# Patient Record
Sex: Female | Born: 1940
Health system: Southern US, Community
[De-identification: ages and names within clinical notes are randomized; demographics above are authoritative.]

## PROBLEM LIST (undated history)

## (undated) DIAGNOSIS — R079 Chest pain, unspecified: Secondary | ICD-10-CM

## (undated) DIAGNOSIS — R7989 Other specified abnormal findings of blood chemistry: Secondary | ICD-10-CM

## (undated) DIAGNOSIS — N189 Chronic kidney disease, unspecified: Secondary | ICD-10-CM

## (undated) DIAGNOSIS — F419 Anxiety disorder, unspecified: Secondary | ICD-10-CM

## (undated) DIAGNOSIS — I739 Peripheral vascular disease, unspecified: Secondary | ICD-10-CM

## (undated) DIAGNOSIS — G629 Polyneuropathy, unspecified: Secondary | ICD-10-CM

## (undated) DIAGNOSIS — E785 Hyperlipidemia, unspecified: Secondary | ICD-10-CM

## (undated) DIAGNOSIS — R0600 Dyspnea, unspecified: Secondary | ICD-10-CM

## (undated) DIAGNOSIS — M2550 Pain in unspecified joint: Secondary | ICD-10-CM

## (undated) DIAGNOSIS — M1990 Unspecified osteoarthritis, unspecified site: Secondary | ICD-10-CM

## (undated) DIAGNOSIS — R51 Headache: Secondary | ICD-10-CM

## (undated) DIAGNOSIS — M79606 Pain in leg, unspecified: Secondary | ICD-10-CM

## (undated) DIAGNOSIS — I10 Essential (primary) hypertension: Secondary | ICD-10-CM

## (undated) HISTORY — PX: TOTAL HIP ARTHROPLASTY: SHX124

## (undated) HISTORY — DX: Pain in leg, unspecified: M79.606

## (undated) HISTORY — DX: Polyneuropathy, unspecified: G62.9

## (undated) HISTORY — DX: Headache: R51

## (undated) HISTORY — DX: Pain in unspecified joint: M25.50

## (undated) HISTORY — PX: BACK SURGERY: SHX140

## (undated) HISTORY — PX: COLONOSCOPY: SHX174

## (undated) HISTORY — DX: Chronic kidney disease, unspecified: N18.9

## (undated) HISTORY — DX: Peripheral vascular disease, unspecified: I73.9

## (undated) HISTORY — DX: Anxiety disorder, unspecified: F41.9

## (undated) HISTORY — DX: Unspecified osteoarthritis, unspecified site: M19.90

## (undated) HISTORY — DX: Hyperlipidemia, unspecified: E78.5

## (undated) HISTORY — PX: ESOPHAGOGASTRODUODENOSCOPY: SHX1529

## (undated) HISTORY — DX: Essential (primary) hypertension: I10

---

## 1998-10-16 ENCOUNTER — Encounter: Payer: Self-pay | Admitting: Emergency Medicine

## 1998-10-16 ENCOUNTER — Emergency Department (HOSPITAL_COMMUNITY): Admission: EM | Admit: 1998-10-16 | Discharge: 1998-10-16 | Payer: Self-pay | Admitting: Internal Medicine

## 1999-12-03 ENCOUNTER — Emergency Department (HOSPITAL_COMMUNITY): Admission: EM | Admit: 1999-12-03 | Discharge: 1999-12-03 | Payer: Self-pay | Admitting: *Deleted

## 1999-12-04 ENCOUNTER — Encounter: Payer: Self-pay | Admitting: *Deleted

## 1999-12-15 ENCOUNTER — Emergency Department (HOSPITAL_COMMUNITY): Admission: EM | Admit: 1999-12-15 | Discharge: 1999-12-15 | Payer: Self-pay | Admitting: Emergency Medicine

## 2000-09-04 ENCOUNTER — Emergency Department (HOSPITAL_COMMUNITY): Admission: EM | Admit: 2000-09-04 | Discharge: 2000-09-04 | Payer: Self-pay | Admitting: Emergency Medicine

## 2000-09-04 ENCOUNTER — Encounter: Payer: Self-pay | Admitting: Emergency Medicine

## 2001-02-17 ENCOUNTER — Encounter: Payer: Self-pay | Admitting: Emergency Medicine

## 2001-02-17 ENCOUNTER — Emergency Department (HOSPITAL_COMMUNITY): Admission: EM | Admit: 2001-02-17 | Discharge: 2001-02-17 | Payer: Self-pay | Admitting: Emergency Medicine

## 2001-03-20 ENCOUNTER — Encounter: Payer: Self-pay | Admitting: Cardiology

## 2001-03-20 ENCOUNTER — Ambulatory Visit (HOSPITAL_COMMUNITY): Admission: RE | Admit: 2001-03-20 | Discharge: 2001-03-20 | Payer: Self-pay | Admitting: Cardiology

## 2001-03-29 ENCOUNTER — Other Ambulatory Visit: Admission: RE | Admit: 2001-03-29 | Discharge: 2001-03-29 | Payer: Self-pay | Admitting: *Deleted

## 2001-06-07 ENCOUNTER — Encounter: Payer: Self-pay | Admitting: Orthopedic Surgery

## 2001-06-07 ENCOUNTER — Ambulatory Visit (HOSPITAL_COMMUNITY): Admission: RE | Admit: 2001-06-07 | Discharge: 2001-06-07 | Payer: Self-pay | Admitting: Orthopedic Surgery

## 2001-07-18 ENCOUNTER — Emergency Department (HOSPITAL_COMMUNITY): Admission: EM | Admit: 2001-07-18 | Discharge: 2001-07-18 | Payer: Self-pay | Admitting: Emergency Medicine

## 2001-07-18 ENCOUNTER — Encounter: Payer: Self-pay | Admitting: Emergency Medicine

## 2001-08-10 ENCOUNTER — Encounter: Payer: Self-pay | Admitting: Obstetrics

## 2001-08-10 ENCOUNTER — Ambulatory Visit (HOSPITAL_COMMUNITY): Admission: RE | Admit: 2001-08-10 | Discharge: 2001-08-10 | Payer: Self-pay | Admitting: Obstetrics

## 2001-09-07 ENCOUNTER — Encounter: Payer: Self-pay | Admitting: Orthopedic Surgery

## 2001-09-07 ENCOUNTER — Inpatient Hospital Stay (HOSPITAL_COMMUNITY): Admission: RE | Admit: 2001-09-07 | Discharge: 2001-09-11 | Payer: Self-pay | Admitting: Orthopedic Surgery

## 2001-09-11 ENCOUNTER — Inpatient Hospital Stay (HOSPITAL_COMMUNITY)
Admission: RE | Admit: 2001-09-11 | Discharge: 2001-09-16 | Payer: Self-pay | Admitting: Physical Medicine & Rehabilitation

## 2001-09-22 ENCOUNTER — Emergency Department (HOSPITAL_COMMUNITY): Admission: EM | Admit: 2001-09-22 | Discharge: 2001-09-22 | Payer: Self-pay | Admitting: Emergency Medicine

## 2001-09-30 ENCOUNTER — Inpatient Hospital Stay (HOSPITAL_COMMUNITY): Admission: EM | Admit: 2001-09-30 | Discharge: 2001-10-01 | Payer: Self-pay

## 2001-10-25 ENCOUNTER — Encounter: Payer: Self-pay | Admitting: Orthopedic Surgery

## 2001-10-25 ENCOUNTER — Observation Stay (HOSPITAL_COMMUNITY): Admission: RE | Admit: 2001-10-25 | Discharge: 2001-10-26 | Payer: Self-pay | Admitting: Orthopedic Surgery

## 2003-02-01 ENCOUNTER — Encounter: Payer: Self-pay | Admitting: Emergency Medicine

## 2003-02-01 ENCOUNTER — Emergency Department (HOSPITAL_COMMUNITY): Admission: EM | Admit: 2003-02-01 | Discharge: 2003-02-02 | Payer: Self-pay

## 2003-03-01 ENCOUNTER — Encounter: Payer: Self-pay | Admitting: Emergency Medicine

## 2003-03-01 ENCOUNTER — Emergency Department (HOSPITAL_COMMUNITY): Admission: EM | Admit: 2003-03-01 | Discharge: 2003-03-01 | Payer: Self-pay | Admitting: Emergency Medicine

## 2003-05-17 ENCOUNTER — Emergency Department (HOSPITAL_COMMUNITY): Admission: EM | Admit: 2003-05-17 | Discharge: 2003-05-17 | Payer: Self-pay

## 2003-05-17 ENCOUNTER — Encounter: Payer: Self-pay | Admitting: Emergency Medicine

## 2003-06-27 ENCOUNTER — Encounter: Payer: Self-pay | Admitting: Orthopedic Surgery

## 2003-06-27 ENCOUNTER — Ambulatory Visit (HOSPITAL_COMMUNITY): Admission: RE | Admit: 2003-06-27 | Discharge: 2003-06-27 | Payer: Self-pay | Admitting: Orthopedic Surgery

## 2003-11-28 ENCOUNTER — Inpatient Hospital Stay (HOSPITAL_COMMUNITY): Admission: RE | Admit: 2003-11-28 | Discharge: 2003-12-06 | Payer: Self-pay | Admitting: Orthopedic Surgery

## 2003-12-07 ENCOUNTER — Inpatient Hospital Stay (HOSPITAL_COMMUNITY): Admission: EM | Admit: 2003-12-07 | Discharge: 2003-12-10 | Payer: Self-pay | Admitting: Emergency Medicine

## 2003-12-15 ENCOUNTER — Emergency Department (HOSPITAL_COMMUNITY): Admission: EM | Admit: 2003-12-15 | Discharge: 2003-12-15 | Payer: Self-pay | Admitting: Emergency Medicine

## 2004-01-28 ENCOUNTER — Encounter: Admission: RE | Admit: 2004-01-28 | Discharge: 2004-02-25 | Payer: Self-pay | Admitting: Orthopedic Surgery

## 2004-05-02 ENCOUNTER — Emergency Department (HOSPITAL_COMMUNITY): Admission: EM | Admit: 2004-05-02 | Discharge: 2004-05-02 | Payer: Self-pay | Admitting: Emergency Medicine

## 2004-06-03 ENCOUNTER — Ambulatory Visit (HOSPITAL_COMMUNITY): Admission: RE | Admit: 2004-06-03 | Discharge: 2004-06-03 | Payer: Self-pay | Admitting: Orthopedic Surgery

## 2004-07-25 ENCOUNTER — Emergency Department (HOSPITAL_COMMUNITY): Admission: EM | Admit: 2004-07-25 | Discharge: 2004-07-25 | Payer: Self-pay | Admitting: Emergency Medicine

## 2004-08-27 ENCOUNTER — Ambulatory Visit (HOSPITAL_COMMUNITY): Admission: RE | Admit: 2004-08-27 | Discharge: 2004-08-27 | Payer: Self-pay | Admitting: Orthopedic Surgery

## 2004-10-19 ENCOUNTER — Emergency Department (HOSPITAL_COMMUNITY): Admission: EM | Admit: 2004-10-19 | Discharge: 2004-10-19 | Payer: Self-pay | Admitting: Family Medicine

## 2004-11-03 ENCOUNTER — Ambulatory Visit (HOSPITAL_COMMUNITY): Admission: RE | Admit: 2004-11-03 | Discharge: 2004-11-03 | Payer: Self-pay | Admitting: Orthopedic Surgery

## 2004-12-29 ENCOUNTER — Inpatient Hospital Stay (HOSPITAL_COMMUNITY): Admission: EM | Admit: 2004-12-29 | Discharge: 2005-01-02 | Payer: Self-pay | Admitting: Emergency Medicine

## 2005-01-03 IMAGING — CR DG CHEST 2V
2 series · 2 of 2 positions shown · non-contrast
Comparison: 07/18/01.

CLINICAL DATA: 62 year old, loose right total hip. Pre-admit.
 TWO VIEW CHEST

[view not recorded (1 of 2)]
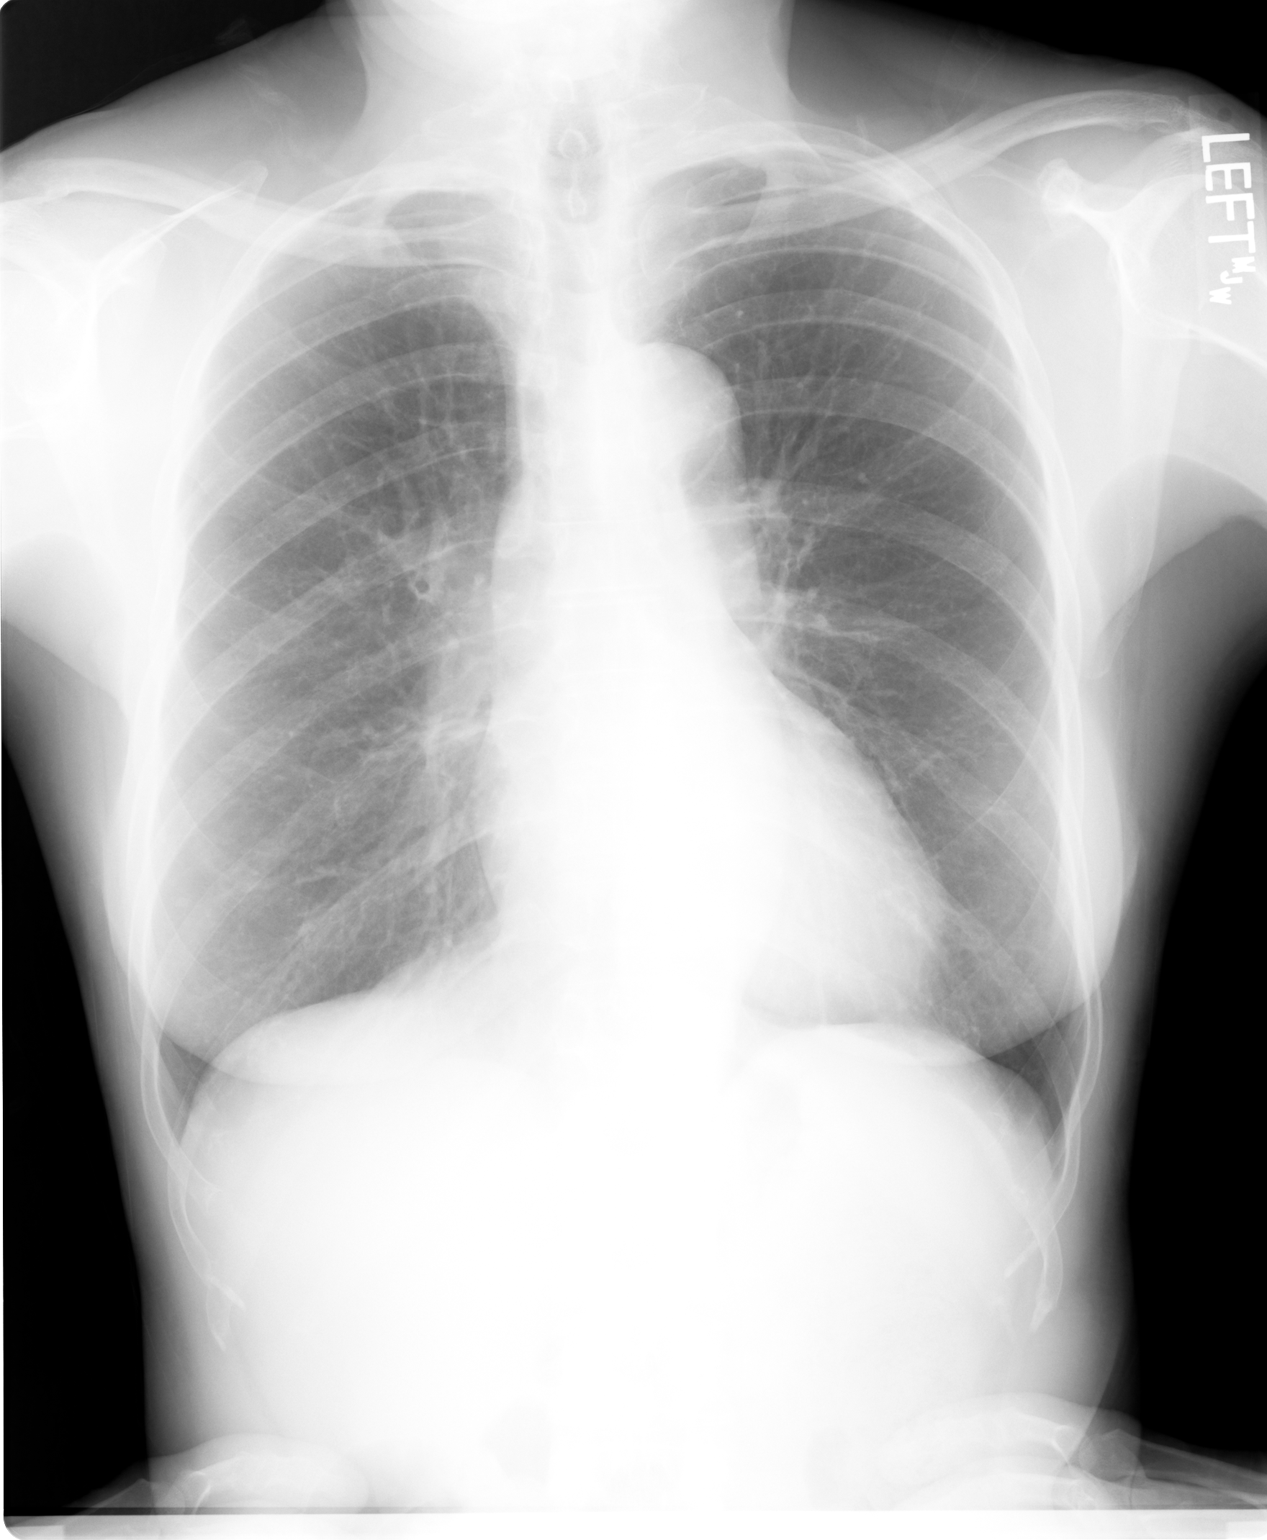

[view not recorded (2 of 2)]
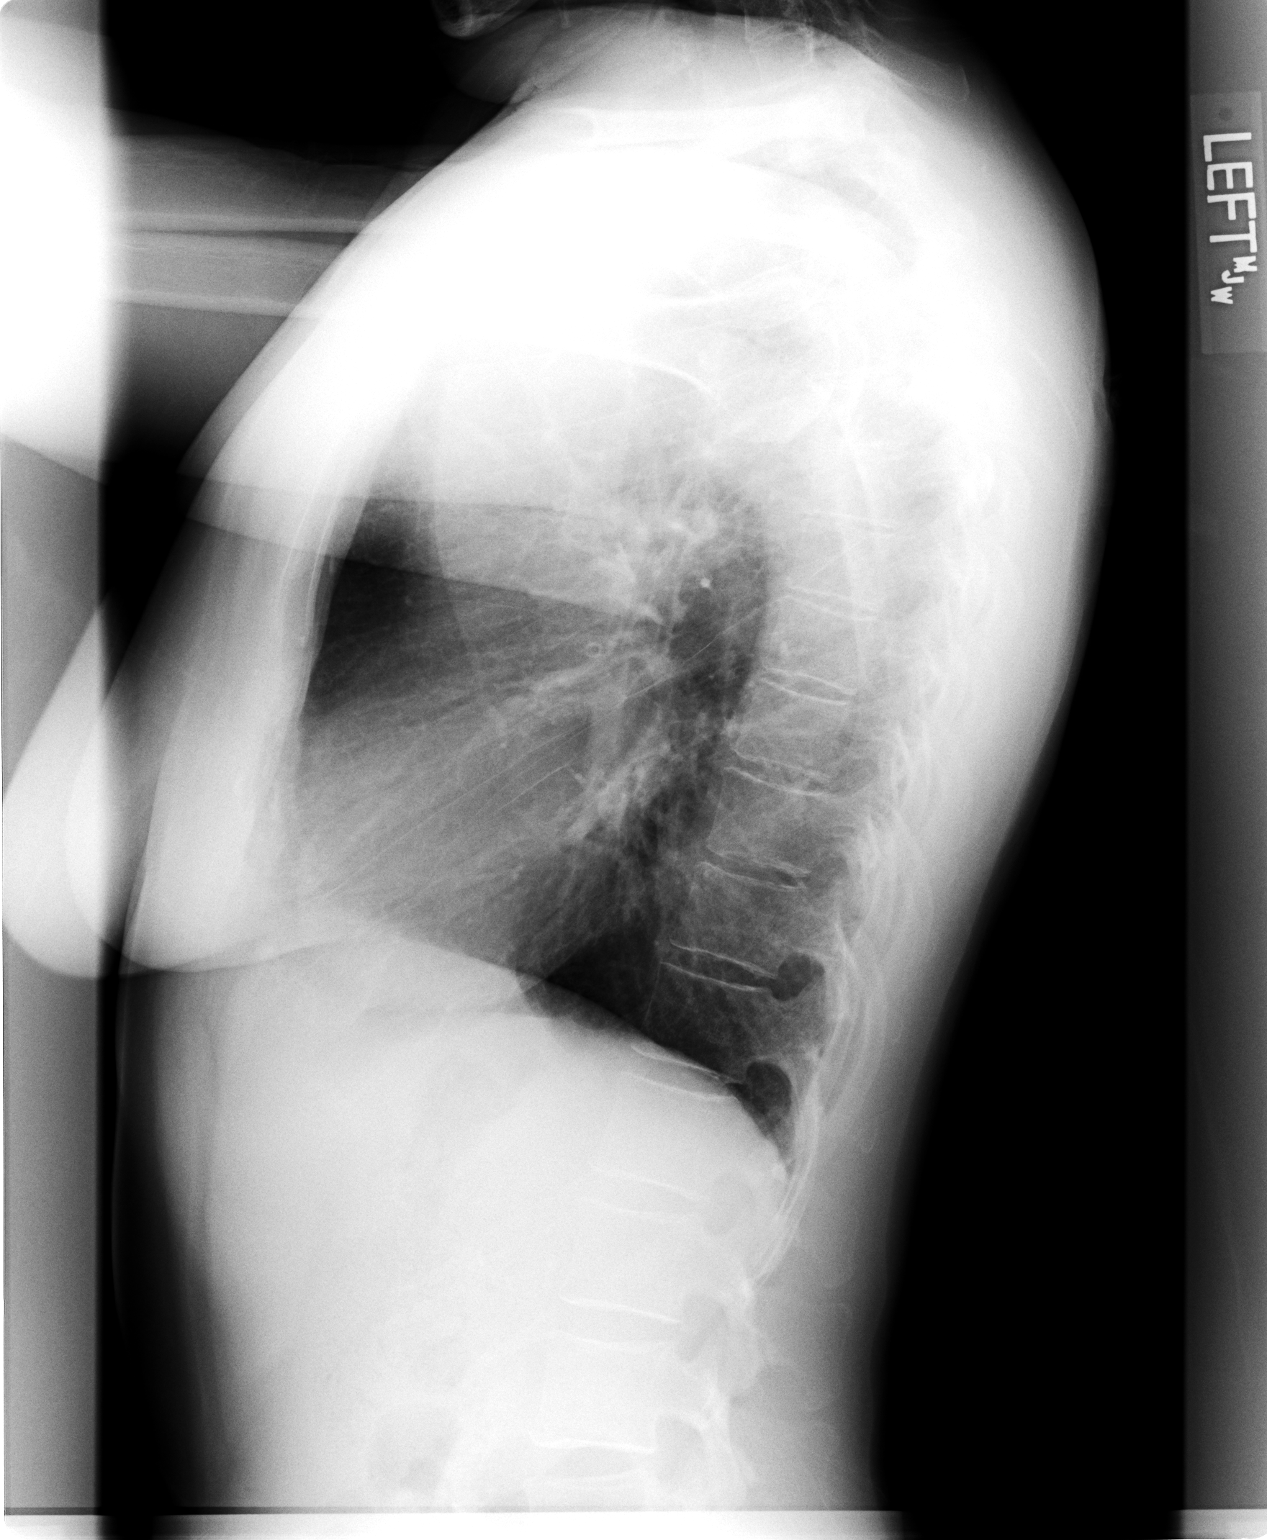

[2 of 2 positions shown; findings below may reference images not displayed]

Cardiomediastinal silhouette is within normal limits. Lungs are mildly hyperinflated.  There is mild perihilar and peribronchial thickening consistent with bronchitis.  Lungs are clear. 
 IMPRESSION 
 Changes of bronchitis without evidence for acute pulmonary abnormality.

## 2005-01-05 IMAGING — CR DG HIP COMPLETE 2+V*R*
3 series · 3 of 3 positions shown · non-contrast
Comparison: 03/01/03.

CLINICAL DATA: 62 year old with loose right total hip.

[view not recorded (1 of 3)]
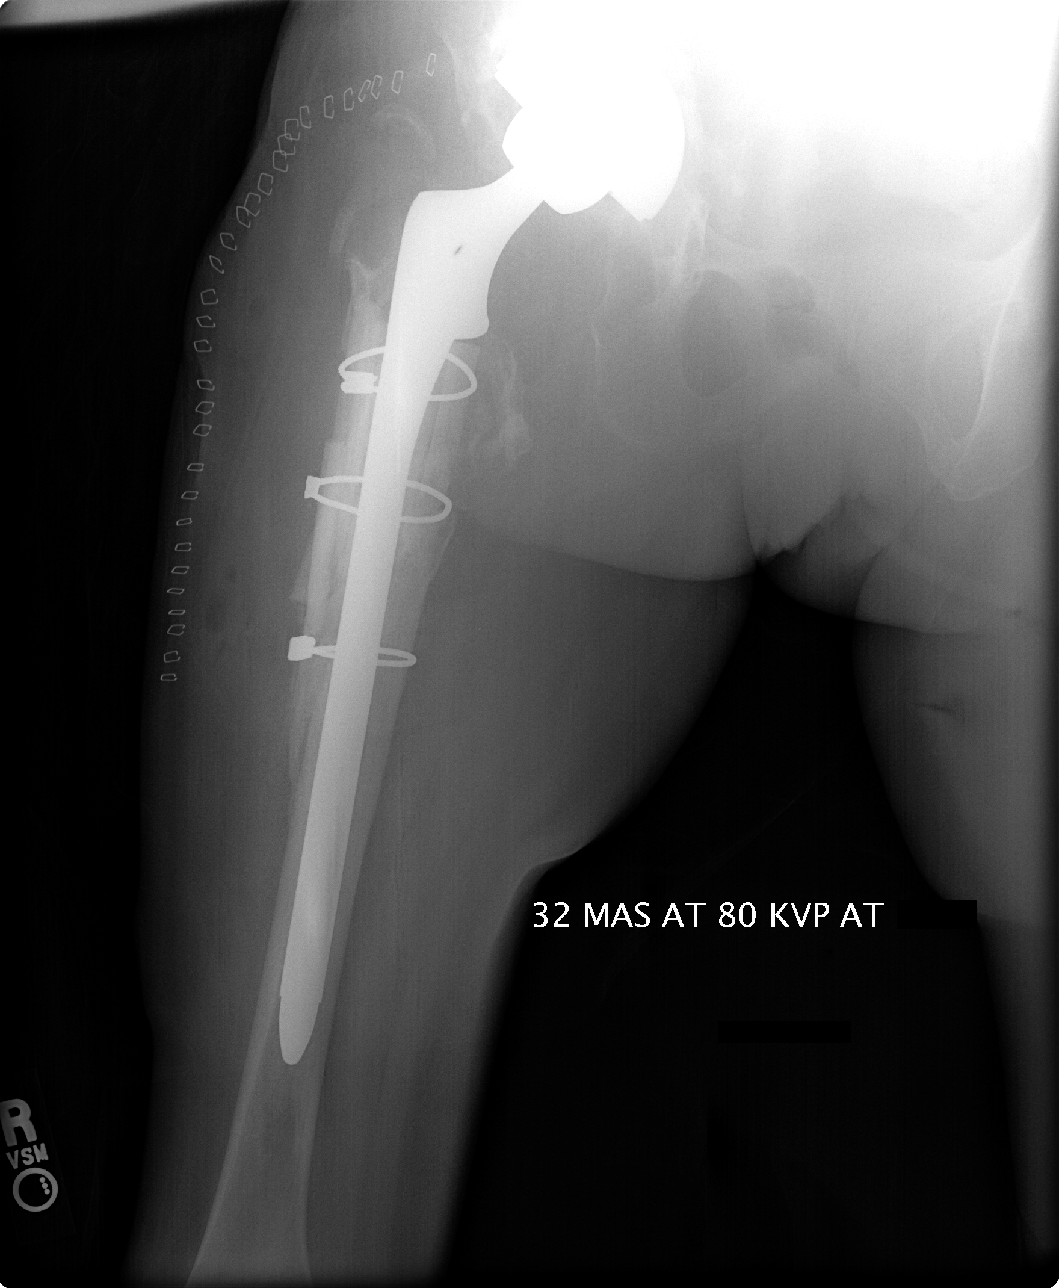

[view not recorded (2 of 3)]
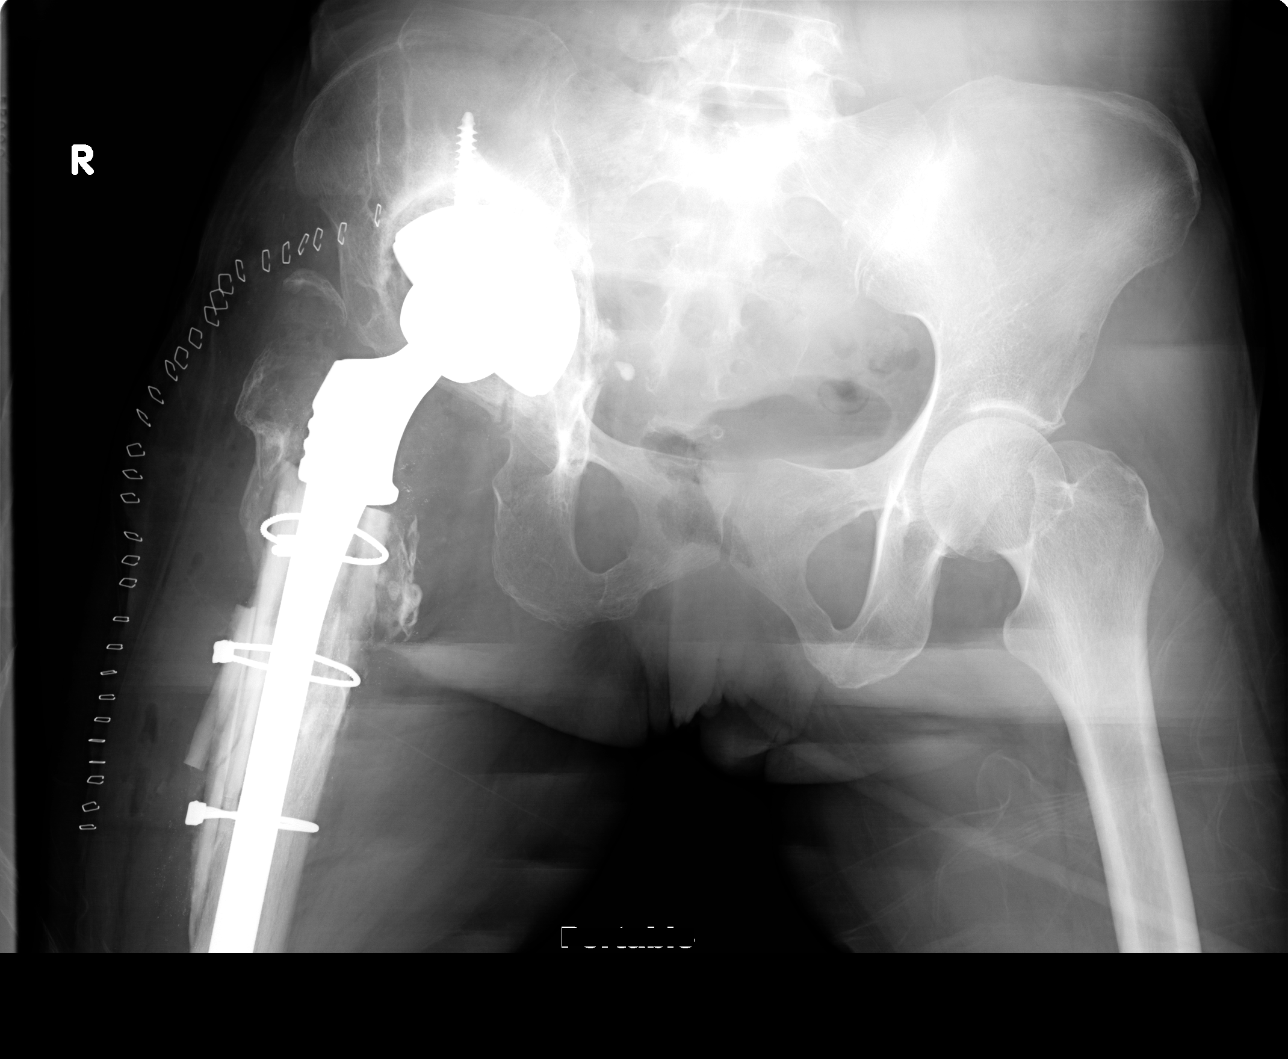

[view not recorded (3 of 3)]
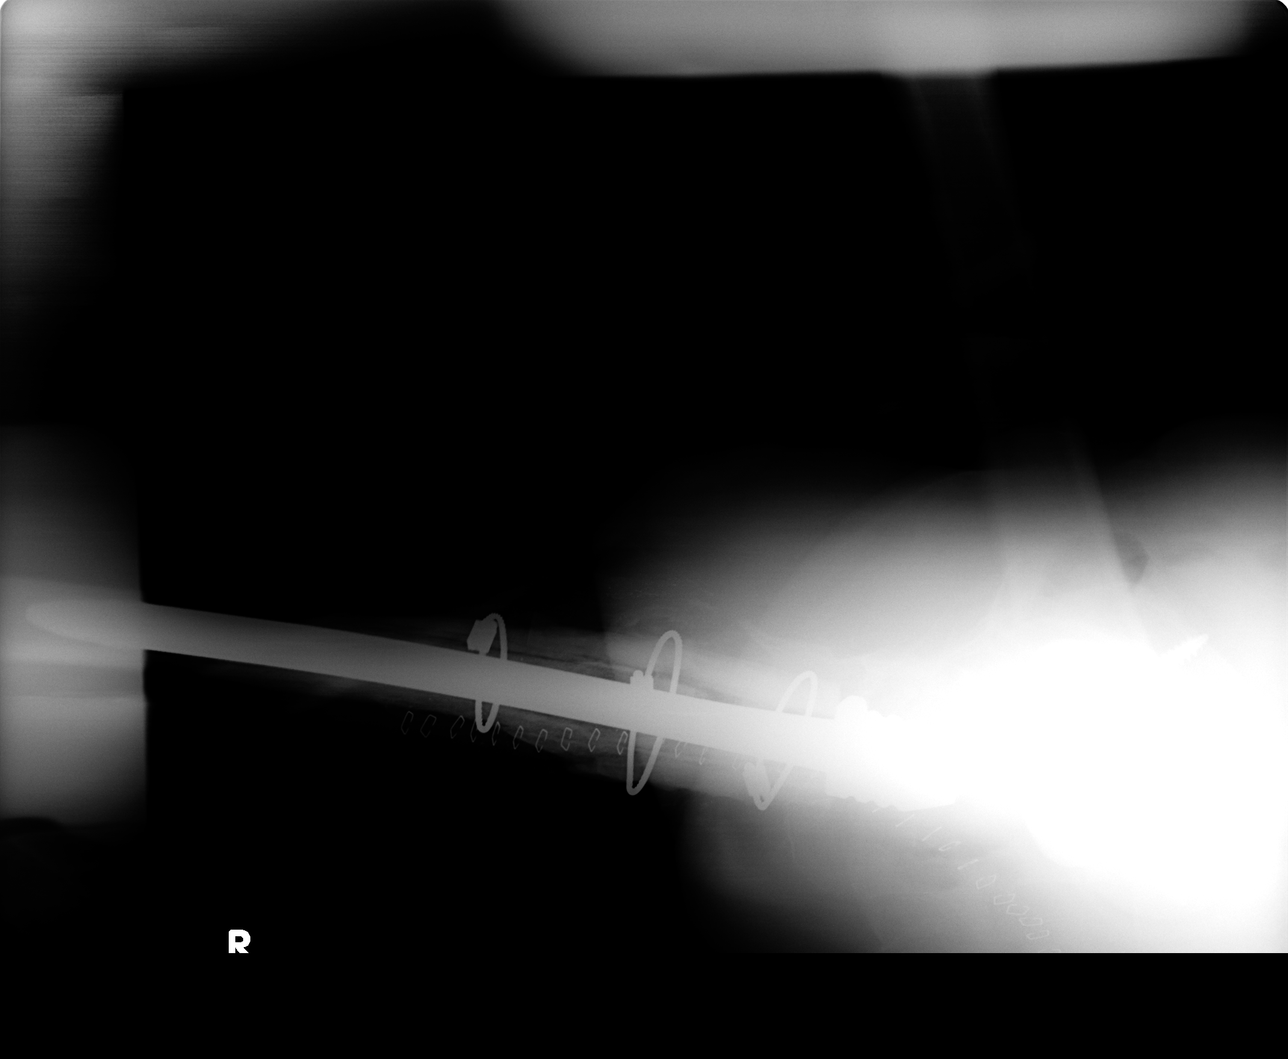

[3 of 3 positions shown; findings below may reference images not displayed]

RIGHT INTRAOPERATIVE HIP ? 3784 HOURS
 There has been right total hip arthroplasty in dysplastic right hip.  Hardware has been replaced since the prior exam.  Cerclage wires are seen around the proximal right femur.  No definite evidence for acute fracture or dislocation.
IMPRESSION: Status post right total hip replacement.
 PORTABLE RIGHT HIP STUDY ? 4843 HOURS
 Crosstable lateral film was performed, showing the femoral head prosthesis to lie within the acetabular prosthesis.
IMPRESSION: No evidence for dislocation on the lateral film.
 RIGHT HIP STUDY ? 4626 HOURS 
 The patient has had replacement of right total hip arthroplasty.  There is no evidence for dislocation or acute fracture.  Cerclage wires are in place about the proximal femur.
IMPRESSION: Status post total hip replacement.

## 2005-01-07 ENCOUNTER — Encounter: Admission: RE | Admit: 2005-01-07 | Discharge: 2005-01-07 | Payer: Self-pay | Admitting: Cardiology

## 2005-01-10 IMAGING — CR DG HIP COMPLETE 2+V*R*
2 series · 2 of 2 positions shown · non-contrast
Comparison: 11/28/03.

CLINICAL DATA: Loose right total hip.  Please evaluate. 
 RIGHT HIP, COMPLETE ? 12/03/03

[view not recorded (1 of 2)]
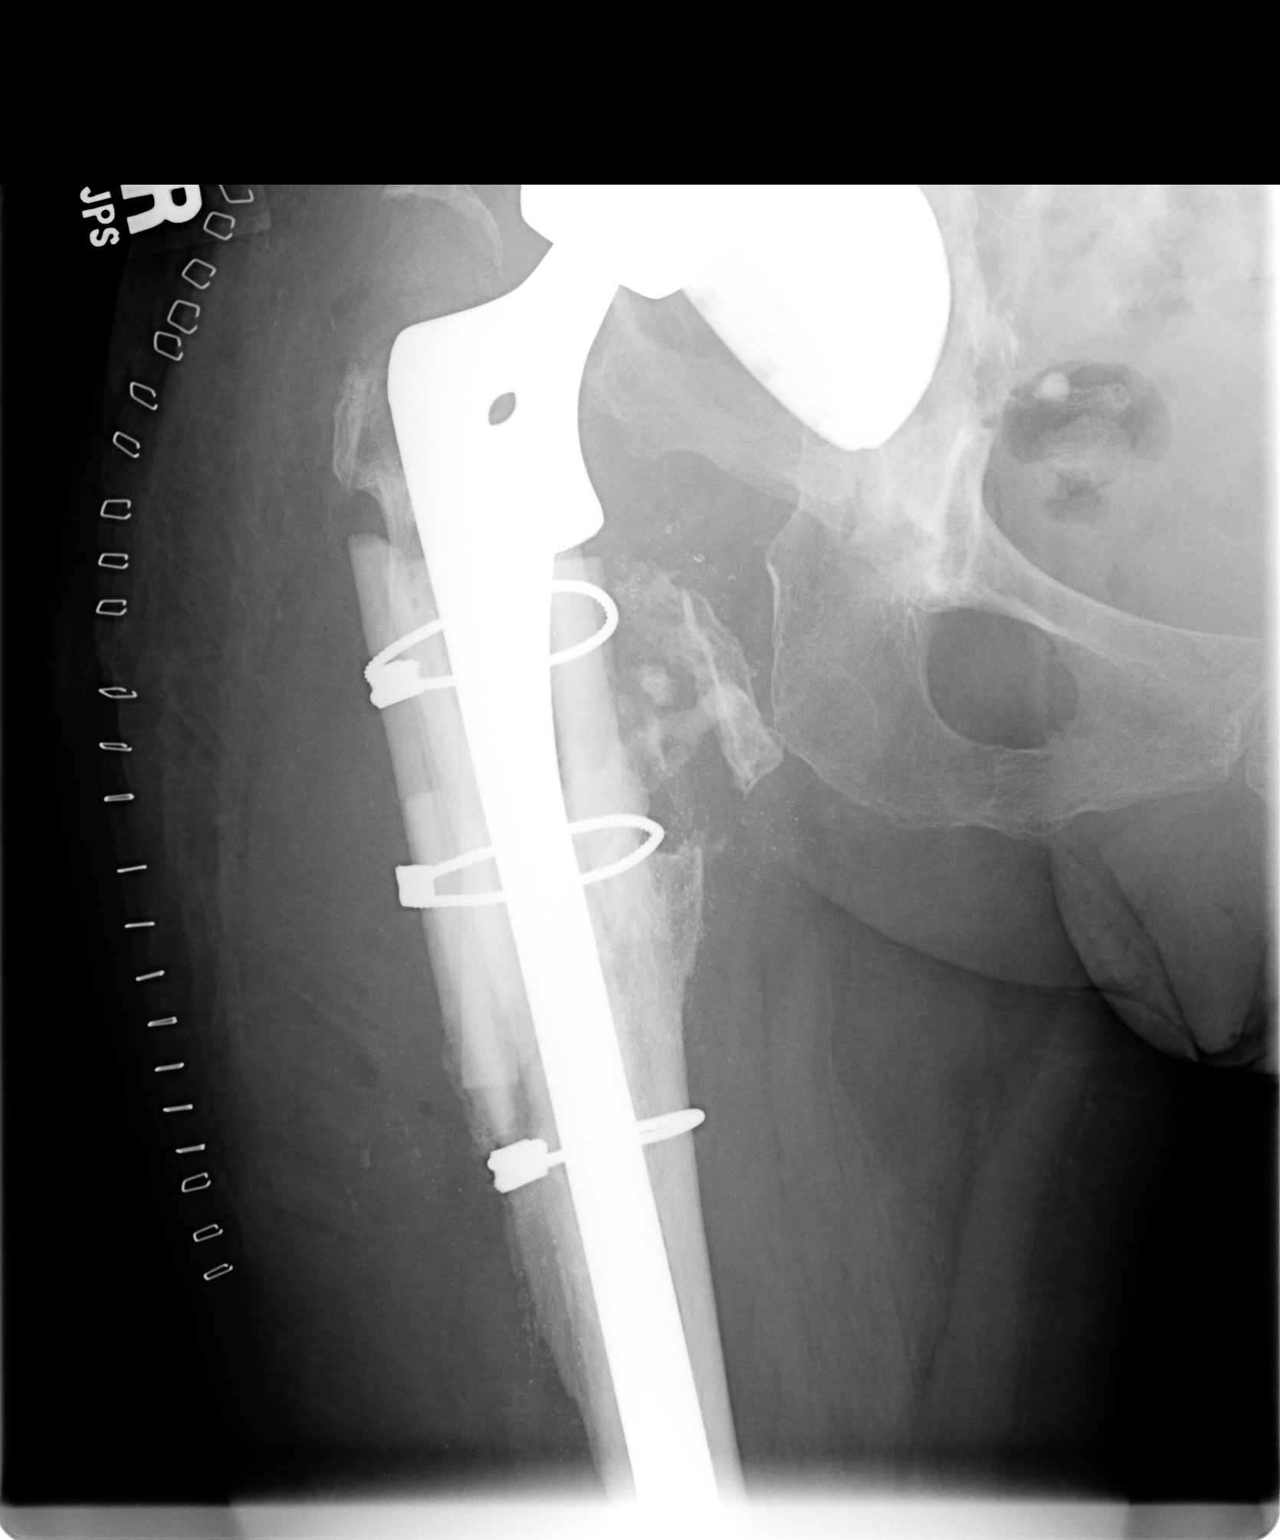

[view not recorded (2 of 2)]
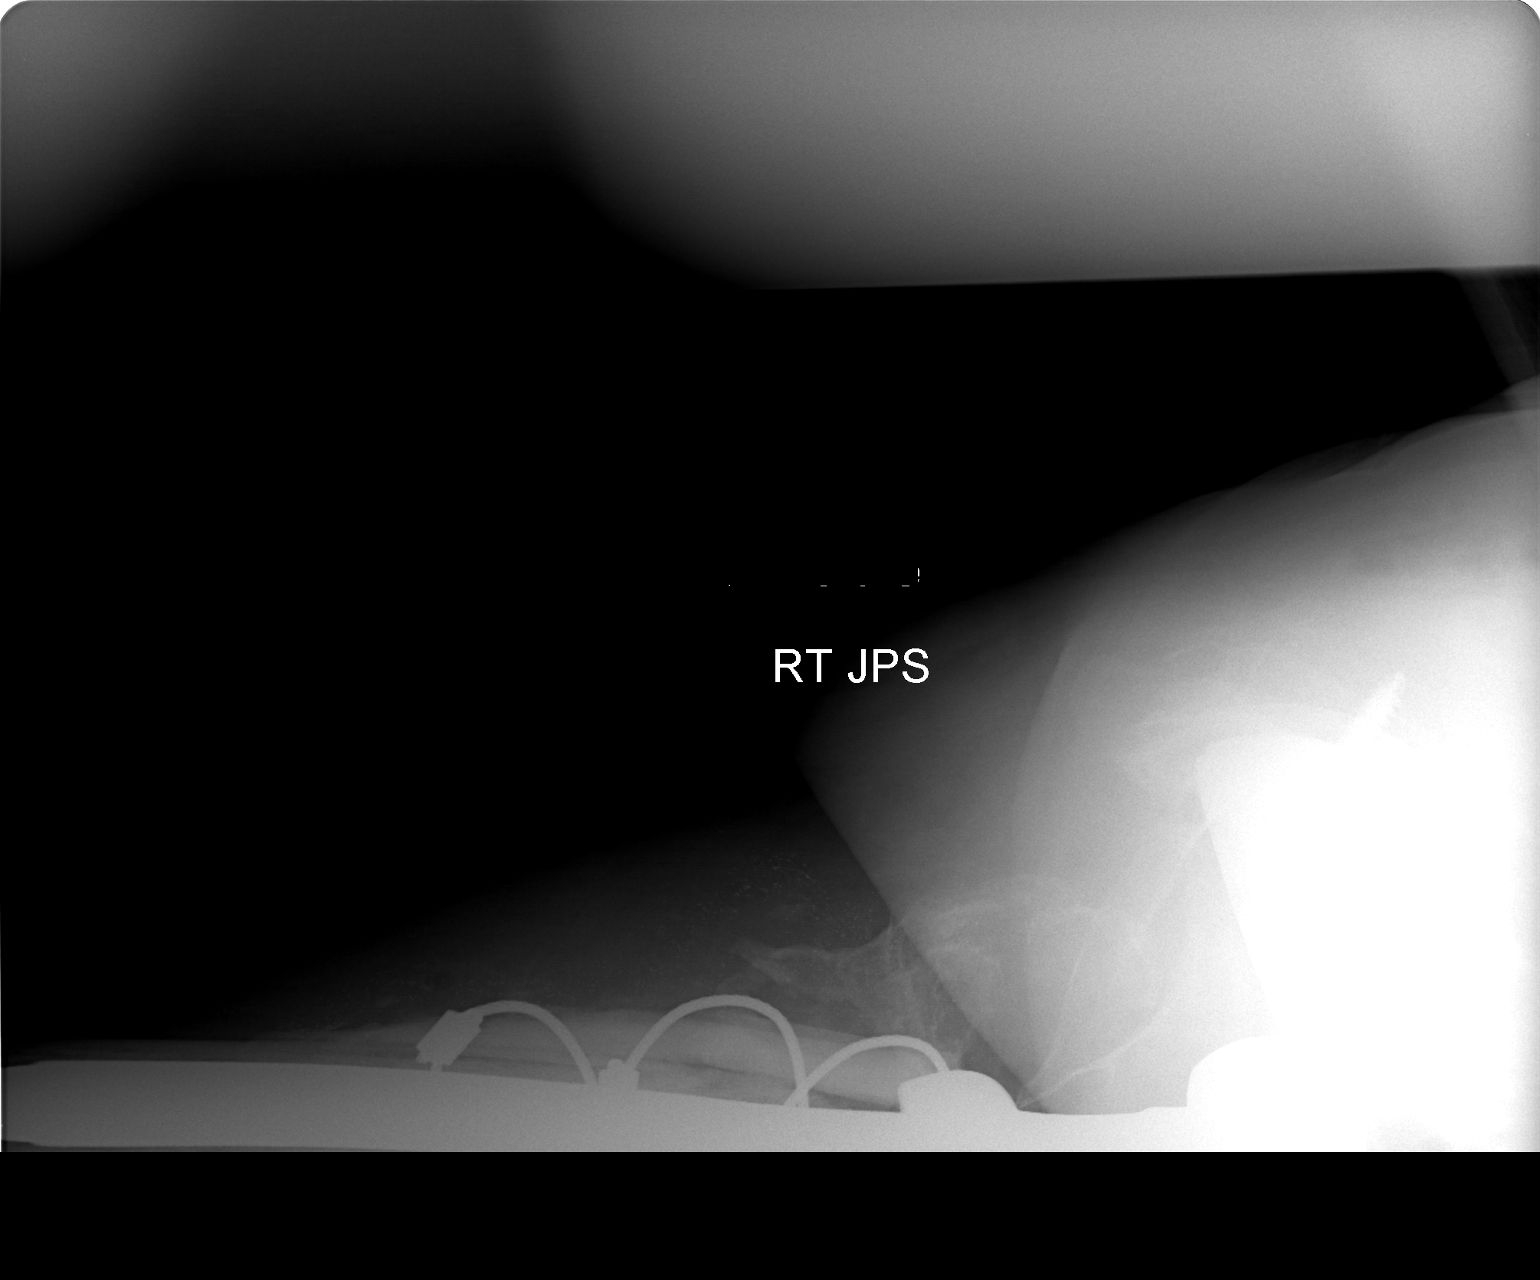

[2 of 2 positions shown; findings below may reference images not displayed]

The femoral component of the right total hip prosthesis has dislocated superiorly and posteriorly when compared with the previous study.  There is a long intramedullary rod, which is not fully included on this study.
IMPRESSION: Superior and posterior dislocation of the femoral component of the right total hip prosthesis.

## 2005-01-14 IMAGING — CR DG FEMUR 2+V*R*
2 series · 2 of 2 positions shown · non-contrast
Comparison: none

CLINICAL DATA: Pain. 
 PA AND LATERAL VIEWS OF THE DISTAL RIGHT FEMUR  
 There is a spiral fracture of the distal right femoral shaft extending from the tip of the long stem prosthesis.  No significant displacement. 
 IMPRESSION 
 There is a nondisplaced distal femoral shaft fracture.

[view not recorded (1 of 2)]
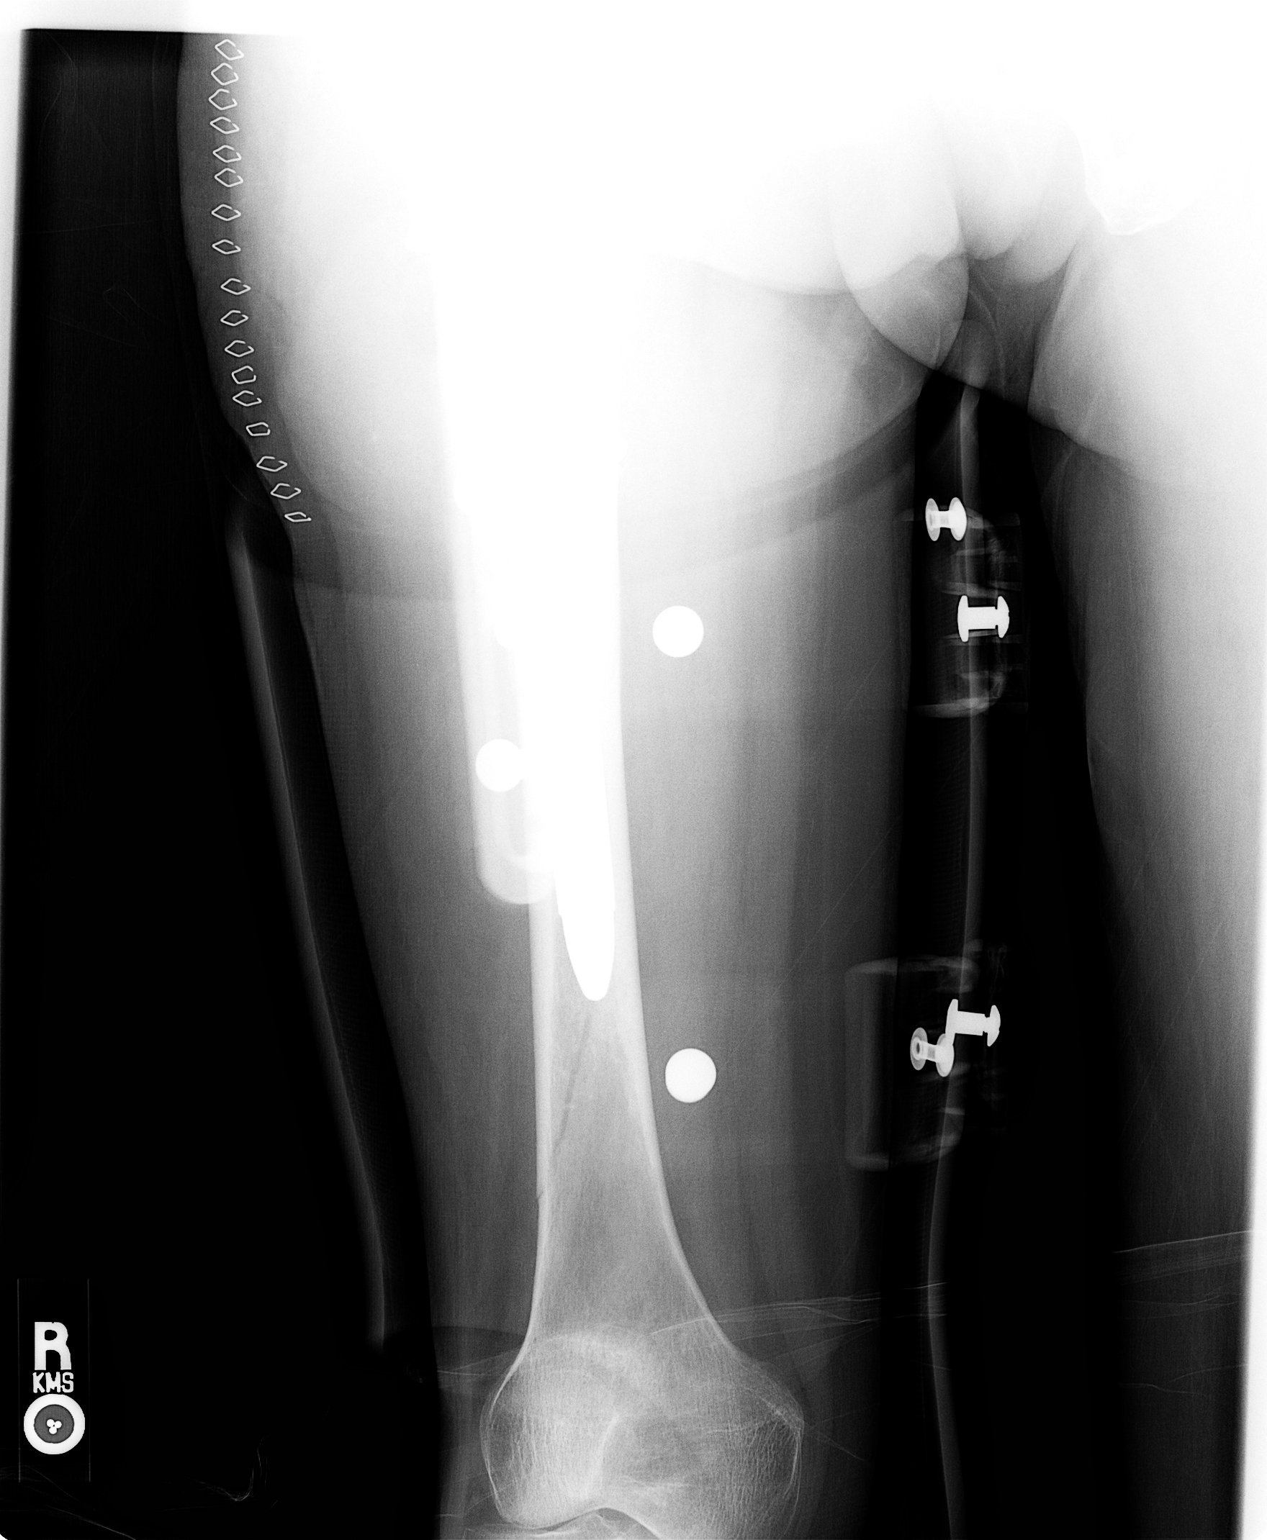

[view not recorded (2 of 2)]
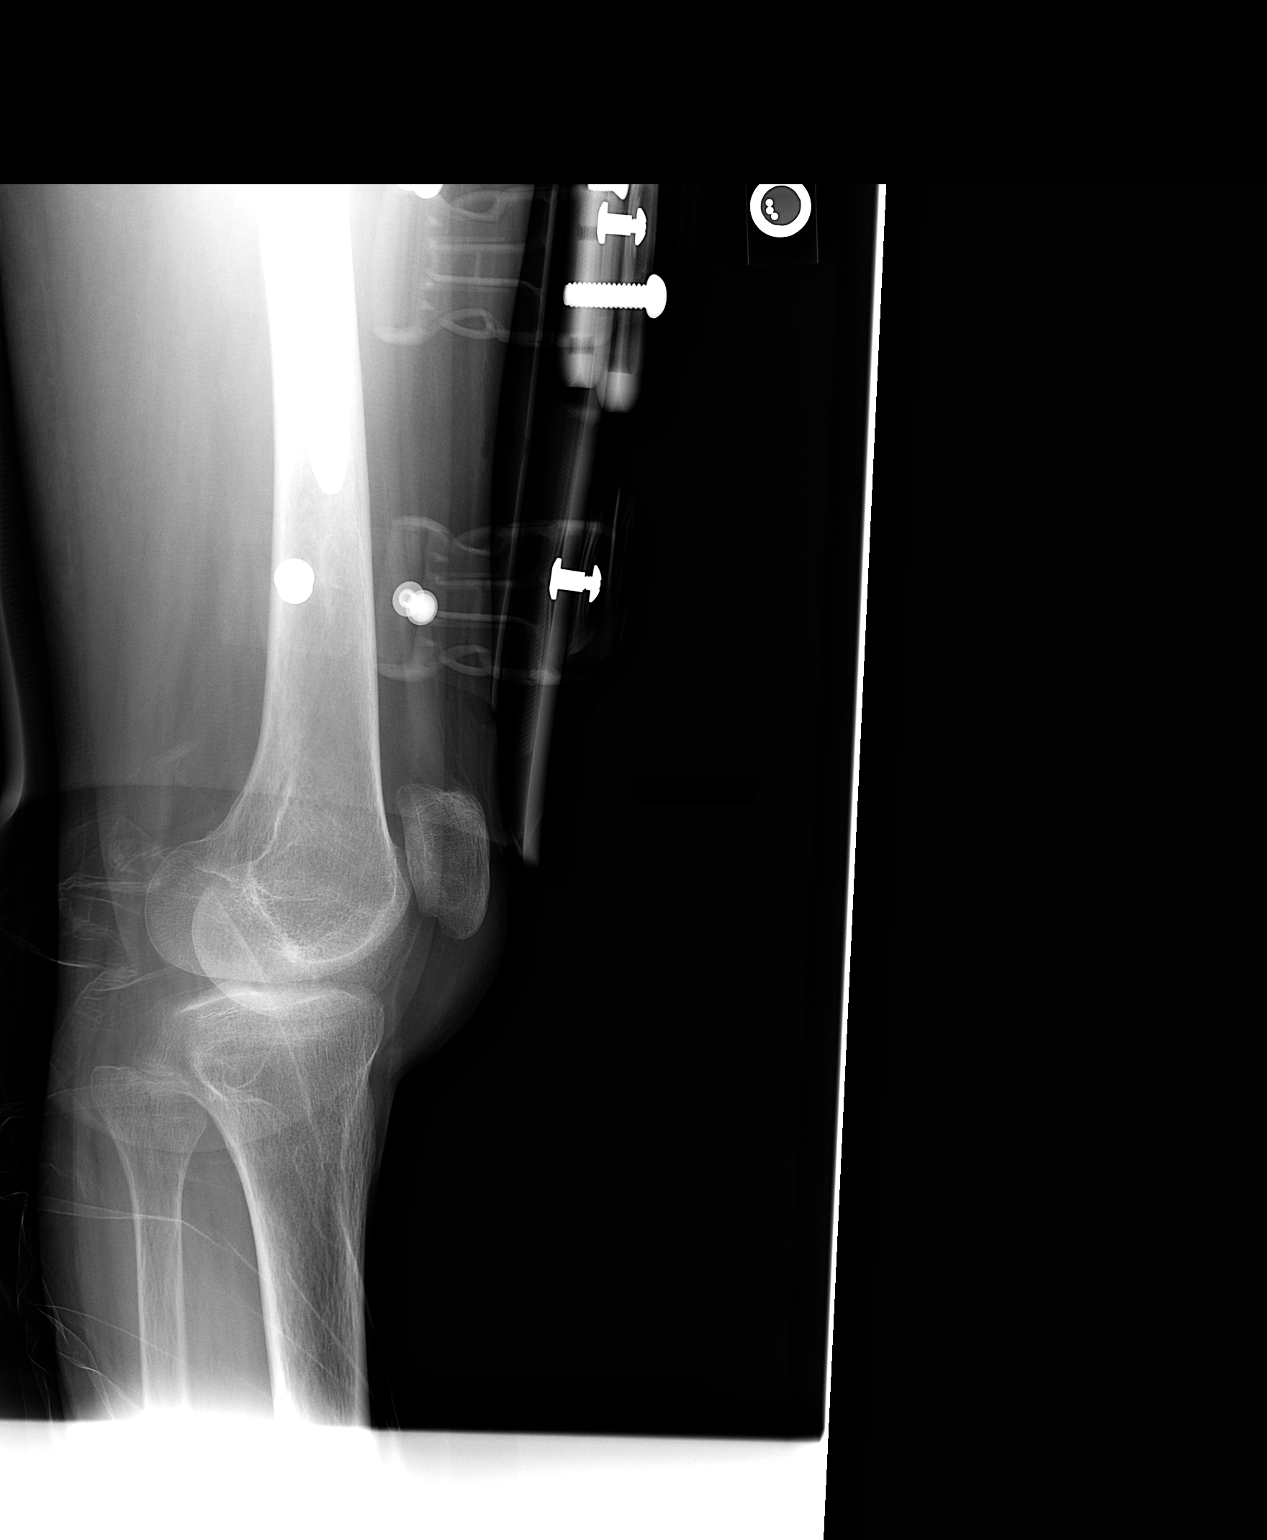

[2 of 2 positions shown; findings below may reference images not displayed]

## 2005-01-14 IMAGING — CR DG HIP COMPLETE 2+V*R*
4 series · 4 of 4 positions shown · non-contrast
Comparison: none

CLINICAL DATA: Hip pain.  
 RIGHT HIP (TWO VIEWS) 
 The patient has a long stem femoral prosthesis.  The acetabular prosthesis is unchanged in position since the prior exam of 12/03/03.  There are multiple cerclage wires around the proximal femur.  On the AP view, there is a lucency in the femur just below the tip of the stem which was not visible on the prior exam of 11/28/03.  I recommend films of the distal femur for further evaluation.  
 IMPRESSION
 Right total hip replacement appears unchanged.  However, suspect the patient may have a distal femoral fracture.

[view not recorded (1 of 4)]
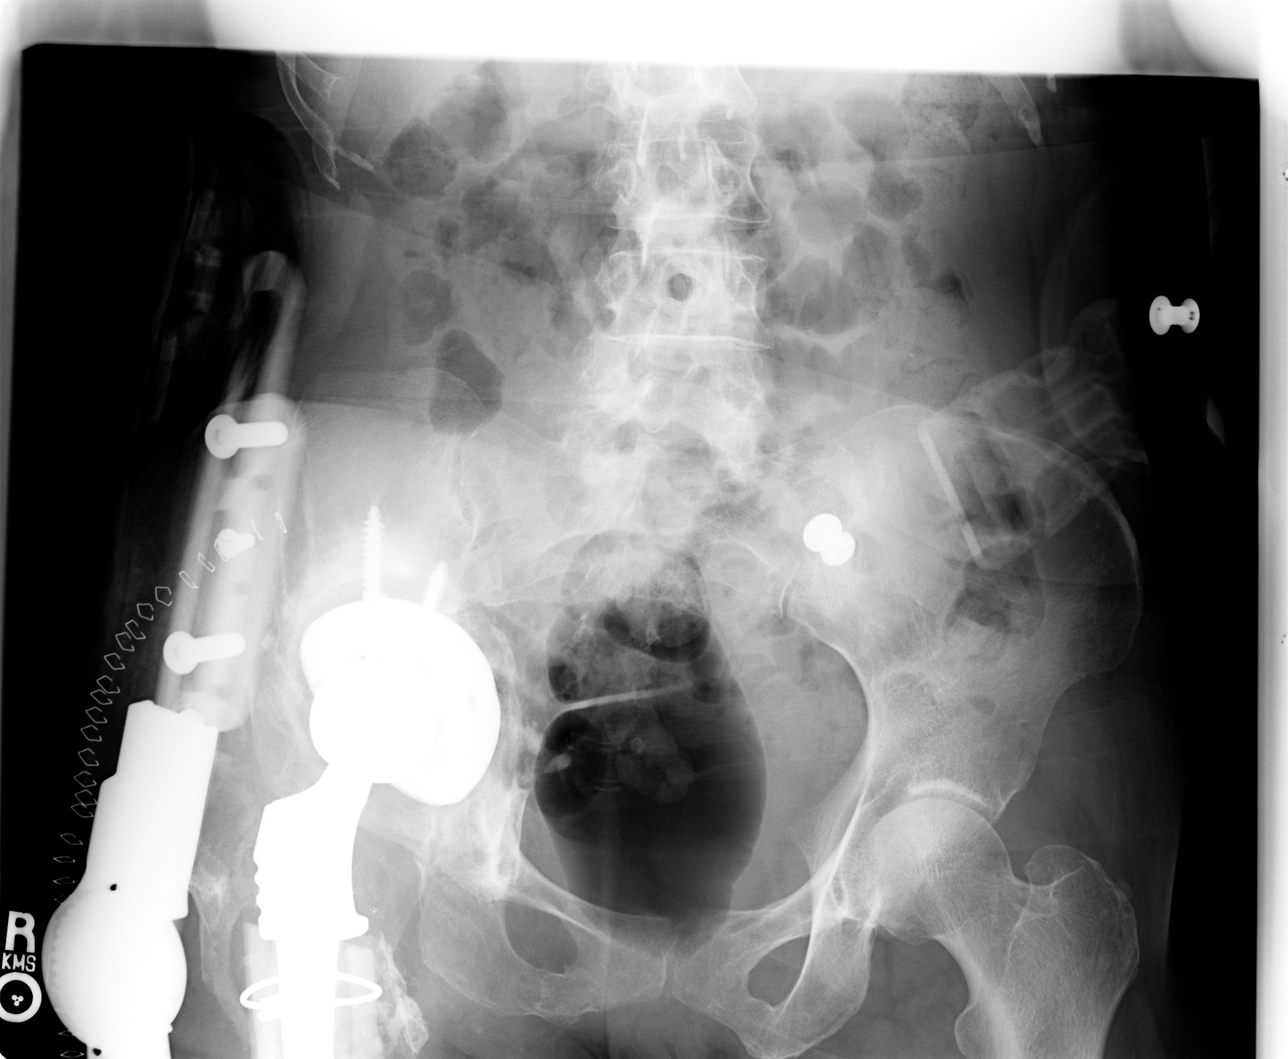

[view not recorded (2 of 4)]
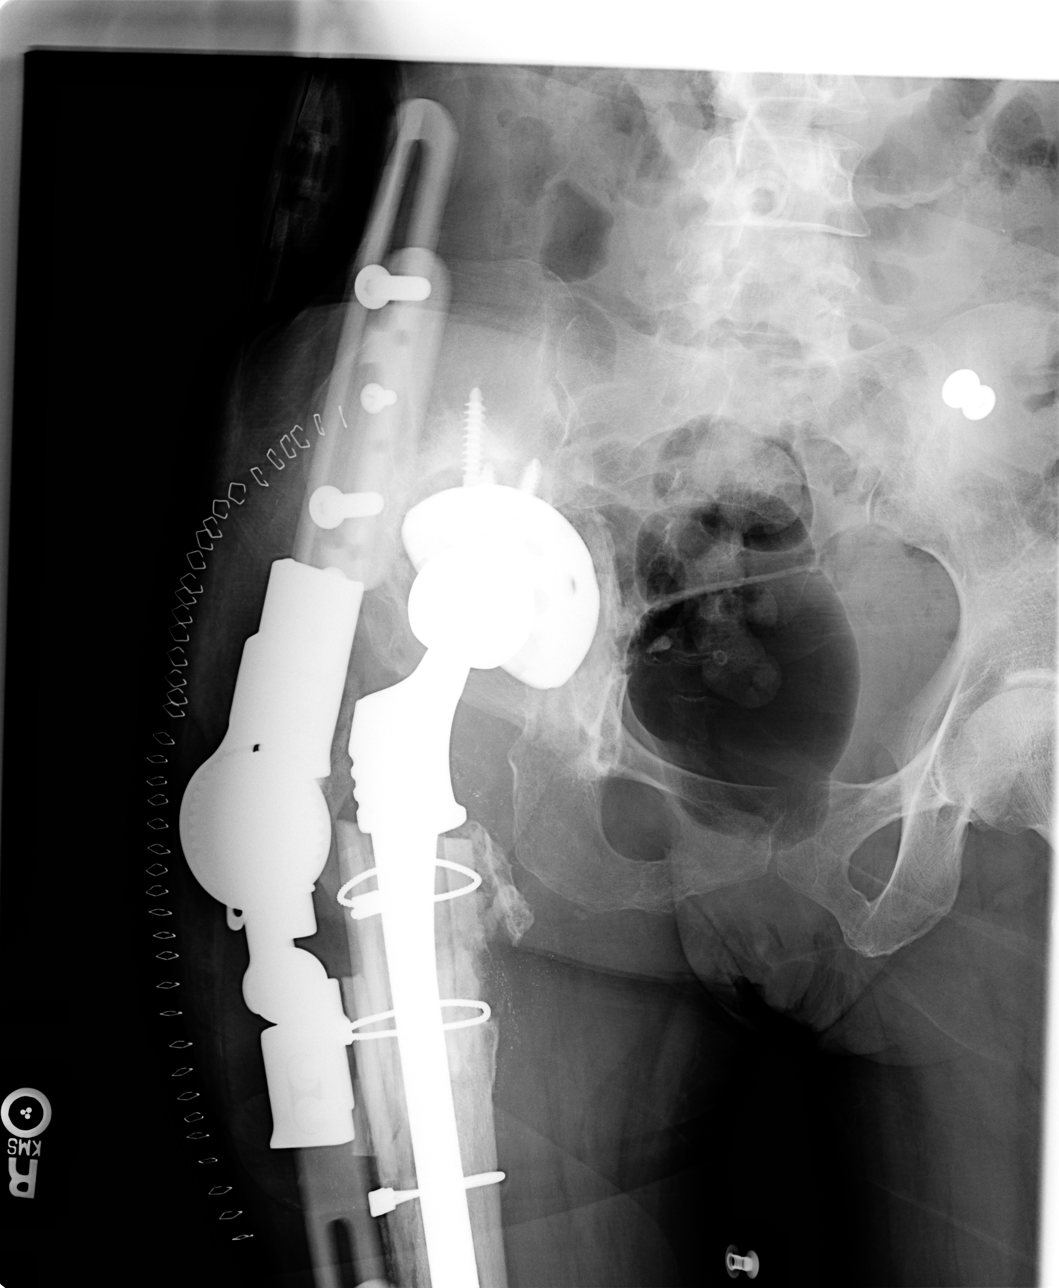

[view not recorded (3 of 4)]
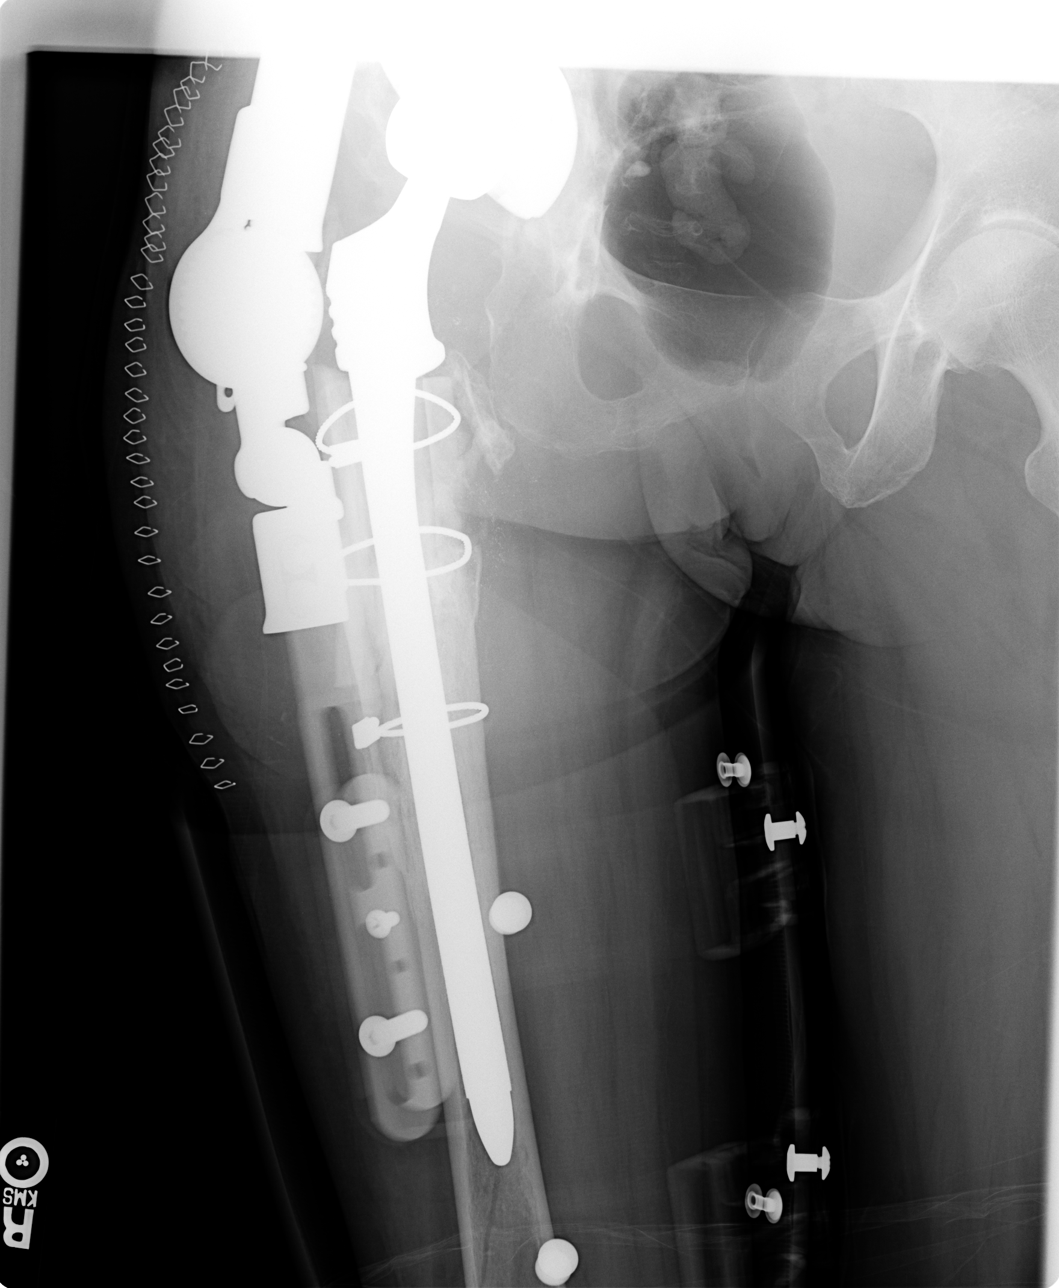

[view not recorded (4 of 4)]
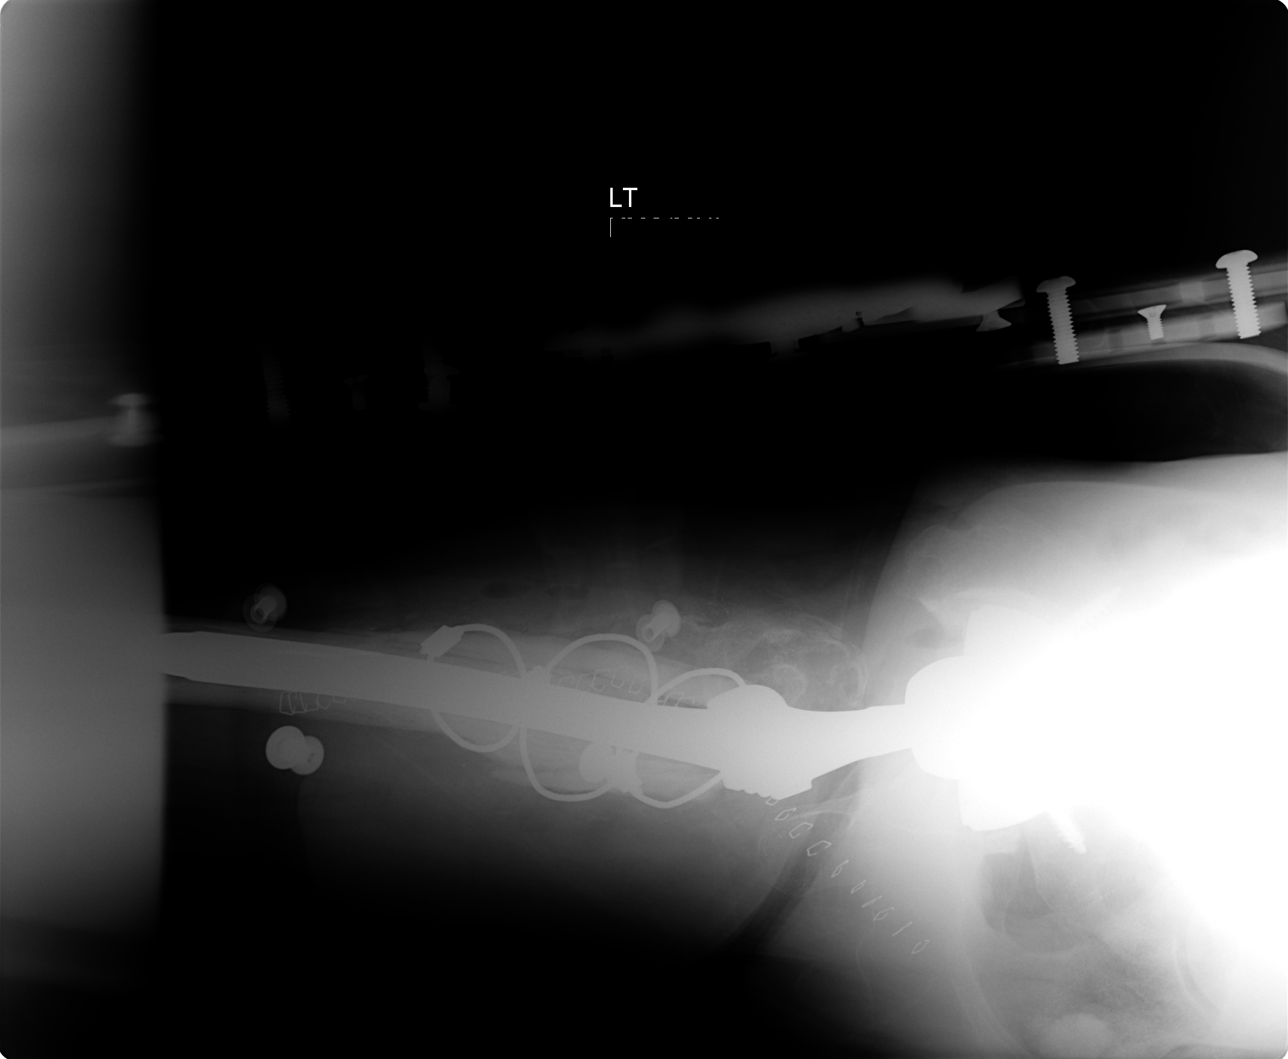

[4 of 4 positions shown; findings below may reference images not displayed]

## 2005-03-13 ENCOUNTER — Emergency Department (HOSPITAL_COMMUNITY): Admission: EM | Admit: 2005-03-13 | Discharge: 2005-03-13 | Payer: Self-pay | Admitting: Emergency Medicine

## 2005-03-29 ENCOUNTER — Ambulatory Visit (HOSPITAL_COMMUNITY): Admission: RE | Admit: 2005-03-29 | Discharge: 2005-03-29 | Payer: Self-pay | Admitting: Gastroenterology

## 2005-03-30 ENCOUNTER — Encounter: Admission: RE | Admit: 2005-03-30 | Discharge: 2005-03-30 | Payer: Self-pay | Admitting: Gastroenterology

## 2005-05-01 ENCOUNTER — Emergency Department (HOSPITAL_COMMUNITY): Admission: EM | Admit: 2005-05-01 | Discharge: 2005-05-01 | Payer: Self-pay | Admitting: Family Medicine

## 2005-06-10 IMAGING — CR DG KNEE COMPLETE 4+V*R*
4 series · 4 of 4 positions shown · non-contrast
Comparison: none

CLINICAL DATA: 62-year-old with pain in the knee since [REDACTED].  Knee surgery last [REDACTED].  No known injury.
 RIGHT KNEE FOUR VIEWS
 Comparison with study of the right hip on 12/07/03.  
 Four views are performed of the right knee, showing a portion of an intramedullary rod transfixing the right femur.  A lateral screw plate is also identified.  No evidence for loosening or fracture.  Lateral view shows no joint fluid.  Degenerative changes are noted involving the patellofemoral, medial and lateral compartments.
 IMPRESSION
 No adverse features of hardware.  No evidence for acute abnormality.  Degenerative changes.

[view not recorded (1 of 4)]
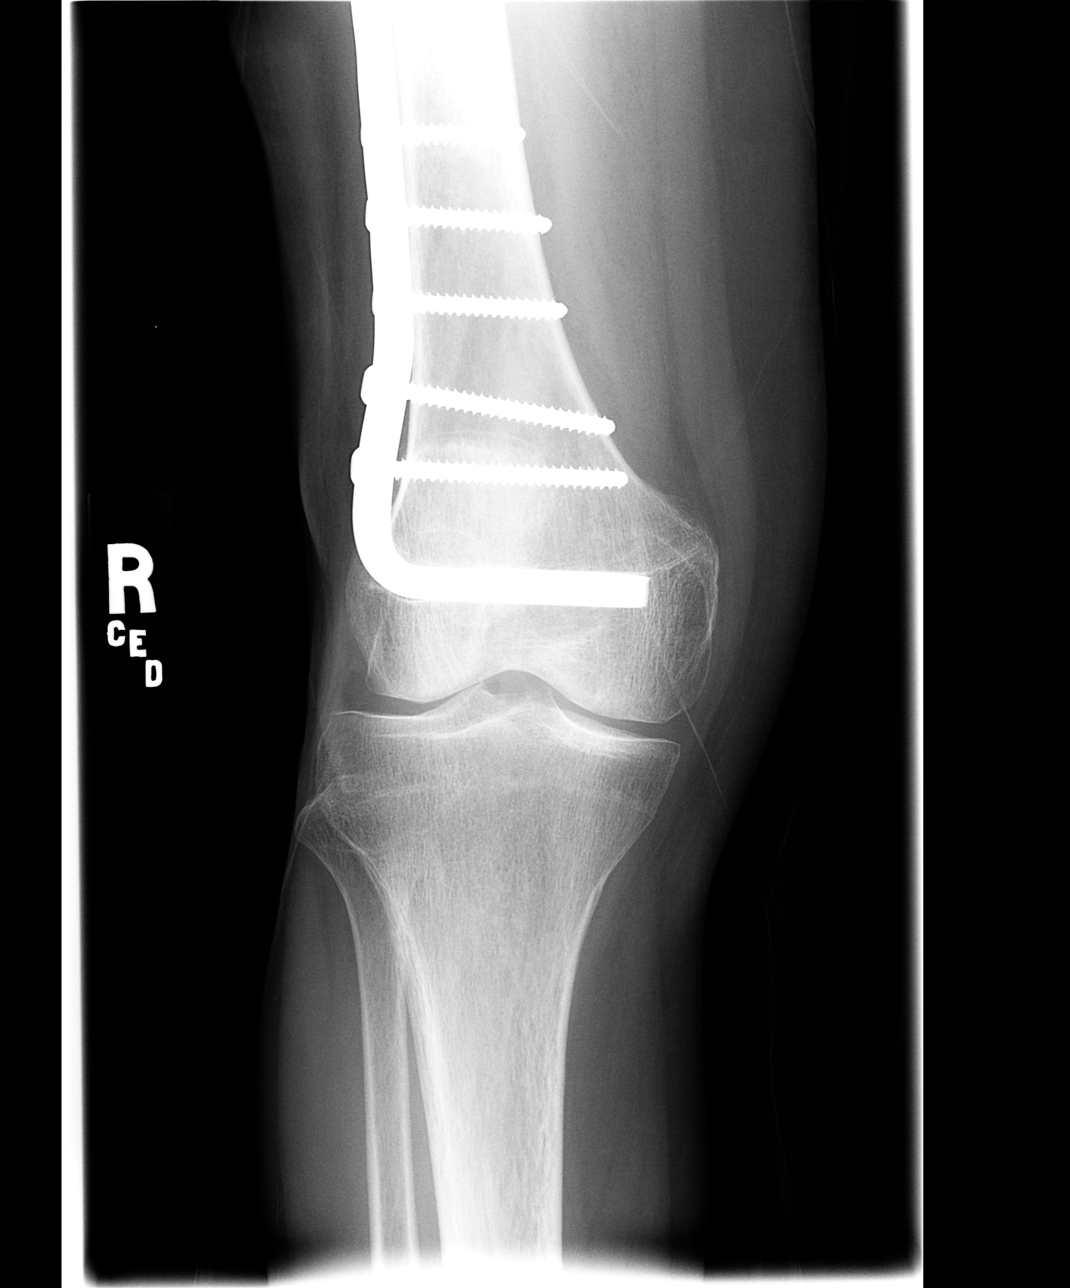

[view not recorded (2 of 4)]
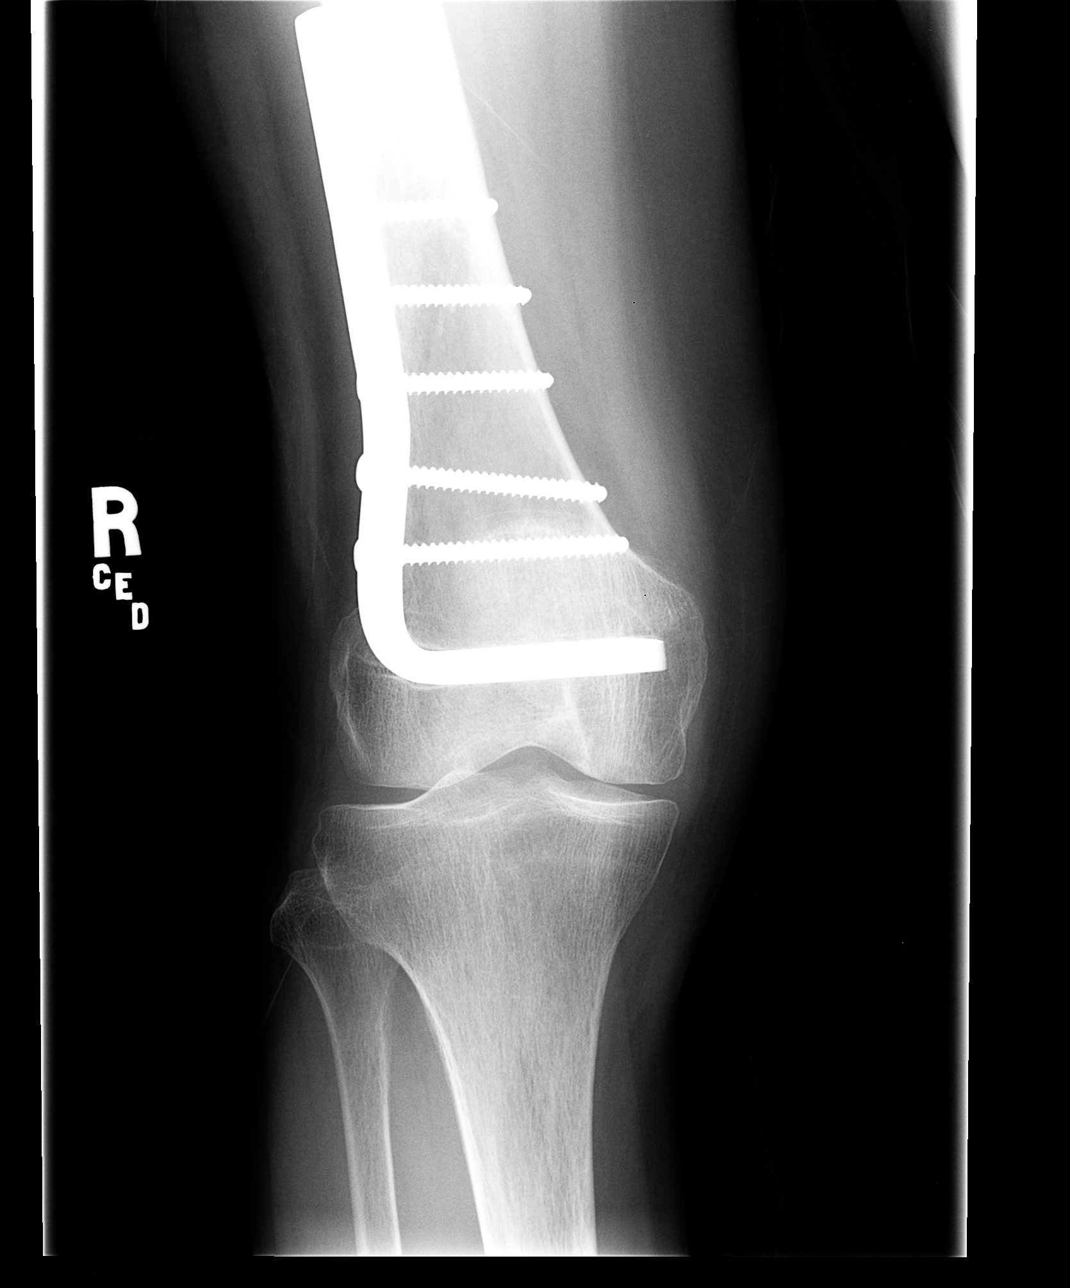

[view not recorded (3 of 4)]
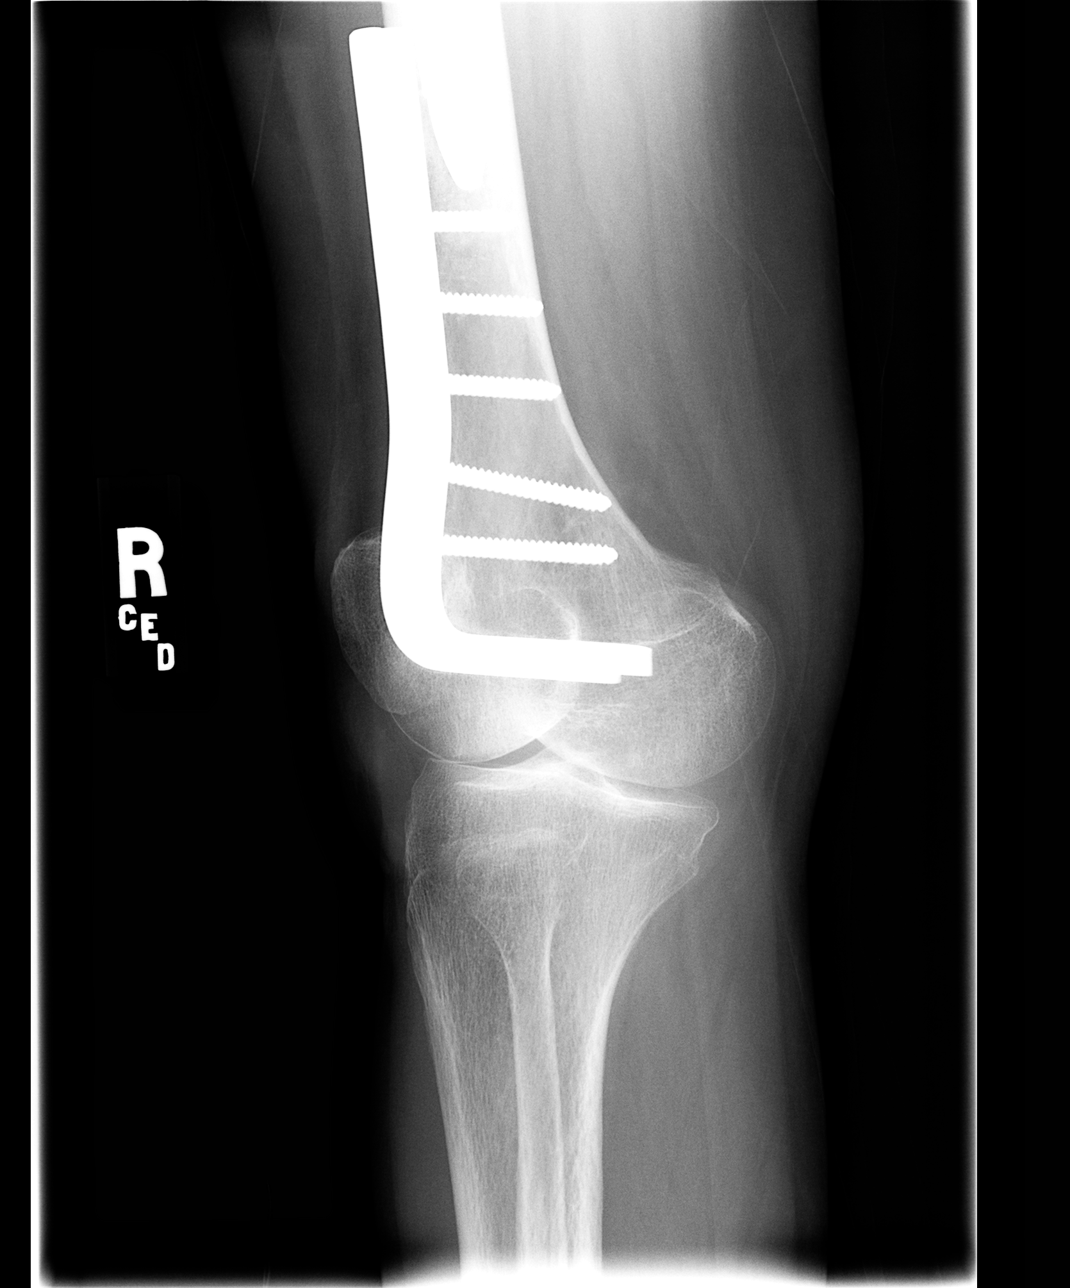

[view not recorded (4 of 4)]
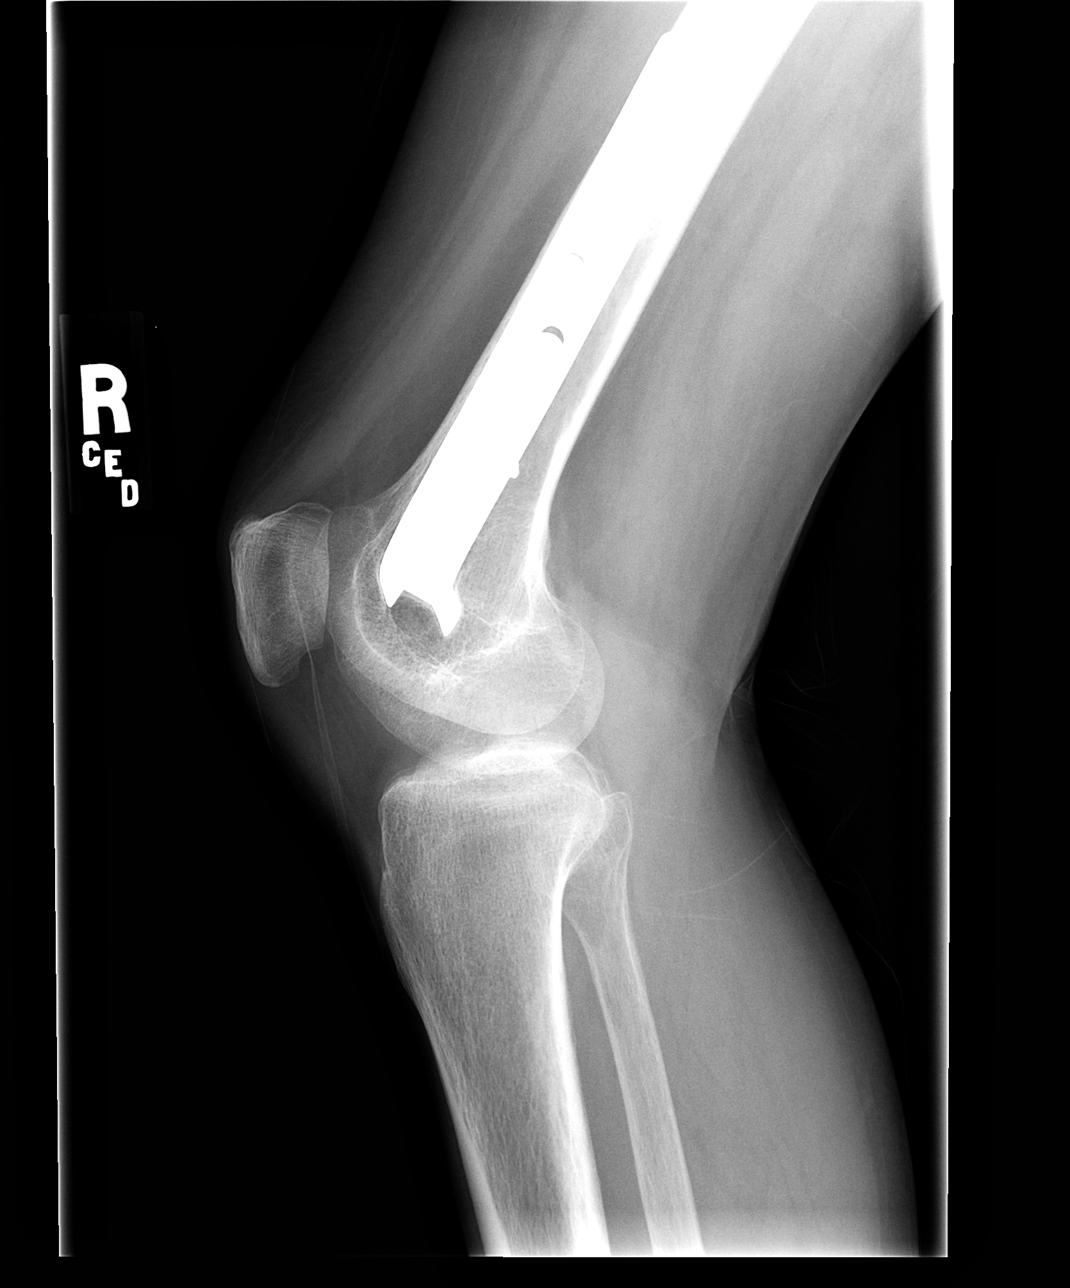

[4 of 4 positions shown; findings below may reference images not displayed]

## 2005-06-29 ENCOUNTER — Emergency Department (HOSPITAL_COMMUNITY): Admission: EM | Admit: 2005-06-29 | Discharge: 2005-06-29 | Payer: Self-pay | Admitting: Emergency Medicine

## 2005-09-02 ENCOUNTER — Ambulatory Visit (HOSPITAL_COMMUNITY): Admission: RE | Admit: 2005-09-02 | Discharge: 2005-09-02 | Payer: Self-pay | Admitting: Cardiology

## 2005-09-02 IMAGING — CR DG HIP COMPLETE 2+V*R*
4 series · 4 of 4 positions shown · non-contrast
Comparison: 05/02/2004
COMPARISON: 12/07/2003

CLINICAL DATA: Chronic right knee pain.

RIGHT KNEE - 4 VIEW

[view not recorded (1 of 4)]
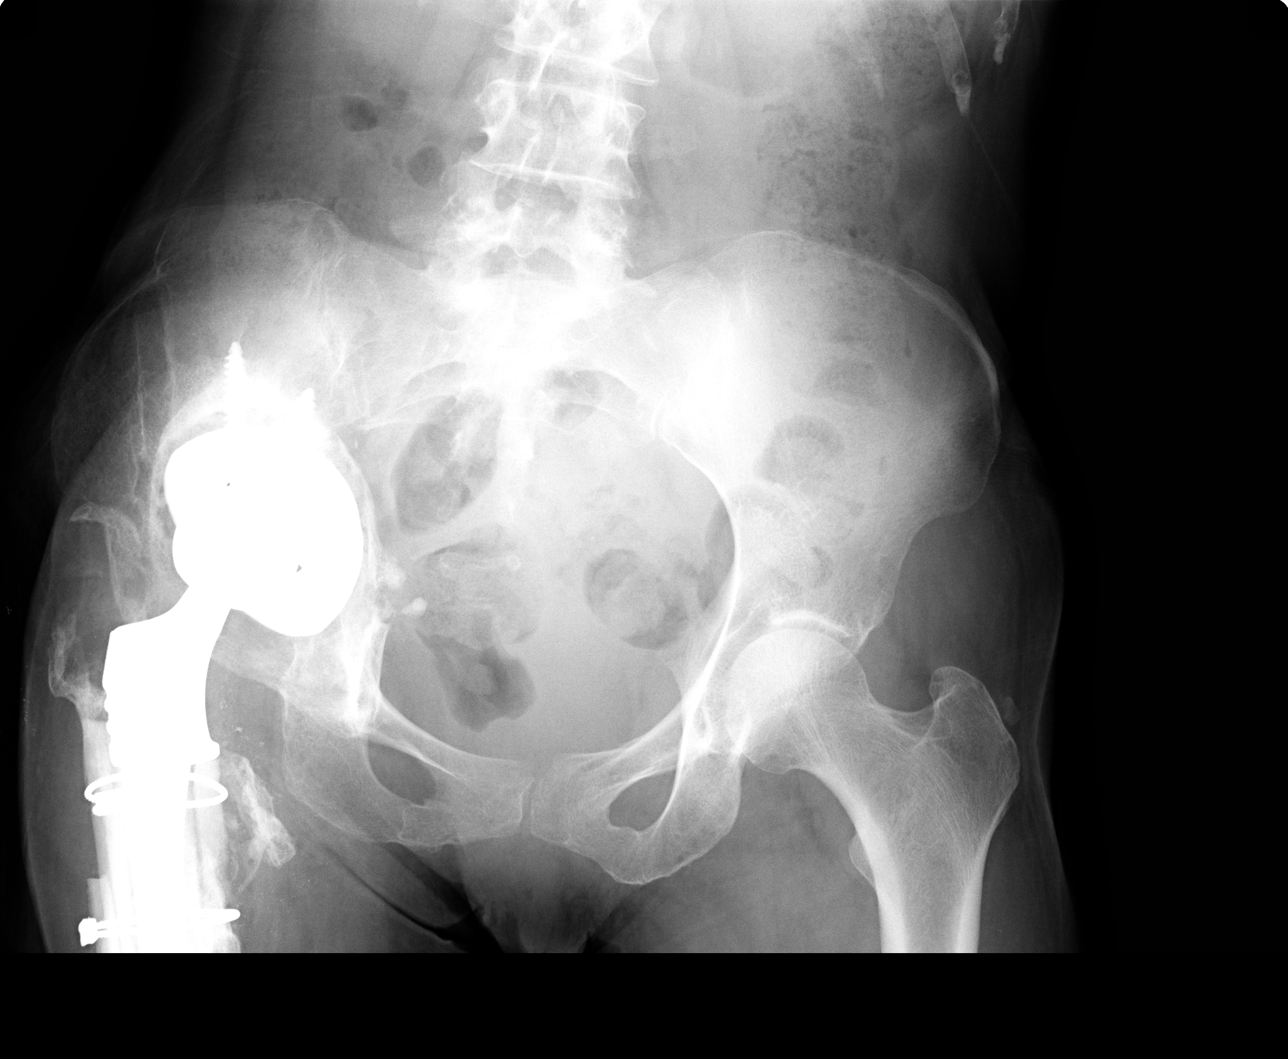

[view not recorded (2 of 4)]
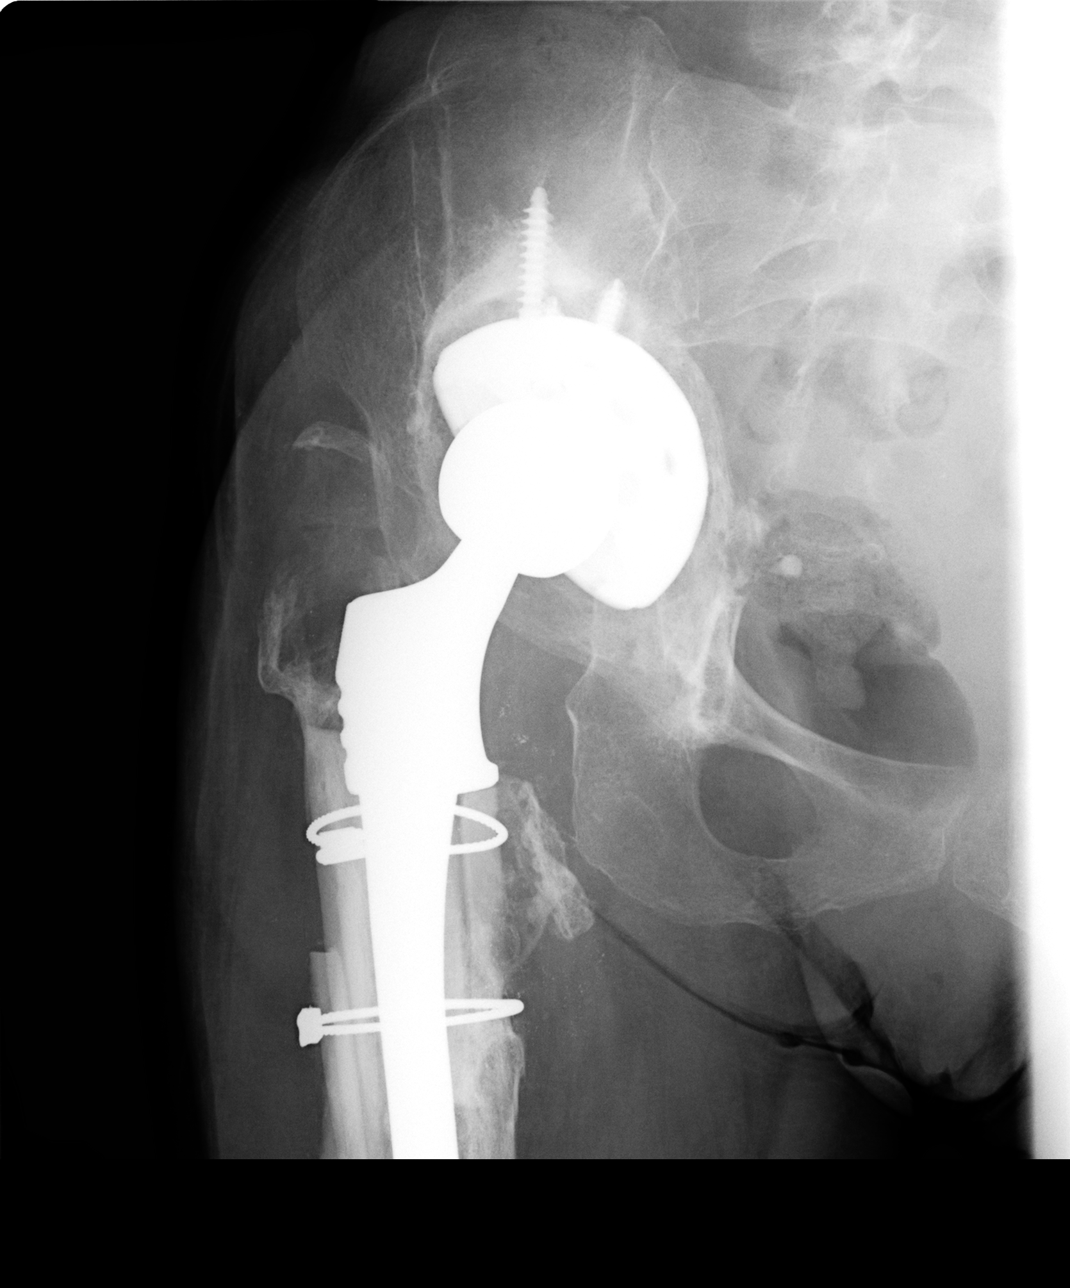

[view not recorded (3 of 4)]
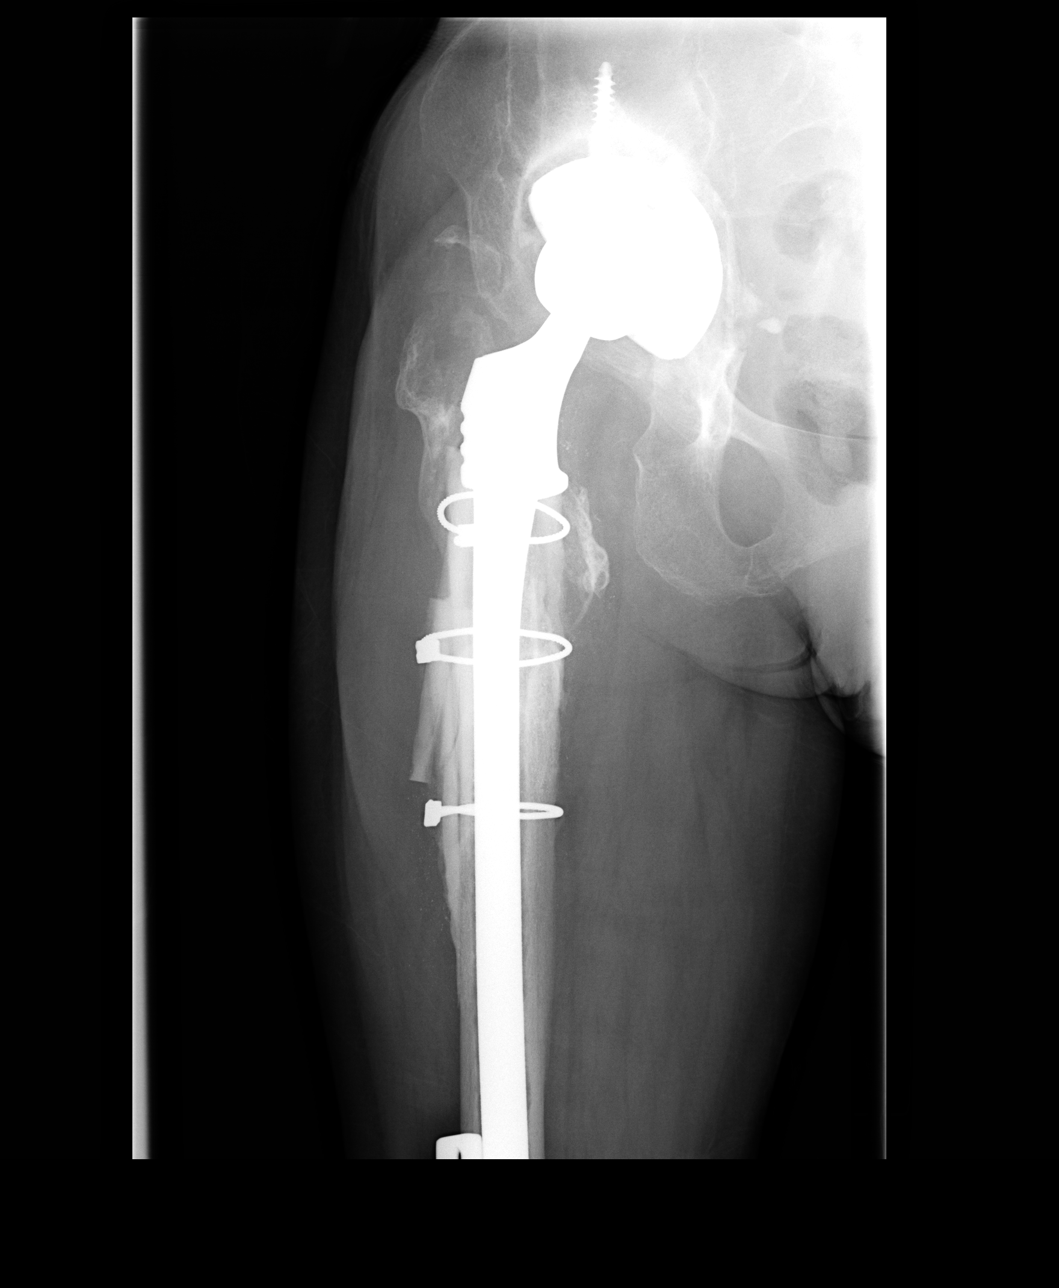

[view not recorded (4 of 4)]
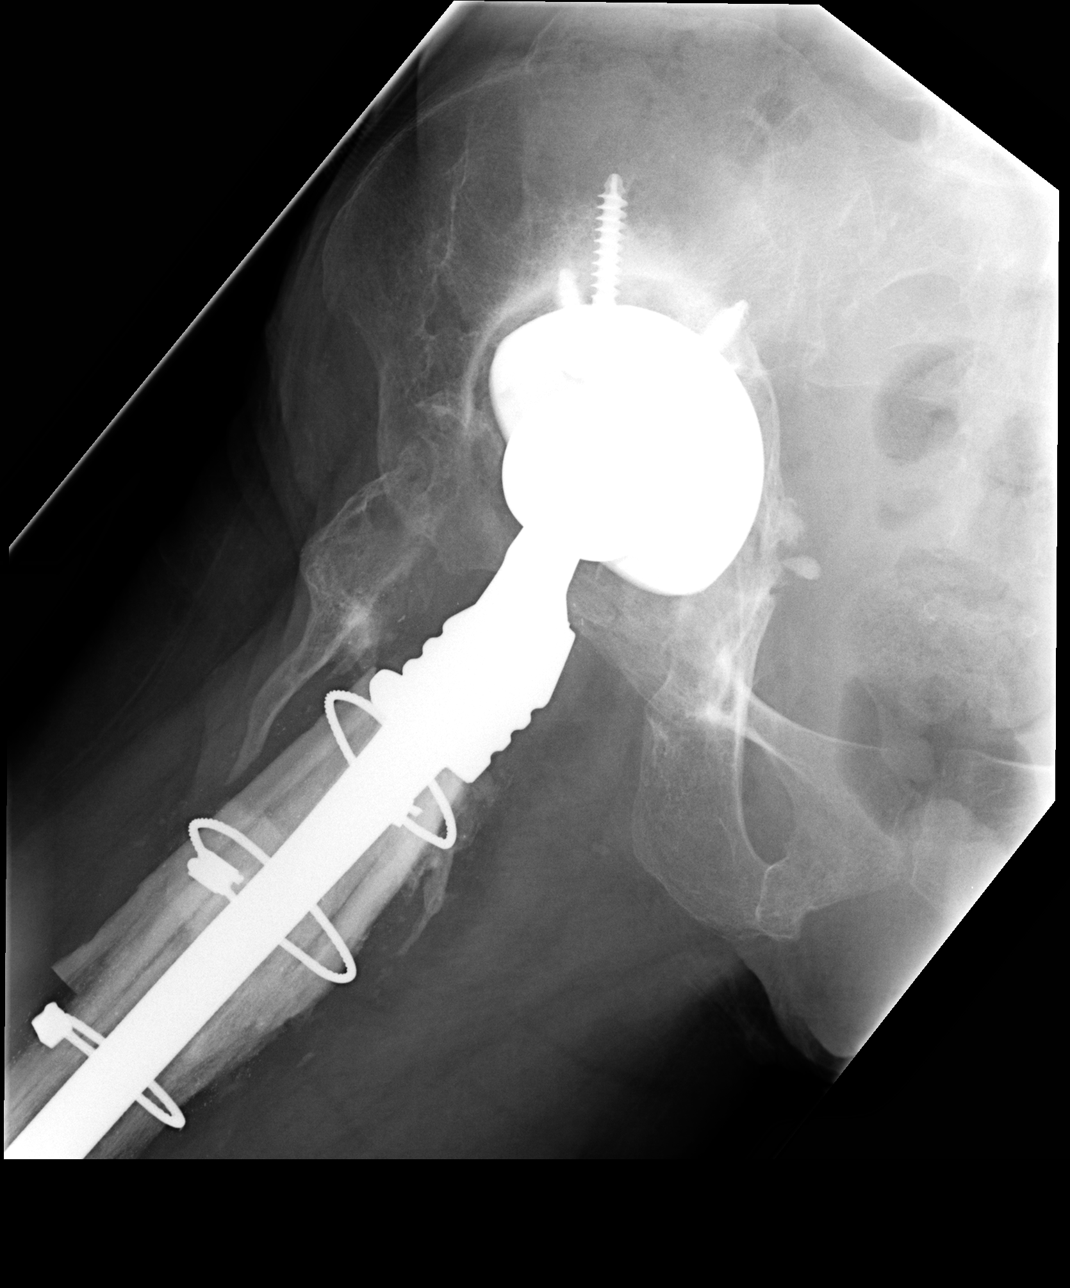

[4 of 4 positions shown; findings below may reference images not displayed]

FINDINGS: Stable hardware fixation of the distal right femur, diffuse osteopenia and mild
posterior patellar spur formation. No effusion or fracture.

IMPRESSION

No acute abnormality.

RIGHT HIP - 3  VIEW
FINDINGS: Stable right total hip prosthesis with protrusio acetabuli. Interval hardware fixation
of the previously suspected fracture distal to the femoral component of the prosthesis. No acute
fracture or dislocation. Diffuse osteopenia.

IMPRESSION

No acute abnormality.

## 2005-09-02 IMAGING — CR DG KNEE COMPLETE 4+V*R*
4 series · 4 of 4 positions shown · non-contrast
Comparison: 05/02/2004
COMPARISON: 12/07/2003

CLINICAL DATA: Chronic right knee pain.

RIGHT KNEE - 4 VIEW

[view not recorded (1 of 4)]
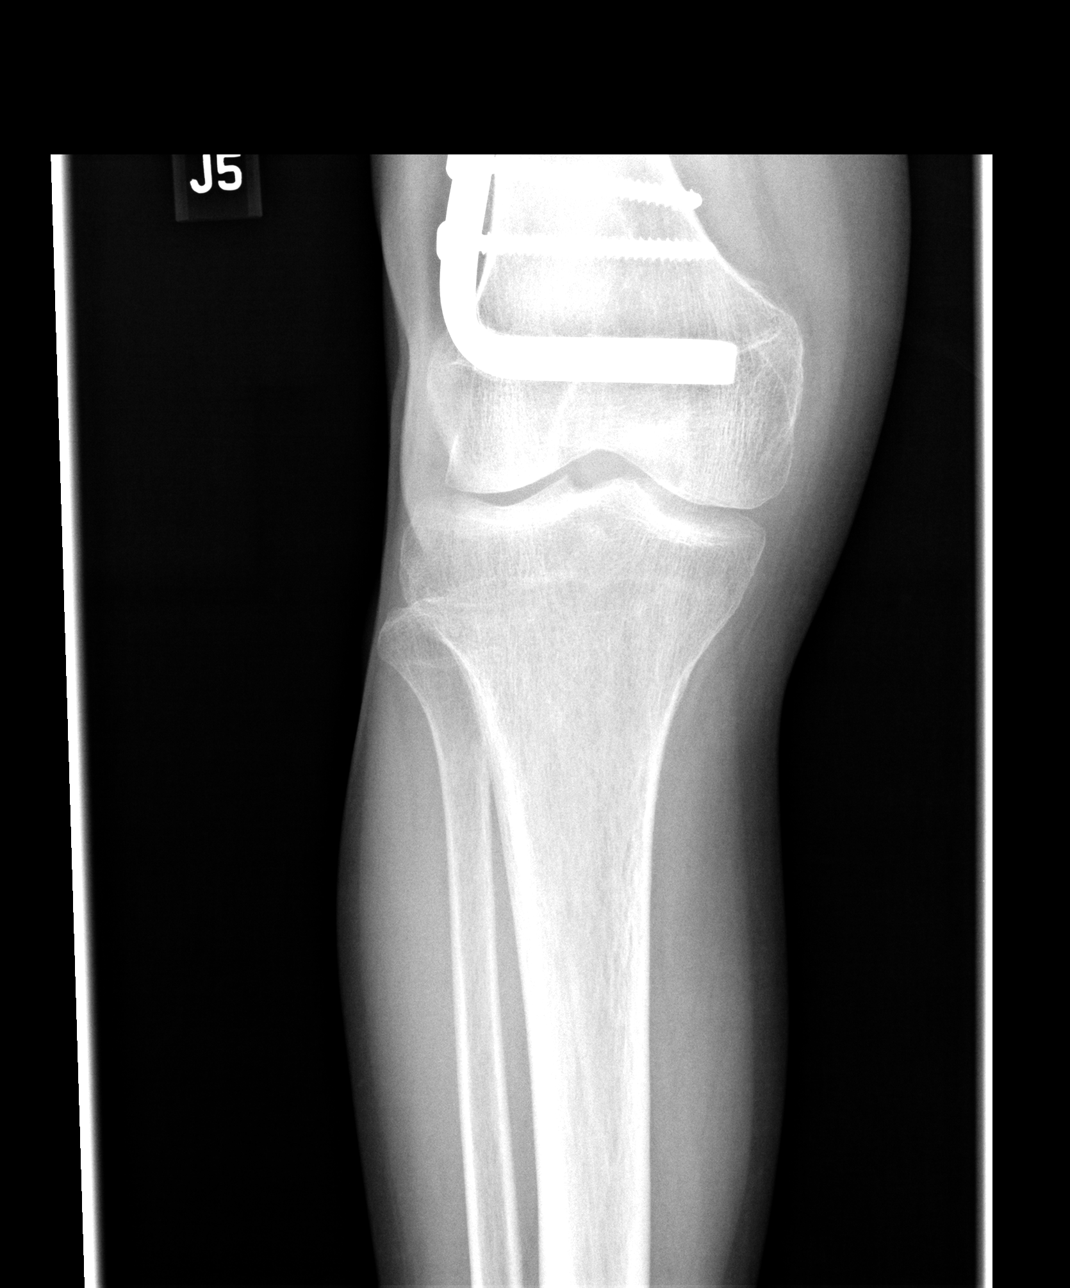

[view not recorded (2 of 4)]
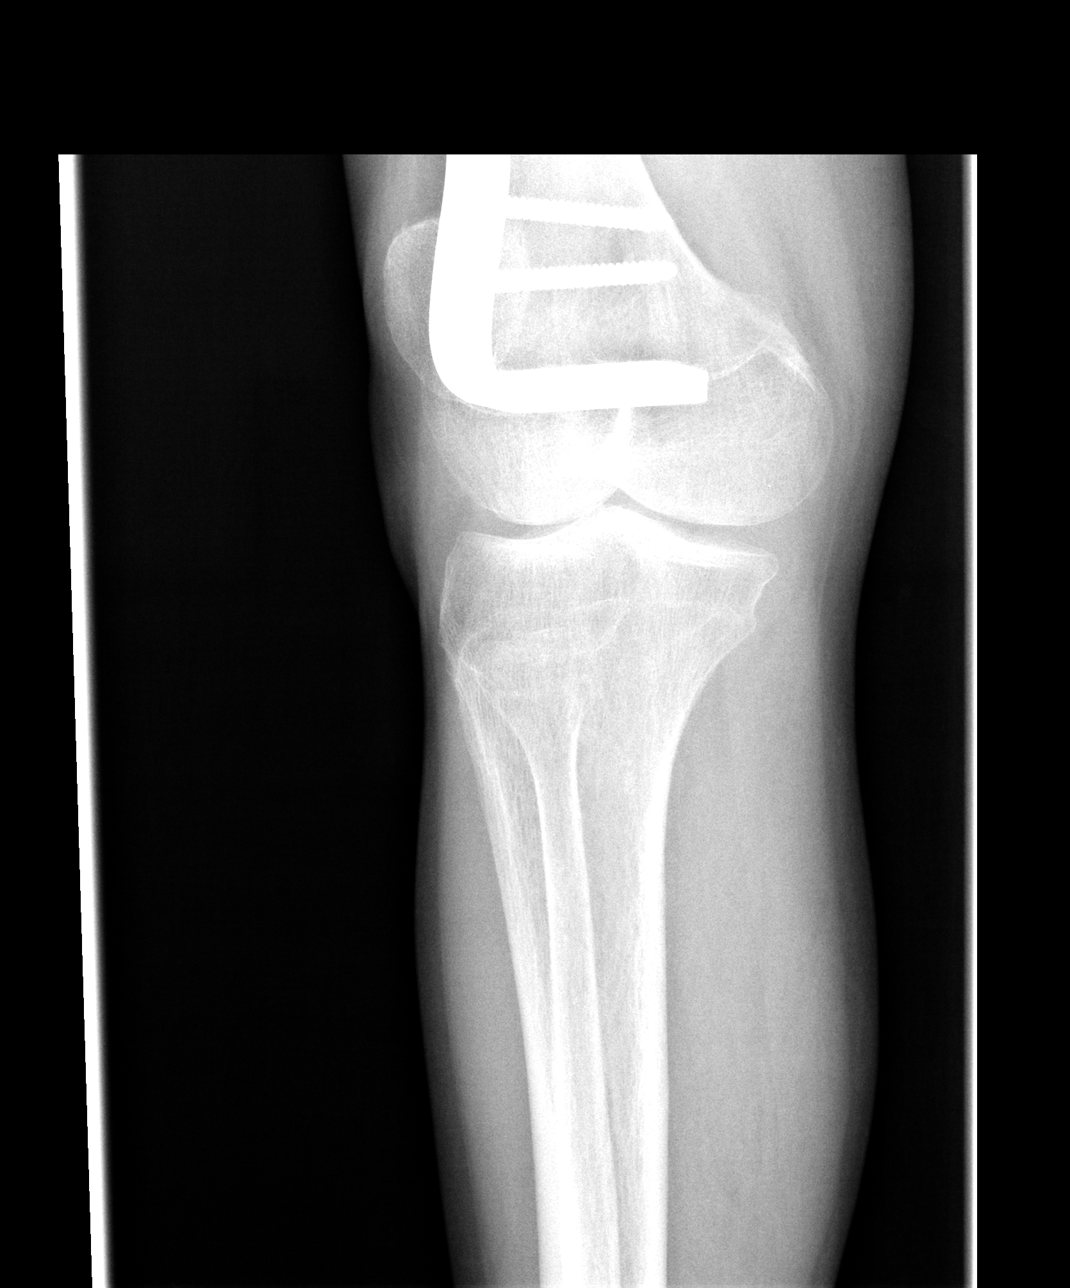

[view not recorded (3 of 4)]
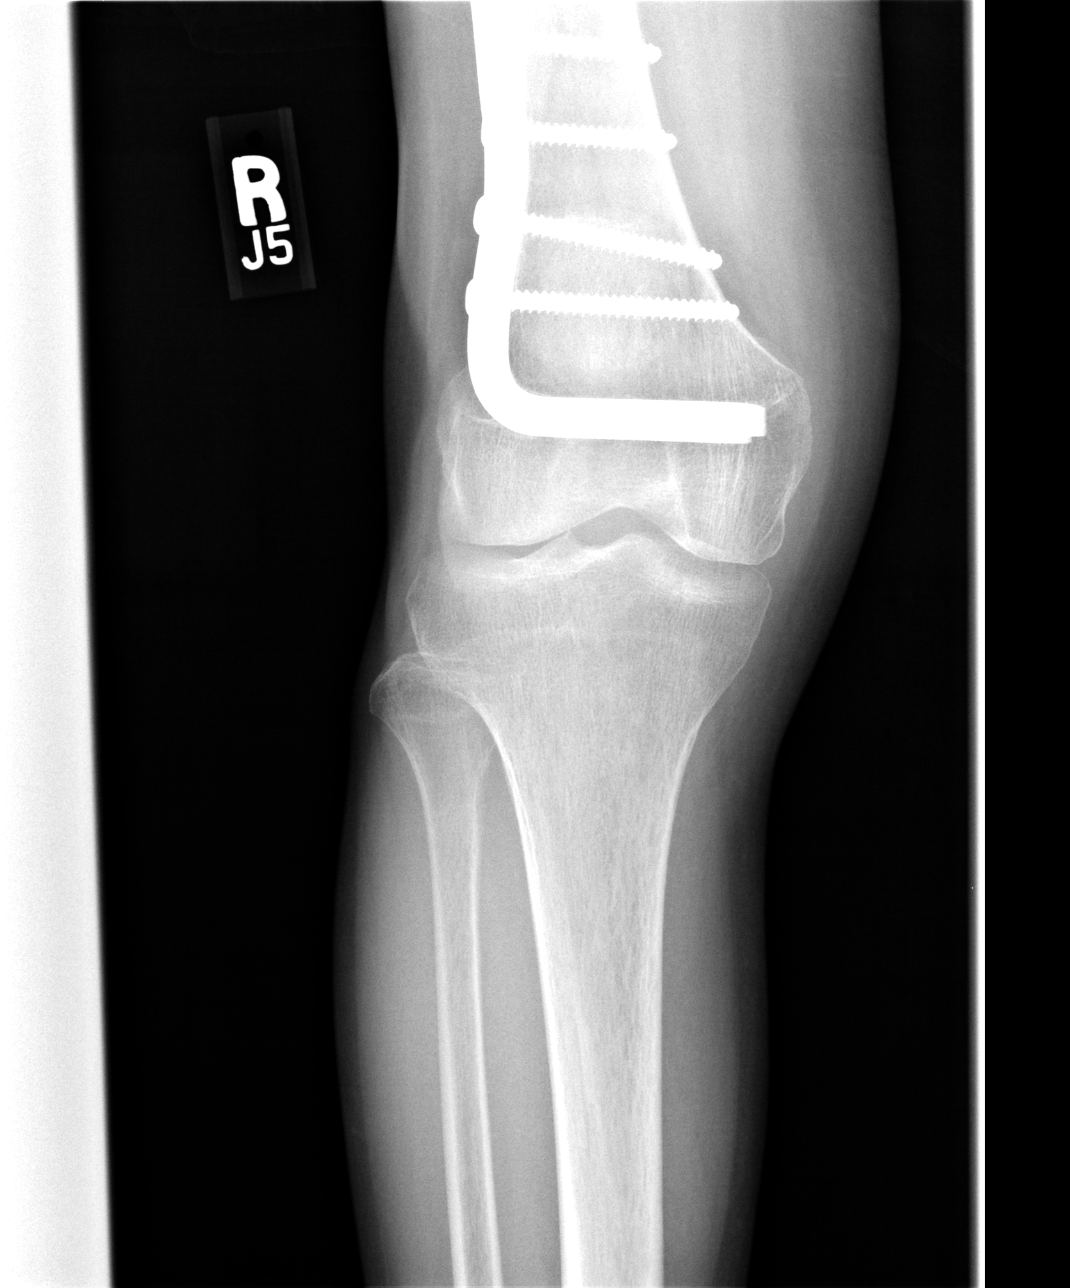

[view not recorded (4 of 4)]
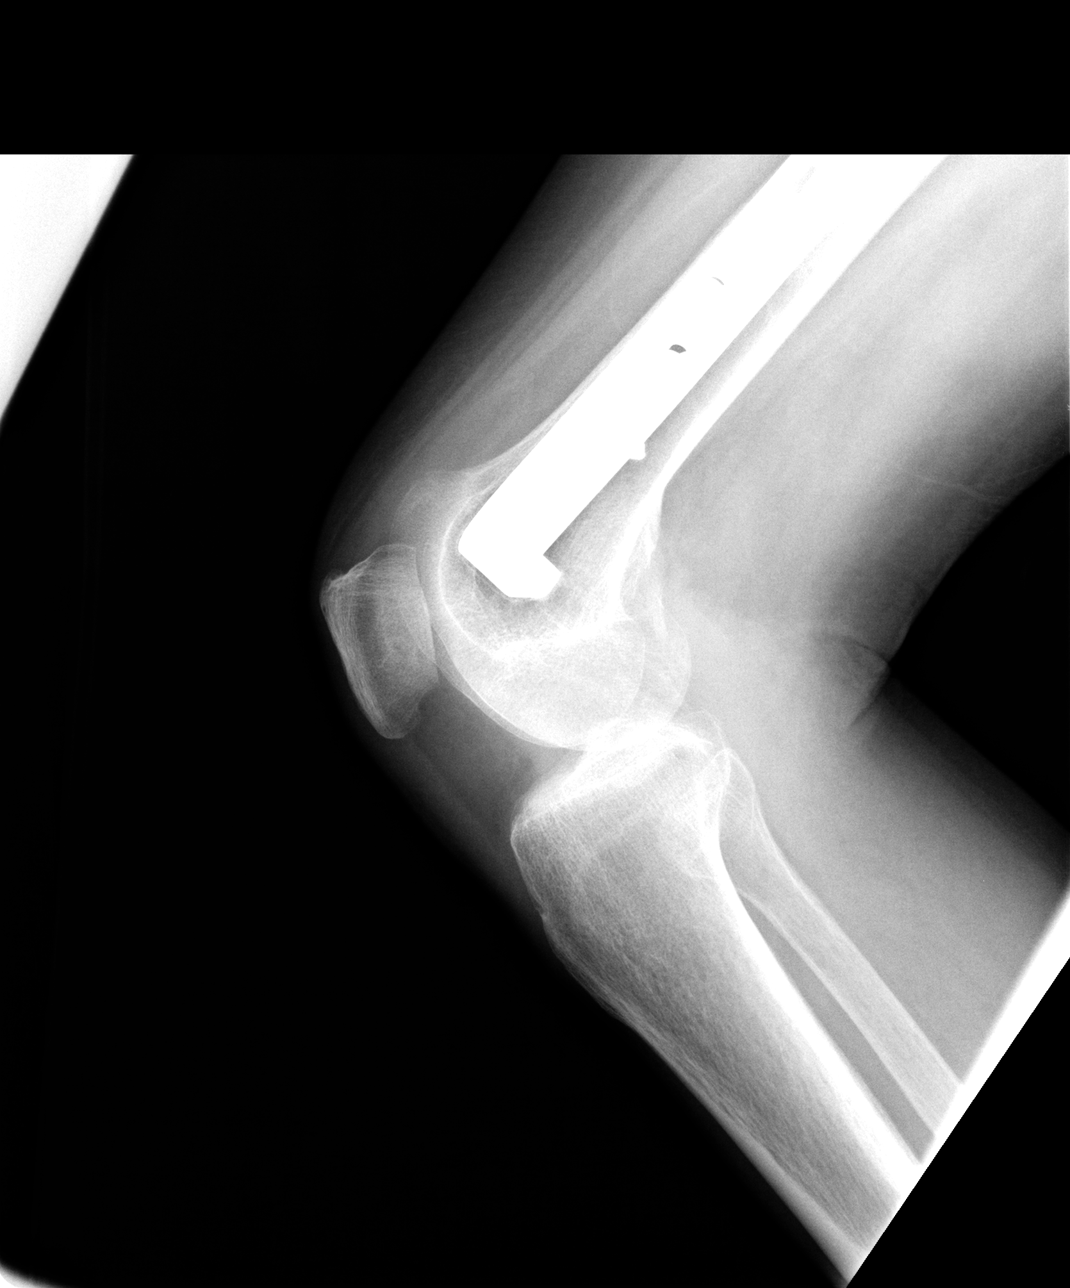

[4 of 4 positions shown; findings below may reference images not displayed]

FINDINGS: Stable hardware fixation of the distal right femur, diffuse osteopenia and mild
posterior patellar spur formation. No effusion or fracture.

IMPRESSION

No acute abnormality.

RIGHT HIP - 3  VIEW
FINDINGS: Stable right total hip prosthesis with protrusio acetabuli. Interval hardware fixation
of the previously suspected fracture distal to the femoral component of the prosthesis. No acute
fracture or dislocation. Diffuse osteopenia.

IMPRESSION

No acute abnormality.

## 2005-11-27 IMAGING — CR DG HAND COMPLETE 3+V*L*
2 series · 2 of 2 positions shown · non-contrast
Comparison: none

CLINICAL DATA: Pain and swelling in the left hand, status-post fall four days ago.  
LEFT HAND THREE VIEWS:
There are oblique extra-articular diaphyseal fractures of the third and fourth metacarpals.   On the lateral view, one of these fractures demonstrates significant dorsal displacement.   This is likely the third metacarpal. There is associated dorsal soft tissue swelling.  No dislocation is evident. There are degenerative changes at the base of the first metacarpal.

[view not recorded (1 of 2)]
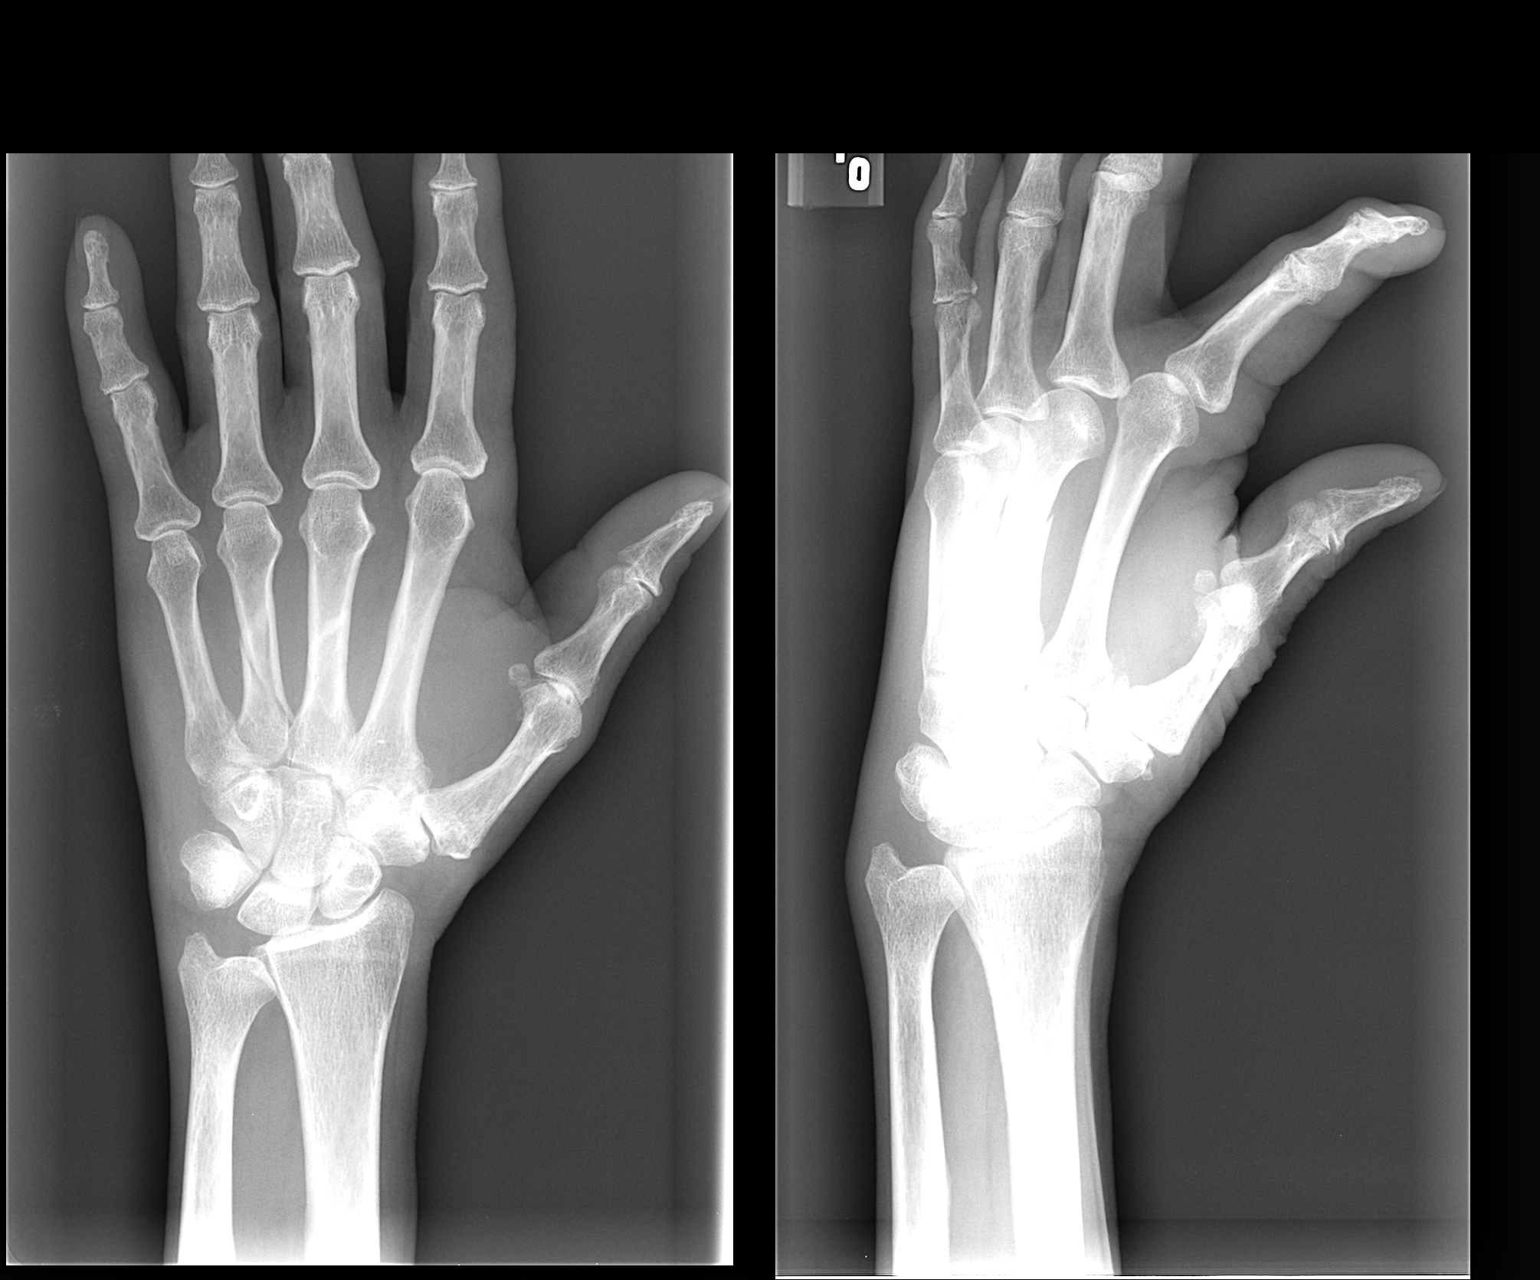

[view not recorded (2 of 2)]
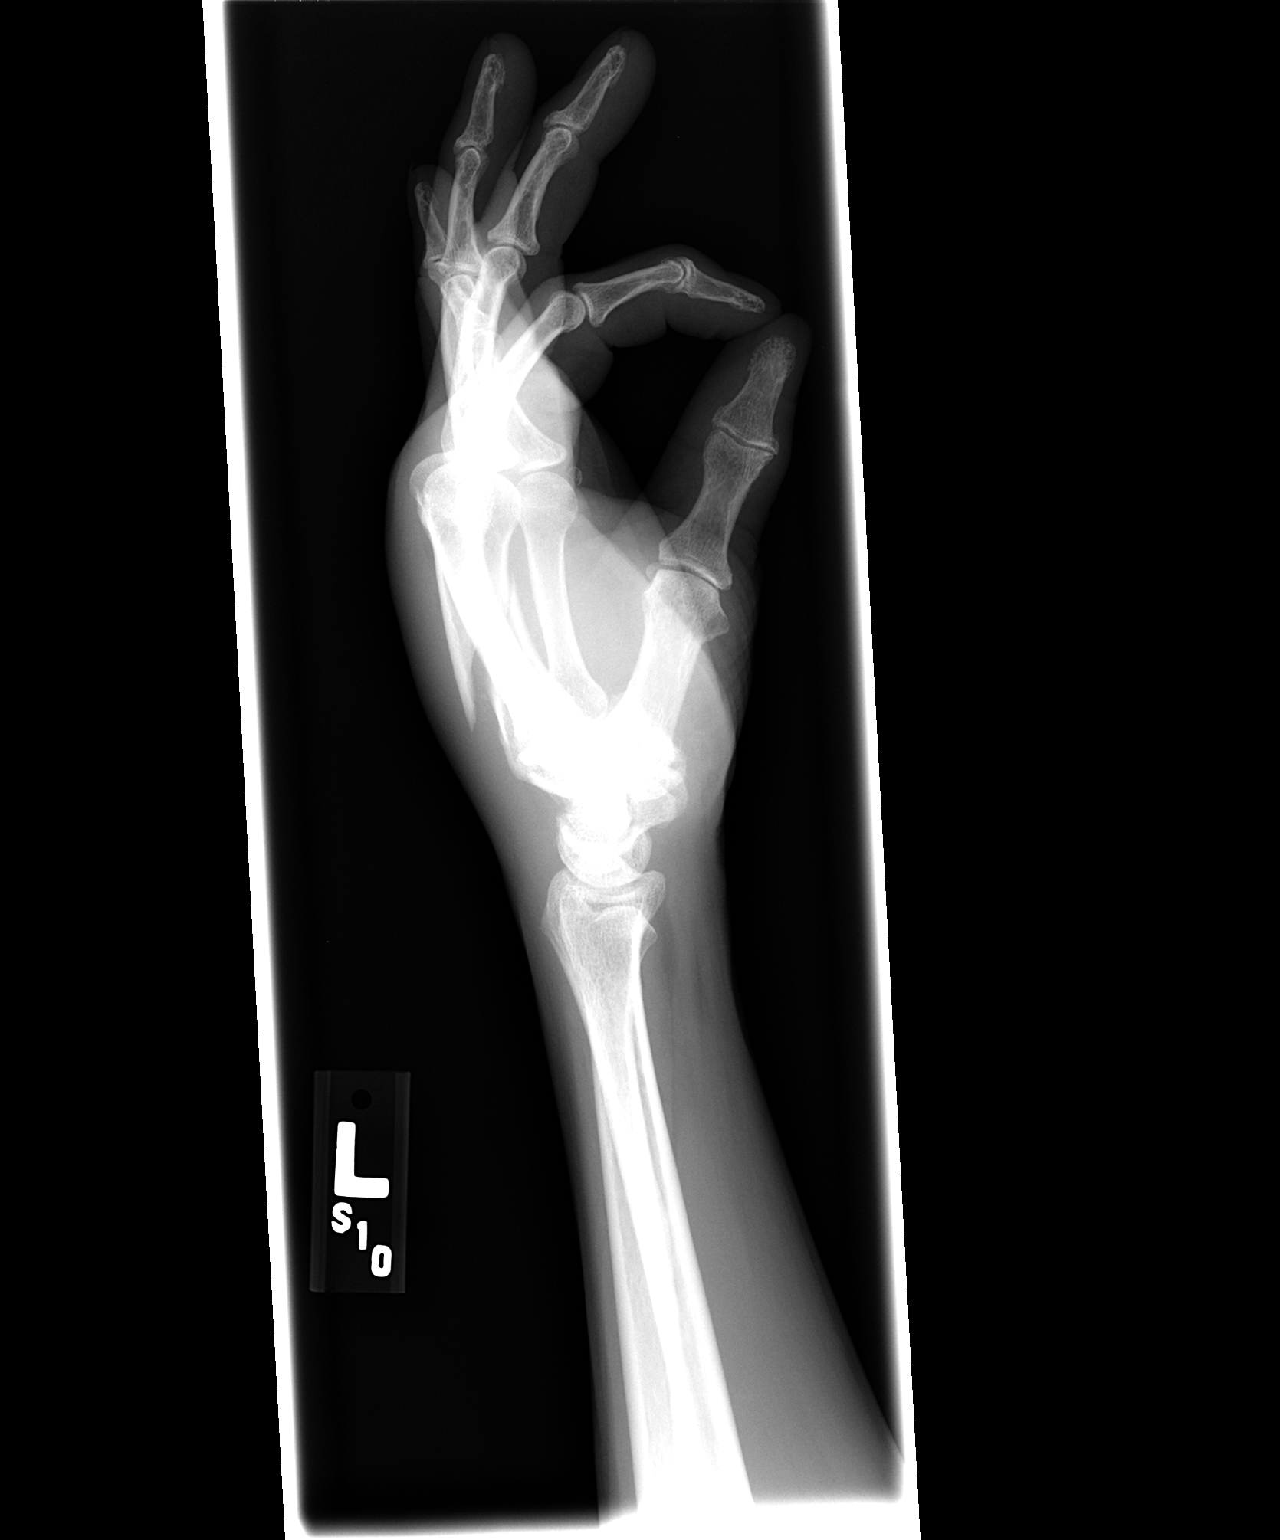

[2 of 2 positions shown; findings below may reference images not displayed]

IMPRESSION: Oblique fractures of the third and fourth metacarpals as described. The third metacarpal fracture demonstrates 4 mm of posterior displacement.

## 2006-01-11 ENCOUNTER — Emergency Department (HOSPITAL_COMMUNITY): Admission: EM | Admit: 2006-01-11 | Discharge: 2006-01-11 | Payer: Self-pay | Admitting: Emergency Medicine

## 2006-02-06 IMAGING — CR DG ABDOMEN 2V
2 series · 2 of 2 positions shown · non-contrast
Comparison: None.

CLINICAL DATA: Patient is weak and dizzy.  Abdominal pain.
 TWO VIEWS OF THE ABDOMEN, SUPINE:

[w abdomen upright *]
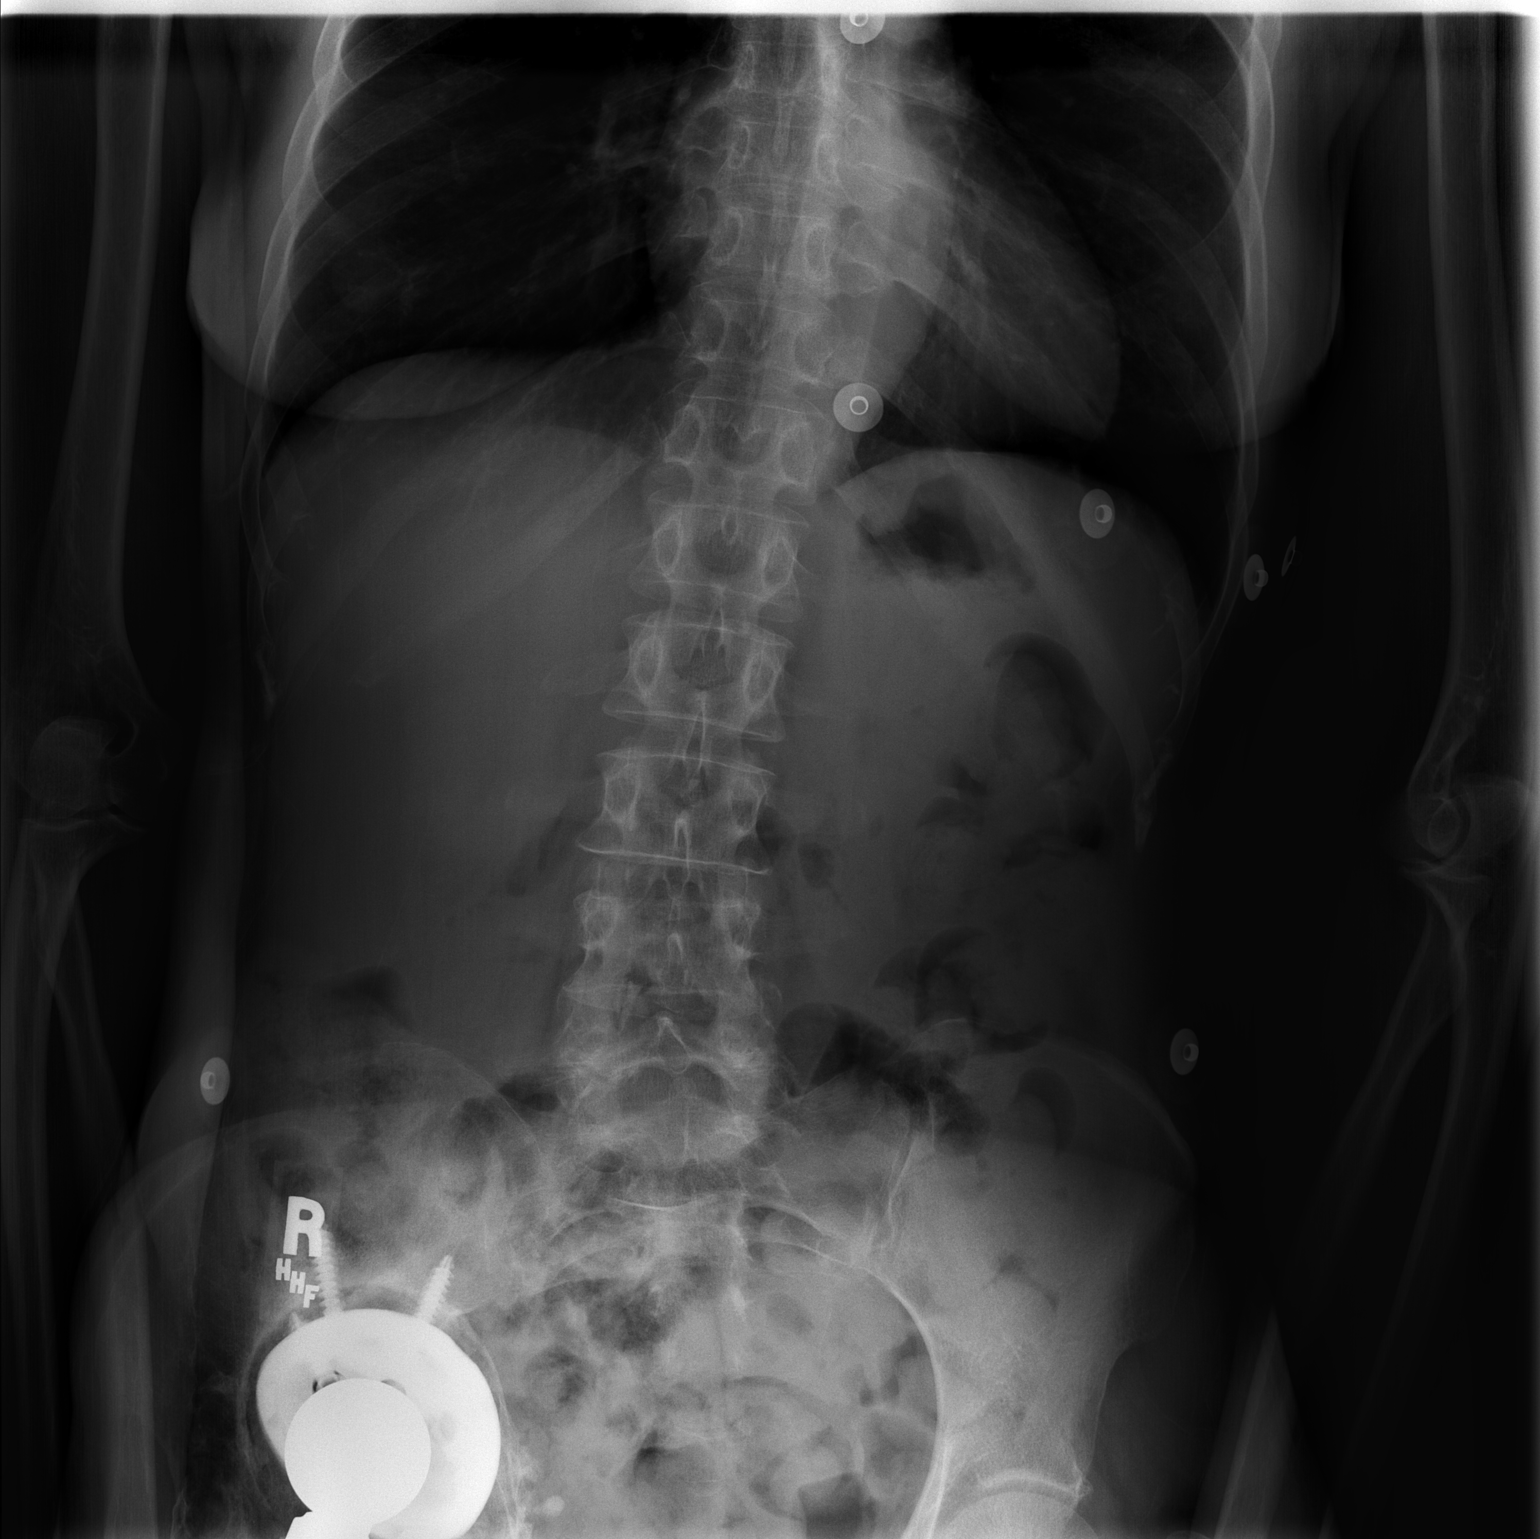

[t abdomen supine]
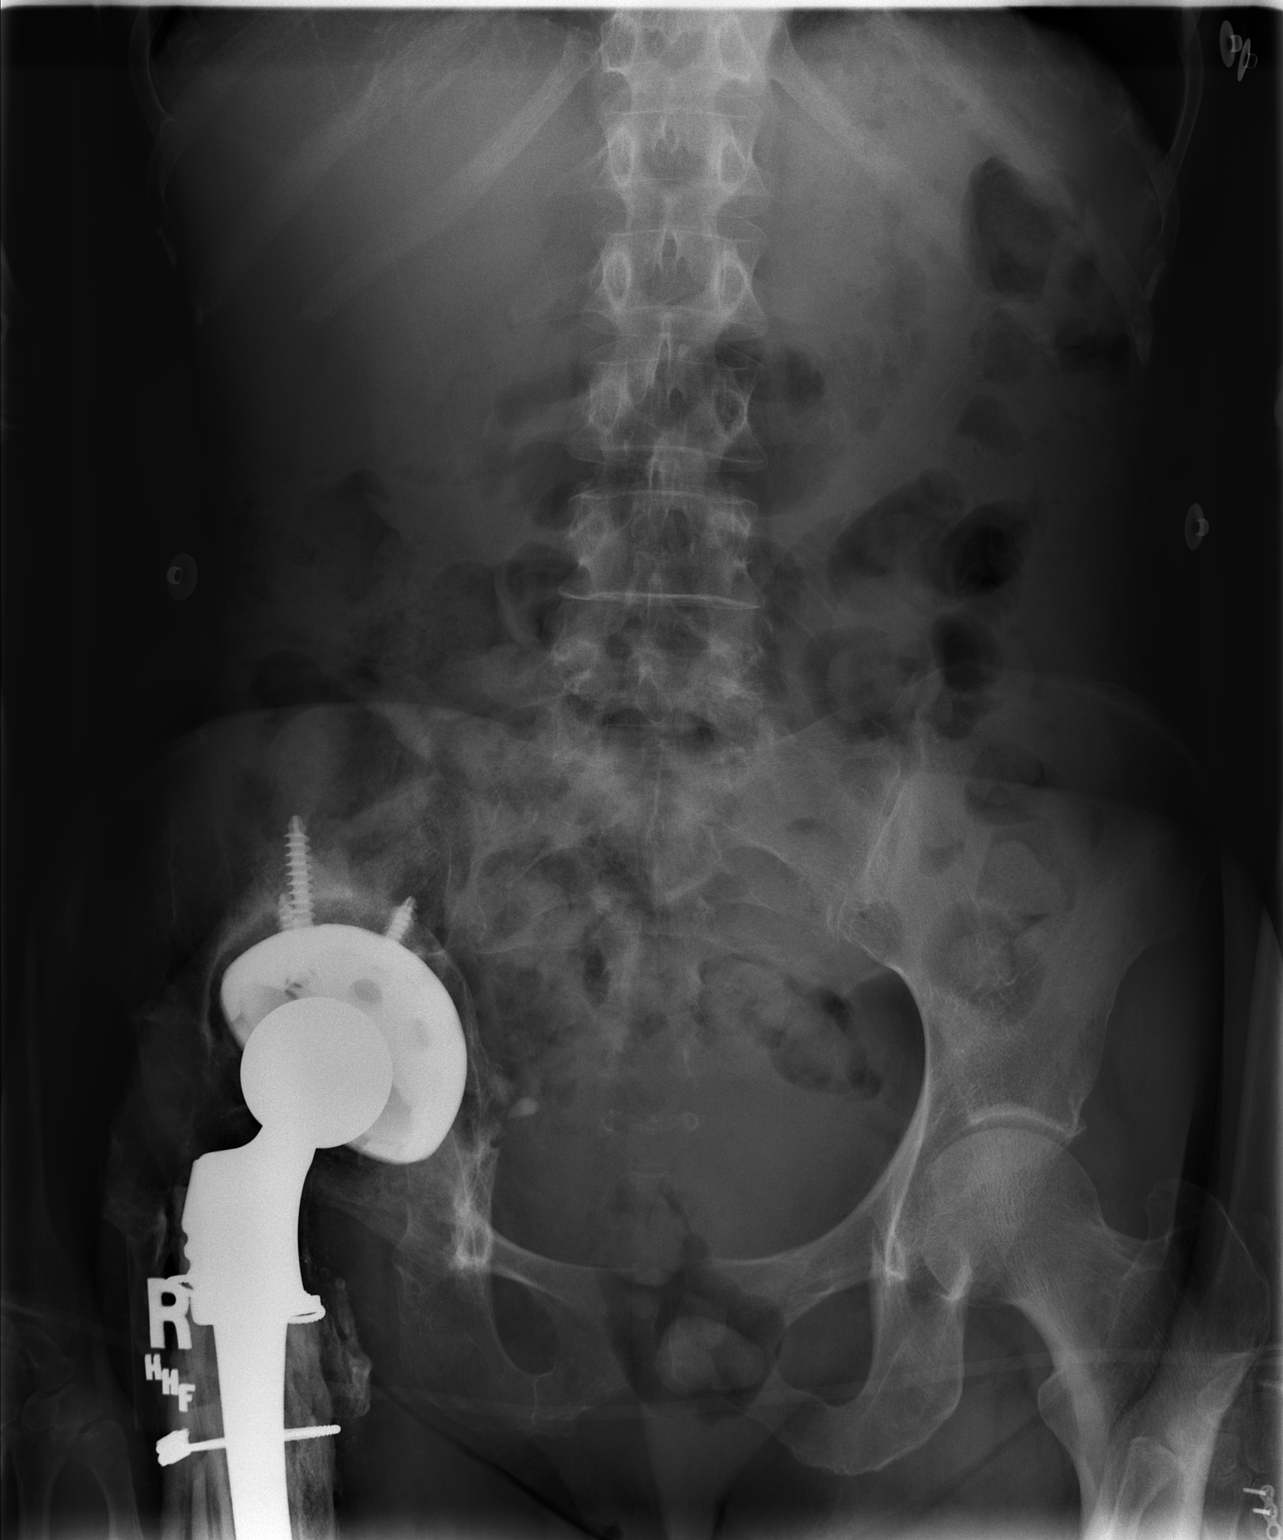

[2 of 2 positions shown; findings below may reference images not displayed]

FINDINGS: Supine and erect views reveal retained feces throughout the colon with possible impaction in the rectosigmoid area.  There are a few loops of small bowel gas.  Right total hip replacement with mark protrusio acetabulare.
IMPRESSION: Retained feces with probable impaction in the rectum.

## 2006-05-06 ENCOUNTER — Emergency Department (HOSPITAL_COMMUNITY): Admission: EM | Admit: 2006-05-06 | Discharge: 2006-05-06 | Payer: Self-pay | Admitting: Emergency Medicine

## 2006-05-18 ENCOUNTER — Emergency Department (HOSPITAL_COMMUNITY): Admission: EM | Admit: 2006-05-18 | Discharge: 2006-05-18 | Payer: Self-pay | Admitting: Family Medicine

## 2006-06-06 ENCOUNTER — Emergency Department (HOSPITAL_COMMUNITY): Admission: EM | Admit: 2006-06-06 | Discharge: 2006-06-06 | Payer: Self-pay | Admitting: Emergency Medicine

## 2006-06-29 ENCOUNTER — Emergency Department (HOSPITAL_COMMUNITY): Admission: EM | Admit: 2006-06-29 | Discharge: 2006-06-29 | Payer: Self-pay | Admitting: Emergency Medicine

## 2006-07-12 ENCOUNTER — Ambulatory Visit (HOSPITAL_COMMUNITY): Admission: RE | Admit: 2006-07-12 | Discharge: 2006-07-12 | Payer: Self-pay | Admitting: Cardiology

## 2006-10-11 IMAGING — CR DG CHEST 2V
2 series · 2 of 2 positions shown · non-contrast
Comparison: none

CLINICAL DATA: Hypertension.  Smoker ? 1 pack per day for 20 years.  
 CHEST - 2 VIEWS:

[view not recorded (1 of 2)]
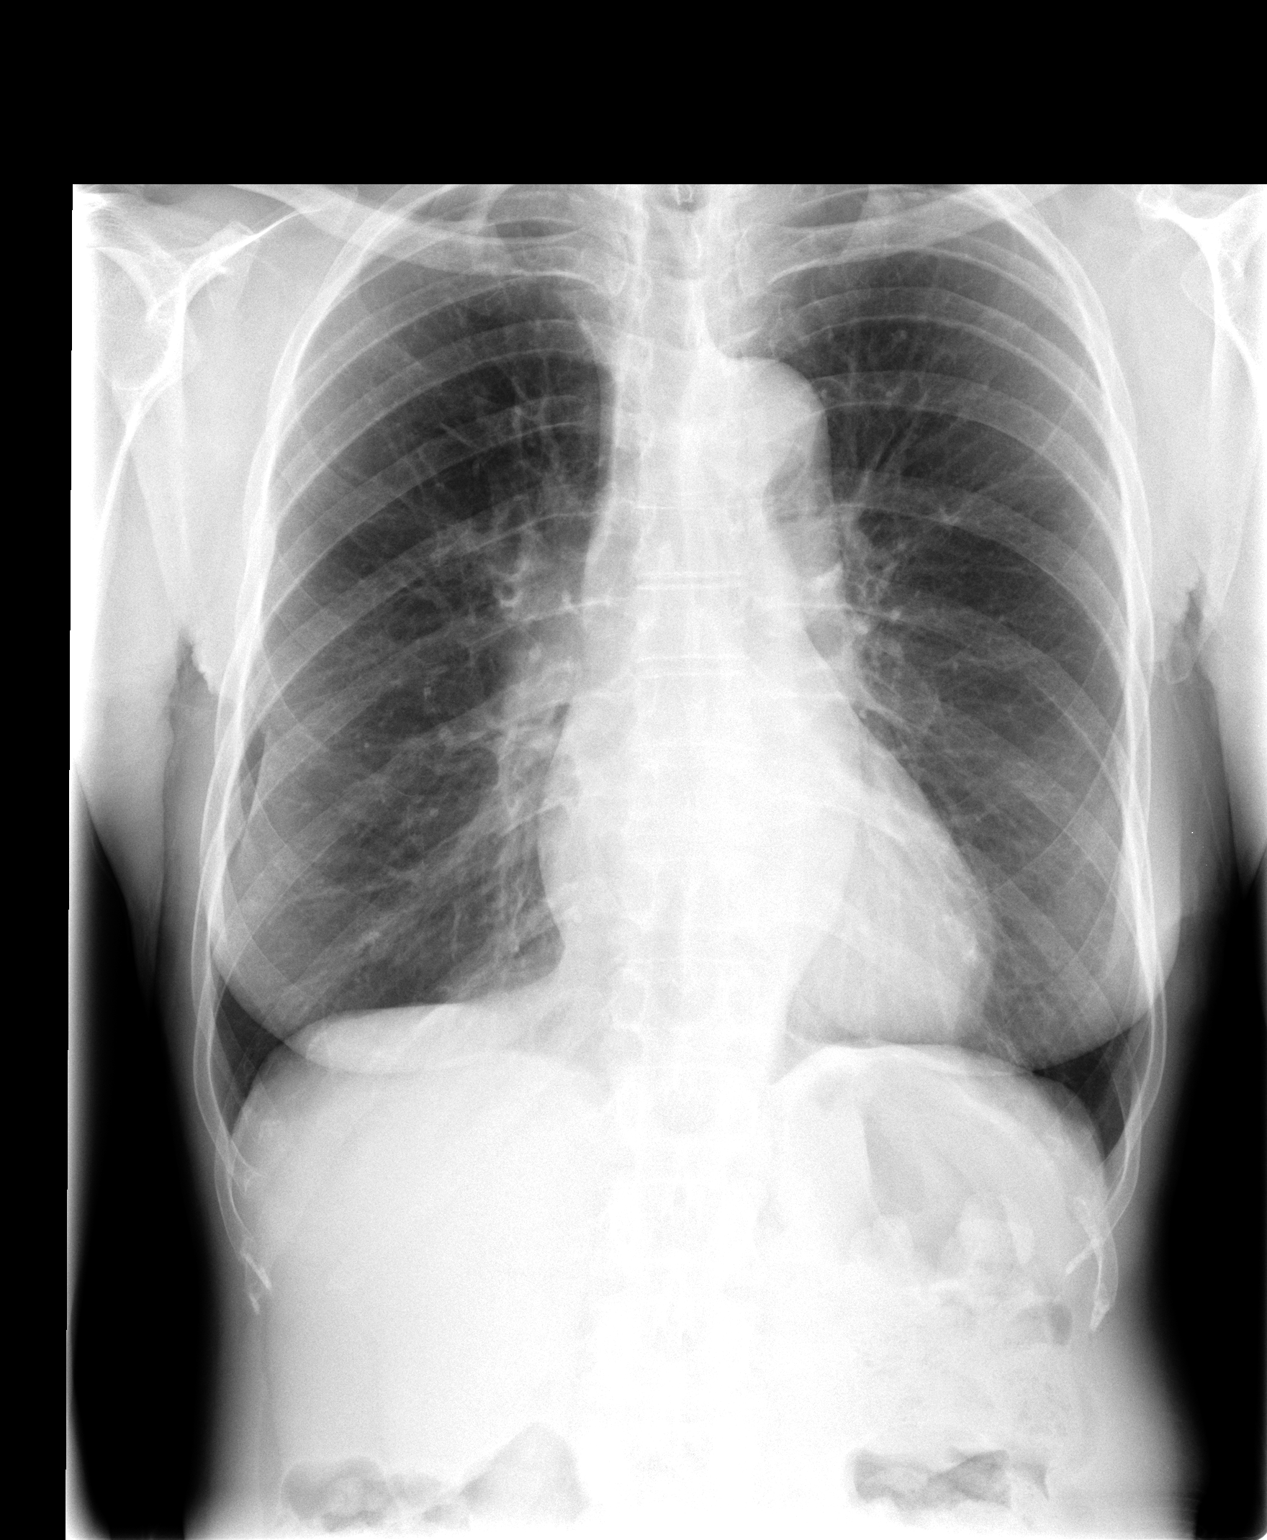

[view not recorded (2 of 2)]
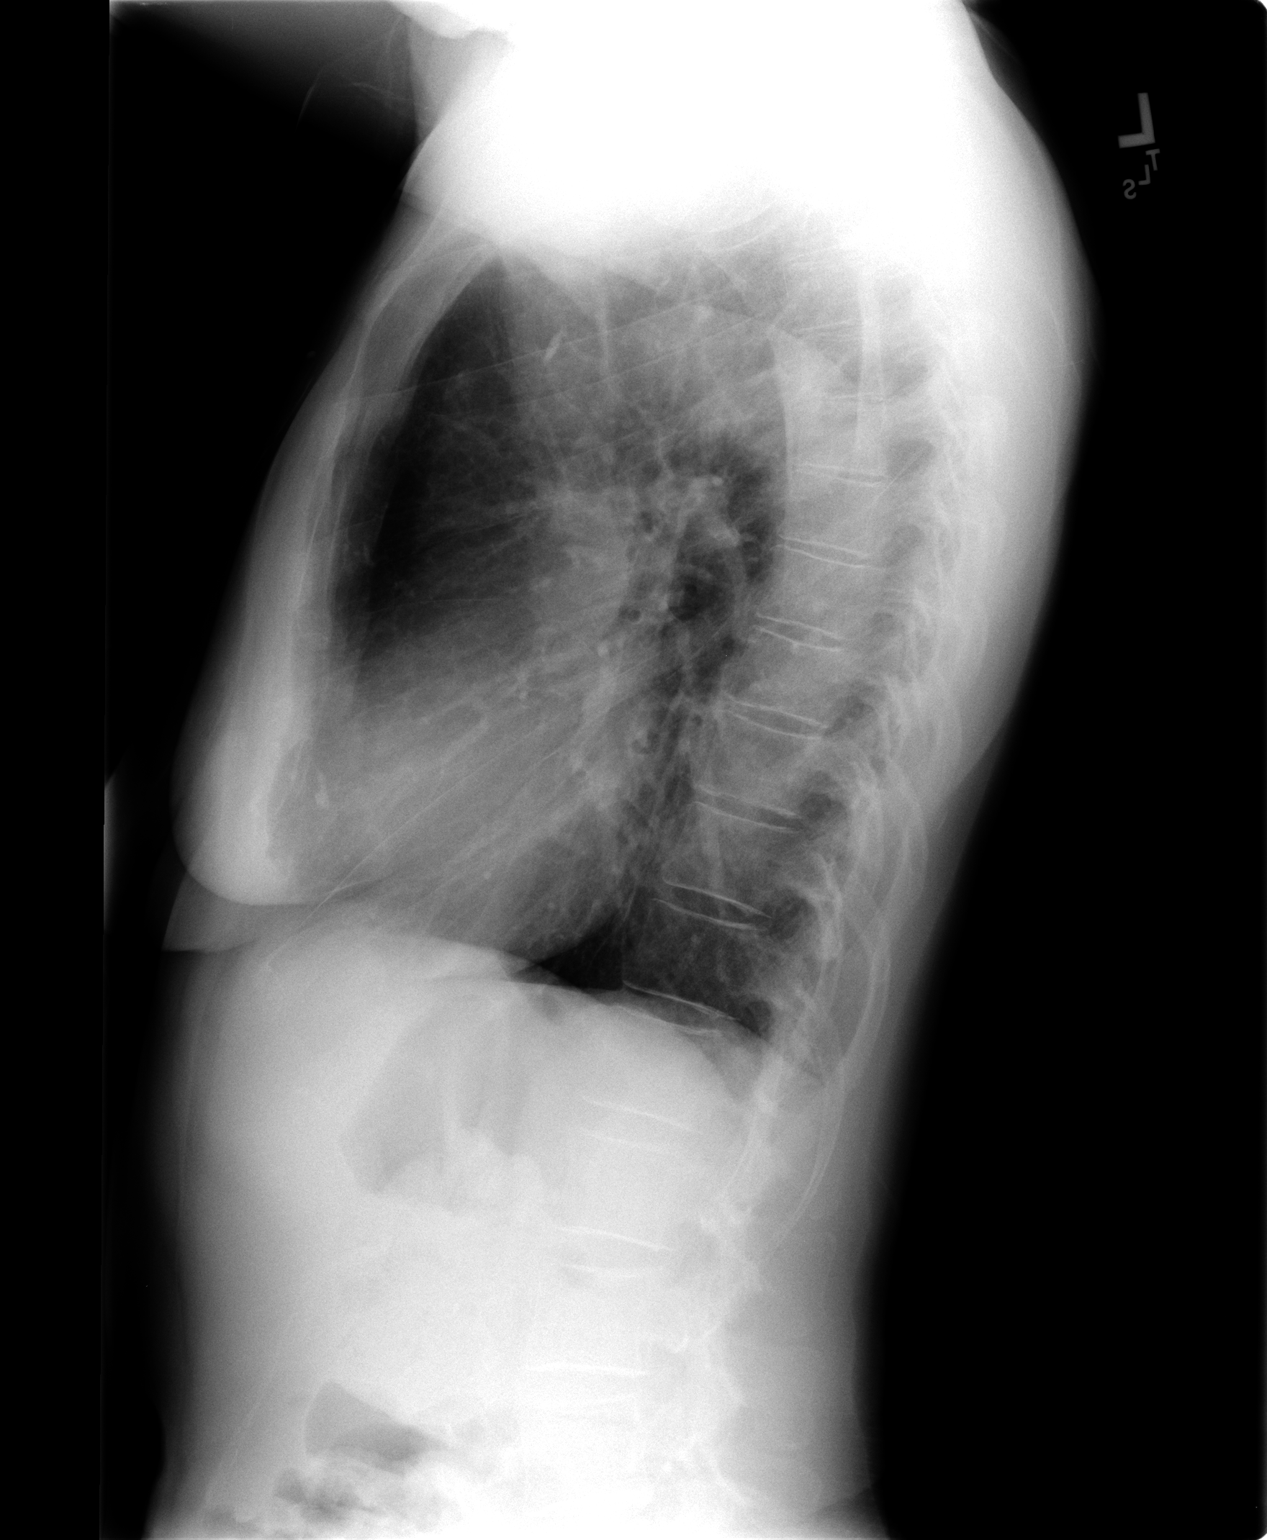

[2 of 2 positions shown; findings below may reference images not displayed]

FINDINGS: PA and lateral views of the chest are made and are compared to the previous studies of 11/26/03 and show stable diffuse peribronchial thickening compatible with chronic bronchitis.  The aorta is minimally elongated but not dilated or calcified.  The heart is normal.  The bony thorax shows no significant abnormality.
IMPRESSION: Mild stable diffuse peribronchial thickening compatible with a chronic bronchitis.  No active cardiac or pulmonary disease is seen.

## 2006-12-15 ENCOUNTER — Emergency Department (HOSPITAL_COMMUNITY): Admission: EM | Admit: 2006-12-15 | Discharge: 2006-12-15 | Payer: Self-pay | Admitting: Family Medicine

## 2006-12-19 ENCOUNTER — Ambulatory Visit (HOSPITAL_COMMUNITY): Admission: RE | Admit: 2006-12-19 | Discharge: 2006-12-19 | Payer: Self-pay | Admitting: Family Medicine

## 2007-02-19 IMAGING — CR DG HIP COMPLETE 2+V*R*
3 series · 3 of 3 positions shown · non-contrast
Comparison: 07/25/04 radiographs.

CLINICAL DATA: History of right hip fracture and osteomyelitis.  Pain and swelling.  
DIAGNOSTIC RIGHT HIP ? 3 VIEW:

[t pelvis a.p.]
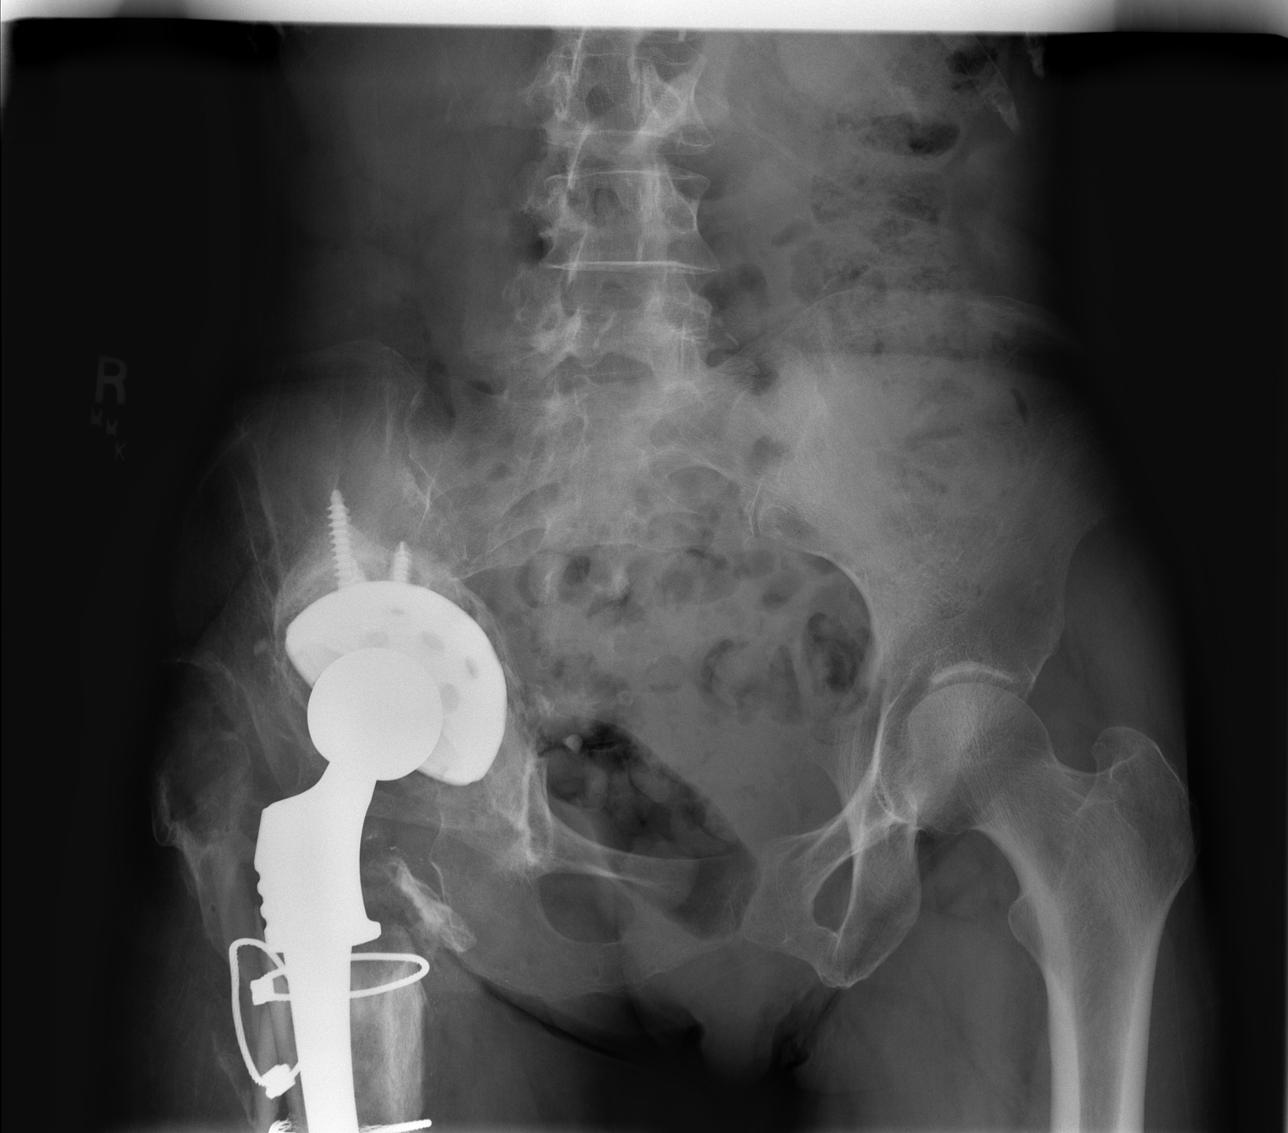

[t hip ap right]
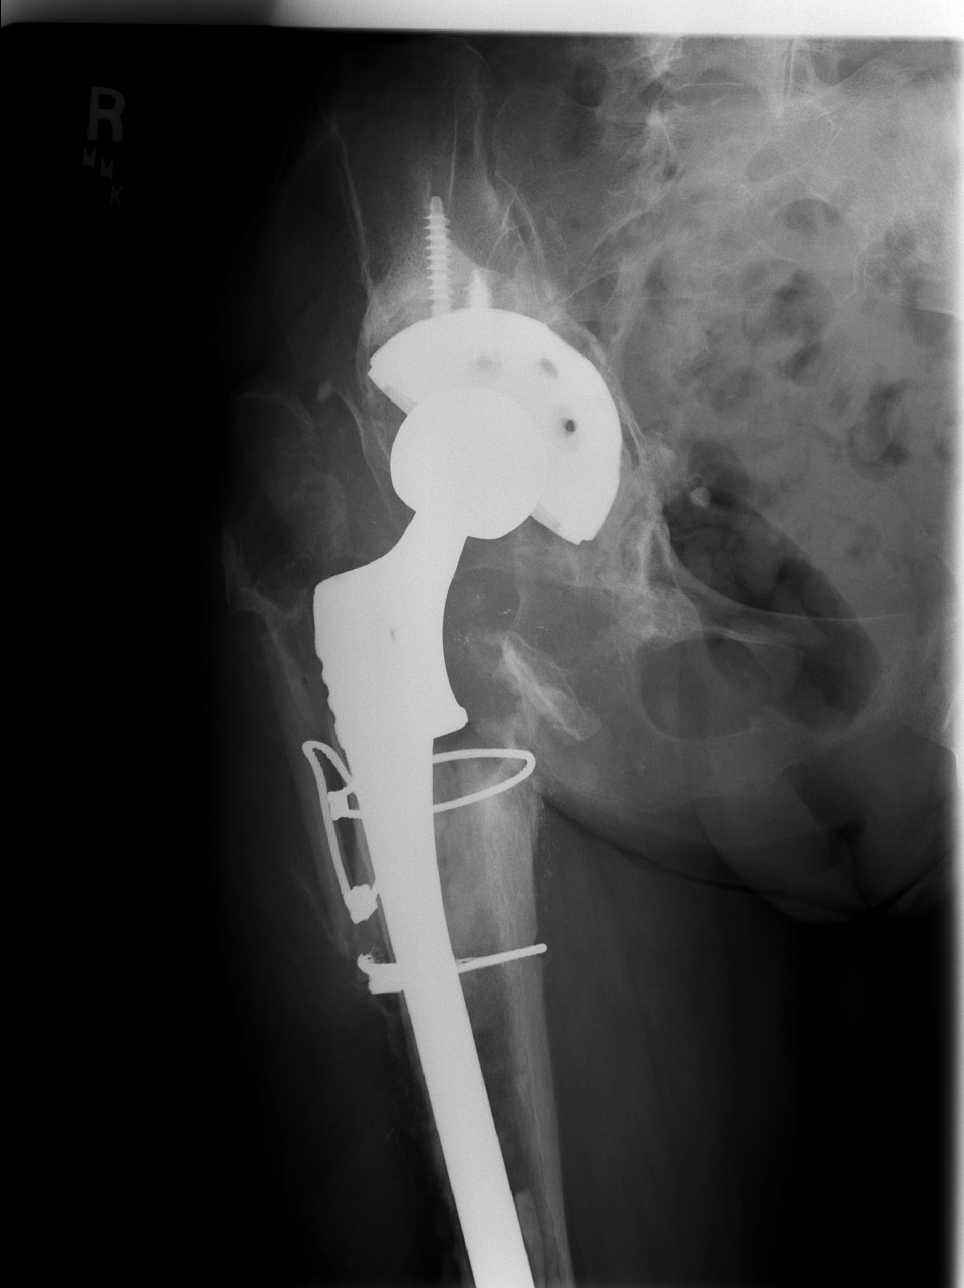

[t hip frog leg right]
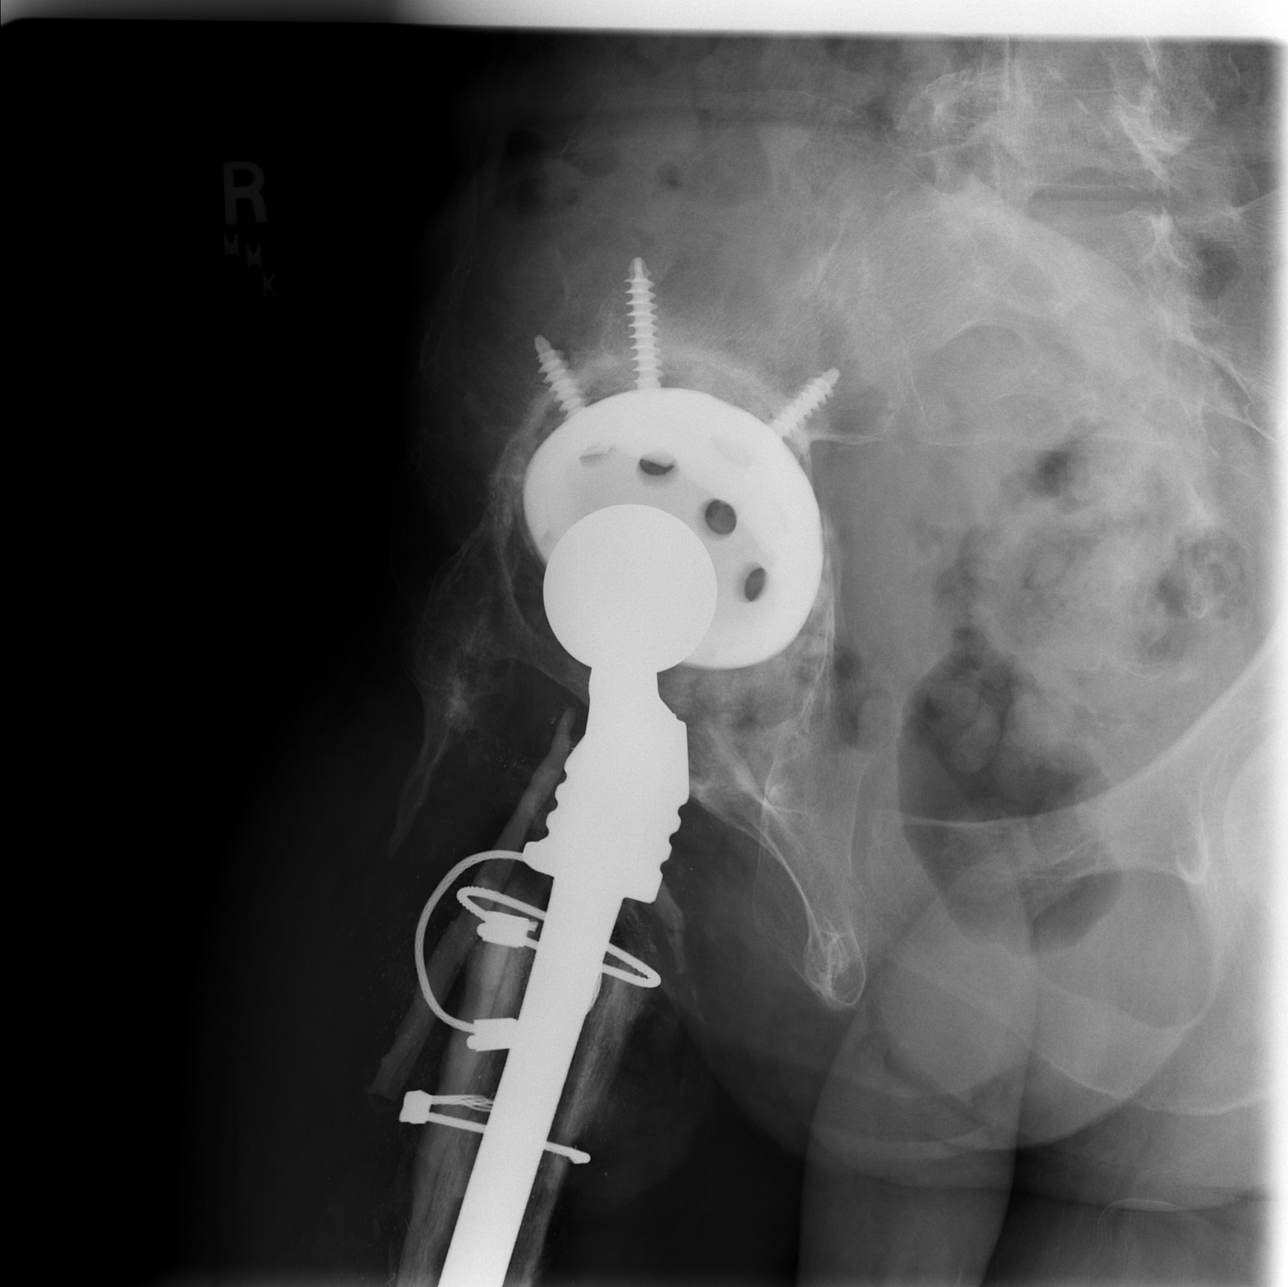

[3 of 3 positions shown; findings below may reference images not displayed]

FINDINGS: The patient is status-post right total hip replacement with a screw fixed acetabular component.   There is stable chronic acetabular protrusio.   of the cerclage wires is now displaced laterally, and the most distal cerclage wire appears fractured.  There is progressive lucency of the femoral cortex lateral to the femoral prosthesis with persistent fragmentation.  Based on the appearance demonstrated previously, this does not appear to reflect an acute fracture, but is compatible with hardware failure, possibly from loosening or superimposed infection.
IMPRESSION: At least two of the cerclage wires for the right femoral hip replacement have fractured, and there is progressive lucency and irregularity of the femoral cortex compatible with hardware failure.  This may be due to loosening or infection. 
RIGHT FEMUR ? 2 VIEW:
FINDINGS: As reported above, there is progressive lucency of the lateral femoral cortex in the midfemur.  There is lateral soft tissue swelling.  Findings are compatible with hardware failure from loosening or infection.
IMPRESSION: As above.
RIGHT KNEE ? 4 VIEW:
FINDINGS: Previous distal lateral compression screw plate has been removed.  The femoral prosthesis now extends more distally into the femoral medullary canal compared with the prior examination.  There is significant lucency surrounding the distal end of the prosthesis.   This may reflect infection or loosening.  No acute fracture is seen distally.
IMPRESSION: No evidence of acute fracture.  There is progressive lucency surrounding the distal end of the femoral prosthesis and distal migration compatible with hardware failure.

## 2007-02-19 IMAGING — CR DG KNEE COMPLETE 4+V*R*
4 series · 4 of 4 positions shown · non-contrast
Comparison: 07/25/04 radiographs.

CLINICAL DATA: History of right hip fracture and osteomyelitis.  Pain and swelling.  
DIAGNOSTIC RIGHT HIP ? 3 VIEW:

[t knee ap right]
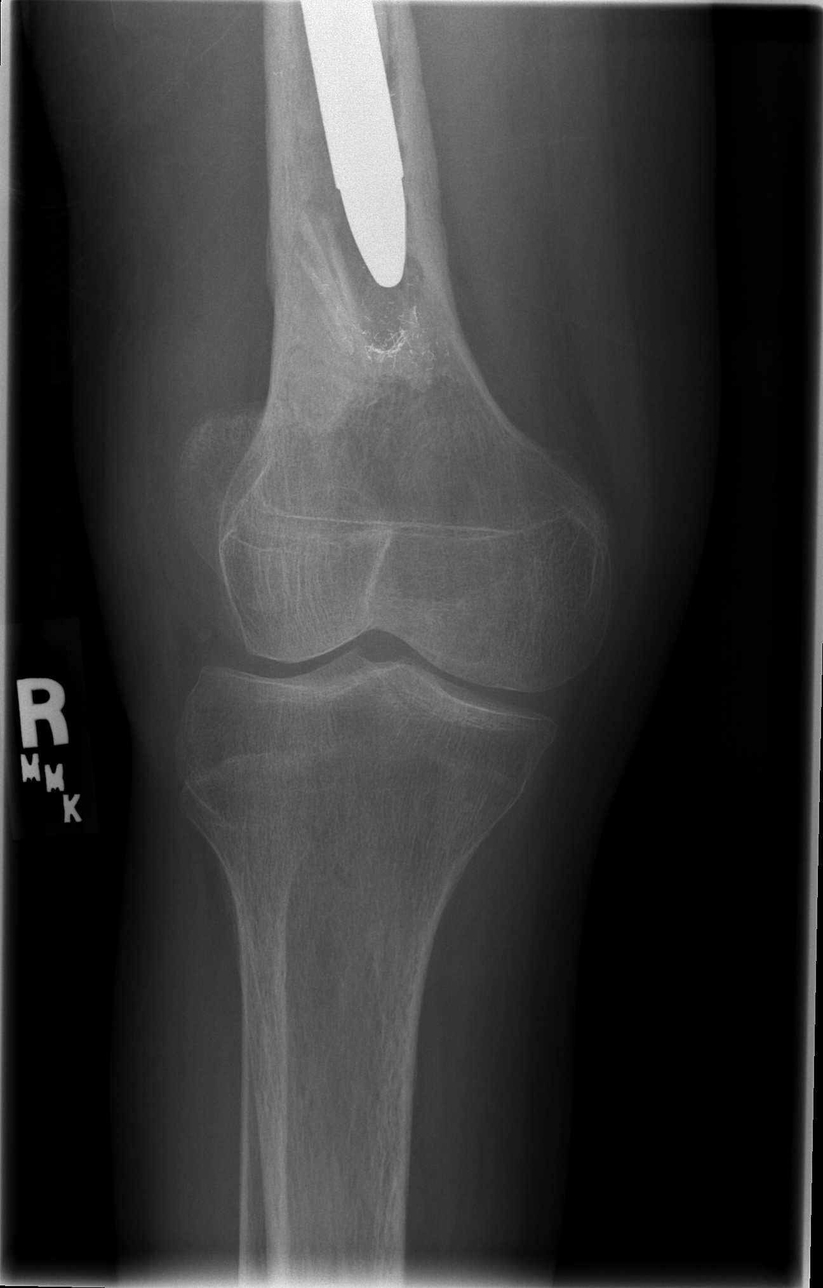

[t knee oblique right (1 of 2)]
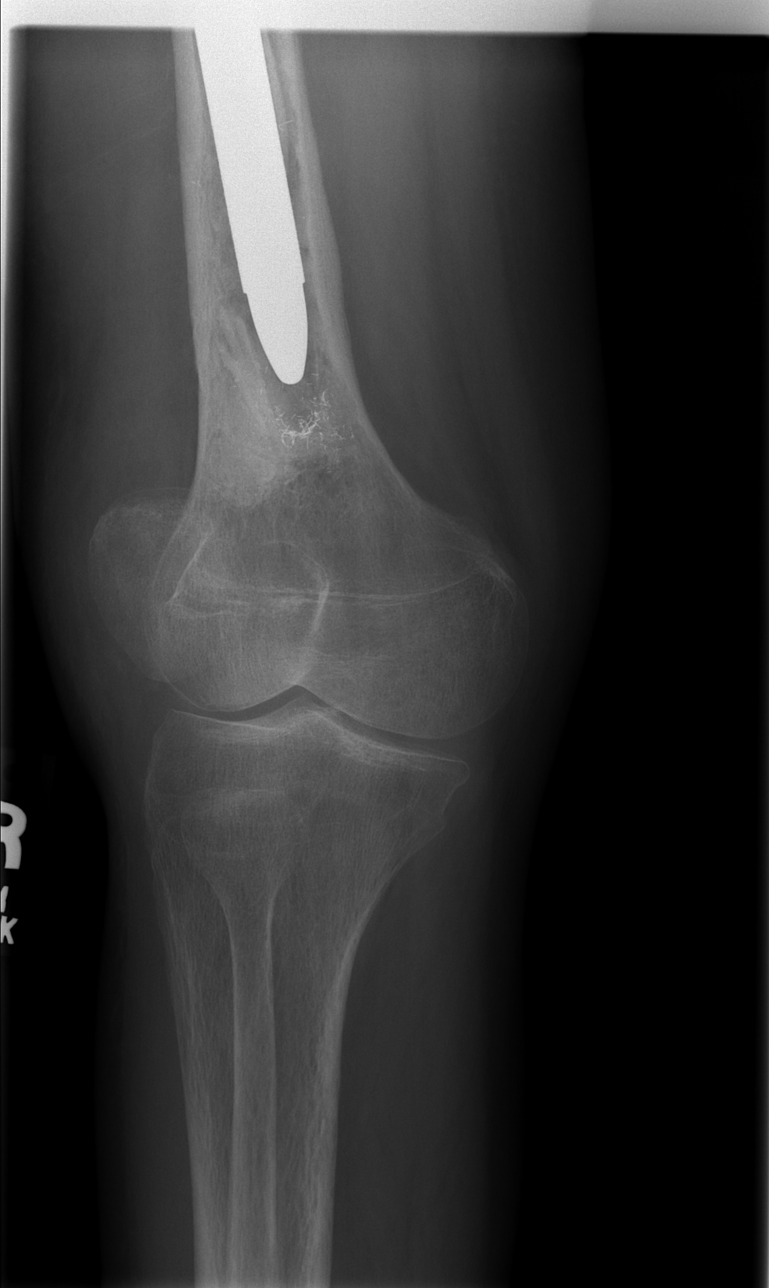

[t knee oblique right (2 of 2)]
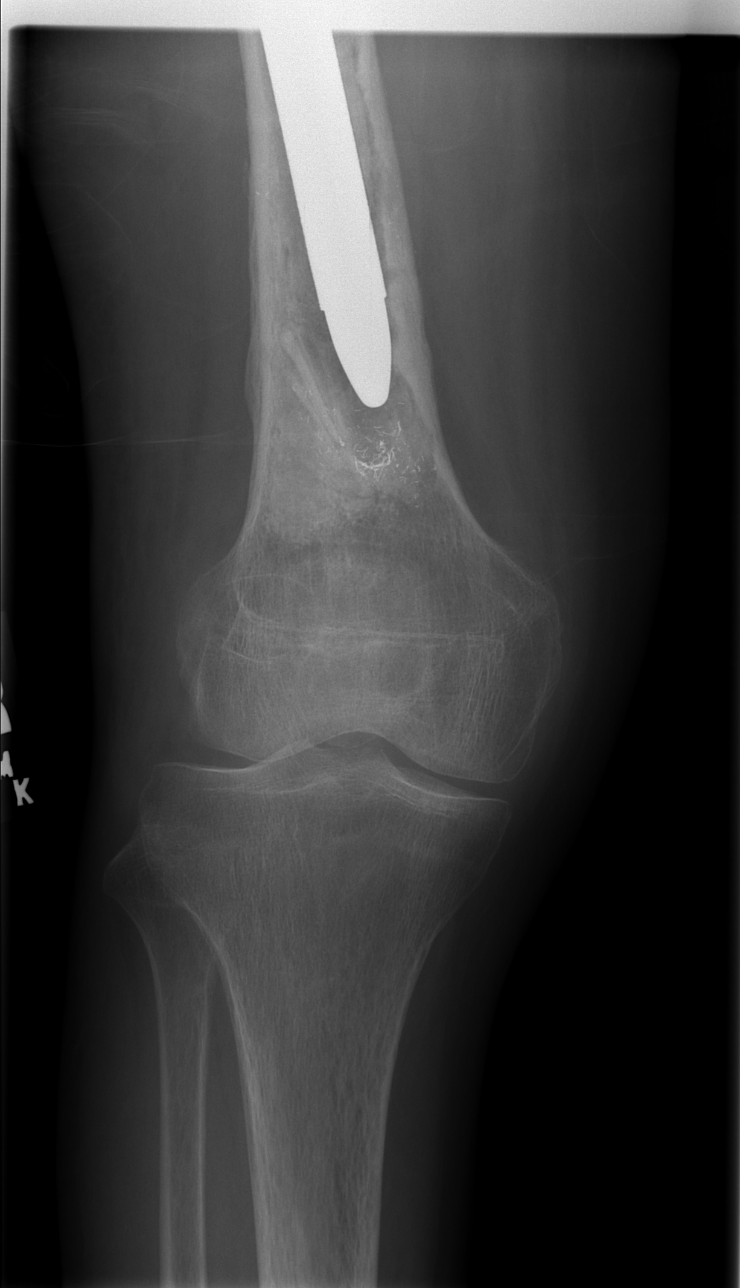

[t knee lat right]
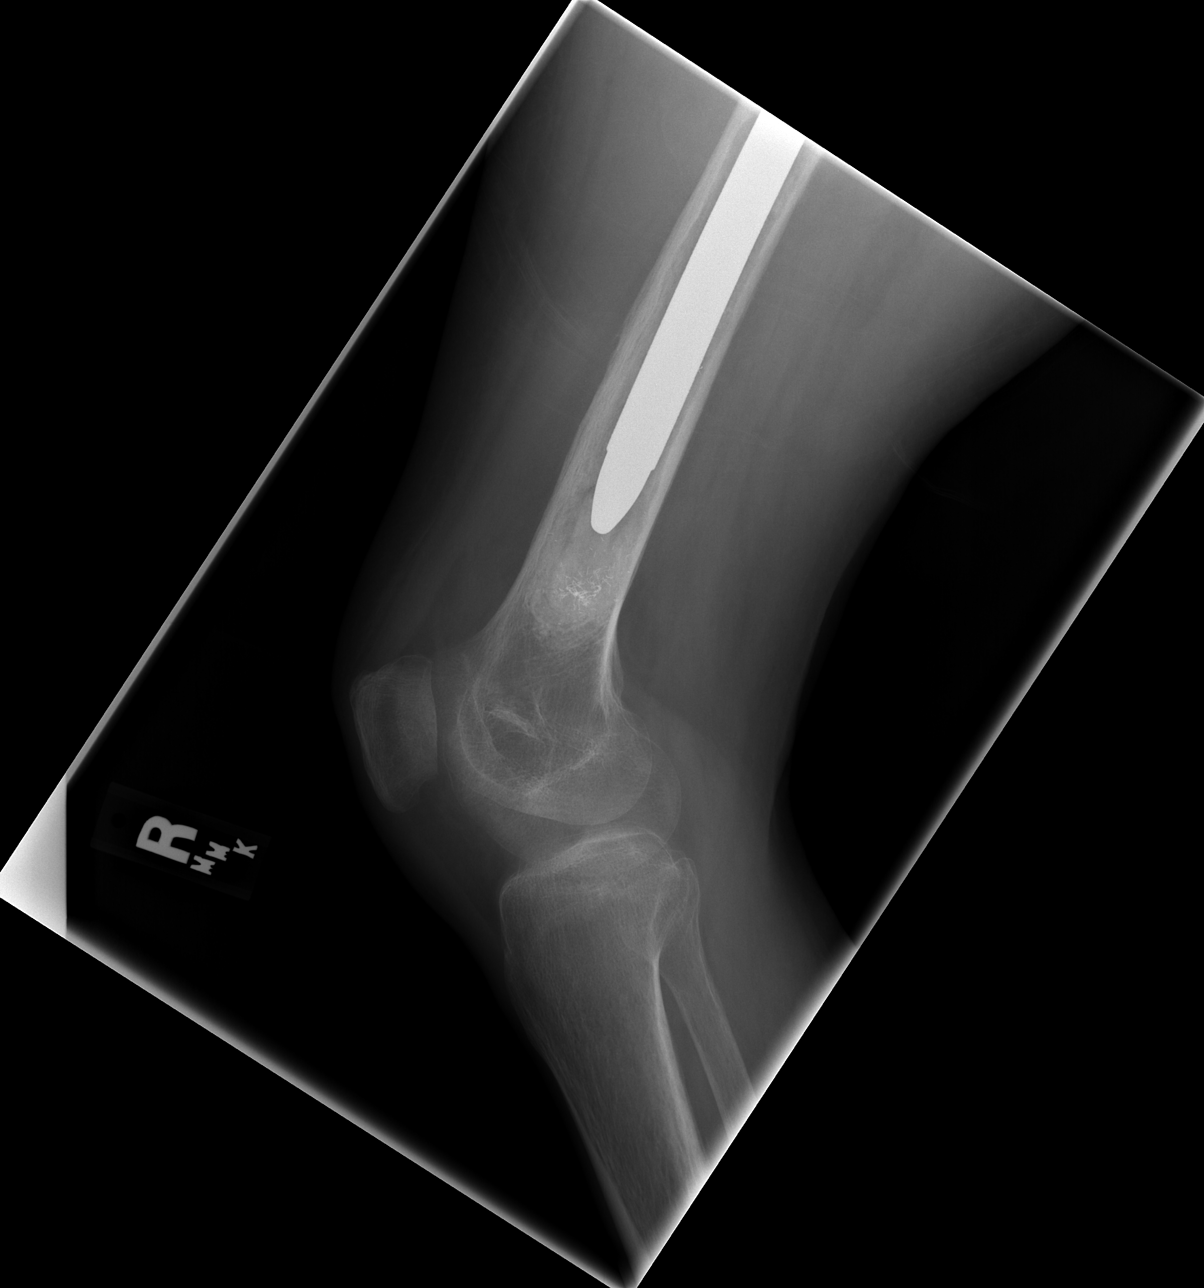

[4 of 4 positions shown; findings below may reference images not displayed]

FINDINGS: The patient is status-post right total hip replacement with a screw fixed acetabular component.   There is stable chronic acetabular protrusio.   of the cerclage wires is now displaced laterally, and the most distal cerclage wire appears fractured.  There is progressive lucency of the femoral cortex lateral to the femoral prosthesis with persistent fragmentation.  Based on the appearance demonstrated previously, this does not appear to reflect an acute fracture, but is compatible with hardware failure, possibly from loosening or superimposed infection.
IMPRESSION: At least two of the cerclage wires for the right femoral hip replacement have fractured, and there is progressive lucency and irregularity of the femoral cortex compatible with hardware failure.  This may be due to loosening or infection. 
RIGHT FEMUR ? 2 VIEW:
FINDINGS: As reported above, there is progressive lucency of the lateral femoral cortex in the midfemur.  There is lateral soft tissue swelling.  Findings are compatible with hardware failure from loosening or infection.
IMPRESSION: As above.
RIGHT KNEE ? 4 VIEW:
FINDINGS: Previous distal lateral compression screw plate has been removed.  The femoral prosthesis now extends more distally into the femoral medullary canal compared with the prior examination.  There is significant lucency surrounding the distal end of the prosthesis.   This may reflect infection or loosening.  No acute fracture is seen distally.
IMPRESSION: No evidence of acute fracture.  There is progressive lucency surrounding the distal end of the femoral prosthesis and distal migration compatible with hardware failure.

## 2007-02-19 IMAGING — CR DG FEMUR 2+V*R*
2 series · 2 of 2 positions shown · non-contrast
Comparison: 07/25/04 radiographs.

CLINICAL DATA: History of right hip fracture and osteomyelitis.  Pain and swelling.  
DIAGNOSTIC RIGHT HIP ? 3 VIEW:

[t femur with hip  ap right]
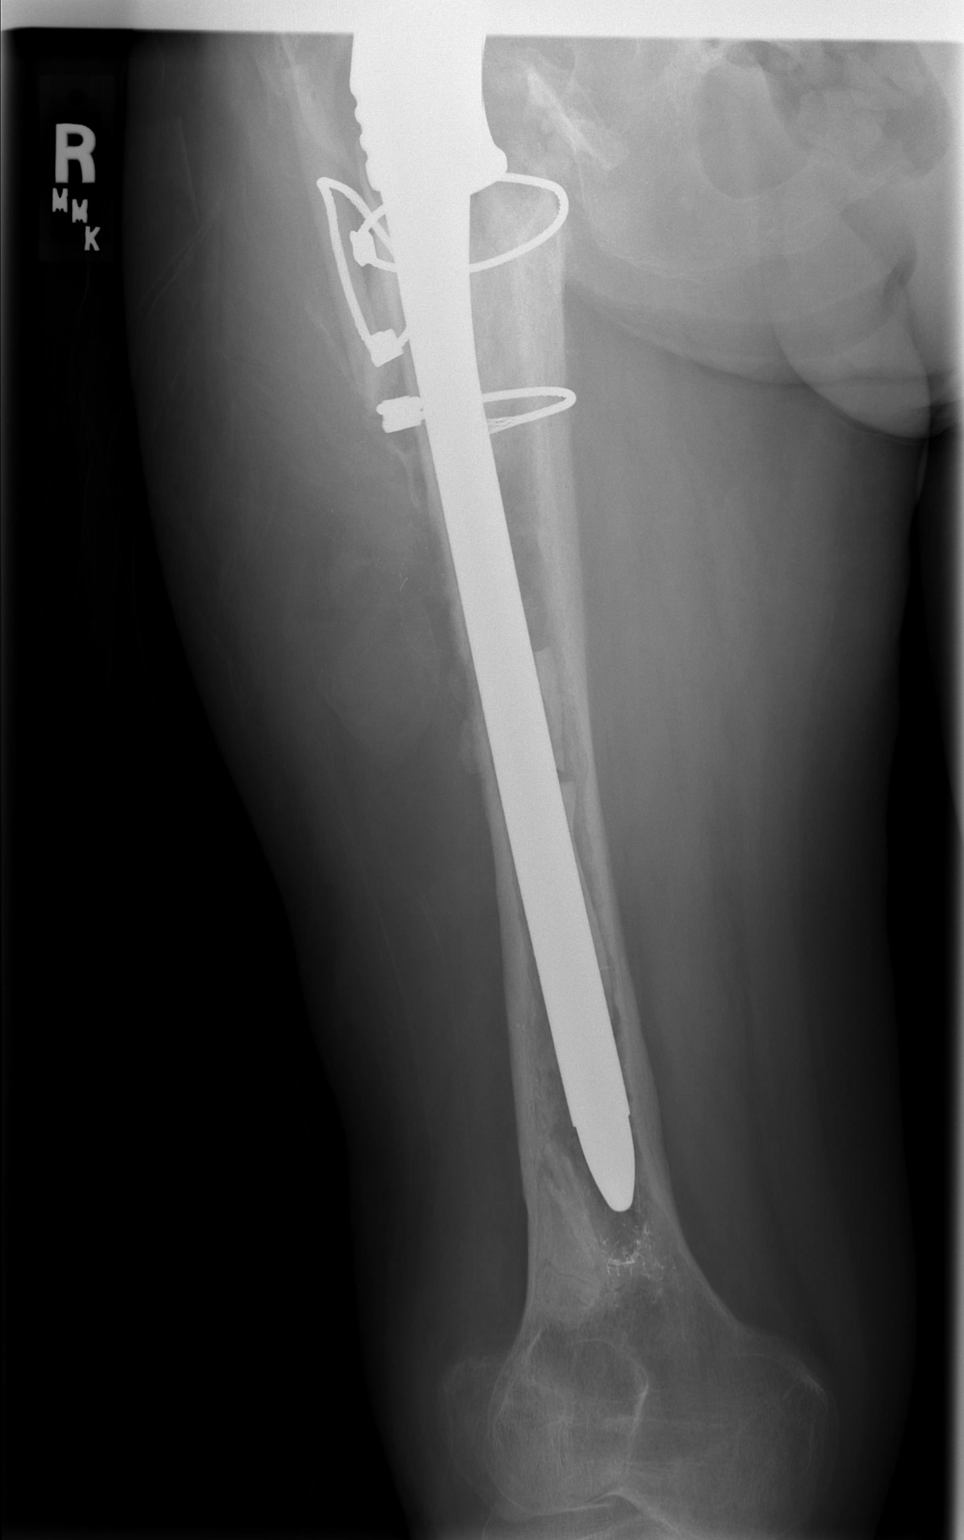

[t femur with hip lat right]
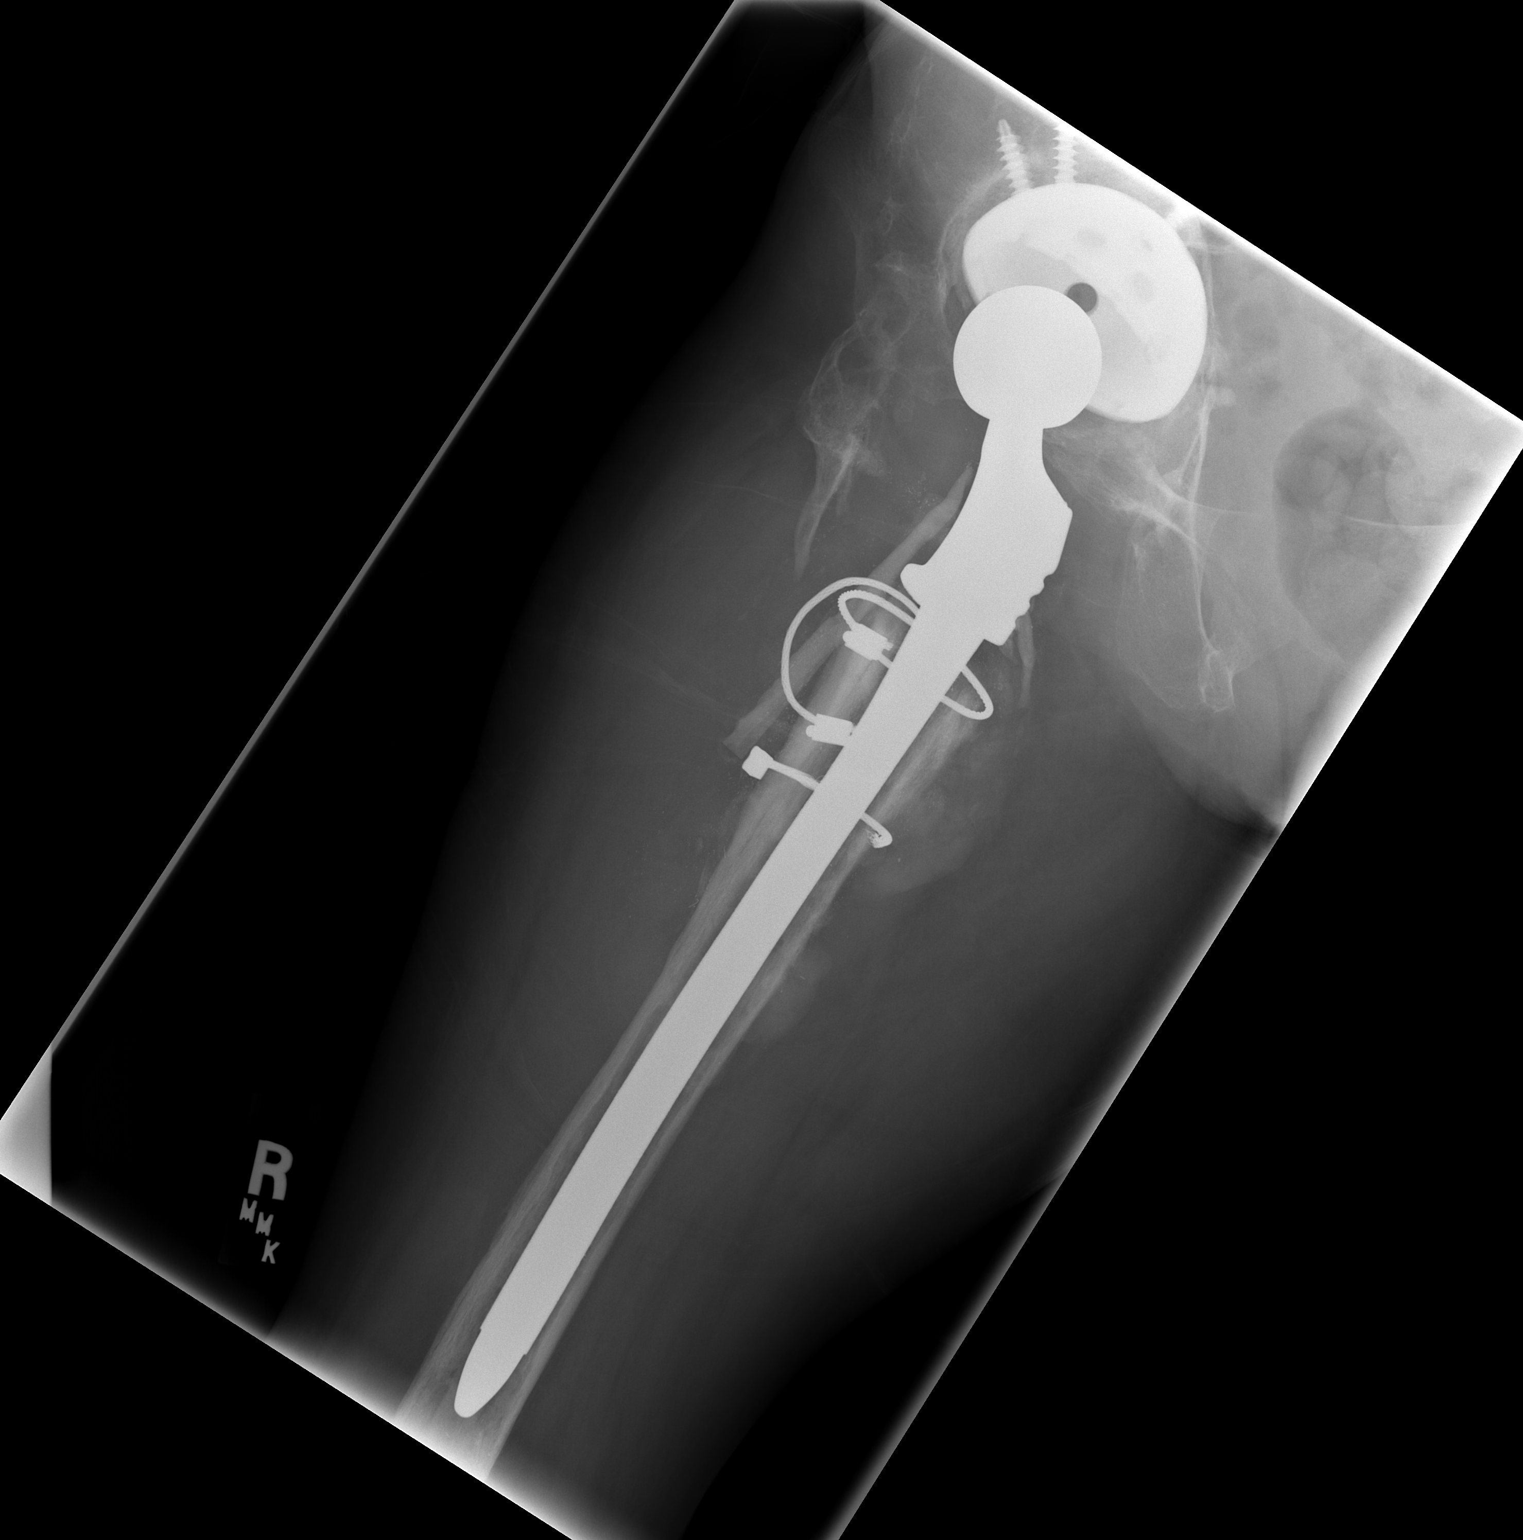

[2 of 2 positions shown; findings below may reference images not displayed]

FINDINGS: The patient is status-post right total hip replacement with a screw fixed acetabular component.   There is stable chronic acetabular protrusio.   of the cerclage wires is now displaced laterally, and the most distal cerclage wire appears fractured.  There is progressive lucency of the femoral cortex lateral to the femoral prosthesis with persistent fragmentation.  Based on the appearance demonstrated previously, this does not appear to reflect an acute fracture, but is compatible with hardware failure, possibly from loosening or superimposed infection.
IMPRESSION: At least two of the cerclage wires for the right femoral hip replacement have fractured, and there is progressive lucency and irregularity of the femoral cortex compatible with hardware failure.  This may be due to loosening or infection. 
RIGHT FEMUR ? 2 VIEW:
FINDINGS: As reported above, there is progressive lucency of the lateral femoral cortex in the midfemur.  There is lateral soft tissue swelling.  Findings are compatible with hardware failure from loosening or infection.
IMPRESSION: As above.
RIGHT KNEE ? 4 VIEW:
FINDINGS: Previous distal lateral compression screw plate has been removed.  The femoral prosthesis now extends more distally into the femoral medullary canal compared with the prior examination.  There is significant lucency surrounding the distal end of the prosthesis.   This may reflect infection or loosening.  No acute fracture is seen distally.
IMPRESSION: No evidence of acute fracture.  There is progressive lucency surrounding the distal end of the femoral prosthesis and distal migration compatible with hardware failure.

## 2007-03-20 ENCOUNTER — Emergency Department (HOSPITAL_COMMUNITY): Admission: EM | Admit: 2007-03-20 | Discharge: 2007-03-20 | Payer: Self-pay | Admitting: Family Medicine

## 2007-04-26 ENCOUNTER — Encounter (INDEPENDENT_AMBULATORY_CARE_PROVIDER_SITE_OTHER): Payer: Self-pay | Admitting: Otolaryngology

## 2007-04-26 ENCOUNTER — Ambulatory Visit (HOSPITAL_BASED_OUTPATIENT_CLINIC_OR_DEPARTMENT_OTHER): Admission: RE | Admit: 2007-04-26 | Discharge: 2007-04-26 | Payer: Self-pay | Admitting: Otolaryngology

## 2007-05-07 ENCOUNTER — Emergency Department (HOSPITAL_COMMUNITY): Admission: EM | Admit: 2007-05-07 | Discharge: 2007-05-07 | Payer: Self-pay | Admitting: Emergency Medicine

## 2007-06-07 ENCOUNTER — Emergency Department (HOSPITAL_COMMUNITY): Admission: EM | Admit: 2007-06-07 | Discharge: 2007-06-07 | Payer: Self-pay | Admitting: Emergency Medicine

## 2007-06-14 IMAGING — CR DG ABDOMEN ACUTE W/ 1V CHEST
3 series · 3 of 3 positions shown · non-contrast
Comparison: none

CLINICAL DATA: Abdominal pain, weakness, dizziness.
 ABDOMINAL SERIES:

[w chest pa]
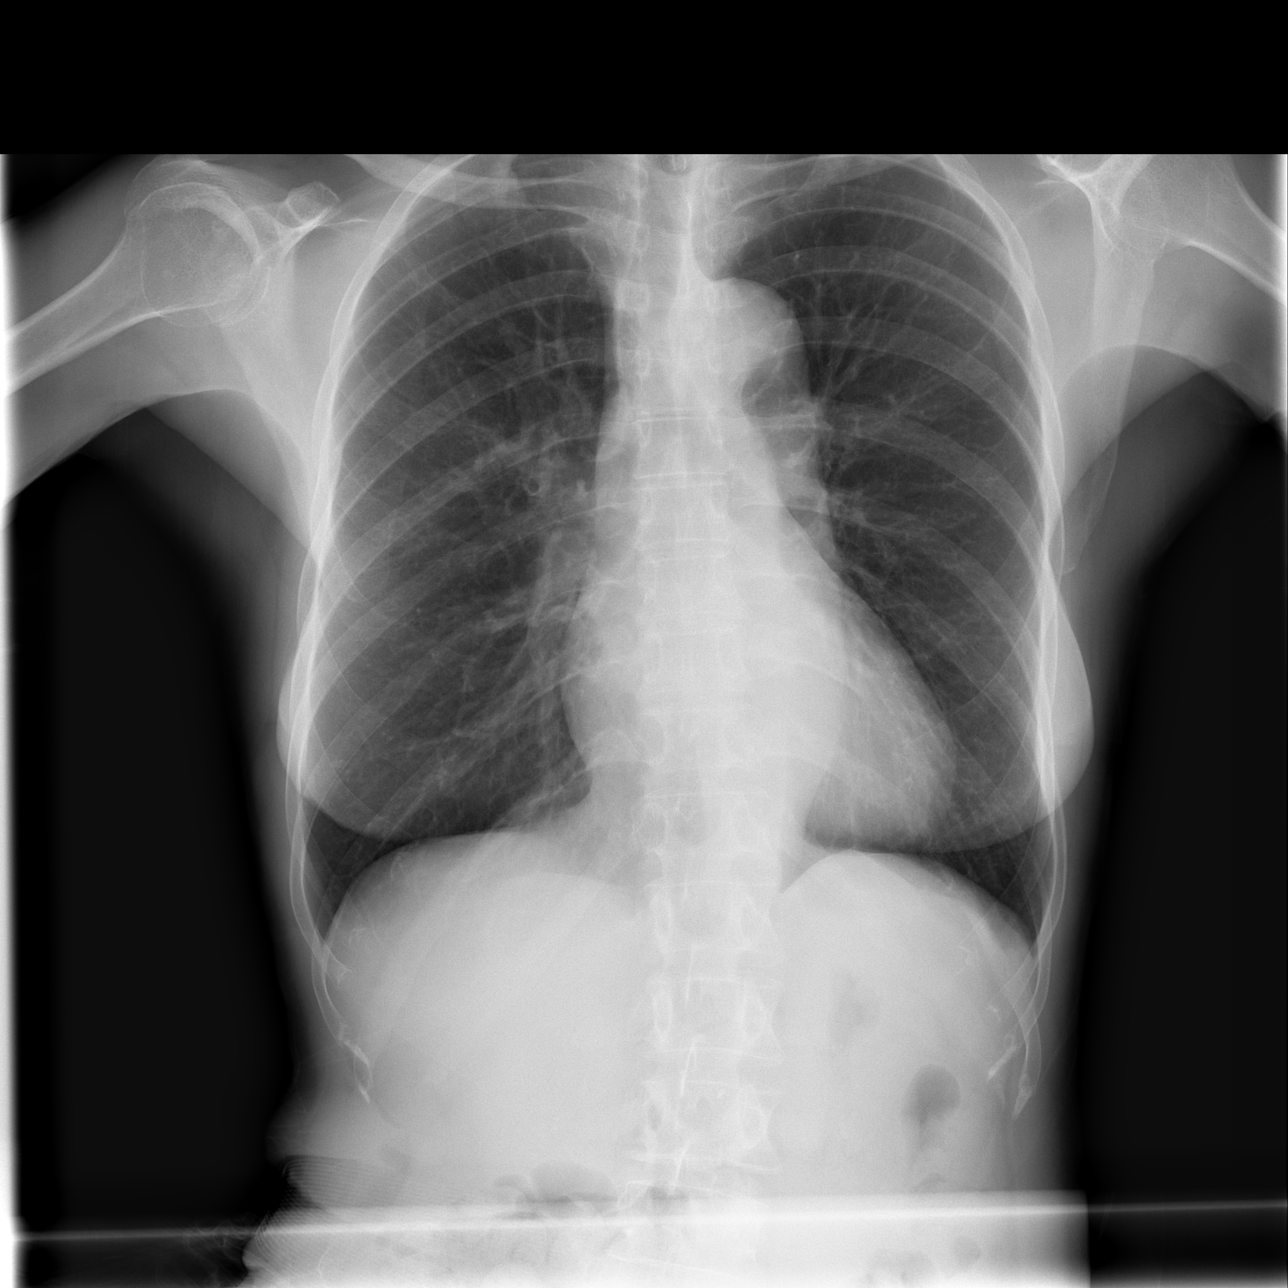

[w abdomen upright]
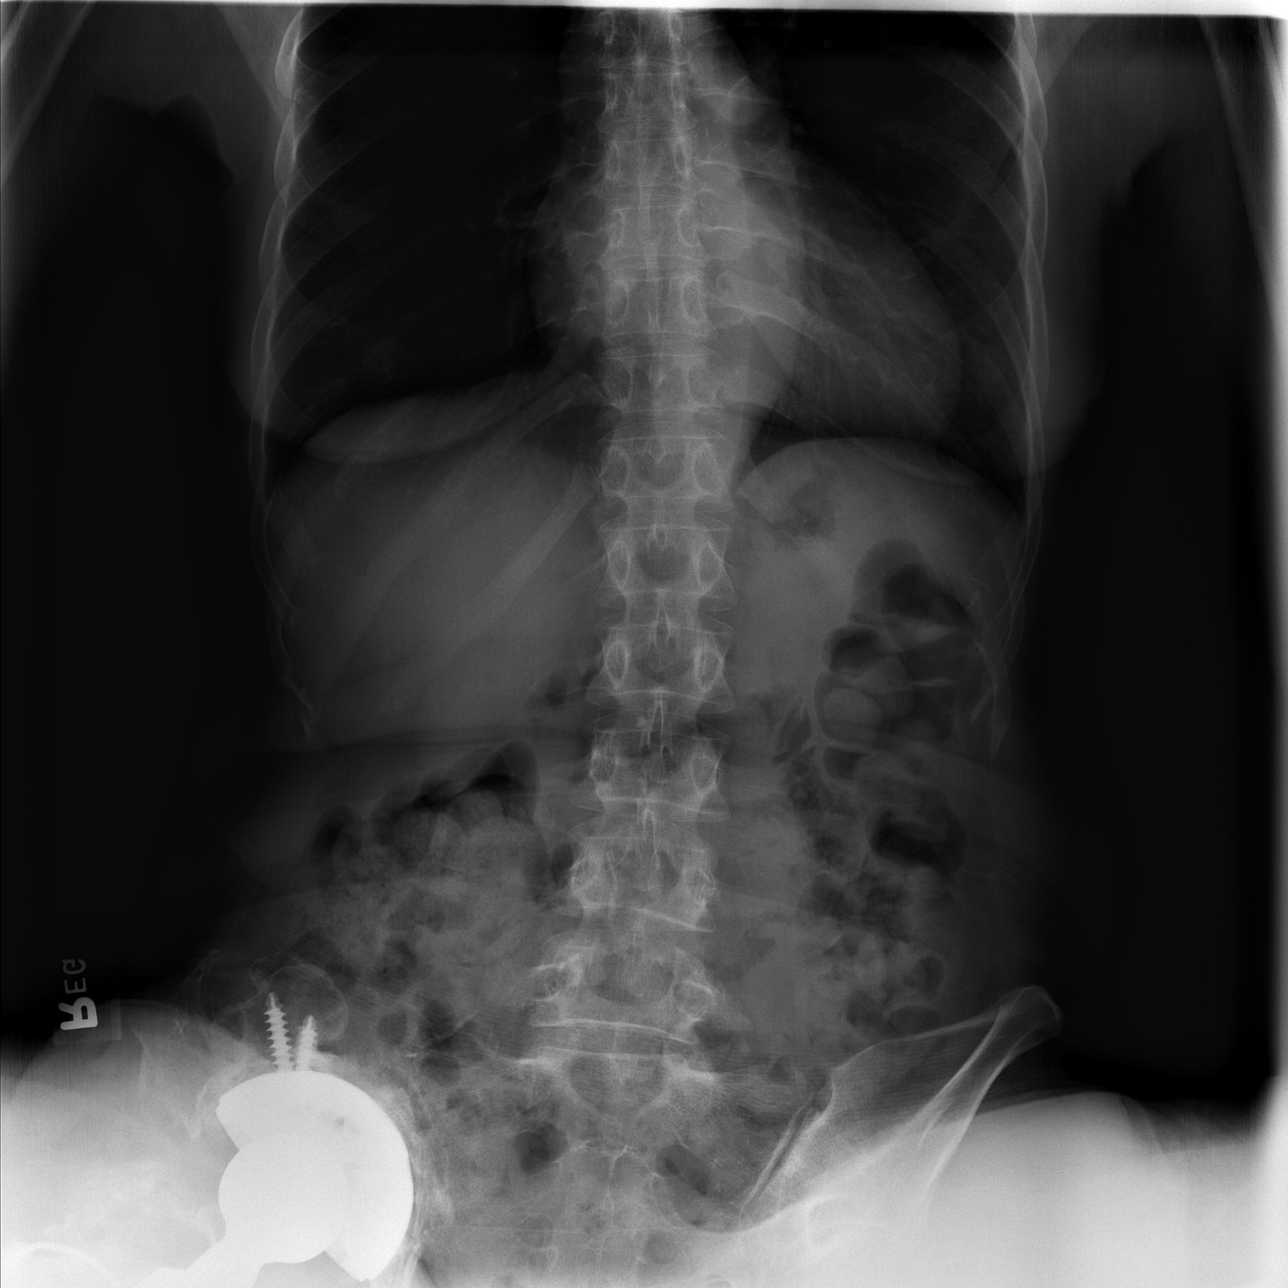

[t abdomen supine]
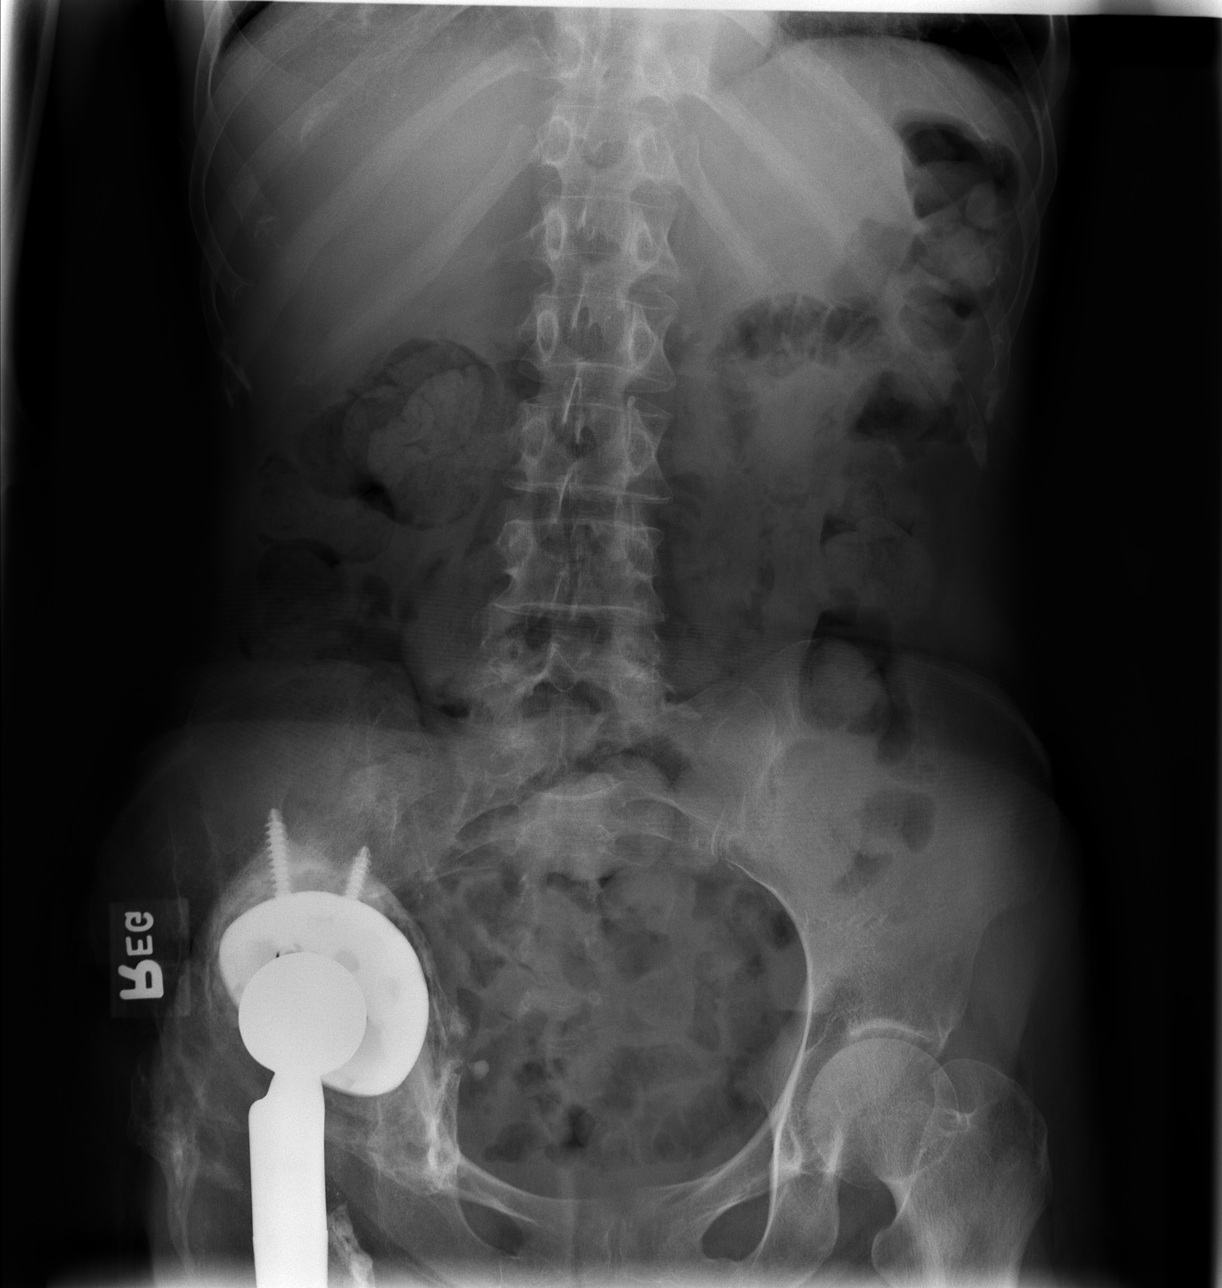

[3 of 3 positions shown; findings below may reference images not displayed]

FINDINGS: Supine and upright views show a large amount of fecal matter in the colon.  No sign of ileus or obstruction.  No sign of free air.  No worrisome calcifications.  Chronic changes of the right hemipelvis related to complicated hip replacement again noted.
 Heart and mediastinum are unremarkable.  Lungs are clear.  No effusions.  No free air under the diaphragm.
IMPRESSION: 1. No active disease in the chest.
 2. Large amount of fecal matter in the colon.

## 2007-08-20 IMAGING — CR DG SHOULDER 2+V BILAT
6 series · 6 of 6 positions shown · non-contrast
Comparison: none

CLINICAL DATA: Pain, weight loss. 
 LEFT SHOULDER ? 3 VIEW:

[w shoulder ap internal left (1 of 2)]
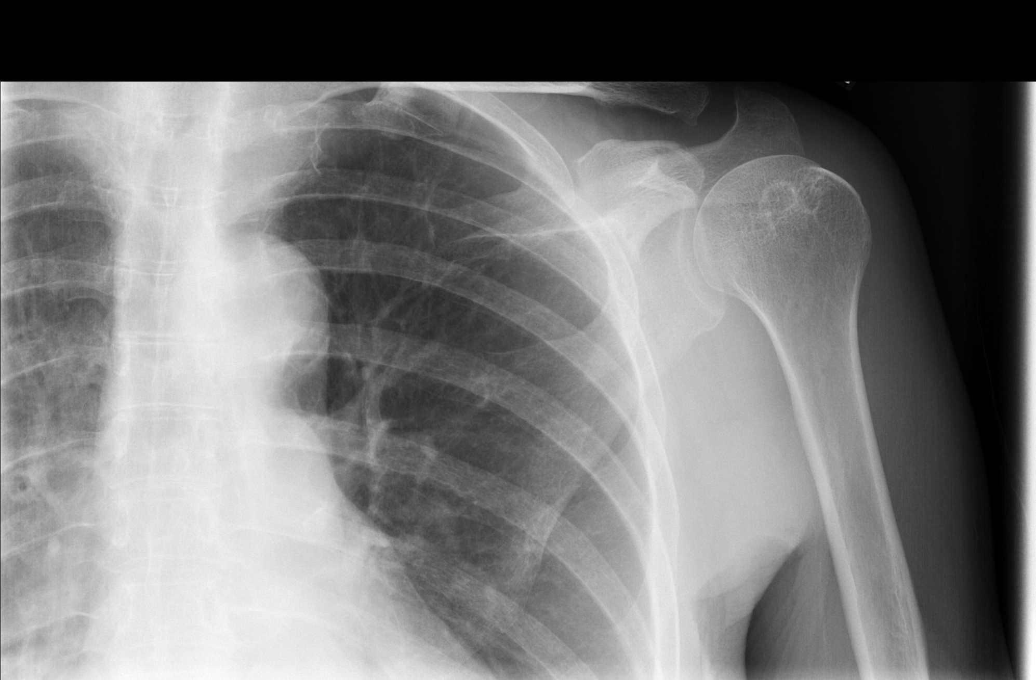

[w shoulder ap external left (1 of 2)]
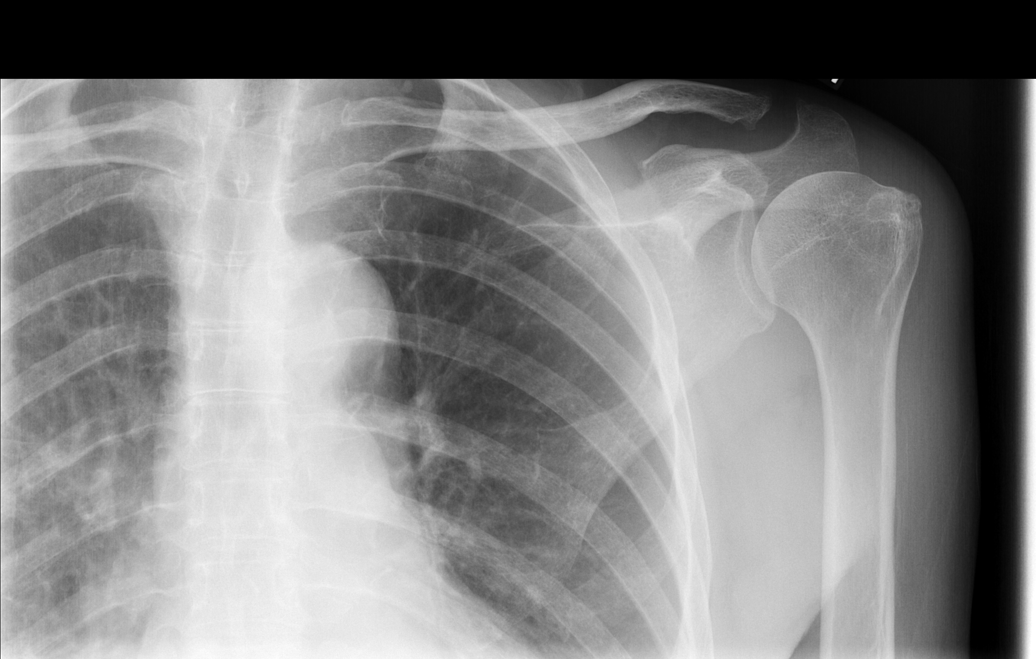

[w shoulder y view left (1 of 2)]
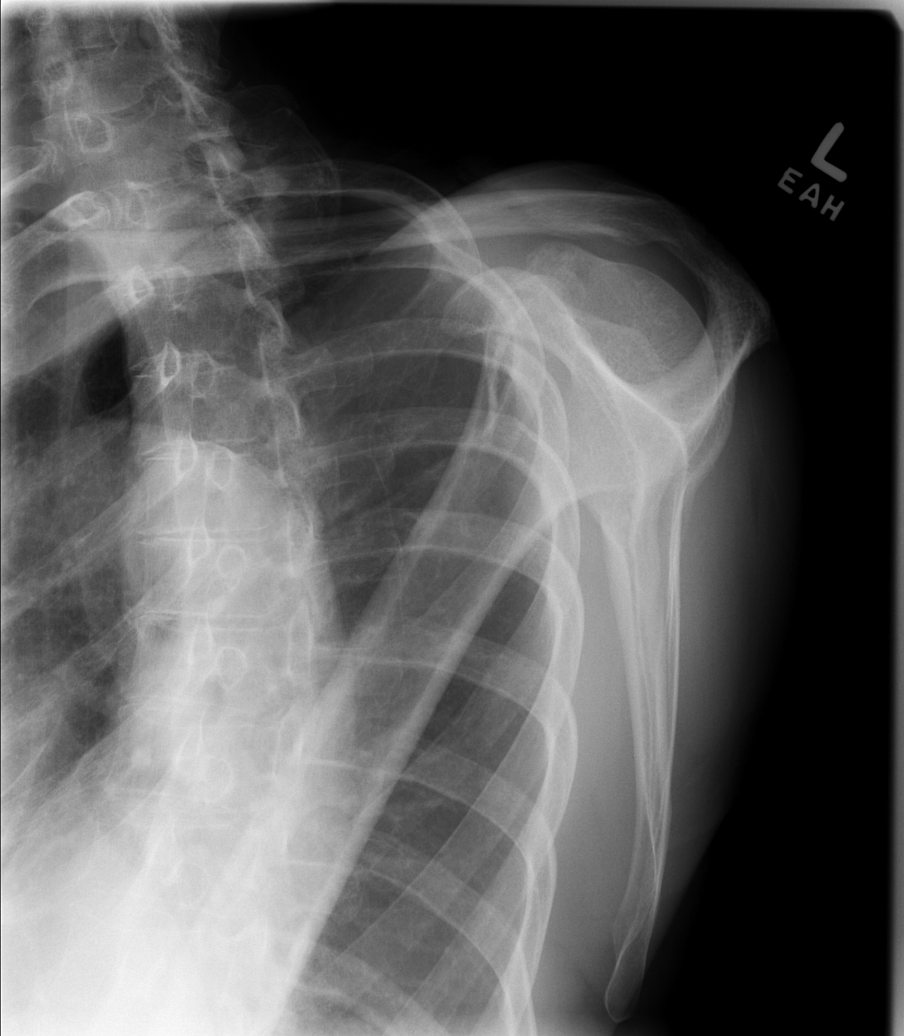

[w shoulder ap internal left (2 of 2)]
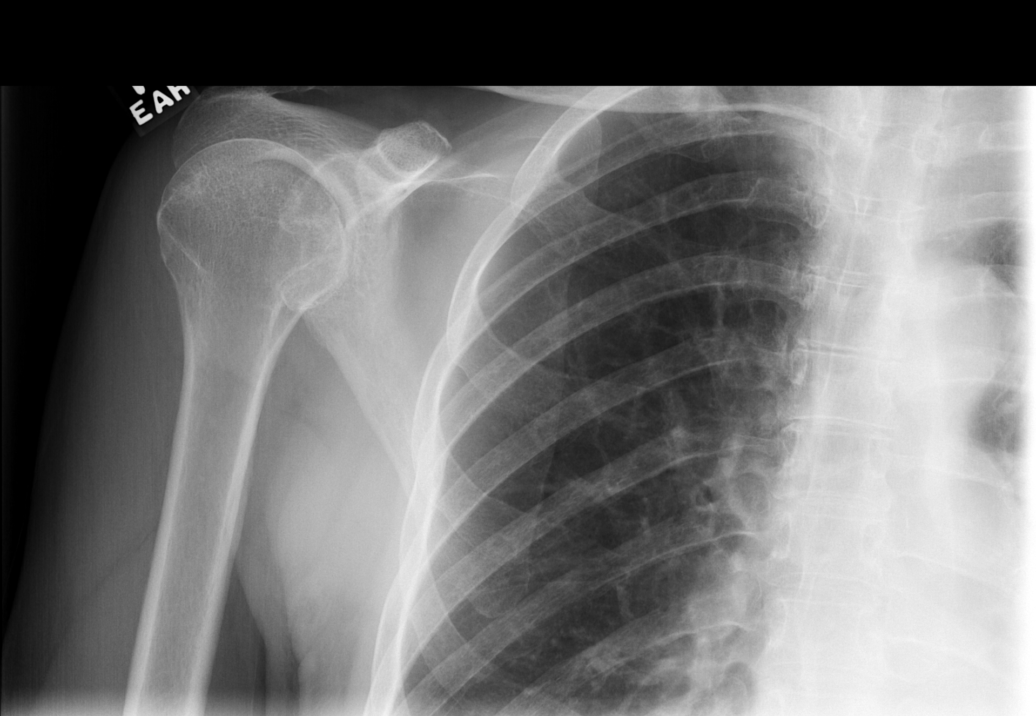

[w shoulder ap external left (2 of 2)]
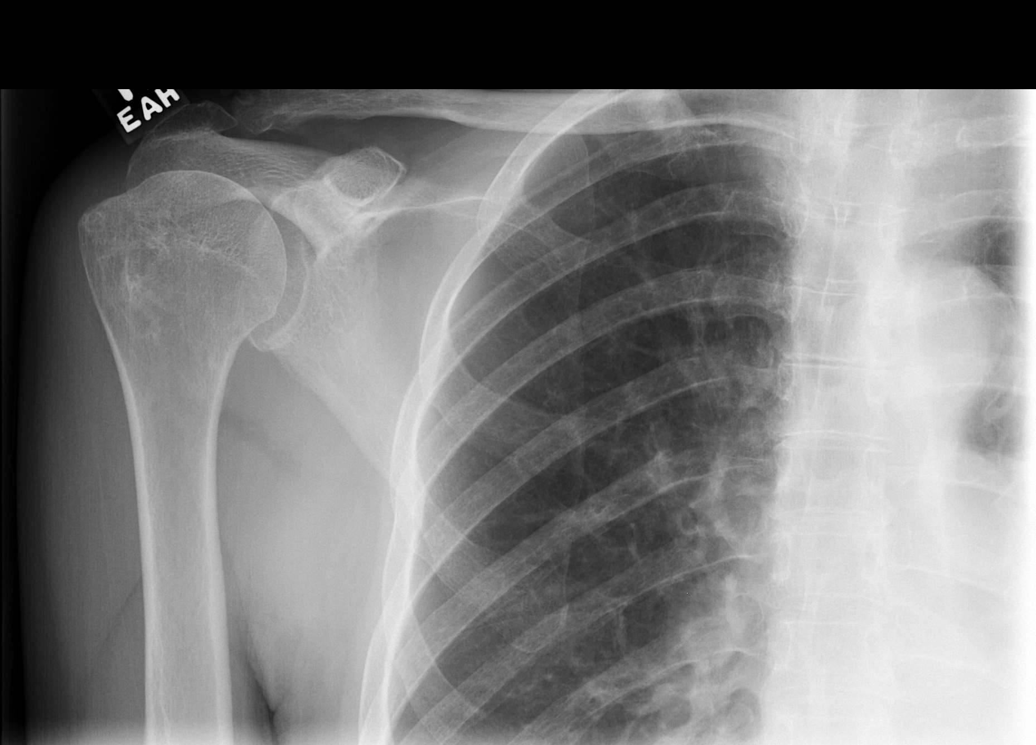

[w shoulder y view left (2 of 2)]
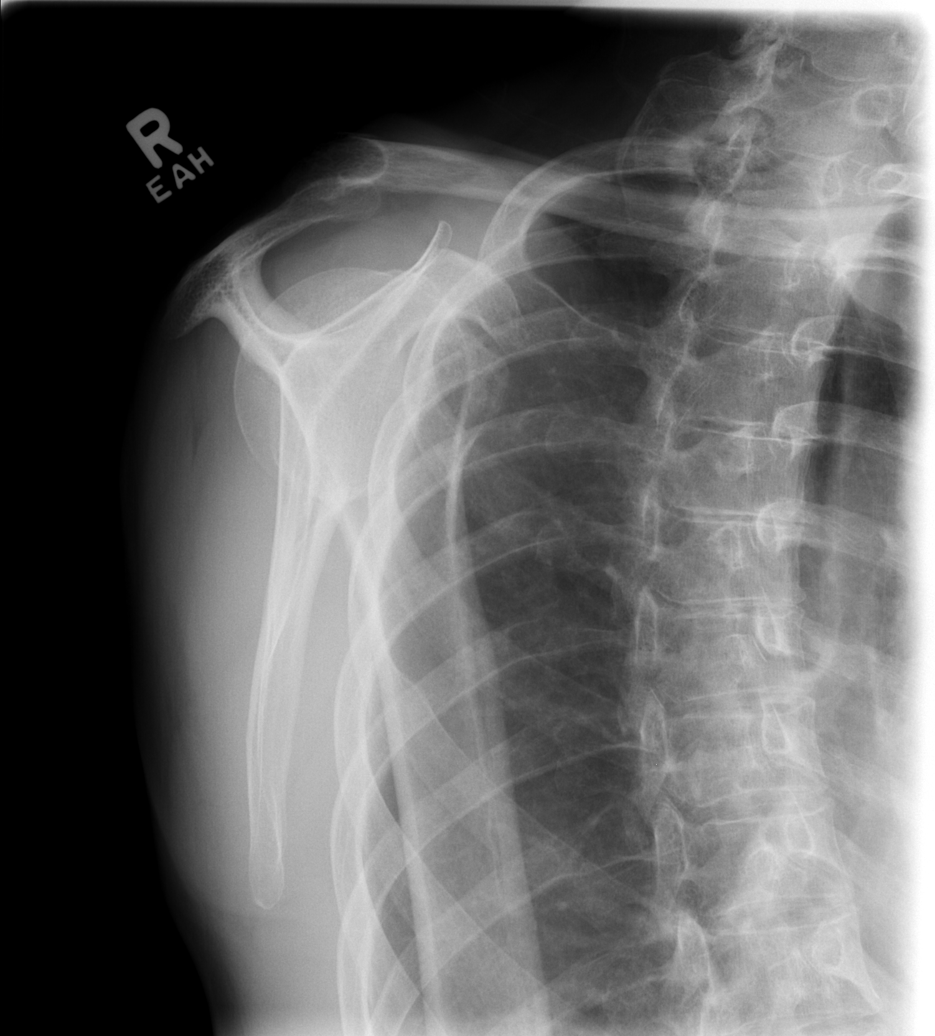

[6 of 6 positions shown; findings below may reference images not displayed]

FINDINGS: Three views of the left shoulder show no acute abnormality.  AC joint is normally aligned and the glenohumeral joint appears normal.
IMPRESSION: Negative left shoulder.
 RIGHT SHOULDER ? 2 VIEW:
FINDINGS: Two views of the right shoulder show no acute abnormality.  There is some loss of the subacromial space, which may indicate impingement. AC joint is normally aligned.
IMPRESSION: No acute abnormality. Cannot exclude impingement.

## 2007-08-20 IMAGING — CR DG CHEST 2V
2 series · 2 of 2 positions shown · non-contrast
Comparison: 09/02/05.

CLINICAL DATA: Pain, smoker, cough.
 CHEST ? 2 VIEW:

[w chest pa]
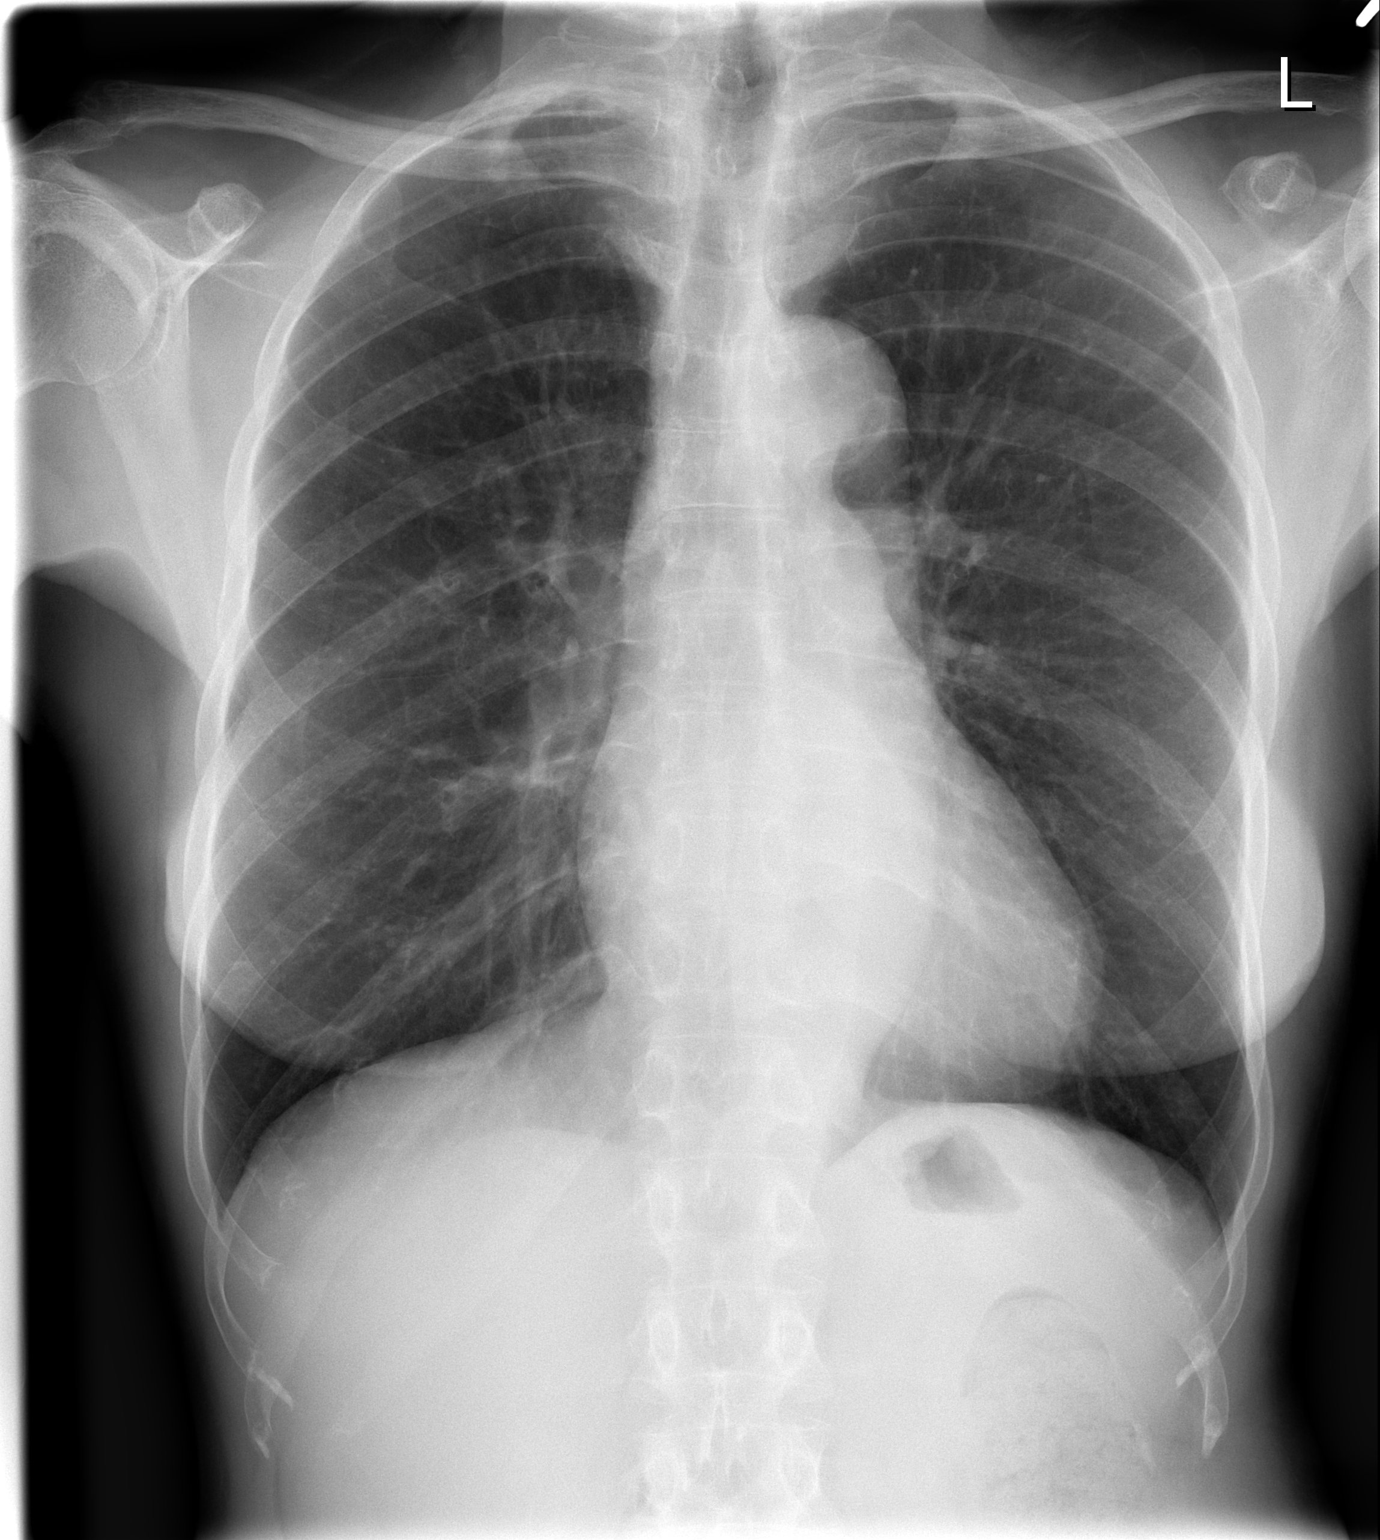

[w chest lat]
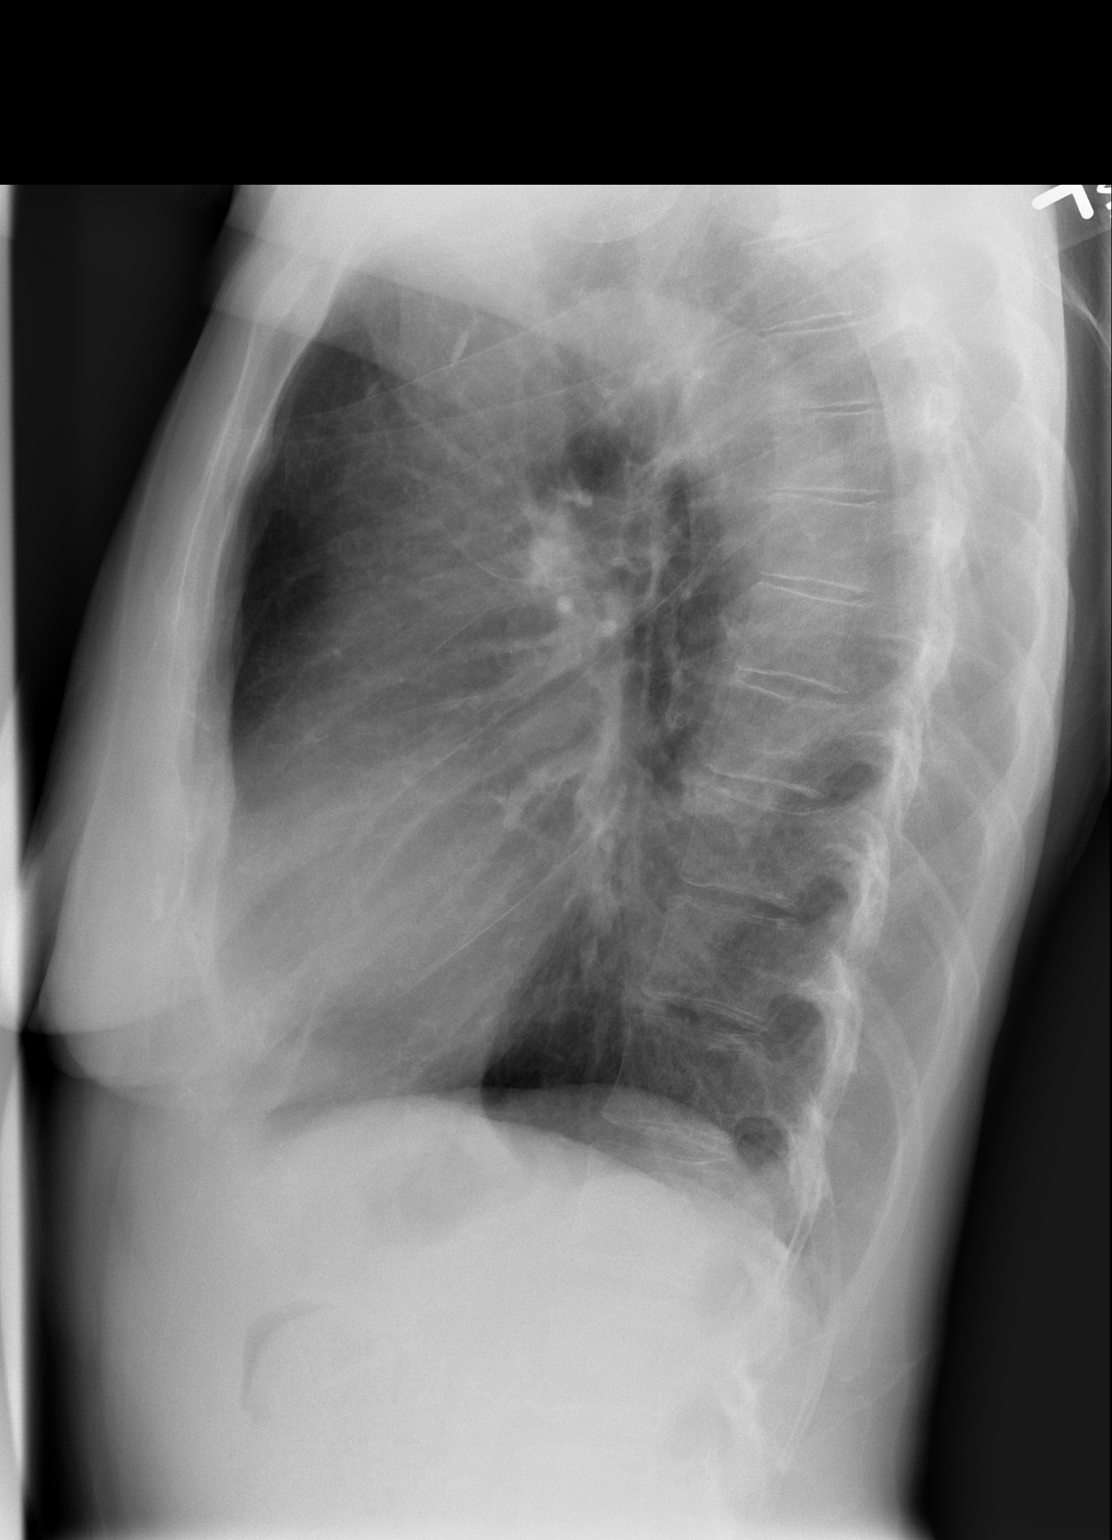

[2 of 2 positions shown; findings below may reference images not displayed]

FINDINGS: Two views of the chest show the lungs to be hyperaerated consistent with COPD.  No active infiltrate or effusion is seen.  The heart is within normal limits in size.
IMPRESSION: COPD.  No active lung disease.

## 2007-09-01 ENCOUNTER — Emergency Department (HOSPITAL_COMMUNITY): Admission: EM | Admit: 2007-09-01 | Discharge: 2007-09-01 | Payer: Self-pay | Admitting: Family Medicine

## 2007-09-14 ENCOUNTER — Emergency Department (HOSPITAL_COMMUNITY): Admission: EM | Admit: 2007-09-14 | Discharge: 2007-09-14 | Payer: Self-pay | Admitting: Emergency Medicine

## 2007-09-16 ENCOUNTER — Emergency Department (HOSPITAL_COMMUNITY): Admission: EM | Admit: 2007-09-16 | Discharge: 2007-09-16 | Payer: Self-pay | Admitting: Emergency Medicine

## 2007-10-04 ENCOUNTER — Emergency Department (HOSPITAL_COMMUNITY): Admission: EM | Admit: 2007-10-04 | Discharge: 2007-10-04 | Payer: Self-pay | Admitting: Emergency Medicine

## 2007-12-05 ENCOUNTER — Ambulatory Visit (HOSPITAL_COMMUNITY): Admission: RE | Admit: 2007-12-05 | Discharge: 2007-12-05 | Payer: Self-pay | Admitting: Cardiology

## 2007-12-21 ENCOUNTER — Encounter: Admission: RE | Admit: 2007-12-21 | Discharge: 2007-12-21 | Payer: Self-pay | Admitting: Cardiology

## 2008-01-27 IMAGING — US US ABDOMEN COMPLETE
1 series · 14 of 25 positions shown · non-contrast
Comparison: none

CLINICAL DATA: Abdominal pain.
 ABDOMEN ULTRASOUND:
TECHNIQUE: Complete abdominal ultrasound examination was performed including evaluation of the liver, gallbladder, bile ducts, pancreas, kidneys, spleen, IVC, and abdominal aorta.

[Series 1: unknown · 0.27mm/px · 14 of 59 slices shown]
[im 1/59]
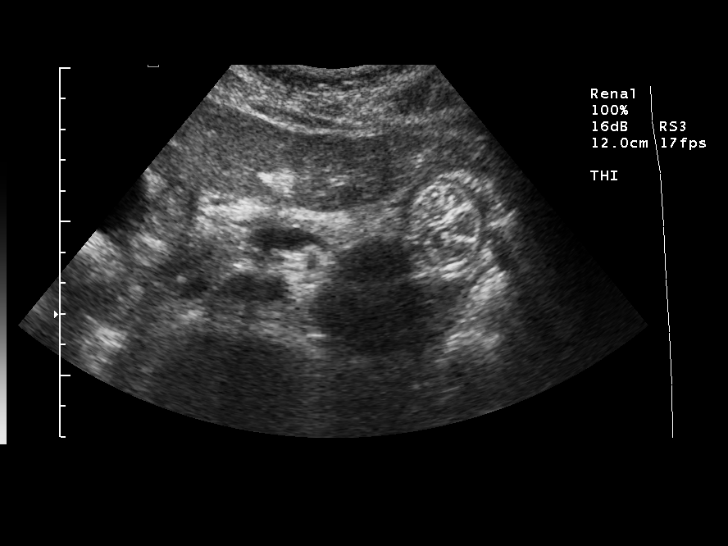
[im 5/59]
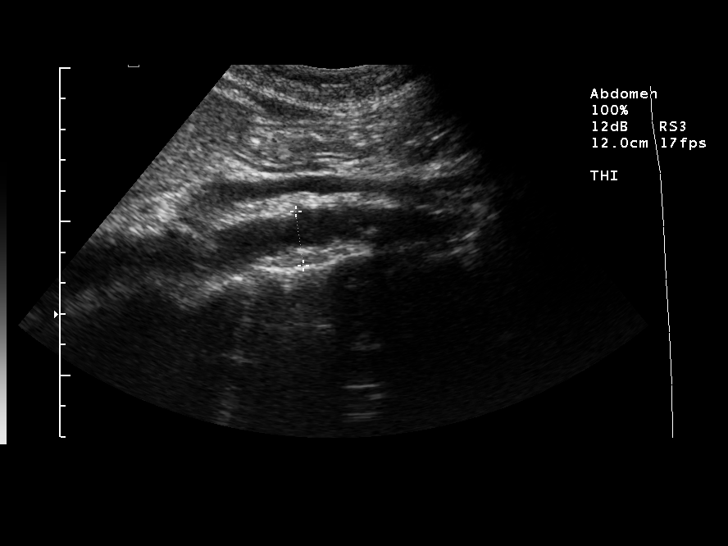
[im 10/59]
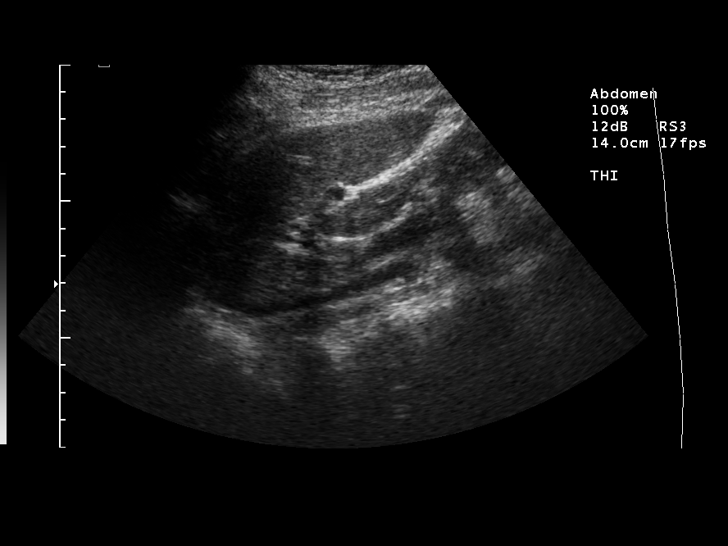
[im 15/59]
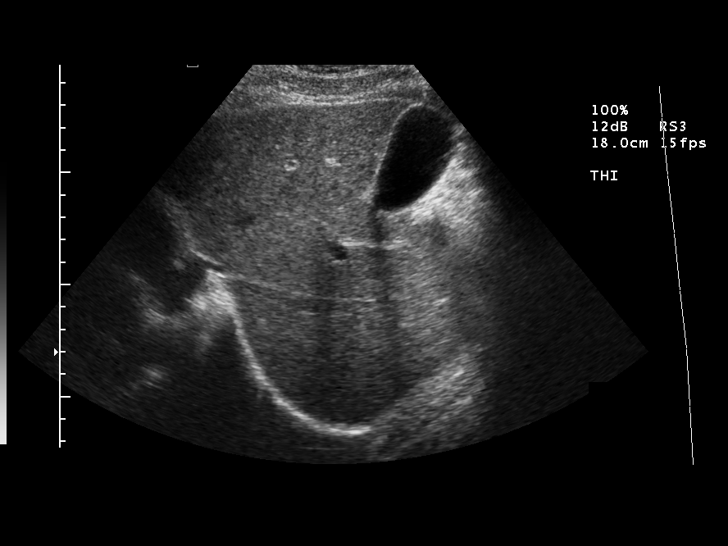
[im 20/59]
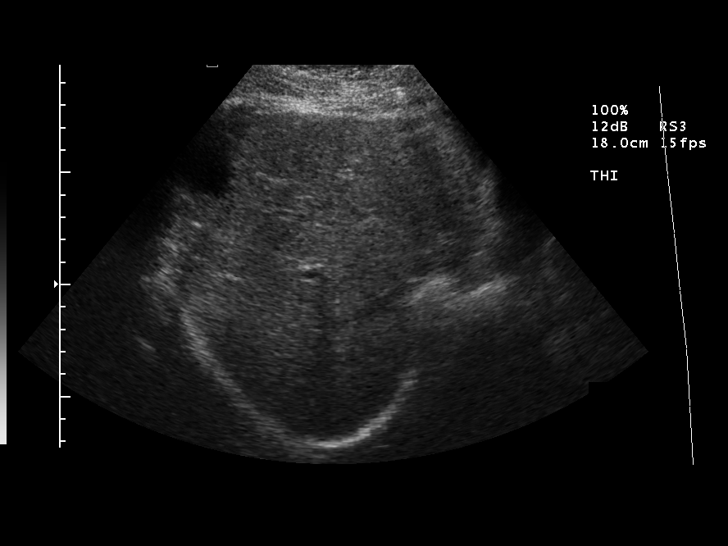
[im 22/59]
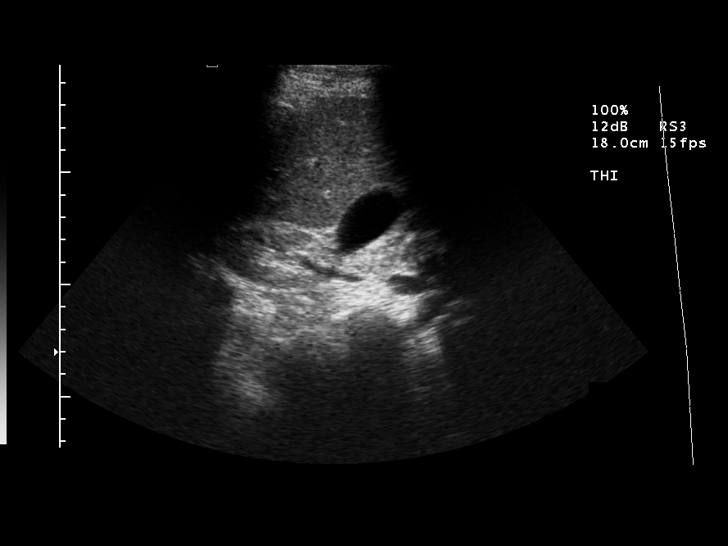
[im 27/59]
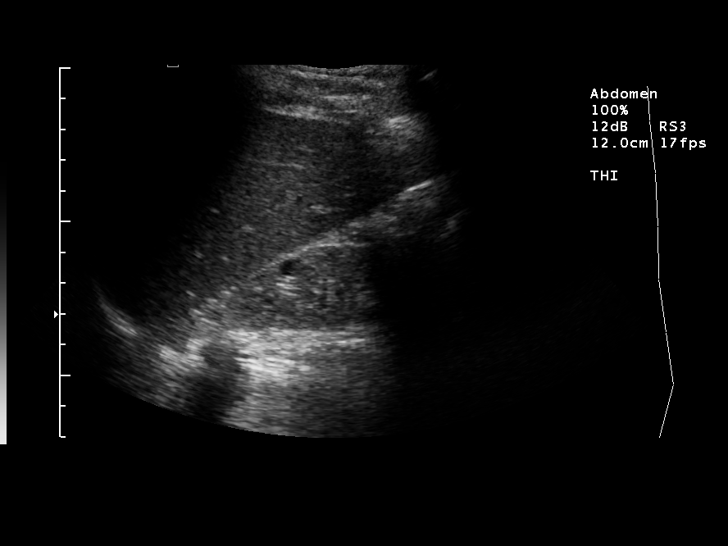
[im 32/59]
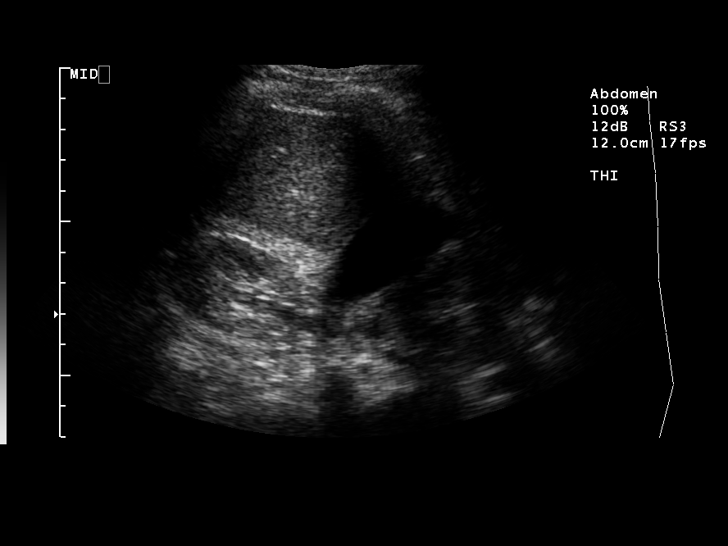
[im 37/59]
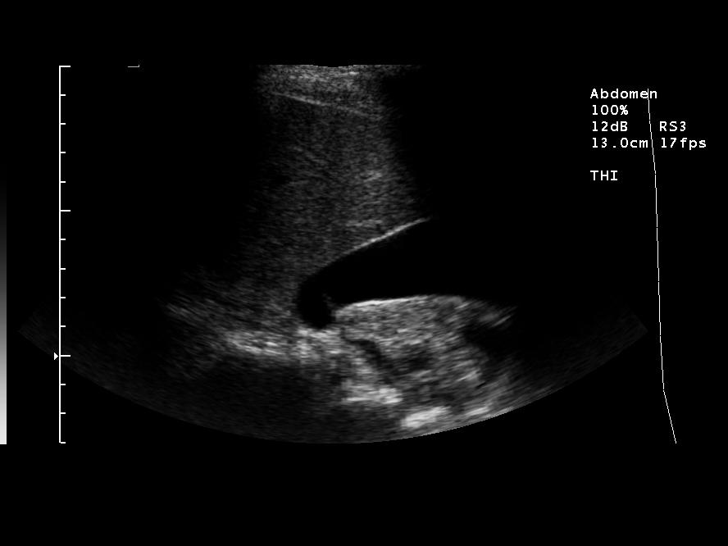
[im 39/59]
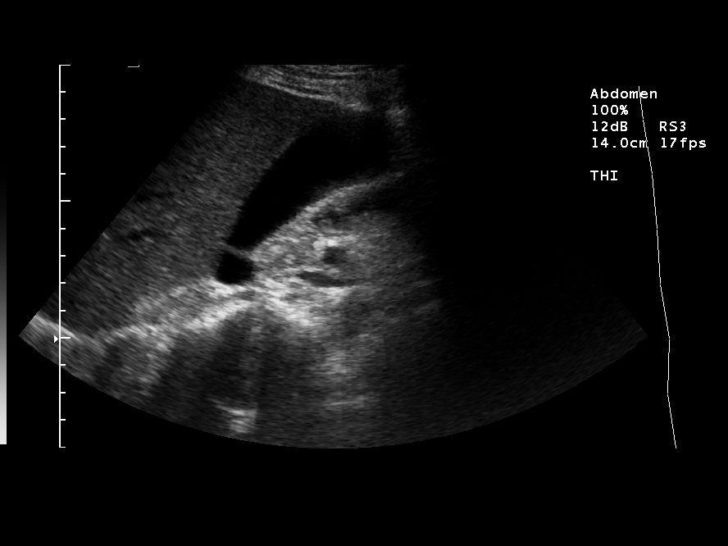
[im 44/59]
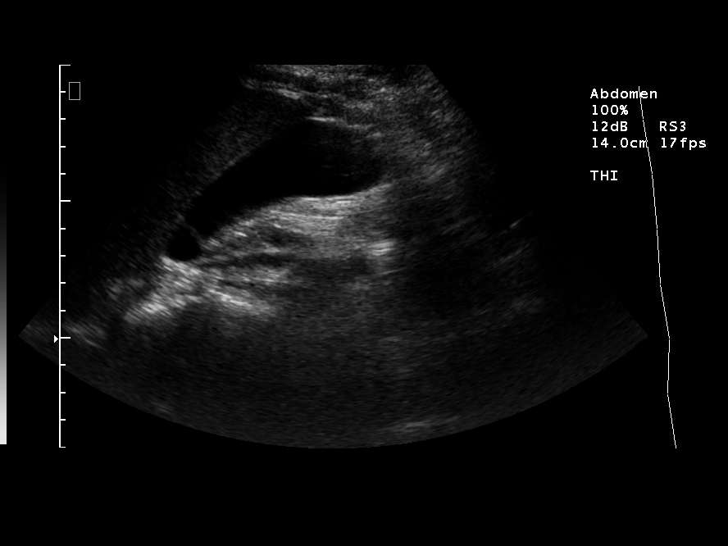
[im 49/59]
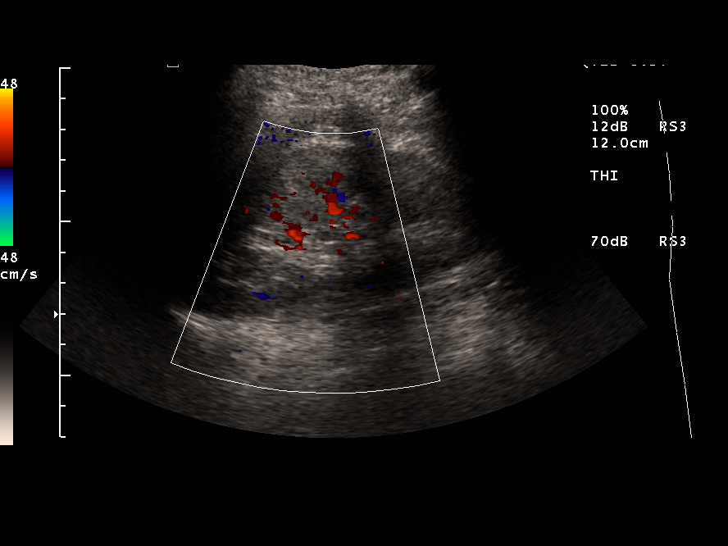
[im 54/59]
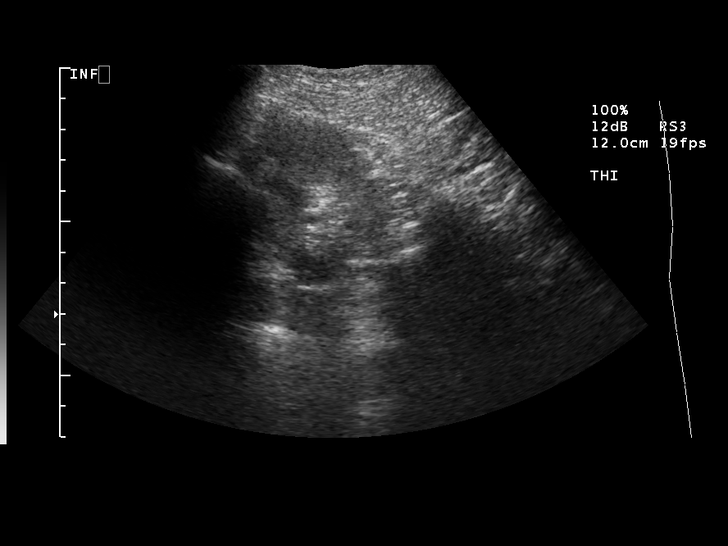
[im 59/59]
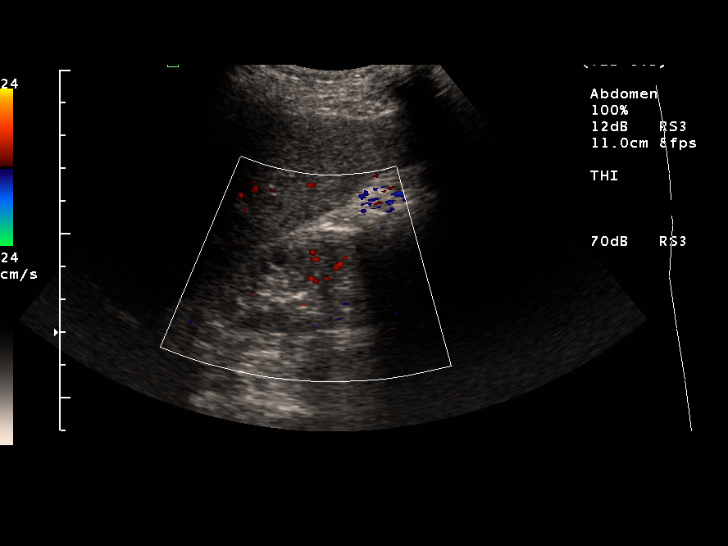

[14 of 25 positions shown; findings below may reference images not displayed]

FINDINGS: Scans over the upper abdomen were performed. The gallbladder is well seen and no gallstones are noted.  The liver has a normal echogenic pattern. The common bile duct however, is dilated measuring up to 8 mm in diameter.  Correlation with LFT?s is recommended.  This could indicate distal common bile duct calculus, stricture, or mass, and CT may be helpful to assess further.  IVC, pancreas and spleen appear normal. No hydronephrosis is noted. The right kidney measures 7.0 cm sagittally, and is echogenic with the left kidney measuring 10.9 cm.  This may indicate right renovascular disease. The abdominal aorta is normal in caliber.
IMPRESSION: 1.  No definite gallstones, but the common bile duct does measure 8 mm, suspicious for distal common bile duct obstruction. Correlate with LFT?s and consider CT of the abdomen.
 2.  Small echogenic right kidney suggestive of chronic right renovascular disease.

## 2008-03-27 ENCOUNTER — Encounter (INDEPENDENT_AMBULATORY_CARE_PROVIDER_SITE_OTHER): Payer: Self-pay | Admitting: Cardiology

## 2008-03-27 ENCOUNTER — Ambulatory Visit (HOSPITAL_COMMUNITY): Admission: RE | Admit: 2008-03-27 | Discharge: 2008-03-27 | Payer: Self-pay | Admitting: Cardiology

## 2008-03-27 ENCOUNTER — Ambulatory Visit: Payer: Self-pay | Admitting: Surgery

## 2008-07-15 IMAGING — CT CT HEAD W/O CM
1 series · 16 of 30 positions shown, 20 images · IV contrast (agent unspecified)
Comparison: None

CLINICAL DATA: Headache, right-sided.  
 HEAD CT WITHOUT CONTRAST:
TECHNIQUE: Contiguous axial images were obtained from the base of the skull through the vertex according to standard protocol without contrast.

[Series 2: head_seq 4.5 h37s st · axial · 0.43mm/px · z∈[-140,-14]mm · 16 of 32 slices shown, 20 images]
[im 2/32  brain]
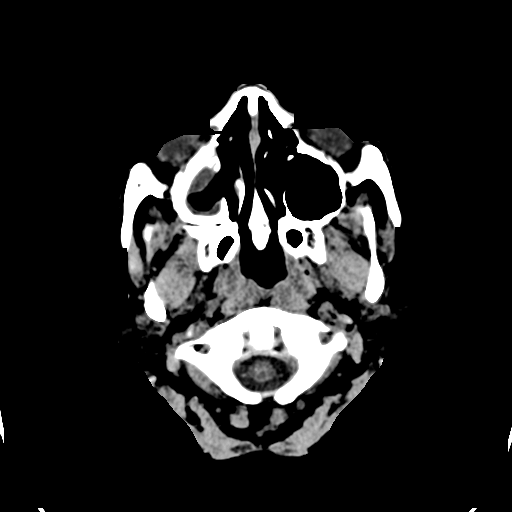
[im 2/32  bone]
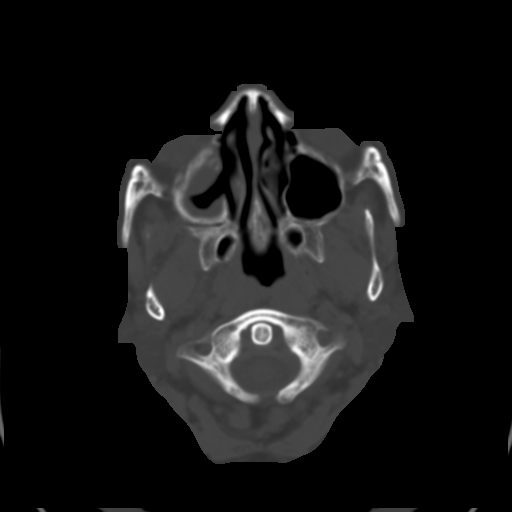
[im 4/32  brain]
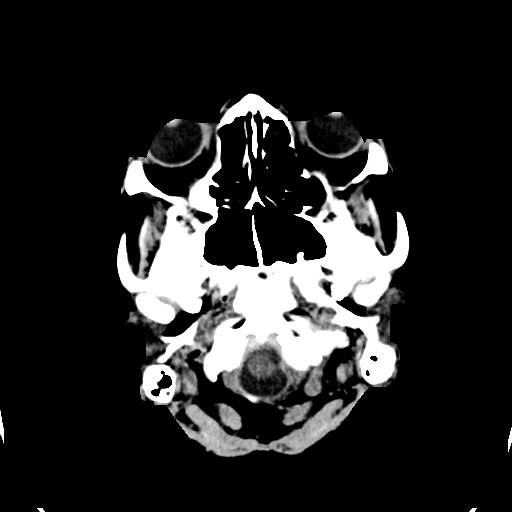
[im 6/32  brain]
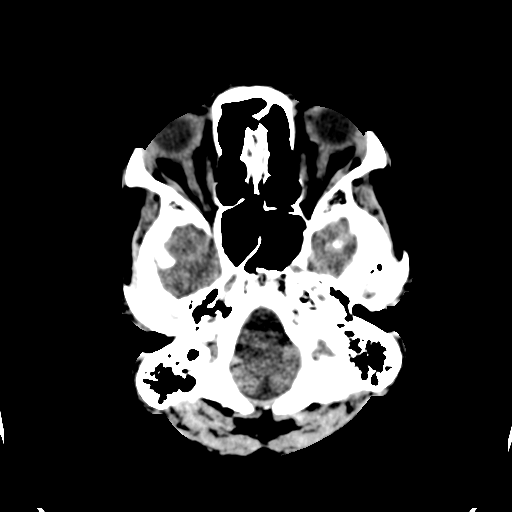
[im 8/32  brain]
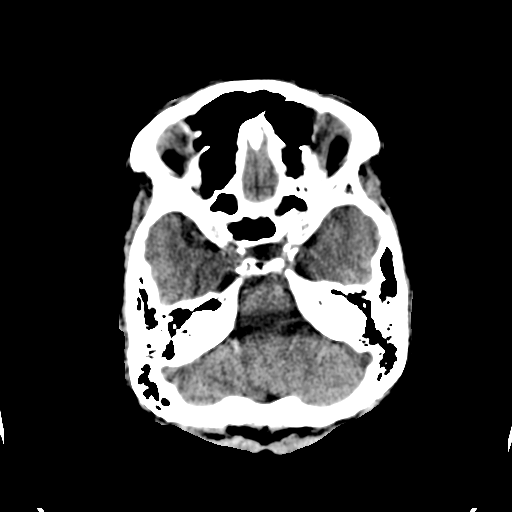
[im 9/32  brain]
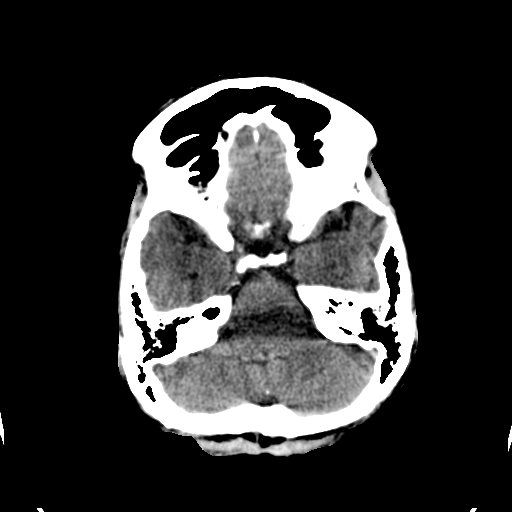
[im 9/32  bone]
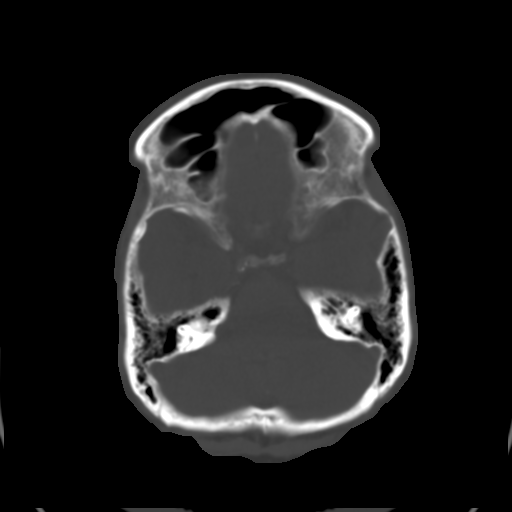
[im 11/32  brain]
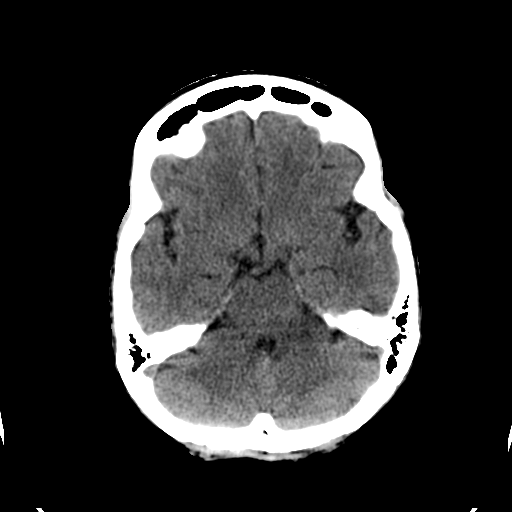
[im 13/32  brain]
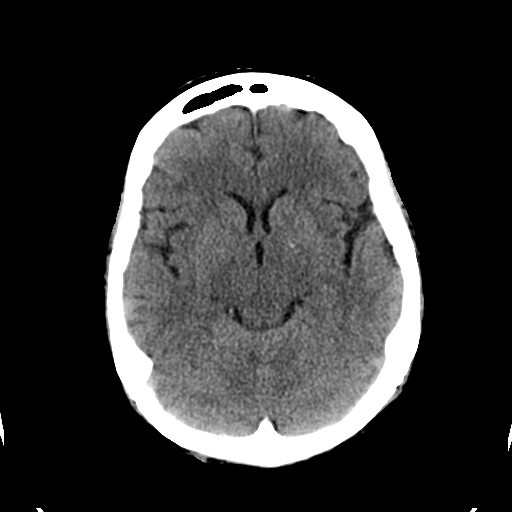
[im 15/32  brain]
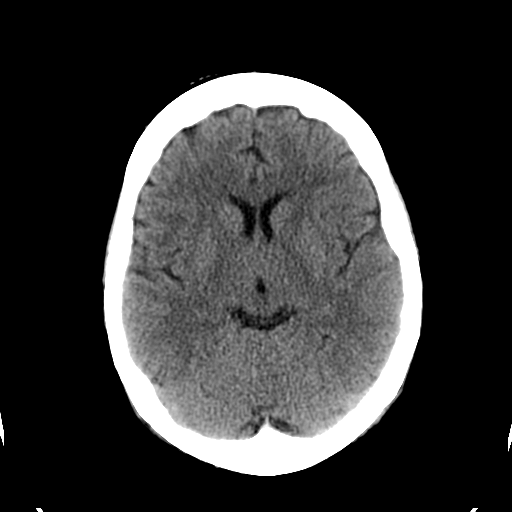
[im 17/32  brain]
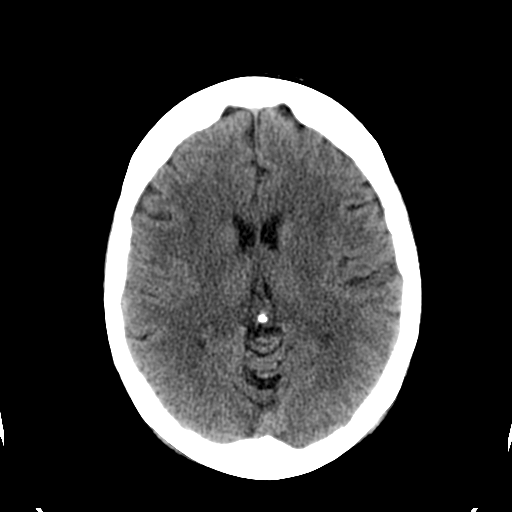
[im 17/32  bone]
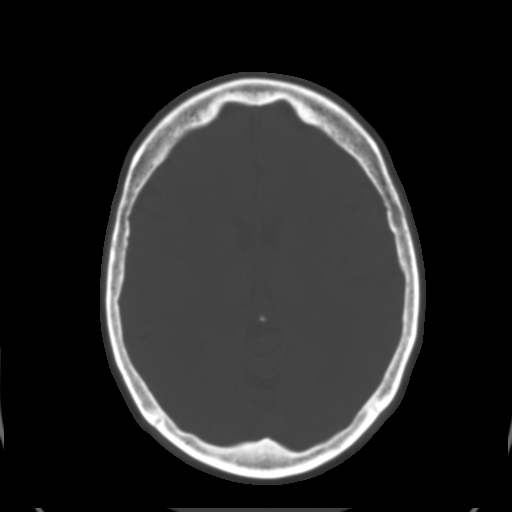
[im 19/32  brain]
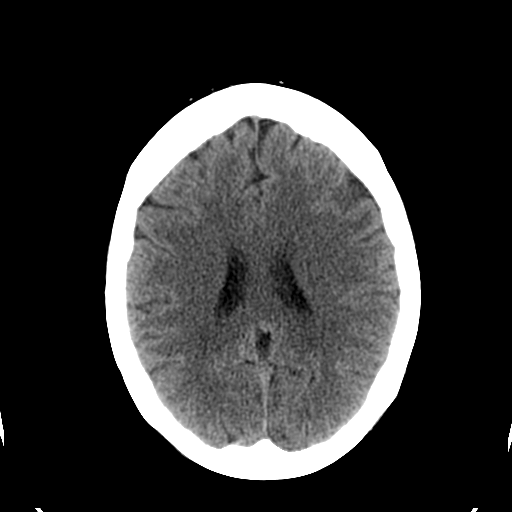
[im 21/32  brain]
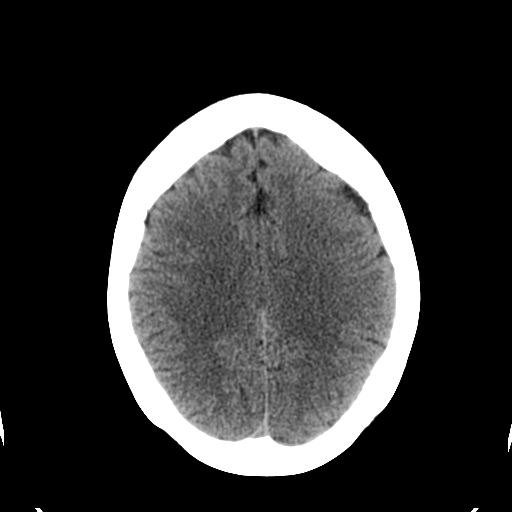
[im 23/32  brain]
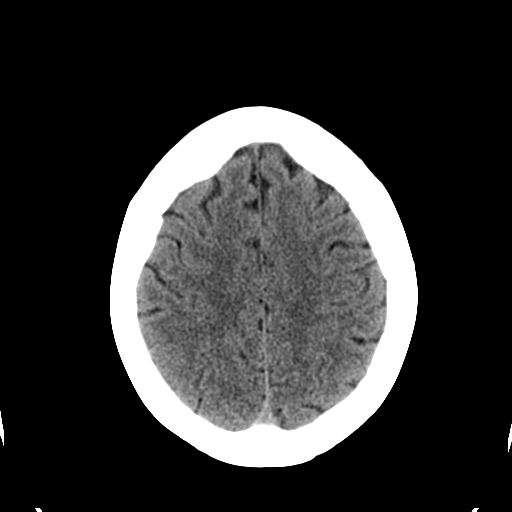
[im 24/32  brain]
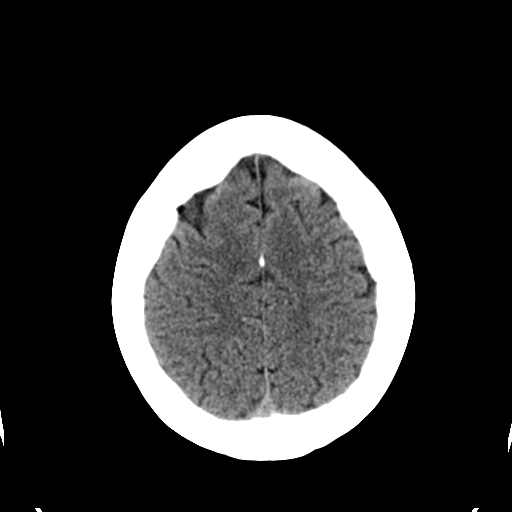
[im 24/32  bone]
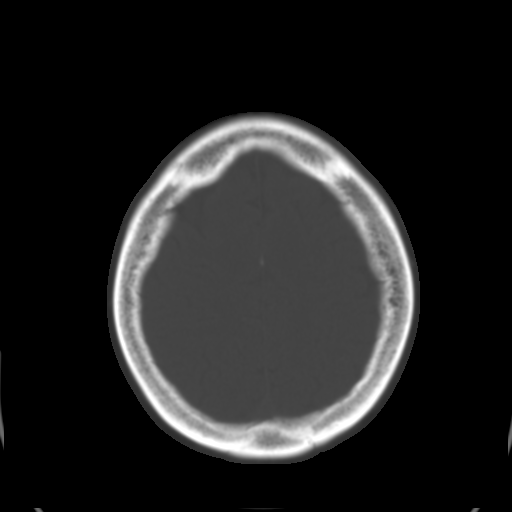
[im 26/32  brain]
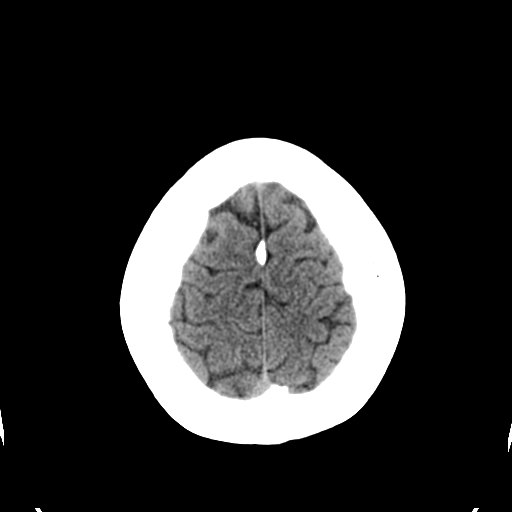
[im 28/32  brain]
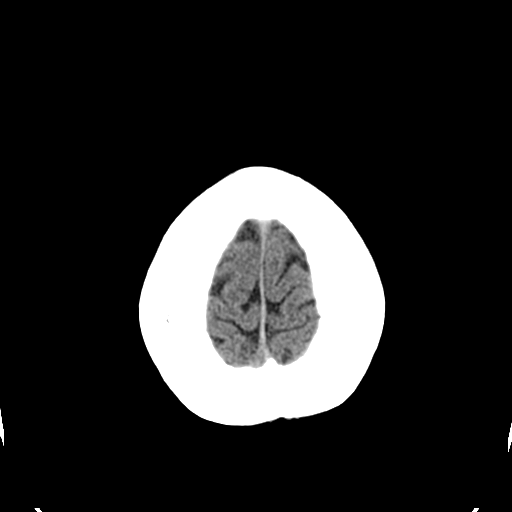
[im 30/32  brain]
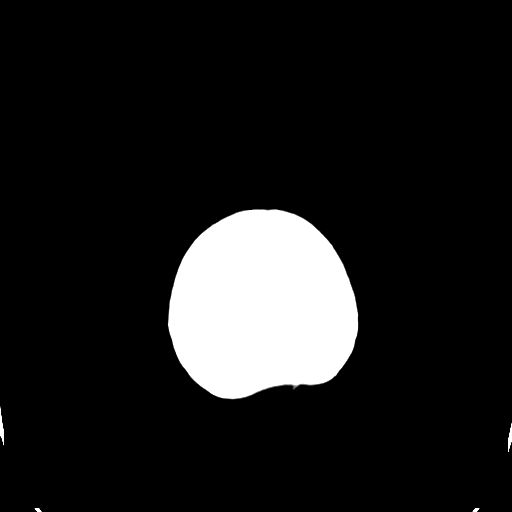

[16 of 30 positions shown; findings below may reference images not displayed]

FINDINGS: The ventricles are normal.  There is no hemorrhage, mass or infarct.  
 Chronic sinusitis on the right with mucosal thickening and post-op changes.  There is bone thickening due to chronic sinusitis.  Remaining sinuses are clear.
IMPRESSION: No acute intracranial abnormality.

 .

## 2008-07-25 ENCOUNTER — Emergency Department (HOSPITAL_COMMUNITY): Admission: EM | Admit: 2008-07-25 | Discharge: 2008-07-25 | Payer: Self-pay | Admitting: Emergency Medicine

## 2008-10-09 IMAGING — CR DG HIP COMPLETE 2+V*R*
4 series · 4 of 4 positions shown · non-contrast
Comparison: 01/11/06.

CLINICAL DATA: 66-year-old female with fall today.  Right hip and knee pain.  Hip replacement 18 months ago.
 RIGHT HIP/PELVIS ? 4 VIEW:

[view not recorded (1 of 4)]
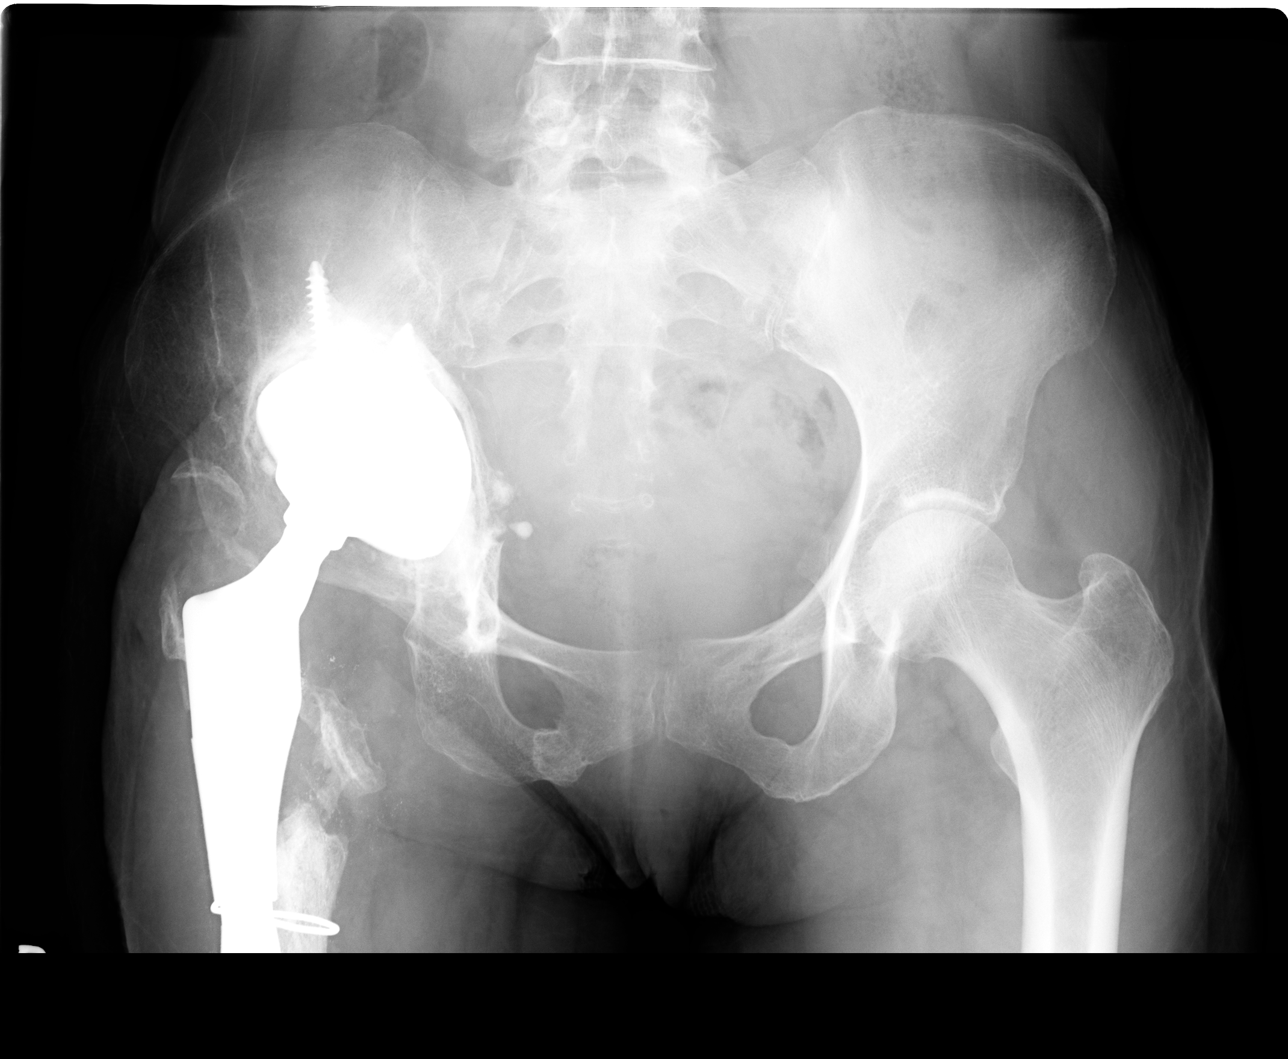

[view not recorded (2 of 4)]
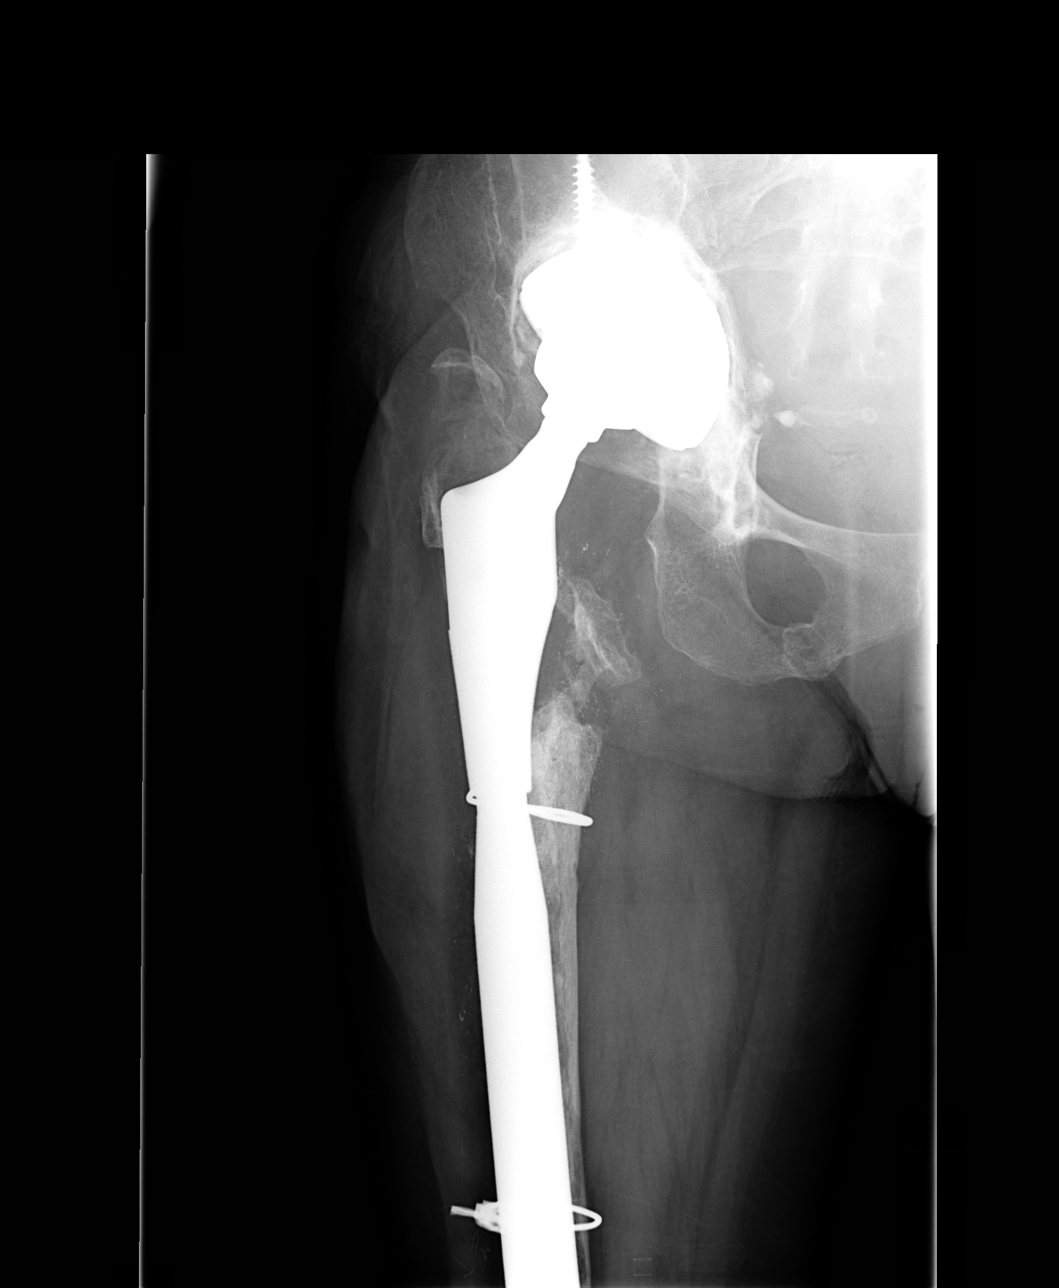

[view not recorded (3 of 4)]
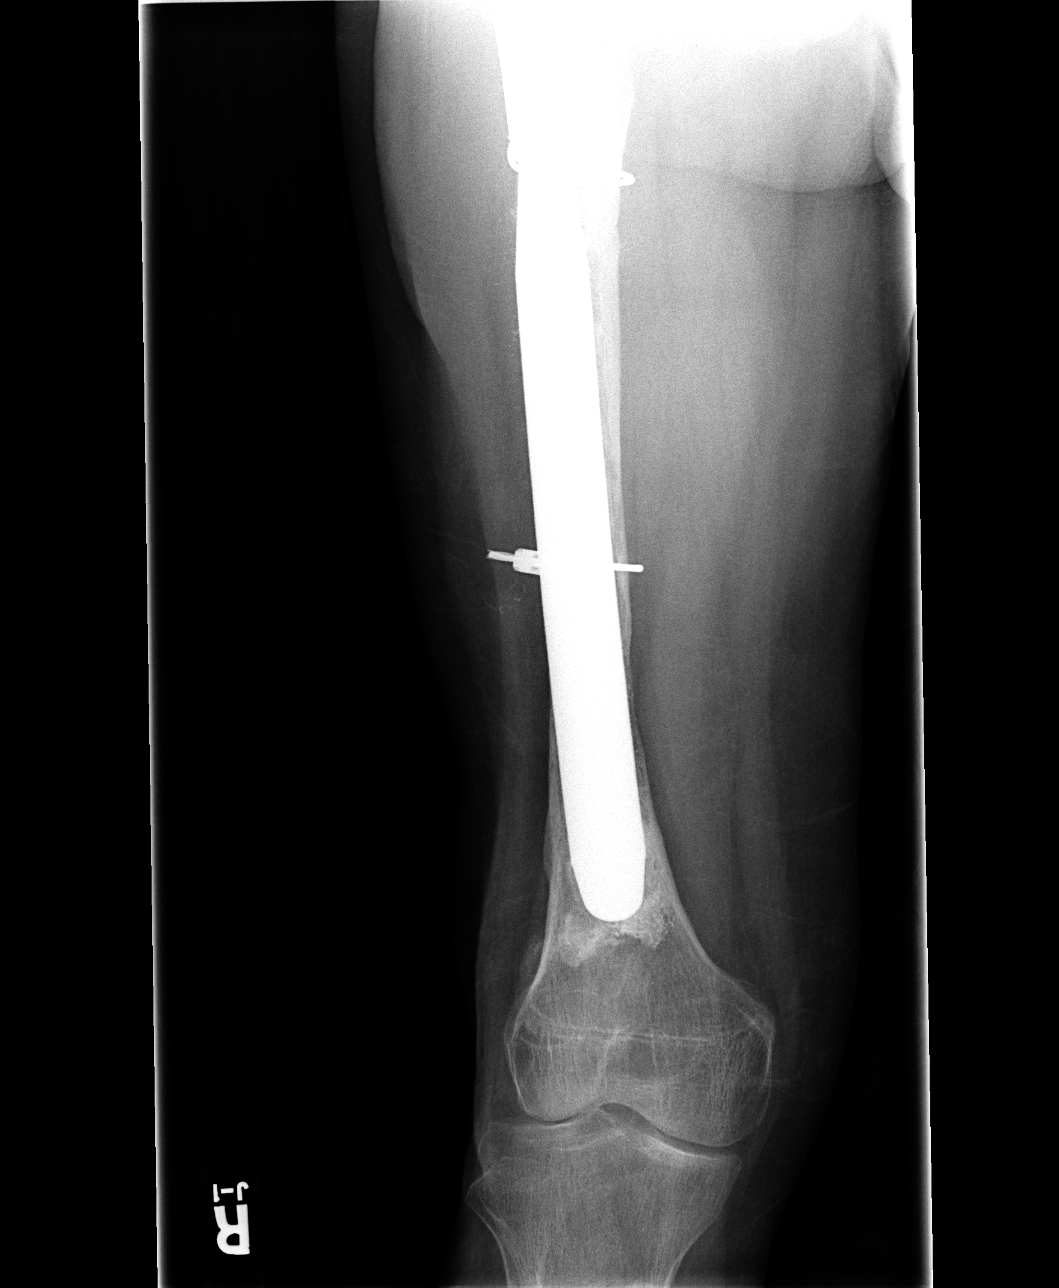

[view not recorded (4 of 4)]
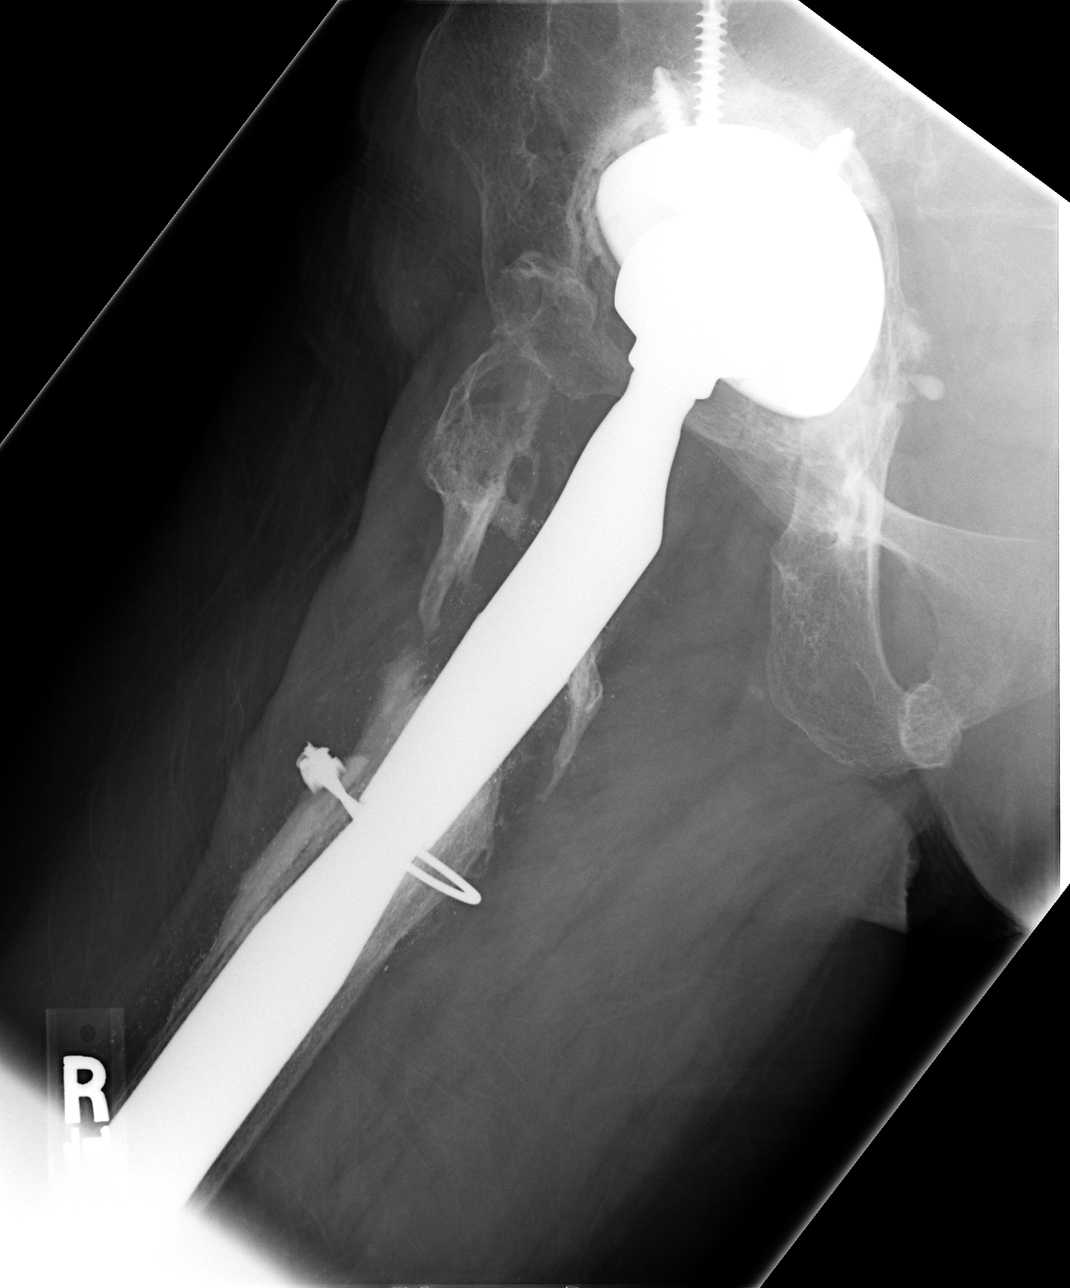

[4 of 4 positions shown; findings below may reference images not displayed]

FINDINGS: Stable appearance of the right acetabular component of the right total hip arthroplasty.  There has been interval revision of the femoral component.  Large areas of heterotopic ossification about the greater trochanter and inferior trochanter are not significantly changed from the prior exam.  There is a chronic fracture of the right inferior pubic ramus and an area of extreme osteopenia.  The right femoral component now extends almost to the distal femoral condyles.  Inferior pubic ramus is stable.  There is a healed deformity of the inferior right pubic ramus with callous formation.  The lucency adjacent to the hardware and the distal femoral medullary canal seen on the prior exam has been resolved and some bone cement is now seen in this region.  There is lateral greater than medial right knee compartment joint space narrowing evident.
IMPRESSION: 1. No definite acute fracture or dislocation about the right hip or femur. 
 2. Interval revision of the right femoral arthroplasty with resolution of peri-hardware lucency.

## 2008-10-09 IMAGING — CR DG KNEE COMPLETE 4+V*R*
4 series · 4 of 4 positions shown · non-contrast
Comparison: none

CLINICAL DATA: Fall.  Knee pain.
 RIGHT KNEE ? 4 VIEW:

[view not recorded (1 of 4)]
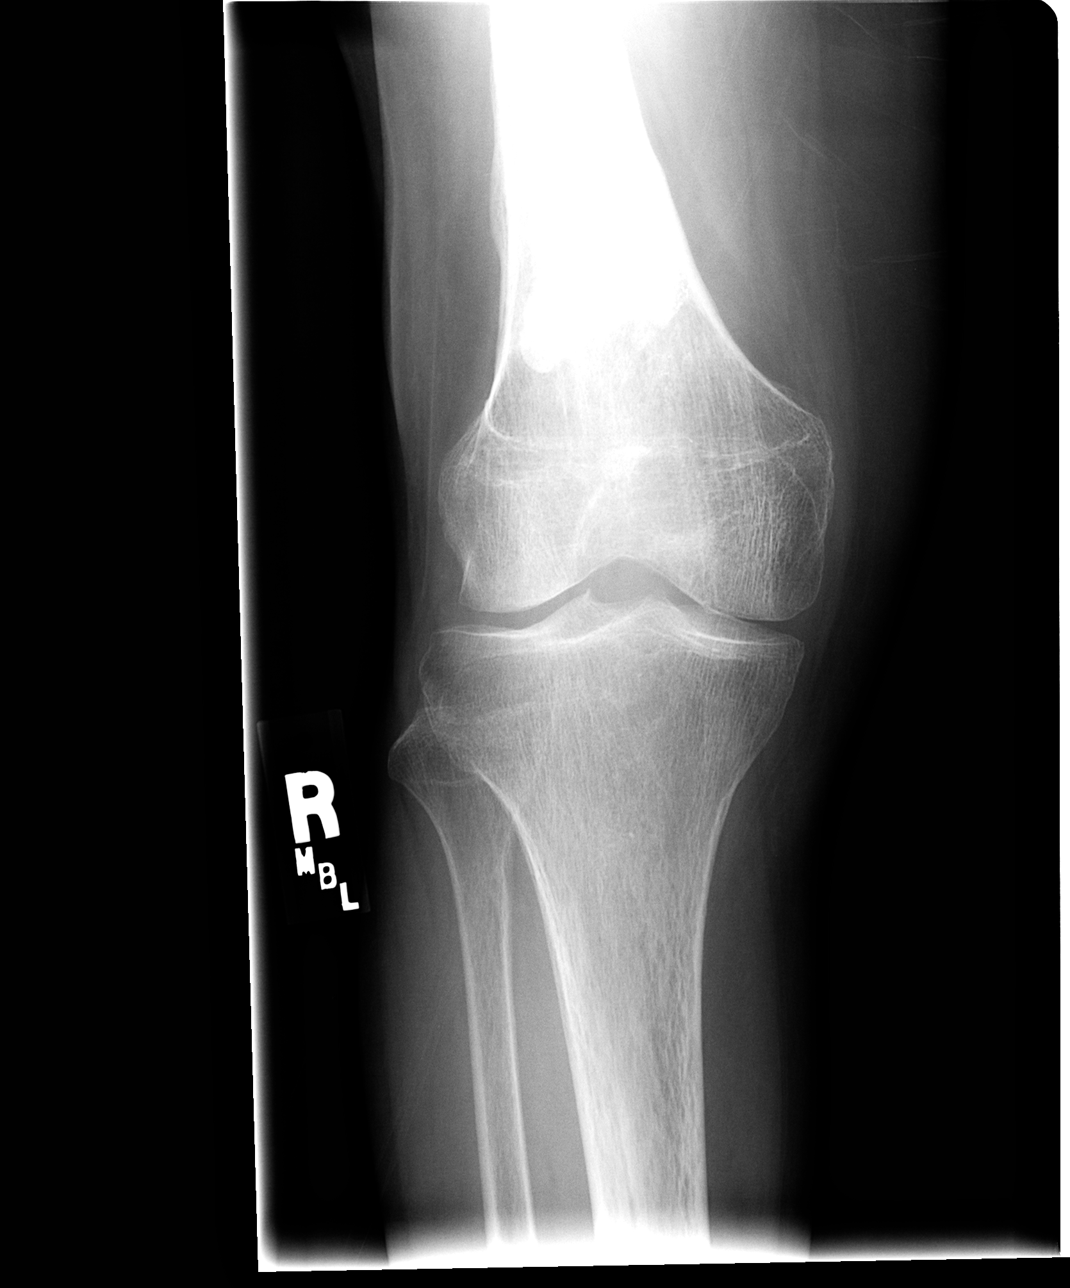

[view not recorded (2 of 4)]
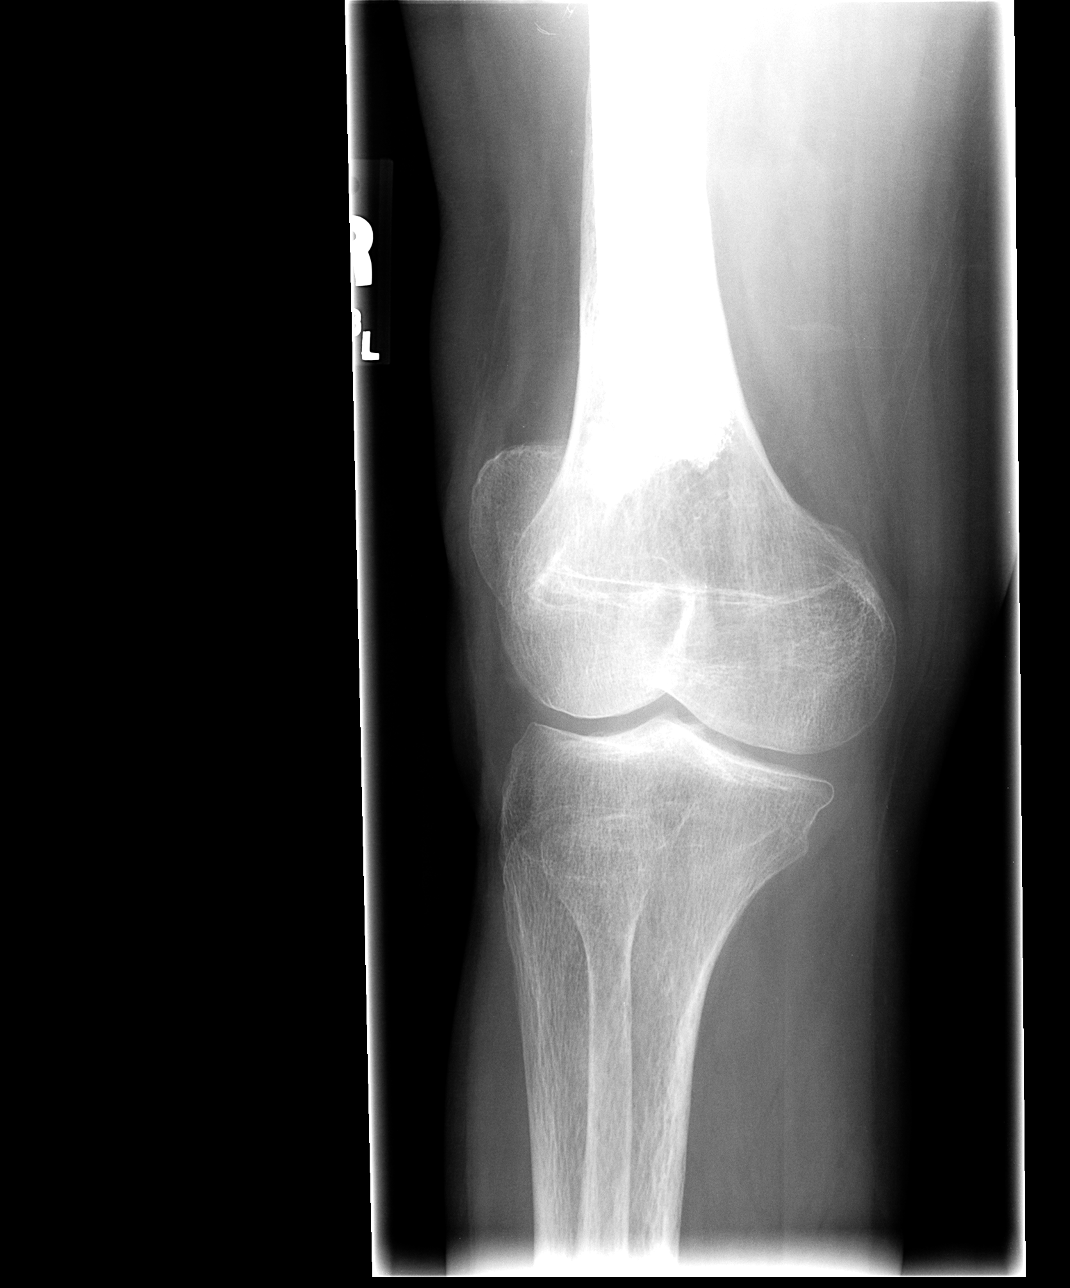

[view not recorded (3 of 4)]
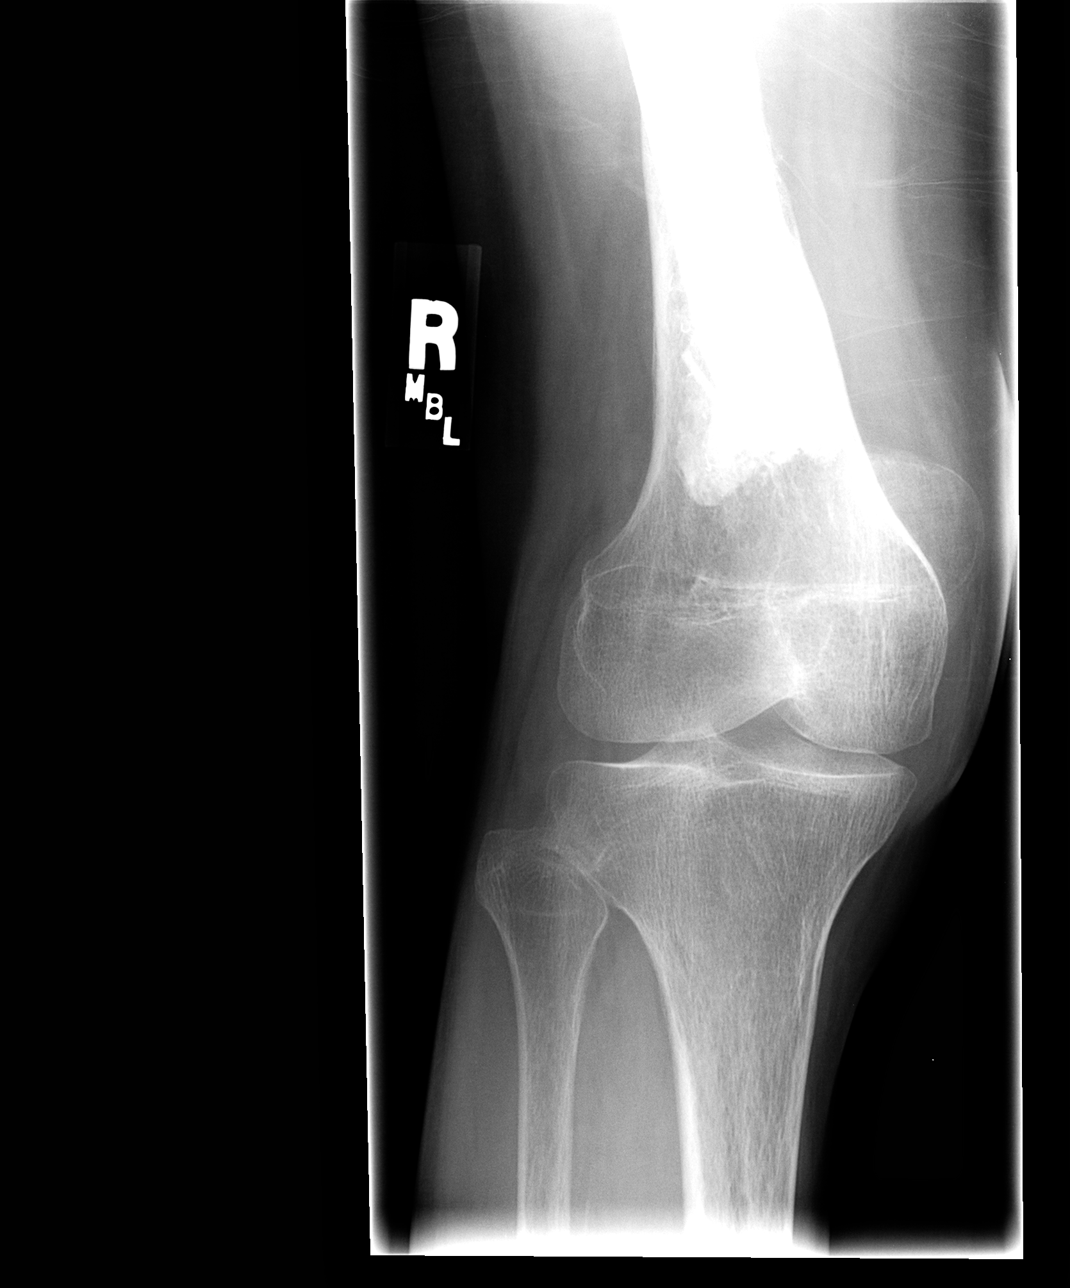

[view not recorded (4 of 4)]
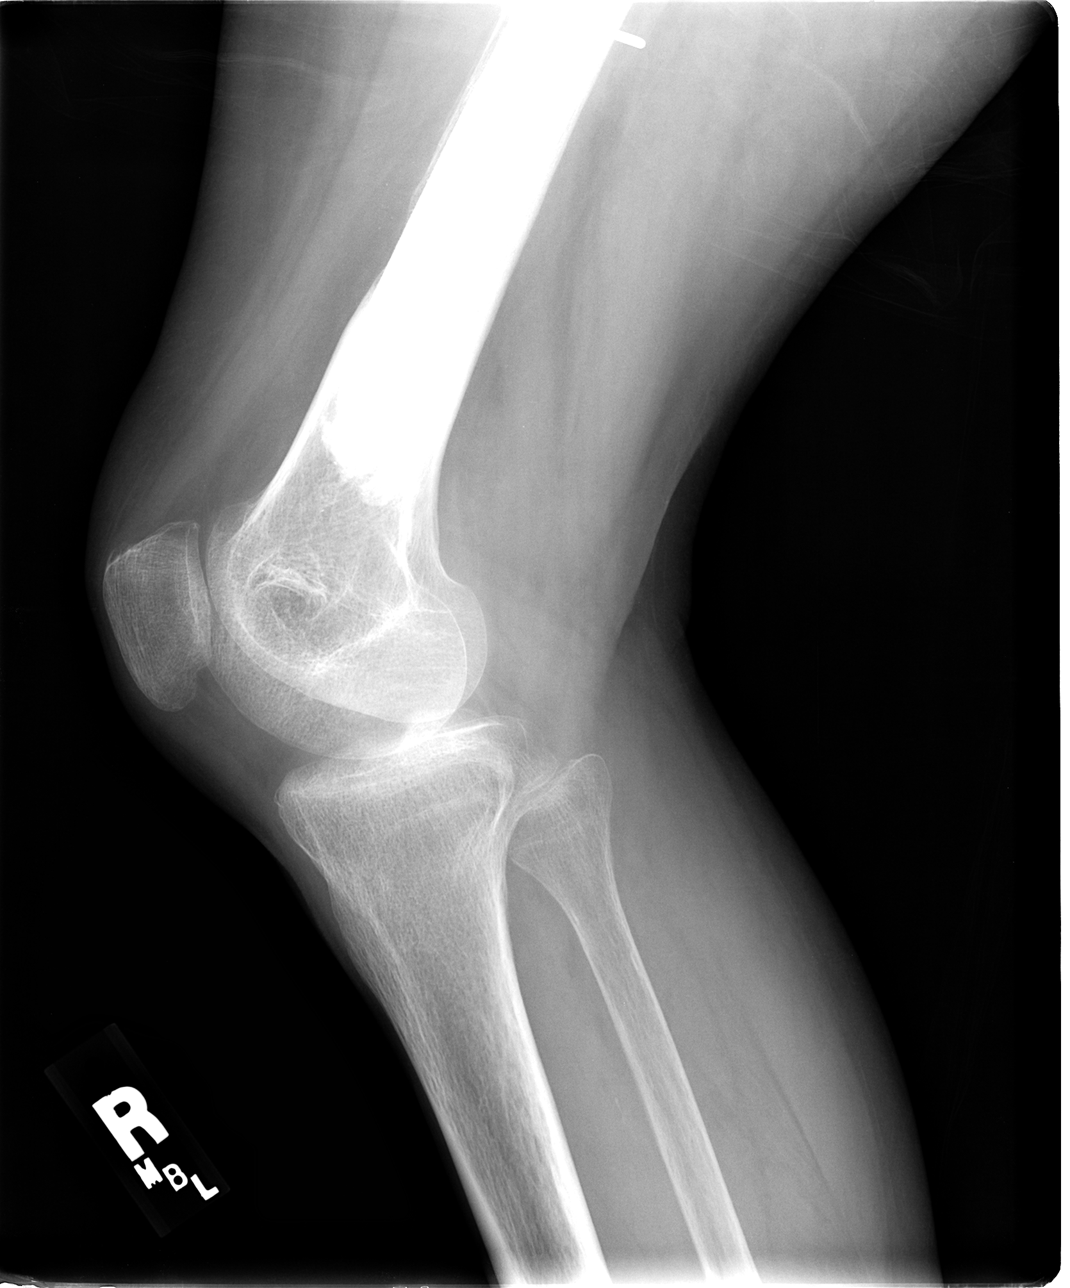

[4 of 4 positions shown; findings below may reference images not displayed]

FINDINGS: The bones are osteopenic.  No evidence of fracture in the region of the knee.
IMPRESSION: See above report.

## 2008-10-14 ENCOUNTER — Emergency Department (HOSPITAL_COMMUNITY): Admission: EM | Admit: 2008-10-14 | Discharge: 2008-10-14 | Payer: Self-pay | Admitting: Emergency Medicine

## 2008-11-18 ENCOUNTER — Emergency Department (HOSPITAL_COMMUNITY): Admission: EM | Admit: 2008-11-18 | Discharge: 2008-11-18 | Payer: Self-pay | Admitting: Emergency Medicine

## 2008-12-19 ENCOUNTER — Encounter (INDEPENDENT_AMBULATORY_CARE_PROVIDER_SITE_OTHER): Payer: Self-pay | Admitting: Obstetrics and Gynecology

## 2008-12-19 ENCOUNTER — Ambulatory Visit (HOSPITAL_COMMUNITY): Admission: RE | Admit: 2008-12-19 | Discharge: 2008-12-19 | Payer: Self-pay | Admitting: Obstetrics and Gynecology

## 2009-01-15 ENCOUNTER — Emergency Department (HOSPITAL_COMMUNITY): Admission: EM | Admit: 2009-01-15 | Discharge: 2009-01-16 | Payer: Self-pay | Admitting: Emergency Medicine

## 2009-01-28 IMAGING — CR DG CHEST 2V
2 series · 2 of 2 positions shown · non-contrast
Comparison: [REDACTED] chest x-ray 07/12/06.

CLINICAL DATA: Fatigue, hypertension, smoker. 
 CHEST, TWO VIEWS:

[w chest pa]
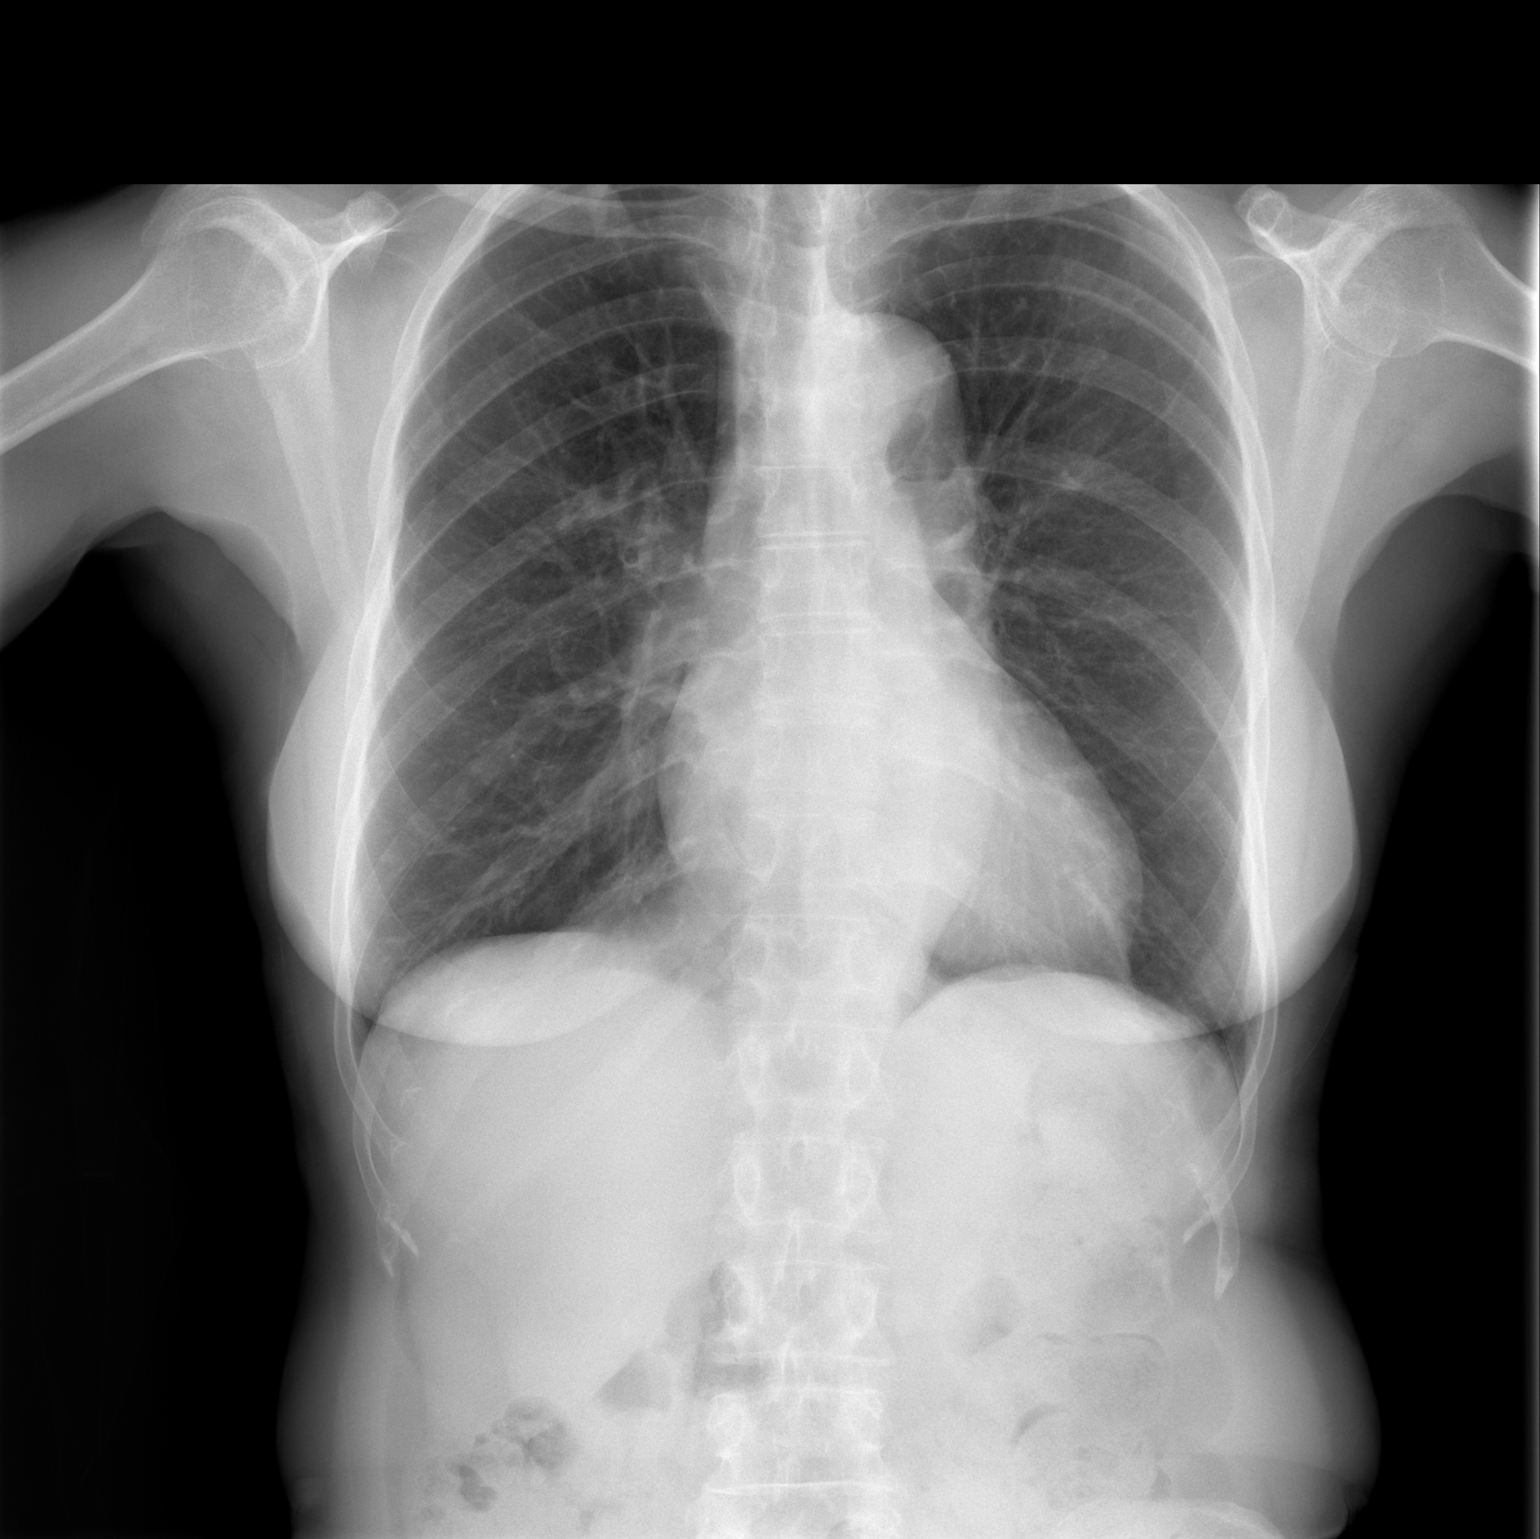

[w chest lat]
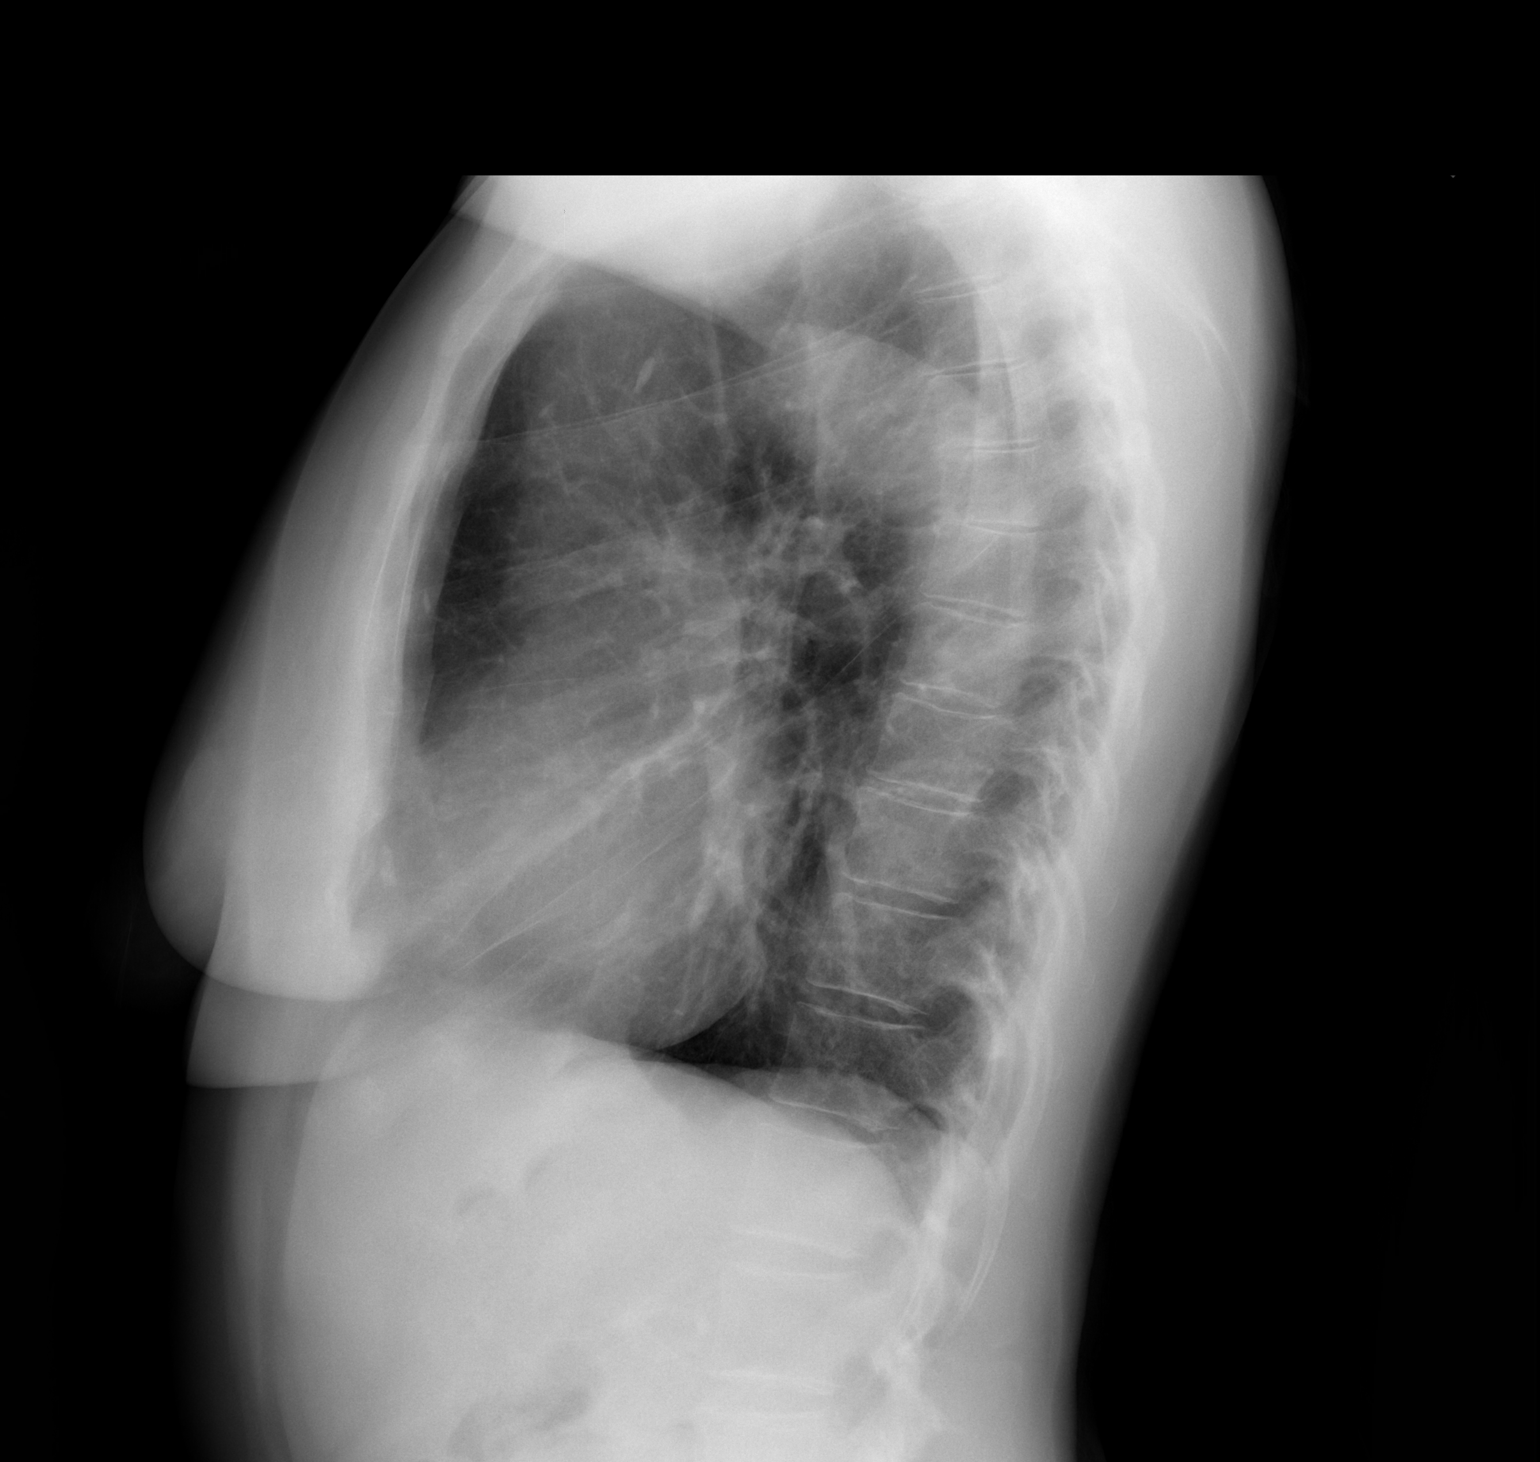

[2 of 2 positions shown; findings below may reference images not displayed]

FINDINGS: Stable slight COPD changes are seen with lungs otherwise clear.  Heart size is upper limits of normal.  Mediastinum, hila, pleura, and osseous structures are stable and unremarkable.
IMPRESSION: 1.  Stable slight COPD. 
 2.  No active disease.

## 2009-02-14 ENCOUNTER — Emergency Department (HOSPITAL_COMMUNITY): Admission: EM | Admit: 2009-02-14 | Discharge: 2009-02-14 | Payer: Self-pay | Admitting: Emergency Medicine

## 2009-02-21 ENCOUNTER — Emergency Department (HOSPITAL_COMMUNITY): Admission: EM | Admit: 2009-02-21 | Discharge: 2009-02-21 | Payer: Self-pay | Admitting: Emergency Medicine

## 2009-02-21 ENCOUNTER — Emergency Department (HOSPITAL_COMMUNITY): Admission: EM | Admit: 2009-02-21 | Discharge: 2009-02-21 | Payer: Self-pay | Admitting: Family Medicine

## 2009-02-26 ENCOUNTER — Inpatient Hospital Stay (HOSPITAL_COMMUNITY): Admission: RE | Admit: 2009-02-26 | Discharge: 2009-02-28 | Payer: Self-pay | Admitting: *Deleted

## 2009-02-26 ENCOUNTER — Ambulatory Visit: Payer: Self-pay | Admitting: *Deleted

## 2009-07-08 ENCOUNTER — Ambulatory Visit: Payer: Self-pay | Admitting: Surgery

## 2009-07-08 ENCOUNTER — Encounter (INDEPENDENT_AMBULATORY_CARE_PROVIDER_SITE_OTHER): Payer: Self-pay | Admitting: Cardiology

## 2009-07-08 ENCOUNTER — Ambulatory Visit (HOSPITAL_COMMUNITY): Admission: RE | Admit: 2009-07-08 | Discharge: 2009-07-08 | Payer: Self-pay | Admitting: Cardiology

## 2009-08-27 ENCOUNTER — Emergency Department (HOSPITAL_COMMUNITY): Admission: EM | Admit: 2009-08-27 | Discharge: 2009-08-27 | Payer: Self-pay | Admitting: Emergency Medicine

## 2009-10-29 ENCOUNTER — Ambulatory Visit: Payer: Self-pay | Admitting: Vascular Surgery

## 2009-11-22 IMAGING — CR DG LUMBAR SPINE COMPLETE 4+V
5 series · 5 of 5 positions shown · non-contrast
Comparison: 05/06/2006 abdominal film.

CLINICAL DATA: Fall with low back pain.

LUMBAR SPINE - COMPLETE 4+ VIEW

[view not recorded (1 of 5)]
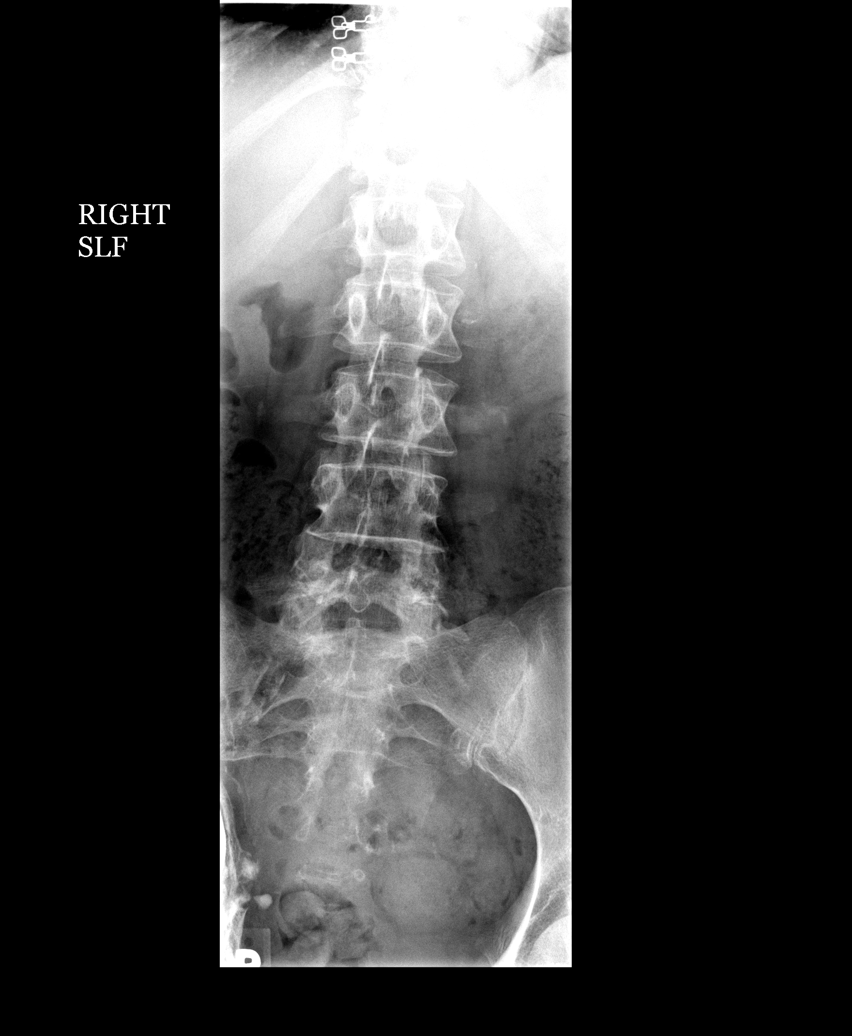

[view not recorded (2 of 5)]
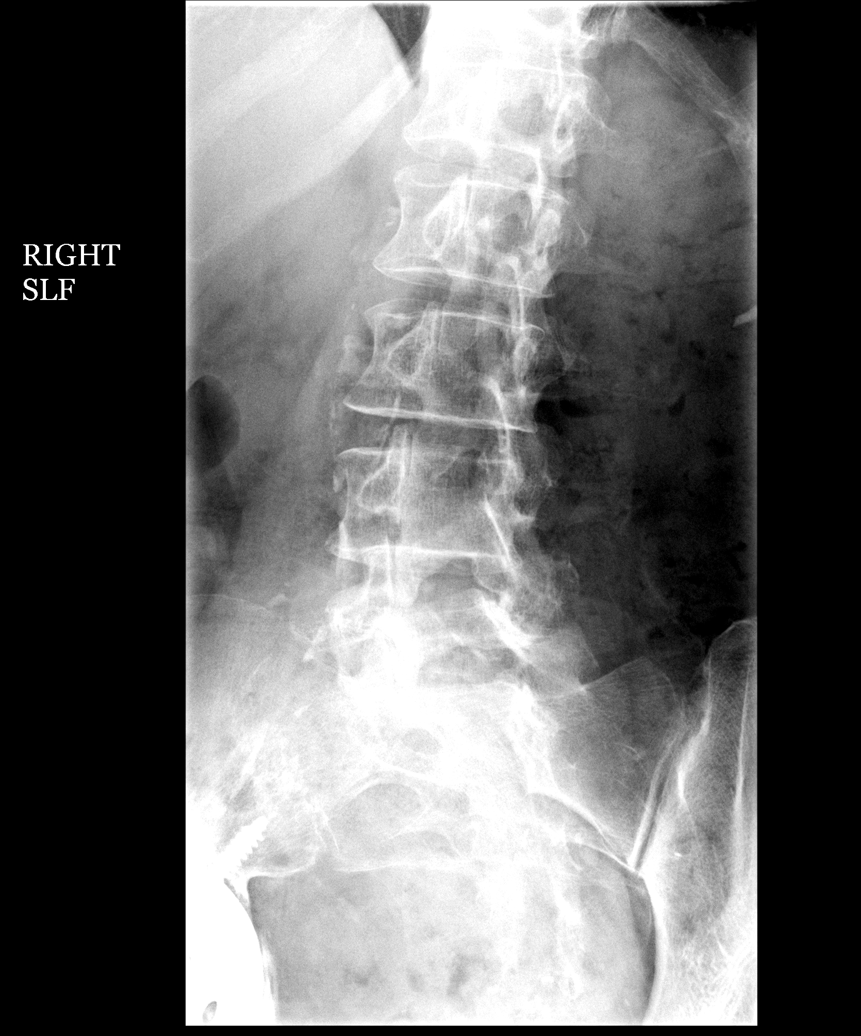

[view not recorded (3 of 5)]
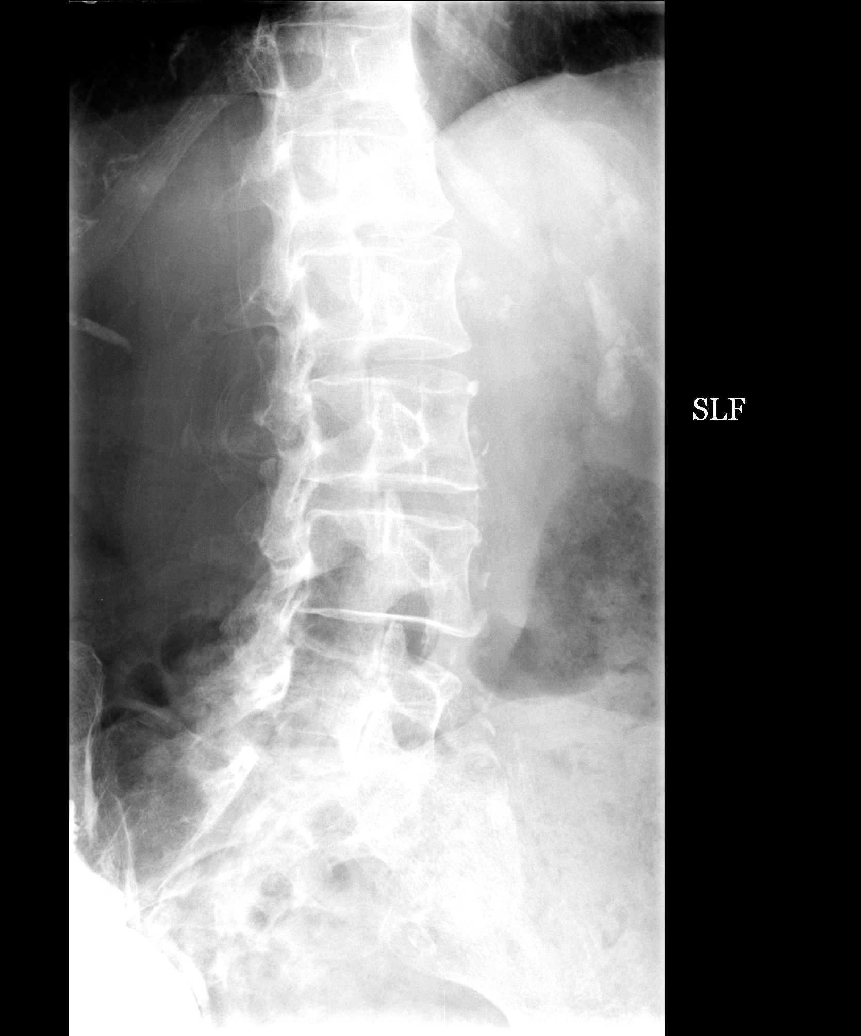

[view not recorded (4 of 5)]
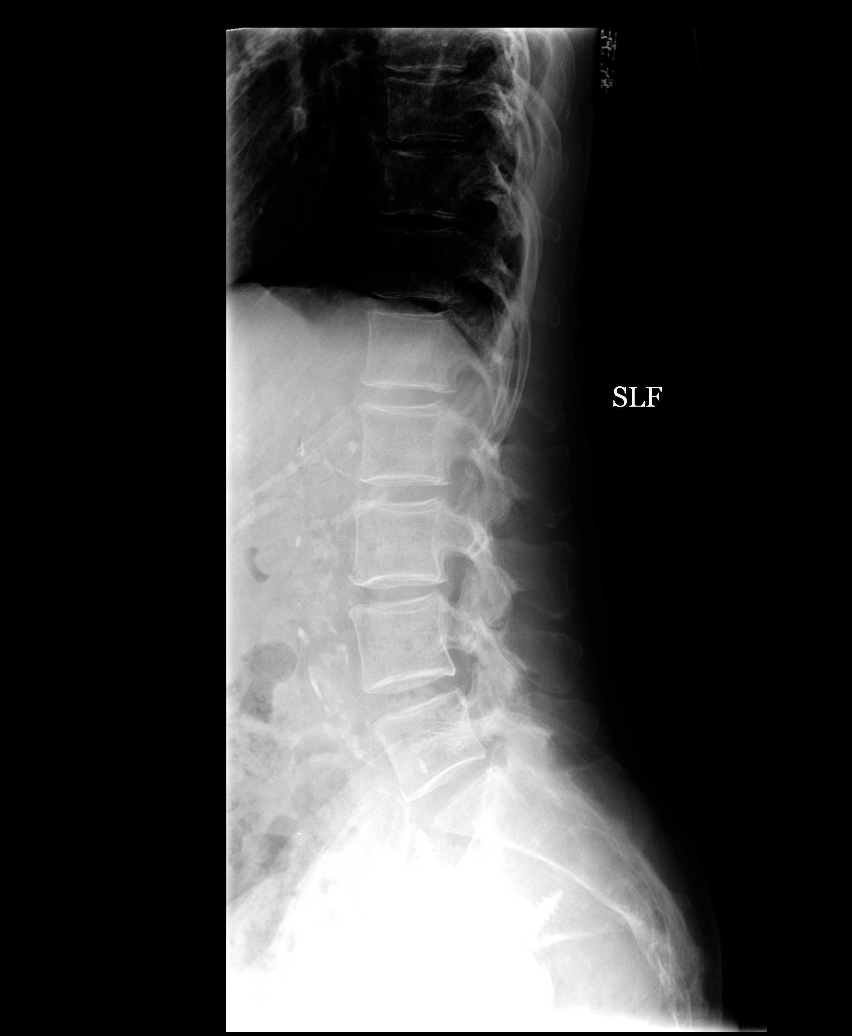

[view not recorded (5 of 5)]
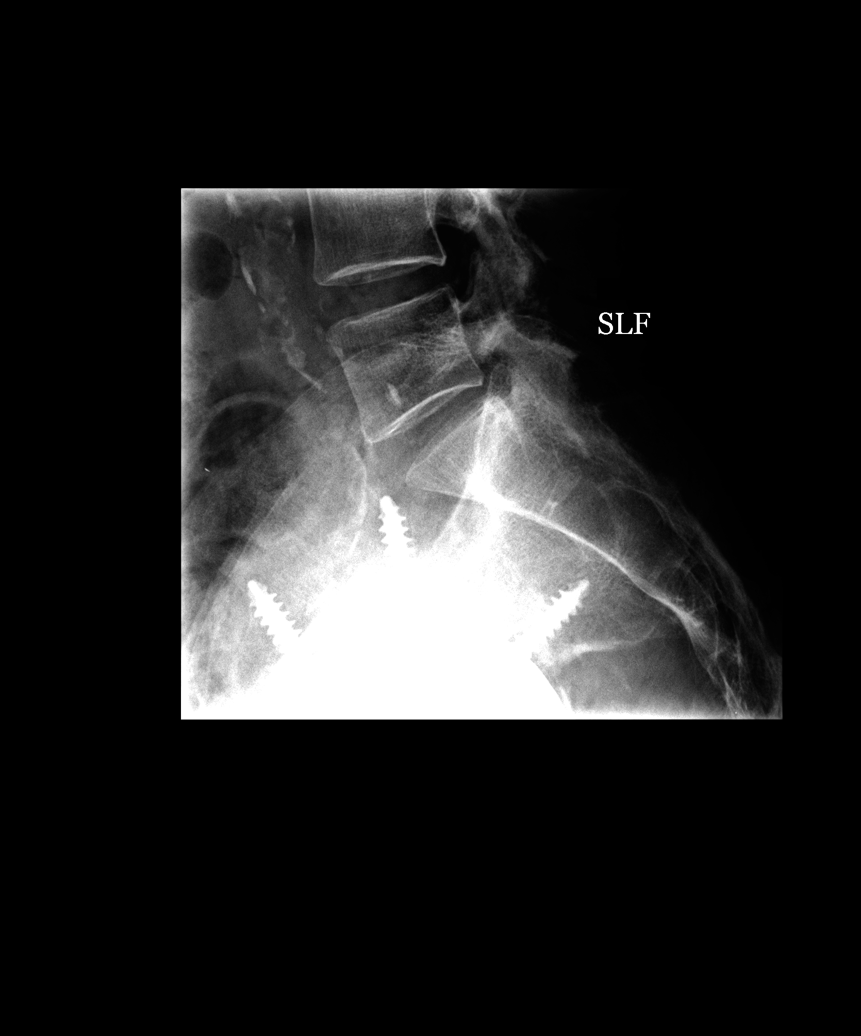

[5 of 5 positions shown; findings below may reference images not displayed]

FINDINGS: Five non-rib bearing lumbar type vertebra are again
noted.
Normal alignment is noted without evidence of acute fracture,
subluxation, or dislocation.
The disc spaces are well maintained.
Very mild multilevel degenerative disc disease is noted.
A right total hip replacement is noted.
IMPRESSION: No evidence of acute abnormality.

Very mild multilevel degenerative disc disease.

## 2009-12-27 IMAGING — CT CT ABDOMEN W/O CM
1 of 2 series · 13 of 32 positions shown, 18 images · non-contrast
Comparison: Abdominal ultrasound 12/19/2006.

CT ABDOMEN

CLINICAL DATA: 67-year-old female with right flank pain and groin
pain.

CT ABDOMEN AND PELVIS WITHOUT CONTRAST
TECHNIQUE: Multidetector CT imaging of the abdomen and pelvis was
performed following the standard protocol without intravenous
contrast.

[Series 4: 160 stone 4.0 b70f st · axial · 0.57mm/px · z∈[+952,+1292]mm · 13 of 80 slices shown, 18 images]
[im 6/80  soft-tissue]
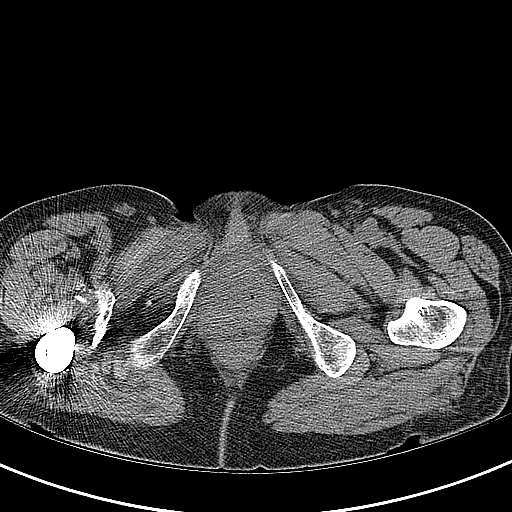
[im 6/80  bone]
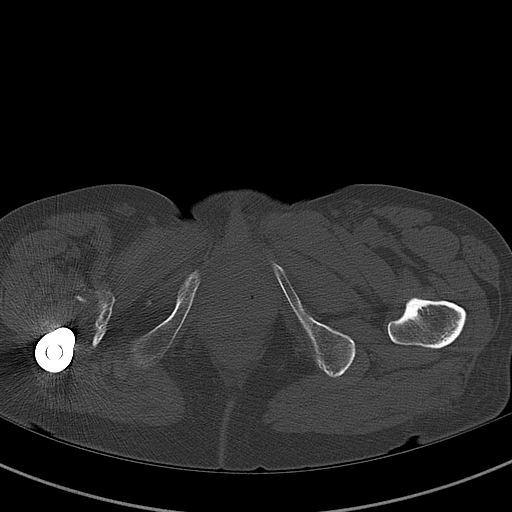
[im 11/80  soft-tissue]
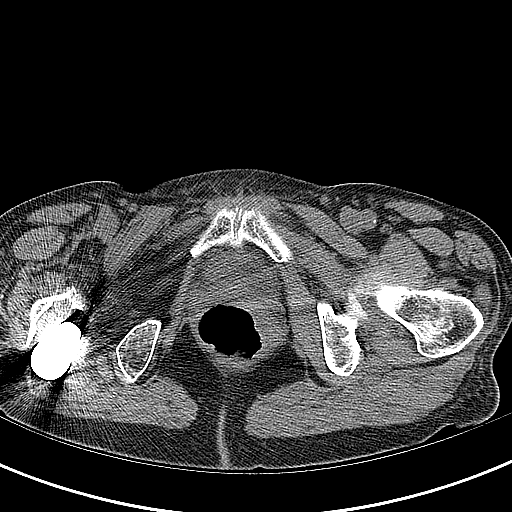
[im 16/80  soft-tissue]
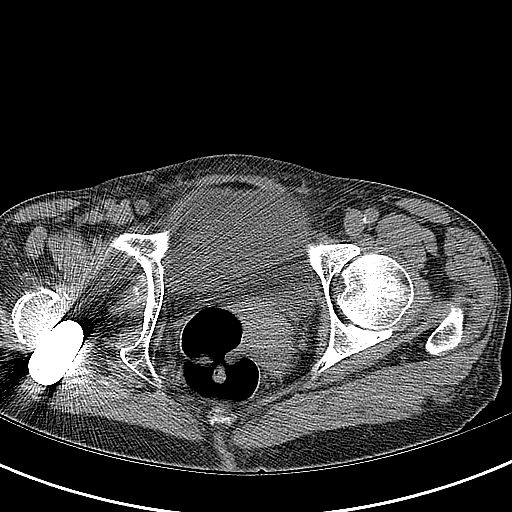
[im 27/80  soft-tissue]
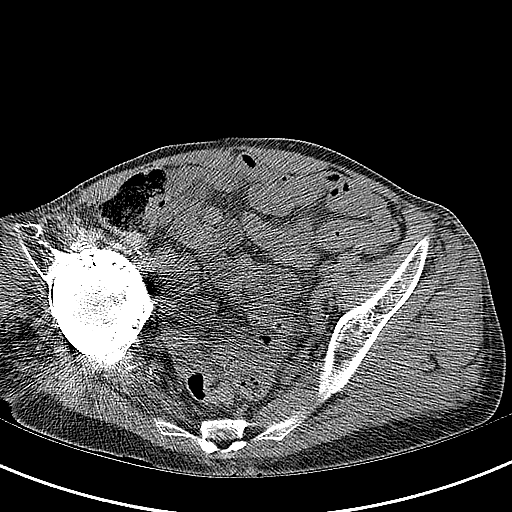
[im 32/80  soft-tissue]
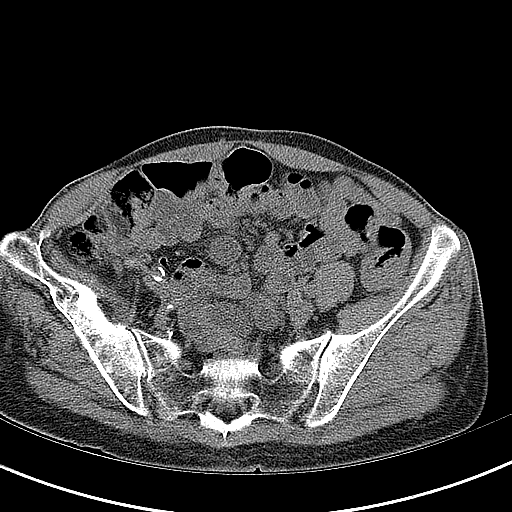
[im 37/80  soft-tissue]
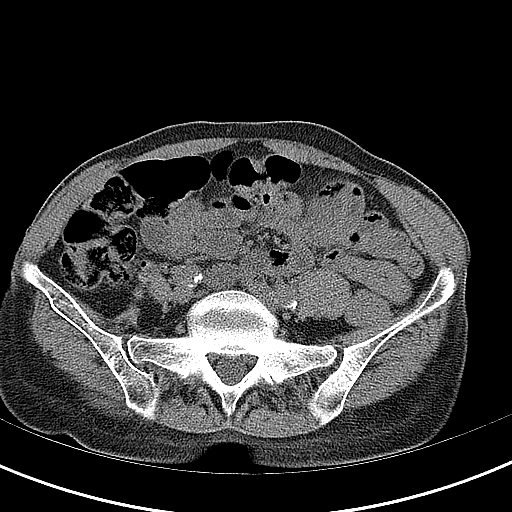
[im 43/80  soft-tissue]
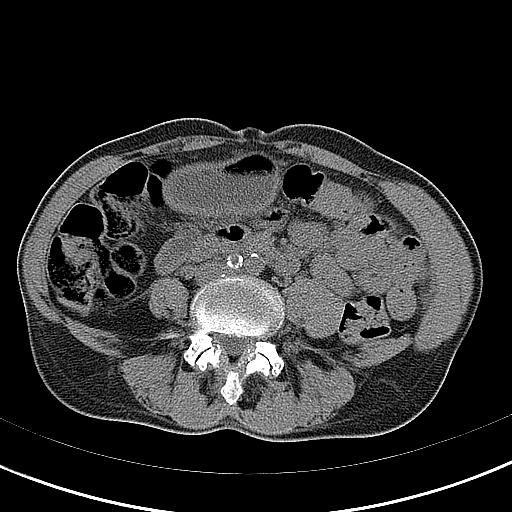
[im 48/80  soft-tissue]
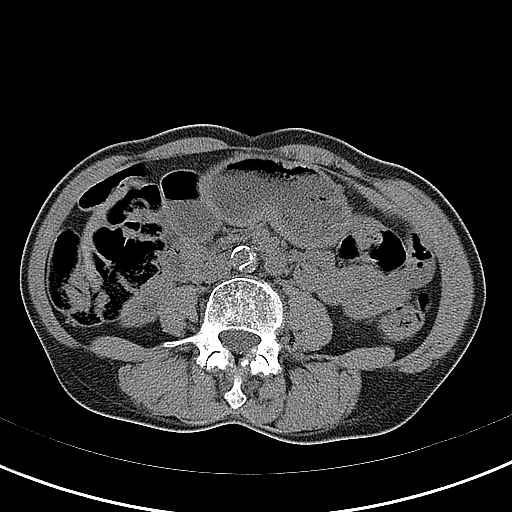
[im 53/80  soft-tissue]
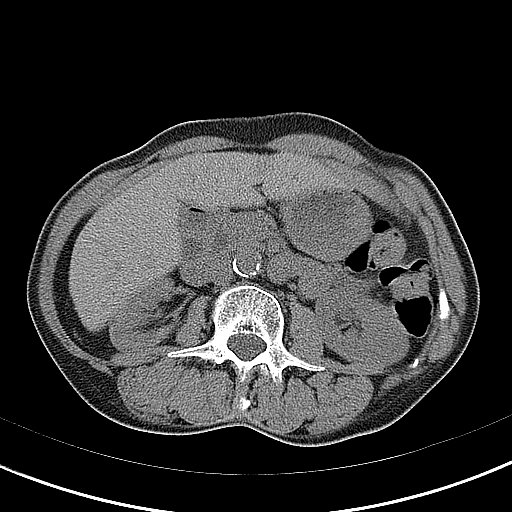
[im 53/80  bone]
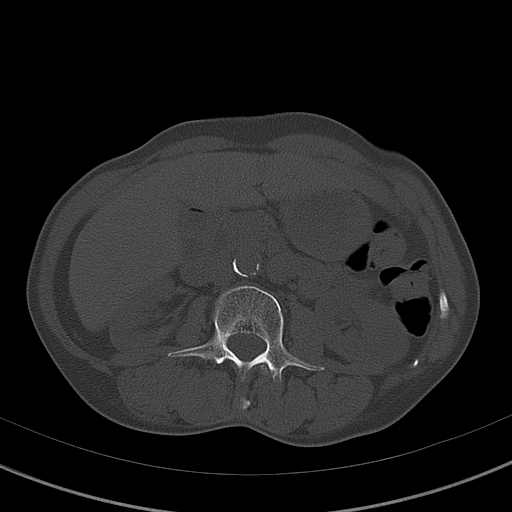
[im 58/80  lung]
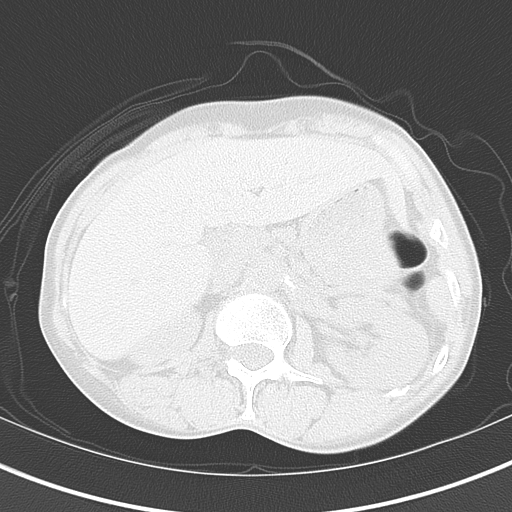
[im 64/80  soft-tissue]
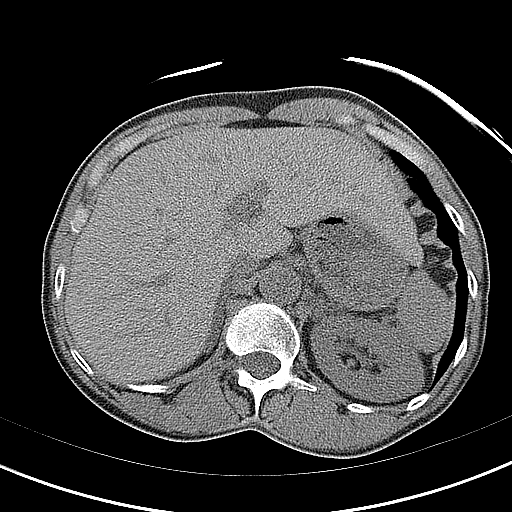
[im 64/80  lung]
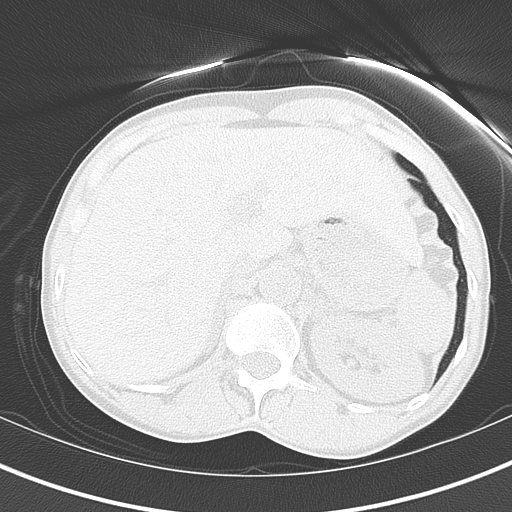
[im 69/80  soft-tissue]
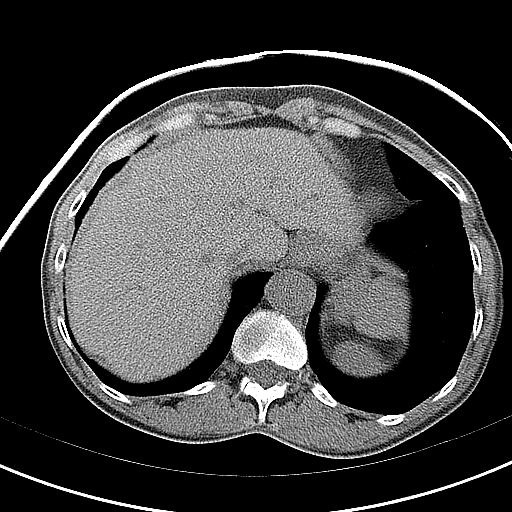
[im 69/80  lung]
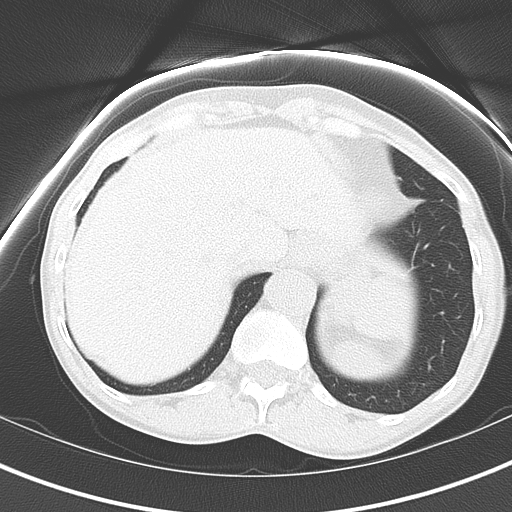
[im 74/80  soft-tissue]
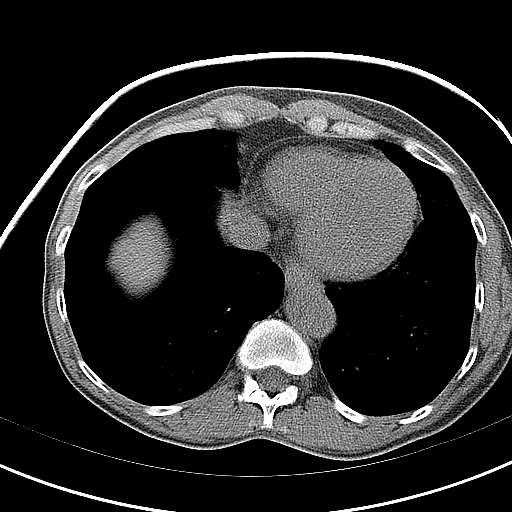
[im 74/80  lung]
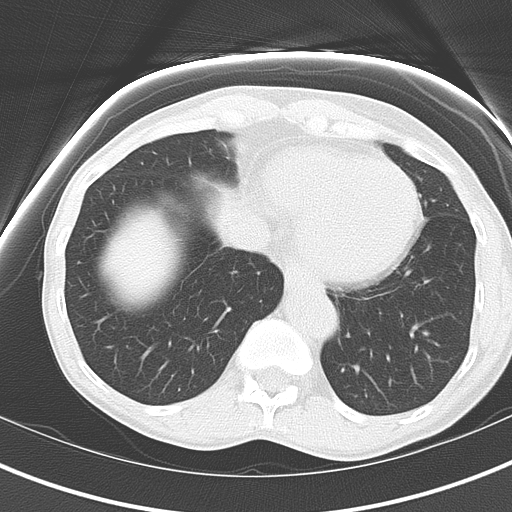

[13 of 32 positions shown; findings below may reference images not displayed]

FINDINGS: Lung bases are clear.  Mild scoliosis No acute osseous
abnormality identified.  Lower lumbar facet hypertrophy.
Visualized noncontrasted liver, gallbladder, spleen (diminutive),
pancreas (atrophy), and adrenal glands are without acute findings
evident.  There is right renal atrophy without hydronephrosis,
hydroureter or right nephrolithiasis.  There is mild compensatory
hypertrophy of the left kidney which is also nonobstructed.  No
left nephrolithiasis or left hydroureter.  Noncontrasted stomach
and duodenum are mildly distended with enteric contents.  Proximal
small bowel loops are within normal limits.  Extensive calcified
atherosclerosis of the abdominal aorta involving all major branches
and continuing in the iliac arteries.  No dilated bowel.  Stool
throughout the colon.  Nondilated appendix appears within normal
limits.  No free fluid or focal inflammatory changes identified.
IMPRESSION: 1.  No acute obstructive uropathy.  Right renal atrophy with some
compensatory hypertrophy of the left kidney.  No nephrolithiasis.
2.  Advanced aortic atherosclerosis.

CT PELVIS
FINDINGS: The base of the chronic osseous deformity of the right
iliac bone including superior migration of the right total hip
arthroplasty with extensive adjacent bony sclerosis and
remodelling, not significantly changed in appearance since the
acute abdominal series from 05/06/2006.  Abnormal mineralization of
the visualized proximal right femur with extensive lucency
surrounding the proximal right femoral hardware.  The streak
artifact from hardware limits visualization of the right hemi
pelvis and deep pelvis.  Chronic deformity of the right inferior
pubic ramus.  Sacrum and left hemi pelvis appear intact and within
normal limits.  Multiple fluid-filled but nondilated distal small
bowel loops occupy the pelvis.  There is stool throughout the
distal colon.  No definite free fluid.  Fibroid uterus with some
flocculent calcifications.  Visualized bladder is within normal
limits.  Pelvic atherosclerosis is advanced.
IMPRESSION: 1.  Severe chronic changes of the right total hip arthroplasty,
including superior migration of the acetabular component, appear
radiographically stable since 6990.  Extensive lucency around the
visualized femoral hardware again noted.
2.  Streak artifact from the hardware limits visualization in the
pelvis.  No acute findings identified.

## 2010-02-21 ENCOUNTER — Emergency Department (HOSPITAL_COMMUNITY): Admission: EM | Admit: 2010-02-21 | Discharge: 2010-02-22 | Payer: Self-pay | Admitting: Emergency Medicine

## 2010-04-01 IMAGING — CT CT HEAD W/O CM
1 series · 16 of 30 positions shown, 20 images · non-contrast
Comparison: 06/07/2007

CLINICAL DATA: Headache.  Hypertension.

CT HEAD WITHOUT CONTRAST
TECHNIQUE: Contiguous axial images were obtained from the base of
the skull through the vertex without contrast.

[Series 2: head routine 4.8 h37s · axial · 0.43mm/px · z∈[+1098,+1231]mm · 16 of 30 slices shown, 20 images]
[im 2/30  brain]
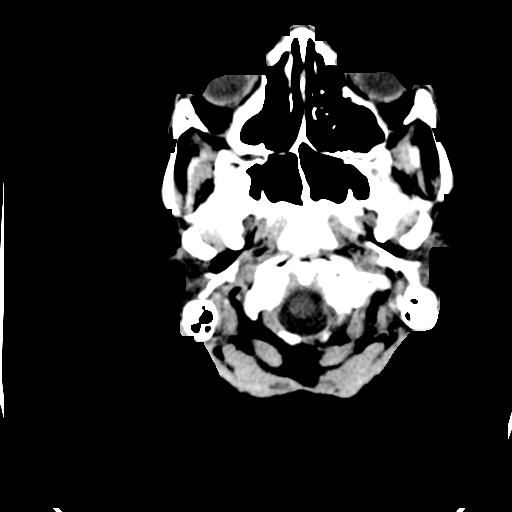
[im 2/30  bone]
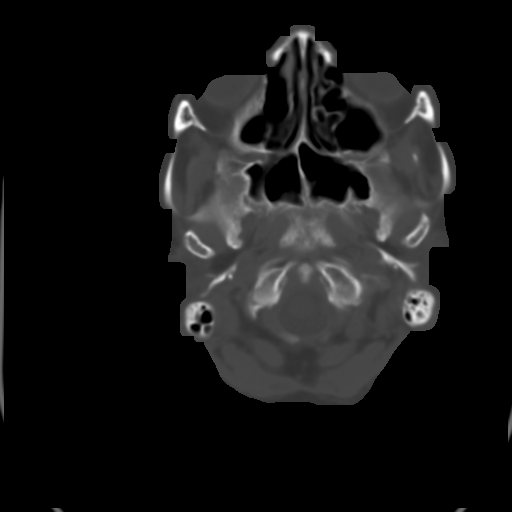
[im 4/30  brain]
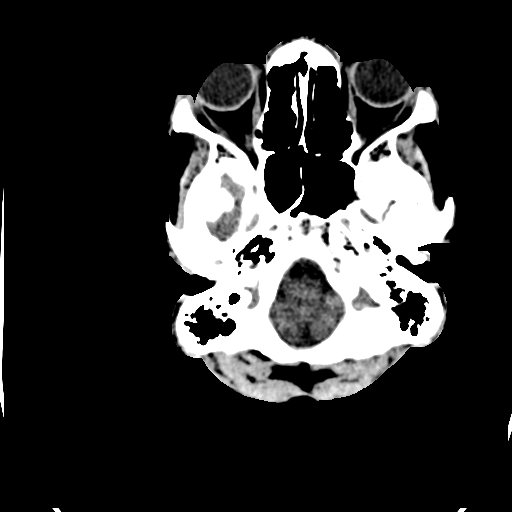
[im 6/30  brain]
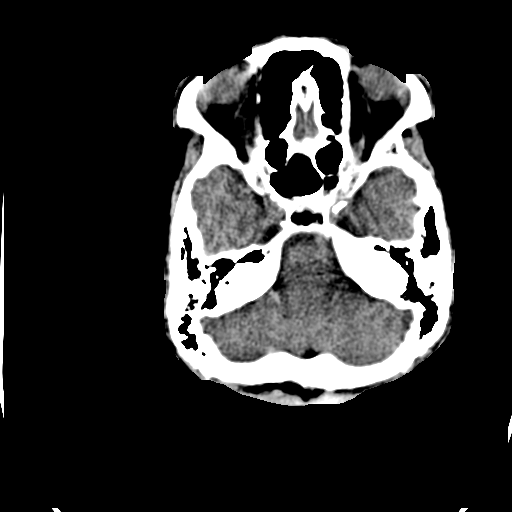
[im 8/30  brain]
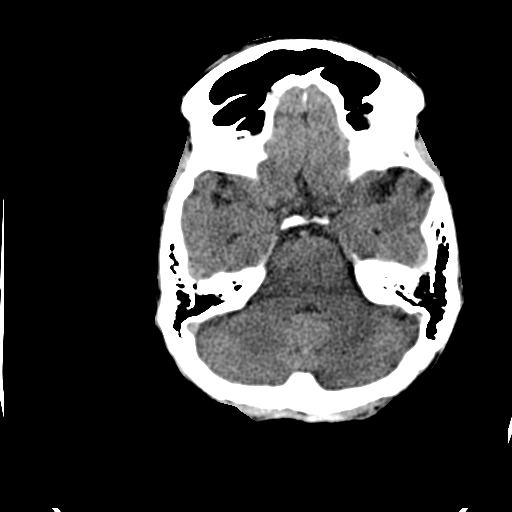
[im 9/30  brain]
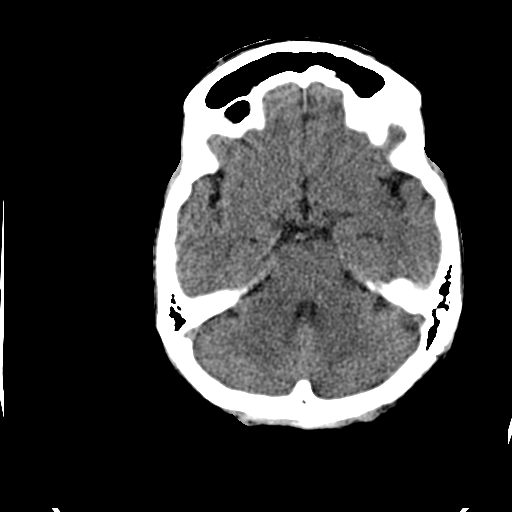
[im 9/30  bone]
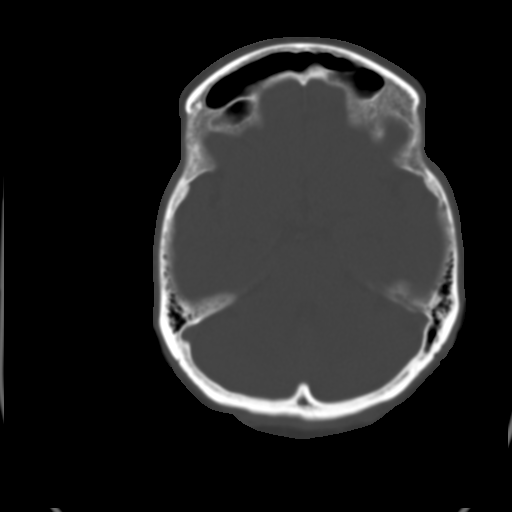
[im 11/30  brain]
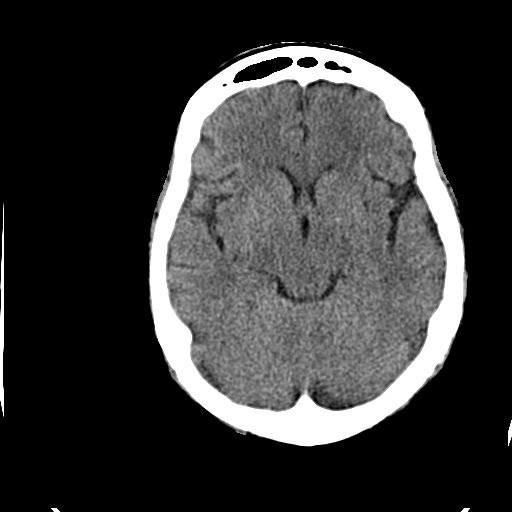
[im 13/30  brain]
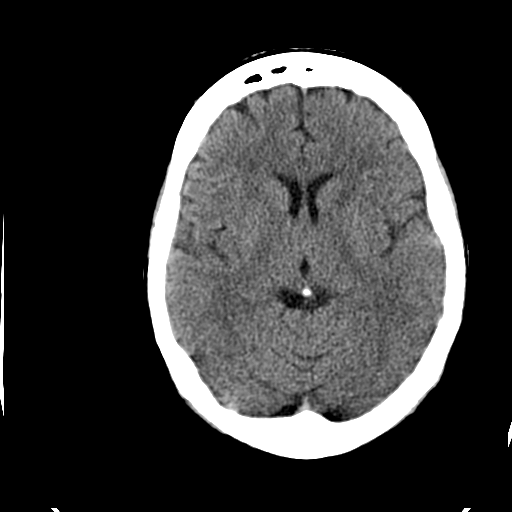
[im 15/30  brain]
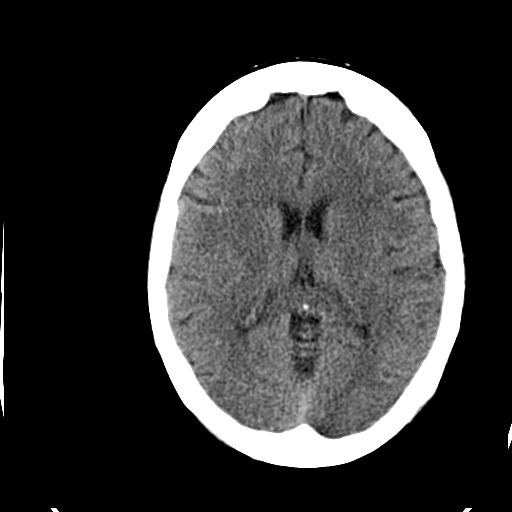
[im 16/30  brain]
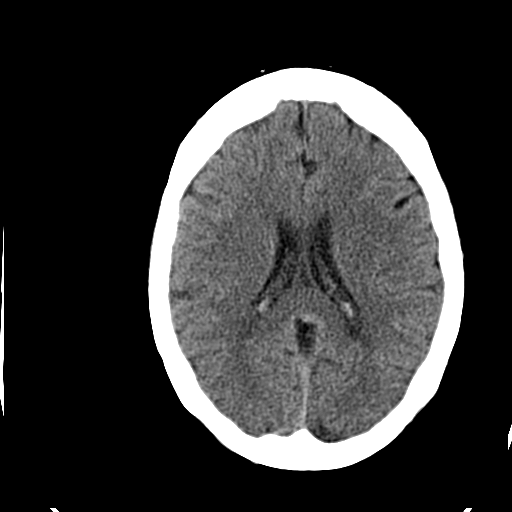
[im 16/30  bone]
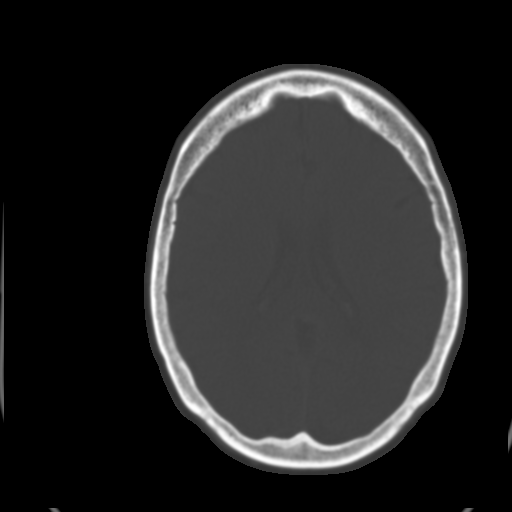
[im 18/30  brain]
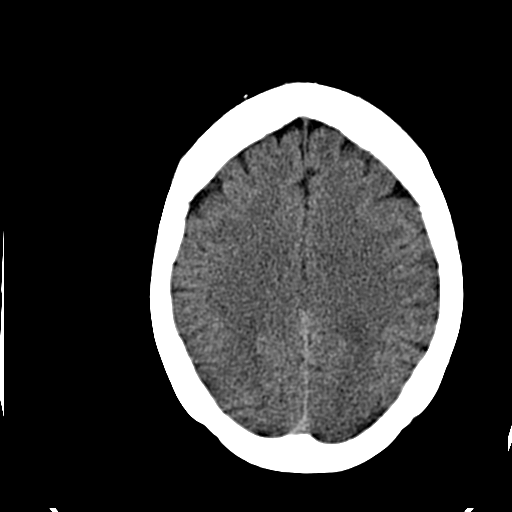
[im 20/30  brain]
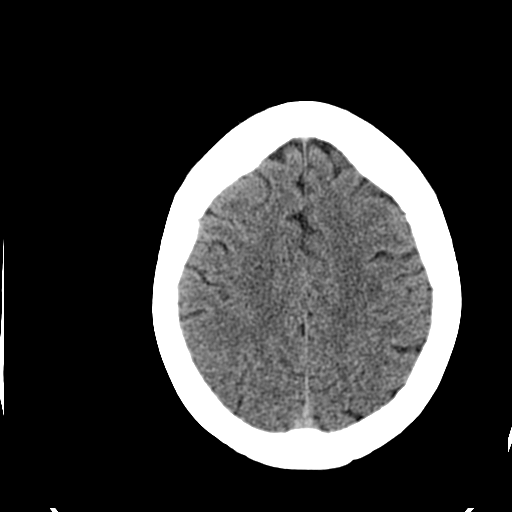
[im 22/30  brain]
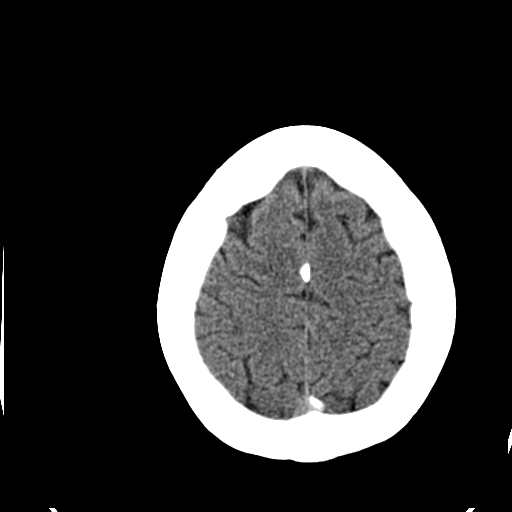
[im 23/30  brain]
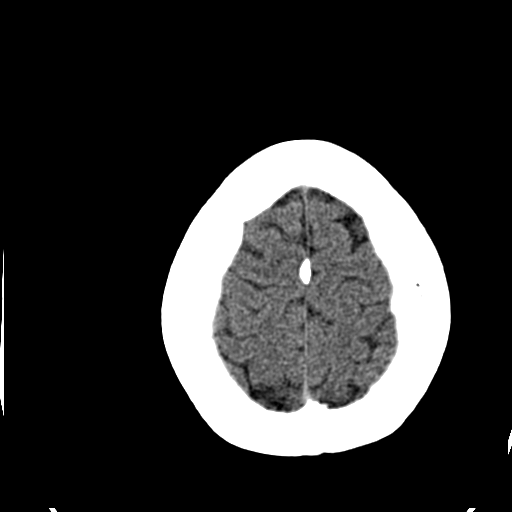
[im 23/30  bone]
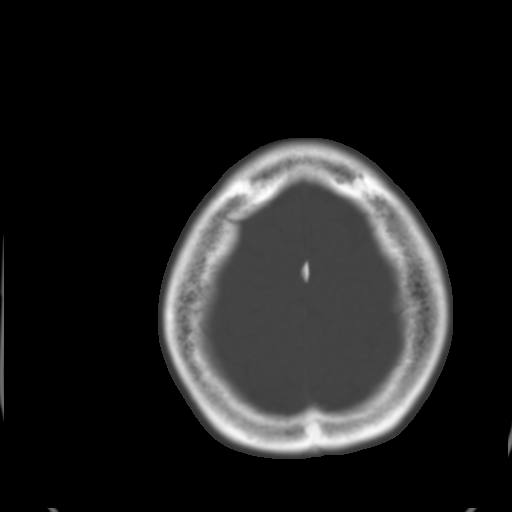
[im 25/30  brain]
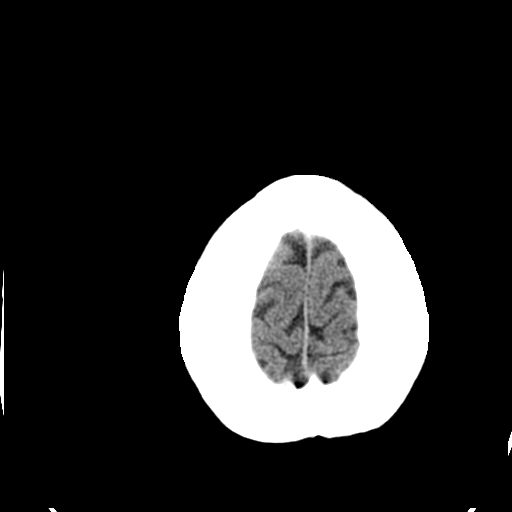
[im 27/30  brain]
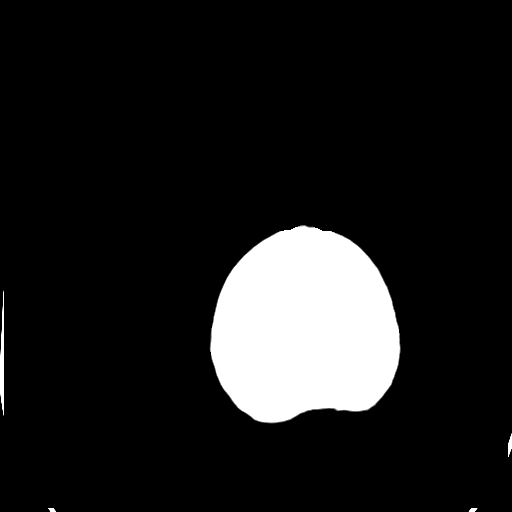
[im 29/30  brain]
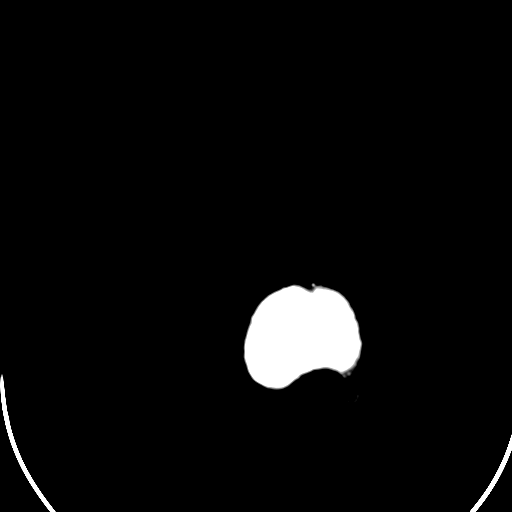

[16 of 30 positions shown; findings below may reference images not displayed]

FINDINGS: The ventricles are normal.  No extra-axial fluid
collections are seen.  The brainstem and cerebellum are
unremarkable.  No acute intracranial findings or mass lesions.

The bony calvarium is intact.  The visualized paranasal sinuses and
mastoid air cells are clear. Chronic right maxillary sinus wall
thickening and postoperative changes.
IMPRESSION: 1.  No acute intracranial findings or mass lesions. No change since
prior study.
2.  Intact bony calvarium and clear paranasal sinuses.

## 2010-04-01 IMAGING — CR DG CHEST 2V
2 series · 2 of 2 positions shown · non-contrast
Comparison: Chest x-ray of 12/21/2007

CLINICAL DATA: Anxiety, confusion

CHEST - 2 VIEW

[w chest pa]
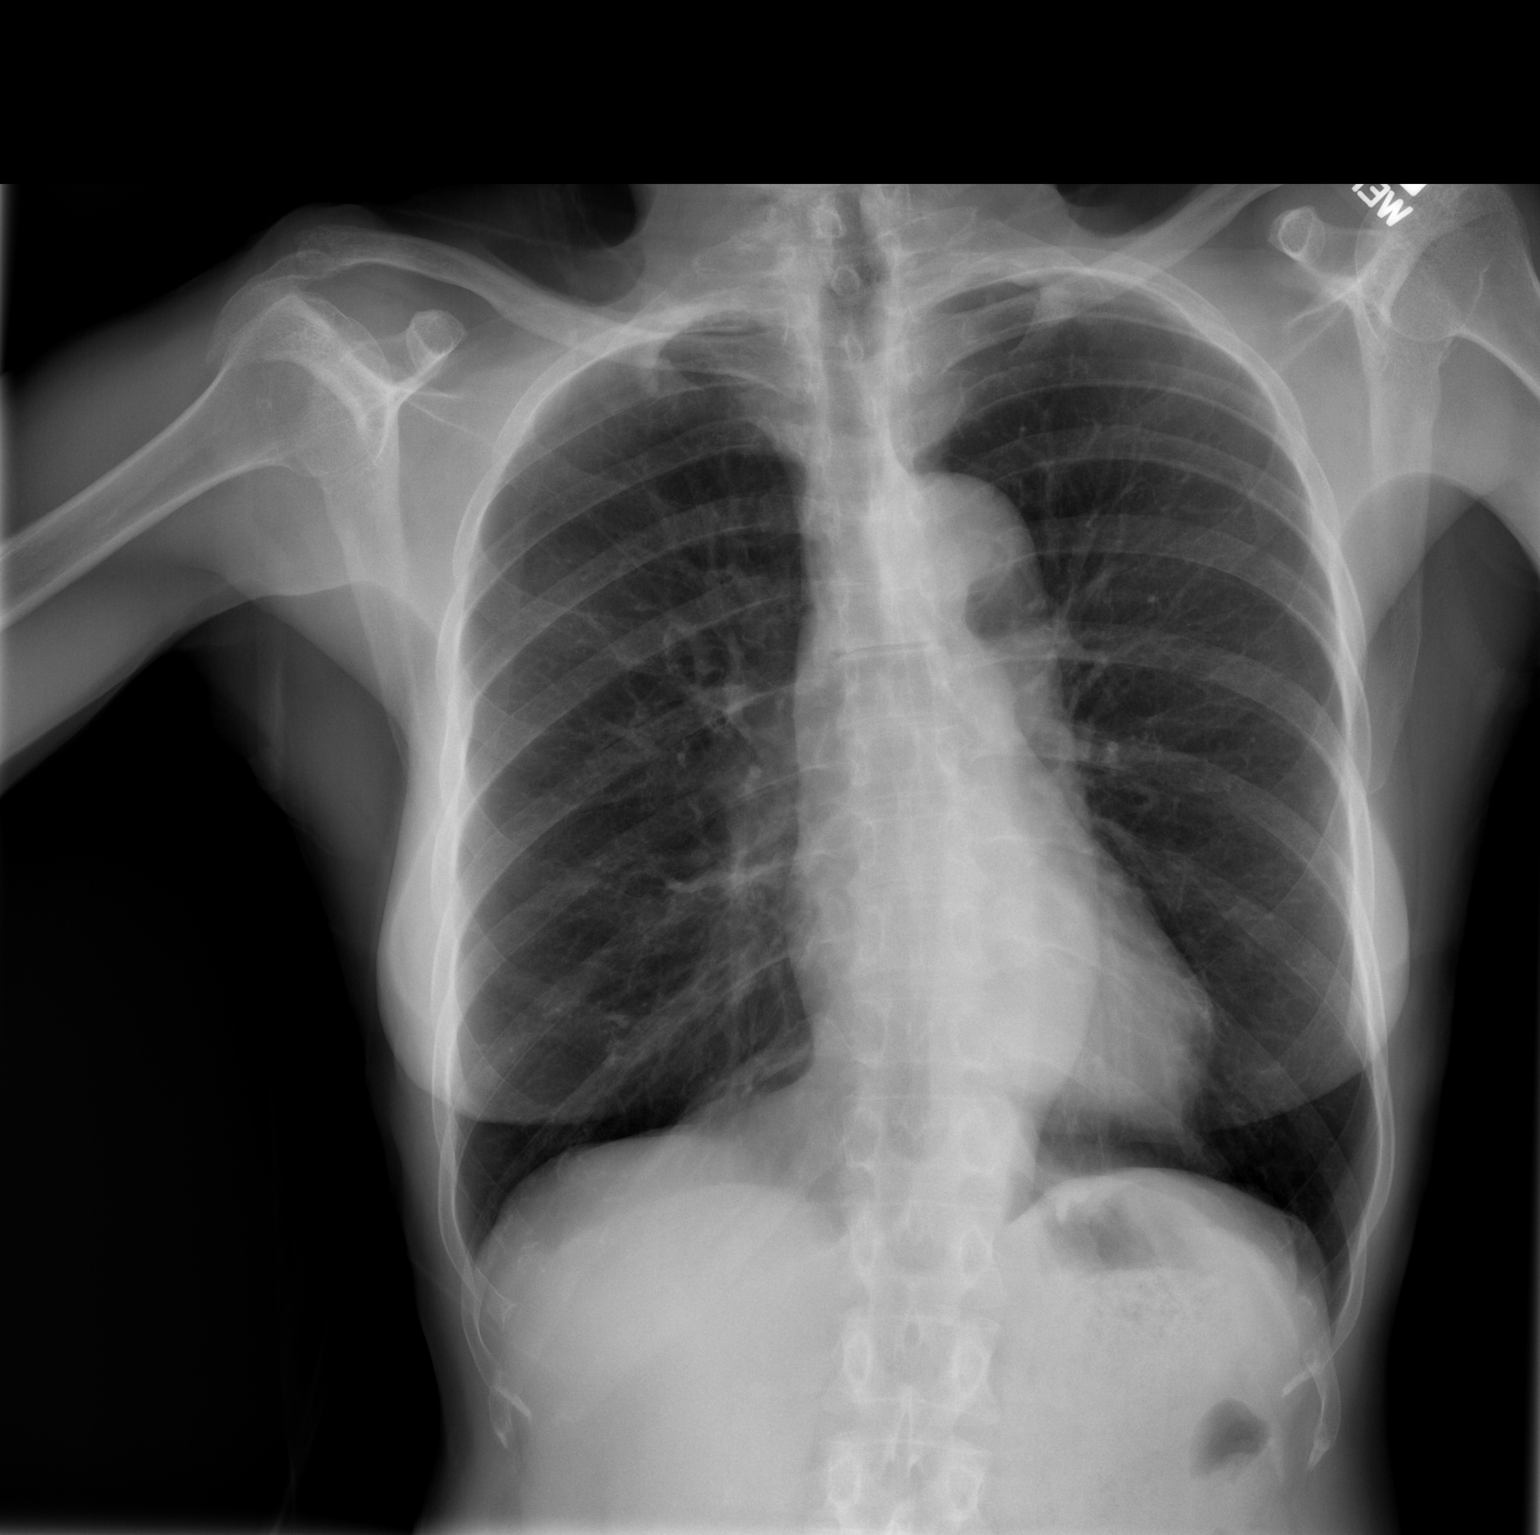

[w chest lat]
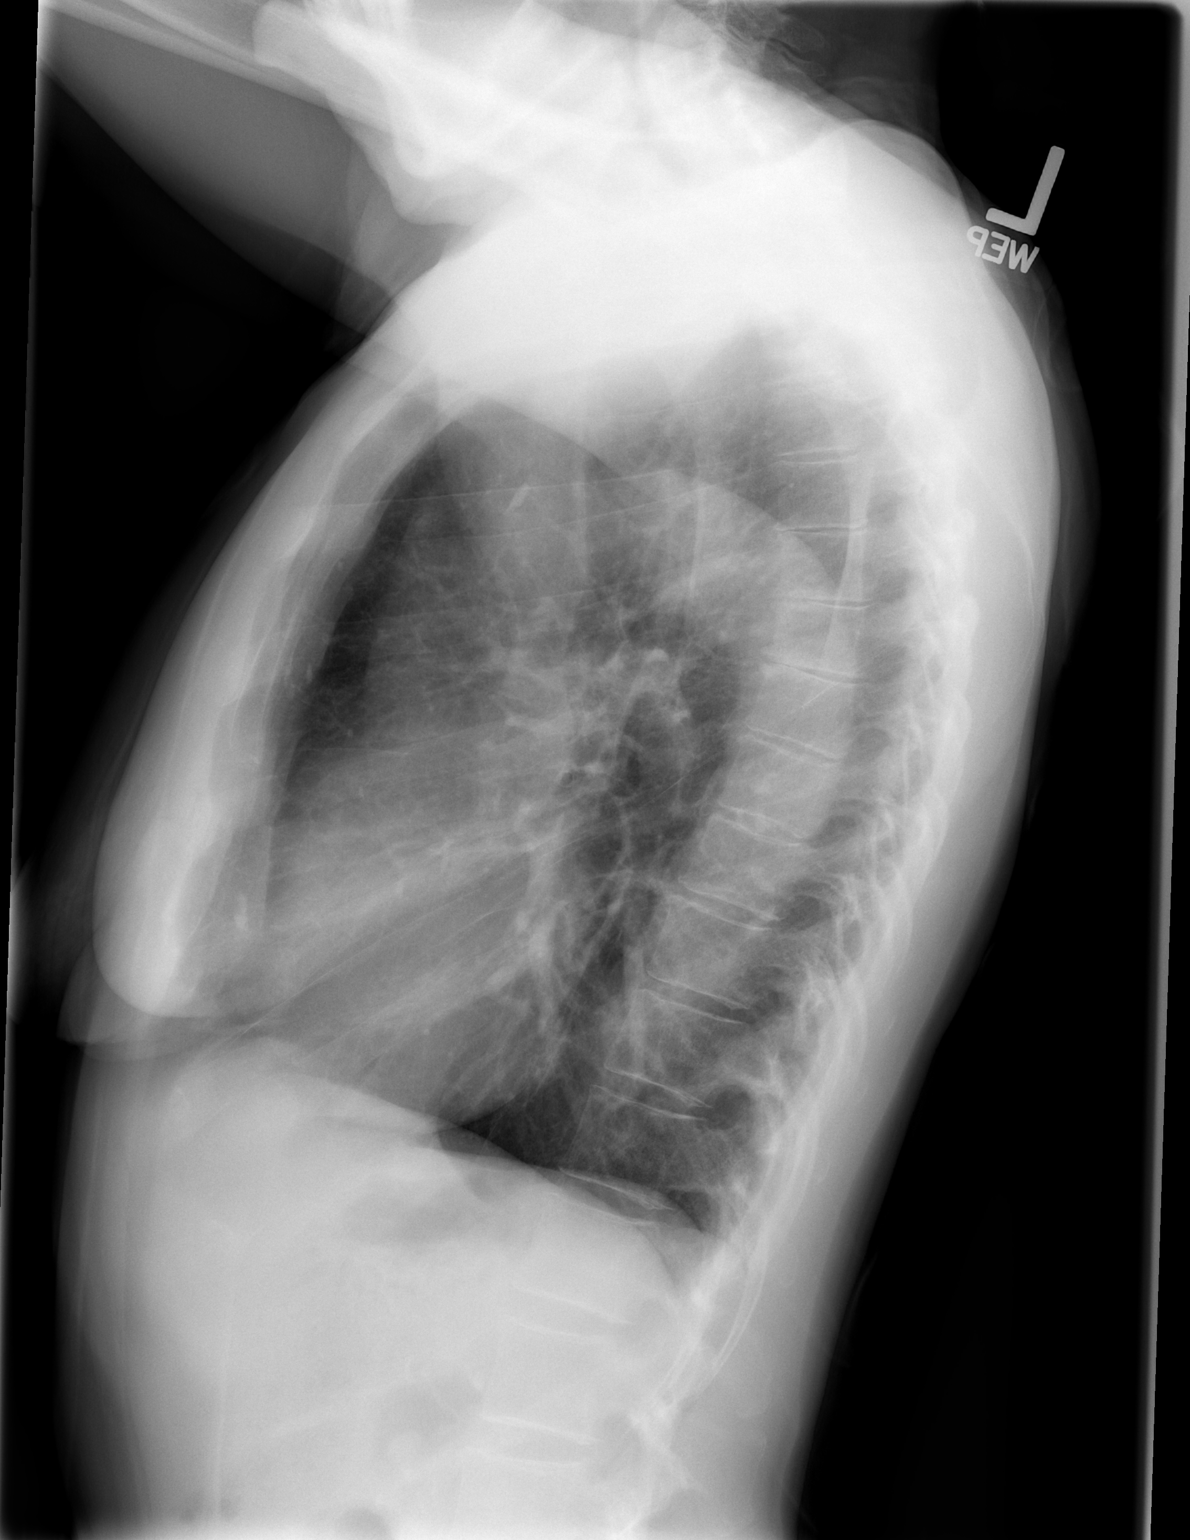

[2 of 2 positions shown; findings below may reference images not displayed]

FINDINGS: The lungs remain clear but hyperaerated consistent with
COPD. The heart is within normal limits in size. No bony
abnormality is seen.
IMPRESSION: No active lung disease. COPD.

## 2010-05-01 ENCOUNTER — Emergency Department (HOSPITAL_COMMUNITY): Admission: EM | Admit: 2010-05-01 | Discharge: 2010-05-01 | Payer: Self-pay | Admitting: Emergency Medicine

## 2010-06-18 ENCOUNTER — Ambulatory Visit: Payer: Self-pay | Admitting: Vascular Surgery

## 2010-08-07 ENCOUNTER — Emergency Department (HOSPITAL_COMMUNITY): Admission: EM | Admit: 2010-08-07 | Discharge: 2010-08-07 | Payer: Self-pay | Admitting: Emergency Medicine

## 2010-08-21 ENCOUNTER — Ambulatory Visit (HOSPITAL_COMMUNITY): Admission: RE | Admit: 2010-08-21 | Discharge: 2010-08-21 | Payer: Self-pay | Admitting: Vascular Surgery

## 2010-08-21 ENCOUNTER — Ambulatory Visit: Payer: Self-pay | Admitting: Vascular Surgery

## 2010-08-31 ENCOUNTER — Inpatient Hospital Stay (HOSPITAL_COMMUNITY): Admission: RE | Admit: 2010-08-31 | Discharge: 2010-09-02 | Payer: Self-pay | Admitting: Vascular Surgery

## 2010-08-31 HISTORY — PX: FEMORAL-FEMORAL BYPASS GRAFT: SHX936

## 2010-09-02 ENCOUNTER — Encounter: Payer: Self-pay | Admitting: Vascular Surgery

## 2010-09-02 ENCOUNTER — Ambulatory Visit: Payer: Self-pay | Admitting: Vascular Surgery

## 2010-09-24 ENCOUNTER — Ambulatory Visit: Payer: Self-pay | Admitting: Vascular Surgery

## 2010-10-05 IMAGING — CR DG HIP COMPLETE 2+V*R*
2 series · 2 of 2 positions shown · non-contrast
Comparison: 09/01/2007.

CLINICAL DATA: Right hip pain

RIGHT HIP - COMPLETE 2+ VIEW

[t hip ap right]
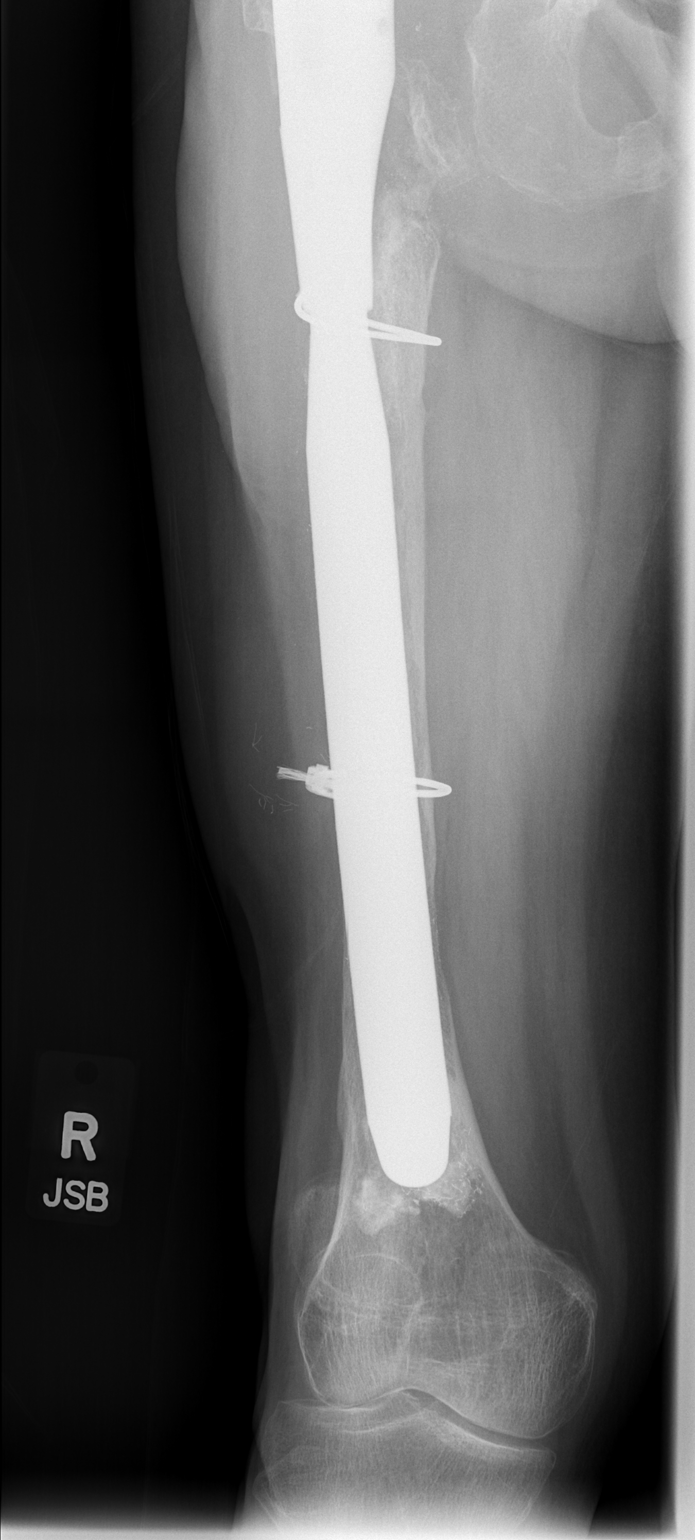

[t hip frog leg right]
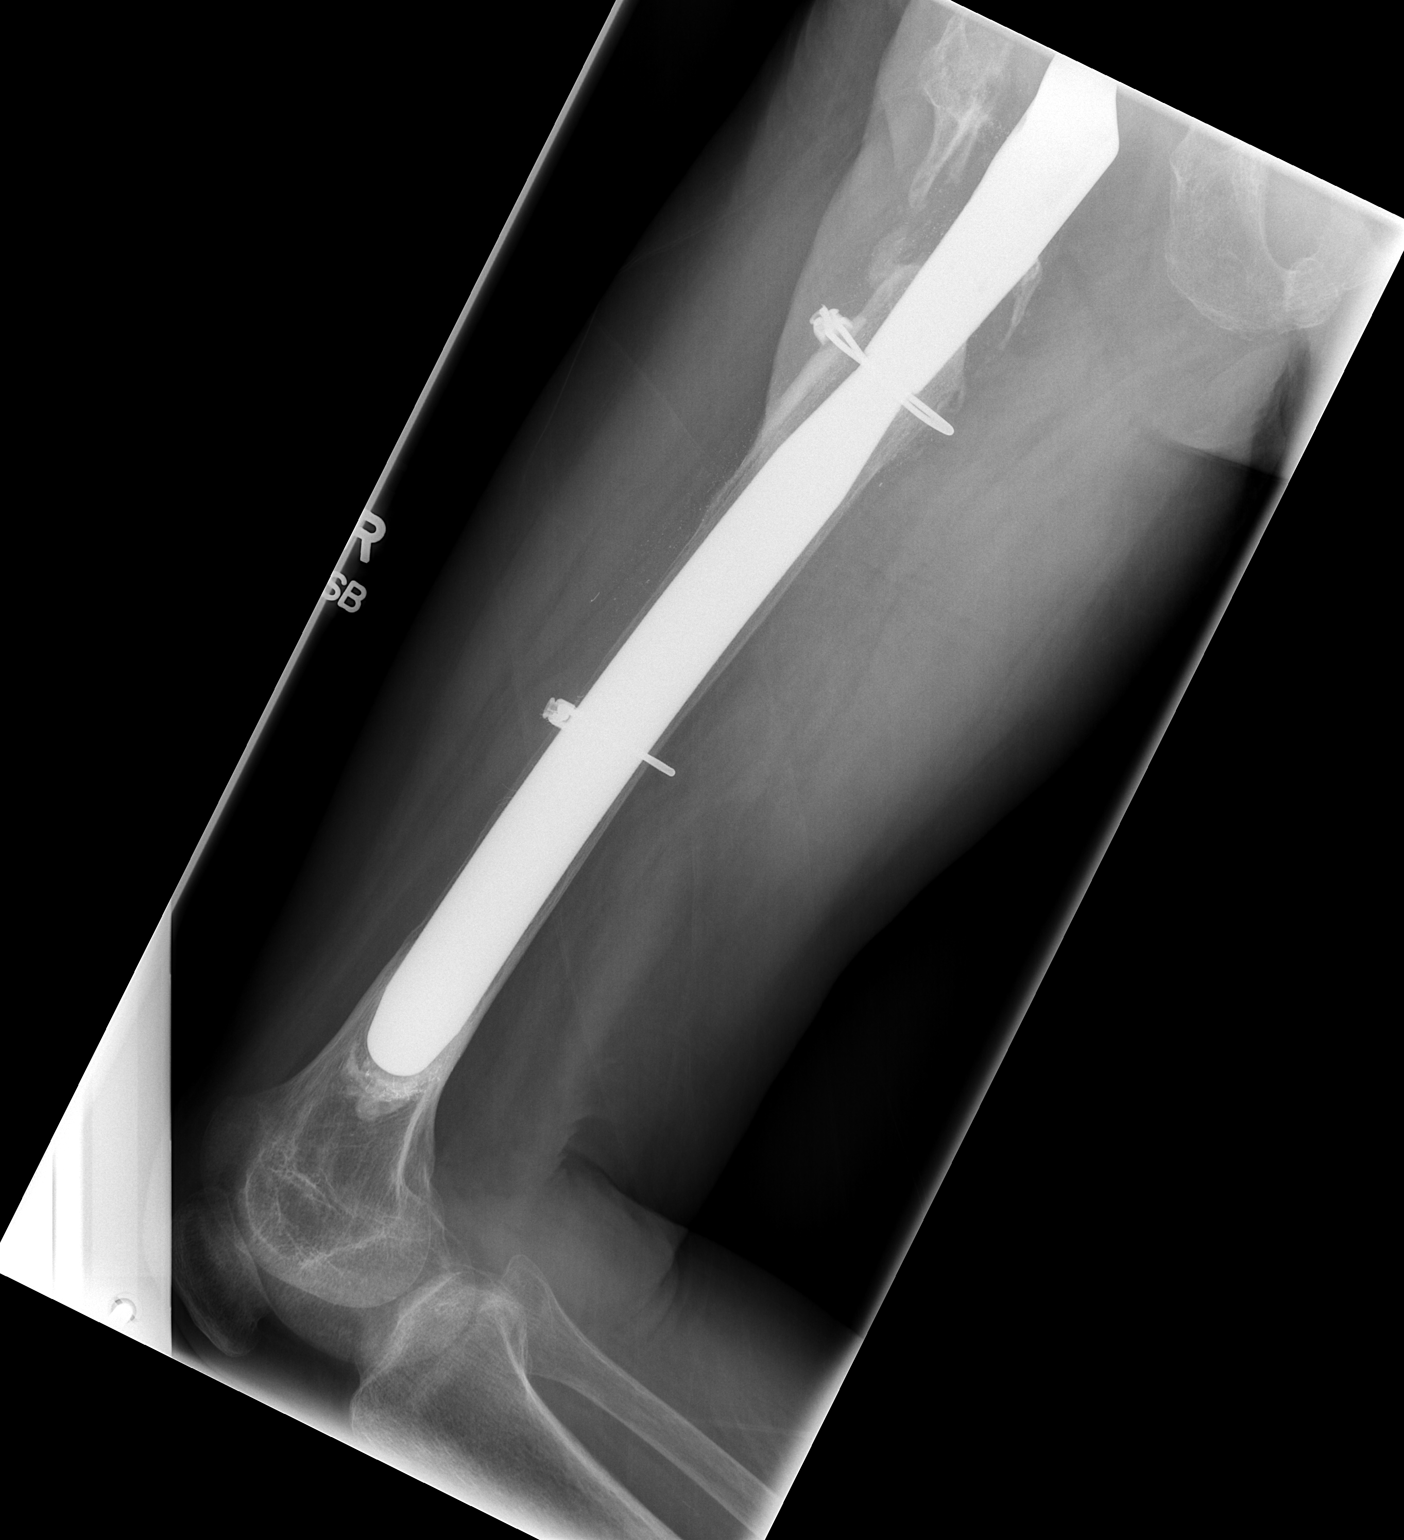

[2 of 2 positions shown; findings below may reference images not displayed]

FINDINGS: The patient is status post a right total hip replacement
with a long femoral rod.  Imaging features are stable without
evidence for periprosthetic fracture.  The femoral component is
stable in location and appears located on this study.  Protrusio of
the right acetabular cup is stable.  Chronic fracture of the right
inferior pubic ramus is again noted.
IMPRESSION: Status post right total hip replacement with long femoral nail.  No
new or acute interval findings.

## 2010-10-05 IMAGING — CT CT PELVIS W/ CM
2 of 3 series · 16 of 46 positions shown, 18 images · IV contrast (agent unspecified)
Comparison: Plain film exam from earlier today.

CT PELVIS

CLINICAL DATA: Right hip and leg pain

CT PELVIS AND RIGHT LOWER EXTREMITY WITH CONTRAST
TECHNIQUE: Multidetector CT imaging of the pelvis and right lower
extremity was performed according to the standard protocol
following bolus administration of intravenous contrast. Multiplanar
CT image reconstructions were also generated.
Contrast: 80 ml Dmnipaque-ABB

[Series 7: hip 2.0 b40f · axial · 0.74mm/px · z∈[-506,-26]mm · 13 of 276 slices shown, 15 images]
[im 18/276  soft-tissue]
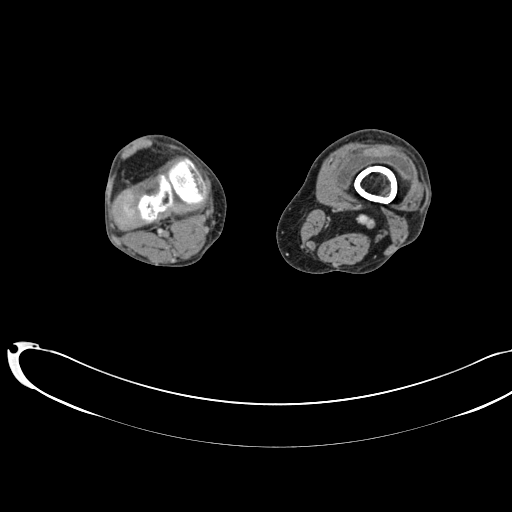
[im 18/276  bone]
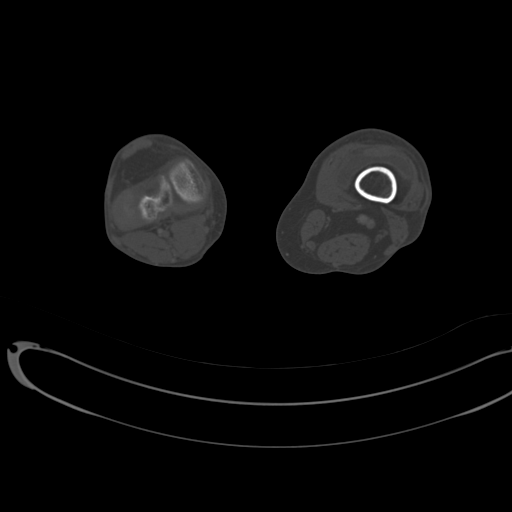
[im 36/276  soft-tissue]
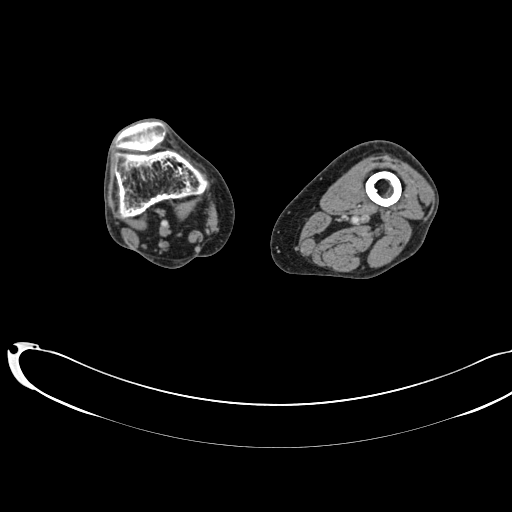
[im 54/276  soft-tissue]
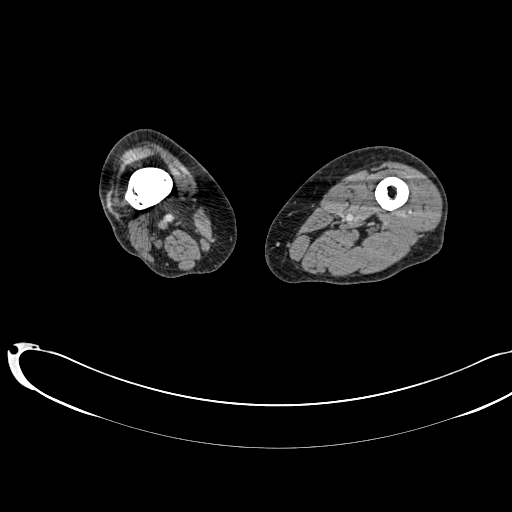
[im 80/276  soft-tissue]
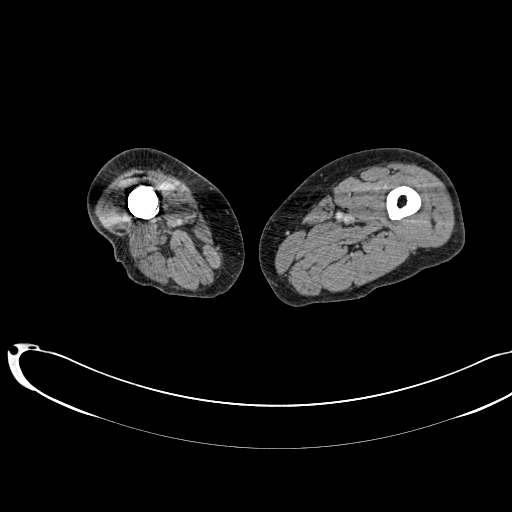
[im 98/276  soft-tissue]
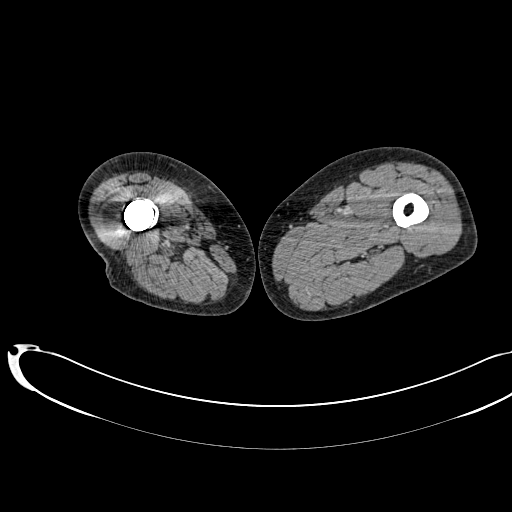
[im 116/276  soft-tissue]
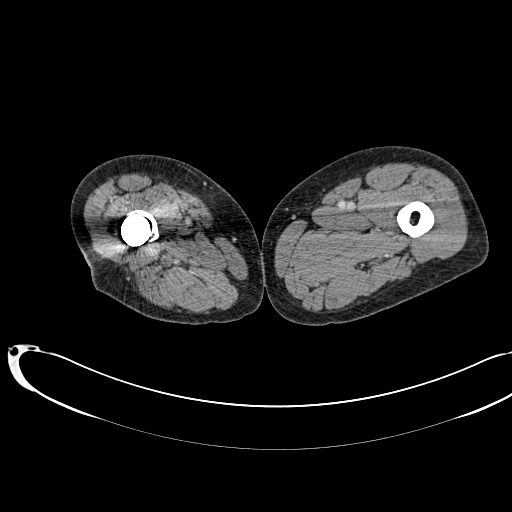
[im 142/276  soft-tissue]
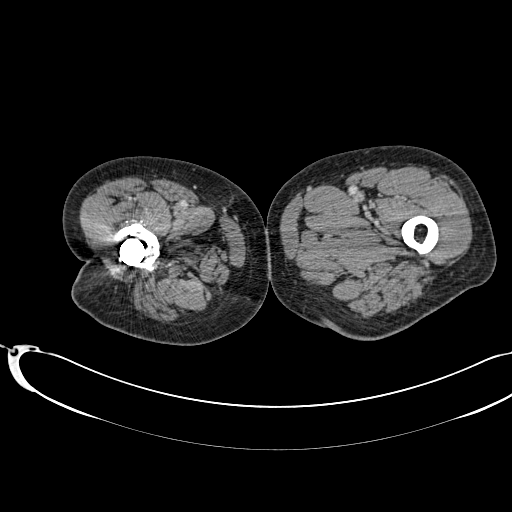
[im 160/276  soft-tissue]
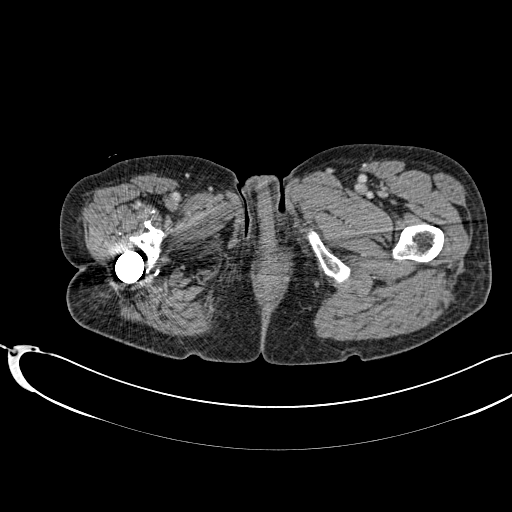
[im 178/276  soft-tissue]
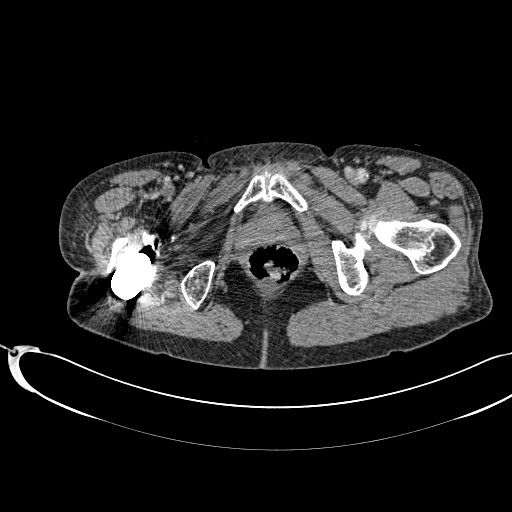
[im 178/276  bone]
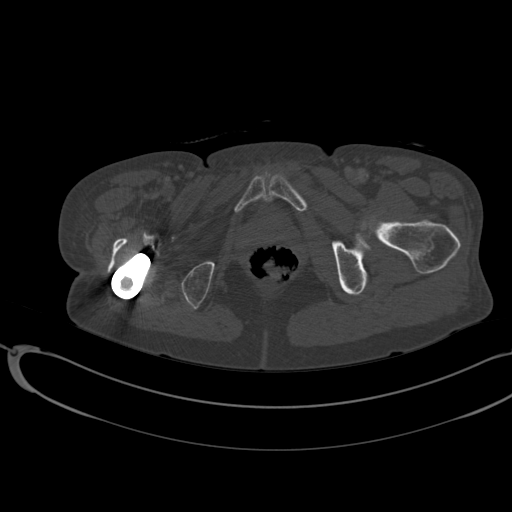
[im 196/276  soft-tissue]
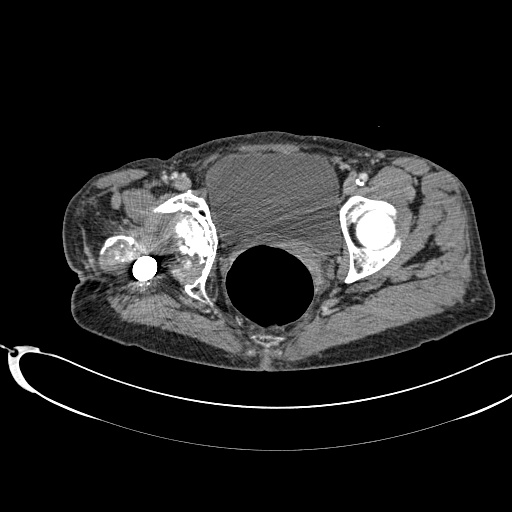
[im 222/276  soft-tissue]
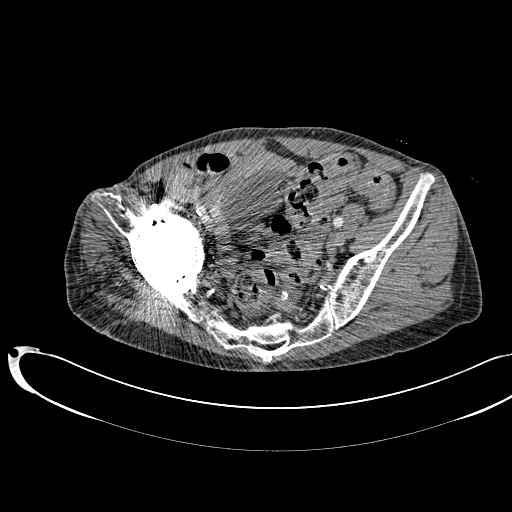
[im 240/276  soft-tissue]
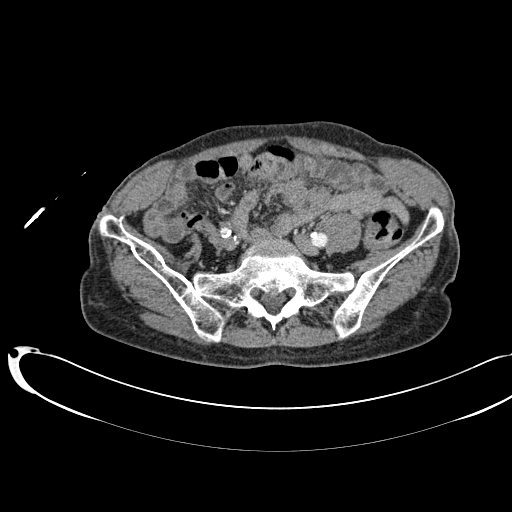
[im 258/276  soft-tissue]
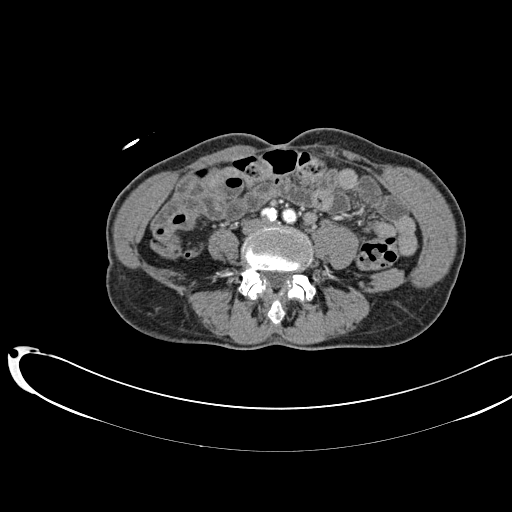

[Series 602: coronals · coronal · 1.08mm/px · 3 of 77 slices shown]
[im 26/77  soft-tissue]
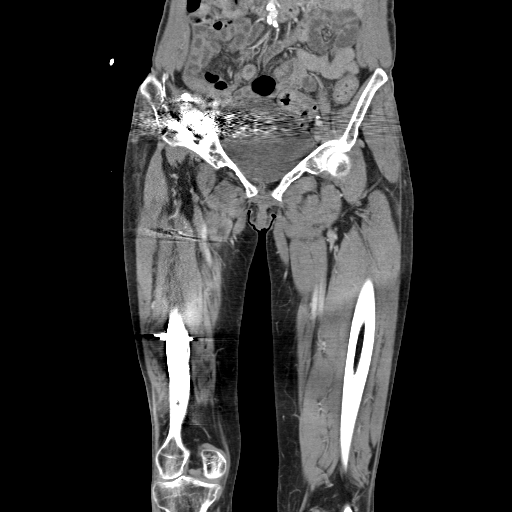
[im 34/77  soft-tissue]
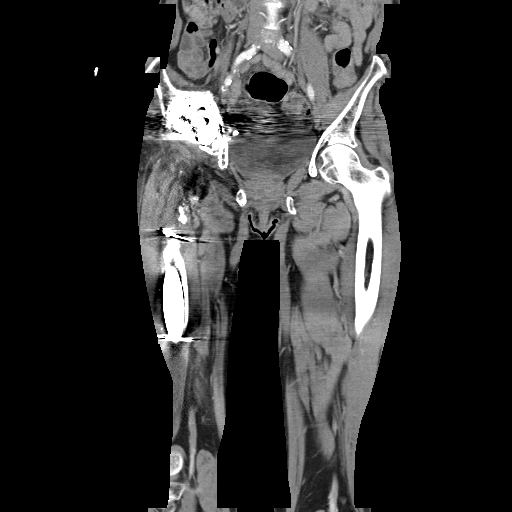
[im 43/77  soft-tissue]
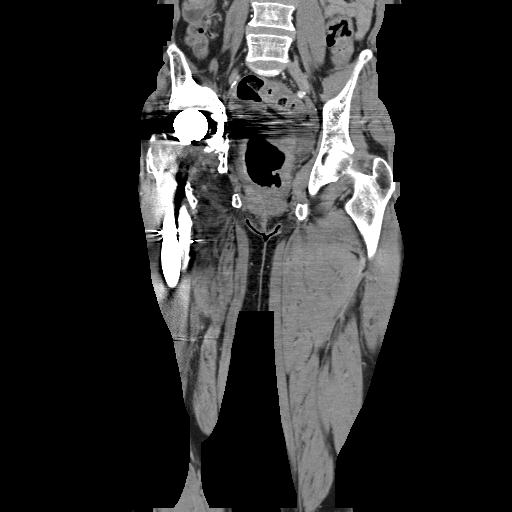

[16 of 46 positions shown; findings below may reference images not displayed]

FINDINGS: Long segment stenosis of the right common and external
iliac arteries noted secondary to atherosclerotic disease.  The
left hip prosthesis generates substantial streak artifact through
the lower pelvis, largely obscuring anatomic detail.  There is
evidence for some diverticular disease in the left colon.  Bladder
is not well seen.  Calcification in the uterus suggest fibroid
change.

Protrusio of the acetabular component is evident and lucency around
a component may be related to small particle disease.  Healed
fracture of the right inferior pubic ramus is noted.  No
abnormality within the right iliopsoas complex of the pelvis.
IMPRESSION: No acute findings.

CT RIGHT LOWER EXTREMITY
FINDINGS: Imaging was continued from the pelvis through the right
knee.  The femur is markedly demineralized and much of the distal
diaphysis appears to be only a thin rim of bone around the
medullary rod.  I cannot identify a definite lateral cortex through
the mid and distal diaphysis.

The patient does have a small fluid collection adjacent to the
prosthesis, the junction of the middle and distal thirds.  This is
best appreciated on axial images (series [DATE].  This fluid
collection basically surrounds the fastener of the more distal of
the two cerclage bands.  Given the focality of the fluid and its
association with this band, I suspect it represents a chronic fluid
collection secondary to irritation of the overlying musculature,
but I cannot entirely exclude that this represents a small abscess
collection.

A joint effusion is seen in the left knee, which has been imaged
incidentally.
IMPRESSION: Small fluid collection seen adjacent to the medullary nail, near
the junction of the middle and distal thirds.  The fluid
encompasses the fastener of the distal most cerclage band and may
represent fluid accumulation secondary to irritation of the
overlying soft tissues, but a small abscess cannot be excluded.

## 2010-10-18 HISTORY — PX: JOINT REPLACEMENT: SHX530

## 2010-11-08 ENCOUNTER — Encounter: Payer: Self-pay | Admitting: Orthopedic Surgery

## 2010-12-30 LAB — URINALYSIS, ROUTINE W REFLEX MICROSCOPIC
Bilirubin Urine: NEGATIVE
Glucose, UA: NEGATIVE mg/dL
Hgb urine dipstick: NEGATIVE
Ketones, ur: NEGATIVE mg/dL
Nitrite: NEGATIVE
Protein, ur: NEGATIVE mg/dL
Specific Gravity, Urine: 1.014 (ref 1.005–1.030)
Urobilinogen, UA: 0.2 mg/dL (ref 0.0–1.0)
pH: 5.5 (ref 5.0–8.0)

## 2010-12-30 LAB — CBC
HCT: 24.7 % — ABNORMAL LOW (ref 36.0–46.0)
HCT: 32.8 % — ABNORMAL LOW (ref 36.0–46.0)
Hemoglobin: 11.2 g/dL — ABNORMAL LOW (ref 12.0–15.0)
Hemoglobin: 8.4 g/dL — ABNORMAL LOW (ref 12.0–15.0)
MCH: 29.1 pg (ref 26.0–34.0)
MCH: 29.1 pg (ref 26.0–34.0)
MCHC: 34 g/dL (ref 30.0–36.0)
MCHC: 34.1 g/dL (ref 30.0–36.0)
MCV: 85.2 fL (ref 78.0–100.0)
MCV: 85.5 fL (ref 78.0–100.0)
Platelets: 302 10*3/uL (ref 150–400)
Platelets: 369 10*3/uL (ref 150–400)
RBC: 2.89 MIL/uL — ABNORMAL LOW (ref 3.87–5.11)
RBC: 3.85 MIL/uL — ABNORMAL LOW (ref 3.87–5.11)
RDW: 16.2 % — ABNORMAL HIGH (ref 11.5–15.5)
RDW: 16.3 % — ABNORMAL HIGH (ref 11.5–15.5)
WBC: 11.1 10*3/uL — ABNORMAL HIGH (ref 4.0–10.5)
WBC: 9.5 10*3/uL (ref 4.0–10.5)

## 2010-12-30 LAB — POCT I-STAT, CHEM 8
BUN: 30 mg/dL — ABNORMAL HIGH (ref 6–23)
Calcium, Ion: 1.33 mmol/L — ABNORMAL HIGH (ref 1.12–1.32)
Chloride: 120 mEq/L — ABNORMAL HIGH (ref 96–112)
Creatinine, Ser: 1.3 mg/dL — ABNORMAL HIGH (ref 0.4–1.2)
Glucose, Bld: 101 mg/dL — ABNORMAL HIGH (ref 70–99)
HCT: 34 % — ABNORMAL LOW (ref 36.0–46.0)
Hemoglobin: 11.6 g/dL — ABNORMAL LOW (ref 12.0–15.0)
Potassium: 4.4 mEq/L (ref 3.5–5.1)
Sodium: 144 mEq/L (ref 135–145)
TCO2: 17 mmol/L (ref 0–100)

## 2010-12-30 LAB — BASIC METABOLIC PANEL
BUN: 18 mg/dL (ref 6–23)
CO2: 21 mEq/L (ref 19–32)
Calcium: 8.8 mg/dL (ref 8.4–10.5)
Chloride: 108 mEq/L (ref 96–112)
Creatinine, Ser: 1.09 mg/dL (ref 0.4–1.2)
GFR calc Af Amer: >60 mL/min (ref >60)
GFR calc non Af Amer: 50 mL/min — ABNORMAL LOW (ref >60)
Glucose, Bld: 152 mg/dL — ABNORMAL HIGH (ref 70–99)
Potassium: 4.2 mEq/L (ref 3.5–5.1)
Sodium: 140 mEq/L (ref 135–145)

## 2010-12-30 LAB — PROTIME-INR
INR: 1 (ref 0.00–1.49)
Prothrombin Time: 13.4 seconds (ref 11.6–15.2)

## 2010-12-30 LAB — COMPREHENSIVE METABOLIC PANEL
ALT: 22 U/L (ref 0–35)
AST: 27 U/L (ref 0–37)
Albumin: 4.1 g/dL (ref 3.5–5.2)
Alkaline Phosphatase: 105 U/L (ref 39–117)
BUN: 16 mg/dL (ref 6–23)
CO2: 23 mEq/L (ref 19–32)
Calcium: 10.7 mg/dL — ABNORMAL HIGH (ref 8.4–10.5)
Chloride: 109 mEq/L (ref 96–112)
Creatinine, Ser: 1.17 mg/dL (ref 0.4–1.2)
GFR calc Af Amer: 56 mL/min — ABNORMAL LOW (ref >60)
GFR calc non Af Amer: 46 mL/min — ABNORMAL LOW (ref >60)
Glucose, Bld: 89 mg/dL (ref 70–99)
Potassium: 4 mEq/L (ref 3.5–5.1)
Sodium: 141 mEq/L (ref 135–145)
Total Bilirubin: 0.2 mg/dL — ABNORMAL LOW (ref 0.3–1.2)
Total Protein: 7.2 g/dL (ref 6.0–8.3)

## 2010-12-30 LAB — SURGICAL PCR SCREEN
MRSA, PCR: NEGATIVE
Staphylococcus aureus: NEGATIVE

## 2010-12-30 LAB — TYPE AND SCREEN
ABO/RH(D): O POS
Antibody Screen: NEGATIVE

## 2010-12-30 LAB — APTT: aPTT: 30 seconds (ref 24–37)

## 2010-12-30 LAB — ABO/RH: ABO/RH(D): O POS

## 2010-12-31 ENCOUNTER — Ambulatory Visit: Payer: Self-pay | Admitting: Vascular Surgery

## 2011-01-20 LAB — POCT I-STAT, CHEM 8
BUN: 19 mg/dL (ref 6–23)
Calcium, Ion: 1.18 mmol/L (ref 1.12–1.32)
Chloride: 107 mEq/L (ref 96–112)
Creatinine, Ser: 1.3 mg/dL — ABNORMAL HIGH (ref 0.4–1.2)
Glucose, Bld: 94 mg/dL (ref 70–99)
HCT: 33 % — ABNORMAL LOW (ref 36.0–46.0)
Hemoglobin: 11.2 g/dL — ABNORMAL LOW (ref 12.0–15.0)
Potassium: 4.5 mEq/L (ref 3.5–5.1)
Sodium: 138 mEq/L (ref 135–145)
TCO2: 23 mmol/L (ref 0–100)

## 2011-01-26 LAB — DIFFERENTIAL
Basophils Absolute: 0.2 10*3/uL — ABNORMAL HIGH (ref 0.0–0.1)
Basophils Relative: 2 % — ABNORMAL HIGH (ref 0–1)
Eosinophils Absolute: 0.5 10*3/uL (ref 0.0–0.7)
Eosinophils Relative: 5 % (ref 0–5)
Lymphocytes Relative: 28 % (ref 12–46)
Lymphs Abs: 2.9 10*3/uL (ref 0.7–4.0)
Monocytes Absolute: 0.8 10*3/uL (ref 0.1–1.0)
Monocytes Relative: 7 % (ref 3–12)
Neutro Abs: 6.1 10*3/uL (ref 1.7–7.7)
Neutrophils Relative %: 59 % (ref 43–77)

## 2011-01-26 LAB — URINE MICROSCOPIC-ADD ON

## 2011-01-26 LAB — TSH: TSH: 0.49 u[IU]/mL (ref 0.350–4.500)

## 2011-01-26 LAB — URINALYSIS, ROUTINE W REFLEX MICROSCOPIC
Bilirubin Urine: NEGATIVE
Glucose, UA: NEGATIVE mg/dL
Hgb urine dipstick: NEGATIVE
Ketones, ur: NEGATIVE mg/dL
Leukocytes, UA: NEGATIVE
Nitrite: NEGATIVE
Protein, ur: 30 mg/dL — AB
Specific Gravity, Urine: 1.009 (ref 1.005–1.030)
Urobilinogen, UA: 1 mg/dL (ref 0.0–1.0)
pH: 6 (ref 5.0–8.0)

## 2011-01-26 LAB — BASIC METABOLIC PANEL
BUN: 18 mg/dL (ref 6–23)
CO2: 23 mEq/L (ref 19–32)
Calcium: 10.2 mg/dL (ref 8.4–10.5)
Chloride: 106 mEq/L (ref 96–112)
Creatinine, Ser: 1.22 mg/dL — ABNORMAL HIGH (ref 0.4–1.2)
GFR calc Af Amer: 54 mL/min — ABNORMAL LOW (ref >60)
GFR calc non Af Amer: 44 mL/min — ABNORMAL LOW (ref >60)
Glucose, Bld: 120 mg/dL — ABNORMAL HIGH (ref 70–99)
Potassium: 4.2 mEq/L (ref 3.5–5.1)
Sodium: 140 mEq/L (ref 135–145)

## 2011-01-26 LAB — CBC
HCT: 35.7 % — ABNORMAL LOW (ref 36.0–46.0)
Hemoglobin: 11.9 g/dL — ABNORMAL LOW (ref 12.0–15.0)
MCHC: 33.2 g/dL (ref 30.0–36.0)
MCV: 89.3 fL (ref 78.0–100.0)
Platelets: 507 10*3/uL — ABNORMAL HIGH (ref 150–400)
RBC: 4 MIL/uL (ref 3.87–5.11)
RDW: 15.1 % (ref 11.5–15.5)
WBC: 10.4 10*3/uL (ref 4.0–10.5)

## 2011-01-26 LAB — POCT CARDIAC MARKERS
CKMB, poc: 1.5 ng/mL (ref 1.0–8.0)
Myoglobin, poc: 120 ng/mL (ref 12–200)
Troponin i, poc: <0.05 ng/mL (ref 0.00–0.09)

## 2011-01-27 LAB — DIFFERENTIAL
Basophils Absolute: 0.1 10*3/uL (ref 0.0–0.1)
Basophils Relative: 1 % (ref 0–1)
Eosinophils Absolute: 0.2 10*3/uL (ref 0.0–0.7)
Eosinophils Relative: 3 % (ref 0–5)
Lymphocytes Relative: 28 % (ref 12–46)
Lymphs Abs: 2.2 10*3/uL (ref 0.7–4.0)
Monocytes Absolute: 0.7 10*3/uL (ref 0.1–1.0)
Monocytes Relative: 9 % (ref 3–12)
Neutro Abs: 4.7 10*3/uL (ref 1.7–7.7)
Neutrophils Relative %: 60 % (ref 43–77)

## 2011-01-27 LAB — BASIC METABOLIC PANEL
BUN: 16 mg/dL (ref 6–23)
CO2: 23 mEq/L (ref 19–32)
Calcium: 9.4 mg/dL (ref 8.4–10.5)
Chloride: 102 mEq/L (ref 96–112)
Creatinine, Ser: 1.54 mg/dL — ABNORMAL HIGH (ref 0.4–1.2)
GFR calc Af Amer: 41 mL/min — ABNORMAL LOW (ref >60)
GFR calc non Af Amer: 34 mL/min — ABNORMAL LOW (ref >60)
Glucose, Bld: 176 mg/dL — ABNORMAL HIGH (ref 70–99)
Potassium: 4.9 mEq/L (ref 3.5–5.1)
Sodium: 137 mEq/L (ref 135–145)

## 2011-01-27 LAB — URINALYSIS, ROUTINE W REFLEX MICROSCOPIC
Bilirubin Urine: NEGATIVE
Glucose, UA: NEGATIVE mg/dL
Hgb urine dipstick: NEGATIVE
Ketones, ur: NEGATIVE mg/dL
Leukocytes, UA: NEGATIVE
Nitrite: NEGATIVE
Protein, ur: 30 mg/dL — AB
Specific Gravity, Urine: 1.013 (ref 1.005–1.030)
Urobilinogen, UA: 0.2 mg/dL (ref 0.0–1.0)
pH: 6.5 (ref 5.0–8.0)

## 2011-01-27 LAB — CBC
HCT: 34.8 % — ABNORMAL LOW (ref 36.0–46.0)
Hemoglobin: 11.8 g/dL — ABNORMAL LOW (ref 12.0–15.0)
MCHC: 34 g/dL (ref 30.0–36.0)
MCV: 89 fL (ref 78.0–100.0)
Platelets: 438 10*3/uL — ABNORMAL HIGH (ref 150–400)
RBC: 3.91 MIL/uL (ref 3.87–5.11)
RDW: 14.8 % (ref 11.5–15.5)
WBC: 7.8 10*3/uL (ref 4.0–10.5)

## 2011-01-27 LAB — URINE MICROSCOPIC-ADD ON

## 2011-01-27 LAB — GLUCOSE, CAPILLARY: Glucose-Capillary: 171 mg/dL — ABNORMAL HIGH (ref 70–99)

## 2011-01-27 LAB — CK: Total CK: 96 U/L (ref 7–177)

## 2011-01-28 LAB — URINALYSIS, ROUTINE W REFLEX MICROSCOPIC
Bilirubin Urine: NEGATIVE
Bilirubin Urine: NEGATIVE
Glucose, UA: NEGATIVE mg/dL
Glucose, UA: NEGATIVE mg/dL
Hgb urine dipstick: NEGATIVE
Hgb urine dipstick: NEGATIVE
Ketones, ur: NEGATIVE mg/dL
Ketones, ur: NEGATIVE mg/dL
Nitrite: NEGATIVE
Nitrite: NEGATIVE
Protein, ur: NEGATIVE mg/dL
Protein, ur: NEGATIVE mg/dL
Specific Gravity, Urine: 1.012 (ref 1.005–1.030)
Specific Gravity, Urine: 1.015 (ref 1.005–1.030)
Urobilinogen, UA: 1 mg/dL (ref 0.0–1.0)
Urobilinogen, UA: 0.2 mg/dL (ref 0.0–1.0)
pH: 6.5 (ref 5.0–8.0)
pH: 6 (ref 5.0–8.0)

## 2011-01-28 LAB — COMPREHENSIVE METABOLIC PANEL
ALT: 11 U/L (ref 0–35)
ALT: 11 U/L (ref 0–35)
AST: 15 U/L (ref 0–37)
AST: 15 U/L (ref 0–37)
Albumin: 3.4 g/dL — ABNORMAL LOW (ref 3.5–5.2)
Albumin: 3.8 g/dL (ref 3.5–5.2)
Alkaline Phosphatase: 104 U/L (ref 39–117)
Alkaline Phosphatase: 106 U/L (ref 39–117)
BUN: 23 mg/dL (ref 6–23)
BUN: 13 mg/dL (ref 6–23)
CO2: 22 mEq/L (ref 19–32)
CO2: 28 mEq/L (ref 19–32)
Calcium: 9.6 mg/dL (ref 8.4–10.5)
Calcium: 10.1 mg/dL (ref 8.4–10.5)
Chloride: 107 mEq/L (ref 96–112)
Chloride: 103 mEq/L (ref 96–112)
Creatinine, Ser: 1.27 mg/dL — ABNORMAL HIGH (ref 0.4–1.2)
Creatinine, Ser: 1.03 mg/dL (ref 0.4–1.2)
GFR calc Af Amer: 51 mL/min — ABNORMAL LOW (ref >60)
GFR calc Af Amer: >60 mL/min (ref >60)
GFR calc non Af Amer: 42 mL/min — ABNORMAL LOW (ref >60)
GFR calc non Af Amer: 54 mL/min — ABNORMAL LOW (ref >60)
Glucose, Bld: 112 mg/dL — ABNORMAL HIGH (ref 70–99)
Glucose, Bld: 102 mg/dL — ABNORMAL HIGH (ref 70–99)
Potassium: 4.7 mEq/L (ref 3.5–5.1)
Potassium: 4.4 mEq/L (ref 3.5–5.1)
Sodium: 139 mEq/L (ref 135–145)
Sodium: 137 mEq/L (ref 135–145)
Total Bilirubin: 0.5 mg/dL (ref 0.3–1.2)
Total Bilirubin: 0.4 mg/dL (ref 0.3–1.2)
Total Protein: 6.8 g/dL (ref 6.0–8.3)
Total Protein: 7.4 g/dL (ref 6.0–8.3)

## 2011-01-28 LAB — CBC
HCT: 33.2 % — ABNORMAL LOW (ref 36.0–46.0)
HCT: 34.7 % — ABNORMAL LOW (ref 36.0–46.0)
Hemoglobin: 11.4 g/dL — ABNORMAL LOW (ref 12.0–15.0)
Hemoglobin: 11.3 g/dL — ABNORMAL LOW (ref 12.0–15.0)
MCHC: 34.5 g/dL (ref 30.0–36.0)
MCHC: 32.6 g/dL (ref 30.0–36.0)
MCV: 89.2 fL (ref 78.0–100.0)
MCV: 91.1 fL (ref 78.0–100.0)
Platelets: 420 10*3/uL — ABNORMAL HIGH (ref 150–400)
Platelets: 360 10*3/uL (ref 150–400)
RBC: 3.72 MIL/uL — ABNORMAL LOW (ref 3.87–5.11)
RBC: 3.81 MIL/uL — ABNORMAL LOW (ref 3.87–5.11)
RDW: 14.5 % (ref 11.5–15.5)
RDW: 15.6 % — ABNORMAL HIGH (ref 11.5–15.5)
WBC: 10 10*3/uL (ref 4.0–10.5)
WBC: 10.5 10*3/uL (ref 4.0–10.5)

## 2011-01-28 LAB — DIFFERENTIAL
Basophils Absolute: 0 10*3/uL (ref 0.0–0.1)
Basophils Relative: 1 % (ref 0–1)
Eosinophils Absolute: 0.2 10*3/uL (ref 0.0–0.7)
Eosinophils Relative: 2 % (ref 0–5)
Lymphocytes Relative: 21 % (ref 12–46)
Lymphs Abs: 2.1 10*3/uL (ref 0.7–4.0)
Monocytes Absolute: 0.6 10*3/uL (ref 0.1–1.0)
Monocytes Relative: 6 % (ref 3–12)
Neutro Abs: 7.1 10*3/uL (ref 1.7–7.7)
Neutrophils Relative %: 71 % (ref 43–77)

## 2011-01-28 LAB — LIPASE, BLOOD: Lipase: 12 U/L (ref 11–59)

## 2011-02-02 LAB — DIFFERENTIAL
Basophils Absolute: 0.1 10*3/uL (ref 0.0–0.1)
Basophils Relative: 1 % (ref 0–1)
Eosinophils Absolute: 0.5 10*3/uL (ref 0.0–0.7)
Eosinophils Relative: 4 % (ref 0–5)
Lymphocytes Relative: 28 % (ref 12–46)
Lymphs Abs: 3 10*3/uL (ref 0.7–4.0)
Monocytes Absolute: 0.8 10*3/uL (ref 0.1–1.0)
Monocytes Relative: 8 % (ref 3–12)
Neutro Abs: 6.3 10*3/uL (ref 1.7–7.7)
Neutrophils Relative %: 59 % (ref 43–77)

## 2011-02-02 LAB — COMPREHENSIVE METABOLIC PANEL
ALT: 14 U/L (ref 0–35)
AST: 18 U/L (ref 0–37)
Albumin: 4.3 g/dL (ref 3.5–5.2)
Alkaline Phosphatase: 103 U/L (ref 39–117)
BUN: 18 mg/dL (ref 6–23)
CO2: 22 mEq/L (ref 19–32)
Calcium: 10.3 mg/dL (ref 8.4–10.5)
Chloride: 108 mEq/L (ref 96–112)
Creatinine, Ser: 1.13 mg/dL (ref 0.4–1.2)
GFR calc Af Amer: 58 mL/min — ABNORMAL LOW (ref >60)
GFR calc non Af Amer: 48 mL/min — ABNORMAL LOW (ref >60)
Glucose, Bld: 122 mg/dL — ABNORMAL HIGH (ref 70–99)
Potassium: 4.3 mEq/L (ref 3.5–5.1)
Sodium: 140 mEq/L (ref 135–145)
Total Bilirubin: 0.5 mg/dL (ref 0.3–1.2)
Total Protein: 7.2 g/dL (ref 6.0–8.3)

## 2011-02-02 LAB — URINE MICROSCOPIC-ADD ON

## 2011-02-02 LAB — URINALYSIS, ROUTINE W REFLEX MICROSCOPIC
Bilirubin Urine: NEGATIVE
Glucose, UA: NEGATIVE mg/dL
Hgb urine dipstick: NEGATIVE
Ketones, ur: NEGATIVE mg/dL
Nitrite: NEGATIVE
Protein, ur: NEGATIVE mg/dL
Specific Gravity, Urine: 1.014 (ref 1.005–1.030)
Urobilinogen, UA: 0.2 mg/dL (ref 0.0–1.0)
pH: 5.5 (ref 5.0–8.0)

## 2011-02-02 LAB — CBC
HCT: 36 % (ref 36.0–46.0)
Hemoglobin: 11.9 g/dL — ABNORMAL LOW (ref 12.0–15.0)
MCHC: 33.1 g/dL (ref 30.0–36.0)
MCV: 91.1 fL (ref 78.0–100.0)
Platelets: 412 10*3/uL — ABNORMAL HIGH (ref 150–400)
RBC: 3.96 MIL/uL (ref 3.87–5.11)
RDW: 16.6 % — ABNORMAL HIGH (ref 11.5–15.5)
WBC: 10.6 10*3/uL — ABNORMAL HIGH (ref 4.0–10.5)

## 2011-03-02 NOTE — Assessment & Plan Note (Signed)
OFFICE VISIT   [name], [name]  DOB:  1940/10/29                                       09/24/2010  [id number]   [name] returns for follow-up today.  She underwent left to right  femoral-femoral bypass on August 31, 2010.  She returns for  postoperative follow-up today.  She states the claudication symptoms in  her right leg have significantly improved.  She still has some  occasional pain in the right groin incision but the left groin incision  is pain free at this point.  She has had no drainage from the incisions.  She has had no fevers.   PHYSICAL EXAMINATION:  Blood pressure is 157/83 in the left arm, heart  rate 69 and regular.  Temperature is 97.4.  Lower extremity exam:  She has 2+ femoral pulses in both groins.  The  femoral pulse on the right side was previously diminished, this is  significantly improved.  She has absent pedal pulses on the right side  but has a 2+ left dorsalis pedis pulse.  Incisions are all well-healed.   She had bilateral ABIs today which were 1.02 on the right, improved from  the 0.6 range, 0.93 on the left.  Waveforms were biphasic to monophasic  bilaterally.   Overall [name] is doing well.  She will follow up with [place] again  with a duplex of her graft in 1 year's time.  She will follow up sooner  she has any problems.     Jessy Oto. Fields, MD  Electronically Signed   CEF/MEDQ  D:  09/24/2010  T:  09/25/2010  Job:  [id number]

## 2011-03-02 NOTE — H&P (Signed)
Alyssa [name], Alyssa [name]          ACCOUNT NO.:  [id number]   MEDICAL RECORD NO.:  [id number]          PATIENT TYPE:  AMB   LOCATION:  [place]                           FACILITY:  [facility]   PHYSICIAN:  Eli Hose, M.D.DATE OF BIRTH:  11/15/1940   DATE OF ADMISSION:  DATE OF DISCHARGE:                              HISTORY & PHYSICAL   HISTORY OF PRESENT ILLNESS:  Alyssa [name] is a 70 year old female, para  2-0-0-2, who presents for dilatation and curettage.  She will also have  hysteroscopy.  The patient has postmenopausal bleeding.  An endometrial  biopsy was performed that showed benign elements.  The patient reports  having monthly periods in spite of her age.  The patient has a history  of osteoporosis.  Her most recent Pap smear was within normal limits.  Her gonorrhea and chlamydia cultures were negative.  Her urine culture  was negative.  The patient did have trichomoniasis and was appropriately  treated.   DRUG ALLERGIES:  No known drug allergies.   PAST MEDICAL HISTORY:  The patient is status post tubal ligation.  She  has a history of hypertension and she is currently being treated by her  family physician.   MEDICATIONS:  Exforge.   OBSTETRICAL HISTORY:  The patient has had 2 term vaginal deliveries.   SOCIAL HISTORY:  The patient smokes one-half pack of cigarettes each  day.  She drinks alcohol socially.  She denies other recreational drug  uses.   REVIEW OF SYSTEMS:  Noncontributory.   FAMILY HISTORY:  Noncontributory.   PHYSICAL EXAMINATION:  VITAL SIGNS:  Weight is 106 pounds.  HEENT:  Within normal limits.  CHEST:  Clear.  HEART:  Regular rate and rhythm.  BREASTS:  Without masses.  ABDOMEN:  Nontender.  EXTREMITIES:  Grossly normal.  NEUROLOGIC:  Grossly normal.  PELVIC:  External genitalia is normal.  The vagina is normal except for  atrophic changes.  The cervix is nontender, but it bleeds with Pap.  Uterus is upper limits of normal size.  Adnexa, no  masses, and  rectovaginal exam confirms.   ASSESSMENT:  Postmenopausal bleeding.   PLAN:  The patient will undergo hysteroscopy with dilatation and  curettage.  She understands the indications for her surgical procedure  and she accepts the risks of, but not limited to, anesthetic  complications, bleeding, infection, and possible damage to the  surrounding organs.      Eli Hose, M.D.  Electronically Signed     AVS/MEDQ  D:  12/15/2008  T:  12/15/2008  Job:  [id number]   cc:   Ardyth Gal. Spruill, M.D.  Fax: [phone]

## 2011-03-02 NOTE — Discharge Summary (Signed)
Alyssa [name], Alyssa [name]          ACCOUNT NO.:  [id number]   MEDICAL RECORD NO.:  [id number]          PATIENT TYPE:  IPS   LOCATION:  0306                          FACILITY:  [facility]   PHYSICIAN:  Stark Jock, M.D. DATE OF BIRTH:  04/21/1941   DATE OF ADMISSION:  02/26/2009  DATE OF DISCHARGE:  02/28/2009                               DISCHARGE SUMMARY   IDENTIFYING INFORMATION:  This is a 70 year old single African American  female who was admitted on a voluntary basis on Feb 26, 2009.   HISTORY OF PRESENT ILLNESS:  The patient is having history of panic  attacks.  She states she was confused.  She presents with escalating  symptoms including hopelessness, racing thoughts, and crying spells.  Stressors include a possible lupus diagnosis last week.  She reports  being confused most of the time.  She also reports daily panic attacks  and decreased appetite and decreased sleep.  She has passive thoughts of  dying.  For further admission information, see psychiatric admission  assessment.   PHYSICAL FINDINGS:  There were no acute physical or medical problems  noted.  There were no significant findings or acute medical or physical  problems noted.   ADMISSION LABORATORIES:  CT scan of the head was negative.  Urinalysis  was negative.  Creatinine was 1.22.  Hematocrit was 11.9 and hematocrit  was 35.7.   HOSPITAL COURSE:  Upon admission, the patient was started on Ativan 0.5  mg p.o. q.6 h. p.r.n. anxiety or agitation.  She was also started on her  home medications of Darvocet-N 100 one tablet p.o. 4-6 hours and Ambien  10 mg p.o. q.h.s. and a nicotine patch 21 mg daily.  She was also  started on her home medications of Exforge 10/320 one pill daily.  A TSH  was ordered, which was within normal limits.  In individual sessions,  the patient was casually dressed with fair eye contact.  There was  psychomotor retardation.  Speech was soft and slow.  She was depressed  and anxious with  consistent affect.  Thought processes were logical and  goal-directed.  There was no evidence of psychosis.  The patient was  started on Celexa 10 mg q.h.s. and 20 mg q.h.s. as of Feb 28, 2009.  On  Feb 28, 2009, the patient was feeling much better.  She had a family  session with her fiance, whom she lives with.  He stated her anxiety  started after the appointment where the doctor stated she might have  lupus.  They gave 1 more specimen before this was determined.  The  patient admits she started worrying and thought about being in a nursing  home.  She stated that she did want to be dependent on her family.  Celesta Gentile stated she did not need to worry about these things because  nothing had happened and she had a lot of support, if it did.  The  patient's sleep had improved.  Appetite was good.  Her mood was less  depressed, less anxious.  Affect was consistent with mood.  There was no  suicidal or homicidal ideation.  No thoughts of self-injurious behavior.  No auditory or visual hallucinations.  No paranoia or delusions.  Thoughts were logical and goal-directed.  Thought content, no  predominant theme.  Cognitive was grossly intact.  Insight good.  Judgment good.  Impulse control good.  It was felt the patient was safe  for discharge today and she wanted to go home.   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  Axis II: None.  Axis III: Hypertension, migraine headaches, osteoporosis.  Axis IV: Severe (worry about medical problems, burden of psychiatric  illness, problems with primary support group, other psychosocial  problems).  Axis V: Global assessment of functioning was 50 upon discharge.  GAF was  35 on admission.  GAF highest past year was 65-70.   DISCHARGE PLAN:  There were no specific activity level or dietary  restriction.   POSTHOSPITAL CARE PLANS:  The patient will be seen at the [facility] for followup  medication management and counseling on  Mar 04, 2009, at 2:30 p.m.   DISCHARGE MEDICATIONS:  1. Celexa 20 mg daily.  2. Exforge 10/320 one daily.  3. Boniva as directed.  4. Pain medications as directed (Darvocet).  5. Ambien 10 mg at bedtime.      Stark Jock, M.D.  Electronically Signed     BHS/MEDQ  D:  02/28/2009  T:  03/01/2009  Job:  [id number]

## 2011-03-02 NOTE — Assessment & Plan Note (Signed)
OFFICE VISIT   Aina, Chelci A  DOB:  29-Aug-1941                                       06/18/2010  [id number]   HISTORY OF PRESENT ILLNESS:  The patient is a 70 year old female who  returns for followup today.  She was last seen in January 2011 for lower  extremity pain.  Her pain was thought to be multifactorial in nature at  that time with some element of PAD as well as neuropathy.  She was  placed on Neurontin at that time.  She returns now for a 75-month  followup.   She continues to describe a burning and tingling sensation in her feet.  She states that the right calf tightens after walking 1 block.  She  continues to deny any rest pain.  She has no history of diabetes.  Unfortunately, despite counseling at her last office visit and greater  than 3 minutes at this office visit she continues to smoke greater than  a half-pack of cigarettes per day.   CHRONIC MEDICAL PROBLEMS:  Hypertension and elevated cholesterol, both  which are followed by Dr. Montez Morita, uncontrolled.   REVIEW OF SYSTEMS:  She denies any shortness of breath or chest pain.   PHYSICAL EXAMINATION:  Exam blood pressure is 129/80 in the left arm,  heart rate is 77 and regular, respirations 20.  HEENT:  Unremarkable.  Neck has 2+ carotid pulses without bruit.  Chest is clear to  auscultation.  Cardiac exam is regular rate and rhythm without murmur.  Abdomen is soft, nontender, nondistended.  No masses.  Extremities:  She  has no major joint deformities.  She has 2+ radial pulses bilaterally.  She has an absent right femoral pulse.  She has a 2+ left femoral pulse.  She has absent popliteal and pedal pulses bilaterally.  Her right leg is  approximately 2 inches shorter than the left, which is chronic.  Skin  has no open ulcers or rashes.   She had bilateral ABIs performed today which were 0.67 on the right,  0.96 on the left.  Waveforms were biphasic on the left, monophasic  on  the right.  These are essentially unchanged from January 2011.  I  discussed with the patient today again that smoking cessation was  paramount to improving her symptoms and also preserving her extremities  long-term.  I also discussed with her the possibility of intervention to  improve her lower extremity circulation.  However, she currently wishes  conservative management with walking and smoking cessation.  She will  return for followup in 6 months' time or sooner if she wishes an  intervention.     Jessy Oto. Fields, MD  Electronically Signed   CEF/MEDQ  D:  06/19/2010  T:  06/19/2010  Job:  [id number]   cc:   Ardyth Gal. Spruill, M.D.

## 2011-03-02 NOTE — Assessment & Plan Note (Signed)
OFFICE VISIT   [name], Alyssa A  DOB:  September 09, 1941                                       10/29/2009  [id number]   CHIEF COMPLAINT:  Both legs hurt.   HISTORY OF PRESENT ILLNESS:  The patient is a 70 year old female  complaining of pain in both of her legs that occurs from the calf down  to the foot.  She states that this can occur with either sitting or  walking.  She states both legs are equally effected.  She states that  sometimes the pain is actually improved with walking.  This has been  going on several months.  She also has a history of occasional lower  back pain.  Sometimes the pain radiates from the right side of her back  down her right leg.  She also has bilateral numbness and tingling in  both feet.  She denies any prior leg operations.  However, she has had  four prior hip replacements, the most recent of these was 3 years ago.  She does not describe classic claudication symptoms such as tightening  in her calf at a certain walking distance.   Primary atherosclerotic risk factor is tobacco abuse.  She currently  smokes a half pack of cigarettes per day.  Greater than 3 minutes today  were spent regarding smoking cessation counseling.  She was given a  brochure from the [place] [facility] smoking cessation clinic as well as  discussions with trying nicotine patches or nicotine gum.   CHRONIC MEDICAL PROBLEMS:  Include anxiety attacks, elevated  cholesterol, hypertension, all of which are currently controlled.   FAMILY HISTORY:  Unremarkable.   SOCIAL HISTORY:  She is single, has two children.  Smokes a half pack of  cigarettes per day.  She does not consume alcohol regularly.   REVIEW OF SYSTEMS:  A full 12 point review of systems was performed by  the patient.  Please see intake referral form for details regarding  this.   PHYSICAL EXAM:  Vital signs:  Blood pressure is 169/89 in the right arm,  heart rate is 89 and regular.   Temperature is 97.6.  HEENT:  Unremarkable.  Neck:  Has 2+ carotid pulses without bruit.  Chest:  Clear to auscultation.  Cardiac:  Exam is regular rate and rhythm.  Abdomen:  Soft, nontender, nondistended.  No masses.  Extremities:  She  has 2+ femoral pulses bilaterally.  She has a 2+ left popliteal pulse.  She has absent pedal pulses bilaterally.  She has an absent right  popliteal pulse.  Feet are pink, warm and well-perfused bilaterally.  There are no ulcerations on the feet.  Neurological:  Exam shows  slightly decreased sensation to light touch in both feet.  This extends  up to the mid leg bilaterally.  Skin:  Has no ulceration or rashes.  Musculoskeletal:  Exam is remarkable for the right leg being  approximately 4 inches shorter than the left.   MEDICATIONS CURRENTLY:  1. Include Boniva 150 mg monthly.  2. Citalopram 2 mg once a day.  3. Lorazepam 1 mg t.i.d. as needed for anxiety.  4. Crestor 10 mg once a day.  5. Exforge 10/320 once daily.   She had bilateral ABIs performed today which were 0.96 on the left with  triphasic waveforms and 0.67 on the right with biphasic  waveforms.   I believe the patient's pain in her lower extremities is multifactorial  in nature.  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  I did  inform her today that she was not at risk of limb loss currently and  that an ABI of greater than 0.6 should be sufficient for her right lower  extremity.  However, this may be contributing to some of her symptoms.  Additionally, the fact that her right limb is 4 inches shorter than her  left also puts considerable stress on her back and legs bilaterally.  I  am sure that this contributes to her leg pain somewhat as well.  Her  ABIs on the left side  are essentially normal so again her blood vessels  do not seem to completely explain all of her pain symptoms.  I believe  the best option for her for now is an attempt at smoking cessation as  well as continued walking of 30 minutes daily to try to improve her  endurance in her lower extremities.  I have also started her on  Neurontin today to see if she can have some improvement of the numbness  and tingling symptoms in her lower extremities.  She will follow up in  six months' time for repeat ABIs.     Jessy Oto. Fields, MD  Electronically Signed   CEF/MEDQ  D:  10/29/2009  T:  10/30/2009  Job:  [id number]   cc:   Ardyth Gal. Spruill, M.D.

## 2011-03-02 NOTE — Op Note (Signed)
NAMEJENNARAE, Alyssa [name]          ACCOUNT NO.:  [id number]   MEDICAL RECORD NO.:  [id number]          PATIENT TYPE:  AMB   LOCATION:  [place]                           FACILITY:  [facility]   PHYSICIAN:  Eli Hose, M.D.DATE OF BIRTH:  24-May-1941   DATE OF PROCEDURE:  12/19/2008  DATE OF DISCHARGE:                               OPERATIVE REPORT   PREOPERATIVE DIAGNOSES:  1. Postmenopausal bleeding.  2. Anemia (hemoglobin 11.3).   POSTOPERATIVE DIAGNOSES:  1. Postmenopausal bleeding.  2. Anemia (hemoglobin 11.3).  3. Lesion on posterior cervix.  4. Uterine perforation.   PROCEDURES:  1. Hysteroscopy.  2. Dilatation and curettage.  3. Cervical biopsy at 6 o'clock.   SURGEON:  Eli Hose, MD   FIRST ASSISTANT:  None.   ANESTHETIC:  General.   DISPOSITION:  [name] is a 70 year old female, para 2-0-0-2, who  presents with the above-mentioned diagnoses.  She understands the  indications for her surgical procedure and she accepts the risks of, but  not limited to, anesthetic complications, bleeding, infections, and  possible damage to the surrounding organs.   FINDINGS:  The uterus sounded to 7 cm.  No lesions were noted on the  endometrium.  There was a raised lesions that appeared fibrotic at the 6  o'clock position on the cervix that measured approximately 0.5 cm.  On  examination under anesthesia, no masses were appreciated.  There was a  uterine perforation, but no apparent harm and no bleeding.   PROCEDURE IN DETAIL:  The patient was taken to the operating room where  general anesthetic was given.  The patient's lower abdomen, perineum,  and vagina were prepped with multiple layers of Betadine.  The bladder  was drained of urine.  The patient was sterilely draped.  Examination  under anesthesia was performed.  A paracervical block was placed using  10 mL of 0.5% Marcaine with epinephrine.  An endocervical curettage was  then performed.  The uterus  initially sounded to 11 cm and a perforation  was suspected.  We then redirected the sound and the uterus sounded to 7  cm.  The diagnostic hysteroscope was inserted and the perforation was  confirmed.  We were able to carefully inspect the endometrial cavity,  and no lesions were appreciated.  There was no bleeding from the  perforation site.  We then gently curetted the uterine cavity, and there  was essentially no tissue present.  Hemostasis was again confirmed.  We  injected 2 mL of 0.5% Marcaine with epinephrine into the fibrotic lesion  at the 6 o'clock position on the cervix.  A biopsy was taken.  Hemostasis was confirmed at the biopsy site.  At this point, we felt  that our procedure was completed.  All instruments were removed.  The  patient was examined and again no masses were appreciated, and the small  uterus was noted to be firm.  Sponge, needle, and instrument counts were  correct.  The estimated blood loss for the procedure was less than 10  mL.  The estimated fluid deficit was 231 mL of sorbitol, although there  was fluid noted  on the operating room floor.  The patient was awakened  from her anesthetic without difficulty.  She was transported to the  recovery room in stable condition.  The endocervical curettage, the  endometrial biopsy, and the cervical biopsy were all sent to Pathology  for evaluation.   FOLLOWUP INSTRUCTIONS:  The patient was given Vicodin and she will take  1 tablet every 4-6 hours as needed for severe pain.  She will take  Tylenol 1 or 2 tablets every 6 hours as needed for mild-to-moderate  pain.  She can take Zofran 4 mg every 4-6 hours as needed for nausea.  She will call for questions or concerns.  She was given a copy of the  postoperative instruction sheet as prepared by the [facility] for patients who have undergone a dilatation and curettage.  The uterine perforation will be explained to the patient and to her  family  members prior to discharge.       Eli Hose, M.D.  Electronically Signed     AVS/MEDQ  D:  12/19/2008  T:  12/19/2008  Job:  [id number]   cc:   Ardyth Gal. Spruill, M.D.  Fax: [phone]

## 2011-03-02 NOTE — Op Note (Signed)
NAMESANYA, Alyssa [name]          ACCOUNT NO.:  [id number]   MEDICAL RECORD NO.:  [id number]          PATIENT TYPE:  AMB   LOCATION:  [place]                          FACILITY:  [facility]   PHYSICIAN:  Jefry H. Constance Holster, MD     DATE OF BIRTH:  [birth year]   DATE OF PROCEDURE:  04/26/2007  DATE OF DISCHARGE:                               OPERATIVE REPORT   PREOPERATIVE DIAGNOSES:  1. Nasal septal deviation.  2. Inferior turbinate hypertrophy.  3. Chronic right maxillary sinusitis.  4. Chronic right ethmoid sinusitis.   POSTOPERATIVE DIAGNOSES:  1. Nasal septal deviation.  2. Inferior turbinate hypertrophy.  3. Chronic right maxillary sinusitis.  4. Chronic right ethmoid sinusitis.   PROCEDURE:  1. Nasoseptoplasty.  2. Submucous resection inferior turbinates bilaterally.  3. Right endoscopic anterior ethmoidectomy.  4. Right endoscopic maxillary antrostomy.   SURGEON:  Jefry H. Constance Holster, MD.   General endotracheal anesthesia was used.   No complications.   BLOOD LOSS:  Minimal.   FINDINGS:  Severe deviation of the septum with an S shaped deformity  caused by excessive length of the quadrangular cartilage and multiple  bony spurs of the ethmoid plate and the vomer.  The inferior turbinates  were very large with thin, elongated bony enlargement.  The right  maxillary sinus contained thickened, swollen mucosa with thick  inspissated secretions and the anterior ethmoid complex contained  hypoplastic mucosa.  No complications.   HISTORY:  A 70 year old with history of chronic sinusitis and chronic  nasal obstruction.  She has been treated with aggressive medical therapy  but has failed to clear the chronic infection.  Risks, benefits,  alternatives, complications of the procedure were explained to the  patient who seemed to understand and agreed to the surgery.   PROCEDURE IN DETAIL:  The patient was taken to the operating room and  placed on the operating table in the supine position.   Following  induction of general endotracheal anesthesia, the face was prepped and  draped in a standard fashion.  Afrin spray was used preoperatively in  the nasal cavities.  Xylocaine 1% with epinephrine was infiltrated into  the septum, the columella, the inferior turbinates and the superior and  posterior attachments of the middle turbinate on the right.  Afrin  soaked pledgets were then placed bilaterally.   1  Septoplasty:  A left hemitransfixion incision was created using 15  scalpel and a mucosal flap was developed down the left side to the  sphenoid rostrum.  Bony cartilaginous junction was divided and a similar  flap was developed down the right side.  The ethmoid plate was taken  down using open Jensen-Middleton rongeur and Takahashi forceps were used  to resect the remainder of the ethmoid plate and the vomerine bone which  is causing the majority of the posterior deflection.  The maxillary  crest was curved towards the left side.  This was also taken down to the  floor of the nose using a small osteotome.  The posterior aspect of the  quadrangular cartilage was resected relieving much of the obstruction on  the right side.  The remaining  cartilage was kept intact to prevent  postoperative loss of support but multiple scoring incisions were  created on the left side to help to undo a bowing towards the right  side.  The mucosal incision was reapproximated with chromic suture.  The  septal flaps were quilted with plain gut.  At the termination of the  procedure, the nasal cavities were packed with rolled up Telfa coated  with bacitracin.  1. Submucous resection inferior turbinates bilaterally:  The leading      edge of the inferior turbinates was incised in a vertical fashion      using a 15 scalpel.  Caudal elevator was used to elevate mucosa off      bone in all directions and fragments of bone were resected.  The      turbinate remnants were outfractured with the elevator.   This      provided nice inferior meatus airways bilaterally.  2. Right endoscopic anterior ethmoidectomy:  Using the 0 and 30 degree      nasal endoscopes, the infundibulum was inspected.  The uncinate      process was severely elongated and was taken down at its base using      a sickle knife.  The ethmoid bowl was exposed and was dissected      using the microdebrider.  A complete anterior ethmoidectomy was      performed up to the fovea and laterally to the lamina papyracea      which was kept intact.  The frontal recess was opened and it was a      very large opening into the frontal sinus.  There was no disease      present in the frontal.  There were multiple cells with      hyperplastic mucosa.  The dissection was carried down to the      granular lamella which was kept intact.  3. Right endoscopic maxillary antrostomy:  After bulla dissection, the      30-degree endoscope and curved suction was used to inspect the      maxillary ostium.  The ostium was enlarged using backbiting forceps      and straight through cut forceps.  The sinus itself contained      thickened mucosa and very thick inspissated mucus which was all      suctioned out.  After adequate cleaning was performed, the nasal      cavities, sinus cavities and pharynx were suctioned of blood and      secretions.  The packing was placed and the patient was then      awakened, extubated and transferred to recovery.      Jefry H. Constance Holster, MD  Electronically Signed     JHR/MEDQ  D:  04/26/2007  T:  04/26/2007  Job:  [id number]

## 2011-03-05 NOTE — Op Note (Signed)
[place]. [facility]  Patient:    [name], Alyssa [name] Visit Number: [id number] MRN: [id number]          Service Type: DSU Location: [facility] [id number] Attending Physician:  Mayme Genta Dictated by:   Sharmon Revere, M.D. Proc. Date: 06/07/01 Adm. Date:  06/07/2001                             Operative Report  PREOPERATIVE DIAGNOSIS:  Loose bipolar right hip, osteolysis right hip, rule out infection.  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  ANESTHESIA:  General.  PROCEDURE:  Aspiration right hip, cultures and gram stain done.  DESCRIPTION:  Patient was taken to the operating room after given adequate preoperative medication, was given general anesthesia and intubated.  Her hip was prepped with DuraPrep and draped in sterile manner.  C-arm used for placement of the needle for aspiration.  Aspiration of the joint bone revealed ______ material ______.  This was placed under area from anaerobic cultures and gram strain done.  Sterile saline 40 cc was injected into the joint and repeat aspiration attempted ______ of saline was possible to be obtained but enough to culture ______ .  Puncture wounds were covered with Band-Aids and patient tolerated the procedure quite well and went to recovery room in stable and satisfactory condition.  DISPOSITION:  Patient is being discharged home on Cipro 250 one b.i.d. x 5 days, ______ for pain and to return to the office ______.  Patient is being discharged in stable and satisfactory condition. Dictated by:   Sharmon Revere, M.D. Attending Physician:  Mayme Genta DD:  06/07/01 TD:  06/07/01 Job: [id number]

## 2011-03-05 NOTE — H&P (Signed)
NAME:  Alyssa [name], Alyssa [name]                    ACCOUNT NO.:  [id number]   MEDICAL RECORD NO.:  [id number]                   PATIENT TYPE:  OIB   LOCATION:  2899                                 FACILITY:  [facility]   PHYSICIAN:  Marily Memos, M.D.                 DATE OF BIRTH:  1941/09/17   DATE OF ADMISSION:  06/03/2004  DATE OF DISCHARGE:                                HISTORY & PHYSICAL   CHIEF COMPLAINT:  Painful catching of right knee.   HISTORY OF PRESENT ILLNESS:  This is a 70 year old female who has been  followed in the office for internal derangement of her right knee with  recurrent pain, swelling and catching in the right knee.  The patient had  therapeutic injection, warm compresses and anti-inflammatories.  The patient  did fairly well for a while, but nonetheless, a few weeks, the patient's  pain has progressively worsened with pain on even just weightbearing as well  as nonweightbearing.   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  No history of high blood pressure or diabetes.   ALLERGIES:  None known.   MEDICATIONS:  Mobic, Percocet and Skelaxin.   REVIEW OF SYSTEMS:  History of constipation, nocturia and frequency.  No  cardiac symptoms.   FAMILY HISTORY:  Noncontributory.   SOCIAL HISTORY:  The patient denies the use of alcohol, smokes 1 pack per  day x15 years.  No history of use of illegal drugs.   PHYSICAL EXAMINATION:  VITAL SIGNS:  Temperature 98.2, pulse of 93,  respirations 20, blood pressure 146/98.  Height 61 inches.  Weight 101.  HEAD:  Normocephalic.  EYES:  Conjunctivae and sclerae clear.  NECK:  Supple.  CHEST:  Clear.  CARDIAC:  S1 and S2 regular.  EXTREMITIES:  Right knee:  Tender medial and patellofemoral joint with  palpable and audible click on McMurray's test, +2 effusion.  Range of motion  if full.  Negative drawer and negative Lachman's test.   IMPRESSION:  Internal derangement of  right knee.   PLAN:  Plan is that of arthroscopy, right knee.                                                Marily Memos, M.D.    AC/MEDQ  D:  06/03/2004  T:  06/03/2004  Job:  [id number]

## 2011-03-05 NOTE — Discharge Summary (Signed)
NAME:  ANNALI, VERGEL                    ACCOUNT NO.:  [id number]   MEDICAL RECORD NO.:  [id number]                   PATIENT TYPE:  INP   LOCATION:  5014                                 FACILITY:  [facility]   PHYSICIAN:  Marily Memos, M.D.                 DATE OF BIRTH:  December 19, 1940   DATE OF ADMISSION:  12/07/2003  DATE OF DISCHARGE:  12/10/2003                                 DISCHARGE SUMMARY   ADMITTING DIAGNOSIS:  Fractured distal right femur.   DISCHARGE DIAGNOSIS:  Fractured distal right femur.   COMPLICATIONS:  None.   INFECTIONS:  None.   OPERATIONS:  Open reduction, internal fixation, right distal femur.   PERTINENT HISTORY:  This is a 70 year old female who has undergone recently  a total hip revision.  The patient after her revision developed dislocation  which required open reduction as well as initial previous closed  manipulation.  The patient began to have distal femoral pain on discharge  from the hospital after she had gotten home.  The patient came back to the  hospital, had x-rays done, and was found to have a fractured distal femur.   Pertinent physical reveals right knee tenderness, swelling, right hip range  of motion is good, both passive and active.  X-ray revealed fractured distal  femur at the tip end of the prosthesis.  The patient has preoperative  laboratories done, CBC, CMET, UA, which were found to be stable for the  patient to undergo surgery.  The patient underwent ORIF with supracondylar  90 degree plate and screw fixation.   The patient tolerated the procedure quite well.  The pain was brought under  control.  She was started back to physical therapy and nonweightbearing on  the right side with the use of a walker.   DISCHARGE INSTRUCTIONS:  1. The patient remained afebrile and was able to be discharged home with     continue home therapy, continued on Coumadin.  2. The patient is toe-down weightbearing on the right side.  3. Return to  the office in 10 days.  4. Pain control with the use of Percocet.   DISCHARGE CONDITION:  The patient was discharged in stable and satisfactory  condition.                                                Marily Memos, M.D.    AC/MEDQ  D:  01/15/2004  T:  01/16/2004  Job:  [id number]

## 2011-03-05 NOTE — H&P (Signed)
[place]. [facility]  Patient:    Alyssa [name], Alyssa [name] Visit Number: [id number] MRN: [id number]          Service Type: SUR Location: [facility] Attending Physician:  Mayme Genta Dictated by:   Sharmon Revere, M.D. Admit Date:  10/25/2001 Discharge Date: 10/26/2001                           History and Physical  CHIEF COMPLAINT:  Drainage from right thigh.  HISTORY OF PRESENT ILLNESS:  This is a 70 year old female who had undergone right total hip revision with bone grafting approximately six weeks ago. Patient came to the emergency room complaining of some drainage coming from the scar on her right thigh.  PAST MEDICAL HISTORY:  Right total hip revision with bone grafting and history of high blood pressure with diabetes.  ALLERGIES:  None.  MEDICATIONS:  Percocet and Lodine.  PHYSICAL EXAMINATION:  VITAL SIGNS:  Temperature 98.6, pulse 85, respirations 20, blood pressure 145/78.  GENERAL:  Alert and oriented, in no acute distress.  HEENT:  Normocephalic.  Eyes:  Conjunctivae and sclerae clear.  NECK:  Supple.  CHEST:  Clear.  EXTREMITIES:  Right hip:  Soft tissue swelling about the right distal thigh area, old surgical scar well-healed, a little pinpoint opening with serosanguineous fluid.  IMPRESSION:  Hematoma, right thigh. Dictated by:   Sharmon Revere, M.D. Attending Physician:  Mayme Genta DD:  11/22/01 TD:  11/22/01 Job: [id number]

## 2011-03-05 NOTE — Discharge Summary (Signed)
NAME:  Alyssa [name], Alyssa [name]                    ACCOUNT NO.:  [id number]   MEDICAL RECORD NO.:  [id number]                   PATIENT TYPE:  INP   LOCATION:  5007                                 FACILITY:  [facility]   PHYSICIAN:  Marily Memos, M.D.                 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   COMPLICATIONS:  None.   INFECTIONS:  None.   OPERATION:  Right total hip revision of acetabular femoral component with  use of bone graft, clotting factors, and bone stimulating factors.   PERTINENT HISTORY:  This is a 70 year old female who had had a previous  right total hip revision with bone grafting of the acetabulum, as well as  the femoral canal with bone strips used about the femoral component and bone  grafting into the acetabulum two years ago.  The patient had done quite well  up until the past two or three months when she began to develop pain and  disability with ambulation.  The patient had pain with rest, as well as  ambulation.  X-rays revealed that the screws in the acetabular component had  broken and the acetabular component had rotated.  In addition to the bone  graft had completely dissolved in the acetabulum, but had a good take of the  bone graft on the femoral component.   PERTINENT PHYSICAL:  An antalgic gait with shortening on the right side.  Limited range of motion with pain and discomfort of the right side.  X-rays  revealed broken screws in the acetabular component with rotation of the  acetabular component.   HOSPITAL COURSE:  The patient had preoperative laboratory done, CMET,  urinalysis, CBC, EKG, and chest x-ray.  The patient was found to be stable  to undergo surgery.  The patient underwent right  total hip revision with  acetabulum and femoral component with use of bone stimulating factors, the  patient's own serum, as well as the patient clotting factors.  The patient  had bone grafting to the acetabulum by way of Dupuy technique and tolerated  the procedure quite well.  Her postoperative course was fairly benign.  The  patient was placed on IV antibiotics, Coumadin, physical therapy, and  nonweightbearing on the right side.  The patient remained afebrile.  The  wound was healing well.  The patient was scheduled to be discharged on  December 03, 2003.  The patient was given enema the night before and turned  on her left side with her knee flexed up and the patient's right hip  dislocated.  The patient was taken back to the operating room on December 03, 2003, and underwent attempted closed reduction, which was unsuccessful  and had to have open reduction and change of the femoral head and neck  component to a +5 mm.  The patient's postoperative course after the open  reduction was fairly benign.  The patient was placed in a hip abduction  brace also and continued with physical therapy, nonweightbearing on that  side.  She was able to be discharged on Coumadin at home, home health  physical therapy at home, and Percocet for pain control.  The patient was  discharged in satisfactory and stable condition.                                                Marily Memos, M.D.    AC/MEDQ  D:  12/26/2003  T:  12/28/2003  Job:  [id number]

## 2011-03-05 NOTE — Op Note (Signed)
Alyssa [name], Alyssa [name]          ACCOUNT NO.:  [id number]   MEDICAL RECORD NO.:  [id number]          PATIENT TYPE:  OIB   LOCATION:  NA                           FACILITY:  [facility]   PHYSICIAN:  Marily Memos, MD      DATE OF BIRTH:  04-03-1941   DATE OF PROCEDURE:  11/03/2004  DATE OF DISCHARGE:                                 OPERATIVE REPORT   PREOPERATIVE DIAGNOSIS:  Fracture, third and fourth metacarpals, left hand.   POSTOPERATIVE DIAGNOSIS:  Fracture, third and fourth metacarpals, left hand.   PROCEDURE:  Open reduction and internal fixation of third metacarpal, left  hand.   ANESTHESIA:  General.   DESCRIPTION OF PROCEDURE:  The patient was taken to the operating room after  given adequate preoperative medication, given general anesthesia and  intubated.  Left hand and arm were prepped and draped in a sterile manner.  Tourniquet and Bovie used in hemostasis.  Mini C-arm was used to visualize  screw placement and fracture reduction.  A longitudinal incision was made  over the third metacarpal, through skin and subcutaneous tissue.  Soft  tissue elevation was done, bluntly and sharply, over the fracture site.  Collagen was already formed into the fracture site.  This was removed with  the use of curets.  This allowed the length of the third metacarpal to be  regained.  Manipulator reduction was done with the anatomic reduction.  This  was held with bone clamp.  A six-hole mini plate was used to stabilize the  fracture site with the use of five screws.  Copious irrigation was then  done.  Wound closure was then done with the use of 4-0 nylon in the skin,  compressive dressing and cock-up wrist splint.  The patient tolerated the  procedure quite well and went to the recovery room in stable and  satisfactory condition.   The patient is being discharged home with a sling, ice pack and Percocet 5  mg q.6 p.r.n. for pain, to return to the office in one week.  The patient is  being discharged in stable and satisfactory condition.      AC/MEDQ  D:  11/03/2004  T:  11/03/2004  Job:  [id number]

## 2011-03-05 NOTE — Op Note (Signed)
[place]. [facility]  Patient:    NORELI, FREYMAN Visit Number: [id number] MRN: [id number]          Service Type: SUR Location: [place] 01 Attending Physician:  Mayme Genta Dictated by:   Sharmon Revere, M.D. Proc. Date: 10/25/01 Admit Date:  10/25/2001 Discharge Date: 10/26/2001                             Operative Report  PREOPERATIVE DIAGNOSIS:  Dislocation, right total hip revision.  POSTOPERATIVE DIAGNOSIS:  Dislocation, right total hip revision.  PROCEDURE:  Closed manipulated reduction under general anesthesia.  SURGEON:  Sharmon Revere, M.D.  ANESTHESIA:  General.  DESCRIPTION OF PROCEDURE:  Patient was taken to the operating room after giving adequate preop medication and given general anesthesia and intubated. C-arm was used to visualize reduction.  Gentle traction was applied to the right hip and the right hip was reduced.  With the use of C-arm, there was no evidence of any distraction of the proximal or distal components and bone graft held by cable wires was still in place.  Patient was placed in hip abduction pillow.  Patient tolerated procedure quite well and went to recovery room in stable and satisfactory condition. Dictated by:   Sharmon Revere, M.D. Attending Physician:  Mayme Genta DD:  11/22/01 TD:  11/22/01 Job: [id number]

## 2011-03-05 NOTE — H&P (Signed)
[place]. [facility]  Patient:    Alyssa [name], Alyssa [name] Visit Number: [id number] MRN: [id number]          Service Type: SUR Location: [employer] 01 Attending Physician:  Mayme Genta Dictated by:   Sharmon Revere, M.D. Admit Date:  10/25/2001 Discharge Date: 10/26/2001                           History and Physical  CHIEF COMPLAINT:  Painful right hip.  HISTORY OF PRESENT ILLNESS:  This is a 70 year old female who states that she was getting up from a sitting position and developed severe pain in her right hip.  Patient is status post right total hip revision approximately eight weeks ago.  Patient was brought to [facility] Emergency Room.  PAST MEDICAL HISTORY:  Hemorrhoidectomy and multiple right hip operations and more recent right total hip revision.  No history of high blood pressure or diabetes.  HABITS:  Patient drinks beer.  ALLERGIES:  None known.  MEDICATIONS:  Percocet.  FAMILY HISTORY:  Noncontributory.  REVIEW OF SYSTEMS:  Basically that of history of present illness.  PHYSICAL EXAMINATION:  VITAL SIGNS:  Temperature 98.1, pulse 68, respirations 16, blood pressure 159/91.  Height 5 feet 3 inches.  Weight 106 pounds.  HEENT:  Normocephalic.  Eyes:  Conjunctivae and sclerae clear.  NECK:  Supple.  CHEST:  Clear.  CARDIAC:  S1 and S2 regular.  EXTREMITIES:  Right hip internally rotated, flexed at the knee and hip area, tender to palpation, old surgical scar well-healed.  LABORATORY AND ACCESSORY DATA:  X-ray revealed dislocation, right total hip revision. Dictated by:   Sharmon Revere, M.D. Attending Physician:  Mayme Genta DD:  11/22/01 TD:  11/22/01 Job: [id number]

## 2011-03-05 NOTE — H&P (Signed)
[place]. [facility]  Patient:    Alyssa [name], Alyssa [name] Visit Number: [id number] MRN: [id number]          Service Type: DSU Location: [facility] [id number] Attending Physician:  Mayme Genta Dictated by:   Sharmon Revere, M.D. Adm. Date:  06/07/2001                           History and Physical  CHIEF COMPLAINT:  Painful shortening, right hip.  HISTORY OF PRESENT ILLNESS:  This is a 70 year old female who presents to the office with progressive worsening of right hip pain as well as shortening of her right lower extremity.  Patient had a recent fall and had been having increased worsening of pain since.  Patient gives a history of having initially right hip surgery 20 years ago and received approximately 16 operations to her right hip.  Her last operation was 10 years ago with a bipolar hip implant.  PAST MEDICAL HISTORY:  Is that of history of present illness.  ALLERGIES:  None known.  MEDICATIONS:  Anti-inflammatory, Decadron 4 mg b.i.d. x 2 days followed by Lodine 400 mg b.i.d.  HABITS:  None.  FAMILY HISTORY:  Noncontributory.  PHYSICAL EXAMINATION:  VITAL SIGNS:  Temperature 99.4, pulse 87, respirations 14, blood pressure 154/103.  Height 5 feet 3 inches, weight 110.  HEENT:  Normocephalic, atraumatic.  Sclerae clear.  NECK:  Supple.  CHEST:  Clear.  ABDOMEN:  Soft, active bowel sounds.  EXTREMITIES:  Right hip with old surgical scar anteriorly and laterally. Shortening of 2.5 inches on the right side.  Limited range of motion.  Pain on rotation as well as attempted flexion of the right hip.  Obvious gluteal lurch with shortening of the right side in ambulation.  LABORATORY DATA:  X-rays at the office reveal osteolysis with loosening of bipolar implant with dissolving of the trochanter as well as the femoral head component protruding into the pelvis.  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  IMPRESSION:  Loose  right bipolar implant, synovitis, and osteolysis right hip, rule out infection. Dictated by:   Sharmon Revere, M.D. Attending Physician:  Mayme Genta DD:  06/07/01 TD:  06/07/01 Job: [id number]

## 2011-03-05 NOTE — Op Note (Signed)
NAMEKIRSTINA, BAGDONAS NO.:  [id number]   MEDICAL RECORD NO.:  [id number]          PATIENT TYPE:  OIB   LOCATION:  2864                         FACILITY:  [facility]   PHYSICIAN:  Marily Memos, MD      DATE OF BIRTH:  18-Jul-1941   DATE OF PROCEDURE:  08/27/2004  DATE OF DISCHARGE:  08/27/2004                                 OPERATIVE REPORT   PREOPERATIVE DIAGNOSIS:  Broken retained hardware, right distal femur,  supracondylar plate with screws.   POSTOPERATIVE DIAGNOSIS:  Broken retained hardware, right distal femur,  supracondylar plate with screws.   OPERATION PERFORMED:  Removal of broken hardware, right distal femur.   SURGEON:  Marily Memos, MD   ANESTHESIA:  General.   DESCRIPTION OF PROCEDURE:  The patient was taken to the operating room after  given adequate preoperative medication, given general anesthesia and  intubated.  Right lower extremity was prepped with DuraPrep and draped in a  sterile manner.  Tourniquet and Bovie used for hemostasis.  Lateral incision  was made going through the old previous scar, going through the skin and  subcutaneous tissue down to the fascia lata, down to the supracondylar 90  degree compression plate.  Four screws were identified and removed.  The  plate itself was then removed.  A separate incision made medially to get to  the distal broken screw fragment.  Medial incision going through skin and  subcutaneous tissue down to the fascia and muscle was made.  The tip of the  screw was covered with bone.  Osteotome was used to remove the covering  bone, reverse screw set was used to ream and remove the broken fragment.  Copious irrigation was then done.  Wound closure was then done using 0  Vicryl for the fascia, 2-0 for the subcutaneous and skin staples for the  skin.  Compressive dressing was applied.  Knee immobilizer applied.  The  patient tolerated the procedure quite well and went to recovery room in  stable and  satisfactory condition.  The patient is being discharged to home  on Percocet 10/650, continue her previous home medication, ice packs,  nonweightbearing on the right side with the use of her crutches or walker  that she has at home.  Return to the office in one week.  Patient is being  discharged in stable and satisfactory condition.      AC/MEDQ  D:  08/27/2004  T:  08/28/2004  Job:  [id number]

## 2011-03-05 NOTE — Op Note (Signed)
NAME:  Alyssa [name], Alyssa [name]                    ACCOUNT NO.:  [id number]   MEDICAL RECORD NO.:  [id number]                   PATIENT TYPE:  OIB   LOCATION:  2899                                 FACILITY:  [facility]   PHYSICIAN:  Marily Memos, M.D.                 DATE OF BIRTH:  [birth year]   DATE OF PROCEDURE:  06/03/2004  DATE OF DISCHARGE:                                 OPERATIVE REPORT   PREOPERATIVE DIAGNOSIS:  Internal derangement of right knee.   POSTOPERATIVE DIAGNOSES:  1. Medial femoral condylar defect.  2. Patellar plica with synovitis.   PROCEDURE:  1. Synovectomy with excision of plica.  2. Chondroplasty, medial femoral condyle.   SURGEON:  Marily Memos, M.D.   ANESTHESIA:  General.   DESCRIPTION OF PROCEDURE:  The patient was taken to the operating room after  giving adequate preop medication, given general anesthesia and intubated.  The right knee was prepped with Duraprep and draped in a sterile manner.  A  tourniquet was used for hemostasis.  Inspection of the knee revealed  tremendously overgrown plica along the anterior compartment of the knee;  this was completely resected.  There was a medial femoral condylar blowout  lesion approximately the size of a 25-cent piece over the medial femoral  condyle.  Chondroplasty and removal of loose fragments were then done with  smoothing of the surface.  Complete resection of the defect was done.  ACL  was intact.  Lateral meniscus was intact, as well as the medial meniscus.  Copious irrigation was done.  Wound closure was then done with the use of 4-  0 nylon.  Ten milliliters of 0.25% Marcaine were injected into the knee.  Compressive dressing was applied.  The patient tolerated the procedure quite  well and went to recovery room in stable and satisfactory condition.   The patient is being discharged home on crutches, nonweightbearing on the  right side, to return to the office in 1 week.  The patient is being  discharged on Percocet 10/650 mg q.6 h. p.r.n. and continue her previous  medication, ice packs, elevation.  The patient is being discharged in stable  and satisfactory condition.                                               Marily Memos, M.D.    AC/MEDQ  D:  06/03/2004  T:  06/03/2004  Job:  [id number]

## 2011-03-05 NOTE — Discharge Summary (Signed)
[place]. [facility]  Patient:    Alyssa [name], Alyssa [name] Visit Number: [id number] MRN: [id number]          Service Type: SUR Location: [place] 01 Attending Physician:  Mayme Genta Dictated by:   Sharmon Revere, M.D. Admit Date:  10/25/2001 Discharge Date: 10/26/2001                             Discharge Summary  ADMITTING DIAGNOSIS:  Dislocation of right total hip revision.  DISCHARGE DIAGNOSIS:  Dislocation of right total hip revision.  COMPLICATIONS:  None.  INFECTIONS:  None.  OPERATION:  Closed manipulative reduction under anesthesia of right total hip.  PERTINENT HISTORY:  This is a 70 year old who had presented to the office with a complaint of right hip pain and x-rays at the office revealed dislocation of the right total hip.  PHYSICAL EXAMINATION:  Pertinent physical was that of the right hip internally rotated and shortened, tender to palpation, old surgical scar well-healed.  HOSPITAL COURSE:  Patient underwent closed manipulative reduction under general anesthesia, tolerated procedure quite well and was able to be discharged home in hip abduction brace with the use of a walker and to return to the office in a two-week period.  CONDITION ON DISCHARGE:  Patient was discharged in stable and satisfactory condition. Dictated by:   Sharmon Revere, M.D. Attending Physician:  Mayme Genta DD:  11/22/01 TD:  11/22/01 Job: [id number]

## 2011-03-05 NOTE — Discharge Summary (Signed)
[place]. [facility]  Patient:    Alyssa [name], HAMAKER Visit Number: [id number] MRN: [id number]          Service Type: [facility] Location: [id number] Attending Physician:  Gilmore Laroche Dictated by:   Lavon Paganini. [place], P.A. Admit Date:  09/11/2001 Disc. Date: 09/16/01   CC:         Sharmon Revere, M.D.  Ardyth Gal. Spruill, M.D.   Discharge Summary  DISCHARGE DIAGNOSES: 1. Right hip revision, September 07, 2001. 2. Anemia. 3. Coumadin for deep venous thrombosis prophylaxis. 4. Constipation, resolved. 5. Tobacco abuse.  HISTORY OF PRESENT ILLNESS:  70 year old black female, admitted September 07, 2001, with history of a right total hip replacement and multiple hip surgeries, who presented with increased right hip pain and x-rays with loosened components.  She underwent right hip revision on September 07, 2001, per Dr. Marily Memos and placed on Coumadin for deep vein thrombosis prophylaxis and advised to be nonweight-bearing.  Postoperative anemia and transfused.  No chest pain or shortness of breath.  Required supervision for ambulation.  Latest INR 2.7, hemoglobin 10.3.  Admitted for comprehensive rehab program.  PAST MEDICAL HISTORY:  Benign.  PAST SURGICAL HISTORY:  Right total hip replacement and hemorrhoid surgery.  ALLERGIES:  None.  HABITS:  She does have a history of tobacco abuse.  Denies alcohol.  PRIMARY PHYSICIAN:  Her primary M.D. is Dr. Wallene Huh.  MEDICATIONS PRIOR TO ADMISSION:  Vicodin.  SOCIAL HISTORY:  She lives alone in [place], independent with a cane prior to admission.  She does not drive.  She is retired.  One-level home with four steps to entry.  Son in area and works.  HOSPITAL COURSE:  Patient did well while in rehabilitation services with therapies initiated on a b.i.d. basis.  The following issues were followed during patients rehab course.  Pertaining to Ms. Lynam right hip revision, it  remained stable and surgical site is healing nicely.  Staples remained intact.  She would follow up with Dr. Eulas Post at his office, of which she would call for an appointment for removal of clips.  She was advised to call Dr. Eulas Post if any increased redness, drainage, or fever.  She was ambulating with a walker household distances, nonweight-bearing.  She was modified independence for activities of daily living of dressing, grooming, and homemaking.  Planned discharge home with home health therapy for [place].  She continued on Coumadin for deep vein thrombosis prophylaxis.  Latest INR 1.8.  Arville Go will continue to follow to complete Coumadin protocol which would end on December 25.  She received a venous Doppler of the lower extremities that showed no signs of deep vein thrombosis.  She did have a bout of constipation resolved with laxative assistance.  She has an ongoing history of tobacco abuse.  She was placed on a Nicoderm patch and tapered accordingly.  She had no voiding difficulties.  LABORATORY DATA:  Latest labs showed an INR of 1.8, hemoglobin 9.4, hematocrit 27.6, sodium 135, potassium 4.2, BUN 9, creatinine 0.8.  DISCHARGE MEDICATIONS: 1. Coumadin 1 mg one-half tablet alternating with one tablet daily to begin on    September 16, 2001.  Coumadin to be completed on October 11, 2001. 2. Nicoderm patch, taper accordingly. 3. Tylox as needed for pain.  DISCHARGE INSTRUCTIONS:  ACTIVITY:  Nonweight-bearing right leg with hip precautions.  WOUND CARE:  Follow up with Dr. Eulas Post September 19, 2001, for removal of staples.  Call if any increased  redness, drainage, or fever.  FOLLOWUP:  [facility] from prothrombin time on September 20, 2001.  She should follow up with Dr. Montez Morita, [phone], medical management. Dictated by:   Lavon Paganini. [place], P.A. Attending Physician:  Gilmore Laroche DD:  09/15/01 TD:  09/15/01 Job: [id number]

## 2011-03-05 NOTE — Discharge Summary (Signed)
[place]. [facility]  Patient:    Alyssa [name], Alyssa [name] Visit Number: [id number] MRN: [id number]          Service Type: EXP Location: [place] Attending Physician:  Maudry Diego Dictated by:   Sharmon Revere, M.D. Admit Date:  09/22/2001 Discharge Date: 09/22/2001                             Discharge Summary  ADMITTING DIAGNOSIS:  Painful loose left total hip, osteolysis, and acetabular protrusio.  DISCHARGE DIAGNOSIS:  Painful loose left total hip, osteolysis, and acetabular protrusio.  INFECTIONS:  None.  OPERATION:  Left total hip revision with bone graft, both acetabulum and femur.  COMPLICATIONS:  None.  PERTINENT HISTORY:  This is a 70 year old who has had multiple operations on her left hip over the past 10-15 years.  Patient presently has had severe pain and weakness in the left hip, difficulty ambulating.  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  Patient had previous hip aspiration which was negative for any bacteria.  PERTINENT HOSPITAL COURSE:  Patient had preoperative laboratory done, was found to be stable enough for patient to undergo surgery.  Patient underwent left total hip revision with bone grafting.  Postoperative course was fairly benign.  Progressed fairly well with physical therapy, well enough to be transferred to rehab, and was continued on Coumadin therapy.  Had a discharge to rehab in stable and satisfactory condition.  Patient was to be seen back in the office one week after discharge from rehab. Dictated by:   Sharmon Revere, M.D. Attending Physician:  Maudry Diego DD:  09/27/01 TD:  09/27/01 Job: [id number]

## 2011-03-05 NOTE — Op Note (Signed)
NAME:  Alyssa [name], Alyssa [name]                    ACCOUNT NO.:  [id number]   MEDICAL RECORD NO.:  [id number]                   PATIENT TYPE:  INP   LOCATION:  5007                                 FACILITY:  [facility]   PHYSICIAN:  Marily Memos, M.D.                 DATE OF BIRTH:  1941/09/21   DATE OF PROCEDURE:  12/03/2003  DATE OF DISCHARGE:                                 OPERATIVE REPORT   PREOPERATIVE DIAGNOSIS:  Dislocation right total hip.   POSTOPERATIVE DIAGNOSIS:  Dislocation right total hip.   PROCEDURE:  1. Attempt closed manipulative reduction right total hip.  2. Open reduction right dislocated hip and replacement of 1.5 femoral head     with a +5 femoral head 36 mm.   SURGEON:  Marily Memos, M.D.   DESCRIPTION OF PROCEDURE:  The patient was taken to the operating room and  given adequate preoperative medications.  She was given general anesthesia  and intubated.  Attempts at reduction of the right hip was done first in the  supine position and then in the right lateral position with the hip able to  be reduced.  The right hip was then scrubbed with Betadine scrub and painted  with DuraPrep, draped in a sterile manner.  The C-arm was used.  The  incision was reopened.  Irrigation with antibiotic solution was done.  Irrigation with antibiotics solution was done.  Hematoma was removed.  Inspection of the hip, acetabulum and femoral component is good and still  stable, in good position.  The hip was taken to a flexed and internal  rotation position and reduced.  Tugging on the femur, this showed some  telescoping on internal rotation and abduction, the hip would dislocate.  The 1.5 femoral head was then removed.  Trial for +5 and +8 mm heads were  put in place.  The 8 mm was too tight.  The 5 mm demonstrated good stability  internal and external rotation without any _________ and tugging.  This was  put in place.  Copious irrigation was done with antibiotic solution  followed  by wound closure, #1 Vicryl for the fascia, 0 for the fascia and  subcutaneous and skin staples for the skin.  A compressive dressing was  applied.  The patient was placed in hip abduction pillow.  She tolerated the  procedure quite well and went to the recovery room in stable and  satisfactory condition.  The patient will be placed in hip abduction brace  before discharge.                                               Marily Memos, M.D.    AC/MEDQ  D:  12/03/2003  T:  12/03/2003  Job:  [id number]

## 2011-03-05 NOTE — Op Note (Signed)
NAMETAKECIA, [name]          ACCOUNT NO.:  [id number]   MEDICAL RECORD NO.:  [id number]          PATIENT TYPE:  AMB   LOCATION:  ENDO                         [facility]AC[facility]:  [facility]   PHYSICIAN:  Wonda Horner, M.D.   DATE OF BIRTH:  [birth year]   DATE OF PROCEDURE:  03/29/2005  DATE OF DISCHARGE:                                 OPERATIVE REPORT   PROCEDURE:  Incomplete colonoscopy.   INDICATIONS FOR PROCEDURE:  Chronic constipation screening.   Informed consent was obtained after explanation of the risks of bleeding,  infection, and perforation.   PREMEDICATION:  1.  Fentanyl 62.5 mcg IV.  2.  Versed 6 mg IV.   PROCEDURE:  With the patient in the left lateral decubitus position, a  rectal exam was performed, and no masses were felt.  The Olympus pediatric  adjustable colonoscope was inserted into the rectum and advanced into the  sigmoid colon.  The sigmoid showed diverticula.  The sigmoid was very  tortuous and fixed.  I could not advance the scope out of the sigmoid colon.  I could not get the scope to go beyond 25 cm.  Attempts at placing the  patient on her back to try to help maneuver the scope were also  unsuccessful.  I could not advance the scope beyond 25 cm.  Other than  diverticula in the sigmoid, no abnormalities were seen.  She tolerated the  procedure well without obvious complications.   IMPRESSION:  Diverticulosis of the sigmoid colon.  The exam was incomplete.  I will schedule her for an air-contrast barium enema tomorrow.       SFG/MEDQ  D:  03/29/2005  T:  03/29/2005  Job:  [id number]

## 2011-03-05 NOTE — Op Note (Signed)
NAME:  Alyssa [name], Alyssa [name]                    ACCOUNT NO.:  [id number]   MEDICAL RECORD NO.:  [id number]                   PATIENT TYPE:  INP   LOCATION:  5007                                 FACILITY:  [facility]   PHYSICIAN:  Marily Memos, M.D.                 DATE OF BIRTH:  09/01/1941   DATE OF PROCEDURE:  11/28/2003  DATE OF DISCHARGE:  12/06/2003                                 OPERATIVE REPORT   PREOPERATIVE DIAGNOSIS:  Loose right total hip implant.   POSTOPERATIVE DIAGNOSIS:  Loose right total hip implant, both femoral and  acetabular components.   ANESTHESIA:  General.   PROCEDURE:  Right total hip revision, both femoral and acetabular components  with use of bone graft.   The patient was taken to the operating room after given adequate  preoperative medication, given general anesthesia and intubated.  The  patient was placed in the right lateral position.  The right hip was prepped  with Duraprep and draped in a sterile manner.  Bovie used for hemostasis.  Posterior southern approach was made over the right hip going through the  previous scar, going through the skin, subcutaneous tissue, down to the  fascia lata.  The capsule was identified, dissected.  The hip was  dislocated.  Femoral head component was removed.  Poly was removed from the  acetabulum, two screws were broken, were found and identified, and three  screws were removed.  Cultures were done of the right hip joint, aerobic and  anaerobic along with Gram stain.   Next, attempt at removal of the femoral component was made with the use of  __________ malleable and flexible osteotomes.  A femoral component was  released and was released anteriorly, posteriorly and laterally.  There was  some good bone purchase into the femoral component.  After removal of the  femoral component, reaming of the acetabulum was done to 66 mm to good bone  surface, bleeding surface.  Acetabular trial component was then placed and  was felt to seat into the acetabulum very well.  Next attention was turned  to the femoral component, femoral stem, sharp piercing reamer placed down  the canal to break through the cortical floor, followed by progressive  reaming of the femoral canal.  Previous bone graft of the femur held quite  well.  Two of the cable wires were still intact and were left.  Reaming of  the femoral canal was done up to a 12-14 cone, which was a 13.5 porous-  coated stem.   After adequate reaming, rasp was in place, trial femoral head and neck  component was put in place, as well as acetabulum lining.  Acetabular liner  was a +4, 10-degree, 36 mm, and the acetabular cup itself was 66 mm.  These  components appeared to give patient good flexion in internal and external  rotation without telescoping and good stability.  Next, bone chips,  allograft with  patient's serum centrifuged for bone growing factors and  stimulating factors were mixed.  Bone graft was placed into the acetabulum  initially and pressed into some of the previous defects where the screws  were.  This was further impacted with an impactor as well as with a reverse  reamer.  Bone graft covering the surface was found to be very good.  The  final acetabular cup was 66 mm multi-hole with a __________ mm, +4, 10-  degree liner.  Three screws were used and had good bone purchase.  Acetabulum was found to be good and stable.  The femoral stem was then  hammered in place.  Fully porous-coated 32.5 mm which had good tight fit and  seated well with the calcar.  The 1.5 36 mm head and neck component was put  in place, hammered in place.  Hip was reduced.  Patient had good flexion and  extension in internal and external rotation without telescoping and was good  and stable.  Bone producing factors were sprayed both over the acetabulum as  well as the femoral canal.  After the hip was reduced, the clotting factors  from the patient's serum were then  sprayed and placed over the wound site.  The wound was closed with 1-0 Vicryl for the fascia, 2-0 for the  subcutaneous and  skin staples for the skin.  The patient did receive two units of packed  cells during the procedure.  The patient had compressive dressing applied,  placed on a hip abduction pillow.  The patient tolerated the procedure quite  well and went to the recovery room in stable and satisfactory condition.                                               Marily Memos, M.D.    AC/MEDQ  D:  12/26/2003  T:  12/27/2003  Job:  [id number]

## 2011-03-05 NOTE — Op Note (Signed)
[place]  Patient:    ADAR, FERDON Visit Number: [id number] MRN: [id number]          Service Type: EXP Location: [facility] Attending Physician:  Maudry Diego Dictated by:   Sharmon Revere, M.D. Proc. 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  ANESTHESIA:  General.  PROCEDURE:  Right total hip revision with use of Biomet total hip system and with use of bone graft to the acetabulum and bone struts to the femoral stem.  DESCRIPTION:  Patient was taken to the operating room after given adequate preoperative medication, given general anesthesia and intubated.  The patient was placed in the right lateral position.  The right hip was prepped with DuraPrep and draped in a sterile manner.  A posterior ______ approach was made over the right hip using part of the old previous scar, going through the skin and subcutaneous tissue down to the gluteal and fascial tissue.  Hip was taken up into a flexed position and internally rotated.  The capsule was identified, completely resected.  The head component was locked onto the stem. The hip itself was difficult to dislocate, but after dislocating the hip after removal of the capsule and the soft tissue about the area, the femoral stem still had medial and posterior attachment to the femur itself.  With the use of flexible osteotomes most of this was able to be loosened, but due to a distal attachment the femur itself was osteotomized and split open.  This allowed retrieval of the implant.  Inspection of the acetabulum revealed a buildup of synovial tissue, which was completely resected all the way down to the bony surface  itself.  After adequate soft tissue resection both about the femur and the acetabulum, femoral head was cut with an oscillating saw to fill the acetabulum with bone graft.  The remainder of the femoral head was morcellized. Reaming was further done into the cap of the femoral head to allow the acetabular component to fit into it.  This bone strut was packed into the acetabulum and the morcellized other bone graft was packed around the edge, filing the acetabulum up fairly well.  Trial acetabular components were put in place with a pretty good fit.  The acetabulum was filled with bone graft and the sizing of the acetabulum was done, which was found to be 58 mm with the ______ porous-coated.  The acetabulum was stabilized with three acetabular screws, holding it in very good contact and stable position.  A 28 mm 10-degree liner was snapped in place with a very good fit.  Next, attention was turned to the femur.  Two bone struts were split and further cut to use bone graft building up about the calcar, anterior and posterior aspect of the femur itself.  Limited reaming was done down the femoral canal, but sizing of the femoral stem which was a Mallory modular calcar porous-coated stem.  This was pressed into the femoral canal and bone grafting built up around it with the use of multiple K wires.  It was found to have good contact and stable fixation.  X-ray  was taken which demonstrated good positioning of the stem.  Next, femoral head selection was made and an appropriate size was snapped in place.  Hip was then reduced.  Range of motion was found to be good flexion-extension with improved leg length, no talus coving, and no subluxation.  Copious and abundant irrigation was done with antibiotic solution.  The patient did receive 3 units of packed cells during the surgery.  Wound closure was then done with 0 Vicryl, 2-0 for the subcutaneous, and skin staples for the skin.  Patient was placed in  hip abduction pillow, tolerated the procedure quite well and went to recovery room in stable and satisfactory condition. Dictated by:   Sharmon Revere, M.D. Attending Physician:  Maudry Diego DD:  09/27/01 TD:  09/27/01 Job: [id number]

## 2011-03-05 NOTE — H&P (Signed)
[place]. [facility]  Patient:    Alyssa [name], Alyssa [name] Visit Number: [id number] MRN: [id number]          Service Type: EXP Location: [facility] Attending Physician:  Maudry Diego Dictated by:   Sharmon Revere, M.D. Admit Date:  09/22/2001 Discharge Date: 09/22/2001                           History and Physical  CHIEF COMPLAINT:  Painful right hip.  HISTORY OF PRESENT ILLNESS:  This is a 70 year old female who was seen in the office for complaint of severe pain in the right hip.  Patient had had multiple operations to the right hip with use of bipolar hemiarthroplasty. Patient ambulates with use of walker of cane, obvious limp on the right side with gross shortening on the right side.  PAST MEDICAL HISTORY:  Multiple operations on the right hip with bipolar hip replacement.  Also hemorrhoidectomy.  No history of high blood pressure or diabetes.  ALLERGIES:  None known.  MEDICATIONS:  Vicodin.  HABITS:  Occasional use of alcohol.  Smokes one pack per day.  FAMILY HISTORY:  Noncontributory.  REVIEW OF SYSTEMS:  Basically that of history of present illness.  No cardiac, respiratory, no urinary or bowel symptoms.  PHYSICAL EXAMINATION:  VITAL SIGNS:  Temperature 99, pulse 76, respirations 20, blood pressure 150/70.  Height 5 feet 3 inches, weight 108.  HEENT:  Normocephalic.  Eyes reveal conjunctivae and sclerae clear.  NECK:  Supple.  CHEST:  Clear.  CARDIAC:  S1, S2, regular.  EXTREMITIES:  Patient walks with an antalgic gait with gluteal lurch, limp on the right side.  Approximately 2.5 inch shortening on the right side with gluteal atrophy.  Range of motion of the left hip is limited, ankylotic, with minimal rotation and flexion lag of approximately 30 degrees.  LABORATORY DATA:  X-rays reveal osteolysis involving the femoral shaft as well as the acetabulum, with acetabular protrusio.  IMPRESSION: 1. Loose, painful right total hip  implant. 2. Osteolysis with acetabular protrusio, right hip. Dictated by:   Sharmon Revere, M.D. Attending Physician:  Maudry Diego DD:  09/27/01 TD:  09/27/01 Job: [id number]

## 2011-03-05 NOTE — Op Note (Signed)
NAME:  Alyssa [name], Alyssa [name]                    ACCOUNT NO.:  [id number]   MEDICAL RECORD NO.:  [id number]                   PATIENT TYPE:  INP   LOCATION:  5014                                 FACILITY:  [facility]   PHYSICIAN:  Marily Memos, M.D.                 DATE OF BIRTH:  1941-02-23   DATE OF PROCEDURE:  12/07/2003  DATE OF DISCHARGE:  12/10/2003                                 OPERATIVE REPORT   PREOPERATIVE DIAGNOSIS:  Distal femoral fracture.   POSTOPERATIVE DIAGNOSIS:  Distal femoral fracture.   ANESTHESIA:  General.   PROCEDURE:  Open reduction internal fixation with supracondylar plate, 7-  hole plate, right distal femur.  The patient was taken to the operating  room, given adequate medication given, general anesthesia, intubated.  C-arm  used to visualize placement of the hardware.  A lateral incision made over  the right distal thigh going through the skin, subcutaneous tissue, down to  the fascia.  Sharp and blunt dissection done down to the femur.  A flat  supracondylar plate 7-hole was used.  Opening was done with the plate reamer  followed by placement of the plate and five screws across the plate, two  proximal screws would have been too close to the prosthesis and this  incision line is an AP and lateral, not allowing cortical pressures without  impinging the knee and prosthesis.  Fracture site was then stable.  Irrigation with antibiotic solution was done.  Followed by __________ Vicryl  to the fascia, 2-0 for the subcutaneous and skin staples for the skin.  Compressive dressing was applied.  Hip abduction and brace was reapplied.  The patient tolerated the procedure quite well in the recovery room in  stable and satisfactory condition.                                               Marily Memos, M.D.    AC/MEDQ  D:  12/07/2003  T:  12/08/2003  Job:  [id number]

## 2011-03-05 NOTE — H&P (Signed)
[place]. [facility]  Patient:    Alyssa [name], Alyssa [name] Visit Number: [id number] MRN: [id number]          Service Type: SUR Location: [place] 01 Attending Physician:  Mayme Genta Dictated by:   Sharmon Revere, M.D. Admit Date:  10/25/2001 Discharge Date: 10/26/2001                           History and Physical  REDICTATION  CHIEF COMPLAINT:  Pain to the right hip.  HISTORY OF PRESENT ILLNESS:  This is a 70 year old, status post right total hip revision patient who noticed severe pain in the right hip on getting up from a sitting position.  Patient reported to the office with this complaint and was found with x-rays to have dislocation of her right total hip revision.  HABITS:  Drinks beer occasionally.  FAMILY HISTORY:  Noncontributory.  REVIEW OF SYSTEMS:  Basically as in history of present illness.  ALLERGIES:  None.  MEDICATIONS:  Percocet.  PHYSICAL EXAMINATION:  VITAL SIGNS:  Temperature 98.1, pulse of 64, respirations 16, blood pressure 159/91.  Height 5 feet 3 inches.  Weight 106 pounds.  HEENT:  Normocephalic.  Eyes:  Conjunctivae and sclerae clear.  NECK:  Supple.  CHEST:  Clear.  EXTREMITIES:  Right hip shortened and externally rotated, tender to palpation. Neurovascular status intact.  IMPRESSION:  Dislocation of right total hip revision. Dictated by:   Sharmon Revere, M.D. Attending Physician:  Mayme Genta DD:  11/22/01 TD:  11/22/01 Job: [id number]

## 2011-03-05 NOTE — H&P (Signed)
NAME:  Alyssa [name], Alyssa [name]                    ACCOUNT NO.:  [id number]   MEDICAL RECORD NO.:  [id number]                   PATIENT TYPE:  INP   LOCATION:  5014                                 FACILITY:  [facility]   PHYSICIAN:  Marily Memos, M.D.                 DATE OF BIRTH:  [birth year]   DATE OF ADMISSION:  12/07/2003  DATE OF DISCHARGE:                                HISTORY & PHYSICAL   CHIEF COMPLAINT:  Painful right thigh and knee.   HISTORY OF PRESENT ILLNESS:  This is a 70 year old female who had undergone  a right total hip revision of the right hip, both femoral and acetabular  components, followed by dislocation just prior to discharge which was two  days ago.  The patient had attempted closed but open reduction of the  dislocation, did quite well postoperatively and was discharged two days ago  and developed severe pain in the right thigh and knee area which  progressively got worse.  The patient denies any falls or stumbling.  She  was on partial weightbearing on the right side.  The patient came to [facility] for treatment.   PAST MEDICAL HISTORY:  1. Bronchitis.  2. High blood pressure.  3. Right total hip revision x 2.  4. Right total hip dislocation x 1.   REVIEW OF SYSTEMS:  Recurrent cough and bronchitis.  No shortness of breath  or chest pain.   ALLERGIES:  None known.   MEDICATIONS:  Diovan, HCTZ, Coumadin, Percocet, and Sonata for sleeping.   FAMILY HISTORY:  Noncontributory.   SOCIAL HISTORY:  The patient lives with her husband.  The patient does smoke  one pack per day, also with occasional use of alcohol.   PHYSICAL EXAMINATION:  GENERAL:  Alert and oriented, some distress and pain  in the right thigh and knee.  VITAL SIGNS:  Temperature 98.6, height 5 feet 1 inch, weight 112,  respirations 20, pulse 112, blood pressure 165/108.  HEAD:  Normocephalic.  NECK:  Supple.  CHEST:  Clear.  CARDIAC:  S1 S2 regular.  ABDOMEN:  Soft.   Active bowel sounds.  MUSCULOSKELETAL:  The patient lies on a stretcher with hip abduction brace  in place.  Tender right distal femur above the knee.  Minimal swelling.  Neurovascular status is intact.   X-ray reveals fractured distal femur at the tip end of the total hip stem  prosthesis.  It extends only through one cortex, nondisplaced.   IMPRESSION:  Fracture distal femur.   PLAN:  Open reduction internal fixation, right distal femur.                                                Marily Memos, M.D.    AC/MEDQ  D:  12/07/2003  T:  12/08/2003  Job:  [id number]

## 2011-03-05 NOTE — Op Note (Signed)
[name], Alyssa                      ACCOUNT NO.:  [id number]   MEDICAL RECORD NO.:  [id number]                  PATIENT TYPE:   LOCATION:                                       FACILITY:   PHYSICIAN:  Marily Memos, M.D.                 DATE OF BIRTH:   DATE OF PROCEDURE:  01/04/2004  DATE OF DISCHARGE:                                 OPERATIVE REPORT   PREOPERATIVE DIAGNOSIS:  Fractured distal femur, left femur.   POSTOPERATIVE DIAGNOSIS:  Fractured distal femur, left femur.   ANESTHESIA:  General.   PROCEDURE:  Open reduction internal fixation with plate and screw, right  distal femur.   SURGEON:  Marily Memos, M.D.   The patient was taken to the operating room after given preop medication,  given general anesthesia and intubated.  The right lower extremity was  prepped with DuraPrep and draped in the sterile manner.  C-arm used to  visualize fracture reduction and plate fixation.  A lateral incision was  made over the left knee, proximally and proceeded proximally from the  lateral femoral condyle, proximally laterally through the skin and  subcutaneous tissue down to the fascia lata down until sharp and blunt  dissection was made down to the fracture site of the distal femur and  supracondylar area.  A right angled supracondylar plate was put in place  with a six hole plate.  Six screws were placed across into the femur,  stabilized in fixation, which was found to be anatomic and very good and  stable.  This was visualized with the C-arm and AP, lateral and oblique  views.  Irrigation was then done with the use of antibiotic solution.  Hemostasis obtained with the Bovie.  Wound closure was then done with the  use of 0 Vicryl for the fascia, 2-0 for the subcutaneous and skin staples  for skin.  Compressive dressing was applied and knee immobilizer applied.  The patient tolerated the procedure quite well and went to recovery room in  stable and satisfactory  condition.                                               Marily Memos, M.D.    AC/MEDQ  D:  01/15/2004  T:  01/15/2004  Job:  [id number]

## 2011-03-05 NOTE — Discharge Summary (Signed)
[name], Alyssa [name]          ACCOUNT NO.:  [id number]   MEDICAL RECORD NO.:  [id number]          PATIENT TYPE:  INP   LOCATION:  5504                         FACILITY:  [facility]   PHYSICIAN:  Ardyth Gal. Spruill, M.D.DATE OF BIRTH:  1941/09/20   DATE OF ADMISSION:  12/29/2004  DATE OF DISCHARGE:  01/02/2005                                 DISCHARGE SUMMARY   ADMISSION DIAGNOSES:  1.  Suspected urosepsis.  2.  Degenerative joint disease.   DISCHARGE DIAGNOSES:  1.  Urinary tract infection.  2.  Osteoarthritis.  3.  Hypertension.  4.  Hypopotassemia.   [name] is a 70 year old well-developed, well-nourished patient who  presented initially to the emergency department of the [facility] on December 29, 2004, at which time she complained of being  weak, dizzy and having aching all over.  She gave history that this started  on the evening prior to her admission.  She was evaluated in the emergency  department where she was found to have an elevation of her white blood cell  count on CBC of 16,800, her hemoglobin was noted to be 10.8, her hematocrit  was 31.1 and her platelet count was 449,000.  Her urinalysis revealed a  cloudy specimen with a specific gravity of 1.017.  She had a trace of  hemoglobin present.  There was a large leukocyte esterase and there were too  many to count white blood cells on microscopic examination.  Her chemistry  panel was within normal limits.  She was noted to be tachycardic at 124  beats per minute upon her arrival to the emergency department.  Given her  weakness and dizziness and suspicion of urosepsis, the patient was admitted  to the medical service for further evaluation and aggressive treatment of  this particular problem.   HOSPITAL COURSE:  The patient was admitted to the medical service, started  on IV fluids and IV antibiotics (Cipro).  The patient was noted to be mildly  anemic on initial studies and she was  transfused with packed red blood cells  due to her hemoglobin of 8.6.   There were also some complications with blood pressure control and these  were addressed with good outcome.   On January 02, 2005, it was noted that the symptoms of urosepsis had  significantly improved.  The patient was well  hydrated.  The hemoglobin and  hematocrit had significantly improved and the blood pressure was under much  better control.  It was the opinion that the patient had received maximum  benefit from this hospitalization and could be discharged home with  additional outpatient follow-up, particularly of her anemia and urinary  tract infection.   DISCHARGE MEDICATIONS:  1.  Percocet 5/325 one every four to six hours for pain.  2.  Cipro 500 mg two times daily.  3.  Protonix 40 mg daily.  4.  Norvasc 5 mg daily.  5.  Toprol 50 mg daily.  6.  Iron one by mouth three times a day.   Patient is placed on a low sodium diet.  She is to follow up in  the office  in two weeks, sooner if any changes, problems, or complications.       HB/MEDQ  D:  03/16/2005  T:  03/16/2005  Job:  [id number]

## 2011-04-01 IMAGING — CR DG PELVIS 1-2V
2 series · 2 of 2 positions shown · non-contrast
Comparison: Chest and two views abdomen 05/06/2006.

CLINICAL DATA: Pain.

PELVIS - 1-2 VIEW

[t pelvis a.p. (1 of 2)]
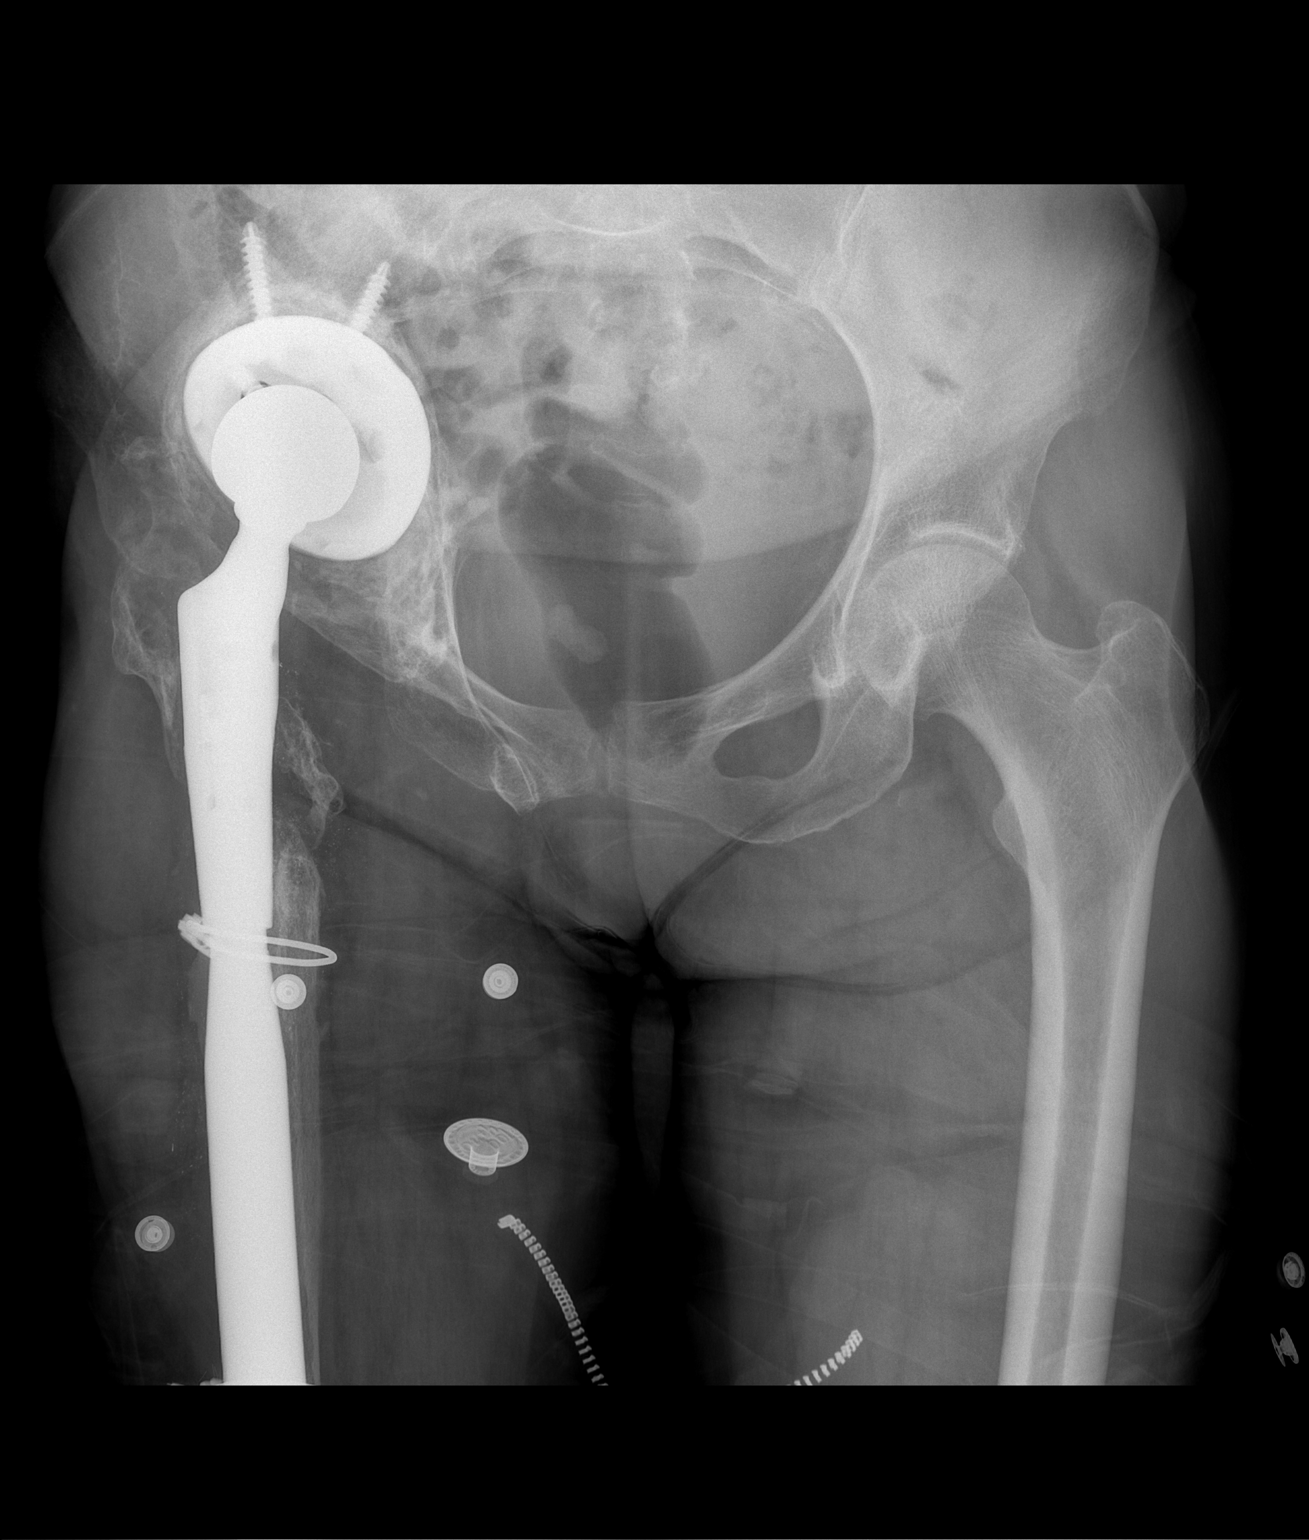

[t pelvis a.p. (2 of 2)]
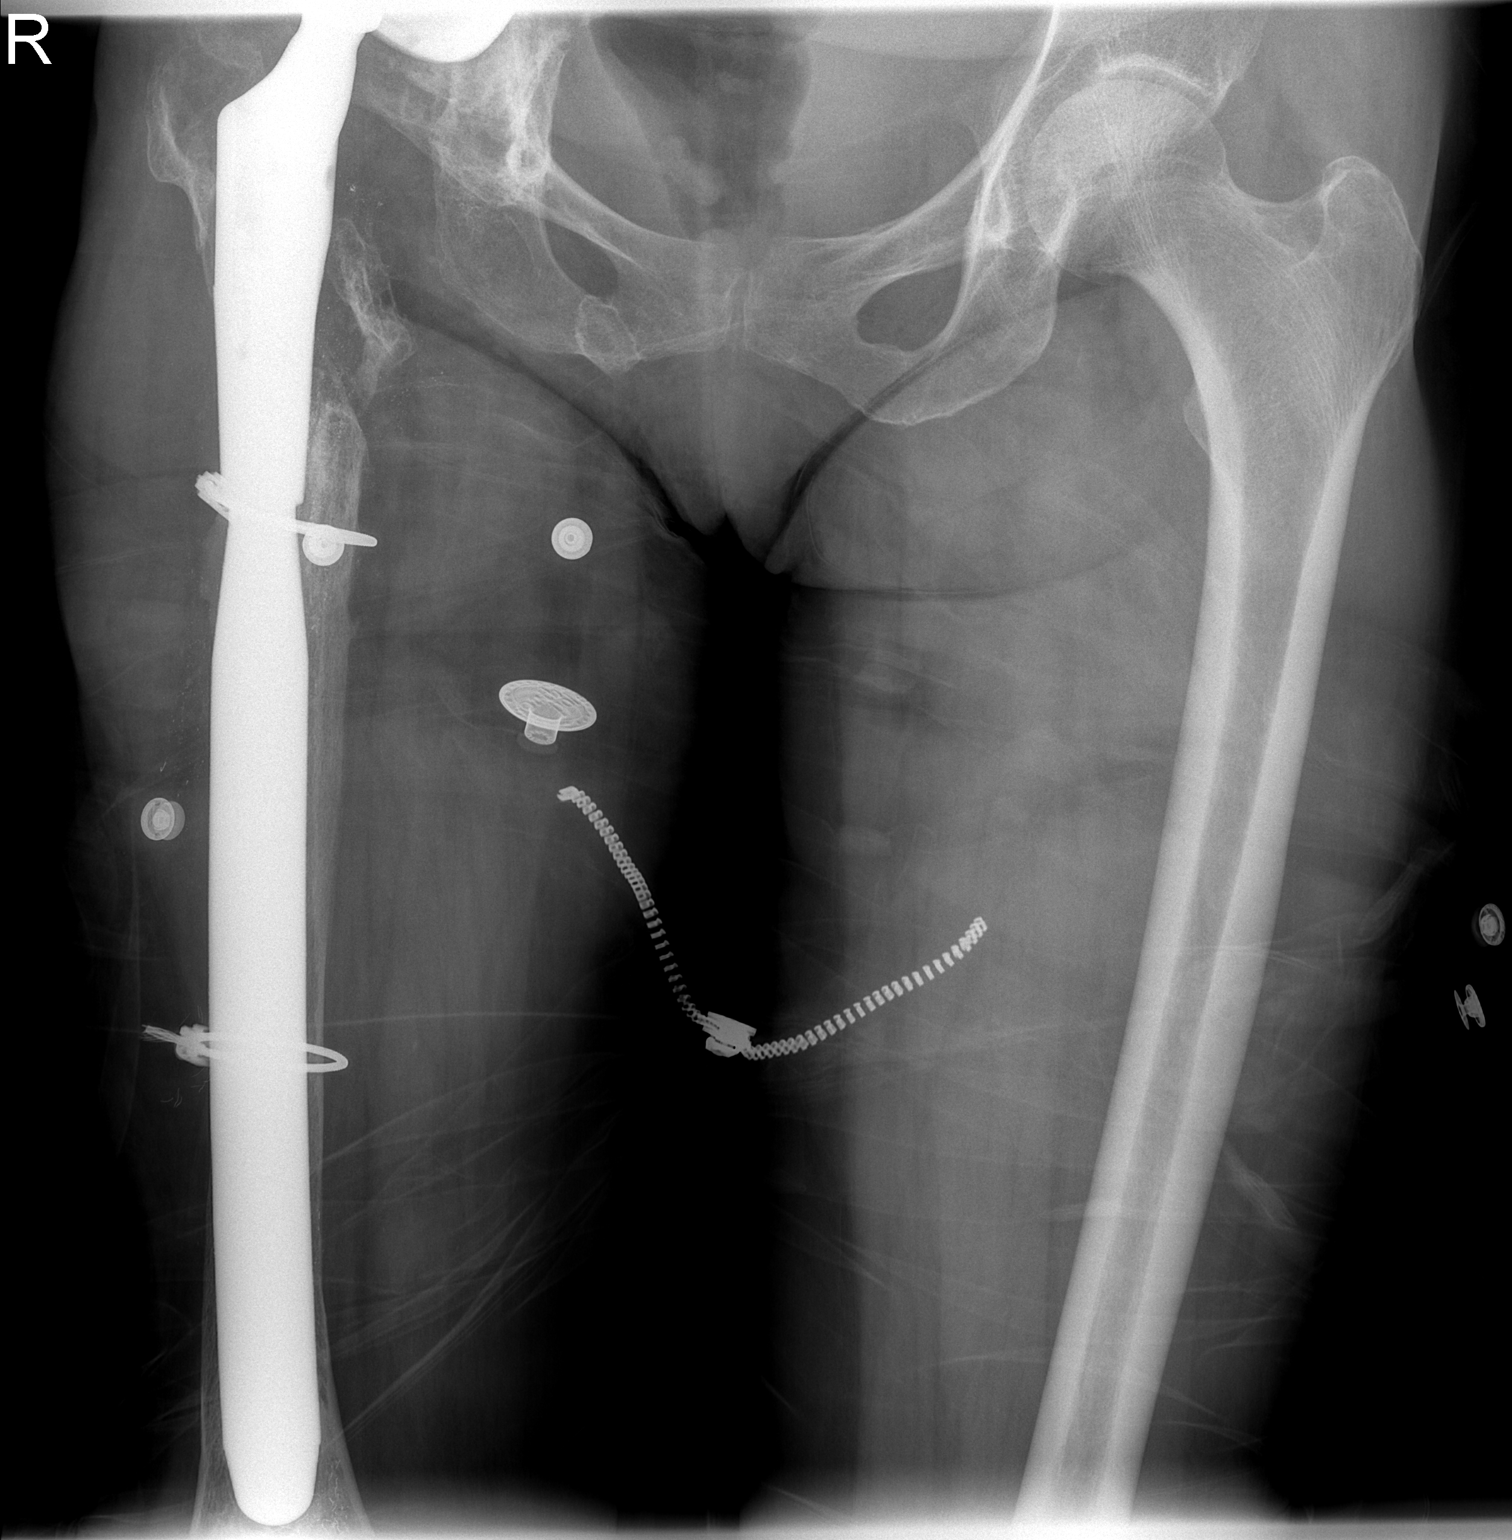

[2 of 2 positions shown; findings below may reference images not displayed]

FINDINGS: There is no acute bony or joint abnormality.  The patient
has a right total hip arthroplasty.  Superior displacement of the
acetabular cup into the ileum appears unchanged.  Soft tissues
unremarkable.
IMPRESSION: 1.  No acute finding.
2.  No interval change in appearance right total hip replacement
which has migrated superiorly into the ileum.

## 2011-04-13 ENCOUNTER — Other Ambulatory Visit (HOSPITAL_COMMUNITY): Payer: Self-pay | Admitting: Cardiology

## 2011-04-22 ENCOUNTER — Emergency Department (HOSPITAL_COMMUNITY)
Admission: EM | Admit: 2011-04-22 | Discharge: 2011-04-22 | Discharge status: Against Medical Advice | Payer: No Typology Code available for payment source | Attending: Emergency Medicine | Admitting: Emergency Medicine

## 2011-04-22 DIAGNOSIS — Z0389 Encounter for observation for other suspected diseases and conditions ruled out: Secondary | ICD-10-CM | POA: Insufficient documentation

## 2011-04-30 ENCOUNTER — Other Ambulatory Visit (HOSPITAL_COMMUNITY): Payer: Self-pay

## 2011-06-09 IMAGING — CR DG LUMBAR SPINE COMPLETE 4+V
5 series · 5 of 5 positions shown · non-contrast
Comparison: Examination of the pelvis and right hip of same date.

CLINICAL DATA: History of injury from fall with pain.

LUMBAR SPINE - COMPLETE 4+ VIEW

[t l-spine a.p.]
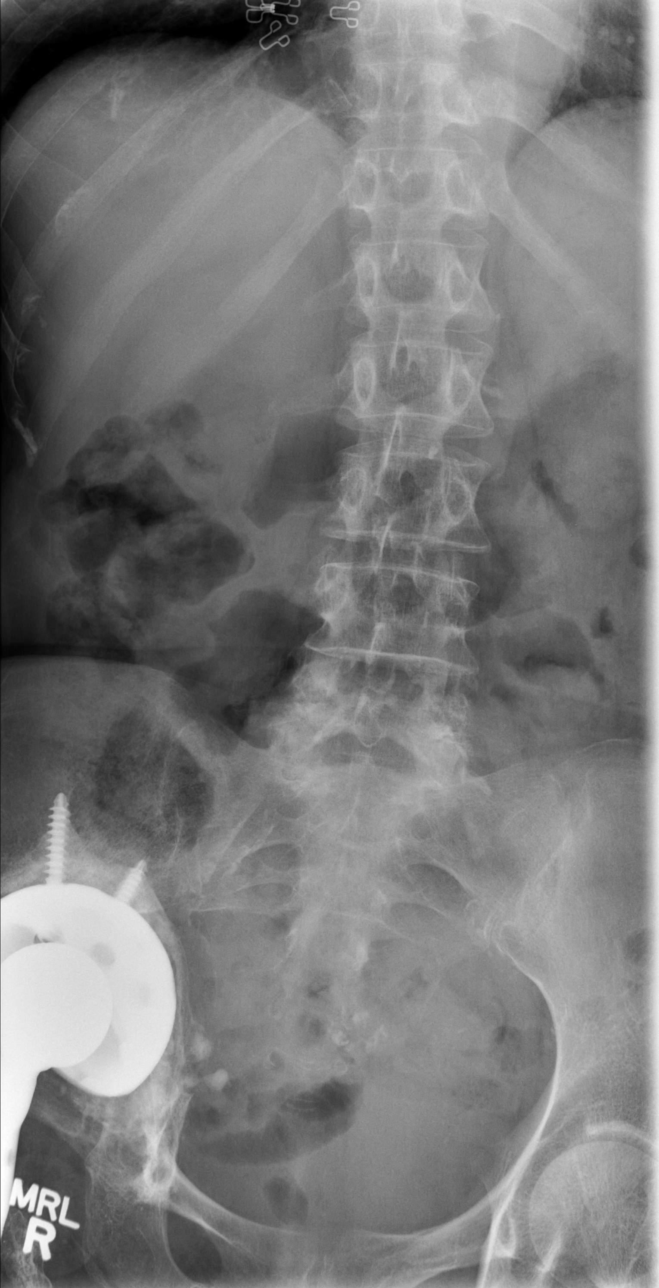

[t l-spine oblique exposure (1 of 2)]
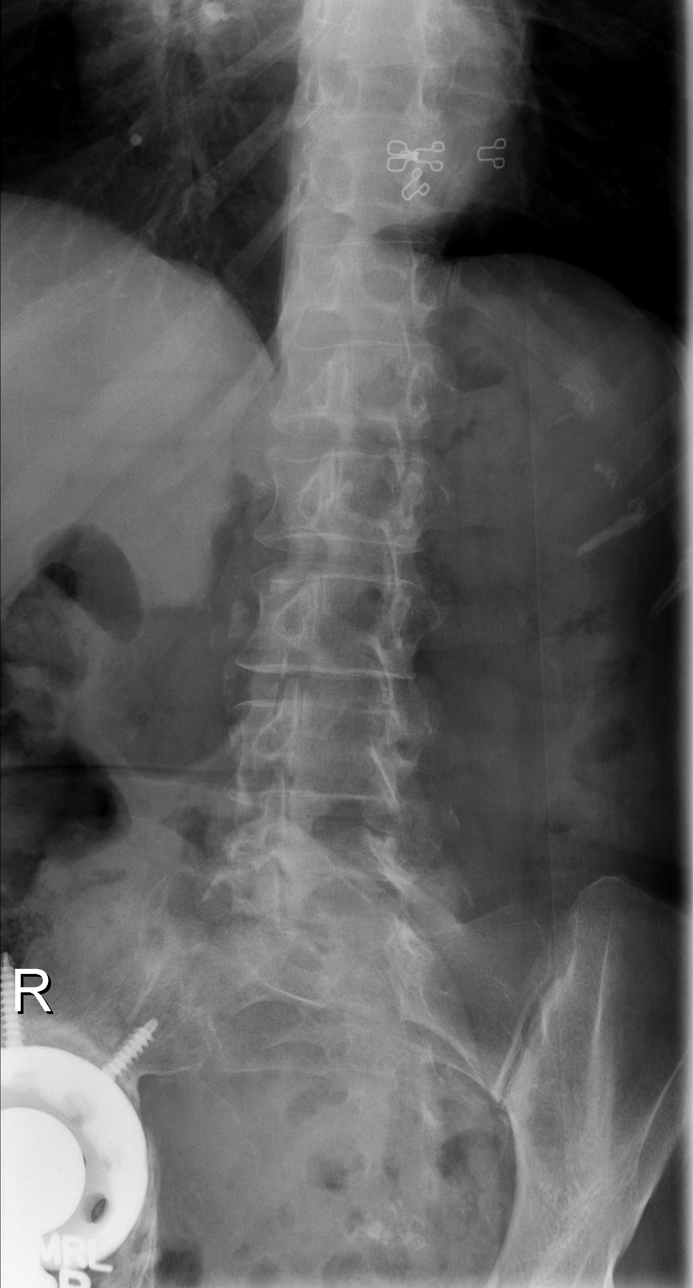

[t l-spine oblique exposure (2 of 2)]
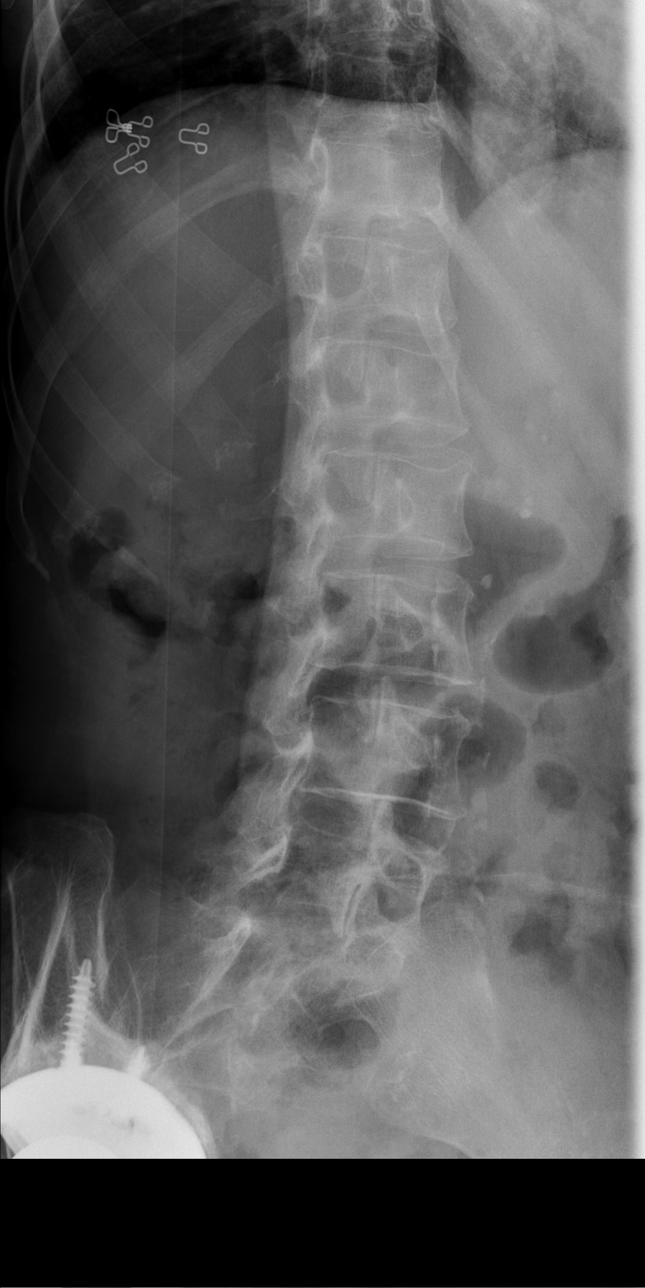

[t l-spine lat]
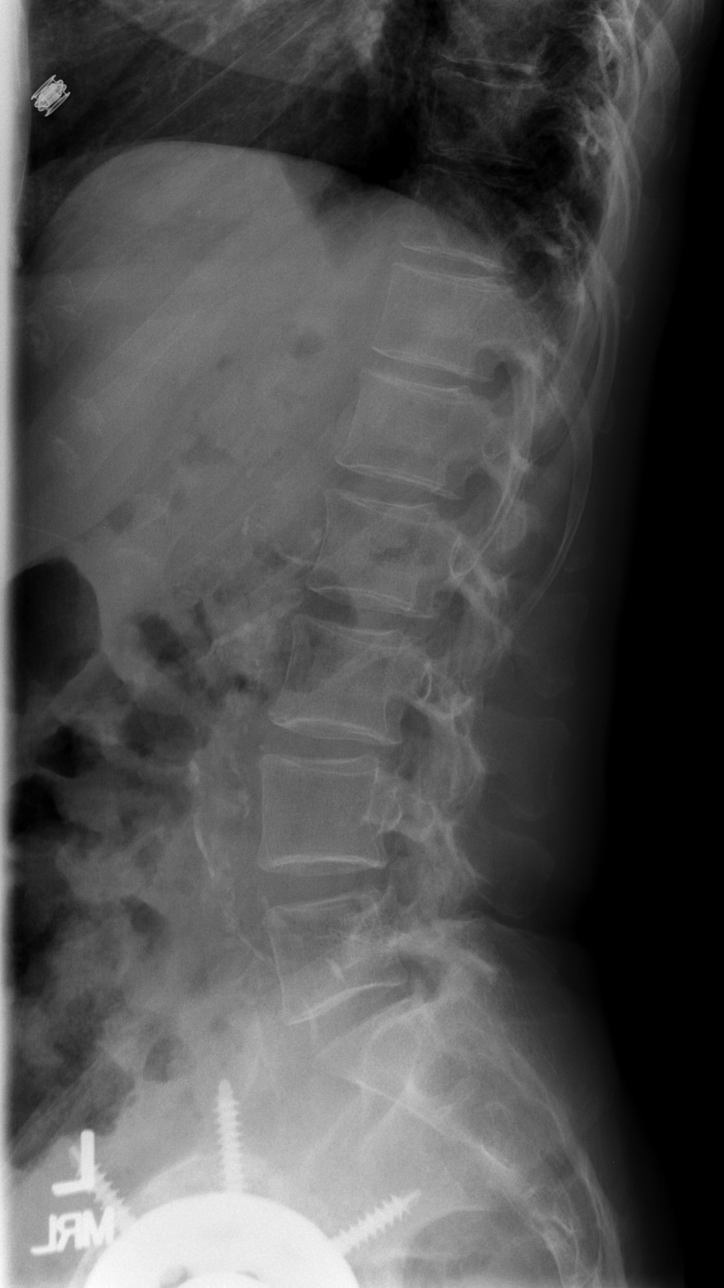

[t l-spine l5-s1 spot]
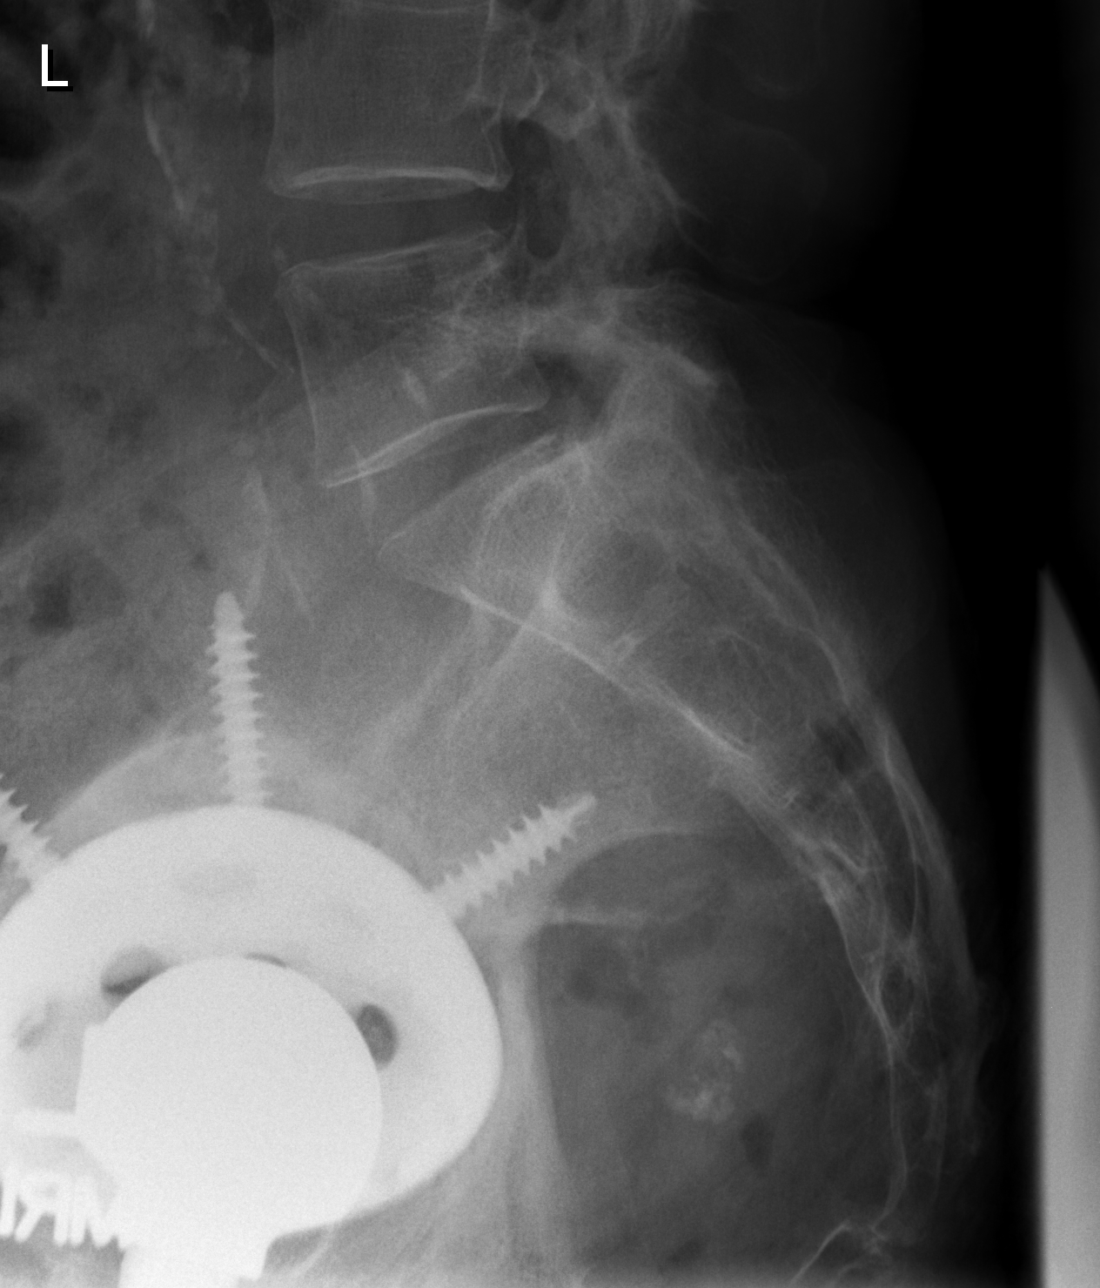

[5 of 5 positions shown; findings below may reference images not displayed]

FINDINGS: Previous total right hip replacement arthroplasty
procedure has been performed.  There is protrusio acetabuli.  These
findings are described in detail on the examination of the pelvis
right hip.

There is an osteopenic appearance of the bones.  There is slight
scoliosis convexity to the left.  Ectasia and nonaneurysmal
calcifications of the abdominal aorta and iliac arteries are seen.

The intervertebral disc spaces are maintained.  There is osteopenic
appearance of the bones.  No lumbar fracture is evident.  No pars
defects are seen.  There is minimal degenerative spondylosis.
IMPRESSION: There is no evidence of acute fracture or lumbar fracture.  No
subluxation is seen.  There is osteopenic appearance of bones.
Scoliosis.  Degenerative spondylosis.  Nonaneurysmal aortic iliac
calcifications.

## 2011-06-09 IMAGING — CR DG HIP COMPLETE 2+V*R*
3 series · 3 of 3 positions shown · non-contrast
Comparison: 08/27/2009 study.

CLINICAL DATA: History given of injury from fall.  Right hip pain.

RIGHT HIP - COMPLETE 2+ VIEW

[t pelvis a.p.]
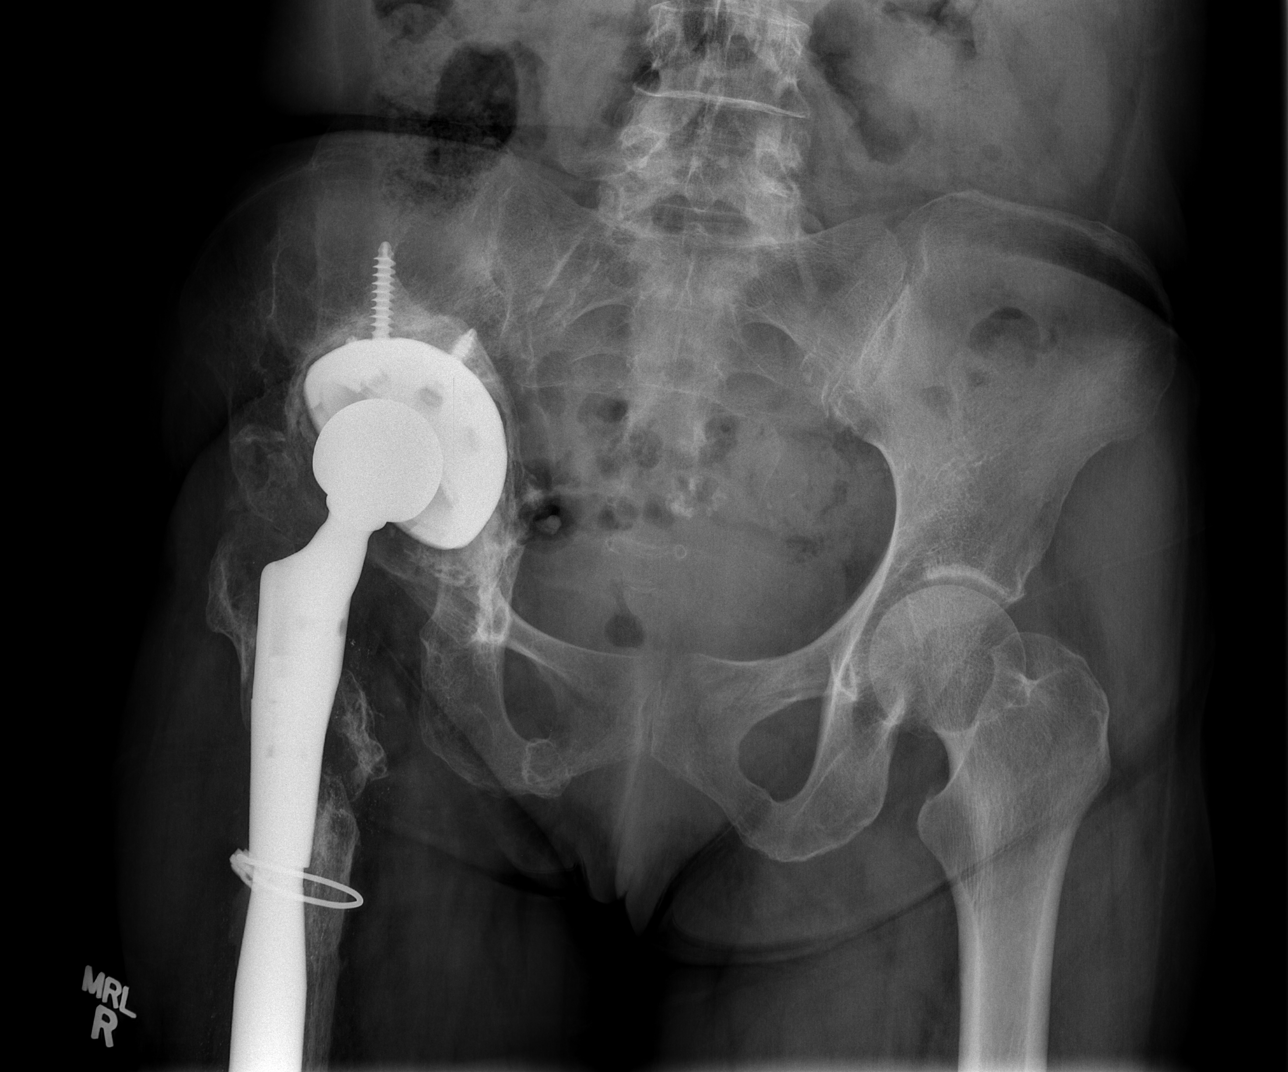

[t hip ap right]
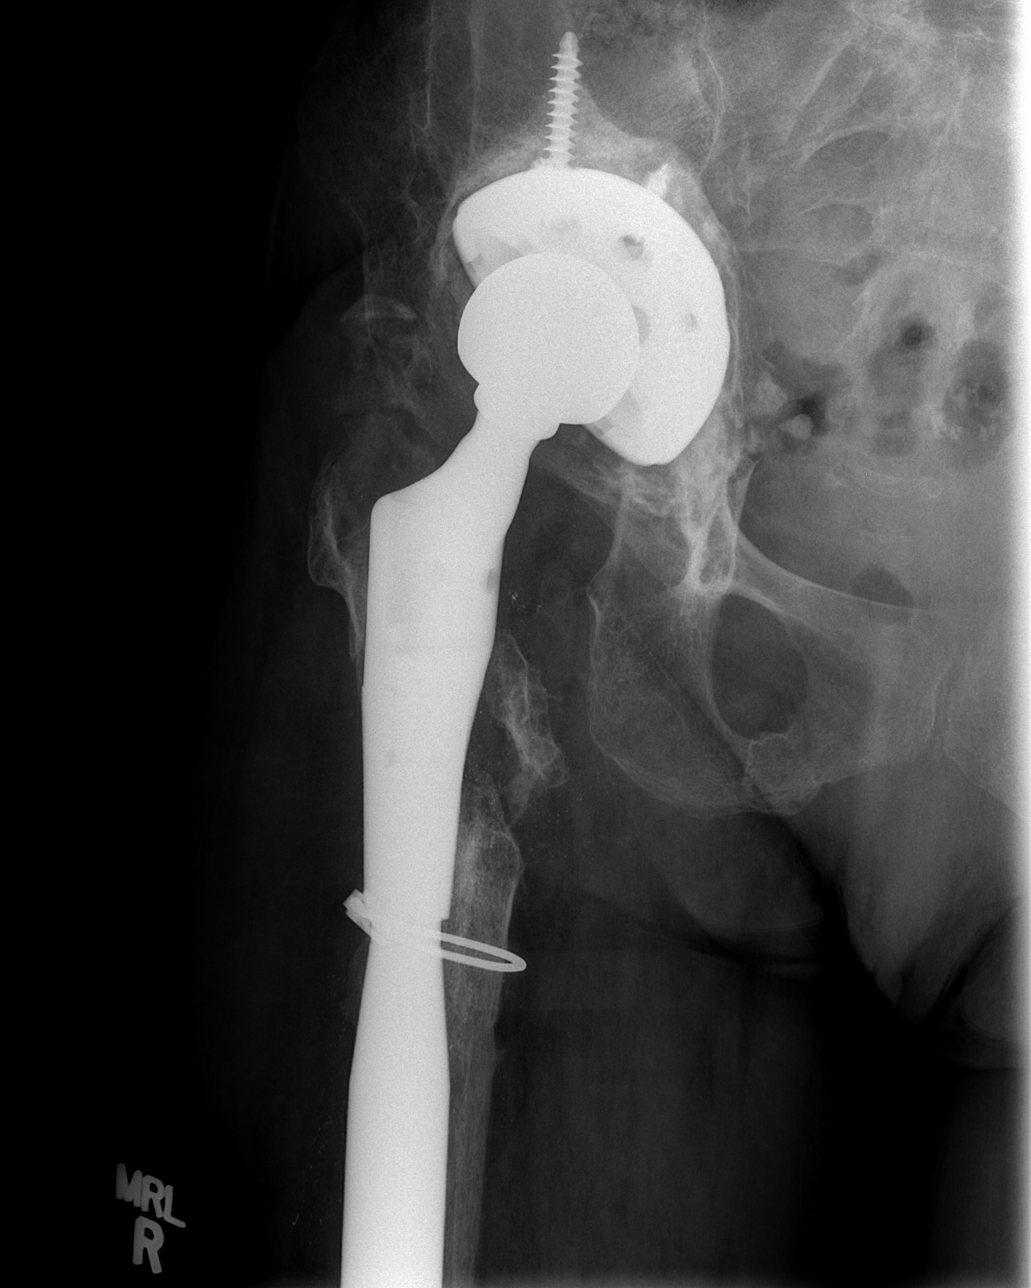

[t hip frog leg right]
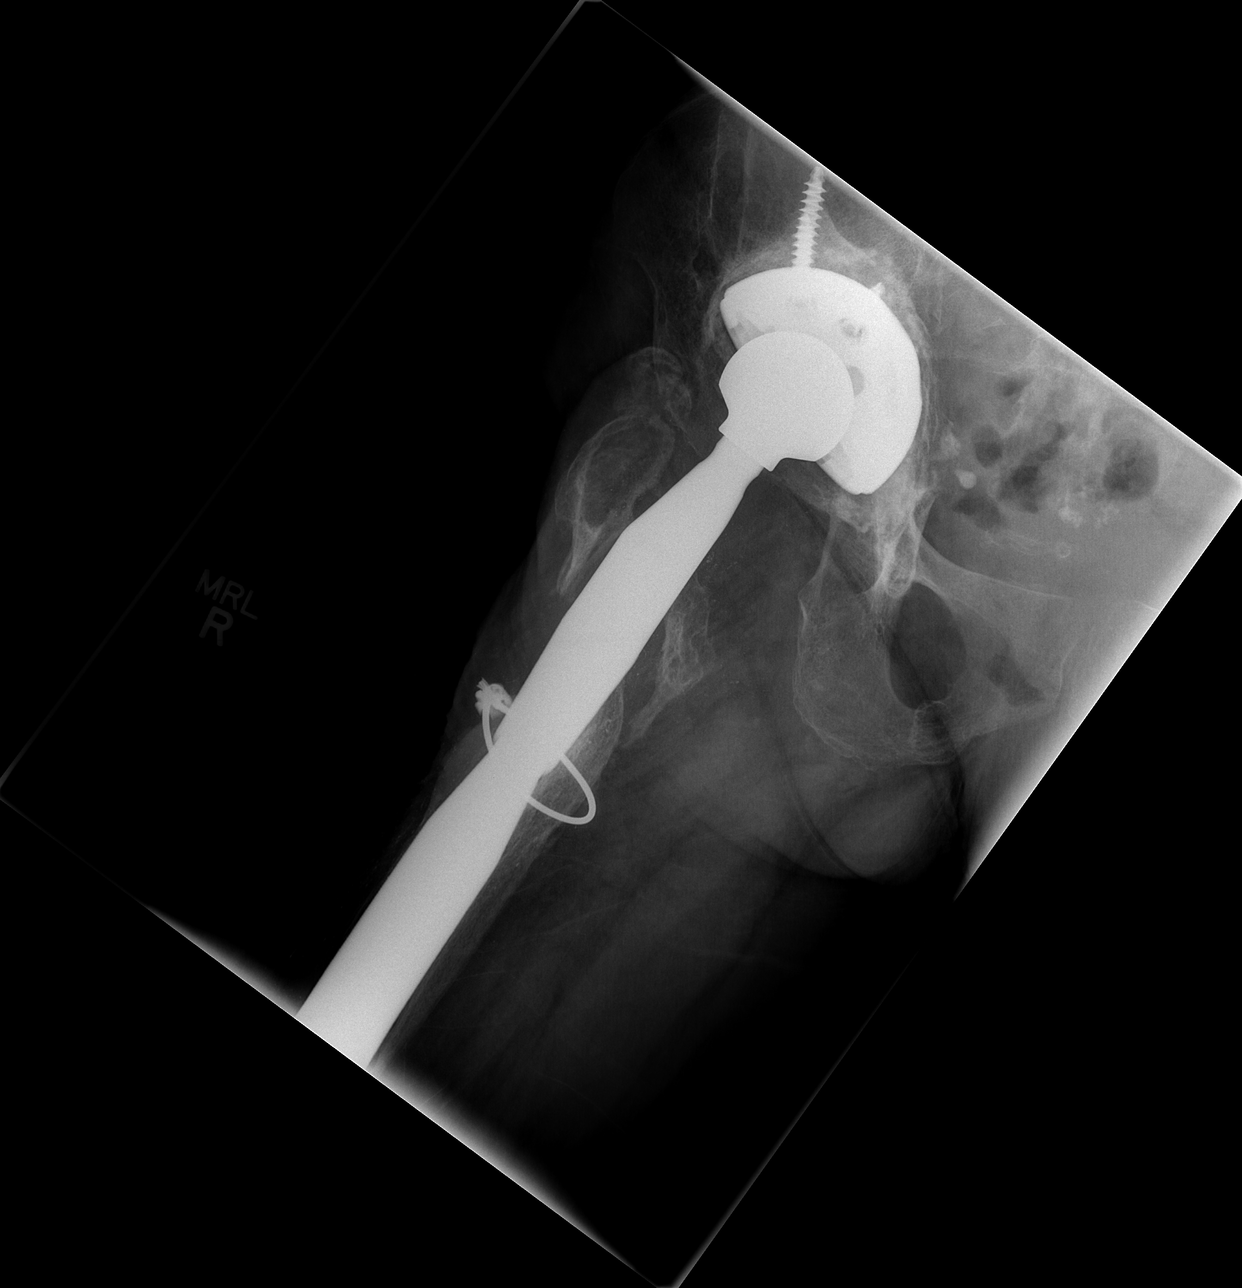

[3 of 3 positions shown; findings below may reference images not displayed]

FINDINGS: The patient is status post total right hip replacement
arthroplasty procedure with a long femoral rod.  There is chronic
protrusio acetabuli.  No acute fracture disruption of hardware is
seen.  No dislocation is evident.  Old healed fracture of the right
ischial ramus is seen.  There is an osteopenic appearance of the
bones.
IMPRESSION: Status post right total hip replacement arthroplasty procedure.
Chronic protrusio acetabuli on the right. No dislocation or new
fracture is seen.  Osteopenic appearance of bones.  Old fracture of
right ischial ramus.

## 2011-07-14 ENCOUNTER — Emergency Department (HOSPITAL_COMMUNITY): Payer: No Typology Code available for payment source

## 2011-07-14 ENCOUNTER — Emergency Department (HOSPITAL_COMMUNITY)
Admission: EM | Admit: 2011-07-14 | Discharge: 2011-07-14 | Disposition: A | Discharge status: Home / Self Care | Payer: No Typology Code available for payment source | Attending: Emergency Medicine | Admitting: Emergency Medicine

## 2011-07-14 DIAGNOSIS — Z79899 Other long term (current) drug therapy: Secondary | ICD-10-CM | POA: Insufficient documentation

## 2011-07-14 DIAGNOSIS — E785 Hyperlipidemia, unspecified: Secondary | ICD-10-CM | POA: Insufficient documentation

## 2011-07-14 DIAGNOSIS — I10 Essential (primary) hypertension: Secondary | ICD-10-CM | POA: Insufficient documentation

## 2011-07-14 DIAGNOSIS — F17200 Nicotine dependence, unspecified, uncomplicated: Secondary | ICD-10-CM | POA: Insufficient documentation

## 2011-07-14 DIAGNOSIS — R071 Chest pain on breathing: Secondary | ICD-10-CM | POA: Insufficient documentation

## 2011-07-14 DIAGNOSIS — M1990 Unspecified osteoarthritis, unspecified site: Secondary | ICD-10-CM | POA: Insufficient documentation

## 2011-07-14 DIAGNOSIS — M549 Dorsalgia, unspecified: Secondary | ICD-10-CM | POA: Insufficient documentation

## 2011-07-14 LAB — DIFFERENTIAL
Basophils Absolute: 0.1 10*3/uL (ref 0.0–0.1)
Basophils Relative: 1 % (ref 0–1)
Eosinophils Absolute: 1 10*3/uL — ABNORMAL HIGH (ref 0.0–0.7)
Eosinophils Relative: 10 % — ABNORMAL HIGH (ref 0–5)
Lymphocytes Relative: 26 % (ref 12–46)
Lymphs Abs: 2.6 10*3/uL (ref 0.7–4.0)
Monocytes Absolute: 0.6 10*3/uL (ref 0.1–1.0)
Monocytes Relative: 6 % (ref 3–12)
Neutro Abs: 5.8 10*3/uL (ref 1.7–7.7)
Neutrophils Relative %: 57 % (ref 43–77)

## 2011-07-14 LAB — CBC
HCT: 31.7 % — ABNORMAL LOW (ref 36.0–46.0)
Hemoglobin: 10.9 g/dL — ABNORMAL LOW (ref 12.0–15.0)
MCH: 29.5 pg (ref 26.0–34.0)
MCHC: 34.4 g/dL (ref 30.0–36.0)
MCV: 85.7 fL (ref 78.0–100.0)
Platelets: 330 10*3/uL (ref 150–400)
RBC: 3.7 MIL/uL — ABNORMAL LOW (ref 3.87–5.11)
RDW: 17.3 % — ABNORMAL HIGH (ref 11.5–15.5)
WBC: 10.1 10*3/uL (ref 4.0–10.5)

## 2011-07-14 LAB — POCT I-STAT, CHEM 8
BUN: 34 mg/dL — ABNORMAL HIGH (ref 6–23)
Calcium, Ion: 1.34 mmol/L — ABNORMAL HIGH (ref 1.12–1.32)
Chloride: 116 mEq/L — ABNORMAL HIGH (ref 96–112)
Creatinine, Ser: 1.5 mg/dL — ABNORMAL HIGH (ref 0.50–1.10)
Glucose, Bld: 178 mg/dL — ABNORMAL HIGH (ref 70–99)
HCT: 33 % — ABNORMAL LOW (ref 36.0–46.0)
Hemoglobin: 11.2 g/dL — ABNORMAL LOW (ref 12.0–15.0)
Potassium: 4.3 mEq/L (ref 3.5–5.1)
Sodium: 139 mEq/L (ref 135–145)
TCO2: 15 mmol/L (ref 0–100)

## 2011-07-30 LAB — SEDIMENTATION RATE: Sed Rate: 43 — ABNORMAL HIGH

## 2011-07-30 LAB — CBC
HCT: 30.3 — ABNORMAL LOW
Hemoglobin: 10.2 — ABNORMAL LOW
MCHC: 33.5
MCV: 91
Platelets: 422 — ABNORMAL HIGH
RBC: 3.33 — ABNORMAL LOW
RDW: 15.9 — ABNORMAL HIGH
WBC: 8.6

## 2011-07-30 LAB — BASIC METABOLIC PANEL
BUN: 11
CO2: 21
Calcium: 9.6
Chloride: 111
Creatinine, Ser: 0.8
GFR calc Af Amer: >60
GFR calc non Af Amer: >60
Glucose, Bld: 100 — ABNORMAL HIGH
Potassium: 3.6
Sodium: 140

## 2011-07-30 LAB — DIFFERENTIAL
Basophils Absolute: 0.1
Basophils Relative: 1
Eosinophils Absolute: 0.2
Eosinophils Relative: 2
Lymphocytes Relative: 31
Lymphs Abs: 2.7
Monocytes Absolute: 0.6
Monocytes Relative: 7
Neutro Abs: 5
Neutrophils Relative %: 59

## 2011-08-03 LAB — I-STAT 8, (EC8 V) (CONVERTED LAB)
Acid-base deficit: 10 — ABNORMAL HIGH
BUN: 26 — ABNORMAL HIGH
Bicarbonate: 14.6 — ABNORMAL LOW
Chloride: 116 — ABNORMAL HIGH
Glucose, Bld: 110 — ABNORMAL HIGH
HCT: 41
Hemoglobin: 13.9
Operator id: 128471
Potassium: 4.4
Sodium: 139
TCO2: 16
pCO2, Ven: 29.6 — ABNORMAL LOW
pH, Ven: 7.302 — ABNORMAL HIGH

## 2011-09-13 ENCOUNTER — Encounter: Payer: Self-pay | Admitting: Vascular Surgery

## 2011-09-15 IMAGING — CR DG LUMBAR SPINE COMPLETE 4+V
5 series · 5 of 5 positions shown · non-contrast
Comparison: 05/01/2010 and earlier.

CLINICAL DATA: 69-year-old female with pain in the mid back
radiating down both legs with numbness and tingling.

LUMBAR SPINE - COMPLETE 4+ VIEW

[t l-spine a.p.]
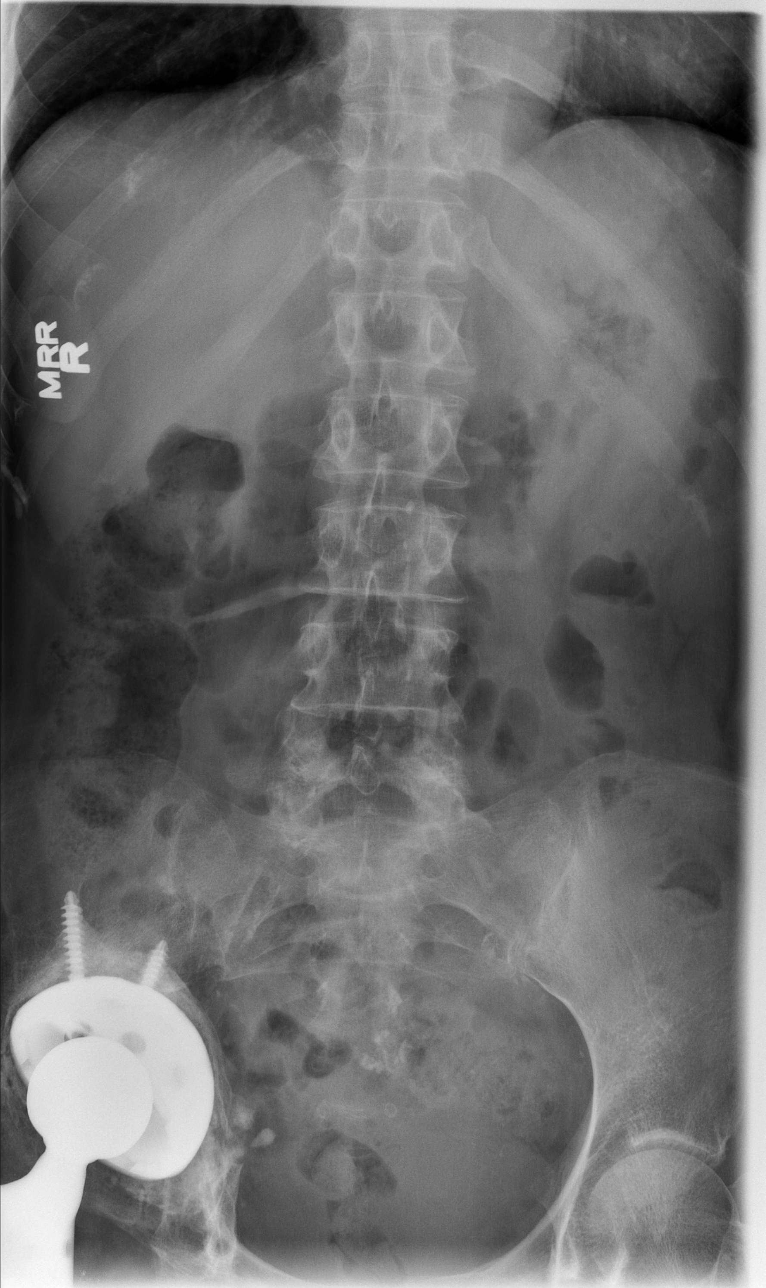

[t l-spine oblique exposure (1 of 2)]
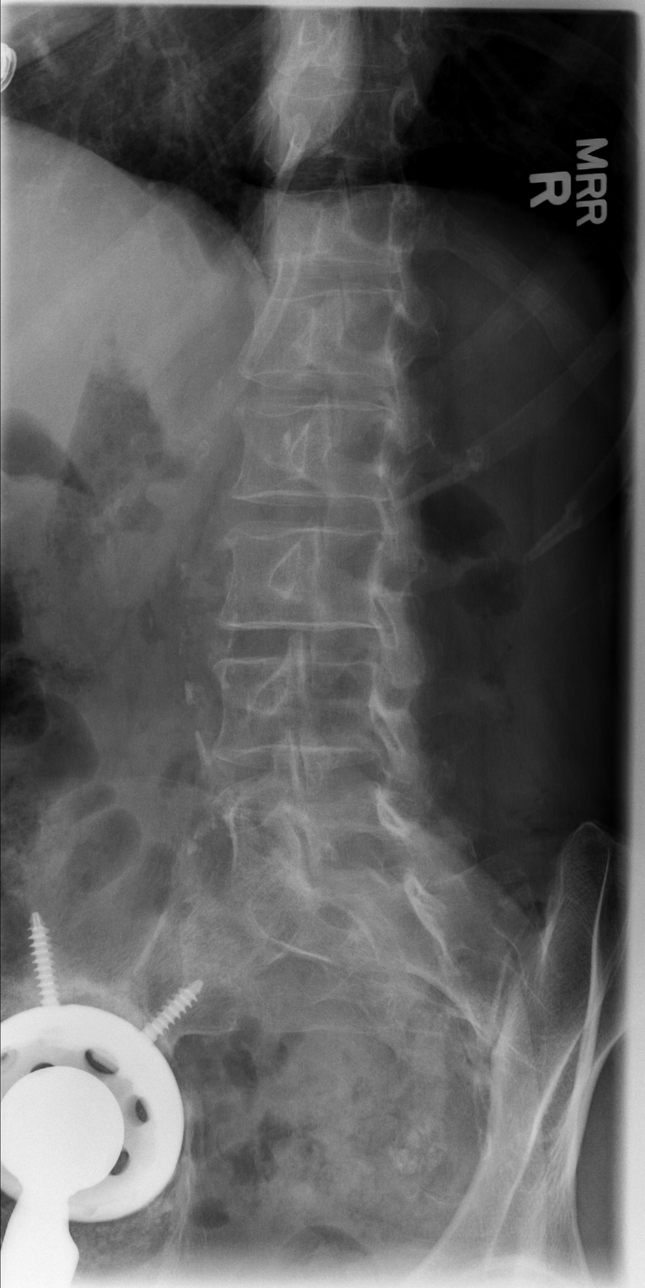

[t l-spine oblique exposure (2 of 2)]
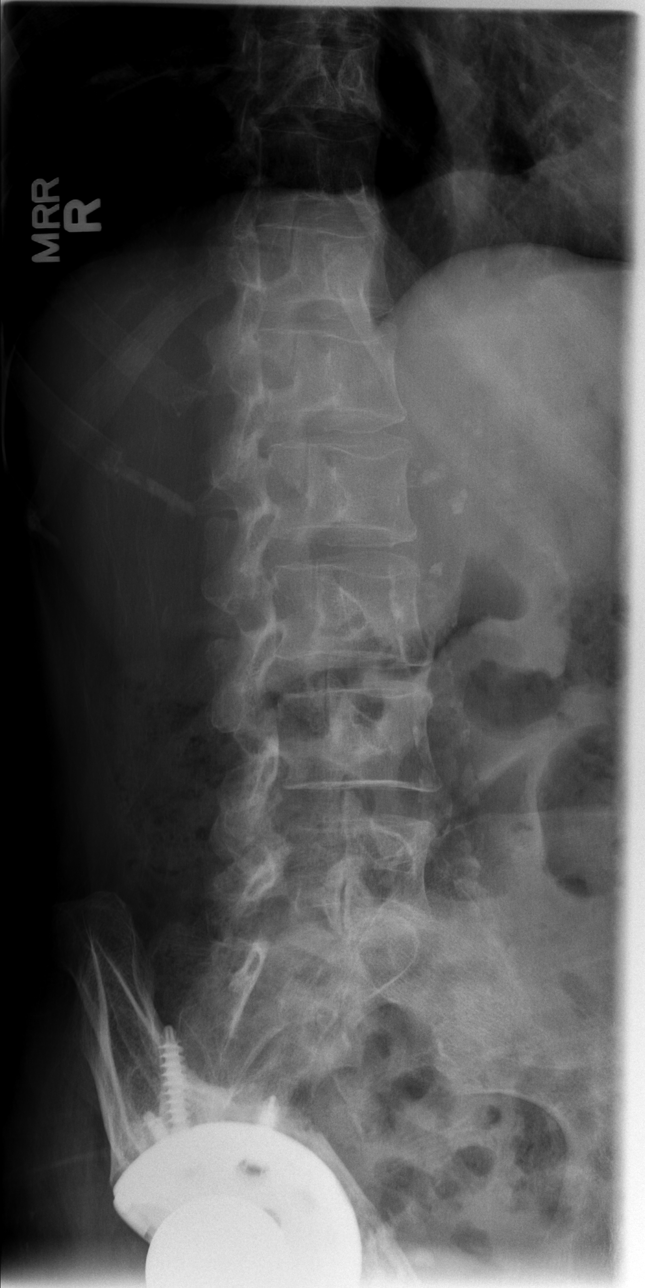

[t l-spine lat]
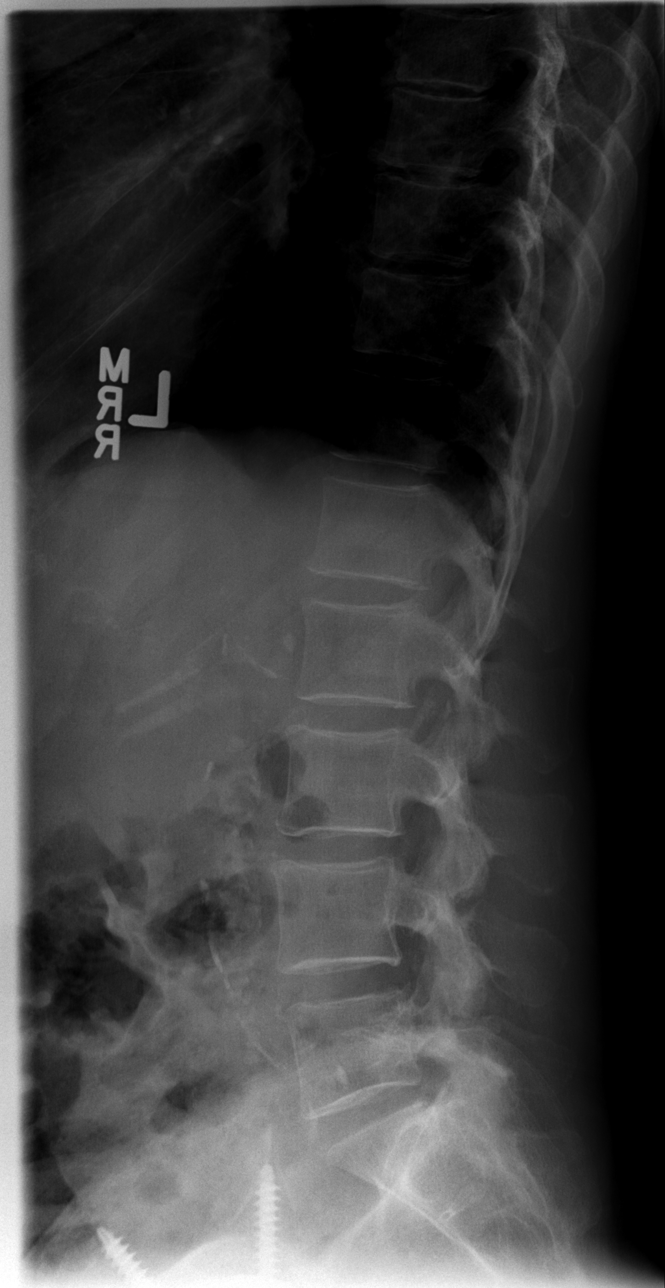

[t l-spine l5-s1 spot]
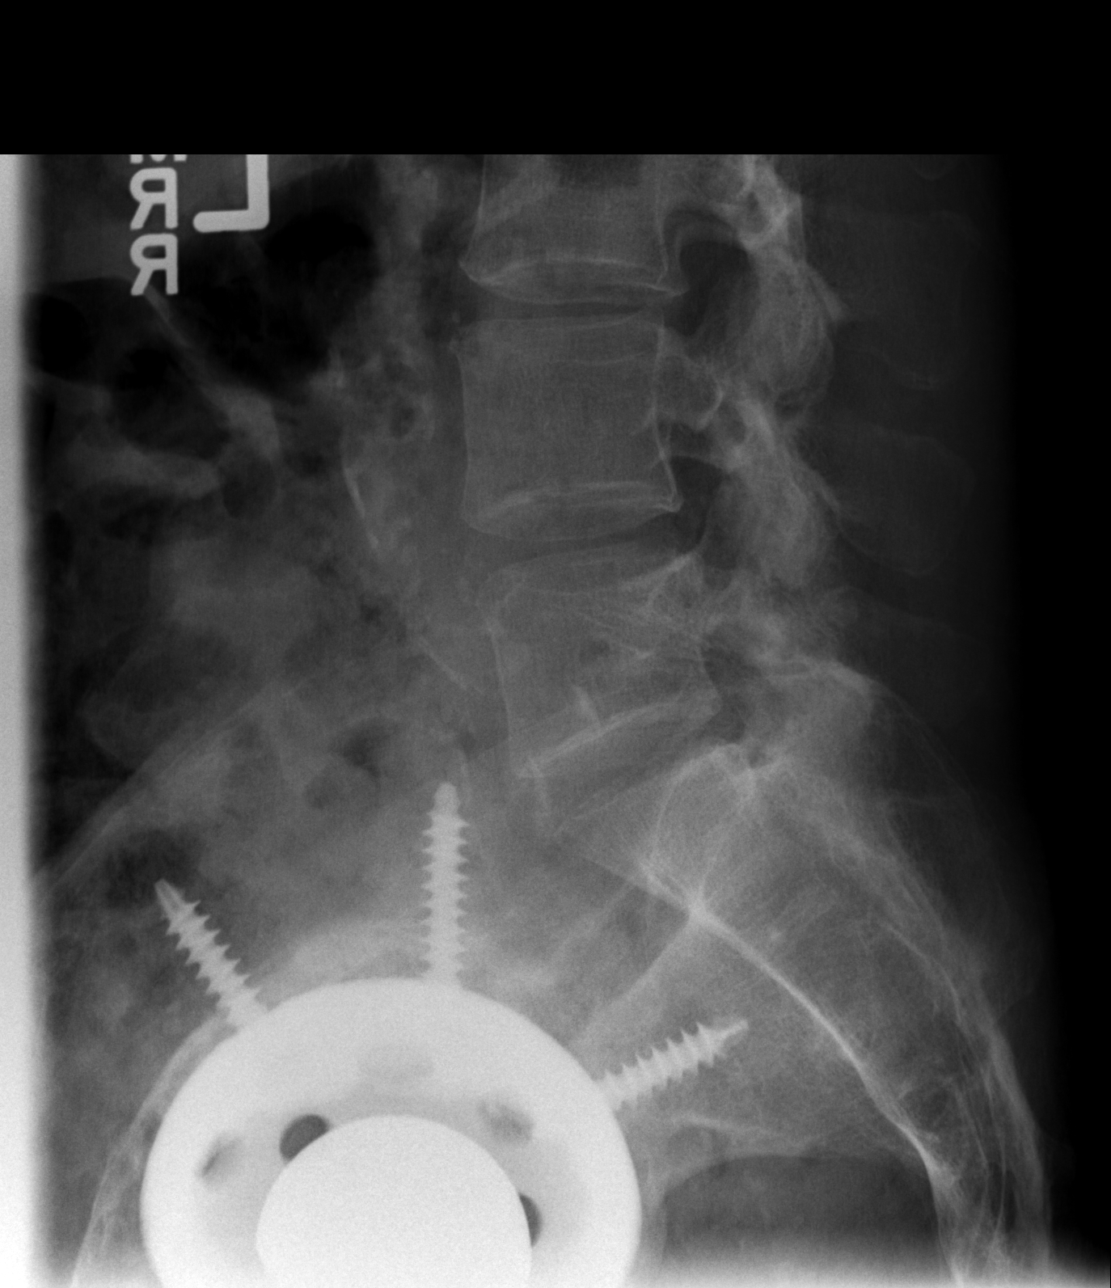

[5 of 5 positions shown; findings below may reference images not displayed]

FINDINGS: Normal lumbar segmentation with stable vertebral height
and alignment.  Stable, relatively preserved disc spaces.
Calcified atherosclerosis of the abdominal aorta re-identified.  No
pars fracture.  Moderate to severe L5-S1 facet degeneration again
noted.  Sequelae of right total hip arthroplasty with superimposed
suggestion of acetabular protrusio appears not significantly
changed.
IMPRESSION: No acute osseous abnormality in the lumbar spine.

## 2011-09-29 ENCOUNTER — Encounter: Payer: Self-pay | Admitting: Vascular Surgery

## 2011-09-30 ENCOUNTER — Encounter: Payer: Self-pay | Admitting: Vascular Surgery

## 2011-09-30 ENCOUNTER — Encounter (INDEPENDENT_AMBULATORY_CARE_PROVIDER_SITE_OTHER): Payer: No Typology Code available for payment source | Admitting: *Deleted

## 2011-09-30 ENCOUNTER — Ambulatory Visit (INDEPENDENT_AMBULATORY_CARE_PROVIDER_SITE_OTHER): Payer: No Typology Code available for payment source | Admitting: Vascular Surgery

## 2011-09-30 VITALS — BP 154/89 | HR 85 | Resp 16 | Ht 63.0 in | Wt 106.0 lb

## 2011-09-30 DIAGNOSIS — Z48812 Encounter for surgical aftercare following surgery on the circulatory system: Secondary | ICD-10-CM

## 2011-09-30 DIAGNOSIS — I739 Peripheral vascular disease, unspecified: Secondary | ICD-10-CM

## 2011-09-30 DIAGNOSIS — I70219 Atherosclerosis of native arteries of extremities with intermittent claudication, unspecified extremity: Secondary | ICD-10-CM

## 2011-09-30 NOTE — Progress Notes (Signed)
Patient is status post left-to-right femoral-femoral bypass in November 2011. She returns today for followup. She states she still has some occasional right leg pain but this is much improved since her bypass. She currently has some pain in her right leg after walking about a half a mile. 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 Some of this may be degenerative arthritis. She denies any rest pain or ulcerations on the feet.  Review of systems: Pulmonary she denies any shortness of breath  Cardiac: She denies any recent chest pain  Physical exam: Filed Vitals:   09/30/11 1104  BP: 154/89  Pulse: 85  Resp: 16  Height: 5\' 3"  (1.6 m)  Weight: 106 lb (48.081 kg)  SpO2: 100%    Lower extremities: She has 2+ femoral pulses bilaterally. She has a 1+ right posterior tibial pulse she has a 2+ left dorsalis pedis pulse groin incisions are well-healed bilaterally  She has no evidence of edema in the lower extremities she does have limb discrepancy right shorter than left  She had a graft duplex scan today which suggests he may be developing some narrowing above the fem-fem bypass graft in the left iliac artery. She had increased velocities of 366 cm/s. The graft was widely patent although he did have decreased flow her ABIs today were 0. 94 on the left and 0.94 on the right she had triphasic flow bilaterally. I reviewed and interpreted this study  Assessment/Plan: Patent femoral-femoral bypass with possible narrowing in the inflow artery. We will schedule her for an elective arteriogram to further evaluate this on 10/29/2011. Risks benefits possible palpitations and procedure details were explained the patient today

## 2011-10-05 IMAGING — CR DG CHEST 2V
2 series · 2 of 2 positions shown · non-contrast
Comparison: Chest x-ray 02/21/2009.

CLINICAL DATA: Peripheral vascular disease.  Preop respiratory
exam.

CHEST - 2 VIEW

[view not recorded (1 of 2)]
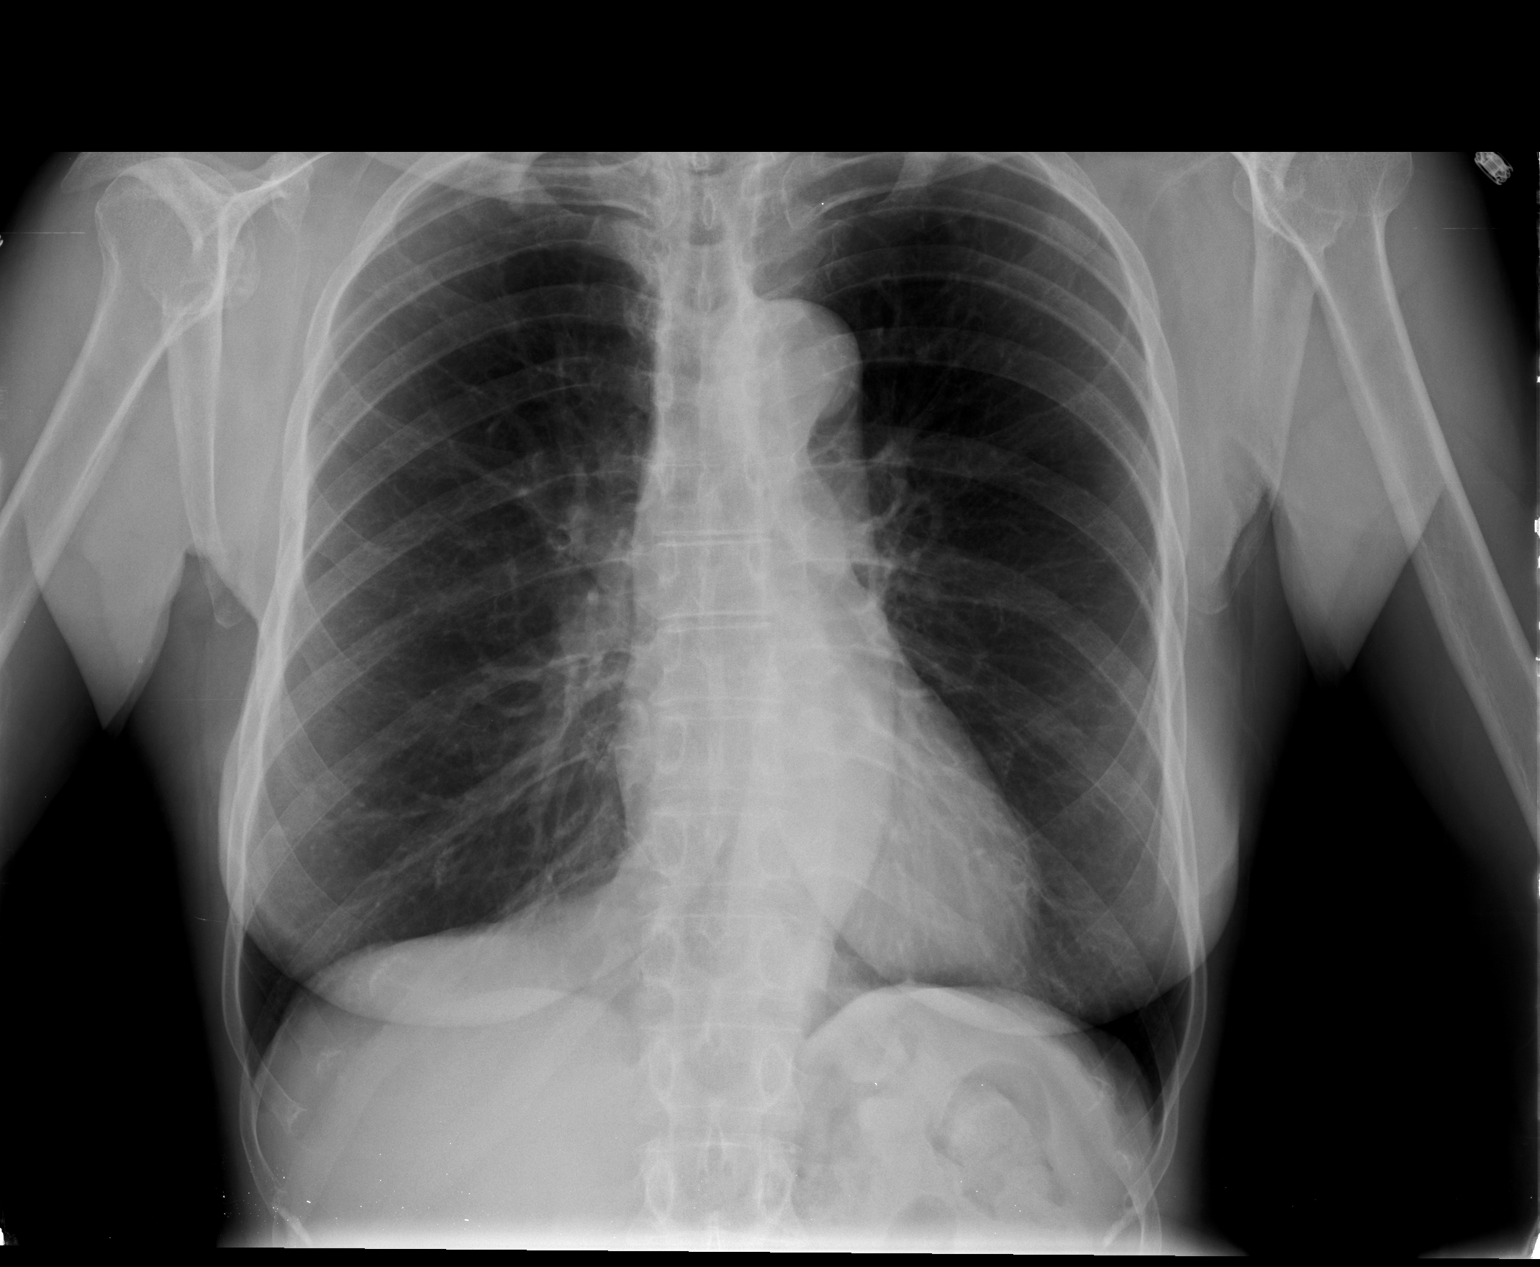

[view not recorded (2 of 2)]
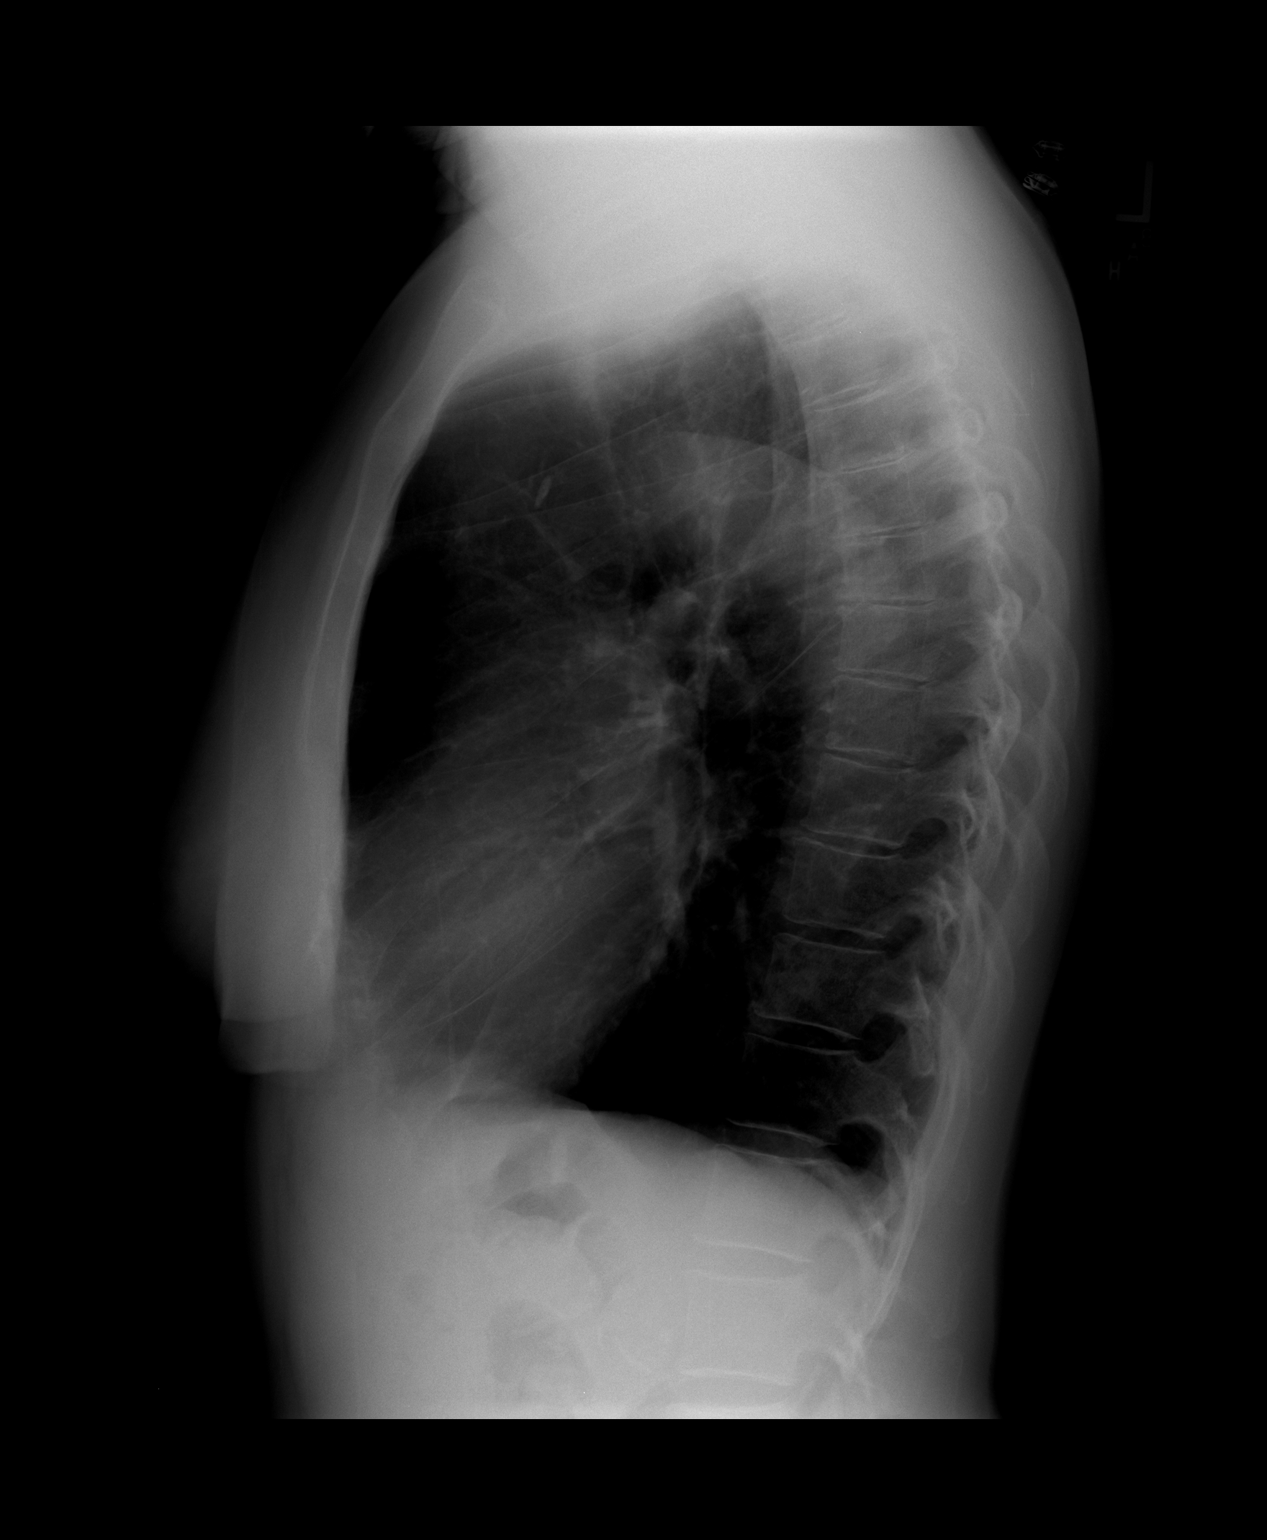

[2 of 2 positions shown; findings below may reference images not displayed]

FINDINGS: The cardiac silhouette, mediastinal and hilar contours
are within normal limits and stable.  The lungs demonstrate stable
changes of COPD.  No acute pulmonary findings but the bony thorax
is intact and appears stable.
IMPRESSION: 1.  No acute cardiopulmonary findings.
2.  Mild chronic lung changes/COPD.

## 2011-10-07 NOTE — Procedures (Unsigned)
BYPASS GRAFT EVALUATION  INDICATION:  Follow up left to right fem-fem graft.  HISTORY: Diabetes:  No. Cardiac:  No. Hypertension:  Yes. Smoking:  Yes. Previous Surgery:  Fem-fem graft placed 08/31/10.  SINGLE LEVEL ARTERIAL EXAM                              RIGHT              LEFT Brachial: Anterior tibial: Posterior tibial: Peroneal: Ankle/brachial index:        0.94               0.94  PREVIOUS ABI:  Date: 09/24/10  RIGHT:  1.02  LEFT:  0.93  LOWER EXTREMITY BYPASS GRAFT DUPLEX EXAM:  DUPLEX: 1. Widely patent left to right fem-fem graft with stenosis observed in     the left external iliac artery, which is the inflow for the graft. 2. Low velocities are observed within the graft with triphasic     waveforms in the in and outflow arteries. 3. Please see diagram for details.  IMPRESSION:  Patent left to right femoral-femoral graft, as described above.  ___________________________________________ Jessy Oto. Fields, MD  LT/MEDQ  D:  09/30/2011  T:  09/30/2011  Job:  [id number]

## 2011-10-08 ENCOUNTER — Other Ambulatory Visit: Payer: Self-pay

## 2011-10-14 ENCOUNTER — Encounter (HOSPITAL_COMMUNITY): Payer: Self-pay

## 2011-10-28 MED ORDER — SODIUM CHLORIDE 0.9 % IV SOLN
INTRAVENOUS | Status: DC
  Administered 2011-10-29: 09:00:00 via INTRAVENOUS

## 2011-10-29 ENCOUNTER — Encounter (HOSPITAL_COMMUNITY)
Admission: RE | Disposition: A | Discharge status: Home / Self Care | Payer: Self-pay | Source: Ambulatory Visit | Attending: Vascular Surgery

## 2011-10-29 ENCOUNTER — Ambulatory Visit (HOSPITAL_COMMUNITY)
Admission: RE | Admit: 2011-10-29 | Discharge: 2011-10-29 | Disposition: A | Discharge status: Home / Self Care | Payer: Medicare Other | Source: Ambulatory Visit | Attending: Vascular Surgery | Admitting: Vascular Surgery

## 2011-10-29 DIAGNOSIS — I70209 Unspecified atherosclerosis of native arteries of extremities, unspecified extremity: Secondary | ICD-10-CM | POA: Insufficient documentation

## 2011-10-29 DIAGNOSIS — I739 Peripheral vascular disease, unspecified: Secondary | ICD-10-CM

## 2011-10-29 HISTORY — PX: ABDOMINAL ANGIOGRAM: SHX5499

## 2011-10-29 SURGERY — ABDOMINAL ANGIOGRAM
Anesthesia: LOCAL

## 2011-10-29 MED ORDER — LABETALOL HCL 5 MG/ML IV SOLN: 10.0000 mg | INTRAVENOUS | Status: DC | PRN

## 2011-10-29 MED ORDER — OXYCODONE HCL 5 MG PO TABS: 5.0000 mg | ORAL_TABLET | ORAL | Status: DC | PRN

## 2011-10-29 MED ORDER — GUAIFENESIN-DM 100-10 MG/5ML PO SYRP: 15.0000 mL | ORAL_SOLUTION | ORAL | Status: DC | PRN

## 2011-10-29 MED ORDER — HEPARIN (PORCINE) IN NACL 2-0.9 UNIT/ML-% IJ SOLN
INTRAMUSCULAR | Status: AC
  Filled 2011-10-29: qty 1000

## 2011-10-29 MED ORDER — PHENOL 1.4 % MT LIQD: 1.0000 | OROMUCOSAL | Status: DC | PRN

## 2011-10-29 MED ORDER — ACETAMINOPHEN 325 MG PO TABS
ORAL_TABLET | ORAL | Status: AC
  Filled 2011-10-29: qty 2

## 2011-10-29 MED ORDER — SODIUM CHLORIDE 0.9 % IV SOLN: 500.0000 mL | Freq: Once | INTRAVENOUS | Status: DC | PRN

## 2011-10-29 MED ORDER — METOPROLOL TARTRATE 1 MG/ML IV SOLN: 2.0000 mg | INTRAVENOUS | Status: DC | PRN

## 2011-10-29 MED ORDER — ONDANSETRON HCL 4 MG/2ML IJ SOLN: 4.0000 mg | Freq: Four times a day (QID) | INTRAMUSCULAR | Status: DC | PRN

## 2011-10-29 MED ORDER — DOCUSATE SODIUM 100 MG PO CAPS: 100.0000 mg | ORAL_CAPSULE | Freq: Every day | ORAL | Status: DC

## 2011-10-29 MED ORDER — ACETAMINOPHEN 325 MG RE SUPP: 325.0000 mg | RECTAL | Status: DC | PRN

## 2011-10-29 MED ORDER — SODIUM CHLORIDE 0.45 % IV SOLN
INTRAVENOUS | Status: DC
  Administered 2011-10-29: 13:00:00 via INTRAVENOUS

## 2011-10-29 MED ORDER — HYDRALAZINE HCL 20 MG/ML IJ SOLN: 10.0000 mg | INTRAMUSCULAR | Status: DC | PRN

## 2011-10-29 MED ORDER — ACETAMINOPHEN 325 MG PO TABS
325.0000 mg | ORAL_TABLET | ORAL | Status: DC | PRN
  Administered 2011-10-29: 650 mg via ORAL

## 2011-10-29 MED ORDER — LIDOCAINE HCL (PF) 1 % IJ SOLN
INTRAMUSCULAR | Status: AC
  Filled 2011-10-29: qty 30

## 2011-10-29 NOTE — H&P (View-Only) (Signed)
Patient is status post left-to-right femoral-femoral bypass in November 2011. She returns today for followup. She states she still has some occasional right leg pain but this is much improved since her bypass. She currently has some pain in her right leg after walking about a half a mile. 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 Some of this may be degenerative arthritis. She denies any rest pain or ulcerations on the feet.  Review of systems: Pulmonary she denies any shortness of breath  Cardiac: She denies any recent chest pain  Physical exam: Filed Vitals:   09/30/11 1104  BP: 154/89  Pulse: 85  Resp: 16  Height: 5\' 3"  (1.6 m)  Weight: 106 lb (48.081 kg)  SpO2: 100%    Lower extremities: She has 2+ femoral pulses bilaterally. She has a 1+ right posterior tibial pulse she has a 2+ left dorsalis pedis pulse groin incisions are well-healed bilaterally  She has no evidence of edema in the lower extremities she does have limb discrepancy right shorter than left  She had a graft duplex scan today which suggests he may be developing some narrowing above the fem-fem bypass graft in the left iliac artery. She had increased velocities of 366 cm/s. The graft was widely patent although he did have decreased flow her ABIs today were 0. 94 on the left and 0.94 on the right she had triphasic flow bilaterally. I reviewed and interpreted this study  Assessment/Plan: Patent femoral-femoral bypass with possible narrowing in the inflow artery. We will schedule her for an elective arteriogram to further evaluate this on 10/29/2011. Risks benefits possible palpitations and procedure details were explained the patient today

## 2011-10-29 NOTE — Interval H&P Note (Signed)
History and Physical Interval Note:  10/29/2011 9:39 AM  Alyssa [name]  has presented today for surgery, with the diagnosis of r/o PVD  The various methods of treatment have been discussed with the patient and family. After consideration of risks, benefits and other options for treatment, the patient has consented to  Procedure(s): ABDOMINAL ANGIOGRAM as a surgical intervention .  The patients' history has been reviewed, patient examined, no change in status, stable for surgery.  I have reviewed the patients' chart and labs.  Questions were answered to the patient's satisfaction.     Lindzee Gouge E

## 2011-10-29 NOTE — Brief Op Note (Signed)
10/29/2011  12:12 PM  PATIENT:  Alyssa [name]  71 y.o. female  PRE-OPERATIVE DIAGNOSIS: Iliac stenosis  POST-OPERATIVE DIAGNOSIS: No stenosis  PROCEDURE:  Procedure(s): ABDOMINAL ANGIOGRAM with lower extremity runoff  SURGEON:  Surgeon(s): Elam Dutch, MD  PHYSICIAN ASSISTANT:   ASSISTANTS: none   ANESTHESIA:   local  Left femoral 5 Fr sheath   Ruta Hinds, MD [facility] Office: [phone] Pager: [phone]

## 2011-10-29 NOTE — Op Note (Signed)
Procedure: Aortogram with bilateral lower extremity runoff   Preoperative diagnosis: Left iliac stenosis  Postoperative diagnosis: No iliac stenosis  Anesthesia: Local  Procedure details: Obtaining informed consent, the patient was taken to the [facility] lab. The patient was placed in supine position the Angio table. Both groins were prepped and draped in usual sterile fashion. Local anesthesia was infiltrated over the left common femoral artery. An introducer needle was then used to cannulate the left common femoral artery without difficulty. An 85 Versacore wire was threaded up into the abdominal aorta under fluoroscopic guidance. A 5 French pigtail catheter was then placed over the guidewire into the abdominal aorta and abdominal aortogram was obtained. The right common iliac artery is occluded. The left common iliac artery is widely patent. There is some tapering of the artery distally at the distal external iliac common femoral artery but there is no significant stenosis. There is a patent left-to-right femoral-femoral bypass. Oblique views were also performed of the left groin area to confirm that there is no distal external iliac artery stenosis.   The left common femoral artery and the fem-fem anastomosis is patent with no significant inflow stenosis.  Next bilateral lower extremity runoff views were obtained. This again confirms the femoral-femoral bypass is patent. In the left lower extremity the left common femoral profunda femoris superficial femoral popliteal artery posterior tibial artery into tibial arteries are all patent  In the right lower extremity the right common femoral artery is patent. The right superficial femoral popliteal artery posterior tibial and anterior tibial arteries are all patent. Next the pigtail catheter was removed over a guidewire. The 5 French sheath was thoroughly flushed with heparinized saline. The patient was taken to the holding area in stable  condition.  Operative findings: Patent femoral-femoral bypass with no significant external iliac artery stenosis as suggested by preoperative duplex exam patent runoff bilateral lower extremities  Management: The patient will followup with me in 6 months time with repeat ABIs  Ruta Hinds, MD [facility]: [phone] Pager: [phone]

## 2011-11-01 ENCOUNTER — Other Ambulatory Visit: Payer: Self-pay | Admitting: *Deleted

## 2011-11-01 DIAGNOSIS — I70219 Atherosclerosis of native arteries of extremities with intermittent claudication, unspecified extremity: Secondary | ICD-10-CM

## 2011-11-01 LAB — POCT I-STAT, CHEM 8
BUN: 29 mg/dL — ABNORMAL HIGH (ref 6–23)
Calcium, Ion: 1.25 mmol/L (ref 1.12–1.32)
Chloride: 114 mEq/L — ABNORMAL HIGH (ref 96–112)
Creatinine, Ser: 1 mg/dL (ref 0.50–1.10)
Glucose, Bld: 115 mg/dL — ABNORMAL HIGH (ref 70–99)
HCT: 32 % — ABNORMAL LOW (ref 36.0–46.0)
Hemoglobin: 10.9 g/dL — ABNORMAL LOW (ref 12.0–15.0)
Potassium: 5 mEq/L (ref 3.5–5.1)
Sodium: 141 mEq/L (ref 135–145)
TCO2: 19 mmol/L (ref 0–100)

## 2011-11-02 ENCOUNTER — Other Ambulatory Visit: Payer: Self-pay | Admitting: Cardiology

## 2011-12-02 ENCOUNTER — Other Ambulatory Visit: Payer: Self-pay | Admitting: *Deleted

## 2011-12-02 DIAGNOSIS — Z1231 Encounter for screening mammogram for malignant neoplasm of breast: Secondary | ICD-10-CM

## 2011-12-03 ENCOUNTER — Other Ambulatory Visit (HOSPITAL_COMMUNITY): Payer: Self-pay | Admitting: *Deleted

## 2011-12-03 DIAGNOSIS — Z1231 Encounter for screening mammogram for malignant neoplasm of breast: Secondary | ICD-10-CM

## 2011-12-30 ENCOUNTER — Ambulatory Visit (HOSPITAL_COMMUNITY)
Admission: RE | Admit: 2011-12-30 | Discharge: 2011-12-30 | Disposition: A | Discharge status: Home / Self Care | Payer: No Typology Code available for payment source | Source: Ambulatory Visit | Attending: *Deleted | Admitting: *Deleted

## 2011-12-30 DIAGNOSIS — Z1231 Encounter for screening mammogram for malignant neoplasm of breast: Secondary | ICD-10-CM

## 2012-04-26 ENCOUNTER — Encounter: Payer: Self-pay | Admitting: Neurosurgery

## 2012-04-27 ENCOUNTER — Ambulatory Visit (INDEPENDENT_AMBULATORY_CARE_PROVIDER_SITE_OTHER): Payer: Medicare Other | Admitting: Neurosurgery

## 2012-04-27 ENCOUNTER — Encounter: Payer: Self-pay | Admitting: Neurosurgery

## 2012-04-27 ENCOUNTER — Encounter (INDEPENDENT_AMBULATORY_CARE_PROVIDER_SITE_OTHER): Payer: Medicare Other | Admitting: *Deleted

## 2012-04-27 VITALS — BP 154/94 | HR 64 | Ht 63.0 in | Wt 107.8 lb

## 2012-04-27 DIAGNOSIS — I70219 Atherosclerosis of native arteries of extremities with intermittent claudication, unspecified extremity: Secondary | ICD-10-CM

## 2012-04-27 DIAGNOSIS — Z48812 Encounter for surgical aftercare following surgery on the circulatory system: Secondary | ICD-10-CM

## 2012-04-27 DIAGNOSIS — I739 Peripheral vascular disease, unspecified: Secondary | ICD-10-CM

## 2012-04-27 NOTE — Addendum Note (Signed)
Addended by: Mena Goes on: 04/27/2012 12:48 PM   Modules accepted: Orders

## 2012-04-27 NOTE — Progress Notes (Signed)
[facility] Office Note  CC: Annual surveillance ABIs Referring Physician: Fields  History of Present Illness: 71 year old female patient of Dr. fields who status post a fem-fem bypass graft in November 2011. Patient reports mild claudication but great improvement since her surgery. The patient denies any rest pain or any open ulcerations. The patient also denies any new medical diagnoses or recent surgeries.  Past Medical History  Diagnosis Date  . Hyperlipidemia   . Hypertension   . Leg pain   . Joint pain   . Headache   . Anxiety   . Arthritis     ROS: [x]  Positive   [ ]  Denies    General: [ ]  Weight loss, [ ]  Fever, [ ]  chills Neurologic: [ ]  Dizziness, [ ]  Blackouts, [ ]  Seizure [ ]  Stroke, [ ]  "Mini stroke", [ ]  Slurred speech, [ ]  Temporary blindness; [ ]  weakness in arms or legs, [ ]  Hoarseness Cardiac: [ ]  Chest pain/pressure, [ ]  Shortness of breath at rest [ ]  Shortness of breath with exertion, [ ]  Atrial fibrillation or irregular heartbeat Vascular: [ ]  Pain in legs with walking, [ ]  Pain in legs at rest, [ ]  Pain in legs at night,  [ ]  Non-healing ulcer, [ ]  Blood clot in vein/DVT,   Pulmonary: [ ]  Home oxygen, [ ]  Productive cough, [ ]  Coughing up blood, [ ]  Asthma,  [ ]  Wheezing Musculoskeletal:  [ ]  Arthritis, [ ]  Low back pain, [ ]  Joint pain Hematologic: [ ]  Easy Bruising, [ ]  Anemia; [ ]  Hepatitis Gastrointestinal: [ ]  Blood in stool, [ ]  Gastroesophageal Reflux/heartburn, [ ]  Trouble swallowing Urinary: [ ]  chronic Kidney disease, [ ]  on HD - [ ]  MWF or [ ]  TTHS, [ ]  Burning with urination, [ ]  Difficulty urinating Skin: [ ]  Rashes, [ ]  Wounds Psychological: [ ]  Anxiety, [ ]  Depression   Social History History  Substance Use Topics  . Smoking status: Current Everyday Smoker -- 0.5 packs/day    Types: Cigarettes  . Smokeless tobacco: Not on file  . Alcohol Use: No    Family History Family History  Problem  Relation Age of Onset  . Heart attack Mother   . Heart attack Father   . Cancer Sister   . Aneurysm Brother   . Cancer Brother     No Known Allergies  Current Outpatient Prescriptions  Medication Sig Dispense Refill  . AMLODIPINE BESYLATE PO Take 25 mg by mouth daily.      . benazepril-hydrochlorthiazide (LOTENSIN HCT) 10-12.5 MG per tablet Take 10 tablets by mouth daily.      . busPIRone (BUSPAR) 15 MG tablet Take 15 mg by mouth as needed.      . citalopram (CELEXA) 20 MG tablet Take 20 mg by mouth daily.        Marland Kitchen gabapentin (NEURONTIN) 300 MG capsule Take 300 mg by mouth 2 (two) times daily.        Marland Kitchen ibandronate (BONIVA) 150 MG tablet Take 150 mg by mouth every 30 (thirty) days. Take in the morning with a full glass of water, on an empty stomach, and do not take anything else by mouth or lie down for the next 30 min.       . Iron-Vitamins (GERITOL PO) Take 1 tablet by mouth daily.        Marland Kitchen lithium carbonate 300 MG capsule Take 300 mg by mouth daily.        Marland Kitchen  LORazepam (ATIVAN) 1 MG tablet Take 1 mg by mouth 3 (three) times daily as needed. For anxiety.      . rosuvastatin (CRESTOR) 10 MG tablet Take 10 mg by mouth daily.        Marland Kitchen zolpidem (AMBIEN) 10 MG tablet Take 10 mg by mouth at bedtime as needed.      Marland Kitchen amLODipine-valsartan (EXFORGE) 10-320 MG per tablet Take 1 tablet by mouth daily.         Physical Examination  Filed Vitals:   04/27/12 1053  BP: 154/94  Pulse: 64    Body mass index is 19.10 kg/(m^2).  General:  WDWN in NAD Gait: Normal HEENT: WNL Eyes: Pupils equal Pulmonary: normal non-labored breathing , without Rales, rhonchi,  wheezing Cardiac: RRR, without  Murmurs, rubs or gallops; No carotid bruits Abdomen: soft, NT, no masses Skin: no rashes, ulcers noted Vascular Exam/Pulses: Patient has palpable PT and DP pulses bilaterally, radial pulses are 2+  Extremities without ischemic changes, no Gangrene , no cellulitis; no open wounds;  Musculoskeletal: no  muscle wasting or atrophy  Neurologic: A&O X 3; Appropriate Affect ; SENSATION: normal; MOTOR FUNCTION:  moving all extremities equally. Speech is fluent/normal  Non-Invasive Vascular Imaging: ABIs today are 0.96 and triphasic to biphasic on the right, 0.95 and triphasic to biphasic on the left  ASSESSMENT/PLAN: Is a patient with mild claudication does state she can tolerate the mild pain she has as long as she has good blood flow to the lower extremities. We will continue her on surveillance annually with ABIs. The patient knows to call the office if she has difficulties in the interim. Her questions were encouraged and answered.   Beatris Ship ANP  Clinic M.D.: Fields

## 2012-05-15 ENCOUNTER — Emergency Department (HOSPITAL_COMMUNITY): Payer: Medicare Other

## 2012-05-15 ENCOUNTER — Encounter (HOSPITAL_COMMUNITY): Payer: Self-pay | Admitting: Emergency Medicine

## 2012-05-15 ENCOUNTER — Emergency Department (HOSPITAL_COMMUNITY)
Admission: EM | Admit: 2012-05-15 | Discharge: 2012-05-15 | Disposition: A | Discharge status: Home / Self Care | Payer: Medicare Other | Attending: Emergency Medicine | Admitting: Emergency Medicine

## 2012-05-15 DIAGNOSIS — W19XXXA Unspecified fall, initial encounter: Secondary | ICD-10-CM | POA: Insufficient documentation

## 2012-05-15 DIAGNOSIS — F411 Generalized anxiety disorder: Secondary | ICD-10-CM | POA: Insufficient documentation

## 2012-05-15 DIAGNOSIS — M25559 Pain in unspecified hip: Secondary | ICD-10-CM | POA: Insufficient documentation

## 2012-05-15 DIAGNOSIS — M5430 Sciatica, unspecified side: Secondary | ICD-10-CM | POA: Insufficient documentation

## 2012-05-15 DIAGNOSIS — F17200 Nicotine dependence, unspecified, uncomplicated: Secondary | ICD-10-CM | POA: Insufficient documentation

## 2012-05-15 DIAGNOSIS — S7000XA Contusion of unspecified hip, initial encounter: Secondary | ICD-10-CM | POA: Insufficient documentation

## 2012-05-15 DIAGNOSIS — Z8739 Personal history of other diseases of the musculoskeletal system and connective tissue: Secondary | ICD-10-CM | POA: Insufficient documentation

## 2012-05-15 DIAGNOSIS — I10 Essential (primary) hypertension: Secondary | ICD-10-CM | POA: Insufficient documentation

## 2012-05-15 DIAGNOSIS — Z79899 Other long term (current) drug therapy: Secondary | ICD-10-CM | POA: Insufficient documentation

## 2012-05-15 DIAGNOSIS — E785 Hyperlipidemia, unspecified: Secondary | ICD-10-CM | POA: Insufficient documentation

## 2012-05-15 MED ORDER — ACETAMINOPHEN 325 MG PO TABS
650.0000 mg | ORAL_TABLET | Freq: Once | ORAL | Status: AC
  Administered 2012-05-15: 650 mg via ORAL
  Filled 2012-05-15: qty 2

## 2012-05-15 MED ORDER — PREDNISONE 10 MG PO TABS: 40.0000 mg | ORAL_TABLET | Freq: Every day | ORAL | Status: DC

## 2012-05-15 MED ORDER — PREDNISONE 20 MG PO TABS
40.0000 mg | ORAL_TABLET | Freq: Once | ORAL | Status: AC
  Administered 2012-05-15: 40 mg via ORAL
  Filled 2012-05-15: qty 2

## 2012-05-15 MED ORDER — TRAMADOL HCL 50 MG PO TABS: 50.0000 mg | ORAL_TABLET | Freq: Three times a day (TID) | ORAL | Status: AC | PRN

## 2012-05-15 NOTE — ED Notes (Signed)
Fell 1 week ago hit floor had carpet  On it   Fell on left side every since  Left hip pain radiation down entire leg to calf, calf is not swollen or red

## 2012-05-15 NOTE — ED Notes (Signed)
Pt presenting to ed with c/o falling last week and having left hip/buttock pain that radiates into her left leg. Pt denies any upper body pain at this time. Pt denies loc pt states she did not present to ed last week because she thought pain would get better but it's worse

## 2012-05-15 NOTE — ED Provider Notes (Signed)
History     CSN: [id number]  Arrival date & time 05/15/12  1627   None     Chief Complaint  Patient presents with  . Fall  . Hip Pain    (Consider location/radiation/quality/duration/timing/severity/associated sxs/prior treatment) HPI  Patient presents to the emergency department with complaints of left hip pain. She states that one week ago she fell on her left hip. She did not get seen at that time because she definitely get better however she's getting a shooting pain going from her left hip down to her knee. She denies hitting her head, injuring her neck, or having pain anywhere else. She denies being on blood thinners. Patient says that she is able to ambulate without any problems she said she is just naturally slow because she is old. NAD/VSS  Past Medical History  Diagnosis Date  . Hyperlipidemia   . Hypertension   . Leg pain   . Joint pain   . Headache   . Anxiety   . Arthritis     Past Surgical History  Procedure Date  . Total hip arthroplasty     right  . 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 Heart attack Mother   . Heart attack Father   . Cancer Sister   . Aneurysm Brother   . Cancer Brother     History  Substance Use Topics  . Smoking status: Current Everyday Smoker -- 0.5 packs/day    Types: Cigarettes  . Smokeless tobacco: Not on file  . Alcohol Use: No    OB History    Grav Para Term Preterm Abortions TAB SAB Ect Mult Living                  Review of Systems   HEENT: denies blurry vision or change in hearing PULMONARY: Denies difficulty breathing and SOB CARDIAC: denies chest pain or heart palpitations MUSCULOSKELETAL:  denies being unable to ambulate ABDOMEN AL: denies abdominal pain GU: denies loss of bowel or urinary control NEURO: denies numbness and tingling in extremities SKIN: no new rashes PSYCH: patient denies anxiety or depression. NECK: Pt denies having neck  pain     Allergies  Review of patient's allergies indicates no known allergies.  Home Medications   Current Outpatient Rx  Name Route Sig Dispense Refill  . AMLODIPINE BESYLATE 10 MG PO TABS Oral Take 10 mg by mouth daily.    . BUSPIRONE HCL 15 MG PO TABS Oral Take 15 mg by mouth 2 (two) times daily as needed. For anxiety.    Marland Kitchen CITALOPRAM HYDROBROMIDE 40 MG PO TABS Oral Take 40 mg by mouth daily.    Marland Kitchen HYDROCHLOROTHIAZIDE 25 MG PO TABS Oral Take 25 mg by mouth daily.    Marland Kitchen GERITOL PO Oral Take 1 tablet by mouth daily.      Marland Kitchen LITHIUM CARBONATE 300 MG PO CAPS Oral Take 300 mg by mouth daily.      Marland Kitchen ROSUVASTATIN CALCIUM 10 MG PO TABS Oral Take 10 mg by mouth daily.      . SUMATRIPTAN SUCCINATE 100 MG PO TABS Oral Take 100 mg by mouth every 2 (two) hours as needed. For migraine    . ZOLPIDEM TARTRATE 10 MG PO TABS Oral Take 10 mg by mouth at bedtime as needed. For sleep    . PREDNISONE 10 MG PO TABS Oral Take 4 tablets (40 mg total) by mouth daily. 10 tablet 0  .  TRAMADOL HCL 50 MG PO TABS Oral Take 1 tablet (50 mg total) by mouth every 8 (eight) hours as needed for pain. 12 tablet 0    BP 137/83  Pulse 63  Temp 98.8 F (37.1 C) (Oral)  Resp 20  SpO2 100%  Physical Exam  Nursing note and vitals reviewed. Constitutional: She appears well-developed and well-nourished. No distress.  HENT:  Head: Normocephalic and atraumatic.  Eyes: Pupils are equal, round, and reactive to light.  Neck: Normal range of motion. Neck supple.  Cardiovascular: Normal rate and regular rhythm.   Pulmonary/Chest: Effort normal.  Abdominal: Soft.  Musculoskeletal:       Right hip: She exhibits normal range of motion, normal strength, no tenderness, no bony tenderness, no swelling, no crepitus, no deformity and no laceration.       Legs: Neurological: She is alert.  Skin: Skin is warm and dry.    ED Course  Procedures (including critical care time)  Labs Reviewed - No data to display Dg Hip Complete  Left  05/15/2012  *RADIOLOGY REPORT*  Clinical Data: Hip pain post fall 1 week ago  LEFT HIP - COMPLETE 2+ VIEW  Comparison: None.  Findings: Three views of the left hip submitted.  No acute fracture or subluxation.  There is diffuse osteopenia.  There is right hip prosthesis in anatomic alignment.  Old fractures of the right superior and inferior pubic ramus.  IMPRESSION: No left hip fracture or subluxation.  Diffuse osteopenia right hip prosthesis. Old fractures of the right superior and inferior pubic ramus.  Original Report Authenticated By: Lahoma Crocker, M.D.   Dg Tibia/fibula Left  05/15/2012  *RADIOLOGY REPORT*  Clinical Data: [place] 1 week ago.  Hip pain.  Posterior calf pain.  LEFT TIBIA AND FIBULA - 2 VIEW  Comparison: Views of the left hip 05/15/2012  Findings: There is degenerative change of the knee, with associated chondrocalcinosis.  There is no evidence for acute fracture or subluxation.  Vascular calcifications are present.  IMPRESSION:  1.  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 2. No evidence for acute  abnormality.  Original Report Authenticated By: Glenice Bow, M.D.     1. Contusion, hip   2. Sciatica       MDM   Patient with hip pain. No neurological deficits. Patient is ambulatory. No warning symptoms of hip pain including: loss of bowel or bladder control, night sweats, waking from sleep with back pain, unexplained fevers or weight loss, h/o cancer, IVDU. No concern for cauda equina, epidural abscess, or other serious cause of  pain. Conservative measures such as rest, ice/heat and pain medicine indicated with Orthopedic  follow-up.     Linus Mako, PA 05/15/12 1906

## 2012-05-17 NOTE — ED Provider Notes (Signed)
Medical screening examination/treatment/procedure(s) were performed by non-physician practitioner and as supervising physician I was immediately available for consultation/collaboration.  Barbara Cower, MD 05/17/12 [phone]

## 2012-08-21 IMAGING — CR DG CHEST 2V
2 series · 2 of 2 positions shown · non-contrast
Comparison: 08/27/2010.

CLINICAL DATA: Chest pain.

CHEST - 2 VIEW

[w chest pa]
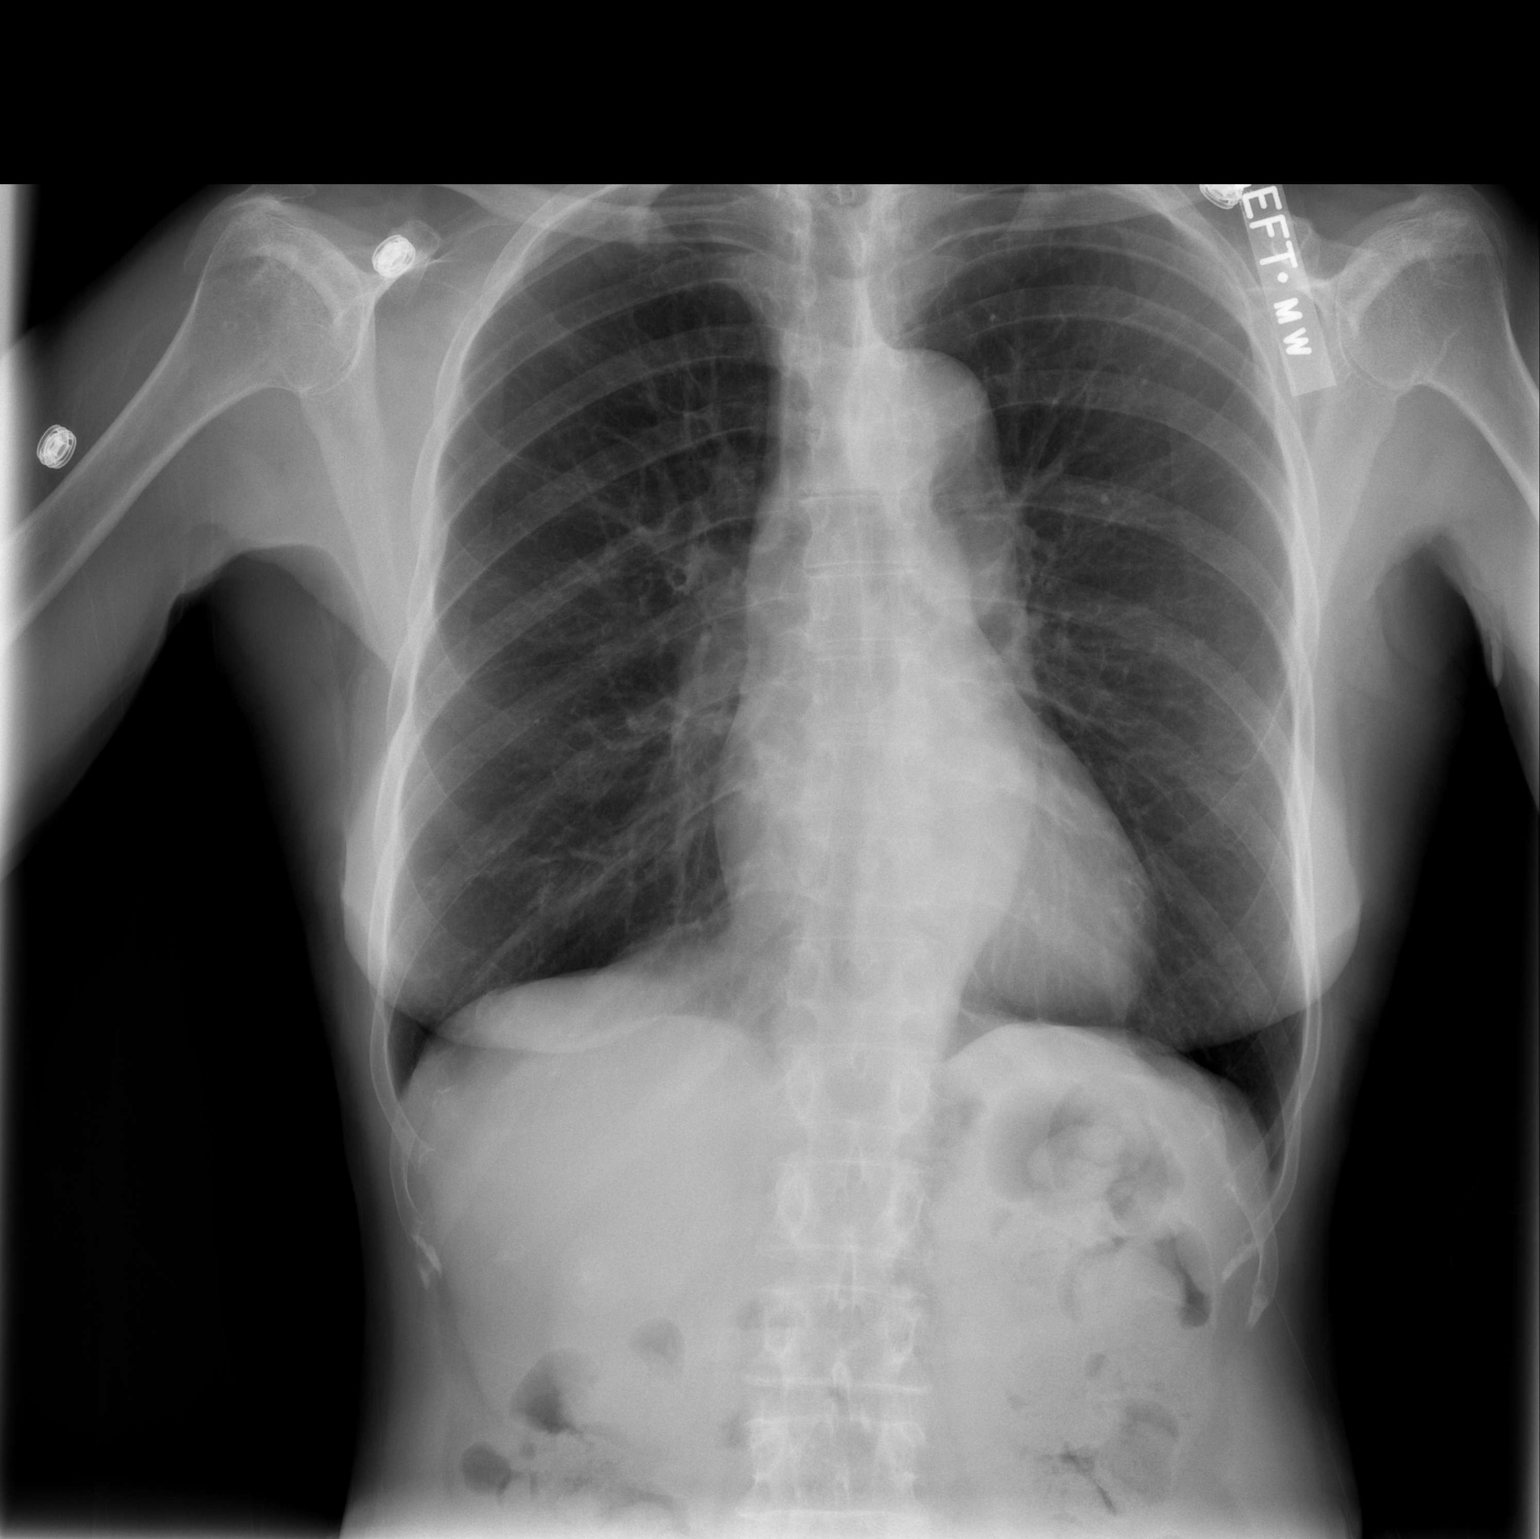

[w chest lat]
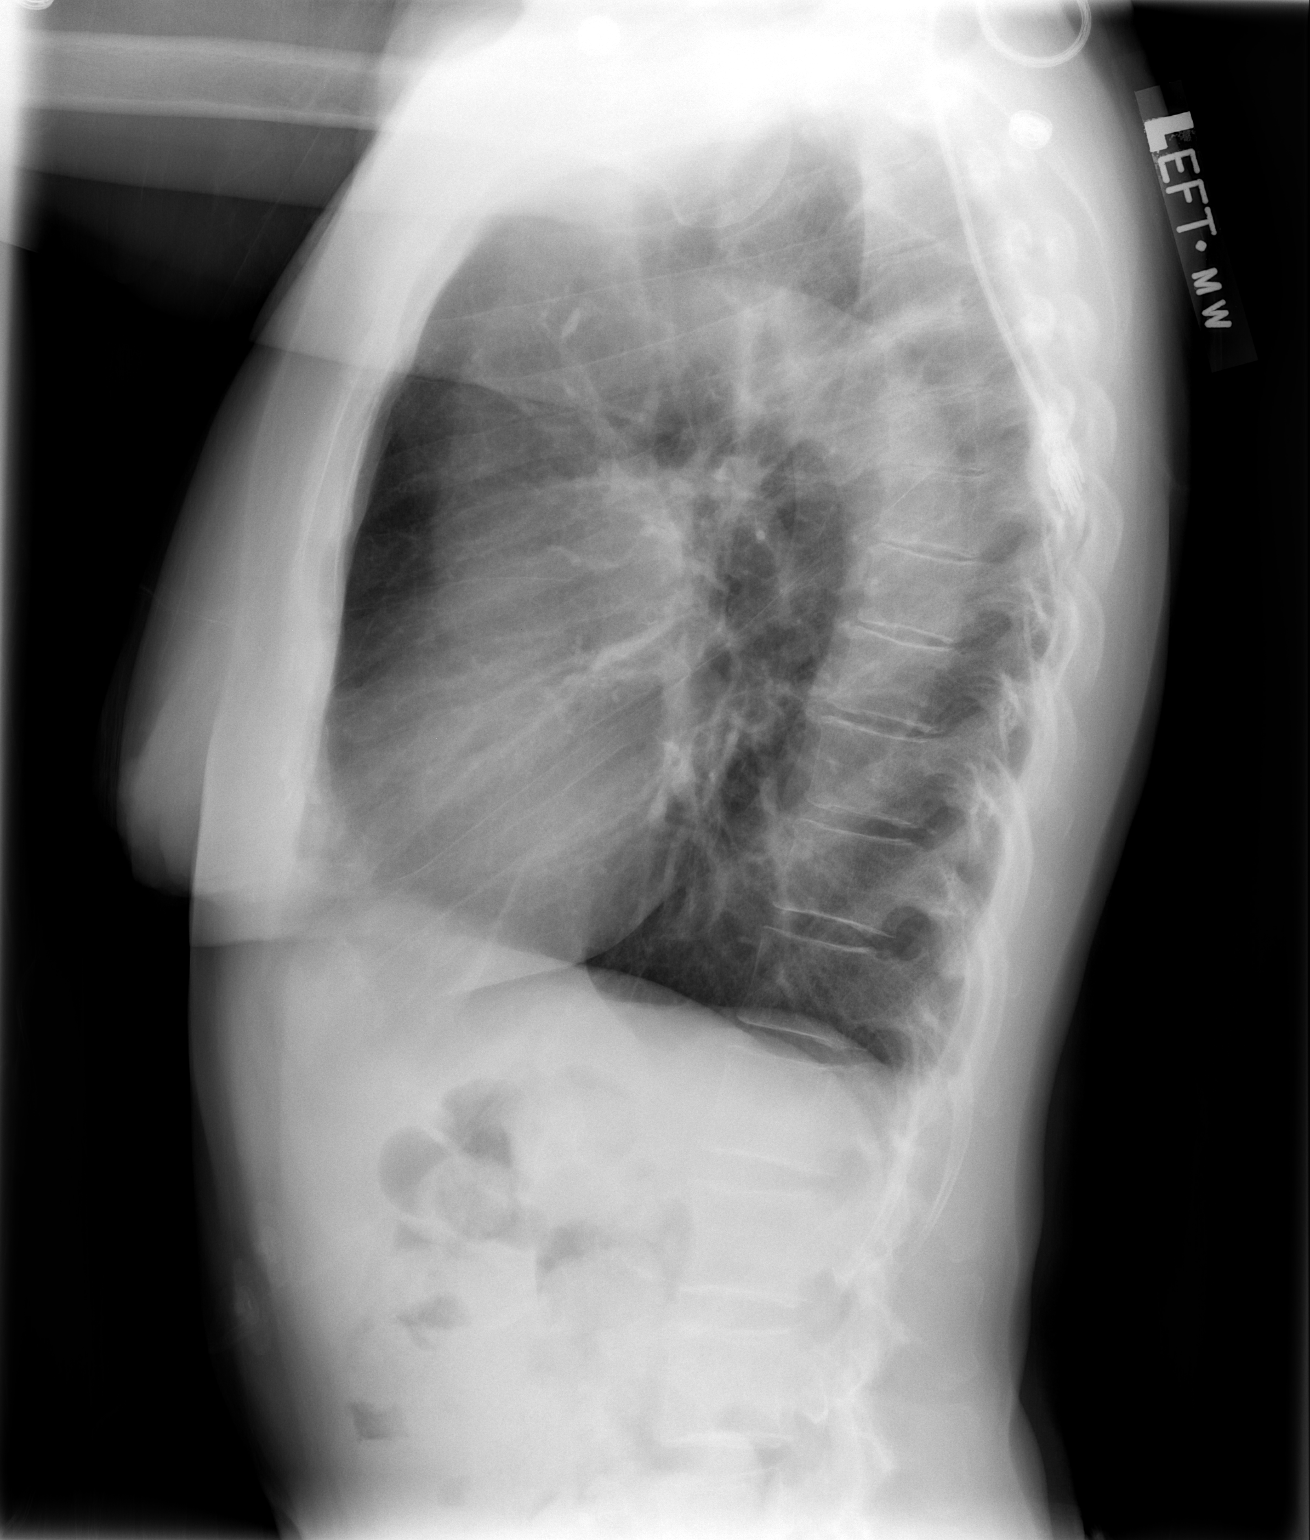

[2 of 2 positions shown; findings below may reference images not displayed]

FINDINGS: Trachea is midline.  Heart size normal.  Biapical pleural
parenchymal scarring.  Lungs are hyperinflated but otherwise clear.
No pleural fluid.  Degenerative changes are seen in the spine.
IMPRESSION: COPD.  No acute findings.

## 2012-09-12 ENCOUNTER — Encounter (HOSPITAL_COMMUNITY): Payer: Self-pay | Admitting: Emergency Medicine

## 2012-09-12 ENCOUNTER — Emergency Department (HOSPITAL_COMMUNITY)
Admission: EM | Admit: 2012-09-12 | Discharge: 2012-09-12 | Disposition: A | Discharge status: Home / Self Care | Payer: Medicare Other | Attending: Emergency Medicine | Admitting: Emergency Medicine

## 2012-09-12 ENCOUNTER — Emergency Department (HOSPITAL_COMMUNITY): Payer: Medicare Other

## 2012-09-12 DIAGNOSIS — M129 Arthropathy, unspecified: Secondary | ICD-10-CM | POA: Insufficient documentation

## 2012-09-12 DIAGNOSIS — R5383 Other fatigue: Secondary | ICD-10-CM | POA: Insufficient documentation

## 2012-09-12 DIAGNOSIS — F411 Generalized anxiety disorder: Secondary | ICD-10-CM | POA: Insufficient documentation

## 2012-09-12 DIAGNOSIS — R55 Syncope and collapse: Secondary | ICD-10-CM | POA: Insufficient documentation

## 2012-09-12 DIAGNOSIS — E785 Hyperlipidemia, unspecified: Secondary | ICD-10-CM | POA: Insufficient documentation

## 2012-09-12 DIAGNOSIS — F17200 Nicotine dependence, unspecified, uncomplicated: Secondary | ICD-10-CM | POA: Insufficient documentation

## 2012-09-12 DIAGNOSIS — G43909 Migraine, unspecified, not intractable, without status migrainosus: Secondary | ICD-10-CM | POA: Insufficient documentation

## 2012-09-12 DIAGNOSIS — I10 Essential (primary) hypertension: Secondary | ICD-10-CM | POA: Insufficient documentation

## 2012-09-12 DIAGNOSIS — E860 Dehydration: Secondary | ICD-10-CM

## 2012-09-12 DIAGNOSIS — R5381 Other malaise: Secondary | ICD-10-CM | POA: Insufficient documentation

## 2012-09-12 DIAGNOSIS — Z79899 Other long term (current) drug therapy: Secondary | ICD-10-CM | POA: Insufficient documentation

## 2012-09-12 DIAGNOSIS — R531 Weakness: Secondary | ICD-10-CM

## 2012-09-12 LAB — CBC WITH DIFFERENTIAL/PLATELET
Basophils Absolute: 0.1 10*3/uL (ref 0.0–0.1)
Basophils Relative: 1 % (ref 0–1)
Eosinophils Absolute: 0.5 10*3/uL (ref 0.0–0.7)
Eosinophils Relative: 6 % — ABNORMAL HIGH (ref 0–5)
HCT: 35 % — ABNORMAL LOW (ref 36.0–46.0)
Hemoglobin: 12 g/dL (ref 12.0–15.0)
Lymphocytes Relative: 26 % (ref 12–46)
Lymphs Abs: 2.4 10*3/uL (ref 0.7–4.0)
MCH: 29.4 pg (ref 26.0–34.0)
MCHC: 34.3 g/dL (ref 30.0–36.0)
MCV: 85.8 fL (ref 78.0–100.0)
Monocytes Absolute: 0.5 10*3/uL (ref 0.1–1.0)
Monocytes Relative: 5 % (ref 3–12)
Neutro Abs: 5.8 10*3/uL (ref 1.7–7.7)
Neutrophils Relative %: 63 % (ref 43–77)
Platelets: 349 10*3/uL (ref 150–400)
RBC: 4.08 MIL/uL (ref 3.87–5.11)
RDW: 15.3 % (ref 11.5–15.5)
WBC: 9.2 10*3/uL (ref 4.0–10.5)

## 2012-09-12 LAB — BASIC METABOLIC PANEL
BUN: 27 mg/dL — ABNORMAL HIGH (ref 6–23)
CO2: 21 mEq/L (ref 19–32)
Calcium: 10.1 mg/dL (ref 8.4–10.5)
Chloride: 103 mEq/L (ref 96–112)
Creatinine, Ser: 1.47 mg/dL — ABNORMAL HIGH (ref 0.50–1.10)
GFR calc Af Amer: 41 mL/min — ABNORMAL LOW (ref >90)
GFR calc non Af Amer: 35 mL/min — ABNORMAL LOW (ref >90)
Glucose, Bld: 105 mg/dL — ABNORMAL HIGH (ref 70–99)
Potassium: 5.1 mEq/L (ref 3.5–5.1)
Sodium: 133 mEq/L — ABNORMAL LOW (ref 135–145)

## 2012-09-12 LAB — URINE MICROSCOPIC-ADD ON

## 2012-09-12 LAB — URINALYSIS, ROUTINE W REFLEX MICROSCOPIC
Bilirubin Urine: NEGATIVE
Glucose, UA: NEGATIVE mg/dL
Hgb urine dipstick: NEGATIVE
Ketones, ur: NEGATIVE mg/dL
Leukocytes, UA: NEGATIVE
Nitrite: NEGATIVE
Protein, ur: 30 mg/dL — AB
Specific Gravity, Urine: 1.014 (ref 1.005–1.030)
Urobilinogen, UA: 1 mg/dL (ref 0.0–1.0)
pH: 6 (ref 5.0–8.0)

## 2012-09-12 LAB — GLUCOSE, CAPILLARY: Glucose-Capillary: 95 mg/dL (ref 70–99)

## 2012-09-12 LAB — TROPONIN I: Troponin I: <0.3 ng/mL (ref <0.30)

## 2012-09-12 LAB — LITHIUM LEVEL: Lithium Lvl: 1.17 mEq/L (ref 0.80–1.40)

## 2012-09-12 MED ORDER — SODIUM CHLORIDE 0.9 % IV BOLUS (SEPSIS)
1000.0000 mL | Freq: Once | INTRAVENOUS | Status: AC
  Administered 2012-09-12: 1000 mL via INTRAVENOUS

## 2012-09-12 NOTE — ED Notes (Signed)
ED EKG and Old EKG given to Dr. Darl Householder.

## 2012-09-12 NOTE — ED Notes (Signed)
Pt c/o dizziness upon standing x 2 days and generalized weakness and pt sts trembling and shaky at times

## 2012-09-12 NOTE — ED Notes (Signed)
Pt denies pain; pt denies dizziness, lightheadedness and nausea currently. Pt mentating appropriately.

## 2012-09-12 NOTE — ED Notes (Signed)
Dr. Yao at bedside. 

## 2012-09-12 NOTE — ED Notes (Signed)
Pt c/o dizziness and lightheaded for about 2 days; pt states "fell out" two days ago; pt denies known injury after fall. Pt currently denies nausea and shortness of breath. Pt mentating appropriately.

## 2012-09-12 NOTE — ED Notes (Signed)
Pt denies pain; pt states not dizzy or lightheaded currently; denies nausea. Pt mentating appropriately.

## 2012-09-12 NOTE — ED Provider Notes (Signed)
History     CSN: [id number]  Arrival date & time 09/12/12  1407   First MD Initiated Contact with Patient 09/12/12 1433      Chief Complaint  Patient presents with  . Dizziness  . Weakness    (Consider location/radiation/quality/duration/timing/severity/associated sxs/prior treatment) The history is provided by the patient.  Alyssa [name] is a 71 y.o. female hx of HL, HTN, migraines here with dizziness, weakness and syncope. She felt dizzy when she stood up for the last 2 days. She also had some generalized weakness. She passed out yesterday after standing up. Has been eating well. She also feels shaky in her hands in the past few days. No fever or chills or chest pain or headaches or abdominal pain.    Past Medical History  Diagnosis Date  . Hyperlipidemia   . Hypertension   . Leg pain   . Joint pain   . Headache   . Anxiety   . Arthritis     Past Surgical History  Procedure Date  . Total hip arthroplasty     right  . 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 Heart attack Mother   . Heart attack Father   . Cancer Sister   . Aneurysm Brother   . Cancer Brother     History  Substance Use Topics  . Smoking status: Current Every Day Smoker -- 0.5 packs/day    Types: Cigarettes  . Smokeless tobacco: Not on file  . Alcohol Use: No    OB History    Grav Para Term Preterm Abortions TAB SAB Ect Mult Living                  Review of Systems  Neurological: Positive for tremors, syncope and weakness.  All other systems reviewed and are negative.    Allergies  Review of patient's allergies indicates no known allergies.  Home Medications   Current Outpatient Rx  Name  Route  Sig  Dispense  Refill  . ACETAMINOPHEN 500 MG PO TABS   Oral   Take 1,000 mg by mouth every 6 (six) hours as needed. For pain         . AMLODIPINE BESYLATE 10 MG PO TABS   Oral   Take 10 mg by mouth daily.         .  BUSPIRONE HCL 15 MG PO TABS   Oral   Take 15 mg by mouth 2 (two) times daily as needed. For anxiety.         Marland Kitchen CITALOPRAM HYDROBROMIDE 40 MG PO TABS   Oral   Take 40 mg by mouth daily.         Marland Kitchen HYDROCHLOROTHIAZIDE 25 MG PO TABS   Oral   Take 25 mg by mouth daily.         Marland Kitchen GERITOL PO   Oral   Take 1 tablet by mouth daily.           Marland Kitchen LITHIUM CARBONATE 300 MG PO CAPS   Oral   Take 300 mg by mouth daily.           Marland Kitchen ROSUVASTATIN CALCIUM 10 MG PO TABS   Oral   Take 10 mg by mouth daily.           . SUMATRIPTAN SUCCINATE 100 MG PO TABS   Oral   Take 100 mg by mouth daily as needed. Prn migraine         .  ZOLPIDEM TARTRATE 10 MG PO TABS   Oral   Take 10 mg by mouth at bedtime as needed. For sleep           BP 138/85  Pulse 61  Temp 98.2 F (36.8 C) (Oral)  Resp 25  SpO2 100%  Physical Exam  Nursing note and vitals reviewed. Constitutional: She is oriented to person, place, and time. She appears well-developed and well-nourished.       NAD   HENT:  Head: Normocephalic.  Mouth/Throat: Oropharynx is clear and moist.  Eyes: Conjunctivae normal are normal. Pupils are equal, round, and reactive to light.  Neck: Normal range of motion. Neck supple.  Cardiovascular: Normal rate, regular rhythm and normal heart sounds.   Pulmonary/Chest: Effort normal and breath sounds normal. No respiratory distress. She has no wheezes. She has no rales.  Abdominal: Soft. Bowel sounds are normal. She exhibits no distension. There is no tenderness. There is no rebound.  Musculoskeletal: Normal range of motion. She exhibits no edema and no tenderness.  Neurological: She is alert and oriented to person, place, and time.       No tremors. No pronator drift.   Skin: Skin is warm and dry.  Psychiatric: She has a normal mood and affect. Her behavior is normal. Judgment and thought content normal.    ED Course  Procedures (including critical care time)  Labs Reviewed  CBC  WITH DIFFERENTIAL - Abnormal; Notable for the following:    HCT 35.0 (*)     Eosinophils Relative 6 (*)     All other components within normal limits  BASIC METABOLIC PANEL - Abnormal; Notable for the following:    Sodium 133 (*)     Glucose, Bld 105 (*)     BUN 27 (*)     Creatinine, Ser 1.47 (*)     GFR calc non Af Amer 35 (*)     GFR calc Af Amer 41 (*)     All other components within normal limits  URINALYSIS, ROUTINE W REFLEX MICROSCOPIC - Abnormal; Notable for the following:    APPearance CLOUDY (*)     Protein, ur 30 (*)     All other components within normal limits  URINE MICROSCOPIC-ADD ON - Abnormal; Notable for the following:    Squamous Epithelial / LPF MANY (*)     Bacteria, UA FEW (*)     All other components within normal limits  GLUCOSE, CAPILLARY  TROPONIN I  LITHIUM LEVEL   Dg Chest 2 View  09/12/2012  *RADIOLOGY REPORT*  Clinical Data: Dizziness, weakness, smoking history  CHEST - 2 VIEW  Comparison: 07/14/2011  Findings: Hyperinflation indicates COPD.  Heart size and vascular pattern are normal.  Lungs are clear.  There is no change from the prior study.  IMPRESSION: No acute findings.   Original Report Authenticated By: Skipper Cliche, M.D.    Ct Head Wo Contrast  09/12/2012  *RADIOLOGY REPORT*  Clinical Data: 71 year old female dizziness and lightheaded. Syncope 2 days ago.  CT HEAD WITHOUT CONTRAST  Technique:  Contiguous axial images were obtained from the base of the skull through the vertex without contrast.  Comparison: 02/21/2009 and earlier.  Findings: Visualized paranasal sinuses and mastoids are clear.  No acute osseous abnormality identified.  Visualized orbit soft tissues are within normal limits.  No scalp hematoma identified.  Calcified atherosclerosis at the skull base.  Cerebral volume is within normal limits for age.  No midline shift, ventriculomegaly, mass effect, evidence of  mass lesion, intracranial hemorrhage or evidence of cortically based  acute infarction. Minimal to mild cerebral white matter hypodensity.  Small dilated perivascular space at the inferior left basal ganglia.  Remaining gray-white matter differentiation is within normal limits throughout the brain.  No suspicious intracranial vascular hyperdensity.  IMPRESSION: Stable and negative for age noncontrast CT appearance of the brain.   Original Report Authenticated By: Roselyn Reef, M.D.      No diagnosis found.   Date: 09/12/2012  Rate: 65  Rhythm: normal sinus rhythm  QRS Axis: normal  Intervals: normal  ST/T Wave abnormalities: normal  Conduction Disutrbances:none  Narrative Interpretation:   Old EKG Reviewed: unchanged    MDM  LEIALOHA CEASE is a 71 y.o. female here with syncope. Will consider infection, electrolyte imbalance, less likely to have stroke, low risk for ACS. Will get labs, CT head, EKG, UA. She is mildly orthostatic so will give IVF and reassess.   5:01 PM Labs nl. CT head showed no acute stroke. UA nl. She felt better after IVF. I think she is a little dehydrated. Recommend staying hydrated and outpatient f/u.         Wandra Arthurs, MD 09/12/12 [phone]

## 2012-09-12 NOTE — ED Notes (Signed)
Pt ambulatory leaving ED by self. Pt given d/c teaching and follow up care for neurologist. Pt shown to d/c window in waiting room. Pt does not have any further questions upon d/c.

## 2012-09-12 NOTE — ED Notes (Signed)
AIDET performed. 

## 2012-09-18 ENCOUNTER — Encounter (HOSPITAL_COMMUNITY): Payer: Self-pay | Admitting: *Deleted

## 2012-09-18 ENCOUNTER — Emergency Department (HOSPITAL_COMMUNITY)
Admission: EM | Admit: 2012-09-18 | Discharge: 2012-09-19 | Disposition: A | Discharge status: Home / Self Care | Payer: Medicare Other | Attending: Emergency Medicine | Admitting: Emergency Medicine

## 2012-09-18 DIAGNOSIS — I2510 Atherosclerotic heart disease of native coronary artery without angina pectoris: Secondary | ICD-10-CM | POA: Insufficient documentation

## 2012-09-18 DIAGNOSIS — F17200 Nicotine dependence, unspecified, uncomplicated: Secondary | ICD-10-CM | POA: Insufficient documentation

## 2012-09-18 DIAGNOSIS — Z8739 Personal history of other diseases of the musculoskeletal system and connective tissue: Secondary | ICD-10-CM | POA: Insufficient documentation

## 2012-09-18 DIAGNOSIS — E785 Hyperlipidemia, unspecified: Secondary | ICD-10-CM | POA: Insufficient documentation

## 2012-09-18 DIAGNOSIS — Z79899 Other long term (current) drug therapy: Secondary | ICD-10-CM | POA: Insufficient documentation

## 2012-09-18 DIAGNOSIS — I10 Essential (primary) hypertension: Secondary | ICD-10-CM | POA: Insufficient documentation

## 2012-09-18 DIAGNOSIS — R51 Headache: Secondary | ICD-10-CM | POA: Insufficient documentation

## 2012-09-18 DIAGNOSIS — F411 Generalized anxiety disorder: Secondary | ICD-10-CM | POA: Insufficient documentation

## 2012-09-18 MED ORDER — TRAMADOL HCL 50 MG PO TABS: 50.0000 mg | ORAL_TABLET | Freq: Four times a day (QID) | ORAL | Status: DC | PRN

## 2012-09-18 MED ORDER — KETOROLAC TROMETHAMINE 15 MG/ML IJ SOLN
15.0000 mg | Freq: Once | INTRAMUSCULAR | Status: AC
  Administered 2012-09-18: 15 mg via INTRAVENOUS
  Filled 2012-09-18: qty 1

## 2012-09-18 MED ORDER — METOCLOPRAMIDE HCL 5 MG/ML IJ SOLN
10.0000 mg | Freq: Once | INTRAMUSCULAR | Status: AC
  Administered 2012-09-18: 10 mg via INTRAVENOUS
  Filled 2012-09-18: qty 2

## 2012-09-18 MED ORDER — KETOROLAC TROMETHAMINE 30 MG/ML IJ SOLN
15.0000 mg | Freq: Once | INTRAMUSCULAR | Status: DC
  Filled 2012-09-18: qty 1

## 2012-09-18 MED ORDER — DIPHENHYDRAMINE HCL 50 MG/ML IJ SOLN
25.0000 mg | Freq: Once | INTRAMUSCULAR | Status: AC
  Administered 2012-09-18: 25 mg via INTRAVENOUS
  Filled 2012-09-18: qty 1

## 2012-09-18 MED ORDER — SODIUM CHLORIDE 0.9 % IV BOLUS (SEPSIS)
1000.0000 mL | Freq: Once | INTRAVENOUS | Status: AC
  Administered 2012-09-18: 1000 mL via INTRAVENOUS

## 2012-09-18 NOTE — ED Notes (Signed)
Pt in c/o migraine x4 hours, pt states she has history of same and this feels typical for her, states she gets them all the time, also c/o nausea.

## 2012-09-22 NOTE — ED Provider Notes (Signed)
History    71 year old female with headache. Patient has a history of what she calls migraine headaches and states that her current headache feel similar previous. Denies trauma. Headache is diffuse, slightly worse on the right side. Relatively constant without appreciable exacerbating relieving factors. No phono/photophobia. No fevers or chills. No neck pain or stiffness. No nausea or vomiting. No acute visual changes.   CSN: [id number]  Arrival date & time 09/18/12  1736   First MD Initiated Contact with Patient 09/18/12 2202      Chief Complaint  Patient presents with  . Migraine    (Consider location/radiation/quality/duration/timing/severity/associated sxs/prior treatment) HPI  Past Medical History  Diagnosis Date  . Hyperlipidemia   . Hypertension   . Leg pain   . Joint pain   . Headache   . Anxiety   . Arthritis     Past Surgical History  Procedure Date  . Total hip arthroplasty     right  . 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 Heart attack Mother   . Heart attack Father   . Cancer Sister   . Aneurysm Brother   . Cancer Brother     History  Substance Use Topics  . Smoking status: Current Every Day Smoker -- 0.5 packs/day    Types: Cigarettes  . Smokeless tobacco: Not on file  . Alcohol Use: No    OB History    Grav Para Term Preterm Abortions TAB SAB Ect Mult Living                  Review of Systems   Review of symptoms negative unless otherwise noted in HPI.   Allergies  Review of patient's allergies indicates no known allergies.  Home Medications   Current Outpatient Rx  Name  Route  Sig  Dispense  Refill  . ACETAMINOPHEN 500 MG PO TABS   Oral   Take 1,000 mg by mouth every 6 (six) hours as needed. For pain         . AMLODIPINE BESYLATE 10 MG PO TABS   Oral   Take 10 mg by mouth daily.         . BUSPIRONE HCL 15 MG PO TABS   Oral   Take 15 mg by mouth 2 (two) times  daily as needed. For anxiety.         Marland Kitchen CITALOPRAM HYDROBROMIDE 40 MG PO TABS   Oral   Take 40 mg by mouth daily.         Marland Kitchen HYDROCHLOROTHIAZIDE 25 MG PO TABS   Oral   Take 25 mg by mouth daily.         Marland Kitchen GERITOL PO   Oral   Take 1 tablet by mouth daily.           Marland Kitchen LITHIUM CARBONATE 300 MG PO CAPS   Oral   Take 300 mg by mouth daily.           Marland Kitchen ROSUVASTATIN CALCIUM 10 MG PO TABS   Oral   Take 10 mg by mouth daily.           . SUMATRIPTAN SUCCINATE 100 MG PO TABS   Oral   Take 100 mg by mouth daily as needed. Prn migraine         . ZOLPIDEM TARTRATE 10 MG PO TABS   Oral   Take 10 mg by mouth at bedtime as needed. For sleep         .  TRAMADOL HCL 50 MG PO TABS   Oral   Take 1 tablet (50 mg total) by mouth every 6 (six) hours as needed for pain.   15 tablet   0     BP 195/85  Pulse 54  Temp 98 F (36.7 C) (Oral)  Resp 18  SpO2 100%  Physical Exam  Nursing note and vitals reviewed. Constitutional: She is oriented to person, place, and time. She appears well-developed and well-nourished. No distress.  HENT:  Head: Normocephalic and atraumatic.       No external signs of head trauma  Eyes: Conjunctivae normal are normal. Right eye exhibits no discharge. Left eye exhibits no discharge.  Neck: Normal range of motion. Neck supple.       No nuchal rigidity  Cardiovascular: Normal rate, regular rhythm and normal heart sounds.  Exam reveals no gallop and no friction rub.   No murmur heard. Pulmonary/Chest: Effort normal and breath sounds normal. No respiratory distress.  Abdominal: Soft. She exhibits no distension. There is no tenderness.  Musculoskeletal: She exhibits no edema and no tenderness.  Neurological: She is alert and oriented to person, place, and time. No cranial nerve deficit. She exhibits normal muscle tone. Coordination normal.  Skin: Skin is warm and dry. She is not diaphoretic.  Psychiatric: She has a normal mood and affect. Her  behavior is normal. Thought content normal.    ED Course  Procedures (including critical care time)  Labs Reviewed - No data to display No results found.   1. Headache       MDM  71yf with HA. Suspect primary HA. Consider emergent secondary causes such as bleed, infectious or mass but doubt. There is no history of trauma. Pt has a nonfocal neurological exam. Afebrile and neck supple. No use of blood thinning medication. Consider ocular etiology such as acute angle closure glaucoma but doubt. Pt denies acute change in visual acuity and eye exam unremarkable. Doubt temporal arteritis w/, no temporal tenderness and temporal artery pulsations palpable. Doubt CO poisoning. No contacts with similar symptoms. Doubt venous thrombosis. Doubt carotid or vertebral arteries dissection. Symptoms improved with meds. Feel that can be safely discharged, but strict return precautions discussed. Outpt fu.         Virgel Manifold, MD 09/22/12 1630

## 2012-09-23 ENCOUNTER — Emergency Department (HOSPITAL_COMMUNITY): Payer: Medicare Other

## 2012-09-23 ENCOUNTER — Emergency Department (HOSPITAL_COMMUNITY)
Admission: EM | Admit: 2012-09-23 | Discharge: 2012-09-23 | Disposition: A | Discharge status: Home / Self Care | Payer: Medicare Other | Attending: Emergency Medicine | Admitting: Emergency Medicine

## 2012-09-23 ENCOUNTER — Encounter (HOSPITAL_COMMUNITY): Payer: Self-pay | Admitting: Emergency Medicine

## 2012-09-23 DIAGNOSIS — R112 Nausea with vomiting, unspecified: Secondary | ICD-10-CM | POA: Insufficient documentation

## 2012-09-23 DIAGNOSIS — F411 Generalized anxiety disorder: Secondary | ICD-10-CM | POA: Insufficient documentation

## 2012-09-23 DIAGNOSIS — I10 Essential (primary) hypertension: Secondary | ICD-10-CM | POA: Insufficient documentation

## 2012-09-23 DIAGNOSIS — Z8739 Personal history of other diseases of the musculoskeletal system and connective tissue: Secondary | ICD-10-CM | POA: Insufficient documentation

## 2012-09-23 DIAGNOSIS — F17200 Nicotine dependence, unspecified, uncomplicated: Secondary | ICD-10-CM | POA: Insufficient documentation

## 2012-09-23 DIAGNOSIS — Z8669 Personal history of other diseases of the nervous system and sense organs: Secondary | ICD-10-CM | POA: Insufficient documentation

## 2012-09-23 DIAGNOSIS — Z79899 Other long term (current) drug therapy: Secondary | ICD-10-CM | POA: Insufficient documentation

## 2012-09-23 DIAGNOSIS — E785 Hyperlipidemia, unspecified: Secondary | ICD-10-CM | POA: Insufficient documentation

## 2012-09-23 LAB — BASIC METABOLIC PANEL
BUN: 12 mg/dL (ref 6–23)
CO2: 22 mEq/L (ref 19–32)
Calcium: 10.3 mg/dL (ref 8.4–10.5)
Chloride: 101 mEq/L (ref 96–112)
Creatinine, Ser: 1.38 mg/dL — ABNORMAL HIGH (ref 0.50–1.10)
GFR calc Af Amer: 44 mL/min — ABNORMAL LOW (ref >90)
GFR calc non Af Amer: 38 mL/min — ABNORMAL LOW (ref >90)
Glucose, Bld: 104 mg/dL — ABNORMAL HIGH (ref 70–99)
Potassium: 4.3 mEq/L (ref 3.5–5.1)
Sodium: 135 mEq/L (ref 135–145)

## 2012-09-23 LAB — CBC WITH DIFFERENTIAL/PLATELET
Basophils Absolute: 0.1 10*3/uL (ref 0.0–0.1)
Basophils Relative: 1 % (ref 0–1)
Eosinophils Absolute: 0.5 10*3/uL (ref 0.0–0.7)
Eosinophils Relative: 6 % — ABNORMAL HIGH (ref 0–5)
HCT: 33.9 % — ABNORMAL LOW (ref 36.0–46.0)
Hemoglobin: 11.8 g/dL — ABNORMAL LOW (ref 12.0–15.0)
Lymphocytes Relative: 23 % (ref 12–46)
Lymphs Abs: 1.8 10*3/uL (ref 0.7–4.0)
MCH: 29.2 pg (ref 26.0–34.0)
MCHC: 34.8 g/dL (ref 30.0–36.0)
MCV: 83.9 fL (ref 78.0–100.0)
Monocytes Absolute: 0.6 10*3/uL (ref 0.1–1.0)
Monocytes Relative: 7 % (ref 3–12)
Neutro Abs: 5 10*3/uL (ref 1.7–7.7)
Neutrophils Relative %: 62 % (ref 43–77)
Platelets: 403 10*3/uL — ABNORMAL HIGH (ref 150–400)
RBC: 4.04 MIL/uL (ref 3.87–5.11)
RDW: 15.7 % — ABNORMAL HIGH (ref 11.5–15.5)
WBC: 8 10*3/uL (ref 4.0–10.5)

## 2012-09-23 LAB — URINALYSIS, ROUTINE W REFLEX MICROSCOPIC
Bilirubin Urine: NEGATIVE
Glucose, UA: NEGATIVE mg/dL
Hgb urine dipstick: NEGATIVE
Ketones, ur: NEGATIVE mg/dL
Leukocytes, UA: NEGATIVE
Nitrite: NEGATIVE
Protein, ur: 100 mg/dL — AB
Specific Gravity, Urine: 1.015 (ref 1.005–1.030)
Urobilinogen, UA: 1 mg/dL (ref 0.0–1.0)
pH: 6 (ref 5.0–8.0)

## 2012-09-23 LAB — TROPONIN I
Troponin I: <0.3 ng/mL (ref <0.30)
Troponin I: <0.3 ng/mL (ref <0.30)

## 2012-09-23 LAB — HEPATIC FUNCTION PANEL
ALT: 11 U/L (ref 0–35)
AST: 18 U/L (ref 0–37)
Albumin: 3.7 g/dL (ref 3.5–5.2)
Alkaline Phosphatase: 134 U/L — ABNORMAL HIGH (ref 39–117)
Bilirubin, Direct: <0.1 mg/dL (ref 0.0–0.3)
Total Bilirubin: 0.2 mg/dL — ABNORMAL LOW (ref 0.3–1.2)
Total Protein: 7.3 g/dL (ref 6.0–8.3)

## 2012-09-23 LAB — URINE MICROSCOPIC-ADD ON

## 2012-09-23 LAB — LITHIUM LEVEL: Lithium Lvl: 0.85 mEq/L (ref 0.80–1.40)

## 2012-09-23 LAB — MAGNESIUM: Magnesium: 1.8 mg/dL (ref 1.5–2.5)

## 2012-09-23 MED ORDER — ASPIRIN 81 MG PO CHEW
162.0000 mg | CHEWABLE_TABLET | Freq: Once | ORAL | Status: AC
  Administered 2012-09-23: 162 mg via ORAL
  Filled 2012-09-23: qty 2

## 2012-09-23 MED ORDER — METOCLOPRAMIDE HCL 10 MG PO TABS: 5.0000 mg | ORAL_TABLET | Freq: Four times a day (QID) | ORAL | Status: DC

## 2012-09-23 MED ORDER — SODIUM CHLORIDE 0.9 % IV BOLUS (SEPSIS)
1000.0000 mL | Freq: Once | INTRAVENOUS | Status: AC
  Administered 2012-09-23: 1000 mL via INTRAVENOUS

## 2012-09-23 MED ORDER — ONDANSETRON 8 MG PO TBDP: 8.0000 mg | ORAL_TABLET | Freq: Three times a day (TID) | ORAL | Status: DC | PRN

## 2012-09-23 NOTE — ED Notes (Signed)
Reports normally drinks 3 cup of coffee everyday did not have none today.

## 2012-09-23 NOTE — ED Provider Notes (Addendum)
History     CSN: [id number]  Arrival date & time 09/23/12  1657   First MD Initiated Contact with Patient 09/23/12 1717      Chief Complaint  Patient presents with  . Emesis    (Consider location/radiation/quality/duration/timing/severity/associated sxs/prior treatment) HPI Comments: Pt comes in with cc of emesis. Pt has hx of migraines, no other significant medical hx and no cardiac hx, abd surgical hx. Pt reports that for the past 2-3 days she has been having nausea and emesis after every time she would consume something. She only reports to having 2 emesis however, usually 15-20 minutes after po intake. Pt has no abd pain, she has no diarrhea, and her last BM was 1 day ago. She denies any dysphagia, no previous hx of similar complains. Pt has no chest pain, sob. She does feel a little dizzy.   Patient is a 71 y.o. female presenting with vomiting. The history is provided by the patient.  Emesis  Pertinent negatives include no abdominal pain, no cough, no diarrhea, no fever and no headaches.    Past Medical History  Diagnosis Date  . Hyperlipidemia   . Hypertension   . Leg pain   . Joint pain   . Headache   . Anxiety   . Arthritis     Past Surgical History  Procedure Date  . Total hip arthroplasty     right  . 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 Heart attack Mother   . Heart attack Father   . Cancer Sister   . Aneurysm Brother   . Cancer Brother     History  Substance Use Topics  . Smoking status: Current Every Day Smoker -- 0.5 packs/day    Types: Cigarettes  . Smokeless tobacco: Not on file  . Alcohol Use: No    OB History    Grav Para Term Preterm Abortions TAB SAB Ect Mult Living                  Review of Systems  Constitutional: Negative for fever and activity change.  HENT: Negative for facial swelling and neck pain.   Respiratory: Negative for cough, shortness of breath and wheezing.    Cardiovascular: Negative for chest pain.  Gastrointestinal: Positive for nausea and vomiting. Negative for abdominal pain, diarrhea, constipation, blood in stool and abdominal distention.  Genitourinary: Negative for dysuria, hematuria and difficulty urinating.  Skin: Negative for color change.  Neurological: Negative for speech difficulty and headaches.  Hematological: Does not bruise/bleed easily.  Psychiatric/Behavioral: Negative for confusion.    Allergies  Review of patient's allergies indicates no known allergies.  Home Medications   Current Outpatient Rx  Name  Route  Sig  Dispense  Refill  . AMLODIPINE BESYLATE 10 MG PO TABS   Oral   Take 10 mg by mouth daily.         . BUSPIRONE HCL 15 MG PO TABS   Oral   Take 15 mg by mouth 2 (two) times daily as needed. For anxiety.         Marland Kitchen CITALOPRAM HYDROBROMIDE 40 MG PO TABS   Oral   Take 40 mg by mouth daily.         Marland Kitchen HYDROCHLOROTHIAZIDE 25 MG PO TABS   Oral   Take 25 mg by mouth daily.         Marland Kitchen GERITOL PO   Oral   Take 1  tablet by mouth daily.          Marland Kitchen LITHIUM CARBONATE 300 MG PO CAPS   Oral   Take 300 mg by mouth daily.           Marland Kitchen ROSUVASTATIN CALCIUM 10 MG PO TABS   Oral   Take 10 mg by mouth daily.           . SUMATRIPTAN SUCCINATE 100 MG PO TABS   Oral   Take 100 mg by mouth daily as needed. Prn migraine         . TRAMADOL HCL 50 MG PO TABS   Oral   Take 50 mg by mouth every 6 (six) hours as needed. Pain         . ZOLPIDEM TARTRATE 10 MG PO TABS   Oral   Take 10 mg by mouth at bedtime as needed. For sleep           BP 164/83  Pulse 78  Temp 98.9 F (37.2 C)  Resp 16  SpO2 96%  Physical Exam  Nursing note and vitals reviewed. Constitutional: She is oriented to person, place, and time. She appears well-developed and well-nourished.  HENT:  Head: Normocephalic and atraumatic.  Eyes: EOM are normal. Pupils are equal, round, and reactive to light.  Neck: Neck supple.  No JVD present.  Cardiovascular: Normal rate, regular rhythm and normal heart sounds.   No murmur heard. Pulmonary/Chest: Effort normal. No respiratory distress.  Abdominal: Soft. She exhibits no distension. There is no tenderness. There is no rebound and no guarding.  Musculoskeletal: She exhibits no edema.  Neurological: She is alert and oriented to person, place, and time.  Skin: Skin is warm and dry.    ED Course  Procedures (including critical care time)   Labs Reviewed  URINALYSIS, ROUTINE W REFLEX MICROSCOPIC  MAGNESIUM  CBC WITH DIFFERENTIAL  BASIC METABOLIC PANEL  TROPONIN I  HEPATIC FUNCTION PANEL   No results found.   No diagnosis found.    MDM   Date: 09/23/2012  Rate:67  Rhythm: normal sinus rhythm  QRS Axis: normal  Intervals: normal  ST/T Wave abnormalities: normal  Conduction Disutrbances:none  Narrative Interpretation:   Old EKG Reviewed: none available  Pt comes in with cc of emesis with po intake. She however has no other GI sx, denies any cardiac hx and has no chest pain, sob either. The emesis comprises of food only. Pt has some dizziness, we will hydrate here and check basic labs and cardiac enzymes with po challenge. AAS ordered to ensure there is no obstruction, abd exam is benign.    Varney Biles, MD 09/23/12 1900  10:20 PM Serial trops are negative. Labs are WNL. I noticed she was on lithium, and she takes it for migraines - prn. The levels are normal, and she is not even taking it at this point, so i dont think this is chronic lithium tox.  Advised to take reglan, and see her PCP or GI doctor - possible gastroparesis?, tumors?  Varney Biles, MD 09/23/12 2222

## 2012-09-23 NOTE — ED Notes (Signed)
MD at bedside. 

## 2012-09-23 NOTE — ED Notes (Signed)
Pt c/o dizziness upon standing. Pt also c/o shaking and chills. Pt states symptoms started last week when she got a migraine headache. Pt c/o n/v for last 2 days. Denies diarrhea. Pt denies pain. Pt a/o x 3. Pt BIB family.

## 2012-09-23 NOTE — ED Notes (Addendum)
Report nausea & vomiting for 4-5 days. This started on Wed. After experiencing a miagrain  for 2-3weeks. Seen @ [facility] ED twice this week due to the headaches, nausea & vomiting.  Patient stated she feels so dizzy it's causing her to fall.

## 2013-04-11 ENCOUNTER — Other Ambulatory Visit: Payer: Self-pay | Admitting: Ophthalmology

## 2013-05-03 ENCOUNTER — Ambulatory Visit: Payer: Medicare Other | Admitting: Neurosurgery

## 2013-05-28 ENCOUNTER — Encounter (INDEPENDENT_AMBULATORY_CARE_PROVIDER_SITE_OTHER): Payer: Medicare Other | Admitting: *Deleted

## 2013-05-28 DIAGNOSIS — I739 Peripheral vascular disease, unspecified: Secondary | ICD-10-CM

## 2013-05-28 DIAGNOSIS — Z48812 Encounter for surgical aftercare following surgery on the circulatory system: Secondary | ICD-10-CM

## 2013-06-05 ENCOUNTER — Other Ambulatory Visit: Payer: Self-pay | Admitting: *Deleted

## 2013-06-05 DIAGNOSIS — Z48812 Encounter for surgical aftercare following surgery on the circulatory system: Secondary | ICD-10-CM

## 2013-06-05 DIAGNOSIS — I739 Peripheral vascular disease, unspecified: Secondary | ICD-10-CM

## 2013-06-08 ENCOUNTER — Other Ambulatory Visit: Payer: Self-pay | Admitting: *Deleted

## 2013-06-08 DIAGNOSIS — Z48812 Encounter for surgical aftercare following surgery on the circulatory system: Secondary | ICD-10-CM

## 2013-06-08 DIAGNOSIS — I70219 Atherosclerosis of native arteries of extremities with intermittent claudication, unspecified extremity: Secondary | ICD-10-CM

## 2013-06-11 ENCOUNTER — Encounter: Payer: Self-pay | Admitting: Surgery

## 2013-06-20 ENCOUNTER — Emergency Department (HOSPITAL_COMMUNITY)
Admission: EM | Admit: 2013-06-20 | Discharge: 2013-06-21 | Disposition: A | Discharge status: Home / Self Care | Payer: Medicare Other | Attending: Emergency Medicine | Admitting: Emergency Medicine

## 2013-06-20 ENCOUNTER — Encounter (HOSPITAL_COMMUNITY): Payer: Self-pay | Admitting: Emergency Medicine

## 2013-06-20 ENCOUNTER — Emergency Department (HOSPITAL_COMMUNITY): Payer: Medicare Other

## 2013-06-20 DIAGNOSIS — Z79899 Other long term (current) drug therapy: Secondary | ICD-10-CM | POA: Insufficient documentation

## 2013-06-20 DIAGNOSIS — L509 Urticaria, unspecified: Secondary | ICD-10-CM | POA: Insufficient documentation

## 2013-06-20 DIAGNOSIS — F411 Generalized anxiety disorder: Secondary | ICD-10-CM | POA: Insufficient documentation

## 2013-06-20 DIAGNOSIS — I10 Essential (primary) hypertension: Secondary | ICD-10-CM | POA: Insufficient documentation

## 2013-06-20 DIAGNOSIS — R5381 Other malaise: Secondary | ICD-10-CM | POA: Insufficient documentation

## 2013-06-20 DIAGNOSIS — Z8739 Personal history of other diseases of the musculoskeletal system and connective tissue: Secondary | ICD-10-CM | POA: Insufficient documentation

## 2013-06-20 DIAGNOSIS — E785 Hyperlipidemia, unspecified: Secondary | ICD-10-CM | POA: Insufficient documentation

## 2013-06-20 DIAGNOSIS — M129 Arthropathy, unspecified: Secondary | ICD-10-CM | POA: Insufficient documentation

## 2013-06-20 DIAGNOSIS — F17200 Nicotine dependence, unspecified, uncomplicated: Secondary | ICD-10-CM | POA: Insufficient documentation

## 2013-06-20 DIAGNOSIS — R630 Anorexia: Secondary | ICD-10-CM | POA: Insufficient documentation

## 2013-06-20 DIAGNOSIS — R112 Nausea with vomiting, unspecified: Secondary | ICD-10-CM | POA: Insufficient documentation

## 2013-06-20 LAB — COMPREHENSIVE METABOLIC PANEL
ALT: 9 U/L (ref 0–35)
AST: 15 U/L (ref 0–37)
Albumin: 3.9 g/dL (ref 3.5–5.2)
Alkaline Phosphatase: 109 U/L (ref 39–117)
BUN: 32 mg/dL — ABNORMAL HIGH (ref 6–23)
CO2: 19 mEq/L (ref 19–32)
Calcium: 10.6 mg/dL — ABNORMAL HIGH (ref 8.4–10.5)
Chloride: 110 mEq/L (ref 96–112)
Creatinine, Ser: 1.7 mg/dL — ABNORMAL HIGH (ref 0.50–1.10)
GFR calc Af Amer: 34 mL/min — ABNORMAL LOW (ref >90)
GFR calc non Af Amer: 30 mL/min — ABNORMAL LOW (ref >90)
Glucose, Bld: 106 mg/dL — ABNORMAL HIGH (ref 70–99)
Potassium: 4.3 mEq/L (ref 3.5–5.1)
Sodium: 142 mEq/L (ref 135–145)
Total Bilirubin: 0.1 mg/dL — ABNORMAL LOW (ref 0.3–1.2)
Total Protein: 7.6 g/dL (ref 6.0–8.3)

## 2013-06-20 LAB — CBC
HCT: 31.6 % — ABNORMAL LOW (ref 36.0–46.0)
Hemoglobin: 11.3 g/dL — ABNORMAL LOW (ref 12.0–15.0)
MCH: 29.3 pg (ref 26.0–34.0)
MCHC: 35.8 g/dL (ref 30.0–36.0)
MCV: 81.9 fL (ref 78.0–100.0)
Platelets: 325 10*3/uL (ref 150–400)
RBC: 3.86 MIL/uL — ABNORMAL LOW (ref 3.87–5.11)
RDW: 14.7 % (ref 11.5–15.5)
WBC: 8.4 10*3/uL (ref 4.0–10.5)

## 2013-06-20 LAB — POCT I-STAT TROPONIN I: Troponin i, poc: 0.03 ng/mL (ref 0.00–0.08)

## 2013-06-20 MED ORDER — SODIUM CHLORIDE 0.9 % IV BOLUS (SEPSIS)
1000.0000 mL | Freq: Once | INTRAVENOUS | Status: AC
  Administered 2013-06-20: 1000 mL via INTRAVENOUS

## 2013-06-20 NOTE — ED Notes (Signed)
Pt. reports intermittent generalized itchy rashes for several days mostly at night , respirations unlabored , airway intact .

## 2013-06-20 NOTE — ED Notes (Signed)
Patient transported to X-ray 

## 2013-06-20 NOTE — ED Provider Notes (Signed)
  MSE was initiated and I personally evaluated the patient and placed orders (if any) at  20:06 on June 20, 2013.  The patient appears stable so that the remainder of the MSE may be completed by another provider.    S: Alyssa [name] is a 72 y.o. female who presents to the Emergency Department complaining of constant, unchanged weakness with associated nausea and emesis for the last 4 days. She reports having decreased PO intake in the last several days. She also complains of intermittent generalized rashes that began 3-4 days ago and are worse at night. She has applied alcohol with minimal relief. She denies fever, chills, abdominal pain, bloody diarrhea, bloody emesis, cough, nasal congestion, increased SOB.    Review of Systems  Constitutional: Negative for fever and chills.  HENT: Negative for congestion.   Respiratory: Negative for cough and shortness of breath.   Cardiovascular: Negative for chest pain.  Gastrointestinal: Positive for nausea and vomiting. Negative for abdominal pain.  Neurological: Positive for weakness.  All other systems reviewed and are negative.    Physical Exam  Nursing note and vitals reviewed. Constitutional: She appears well-developed and well-nourished. No distress.  Awake, alert, nontoxic appearance  HENT:  Head: Normocephalic and atraumatic.  Mouth/Throat: Oropharynx is clear and moist. No oropharyngeal exudate.  Eyes: Conjunctivae are normal. No scleral icterus.  Neck: Normal range of motion. Neck supple.  Cardiovascular: Normal rate, regular rhythm, normal heart sounds and intact distal pulses.   Pulmonary/Chest: Effort normal and breath sounds normal. No respiratory distress. She has no wheezes. She has no rales. She exhibits no tenderness.  Clear and equal breath sounds.   Abdominal: Soft. Bowel sounds are normal. She exhibits no mass. There is no tenderness. There is no rebound and no guarding.  Musculoskeletal: Normal range of motion.  She exhibits no edema.  Right leg is shorter than left leg.   Neurological: She is alert.  Speech is clear and goal oriented Moves extremities without ataxia  Skin: Skin is warm and dry. She is not diaphoretic.  Excoriations all over body.   Psychiatric: She has a normal mood and affect.    ED Course  Procedures   MDM    Plan: Labs, ECG and CXR ordered along with IV fluids.  Pt to be moved to the back and changed to a level 3 for further evaluation of her generalized weakness with associated N/V.     Jarrett Soho Tishina Lown, PA-C 06/21/13 [phone]

## 2013-06-20 NOTE — ED Notes (Signed)
Pt returns from xray

## 2013-06-21 LAB — LIPASE, BLOOD: Lipase: 27 U/L (ref 11–59)

## 2013-06-21 LAB — URINALYSIS, ROUTINE W REFLEX MICROSCOPIC
Bilirubin Urine: NEGATIVE
Glucose, UA: NEGATIVE mg/dL
Hgb urine dipstick: NEGATIVE
Ketones, ur: NEGATIVE mg/dL
Leukocytes, UA: NEGATIVE
Nitrite: NEGATIVE
Protein, ur: NEGATIVE mg/dL
Specific Gravity, Urine: 1.01 (ref 1.005–1.030)
Urobilinogen, UA: 0.2 mg/dL (ref 0.0–1.0)
pH: 6 (ref 5.0–8.0)

## 2013-06-21 MED ORDER — METHYLPREDNISOLONE SODIUM SUCC 125 MG IJ SOLR
125.0000 mg | Freq: Once | INTRAMUSCULAR | Status: AC
  Administered 2013-06-21: 125 mg via INTRAVENOUS
  Filled 2013-06-21: qty 2

## 2013-06-21 MED ORDER — SODIUM CHLORIDE 0.9 % IV SOLN
Freq: Once | INTRAVENOUS | Status: AC
  Administered 2013-06-21: 02:00:00 via INTRAVENOUS

## 2013-06-21 MED ORDER — FAMOTIDINE IN NACL 20-0.9 MG/50ML-% IV SOLN
20.0000 mg | Freq: Once | INTRAVENOUS | Status: AC
  Administered 2013-06-21: 20 mg via INTRAVENOUS
  Filled 2013-06-21: qty 50

## 2013-06-21 MED ORDER — FAMOTIDINE 20 MG PO TABS: 20.0000 mg | ORAL_TABLET | Freq: Two times a day (BID) | ORAL | Status: DC

## 2013-06-21 MED ORDER — DIPHENHYDRAMINE HCL 12.5 MG/5ML PO ELIX
12.5000 mg | ORAL_SOLUTION | Freq: Once | ORAL | Status: AC
  Administered 2013-06-21: 12.5 mg via ORAL

## 2013-06-21 MED ORDER — OXYCODONE-ACETAMINOPHEN 5-325 MG PO TABS
1.0000 | ORAL_TABLET | Freq: Once | ORAL | Status: AC
  Administered 2013-06-21: 1 via ORAL
  Filled 2013-06-21: qty 1

## 2013-06-21 MED ORDER — PREDNISONE 20 MG PO TABS: 60.0000 mg | ORAL_TABLET | Freq: Every day | ORAL | Status: DC

## 2013-06-21 MED ORDER — DIPHENHYDRAMINE HCL 25 MG PO TABS: 25.0000 mg | ORAL_TABLET | Freq: Four times a day (QID) | ORAL | Status: DC

## 2013-06-21 MED ORDER — DIPHENHYDRAMINE HCL 50 MG/ML IJ SOLN
12.5000 mg | Freq: Once | INTRAMUSCULAR | Status: DC
Start: 2013-06-21 — End: 2013-06-21
  Filled 2013-06-21: qty 1

## 2013-06-21 NOTE — ED Notes (Signed)
RN called cab for pt. Pt received narcotic before discharge.

## 2013-06-21 NOTE — ED Provider Notes (Signed)
CSN: [id number]     Arrival date & time 06/20/13  2006 History   First MD Initiated Contact with Patient 06/20/13 2207     Chief Complaint  Patient presents with  . Rash   (Consider location/radiation/quality/duration/timing/severity/associated sxs/prior Treatment) HPI 72 yo female presents to the ER from home with complaint of itchy rash for the last 4 days along with anorexia, generalized weakness, and n/v. Pt reports she doesn't have an appetite, and when she forces herself to eat, she vomiting.  Rash appears at night, has large welts, and is very itchy.  No new medications, soaps, detergents, food, travel.  She has been using alcohol without improvement.  No prior h/o same.  No diarrhea.  She reports she feels generally weak due to not eating well.  No abd pain, chest pain.    Past Medical History  Diagnosis Date  . Hyperlipidemia   . Hypertension   . Leg pain   . Joint pain   . Headache(784.0)   . Anxiety   . Arthritis    Past Surgical History  Procedure Laterality Date  . Total hip arthroplasty      right  . 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 Heart attack Mother   . Heart attack Father   . Cancer Sister   . Aneurysm Brother   . Cancer Brother    History  Substance Use Topics  . Smoking status: Current Every Day Smoker -- 0.50 packs/day    Types: Cigarettes  . Smokeless tobacco: Not on file  . Alcohol Use: No   OB History   Grav Para Term Preterm Abortions TAB SAB Ect Mult Living                 Review of Systems  All other systems reviewed and are negative.    Allergies  Review of patient's allergies indicates no known allergies.  Home Medications   Current Outpatient Rx  Name  Route  Sig  Dispense  Refill  . amLODipine (NORVASC) 10 MG tablet   Oral   Take 10 mg by mouth daily.         Marland Kitchen amLODipine-valsartan (EXFORGE) 10-320 MG per tablet   Oral   Take 1 tablet by mouth daily.          . busPIRone (BUSPAR) 15 MG tablet   Oral   Take 15 mg by mouth 2 (two) times daily as needed. For anxiety.         . Multiple Vitamins-Calcium (ONE-A-DAY WOMENS PO)   Oral   Take 1 tablet by mouth daily.         Marland Kitchen oxycodone (OXY-IR) 5 MG capsule   Oral   Take 5 mg by mouth every 4 (four) hours as needed for pain.         . rosuvastatin (CRESTOR) 10 MG tablet   Oral   Take 10 mg by mouth daily.           . SUMAtriptan (IMITREX) 100 MG tablet   Oral   Take 100 mg by mouth daily as needed. Prn migraine         . zolpidem (AMBIEN) 10 MG tablet   Oral   Take 10 mg by mouth at bedtime as needed. For sleep         . diphenhydrAMINE (BENADRYL) 25 MG tablet   Oral   Take 1 tablet (25 mg total) by mouth every  6 (six) hours.   20 tablet   0   . famotidine (PEPCID) 20 MG tablet   Oral   Take 1 tablet (20 mg total) by mouth 2 (two) times daily.   30 tablet   0   . predniSONE (DELTASONE) 20 MG tablet   Oral   Take 3 tablets (60 mg total) by mouth daily.   15 tablet   0    BP 135/81  Pulse 72  Temp(Src) 98 F (36.7 C) (Oral)  Resp 19  Ht 5\' 2"  (1.575 m)  Wt 110 lb (49.896 kg)  BMI 20.11 kg/m2  SpO2 99% Physical Exam  Nursing note and vitals reviewed. Constitutional: She is oriented to person, place, and time. She appears well-developed and well-nourished.  HENT:  Head: Normocephalic and atraumatic.  Right Ear: External ear normal.  Left Ear: External ear normal.  Nose: Nose normal.  Mouth/Throat: Oropharynx is clear and moist.  Eyes: Conjunctivae and EOM are normal. Pupils are equal, round, and reactive to light.  Neck: Normal range of motion. Neck supple. No JVD present. No tracheal deviation present. No thyromegaly present.  Cardiovascular: Normal rate, regular rhythm, normal heart sounds and intact distal pulses.  Exam reveals no gallop and no friction rub.   No murmur heard. Pulmonary/Chest: Effort normal and breath sounds normal. No stridor. No  respiratory distress. She has no wheezes. She has no rales. She exhibits no tenderness.  Abdominal: Soft. Bowel sounds are normal. She exhibits no distension and no mass. There is no tenderness. There is no rebound and no guarding.  Musculoskeletal: Normal range of motion. She exhibits no edema and no tenderness.  Right leg shorter than left  Lymphadenopathy:    She has no cervical adenopathy.  Neurological: She is alert and oriented to person, place, and time. She has normal reflexes. No cranial nerve deficit. She exhibits normal muscle tone. Coordination normal.  Skin: Skin is warm and dry. No rash noted. No erythema. No pallor.  Excoriations noted to left upper back  Psychiatric: She has a normal mood and affect. Her behavior is normal. Judgment and thought content normal.    ED Course  Procedures (including critical care time) Labs Review Labs Reviewed  CBC - Abnormal; Notable for the following:    RBC 3.86 (*)    Hemoglobin 11.3 (*)    HCT 31.6 (*)    All other components within normal limits  COMPREHENSIVE METABOLIC PANEL - Abnormal; Notable for the following:    Glucose, Bld 106 (*)    BUN 32 (*)    Creatinine, Ser 1.70 (*)    Calcium 10.6 (*)    Total Bilirubin 0.1 (*)    GFR calc non Af Amer 30 (*)    GFR calc Af Amer 34 (*)    All other components within normal limits  URINALYSIS, ROUTINE W REFLEX MICROSCOPIC  LIPASE, BLOOD  POCT I-STAT TROPONIN I   Imaging Review Dg Chest 2 View  06/20/2013   *RADIOLOGY REPORT*  Clinical Data: Shortness of breath  CHEST - 2 VIEW  Comparison: Acute abdominal series 09/23/2012  Findings: Heart size is within normal limits.  The thoracic aorta is tortuous.  Pulmonary vascularity is normal.  Lungs are mildly hyperinflated.  There are degenerative changes of the thoracic spine.  IMPRESSION: Mild hyperinflation of the lungs may be due to emphysema/COPD.  The lungs are clear.   Original Report Authenticated By: Curlene Dolphin, M.D.    MDM    1. Urticaria  2. Nausea & vomiting    72 yo female with n/v, urticaria for the last 4 days.  No significant findings on labwork.  Pt feeling better after benadryl, pepcid, solumedrol.  Will refer to her pcm and allergist.    Kalman Drape, MD 06/21/13 [phone]

## 2013-06-21 NOTE — ED Notes (Signed)
Cab paged for pt. Pt informed she cannot drive home d/t narcotic administration.

## 2013-06-21 NOTE — Discharge Instructions (Signed)
Please follow up with your doctor in 3-5 days for recheck.  Follow up with the allergy clinic if your hives are not improving.  Take medications as prescribed.  Return to the ER for worsening condition or new concerning symptoms.   Hives Hives are itchy, red, swollen areas of the skin. They can vary in size and location on your body. Hives can come and go for hours or several days (acute hives) or for several weeks (chronic hives). Hives do not spread from person to person (noncontagious). They may get worse with scratching, exercise, and emotional stress. CAUSES   Allergic reaction to food, additives, or drugs.  Infections, including the common cold.  Illness, such as vasculitis, lupus, or thyroid disease.  Exposure to sunlight, heat, or cold.  Exercise.  Stress.  Contact with chemicals. SYMPTOMS   Red or white swollen patches on the skin. The patches may change size, shape, and location quickly and repeatedly.  Itching.  Swelling of the hands, feet, and face. This may occur if hives develop deeper in the skin. DIAGNOSIS  Your caregiver can usually tell what is wrong by performing a physical exam. Skin or blood tests may also be done to determine the cause of your hives. In some cases, the cause cannot be determined. TREATMENT  Mild cases usually get better with medicines such as antihistamines. Severe cases may require an emergency epinephrine injection. If the cause of your hives is known, treatment includes avoiding that trigger.  HOME CARE INSTRUCTIONS   Avoid causes that trigger your hives.  Take antihistamines as directed by your caregiver to reduce the severity of your hives. Non-sedating or low-sedating antihistamines are usually recommended. Do not drive while taking an antihistamine.  Take any other medicines prescribed for itching as directed by your caregiver.  Wear loose-fitting clothing.  Keep all follow-up appointments as directed by your caregiver. SEEK  MEDICAL CARE IF:   You have persistent or severe itching that is not relieved with medicine.  You have painful or swollen joints. SEEK IMMEDIATE MEDICAL CARE IF:   You have a fever.  Your tongue or lips are swollen.  You have trouble breathing or swallowing.  You feel tightness in the throat or chest.  You have abdominal pain. These problems may be the first sign of a life-threatening allergic reaction. Call your local emergency services (911 in [place] MAKE SURE YOU:   Understand these instructions.  Will watch your condition.  Will get help right away if you are not doing well or get worse. Document Released: 10/04/2005 Document Revised: 04/04/2012 Document Reviewed: 12/28/2011 [facility] Patient Information 2014 [place].  Nausea and Vomiting Nausea is a sick feeling that often comes before throwing up (vomiting). Vomiting is a reflex where stomach contents come out of your mouth. Vomiting can cause severe loss of body fluids (dehydration). Children and elderly adults can become dehydrated quickly, especially if they also have diarrhea. Nausea and vomiting are symptoms of a condition or disease. It is important to find the cause of your symptoms. CAUSES   Direct irritation of the stomach lining. This irritation can result from increased acid production (gastroesophageal reflux disease), infection, food poisoning, taking certain medicines (such as nonsteroidal anti-inflammatory drugs), alcohol use, or tobacco use.  Signals from the brain.These signals could be caused by a headache, heat exposure, an inner ear disturbance, increased pressure in the brain from injury, infection, a tumor, or a concussion, pain, emotional stimulus, or metabolic problems.  An obstruction in the gastrointestinal tract (  bowel obstruction).  Illnesses such as diabetes, hepatitis, gallbladder problems, appendicitis, kidney problems, cancer, sepsis, atypical symptoms of a heart attack, or eating  disorders.  Medical treatments such as chemotherapy and radiation.  Receiving medicine that makes you sleep (general anesthetic) during surgery. DIAGNOSIS Your caregiver may ask for tests to be done if the problems do not improve after a few days. Tests may also be done if symptoms are severe or if the reason for the nausea and vomiting is not clear. Tests may include:  Urine tests.  Blood tests.  Stool tests.  Cultures (to look for evidence of infection).  X-rays or other imaging studies. Test results can help your caregiver make decisions about treatment or the need for additional tests. TREATMENT You need to stay well hydrated. Drink frequently but in small amounts.You may wish to drink water, sports drinks, clear broth, or eat frozen ice pops or gelatin dessert to help stay hydrated.When you eat, eating slowly may help prevent nausea.There are also some antinausea medicines that may help prevent nausea. HOME CARE INSTRUCTIONS   Take all medicine as directed by your caregiver.  If you do not have an appetite, do not force yourself to eat. However, you must continue to drink fluids.  If you have an appetite, eat a normal diet unless your caregiver tells you differently.  Eat a variety of complex carbohydrates (rice, wheat, potatoes, bread), lean meats, yogurt, fruits, and vegetables.  Avoid high-fat foods because they are more difficult to digest.  Drink enough water and fluids to keep your urine clear or pale yellow.  If you are dehydrated, ask your caregiver for specific rehydration instructions. Signs of dehydration may include:  Severe thirst.  Dry lips and mouth.  Dizziness.  Dark urine.  Decreasing urine frequency and amount.  Confusion.  Rapid breathing or pulse. SEEK IMMEDIATE MEDICAL CARE IF:   You have blood or brown flecks (like coffee grounds) in your vomit.  You have black or bloody stools.  You have a severe headache or stiff neck.  You are  confused.  You have severe abdominal pain.  You have chest pain or trouble breathing.  You do not urinate at least once every 8 hours.  You develop cold or clammy skin.  You continue to vomit for longer than 24 to 48 hours.  You have a fever. MAKE SURE YOU:   Understand these instructions.  Will watch your condition.  Will get help right away if you are not doing well or get worse. Document Released: 10/04/2005 Document Revised: 12/27/2011 Document Reviewed: 03/03/2011 [facility] Patient Information 2014 [place], [place].

## 2013-06-23 IMAGING — CR DG TIBIA/FIBULA 2V*L*
4 series · 4 of 4 positions shown · non-contrast
Comparison: Views of the left hip 05/15/2012

CLINICAL DATA: Fell 1 week ago.  Hip pain.  Posterior calf pain.

LEFT TIBIA AND FIBULA - 2 VIEW

[t tib-fib ap left (1 of 2)]
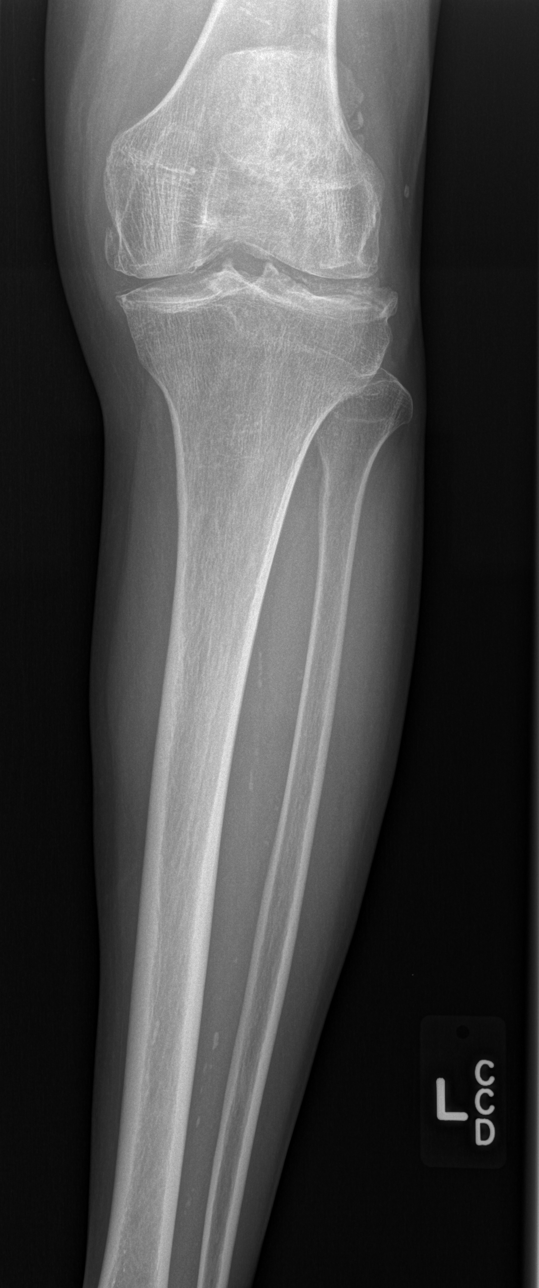

[t tib-fib ap left (2 of 2)]
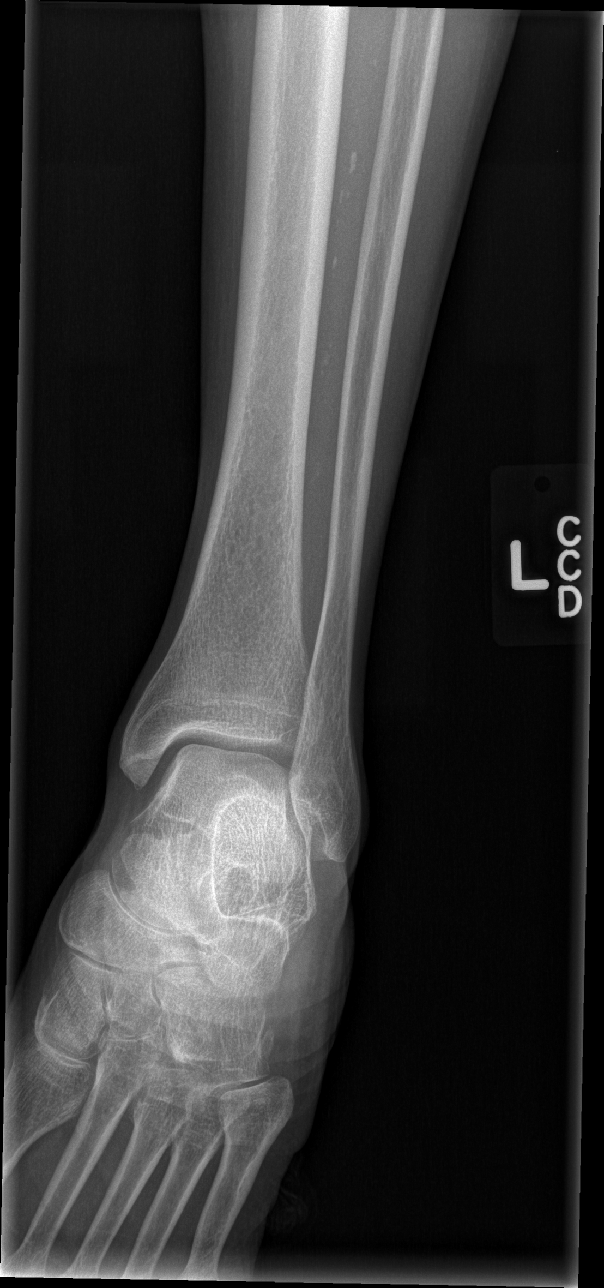

[t tib-fib lat left (1 of 2)]
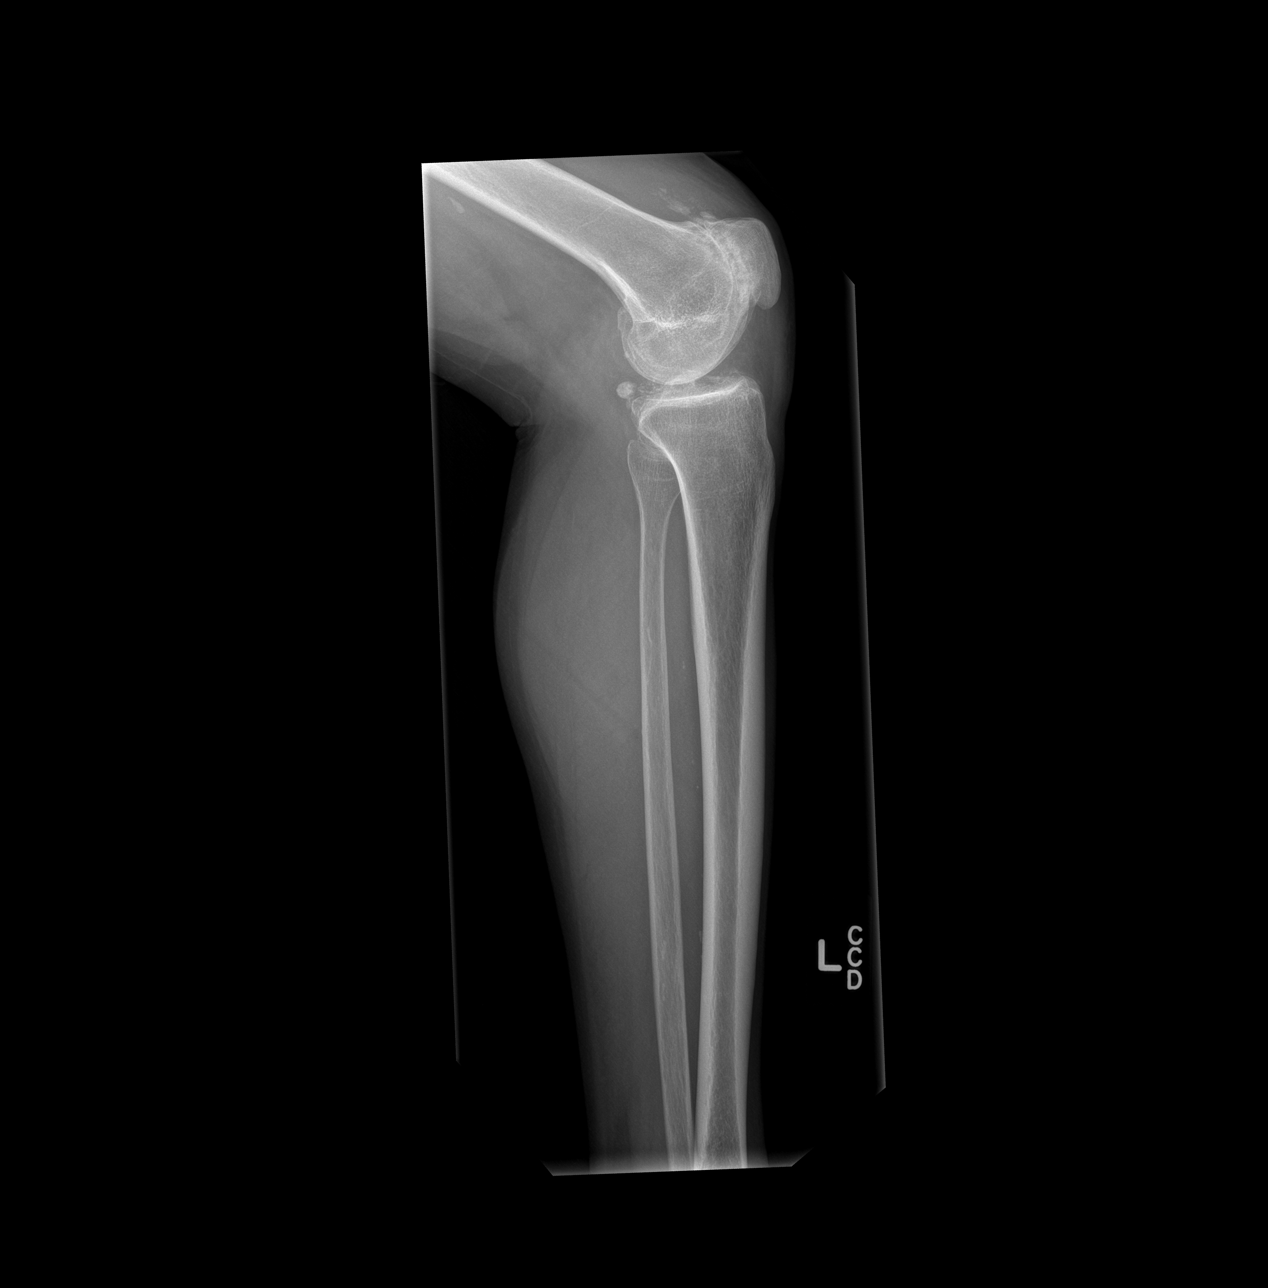

[t tib-fib lat left (2 of 2)]
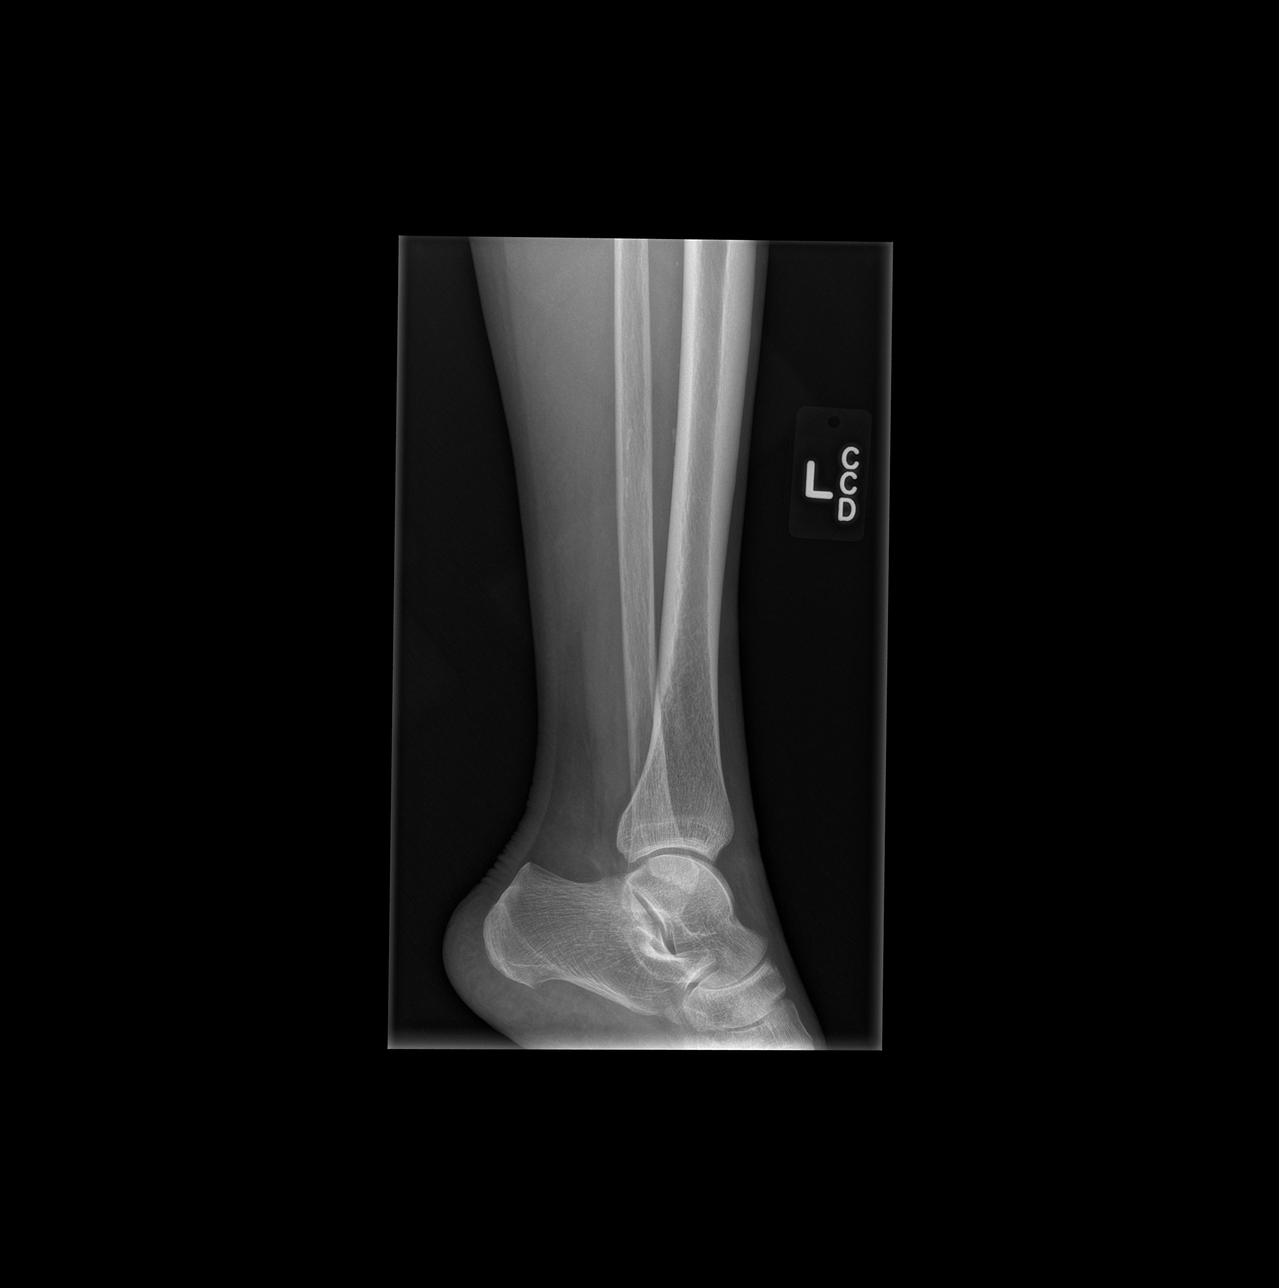

[4 of 4 positions shown; findings below may reference images not displayed]

FINDINGS: There is degenerative change of the knee, with associated
chondrocalcinosis.  There is no evidence for acute fracture or
subluxation.  Vascular calcifications are present.
IMPRESSION: 1.  Degenerative changes at the knee.
2. No evidence for acute  abnormality.

## 2013-06-23 IMAGING — CR DG HIP (WITH OR WITHOUT PELVIS) 2-3V*L*
3 series · 3 of 3 positions shown · non-contrast
Comparison: None.

CLINICAL DATA: Hip pain post fall 1 week ago

LEFT HIP - COMPLETE 2+ VIEW

[t pelvis ap]
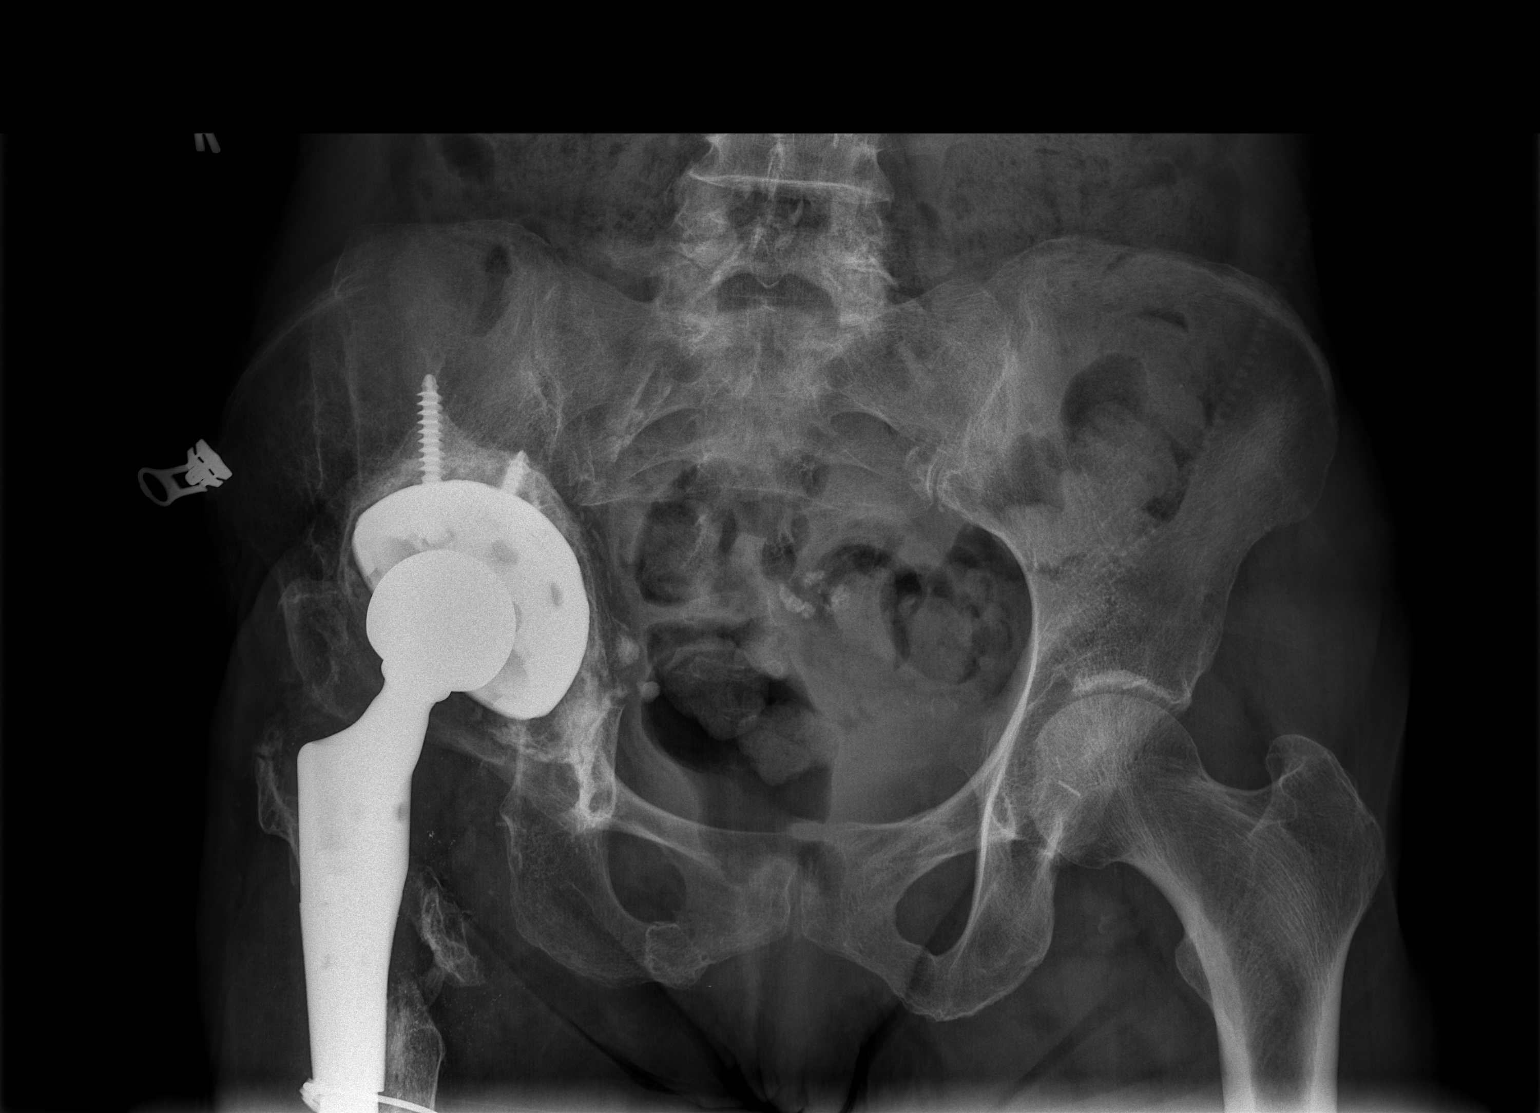

[t hip ap left]
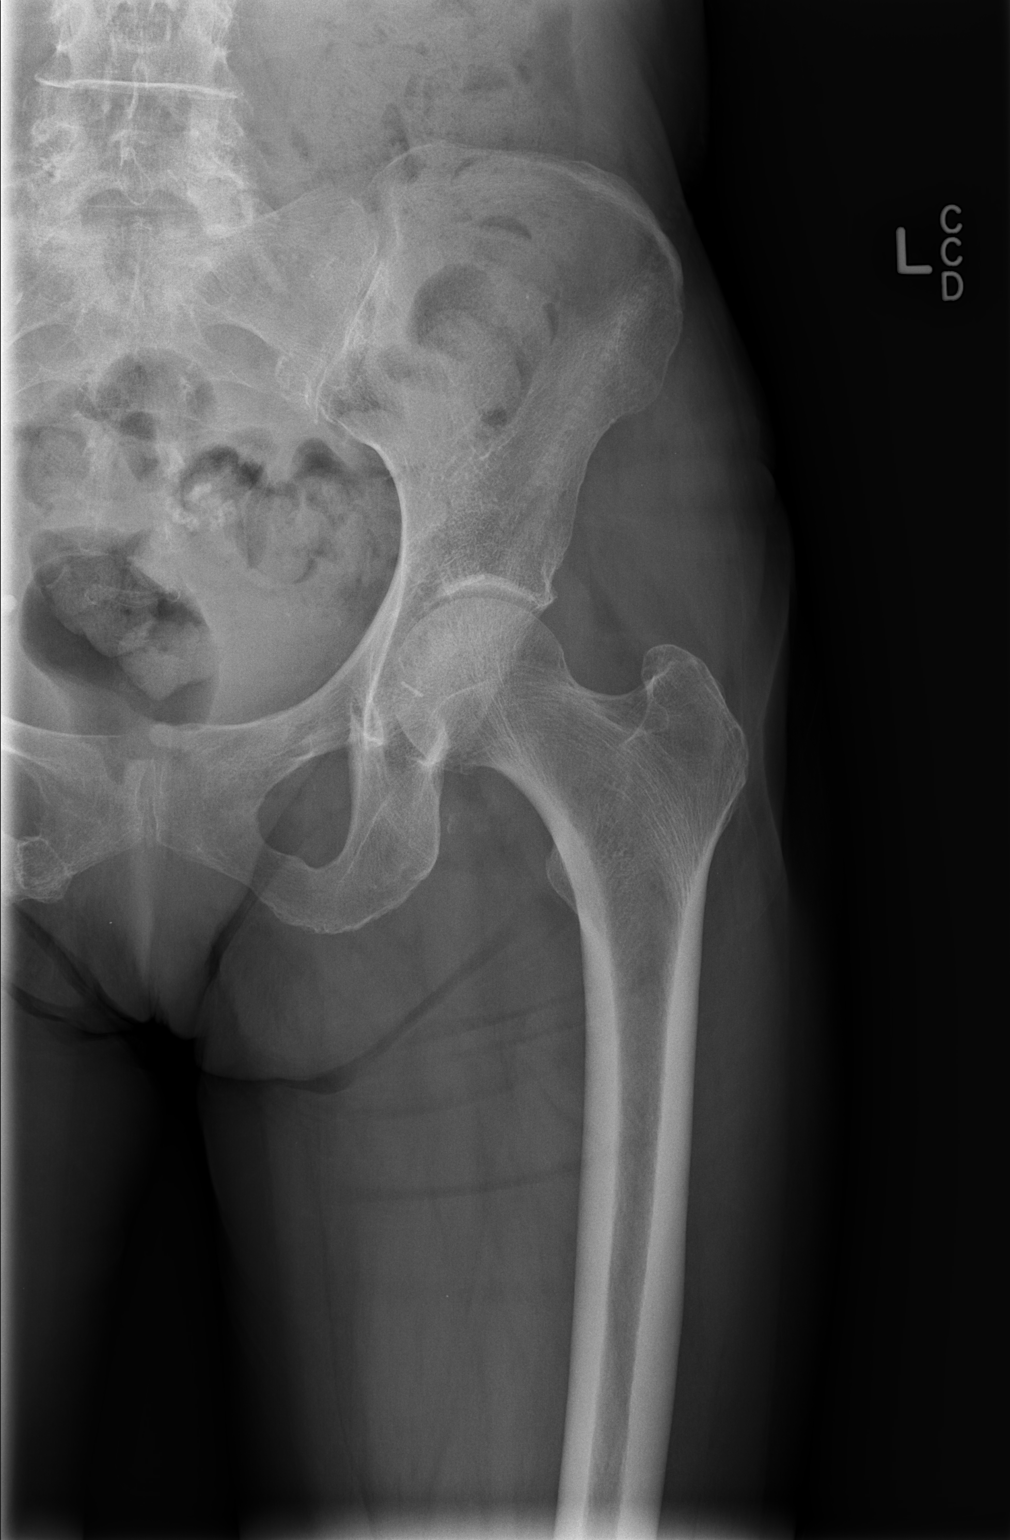

[t hip frog leg left]
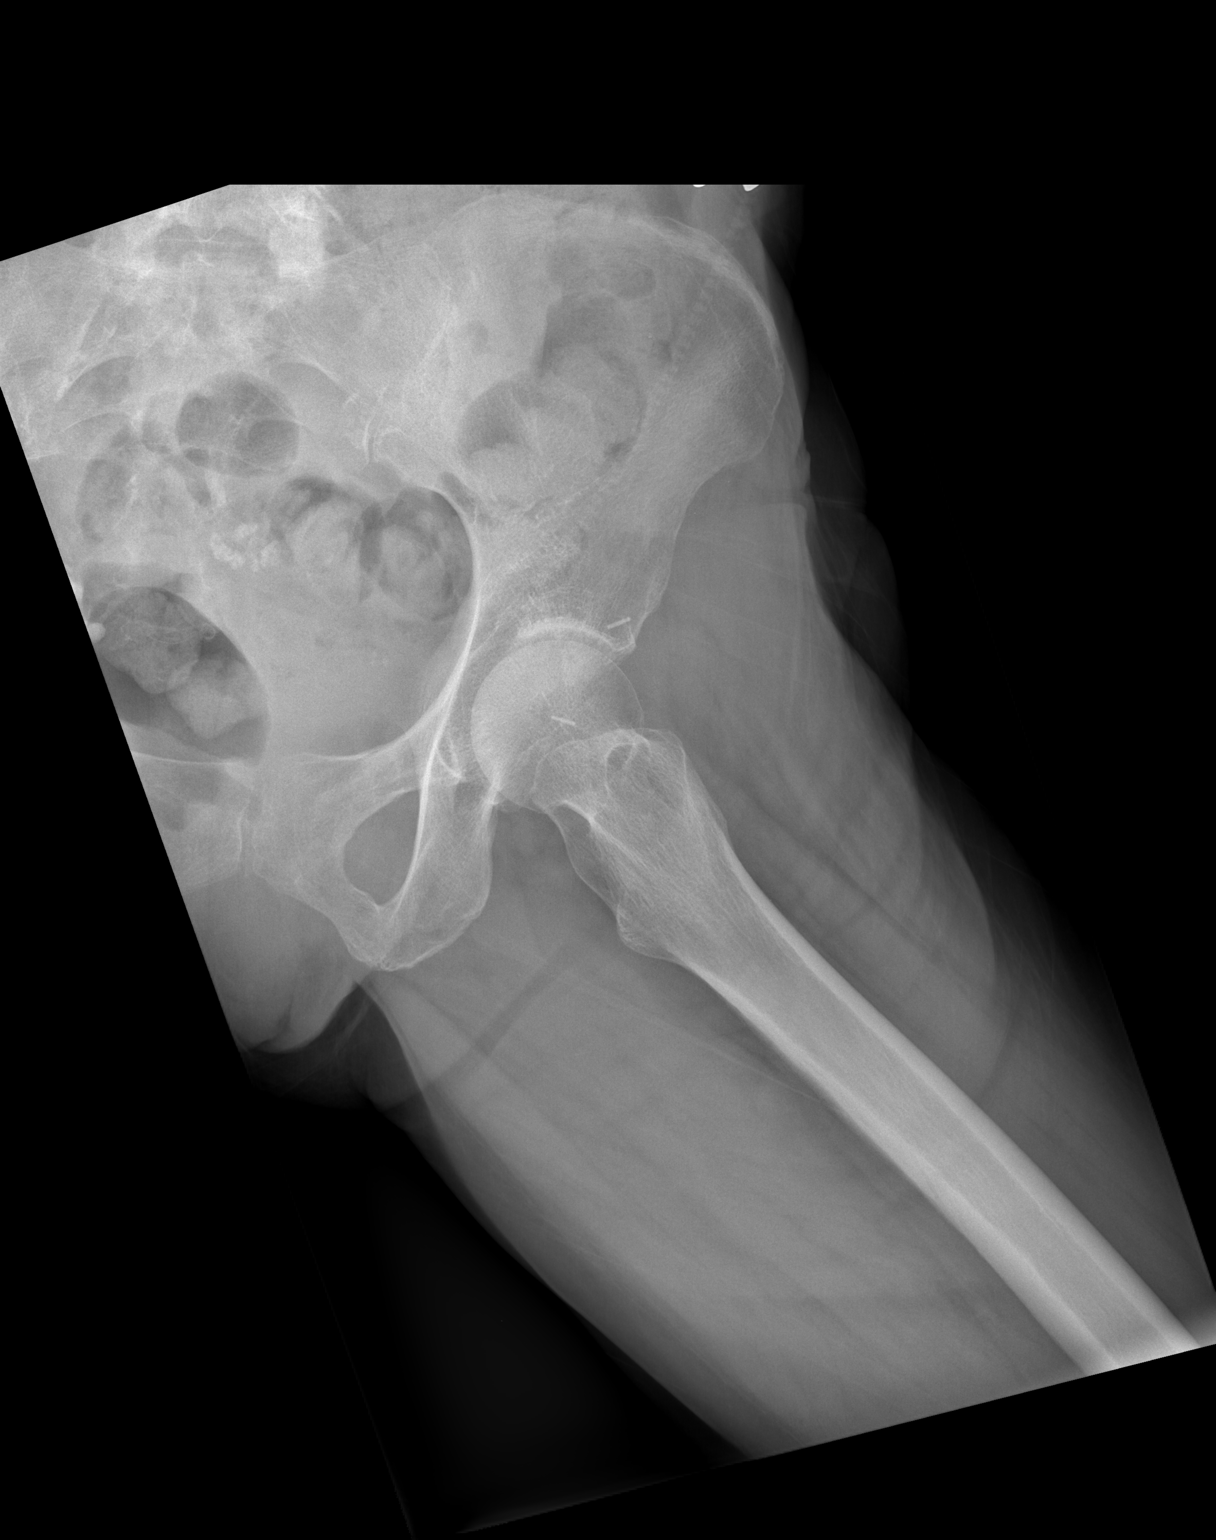

[3 of 3 positions shown; findings below may reference images not displayed]

FINDINGS: Three views of the left hip submitted.  No acute fracture
or subluxation.  There is diffuse osteopenia.  There is right hip
prosthesis in anatomic alignment.  Old fractures of the right
superior and inferior pubic ramus.
IMPRESSION: No left hip fracture or subluxation.  Diffuse osteopenia right hip
prosthesis. Old fractures of the right superior and inferior pubic
ramus.

## 2013-06-23 NOTE — ED Provider Notes (Signed)
Medical screening examination/treatment/procedure(s) were performed by non-physician practitioner and as supervising physician I was immediately available for consultation/collaboration.  Houston Siren, MD 06/23/13 [phone]

## 2013-10-21 IMAGING — CR DG CHEST 2V
2 series · 2 of 2 positions shown · non-contrast
Comparison: 07/14/2011

CLINICAL DATA: Dizziness, weakness, smoking history

CHEST - 2 VIEW

[w chest pa]
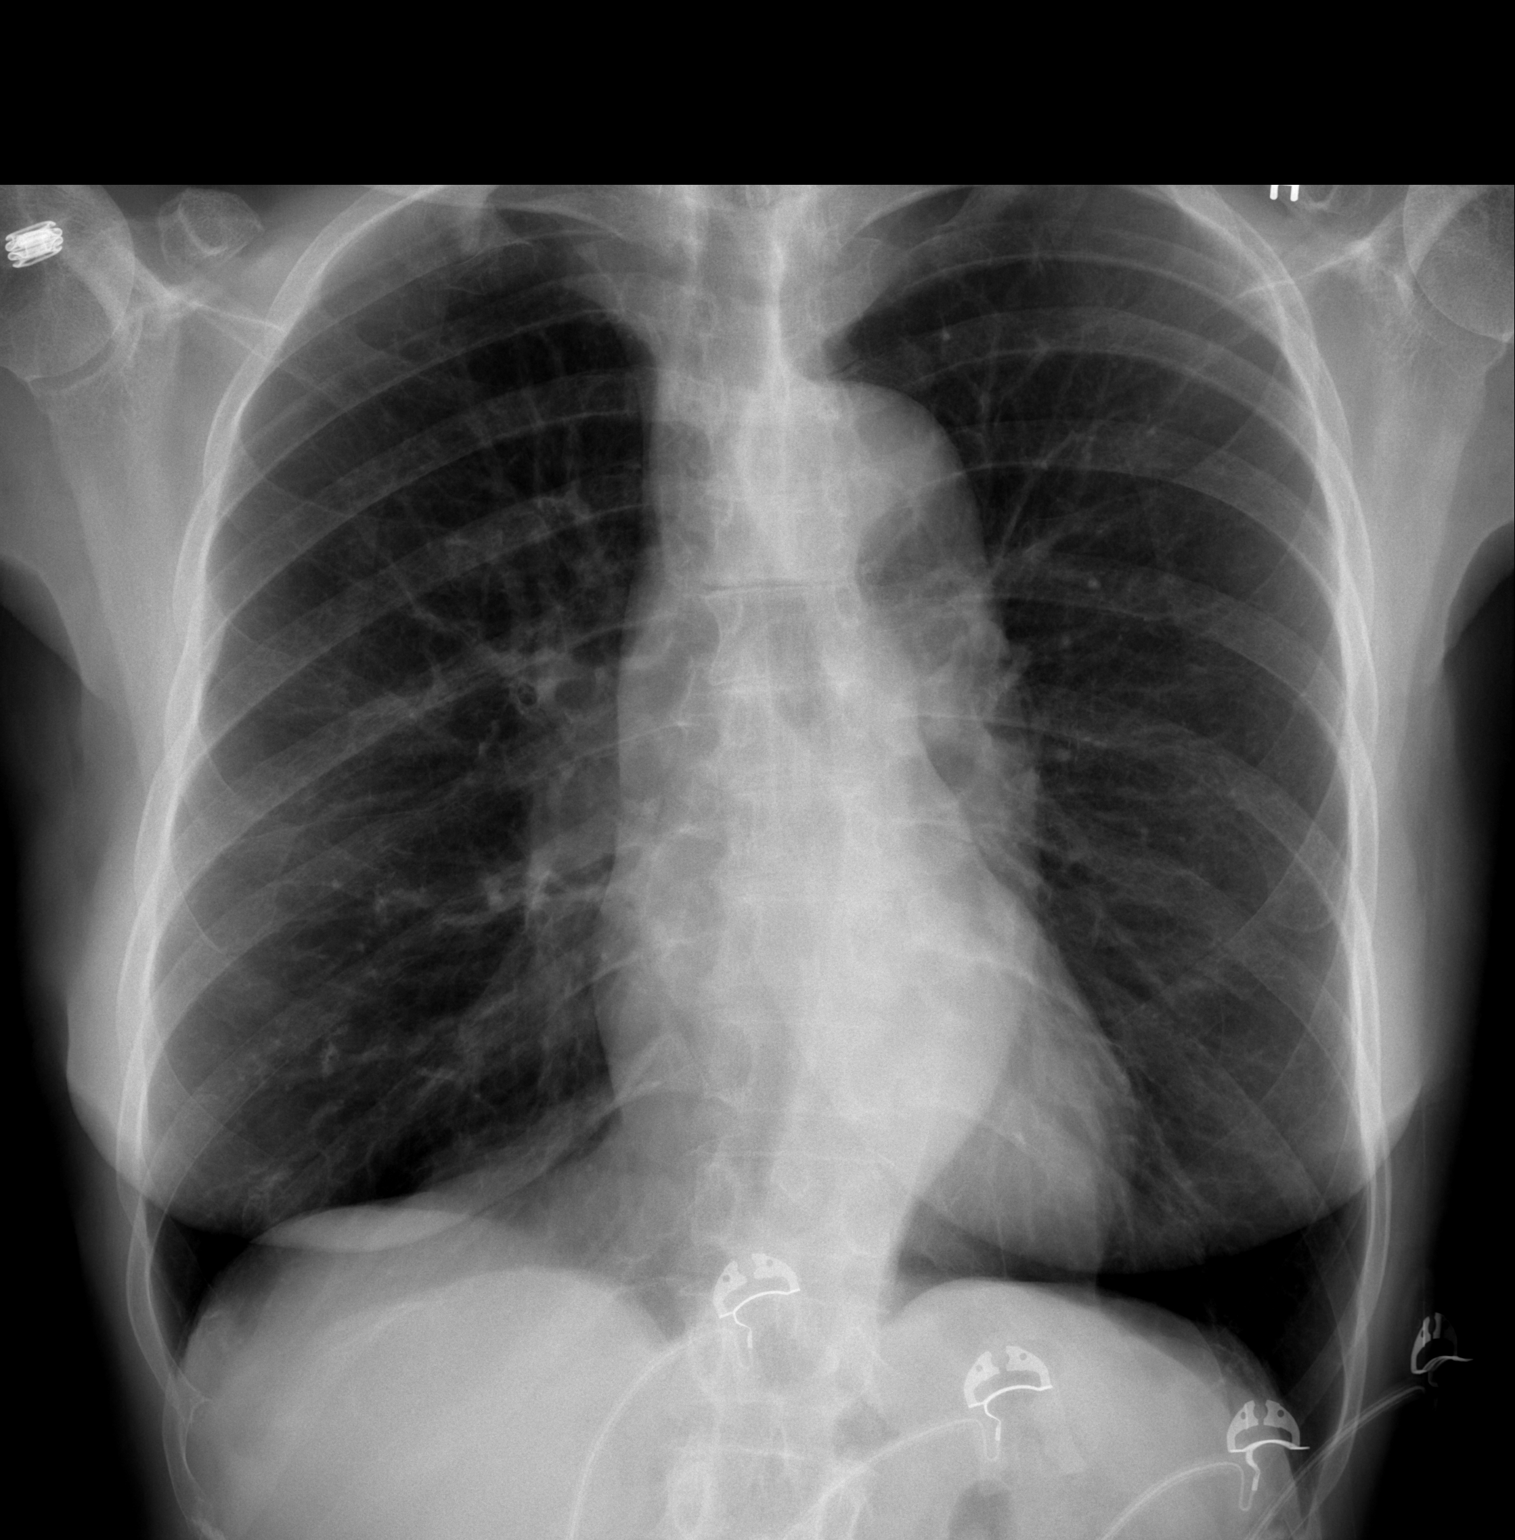

[w chest lat]
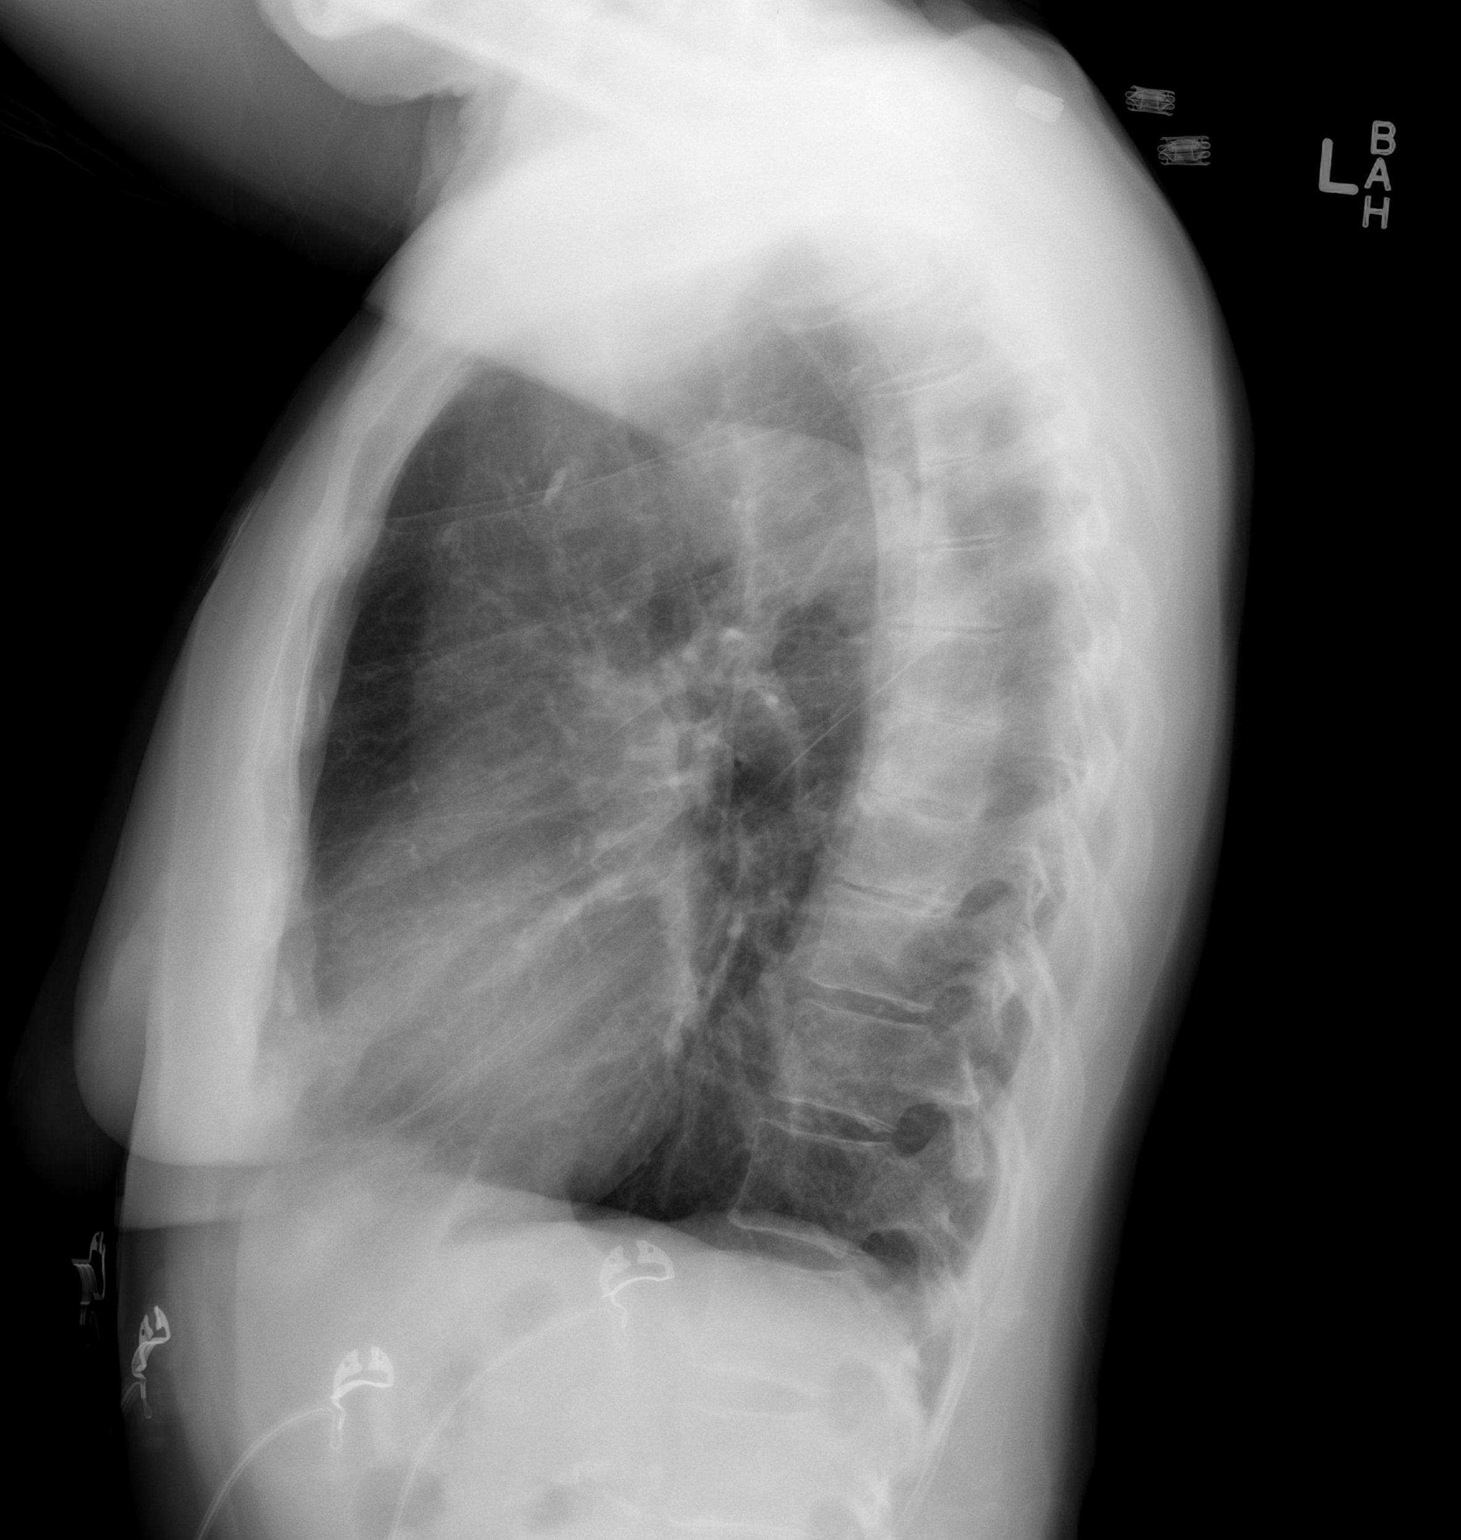

[2 of 2 positions shown; findings below may reference images not displayed]

FINDINGS: Hyperinflation indicates COPD.  Heart size and vascular
pattern are normal.  Lungs are clear.  There is no change from the
prior study.
IMPRESSION: No acute findings.

## 2013-10-21 IMAGING — CT CT HEAD W/O CM
1 series · 16 of 30 positions shown, 20 images · non-contrast
Comparison: 02/21/2009 and earlier.

CLINICAL DATA: 71-year-old female dizziness and lightheaded.
Syncope 2 days ago.

CT HEAD WITHOUT CONTRAST
TECHNIQUE: Contiguous axial images were obtained from the base of
the skull through the vertex without contrast.

[Series 2: head routine 4.8 h37s · axial · 0.43mm/px · z∈[+19,+152]mm · 16 of 30 slices shown, 20 images]
[im 2/30  brain]
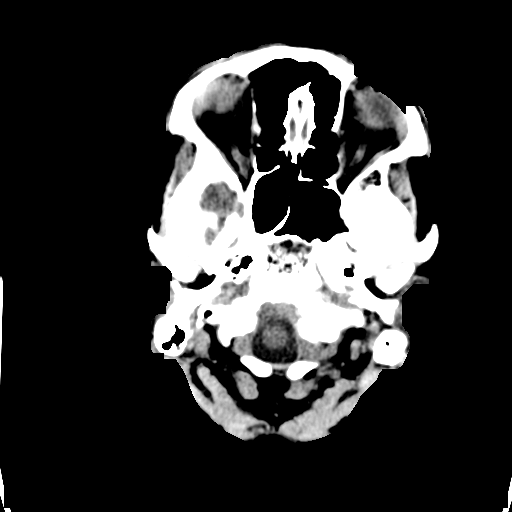
[im 2/30  bone]
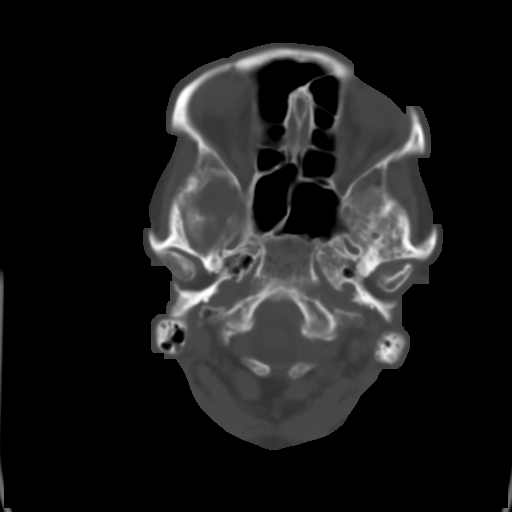
[im 4/30  brain]
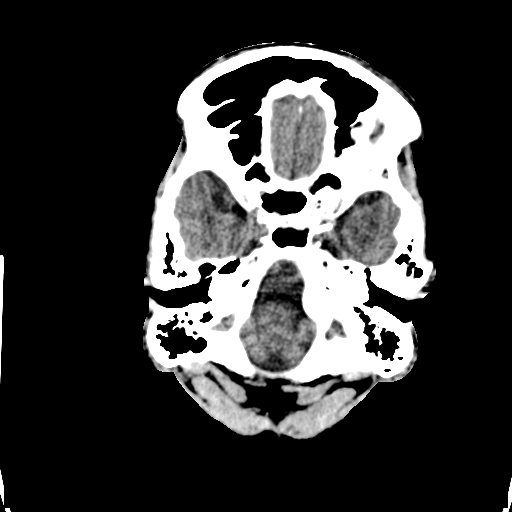
[im 6/30  brain]
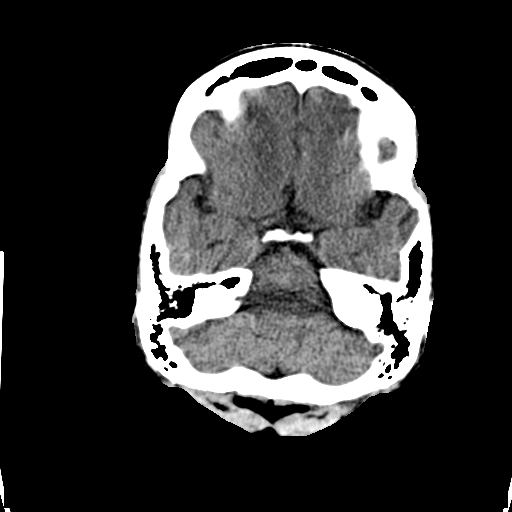
[im 8/30  brain]
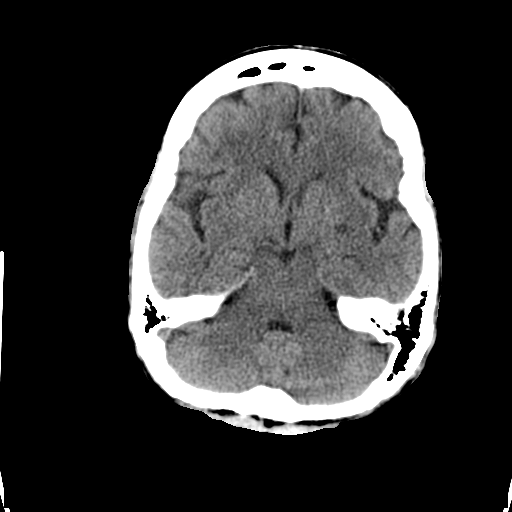
[im 9/30  brain]
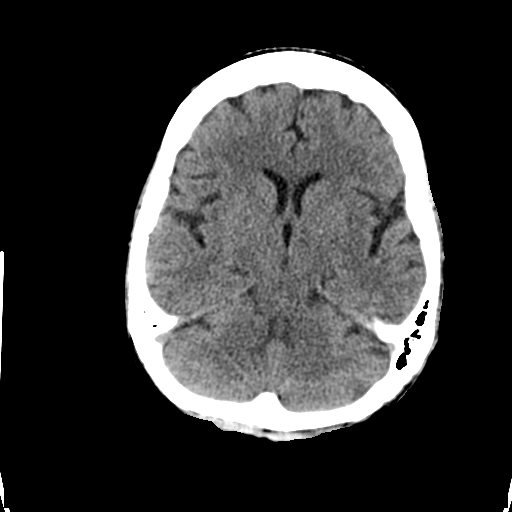
[im 9/30  bone]
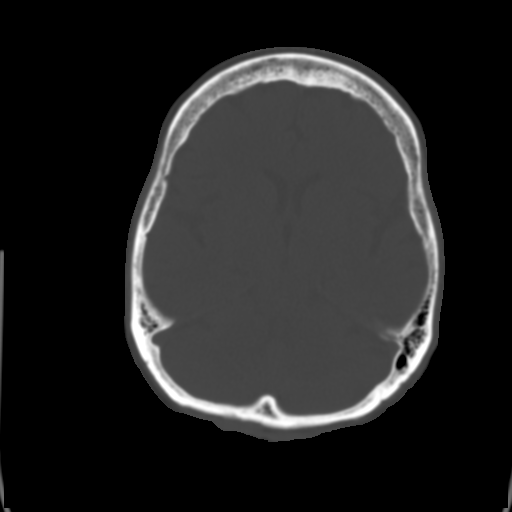
[im 11/30  brain]
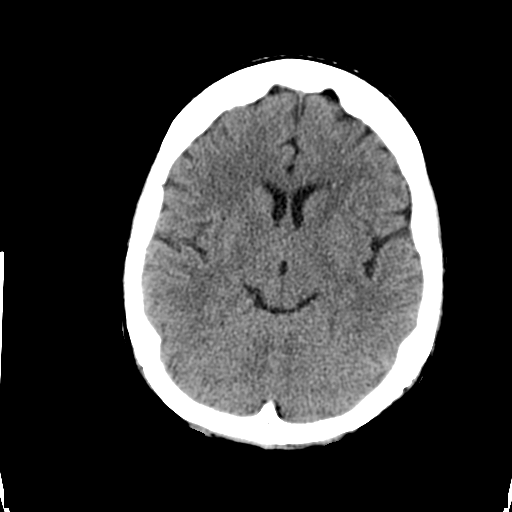
[im 13/30  brain]
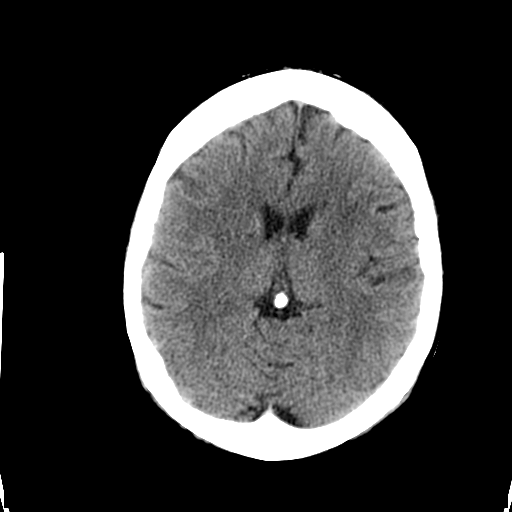
[im 15/30  brain]
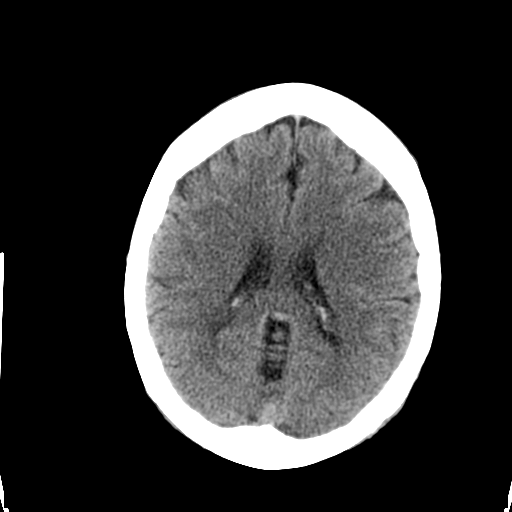
[im 16/30  brain]
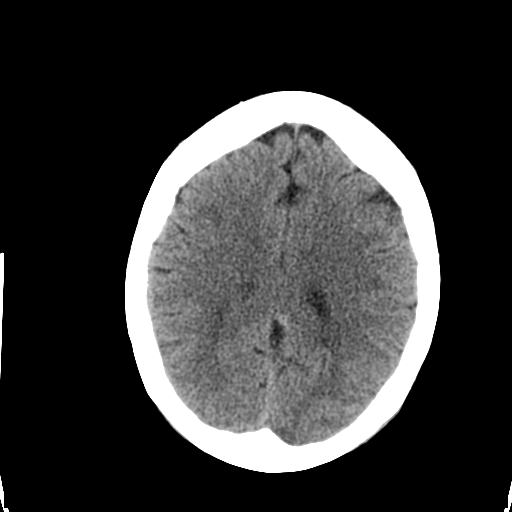
[im 16/30  bone]
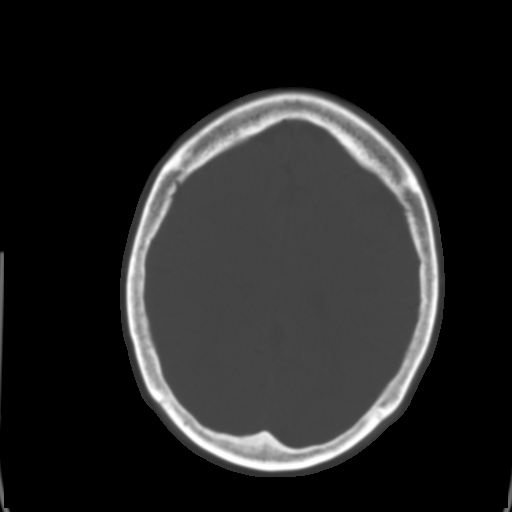
[im 18/30  brain]
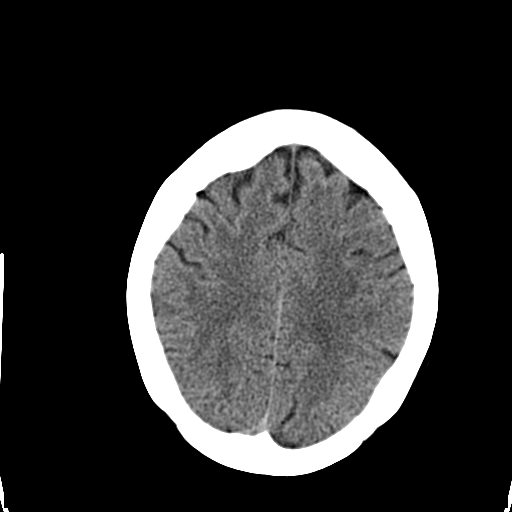
[im 20/30  brain]
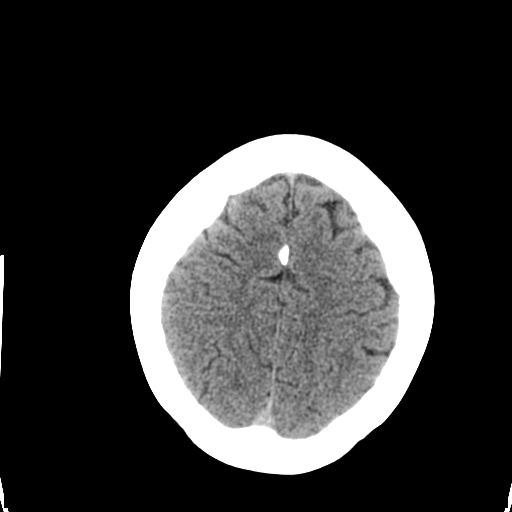
[im 22/30  brain]
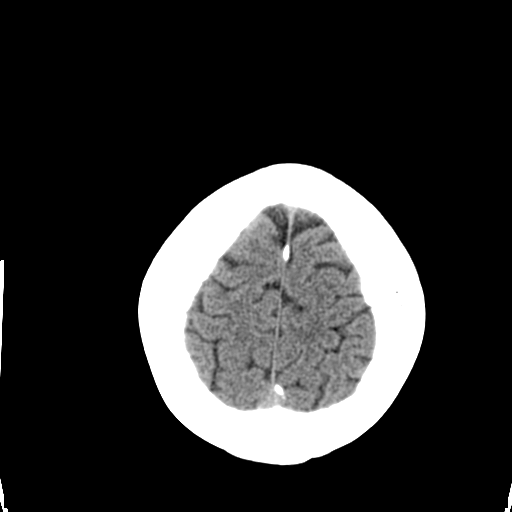
[im 23/30  brain]
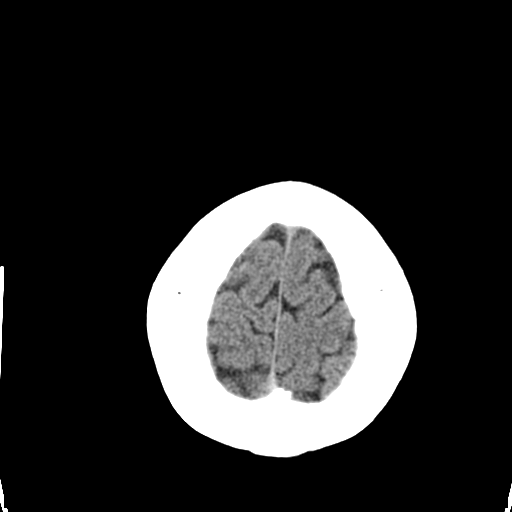
[im 23/30  bone]
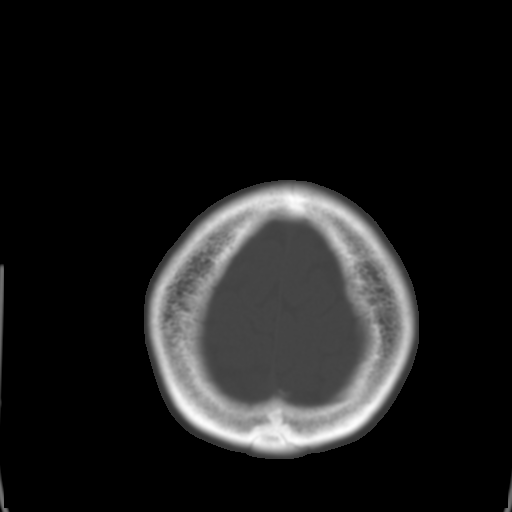
[im 25/30  brain]
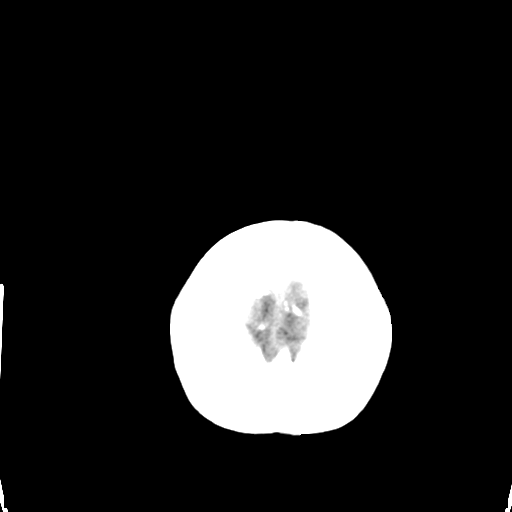
[im 27/30  brain]
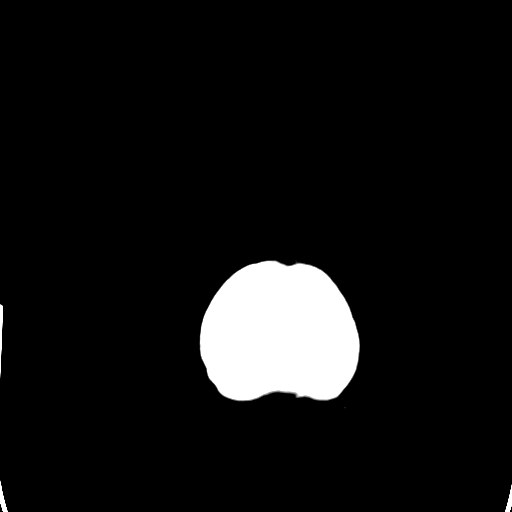
[im 29/30  brain]
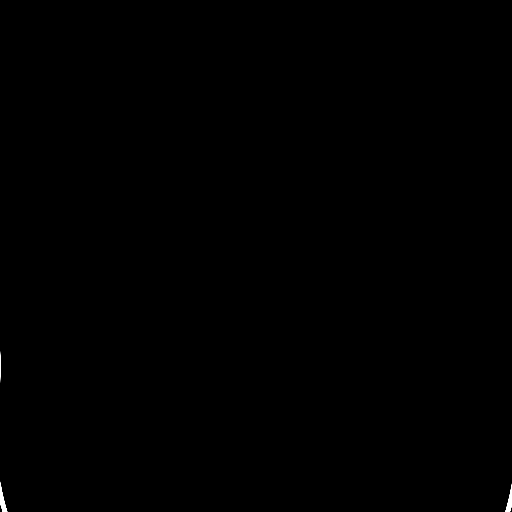

[16 of 30 positions shown; findings below may reference images not displayed]

FINDINGS: Visualized paranasal sinuses and mastoids are clear.  No
acute osseous abnormality identified.  Visualized orbit soft
tissues are within normal limits.  No scalp hematoma identified.

Calcified atherosclerosis at the skull base.  Cerebral volume is
within normal limits for age.  No midline shift, ventriculomegaly,
mass effect, evidence of mass lesion, intracranial hemorrhage or
evidence of cortically based acute infarction. Minimal to mild
cerebral white matter hypodensity.  Small dilated perivascular
space at the inferior left basal ganglia.  Remaining gray-white
matter differentiation is within normal limits throughout the
brain.  No suspicious intracranial vascular hyperdensity.
IMPRESSION: Stable and negative for age noncontrast CT appearance of the brain.

## 2014-01-27 ENCOUNTER — Emergency Department (HOSPITAL_COMMUNITY)
Admission: EM | Admit: 2014-01-27 | Discharge: 2014-01-27 | Disposition: A | Discharge status: Home / Self Care | Payer: Medicare Other | Attending: Emergency Medicine | Admitting: Emergency Medicine

## 2014-01-27 ENCOUNTER — Encounter (HOSPITAL_COMMUNITY): Payer: Self-pay | Admitting: Emergency Medicine

## 2014-01-27 DIAGNOSIS — M5432 Sciatica, left side: Secondary | ICD-10-CM

## 2014-01-27 DIAGNOSIS — F411 Generalized anxiety disorder: Secondary | ICD-10-CM | POA: Insufficient documentation

## 2014-01-27 DIAGNOSIS — E785 Hyperlipidemia, unspecified: Secondary | ICD-10-CM | POA: Diagnosis not present

## 2014-01-27 DIAGNOSIS — Z79899 Other long term (current) drug therapy: Secondary | ICD-10-CM | POA: Insufficient documentation

## 2014-01-27 DIAGNOSIS — I10 Essential (primary) hypertension: Secondary | ICD-10-CM | POA: Insufficient documentation

## 2014-01-27 DIAGNOSIS — M79609 Pain in unspecified limb: Secondary | ICD-10-CM | POA: Diagnosis present

## 2014-01-27 DIAGNOSIS — M5430 Sciatica, unspecified side: Secondary | ICD-10-CM | POA: Insufficient documentation

## 2014-01-27 DIAGNOSIS — R269 Unspecified abnormalities of gait and mobility: Secondary | ICD-10-CM | POA: Insufficient documentation

## 2014-01-27 DIAGNOSIS — F17200 Nicotine dependence, unspecified, uncomplicated: Secondary | ICD-10-CM | POA: Insufficient documentation

## 2014-01-27 DIAGNOSIS — M129 Arthropathy, unspecified: Secondary | ICD-10-CM | POA: Insufficient documentation

## 2014-01-27 DIAGNOSIS — Z96649 Presence of unspecified artificial hip joint: Secondary | ICD-10-CM | POA: Insufficient documentation

## 2014-01-27 MED ORDER — OXYCODONE HCL 5 MG PO CAPS
5.0000 mg | ORAL_CAPSULE | ORAL | Status: DC | PRN
Start: 2014-01-27 — End: 2014-03-27

## 2014-01-27 MED ORDER — MELOXICAM 7.5 MG PO TABS: 15.0000 mg | ORAL_TABLET | Freq: Every day | ORAL | Status: DC

## 2014-01-27 NOTE — ED Provider Notes (Signed)
Patient reports 4-5 days of pain that starts in her left SI joint area down her left buttock down the posterior portion of her left lower leg into her left foot. She states it's both pain and numbness. She denies any back pain. She states she's never had this before.  Patient is awake and cooperative. She has no tenderness in her left hip joint.  Medical screening examination/treatment/procedure(s) were conducted as a shared visit with non-physician practitioner(s) and myself.  I personally evaluated the patient during the encounter.   EKG Interpretation None       Rolland Porter, MD, [facility]   Janice Norrie, MD 01/27/14 [phone]

## 2014-01-27 NOTE — ED Notes (Signed)
Pt presents to ED via POV from home with complaints of left leg pain from hip down to ankle in the back of leg for 3-4 days.

## 2014-01-27 NOTE — ED Provider Notes (Signed)
CSN: [id number]     Arrival date & time 01/27/14  1358 History  This chart was scribed for non-physician practitioner, Noland Fordyce, PA-C working with Janice Norrie, MD by Frederich Balding, ED scribe. This patient was seen in room TR08C/TR08C and the patient's care was started at 2:28 PM.   Chief Complaint  Patient presents with  . Leg Pain   The history is provided by the patient. No language interpreter was used.   HPI Comments: Alyssa [name] is a 73 y.o. female with history of right hip replacement 3 years ago who presents to the Emergency Department complaining of gradual onset, constant, burning left leg pain that started 4-5 days ago. She states the pain starts into her hip and goes into her toes. Denies fall or trauma to her leg but states she does a lot of walking. Pt has taken oxycodone with temporary relief. Denies bowel or bladder incontinence, difficulty urinating, constipation.   Past Medical History  Diagnosis Date  . Hyperlipidemia   . Hypertension   . Leg pain   . Joint pain   . Headache(784.0)   . Anxiety   . Arthritis    Past Surgical History  Procedure Laterality Date  . Total hip arthroplasty      right  . 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 Heart attack Mother   . Heart attack Father   . Cancer Sister   . Aneurysm Brother   . Cancer Brother    History  Substance Use Topics  . Smoking status: Current Every Day Smoker -- 0.50 packs/day    Types: Cigarettes  . Smokeless tobacco: Not on file  . Alcohol Use: No   OB History   Grav Para Term Preterm Abortions TAB SAB Ect Mult Living                 Review of Systems  Gastrointestinal: Negative for constipation.  Genitourinary: Negative for difficulty urinating.       Negative for bowel or bladder incontinence.   Musculoskeletal: Positive for myalgias.  All other systems reviewed and are negative.  Allergies  Review of patient's allergies  indicates no known allergies.  Home Medications   Current Outpatient Rx  Name  Route  Sig  Dispense  Refill  . Amlodipine-Valsartan-HCTZ 10-320-25 MG TABS   Oral   Take 1 tablet by mouth daily.         Marland Kitchen aspirin-acetaminophen-caffeine (EXCEDRIN MIGRAINE) 250-250-65 MG per tablet   Oral   Take 1 tablet by mouth every 6 (six) hours as needed for headache.         . busPIRone (BUSPAR) 15 MG tablet   Oral   Take 15 mg by mouth 2 (two) times daily as needed. For anxiety.         . diphenhydrAMINE (BENADRYL) 25 MG tablet   Oral   Take 25 mg by mouth every 6 (six) hours as needed for allergies.         . Multiple Vitamins-Calcium (ONE-A-DAY WOMENS PO)   Oral   Take 1 tablet by mouth daily.         . rosuvastatin (CRESTOR) 10 MG tablet   Oral   Take 10 mg by mouth daily.           . SUMAtriptan (IMITREX) 100 MG tablet   Oral   Take 100 mg by mouth daily as needed. Prn migraine         .  zolpidem (AMBIEN) 10 MG tablet   Oral   Take 10 mg by mouth at bedtime as needed. For sleep         . meloxicam (MOBIC) 7.5 MG tablet   Oral   Take 2 tablets (15 mg total) by mouth daily.   15 tablet   0   . oxycodone (OXY-IR) 5 MG capsule   Oral   Take 1 capsule (5 mg total) by mouth every 4 (four) hours as needed for pain.   15 capsule   0    BP 121/84  Pulse 88  Resp 14  Ht 5\' 2"  (1.575 m)  Wt 110 lb (49.896 kg)  BMI 20.11 kg/m2  SpO2 100%  Physical Exam  Nursing note and vitals reviewed. Constitutional: She is oriented to person, place, and time. She appears well-developed and well-nourished.  HENT:  Head: Normocephalic and atraumatic.  Eyes: EOM are normal.  Neck: Normal range of motion.  Cardiovascular: Normal rate.   Pulmonary/Chest: Effort normal.  Musculoskeletal: Normal range of motion.  Tenderness in left lower lumbar musculature, left buttock and posterior left thigh. No deformity. Pedal pulses are 2+. No calf tenderness.  Neurological: She is  alert and oriented to person, place, and time.  Antalgic gait. Pt uses cane for ambulation.   Skin: Skin is warm and dry.  Psychiatric: She has a normal mood and affect. Her behavior is normal.    ED Course  Procedures (including critical care time)  DIAGNOSTIC STUDIES: Oxygen Saturation is 100% on RA, normal by my interpretation.    COORDINATION OF CARE: 2:31 PM-Discussed treatment plan which includes pain medication and an anti-inflammatory with pt at bedside and pt agreed to plan.  Advised pt to alternate warm and cold compresses and to follow up with her PCP. Pt also discussed with Dr. Rolland Porter who also examined pt and agrees with plan.  Labs Review Labs Reviewed - No data to display Imaging Review No results found.   EKG Interpretation None      MDM   Final diagnoses:  Left sciatic nerve pain    I personally performed the services described in this documentation, which was scribed in my presence. The recorded information has been reviewed and is accurate.  Noland Fordyce, PA-C 02/04/14 1307

## 2014-01-27 NOTE — Discharge Instructions (Signed)
Back Pain, Adult  Back pain is very common. The pain often gets better over time. The cause of back pain is usually not dangerous. Most people can learn to manage their back pain on their own.   HOME CARE   · Stay active. Start with short walks on flat ground if you can. Try to walk farther each day.  · Do not sit, drive, or stand in one place for more than 30 minutes. Do not stay in bed.  · Do not avoid exercise or work. Activity can help your back heal faster.  · Be careful when you bend or lift an object. Bend at your knees, keep the object close to you, and do not twist.  · Sleep on a firm mattress. Lie on your side, and bend your knees. If you lie on your back, put a pillow under your knees.  · Only take medicines as told by your doctor.  · Put ice on the injured area.  · Put ice in a plastic bag.  · Place a towel between your skin and the bag.  · Leave the ice on for 15-20 minutes, 03-04 times a day for the first 2 to 3 days. After that, you can switch between ice and heat packs.  · Ask your doctor about back exercises or massage.  · Avoid feeling anxious or stressed. Find good ways to deal with stress, such as exercise.  GET HELP RIGHT AWAY IF:   · Your pain does not go away with rest or medicine.  · Your pain does not go away in 1 week.  · You have new problems.  · You do not feel well.  · The pain spreads into your legs.  · You cannot control when you poop (bowel movement) or pee (urinate).  · Your arms or legs feel weak or lose feeling (numbness).  · You feel sick to your stomach (nauseous) or throw up (vomit).  · You have belly (abdominal) pain.  · You feel like you may pass out (faint).  MAKE SURE YOU:   · Understand these instructions.  · Will watch your condition.  · Will get help right away if you are not doing well or get worse.  Document Released: 03/22/2008 Document Revised: 12/27/2011 Document Reviewed: 02/22/2011  [employer]® Patient Information ©2014 [employer].

## 2014-01-27 NOTE — ED Notes (Signed)
Patient discharged to home with family. NAD.  

## 2014-02-05 NOTE — ED Provider Notes (Signed)
See prior note   Janice Norrie, MD 02/05/14 [phone]

## 2014-03-27 ENCOUNTER — Emergency Department (HOSPITAL_COMMUNITY)
Admission: EM | Admit: 2014-03-27 | Discharge: 2014-03-28 | Disposition: A | Discharge status: Home / Self Care | Payer: Medicare Other | Attending: Emergency Medicine | Admitting: Emergency Medicine

## 2014-03-27 ENCOUNTER — Encounter (HOSPITAL_COMMUNITY): Payer: Self-pay | Admitting: Emergency Medicine

## 2014-03-27 DIAGNOSIS — N289 Disorder of kidney and ureter, unspecified: Secondary | ICD-10-CM | POA: Diagnosis not present

## 2014-03-27 DIAGNOSIS — R197 Diarrhea, unspecified: Secondary | ICD-10-CM | POA: Diagnosis not present

## 2014-03-27 DIAGNOSIS — M79609 Pain in unspecified limb: Secondary | ICD-10-CM | POA: Diagnosis present

## 2014-03-27 DIAGNOSIS — I10 Essential (primary) hypertension: Secondary | ICD-10-CM | POA: Insufficient documentation

## 2014-03-27 DIAGNOSIS — M5430 Sciatica, unspecified side: Secondary | ICD-10-CM | POA: Diagnosis not present

## 2014-03-27 DIAGNOSIS — F17200 Nicotine dependence, unspecified, uncomplicated: Secondary | ICD-10-CM | POA: Insufficient documentation

## 2014-03-27 DIAGNOSIS — M129 Arthropathy, unspecified: Secondary | ICD-10-CM | POA: Insufficient documentation

## 2014-03-27 DIAGNOSIS — F411 Generalized anxiety disorder: Secondary | ICD-10-CM | POA: Insufficient documentation

## 2014-03-27 DIAGNOSIS — Z79899 Other long term (current) drug therapy: Secondary | ICD-10-CM | POA: Insufficient documentation

## 2014-03-27 DIAGNOSIS — R000 Tachycardia, unspecified: Secondary | ICD-10-CM | POA: Diagnosis not present

## 2014-03-27 DIAGNOSIS — R112 Nausea with vomiting, unspecified: Secondary | ICD-10-CM | POA: Diagnosis not present

## 2014-03-27 DIAGNOSIS — E785 Hyperlipidemia, unspecified: Secondary | ICD-10-CM | POA: Diagnosis not present

## 2014-03-27 LAB — CBC WITH DIFFERENTIAL/PLATELET
Basophils Absolute: 0.1 10*3/uL (ref 0.0–0.1)
Basophils Relative: 1 % (ref 0–1)
Eosinophils Absolute: 0.6 10*3/uL (ref 0.0–0.7)
Eosinophils Relative: 6 % — ABNORMAL HIGH (ref 0–5)
HCT: 38.2 % (ref 36.0–46.0)
Hemoglobin: 13 g/dL (ref 12.0–15.0)
Lymphocytes Relative: 32 % (ref 12–46)
Lymphs Abs: 2.9 10*3/uL (ref 0.7–4.0)
MCH: 28.4 pg (ref 26.0–34.0)
MCHC: 34 g/dL (ref 30.0–36.0)
MCV: 83.4 fL (ref 78.0–100.0)
Monocytes Absolute: 0.5 10*3/uL (ref 0.1–1.0)
Monocytes Relative: 6 % (ref 3–12)
Neutro Abs: 4.8 10*3/uL (ref 1.7–7.7)
Neutrophils Relative %: 55 % (ref 43–77)
Platelets: 429 10*3/uL — ABNORMAL HIGH (ref 150–400)
RBC: 4.58 MIL/uL (ref 3.87–5.11)
RDW: 15.1 % (ref 11.5–15.5)
WBC: 8.9 10*3/uL (ref 4.0–10.5)

## 2014-03-27 LAB — COMPREHENSIVE METABOLIC PANEL
ALT: 14 U/L (ref 0–35)
AST: 21 U/L (ref 0–37)
Albumin: 4.2 g/dL (ref 3.5–5.2)
Alkaline Phosphatase: 139 U/L — ABNORMAL HIGH (ref 39–117)
BUN: 29 mg/dL — ABNORMAL HIGH (ref 6–23)
CO2: 20 mEq/L (ref 19–32)
Calcium: 10.5 mg/dL (ref 8.4–10.5)
Chloride: 98 mEq/L (ref 96–112)
Creatinine, Ser: 1.79 mg/dL — ABNORMAL HIGH (ref 0.50–1.10)
GFR calc Af Amer: 32 mL/min — ABNORMAL LOW (ref >90)
GFR calc non Af Amer: 28 mL/min — ABNORMAL LOW (ref >90)
Glucose, Bld: 144 mg/dL — ABNORMAL HIGH (ref 70–99)
Potassium: 5 mEq/L (ref 3.7–5.3)
Sodium: 138 mEq/L (ref 137–147)
Total Bilirubin: 0.2 mg/dL — ABNORMAL LOW (ref 0.3–1.2)
Total Protein: 8.3 g/dL (ref 6.0–8.3)

## 2014-03-27 MED ORDER — OXYCODONE-ACETAMINOPHEN 5-325 MG PO TABS
1.0000 | ORAL_TABLET | Freq: Once | ORAL | Status: AC
  Administered 2014-03-28: 1 via ORAL
  Filled 2014-03-27: qty 1

## 2014-03-27 NOTE — ED Notes (Signed)
Patient requesting something for pain.

## 2014-03-27 NOTE — ED Notes (Signed)
Pt reports leg weakness and light headed when standing, pt sts seen in past for same. Onset Sunday, also reports leg pain

## 2014-03-28 DIAGNOSIS — M5430 Sciatica, unspecified side: Secondary | ICD-10-CM | POA: Diagnosis not present

## 2014-03-28 LAB — URINALYSIS, ROUTINE W REFLEX MICROSCOPIC
Bilirubin Urine: NEGATIVE
Glucose, UA: NEGATIVE mg/dL
Hgb urine dipstick: NEGATIVE
Ketones, ur: NEGATIVE mg/dL
Leukocytes, UA: NEGATIVE
Nitrite: NEGATIVE
Protein, ur: 100 mg/dL — AB
Specific Gravity, Urine: 1.013 (ref 1.005–1.030)
Urobilinogen, UA: 1 mg/dL (ref 0.0–1.0)
pH: 6.5 (ref 5.0–8.0)

## 2014-03-28 LAB — URINE MICROSCOPIC-ADD ON

## 2014-03-28 MED ORDER — ONDANSETRON HCL 4 MG PO TABS: 4.0000 mg | ORAL_TABLET | Freq: Four times a day (QID) | ORAL | Status: DC | PRN

## 2014-03-28 MED ORDER — OXYCODONE-ACETAMINOPHEN 5-325 MG PO TABS: 1.0000 | ORAL_TABLET | ORAL | Status: DC | PRN

## 2014-03-28 NOTE — Discharge Instructions (Signed)
Take loperatmide (Imodium AD) as needed for diarrhea.  Sciatica Sciatica is pain, weakness, numbness, or tingling along the path of the sciatic nerve. The nerve starts in the lower back and runs down the back of each leg. The nerve controls the muscles in the lower leg and in the back of the knee, while also providing sensation to the back of the thigh, lower leg, and the sole of your foot. Sciatica is a symptom of another medical condition. For instance, nerve damage or certain conditions, such as a herniated disk or bone spur on the spine, pinch or put pressure on the sciatic nerve. This causes the pain, weakness, or other sensations normally associated with sciatica. Generally, sciatica only affects one side of the body. CAUSES   Herniated or slipped disc.  Degenerative disk disease.  A pain disorder involving the narrow muscle in the buttocks (piriformis syndrome).  Pelvic injury or fracture.  Pregnancy.  Tumor (rare). SYMPTOMS  Symptoms can vary from mild to very severe. The symptoms usually travel from the low back to the buttocks and down the back of the leg. Symptoms can include:  Mild tingling or dull aches in the lower back, leg, or hip.  Numbness in the back of the calf or sole of the foot.  Burning sensations in the lower back, leg, or hip.  Sharp pains in the lower back, leg, or hip.  Leg weakness.  Severe back pain inhibiting movement. These symptoms may get worse with coughing, sneezing, laughing, or prolonged sitting or standing. Also, being overweight may worsen symptoms. DIAGNOSIS  Your caregiver will perform a physical exam to look for common symptoms of sciatica. He or she may ask you to do certain movements or activities that would trigger sciatic nerve pain. Other tests may be performed to find the cause of the sciatica. These may include:  Blood tests.  X-rays.  Imaging tests, such as an MRI or CT scan. TREATMENT  Treatment is directed at the cause of  the sciatic pain. Sometimes, treatment is not necessary and the pain and discomfort goes away on its own. If treatment is needed, your caregiver may suggest:  Over-the-counter medicines to relieve pain.  Prescription medicines, such as anti-inflammatory medicine, muscle relaxants, or narcotics.  Applying heat or ice to the painful area.  Steroid injections to lessen pain, irritation, and inflammation around the nerve.  Reducing activity during periods of pain.  Exercising and stretching to strengthen your abdomen and improve flexibility of your spine. Your caregiver may suggest losing weight if the extra weight makes the back pain worse.  Physical therapy.  Surgery to eliminate what is pressing or pinching the nerve, such as a bone spur or part of a herniated disk. HOME CARE INSTRUCTIONS   Only take over-the-counter or prescription medicines for pain or discomfort as directed by your caregiver.  Apply ice to the affected area for 20 minutes, 3 4 times a day for the first 48 72 hours. Then try heat in the same way.  Exercise, stretch, or perform your usual activities if these do not aggravate your pain.  Attend physical therapy sessions as directed by your caregiver.  Keep all follow-up appointments as directed by your caregiver.  Do not wear high heels or shoes that do not provide proper support.  Check your mattress to see if it is too soft. A firm mattress may lessen your pain and discomfort. SEEK IMMEDIATE MEDICAL CARE IF:   You lose control of your bowel or bladder (incontinence).  You have increasing weakness in the lower back, pelvis, buttocks, or legs.  You have redness or swelling of your back.  You have a burning sensation when you urinate.  You have pain that gets worse when you lie down or awakens you at night.  Your pain is worse than you have experienced in the past.  Your pain is lasting longer than 4 weeks.  You are suddenly losing weight without  reason. MAKE SURE YOU:  Understand these instructions.  Will watch your condition.  Will get help right away if you are not doing well or get worse. Document Released: 09/28/2001 Document Revised: 04/04/2012 Document Reviewed: 02/13/2012 [facility] Patient Information 2014 [place].  Nausea and Vomiting Nausea is a sick feeling that often comes before throwing up (vomiting). Vomiting is a reflex where stomach contents come out of your mouth. Vomiting can cause severe loss of body fluids (dehydration). Children and elderly adults can become dehydrated quickly, especially if they also have diarrhea. Nausea and vomiting are symptoms of a condition or disease. It is important to find the cause of your symptoms. CAUSES   Direct irritation of the stomach lining. This irritation can result from increased acid production (gastroesophageal reflux disease), infection, food poisoning, taking certain medicines (such as nonsteroidal anti-inflammatory drugs), alcohol use, or tobacco use.  Signals from the brain.These signals could be caused by a headache, heat exposure, an inner ear disturbance, increased pressure in the brain from injury, infection, a tumor, or a concussion, pain, emotional stimulus, or metabolic problems.  An obstruction in the gastrointestinal tract (bowel obstruction).  Illnesses such as diabetes, hepatitis, gallbladder problems, appendicitis, kidney problems, cancer, sepsis, atypical symptoms of a heart attack, or eating disorders.  Medical treatments such as chemotherapy and radiation.  Receiving medicine that makes you sleep (general anesthetic) during surgery. DIAGNOSIS Your caregiver may ask for tests to be done if the problems do not improve after a few days. Tests may also be done if symptoms are severe or if the reason for the nausea and vomiting is not clear. Tests may include:  Urine tests.  Blood tests.  Stool tests.  Cultures (to look for evidence of  infection).  X-rays or other imaging studies. Test results can help your caregiver make decisions about treatment or the need for additional tests. TREATMENT You need to stay well hydrated. Drink frequently but in small amounts.You may wish to drink water, sports drinks, clear broth, or eat frozen ice pops or gelatin dessert to help stay hydrated.When you eat, eating slowly may help prevent nausea.There are also some antinausea medicines that may help prevent nausea. HOME CARE INSTRUCTIONS   Take all medicine as directed by your caregiver.  If you do not have an appetite, do not force yourself to eat. However, you must continue to drink fluids.  If you have an appetite, eat a normal diet unless your caregiver tells you differently.  Eat a variety of complex carbohydrates (rice, wheat, potatoes, bread), lean meats, yogurt, fruits, and vegetables.  Avoid high-fat foods because they are more difficult to digest.  Drink enough water and fluids to keep your urine clear or pale yellow.  If you are dehydrated, ask your caregiver for specific rehydration instructions. Signs of dehydration may include:  Severe thirst.  Dry lips and mouth.  Dizziness.  Dark urine.  Decreasing urine frequency and amount.  Confusion.  Rapid breathing or pulse. SEEK IMMEDIATE MEDICAL CARE IF:   You have blood or brown flecks (like coffee grounds) in  your vomit.  You have black or bloody stools.  You have a severe headache or stiff neck.  You are confused.  You have severe abdominal pain.  You have chest pain or trouble breathing.  You do not urinate at least once every 8 hours.  You develop cold or clammy skin.  You continue to vomit for longer than 24 to 48 hours.  You have a fever. MAKE SURE YOU:   Understand these instructions.  Will watch your condition.  Will get help right away if you are not doing well or get worse. Document Released: 10/04/2005 Document Revised: 12/27/2011  Document Reviewed: 03/03/2011 [facility] Patient Information 2014 [place], [place].  Diarrhea Diarrhea is frequent loose and watery bowel movements. It can cause you to feel weak and dehydrated. Dehydration can cause you to become tired and thirsty, have a dry mouth, and have decreased urination that often is dark yellow. Diarrhea is a sign of another problem, most often an infection that will not last long. In most cases, diarrhea typically lasts 2 3 days. However, it can last longer if it is a sign of something more serious. It is important to treat your diarrhea as directed by your caregive to lessen or prevent future episodes of diarrhea. CAUSES  Some common causes include:  Gastrointestinal infections caused by viruses, bacteria, or parasites.  Food poisoning or food allergies.  Certain medicines, such as antibiotics, chemotherapy, and laxatives.  Artificial sweeteners and fructose.  Digestive disorders. HOME CARE INSTRUCTIONS  Ensure adequate fluid intake (hydration): have 1 cup (8 oz) of fluid for each diarrhea episode. Avoid fluids that contain simple sugars or sports drinks, fruit juices, whole milk products, and sodas. Your urine should be clear or pale yellow if you are drinking enough fluids. Hydrate with an oral rehydration solution that you can purchase at pharmacies, retail stores, and online. You can prepare an oral rehydration solution at home by mixing the following ingredients together:    tsp table salt.   tsp baking soda.   tsp salt substitute containing potassium chloride.  1  tablespoons sugar.  1 L (34 oz) of water.  Certain foods and beverages may increase the speed at which food moves through the gastrointestinal (GI) tract. These foods and beverages should be avoided and include:  Caffeinated and alcoholic beverages.  High-fiber foods, such as raw fruits and vegetables, nuts, seeds, and whole grain breads and cereals.  Foods and beverages sweetened with  sugar alcohols, such as xylitol, sorbitol, and mannitol.  Some foods may be well tolerated and may help thicken stool including:  Starchy foods, such as rice, toast, pasta, low-sugar cereal, oatmeal, grits, baked potatoes, crackers, and bagels.  Bananas.  Applesauce.  Add probiotic-rich foods to help increase healthy bacteria in the GI tract, such as yogurt and fermented milk products.  Wash your hands well after each diarrhea episode.  Only take over-the-counter or prescription medicines as directed by your caregiver.  Take a warm bath to relieve any burning or pain from frequent diarrhea episodes. SEEK IMMEDIATE MEDICAL CARE IF:   You are unable to keep fluids down.  You have persistent vomiting.  You have blood in your stool, or your stools are black and tarry.  You do not urinate in 6 8 hours, or there is only a small amount of very dark urine.  You have abdominal pain that increases or localizes.  You have weakness, dizziness, confusion, or lightheadedness.  You have a severe headache.  Your diarrhea gets worse  or does not get better.  You have a fever or persistent symptoms for more than 2 3 days.  You have a fever and your symptoms suddenly get worse. MAKE SURE YOU:   Understand these instructions.  Will watch your condition.  Will get help right away if you are not doing well or get worse. Document Released: 09/24/2002 Document Revised: 09/20/2012 Document Reviewed: 06/11/2012 [facility] Patient Information 2014 [place], [place].  Acetaminophen; Oxycodone tablets What is this medicine? ACETAMINOPHEN; OXYCODONE (a set a MEE noe fen; ox i KOE done) is a pain reliever. It is used to treat mild to moderate pain. This medicine may be used for other purposes; ask your health care provider or pharmacist if you have questions. COMMON BRAND NAME(S): Endocet, Magnacet, Narvox, Percocet, Perloxx, Primalev, Primlev, Roxicet, Xolox What should I tell my health care  provider before I take this medicine? They need to know if you have any of these conditions: -brain tumor -Crohn's disease, inflammatory bowel disease, or ulcerative colitis -drug abuse or addiction -head injury -heart or circulation problems -if you often drink alcohol -kidney disease or problems going to the bathroom -liver disease -lung disease, asthma, or breathing problems -an unusual or allergic reaction to acetaminophen, oxycodone, other opioid analgesics, other medicines, foods, dyes, or preservatives -pregnant or trying to get pregnant -breast-feeding How should I use this medicine? Take this medicine by mouth with a full glass of water. Follow the directions on the prescription label. Take your medicine at regular intervals. Do not take your medicine more often than directed. Talk to your pediatrician regarding the use of this medicine in children. Special care may be needed. Patients over 30 years old may have a stronger reaction and need a smaller dose. Overdosage: If you think you have taken too much of this medicine contact a poison control center or emergency room at once. NOTE: This medicine is only for you. Do not share this medicine with others. What if I miss a dose? If you miss a dose, take it as soon as you can. If it is almost time for your next dose, take only that dose. Do not take double or extra doses. What may interact with this medicine? -alcohol -antihistamines -barbiturates like amobarbital, butalbital, butabarbital, methohexital, pentobarbital, phenobarbital, thiopental, and secobarbital -benztropine -drugs for bladder problems like solifenacin, trospium, oxybutynin, tolterodine, hyoscyamine, and methscopolamine -drugs for breathing problems like ipratropium and tiotropium -drugs for certain stomach or intestine problems like propantheline, homatropine methylbromide, glycopyrrolate, atropine, belladonna, and dicyclomine -general anesthetics like etomidate,  ketamine, nitrous oxide, propofol, desflurane, enflurane, halothane, isoflurane, and sevoflurane -medicines for depression, anxiety, or psychotic disturbances -medicines for sleep -muscle relaxants -naltrexone -narcotic medicines (opiates) for pain -phenothiazines like perphenazine, thioridazine, chlorpromazine, mesoridazine, fluphenazine, prochlorperazine, promazine, and trifluoperazine -scopolamine -tramadol -trihexyphenidyl This list may not describe all possible interactions. Give your health care provider a list of all the medicines, herbs, non-prescription drugs, or dietary supplements you use. Also tell them if you smoke, drink alcohol, or use illegal drugs. Some items may interact with your medicine. What should I watch for while using this medicine? Tell your doctor or health care professional if your pain does not go away, if it gets worse, or if you have new or a different type of pain. You may develop tolerance to the medicine. Tolerance means that you will need a higher dose of the medication for pain relief. Tolerance is normal and is expected if you take this medicine for a long time. Do not suddenly stop  taking your medicine because you may develop a severe reaction. Your body becomes used to the medicine. This does NOT mean you are addicted. Addiction is a behavior related to getting and using a drug for a non-medical reason. If you have pain, you have a medical reason to take pain medicine. Your doctor will tell you how much medicine to take. If your doctor wants you to stop the medicine, the dose will be slowly lowered over time to avoid any side effects. You may get drowsy or dizzy. Do not drive, use machinery, or do anything that needs mental alertness until you know how this medicine affects you. Do not stand or sit up quickly, especially if you are an older patient. This reduces the risk of dizzy or fainting spells. Alcohol may interfere with the effect of this medicine. Avoid  alcoholic drinks. There are different types of narcotic medicines (opiates) for pain. If you take more than one type at the same time, you may have more side effects. Give your health care provider a list of all medicines you use. Your doctor will tell you how much medicine to take. Do not take more medicine than directed. Call emergency for help if you have problems breathing. The medicine will cause constipation. Try to have a bowel movement at least every 2 to 3 days. If you do not have a bowel movement for 3 days, call your doctor or health care professional. Do not take Tylenol (acetaminophen) or medicines that have acetaminophen with this medicine. Too much acetaminophen can be very dangerous. Many nonprescription medicines contain acetaminophen. Always read the labels carefully to avoid taking more acetaminophen. What side effects may I notice from receiving this medicine? Side effects that you should report to your doctor or health care professional as soon as possible: -allergic reactions like skin rash, itching or hives, swelling of the face, lips, or tongue -breathing difficulties, wheezing -confusion -light headedness or fainting spells -severe stomach pain -unusually weak or tired -yellowing of the skin or the whites of the eyes  Side effects that usually do not require medical attention (report to your doctor or health care professional if they continue or are bothersome): -dizziness -drowsiness -nausea -vomiting This list may not describe all possible side effects. Call your doctor for medical advice about side effects. You may report side effects to FDA at 1-800-FDA-1088. Where should I keep my medicine? Keep out of the reach of children. This medicine can be abused. Keep your medicine in a safe place to protect it from theft. Do not share this medicine with anyone. Selling or giving away this medicine is dangerous and against the law. Store at room temperature between 20 and 25  degrees C (68 and 77 degrees F). Keep container tightly closed. Protect from light. This medicine may cause accidental overdose and death if it is taken by other adults, children, or pets. Flush any unused medicine down the toilet to reduce the chance of harm. Do not use the medicine after the expiration date. NOTE: This sheet is a summary. It may not cover all possible information. If you have questions about this medicine, talk to your doctor, pharmacist, or health care provider.  2014, [employer]. (2013-05-28 13:17:35)  Ondansetron tablets What is this medicine? ONDANSETRON (on DAN se tron) is used to treat nausea and vomiting caused by chemotherapy. It is also used to prevent or treat nausea and vomiting after surgery. This medicine may be used for other purposes; ask your health care provider  or pharmacist if you have questions. COMMON BRAND NAME(S): Zofran What should I tell my health care provider before I take this medicine? They need to know if you have any of these conditions: -heart disease -history of irregular heartbeat -liver disease -low levels of magnesium or potassium in the blood -an unusual or allergic reaction to ondansetron, granisetron, other medicines, foods, dyes, or preservatives -pregnant or trying to get pregnant -breast-feeding How should I use this medicine? Take this medicine by mouth with a glass of water. Follow the directions on your prescription label. Take your doses at regular intervals. Do not take your medicine more often than directed. Talk to your pediatrician regarding the use of this medicine in children. Special care may be needed. Overdosage: If you think you have taken too much of this medicine contact a poison control center or emergency room at once. NOTE: This medicine is only for you. Do not share this medicine with others. What if I miss a dose? If you miss a dose, take it as soon as you can. If it is almost time for your next  dose, take only that dose. Do not take double or extra doses. What may interact with this medicine? Do not take this medicine with any of the following medications: -apomorphine -certain medicines for fungal infections like fluconazole, itraconazole, ketoconazole, posaconazole, voriconazole -cisapride -dofetilide -dronedarone -pimozide -thioridazine -ziprasidone  This medicine may also interact with the following medications: -carbamazepine -certain medicines for depression, anxiety, or psychotic disturbances -fentanyl -linezolid -MAOIs like Carbex, Eldepryl, Marplan, Nardil, and Parnate -methylene blue (injected into a vein) -other medicines that prolong the QT interval (cause an abnormal heart rhythm) -phenytoin -rifampicin -tramadol This list may not describe all possible interactions. Give your health care provider a list of all the medicines, herbs, non-prescription drugs, or dietary supplements you use. Also tell them if you smoke, drink alcohol, or use illegal drugs. Some items may interact with your medicine. What should I watch for while using this medicine? Check with your doctor or health care professional right away if you have any sign of an allergic reaction. What side effects may I notice from receiving this medicine? Side effects that you should report to your doctor or health care professional as soon as possible: -allergic reactions like skin rash, itching or hives, swelling of the face, lips or tongue -breathing problems -confusion -dizziness -fast or irregular heartbeat -feeling faint or lightheaded, falls -fever and chills -loss of balance or coordination -seizures -sweating -swelling of the hands or feet -tightness in the chest -tremors -unusually weak or tired Side effects that usually do not require medical attention (report to your doctor or health care professional if they continue or are bothersome): -constipation or diarrhea -headache This list  may not describe all possible side effects. Call your doctor for medical advice about side effects. You may report side effects to FDA at 1-800-FDA-1088. Where should I keep my medicine? Keep out of the reach of children. Store between 2 and 30 degrees C (36 and 86 degrees F). Throw away any unused medicine after the expiration date. NOTE: This sheet is a summary. It may not cover all possible information. If you have questions about this medicine, talk to your doctor, pharmacist, or health care provider.  2014, [employer]. (2013-07-11 16:27:45)

## 2014-03-28 NOTE — ED Provider Notes (Signed)
CSN: [id number]     Arrival date & time 03/27/14  1739 History   First MD Initiated Contact with Patient 03/28/14 0059     Chief Complaint  Patient presents with  . Leg Pain  . Weakness  . Gait Problem     (Consider location/radiation/quality/duration/timing/severity/associated sxs/prior Treatment) Patient is a 73 y.o. female presenting with leg pain and weakness. The history is provided by the patient.  Leg Pain Weakness  She complains of pain in both of her legs. Relief in her left leg starts at around the left posterior iliac crest and goes all the way down the leg. The pain really starts in the popliteal fossa and extends down the leg from the air. Pain is gone but moderately severe. She rates it at 8/10. It is worse with ambulating. Her legs will sometimes give out on her. She denies any bowel or bladder dysfunction. However, as a separate issue, she has had vomiting and diarrhea for the last 2 days. Symptoms seem to be resolving and she has tolerated foods since arriving in the ED. She denies fever, chills, sweats. There's been no recent trauma. She was seen in the ED 2 months ago and diagnosed with sciatica and referred to neurosurgery but has been unable to get in she has not been able to get a referral from her PCP. She has seen her PCP who prescribed Percocet which has given temporary relief of pain.  Past Medical History  Diagnosis Date  . Hyperlipidemia   . Hypertension   . Leg pain   . Joint pain   . Headache(784.0)   . Anxiety   . Arthritis    Past Surgical History  Procedure Laterality Date  . Total hip arthroplasty      right  . 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 Heart attack Mother   . Heart attack Father   . Cancer Sister   . Aneurysm Brother   . Cancer Brother    History  Substance Use Topics  . Smoking status: Current Every Day Smoker -- 0.50 packs/day    Types: Cigarettes  . Smokeless tobacco:  Not on file  . Alcohol Use: No   OB History   Grav Para Term Preterm Abortions TAB SAB Ect Mult Living                 Review of Systems  Neurological: Positive for weakness.  All other systems reviewed and are negative.     Allergies  Review of patient's allergies indicates no known allergies.  Home Medications   Prior to Admission medications   Medication Sig Start Date End Date Taking? Authorizing Provider  Amlodipine-Valsartan-HCTZ 10-320-25 MG TABS Take 1 tablet by mouth daily.   Yes Historical Provider, MD  aspirin-acetaminophen-caffeine (EXCEDRIN MIGRAINE) [phone] MG per tablet Take 1 tablet by mouth every 6 (six) hours as needed for headache.   Yes Historical Provider, MD  busPIRone (BUSPAR) 15 MG tablet Take 15 mg by mouth 2 (two) times daily as needed. For anxiety.   Yes Historical Provider, MD  diphenhydrAMINE (BENADRYL) 25 MG tablet Take 25 mg by mouth every 6 (six) hours as needed for allergies. 06/21/13  Yes Kalman Drape, MD  meloxicam (MOBIC) 7.5 MG tablet Take 7.5 mg by mouth daily as needed for pain.   Yes Historical Provider, MD  Multiple Vitamins-Calcium (ONE-A-DAY WOMENS PO) Take 1 tablet by mouth daily.   Yes Historical Provider, MD  rosuvastatin (CRESTOR) 10 MG tablet Take 10 mg by mouth daily.     Yes Historical Provider, MD  SUMAtriptan (IMITREX) 100 MG tablet Take 100 mg by mouth daily as needed for migraine.    Yes Historical Provider, MD   BP 140/95  Pulse 89  Temp(Src) 98.5 F (36.9 C) (Oral)  Resp 21  Ht 5\' 2"  (1.575 m)  Wt 111 lb (50.349 kg)  BMI 20.30 kg/m2  SpO2 99% Physical Exam  Nursing note and vitals reviewed.  73 year old female, resting comfortably and in no acute distress. Vital signs are significant for hypertension with blood pressure 152/91, and tachycardia with heart rate 110. Oxygen saturation is 96%, which is normal. Head is normocephalic and atraumatic. PERRLA, EOMI. Oropharynx is clear. Neck is nontender and supple without  adenopathy or JVD. Back is nontender and there is no CVA tenderness. There is positive straight leg raise bilaterally at 30. Lungs are clear without rales, wheezes, or rhonchi. Chest is nontender. Heart has regular rate and rhythm without murmur. Abdomen is soft, flat, nontender without masses or hepatosplenomegaly and peristalsis is normoactive. Extremities have no cyanosis or edema, full range of motion is present. Skin is warm and dry without rash. Neurologic: Mental status is normal, cranial nerves are intact, there are no motor or sensory deficits.  ED Course  Procedures (including critical care time) Labs Review Results for orders placed during the hospital encounter of 03/27/14  COMPREHENSIVE METABOLIC PANEL      Result Value Ref Range   Sodium 138  137 - 147 mEq/L   Potassium 5.0  3.7 - 5.3 mEq/L   Chloride 98  96 - 112 mEq/L   CO2 20  19 - 32 mEq/L   Glucose, Bld 144 (*) 70 - 99 mg/dL   BUN 29 (*) 6 - 23 mg/dL   Creatinine, Ser 1.79 (*) 0.50 - 1.10 mg/dL   Calcium 10.5  8.4 - 10.5 mg/dL   Total Protein 8.3  6.0 - 8.3 g/dL   Albumin 4.2  3.5 - 5.2 g/dL   AST 21  0 - 37 U/L   ALT 14  0 - 35 U/L   Alkaline Phosphatase 139 (*) 39 - 117 U/L   Total Bilirubin 0.2 (*) 0.3 - 1.2 mg/dL   GFR calc non Af Amer 28 (*) >90 mL/min   GFR calc Af Amer 32 (*) >90 mL/min  CBC WITH DIFFERENTIAL      Result Value Ref Range   WBC 8.9  4.0 - 10.5 K/uL   RBC 4.58  3.87 - 5.11 MIL/uL   Hemoglobin 13.0  12.0 - 15.0 g/dL   HCT 38.2  36.0 - 46.0 %   MCV 83.4  78.0 - 100.0 fL   MCH 28.4  26.0 - 34.0 pg   MCHC 34.0  30.0 - 36.0 g/dL   RDW 15.1  11.5 - 15.5 %   Platelets 429 (*) 150 - 400 K/uL   Neutrophils Relative % 55  43 - 77 %   Neutro Abs 4.8  1.7 - 7.7 K/uL   Lymphocytes Relative 32  12 - 46 %   Lymphs Abs 2.9  0.7 - 4.0 K/uL   Monocytes Relative 6  3 - 12 %   Monocytes Absolute 0.5  0.1 - 1.0 K/uL   Eosinophils Relative 6 (*) 0 - 5 %   Eosinophils Absolute 0.6  0.0 - 0.7 K/uL    Basophils Relative 1  0 - 1 %  Basophils Absolute 0.1  0.0 - 0.1 K/uL  URINALYSIS, ROUTINE W REFLEX MICROSCOPIC      Result Value Ref Range   Color, Urine YELLOW  YELLOW   APPearance CLOUDY (*) CLEAR   Specific Gravity, Urine 1.013  1.005 - 1.030   pH 6.5  5.0 - 8.0   Glucose, UA NEGATIVE  NEGATIVE mg/dL   Hgb urine dipstick NEGATIVE  NEGATIVE   Bilirubin Urine NEGATIVE  NEGATIVE   Ketones, ur NEGATIVE  NEGATIVE mg/dL   Protein, ur 100 (*) NEGATIVE mg/dL   Urobilinogen, UA 1.0  0.0 - 1.0 mg/dL   Nitrite NEGATIVE  NEGATIVE   Leukocytes, UA NEGATIVE  NEGATIVE  URINE MICROSCOPIC-ADD ON      Result Value Ref Range   Squamous Epithelial / LPF MANY (*) RARE   Bacteria, UA RARE  RARE   Casts HYALINE CASTS (*) NEGATIVE   No results found.    MDM   Final diagnoses:  Sciatica  Nausea vomiting and diarrhea  Renal insufficiency    Sciatica. She has renal insufficiency so could not be given NSAIDs. She has received adequate relief of pain with oxycodone-acetaminophen and will need to continue on that. She is referred back to neurosurgery. Patient is advised that she will likely need MRI scan to get a definitive diagnosis. For her nausea, she is given a prescription for ondansetron and she is told to take over-the-counter loperamide as needed for diarrhea.    Delora Fuel, MD [id number]

## 2014-05-14 ENCOUNTER — Encounter (HOSPITAL_COMMUNITY): Payer: Self-pay | Admitting: Emergency Medicine

## 2014-05-14 ENCOUNTER — Emergency Department (HOSPITAL_COMMUNITY)
Admission: EM | Admit: 2014-05-14 | Discharge: 2014-05-14 | Disposition: A | Discharge status: Home / Self Care | Payer: Medicare Other | Attending: Emergency Medicine | Admitting: Emergency Medicine

## 2014-05-14 DIAGNOSIS — M79609 Pain in unspecified limb: Secondary | ICD-10-CM | POA: Insufficient documentation

## 2014-05-14 DIAGNOSIS — F411 Generalized anxiety disorder: Secondary | ICD-10-CM | POA: Insufficient documentation

## 2014-05-14 DIAGNOSIS — G8929 Other chronic pain: Secondary | ICD-10-CM | POA: Insufficient documentation

## 2014-05-14 DIAGNOSIS — F17200 Nicotine dependence, unspecified, uncomplicated: Secondary | ICD-10-CM | POA: Insufficient documentation

## 2014-05-14 DIAGNOSIS — I10 Essential (primary) hypertension: Secondary | ICD-10-CM | POA: Insufficient documentation

## 2014-05-14 DIAGNOSIS — M545 Low back pain, unspecified: Secondary | ICD-10-CM | POA: Diagnosis present

## 2014-05-14 DIAGNOSIS — M129 Arthropathy, unspecified: Secondary | ICD-10-CM | POA: Diagnosis not present

## 2014-05-14 DIAGNOSIS — M5432 Sciatica, left side: Secondary | ICD-10-CM

## 2014-05-14 DIAGNOSIS — E785 Hyperlipidemia, unspecified: Secondary | ICD-10-CM | POA: Insufficient documentation

## 2014-05-14 DIAGNOSIS — Z79899 Other long term (current) drug therapy: Secondary | ICD-10-CM | POA: Diagnosis not present

## 2014-05-14 DIAGNOSIS — M5430 Sciatica, unspecified side: Secondary | ICD-10-CM | POA: Diagnosis not present

## 2014-05-14 MED ORDER — CYCLOBENZAPRINE HCL 10 MG PO TABS
5.0000 mg | ORAL_TABLET | Freq: Once | ORAL | Status: AC
  Administered 2014-05-14: 5 mg via ORAL
  Filled 2014-05-14: qty 1

## 2014-05-14 MED ORDER — OXYCODONE-ACETAMINOPHEN 10-325 MG PO TABS: 1.0000 | ORAL_TABLET | Freq: Four times a day (QID) | ORAL | Status: DC | PRN

## 2014-05-14 MED ORDER — HYDROMORPHONE HCL PF 1 MG/ML IJ SOLN
2.0000 mg | Freq: Once | INTRAMUSCULAR | Status: AC
  Administered 2014-05-14: 2 mg via INTRAMUSCULAR
  Filled 2014-05-14: qty 2

## 2014-05-14 NOTE — ED Notes (Signed)
Patient given coffee. 

## 2014-05-14 NOTE — Discharge Instructions (Signed)
Sciatica Sciatica is pain, weakness, numbness, or tingling along the path of the sciatic nerve. The nerve starts in the lower back and runs down the back of each leg. The nerve controls the muscles in the lower leg and in the back of the knee, while also providing sensation to the back of the thigh, lower leg, and the sole of your foot. Sciatica is a symptom of another medical condition. For instance, nerve damage or certain conditions, such as a herniated disk or bone spur on the spine, pinch or put pressure on the sciatic nerve. This causes the pain, weakness, or other sensations normally associated with sciatica. Generally, sciatica only affects one side of the body. CAUSES   Herniated or slipped disc.  Degenerative disk disease.  A pain disorder involving the narrow muscle in the buttocks (piriformis syndrome).  Pelvic injury or fracture.  Pregnancy.  Tumor (rare). SYMPTOMS  Symptoms can vary from mild to very severe. The symptoms usually travel from the low back to the buttocks and down the back of the leg. Symptoms can include:  Mild tingling or dull aches in the lower back, leg, or hip.  Numbness in the back of the calf or sole of the foot.  Burning sensations in the lower back, leg, or hip.  Sharp pains in the lower back, leg, or hip.  Leg weakness.  Severe back pain inhibiting movement. These symptoms may get worse with coughing, sneezing, laughing, or prolonged sitting or standing. Also, being overweight may worsen symptoms. DIAGNOSIS  Your caregiver will perform a physical exam to look for common symptoms of sciatica. He or she may ask you to do certain movements or activities that would trigger sciatic nerve pain. Other tests may be performed to find the cause of the sciatica. These may include:  Blood tests.  X-rays.  Imaging tests, such as an MRI or CT scan. TREATMENT  Treatment is directed at the cause of the sciatic pain. Sometimes, treatment is not necessary  and the pain and discomfort goes away on its own. If treatment is needed, your caregiver may suggest:  Over-the-counter medicines to relieve pain.  Prescription medicines, such as anti-inflammatory medicine, muscle relaxants, or narcotics.  Applying heat or ice to the painful area.  Steroid injections to lessen pain, irritation, and inflammation around the nerve.  Reducing activity during periods of pain.  Exercising and stretching to strengthen your abdomen and improve flexibility of your spine. Your caregiver may suggest losing weight if the extra weight makes the back pain worse.  Physical therapy.  Surgery to eliminate what is pressing or pinching the nerve, such as a bone spur or part of a herniated disk. HOME CARE INSTRUCTIONS   Only take over-the-counter or prescription medicines for pain or discomfort as directed by your caregiver.  Apply ice to the affected area for 20 minutes, 3-4 times a day for the first 48-72 hours. Then try heat in the same way.  Exercise, stretch, or perform your usual activities if these do not aggravate your pain.  Attend physical therapy sessions as directed by your caregiver.  Keep all follow-up appointments as directed by your caregiver.  Do not wear high heels or shoes that do not provide proper support.  Check your mattress to see if it is too soft. A firm mattress may lessen your pain and discomfort. SEEK IMMEDIATE MEDICAL CARE IF:   You lose control of your bowel or bladder (incontinence).  You have increasing weakness in the lower back, pelvis, buttocks,   or legs.  You have redness or swelling of your back.  You have a burning sensation when you urinate.  You have pain that gets worse when you lie down or awakens you at night.  Your pain is worse than you have experienced in the past.  Your pain is lasting longer than 4 weeks.  You are suddenly losing weight without reason. MAKE SURE YOU:  Understand these  instructions.  Will watch your condition.  Will get help right away if you are not doing well or get worse. Document Released: 09/28/2001 Document Revised: 04/04/2012 Document Reviewed: 02/13/2012 [employer] Patient Information 2015 [employer]. This information is not intended to replace advice given to you by your health care provider. Make sure you discuss any questions you have with your health care provider.  

## 2014-05-14 NOTE — ED Notes (Signed)
Pt st's she is scheduled to have procedure on her back 8/13 but pain medication that she is taking at home is not helping the pain

## 2014-05-14 NOTE — ED Notes (Signed)
Pt ambulated with straight cane and no  Assistance.  Pt ambulated 60 feet. Pt states pain level is 0.

## 2014-05-14 NOTE — ED Notes (Signed)
Pt has a gap in her spine and is scheduled to have interventions, but continues to have pain to lower back, hip and bilateral leg pain that has increased.  DPP bilateral palpable and present

## 2014-05-14 NOTE — ED Provider Notes (Signed)
CSN: [id number]     Arrival date & time 05/14/14  1533 History   First MD Initiated Contact with Patient 05/14/14 1913     Chief Complaint  Patient presents with  . Back Pain  . Leg Pain  . Hip Pain     (Consider location/radiation/quality/duration/timing/severity/associated sxs/prior Treatment) Patient is a 73 y.o. female presenting with back pain. The history is provided by the patient. No language interpreter was used.  Back Pain Location:  Lumbar spine, sacro-iliac joint and gluteal region Quality:  Aching Radiates to:  L posterior upper leg Pain severity:  Severe Pain is:  Same all the time Onset quality:  Gradual Duration:  4 months Timing:  Constant Progression:  Worsening Chronicity:  Chronic Relieved by:  Lying down and narcotics Worsened by:  Movement, standing and stress Ineffective treatments:  Narcotics Associated symptoms: leg pain   Associated symptoms: no abdominal pain, no chest pain, no dysuria, no fever, no headaches, no numbness, no paresthesias, no pelvic pain, no weakness and no weight loss     Past Medical History  Diagnosis Date  . Hyperlipidemia   . Hypertension   . Leg pain   . Joint pain   . Headache(784.0)   . Anxiety   . Arthritis    Past Surgical History  Procedure Laterality Date  . Total hip arthroplasty      right  . 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 Heart attack Mother   . Heart attack Father   . Cancer Sister   . Aneurysm Brother   . Cancer Brother    History  Substance Use Topics  . Smoking status: Current Every Day Smoker -- 0.50 packs/day    Types: Cigarettes  . Smokeless tobacco: Not on file  . Alcohol Use: No   OB History   Grav Para Term Preterm Abortions TAB SAB Ect Mult Living                 Review of Systems  Constitutional: Negative for fever, chills, weight loss, diaphoresis, activity change, appetite change and fatigue.  HENT: Negative for  congestion, facial swelling, rhinorrhea and sore throat.   Eyes: Negative for photophobia and discharge.  Respiratory: Negative for cough, chest tightness and shortness of breath.   Cardiovascular: Negative for chest pain, palpitations and leg swelling.  Gastrointestinal: Negative for nausea, vomiting, abdominal pain and diarrhea.  Endocrine: Negative for polydipsia and polyuria.  Genitourinary: Negative for dysuria, frequency, difficulty urinating and pelvic pain.  Musculoskeletal: Positive for back pain. Negative for arthralgias, neck pain and neck stiffness.  Skin: Negative for color change and wound.  Allergic/Immunologic: Negative for immunocompromised state.  Neurological: Negative for facial asymmetry, weakness, numbness, headaches and paresthesias.  Hematological: Does not bruise/bleed easily.  Psychiatric/Behavioral: Negative for confusion and agitation.      Allergies  Review of patient's allergies indicates no known allergies.  Home Medications   Prior to Admission medications   Medication Sig Start Date End Date Taking? Authorizing Provider  Amlodipine-Valsartan-HCTZ 10-320-25 MG TABS Take 1 tablet by mouth daily.   Yes Historical Provider, MD  aspirin-acetaminophen-caffeine (EXCEDRIN MIGRAINE) [phone] MG per tablet Take 1 tablet by mouth every 6 (six) hours as needed for headache.   Yes Historical Provider, MD  busPIRone (BUSPAR) 15 MG tablet Take 15 mg by mouth 2 (two) times daily as needed. For anxiety.   Yes Historical Provider, MD  diphenhydrAMINE (BENADRYL) 25 MG tablet Take 25  mg by mouth every 6 (six) hours as needed for allergies. 06/21/13  Yes Kalman Drape, MD  meloxicam (MOBIC) 7.5 MG tablet Take 7.5 mg by mouth daily as needed for pain.   Yes Historical Provider, MD  Multiple Vitamins-Calcium (ONE-A-DAY WOMENS PO) Take 1 tablet by mouth daily.   Yes Historical Provider, MD  oxyCODONE-acetaminophen (PERCOCET) 5-325 MG per tablet Take 1 tablet by mouth every 4  (four) hours as needed for moderate pain. [id number]  Yes Delora Fuel, MD  rosuvastatin (CRESTOR) 10 MG tablet Take 10 mg by mouth every morning.    Yes Historical Provider, MD  oxyCODONE-acetaminophen (PERCOCET) 10-325 MG per tablet Take 1 tablet by mouth every 6 (six) hours as needed for pain. 05/14/14   Neta Ehlers, MD  SUMAtriptan (IMITREX) 100 MG tablet Take 100 mg by mouth daily as needed for migraine.     Historical Provider, MD   BP 142/81  Pulse 80  Temp(Src) 98.1 F (36.7 C) (Oral)  Resp 14  Ht 5\' 2"  (1.575 m)  Wt 104 lb (47.174 kg)  BMI 19.02 kg/m2  SpO2 96% Physical Exam  Constitutional: She is oriented to person, place, and time. She appears well-developed and well-nourished. No distress.  HENT:  Head: Normocephalic and atraumatic.  Mouth/Throat: No oropharyngeal exudate.  Eyes: Pupils are equal, round, and reactive to light.  Neck: Normal range of motion. Neck supple.  Cardiovascular: Normal rate, regular rhythm and normal heart sounds.  Exam reveals no gallop and no friction rub.   No murmur heard. Pulmonary/Chest: Effort normal and breath sounds normal. No respiratory distress. She has no wheezes. She has no rales.  Abdominal: Soft. Bowel sounds are normal. She exhibits no distension and no mass. There is no tenderness. There is no rebound and no guarding.  Musculoskeletal: Normal range of motion. She exhibits no edema and no tenderness.       Back:  Positive L straight leg raise  Neurological: She is alert and oriented to person, place, and time. She has normal strength. She displays no tremor. No cranial nerve deficit or sensory deficit. She exhibits normal muscle tone. She displays a negative Romberg sign. Coordination normal. GCS eye subscore is 4. GCS verbal subscore is 5. GCS motor subscore is 6.  Skin: Skin is warm and dry.  Psychiatric: She has a normal mood and affect.    ED Course  Procedures (including critical care time) Labs Review Labs Reviewed - No  data to display  Imaging Review No results found.   EKG Interpretation None      MDM   Final diagnoses:  Sciatica, left    Pt is a 73 y.o. female with Pmhx as above who presents with continued chronic low back pain/sciatica.  She has seen neurology, is scheduled for injections(?) in about 2 weeks, but pain has continued to worsen. No numbness, weakness, fever, bowel or bladder incontinence. No focal neuro finding on PE. Will treat symptomatically and ask her to f/u with her neurologist as scheduled.  Doubt cord compression/cauda equina, spinal epidural abscess. Return precautions given for new or worsening symptoms including worsening pain, fever, focal neuro symptoms.         Neta Ehlers, MD 05/15/14 1141

## 2014-06-06 ENCOUNTER — Encounter (HOSPITAL_COMMUNITY): Payer: Medicare Other

## 2014-06-06 ENCOUNTER — Ambulatory Visit: Payer: Medicare Other | Admitting: Family

## 2014-06-26 ENCOUNTER — Encounter: Payer: Self-pay | Admitting: Family

## 2014-06-27 ENCOUNTER — Ambulatory Visit: Payer: Medicare Other | Admitting: Family

## 2014-06-27 ENCOUNTER — Encounter (HOSPITAL_COMMUNITY): Payer: Medicare Other

## 2014-07-26 ENCOUNTER — Encounter: Payer: Self-pay | Admitting: Family

## 2014-07-29 ENCOUNTER — Ambulatory Visit (HOSPITAL_COMMUNITY)
Admission: RE | Admit: 2014-07-29 | Discharge: 2014-07-29 | Disposition: A | Discharge status: Home / Self Care | Payer: Medicare Other | Source: Ambulatory Visit | Attending: Family | Admitting: Family

## 2014-07-29 ENCOUNTER — Encounter: Payer: Self-pay | Admitting: Family

## 2014-07-29 ENCOUNTER — Ambulatory Visit (INDEPENDENT_AMBULATORY_CARE_PROVIDER_SITE_OTHER): Payer: Medicare Other | Admitting: Family

## 2014-07-29 VITALS — BP 163/92 | HR 81 | Resp 16 | Ht 63.0 in | Wt 109.0 lb

## 2014-07-29 DIAGNOSIS — I739 Peripheral vascular disease, unspecified: Secondary | ICD-10-CM

## 2014-07-29 DIAGNOSIS — M7989 Other specified soft tissue disorders: Secondary | ICD-10-CM

## 2014-07-29 DIAGNOSIS — E785 Hyperlipidemia, unspecified: Secondary | ICD-10-CM | POA: Diagnosis not present

## 2014-07-29 DIAGNOSIS — R200 Anesthesia of skin: Secondary | ICD-10-CM

## 2014-07-29 DIAGNOSIS — F17210 Nicotine dependence, cigarettes, uncomplicated: Secondary | ICD-10-CM | POA: Diagnosis not present

## 2014-07-29 DIAGNOSIS — Z48812 Encounter for surgical aftercare following surgery on the circulatory system: Secondary | ICD-10-CM | POA: Diagnosis not present

## 2014-07-29 DIAGNOSIS — I10 Essential (primary) hypertension: Secondary | ICD-10-CM | POA: Diagnosis not present

## 2014-07-29 DIAGNOSIS — R531 Weakness: Secondary | ICD-10-CM

## 2014-07-29 IMAGING — CR DG CHEST 2V
2 series · 2 of 2 positions shown · non-contrast
Comparison: Acute abdominal series 09/23/2012

CLINICAL DATA: Shortness of breath

CHEST - 2 VIEW

[w chest pa]
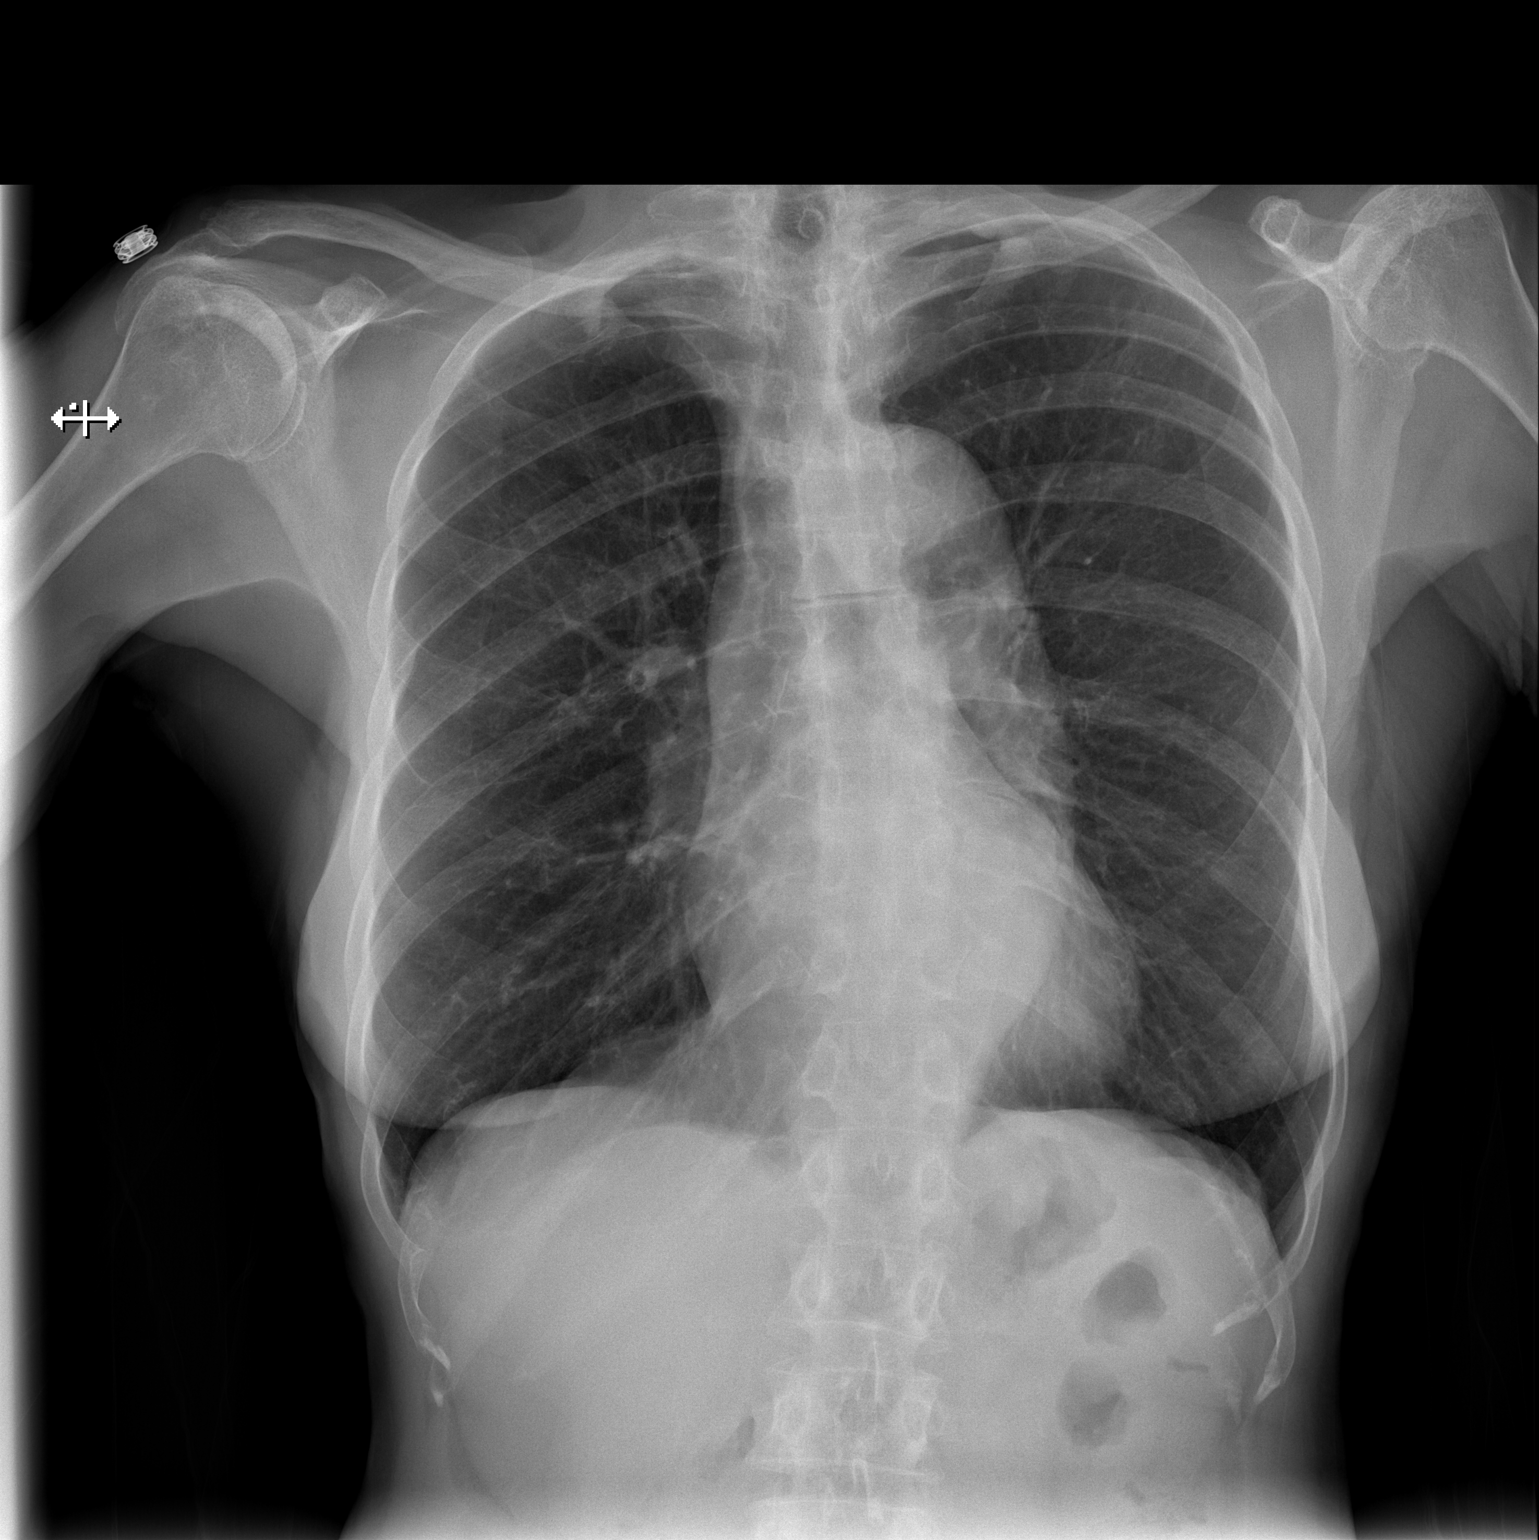

[w chest lat]
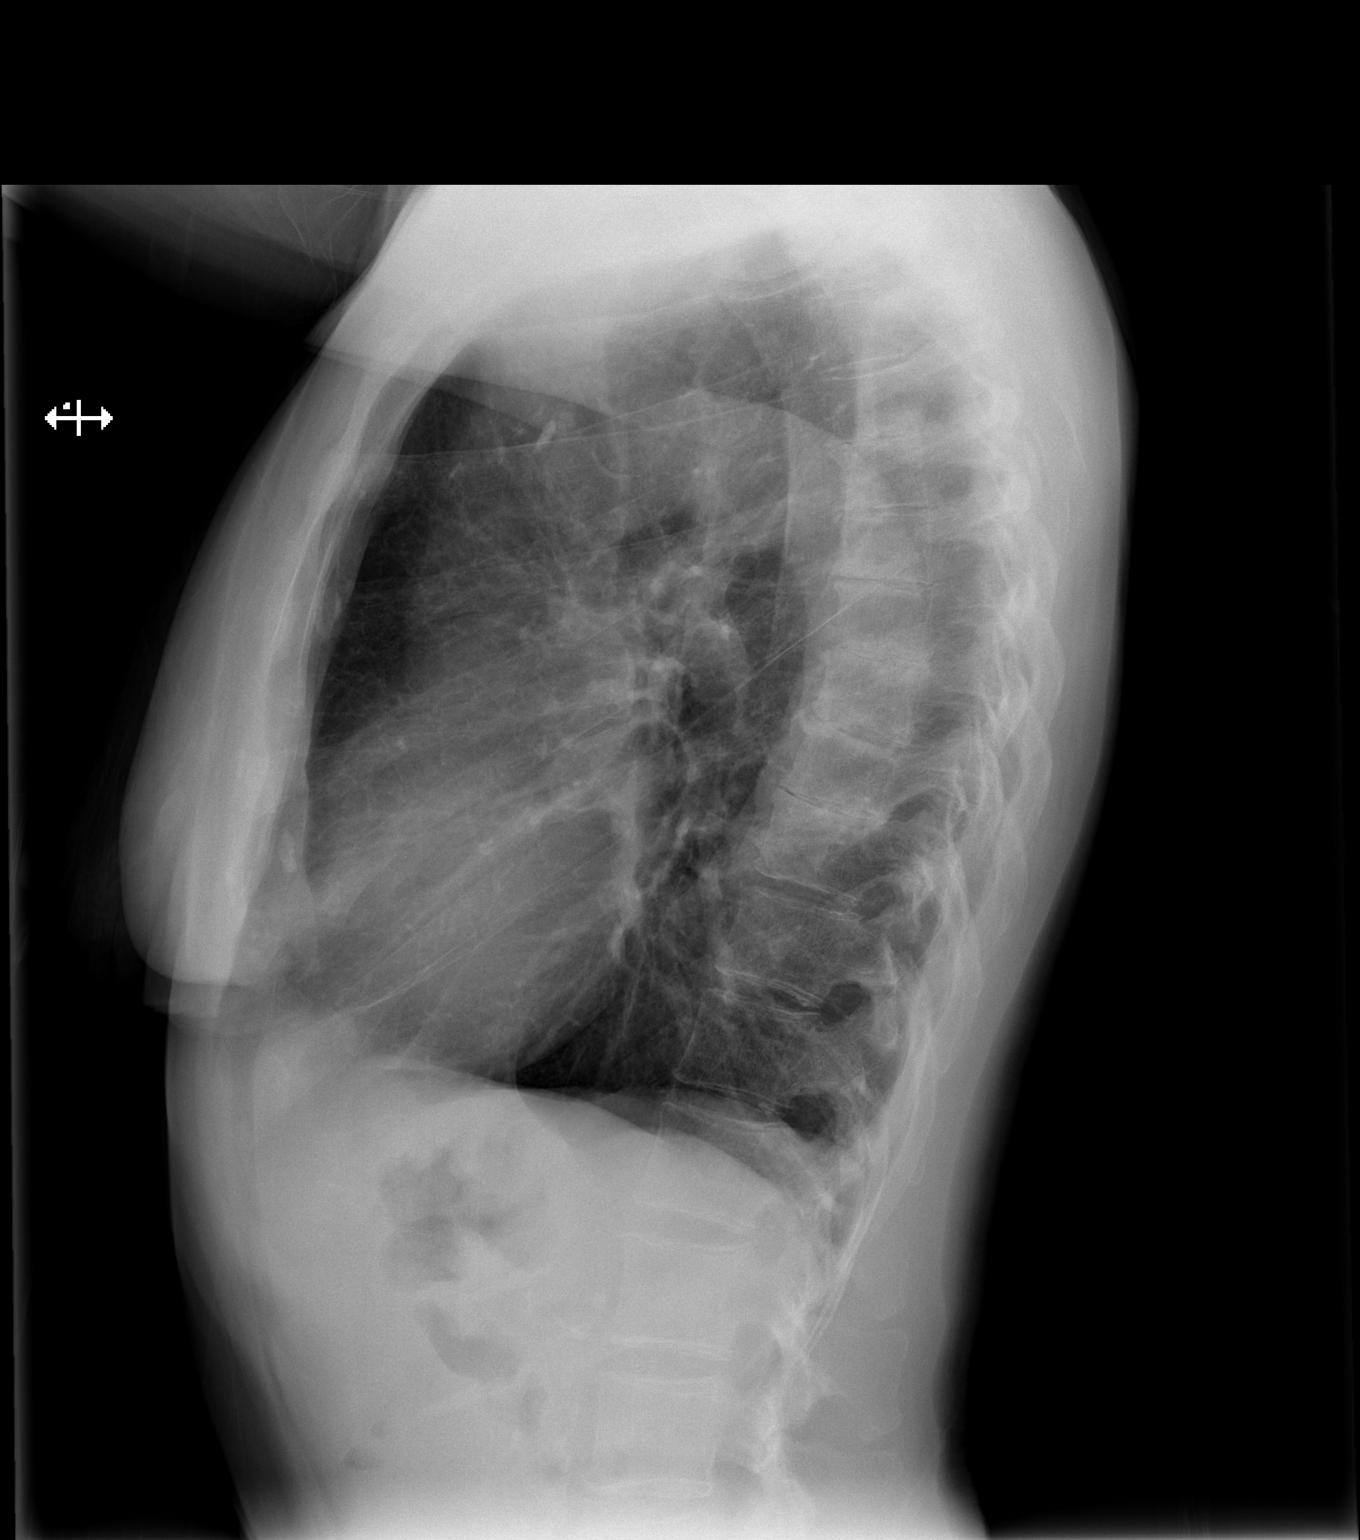

[2 of 2 positions shown; findings below may reference images not displayed]

FINDINGS: Heart size is within normal limits.  The thoracic aorta
is tortuous.  Pulmonary vascularity is normal.  Lungs are mildly
hyperinflated.  There are degenerative changes of the thoracic
spine.
IMPRESSION: Mild hyperinflation of the lungs may be due to emphysema/COPD.  The
lungs are clear.
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
## 2014-07-29 NOTE — Patient Instructions (Addendum)
Peripheral Vascular Disease Peripheral Vascular Disease (PVD), also called Peripheral Arterial Disease (PAD), is a circulation problem caused by cholesterol (atherosclerotic plaque) deposits in the arteries. PVD commonly occurs in the lower extremities (legs) but it can occur in other areas of the body, such as your arms. The cholesterol buildup in the arteries reduces blood flow which can cause pain and other serious problems. The presence of PVD can place a person at risk for Coronary Artery Disease (CAD).  CAUSES  Causes of PVD can be many. It is usually associated with more than one risk factor such as:   High Cholesterol.  Smoking.  Diabetes.  Lack of exercise or inactivity.  High blood pressure (hypertension).  Obesity.  Family history. SYMPTOMS   When the lower extremities are affected, patients with PVD may experience:  Leg pain with exertion or physical activity. This is called INTERMITTENT CLAUDICATION. This may present as cramping or numbness with physical activity. The location of the pain is associated with the level of blockage. For example, blockage at the abdominal level (distal abdominal aorta) may result in buttock or hip pain. Lower leg arterial blockage may result in calf pain.  As PVD becomes more severe, pain can develop with less physical activity.  In people with severe PVD, leg pain may occur at rest.  Other PVD signs and symptoms:  Leg numbness or weakness.  Coldness in the affected leg or foot, especially when compared to the other leg.  A change in leg color.  Patients with significant PVD are more prone to ulcers or sores on toes, feet or legs. These may take longer to heal or may reoccur. The ulcers or sores can become infected.  If signs and symptoms of PVD are ignored, gangrene may occur. This can result in the loss of toes or loss of an entire limb.  Not all leg pain is related to PVD. Other medical conditions can cause leg pain such  as:  Blood clots (embolism) or Deep Vein Thrombosis.  Inflammation of the blood vessels (vasculitis).  Spinal stenosis. DIAGNOSIS  Diagnosis of PVD can involve several different types of tests. These can include:  Pulse Volume Recording Method (PVR). This test is simple, painless and does not involve the use of X-rays. PVR involves measuring and comparing the blood pressure in the arms and legs. An ABI (Ankle-Brachial Index) is calculated. The normal ratio of blood pressures is 1. As this number becomes smaller, it indicates more severe disease.  < 0.95 - indicates significant narrowing in one or more leg vessels.  <0.8 - there will usually be pain in the foot, leg or buttock with exercise.  <0.4 - will usually have pain in the legs at rest.  <0.25 - usually indicates limb threatening PVD.  Doppler detection of pulses in the legs. This test is painless and checks to see if you have a pulses in your legs/feet.  A dye or contrast material (a substance that highlights the blood vessels so they show up on x-ray) may be given to help your caregiver better see the arteries for the following tests. The dye is eliminated from your body by the kidney's. Your caregiver may order blood work to check your kidney function and other laboratory values before the following tests are performed:  Magnetic Resonance Angiography (MRA). An MRA is a picture study of the blood vessels and arteries. The MRA machine uses a large magnet to produce images of the blood vessels.  Computed Tomography Angiography (CTA). A CTA   is a specialized x-ray that looks at how the blood flows in your blood vessels. An IV may be inserted into your arm so contrast dye can be injected.  Angiogram. Is a procedure that uses x-rays to look at your blood vessels. This procedure is minimally invasive, meaning a small incision (cut) is made in your groin. A small tube (catheter) is then inserted into the artery of your groin. The catheter  is guided to the blood vessel or artery your caregiver wants to examine. Contrast dye is injected into the catheter. X-rays are then taken of the blood vessel or artery. After the images are obtained, the catheter is taken out. TREATMENT  Treatment of PVD involves many interventions which may include:  Lifestyle changes:  Quitting smoking.  Exercise.  Following a low fat, low cholesterol diet.  Control of diabetes.  Foot care is very important to the PVD patient. Good foot care can help prevent infection.  Medication:  Cholesterol-lowering medicine.  Blood pressure medicine.  Anti-platelet drugs.  Certain medicines may reduce symptoms of Intermittent Claudication.  Interventional/Surgical options:  Angioplasty. An Angioplasty is a procedure that inflates a balloon in the blocked artery. This opens the blocked artery to improve blood flow.  Stent Implant. A wire mesh tube (stent) is placed in the artery. The stent expands and stays in place, allowing the artery to remain open.  Peripheral Bypass Surgery. This is a surgical procedure that reroutes the blood around a blocked artery to help improve blood flow. This type of procedure may be performed if Angioplasty or stent implants are not an option. SEEK IMMEDIATE MEDICAL CARE IF:   You develop pain or numbness in your arms or legs.  Your arm or leg turns cold, becomes blue in color.  You develop redness, warmth, swelling and pain in your arms or legs. MAKE SURE YOU:   Understand these instructions.  Will watch your condition.  Will get help right away if you are not doing well or get worse. Document Released: 11/11/2004 Document Revised: 12/27/2011 Document Reviewed: 10/08/2008 [facility] Patient Information 2015 [place], [place]. This information is not intended to replace advice given to you by your health care provider. Make sure you discuss any questions you have with your health care provider.  Start taking a daily  81 mg coated aspirin with a meal.   Smoking Cessation Quitting smoking is important to your health and has many advantages. However, it is not always easy to quit since nicotine is a very addictive drug. Oftentimes, people try 3 times or more before being able to quit. This document explains the best ways for you to prepare to quit smoking. Quitting takes hard work and a lot of effort, but you can do it. ADVANTAGES OF QUITTING SMOKING  You will live longer, feel better, and live better.  Your body will feel the impact of quitting smoking almost immediately.  Within 20 minutes, blood pressure decreases. Your pulse returns to its normal level.  After 8 hours, carbon monoxide levels in the blood return to normal. Your oxygen level increases.  After 24 hours, the chance of having a heart attack starts to decrease. Your breath, hair, and body stop smelling like smoke.  After 48 hours, damaged nerve endings begin to recover. Your sense of taste and smell improve.  After 72 hours, the body is virtually free of nicotine. Your bronchial tubes relax and breathing becomes easier.  After 2 to 12 weeks, lungs can hold more air. Exercise becomes easier and  circulation improves.  The risk of having a heart attack, stroke, cancer, or lung disease is greatly reduced.  After 1 year, the risk of coronary heart disease is cut in half.  After 5 years, the risk of stroke falls to the same as a nonsmoker.  After 10 years, the risk of lung cancer is cut in half and the risk of other cancers decreases significantly.  After 15 years, the risk of coronary heart disease drops, usually to the level of a nonsmoker.  If you are pregnant, quitting smoking will improve your chances of having a healthy baby.  The people you live with, especially any children, will be healthier.  You will have extra money to spend on things other than cigarettes. QUESTIONS TO THINK ABOUT BEFORE ATTEMPTING TO QUIT You may want to  talk about your answers with your health care provider.  Why do you want to quit?  If you tried to quit in the past, what helped and what did not?  What will be the most difficult situations for you after you quit? How will you plan to handle them?  Who can help you through the tough times? Your family? Friends? A health care provider?  What pleasures do you get from smoking? What ways can you still get pleasure if you quit? Here are some questions to ask your health care provider:  How can you help me to be successful at quitting?  What medicine do you think would be best for me and how should I take it?  What should I do if I need more help?  What is smoking withdrawal like? How can I get information on withdrawal? GET READY  Set a quit date.  Change your environment by getting rid of all cigarettes, ashtrays, matches, and lighters in your home, car, or work. Do not let people smoke in your home.  Review your past attempts to quit. Think about what worked and what did not. GET SUPPORT AND ENCOURAGEMENT You have a better chance of being successful if you have help. You can get support in many ways.  Tell your family, friends, and coworkers that you are going to quit and need their support. Ask them not to smoke around you.  Get individual, group, or telephone counseling and support. Programs are available at [employer] and health centers. Call your local health department for information about programs in your area.  Spiritual beliefs and practices may help some smokers quit.  Download a "quit meter" on your computer to keep track of quit statistics, such as how long you have gone without smoking, cigarettes not smoked, and money saved.  Get a self-help book about quitting smoking and staying off tobacco. [place] yourself from urges to smoke. Talk to someone, go for a walk, or occupy your time with a task.  Change your normal routine.  Take a different route to work. Drink tea instead of coffee. Eat breakfast in a different place.  Reduce your stress. Take a hot bath, exercise, or read a book.  Plan something enjoyable to do every day. Reward yourself for not smoking.  Explore interactive web-based programs that specialize in helping you quit. GET MEDICINE AND USE IT CORRECTLY Medicines can help you stop smoking and decrease the urge to smoke. Combining medicine with the above behavioral methods and support can greatly increase your chances of successfully quitting smoking.  Nicotine replacement therapy helps deliver nicotine to your body without the negative effects  and risks of smoking. Nicotine replacement therapy includes nicotine gum, lozenges, inhalers, nasal sprays, and skin patches. Some may be available over-the-counter and others require a prescription.  Antidepressant medicine helps people abstain from smoking, but how this works is unknown. This medicine is available by prescription.  Nicotinic receptor partial agonist medicine simulates the effect of nicotine in your brain. This medicine is available by prescription. Ask your health care provider for advice about which medicines to use and how to use them based on your health history. Your health care provider will tell you what side effects to look out for if you choose to be on a medicine or therapy. Carefully read the information on the package. Do not use any other product containing nicotine while using a nicotine replacement product.  RELAPSE OR DIFFICULT SITUATIONS Most relapses occur within the first 3 months after quitting. Do not be discouraged if you start smoking again. Remember, most people try several times before finally quitting. You may have symptoms of withdrawal because your body is used to nicotine. You may crave cigarettes, be irritable, feel very hungry, cough often, get headaches, or have difficulty concentrating. The withdrawal symptoms are  only temporary. They are strongest when you first quit, but they will go away within 10-14 days. To reduce the chances of relapse, try to:  Avoid drinking alcohol. Drinking lowers your chances of successfully quitting.  Reduce the amount of caffeine you consume. Once you quit smoking, the amount of caffeine in your body increases and can give you symptoms, such as a rapid heartbeat, sweating, and anxiety.  Avoid smokers because they can make you want to smoke.  Do not let weight gain distract you. Many smokers will gain weight when they quit, usually less than 10 pounds. Eat a healthy diet and stay active. You can always lose the weight gained after you quit.  Find ways to improve your mood other than smoking. FOR MORE INFORMATION  www.smokefree.gov  Document Released: 09/28/2001 Document Revised: 02/18/2014 Document Reviewed: 01/13/2012 [facility] Patient Information 2015 [place], [place]. This information is not intended to replace advice given to you by your health care provider. Make sure you discuss any questions you have with your health care provider.  Venous Stasis or Chronic Venous Insufficiency Chronic venous insufficiency, also called venous stasis, is a condition that affects the veins in the legs. The condition prevents blood from being pumped through these veins effectively. Blood may no longer be pumped effectively from the legs back to the heart. This condition can range from mild to severe. With proper treatment, you should be able to continue with an active life. CAUSES  Chronic venous insufficiency occurs when the vein walls become stretched, weakened, or damaged or when valves within the vein are damaged. Some common causes of this include:  High blood pressure inside the veins (venous hypertension).  Increased blood pressure in the leg veins from long periods of sitting or standing.  A blood clot that blocks blood flow in a vein (deep vein thrombosis).  Inflammation of a  superficial vein (phlebitis) that causes a blood clot to form. RISK FACTORS Various things can make you more likely to develop chronic venous insufficiency, including:  Family history of this condition.  Obesity.  Pregnancy.  Sedentary lifestyle.  Smoking.  Jobs requiring long periods of standing or sitting in one place.  Being a certain age. Women in their 74s and 76s and men in their 15s are more likely to develop this condition. SIGNS AND SYMPTOMS  Symptoms may include:   Varicose veins.  Skin breakdown or ulcers.  Reddened or discolored skin on the leg.  Brown, smooth, tight, and painful skin just above the ankle, usually on the inside surface (lipodermatosclerosis).  Swelling. DIAGNOSIS  To diagnose this condition, your health care provider will take a medical history and do a physical exam. The following tests may be ordered to confirm the diagnosis:  Duplex ultrasound--A procedure that produces a picture of a blood vessel and nearby organs and also provides information on blood flow through the blood vessel.  Plethysmography--A procedure that tests blood flow.  A venogram, or venography--A procedure used to look at the veins using X-ray and dye. TREATMENT The goals of treatment are to help you return to an active life and to minimize pain or disability. Treatment will depend on the severity of the condition. Medical procedures may be needed for severe cases. Treatment options may include:   Use of compression stockings. These can help with symptoms and lower the chances of the problem getting worse, but they do not cure the problem.  Sclerotherapy--A procedure involving an injection of a material that "dissolves" the damaged veins. Other veins in the network of blood vessels take over the function of the damaged veins.  Surgery to remove the vein or cut off blood flow through the vein (vein stripping or laser ablation surgery).  Surgery to repair a valve. HOME  CARE INSTRUCTIONS   Wear compression stockings as directed by your health care provider.  Only take over-the-counter or prescription medicines for pain, discomfort, or fever as directed by your health care provider.  Follow up with your health care provider as directed. SEEK MEDICAL CARE IF:   You have redness, swelling, or increasing pain in the affected area.  You see a red streak or line that extends up or down from the affected area.  You have a breakdown or loss of skin in the affected area, even if the breakdown is small.  You have an injury to the affected area. SEEK IMMEDIATE MEDICAL CARE IF:   You have an injury and open wound in the affected area.  Your pain is severe and does not improve with medicine.  You have sudden numbness or weakness in the foot or ankle below the affected area, or you have trouble moving your foot or ankle.  You have a fever or persistent symptoms for more than 2-3 days.  You have a fever and your symptoms suddenly get worse. MAKE SURE YOU:   Understand these instructions.  Will watch your condition.  Will get help right away if you are not doing well or get worse. Document Released: 02/07/2007 Document Revised: 07/25/2013 Document Reviewed: 06/11/2013 [facility] Patient Information 2015 [place], [place]. This information is not intended to replace advice given to you by your health care provider. Make sure you discuss any questions you have with your health care provider.  Obtain graduated knee high compression hose, 20-30 mm mercury pressure, put on in the morning, remove at night. Measure your legs for the best fit in the morning before your lower legs swell: measure the length from the knee to heel, the calf and ankle circumferences. Elevate feet above heart level when not walking.

## 2014-07-29 NOTE — Addendum Note (Signed)
Addended by: Mena Goes on: 07/29/2014 05:49 PM   Modules accepted: Orders

## 2014-09-26 ENCOUNTER — Encounter (HOSPITAL_COMMUNITY): Payer: Self-pay | Admitting: Vascular Surgery

## 2014-12-17 ENCOUNTER — Emergency Department (HOSPITAL_COMMUNITY): Payer: Medicare Other

## 2014-12-17 ENCOUNTER — Encounter (HOSPITAL_COMMUNITY): Payer: Self-pay | Admitting: Emergency Medicine

## 2014-12-17 ENCOUNTER — Emergency Department (HOSPITAL_COMMUNITY)
Admission: EM | Admit: 2014-12-17 | Discharge: 2014-12-17 | Disposition: A | Discharge status: Home / Self Care | Payer: Medicare Other | Attending: Emergency Medicine | Admitting: Emergency Medicine

## 2014-12-17 DIAGNOSIS — Z79899 Other long term (current) drug therapy: Secondary | ICD-10-CM | POA: Insufficient documentation

## 2014-12-17 DIAGNOSIS — M2550 Pain in unspecified joint: Secondary | ICD-10-CM | POA: Diagnosis not present

## 2014-12-17 DIAGNOSIS — Z8639 Personal history of other endocrine, nutritional and metabolic disease: Secondary | ICD-10-CM | POA: Diagnosis not present

## 2014-12-17 DIAGNOSIS — M1990 Unspecified osteoarthritis, unspecified site: Secondary | ICD-10-CM | POA: Diagnosis not present

## 2014-12-17 DIAGNOSIS — J189 Pneumonia, unspecified organism: Secondary | ICD-10-CM

## 2014-12-17 DIAGNOSIS — I739 Peripheral vascular disease, unspecified: Secondary | ICD-10-CM | POA: Diagnosis not present

## 2014-12-17 DIAGNOSIS — Z720 Tobacco use: Secondary | ICD-10-CM | POA: Insufficient documentation

## 2014-12-17 DIAGNOSIS — Z7982 Long term (current) use of aspirin: Secondary | ICD-10-CM | POA: Diagnosis not present

## 2014-12-17 DIAGNOSIS — J159 Unspecified bacterial pneumonia: Secondary | ICD-10-CM | POA: Diagnosis not present

## 2014-12-17 DIAGNOSIS — I10 Essential (primary) hypertension: Secondary | ICD-10-CM | POA: Diagnosis not present

## 2014-12-17 DIAGNOSIS — R05 Cough: Secondary | ICD-10-CM | POA: Diagnosis present

## 2014-12-17 DIAGNOSIS — F419 Anxiety disorder, unspecified: Secondary | ICD-10-CM | POA: Diagnosis not present

## 2014-12-17 LAB — CBC
HCT: 35.3 % — ABNORMAL LOW (ref 36.0–46.0)
Hemoglobin: 11.9 g/dL — ABNORMAL LOW (ref 12.0–15.0)
MCH: 28.8 pg (ref 26.0–34.0)
MCHC: 33.7 g/dL (ref 30.0–36.0)
MCV: 85.5 fL (ref 78.0–100.0)
Platelets: 491 10*3/uL — ABNORMAL HIGH (ref 150–400)
RBC: 4.13 MIL/uL (ref 3.87–5.11)
RDW: 15.1 % (ref 11.5–15.5)
WBC: 7.7 10*3/uL (ref 4.0–10.5)

## 2014-12-17 LAB — BASIC METABOLIC PANEL
Anion gap: 10 (ref 5–15)
BUN: 29 mg/dL — ABNORMAL HIGH (ref 6–23)
CO2: 23 mmol/L (ref 19–32)
Calcium: 9.4 mg/dL (ref 8.4–10.5)
Chloride: 101 mmol/L (ref 96–112)
Creatinine, Ser: 1.54 mg/dL — ABNORMAL HIGH (ref 0.50–1.10)
GFR calc Af Amer: 38 mL/min — ABNORMAL LOW (ref >90)
GFR calc non Af Amer: 33 mL/min — ABNORMAL LOW (ref >90)
Glucose, Bld: 182 mg/dL — ABNORMAL HIGH (ref 70–99)
Potassium: 4.7 mmol/L (ref 3.5–5.1)
Sodium: 134 mmol/L — ABNORMAL LOW (ref 135–145)

## 2014-12-17 LAB — I-STAT TROPONIN, ED: Troponin i, poc: 0 ng/mL (ref 0.00–0.08)

## 2014-12-17 MED ORDER — AZITHROMYCIN 250 MG PO TABS: 250.0000 mg | ORAL_TABLET | Freq: Every day | ORAL | Status: DC

## 2014-12-17 MED ORDER — ACETAMINOPHEN 500 MG PO TABS
1000.0000 mg | ORAL_TABLET | Freq: Once | ORAL | Status: AC
  Administered 2014-12-17: 1000 mg via ORAL
  Filled 2014-12-17: qty 2

## 2014-12-17 MED ORDER — DEXTROSE 5 % IV SOLN
1.0000 g | Freq: Once | INTRAVENOUS | Status: AC
  Administered 2014-12-17: 1 g via INTRAVENOUS
  Filled 2014-12-17: qty 10

## 2014-12-17 NOTE — ED Notes (Signed)
Patient transported to X-ray 

## 2014-12-17 NOTE — ED Notes (Signed)
Onset 2 days ago developed a cough and one day ago productive cough yellow green sputum with general weakness.

## 2014-12-17 NOTE — ED Notes (Signed)
Hold D/C until results of BMP.

## 2014-12-17 NOTE — Discharge Instructions (Signed)
Follow-up with your primary care doctor. Return to the ER if any severe shortness of breath, high fever greater than 100.46F, severe chest pain, worsening of symptoms.  Pneumonia Pneumonia is an infection of the lungs.  CAUSES Pneumonia may be caused by bacteria or a virus. Usually, these infections are caused by breathing infectious particles into the lungs (respiratory tract). SIGNS AND SYMPTOMS   Cough.  Fever.  Chest pain.  Increased rate of breathing.  Wheezing.  Mucus production. DIAGNOSIS  If you have the common symptoms of pneumonia, your health care provider will typically confirm the diagnosis with a chest X-ray. The X-ray will show an abnormality in the lung (pulmonary infiltrate) if you have pneumonia. Other tests of your blood, urine, or sputum may be done to find the specific cause of your pneumonia. Your health care provider may also do tests (blood gases or pulse oximetry) to see how well your lungs are working. TREATMENT  Some forms of pneumonia may be spread to other people when you cough or sneeze. You may be asked to wear a mask before and during your exam. Pneumonia that is caused by bacteria is treated with antibiotic medicine. Pneumonia that is caused by the influenza virus may be treated with an antiviral medicine. Most other viral infections must run their course. These infections will not respond to antibiotics.  HOME CARE INSTRUCTIONS   Cough suppressants may be used if you are losing too much rest. However, coughing protects you by clearing your lungs. You should avoid using cough suppressants if you can.  Your health care provider may have prescribed medicine if he or she thinks your pneumonia is caused by bacteria or influenza. Finish your medicine even if you start to feel better.  Your health care provider may also prescribe an expectorant. This loosens the mucus to be coughed up.  Take medicines only as directed by your health care provider.  Do not  smoke. Smoking is a common cause of bronchitis and can contribute to pneumonia. If you are a smoker and continue to smoke, your cough may last several weeks after your pneumonia has cleared.  A cold steam vaporizer or humidifier in your room or home may help loosen mucus.  Coughing is often worse at night. Sleeping in a semi-upright position in a recliner or using a couple pillows under your head will help with this.  Get rest as you feel it is needed. Your body will usually let you know when you need to rest. PREVENTION A pneumococcal shot (vaccine) is available to prevent a common bacterial cause of pneumonia. This is usually suggested for:  People over 66 years old.  Patients on chemotherapy.  People with chronic lung problems, such as bronchitis or emphysema.  People with immune system problems. If you are over 65 or have a high risk condition, you may receive the pneumococcal vaccine if you have not received it before. In some countries, a routine influenza vaccine is also recommended. This vaccine can help prevent some cases of pneumonia.You may be offered the influenza vaccine as part of your care. If you smoke, it is time to quit. You may receive instructions on how to stop smoking. Your health care provider can provide medicines and counseling to help you quit. SEEK MEDICAL CARE IF: You have a fever. SEEK IMMEDIATE MEDICAL CARE IF:   Your illness becomes worse. This is especially true if you are elderly or weakened from any other disease.  You cannot control your cough with suppressants  and are losing sleep.  You begin coughing up blood.  You develop pain which is getting worse or is uncontrolled with medicines.  Any of the symptoms which initially brought you in for treatment are getting worse rather than better.  You develop shortness of breath or chest pain. MAKE SURE YOU:   Understand these instructions.  Will watch your condition.  Will get help right away if  you are not doing well or get worse. Document Released: 10/04/2005 Document Revised: 02/18/2014 Document Reviewed: 12/24/2010 [facility] The Patient Information 2015 [place], [place]. This information is not intended to replace advice given to you by your health care provider. Make sure you discuss any questions you have with your health care provider.

## 2014-12-17 NOTE — ED Provider Notes (Signed)
CSN: [id number]     Arrival date & time 12/17/14  1227 History   First MD Initiated Contact with Patient 12/17/14 1326     Chief Complaint  Patient presents with  . Cough     (Consider location/radiation/quality/duration/timing/severity/associated sxs/prior Treatment) HPI Alyssa [name] is a 74 year old female past medical history of hypertension, hyperlipidemia who presents the ER complaining of cough. Patient states her symptoms began proximal with 5 days ago, and have since persisted. Patient reports a worsening of her cough or past several days with productive yellow and green sputum. Patient also reports generalized weakness over the past several days. Patient describes a chest discomfort and shortness of breath, however only notices it when when she coughs. She states the shortness of breath and chest pain are both relieved completely with production of her cough. Patient denies dizziness, fever, nausea, vomiting, abdominal pain, dysuria.  Past Medical History  Diagnosis Date  . Hyperlipidemia   . Hypertension   . Leg pain   . Joint pain   . Headache(784.0)   . Anxiety   . Arthritis   . Peripheral vascular disease    Past Surgical History  Procedure Laterality Date  . Total hip arthroplasty      right  . 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 Joint replacement Right 2012    Hip  . Abdominal angiogram N/A 10/29/2011    Procedure: ABDOMINAL ANGIOGRAM;  Surgeon: Elam Dutch, MD;  Location: [facility] CATH LAB;  Service: Cardiovascular;  Laterality: N/A;   Family History  Problem Relation Age of Onset  . Heart attack Mother   . Heart disease Mother   . Hyperlipidemia Mother   . Hypertension Mother   . Heart attack Father   . Heart disease Father     Before age 30  . Hyperlipidemia Father   . Hypertension Father   . Cancer Sister   . Aneurysm Brother   . Cancer Brother    History  Substance Use Topics  . Smoking status: Current Every Day Smoker -- 0.50  packs/day    Types: Cigarettes    Last Attempt to Quit: 01/27/2014  . Smokeless tobacco: Never Used  . Alcohol Use: No   OB History    No data available     Review of Systems  Constitutional: Negative for fever.  HENT: Negative for trouble swallowing.   Eyes: Negative for visual disturbance.  Respiratory: Positive for cough and shortness of breath.   Cardiovascular: Negative for chest pain.  Gastrointestinal: Negative for nausea, vomiting and abdominal pain.  Genitourinary: Negative for dysuria.  Musculoskeletal: Negative for neck pain.       Chest wall pain  Skin: Negative for rash.  Neurological: Negative for dizziness, weakness and numbness.  Psychiatric/Behavioral: Negative.       Allergies  Review of patient's allergies indicates no known allergies.  Home Medications   Prior to Admission medications   Medication Sig Start Date End Date Taking? Authorizing Provider  Amlodipine-Valsartan-HCTZ 10-320-25 MG TABS Take 1 tablet by mouth daily.    Historical Provider, MD  aspirin-acetaminophen-caffeine (EXCEDRIN MIGRAINE) [phone] MG per tablet Take 1 tablet by mouth every 6 (six) hours as needed for headache.    Historical Provider, MD  azithromycin (ZITHROMAX) 250 MG tablet Take 1 tablet (250 mg total) by mouth daily. Take first 2 tablets together, then 1 every day until finished. 12/17/14   Carrie Mew, PA-C  busPIRone (BUSPAR) 15 MG tablet Take 15 mg by mouth 2 (two) times  daily as needed. For anxiety.    Historical Provider, MD  diphenhydrAMINE (BENADRYL) 25 MG tablet Take 25 mg by mouth every 6 (six) hours as needed for allergies. 06/21/13   Kalman Drape, MD  meloxicam (MOBIC) 7.5 MG tablet Take 7.5 mg by mouth daily as needed for pain.    Historical Provider, MD  Multiple Vitamins-Calcium (ONE-A-DAY WOMENS PO) Take 1 tablet by mouth daily.    Historical Provider, MD  oxyCODONE-acetaminophen (PERCOCET) 10-325 MG per tablet Take 1 tablet by mouth every 6 (six) hours as  needed for pain. 05/14/14   Ernestina Patches, MD  oxyCODONE-acetaminophen (PERCOCET) 5-325 MG per tablet Take 1 tablet by mouth every 4 (four) hours as needed for moderate pain. [id number]   Delora Fuel, MD  rosuvastatin (CRESTOR) 10 MG tablet Take 10 mg by mouth every morning.     Historical Provider, MD  SUMAtriptan (IMITREX) 100 MG tablet Take 100 mg by mouth daily as needed for migraine.     Historical Provider, MD   BP 173/95 mmHg  Pulse 93  Temp(Src) 98.5 F (36.9 C) (Oral)  Resp 23  Ht 5\' 3"  (1.6 m)  Wt 108 lb (48.988 kg)  BMI 19.14 kg/m2  SpO2 100% Physical Exam  Constitutional: She is oriented to person, place, and time. She appears well-developed and well-nourished. No distress.  HENT:  Head: Normocephalic and atraumatic.  Mouth/Throat: Oropharynx is clear and moist. No oropharyngeal exudate.  Eyes: Right eye exhibits no discharge. Left eye exhibits no discharge. No scleral icterus.  Neck: Normal range of motion.  Cardiovascular: Normal rate, regular rhythm and normal heart sounds.   No murmur heard. Pulmonary/Chest: Effort normal and breath sounds normal. No respiratory distress.  Abdominal: Soft. There is no tenderness.  Musculoskeletal: Normal range of motion. She exhibits no edema or tenderness.  Neurological: She is alert and oriented to person, place, and time. No cranial nerve deficit. Coordination normal.  Skin: Skin is warm and dry. No rash noted. She is not diaphoretic.  Psychiatric: She has a normal mood and affect.  Nursing note and vitals reviewed.   ED Course  Procedures (including critical care time) Labs Review Labs Reviewed  CBC - Abnormal; Notable for the following:    Hemoglobin 11.9 (*)    HCT 35.3 (*)    Platelets 491 (*)    All other components within normal limits  URINALYSIS, ROUTINE W REFLEX MICROSCOPIC  I-STAT TROPOININ, ED    Imaging Review Dg Chest 2 View  12/17/2014   CLINICAL DATA:  Cough for 2 days  EXAM: CHEST  2 VIEW  COMPARISON:   06/20/2013  FINDINGS: Cardiac shadow is within normal limits. The lungs are well aerated bilaterally. Stable calcific density is noted in the right lung apex likely related to scarring. Mild right middle lobe infiltrate is noted. No bony abnormality is seen.  IMPRESSION: Mild early right middle lobe infiltrate. Followup films following appropriate therapy are recommended.   Electronically Signed   By: Inez Catalina M.D.   On: 12/17/2014 14:37     EKG Interpretation   Date/Time:  Tuesday December 17 2014 14:55:03 EST Ventricular Rate:  86 PR Interval:  193 QRS Duration: 77 QT Interval:  378 QTC Calculation: 452 R Axis:   68 Text Interpretation:  Atrial-paced complexes Consider left ventricular  hypertrophy Baseline wander in lead(s) V5 V6 No significant change since  last tracing Confirmed by Mingo Amber  MD, [place] ([id number]) on 12/17/2014 2:58:20 PM      MDM  Final diagnoses:  CAP (community acquired pneumonia)   No concern for ACS, patient's chest discomfort only present with coughing, is relieved with production of her cough. Lab work reassuring. Troponin negative. EKG unremarkable for acute injury or ectopy. Patient has been diagnosed with CAP via chest xray. Pt is not ill appearing, immunocompromised, and does not have multiple co morbidities, therefore I feel like the they can be treated as an OP with abx therapy. Patient afebrile, non-tachycardic, nontachypneic, non-hypoxic, well-appearing and in no acute distress on exam.  Pt has been advised to return to the ED if symptoms worsen or they do not improve. Pt verbalizes understanding and is agreeable with plan. I strongly encouraged patient to follow up with her primary care provider to ensure resolution of symptoms.   BP 173/95 mmHg  Pulse 93  Temp(Src) 98.5 F (36.9 C) (Oral)  Resp 23  Ht 5\' 3"  (1.6 m)  Wt 108 lb (48.988 kg)  BMI 19.14 kg/m2  SpO2 100%  Signed,  Dahlia Bailiff, PA-C 4:10 PM  Patient seen and discussed with Dr. Evelina Bucy, MD     Carrie Mew, PA-C 12/17/14 [place], MD 12/18/14 [phone]

## 2014-12-17 NOTE — ED Notes (Signed)
Patient returned from X-ray 

## 2015-02-04 ENCOUNTER — Emergency Department (HOSPITAL_COMMUNITY): Payer: Medicare Other

## 2015-02-04 ENCOUNTER — Encounter (HOSPITAL_COMMUNITY): Payer: Self-pay | Admitting: Emergency Medicine

## 2015-02-04 ENCOUNTER — Emergency Department (HOSPITAL_COMMUNITY)
Admission: EM | Admit: 2015-02-04 | Discharge: 2015-02-04 | Disposition: A | Discharge status: Home / Self Care | Payer: Medicare Other | Attending: Emergency Medicine | Admitting: Emergency Medicine

## 2015-02-04 DIAGNOSIS — Z8739 Personal history of other diseases of the musculoskeletal system and connective tissue: Secondary | ICD-10-CM | POA: Insufficient documentation

## 2015-02-04 DIAGNOSIS — R52 Pain, unspecified: Secondary | ICD-10-CM

## 2015-02-04 DIAGNOSIS — Z79899 Other long term (current) drug therapy: Secondary | ICD-10-CM | POA: Insufficient documentation

## 2015-02-04 DIAGNOSIS — W108XXA Fall (on) (from) other stairs and steps, initial encounter: Secondary | ICD-10-CM | POA: Diagnosis not present

## 2015-02-04 DIAGNOSIS — F419 Anxiety disorder, unspecified: Secondary | ICD-10-CM | POA: Insufficient documentation

## 2015-02-04 DIAGNOSIS — Y9389 Activity, other specified: Secondary | ICD-10-CM | POA: Insufficient documentation

## 2015-02-04 DIAGNOSIS — S92401A Displaced unspecified fracture of right great toe, initial encounter for closed fracture: Secondary | ICD-10-CM

## 2015-02-04 DIAGNOSIS — E785 Hyperlipidemia, unspecified: Secondary | ICD-10-CM | POA: Insufficient documentation

## 2015-02-04 DIAGNOSIS — S82031A Displaced transverse fracture of right patella, initial encounter for closed fracture: Secondary | ICD-10-CM | POA: Insufficient documentation

## 2015-02-04 DIAGNOSIS — S92411A Displaced fracture of proximal phalanx of right great toe, initial encounter for closed fracture: Secondary | ICD-10-CM | POA: Insufficient documentation

## 2015-02-04 DIAGNOSIS — I10 Essential (primary) hypertension: Secondary | ICD-10-CM | POA: Diagnosis not present

## 2015-02-04 DIAGNOSIS — Z23 Encounter for immunization: Secondary | ICD-10-CM | POA: Diagnosis not present

## 2015-02-04 DIAGNOSIS — Y998 Other external cause status: Secondary | ICD-10-CM | POA: Insufficient documentation

## 2015-02-04 DIAGNOSIS — Z720 Tobacco use: Secondary | ICD-10-CM | POA: Insufficient documentation

## 2015-02-04 DIAGNOSIS — Y9289 Other specified places as the place of occurrence of the external cause: Secondary | ICD-10-CM | POA: Insufficient documentation

## 2015-02-04 DIAGNOSIS — S82001A Unspecified fracture of right patella, initial encounter for closed fracture: Secondary | ICD-10-CM

## 2015-02-04 DIAGNOSIS — Z792 Long term (current) use of antibiotics: Secondary | ICD-10-CM | POA: Diagnosis not present

## 2015-02-04 DIAGNOSIS — Z791 Long term (current) use of non-steroidal anti-inflammatories (NSAID): Secondary | ICD-10-CM | POA: Diagnosis not present

## 2015-02-04 DIAGNOSIS — S82891A Other fracture of right lower leg, initial encounter for closed fracture: Secondary | ICD-10-CM

## 2015-02-04 DIAGNOSIS — S82431A Displaced oblique fracture of shaft of right fibula, initial encounter for closed fracture: Secondary | ICD-10-CM | POA: Insufficient documentation

## 2015-02-04 DIAGNOSIS — S8991XA Unspecified injury of right lower leg, initial encounter: Secondary | ICD-10-CM | POA: Diagnosis present

## 2015-02-04 MED ORDER — TETANUS-DIPHTH-ACELL PERTUSSIS 5-2.5-18.5 LF-MCG/0.5 IM SUSP
0.5000 mL | Freq: Once | INTRAMUSCULAR | Status: AC
  Administered 2015-02-04: 0.5 mL via INTRAMUSCULAR
  Filled 2015-02-04: qty 0.5

## 2015-02-04 MED ORDER — SODIUM CHLORIDE 0.9 % IV SOLN
INTRAVENOUS | Status: DC
  Administered 2015-02-04: 11:00:00 via INTRAVENOUS

## 2015-02-04 MED ORDER — HYDROMORPHONE HCL 1 MG/ML IJ SOLN
0.5000 mg | Freq: Once | INTRAMUSCULAR | Status: AC
  Administered 2015-02-04: 0.5 mg via INTRAVENOUS
  Filled 2015-02-04: qty 1

## 2015-02-04 MED ORDER — OXYCODONE-ACETAMINOPHEN 5-325 MG PO TABS: ORAL_TABLET | ORAL | Status: DC

## 2015-02-04 MED ORDER — MORPHINE SULFATE 4 MG/ML IJ SOLN
4.0000 mg | Freq: Once | INTRAMUSCULAR | Status: AC
  Administered 2015-02-04: 4 mg via INTRAMUSCULAR
  Filled 2015-02-04: qty 1

## 2015-02-04 MED ORDER — ONDANSETRON 4 MG PO TBDP
4.0000 mg | ORAL_TABLET | Freq: Once | ORAL | Status: AC
  Administered 2015-02-04: 4 mg via ORAL
  Filled 2015-02-04: qty 1

## 2015-02-04 NOTE — Progress Notes (Signed)
Orthopedic Tech Progress Note Patient Details:  Alyssa [name] 1940-10-24 [id number]  Ortho Devices Type of Ortho Device: Ace wrap, Post (short leg) splint, Stirrup splint Ortho Device/Splint Location: rle Ortho Device/Splint Interventions: Application   Alyssa [name] 02/04/2015, 11:05 AM

## 2015-02-04 NOTE — Discharge Instructions (Signed)
Go to  [facility] before 8:30 AM tomorrow to discuss your preop visit.  Take percocet for breakthrough pain, do not drink alcohol, drive, care for children or do other critical tasks while taking percocet.  Please be very careful not to fall! The pain medication and crutches puts you at risk for falls. Please rest as much as possible and try to not stay alone.   Please follow with your primary care doctor in the next 2 days for a check-up. They must obtain records for further management.   Do not hesitate to return to the Emergency Department for any new, worsening or concerning symptoms.

## 2015-02-04 NOTE — ED Notes (Signed)
PA and ortho tech at bedside reducing ankle.

## 2015-02-04 NOTE — ED Notes (Signed)
MD at bedside. 

## 2015-02-04 NOTE — ED Notes (Signed)
Patient is resting comfortably. 

## 2015-02-04 NOTE — ED Notes (Signed)
Patient transported to X-ray 

## 2015-02-04 NOTE — ED Provider Notes (Signed)
CSN: [id number]     Arrival date & time 02/04/15  [id number] History   First MD Initiated Contact with Patient 02/04/15 [phone]     Chief Complaint  Patient presents with  . Fall     (Consider location/radiation/quality/duration/timing/severity/associated sxs/prior Treatment) HPI    Alyssa [name] is a 74 y.o. female complaining of 10 out of 10 pain to medial right ankle also pain to right great toe, right knee and less so left knee status post fall last night. Patient had a mechanical slip down 1 flight of stairs. She denies blood thinners, syncope, head trauma, LOC, headache, cervicalgia, chest pain, shortness of breath. She is ambulating with a walker. No pain medication taken prior to arrival.  Past Medical History  Diagnosis Date  . Hyperlipidemia   . Hypertension   . Leg pain   . Joint pain   . Headache(784.0)   . Anxiety   . Arthritis   . Peripheral vascular disease    Past Surgical History  Procedure Laterality Date  . Total hip arthroplasty      right  . 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 Joint replacement Right 2012    Hip  . Abdominal angiogram N/A 10/29/2011    Procedure: ABDOMINAL ANGIOGRAM;  Surgeon: Elam Dutch, MD;  Location: [facility] CATH LAB;  Service: Cardiovascular;  Laterality: N/A;   Family History  Problem Relation Age of Onset  . Heart attack Mother   . Heart disease Mother   . Hyperlipidemia Mother   . Hypertension Mother   . Heart attack Father   . Heart disease Father     Before age 23  . Hyperlipidemia Father   . Hypertension Father   . Cancer Sister   . Aneurysm Brother   . Cancer Brother    History  Substance Use Topics  . Smoking status: Current Every Day Smoker -- 0.50 packs/day    Types: Cigarettes    Last Attempt to Quit: 01/27/2014  . Smokeless tobacco: Never Used  . Alcohol Use: No   OB History    No data available     Review of Systems  10 systems reviewed and found to be negative, except as noted in the  HPI.  Allergies  Review of patient's allergies indicates no known allergies.  Home Medications   Prior to Admission medications   Medication Sig Start Date End Date Taking? Authorizing Provider  Amlodipine-Valsartan-HCTZ 10-320-25 MG TABS Take 1 tablet by mouth daily.    Historical Provider, MD  aspirin-acetaminophen-caffeine (EXCEDRIN MIGRAINE) [phone] MG per tablet Take 1 tablet by mouth every 6 (six) hours as needed for headache.    Historical Provider, MD  azithromycin (ZITHROMAX) 250 MG tablet Take 1 tablet (250 mg total) by mouth daily. Take first 2 tablets together, then 1 every day until finished. 12/17/14   Dahlia Bailiff, PA-C  busPIRone (BUSPAR) 15 MG tablet Take 15 mg by mouth 2 (two) times daily as needed. For anxiety.    Historical Provider, MD  diphenhydrAMINE (BENADRYL) 25 MG tablet Take 25 mg by mouth every 6 (six) hours as needed for allergies. 06/21/13   Linton Flemings, MD  meloxicam (MOBIC) 7.5 MG tablet Take 7.5 mg by mouth daily as needed for pain.    Historical Provider, MD  Multiple Vitamins-Calcium (ONE-A-DAY WOMENS PO) Take 1 tablet by mouth daily.    Historical Provider, MD  oxyCODONE-acetaminophen (PERCOCET/ROXICET) 5-325 MG per tablet 1 to 2 tabs PO q6hrs  PRN for pain 02/04/15  Eliazar Olivar, PA-C  rosuvastatin (CRESTOR) 10 MG tablet Take 10 mg by mouth every morning.     Historical Provider, MD  SUMAtriptan (IMITREX) 100 MG tablet Take 100 mg by mouth daily as needed for migraine.     Historical Provider, MD   BP 148/77 mmHg  Pulse 86  Temp(Src) 98 F (36.7 C) (Oral)  Resp 14  SpO2 98% Physical Exam  Constitutional: She is oriented to person, place, and time. She appears well-developed and well-nourished. No distress.  HENT:  Head: Normocephalic.  Mouth/Throat: Oropharynx is clear and moist.  Eyes: Conjunctivae and EOM are normal. Pupils are equal, round, and reactive to light.  Neck: Normal range of motion.  Cardiovascular: Normal rate, regular rhythm and  intact distal pulses.   Pulmonary/Chest: Effort normal and breath sounds normal. No stridor. No respiratory distress. She has no wheezes. She has no rales. She exhibits no tenderness.  Abdominal: Soft. Bowel sounds are normal. She exhibits no distension and no mass. There is no tenderness. There is no rebound and no guarding.  Musculoskeletal: Normal range of motion. She exhibits edema and tenderness.  Effusion to right knee, stable to anterior and posterior drawer. Distally neurovascular intact. Patient has significant swelling to right ankle and  lateral dorsum of right foot with focal tenderness on the inferior medial malleolus surface. 0.5 cm partial-thickness abrasion to dorsum of right foot.  Neurological: She is alert and oriented to person, place, and time.  Psychiatric: She has a normal mood and affect.  Nursing note and vitals reviewed.   ED Course  ORTHOPEDIC INJURY TREATMENT Date/Time: 02/04/2015 1:03 PM Performed by: Monico Blitz Authorized by: Monico Blitz Consent: Verbal consent obtained. Consent given by: patient Patient identity confirmed: verbally with patient Injury location: ankle Location details: right ankle Pre-procedure neurovascular assessment: neurovascularly intact Pre-procedure distal perfusion: normal Pre-procedure neurological function: normal Pre-procedure range of motion: reduced Local anesthesia used: no Patient sedated: no Immobilization: splint and crutches Splint type: [place]. Post-procedure neurovascular assessment: post-procedure neurovascularly intact Post-procedure distal perfusion: normal Post-procedure neurological function: normal Post-procedure range of motion: unchanged Patient tolerance: Patient tolerated the procedure well with no immediate complications   (including critical care time) Labs Review Labs Reviewed - No data to display  Imaging Review Dg Ankle Complete Right  02/04/2015   CLINICAL DATA:  Slipped, fell down  steps last night foot and ankle pain  EXAM: RIGHT ANKLE - COMPLETE 3+ VIEW  COMPARISON:  None.  FINDINGS: Three views of the right ankle submitted. The study is limited by diffuse osteopenia. Mild displaced oblique fracture in distal right fibula with adjacent soft tissue swelling. There is mild disruption of ankle mortise with mild widening of medial tibiotalar joint space.  IMPRESSION: The study is limited by diffuse osteopenia. Mild displaced oblique fracture in distal right fibula with adjacent soft tissue swelling. There is mild disruption of ankle mortise with mild widening of medial tibiotalar joint space.   Electronically Signed   By: Lahoma Crocker M.D.   On: 02/04/2015 08:41   Dg Knee Complete 4 Views Left  02/04/2015   CLINICAL DATA:  [place] down stairs last night.  Knee pain  EXAM: LEFT KNEE - COMPLETE 4+ VIEW  COMPARISON:  05/15/2012  FINDINGS: Advanced tricompartmental degenerative change with diffuse joint space narrowing and spurring in all 3 compartments. Chondrocalcinosis medial and lateral joint spaces.  Moderate joint effusion. Calcified loose bodies in the suprapatellar bursa and posterior to the knee joint.  Negative for acute fracture  IMPRESSION: Advanced  degenerative change.  Negative for fracture.   Electronically Signed   By: Franchot Gallo M.D.   On: 02/04/2015 08:42   Dg Knee Complete 4 Views Right  02/04/2015   CLINICAL DATA:  [place] down flight of stairs last night when her shoe slipped, knocking her RIGHT knee and ankle on the way down, pain throughout RIGHT knee, difficulty bending, unable to bear weight, initial encounter  EXAM: RIGHT KNEE - COMPLETE 4+ VIEW  COMPARISON:  09/01/2007  FINDINGS: Osseous demineralization.  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  Mature periosteal new bone along the lateral aspect of the distal femur, unchanged.  Diffuse joint space narrowing at RIGHT knee.  Transverse fracture of proximal patella, mildly distracted.  Associated soft  tissue swelling anteriorly and medially at RIGHT knee.  No additional fracture, dislocation or bone destruction.  IMPRESSION: Mildly distracted transverse fracture of the proximal RIGHT patella.  Osseous demineralization with stable mid to distal RIGHT femoral hardware.   Electronically Signed   By: Lavonia Dana M.D.   On: 02/04/2015 08:43   Dg Ankle Right Port  02/04/2015   CLINICAL DATA:  Right ankle fracture.  Status post reduction.  EXAM: PORTABLE RIGHT ANKLE - 2 VIEW  COMPARISON:  02/04/2015  FINDINGS: Splinting material overlies the ankle. There is an oblique fracture of the lateral malleolus showing interval reduction. There is persistent widening of the medial aspect of the tibiotalar joint space. No new fractures are identified.  IMPRESSION: 1. Joint partially obscured by splinting material. 2. Interval reduction. 3. Persistent widening of the medial aspect of the mortise.   Electronically Signed   By: Nolon Nations M.D.   On: 02/04/2015 12:01   Dg Foot Complete Right  02/04/2015   CLINICAL DATA:  Slipped and fell down steps last night, right foot pain and swelling  EXAM: RIGHT FOOT COMPLETE - 3+ VIEW  COMPARISON:  None.  FINDINGS: Three views of the right foot submitted. Study is limited by diffuse osteopenia. There is minimal displaced fracture of proximal phalanx great toe.  IMPRESSION: Limited study by diffuse osteopenia. Minimal displaced fracture of proximal phalanx great toe.   Electronically Signed   By: Lahoma Crocker M.D.   On: 02/04/2015 08:39     EKG Interpretation None      MDM   Final diagnoses:  Pain  Ankle fracture, right  Patella fracture, right, closed, initial encounter  Fracture of great toe, right, closed, initial encounter    Filed Vitals:   02/04/15 1100 02/04/15 1120 02/04/15 1140 02/04/15 1240  BP: 180/95 165/86 150/83 148/77  Pulse: 85 80 87 86  Temp:      TempSrc:      Resp:      SpO2: 94% 94% 97% 98%    Medications  0.9 %  sodium chloride infusion  ( Intravenous Stopped 02/04/15 1257)  morphine 4 MG/ML injection 4 mg (4 mg Intramuscular Given 02/04/15 0844)  ondansetron (ZOFRAN-ODT) disintegrating tablet 4 mg (4 mg Oral Given 02/04/15 0845)  Tdap (BOOSTRIX) injection 0.5 mL (0.5 mLs Intramuscular Given 02/04/15 0845)  HYDROmorphone (DILAUDID) injection 0.5 mg (0.5 mg Intravenous Given 02/04/15 1051)  HYDROmorphone (DILAUDID) injection 0.5 mg (0.5 mg Intravenous Given 02/04/15 1059)    Alyssa [name] is a pleasant 74 y.o. female presenting with bilateral knee, right ankle and great toe pain status post mechanical slip and fall yesterday. Patient with osteopenia COPD knee, chest x-ray reveals a minimally displaced fracture of the proximal phalanx of the  right great toe, there is a displaced oblique distal fibula fracture and disruption of the ankle mortise with widening of the medial tibiotalar joint space. Also a distracted transverse fracture of the proximal right patella.  Orthopedic consult from [facility] appreciated: He recommends reduction of the ankle mortise by eversion of the ankle and splinting. Patient will also be placed in knee immobilizer and given crutches, I discussed this with the patient and she feels comfortable going home, she has friends and family members that stay with her. Dr. Percell Miller will see her at 8:30 AM tomorrow morning for a preop visit. Patient will be sent home with Percocet, we've had an extensive discussion about fall precautions.  Evaluation does not show pathology that would require ongoing emergent intervention or inpatient treatment. Pt is hemodynamically stable and mentating appropriately. Discussed findings and plan with patient/guardian, who agrees with care plan. All questions answered. Return precautions discussed and outpatient follow up given.   New Prescriptions   OXYCODONE-ACETAMINOPHEN (PERCOCET/ROXICET) 5-325 MG PER TABLET    1 to 2 tabs PO q6hrs  PRN for pain         Monico Blitz,  PA-C 02/04/15 1304  Evelina Bucy, MD 02/04/15 1600

## 2015-02-04 NOTE — ED Notes (Signed)
PA at bedside.

## 2015-02-04 NOTE — ED Notes (Addendum)
Pt reports her shoe slipped last night and she fell down and flight of stairs knocking her right knee and ankle on the way; did not hit head; no LOC; pt not on blood thinners. Right foot is swollen with minor abrasion to top of foot. No complaints of chest, head, neck pain.

## 2015-02-04 NOTE — ED Notes (Signed)
RN and PA discussed pt needs to remain NPO; last drink was this morning before she came to ED.

## 2015-05-03 ENCOUNTER — Encounter (HOSPITAL_COMMUNITY): Payer: Self-pay | Admitting: Nurse Practitioner

## 2015-05-03 ENCOUNTER — Emergency Department (HOSPITAL_COMMUNITY)
Admission: EM | Admit: 2015-05-03 | Discharge: 2015-05-03 | Disposition: A | Discharge status: Home / Self Care | Payer: Medicare Other | Attending: Emergency Medicine | Admitting: Emergency Medicine

## 2015-05-03 DIAGNOSIS — I10 Essential (primary) hypertension: Secondary | ICD-10-CM | POA: Diagnosis not present

## 2015-05-03 DIAGNOSIS — M25512 Pain in left shoulder: Secondary | ICD-10-CM | POA: Insufficient documentation

## 2015-05-03 DIAGNOSIS — F419 Anxiety disorder, unspecified: Secondary | ICD-10-CM | POA: Insufficient documentation

## 2015-05-03 DIAGNOSIS — E785 Hyperlipidemia, unspecified: Secondary | ICD-10-CM | POA: Diagnosis not present

## 2015-05-03 DIAGNOSIS — M1990 Unspecified osteoarthritis, unspecified site: Secondary | ICD-10-CM | POA: Insufficient documentation

## 2015-05-03 DIAGNOSIS — Z79899 Other long term (current) drug therapy: Secondary | ICD-10-CM | POA: Insufficient documentation

## 2015-05-03 DIAGNOSIS — G8929 Other chronic pain: Secondary | ICD-10-CM | POA: Insufficient documentation

## 2015-05-03 DIAGNOSIS — Z720 Tobacco use: Secondary | ICD-10-CM | POA: Diagnosis not present

## 2015-05-03 DIAGNOSIS — M25511 Pain in right shoulder: Secondary | ICD-10-CM | POA: Insufficient documentation

## 2015-05-03 DIAGNOSIS — R269 Unspecified abnormalities of gait and mobility: Secondary | ICD-10-CM | POA: Diagnosis not present

## 2015-05-03 DIAGNOSIS — M21961 Unspecified acquired deformity of right lower leg: Secondary | ICD-10-CM | POA: Insufficient documentation

## 2015-05-03 DIAGNOSIS — M5416 Radiculopathy, lumbar region: Secondary | ICD-10-CM | POA: Diagnosis not present

## 2015-05-03 DIAGNOSIS — M545 Low back pain: Secondary | ICD-10-CM | POA: Diagnosis present

## 2015-05-03 LAB — I-STAT CHEM 8, ED
BUN: 26 mg/dL — ABNORMAL HIGH (ref 6–20)
Calcium, Ion: 1.21 mmol/L (ref 1.13–1.30)
Chloride: 110 mmol/L (ref 101–111)
Creatinine, Ser: 1.5 mg/dL — ABNORMAL HIGH (ref 0.44–1.00)
Glucose, Bld: 101 mg/dL — ABNORMAL HIGH (ref 65–99)
HCT: 33 % — ABNORMAL LOW (ref 36.0–46.0)
Hemoglobin: 11.2 g/dL — ABNORMAL LOW (ref 12.0–15.0)
Potassium: 4.8 mmol/L (ref 3.5–5.1)
Sodium: 139 mmol/L (ref 135–145)
TCO2: 17 mmol/L (ref 0–100)

## 2015-05-03 MED ORDER — HYDROCODONE-ACETAMINOPHEN 5-325 MG PO TABS: 1.0000 | ORAL_TABLET | Freq: Four times a day (QID) | ORAL | Status: DC | PRN

## 2015-05-03 MED ORDER — HYDROCODONE-ACETAMINOPHEN 5-325 MG PO TABS
2.0000 | ORAL_TABLET | Freq: Once | ORAL | Status: AC
  Administered 2015-05-03: 2 via ORAL
  Filled 2015-05-03: qty 2

## 2015-05-03 MED ORDER — ONDANSETRON 4 MG PO TBDP
8.0000 mg | ORAL_TABLET | Freq: Once | ORAL | Status: AC
  Administered 2015-05-03: 8 mg via ORAL
  Filled 2015-05-03: qty 2

## 2015-05-03 NOTE — Discharge Instructions (Signed)
Take Tylenol for mild pain or the pain medicine prescribed for bad pain. Don't take Tylenol with the pain medicine prescribed together as accommodation to be dangerous. Stop Aleve and ibuprofen and the class of drugs known as NSAIDS as they can do damage to your kidneys. Call Dr. Pablo Lawrence to arrange to be seen in the office if having significant pain by next week. Return if your condition worsens for any reason

## 2015-05-03 NOTE — ED Notes (Signed)
She states her whole body aches and she has burning pain in BLE for past days. She reports a history of back pain and "spinal problem" but states she is sore all over her entire body now. She denies recent cough, cold, fevers, bowel/bladder changes.  She is A&ox4 , mae

## 2015-05-03 NOTE — ED Provider Notes (Signed)
CSN: [id number]     Arrival date & time 05/03/15  1430 History   First MD Initiated Contact with Patient 05/03/15 1610     Chief Complaint  Patient presents with  . Generalized Body Aches     (Consider location/radiation/quality/duration/timing/severity/associated sxs/prior Treatment) HPI Patient complains of low back pain radiating to both legs onset last night typical of pain that she's had in the past from "a bad disc" she is treated herself with ibuprofen and Aleve, without relief. She also complains of mild bilateral shoulder pain, worse with movement or changing positions proved with remaining still. She denies any chest pain abdominal pain headache denies fever denies loss of bladder or bowel control other associated symptoms. Past Medical History  Diagnosis Date  . Hyperlipidemia   . Hypertension   . Leg pain   . Joint pain   . Headache(784.0)   . Anxiety   . Arthritis   . Peripheral vascular disease    Past Surgical History  Procedure Laterality Date  . Total hip arthroplasty      right  . 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 Joint replacement Right 2012    Hip  . Abdominal angiogram N/A 10/29/2011    Procedure: ABDOMINAL ANGIOGRAM;  Surgeon: Elam Dutch, MD;  Location: [facility] CATH LAB;  Service: Cardiovascular;  Laterality: N/A;   Family History  Problem Relation Age of Onset  . Heart attack Mother   . Heart disease Mother   . Hyperlipidemia Mother   . Hypertension Mother   . Heart attack Father   . Heart disease Father     Before age 51  . Hyperlipidemia Father   . Hypertension Father   . Cancer Sister   . Aneurysm Brother   . Cancer Brother    History  Substance Use Topics  . Smoking status: Current Every Day Smoker -- 0.50 packs/day    Types: Cigarettes    Last Attempt to Quit: 01/27/2014  . Smokeless tobacco: Never Used  . Alcohol Use: No   OB History    No data available     Review of Systems  Constitutional: Negative.   HENT:  Negative.   Respiratory: Negative.   Cardiovascular: Negative.   Gastrointestinal: Negative.   Musculoskeletal: Positive for back pain, arthralgias and gait problem.       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   Neurological: Negative.   Psychiatric/Behavioral: Negative.   All other systems reviewed and are negative.     Allergies  Review of patient's allergies indicates no known allergies.  Home Medications   Prior to Admission medications   Medication Sig Start Date End Date Taking? Authorizing Provider  Amlodipine-Valsartan-HCTZ 10-320-25 MG TABS Take 1 tablet by mouth daily.    Historical Provider, MD  aspirin-acetaminophen-caffeine (EXCEDRIN MIGRAINE) [phone] MG per tablet Take 1 tablet by mouth every 6 (six) hours as needed for headache.    Historical Provider, MD  azithromycin (ZITHROMAX) 250 MG tablet Take 1 tablet (250 mg total) by mouth daily. Take first 2 tablets together, then 1 every day until finished. 12/17/14   Dahlia Bailiff, PA-C  busPIRone (BUSPAR) 15 MG tablet Take 15 mg by mouth 2 (two) times daily as needed. For anxiety.    Historical Provider, MD  diphenhydrAMINE (BENADRYL) 25 MG tablet Take 25 mg by mouth every 6 (six) hours as needed for allergies. 06/21/13  Linton Flemings, MD  meloxicam (MOBIC) 7.5 MG tablet Take 7.5 mg by mouth daily as needed for pain.    Historical Provider, MD  Multiple Vitamins-Calcium (ONE-A-DAY WOMENS PO) Take 1 tablet by mouth daily.    Historical Provider, MD  oxyCODONE-acetaminophen (PERCOCET/ROXICET) 5-325 MG per tablet 1 to 2 tabs PO q6hrs  PRN for pain 02/04/15   Elmyra Ricks Pisciotta, PA-C  rosuvastatin (CRESTOR) 10 MG tablet Take 10 mg by mouth every morning.     Historical Provider, MD  SUMAtriptan (IMITREX) 100 MG tablet Take 100 mg by mouth daily as needed for migraine.     Historical Provider, MD   BP 179/95 mmHg   Pulse 84  Temp(Src) 98 F (36.7 C) (Oral)  Resp 18  Ht 5\' 3"  (1.6 m)  Wt 98 lb (44.453 kg)  BMI 17.36 kg/m2  SpO2 99% Physical Exam  Constitutional: She is oriented to person, place, and time. She appears well-developed and well-nourished. No distress.  HENT:  Head: Normocephalic and atraumatic.  Eyes: Conjunctivae are normal. Pupils are equal, round, and reactive to light.  Neck: Neck supple. No tracheal deviation present. No thyromegaly present.  Cardiovascular: Normal rate and regular rhythm.   No murmur heard. Pulmonary/Chest: Effort normal and breath sounds normal.  Abdominal: Soft. Bowel sounds are normal. She exhibits no distension. There is no tenderness.  Musculoskeletal: Normal range of motion. She exhibits no edema or tenderness.  [employer] spine nontender. She has pain at lumbar area when sitting up from a supine position. All 4 extremities without redness swelling or tenderness. She has deformity of right lower extremity which is chronic. DP pulses 1+ bilaterally. Femoral pulses 2+ bilaterally. Good capillary refill to all toes. Note temperature differential between right lower extremity and left. Bilateral upper extremities without redness swelling or tenderness neurovascularly intact  Neurological: She is alert and oriented to person, place, and time. Coordination normal.  Skin: Skin is warm and dry. No rash noted.  Psychiatric: She has a normal mood and affect.  Nursing note and vitals reviewed.   ED Course  Procedures (including critical care time) Labs Review Labs Reviewed - No data to display  Imaging Review No results found.   EKG Interpretation None     6:45 PM pain improved after treatment with Norco  MDM Final diagnoses:  None    no signs of acute vascular insufficiency. No signs of infection. No red flags for back pain. 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 Bilateral shoulder pain appears musculoskeletal as well.  Plan  prescription Norco. Follow-up Dr. Pablo Lawrence if significant pain by next week. stop NSAIDs Patient has chronic renal insufficiency Diagnoses #1 lumbar radiculopathy #2 arthralgias #3chronic renal insufficiency    Orlie Dakin, MD 05/03/15 1858

## 2015-06-04 ENCOUNTER — Encounter (HOSPITAL_COMMUNITY): Payer: Self-pay | Admitting: Emergency Medicine

## 2015-06-04 ENCOUNTER — Emergency Department (HOSPITAL_COMMUNITY): Payer: Medicare Other

## 2015-06-04 ENCOUNTER — Emergency Department (HOSPITAL_COMMUNITY)
Admission: EM | Admit: 2015-06-04 | Discharge: 2015-06-04 | Disposition: A | Discharge status: Home / Self Care | Payer: Medicare Other | Attending: Emergency Medicine | Admitting: Emergency Medicine

## 2015-06-04 DIAGNOSIS — F419 Anxiety disorder, unspecified: Secondary | ICD-10-CM | POA: Diagnosis not present

## 2015-06-04 DIAGNOSIS — M5440 Lumbago with sciatica, unspecified side: Secondary | ICD-10-CM | POA: Insufficient documentation

## 2015-06-04 DIAGNOSIS — E785 Hyperlipidemia, unspecified: Secondary | ICD-10-CM | POA: Diagnosis not present

## 2015-06-04 DIAGNOSIS — Z79891 Long term (current) use of opiate analgesic: Secondary | ICD-10-CM | POA: Insufficient documentation

## 2015-06-04 DIAGNOSIS — Z720 Tobacco use: Secondary | ICD-10-CM | POA: Diagnosis not present

## 2015-06-04 DIAGNOSIS — M1990 Unspecified osteoarthritis, unspecified site: Secondary | ICD-10-CM | POA: Insufficient documentation

## 2015-06-04 DIAGNOSIS — I10 Essential (primary) hypertension: Secondary | ICD-10-CM | POA: Insufficient documentation

## 2015-06-04 DIAGNOSIS — M545 Low back pain: Secondary | ICD-10-CM | POA: Diagnosis present

## 2015-06-04 DIAGNOSIS — Z79899 Other long term (current) drug therapy: Secondary | ICD-10-CM | POA: Insufficient documentation

## 2015-06-04 MED ORDER — NAPROXEN 375 MG PO TABS
375.0000 mg | ORAL_TABLET | Freq: Two times a day (BID) | ORAL | Status: DC
Start: 2015-06-04 — End: 2015-08-04

## 2015-06-04 MED ORDER — PREDNISONE 10 MG PO TABS: 10.0000 mg | ORAL_TABLET | Freq: Two times a day (BID) | ORAL | Status: DC

## 2015-06-04 MED ORDER — KETOROLAC TROMETHAMINE 60 MG/2ML IM SOLN
60.0000 mg | Freq: Once | INTRAMUSCULAR | Status: AC
  Administered 2015-06-04: 60 mg via INTRAMUSCULAR
  Filled 2015-06-04: qty 2

## 2015-06-04 MED ORDER — ACETAMINOPHEN 500 MG PO TABS
1000.0000 mg | ORAL_TABLET | Freq: Once | ORAL | Status: AC
  Administered 2015-06-04: 1000 mg via ORAL
  Filled 2015-06-04: qty 2

## 2015-06-04 NOTE — Discharge Instructions (Signed)
Medication for pain and inflammation.   Call your primary care doctor tomorrow and get an appointment for early next week. You may need an MRI scan of your lower back which your primary care doctor can coordinate.

## 2015-06-04 NOTE — ED Notes (Signed)
Patient states has had leg pain x 2 weeks, denies injury.   Patient states hip pain started in the middle of the night bilaterally.  Patient denies injury to hips.   Denies any incontinence or urinary symptoms.  History of high blood pressure.  Patient states hasn't taken her bp meds today.

## 2015-06-04 NOTE — ED Notes (Signed)
MD at bedside. 

## 2015-06-05 NOTE — ED Provider Notes (Signed)
CSN: [id number]     Arrival date & time 06/04/15  1209 History   First MD Initiated Contact with Patient 06/04/15 1402     Chief Complaint  Patient presents with  . Leg Pain  . Hip Pain     (Consider location/radiation/quality/duration/timing/severity/associated sxs/prior Treatment) HPI..... Patient complains of lower back pain with radiation to both legs for several days. No bowel or bladder incontinence. No new injury. Symptoms are worsened with ambulation and positioning.  Past Medical History  Diagnosis Date  . Hyperlipidemia   . Hypertension   . Leg pain   . Joint pain   . Headache(784.0)   . Anxiety   . Arthritis   . Peripheral vascular disease    Past Surgical History  Procedure Laterality Date  . Total hip arthroplasty      right  . 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 Joint replacement Right 2012    Hip  . Abdominal angiogram N/A 10/29/2011    Procedure: ABDOMINAL ANGIOGRAM;  Surgeon: Elam Dutch, MD;  Location: [facility] CATH LAB;  Service: Cardiovascular;  Laterality: N/A;   Family History  Problem Relation Age of Onset  . Heart attack Mother   . Heart disease Mother   . Hyperlipidemia Mother   . Hypertension Mother   . Heart attack Father   . Heart disease Father     Before age 81  . Hyperlipidemia Father   . Hypertension Father   . Cancer Sister   . Aneurysm Brother   . Cancer Brother    Social History  Substance Use Topics  . Smoking status: Current Every Day Smoker -- 0.50 packs/day    Types: Cigarettes    Last Attempt to Quit: 01/27/2014  . Smokeless tobacco: Never Used  . Alcohol Use: No   OB History    No data available     Review of Systems  All other systems reviewed and are negative.     Allergies  Review of patient's allergies indicates no known allergies.  Home Medications   Prior to Admission medications   Medication Sig Start Date End Date Taking? Authorizing Provider  Amlodipine-Valsartan-HCTZ 10-320-25 MG TABS  Take 1 tablet by mouth daily.    Historical Provider, MD  aspirin-acetaminophen-caffeine (EXCEDRIN MIGRAINE) [phone] MG per tablet Take 1 tablet by mouth every 6 (six) hours as needed for headache.    Historical Provider, MD  busPIRone (BUSPAR) 15 MG tablet Take 15 mg by mouth 2 (two) times daily as needed. For anxiety.    Historical Provider, MD  citalopram (CELEXA) 40 MG tablet Take 40 mg by mouth daily. 05/07/15   Historical Provider, MD  diphenhydrAMINE (BENADRYL) 25 MG tablet Take 25 mg by mouth every 6 (six) hours as needed for allergies. 06/21/13   Linton Flemings, MD  meloxicam (MOBIC) 7.5 MG tablet Take 7.5 mg by mouth daily as needed for pain.    Historical Provider, MD  Multiple Vitamins-Calcium (ONE-A-DAY WOMENS PO) Take 1 tablet by mouth daily.    Historical Provider, MD  naproxen (NAPROSYN) 375 MG tablet Take 1 tablet (375 mg total) by mouth 2 (two) times daily. 06/04/15   Nat Christen, MD  Oxycodone HCl 10 MG TABS Take 10 mg by mouth every 8 (eight) hours. 05/07/15   Historical Provider, MD  predniSONE (DELTASONE) 10 MG tablet Take 1 tablet (10 mg total) by mouth 2 (two) times daily with a meal. 06/04/15   Nat Christen, MD  rosuvastatin (CRESTOR) 10 MG tablet Take 10 mg  by mouth every morning.     Historical Provider, MD  SUMAtriptan (IMITREX) 100 MG tablet Take 100 mg by mouth daily as needed for migraine.     Historical Provider, MD   BP 175/91 mmHg  Pulse 73  Temp(Src) 98.1 F (36.7 C) (Oral)  Resp 16  Ht 5\' 3"  (1.6 m)  Wt 98 lb (44.453 kg)  BMI 17.36 kg/m2  SpO2 100% Physical Exam  Constitutional: She is oriented to person, place, and time. She appears well-developed and well-nourished.  HENT:  Head: Normocephalic and atraumatic.  Eyes: Conjunctivae and EOM are normal. Pupils are equal, round, and reactive to light.  Neck: Normal range of motion. Neck supple.  Cardiovascular: Normal rate and regular rhythm.   Pulmonary/Chest: Effort normal and breath sounds normal.  Abdominal:  Soft. Bowel sounds are normal.  Musculoskeletal:  Minimal generalized lumbar tenderness. Pain with straight leg raise bilaterally.  Neurological: She is alert and oriented to person, place, and time.  Skin: Skin is warm and dry.  Psychiatric: She has a normal mood and affect. Her behavior is normal.  Nursing note and vitals reviewed.   ED Course  Procedures (including critical care time) Labs Review Labs Reviewed - No data to display  Imaging Review Dg Lumbar Spine Complete  06/04/2015   CLINICAL DATA:  Bilateral lower extremity pain for 1 week  EXAM: LUMBAR SPINE - COMPLETE 4+ VIEW  COMPARISON:  None.  FINDINGS: There is no evidence of lumbar spine fracture. Minimal anterolisthesis of L4 on L5 and L5 on S1 secondary to bilateral facet arthropathy. Disc spaces are relatively well maintained. Generalized osteopenia.  Right total hip arthroplasty.  Abdominal aortic atherosclerosis.  IMPRESSION: 1. Minimal anterolisthesis of L4 on L5 and L5 on S1 secondary to bilateral facet arthropathy.   Electronically Signed   By: Kathreen Devoid   On: 06/04/2015 15:20   I have personally reviewed and evaluated these images and lab results as part of my medical decision-making.   EKG Interpretation None      MDM   Final diagnoses:  Midline low back pain with sciatica, sciatica laterality unspecified    Plain films of lumbar spine show minimal anterolisthesis of L4 and L5 and L5 and S1. Patient will follow-up c primary care doctor. She may need an MRI for further delineation of symptoms.  Discharge medications prednisone and Naprosyn 375 mg    Nat Christen, MD 06/05/15 [phone]

## 2015-06-27 ENCOUNTER — Emergency Department (HOSPITAL_COMMUNITY)
Admission: EM | Admit: 2015-06-27 | Discharge: 2015-06-27 | Disposition: A | Discharge status: Home / Self Care | Payer: Medicare Other | Attending: Emergency Medicine | Admitting: Emergency Medicine

## 2015-06-27 ENCOUNTER — Encounter (HOSPITAL_COMMUNITY): Payer: Self-pay | Admitting: Emergency Medicine

## 2015-06-27 DIAGNOSIS — R42 Dizziness and giddiness: Secondary | ICD-10-CM | POA: Diagnosis not present

## 2015-06-27 DIAGNOSIS — Z720 Tobacco use: Secondary | ICD-10-CM | POA: Diagnosis not present

## 2015-06-27 DIAGNOSIS — R55 Syncope and collapse: Secondary | ICD-10-CM | POA: Insufficient documentation

## 2015-06-27 DIAGNOSIS — M1990 Unspecified osteoarthritis, unspecified site: Secondary | ICD-10-CM | POA: Insufficient documentation

## 2015-06-27 DIAGNOSIS — I10 Essential (primary) hypertension: Secondary | ICD-10-CM | POA: Diagnosis not present

## 2015-06-27 DIAGNOSIS — Z791 Long term (current) use of non-steroidal anti-inflammatories (NSAID): Secondary | ICD-10-CM | POA: Diagnosis not present

## 2015-06-27 DIAGNOSIS — R110 Nausea: Secondary | ICD-10-CM | POA: Diagnosis not present

## 2015-06-27 DIAGNOSIS — Z79899 Other long term (current) drug therapy: Secondary | ICD-10-CM | POA: Diagnosis not present

## 2015-06-27 DIAGNOSIS — E785 Hyperlipidemia, unspecified: Secondary | ICD-10-CM | POA: Diagnosis not present

## 2015-06-27 LAB — CBC WITH DIFFERENTIAL/PLATELET
Basophils Absolute: 0.1 10*3/uL (ref 0.0–0.1)
Basophils Relative: 1 % (ref 0–1)
Eosinophils Absolute: 0.4 10*3/uL (ref 0.0–0.7)
Eosinophils Relative: 4 % (ref 0–5)
HCT: 30.4 % — ABNORMAL LOW (ref 36.0–46.0)
Hemoglobin: 10.1 g/dL — ABNORMAL LOW (ref 12.0–15.0)
Lymphocytes Relative: 27 % (ref 12–46)
Lymphs Abs: 2.4 10*3/uL (ref 0.7–4.0)
MCH: 27.6 pg (ref 26.0–34.0)
MCHC: 33.2 g/dL (ref 30.0–36.0)
MCV: 83.1 fL (ref 78.0–100.0)
Monocytes Absolute: 0.7 10*3/uL (ref 0.1–1.0)
Monocytes Relative: 8 % (ref 3–12)
Neutro Abs: 5.5 10*3/uL (ref 1.7–7.7)
Neutrophils Relative %: 60 % (ref 43–77)
Platelets: 397 10*3/uL (ref 150–400)
RBC: 3.66 MIL/uL — ABNORMAL LOW (ref 3.87–5.11)
RDW: 16.2 % — ABNORMAL HIGH (ref 11.5–15.5)
WBC: 9 10*3/uL (ref 4.0–10.5)

## 2015-06-27 LAB — COMPREHENSIVE METABOLIC PANEL
ALT: 15 U/L (ref 14–54)
AST: 22 U/L (ref 15–41)
Albumin: 3.4 g/dL — ABNORMAL LOW (ref 3.5–5.0)
Alkaline Phosphatase: 115 U/L (ref 38–126)
Anion gap: 9 (ref 5–15)
BUN: 33 mg/dL — ABNORMAL HIGH (ref 6–20)
CO2: 18 mmol/L — ABNORMAL LOW (ref 22–32)
Calcium: 8.8 mg/dL — ABNORMAL LOW (ref 8.9–10.3)
Chloride: 110 mmol/L (ref 101–111)
Creatinine, Ser: 1.74 mg/dL — ABNORMAL HIGH (ref 0.44–1.00)
GFR calc Af Amer: 33 mL/min — ABNORMAL LOW (ref >60)
GFR calc non Af Amer: 28 mL/min — ABNORMAL LOW (ref >60)
Glucose, Bld: 109 mg/dL — ABNORMAL HIGH (ref 65–99)
Potassium: 4.1 mmol/L (ref 3.5–5.1)
Sodium: 137 mmol/L (ref 135–145)
Total Bilirubin: 0.2 mg/dL — ABNORMAL LOW (ref 0.3–1.2)
Total Protein: 6.3 g/dL — ABNORMAL LOW (ref 6.5–8.1)

## 2015-06-27 LAB — PROTIME-INR
INR: 1.11 (ref 0.00–1.49)
Prothrombin Time: 14.5 seconds (ref 11.6–15.2)

## 2015-06-27 LAB — I-STAT TROPONIN, ED: Troponin i, poc: 0 ng/mL (ref 0.00–0.08)

## 2015-06-27 LAB — I-STAT CG4 LACTIC ACID, ED: Lactic Acid, Venous: 1.28 mmol/L (ref 0.5–2.0)

## 2015-06-27 MED ORDER — SODIUM CHLORIDE 0.9 % IV BOLUS (SEPSIS)
1000.0000 mL | Freq: Once | INTRAVENOUS | Status: AC
  Administered 2015-06-27: 1000 mL via INTRAVENOUS

## 2015-06-27 NOTE — ED Provider Notes (Signed)
CSN: [id number]     Arrival date & time 06/27/15  2043 History   First MD Initiated Contact with Patient 06/27/15 2103     Chief Complaint  Patient presents with  . Hypotension     (Consider location/radiation/quality/duration/timing/severity/associated sxs/prior Treatment) Patient is a 74 y.o. female presenting with syncope.  Loss of Consciousness Episode history:  Single Most recent episode:  Today Duration:  1 minute Timing:  Constant Progression:  Unchanged Chronicity:  New Context comment:  Had similar episode in past. today had not had much to eat or drink, was picking up [employer] when felt lightheaded in car then passed out Witnessed: yes   Relieved by:  Nothing Associated symptoms: nausea (after episode)   Associated symptoms: no chest pain, no difficulty breathing, no fever, no headaches, no recent injury, no rectal bleeding, no seizures, no shortness of breath, no vomiting and no weakness   Risk factors: no seizures   Risk factors comment:  PVD   Past Medical History  Diagnosis Date  . Hyperlipidemia   . Hypertension   . Leg pain   . Joint pain   . Headache(784.0)   . Anxiety   . Arthritis   . Peripheral vascular disease    Past Surgical History  Procedure Laterality Date  . Total hip arthroplasty      right  . 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 Joint replacement Right 2012    Hip  . Abdominal angiogram N/A 10/29/2011    Procedure: ABDOMINAL ANGIOGRAM;  Surgeon: Elam Dutch, MD;  Location: [facility] CATH LAB;  Service: Cardiovascular;  Laterality: N/A;   Family History  Problem Relation Age of Onset  . Heart attack Mother   . Heart disease Mother   . Hyperlipidemia Mother   . Hypertension Mother   . Heart attack Father   . Heart disease Father     Before age 12  . Hyperlipidemia Father   . Hypertension Father   . Cancer Sister   . Aneurysm Brother   . Cancer Brother    Social History  Substance Use Topics  . Smoking status: Current Every  Day Smoker -- 0.50 packs/day    Types: Cigarettes    Last Attempt to Quit: 01/27/2014  . Smokeless tobacco: Never Used  . Alcohol Use: No   OB History    No data available     Review of Systems  Constitutional: Negative for fever.  HENT: Negative for sore throat.   Eyes: Negative for visual disturbance.  Respiratory: Negative for cough and shortness of breath.   Cardiovascular: Positive for syncope. Negative for chest pain.  Gastrointestinal: Positive for nausea (after episode). Negative for vomiting and abdominal pain.  Genitourinary: Negative for difficulty urinating.  Musculoskeletal: Negative for back pain and neck pain.  Skin: Negative for rash.  Neurological: Positive for syncope and light-headedness. Negative for tremors, seizures, facial asymmetry, speech difficulty, weakness, numbness and headaches.      Allergies  Review of patient's allergies indicates no known allergies.  Home Medications   Prior to Admission medications   Medication Sig Start Date End Date Taking? Authorizing Provider  Amlodipine-Valsartan-HCTZ 10-320-25 MG TABS Take 1 tablet by mouth daily.   Yes Historical Provider, MD  aspirin-acetaminophen-caffeine (EXCEDRIN MIGRAINE) [phone] MG per tablet Take 1 tablet by mouth every 6 (six) hours as needed for headache.   Yes Historical Provider, MD  Aspirin-Acetaminophen-Caffeine (GOODY HEADACHE PO) Take 1 Package by mouth daily as needed (or headaches).   Yes Historical Provider,  MD  citalopram (CELEXA) 40 MG tablet Take 40 mg by mouth daily. 05/07/15  Yes Historical Provider, MD  ibuprofen (ADVIL,MOTRIN) 200 MG tablet Take 200 mg by mouth every 6 (six) hours as needed for headache.   Yes Historical Provider, MD  Multiple Vitamins-Calcium (ONE-A-DAY WOMENS PO) Take 1 tablet by mouth daily.   Yes Historical Provider, MD  rosuvastatin (CRESTOR) 10 MG tablet Take 10 mg by mouth every morning.    Yes Historical Provider, MD  naproxen (NAPROSYN) 375 MG tablet  Take 1 tablet (375 mg total) by mouth 2 (two) times daily. 06/04/15   Nat Christen, MD  predniSONE (DELTASONE) 10 MG tablet Take 1 tablet (10 mg total) by mouth 2 (two) times daily with a meal. 06/04/15   Nat Christen, MD   BP 154/90 mmHg  Pulse 80  Temp(Src) 97.9 F (36.6 C) (Oral)  Resp 17  Ht 5\' 3"  (1.6 m)  Wt 110 lb (49.896 kg)  BMI 19.49 kg/m2  SpO2 100% Physical Exam  Constitutional: She is oriented to person, place, and time. She appears well-developed and well-nourished. No distress.  HENT:  Head: Normocephalic and atraumatic.  Eyes: Conjunctivae and EOM are normal.  Neck: Normal range of motion.  Cardiovascular: Normal rate, regular rhythm, normal heart sounds and intact distal pulses.  Exam reveals no gallop and no friction rub.   No murmur heard. Pulmonary/Chest: Effort normal and breath sounds normal. No respiratory distress. She has no wheezes. She has no rales.  Abdominal: Soft. She exhibits no distension. There is no tenderness. There is no guarding.  Musculoskeletal: She exhibits no edema or tenderness.  Neurological: She is alert and oriented to person, place, and time. She has normal strength. No cranial nerve deficit or sensory deficit. Coordination normal. GCS eye subscore is 4. GCS verbal subscore is 5. GCS motor subscore is 6.  Skin: Skin is warm and dry. No rash noted. She is not diaphoretic. No erythema.  Nursing note and vitals reviewed.   ED Course  Procedures (including critical care time) Labs Review Labs Reviewed  COMPREHENSIVE METABOLIC PANEL - Abnormal; Notable for the following:    CO2 18 (*)    Glucose, Bld 109 (*)    BUN 33 (*)    Creatinine, Ser 1.74 (*)    Calcium 8.8 (*)    Total Protein 6.3 (*)    Albumin 3.4 (*)    Total Bilirubin 0.2 (*)    GFR calc non Af Amer 28 (*)    GFR calc Af Amer 33 (*)    All other components within normal limits  CBC WITH DIFFERENTIAL/PLATELET - Abnormal; Notable for the following:    RBC 3.66 (*)    Hemoglobin  10.1 (*)    HCT 30.4 (*)    RDW 16.2 (*)    All other components within normal limits  URINE CULTURE  CULTURE, BLOOD (SINGLE)  PROTIME-INR  I-STAT CG4 LACTIC ACID, ED  I-STAT TROPOININ, ED    Imaging Review No results found. I have personally reviewed and evaluated these images and lab results as part of my medical decision-making.   EKG Interpretation   Date/Time:  Friday June 27 2015 21:15:55 EDT Ventricular Rate:  82 PR Interval:  174 QRS Duration: 77 QT Interval:  420 QTC Calculation: 490 R Axis:   72 Text Interpretation:  Sinus rhythm Consider left ventricular hypertrophy  Borderline prolonged QT interval No significant change since last tracing  Confirmed by [facility] MD, Jacqulyne Gladue ([id number]) on 06/27/2015 10:47:11 PM  MDM   Final diagnoses:  Syncope, unspecified syncope type   74 year old female with a history of peripheral vascular disease, hyperlipidemia, hypertension presents with concern of syncope. DDx for syncope includes cardiac arrhythmia, MI, PE, electrolyte abnormality, hypovolemia including dehydration and anemia/GI bleed, infection.  EKG evaluated by me and shows sinus rhythm with no sign of significant prolonged QTc (borderline), no brugada, no sign of HOCM, no ST abnormalities. Patient monitored on telemetry without signs of arrhythmia. Neurologic exam within normal limits and no neurologic symptoms to suggest CVA. When EMS initially picked up the patient her blood pressures were 88 systolic, however they quickly improved with repeat blood pressures with EMS [id number] systolic. In the emergency department patient's blood pressures remained stable, with patient to normal to hypertensive blood pressures.  CBC showed no sign of significant anemia.  CMP showed creatinine at baseline, normal glucose, normal potassium. Patient without chest pain or shortness of breath and have low suspicion for MI or PE as cause of syncope. Lactic acid was within normal limits. Patient  denies any infectious symptoms including no urinary symptoms.  History of patient with little to eat or drink, going out in the heat then feeling lightheaded prior to episode of brief syncope without signs of seizures most consistent with syncope secondary to dehydration or vasovagal episode. Recommended outpatient follow-up and consideration of Holter monitoring given age and risk factors. Patient asymptomatic in the emergency department. Patient discharged in stable condition with understanding of reasons to return.   Gareth Morgan, MD 06/28/15 [phone]

## 2015-06-27 NOTE — ED Notes (Signed)
Patient arrived via [facility]. EMS reports: patient riding with friends, coming back from [facility]. Friends of patient report that patient had period of unresponsiveness, and then vomited. Denies pain. BP upon EMS arrival 88/50. 16 gauze IV placed in left AC. 500 ml NS administered. BP 100/84. CBG 209. VSS.

## 2015-06-27 NOTE — ED Notes (Signed)
Pt. Unable to void at the moment. 

## 2015-06-27 NOTE — ED Notes (Signed)
Discharge instructions discussed with patient/family. Understanding verbalized. No acute distress noted at time of discharge.

## 2015-07-02 LAB — CULTURE, BLOOD (SINGLE): Culture: NO GROWTH

## 2015-07-24 ENCOUNTER — Other Ambulatory Visit: Payer: Self-pay | Admitting: Gastroenterology

## 2015-07-24 DIAGNOSIS — R1013 Epigastric pain: Secondary | ICD-10-CM

## 2015-07-30 ENCOUNTER — Inpatient Hospital Stay (HOSPITAL_COMMUNITY)
Admission: EM | Admit: 2015-07-30 | Discharge: 2015-08-04 | DRG: 460 | Disposition: A | Discharge status: Home Health | Payer: Medicare Other | Attending: Neurosurgery | Admitting: Neurosurgery

## 2015-07-30 ENCOUNTER — Encounter: Payer: Self-pay | Admitting: Family

## 2015-07-30 ENCOUNTER — Ambulatory Visit
Admission: RE | Admit: 2015-07-30 | Discharge: 2015-07-30 | Disposition: A | Discharge status: Home / Self Care | Payer: Medicare Other | Source: Ambulatory Visit | Attending: Gastroenterology | Admitting: Gastroenterology

## 2015-07-30 ENCOUNTER — Encounter (HOSPITAL_COMMUNITY): Payer: Self-pay | Admitting: *Deleted

## 2015-07-30 ENCOUNTER — Emergency Department (HOSPITAL_COMMUNITY): Payer: Medicare Other

## 2015-07-30 DIAGNOSIS — Z48812 Encounter for surgical aftercare following surgery on the circulatory system: Secondary | ICD-10-CM

## 2015-07-30 DIAGNOSIS — E785 Hyperlipidemia, unspecified: Secondary | ICD-10-CM | POA: Diagnosis present

## 2015-07-30 DIAGNOSIS — Z419 Encounter for procedure for purposes other than remedying health state, unspecified: Secondary | ICD-10-CM

## 2015-07-30 DIAGNOSIS — I10 Essential (primary) hypertension: Secondary | ICD-10-CM | POA: Diagnosis present

## 2015-07-30 DIAGNOSIS — I739 Peripheral vascular disease, unspecified: Secondary | ICD-10-CM

## 2015-07-30 DIAGNOSIS — F419 Anxiety disorder, unspecified: Secondary | ICD-10-CM | POA: Diagnosis present

## 2015-07-30 DIAGNOSIS — Z7952 Long term (current) use of systemic steroids: Secondary | ICD-10-CM

## 2015-07-30 DIAGNOSIS — M5416 Radiculopathy, lumbar region: Secondary | ICD-10-CM

## 2015-07-30 DIAGNOSIS — Z96641 Presence of right artificial hip joint: Secondary | ICD-10-CM | POA: Diagnosis present

## 2015-07-30 DIAGNOSIS — Z7982 Long term (current) use of aspirin: Secondary | ICD-10-CM

## 2015-07-30 DIAGNOSIS — M21372 Foot drop, left foot: Secondary | ICD-10-CM

## 2015-07-30 DIAGNOSIS — M4806 Spinal stenosis, lumbar region: Secondary | ICD-10-CM | POA: Diagnosis present

## 2015-07-30 DIAGNOSIS — M4316 Spondylolisthesis, lumbar region: Secondary | ICD-10-CM

## 2015-07-30 DIAGNOSIS — Z79899 Other long term (current) drug therapy: Secondary | ICD-10-CM | POA: Diagnosis not present

## 2015-07-30 DIAGNOSIS — R1013 Epigastric pain: Secondary | ICD-10-CM

## 2015-07-30 DIAGNOSIS — F17210 Nicotine dependence, cigarettes, uncomplicated: Secondary | ICD-10-CM | POA: Diagnosis present

## 2015-07-30 MED ORDER — ONDANSETRON HCL 4 MG/2ML IJ SOLN: 4.0000 mg | INTRAMUSCULAR | Status: DC | PRN

## 2015-07-30 MED ORDER — AMLODIPINE-VALSARTAN-HCTZ 10-320-25 MG PO TABS: 1.0000 | ORAL_TABLET | Freq: Every day | ORAL | Status: DC

## 2015-07-30 MED ORDER — OXYCODONE HCL 5 MG PO TABS
10.0000 mg | ORAL_TABLET | Freq: Every day | ORAL | Status: DC | PRN
  Administered 2015-08-03: 10 mg via ORAL
  Filled 2015-07-30 (×3): qty 2

## 2015-07-30 MED ORDER — SODIUM CHLORIDE 0.9 % IJ SOLN: 3.0000 mL | INTRAMUSCULAR | Status: DC | PRN

## 2015-07-30 MED ORDER — ONDANSETRON 4 MG PO TBDP
8.0000 mg | ORAL_TABLET | Freq: Once | ORAL | Status: AC
  Administered 2015-07-30: 8 mg via ORAL
  Filled 2015-07-30: qty 2

## 2015-07-30 MED ORDER — PHENOL 1.4 % MT LIQD: 1.0000 | OROMUCOSAL | Status: DC | PRN

## 2015-07-30 MED ORDER — OXYCODONE-ACETAMINOPHEN 5-325 MG PO TABS
1.0000 | ORAL_TABLET | ORAL | Status: DC | PRN
  Administered 2015-07-31 – 2015-08-04 (×9): 2 via ORAL
  Filled 2015-07-30 (×9): qty 2

## 2015-07-30 MED ORDER — TRAZODONE HCL 50 MG PO TABS
50.0000 mg | ORAL_TABLET | Freq: Every day | ORAL | Status: DC
  Administered 2015-07-30 – 2015-08-03 (×5): 50 mg via ORAL
  Filled 2015-07-30 (×5): qty 1

## 2015-07-30 MED ORDER — ROSUVASTATIN CALCIUM 10 MG PO TABS
10.0000 mg | ORAL_TABLET | Freq: Every morning | ORAL | Status: DC
  Administered 2015-07-31 – 2015-08-04 (×4): 10 mg via ORAL
  Filled 2015-07-30 (×5): qty 1

## 2015-07-30 MED ORDER — PANTOPRAZOLE SODIUM 40 MG PO TBEC
40.0000 mg | DELAYED_RELEASE_TABLET | Freq: Two times a day (BID) | ORAL | Status: DC
  Administered 2015-07-30 – 2015-08-04 (×9): 40 mg via ORAL
  Filled 2015-07-30 (×9): qty 1

## 2015-07-30 MED ORDER — CITALOPRAM HYDROBROMIDE 40 MG PO TABS
40.0000 mg | ORAL_TABLET | Freq: Every day | ORAL | Status: DC
  Administered 2015-07-30 – 2015-08-04 (×5): 40 mg via ORAL
  Filled 2015-07-30 (×6): qty 1

## 2015-07-30 MED ORDER — POTASSIUM CHLORIDE IN NACL 20-0.9 MEQ/L-% IV SOLN
INTRAVENOUS | Status: DC
  Administered 2015-07-30: 20:00:00 via INTRAVENOUS
  Administered 2015-07-31: 1000 mL via INTRAVENOUS
  Filled 2015-07-30 (×2): qty 1000

## 2015-07-30 MED ORDER — SODIUM CHLORIDE 0.9 % IJ SOLN
3.0000 mL | Freq: Two times a day (BID) | INTRAMUSCULAR | Status: DC
  Administered 2015-07-31: 10 mL via INTRAVENOUS
  Administered 2015-08-01 – 2015-08-03 (×4): 3 mL via INTRAVENOUS

## 2015-07-30 MED ORDER — MENTHOL 3 MG MT LOZG: 1.0000 | LOZENGE | OROMUCOSAL | Status: DC | PRN

## 2015-07-30 MED ORDER — FERROUS SULFATE 325 (65 FE) MG PO TABS
325.0000 mg | ORAL_TABLET | Freq: Every day | ORAL | Status: DC
Start: 2015-07-31 — End: 2015-08-04
  Administered 2015-07-31 – 2015-08-04 (×4): 325 mg via ORAL
  Filled 2015-07-30 (×4): qty 1

## 2015-07-30 MED ORDER — IRBESARTAN 300 MG PO TABS
300.0000 mg | ORAL_TABLET | Freq: Every day | ORAL | Status: DC
  Administered 2015-07-30 – 2015-08-04 (×5): 300 mg via ORAL
  Filled 2015-07-30 (×5): qty 1

## 2015-07-30 MED ORDER — AMLODIPINE BESYLATE 10 MG PO TABS
10.0000 mg | ORAL_TABLET | Freq: Every day | ORAL | Status: DC
  Administered 2015-07-30 – 2015-08-04 (×6): 10 mg via ORAL
  Filled 2015-07-30 (×6): qty 1

## 2015-07-30 MED ORDER — SODIUM CHLORIDE 0.9 % IV SOLN: 250.0000 mL | INTRAVENOUS | Status: DC

## 2015-07-30 MED ORDER — HYDROMORPHONE HCL 1 MG/ML IJ SOLN
0.5000 mg | Freq: Once | INTRAMUSCULAR | Status: AC
  Administered 2015-07-30: 0.5 mg via INTRAMUSCULAR
  Filled 2015-07-30: qty 1

## 2015-07-30 MED ORDER — HYDROCHLOROTHIAZIDE 25 MG PO TABS
25.0000 mg | ORAL_TABLET | Freq: Every day | ORAL | Status: DC
  Administered 2015-07-30 – 2015-08-04 (×6): 25 mg via ORAL
  Filled 2015-07-30 (×6): qty 1

## 2015-07-30 MED ORDER — DEXAMETHASONE SODIUM PHOSPHATE 4 MG/ML IJ SOLN
4.0000 mg | Freq: Four times a day (QID) | INTRAMUSCULAR | Status: DC
  Administered 2015-07-30 – 2015-08-04 (×19): 4 mg via INTRAVENOUS
  Filled 2015-07-30 (×19): qty 1

## 2015-07-30 MED ORDER — DOCUSATE SODIUM 100 MG PO CAPS
100.0000 mg | ORAL_CAPSULE | Freq: Two times a day (BID) | ORAL | Status: DC
  Administered 2015-07-30 – 2015-08-04 (×9): 100 mg via ORAL
  Filled 2015-07-30 (×8): qty 1

## 2015-07-30 MED ORDER — ACETAMINOPHEN 325 MG PO TABS: 650.0000 mg | ORAL_TABLET | ORAL | Status: DC | PRN

## 2015-07-30 MED ORDER — HYDROMORPHONE HCL 1 MG/ML IJ SOLN
0.5000 mg | INTRAMUSCULAR | Status: DC | PRN
  Administered 2015-07-30 – 2015-08-04 (×12): 1 mg via INTRAVENOUS
  Filled 2015-07-30 (×12): qty 1

## 2015-07-30 MED ORDER — ACETAMINOPHEN 650 MG RE SUPP: 650.0000 mg | RECTAL | Status: DC | PRN

## 2015-07-30 NOTE — ED Provider Notes (Signed)
CSN: [id number]     Arrival date & time 07/30/15  [id number] History   First MD Initiated Contact with Patient 07/30/15 1033     Chief Complaint  Patient presents with  . Back Pain     (Consider location/radiation/quality/duration/timing/severity/associated sxs/prior Treatment) The history is provided by the patient.   Alyssa [name] is a 74 y.o. female with a past medical history listed below presenting for evaluation of worsening lower back pain and new weakness in her left foot.  She reports having chronic pain in her lower back who has been followed by a local urgent care center.  An MRI completed July 2015 of her lumbar spine indicates advanced lumbar disease, most notable for severe spinal stenosis at the L4-L5/S1 level.  She has increased pain since running out of her oxycodone one week ago, but is also concerned about new weakness in her left foot, stating she cannot pick her left foot up for the past 3 days.  She denies urinary or bowel incontinence.  Pain is worsened with movement.  She currently uses a walker and/or a cane and denies any falls recently.  She has taken bc powders and tylenol without pain relief.    Past Medical History  Diagnosis Date  . Hyperlipidemia   . Hypertension   . Leg pain   . Joint pain   . Headache(784.0)   . Anxiety   . Arthritis   . Peripheral vascular disease [facility])    Past Surgical History  Procedure Laterality Date  . Total hip arthroplasty      right  . 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 Joint replacement Right 2012    Hip  . Abdominal angiogram N/A 10/29/2011    Procedure: ABDOMINAL ANGIOGRAM;  Surgeon: Elam Dutch, MD;  Location: [facility] CATH LAB;  Service: Cardiovascular;  Laterality: N/A;   Family History  Problem Relation Age of Onset  . Heart attack Mother   . Heart disease Mother   . Hyperlipidemia Mother   . Hypertension Mother   . Heart attack Father   . Heart disease Father     Before age 13  . Hyperlipidemia  Father   . Hypertension Father   . Cancer Sister   . Aneurysm Brother   . Cancer Brother    Social History  Substance Use Topics  . Smoking status: Current Every Day Smoker -- 0.50 packs/day    Types: Cigarettes    Last Attempt to Quit: 01/27/2014  . Smokeless tobacco: Never Used  . Alcohol Use: No   OB History    No data available     Review of Systems  Constitutional: Negative for fever.  Respiratory: Negative for shortness of breath.   Cardiovascular: Negative for chest pain and leg swelling.  Gastrointestinal: Negative for abdominal pain, constipation and abdominal distention.  Genitourinary: Negative for dysuria, urgency, frequency, flank pain and difficulty urinating.  Musculoskeletal: Positive for back pain. Negative for joint swelling and gait problem.  Skin: Negative for rash.  Neurological: Negative for weakness and numbness.      Allergies  Review of patient's allergies indicates no known allergies.  Home Medications   Prior to Admission medications   Medication Sig Start Date End Date Taking? Authorizing Provider  Amlodipine-Valsartan-HCTZ 10-320-25 MG TABS Take 1 tablet by mouth daily.   Yes Historical Provider, MD  aspirin-acetaminophen-caffeine (EXCEDRIN MIGRAINE) [phone] MG per tablet Take 1 tablet by mouth every 6 (six) hours as needed for headache.   Yes Historical Provider,  MD  Aspirin-Acetaminophen-Caffeine (GOODY HEADACHE PO) Take 1 Package by mouth daily as needed (or headaches).   Yes Historical Provider, MD  citalopram (CELEXA) 40 MG tablet Take 40 mg by mouth daily. 05/07/15  Yes Historical Provider, MD  ferrous sulfate 325 (65 FE) MG tablet Take 325 mg by mouth daily with breakfast.   Yes Historical Provider, MD  Multiple Vitamins-Calcium (ONE-A-DAY WOMENS PO) Take 1 tablet by mouth daily.   Yes Historical Provider, MD  Oxycodone HCl 10 MG TABS Take 10 mg by mouth daily as needed.   Yes Historical Provider, MD  rosuvastatin (CRESTOR) 10 MG  tablet Take 10 mg by mouth every morning.    Yes Historical Provider, MD  traZODone (DESYREL) 50 MG tablet Take 50 mg by mouth at bedtime.   Yes Historical Provider, MD  ibuprofen (ADVIL,MOTRIN) 200 MG tablet Take 200 mg by mouth every 6 (six) hours as needed for headache.    Historical Provider, MD  naproxen (NAPROSYN) 375 MG tablet Take 1 tablet (375 mg total) by mouth 2 (two) times daily. Patient not taking: Reported on 07/30/2015 06/04/15   Nat Christen, MD  predniSONE (DELTASONE) 10 MG tablet Take 1 tablet (10 mg total) by mouth 2 (two) times daily with a meal. Patient not taking: Reported on 07/30/2015 06/04/15   Nat Christen, MD   BP 168/91 mmHg  Pulse 78  Temp(Src) 98.2 F (36.8 C) (Oral)  Resp 18  Ht 5\' 3"  (1.6 m)  Wt 93 lb (42.185 kg)  BMI 16.48 kg/m2  SpO2 98% Physical Exam  Constitutional: She appears well-developed and well-nourished.  HENT:  Head: Normocephalic.  Eyes: Conjunctivae are normal.  Neck: Normal range of motion. Neck supple.  Cardiovascular: Normal rate and intact distal pulses.   Pedal pulses normal.  Pulmonary/Chest: Effort normal.  Abdominal: Soft. Bowel sounds are normal. She exhibits no distension and no mass.  Musculoskeletal: Normal range of motion. She exhibits no edema.       Lumbar back: She exhibits tenderness. She exhibits no swelling, no edema and no spasm.  Neurological: She is alert. She has normal strength. She displays no atrophy and no tremor. A sensory deficit is present. No cranial nerve deficit. Gait normal.  Reflex Scores:      Patellar reflexes are 0 on the right side and 0 on the left side.      Achilles reflexes are 1+ on the right side and 1+ on the left side. No strength deficit noted in hip and knee flexor and extensor muscle groups.  Left ankle plantar flexion intact, unable to dorsiflex left, right normal. No clonus.  Unable to elicit patellar dtr's.  Babinski negative.  Skin: Skin is warm and dry.  Psychiatric: She has a normal  mood and affect.  Nursing note and vitals reviewed.   ED Course  Procedures (including critical care time) Labs Review Labs Reviewed - No data to display  Imaging Review Results for orders placed or performed during the hospital encounter of 06/27/15  Culture, blood (single)  Result Value Ref Range   Specimen Description BLOOD RIGHT ARM    Special Requests BOTTLES DRAWN AEROBIC AND ANAEROBIC 5CC    Culture NO GROWTH 5 DAYS    Report Status 07/02/2015 FINAL   Comprehensive metabolic panel  Result Value Ref Range   Sodium 137 135 - 145 mmol/L   Potassium 4.1 3.5 - 5.1 mmol/L   Chloride 110 101 - 111 mmol/L   CO2 18 (L) 22 - 32 mmol/L  Glucose, Bld 109 (H) 65 - 99 mg/dL   BUN 33 (H) 6 - 20 mg/dL   Creatinine, Ser 1.74 (H) 0.44 - 1.00 mg/dL   Calcium 8.8 (L) 8.9 - 10.3 mg/dL   Total Protein 6.3 (L) 6.5 - 8.1 g/dL   Albumin 3.4 (L) 3.5 - 5.0 g/dL   AST 22 15 - 41 U/L   ALT 15 14 - 54 U/L   Alkaline Phosphatase 115 38 - 126 U/L   Total Bilirubin 0.2 (L) 0.3 - 1.2 mg/dL   GFR calc non Af Amer 28 (L) >60 mL/min   GFR calc Af Amer 33 (L) >60 mL/min   Anion gap 9 5 - 15  CBC WITH DIFFERENTIAL  Result Value Ref Range   WBC 9.0 4.0 - 10.5 K/uL   RBC 3.66 (L) 3.87 - 5.11 MIL/uL   Hemoglobin 10.1 (L) 12.0 - 15.0 g/dL   HCT 30.4 (L) 36.0 - 46.0 %   MCV 83.1 78.0 - 100.0 fL   MCH 27.6 26.0 - 34.0 pg   MCHC 33.2 30.0 - 36.0 g/dL   RDW 16.2 (H) 11.5 - 15.5 %   Platelets 397 150 - 400 K/uL   Neutrophils Relative % 60 43 - 77 %   Neutro Abs 5.5 1.7 - 7.7 K/uL   Lymphocytes Relative 27 12 - 46 %   Lymphs Abs 2.4 0.7 - 4.0 K/uL   Monocytes Relative 8 3 - 12 %   Monocytes Absolute 0.7 0.1 - 1.0 K/uL   Eosinophils Relative 4 0 - 5 %   Eosinophils Absolute 0.4 0.0 - 0.7 K/uL   Basophils Relative 1 0 - 1 %   Basophils Absolute 0.1 0.0 - 0.1 K/uL  Protime-INR  Result Value Ref Range   Prothrombin Time 14.5 11.6 - 15.2 seconds   INR 1.11 0.00 - 1.49  I-Stat CG4 Lactic Acid, ED  (not  at  [facility])  Result Value Ref Range   Lactic Acid, Venous 1.28 0.5 - 2.0 mmol/L  I-stat troponin, ED (not at [facility])  Result Value Ref Range   Troponin i, poc 0.00 0.00 - 0.08 ng/mL   Comment 3           Mr Lumbar Spine Wo Contrast  07/30/2015  CLINICAL DATA:  Low back pain extending into the legs bilaterally. Progression a pain over the last 2 days. EXAM: MRI LUMBAR SPINE WITHOUT CONTRAST TECHNIQUE: Multiplanar, multisequence MR imaging of the lumbar spine was performed. No intravenous contrast was administered. COMPARISON:  MRI lumbar spine 04/18/2014 FINDINGS: Normal signal is present in the conus medullaris which terminates at L1-2. Marrow signal and vertebral body heights are maintained. Grade 1 anterolisthesis at L4-5 has progressed. Sub cm cysts are present in both kidneys. Limited imaging the abdomen is otherwise unremarkable. L1-2:  Negative. L2-3:  Negative. L3-4: Lateral disc protrusions are more prominent left than right. Mild left foraminal narrowing is stable. L4-5: Moderate to severe facet hypertrophy is again noted. There is uncovering of a broad-based disc protrusion. Progressive moderate subarticular narrowing is evident bilaterally. Severe right and moderate left foraminal stenosis have progressed. L5-S1: A broad-based disc protrusion is present. Mild to moderate subarticular stenosis is again seen bilaterally. Mild to moderate foraminal narrowing is slightly worse than on the prior study, right greater than left. IMPRESSION: 1. Progressive severe right and moderate left foraminal stenosis at L4-5. 2. Progressive moderate subarticular narrowing bilaterally at L4-5. 3. Mild to moderate foraminal and subarticular stenosis at L5-S1 has progressed  slightly as well, worse on the right. 4. Lateral disc protrusions at L3-4 with stable mild left foraminal stenosis. Electronically Signed   By: San Morelle M.D.   On: 07/30/2015 17:02    I have personally reviewed and evaluated these  images and lab results as part of my medical decision-making.   EKG Interpretation None      MDM   Final diagnoses:  Foot drop, left  Lumbar radiculopathy, acute    Pt with chronic lumbar pain with known degenerative changes per MRI obtained 14 months ago.  Left foot drop and weakness for the past 3 days.  Scheduled to see neurosurgery as outpatient tomorrow.  Attempted to speak with Simeon Craft, PA-C whom she is scheduled to see in the am.  Advised to work up here including MRI and call with results.  Concern for possible emergent presentation vs safety of waiting for outpatient eval as scheduled.  Pt seen by Dr. Saintclair Halsted who will admit pt in anticipation of surgical intervention.    Evalee Jefferson, PA-C 07/30/15 1751  Davonna Belling, MD 08/02/15 [phone]

## 2015-07-30 NOTE — ED Notes (Signed)
MRI reports they will call this RN when the pt is next to come over for testing.

## 2015-07-30 NOTE — ED Notes (Signed)
C/o lower back pain with radiation into her legs states she has an appointment with her MD tomorrow with her MD however the pain has gotten worse the past 2 days.

## 2015-07-30 NOTE — ED Notes (Signed)
Patient currently in MRI.

## 2015-07-30 NOTE — Progress Notes (Signed)
Pt admitted at 1900 with c/o of left leg pain, pt already settled in bed, call light at bedside, v/s stable, will however continue to monitor. [name], [name]

## 2015-07-30 NOTE — H&P (Signed)
Alyssa [name] is an 74 y.o. female.   Chief Complaint: Back left leg pain  HPI: Patient is very pleasant 74 year old female is a long-standing issues of chronic back pain is been working with Dr. Maryjean Ka and his PA and pain management with injections as recently as a month ago. Over the last few weeks she's had progressive worsening left leg pain and over 2 weeks ago she developed weakness in her left foot where she has been unable to move it. She says this has not changed in last weeks it has not gotten worse in the last couple days. The pain is worse but the weakness in the foot is been stable. Any bowel bladder symptoms denies any right leg pain.  Past Medical History  Diagnosis Date  . Hyperlipidemia   . Hypertension   . Leg pain   . Joint pain   . Headache(784.0)   . Anxiety   . Arthritis   . Peripheral vascular disease [facility])     Past Surgical History  Procedure Laterality Date  . Total hip arthroplasty      right  . 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 Joint replacement Right 2012    Hip  . Abdominal angiogram N/A 10/29/2011    Procedure: ABDOMINAL ANGIOGRAM;  Surgeon: Elam Dutch, MD;  Location: [facility] CATH LAB;  Service: Cardiovascular;  Laterality: N/A;    Family History  Problem Relation Age of Onset  . Heart attack Mother   . Heart disease Mother   . Hyperlipidemia Mother   . Hypertension Mother   . Heart attack Father   . Heart disease Father     Before age 69  . Hyperlipidemia Father   . Hypertension Father   . Cancer Sister   . Aneurysm Brother   . Cancer Brother    Social History:  reports that she has been smoking Cigarettes.  She has been smoking about 0.50 packs per day. She has never used smokeless tobacco. She reports that she does not drink alcohol or use illicit drugs.  Allergies: No Known Allergies   (Not in a hospital admission)  No results found for this or any previous visit (from the past 48 hour(s)). Mr Lumbar Spine Wo  Contrast  07/30/2015  CLINICAL DATA:  Low back pain extending into the legs bilaterally. Progression a pain over the last 2 days. EXAM: MRI LUMBAR SPINE WITHOUT CONTRAST TECHNIQUE: Multiplanar, multisequence MR imaging of the lumbar spine was performed. No intravenous contrast was administered. COMPARISON:  MRI lumbar spine 04/18/2014 FINDINGS: Normal signal is present in the conus medullaris which terminates at L1-2. Marrow signal and vertebral body heights are maintained. Grade 1 anterolisthesis at L4-5 has progressed. Sub cm cysts are present in both kidneys. Limited imaging the abdomen is otherwise unremarkable. L1-2:  Negative. L2-3:  Negative. L3-4: Lateral disc protrusions are more prominent left than right. Mild left foraminal narrowing is stable. L4-5: Moderate to severe facet hypertrophy is again noted. There is uncovering of a broad-based disc protrusion. Progressive moderate subarticular narrowing is evident bilaterally. Severe right and moderate left foraminal stenosis have progressed. L5-S1: A broad-based disc protrusion is present. Mild to moderate subarticular stenosis is again seen bilaterally. Mild to moderate foraminal narrowing is slightly worse than on the prior study, right greater than left. IMPRESSION: 1. Progressive severe right and moderate left foraminal stenosis at L4-5. 2. Progressive moderate subarticular narrowing bilaterally at L4-5. 3. Mild to moderate foraminal and subarticular stenosis at L5-S1 has progressed slightly as  well, worse on the right. 4. Lateral disc protrusions at L3-4 with stable mild left foraminal stenosis. Electronically Signed   By: San Morelle M.D.   On: 07/30/2015 17:02   US Abdomen Complete  07/30/2015  ADDENDUM REPORT: 07/30/2015 13:21 ADDENDUM: In the impression of the report I misspoke. I should have stated that "the hepatic echotexture of the liver is normal". Electronically Signed   By: David  Martinique M.D.   On: 07/30/2015 13:21  07/30/2015   CLINICAL DATA:  Postprandial mid abdominal pain for the past 6 months, no history of nausea vomiting or diarrhea. EXAM: ULTRASOUND ABDOMEN COMPLETE COMPARISON:  Abdominal and pelvic CT scan dated November 18, 2008 FINDINGS: Gallbladder: No gallstones or wall thickening visualized. No sonographic Murphy sign noted. Common bile duct: Diameter: 6.9-9.4 cm. No intraluminal stones are observed. Liver: The hepatic echotexture is normal. There is no focal mass or ductal dilation. The surface contour is normal. IVC: No abnormality visualized. Pancreas: Bowel gas limits evaluation of the pancreatic tail. The pancreatic head and body are within the limits of normal. Spleen: 2.2 mm Right Kidney: Length: 6.6 cm. This atrophy has been previously reported. The right renal cortical echotexture is diffusely increased and is greater than that of the liver. There are two subcentimeter hypoechoic to anechoic foci laterally measuring up to 7 cm compatible with cysts. Left Kidney: Length: 10.5 cm. The left renal cortical echotexture is mildly increased and is similar to that of the liver. There is a lateral lower pole cyst measuring 7 mm in diameter. There is a additional lower pole cyst measuring approximately 8 mm in diameter. There is no hydronephrosis. Abdominal aorta: No aneurysm visualized. The iliac arteries were obscured by bowel gas. Other findings: None. IMPRESSION: 1. Fatty infiltrative change of the liver. No acute gallbladder abnormality. Limited visualization of the pancreatic tail. The spleen is normal. 2. Right renal atrophy and increased cortical echotexture with tiny cysts. Mildly increased renal cortical echotexture on the left with subcentimeter lower pole cysts. There is no hydronephrosis. The increased renal cortical echotexture likely reflects medical renal disease. Electronically Signed: By: David  Martinique M.D. On: 07/30/2015 10:10    Review of Systems  Constitutional: Negative.   HENT: Negative.   Eyes:  Negative.   Respiratory: Negative.   Cardiovascular: Negative.   Genitourinary: Negative.   Musculoskeletal: Positive for myalgias, back pain and joint pain.  Skin: Negative.   Neurological: Positive for tingling and sensory change.  Psychiatric/Behavioral: Negative.     Blood pressure 168/91, pulse 78, temperature 98.2 F (36.8 C), temperature source Oral, resp. rate 18, height 5\' 3"  (1.6 m), weight 42.185 kg (93 lb), SpO2 98 %. Physical Exam  Constitutional: She is oriented to person, place, and time. She appears well-developed and well-nourished.  HENT:  Head: Normocephalic.  Eyes: Pupils are equal, round, and reactive to light.  Neck: Normal range of motion.  Respiratory: Effort normal.  GI: Soft.  Musculoskeletal: Normal range of motion.  Neurological: She is alert and oriented to person, place, and time.  Strength in the right lower extremity is 5 out of 5 in her iliopsoas, quads, hip she's, gastrocs, anterior tibialis, EHL. Strength in the left lower extremity is consistent with a complete left footdrop otherwise 5 out of 5 iliopsoas quads hamstrings and plantar flexion  Skin: Skin is warm and dry.     Assessment/Plan  74 year old female with a left foot drop grade 1 spondylolisthesis severe spinal stenosis and herniated disks on the right and left foraminally  at L4-5. Patient apparently has had a foot drop now going on for 2 weeks I counseled the patient that due to her progression of her clinical syndrome and needed to stabilize her spine but the chances of recovering the foot drop or very low. I do think we need to operate on her lumbar spine in the next 48-72 hours. But due to the fact that his footdrop is been there for 2 weeks I do not that we have to do it emergently. I extensively explained THE patient she does not want to proceed forward with surgery until she had a chance to talk with her family she has elected to be admitted to the hospital will place her on Decadron for  pain management physical therapy and plan on scheduling surgery in the next 24-48 hours.  Jarelle Ates P 07/30/2015, 5:41 PM

## 2015-07-31 ENCOUNTER — Ambulatory Visit: Payer: Medicare Other | Admitting: Family

## 2015-07-31 ENCOUNTER — Encounter (HOSPITAL_COMMUNITY): Payer: Medicare Other

## 2015-07-31 LAB — CBC WITH DIFFERENTIAL/PLATELET
Basophils Absolute: 0 10*3/uL (ref 0.0–0.1)
Basophils Relative: 0 %
Eosinophils Absolute: 0 10*3/uL (ref 0.0–0.7)
Eosinophils Relative: 0 %
HCT: 28.4 % — ABNORMAL LOW (ref 36.0–46.0)
Hemoglobin: 9.4 g/dL — ABNORMAL LOW (ref 12.0–15.0)
Lymphocytes Relative: 16 %
Lymphs Abs: 1.4 10*3/uL (ref 0.7–4.0)
MCH: 27.5 pg (ref 26.0–34.0)
MCHC: 33.1 g/dL (ref 30.0–36.0)
MCV: 83 fL (ref 78.0–100.0)
Monocytes Absolute: 0.3 10*3/uL (ref 0.1–1.0)
Monocytes Relative: 3 %
Neutro Abs: 7.2 10*3/uL (ref 1.7–7.7)
Neutrophils Relative %: 81 %
Platelets: 378 10*3/uL (ref 150–400)
RBC: 3.42 MIL/uL — ABNORMAL LOW (ref 3.87–5.11)
RDW: 16.6 % — ABNORMAL HIGH (ref 11.5–15.5)
WBC: 8.9 10*3/uL (ref 4.0–10.5)

## 2015-07-31 LAB — BASIC METABOLIC PANEL
Anion gap: 8 (ref 5–15)
BUN: 32 mg/dL — ABNORMAL HIGH (ref 6–20)
CO2: 13 mmol/L — ABNORMAL LOW (ref 22–32)
Calcium: 8.7 mg/dL — ABNORMAL LOW (ref 8.9–10.3)
Chloride: 114 mmol/L — ABNORMAL HIGH (ref 101–111)
Creatinine, Ser: 1.77 mg/dL — ABNORMAL HIGH (ref 0.44–1.00)
GFR calc Af Amer: 32 mL/min — ABNORMAL LOW (ref >60)
GFR calc non Af Amer: 28 mL/min — ABNORMAL LOW (ref >60)
Glucose, Bld: 215 mg/dL — ABNORMAL HIGH (ref 65–99)
Potassium: 5.5 mmol/L — ABNORMAL HIGH (ref 3.5–5.1)
Sodium: 135 mmol/L (ref 135–145)

## 2015-07-31 LAB — PROTIME-INR
INR: 1.1 (ref 0.00–1.49)
Prothrombin Time: 14.4 seconds (ref 11.6–15.2)

## 2015-07-31 LAB — TYPE AND SCREEN
ABO/RH(D): O POS
Antibody Screen: NEGATIVE

## 2015-07-31 NOTE — Accreditation Note (Addendum)
CSW Consult Acknowledged:   CSW received a consult for SNF placement. CSW awaiting PT/OT evaluation to determine the appropriate level of care.      Addendum: CSW reviewed PT/OT's current evaluations. CSW will continue to follow the pt post surgery.    [place], MSW, [place]
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
## 2015-07-31 NOTE — Progress Notes (Signed)
Patient ID: Alyssa [name], female   DOB: Dec 06, 1940, 74 y.o.   MRN: [id number] Patient feels much better this morning on steroids less pain increased movement left foot.  Neurologic stable except increased dorsiflexion at 2-3 out of 54  74 year old female presented with a 2 week history of complete left foot drop Akin leg pain will plan decompression fusion L4-5 tomorrow.
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
## 2015-08-01 ENCOUNTER — Inpatient Hospital Stay (HOSPITAL_COMMUNITY): Payer: Medicare Other | Admitting: Certified Registered"

## 2015-08-01 ENCOUNTER — Inpatient Hospital Stay (HOSPITAL_COMMUNITY): Payer: Medicare Other

## 2015-08-01 ENCOUNTER — Encounter (HOSPITAL_COMMUNITY): Payer: Self-pay | Admitting: Anesthesiology

## 2015-08-01 ENCOUNTER — Encounter (HOSPITAL_COMMUNITY)
Admission: EM | Disposition: A | Discharge status: Home Health | Payer: Self-pay | Source: Home / Self Care | Attending: Neurosurgery

## 2015-08-01 LAB — POCT I-STAT 4, (NA,K, GLUC, HGB,HCT)
Glucose, Bld: 72 mg/dL (ref 65–99)
Glucose, Bld: 116 mg/dL — ABNORMAL HIGH (ref 65–99)
HCT: 14 % — ABNORMAL LOW (ref 36.0–46.0)
HCT: 29 % — ABNORMAL LOW (ref 36.0–46.0)
Hemoglobin: 4.8 g/dL — CL (ref 12.0–15.0)
Hemoglobin: 9.9 g/dL — ABNORMAL LOW (ref 12.0–15.0)
Potassium: 4.8 mmol/L (ref 3.5–5.1)
Potassium: 5.2 mmol/L — ABNORMAL HIGH (ref 3.5–5.1)
Sodium: 136 mmol/L (ref 135–145)
Sodium: 138 mmol/L (ref 135–145)

## 2015-08-01 LAB — SURGICAL PCR SCREEN
MRSA, PCR: NEGATIVE
Staphylococcus aureus: NEGATIVE

## 2015-08-01 SURGERY — POSTERIOR LUMBAR FUSION 1 LEVEL
Anesthesia: General | Site: Back

## 2015-08-01 MED ORDER — SODIUM CHLORIDE 0.9 % IV SOLN: 250.0000 mL | INTRAVENOUS | Status: DC

## 2015-08-01 MED ORDER — HYDROMORPHONE HCL 1 MG/ML IJ SOLN: 0.5000 mg | INTRAMUSCULAR | Status: DC | PRN

## 2015-08-01 MED ORDER — SODIUM CHLORIDE 0.9 % IV SOLN
INTRAVENOUS | Status: AC
  Administered 2015-08-01: 04:00:00 via INTRAVENOUS

## 2015-08-01 MED ORDER — MENTHOL 3 MG MT LOZG: 1.0000 | LOZENGE | OROMUCOSAL | Status: DC | PRN

## 2015-08-01 MED ORDER — ONDANSETRON HCL 4 MG/2ML IJ SOLN
INTRAMUSCULAR | Status: DC | PRN
  Administered 2015-08-01: 4 mg via INTRAVENOUS

## 2015-08-01 MED ORDER — SODIUM CHLORIDE 0.9 % IJ SOLN
3.0000 mL | Freq: Two times a day (BID) | INTRAMUSCULAR | Status: DC
  Administered 2015-08-01 – 2015-08-04 (×6): 3 mL via INTRAVENOUS

## 2015-08-01 MED ORDER — CYCLOBENZAPRINE HCL 10 MG PO TABS
10.0000 mg | ORAL_TABLET | Freq: Three times a day (TID) | ORAL | Status: DC | PRN
Start: 2015-08-01 — End: 2015-08-04
  Administered 2015-08-03 – 2015-08-04 (×2): 10 mg via ORAL
  Filled 2015-08-01 (×2): qty 1

## 2015-08-01 MED ORDER — BUPIVACAINE LIPOSOME 1.3 % IJ SUSP
INTRAMUSCULAR | Status: DC | PRN
  Administered 2015-08-01: 20 mL

## 2015-08-01 MED ORDER — FENTANYL CITRATE (PF) 100 MCG/2ML IJ SOLN
INTRAMUSCULAR | Status: DC | PRN
Start: 2015-08-01 — End: 2015-08-01
  Administered 2015-08-01: 50 ug via INTRAVENOUS
  Administered 2015-08-01: 75 ug via INTRAVENOUS
  Administered 2015-08-01: 50 ug via INTRAVENOUS
  Administered 2015-08-01: 75 ug via INTRAVENOUS

## 2015-08-01 MED ORDER — LACTATED RINGERS IV SOLN
INTRAVENOUS | Status: DC | PRN
  Administered 2015-08-01: 14:00:00 via INTRAVENOUS

## 2015-08-01 MED ORDER — 0.9 % SODIUM CHLORIDE (POUR BTL) OPTIME
TOPICAL | Status: DC | PRN
  Administered 2015-08-01: 1000 mL

## 2015-08-01 MED ORDER — OXYCODONE-ACETAMINOPHEN 5-325 MG PO TABS: 1.0000 | ORAL_TABLET | ORAL | Status: DC | PRN

## 2015-08-01 MED ORDER — PROPOFOL 10 MG/ML IV BOLUS
INTRAVENOUS | Status: DC | PRN
  Administered 2015-08-01: 100 mg via INTRAVENOUS

## 2015-08-01 MED ORDER — CEFAZOLIN SODIUM-DEXTROSE 2-3 GM-% IV SOLR
INTRAVENOUS | Status: DC | PRN
Start: 2015-08-01 — End: 2015-08-01
  Administered 2015-08-01: 2 g via INTRAVENOUS

## 2015-08-01 MED ORDER — ROCURONIUM BROMIDE 50 MG/5ML IV SOLN
INTRAVENOUS | Status: AC
  Filled 2015-08-01: qty 1

## 2015-08-01 MED ORDER — SURGIFOAM 100 EX MISC
CUTANEOUS | Status: DC | PRN
  Administered 2015-08-01: 14:00:00 via TOPICAL

## 2015-08-01 MED ORDER — SODIUM CHLORIDE 0.9 % IJ SOLN: 3.0000 mL | INTRAMUSCULAR | Status: DC | PRN

## 2015-08-01 MED ORDER — VANCOMYCIN HCL 1000 MG IV SOLR
INTRAVENOUS | Status: AC
  Filled 2015-08-01: qty 1000

## 2015-08-01 MED ORDER — ACETAMINOPHEN 650 MG RE SUPP: 650.0000 mg | RECTAL | Status: DC | PRN

## 2015-08-01 MED ORDER — ACETAMINOPHEN 325 MG PO TABS: 650.0000 mg | ORAL_TABLET | ORAL | Status: DC | PRN

## 2015-08-01 MED ORDER — NEOSTIGMINE METHYLSULFATE 10 MG/10ML IV SOLN
INTRAVENOUS | Status: DC | PRN
  Administered 2015-08-01: 3 mg via INTRAVENOUS

## 2015-08-01 MED ORDER — PHENYLEPHRINE HCL 10 MG/ML IJ SOLN
INTRAMUSCULAR | Status: DC | PRN
  Administered 2015-08-01: 40 ug via INTRAVENOUS

## 2015-08-01 MED ORDER — CEFAZOLIN SODIUM 1-5 GM-% IV SOLN
1.0000 g | Freq: Three times a day (TID) | INTRAVENOUS | Status: AC
  Administered 2015-08-01 – 2015-08-02 (×2): 1 g via INTRAVENOUS
  Filled 2015-08-01 (×2): qty 50

## 2015-08-01 MED ORDER — LIDOCAINE-EPINEPHRINE 1 %-1:100000 IJ SOLN
INTRAMUSCULAR | Status: DC | PRN
  Administered 2015-08-01: 2 mL

## 2015-08-01 MED ORDER — ROCURONIUM BROMIDE 100 MG/10ML IV SOLN
INTRAVENOUS | Status: DC | PRN
  Administered 2015-08-01: 10 mg via INTRAVENOUS
  Administered 2015-08-01: 20 mg via INTRAVENOUS
  Administered 2015-08-01: 40 mg via INTRAVENOUS

## 2015-08-01 MED ORDER — FENTANYL CITRATE (PF) 250 MCG/5ML IJ SOLN
INTRAMUSCULAR | Status: AC
  Filled 2015-08-01: qty 5

## 2015-08-01 MED ORDER — LIDOCAINE HCL (CARDIAC) 20 MG/ML IV SOLN
INTRAVENOUS | Status: DC | PRN
  Administered 2015-08-01: 50 mg via INTRAVENOUS

## 2015-08-01 MED ORDER — FENTANYL CITRATE (PF) 100 MCG/2ML IJ SOLN
25.0000 ug | INTRAMUSCULAR | Status: AC | PRN
  Administered 2015-08-02: 25 ug via INTRAVENOUS
  Administered 2015-08-03 (×5): 50 ug via INTRAVENOUS
  Filled 2015-08-01 (×6): qty 2

## 2015-08-01 MED ORDER — GLYCOPYRROLATE 0.2 MG/ML IJ SOLN
INTRAMUSCULAR | Status: DC | PRN
  Administered 2015-08-01: 0.2 mg via INTRAVENOUS
  Administered 2015-08-01: 0.4 mg via INTRAVENOUS

## 2015-08-01 MED ORDER — LACTATED RINGERS IV SOLN
INTRAVENOUS | Status: DC | PRN
  Administered 2015-08-01: 13:00:00 via INTRAVENOUS

## 2015-08-01 MED ORDER — PHENYLEPHRINE HCL 10 MG/ML IJ SOLN
10.0000 mg | INTRAVENOUS | Status: DC | PRN
  Administered 2015-08-01: 20 ug/min via INTRAVENOUS

## 2015-08-01 MED ORDER — BUPIVACAINE LIPOSOME 1.3 % IJ SUSP
20.0000 mL | INTRAMUSCULAR | Status: AC
  Filled 2015-08-01: qty 20

## 2015-08-01 MED ORDER — PHENOL 1.4 % MT LIQD: 1.0000 | OROMUCOSAL | Status: DC | PRN

## 2015-08-01 MED ORDER — VANCOMYCIN HCL 1000 MG IV SOLR
INTRAVENOUS | Status: DC | PRN
  Administered 2015-08-01: 1000 mg via TOPICAL

## 2015-08-01 MED ORDER — ONDANSETRON HCL 4 MG/2ML IJ SOLN: 4.0000 mg | INTRAMUSCULAR | Status: DC | PRN

## 2015-08-01 MED ORDER — SODIUM CHLORIDE 0.9 % IR SOLN
Status: DC | PRN
  Administered 2015-08-01: 14:00:00

## 2015-08-01 SURGICAL SUPPLY — 72 items
APL SKNCLS STERI-STRIP NONHPOA (GAUZE/BANDAGES/DRESSINGS) ×1
BAG DECANTER FOR FLEXI CONT (MISCELLANEOUS) ×2 IMPLANT
BENZOIN TINCTURE PRP APPL 2/3 (GAUZE/BANDAGES/DRESSINGS) ×2 IMPLANT
BIT DRILL 5.0/4.0 (BIT) IMPLANT
BLADE CLIPPER SURG (BLADE) IMPLANT
BLADE SURG 11 STRL SS (BLADE) ×2 IMPLANT
BONE ALLOSTEM MORSELIZED 5CC (Bone Implant) ×1 IMPLANT
BRUSH SCRUB EZ PLAIN DRY (MISCELLANEOUS) ×2 IMPLANT
BUR MATCHSTICK NEURO 3.0 LAGG (BURR) ×2 IMPLANT
BUR PRECISION FLUTE 6.0 (BURR) ×2 IMPLANT
CAGE RISE 11-17-15 10X22 (Cage) ×2 IMPLANT
CANISTER SUCT 3000ML PPV (MISCELLANEOUS) ×2 IMPLANT
CAP LOCKING (Cap) ×4 IMPLANT
CAP LOCKING 5.5 CREO (Cap) IMPLANT
CONT SPEC 4OZ CLIKSEAL STRL BL (MISCELLANEOUS) ×2 IMPLANT
COVER BACK TABLE 60X90IN (DRAPES) ×2 IMPLANT
DECANTER SPIKE VIAL GLASS SM (MISCELLANEOUS) ×2 IMPLANT
DRAPE C-ARM 42X72 X-RAY (DRAPES) ×2 IMPLANT
DRAPE C-ARMOR (DRAPES) ×2 IMPLANT
DRAPE LAPAROTOMY 100X72X124 (DRAPES) ×2 IMPLANT
DRAPE POUCH INSTRU U-SHP 10X18 (DRAPES) ×2 IMPLANT
DRAPE PROXIMA HALF (DRAPES) IMPLANT
DRAPE SURG 17X23 STRL (DRAPES) ×2 IMPLANT
DRILL 5.0/4.0 (BIT) ×2
DRSG OPSITE 4X5.5 SM (GAUZE/BANDAGES/DRESSINGS) ×1 IMPLANT
DRSG OPSITE POSTOP 4X6 (GAUZE/BANDAGES/DRESSINGS) ×1 IMPLANT
DURAPREP 26ML APPLICATOR (WOUND CARE) ×2 IMPLANT
ELECT REM PT RETURN 9FT ADLT (ELECTROSURGICAL) ×2
ELECTRODE REM PT RTRN 9FT ADLT (ELECTROSURGICAL) ×1 IMPLANT
EVACUATOR 1/8 PVC DRAIN (DRAIN) ×1 IMPLANT
EVACUATOR 3/16  PVC DRAIN (DRAIN)
EVACUATOR 3/16 PVC DRAIN (DRAIN) ×1 IMPLANT
GAUZE SPONGE 4X4 12PLY STRL (GAUZE/BANDAGES/DRESSINGS) ×2 IMPLANT
GAUZE SPONGE 4X4 16PLY XRAY LF (GAUZE/BANDAGES/DRESSINGS) IMPLANT
GLOVE BIO SURGEON STRL SZ8 (GLOVE) ×4 IMPLANT
GLOVE ECLIPSE 7.5 STRL STRAW (GLOVE) IMPLANT
GLOVE EXAM NITRILE LRG STRL (GLOVE) IMPLANT
GLOVE EXAM NITRILE MD LF STRL (GLOVE) IMPLANT
GLOVE EXAM NITRILE XL STR (GLOVE) IMPLANT
GLOVE EXAM NITRILE XS STR PU (GLOVE) IMPLANT
GLOVE INDICATOR 8.5 STRL (GLOVE) ×4 IMPLANT
GOWN STRL REUS W/ TWL LRG LVL3 (GOWN DISPOSABLE) IMPLANT
GOWN STRL REUS W/ TWL XL LVL3 (GOWN DISPOSABLE) ×2 IMPLANT
GOWN STRL REUS W/TWL 2XL LVL3 (GOWN DISPOSABLE) IMPLANT
GOWN STRL REUS W/TWL LRG LVL3 (GOWN DISPOSABLE)
GOWN STRL REUS W/TWL XL LVL3 (GOWN DISPOSABLE) ×4
KIT BASIN OR (CUSTOM PROCEDURE TRAY) ×2 IMPLANT
KIT ROOM TURNOVER OR (KITS) ×2 IMPLANT
LIQUID BAND (GAUZE/BANDAGES/DRESSINGS) ×2 IMPLANT
NDL HYPO 21X1.5 SAFETY (NEEDLE) IMPLANT
NDL HYPO 25X1 1.5 SAFETY (NEEDLE) ×1 IMPLANT
NEEDLE HYPO 21X1.5 SAFETY (NEEDLE) ×2 IMPLANT
NEEDLE HYPO 25X1 1.5 SAFETY (NEEDLE) ×2 IMPLANT
NS IRRIG 1000ML POUR BTL (IV SOLUTION) ×2 IMPLANT
PACK LAMINECTOMY NEURO (CUSTOM PROCEDURE TRAY) ×2 IMPLANT
PAD ARMBOARD 7.5X6 YLW CONV (MISCELLANEOUS) ×6 IMPLANT
ROD SPINAL 35MM (Rod) ×2 IMPLANT
SCREW CORT CREO 6.0-5.0X30MM (Screw) ×2 IMPLANT
SHAFT CREO 30MM (Neuro Prosthesis/Implant) ×2 IMPLANT
SPONGE LAP 4X18 X RAY DECT (DISPOSABLE) IMPLANT
SPONGE SURGIFOAM ABS GEL 100 (HEMOSTASIS) ×2 IMPLANT
STRIP CLOSURE SKIN 1/2X4 (GAUZE/BANDAGES/DRESSINGS) ×3 IMPLANT
SUT VIC AB 0 CT1 18XCR BRD8 (SUTURE) ×2 IMPLANT
SUT VIC AB 0 CT1 8-18 (SUTURE) ×4
SUT VIC AB 2-0 CT1 18 (SUTURE) ×2 IMPLANT
SUT VICRYL 4-0 PS2 18IN ABS (SUTURE) ×2 IMPLANT
SYR 20CC LL (SYRINGE) ×1 IMPLANT
TOWEL OR 17X24 6PK STRL BLUE (TOWEL DISPOSABLE) ×2 IMPLANT
TOWEL OR 17X26 10 PK STRL BLUE (TOWEL DISPOSABLE) ×2 IMPLANT
TRAY FOLEY W/METER SILVER 14FR (SET/KITS/TRAYS/PACK) ×2 IMPLANT
TULIP CREP AMP 5.5MM (Orthopedic Implant) ×2 IMPLANT
WATER STERILE IRR 1000ML POUR (IV SOLUTION) ×2 IMPLANT

## 2015-08-01 NOTE — Anesthesia Procedure Notes (Signed)
Procedure Name: Intubation Date/Time: 08/01/2015 1:02 PM Performed by: Susa Loffler Pre-anesthesia Checklist: Patient identified, Patient being monitored, Emergency Drugs available, Timeout performed and Suction available Patient Re-evaluated:Patient Re-evaluated prior to inductionOxygen Delivery Method: Circle system utilized Preoxygenation: Pre-oxygenation with 100% oxygen Intubation Type: IV induction Ventilation: Mask ventilation without difficulty Laryngoscope Size: Mac and 3 Grade View: Grade I Tube type: Oral Tube size: 7.0 mm Number of attempts: 1 Airway Equipment and Method: Stylet Placement Confirmation: ETT inserted through vocal cords under direct vision,  positive ETCO2 and breath sounds checked- equal and bilateral Secured at: 21 cm Tube secured with: Tape Dental Injury: Teeth and Oropharynx as per pre-operative assessment

## 2015-08-01 NOTE — Progress Notes (Signed)
Inpatient Diabetes Program Recommendations  AACE/ADA: New Consensus Statement on Inpatient Glycemic Control (2015)  Target Ranges:  Prepandial:   less than 140 mg/dL      Peak postprandial:   less than 180 mg/dL (1-2 hours)      Critically ill patients:  140 - 180 mg/dL   Review of Glycemic Control  Diabetes history: None Current orders for Inpatient glycemic control: None  Inpatient Diabetes Program Recommendations: Correction (SSI): Patient on Decadron 4mg  4x/day. Patient had an elevated lab glucose on 10/13 at 1715 at 215 mg/dl. Please consider obtaining CBGs and possibly starting Novolog Sensitive Correction TID.  Thanks,  Tama Headings RN, MSN, [facility] Inpatient Diabetes Coordinator Team Pager [phone] (8a-5p)

## 2015-08-01 NOTE — Transfer of Care (Signed)
Immediate Anesthesia Transfer of Care Note  Patient: Alyssa [name]  Procedure(s) Performed: Procedure(s): Decompression and fusion lumbar four and five with cages screws and rods (N/A)  Patient Location: PACU  Anesthesia Type:General  Level of Consciousness: awake, alert  and oriented  Airway & Oxygen Therapy: Patient Spontanous Breathing and Patient connected to nasal cannula oxygen  Post-op Assessment: Report given to RN and Post -op Vital signs reviewed and stable  Post vital signs: Reviewed and stable  Last Vitals:  Filed Vitals:   08/01/15 1630  BP: 169/93  Pulse: 86  Temp:   Resp: 18    Complications: No apparent anesthesia complications

## 2015-08-01 NOTE — Anesthesia Preprocedure Evaluation (Signed)
Anesthesia Evaluation  Patient identified by MRN, date of birth, ID band Patient awake    Reviewed: Allergy & Precautions, NPO status , Patient's Chart, lab work & pertinent test results  Airway Mallampati: II  TM Distance: >3 FB Neck ROM: Full    Dental   Pulmonary Current Smoker,    breath sounds clear to auscultation       Cardiovascular hypertension, + Peripheral Vascular Disease   Rhythm:Regular Rate:Normal     Neuro/Psych    GI/Hepatic negative GI ROS,   Endo/Other  diabetes  Renal/GU History noted. CE     Musculoskeletal   Abdominal   Peds  Hematology   Anesthesia Other Findings   Reproductive/Obstetrics                             Anesthesia Physical Anesthesia Plan  ASA: III  Anesthesia Plan: General   Post-op Pain Management:    Induction: Intravenous  Airway Management Planned: Oral ETT  Additional Equipment:   Intra-op Plan:   Post-operative Plan: Possible Post-op intubation/ventilation  Informed Consent:   Dental advisory given  Plan Discussed with: CRNA and Anesthesiologist  Anesthesia Plan Comments:         Anesthesia Quick Evaluation

## 2015-08-01 NOTE — Progress Notes (Signed)
Patient returned to the unit alert and oriented x. 4. Honey comb dressing clean and intact. Will continue to monitor

## 2015-08-01 NOTE — Care Management Important Message (Signed)
Important Message  Patient Details  Name: Alyssa [name] MRN: [id number] Date of Birth: 06-29-1941   Medicare Important Message Given:  Yes-second notification given    Delorse Lek 08/01/2015, 11:03 AM

## 2015-08-01 NOTE — Progress Notes (Signed)
Patient ID: Alyssa [name], female   DOB: 10/14/1941, 74 y.o.   MRN: [id number] Patient with improved pain stable function left looks to me. I have extensively reviewed the risks and benefits of a 1 level posterior lumbar interbody fusion with her as well as perioperative course expectations of outcome and alternatives surgery and she understands and agrees to proceed forward.

## 2015-08-01 NOTE — Op Note (Signed)
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 and left-sided foot drop  Postoperative diagnosis: Same  Procedure: #1 decompressive lumbar laminectomy L4-5 and excess and requiring more work to would be needed with a standard interbody fusion with complete facetectomies radical foraminotomies of the L4 and L5 nerve roots  #2 posterior lumbar interbody fusion using the globus rise expandable cage system packed with locally harvested autograft mixed with allostem morsels  #3 cortical screw fixation L4-5 using the globus Creo cortical screw set with 6050 by 30 mm screws  Surgeon: Dominica Severin Beverlee Wilmarth  Asst.: Sherley Bounds  Anesthesia: Gen.  EBL: Minimal  History of present illness: Patient is 74 year old female is a long same back pain presented with 2 weeks of complete left sided foot drop. Workup revealed progression of disc disease grade 1 spinal listhesis diastases of her facets and severe spinal stenosis at L4-5. As well as large extra foraminal disc herniations bilaterally. Due to patient's progression of clinical syndrome failed conservative treatment patient was admitted hospital underwent stabilization and steroids and was brought to the OR for decompressive laminectomy and fusion extensor were the risks and benefits of the operation with the patient as well as perioperative course expectations of outcome and alternatives of surgery and she understood and agreed to proceed forward.  Operative procedure: Patient brought into the or was induced under general anesthesia positioned prone the Wilson frame her back was prepped and draped in routine sterile fashion preoperative x-ray localize the appropriate level so after 5 mL of lidocaine with epi a midline incision was made and Bovie cautery was used to thin subcutaneous tissues and subperiosteal dissections care lamina of L4 and L5 expose the facet complexes at L4-5 and the  cortical screw entry point at L4. Interoperative x-ray confirmed the location appropriate level so pilot holes were drilled and then under fluoroscopic guidance both AP and lateral trajectory holes were drilled probed and tapped probed again and 6050 by 30 mm screws were inserted L4. At this point the spinous process was removed central decompression was begun the facets were markedly diastases and hypertrophic and the facet was under a moderate amount compression from hourglass overgrowth of the facets. This is all removed in piecemeal fashion decompress the central canal first identify the L5 nerve root and decompressed flush with the L5 pedicle. Then I finally identify the L4 nerve root which was really deep and underneath the L4 pedicle partially due to the slip. There was a large to disc herniation I incised the disc space cleaned the space at radically both sides extended nevus from the space and removing dense amount of extra foraminal discoloration bilaterally underneath the 4 root. Using sequential distraction this helped reduce the deformity and then the 4 roots were clearly visualized and I decompressed radically the L4 nerve roots. Then clean the disc space out bilaterally removed a lot more central disc and free fragments scraped the endplates and inserted the cage expanded in size cage A used was a 10 to spelled leg crush 11-17 expandable 22 mm in length globus cage. At this was packed with local autograft mixed that packed with local autograft mixed centrally prior to placement of the second cage. After both cages were inserted the spine was significantly reduced and the disc space was opened up. And the foramina were subsequent decompressed. Then I placed bilateral L5 screws under fluoroscopic guidance all screws excellent purchase similar heads on the L4 screws to proceed irrigated wound reinspected  the foramen to confirm patency laid Gelfoam overtop the dura speckled vancomycin powder and the wound  connected up the rods and top tightening nuts were anchored in place drain was placed the wounds closed in layers with Vicryl skin was closed running 4 subcuticular I injected X Burrell the fascia more vancomycin extrafascially and a septic or and skin with Dermabond benzo and Steri-Strips. The wounds and dressed patient recovered in stable condition. At the end of case all needle counts sponge counts were correct.

## 2015-08-01 NOTE — Care Management Note (Signed)
Case Management Note  Patient Details  Name: KHADESHA PASSOW MRN: [id number] Date of Birth: 1941-04-26  Subjective/Objective:                    Action/Plan: Patient admitted with spondylolisthesis of L4-5. Patient has been receiving IV decadron and IV pain meds and is going to the OR today. Patient is from home with family. Awaiting PT/OT recommendations post surgery for discharge disposition. CM will continue to follow for discharge needs.   Expected Discharge Date:                  Expected Discharge Plan:  [place]  In-House Referral:     Discharge planning Services     Post Acute Care Choice:    Choice offered to:     DME Arranged:    DME Agency:     HH Arranged:    [place] Agency:     Status of Service:  In process, will continue to follow  Medicare Important Message Given:  Yes-second notification given Date Medicare IM Given:    Medicare IM give by:    Date Additional Medicare IM Given:    Additional Medicare Important Message give by:     If discussed at [place] of Stay Meetings, dates discussed:    Additional Comments:  Pollie Friar, RN 08/01/2015, 11:45 AM

## 2015-08-02 LAB — BASIC METABOLIC PANEL
Anion gap: 9 (ref 5–15)
BUN: 30 mg/dL — ABNORMAL HIGH (ref 6–20)
CO2: 17 mmol/L — ABNORMAL LOW (ref 22–32)
Calcium: 8.2 mg/dL — ABNORMAL LOW (ref 8.9–10.3)
Chloride: 106 mmol/L (ref 101–111)
Creatinine, Ser: 1.4 mg/dL — ABNORMAL HIGH (ref 0.44–1.00)
GFR calc Af Amer: 42 mL/min — ABNORMAL LOW (ref >60)
GFR calc non Af Amer: 37 mL/min — ABNORMAL LOW (ref >60)
Glucose, Bld: 138 mg/dL — ABNORMAL HIGH (ref 65–99)
Potassium: 5.2 mmol/L — ABNORMAL HIGH (ref 3.5–5.1)
Sodium: 132 mmol/L — ABNORMAL LOW (ref 135–145)

## 2015-08-02 NOTE — Progress Notes (Signed)
Paged ortho tech for pt's back brace. Awaiting reply.

## 2015-08-02 NOTE — Progress Notes (Addendum)
Subjective: Patient reports doing well  Objective: Vital signs in last 24 hours: Temp:  [97.5 F (36.4 C)-99 F (37.2 C)] 98.8 F (37.1 C) (10/15 0519) Pulse Rate:  [57-86] 65 (10/15 0519) Resp:  [10-19] 18 (10/15 0519) BP: (118-179)/(63-93) 127/65 mmHg (10/15 0519) SpO2:  [99 %-100 %] 100 % (10/15 0519)  Intake/Output from previous day: 10/14 0701 - 10/15 0700 In: 1400 [I.V.:1400] Out: 2640 [Urine:2400; Drains:140; Blood:100] Intake/Output this shift:    Physical Exam: LE strength full bilaterally, apart from baseline left footdrop.  Patient feels leg strength is unchanged.  Lab Results:  Recent Labs  07/31/15 1715 08/01/15 1213 08/01/15 1245  WBC 8.9  --   --   HGB 9.4* 4.8* 9.9*  HCT 28.4* 14.0* 29.0*  PLT 378  --   --    BMET  Recent Labs  07/31/15 1715 08/01/15 1213 08/01/15 1245  NA 135 136 138  K 5.5* 4.8 5.2*  CL 114*  --   --   CO2 13*  --   --   GLUCOSE 215* 72 116*  BUN 32*  --   --   CREATININE 1.77*  --   --   CALCIUM 8.7*  --   --     Studies/Results: Dg Lumbar Spine 2-3 Views  08/01/2015  CLINICAL DATA:  Lumbar spine surgery. EXAM: DG C-ARM 61-120 MIN; LUMBAR SPINE - 2-3 VIEW COMPARISON:  MRI 07/30/2015 FINDINGS: Lumbar spine numbered as per prior MRI. L4 and L5 posterior pedicle screws and inter disc fusion noted. Hardware intact. Minimal anterolisthesis L4 on L5 . IMPRESSION: Postsurgical changes L4-L5. Electronically Signed   By: Marcello Moores  Register   On: 08/01/2015 16:12   Dg C-arm 1-60 Min  08/01/2015  CLINICAL DATA:  Lumbar spine surgery. EXAM: DG C-ARM 61-120 MIN; LUMBAR SPINE - 2-3 VIEW COMPARISON:  MRI 07/30/2015 FINDINGS: Lumbar spine numbered as per prior MRI. L4 and L5 posterior pedicle screws and inter disc fusion noted. Hardware intact. Minimal anterolisthesis L4 on L5 . IMPRESSION: Postsurgical changes L4-L5. Electronically Signed   By: Marcello Moores  Register   On: 08/01/2015 16:12    Assessment/Plan: Patient doing well.  Mobilize  in brace.    LOS: 3 days    Peggyann Shoals, MD 08/02/2015, 7:46 AM

## 2015-08-02 NOTE — Progress Notes (Signed)
PT Cancellation Note  Patient Details Name: Alyssa [name] MRN: [id number] DOB: 1941-03-16   Cancelled Treatment:    Reason Eval/Treat Not Completed: Other (comment) (Awaiting for arrival of back brace.). PT to return to mobilize and assess function once patient has back brace.   Kingsley Callander 08/02/2015, 1:06 PM   Kittie Plater, PT, DPT Pager #: [phone] Office #: [phone]

## 2015-08-03 NOTE — Evaluation (Signed)
Physical Therapy Evaluation Patient Details Name: Alyssa [name] MRN: [id number] DOB: 1941-07-07 Today's Date: 08/03/2015   History of Present Illness  Patient is a 74 y/o female with hx of chronic back pain and worsening LLE pain/weakness with foot drop. S/p decompression/fusion L4-5 10/14. PMH includes HTN, HLD, PVD.  Clinical Impression  Patient seen for re-evaluation post surgery. Patient demonstrates deficits in functional mobility as indicated below. Will benefit from continued skilled PT to address deficits and maximize function. Will see as indicated and progress as tolerated.     Follow Up Recommendations Supervision/Assistance - 24 hour    Equipment Recommendations  None recommended by PT    Recommendations for Other Services OT consult     Precautions / Restrictions Precautions Precautions: Fall;Back Precaution Booklet Issued: Yes (comment) Precaution Comments: verbally educated and reviewed with teach back Required Braces or Orthoses: Spinal Brace Spinal Brace: Lumbar corset Restrictions Weight Bearing Restrictions: No      Mobility  Bed Mobility Overal bed mobility: Needs Assistance Bed Mobility: Rolling;Sidelying to Sit Rolling: Supervision Sidelying to sit: Supervision       General bed mobility comments: VCs for technique  Transfers Overall transfer level: Needs assistance Equipment used: Rolling walker (2 wheeled) Transfers: Sit to/from Stand Sit to Stand: Supervision         General transfer comment: VCs for hand placement and sequencing, min guard for stability upon coming to standing  Ambulation/Gait Ambulation/Gait assistance: Supervision Ambulation Distance (Feet): 140 Feet Assistive device: Rolling walker (2 wheeled) Gait Pattern/deviations: Step-through pattern;Step-to pattern;Decreased dorsiflexion - left;Steppage Gait velocity: decreased Gait velocity interpretation: Below normal speed for age/gender General Gait Details:  Walking on toe on RLE due to shorter leg length (wears lift in shoe). Steppage gait noted to clear LLE.  Stairs Stairs: Yes Stairs assistance: Modified independent (Device/Increase time) Stair Management: Two rails;Step to pattern;Forwards Number of Stairs: 3 General stair comments: no cues or assist required  Wheelchair Mobility    Modified Rankin (Stroke Patients Only)       Balance     Sitting balance-Leahy Scale: Fair       Standing balance-Leahy Scale: Fair                               Pertinent Vitals/Pain Pain Assessment: 0-10 Pain Score: 8  Pain Location: left toe pain Pain Descriptors / Indicators: Aching    Home Living Family/patient expects to be discharged to:: Private residence Living Arrangements: Children Available Help at Discharge: Family;Available PRN/intermittently Type of Home: House Home Access: Stairs to enter Entrance Stairs-Rails: Can reach both Entrance Stairs-Number of Steps: 3 in back and 3 in front Home Layout: One level Home Equipment: Walker - standard;Shower seat;Cane - single point;Toilet riser      Prior Function Level of Independence: Independent with assistive device(s)         Comments: Uses walker or cane PRN      Hand Dominance   Dominant Hand: Right    Extremity/Trunk Assessment               Lower Extremity Assessment: LLE deficits/detail (LLE leg length descrepency)   LLE Deficits / Details: Grossly ~2/5 DF; 3+/5 throughout.     Communication   Communication: No difficulties  Cognition Arousal/Alertness: Awake/alert Behavior During Therapy: WFL for tasks assessed/performed Overall Cognitive Status: Within Functional Limits for tasks assessed  General Comments      Exercises        Assessment/Plan    PT Assessment Patient needs continued PT services  PT Diagnosis Difficulty walking;Abnormality of gait   PT Problem List Decreased  strength;Decreased range of motion;Impaired sensation;Decreased activity tolerance;Decreased balance;Decreased mobility;Decreased knowledge of precautions  PT Treatment Interventions Balance training;Gait training;Functional mobility training;Therapeutic activities;Therapeutic exercise;Patient/family education;Stair training   PT Goals (Current goals can be found in the Care Plan section) Acute Rehab PT Goals Patient Stated Goal: to be able to decrease pain. PT Goal Formulation: With patient Time For Goal Achievement: 08/14/15 Potential to Achieve Goals: Fair    Frequency Min 5X/week   Barriers to discharge Decreased caregiver support home alone for breif periods during the day    Co-evaluation               End of Session Equipment Utilized During Treatment: Gait belt Activity Tolerance: Patient tolerated treatment well Patient left: in chair;with call bell/phone within reach Nurse Communication: Mobility status         Time: [id number] PT Time Calculation (min) (ACUTE ONLY): 25 min   Charges:   PT Evaluation $PT Re-evaluation: 1 Procedure     PT G CodesDuncan Dull 21-Aug-2015, 9:46 AM Alben Deeds, PT DPT  [phone]

## 2015-08-03 NOTE — Progress Notes (Signed)
Overall she is doing well. She has appropriate back soreness. Some left leg aching. I think she has some mild weakness in the left dorsiflexors. She is sitting up in a chair. She looks comfortable. Continue therapy.

## 2015-08-03 NOTE — Evaluation (Signed)
Occupational Therapy  Re-Evaluation Patient Details Name: Alyssa [name] MRN: [id number] DOB: 24-Apr-1941 Today's Date: 08/03/2015    History of Present Illness Patient is a 74 y/o female with hx of chronic back pain and worsening LLE pain/weakness with foot drop. S/p decompression/fusion L4-5 10/14. PMH includes HTN, HLD, PVD.   Clinical Impression   Pt with complaints of L toe pain radiating upward.  Educated in back precautions.  Pt overall performing at a supervision level. Will follow to reinforce back precautions, but do not anticipate pt will need post acute OT.     Follow Up Recommendations  No OT follow up    Equipment Recommendations  None recommended by OT    Recommendations for Other Services       Precautions / Restrictions Precautions Precautions: Fall;Back Precaution Booklet Issued: Yes (comment) Precaution Comments: verbally educated and reviewed with teach back Required Braces or Orthoses: Spinal Brace Spinal Brace: Lumbar corset;Applied in sitting position Restrictions Weight Bearing Restrictions: No      Mobility Bed Mobility Overal bed mobility: Needs Assistance Bed Mobility: Rolling;Sidelying to Sit Rolling: Supervision Sidelying to sit: Supervision       General bed mobility comments: VCs for technique  Transfers Overall transfer level: Needs assistance Equipment used: Rolling walker (2 wheeled) Transfers: Sit to/from Stand Sit to Stand: Supervision         General transfer comment: verbal cues for hand placement    Balance   Sitting-balance support: Feet supported Sitting balance-Leahy Scale: Fair     Standing balance support: During functional activity Standing balance-Leahy Scale: Good                              ADL Overall ADL's : Needs assistance/impaired Eating/Feeding: Independent;Sitting   Grooming: Wash/dry hands;Supervision/safety;Standing   Upper Body Bathing: Set up;Sitting   Lower Body  Bathing: Supervison/ safety;Sit to/from stand   Upper Body Dressing : Minimal assistance;Sitting Upper Body Dressing Details (indicate cue type and reason): back brace Lower Body Dressing: Supervision/safety;Sit to/from stand Lower Body Dressing Details (indicate cue type and reason): able to bring feet up to donn socks Toilet Transfer: Supervision/safety;RW;Ambulation;BSC (BSC over toilet) Toilet Transfer Details (indicate cue type and reason): recommended pt place her 3 in1 over toilet at home [place] and Hygiene: Minimal assistance;Sit to/from stand Toileting - Clothing Manipulation Details (indicate cue type and reason): assist for gown, pericare with supervision and verbal cues to avoid twisting     Functional mobility during ADLs: Supervision/safety;Rolling walker General ADL Comments: Pt will have 24 hour assist of family initially upon return home.  Agreeable to use of RW at home.      Vision     Perception     Praxis      Pertinent Vitals/Pain Pain Assessment: 0-10 Pain Score: 8  Pain Location: L toes Pain Descriptors / Indicators: Aching Pain Intervention(s): Limited activity within patient's tolerance;Repositioned     Hand Dominance Right   Extremity/Trunk Assessment Upper Extremity Assessment Upper Extremity Assessment: Overall WFL for tasks assessed (arthritic change in hands)   Lower Extremity Assessment Lower Extremity Assessment: Defer to PT evaluation LLE Deficits / Details: Grossly ~2/5 DF; 3+/5 throughout.       Communication Communication Communication: No difficulties   Cognition Arousal/Alertness: Awake/alert Behavior During Therapy: WFL for tasks assessed/performed Overall Cognitive Status: Within Functional Limits for tasks assessed  General Comments       Exercises       Shoulder Instructions      Home Living Family/patient expects to be discharged to:: Private residence Living  Arrangements: Children Available Help at Discharge: Family;Available PRN/intermittently Type of Home: House Home Access: Stairs to enter [employer] of Steps: 3 in back and 3 in front Entrance Stairs-Rails: Can reach both Home Layout: One level     Bathroom Shower/Tub: Tub/shower unit Shower/tub characteristics: Architectural technologist: Standard Bathroom Accessibility: Yes How Accessible: Accessible via walker Home Equipment: Walker - standard;Shower seat;Cane - single point;Toilet riser;Adaptive equipment Adaptive Equipment: Reacher        Prior Functioning/Environment Level of Independence: Independent with assistive device(s)        Comments: Uses walker or cane PRN     OT Diagnosis: Generalized weakness;Acute pain   OT Problem List: Impaired balance (sitting and/or standing);Decreased activity tolerance;Decreased strength;Decreased knowledge of use of DME or AE;Decreased knowledge of precautions;Pain   OT Treatment/Interventions: Self-care/ADL training;DME and/or AE instruction;Patient/family education;Therapeutic activities    OT Goals(Current goals can be found in the care plan section) Acute Rehab OT Goals Patient Stated Goal: home with family OT Goal Formulation: With patient Time For Goal Achievement: 08/10/15 Potential to Achieve Goals: Good ADL Goals Pt Will Perform Grooming: with modified independence;standing Pt Will Perform Lower Body Bathing: with modified independence;sit to/from stand Pt Will Perform Lower Body Dressing: with modified independence;sit to/from stand Pt Will Transfer to Toilet: with modified independence;ambulating;bedside commode (over toilet) Pt Will Perform Toileting - Clothing Manipulation and hygiene: with modified independence;sit to/from stand  OT Frequency: Min 2X/week   Barriers to D/C:            Co-evaluation              End of Session Equipment Utilized During Treatment: Gait belt;Rolling walker;Back  brace  Activity Tolerance: Patient tolerated treatment well Patient left: in chair;with call bell/phone within reach   Time: 0920-0943 OT Time Calculation (min): 23 min Charges:  OT General Charges $OT Visit: 1 Procedure OT Evaluation $OT Re-eval: 1 Procedure G-Codes:    Malka So 08/03/2015, 11:03 AM  [phone]

## 2015-08-04 LAB — BASIC METABOLIC PANEL
Anion gap: 9 (ref 5–15)
BUN: 29 mg/dL — ABNORMAL HIGH (ref 6–20)
CO2: 19 mmol/L — ABNORMAL LOW (ref 22–32)
Calcium: 8.9 mg/dL (ref 8.9–10.3)
Chloride: 105 mmol/L (ref 101–111)
Creatinine, Ser: 1.18 mg/dL — ABNORMAL HIGH (ref 0.44–1.00)
GFR calc Af Amer: 52 mL/min — ABNORMAL LOW (ref >60)
GFR calc non Af Amer: 45 mL/min — ABNORMAL LOW (ref >60)
Glucose, Bld: 124 mg/dL — ABNORMAL HIGH (ref 65–99)
Potassium: 4.9 mmol/L (ref 3.5–5.1)
Sodium: 133 mmol/L — ABNORMAL LOW (ref 135–145)

## 2015-08-04 MED ORDER — OXYCODONE HCL 10 MG PO TABS: 10.0000 mg | ORAL_TABLET | Freq: Every day | ORAL | Status: DC | PRN

## 2015-08-04 NOTE — Anesthesia Postprocedure Evaluation (Signed)
  Anesthesia Post-op Note  Patient: Alyssa [name]  Procedure(s) Performed: Procedure(s): Decompression and fusion lumbar four and five with cages screws and rods (N/A)  Patient Location: PACU  Anesthesia Type:General  Level of Consciousness: awake  Airway and Oxygen Therapy: Patient Spontanous Breathing  Post-op Pain: mild  Post-op Assessment: Post-op Vital signs reviewed LLE Motor Response: Purposeful movement LLE Sensation: Decreased, Numbness RLE Motor Response: Purposeful movement RLE Sensation: Full sensation      Post-op Vital Signs: Reviewed  Last Vitals:  Filed Vitals:   08/04/15 0946  BP: 125/65  Pulse: 79  Temp: 36.9 C  Resp: 18    Complications: No apparent anesthesia complications

## 2015-08-04 NOTE — Clinical Documentation Improvement (Signed)
Orthopedic  Abnormal Lab/Test Results:  08/02/15: Na= 132; 08/04/15: Na= 133  Possible Clinical Conditions associated with below indicators  Hypokalemia  Other Condition  Cannot Clinically Determine  Treatment Provided: IVF's   Please exercise your independent, professional judgment when responding. A specific answer is not anticipated or expected.   Thank You,  Rolm Gala, RN, [place] [phone]

## 2015-08-04 NOTE — Discharge Instructions (Signed)
No lifting no bending no twisting no driving a riding a car unless she is come back and forth to see me. Keep incision clean dry and intact. May remove the outer dressing in one to 2 days leave the Steri-Strips on and intact.

## 2015-08-04 NOTE — Clinical Social Work Note (Signed)
CSW  Reviewed chart. CSW will sign off.   [place], MSW, [place]

## 2015-08-04 NOTE — Progress Notes (Signed)
Physical Therapy Treatment Patient Details Name: Alyssa [name] MRN: [id number] DOB: 24-May-1941 Today's Date: 08/04/2015    History of Present Illness Patient is a 74 y/o female with hx of chronic back pain and worsening LLE pain/weakness with foot drop. S/p decompression/fusion L4-5 10/14. PMH includes HTN, HLD, PVD.    PT Comments    Pt has met all goals and verbally recited precautions. Pt required Min guard and vcs for stair negotiation. Pt demonstrated donning and doffing of brace without cueing. No further PT services recommended at this time. Recommended d/c to home with family assistance.  Follow Up Recommendations  No PT follow up;Supervision/Assistance - 24 hour     Equipment Recommendations  None recommended by PT    Recommendations for Other Services       Precautions / Restrictions Precautions Precautions: Back;Fall Precaution Booklet Issued: Yes (comment) Precaution Comments: verbally educated and reviewed with teach back Required Braces or Orthoses: Spinal Brace Spinal Brace: Lumbar corset;Applied in sitting position Restrictions Weight Bearing Restrictions: No    Mobility  Bed Mobility Overal bed mobility: Modified Independent Bed Mobility: Rolling;Sidelying to Sit Rolling: Modified independent (Device/Increase time) Sidelying to sit: Modified independent (Device/Increase time)     Sit to sidelying: Supervision General bed mobility comments: VCs for technique  Transfers Overall transfer level: Modified independent Equipment used: Rolling walker (2 wheeled) Transfers: Sit to/from Stand Sit to Stand: Modified independent (Device/Increase time)            Ambulation/Gait Ambulation/Gait assistance: Modified independent (Device/Increase time) Ambulation Distance (Feet): 180 Feet Assistive device: Rolling walker (2 wheeled)   Gait velocity: decreased   General Gait Details: Walking on toe on RLE due to shorter leg length (wears lift in  shoe). Steppage gait noted to clear LLE. L Foot drop   Stairs   Stairs assistance: Min guard A/Modified independent (Device/Increase time) Stair Management: Step to pattern;Two rails Number of Stairs: 3 General stair comments: Pt required vcs to ascend with stronger LE   Wheelchair Mobility    Modified Rankin (Stroke Patients Only)       Balance Overall balance assessment: History of Falls;Needs assistance Sitting-balance support: Feet supported;No upper extremity supported Sitting balance-Leahy Scale: Good     Standing balance support: During functional activity Standing balance-Leahy Scale: Good                      Cognition Arousal/Alertness: Awake/alert Behavior During Therapy: WFL for tasks assessed/performed Overall Cognitive Status: Within Functional Limits for tasks assessed                      Exercises      General Comments        Pertinent Vitals/Pain Pain Score: 8  Pain Location: L Knee Pain Descriptors / Indicators: Other (Comment) (neurogenic) Pain Intervention(s): Monitored during session    Home Living                      Prior Function            PT Goals (current goals can now be found in the care plan section)      Frequency       PT Plan      Co-evaluation             End of Session   Activity Tolerance: Patient tolerated treatment well Patient left: in chair;with family/visitor present;with call bell/phone within reach     Time: 1210-1230  PT Time Calculation (min) (ACUTE ONLY): 20 min  Charges:  $Therapeutic Activity: 8-22 mins                    G Codes:      Timia Casselman,CYNDI 08-26-15, 2:04 PM Magda Kiel, [place] 2015-08-26

## 2015-08-04 NOTE — Progress Notes (Signed)
Patient ID: Alyssa [name], female   DOB: 1940/10/25, 74 y.o.   MRN: [id number] Patient doing well significant improvement pain  Still baseline left foot drop  Discharge home

## 2015-08-04 NOTE — Discharge Summary (Signed)
  Physician Discharge Summary  Patient ID: Alyssa [name] MRN: [id number] DOB/AGE: 1941-06-06 74 y.o.  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 Patient did very well was angling and voiding postoperatively stable and cleared from physical therapy and cleared for discharge home. Patient was discharged to follow up in 1-2 weeks.  Consults: Significant Diagnostic Studies: Treatments: Decompression fusion L4-5 Discharge Exam: Blood pressure 125/65, pulse 79, temperature 98.5 F (36.9 C), temperature source Oral, resp. rate 18, height 5\' 3"  (1.6 m), weight 41.5 kg (91 lb 7.9 oz), SpO2 99 %. Strength out of 5 wound clean dry and intact  Disposition: Home     Medication List    STOP taking these medications        ibuprofen 200 MG tablet  Commonly known as:  ADVIL,MOTRIN     naproxen 375 MG tablet  Commonly known as:  NAPROSYN      TAKE these medications        Amlodipine-Valsartan-HCTZ 10-320-25 MG Tabs  Take 1 tablet by mouth daily.     GOODY HEADACHE PO  Take 1 Package by mouth daily as needed (or headaches).     aspirin-acetaminophen-caffeine 250-250-65 MG tablet  Commonly known as:  EXCEDRIN MIGRAINE  Take 1 tablet by mouth every 6 (six) hours as needed for headache.     citalopram 40 MG tablet  Commonly known as:  CELEXA  Take 40 mg by mouth daily.     ferrous sulfate 325 (65 FE) MG tablet  Take 325 mg by mouth daily with breakfast.     ONE-A-DAY WOMENS PO  Take 1 tablet by mouth daily.     Oxycodone HCl 10  MG Tabs  Take 10 mg by mouth daily as needed.     Oxycodone HCl 10 MG Tabs  Take 1 tablet (10 mg total) by mouth daily as needed (pain).     predniSONE 10 MG tablet  Commonly known as:  DELTASONE  Take 1 tablet (10 mg total) by mouth 2 (two) times daily with a meal.     rosuvastatin 10 MG tablet  Commonly known as:  CRESTOR  Take 10 mg by mouth every morning.     traZODone 50 MG tablet  Commonly known as:  DESYREL  Take 50 mg by mouth at bedtime.           Follow-up Information    Follow up with [facility], MD.   Specialty:  Neurosurgery   Contact information:   [address] [place] [address] [phone]       Signed: Elaina Hoops 08/04/2015, 11:52 AM

## 2015-08-04 NOTE — Progress Notes (Signed)
Patient d/c home. D/c instruction given and patient verbalized understanding.

## 2015-08-04 NOTE — Care Management Important Message (Signed)
Important Message  Patient Details  Name: Alyssa [name] MRN: [id number] Date of Birth: 03/31/1941   Medicare Important Message Given:  Yes-third notification given    Delorse Lek 08/04/2015, 1:29 PM

## 2015-08-04 NOTE — Clinical Documentation Improvement (Signed)
Orthopedic  Abnormal Lab/Test Results:  08/02/15: Na= 132; 08/04/15: Na= 133  Possible Clinical Conditions associated with below indicators   Hyponatremia  Other Condition  Cannot Clinically Determine  Treatment Provided: IVF's  Please exercise your independent, professional judgment when responding. A specific answer is not anticipated or expected.   Thank You,  Rolm Gala, RN, [place] [phone]

## 2015-08-05 ENCOUNTER — Other Ambulatory Visit: Payer: Self-pay | Admitting: Family

## 2015-08-05 DIAGNOSIS — Z48812 Encounter for surgical aftercare following surgery on the circulatory system: Secondary | ICD-10-CM

## 2015-08-05 DIAGNOSIS — I739 Peripheral vascular disease, unspecified: Secondary | ICD-10-CM

## 2015-08-14 ENCOUNTER — Other Ambulatory Visit: Payer: Self-pay | Admitting: Gastroenterology

## 2015-08-15 ENCOUNTER — Emergency Department (HOSPITAL_COMMUNITY)
Admission: EM | Admit: 2015-08-15 | Discharge: 2015-08-16 | Disposition: A | Discharge status: Home / Self Care | Payer: Medicare Other | Attending: Emergency Medicine | Admitting: Emergency Medicine

## 2015-08-15 ENCOUNTER — Emergency Department (HOSPITAL_COMMUNITY): Payer: Medicare Other

## 2015-08-15 ENCOUNTER — Encounter (HOSPITAL_COMMUNITY): Payer: Self-pay | Admitting: *Deleted

## 2015-08-15 DIAGNOSIS — M5442 Lumbago with sciatica, left side: Secondary | ICD-10-CM | POA: Insufficient documentation

## 2015-08-15 DIAGNOSIS — E785 Hyperlipidemia, unspecified: Secondary | ICD-10-CM | POA: Insufficient documentation

## 2015-08-15 DIAGNOSIS — I10 Essential (primary) hypertension: Secondary | ICD-10-CM | POA: Insufficient documentation

## 2015-08-15 DIAGNOSIS — M545 Low back pain: Secondary | ICD-10-CM | POA: Diagnosis present

## 2015-08-15 DIAGNOSIS — R2242 Localized swelling, mass and lump, left lower limb: Secondary | ICD-10-CM | POA: Insufficient documentation

## 2015-08-15 DIAGNOSIS — Z79899 Other long term (current) drug therapy: Secondary | ICD-10-CM | POA: Diagnosis not present

## 2015-08-15 DIAGNOSIS — M109 Gout, unspecified: Secondary | ICD-10-CM | POA: Insufficient documentation

## 2015-08-15 DIAGNOSIS — Z720 Tobacco use: Secondary | ICD-10-CM | POA: Insufficient documentation

## 2015-08-15 DIAGNOSIS — M1120 Other chondrocalcinosis, unspecified site: Secondary | ICD-10-CM

## 2015-08-15 DIAGNOSIS — M1990 Unspecified osteoarthritis, unspecified site: Secondary | ICD-10-CM | POA: Insufficient documentation

## 2015-08-15 DIAGNOSIS — F419 Anxiety disorder, unspecified: Secondary | ICD-10-CM | POA: Insufficient documentation

## 2015-08-15 LAB — CBC WITH DIFFERENTIAL/PLATELET
Basophils Absolute: 0 10*3/uL (ref 0.0–0.1)
Basophils Absolute: 0.1 10*3/uL (ref 0.0–0.1)
Basophils Relative: 1 %
Basophils Relative: 1 %
Eosinophils Absolute: 0.1 10*3/uL (ref 0.0–0.7)
Eosinophils Absolute: 0.2 10*3/uL (ref 0.0–0.7)
Eosinophils Relative: 2 %
Eosinophils Relative: 2 %
HCT: 13.8 % — ABNORMAL LOW (ref 36.0–46.0)
HCT: 28.8 % — ABNORMAL LOW (ref 36.0–46.0)
Hemoglobin: 4.8 g/dL — CL (ref 12.0–15.0)
Hemoglobin: 9.8 g/dL — ABNORMAL LOW (ref 12.0–15.0)
Lymphocytes Relative: 17 %
Lymphocytes Relative: 23 %
Lymphs Abs: 1.9 10*3/uL (ref 0.7–4.0)
Lymphs Abs: 2 10*3/uL (ref 0.7–4.0)
MCH: 27.5 pg (ref 26.0–34.0)
MCH: 28.2 pg (ref 26.0–34.0)
MCHC: 34 g/dL (ref 30.0–36.0)
MCHC: 34.8 g/dL (ref 30.0–36.0)
MCV: 80.7 fL (ref 78.0–100.0)
MCV: 81.2 fL (ref 78.0–100.0)
Monocytes Absolute: 0.4 10*3/uL (ref 0.1–1.0)
Monocytes Absolute: 0.7 10*3/uL (ref 0.1–1.0)
Monocytes Relative: 5 %
Monocytes Relative: 6 %
Neutro Abs: 5.9 10*3/uL (ref 1.7–7.7)
Neutro Abs: 8.7 10*3/uL — ABNORMAL HIGH (ref 1.7–7.7)
Neutrophils Relative %: 70 %
Neutrophils Relative %: 74 %
Platelets: 396 10*3/uL (ref 150–400)
Platelets: 578 10*3/uL — ABNORMAL HIGH (ref 150–400)
RBC: 1.7 MIL/uL — ABNORMAL LOW (ref 3.87–5.11)
RBC: 3.57 MIL/uL — ABNORMAL LOW (ref 3.87–5.11)
RDW: 15.8 % — ABNORMAL HIGH (ref 11.5–15.5)
RDW: 16.2 % — ABNORMAL HIGH (ref 11.5–15.5)
WBC: 11.7 10*3/uL — ABNORMAL HIGH (ref 4.0–10.5)
WBC: 8.5 10*3/uL (ref 4.0–10.5)

## 2015-08-15 LAB — I-STAT CHEM 8, ED
BUN: 32 mg/dL — ABNORMAL HIGH (ref 6–20)
Calcium, Ion: 1.16 mmol/L (ref 1.13–1.30)
Chloride: 106 mmol/L (ref 101–111)
Creatinine, Ser: 1.5 mg/dL — ABNORMAL HIGH (ref 0.44–1.00)
Glucose, Bld: 124 mg/dL — ABNORMAL HIGH (ref 65–99)
HCT: 23 % — ABNORMAL LOW (ref 36.0–46.0)
Hemoglobin: 7.8 g/dL — ABNORMAL LOW (ref 12.0–15.0)
Potassium: 4.8 mmol/L (ref 3.5–5.1)
Sodium: 135 mmol/L (ref 135–145)
TCO2: 18 mmol/L (ref 0–100)

## 2015-08-15 LAB — SYNOVIAL CELL COUNT + DIFF, W/ CRYSTALS
Eosinophils-Synovial: 0 % (ref 0–1)
Lymphocytes-Synovial Fld: 0 % (ref 0–20)
Monocyte-Macrophage-Synovial Fluid: 26 % — ABNORMAL LOW (ref 50–90)
Neutrophil, Synovial: 74 % — ABNORMAL HIGH (ref 0–25)
WBC, Synovial: 8036 /mm3 — ABNORMAL HIGH (ref 0–200)

## 2015-08-15 LAB — BASIC METABOLIC PANEL
Anion gap: 11 (ref 5–15)
BUN: 33 mg/dL — ABNORMAL HIGH (ref 6–20)
CO2: 21 mmol/L — ABNORMAL LOW (ref 22–32)
Calcium: 9.5 mg/dL (ref 8.9–10.3)
Chloride: 106 mmol/L (ref 101–111)
Creatinine, Ser: 1.57 mg/dL — ABNORMAL HIGH (ref 0.44–1.00)
GFR calc Af Amer: 37 mL/min — ABNORMAL LOW (ref >60)
GFR calc non Af Amer: 32 mL/min — ABNORMAL LOW (ref >60)
Glucose, Bld: 132 mg/dL — ABNORMAL HIGH (ref 65–99)
Potassium: 4.8 mmol/L (ref 3.5–5.1)
Sodium: 138 mmol/L (ref 135–145)

## 2015-08-15 LAB — SEDIMENTATION RATE: Sed Rate: 87 mm/hr — ABNORMAL HIGH (ref 0–22)

## 2015-08-15 LAB — GRAM STAIN

## 2015-08-15 MED ORDER — LIDOCAINE HCL (PF) 2 % IJ SOLN
10.0000 mL | Freq: Once | INTRAMUSCULAR | Status: AC
  Administered 2015-08-15: 10 mL
  Filled 2015-08-15: qty 10

## 2015-08-15 MED ORDER — MORPHINE SULFATE (PF) 4 MG/ML IV SOLN
4.0000 mg | Freq: Once | INTRAVENOUS | Status: AC
  Administered 2015-08-15: 4 mg via INTRAMUSCULAR

## 2015-08-15 MED ORDER — MORPHINE SULFATE (PF) 4 MG/ML IV SOLN
4.0000 mg | Freq: Once | INTRAVENOUS | Status: DC
  Filled 2015-08-15: qty 1

## 2015-08-15 MED ORDER — OXYCODONE HCL 10 MG PO TABS: 10.0000 mg | ORAL_TABLET | Freq: Two times a day (BID) | ORAL | Status: DC | PRN

## 2015-08-15 MED ORDER — PREDNISONE 20 MG PO TABS: ORAL_TABLET | ORAL | Status: DC

## 2015-08-15 NOTE — ED Notes (Signed)
Patient transported to X-ray 

## 2015-08-15 NOTE — ED Provider Notes (Signed)
CSN: [id number]     Arrival date & time 08/15/15  1934 History   First MD Initiated Contact with Patient 08/15/15 1956     Chief Complaint  Patient presents with  . Back Pain     (Consider location/radiation/quality/duration/timing/severity/associated sxs/prior Treatment) The history is provided by the patient.  Alyssa [name] is a 74 y.o. female hx of HL, HTN, here with back pain, left knee swelling. Patient had L4-5 fusion done two weeks ago with Dr. Saintclair Halsted. She was discharged with steroids and oxycodone but ran out yesterday. She had foot drop before the surgery and hasn't improved. States that she had worsening l knee swelling for the last 3 days. Denies fevers, has pain with ambulation.    Past Medical History  Diagnosis Date  . Hyperlipidemia   . Hypertension   . Leg pain   . Joint pain   . Headache(784.0)   . Anxiety   . Arthritis   . Peripheral vascular disease [facility])    Past Surgical History  Procedure Laterality Date  . Total hip arthroplasty      right  . 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 Joint replacement Right 2012    Hip  . Abdominal angiogram N/A 10/29/2011    Procedure: ABDOMINAL ANGIOGRAM;  Surgeon: Elam Dutch, MD;  Location[facility] CATH LAB;  Service: Cardiovascular;  Laterality: N/A;   Family History  Problem Relation Age of Onset  . Heart attack Mother   . Heart disease Mother   . Hyperlipidemia Mother   . Hypertension Mother   . Heart attack Father   . Heart disease Father     Before age 26  . Hyperlipidemia Father   . Hypertension Father   . Cancer Sister   . Aneurysm Brother   . Cancer Brother    Social History  Substance Use Topics  . Smoking status: Current Every Day Smoker -- 0.50 packs/day    Types: Cigarettes    Last Attempt to Quit: 01/27/2014  . Smokeless tobacco: Never Used  . Alcohol Use: No   OB History    No data available     Review of Systems  Musculoskeletal: Positive for back pain.       L knee pain    All other systems reviewed and are negative.     Allergies  Review of patient's allergies indicates no known allergies.  Home Medications   Prior to Admission medications   Medication Sig Start Date End Date Taking? Authorizing Provider  Amlodipine-Valsartan-HCTZ 10-320-25 MG TABS Take 1 tablet by mouth daily.   Yes Historical Provider, MD  aspirin-acetaminophen-caffeine (EXCEDRIN MIGRAINE) [phone] MG per tablet Take 1 tablet by mouth every 6 (six) hours as needed for headache.   Yes Historical Provider, MD  Aspirin-Acetaminophen-Caffeine (GOODY HEADACHE PO) Take 1 Package by mouth daily as needed (or headaches).   Yes Historical Provider, MD  citalopram (CELEXA) 40 MG tablet Take 40 mg by mouth daily. 05/07/15  Yes Historical Provider, MD  ferrous sulfate 325 (65 FE) MG tablet Take 325 mg by mouth daily with breakfast.   Yes Historical Provider, MD  Multiple Vitamins-Calcium (ONE-A-DAY WOMENS PO) Take 1 tablet by mouth daily.   Yes Historical Provider, MD  rosuvastatin (CRESTOR) 10 MG tablet Take 10 mg by mouth every morning.    Yes Historical Provider, MD  traZODone (DESYREL) 50 MG tablet Take 50 mg by mouth at bedtime.   Yes Historical Provider, MD  Oxycodone HCl 10 MG TABS Take 10  mg by mouth daily as needed (pain).     Historical Provider, MD   BP 125/76 mmHg  Pulse 81  Temp(Src) 98.4 F (36.9 C) (Oral)  Resp 16  SpO2 100% Physical Exam  Constitutional: She is oriented to person, place, and time. She appears well-developed.  Chronically ill, uncomfortable   HENT:  Head: Normocephalic.  Eyes: Pupils are equal, round, and reactive to light.  Neck: Normal range of motion.  Cardiovascular: Normal rate, regular rhythm and normal heart sounds.   Pulmonary/Chest: Effort normal and breath sounds normal. No respiratory distress. She has no wheezes. She has no rales.  Abdominal: Soft. Bowel sounds are normal. She exhibits no distension. There is no tenderness. There is no rebound.   Musculoskeletal:  Surgical scars in lumbar area healing well, minimally swollen. No calf tenderness   Neurological: She is alert and oriented to person, place, and time.  CN 2-12 intact, L foot drop (chronic). L knee swollen, dec ROM, not red or hot. Slightly dec sensation L leg, + straight leg raise L side. Nl reflexes.   Skin: Skin is warm and dry.  Psychiatric: She has a normal mood and affect. Her behavior is normal. Judgment and thought content normal.  Nursing note and vitals reviewed.   ED Course  .Joint Aspiration/Arthrocentesis Date/Time: 08/15/2015 11:18 PM Performed by: Wandra Arthurs Authorized by: Wandra Arthurs Consent: Verbal consent obtained. Risks and benefits: risks, benefits and alternatives were discussed Consent given by: patient Patient understanding: patient states understanding of the procedure being performed Patient consent: the patient's understanding of the procedure matches consent given Procedure consent: procedure consent matches procedure scheduled Relevant documents: relevant documents present and verified Patient identity confirmed: verbally with patient Indications: joint swelling  Body area: knee Joint: left knee Local anesthesia used: yes Local anesthetic: lidocaine 2% without epinephrine Patient sedated: no Preparation: Patient was prepped and draped in the usual sterile fashion. Needle gauge: 18 G Ultrasound guidance: no Approach: medial Aspirate: serous Aspirate amount: 70 mL Patient tolerance: Patient tolerated the procedure well with no immediate complications   (including critical care time)   Labs Review Labs Reviewed  CBC WITH DIFFERENTIAL/PLATELET - Abnormal; Notable for the following:    WBC 11.7 (*)    RBC 1.70 (*)    Hemoglobin 4.8 (*)    HCT 13.8 (*)    RDW 16.2 (*)    Platelets 578 (*)    Neutro Abs 8.7 (*)    All other components within normal limits  BASIC METABOLIC PANEL - Abnormal; Notable for the following:     CO2 21 (*)    Glucose, Bld 132 (*)    BUN 33 (*)    Creatinine, Ser 1.57 (*)    GFR calc non Af Amer 32 (*)    GFR calc Af Amer 37 (*)    All other components within normal limits  SEDIMENTATION RATE - Abnormal; Notable for the following:    Sed Rate 87 (*)    All other components within normal limits  SYNOVIAL CELL COUNT + DIFF, W/ CRYSTALS - Abnormal; Notable for the following:    Appearance-Synovial TURBID (*)    WBC, Synovial 8036 (*)    Neutrophil, Synovial 74 (*)    Monocyte-Macrophage-Synovial Fluid 26 (*)    All other components within normal limits  CBC WITH DIFFERENTIAL/PLATELET - Abnormal; Notable for the following:    RBC 3.57 (*)    Hemoglobin 9.8 (*)    HCT 28.8 (*)  RDW 15.8 (*)    All other components within normal limits  I-STAT CHEM 8, ED - Abnormal; Notable for the following:    BUN 32 (*)    Creatinine, Ser 1.50 (*)    Glucose, Bld 124 (*)    Hemoglobin 7.8 (*)    HCT 23.0 (*)    All other components within normal limits  CULTURE, BODY FLUID-BOTTLE  GRAM STAIN  GLUCOSE, SYNOVIAL FLUID  PROTEIN, SYNOVIAL FLUID  URIC ACID, SYNOVIAL FLUID    Imaging Review Dg Knee Complete 4 Views Left  08/15/2015  CLINICAL DATA:  Left knee pain/ swelling, chronic deformity, status post fluid removal EXAM: LEFT KNEE - COMPLETE 4+ VIEW COMPARISON:  02/04/2015 FINDINGS: No fracture or dislocation is seen. 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 Associated chondrocalcinosis in the medial and lateral compartments. Moderate suprapatellar knee joint effusion. 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 Moderate suprapatellar knee joint effusion. No fracture or dislocation is seen. Electronically Signed   By: Julian Hy M.D.   On: 08/15/2015 22:45   I have personally reviewed and evaluated these images and lab results as part of my medical decision-making.   EKG  Interpretation None      MDM   Final diagnoses:  None    Alyssa [name] is a 74 y.o. female here with L knee swelling, back pain. Worsening pain likely from knee effusion. Has good range of motion so not a high suspicion for septic knee. Will get ESR, arthrocentesis. Foot drop is old, has some numbness but no incontinence or weakness to suggest spinal compression.   11:21 PM Arthrocentesis done, took out 70 cc clear fluid. Has 8000 WBC, + calcium phosphate crystals. ESR 87. Consulted Dr. Erlinda Hong from ortho, who feels that elevated ESR likely from pseudogout and patient unlikely going to have septic knee. WBC nl. Xray showed arthritis. Pain improved with pain meds. Will dc home with steroids, oxycodone.      Wandra Arthurs, MD 08/15/15 [id number]

## 2015-08-15 NOTE — ED Notes (Signed)
Lab back at the bedside.

## 2015-08-15 NOTE — Discharge Instructions (Signed)
Take prednisone as prescribed.  Take oxycodone for pain.   See your neurosurgeon for follow up.  See orthopedic doctor about your knee.   Keep your knee wrapped to keep the knee swelling from coming back.  Return to ER if you have worse pain, swelling, weakness, numbness   Calcium Pyrophosphate Deposition  Calcium pyrophosphate deposition (CPPD), which is also called pseudogout, is a type of arthritis that causes pain, swelling, and inflammation in a joint. The joint pain can be severe and may last for days. If it is not treated, the pain may last much longer. Attacks of CPPD may come and go. This condition usually affects one joint at a time. The joints that are affected most commonly are the knees, but this condition can also affect the wrists, elbows, shoulders, or ankles. CPPD is similar to gout. Both conditions result from the buildup of crystals in the joint. However, CPPD is caused by a type of crystal that is different than the crystals that cause gout. CAUSES This condition is caused by the buildup of calcium pyrophosphate dihydrate crystals in the joint. The reason why this buildup occurs is not known. The condition may be passed down from parent to child (hereditary). RISK FACTORS This condition is more likely to develop in people who:  Are over 39 years old.  Have a family history of the condition.  Have had joint replacement surgery.  Have had a recent injury.  Have certain medical conditions, such as hemophilia, ochronosis, amyloidosis, or hormonal disorders.  Have low blood magnesium levels. SYMPTOMS Symptoms of this condition include:  Pain in a joint. The pain may:  Be intense and constant.  Come on quickly.  Get worse with movement.  Last from several days to a few weeks.  Redness, swelling, and warmth at the joint.  Stiffness of the joint. DIAGNOSIS To diagnose this condition, your health care provider will use a needle to remove fluid from the  joint. The fluid will be examined under a microscope to check for the crystals that cause CPPD. You may also have imaging tests, such as:  X-rays.  Ultrasound. TREATMENT There is no way to remove the crystals from the joint and no way to cure this condition. However, treatment can relieve symptoms and improve joint function. Treatment may include:  Nonsteroidal anti-inflammatory drugs (NSAIDs) to reduce inflammation and pain.  Medicines to help prevent attacks.  Injections of medicine (cortisone) into the joint to reduce pain and swelling.  Physical therapy to improve joint function. HOME CARE INSTRUCTIONS  Take medicines only as directed by your health care provider.  Rest the affected joints until your symptoms start to go away.  Keep your affected joints raised (elevated) when possible. This will help to reduce swelling.  If directed, apply ice to the affected area:  Put ice in a plastic bag.  Place a towel between your skin and the bag.  Leave the ice on for 20 minutes, 2-3 times per day.  If the painful joint is in your leg, use crutches as directed by your health care provider.  When your symptoms start to go away, begin to exercise regularly or do physical therapy. Talk with your health care provider or physical therapist about what types of exercise are safe for you. Low-impact exercise may be best. This includes walking, swimming, bicycling, and water aerobics.  Maintain a healthy weight so your joints do not need to bear more weight than necessary. SEEK MEDICAL CARE IF:  You have an  increase in joint pain that is not relieved with medicine.  Your joint becomes more red, swollen, or stiff.  You have a fever.  You have a skin rash.   This information is not intended to replace advice given to you by your health care provider. Make sure you discuss any questions you have with your health care provider.   Document Released: 06/26/2004 Document Revised: 02/18/2015  Document Reviewed: 09/11/2014 Elsevier Interactive Patient Education [employer].

## 2015-08-15 NOTE — ED Notes (Signed)
Phlebotomy at the bedside  

## 2015-08-15 NOTE — ED Notes (Signed)
THE PT IS C/O BACK PAIN AFTER SHE HAD HER SURGERY LAST Tuesday ON HER LOWER BACK

## 2015-08-16 LAB — GLUCOSE, SYNOVIAL FLUID: Glucose, Synovial Fluid: 99 mg/dL

## 2015-08-16 LAB — PROTEIN, SYNOVIAL FLUID: Protein, Synovial Fluid: 3.7 g/dL — ABNORMAL HIGH (ref 1.0–3.0)

## 2015-08-16 LAB — URIC ACID, SYNOVIAL FLUID: Uric Acid, Synovial Fluid: 8.6 mg/dL — ABNORMAL HIGH (ref 0.0–6.0)

## 2015-08-20 ENCOUNTER — Other Ambulatory Visit (HOSPITAL_COMMUNITY): Payer: Self-pay | Admitting: Gastroenterology

## 2015-08-20 DIAGNOSIS — R1011 Right upper quadrant pain: Secondary | ICD-10-CM

## 2015-08-20 LAB — CULTURE, BODY FLUID-BOTTLE

## 2015-08-20 LAB — CULTURE, BODY FLUID W GRAM STAIN -BOTTLE: Culture: NO GROWTH

## 2015-08-24 ENCOUNTER — Emergency Department (HOSPITAL_COMMUNITY)
Admission: EM | Admit: 2015-08-24 | Discharge: 2015-08-24 | Disposition: A | Discharge status: Home / Self Care | Payer: Medicare Other | Attending: Emergency Medicine | Admitting: Emergency Medicine

## 2015-08-24 ENCOUNTER — Emergency Department (HOSPITAL_COMMUNITY): Payer: Medicare Other

## 2015-08-24 ENCOUNTER — Encounter (HOSPITAL_COMMUNITY): Payer: Self-pay | Admitting: *Deleted

## 2015-08-24 DIAGNOSIS — M25562 Pain in left knee: Secondary | ICD-10-CM | POA: Insufficient documentation

## 2015-08-24 DIAGNOSIS — M545 Low back pain: Secondary | ICD-10-CM | POA: Insufficient documentation

## 2015-08-24 DIAGNOSIS — R630 Anorexia: Secondary | ICD-10-CM | POA: Insufficient documentation

## 2015-08-24 DIAGNOSIS — F419 Anxiety disorder, unspecified: Secondary | ICD-10-CM | POA: Insufficient documentation

## 2015-08-24 DIAGNOSIS — E785 Hyperlipidemia, unspecified: Secondary | ICD-10-CM | POA: Diagnosis not present

## 2015-08-24 DIAGNOSIS — Z79899 Other long term (current) drug therapy: Secondary | ICD-10-CM | POA: Insufficient documentation

## 2015-08-24 DIAGNOSIS — I10 Essential (primary) hypertension: Secondary | ICD-10-CM | POA: Insufficient documentation

## 2015-08-24 DIAGNOSIS — Z720 Tobacco use: Secondary | ICD-10-CM | POA: Insufficient documentation

## 2015-08-24 DIAGNOSIS — R531 Weakness: Secondary | ICD-10-CM | POA: Diagnosis not present

## 2015-08-24 DIAGNOSIS — F332 Major depressive disorder, recurrent severe without psychotic features: Secondary | ICD-10-CM | POA: Diagnosis not present

## 2015-08-24 DIAGNOSIS — Z7952 Long term (current) use of systemic steroids: Secondary | ICD-10-CM | POA: Insufficient documentation

## 2015-08-24 DIAGNOSIS — M1990 Unspecified osteoarthritis, unspecified site: Secondary | ICD-10-CM | POA: Diagnosis not present

## 2015-08-24 DIAGNOSIS — D649 Anemia, unspecified: Secondary | ICD-10-CM | POA: Insufficient documentation

## 2015-08-24 LAB — URINALYSIS, ROUTINE W REFLEX MICROSCOPIC
Bilirubin Urine: NEGATIVE
Glucose, UA: NEGATIVE mg/dL
Hgb urine dipstick: NEGATIVE
Ketones, ur: NEGATIVE mg/dL
Leukocytes, UA: NEGATIVE
Nitrite: NEGATIVE
Protein, ur: NEGATIVE mg/dL
Specific Gravity, Urine: 1.014 (ref 1.005–1.030)
Urobilinogen, UA: 0.2 mg/dL (ref 0.0–1.0)
pH: 5.5 (ref 5.0–8.0)

## 2015-08-24 LAB — CBG MONITORING, ED: Glucose-Capillary: 86 mg/dL (ref 65–99)

## 2015-08-24 LAB — BASIC METABOLIC PANEL
Anion gap: 15 (ref 5–15)
BUN: 36 mg/dL — ABNORMAL HIGH (ref 6–20)
CO2: 20 mmol/L — ABNORMAL LOW (ref 22–32)
Calcium: 9.8 mg/dL (ref 8.9–10.3)
Chloride: 103 mmol/L (ref 101–111)
Creatinine, Ser: 1.3 mg/dL — ABNORMAL HIGH (ref 0.44–1.00)
GFR calc Af Amer: 46 mL/min — ABNORMAL LOW (ref >60)
GFR calc non Af Amer: 40 mL/min — ABNORMAL LOW (ref >60)
Glucose, Bld: 169 mg/dL — ABNORMAL HIGH (ref 65–99)
Potassium: 4.3 mmol/L (ref 3.5–5.1)
Sodium: 138 mmol/L (ref 135–145)

## 2015-08-24 LAB — CBC
HCT: 30.2 % — ABNORMAL LOW (ref 36.0–46.0)
Hemoglobin: 10.1 g/dL — ABNORMAL LOW (ref 12.0–15.0)
MCH: 27.3 pg (ref 26.0–34.0)
MCHC: 33.4 g/dL (ref 30.0–36.0)
MCV: 81.6 fL (ref 78.0–100.0)
Platelets: 869 10*3/uL — ABNORMAL HIGH (ref 150–400)
RBC: 3.7 MIL/uL — ABNORMAL LOW (ref 3.87–5.11)
RDW: 17.2 % — ABNORMAL HIGH (ref 11.5–15.5)
WBC: 8.6 10*3/uL (ref 4.0–10.5)

## 2015-08-24 MED ORDER — MORPHINE SULFATE (PF) 4 MG/ML IV SOLN
4.0000 mg | Freq: Once | INTRAVENOUS | Status: AC
  Administered 2015-08-24: 4 mg via INTRAVENOUS
  Filled 2015-08-24: qty 1

## 2015-08-24 MED ORDER — SODIUM CHLORIDE 0.9 % IV SOLN
INTRAVENOUS | Status: DC
  Administered 2015-08-24: 16:00:00 via INTRAVENOUS

## 2015-08-24 NOTE — ED Notes (Signed)
MD at bedside. 

## 2015-08-24 NOTE — ED Provider Notes (Addendum)
CSN: [id number]     Arrival date & time 08/24/15  1508 History   First MD Initiated Contact with Patient 08/24/15 1600     Chief Complaint  Patient presents with  . Weakness     (Consider location/radiation/quality/duration/timing/severity/associated sxs/prior Treatment) HPI   74 year old female with generalized weakness. Worsening over the past 1-2 days. Feels very tired. She is status post lumbar surgery on 10/14. She reports some lower back pain, but this is improving since surgery. She is a left foot drop which is chronic. Seen in the emergency room on 10/28 left knee pain secondary to pseudogout. She is having ongoing knee pain since then although improved since last seen. No fevers or chills. Nausea and vomited once last night. Anorexia. Denies any abdominal pain. No chest pain some shortness of breath. No cough. Denies history of DVT/PE. No blood thinners. Reports history of anemia. No bright red blood per rectum  Past Medical History  Diagnosis Date  . Hyperlipidemia   . Hypertension   . Leg pain   . Joint pain   . Headache(784.0)   . Anxiety   . Arthritis   . Peripheral vascular disease [facility])    Past Surgical History  Procedure Laterality Date  . Total hip arthroplasty      right  . 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 Joint replacement Right 2012    Hip  . Abdominal angiogram N/A 10/29/2011    Procedure: ABDOMINAL ANGIOGRAM;  Surgeon: Elam Dutch, MD;  Location: [facility] CATH LAB;  Service: Cardiovascular;  Laterality: N/A;   Family History  Problem Relation Age of Onset  . Heart attack Mother   . Heart disease Mother   . Hyperlipidemia Mother   . Hypertension Mother   . Heart attack Father   . Heart disease Father     Before age 48  . Hyperlipidemia Father   . Hypertension Father   . Cancer Sister   . Aneurysm Brother   . Cancer Brother    Social History  Substance Use Topics  . Smoking status: Current Every Day Smoker -- 0.50 packs/day    Types:  Cigarettes    Last Attempt to Quit: 01/27/2014  . Smokeless tobacco: Never Used  . Alcohol Use: No   OB History    No data available     Review of Systems  All systems reviewed and negative, other than as noted in HPI.   Allergies  Review of patient's allergies indicates no known allergies.  Home Medications   Prior to Admission medications   Medication Sig Start Date End Date Taking? Authorizing Provider  Amlodipine-Valsartan-HCTZ 10-320-25 MG TABS Take 1 tablet by mouth daily.    Historical Provider, MD  aspirin-acetaminophen-caffeine (EXCEDRIN MIGRAINE) [phone] MG per tablet Take 1 tablet by mouth every 6 (six) hours as needed for headache.    Historical Provider, MD  Aspirin-Acetaminophen-Caffeine (GOODY HEADACHE PO) Take 1 Package by mouth daily as needed (or headaches).    Historical Provider, MD  citalopram (CELEXA) 40 MG tablet Take 40 mg by mouth daily. 05/07/15   Historical Provider, MD  ferrous sulfate 325 (65 FE) MG tablet Take 325 mg by mouth daily with breakfast.    Historical Provider, MD  Multiple Vitamins-Calcium (ONE-A-DAY WOMENS PO) Take 1 tablet by mouth daily.    Historical Provider, MD  Oxycodone HCl 10 MG TABS Take 1 tablet (10 mg total) by mouth 2 (two) times daily as needed (pain). 08/15/15   Wandra Arthurs, MD  predniSONE (DELTASONE) 20 MG tablet Take 60 mg daily x 2 days then 40 mg daily x 2 days then 20 mg daily x 2 days 08/15/15   Wandra Arthurs, MD  rosuvastatin (CRESTOR) 10 MG tablet Take 10 mg by mouth every morning.     Historical Provider, MD  traZODone (DESYREL) 50 MG tablet Take 50 mg by mouth at bedtime.    Historical Provider, MD   BP 159/94 mmHg  Pulse 84  Temp(Src) 98 F (36.7 C) (Oral)  Resp 32  Ht 5\' 3"  (1.6 m)  Wt 92 lb (41.731 kg)  BMI 16.30 kg/m2  SpO2 100% Physical Exam  Constitutional: She appears well-developed and well-nourished. No distress.  Laying in bed awake. Frail-appearing.  HENT:  Head: Normocephalic and atraumatic.   Eyes: Conjunctivae are normal. Right eye exhibits no discharge. Left eye exhibits no discharge.  Neck: Neck supple.  Cardiovascular: Normal rate, regular rhythm and normal heart sounds.  Exam reveals no gallop and no friction rub.   No murmur heard. Pulmonary/Chest: Effort normal and breath sounds normal. No respiratory distress.  Abdominal: Soft. She exhibits no distension. There is no tenderness.  Musculoskeletal: She exhibits no edema or tenderness.  Length discrepancy of lower extremities. Able to actively range both hips and knees but complains of some increased back pain with flexion of either hip. She reports that this is relatively stable though. Neurovascularly intact distally aside from the left foot drop.  Neurological: She is alert.  Skin: Skin is warm and dry.  Midline lumbar incision appears to be healing well. No signifcant local tenderness, erythema or concerning skin changes.  Psychiatric: She has a normal mood and affect. Her behavior is normal. Thought content normal.  Nursing note and vitals reviewed.   ED Course  Procedures (including critical care time) Labs Review Labs Reviewed  BASIC METABOLIC PANEL - Abnormal; Notable for the following:    CO2 20 (*)    Glucose, Bld 169 (*)    BUN 36 (*)    Creatinine, Ser 1.30 (*)    GFR calc non Af Amer 40 (*)    GFR calc Af Amer 46 (*)    All other components within normal limits  CBC - Abnormal; Notable for the following:    RBC 3.70 (*)    Hemoglobin 10.1 (*)    HCT 30.2 (*)    RDW 17.2 (*)    Platelets 869 (*)    All other components within normal limits  URINALYSIS, ROUTINE W REFLEX MICROSCOPIC (NOT AT [facility]) - Abnormal; Notable for the following:    APPearance CLOUDY (*)    All other components within normal limits  CBG MONITORING, ED    Imaging Review No results found. I have personally reviewed and evaluated these images and lab results as part of my medical decision-making.   EKG  Interpretation   Date/Time:  Sunday August 24 2015 15:56:11 EST Ventricular Rate:  82 PR Interval:  163 QRS Duration: 82 QT Interval:  377 QTC Calculation: 440 R Axis:   80 Text Interpretation:  Sinus rhythm Left ventricular hypertrophy No  significant change since last tracing Confirmed by Brock  ([id number]) on 08/24/2015 4:03:09 PM      MDM   Final diagnoses:  Generalized weakness    74 year old female with generalized weakness. Suspect deconditioning related to recent surgery and hospitalization. Doubt surgical complication or other emergent process. She is anemic, but this appears improved from baseline. Thrombocytosis, but no acute neurological  deficits to suggest CVA. Renal function is stable. She is afebrile. Hemodynamically stable. UA not consistent with UTI. I feel she may potentially benefit from home health services for physical therapy. Unfortunately, care management is not available at this time to discuss further. This can be pursued as an outpatient.     Virgel Manifold, MD 08/24/15 [phone]

## 2015-08-24 NOTE — ED Notes (Addendum)
Family at bedside. 

## 2015-08-24 NOTE — Discharge Instructions (Signed)

## 2015-08-24 NOTE — ED Notes (Signed)
Off unit with XRAy

## 2015-08-24 NOTE — ED Notes (Signed)
Pt in from home c/o generalized weakness worse in the lower bil extremities, denies CP, n/v/d, c/o SOB, reports feeling dizzy, A&O x4, recent back surgery, follows commands, speaks in complete sentences

## 2015-08-26 ENCOUNTER — Encounter (HOSPITAL_COMMUNITY): Payer: Self-pay

## 2015-08-26 ENCOUNTER — Emergency Department (HOSPITAL_COMMUNITY)
Admission: EM | Admit: 2015-08-26 | Discharge: 2015-08-27 | Disposition: A | Discharge status: Home / Self Care | Payer: Medicare Other | Source: Home / Self Care | Attending: Emergency Medicine | Admitting: Emergency Medicine

## 2015-08-26 DIAGNOSIS — F32A Depression, unspecified: Secondary | ICD-10-CM

## 2015-08-26 DIAGNOSIS — F329 Major depressive disorder, single episode, unspecified: Secondary | ICD-10-CM

## 2015-08-26 DIAGNOSIS — R531 Weakness: Secondary | ICD-10-CM | POA: Diagnosis not present

## 2015-08-26 LAB — COMPREHENSIVE METABOLIC PANEL
ALT: 12 U/L — ABNORMAL LOW (ref 14–54)
AST: 19 U/L (ref 15–41)
Albumin: 3.3 g/dL — ABNORMAL LOW (ref 3.5–5.0)
Alkaline Phosphatase: 123 U/L (ref 38–126)
Anion gap: 10 (ref 5–15)
BUN: 35 mg/dL — ABNORMAL HIGH (ref 6–20)
CO2: 22 mmol/L (ref 22–32)
Calcium: 9.1 mg/dL (ref 8.9–10.3)
Chloride: 106 mmol/L (ref 101–111)
Creatinine, Ser: 1.31 mg/dL — ABNORMAL HIGH (ref 0.44–1.00)
GFR calc Af Amer: 46 mL/min — ABNORMAL LOW (ref >60)
GFR calc non Af Amer: 40 mL/min — ABNORMAL LOW (ref >60)
Glucose, Bld: 158 mg/dL — ABNORMAL HIGH (ref 65–99)
Potassium: 4.6 mmol/L (ref 3.5–5.1)
Sodium: 138 mmol/L (ref 135–145)
Total Bilirubin: <0.1 mg/dL — ABNORMAL LOW (ref 0.3–1.2)
Total Protein: 6.4 g/dL — ABNORMAL LOW (ref 6.5–8.1)

## 2015-08-26 LAB — CBC WITH DIFFERENTIAL/PLATELET
Basophils Absolute: 0 10*3/uL (ref 0.0–0.1)
Basophils Relative: 0 %
Eosinophils Absolute: 0.2 10*3/uL (ref 0.0–0.7)
Eosinophils Relative: 2 %
HCT: 28.8 % — ABNORMAL LOW (ref 36.0–46.0)
Hemoglobin: 9.4 g/dL — ABNORMAL LOW (ref 12.0–15.0)
Lymphocytes Relative: 22 %
Lymphs Abs: 2 10*3/uL (ref 0.7–4.0)
MCH: 26.9 pg (ref 26.0–34.0)
MCHC: 32.6 g/dL (ref 30.0–36.0)
MCV: 82.3 fL (ref 78.0–100.0)
Monocytes Absolute: 0.7 10*3/uL (ref 0.1–1.0)
Monocytes Relative: 8 %
Neutro Abs: 6.1 10*3/uL (ref 1.7–7.7)
Neutrophils Relative %: 68 %
Platelets: 753 10*3/uL — ABNORMAL HIGH (ref 150–400)
RBC: 3.5 MIL/uL — ABNORMAL LOW (ref 3.87–5.11)
RDW: 17.5 % — ABNORMAL HIGH (ref 11.5–15.5)
WBC: 9 10*3/uL (ref 4.0–10.5)

## 2015-08-26 LAB — ETHANOL: Alcohol, Ethyl (B): <5 mg/dL (ref <5)

## 2015-08-26 MED ORDER — ACETAMINOPHEN 325 MG PO TABS
650.0000 mg | ORAL_TABLET | Freq: Once | ORAL | Status: AC
  Administered 2015-08-26: 650 mg via ORAL
  Filled 2015-08-26: qty 2

## 2015-08-26 MED ORDER — OXYCODONE HCL 5 MG PO TABS
10.0000 mg | ORAL_TABLET | Freq: Two times a day (BID) | ORAL | Status: DC | PRN
  Administered 2015-08-26: 10 mg via ORAL
  Filled 2015-08-26: qty 2

## 2015-08-26 NOTE — ED Provider Notes (Signed)
Arrival Date & Time: 08/26/15 & 1406 History   Chief Complaint  Patient presents with  . Depression  . Anxiety   HPI Alyssa [name] is a 74 y.o. female who presents for assessment of concerns for worsening depression symptoms the patient has been controlled on Celexa for several years for. Patient states that she is having decreased activity and inability to tolerate activities that she was once able to due to chronic pain in left knee that is residual from current episodes of gout. Patient states that she has committed herself at [facility] in remote past for similar type of symptoms patient however states that she has never attempted to commit suicide or attempted any self-harm behavior. Patient also states does not abuse medication nor has she ever taken medication to cause harm to self. Patient is medical history for hypertension hyperlipidemia chronic gout and joint pain of left knee along with peripheral vascular disease. Patient states that the symptoms have been present for one week associated with no fevers chills chest pain shortness of breath abdominal pain back pain or redness or swelling of knees. Patient states that she is feeling out of the weather and states that overall feeling of "not mattering".  Past Medical History  I reviewed & agree with nursing's documentation on PMHx, PSHx, SHx and FHx. Past Medical History  Diagnosis Date  . Hyperlipidemia   . Hypertension   . Leg pain   . Joint pain   . Headache(784.0)   . Anxiety   . Arthritis   . Peripheral vascular disease [facility])    Past Surgical History  Procedure Laterality Date  . Total hip arthroplasty      right  . 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 Joint replacement Right 2012    Hip  . Abdominal angiogram N/A 10/29/2011    Procedure: ABDOMINAL ANGIOGRAM;  Surgeon: Elam Dutch, MD;  Location: [facility] CATH LAB;  Service: Cardiovascular;  Laterality: N/A;   Social History   Social History  . Marital  Status: Single    Spouse Name: N/A  . Number of Children: N/A  . Years of Education: N/A   Social History Main Topics  . Smoking status: Current Every Day Smoker -- 0.50 packs/day    Types: Cigarettes    Last Attempt to Quit: 01/27/2014  . Smokeless tobacco: Never Used  . Alcohol Use: No  . Drug Use: No  . Sexual Activity: Not Asked   Other Topics Concern  . None   Social History Narrative   Family History  Problem Relation Age of Onset  . Heart attack Mother   . Heart disease Mother   . Hyperlipidemia Mother   . Hypertension Mother   . Heart attack Father   . Heart disease Father     Before age 62  . Hyperlipidemia Father   . Hypertension Father   . Cancer Sister   . Aneurysm Brother   . Cancer Brother     Review of Systems   Complete ROS performed by me, all positives & negatives documented as above in HPI. All other ROS negative.  Allergies  Review of patient's allergies indicates no known allergies.  Home Medications   Prior to Admission medications   Medication Sig Start Date End Date Taking? Authorizing Provider  Amlodipine-Valsartan-HCTZ 10-320-25 MG TABS Take 1 tablet by mouth daily.   Yes Historical Provider, MD  aspirin-acetaminophen-caffeine (EXCEDRIN MIGRAINE) [phone] MG per tablet Take 1 tablet by mouth every 6 (six) hours as  needed for headache.   Yes Historical Provider, MD  citalopram (CELEXA) 40 MG tablet Take 40 mg by mouth daily. 05/07/15  Yes Historical Provider, MD  ferrous sulfate 325 (65 FE) MG tablet Take 325 mg by mouth daily with breakfast.   Yes Historical Provider, MD  Multiple Vitamin (MULTIVITAMIN WITH MINERALS) TABS tablet Take 1 tablet by mouth daily. One a Day Women's   Yes Historical Provider, MD  predniSONE (DELTASONE) 20 MG tablet Take 60 mg daily x 2 days then 40 mg daily x 2 days then 20 mg daily x 2 days Patient taking differently: Take 20-60 mg by mouth daily with breakfast. Take 3 tablets (60 mg) daily x 2 days then 2  tablets (40 mg) daily x 2 days then 1 tablet (20 mg) daily x 2 days 08/15/15  Yes Wandra Arthurs, MD  rosuvastatin (CRESTOR) 10 MG tablet Take 10 mg by mouth daily.    Yes Historical Provider, MD  traZODone (DESYREL) 50 MG tablet Take 50 mg by mouth at bedtime.   Yes Historical Provider, MD  LORazepam (ATIVAN) 1 MG tablet Take 0.5 tablets (0.5 mg total) by mouth at bedtime. Take as prescribed. Do NOT take greater or more frequently then prescribed. Do NOT take with other sedating medications or ANY alcohol as this can result in death. This medication can impair coordination and reflexes, and cause drowsiness. Do NOT perform tasks in which this would place you in danger as it can make you a FALL RISK. 08/27/15   Voncille Lo, MD  OVER THE COUNTER MEDICATION Apply 1 patch topically at bedtime as needed (pain). Over the counter lidocaine patches    Historical Provider, MD  Oxycodone HCl 10 MG TABS Take 1 tablet (10 mg total) by mouth 2 (two) times daily as needed (pain). 08/15/15   Wandra Arthurs, MD    Physical Exam  BP 135/77 mmHg  Pulse 77  Temp(Src) 98.2 F (36.8 C) (Oral)  Resp 16  Ht 5\' 3"  (1.6 m)  Wt 98 lb 11.2 oz (44.77 kg)  BMI 17.49 kg/m2  SpO2 99% Physical Exam  Constitutional: She is oriented to person, place, and time. She appears cachectic.  Non-toxic appearance. She does not appear ill. No distress.  HENT:  Head: Normocephalic and atraumatic.  Right Ear: External ear normal.  Left Ear: External ear normal.  Eyes: Pupils are equal, round, and reactive to light. No scleral icterus.  Neck: Normal range of motion. Neck supple. No tracheal deviation present.  Cardiovascular: Normal heart sounds and intact distal pulses.   No murmur heard. Pulmonary/Chest: Effort normal and breath sounds normal. No stridor. No respiratory distress. She has no wheezes. She has no rales.  Abdominal: Soft. Bowel sounds are normal. She exhibits no distension. There is no tenderness. There is no rebound and no  guarding.  Musculoskeletal:  Patient has decreased ROM in left and right knees due to arthritis however they are without erythema warmth or swelling. Passive ROM without remarkable pain and no ttp.  Neurological: She is alert and oriented to person, place, and time. She has normal strength and normal reflexes. No cranial nerve deficit or sensory deficit.  Skin: Skin is warm and dry. No pallor.  Psychiatric: Her speech is normal and behavior is normal. Thought content is not delusional. Cognition and memory are normal. She exhibits a depressed mood. She expresses no homicidal and no suicidal ideation. She expresses no suicidal plans and no homicidal plans.  Nursing note and vitals reviewed.  ED Course  Procedures Labs Review Labs Reviewed  COMPREHENSIVE METABOLIC PANEL - Abnormal; Notable for the following:    Glucose, Bld 158 (*)    BUN 35 (*)    Creatinine, Ser 1.31 (*)    Total Protein 6.4 (*)    Albumin 3.3 (*)    ALT 12 (*)    Total Bilirubin <0.1 (*)    GFR calc non Af Amer 40 (*)    GFR calc Af Amer 46 (*)    All other components within normal limits  CBC WITH DIFFERENTIAL/PLATELET - Abnormal; Notable for the following:    RBC 3.50 (*)    Hemoglobin 9.4 (*)    HCT 28.8 (*)    RDW 17.5 (*)    Platelets 753 (*)    All other components within normal limits  ETHANOL    Imaging Review No results found.  Laboratory and Imaging results were personally reviewed by myself and used in the medical decision making of this patient's treatment and disposition.  EKG Interpretation  EKG Interpretation  Date/Time:    Ventricular Rate:    PR Interval:    QRS Duration:   QT Interval:    QTC Calculation:   R Axis:     Text Interpretation:        MDM  Alyssa [name] is a 74 y.o. female with H&P as above. ED clinical course as follows:  Patient has prior history of anxiety and depression and presents with symptoms concerning for depressive episode. Patient is on Celexa  and states she has been taking without fail. Patient states no endorsement of self-harm and no toxic ingestion and patient adamantly endorses no desire to harm self or harm others. therefore patient does not require involuntary commitment at this time patient is requesting to speak with psychiatry And patient does not require a sitter at this time. Consult placed in system to TeleMed psychiatric service along with sending requested laboratory work. Diet ordered patient has chronic knee pain and her home medication of oxycodone 10 mg was ordered when necessary. Home medications reconciled. Home medications are taking once daily and requires no further medications at this time.  Labs unremarkable and patient has follow up arranged with psychiatric services that is to her agreement. Again I confirmed no SI or HI prior to d/c and Telemed psych tream does not believe patient a risk to self. Disposition: Follow up arranged with Psych and PO ativan RX with side effect discussion provided for anxiety.   Clinical Impression:  1. Depression    Patient care discussed with Dr. Sabra Heck, who oversaw their evaluation & treatment & voiced agreement. House Officer: Voncille Lo, MD, Emergency Medicine.  Voncille Lo, MD 08/28/15 [id number]  Noemi Chapel, MD 08/29/15 [phone]

## 2015-08-26 NOTE — ED Provider Notes (Signed)
I saw and evaluated the patient, reviewed the resident's note and I agree with the findings and plan.  Pertinent History: The patient is a 74 year old female, prior history of depression and anxiety who presents with increased anxiety. He states that she has been crying a lot, denies any suicidal or homicidal thoughts, she does not have hallucinations, she states "I just want to talk to someone, I need new prescriptions to help with my anxiety". Pertinent Exam findings: On exam the patient is calm, collected, she does become tearful and anxious appearing at times, she denies any suicidality. No other acute findings   Final diagnoses:  Depression      Noemi Chapel, MD 08/29/15 1949

## 2015-08-26 NOTE — ED Notes (Signed)
Pt reports feeling depressed and anxious x 3 days. Reports taking depression medication with no help. States "I just cry." Pt denies being SI/HI. Denies drug/alcohol abuse. States "I just feel like I need to talk to someone to get help with my medicine." Pt pleasant, cooperative.

## 2015-08-27 ENCOUNTER — Encounter (HOSPITAL_COMMUNITY): Payer: Self-pay | Admitting: Emergency Medicine

## 2015-08-27 ENCOUNTER — Emergency Department (EMERGENCY_DEPARTMENT_HOSPITAL)
Admission: EM | Admit: 2015-08-27 | Discharge: 2015-08-30 | Disposition: A | Discharge status: Home / Self Care | Payer: Medicare Other | Source: Home / Self Care | Attending: Emergency Medicine | Admitting: Emergency Medicine

## 2015-08-27 DIAGNOSIS — F332 Major depressive disorder, recurrent severe without psychotic features: Secondary | ICD-10-CM | POA: Diagnosis present

## 2015-08-27 DIAGNOSIS — F32A Depression, unspecified: Secondary | ICD-10-CM

## 2015-08-27 DIAGNOSIS — F1110 Opioid abuse, uncomplicated: Secondary | ICD-10-CM | POA: Insufficient documentation

## 2015-08-27 DIAGNOSIS — E785 Hyperlipidemia, unspecified: Secondary | ICD-10-CM | POA: Insufficient documentation

## 2015-08-27 DIAGNOSIS — M109 Gout, unspecified: Secondary | ICD-10-CM | POA: Insufficient documentation

## 2015-08-27 DIAGNOSIS — I10 Essential (primary) hypertension: Secondary | ICD-10-CM | POA: Insufficient documentation

## 2015-08-27 DIAGNOSIS — Z79899 Other long term (current) drug therapy: Secondary | ICD-10-CM | POA: Insufficient documentation

## 2015-08-27 DIAGNOSIS — M1990 Unspecified osteoarthritis, unspecified site: Secondary | ICD-10-CM | POA: Insufficient documentation

## 2015-08-27 DIAGNOSIS — Z7952 Long term (current) use of systemic steroids: Secondary | ICD-10-CM | POA: Insufficient documentation

## 2015-08-27 DIAGNOSIS — Z720 Tobacco use: Secondary | ICD-10-CM | POA: Insufficient documentation

## 2015-08-27 DIAGNOSIS — F418 Other specified anxiety disorders: Secondary | ICD-10-CM | POA: Insufficient documentation

## 2015-08-27 DIAGNOSIS — M2550 Pain in unspecified joint: Secondary | ICD-10-CM | POA: Insufficient documentation

## 2015-08-27 DIAGNOSIS — R531 Weakness: Secondary | ICD-10-CM | POA: Diagnosis not present

## 2015-08-27 DIAGNOSIS — F329 Major depressive disorder, single episode, unspecified: Secondary | ICD-10-CM

## 2015-08-27 LAB — BASIC METABOLIC PANEL
Anion gap: 9 (ref 5–15)
Anion gap: 9 (ref 5–15)
BUN: 39 mg/dL — ABNORMAL HIGH (ref 6–20)
BUN: 37 mg/dL — ABNORMAL HIGH (ref 6–20)
CO2: 22 mmol/L (ref 22–32)
CO2: 23 mmol/L (ref 22–32)
Calcium: 9.2 mg/dL (ref 8.9–10.3)
Calcium: 9 mg/dL (ref 8.9–10.3)
Chloride: 107 mmol/L (ref 101–111)
Chloride: 104 mmol/L (ref 101–111)
Creatinine, Ser: 1.28 mg/dL — ABNORMAL HIGH (ref 0.44–1.00)
Creatinine, Ser: 1.28 mg/dL — ABNORMAL HIGH (ref 0.44–1.00)
GFR calc Af Amer: 47 mL/min — ABNORMAL LOW (ref >60)
GFR calc Af Amer: 47 mL/min — ABNORMAL LOW (ref >60)
GFR calc non Af Amer: 41 mL/min — ABNORMAL LOW (ref >60)
GFR calc non Af Amer: 41 mL/min — ABNORMAL LOW (ref >60)
Glucose, Bld: 137 mg/dL — ABNORMAL HIGH (ref 65–99)
Glucose, Bld: 164 mg/dL — ABNORMAL HIGH (ref 65–99)
Potassium: 6 mmol/L — ABNORMAL HIGH (ref 3.5–5.1)
Potassium: 5.4 mmol/L — ABNORMAL HIGH (ref 3.5–5.1)
Sodium: 138 mmol/L (ref 135–145)
Sodium: 136 mmol/L (ref 135–145)

## 2015-08-27 LAB — RAPID URINE DRUG SCREEN, HOSP PERFORMED
Amphetamines: NOT DETECTED
Barbiturates: NOT DETECTED
Benzodiazepines: NOT DETECTED
Cocaine: NOT DETECTED
Opiates: POSITIVE — AB
Tetrahydrocannabinol: NOT DETECTED

## 2015-08-27 LAB — CBC WITH DIFFERENTIAL/PLATELET
Basophils Absolute: 0 10*3/uL (ref 0.0–0.1)
Basophils Relative: 0 %
Eosinophils Absolute: 0.1 10*3/uL (ref 0.0–0.7)
Eosinophils Relative: 1 %
HCT: 28.2 % — ABNORMAL LOW (ref 36.0–46.0)
Hemoglobin: 9.3 g/dL — ABNORMAL LOW (ref 12.0–15.0)
Lymphocytes Relative: 20 %
Lymphs Abs: 1.9 10*3/uL (ref 0.7–4.0)
MCH: 27.3 pg (ref 26.0–34.0)
MCHC: 33 g/dL (ref 30.0–36.0)
MCV: 82.7 fL (ref 78.0–100.0)
Monocytes Absolute: 1 10*3/uL (ref 0.1–1.0)
Monocytes Relative: 10 %
Neutro Abs: 6.7 10*3/uL (ref 1.7–7.7)
Neutrophils Relative %: 69 %
Platelets: 743 10*3/uL — ABNORMAL HIGH (ref 150–400)
RBC: 3.41 MIL/uL — ABNORMAL LOW (ref 3.87–5.11)
RDW: 17.6 % — ABNORMAL HIGH (ref 11.5–15.5)
WBC: 9.7 10*3/uL (ref 4.0–10.5)

## 2015-08-27 LAB — ETHANOL: Alcohol, Ethyl (B): <5 mg/dL (ref <5)

## 2015-08-27 MED ORDER — IBUPROFEN 800 MG PO TABS: 800.0000 mg | ORAL_TABLET | Freq: Once | ORAL | Status: DC

## 2015-08-27 MED ORDER — OXYCODONE HCL ER 10 MG PO T12A: 10.0000 mg | EXTENDED_RELEASE_TABLET | Freq: Once | ORAL | Status: DC

## 2015-08-27 MED ORDER — OXYCODONE-ACETAMINOPHEN 5-325 MG PO TABS
1.0000 | ORAL_TABLET | Freq: Once | ORAL | Status: AC
  Administered 2015-08-27: 1 via ORAL
  Filled 2015-08-27: qty 1

## 2015-08-27 MED ORDER — SODIUM CHLORIDE 0.9 % IV BOLUS (SEPSIS)
1000.0000 mL | Freq: Once | INTRAVENOUS | Status: AC
  Administered 2015-08-27: 1000 mL via INTRAVENOUS

## 2015-08-27 MED ORDER — OXYCODONE HCL ER 10 MG PO T12A
10.0000 mg | EXTENDED_RELEASE_TABLET | Freq: Once | ORAL | Status: AC
  Administered 2015-08-27: 10 mg via ORAL
  Filled 2015-08-27: qty 1

## 2015-08-27 MED ORDER — ZOLPIDEM TARTRATE 5 MG PO TABS
5.0000 mg | ORAL_TABLET | Freq: Once | ORAL | Status: AC
  Administered 2015-08-27: 5 mg via ORAL
  Filled 2015-08-27: qty 1

## 2015-08-27 MED ORDER — LORAZEPAM 1 MG PO TABS: 0.5000 mg | ORAL_TABLET | Freq: Every day | ORAL | Status: DC

## 2015-08-27 MED ORDER — ACETAMINOPHEN 325 MG PO TABS
650.0000 mg | ORAL_TABLET | Freq: Once | ORAL | Status: AC
  Administered 2015-08-27: 650 mg via ORAL
  Filled 2015-08-27: qty 2

## 2015-08-27 NOTE — [facility] Counselor (Addendum)
Per [name], PA, Pt does not meet inpt criteria. Pt to follow-up with outpatient treatment. Outpt resources were faxed to [facility] and provided to pt prior to discharge.  Counselor informed Alyssa Alamin, RN and EDP (Dr Freda Munro) of disposition and they are in agreement.   [name], [facility] Therapeutic Triage

## 2015-08-27 NOTE — ED Provider Notes (Signed)
CSN: [id number]     Arrival date & time 08/27/15  1353 History   First MD Initiated Contact with Patient 08/27/15 1504     Chief Complaint  Patient presents with  . Anxiety  . Depression     (Consider location/radiation/quality/duration/timing/severity/associated sxs/prior Treatment) HPI Comments: 74 y.o. Female with history of depression, anxiety, PVD, gout presents for depression and anxiety.  The patient states that these symptoms have been uncontrolled at home.  She was seen yesterday for the same symptoms.  She says that she is unable to sleep and that feels extremely hopeless and overwhelmed with life.  She states that she had an episode like this before 3-4 years ago for which she was admitted and had inpatient psychiatric treatment with great improvement in her symptoms.  She says that she feels she needs inpatient help but denies homicidal or suicidal ideation.  Patient is a 74 y.o. female presenting with anxiety and depression.  Anxiety Pertinent negatives include no chest pain, no abdominal pain and no shortness of breath.  Depression Pertinent negatives include no chest pain, no abdominal pain and no shortness of breath.    Past Medical History  Diagnosis Date  . Hyperlipidemia   . Hypertension   . Leg pain   . Joint pain   . Headache(784.0)   . Anxiety   . Arthritis   . Peripheral vascular disease [facility])    Past Surgical History  Procedure Laterality Date  . Total hip arthroplasty      right  . 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 Joint replacement Right 2012    Hip  . Abdominal angiogram N/A 10/29/2011    Procedure: ABDOMINAL ANGIOGRAM;  Surgeon: Elam Dutch, MD;  Location: [facility] CATH LAB;  Service: Cardiovascular;  Laterality: N/A;   Family History  Problem Relation Age of Onset  . Heart attack Mother   . Heart disease Mother   . Hyperlipidemia Mother   . Hypertension Mother   . Heart attack Father   . Heart disease Father     Before age 27  .  Hyperlipidemia Father   . Hypertension Father   . Cancer Sister   . Aneurysm Brother   . Cancer Brother    Social History  Substance Use Topics  . Smoking status: Current Every Day Smoker -- 0.50 packs/day    Types: Cigarettes    Last Attempt to Quit: 01/27/2014  . Smokeless tobacco: Never Used  . Alcohol Use: No   OB History    No data available     Review of Systems  Constitutional: Negative for chills, appetite change and fatigue.  HENT: Negative for congestion, postnasal drip and rhinorrhea.   Eyes: Negative for pain and redness.  Respiratory: Negative for chest tightness and shortness of breath.   Cardiovascular: Negative for chest pain and palpitations.  Gastrointestinal: Negative for nausea, vomiting, abdominal pain and diarrhea.  Genitourinary: Negative for dysuria, urgency and hematuria.  Musculoskeletal: Positive for arthralgias (history of gout). Negative for myalgias and back pain.  Skin: Negative for rash.  Neurological: Negative for dizziness, weakness, light-headedness and numbness.  Hematological: Does not bruise/bleed easily.  Psychiatric/Behavioral: Positive for depression and dysphoric mood. Negative for suicidal ideas and self-injury. The patient is not nervous/anxious.       Allergies  Review of patient's allergies indicates no known allergies.  Home Medications   Prior to Admission medications   Medication Sig Start Date End Date Taking? Authorizing Provider  Amlodipine-Valsartan-HCTZ [phone] MG TABS Take  1 tablet by mouth daily.   Yes Historical Provider, MD  aspirin-acetaminophen-caffeine (EXCEDRIN MIGRAINE) [phone] MG per tablet Take 1 tablet by mouth every 6 (six) hours as needed for headache.   Yes Historical Provider, MD  citalopram (CELEXA) 40 MG tablet Take 40 mg by mouth daily. 05/07/15  Yes Historical Provider, MD  ferrous sulfate 325 (65 FE) MG tablet Take 325 mg by mouth daily with breakfast.   Yes Historical Provider, MD  LORazepam  (ATIVAN) 1 MG tablet Take 0.5 tablets (0.5 mg total) by mouth at bedtime. Take as prescribed. Do NOT take greater or more frequently then prescribed. Do NOT take with other sedating medications or ANY alcohol as this can result in death. This medication can impair coordination and reflexes, and cause drowsiness. Do NOT perform tasks in which this would place you in danger as it can make you a FALL RISK. 08/27/15  Yes Voncille Lo, MD  Multiple Vitamin (MULTIVITAMIN WITH MINERALS) TABS tablet Take 1 tablet by mouth daily. One a Day Women's   Yes Historical Provider, MD  OVER THE COUNTER MEDICATION Apply 1 patch topically at bedtime as needed (pain). Over the counter lidocaine patches   Yes Historical Provider, MD  Oxycodone HCl 10 MG TABS Take 1 tablet (10 mg total) by mouth 2 (two) times daily as needed (pain). 08/15/15  Yes Wandra Arthurs, MD  rosuvastatin (CRESTOR) 10 MG tablet Take 10 mg by mouth daily.    Yes Historical Provider, MD  traZODone (DESYREL) 50 MG tablet Take 50 mg by mouth at bedtime.   Yes Historical Provider, MD  predniSONE (DELTASONE) 20 MG tablet Take 60 mg daily x 2 days then 40 mg daily x 2 days then 20 mg daily x 2 days Patient taking differently: Take 20-60 mg by mouth daily with breakfast. Take 3 tablets (60 mg) daily x 2 days then 2 tablets (40 mg) daily x 2 days then 1 tablet (20 mg) daily x 2 days 08/15/15   Wandra Arthurs, MD   BP 141/74 mmHg  Pulse 84  Temp(Src) 97.9 F (36.6 C) (Temporal)  Resp 16  SpO2 98% Physical Exam  Constitutional: She is oriented to person, place, and time. She appears well-developed and well-nourished. No distress.  HENT:  Head: Normocephalic and atraumatic.  Right Ear: External ear normal.  Left Ear: External ear normal.  Nose: Nose normal.  Mouth/Throat: Oropharynx is clear and moist. No oropharyngeal exudate.  Eyes: EOM are normal. Pupils are equal, round, and reactive to light.  Neck: Normal range of motion. Neck supple.  Cardiovascular:  Normal rate, regular rhythm, normal heart sounds and intact distal pulses.   No murmur heard. Pulmonary/Chest: Effort normal. No respiratory distress. She has no wheezes. She has no rales.  Abdominal: Soft. She exhibits no distension. There is no tenderness.  Musculoskeletal: Normal range of motion. She exhibits no edema or tenderness.  Neurological: She is alert and oriented to person, place, and time.  Skin: Skin is warm and dry. No rash noted. She is not diaphoretic.  Psychiatric: Her mood appears anxious. Her affect is not blunt. Her speech is tangential. Her speech is not rapid and/or pressured and not delayed. She is slowed. She is not actively hallucinating. She is attentive.  Vitals reviewed.   ED Course  Procedures (including critical care time) Labs Review Labs Reviewed  CBC WITH DIFFERENTIAL/PLATELET - Abnormal; Notable for the following:    RBC 3.41 (*)    Hemoglobin 9.3 (*)  HCT 28.2 (*)    RDW 17.6 (*)    Platelets 743 (*)    All other components within normal limits  BASIC METABOLIC PANEL - Abnormal; Notable for the following:    Potassium 6.0 (*)    Glucose, Bld 137 (*)    BUN 39 (*)    Creatinine, Ser 1.28 (*)    GFR calc non Af Amer 41 (*)    GFR calc Af Amer 47 (*)    All other components within normal limits  URINE RAPID DRUG SCREEN, HOSP PERFORMED - Abnormal; Notable for the following:    Opiates POSITIVE (*)    All other components within normal limits  BASIC METABOLIC PANEL - Abnormal; Notable for the following:    Potassium 5.4 (*)    Glucose, Bld 164 (*)    BUN 37 (*)    Creatinine, Ser 1.28 (*)    GFR calc non Af Amer 41 (*)    GFR calc Af Amer 47 (*)    All other components within normal limits  ETHANOL    Imaging Review No results found. I have personally reviewed and evaluated these images and lab results as part of my medical decision-making.   EKG Interpretation   Date/Time:  Wednesday August 27 2015 19:01:55 EST Ventricular Rate:   78 PR Interval:  176 QRS Duration: 81 QT Interval:  376 QTC Calculation: 428 R Axis:   69 Text Interpretation:  Sinus rhythm No significant change since last  tracing Confirmed by NGUYEN, EMILY ([id number]) on 08/27/2015 7:36:04 PM      MDM  Patient seen and evaluated in stable condition.  Discussed with psych PA who recommended reevaluation for possible intensive outpatient follow up or further treatment help.  TTS consulted in light of this recommendation.  Initial labs showed elevated potassium of 6 although yesterday K normal.  Repeat 5.4.  EKG unremarkable.  Patient given IV fluids and medically cleared.  Patient seen by TTS who recommended observation with evaluation by psychiatrist in the morning.  Patient transferred to psych ED.   Final diagnoses:  None    1. Depression  2. Anxiety    Harvel Quale, MD 08/28/15 [phone]

## 2015-08-27 NOTE — ED Notes (Addendum)
Hx of anxiety. States she began feeling a panic attack coming on this morning, "I'm getting very anxious and depressed again and I just don't want to lose it." Denies SI/HI. Pt presents tachycardic at 110. Seen yesterday at [facility] and discharged for same thing.

## 2015-08-27 NOTE — ED Notes (Signed)
Pt left with all her belongings and ambulated out of the treatment area.  

## 2015-08-27 NOTE — BH Assessment (Addendum)
Assessment Note  Alyssa [name] is an 74 y.o. female who presents to [facility] voluntarily seeking outpatient resources for medication management for depression. Patient states that she was recently diagnosed with gout and has been increasingly depressed over the past three weeks since her diagnosis. Patient states that she went to [facility] yesterday for an assessment for the same reason and was provided resources. Patient states that she contacted the resources and was told that there are no available appointments or that her insurance is not accepted. Patient states that she returned today for additional resources. Patient endorses symptoms of depression as;  Insomnia;Isolating; Fatigue; Feeling worthless/self pity; and Feeling angry/irritable. Patient states that she is not currently suicidal and denies previous attempts or self injurious behaviors. Patient denies HI and history of being violent towards others. Patient denies upcoming court dates and pending charges and access to firearms or weapons. When asked how we can help her patient states "just guide me and talk to me and if i can get some anxiety medication or something, that's what helped before." When asked how anxiety medication would help with her depression she states that she is not sure but it helped previously when she was admitted to [facility] three years ago. Patient denies other inpatient treatments and denies outpatient treatments. Patient states that she stays with her friend and brother and her son is very supportive. Patient contracts for safety and states that she would be comfortable following up with IOP if she was able to get in within the next 2 weeks. Patient denies SI/HI AVH. Patient denies use of drugs and alcohol. Patient UDS + opiates and BAL <5 upon assessment.   Consulted with Charmaine Downs, NP who recommends patient be observed overnight and evaluated by psychiatry in the morning.  Diagnosis: 309.0 Adjustment disorder, With  depressed mood   Past Medical History:  Past Medical History  Diagnosis Date  . Hyperlipidemia   . Hypertension   . Leg pain   . Joint pain   . Headache(784.0)   . Anxiety   . Arthritis   . Peripheral vascular disease [facility])     Past Surgical History  Procedure Laterality Date  . Total hip arthroplasty      right  . 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 Joint replacement Right 2012    Hip  . Abdominal angiogram N/A 10/29/2011    Procedure: ABDOMINAL ANGIOGRAM;  Surgeon: Elam Dutch, MD;  Location: [facility] CATH LAB;  Service: Cardiovascular;  Laterality: N/A;    Family History:  Family History  Problem Relation Age of Onset  . Heart attack Mother   . Heart disease Mother   . Hyperlipidemia Mother   . Hypertension Mother   . Heart attack Father   . Heart disease Father     Before age 83  . Hyperlipidemia Father   . Hypertension Father   . Cancer Sister   . Aneurysm Brother   . Cancer Brother     Social History:  reports that she has been smoking Cigarettes.  She has been smoking about 0.50 packs per day. She has never used smokeless tobacco. She reports that she does not drink alcohol or use illicit drugs.  Additional Social History:  Alcohol / Drug Use Pain Medications: See PTA Prescriptions: See PTA Over the Counter: See PTA History of alcohol / drug use?: No history of alcohol / drug abuse  CIWA: CIWA-Ar BP: 127/63 mmHg Pulse Rate: 81 COWS:    Allergies: No Known Allergies  Home Medications:  (Not in a hospital admission)  OB/GYN Status:  No LMP recorded. Patient is postmenopausal.  General Assessment Data [facility] of [facility]: [facility] TTS Assessment: In system Is this a Tele or Face-to-Face Assessment?: Face-to-Face Is this an Initial Assessment or a Re-assessment for this encounter?: Initial Assessment Marital status: Single Is patient pregnant?: No Pregnancy Status: No Living Arrangements: Non-relatives/Friends (friend and brother) Can  pt return to current living arrangement?: Yes Admission Status: Voluntary Is patient capable of signing voluntary admission?: Yes Referral Source: Self/Family/Friend Insurance type: [place] Living Arrangements: Non-relatives/Friends (friend and brother)  Education Status Is patient currently in school?: No  Risk to self with the past 6 months Suicidal Ideation: No Has patient been a risk to self within the past 6 months prior to admission? : No Suicidal Intent: No Has patient had any suicidal intent within the past 6 months prior to admission? : No Is patient at risk for suicide?: No Suicidal Plan?: No Has patient had any suicidal plan within the past 6 months prior to admission? : No Access to Means: No What has been your use of drugs/alcohol within the last 12 months?: None Previous Attempts/Gestures: No How many times?: 0 Other Self Harm Risks: Denies Triggers for Past Attempts: Unknown Intentional Self Injurious Behavior: None Family Suicide History: No Recent stressful life event(s): Recent negative physical changes Persecutory voices/beliefs?: No Depression: Yes Depression Symptoms: Insomnia, Isolating, Fatigue, Feeling worthless/self pity, Feeling angry/irritable Substance abuse history and/or treatment for substance abuse?: No Suicide prevention information given to non-admitted patients: Not applicable  Risk to Others within the past 6 months Homicidal Ideation: No Does patient have any lifetime risk of violence toward others beyond the six months prior to admission? : No Thoughts of Harm to Others: No Current Homicidal Intent: No Current Homicidal Plan: No Access to Homicidal Means: No Identified Victim: N/A History of harm to others?: No Assessment of Violence: None Noted Violent Behavior Description: Denies Does patient have access to weapons?: No Criminal Charges Pending?: No Does patient have a court date: No Is patient on probation?:  No  Psychosis Hallucinations: None noted Delusions: None noted  Mental Status Report Appearance/Hygiene: Unremarkable Eye Contact: Good Motor Activity: Freedom of movement Speech: Logical/coherent Level of Consciousness: Alert Mood: Sad Affect: Sad Anxiety Level: None Thought Processes: Coherent, Relevant Judgement: Unimpaired Orientation: Person, Place, Time, Situation, Appropriate for developmental age Obsessive Compulsive Thoughts/Behaviors: None  Cognitive Functioning Concentration: Decreased Memory: Recent Intact, Remote Intact IQ: Average Insight: Fair Impulse Control: Good Appetite: Fair Sleep: Decreased Total Hours of Sleep: 6 Vegetative Symptoms: None  ADLScreening [facility] Assessment Services) Patient's cognitive ability adequate to safely complete daily activities?: Yes Patient able to express need for assistance with ADLs?: Yes Independently performs ADLs?: Yes (appropriate for developmental age)  Prior Inpatient Therapy Prior Inpatient Therapy: Yes Prior Therapy Dates: 2013 Prior Therapy Facilty/Provider(s): [facility] Reason for Treatment: Depression  Prior Outpatient Therapy Prior Outpatient Therapy: No Prior Therapy Dates: N/A Prior Therapy Facilty/Provider(s): N/A Reason for Treatment: N/A Does patient have an ACCT team?: No Does patient have Intensive In-House Services?  : No Does patient have [facility] services? : No Does patient have P4CC services?: No  ADL Screening (condition at time of admission) Patient's cognitive ability adequate to safely complete daily activities?: Yes Is the patient deaf or have difficulty hearing?: No Does the patient have difficulty seeing, even when wearing glasses/contacts?: No Does the patient have difficulty concentrating, remembering, or making decisions?: No  Patient able to express need for assistance with ADLs?: Yes Does the patient have difficulty dressing or bathing?: No Independently performs ADLs?: Yes  (appropriate for developmental age) Does the patient have difficulty walking or climbing stairs?: Yes Weakness of Legs: Both Weakness of Arms/Hands: None  Home Assistive Devices/Equipment Home Assistive Devices/Equipment: Shower chair without back, Cane (specify quad or straight)  Therapy Consults (therapy consults require a physician order) PT Evaluation Needed: No OT Evalulation Needed: No SLP Evaluation Needed: No Abuse/Neglect Assessment (Assessment to be complete while patient is alone) Physical Abuse: Denies Verbal Abuse: Denies Sexual Abuse: Denies Exploitation of patient/patient's resources: Denies Self-Neglect: Denies Values / Beliefs Cultural Requests During Hospitalization: None Spiritual Requests During Hospitalization: None Consults Spiritual Care Consult Needed: No Social Work Consult Needed: No Regulatory affairs officer (For Healthcare) Does patient have an advance directive?: No Would patient like information on creating an advanced directive?: No - patient declined information    Additional Information 1:1 In Past 12 Months?: No CIRT Risk: No Elopement Risk: No Does patient have medical clearance?: Yes     Disposition:  Disposition Initial Assessment Completed for this Encounter: Yes Disposition of Patient: Other dispositions (observe overnight)  On Site Evaluation by:   Reviewed with Physician:    Konica Stankowski 08/27/2015 8:35 PM

## 2015-08-27 NOTE — BH Assessment (Signed)
Assessment completed. Consulted with Charmaine Downs, NP who recommends patient be observed overnight and evaluated in the morning.   Rosalin Hawking, LCSW Therapeutic Triage Specialist [place] 08/27/2015 5:56 PM

## 2015-08-27 NOTE — ED Notes (Addendum)
Pt stated she feels depressed due to her new Dx of gout. Pt stated she iis having left leg pain a 8/10. MD aware and pt will receive pain medication,. She is pleasant  and cooperative and contracts for safety. Pt stated she is not suicidal but is tired of being in pain and is depressed from this. Pt does ambulate with crutches which remain at her bedside. Call bell within reach and both siderails are up. 11pm-Report to [place]. Pt stated her pain has gone down to a 6.

## 2015-08-27 NOTE — BH Assessment (Addendum)
Tele Assessment Note   Alyssa [name] is an 74 y.o. female presenting with worsening depression and anxiety, especially over the past 3 days. She reports taking Celexa for several years now but she thinks that she needs her medications adjusted, in addition to adding something for anxiety. Pt reports her major stressor as her chronic pain and worsening medical problems. Pt reports depressive sx including crying spells, fatigue, insomnia, decreased appetite, lack of motivation, decreased interest in activities she previously enjoyed, social isolation, and increased irritability. She admits to passive SI such as thoughts that she "doesn't matter" and "would be better off if she weren't around". However, pt reports no active SI/HI and no plan or intent to harm herself or others. Pt reports a long hx of anxiety with very rare panic attacks, stating that tonight was the first one she had in the past year. She also denies any hx of self-harming behaviors, A/VH, or substance abuse. No known family hx of mental health problems or SA. Pt presents with depressed mood, anxious affect, and good eye-contact. Thought process is linear and relevant with no indication of delusional content. Speech is of normal rate and tone. Pt does not appear to be responding to any internal stimuli. Pt is tearful throughout the assessment, partially due to her reported pain from a migraine. Pt is oriented x4 and cooperative with assessment.  Pt is not currently under the care of a mental health professional but says that she has received outpt tx in the past. She says that she is open to seeing a counselor and psychiatrist again, as she wants to get her medications regulated and find a combination of psych meds that works for her. Pt reports 2 prior psychiatric admissions in 2010 at [facility] and again in 2013 at another facility. She states that she does not feel that she needs inpt care at this time and she is willing to follow up with  outpt providers. She reports that she can complete all ADL's except possibly needing some assistance ambulating up stairs. Pt does not have a legal guardian but has a lot of familial support. Pt says she can contract for safety.  Diaposition: Per [name], PA, Pt does not meet inpt criteria. Pt to follow-up with outpatient treatment. Outpt resources were provided to pt in ED prior to discharge.  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 Hyperlipidemia   . Hypertension   . Leg pain   . Joint pain   . Headache(784.0)   . Anxiety   . Arthritis   . Peripheral vascular disease [facility])     Past Surgical History  Procedure Laterality Date  . Total hip arthroplasty      right  . 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 Joint replacement Right 2012    Hip  . Abdominal angiogram N/A 10/29/2011    Procedure: ABDOMINAL ANGIOGRAM;  Surgeon: Elam Dutch, MD;  Location: [facility] CATH LAB;  Service: Cardiovascular;  Laterality: N/A;    Family History:  Family History  Problem Relation Age of Onset  . Heart attack Mother   . Heart disease Mother   . Hyperlipidemia Mother   . Hypertension Mother   . Heart attack Father   . Heart disease Father     Before age 61  . Hyperlipidemia Father   . Hypertension Father   . Cancer Sister   . Aneurysm Brother   .  Cancer Brother     Social History:  reports that she has been smoking Cigarettes.  She has been smoking about 0.50 packs per day. She has never used smokeless tobacco. She reports that she does not drink alcohol or use illicit drugs.  Additional Social History:  Alcohol / Drug Use Pain Medications: See PTA Med List Prescriptions: See PTA Med List Over the Counter: See PTA Med List History of alcohol / drug use?: No history of alcohol / drug abuse  CIWA: CIWA-Ar BP: 130/79 mmHg Pulse Rate: 79 COWS:    PATIENT STRENGTHS:  (choose at least two) Ability for insight Average or above average intelligence Communication skills Religious Affiliation Supportive family/friends  Allergies: No Known Allergies  Home Medications:  (Not in a hospital admission)  OB/GYN Status:  No LMP recorded. Patient is postmenopausal.  General Assessment Data Location of Assessment: [facility] ED TTS Assessment: In system Is this a Tele or Face-to-Face Assessment?: Tele Assessment Is this an Initial Assessment or a Re-assessment for this encounter?: Initial Assessment Marital status: Long term relationship Is patient pregnant?: No Pregnancy Status: No Living Arrangements: Spouse/significant other, Other relatives Can pt return to current living arrangement?: Yes Admission Status: Voluntary Is patient capable of signing voluntary admission?: Yes Referral Source: Self/Family/Friend Insurance type: [facility]     Crisis Care Plan Living Arrangements: Spouse/significant other, Other relatives Name of Psychiatrist: None Name of Therapist: None  Education Status Is patient currently in school?: No Current Grade: na Highest grade of school patient has completed: na Name of school: na Contact person: na  Risk to self with the past 6 months Suicidal Ideation: No-Not Currently/Within Last 6 Months (Passive ideation, no plan/intent) Has patient been a risk to self within the past 6 months prior to admission? : No Suicidal Intent: No Has patient had any suicidal intent within the past 6 months prior to admission? : No Is patient at risk for suicide?: No Suicidal Plan?: No Has patient had any suicidal plan within the past 6 months prior to admission? : No Access to Means: No What has been your use of drugs/alcohol within the last 12 months?: None Previous Attempts/Gestures: No How many times?: 0 Other Self Harm Risks: None Triggers for Past Attempts: Unknown Intentional Self Injurious Behavior: None Family Suicide History:  No Recent stressful life event(s): Recent negative physical changes (Chronic pain and other medical complaints) Persecutory voices/beliefs?: No Depression: Yes Depression Symptoms: Insomnia, Tearfulness, Isolating, Fatigue, Feeling worthless/self pity, Feeling angry/irritable Substance abuse history and/or treatment for substance abuse?: No Suicide prevention information given to non-admitted patients: Not applicable  Risk to Others within the past 6 months Homicidal Ideation: No Does patient have any lifetime risk of violence toward others beyond the six months prior to admission? : No Thoughts of Harm to Others: No Current Homicidal Intent: No Current Homicidal Plan: No Access to Homicidal Means: No Identified Victim: n/a History of harm to others?: No Assessment of Violence: None Noted Violent Behavior Description: No known hx of violence; Pt calm and cooperative Does patient have access to weapons?: No Criminal Charges Pending?: No Does patient have a court date: No Is patient on probation?: No  Psychosis Hallucinations: None noted Delusions: None noted  Mental Status Report Appearance/Hygiene: Unremarkable Eye Contact: Good Motor Activity: Freedom of movement Speech: Logical/coherent Level of Consciousness: Crying Mood: Depressed Affect: Anxious Anxiety Level: Severe Thought Processes: Coherent, Relevant Judgement: Unimpaired Orientation: Person, Place, Time, Situation Obsessive Compulsive Thoughts/Behaviors: None  Cognitive Functioning Concentration: Good Memory: Recent Intact IQ:  Average Insight: Fair Impulse Control: Good Appetite: Fair Weight Loss: 0 Weight Gain: 0 Sleep: Decreased Total Hours of Sleep: 5 Vegetative Symptoms: Staying in bed  ADLScreening [facility] Assessment Services) Patient's cognitive ability adequate to safely complete daily activities?: Yes Patient able to express need for assistance with ADLs?: Yes Independently performs ADLs?: Yes  (appropriate for developmental age)  Prior Inpatient Therapy Prior Inpatient Therapy: Yes Prior Therapy Dates: 2010, 2013 Prior Therapy Facilty/Provider(s): [facility] and another facility Reason for Treatment: Depression  Prior Outpatient Therapy Prior Outpatient Therapy: Yes Prior Therapy Dates: "A long time ago" Prior Therapy Facilty/Provider(s): Pt cannot recall Reason for Treatment: Depression, Anxiety Does patient have an ACCT team?: No Does patient have Intensive In-House Services?  : No Does patient have [facility] services? : No Does patient have P4CC services?: No  ADL Screening (condition at time of admission) Patient's cognitive ability adequate to safely complete daily activities?: Yes Is the patient deaf or have difficulty hearing?: No Does the patient have difficulty seeing, even when wearing glasses/contacts?: No Does the patient have difficulty concentrating, remembering, or making decisions?: No Patient able to express need for assistance with ADLs?: Yes Does the patient have difficulty dressing or bathing?: No Independently performs ADLs?: Yes (appropriate for developmental age) Does the patient have difficulty walking or climbing stairs?: Yes Weakness of Legs: None Weakness of Arms/Hands: None  Home Assistive Devices/Equipment Home Assistive Devices/Equipment: None    Abuse/Neglect Assessment (Assessment to be complete while patient is alone) Physical Abuse: Denies Verbal Abuse: Denies Sexual Abuse: Denies Exploitation of patient/patient's resources: Denies Self-Neglect: Denies Values / Beliefs Cultural Requests During Hospitalization: None Spiritual Requests During Hospitalization: None   Advance Directives (For Healthcare) Does patient have an advance directive?: No Would patient like information on creating an advanced directive?: No - patient declined information    Additional Information 1:1 In Past 12 Months?: No CIRT Risk: No Elopement Risk:  No Does patient have medical clearance?: Yes     Disposition: Per [name], PA, Pt does not meet inpt criteria. Pt to follow-up with outpatient treatment. Outpt resources were provided to pt in ED prior to discharge. Disposition Initial Assessment Completed for this Encounter: Yes Disposition of Patient: Outpatient treatment Type of outpatient treatment: Adult  [name], [facility]  08/27/2015 12:01 AM

## 2015-08-28 DIAGNOSIS — F332 Major depressive disorder, recurrent severe without psychotic features: Secondary | ICD-10-CM | POA: Diagnosis not present

## 2015-08-28 DIAGNOSIS — R531 Weakness: Secondary | ICD-10-CM | POA: Diagnosis not present

## 2015-08-28 MED ORDER — IRBESARTAN 300 MG PO TABS
300.0000 mg | ORAL_TABLET | Freq: Every day | ORAL | Status: DC
  Administered 2015-08-28 – 2015-08-30 (×3): 300 mg via ORAL
  Filled 2015-08-28 (×4): qty 1

## 2015-08-28 MED ORDER — OXYCODONE HCL 5 MG PO TABS
5.0000 mg | ORAL_TABLET | Freq: Two times a day (BID) | ORAL | Status: DC | PRN
  Administered 2015-08-28 – 2015-08-30 (×5): 5 mg via ORAL
  Filled 2015-08-28 (×5): qty 1

## 2015-08-28 MED ORDER — AMLODIPINE BESYLATE 10 MG PO TABS
10.0000 mg | ORAL_TABLET | Freq: Every day | ORAL | Status: DC
  Administered 2015-08-28 – 2015-08-30 (×3): 10 mg via ORAL
  Filled 2015-08-28 (×4): qty 1

## 2015-08-28 MED ORDER — OXYCODONE HCL 5 MG PO TABS
10.0000 mg | ORAL_TABLET | Freq: Two times a day (BID) | ORAL | Status: DC | PRN
  Administered 2015-08-28 – 2015-08-30 (×5): 10 mg via ORAL
  Filled 2015-08-28 (×5): qty 2

## 2015-08-28 MED ORDER — ROSUVASTATIN CALCIUM 10 MG PO TABS
10.0000 mg | ORAL_TABLET | Freq: Every day | ORAL | Status: DC
  Administered 2015-08-28 – 2015-08-29 (×2): 10 mg via ORAL
  Filled 2015-08-28 (×4): qty 1

## 2015-08-28 MED ORDER — LORAZEPAM 0.5 MG PO TABS
0.5000 mg | ORAL_TABLET | Freq: Every day | ORAL | Status: DC
  Administered 2015-08-28 (×2): 0.5 mg via ORAL
  Filled 2015-08-28 (×2): qty 1

## 2015-08-28 MED ORDER — CITALOPRAM HYDROBROMIDE 40 MG PO TABS
40.0000 mg | ORAL_TABLET | Freq: Every day | ORAL | Status: DC
  Administered 2015-08-28 – 2015-08-30 (×3): 40 mg via ORAL
  Filled 2015-08-28 (×4): qty 1

## 2015-08-28 MED ORDER — AMLODIPINE-VALSARTAN-HCTZ 10-320-25 MG PO TABS: 1.0000 | ORAL_TABLET | Freq: Every day | ORAL | Status: DC

## 2015-08-28 MED ORDER — HYDROCHLOROTHIAZIDE 25 MG PO TABS
25.0000 mg | ORAL_TABLET | Freq: Every day | ORAL | Status: DC
  Administered 2015-08-28 – 2015-08-30 (×3): 25 mg via ORAL
  Filled 2015-08-28 (×4): qty 1

## 2015-08-28 NOTE — Progress Notes (Signed)
Pharmacy Note:  citalopram dosage  Order noted for citalopram 40 mg daily.  FDA recommendations currently recommend limiting citalopram dosage to 20 mg daily when age > 63, due to increased risk of QTc prolongation and life-threatening arrhythmias.  However, QTc on admission was 428 msec (wnl), patient was reportedly taking citalpram 40 mg daily PTA, and she presented to ED with symptoms concerning for recurrent depressive episode.  Therefore, dosage reduction may not be clinically indicated.  Recommend: Consider benefit vs.risk of continuing current citalopram dosage.  Clayburn Pert, PharmD, BCPS Pager: [phone] 08/28/2015  1:42 PM

## 2015-08-28 NOTE — ED Notes (Signed)
Pt asked for a coffee with three creams and four sugars.  Pt's request granted.

## 2015-08-28 NOTE — BH Assessment (Signed)
[facility] Assessment Progress Note  The following facilities have been contacted to seek placement for this pt, with results as noted:  Beds available, information sent, decision pending:  [facility] [address] [place] Triage Specialist [phone]

## 2015-08-28 NOTE — Consult Note (Signed)
[place] [facility] Consult   Reason for Consult:  Recurrent Depression Referring Physician:  EDP Patient Identification: Alyssa [name] MRN:  [id number] Principal Diagnosis: Severe recurrent major depression without psychotic features [facility]) Diagnosis:   Patient Active Problem List   Diagnosis Date Noted  . Severe recurrent major depression without psychotic features ([place]) [F33.2] 08/28/2015    Priority: High  . Spondylolisthesis at L4-L5 level [M43.16] 07/30/2015  . PVD (peripheral vascular disease) with claudication ([place]) [I73.9] 07/29/2014  . Weakness-Bilateral arm/leg [R53.1] 07/29/2014  . Numbness-left leg [R20.0] 07/29/2014  . Swelling of limb-Legs [M79.89] 07/29/2014  . Atherosclerosis of native arteries of the extremities with intermittent claudication [I70.219] 09/30/2011    Total Time spent with patient: 45 minutes  Subjective:   Alyssa [name] is a 74 y.o. female patient admitted with Recurrent depression.  HPI: AA female, 74 years old was evaluated for recurrent depression.  Patient was seen and discharged from [facility] two days ago.  She was given outpatient referral for a Psychiatrist.  Patient states that she called every provider on her list but none of them could see her before January.   Patient came back yesterday because she need her antidepressant restarted and increased.  Patient denies SI/HI/AVH. She reports poor sleep and appetite.  Patient had back surgery recently and ambulates with her crutches.  Patient also has a pending knee surgery coming up.  Patient has been accepted for admission and we will be seeking placement at any facility with available beds.  Past Psychiatric History:   Depression  Risk to Self: Suicidal Ideation: No Suicidal Intent: No Is patient at risk for suicide?: No Suicidal Plan?: No Access to Means: No What has been your use of drugs/alcohol within the last 12 months?: None How many times?: 0 Other Self Harm  Risks: Denies Triggers for Past Attempts: Unknown Intentional Self Injurious Behavior: None Risk to Others: Homicidal Ideation: No Thoughts of Harm to Others: No Current Homicidal Intent: No Current Homicidal Plan: No Access to Homicidal Means: No Identified Victim: N/A History of harm to others?: No Assessment of Violence: None Noted Violent Behavior Description: Denies Does patient have access to weapons?: No Criminal Charges Pending?: No Does patient have a court date: No Prior Inpatient Therapy: Prior Inpatient Therapy: Yes Prior Therapy Dates: 2013 Prior Therapy Facilty/Provider(s): [facility] Reason for Treatment: Depression Prior Outpatient Therapy: Prior Outpatient Therapy: No Prior Therapy Dates: N/A Prior Therapy Facilty/Provider(s): N/A Reason for Treatment: N/A Does patient have an ACCT team?: No Does patient have Intensive In-House Services?  : No Does patient have [facility] services? : No Does patient have P4CC services?: No  Past Medical History:  Past Medical History  Diagnosis Date  . Hyperlipidemia   . Hypertension   . Leg pain   . Joint pain   . Headache(784.0)   . Anxiety   . Arthritis   . Peripheral vascular disease [facility])     Past Surgical History  Procedure Laterality Date  . Total hip arthroplasty      right  . 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 Joint replacement Right 2012    Hip  . Abdominal angiogram N/A 10/29/2011    Procedure: ABDOMINAL ANGIOGRAM;  Surgeon: Elam Dutch, MD;  Location: [facility] CATH LAB;  Service: Cardiovascular;  Laterality: N/A;   Family History:  Family History  Problem Relation Age of Onset  . Heart attack Mother   . Heart disease Mother   . Hyperlipidemia Mother   . Hypertension  Mother   . Heart attack Father   . Heart disease Father     Before age 56  . Hyperlipidemia Father   . Hypertension Father   . Cancer Sister   . Aneurysm Brother   . Cancer Brother    Family Psychiatric  History:   unknown Social  History:  History  Alcohol Use No     History  Drug Use No    Social History   Social History  . Marital Status: Single    Spouse Name: N/A  . Number of Children: N/A  . Years of Education: N/A   Social History Main Topics  . Smoking status: Current Every Day Smoker -- 0.50 packs/day    Types: Cigarettes    Last Attempt to Quit: 01/27/2014  . Smokeless tobacco: Never Used  . Alcohol Use: No  . Drug Use: No  . Sexual Activity: Not Asked   Other Topics Concern  . None   Social History Narrative   Additional Social History:    Pain Medications: See PTA Prescriptions: See PTA Over the Counter: See PTA History of alcohol / drug use?: No history of alcohol / drug abuse     Allergies:  No Known Allergies  Labs:  Results for orders placed or performed during the hospital encounter of 08/27/15 (from the past 48 hour(s))  CBC with Differential     Status: Abnormal   Collection Time: 08/27/15  4:03 PM  Result Value Ref Range   WBC 9.7 4.0 - 10.5 K/uL   RBC 3.41 (L) 3.87 - 5.11 MIL/uL   Hemoglobin 9.3 (L) 12.0 - 15.0 g/dL   HCT 28.2 (L) 36.0 - 46.0 %   MCV 82.7 78.0 - 100.0 fL   MCH 27.3 26.0 - 34.0 pg   MCHC 33.0 30.0 - 36.0 g/dL   RDW 17.6 (H) 11.5 - 15.5 %   Platelets 743 (H) 150 - 400 K/uL   Neutrophils Relative % 69 %   Lymphocytes Relative 20 %   Monocytes Relative 10 %   Eosinophils Relative 1 %   Basophils Relative 0 %   Neutro Abs 6.7 1.7 - 7.7 K/uL   Lymphs Abs 1.9 0.7 - 4.0 K/uL   Monocytes Absolute 1.0 0.1 - 1.0 K/uL   Eosinophils Absolute 0.1 0.0 - 0.7 K/uL   Basophils Absolute 0.0 0.0 - 0.1 K/uL   RBC Morphology POLYCHROMASIA PRESENT     Comment: TARGET CELLS  Basic metabolic panel     Status: Abnormal   Collection Time: 08/27/15  4:03 PM  Result Value Ref Range   Sodium 138 135 - 145 mmol/L   Potassium 6.0 (H) 3.5 - 5.1 mmol/L   Chloride 107 101 - 111 mmol/L   CO2 22 22 - 32 mmol/L   Glucose, Bld 137 (H) 65 - 99 mg/dL   BUN 39 (H) 6 - 20  mg/dL   Creatinine, Ser 1.28 (H) 0.44 - 1.00 mg/dL   Calcium 9.2 8.9 - 10.3 mg/dL   GFR calc non Af Amer 41 (L) >60 mL/min   GFR calc Af Amer 47 (L) >60 mL/min    Comment: (NOTE) The eGFR has been calculated using the CKD EPI equation. This calculation has not been validated in all clinical situations. eGFR's persistently <60 mL/min signify possible Chronic Kidney Disease.    Anion gap 9 5 - 15  Ethanol     Status: None   Collection Time: 08/27/15  4:03 PM  Result Value Ref Range  Alcohol, Ethyl (B) <5 <5 mg/dL    Comment:        LOWEST DETECTABLE LIMIT FOR SERUM ALCOHOL IS 5 mg/dL FOR MEDICAL PURPOSES ONLY   Urine rapid drug screen (hosp performed)     Status: Abnormal   Collection Time: 08/27/15  6:28 PM  Result Value Ref Range   Opiates POSITIVE (A) NONE DETECTED   Cocaine NONE DETECTED NONE DETECTED   Benzodiazepines NONE DETECTED NONE DETECTED   Amphetamines NONE DETECTED NONE DETECTED   Tetrahydrocannabinol NONE DETECTED NONE DETECTED   Barbiturates NONE DETECTED NONE DETECTED    Comment:        DRUG SCREEN FOR MEDICAL PURPOSES ONLY.  IF CONFIRMATION IS NEEDED FOR ANY PURPOSE, NOTIFY LAB WITHIN 5 DAYS.        LOWEST DETECTABLE LIMITS FOR URINE DRUG SCREEN Drug Class       Cutoff (ng/mL) Amphetamine      1000 Barbiturate      200 Benzodiazepine   732 Tricyclics       202 Opiates          300 Cocaine          300 THC              50   Basic metabolic panel     Status: Abnormal   Collection Time: 08/27/15  7:10 PM  Result Value Ref Range   Sodium 136 135 - 145 mmol/L   Potassium 5.4 (H) 3.5 - 5.1 mmol/L   Chloride 104 101 - 111 mmol/L   CO2 23 22 - 32 mmol/L   Glucose, Bld 164 (H) 65 - 99 mg/dL   BUN 37 (H) 6 - 20 mg/dL   Creatinine, Ser 1.28 (H) 0.44 - 1.00 mg/dL   Calcium 9.0 8.9 - 10.3 mg/dL   GFR calc non Af Amer 41 (L) >60 mL/min   GFR calc Af Amer 47 (L) >60 mL/min    Comment: (NOTE) The eGFR has been calculated using the CKD EPI equation. This  calculation has not been validated in all clinical situations. eGFR's persistently <60 mL/min signify possible Chronic Kidney Disease.    Anion gap 9 5 - 15    Current Facility-Administered Medications  Medication Dose Route Frequency Provider Last Rate Last Dose  . amLODipine (NORVASC) tablet 10 mg  10 mg Oral Daily Alphonzo Severance, RPH   10 mg at 08/28/15 1034  . citalopram (CELEXA) tablet 40 mg  40 mg Oral Daily Harvel Quale, MD   40 mg at 08/28/15 1034  . hydrochlorothiazide (HYDRODIURIL) tablet 25 mg  25 mg Oral Daily Alphonzo Severance, RPH   25 mg at 08/28/15 1034  . irbesartan (AVAPRO) tablet 300 mg  300 mg Oral Daily Alphonzo Severance, RPH   300 mg at 08/28/15 1034  . LORazepam (ATIVAN) tablet 0.5 mg  0.5 mg Oral QHS Harvel Quale, MD   0.5 mg at 08/28/15 0209  . oxyCODONE (Oxy IR/ROXICODONE) immediate release tablet 10 mg  10 mg Oral BID PRN Harvel Quale, MD   10 mg at 08/28/15 0430  . oxyCODONE (Oxy IR/ROXICODONE) immediate release tablet 5 mg  5 mg Oral BID PRN Quintella Reichert, MD   5 mg at 08/28/15 1122  . rosuvastatin (CRESTOR) tablet 10 mg  10 mg Oral q1800 Harvel Quale, MD       Current Outpatient Prescriptions  Medication Sig Dispense Refill  . Amlodipine-Valsartan-HCTZ 10-320-25 MG TABS Take 1 tablet by mouth  daily.    . aspirin-acetaminophen-caffeine (EXCEDRIN MIGRAINE) 250-250-65 MG per tablet Take 1 tablet by mouth every 6 (six) hours as needed for headache.    . citalopram (CELEXA) 40 MG tablet Take 40 mg by mouth daily.  0  . ferrous sulfate 325 (65 FE) MG tablet Take 325 mg by mouth daily with breakfast.    . LORazepam (ATIVAN) 1 MG tablet Take 0.5 tablets (0.5 mg total) by mouth at bedtime. Take as prescribed. Do NOT take greater or more frequently then prescribed. Do NOT take with other sedating medications or ANY alcohol as this can result in death. This medication can impair coordination and reflexes, and cause drowsiness. Do NOT perform tasks in  which this would place you in danger as it can make you a FALL RISK. 10 tablet 0  . Multiple Vitamin (MULTIVITAMIN WITH MINERALS) TABS tablet Take 1 tablet by mouth daily. One a Day Women's    . OVER THE COUNTER MEDICATION Apply 1 patch topically at bedtime as needed (pain). Over the counter lidocaine patches    . Oxycodone HCl 10 MG TABS Take 1 tablet (10 mg total) by mouth 2 (two) times daily as needed (pain). 20 tablet 0  . rosuvastatin (CRESTOR) 10 MG tablet Take 10 mg by mouth daily.     . traZODone (DESYREL) 50 MG tablet Take 50 mg by mouth at bedtime.    . predniSONE (DELTASONE) 20 MG tablet Take 60 mg daily x 2 days then 40 mg daily x 2 days then 20 mg daily x 2 days (Patient taking differently: Take 20-60 mg by mouth daily with breakfast. Take 3 tablets (60 mg) daily x 2 days then 2 tablets (40 mg) daily x 2 days then 1 tablet (20 mg) daily x 2 days) 12 tablet 0    Musculoskeletal: Strength & Muscle Tone: seen in bed, uses crutches after back surgery but is self care. Gait & Station: see above note Patient leans: see above  Psychiatric Specialty Exam: Review of Systems  Constitutional: Negative.   HENT: Negative.   Eyes: Negative.   Respiratory: Negative.   Cardiovascular: Negative.   Gastrointestinal: Negative.   Genitourinary: Negative.   Musculoskeletal:       Recent back Surgery and pending knee surgery.  Skin: Negative.   Neurological: Negative.   Endo/Heme/Allergies: Negative.     Blood pressure 146/78, pulse 97, temperature 97.7 F (36.5 C), temperature source Oral, resp. rate 19, SpO2 98 %.There is no weight on file to calculate BMI.  General Appearance: Casual and Fairly Groomed  Eye Contact::  Good  Speech:  Clear and Coherent and Normal Rate  Volume:  Normal  Mood:  Anxious and Depressed  Affect:  Congruent and Depressed  Thought Process:  Coherent, Goal Directed and Intact  Orientation:  Full (Time, Place, and Person)  Thought Content:  WDL  Suicidal  Thoughts:  No  Homicidal Thoughts:  No  Memory:  Immediate;   Good Recent;   Good Remote;   Good  Judgement:  Good  Insight:  Good  Psychomotor Activity:  Normal  Concentration:  Good  Recall:  NA  Fund of Knowledge:Good  Language: Good  Akathisia:  NA  Handed:  Right  AIMS (if indicated):     Assets:  Desire for Improvement  ADL's:  Intact  Cognition: WNL  Sleep:      Treatment Plan Summary: Daily contact with patient to assess and evaluate symptoms and progress in treatment and Medication management  Disposition:  Accepted for admission and we will seek placement at any hospital with available beds including a [facility] psychiatric unit.  We will resume all home medications.  Delfin Gant 08/28/2015 3:43 PM Patient seen face-to-face for psychiatric evaluation, chart reviewed and case discussed with the physician extender and developed treatment plan. Reviewed the information documented and agree with the treatment plan. Corena Pilgrim, MD

## 2015-08-29 DIAGNOSIS — R531 Weakness: Secondary | ICD-10-CM | POA: Diagnosis not present

## 2015-08-29 MED ORDER — ZOLPIDEM TARTRATE 5 MG PO TABS
5.0000 mg | ORAL_TABLET | Freq: Every evening | ORAL | Status: DC | PRN
  Administered 2015-08-29: 5 mg via ORAL
  Filled 2015-08-29: qty 1

## 2015-08-29 MED ORDER — LIDOCAINE 5 % EX PTCH
1.0000 | MEDICATED_PATCH | CUTANEOUS | Status: DC
  Administered 2015-08-29 – 2015-08-30 (×2): 1 via TRANSDERMAL
  Filled 2015-08-29 (×3): qty 1

## 2015-08-29 MED ORDER — LORAZEPAM 1 MG PO TABS
1.0000 mg | ORAL_TABLET | Freq: Once | ORAL | Status: AC
  Administered 2015-08-29: 1 mg via ORAL
  Filled 2015-08-29: qty 1

## 2015-08-29 MED ORDER — TRAZODONE HCL 50 MG PO TABS: 50.0000 mg | ORAL_TABLET | Freq: Every evening | ORAL | Status: DC | PRN

## 2015-08-29 MED ORDER — ZOLPIDEM TARTRATE 5 MG PO TABS
5.0000 mg | ORAL_TABLET | Freq: Every day | ORAL | Status: DC
  Administered 2015-08-29: 5 mg via ORAL
  Filled 2015-08-29: qty 1

## 2015-08-29 NOTE — BH Assessment (Signed)
Alyssa [name] Assessment Progress Note  The following facilities have been contacted to seek placement for this pt, with results as noted:  Beds available, information sent, decision pending:  Alyssa [name]  Declined:  Alyssa [name] (lack of criteria) Alyssa [name] (lack of criteria)  Alyssa [name], [place] Triage Specialist [phone]

## 2015-08-29 NOTE — ED Notes (Signed)
Patient ambulated in hall with crutches

## 2015-08-29 NOTE — ED Notes (Signed)
Pt called out and stated that she could not sleep and asked if she could have an Ambien like she had last evening.  Dr Leonides Schanz consulted and agreed that it was appropriate.

## 2015-08-29 NOTE — Consult Note (Signed)
[facility]iatry Consult   Reason for Consult:  Recurrent Depression Referring Physician:  EDP Patient Identification: Alyssa [name] MRN:  [id number] Principal Diagnosis: Severe recurrent major depression without psychotic features [facility]) Diagnosis:   Patient Active Problem List   Diagnosis Date Noted  . Severe recurrent major depression without psychotic features ([place]) [F33.2] 08/28/2015    Priority: High  . Spondylolisthesis at L4-L5 level [M43.16] 07/30/2015  . PVD (peripheral vascular disease) with claudication ([place]) [I73.9] 07/29/2014  . Weakness-Bilateral arm/leg [R53.1] 07/29/2014  . Numbness-left leg [R20.0] 07/29/2014  . Swelling of limb-Legs [M79.89] 07/29/2014  . Atherosclerosis of native arteries of the extremities with intermittent claudication [I70.219] 09/30/2011    Total Time spent with patient: 25 minutes  Subjective:   THERESEA [name] is a 74 y.o. female patient admitted with Recurrent depression. Pt seen and chart reviewed with Dr. Darleene Cleaver and NP team. Pt is alert/oriented x4, calm, cooperative, yet still rating depression at 7-8/10. Pt in agreement to spend the night so we can assist with outpatient follow-up in addition to pt's symptoms continuing to improve and depression rating decreasing. Pt denies suicidal/homicidal ideation at this time; denies psychosis. Does not appear to be responding to internal stimuli.   HPI: AA female, 74 years old was evaluated for recurrent depression.  Patient was seen and discharged from [facility] two days ago.  She was given outpatient referral for a Psychiatrist.  Patient states that she called every provider on her list but none of them could see her before January.   Patient came back yesterday because she need her antidepressant restarted and increased.  Patient denies SI/HI/AVH. She reports poor sleep and appetite.  Patient had back surgery recently and ambulates with her crutches.  Patient also has a  pending knee surgery coming up.  Patient has been accepted for admission and we will be seeking placement at any facility with available beds.   Past Psychiatric History:   Depression  Risk to Self: Suicidal Ideation: No Suicidal Intent: No Is patient at risk for suicide?: No Suicidal Plan?: No Access to Means: No What has been your use of drugs/alcohol within the last 12 months?: None How many times?: 0 Other Self Harm Risks: Denies Triggers for Past Attempts: Unknown Intentional Self Injurious Behavior: None Risk to Others: Homicidal Ideation: No Thoughts of Harm to Others: No Current Homicidal Intent: No Current Homicidal Plan: No Access to Homicidal Means: No Identified Victim: N/A History of harm to others?: No Assessment of Violence: None Noted Violent Behavior Description: Denies Does patient have access to weapons?: No Criminal Charges Pending?: No Does patient have a court date: No Prior Inpatient Therapy: Prior Inpatient Therapy: Yes Prior Therapy Dates: 2013 Prior Therapy Facilty/Provider(s): [facility] Reason for Treatment: Depression Prior Outpatient Therapy: Prior Outpatient Therapy: No Prior Therapy Dates: N/A Prior Therapy Facilty/Provider(s): N/A Reason for Treatment: N/A Does patient have an ACCT team?: No Does patient have Intensive In-House Services?  : No Does patient have [facility] services? : No Does patient have P4CC services?: No  Past Medical History:  Past Medical History  Diagnosis Date  . Hyperlipidemia   . Hypertension   . Leg pain   . Joint pain   . Headache(784.0)   . Anxiety   . Arthritis   . Peripheral vascular disease [facility])     Past Surgical History  Procedure Laterality Date  . Total hip arthroplasty      right  . 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 Joint  replacement Right 2012    Hip  . Abdominal angiogram N/A 10/29/2011    Procedure: ABDOMINAL ANGIOGRAM;  Surgeon: Elam Dutch, MD;  Location: [facility] CATH LAB;  Service:  Cardiovascular;  Laterality: N/A;   Family History:  Family History  Problem Relation Age of Onset  . Heart attack Mother   . Heart disease Mother   . Hyperlipidemia Mother   . Hypertension Mother   . Heart attack Father   . Heart disease Father     Before age 64  . Hyperlipidemia Father   . Hypertension Father   . Cancer Sister   . Aneurysm Brother   . Cancer Brother    Family Psychiatric  History:   unknown Social History:  History  Alcohol Use No     History  Drug Use No    Social History   Social History  . Marital Status: Single    Spouse Name: N/A  . Number of Children: N/A  . Years of Education: N/A   Social History Main Topics  . Smoking status: Current Every Day Smoker -- 0.50 packs/day    Types: Cigarettes    Last Attempt to Quit: 01/27/2014  . Smokeless tobacco: Never Used  . Alcohol Use: No  . Drug Use: No  . Sexual Activity: Not Asked   Other Topics Concern  . None   Social History Narrative   Additional Social History:    Pain Medications: See PTA Prescriptions: See PTA Over the Counter: See PTA History of alcohol / drug use?: No history of alcohol / drug abuse     Allergies:  No Known Allergies  Labs:  Results for orders placed or performed during the hospital encounter of 08/27/15 (from the past 48 hour(s))  CBC with Differential     Status: Abnormal   Collection Time: 08/27/15  4:03 PM  Result Value Ref Range   WBC 9.7 4.0 - 10.5 K/uL   RBC 3.41 (L) 3.87 - 5.11 MIL/uL   Hemoglobin 9.3 (L) 12.0 - 15.0 g/dL   HCT 28.2 (L) 36.0 - 46.0 %   MCV 82.7 78.0 - 100.0 fL   MCH 27.3 26.0 - 34.0 pg   MCHC 33.0 30.0 - 36.0 g/dL   RDW 17.6 (H) 11.5 - 15.5 %   Platelets 743 (H) 150 - 400 K/uL   Neutrophils Relative % 69 %   Lymphocytes Relative 20 %   Monocytes Relative 10 %   Eosinophils Relative 1 %   Basophils Relative 0 %   Neutro Abs 6.7 1.7 - 7.7 K/uL   Lymphs Abs 1.9 0.7 - 4.0 K/uL   Monocytes Absolute 1.0 0.1 - 1.0 K/uL    Eosinophils Absolute 0.1 0.0 - 0.7 K/uL   Basophils Absolute 0.0 0.0 - 0.1 K/uL   RBC Morphology POLYCHROMASIA PRESENT     Comment: TARGET CELLS  Basic metabolic panel     Status: Abnormal   Collection Time: 08/27/15  4:03 PM  Result Value Ref Range   Sodium 138 135 - 145 mmol/L   Potassium 6.0 (H) 3.5 - 5.1 mmol/L   Chloride 107 101 - 111 mmol/L   CO2 22 22 - 32 mmol/L   Glucose, Bld 137 (H) 65 - 99 mg/dL   BUN 39 (H) 6 - 20 mg/dL   Creatinine, Ser 1.28 (H) 0.44 - 1.00 mg/dL   Calcium 9.2 8.9 - 10.3 mg/dL   GFR calc non Af Amer 41 (L) >60 mL/min   GFR calc Af Wyvonnia Lora  47 (L) >60 mL/min    Comment: (NOTE) The eGFR has been calculated using the CKD EPI equation. This calculation has not been validated in all clinical situations. eGFR's persistently <60 mL/min signify possible Chronic Kidney Disease.    Anion gap 9 5 - 15  Ethanol     Status: None   Collection Time: 08/27/15  4:03 PM  Result Value Ref Range   Alcohol, Ethyl (B) <5 <5 mg/dL    Comment:        LOWEST DETECTABLE LIMIT FOR SERUM ALCOHOL IS 5 mg/dL FOR MEDICAL PURPOSES ONLY   Urine rapid drug screen (hosp performed)     Status: Abnormal   Collection Time: 08/27/15  6:28 PM  Result Value Ref Range   Opiates POSITIVE (A) NONE DETECTED   Cocaine NONE DETECTED NONE DETECTED   Benzodiazepines NONE DETECTED NONE DETECTED   Amphetamines NONE DETECTED NONE DETECTED   Tetrahydrocannabinol NONE DETECTED NONE DETECTED   Barbiturates NONE DETECTED NONE DETECTED    Comment:        DRUG SCREEN FOR MEDICAL PURPOSES ONLY.  IF CONFIRMATION IS NEEDED FOR ANY PURPOSE, NOTIFY LAB WITHIN 5 DAYS.        LOWEST DETECTABLE LIMITS FOR URINE DRUG SCREEN Drug Class       Cutoff (ng/mL) Amphetamine      1000 Barbiturate      200 Benzodiazepine   166 Tricyclics       063 Opiates          300 Cocaine          300 THC              50   Basic metabolic panel     Status: Abnormal   Collection Time: 08/27/15  7:10 PM  Result Value  Ref Range   Sodium 136 135 - 145 mmol/L   Potassium 5.4 (H) 3.5 - 5.1 mmol/L   Chloride 104 101 - 111 mmol/L   CO2 23 22 - 32 mmol/L   Glucose, Bld 164 (H) 65 - 99 mg/dL   BUN 37 (H) 6 - 20 mg/dL   Creatinine, Ser 1.28 (H) 0.44 - 1.00 mg/dL   Calcium 9.0 8.9 - 10.3 mg/dL   GFR calc non Af Amer 41 (L) >60 mL/min   GFR calc Af Amer 47 (L) >60 mL/min    Comment: (NOTE) The eGFR has been calculated using the CKD EPI equation. This calculation has not been validated in all clinical situations. eGFR's persistently <60 mL/min signify possible Chronic Kidney Disease.    Anion gap 9 5 - 15    Current Facility-Administered Medications  Medication Dose Route Frequency Provider Last Rate Last Dose  . amLODipine (NORVASC) tablet 10 mg  10 mg Oral Daily Alphonzo Severance, RPH   10 mg at 08/29/15 0920  . citalopram (CELEXA) tablet 40 mg  40 mg Oral Daily Harvel Quale, MD   40 mg at 08/29/15 0920  . hydrochlorothiazide (HYDRODIURIL) tablet 25 mg  25 mg Oral Daily Alphonzo Severance, RPH   25 mg at 08/29/15 0920  . irbesartan (AVAPRO) tablet 300 mg  300 mg Oral Daily Alphonzo Severance, RPH   300 mg at 08/29/15 0160  . lidocaine (LIDODERM) 5 % 1 patch  1 patch Transdermal Q24H Debby Freiberg, MD   1 patch at 08/29/15 [phone]  . oxyCODONE (Oxy IR/ROXICODONE) immediate release tablet 10 mg  10 mg Oral BID PRN Harvel Quale, MD   10 mg  at 08/29/15 0446  . oxyCODONE (Oxy IR/ROXICODONE) immediate release tablet 5 mg  5 mg Oral BID PRN Quintella Reichert, MD   5 mg at 08/29/15 0921  . rosuvastatin (CRESTOR) tablet 10 mg  10 mg Oral q1800 Harvel Quale, MD   10 mg at 08/28/15 1713  . traZODone (DESYREL) tablet 50 mg  50 mg Oral QHS PRN Trayvion Embleton      . zolpidem (AMBIEN) tablet 5 mg  5 mg Oral QHS Jentzen Minasyan       Current Outpatient Prescriptions  Medication Sig Dispense Refill  . Amlodipine-Valsartan-HCTZ 10-320-25 MG TABS Take 1 tablet by mouth daily.    Marland Kitchen aspirin-acetaminophen-caffeine  (EXCEDRIN MIGRAINE) 250-250-65 MG per tablet Take 1 tablet by mouth every 6 (six) hours as needed for headache.    . citalopram (CELEXA) 40 MG tablet Take 40 mg by mouth daily.  0  . ferrous sulfate 325 (65 FE) MG tablet Take 325 mg by mouth daily with breakfast.    . LORazepam (ATIVAN) 1 MG tablet Take 0.5 tablets (0.5 mg total) by mouth at bedtime. Take as prescribed. Do NOT take greater or more frequently then prescribed. Do NOT take with other sedating medications or ANY alcohol as this can result in death. This medication can impair coordination and reflexes, and cause drowsiness. Do NOT perform tasks in which this would place you in danger as it can make you a FALL RISK. 10 tablet 0  . Multiple Vitamin (MULTIVITAMIN WITH MINERALS) TABS tablet Take 1 tablet by mouth daily. One a Day Women's    . OVER THE COUNTER MEDICATION Apply 1 patch topically at bedtime as needed (pain). Over the counter lidocaine patches    . Oxycodone HCl 10 MG TABS Take 1 tablet (10 mg total) by mouth 2 (two) times daily as needed (pain). 20 tablet 0  . rosuvastatin (CRESTOR) 10 MG tablet Take 10 mg by mouth daily.     . traZODone (DESYREL) 50 MG tablet Take 50 mg by mouth at bedtime.    . predniSONE (DELTASONE) 20 MG tablet Take 60 mg daily x 2 days then 40 mg daily x 2 days then 20 mg daily x 2 days (Patient taking differently: Take 20-60 mg by mouth daily with breakfast. Take 3 tablets (60 mg) daily x 2 days then 2 tablets (40 mg) daily x 2 days then 1 tablet (20 mg) daily x 2 days) 12 tablet 0    Musculoskeletal: Strength & Muscle Tone: seen in bed, uses crutches after back surgery but is self care. Gait & Station: see above note Patient leans: see above  Psychiatric Specialty Exam: Review of Systems  Constitutional: Negative.   HENT: Negative.   Eyes: Negative.   Respiratory: Negative.   Cardiovascular: Negative.   Gastrointestinal: Negative.   Genitourinary: Negative.   Musculoskeletal:       Recent back  Surgery and pending knee surgery.  Skin: Negative.   Neurological: Negative.   Endo/Heme/Allergies: Negative.     Blood pressure 129/67, pulse 84, temperature 98.3 F (36.8 C), temperature source Oral, resp. rate 18, SpO2 99 %.There is no weight on file to calculate BMI.  General Appearance: Casual and Fairly Groomed  Eye Contact::  Good  Speech:  Clear and Coherent and Normal Rate  Volume:  Normal  Mood:  Anxious and Depressed  Affect:  Congruent and Depressed  Thought Process:  Coherent, Goal Directed and Intact  Orientation:  Full (Time, Place, and Person)  Thought Content:  WDL  Suicidal Thoughts:  No  Homicidal Thoughts:  No  Memory:  Immediate;   Good Recent;   Good Remote;   Good  Judgement:  Good  Insight:  Good  Psychomotor Activity:  Normal  Concentration:  Good  Recall:  NA  Fund of Knowledge:Good  Language: Good  Akathisia:  NA  Handed:  Right  AIMS (if indicated):     Assets:  Desire for Improvement  ADL's:  Intact  Cognition: WNL  Sleep:      Treatment Plan Summary: Daily contact with patient to assess and evaluate symptoms and progress in treatment and Medication management  Disposition:   -Pt will observe overnight for probable discharge in AM. Pt has improved and may be stable for discharge tomorrow.   Benjamine Mola, FNP-BC 08/29/2015 12:27 PM Patient seen face-to-face for psychiatric evaluation, chart reviewed and case discussed with the physician extender and developed treatment plan. Reviewed the information documented and agree with the treatment plan. Corena Pilgrim, MD

## 2015-08-29 NOTE — Progress Notes (Addendum)
Pt is very cooperative this am,. She rates her pain a 5/10 after receiving pain medication. Pt stated she is very worried that she now has to have a knee replacement due to gout. She said, "if I don't do the surgery I will never be able to walk and the pain is so bad now." Pt did take a shower with assistance and tolerated well. She is up in the cardiac chair watching TV,Pt does contract for safety and denies SI and HI.pt ate 100% of her lunch. 1:30p Pts son phoned and pt returned the call. Presently she is watching TV. 3:20pm-Pt stated her knee pain is a 8/10. She was medicated with oxy and given heat packs. Phoned EDP concerning the pts c/o gout in her knees. (6:25p) EDP will evaluate. Pt ate about 50% of her dinner. 7p-Pt was given 10mg  of oxy for bilateral knee pain a 8/10. Pt does have her niece here to visit.

## 2015-08-30 DIAGNOSIS — R531 Weakness: Secondary | ICD-10-CM | POA: Diagnosis not present

## 2015-08-30 MED ORDER — HYDROCODONE-ACETAMINOPHEN 5-325 MG PO TABS: 1.0000 | ORAL_TABLET | Freq: Four times a day (QID) | ORAL | Status: DC | PRN

## 2015-08-30 MED ORDER — POLYETHYLENE GLYCOL 3350 17 G PO PACK
17.0000 g | PACK | Freq: Every day | ORAL | Status: DC
  Filled 2015-08-30: qty 1

## 2015-08-30 MED ORDER — TRAZODONE HCL 50 MG PO TABS: 50.0000 mg | ORAL_TABLET | Freq: Every evening | ORAL | Status: DC | PRN

## 2015-08-30 MED ORDER — PREDNISONE 10 MG PO TABS: ORAL_TABLET | ORAL | Status: DC

## 2015-08-30 MED ORDER — CITALOPRAM HYDROBROMIDE 40 MG PO TABS: 40.0000 mg | ORAL_TABLET | Freq: Every day | ORAL | Status: DC

## 2015-08-30 MED ORDER — KETOROLAC TROMETHAMINE 30 MG/ML IJ SOLN
30.0000 mg | Freq: Once | INTRAMUSCULAR | Status: AC
  Administered 2015-08-30: 30 mg via INTRAMUSCULAR
  Filled 2015-08-30: qty 1

## 2015-08-30 MED ORDER — PREDNISONE 20 MG PO TABS
60.0000 mg | ORAL_TABLET | Freq: Once | ORAL | Status: AC
  Administered 2015-08-30: 60 mg via ORAL
  Filled 2015-08-30: qty 3

## 2015-08-30 MED ORDER — POLYETHYLENE GLYCOL 3350 17 G PO PACK: 17.0000 g | PACK | Freq: Every day | ORAL | Status: DC

## 2015-08-30 NOTE — [facility] Suicide Risk Assessment (Cosign Needed)
Suicide Risk Assessment  Discharge Assessment   [facility] Discharge Suicide Risk Assessment   Demographic Factors:  Age 74 or older, Low socioeconomic status and Unemployed  Total Time spent with patient: 20 minutes  Musculoskeletal: Strength & Muscle Tone: S/P Back surgery, uses crutches Gait & Station: see above Patient leans: uses crutches after back surgery  Psychiatric Specialty Exam:     Blood pressure 141/79, pulse 78, temperature 98 F (36.7 C), temperature source Oral, resp. rate 18, SpO2 100 %.There is no weight on file to calculate BMI.  General Appearance: Casual and Fairly Groomed  Engineer, water:: Good  Speech: Clear and Coherent and Normal Rate  Volume: Normal  Mood: Anxious and Depressed  Affect: Congruent and Depressed  Thought Process: Coherent, Goal Directed and Intact  Orientation: Full (Time, Place, and Person)  Thought Content: WDL  Suicidal Thoughts: No  Homicidal Thoughts: No  Memory: Immediate; Good Recent; Good Remote; Good  Judgement: Good  Insight: Good  Psychomotor Activity: Normal  Concentration: Good  Recall: NA  Fund of Knowledge:Good  Language: Good  Akathisia: NA  Handed: Right  AIMS (if indicated):   Assets: Desire for Improvement  ADL's: Intact  Cognition: WNL                  Has this patient used any form of tobacco in the last 30 days? (Cigarettes, Smokeless Tobacco, Cigars, and/or Pipes) N/A  Mental Status Per Nursing Assessment::   On Admission:     Current Mental Status by Physician: NA  Loss Factors: NA  Historical Factors: NA  Risk Reduction Factors:   Religious beliefs about death, Living with another person, especially a relative, Positive social support and Positive coping skills or problem solving skills  Continued Clinical Symptoms:  Depression:   Insomnia  Cognitive Features That Contribute To Risk:  None    Suicide Risk:   Minimal: No identifiable suicidal ideation.  Patients presenting with no risk factors but with morbid ruminations; may be classified as minimal risk based on the severity of the depressive symptoms  Principal Problem: Severe recurrent major depression without psychotic features [facility]) Discharge Diagnoses:  Patient Active Problem List   Diagnosis Date Noted  . Severe recurrent major depression without psychotic features ([place]) [F33.2] 08/28/2015    Priority: Medium  . Spondylolisthesis at L4-L5 level [M43.16] 07/30/2015  . PVD (peripheral vascular disease) with claudication ([place]) [I73.9] 07/29/2014  . Weakness-Bilateral arm/leg [R53.1] 07/29/2014  . Numbness-left leg [R20.0] 07/29/2014  . Swelling of limb-Legs [M79.89] 07/29/2014  . Atherosclerosis of native arteries of the extremities with intermittent claudication [I70.219] 09/30/2011      Plan Of Care/Follow-up recommendations:  Activity:  as tolerated Diet:  Regular  Is patient on multiple antipsychotic therapies at discharge:  No   Has Patient had three or more failed trials of antipsychotic monotherapy by history:  No  Recommended Plan for Multiple Antipsychotic Therapies: NA    Kash Davie C   PMHNP-BC 08/30/2015, 11:44 AM

## 2015-08-30 NOTE — ED Notes (Signed)
Pt given heat packs for her knees.

## 2015-08-30 NOTE — Consult Note (Signed)
Psychiatric Specialty Exam: Physical Exam  ROS  Blood pressure 147/80, pulse 72, temperature 98.6 F (37 C), temperature source Oral, resp. rate 20, SpO2 99 %.There is no weight on file to calculate BMI.  General Appearance: Casual and Fairly Groomed  Eye Contact:: Good  Speech: Clear and Coherent and Normal Rate  Volume: Normal  Mood: Anxious and Depressed  Affect: Congruent and Depressed  Thought Process: Coherent, Goal Directed and Intact  Orientation: Full (Time, Place, and Person)  Thought Content: WDL  Suicidal Thoughts: No  Homicidal Thoughts: No  Memory: Immediate; Good Recent; Good Remote; Good  Judgement: Good  Insight: Good  Psychomotor Activity: Normal  Concentration: Good  Recall: NA  Fund of Knowledge:Good  Language: Good  Akathisia: NA  Handed: Right  AIMS (if indicated):    Assets: Desire for Improvement  ADL's: Intact  Cognition: WNL           Patient is seen this morning alert and oriented x4.  She is calm and pleasant.  She denies SI/HI/AVH.  She reports feeling better today and will continue to take her Celexa.  She has been given information for 3 providers to follow up within the area.   Patient will also follow up with her Orthopedic doctor. She denies SI/HI/AVH.  Patient reports strong family support and lives with her brother and a friend.  [place] Daughter is also supportive and is willing to take her home.  Patient is discharge home now.  Severe recurrent major depression without psychotic features ([place])   Plan:  Discharge home.  Charmaine Downs   PMHNP-BC  Patient seen, chart reviewed and case discussed with the physician extender and formulated treatment plan. Reviewed the information documented by physician extender and agree with the treatment plan.  Tanishi Nault,JANARDHAHA R. 08/30/2015 4:18 PM

## 2015-08-30 NOTE — ED Notes (Signed)
Pt called out and stated she needed pain medication.  Pt given pain medication

## 2015-08-30 NOTE — Progress Notes (Signed)
11:19am. CSW met with pt to discuss outpatient resources, as pt has been cleared for discharge by psych team. CSW provided outpatient psychiatry and counseling list, highlighting those that take her insurance and those that can more likely get her in quickly. Pt thanked CSW for resources. Pt has a ride home with family. CSW to sign off.  [place] Worker [facility] phone: [phone]

## 2015-08-30 NOTE — ED Provider Notes (Signed)
Called to evaluate patient's knee.  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  Left knee mild swollen and tender but no erythema and not irritable.  Prednisone, Toradol ordered and patient's Oxycodone continued.  Also, concern for constipation.  Patient with soft, non distended, non tender abdomen.  [place]ira lax ordered.  Harvel Quale, MD 08/30/15 [phone]

## 2015-08-30 NOTE — Progress Notes (Addendum)
Pt is crying stating ,'My knee hurts really bad." Pt has severe swelling to the left knee and it is warm to the touch . Both legs appears warm but w/o reddness. EDP made aware by off-going shift, Dub Mikes. Pt reassured we would get her pain under control. Pt stated she has been up all night.Pt has positive pedal pulses. Per Dr. Rick Duff pt received  Pain medication and will take predisone and a shot of tordol as well. Pt was offered a cup of coffee and appears calmer now. Pt stated she has not had a BM in a few days. EDP aware-11am pt has a visitor and will be discharged today. Pt stated she is painfree now. She denies SI and HI and contracts for safety. 11:20a-Phoned EDP to write a RX for prednisone for pts left knee. Pt is for discharge. She told the writer she is out of her oxy pain medication.EDP made aware and will write for another pain med and pt was instructed she must contact the MD that gave her the medication.Phoned the oncall neurosurgeon , Dr.  Herold Harms and am awaiting a call back.

## 2015-09-09 ENCOUNTER — Ambulatory Visit (HOSPITAL_COMMUNITY)
Admission: RE | Admit: 2015-09-09 | Discharge: 2015-09-09 | Disposition: A | Discharge status: Home / Self Care | Payer: Medicare Other | Source: Ambulatory Visit | Attending: Gastroenterology | Admitting: Gastroenterology

## 2015-09-09 DIAGNOSIS — R1011 Right upper quadrant pain: Secondary | ICD-10-CM | POA: Insufficient documentation

## 2015-09-09 MED ORDER — SINCALIDE 5 MCG IJ SOLR
INTRAMUSCULAR | Status: AC
  Administered 2015-09-09: 0.9 ug via INTRAVENOUS
  Filled 2015-09-09: qty 5

## 2015-09-09 MED ORDER — TECHNETIUM TC 99M MEBROFENIN IV KIT
5.2000 | PACK | Freq: Once | INTRAVENOUS | Status: DC | PRN
  Administered 2015-09-09: 5 via INTRAVENOUS
  Filled 2015-09-09: qty 6

## 2015-09-09 MED ORDER — STERILE WATER FOR INJECTION IJ SOLN
INTRAMUSCULAR | Status: AC
  Administered 2015-09-09: 5 mL
  Filled 2015-09-09: qty 10

## 2015-09-09 MED ORDER — SINCALIDE 5 MCG IJ SOLR
0.0200 ug/kg | Freq: Once | INTRAMUSCULAR | Status: AC
  Administered 2015-09-09: 0.9 ug via INTRAVENOUS

## 2015-09-16 ENCOUNTER — Encounter (HOSPITAL_COMMUNITY)
Admission: RE | Admit: 2015-09-16 | Discharge: 2015-09-16 | Disposition: A | Discharge status: Home / Self Care | Payer: Medicare Other | Source: Ambulatory Visit | Attending: Gastroenterology | Admitting: Gastroenterology

## 2015-09-16 DIAGNOSIS — R1011 Right upper quadrant pain: Secondary | ICD-10-CM | POA: Diagnosis not present

## 2015-09-16 MED ORDER — TECHNETIUM TC 99M SULFUR COLLOID
2.1000 | Freq: Once | INTRAVENOUS | Status: AC | PRN
  Administered 2015-09-16: 2.1 via ORAL

## 2015-12-28 ENCOUNTER — Emergency Department (HOSPITAL_COMMUNITY)
Admission: EM | Admit: 2015-12-28 | Discharge: 2015-12-28 | Disposition: A | Discharge status: Home / Self Care | Payer: Medicare Other | Attending: Emergency Medicine | Admitting: Emergency Medicine

## 2015-12-28 ENCOUNTER — Encounter (HOSPITAL_COMMUNITY): Payer: Self-pay | Admitting: Emergency Medicine

## 2015-12-28 DIAGNOSIS — Y9389 Activity, other specified: Secondary | ICD-10-CM | POA: Diagnosis not present

## 2015-12-28 DIAGNOSIS — Z79899 Other long term (current) drug therapy: Secondary | ICD-10-CM | POA: Diagnosis not present

## 2015-12-28 DIAGNOSIS — M1990 Unspecified osteoarthritis, unspecified site: Secondary | ICD-10-CM | POA: Insufficient documentation

## 2015-12-28 DIAGNOSIS — R21 Rash and other nonspecific skin eruption: Secondary | ICD-10-CM | POA: Diagnosis present

## 2015-12-28 DIAGNOSIS — I739 Peripheral vascular disease, unspecified: Secondary | ICD-10-CM

## 2015-12-28 DIAGNOSIS — E785 Hyperlipidemia, unspecified: Secondary | ICD-10-CM | POA: Diagnosis not present

## 2015-12-28 DIAGNOSIS — F17210 Nicotine dependence, cigarettes, uncomplicated: Secondary | ICD-10-CM | POA: Insufficient documentation

## 2015-12-28 DIAGNOSIS — X58XXXA Exposure to other specified factors, initial encounter: Secondary | ICD-10-CM | POA: Diagnosis not present

## 2015-12-28 DIAGNOSIS — Y998 Other external cause status: Secondary | ICD-10-CM | POA: Insufficient documentation

## 2015-12-28 DIAGNOSIS — T7840XA Allergy, unspecified, initial encounter: Secondary | ICD-10-CM

## 2015-12-28 DIAGNOSIS — F419 Anxiety disorder, unspecified: Secondary | ICD-10-CM | POA: Diagnosis not present

## 2015-12-28 DIAGNOSIS — Y9289 Other specified places as the place of occurrence of the external cause: Secondary | ICD-10-CM | POA: Insufficient documentation

## 2015-12-28 DIAGNOSIS — I10 Essential (primary) hypertension: Secondary | ICD-10-CM | POA: Diagnosis not present

## 2015-12-28 DIAGNOSIS — Z7952 Long term (current) use of systemic steroids: Secondary | ICD-10-CM | POA: Diagnosis not present

## 2015-12-28 LAB — CBC WITH DIFFERENTIAL/PLATELET
Basophils Absolute: 0 10*3/uL (ref 0.0–0.1)
Basophils Relative: 0 %
Eosinophils Absolute: 0.7 10*3/uL (ref 0.0–0.7)
Eosinophils Relative: 9 %
HCT: 31.7 % — ABNORMAL LOW (ref 36.0–46.0)
Hemoglobin: 10.4 g/dL — ABNORMAL LOW (ref 12.0–15.0)
Lymphocytes Relative: 27 %
Lymphs Abs: 2 10*3/uL (ref 0.7–4.0)
MCH: 27.8 pg (ref 26.0–34.0)
MCHC: 32.8 g/dL (ref 30.0–36.0)
MCV: 84.8 fL (ref 78.0–100.0)
Monocytes Absolute: 0.4 10*3/uL (ref 0.1–1.0)
Monocytes Relative: 5 %
Neutro Abs: 4.4 10*3/uL (ref 1.7–7.7)
Neutrophils Relative %: 59 %
Platelets: 400 10*3/uL (ref 150–400)
RBC: 3.74 MIL/uL — ABNORMAL LOW (ref 3.87–5.11)
RDW: 16.4 % — ABNORMAL HIGH (ref 11.5–15.5)
WBC: 7.4 10*3/uL (ref 4.0–10.5)

## 2015-12-28 LAB — I-STAT CHEM 8, ED
BUN: 24 mg/dL — ABNORMAL HIGH (ref 6–20)
Calcium, Ion: 1.19 mmol/L (ref 1.13–1.30)
Chloride: 107 mmol/L (ref 101–111)
Creatinine, Ser: 1.2 mg/dL — ABNORMAL HIGH (ref 0.44–1.00)
Glucose, Bld: 155 mg/dL — ABNORMAL HIGH (ref 65–99)
HCT: 36 % (ref 36.0–46.0)
Hemoglobin: 12.2 g/dL (ref 12.0–15.0)
Potassium: 5.6 mmol/L — ABNORMAL HIGH (ref 3.5–5.1)
Sodium: 139 mmol/L (ref 135–145)
TCO2: 23 mmol/L (ref 0–100)

## 2015-12-28 MED ORDER — PREDNISONE 10 MG PO TABS: 60.0000 mg | ORAL_TABLET | Freq: Every day | ORAL | Status: DC

## 2015-12-28 MED ORDER — PREDNISONE 20 MG PO TABS
60.0000 mg | ORAL_TABLET | Freq: Once | ORAL | Status: AC
  Administered 2015-12-28: 60 mg via ORAL
  Filled 2015-12-28: qty 3

## 2015-12-28 MED ORDER — DIPHENHYDRAMINE HCL 25 MG PO CAPS
25.0000 mg | ORAL_CAPSULE | Freq: Once | ORAL | Status: AC
  Administered 2015-12-28: 25 mg via ORAL
  Filled 2015-12-28: qty 1

## 2015-12-28 MED ORDER — DIPHENHYDRAMINE HCL 25 MG PO CAPS: 25.0000 mg | ORAL_CAPSULE | Freq: Four times a day (QID) | ORAL | Status: DC | PRN

## 2015-12-28 NOTE — ED Notes (Signed)
MD at bedside. 

## 2015-12-28 NOTE — ED Provider Notes (Signed)
CSN: [id number]     Arrival date & time 12/28/15  1413 History   First MD Initiated Contact with Patient 12/28/15 1617     Chief Complaint  Patient presents with  . Rash  . Toe Pain     (Consider location/radiation/quality/duration/timing/severity/associated sxs/prior Treatment) HPI Comments: Pt comes in with cc of generalized rash and toe pain. Pt has hx HTN, HL, PVD. Reports that she had a recent L spine surgery 3 months ago, and soon after she has noticed that her L toe remains cold and it bends (flexes) without control, leading her to fall. She also complains of rash. Her rash is generalized, and spreading diffusely for the past 2 weeks. Rash started in the back and has spread elsewhere. Pt has no hx of allergic skin reaction, skin disease. No new meds before the onset of the rash.   ROS 10 Systems reviewed and are negative for acute change except as noted in the HPI.     The history is provided by the patient.    Past Medical History  Diagnosis Date  . Hyperlipidemia   . Hypertension   . Leg pain   . Joint pain   . Headache(784.0)   . Anxiety   . Arthritis   . Peripheral vascular disease [facility])    Past Surgical History  Procedure Laterality Date  . Total hip arthroplasty      right  . 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 Joint replacement Right 2012    Hip  . Abdominal angiogram N/A 10/29/2011    Procedure: ABDOMINAL ANGIOGRAM;  Surgeon: Elam Dutch, MD;  Location: [facility] CATH LAB;  Service: Cardiovascular;  Laterality: N/A;  . Back surgery     Family History  Problem Relation Age of Onset  . Heart attack Mother   . Heart disease Mother   . Hyperlipidemia Mother   . Hypertension Mother   . Heart attack Father   . Heart disease Father     Before age 67  . Hyperlipidemia Father   . Hypertension Father   . Cancer Sister   . Aneurysm Brother   . Cancer Brother    Social History  Substance Use Topics  . Smoking status: Current Every Day Smoker --  0.50 packs/day    Types: Cigarettes    Last Attempt to Quit: 01/27/2014  . Smokeless tobacco: Never Used  . Alcohol Use: No   OB History    No data available     Review of Systems    Allergies  Review of patient's allergies indicates no known allergies.  Home Medications   Prior to Admission medications   Medication Sig Start Date End Date Taking? Authorizing Provider  Amlodipine-Valsartan-HCTZ 10-320-25 MG TABS Take 1 tablet by mouth daily.    Historical Provider, MD  aspirin-acetaminophen-caffeine (EXCEDRIN MIGRAINE) [phone] MG per tablet Take 1 tablet by mouth every 6 (six) hours as needed for headache.    Historical Provider, MD  citalopram (CELEXA) 40 MG tablet Take 1 tablet (40 mg total) by mouth daily. 08/30/15   Delfin Gant, NP  diphenhydrAMINE (BENADRYL) 25 mg capsule Take 1 capsule (25 mg total) by mouth every 6 (six) hours as needed for itching. 12/28/15   Varney Biles, MD  ferrous sulfate 325 (65 FE) MG tablet Take 325 mg by mouth daily with breakfast.    Historical Provider, MD  HYDROcodone-acetaminophen (NORCO/VICODIN) 5-325 MG tablet Take 1-2 tablets by mouth every 6 (six) hours as needed for moderate pain or  severe pain. 08/30/15   Harvel Quale, MD  LORazepam (ATIVAN) 1 MG tablet Take 0.5 tablets (0.5 mg total) by mouth at bedtime. Take as prescribed. Do NOT take greater or more frequently then prescribed. Do NOT take with other sedating medications or ANY alcohol as this can result in death. This medication can impair coordination and reflexes, and cause drowsiness. Do NOT perform tasks in which this would place you in danger as it can make you a FALL RISK. 08/27/15   Voncille Lo, MD  Multiple Vitamin (MULTIVITAMIN WITH MINERALS) TABS tablet Take 1 tablet by mouth daily. One a Day Retail buyer, MD  OVER THE COUNTER MEDICATION Apply 1 patch topically at bedtime as needed (pain). Over the counter lidocaine patches    Historical Provider, MD   Oxycodone HCl 10 MG TABS Take 1 tablet (10 mg total) by mouth 2 (two) times daily as needed (pain). 08/15/15   Wandra Arthurs, MD  polyethylene glycol [facility] / Floria Raveling) packet Take 17 g by mouth daily. 08/30/15   Delfin Gant, NP  predniSONE (DELTASONE) 10 MG tablet Take 6 tablets (60 mg total) by mouth daily. 12/28/15   Varney Biles, MD  rosuvastatin (CRESTOR) 10 MG tablet Take 10 mg by mouth daily.     Historical Provider, MD  traZODone (DESYREL) 50 MG tablet Take 1 tablet (50 mg total) by mouth at bedtime as needed for sleep. 08/30/15   Delfin Gant, NP   BP 104/87 mmHg  Pulse 88  Temp(Src) 98 F (36.7 C) (Oral)  Resp 18  Ht 5\' 3"  (1.6 m)  Wt 97 lb 3 oz (44.084 kg)  BMI 17.22 kg/m2  SpO2 100% Physical Exam  Constitutional: She is oriented to person, place, and time. She appears well-developed.  HENT:  Head: Normocephalic and atraumatic.  Eyes: EOM are normal.  Neck: Normal range of motion. Neck supple.  Cardiovascular: Normal rate and intact distal pulses.   DP and PT verified with doppler. DP is palpable 1+  Pulmonary/Chest: Effort normal.  Abdominal: Bowel sounds are normal.  Musculoskeletal:  LLE - toe - pt unable to dorsiflex. Leg is cooler compared to the contralateral side. Pulse is 1+.  Neurological: She is alert and oriented to person, place, and time.  Skin: Skin is warm and dry. Rash noted.  maculo-papular rash diffusely. No crusting, no vesicles, no pustules. The rash is not staged at different stages. Rash over the torso, legs, upper extremities  Nursing note and vitals reviewed.   ED Course  Procedures (including critical care time) Labs Review Labs Reviewed  CBC WITH DIFFERENTIAL/PLATELET - Abnormal; Notable for the following:    RBC 3.74 (*)    Hemoglobin 10.4 (*)    HCT 31.7 (*)    RDW 16.4 (*)    All other components within normal limits  I-STAT CHEM 8, ED - Abnormal; Notable for the following:    Potassium 5.6 (*)    BUN 24 (*)     Creatinine, Ser 1.20 (*)    Glucose, Bld 155 (*)    All other components within normal limits    Imaging Review No results found. I have personally reviewed and evaluated these images and lab results as part of my medical decision-making.   EKG Interpretation None      MDM   Final diagnoses:  Hypersensitivity reaction, initial encounter  PVD (peripheral vascular disease) ([place])    Pt comes in with 2 separate complains. Main complain is  rash: Rash has slowly progressed, and it is itchy. No pain. No fevers. No hx of cancer. Pt is immunocompetent. She denies any hx of having allergic reaction. The rash appears to be hypersensitivity type reaction. Will start pred and benadryl. She has tried OTC hydrocortisone with no success.  She also c/o toe pain.  Exam was concerning because of the cooler toe. Further chart review shows that patient has a 75 y.o. female who is status post left to right fem-fem bypass graft in November 2011. She had an 2013 aortogram with bilateral runoff demonstrated a patent femoral-femoral bypass with no significant external iliac artery stenosis - so we will have to entertain vascular cause for the symptoms.  6:56 PM  Pt has a palpable pulse, verified with doppler. Previous aortogram reviwed. Symptoms are on going for a while, and so doubt any acute process. Will refer her back to vascular doctors.  Varney Biles, MD 12/28/15 1857

## 2015-12-28 NOTE — ED Notes (Signed)
Pt generalized rash x 2 weeks. Pt reports that she recently changed her detergent. Pt has history of back surgery and c/o pain to big toe on left foot. Pt reports the big toe curls under causing her to fall.

## 2015-12-28 NOTE — Discharge Instructions (Signed)
We are not sure what is causing your symptoms. Take the meds - they should help with the rash if it is allergic type reaction. Return top the ER if there is any fevers, pus drainage, or if the rash spreads into your mouth or skin is peeling off. For the toe and leg - continue seeing your vascular doctors and back surgeon.  The workup in the ER is not complete, and is limited to screening for life threatening and emergent conditions only, so please see a primary care doctor for further evaluation.   Peripheral Vascular Disease Peripheral vascular disease (PVD) is a disease of the blood vessels that are not part of your heart and brain. A simple term for PVD is poor circulation. In most cases, PVD narrows the blood vessels that carry blood from your heart to the rest of your body. This can result in a decreased supply of blood to your arms, legs, and internal organs, like your stomach or kidneys. However, it most often affects a person's lower legs and feet. There are two types of PVD.  Organic PVD. This is the more common type. It is caused by damage to the structure of blood vessels.  Functional PVD. This is caused by conditions that make blood vessels contract and tighten (spasm). Without treatment, PVD tends to get worse over time. PVD can also lead to acute ischemic limb. This is when an arm or limb suddenly has trouble getting enough blood. This is a medical emergency. CAUSES Each type of PVD has many different causes. The most common cause of PVD is buildup of a fatty material (plaque) inside of your arteries (atherosclerosis). Small amounts of plaque can break off from the walls of the blood vessels and become lodged in a smaller artery. This blocks blood flow and can cause acute ischemic limb. Other common causes of PVD include:  Blood clots that form inside of blood vessels.  Injuries to blood vessels.  Diseases that cause inflammation of blood vessels or cause blood vessel  spasms.  Health behaviors and health history that increase your risk of developing PVD. RISK FACTORS  You may have a greater risk of PVD if you:  Have a family history of PVD.  Have certain medical conditions, including:  High cholesterol.  Diabetes.  High blood pressure (hypertension).  Coronary heart disease.  Past problems with blood clots.  Past injury, such as burns or a broken bone. These may have damaged blood vessels in your limbs.  Buerger disease. This is caused by inflamed blood vessels in your hands and feet.  Some forms of arthritis.  Rare birth defects that affect the arteries in your legs.  Use tobacco.  Do not get enough exercise.  Are obese.  Are age 71 or older. SIGNS AND SYMPTOMS  PVD may cause many different symptoms. Your symptoms depend on what part of your body is not getting enough blood. Some common signs and symptoms include:  Cramps in your lower legs. This may be a symptom of poor leg circulation (claudication).  Pain and weakness in your legs while you are physically active that goes away when you rest (intermittent claudication).  Leg pain when at rest.  Leg numbness, tingling, or weakness.  Coldness in a leg or foot, especially when compared with the other leg.  Skin or hair changes. These can include:  Hair loss.  Shiny skin.  Pale or bluish skin.  Thick toenails.  Inability to get or maintain an erection (erectile dysfunction). People  with PVD are more prone to developing ulcers and sores on their toes, feet, or legs. These may take longer than normal to heal. DIAGNOSIS Your health care provider may diagnose PVD from your signs and symptoms. The health care provider will also do a physical exam. You may have tests to find out what is causing your PVD and determine its severity. Tests may include:  Blood pressure recordings from your arms and legs and measurements of the strength of your pulses (pulse volume  recordings).  Imaging studies using sound waves to take pictures of the blood flow through your blood vessels (Doppler ultrasound).  Injecting a dye into your blood vessels before having imaging studies using:  X-rays (angiogram or arteriogram).  Computer-generated X-rays (CT angiogram).  A powerful electromagnetic field and a computer (magnetic resonance angiogram or MRA). TREATMENT Treatment for PVD depends on the cause of your condition and the severity of your symptoms. It also depends on your age. Underlying causes need to be treated and controlled. These include long-lasting (chronic) conditions, such as diabetes, high cholesterol, and high blood pressure. You may need to first try making lifestyle changes and taking medicines. Surgery may be needed if these do not work. Lifestyle changes may include:  Quitting smoking.  Exercising regularly.  Following a low-fat, low-cholesterol diet. Medicines may include:  Blood thinners to prevent blood clots.  Medicines to improve blood flow.  Medicines to improve your blood cholesterol levels. Surgical procedures may include:  A procedure that uses an inflated balloon to open a blocked artery and improve blood flow (angioplasty).  A procedure to put in a tube (stent) to keep a blocked artery open (stent implant).  Surgery to reroute blood flow around a blocked artery (peripheral bypass surgery).  Surgery to remove dead tissue from an infected wound on the affected limb.  Amputation. This is surgical removal of the affected limb. This may be necessary in cases of acute ischemic limb that are not improved through medical or surgical treatments. HOME CARE INSTRUCTIONS  Take medicines only as directed by your health care provider.  Do not use any tobacco products, including cigarettes, chewing tobacco, or electronic cigarettes. If you need help quitting, ask your health care provider.  Lose weight if you are overweight, and  maintain a healthy weight as directed by your health care provider.  Eat a diet that is low in fat and cholesterol. If you need help, ask your health care provider.  Exercise regularly. Ask your health care provider to suggest some good activities for you.  Use compression stockings or other mechanical devices as directed by your health care provider.  Take good care of your feet.  Wear comfortable shoes that fit well.  Check your feet often for any cuts or sores. SEEK MEDICAL CARE IF:  You have cramps in your legs while walking.  You have leg pain when you are at rest.  You have coldness in a leg or foot.  Your skin changes.  You have erectile dysfunction.  You have cuts or sores on your feet that are not healing. SEEK IMMEDIATE MEDICAL CARE IF:  Your arm or leg turns cold and blue.  Your arms or legs become red, warm, swollen, painful, or numb.  You have chest pain or trouble breathing.  You suddenly have weakness in your face, arm, or leg.  You become very confused or lose the ability to speak.  You suddenly have a very bad headache or lose your vision.   This  information is not intended to replace advice given to you by your health care provider. Make sure you discuss any questions you have with your health care provider.   Document Released: 11/11/2004 Document Revised: 10/25/2014 Document Reviewed: 03/14/2014 Elsevier Interactive Patient Education 2016 [employer] Rash A rash is a change in the color or texture of the skin. There are many different types of rashes. You may have other problems that accompany your rash. CAUSES   Infections.  Allergic reactions. This can include allergies to pets or foods.  Certain medicines.  Exposure to certain chemicals, soaps, or cosmetics.  Heat.  Exposure to poisonous plants.  Tumors, both cancerous and noncancerous. SYMPTOMS   Redness.  Scaly skin.  Itchy skin.  Dry or cracked  skin.  Bumps.  Blisters.  Pain. DIAGNOSIS  Your caregiver may do a physical exam to determine what type of rash you have. A skin sample (biopsy) may be taken and examined under a microscope. TREATMENT  Treatment depends on the type of rash you have. Your caregiver may prescribe certain medicines. For serious conditions, you may need to see a skin doctor (dermatologist). HOME CARE INSTRUCTIONS   Avoid the substance that caused your rash.  Do not scratch your rash. This can cause infection.  You may take cool baths to help stop itching.  Only take over-the-counter or prescription medicines as directed by your caregiver.  Keep all follow-up appointments as directed by your caregiver. SEEK IMMEDIATE MEDICAL CARE IF:  You have increasing pain, swelling, or redness.  You have a fever.  You have new or severe symptoms.  You have body aches, diarrhea, or vomiting.  Your rash is not better after 3 days. MAKE SURE YOU:  Understand these instructions.  Will watch your condition.  Will get help right away if you are not doing well or get worse.   This information is not intended to replace advice given to you by your health care provider. Make sure you discuss any questions you have with your health care provider.   Document Released: 09/24/2002 Document Revised: 10/25/2014 Document Reviewed: 02/19/2015 Elsevier Interactive Patient Education [employer].

## 2016-01-12 ENCOUNTER — Ambulatory Visit (INDEPENDENT_AMBULATORY_CARE_PROVIDER_SITE_OTHER): Payer: Medicare Other | Admitting: Neurology

## 2016-01-12 ENCOUNTER — Encounter: Payer: Self-pay | Admitting: Neurology

## 2016-01-12 VITALS — BP 140/83 | HR 91 | Ht 63.0 in | Wt 97.0 lb

## 2016-01-12 DIAGNOSIS — M21372 Foot drop, left foot: Secondary | ICD-10-CM

## 2016-01-12 DIAGNOSIS — M4316 Spondylolisthesis, lumbar region: Secondary | ICD-10-CM | POA: Diagnosis not present

## 2016-01-12 DIAGNOSIS — R269 Unspecified abnormalities of gait and mobility: Secondary | ICD-10-CM | POA: Insufficient documentation
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
## 2016-02-02 ENCOUNTER — Ambulatory Visit (INDEPENDENT_AMBULATORY_CARE_PROVIDER_SITE_OTHER): Payer: Medicare Other | Admitting: Neurology

## 2016-02-02 DIAGNOSIS — M5416 Radiculopathy, lumbar region: Secondary | ICD-10-CM | POA: Diagnosis not present

## 2016-02-02 DIAGNOSIS — R269 Unspecified abnormalities of gait and mobility: Secondary | ICD-10-CM

## 2016-02-02 DIAGNOSIS — M4316 Spondylolisthesis, lumbar region: Secondary | ICD-10-CM | POA: Diagnosis not present

## 2016-02-02 DIAGNOSIS — M21372 Foot drop, left foot: Secondary | ICD-10-CM

## 2016-02-02 NOTE — Patient Instructions (Signed)
[facility]  Orthotics & Prosthetics Service  [address]  [phone]  
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
## 2016-02-02 NOTE — Procedures (Signed)
   NCS (NERVE CONDUCTION STUDY) WITH EMG (ELECTROMYOGRAPHY) REPORT   STUDY DATE: February 02 2016 [name] DOB: [birth year] MRN: [id number]    TECHNOLOGIST: Laretta Alstrom ELECTROMYOGRAPHER: Marcial Pacas M.D.  CLINICAL INFORMATION:  75 years old female, with history of right hip injury, multiple right hip replacement in the past, right leg is shorter, ambulate with a right shoe insert, presented with acute worsening of left-sided low back pain, radiating pain to left lower extremity, complete left foot drop in October 2016, status post lumbar decompression surgery in August 01 2015, with continued significant left ankle weakness, gait abnormality.  FINDINGS: NERVE CONDUCTION STUDY: Bilateral peroneal sensory responses were normal. Right peroneal to EDB and tibial motor responses were normal. Left peroneal to EDB and left tibial motor responses showed significantly decreased C map amplitude, with normal distal latency, conduction velocity. Right tibial H reflex was present. Left tibial H reflex was absent.  NEEDLE ELECTROMYOGRAPHY: Selective needle examinations were performed at bilateral lower extremity muscles, bilateral lumbar sacral paraspinal muscles.  Left tibialis anterior: Increased insertional activity, 3 plus spontaneous activity, decreased motor unit potential, there are only remote enlarged complex motor unit potential firing with significant decreased recruitment patterns.  Left tibialis posterior, medial gastrocnemius, peroneal longus: Increased insertional activity, 2-3 plus spontaneous activity, complex enlarged motor unit potential, with decreased recruitment patterns.  Left biceps femoris long head, biceps femoris short head: Increased insertional activities, frequent CRDs, complex motor unit potentials, with decreased recruitment patterns.  Left gluteus medius, normal insertion activity, no spontaneous activity, mixture of normal some enlarged motor  unit potential with mildly decreased recruitment patterns.  Left vastus lateralis: Normal insertion activity, no spontaneous activity, enlarged complex motor unit potential with decreased recruitment patterns.  Left Iliopsoas muscle, left adductor magnus: Normal insertion activity, no spontaneous activity, mild enlargement motor unit potential, with significantly decreased recruitment patterns.  Right vastus lateralis, tibialis anterior, medial gastrocnemius, biceps femoris long head: Normal insertion activity, no spontaneous activity, enlarged complex motor unit potential with decreased recruitment patterns.  There was well-healed midline scar, there was increased insertional activity along bilateral lumbar sacral paraspinal muscles at bilateral L3, L4, L5-S1. This could be related to her previous lumbar decompression surgery.   IMPRESSION:   This is a significantly abnormal study. There is electrodiagnostic evidence of active left lumbar sacral radiculopathy, mainly involving left L4, L5, S1 myotomes. There is also evidence of chronic right lumbar sacral radiculopathy, involving right L4, L5 myotomes.   INTERPRETING PHYSICIAN:   Marcial Pacas M.D. Ph.D. [facility] [address], [place], [place] [address] [phone]

## 2016-02-03 ENCOUNTER — Telehealth: Payer: Self-pay | Admitting: Neurology

## 2016-02-03 NOTE — Telephone Encounter (Signed)
Pt sts she went to medical supply store on [facility]. and will need RX for the AFO brace. She is requesting the RX be sent to her home address.

## 2016-02-03 NOTE — Telephone Encounter (Signed)
Dr. Krista Blue agreeable to rx for AFO brace for patient's foot drop.  The home address was verified verbally with the patient and the signed prescription was placed in the mail today.

## 2016-03-14 IMAGING — DX DG FOOT COMPLETE 3+V*R*
3 series · 3 of 3 positions shown · non-contrast
Comparison: None.

CLINICAL DATA: Slipped and fell down steps last night, right foot
pain and swelling

EXAM:
RIGHT FOOT COMPLETE - 3+ VIEW

[foot ap]
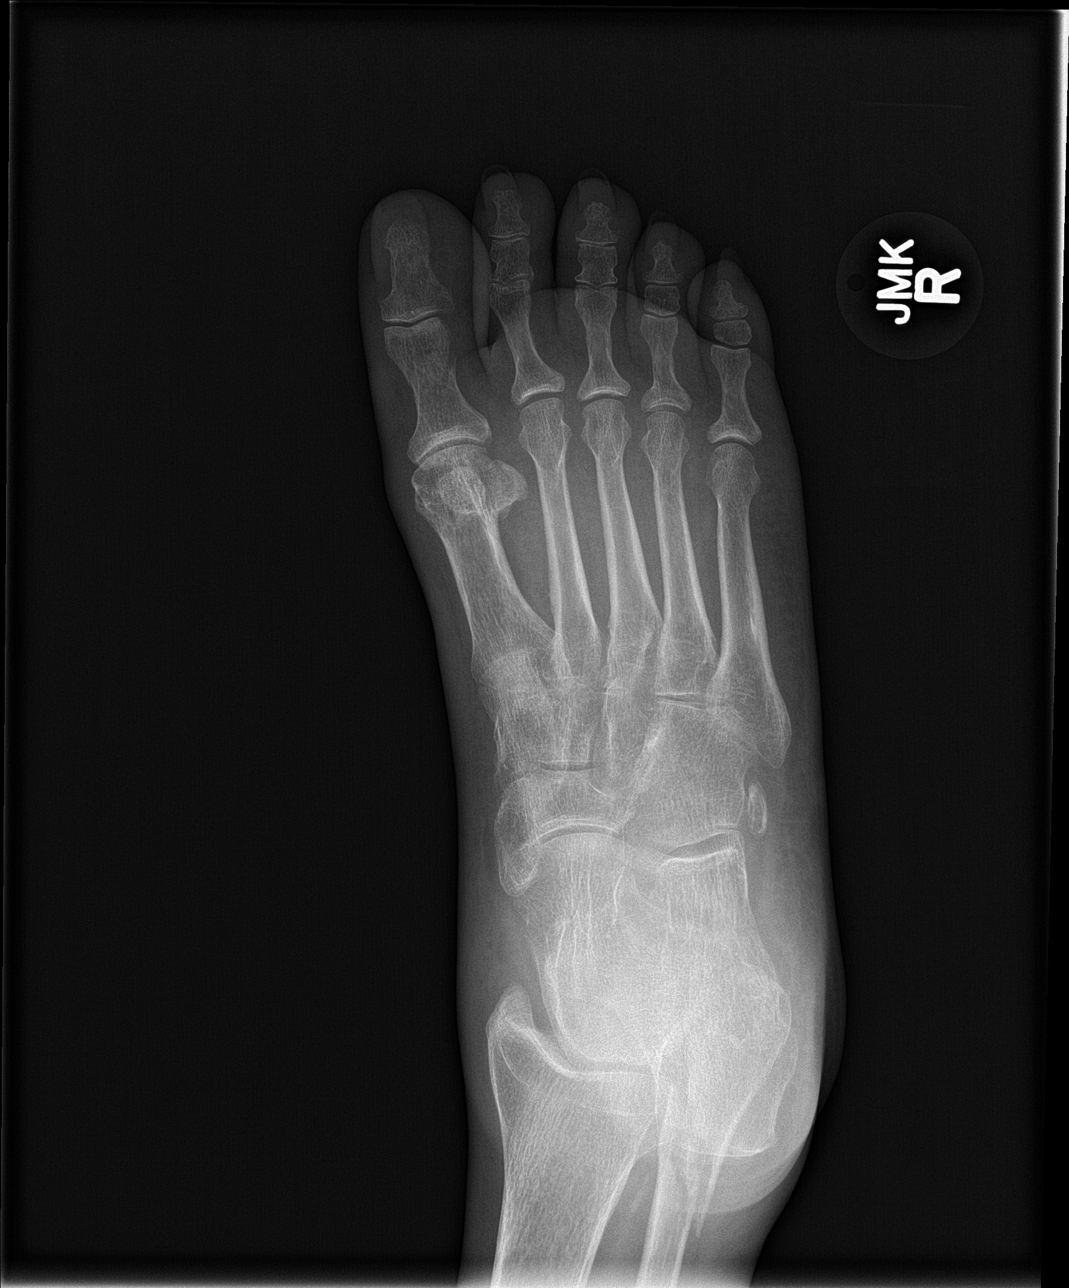

[foot obl]
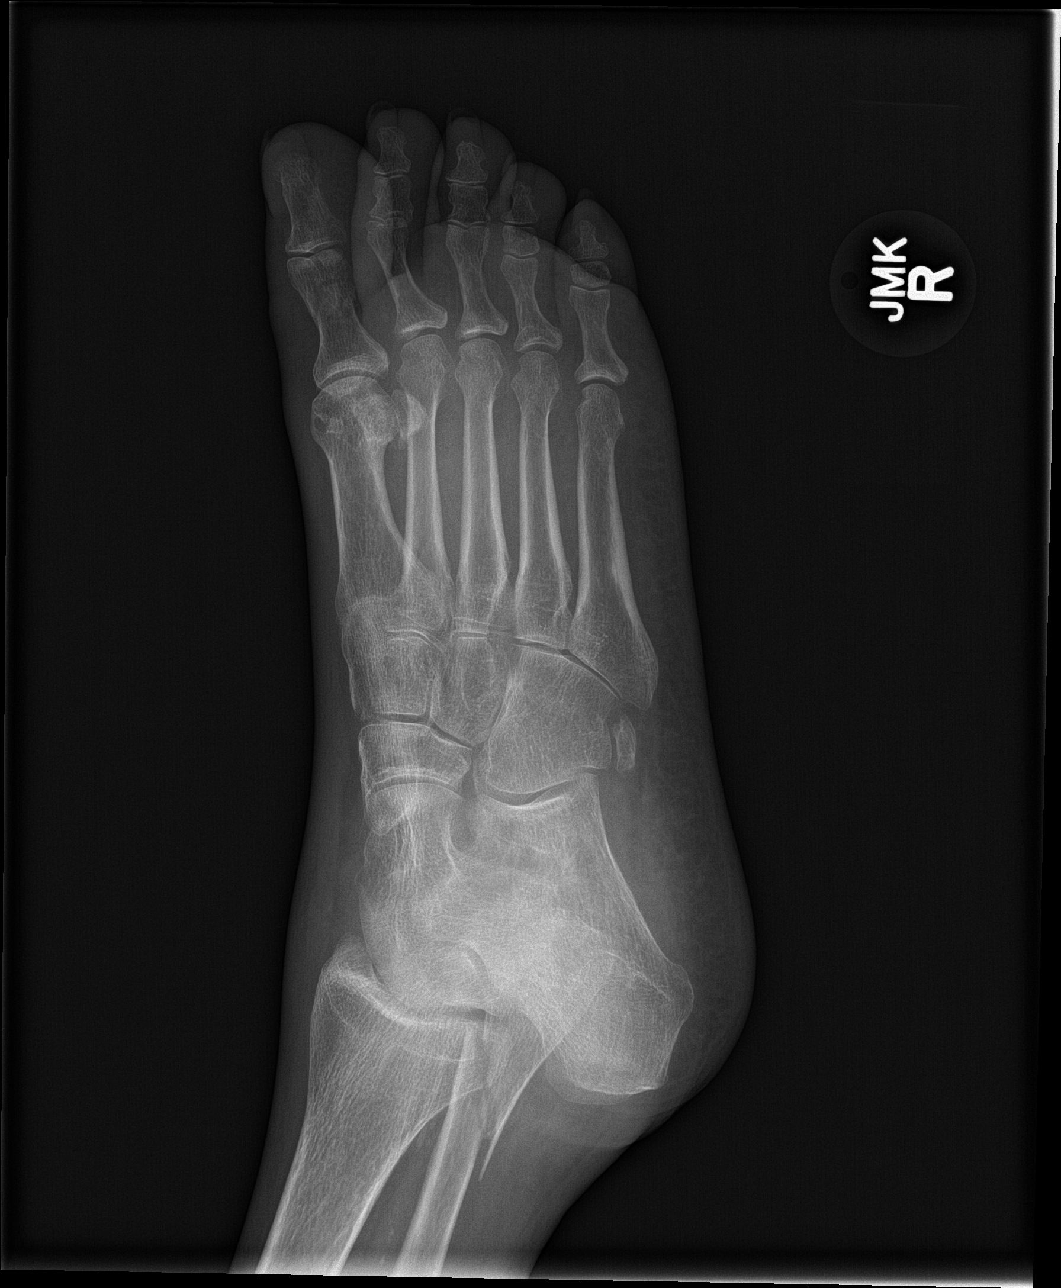

[foot lat]
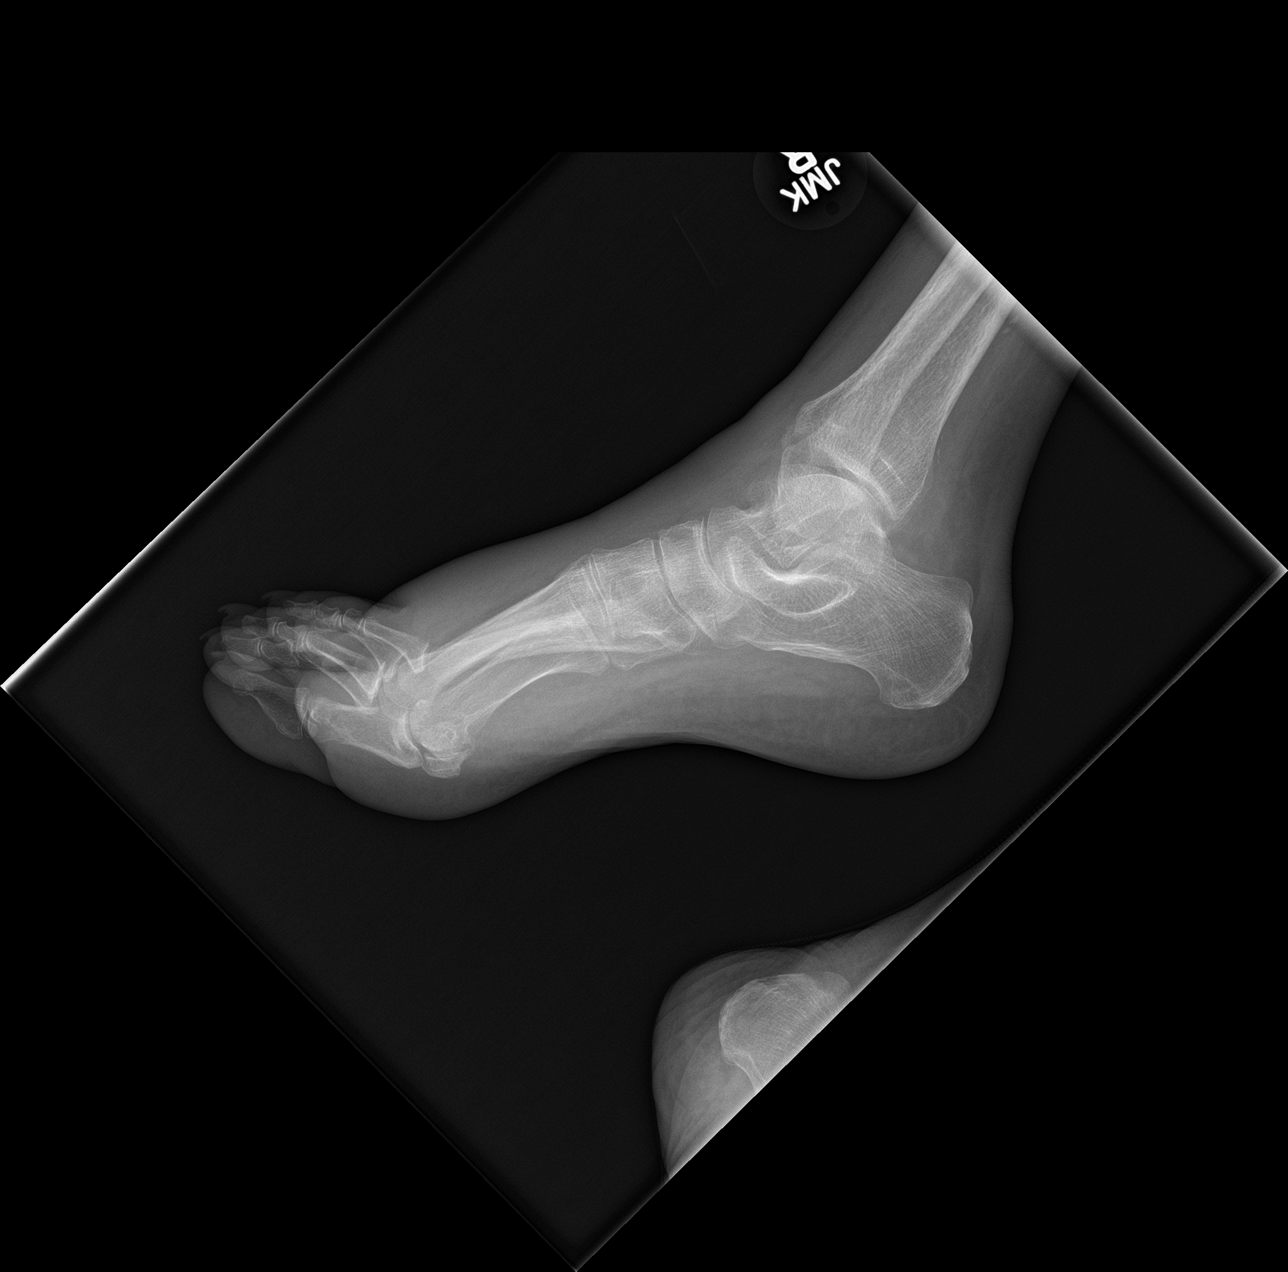

[3 of 3 positions shown; findings below may reference images not displayed]

FINDINGS: Three views of the right foot submitted. Study is limited by diffuse
osteopenia. There is minimal displaced fracture of proximal phalanx
great toe.
IMPRESSION: Limited study by diffuse osteopenia. Minimal displaced fracture of
proximal phalanx great toe.

## 2016-03-14 IMAGING — CR DG ANKLE PORT 2V*R*
2 series · 2 of 2 positions shown · non-contrast
Comparison: 02/04/2015

CLINICAL DATA: Right ankle fracture.  Status post reduction.

EXAM:
PORTABLE RIGHT ANKLE - 2 VIEW

[AP]
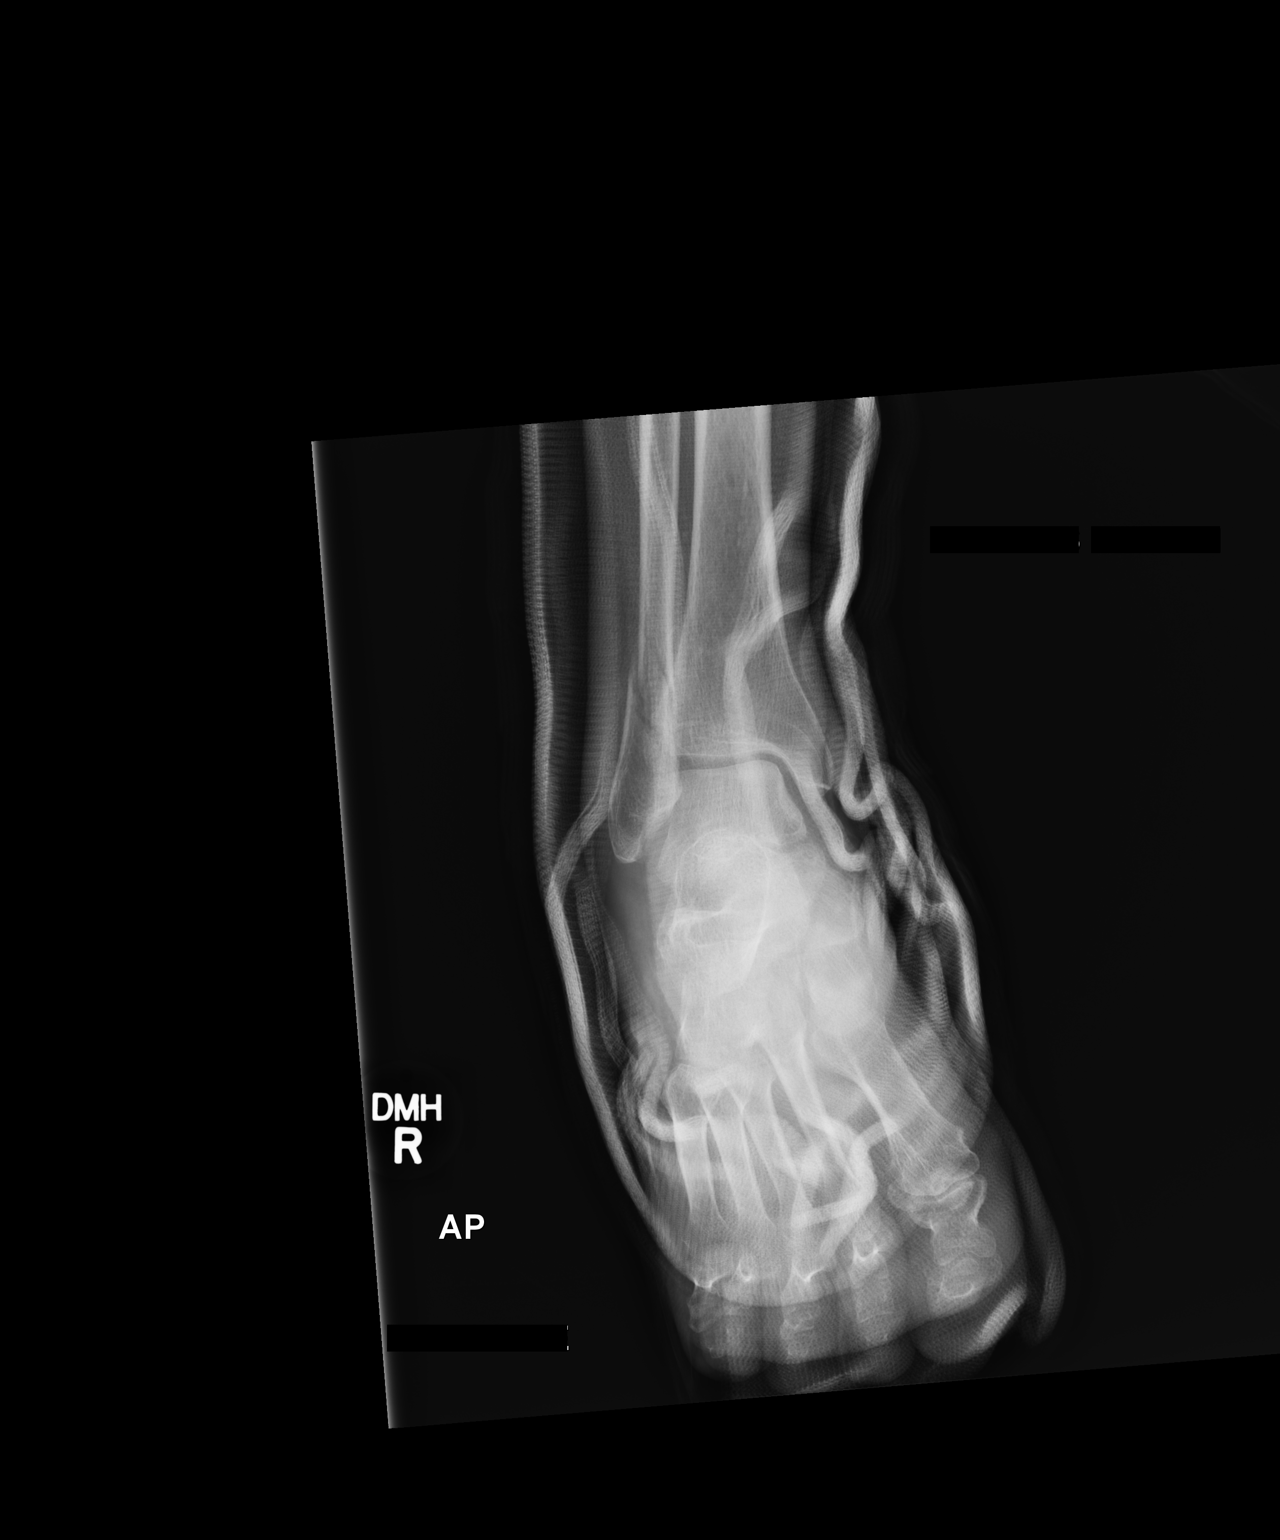

[lateral]
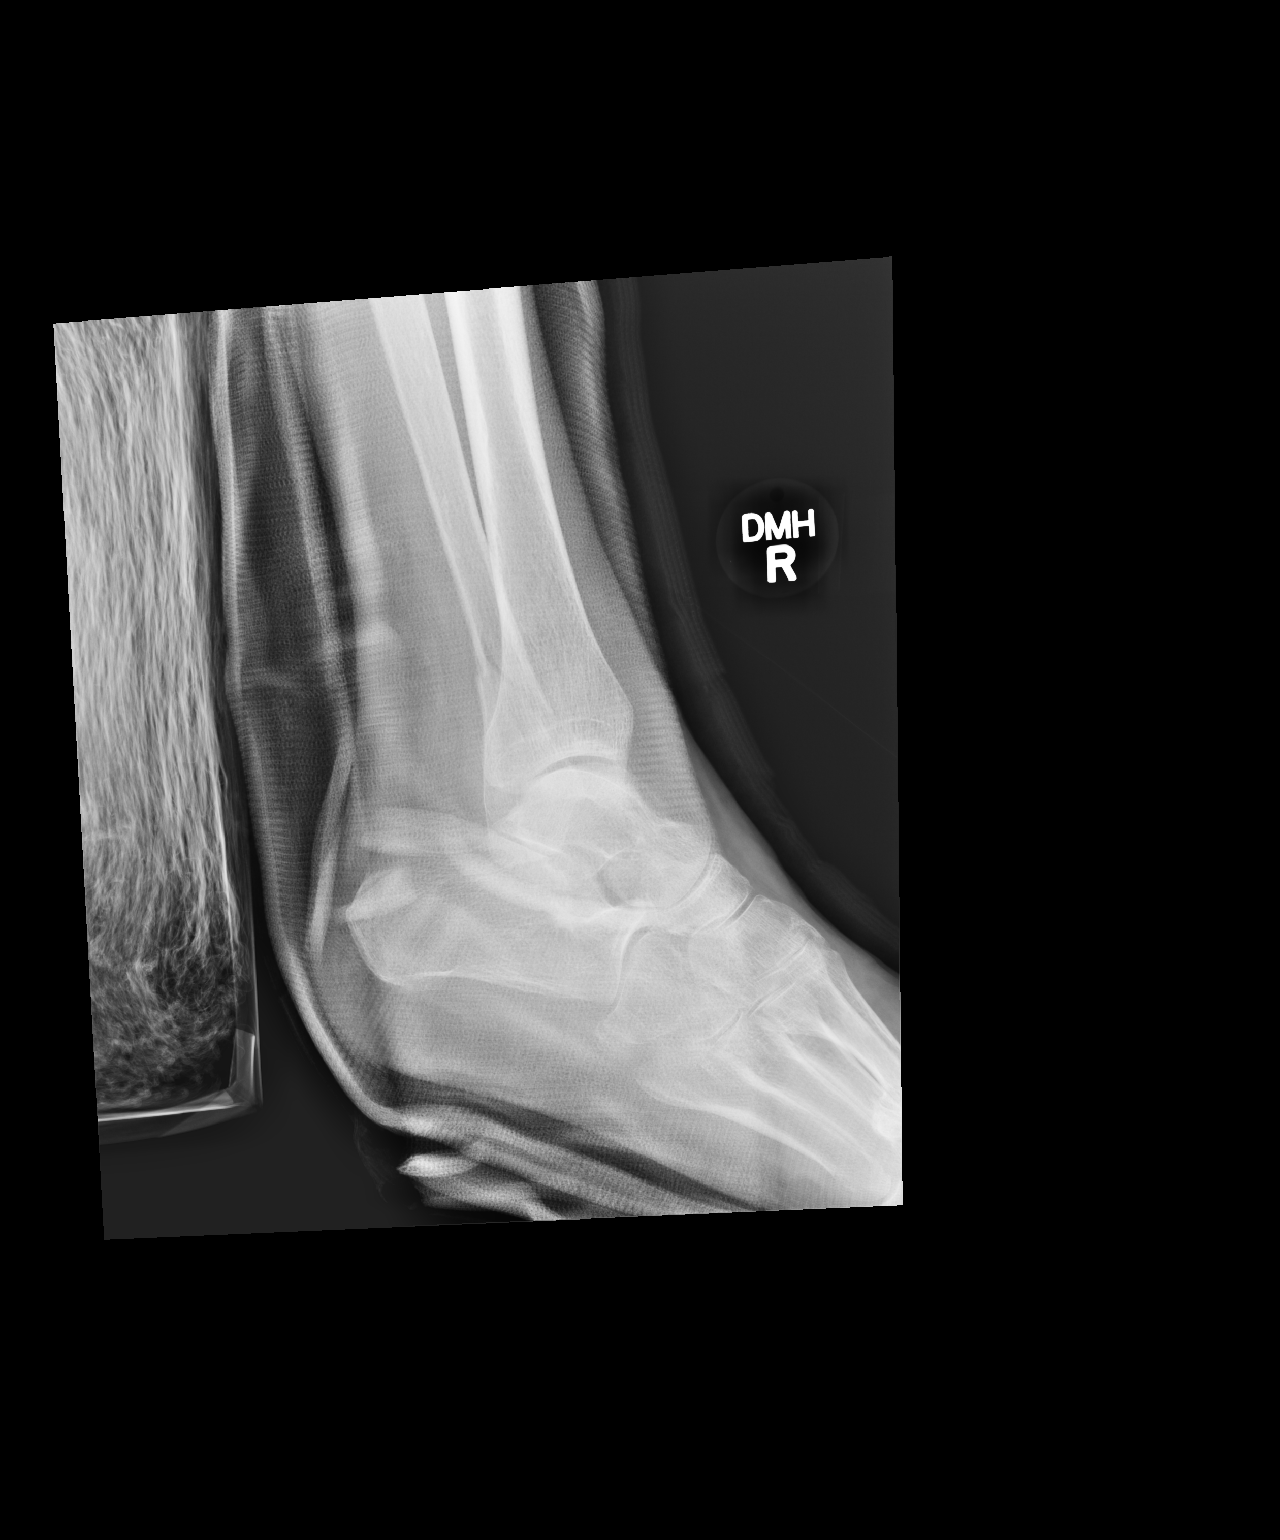

[2 of 2 positions shown; findings below may reference images not displayed]

FINDINGS: Splinting material overlies the ankle. There is an oblique fracture
of the lateral malleolus showing interval reduction. There is
persistent widening of the medial aspect of the tibiotalar joint
space. No new fractures are identified.
IMPRESSION: 1. Joint partially obscured by splinting material.
2. Interval reduction.
3. Persistent widening of the medial aspect of the mortise.

## 2016-03-14 IMAGING — DX DG KNEE COMPLETE 4+V*R*
4 series · 4 of 4 positions shown · non-contrast
Comparison: 09/01/2007

CLINICAL DATA: Fell down flight of stairs last night when her shoe
slipped, knocking her RIGHT knee and ankle on the way down, pain
throughout RIGHT knee, difficulty bending, unable to bear weight,
initial encounter

EXAM:
RIGHT KNEE - COMPLETE 4+ VIEW

[knee ap]
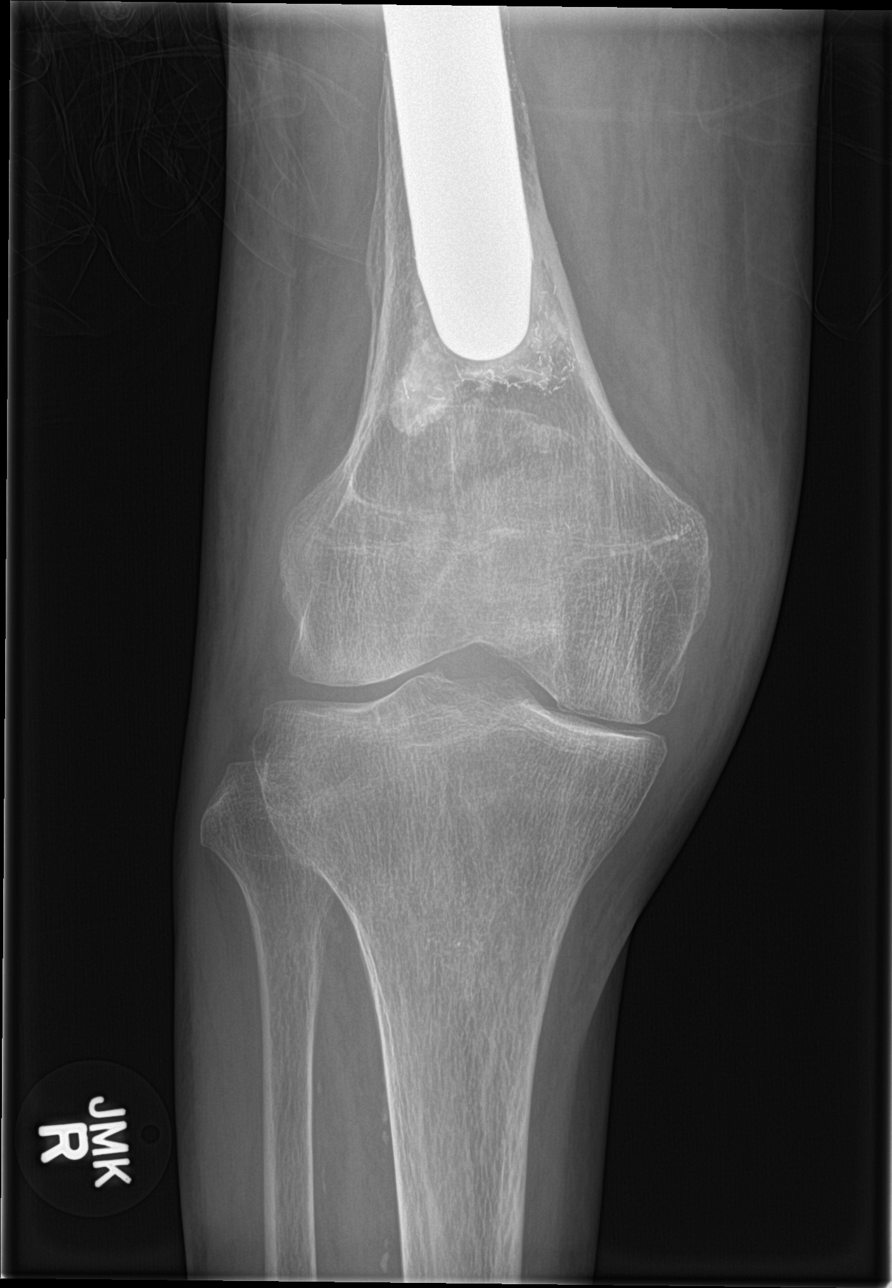

[knee obl (1 of 2)]
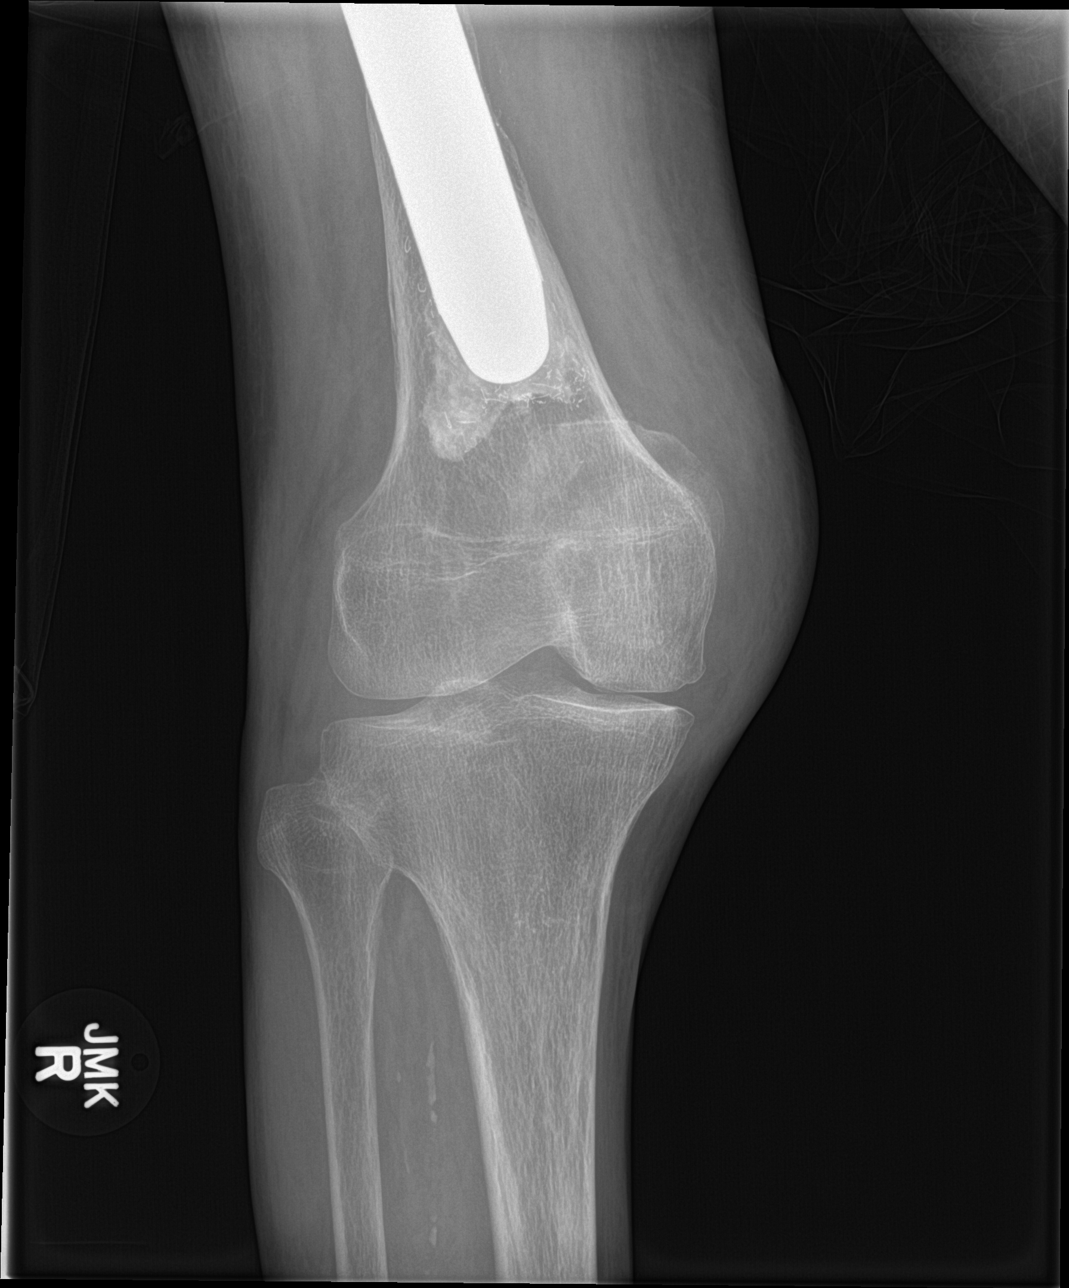

[knee obl (2 of 2)]
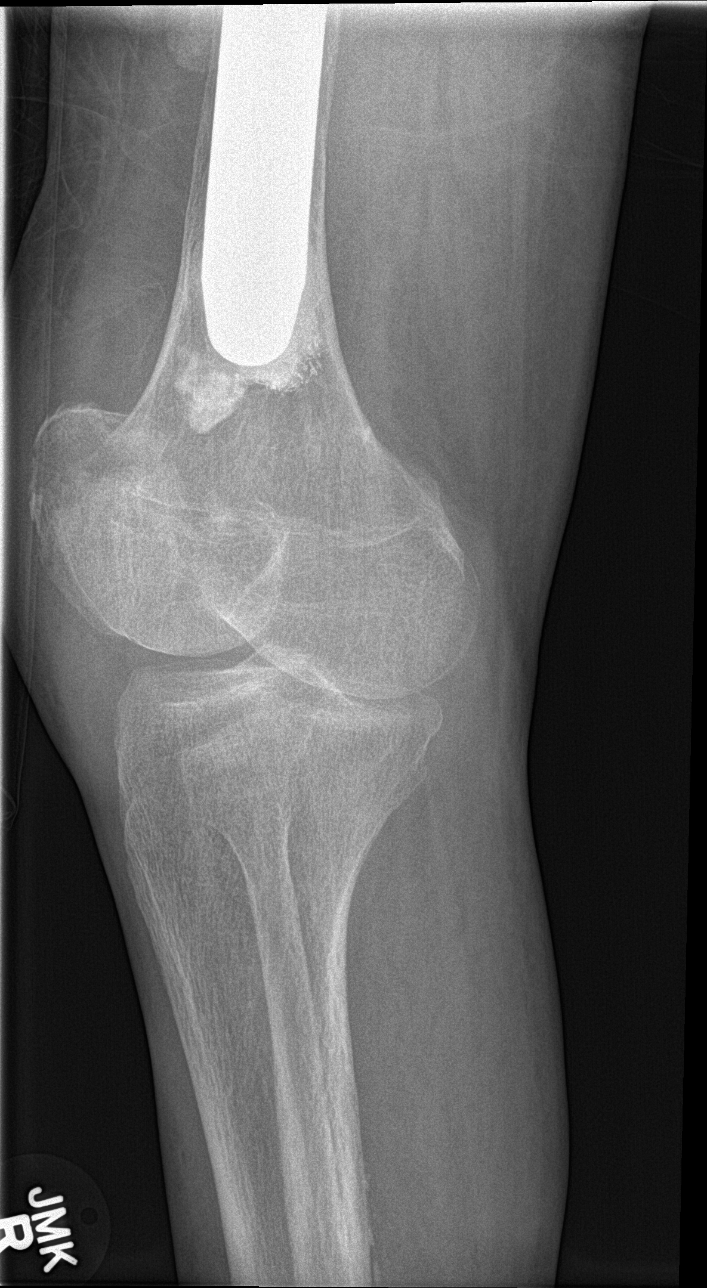

[knee lat]
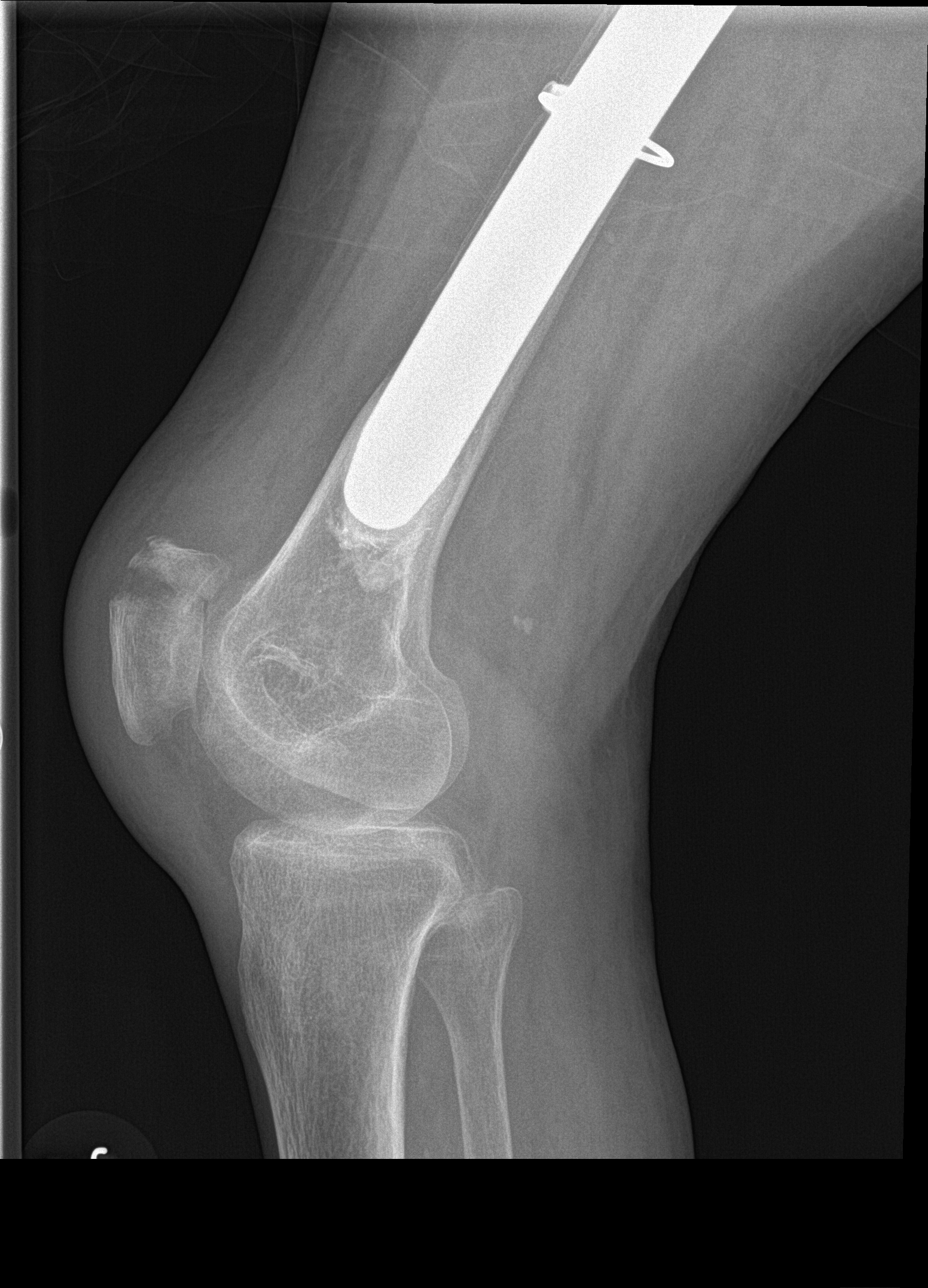

[4 of 4 positions shown; findings below may reference images not displayed]

FINDINGS: Osseous demineralization.

Large metallic prosthesis within mid to distal RIGHT femur.

Mature periosteal new bone along the lateral aspect of the distal
femur, unchanged.

Diffuse joint space narrowing at RIGHT knee.

Transverse fracture of proximal patella, mildly distracted.

Associated soft tissue swelling anteriorly and medially at RIGHT
knee.

No additional fracture, dislocation or bone destruction.
IMPRESSION: Mildly distracted transverse fracture of the proximal RIGHT patella.

Osseous demineralization with stable mid to distal RIGHT femoral
hardware.

## 2016-03-14 IMAGING — DX DG KNEE COMPLETE 4+V*L*
4 series · 4 of 4 positions shown · non-contrast
Comparison: 05/15/2012

CLINICAL DATA: Fell down stairs last night.  Knee pain

EXAM:
LEFT KNEE - COMPLETE 4+ VIEW

[knee ap]
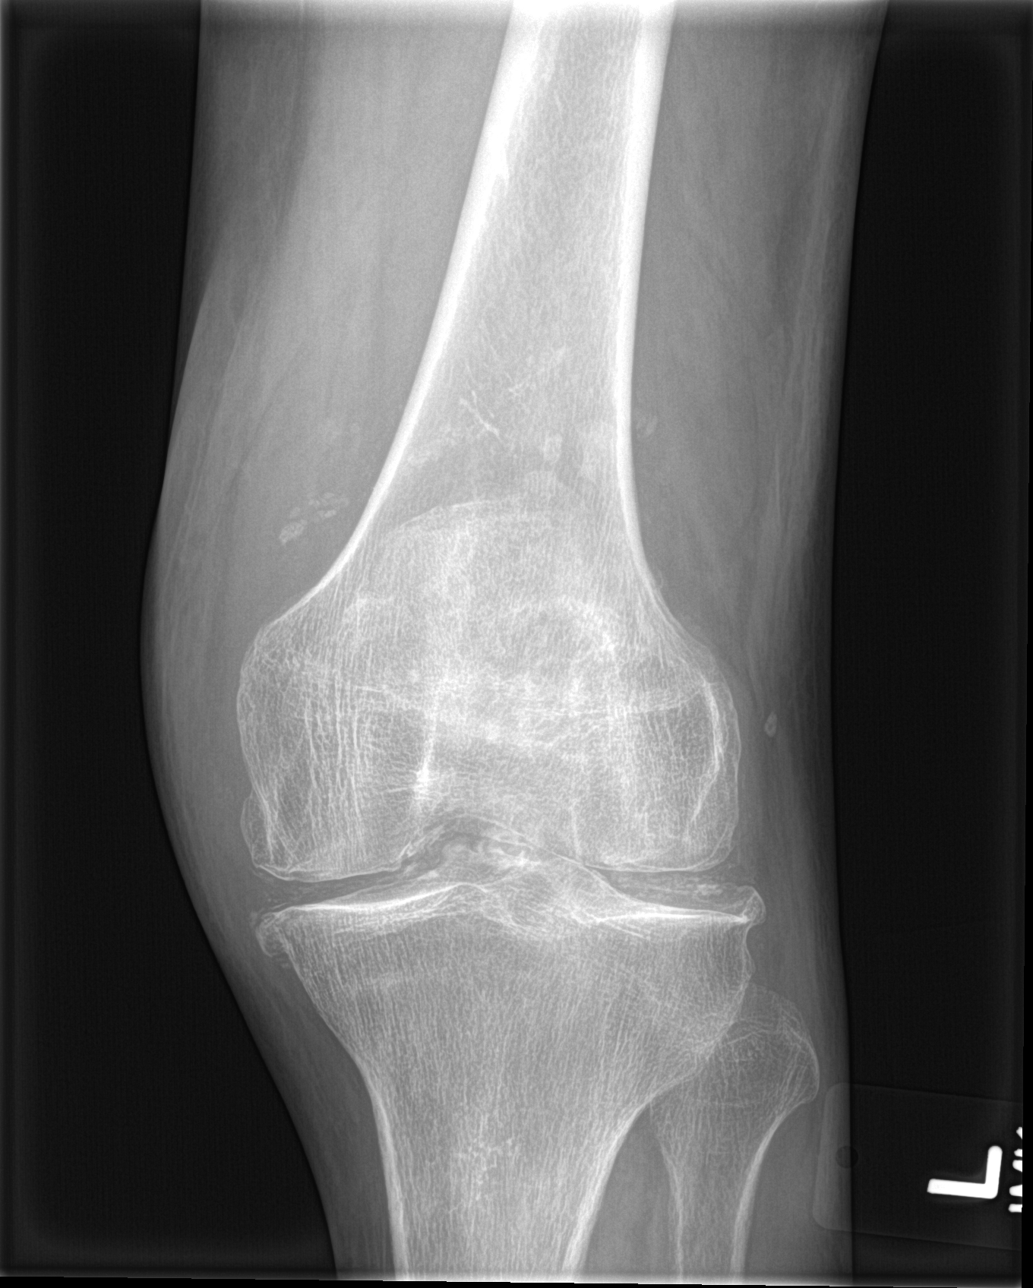

[knee obl (1 of 2)]
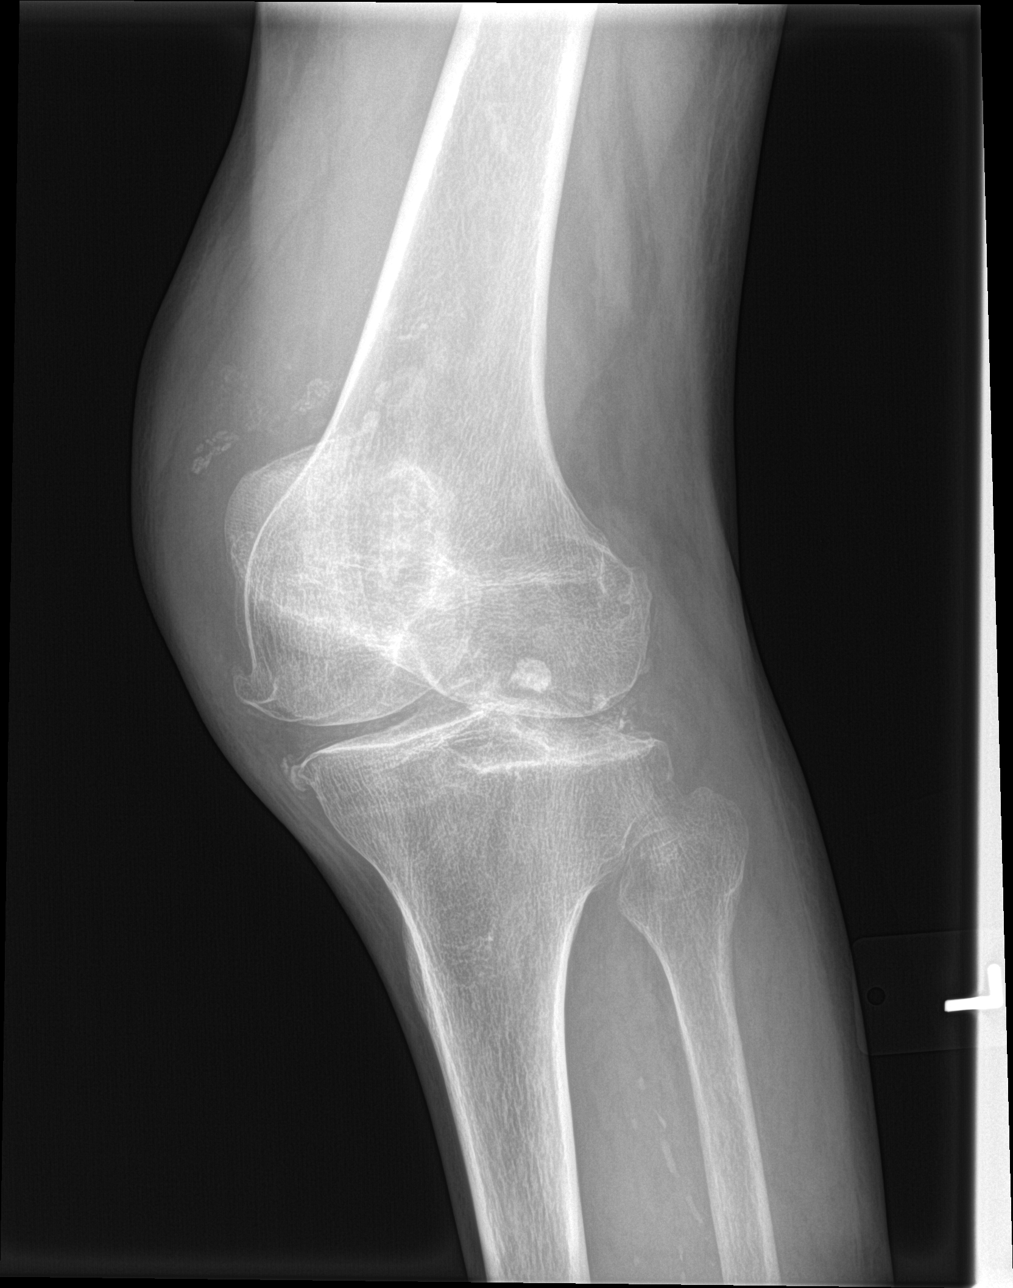

[knee obl (2 of 2)]
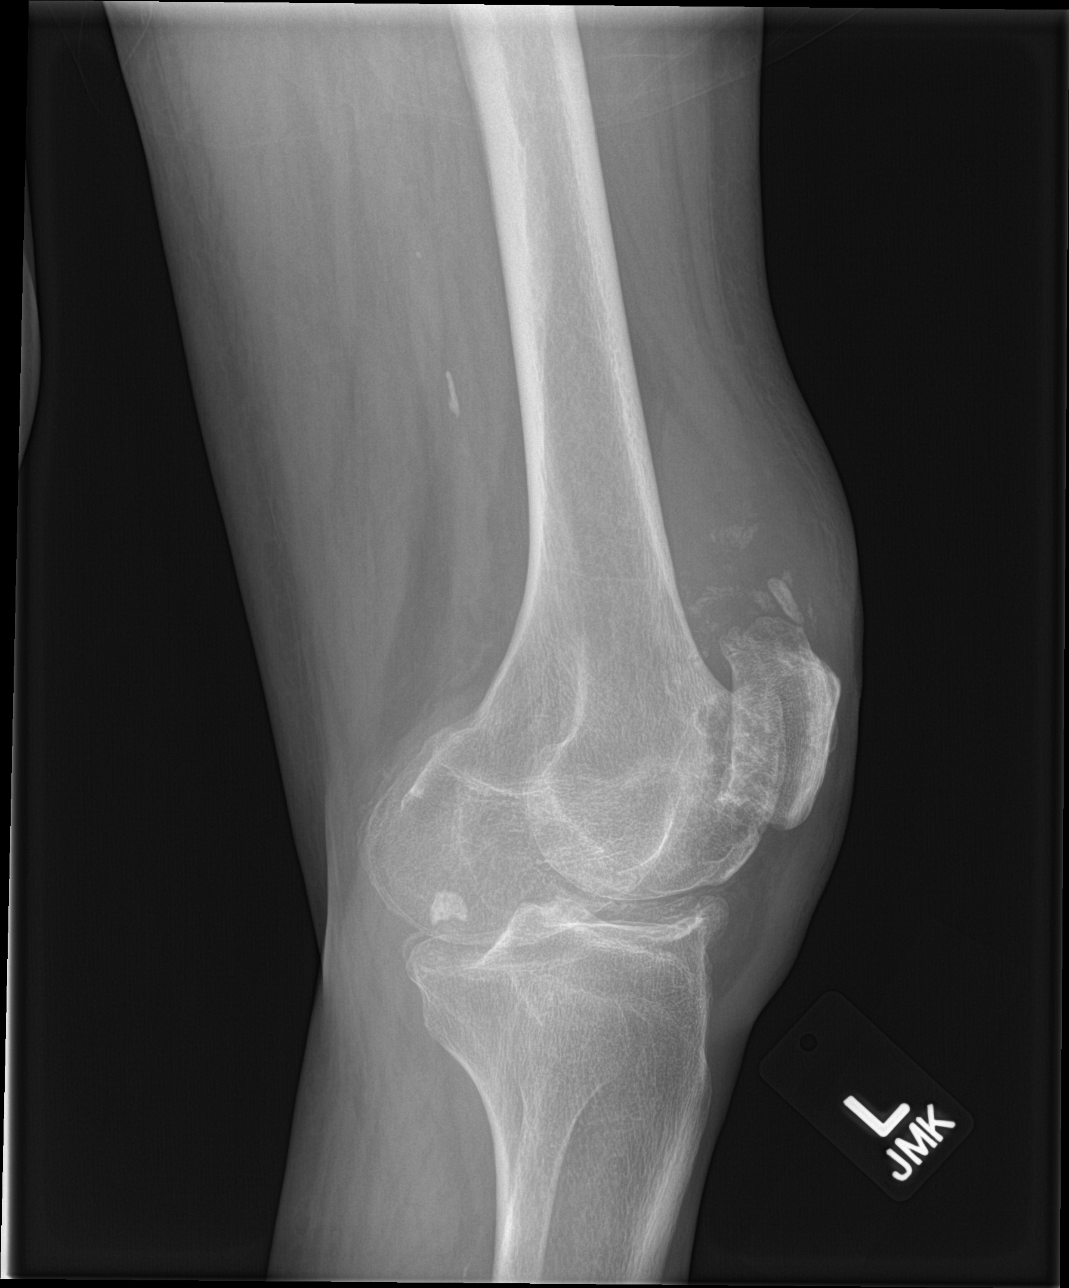

[knee lat]
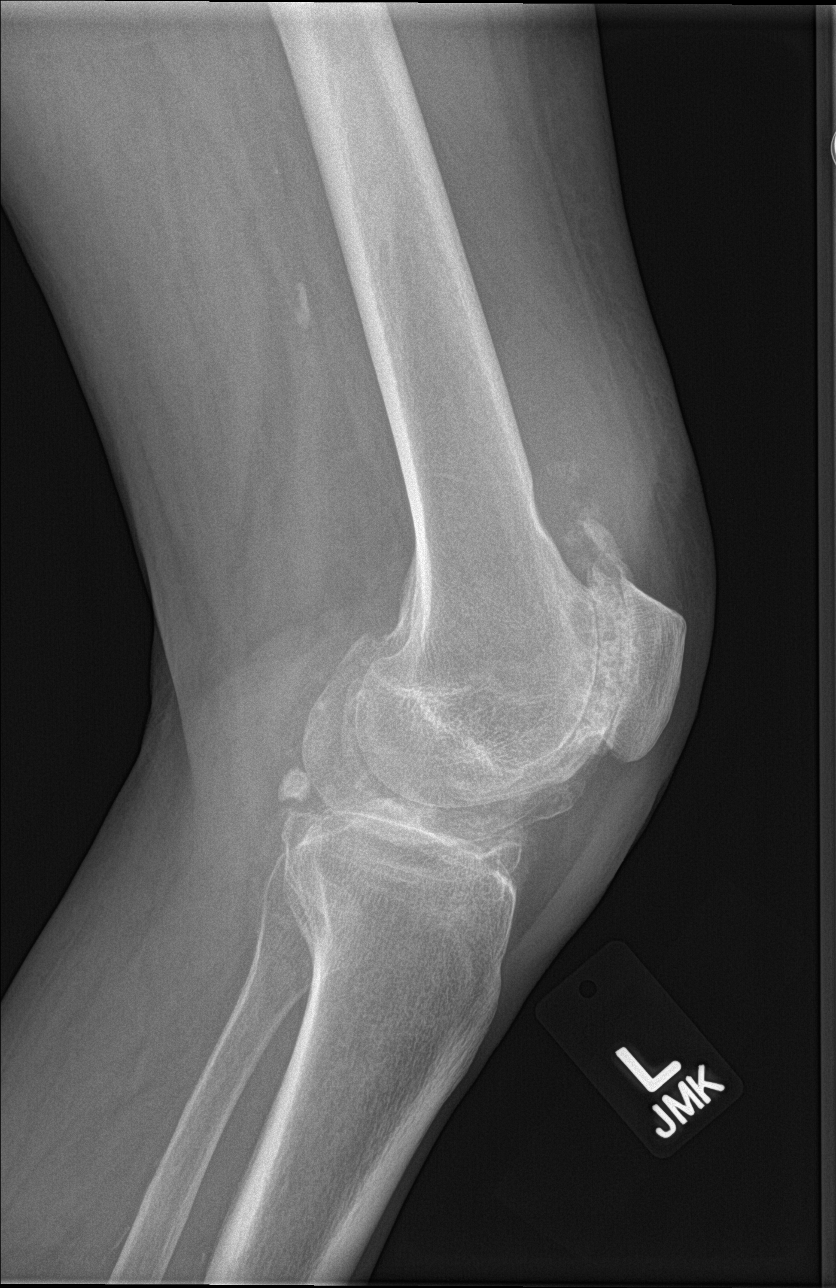

[4 of 4 positions shown; findings below may reference images not displayed]

FINDINGS: Advanced tricompartmental degenerative change with diffuse joint
space narrowing and spurring in all 3 compartments.
Chondrocalcinosis medial and lateral joint spaces.

Moderate joint effusion. Calcified loose bodies in the suprapatellar
bursa and posterior to the knee joint.

Negative for acute fracture
IMPRESSION: Advanced degenerative change.  Negative for fracture.

## 2016-03-14 IMAGING — DX DG ANKLE COMPLETE 3+V*R*
3 series · 3 of 3 positions shown · non-contrast
Comparison: None.

CLINICAL DATA: Slipped, fell down steps last night foot and ankle
pain

EXAM:
RIGHT ANKLE - COMPLETE 3+ VIEW

[ankle ap]
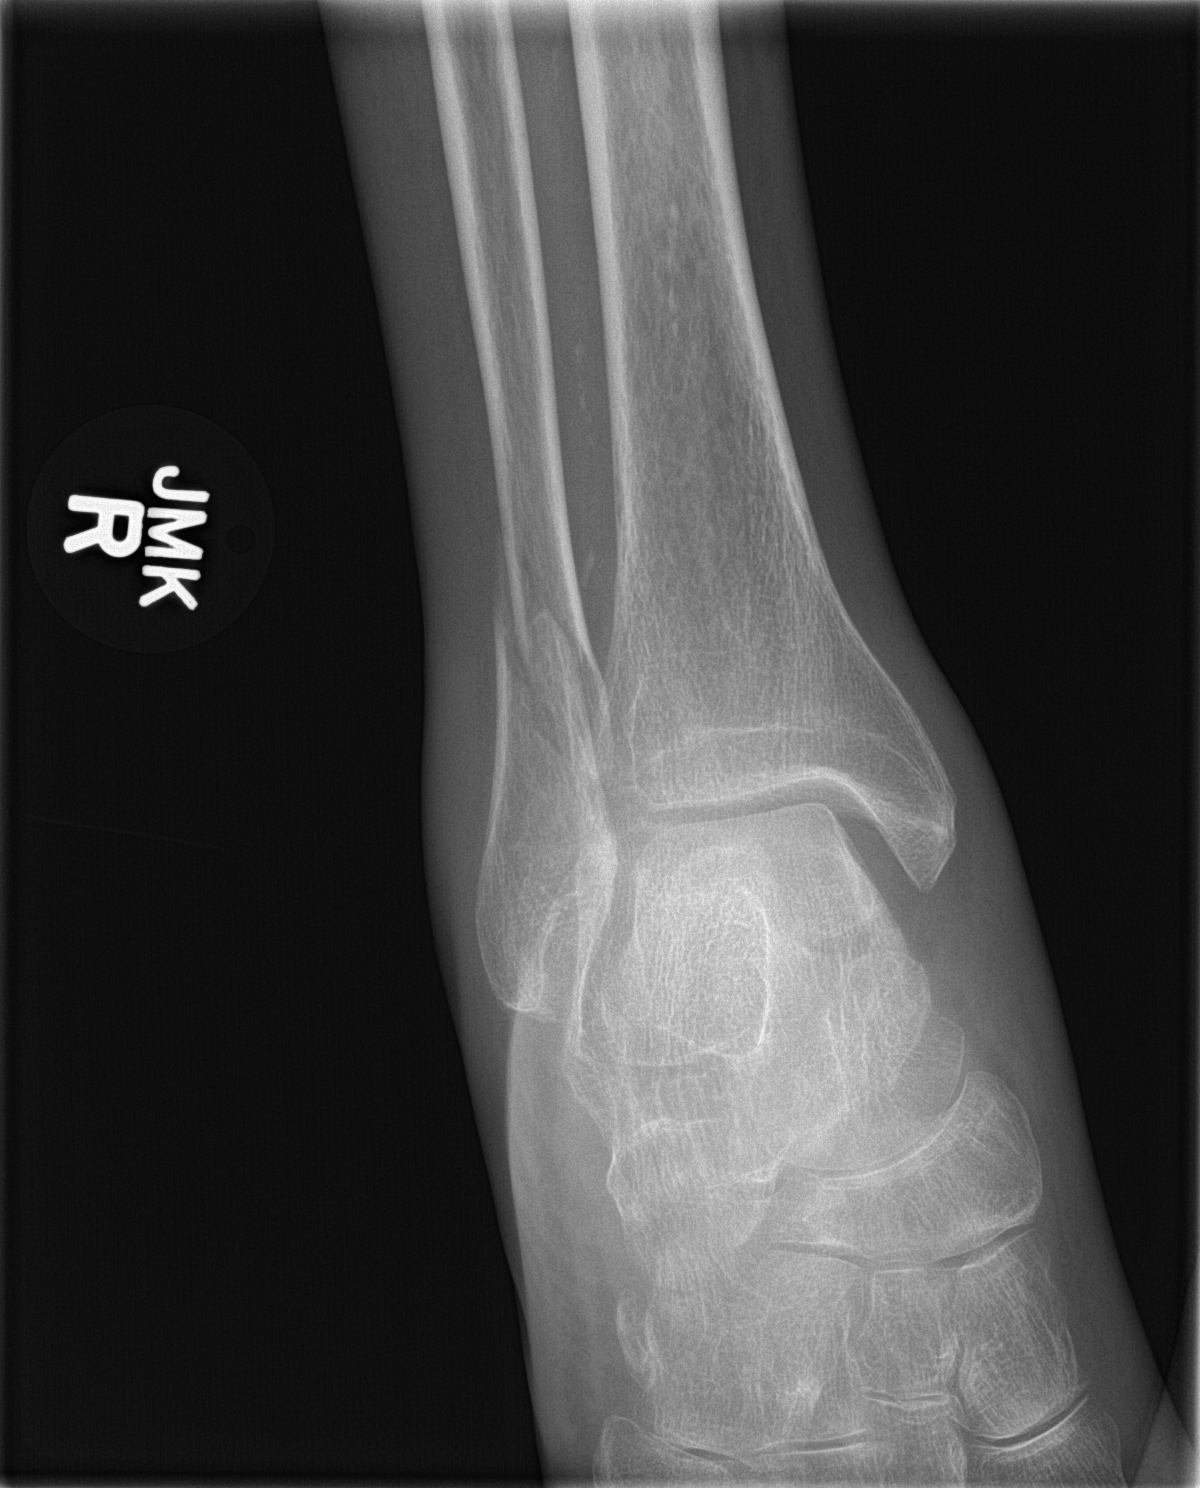

[ankle obl]
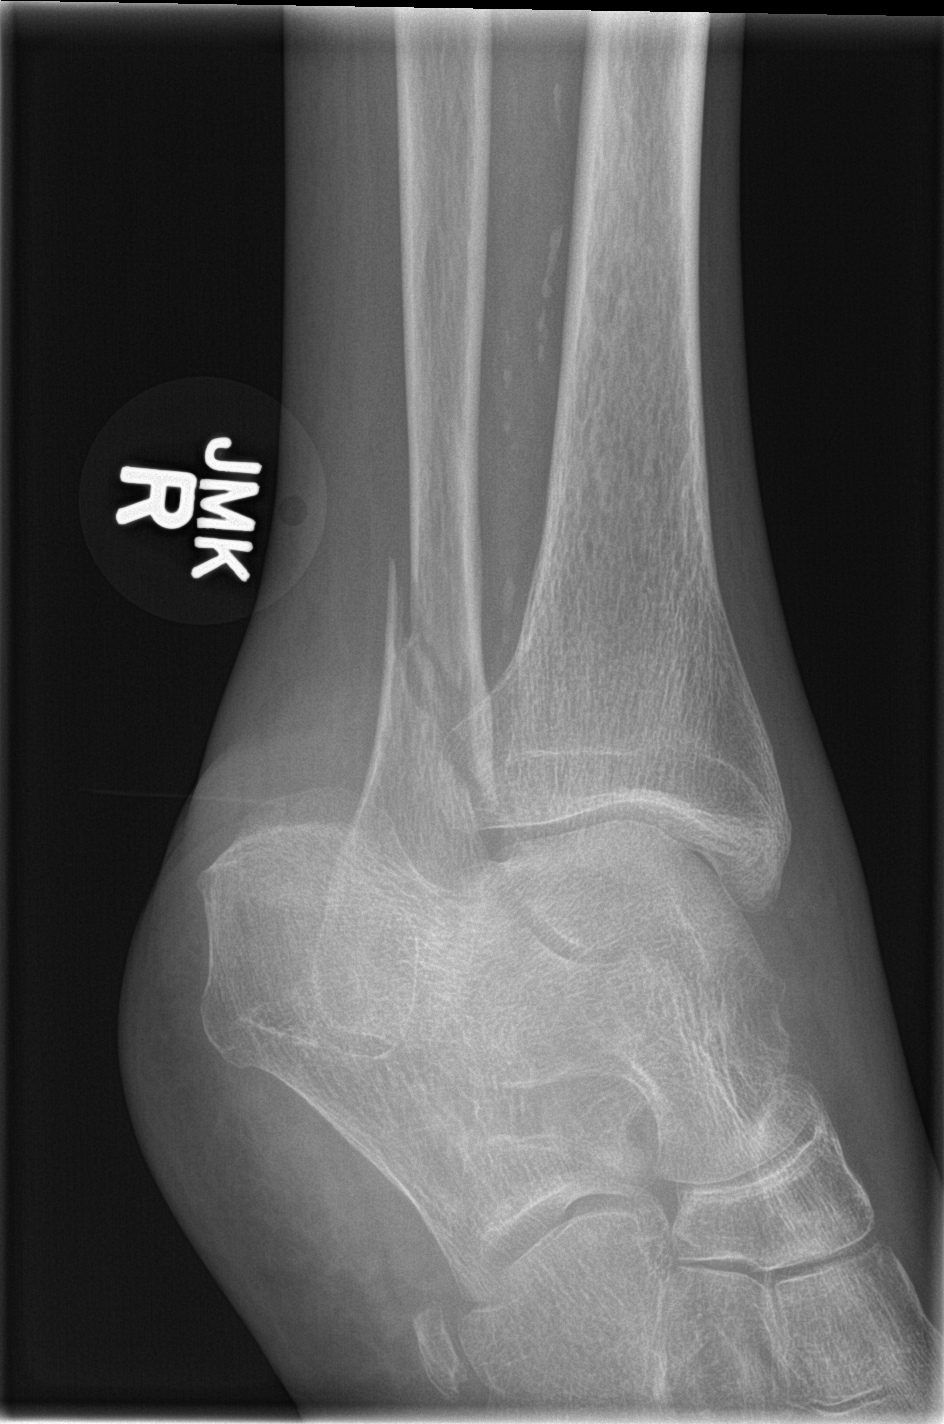

[ankle lat]
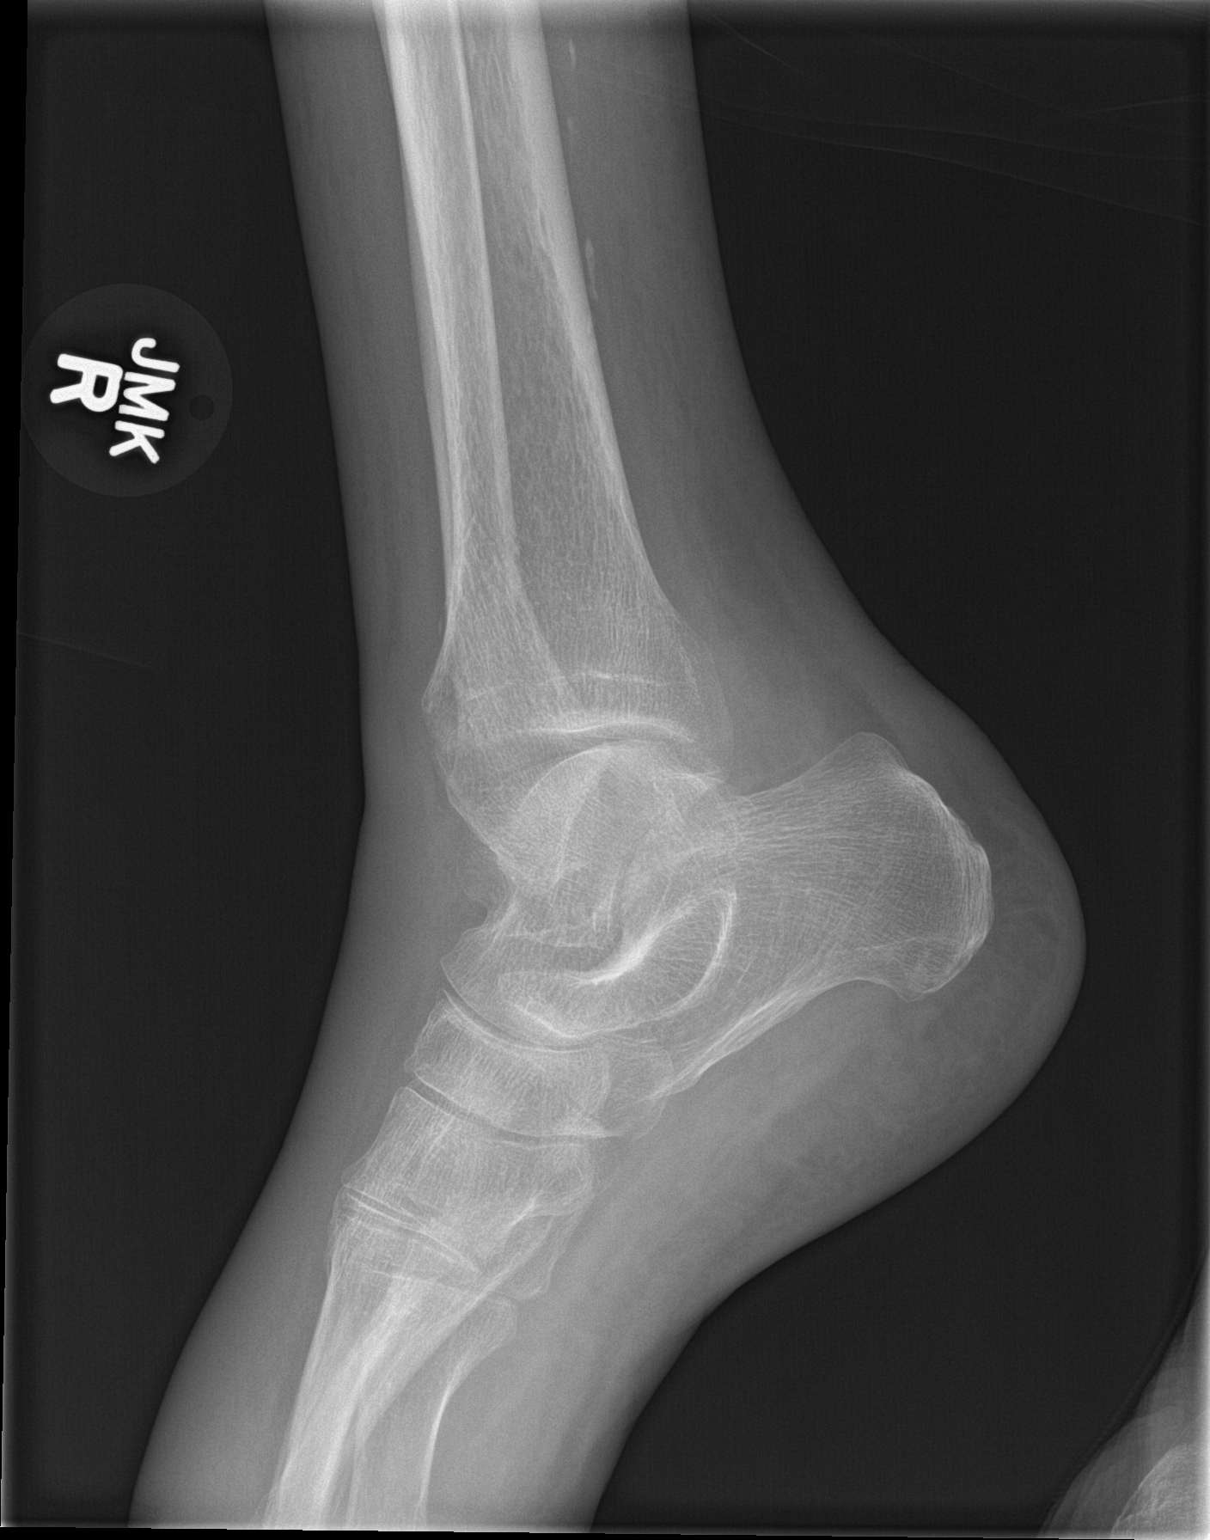

[3 of 3 positions shown; findings below may reference images not displayed]

FINDINGS: Three views of the right ankle submitted. The study is limited by
diffuse osteopenia. Mild displaced oblique fracture in distal right
fibula with adjacent soft tissue swelling. There is mild disruption
of ankle mortise with mild widening of medial tibiotalar joint
space.
IMPRESSION: The study is limited by diffuse osteopenia. Mild displaced oblique
fracture in distal right fibula with adjacent soft tissue swelling.
There is mild disruption of ankle mortise with mild widening of
medial tibiotalar joint space.

## 2016-03-26 ENCOUNTER — Emergency Department (HOSPITAL_COMMUNITY): Payer: Medicare Other

## 2016-03-26 ENCOUNTER — Encounter (HOSPITAL_COMMUNITY): Payer: Self-pay

## 2016-03-26 ENCOUNTER — Emergency Department (HOSPITAL_COMMUNITY)
Admission: EM | Admit: 2016-03-26 | Discharge: 2016-03-26 | Disposition: A | Discharge status: Home / Self Care | Payer: Medicare Other | Attending: Emergency Medicine | Admitting: Emergency Medicine

## 2016-03-26 DIAGNOSIS — E785 Hyperlipidemia, unspecified: Secondary | ICD-10-CM | POA: Insufficient documentation

## 2016-03-26 DIAGNOSIS — F17210 Nicotine dependence, cigarettes, uncomplicated: Secondary | ICD-10-CM | POA: Diagnosis not present

## 2016-03-26 DIAGNOSIS — Z79899 Other long term (current) drug therapy: Secondary | ICD-10-CM | POA: Insufficient documentation

## 2016-03-26 DIAGNOSIS — R519 Headache, unspecified: Secondary | ICD-10-CM

## 2016-03-26 DIAGNOSIS — I10 Essential (primary) hypertension: Secondary | ICD-10-CM | POA: Diagnosis not present

## 2016-03-26 DIAGNOSIS — R51 Headache: Secondary | ICD-10-CM | POA: Diagnosis present

## 2016-03-26 DIAGNOSIS — Z7982 Long term (current) use of aspirin: Secondary | ICD-10-CM | POA: Diagnosis not present

## 2016-03-26 DIAGNOSIS — Z96641 Presence of right artificial hip joint: Secondary | ICD-10-CM | POA: Diagnosis not present

## 2016-03-26 LAB — BASIC METABOLIC PANEL
Anion gap: 12 (ref 5–15)
BUN: 21 mg/dL — ABNORMAL HIGH (ref 6–20)
CO2: 19 mmol/L — ABNORMAL LOW (ref 22–32)
Calcium: 9.9 mg/dL (ref 8.9–10.3)
Chloride: 108 mmol/L (ref 101–111)
Creatinine, Ser: 1.3 mg/dL — ABNORMAL HIGH (ref 0.44–1.00)
GFR calc Af Amer: 46 mL/min — ABNORMAL LOW (ref >60)
GFR calc non Af Amer: 40 mL/min — ABNORMAL LOW (ref >60)
Glucose, Bld: 105 mg/dL — ABNORMAL HIGH (ref 65–99)
Potassium: 3.9 mmol/L (ref 3.5–5.1)
Sodium: 139 mmol/L (ref 135–145)

## 2016-03-26 LAB — CBC WITH DIFFERENTIAL/PLATELET
Basophils Absolute: 0.1 10*3/uL (ref 0.0–0.1)
Basophils Relative: 1 %
Eosinophils Absolute: 0.1 10*3/uL (ref 0.0–0.7)
Eosinophils Relative: 1 %
HCT: 32.8 % — ABNORMAL LOW (ref 36.0–46.0)
Hemoglobin: 11 g/dL — ABNORMAL LOW (ref 12.0–15.0)
Lymphocytes Relative: 26 %
Lymphs Abs: 2 10*3/uL (ref 0.7–4.0)
MCH: 27.6 pg (ref 26.0–34.0)
MCHC: 33.5 g/dL (ref 30.0–36.0)
MCV: 82.4 fL (ref 78.0–100.0)
Monocytes Absolute: 0.7 10*3/uL (ref 0.1–1.0)
Monocytes Relative: 9 %
Neutro Abs: 4.9 10*3/uL (ref 1.7–7.7)
Neutrophils Relative %: 63 %
Platelets: 356 10*3/uL (ref 150–400)
RBC: 3.98 MIL/uL (ref 3.87–5.11)
RDW: 15.3 % (ref 11.5–15.5)
WBC: 7.8 10*3/uL (ref 4.0–10.5)

## 2016-03-26 MED ORDER — SODIUM CHLORIDE 0.9 % IV BOLUS (SEPSIS)
500.0000 mL | Freq: Once | INTRAVENOUS | Status: AC
  Administered 2016-03-26: 500 mL via INTRAVENOUS

## 2016-03-26 MED ORDER — HYDROMORPHONE HCL 1 MG/ML IJ SOLN
1.0000 mg | Freq: Once | INTRAMUSCULAR | Status: AC
  Administered 2016-03-26: 1 mg via INTRAVENOUS
  Filled 2016-03-26: qty 1

## 2016-03-26 MED ORDER — HYDROMORPHONE HCL 1 MG/ML IJ SOLN
0.5000 mg | Freq: Once | INTRAMUSCULAR | Status: AC
  Administered 2016-03-26: 0.5 mg via INTRAVENOUS
  Filled 2016-03-26: qty 1

## 2016-03-26 MED ORDER — ONDANSETRON HCL 4 MG/2ML IJ SOLN
4.0000 mg | Freq: Once | INTRAMUSCULAR | Status: AC
  Administered 2016-03-26: 4 mg via INTRAVENOUS
  Filled 2016-03-26: qty 2

## 2016-03-26 MED ORDER — SODIUM CHLORIDE 0.9 % IV SOLN
INTRAVENOUS | Status: DC
  Administered 2016-03-26: 19:00:00 via INTRAVENOUS

## 2016-03-26 NOTE — Discharge Instructions (Signed)
Return for any new or worse headache. Head CT today without any acute findings.

## 2016-03-26 NOTE — ED Notes (Signed)
Pt ambulates independently and with steady gait at time of discharge. Discharge instructions and follow up information reviewed with patient. No other questions or concerns voiced at this time.  

## 2016-03-26 NOTE — ED Notes (Signed)
Pt reports her headache is worse. Dr. Rogene Houston informed.

## 2016-03-26 NOTE — ED Provider Notes (Signed)
CSN: [id number]     Arrival date & time 03/26/16  1516 History   First MD Initiated Contact with Patient 03/26/16 1606     Chief Complaint  Patient presents with  . Migraine     (Consider location/radiation/quality/duration/timing/severity/associated sxs/prior Treatment) Patient is a 75 y.o. female presenting with migraines. The history is provided by the patient.  Migraine Associated symptoms include headaches. Pertinent negatives include no chest pain, no abdominal pain and no shortness of breath.  Patient with acute onset right-sided headache at 4 this morning may be 5 this morning. Did not resolve with Excedrin. Patient state that 10 out of 10 no nausea no vomiting no fevers no photophobia no visual changes no numbness no weakness. No neck pain. Patient states this is like her migraines. Usually resolves with Excedrin Migraine. Did not do that today.  Past Medical History  Diagnosis Date  . Hyperlipidemia   . Hypertension   . Leg pain   . Joint pain   . Headache(784.0)   . Anxiety   . Arthritis   . Peripheral vascular disease ([place])   . Peripheral neuropathy [facility])    Past Surgical History  Procedure Laterality Date  . Total hip arthroplasty      right  . 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 Joint replacement Right 2012    Hip  . Abdominal angiogram N/A 10/29/2011    Procedure: ABDOMINAL ANGIOGRAM;  Surgeon: Elam Dutch, MD;  Location: [facility] CATH LAB;  Service: Cardiovascular;  Laterality: N/A;  . Back surgery     Family History  Problem Relation Age of Onset  . Heart attack Mother   . Heart disease Mother   . Hyperlipidemia Mother   . Hypertension Mother   . Heart attack Father   . Heart disease Father     Before age 71  . Hyperlipidemia Father   . Hypertension Father   . Cancer Sister   . Aneurysm Brother   . Cancer Brother    Social History  Substance Use Topics  . Smoking status: Current Every Day Smoker -- 0.50 packs/day    Types: Cigarettes   Last Attempt to Quit: 01/27/2014  . Smokeless tobacco: Never Used  . Alcohol Use: No   OB History    No data available     Review of Systems  Constitutional: Negative for fever.  HENT: Negative for congestion.   Eyes: Negative for photophobia, pain and visual disturbance.  Respiratory: Negative for shortness of breath.   Cardiovascular: Negative for chest pain.  Gastrointestinal: Negative for nausea, vomiting and abdominal pain.  Genitourinary: Negative for dysuria.  Musculoskeletal: Negative for back pain and neck pain.  Skin: Negative for rash.  Neurological: Positive for headaches. Negative for seizures, syncope, facial asymmetry, weakness and numbness.  Hematological: Does not bruise/bleed easily.  Psychiatric/Behavioral: Negative for confusion.      Allergies  Review of patient's allergies indicates no known allergies.  Home Medications   Prior to Admission medications   Medication Sig Start Date End Date Taking? Authorizing Provider  Amlodipine-Valsartan-HCTZ 10-320-25 MG TABS Take 1 tablet by mouth daily.   Yes Historical Provider, MD  aspirin-acetaminophen-caffeine (EXCEDRIN MIGRAINE) [phone] MG per tablet Take 1 tablet by mouth every 6 (six) hours as needed for headache.   Yes Historical Provider, MD  busPIRone (BUSPAR) 15 MG tablet Take 15 mg by mouth daily.  12/31/15  Yes Historical Provider, MD  citalopram (CELEXA) 40 MG tablet Take 1 tablet (40 mg total) by mouth daily.  08/30/15  Yes Delfin Gant, NP  clonazePAM (KLONOPIN) 1 MG tablet Take 1 mg by mouth at bedtime. 02/26/16  Yes Historical Provider, MD  diphenhydrAMINE (BENADRYL) 25 mg capsule Take 1 capsule (25 mg total) by mouth every 6 (six) hours as needed for itching. 12/28/15  Yes Varney Biles, MD  ferrous sulfate 325 (65 FE) MG tablet Take 325 mg by mouth daily with breakfast.   Yes Historical Provider, MD  gabapentin (NEURONTIN) 100 MG capsule Take 100 mg by mouth 2 (two) times daily.  11/07/09  Yes  Historical Provider, MD  gabapentin (NEURONTIN) 300 MG capsule Take by mouth at bedtime. 01/06/16  Yes Historical Provider, MD  Multiple Vitamin (MULTIVITAMIN WITH MINERALS) TABS tablet Take 1 tablet by mouth daily. One a Day Women's   Yes Historical Provider, MD  polyethylene glycol (MIRALAX / GLYCOLAX) packet Take 17 g by mouth daily. Patient taking differently: Take 17 g by mouth daily as needed for mild constipation.  08/30/15  Yes Delfin Gant, NP  predniSONE (DELTASONE) 10 MG tablet Take 6 tablets (60 mg total) by mouth daily. Patient taking differently: Take 10 mg by mouth daily.  12/28/15  Yes Varney Biles, MD  rosuvastatin (CRESTOR) 10 MG tablet Take 10 mg by mouth daily.    Yes Historical Provider, MD  traZODone (DESYREL) 50 MG tablet Take 1 tablet (50 mg total) by mouth at bedtime as needed for sleep. 08/30/15  Yes Delfin Gant, NP  HYDROcodone-acetaminophen (NORCO/VICODIN) 5-325 MG tablet Take 1-2 tablets by mouth every 6 (six) hours as needed for moderate pain or severe pain. 08/30/15   Harvel Quale, MD  LORazepam (ATIVAN) 1 MG tablet Take 0.5 tablets (0.5 mg total) by mouth at bedtime. Take as prescribed. Do NOT take greater or more frequently then prescribed. Do NOT take with other sedating medications or ANY alcohol as this can result in death. This medication can impair coordination and reflexes, and cause drowsiness. Do NOT perform tasks in which this would place you in danger as it can make you a FALL RISK. Patient not taking: Reported on 03/26/2016 08/27/15   Voncille Lo, MD  Oxycodone HCl 10 MG TABS Take 1 tablet (10 mg total) by mouth 2 (two) times daily as needed (pain). 08/15/15   Wandra Arthurs, MD   BP 156/103 mmHg  Pulse 79  Temp(Src) 98.5 F (36.9 C) (Oral)  Resp 15  Ht 5\' 3"  (1.6 m)  Wt 42.185 kg  BMI 16.48 kg/m2  SpO2 96% Physical Exam  Constitutional: She is oriented to person, place, and time. She appears well-developed and well-nourished. No  distress.  HENT:  Head: Normocephalic and atraumatic.  Mouth/Throat: Oropharynx is clear and moist.  Eyes: Conjunctivae and EOM are normal. Pupils are equal, round, and reactive to light.  Neck: Normal range of motion. Neck supple.  Cardiovascular: Normal rate, regular rhythm and normal heart sounds.   Pulmonary/Chest: Effort normal and breath sounds normal.  Abdominal: Soft. Bowel sounds are normal. There is no tenderness.  Musculoskeletal: Normal range of motion. She exhibits no edema.  Neurological: She is alert and oriented to person, place, and time. No cranial nerve deficit. She exhibits normal muscle tone. Coordination normal.  Skin: Skin is warm. No rash noted.  Nursing note and vitals reviewed.   ED Course  Procedures (including critical care time) Labs Review Labs Reviewed  CBC WITH DIFFERENTIAL/PLATELET - Abnormal; Notable for the following:    Hemoglobin 11.0 (*)    HCT 32.8 (*)  All other components within normal limits  BASIC METABOLIC PANEL - Abnormal; Notable for the following:    CO2 19 (*)    Glucose, Bld 105 (*)    BUN 21 (*)    Creatinine, Ser 1.30 (*)    GFR calc non Af Amer 40 (*)    GFR calc Af Amer 46 (*)    All other components within normal limits   Results for orders placed or performed during the hospital encounter of 03/26/16  CBC with Differential/Platelet  Result Value Ref Range   WBC 7.8 4.0 - 10.5 K/uL   RBC 3.98 3.87 - 5.11 MIL/uL   Hemoglobin 11.0 (L) 12.0 - 15.0 g/dL   HCT 32.8 (L) 36.0 - 46.0 %   MCV 82.4 78.0 - 100.0 fL   MCH 27.6 26.0 - 34.0 pg   MCHC 33.5 30.0 - 36.0 g/dL   RDW 15.3 11.5 - 15.5 %   Platelets 356 150 - 400 K/uL   Neutrophils Relative % 63 %   Neutro Abs 4.9 1.7 - 7.7 K/uL   Lymphocytes Relative 26 %   Lymphs Abs 2.0 0.7 - 4.0 K/uL   Monocytes Relative 9 %   Monocytes Absolute 0.7 0.1 - 1.0 K/uL   Eosinophils Relative 1 %   Eosinophils Absolute 0.1 0.0 - 0.7 K/uL   Basophils Relative 1 %   Basophils Absolute  0.1 0.0 - 0.1 K/uL  Basic metabolic panel  Result Value Ref Range   Sodium 139 135 - 145 mmol/L   Potassium 3.9 3.5 - 5.1 mmol/L   Chloride 108 101 - 111 mmol/L   CO2 19 (L) 22 - 32 mmol/L   Glucose, Bld 105 (H) 65 - 99 mg/dL   BUN 21 (H) 6 - 20 mg/dL   Creatinine, Ser 1.30 (H) 0.44 - 1.00 mg/dL   Calcium 9.9 8.9 - 10.3 mg/dL   GFR calc non Af Amer 40 (L) >60 mL/min   GFR calc Af Amer 46 (L) >60 mL/min   Anion gap 12 5 - 15    Imaging Review Ct Head Wo Contrast  03/26/2016  CLINICAL DATA:  76 year old female with headache. EXAM: CT HEAD WITHOUT CONTRAST TECHNIQUE: Contiguous axial images were obtained from the base of the skull through the vertex without intravenous contrast. COMPARISON:  Head CT dated 09/12/2012 FINDINGS: There is mild prominence of the ventricles and sulci compatible with age-related atrophy. Mild periventricular and deep white matter chronic microvascular ischemic changes noted. There is no acute intracranial hemorrhage. No mass effect or midline shift noted. The visualized paranasal sinuses and mastoid air cells are clear. The calvarium is intact. IMPRESSION: No acute intracranial hemorrhage. Mild age-related atrophy and chronic microvascular ischemic disease. If symptoms persist and there are no contraindications, MRI may provide better evaluation if clinically indicated Electronically Signed   By: Anner Crete M.D.   On: 03/26/2016 18:22   I have personally reviewed and evaluated these images and lab results as part of my medical decision-making.   EKG Interpretation None      MDM   Final diagnoses:  Acute nonintractable headache, unspecified headache type    Patient with complete resolution of headache here. Head CT was negative. No fevers. Labs without any significant abnormalities. No leukocytosis no significant anemia no significant electrolyte abnormalities. Patient has stated with complete resolution of headache that started on the right side at 4 this  morning. Patient states she's had a history of similar headaches like this in the past. She  calls them migraines but does not carry a history of migraines.    Fredia Sorrow, MD 03/26/16 2022

## 2016-03-26 NOTE — ED Notes (Signed)
Pt. Has a hx of migraines and developed rt. Side headache last night around 400 am.  It woke pt. Up. Pt. Denies any n/v/d/ Denies any photophobia.  She take Excedrin did not help. Pt. Is alert and oriented X 4.  Skin warm and dry. Denies blurred vision. Pt. Has dizziness.

## 2016-03-28 ENCOUNTER — Encounter (HOSPITAL_COMMUNITY): Payer: Self-pay | Admitting: Family Medicine

## 2016-03-28 ENCOUNTER — Emergency Department (HOSPITAL_COMMUNITY)
Admission: EM | Admit: 2016-03-28 | Discharge: 2016-03-28 | Disposition: A | Discharge status: Home / Self Care | Payer: Medicare Other | Attending: Emergency Medicine | Admitting: Emergency Medicine

## 2016-03-28 DIAGNOSIS — G43009 Migraine without aura, not intractable, without status migrainosus: Secondary | ICD-10-CM | POA: Insufficient documentation

## 2016-03-28 DIAGNOSIS — M1990 Unspecified osteoarthritis, unspecified site: Secondary | ICD-10-CM | POA: Diagnosis not present

## 2016-03-28 DIAGNOSIS — Z95828 Presence of other vascular implants and grafts: Secondary | ICD-10-CM | POA: Insufficient documentation

## 2016-03-28 DIAGNOSIS — Z79899 Other long term (current) drug therapy: Secondary | ICD-10-CM | POA: Insufficient documentation

## 2016-03-28 DIAGNOSIS — F17210 Nicotine dependence, cigarettes, uncomplicated: Secondary | ICD-10-CM | POA: Insufficient documentation

## 2016-03-28 DIAGNOSIS — Z96641 Presence of right artificial hip joint: Secondary | ICD-10-CM | POA: Insufficient documentation

## 2016-03-28 DIAGNOSIS — I10 Essential (primary) hypertension: Secondary | ICD-10-CM | POA: Diagnosis not present

## 2016-03-28 DIAGNOSIS — Z7982 Long term (current) use of aspirin: Secondary | ICD-10-CM | POA: Insufficient documentation

## 2016-03-28 DIAGNOSIS — E785 Hyperlipidemia, unspecified: Secondary | ICD-10-CM | POA: Insufficient documentation

## 2016-03-28 DIAGNOSIS — G43909 Migraine, unspecified, not intractable, without status migrainosus: Secondary | ICD-10-CM

## 2016-03-28 MED ORDER — DIPHENHYDRAMINE HCL 50 MG/ML IJ SOLN
25.0000 mg | Freq: Once | INTRAMUSCULAR | Status: AC
  Administered 2016-03-28: 25 mg via INTRAVENOUS
  Filled 2016-03-28: qty 1

## 2016-03-28 MED ORDER — PROCHLORPERAZINE EDISYLATE 5 MG/ML IJ SOLN
10.0000 mg | Freq: Once | INTRAMUSCULAR | Status: AC
  Administered 2016-03-28: 10 mg via INTRAVENOUS
  Filled 2016-03-28: qty 2

## 2016-03-28 MED ORDER — MORPHINE SULFATE (PF) 2 MG/ML IV SOLN
1.0000 mg | Freq: Once | INTRAVENOUS | Status: AC
  Administered 2016-03-28: 1 mg via INTRAVENOUS
  Filled 2016-03-28: qty 1

## 2016-03-28 MED ORDER — SODIUM CHLORIDE 0.9 % IV BOLUS (SEPSIS)
1000.0000 mL | Freq: Once | INTRAVENOUS | Status: AC
  Administered 2016-03-28: 1000 mL via INTRAVENOUS

## 2016-03-28 NOTE — ED Notes (Signed)
Pt here with migraine since Friday. sts was seen here and treated but not better.

## 2016-03-28 NOTE — Discharge Instructions (Signed)
Migraine Headache  A migraine headache is very bad, throbbing pain on one or both sides of your head. Talk to your doctor about what things may bring on (trigger) your migraine headaches.  HOME CARE  · Only take medicines as told by your doctor.  · Lie down in a dark, quiet room when you have a migraine.  · Keep a journal to find out if certain things bring on migraine headaches. For example, write down:    What you eat and drink.    How much sleep you get.    Any change to your diet or medicines.  · Lessen how much alcohol you drink.  · Quit smoking if you smoke.  · Get enough sleep.  · Lessen any stress in your life.  · Keep lights dim if bright lights bother you or make your migraines worse.  GET HELP RIGHT AWAY IF:   · Your migraine becomes really bad.  · You have a fever.  · You have a stiff neck.  · You have trouble seeing.  · Your muscles are weak, or you lose muscle control.  · You lose your balance or have trouble walking.  · You feel like you will pass out (faint), or you pass out.  · You have really bad symptoms that are different than your first symptoms.  MAKE SURE YOU:   · Understand these instructions.  · Will watch your condition.  · Will get help right away if you are not doing well or get worse.     This information is not intended to replace advice given to you by your health care provider. Make sure you discuss any questions you have with your health care provider.     Document Released: 07/13/2008 Document Revised: 12/27/2011 Document Reviewed: 06/11/2013  Elsevier Interactive Patient Education ©2016 [employer]

## 2016-03-28 NOTE — ED Provider Notes (Signed)
CSN: [id number]     Arrival date & time 03/28/16  1445 History   First MD Initiated Contact with Patient 03/28/16 1505     Chief Complaint  Patient presents with  . Migraine     (Consider location/radiation/quality/duration/timing/severity/associated sxs/prior Treatment) HPI  75 yo F with history of migraines who is back here after being seen in the ER for same reason on June 9th. She has had the current episode of migraine since last Thursday, and the pain is 10/10 and localized over the right temple. Says Excedrin helped int he past but did not help currently, and light makes the pain worse. She denies any blurry vision, dyspnea, dizziness.  During the last ER visit 2 days ago, CT head was negative, and all the labs were unremarkable.    Past Medical History  Diagnosis Date  . Hyperlipidemia   . Hypertension   . Leg pain   . Joint pain   . Headache(784.0)   . Anxiety   . Arthritis   . Peripheral vascular disease ([place])   . Peripheral neuropathy [facility])    Past Surgical History  Procedure Laterality Date  . Total hip arthroplasty      right  . 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 Joint replacement Right 2012    Hip  . Abdominal angiogram N/A 10/29/2011    Procedure: ABDOMINAL ANGIOGRAM;  Surgeon: Elam Dutch, MD;  Location: [facility] CATH LAB;  Service: Cardiovascular;  Laterality: N/A;  . Back surgery     Family History  Problem Relation Age of Onset  . Heart attack Mother   . Heart disease Mother   . Hyperlipidemia Mother   . Hypertension Mother   . Heart attack Father   . Heart disease Father     Before age 91  . Hyperlipidemia Father   . Hypertension Father   . Cancer Sister   . Aneurysm Brother   . Cancer Brother    Social History  Substance Use Topics  . Smoking status: Current Every Day Smoker -- 0.50 packs/day    Types: Cigarettes    Last Attempt to Quit: 01/27/2014  . Smokeless tobacco: Never Used  . Alcohol Use: No   OB History    No data  available     Review of Systems  Constitutional: Negative for fever, activity change, appetite change and fatigue.  Eyes: Negative for photophobia and pain.  Respiratory: Negative for cough and shortness of breath.   Cardiovascular: Negative.   Neurological: Positive for headaches. Negative for dizziness, weakness, light-headedness and numbness.      Allergies  Review of patient's allergies indicates no known allergies.  Home Medications   Prior to Admission medications   Medication Sig Start Date End Date Taking? Authorizing Provider  Amlodipine-Valsartan-HCTZ 10-320-25 MG TABS Take 1 tablet by mouth daily.    Historical Provider, MD  aspirin-acetaminophen-caffeine (EXCEDRIN MIGRAINE) [phone] MG per tablet Take 1 tablet by mouth every 6 (six) hours as needed for headache.    Historical Provider, MD  busPIRone (BUSPAR) 15 MG tablet Take 15 mg by mouth daily.  12/31/15   Historical Provider, MD  citalopram (CELEXA) 40 MG tablet Take 1 tablet (40 mg total) by mouth daily. 08/30/15   Delfin Gant, NP  clonazePAM (KLONOPIN) 1 MG tablet Take 1 mg by mouth at bedtime. 02/26/16   Historical Provider, MD  diphenhydrAMINE (BENADRYL) 25 mg capsule Take 1 capsule (25 mg total) by mouth every 6 (six) hours as needed for itching.  12/28/15   Varney Biles, MD  ferrous sulfate 325 (65 FE) MG tablet Take 325 mg by mouth daily with breakfast.    Historical Provider, MD  gabapentin (NEURONTIN) 100 MG capsule Take 100 mg by mouth 2 (two) times daily.  11/07/09   Historical Provider, MD  HYDROcodone-acetaminophen (NORCO/VICODIN) 5-325 MG tablet Take 1-2 tablets by mouth every 6 (six) hours as needed for moderate pain or severe pain. 08/30/15   Harvel Quale, MD  LORazepam (ATIVAN) 1 MG tablet Take 0.5 tablets (0.5 mg total) by mouth at bedtime. Take as prescribed. Do NOT take greater or more frequently then prescribed. Do NOT take with other sedating medications or ANY alcohol as this can result in  death. This medication can impair coordination and reflexes, and cause drowsiness. Do NOT perform tasks in which this would place you in danger as it can make you a FALL RISK. Patient not taking: Reported on 03/26/2016 08/27/15   Voncille Lo, MD  Multiple Vitamin (MULTIVITAMIN WITH MINERALS) TABS tablet Take 1 tablet by mouth daily. One a Day Women's    Historical Provider, MD  Oxycodone HCl 10 MG TABS Take 1 tablet (10 mg total) by mouth 2 (two) times daily as needed (pain). 08/15/15   Wandra Arthurs, MD  polyethylene glycol [facility]) packet Take 17 g by mouth daily. Patient taking differently: Take 17 g by mouth daily as needed for mild constipation.  08/30/15   Delfin Gant, NP  predniSONE (DELTASONE) 10 MG tablet Take 6 tablets (60 mg total) by mouth daily. Patient taking differently: Take 10 mg by mouth daily.  12/28/15   Varney Biles, MD  rosuvastatin (CRESTOR) 10 MG tablet Take 10 mg by mouth daily.     Historical Provider, MD  traZODone (DESYREL) 50 MG tablet Take 1 tablet (50 mg total) by mouth at bedtime as needed for sleep. 08/30/15   Delfin Gant, NP   BP 149/81 mmHg  Pulse 84  Temp(Src) 98.1 F (36.7 C)  Resp 18  SpO2 99% Physical Exam  Constitutional: She is oriented to person, place, and time. She appears well-developed and well-nourished.  HENT:  Head: Normocephalic and atraumatic.  Eyes: Pupils are equal, round, and reactive to light.  Cardiovascular: Normal rate, regular rhythm, normal heart sounds and intact distal pulses.  Exam reveals no friction rub.   No murmur heard. Pulmonary/Chest: Effort normal and breath sounds normal. No respiratory distress.  Neurological: She is alert and oriented to person, place, and time.  Skin: Skin is warm and dry.    ED Course  Procedures (including critical care time) Labs Review Labs Reviewed - No data to display  Imaging Review Ct Head Wo Contrast  03/26/2016  CLINICAL DATA:  75 year old female with  headache. EXAM: CT HEAD WITHOUT CONTRAST TECHNIQUE: Contiguous axial images were obtained from the base of the skull through the vertex without intravenous contrast. COMPARISON:  Head CT dated 09/12/2012 FINDINGS: There is mild prominence of the ventricles and sulci compatible with age-related atrophy. Mild periventricular and deep white matter chronic microvascular ischemic changes noted. There is no acute intracranial hemorrhage. No mass effect or midline shift noted. The visualized paranasal sinuses and mastoid air cells are clear. The calvarium is intact. IMPRESSION: No acute intracranial hemorrhage. Mild age-related atrophy and chronic microvascular ischemic disease. If symptoms persist and there are no contraindications, MRI may provide better evaluation if clinically indicated Electronically Signed   By: Anner Crete M.D.   On: 03/26/2016 18:22  I have personally reviewed and evaluated these images and lab results as part of my medical decision-making.   EKG Interpretation None      MDM  Pt is feeling much better.  Pain is gone. Return if worse. Final diagnoses:  Migraine without status migrainosus, not intractable, unspecified migraine type    75 yo woman with history of headaches, who presents with an episode of migraine since last Thursday, who was evaluated the following day here int he ER. Usually takes Excedrin but did not help this time.   Given headache cocktail 25 mg IV benadryl, and then compazine 10 mg IV.  And given 1 L bolus.   Asked to follow up with her regular physician.      Burgess Estelle, MD 03/28/16 1622  I saw and evaluated the patient, reviewed the resident's note and I agree with the findings and plan.   EKG Interpretation None       Isla Pence, MD 03/28/16 1717

## 2016-03-28 NOTE — ED Notes (Signed)
Patient able to ambulate independently  

## 2016-04-24 ENCOUNTER — Encounter (HOSPITAL_COMMUNITY): Payer: Self-pay | Admitting: *Deleted

## 2016-04-24 ENCOUNTER — Emergency Department (HOSPITAL_COMMUNITY): Payer: Medicare Other

## 2016-04-24 ENCOUNTER — Observation Stay (HOSPITAL_COMMUNITY): Payer: Medicare Other

## 2016-04-24 ENCOUNTER — Observation Stay (HOSPITAL_COMMUNITY)
Admission: EM | Admit: 2016-04-24 | Discharge: 2016-04-26 | Disposition: A | Discharge status: Home / Self Care | Payer: Medicare Other | Attending: Internal Medicine | Admitting: Internal Medicine

## 2016-04-24 DIAGNOSIS — Z7982 Long term (current) use of aspirin: Secondary | ICD-10-CM | POA: Insufficient documentation

## 2016-04-24 DIAGNOSIS — D62 Acute posthemorrhagic anemia: Secondary | ICD-10-CM | POA: Insufficient documentation

## 2016-04-24 DIAGNOSIS — F411 Generalized anxiety disorder: Secondary | ICD-10-CM | POA: Diagnosis present

## 2016-04-24 DIAGNOSIS — Z791 Long term (current) use of non-steroidal anti-inflammatories (NSAID): Secondary | ICD-10-CM | POA: Insufficient documentation

## 2016-04-24 DIAGNOSIS — E785 Hyperlipidemia, unspecified: Secondary | ICD-10-CM | POA: Diagnosis not present

## 2016-04-24 DIAGNOSIS — I739 Peripheral vascular disease, unspecified: Secondary | ICD-10-CM | POA: Insufficient documentation

## 2016-04-24 DIAGNOSIS — Z79899 Other long term (current) drug therapy: Secondary | ICD-10-CM | POA: Diagnosis not present

## 2016-04-24 DIAGNOSIS — I10 Essential (primary) hypertension: Secondary | ICD-10-CM | POA: Diagnosis not present

## 2016-04-24 DIAGNOSIS — G629 Polyneuropathy, unspecified: Secondary | ICD-10-CM | POA: Insufficient documentation

## 2016-04-24 DIAGNOSIS — R55 Syncope and collapse: Secondary | ICD-10-CM | POA: Diagnosis present

## 2016-04-24 DIAGNOSIS — D473 Essential (hemorrhagic) thrombocythemia: Secondary | ICD-10-CM | POA: Insufficient documentation

## 2016-04-24 DIAGNOSIS — Z96641 Presence of right artificial hip joint: Secondary | ICD-10-CM | POA: Insufficient documentation

## 2016-04-24 DIAGNOSIS — N179 Acute kidney failure, unspecified: Secondary | ICD-10-CM | POA: Diagnosis present

## 2016-04-24 DIAGNOSIS — F17210 Nicotine dependence, cigarettes, uncomplicated: Secondary | ICD-10-CM | POA: Diagnosis not present

## 2016-04-24 DIAGNOSIS — M1990 Unspecified osteoarthritis, unspecified site: Secondary | ICD-10-CM | POA: Diagnosis not present

## 2016-04-24 DIAGNOSIS — I129 Hypertensive chronic kidney disease with stage 1 through stage 4 chronic kidney disease, or unspecified chronic kidney disease: Secondary | ICD-10-CM | POA: Insufficient documentation

## 2016-04-24 DIAGNOSIS — D631 Anemia in chronic kidney disease: Secondary | ICD-10-CM | POA: Insufficient documentation

## 2016-04-24 DIAGNOSIS — F419 Anxiety disorder, unspecified: Secondary | ICD-10-CM | POA: Insufficient documentation

## 2016-04-24 DIAGNOSIS — M4806 Spinal stenosis, lumbar region: Secondary | ICD-10-CM | POA: Diagnosis not present

## 2016-04-24 DIAGNOSIS — K921 Melena: Secondary | ICD-10-CM | POA: Diagnosis present

## 2016-04-24 DIAGNOSIS — Z720 Tobacco use: Secondary | ICD-10-CM | POA: Diagnosis present

## 2016-04-24 DIAGNOSIS — N183 Chronic kidney disease, stage 3 (moderate): Secondary | ICD-10-CM | POA: Diagnosis not present

## 2016-04-24 DIAGNOSIS — I959 Hypotension, unspecified: Secondary | ICD-10-CM | POA: Diagnosis present

## 2016-04-24 LAB — URINALYSIS, ROUTINE W REFLEX MICROSCOPIC
Bilirubin Urine: NEGATIVE
Glucose, UA: NEGATIVE mg/dL
Hgb urine dipstick: NEGATIVE
Ketones, ur: NEGATIVE mg/dL
Leukocytes, UA: NEGATIVE
Nitrite: NEGATIVE
Protein, ur: 100 mg/dL — AB
Specific Gravity, Urine: 1.021 (ref 1.005–1.030)
pH: 5.5 (ref 5.0–8.0)

## 2016-04-24 LAB — BASIC METABOLIC PANEL
Anion gap: 13 (ref 5–15)
BUN: 28 mg/dL — ABNORMAL HIGH (ref 6–20)
CO2: 15 mmol/L — ABNORMAL LOW (ref 22–32)
Calcium: 10.2 mg/dL (ref 8.9–10.3)
Chloride: 110 mmol/L (ref 101–111)
Creatinine, Ser: 1.86 mg/dL — ABNORMAL HIGH (ref 0.44–1.00)
GFR calc Af Amer: 30 mL/min — ABNORMAL LOW (ref >60)
GFR calc non Af Amer: 26 mL/min — ABNORMAL LOW (ref >60)
Glucose, Bld: 116 mg/dL — ABNORMAL HIGH (ref 65–99)
Potassium: 3.9 mmol/L (ref 3.5–5.1)
Sodium: 138 mmol/L (ref 135–145)

## 2016-04-24 LAB — URINE MICROSCOPIC-ADD ON: RBC / HPF: NONE SEEN RBC/hpf (ref 0–5)

## 2016-04-24 LAB — CBC
HCT: 35.1 % — ABNORMAL LOW (ref 36.0–46.0)
Hemoglobin: 11.7 g/dL — ABNORMAL LOW (ref 12.0–15.0)
MCH: 28.5 pg (ref 26.0–34.0)
MCHC: 33.3 g/dL (ref 30.0–36.0)
MCV: 85.4 fL (ref 78.0–100.0)
Platelets: 567 10*3/uL — ABNORMAL HIGH (ref 150–400)
RBC: 4.11 MIL/uL (ref 3.87–5.11)
RDW: 15.9 % — ABNORMAL HIGH (ref 11.5–15.5)
WBC: 9.6 10*3/uL (ref 4.0–10.5)

## 2016-04-24 LAB — D-DIMER, QUANTITATIVE (NOT AT ARMC): D-Dimer, Quant: 2.69 ug/mL-FEU — ABNORMAL HIGH (ref 0.00–0.50)

## 2016-04-24 LAB — TROPONIN I: Troponin I: <0.03 ng/mL (ref <0.03)

## 2016-04-24 MED ORDER — NICOTINE 21 MG/24HR TD PT24
21.0000 mg | MEDICATED_PATCH | Freq: Every day | TRANSDERMAL | Status: DC
  Filled 2016-04-24: qty 1

## 2016-04-24 MED ORDER — ONDANSETRON HCL 4 MG/2ML IJ SOLN: 4.0000 mg | Freq: Four times a day (QID) | INTRAMUSCULAR | Status: DC | PRN

## 2016-04-24 MED ORDER — CITALOPRAM HYDROBROMIDE 10 MG PO TABS
40.0000 mg | ORAL_TABLET | Freq: Every day | ORAL | Status: DC
  Administered 2016-04-25 – 2016-04-26 (×2): 40 mg via ORAL
  Filled 2016-04-24 (×2): qty 4

## 2016-04-24 MED ORDER — ACETAMINOPHEN 650 MG RE SUPP: 650.0000 mg | Freq: Four times a day (QID) | RECTAL | Status: DC | PRN

## 2016-04-24 MED ORDER — CLONAZEPAM 0.5 MG PO TABS
1.0000 mg | ORAL_TABLET | Freq: Every evening | ORAL | Status: DC | PRN
  Administered 2016-04-25: 1 mg via ORAL
  Filled 2016-04-24: qty 2

## 2016-04-24 MED ORDER — TECHNETIUM TC 99M DIETHYLENETRIAME-PENTAACETIC ACID: 32.3000 | Freq: Once | INTRAVENOUS | Status: DC | PRN

## 2016-04-24 MED ORDER — FERROUS SULFATE 325 (65 FE) MG PO TABS
325.0000 mg | ORAL_TABLET | Freq: Every day | ORAL | Status: DC
  Administered 2016-04-25 – 2016-04-26 (×2): 325 mg via ORAL
  Filled 2016-04-24 (×2): qty 1

## 2016-04-24 MED ORDER — IPRATROPIUM BROMIDE 0.02 % IN SOLN
0.5000 mg | Freq: Four times a day (QID) | RESPIRATORY_TRACT | Status: DC
  Administered 2016-04-25: 0.5 mg via RESPIRATORY_TRACT
  Filled 2016-04-24: qty 2.5

## 2016-04-24 MED ORDER — DIPHENHYDRAMINE HCL 25 MG PO CAPS: 25.0000 mg | ORAL_CAPSULE | Freq: Four times a day (QID) | ORAL | Status: DC | PRN

## 2016-04-24 MED ORDER — ZOLPIDEM TARTRATE 5 MG PO TABS
5.0000 mg | ORAL_TABLET | Freq: Every day | ORAL | Status: DC
  Administered 2016-04-25 (×2): 5 mg via ORAL
  Filled 2016-04-24 (×2): qty 1

## 2016-04-24 MED ORDER — ALBUTEROL SULFATE (2.5 MG/3ML) 0.083% IN NEBU: 2.5000 mg | INHALATION_SOLUTION | RESPIRATORY_TRACT | Status: DC | PRN

## 2016-04-24 MED ORDER — HYDROCODONE-ACETAMINOPHEN 5-325 MG PO TABS
1.0000 | ORAL_TABLET | Freq: Four times a day (QID) | ORAL | Status: DC | PRN
  Administered 2016-04-25 – 2016-04-26 (×5): 1 via ORAL
  Filled 2016-04-24 (×5): qty 1

## 2016-04-24 MED ORDER — BUSPIRONE HCL 10 MG PO TABS
15.0000 mg | ORAL_TABLET | Freq: Every day | ORAL | Status: DC
  Administered 2016-04-25 – 2016-04-26 (×2): 15 mg via ORAL
  Filled 2016-04-24 (×3): qty 2

## 2016-04-24 MED ORDER — ONDANSETRON HCL 4 MG PO TABS: 4.0000 mg | ORAL_TABLET | Freq: Four times a day (QID) | ORAL | Status: DC | PRN

## 2016-04-24 MED ORDER — CLONAZEPAM 0.5 MG PO TABS: 1.0000 mg | ORAL_TABLET | Freq: Every day | ORAL | Status: DC

## 2016-04-24 MED ORDER — SODIUM CHLORIDE 0.9 % IV SOLN
INTRAVENOUS | Status: DC
  Administered 2016-04-25 – 2016-04-26 (×4): via INTRAVENOUS

## 2016-04-24 MED ORDER — SODIUM CHLORIDE 0.9 % IV BOLUS (SEPSIS)
1000.0000 mL | Freq: Once | INTRAVENOUS | Status: AC
  Administered 2016-04-24: 1000 mL via INTRAVENOUS

## 2016-04-24 MED ORDER — ADULT MULTIVITAMIN W/MINERALS CH
1.0000 | ORAL_TABLET | Freq: Every day | ORAL | Status: DC
  Administered 2016-04-25 – 2016-04-26 (×2): 1 via ORAL
  Filled 2016-04-24 (×2): qty 1

## 2016-04-24 MED ORDER — TRAZODONE HCL 50 MG PO TABS: 50.0000 mg | ORAL_TABLET | Freq: Every evening | ORAL | Status: DC | PRN

## 2016-04-24 MED ORDER — PANTOPRAZOLE SODIUM 40 MG PO TBEC: 40.0000 mg | DELAYED_RELEASE_TABLET | Freq: Two times a day (BID) | ORAL | Status: DC

## 2016-04-24 MED ORDER — ROSUVASTATIN CALCIUM 10 MG PO TABS
10.0000 mg | ORAL_TABLET | Freq: Every day | ORAL | Status: DC
  Administered 2016-04-25 – 2016-04-26 (×2): 10 mg via ORAL
  Filled 2016-04-24 (×2): qty 1

## 2016-04-24 MED ORDER — ACETAMINOPHEN 325 MG PO TABS
650.0000 mg | ORAL_TABLET | Freq: Four times a day (QID) | ORAL | Status: DC | PRN
Start: 2016-04-24 — End: 2016-04-26
  Administered 2016-04-25 (×2): 650 mg via ORAL
  Filled 2016-04-24 (×3): qty 2

## 2016-04-24 MED ORDER — HYDROCODONE-ACETAMINOPHEN 5-325 MG PO TABS
1.0000 | ORAL_TABLET | Freq: Once | ORAL | Status: AC
  Administered 2016-04-24: 1 via ORAL
  Filled 2016-04-24: qty 1

## 2016-04-24 MED ORDER — SODIUM CHLORIDE 0.9% FLUSH
3.0000 mL | Freq: Two times a day (BID) | INTRAVENOUS | Status: DC
  Administered 2016-04-25: 3 mL via INTRAVENOUS

## 2016-04-24 MED ORDER — GABAPENTIN 100 MG PO CAPS
100.0000 mg | ORAL_CAPSULE | Freq: Two times a day (BID) | ORAL | Status: DC
  Administered 2016-04-25 – 2016-04-26 (×4): 100 mg via ORAL
  Filled 2016-04-24 (×4): qty 1

## 2016-04-24 NOTE — ED Notes (Addendum)
Pt says this morning she stood up, felt dizzy, and fell to the ground. Reports she thinks she had LOC "for a second." She c/o pain in the left foot great toe and left knee. Pt says she is still feeling dizzy, but the nausea has subsided.

## 2016-04-24 NOTE — ED Notes (Signed)
Attempted to call report. RN unavailable due to pt care

## 2016-04-24 NOTE — ED Notes (Signed)
Attempted to call report. RN unavaliable due to pt care. I offered to bring pt up and give bedside report. Charge RN called back ED and stated " we do not do bedside reporting and he will call me back when he got time." This was the third attempt to give report

## 2016-04-24 NOTE — ED Notes (Signed)
Notified radiology to call in [facility] staff for VQ scan

## 2016-04-24 NOTE — ED Notes (Addendum)
Pt is still in radiology, will page MD once pt returns and take pt up to floor after assessment by MD [phone]

## 2016-04-24 NOTE — ED Notes (Signed)
Report called RN unavailable due to pt care

## 2016-04-24 NOTE — H&P (Signed)
History and Physical    Alyssa [name] [id number] DOB: 1941/01/29 DOA: 04/24/2016  Referring MD/NP/PA: Dr. Regenia Skeeter PCP: [name], NP  Patient coming from: Home  Chief Complaint: I passed out  HPI: Alyssa [name] is a 75 y.o. female with medical history significant of HTN, HLD, PVD, anxiety, and tobacco abuse who presents after passing out at home. She recalls waking up this morning she and she had gotten out of bed to go make herself some breakfast. She felt lightheaded and passed out. She does not recall how long she was out, but believes it was only for a few seconds or so. She was able to get up, and denies sustaining any significant injuries. Since that time she's had associated symptoms of shortness of breath, decreased appetite, nausea, and continued lightheadedness/dizziness. She reports that normally  had weighed around 93 pounds, but has been weighting around 100 lbs. Shortness of breath symptoms are only when ambulating or exerting herself and not at rest. Reports that she's had shortness of breathe and passed out before in the past, but only can recall that she was told that symptoms were related to her blood sugars being low. Patient is not on any diabetic medications. Denies any significant weight loss, recent travel, sedentary lifestyle, calf pain, chest pain, diaphoresis, or vomiting.  Patient notes that she continues to smoke although she would like to quit. Stools are dark as she is on ferrous sulfate. She does state regular use of ibuprofen and aspirin anywhere from 2-3 times per week and sometimes 2 times in a day as needed for joint pains and headaches.  ED Course: Upon admission to the emergency department patient was evaluated and seen to be afebrile, pulse of 101, respirations of 20, and blood pressure/ O2 saturations maintained on room air. Lab work revealed WBC 9.6, hemoglobin 11.7, platelets 567, sodium 138, potassium 3.9, chloride 110, CO2 15, BUN  20, creatinine 1.86, calcium 10.2, troponin <0.03, and glucose 160. D-dimer elevated at 2.69. Urinalysis. To be negative for any signs of infection. Imaging showed no acute signs of fracture or infiltrate. CT angiogram could not be performed secondary to patient's kidney function therefore VQ scan was ordered. TRH called to admit  Review of Systems: As per HPI otherwise 10 point review of systems negative.   Past Medical History  Diagnosis Date  . Hyperlipidemia   . Hypertension   . Leg pain   . Joint pain   . Headache(784.0)   . Anxiety   . Arthritis   . Peripheral vascular disease ([place])   . Peripheral neuropathy [facility])     Past Surgical History  Procedure Laterality Date  . Total hip arthroplasty      right  . 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 Joint replacement Right 2012    Hip  . Abdominal angiogram N/A 10/29/2011    Procedure: ABDOMINAL ANGIOGRAM;  Surgeon: Elam Dutch, MD;  Location: [facility] CATH LAB;  Service: Cardiovascular;  Laterality: N/A;  . Back surgery       reports that she has been smoking Cigarettes.  She has been smoking about 0.50 packs per day. She has never used smokeless tobacco. She reports that she does not drink alcohol or use illicit drugs.  No Known Allergies  Family History  Problem Relation Age of Onset  . Heart attack Mother   . Heart disease Mother   . Hyperlipidemia Mother   . Hypertension Mother   . Heart attack Father   .  Heart disease Father     Before age 68  . Hyperlipidemia Father   . Hypertension Father   . Cancer Sister   . Aneurysm Brother   . Cancer Brother     Prior to Admission medications   Medication Sig Start Date End Date Taking? Authorizing Provider  Amlodipine-Valsartan-HCTZ 10-320-25 MG TABS Take 1 tablet by mouth daily.   Yes Historical Provider, MD  Aspirin-Acetaminophen (GOODYS BODY PAIN PO) Take 2 packets by mouth daily as needed (pain).   Yes Historical Provider, MD  aspirin-acetaminophen-caffeine  (EXCEDRIN MIGRAINE) [phone] MG per tablet Take 1 tablet by mouth every 6 (six) hours as needed for headache.   Yes Historical Provider, MD  busPIRone (BUSPAR) 15 MG tablet Take 15 mg by mouth daily.  12/31/15  Yes Historical Provider, MD  citalopram (CELEXA) 40 MG tablet Take 1 tablet (40 mg total) by mouth daily. Patient taking differently: Take 40 mg by mouth daily after lunch.  08/30/15  Yes Delfin Gant, NP  clonazePAM (KLONOPIN) 1 MG tablet Take 1 mg by mouth at bedtime. 02/26/16  Yes Historical Provider, MD  diphenhydrAMINE (BENADRYL) 25 mg capsule Take 1 capsule (25 mg total) by mouth every 6 (six) hours as needed for itching. 12/28/15  Yes Varney Biles, MD  ferrous sulfate 325 (65 FE) MG tablet Take 325 mg by mouth daily with breakfast.   Yes Historical Provider, MD  gabapentin (NEURONTIN) 100 MG capsule Take 100 mg by mouth 2 (two) times daily.  11/07/09  Yes Historical Provider, MD  ibuprofen (ADVIL,MOTRIN) 200 MG tablet Take 400 mg by mouth daily as needed (pain).   Yes Historical Provider, MD  Multiple Vitamin (MULTIVITAMIN WITH MINERALS) TABS tablet Take 1 tablet by mouth daily. One a Day Women's   Yes Historical Provider, MD  polyethylene glycol (MIRALAX / GLYCOLAX) packet Take 17 g by mouth daily. Patient taking differently: Take 17 g by mouth daily as needed for mild constipation. Mix in 8 oz liquid and drink 08/30/15  Yes Delfin Gant, NP  rosuvastatin (CRESTOR) 10 MG tablet Take 10 mg by mouth daily.    Yes Historical Provider, MD  traZODone (DESYREL) 50 MG tablet Take 1 tablet (50 mg total) by mouth at bedtime as needed for sleep. 08/30/15  Yes Delfin Gant, NP  HYDROcodone-acetaminophen (NORCO/VICODIN) 5-325 MG tablet Take 1-2 tablets by mouth every 6 (six) hours as needed for moderate pain or severe pain. Patient not taking: Reported on 04/24/2016 08/30/15   Harvel Quale, MD  LORazepam (ATIVAN) 1 MG tablet Take 0.5 tablets (0.5 mg total) by mouth at bedtime.  Take as prescribed. Do NOT take greater or more frequently then prescribed. Do NOT take with other sedating medications or ANY alcohol as this can result in death. This medication can impair coordination and reflexes, and cause drowsiness. Do NOT perform tasks in which this would place you in danger as it can make you a FALL RISK. Patient not taking: Reported on 03/26/2016 08/27/15   Voncille Lo, MD  Oxycodone HCl 10 MG TABS Take 1 tablet (10 mg total) by mouth 2 (two) times daily as needed (pain). Patient not taking: Reported on 04/24/2016 08/15/15   Wandra Arthurs, MD    Physical Exam: Constitutional: Frail elderly female in no acute distress at this time Filed Vitals:   04/24/16 1849 04/24/16 1945 04/24/16 2030 04/24/16 2130  BP: 147/92 140/89 134/86 142/97  Pulse: 95 92 90 93  Temp: 98.1 F (36.7 C)  TempSrc: Oral     Resp: 16 16 17 20   Height:      Weight:      SpO2: 99% 97% 100% 99%   Eyes: PERRL, lids and conjunctivae normal ENMT: Mucous membranes are moist. Posterior pharynx clear of any exudate or lesions.Normal dentition.  Neck: normal, supple, no masses, no thyromegaly Respiratory: clear to auscultation bilaterally, no wheezing, no crackles. Normal respiratory effort. No accessory muscle use.  Cardiovascular: Regular rate and rhythm, no murmurs / rubs / gallops. No extremity edema. 2+ pedal pulses. No carotid bruits.  Abdomen: no tenderness, no masses palpated. No hepatosplenomegaly. Bowel sounds positive.  Musculoskeletal: no clubbing / cyanosis. No joint deformity upper and lower extremities. Good ROM, no contractures. Normal muscle tone.  Skin: no rashes, lesions, ulcers. No induration Neurologic: CN 2-12 grossly intact. Sensation intact, DTR normal. Strength 5/5 in all 4.  Psychiatric: Normal judgment and insight. Alert and oriented x 3. Normal mood.     Labs on Admission: I have personally reviewed following labs and imaging studies  CBC:  Recent Labs Lab  04/24/16 1826  WBC 9.6  HGB 11.7*  HCT 35.1*  MCV 85.4  PLT [id number]*   Basic Metabolic Panel:  Recent Labs Lab 04/24/16 1826  NA 138  K 3.9  CL 110  CO2 15*  GLUCOSE 116*  BUN 28*  CREATININE 1.86*  CALCIUM 10.2   GFR: Estimated Creatinine Clearance: 19.6 mL/min (by C-G formula based on Cr of 1.86). Liver Function Tests: No results for input(s): AST, ALT, ALKPHOS, BILITOT, PROT, ALBUMIN in the last 168 hours. No results for input(s): LIPASE, AMYLASE in the last 168 hours. No results for input(s): AMMONIA in the last 168 hours. Coagulation Profile: No results for input(s): INR, PROTIME in the last 168 hours. Cardiac Enzymes:  Recent Labs Lab 04/24/16 1940  TROPONINI <0.03   BNP (last 3 results) No results for input(s): PROBNP in the last 8760 hours. HbA1C: No results for input(s): HGBA1C in the last 72 hours. CBG: No results for input(s): GLUCAP in the last 168 hours. Lipid Profile: No results for input(s): CHOL, HDL, LDLCALC, TRIG, CHOLHDL, LDLDIRECT in the last 72 hours. Thyroid Function Tests: No results for input(s): TSH, T4TOTAL, FREET4, T3FREE, THYROIDAB in the last 72 hours. Anemia Panel: No results for input(s): VITAMINB12, FOLATE, FERRITIN, TIBC, IRON, RETICCTPCT in the last 72 hours. Urine analysis:    Component Value Date/Time   COLORURINE YELLOW 04/24/2016 1858   APPEARANCEUR CLOUDY* 04/24/2016 1858   LABSPEC 1.021 04/24/2016 1858   PHURINE 5.5 04/24/2016 1858   GLUCOSEU NEGATIVE 04/24/2016 1858   HGBUR NEGATIVE 04/24/2016 1858   BILIRUBINUR NEGATIVE 04/24/2016 1858   KETONESUR NEGATIVE 04/24/2016 1858   PROTEINUR 100* 04/24/2016 1858   UROBILINOGEN 0.2 08/24/2015 1737   NITRITE NEGATIVE 04/24/2016 1858   LEUKOCYTESUR NEGATIVE 04/24/2016 1858   Sepsis Labs: No results found for this or any previous visit (from the past 240 hour(s)).   Radiological Exams on Admission: Dg Chest 2 View  04/24/2016  CLINICAL DATA:  75 year old female with  history of syncope and dyspnea for 1 day. EXAM: CHEST  2 VIEW COMPARISON:  Chest x-ray 08/24/2015. FINDINGS: Emphysematous changes are noted throughout the lungs bilaterally. Lung volumes appear slightly increased. No consolidative airspace disease. No pleural effusions. No pneumothorax. Prominent bilateral nipple shadows incidentally noted. No pulmonary nodule or mass noted. Pulmonary vasculature and the cardiomediastinal silhouette are within normal limits. Atherosclerosis in the thoracic aorta. IMPRESSION: 1. No radiographic evidence of acute cardiopulmonary  disease. 2. Aortic atherosclerosis. 3. Emphysema. Electronically Signed   By: Vinnie Langton M.D.   On: 04/24/2016 19:37   Dg Knee Complete 4 Views Left  04/24/2016  CLINICAL DATA:  Syncope and dyspnea for 1 day. Medial left knee pain. EXAM: LEFT KNEE - COMPLETE 4+ VIEW COMPARISON:  None. FINDINGS: Chondrocalcinosis is identified in the lateral and medial compartments. Tricompartmental degenerative changes are seen as well in all 3 compartments. There is a small suprapatellar joint effusion. No acute fractures are seen. IMPRESSION: Chondrocalcinosis consistent with CPPD/pseudogout. Severe tricompartmental degenerative changes. Small suprapatellar joint effusion. Electronically Signed   By: Dorise Bullion III M.D   On: 04/24/2016 19:38   Dg Foot Complete Left  04/24/2016  CLINICAL DATA:  Medial left foot pain for 3 days. Distal foot and toe pain. EXAM: LEFT FOOT - COMPLETE 3+ VIEW COMPARISON:  None. FINDINGS: There is no evidence of fracture or dislocation. Hammertoe deformity of the digits. Scattered osteoarthritis throughout the midfoot, great toe, and digits. No evidence of inflammatory arthropathy. Bones appear under mineralized diffusely. No localizing soft tissue abnormality. IMPRESSION: No acute bony abnormality. Scattered osteoarthritis. Hammertoe deformity of the digits. Electronically Signed   By: Jeb Levering M.D.   On: 04/24/2016 19:38     EKG: Independently reviewed.  Sinus tachycardia  Assessment/Plan Syncope: Acute. Patient reports standing up and walking to the restroom and she syncopized. Patient found to have elevated d-dimer for which there was concern for possible PE the patient gives no other risk factors. Also on the differential with the elevated and history of NSAID use including upper GI bleed - Admit to a telemetry - Check orthostatic vital signs - Check TSH   Addendum: Melena /suspected upper GI bleed: Patient with reports of ibuprofen and NSAID use 2-3 times week Guaiac  positive stool. - T&S - Check H&H every 4 hours 3 - Protonix drip   - Clear liquid diet for now - Will need to formally consult GI in a.m.   Suspected acute blood loss anemia: Hemoglobin noted to be 11.7 on admission. - Continue ferrous sulfate - Checking guaiac stool  Elevated d-dimer: D-dimer on arrival 2.69. - Check VQ scan - Therapeutic Lovenox for now, per pharmacy    Acute kidney injury: Cr 1.86 with a BUN 28. Previously baseline creatinine 1.3 approximately 1 month ago. Suspect possibility of a prerenal source. - IV fluids normal saline - Will need to reiterate the need of decreased NSAID use - Avoid nephrotoxic agents   Essential hypertension - Held patient's home combination pill of amlodipine-valsartan-HCTZ - Continue amlodipine and valsartan  Anxiety - Continue buspirone and clonazepam  Tobacco abuse: Patient notes that she currently continues to smoke but would like to try and quit. She would like to at least try the nicotine patch while here in the hospital. - Nicotine patch  - Counseled the patient on a near cessation of tobacco  Hyperlipidemia  - Continue Crestor    Thrombocytosis: Platelet count elevated at 567. This could be secondary to patient's iron deficiency anemia and iron supplementation. - Continue to monitor repeat CBC in a.m.  GI prophylaxis - Protonix twice a day  DVT prophylaxis:   SCDs Code Status: Full Family Communication:  No family present at bedside Disposition Plan: Will discharge home in 2-3 days Consults called: None Admission status: Telemetry observation  Norval Morton MD [facility] Pager [phone]  If 7PM-7AM, please contact night-coverage [email or web address] Password [email or web address]  04/24/2016, 9:59 PM

## 2016-04-24 NOTE — ED Provider Notes (Addendum)
CSN: [id number]     Arrival date & time 04/24/16  1755 History   First MD Initiated Contact with Patient 04/24/16 1849     Chief Complaint  Patient presents with  . Fall  . Loss of Consciousness     (Consider location/radiation/quality/duration/timing/severity/associated sxs/prior Treatment) HPI  75 year old female presents with dizziness and loss of consciousness. States that this morning shortly after waking up she was walking to make herself breakfast and all of a sudden passed out. She felt lightheaded. After passing out and awakening she felt short of breath. She has been feeling short of breath with exertion over the last 24 hours. Has had dyspnea before and been told it was from glucose problems. Denies chest pain. Not dyspneic at rest. Since the syncope, has felt lightheaded like she's going to pass out when standing or walking. No headache or head injury. No new weakness/numbness. Complains of pain in left foot/great toe and left knee. Has been present since back surgery a few months ago. Foot has not been swollen but her left knee has been swollen for at least 3 weeks. No redness. No fevers.  Past Medical History  Diagnosis Date  . Hyperlipidemia   . Hypertension   . Leg pain   . Joint pain   . Headache(784.0)   . Anxiety   . Arthritis   . Peripheral vascular disease ([place])   . Peripheral neuropathy [facility])    Past Surgical History  Procedure Laterality Date  . Total hip arthroplasty      right  . 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 Joint replacement Right 2012    Hip  . Abdominal angiogram N/A 10/29/2011    Procedure: ABDOMINAL ANGIOGRAM;  Surgeon: Elam Dutch, MD;  Location: [facility] CATH LAB;  Service: Cardiovascular;  Laterality: N/A;  . Back surgery     Family History  Problem Relation Age of Onset  . Heart attack Mother   . Heart disease Mother   . Hyperlipidemia Mother   . Hypertension Mother   . Heart attack Father   . Heart disease Father     Before  age 39  . Hyperlipidemia Father   . Hypertension Father   . Cancer Sister   . Aneurysm Brother   . Cancer Brother    Social History  Substance Use Topics  . Smoking status: Current Every Day Smoker -- 0.50 packs/day    Types: Cigarettes    Last Attempt to Quit: 01/27/2014  . Smokeless tobacco: Never Used  . Alcohol Use: No   OB History    No data available     Review of Systems  Constitutional: Negative for fever.  Respiratory: Positive for shortness of breath. Negative for cough.   Cardiovascular: Negative for chest pain and leg swelling.  Gastrointestinal: Negative for nausea, vomiting and abdominal pain.  Musculoskeletal: Positive for arthralgias. Negative for back pain.  Neurological: Positive for syncope and light-headedness. Negative for weakness, numbness and headaches.  All other systems reviewed and are negative.     Allergies  Review of patient's allergies indicates no known allergies.  Home Medications   Prior to Admission medications   Medication Sig Start Date End Date Taking? Authorizing Provider  Amlodipine-Valsartan-HCTZ 10-320-25 MG TABS Take 1 tablet by mouth daily.    Historical Provider, MD  aspirin-acetaminophen-caffeine (EXCEDRIN MIGRAINE) [id number] MG per tablet Take 1 tablet by mouth every 6 (six) hours as needed for headache.    Historical Provider, MD  busPIRone (BUSPAR) 15 MG tablet  Take 15 mg by mouth daily.  12/31/15   Historical Provider, MD  citalopram (CELEXA) 40 MG tablet Take 1 tablet (40 mg total) by mouth daily. 08/30/15   Delfin Gant, NP  clonazePAM (KLONOPIN) 1 MG tablet Take 1 mg by mouth at bedtime. 02/26/16   Historical Provider, MD  diphenhydrAMINE (BENADRYL) 25 mg capsule Take 1 capsule (25 mg total) by mouth every 6 (six) hours as needed for itching. 12/28/15   Varney Biles, MD  ferrous sulfate 325 (65 FE) MG tablet Take 325 mg by mouth daily with breakfast.    Historical Provider, MD  gabapentin (NEURONTIN) 100 MG  capsule Take 100 mg by mouth 2 (two) times daily.  11/07/09   Historical Provider, MD  HYDROcodone-acetaminophen (NORCO/VICODIN) 5-325 MG tablet Take 1-2 tablets by mouth every 6 (six) hours as needed for moderate pain or severe pain. 08/30/15   Harvel Quale, MD  LORazepam (ATIVAN) 1 MG tablet Take 0.5 tablets (0.5 mg total) by mouth at bedtime. Take as prescribed. Do NOT take greater or more frequently then prescribed. Do NOT take with other sedating medications or ANY alcohol as this can result in death. This medication can impair coordination and reflexes, and cause drowsiness. Do NOT perform tasks in which this would place you in danger as it can make you a FALL RISK. Patient not taking: Reported on 03/26/2016 08/27/15   Voncille Lo, MD  Multiple Vitamin (MULTIVITAMIN WITH MINERALS) TABS tablet Take 1 tablet by mouth daily. One a Day Women's    Historical Provider, MD  Oxycodone HCl 10 MG TABS Take 1 tablet (10 mg total) by mouth 2 (two) times daily as needed (pain). 08/15/15   Wandra Arthurs, MD  polyethylene glycol [facility] Floria Raveling) packet Take 17 g by mouth daily. Patient taking differently: Take 17 g by mouth daily as needed for mild constipation.  08/30/15   Delfin Gant, NP  predniSONE (DELTASONE) 10 MG tablet Take 6 tablets (60 mg total) by mouth daily. Patient taking differently: Take 10 mg by mouth daily.  12/28/15   Varney Biles, MD  rosuvastatin (CRESTOR) 10 MG tablet Take 10 mg by mouth daily.     Historical Provider, MD  traZODone (DESYREL) 50 MG tablet Take 1 tablet (50 mg total) by mouth at bedtime as needed for sleep. 08/30/15   Delfin Gant, NP   BP 147/92 mmHg  Pulse 95  Temp(Src) 98.1 F (36.7 C) (Oral)  Resp 16  Ht 5\' 3"  (1.6 m)  Wt 100 lb (45.36 kg)  BMI 17.72 kg/m2  SpO2 99% Physical Exam  Constitutional: She is oriented to person, place, and time. She appears well-developed and well-nourished.  HENT:  Head: Normocephalic and atraumatic.  Right Ear:  External ear normal.  Left Ear: External ear normal.  Nose: Nose normal.  Eyes: Right eye exhibits no discharge. Left eye exhibits no discharge.  Cardiovascular: Normal rate, regular rhythm and normal heart sounds.   Pulmonary/Chest: Effort normal and breath sounds normal. She has no wheezes. She has no rales.  Abdominal: Soft. She exhibits no distension. There is no tenderness.  Musculoskeletal:       Left knee: She exhibits swelling. She exhibits normal range of motion, no effusion and no erythema. Tenderness found. Medial joint line tenderness noted.       Legs:      Left foot: There is tenderness. There is normal range of motion, no swelling and normal capillary refill.  Feet:  Neurological: She is alert and oriented to person, place, and time.  CN 3-12 grossly intact. 5/5 strength in all 4 extremities. Grossly normal sensation. Normal finger to nose  Skin: Skin is warm and dry.  Nursing note and vitals reviewed.   ED Course  Procedures (including critical care time) Labs Review Labs Reviewed  BASIC METABOLIC PANEL - Abnormal; Notable for the following:    CO2 15 (*)    Glucose, Bld 116 (*)    BUN 28 (*)    Creatinine, Ser 1.86 (*)    GFR calc non Af Amer 26 (*)    GFR calc Af Amer 30 (*)    All other components within normal limits  CBC - Abnormal; Notable for the following:    Hemoglobin 11.7 (*)    HCT 35.1 (*)    RDW 15.9 (*)    Platelets 567 (*)    All other components within normal limits  URINALYSIS, ROUTINE W REFLEX MICROSCOPIC (NOT AT [name]) - Abnormal; Notable for the following:    APPearance CLOUDY (*)    Protein, ur 100 (*)    All other components within normal limits  D-DIMER, QUANTITATIVE (NOT AT [facility]) - Abnormal; Notable for the following:    D-Dimer, Quant 2.69 (*)    All other components within normal limits  URINE MICROSCOPIC-ADD ON - Abnormal; Notable for the following:    Squamous Epithelial / LPF 6-30 (*)    Bacteria, UA RARE (*)    Casts  HYALINE CASTS (*)    All other components within normal limits  TROPONIN I    Imaging Review Dg Chest 2 View  04/24/2016  CLINICAL DATA:  75 year old female with history of syncope and dyspnea for 1 day. EXAM: CHEST  2 VIEW COMPARISON:  Chest x-ray 08/24/2015. FINDINGS: Emphysematous changes are noted throughout the lungs bilaterally. Lung volumes appear slightly increased. No consolidative airspace disease. No pleural effusions. No pneumothorax. Prominent bilateral nipple shadows incidentally noted. No pulmonary nodule or mass noted. Pulmonary vasculature and the cardiomediastinal silhouette are within normal limits. Atherosclerosis in the thoracic aorta. IMPRESSION: 1. No radiographic evidence of acute cardiopulmonary disease. 2. Aortic atherosclerosis. 3. Emphysema. Electronically Signed   By: Vinnie Langton M.D.   On: 04/24/2016 19:37   Dg Knee Complete 4 Views Left  04/24/2016  CLINICAL DATA:  Syncope and dyspnea for 1 day. Medial left knee pain. EXAM: LEFT KNEE - COMPLETE 4+ VIEW COMPARISON:  None. FINDINGS: Chondrocalcinosis is identified in the lateral and medial compartments. Tricompartmental degenerative changes are seen as well in all 3 compartments. There is a small suprapatellar joint effusion. No acute fractures are seen. IMPRESSION: Chondrocalcinosis consistent with CPPD/pseudogout. Severe tricompartmental degenerative changes. Small suprapatellar joint effusion. Electronically Signed   By: Dorise Bullion III M.D   On: 04/24/2016 19:38   Dg Foot Complete Left  04/24/2016  CLINICAL DATA:  Medial left foot pain for 3 days. Distal foot and toe pain. EXAM: LEFT FOOT - COMPLETE 3+ VIEW COMPARISON:  None. FINDINGS: There is no evidence of fracture or dislocation. Hammertoe deformity of the digits. Scattered osteoarthritis throughout the midfoot, great toe, and digits. No evidence of inflammatory arthropathy. Bones appear under mineralized diffusely. No localizing soft tissue abnormality.  IMPRESSION: No acute bony abnormality. Scattered osteoarthritis. Hammertoe deformity of the digits. Electronically Signed   By: Jeb Levering M.D.   On: 04/24/2016 19:38   I have personally reviewed and evaluated these images and lab results as part of my medical  decision-making.   EKG Interpretation   Date/Time:  Saturday April 24 2016 18:04:17 EDT Ventricular Rate:  106 PR Interval:  158 QRS Duration: 72 QT Interval:  356 QTC Calculation: 472 R Axis:   71 Text Interpretation:  Sinus tachycardia Cannot rule out Anterior infarct ,  age undetermined Abnormal ECG nonspecific ST/T waves new since Nov 2016  Confirmed by Regenia Skeeter MD, [place] [phone]) on 04/24/2016 6:34:00 PM      MDM   Final diagnoses:  Syncope, unspecified syncope type    Unclear of the exact cause of the patient's syncope. Neuro exam unremarkable. Her ECG is nonspecific which is somewhat concerning given her intermittent shortness of breath. However there is no chest pain and initial troponin is negative, thus I think ACS is less likely. However her d-dimer is significantly elevated. She will need a V/Q scan given her worsening renal function. She will also be IV hydrated. Discussed with Dr. Tamala Julian who will admit for further workup and care. Left knee swelling appears to be chronic (for over 3 weeks) and is not red/hot and thus I think septic joint is highly unlikely    Sherwood Gambler, MD 04/24/16 2318  Sherwood Gambler, MD 04/24/16 [phone]

## 2016-04-25 ENCOUNTER — Observation Stay (HOSPITAL_COMMUNITY): Payer: Medicare Other

## 2016-04-25 DIAGNOSIS — I10 Essential (primary) hypertension: Secondary | ICD-10-CM

## 2016-04-25 DIAGNOSIS — Z720 Tobacco use: Secondary | ICD-10-CM | POA: Diagnosis present

## 2016-04-25 DIAGNOSIS — N179 Acute kidney failure, unspecified: Secondary | ICD-10-CM | POA: Diagnosis present

## 2016-04-25 DIAGNOSIS — R55 Syncope and collapse: Secondary | ICD-10-CM

## 2016-04-25 DIAGNOSIS — F411 Generalized anxiety disorder: Secondary | ICD-10-CM | POA: Diagnosis not present

## 2016-04-25 DIAGNOSIS — I959 Hypotension, unspecified: Secondary | ICD-10-CM | POA: Diagnosis present

## 2016-04-25 DIAGNOSIS — K921 Melena: Secondary | ICD-10-CM

## 2016-04-25 DIAGNOSIS — E785 Hyperlipidemia, unspecified: Secondary | ICD-10-CM | POA: Diagnosis present

## 2016-04-25 LAB — BASIC METABOLIC PANEL
Anion gap: 10 (ref 5–15)
BUN: 29 mg/dL — ABNORMAL HIGH (ref 6–20)
CO2: 16 mmol/L — ABNORMAL LOW (ref 22–32)
Calcium: 9.7 mg/dL (ref 8.9–10.3)
Chloride: 109 mmol/L (ref 101–111)
Creatinine, Ser: 1.6 mg/dL — ABNORMAL HIGH (ref 0.44–1.00)
GFR calc Af Amer: 36 mL/min — ABNORMAL LOW (ref >60)
GFR calc non Af Amer: 31 mL/min — ABNORMAL LOW (ref >60)
Glucose, Bld: 164 mg/dL — ABNORMAL HIGH (ref 65–99)
Potassium: 3.9 mmol/L (ref 3.5–5.1)
Sodium: 135 mmol/L (ref 135–145)

## 2016-04-25 LAB — CBC
HCT: 33.3 % — ABNORMAL LOW (ref 36.0–46.0)
Hemoglobin: 11 g/dL — ABNORMAL LOW (ref 12.0–15.0)
MCH: 28.5 pg (ref 26.0–34.0)
MCHC: 33 g/dL (ref 30.0–36.0)
MCV: 86.3 fL (ref 78.0–100.0)
Platelets: 527 10*3/uL — ABNORMAL HIGH (ref 150–400)
RBC: 3.86 MIL/uL — ABNORMAL LOW (ref 3.87–5.11)
RDW: 16.1 % — ABNORMAL HIGH (ref 11.5–15.5)
WBC: 8.6 10*3/uL (ref 4.0–10.5)

## 2016-04-25 LAB — HEMOGLOBIN AND HEMATOCRIT, BLOOD
HCT: 33.3 % — ABNORMAL LOW (ref 36.0–46.0)
HCT: 28.8 % — ABNORMAL LOW (ref 36.0–46.0)
Hemoglobin: 11 g/dL — ABNORMAL LOW (ref 12.0–15.0)
Hemoglobin: 9.4 g/dL — ABNORMAL LOW (ref 12.0–15.0)

## 2016-04-25 LAB — TYPE AND SCREEN
ABO/RH(D): O POS
Antibody Screen: NEGATIVE

## 2016-04-25 LAB — OCCULT BLOOD, POC DEVICE: Fecal Occult Bld: POSITIVE — AB

## 2016-04-25 LAB — TSH: TSH: 0.69 u[IU]/mL (ref 0.350–4.500)

## 2016-04-25 MED ORDER — TECHNETIUM TO 99M ALBUMIN AGGREGATED
4.3300 | Freq: Once | INTRAVENOUS | Status: AC | PRN
  Administered 2016-04-25: 4 via INTRAVENOUS

## 2016-04-25 MED ORDER — SODIUM CHLORIDE 0.9 % IV SOLN
8.0000 mg/h | INTRAVENOUS | Status: DC
  Administered 2016-04-25: 8 mg/h via INTRAVENOUS
  Filled 2016-04-25 (×4): qty 80

## 2016-04-25 MED ORDER — NICOTINE 21 MG/24HR TD PT24
21.0000 mg | MEDICATED_PATCH | Freq: Every day | TRANSDERMAL | Status: DC
  Administered 2016-04-25 – 2016-04-26 (×2): 21 mg via TRANSDERMAL
  Filled 2016-04-25: qty 1

## 2016-04-25 MED ORDER — PANTOPRAZOLE SODIUM 40 MG IV SOLR
40.0000 mg | Freq: Two times a day (BID) | INTRAVENOUS | Status: DC
  Administered 2016-04-25 – 2016-04-26 (×2): 40 mg via INTRAVENOUS
  Filled 2016-04-25 (×2): qty 40

## 2016-04-25 MED ORDER — SODIUM CHLORIDE 0.9 % IV SOLN
80.0000 mg | Freq: Once | INTRAVENOUS | Status: AC
  Administered 2016-04-25: 80 mg via INTRAVENOUS
  Filled 2016-04-25: qty 80

## 2016-04-25 MED ORDER — IPRATROPIUM BROMIDE 0.02 % IN SOLN: 0.5000 mg | Freq: Four times a day (QID) | RESPIRATORY_TRACT | Status: DC | PRN

## 2016-04-25 MED ORDER — PANTOPRAZOLE SODIUM 40 MG IV SOLR: 40.0000 mg | Freq: Two times a day (BID) | INTRAVENOUS | Status: DC

## 2016-04-25 NOTE — ED Notes (Signed)
IV team responded to room 47 and immediately made her way to nuc medicine once she became aware that pt is off the floor.  Nuclear medicine notified

## 2016-04-25 NOTE — ED Notes (Signed)
Pt is back from radiology, will transport up to [place] as soon as possible, no distress at this time

## 2016-04-25 NOTE — ED Notes (Signed)
Call from nuclear med, IV SL infiltrated.

## 2016-04-25 NOTE — Progress Notes (Addendum)
PROGRESS NOTE  Alyssa [name]  [id number] DOB: 05-16-1941 DOA: 04/24/2016 PCP: [name], NP Outpatient Specialists:  Subjective: Feels okay this morning, denies any complaints.  Brief Narrative:  75 year old female with past mental history of echo views, acute renal failure and hypertension came in with syncope and collapse  Assessment & Plan:   Principal Problem:   Syncope and collapse Active Problems:   Melena   Acute kidney injury ([place])   Essential hypertension   Anxiety state   Tobacco abuse   Hyperlipidemia   Syncope: Acute -Patient reports standing up and walking from the restroom and she syncopized.  -Elevated d-dimer's at 2.69, VQ scan done showed low probability. -Telemetry did not show any arrhythmias. -Orthostatic vitals negative for specific hypotension. -Normal TSH, 2-D echo pending, cannot rule out anemia as cause of syncope.  Melena/? Upper GI bleed -Positive FOBT, mild anemia with dark stools -Twice a day IV Protonix -Globin drop to 9.4 from around 11.5 I will ask GI to evaluate, she takes ibuprofen and Goody powder frequently.  Anemia -Has chronic anemia secondary to CKD stage III, likely exacerbated by acute blood loss anemia. -Transfuse if hemoglobin less than 8.  Acute kidney injury on CKD stage III -Cr 1.86 with a BUN 28. Previously baseline creatinine 1.3 approximately 1 month ago.  -Suspect possibility of a prerenal source. -Started on IV fluids, check BMP in a.m., educated patient about NSAIDs use.  Essential hypertension - Held patient's home combination pill of amlodipine-valsartan-HCTZ - Continue amlodipine and valsartan  Anxiety - Continue buspirone and clonazepam  Tobacco abuse: Patient notes that she currently continues to smoke but would like to try and quit. She would like to at least try the nicotine patch while here in the hospital. - Nicotine patch  - Counseled the patient on a near cessation of  tobacco  Hyperlipidemia  - Continue Crestor   Thrombocytosis -Platelet count elevated at 567. This could be secondary to patient's iron deficiency anemia and iron supplementation. - Continue to monitor repeat CBC in a.m.  Spinal stenosis -Has progressive severe foraminal stenosis at L4-L5 causing gait problems but this should not cause loss of consciousness.   DVT prophylaxis:  Code Status: Full Code Family Communication:  Disposition Plan:  Diet: Diet clear liquid Room service appropriate?: Yes; Fluid consistency:: Thin  Consultants:   None  Procedures:   None  Antimicrobials:   None   Objective: Filed Vitals:   04/24/16 2245 04/25/16 0122 04/25/16 0201 04/25/16 0250  BP: 138/90 152/92 155/101   Pulse: 91 87 88   Temp:  98.7 F (37.1 C) 97.9 F (36.6 C)   TempSrc:  Oral Oral   Resp: 18 16 16    Height:   5\' 3"  (1.6 m)   Weight:   43.591 kg (96 lb 1.6 oz)   SpO2: 99% 99% 100% 100%    Intake/Output Summary (Last 24 hours) at 04/25/16 1244 Last data filed at 04/25/16 0900  Gross per 24 hour  Intake 700.42 ml  Output    600 ml  Net 100.42 ml   Filed Weights   04/24/16 1802 04/25/16 0201  Weight: 45.36 kg (100 lb) 43.591 kg (96 lb 1.6 oz)    Examination: General exam: Appears calm and comfortable  Respiratory system: Clear to auscultation. Respiratory effort normal. Cardiovascular system: S1 & S2 heard, RRR. No JVD, murmurs, rubs, gallops or clicks. No pedal edema. Gastrointestinal system: Abdomen is nondistended, soft and nontender. No organomegaly or masses felt. Normal  bowel sounds heard. Central nervous system: Alert and oriented. No focal neurological deficits. Extremities: Symmetric 5 x 5 power. Skin: No rashes, lesions or ulcers Psychiatry: Judgement and insight appear normal. Mood & affect appropriate.   Data Reviewed: I have personally reviewed following labs and imaging studies  CBC:  Recent Labs Lab 04/24/16 1826 04/25/16 0224  04/25/16 0228 04/25/16 0902  WBC 9.6  --  8.6  --   HGB 11.7* 11.0* 11.0* 9.4*  HCT 35.1* 33.3* 33.3* 28.8*  MCV 85.4  --  86.3  --   PLT 567*  --  527*  --    Basic Metabolic Panel:  Recent Labs Lab 04/24/16 1826 04/25/16 0228  NA 138 135  K 3.9 3.9  CL 110 109  CO2 15* 16*  GLUCOSE 116* 164*  BUN 28* 29*  CREATININE 1.86* 1.60*  CALCIUM 10.2 9.7   GFR: Estimated Creatinine Clearance: 21.9 mL/min (by C-G formula based on Cr of 1.6). Liver Function Tests: No results for input(s): AST, ALT, ALKPHOS, BILITOT, PROT, ALBUMIN in the last 168 hours. No results for input(s): LIPASE, AMYLASE in the last 168 hours. No results for input(s): AMMONIA in the last 168 hours. Coagulation Profile: No results for input(s): INR, PROTIME in the last 168 hours. Cardiac Enzymes:  Recent Labs Lab 04/24/16 1940  TROPONINI <0.03   BNP (last 3 results) No results for input(s): PROBNP in the last 8760 hours. HbA1C: No results for input(s): HGBA1C in the last 72 hours. CBG: No results for input(s): GLUCAP in the last 168 hours. Lipid Profile: No results for input(s): CHOL, HDL, LDLCALC, TRIG, CHOLHDL, LDLDIRECT in the last 72 hours. Thyroid Function Tests:  Recent Labs  04/25/16 0228  TSH 0.690   Anemia Panel: No results for input(s): VITAMINB12, FOLATE, FERRITIN, TIBC, IRON, RETICCTPCT in the last 72 hours. Urine analysis:    Component Value Date/Time   COLORURINE YELLOW 04/24/2016 1858   APPEARANCEUR CLOUDY* 04/24/2016 1858   LABSPEC 1.021 04/24/2016 1858   PHURINE 5.5 04/24/2016 1858   GLUCOSEU NEGATIVE 04/24/2016 1858   HGBUR NEGATIVE 04/24/2016 1858   BILIRUBINUR NEGATIVE 04/24/2016 1858   KETONESUR NEGATIVE 04/24/2016 1858   PROTEINUR 100* 04/24/2016 1858   UROBILINOGEN 0.2 08/24/2015 1737   NITRITE NEGATIVE 04/24/2016 1858   LEUKOCYTESUR NEGATIVE 04/24/2016 1858   No results found for this or any previous visit (from the past 240 hour(s)).   Invalid input(s):  PROCALCITONIN, LACTICACIDVEN   Radiology Studies: Dg Chest 2 View  04/24/2016  CLINICAL DATA:  75 year old female with history of syncope and dyspnea for 1 day. EXAM: CHEST  2 VIEW COMPARISON:  Chest x-ray 08/24/2015. FINDINGS: Emphysematous changes are noted throughout the lungs bilaterally. Lung volumes appear slightly increased. No consolidative airspace disease. No pleural effusions. No pneumothorax. Prominent bilateral nipple shadows incidentally noted. No pulmonary nodule or mass noted. Pulmonary vasculature and the cardiomediastinal silhouette are within normal limits. Atherosclerosis in the thoracic aorta. IMPRESSION: 1. No radiographic evidence of acute cardiopulmonary disease. 2. Aortic atherosclerosis. 3. Emphysema. Electronically Signed   By: Vinnie Langton M.D.   On: 04/24/2016 19:37   Nm Pulmonary Perf And Vent  04/25/2016  CLINICAL DATA:  Acute onset of dyspnea and syncope. Shortness of breath on exertion. Nausea. Initial encounter. EXAM: NUCLEAR MEDICINE VENTILATION - PERFUSION LUNG SCAN TECHNIQUE: Ventilation images were obtained in multiple projections using inhaled aerosol Tc-74m DTPA. Perfusion images were obtained in multiple projections after intravenous injection of Tc-16m MAA. RADIOPHARMACEUTICALS:  32.3 mCi Technetium-65m DTPA aerosol inhalation  and 4.33 mCi Technetium-33m MAA IV COMPARISON:  Chest radiograph performed earlier today at 7:08 p.m. FINDINGS: Ventilation: Mildly heterogeneous ventilation is noted within both lungs, without significant focal ventilation defect. Perfusion: No wedge shaped peripheral perfusion defects to suggest acute pulmonary embolism. IMPRESSION: Normal scan. No evidence for pulmonary embolus. Mildly heterogeneous ventilation within both lungs. Electronically Signed   By: Garald Balding M.D.   On: 04/25/2016 01:19   Dg Knee Complete 4 Views Left  04/24/2016  CLINICAL DATA:  Syncope and dyspnea for 1 day. Medial left knee pain. EXAM: LEFT KNEE -  COMPLETE 4+ VIEW COMPARISON:  None. FINDINGS: Chondrocalcinosis is identified in the lateral and medial compartments. Tricompartmental degenerative changes are seen as well in all 3 compartments. There is a small suprapatellar joint effusion. No acute fractures are seen. IMPRESSION: Chondrocalcinosis consistent with CPPD/pseudogout. Severe tricompartmental degenerative changes. Small suprapatellar joint effusion. Electronically Signed   By: Dorise Bullion III M.D   On: 04/24/2016 19:38   Dg Foot Complete Left  04/24/2016  CLINICAL DATA:  Medial left foot pain for 3 days. Distal foot and toe pain. EXAM: LEFT FOOT - COMPLETE 3+ VIEW COMPARISON:  None. FINDINGS: There is no evidence of fracture or dislocation. Hammertoe deformity of the digits. Scattered osteoarthritis throughout the midfoot, great toe, and digits. No evidence of inflammatory arthropathy. Bones appear under mineralized diffusely. No localizing soft tissue abnormality. IMPRESSION: No acute bony abnormality. Scattered osteoarthritis. Hammertoe deformity of the digits. Electronically Signed   By: Jeb Levering M.D.   On: 04/24/2016 19:38   Scheduled Meds: . busPIRone  15 mg Oral Daily  . citalopram  40 mg Oral QPC lunch  . ferrous sulfate  325 mg Oral Q breakfast  . gabapentin  100 mg Oral BID  . multivitamin with minerals  1 tablet Oral Daily  . nicotine  21 mg Transdermal Daily  . [START ON 04/28/2016] pantoprazole  40 mg Intravenous Q12H  . rosuvastatin  10 mg Oral Daily  . sodium chloride flush  3 mL Intravenous Q12H  . zolpidem  5 mg Oral QHS   Continuous Infusions: . sodium chloride 75 mL/hr at 04/25/16 0240  . pantoprozole (PROTONIX) infusion 8 mg/hr (04/25/16 0311)        Time spent: 35 minutes    Abbegayle Denault A, MD [facility] Pager [phone]  If 7PM-7AM, please contact night-coverage [email or web address] Password [email or web address] 04/25/2016, 12:44 PM

## 2016-04-26 DIAGNOSIS — N179 Acute kidney failure, unspecified: Secondary | ICD-10-CM | POA: Diagnosis not present

## 2016-04-26 DIAGNOSIS — F411 Generalized anxiety disorder: Secondary | ICD-10-CM | POA: Diagnosis not present

## 2016-04-26 DIAGNOSIS — R55 Syncope and collapse: Secondary | ICD-10-CM | POA: Diagnosis not present

## 2016-04-26 DIAGNOSIS — I10 Essential (primary) hypertension: Secondary | ICD-10-CM | POA: Diagnosis not present

## 2016-04-26 LAB — CBC
HCT: 27.3 % — ABNORMAL LOW (ref 36.0–46.0)
Hemoglobin: 9 g/dL — ABNORMAL LOW (ref 12.0–15.0)
MCH: 27.9 pg (ref 26.0–34.0)
MCHC: 33 g/dL (ref 30.0–36.0)
MCV: 84.5 fL (ref 78.0–100.0)
Platelets: 425 10*3/uL — ABNORMAL HIGH (ref 150–400)
RBC: 3.23 MIL/uL — ABNORMAL LOW (ref 3.87–5.11)
RDW: 16 % — ABNORMAL HIGH (ref 11.5–15.5)
WBC: 5.4 10*3/uL (ref 4.0–10.5)

## 2016-04-26 LAB — BASIC METABOLIC PANEL
Anion gap: 6 (ref 5–15)
BUN: 21 mg/dL — ABNORMAL HIGH (ref 6–20)
CO2: 17 mmol/L — ABNORMAL LOW (ref 22–32)
Calcium: 9.1 mg/dL (ref 8.9–10.3)
Chloride: 114 mmol/L — ABNORMAL HIGH (ref 101–111)
Creatinine, Ser: 1.23 mg/dL — ABNORMAL HIGH (ref 0.44–1.00)
GFR calc Af Amer: 50 mL/min — ABNORMAL LOW (ref >60)
GFR calc non Af Amer: 43 mL/min — ABNORMAL LOW (ref >60)
Glucose, Bld: 91 mg/dL (ref 65–99)
Potassium: 5.1 mmol/L (ref 3.5–5.1)
Sodium: 137 mmol/L (ref 135–145)

## 2016-04-26 MED ORDER — ACETAMINOPHEN 325 MG PO TABS: 650.0000 mg | ORAL_TABLET | Freq: Four times a day (QID) | ORAL | Status: DC | PRN

## 2016-04-26 MED ORDER — PANTOPRAZOLE SODIUM 40 MG PO TBEC: 40.0000 mg | DELAYED_RELEASE_TABLET | Freq: Two times a day (BID) | ORAL | Status: DC

## 2016-04-26 NOTE — Progress Notes (Signed)
Spoke with Dr. Hartford Poli regarding discharge and he requested follow-up appointment for 1-2 weeks with [facility] and stated Dr. Cristina Gong relayed yesterday that Ms. Stooksbury saw Dr. Watt Climes in the past. Appointment in process.

## 2016-04-26 NOTE — Discharge Summary (Signed)
Physician Discharge Summary  Alyssa [name] [id number] DOB: Sep 23, 1941 DOA: 04/24/2016  PCP: [name], NP  Admit date: 04/24/2016 Discharge date: 04/26/2016  Admitted From: Home Disposition: Home  Recommendations for Outpatient Follow-up:  1. Follow up with PCP and [facility] in 1-2 weeks, recommend echo to be done as outpatient 2. Please obtain BMP/CBC in one week  Home Health: No Equipment/Devices: None  Discharge Condition: Stable CODE STATUS: Full code Diet recommendation: Heart Healthy  Brief/Interim Summary: Alyssa [name] is a 75 y.o. female with medical history significant of HTN, HLD, PVD, anxiety, and tobacco abuse who presents after passing out at home. She recalls waking up this morning she and she had gotten out of bed to go make herself some breakfast. She felt lightheaded and passed out. She does not recall how long she was out, but believes it was only for a few seconds or so. She was able to get up, and denies sustaining any significant injuries. Since that time she's had associated symptoms of shortness of breath, decreased appetite, nausea, and continued lightheadedness/dizziness. She reports that normally had weighed around 93 pounds, but has been weighting around 100 lbs. Shortness of breath symptoms are only when ambulating or exerting herself and not at rest. Reports that she's had shortness of breathe and passed out before in the past, but only can recall that she was told that symptoms were related to her blood sugars being low. Patient is not on any diabetic medications. Denies any significant weight loss, recent travel, sedentary lifestyle, calf pain, chest pain, diaphoresis, or vomiting. Patient notes that she continues to smoke although she would like to quit. Stools are dark as she is on ferrous sulfate. She does state regular use of ibuprofen and aspirin anywhere from 2-3 times per week and sometimes 2 times in a day as needed for joint  pains and headaches.  ED Course: Upon admission to the emergency department patient was evaluated and seen to be afebrile, pulse of 101, respirations of 20, and blood pressure/ O2 saturations maintained on room air. Lab work revealed WBC 9.6, hemoglobin 11.7, platelets 567, sodium 138, potassium 3.9, chloride 110, CO2 15, BUN 20, creatinine 1.86, calcium 10.2, troponin <0.03, and glucose 160. D-dimer elevated at 2.69. Urinalysis. To be negative for any signs of infection. Imaging showed no acute signs of fracture or infiltrate. CT angiogram could not be performed secondary to patient's kidney function therefore VQ scan was ordered. [facility] called to admit  Discharge Diagnoses:  Principal Problem:   Syncope and collapse Active Problems:   Melena   Acute kidney injury ([place])   Essential hypertension   Anxiety state   Tobacco abuse   Hyperlipidemia   Syncope: Acute -Patient reports standing up and walking from the restroom and she syncopized.  -Elevated d-dimer's at 2.69, VQ scan done showed low probability. -Telemetry did not show any arrhythmias. One set of cardiac enzymes negative, denies any chest pain. -Orthostatic vitals negative for specific hypotension. -Normal TSH, VQ scan is negative, MRI of the brain is negative except for right maxillary air sinus mucosal thickening. -At the time of discharge to the echo was not done, I recommended this to be done as outpatient as soon as possible.  Melena/? Upper GI bleed -Positive FOBT, mild anemia with dark stools -Hemoglobin dropped from 11.7 to 9.0, no overt bleeding but she has dark stools. -This is discussed with Dr. Cristina Gong of [facility], he recommended that she needs EGD/colonoscopy as outpatient ASAP. -Patient to  follow-up with [facility] in 1-2 weeks. Currently hemoglobin is stable.  Anemia -Has chronic anemia secondary to CKD stage III, likely exacerbated by acute blood loss anemia. -Hemoglobin is stable at 9 on discharge.  Acute  kidney injury on CKD stage III -Cr 1.86 with a BUN 28. Previously baseline creatinine 1.3 approximately 1 month ago.  -Suspect possibility of a prerenal source. -Started on IV fluids, check BMP in a.m., educated patient about NSAIDs use. -This is back to baseline at 1.2.  Essential hypertension -Held patient's home combination pill of amlodipine-valsartan-HCTZ -Continue amlodipine and valsartan  Anxiety - Continue buspirone and clonazepam  Tobacco abuse: Patient notes that she currently continues to smoke but would like to try and quit. She would like to at least try the nicotine patch while here in the hospital. -Nicotine patch  -Counseled the patient on a near cessation of tobacco  Hyperlipidemia  - Continue Crestor   Thrombocytosis -Platelet count elevated at 567. This could be secondary to patient's iron deficiency anemia and iron supplementation. - Continue to monitor repeat CBC in a.m.  Spinal stenosis -Has progressive severe foraminal stenosis at L4-L5 causing gait problems but this should not cause loss of consciousness.  Discharge Instructions  Discharge Instructions    Diet - low sodium heart healthy    Complete by:  As directed      Increase activity slowly    Complete by:  As directed             Medication List    STOP taking these medications        aspirin-acetaminophen-caffeine 250-250-65 MG tablet  Commonly known as:  EXCEDRIN MIGRAINE     GOODYS BODY PAIN PO     ibuprofen 200 MG tablet  Commonly known as:  ADVIL,MOTRIN      TAKE these medications        acetaminophen 325 MG tablet  Commonly known as:  TYLENOL  Take 2 tablets (650 mg total) by mouth every 6 (six) hours as needed for mild pain (or Fever >/= 101).     Amlodipine-Valsartan-HCTZ 10-320-25 MG Tabs  Take 1 tablet by mouth daily.     busPIRone 15 MG tablet  Commonly known as:  BUSPAR  Take 15 mg by mouth daily.     citalopram 40 MG tablet  Commonly known as:  CELEXA   Take 1 tablet (40 mg total) by mouth daily.     clonazePAM 1 MG tablet  Commonly known as:  KLONOPIN  Take 1 mg by mouth at bedtime.     diphenhydrAMINE 25 mg capsule  Commonly known as:  BENADRYL  Take 1 capsule (25 mg total) by mouth every 6 (six) hours as needed for itching.     ferrous sulfate 325 (65 FE) MG tablet  Take 325 mg by mouth daily with breakfast.     gabapentin 100 MG capsule  Commonly known as:  NEURONTIN  Take 100 mg by mouth 2 (two) times daily.     HYDROcodone-acetaminophen 5-325 MG tablet  Commonly known as:  NORCO/VICODIN  Take 1-2 tablets by mouth every 6 (six) hours as needed for moderate pain or severe pain.     LORazepam 1 MG tablet  Commonly known as:  ATIVAN  Take 0.5 tablets (0.5 mg total) by mouth at bedtime. Take as prescribed. Do NOT take greater or more frequently then prescribed. Do NOT take with other sedating medications or ANY alcohol as this can result in death. This medication can impair  coordination and reflexes, and cause drowsiness. Do NOT perform tasks in which this would place you in danger as it can make you a FALL RISK.     multivitamin with minerals Tabs tablet  Take 1 tablet by mouth daily. One a Day Women's     Oxycodone HCl 10 MG Tabs  Take 1 tablet (10 mg total) by mouth 2 (two) times daily as needed (pain).     pantoprazole 40 MG tablet  Commonly known as:  PROTONIX  Take 1 tablet (40 mg total) by mouth 2 (two) times daily.     polyethylene glycol packet  Commonly known as:  MIRALAX / GLYCOLAX  Take 17 g by mouth daily.     rosuvastatin 10 MG tablet  Commonly known as:  CRESTOR  Take 10 mg by mouth daily.     traZODone 50 MG tablet  Commonly known as:  DESYREL  Take 1 tablet (50 mg total) by mouth at bedtime as needed for sleep.        No Known Allergies  Consultations:  None   Procedures/Studies: Dg Chest 2 View  04/24/2016  CLINICAL DATA:  75 year old female with history of syncope and dyspnea for 1 day.  EXAM: CHEST  2 VIEW COMPARISON:  Chest x-ray 08/24/2015. FINDINGS: Emphysematous changes are noted throughout the lungs bilaterally. Lung volumes appear slightly increased. No consolidative airspace disease. No pleural effusions. No pneumothorax. Prominent bilateral nipple shadows incidentally noted. No pulmonary nodule or mass noted. Pulmonary vasculature and the cardiomediastinal silhouette are within normal limits. Atherosclerosis in the thoracic aorta. IMPRESSION: 1. No radiographic evidence of acute cardiopulmonary disease. 2. Aortic atherosclerosis. 3. Emphysema. Electronically Signed   By: Vinnie Langton M.D.   On: 04/24/2016 19:37   Mr Brain Wo Contrast  04/25/2016  CLINICAL DATA:  75 year old female presents after syncope at home. Shortness of breath on exertion recently. Initial encounter. EXAM: MRI HEAD WITHOUT CONTRAST TECHNIQUE: Multiplanar, multiecho pulse sequences of the brain and surrounding structures were obtained without intravenous contrast. COMPARISON:  Head CT without contrast 03/26/2016 and earlier. FINDINGS: Cerebral volume is within normal limits for age. No restricted diffusion to suggest acute infarction. No midline shift, mass effect, evidence of mass lesion, ventriculomegaly, extra-axial collection or acute intracranial hemorrhage. Cervicomedullary junction and pituitary are within normal limits. Major intracranial vascular flow voids are preserved. Scattered and patchy bilateral cerebral white matter T2 and FLAIR hyperintensity, the configuration is nonspecific the extent is moderate for age. No cortical encephalomalacia or chronic cerebral blood products. T2 heterogeneity in the left basal ganglia. Other deep gray matter nuclei, brainstem, and cerebellum are within normal limits. Trace fluid in the inferior mastoids with otherwise negative visualized internal auditory structures. Significance is doubtful. There is mild to moderate mucosal thickening in the right sphenoid and  maxillary sinuses. Evidence of prior paranasal sinus surgery including ethmoidectomy on the right. Postoperative changes to both globes. Otherwise negative orbit and scalp soft tissues. Exaggerated cervical spine lordosis. Normal bone marrow signal. IMPRESSION: 1.  No acute intracranial abnormality. 2. Moderate for age nonspecific signal changes in the brain, most commonly due to chronic small vessel disease. 3. Previous paranasal sinus surgery, mild to moderate sinus mucosal thickening on the right. Electronically Signed   By: Genevie Ann M.D.   On: 04/25/2016 15:06   Nm Pulmonary Perf And Vent  04/25/2016  CLINICAL DATA:  Acute onset of dyspnea and syncope. Shortness of breath on exertion. Nausea. Initial encounter. EXAM: NUCLEAR MEDICINE VENTILATION - PERFUSION LUNG SCAN TECHNIQUE:  Ventilation images were obtained in multiple projections using inhaled aerosol Tc-80m DTPA. Perfusion images were obtained in multiple projections after intravenous injection of Tc-34m MAA. RADIOPHARMACEUTICALS:  32.3 mCi Technetium-53m DTPA aerosol inhalation and 4.33 mCi Technetium-79m MAA IV COMPARISON:  Chest radiograph performed earlier today at 7:08 p.m. FINDINGS: Ventilation: Mildly heterogeneous ventilation is noted within both lungs, without significant focal ventilation defect. Perfusion: No wedge shaped peripheral perfusion defects to suggest acute pulmonary embolism. IMPRESSION: Normal scan. No evidence for pulmonary embolus. Mildly heterogeneous ventilation within both lungs. Electronically Signed   By: Garald Balding M.D.   On: 04/25/2016 01:19   Dg Knee Complete 4 Views Left  04/24/2016  CLINICAL DATA:  Syncope and dyspnea for 1 day. Medial left knee pain. EXAM: LEFT KNEE - COMPLETE 4+ VIEW COMPARISON:  None. FINDINGS: Chondrocalcinosis is identified in the lateral and medial compartments. Tricompartmental degenerative changes are seen as well in all 3 compartments. There is a small suprapatellar joint effusion. No acute  fractures are seen. IMPRESSION: Chondrocalcinosis consistent with CPPD/pseudogout. Severe tricompartmental degenerative changes. Small suprapatellar joint effusion. Electronically Signed   By: Dorise Bullion III M.D   On: 04/24/2016 19:38   Dg Foot Complete Left  04/24/2016  CLINICAL DATA:  Medial left foot pain for 3 days. Distal foot and toe pain. EXAM: LEFT FOOT - COMPLETE 3+ VIEW COMPARISON:  None. FINDINGS: There is no evidence of fracture or dislocation. Hammertoe deformity of the digits. Scattered osteoarthritis throughout the midfoot, great toe, and digits. No evidence of inflammatory arthropathy. Bones appear under mineralized diffusely. No localizing soft tissue abnormality. IMPRESSION: No acute bony abnormality. Scattered osteoarthritis. Hammertoe deformity of the digits. Electronically Signed   By: Jeb Levering M.D.   On: 04/24/2016 19:38    (Echo, Carotid, EGD, Colonoscopy, ERCP)    Subjective:   Discharge Exam: Filed Vitals:   04/26/16 0400 04/26/16 1158  BP: 162/84 144/85  Pulse: 81 83  Temp: 98.1 F (36.7 C) 97.8 F (36.6 C)  Resp: 18 16   Filed Vitals:   04/25/16 2014 04/26/16 0027 04/26/16 0400 04/26/16 1158  BP: 145/84 148/82 162/84 144/85  Pulse: 86 82 81 83  Temp: 98.1 F (36.7 C) 97.9 F (36.6 C) 98.1 F (36.7 C) 97.8 F (36.6 C)  TempSrc: Oral Oral Oral Oral  Resp: 18 18 18 16   Height:      Weight:   43.636 kg (96 lb 3.2 oz)   SpO2: 100% 100% 100%     General: Pt is alert, awake, not in acute distress Cardiovascular: RRR, S1/S2 +, no rubs, no gallops Respiratory: CTA bilaterally, no wheezing, no rhonchi Abdominal: Soft, NT, ND, bowel sounds + Extremities: no edema, no cyanosis    The results of significant diagnostics from this hospitalization (including imaging, microbiology, ancillary and laboratory) are listed below for reference.     Microbiology: No results found for this or any previous visit (from the past 240 hour(s)).    Labs: BNP (last 3 results) No results for input(s): BNP in the last 8760 hours. Basic Metabolic Panel:  Recent Labs Lab 04/24/16 1826 04/25/16 0228 04/26/16 0321  NA 138 135 137  K 3.9 3.9 5.1  CL 110 109 114*  CO2 15* 16* 17*  GLUCOSE 116* 164* 91  BUN 28* 29* 21*  CREATININE 1.86* 1.60* 1.23*  CALCIUM 10.2 9.7 9.1   Liver Function Tests: No results for input(s): AST, ALT, ALKPHOS, BILITOT, PROT, ALBUMIN in the last 168 hours. No results for input(s): LIPASE, AMYLASE  in the last 168 hours. No results for input(s): AMMONIA in the last 168 hours. CBC:  Recent Labs Lab 04/24/16 1826 04/25/16 0224 04/25/16 0228 04/25/16 0902 04/26/16 0321  WBC 9.6  --  8.6  --  5.4  HGB 11.7* 11.0* 11.0* 9.4* 9.0*  HCT 35.1* 33.3* 33.3* 28.8* 27.3*  MCV 85.4  --  86.3  --  84.5  PLT 567*  --  527*  --  425*   Cardiac Enzymes:  Recent Labs Lab 04/24/16 1940  TROPONINI <0.03   BNP: Invalid input(s): POCBNP CBG: No results for input(s): GLUCAP in the last 168 hours. D-Dimer  Recent Labs  04/24/16 1940  DDIMER 2.69*   Hgb A1c No results for input(s): HGBA1C in the last 72 hours. Lipid Profile No results for input(s): CHOL, HDL, LDLCALC, TRIG, CHOLHDL, LDLDIRECT in the last 72 hours. Thyroid function studies  Recent Labs  04/25/16 0228  TSH 0.690   Anemia work up No results for input(s): VITAMINB12, FOLATE, FERRITIN, TIBC, IRON, RETICCTPCT in the last 72 hours. Urinalysis    Component Value Date/Time   COLORURINE YELLOW 04/24/2016 1858   APPEARANCEUR CLOUDY* 04/24/2016 1858   LABSPEC 1.021 04/24/2016 1858   PHURINE 5.5 04/24/2016 1858   GLUCOSEU NEGATIVE 04/24/2016 1858   HGBUR NEGATIVE 04/24/2016 1858   BILIRUBINUR NEGATIVE 04/24/2016 1858   KETONESUR NEGATIVE 04/24/2016 1858   PROTEINUR 100* 04/24/2016 1858   UROBILINOGEN 0.2 08/24/2015 1737   NITRITE NEGATIVE 04/24/2016 1858   LEUKOCYTESUR NEGATIVE 04/24/2016 1858   Sepsis Labs Invalid input(s):  PROCALCITONIN,  WBC,  LACTICIDVEN Microbiology No results found for this or any previous visit (from the past 240 hour(s)).   Time coordinating discharge: Over 30 minutes  SIGNED:   Birdie Hopes, MD  [facility] 04/26/2016, 1:15 PM Pager   If 7PM-7AM, please contact night-coverage [email or web address] Password [email or web address]

## 2016-05-04 ENCOUNTER — Ambulatory Visit: Payer: Medicare Other | Admitting: Neurology

## 2016-05-04 ENCOUNTER — Telehealth: Payer: Self-pay | Admitting: *Deleted

## 2016-05-04 NOTE — Telephone Encounter (Signed)
No showed follow up appointment. 

## 2016-05-05 ENCOUNTER — Encounter: Payer: Self-pay | Admitting: Neurology

## 2016-05-11 ENCOUNTER — Other Ambulatory Visit: Payer: Self-pay | Admitting: Neurosurgery

## 2016-05-11 DIAGNOSIS — M4316 Spondylolisthesis, lumbar region: Secondary | ICD-10-CM

## 2016-05-17 ENCOUNTER — Other Ambulatory Visit: Payer: Self-pay

## 2016-05-17 ENCOUNTER — Ambulatory Visit
Admission: RE | Admit: 2016-05-17 | Discharge: 2016-05-17 | Disposition: A | Discharge status: Home / Self Care | Payer: Medicare Other | Source: Ambulatory Visit | Attending: Neurosurgery | Admitting: Neurosurgery

## 2016-05-17 DIAGNOSIS — M4316 Spondylolisthesis, lumbar region: Secondary | ICD-10-CM

## 2016-05-31 ENCOUNTER — Ambulatory Visit (INDEPENDENT_AMBULATORY_CARE_PROVIDER_SITE_OTHER): Payer: Medicare Other | Admitting: Neurology

## 2016-05-31 ENCOUNTER — Encounter: Payer: Self-pay | Admitting: Neurology

## 2016-05-31 VITALS — BP 180/93 | HR 65 | Ht 63.0 in | Wt 95.8 lb

## 2016-05-31 DIAGNOSIS — M21372 Foot drop, left foot: Secondary | ICD-10-CM | POA: Diagnosis not present

## 2016-05-31 DIAGNOSIS — M4316 Spondylolisthesis, lumbar region: Secondary | ICD-10-CM | POA: Diagnosis not present

## 2016-05-31 DIAGNOSIS — R269 Unspecified abnormalities of gait and mobility: Secondary | ICD-10-CM

## 2016-05-31 MED ORDER — CELECOXIB 100 MG PO CAPS: 100.0000 mg | ORAL_CAPSULE | Freq: Two times a day (BID) | ORAL | 11 refills | Status: DC | PRN
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
## 2016-07-12 IMAGING — DX DG LUMBAR SPINE COMPLETE 4+V
5 series · 5 of 5 positions shown · non-contrast
Comparison: None.

CLINICAL DATA: Bilateral lower extremity pain for 1 week

EXAM:
LUMBAR SPINE - COMPLETE 4+ VIEW

[l-spine ap]
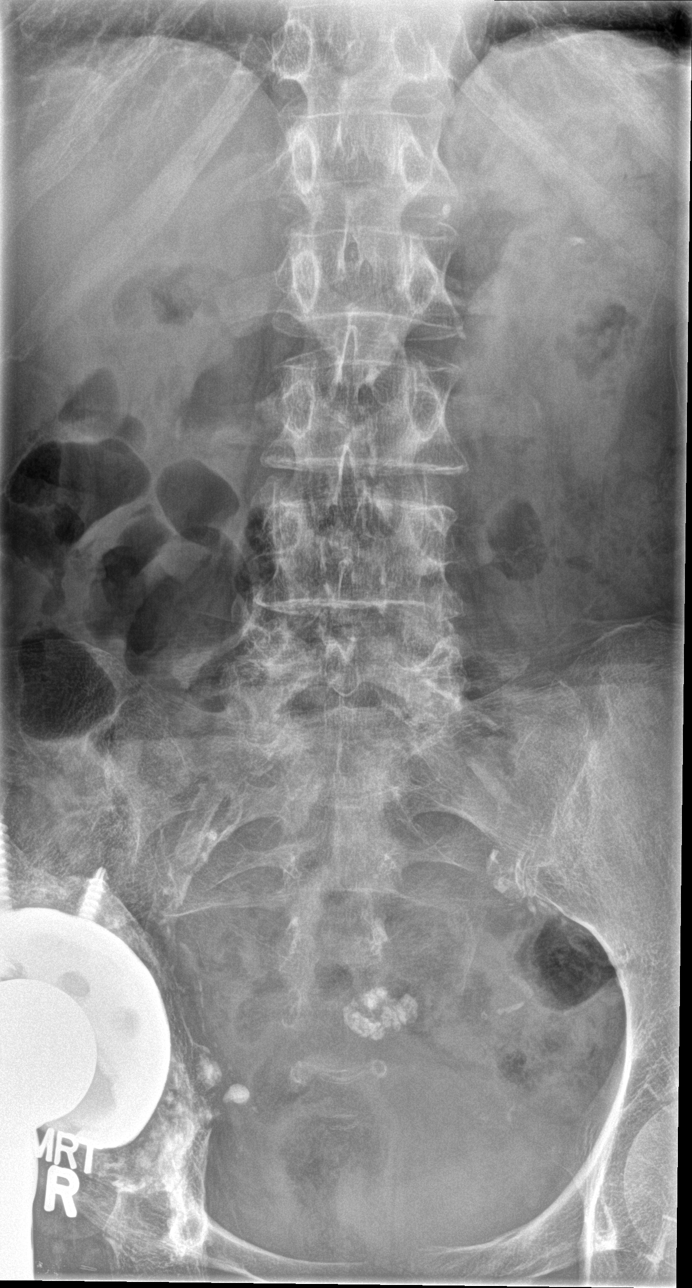

[l-spine obl (1 of 2)]
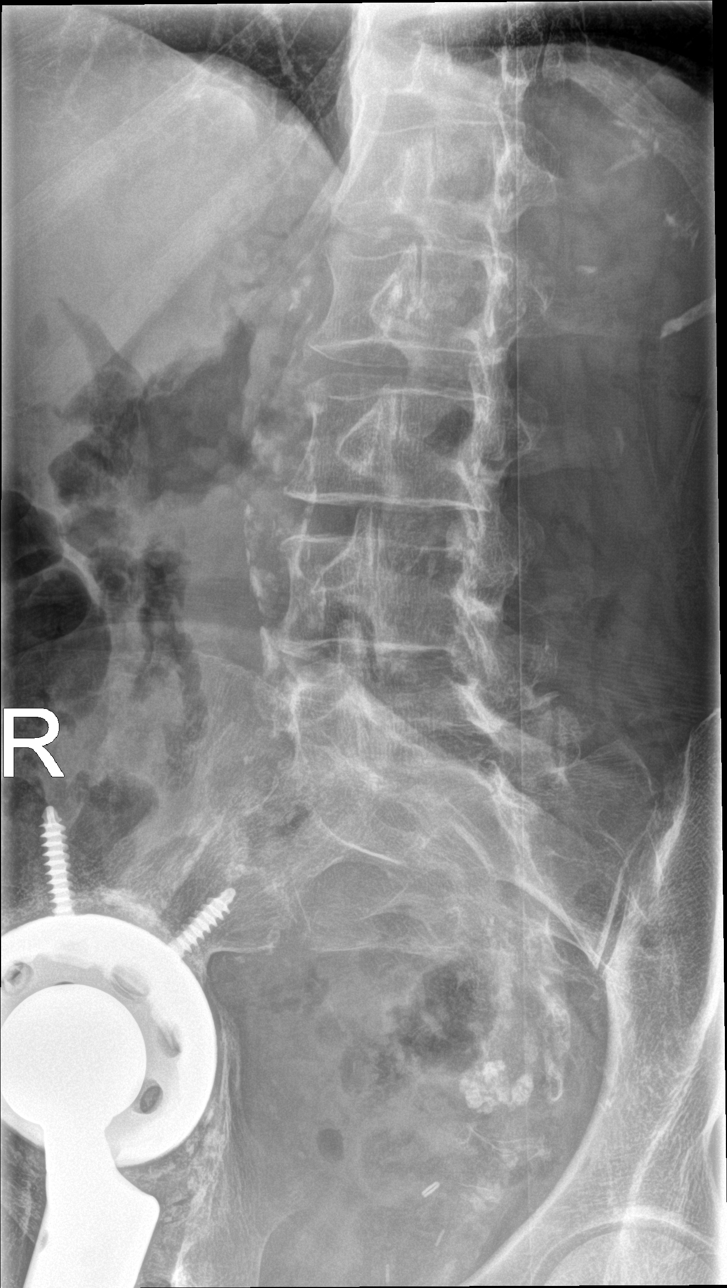

[l-spine obl (2 of 2)]
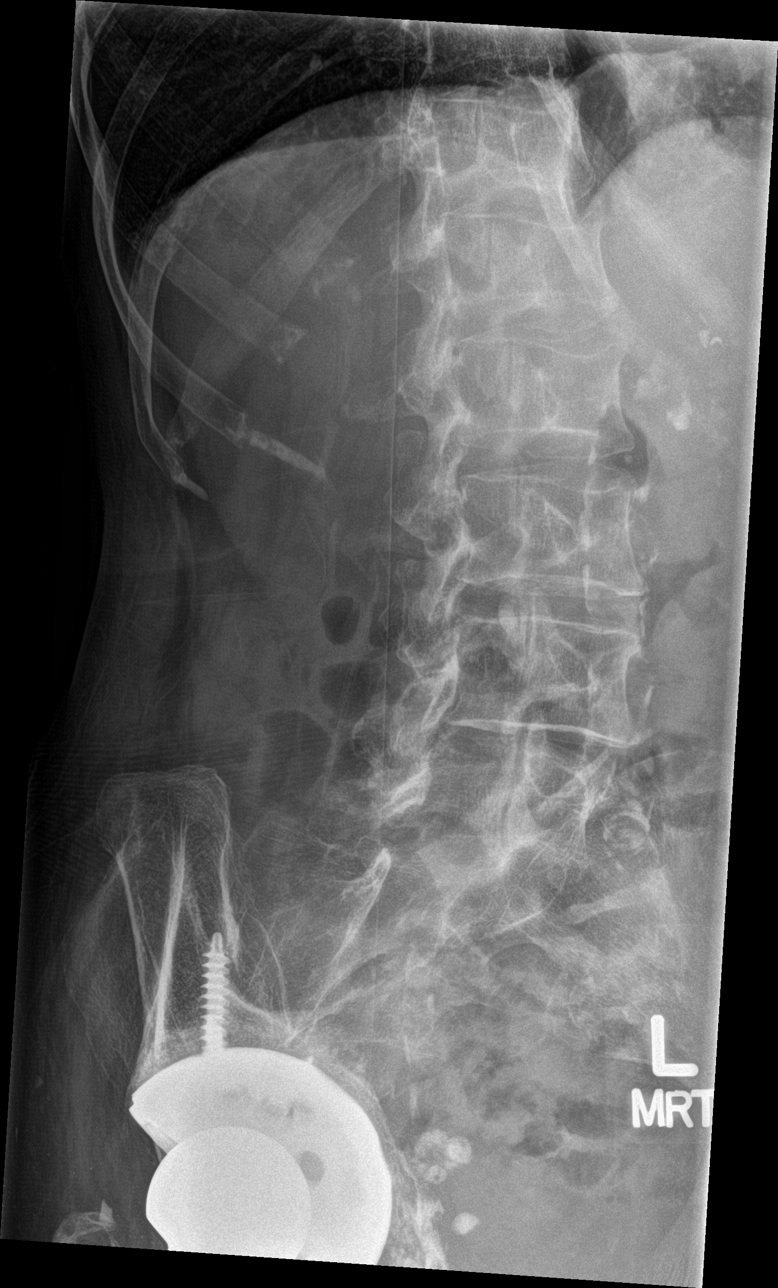

[l-spine lat]
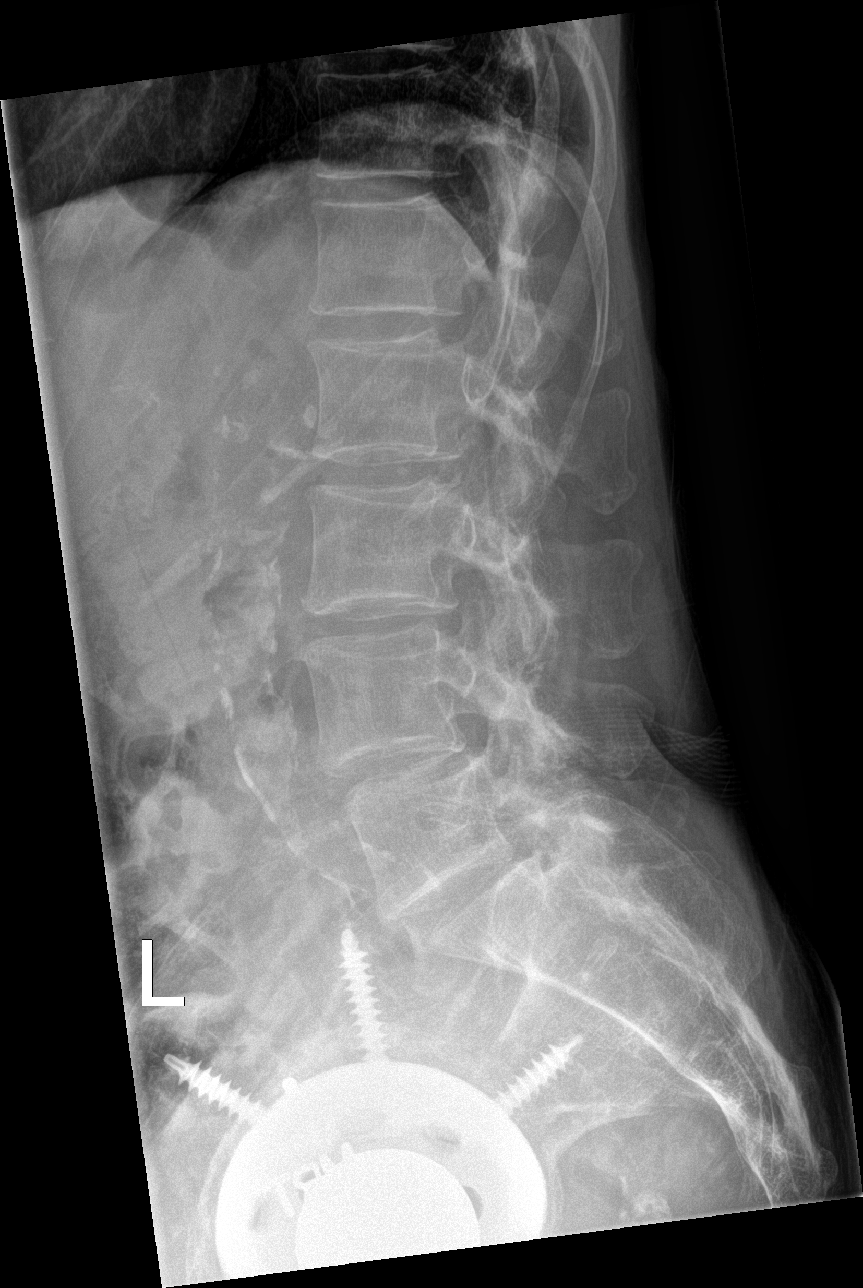

[l-spine spot]
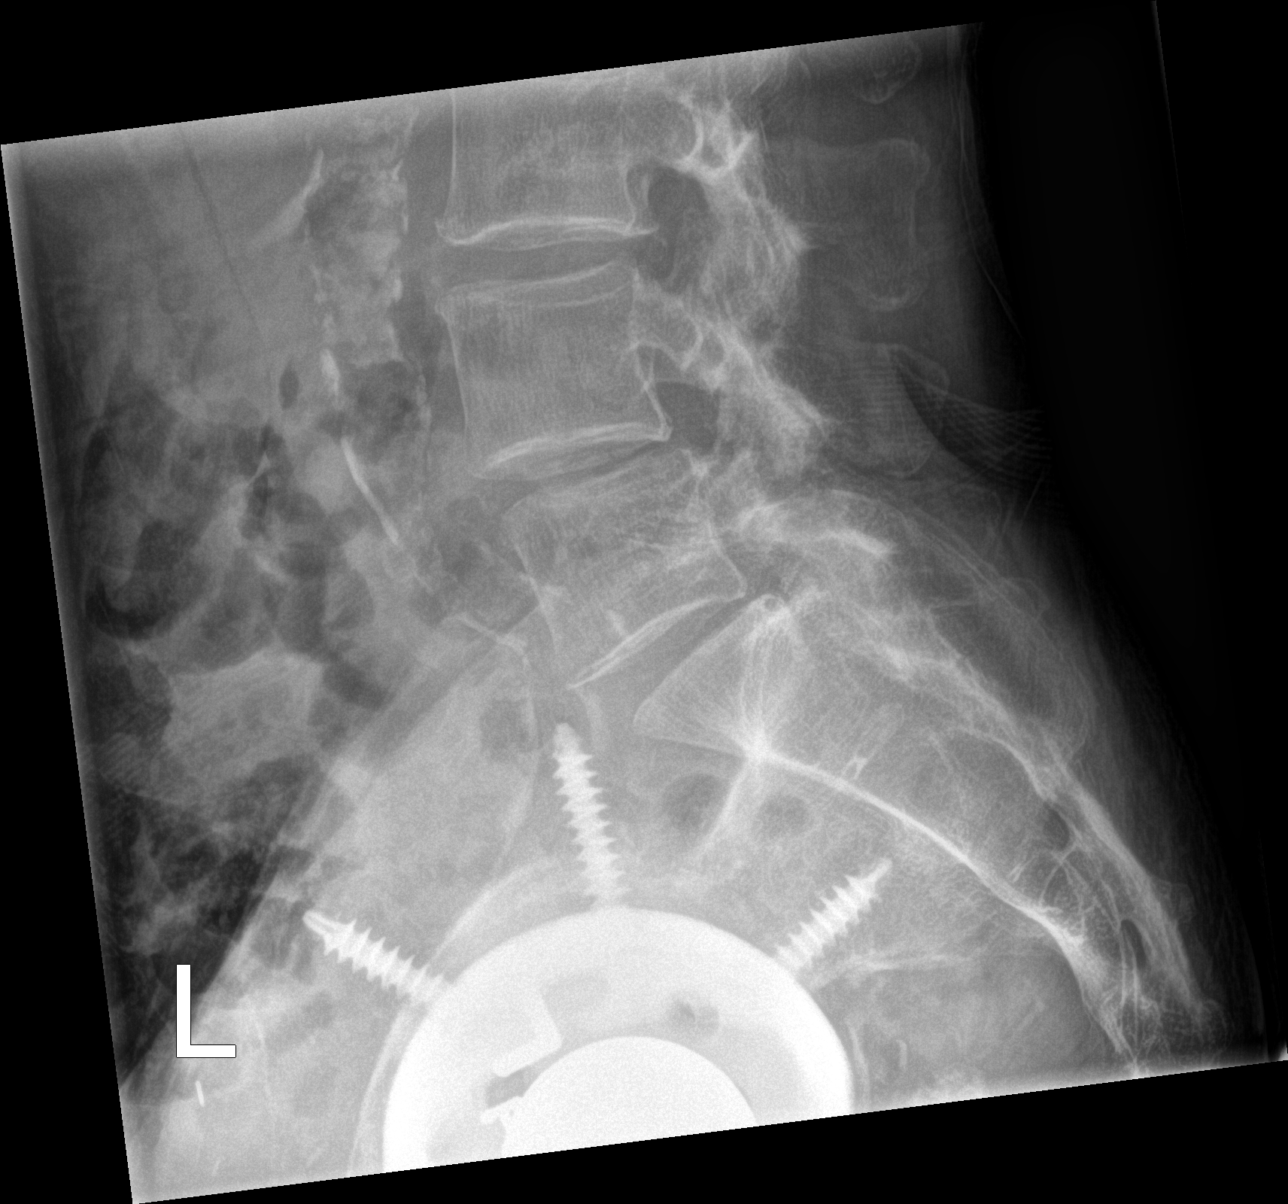

[5 of 5 positions shown; findings below may reference images not displayed]

FINDINGS: There is no evidence of lumbar spine fracture. Minimal
anterolisthesis of L4 on L5 and L5 on S1 secondary to bilateral
facet arthropathy. Disc spaces are relatively well maintained.
Generalized osteopenia.

Right total hip arthroplasty.

Abdominal aortic atherosclerosis.
IMPRESSION: 1. Minimal anterolisthesis of L4 on L5 and L5 on S1 secondary to
bilateral facet arthropathy.

## 2016-07-25 ENCOUNTER — Encounter (HOSPITAL_COMMUNITY): Payer: Self-pay | Admitting: Family Medicine

## 2016-07-25 ENCOUNTER — Ambulatory Visit (HOSPITAL_COMMUNITY)
Admission: EM | Admit: 2016-07-25 | Discharge: 2016-07-25 | Disposition: A | Discharge status: Home / Self Care | Payer: Medicare HMO | Attending: Internal Medicine | Admitting: Internal Medicine

## 2016-07-25 DIAGNOSIS — F17210 Nicotine dependence, cigarettes, uncomplicated: Secondary | ICD-10-CM | POA: Diagnosis not present

## 2016-07-25 DIAGNOSIS — I10 Essential (primary) hypertension: Secondary | ICD-10-CM | POA: Insufficient documentation

## 2016-07-25 DIAGNOSIS — G629 Polyneuropathy, unspecified: Secondary | ICD-10-CM | POA: Insufficient documentation

## 2016-07-25 DIAGNOSIS — F411 Generalized anxiety disorder: Secondary | ICD-10-CM | POA: Insufficient documentation

## 2016-07-25 DIAGNOSIS — M130 Polyarthritis, unspecified: Secondary | ICD-10-CM | POA: Insufficient documentation

## 2016-07-25 DIAGNOSIS — E785 Hyperlipidemia, unspecified: Secondary | ICD-10-CM | POA: Diagnosis not present

## 2016-07-25 DIAGNOSIS — M1990 Unspecified osteoarthritis, unspecified site: Secondary | ICD-10-CM | POA: Insufficient documentation

## 2016-07-25 DIAGNOSIS — F332 Major depressive disorder, recurrent severe without psychotic features: Secondary | ICD-10-CM | POA: Insufficient documentation

## 2016-07-25 DIAGNOSIS — M2550 Pain in unspecified joint: Secondary | ICD-10-CM | POA: Diagnosis present

## 2016-07-25 LAB — SEDIMENTATION RATE: Sed Rate: 50 mm/hr — ABNORMAL HIGH (ref 0–22)

## 2016-07-25 MED ORDER — PREDNISONE 10 MG PO TABS: 20.0000 mg | ORAL_TABLET | Freq: Every day | ORAL | 0 refills | Status: DC

## 2016-07-25 NOTE — ED Triage Notes (Signed)
Pt here for all over joint pain x 3 days.

## 2016-07-25 NOTE — ED Provider Notes (Signed)
[facility]    CSN: [id number] Arrival date & time: 07/25/16  1206     History   Chief Complaint Chief Complaint  Patient presents with  . Joint Pain    HPI Alyssa [name] is a 75 y.o. female. She presents today with 3 day history of achiness all over, most pronounced in the neck and shoulders, hard to lift both arms over her head. Trauma, no new activities. Some discomfort in hips, also in wrists ankles knees. No fever, no rash. No nausea/vomiting. Has intermittent diarrhea, not new. History of a leg length discrepancy.  History of gout. No vision change, no difficulty with chewing. Hasn't been able to sleep for the last 3 nights, wakes up hurting. Taking OTC ibuprofen 600 mg 4 times a day, without significant relief.  HPI  Past Medical History:  Diagnosis Date  . Anxiety   . Arthritis   . Headache(784.0)   . Hyperlipidemia   . Hypertension   . Joint pain   . Leg pain   . Peripheral neuropathy ([place])   . Peripheral vascular disease [facility])     Patient Active Problem List   Diagnosis Date Noted  . Melena 04/25/2016  . Acute kidney injury ([place]) 04/25/2016  . Essential hypertension 04/25/2016  . Anxiety state 04/25/2016  . Tobacco abuse 04/25/2016  . Hyperlipidemia 04/25/2016  . Syncope 04/24/2016  . Syncope and collapse 04/24/2016  . Gait difficulty 01/12/2016  . Foot drop, left 01/12/2016  . Severe recurrent major depression without psychotic features ([place]) 08/28/2015  . Spondylolisthesis at L4-L5 level 07/30/2015  . PVD (peripheral vascular disease) with claudication ([place]) 07/29/2014  . Weakness-Bilateral arm/leg 07/29/2014  . Numbness-left leg 07/29/2014  . Swelling of limb-Legs 07/29/2014  . Atherosclerosis of native arteries of the extremities with intermittent claudication 09/30/2011    Past Surgical History:  Procedure Laterality Date  . ABDOMINAL ANGIOGRAM N/A 10/29/2011   Procedure: ABDOMINAL ANGIOGRAM;  Surgeon: Elam Dutch, MD;   Location: [facility] CATH LAB;  Service: Cardiovascular;  Laterality: N/A;  . BACK SURGERY    . FEMORAL-FEMORAL BYPASS GRAFT  08/31/2010  . JOINT REPLACEMENT Right 2012   Hip  . TOTAL HIP ARTHROPLASTY     right    OB History    No data available       Home Medications    Prior to Admission medications   Medication Sig Start Date End Date Taking? Authorizing Provider  acetaminophen (TYLENOL) 325 MG tablet Take 2 tablets (650 mg total) by mouth every 6 (six) hours as needed for mild pain (or Fever >/= 101). 04/26/16   Verlee Monte, MD  Amlodipine-Valsartan-HCTZ 63-149-70 MG TABS Take 1 tablet by mouth daily.    Historical Provider, MD  busPIRone (BUSPAR) 15 MG tablet Take 15 mg by mouth daily.  12/31/15   Historical Provider, MD  celecoxib (CELEBREX) 100 MG capsule Take 1 capsule (100 mg total) by mouth 2 (two) times daily as needed. 05/31/16   Marcial Pacas, MD  citalopram (CELEXA) 40 MG tablet Take 1 tablet (40 mg total) by mouth daily. Patient taking differently: Take 40 mg by mouth daily after lunch.  08/30/15   Delfin Gant, NP  clonazePAM (KLONOPIN) 1 MG tablet Take 1 mg by mouth at bedtime. 02/26/16   Historical Provider, MD  diphenhydrAMINE (BENADRYL) 25 mg capsule Take 1 capsule (25 mg total) by mouth every 6 (six) hours as needed for itching. 12/28/15   Varney Biles, MD  ferrous sulfate 325 (65 FE)  MG tablet Take 325 mg by mouth daily with breakfast.    Historical Provider, MD  gabapentin (NEURONTIN) 100 MG capsule Take 100 mg by mouth 2 (two) times daily.  11/07/09   Historical Provider, MD  LORazepam (ATIVAN) 1 MG tablet Take 0.5 tablets (0.5 mg total) by mouth at bedtime. Take as prescribed. Do NOT take greater or more frequently then prescribed. Do NOT take with other sedating medications or ANY alcohol as this can result in death. This medication can impair coordination and reflexes, and cause drowsiness. Do NOT perform tasks in which this would place you in danger as it can make you  a FALL RISK. 08/27/15   Voncille Lo, MD  Multiple Vitamin (MULTIVITAMIN WITH MINERALS) TABS tablet Take 1 tablet by mouth daily. One a Day Women's    Historical Provider, MD  pantoprazole (PROTONIX) 40 MG tablet Take 1 tablet (40 mg total) by mouth 2 (two) times daily. 04/26/16   Verlee Monte, MD  polyethylene glycol (MIRALAX / GLYCOLAX) packet Take 17 g by mouth daily. Patient taking differently: Take 17 g by mouth daily as needed for mild constipation. Mix in 8 oz liquid and drink 08/30/15   Delfin Gant, NP  predniSONE (DELTASONE) 10 MG tablet Take 2 tablets (20 mg total) by mouth daily. 07/25/16   Sherlene Shams, MD  rosuvastatin (CRESTOR) 10 MG tablet Take 10 mg by mouth daily.     Historical Provider, MD  traZODone (DESYREL) 50 MG tablet Take 1 tablet (50 mg total) by mouth at bedtime as needed for sleep. 08/30/15   Delfin Gant, NP    Family History Family History  Problem Relation Age of Onset  . Heart attack Mother   . Heart disease Mother   . Hyperlipidemia Mother   . Hypertension Mother   . Heart attack Father   . Heart disease Father     Before age 34  . Hyperlipidemia Father   . Hypertension Father   . Cancer Sister   . Aneurysm Brother   . Cancer Brother     Social History Social History  Substance Use Topics  . Smoking status: Current Every Day Smoker    Packs/day: 0.50    Types: Cigarettes    Last attempt to quit: 01/27/2014  . Smokeless tobacco: Never Used  . Alcohol use No     Allergies   Review of patient's allergies indicates no known allergies.   Review of Systems Review of Systems  All other systems reviewed and are negative.    Physical Exam Triage Vital Signs ED Triage Vitals  Enc Vitals Group     BP 07/25/16 1310 190/98     Pulse Rate 07/25/16 1310 89     Resp 07/25/16 1310 20     Temp 07/25/16 1310 98.1 F (36.7 C)     Temp Source 07/25/16 1310 Oral     SpO2 07/25/16 1310 100 %     Weight --      Height --      Pain  Score 07/25/16 1344 9   Updated Vital Signs BP 190/98 (BP Location: Right Arm)   Pulse 89   Temp 98.1 F (36.7 C) (Oral)   Resp 20   SpO2 100%  Physical Exam  Constitutional: She is oriented to person, place, and time. No distress.  Alert, nicely groomed  HENT:  Head: Atraumatic.  Eyes:  Conjugate gaze, no eye redness/drainage  Neck: Neck supple.  Cardiovascular: Normal rate and regular rhythm.  Pulmonary/Chest: No respiratory distress. She has no wheezes. She has no rales.  Lungs clear, symmetric breath sounds  Abdominal: She exhibits no distension.  Musculoskeletal: Normal range of motion.  No leg swelling Difficulty raising both arms overhead due to pain, right shoulder greater than left shoulder Able to stand from a chair with difficulty, moves stiffly at the hips.  Neurological: She is alert and oriented to person, place, and time.  Skin: Skin is warm and dry.  No cyanosis  Nursing note and vitals reviewed.    UC Treatments / Results  Labs (all labs ordered are listed, but only abnormal results are displayed) Labs Reviewed  SEDIMENTATION RATE - Abnormal; Notable for the following:       Result Value   Sed Rate 50 (*)    All other components within normal limits     Procedures Procedures (including critical care time)      None today  Final Clinical Impressions(s) / UC Diagnoses   Final diagnoses:  Polyarthropathy or polyarthritis of multiple sites   Cause of multiple joint pains today is unclear.  A sed rate (blood test) to look for evidence of polymyalgia rheumatica was done today.  Prescription for low dose of prednisone was sent to the [facility] on [address].  Also possible that the achiness is from an early flu-like illness.  Followup with primary care provider Everardo Beals in the next week to discuss further.  New Prescriptions New Prescriptions   PREDNISONE (DELTASONE) 10 MG TABLET    Take 2 tablets (20 mg total) by mouth daily.     Sherlene Shams, MD 08/05/16 [phone]

## 2016-07-25 NOTE — Discharge Instructions (Addendum)
Cause of multiple joint pains today is unclear.  A sed rate (blood test) to look for evidence of polymyalgia rheumatica was done today.  Prescription for low dose of prednisone was sent to the [facility] on [address].  Also possible that the achiness is from an early flu-like illness.  Followup with primary care provider Everardo Beals in the next week to discuss further.

## 2016-07-29 ENCOUNTER — Telehealth (HOSPITAL_COMMUNITY): Payer: Self-pay | Admitting: Emergency Medicine

## 2016-07-29 NOTE — Telephone Encounter (Signed)
LM w/Alyssa [name] Need to see how pt is doing and to give lab results from recent visit on 10/8 Also let pt know labs can be obtained from [place]

## 2016-07-29 NOTE — Telephone Encounter (Signed)
-----   Message from Sherlene Shams, MD sent at 07/29/2016  3:24 PM EDT ----- Please see how her neck/shoulder pain is doing.   Her sed rate was elevated, which is sometimes a sign of an inflammatory process like rheumatoid or lupus, that should be evaluated by a rheumatologist.  LM

## 2016-08-04 NOTE — Telephone Encounter (Signed)
-----   Message from Sherlene Shams, MD sent at 07/29/2016  3:24 PM EDT ----- Please see how her neck/shoulder pain is doing.   Her sed rate was elevated, which is sometimes a sign of an inflammatory process like rheumatoid or lupus, that should be evaluated by a rheumatologist.  LM

## 2016-08-04 NOTE — Telephone Encounter (Signed)
LM w/William Skeet Simmer... Reports pt is sleeping.  Need to give lab results and to see how pt is doing from recent visit on 10/8 Notified pt in gen message to call back if not feeling any better, not tolerating meds well or if wanting to know lab results. Also let pt know labs can be obtained from [place]

## 2016-08-31 ENCOUNTER — Ambulatory Visit (INDEPENDENT_AMBULATORY_CARE_PROVIDER_SITE_OTHER): Payer: Medicare HMO | Admitting: Neurology

## 2016-08-31 ENCOUNTER — Encounter: Payer: Self-pay | Admitting: Neurology

## 2016-08-31 VITALS — BP 176/102 | HR 99 | Ht 63.0 in | Wt 96.8 lb

## 2016-08-31 DIAGNOSIS — M21372 Foot drop, left foot: Secondary | ICD-10-CM | POA: Diagnosis not present

## 2016-08-31 DIAGNOSIS — R269 Unspecified abnormalities of gait and mobility: Secondary | ICD-10-CM | POA: Diagnosis not present

## 2016-08-31 DIAGNOSIS — M4316 Spondylolisthesis, lumbar region: Secondary | ICD-10-CM | POA: Diagnosis not present

## 2016-08-31 DIAGNOSIS — R200 Anesthesia of skin: Secondary | ICD-10-CM

## 2016-08-31 MED ORDER — GABAPENTIN 100 MG PO CAPS: 100.0000 mg | ORAL_CAPSULE | Freq: Three times a day (TID) | ORAL | 11 refills | Status: DC
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
## 2016-09-06 IMAGING — MR MR LUMBAR SPINE W/O CM
4 of 5 series · 19 of 48 positions shown · non-contrast
Comparison: MRI lumbar spine 04/18/2014

CLINICAL DATA: Low back pain extending into the legs bilaterally.
Progression a pain over the last 2 days.

EXAM:
MRI LUMBAR SPINE WITHOUT CONTRAST
TECHNIQUE: Multiplanar, multisequence MR imaging of the lumbar spine was
performed. No intravenous contrast was administered.

[Series 3: T2 · sagittal · 4.0mm · 0.55mm/px · 7 of 13 slices shown (1 of 2)]
[im 1/13]
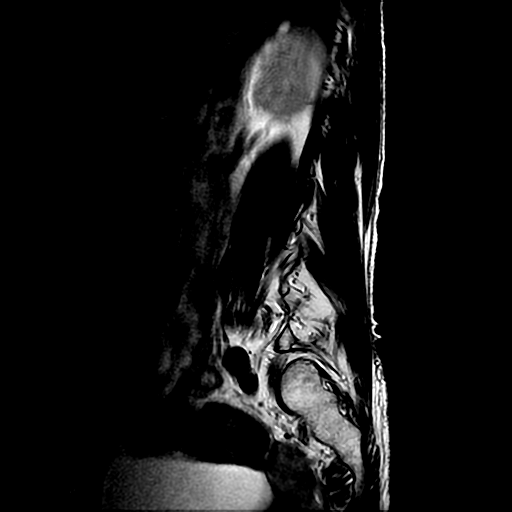
[im 3/13]
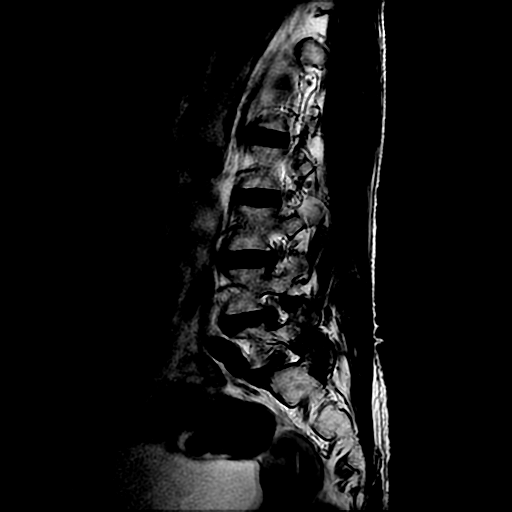
[im 5/13]
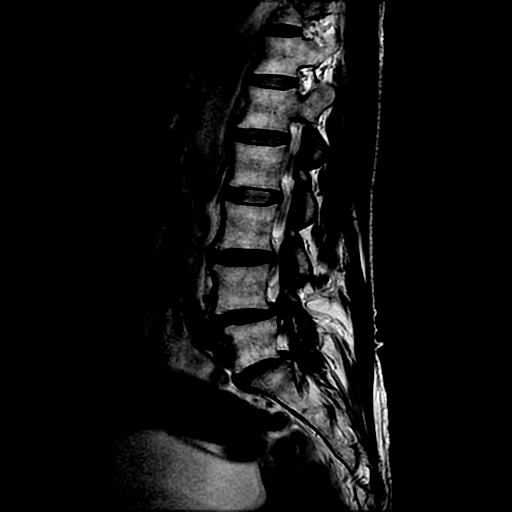
[im 7/13]
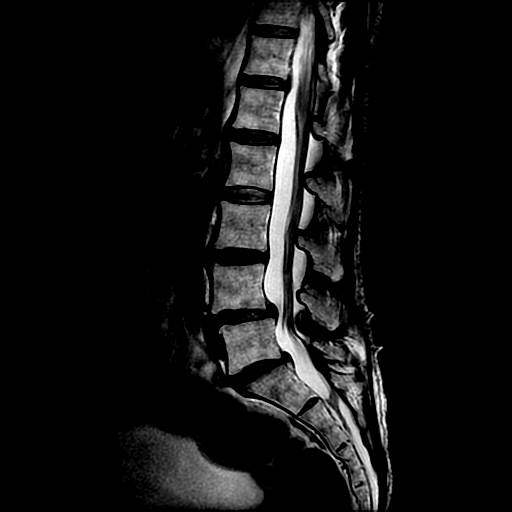
[im 9/13]
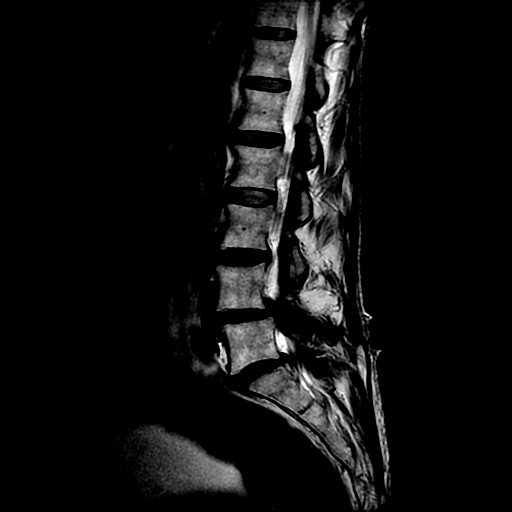
[im 11/13]
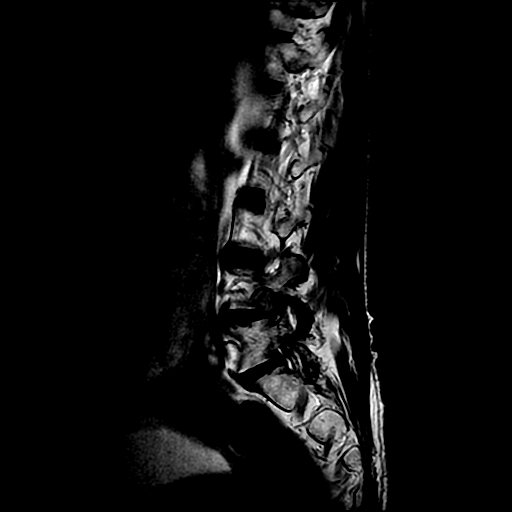
[im 13/13]
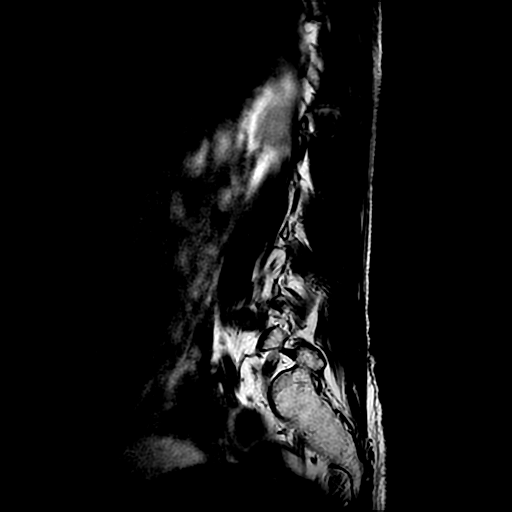

[Series 4: T1 · sagittal · 4.0mm · 0.55mm/px · 3 of 13 slices shown (1 of 2)]
[im 3/13]
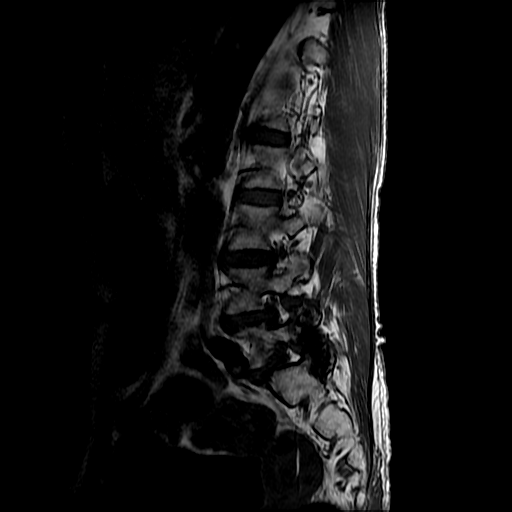
[im 7/13]
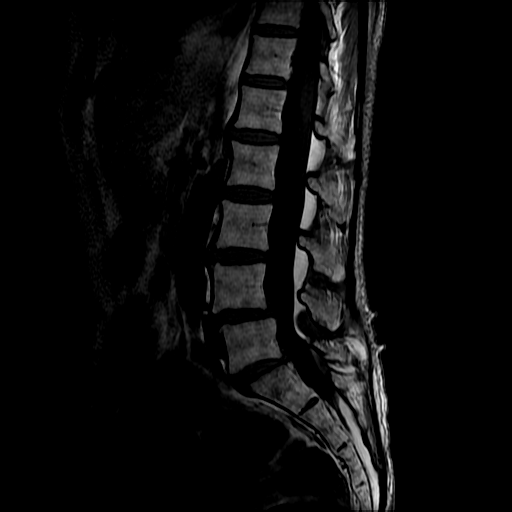
[im 11/13]
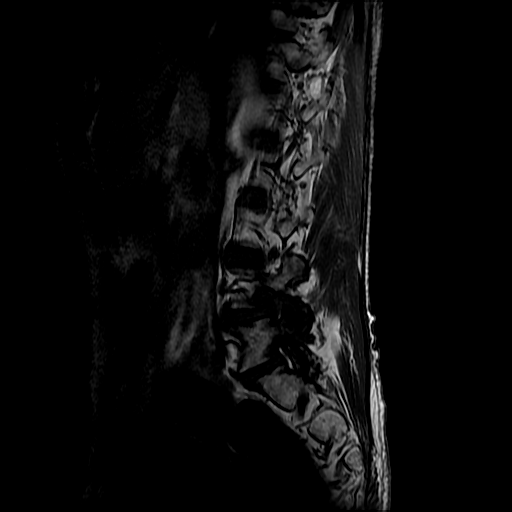

[Series 6: T2 · axial · 4.0mm · 0.39mm/px · z∈[-89,+56]mm · 6 of 29 slices shown (2 of 2)]
[im 1/29]
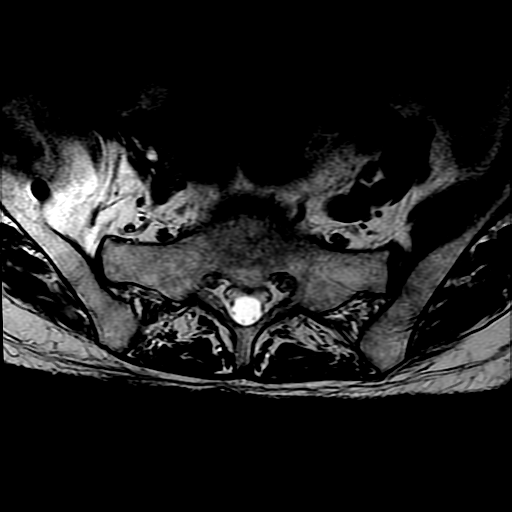
[im 5/29]
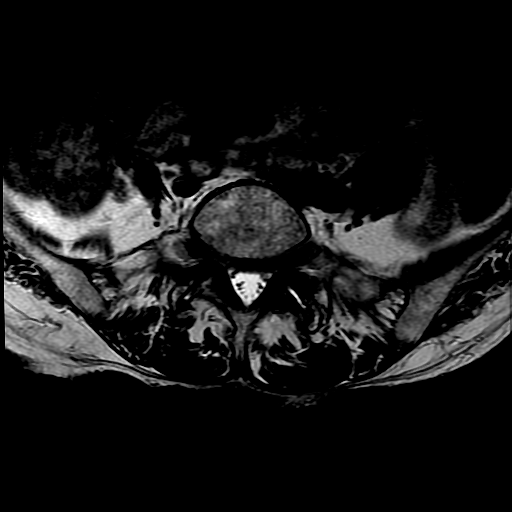
[im 9/29]
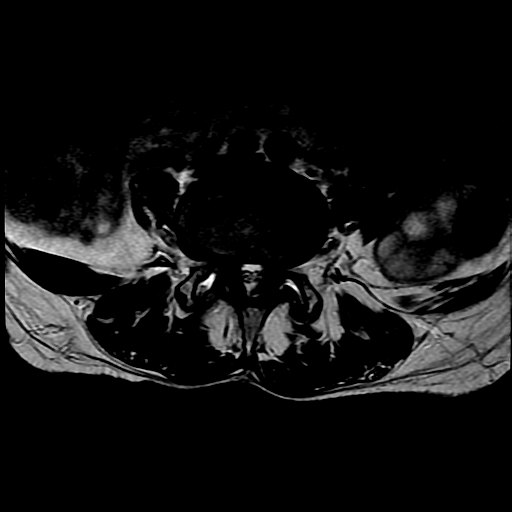
[im 13/29]
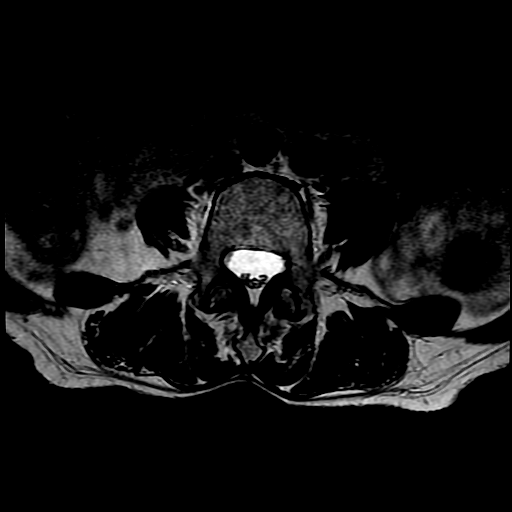
[im 16/29]
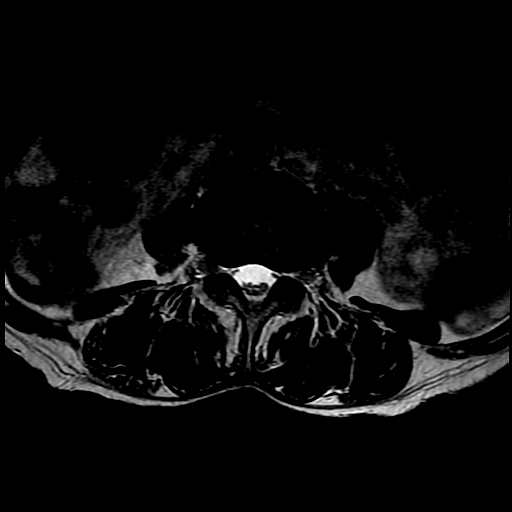
[im 24/29]
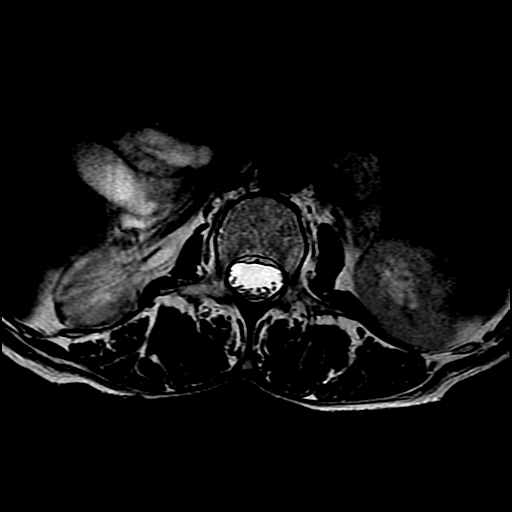

[Series 7: T1 · axial · 4.0mm · 0.39mm/px · z∈[-69,+56]mm · 3 of 29 slices shown (2 of 2)]
[im 5/29]
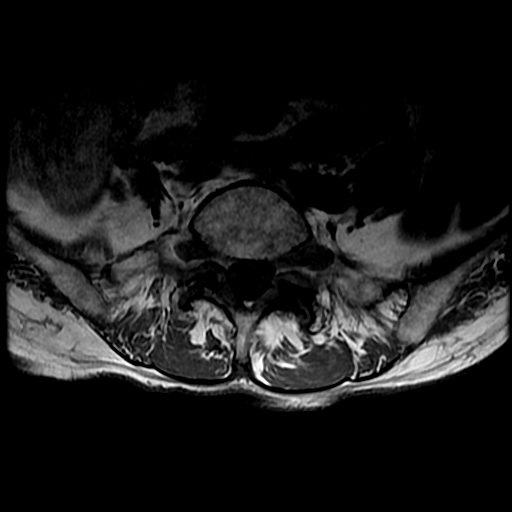
[im 16/29]
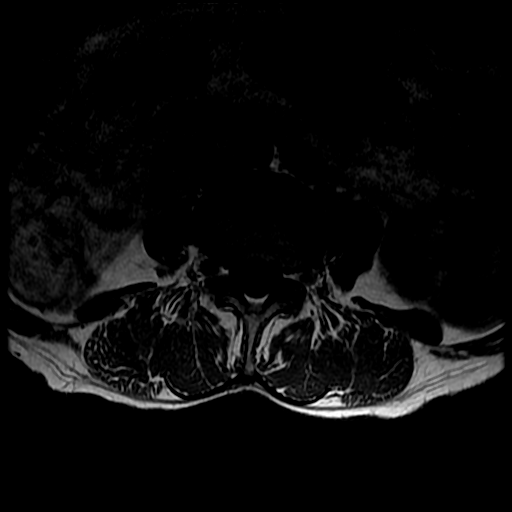
[im 24/29]
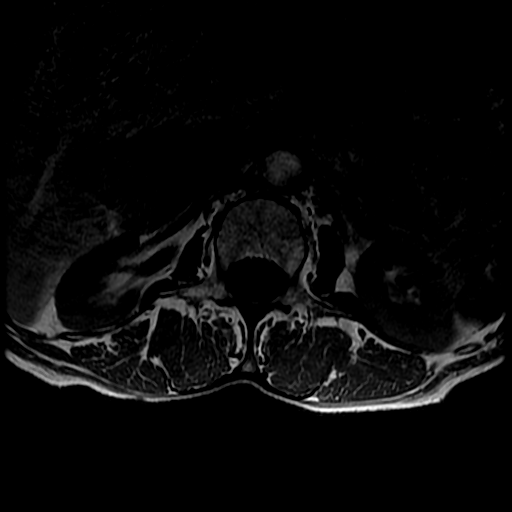

[19 of 48 positions shown; findings below may reference images not displayed]

FINDINGS: Normal signal is present in the conus medullaris which terminates at
L1-2. Marrow signal and vertebral body heights are maintained. Grade
1 anterolisthesis at L4-5 has progressed.

Sub cm cysts are present in both kidneys. Limited imaging the
abdomen is otherwise unremarkable.

L1-2:  Negative.

L2-3:  Negative.

L3-4: Lateral disc protrusions are more prominent left than right.
Mild left foraminal narrowing is stable.

L4-5: Moderate to severe facet hypertrophy is again noted. There is
uncovering of a broad-based disc protrusion. Progressive moderate
subarticular narrowing is evident bilaterally. Severe right and
moderate left foraminal stenosis have progressed.

L5-S1: A broad-based disc protrusion is present. Mild to moderate
subarticular stenosis is again seen bilaterally. Mild to moderate
foraminal narrowing is slightly worse than on the prior study, right
greater than left.
IMPRESSION: 1. Progressive severe right and moderate left foraminal stenosis at
L4-5.
2. Progressive moderate subarticular narrowing bilaterally at L4-5.
3. Mild to moderate foraminal and subarticular stenosis at L5-S1 has
progressed slightly as well, worse on the right.
4. Lateral disc protrusions at L3-4 with stable mild left foraminal
stenosis.

## 2016-09-08 IMAGING — RF DG C-ARM 61-120 MIN
1 series · 2 of 2 positions shown · non-contrast
Comparison: MRI 07/30/2015

CLINICAL DATA: Lumbar spine surgery.

EXAM:
DG C-ARM 61-120 MIN; LUMBAR SPINE - 2-3 VIEW

[Series 1: run · 2 of 2 slices shown]
[im 1/2]
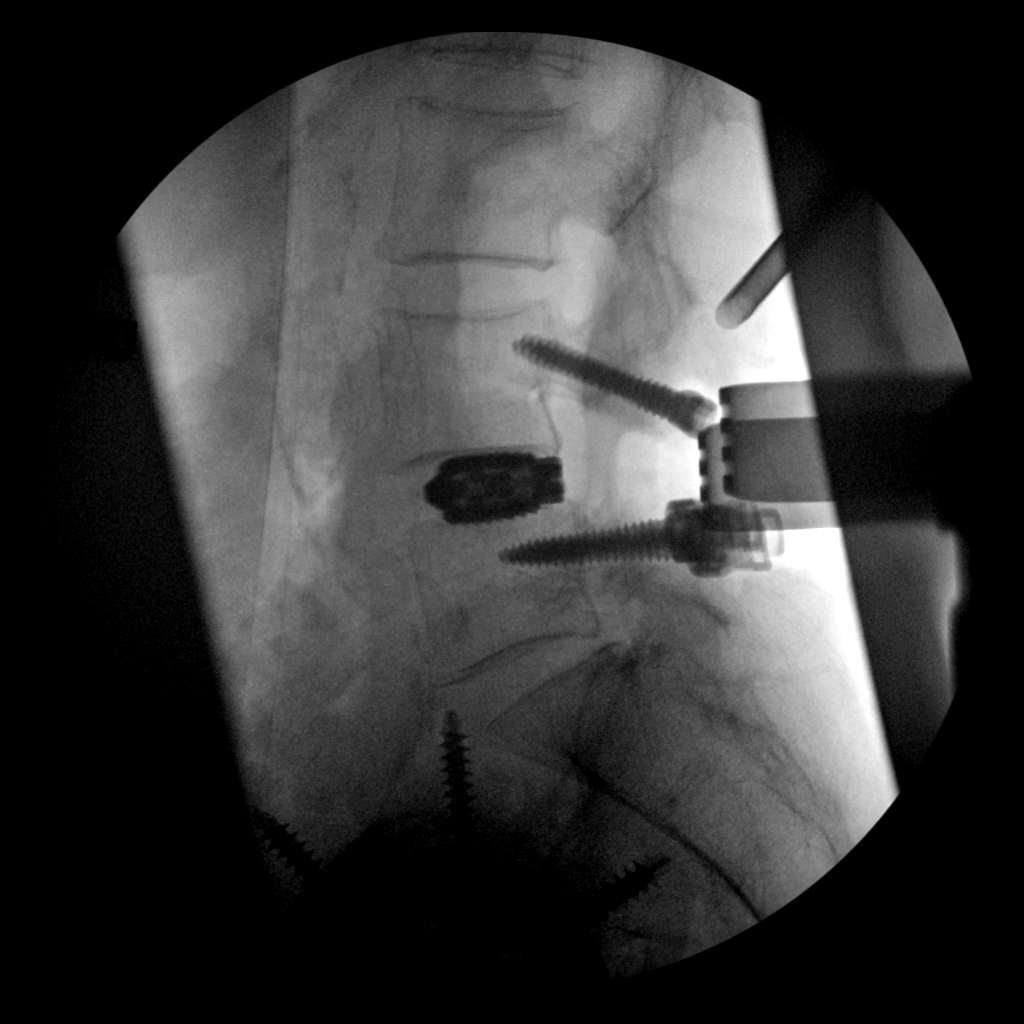
[im 2/2]
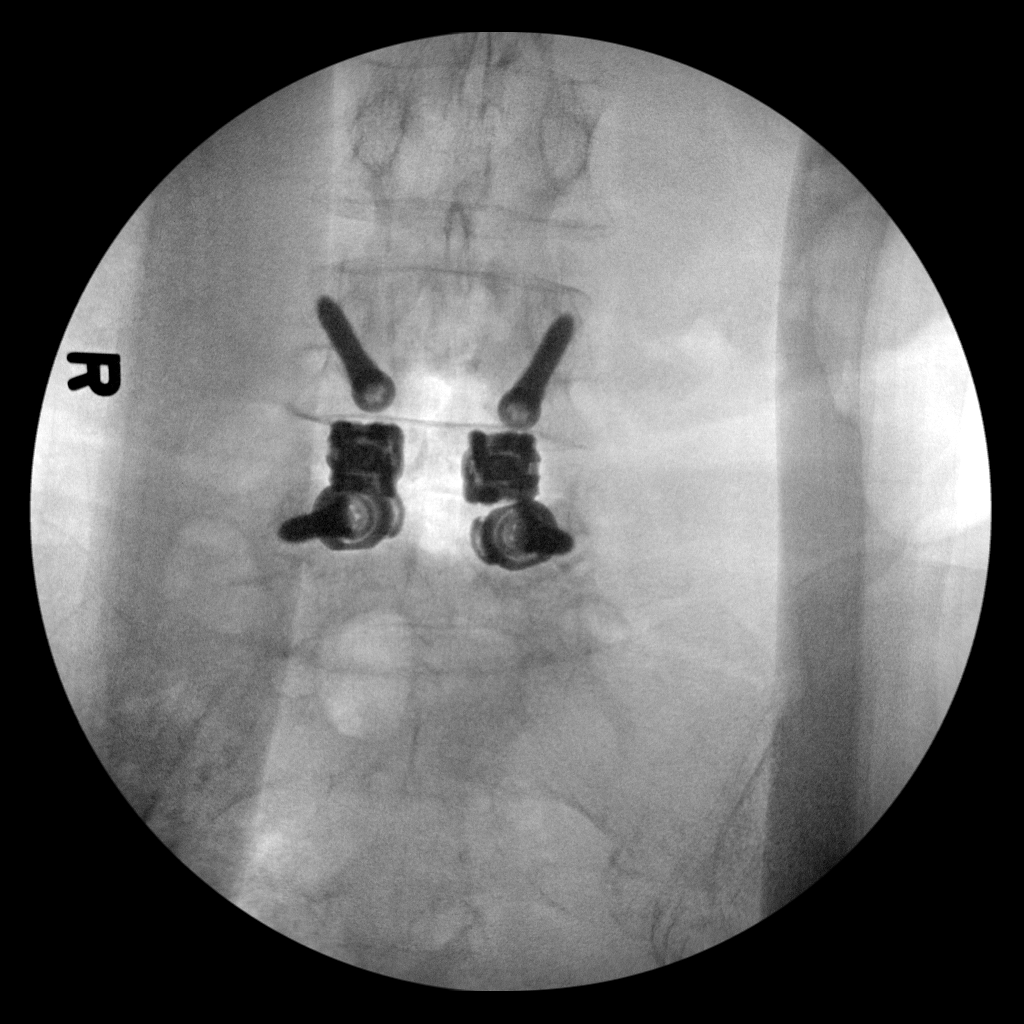

[2 of 2 positions shown; findings below may reference images not displayed]

FINDINGS: Lumbar spine numbered as per prior MRI. L4 and L5 posterior pedicle
screws and inter disc fusion noted. Hardware intact. Minimal
anterolisthesis L4 on L5 .
IMPRESSION: Postsurgical changes L4-L5.

## 2016-09-17 ENCOUNTER — Inpatient Hospital Stay (HOSPITAL_COMMUNITY)
Admission: EM | Admit: 2016-09-17 | Discharge: 2016-09-20 | DRG: 563 | Disposition: A | Discharge status: Skilled Nursing Facility | Payer: Medicare HMO | Attending: Internal Medicine | Admitting: Internal Medicine

## 2016-09-17 ENCOUNTER — Emergency Department (HOSPITAL_COMMUNITY): Payer: Medicare HMO

## 2016-09-17 ENCOUNTER — Encounter (HOSPITAL_COMMUNITY): Payer: Self-pay | Admitting: Emergency Medicine

## 2016-09-17 DIAGNOSIS — S42291A Other displaced fracture of upper end of right humerus, initial encounter for closed fracture: Secondary | ICD-10-CM | POA: Diagnosis present

## 2016-09-17 DIAGNOSIS — R627 Adult failure to thrive: Secondary | ICD-10-CM | POA: Diagnosis not present

## 2016-09-17 DIAGNOSIS — F332 Major depressive disorder, recurrent severe without psychotic features: Secondary | ICD-10-CM | POA: Diagnosis present

## 2016-09-17 DIAGNOSIS — M21372 Foot drop, left foot: Secondary | ICD-10-CM | POA: Diagnosis present

## 2016-09-17 DIAGNOSIS — F17210 Nicotine dependence, cigarettes, uncomplicated: Secondary | ICD-10-CM | POA: Diagnosis present

## 2016-09-17 DIAGNOSIS — R29898 Other symptoms and signs involving the musculoskeletal system: Secondary | ICD-10-CM | POA: Diagnosis present

## 2016-09-17 DIAGNOSIS — S2239XA Fracture of one rib, unspecified side, initial encounter for closed fracture: Secondary | ICD-10-CM | POA: Diagnosis present

## 2016-09-17 DIAGNOSIS — I10 Essential (primary) hypertension: Secondary | ICD-10-CM | POA: Diagnosis not present

## 2016-09-17 DIAGNOSIS — G8929 Other chronic pain: Secondary | ICD-10-CM | POA: Diagnosis present

## 2016-09-17 DIAGNOSIS — Z9889 Other specified postprocedural states: Secondary | ICD-10-CM

## 2016-09-17 DIAGNOSIS — I739 Peripheral vascular disease, unspecified: Secondary | ICD-10-CM | POA: Diagnosis present

## 2016-09-17 DIAGNOSIS — R269 Unspecified abnormalities of gait and mobility: Secondary | ICD-10-CM

## 2016-09-17 DIAGNOSIS — S2242XA Multiple fractures of ribs, left side, initial encounter for closed fracture: Secondary | ICD-10-CM

## 2016-09-17 DIAGNOSIS — Z720 Tobacco use: Secondary | ICD-10-CM

## 2016-09-17 DIAGNOSIS — Z809 Family history of malignant neoplasm, unspecified: Secondary | ICD-10-CM

## 2016-09-17 DIAGNOSIS — W19XXXA Unspecified fall, initial encounter: Secondary | ICD-10-CM | POA: Diagnosis present

## 2016-09-17 DIAGNOSIS — Z8249 Family history of ischemic heart disease and other diseases of the circulatory system: Secondary | ICD-10-CM

## 2016-09-17 DIAGNOSIS — S42201A Unspecified fracture of upper end of right humerus, initial encounter for closed fracture: Secondary | ICD-10-CM

## 2016-09-17 DIAGNOSIS — Z79899 Other long term (current) drug therapy: Secondary | ICD-10-CM

## 2016-09-17 DIAGNOSIS — Z7952 Long term (current) use of systemic steroids: Secondary | ICD-10-CM

## 2016-09-17 DIAGNOSIS — S2242XB Multiple fractures of ribs, left side, initial encounter for open fracture: Secondary | ICD-10-CM

## 2016-09-17 DIAGNOSIS — R296 Repeated falls: Secondary | ICD-10-CM

## 2016-09-17 DIAGNOSIS — S2249XA Multiple fractures of ribs, unspecified side, initial encounter for closed fracture: Secondary | ICD-10-CM | POA: Diagnosis present

## 2016-09-17 DIAGNOSIS — N289 Disorder of kidney and ureter, unspecified: Secondary | ICD-10-CM | POA: Diagnosis present

## 2016-09-17 DIAGNOSIS — Z96641 Presence of right artificial hip joint: Secondary | ICD-10-CM | POA: Diagnosis present

## 2016-09-17 DIAGNOSIS — Z23 Encounter for immunization: Secondary | ICD-10-CM

## 2016-09-17 DIAGNOSIS — I959 Hypotension, unspecified: Secondary | ICD-10-CM | POA: Diagnosis present

## 2016-09-17 LAB — URINE MICROSCOPIC-ADD ON: RBC / HPF: NONE SEEN RBC/hpf (ref 0–5)

## 2016-09-17 LAB — BASIC METABOLIC PANEL WITH GFR
Anion gap: 13 (ref 5–15)
BUN: 21 mg/dL — ABNORMAL HIGH (ref 6–20)
CO2: 20 mmol/L — ABNORMAL LOW (ref 22–32)
Calcium: 9.7 mg/dL (ref 8.9–10.3)
Chloride: 105 mmol/L (ref 101–111)
Creatinine, Ser: 1.37 mg/dL — ABNORMAL HIGH (ref 0.44–1.00)
GFR calc Af Amer: 43 mL/min — ABNORMAL LOW
GFR calc non Af Amer: 37 mL/min — ABNORMAL LOW
Glucose, Bld: 127 mg/dL — ABNORMAL HIGH (ref 65–99)
Potassium: 4.1 mmol/L (ref 3.5–5.1)
Sodium: 138 mmol/L (ref 135–145)

## 2016-09-17 LAB — URINALYSIS, ROUTINE W REFLEX MICROSCOPIC
Bilirubin Urine: NEGATIVE
Glucose, UA: NEGATIVE mg/dL
Hgb urine dipstick: NEGATIVE
Ketones, ur: NEGATIVE mg/dL
Leukocytes, UA: NEGATIVE
Nitrite: NEGATIVE
Protein, ur: 100 mg/dL — AB
Specific Gravity, Urine: 1.013 (ref 1.005–1.030)
pH: 6 (ref 5.0–8.0)

## 2016-09-17 LAB — CBC
HCT: 33.1 % — ABNORMAL LOW (ref 36.0–46.0)
Hemoglobin: 11.1 g/dL — ABNORMAL LOW (ref 12.0–15.0)
MCH: 27.1 pg (ref 26.0–34.0)
MCHC: 33.5 g/dL (ref 30.0–36.0)
MCV: 80.7 fL (ref 78.0–100.0)
Platelets: 395 K/uL (ref 150–400)
RBC: 4.1 MIL/uL (ref 3.87–5.11)
RDW: 14.5 % (ref 11.5–15.5)
WBC: 11.8 K/uL — ABNORMAL HIGH (ref 4.0–10.5)

## 2016-09-17 MED ORDER — HYDROCHLOROTHIAZIDE 25 MG PO TABS: 25.0000 mg | ORAL_TABLET | Freq: Every day | ORAL | Status: DC

## 2016-09-17 MED ORDER — BUSPIRONE HCL 15 MG PO TABS
15.0000 mg | ORAL_TABLET | Freq: Every day | ORAL | Status: DC
  Administered 2016-09-17 – 2016-09-20 (×4): 15 mg via ORAL
  Filled 2016-09-17 (×4): qty 1

## 2016-09-17 MED ORDER — ADULT MULTIVITAMIN W/MINERALS CH
1.0000 | ORAL_TABLET | Freq: Every day | ORAL | Status: DC
  Administered 2016-09-17 – 2016-09-20 (×4): 1 via ORAL
  Filled 2016-09-17 (×4): qty 1

## 2016-09-17 MED ORDER — NICOTINE 21 MG/24HR TD PT24
MEDICATED_PATCH | TRANSDERMAL | Status: AC
  Filled 2016-09-17: qty 1

## 2016-09-17 MED ORDER — MORPHINE SULFATE (PF) 4 MG/ML IV SOLN
2.0000 mg | Freq: Once | INTRAVENOUS | Status: AC
  Administered 2016-09-17: 2 mg via INTRAVENOUS
  Filled 2016-09-17: qty 1

## 2016-09-17 MED ORDER — DIPHENHYDRAMINE HCL 25 MG PO CAPS: 25.0000 mg | ORAL_CAPSULE | Freq: Four times a day (QID) | ORAL | Status: DC | PRN

## 2016-09-17 MED ORDER — ONDANSETRON HCL 4 MG PO TABS: 4.0000 mg | ORAL_TABLET | Freq: Four times a day (QID) | ORAL | Status: DC | PRN

## 2016-09-17 MED ORDER — POLYETHYLENE GLYCOL 3350 17 G PO PACK: 17.0000 g | PACK | Freq: Every day | ORAL | Status: DC | PRN

## 2016-09-17 MED ORDER — FERROUS SULFATE 325 (65 FE) MG PO TABS
325.0000 mg | ORAL_TABLET | Freq: Every day | ORAL | Status: DC
  Administered 2016-09-18 – 2016-09-20 (×3): 325 mg via ORAL
  Filled 2016-09-17 (×3): qty 1

## 2016-09-17 MED ORDER — OXYCODONE HCL 5 MG PO TABS
5.0000 mg | ORAL_TABLET | Freq: Two times a day (BID) | ORAL | Status: DC | PRN
  Administered 2016-09-17 – 2016-09-20 (×5): 5 mg via ORAL
  Filled 2016-09-17 (×5): qty 1

## 2016-09-17 MED ORDER — PANTOPRAZOLE SODIUM 40 MG PO TBEC
40.0000 mg | DELAYED_RELEASE_TABLET | Freq: Two times a day (BID) | ORAL | Status: DC
  Administered 2016-09-17 – 2016-09-20 (×6): 40 mg via ORAL
  Filled 2016-09-17 (×6): qty 1

## 2016-09-17 MED ORDER — ENOXAPARIN SODIUM 30 MG/0.3ML ~~LOC~~ SOLN
30.0000 mg | SUBCUTANEOUS | Status: DC
  Administered 2016-09-17 – 2016-09-19 (×3): 30 mg via SUBCUTANEOUS
  Filled 2016-09-17 (×3): qty 0.3

## 2016-09-17 MED ORDER — LABETALOL HCL 5 MG/ML IV SOLN
10.0000 mg | Freq: Once | INTRAVENOUS | Status: AC
  Administered 2016-09-17: 10 mg via INTRAVENOUS
  Filled 2016-09-17: qty 4

## 2016-09-17 MED ORDER — SODIUM CHLORIDE 0.45 % IV SOLN
INTRAVENOUS | Status: DC
  Administered 2016-09-17 – 2016-09-20 (×4): via INTRAVENOUS

## 2016-09-17 MED ORDER — CELECOXIB 100 MG PO CAPS
100.0000 mg | ORAL_CAPSULE | Freq: Two times a day (BID) | ORAL | Status: DC | PRN
  Filled 2016-09-17: qty 1

## 2016-09-17 MED ORDER — ONDANSETRON HCL 4 MG/2ML IJ SOLN: 4.0000 mg | Freq: Four times a day (QID) | INTRAMUSCULAR | Status: DC | PRN

## 2016-09-17 MED ORDER — ROSUVASTATIN CALCIUM 10 MG PO TABS
10.0000 mg | ORAL_TABLET | Freq: Every day | ORAL | Status: DC
  Administered 2016-09-18 – 2016-09-20 (×3): 10 mg via ORAL
  Filled 2016-09-17 (×3): qty 1

## 2016-09-17 MED ORDER — PREDNISONE 20 MG PO TABS
20.0000 mg | ORAL_TABLET | Freq: Every day | ORAL | Status: DC
  Administered 2016-09-17 – 2016-09-20 (×4): 20 mg via ORAL
  Filled 2016-09-17 (×4): qty 1

## 2016-09-17 MED ORDER — TRAZODONE HCL 50 MG PO TABS: 50.0000 mg | ORAL_TABLET | Freq: Every evening | ORAL | Status: DC | PRN

## 2016-09-17 MED ORDER — IRBESARTAN 300 MG PO TABS
300.0000 mg | ORAL_TABLET | Freq: Every day | ORAL | Status: DC
  Administered 2016-09-17 – 2016-09-20 (×4): 300 mg via ORAL
  Filled 2016-09-17 (×4): qty 1

## 2016-09-17 MED ORDER — AMLODIPINE BESYLATE 10 MG PO TABS
10.0000 mg | ORAL_TABLET | Freq: Every day | ORAL | Status: DC
  Administered 2016-09-17 – 2016-09-20 (×4): 10 mg via ORAL
  Filled 2016-09-17 (×4): qty 1

## 2016-09-17 MED ORDER — ACETAMINOPHEN 325 MG PO TABS
650.0000 mg | ORAL_TABLET | Freq: Four times a day (QID) | ORAL | Status: DC | PRN
  Administered 2016-09-19 – 2016-09-20 (×2): 650 mg via ORAL
  Filled 2016-09-17 (×2): qty 2

## 2016-09-17 MED ORDER — NICOTINE 21 MG/24HR TD PT24
21.0000 mg | MEDICATED_PATCH | Freq: Every day | TRANSDERMAL | Status: DC
  Administered 2016-09-17 – 2016-09-20 (×4): 21 mg via TRANSDERMAL
  Filled 2016-09-17 (×3): qty 1

## 2016-09-17 MED ORDER — CLONAZEPAM 1 MG PO TABS
2.0000 mg | ORAL_TABLET | Freq: Every day | ORAL | Status: DC
  Administered 2016-09-17 – 2016-09-19 (×3): 2 mg via ORAL
  Filled 2016-09-17 (×3): qty 2

## 2016-09-17 MED ORDER — GABAPENTIN 100 MG PO CAPS
100.0000 mg | ORAL_CAPSULE | Freq: Three times a day (TID) | ORAL | Status: DC
  Administered 2016-09-17 – 2016-09-20 (×9): 100 mg via ORAL
  Filled 2016-09-17 (×9): qty 1

## 2016-09-17 NOTE — ED Provider Notes (Signed)
[facility] DEPT Provider Note   CSN: [id number] Arrival date & time: 09/17/16  1256     History   Chief Complaint Chief Complaint  Patient presents with  . Fall    HPI Alyssa [name] is a 75 y.o. female.  The history is provided by the patient and a relative.  Fall  This is a chronic problem. The current episode started 1 to 2 hours ago. The problem occurs daily. The problem has not changed since onset.Associated symptoms include chest pain (left lower) and headaches. Pertinent negatives include no abdominal pain and no shortness of breath. Nothing aggravates the symptoms. Nothing relieves the symptoms. She has tried nothing for the symptoms. The treatment provided no relief.    Past Medical History:  Diagnosis Date  . Anxiety   . Arthritis   . Headache(784.0)   . Hyperlipidemia   . Hypertension   . Joint pain   . Leg pain   . Peripheral neuropathy ([place])   . Peripheral vascular disease [facility])     Patient Active Problem List   Diagnosis Date Noted  . Fall 09/17/2016  . Fracture of humeral head, closed, right, initial encounter 09/17/2016  . Rib fractures 09/17/2016  . Multiple falls 09/17/2016  . Severe muscle deconditioning 09/17/2016  . Failure to thrive in adult 09/17/2016  . Melena 04/25/2016  . Acute kidney injury ([place]) 04/25/2016  . Essential hypertension 04/25/2016  . Anxiety state 04/25/2016  . Tobacco abuse 04/25/2016  . Hyperlipidemia 04/25/2016  . Syncope 04/24/2016  . Syncope and collapse 04/24/2016  . Gait difficulty 01/12/2016  . Foot drop, left 01/12/2016  . Severe recurrent major depression without psychotic features ([place]) 08/28/2015  . Spondylolisthesis at L4-L5 level 07/30/2015  . PVD (peripheral vascular disease) with claudication ([place]) 07/29/2014  . Weakness-Bilateral arm/leg 07/29/2014  . Numbness-left leg 07/29/2014  . Swelling of limb-Legs 07/29/2014  . Atherosclerosis of native arteries of the extremities with intermittent  claudication 09/30/2011    Past Surgical History:  Procedure Laterality Date  . ABDOMINAL ANGIOGRAM N/A 10/29/2011   Procedure: ABDOMINAL ANGIOGRAM;  Surgeon: Elam Dutch, MD;  Location: [facility] CATH LAB;  Service: Cardiovascular;  Laterality: N/A;  . BACK SURGERY    . FEMORAL-FEMORAL BYPASS GRAFT  08/31/2010  . JOINT REPLACEMENT Right 2012   Hip  . TOTAL HIP ARTHROPLASTY     right    OB History    No data available       Home Medications    Prior to Admission medications   Medication Sig Start Date End Date Taking? Authorizing Provider  acetaminophen (TYLENOL) 325 MG tablet Take 2 tablets (650 mg total) by mouth every 6 (six) hours as needed for mild pain (or Fever >/= 101). 04/26/16  Yes Verlee Monte, MD  amLODipine (NORVASC) 10 MG tablet Take 10 mg by mouth daily.   Yes Historical Provider, MD  busPIRone (BUSPAR) 15 MG tablet Take 15 mg by mouth daily.  12/31/15  Yes Historical Provider, MD  celecoxib (CELEBREX) 100 MG capsule Take 1 capsule (100 mg total) by mouth 2 (two) times daily as needed. 05/31/16  Yes Marcial Pacas, MD  citalopram (CELEXA) 40 MG tablet Take 1 tablet (40 mg total) by mouth daily. Patient taking differently: Take 40 mg by mouth daily after lunch.  08/30/15  Yes Delfin Gant, NP  clonazePAM (KLONOPIN) 2 MG tablet Take 2 mg by mouth at bedtime. 09/04/16  Yes Historical Provider, MD  diphenhydrAMINE (BENADRYL) 25 mg capsule Take 1 capsule (  25 mg total) by mouth every 6 (six) hours as needed for itching. 12/28/15  Yes Varney Biles, MD  ferrous sulfate 325 (65 FE) MG tablet Take 325 mg by mouth daily with breakfast.   Yes Historical Provider, MD  gabapentin (NEURONTIN) 100 MG capsule Take 1 capsule (100 mg total) by mouth 3 (three) times daily. 08/31/16  Yes Marcial Pacas, MD  hydrochlorothiazide (HYDRODIURIL) 25 MG tablet Take 25 mg by mouth daily.   Yes Historical Provider, MD  Multiple Vitamin (MULTIVITAMIN WITH MINERALS) TABS tablet Take 1 tablet by mouth  daily. One a Day Women's   Yes Historical Provider, MD  oxyCODONE (OXY IR/ROXICODONE) 5 MG immediate release tablet Take 1 tablet by mouth 2 (two) times daily as needed for pain. 09/04/16  Yes Historical Provider, MD  polyethylene glycol (MIRALAX / GLYCOLAX) packet Take 17 g by mouth daily. Patient taking differently: Take 17 g by mouth daily as needed for mild constipation. Mix in 8 oz liquid and drink 08/30/15  Yes Delfin Gant, NP  predniSONE (DELTASONE) 10 MG tablet Take 2 tablets (20 mg total) by mouth daily. 07/25/16  Yes Sherlene Shams, MD  rosuvastatin (CRESTOR) 10 MG tablet Take 10 mg by mouth daily.    Yes Historical Provider, MD  traZODone (DESYREL) 50 MG tablet Take 1 tablet (50 mg total) by mouth at bedtime as needed for sleep. 08/30/15  Yes Delfin Gant, NP  valsartan (DIOVAN) 320 MG tablet Take 320 mg by mouth daily. 08/29/16  Yes Historical Provider, MD  pantoprazole (PROTONIX) 40 MG tablet Take 1 tablet (40 mg total) by mouth 2 (two) times daily. Patient not taking: Reported on 09/17/2016 04/26/16   Verlee Monte, MD    Family History Family History  Problem Relation Age of Onset  . Heart attack Mother   . Heart disease Mother   . Hyperlipidemia Mother   . Hypertension Mother   . Heart attack Father   . Heart disease Father     Before age 32  . Hyperlipidemia Father   . Hypertension Father   . Cancer Sister   . Aneurysm Brother   . Cancer Brother     Social History Social History  Substance Use Topics  . Smoking status: Current Every Day Smoker    Packs/day: 0.50    Types: Cigarettes    Last attempt to quit: 01/27/2014  . Smokeless tobacco: Never Used  . Alcohol use No     Allergies   Patient has no known allergies.   Review of Systems Review of Systems  Constitutional: Positive for activity change, appetite change and fatigue. Negative for chills and fever.  HENT: Negative for ear pain and sore throat.   Eyes: Negative for pain and visual  disturbance.  Respiratory: Negative for cough and shortness of breath.   Cardiovascular: Positive for chest pain (left lower). Negative for palpitations.  Gastrointestinal: Negative for abdominal pain and vomiting.  Genitourinary: Negative for dysuria and hematuria.  Musculoskeletal: Positive for arthralgias, back pain, gait problem and joint swelling.  Skin: Negative for color change and rash.  Neurological: Positive for headaches. Negative for seizures and syncope.  All other systems reviewed and are negative.    Physical Exam Updated Vital Signs BP (!) 185/107 (BP Location: Left Arm)   Pulse 92   Temp 98.2 F (36.8 C) (Oral)   Resp (!) 22   Ht 4\' 9"  (1.448 m)   Wt 43.6 kg   SpO2 99%   BMI 20.80 kg/m  Physical Exam  Constitutional: She is oriented to person, place, and time. She appears well-developed and well-nourished. No distress.  HENT:  Head: Normocephalic and atraumatic.  Eyes: Conjunctivae and EOM are normal. Pupils are equal, round, and reactive to light.  Neck: Neck supple.  Cardiovascular: Normal rate, regular rhythm and normal heart sounds.   No murmur heard. Pulmonary/Chest: Effort normal and breath sounds normal. No respiratory distress.  Abdominal: Soft. She exhibits no mass. There is no tenderness. There is no rebound and no guarding.  Musculoskeletal: She exhibits no edema.       Right shoulder: She exhibits decreased range of motion, tenderness, bony tenderness and swelling.       Left hip: She exhibits decreased range of motion, decreased strength and tenderness.       Cervical back: She exhibits no bony tenderness.       Thoracic back: She exhibits no bony tenderness.       Lumbar back: She exhibits no bony tenderness.  Neurological: She is alert and oriented to person, place, and time. No cranial nerve deficit or sensory deficit. She exhibits normal muscle tone. Coordination normal.  Skin: Skin is warm and dry. Capillary refill takes less than 2  seconds.  Psychiatric: She has a normal mood and affect.  Nursing note and vitals reviewed.    ED Treatments / Results  Labs (all labs ordered are listed, but only abnormal results are displayed) Labs Reviewed  BASIC METABOLIC PANEL - Abnormal; Notable for the following:       Result Value   CO2 20 (*)    Glucose, Bld 127 (*)    BUN 21 (*)    Creatinine, Ser 1.37 (*)    GFR calc non Af Amer 37 (*)    GFR calc Af Amer 43 (*)    All other components within normal limits  CBC - Abnormal; Notable for the following:    WBC 11.8 (*)    Hemoglobin 11.1 (*)    HCT 33.1 (*)    All other components within normal limits  URINALYSIS, ROUTINE W REFLEX MICROSCOPIC (NOT AT [facility]) - Abnormal; Notable for the following:    Protein, ur 100 (*)    All other components within normal limits  URINE MICROSCOPIC-ADD ON - Abnormal; Notable for the following:    Squamous Epithelial / LPF 6-30 (*)    Bacteria, UA FEW (*)    All other components within normal limits  BASIC METABOLIC PANEL    EKG  EKG Interpretation None       Radiology Dg Chest 2 View  Result Date: 09/17/2016 CLINICAL DATA:  Multiple falls with arm pain and shortness of breath EXAM: CHEST  2 VIEW COMPARISON:  04/24/2016 FINDINGS: Cardiac shadow is within normal limits. The thoracic aorta is tortuous. The lungs are clear bilaterally. Changes are noted in the proximal right humerus similar to that seen on recent shoulder film. Fractures of the seventh and eighth ribs on the left are noted without pneumothorax. No other focal abnormality is seen. IMPRESSION: Left rib fractures without complications. Proximal humeral fracture similar to that seen on recent shoulder film. Electronically Signed   By: Inez Catalina M.D.   On: 09/17/2016 17:23   Dg Shoulder Right  Result Date: 09/17/2016 CLINICAL DATA:  [place] yesterday. Pain across right shoulder. Pain is worse with abduction. EXAM: RIGHT SHOULDER - 2+ VIEW COMPARISON:  Two-view chest x-ray  04/24/2016. FINDINGS: A nondisplaced fracture is present within the right humeral head. The right  humerus is located. AC joint is intact. Clavicle is intact. A remote right fifth rib fracture is again seen. The visualized right hemi thorax is otherwise clear. IMPRESSION: 1. Acute/subacute nondisplaced fracture within the right humeral head. Electronically Signed   By: San Morelle M.D.   On: 09/17/2016 14:41   Ct Head Wo Contrast  Result Date: 09/17/2016 CLINICAL DATA:  [place] yesterday, dizziness and headache. History of hypertension and hyperlipidemia. EXAM: CT HEAD WITHOUT CONTRAST CT CERVICAL SPINE WITHOUT CONTRAST TECHNIQUE: Multidetector CT imaging of the head and cervical spine was performed following the standard protocol without intravenous contrast. Multiplanar CT image reconstructions of the cervical spine were also generated. COMPARISON:  MRI of the head April 25, 2016 FINDINGS: CT HEAD FINDINGS BRAIN: The ventricles and sulci are normal for age. No intraparenchymal hemorrhage, mass effect nor midline shift. Patchy supratentorial white matter hypodensities within normal range for patient's age, though non-specific are most compatible with chronic small vessel ischemic disease. No acute large vascular territory infarcts. No abnormal extra-axial fluid collections. Basal cisterns are patent. VASCULAR: Moderate calcific atherosclerosis of the carotid siphons. SKULL: No skull fracture. No significant scalp soft tissue swelling. SINUSES/ORBITS: Status post fat. Paranasal sinuses are well-aerated. Mastoid air cells are well aerated. The included ocular globes and orbital contents are non-suspicious. Status post bilateral ocular lens implants. OTHER: None. CT CERVICAL SPINE FINDINGS ALIGNMENT: Maintained lordosis.  Grade 1 C7-T1 anterolisthesis. SKULL BASE AND VERTEBRAE: Cervical vertebral bodies and posterior elements are intact. Severe C3-4, severe C6-7, C7-T1 and T1-2 disc height loss with endplate  sclerosis and marginal spurring compatible with degenerative discs. Moderate to severe C5-6 degenerative disc. Severe facet arthropathy. LEFT C6-7 facets are fused on degenerative basis. C1-2 articulation maintained with mild arthropathy. SOFT TISSUES AND SPINAL CANAL: Nonacute. Calcified stylohyoid ligaments. Moderate calcific atherosclerosis of the carotid bifurcations. DISC LEVELS: Moderate canal stenosis C3-4, C7-T1. Severe LEFT C3-4, moderate to severe bilateral C5-6 and C7-T1 neural foraminal narrowing. UPPER CHEST: Severe centrilobular emphysema and included lung apices. OTHER: None. IMPRESSION: CT HEAD: No acute intracranial process. Stable examination including moderate chronic small vessel ischemic disease. CT CERVICAL SPINE: No acute fracture. Grade 1 C7-T1 anterolisthesis on degenerative basis. Moderate canal stenosis C3-4 and C7-T1. Severe LEFT C3-4 neural foraminal narrowing. Electronically Signed   By: Elon Alas M.D.   On: 09/17/2016 18:09   Ct Cervical Spine Wo Contrast  Result Date: 09/17/2016 CLINICAL DATA:  [place] yesterday, dizziness and headache. History of hypertension and hyperlipidemia. EXAM: CT HEAD WITHOUT CONTRAST CT CERVICAL SPINE WITHOUT CONTRAST TECHNIQUE: Multidetector CT imaging of the head and cervical spine was performed following the standard protocol without intravenous contrast. Multiplanar CT image reconstructions of the cervical spine were also generated. COMPARISON:  MRI of the head April 25, 2016 FINDINGS: CT HEAD FINDINGS BRAIN: The ventricles and sulci are normal for age. No intraparenchymal hemorrhage, mass effect nor midline shift. Patchy supratentorial white matter hypodensities within normal range for patient's age, though non-specific are most compatible with chronic small vessel ischemic disease. No acute large vascular territory infarcts. No abnormal extra-axial fluid collections. Basal cisterns are patent. VASCULAR: Moderate calcific atherosclerosis of the  carotid siphons. SKULL: No skull fracture. No significant scalp soft tissue swelling. SINUSES/ORBITS: Status post fat. Paranasal sinuses are well-aerated. Mastoid air cells are well aerated. The included ocular globes and orbital contents are non-suspicious. Status post bilateral ocular lens implants. OTHER: None. CT CERVICAL SPINE FINDINGS ALIGNMENT: Maintained lordosis.  Grade 1 C7-T1 anterolisthesis. SKULL BASE AND VERTEBRAE: Cervical vertebral bodies  and posterior elements are intact. Severe C3-4, severe C6-7, C7-T1 and T1-2 disc height loss with endplate sclerosis and marginal spurring compatible with degenerative discs. Moderate to severe C5-6 degenerative disc. Severe facet arthropathy. LEFT C6-7 facets are fused on degenerative basis. C1-2 articulation maintained with mild arthropathy. SOFT TISSUES AND SPINAL CANAL: Nonacute. Calcified stylohyoid ligaments. Moderate calcific atherosclerosis of the carotid bifurcations. DISC LEVELS: Moderate canal stenosis C3-4, C7-T1. Severe LEFT C3-4, moderate to severe bilateral C5-6 and C7-T1 neural foraminal narrowing. UPPER CHEST: Severe centrilobular emphysema and included lung apices. OTHER: None. IMPRESSION: CT HEAD: No acute intracranial process. Stable examination including moderate chronic small vessel ischemic disease. CT CERVICAL SPINE: No acute fracture. Grade 1 C7-T1 anterolisthesis on degenerative basis. Moderate canal stenosis C3-4 and C7-T1. Severe LEFT C3-4 neural foraminal narrowing. Electronically Signed   By: Elon Alas M.D.   On: 09/17/2016 18:09   Dg Hip Unilat With Pelvis 2-3 Views Left  Result Date: 09/17/2016 CLINICAL DATA:  Fall yesterday with hip pain, initial encounter EXAM: DG HIP (WITH OR WITHOUT PELVIS) 2-3V LEFT COMPARISON:  None. FINDINGS: Pelvic ring is intact. Right hip replacement is noted with significant remodeling of the acetabulum. No acute fracture or dislocation is noted. No soft tissue abnormality is seen. IMPRESSION:  Chronic changes without acute abnormality. Electronically Signed   By: Inez Catalina M.D.   On: 09/17/2016 17:24    Procedures Procedures (including critical care time)  Medications Ordered in ED Medications  amLODipine (NORVASC) tablet 10 mg (10 mg Oral Given 09/17/16 2202)  clonazePAM (KLONOPIN) tablet 2 mg (2 mg Oral Given 09/17/16 2202)  hydrochlorothiazide (HYDRODIURIL) tablet 25 mg (not administered)  oxyCODONE (Oxy IR/ROXICODONE) immediate release tablet 5 mg (5 mg Oral Given 09/17/16 2326)  irbesartan (AVAPRO) tablet 300 mg (300 mg Oral Given 09/17/16 2202)  gabapentin (NEURONTIN) capsule 100 mg (100 mg Oral Given 09/17/16 2202)  predniSONE (DELTASONE) tablet 20 mg (20 mg Oral Given 09/17/16 2202)  celecoxib (CELEBREX) capsule 100 mg (not administered)  acetaminophen (TYLENOL) tablet 650 mg (not administered)  pantoprazole (PROTONIX) EC tablet 40 mg (40 mg Oral Given 09/17/16 2202)  busPIRone (BUSPAR) tablet 15 mg (15 mg Oral Given 09/17/16 2202)  diphenhydrAMINE (BENADRYL) capsule 25 mg (not administered)  polyethylene glycol (MIRALAX / GLYCOLAX) packet 17 g (not administered)  traZODone (DESYREL) tablet 50 mg (not administered)  multivitamin with minerals tablet 1 tablet (1 tablet Oral Given 09/17/16 2202)  ferrous sulfate tablet 325 mg (not administered)  rosuvastatin (CRESTOR) tablet 10 mg (not administered)  enoxaparin (LOVENOX) injection 30 mg (30 mg Subcutaneous Given 09/17/16 2207)  0.45 % sodium chloride infusion ( Intravenous New Bag/Given 09/17/16 2209)  ondansetron (ZOFRAN) tablet 4 mg (not administered)    Or  ondansetron (ZOFRAN) injection 4 mg (not administered)  nicotine (NICODERM CQ - dosed in mg/24 hours) patch 21 mg (21 mg Transdermal Patch Applied 09/17/16 2335)  pneumococcal 23 valent vaccine (PNU-IMMUNE) injection 0.5 mL (not administered)  feeding supplement (ENSURE ENLIVE) (ENSURE ENLIVE) liquid 237 mL (not administered)  labetalol (NORMODYNE,TRANDATE) injection 10  mg (10 mg Intravenous Given 09/17/16 2025)  morphine 4 MG/ML injection 2 mg (2 mg Intravenous Given 09/17/16 2024)     Initial Impression / Assessment and Plan / ED Course  I have reviewed the triage vital signs and the nursing notes.  Pertinent labs & imaging results that were available during my care of the patient were reviewed by me and considered in my medical decision making (see chart for details).  Clinical  Course     Patient is a 75 year old female comes today with her son after she fell at home. She has right shoulder pain as well as left chest pain and left pelvic pain. Imaging shows a right nondisplaced humeral head fracture. Patient's normal we put in a sling and she'll follow-up as an outpatient with orthopedics. She has left rib fractures and no fractures in the pelvis. She'll be given incentive spirometry and admitted for further evaluation for rib fractures as well as physical therapy evaluation. Her vital signs show mild hypertension she is given a dose of labetalol home medications. Admitted to the floor for further management. No other injuries found reported. CT of her head and neck are unremarkable for any acute changes. EKG is unremarkable for any acute ischemic changes. Laboratory testing is unremarkable for contribution to patient's evaluation.  Final Clinical Impressions(s) / ED Diagnoses   Final diagnoses:  Fall  Fall, initial encounter  Closed fracture of multiple ribs of left side, initial encounter  Closed fracture of proximal end of right humerus, unspecified fracture morphology, initial encounter    New Prescriptions Current Discharge Medication List       Dewaine Conger, MD 09/18/16 [id number]    Quintella Reichert, MD 09/21/16 [phone]

## 2016-09-17 NOTE — ED Notes (Signed)
The pt remains alert c/o rt shoulder pain now  Ice pack placed on the rt shoulder

## 2016-09-17 NOTE — ED Notes (Signed)
Floor refusing to take pt due to blood pressure

## 2016-09-17 NOTE — ED Notes (Signed)
Correction to the above statement  Its her rt shoulder and her lt chest

## 2016-09-17 NOTE — ED Triage Notes (Signed)
Pt states she had a mechanical fall three times yesterday, states she tripped over some clothes while carrying them, stating the other two times she "stumbled". Pt c/o pain all over, "head, side, legs". Pt has swelling to R shoulder. Pt respirations e/u. Pt difficult historian. AAOx4.
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
## 2016-09-17 NOTE — ED Notes (Signed)
To x-ray
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
## 2016-09-18 DIAGNOSIS — W19XXXA Unspecified fall, initial encounter: Secondary | ICD-10-CM | POA: Diagnosis not present

## 2016-09-18 DIAGNOSIS — M21372 Foot drop, left foot: Secondary | ICD-10-CM | POA: Diagnosis not present

## 2016-09-18 DIAGNOSIS — S42291A Other displaced fracture of upper end of right humerus, initial encounter for closed fracture: Secondary | ICD-10-CM | POA: Diagnosis not present

## 2016-09-18 DIAGNOSIS — I10 Essential (primary) hypertension: Secondary | ICD-10-CM | POA: Diagnosis not present

## 2016-09-18 DIAGNOSIS — R269 Unspecified abnormalities of gait and mobility: Secondary | ICD-10-CM | POA: Diagnosis not present

## 2016-09-18 DIAGNOSIS — R627 Adult failure to thrive: Secondary | ICD-10-CM | POA: Diagnosis not present

## 2016-09-18 LAB — BASIC METABOLIC PANEL
Anion gap: 14 (ref 5–15)
BUN: 26 mg/dL — ABNORMAL HIGH (ref 6–20)
CO2: 21 mmol/L — ABNORMAL LOW (ref 22–32)
Calcium: 9.1 mg/dL (ref 8.9–10.3)
Chloride: 102 mmol/L (ref 101–111)
Creatinine, Ser: 1.36 mg/dL — ABNORMAL HIGH (ref 0.44–1.00)
GFR calc Af Amer: 44 mL/min — ABNORMAL LOW (ref >60)
GFR calc non Af Amer: 38 mL/min — ABNORMAL LOW (ref >60)
Glucose, Bld: 182 mg/dL — ABNORMAL HIGH (ref 65–99)
Potassium: 4.5 mmol/L (ref 3.5–5.1)
Sodium: 137 mmol/L (ref 135–145)

## 2016-09-18 MED ORDER — SODIUM BICARBONATE 650 MG PO TABS
650.0000 mg | ORAL_TABLET | Freq: Two times a day (BID) | ORAL | Status: DC
  Administered 2016-09-18 – 2016-09-20 (×5): 650 mg via ORAL
  Filled 2016-09-18 (×5): qty 1

## 2016-09-18 MED ORDER — SENNOSIDES-DOCUSATE SODIUM 8.6-50 MG PO TABS
2.0000 | ORAL_TABLET | Freq: Two times a day (BID) | ORAL | Status: DC
  Administered 2016-09-18 – 2016-09-20 (×4): 2 via ORAL
  Filled 2016-09-18 (×4): qty 2

## 2016-09-18 MED ORDER — PNEUMOCOCCAL VAC POLYVALENT 25 MCG/0.5ML IJ INJ
0.5000 mL | INJECTION | INTRAMUSCULAR | Status: AC
  Administered 2016-09-19: 0.5 mL via INTRAMUSCULAR
  Filled 2016-09-18: qty 0.5

## 2016-09-18 MED ORDER — ENSURE ENLIVE PO LIQD
237.0000 mL | Freq: Two times a day (BID) | ORAL | Status: DC
Start: 2016-09-18 — End: 2016-09-20
  Administered 2016-09-18 – 2016-09-20 (×6): 237 mL via ORAL

## 2016-09-18 NOTE — Clinical Social Work Note (Signed)
Clinical Social Work Assessment  Patient Details  Name: Alyssa [name] MRN: [id number] Date of Birth: [birth year]  Date of referral:  09/18/16               Reason for consult:  Facility Placement                Permission sought to share information with:  Family Supports Permission granted to share information::  Yes, Verbal Permission Granted  Name::     Alyssa [name]  Agency::     Relationship::  Friend  Contact Information:  [phone]  Housing/Transportation Living arrangements for the past 2 months:  [place] of Information:  Patient Patient Interpreter Needed:  None Criminal Activity/Legal Involvement Pertinent to Current Situation/Hospitalization:  No - Comment as needed Significant Relationships:  Significant Other Lives with:  Significant Other Do you feel safe going back to the place where you live?  Yes Need for family participation in patient care:  Yes (Comment)  Care giving concerns:  Pt's "boyfriend" was at bedside. Pt's boyfriend denies any concerns regarding pt care at this time.  Social Worker assessment / plan:  CSW  Spoke with pt at bedside. Pt granted CSW verbal permission to speak while pt's friend was at bedside. Pt referred to this friend as her "boyfriend" when introducing him. Pt lives at home with her boyfriend. Pt is agreeable to SNF placement at this time. Pt unfamiliar with facilities in [place] and request that CSW send the referral to all facilities in [place]. CSW will present bed offers once available.   Employment status:  Unemployed Nurse, adult PT Recommendations:  [place] / Referral to community resources:  [place]  Patient/Family's Response to care:  Pt verbalized understanding of CSW role and expressed appreciation for support. Pt denies any questions concerns care at this time.  Patient/Family's Understanding of and Emotional Response to  Diagnosis, Current Treatment, and Prognosis:  Pt realistic and understanding of physical limitations. Pt is agreeable to SNF placement at this time. Pt denies any questions or concerns regarding treatment plan at this time.  Emotional Assessment Appearance:  Appears stated age Attitude/Demeanor/Rapport:   (Patient was appropriate) Affect (typically observed):  Accepting, Appropriate, Pleasant, Calm Orientation:  Oriented to Self, Oriented to Place, Oriented to  Time, Oriented to Situation Alcohol / Substance use:  Not Applicable Psych involvement (Current and /or in the community):  No (Comment)  Discharge Needs  Concerns to be addressed:  No discharge needs identified Readmission within the last 30 days:  No Current discharge risk:  Dependent with Mobility Barriers to Discharge:  Continued Medical Work up   [employer], LCSW 09/18/2016, 3:30 PM

## 2016-09-18 NOTE — Clinical Social Work Placement (Signed)
   CLINICAL SOCIAL WORK PLACEMENT  NOTE  Date:  09/18/2016  Patient Details  Name: Alyssa [name] MRN: [id number] Date of Birth: [birth year]  Clinical Social Work is seeking post-discharge placement for this patient at the [place] level of care (*CSW will initial, date and re-position this form in  chart as items are completed):      Patient/family provided with [place] Work Department's list of facilities offering this level of care within the geographic area requested by the patient (or if unable, by the patient's family).      Patient/family informed of their freedom to choose among providers that offer the needed level of care, that participate in Medicare, Medicaid or managed care program needed by the patient, have an available bed and are willing to accept the patient.      Patient/family informed of [facility]'s ownership interest in [facility] and [facility], as well as of the fact that they are under no obligation to receive care at these facilities.  PASRR submitted to EDS on       PASRR number received on 09/18/16     Existing PASRR number confirmed on       FL2 transmitted to all facilities in geographic area requested by pt/family on 09/18/16     FL2 transmitted to all facilities within larger geographic area on       Patient informed that his/her managed care company has contracts with or will negotiate with certain facilities, including the following:            Patient/family informed of bed offers received.  Patient chooses bed at       Physician recommends and patient chooses bed at      Patient to be transferred to   on  .  Patient to be transferred to facility by       Patient family notified on   of transfer.  Name of family member notified:        PHYSICIAN Please prepare priority discharge summary, including medications, Please prepare prescriptions, Please sign FL2     Additional Comment:     _______________________________________________ Alla German, LCSW 09/18/2016, 3:35 PM

## 2016-09-18 NOTE — NC FL2 (Signed)
[place] LEVEL OF CARE SCREENING TOOL     IDENTIFICATION  [name] Birthdate: 01-04-1941 Sex: female Admission Date (Current Location): 09/17/2016  [facility] and [place] Number:  Herbalist and Address:  [facility] [place] [address], [place], [place] [address]      Provider Number: [id number]  Attending Physician Name and Address:  Florencia Reasons, MD  Relative Name and Phone Number:       Current Level of Care: Hospital Recommended Level of Care: [place] Prior Approval Number:    Date Approved/Denied: 09/18/16 PASRR Number: [id number]  Discharge Plan: SNF    Current Diagnoses: Patient Active Problem List   Diagnosis Date Noted  . Fall 09/17/2016  . Fracture of humeral head, closed, right, initial encounter 09/17/2016  . Rib fractures 09/17/2016  . Multiple falls 09/17/2016  . Severe muscle deconditioning 09/17/2016  . Failure to thrive in adult 09/17/2016  . Melena 04/25/2016  . Acute kidney injury ([place]) 04/25/2016  . Essential hypertension 04/25/2016  . Anxiety state 04/25/2016  . Tobacco abuse 04/25/2016  . Hyperlipidemia 04/25/2016  . Syncope 04/24/2016  . Syncope and collapse 04/24/2016  . Gait difficulty 01/12/2016  . Foot drop, left 01/12/2016  . Severe recurrent major depression without psychotic features ([place]) 08/28/2015  . Spondylolisthesis at L4-L5 level 07/30/2015  . PVD (peripheral vascular disease) with claudication ([place]) 07/29/2014  . Weakness-Bilateral arm/leg 07/29/2014  . Numbness-left leg 07/29/2014  . Swelling of limb-Legs 07/29/2014  . Atherosclerosis of native arteries of the extremities with intermittent claudication 09/30/2011    Orientation RESPIRATION BLADDER Height & Weight     Self, Time, Situation, Place  Normal Continent Weight: 96 lb 1.9 oz (43.6 kg) Height:  4\' 9"  (144.8 cm)  BEHAVIORAL SYMPTOMS/MOOD NEUROLOGICAL BOWEL NUTRITION STATUS   Continent Diet (Soft diet, thin liquids *Please see discharge summary)  AMBULATORY STATUS COMMUNICATION OF NEEDS Skin   Limited Assist (Moderate assist) Verbally Normal                       Personal Care Assistance Level of Assistance  Bathing, Feeding, Dressing Bathing Assistance: Limited assistance Feeding assistance: Independent Dressing Assistance: Limited assistance     Functional Limitations Info  Sight, Hearing, Speech Sight Info: Adequate Hearing Info: Adequate Speech Info: Adequate    SPECIAL CARE FACTORS FREQUENCY  PT (By licensed PT), OT (By licensed OT)     PT Frequency: min 3x week OT Frequency: min 3x week            Contractures Contractures Info: Not present    Additional Factors Info  Code Status, Allergies Code Status Info: Full Allergies Info: No known allergies           Current Medications (09/18/2016):  This is the current hospital active medication list Current Facility-Administered Medications  Medication Dose Route Frequency Provider Last Rate Last Dose  . 0.45 % sodium chloride infusion   Intravenous Continuous Roney Jaffe, MD 65 mL/hr at 09/18/16 1345    . acetaminophen (TYLENOL) tablet 650 mg  650 mg Oral Q6H PRN Roney Jaffe, MD      . amLODipine (NORVASC) tablet 10 mg  10 mg Oral Daily Roney Jaffe, MD   10 mg at 09/18/16 1116  . busPIRone (BUSPAR) tablet 15 mg  15 mg Oral Daily Roney Jaffe, MD   15 mg at 09/18/16 1115  . celecoxib (CELEBREX) capsule 100 mg  100 mg Oral  BID PRN Roney Jaffe, MD      . clonazePAM Bobbye [place]) tablet 2 mg  2 mg Oral QHS Roney Jaffe, MD   2 mg at 09/17/16 2202  . diphenhydrAMINE (BENADRYL) capsule 25 mg  25 mg Oral Q6H PRN Roney Jaffe, MD      . enoxaparin (LOVENOX) injection 30 mg  30 mg Subcutaneous Q24H Roney Jaffe, MD   30 mg at 09/17/16 2207  . feeding supplement (ENSURE ENLIVE) (ENSURE ENLIVE) liquid 237 mL  237 mL Oral BID BM Roney Jaffe, MD   237 mL at 09/18/16 1116  .  ferrous sulfate tablet 325 mg  325 mg Oral Q breakfast Roney Jaffe, MD   325 mg at 09/18/16 0854  . gabapentin (NEURONTIN) capsule 100 mg  100 mg Oral TID Roney Jaffe, MD   100 mg at 09/18/16 1115  . irbesartan (AVAPRO) tablet 300 mg  300 mg Oral Daily Roney Jaffe, MD   300 mg at 09/18/16 1115  . multivitamin with minerals tablet 1 tablet  1 tablet Oral Daily Roney Jaffe, MD   1 tablet at 09/18/16 1115  . nicotine (NICODERM CQ - dosed in mg/24 hours) patch 21 mg  21 mg Transdermal Daily Ritta Slot, NP   21 mg at 09/18/16 1114  . ondansetron (ZOFRAN) tablet 4 mg  4 mg Oral Q6H PRN Roney Jaffe, MD       Or  . ondansetron [facility]) injection 4 mg  4 mg Intravenous Q6H PRN Roney Jaffe, MD      . oxyCODONE (Oxy IR/ROXICODONE) immediate release tablet 5 mg  5 mg Oral BID PRN Roney Jaffe, MD   5 mg at 09/17/16 2326  . pantoprazole (PROTONIX) EC tablet 40 mg  40 mg Oral BID Roney Jaffe, MD   40 mg at 09/18/16 1116  . [START ON 09/19/2016] pneumococcal 23 valent vaccine (PNU-IMMUNE) injection 0.5 mL  0.5 mL Intramuscular Tomorrow-1000 Roney Jaffe, MD      . polyethylene glycol (MIRALAX / GLYCOLAX) packet 17 g  17 g Oral Daily PRN Roney Jaffe, MD      . predniSONE (DELTASONE) tablet 20 mg  20 mg Oral Daily Roney Jaffe, MD   20 mg at 09/18/16 1116  . rosuvastatin (CRESTOR) tablet 10 mg  10 mg Oral Daily Roney Jaffe, MD   10 mg at 09/18/16 1345  . sodium bicarbonate tablet 650 mg  650 mg Oral BID Florencia Reasons, MD   650 mg at 09/18/16 1115  . traZODone (DESYREL) tablet 50 mg  50 mg Oral QHS PRN Roney Jaffe, MD         Discharge Medications: Please see discharge summary for a list of discharge medications.  Relevant Imaging Results:  Relevant Lab Results:   Additional Information SSN: [id number]  Alla German, LCSW

## 2016-09-18 NOTE — Progress Notes (Signed)
Patient arrived to floor from ER and assisted to bed by nursing staff.Patient alert and oriented son at bedside.Denies discomfort at present time.Patient instructed on call bell usage and high fall risk status.Patient and son verbalize understanding.Will continue to monitor patient.
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
## 2016-09-18 NOTE — Progress Notes (Signed)
Initial Nutrition Assessment  DOCUMENTATION CODES:  Not applicable  INTERVENTION:  Pt reports she has hx of constipation and took stool softener at home. Would consider starting bowel regimen, especially with PO iron.   Ensure Enlive po BID, each supplement provides 350 kcal and 20 grams of protein   NUTRITION DIAGNOSIS:  Increased nutrient needs related to Fx healing as evidenced by estimated nutritional requirements for this condition  GOAL:  Patient will meet greater than or equal to 90% of their needs  MONITOR:  PO intake, Supplement acceptance, Labs  REASON FOR ASSESSMENT:  Malnutrition Screening Tool    ASSESSMENT:  HTN, HLD, DJD, S/p multiple hip surgeries that have led to impaired gait/recurrent falls. Presents with shoulder pain after falling. Worked up for R humeral head fx and rib fxs.   Pt reports that her appetite varies day to day. She will sometimes eat "only toast and coffee" and other days she will eat a few meals. She denies her trouble walking having any consequence on her nutrition. She says she took a mvi at home. She drank 2 Ensure supplements per day,.   Denies n/v/d. Does report constipation and takes a stool softener at home.   She reports her UBW as 98 lbs. Per chart review, her weight appears to be stable for atleast the past year.   Physical exam reveals mild temporal and clavicular muscle loss.   Labs: Glu: 182, WBC:11.8 Medications: MVI with min, Klonopin, mvi with min, ppi, prednisone, sodium bicarb   Recent Labs Lab 09/17/16 1313 09/18/16 0555  NA 138 137  K 4.1 4.5  CL 105 102  CO2 20* 21*  BUN 21* 26*  CREATININE 1.37* 1.36*  CALCIUM 9.7 9.1  GLUCOSE 127* 182*    Diet Order:  DIET SOFT Room service appropriate? Yes; Fluid consistency: Thin  Skin:  Reviewed, no issues  Last BM:  12/1  Height:  Ht Readings from Last 1 Encounters:  09/17/16 4\' 9"  (1.448 m)   Weight:  Wt Readings from Last 1 Encounters:  09/17/16 96 lb 1.9  oz (43.6 kg)   Wt Readings from Last 10 Encounters:  09/17/16 96 lb 1.9 oz (43.6 kg)  08/31/16 96 lb 12 oz (43.9 kg)  05/31/16 95 lb 12 oz (43.4 kg)  04/26/16 96 lb 3.2 oz (43.6 kg)  03/26/16 93 lb (42.2 kg)  01/12/16 97 lb (44 kg)  12/28/15 97 lb 3 oz (44.1 kg)  08/26/15 98 lb 11.2 oz (44.8 kg)  08/24/15 92 lb (41.7 kg)  07/30/15 91 lb 7.9 oz (41.5 kg)   Ideal Body Weight:  43.2 kg  BMI:  Body mass index is 20.8 kg/m.  Estimated Nutritional Needs:  Kcal:  1300-1500 kcals (30-35 kcal/kg bw) Protein:  60-70 g Pro (1.4-1.6 g/kg bw) Fluid:  >1.3 L (30 ml/kg bw)  EDUCATION NEEDS:  No education needs identified at this time  Burtis Junes RD, LDN, [facility] Pager: [phone] 09/18/2016 10:09 AM

## 2016-09-19 DIAGNOSIS — Z7952 Long term (current) use of systemic steroids: Secondary | ICD-10-CM | POA: Diagnosis not present

## 2016-09-19 DIAGNOSIS — I739 Peripheral vascular disease, unspecified: Secondary | ICD-10-CM | POA: Diagnosis present

## 2016-09-19 DIAGNOSIS — Z23 Encounter for immunization: Secondary | ICD-10-CM | POA: Diagnosis present

## 2016-09-19 DIAGNOSIS — Z79899 Other long term (current) drug therapy: Secondary | ICD-10-CM | POA: Diagnosis not present

## 2016-09-19 DIAGNOSIS — G8929 Other chronic pain: Secondary | ICD-10-CM | POA: Diagnosis present

## 2016-09-19 DIAGNOSIS — R627 Adult failure to thrive: Secondary | ICD-10-CM | POA: Diagnosis present

## 2016-09-19 DIAGNOSIS — S42291A Other displaced fracture of upper end of right humerus, initial encounter for closed fracture: Secondary | ICD-10-CM | POA: Diagnosis present

## 2016-09-19 DIAGNOSIS — Z9889 Other specified postprocedural states: Secondary | ICD-10-CM | POA: Diagnosis not present

## 2016-09-19 DIAGNOSIS — R296 Repeated falls: Secondary | ICD-10-CM | POA: Diagnosis present

## 2016-09-19 DIAGNOSIS — F332 Major depressive disorder, recurrent severe without psychotic features: Secondary | ICD-10-CM | POA: Diagnosis present

## 2016-09-19 DIAGNOSIS — Z809 Family history of malignant neoplasm, unspecified: Secondary | ICD-10-CM | POA: Diagnosis not present

## 2016-09-19 DIAGNOSIS — R269 Unspecified abnormalities of gait and mobility: Secondary | ICD-10-CM | POA: Diagnosis not present

## 2016-09-19 DIAGNOSIS — F17210 Nicotine dependence, cigarettes, uncomplicated: Secondary | ICD-10-CM | POA: Diagnosis present

## 2016-09-19 DIAGNOSIS — I10 Essential (primary) hypertension: Secondary | ICD-10-CM | POA: Diagnosis present

## 2016-09-19 DIAGNOSIS — Z96641 Presence of right artificial hip joint: Secondary | ICD-10-CM | POA: Diagnosis present

## 2016-09-19 DIAGNOSIS — M21372 Foot drop, left foot: Secondary | ICD-10-CM | POA: Diagnosis present

## 2016-09-19 DIAGNOSIS — W19XXXA Unspecified fall, initial encounter: Secondary | ICD-10-CM | POA: Diagnosis present

## 2016-09-19 DIAGNOSIS — Z8249 Family history of ischemic heart disease and other diseases of the circulatory system: Secondary | ICD-10-CM | POA: Diagnosis not present

## 2016-09-19 DIAGNOSIS — N289 Disorder of kidney and ureter, unspecified: Secondary | ICD-10-CM | POA: Diagnosis present

## 2016-09-19 LAB — COMPREHENSIVE METABOLIC PANEL
ALT: 12 U/L — ABNORMAL LOW (ref 14–54)
AST: 18 U/L (ref 15–41)
Albumin: 2.5 g/dL — ABNORMAL LOW (ref 3.5–5.0)
Alkaline Phosphatase: 114 U/L (ref 38–126)
Anion gap: 8 (ref 5–15)
BUN: 33 mg/dL — ABNORMAL HIGH (ref 6–20)
CO2: 23 mmol/L (ref 22–32)
Calcium: 9.2 mg/dL (ref 8.9–10.3)
Chloride: 104 mmol/L (ref 101–111)
Creatinine, Ser: 1.4 mg/dL — ABNORMAL HIGH (ref 0.44–1.00)
GFR calc Af Amer: 42 mL/min — ABNORMAL LOW (ref >60)
GFR calc non Af Amer: 36 mL/min — ABNORMAL LOW (ref >60)
Glucose, Bld: 140 mg/dL — ABNORMAL HIGH (ref 65–99)
Potassium: 4.9 mmol/L (ref 3.5–5.1)
Sodium: 135 mmol/L (ref 135–145)
Total Bilirubin: 0.1 mg/dL — ABNORMAL LOW (ref 0.3–1.2)
Total Protein: 6.1 g/dL — ABNORMAL LOW (ref 6.5–8.1)

## 2016-09-19 LAB — CBC WITH DIFFERENTIAL/PLATELET
Basophils Absolute: 0 10*3/uL (ref 0.0–0.1)
Basophils Relative: 0 %
Eosinophils Absolute: 0 10*3/uL (ref 0.0–0.7)
Eosinophils Relative: 0 %
HCT: 28.8 % — ABNORMAL LOW (ref 36.0–46.0)
Hemoglobin: 9.8 g/dL — ABNORMAL LOW (ref 12.0–15.0)
Lymphocytes Relative: 14 %
Lymphs Abs: 1.6 10*3/uL (ref 0.7–4.0)
MCH: 26.9 pg (ref 26.0–34.0)
MCHC: 34 g/dL (ref 30.0–36.0)
MCV: 79.1 fL (ref 78.0–100.0)
Monocytes Absolute: 1.1 10*3/uL — ABNORMAL HIGH (ref 0.1–1.0)
Monocytes Relative: 10 %
Neutro Abs: 8.4 10*3/uL — ABNORMAL HIGH (ref 1.7–7.7)
Neutrophils Relative %: 76 %
Platelets: 396 10*3/uL (ref 150–400)
RBC: 3.64 MIL/uL — ABNORMAL LOW (ref 3.87–5.11)
RDW: 14.3 % (ref 11.5–15.5)
WBC: 11.1 10*3/uL — ABNORMAL HIGH (ref 4.0–10.5)

## 2016-09-19 NOTE — Clinical Social Work Note (Signed)
Clinical Social Worker continuing to follow patient and family for support and discharge planning needs.  CSW met with patient at bedside to provide bed offers.  Patient has accepted a bed offer from [facility].  CSW left message with admissions coordinator at the facility to confirm bed offer and determine initiation for insurance authorization.  CSW spoke with patient son, [name], over the phone per patient request and provided full update for discharge plan moving forward.  Patient son states that he will likely transport patient at discharge but would like to continue to leave the possibility for ambulance transport on the table.  CSW remains available for support and to facilitate patient discharge needs once medically stable.  Barbette Or, [place]
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
## 2016-09-20 LAB — HEMOGLOBIN A1C
Hgb A1c MFr Bld: 5.9 % — ABNORMAL HIGH (ref 4.8–5.6)
Mean Plasma Glucose: 123 mg/dL

## 2016-09-20 MED ORDER — SENNOSIDES-DOCUSATE SODIUM 8.6-50 MG PO TABS: 2.0000 | ORAL_TABLET | Freq: Every day | ORAL | 0 refills | Status: DC

## 2016-09-20 MED ORDER — ENSURE ENLIVE PO LIQD: 237.0000 mL | Freq: Two times a day (BID) | ORAL | 12 refills | Status: DC

## 2016-09-20 MED ORDER — CLONAZEPAM 2 MG PO TABS: 2.0000 mg | ORAL_TABLET | Freq: Every day | ORAL | 0 refills | Status: DC

## 2016-09-20 MED ORDER — SODIUM BICARBONATE 650 MG PO TABS: 650.0000 mg | ORAL_TABLET | Freq: Every day | ORAL | 0 refills | Status: DC

## 2016-09-20 MED ORDER — OXYCODONE HCL 5 MG PO TABS: 5.0000 mg | ORAL_TABLET | Freq: Two times a day (BID) | ORAL | 0 refills | Status: DC | PRN

## 2016-09-20 MED ORDER — FERROUS SULFATE 325 (65 FE) MG PO TABS: 325.0000 mg | ORAL_TABLET | ORAL | 0 refills | Status: DC

## 2016-09-20 NOTE — Progress Notes (Signed)
Pt's son voiced that his mom will need transportation to SNF at discharge. He says he is not comfortable to transport her. Called Education officer, museum but call went to voicemail. MD said pt will be discharged tomorrow
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
## 2016-09-20 NOTE — Progress Notes (Addendum)
SW advised pt discharging to [facility]. SW made arrangements for non-emergency transport. SW called and left message to son regarding pt discharge. Discharge summaries discussed to pt. Pt understood with no questions asked.Report given to facility`s nurse Janett Billow. @2038  Non-emergency transport here to transfer pt to [facility] care.
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
## 2016-09-20 NOTE — Clinical Social Work Note (Addendum)
Patient in need of short-term rehab and [facility] chosen. Facility sent d/c paperwork and insurance authorization was initiated by facility admissions Mudlogger. CSW talked with Santiago Glad, admissions director at 5:33 pm regarding patient's discharge and accepting patient today with LOG and learned they received insurance authorization after 5 pm per Santiago Glad, however the bed for patient is not yet vacant as the resident is at dialysis.    CSW contacted Santiago Glad later and patient will discharge this evening to [facility] as patient's room is being cleaned and will be ready by the time patient arrives. CSW did talk with patient's [name] and infomred him of discharge this evening to [facility].   Garnette Greb Givens, MSW, LCSW Licensed Clinical Social Worker [place] [phone]

## 2016-09-20 NOTE — Clinical Social Work Placement (Signed)
   CLINICAL SOCIAL WORK PLACEMENT  NOTE 09/21/16 - DISCHARGED TO [place] VIA AMBULANCE  Date:  09/20/2016  Patient Details  Name: Alyssa [name] MRN: [id number] Date of Birth: Nov 04, 1940  Clinical Social Work is seeking post-discharge placement for this patient at the [place] level of care (*CSW will initial, date and re-position this form in  chart as items are completed):      Patient/family provided with [employer] Work Department's list of facilities offering this level of care within the geographic area requested by the patient (or if unable, by the patient's family).      Patient/family informed of their freedom to choose among providers that offer the needed level of care, that participate in Medicare, Medicaid or managed care program needed by the patient, have an available bed and are willing to accept the patient.      Patient/family informed of [facility]'s ownership interest in [facility] and [facility], as well as of the fact that they are under no obligation to receive care at these facilities.  PASRR submitted to EDS on       PASRR number received on 09/18/16     Existing PASRR number confirmed on       FL2 transmitted to all facilities in geographic area requested by pt/family on 09/18/16     FL2 transmitted to all facilities within larger geographic area on       Patient informed that his/her managed care company has contracts with or will negotiate with certain facilities, including the following:         YES - Patient/family informed of bed offers received.  Patient chooses bed at  [facility].     Physician recommends and patient chooses bed at      Patient to be transferred to  [facility] on  09/20/16.  Patient to be transferred to facility by  ambulance     Patient family notified on  09/20/16 of transfer.  Name of family member notified:    Son, Ambry Dix    PHYSICIAN Please prepare priority discharge summary, including medications, Please prepare prescriptions, Please sign FL2     Additional Comment:    _______________________________________________ Sable Feil, LCSW 09/20/2016, 11:06 PM
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
## 2016-09-22 IMAGING — CR DG KNEE COMPLETE 4+V*L*
4 series · 4 of 4 positions shown · non-contrast
Comparison: 02/04/2015

CLINICAL DATA: Left knee pain/ swelling, chronic deformity, status
post fluid removal

EXAM:
LEFT KNEE - COMPLETE 4+ VIEW

[knee ap]
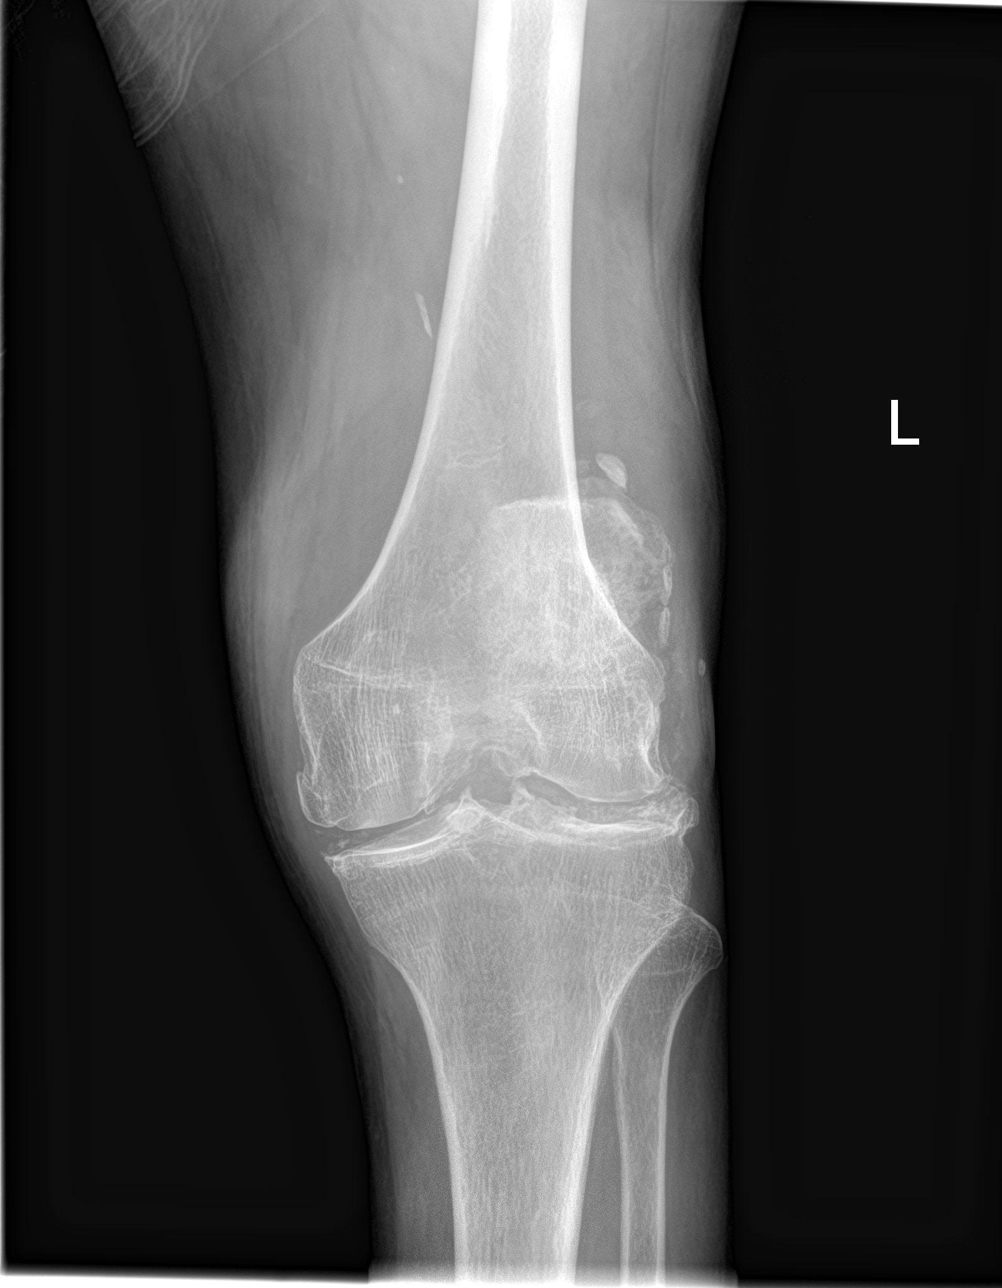

[tunnel]
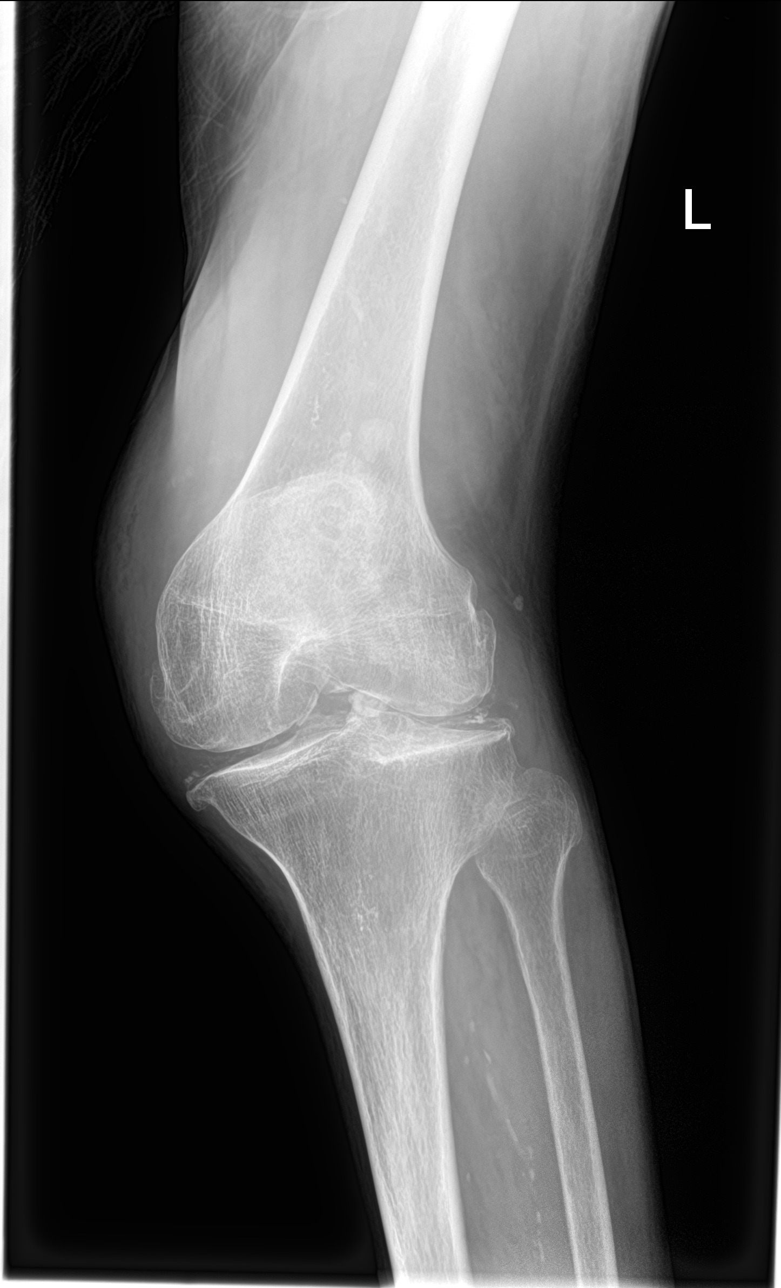

[knee lat]
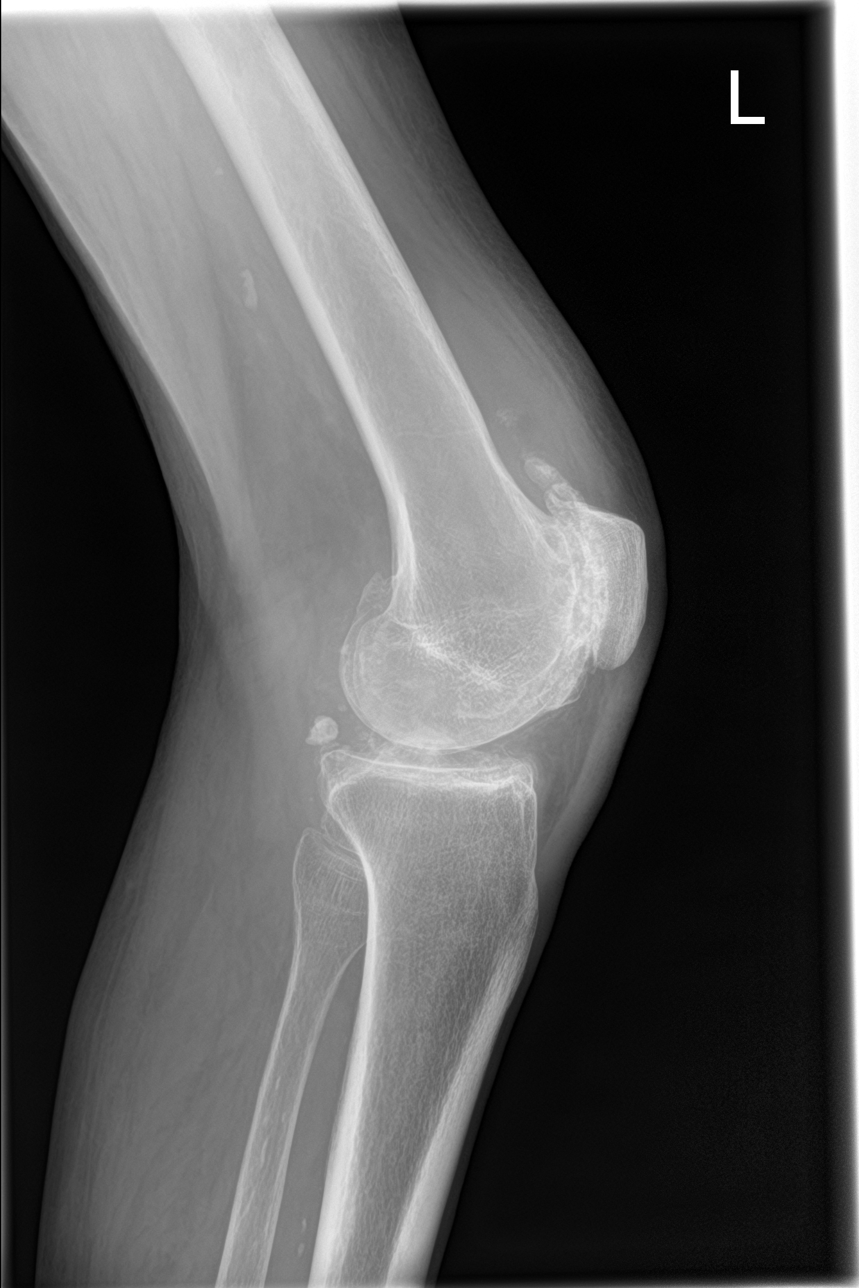

[knee obl]
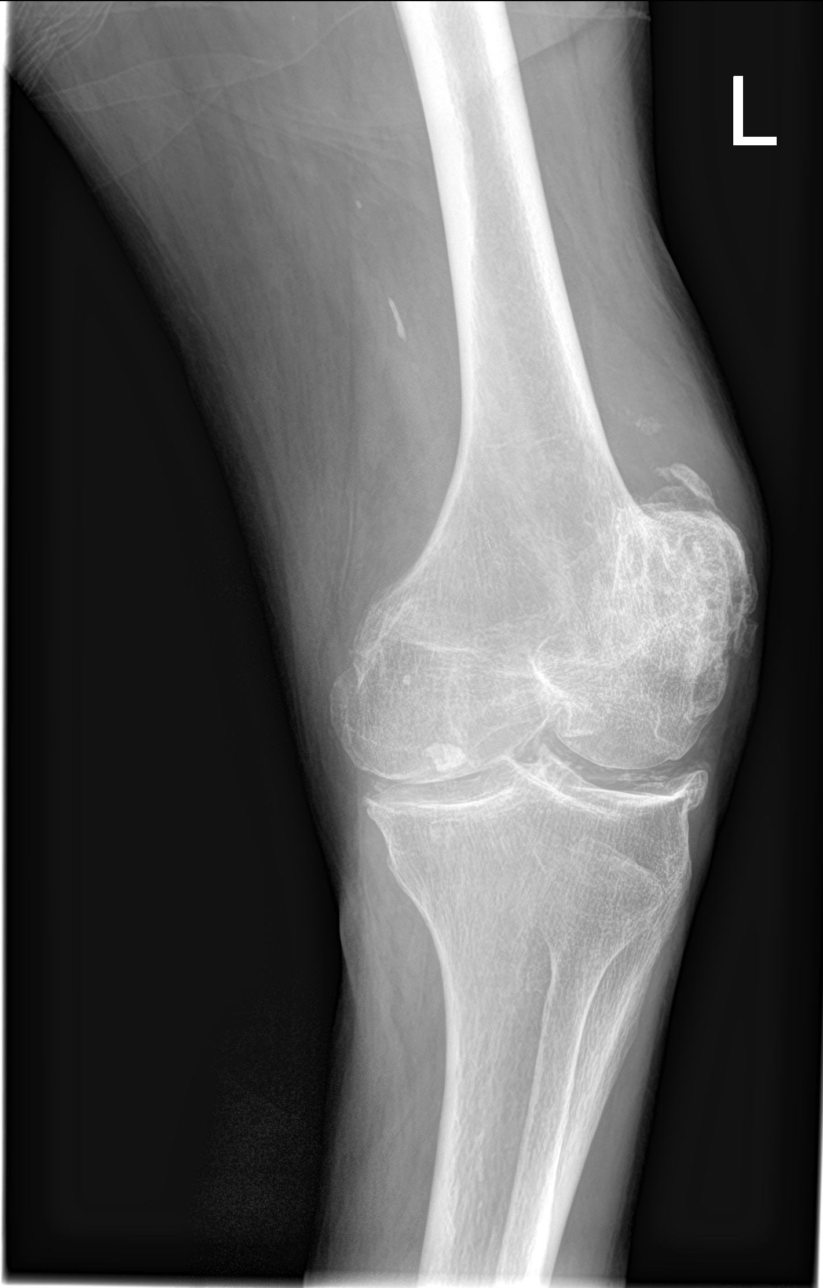

[4 of 4 positions shown; findings below may reference images not displayed]

FINDINGS: No fracture or dislocation is seen.

Moderate to severe tricompartmental degenerative changes, most
prominent in the patellofemoral compartment. Associated
chondrocalcinosis in the medial and lateral compartments.

Moderate suprapatellar knee joint effusion.
IMPRESSION: Moderate to severe tricompartmental degenerative changes.

Moderate suprapatellar knee joint effusion.

No fracture or dislocation is seen.

## 2016-10-01 ENCOUNTER — Ambulatory Visit (INDEPENDENT_AMBULATORY_CARE_PROVIDER_SITE_OTHER): Payer: Medicare HMO | Admitting: Orthopaedic Surgery

## 2016-10-01 ENCOUNTER — Encounter (INDEPENDENT_AMBULATORY_CARE_PROVIDER_SITE_OTHER): Payer: Self-pay | Admitting: Orthopaedic Surgery

## 2016-10-01 DIAGNOSIS — S42291A Other displaced fracture of upper end of right humerus, initial encounter for closed fracture: Secondary | ICD-10-CM | POA: Diagnosis not present

## 2016-10-01 IMAGING — DX DG CHEST 2V
2 series · 2 of 2 positions shown · non-contrast
Comparison: Chest radiograph 12/17/2014

CLINICAL DATA: Patient with upper chest pain, cough and shortness
of breath.

EXAM:
CHEST  2 VIEW

[chest lat]
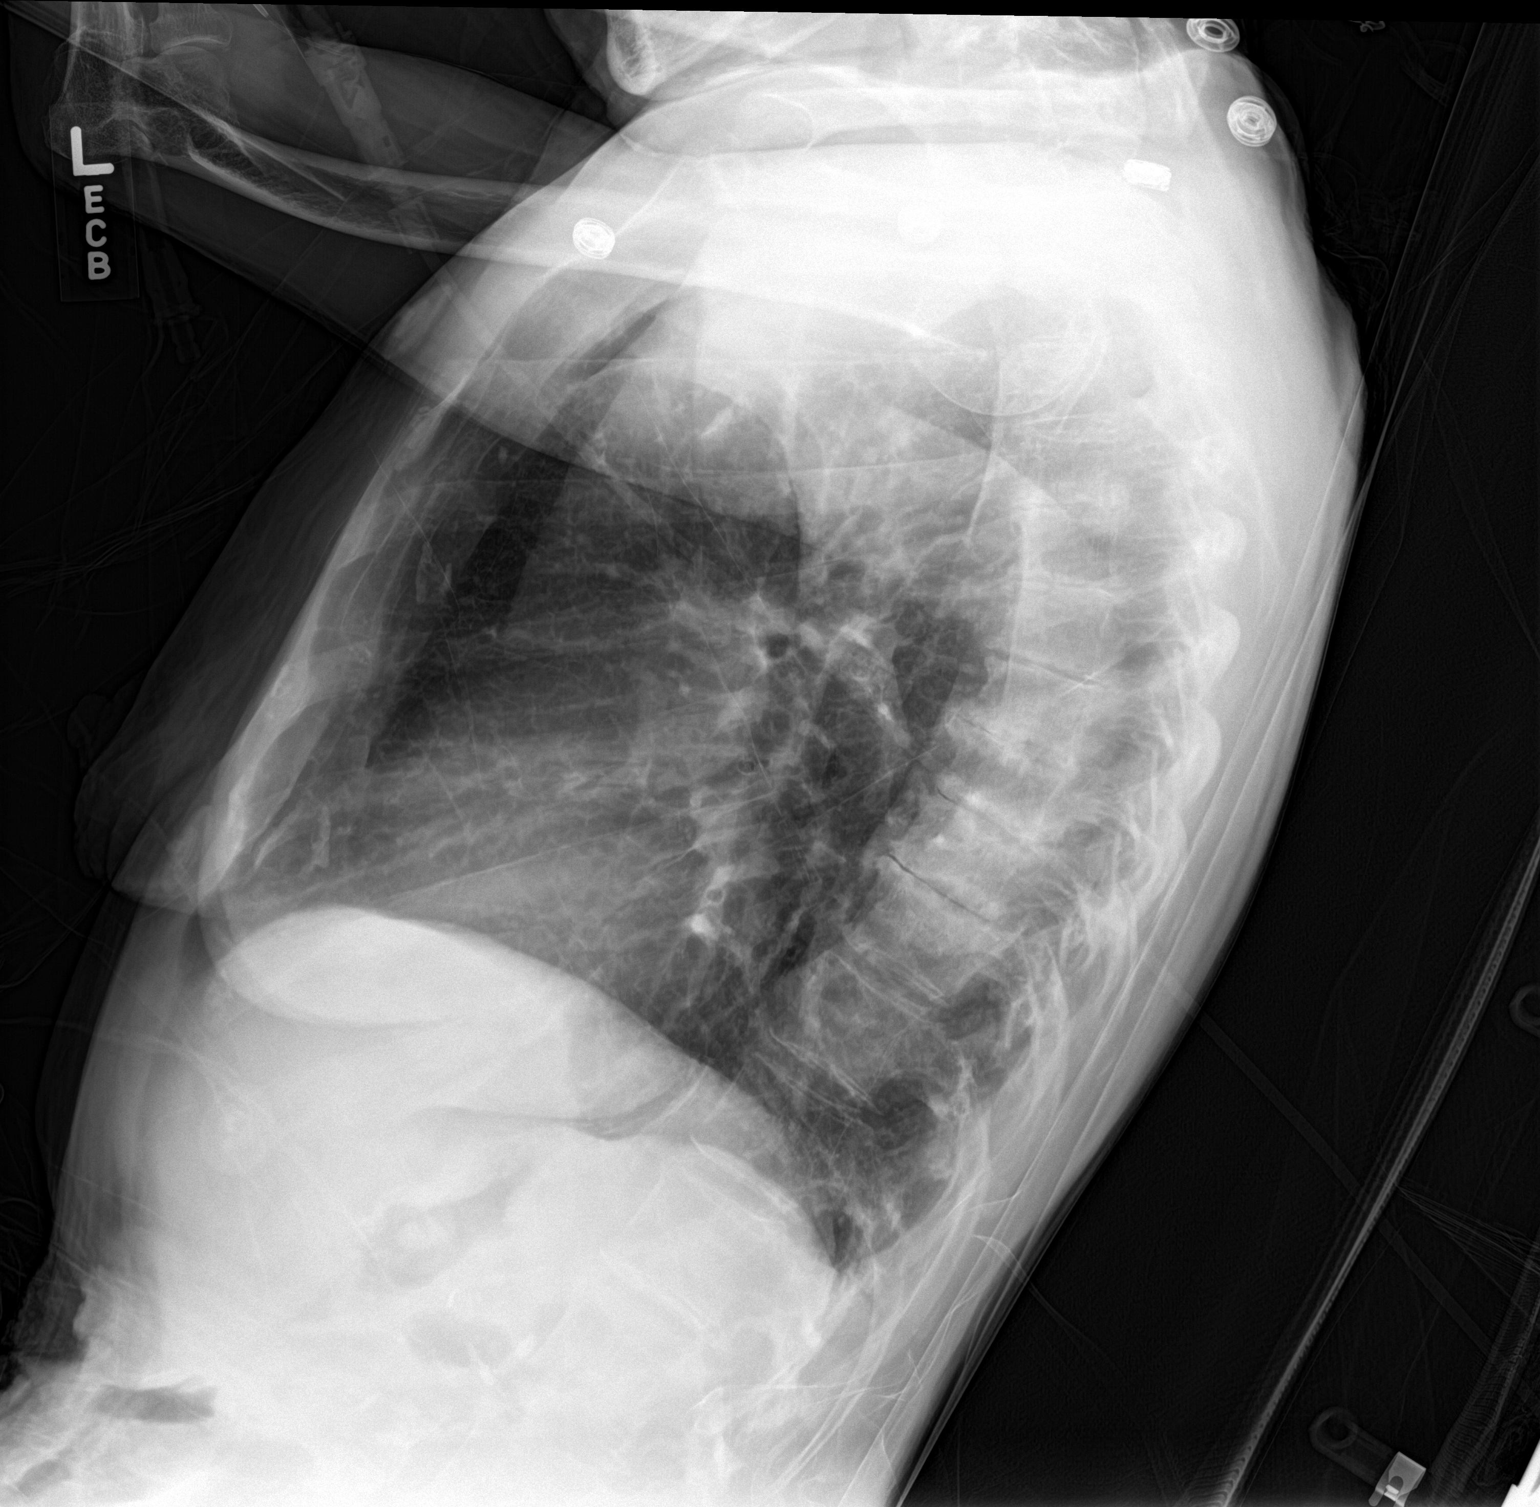

[chest ap]
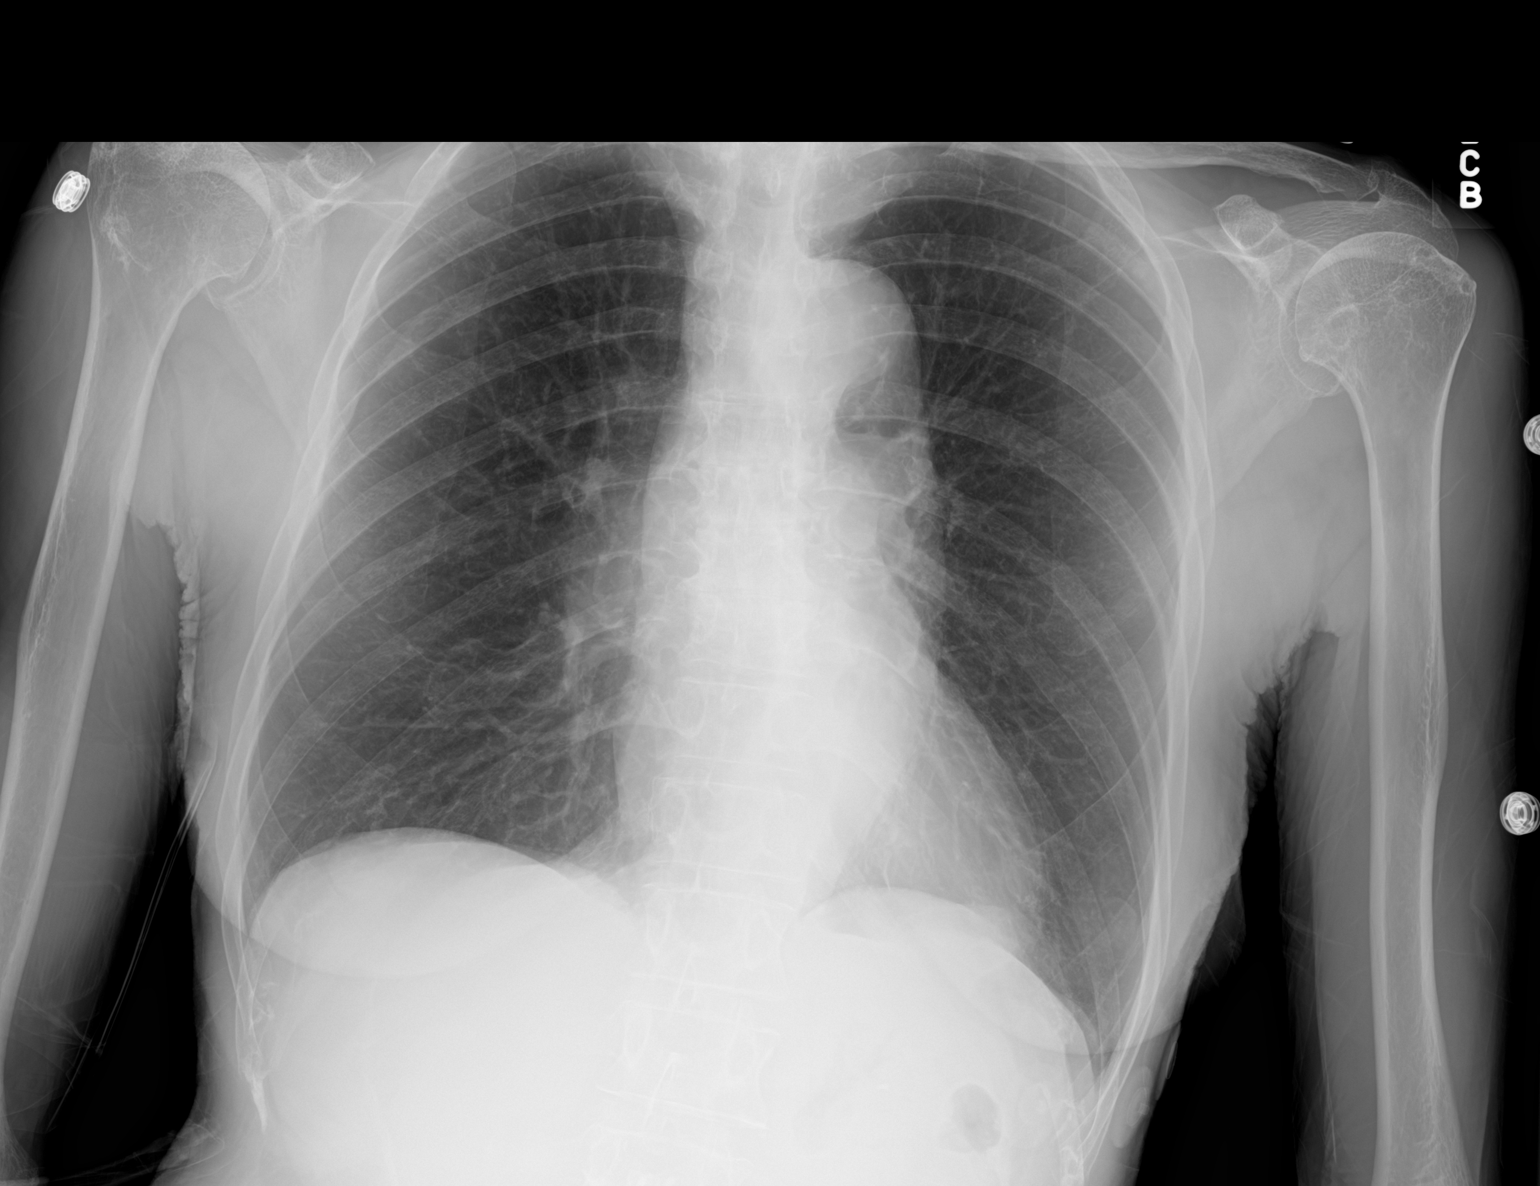

[2 of 2 positions shown; findings below may reference images not displayed]

FINDINGS: Stable cardiac and mediastinal contours. No consolidative pulmonary
opacities. No pleural effusion or pneumothorax. Stable biapical
pleural parenchymal thickening. Bilateral shoulder joint
degenerative changes. Re- demonstrated tortuosity of the thoracic
aorta. Mid thoracic spine degenerative changes.
IMPRESSION: No acute cardiopulmonary process.

## 2016-10-01 MED ORDER — OXYCODONE HCL 5 MG PO TABS: 5.0000 mg | ORAL_TABLET | Freq: Two times a day (BID) | ORAL | 0 refills | Status: DC | PRN

## 2016-10-01 NOTE — Progress Notes (Signed)
Office Visit Note   Patient: HAZELEE [name]           Date of Birth: 09/16/1941           MRN: [id number] Visit Date: 10/01/2016              Requested by: Everardo Beals, NP [facility] [address] [place], [place] [address] PCP: [name], NP   Assessment & Plan: Visit Diagnoses:  1. Fracture of humeral head, closed, right, initial encounter     Plan: I reviewed x-rays from [facility] and on the events that she truly has a fracture. I'm going to set up with home health physical therapy for mobilization like to see her back in 4 weeks with repeat 2 view x-rays of the right shoulder  Follow-Up Instructions: Return in about 4 weeks (around 10/29/2016) for recheck rigth shoulder.   Orders:  No orders of the defined types were placed in this encounter.  Meds ordered this encounter  Medications  . oxyCODONE (OXY IR/ROXICODONE) 5 MG immediate release tablet    Sig: Take 1 tablet (5 mg total) by mouth 2 (two) times daily as needed.    Dispense:  10 tablet    Refill:  0      Procedures: No procedures performed   Clinical Data: No additional findings.   Subjective: Chief Complaint  Patient presents with  . Right Shoulder - Pain    Patient follows up today from the hospital for right shoulder pain from a fall. X-rays showed a subacute humeral head fracture. She complains today moderate pain is worse with movement of the arm and radiation down the arm. She's been wearing his sling. She's been taking oxycodone. Denies constitutional symptoms.    Review of Systems Complete review of systems Negative except for history of present illness  Objective: Vital Signs: There were no vitals taken for this visit.  Physical Exam Well-developed well-nourished no acute distress alert 3 nonlabored breathing normal judgments affect the Ortho Exam Exam of the right shoulder shows tenderness palpation of the humeral head. Acromioclavicular joint is  nontender. She has good active movement and strength of her rotator cuff. Specialty Comments:  No specialty comments available.  Imaging: No results found.   PMFS History: Patient Active Problem List   Diagnosis Date Noted  . Fall 09/17/2016  . Fracture of humeral head, closed, right, initial encounter 09/17/2016  . Rib fractures 09/17/2016  . Multiple falls 09/17/2016  . Severe muscle deconditioning 09/17/2016  . Failure to thrive in adult 09/17/2016  . Melena 04/25/2016  . Acute kidney injury ([place]) 04/25/2016  . Essential hypertension 04/25/2016  . Anxiety state 04/25/2016  . Tobacco abuse 04/25/2016  . Hyperlipidemia 04/25/2016  . Syncope 04/24/2016  . Syncope and collapse 04/24/2016  . Gait difficulty 01/12/2016  . Foot drop, left 01/12/2016  . Severe recurrent major depression without psychotic features ([place]) 08/28/2015  . Spondylolisthesis at L4-L5 level 07/30/2015  . PVD (peripheral vascular disease) with claudication ([place]) 07/29/2014  . Weakness-Bilateral arm/leg 07/29/2014  . Numbness-left leg 07/29/2014  . Swelling of limb-Legs 07/29/2014  . Atherosclerosis of native arteries of the extremities with intermittent claudication 09/30/2011   Past Medical History:  Diagnosis Date  . Anxiety   . Arthritis   . Headache(784.0)   . Hyperlipidemia   . Hypertension   . Joint pain   . Leg pain   . Peripheral neuropathy ([place])   . Peripheral vascular disease ([place])  Family History  Problem Relation Age of Onset  . Heart attack Mother   . Heart disease Mother   . Hyperlipidemia Mother   . Hypertension Mother   . Heart attack Father   . Heart disease Father     Before age 50  . Hyperlipidemia Father   . Hypertension Father   . Cancer Sister   . Aneurysm Brother   . Cancer Brother     Past Surgical History:  Procedure Laterality Date  . ABDOMINAL ANGIOGRAM N/A 10/29/2011   Procedure: ABDOMINAL ANGIOGRAM;  Surgeon: Elam Dutch, MD;  Location: [facility] CATH  LAB;  Service: Cardiovascular;  Laterality: N/A;  . BACK SURGERY    . FEMORAL-FEMORAL BYPASS GRAFT  08/31/2010  . JOINT REPLACEMENT Right 2012   Hip  . TOTAL HIP ARTHROPLASTY     right   Social History   Occupational History  . Retired    Social History Main Topics  . Smoking status: Current Every Day Smoker    Packs/day: 0.50    Types: Cigarettes    Last attempt to quit: 01/27/2014  . Smokeless tobacco: Never Used  . Alcohol use No  . Drug use: No  . Sexual activity: Not on file

## 2016-10-15 ENCOUNTER — Telehealth (INDEPENDENT_AMBULATORY_CARE_PROVIDER_SITE_OTHER): Payer: Self-pay | Admitting: Orthopaedic Surgery

## 2016-10-15 NOTE — Telephone Encounter (Signed)
AAROM, progressive strengthening

## 2016-10-15 NOTE — Telephone Encounter (Signed)
Therapy wondering what protocol for her is

## 2016-10-15 NOTE — Telephone Encounter (Signed)
Pt requesting pain medication refill oxycodone

## 2016-10-15 NOTE — Telephone Encounter (Signed)
Pt phys therapy calling to see if there's any protocol for pt arm she was seen for. monique [phone]

## 2016-10-15 NOTE — Telephone Encounter (Signed)
Refill #20.

## 2016-10-19 NOTE — Telephone Encounter (Signed)
LMOM for Alyssa [name]/OT of the below message

## 2016-10-24 IMAGING — NM NM GASTRIC EMPTYING
8 series · 8 of 8 positions shown · non-contrast
Comparison: None.

CLINICAL DATA: Six-month history of early satiety, nausea vomiting
bloating and reflux as well as weight loss.

EXAM:
NUCLEAR MEDICINE GASTRIC EMPTYING SCAN
TECHNIQUE: After oral ingestion of radiolabeled meal, sequential abdominal
images were obtained for 4 hours. Percentage of activity emptying
the stomach was calculated at 1 hour, 2 hour, 3 hour, and 4 hours.
RADIOPHARMACEUTICALS:  2.1 mCi 5c-FFm MDP labeled sulfur colloid
orally into egg whites with toast and 8 ounces of water.

[Series 1: 0 min · 4.14mm/px · 1 of 1 slices shown (1 of 2)]
[im 1/1]
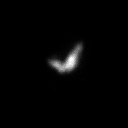

[Series 1: 0 min · 4.14mm/px · 1 of 1 slices shown (2 of 2)]
[im 1/1]
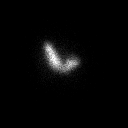

[Series 1: 2 hr · 4.14mm/px · 1 of 1 slices shown (1 of 2)]
[im 1/1]
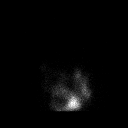

[Series 1: 2 hr · 4.14mm/px · 1 of 1 slices shown (2 of 2)]
[im 1/1]
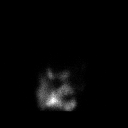

[Series 2: 1 hr · 4.14mm/px · 1 of 1 slices shown (1 of 2)]
[im 1/1]
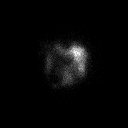

[Series 2: 90 min · 4.14mm/px · 1 of 1 slices shown (1 of 2)]
[im 1/1]
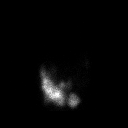

[Series 2: 1 hr · 4.14mm/px · 1 of 1 slices shown (2 of 2)]
[im 1/1]
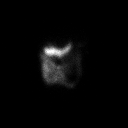

[Series 2: 90 min · 4.14mm/px · 1 of 1 slices shown (2 of 2)]
[im 1/1]
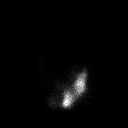

[8 of 8 positions shown; findings below may reference images not displayed]

FINDINGS: Expected location of the stomach in the left upper quadrant.
Ingested meal empties the stomach gradually over the course of the
study.

50.4% emptied at 1 hr ( normal >= 10%)

85% emptied at 2 hr ( normal >= 40%)

96% emptied at 3 hr ( normal >= 70%)
IMPRESSION: Normal gastric emptying study.

## 2016-10-29 ENCOUNTER — Ambulatory Visit (INDEPENDENT_AMBULATORY_CARE_PROVIDER_SITE_OTHER): Payer: Medicaid Other

## 2016-10-29 ENCOUNTER — Ambulatory Visit (INDEPENDENT_AMBULATORY_CARE_PROVIDER_SITE_OTHER): Payer: Medicare HMO | Admitting: Orthopaedic Surgery

## 2016-10-29 ENCOUNTER — Encounter (INDEPENDENT_AMBULATORY_CARE_PROVIDER_SITE_OTHER): Payer: Self-pay | Admitting: Orthopaedic Surgery

## 2016-10-29 DIAGNOSIS — S42291A Other displaced fracture of upper end of right humerus, initial encounter for closed fracture: Secondary | ICD-10-CM

## 2016-10-29 NOTE — Progress Notes (Signed)
Patient follows up today approximately 6 weeks status post nondisplaced proximal humerus fracture. She is doing much better. On physical exam she is able to elevate her arm to above her head without much difficulty. She does have some constant throbbing at night. Overall she is doing much better. X-ray show healed proximal humerus fracture with signs of rotator cuff arthropathy and high riding humeral head and acetabularization of the acromion.  Patient has reached [place] and this point. Recommend continued home exercise program. Follow up with me as needed.

## 2016-12-06 ENCOUNTER — Ambulatory Visit (INDEPENDENT_AMBULATORY_CARE_PROVIDER_SITE_OTHER): Payer: Medicare HMO | Admitting: Orthopaedic Surgery

## 2016-12-06 ENCOUNTER — Ambulatory Visit (INDEPENDENT_AMBULATORY_CARE_PROVIDER_SITE_OTHER): Payer: Medicare HMO

## 2016-12-06 ENCOUNTER — Encounter (INDEPENDENT_AMBULATORY_CARE_PROVIDER_SITE_OTHER): Payer: Self-pay | Admitting: Orthopaedic Surgery

## 2016-12-06 DIAGNOSIS — G8929 Other chronic pain: Secondary | ICD-10-CM | POA: Insufficient documentation

## 2016-12-06 DIAGNOSIS — M25511 Pain in right shoulder: Secondary | ICD-10-CM

## 2016-12-06 DIAGNOSIS — S42291A Other displaced fracture of upper end of right humerus, initial encounter for closed fracture: Secondary | ICD-10-CM

## 2016-12-06 MED ORDER — DICLOFENAC SODIUM 75 MG PO TBEC: 75.0000 mg | DELAYED_RELEASE_TABLET | Freq: Two times a day (BID) | ORAL | 2 refills | Status: DC

## 2016-12-06 NOTE — Progress Notes (Signed)
Office Visit Note   Patient: Alyssa [name]           Date of Birth: [birth year]           MRN: [id number] Visit Date: 12/06/2016              Requested by: Everardo Beals, NP [facility] [address] [place], [place] [address] PCP: [name], NP   Assessment & Plan: Visit Diagnoses:  1. Fracture of humeral head, closed, right, initial encounter   2. Chronic right shoulder pain     Plan: rotator cuff arthropathy.  Continue with OTC nsaids.  Will refer to Dr. Ernestina Patches for intraarticular right shoulder injection.  F/u prn.  Follow-Up Instructions: Return if symptoms worsen or fail to improve.   Orders:  Orders Placed This Encounter  Procedures  . XR Shoulder Right   Meds ordered this encounter  Medications  . diclofenac (VOLTAREN) 75 MG EC tablet    Sig: Take 1 tablet (75 mg total) by mouth 2 (two) times daily.    Dispense:  30 tablet    Refill:  2      Procedures: No procedures performed   Clinical Data: No additional findings.   Subjective: Chief Complaint  Patient presents with  . Right Shoulder - Follow-up, Fracture    Patient comes in today for different right shoulder pain, worse with elevation and use of arm and at night.  Pain doesn't radiate.      Review of Systems  Constitutional: Negative.   HENT: Negative.   Eyes: Negative.   Respiratory: Negative.   Cardiovascular: Negative.   Endocrine: Negative.   Musculoskeletal: Negative.   Neurological: Negative.   Hematological: Negative.   Psychiatric/Behavioral: Negative.   All other systems reviewed and are negative.    Objective: Vital Signs: There were no vitals taken for this visit.  Physical Exam  Constitutional: She is oriented to person, place, and time. She appears well-developed and well-nourished.  Pulmonary/Chest: Effort normal.  Neurological: She is alert and oriented to person, place, and time.  Skin: Skin is warm. Capillary refill takes  less than 2 seconds.  Psychiatric: She has a normal mood and affect. Her behavior is normal. Judgment and thought content normal.  Nursing note and vitals reviewed.   Ortho Exam Right shoulder - active forward flexion to 145 degrees with catching pain - motor and sensory grossly intact  Specialty Comments:  No specialty comments available.  Imaging: No results found.   PMFS History: Patient Active Problem List   Diagnosis Date Noted  . Chronic right shoulder pain 12/06/2016  . Fall 09/17/2016  . Fracture of humeral head, closed, right, initial encounter 09/17/2016  . Rib fractures 09/17/2016  . Multiple falls 09/17/2016  . Severe muscle deconditioning 09/17/2016  . Failure to thrive in adult 09/17/2016  . Melena 04/25/2016  . Acute kidney injury ([place]) 04/25/2016  . Essential hypertension 04/25/2016  . Anxiety state 04/25/2016  . Tobacco abuse 04/25/2016  . Hyperlipidemia 04/25/2016  . Syncope 04/24/2016  . Syncope and collapse 04/24/2016  . Gait difficulty 01/12/2016  . Foot drop, left 01/12/2016  . Severe recurrent major depression without psychotic features ([place]) 08/28/2015  . Spondylolisthesis at L4-L5 level 07/30/2015  . PVD (peripheral vascular disease) with claudication ([place]) 07/29/2014  . Weakness-Bilateral arm/leg 07/29/2014  . Numbness-left leg 07/29/2014  . Swelling of limb-Legs 07/29/2014  . Atherosclerosis of native arteries of the extremities with intermittent claudication 09/30/2011   Past Medical  History:  Diagnosis Date  . Anxiety   . Arthritis   . Headache(784.0)   . Hyperlipidemia   . Hypertension   . Joint pain   . Leg pain   . Peripheral neuropathy ([place])   . Peripheral vascular disease ([place])     Family History  Problem Relation Age of Onset  . Heart attack Mother   . Heart disease Mother   . Hyperlipidemia Mother   . Hypertension Mother   . Heart attack Father   . Heart disease Father     Before age 43  . Hyperlipidemia Father   .  Hypertension Father   . Cancer Sister   . Aneurysm Brother   . Cancer Brother     Past Surgical History:  Procedure Laterality Date  . ABDOMINAL ANGIOGRAM N/A 10/29/2011   Procedure: ABDOMINAL ANGIOGRAM;  Surgeon: Elam Dutch, MD;  Location: [facility] CATH LAB;  Service: Cardiovascular;  Laterality: N/A;  . BACK SURGERY    . FEMORAL-FEMORAL BYPASS GRAFT  08/31/2010  . JOINT REPLACEMENT Right 2012   Hip  . TOTAL HIP ARTHROPLASTY     right   Social History   Occupational History  . Retired    Social History Main Topics  . Smoking status: Current Every Day Smoker    Packs/day: 0.50    Types: Cigarettes    Last attempt to quit: 01/27/2014  . Smokeless tobacco: Never Used  . Alcohol use No  . Drug use: No  . Sexual activity: Not on file

## 2016-12-06 NOTE — Addendum Note (Signed)
Addended by: Precious Bard on: 12/06/2016 11:02 AM   Modules accepted: Orders

## 2016-12-14 ENCOUNTER — Ambulatory Visit (INDEPENDENT_AMBULATORY_CARE_PROVIDER_SITE_OTHER): Payer: Medicare HMO | Admitting: Physical Medicine and Rehabilitation

## 2016-12-23 ENCOUNTER — Ambulatory Visit (INDEPENDENT_AMBULATORY_CARE_PROVIDER_SITE_OTHER): Payer: Self-pay

## 2016-12-23 ENCOUNTER — Encounter (INDEPENDENT_AMBULATORY_CARE_PROVIDER_SITE_OTHER): Payer: Self-pay | Admitting: Physical Medicine and Rehabilitation

## 2016-12-23 ENCOUNTER — Ambulatory Visit (INDEPENDENT_AMBULATORY_CARE_PROVIDER_SITE_OTHER): Payer: Medicare HMO | Admitting: Physical Medicine and Rehabilitation

## 2016-12-23 VITALS — BP 179/106 | HR 97

## 2016-12-23 DIAGNOSIS — G8929 Other chronic pain: Secondary | ICD-10-CM

## 2016-12-23 DIAGNOSIS — M25511 Pain in right shoulder: Secondary | ICD-10-CM

## 2016-12-23 NOTE — Progress Notes (Signed)
Alyssa [name] - 76 y.o. female MRN [id number]  Date of birth: 02/19/1941  Office Visit Note: Visit Date: 12/23/2016 PCP: [name], NP Referred by: Everardo Beals, NP  Subjective: Chief Complaint  Patient presents with  . Right Shoulder - Pain   HPI: Alyssa [name] is a very pleasant 76 year old female with chronic worsening severe Right shoulder pain for 1 month. No known injury. Constant pain and worse with movement. Radiates down arm to hand. Denies numbness or tingling. She has failed conservative care and has x-ray imaging consistent with glenohumeral joint arthropathy.    ROS Otherwise per HPI.  Assessment & Plan: Visit Diagnoses:  1. Chronic right shoulder pain     Plan: No additional findings.  Planned right glenohumeral joint anesthetic arthrogram   Meds & Orders: No orders of the defined types were placed in this encounter.   Orders Placed This Encounter  Procedures  . Large Joint Injection/Arthrocentesis  . XR C-ARM NO REPORT    Follow-up: Return if symptoms worsen or fail to improve, for follow up with Dr. Erlinda Hong.   Procedures: Large Joint Inj Date/Time: 12/23/2016 10:02 AM Performed by: Magnus Sinning Authorized by: Magnus Sinning   Consent Given by:  Patient Site marked: the procedure site was marked   Timeout: prior to procedure the correct patient, procedure, and site was verified   Indications:  Pain and diagnostic evaluation Location:  Shoulder Prep: patient was prepped and draped in usual sterile fashion   Needle Size:  22 G Needle Length:  3.5 inches Approach:  Anteromedial Ultrasound Guidance: No   Fluoroscopic Guidance: No   Arthrogram: Yes   Medications:  80 mg triamcinolone acetonide 40 MG/ML; 3 mL bupivacaine 0.5 % Aspiration Attempted: No   Patient tolerance:  Patient tolerated the procedure well with no immediate complications  Arthrogram demonstrated excellent flow of contrast throughout the joint surface  without extravasation or obvious defect.  The patient had relief of symptoms during the anesthetic phase of the injection.     No notes on file   Clinical History: No specialty comments available.  She reports that she has been smoking Cigarettes.  She has been smoking about 0.50 packs per day. She has never used smokeless tobacco.   Recent Labs  09/19/16 0242  HGBA1C 5.9*    Objective:  VS:  HT:    WT:   BMI:     BP:(!) 179/106  HR:97bpm  TEMP: ( )  RESP:  Physical Exam  Musculoskeletal:  Decreased range of motion and painful range of motion of the right shoulder. Good distal strength.    Ortho Exam Imaging: No results found.  Past Medical/Family/Surgical/Social History: Medications & Allergies reviewed per EMR Patient Active Problem List   Diagnosis Date Noted  . Chronic right shoulder pain 12/06/2016  . Fall 09/17/2016  . Fracture of humeral head, closed, right, initial encounter 09/17/2016  . Rib fractures 09/17/2016  . Multiple falls 09/17/2016  . Severe muscle deconditioning 09/17/2016  . Failure to thrive in adult 09/17/2016  . Melena 04/25/2016  . Acute kidney injury ([place]) 04/25/2016  . Essential hypertension 04/25/2016  . Anxiety state 04/25/2016  . Tobacco abuse 04/25/2016  . Hyperlipidemia 04/25/2016  . Syncope 04/24/2016  . Syncope and collapse 04/24/2016  . Gait difficulty 01/12/2016  . Foot drop, left 01/12/2016  . Severe recurrent major depression without psychotic features ([place]) 08/28/2015  . Spondylolisthesis at L4-L5 level 07/30/2015  . PVD (peripheral vascular disease) with claudication ([place]) 07/29/2014  .  Weakness-Bilateral arm/leg 07/29/2014  . Numbness-left leg 07/29/2014  . Swelling of limb-Legs 07/29/2014  . Atherosclerosis of native arteries of the extremities with intermittent claudication 09/30/2011   Past Medical History:  Diagnosis Date  . Anxiety   . Arthritis   . Headache(784.0)   . Hyperlipidemia   . Hypertension   .  Joint pain   . Leg pain   . Peripheral neuropathy ([place])   . Peripheral vascular disease ([place])    Family History  Problem Relation Age of Onset  . Heart attack Mother   . Heart disease Mother   . Hyperlipidemia Mother   . Hypertension Mother   . Heart attack Father   . Heart disease Father     Before age 13  . Hyperlipidemia Father   . Hypertension Father   . Cancer Sister   . Aneurysm Brother   . Cancer Brother    Past Surgical History:  Procedure Laterality Date  . ABDOMINAL ANGIOGRAM N/A 10/29/2011   Procedure: ABDOMINAL ANGIOGRAM;  Surgeon: Elam Dutch, MD;  Location: [facility] CATH LAB;  Service: Cardiovascular;  Laterality: N/A;  . BACK SURGERY    . FEMORAL-FEMORAL BYPASS GRAFT  08/31/2010  . JOINT REPLACEMENT Right 2012   Hip  . TOTAL HIP ARTHROPLASTY     right   Social History   Occupational History  . Retired    Social History Main Topics  . Smoking status: Current Every Day Smoker    Packs/day: 0.50    Types: Cigarettes    Last attempt to quit: 01/27/2014  . Smokeless tobacco: Never Used  . Alcohol use No  . Drug use: No  . Sexual activity: Not on file

## 2016-12-23 NOTE — Patient Instructions (Signed)

## 2016-12-27 ENCOUNTER — Observation Stay (HOSPITAL_COMMUNITY)
Admission: EM | Admit: 2016-12-27 | Discharge: 2016-12-29 | Disposition: A | Discharge status: Home / Self Care | Payer: Medicare HMO | Attending: Internal Medicine | Admitting: Internal Medicine

## 2016-12-27 ENCOUNTER — Emergency Department (HOSPITAL_COMMUNITY): Payer: Medicare HMO

## 2016-12-27 ENCOUNTER — Encounter (HOSPITAL_COMMUNITY): Payer: Self-pay | Admitting: *Deleted

## 2016-12-27 DIAGNOSIS — G629 Polyneuropathy, unspecified: Secondary | ICD-10-CM | POA: Insufficient documentation

## 2016-12-27 DIAGNOSIS — E785 Hyperlipidemia, unspecified: Secondary | ICD-10-CM | POA: Diagnosis not present

## 2016-12-27 DIAGNOSIS — I739 Peripheral vascular disease, unspecified: Secondary | ICD-10-CM | POA: Insufficient documentation

## 2016-12-27 DIAGNOSIS — I160 Hypertensive urgency: Secondary | ICD-10-CM | POA: Diagnosis not present

## 2016-12-27 DIAGNOSIS — D631 Anemia in chronic kidney disease: Secondary | ICD-10-CM | POA: Diagnosis not present

## 2016-12-27 DIAGNOSIS — I129 Hypertensive chronic kidney disease with stage 1 through stage 4 chronic kidney disease, or unspecified chronic kidney disease: Secondary | ICD-10-CM | POA: Diagnosis not present

## 2016-12-27 DIAGNOSIS — G894 Chronic pain syndrome: Secondary | ICD-10-CM | POA: Diagnosis present

## 2016-12-27 DIAGNOSIS — F17210 Nicotine dependence, cigarettes, uncomplicated: Secondary | ICD-10-CM | POA: Diagnosis not present

## 2016-12-27 DIAGNOSIS — Z8249 Family history of ischemic heart disease and other diseases of the circulatory system: Secondary | ICD-10-CM | POA: Diagnosis not present

## 2016-12-27 DIAGNOSIS — R251 Tremor, unspecified: Secondary | ICD-10-CM | POA: Diagnosis not present

## 2016-12-27 DIAGNOSIS — F13930 Sedative, hypnotic or anxiolytic use, unspecified with withdrawal, uncomplicated: Secondary | ICD-10-CM | POA: Diagnosis present

## 2016-12-27 DIAGNOSIS — Z7952 Long term (current) use of systemic steroids: Secondary | ICD-10-CM | POA: Insufficient documentation

## 2016-12-27 DIAGNOSIS — D649 Anemia, unspecified: Secondary | ICD-10-CM | POA: Diagnosis present

## 2016-12-27 DIAGNOSIS — R7989 Other specified abnormal findings of blood chemistry: Secondary | ICD-10-CM | POA: Diagnosis not present

## 2016-12-27 DIAGNOSIS — I10 Essential (primary) hypertension: Secondary | ICD-10-CM | POA: Diagnosis present

## 2016-12-27 DIAGNOSIS — F339 Major depressive disorder, recurrent, unspecified: Secondary | ICD-10-CM | POA: Insufficient documentation

## 2016-12-27 DIAGNOSIS — F13239 Sedative, hypnotic or anxiolytic dependence with withdrawal, unspecified: Principal | ICD-10-CM | POA: Insufficient documentation

## 2016-12-27 DIAGNOSIS — M1990 Unspecified osteoarthritis, unspecified site: Secondary | ICD-10-CM | POA: Insufficient documentation

## 2016-12-27 DIAGNOSIS — N183 Chronic kidney disease, stage 3 (moderate): Secondary | ICD-10-CM | POA: Insufficient documentation

## 2016-12-27 DIAGNOSIS — G4700 Insomnia, unspecified: Secondary | ICD-10-CM | POA: Insufficient documentation

## 2016-12-27 DIAGNOSIS — F419 Anxiety disorder, unspecified: Secondary | ICD-10-CM | POA: Insufficient documentation

## 2016-12-27 DIAGNOSIS — F13230 Sedative, hypnotic or anxiolytic dependence with withdrawal, uncomplicated: Secondary | ICD-10-CM | POA: Diagnosis present

## 2016-12-27 DIAGNOSIS — G43909 Migraine, unspecified, not intractable, without status migrainosus: Secondary | ICD-10-CM | POA: Diagnosis not present

## 2016-12-27 DIAGNOSIS — F332 Major depressive disorder, recurrent severe without psychotic features: Secondary | ICD-10-CM | POA: Diagnosis not present

## 2016-12-27 DIAGNOSIS — N184 Chronic kidney disease, stage 4 (severe): Secondary | ICD-10-CM | POA: Diagnosis present

## 2016-12-27 DIAGNOSIS — N189 Chronic kidney disease, unspecified: Secondary | ICD-10-CM

## 2016-12-27 DIAGNOSIS — I959 Hypotension, unspecified: Secondary | ICD-10-CM | POA: Diagnosis present

## 2016-12-27 DIAGNOSIS — N179 Acute kidney failure, unspecified: Secondary | ICD-10-CM

## 2016-12-27 DIAGNOSIS — F13939 Sedative, hypnotic or anxiolytic use, unspecified with withdrawal, unspecified: Secondary | ICD-10-CM | POA: Diagnosis present

## 2016-12-27 LAB — CBC
HCT: 30 % — ABNORMAL LOW (ref 36.0–46.0)
Hemoglobin: 9.8 g/dL — ABNORMAL LOW (ref 12.0–15.0)
MCH: 26.9 pg (ref 26.0–34.0)
MCHC: 32.7 g/dL (ref 30.0–36.0)
MCV: 82.4 fL (ref 78.0–100.0)
Platelets: 567 10*3/uL — ABNORMAL HIGH (ref 150–400)
RBC: 3.64 MIL/uL — ABNORMAL LOW (ref 3.87–5.11)
RDW: 18.4 % — ABNORMAL HIGH (ref 11.5–15.5)
WBC: 10.5 10*3/uL (ref 4.0–10.5)

## 2016-12-27 LAB — BASIC METABOLIC PANEL
Anion gap: 10 (ref 5–15)
BUN: 32 mg/dL — ABNORMAL HIGH (ref 6–20)
CO2: 18 mmol/L — ABNORMAL LOW (ref 22–32)
Calcium: 9.3 mg/dL (ref 8.9–10.3)
Chloride: 114 mmol/L — ABNORMAL HIGH (ref 101–111)
Creatinine, Ser: 1.68 mg/dL — ABNORMAL HIGH (ref 0.44–1.00)
GFR calc Af Amer: 34 mL/min — ABNORMAL LOW (ref >60)
GFR calc non Af Amer: 29 mL/min — ABNORMAL LOW (ref >60)
Glucose, Bld: 113 mg/dL — ABNORMAL HIGH (ref 65–99)
Potassium: 4.2 mmol/L (ref 3.5–5.1)
Sodium: 142 mmol/L (ref 135–145)

## 2016-12-27 LAB — TROPONIN I: Troponin I: <0.03 ng/mL (ref <0.03)

## 2016-12-27 MED ORDER — LABETALOL HCL 5 MG/ML IV SOLN
10.0000 mg | Freq: Once | INTRAVENOUS | Status: AC
  Administered 2016-12-27: 10 mg via INTRAVENOUS
  Filled 2016-12-27: qty 4

## 2016-12-27 MED ORDER — BUPIVACAINE HCL 0.5 % IJ SOLN
3.0000 mL | INTRAMUSCULAR | Status: AC | PRN
  Administered 2016-12-23: 3 mL via INTRA_ARTICULAR

## 2016-12-27 MED ORDER — SODIUM CHLORIDE 0.9 % IV SOLN
INTRAVENOUS | Status: DC
  Administered 2016-12-27: 23:00:00 via INTRAVENOUS

## 2016-12-27 MED ORDER — TRIAMCINOLONE ACETONIDE 40 MG/ML IJ SUSP
80.0000 mg | INTRAMUSCULAR | Status: AC | PRN
  Administered 2016-12-23: 80 mg via INTRA_ARTICULAR

## 2016-12-27 MED ORDER — LORAZEPAM 2 MG/ML IJ SOLN
1.0000 mg | Freq: Once | INTRAMUSCULAR | Status: AC
  Administered 2016-12-27: 1 mg via INTRAVENOUS
  Filled 2016-12-27: qty 1

## 2016-12-27 MED ORDER — DIPHENHYDRAMINE HCL 50 MG/ML IJ SOLN
12.5000 mg | Freq: Once | INTRAMUSCULAR | Status: AC
  Administered 2016-12-27: 12.5 mg via INTRAVENOUS
  Filled 2016-12-27: qty 1

## 2016-12-27 MED ORDER — METOCLOPRAMIDE HCL 5 MG/ML IJ SOLN
5.0000 mg | Freq: Once | INTRAMUSCULAR | Status: AC
  Administered 2016-12-27: 5 mg via INTRAVENOUS
  Filled 2016-12-27: qty 2

## 2016-12-27 NOTE — ED Notes (Signed)
EDP at bedside  

## 2016-12-27 NOTE — ED Notes (Signed)
Pt returned from CT °

## 2016-12-27 NOTE — ED Triage Notes (Signed)
The pt is c/o having anxiety  For one week she has been out of her anxiety meds for  1 1/2 weeks.  She has not been able to sleep for 4 days also

## 2016-12-27 NOTE — ED Provider Notes (Signed)
[facility] DEPT Provider Note   CSN: [id number] Arrival date & time: 12/27/16  6659     History   Chief Complaint Chief Complaint  Patient presents with  . Anxiety    HPI Alyssa [name] is a 76 y.o. female.  HPI  76 yo F with PMHx of HTN, HLD, PVD here with anxiety. Pt previously on klonopin 2 mg nightly and trazodone, which she ran out of "around 2 weeks ago." She has since not been able to sleep and has had tremors, anxiety, and a "fuzzy feeling" in her head along with daily, tension-type HA. She endorses difficulty sleeping for more than 2-3 hours. No hallucinations. No SI, HI, or AVH. No CP or SOB. She called her pharmacy who reportedly is out of this medication and had to order it on back order.  Past Medical History:  Diagnosis Date  . Anxiety   . Arthritis   . Headache(784.0)   . Hyperlipidemia   . Hypertension   . Joint pain   . Leg pain   . Peripheral neuropathy ([place])   . Peripheral vascular disease [facility])     Patient Active Problem List   Diagnosis Date Noted  . Hypertensive urgency 12/28/2016  . Elevated serum creatinine 12/28/2016  . Normocytic anemia 12/28/2016  . Chronic pain 12/28/2016  . 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 Chronic right shoulder pain 12/06/2016  . Fall 09/17/2016  . Fracture of humeral head, closed, right, initial encounter 09/17/2016  . Rib fractures 09/17/2016  . Multiple falls 09/17/2016  . Severe muscle deconditioning 09/17/2016  . Failure to thrive in adult 09/17/2016  . Melena 04/25/2016  . Acute kidney injury ([place]) 04/25/2016  . Essential hypertension 04/25/2016  . Anxiety state 04/25/2016  . Tobacco abuse 04/25/2016  . Hyperlipidemia 04/25/2016  . Syncope 04/24/2016  . Syncope and collapse 04/24/2016  . Gait difficulty 01/12/2016  . Foot drop, left 01/12/2016  . 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 Spondylolisthesis at L4-L5 level 07/30/2015  . PVD  (peripheral vascular disease) with claudication ([place]) 07/29/2014  . Weakness-Bilateral arm/leg 07/29/2014  . Numbness-left leg 07/29/2014  . Swelling of limb-Legs 07/29/2014  . Atherosclerosis of native arteries of the extremities with intermittent claudication 09/30/2011    Past Surgical History:  Procedure Laterality Date  . ABDOMINAL ANGIOGRAM N/A 10/29/2011   Procedure: ABDOMINAL ANGIOGRAM;  Surgeon: Elam Dutch, MD;  Location: [facility] CATH LAB;  Service: Cardiovascular;  Laterality: N/A;  . BACK SURGERY    . FEMORAL-FEMORAL BYPASS GRAFT  08/31/2010  . JOINT REPLACEMENT Right 2012   Hip  . TOTAL HIP ARTHROPLASTY     right    OB History    No data available       Home Medications    Prior to Admission medications   Medication Sig Start Date End Date Taking? Authorizing Provider  acetaminophen (TYLENOL) 325 MG tablet Take 2 tablets (650 mg total) by mouth every 6 (six) hours as needed for mild pain (or Fever >/= 101). 04/26/16  Yes Verlee Monte, MD  citalopram (CELEXA) 40 MG tablet Take 1 tablet (40 mg total) by mouth daily. Patient taking differently: Take 40 mg by mouth daily after lunch.  08/30/15  Yes Delfin Gant, NP  clonazePAM (KLONOPIN) 2 MG tablet Take 1 tablet (2 mg total) by mouth at bedtime. 09/20/16  Yes Florencia Reasons, MD  diphenhydrAMINE (BENADRYL) 25 mg capsule Take 1 capsule (25 mg total) by mouth every 6 (six) hours  as needed for itching. 12/28/15  Yes Varney Biles, MD  feeding supplement, ENSURE ENLIVE, (ENSURE ENLIVE) LIQD Take 237 mLs by mouth 2 (two) times daily between meals. 09/20/16  Yes Florencia Reasons, MD  ferrous sulfate 325 (65 FE) MG tablet Take 1 tablet (325 mg total) by mouth every Monday, Wednesday, and Friday. With breakfast 09/20/16  Yes Florencia Reasons, MD  gabapentin (NEURONTIN) 100 MG capsule Take 1 capsule (100 mg total) by mouth 3 (three) times daily. 08/31/16  Yes Marcial Pacas, MD  Multiple Vitamin (MULTIVITAMIN WITH MINERALS) TABS tablet Take 1 tablet by mouth  daily. One a Day Women's   Yes Historical Provider, MD  polyethylene glycol (MIRALAX / GLYCOLAX) packet Take 17 g by mouth daily. Patient taking differently: Take 17 g by mouth daily as needed for mild constipation. Mix in 8 oz liquid and drink 08/30/15  Yes Delfin Gant, NP  predniSONE (DELTASONE) 10 MG tablet Take 2 tablets (20 mg total) by mouth daily. 07/25/16  Yes Sherlene Shams, MD  rosuvastatin (CRESTOR) 10 MG tablet Take 10 mg by mouth daily.    Yes Historical Provider, MD  traZODone (DESYREL) 50 MG tablet Take 1 tablet (50 mg total) by mouth at bedtime as needed for sleep. 08/30/15  Yes Delfin Gant, NP  valsartan (DIOVAN) 320 MG tablet Take 320 mg by mouth daily. 08/29/16  Yes Historical Provider, MD    Family History Family History  Problem Relation Age of Onset  . Heart attack Mother   . Heart disease Mother   . Hyperlipidemia Mother   . Hypertension Mother   . Heart attack Father   . Heart disease Father     Before age 72  . Hyperlipidemia Father   . Hypertension Father   . Cancer Sister   . Aneurysm Brother   . Cancer Brother     Social History Social History  Substance Use Topics  . Smoking status: Current Every Day Smoker    Packs/day: 0.50    Types: Cigarettes    Last attempt to quit: 01/27/2014  . Smokeless tobacco: Never Used  . Alcohol use No     Allergies   Patient has no known allergies.   Review of Systems Review of Systems  Constitutional: Positive for fatigue. Negative for chills and fever.  HENT: Negative for congestion and rhinorrhea.   Eyes: Negative for visual disturbance.  Respiratory: Negative for cough, shortness of breath and wheezing.   Cardiovascular: Negative for chest pain and leg swelling.  Gastrointestinal: Positive for nausea. Negative for abdominal pain, diarrhea and vomiting.  Genitourinary: Negative for dysuria and flank pain.  Musculoskeletal: Negative for neck pain and neck stiffness.  Skin: Negative for rash  and wound.  Allergic/Immunologic: Negative for immunocompromised state.  Neurological: Positive for tremors, light-headedness and headaches. Negative for syncope and weakness.  All other systems reviewed and are negative.    Physical Exam Updated Vital Signs BP (!) 190/98 (BP Location: Right Arm)   Pulse 80   Temp 98.4 F (36.9 C) (Oral)   Resp 20   Ht 5\' 3"  (1.6 m)   Wt 99 lb 6.4 oz (45.1 kg)   SpO2 100%   BMI 17.61 kg/m   Physical Exam  Constitutional: She is oriented to person, place, and time. She appears well-developed and well-nourished. No distress.  HENT:  Head: Normocephalic and atraumatic.  Eyes: Conjunctivae are normal.  Neck: Neck supple.  Cardiovascular: Normal rate, regular rhythm and normal heart sounds.  Exam reveals  no friction rub.   No murmur heard. Pulmonary/Chest: Effort normal and breath sounds normal. No respiratory distress. She has no wheezes. She has no rales.  Abdominal: She exhibits no distension.  Musculoskeletal: She exhibits no edema.  Neurological: She is alert and oriented to person, place, and time. She has normal strength. No cranial nerve deficit or sensory deficit. She exhibits normal muscle tone.  Tremulous, with course tremor that does not improve with intention. Otherwise no CN deficits. Normal strength b/l UE and LE.  Skin: Skin is warm. Capillary refill takes less than 2 seconds.  Psychiatric: She has a normal mood and affect.  Nursing note and vitals reviewed.    ED Treatments / Results  Labs (all labs ordered are listed, but only abnormal results are displayed) Labs Reviewed  CBC - Abnormal; Notable for the following:       Result Value   RBC 3.64 (*)    Hemoglobin 9.8 (*)    HCT 30.0 (*)    RDW 18.4 (*)    Platelets 567 (*)    All other components within normal limits  BASIC METABOLIC PANEL - Abnormal; Notable for the following:    Chloride 114 (*)    CO2 18 (*)    Glucose, Bld 113 (*)    BUN 32 (*)    Creatinine,  Ser 1.68 (*)    GFR calc non Af Amer 29 (*)    GFR calc Af Amer 34 (*)    All other components within normal limits  TROPONIN I  TROPONIN I  BASIC METABOLIC PANEL  TROPONIN I    EKG  EKG Interpretation  Date/Time:  Monday December 27 2016 18:32:31 EDT Ventricular Rate:  82 PR Interval:  148 QRS Duration: 70 QT Interval:  350 QTC Calculation: 408 R Axis:   75 Text Interpretation:  Normal sinus rhythm Left ventricular hypertrophy with repolarization abnormality Abnormal ECG No significant change since last tracing Confirmed by Maaliyah Adolph MD, Lysbeth Galas [phone]) on 12/27/2016 7:06:59 PM       Radiology Ct Head Wo Contrast  Result Date: 12/27/2016 CLINICAL DATA:  Headaches and difficulty sleeping. EXAM: CT HEAD WITHOUT CONTRAST TECHNIQUE: Contiguous axial images were obtained from the base of the skull through the vertex without intravenous contrast. COMPARISON:  09/17/2016 CT of the head FINDINGS: Brain: The brainstem, cerebellum, cerebral peduncles, thalami, basal ganglia, basilar cisterns, and ventricular system appear within normal limits. Periventricular white matter and corona radiata hypodensities favor chronic ischemic microvascular white matter disease. No intracranial hemorrhage, mass lesion, or acute CVA. Vascular: There is atherosclerotic calcification of the cavernous carotid arteries bilaterally. Skull: Unremarkable Sinuses/Orbits: Partial ethmoidectomies. 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 IMPRESSION: 1. No acute intracranial findings. 2. Periventricular white matter and corona radiata hypodensities favor chronic ischemic microvascular white matter disease. Electronically Signed   By: Van Clines M.D.   On: 12/27/2016 19:47    Procedures Procedures (including critical care time)  Medications Ordered in ED Medications  feeding supplement (ENSURE ENLIVE) (ENSURE ENLIVE) liquid 237 mL (not administered)  ferrous sulfate tablet 325 mg (not administered)    predniSONE (DELTASONE) tablet 20 mg (not administered)  gabapentin (NEURONTIN) capsule 100 mg (100 mg Oral Given 12/28/16 0056)  diphenhydrAMINE (BENADRYL) capsule 25 mg (not administered)  citalopram (CELEXA) tablet 40 mg (not administered)  traZODone (DESYREL) tablet 50 mg (50 mg Oral Given 12/28/16 0056)  rosuvastatin (CRESTOR) tablet 10 mg (not administered)  amLODipine (NORVASC) tablet 10 mg (10 mg Oral Given 12/28/16  0056)  enoxaparin (LOVENOX) injection 30 mg (not administered)  sodium chloride flush (NS) 0.9 % injection 3 mL (not administered)  0.9 %  sodium chloride infusion ( Intravenous Restarted 12/28/16 0041)  acetaminophen (TYLENOL) tablet 650 mg (not administered)    Or  acetaminophen (TYLENOL) suppository 650 mg (not administered)  traMADol (ULTRAM) tablet 50 mg (50 mg Oral Given 12/28/16 0056)  ondansetron (ZOFRAN) tablet 4 mg (not administered)    Or  ondansetron (ZOFRAN) injection 4 mg (not administered)  labetalol (NORMODYNE,TRANDATE) injection 5 mg (not administered)  clonazePAM (KLONOPIN) tablet 1 mg (not administered)  LORazepam (ATIVAN) injection 1 mg (1 mg Intravenous Given 12/27/16 1956)  metoCLOPramide (REGLAN) injection 5 mg (5 mg Intravenous Given 12/27/16 2028)  diphenhydrAMINE (BENADRYL) injection 12.5 mg (12.5 mg Intravenous Given 12/27/16 2028)  LORazepam (ATIVAN) injection 1 mg (1 mg Intravenous Given 12/27/16 2157)  labetalol (NORMODYNE,TRANDATE) injection 10 mg (10 mg Intravenous Given 12/27/16 2319)     Initial Impression / Assessment and Plan / ED Course  I have reviewed the triage vital signs and the nursing notes.  Pertinent labs & imaging results that were available during my care of the patient were reviewed by me and considered in my medical decision making (see chart for details).     76 year old female with extensive past medical history here for worsening anxiety after being out of her Klonopin for a week and a half. On arrival, she is markedly  hypertensive, tremulous, and anxious. I'm concerned she is in active benzodiazepine withdrawal. She does have a headache as well, raising concern for hypertensive urgency secondary to this withdrawal. Will obtain CT head, give IV Ativan, and reassess.  CT head is negative. Patient is persistently hypertensive after multiple doses of IV Ativan. She does appear less tremulous and has a CiWA score of 6. However, labs do show acute on chronic kidney injury with decreased bicarbonate, and I'm concern for developing an organ dysfunction secondary to her hypertension. Will admit her for complicated benzodiazepine withdrawal and monitoring of her renal function.  Final Clinical Impressions(s) / ED Diagnoses   Final diagnoses:  Hypertensive urgency  Benzodiazepine withdrawal with complication (HCC)  Acute renal failure superimposed on chronic kidney disease, unspecified CKD stage, unspecified acute renal failure type [facility])    New Prescriptions Current Discharge Medication List       Duffy Bruce, MD 12/28/16 [phone]

## 2016-12-27 NOTE — ED Notes (Signed)
Pt ambulated to restroom with cane, steady gait noted.

## 2016-12-27 NOTE — H&P (Signed)
History and Physical  [name]     IPJ:[id number]    DOB: 06/10/1941    DOA: 12/27/2016 PCP: [name], NP   Patient coming from: Home  Chief Complaint: Headache, tremor, insomnia, anxiety  HPI: Alyssa [name] is a 76 y.o. female with a past medical history significant for migraines, anxiety/panic d/o, HTN, chronic arthritis pain who presents with 1 week headaches, anxiety, tremor, and insomnia after abrupt cessation of Klonopin.  The patient was in her usual state of health until about 1 week ago when she ran out of Klonopin (2 mg twice daily).  She claims her drugstore was calling the wrong doctor's office for refills and she didn't know, and the problem wasn't detected until she ran out one week ago.  Since running out, she has been having increased anxiety, inability to sleep, tremor, headaches, and malaise.  Today, this wasn't getting better even with naproxen so she came to the ER.  She has had no chest pain, dyspnea, LOC, seizures, focal weakness or numbness.  ED course: -Afebrile, heart rate 95, respirations and pulse ox normal, BP initially 249/124 -Na 142, K 4.2, Cr 1.68 (baseline 1.2), WBC 10.5K, Hgb 9.8 and normocytic -Troponin negative -CT head showed only microvascular changes -ECG showed LVH, no change from previous, normal ST segments -She was given Reglan and Benadryl for migraine, labetalol for hypertension and started on Ativan for suspected benzodiazepine withdrawal and TRH were asked to evaluate for admission      ROS: Review of Systems  Constitutional: Positive for malaise/fatigue. Negative for chills and fever.  Respiratory: Negative for sputum production and shortness of breath.   Cardiovascular: Negative for chest pain, palpitations and leg swelling.  Neurological: Positive for tremors and headaches. Negative for dizziness, tingling, sensory change, speech change, focal weakness, seizures and loss of consciousness.    Psychiatric/Behavioral: The patient is nervous/anxious and has insomnia.   All other systems reviewed and are negative.         Past Medical History:  Diagnosis Date  . Anxiety   . Arthritis   . Headache(784.0)   . Hyperlipidemia   . Hypertension   . Joint pain   . Leg pain   . Peripheral neuropathy ([place])   . Peripheral vascular disease [facility])     Past Surgical History:  Procedure Laterality Date  . ABDOMINAL ANGIOGRAM N/A 10/29/2011   Procedure: ABDOMINAL ANGIOGRAM;  Surgeon: Elam Dutch, MD;  Location: [facility] CATH LAB;  Service: Cardiovascular;  Laterality: N/A;  . BACK SURGERY    . FEMORAL-FEMORAL BYPASS GRAFT  08/31/2010  . JOINT REPLACEMENT Right 2012   Hip  . TOTAL HIP ARTHROPLASTY     right    Social History: Patient lives with her boyfriend.  The patient walks with a cane, sometimes with a walker.  She smokes.    No Known Allergies  Family history: family history includes Aneurysm in her brother; Cancer in her brother and sister; Heart attack in her father and mother; Heart disease in her father and mother; Hyperlipidemia in her father and mother; Hypertension in her father and mother.  Prior to Admission medications   Medication Sig Start Date End Date Taking? Authorizing Provider  acetaminophen (TYLENOL) 325 MG tablet Take 2 tablets (650 mg total) by mouth every 6 (six) hours as needed for mild pain (or Fever >/= 101). 04/26/16  Yes Verlee Monte, MD  citalopram (CELEXA) 40 MG tablet Take 1 tablet (40 mg total) by mouth daily.  Patient taking differently: Take 40 mg by mouth daily after lunch.  08/30/15  Yes Delfin Gant, NP  clonazePAM (KLONOPIN) 2 MG tablet Take 1 tablet (2 mg total) by mouth at bedtime. 09/20/16  Yes Florencia Reasons, MD  diphenhydrAMINE (BENADRYL) 25 mg capsule Take 1 capsule (25 mg total) by mouth every 6 (six) hours as needed for itching. 12/28/15  Yes Varney Biles, MD  feeding supplement, ENSURE ENLIVE, (ENSURE ENLIVE) LIQD Take 237 mLs by  mouth 2 (two) times daily between meals. 09/20/16  Yes Florencia Reasons, MD  ferrous sulfate 325 (65 FE) MG tablet Take 1 tablet (325 mg total) by mouth every Monday, Wednesday, and Friday. With breakfast 09/20/16  Yes Florencia Reasons, MD  gabapentin (NEURONTIN) 100 MG capsule Take 1 capsule (100 mg total) by mouth 3 (three) times daily. 08/31/16  Yes Marcial Pacas, MD  Multiple Vitamin (MULTIVITAMIN WITH MINERALS) TABS tablet Take 1 tablet by mouth daily. One a Day Women's   Yes Historical Provider, MD  polyethylene glycol (MIRALAX / GLYCOLAX) packet Take 17 g by mouth daily. Patient taking differently: Take 17 g by mouth daily as needed for mild constipation. Mix in 8 oz liquid and drink 08/30/15  Yes Delfin Gant, NP  predniSONE (DELTASONE) 10 MG tablet Take 2 tablets (20 mg total) by mouth daily. 07/25/16  Yes Sherlene Shams, MD  rosuvastatin (CRESTOR) 10 MG tablet Take 10 mg by mouth daily.    Yes Historical Provider, MD  traZODone (DESYREL) 50 MG tablet Take 1 tablet (50 mg total) by mouth at bedtime as needed for sleep. 08/30/15  Yes Delfin Gant, NP  valsartan (DIOVAN) 320 MG tablet Take 320 mg by mouth daily. 08/29/16  Yes Historical Provider, MD       Physical Exam: BP (!) 211/116   Pulse 93   Temp 98.4 F (36.9 C) (Oral)   Resp (!) 27   Ht 5\' 3"  (1.6 m)   Wt 45.1 kg (99 lb 7 oz)   SpO2 100%   BMI 17.61 kg/m  General appearance: Elderly adult female, alert and in no acute distress, appears tired.   Eyes: Anicteric, conjunctiva pink, lids and lashes normal. PERRL.    ENT: No nasal deformity, discharge, epistaxis.  Hearing normal. OP moist without lesions.   Neck: No neck masses.  Trachea midline.  No thyromegaly/tenderness. Lymph: No cervical or supraclavicular lymphadenopathy. Skin: Warm and dry.  No suspicious rashes or lesions. Cardiac: RRR, nl S1-S2, no murmurs appreciated.  Capillary refill is brisk.  JVP normal.  No LE edema.  Radial and DP pulses 2+ and symmetric. Respiratory:  Normal respiratory rate and rhythm.  CTAB without rales or wheezes. Abdomen: Abdomen soft.  No TTP. No ascites, distension, hepatosplenomegaly.   MSK: No deformities or effusions.  No cyanosis or clubbing. Neuro: Pupils are 3 mm and reactive to 2 mm.  Extraocular movements are intact, without nystagmus.  Cranial nerve 5 is within normal limits.  Cranial nerve 7 is symmetrical.  Cranial nerve 8 is within normal limits.  Cranial nerves 9 and 10 reveal equal palate elevation.  Cranial nerve 11 reveals sternocleidomastoid strong.  Cranial nerve 12 is midline. Motor strength testing is 5/5 in the upper and lower extremities bilaterally with normal motor, tone and bulk. Sensory examination is intact to light touch. The patient is oriented to time, place and person.  Speech is fluent.  Naming is grossly intact.  Recall, recent and remote, as well as general fund of knowledge  seem mildly diminished, consistent with recent Ativan.  Attention span and concentration are mildly diminished, consistent with recent Ativan.  Psych: Sensorium intact and responding to questions.  Behavior appropriate.  Affect normal.     Labs on Admission:  I have personally reviewed following labs and imaging studies: CBC:  Recent Labs Lab 12/27/16 1918  WBC 10.5  HGB 9.8*  HCT 30.0*  MCV 82.4  PLT 742*   Basic Metabolic Panel:  Recent Labs Lab 12/27/16 1918  NA 142  K 4.2  CL 114*  CO2 18*  GLUCOSE 113*  BUN 32*  CREATININE 1.68*  CALCIUM 9.3   GFR: Estimated Creatinine Clearance: 21.2 mL/min (by C-G formula based on SCr of 1.68 mg/dL (H)).  Liver Function Tests: No results for input(s): AST, ALT, ALKPHOS, BILITOT, PROT, ALBUMIN in the last 168 hours. No results for input(s): LIPASE, AMYLASE in the last 168 hours. No results for input(s): AMMONIA in the last 168 hours. Coagulation Profile: No results for input(s): INR, PROTIME in the last 168 hours. Cardiac Enzymes:  Recent Labs Lab 12/27/16 1918    TROPONINI <0.03   BNP (last 3 results) No results for input(s): PROBNP in the last 8760 hours. HbA1C: No results for input(s): HGBA1C in the last 72 hours. CBG: No results for input(s): GLUCAP in the last 168 hours. Lipid Profile: No results for input(s): CHOL, HDL, LDLCALC, TRIG, CHOLHDL, LDLDIRECT in the last 72 hours. Thyroid Function Tests: No results for input(s): TSH, T4TOTAL, FREET4, T3FREE, THYROIDAB in the last 72 hours. Anemia Panel: No results for input(s): VITAMINB12, FOLATE, FERRITIN, TIBC, IRON, RETICCTPCT in the last 72 hours. Sepsis Labs: Invalid input(s): PROCALCITONIN, LACTICIDVEN No results found for this or any previous visit (from the past 240 hour(s)).       Radiological Exams on Admission: Personally reviewed CT head report: Ct Head Wo Contrast  Result Date: 12/27/2016 CLINICAL DATA:  Headaches and difficulty sleeping. EXAM: CT HEAD WITHOUT CONTRAST TECHNIQUE: Contiguous axial images were obtained from the base of the skull through the vertex without intravenous contrast. COMPARISON:  09/17/2016 CT of the head FINDINGS: Brain: The brainstem, cerebellum, cerebral peduncles, thalami, basal ganglia, basilar cisterns, and ventricular system appear within normal limits. Periventricular white matter and corona radiata hypodensities favor chronic ischemic microvascular white matter disease. No intracranial hemorrhage, mass lesion, or acute CVA. Vascular: There is atherosclerotic calcification of the cavernous carotid arteries bilaterally. Skull: Unremarkable Sinuses/Orbits: Partial ethmoidectomies. 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 IMPRESSION: 1. No acute intracranial findings. 2. Periventricular white matter and corona radiata hypodensities favor chronic ischemic microvascular white matter disease. Electronically Signed   By: Van Clines M.D.   On: 12/27/2016 19:47    EKG: Independently reviewed. Rate 82, QTc normal, LVH, diffuse TW flattening, no  change from previous.      Assessment/Plan  1. Benzodiazepine withdrawal:  Symptoms started when Klonopin was stopped.   -Will score CIWA every 6 hours -Restart Klonopin at half dose -Discussed tapering this medicine with patient, who is reluctant given it helps her with migraines -Restart trazodone   2. Hypertensive urgency:  From untreated HTN (was previously on 3 agents, HCTZ stopped in Dec for concerns of dehydration, now thinks she is not taking amlodipine) and Klonopin withdrawal.  There is no clear cut end organ damage (ECG changes, troponin leak)(creatinine elevation may just be dehydration). -Treat Klonopin withdrawal -Restart amlodipine -Restart Valsartan if renal function improves -Labetalol for BP > 195/120  3. Elevated creatinine vs acute kidney  injury:  Baseline Cr 1.2 -Fluids -Trend BMP -Hold ARB for now -Hold all NSAIDs  4. Normocytic anemia:  Probably of renal disease.   -Continue iron  5. Anxiety/depression:  -Continue citalopram -Continue Klonopin, reduced dose as above  6. Chronic pain:  -Hold all NSAIDs -Continue gabapentin -Continue prednisone for now  Note: Patient told pharm tech she takes prednisone 20 daily since October, but she is not able to confirm this with certainty.  I will administer prednisone for now as if she IS taking it daily, and the Pharm dept will call her outpatient pharmacy to confirm in AM then page the daytime hospitalist to confirm.  7. Other medications:  -Continue statin     DVT prophylaxis: Lovenox, reduced dose  Code Status: FULL  Family Communication: None present  Disposition Plan: Anticipate IV fluids, restart low dose Klonopin for withdrawal, monitor HTN, repeat renal function.  If improving, likely home in 48 hours. Consults called: None Admission status: INPATIENT         Medical decision making: Patient seen at 11:15 PM on 12/27/2016.  The patient was discussed with Dr. Ellender Hose.  What exists of the  patient's chart was reviewed in depth and summarized above.  Clinical condition: requiring telemetry monitoring given degree of hypertension, and ?end organ damage to heart, kidneys.        Edwin Dada [facility] Pager [phone]     At the time of admission, it appears that the appropriate admission status for this patient is INPATIENT. This is judged to be reasonable and necessary in order to provide the required intensity of service to ensure the patient's safety given the presenting symptoms, physical exam findings, and initial radiographic and laboratory data in the context of their chronic comorbidities.  Together, these circumstances are felt to place her at high risk for further clinical deterioration threatening life, limb, or organ.   Patient requires inpatient status due to high intensity of service, high risk for further deterioration and high frequency of surveillance required because of this acute illness that poses a threat to life, limb or bodily function.  Factors that support inpatient status include: Combination of long-acting Benzo withdrawal, hypertensive urgency with unclear end organ effects, elevation of creatinine (unclear trend) and polypharmacy, given all of which it is my clinical judgment that the patient will require inpatient hospital care spanning beyond 2 midnights from the point of admission and that early discharge would result in unnecessary risk of decompensation and readmission or threat to life, limb or bodily function.

## 2016-12-27 NOTE — ED Notes (Signed)
Danford MD at bedside. 

## 2016-12-27 NOTE — ED Notes (Signed)
Pt transported to CT ?

## 2016-12-28 DIAGNOSIS — N184 Chronic kidney disease, stage 4 (severe): Secondary | ICD-10-CM | POA: Diagnosis present

## 2016-12-28 DIAGNOSIS — I160 Hypertensive urgency: Secondary | ICD-10-CM | POA: Diagnosis present

## 2016-12-28 DIAGNOSIS — F13239 Sedative, hypnotic or anxiolytic dependence with withdrawal, unspecified: Secondary | ICD-10-CM | POA: Diagnosis present

## 2016-12-28 DIAGNOSIS — G894 Chronic pain syndrome: Secondary | ICD-10-CM | POA: Diagnosis present

## 2016-12-28 DIAGNOSIS — I10 Essential (primary) hypertension: Secondary | ICD-10-CM

## 2016-12-28 DIAGNOSIS — F332 Major depressive disorder, recurrent severe without psychotic features: Secondary | ICD-10-CM | POA: Diagnosis not present

## 2016-12-28 DIAGNOSIS — F13939 Sedative, hypnotic or anxiolytic use, unspecified with withdrawal, unspecified: Secondary | ICD-10-CM | POA: Diagnosis present

## 2016-12-28 DIAGNOSIS — F13230 Sedative, hypnotic or anxiolytic dependence with withdrawal, uncomplicated: Secondary | ICD-10-CM | POA: Diagnosis not present

## 2016-12-28 DIAGNOSIS — D649 Anemia, unspecified: Secondary | ICD-10-CM | POA: Diagnosis present

## 2016-12-28 LAB — BASIC METABOLIC PANEL
Anion gap: 10 (ref 5–15)
BUN: 25 mg/dL — ABNORMAL HIGH (ref 6–20)
CO2: 15 mmol/L — ABNORMAL LOW (ref 22–32)
Calcium: 8.7 mg/dL — ABNORMAL LOW (ref 8.9–10.3)
Chloride: 114 mmol/L — ABNORMAL HIGH (ref 101–111)
Creatinine, Ser: 1.27 mg/dL — ABNORMAL HIGH (ref 0.44–1.00)
GFR calc Af Amer: 47 mL/min — ABNORMAL LOW (ref >60)
GFR calc non Af Amer: 41 mL/min — ABNORMAL LOW (ref >60)
Glucose, Bld: 171 mg/dL — ABNORMAL HIGH (ref 65–99)
Potassium: 4.4 mmol/L (ref 3.5–5.1)
Sodium: 139 mmol/L (ref 135–145)

## 2016-12-28 LAB — TROPONIN I
Troponin I: <0.03 ng/mL (ref <0.03)
Troponin I: <0.03 ng/mL (ref <0.03)

## 2016-12-28 MED ORDER — CLONAZEPAM 0.5 MG PO TABS
1.0000 mg | ORAL_TABLET | Freq: Two times a day (BID) | ORAL | Status: DC
  Administered 2016-12-28 – 2016-12-29 (×3): 1 mg via ORAL
  Filled 2016-12-28 (×3): qty 2

## 2016-12-28 MED ORDER — AMLODIPINE BESYLATE 10 MG PO TABS: 10.0000 mg | ORAL_TABLET | Freq: Every day | ORAL | 0 refills | Status: DC

## 2016-12-28 MED ORDER — FERROUS SULFATE 325 (65 FE) MG PO TABS
325.0000 mg | ORAL_TABLET | ORAL | Status: DC
  Administered 2016-12-29: 325 mg via ORAL
  Filled 2016-12-28: qty 1

## 2016-12-28 MED ORDER — SODIUM CHLORIDE 0.9% FLUSH
3.0000 mL | Freq: Two times a day (BID) | INTRAVENOUS | Status: DC
  Administered 2016-12-28: 3 mL via INTRAVENOUS

## 2016-12-28 MED ORDER — CLONAZEPAM 1 MG PO TABS: 1.0000 mg | ORAL_TABLET | Freq: Two times a day (BID) | ORAL | 0 refills | Status: DC

## 2016-12-28 MED ORDER — AMLODIPINE BESYLATE 10 MG PO TABS
10.0000 mg | ORAL_TABLET | Freq: Every day | ORAL | Status: DC
  Administered 2016-12-28 – 2016-12-29 (×3): 10 mg via ORAL
  Filled 2016-12-28 (×3): qty 1

## 2016-12-28 MED ORDER — PREDNISONE 20 MG PO TABS
20.0000 mg | ORAL_TABLET | Freq: Every day | ORAL | Status: DC
  Administered 2016-12-28: 20 mg via ORAL
  Filled 2016-12-28: qty 1

## 2016-12-28 MED ORDER — LORAZEPAM 2 MG/ML IJ SOLN
0.5000 mg | Freq: Once | INTRAMUSCULAR | Status: AC
  Administered 2016-12-28: 0.5 mg via INTRAVENOUS
  Filled 2016-12-28: qty 1

## 2016-12-28 MED ORDER — ACETAMINOPHEN 325 MG PO TABS: 650.0000 mg | ORAL_TABLET | Freq: Four times a day (QID) | ORAL | Status: DC | PRN

## 2016-12-28 MED ORDER — LABETALOL HCL 5 MG/ML IV SOLN: 5.0000 mg | Freq: Three times a day (TID) | INTRAVENOUS | Status: DC | PRN

## 2016-12-28 MED ORDER — CITALOPRAM HYDROBROMIDE 20 MG PO TABS
40.0000 mg | ORAL_TABLET | Freq: Every day | ORAL | Status: DC
  Administered 2016-12-28 – 2016-12-29 (×2): 40 mg via ORAL
  Filled 2016-12-28 (×2): qty 2

## 2016-12-28 MED ORDER — ENOXAPARIN SODIUM 30 MG/0.3ML ~~LOC~~ SOLN
30.0000 mg | SUBCUTANEOUS | Status: DC
  Administered 2016-12-28: 30 mg via SUBCUTANEOUS
  Filled 2016-12-28: qty 0.3

## 2016-12-28 MED ORDER — DIPHENHYDRAMINE HCL 25 MG PO CAPS: 25.0000 mg | ORAL_CAPSULE | Freq: Four times a day (QID) | ORAL | Status: DC | PRN

## 2016-12-28 MED ORDER — ENSURE ENLIVE PO LIQD
237.0000 mL | Freq: Two times a day (BID) | ORAL | Status: DC
  Administered 2016-12-28 – 2016-12-29 (×4): 237 mL via ORAL
  Filled 2016-12-28: qty 237

## 2016-12-28 MED ORDER — ONDANSETRON HCL 4 MG PO TABS: 4.0000 mg | ORAL_TABLET | Freq: Four times a day (QID) | ORAL | Status: DC | PRN

## 2016-12-28 MED ORDER — ROSUVASTATIN CALCIUM 10 MG PO TABS
10.0000 mg | ORAL_TABLET | Freq: Every day | ORAL | Status: DC
  Administered 2016-12-28: 10 mg via ORAL
  Filled 2016-12-28: qty 1

## 2016-12-28 MED ORDER — TRAZODONE HCL 50 MG PO TABS
50.0000 mg | ORAL_TABLET | Freq: Every evening | ORAL | Status: DC | PRN
  Administered 2016-12-28: 50 mg via ORAL
  Filled 2016-12-28: qty 1

## 2016-12-28 MED ORDER — ACETAMINOPHEN 650 MG RE SUPP: 650.0000 mg | Freq: Four times a day (QID) | RECTAL | Status: DC | PRN

## 2016-12-28 MED ORDER — SODIUM CHLORIDE 0.9 % IV SOLN
INTRAVENOUS | Status: DC
  Administered 2016-12-28: 13:00:00 via INTRAVENOUS
  Administered 2016-12-28: 1 mL via INTRAVENOUS

## 2016-12-28 MED ORDER — ACETAMINOPHEN 325 MG PO TABS
650.0000 mg | ORAL_TABLET | Freq: Four times a day (QID) | ORAL | Status: DC | PRN
  Administered 2016-12-28 – 2016-12-29 (×2): 650 mg via ORAL
  Filled 2016-12-28 (×2): qty 2

## 2016-12-28 MED ORDER — TRAMADOL HCL 50 MG PO TABS
50.0000 mg | ORAL_TABLET | Freq: Four times a day (QID) | ORAL | Status: DC | PRN
  Administered 2016-12-28 (×2): 50 mg via ORAL
  Filled 2016-12-28 (×2): qty 1

## 2016-12-28 MED ORDER — GABAPENTIN 100 MG PO CAPS
100.0000 mg | ORAL_CAPSULE | Freq: Three times a day (TID) | ORAL | Status: DC
  Administered 2016-12-28 – 2016-12-29 (×5): 100 mg via ORAL
  Filled 2016-12-28 (×5): qty 1

## 2016-12-28 MED ORDER — ONDANSETRON HCL 4 MG/2ML IJ SOLN: 4.0000 mg | Freq: Four times a day (QID) | INTRAMUSCULAR | Status: DC | PRN

## 2016-12-28 NOTE — Discharge Instructions (Signed)
Acute Kidney Injury, Adult Acute kidney injury is a sudden worsening of kidney function. The kidneys are organs that have several jobs. They filter the blood to remove waste products and extra fluid. They also maintain a healthy balance of minerals and hormones in the body, which helps control blood pressure and keep bones strong. With this condition, your kidneys do not do their jobs as well as they should. This condition ranges from mild to severe. Over time it may develop into long-lasting (chronic) kidney disease. Early detection and treatment may prevent acute kidney injury from developing into a chronic condition. What are the causes? Common causes of this condition include:  A problem with blood flow to the kidneys. This may be caused by:  Low blood pressure (hypotension) or shock.  Blood loss.  Heart and blood vessel (cardiovascular) disease.  Severe burns.  Liver disease.  Direct damage to the kidneys. This may be caused by:  Certain medicines.  A kidney infection.  Poisoning.  Being around or in contact with toxic substances.  A surgical wound.  A hard, direct hit to the kidney area.  A sudden blockage of urine flow. This may be caused by:  Cancer.  Kidney stones.  An enlarged prostate in males. What are the signs or symptoms? Symptoms of this condition may not be obvious until the condition becomes severe. Symptoms of this condition can include:  Tiredness (lethargy), or difficulty staying awake.  Nausea or vomiting.  Swelling (edema) of the face, legs, ankles, or feet.  Problems with urination, such as:  Abdominal pain, or pain along the side of your stomach (flank).  Decreased urine production.  Decrease in the force of urine flow.  Muscle twitches and cramps, especially in the legs.  Confusion or trouble concentrating.  Loss of appetite.  Fever. How is this diagnosed? This condition may be diagnosed with tests, including:  Blood  tests.  Urine tests.  Imaging tests.  A test in which a sample of tissue is removed from the kidneys to be examined under a microscope (kidney biopsy). How is this treated? Treatment for this condition depends on the cause and how severe the condition is. In mild cases, treatment may not be needed. The kidneys may heal on their own. In more severe cases, treatment will involve:  Treating the cause of the kidney injury. This may involve changing any medicines you are taking or adjusting your dosage.  Fluids. You may need specialized IV fluids to balance your body's needs.  Having a catheter placed to drain urine and prevent blockages.  Preventing problems from occurring. This may mean avoiding certain medicines or procedures that can cause further injury to the kidneys. In some cases treatment may also require:  A procedure to remove toxic wastes from the body (dialysis or continuous renal replacement therapy - CRRT).  Surgery. This may be done to repair a torn kidney, or to remove the blockage from the urinary system. Follow these instructions at home: Medicines   Take over-the-counter and prescription medicines only as told by your health care provider.  Do not take any new medicines without your health care provider's approval. Many medicines can worsen your kidney damage.  Do not take any vitamin and mineral supplements without your health care provider's approval. Many nutritional supplements can worsen your kidney damage. Lifestyle   If your health care provider prescribed changes to your diet, follow them. You may need to decrease the amount of protein you eat.  Achieve and maintain a   healthy weight. If you need help with this, ask your health care provider.  Start or continue an exercise plan. Try to exercise at least 30 minutes a day, 5 days a week.  Do not use any tobacco products, such as cigarettes, chewing tobacco, and e-cigarettes. If you need help quitting, ask  your health care provider. General instructions   Keep track of your blood pressure. Report changes in your blood pressure as told by your health care provider.  Stay up to date with immunizations. Ask your health care provider which immunizations you need.  Keep all follow-up visits as told by your health care provider. This is important. Where to find more information:  American Association of Kidney Patients: www.aakp.org  National Kidney Foundation: www.kidney.org  American Kidney Fund: www.akfinc.org  [employer] Program:  www.lifeoptions.org  www.kidneyschool.org Contact a health care provider if:  Your symptoms get worse.  You develop new symptoms. Get help right away if:  You develop symptoms of worsening kidney disease, which include:  Headaches.  Abnormally dark or light skin.  Easy bruising.  Frequent hiccups.  Chest pain.  Shortness of breath.  End of menstruation in women.  Seizures.  Confusion or altered mental status.  Abdominal or back pain.  Itchiness.  You have a fever.  Your body is producing less urine.  You have pain or bleeding when you urinate. Summary  Acute kidney injury is a sudden worsening of kidney function.  Acute kidney injury can be caused by problems with blood flow to the kidneys, direct damage to the kidneys, and sudden blockage of urine flow.  Symptoms of this condition may not be obvious until it becomes severe. Symptoms may include edema, lethargy, confusion, nausea or vomiting, and problems passing urine.  This condition can usually be diagnosed with blood tests, urine tests, and imaging tests. Sometimes a kidney biopsy is done to diagnose this condition.  Treatment for this condition often involves treating the underlying cause. It is treated with fluids, medicines, dialysis, diet changes, or surgery. This information is not intended to replace advice given to you by your health care provider.  Make sure you discuss any questions you have with your health care provider. Document Released: 04/19/2011 Document Revised: 09/24/2016 Document Reviewed: 09/24/2016 [employer] Interactive Patient Education  2017 [employer]  

## 2016-12-28 NOTE — Discharge Summary (Addendum)
Physician Discharge Summary  Alyssa [name] WUX:[id number] DOB: 06-Jan-1941 DOA: 12/27/2016  PCP: Alyssa Pillow, NP  Admit date: 12/27/2016 Discharge date: 12/29/2016  Recommendations for Outpatient Follow-up:  Hold Diovan due to renal insufficiency Use Norvasc for blood pressure control   Discharge Diagnoses:  Principal Problem:   Benzodiazepine withdrawal without complication ([name]) Active Problems:   Severe recurrent major depression without psychotic features ([name])   Essential hypertension   Hypertensive urgency   Elevated serum creatinine   Normocytic anemia   Chronic pain syndrome   Benzodiazepine withdrawal ([name])    Discharge Condition: stable   Diet recommendation: as tolerated   History of present illness:   Per HPI " 76 y.o. female with a past medical history significant for migraines, anxiety/panic d/o, HTN, chronic arthritis pain who presents with 1 week headaches, anxiety, tremor, and insomnia after abrupt cessation of Klonopin. The patient was in her usual state of health until about 1 week ago when she ran out of Klonopin (2 mg twice daily).  She claims her drugstore was calling the wrong doctor's office for refills and she didn't know, and the problem wasn't detected until she ran out one week ago.  Since running out, she has been having increased anxiety, inability to sleep, tremor, headaches, and malaise.  Today, this wasn't getting better even with naproxen so she came to the ER.  She has had no chest pain, dyspnea, LOC, seizures, focal weakness or numbness."  BP on admission was 249/124. CR was 1.68 (baseline 1.2). CT head showed only microvascular changes, The 12 lead EKG showed LVH, no change from previous, normal ST segments. Pt was given Reglan and Benadryl for migraine, labetalol for hypertension and started on Ativan for suspected benzodiazepine withdrawal.  Hospital Course:   Principal Problem:   Benzodiazepine withdrawal without  complication (HCC) - Much better this am - Will continue clonazepam 1 mg BID instead of 2 mg at bedtime   Active Problems:   Severe recurrent major depression without psychotic features ([name]) - Continue home meds    Essential hypertension - Use Norvasc for blood pressure control - Diovan on hold due to renal insufficiency    CKD stage 3 - Baseline Cr 1.2 and on this admission 1.6 - Likely from [name] remains on hold - Cr better    Signed:  [name], MD  [facility] 12/28/2016, 4:38 PM  Pager #: [phone]  Time spent in minutes: less than 30 minutes  Procedures:  None   Consultations:  None   Discharge Exam: Vitals:   12/28/16 0838 12/28/16 1201  BP: (!) 152/98 (!) 156/82  Pulse: 92 91  Resp:  16  Temp:     Vitals:   12/28/16 0356 12/28/16 0804 12/28/16 0838 12/28/16 1201  BP: (!) 181/92 (!) 164/89 (!) 152/98 (!) 156/82  Pulse:  88 92 91  Resp:  18  16  Temp:  98.3 F (36.8 C)    TempSrc:  Oral    SpO2:  96%  98%  Weight:      Height:        General: Pt is alert, follows commands appropriately, not in acute distress Cardiovascular: Regular rate and rhythm, S1/S2 + Respiratory: Clear to auscultation bilaterally, no wheezing, no crackles, no rhonchi Abdominal: Soft, non tender, non distended, bowel sounds +, no guarding Extremities: no cyanosis, pulses palpable bilaterally DP and PT Neuro: Grossly nonfocal  Discharge Instructions  Discharge Instructions    Call MD for:  persistant nausea  and vomiting    Complete by:  As directed    Call MD for:  redness, tenderness, or signs of infection (pain, swelling, redness, odor or green/yellow discharge around incision site)    Complete by:  As directed    Call MD for:  severe uncontrolled pain    Complete by:  As directed    Diet - low sodium heart healthy    Complete by:  As directed    Discharge instructions    Complete by:  As directed    Hold Diovan due to renal  insufficiency. Use Norvasc 5 mg a day for blood pressure control.   Increase activity slowly    Complete by:  As directed      Allergies as of 12/28/2016   No Known Allergies     Medication List    STOP taking these medications   predniSONE 10 MG tablet Commonly known as:  DELTASONE   valsartan 320 MG tablet Commonly known as:  DIOVAN     TAKE these medications   acetaminophen 325 MG tablet Commonly known as:  TYLENOL Take 2 tablets (650 mg total) by mouth every 6 (six) hours as needed for mild pain (or Fever >/= 101).   amLODipine 10 MG tablet Commonly known as:  NORVASC Take 1 tablet (10 mg total) by mouth daily. Start taking on:  12/29/2016   citalopram 40 MG tablet Commonly known as:  CELEXA Take 1 tablet (40 mg total) by mouth daily. What changed:  when to take this   clonazePAM 1 MG tablet Commonly known as:  KLONOPIN Take 1 tablet (1 mg total) by mouth 2 (two) times daily. What changed:  medication strength  how much to take  when to take this   diphenhydrAMINE 25 mg capsule Commonly known as:  BENADRYL Take 1 capsule (25 mg total) by mouth every 6 (six) hours as needed for itching.   feeding supplement (ENSURE ENLIVE) Liqd Take 237 mLs by mouth 2 (two) times daily between meals.   ferrous sulfate 325 (65 FE) MG tablet Take 1 tablet (325 mg total) by mouth every Monday, Wednesday, and Friday. With breakfast   gabapentin 100 MG capsule Commonly known as:  NEURONTIN Take 1 capsule (100 mg total) by mouth 3 (three) times daily.   multivitamin with minerals Tabs tablet Take 1 tablet by mouth daily. One a Day Women's   polyethylene glycol packet Commonly known as:  MIRALAX / GLYCOLAX Take 17 g by mouth daily. What changed:  when to take this  reasons to take this  additional instructions   rosuvastatin 10 MG tablet Commonly known as:  CRESTOR Take 10 mg by mouth daily.   traZODone 50 MG tablet Commonly known as:  DESYREL Take 1 tablet (50  mg total) by mouth at bedtime as needed for sleep.      Follow-up Information    [place], [name], NP. Schedule an appointment as soon as possible for a visit in 1 week(s).   Contact information: [facility] [place] [address] [phone]            The results of significant diagnostics from this hospitalization (including imaging, microbiology, ancillary and laboratory) are listed below for reference.    Significant Diagnostic Studies: Ct Head Wo Contrast  Result Date: 12/27/2016 CLINICAL DATA:  Headaches and difficulty sleeping. EXAM: CT HEAD WITHOUT CONTRAST TECHNIQUE: Contiguous axial images were obtained from the base of the skull through the vertex without  intravenous contrast. COMPARISON:  09/17/2016 CT of the head FINDINGS: Brain: The brainstem, cerebellum, cerebral peduncles, thalami, basal ganglia, basilar cisterns, and ventricular system appear within normal limits. Periventricular white matter and corona radiata hypodensities favor chronic ischemic microvascular white matter disease. No intracranial hemorrhage, mass lesion, or acute CVA. Vascular: There is atherosclerotic calcification of the cavernous carotid arteries bilaterally. Skull: Unremarkable Sinuses/Orbits: Partial ethmoidectomies. 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 IMPRESSION: 1. No acute intracranial findings. 2. Periventricular white matter and corona radiata hypodensities favor chronic ischemic microvascular white matter disease. Electronically Signed   By: Van Clines M.D.   On: 12/27/2016 19:47   Xr C-arm No Report  Result Date: 12/23/2016 Please see Notes or Procedures tab for imaging impression.  Xr Shoulder Right  Result Date: 12/06/2016 Severe rotator cuff arthropathy   Microbiology: No results found for this or any previous visit (from the past 240 hour(s)).   Labs: Basic Metabolic Panel:  Recent Labs Lab 12/27/16 1918  12/28/16 0912  NA 142 139  K 4.2 4.4  CL 114* 114*  CO2 18* 15*  GLUCOSE 113* 171*  BUN 32* 25*  CREATININE 1.68* 1.27*  CALCIUM 9.3 8.7*   Liver Function Tests: No results for input(s): AST, ALT, ALKPHOS, BILITOT, PROT, ALBUMIN in the last 168 hours. No results for input(s): LIPASE, AMYLASE in the last 168 hours. No results for input(s): AMMONIA in the last 168 hours. CBC:  Recent Labs Lab 12/27/16 1918  WBC 10.5  HGB 9.8*  HCT 30.0*  MCV 82.4  PLT 567*   Cardiac Enzymes:  Recent Labs Lab 12/27/16 1918 12/28/16 0056 12/28/16 0912  TROPONINI <0.03 <0.03 <0.03   BNP: BNP (last 3 results) No results for input(s): BNP in the last 8760 hours.  ProBNP (last 3 results) No results for input(s): PROBNP in the last 8760 hours.  CBG: No results for input(s): GLUCAP in the last 168 hours.

## 2016-12-28 NOTE — Progress Notes (Signed)
Advance Beneficiary Notice of Noncoverage (ABN) given to the patient as requested by the UR Reviewer/ and Medical Director Dr Burnard Bunting. Letter explained to the patient, all questions answered; Aneta Mins [phone]

## 2016-12-28 NOTE — Progress Notes (Signed)
Pt called out for pain medication for HA.  RN walked into room and pt is asleep snoring.  RN woke pt up to ask for a pain score.  Pt states pain is 9/10.  Will give ultram and continue to monitor.

## 2016-12-28 NOTE — Care Management CC44 (Signed)
Condition Code 44 Documentation Completed  Patient Details  Name: RHEN DOSSANTOS MRN: [id number] Date of Birth: 10-Dec-1940   Condition Code 44 given:   yes Patient signature on Condition Code 44 notice:   yes Documentation of 2 MD's agreement:   yes Code 44 added to claim:   yes    Royston Bake, RN 12/28/2016, 3:56 PM

## 2016-12-28 NOTE — Care Management Obs Status (Signed)
[place] NOTIFICATION   Patient Details  Name: Alyssa [name] MRN: [id number] Date of Birth: 1941-07-24   Medicare Observation Status Notification Given:  Yes    Royston Bake, RN 12/28/2016, 3:56 PM

## 2016-12-29 DIAGNOSIS — F13239 Sedative, hypnotic or anxiolytic dependence with withdrawal, unspecified: Secondary | ICD-10-CM | POA: Diagnosis not present

## 2016-12-29 LAB — CBC
HCT: 27.3 % — ABNORMAL LOW (ref 36.0–46.0)
Hemoglobin: 9.1 g/dL — ABNORMAL LOW (ref 12.0–15.0)
MCH: 27.4 pg (ref 26.0–34.0)
MCHC: 33.3 g/dL (ref 30.0–36.0)
MCV: 82.2 fL (ref 78.0–100.0)
Platelets: 533 10*3/uL — ABNORMAL HIGH (ref 150–400)
RBC: 3.32 MIL/uL — ABNORMAL LOW (ref 3.87–5.11)
RDW: 18.7 % — ABNORMAL HIGH (ref 11.5–15.5)
WBC: 9.2 10*3/uL (ref 4.0–10.5)

## 2016-12-29 LAB — BASIC METABOLIC PANEL
Anion gap: 4 — ABNORMAL LOW (ref 5–15)
BUN: 21 mg/dL — ABNORMAL HIGH (ref 6–20)
CO2: 20 mmol/L — ABNORMAL LOW (ref 22–32)
Calcium: 8.9 mg/dL (ref 8.9–10.3)
Chloride: 113 mmol/L — ABNORMAL HIGH (ref 101–111)
Creatinine, Ser: 1.04 mg/dL — ABNORMAL HIGH (ref 0.44–1.00)
GFR calc Af Amer: >60 mL/min (ref >60)
GFR calc non Af Amer: 52 mL/min — ABNORMAL LOW (ref >60)
Glucose, Bld: 107 mg/dL — ABNORMAL HIGH (ref 65–99)
Potassium: 5.2 mmol/L — ABNORMAL HIGH (ref 3.5–5.1)
Sodium: 137 mmol/L (ref 135–145)

## 2016-12-29 LAB — POTASSIUM: Potassium: 4.8 mmol/L (ref 3.5–5.1)

## 2016-12-29 MED ORDER — TRAMADOL HCL 50 MG PO TABS
50.0000 mg | ORAL_TABLET | Freq: Once | ORAL | Status: AC
  Administered 2016-12-29: 50 mg via ORAL
  Filled 2016-12-29: qty 1

## 2016-12-29 MED ORDER — SODIUM POLYSTYRENE SULFONATE 15 GM/60ML PO SUSP
15.0000 g | Freq: Once | ORAL | Status: AC
  Administered 2016-12-29: 15 g via ORAL
  Filled 2016-12-29: qty 60

## 2016-12-29 NOTE — Progress Notes (Signed)
Pt seen and examined at the bedside.  Pt is medically stable for discharge home today. Repeat potassium is WNL. Please refer to discharge summary completed 12/28/2016. No changes in medication since 12/28/2016.  [name] [facility] [phone]

## 2016-12-29 NOTE — Progress Notes (Signed)
Patient stable throughout the night, VS stable. Patient is noncompliant with bed alarm, but says she forgets to call first.

## 2017-01-19 ENCOUNTER — Other Ambulatory Visit (INDEPENDENT_AMBULATORY_CARE_PROVIDER_SITE_OTHER): Payer: Self-pay | Admitting: Orthopaedic Surgery

## 2017-01-19 NOTE — Telephone Encounter (Signed)
Please advise 

## 2017-05-04 IMAGING — CT CT HEAD W/O CM
3 of 4 series · 18 of 47 positions shown, 21 images · non-contrast
Comparison: Head CT dated 09/12/2012

CLINICAL DATA: 74-year-old female with headache.

EXAM:
CT HEAD WITHOUT CONTRAST
TECHNIQUE: Contiguous axial images were obtained from the base of the skull
through the vertex without intravenous contrast.

[Series 201: head w/o, idose (1) · axial · non-contrast · 0.40mm/px · z∈[+83,+213]mm · 12 of 32 slices shown, 15 images]
[im 3/32  brain]
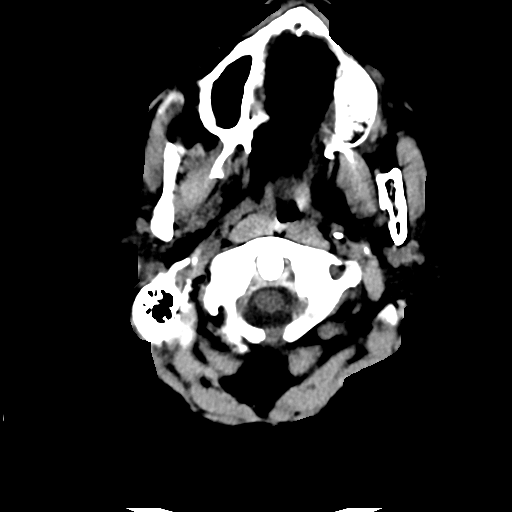
[im 3/32  bone]
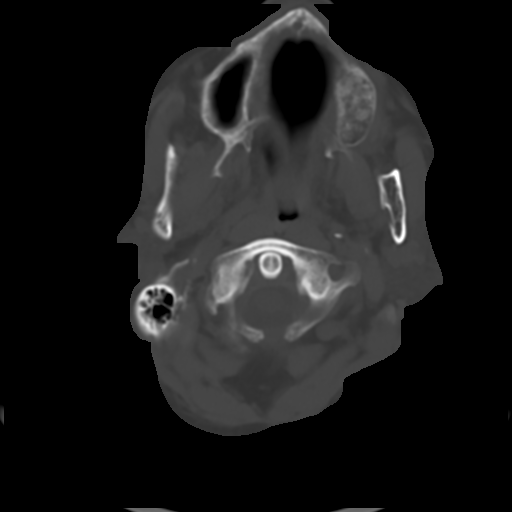
[im 5/32  brain]
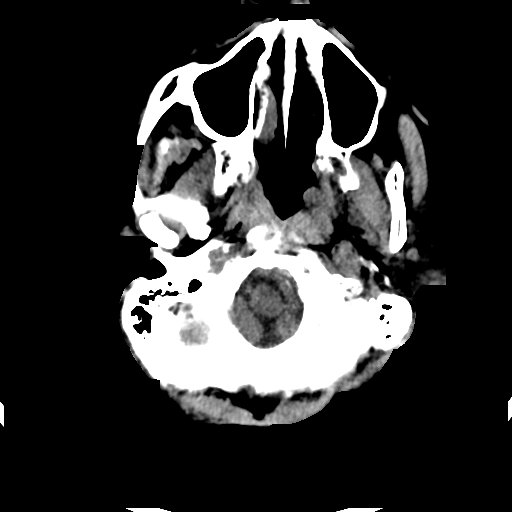
[im 7/32  brain]
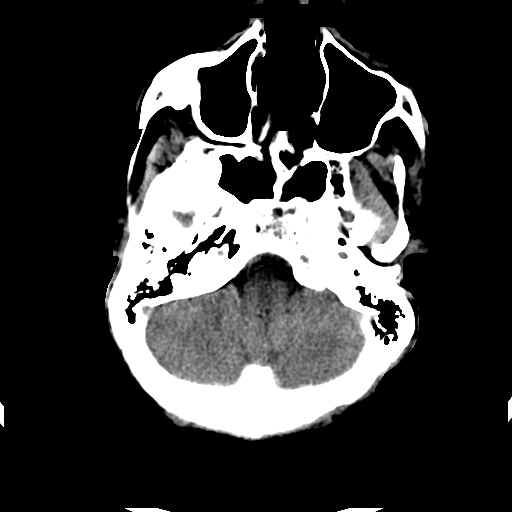
[im 9/32  brain]
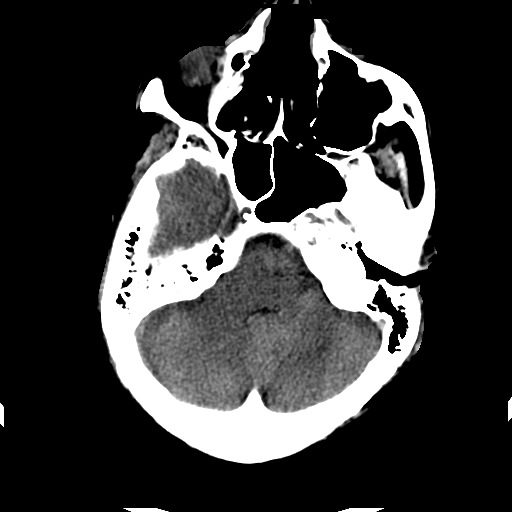
[im 12/32  brain]
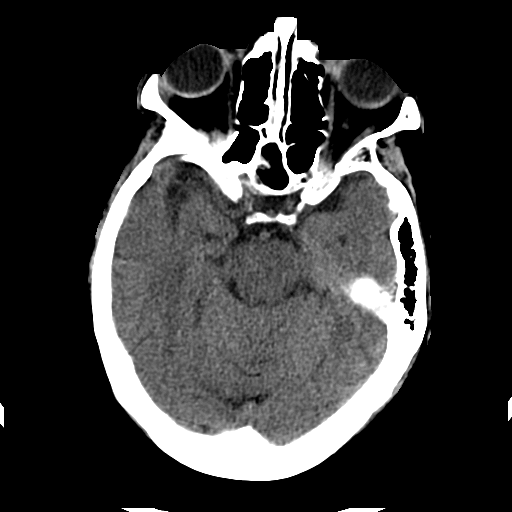
[im 12/32  bone]
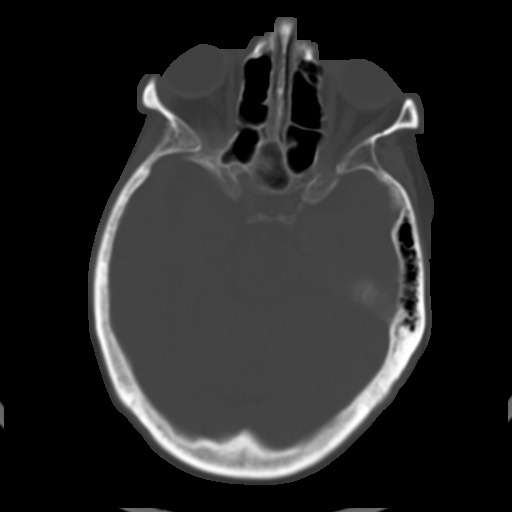
[im 14/32  brain]
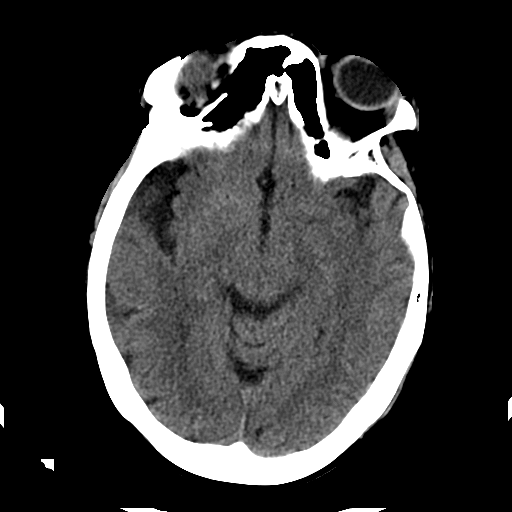
[im 18/32  brain]
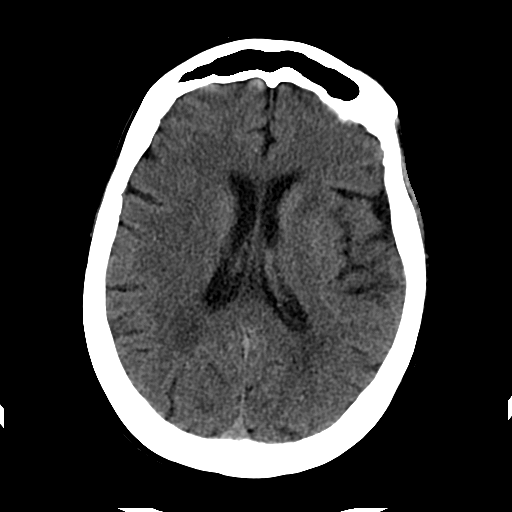
[im 20/32  brain]
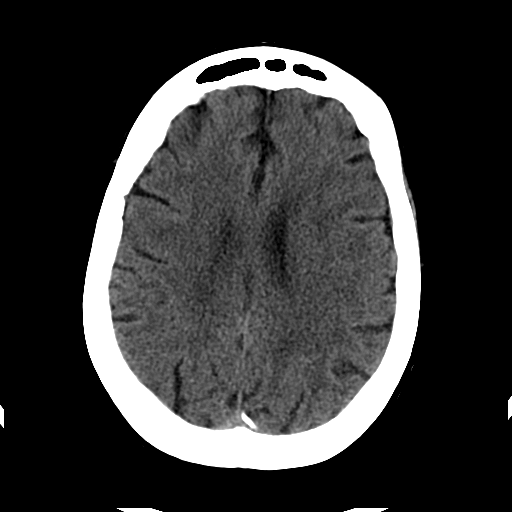
[im 23/32  brain]
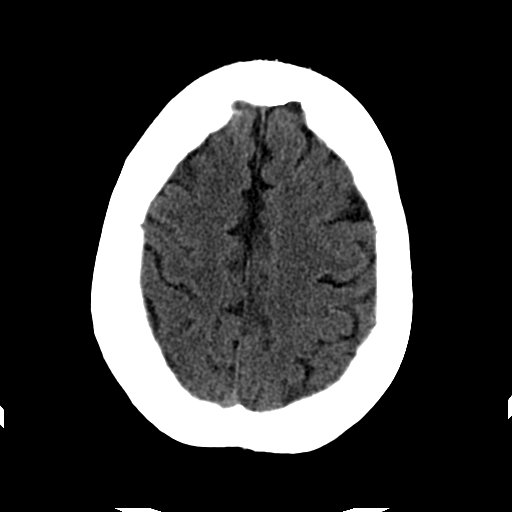
[im 23/32  bone]
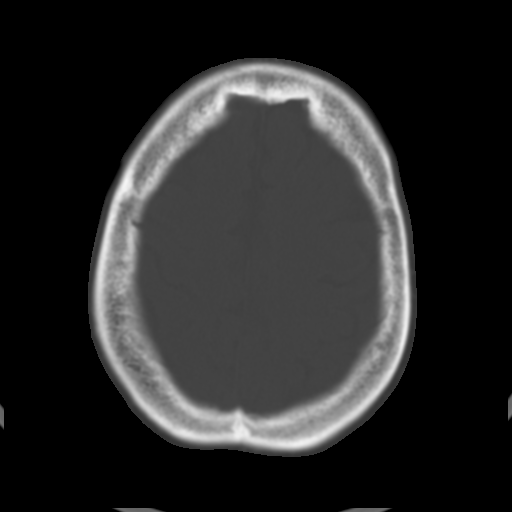
[im 25/32  brain]
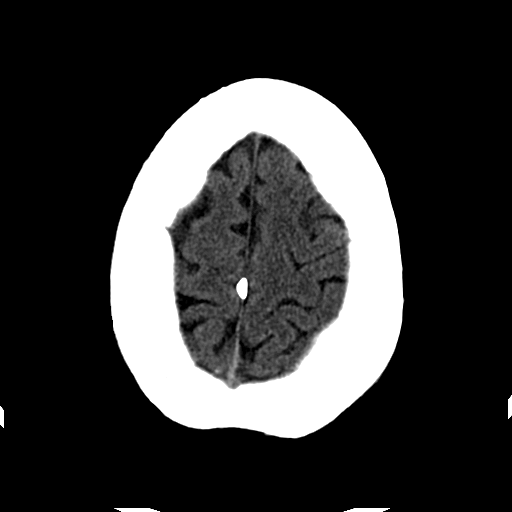
[im 27/32  brain]
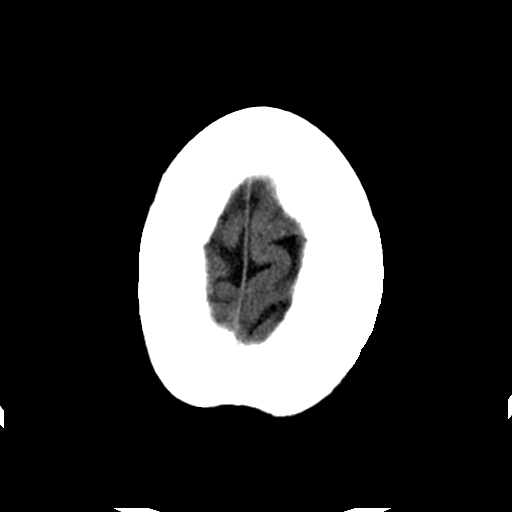
[im 29/32  brain]
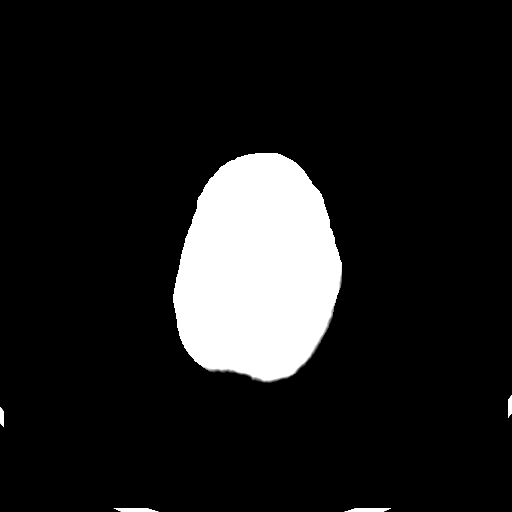

[Series 203: coronal st, idose (1) · coronal · 0.40mm/px · 3 of 68 slices shown]
[im 23/68  brain]
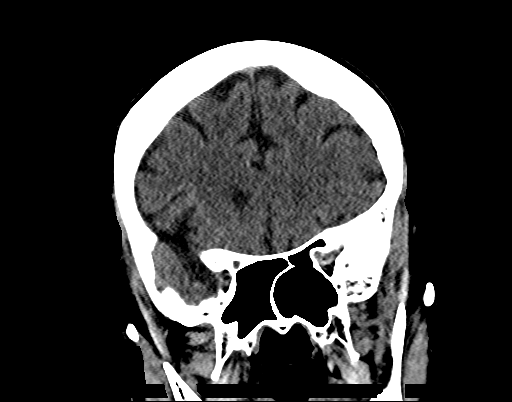
[im 30/68  brain]
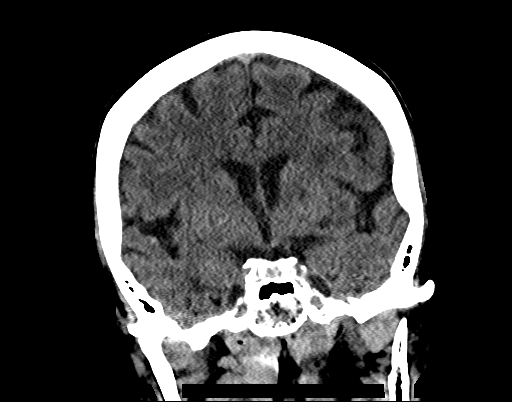
[im 38/68  brain]
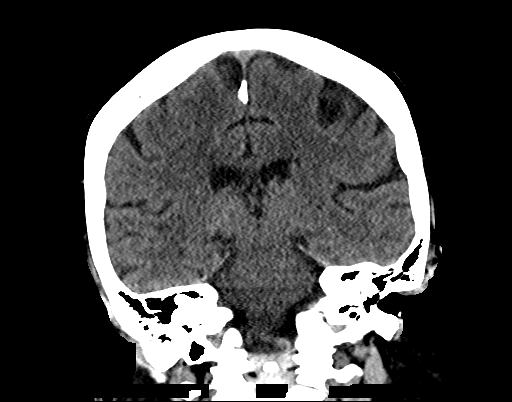

[Series 204: sagittal st, idose (1) · sagittal · 0.40mm/px · 3 of 68 slices shown]
[im 23/68  brain]
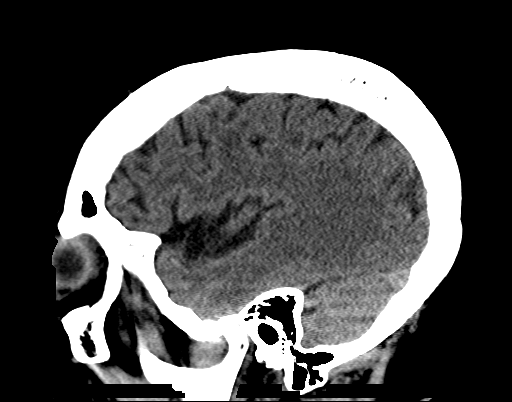
[im 34/68  brain]
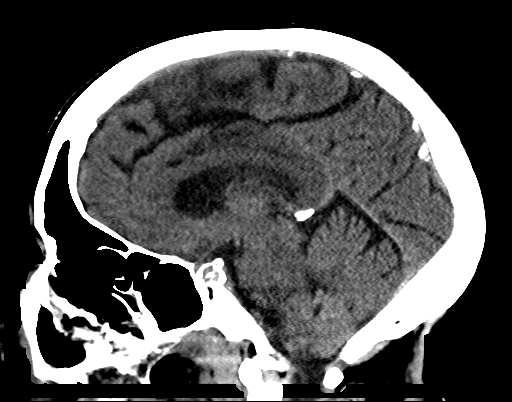
[im 45/68  brain]
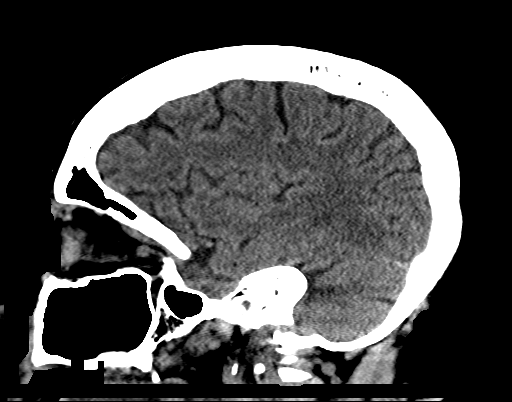

[18 of 47 positions shown; findings below may reference images not displayed]

FINDINGS: There is mild prominence of the ventricles and sulci compatible with
age-related atrophy. Mild periventricular and deep white matter
chronic microvascular ischemic changes noted. There is no acute
intracranial hemorrhage. No mass effect or midline shift noted.

The visualized paranasal sinuses and mastoid air cells are clear.
The calvarium is intact.
IMPRESSION: No acute intracranial hemorrhage.

Mild age-related atrophy and chronic microvascular ischemic disease.

If symptoms persist and there are no contraindications, MRI may
provide better evaluation if clinically indicated

## 2017-06-02 IMAGING — CR DG FOOT COMPLETE 3+V*L*
3 series · 3 of 3 positions shown · non-contrast
Comparison: None.

CLINICAL DATA: Medial left foot pain for 3 days. Distal foot and
toe pain.

EXAM:
LEFT FOOT - COMPLETE 3+ VIEW

[foot ap]
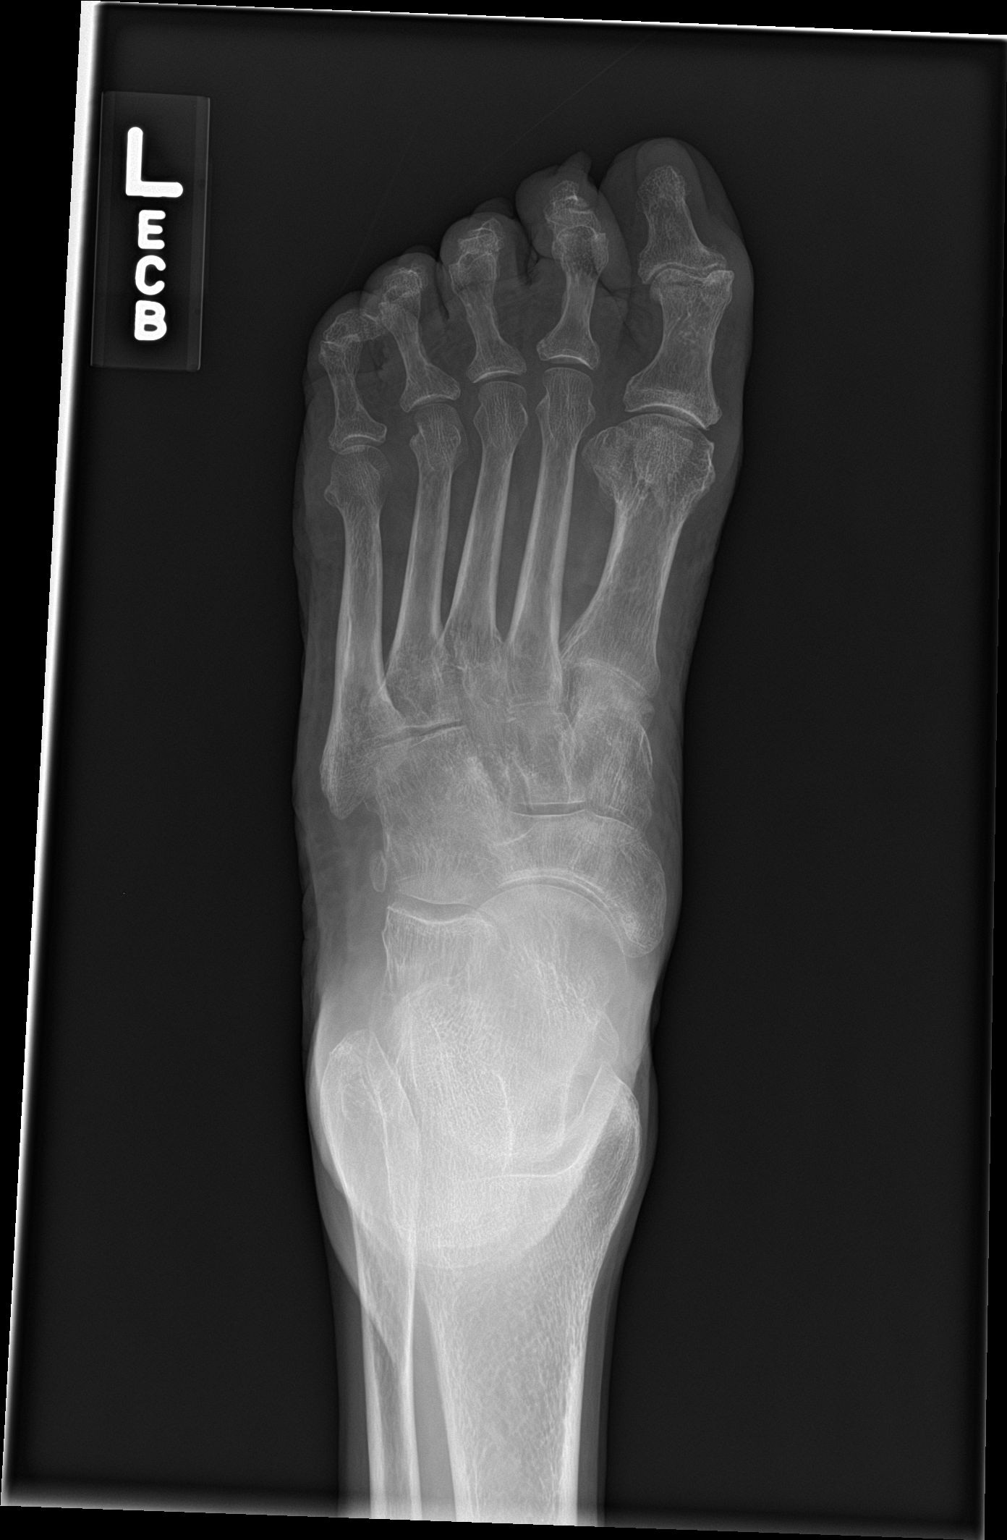

[foot obl]
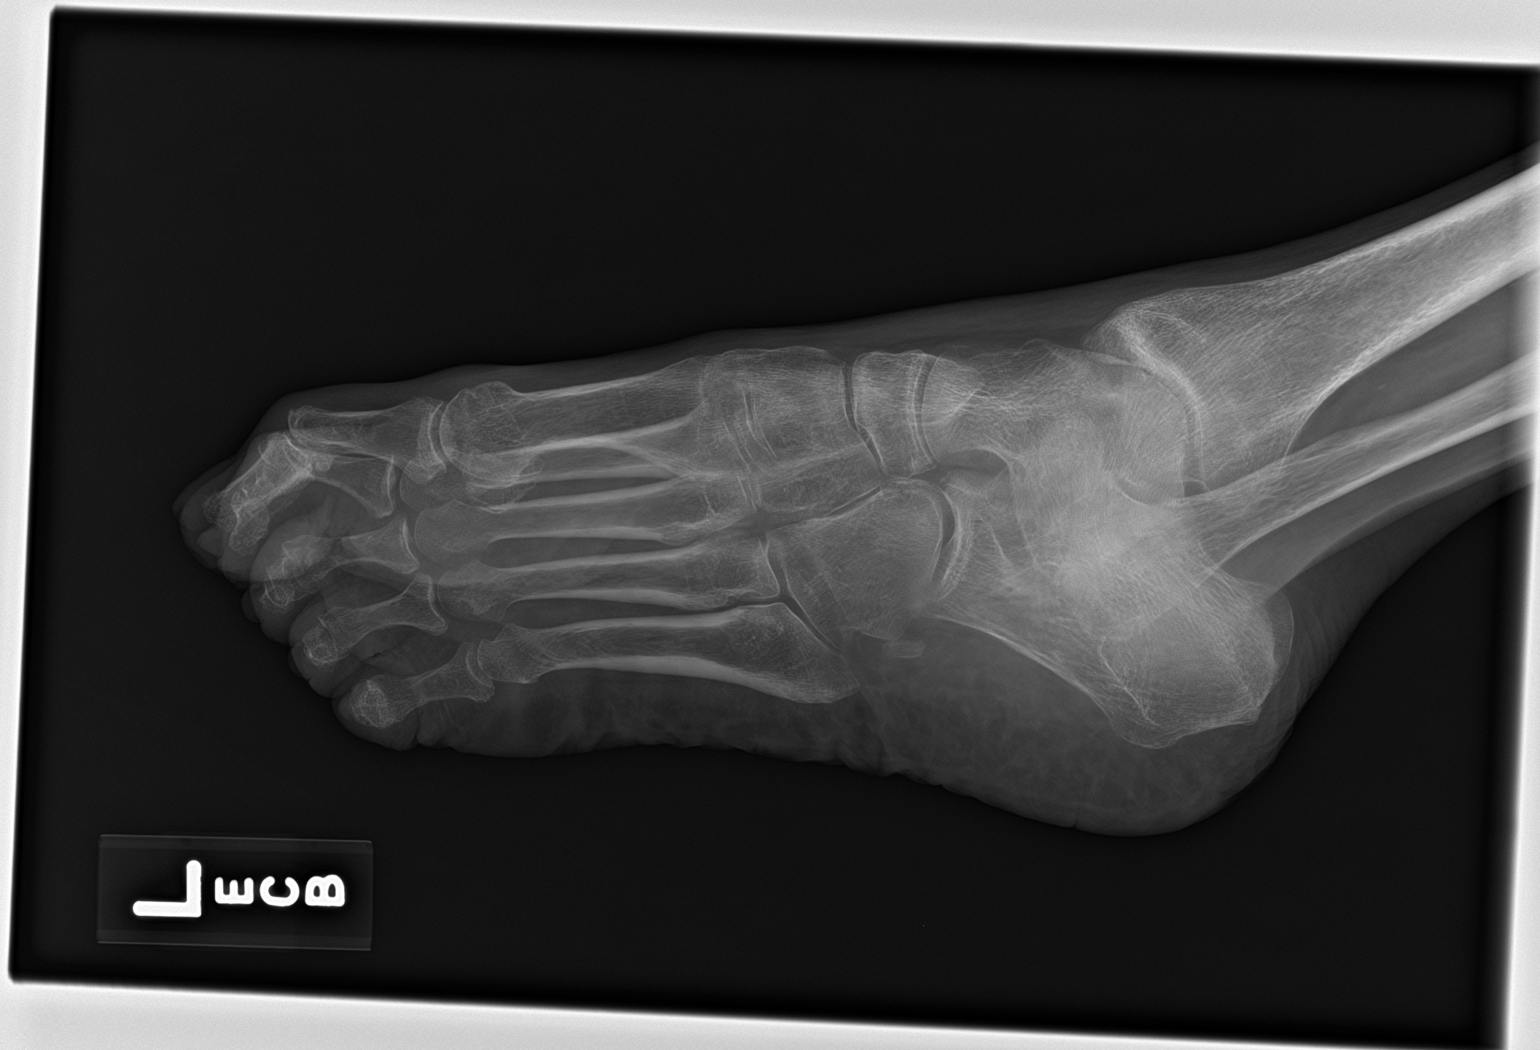

[foot lat]
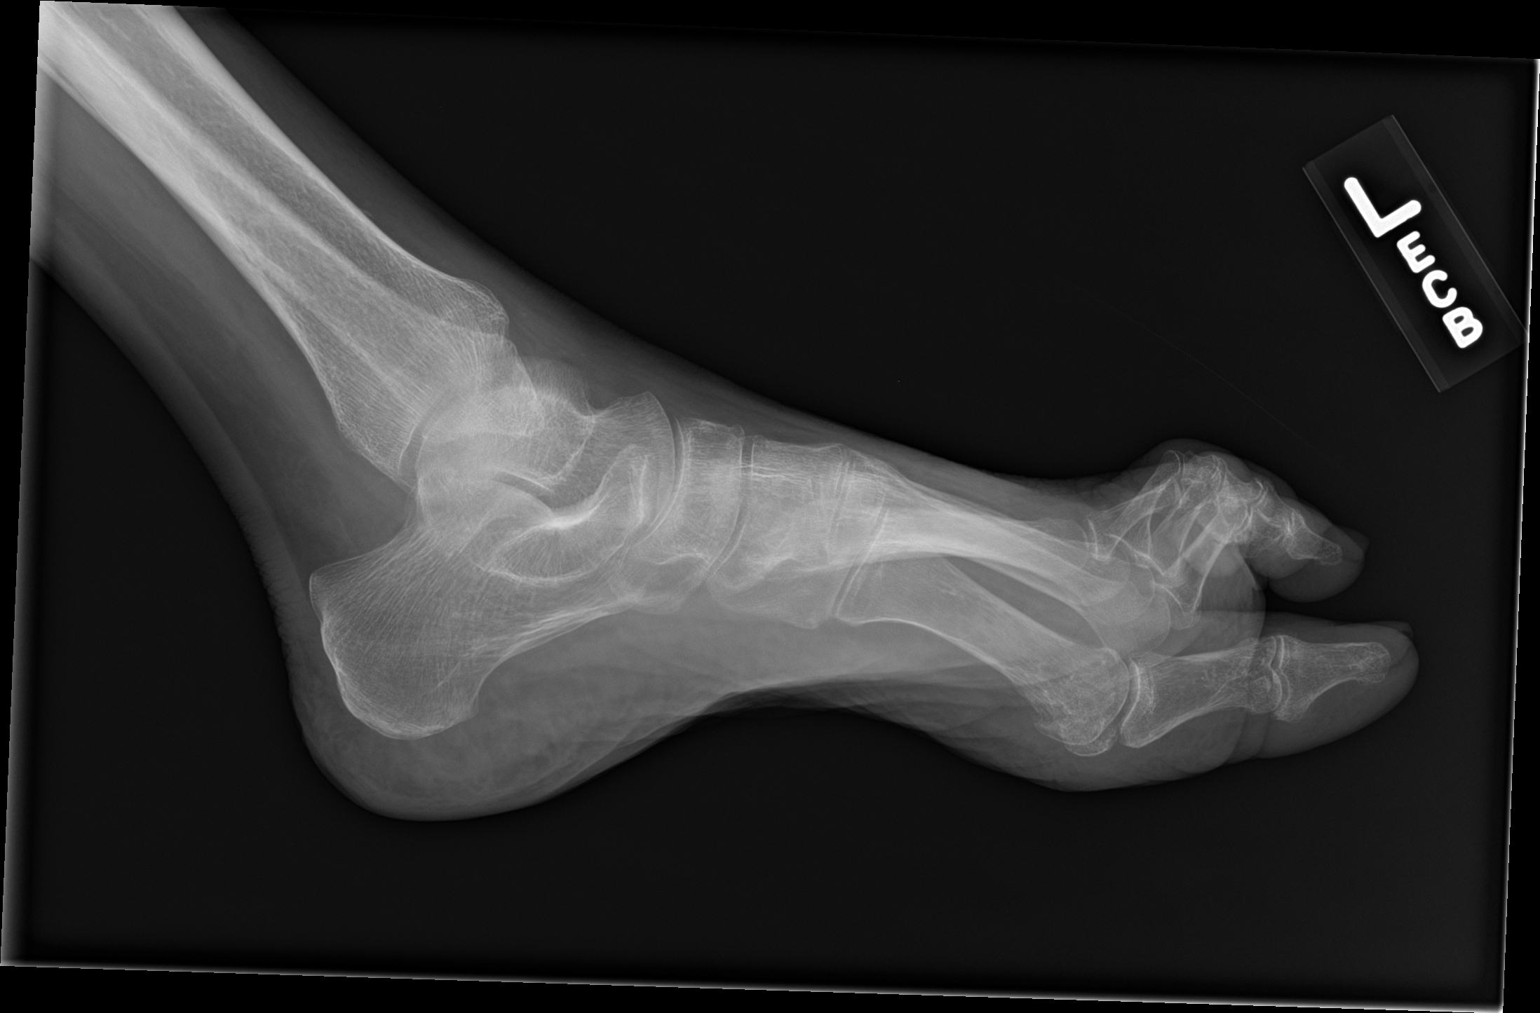

[3 of 3 positions shown; findings below may reference images not displayed]

FINDINGS: There is no evidence of fracture or dislocation. Hammertoe deformity
of the digits. Scattered osteoarthritis throughout the midfoot,
great toe, and digits. No evidence of inflammatory arthropathy.
Bones appear under mineralized diffusely. No localizing soft tissue
abnormality.
IMPRESSION: No acute bony abnormality. Scattered osteoarthritis. Hammertoe
deformity of the digits.

## 2017-06-02 IMAGING — CR DG CHEST 2V
2 series · 2 of 2 positions shown · non-contrast
Comparison: Chest x-ray 08/24/2015.

CLINICAL DATA: 74-year-old female with history of syncope and
dyspnea for 1 day.

EXAM:
CHEST  2 VIEW

[chest lat]
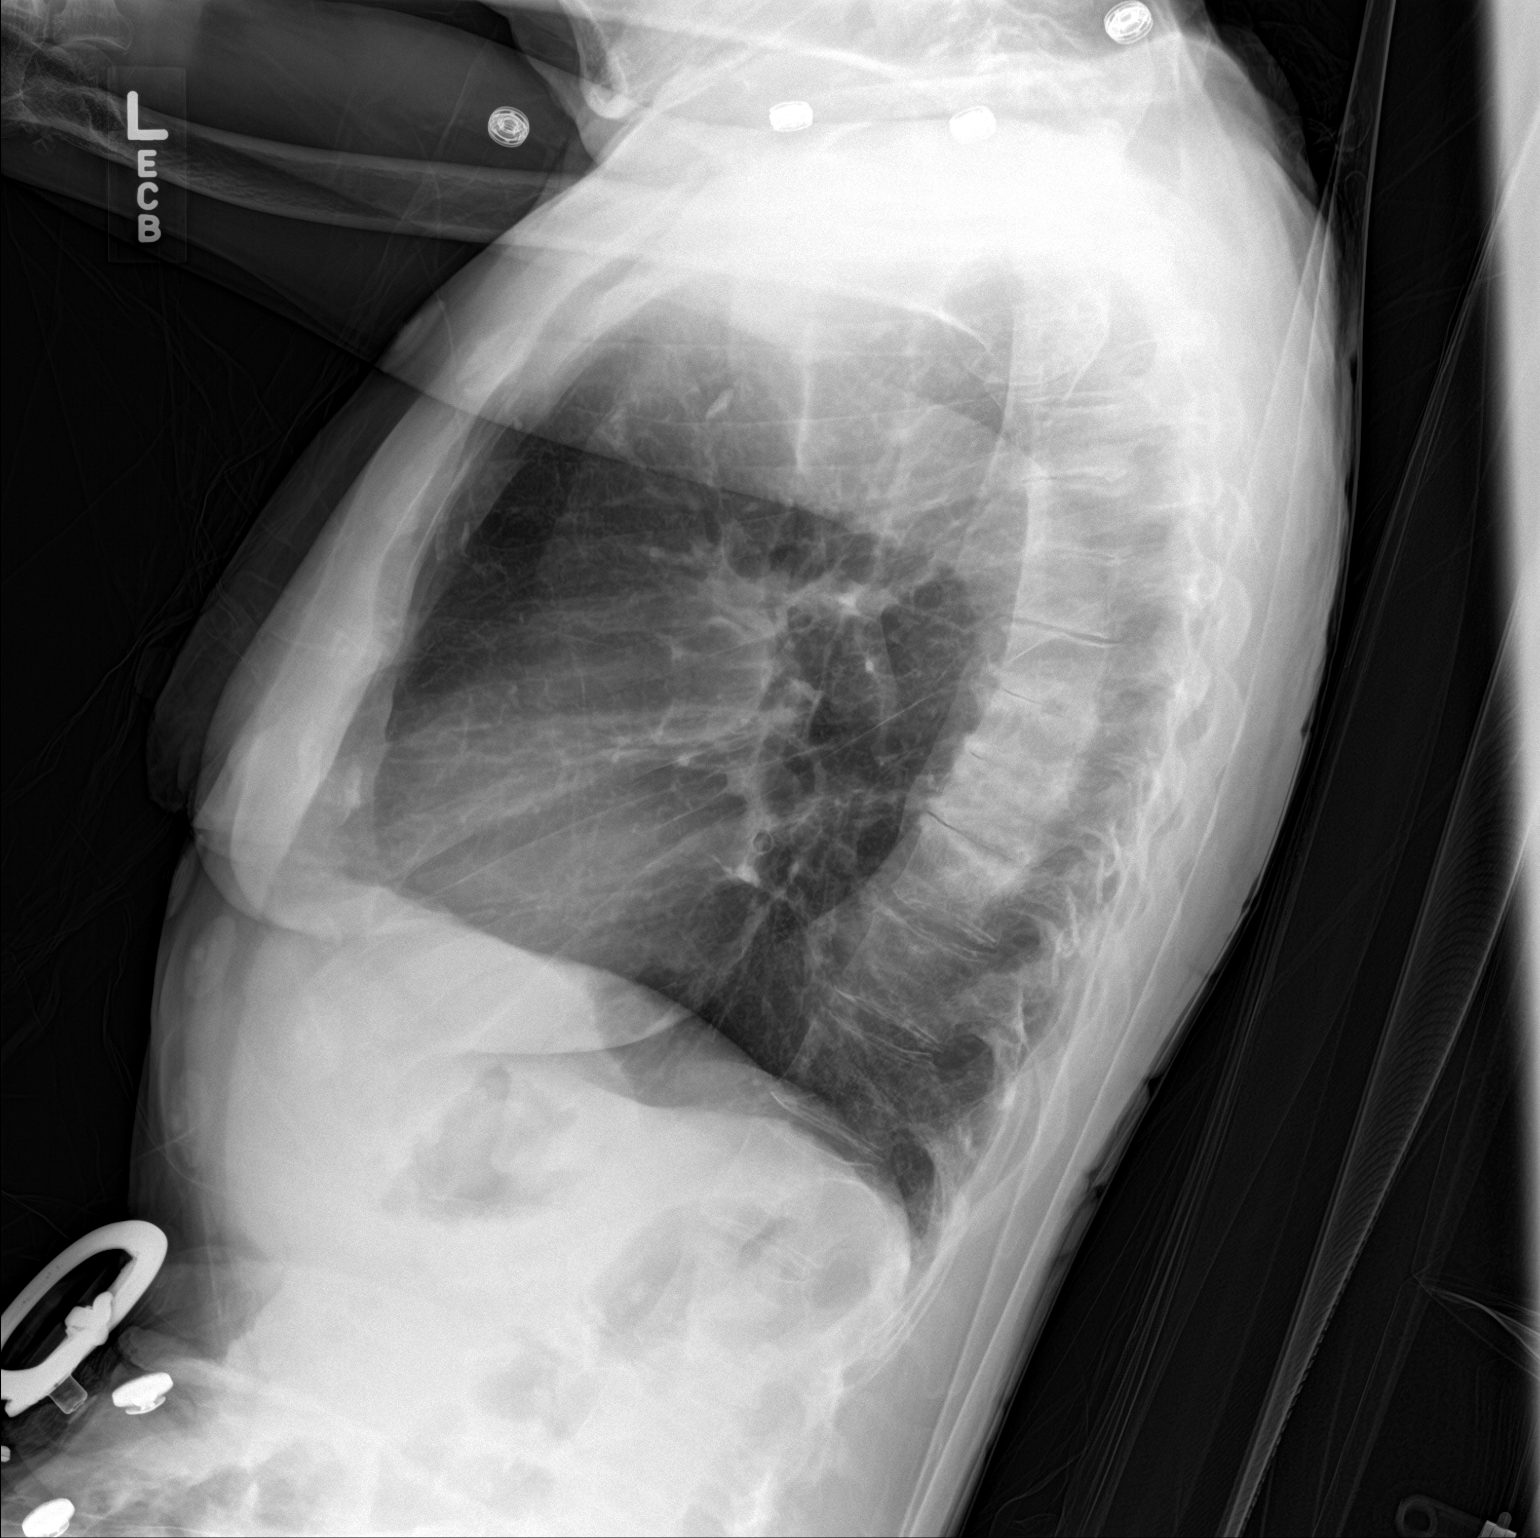

[chest ap]
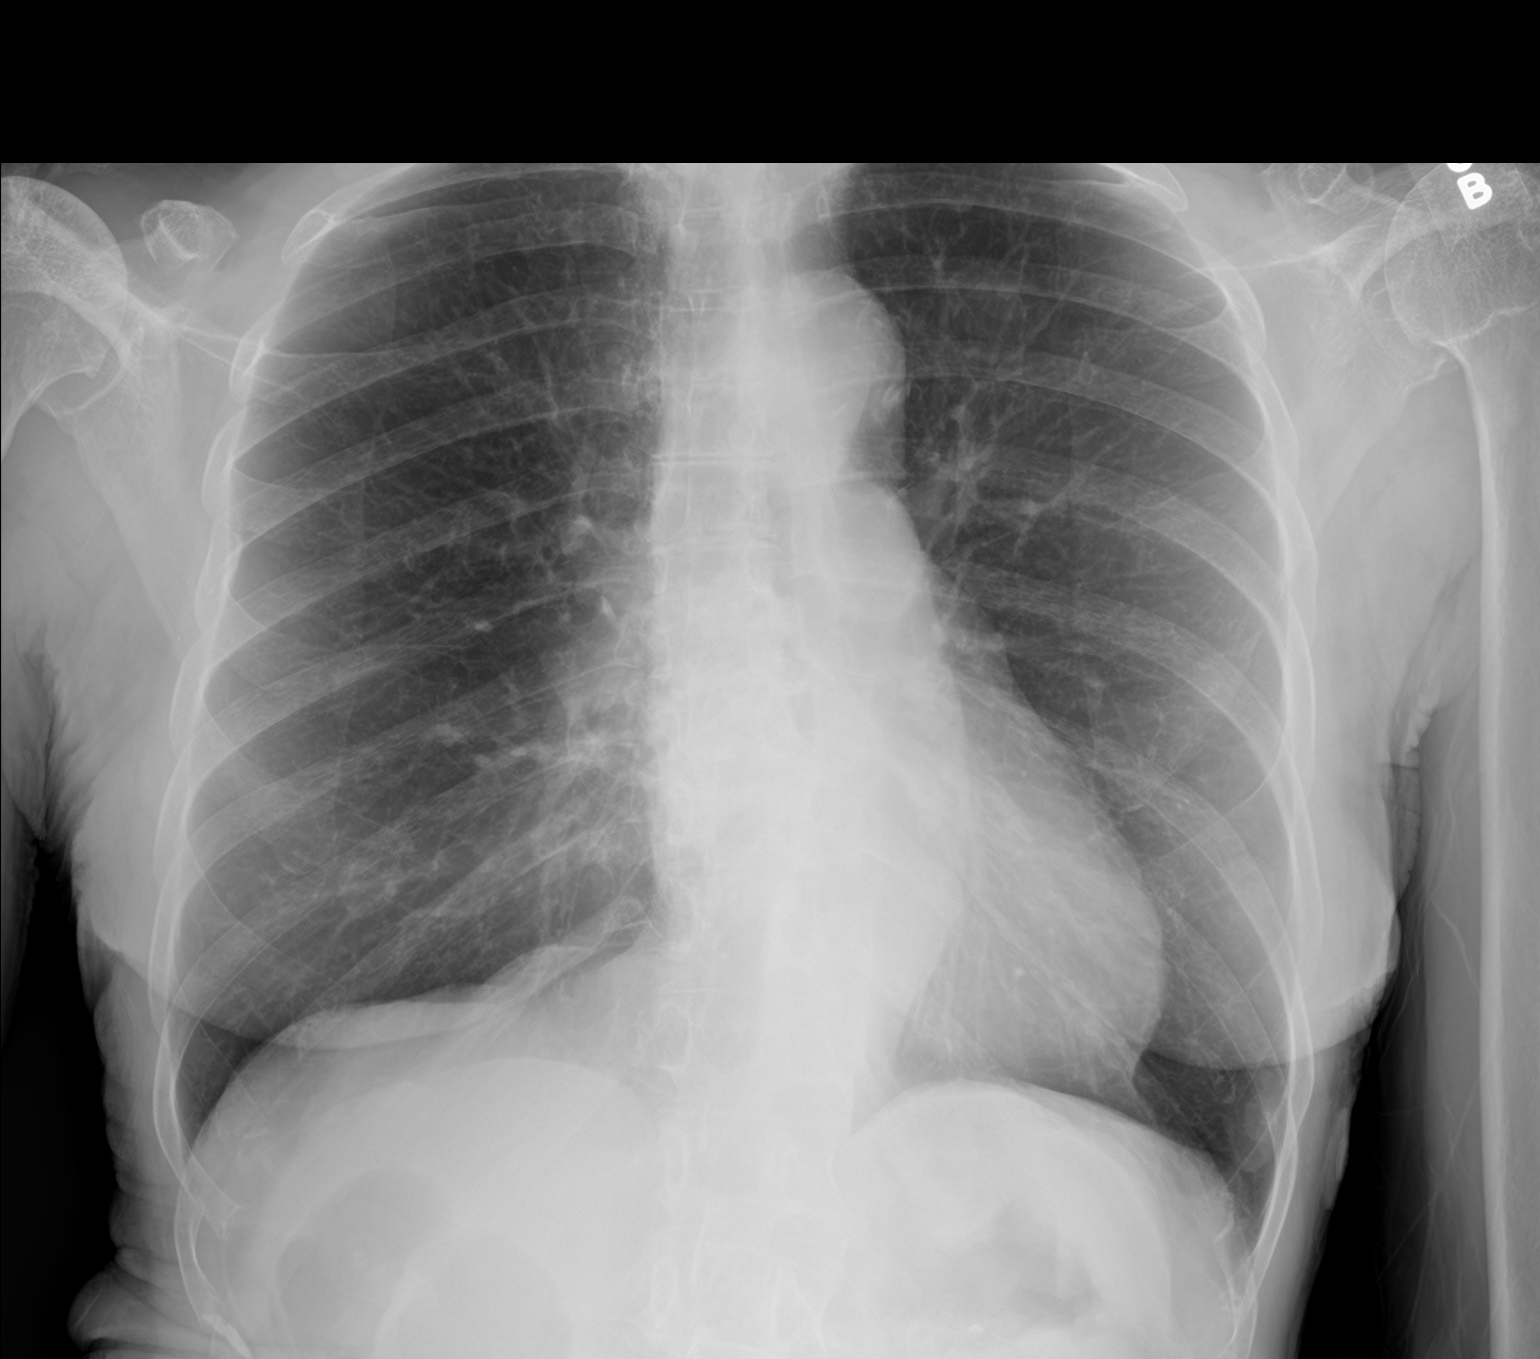

[2 of 2 positions shown; findings below may reference images not displayed]

FINDINGS: Emphysematous changes are noted throughout the lungs bilaterally.
Lung volumes appear slightly increased. No consolidative airspace
disease. No pleural effusions. No pneumothorax. Prominent bilateral
nipple shadows incidentally noted. No pulmonary nodule or mass
noted. Pulmonary vasculature and the cardiomediastinal silhouette
are within normal limits. Atherosclerosis in the thoracic aorta.
IMPRESSION: 1. No radiographic evidence of acute cardiopulmonary disease.
2. Aortic atherosclerosis.
3. Emphysema.

## 2017-06-02 IMAGING — CR DG KNEE COMPLETE 4+V*L*
4 series · 4 of 4 positions shown · non-contrast
Comparison: None.

CLINICAL DATA: Syncope and dyspnea for 1 day. Medial left knee
pain.

EXAM:
LEFT KNEE - COMPLETE 4+ VIEW

[knee ap]
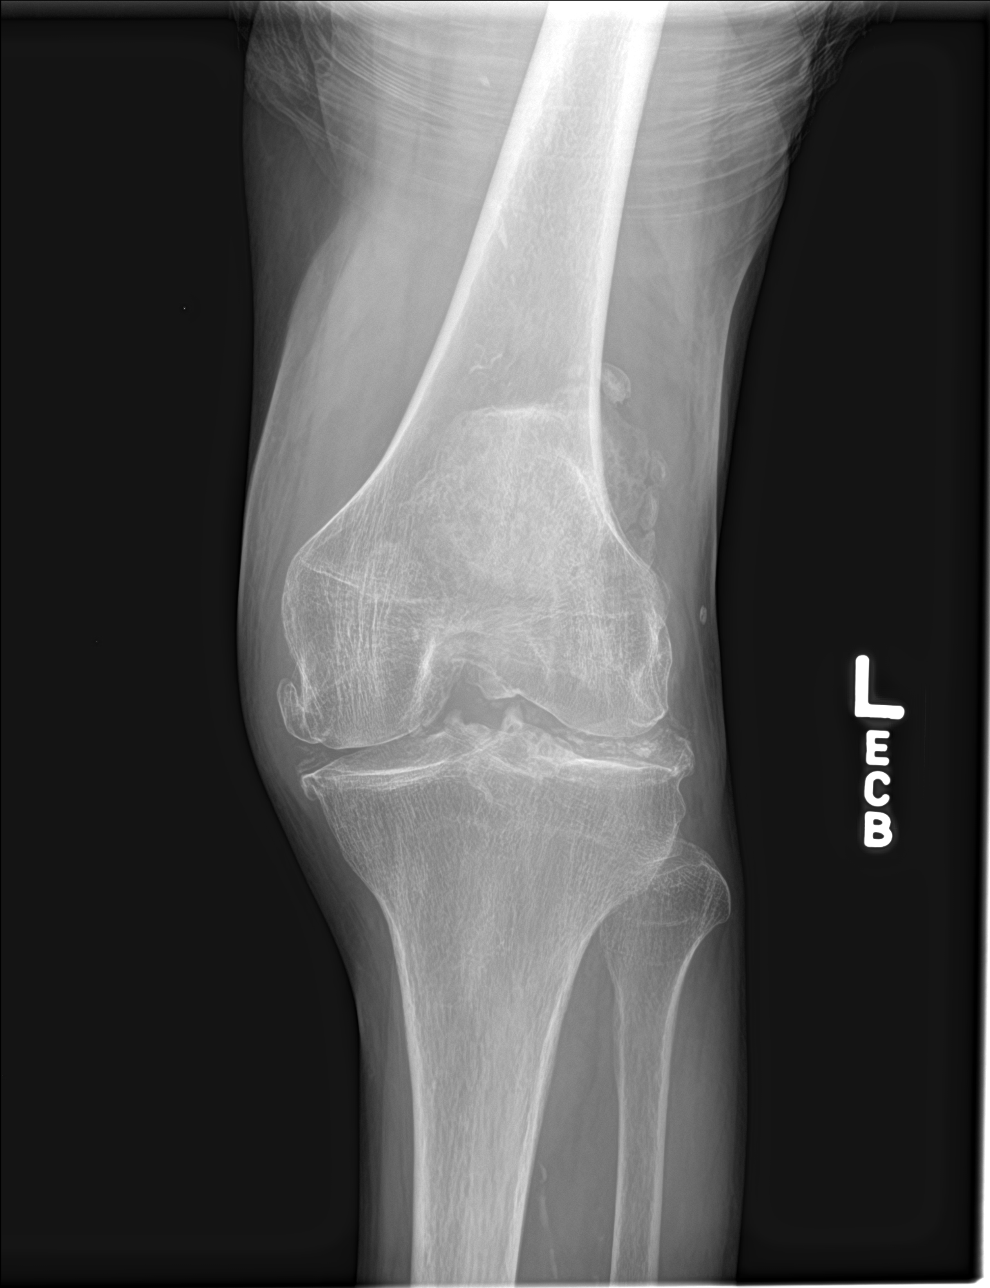

[knee lat]
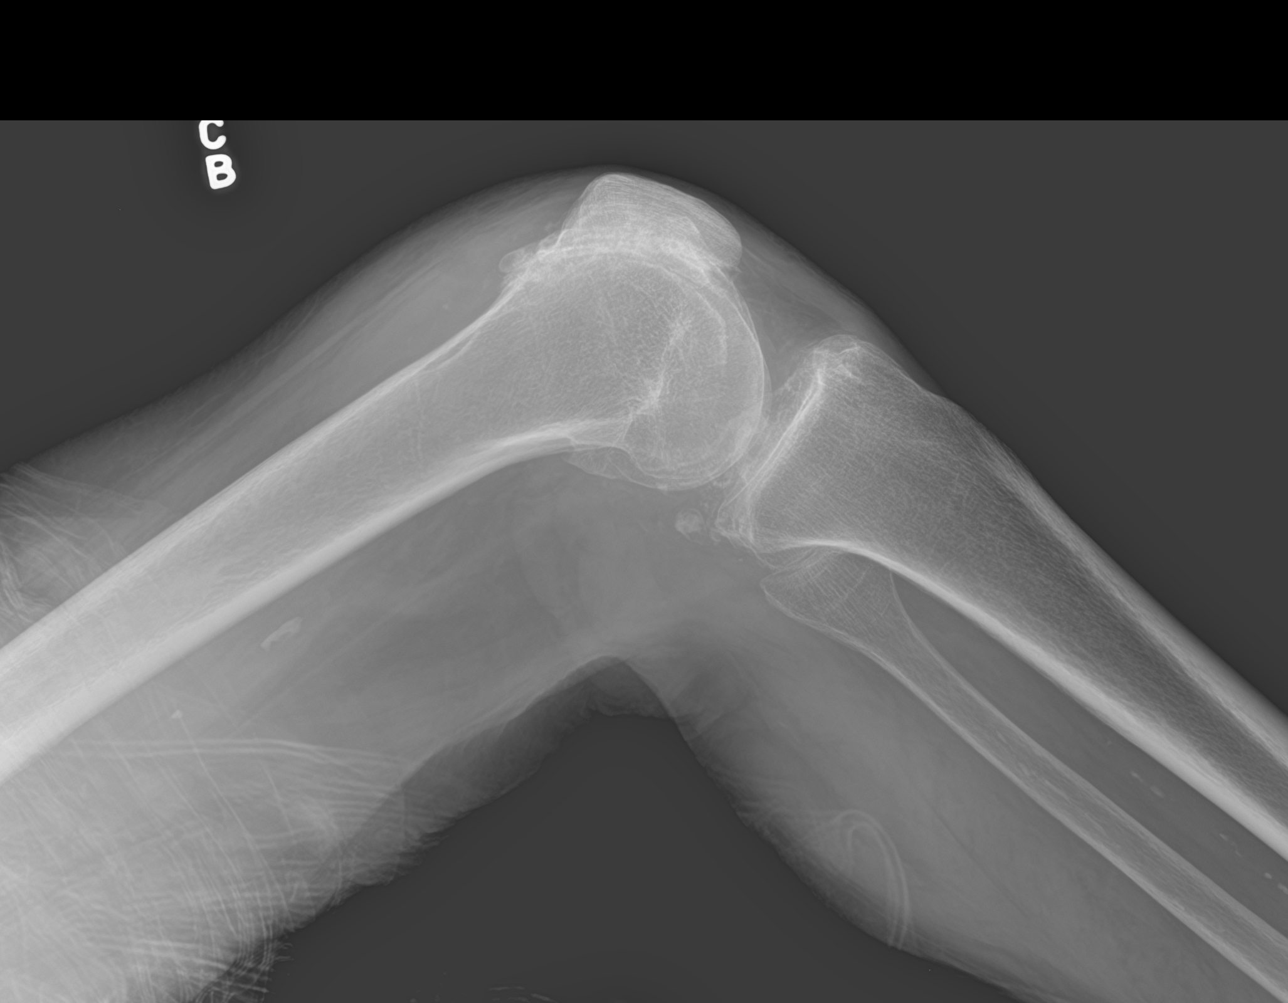

[knee obl (1 of 2)]
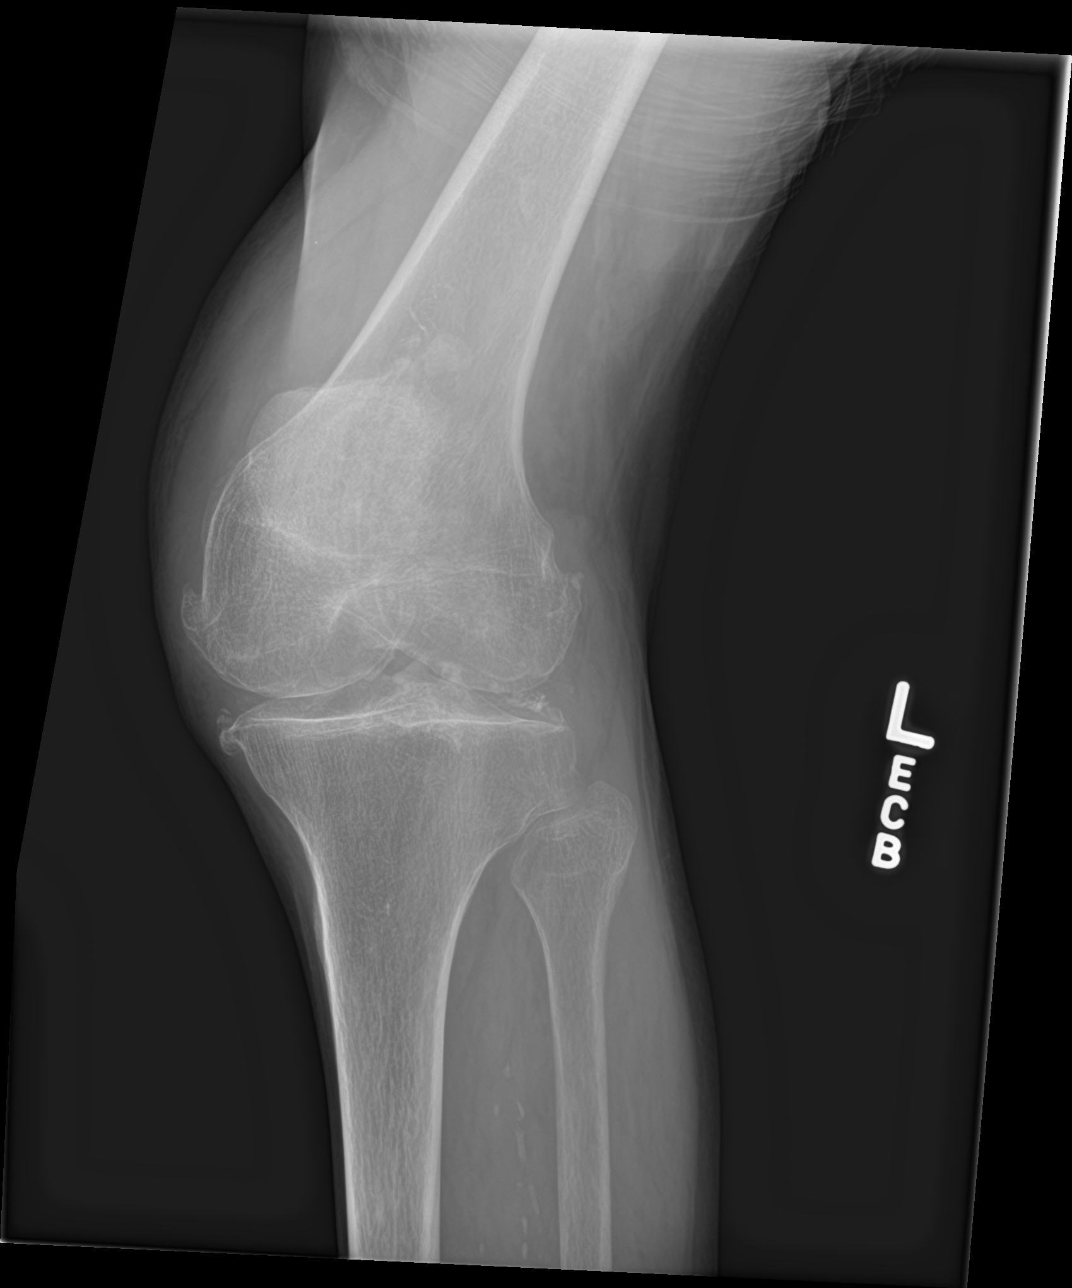

[knee obl (2 of 2)]
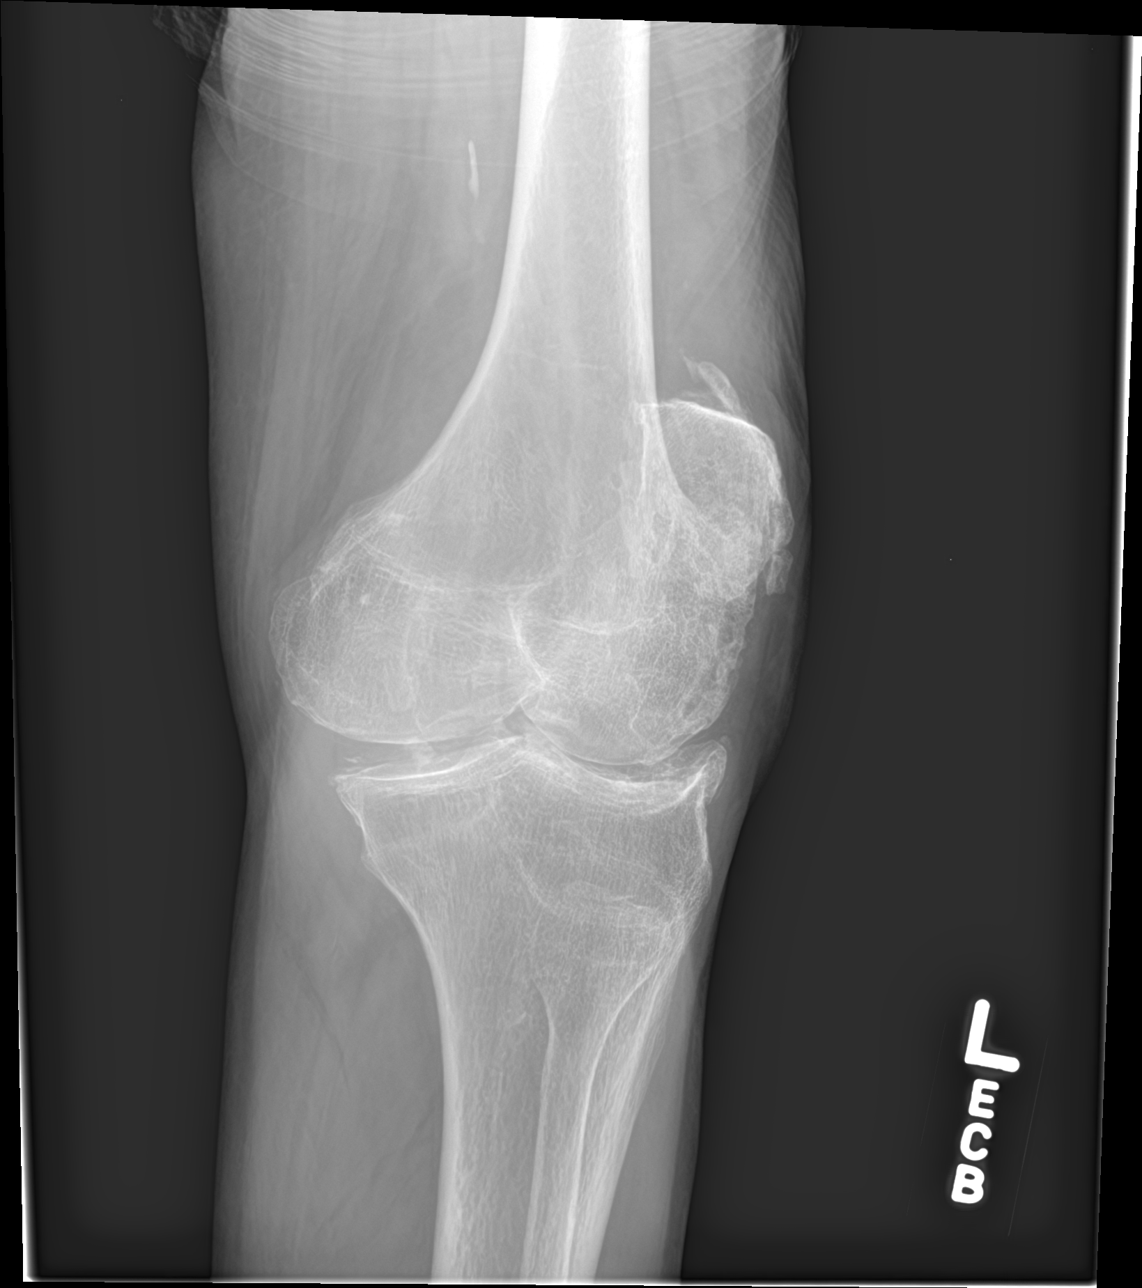

[4 of 4 positions shown; findings below may reference images not displayed]

FINDINGS: Chondrocalcinosis is identified in the lateral and medial
compartments. Tricompartmental degenerative changes are seen as well
in all 3 compartments. There is a small suprapatellar joint
effusion. No acute fractures are seen.
IMPRESSION: Chondrocalcinosis consistent with CPPD/pseudogout. Severe
tricompartmental degenerative changes. Small suprapatellar joint
effusion.

## 2017-06-02 IMAGING — NM NM PULMONARY VENT & PERF
16 series · 16 of 16 positions shown · non-contrast
Comparison: Chest radiograph performed earlier today at [DATE] p.m.

CLINICAL DATA: Acute onset of dyspnea and syncope. Shortness of
breath on exertion. Nausea. Initial encounter.

EXAM:
NUCLEAR MEDICINE VENTILATION - PERFUSION LUNG SCAN
TECHNIQUE: Ventilation images were obtained in multiple projections using
inhaled aerosol Hc-XXm DTPA. Perfusion images were obtained in
multiple projections after intravenous injection of Hc-XXm MAA.
RADIOPHARMACEUTICALS:  32.3 mCi Technetium-BBm DTPA aerosol
inhalation and 4.33 mCi Technetium-BBm MAA IV

[Series 1: ant/post vent · 4.14mm/px · 1 of 1 slices shown (1 of 2)]
[im 1/1]
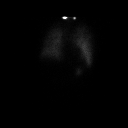

[Series 1: ant/post vent · 4.14mm/px · 1 of 1 slices shown (2 of 2)]
[im 1/1]
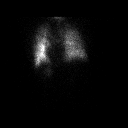

[Series 2: lao/rpo vent · 4.14mm/px · 1 of 1 slices shown (1 of 2)]
[im 1/1]
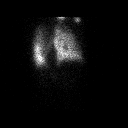

[Series 2: lao/rpo vent · 4.14mm/px · 1 of 1 slices shown (2 of 2)]
[im 1/1]
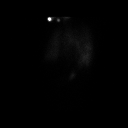

[Series 3: lpo/rao vent · 4.14mm/px · 1 of 1 slices shown (1 of 2)]
[im 1/1]
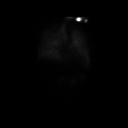

[Series 3: lpo/rao vent · 4.14mm/px · 1 of 1 slices shown (2 of 2)]
[im 1/1]
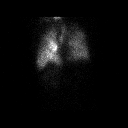

[Series 4: lt lat/rt lat vent · 4.14mm/px · 1 of 1 slices shown (1 of 2)]
[im 1/1]
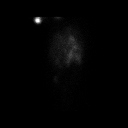

[Series 4: lt lat/rt lat vent · 4.14mm/px · 1 of 1 slices shown (2 of 2)]
[im 1/1]
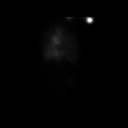

[Series 5: lt lat/rt lat perf · 4.14mm/px · 1 of 1 slices shown (1 of 2)]
[im 1/1]
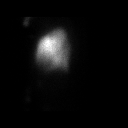

[Series 5: lt lat/rt lat perf · 4.14mm/px · 1 of 1 slices shown (2 of 2)]
[im 1/1]
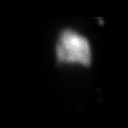

[Series 6: lpo/rao perf · 4.14mm/px · 1 of 1 slices shown (1 of 2)]
[im 1/1]
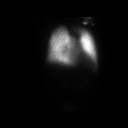

[Series 6: lpo/rao perf · 4.14mm/px · 1 of 1 slices shown (2 of 2)]
[im 1/1]
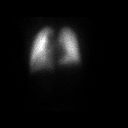

[Series 7: ant/post perf · 4.14mm/px · 1 of 1 slices shown (1 of 2)]
[im 1/1]
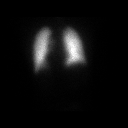

[Series 7: ant/post perf · 4.14mm/px · 1 of 1 slices shown (2 of 2)]
[im 1/1]
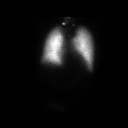

[Series 8: lao/rpo perf · 4.14mm/px · 1 of 1 slices shown (1 of 2)]
[im 1/1]
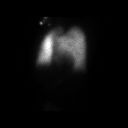

[Series 8: lao/rpo perf · 4.14mm/px · 1 of 1 slices shown (2 of 2)]
[im 1/1]
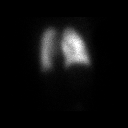

[16 of 16 positions shown; findings below may reference images not displayed]

FINDINGS: Ventilation: Mildly heterogeneous ventilation is noted within both
lungs, without significant focal ventilation defect.

Perfusion: No wedge shaped peripheral perfusion defects to suggest
acute pulmonary embolism.
IMPRESSION: Normal scan. No evidence for pulmonary embolus. Mildly heterogeneous
ventilation within both lungs.

## 2017-06-07 ENCOUNTER — Other Ambulatory Visit: Payer: Self-pay | Admitting: Cardiology

## 2017-06-07 DIAGNOSIS — R160 Hepatomegaly, not elsewhere classified: Secondary | ICD-10-CM
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
## 2017-06-14 ENCOUNTER — Encounter (HOSPITAL_COMMUNITY)
Admission: RE | Disposition: A | Discharge status: Home / Self Care | Payer: Self-pay | Source: Ambulatory Visit | Attending: Cardiology

## 2017-06-14 ENCOUNTER — Ambulatory Visit (HOSPITAL_COMMUNITY)
Admission: RE | Admit: 2017-06-14 | Discharge: 2017-06-14 | Disposition: A | Discharge status: Home / Self Care | Payer: Medicare HMO | Source: Ambulatory Visit | Attending: Cardiology | Admitting: Cardiology

## 2017-06-14 DIAGNOSIS — Z96641 Presence of right artificial hip joint: Secondary | ICD-10-CM | POA: Diagnosis not present

## 2017-06-14 DIAGNOSIS — I10 Essential (primary) hypertension: Secondary | ICD-10-CM | POA: Diagnosis present

## 2017-06-14 DIAGNOSIS — I150 Renovascular hypertension: Secondary | ICD-10-CM | POA: Diagnosis not present

## 2017-06-14 DIAGNOSIS — I959 Hypotension, unspecified: Secondary | ICD-10-CM | POA: Diagnosis present

## 2017-06-14 DIAGNOSIS — I739 Peripheral vascular disease, unspecified: Secondary | ICD-10-CM | POA: Insufficient documentation

## 2017-06-14 DIAGNOSIS — N183 Chronic kidney disease, stage 3 (moderate): Secondary | ICD-10-CM | POA: Insufficient documentation

## 2017-06-14 DIAGNOSIS — I701 Atherosclerosis of renal artery: Secondary | ICD-10-CM | POA: Insufficient documentation

## 2017-06-14 HISTORY — PX: LOWER EXTREMITY ANGIOGRAPHY: CATH118251

## 2017-06-14 HISTORY — PX: RENAL ANGIOGRAPHY: CATH118260

## 2017-06-14 SURGERY — RENAL ANGIOGRAPHY
Anesthesia: LOCAL

## 2017-06-14 MED ORDER — SODIUM CHLORIDE 0.9% FLUSH: 3.0000 mL | Freq: Two times a day (BID) | INTRAVENOUS | Status: DC

## 2017-06-14 MED ORDER — SODIUM CHLORIDE 0.9% FLUSH: 3.0000 mL | INTRAVENOUS | Status: DC | PRN

## 2017-06-14 MED ORDER — HEPARIN (PORCINE) IN NACL 2-0.9 UNIT/ML-% IJ SOLN
INTRAMUSCULAR | Status: AC
  Filled 2017-06-14: qty 500

## 2017-06-14 MED ORDER — HEPARIN (PORCINE) IN NACL 2-0.9 UNIT/ML-% IJ SOLN
INTRAMUSCULAR | Status: AC | PRN
  Administered 2017-06-14: 1000 mL

## 2017-06-14 MED ORDER — HEPARIN SODIUM (PORCINE) 1000 UNIT/ML IJ SOLN
INTRAMUSCULAR | Status: DC | PRN
  Administered 2017-06-14: 3000 [IU] via INTRAVENOUS

## 2017-06-14 MED ORDER — OXYCODONE-ACETAMINOPHEN 5-325 MG PO TABS
1.0000 | ORAL_TABLET | Freq: Once | ORAL | Status: AC
  Administered 2017-06-14: 1 via ORAL

## 2017-06-14 MED ORDER — LIDOCAINE HCL (PF) 1 % IJ SOLN
INTRAMUSCULAR | Status: DC | PRN
  Administered 2017-06-14: 7 mL
  Administered 2017-06-14: 2 mL

## 2017-06-14 MED ORDER — VERAPAMIL HCL 2.5 MG/ML IV SOLN
INTRA_ARTERIAL | Status: DC | PRN
  Administered 2017-06-14: 10 mL via INTRA_ARTERIAL

## 2017-06-14 MED ORDER — LABETALOL HCL 5 MG/ML IV SOLN
INTRAVENOUS | Status: AC
  Administered 2017-06-14: 10 mg via INTRAVENOUS
  Filled 2017-06-14: qty 4

## 2017-06-14 MED ORDER — MIDAZOLAM HCL 2 MG/2ML IJ SOLN
INTRAMUSCULAR | Status: DC | PRN
  Administered 2017-06-14: 1 mg via INTRAVENOUS

## 2017-06-14 MED ORDER — ACETAMINOPHEN 325 MG PO TABS: 650.0000 mg | ORAL_TABLET | ORAL | Status: DC | PRN

## 2017-06-14 MED ORDER — HEPARIN SODIUM (PORCINE) 1000 UNIT/ML IJ SOLN
INTRAMUSCULAR | Status: AC
  Filled 2017-06-14: qty 1

## 2017-06-14 MED ORDER — IODIXANOL 320 MG/ML IV SOLN
INTRAVENOUS | Status: DC | PRN
  Administered 2017-06-14: 10 mL via INTRA_ARTERIAL

## 2017-06-14 MED ORDER — HYDRALAZINE HCL 20 MG/ML IJ SOLN: 5.0000 mg | INTRAMUSCULAR | Status: DC | PRN

## 2017-06-14 MED ORDER — LABETALOL HCL 5 MG/ML IV SOLN
10.0000 mg | INTRAVENOUS | Status: DC | PRN
  Administered 2017-06-14 (×2): 10 mg via INTRAVENOUS

## 2017-06-14 MED ORDER — NITROGLYCERIN 1 MG/10 ML FOR IR/CATH LAB
INTRA_ARTERIAL | Status: AC
  Filled 2017-06-14: qty 10

## 2017-06-14 MED ORDER — OXYCODONE-ACETAMINOPHEN 5-325 MG PO TABS
ORAL_TABLET | ORAL | Status: AC
  Administered 2017-06-14: 1 via ORAL
  Filled 2017-06-14: qty 1

## 2017-06-14 MED ORDER — HYDRALAZINE HCL 20 MG/ML IJ SOLN
INTRAMUSCULAR | Status: AC
  Filled 2017-06-14: qty 1

## 2017-06-14 MED ORDER — MIDAZOLAM HCL 2 MG/2ML IJ SOLN
INTRAMUSCULAR | Status: AC
  Filled 2017-06-14: qty 2

## 2017-06-14 MED ORDER — LIDOCAINE HCL (PF) 1 % IJ SOLN
INTRAMUSCULAR | Status: AC
  Filled 2017-06-14: qty 30

## 2017-06-14 MED ORDER — FENTANYL CITRATE (PF) 100 MCG/2ML IJ SOLN
INTRAMUSCULAR | Status: DC | PRN
  Administered 2017-06-14: 50 ug via INTRAVENOUS

## 2017-06-14 MED ORDER — VERAPAMIL HCL 2.5 MG/ML IV SOLN
INTRAVENOUS | Status: AC
  Filled 2017-06-14: qty 2

## 2017-06-14 MED ORDER — SODIUM CHLORIDE 0.9 % IV SOLN
INTRAVENOUS | Status: DC
  Administered 2017-06-14: 500 mL via INTRAVENOUS

## 2017-06-14 MED ORDER — FENTANYL CITRATE (PF) 100 MCG/2ML IJ SOLN
INTRAMUSCULAR | Status: AC
Start: 2017-06-14 — End: ?
  Filled 2017-06-14: qty 2

## 2017-06-14 MED ORDER — SODIUM CHLORIDE 0.9 % IV SOLN: 250.0000 mL | INTRAVENOUS | Status: DC | PRN

## 2017-06-14 MED ORDER — HYDRALAZINE HCL 20 MG/ML IJ SOLN
INTRAMUSCULAR | Status: DC | PRN
  Administered 2017-06-14: 10 mg via INTRAVENOUS

## 2017-06-14 MED ORDER — ONDANSETRON HCL 4 MG/2ML IJ SOLN: 4.0000 mg | Freq: Four times a day (QID) | INTRAMUSCULAR | Status: DC | PRN

## 2017-06-14 SURGICAL SUPPLY — 21 items
CATH ANGIO 5F PIGTAIL 100CM (CATHETERS) ×1 IMPLANT
CATH EXPO 5F PIG 125CM (CATHETERS) ×1 IMPLANT
CATH INFINITI 5FR JR4 125CM (CATHETERS) ×1 IMPLANT
CATH SOS OMNI O 5F 80CM (CATHETERS) ×1 IMPLANT
COVER PRB 48X5XTLSCP FOLD TPE (BAG) IMPLANT
COVER PROBE 5X48 (BAG) ×3
DEVICE RAD TR BAND REGULAR (VASCULAR PRODUCTS) ×1 IMPLANT
FILTER CO2 0.2 MICRON (VASCULAR PRODUCTS) ×1 IMPLANT
GLIDESHEATH SLEND A-KIT 6F 20G (SHEATH) ×1 IMPLANT
GUIDEWIRE ANGLED .035X150CM (WIRE) ×1 IMPLANT
GUIDEWIRE ANGLED .035X260CM (WIRE) ×1 IMPLANT
GUIDEWIRE INQWIRE 1.5J.035X260 (WIRE) IMPLANT
INQWIRE 1.5J .035X260CM (WIRE) ×3
KIT MICROINTRODUCER STIFF 5F (SHEATH) ×1 IMPLANT
KIT PV (KITS) ×3 IMPLANT
RESERVOIR CO2 (VASCULAR PRODUCTS) ×1 IMPLANT
SET FLUSH CO2 (MISCELLANEOUS) ×1 IMPLANT
SHEATH PINNACLE 6F 10CM (SHEATH) ×1 IMPLANT
TRANSDUCER W/STOPCOCK (MISCELLANEOUS) ×3 IMPLANT
TRAY PV CATH (CUSTOM PROCEDURE TRAY) ×3 IMPLANT
WIRE HITORQ VERSACORE ST 145CM (WIRE) ×1 IMPLANT

## 2017-06-14 NOTE — Discharge Instructions (Signed)

## 2017-06-14 NOTE — Progress Notes (Signed)
Dr Einar Gip notified of b/p and ok to discharge home and no new orders

## 2017-06-15 ENCOUNTER — Encounter (HOSPITAL_COMMUNITY): Payer: Self-pay | Admitting: Cardiology

## 2017-06-17 ENCOUNTER — Ambulatory Visit
Admission: RE | Admit: 2017-06-17 | Discharge: 2017-06-17 | Disposition: A | Discharge status: Home / Self Care | Payer: Medicare HMO | Source: Ambulatory Visit | Attending: Cardiology | Admitting: Cardiology

## 2017-06-17 DIAGNOSIS — R160 Hepatomegaly, not elsewhere classified: Secondary | ICD-10-CM

## 2017-06-23 ENCOUNTER — Encounter (HOSPITAL_COMMUNITY): Payer: Self-pay | Admitting: *Deleted

## 2017-06-23 ENCOUNTER — Emergency Department (HOSPITAL_COMMUNITY): Payer: Medicare HMO

## 2017-06-23 ENCOUNTER — Observation Stay (HOSPITAL_COMMUNITY)
Admission: EM | Admit: 2017-06-23 | Discharge: 2017-06-24 | Disposition: A | Discharge status: Home / Self Care | Payer: Medicare HMO | Attending: Family Medicine | Admitting: Family Medicine

## 2017-06-23 DIAGNOSIS — G894 Chronic pain syndrome: Secondary | ICD-10-CM | POA: Insufficient documentation

## 2017-06-23 DIAGNOSIS — I10 Essential (primary) hypertension: Secondary | ICD-10-CM | POA: Diagnosis not present

## 2017-06-23 DIAGNOSIS — D649 Anemia, unspecified: Secondary | ICD-10-CM

## 2017-06-23 DIAGNOSIS — J320 Chronic maxillary sinusitis: Secondary | ICD-10-CM | POA: Diagnosis not present

## 2017-06-23 DIAGNOSIS — Z791 Long term (current) use of non-steroidal anti-inflammatories (NSAID): Secondary | ICD-10-CM | POA: Diagnosis not present

## 2017-06-23 DIAGNOSIS — I739 Peripheral vascular disease, unspecified: Secondary | ICD-10-CM | POA: Diagnosis not present

## 2017-06-23 DIAGNOSIS — G629 Polyneuropathy, unspecified: Secondary | ICD-10-CM | POA: Diagnosis not present

## 2017-06-23 DIAGNOSIS — F411 Generalized anxiety disorder: Secondary | ICD-10-CM | POA: Diagnosis not present

## 2017-06-23 DIAGNOSIS — F17210 Nicotine dependence, cigarettes, uncomplicated: Secondary | ICD-10-CM | POA: Insufficient documentation

## 2017-06-23 DIAGNOSIS — R9082 White matter disease, unspecified: Secondary | ICD-10-CM | POA: Diagnosis not present

## 2017-06-23 DIAGNOSIS — D638 Anemia in other chronic diseases classified elsewhere: Secondary | ICD-10-CM | POA: Insufficient documentation

## 2017-06-23 DIAGNOSIS — Z96641 Presence of right artificial hip joint: Secondary | ICD-10-CM | POA: Insufficient documentation

## 2017-06-23 DIAGNOSIS — G43909 Migraine, unspecified, not intractable, without status migrainosus: Secondary | ICD-10-CM | POA: Diagnosis not present

## 2017-06-23 DIAGNOSIS — I4581 Long QT syndrome: Secondary | ICD-10-CM | POA: Diagnosis not present

## 2017-06-23 DIAGNOSIS — I672 Cerebral atherosclerosis: Secondary | ICD-10-CM | POA: Diagnosis not present

## 2017-06-23 DIAGNOSIS — N179 Acute kidney failure, unspecified: Principal | ICD-10-CM | POA: Diagnosis present

## 2017-06-23 DIAGNOSIS — E785 Hyperlipidemia, unspecified: Secondary | ICD-10-CM | POA: Diagnosis not present

## 2017-06-23 DIAGNOSIS — F329 Major depressive disorder, single episode, unspecified: Secondary | ICD-10-CM | POA: Insufficient documentation

## 2017-06-23 DIAGNOSIS — Z79899 Other long term (current) drug therapy: Secondary | ICD-10-CM | POA: Insufficient documentation

## 2017-06-23 DIAGNOSIS — I701 Atherosclerosis of renal artery: Secondary | ICD-10-CM | POA: Diagnosis not present

## 2017-06-23 LAB — BASIC METABOLIC PANEL
Anion gap: 11 (ref 5–15)
BUN: 37 mg/dL — ABNORMAL HIGH (ref 6–20)
CO2: 17 mmol/L — ABNORMAL LOW (ref 22–32)
Calcium: 9 mg/dL (ref 8.9–10.3)
Chloride: 108 mmol/L (ref 101–111)
Creatinine, Ser: 2.38 mg/dL — ABNORMAL HIGH (ref 0.44–1.00)
GFR calc Af Amer: 22 mL/min — ABNORMAL LOW (ref >60)
GFR calc non Af Amer: 19 mL/min — ABNORMAL LOW (ref >60)
Glucose, Bld: 127 mg/dL — ABNORMAL HIGH (ref 65–99)
Potassium: 4.3 mmol/L (ref 3.5–5.1)
Sodium: 136 mmol/L (ref 135–145)

## 2017-06-23 LAB — CBC
HCT: 24.8 % — ABNORMAL LOW (ref 36.0–46.0)
Hemoglobin: 8.4 g/dL — ABNORMAL LOW (ref 12.0–15.0)
MCH: 27.9 pg (ref 26.0–34.0)
MCHC: 33.9 g/dL (ref 30.0–36.0)
MCV: 82.4 fL (ref 78.0–100.0)
Platelets: 283 10*3/uL (ref 150–400)
RBC: 3.01 MIL/uL — ABNORMAL LOW (ref 3.87–5.11)
RDW: 15.9 % — ABNORMAL HIGH (ref 11.5–15.5)
WBC: 7.9 10*3/uL (ref 4.0–10.5)

## 2017-06-23 LAB — TROPONIN I: Troponin I: <0.03 ng/mL (ref <0.03)

## 2017-06-23 MED ORDER — LACTATED RINGERS IV SOLN
INTRAVENOUS | Status: DC
  Administered 2017-06-23 – 2017-06-24 (×2): via INTRAVENOUS

## 2017-06-23 MED ORDER — ONDANSETRON HCL 4 MG/2ML IJ SOLN: 4.0000 mg | Freq: Four times a day (QID) | INTRAMUSCULAR | Status: DC | PRN

## 2017-06-23 MED ORDER — HYDRALAZINE HCL 20 MG/ML IJ SOLN
10.0000 mg | INTRAMUSCULAR | Status: DC | PRN
  Administered 2017-06-23 – 2017-06-24 (×2): 10 mg via INTRAVENOUS
  Filled 2017-06-23 (×2): qty 1

## 2017-06-23 MED ORDER — CITALOPRAM HYDROBROMIDE 20 MG PO TABS
40.0000 mg | ORAL_TABLET | Freq: Every day | ORAL | Status: DC
  Administered 2017-06-23 – 2017-06-24 (×2): 40 mg via ORAL
  Filled 2017-06-23: qty 2
  Filled 2017-06-23: qty 4

## 2017-06-23 MED ORDER — BUSPIRONE HCL 15 MG PO TABS
15.0000 mg | ORAL_TABLET | Freq: Two times a day (BID) | ORAL | Status: DC
  Administered 2017-06-23 – 2017-06-24 (×2): 15 mg via ORAL
  Filled 2017-06-23: qty 1
  Filled 2017-06-23: qty 2

## 2017-06-23 MED ORDER — AMLODIPINE BESYLATE 10 MG PO TABS
10.0000 mg | ORAL_TABLET | Freq: Every day | ORAL | Status: DC
  Administered 2017-06-24: 10 mg via ORAL
  Filled 2017-06-23: qty 1

## 2017-06-23 MED ORDER — ACETAMINOPHEN 500 MG PO TABS
1000.0000 mg | ORAL_TABLET | Freq: Once | ORAL | Status: AC
  Administered 2017-06-23: 1000 mg via ORAL
  Filled 2017-06-23: qty 2

## 2017-06-23 MED ORDER — ALPRAZOLAM 0.5 MG PO TABS
0.5000 mg | ORAL_TABLET | Freq: Every day | ORAL | Status: DC
  Administered 2017-06-23: 0.5 mg via ORAL
  Filled 2017-06-23: qty 2

## 2017-06-23 MED ORDER — ONDANSETRON HCL 4 MG PO TABS: 4.0000 mg | ORAL_TABLET | Freq: Four times a day (QID) | ORAL | Status: DC | PRN

## 2017-06-23 MED ORDER — LACTATED RINGERS IV BOLUS (SEPSIS)
1000.0000 mL | Freq: Once | INTRAVENOUS | Status: AC
  Administered 2017-06-23: 1000 mL via INTRAVENOUS

## 2017-06-23 MED ORDER — ROSUVASTATIN CALCIUM 10 MG PO TABS
10.0000 mg | ORAL_TABLET | Freq: Every day | ORAL | Status: DC
  Administered 2017-06-23 – 2017-06-24 (×2): 10 mg via ORAL
  Filled 2017-06-23 (×2): qty 1

## 2017-06-23 MED ORDER — GABAPENTIN 300 MG PO CAPS
300.0000 mg | ORAL_CAPSULE | Freq: Three times a day (TID) | ORAL | Status: DC
  Administered 2017-06-23 – 2017-06-24 (×2): 300 mg via ORAL
  Filled 2017-06-23 (×3): qty 1

## 2017-06-23 MED ORDER — LORAZEPAM 1 MG PO TABS
1.0000 mg | ORAL_TABLET | Freq: Once | ORAL | Status: AC
  Administered 2017-06-23: 1 mg via ORAL
  Filled 2017-06-23: qty 1

## 2017-06-23 MED ORDER — HEPARIN SODIUM (PORCINE) 5000 UNIT/ML IJ SOLN
5000.0000 [IU] | Freq: Three times a day (TID) | INTRAMUSCULAR | Status: DC
  Administered 2017-06-23 – 2017-06-24 (×3): 5000 [IU] via SUBCUTANEOUS
  Filled 2017-06-23 (×3): qty 1

## 2017-06-23 MED ORDER — ACETAMINOPHEN 325 MG PO TABS
650.0000 mg | ORAL_TABLET | Freq: Four times a day (QID) | ORAL | Status: DC | PRN
  Filled 2017-06-23: qty 2

## 2017-06-23 MED ORDER — LABETALOL HCL 100 MG PO TABS
300.0000 mg | ORAL_TABLET | Freq: Two times a day (BID) | ORAL | Status: DC
  Administered 2017-06-23 – 2017-06-24 (×2): 300 mg via ORAL
  Filled 2017-06-23: qty 2
  Filled 2017-06-23: qty 3

## 2017-06-23 MED ORDER — FERROUS SULFATE 325 (65 FE) MG PO TABS
325.0000 mg | ORAL_TABLET | ORAL | Status: DC
  Administered 2017-06-24: 325 mg via ORAL
  Filled 2017-06-23: qty 1

## 2017-06-23 MED ORDER — TRAZODONE HCL 50 MG PO TABS
50.0000 mg | ORAL_TABLET | Freq: Every evening | ORAL | Status: DC | PRN
Start: 2017-06-23 — End: 2017-06-24

## 2017-06-23 MED ORDER — CLONAZEPAM 1 MG PO TABS
1.0000 mg | ORAL_TABLET | Freq: Two times a day (BID) | ORAL | Status: DC
  Administered 2017-06-23 – 2017-06-24 (×2): 1 mg via ORAL
  Filled 2017-06-23: qty 1
  Filled 2017-06-23: qty 2

## 2017-06-23 MED ORDER — SENNOSIDES-DOCUSATE SODIUM 8.6-50 MG PO TABS
1.0000 | ORAL_TABLET | Freq: Every evening | ORAL | Status: DC | PRN
  Filled 2017-06-23: qty 1

## 2017-06-23 MED ORDER — HYDROCODONE-ACETAMINOPHEN 5-325 MG PO TABS
1.0000 | ORAL_TABLET | ORAL | Status: DC | PRN
  Administered 2017-06-23: 2 via ORAL
  Filled 2017-06-23: qty 2

## 2017-06-23 NOTE — ED Triage Notes (Addendum)
Pt sates that she was seen at her PCP yesterday for high bp pt reports a headache starting at 3am today. Pt was found to have HBP at greater than 366 systolic. Pt reports continued headache today with dizziness and blurred vision. Pt also reports having a panic attack. Pt tearful with increased respiration. No neuro deficits noted in triage.

## 2017-06-23 NOTE — H&P (Signed)
History and Physical    Alyssa [name] EXB:[id number] DOB: 1941-08-06  DOA: 06/23/2017 PCP: Everardo Beals, NP  Patient coming from: Home  Chief Complaint: Headache and malaise  HPI: Alyssa [name] is a 76 y.o. female with medical history significant of significant for anxiety, resistant hypertension, migraines and chronic pain presents to the emergency department complaining of headaches and malaise. Patient reported that headache started this morning she felt that this was her blood pressure. She took her blood pressure medications but she did not have any improvement and progressively became malaise. She reported that he has been workup for high blood pressure as he has been very difficult to treat for the past 3-6 months. She visited her cardiologist who did some adjustment to her medications and found that she has renal artery stenosis. Patient reported that she went on a renal angiogram but no records available. Patient at the time of my evaluation was feeling much better after receiving IV fluids and denies chest pain, shortness of breath, palpitations, dizziness and weakness.   ED Course: She was found to be slightly tremulous, creatinine of 2.38 up from 1.0. Blood pressure 150/87. Hemoglobin of 8.4. otherwise unremarkable   Review of Systems:   General: no changes in body weight, no fever chills or decrease in energy.  HEENT: no blurry vision, hearing changes or sore throat Respiratory: no dyspnea, coughing, wheezing CV: no chest pain, no palpitations GI: no nausea, vomiting, abdominal pain, constipation + diarrhea  GU: no dysuria, burning on urination, increased urinary frequency, hematuria  Ext:. No deformities,  Neuro: no unilateral weakness, numbness, or tingling, no vision change or hearing loss Skin: No rashes, lesions or wounds. MSK: No muscle spasm, no deformity, no limitation of range of movement in spin Heme: No easy bruising.  Travel history: No recent  long distant travel.   Past Medical History:  Diagnosis Date  . Anxiety   . Arthritis   . Headache(784.0)   . Hyperlipidemia   . Hypertension   . Joint pain   . Leg pain   . Peripheral neuropathy   . Peripheral vascular disease [facility])     Past Surgical History:  Procedure Laterality Date  . ABDOMINAL ANGIOGRAM N/A 10/29/2011   Procedure: ABDOMINAL ANGIOGRAM;  Surgeon: Elam Dutch, MD;  Location: [facility] CATH LAB;  Service: Cardiovascular;  Laterality: N/A;  . BACK SURGERY    . FEMORAL-FEMORAL BYPASS GRAFT  08/31/2010  . JOINT REPLACEMENT Right 2012   Hip  . LOWER EXTREMITY ANGIOGRAPHY  06/14/2017   Procedure: Lower Extremity Angiography;  Surgeon: Adrian Prows, MD;  Location: [facility] CV LAB;  Service: Cardiovascular;;  Bilateral limited angio performed  . RENAL ANGIOGRAPHY N/A 06/14/2017   Procedure: Renal Angiography;  Surgeon: Adrian Prows, MD;  Location: [facility] CV LAB;  Service: Cardiovascular;  Laterality: N/A;  . TOTAL HIP ARTHROPLASTY     right     reports that she has been smoking Cigarettes.  She has been smoking about 0.50 packs per day. She has never used smokeless tobacco. She reports that she does not drink alcohol or use drugs.  No Known Allergies  Family History  Problem Relation Age of Onset  . Heart attack Mother   . Heart disease Mother   . Hyperlipidemia Mother   . Hypertension Mother   . Heart attack Father   . Heart disease Father        Before age 63  . Hyperlipidemia Father   . Hypertension Father   .  Cancer Sister   . Aneurysm Brother   . Cancer Brother     Prior to Admission medications   Medication Sig Start Date End Date Taking? Authorizing Provider  acetaminophen (TYLENOL) 325 MG tablet Take 2 tablets (650 mg total) by mouth every 6 (six) hours as needed for mild pain (or Fever >/= 101). 04/26/16  Yes Verlee Monte, MD  ALPRAZolam Duanne Moron) 0.5 MG tablet Take 0.5 mg by mouth at bedtime.   Yes [provider]  amLODipine  (NORVASC) 10 MG tablet Take 1 tablet (10 mg total) by mouth daily. 12/29/16  Yes Robbie Lis, MD  citalopram (CELEXA) 40 MG tablet Take 1 tablet (40 mg total) by mouth daily. 08/30/15  Yes Delfin Gant, NP  clonazePAM (KLONOPIN) 1 MG tablet Take 1 tablet (1 mg total) by mouth 2 (two) times daily. Patient taking differently: Take 2 mg by mouth 2 (two) times daily.  12/28/16  Yes Robbie Lis, MD  cloNIDine (CATAPRES) 0.1 MG tablet Take 0.1 mg by mouth daily.   Yes [provider]  ergocalciferol (VITAMIN D2) 50000 units capsule Take 50,000 Units by mouth once a week.   Yes [provider]  ferrous sulfate 325 (65 FE) MG tablet Take 1 tablet (325 mg total) by mouth every Monday, Wednesday, and Friday. With breakfast 09/20/16  Yes Florencia Reasons, MD  gabapentin (NEURONTIN) 300 MG capsule Take 1 capsule by mouth 3 (three) times daily. 05/16/17  Yes [provider]  labetalol (NORMODYNE) 300 MG tablet Take 1 tablet by mouth 2 (two) times daily. 06/05/17  Yes [provider]  lithium 300 MG tablet Take 300 mg by mouth daily.   Yes [provider]  Multiple Vitamin (MULTIVITAMIN WITH MINERALS) TABS tablet Take 1 tablet by mouth daily. One a Day Women's   Yes [provider]  rosuvastatin (CRESTOR) 10 MG tablet Take 10 mg by mouth daily.    Yes [provider]  traZODone (DESYREL) 50 MG tablet Take 1 tablet (50 mg total) by mouth at bedtime as needed for sleep. 08/30/15  Yes Onuoha, Josephine C, NP  valsartan (DIOVAN) 320 MG tablet Take 320 mg by mouth daily.   Yes [provider]    Physical Exam: Vitals:   06/23/17 1630 06/23/17 1700 06/23/17 1730 06/23/17 1800  BP:  (!) 150/84 (!) 158/75   Pulse: 72 74 73 76  Resp: 16 18 14  (!) 21  Temp:      TempSrc:      SpO2: 100% 97% 96% 100%  Weight:      Height:        Constitutional: NAD, calm, comfortable Eyes: PERRL, lids and conjunctivae normal ENMT: Mucous membranes are  moist. Posterior pharynx clear of any exudate or lesions. Neck: normal, supple, no masses, no thyromegaly Respiratory: clear to auscultation bilaterally, no wheezing, no crackles. Normal respiratory effort. Cardiovascular: Regular rate and rhythm, no murmurs / rubs / gallops. No extremity edema. 2+ pedal pulses.  Abdomen: no tenderness, no masses palpated. No hepatosplenomegaly. Bowel sounds positive.  Musculoskeletal: No tremors. No joint deformity upper and lower extremities. Good ROM,  Skin: no rashes, lesions, ulcers. No induration Neurologic: CN 2-12 grossly intact. Sensation intact  Psychiatric: Normal judgment and insight. Alert and oriented x 3. Normal mood.   Labs on Admission: I have personally reviewed following labs and imaging studies  CBC:  Recent Labs Lab 06/23/17 1225  WBC 7.9  HGB 8.4*  HCT 24.8*  MCV 82.4  PLT  263   Basic Metabolic Panel:  Recent Labs Lab 06/23/17 1225  NA 136  K 4.3  CL 108  CO2 17*  GLUCOSE 127*  BUN 37*  CREATININE 2.38*  CALCIUM 9.0   GFR: Estimated Creatinine Clearance: 15.2 mL/min (A) (by C-G formula based on SCr of 2.38 mg/dL (H)). Liver Function Tests: No results for input(s): AST, ALT, ALKPHOS, BILITOT, PROT, ALBUMIN in the last 168 hours. No results for input(s): LIPASE, AMYLASE in the last 168 hours. No results for input(s): AMMONIA in the last 168 hours. Coagulation Profile: No results for input(s): INR, PROTIME in the last 168 hours. Cardiac Enzymes:  Recent Labs Lab 06/23/17 1557  TROPONINI <0.03   BNP (last 3 results) No results for input(s): PROBNP in the last 8760 hours. HbA1C: No results for input(s): HGBA1C in the last 72 hours. CBG: No results for input(s): GLUCAP in the last 168 hours. Lipid Profile: No results for input(s): CHOL, HDL, LDLCALC, TRIG, CHOLHDL, LDLDIRECT in the last 72 hours. Thyroid Function Tests: No results for input(s): TSH, T4TOTAL, FREET4, T3FREE, THYROIDAB in the last 72  hours. Anemia Panel: No results for input(s): VITAMINB12, FOLATE, FERRITIN, TIBC, IRON, RETICCTPCT in the last 72 hours. Urine analysis:    Component Value Date/Time   COLORURINE YELLOW 09/17/2016 1917   APPEARANCEUR CLEAR 09/17/2016 1917   LABSPEC 1.013 09/17/2016 1917   PHURINE 6.0 09/17/2016 1917   GLUCOSEU NEGATIVE 09/17/2016 1917   HGBUR NEGATIVE 09/17/2016 1917   BILIRUBINUR NEGATIVE 09/17/2016 1917   KETONESUR NEGATIVE 09/17/2016 1917   PROTEINUR 100 (A) 09/17/2016 1917   UROBILINOGEN 0.2 08/24/2015 1737   NITRITE NEGATIVE 09/17/2016 [place] 09/17/2016 1917   Sepsis Labs: !!!!!!!!!!!!!!!!!!!!!!!!!!!!!!!!!!!!!!!!!!!! @LABRCNTIP (procalcitonin:4,lacticidven:4) )No results found for this or any previous visit (from the past 240 hour(s)).   Radiological Exams on Admission: Ct Head Wo Contrast  Result Date: 06/23/2017 CLINICAL DATA:  Acute headache starting at 3 a.m. Hypertension. Dizziness and blurred vision. EXAM: CT HEAD WITHOUT CONTRAST TECHNIQUE: Contiguous axial images were obtained from the base of the skull through the vertex without intravenous contrast. COMPARISON:  12/27/2016 FINDINGS: Brain: The brainstem, cerebellum, cerebral peduncles, thalami, basal ganglia, basilar cisterns, and ventricular system appear within normal limits. Periventricular white matter and corona radiata hypodensities favor chronic ischemic microvascular white matter disease. No intracranial hemorrhage, mass lesion, or acute CVA. Vascular: There is atherosclerotic calcification of the cavernous carotid arteries bilaterally. Skull: Unremarkable Sinuses/Orbits: Chronic bilateral maxillary sinusitis. Prior right ethmoidectomy and maxillary antrostomy. Small bilateral mastoid effusions. Other: Incidental failure fusion of the posterior arch of C1. IMPRESSION: 1. No acute intracranial findings to explain the patient's current headache. 2. Periventricular white matter and corona radiata  hypodensities favor chronic ischemic microvascular white matter disease. 3. Chronic bilateral maxillary sinusitis. Small bilateral mastoid effusions. 4. Atherosclerosis. Electronically Signed   By: Van Clines M.D.   On: 06/23/2017 13:54    EKG: Independently reviewed. Normal sinus rhythm, with LVH.   Assessment/Plan Acute renal failure in setting of poorly controlled hypertension, use of NSAIDs and renal artery stenosis. Cr in March 1.04 which has been gradually worsen, no data available. Cr upon admission 2.38. Also patient report that she went under Renal angiogram but no records available  Admit to observation for IVF hydration, Cr likely becoming new baseline in view of stenosis, per patient she was told that one of her kidney is not working.  Monitor renal function  Get urine lytes Check UA to rule out infection Patient prob can  be d/c in AM if cr stable, need nephrology follow up as outpatient   Resistant HTN  Patient has been seen by cardiology  She is on Labetalol and Norvasc  BP stable st this time  Continue to monitor   Anemia  Unknown etiology  Baseline around 10 Hemoglobin on admission 8.4 FOBT, anemia panel No overt bleeding Monitor CBC in the morning  Major depression disorder Continue home medications  DVT prophylaxis: Heparin subcutaneous Code Status: Full code Family Communication: None at bedside Disposition Plan: Anticipate discharge to previous home environment.  Consults called: None Admission status: MedSurg/Obs   Chipper Oman MD [facility] Pager: Text Page via [email or web address]  [phone]  If 7PM-7AM, please contact night-coverage [email or web address] Password [facility]  06/23/2017, 6:36 PM

## 2017-06-23 NOTE — ED Notes (Signed)
Ordered heart diet for patient and provided with water and graham crackers.

## 2017-06-23 NOTE — ED Provider Notes (Signed)
I have personally seen and examined the patient. I have reviewed the documentation on PMH/FH/Soc Hx. I have discussed the plan of care with the resident and patient.  I have reviewed and agree with the resident's documentation. Please see associated encounter note.   EKG Interpretation  Date/Time:  Thursday June 23 2017 12:28:24 EDT Ventricular Rate:  70 PR Interval:  178 QRS Duration: 72 QT Interval:  478 QTC Calculation: 516 R Axis:   86 Text Interpretation:  Normal sinus rhythm Left ventricular hypertrophy with repolarization abnormality Prolonged QT Abnormal ECG No significant change since last tracing Confirmed by Addison Lank [phone]) on 06/23/2017 4:37:04 PM         Aydrien Froman, Grayce Sessions, MD 06/23/17 [phone]

## 2017-06-23 NOTE — ED Provider Notes (Signed)
[facility] DEPT Provider Note   CSN: [id number] Arrival date & time: 06/23/17  1212     History   Chief Complaint Chief Complaint  Patient presents with  . Headache    HPI Alyssa [name] is a 76 y.o. female.  This is a 76 year old female with PMH of poorly controlled hypertension, HLD, chronic pain, peripheral neuropathy, migraine headaches, prior benzodiazepine withdrawal who presents with headache and blurry vision which began suddenly this morning at 3 AM.  Patient was recently seen by PCP and prescribed new blood pressure medication in addition to her amlodipine.  Patient does not recall the name. Patient denies any dyspnea, chest pain, fevers.  She denies any focal sensation changes excluding her peripheral neuropathy which affects her lower extremities and has not changed.  She denies any focal motor weakness.  Denies any falls, nausea, vomiting. Patient states she has not taken her Klonopin within the past 48 hours. Patient currently states since arrival her vision has improved back to baseline and she complains of no visual disturbances currently.  She states her headache is still present.  She feels it is different from her migraine, it is nonfocal and not unilateral.    The history is provided by the patient and medical records.    Past Medical History:  Diagnosis Date  . Anxiety   . Arthritis   . Headache(784.0)   . Hyperlipidemia   . Hypertension   . Joint pain   . Leg pain   . Peripheral neuropathy   . Peripheral vascular disease [facility])     Patient Active Problem List   Diagnosis Date Noted  . AKI (acute kidney injury) ([place]) 06/23/2017  . Hypertensive urgency 12/28/2016  . Elevated serum creatinine 12/28/2016  . Normocytic anemia 12/28/2016  . Chronic pain syndrome 12/28/2016  . Benzodiazepine withdrawal ([place]) 12/28/2016  . Benzodiazepine withdrawal without complication ([place]) 02/72/5366  . Chronic right shoulder pain 12/06/2016  . Fall  09/17/2016  . Fracture of humeral head, closed, right, initial encounter 09/17/2016  . Rib fractures 09/17/2016  . Multiple falls 09/17/2016  . Severe muscle deconditioning 09/17/2016  . Failure to thrive in adult 09/17/2016  . Melena 04/25/2016  . Acute kidney injury ([place]) 04/25/2016  . Resistant hypertension 04/25/2016  . Anxiety state 04/25/2016  . Tobacco abuse 04/25/2016  . Hyperlipidemia 04/25/2016  . Syncope 04/24/2016  . Syncope and collapse 04/24/2016  . Gait difficulty 01/12/2016  . Foot drop, left 01/12/2016  . Severe recurrent major depression without psychotic features ([place]) 08/28/2015  . Spondylolisthesis at L4-L5 level 07/30/2015  . PVD (peripheral vascular disease) with claudication ([place]) 07/29/2014  . Weakness-Bilateral arm/leg 07/29/2014  . Numbness-left leg 07/29/2014  . Swelling of limb-Legs 07/29/2014  . Atherosclerosis of native arteries of the extremities with intermittent claudication 09/30/2011    Past Surgical History:  Procedure Laterality Date  . ABDOMINAL ANGIOGRAM N/A 10/29/2011   Procedure: ABDOMINAL ANGIOGRAM;  Surgeon: Elam Dutch, MD;  Location: [facility] CATH LAB;  Service: Cardiovascular;  Laterality: N/A;  . BACK SURGERY    . FEMORAL-FEMORAL BYPASS GRAFT  08/31/2010  . JOINT REPLACEMENT Right 2012   Hip  . LOWER EXTREMITY ANGIOGRAPHY  06/14/2017   Procedure: Lower Extremity Angiography;  Surgeon: Adrian Prows, MD;  Location: [facility] CV LAB;  Service: Cardiovascular;;  Bilateral limited angio performed  . RENAL ANGIOGRAPHY N/A 06/14/2017   Procedure: Renal Angiography;  Surgeon: Adrian Prows, MD;  Location: [facility] CV LAB;  Service: Cardiovascular;  Laterality: N/A;  .  TOTAL HIP ARTHROPLASTY     right    OB History    No data available       Home Medications    Prior to Admission medications   Medication Sig Start Date End Date Taking? Authorizing Provider  acetaminophen (TYLENOL) 325 MG tablet Take 2 tablets (650 mg total) by  mouth every 6 (six) hours as needed for mild pain (or Fever >/= 101). 04/26/16  Yes Verlee Monte, MD  ALPRAZolam Duanne Moron) 0.5 MG tablet Take 0.5 mg by mouth at bedtime.   Yes [provider]  amLODipine (NORVASC) 10 MG tablet Take 1 tablet (10 mg total) by mouth daily. 12/29/16  Yes Robbie Lis, MD  citalopram (CELEXA) 40 MG tablet Take 1 tablet (40 mg total) by mouth daily. 08/30/15  Yes Delfin Gant, NP  clonazePAM (KLONOPIN) 1 MG tablet Take 1 tablet (1 mg total) by mouth 2 (two) times daily. Patient taking differently: Take 2 mg by mouth 2 (two) times daily.  12/28/16  Yes Robbie Lis, MD  cloNIDine (CATAPRES) 0.1 MG tablet Take 0.1 mg by mouth daily.   Yes [provider]  ergocalciferol (VITAMIN D2) 50000 units capsule Take 50,000 Units by mouth once a week.   Yes [provider]  ferrous sulfate 325 (65 FE) MG tablet Take 1 tablet (325 mg total) by mouth every Monday, Wednesday, and Friday. With breakfast 09/20/16  Yes Florencia Reasons, MD  gabapentin (NEURONTIN) 300 MG capsule Take 1 capsule by mouth 3 (three) times daily. 05/16/17  Yes [provider]  labetalol (NORMODYNE) 300 MG tablet Take 1 tablet by mouth 2 (two) times daily. 06/05/17  Yes [provider]  lithium 300 MG tablet Take 300 mg by mouth daily.   Yes [provider]  Multiple Vitamin (MULTIVITAMIN WITH MINERALS) TABS tablet Take 1 tablet by mouth daily. One a Day Women's   Yes [provider]  rosuvastatin (CRESTOR) 10 MG tablet Take 10 mg by mouth daily.    Yes [provider]  temazepam (RESTORIL) 30 MG capsule Take 30 mg by mouth at bedtime.   Yes [provider]  valsartan (DIOVAN) 320 MG tablet Take 320 mg by mouth daily.   Yes [provider]    Family History Family History  Problem Relation Age of Onset  . Heart attack Mother   . Heart disease Mother   . Hyperlipidemia Mother   . Hypertension Mother   . Heart attack  Father   . Heart disease Father        Before age 36  . Hyperlipidemia Father   . Hypertension Father   . Cancer Sister   . Aneurysm Brother   . Cancer Brother     Social History Social History  Substance Use Topics  . Smoking status: Current Every Day Smoker    Packs/day: 0.50    Types: Cigarettes    Last attempt to quit: 01/27/2014  . Smokeless tobacco: Never Used  . Alcohol use No     Allergies   Patient has no known allergies.   Review of Systems Review of Systems  Constitutional: Negative for chills and fever.  HENT: Negative for ear pain and sore throat.   Eyes: Positive for visual disturbance. Negative for photophobia and pain.  Respiratory: Negative for cough and shortness of breath.   Cardiovascular: Negative for chest pain and palpitations.  Gastrointestinal: Negative for abdominal pain and vomiting.  Genitourinary: Negative for dysuria and hematuria.  Musculoskeletal:  Negative for arthralgias and back pain.  Skin: Negative for color change and rash.  Neurological: Negative for seizures and syncope.  All other systems reviewed and are negative.    Physical Exam Updated Vital Signs BP (!) 190/91 (BP Location: Right Arm)   Pulse 81   Temp 98.2 F (36.8 C) (Axillary)   Resp 18   Ht 5\' 3"  (1.6 m)   Wt 45.3 kg (99 lb 14.4 oz)   SpO2 97%   BMI 17.70 kg/m   Physical Exam  Constitutional: She is oriented to person, place, and time. She appears well-developed and well-nourished. No distress.  HENT:  Head: Normocephalic and atraumatic.  Eyes: Pupils are equal, round, and reactive to light. Conjunctivae are normal.  Neck: Normal range of motion. Neck supple.  Cardiovascular: Normal rate, regular rhythm, normal heart sounds and intact distal pulses.   No murmur heard. Pulmonary/Chest: Effort normal and breath sounds normal. No respiratory distress.  Abdominal: Soft. There is no tenderness.  Musculoskeletal: Normal range of motion. She exhibits no edema.         Right shoulder: She exhibits tenderness, crepitus and pain. She exhibits normal range of motion, no bony tenderness, no swelling, no effusion, no deformity and no spasm.  Neurological: She is alert and oriented to person, place, and time. She displays tremor. No cranial nerve deficit or sensory deficit. She displays no seizure activity.  Patient has neck and upper extremity tremors present.  She has a pronator drift 1+ on the left side.  Subtle strength difference in right lower extremity versus left (3/5 vs 4/5).   Skin: Skin is warm and dry. No rash noted. She is not diaphoretic. No erythema.  Psychiatric: She has a normal mood and affect.  Nursing note and vitals reviewed.  ED Treatments / Results  Labs (all labs ordered are listed, but only abnormal results are displayed) Labs Reviewed  BASIC METABOLIC PANEL - Abnormal; Notable for the following:       Result Value   CO2 17 (*)    Glucose, Bld 127 (*)    BUN 37 (*)    Creatinine, Ser 2.38 (*)    GFR calc non Af Amer 19 (*)    GFR calc Af Amer 22 (*)    All other components within normal limits  CBC - Abnormal; Notable for the following:    RBC 3.01 (*)    Hemoglobin 8.4 (*)    HCT 24.8 (*)    RDW 15.9 (*)    All other components within normal limits  RENAL FUNCTION PANEL - Abnormal; Notable for the following:    Sodium 134 (*)    CO2 21 (*)    BUN 34 (*)    Creatinine, Ser 2.19 (*)    Calcium 8.8 (*)    Albumin 3.1 (*)    GFR calc non Af Amer 21 (*)    GFR calc Af Amer 24 (*)    All other components within normal limits  TROPONIN I  URINALYSIS, ROUTINE W REFLEX MICROSCOPIC  BASIC METABOLIC PANEL  NA AND K (SODIUM & POTASSIUM), RAND UR  CREATININE, URINE, RANDOM  VITAMIN B12  FOLATE  IRON AND TIBC  FERRITIN  RETICULOCYTES  POC OCCULT BLOOD, ED    EKG  EKG Interpretation  Date/Time:  Thursday June 23 2017 12:28:24 EDT Ventricular Rate:  70 PR Interval:  178 QRS Duration: 72 QT Interval:  478 QTC  Calculation: 516 R Axis:   86 Text Interpretation:  Normal  sinus rhythm Left ventricular hypertrophy with repolarization abnormality Prolonged QT Abnormal ECG No significant change since last tracing Confirmed by Addison Lank [phone]) on 06/23/2017 4:37:04 PM       Radiology Ct Head Wo Contrast  Result Date: 06/23/2017 CLINICAL DATA:  Acute headache starting at 3 a.m. Hypertension. Dizziness and blurred vision. EXAM: CT HEAD WITHOUT CONTRAST TECHNIQUE: Contiguous axial images were obtained from the base of the skull through the vertex without intravenous contrast. COMPARISON:  12/27/2016 FINDINGS: Brain: The brainstem, cerebellum, cerebral peduncles, thalami, basal ganglia, basilar cisterns, and ventricular system appear within normal limits. Periventricular white matter and corona radiata hypodensities favor chronic ischemic microvascular white matter disease. No intracranial hemorrhage, mass lesion, or acute CVA. Vascular: There is atherosclerotic calcification of the cavernous carotid arteries bilaterally. Skull: Unremarkable Sinuses/Orbits: Chronic bilateral maxillary sinusitis. Prior right ethmoidectomy and maxillary antrostomy. Small bilateral mastoid effusions. Other: Incidental failure fusion of the posterior arch of C1. IMPRESSION: 1. No acute intracranial findings to explain the patient's current headache. 2. Periventricular white matter and corona radiata hypodensities favor chronic ischemic microvascular white matter disease. 3. Chronic bilateral maxillary sinusitis. Small bilateral mastoid effusions. 4. Atherosclerosis. Electronically Signed   By: Van Clines M.D.   On: 06/23/2017 13:54    Procedures Procedures (including critical care time)  Medications Ordered in ED Medications  lactated ringers infusion ( Intravenous New Bag/Given 06/23/17 2056)  ALPRAZolam Duanne Moron) tablet 0.5 mg (0.5 mg Oral Given 06/23/17 2143)  busPIRone (BUSPAR) tablet 15 mg (15 mg Oral Given 06/23/17 2144)    gabapentin (NEURONTIN) capsule 300 mg (300 mg Oral Given 06/23/17 2143)  labetalol (NORMODYNE) tablet 300 mg (300 mg Oral Given 06/23/17 2144)  amLODipine (NORVASC) tablet 10 mg (not administered)  clonazePAM (KLONOPIN) tablet 1 mg (1 mg Oral Given 06/23/17 2143)  ferrous sulfate tablet 325 mg (not administered)  acetaminophen (TYLENOL) tablet 650 mg (not administered)  citalopram (CELEXA) tablet 40 mg (40 mg Oral Given 06/23/17 2047)  traZODone (DESYREL) tablet 50 mg (not administered)  rosuvastatin (CRESTOR) tablet 10 mg (10 mg Oral Given 06/23/17 2047)  heparin injection 5,000 Units (5,000 Units Subcutaneous Given 06/23/17 2258)  ondansetron (ZOFRAN) tablet 4 mg (not administered)    Or  ondansetron (ZOFRAN) injection 4 mg (not administered)  senna-docusate (Senokot-S) tablet 1 tablet (not administered)  hydrALAZINE (APRESOLINE) injection 10 mg (10 mg Intravenous Given 06/23/17 2308)  HYDROcodone-acetaminophen (NORCO/VICODIN) 5-325 MG per tablet 1-2 tablet (2 tablets Oral Given 06/23/17 2308)  lactated ringers bolus 1,000 mL (0 mLs Intravenous Stopped 06/23/17 1705)  LORazepam (ATIVAN) tablet 1 mg (1 mg Oral Given 06/23/17 1602)  acetaminophen (TYLENOL) tablet 1,000 mg (1,000 mg Oral Given 06/23/17 1602)  lactated ringers bolus 1,000 mL (0 mLs Intravenous Stopped 06/23/17 2009)     Initial Impression / Assessment and Plan / ED Course  I have reviewed the triage vital signs and the nursing notes.  Pertinent labs & imaging results that were available during my care of the patient were reviewed by me and considered in my medical decision making (see chart for details).     This is a 76 year old female with PMH of migraines, HTN, homelessness medication chronic pain with admissions in the past for polypharmacy who presents with headaches that began suddenly at 3 AM today.  In triage patient was found to have systolic blood pressure greater than 200.  She states she also has dizziness and blurred vision.  In  triage patient had no neurological deficits.  On my assessment, patient has  slight pronator drift present in the left upper extremity.  She has tremulousness to her upper extremities and neck.  There is a slight difference in strength between the right extremity versus left which the patient states is not new.  She is grossly intact with no obvious major focal deficits present in her extremities. Cranial nerves intact as above.  Basic laboratory studies drawn including CBC, BMP, urinalysis.  BMP shows creatinine rise to 2.38 compared to March 2018 when it was 1.04, concerning for AKI likely secondary to poorly controlled blood pressure.  Given hypertensive urgency, patient given IV fluids. Patient's blood pressure being in the 130s-150s on my assessment, no medications are indicated at this time.  CT head showed chronic microvascular changes consistent with poorly controlled hypertension.  Patient given Ativan 1 mg p.o. with suspicion for benzodiazepine withdrawal given her tremors and not taking her scheduled Klonopin as she usually does every day.  Discussed admission for Obs and IV fluid therapy, hospitalist agreed.  Final Clinical Impressions(s) / ED Diagnoses   Final diagnoses:  AKI (acute kidney injury) [facility])  Essential hypertension    New Prescriptions Current Discharge Medication List       Aldona Lento, MD 06/24/17 [phone]

## 2017-06-24 ENCOUNTER — Encounter (HOSPITAL_COMMUNITY): Payer: Self-pay | Admitting: Student

## 2017-06-24 DIAGNOSIS — I10 Essential (primary) hypertension: Secondary | ICD-10-CM | POA: Diagnosis not present

## 2017-06-24 DIAGNOSIS — N179 Acute kidney failure, unspecified: Secondary | ICD-10-CM

## 2017-06-24 LAB — IRON AND TIBC
Iron: 55 ug/dL (ref 28–170)
Saturation Ratios: 16 % (ref 10.4–31.8)
TIBC: 344 ug/dL (ref 250–450)
UIBC: 289 ug/dL

## 2017-06-24 LAB — URINALYSIS, ROUTINE W REFLEX MICROSCOPIC
Bacteria, UA: NONE SEEN
Bilirubin Urine: NEGATIVE
Glucose, UA: NEGATIVE mg/dL
Hgb urine dipstick: NEGATIVE
Ketones, ur: NEGATIVE mg/dL
Leukocytes, UA: NEGATIVE
Nitrite: NEGATIVE
Protein, ur: 30 mg/dL — AB
Specific Gravity, Urine: 1.008 (ref 1.005–1.030)
WBC, UA: NONE SEEN WBC/hpf (ref 0–5)
pH: 7 (ref 5.0–8.0)

## 2017-06-24 LAB — RETICULOCYTES
RBC.: 2.84 MIL/uL — ABNORMAL LOW (ref 3.87–5.11)
Retic Count, Absolute: 42.6 10*3/uL (ref 19.0–186.0)
Retic Ct Pct: 1.5 % (ref 0.4–3.1)

## 2017-06-24 LAB — RENAL FUNCTION PANEL
Albumin: 3.1 g/dL — ABNORMAL LOW (ref 3.5–5.0)
Anion gap: 9 (ref 5–15)
BUN: 34 mg/dL — ABNORMAL HIGH (ref 6–20)
CO2: 21 mmol/L — ABNORMAL LOW (ref 22–32)
Calcium: 8.8 mg/dL — ABNORMAL LOW (ref 8.9–10.3)
Chloride: 104 mmol/L (ref 101–111)
Creatinine, Ser: 2.19 mg/dL — ABNORMAL HIGH (ref 0.44–1.00)
GFR calc Af Amer: 24 mL/min — ABNORMAL LOW (ref >60)
GFR calc non Af Amer: 21 mL/min — ABNORMAL LOW (ref >60)
Glucose, Bld: 94 mg/dL (ref 65–99)
Phosphorus: 3.7 mg/dL (ref 2.5–4.6)
Potassium: 4.3 mmol/L (ref 3.5–5.1)
Sodium: 134 mmol/L — ABNORMAL LOW (ref 135–145)

## 2017-06-24 LAB — VITAMIN B12: Vitamin B-12: 322 pg/mL (ref 180–914)

## 2017-06-24 LAB — FERRITIN: Ferritin: 32 ng/mL (ref 11–307)

## 2017-06-24 LAB — BASIC METABOLIC PANEL
Anion gap: 7 (ref 5–15)
BUN: 32 mg/dL — ABNORMAL HIGH (ref 6–20)
CO2: 21 mmol/L — ABNORMAL LOW (ref 22–32)
Calcium: 8.8 mg/dL — ABNORMAL LOW (ref 8.9–10.3)
Chloride: 108 mmol/L (ref 101–111)
Creatinine, Ser: 2 mg/dL — ABNORMAL HIGH (ref 0.44–1.00)
GFR calc Af Amer: 27 mL/min — ABNORMAL LOW (ref >60)
GFR calc non Af Amer: 24 mL/min — ABNORMAL LOW (ref >60)
Glucose, Bld: 103 mg/dL — ABNORMAL HIGH (ref 65–99)
Potassium: 4.3 mmol/L (ref 3.5–5.1)
Sodium: 136 mmol/L (ref 135–145)

## 2017-06-24 LAB — CREATININE, URINE, RANDOM: Creatinine, Urine: 52.03 mg/dL

## 2017-06-24 LAB — OCCULT BLOOD, POC DEVICE: Fecal Occult Bld: POSITIVE — AB

## 2017-06-24 LAB — NA AND K (SODIUM & POTASSIUM), RAND UR
Potassium Urine: 13 mmol/L
Sodium, Ur: 72 mmol/L

## 2017-06-24 LAB — FOLATE: Folate: 12.3 ng/mL (ref >5.9)

## 2017-06-24 MED ORDER — CLONIDINE HCL 0.1 MG PO TABS: 0.1000 mg | ORAL_TABLET | Freq: Three times a day (TID) | ORAL | 0 refills | Status: DC

## 2017-06-24 NOTE — Care Management Obs Status (Signed)
[facility] NOTIFICATION   Patient Details  Name: Alyssa [name] MRN: [id number] Date of Birth: December 30, 1940   Medicare Observation Status Notification Given:  Yes    Marilu Favre, RN 06/24/2017, 10:41 AM

## 2017-06-24 NOTE — Discharge Summary (Signed)
Physician Discharge Summary  Alyssa [name] MOL:[id number] DOB: Oct 26, 1940 DOA: 06/23/2017  PCP: Everardo Beals, NP  Admit date: 06/23/2017 Discharge date: 06/24/2017  Admitted From: Home Disposition: Home   Recommendations for Outpatient Follow-up:  1. Follow up with PCP in 1-2 weeks 2. Please obtain BMP/CBC in one week 3. Will need nephrology follow up. 4. Complete medication reconciliation. Pt's current medication list has 3 different benzodiazepines.   Home Health: None Equipment/Devices: None Discharge Condition: Stable CODE STATUS: Full Diet recommendation: Heart healthy  Brief/Interim Summary: Alyssa [name] is a 76 y.o. female with medical history significant of significant for anxiety, resistant hypertension, migraines and chronic pain presents to the emergency department complaining of headaches and malaise. Patient reported that headache started this morning she felt that this was her blood pressure. She took her blood pressure medications but she did not have any improvement and progressively became malaise. She reported that he has been workup for high blood pressure as he has been very difficult to treat for the past 3-6 months. She visited her cardiologist who did some adjustment to her medications and found that she has renal artery stenosis. Patient reported that she went on a renal angiogram but no records available. Patient at the time of my evaluation was feeling much better after receiving IV fluids and denies chest pain, shortness of breath, palpitations, dizziness and weakness.   ED Course: She was found to be slightly tremulous, creatinine of 2.38 up from 1.0, unknown exact chronicity. Blood pressure 150/87. Hemoglobin of 8.4. otherwise unremarkable   Discharge Diagnoses:  Active Problems:   AKI (acute kidney injury) ([place])  Acute renal failurein setting of poorly controlled hypertension,use of NSAIDs and renal artery stenosis. Cr in March 1.04 which  has been gradually worsen, no data available. Cr upon admission 2.38. Also patient report that she went under renal angiogram but no records available. No UTI. - Cr likely becoming new baseline in view of stenosis, per patient she was told that one of her kidneys is not working. SCr improved overnight, pt feels well.  - Needs nephrology follow up, but no further inpatient work up is necessary.   Resistant HTN: Patient has been seen by cardiology  - Continue medications as below.   Anemia of chronic disease: Hgb 8.4. Anemia panel unremarkable. No overt bleeding - Monitor CBC at follow up. Consider GI referral.   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 You are stable for discharge with the following recommendations:  - Continue taking the blood pressure medications as you were except, start taking clonidine three times daily instead of once daily (a new prescription was sent to your pharmacy).  - Follow up with your PCP in the next week for repeat check up and labs.  - If your symptoms worsen, seek medical attention right away.   Increase activity slowly    Complete by:  As directed      Allergies as of 06/24/2017   No Known Allergies     Medication List    TAKE these medications   acetaminophen 325 MG tablet Commonly known as:  TYLENOL Take 2 tablets (650 mg total) by mouth every 6 (six) hours as needed for mild pain (or  Fever >/= 101).   ALPRAZolam 0.5 MG tablet Commonly known as:  XANAX Take 0.5 mg by mouth at bedtime.   amLODipine 10 MG tablet Commonly known as:  NORVASC Take 1 tablet (10 mg total) by mouth daily.   citalopram 40 MG tablet Commonly known as:  CELEXA Take 1 tablet (40 mg total) by mouth  daily.   clonazePAM 1 MG tablet Commonly known as:  KLONOPIN Take 1 tablet (1 mg total) by mouth 2 (two) times daily. What changed:  how much to take   cloNIDine 0.1 MG tablet Commonly known as:  CATAPRES Take 1 tablet (0.1 mg total) by mouth 3 (three) times daily. What changed:  when to take this   ergocalciferol 50000 units capsule Commonly known as:  VITAMIN D2 Take 50,000 Units by mouth once a week.   ferrous sulfate 325 (65 FE) MG tablet Take 1 tablet (325 mg total) by mouth every Monday, Wednesday, and Friday. With breakfast   gabapentin 300 MG capsule Commonly known as:  NEURONTIN Take 1 capsule by mouth 3 (three) times daily.   labetalol 300 MG tablet Commonly known as:  NORMODYNE Take 1 tablet by mouth 2 (two) times daily.   lithium 300 MG tablet Take 300 mg by mouth daily.   multivitamin with minerals Tabs tablet Take 1 tablet by mouth daily. One a Day Women's   rosuvastatin 10 MG tablet Commonly known as:  CRESTOR Take 10 mg by mouth daily.   temazepam 30 MG capsule Commonly known as:  RESTORIL Take 30 mg by mouth at bedtime.   valsartan 320 MG tablet Commonly known as:  DIOVAN Take 320 mg by mouth daily.            Discharge Care Instructions        Start     Ordered   06/24/17 0000  cloNIDine (CATAPRES) 0.1 MG tablet  3 times daily     06/24/17 1329   06/24/17 0000  Increase activity slowly     06/24/17 1329   06/24/17 0000  Diet - low sodium heart healthy     06/24/17 1329   06/24/17 0000  Discharge instructions    Comments:  You were admitted for uncontrolled hypertension which has improved with medications. You are stable for discharge with the following recommendations:  - Continue taking the blood pressure medications as you were except, start taking clonidine three times daily instead of once daily (a new prescription was sent to your pharmacy).  - Follow up with your PCP in the next week for repeat check up and labs.  - If your  symptoms worsen, seek medical attention right away.   06/24/17 1329     Follow-up Information    Everardo Beals, NP. Schedule an appointment as soon as possible for a visit in 2 week(s).   Contact information: [facility] [place] [address] [phone]          No Known Allergies  Consultations:  None  Procedures/Studies: Ct Head Wo Contrast  Result Date: 06/23/2017 CLINICAL DATA:  Acute headache starting at 3 a.m. Hypertension. Dizziness and blurred vision. EXAM: CT HEAD WITHOUT CONTRAST TECHNIQUE: Contiguous axial images were obtained from the base of the skull through the vertex without intravenous contrast. COMPARISON:  12/27/2016 FINDINGS: Brain: The brainstem, cerebellum, cerebral peduncles, thalami, basal ganglia, basilar cisterns, and ventricular system appear within normal limits. Periventricular white matter and corona radiata hypodensities favor chronic ischemic microvascular white matter disease.  No intracranial hemorrhage, mass lesion, or acute CVA. Vascular: There is atherosclerotic calcification of the cavernous carotid arteries bilaterally. Skull: Unremarkable Sinuses/Orbits: Chronic bilateral maxillary sinusitis. Prior right ethmoidectomy and maxillary antrostomy. Small bilateral mastoid effusions. Other: Incidental failure fusion of the posterior arch of C1. IMPRESSION: 1. No acute intracranial findings to explain the patient's current headache. 2. Periventricular white matter and corona radiata hypodensities favor chronic ischemic microvascular white matter disease. 3. Chronic bilateral maxillary sinusitis. Small bilateral mastoid effusions. 4. Atherosclerosis. Electronically Signed   By: Van Clines M.D.   On: 06/23/2017 13:54   Subjective: Feels well. No chest pain, dyspnea, change in UOP, fever.   Discharge Exam: Vitals:   06/24/17 0700 06/24/17 0946  BP: (!) 159/74 (!) 162/77  Pulse: 88 92  Resp:  19  Temp:   99.4 F (37.4 C)  SpO2:  98%   General: Pt is alert, awake, not in acute distress Cardiovascular: RRR, S1/S2 +, no rubs, no gallops Respiratory: CTA bilaterally, no wheezing, no rhonchi Abdominal: Soft, NT, ND, bowel sounds + Extremities: No edema, no cyanosis  Labs: Basic Metabolic Panel:  Recent Labs Lab 06/23/17 1225 06/23/17 2249 06/24/17 0531  NA 136 134* 136  K 4.3 4.3 4.3  CL 108 104 108  CO2 17* 21* 21*  GLUCOSE 127* 94 103*  BUN 37* 34* 32*  CREATININE 2.38* 2.19* 2.00*  CALCIUM 9.0 8.8* 8.8*  PHOS  --  3.7  --    Liver Function Tests:  Recent Labs Lab 06/23/17 2249  ALBUMIN 3.1*   CBC:  Recent Labs Lab 06/23/17 1225  WBC 7.9  HGB 8.4*  HCT 24.8*  MCV 82.4  PLT 283   Cardiac Enzymes:  Recent Labs Lab 06/23/17 1557  TROPONINI <0.03   Anemia work up  Recent Labs  06/24/17 0531  VITAMINB12 322  FOLATE 12.3  FERRITIN 32  TIBC 344  IRON 55  RETICCTPCT 1.5   Urinalysis    Component Value Date/Time   COLORURINE STRAW (A) 06/24/2017 0228   APPEARANCEUR CLEAR 06/24/2017 0228   LABSPEC 1.008 06/24/2017 0228   PHURINE 7.0 06/24/2017 0228   GLUCOSEU NEGATIVE 06/24/2017 0228   HGBUR NEGATIVE 06/24/2017 0228   BILIRUBINUR NEGATIVE 06/24/2017 0228   KETONESUR NEGATIVE 06/24/2017 0228   PROTEINUR 30 (A) 06/24/2017 0228   UROBILINOGEN 0.2 08/24/2015 1737   NITRITE NEGATIVE 06/24/2017 0228   LEUKOCYTESUR NEGATIVE 06/24/2017 0228   Time coordinating discharge: Approximately 40 minutes  Vance Gather, MD  [facility] 06/24/2017, 1:30 PM Pager [phone]

## 2017-06-24 NOTE — Progress Notes (Signed)
Pt discharged home in stable condition after going over discharge teaching with no concerns voices. AVS given to pt before discharge

## 2017-06-25 IMAGING — CT CT L SPINE W/O CM
3 of 13 series · 8 of 33 positions shown, 9 images · non-contrast
Comparison: Radiography most recently 12/05/2015. Also 09/30/2015
and 08/01/2015. MRI 07/30/2015.

CLINICAL DATA: Spondylolisthesis. Lumbar decompression and fusion
July 2015. Left leg numbness and bilateral leg pain.

EXAM:
CT LUMBAR SPINE WITHOUT CONTRAST
TECHNIQUE: Multidetector CT imaging of the lumbar spine was performed without
intravenous contrast administration. Multiplanar CT image
reconstructions were also generated.

[Series 5: l spine detail · axial · 0.28mm/px · z∈[+19,+89]mm · 2 of 86 slices shown, 3 images]
[im 29/86  soft-tissue]
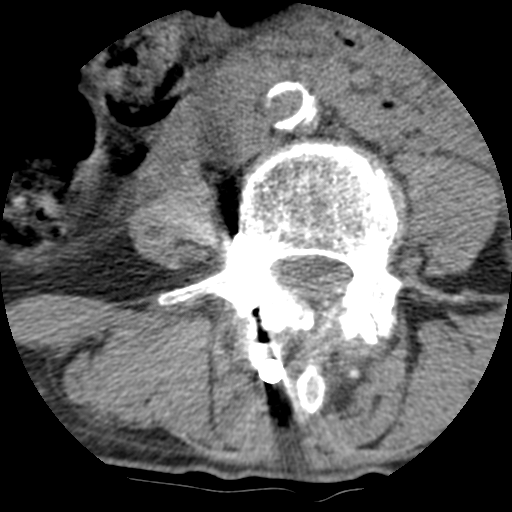
[im 29/86  bone]
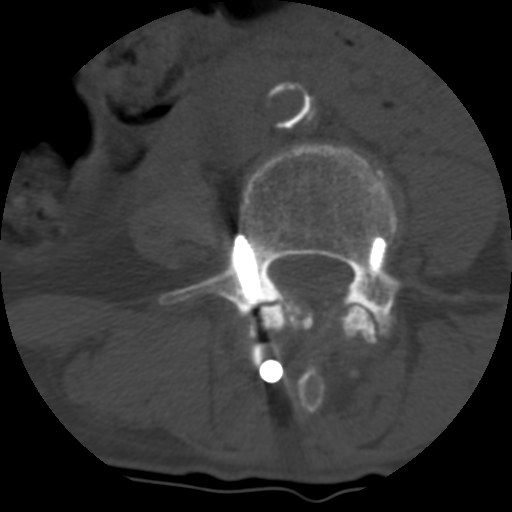
[im 57/86  bone]
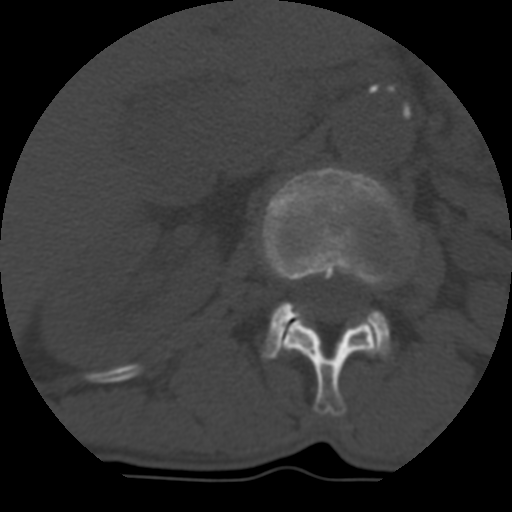

[Series 201: cor 2 · coronal · 0.43mm/px · 1 of 49 slices shown]
[im 25/49  bone]
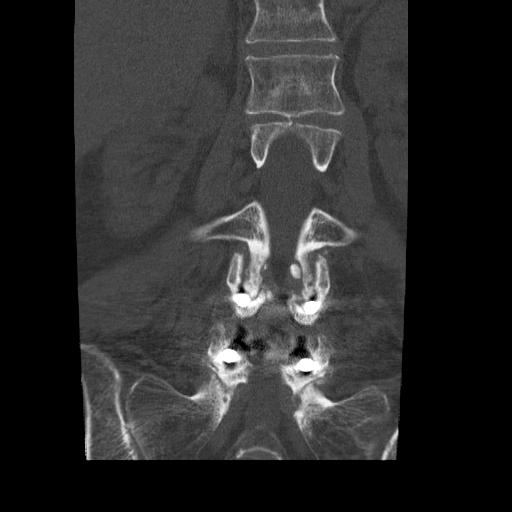

[Series 202: sag · sagittal · 0.43mm/px · 5 of 61 slices shown]
[im 11/61  bone]
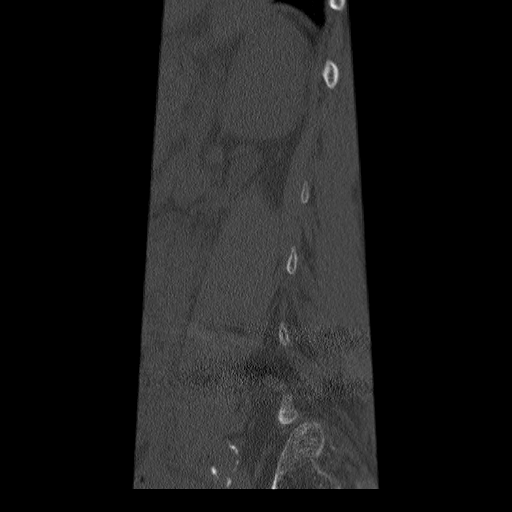
[im 21/61  bone]
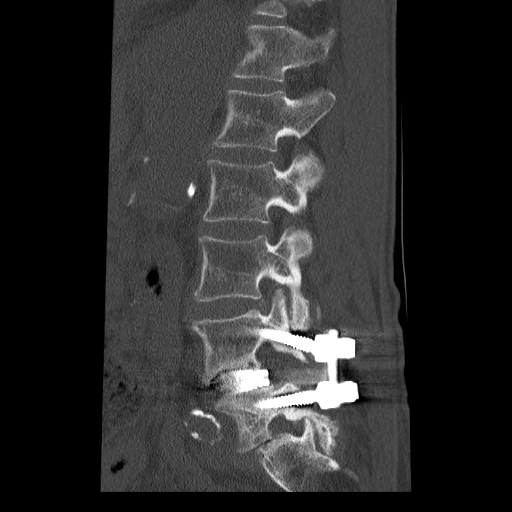
[im 31/61  bone]
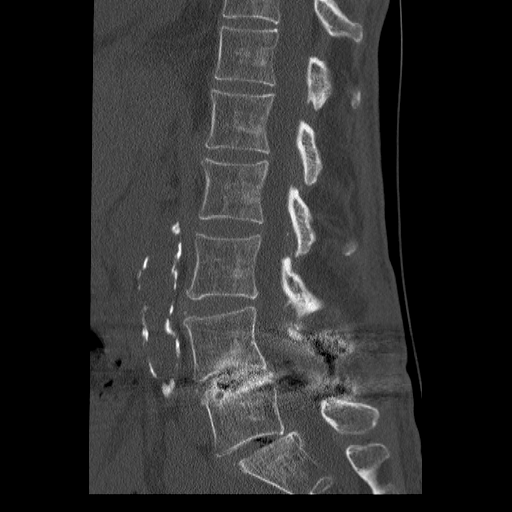
[im 41/61  bone]
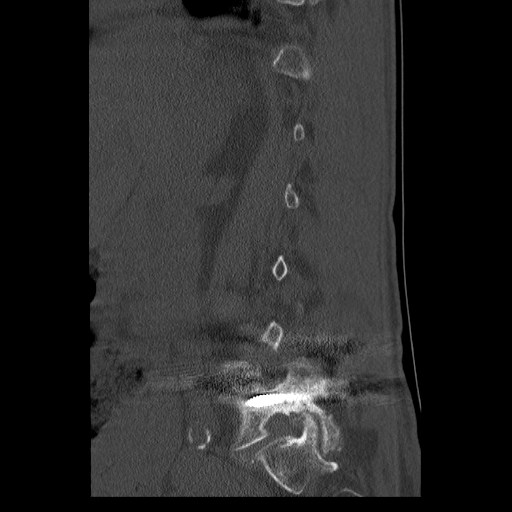
[im 51/61  bone]
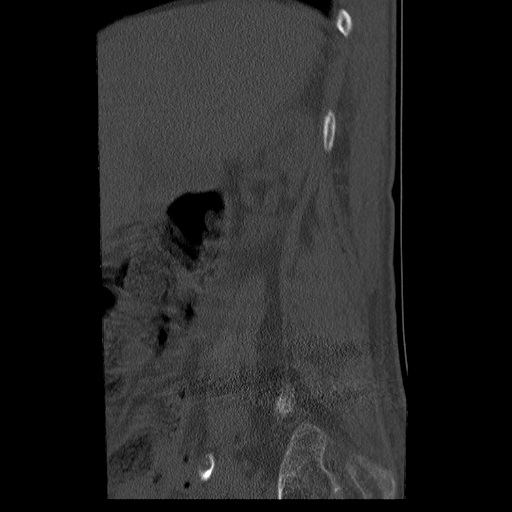

[8 of 33 positions shown; findings below may reference images not displayed]

FINDINGS: No significant finding at L2-3 or above. Some renal atrophy on the
right. Aortic atherosclerosis.

L3-4: Disc degeneration with circumferential bulging. Bilateral
facet degeneration and hypertrophy. Ligamentous calcification left
more than right. Stenosis of both lateral recesses and neural
foramina. This appears more pronounced than on the previous MRI.

L4-5: Previous posterior decompression, diskectomy and fusion
procedure. To interbody fusion devices in place. I think there is
some lucency around the L5 screws. There is mild-to-moderate
subsidence. I do not see any nitrogen gas. If fusion has not
occurred, it appears nearly present. Wide patency of the canal and
foramina.

L5-S1: Advanced bilateral facet arthropathy with 5-6 mm of
anterolisthesis. Disc degeneration with loss of disc height and
vacuum phenomenon and bulging of the disc. Stenosis of the
subarticular lateral recesses and foramina that could cause neural
compression on either or both sides.
IMPRESSION: Worsening of adjacent segment degenerative disease at L3-4.
Circumferential bulging of the disc. Facet and ligamentous
hypertrophy with ligamentous calcification. Stenosis of the lateral
recesses and foramina that could cause neural compression.

Decompression and fusion procedure at L4-5. I think fusion has
occurred or nearly occurred. Moderate subsidence of the interbody
devices. Some lucency around the screws at L5.

L5-S1: Advanced bilateral facet arthropathy with 5 6 mm of
anterolisthesis. Disc degeneration with bulging of the disc.
Stenosis of the lateral recesses and neural foramina that could
cause neural compression on either or both sides.

## 2017-06-29 ENCOUNTER — Other Ambulatory Visit (INDEPENDENT_AMBULATORY_CARE_PROVIDER_SITE_OTHER): Payer: Self-pay | Admitting: Orthopaedic Surgery

## 2017-06-29 NOTE — Telephone Encounter (Signed)
Ok for refill? 

## 2017-09-18 ENCOUNTER — Other Ambulatory Visit: Payer: Self-pay

## 2017-09-18 ENCOUNTER — Encounter (HOSPITAL_COMMUNITY): Payer: Self-pay | Admitting: *Deleted

## 2017-09-18 ENCOUNTER — Emergency Department (HOSPITAL_COMMUNITY): Payer: Medicare HMO

## 2017-09-18 DIAGNOSIS — Z96641 Presence of right artificial hip joint: Secondary | ICD-10-CM | POA: Diagnosis not present

## 2017-09-18 DIAGNOSIS — I10 Essential (primary) hypertension: Secondary | ICD-10-CM | POA: Diagnosis not present

## 2017-09-18 DIAGNOSIS — F17210 Nicotine dependence, cigarettes, uncomplicated: Secondary | ICD-10-CM | POA: Insufficient documentation

## 2017-09-18 DIAGNOSIS — Z79899 Other long term (current) drug therapy: Secondary | ICD-10-CM | POA: Insufficient documentation

## 2017-09-18 DIAGNOSIS — G43909 Migraine, unspecified, not intractable, without status migrainosus: Secondary | ICD-10-CM | POA: Insufficient documentation

## 2017-09-18 MED ORDER — ALBUTEROL SULFATE (2.5 MG/3ML) 0.083% IN NEBU: 5.0000 mg | INHALATION_SOLUTION | Freq: Once | RESPIRATORY_TRACT | Status: DC

## 2017-09-18 NOTE — ED Triage Notes (Signed)
Rad tech brought pt back to triage, unable to do xray, states that patient started breathing heavy and was sweaty. Brought pt back to triage room for assessment,  tachypnea 28-30 initially but improved with discussing breathing techniques, scattered rhonchi. HR 96, oxygen 100%, resp 21 after calming. Pt says her shortness of breath started just before coming to the ED and she has had a cough.

## 2017-09-18 NOTE — ED Triage Notes (Signed)
Pt reports right frontal migraine headache that started this morning. She took excedrin migraine without relief. Pt also has had shortness of breath since today. Denies fever, n/v, visual changes, or cough.

## 2017-09-19 ENCOUNTER — Emergency Department (HOSPITAL_COMMUNITY)
Admission: EM | Admit: 2017-09-19 | Discharge: 2017-09-19 | Disposition: A | Discharge status: Home / Self Care | Payer: Medicare HMO | Attending: Emergency Medicine | Admitting: Emergency Medicine

## 2017-09-19 DIAGNOSIS — G43909 Migraine, unspecified, not intractable, without status migrainosus: Secondary | ICD-10-CM | POA: Diagnosis not present

## 2017-09-19 DIAGNOSIS — I10 Essential (primary) hypertension: Secondary | ICD-10-CM

## 2017-09-19 LAB — BASIC METABOLIC PANEL
Anion gap: 8 (ref 5–15)
BUN: 34 mg/dL — ABNORMAL HIGH (ref 6–20)
CO2: 17 mmol/L — ABNORMAL LOW (ref 22–32)
Calcium: 9 mg/dL (ref 8.9–10.3)
Chloride: 112 mmol/L — ABNORMAL HIGH (ref 101–111)
Creatinine, Ser: 1.8 mg/dL — ABNORMAL HIGH (ref 0.44–1.00)
GFR calc Af Amer: 31 mL/min — ABNORMAL LOW (ref >60)
GFR calc non Af Amer: 27 mL/min — ABNORMAL LOW (ref >60)
Glucose, Bld: 104 mg/dL — ABNORMAL HIGH (ref 65–99)
Potassium: 4.1 mmol/L (ref 3.5–5.1)
Sodium: 137 mmol/L (ref 135–145)

## 2017-09-19 LAB — CBC WITH DIFFERENTIAL/PLATELET
Basophils Absolute: 0.1 10*3/uL (ref 0.0–0.1)
Basophils Relative: 1 %
Eosinophils Absolute: 0.2 10*3/uL (ref 0.0–0.7)
Eosinophils Relative: 3 %
HCT: 26.1 % — ABNORMAL LOW (ref 36.0–46.0)
Hemoglobin: 8.8 g/dL — ABNORMAL LOW (ref 12.0–15.0)
Lymphocytes Relative: 23 %
Lymphs Abs: 1.4 10*3/uL (ref 0.7–4.0)
MCH: 27.2 pg (ref 26.0–34.0)
MCHC: 33.7 g/dL (ref 30.0–36.0)
MCV: 80.8 fL (ref 78.0–100.0)
Monocytes Absolute: 0.7 10*3/uL (ref 0.1–1.0)
Monocytes Relative: 11 %
Neutro Abs: 3.8 10*3/uL (ref 1.7–7.7)
Neutrophils Relative %: 62 %
Platelets: 329 10*3/uL (ref 150–400)
RBC: 3.23 MIL/uL — ABNORMAL LOW (ref 3.87–5.11)
RDW: 15.8 % — ABNORMAL HIGH (ref 11.5–15.5)
WBC: 6.1 10*3/uL (ref 4.0–10.5)

## 2017-09-19 MED ORDER — FENTANYL CITRATE (PF) 100 MCG/2ML IJ SOLN
50.0000 ug | Freq: Once | INTRAMUSCULAR | Status: AC
  Administered 2017-09-19: 50 ug via INTRAVENOUS
  Filled 2017-09-19: qty 2

## 2017-09-19 MED ORDER — AMLODIPINE BESYLATE 5 MG PO TABS
10.0000 mg | ORAL_TABLET | Freq: Once | ORAL | Status: AC
  Administered 2017-09-19: 10 mg via ORAL
  Filled 2017-09-19: qty 2

## 2017-09-19 MED ORDER — DIPHENHYDRAMINE HCL 50 MG/ML IJ SOLN
12.5000 mg | Freq: Once | INTRAMUSCULAR | Status: AC
  Administered 2017-09-19: 12.5 mg via INTRAVENOUS
  Filled 2017-09-19: qty 1

## 2017-09-19 MED ORDER — KETOROLAC TROMETHAMINE 15 MG/ML IJ SOLN
15.0000 mg | Freq: Once | INTRAMUSCULAR | Status: AC
  Administered 2017-09-19: 15 mg via INTRAVENOUS
  Filled 2017-09-19: qty 1

## 2017-09-19 MED ORDER — MAGNESIUM SULFATE 2 GM/50ML IV SOLN
2.0000 g | Freq: Once | INTRAVENOUS | Status: AC
  Administered 2017-09-19: 2 g via INTRAVENOUS
  Filled 2017-09-19: qty 50

## 2017-09-19 MED ORDER — CLONIDINE HCL 0.1 MG PO TABS
0.1000 mg | ORAL_TABLET | Freq: Once | ORAL | Status: AC
  Administered 2017-09-19: 0.1 mg via ORAL
  Filled 2017-09-19: qty 1

## 2017-09-19 MED ORDER — SODIUM CHLORIDE 0.9 % IV BOLUS (SEPSIS)
1000.0000 mL | Freq: Once | INTRAVENOUS | Status: AC
  Administered 2017-09-19: 1000 mL via INTRAVENOUS

## 2017-09-19 MED ORDER — PROCHLORPERAZINE EDISYLATE 5 MG/ML IJ SOLN
10.0000 mg | Freq: Once | INTRAMUSCULAR | Status: AC
  Administered 2017-09-19: 10 mg via INTRAVENOUS
  Filled 2017-09-19: qty 2

## 2017-09-19 NOTE — ED Provider Notes (Signed)
9:32 AM Patient feels much better at this time.  Discharged home in good condition.  Primary care follow-up.  Patient understands return to the ER for new or worsening symptoms.  Blood pressure improved with use of home medications.   Jola Schmidt, MD 09/19/17 [phone]

## 2017-09-19 NOTE — ED Notes (Signed)
Attempted blood x2

## 2017-09-19 NOTE — ED Provider Notes (Signed)
[facility] EMERGENCY DEPARTMENT Provider Note   CSN: [id number] Arrival date & time: 09/18/17  1844     History   Chief Complaint Chief Complaint  Patient presents with  . Migraine    HPI Alyssa [name] is a 76 y.o. female.  HPI  This is a 76 year old female with a history of hypertension, migraines, anxiety who presents with migraine.  Reports onset of migraine yesterday morning.  She states it is typical for her migraine it is frontal and bilateral.  Currently her pain is 9 out of 10.  She took an Excedrin which did not help.  She states that it used to help her a lot.  She reports nausea without vomiting.  No vision changes, weakness, numbness, tingling.  She states that she feels very anxious and because of this feels short of breath.  This is normal during her migraines.  Denies any chest pain.  Denies any recent illnesses or fevers.  Notably hypertensive as she has been waiting in the waiting room for approximately 12 hours.  Last took her blood pressure medication yesterday.  She takes clonidine and Norvasc.  Past Medical History:  Diagnosis Date  . Anxiety   . Arthritis   . Headache(784.0)   . Hyperlipidemia   . Hypertension   . Joint pain   . Leg pain   . Peripheral neuropathy   . Peripheral vascular disease [facility])     Patient Active Problem List   Diagnosis Date Noted  . AKI (acute kidney injury) ([place]) 06/23/2017  . Hypertensive urgency 12/28/2016  . Elevated serum creatinine 12/28/2016  . Normocytic anemia 12/28/2016  . Chronic pain syndrome 12/28/2016  . 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 Benzodiazepine withdrawal without complication ([place]) 71/03/2693  . Chronic right shoulder pain 12/06/2016  . Fall 09/17/2016  . Fracture of humeral head, closed, right, initial encounter 09/17/2016  . Rib fractures 09/17/2016  . Multiple falls 09/17/2016  . Severe muscle deconditioning 09/17/2016  . Failure to thrive in adult  09/17/2016  . Melena 04/25/2016  . Acute kidney injury ([place]) 04/25/2016  . Essential hypertension 04/25/2016  . Anxiety state 04/25/2016  . Tobacco abuse 04/25/2016  . Hyperlipidemia 04/25/2016  . Syncope 04/24/2016  . Syncope and collapse 04/24/2016  . Gait difficulty 01/12/2016  . Foot drop, left 01/12/2016  . Severe recurrent major depression without psychotic features ([place]) 08/28/2015  . Spondylolisthesis at L4-L5 level 07/30/2015  . PVD (peripheral vascular disease) with claudication ([place]) 07/29/2014  . Weakness-Bilateral arm/leg 07/29/2014  . Numbness-left leg 07/29/2014  . Swelling of limb-Legs 07/29/2014  . Atherosclerosis of native arteries of the extremities with intermittent claudication 09/30/2011    Past Surgical History:  Procedure Laterality Date  . ABDOMINAL ANGIOGRAM N/A 10/29/2011   Procedure: ABDOMINAL ANGIOGRAM;  Surgeon: Elam Dutch, MD;  Location: [facility] CATH LAB;  Service: Cardiovascular;  Laterality: N/A;  . BACK SURGERY    . FEMORAL-FEMORAL BYPASS GRAFT  08/31/2010  . JOINT REPLACEMENT Right 2012   Hip  . LOWER EXTREMITY ANGIOGRAPHY  06/14/2017   Procedure: Lower Extremity Angiography;  Surgeon: Adrian Prows, MD;  Location: [place] CV LAB;  Service: Cardiovascular;;  Bilateral limited angio performed  . RENAL ANGIOGRAPHY N/A 06/14/2017   Procedure: Renal Angiography;  Surgeon: Adrian Prows, MD;  Location: [place] [place] CV LAB;  Service: Cardiovascular;  Laterality: N/A;  . TOTAL HIP ARTHROPLASTY     right    OB History    No data available  Home Medications    Prior to Admission medications   Medication Sig Start Date End Date Taking? Authorizing Provider  acetaminophen (TYLENOL) 325 MG tablet Take 2 tablets (650 mg total) by mouth every 6 (six) hours as needed for mild pain (or Fever >/= 101). 04/26/16  Yes Verlee Monte, MD  amLODipine (NORVASC) 10 MG tablet Take 1 tablet (10 mg total) by mouth daily. 12/29/16  Yes Robbie Lis, MD    citalopram (CELEXA) 40 MG tablet Take 1 tablet (40 mg total) by mouth daily. 08/30/15  Yes Delfin Gant, NP  clonazePAM (KLONOPIN) 1 MG tablet Take 1 tablet (1 mg total) by mouth 2 (two) times daily. Patient taking differently: Take 2 mg by mouth 2 (two) times daily.  12/28/16  Yes Robbie Lis, MD  cloNIDine (CATAPRES) 0.1 MG tablet Take 1 tablet (0.1 mg total) by mouth 3 (three) times daily. 06/24/17  Yes Patrecia Pour, MD  ergocalciferol (VITAMIN D2) 50000 units capsule Take 50,000 Units by mouth once a week.   Yes [provider]  ferrous sulfate 325 (65 FE) MG tablet Take 1 tablet (325 mg total) by mouth every Monday, Wednesday, and Friday. With breakfast 09/20/16  Yes Florencia Reasons, MD  gabapentin (NEURONTIN) 300 MG capsule Take 1 capsule by mouth 3 (three) times daily. 05/16/17  Yes [provider]  labetalol (NORMODYNE) 300 MG tablet Take 1 tablet by mouth 2 (two) times daily. 06/05/17  Yes [provider]  lithium 300 MG tablet Take 300 mg by mouth daily.   Yes [provider]  Multiple Vitamin (MULTIVITAMIN WITH MINERALS) TABS tablet Take 1 tablet by mouth daily. One a Day Women's   Yes [provider]  rosuvastatin (CRESTOR) 10 MG tablet Take 10 mg by mouth daily.    Yes [provider]  temazepam (RESTORIL) 30 MG capsule Take 30 mg by mouth at bedtime.   Yes [provider]  valsartan (DIOVAN) 320 MG tablet Take 320 mg by mouth daily.   Yes [provider]  diclofenac (VOLTAREN) 75 MG EC tablet take 1 tablet by mouth twice a day Patient not taking: Reported on 09/19/2017 06/29/17   Leandrew Koyanagi, MD    Family History Family History  Problem Relation Age of Onset  . Heart attack Mother   . Heart disease Mother   . Hyperlipidemia Mother   . Hypertension Mother   . Heart attack Father   . Heart disease Father        Before age 28  . Hyperlipidemia Father   . Hypertension Father   . Cancer Sister   . Aneurysm  Brother   . Cancer Brother     Social History Social History   Tobacco Use  . Smoking status: Current Every Day Smoker    Packs/day: 0.50    Types: Cigarettes    Last attempt to quit: 01/27/2014    Years since quitting: 3.6  . Smokeless tobacco: Never Used  Substance Use Topics  . Alcohol use: No  . Drug use: No     Allergies   Patient has no known allergies.   Review of Systems Review of Systems  Constitutional: Negative for fever.  Eyes: Negative for visual disturbance.  Respiratory: Negative for shortness of breath.   Cardiovascular: Negative for chest pain.  Gastrointestinal: Positive for nausea. Negative for abdominal pain.  Neurological: Positive for headaches. Negative for dizziness and weakness.  Psychiatric/Behavioral: The patient is nervous/anxious.   All other systems  reviewed and are negative.    Physical Exam Updated Vital Signs BP (!) 204/85   Pulse 77   Temp 98.5 F (36.9 C)   Resp (!) 25   SpO2 100%   Physical Exam  Constitutional: She is oriented to person, place, and time.  Frail, uncomfortable appearing, no acute distress  HENT:  Head: Normocephalic and atraumatic.  Eyes: Pupils are equal, round, and reactive to light.  Neck: Normal range of motion. Neck supple.  Cardiovascular: Normal rate, regular rhythm and normal heart sounds.  Pulmonary/Chest: Effort normal and breath sounds normal. No respiratory distress. She has no wheezes.  Abdominal: Soft. There is no tenderness.  Neurological: She is alert and oriented to person, place, and time.  Cranial nerves II through XII intact, 5 out of 5 strength in all 4 extremities, no dysmetria to finger-nose-finger  Skin: Skin is warm and dry.  Psychiatric: She has a normal mood and affect.  Nursing note and vitals reviewed.    ED Treatments / Results  Labs (all labs ordered are listed, but only abnormal results are displayed) Labs Reviewed  CBC WITH DIFFERENTIAL/PLATELET  BASIC METABOLIC  PANEL    EKG  EKG Interpretation  Date/Time:  Monday September 19 2017 06:48:20 EST Ventricular Rate:  81 PR Interval:    QRS Duration: 85 QT Interval:  442 QTC Calculation: 514 R Axis:   91 Text Interpretation:  Sinus rhythm Right axis deviation Consider left ventricular hypertrophy Nonspecific T abnormalities, lateral leads Prolonged QT interval No significant change since last tracing Confirmed by Thayer Jew [phone]) on 09/19/2017 6:56:54 AM Also confirmed by Thayer Jew [phone]), editor Philomena Doheny [phone])  on 09/19/2017 7:14:23 AM       Radiology Dg Chest 2 View  Result Date: 09/18/2017 CLINICAL DATA:  Acute shortness of breath today. EXAM: CHEST  2 VIEW COMPARISON:  09/17/2016 and prior radiographs FINDINGS: The cardiomediastinal silhouette is unremarkable. COPD/ emphysema changes noted. There is no evidence of focal airspace disease, pulmonary edema, suspicious pulmonary nodule/mass, pleural effusion, or pneumothorax. No acute bony abnormalities are identified. 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 Electronically Signed   By: Margarette Canada M.D.   On: 09/18/2017 20:23    Procedures Procedures (including critical care time)  Medications Ordered in ED Medications  magnesium sulfate IVPB 2 g 50 mL (not administered)  sodium chloride 0.9 % bolus 1,000 mL (1,000 mLs Intravenous New Bag/Given 09/19/17 0656)  prochlorperazine (COMPAZINE) injection 10 mg (10 mg Intravenous Given 09/19/17 0656)  diphenhydrAMINE (BENADRYL) injection 12.5 mg (12.5 mg Intravenous Given 09/19/17 0657)  amLODipine (NORVASC) tablet 10 mg (10 mg Oral Given 09/19/17 0646)  cloNIDine (CATAPRES) tablet 0.1 mg (0.1 mg Oral Given 09/19/17 0646)     Initial Impression / Assessment and Plan / ED Course  I have reviewed the triage vital signs and the nursing notes.  Pertinent labs & imaging results that were available during my care of the patient were reviewed by me  and considered in my medical decision making (see chart for details).     Patient presents with headache.  Onset yesterday morning.  Reports this is typical for her migraines.  Denies any fevers or systemic illness.  Denies additional neurologic symptoms.  She does report anxiety.  She is also notably hypertensive.  Has not taken her blood pressure medications since yesterday.  She was provided with clonidine and Norvasc.  She was also given a migraine cocktail.  Given reassuring neurologic exam, would defer additional CT  scan imaging at this time.  She has had previous CTs that have been negative in the same setting.  Doubt intracranial pathology.  Patient signed out pending treatment and blood pressure recheck.  Final Clinical Impressions(s) / ED Diagnoses   Final diagnoses:  Migraine without status migrainosus, not intractable, unspecified migraine type  Essential hypertension    ED Discharge Orders    None       Merryl Hacker, MD 09/19/17 [phone]

## 2017-09-26 ENCOUNTER — Other Ambulatory Visit: Payer: Self-pay

## 2017-09-26 ENCOUNTER — Encounter (HOSPITAL_COMMUNITY): Payer: Self-pay | Admitting: Emergency Medicine

## 2017-09-26 ENCOUNTER — Emergency Department (HOSPITAL_COMMUNITY)
Admission: EM | Admit: 2017-09-26 | Discharge: 2017-09-26 | Disposition: A | Discharge status: Home / Self Care | Payer: Medicare HMO | Attending: Emergency Medicine | Admitting: Emergency Medicine

## 2017-09-26 DIAGNOSIS — I10 Essential (primary) hypertension: Secondary | ICD-10-CM | POA: Diagnosis not present

## 2017-09-26 DIAGNOSIS — R51 Headache: Secondary | ICD-10-CM | POA: Diagnosis present

## 2017-09-26 DIAGNOSIS — G43001 Migraine without aura, not intractable, with status migrainosus: Secondary | ICD-10-CM | POA: Insufficient documentation

## 2017-09-26 DIAGNOSIS — Z79899 Other long term (current) drug therapy: Secondary | ICD-10-CM | POA: Insufficient documentation

## 2017-09-26 DIAGNOSIS — F17210 Nicotine dependence, cigarettes, uncomplicated: Secondary | ICD-10-CM | POA: Insufficient documentation

## 2017-09-26 MED ORDER — PROCHLORPERAZINE EDISYLATE 5 MG/ML IJ SOLN
10.0000 mg | Freq: Once | INTRAMUSCULAR | Status: AC
  Administered 2017-09-26: 10 mg via INTRAVENOUS
  Filled 2017-09-26: qty 2

## 2017-09-26 MED ORDER — DEXAMETHASONE SODIUM PHOSPHATE 10 MG/ML IJ SOLN
10.0000 mg | Freq: Once | INTRAMUSCULAR | Status: AC
  Administered 2017-09-26: 10 mg via INTRAVENOUS
  Filled 2017-09-26: qty 1

## 2017-09-26 MED ORDER — DIPHENHYDRAMINE HCL 50 MG/ML IJ SOLN
12.5000 mg | Freq: Once | INTRAMUSCULAR | Status: AC
  Administered 2017-09-26: 12.5 mg via INTRAVENOUS
  Filled 2017-09-26: qty 1

## 2017-09-26 NOTE — ED Triage Notes (Signed)
Report received from [facility]>  Pt reports sharp frontal headache with nausea since 8pm.  States it got worse at 11pm.  History of headache.  Pt hypertensive.  No arm drift.  Speech clear.  No neuro deficits noted on triage exam.

## 2017-09-26 NOTE — ED Notes (Signed)
ED Provider at bedside. 

## 2017-09-26 NOTE — ED Provider Notes (Signed)
[facility] EMERGENCY DEPARTMENT Provider Note   CSN: [id number] Arrival date & time: 09/26/17  0131     History   Chief Complaint Chief Complaint  Patient presents with  . Headache  . Hypertension    HPI Alyssa [name] is a 76 y.o. female.  76 yo F with a cc headache.  Going on for past 5 hours.  Feels like prior migraines but is bilateral where is his normally unilateral.  Slow in onset.  Tried Excedrin without improvement.  She denies head injury denies fever denies neck pain.  Worse with bright lights and loud noises.  She denies trauma.  Denies change in her vision change in speech unilateral numbness or weakness.  Denies difficulty with ambulation.     The history is provided by the patient.  Illness  This is a new problem. The current episode started 2 days ago. The problem occurs constantly. The problem has not changed since onset.Associated symptoms include headaches. Pertinent negatives include no chest pain and no shortness of breath. Nothing aggravates the symptoms. Nothing relieves the symptoms. She has tried nothing for the symptoms. The treatment provided no relief.    Past Medical History:  Diagnosis Date  . Anxiety   . Arthritis   . Headache(784.0)   . Hyperlipidemia   . Hypertension   . Joint pain   . Leg pain   . Peripheral neuropathy   . Peripheral vascular disease [facility])     Patient Active Problem List   Diagnosis Date Noted  . AKI (acute kidney injury) ([place]) 06/23/2017  . Hypertensive urgency 12/28/2016  . Elevated serum creatinine 12/28/2016  . Normocytic anemia 12/28/2016  . Chronic pain syndrome 12/28/2016  . Benzodiazepine withdrawal ([place]) 12/28/2016  . Benzodiazepine withdrawal without complication ([place]) 58/85/0277  . Chronic right shoulder pain 12/06/2016  . Fall 09/17/2016  . Fracture of humeral head, closed, right, initial encounter 09/17/2016  . Rib fractures 09/17/2016  . Multiple falls 09/17/2016  . Severe  muscle deconditioning 09/17/2016  . Failure to thrive in adult 09/17/2016  . Melena 04/25/2016  . Acute kidney injury ([place]) 04/25/2016  . Essential hypertension 04/25/2016  . Anxiety state 04/25/2016  . Tobacco abuse 04/25/2016  . Hyperlipidemia 04/25/2016  . Syncope 04/24/2016  . Syncope and collapse 04/24/2016  . Gait difficulty 01/12/2016  . Foot drop, left 01/12/2016  . Severe recurrent major depression without psychotic features ([place]) 08/28/2015  . Spondylolisthesis at L4-L5 level 07/30/2015  . PVD (peripheral vascular disease) with claudication () 07/29/2014  . Weakness-Bilateral arm/leg 07/29/2014  . Numbness-left leg 07/29/2014  . Swelling of limb-Legs 07/29/2014  . Atherosclerosis of native arteries of the extremities with intermittent claudication 09/30/2011    Past Surgical History:  Procedure Laterality Date  . ABDOMINAL ANGIOGRAM N/A 10/29/2011   Procedure: ABDOMINAL ANGIOGRAM;  Surgeon: Elam Dutch, MD;  Location: [facility] CATH LAB;  Service: Cardiovascular;  Laterality: N/A;  . BACK SURGERY    . FEMORAL-FEMORAL BYPASS GRAFT  08/31/2010  . JOINT REPLACEMENT Right 2012   Hip  . LOWER EXTREMITY ANGIOGRAPHY  06/14/2017   Procedure: Lower Extremity Angiography;  Surgeon: Adrian Prows, MD;  Location: [place] CV LAB;  Service: Cardiovascular;;  Bilateral limited angio performed  . RENAL ANGIOGRAPHY N/A 06/14/2017   Procedure: Renal Angiography;  Surgeon: Adrian Prows, MD;  Location: [place] CV LAB;  Service: Cardiovascular;  Laterality: N/A;  . TOTAL HIP ARTHROPLASTY     right    OB History  No data available       Home Medications    Prior to Admission medications   Medication Sig Start Date End Date Taking? Authorizing Provider  acetaminophen (TYLENOL) 325 MG tablet Take 2 tablets (650 mg total) by mouth every 6 (six) hours as needed for mild pain (or Fever >/= 101). 04/26/16  Yes Verlee Monte, MD  amLODipine (NORVASC) 10 MG tablet Take 1 tablet (10 mg  total) by mouth daily. 12/29/16  Yes Robbie Lis, MD  citalopram (CELEXA) 40 MG tablet Take 1 tablet (40 mg total) by mouth daily. 08/30/15  Yes Delfin Gant, NP  clonazePAM (KLONOPIN) 1 MG tablet Take 1 tablet (1 mg total) by mouth 2 (two) times daily. Patient taking differently: Take 2 mg by mouth 2 (two) times daily.  12/28/16  Yes Robbie Lis, MD  cloNIDine (CATAPRES) 0.1 MG tablet Take 1 tablet (0.1 mg total) by mouth 3 (three) times daily. 06/24/17  Yes Patrecia Pour, MD  ergocalciferol (VITAMIN D2) 50000 units capsule Take 50,000 Units by mouth once a week.   Yes [provider]  ferrous sulfate 325 (65 FE) MG tablet Take 1 tablet (325 mg total) by mouth every Monday, Wednesday, and Friday. With breakfast 09/20/16  Yes Florencia Reasons, MD  gabapentin (NEURONTIN) 300 MG capsule Take 1 capsule by mouth 3 (three) times daily. 05/16/17  Yes [provider]  labetalol (NORMODYNE) 300 MG tablet Take 1 tablet by mouth 2 (two) times daily. 06/05/17  Yes [provider]  lithium 300 MG tablet Take 300 mg by mouth daily.   Yes [provider]  Multiple Vitamin (MULTIVITAMIN WITH MINERALS) TABS tablet Take 1 tablet by mouth daily. One a Day Women's   Yes [provider]  rosuvastatin (CRESTOR) 10 MG tablet Take 10 mg by mouth daily.    Yes [provider]  temazepam (RESTORIL) 30 MG capsule Take 30 mg by mouth at bedtime.   Yes [provider]    Family History Family History  Problem Relation Age of Onset  . Heart attack Mother   . Heart disease Mother   . Hyperlipidemia Mother   . Hypertension Mother   . Heart attack Father   . Heart disease Father        Before age 75  . Hyperlipidemia Father   . Hypertension Father   . Cancer Sister   . Aneurysm Brother   . Cancer Brother     Social History Social History   Tobacco Use  . Smoking status: Current Every Day Smoker    Packs/day: 0.50    Types: Cigarettes    Last attempt  to quit: 01/27/2014    Years since quitting: 3.6  . Smokeless tobacco: Never Used  Substance Use Topics  . Alcohol use: No  . Drug use: No     Allergies   Patient has no known allergies.   Review of Systems Review of Systems  Constitutional: Negative for chills and fever.  HENT: Negative for congestion and rhinorrhea.   Eyes: Negative for redness and visual disturbance.  Respiratory: Negative for shortness of breath and wheezing.   Cardiovascular: Negative for chest pain and palpitations.  Gastrointestinal: Negative for nausea and vomiting.  Genitourinary: Negative for dysuria and urgency.  Musculoskeletal: Negative for arthralgias and myalgias.  Skin: Negative for pallor and wound.  Neurological: Positive for headaches. Negative for dizziness.     Physical Exam Updated Vital Signs BP (!) 167/114   Pulse 91  Temp 97.9 F (36.6 C) (Oral)   Resp 16   Ht 5\' 3"  (1.6 m)   Wt 44.5 kg (98 lb)   SpO2 99%   BMI 17.36 kg/m   Physical Exam  Constitutional: She is oriented to person, place, and time. She appears well-developed and well-nourished. No distress.  HENT:  Head: Normocephalic and atraumatic.  Eyes: EOM are normal. Pupils are equal, round, and reactive to light.  Neck: Normal range of motion. Neck supple.  Cardiovascular: Normal rate and regular rhythm. Exam reveals no gallop and no friction rub.  No murmur heard. Pulmonary/Chest: Effort normal. She has no wheezes. She has no rales.  Abdominal: Soft. She exhibits no distension. There is no tenderness.  Musculoskeletal: She exhibits no edema or tenderness.  Neurological: She is alert and oriented to person, place, and time. She has normal strength. No cranial nerve deficit or sensory deficit. Coordination normal. She displays no Babinski's sign on the right side. She displays no Babinski's sign on the left side.  Skin: Skin is warm and dry. She is not diaphoretic.  Psychiatric: She has a normal mood and affect. Her  behavior is normal.  Nursing note and vitals reviewed.    ED Treatments / Results  Labs (all labs ordered are listed, but only abnormal results are displayed) Labs Reviewed - No data to display  EKG  EKG Interpretation  Date/Time:  Monday September 26 2017 02:01:57 EST Ventricular Rate:  80 PR Interval:  126 QRS Duration: 84 QT Interval:  394 QTC Calculation: 454 R Axis:   78 Text Interpretation:  Normal sinus rhythm Nonspecific T wave abnormality Abnormal ECG No significant change since last tracing Confirmed by Deno Etienne [phone]) on 09/26/2017 4:11:42 AM       Radiology No results found.  Procedures Procedures (including critical care time)  Medications Ordered in ED Medications  dexamethasone (DECADRON) injection 10 mg (not administered)  prochlorperazine (COMPAZINE) injection 10 mg (10 mg Intravenous Given 09/26/17 0448)  diphenhydrAMINE (BENADRYL) injection 12.5 mg (12.5 mg Intravenous Given 09/26/17 0448)     Initial Impression / Assessment and Plan / ED Course  I have reviewed the triage vital signs and the nursing notes.  Pertinent labs & imaging results that were available during my care of the patient were reviewed by me and considered in my medical decision making (see chart for details).     76 yo F with a chief complaint of a headache.  This feels like her prior.  Will treat with a headache cocktail.  Reassess.  Headache resolved.  Normal neurologic exam for me.  Requesting discharge home.  As the patient has had a few visits to the ED recently for migraine headaches I will refer her to a neurologist.  5:30 AM:  I have discussed the diagnosis/risks/treatment options with the patient and believe the pt to be eligible for discharge home to follow-up with Neuro, PCP. We also discussed returning to the ED immediately if new or worsening sx occur. We discussed the sx which are most concerning (e.g., sudden worsening pain, fever, inability to tolerate by mouth,  stroke s/sx) that necessitate immediate return. Medications administered to the patient during their visit and any new prescriptions provided to the patient are listed below.  Medications given during this visit Medications  dexamethasone (DECADRON) injection 10 mg (not administered)  prochlorperazine (COMPAZINE) injection 10 mg (10 mg Intravenous Given 09/26/17 0448)  diphenhydrAMINE (BENADRYL) injection 12.5 mg (12.5 mg Intravenous Given 09/26/17 0448)  The patient appears reasonably screen and/or stabilized for discharge and I doubt any other medical condition or other [facility] requiring further screening, evaluation, or treatment in the ED at this time prior to discharge.    Final Clinical Impressions(s) / ED Diagnoses   Final diagnoses:  Migraine without aura and with status migrainosus, not intractable    ED Discharge Orders        Ordered    Ambulatory referral to Neurology    Comments:  Recurrence of chronic headaches   09/26/17 0528       Deno Etienne, DO 09/26/17 0530

## 2017-09-26 NOTE — ED Notes (Signed)
Delay for treatment room explained to pt.  Pt remains in triage room.

## 2017-09-26 NOTE — ED Notes (Signed)
Pt given a warm blanket and heat pack for headache per request.  Updated on wait for treatment room.

## 2017-09-29 ENCOUNTER — Encounter: Payer: Self-pay | Admitting: Neurology

## 2017-10-14 ENCOUNTER — Encounter (HOSPITAL_COMMUNITY): Payer: Self-pay | Admitting: Family Medicine

## 2017-10-14 ENCOUNTER — Ambulatory Visit (HOSPITAL_COMMUNITY)
Admission: EM | Admit: 2017-10-14 | Discharge: 2017-10-14 | Disposition: A | Discharge status: Home / Self Care | Payer: Medicare HMO | Attending: Internal Medicine | Admitting: Internal Medicine

## 2017-10-14 DIAGNOSIS — R110 Nausea: Secondary | ICD-10-CM | POA: Diagnosis not present

## 2017-10-14 DIAGNOSIS — G43009 Migraine without aura, not intractable, without status migrainosus: Secondary | ICD-10-CM

## 2017-10-14 MED ORDER — ONDANSETRON HCL 4 MG/2ML IJ SOLN
4.0000 mg | Freq: Once | INTRAMUSCULAR | Status: AC
  Administered 2017-10-14: 4 mg via INTRAMUSCULAR

## 2017-10-14 MED ORDER — DIPHENHYDRAMINE HCL 50 MG/ML IJ SOLN
INTRAMUSCULAR | Status: AC
  Filled 2017-10-14: qty 1

## 2017-10-14 MED ORDER — DEXAMETHASONE SODIUM PHOSPHATE 10 MG/ML IJ SOLN
10.0000 mg | Freq: Once | INTRAMUSCULAR | Status: AC
  Administered 2017-10-14: 10 mg via INTRAMUSCULAR

## 2017-10-14 MED ORDER — ONDANSETRON HCL 4 MG/2ML IJ SOLN
INTRAMUSCULAR | Status: AC
  Filled 2017-10-14: qty 2

## 2017-10-14 MED ORDER — DIPHENHYDRAMINE HCL 50 MG/ML IJ SOLN
12.5000 mg | Freq: Once | INTRAMUSCULAR | Status: AC
  Administered 2017-10-14: 12.5 mg via INTRAVENOUS

## 2017-10-14 MED ORDER — DEXAMETHASONE SODIUM PHOSPHATE 10 MG/ML IJ SOLN
INTRAMUSCULAR | Status: AC
  Filled 2017-10-14: qty 1

## 2017-10-14 NOTE — ED Triage Notes (Signed)
Pt here for continued headaches over the past few weeks. Taking Excedrin migraines. Pt in acute distress with pain in triage.

## 2017-10-14 NOTE — ED Provider Notes (Signed)
[facility]    CSN: [id number] Arrival date & time: 10/14/17  1134     History   Chief Complaint Chief Complaint  Patient presents with  . Migraine    HPI Alyssa [name] is a 76 y.o. female.   Alyssa [name] presents with complaints of migraine pain. Pain is to the right side of her face, jaw, eye. States feels similar to other migraines she has had. Has been seen and treated in the ER twice in the past month for this. Has an appointment with neurology in February. Took excedrine which did not help. Symptoms started at 0930 this morning. Rates pian 8/10. Originally with dizziness, denies currently. Mild nausea without vomiting. Light worsens the pain. Without neck pain, fevers, disorientation, or head injury. History of hypertension, pvd, aki. She is not on any blood thinners.     ROS per HPI.        Past Medical History:  Diagnosis Date  . Anxiety   . Arthritis   . Headache(784.0)   . Hyperlipidemia   . Hypertension   . Joint pain   . Leg pain   . Peripheral neuropathy   . Peripheral vascular disease [facility])     Patient Active Problem List   Diagnosis Date Noted  . AKI (acute kidney injury) ([place]) 06/23/2017  . Hypertensive urgency 12/28/2016  . Elevated serum creatinine 12/28/2016  . Normocytic anemia 12/28/2016  . Chronic pain syndrome 12/28/2016  . Benzodiazepine withdrawal ([place]) 12/28/2016  . Benzodiazepine withdrawal without complication ([place]) 78/58/8502  . Chronic right shoulder pain 12/06/2016  . Fall 09/17/2016  . Fracture of humeral head, closed, right, initial encounter 09/17/2016  . Rib fractures 09/17/2016  . Multiple falls 09/17/2016  . Severe muscle deconditioning 09/17/2016  . Failure to thrive in adult 09/17/2016  . Melena 04/25/2016  . Acute kidney injury ([place]) 04/25/2016  . Essential hypertension 04/25/2016  . Anxiety state 04/25/2016  . Tobacco abuse 04/25/2016  . Hyperlipidemia 04/25/2016  . Syncope 04/24/2016  . Syncope  and collapse 04/24/2016  . Gait difficulty 01/12/2016  . Foot drop, left 01/12/2016  . Severe recurrent major depression without psychotic features ([place]) 08/28/2015  . Spondylolisthesis at L4-L5 level 07/30/2015  . PVD (peripheral vascular disease) with claudication ([place]) 07/29/2014  . Weakness-Bilateral arm/leg 07/29/2014  . Numbness-left leg 07/29/2014  . Swelling of limb-Legs 07/29/2014  . Atherosclerosis of native arteries of the extremities with intermittent claudication 09/30/2011    Past Surgical History:  Procedure Laterality Date  . ABDOMINAL ANGIOGRAM N/A 10/29/2011   Procedure: ABDOMINAL ANGIOGRAM;  Surgeon: Elam Dutch, MD;  Location: [facility] CATH LAB;  Service: Cardiovascular;  Laterality: N/A;  . BACK SURGERY    . FEMORAL-FEMORAL BYPASS GRAFT  08/31/2010  . JOINT REPLACEMENT Right 2012   Hip  . LOWER EXTREMITY ANGIOGRAPHY  06/14/2017   Procedure: Lower Extremity Angiography;  Surgeon: Adrian Prows, MD;  Location: [place] CV LAB;  Service: Cardiovascular;;  Bilateral limited angio performed  . RENAL ANGIOGRAPHY N/A 06/14/2017   Procedure: Renal Angiography;  Surgeon: Adrian Prows, MD;  Location: [place] CV LAB;  Service: Cardiovascular;  Laterality: N/A;  . TOTAL HIP ARTHROPLASTY     right    OB History    No data available       Home Medications    Prior to Admission medications   Medication Sig Start Date End Date Taking? Authorizing Provider  acetaminophen (TYLENOL) 325 MG tablet Take 2 tablets (650 mg total) by mouth every 6 (  six) hours as needed for mild pain (or Fever >/= 101). 04/26/16   Verlee Monte, MD  amLODipine (NORVASC) 10 MG tablet Take 1 tablet (10 mg total) by mouth daily. 12/29/16   Robbie Lis, MD  citalopram (CELEXA) 40 MG tablet Take 1 tablet (40 mg total) by mouth daily. 08/30/15   Delfin Gant, NP  clonazePAM (KLONOPIN) 1 MG tablet Take 1 tablet (1 mg total) by mouth 2 (two) times daily. Patient taking differently: Take 2 mg by  mouth 2 (two) times daily.  12/28/16   Robbie Lis, MD  cloNIDine (CATAPRES) 0.1 MG tablet Take 1 tablet (0.1 mg total) by mouth 3 (three) times daily. 06/24/17   Patrecia Pour, MD  ergocalciferol (VITAMIN D2) 50000 units capsule Take 50,000 Units by mouth once a week.    [provider]  ferrous sulfate 325 (65 FE) MG tablet Take 1 tablet (325 mg total) by mouth every Monday, Wednesday, and Friday. With breakfast 09/20/16   Florencia Reasons, MD  gabapentin (NEURONTIN) 300 MG capsule Take 1 capsule by mouth 3 (three) times daily. 05/16/17   [provider]  labetalol (NORMODYNE) 300 MG tablet Take 1 tablet by mouth 2 (two) times daily. 06/05/17   [provider]  lithium 300 MG tablet Take 300 mg by mouth daily.    [provider]  Multiple Vitamin (MULTIVITAMIN WITH MINERALS) TABS tablet Take 1 tablet by mouth daily. One a Airline pilot, Historical, MD  rosuvastatin (CRESTOR) 10 MG tablet Take 10 mg by mouth daily.     [provider]  temazepam (RESTORIL) 30 MG capsule Take 30 mg by mouth at bedtime.    [provider]    Family History Family History  Problem Relation Age of Onset  . Heart attack Mother   . Heart disease Mother   . Hyperlipidemia Mother   . Hypertension Mother   . Heart attack Father   . Heart disease Father        Before age 64  . Hyperlipidemia Father   . Hypertension Father   . Cancer Sister   . Aneurysm Brother   . Cancer Brother     Social History Social History   Tobacco Use  . Smoking status: Current Every Day Smoker    Packs/day: 0.50    Types: Cigarettes    Last attempt to quit: 01/27/2014    Years since quitting: 3.7  . Smokeless tobacco: Never Used  Substance Use Topics  . Alcohol use: No  . Drug use: No     Allergies   Patient has no known allergies.   Review of Systems Review of Systems   Physical Exam Triage Vital Signs ED Triage Vitals [10/14/17 1223]  Enc Vitals Group      BP (!) 144/78     Pulse Rate (!) 105     Resp 18     Temp 98.1 F (36.7 C)     Temp src      SpO2 100 %     Weight      Height      Head Circumference      Peak Flow      Pain Score 10     Pain Loc      Pain Edu?      Excl. in [place]?    No data found.  Updated Vital Signs BP (!) 144/78   Pulse (!) 105   Temp 98.1 F (36.7  C)   Resp 18   SpO2 100%   Visual Acuity Right Eye Distance:   Left Eye Distance:   Bilateral Distance:    Right Eye Near:   Left Eye Near:    Bilateral Near:     Physical Exam  Constitutional: She is oriented to person, place, and time. She appears well-developed and well-nourished. She appears distressed.  Cardiovascular: Normal rate, regular rhythm and normal heart sounds.  Pulmonary/Chest: Effort normal and breath sounds normal.  Neurological: She is alert and oriented to person, place, and time. She has normal strength. No cranial nerve deficit or sensory deficit. She displays a negative Romberg sign. GCS eye subscore is 4. GCS verbal subscore is 5. GCS motor subscore is 6.  Skin: Skin is warm and dry.     UC Treatments / Results  Labs (all labs ordered are listed, but only abnormal results are displayed) Labs Reviewed - No data to display  EKG  EKG Interpretation None       Radiology No results found.  Procedures Procedures (including critical care time)  Medications Ordered in UC Medications  ondansetron (ZOFRAN) injection 4 mg (4 mg Intramuscular Given 10/14/17 1308)  diphenhydrAMINE (BENADRYL) injection 12.5 mg (12.5 mg Intravenous Given 10/14/17 1309)  dexamethasone (DECADRON) injection 10 mg (10 mg Intramuscular Given 10/14/17 1310)     Initial Impression / Assessment and Plan / UC Course  I have reviewed the triage vital signs and the nursing notes.  Pertinent labs & imaging results that were available during my care of the patient were reviewed by me and considered in my medical decision making (see chart for  details).     Without acute neurological findings on exam. Patient has been to the ER twice in the past month, she states this pain is similar to those experiences. No head trauma, is not on blood thinners. Patient anxious to discharge, pain 4/10 and tolerable after medications administered here in clinic. Follow up with neurologist and PCP as scheduled. Patient and significant other verbalized understanding and agreeable to plan.    Final Clinical Impressions(s) / UC Diagnoses   Final diagnoses:  Migraine without aura and without status migrainosus, not intractable    ED Discharge Orders    None       Controlled Substance Prescriptions [place] Controlled Substance Registry consulted? Not Applicable   Zigmund Gottron, NP 10/14/17 1353

## 2017-10-26 IMAGING — DX DG HIP (WITH OR WITHOUT PELVIS) 2-3V*L*
3 series · 3 of 3 positions shown · non-contrast
Comparison: None.

CLINICAL DATA: Fall yesterday with hip pain, initial encounter

EXAM:
DG HIP (WITH OR WITHOUT PELVIS) 2-3V LEFT

[pelvis ap]
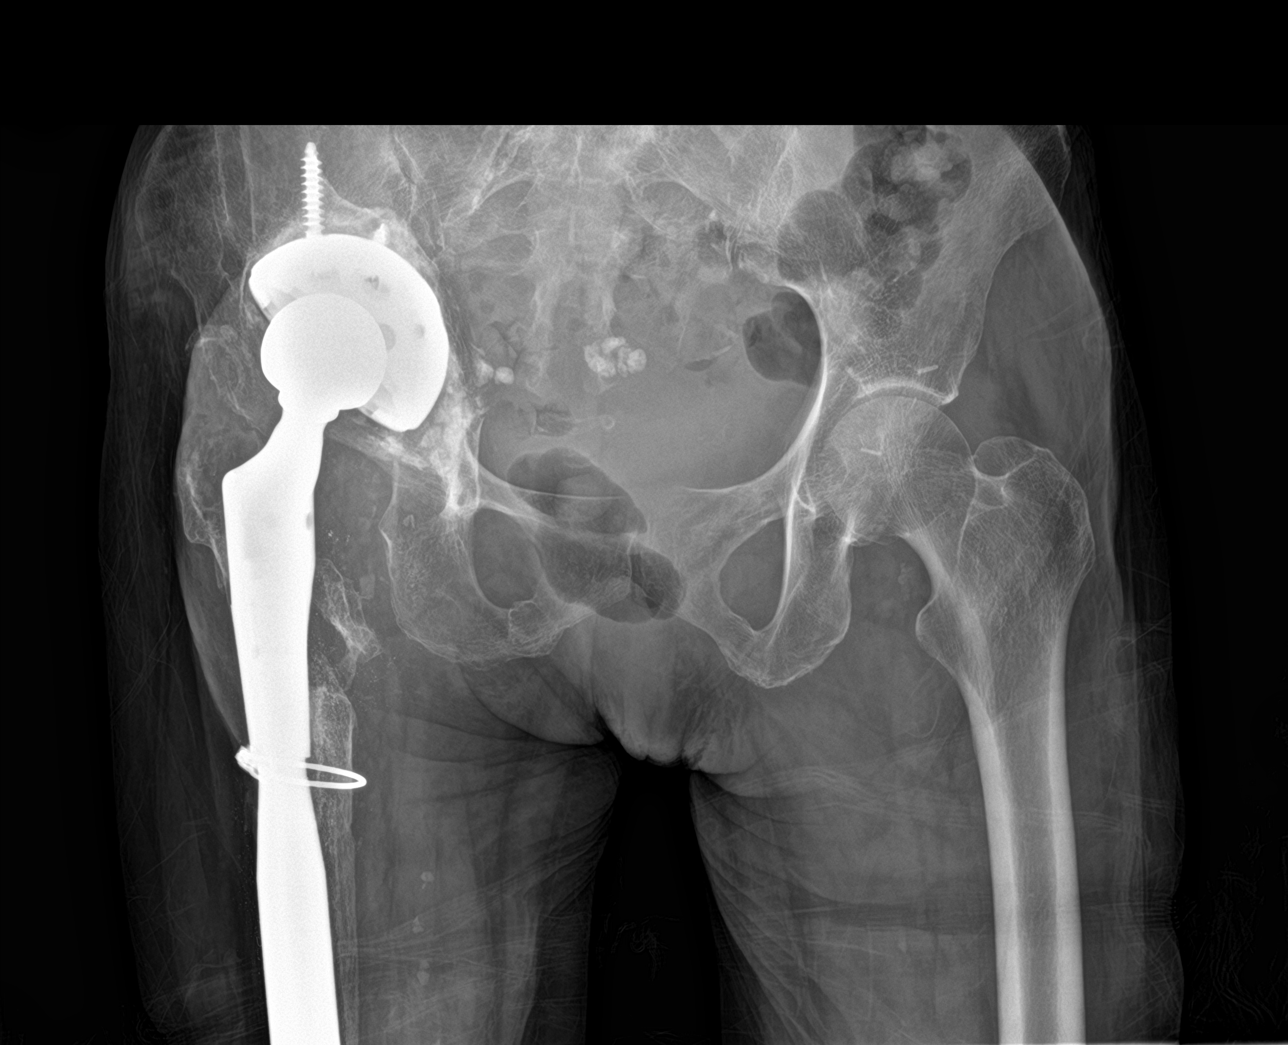

[hip ap]
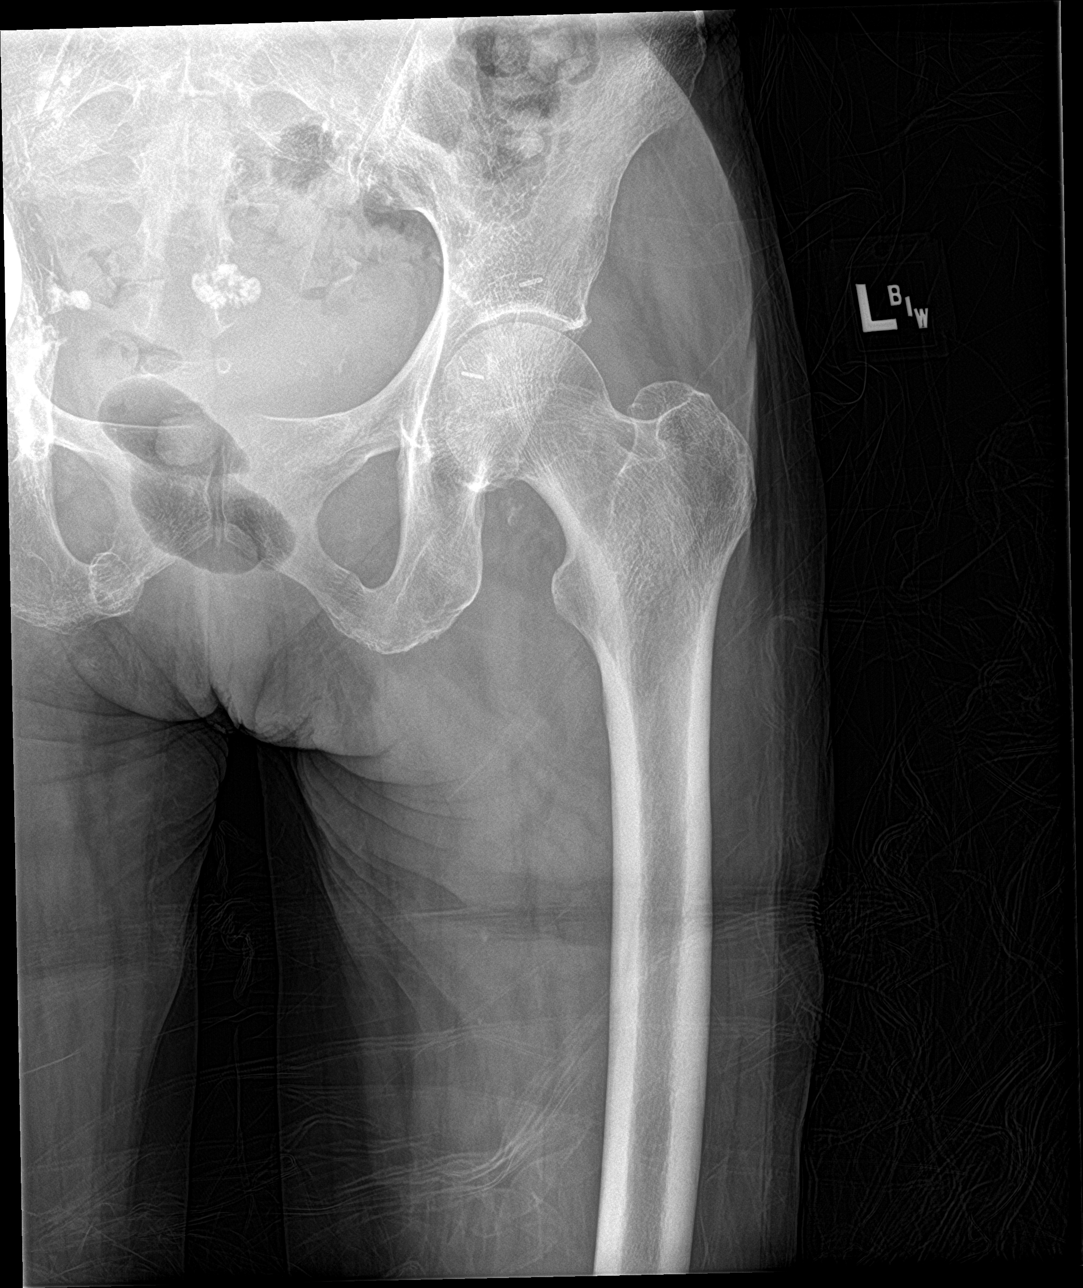

[hip lat]
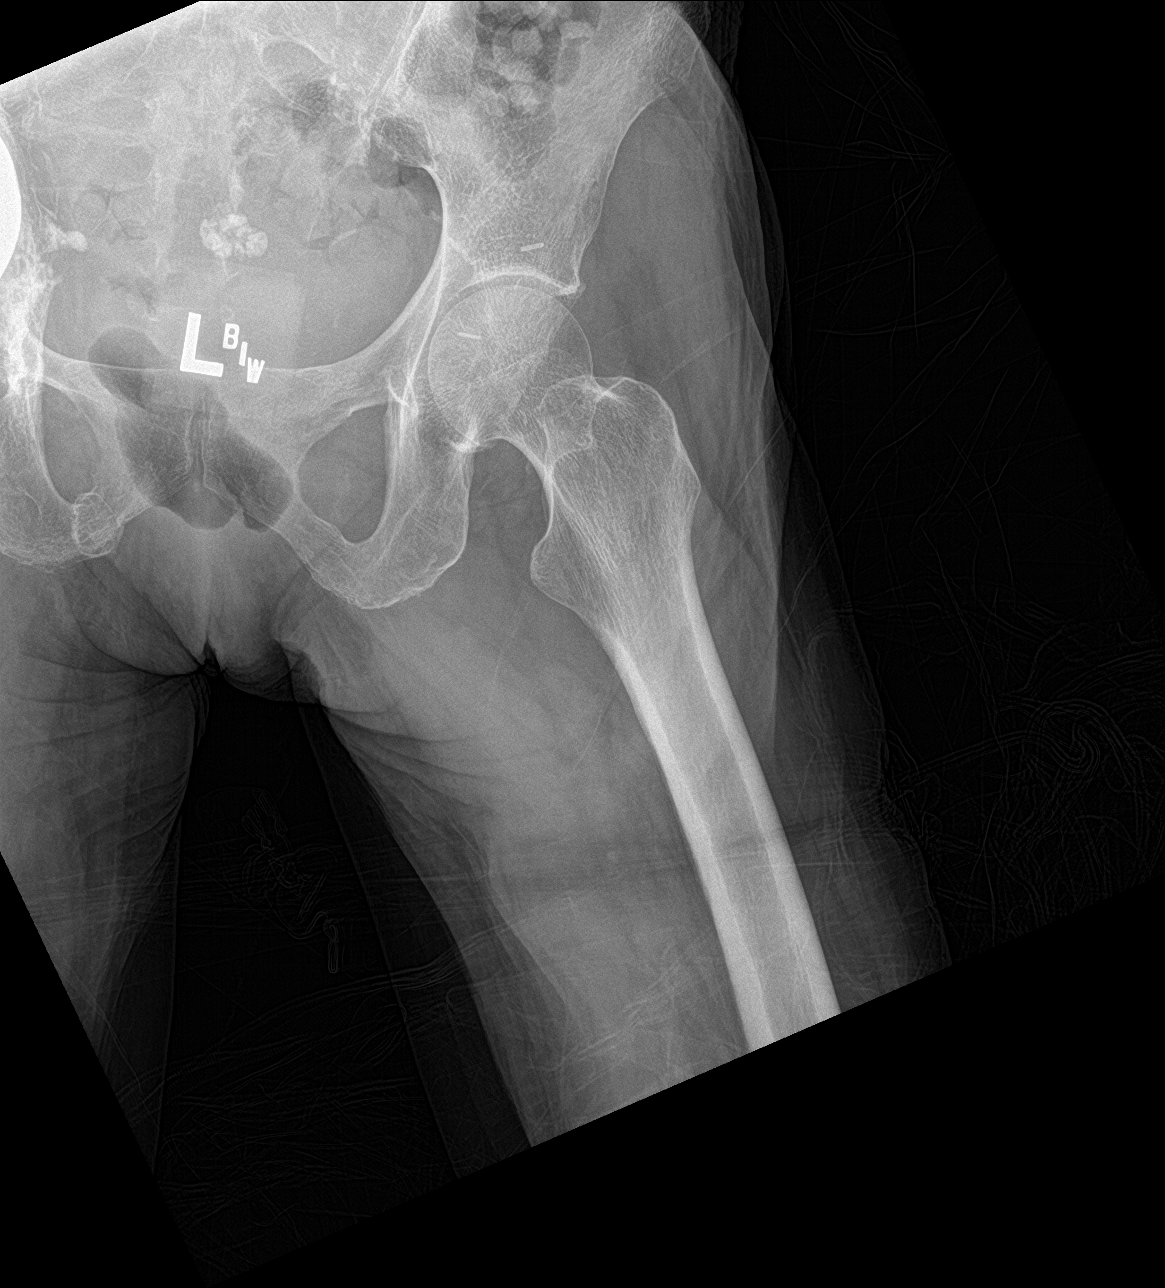

[3 of 3 positions shown; findings below may reference images not displayed]

FINDINGS: Pelvic ring is intact. Right hip replacement is noted with
significant remodeling of the acetabulum. No acute fracture or
dislocation is noted. No soft tissue abnormality is seen.
IMPRESSION: Chronic changes without acute abnormality.

## 2017-10-26 IMAGING — DX DG CHEST 2V
2 series · 2 of 2 positions shown · non-contrast
Comparison: 04/24/2016

CLINICAL DATA: Multiple falls with arm pain and shortness of breath

EXAM:
CHEST  2 VIEW

[chest lat]
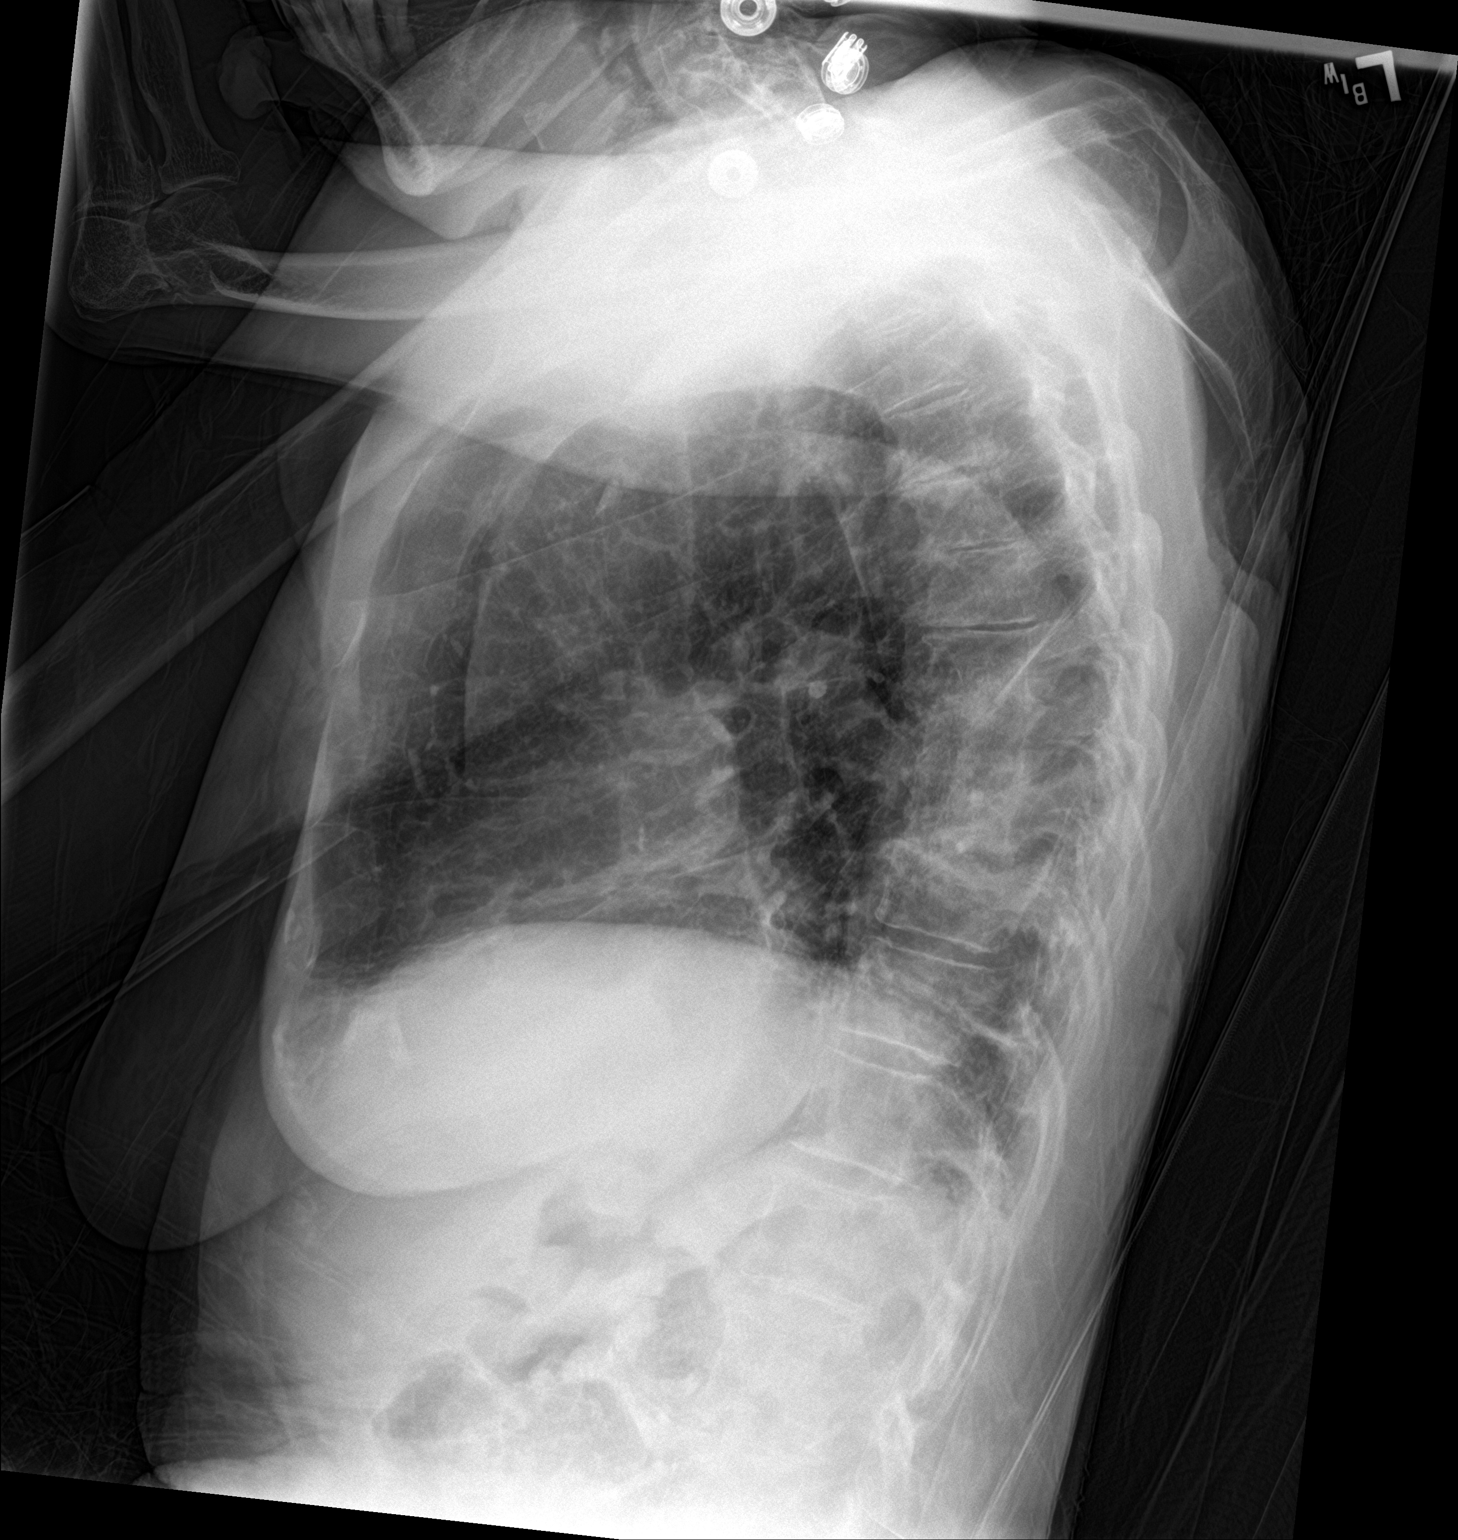

[chest ap]
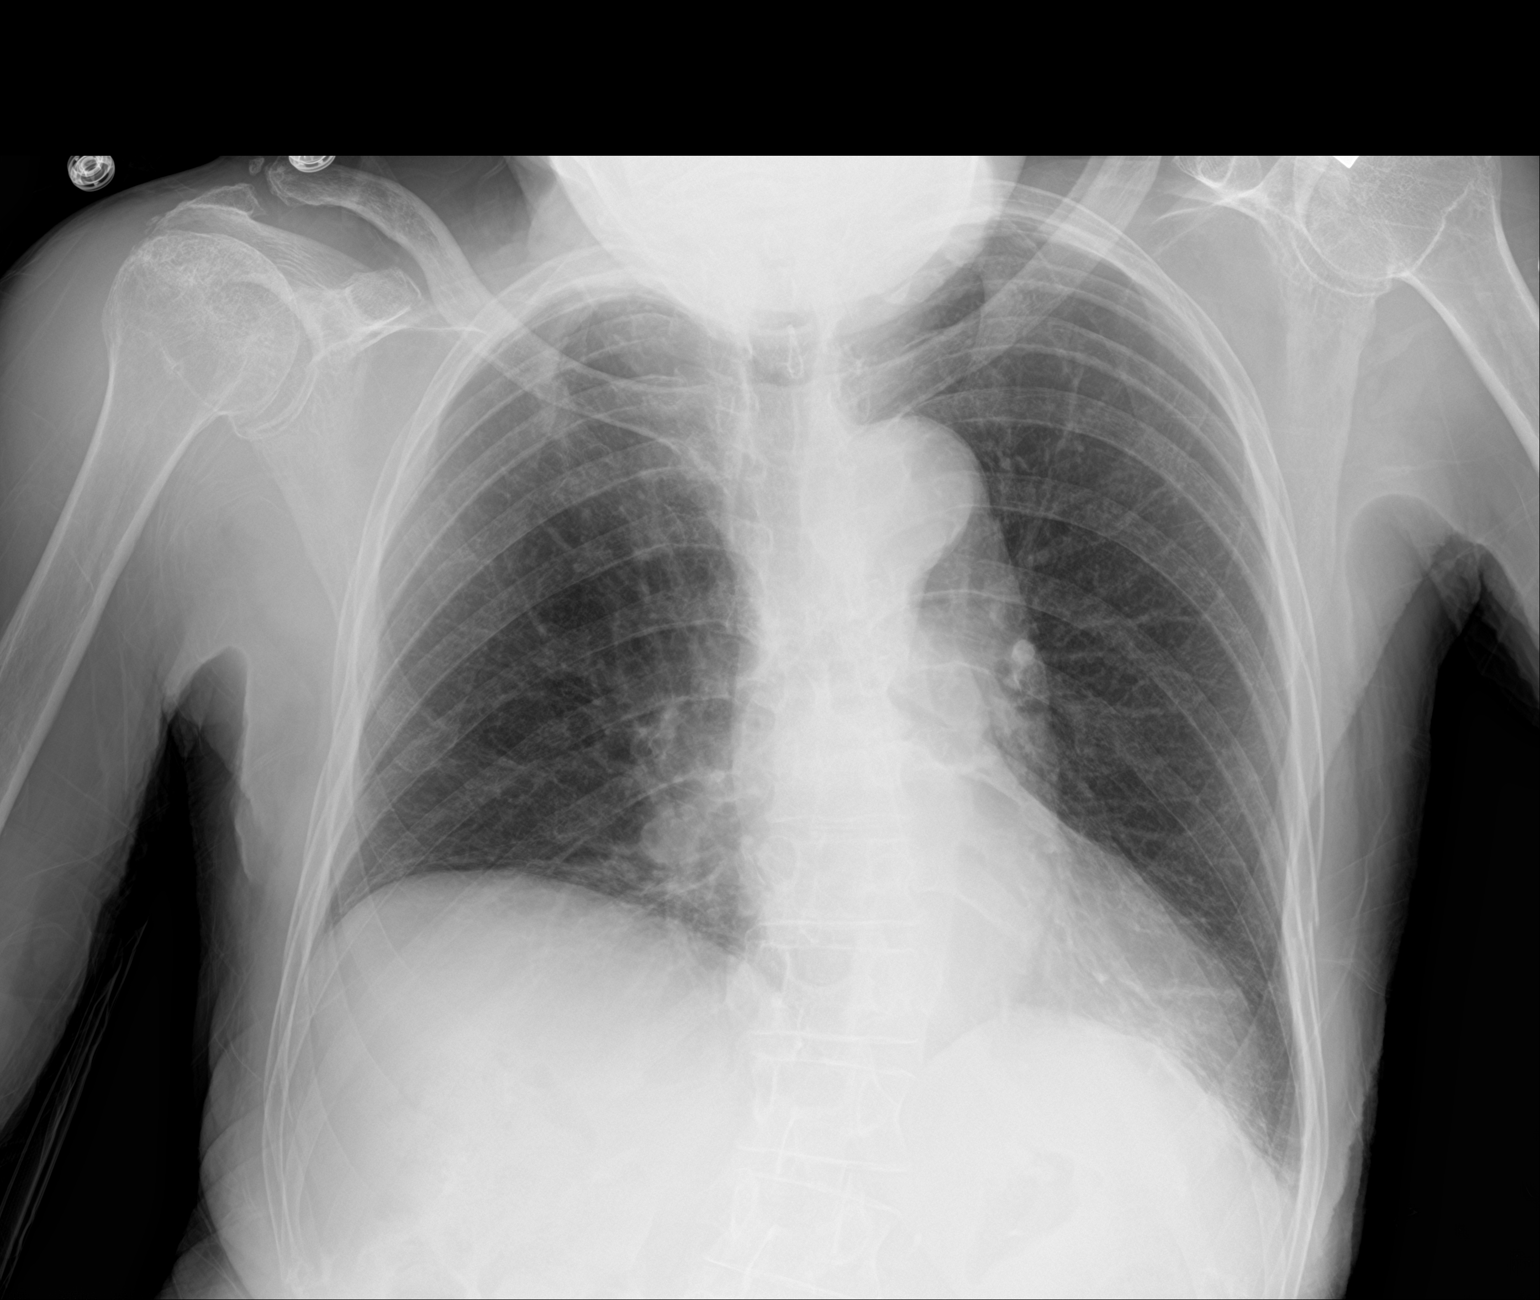

[2 of 2 positions shown; findings below may reference images not displayed]

FINDINGS: Cardiac shadow is within normal limits. The thoracic aorta is
tortuous. The lungs are clear bilaterally. Changes are noted in the
proximal right humerus similar to that seen on recent shoulder film.
Fractures of the seventh and eighth ribs on the left are noted
without pneumothorax. No other focal abnormality is seen.
IMPRESSION: Left rib fractures without complications.

Proximal humeral fracture similar to that seen on recent shoulder
film.

## 2017-10-26 IMAGING — CT CT HEAD W/O CM
3 of 7 series · 13 of 33 positions shown, 15 images · non-contrast
Comparison: MRI of the head April 25, 2016

CLINICAL DATA: Fell yesterday, dizziness and headache. History of
hypertension and hyperlipidemia.

EXAM:
CT HEAD WITHOUT CONTRAST
CT CERVICAL SPINE WITHOUT CONTRAST
TECHNIQUE: Multidetector CT imaging of the head and cervical spine was
performed following the standard protocol without intravenous
contrast. Multiplanar CT image reconstructions of the cervical spine
were also generated.

[Series 4: head 3.0 mpr cor · coronal · 0.30mm/px · 3 of 67 slices shown]
[im 17/67  bone]
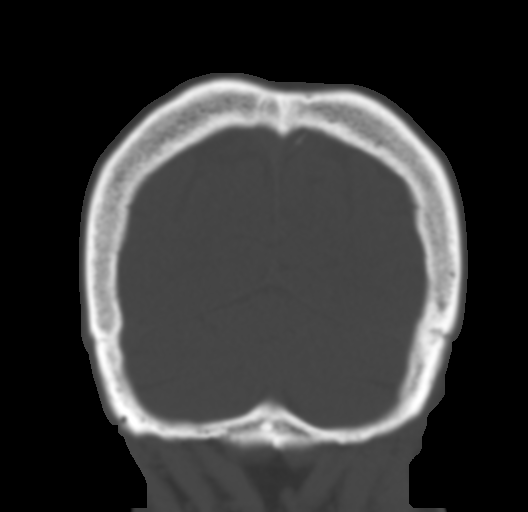
[im 34/67  bone]
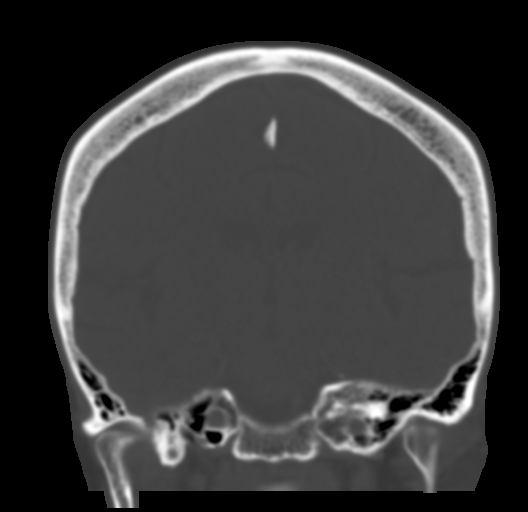
[im 50/67  bone]
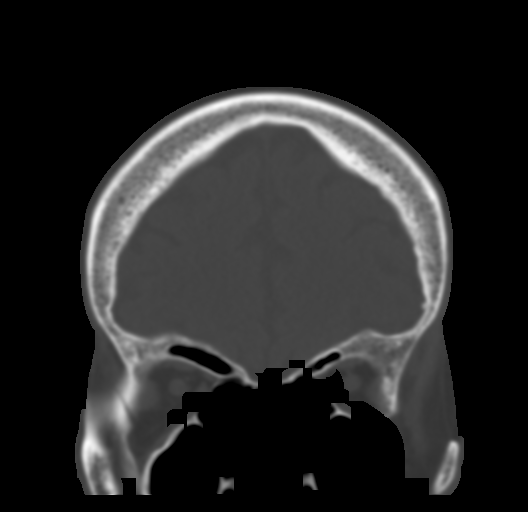

[Series 7: c_spine 2.0 st · axial · 0.29mm/px · z∈[-237,-121]mm · 5 of 88 slices shown, 7 images]
[im 15/88  soft-tissue]
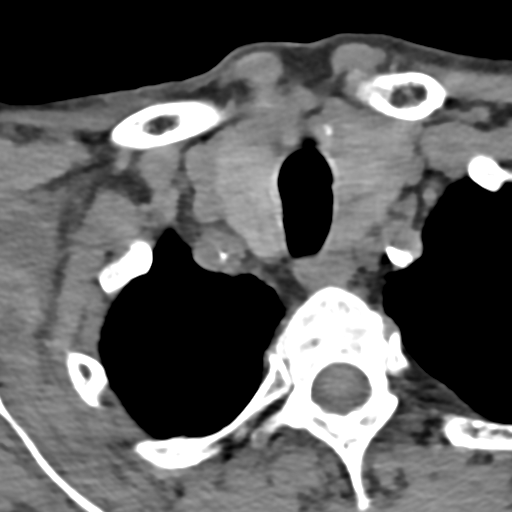
[im 15/88  bone]
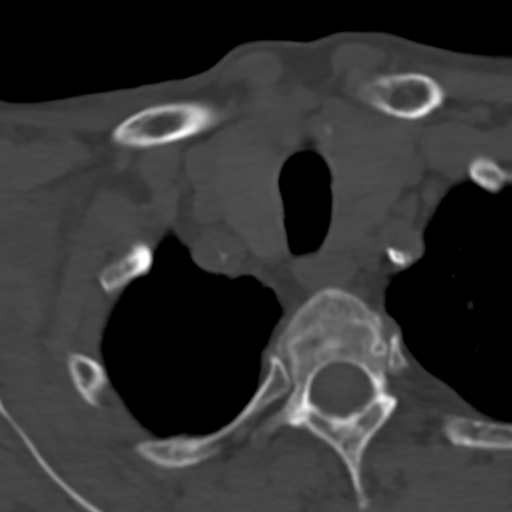
[im 30/88  bone]
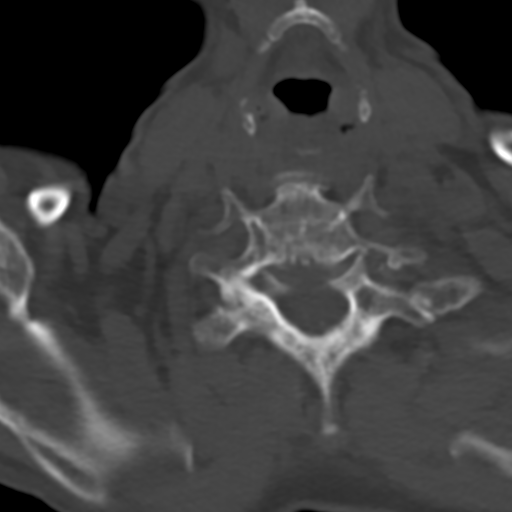
[im 44/88  bone]
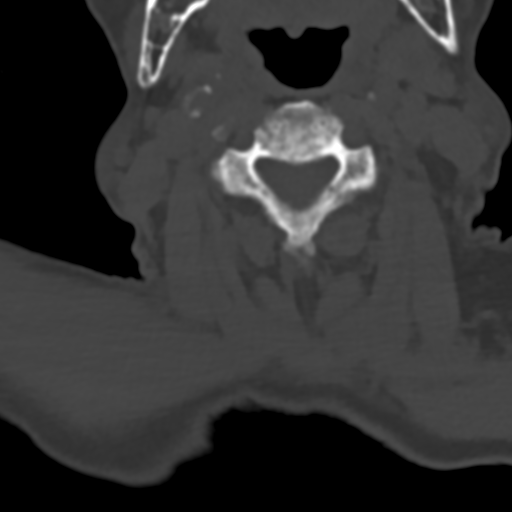
[im 59/88  bone]
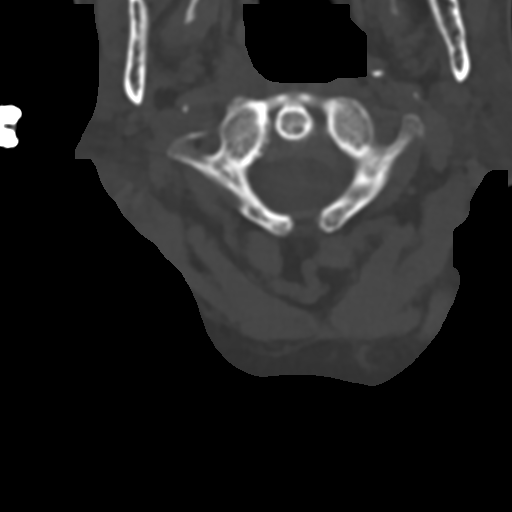
[im 73/88  soft-tissue]
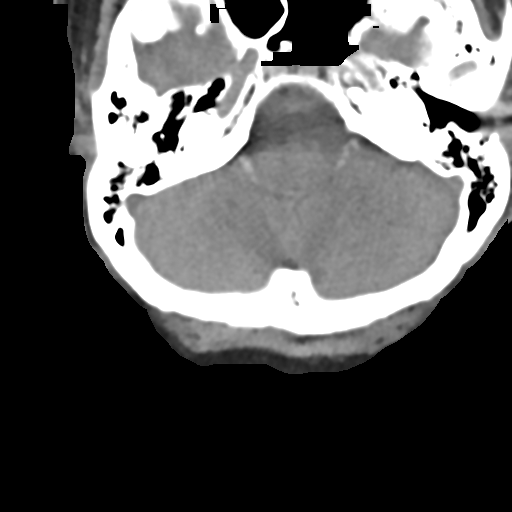
[im 73/88  bone]
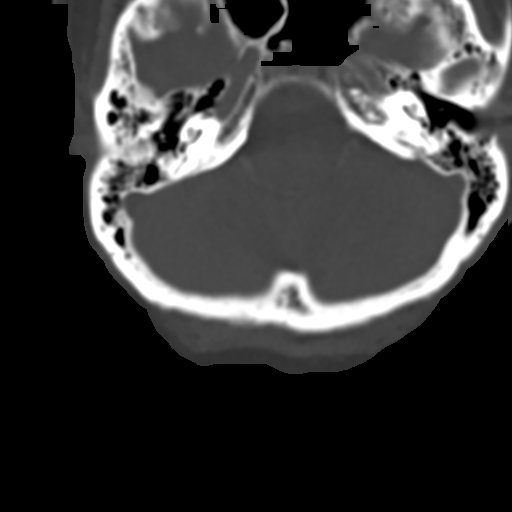

[Series 9: sagittal bone · sagittal · 0.23mm/px · 5 of 61 slices shown]
[im 11/61  bone]
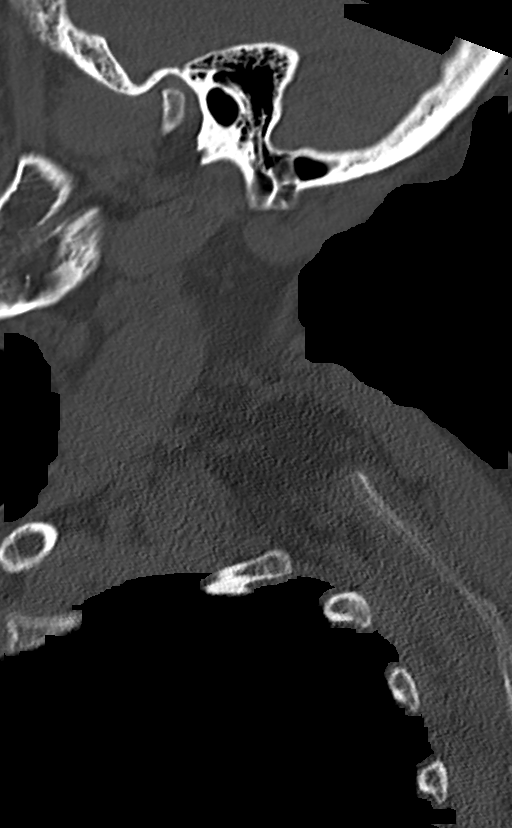
[im 21/61  bone]
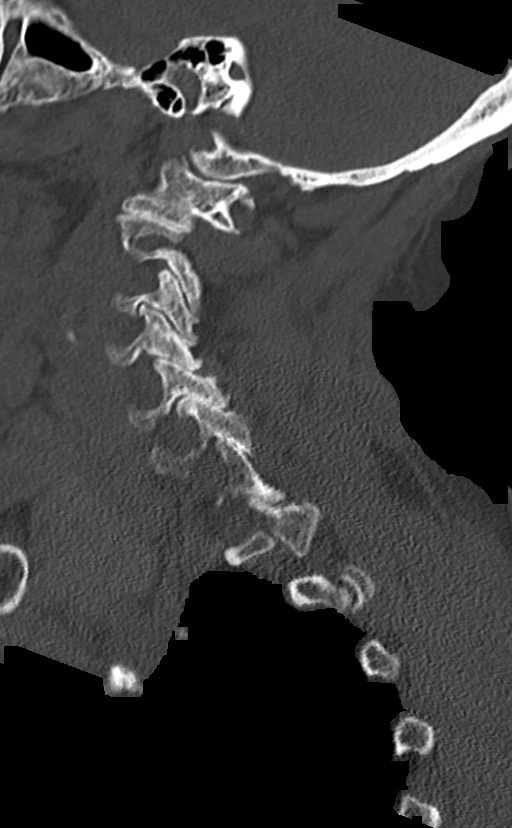
[im 31/61  bone]
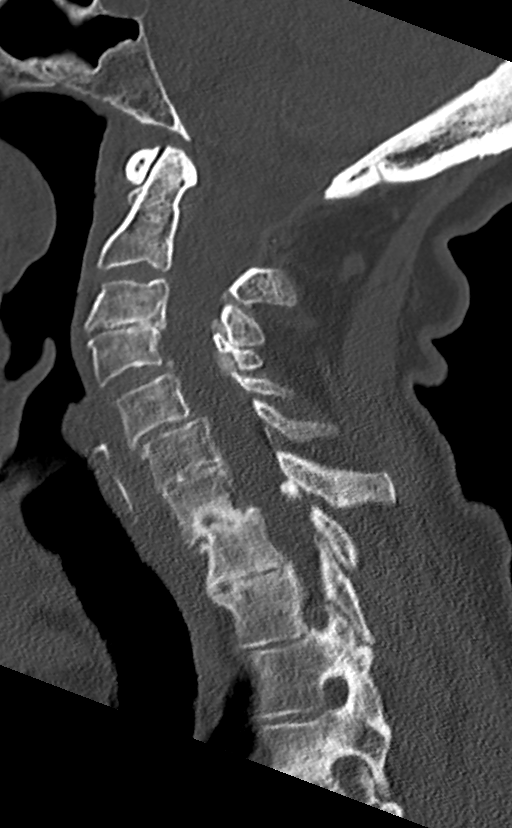
[im 41/61  bone]
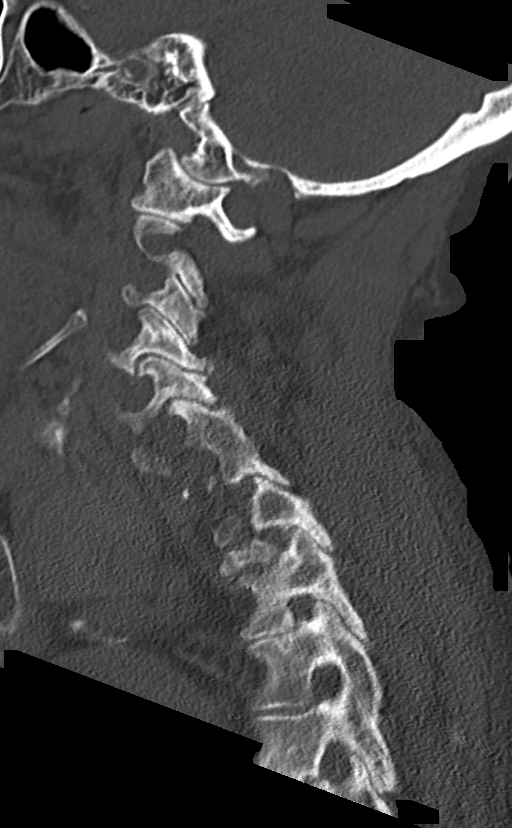
[im 51/61  bone]
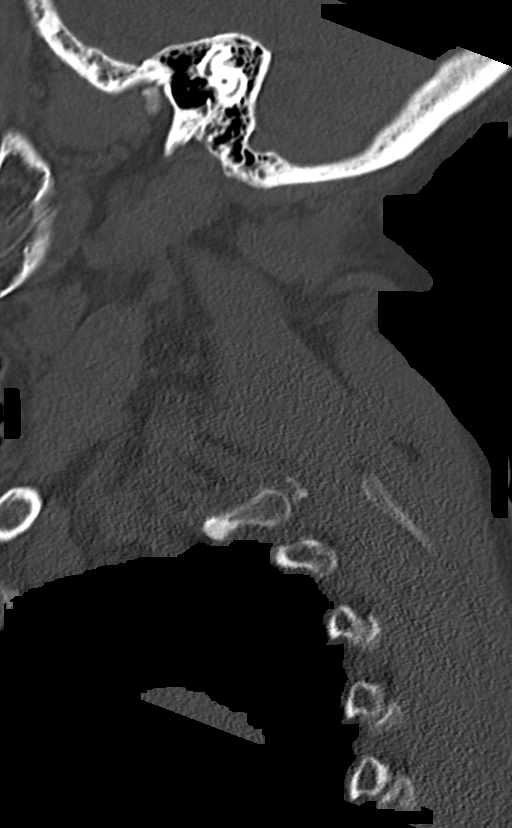

[13 of 33 positions shown; findings below may reference images not displayed]

FINDINGS: CT HEAD FINDINGS

BRAIN: The ventricles and sulci are normal for age. No
intraparenchymal hemorrhage, mass effect nor midline shift. Patchy
supratentorial white matter hypodensities within normal range for
patient's age, though non-specific are most compatible with chronic
small vessel ischemic disease. No acute large vascular territory
infarcts. No abnormal extra-axial fluid collections. Basal cisterns
are patent.

VASCULAR: Moderate calcific atherosclerosis of the carotid siphons.

SKULL: No skull fracture. No significant scalp soft tissue swelling.

SINUSES/ORBITS: Status post fat. Paranasal sinuses are well-aerated.
Mastoid air cells are well aerated. The included ocular globes and
orbital contents are non-suspicious. Status post bilateral ocular
lens implants.

OTHER: None.

CT CERVICAL SPINE FINDINGS

ALIGNMENT: Maintained lordosis.  Grade 1 C7-T1 anterolisthesis.

SKULL BASE AND VERTEBRAE: Cervical vertebral bodies and posterior
elements are intact. Severe C3-4, severe C6-7, C7-T1 and T1-2 disc
height loss with endplate sclerosis and marginal spurring compatible
with degenerative discs. Moderate to severe C5-6 degenerative disc.
Severe facet arthropathy. LEFT C6-7 facets are fused on degenerative
basis. C1-2 articulation maintained with mild arthropathy.

SOFT TISSUES AND SPINAL CANAL: Nonacute. Calcified stylohyoid
ligaments. Moderate calcific atherosclerosis of the carotid
bifurcations.

DISC LEVELS: Moderate canal stenosis C3-4, C7-T1. Severe LEFT C3-4,
moderate to severe bilateral C5-6 and C7-T1 neural foraminal
narrowing.

UPPER CHEST: Severe centrilobular emphysema and included lung
apices.

OTHER: None.
IMPRESSION: CT HEAD: No acute intracranial process.

Stable examination including moderate chronic small vessel ischemic
disease.

CT CERVICAL SPINE: No acute fracture. Grade 1 C7-T1 anterolisthesis
on degenerative basis.

Moderate canal stenosis C3-4 and C7-T1. Severe LEFT C3-4 neural
foraminal narrowing.

## 2017-10-26 IMAGING — DX DG SHOULDER 2+V*R*
3 series · 3 of 3 positions shown · non-contrast
Comparison: Two-view chest x-ray 04/24/2016.

CLINICAL DATA: Fell yesterday. Pain across right shoulder. Pain is
worse with abduction.

EXAM:
RIGHT SHOULDER - 2+ VIEW

[shoulder grashey]
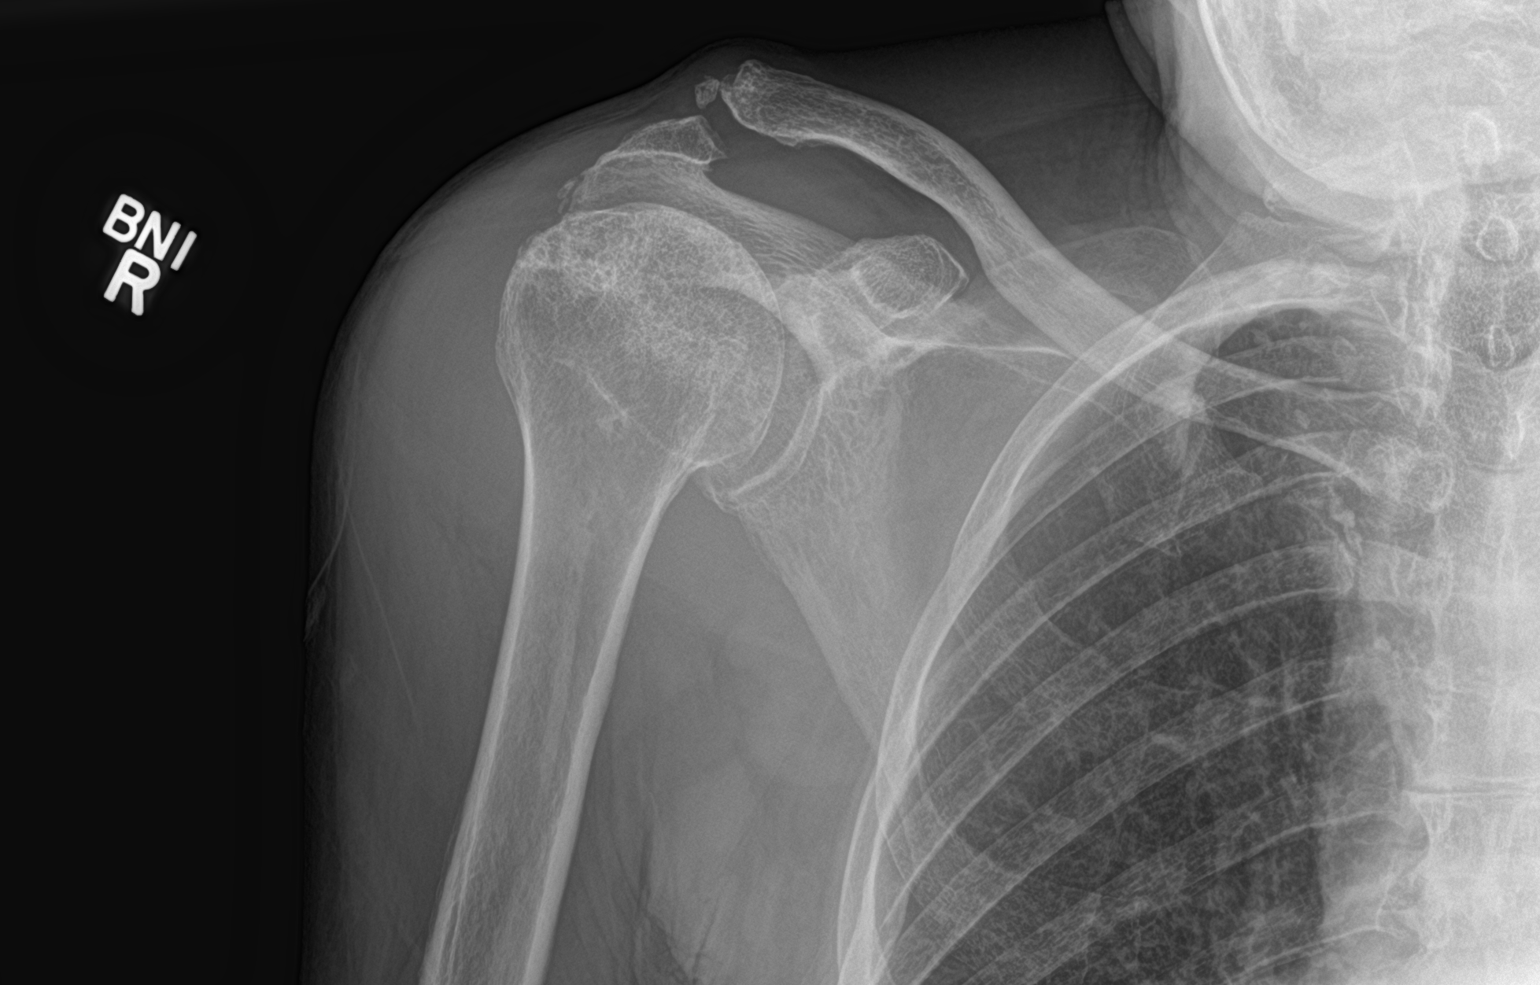

[shoulder y view]
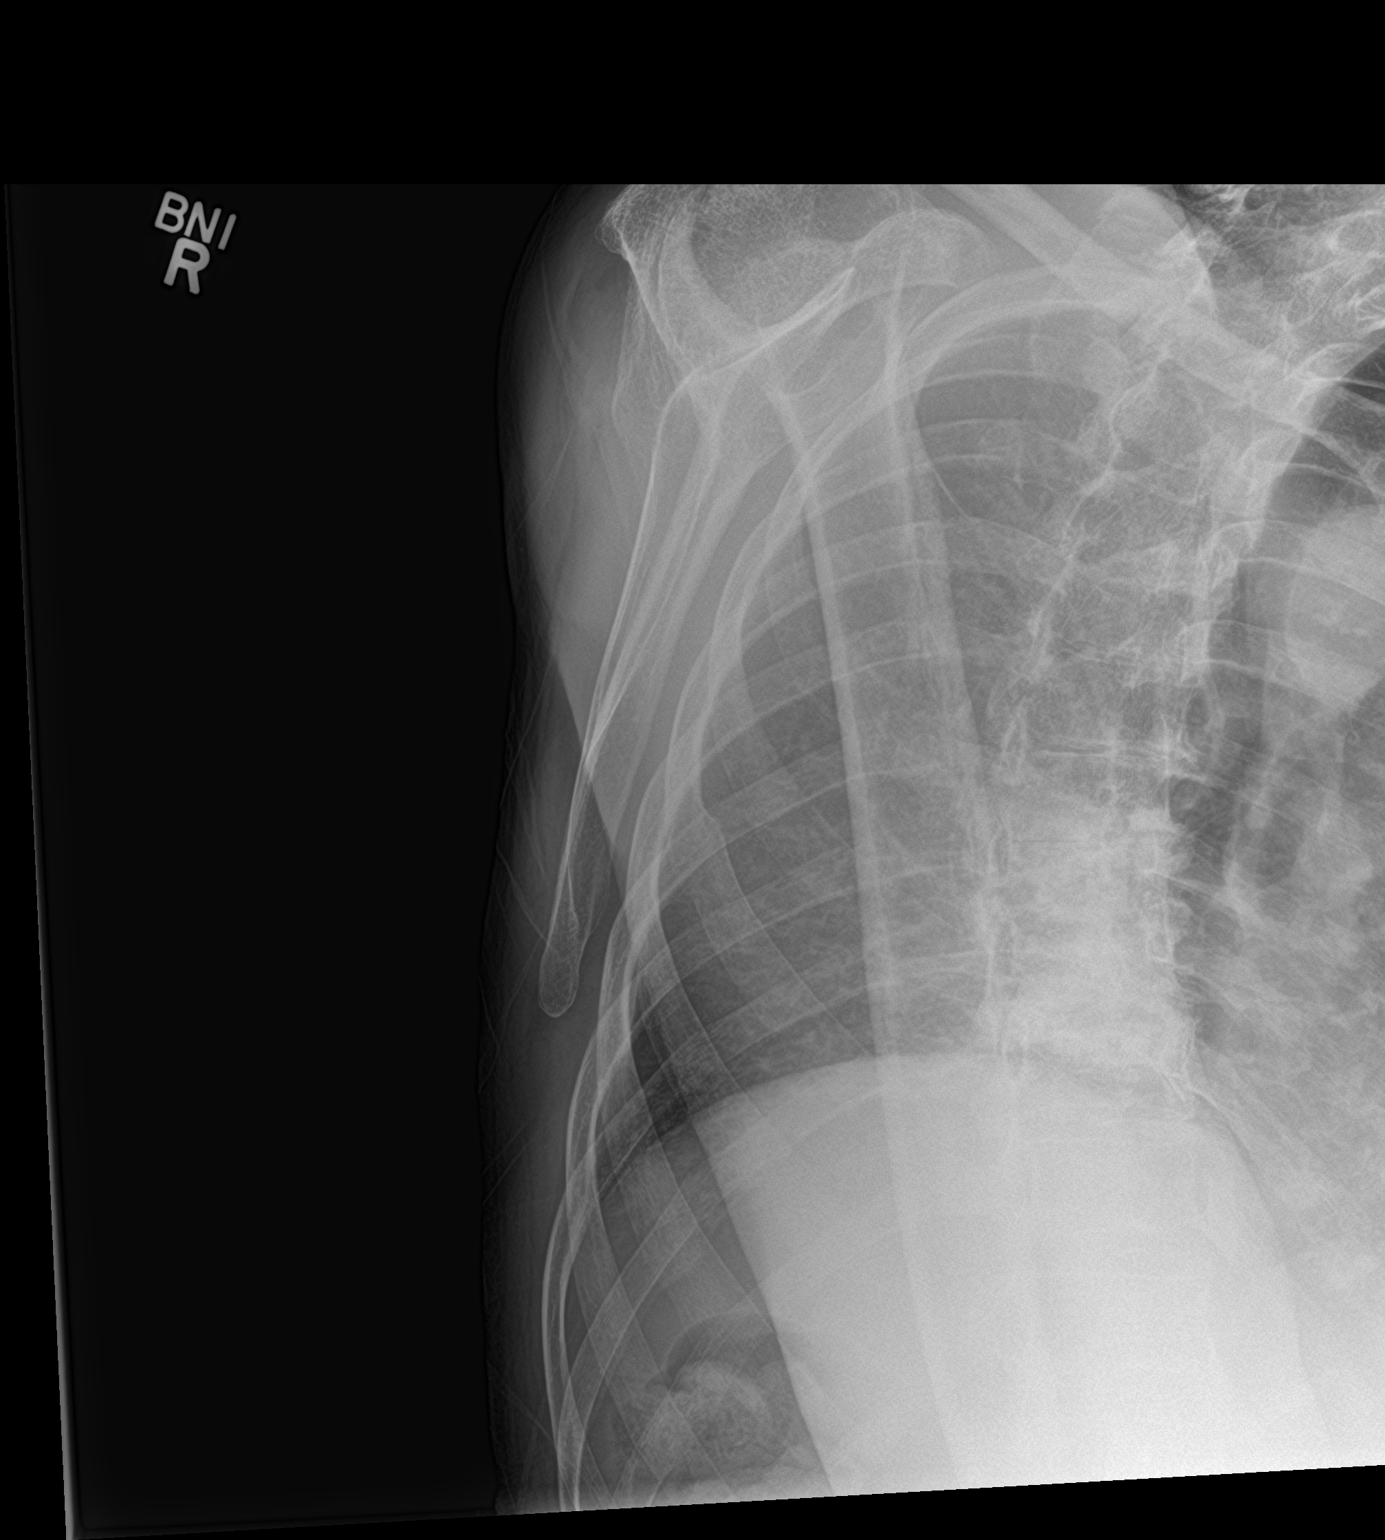

[shoulder axillary]
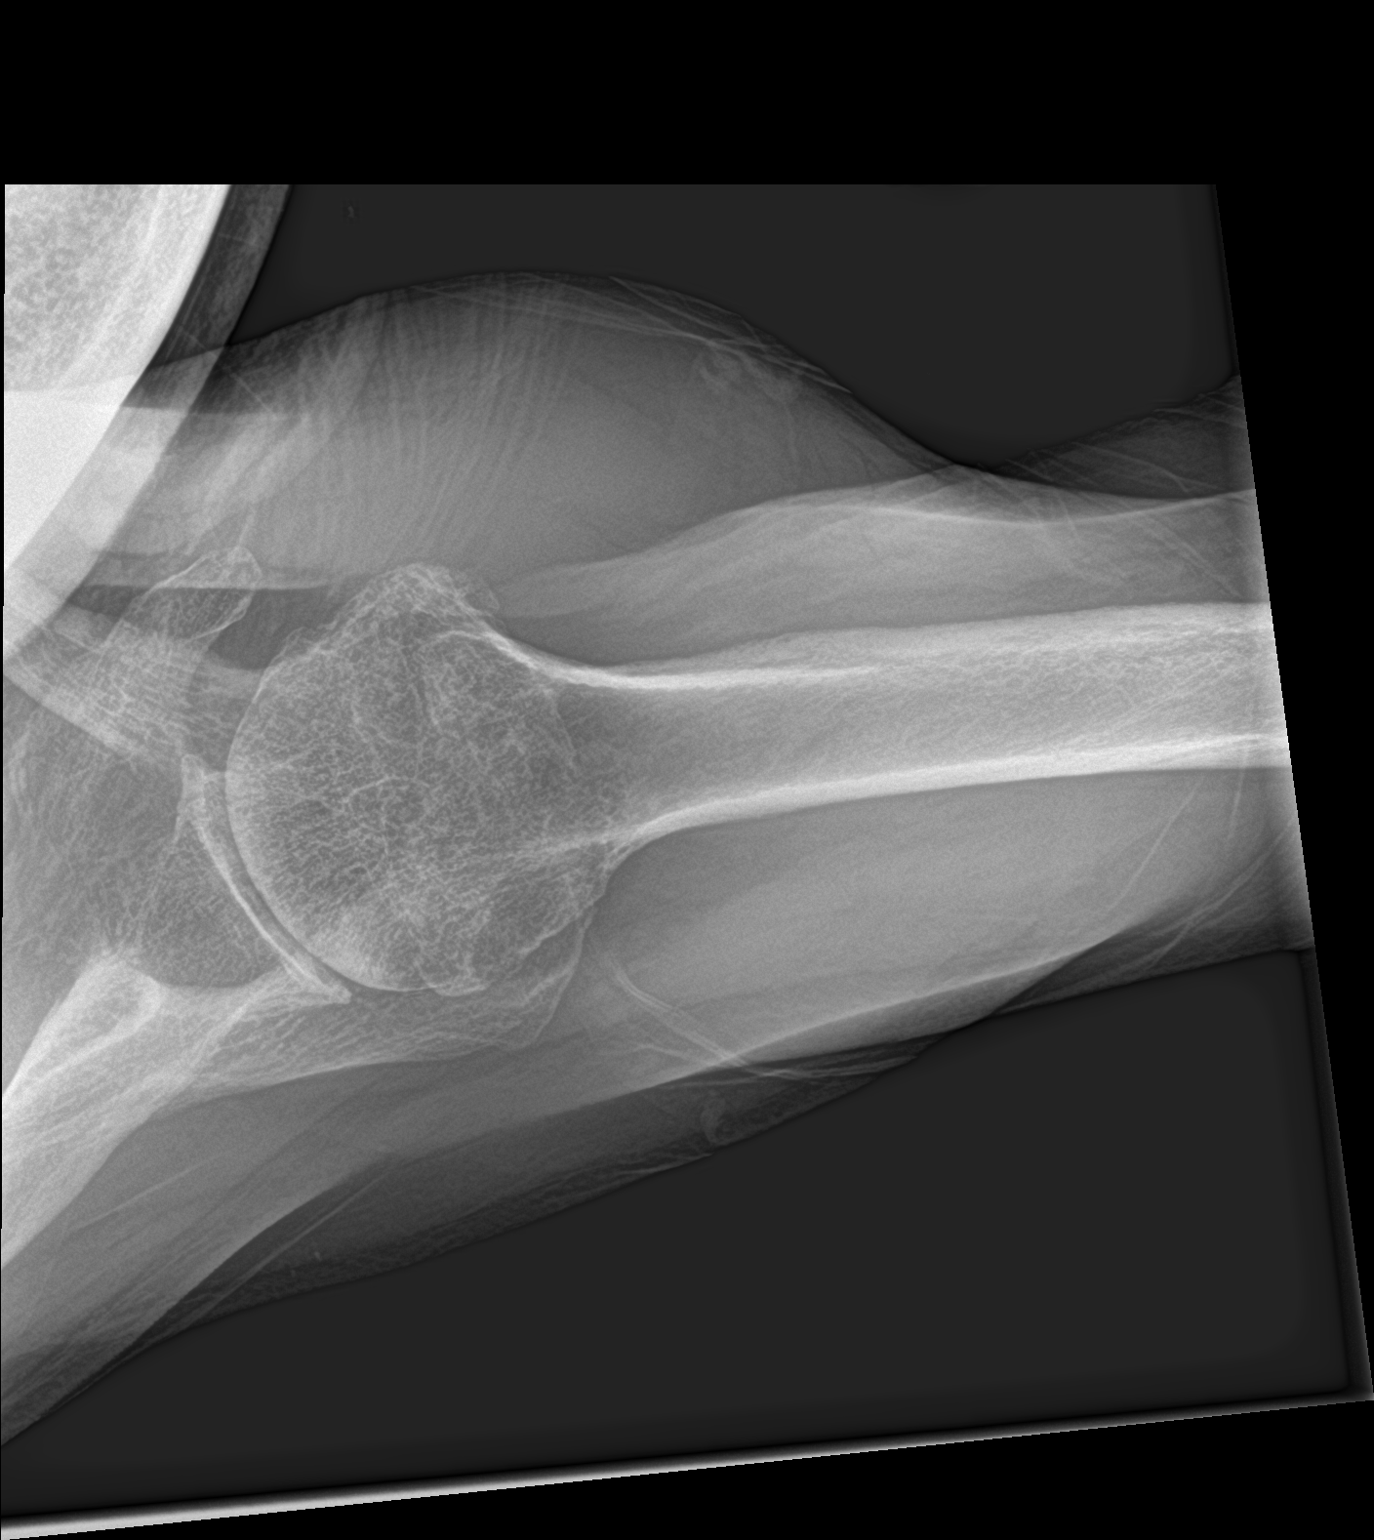

[3 of 3 positions shown; findings below may reference images not displayed]

FINDINGS: A nondisplaced fracture is present within the right humeral head.
The right humerus is located. AC joint is intact. Clavicle is
intact. A remote right fifth rib fracture is again seen. The
visualized right hemi thorax is otherwise clear.
IMPRESSION: 1. Acute/subacute nondisplaced fracture within the right humeral
head.

## 2017-11-25 ENCOUNTER — Encounter: Payer: Self-pay | Admitting: Gastroenterology

## 2017-11-29 ENCOUNTER — Ambulatory Visit (INDEPENDENT_AMBULATORY_CARE_PROVIDER_SITE_OTHER): Payer: Medicare HMO | Admitting: Neurology

## 2017-11-29 ENCOUNTER — Encounter: Payer: Self-pay | Admitting: Neurology

## 2017-11-29 ENCOUNTER — Encounter (INDEPENDENT_AMBULATORY_CARE_PROVIDER_SITE_OTHER): Payer: Self-pay

## 2017-11-29 DIAGNOSIS — G44209 Tension-type headache, unspecified, not intractable: Secondary | ICD-10-CM

## 2017-11-29 MED ORDER — TRAMADOL HCL 50 MG PO TABS: 50.0000 mg | ORAL_TABLET | Freq: Four times a day (QID) | ORAL | 5 refills | Status: DC | PRN
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
## 2017-11-30 ENCOUNTER — Ambulatory Visit: Payer: Medicare HMO | Admitting: Neurology

## 2017-11-30 ENCOUNTER — Telehealth: Payer: Self-pay | Admitting: Neurology

## 2017-11-30 LAB — C-REACTIVE PROTEIN: CRP: 0.7 mg/L (ref 0.0–4.9)

## 2017-11-30 LAB — TSH: TSH: 0.466 u[IU]/mL (ref 0.450–4.500)

## 2017-11-30 LAB — CK: Total CK: 112 U/L (ref 24–173)

## 2017-11-30 LAB — SEDIMENTATION RATE: Sed Rate: 7 mm/hr (ref 0–40)

## 2017-11-30 NOTE — Telephone Encounter (Signed)
Called to make the patient aware that her lab work was all WNL. Pt verbalized understanding. Pt had no questions at this time but was encouraged to call back if questions arise.

## 2017-11-30 NOTE — Telephone Encounter (Signed)
-----   Message from Marcial Pacas, MD sent at 11/30/2017 11:15 AM EST ----- Please call patient for normal laboratory result

## 2017-12-05 ENCOUNTER — Ambulatory Visit
Admission: RE | Admit: 2017-12-05 | Discharge: 2017-12-05 | Disposition: A | Discharge status: Home / Self Care | Payer: Medicare HMO | Source: Ambulatory Visit | Attending: Neurology | Admitting: Neurology

## 2017-12-05 DIAGNOSIS — G44209 Tension-type headache, unspecified, not intractable: Secondary | ICD-10-CM

## 2017-12-06 ENCOUNTER — Encounter: Payer: Self-pay | Admitting: *Deleted

## 2017-12-06 ENCOUNTER — Telehealth: Payer: Self-pay | Admitting: Neurology

## 2017-12-06 NOTE — Telephone Encounter (Signed)
Spoke to patient - she is aware of results and will start aspirin 81mg  daily.

## 2017-12-06 NOTE — Telephone Encounter (Signed)
Please call patient, MRI of the brain showed generalized atrophy supratentorium small vessel disease, all above changes are chronic, she should take baby aspirin daily, I will review MRI films at her next follow-up visit.   IMPRESSION:  This MRI of the brain without contrast shows the following: 1.    T2/FLAIR hyperintense foci in the hemispheres, left basal ganglia and pons in a pattern consistent with moderate chronic microvascular ischemic change, more than expected for age. 2.    Mild generalized cortical atrophy, probably within normal limits for age.  3.    Bilateral paranasal sinus surgery with right ethmoidectomy. There are no significant inflammatory changes on the current exam. 4.    There are no acute findings.

## 2018-01-10 ENCOUNTER — Ambulatory Visit (INDEPENDENT_AMBULATORY_CARE_PROVIDER_SITE_OTHER): Payer: Medicare HMO | Admitting: Gastroenterology

## 2018-01-10 ENCOUNTER — Encounter (INDEPENDENT_AMBULATORY_CARE_PROVIDER_SITE_OTHER): Payer: Self-pay

## 2018-01-10 ENCOUNTER — Encounter: Payer: Self-pay | Admitting: Gastroenterology

## 2018-01-10 VITALS — BP 98/64 | HR 120 | Ht 63.0 in | Wt 96.0 lb

## 2018-01-10 DIAGNOSIS — D509 Iron deficiency anemia, unspecified: Secondary | ICD-10-CM

## 2018-01-10 MED ORDER — NA SULFATE-K SULFATE-MG SULF 17.5-3.13-1.6 GM/177ML PO SOLN: 1.0000 | Freq: Once | ORAL | 0 refills | Status: AC

## 2018-01-10 NOTE — Progress Notes (Signed)
History of Present Illness: This is a 77 year old female referred by Alyssa Agent, MD for the evaluation of iron defieincy anemia. She had CKD stage 3. She was found to be anemic with Hb 9.5, ferritin in the lower normal range at 35 and low iron saturation at 12-15%. Hb not improved on Fe for 2 months. No overt bleeding noted.  She previously underwent incomplete colonoscopy by Dr. Penelope Coop in 2006.  Exam only complete to the sigmoid colon due to a tortuous and fixed sigmoid colon.  A subsequent barium enema showed rectosigmoid diverticulosis with smaller diverticulae scattered throughout the colon.  A polypoid defect near the splenic flexure distal transverse colon could not be excluded the cecum was noted to be somewhat medial in position possibly on a long mesentery.  She has no ongoing gastrointestinal complaints. Denies weight loss, abdominal pain, constipation, diarrhea, change in stool caliber, melena, hematochezia, nausea, vomiting, dysphagia, reflux symptoms, chest pain.    No Known Allergies Outpatient Medications Prior to Visit  Medication Sig Dispense Refill  . acetaminophen (TYLENOL) 500 MG tablet Take 1,000 mg by mouth 2 (two) times daily.    Marland Kitchen amLODipine (NORVASC) 10 MG tablet Take 1 tablet (10 mg total) by mouth daily. 30 tablet 0  . aspirin 81 MG tablet Take 81 mg by mouth daily.    Marland Kitchen aspirin-acetaminophen-caffeine (EXCEDRIN MIGRAINE) 250-250-65 MG tablet Take 1 tablet by mouth as needed for headache.    . citalopram (CELEXA) 40 MG tablet Take 1 tablet (40 mg total) by mouth daily. 30 tablet 0  . clonazePAM (KLONOPIN) 1 MG tablet Take 1 tablet (1 mg total) by mouth 2 (two) times daily. (Patient taking differently: Take 2 mg by mouth 2 (two) times daily. ) 15 tablet 0  . cloNIDine (CATAPRES) 0.1 MG tablet Take 1 tablet (0.1 mg total) by mouth 3 (three) times daily. 90 tablet 0  . ergocalciferol (VITAMIN D2) 50000 units capsule Take 50,000 Units by mouth once a week.    . ferrous  sulfate 325 (65 FE) MG tablet Take 1 tablet (325 mg total) by mouth every Monday, Wednesday, and Friday. With breakfast 30 tablet 0  . gabapentin (NEURONTIN) 300 MG capsule Take 1 capsule by mouth 3 (three) times daily.  0  . labetalol (NORMODYNE) 300 MG tablet Take 1 tablet by mouth 2 (two) times daily.  0  . lithium 300 MG tablet Take 300 mg by mouth daily.    . Multiple Vitamin (MULTIVITAMIN WITH MINERALS) TABS tablet Take 1 tablet by mouth daily. One a Day Women's    . rosuvastatin (CRESTOR) 10 MG tablet Take 10 mg by mouth daily.     . temazepam (RESTORIL) 30 MG capsule Take 30 mg by mouth at bedtime.    . traMADol (ULTRAM) 50 MG tablet Take 1 tablet (50 mg total) by mouth every 6 (six) hours as needed. 30 tablet 5  . cyclobenzaprine (FLEXERIL) 5 MG tablet TK 1 T PO TID FOR 10 DAYS PRN  0  . HYDROcodone-acetaminophen (NORCO/VICODIN) 5-325 MG tablet hydrocodone 5 mg-acetaminophen 325 mg tablet  TAKE 1 TABLET BY MOUTH EVERY 4 HOURS     No facility-administered medications prior to visit.    Past Medical History:  Diagnosis Date  . Anxiety   . Arthritis   . Chronic kidney disease   . Headache(784.0)   . Hyperlipidemia   . Hypertension   . Joint pain   . Leg pain   . Peripheral neuropathy   .  Peripheral vascular disease [facility])    Past Surgical History:  Procedure Laterality Date  . ABDOMINAL ANGIOGRAM N/A 10/29/2011   Procedure: ABDOMINAL ANGIOGRAM;  Surgeon: Elam Dutch, MD;  Location: [facility] CATH LAB;  Service: Cardiovascular;  Laterality: N/A;  . BACK SURGERY    . FEMORAL-FEMORAL BYPASS GRAFT  08/31/2010  . JOINT REPLACEMENT Right 2012   Hip  . LOWER EXTREMITY ANGIOGRAPHY  06/14/2017   Procedure: Lower Extremity Angiography;  Surgeon: Adrian Prows, MD;  Location: [place] CV LAB;  Service: Cardiovascular;;  Bilateral limited angio performed  . RENAL ANGIOGRAPHY N/A 06/14/2017   Procedure: Renal Angiography;  Surgeon: Adrian Prows, MD;  Location: [place] CV LAB;  Service:  Cardiovascular;  Laterality: N/A;  . TOTAL HIP ARTHROPLASTY     right   Social History   Socioeconomic History  . Marital status: Single    Spouse name: Not on file  . Number of children: 2  . Years of education: 11th  . Highest education level: Not on file  Occupational History  . Occupation: Retired  Scientific laboratory technician  . Financial resource strain: Not on file  . Food insecurity:    Worry: Not on file    Inability: Not on file  . Transportation needs:    Medical: Not on file    Non-medical: Not on file  Tobacco Use  . Smoking status: Current Every Day Smoker    Packs/day: 0.50    Types: Cigarettes  . Smokeless tobacco: Never Used  Substance and Sexual Activity  . Alcohol use: No  . Drug use: No  . Sexual activity: Not on file  Lifestyle  . Physical activity:    Days per week: Not on file    Minutes per session: Not on file  . Stress: Not on file  Relationships  . Social connections:    Talks on phone: Not on file    Gets together: Not on file    Attends religious service: Not on file    Active member of club or organization: Not on file    Attends meetings of clubs or organizations: Not on file    Relationship status: Not on file  Other Topics Concern  . Not on file  Social History Narrative    Lives at home with her brother.   Right-handed.   Occasional caffeine use.   Family History  Problem Relation Age of Onset  . Heart attack Mother   . Heart disease Mother   . Hyperlipidemia Mother   . Hypertension Mother   . Heart attack Father   . Heart disease Father        Before age 78  . Hyperlipidemia Father   . Hypertension Father   . Cancer Sister        brain tumor  . Aneurysm Brother        brain  . Colon cancer Neg Hx   . Liver cancer Neg Hx        Review of Systems: Pertinent positive and negative review of systems were noted in the above HPI section. All other review of systems were otherwise negative.    Physical Exam: General: Well  developed, well nourished, no acute distress Head: Normocephalic and atraumatic Eyes:  sclerae anicteric, EOMI Ears: Normal auditory acuity Mouth: No deformity or lesions Neck: Supple, no masses or thyromegaly Lungs: Clear throughout to auscultation Heart: Regular rate and rhythm; no murmurs, rubs or bruits Abdomen: Soft, non tender and non distended. No masses, hepatosplenomegaly or  hernias noted. Normal Bowel sounds Rectal: Deferred to colonoscopy Musculoskeletal: Symmetrical with no gross deformities  Skin: No lesions on visible extremities Pulses:  Normal pulses noted Extremities: No clubbing, cyanosis, edema or deformities noted Neurological: Alert oriented x 4, grossly nonfocal Cervical Nodes:  No significant cervical adenopathy Inguinal Nodes: No significant inguinal adenopathy Psychological:  Alert and cooperative. Normal mood and affect  Assessment and Recommendations:  1.  Anemia, iron deficiency and likely ACD.  Prior incomplete colonoscopy and abnormal barium enema.  Continue iron supplementation.  Rule out gastrointestinal sources of blood loss.  Continue oral iron supplementation.  Hold iron 5 days prior to colonoscopy and EGD.  Schedule colonoscopy and EGD. The risks (including bleeding, perforation, infection, missed lesions, medication reactions and possible hospitalization or surgery if complications occur), benefits, and alternatives to colonoscopy with possible biopsy and possible polypectomy were discussed with the patient and they consent to proceed. The risks (including bleeding, perforation, infection, missed lesions, medication reactions and possible hospitalization or surgery if complications occur), benefits, and alternatives to endoscopy with possible biopsy and possible dilation were discussed with the patient and they consent to proceed.       cc: Alyssa Agent, MD [address] [place], [place] [address]

## 2018-01-10 NOTE — Patient Instructions (Signed)
You have been scheduled for an endoscopy and colonoscopy. Please follow the written instructions given to you at your visit today. Please pick up your prep supplies at the pharmacy within the next 1-3 days. If you use inhalers (even only as needed), please bring them with you on the day of your procedure. Your physician has requested that you go to www.startemmi.com and enter the access code given to you at your visit today. This web site gives a general overview about your procedure. However, you should still follow specific instructions given to you by our office regarding your preparation for the procedure.  You may have a light breakfast the morning of prep day (the day before the procedure).   You may choose from one of the following items: eggs and toast OR chicken noodle soup and crackers.   You should have your breakfast completed between 8:00 and 9:00 am the day before your procedure.    After you have had your light breakfast you should start a clear liquid diet only, NO SOLIDS. No additional solid food is allowed. You may continue to have clear liquid up to 3 hours prior to your procedure.    Normal BMI (Body Mass Index- based on height and weight) is between 23 and 30. Your BMI today is Body mass index is 17.01 kg/m. Marland Kitchen Please consider follow up  regarding your BMI with your Primary Care Provider.  Thank you for choosing me and [facility].  Pricilla Riffle. Dagoberto Ligas., MD., Marval Regal

## 2018-01-18 ENCOUNTER — Ambulatory Visit: Payer: Self-pay | Admitting: Neurology

## 2018-01-19 ENCOUNTER — Ambulatory Visit: Payer: Medicare HMO | Admitting: Neurology

## 2018-01-19 ENCOUNTER — Telehealth: Payer: Self-pay | Admitting: *Deleted

## 2018-01-19 NOTE — Telephone Encounter (Signed)
No showed follow up appointment. 

## 2018-01-20 ENCOUNTER — Encounter: Payer: Self-pay | Admitting: Neurology

## 2018-02-04 IMAGING — CT CT HEAD W/O CM
3 of 4 series · 17 of 47 positions shown, 20 images · non-contrast
Comparison: 09/17/2016 CT of the head

CLINICAL DATA: Headaches and difficulty sleeping.

EXAM:
CT HEAD WITHOUT CONTRAST
TECHNIQUE: Contiguous axial images were obtained from the base of the skull
through the vertex without intravenous contrast.

[Series 201: head w/o, idose (1) · axial · non-contrast · 0.42mm/px · z∈[+189,+319]mm · 11 of 32 slices shown, 14 images]
[im 3/32  brain]
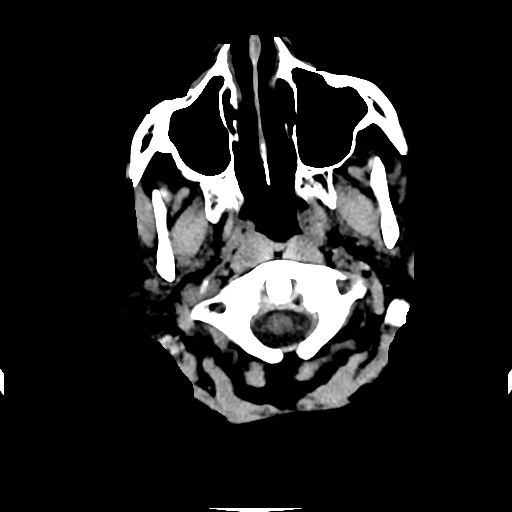
[im 3/32  bone]
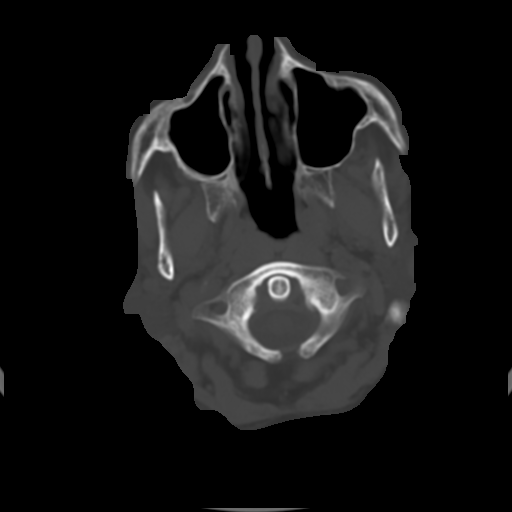
[im 5/32  brain]
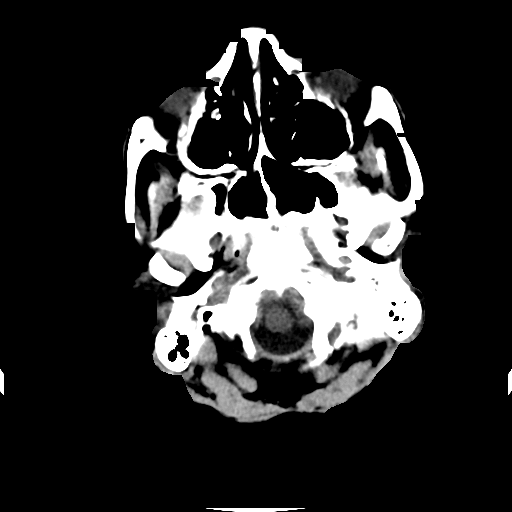
[im 7/32  brain]
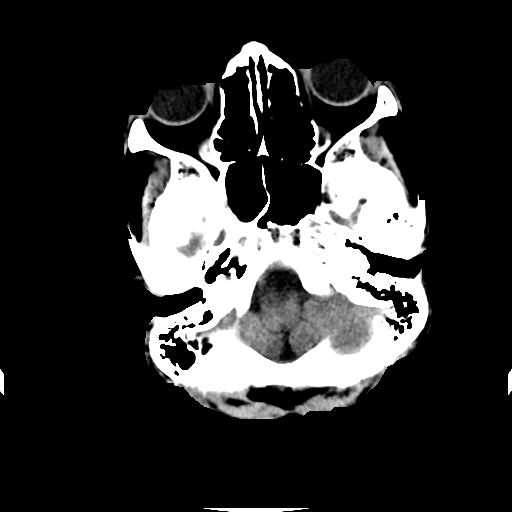
[im 12/32  brain]
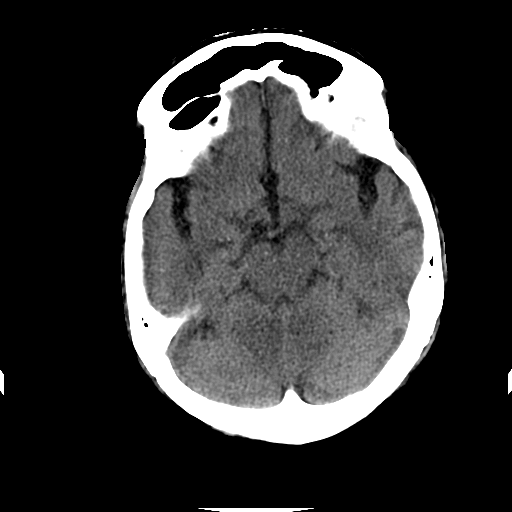
[im 14/32  brain]
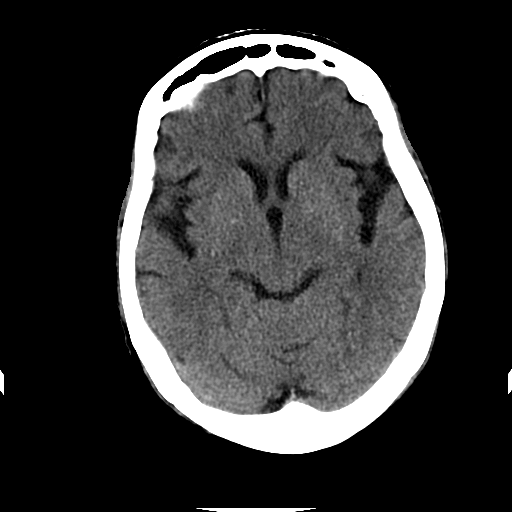
[im 14/32  bone]
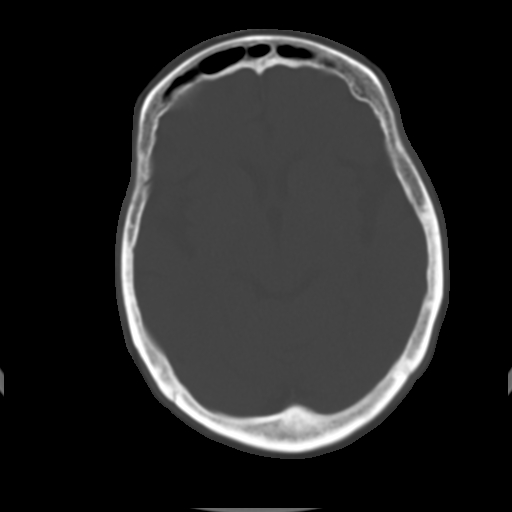
[im 16/32  brain]
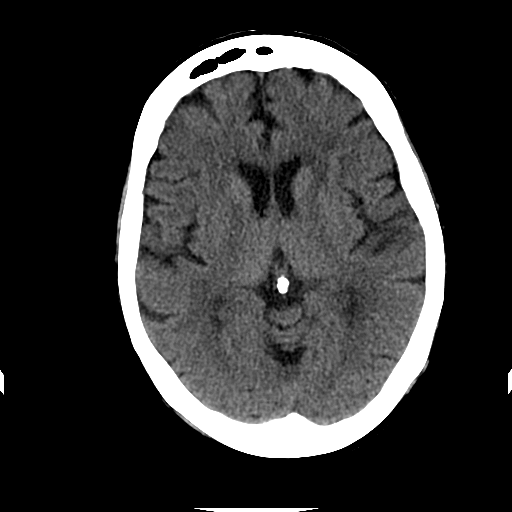
[im 18/32  brain]
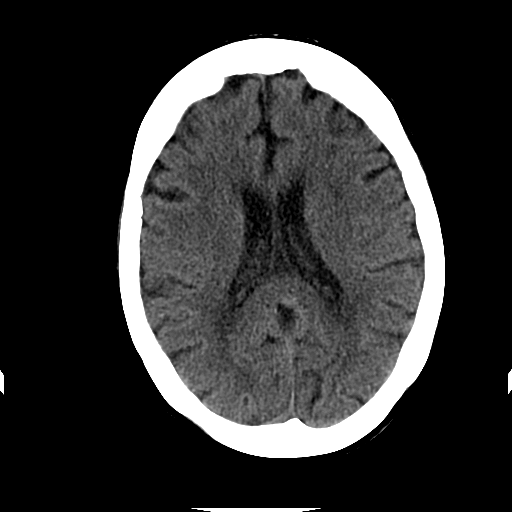
[im 20/32  brain]
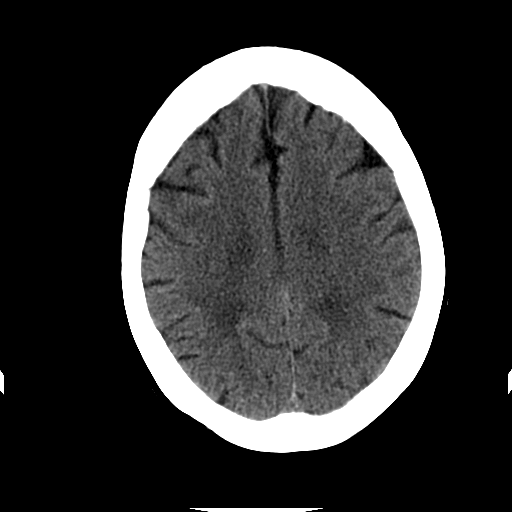
[im 25/32  brain]
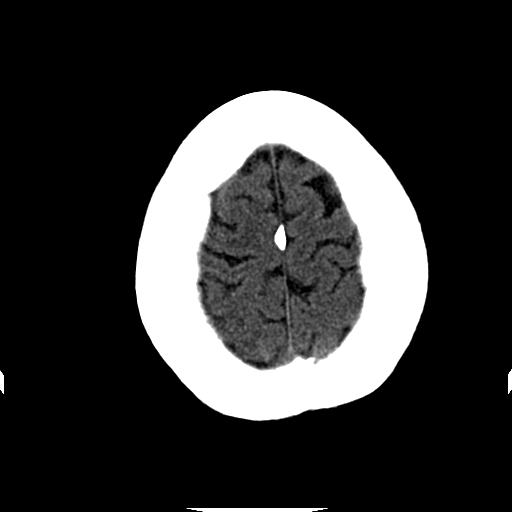
[im 25/32  bone]
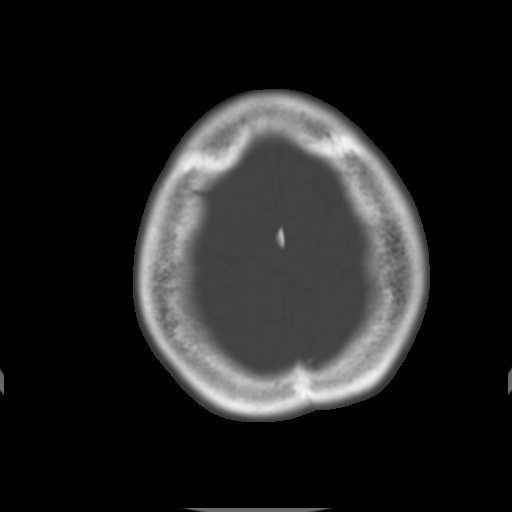
[im 27/32  brain]
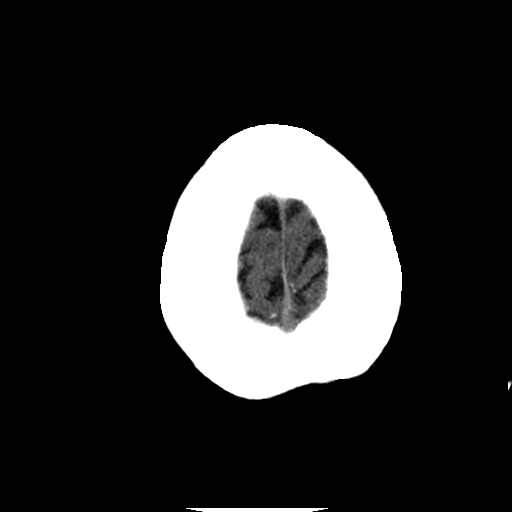
[im 29/32  brain]
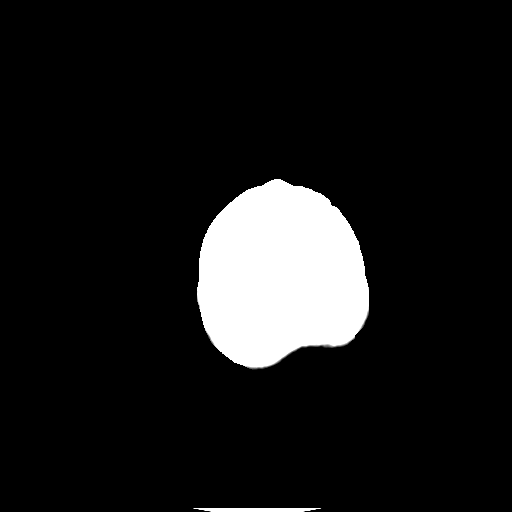

[Series 203: coronal st, idose (1) · coronal · 0.41mm/px · 3 of 69 slices shown]
[im 23/69  brain]
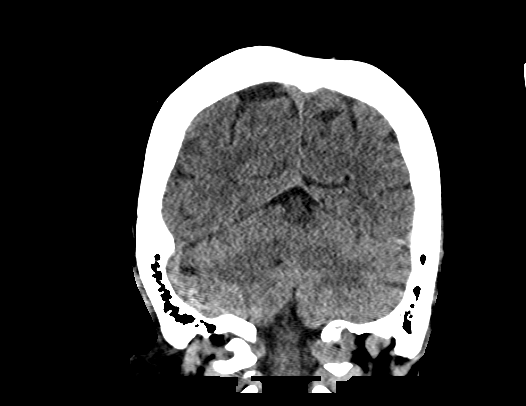
[im 31/69  brain]
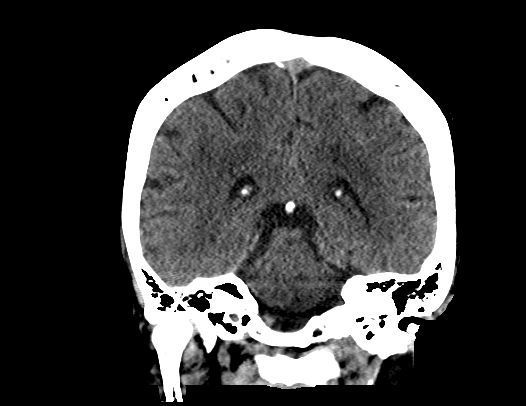
[im 38/69  brain]
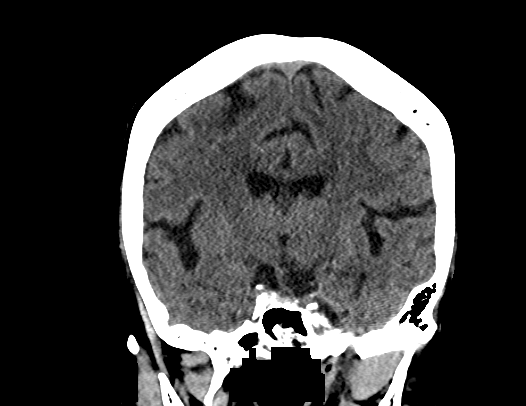

[Series 204: sagittal st, idose (1) · sagittal · 0.40mm/px · 3 of 68 slices shown]
[im 23/68  brain]
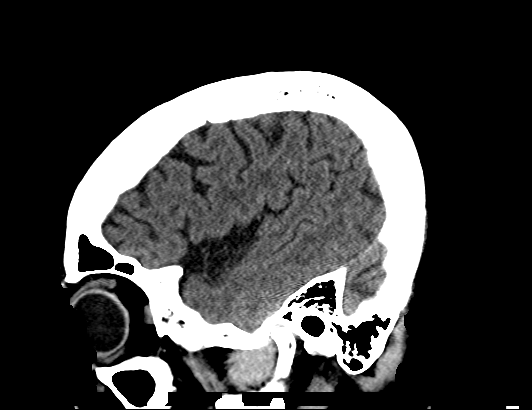
[im 34/68  brain]
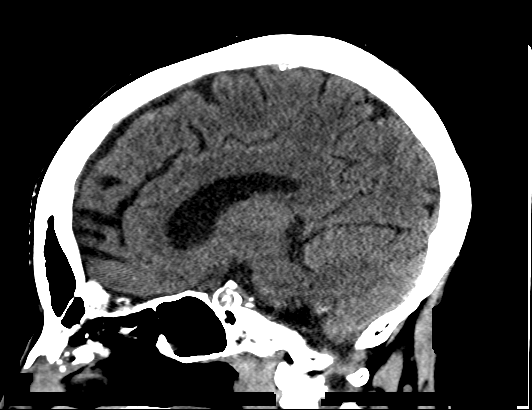
[im 45/68  brain]
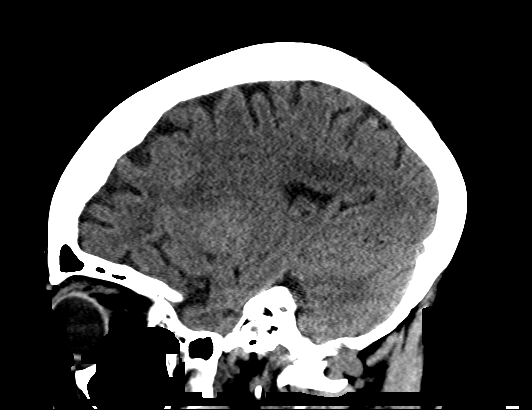

[17 of 47 positions shown; findings below may reference images not displayed]

FINDINGS: Brain: The brainstem, cerebellum, cerebral peduncles, thalami, basal
ganglia, basilar cisterns, and ventricular system appear within
normal limits. Periventricular white matter and corona radiata
hypodensities favor chronic ischemic microvascular white matter
disease. No intracranial hemorrhage, mass lesion, or acute CVA.

Vascular: There is atherosclerotic calcification of the cavernous
carotid arteries bilaterally.

Skull: Unremarkable

Sinuses/Orbits: Partial ethmoidectomies.

Other: Failure fusion of the posterior arch of C1.
IMPRESSION: 1. No acute intracranial findings.
2. Periventricular white matter and corona radiata hypodensities
favor chronic ischemic microvascular white matter disease.

## 2018-02-08 ENCOUNTER — Encounter: Payer: Self-pay | Admitting: Gastroenterology

## 2018-02-08 ENCOUNTER — Other Ambulatory Visit: Payer: Self-pay

## 2018-02-08 ENCOUNTER — Ambulatory Visit (AMBULATORY_SURGERY_CENTER): Payer: Medicare HMO | Admitting: Gastroenterology

## 2018-02-08 VITALS — BP 153/107 | HR 84 | Temp 98.6°F | Resp 15 | Ht 63.0 in | Wt 96.0 lb

## 2018-02-08 DIAGNOSIS — D123 Benign neoplasm of transverse colon: Secondary | ICD-10-CM

## 2018-02-08 DIAGNOSIS — K209 Esophagitis, unspecified without bleeding: Secondary | ICD-10-CM

## 2018-02-08 DIAGNOSIS — D508 Other iron deficiency anemias: Secondary | ICD-10-CM

## 2018-02-08 DIAGNOSIS — K2270 Barrett's esophagus without dysplasia: Secondary | ICD-10-CM | POA: Diagnosis not present

## 2018-02-08 DIAGNOSIS — K449 Diaphragmatic hernia without obstruction or gangrene: Secondary | ICD-10-CM

## 2018-02-08 MED ORDER — OMEPRAZOLE 40 MG PO CPDR: 40.0000 mg | DELAYED_RELEASE_CAPSULE | Freq: Every day | ORAL | 3 refills | Status: DC

## 2018-02-08 MED ORDER — SODIUM CHLORIDE 0.9 % IV SOLN: 500.0000 mL | Freq: Once | INTRAVENOUS | Status: DC

## 2018-02-08 NOTE — Progress Notes (Signed)
Dr. Fuller Plan notified via phone call of Omeprazole warning with patients also taking Celexa.  Dr. Fuller Plan stated that the benefit of the omeprazole outweighed the risk of the celexa warning and said to override it and go ahead and order it through her pharmacy.

## 2018-02-08 NOTE — Progress Notes (Signed)
A and O x3. Report to RN. Tolerated MAC anesthesia well.Teeth unchanged after procedure.

## 2018-02-08 NOTE — Op Note (Signed)
[place] [name] Procedure Date: 02/08/2018 3:30 PM MRN: [id number] Endoscopist: Ladene Artist , MD Age: 77 Referring MD:  Date of Birth: 08/08/1941 Gender: Female Account #: [id number] Procedure:                Upper GI endoscopy Indications:              Iron deficiency anemia Medicines:                Monitored Anesthesia Care Procedure:                Pre-Anesthesia Assessment:                           - Prior to the procedure, a History and Physical                            was performed, and patient medications and                            allergies were reviewed. The patient's tolerance of                            previous anesthesia was also reviewed. The risks                            and benefits of the procedure and the sedation                            options and risks were discussed with the patient.                            All questions were answered, and informed consent                            was obtained. Prior Anticoagulants: The patient has                            taken no previous anticoagulant or antiplatelet                            agents. ASA Grade Assessment: II - A patient with                            mild systemic disease. After reviewing the risks                            and benefits, the patient was deemed in                            satisfactory condition to undergo the procedure.                           After obtaining informed consent, the endoscope was  passed under direct vision. Throughout the                            procedure, the patient's blood pressure, pulse, and                            oxygen saturations were monitored continuously. The                            Model GIF-HQ190 [id number]) scope was introduced                            through the mouth, and advanced to the second part                            of duodenum. The upper  GI endoscopy was                            accomplished without difficulty. The patient                            tolerated the procedure well. Scope In: Scope Out: Findings:                 LA Grade C (one or more mucosal breaks continuous                            between tops of 2 or more mucosal folds, less than                            75% circumference) esophagitis with no bleeding was                            found in the distal esophagus.                           There were esophageal mucosal changes suspicious                            for short-segment Barrett's esophagus present in                            the distal esophagus. The maximum longitudinal                            extent of these mucosal changes was 3 cm in length.                            Mucosa was biopsied with a cold forceps for                            histology. One specimen bottle was sent to  pathology.                           The exam of the esophagus was otherwise normal.                           A medium-sized hiatal hernia was present.                           The exam of the stomach was otherwise normal.                           The duodenal bulb and second portion of the                            duodenum were normal. Complications:            No immediate complications. Estimated Blood Loss:     Estimated blood loss was minimal. Impression:               - LA Grade C reflux esophagitis.                           - Esophageal mucosal changes suspicious for                            short-segment Barrett's esophagus. Biopsied.                           - Medium-sized hiatal hernia.                           - Normal duodenal bulb and second portion of the                            duodenum. Recommendation:           - Patient has a contact number available for                            emergencies. The signs and symptoms of potential                             delayed complications were discussed with the                            patient. Return to normal activities tomorrow.                            Written discharge instructions were provided to the                            patient.                           - Resume previous diet.                           -  Continue present medications.                           - Await pathology results.                           - Prilosec (omeprazole) 40 mg PO daily, 1 year of                            refills.                           - Follow up with PCP as planned. Ladene Artist, MD 02/08/2018 4:30:27 PM This report has been signed electronically.

## 2018-02-08 NOTE — Progress Notes (Signed)
Called to room to assist during endoscopic procedure.  Patient ID and intended procedure confirmed with present staff. Received instructions for my participation in the procedure from the performing physician.  

## 2018-02-08 NOTE — Patient Instructions (Signed)
HANDOUTS GIVEN FOR POLYPS, DIVERTICULOSIS, ESOPHAGITIS AND HIATAL HERNIA  NEW PRESCRIPTION: OMEPRAZOLE 40 MG A DAY IN THE MORNING 30 MINUTES BEFORE ANY FOOD OR DRINK  YOU HAD AN ENDOSCOPIC PROCEDURE TODAY AT [facility]:   Refer to the procedure report that was given to you for any specific questions about what was found during the examination.  If the procedure report does not answer your questions, please call your gastroenterologist to clarify.  If you requested that your care partner not be given the details of your procedure findings, then the procedure report has been included in a sealed envelope for you to review at your convenience later.  YOU SHOULD EXPECT: Some feelings of bloating in the abdomen. Passage of more gas than usual.  Walking can help get rid of the air that was put into your GI tract during the procedure and reduce the bloating. If you had a lower endoscopy (such as a colonoscopy or flexible sigmoidoscopy) you may notice spotting of blood in your stool or on the toilet paper. If you underwent a bowel prep for your procedure, you may not have a normal bowel movement for a few days.  Please Note:  You might notice some irritation and congestion in your nose or some drainage.  This is from the oxygen used during your procedure.  There is no need for concern and it should clear up in a day or so.  SYMPTOMS TO REPORT IMMEDIATELY:   Following lower endoscopy (colonoscopy or flexible sigmoidoscopy):  Excessive amounts of blood in the stool  Significant tenderness or worsening of abdominal pains  Swelling of the abdomen that is new, acute  Fever of 100F or higher   Following upper endoscopy (EGD)  Vomiting of blood or coffee ground material  New chest pain or pain under the shoulder blades  Painful or persistently difficult swallowing  New shortness of breath  Fever of 100F or higher  Black, tarry-looking stools  For urgent or emergent issues, a  gastroenterologist can be reached at any hour by calling [phone].   DIET:  We do recommend a small meal at first, but then you may proceed to your regular diet.  Drink plenty of fluids but you should avoid alcoholic beverages for 24 hours.  ACTIVITY:  You should plan to take it easy for the rest of today and you should NOT DRIVE or use heavy machinery until tomorrow (because of the sedation medicines used during the test).    FOLLOW UP: Our staff will call the number listed on your records the next business day following your procedure to check on you and address any questions or concerns that you may have regarding the information given to you following your procedure. If we do not reach you, we will leave a message.  However, if you are feeling well and you are not experiencing any problems, there is no need to return our call.  We will assume that you have returned to your regular daily activities without incident.  If any biopsies were taken you will be contacted by phone or by letter within the next 1-3 weeks.  Please call us at [phone] if you have not heard about the biopsies in 3 weeks.    SIGNATURES/CONFIDENTIALITY: You and/or your care partner have signed paperwork which will be entered into your electronic medical record.  These signatures attest to the fact that that the information above on your After Visit Summary has been reviewed and is understood.  Full responsibility of the confidentiality of this discharge information lies with you and/or your care-partner.

## 2018-02-08 NOTE — Op Note (Signed)
[facility] Patient Name: Leyla Soliz Procedure Date: 02/08/2018 3:31 PM MRN: [id number] Endoscopist: Ladene Artist , MD Age: 77 Referring MD:  Date of Birth: July 09, 1941 Gender: Female Account #: [id number] Procedure:                Colonoscopy Indications:              Iron deficiency anemia Medicines:                Monitored Anesthesia Care Procedure:                Pre-Anesthesia Assessment:                           - Prior to the procedure, a History and Physical                            was performed, and patient medications and                            allergies were reviewed. The patient's tolerance of                            previous anesthesia was also reviewed. The risks                            and benefits of the procedure and the sedation                            options and risks were discussed with the patient.                            All questions were answered, and informed consent                            was obtained. Prior Anticoagulants: The patient has                            taken no previous anticoagulant or antiplatelet                            agents. ASA Grade Assessment: II - A patient with                            mild systemic disease. After reviewing the risks                            and benefits, the patient was deemed in                            satisfactory condition to undergo the procedure.                           After obtaining informed consent, the colonoscope  was passed under direct vision. Throughout the                            procedure, the patient's blood pressure, pulse, and                            oxygen saturations were monitored continuously. The                            Model GIF-HQ190 [id number]) scope was introduced                            through the anus and advanced to the the cecum,                            identified by appendiceal  orifice and ileocecal                            valve. The ileocecal valve, appendiceal orifice,                            and rectum were photographed. The quality of the                            bowel preparation was adequate after extensive                            lavage and suctioning. The patient tolerated the                            procedure well. The colonoscopy was technically                            difficult and complex due to a tortuous colon.                            Successful completion of the procedure was aided by                            using manual pressure, withdrawing and reinserting                            the scope, withdrawing the scope and replacing with                            the adult endoscope, straightening and shortening                            the scope to obtain bowel loop reduction and using                            scope torsion. Scope In: 3:44:23 PM Scope Out: 4:09:32 PM Scope Withdrawal Time: 0 hours 13 minutes 59 seconds  Total  Procedure Duration: 0 hours 25 minutes 9 seconds  Findings:                 The perianal and digital rectal examinations were                            normal.                           A 5 mm polyp was found in the transverse colon. The                            polyp was sessile. The polyp was removed with a                            cold snare. Resection was complete, but the polyp                            tissue was not retrieved.                           A few large-mouthed diverticula were found in the                            ascending colon. There was no evidence of                            diverticular bleeding.                           Many small-mouthed diverticula were found in the                            sigmoid colon. There was narrowing of the colon in                            association with the diverticular opening. There                            was evidence of  diverticular spasm. There was no                            evidence of diverticular bleeding.                           The exam was otherwise without abnormality on                            direct and retroflexion views. Complications:            No immediate complications. Estimated blood loss:                            None. Estimated Blood Loss:     Estimated blood loss: none. Impression:               -  One 5 mm polyp in the transverse colon, removed                            with a cold snare. Complete resection. Polyp tissue                            not retrieved.                           - Moderate diverticulosis in the ascending colon.                            There was no evidence of diverticular bleeding.                           - Severe diverticulosis in the sigmoid colon. There                            was narrowing of the colon in association with the                            diverticular opening. There was evidence of                            diverticular spasm. There was no evidence of                            diverticular bleeding.                           - The examination was otherwise normal on direct                            and retroflexion views. Recommendation:           - Patient has a contact number available for                            emergencies. The signs and symptoms of potential                            delayed complications were discussed with the                            patient. Return to normal activities tomorrow.                            Written discharge instructions were provided to the                            patient.                           - Resume previous diet.                           -  Continue present medications.                           - No repeat colonoscopy due to age. Ladene Artist, MD 02/08/2018 4:24:21 PM This report has been signed electronically.

## 2018-02-09 ENCOUNTER — Telehealth: Payer: Self-pay | Admitting: *Deleted

## 2018-02-09 NOTE — Telephone Encounter (Signed)
  Follow up Call-  Call back number 02/08/2018  Post procedure Call Back phone  # [phone]  Permission to leave phone message Yes  Some recent data might be hidden     Patient questions:  Do you have a fever, pain , or abdominal swelling? No. Pain Score  0 *  Have you tolerated food without any problems? Yes.    Have you been able to return to your normal activities? Yes.    Do you have any questions about your discharge instructions: Diet   No. Medications  No. Follow up visit  No.  Do you have questions or concerns about your Care? No.  Actions: * If pain score is 4 or above: No pain score above 4

## 2018-02-19 ENCOUNTER — Encounter: Payer: Self-pay | Admitting: Gastroenterology

## 2018-03-27 ENCOUNTER — Encounter: Payer: Self-pay | Admitting: Neurology

## 2018-03-27 ENCOUNTER — Ambulatory Visit (INDEPENDENT_AMBULATORY_CARE_PROVIDER_SITE_OTHER): Payer: Medicare HMO | Admitting: Neurology

## 2018-03-27 ENCOUNTER — Telehealth: Payer: Self-pay | Admitting: Neurology

## 2018-03-27 VITALS — BP 139/79 | HR 97 | Ht 63.0 in | Wt 92.8 lb

## 2018-03-27 DIAGNOSIS — M5416 Radiculopathy, lumbar region: Secondary | ICD-10-CM

## 2018-03-27 DIAGNOSIS — R269 Unspecified abnormalities of gait and mobility: Secondary | ICD-10-CM | POA: Insufficient documentation

## 2018-03-27 DIAGNOSIS — G43709 Chronic migraine without aura, not intractable, without status migrainosus: Secondary | ICD-10-CM | POA: Diagnosis not present

## 2018-03-27 DIAGNOSIS — IMO0002 Reserved for concepts with insufficient information to code with codable children: Secondary | ICD-10-CM

## 2018-03-27 MED ORDER — PROPRANOLOL HCL 20 MG PO TABS: 20.0000 mg | ORAL_TABLET | Freq: Two times a day (BID) | ORAL | 6 refills | Status: DC

## 2018-03-27 NOTE — Telephone Encounter (Signed)
Dana scheduled the patient botox appt for 05/10/18.

## 2018-03-27 NOTE — Telephone Encounter (Signed)
Patient needs 4 wk Botox inj appt // Alyssa [name]
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
## 2018-03-28 NOTE — Telephone Encounter (Signed)
Pending notes to be done.

## 2018-03-29 ENCOUNTER — Telehealth: Payer: Self-pay | Admitting: Neurology

## 2018-03-29 NOTE — Telephone Encounter (Signed)
1st botox injection sumbmitted paper work to [employer].

## 2018-03-29 NOTE — Telephone Encounter (Signed)
Faxed notes to [facility] and [facility].

## 2018-04-03 ENCOUNTER — Other Ambulatory Visit (HOSPITAL_COMMUNITY): Payer: Self-pay | Admitting: *Deleted

## 2018-04-03 NOTE — Telephone Encounter (Signed)
Medicaid will not require approval because the patients primary is [employer]. Pending [employer] approval.

## 2018-04-03 NOTE — Telephone Encounter (Signed)
Noted, thank you

## 2018-04-04 ENCOUNTER — Ambulatory Visit (HOSPITAL_COMMUNITY)
Admission: RE | Admit: 2018-04-04 | Discharge: 2018-04-04 | Disposition: A | Discharge status: Home / Self Care | Payer: Medicare HMO | Source: Ambulatory Visit | Attending: Nephrology | Admitting: Nephrology

## 2018-04-04 DIAGNOSIS — N189 Chronic kidney disease, unspecified: Secondary | ICD-10-CM | POA: Insufficient documentation

## 2018-04-04 DIAGNOSIS — D631 Anemia in chronic kidney disease: Secondary | ICD-10-CM | POA: Diagnosis present

## 2018-04-04 MED ORDER — SODIUM CHLORIDE 0.9 % IV SOLN
510.0000 mg | INTRAVENOUS | Status: DC
  Administered 2018-04-04: 510 mg via INTRAVENOUS
  Filled 2018-04-04: qty 17

## 2018-04-04 NOTE — Discharge Instructions (Signed)

## 2018-04-10 NOTE — Telephone Encounter (Signed)
This patient's botox was denied. Clinical notes state she does not meet the minimum daily headache requirement. I called the patient and made her aware, we canceled her apt for the injection in July.

## 2018-04-11 ENCOUNTER — Ambulatory Visit (HOSPITAL_COMMUNITY)
Admission: RE | Admit: 2018-04-11 | Discharge: 2018-04-11 | Disposition: A | Discharge status: Home / Self Care | Payer: Medicare HMO | Source: Ambulatory Visit | Attending: Nephrology | Admitting: Nephrology

## 2018-04-11 DIAGNOSIS — D631 Anemia in chronic kidney disease: Secondary | ICD-10-CM | POA: Diagnosis not present

## 2018-04-11 DIAGNOSIS — N189 Chronic kidney disease, unspecified: Secondary | ICD-10-CM | POA: Diagnosis not present

## 2018-04-11 MED ORDER — SODIUM CHLORIDE 0.9 % IV SOLN
510.0000 mg | INTRAVENOUS | Status: DC
  Administered 2018-04-11: 510 mg via INTRAVENOUS
  Filled 2018-04-11: qty 17

## 2018-05-10 ENCOUNTER — Ambulatory Visit: Payer: Medicare HMO | Admitting: Neurology

## 2018-08-01 IMAGING — CT CT HEAD W/O CM
4 series · 16 of 47 positions shown, 18 images · non-contrast
Comparison: 12/27/2016

CLINICAL DATA: Acute headache starting at 3 a.m. Hypertension.
Dizziness and blurred vision.

EXAM:
CT HEAD WITHOUT CONTRAST
TECHNIQUE: Contiguous axial images were obtained from the base of the skull
through the vertex without intravenous contrast.

[Series 3: head without · axial · non-contrast · 0.44mm/px · z∈[-99,+21]mm · 7 of 32 slices shown, 9 images]
[im 4/32  brain]
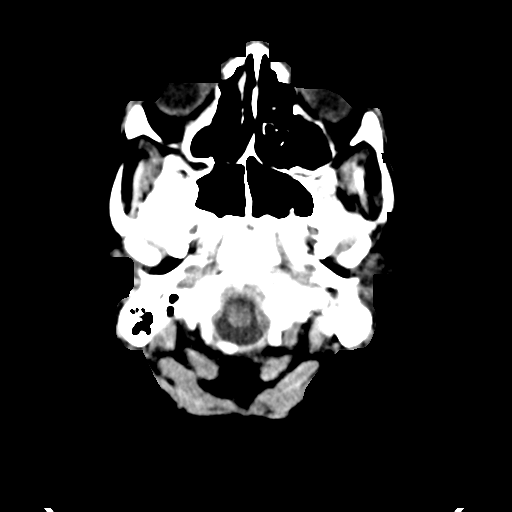
[im 4/32  bone]
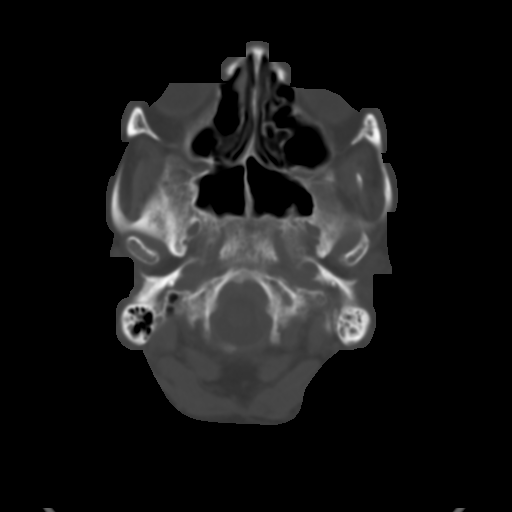
[im 8/32  brain]
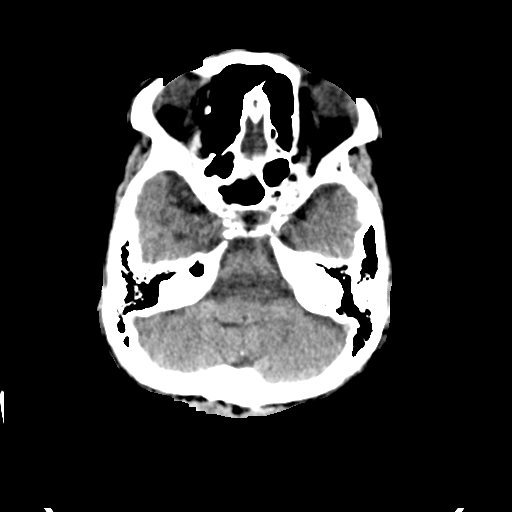
[im 12/32  brain]
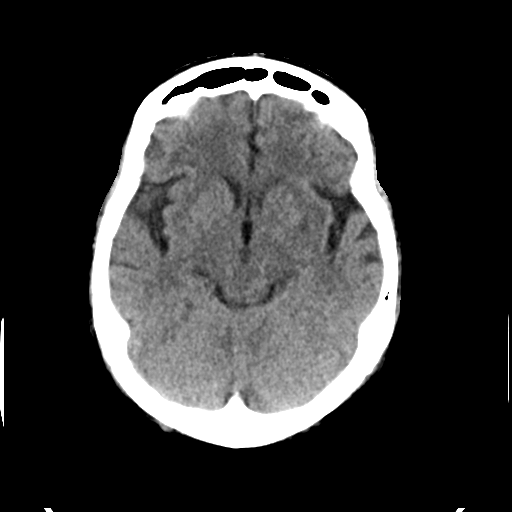
[im 16/32  brain]
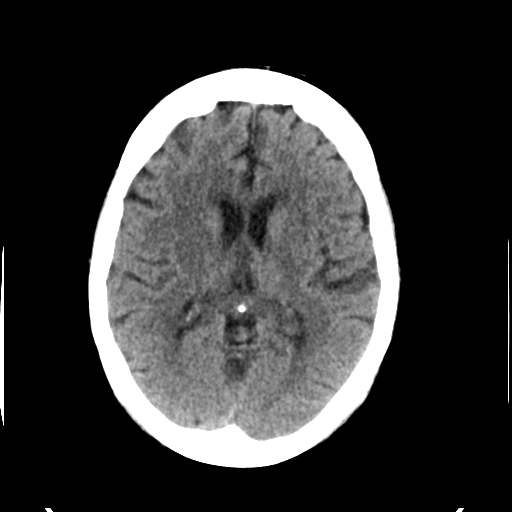
[im 20/32  brain]
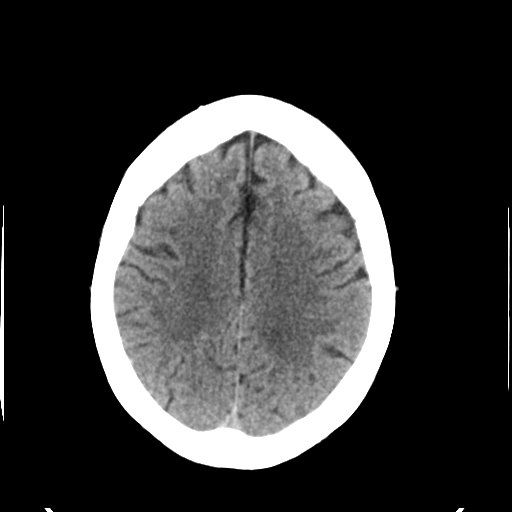
[im 20/32  bone]
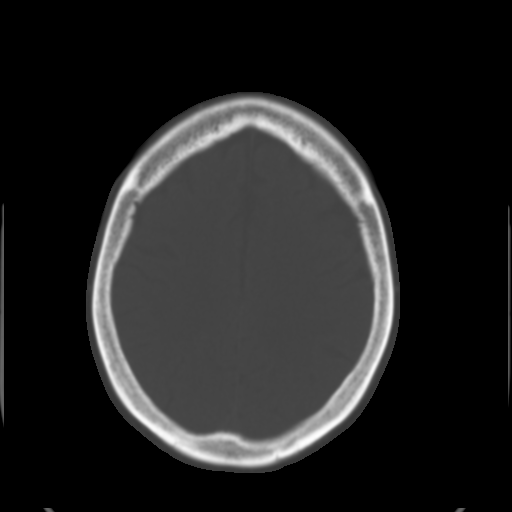
[im 24/32  brain]
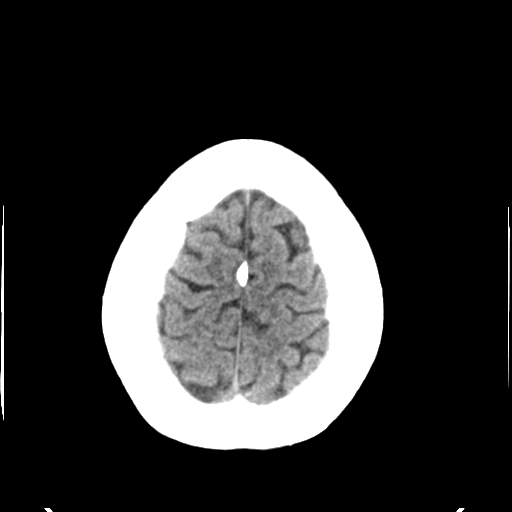
[im 28/32  brain]
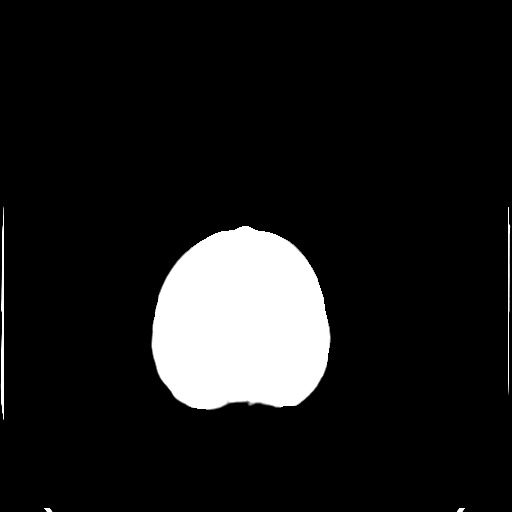

[Series 4: head bone · axial · 0.44mm/px · z∈[-100,-68]mm · 3 of 80 slices shown]
[im 8/80  bone]
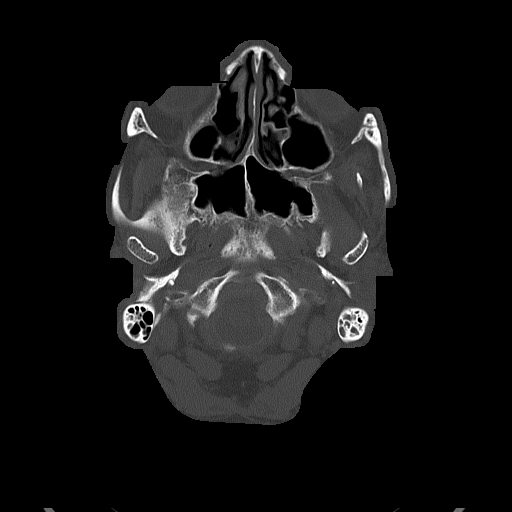
[im 16/80  bone]
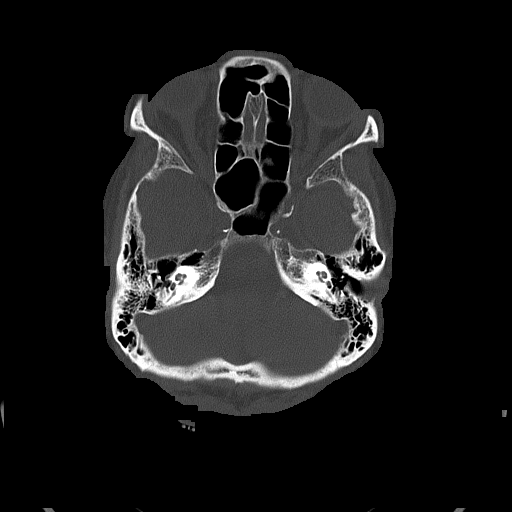
[im 24/80  bone]
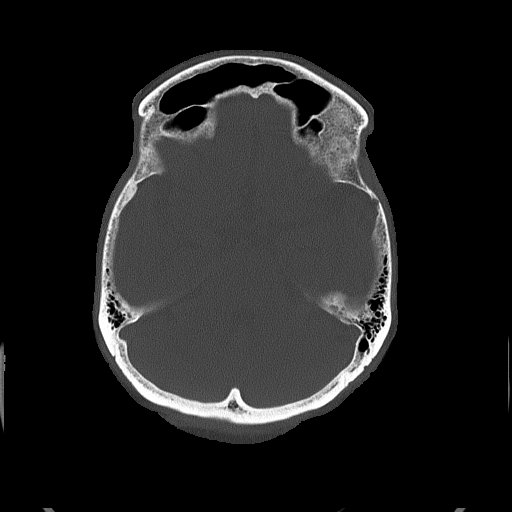

[Series 5: head without cor · coronal · non-contrast · 0.31mm/px · 3 of 68 slices shown]
[im 23/68  brain]
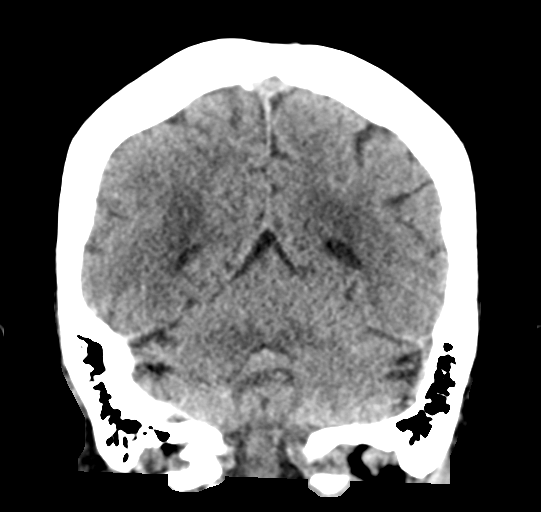
[im 30/68  brain]
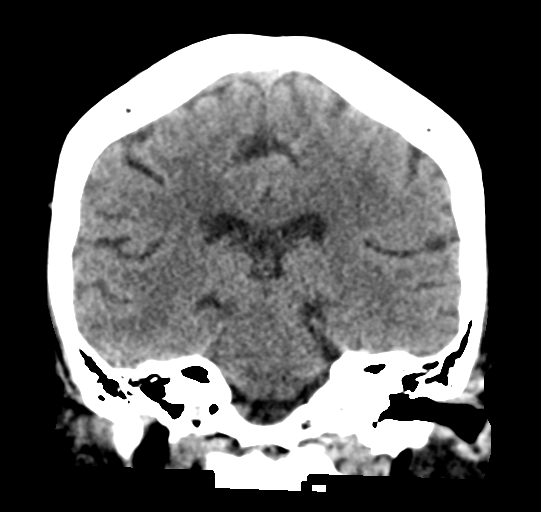
[im 38/68  brain]
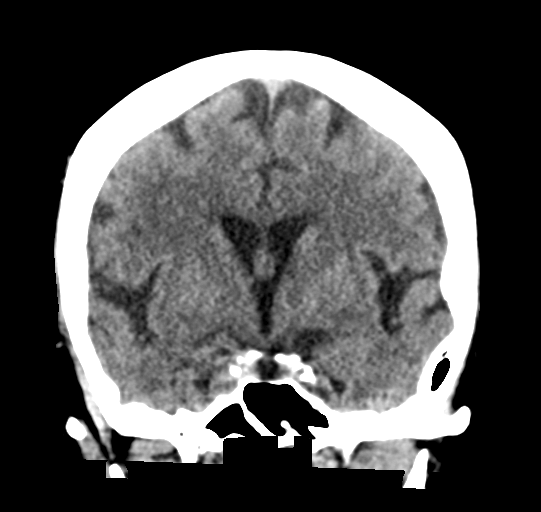

[Series 6: head without sag · sagittal · non-contrast · 0.31mm/px · 3 of 55 slices shown]
[im 19/55  brain]
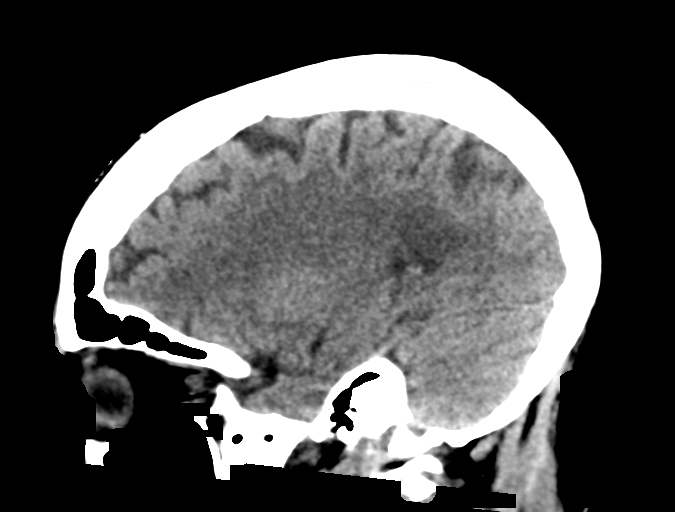
[im 28/55  brain]
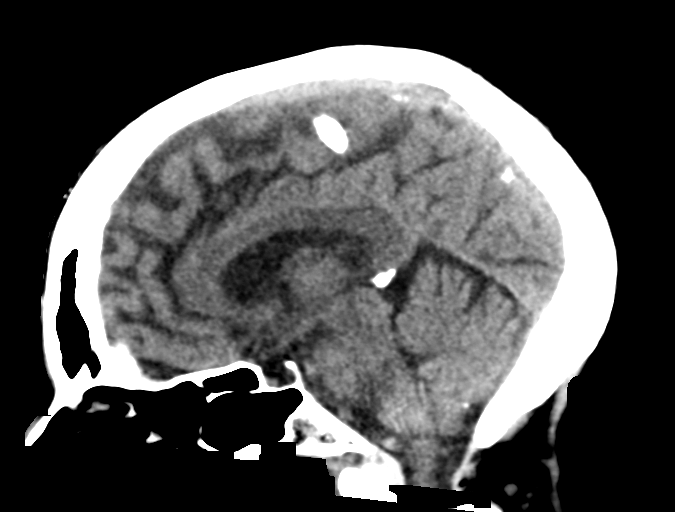
[im 37/55  brain]
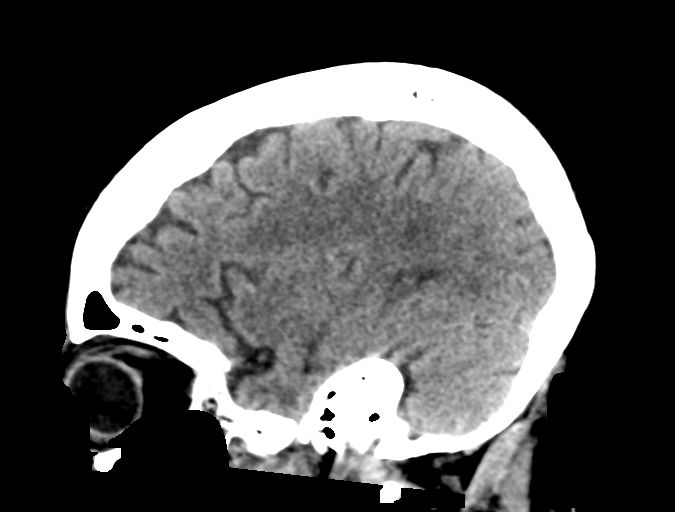

[16 of 47 positions shown; findings below may reference images not displayed]

FINDINGS: Brain: The brainstem, cerebellum, cerebral peduncles, thalami, basal
ganglia, basilar cisterns, and ventricular system appear within
normal limits. Periventricular white matter and corona radiata
hypodensities favor chronic ischemic microvascular white matter
disease. No intracranial hemorrhage, mass lesion, or acute CVA.

Vascular: There is atherosclerotic calcification of the cavernous
carotid arteries bilaterally.

Skull: Unremarkable

Sinuses/Orbits: Chronic bilateral maxillary sinusitis. Prior right
ethmoidectomy and maxillary antrostomy. Small bilateral mastoid
effusions.

Other: Incidental failure fusion of the posterior arch of C1.
IMPRESSION: 1. No acute intracranial findings to explain the patient's current
headache.
2. Periventricular white matter and corona radiata hypodensities
favor chronic ischemic microvascular white matter disease.
3. Chronic bilateral maxillary sinusitis. Small bilateral mastoid
effusions.
4. Atherosclerosis.

## 2018-08-14 ENCOUNTER — Encounter: Payer: Self-pay | Admitting: Neurology

## 2018-08-14 ENCOUNTER — Ambulatory Visit (INDEPENDENT_AMBULATORY_CARE_PROVIDER_SITE_OTHER): Payer: Medicare HMO | Admitting: Neurology

## 2018-08-14 ENCOUNTER — Telehealth: Payer: Self-pay | Admitting: Neurology

## 2018-08-14 VITALS — BP 138/81 | HR 100 | Ht 63.0 in | Wt 93.8 lb

## 2018-08-14 DIAGNOSIS — M5416 Radiculopathy, lumbar region: Secondary | ICD-10-CM | POA: Diagnosis not present

## 2018-08-14 DIAGNOSIS — G43709 Chronic migraine without aura, not intractable, without status migrainosus: Secondary | ICD-10-CM

## 2018-08-14 DIAGNOSIS — IMO0002 Reserved for concepts with insufficient information to code with codable children: Secondary | ICD-10-CM

## 2018-08-14 DIAGNOSIS — R269 Unspecified abnormalities of gait and mobility: Secondary | ICD-10-CM | POA: Diagnosis not present

## 2018-08-14 NOTE — Telephone Encounter (Signed)
3 mo btx
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
## 2018-08-21 NOTE — Telephone Encounter (Signed)
I called the patient to schedule but she did not answer so I left a VM asking her to call me back.

## 2018-10-27 IMAGING — DX DG CHEST 2V
2 series · 2 of 2 positions shown · non-contrast
Comparison: 09/17/2016 and prior radiographs

CLINICAL DATA: Acute shortness of breath today.

EXAM:
CHEST  2 VIEW

[w chest lat]
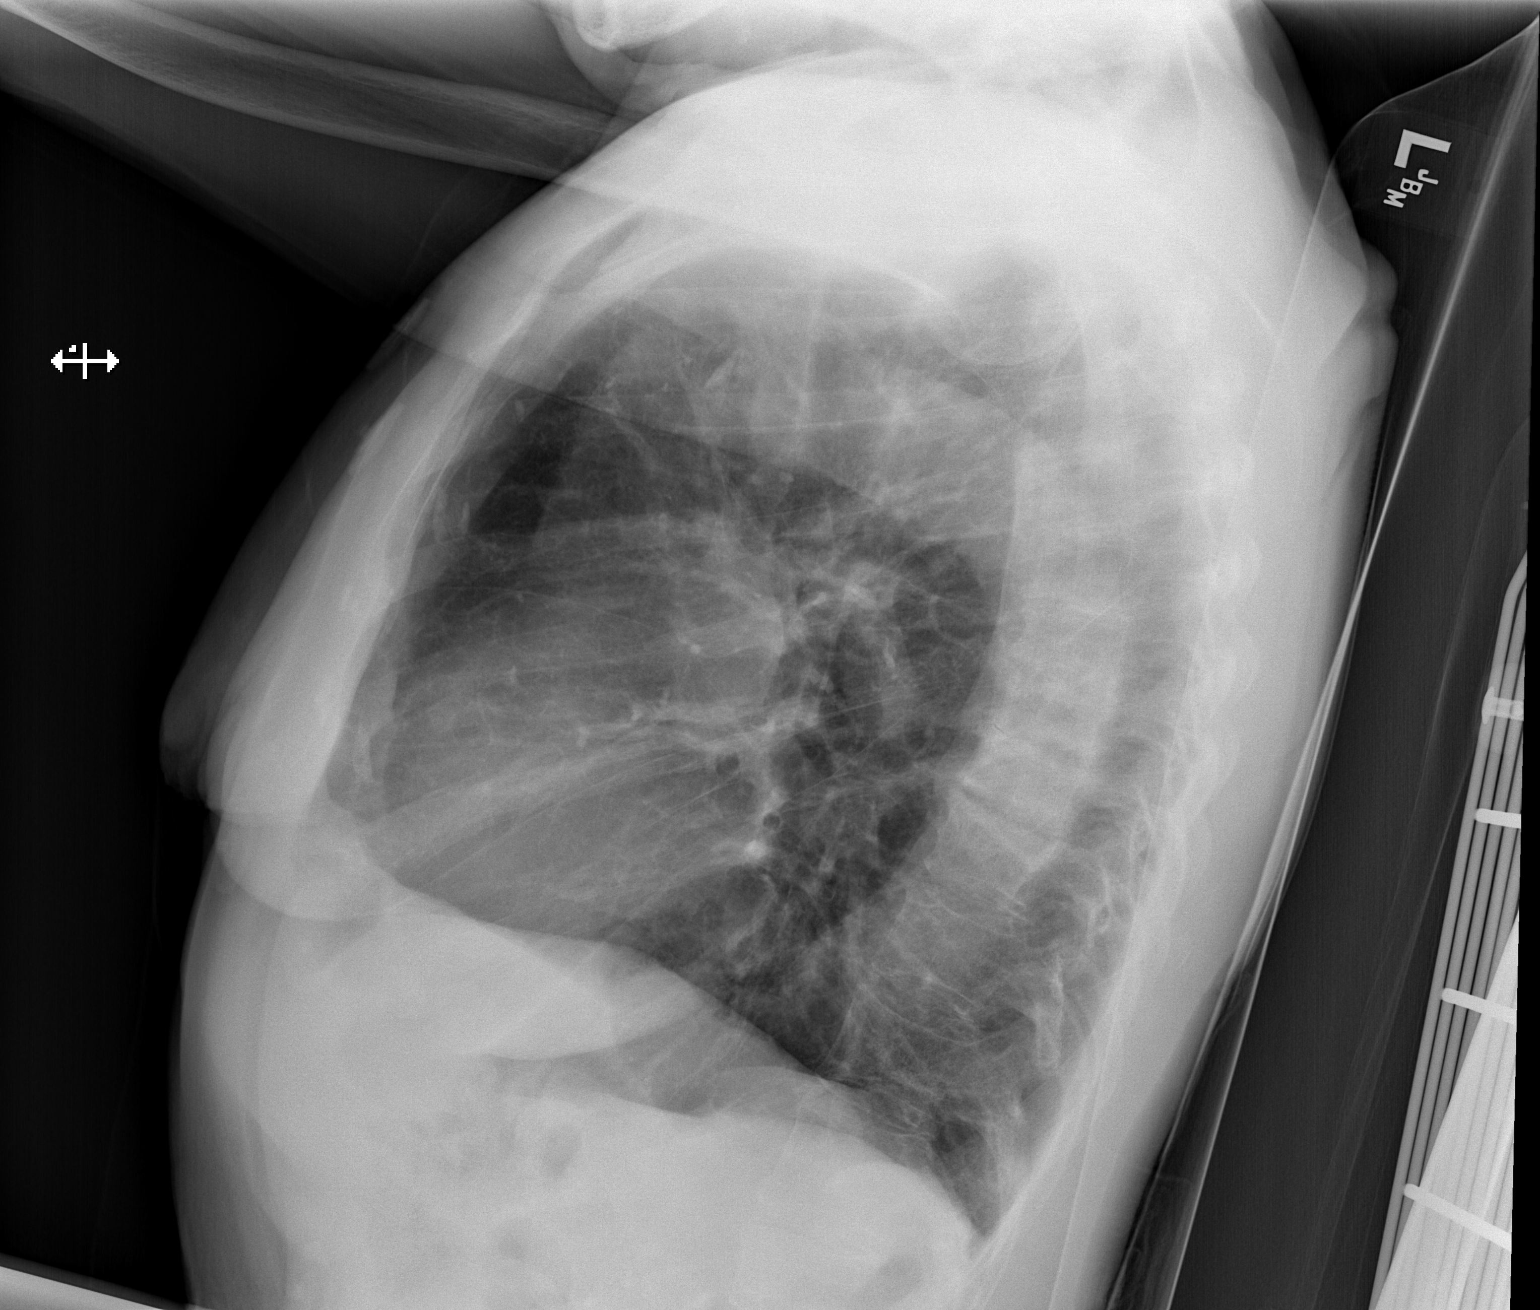

[x chest ap]
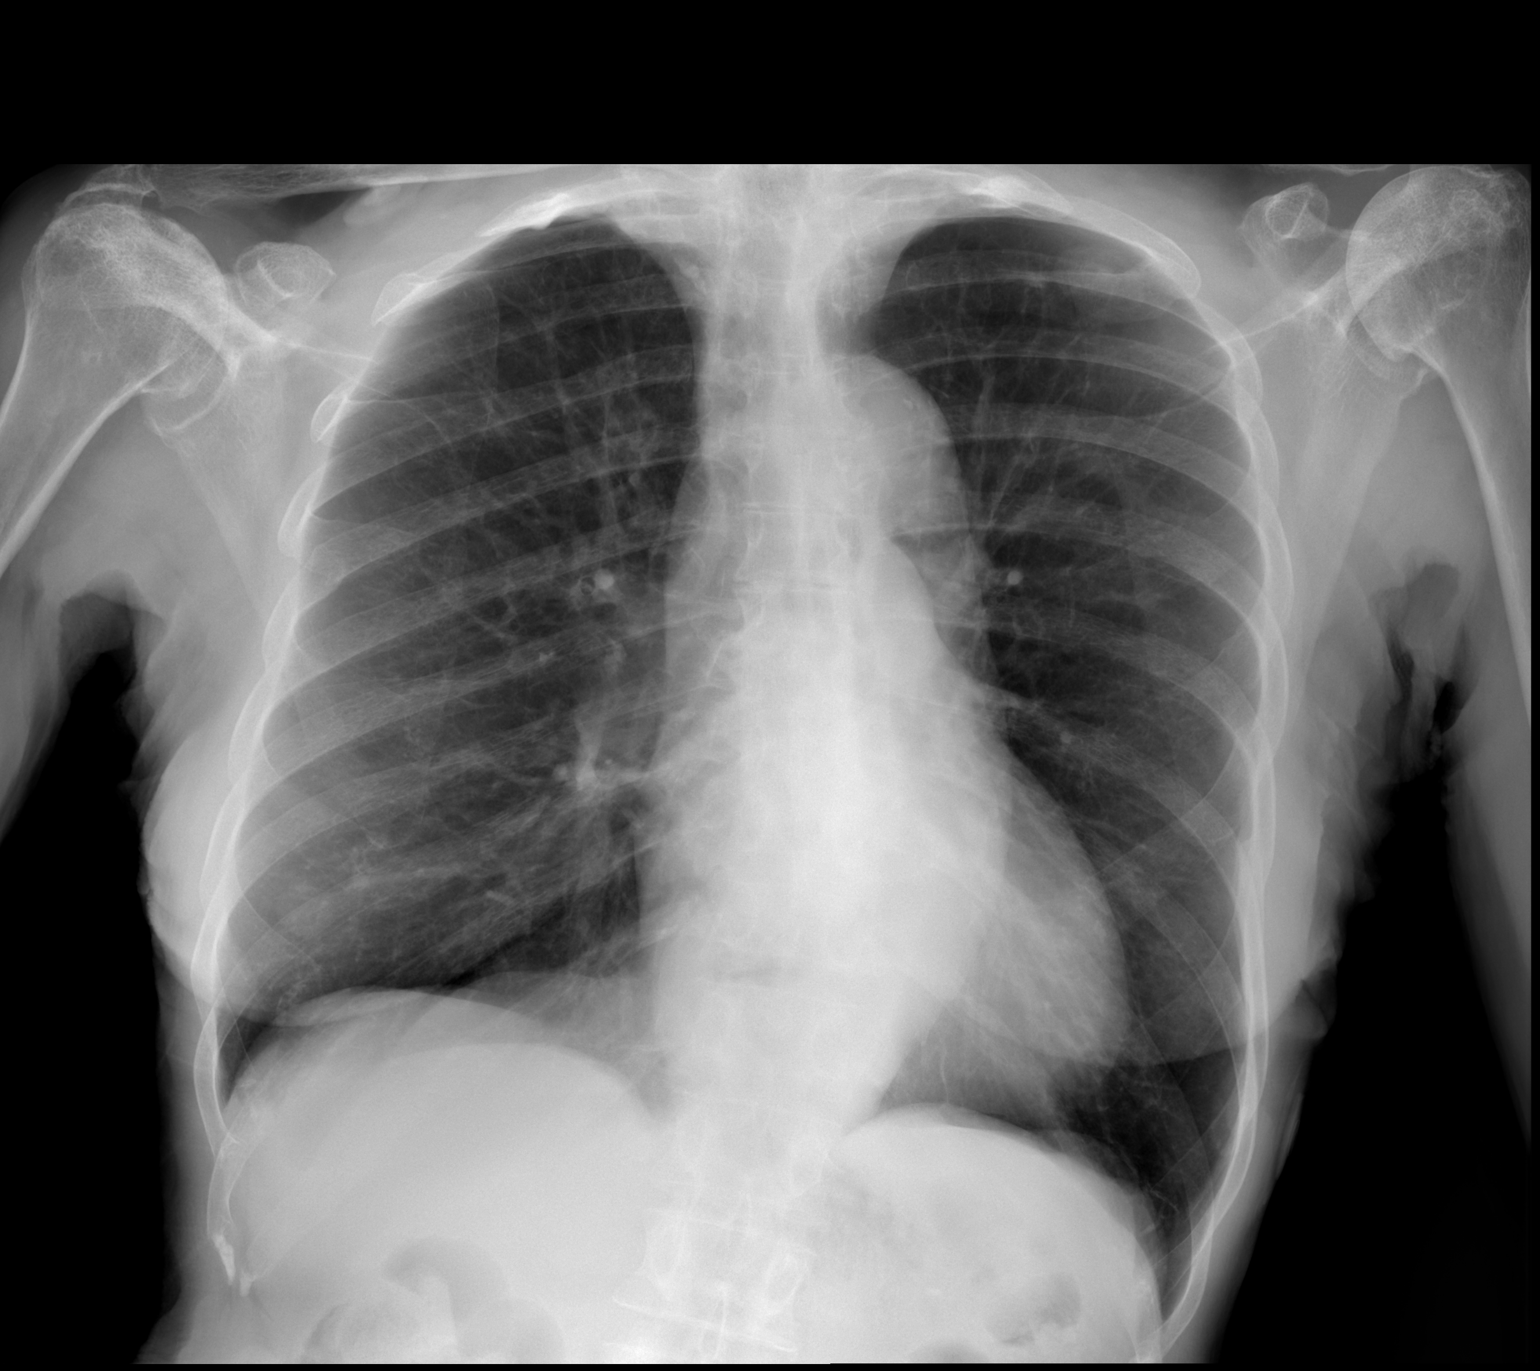

[2 of 2 positions shown; findings below may reference images not displayed]

FINDINGS: The cardiomediastinal silhouette is unremarkable.

COPD/ emphysema changes noted.

There is no evidence of focal airspace disease, pulmonary edema,
suspicious pulmonary nodule/mass, pleural effusion, or pneumothorax.
No acute bony abnormalities are identified.
IMPRESSION: COPD/emphysema without evidence of of acute cardiopulmonary disease.

## 2018-12-09 ENCOUNTER — Inpatient Hospital Stay (HOSPITAL_COMMUNITY)
Admission: EM | Admit: 2018-12-09 | Discharge: 2018-12-13 | DRG: 280 | Disposition: A | Discharge status: Skilled Nursing Facility | Payer: Medicare HMO | Attending: Family Medicine | Admitting: Family Medicine

## 2018-12-09 ENCOUNTER — Other Ambulatory Visit: Payer: Self-pay

## 2018-12-09 ENCOUNTER — Encounter (HOSPITAL_COMMUNITY): Payer: Self-pay

## 2018-12-09 ENCOUNTER — Emergency Department (HOSPITAL_COMMUNITY): Payer: Medicare HMO

## 2018-12-09 DIAGNOSIS — G894 Chronic pain syndrome: Secondary | ICD-10-CM | POA: Diagnosis present

## 2018-12-09 DIAGNOSIS — Z8249 Family history of ischemic heart disease and other diseases of the circulatory system: Secondary | ICD-10-CM

## 2018-12-09 DIAGNOSIS — R0789 Other chest pain: Secondary | ICD-10-CM | POA: Diagnosis not present

## 2018-12-09 DIAGNOSIS — E11649 Type 2 diabetes mellitus with hypoglycemia without coma: Secondary | ICD-10-CM | POA: Diagnosis present

## 2018-12-09 DIAGNOSIS — F419 Anxiety disorder, unspecified: Secondary | ICD-10-CM | POA: Diagnosis present

## 2018-12-09 DIAGNOSIS — I214 Non-ST elevation (NSTEMI) myocardial infarction: Secondary | ICD-10-CM | POA: Diagnosis present

## 2018-12-09 DIAGNOSIS — Z66 Do not resuscitate: Secondary | ICD-10-CM | POA: Diagnosis present

## 2018-12-09 DIAGNOSIS — R7989 Other specified abnormal findings of blood chemistry: Secondary | ICD-10-CM | POA: Diagnosis not present

## 2018-12-09 DIAGNOSIS — F17210 Nicotine dependence, cigarettes, uncomplicated: Secondary | ICD-10-CM | POA: Diagnosis present

## 2018-12-09 DIAGNOSIS — I1310 Hypertensive heart and chronic kidney disease without heart failure, with stage 1 through stage 4 chronic kidney disease, or unspecified chronic kidney disease: Secondary | ICD-10-CM | POA: Diagnosis present

## 2018-12-09 DIAGNOSIS — E1151 Type 2 diabetes mellitus with diabetic peripheral angiopathy without gangrene: Secondary | ICD-10-CM | POA: Diagnosis present

## 2018-12-09 DIAGNOSIS — R072 Precordial pain: Secondary | ICD-10-CM | POA: Diagnosis not present

## 2018-12-09 DIAGNOSIS — G629 Polyneuropathy, unspecified: Secondary | ICD-10-CM | POA: Diagnosis present

## 2018-12-09 DIAGNOSIS — I4581 Long QT syndrome: Secondary | ICD-10-CM | POA: Diagnosis present

## 2018-12-09 DIAGNOSIS — J449 Chronic obstructive pulmonary disease, unspecified: Secondary | ICD-10-CM | POA: Diagnosis present

## 2018-12-09 DIAGNOSIS — R52 Pain, unspecified: Secondary | ICD-10-CM | POA: Diagnosis not present

## 2018-12-09 DIAGNOSIS — M19011 Primary osteoarthritis, right shoulder: Secondary | ICD-10-CM | POA: Diagnosis present

## 2018-12-09 DIAGNOSIS — Z8349 Family history of other endocrine, nutritional and metabolic diseases: Secondary | ICD-10-CM

## 2018-12-09 DIAGNOSIS — I739 Peripheral vascular disease, unspecified: Secondary | ICD-10-CM | POA: Diagnosis present

## 2018-12-09 DIAGNOSIS — R197 Diarrhea, unspecified: Secondary | ICD-10-CM | POA: Diagnosis present

## 2018-12-09 DIAGNOSIS — R778 Other specified abnormalities of plasma proteins: Secondary | ICD-10-CM | POA: Diagnosis present

## 2018-12-09 DIAGNOSIS — M19012 Primary osteoarthritis, left shoulder: Secondary | ICD-10-CM | POA: Diagnosis present

## 2018-12-09 DIAGNOSIS — I129 Hypertensive chronic kidney disease with stage 1 through stage 4 chronic kidney disease, or unspecified chronic kidney disease: Secondary | ICD-10-CM | POA: Diagnosis not present

## 2018-12-09 DIAGNOSIS — M79661 Pain in right lower leg: Secondary | ICD-10-CM | POA: Diagnosis present

## 2018-12-09 DIAGNOSIS — R1110 Vomiting, unspecified: Secondary | ICD-10-CM

## 2018-12-09 DIAGNOSIS — Z809 Family history of malignant neoplasm, unspecified: Secondary | ICD-10-CM

## 2018-12-09 DIAGNOSIS — N179 Acute kidney failure, unspecified: Secondary | ICD-10-CM | POA: Diagnosis present

## 2018-12-09 DIAGNOSIS — N184 Chronic kidney disease, stage 4 (severe): Secondary | ICD-10-CM | POA: Diagnosis present

## 2018-12-09 DIAGNOSIS — R0609 Other forms of dyspnea: Secondary | ICD-10-CM | POA: Diagnosis not present

## 2018-12-09 DIAGNOSIS — E1122 Type 2 diabetes mellitus with diabetic chronic kidney disease: Secondary | ICD-10-CM | POA: Diagnosis present

## 2018-12-09 DIAGNOSIS — R079 Chest pain, unspecified: Secondary | ICD-10-CM

## 2018-12-09 DIAGNOSIS — I5033 Acute on chronic diastolic (congestive) heart failure: Secondary | ICD-10-CM | POA: Diagnosis present

## 2018-12-09 DIAGNOSIS — I959 Hypotension, unspecified: Secondary | ICD-10-CM | POA: Diagnosis present

## 2018-12-09 DIAGNOSIS — R0600 Dyspnea, unspecified: Secondary | ICD-10-CM

## 2018-12-09 DIAGNOSIS — I10 Essential (primary) hypertension: Secondary | ICD-10-CM | POA: Diagnosis not present

## 2018-12-09 DIAGNOSIS — M79662 Pain in left lower leg: Secondary | ICD-10-CM | POA: Diagnosis present

## 2018-12-09 DIAGNOSIS — M4800 Spinal stenosis, site unspecified: Secondary | ICD-10-CM | POA: Diagnosis present

## 2018-12-09 DIAGNOSIS — D631 Anemia in chronic kidney disease: Secondary | ICD-10-CM | POA: Diagnosis present

## 2018-12-09 DIAGNOSIS — Z79899 Other long term (current) drug therapy: Secondary | ICD-10-CM

## 2018-12-09 DIAGNOSIS — Z96641 Presence of right artificial hip joint: Secondary | ICD-10-CM | POA: Diagnosis present

## 2018-12-09 DIAGNOSIS — R159 Full incontinence of feces: Secondary | ICD-10-CM | POA: Diagnosis present

## 2018-12-09 DIAGNOSIS — M79604 Pain in right leg: Secondary | ICD-10-CM | POA: Diagnosis not present

## 2018-12-09 DIAGNOSIS — I219 Acute myocardial infarction, unspecified: Secondary | ICD-10-CM | POA: Diagnosis not present

## 2018-12-09 DIAGNOSIS — M79605 Pain in left leg: Secondary | ICD-10-CM | POA: Diagnosis not present

## 2018-12-09 DIAGNOSIS — R51 Headache: Secondary | ICD-10-CM | POA: Diagnosis present

## 2018-12-09 DIAGNOSIS — N261 Atrophy of kidney (terminal): Secondary | ICD-10-CM | POA: Diagnosis present

## 2018-12-09 DIAGNOSIS — R0902 Hypoxemia: Secondary | ICD-10-CM

## 2018-12-09 DIAGNOSIS — E785 Hyperlipidemia, unspecified: Secondary | ICD-10-CM | POA: Diagnosis present

## 2018-12-09 DIAGNOSIS — Z79891 Long term (current) use of opiate analgesic: Secondary | ICD-10-CM

## 2018-12-09 DIAGNOSIS — I509 Heart failure, unspecified: Secondary | ICD-10-CM

## 2018-12-09 HISTORY — DX: Other specified abnormal findings of blood chemistry: R79.89

## 2018-12-09 HISTORY — DX: Chest pain, unspecified: R07.9

## 2018-12-09 LAB — I-STAT TROPONIN, ED: Troponin i, poc: 1.69 ng/mL (ref 0.00–0.08)

## 2018-12-09 LAB — BASIC METABOLIC PANEL
Anion gap: 13 (ref 5–15)
BUN: 27 mg/dL — ABNORMAL HIGH (ref 8–23)
CO2: 15 mmol/L — ABNORMAL LOW (ref 22–32)
Calcium: 10 mg/dL (ref 8.9–10.3)
Chloride: 110 mmol/L (ref 98–111)
Creatinine, Ser: 1.97 mg/dL — ABNORMAL HIGH (ref 0.44–1.00)
GFR calc Af Amer: 28 mL/min — ABNORMAL LOW (ref >60)
GFR calc non Af Amer: 24 mL/min — ABNORMAL LOW (ref >60)
Glucose, Bld: 162 mg/dL — ABNORMAL HIGH (ref 70–99)
Potassium: 4.2 mmol/L (ref 3.5–5.1)
Sodium: 138 mmol/L (ref 135–145)

## 2018-12-09 LAB — TROPONIN I: Troponin I: 1.45 ng/mL (ref <0.03)

## 2018-12-09 LAB — CBC
HCT: 35.8 % — ABNORMAL LOW (ref 36.0–46.0)
Hemoglobin: 12 g/dL (ref 12.0–15.0)
MCH: 28.4 pg (ref 26.0–34.0)
MCHC: 33.5 g/dL (ref 30.0–36.0)
MCV: 84.6 fL (ref 80.0–100.0)
Platelets: 295 10*3/uL (ref 150–400)
RBC: 4.23 MIL/uL (ref 3.87–5.11)
RDW: 14.6 % (ref 11.5–15.5)
WBC: 7.1 10*3/uL (ref 4.0–10.5)
nRBC: 0 % (ref 0.0–0.2)

## 2018-12-09 LAB — BRAIN NATRIURETIC PEPTIDE: B Natriuretic Peptide: 736.6 pg/mL — ABNORMAL HIGH (ref 0.0–100.0)

## 2018-12-09 LAB — GLUCOSE, CAPILLARY
Glucose-Capillary: 102 mg/dL — ABNORMAL HIGH (ref 70–99)
Glucose-Capillary: 105 mg/dL — ABNORMAL HIGH (ref 70–99)

## 2018-12-09 MED ORDER — TRAMADOL HCL 50 MG PO TABS
50.0000 mg | ORAL_TABLET | Freq: Four times a day (QID) | ORAL | Status: DC | PRN
  Administered 2018-12-12: 50 mg via ORAL
  Filled 2018-12-09: qty 1

## 2018-12-09 MED ORDER — HEPARIN (PORCINE) 25000 UT/250ML-% IV SOLN
500.0000 [IU]/h | INTRAVENOUS | Status: DC
  Administered 2018-12-09: 550 [IU]/h via INTRAVENOUS
  Filled 2018-12-09: qty 250

## 2018-12-09 MED ORDER — CLONAZEPAM 0.5 MG PO TABS
1.0000 mg | ORAL_TABLET | Freq: Two times a day (BID) | ORAL | Status: DC
  Administered 2018-12-09 – 2018-12-13 (×7): 1 mg via ORAL
  Filled 2018-12-09 (×7): qty 2

## 2018-12-09 MED ORDER — CITALOPRAM HYDROBROMIDE 20 MG PO TABS
40.0000 mg | ORAL_TABLET | Freq: Every day | ORAL | Status: DC
  Administered 2018-12-09 – 2018-12-10 (×2): 40 mg via ORAL
  Filled 2018-12-09 (×2): qty 2

## 2018-12-09 MED ORDER — ROSUVASTATIN CALCIUM 5 MG PO TABS
10.0000 mg | ORAL_TABLET | Freq: Every day | ORAL | Status: DC
  Administered 2018-12-09 – 2018-12-13 (×5): 10 mg via ORAL
  Filled 2018-12-09 (×5): qty 2

## 2018-12-09 MED ORDER — NITROGLYCERIN 0.4 MG SL SUBL
0.4000 mg | SUBLINGUAL_TABLET | SUBLINGUAL | Status: DC | PRN
  Administered 2018-12-11 (×3): 0.4 mg via SUBLINGUAL
  Filled 2018-12-09: qty 1

## 2018-12-09 MED ORDER — ACETAMINOPHEN 325 MG PO TABS
650.0000 mg | ORAL_TABLET | ORAL | Status: DC | PRN
  Administered 2018-12-12: 650 mg via ORAL
  Filled 2018-12-09: qty 2

## 2018-12-09 MED ORDER — SODIUM CHLORIDE 0.9% FLUSH: 3.0000 mL | Freq: Once | INTRAVENOUS | Status: DC

## 2018-12-09 MED ORDER — LITHIUM CARBONATE 300 MG PO CAPS
300.0000 mg | ORAL_CAPSULE | Freq: Every day | ORAL | Status: DC
  Administered 2018-12-09 – 2018-12-13 (×5): 300 mg via ORAL
  Filled 2018-12-09 (×5): qty 1

## 2018-12-09 MED ORDER — ASPIRIN 81 MG PO CHEW
324.0000 mg | CHEWABLE_TABLET | Freq: Once | ORAL | Status: AC
  Administered 2018-12-09: 324 mg via ORAL
  Filled 2018-12-09: qty 4

## 2018-12-09 MED ORDER — HEPARIN BOLUS VIA INFUSION
2500.0000 [IU] | Freq: Once | INTRAVENOUS | Status: AC
  Administered 2018-12-09: 2500 [IU] via INTRAVENOUS
  Filled 2018-12-09: qty 2500

## 2018-12-09 MED ORDER — INSULIN ASPART 100 UNIT/ML ~~LOC~~ SOLN: 0.0000 [IU] | Freq: Every day | SUBCUTANEOUS | Status: DC

## 2018-12-09 MED ORDER — ONDANSETRON HCL 4 MG/2ML IJ SOLN: 4.0000 mg | Freq: Four times a day (QID) | INTRAMUSCULAR | Status: DC | PRN

## 2018-12-09 MED ORDER — METOPROLOL TARTRATE 25 MG PO TABS
25.0000 mg | ORAL_TABLET | Freq: Two times a day (BID) | ORAL | Status: DC
  Administered 2018-12-09 – 2018-12-13 (×7): 25 mg via ORAL
  Filled 2018-12-09 (×7): qty 1

## 2018-12-09 MED ORDER — AMLODIPINE BESYLATE 10 MG PO TABS
10.0000 mg | ORAL_TABLET | Freq: Every day | ORAL | Status: DC
  Administered 2018-12-09: 10 mg via ORAL
  Filled 2018-12-09 (×2): qty 1

## 2018-12-09 MED ORDER — GABAPENTIN 300 MG PO CAPS
300.0000 mg | ORAL_CAPSULE | Freq: Three times a day (TID) | ORAL | Status: DC
  Administered 2018-12-09 – 2018-12-13 (×11): 300 mg via ORAL
  Filled 2018-12-09 (×11): qty 1

## 2018-12-09 MED ORDER — MORPHINE SULFATE (PF) 2 MG/ML IV SOLN
2.0000 mg | Freq: Once | INTRAVENOUS | Status: AC
  Administered 2018-12-09: 2 mg via INTRAVENOUS
  Filled 2018-12-09: qty 1

## 2018-12-09 MED ORDER — CLONIDINE HCL 0.1 MG PO TABS
0.1000 mg | ORAL_TABLET | Freq: Three times a day (TID) | ORAL | Status: DC
  Administered 2018-12-09 – 2018-12-10 (×2): 0.1 mg via ORAL
  Filled 2018-12-09 (×2): qty 1

## 2018-12-09 MED ORDER — PANTOPRAZOLE SODIUM 40 MG PO TBEC
40.0000 mg | DELAYED_RELEASE_TABLET | Freq: Every day | ORAL | Status: DC
  Administered 2018-12-09 – 2018-12-13 (×5): 40 mg via ORAL
  Filled 2018-12-09 (×5): qty 1

## 2018-12-09 MED ORDER — TEMAZEPAM 7.5 MG PO CAPS
30.0000 mg | ORAL_CAPSULE | Freq: Every day | ORAL | Status: DC
  Administered 2018-12-09: 30 mg via ORAL
  Filled 2018-12-09 (×2): qty 4

## 2018-12-09 MED ORDER — FERROUS SULFATE 325 (65 FE) MG PO TABS
325.0000 mg | ORAL_TABLET | ORAL | Status: DC
  Administered 2018-12-11 – 2018-12-13 (×2): 325 mg via ORAL
  Filled 2018-12-09 (×2): qty 1

## 2018-12-09 MED ORDER — SODIUM CHLORIDE 0.9 % IV SOLN
INTRAVENOUS | Status: DC
  Administered 2018-12-09 – 2018-12-10 (×2): via INTRAVENOUS

## 2018-12-09 MED ORDER — INSULIN ASPART 100 UNIT/ML ~~LOC~~ SOLN
0.0000 [IU] | Freq: Three times a day (TID) | SUBCUTANEOUS | Status: DC
  Administered 2018-12-10: 2 [IU] via SUBCUTANEOUS

## 2018-12-09 MED ORDER — ASPIRIN EC 81 MG PO TBEC
81.0000 mg | DELAYED_RELEASE_TABLET | Freq: Every day | ORAL | Status: DC
  Administered 2018-12-10 – 2018-12-13 (×4): 81 mg via ORAL
  Filled 2018-12-09 (×4): qty 1

## 2018-12-09 MED ORDER — NITROGLYCERIN 0.4 MG SL SUBL
0.4000 mg | SUBLINGUAL_TABLET | SUBLINGUAL | Status: DC | PRN
  Administered 2018-12-09: 0.4 mg via SUBLINGUAL
  Filled 2018-12-09 (×2): qty 1

## 2018-12-09 MED ORDER — ISOSORB DINITRATE-HYDRALAZINE 20-37.5 MG PO TABS
1.0000 | ORAL_TABLET | Freq: Three times a day (TID) | ORAL | Status: DC
  Administered 2018-12-09: 1 via ORAL
  Filled 2018-12-09 (×2): qty 1

## 2018-12-09 NOTE — ED Triage Notes (Signed)
Pt from home; c/o SOB x 2 days, central CP that began last night, and leg, foot, and back pain x 3 weeks; denies injury; CP radiates to neck, aching, 7/10; Hx HTN

## 2018-12-09 NOTE — Consult Note (Signed)
CARDIOLOGY CONSULT NOTE  Patient ID: Alyssa [name] MRN: [id number] DOB/AGE: [birth year] 76 y.o.  Admit date: 12/09/2018 Referring Physician  Loma Boston, MD Primary Physician:  Everardo Beals, NP Reason for Consultation  Abnormal serum troponin, abnormal EKG and dyspnea  HPI: Alyssa [name]  is a 78 y.o. 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  She is also been having some orthopnea.  She also complains of bilateral calf pain and is one of her main complaints.  No lower extremity edema, no change in her activity level.  On further questioning she does admit to having chest pain.  Chest pain is described as sharp pain in the right shoulder, in the right lower chest, sometimes feels like the chest pain is radiating down her right leg.  This is been ongoing for the past 3 weeks.  Denies retrosternal chest discomfort or chest tightness, no associated symptoms of diaphoresis, chest pain has been on and off for the past 3 weeks.  Past Medical History:  Diagnosis Date  . Anxiety   . Arthritis   . Chronic kidney disease   . Headache(784.0)   . Hyperlipidemia   . Hypertension   . Joint pain   . Leg pain   . Peripheral neuropathy   . Peripheral vascular disease [facility])      Past Surgical History:  Procedure Laterality Date  . ABDOMINAL ANGIOGRAM N/A 10/29/2011   Procedure: ABDOMINAL ANGIOGRAM;  Surgeon: Elam Dutch, MD;  Location: [facility] CATH LAB;  Service: Cardiovascular;  Laterality: N/A;  . BACK SURGERY     . FEMORAL-FEMORAL BYPASS GRAFT  08/31/2010  . JOINT REPLACEMENT Right 2012   Hip  . LOWER EXTREMITY ANGIOGRAPHY  06/14/2017   Procedure: Lower Extremity Angiography;  Surgeon: Adrian Prows, MD;  Location: [place] CV LAB;  Service: Cardiovascular;;  Bilateral limited angio performed  . RENAL ANGIOGRAPHY N/A 06/14/2017   Procedure: Renal Angiography;  Surgeon: Adrian Prows, MD;  Location: [place] CV LAB;  Service: Cardiovascular;  Laterality: N/A;  . TOTAL HIP ARTHROPLASTY     right     Family History  Problem Relation Age of Onset  . Heart attack Mother   . Heart disease Mother   . Hyperlipidemia Mother   . Hypertension Mother   . Heart attack Father   . Heart disease Father        Before age 85  . Hyperlipidemia Father   . Hypertension Father   . Cancer Sister        brain tumor  . Aneurysm Brother        brain  . Colon cancer Neg Hx   . Liver cancer Neg Hx      Social History: Social History   Socioeconomic History  . Marital status: Single    Spouse name: Not on file  . Number of children: 2  . Years of education: 11th  . Highest education level: Not on file  Occupational History  . Occupation: Retired  Scientific laboratory technician  . Financial resource strain: Not on file  . Food insecurity:  Worry: Not on file    Inability: Not on file  . Transportation needs:    Medical: Not on file    Non-medical: Not on file  Tobacco Use  . Smoking status: Light Tobacco Smoker    Packs/day: 0.25    Types: Cigarettes  . Smokeless tobacco: Never Used  . Tobacco comment: 5 cigs a day   Substance and Sexual Activity  . Alcohol use: No  . Drug use: No  . Sexual activity: Not on file  Lifestyle  . Physical activity:    Days per week: Not on file    Minutes per session: Not on file  . Stress: Not on file  Relationships  . Social connections:    Talks on phone: Not on file    Gets together: Not on file    Attends religious service: Not on file    Active member of club or  organization: Not on file    Attends meetings of clubs or organizations: Not on file    Relationship status: Not on file  . Intimate partner violence:    Fear of current or ex partner: Not on file    Emotionally abused: Not on file    Physically abused: Not on file    Forced sexual activity: Not on file  Other Topics Concern  . Not on file  Social History Narrative    Lives at home with her brother.   Right-handed.   Occasional caffeine use.     Medications Prior to Admission  Medication Sig Dispense Refill Last Dose  . amLODipine (NORVASC) 10 MG tablet Take 1 tablet (10 mg total) by mouth daily. 30 tablet 0 12/08/2018 at Unknown time  . citalopram (CELEXA) 40 MG tablet Take 1 tablet (40 mg total) by mouth daily. 30 tablet 0 12/08/2018 at Unknown time  . clonazePAM (KLONOPIN) 1 MG tablet Take 1 tablet (1 mg total) by mouth 2 (two) times daily. (Patient taking differently: Take by mouth 2 (two) times daily. ) 15 tablet 0 12/08/2018 at Unknown time  . cloNIDine (CATAPRES) 0.1 MG tablet Take 1 tablet (0.1 mg total) by mouth 3 (three) times daily. 90 tablet 0 12/08/2018 at Unknown time  . ergocalciferol (VITAMIN D2) 50000 units capsule Take 50,000 Units by mouth every Monday.    12/04/2018  . ferrous sulfate 325 (65 FE) MG tablet Take 1 tablet (325 mg total) by mouth every Monday, Wednesday, and Friday. With breakfast 30 tablet 0 12/08/2018  . gabapentin (NEURONTIN) 300 MG capsule Take 1 capsule by mouth 3 (three) times daily.  0 12/08/2018 at Unknown time  . HYDROcodone-acetaminophen (NORCO/VICODIN) 5-325 MG tablet Take 1 tablet by mouth every 4 (four) hours as needed for moderate pain.    unknown  . labetalol (NORMODYNE) 300 MG tablet Take 1 tablet by mouth 2 (two) times daily.  0 12/08/2018 at Unknown time  . lithium 300 MG tablet Take 300 mg by mouth daily.   12/08/2018 at Unknown time  . Multiple Vitamin (MULTIVITAMIN WITH MINERALS) TABS tablet Take 1 tablet by mouth daily. One a Day Women's    12/08/2018 at Unknown time  . omeprazole (PRILOSEC) 40 MG capsule Take 1 capsule (40 mg total) by mouth daily. 90 capsule 3 12/08/2018 at Unknown time  . rosuvastatin (CRESTOR) 10 MG tablet Take 10 mg by mouth daily.    12/08/2018 at Unknown time  . temazepam (RESTORIL) 30 MG capsule Take 30 mg by mouth at bedtime.   12/08/2018 at Unknown time  .  traMADol (ULTRAM) 50 MG tablet Take 1 tablet (50 mg total) by mouth every 6 (six) hours as needed. (Patient taking differently: Take 50 mg by mouth every 6 (six) hours as needed for moderate pain. ) 30 tablet 5 12/08/2018 at Unknown time    Review of Systems  Constitutional: Negative for malaise/fatigue and weight loss.  Respiratory: Positive for cough and shortness of breath. Negative for hemoptysis.   Cardiovascular: Positive for chest pain and claudication. Negative for palpitations and leg swelling.  Gastrointestinal: Negative for abdominal pain, blood in stool, constipation, heartburn and vomiting.  Genitourinary: Negative for dysuria.  Musculoskeletal: Negative for joint pain and myalgias.  Neurological: Negative for dizziness, focal weakness and headaches.  Endo/Heme/Allergies: Does not bruise/bleed easily.  Psychiatric/Behavioral: Negative for depression. The patient is not nervous/anxious.   All other systems reviewed and are negative.   Physical Exam: Blood pressure (!) 156/105, pulse 90, temperature 98.2 F (36.8 C), temperature source Oral, resp. rate 16, height 5\' 3"  (1.6 m), weight 40.2 kg, SpO2 98 %.  Physical Exam  Constitutional: No distress.  Frail and fatigued-looking in no acute distress  HENT:  Head: Atraumatic.  Eyes: Conjunctivae are normal.  Neck: Neck supple. No JVD present. No thyromegaly present.  Cardiovascular: Normal rate, regular rhythm and normal heart sounds. Exam reveals no gallop.  No murmur heard. Bilateral femorals 2+, absent popliteals, absent PT, DP 1+ bilaterally.  Capillary refill less than 2 seconds.  No  edema, no Homans signs.  Pulmonary/Chest: Effort normal.  Emphysematous chest.  Decreased breath sounds at the bases.  Abdominal: Soft. Bowel sounds are normal.  Musculoskeletal: Normal range of motion.        General: No edema.  Neurological: She is alert.  Skin: Skin is warm.  Psychiatric: She has a normal mood and affect.    Labs:  BNP (last 3 results) No results for input(s): BNP in the last 8760 hours.  CMP Latest Ref Rng & Units 12/09/2018 09/19/2017 06/24/2017  Glucose 70 - 99 mg/dL 162(H) 104(H) 103(H)  BUN 8 - 23 mg/dL 27(H) 34(H) 32(H)  Creatinine 0.44 - 1.00 mg/dL 1.97(H) 1.80(H) 2.00(H)  Sodium 135 - 145 mmol/L 138 137 136  Potassium 3.5 - 5.1 mmol/L 4.2 4.1 4.3  Chloride 98 - 111 mmol/L 110 112(H) 108  CO2 22 - 32 mmol/L 15(L) 17(L) 21(L)  Calcium 8.9 - 10.3 mg/dL 10.0 9.0 8.8(L)  Total Protein 6.5 - 8.1 g/dL - - -  Total Bilirubin 0.3 - 1.2 mg/dL - - -  Alkaline Phos 38 - 126 U/L - - -  AST 15 - 41 U/L - - -  ALT 14 - 54 U/L - - -     CBC Latest Ref Rng & Units 12/09/2018 09/19/2017 06/23/2017  WBC 4.0 - 10.5 K/uL 7.1 6.1 7.9  Hemoglobin 12.0 - 15.0 g/dL 12.0 8.8(L) 8.4(L)  Hematocrit 36.0 - 46.0 % 35.8(L) 26.1(L) 24.8(L)  Platelets 150 - 400 K/uL 295 329 283   HEMOGLOBIN A1C Lab Results  Component Value Date   HGBA1C 5.9 (H) 09/19/2016   MPG 123 09/19/2016    BMP Latest Ref Rng & Units 12/09/2018 09/19/2017 06/24/2017  Glucose 70 - 99 mg/dL 162(H) 104(H) 103(H)  BUN 8 - 23 mg/dL 27(H) 34(H) 32(H)  Creatinine 0.44 - 1.00 mg/dL 1.97(H) 1.80(H) 2.00(H)  Sodium 135 - 145 mmol/L 138 137 136  Potassium 3.5 - 5.1 mmol/L 4.2 4.1 4.3  Chloride 98 - 111 mmol/L 110 112(H) 108  CO2 22 - 32  mmol/L 15(L) 17(L) 21(L)  Calcium 8.9 - 10.3 mg/dL 10.0 9.0 8.8(L)     TSH No results for input(s): TSH in the last 8760 hours.   Radiology: Dg Chest 2 View  Result Date: 12/09/2018 CLINICAL DATA:  Chest pain. EXAM: CHEST - 2 VIEW COMPARISON:  Chest x-ray dated September 18, 2017. FINDINGS: The heart size and mediastinal contours are within normal limits. Normal pulmonary vascularity. Lungs remain hyperinflated. No focal consolidation, pleural effusion, or pneumothorax. No acute osseous abnormality. IMPRESSION: No active cardiopulmonary disease. Electronically Signed   By: Titus Dubin M.D.   On: 12/09/2018 14:08    Scheduled Meds: . amLODipine  10 mg Oral Daily  . [START ON 12/10/2018] aspirin EC  81 mg Oral Daily  . citalopram  40 mg Oral Daily  . clonazePAM  1 mg Oral BID  . cloNIDine  0.1 mg Oral TID  . [START ON 12/11/2018] ferrous sulfate  325 mg Oral Q M,W,F  . gabapentin  300 mg Oral TID  . insulin aspart  0-15 Units Subcutaneous TID WC  . insulin aspart  0-5 Units Subcutaneous QHS  . lithium carbonate  300 mg Oral Daily  . metoprolol tartrate  25 mg Oral BID  . pantoprazole  40 mg Oral Daily  . rosuvastatin  10 mg Oral Daily  . sodium chloride flush  3 mL Intravenous Once  . temazepam  30 mg Oral QHS   Continuous Infusions: . sodium chloride    . heparin 550 Units/hr (12/09/18 1615)   PRN Meds:.acetaminophen, nitroGLYCERIN, nitroGLYCERIN, ondansetron (ZOFRAN) IV, traMADol  CARDIAC STUDIES:  EKG 12/09/2018: Normal sinus rhythm at the rate of 108/m, poor R-wave progression, cannot exclude anteroseptal infarct old.  LVH with repolarization abnormality, cannot exclude inferior and lateral ischemia.  Prolonged QT interval.  Compared to 09/26/2017, the T wave inversion in the lateral leads is new.  Outpatient echocardiogram 11/30/2017: Left ventricle is normal in size, moderate LVH, normal LV systolic function, diastolic function could not be assessed due to his/effusion in the setting of first-degree AV block.  EF estimated at 55%.  Mild MR and mild TR, PA pressure 40 mmHg.  Consistent with mild-to-moderate pulmonary hypertension.  Lexiscan Myoview stress test 03/18/2017: EKG demonstrates LVH with repolarization abnormality.  Stress EKG was  nondiagnostic.  Symptoms included dyspnea. Perfusion imaging study was normal without evidence of ischemia, EF 64%, low risk study.  Renal arteriogram 06/14/2017: Right renal artery is occluded, patent left renal artery.  Left femorofemoral bypass graft evident.  Carotid artery duplex 09/12/2018: Less than 50% bilateral carotid artery stenosis.  Antegrade vertebral artery flow.  Lower extremity arterial duplex and 11/06/18/20: Elevated velocity in the right external iliac artery with monophasic waveforms suggest hemodynamically significant stenosis proximally, moderately abnormal/monophasic waveform throughout the right lower extremity. Monophasic waveform throughout the left lower extremity may indicate significant left proximal iliac artery stenosis.  Moderate velocity increase in the left mid and distal SFA suggests greater than 50% stenosis.  Bilateral ABI moderately decreased at 0.69.  ASSESSMENT AND PLAN:  1.  Abnormal EKG, suggestive of subendocardial myocardial infarction.  Serum troponin mildly elevated. 2.  Shortness of breath, probably related to underlying COPD, may be anginal equivalent.  In a patient with tobacco use disorder, known peripheral arterial disease and leg pain, pulmonary embolism need to be excluded. 3.  Stage IV chronic kidney disease, unilateral kidney. 4.  Peripheral arterial disease with history of either by from bypass surgery, with moderately reduced ABI recently.  2018 abdominal  aortogram revealing patent graft. 5.  Mild hyperlipidemia 6.  Resistant hypertension  Recommendation: I had a lengthy discussion with the patient and her grandson at the bedside.  She does not want to proceed with peripheral arteriogram in view of renal failure, and possible need for dialysis.  She is extremely frail and I would recommend continued medical therapy.  Agree with obtaining V/Q scan to exclude pulmonary embolism.  For now until ACS is ruled out, serial cardiac markers along  with continued IV heparin is indicated for now.  I'll start her on BiDil one p.o. t.i.d. for is distant hypertension.  Symptoms of dyspnea may also be related to acute diastolic heart failure.  Echocardiogram has been ordered.  Adrian Prows, MD, [facility] 12/09/2018, 5:30 PM [facility] [place] Pager: [phone] Office: [phone] If no answer Cell [phone]

## 2018-12-09 NOTE — Progress Notes (Signed)
ANTICOAGULATION CONSULT NOTE - Initial Consult  Pharmacy Consult for heparin Indication: chest pain/ACS  No Known Allergies  Patient Measurements: Height: 5\' 3"  (160 cm) Weight: 97 lb 7 oz (44.2 kg) IBW/kg (Calculated) : 52.4 Heparin Dosing Weight: 44.2 kg  Vital Signs: Temp: 98.2 F (36.8 C) (02/22 1305) Temp Source: Oral (02/22 1305) BP: 151/111 (02/22 1415) Pulse Rate: 91 (02/22 1415)  Labs: Recent Labs    12/09/18 1301  HGB 12.0  HCT 35.8*  PLT 295  CREATININE 1.97*    Estimated Creatinine Clearance: 17.2 mL/min (A) (by C-G formula based on SCr of 1.97 mg/dL (H)).   Medical History: Past Medical History:  Diagnosis Date  . Anxiety   . Arthritis   . Chronic kidney disease   . Headache(784.0)   . Hyperlipidemia   . Hypertension   . Joint pain   . Leg pain   . Peripheral neuropathy   . Peripheral vascular disease ([place])     Assessment: Alyssa [name] is a 77 yoF presenting with radiating chest pain and SOB. Pharmacy consulted to start heparin for ACS vs. PE. Troponin 1.69. Patient not on [facility] PTA. Baseline hgb and platelets wnl.    Goal of Therapy:  Heparin level 0.3-0.7 units/ml Monitor platelets by anticoagulation protocol: Yes   Plan:  Give 2500 units bolus x 1 Start heparin infusion at 550 units/hr Check anti-Xa level in 8 hours and daily while on heparin Continue to monitor H&H and platelets   Thank you for allowing [place] to participate in this patients care.   Jens Som, PharmD Please utilize [employer] (under [place]) for appropriate number for your unit pharmacist. 12/09/2018 2:50 PM

## 2018-12-09 NOTE — H&P (Signed)
History and Physical  Alyssa [name]:[id number] DOB: September 19, 1941 DOA: 12/09/2018  Referring physician: Dr Dewayne Hatch, ED physician PCP: Everardo Beals, NP  Outpatient Specialists: Dr Nadyne Coombes (Cardiology)  Patient Coming From: home  Chief Complaint: Chest pain, shortness of breath  HPI: Alyssa [name] is a 78 y.o. female with a history of peripheral vascular disease, chronic pain syndrome, essential hypertension, hyperlipidemia, stage IV chronic kidney disease.  Patient seen for exertional, left substernal, nonradiating chest pain that began last night and was intermittent.  Pain worse with exertion and improved with rest.  Had shortness of breath and diaphoresis with these episodes.  The episodes would improve with resting for 20 minutes, and then would return when she began to be more active.  She does have a dry cough.  Denies orthopnea.  Emergency Department Course: Point-of-care troponin I 1.69.  Creatinine 1.97 (baseline appears to be around 2).  Review of Systems:   Pt denies any fevers, chills, nausea, vomiting, diarrhea, constipation, abdominal pain, shortness of breath, dyspnea on exertion, orthopnea, cough, wheezing, palpitations, headache, vision changes, lightheadedness, dizziness, melena, rectal bleeding.  Review of systems are otherwise negative  Past Medical History:  Diagnosis Date  . Anxiety   . Arthritis   . Chronic kidney disease   . Headache(784.0)   . Hyperlipidemia   . Hypertension   . Joint pain   . Leg pain   . Peripheral neuropathy   . Peripheral vascular disease [facility])    Past Surgical History:  Procedure Laterality Date  . ABDOMINAL ANGIOGRAM N/A 10/29/2011   Procedure: ABDOMINAL ANGIOGRAM;  Surgeon: Elam Dutch, MD;  Location: [facility] CATH LAB;  Service: Cardiovascular;  Laterality: N/A;  . BACK SURGERY    . FEMORAL-FEMORAL BYPASS GRAFT  08/31/2010  . JOINT REPLACEMENT Right 2012   Hip  . LOWER EXTREMITY ANGIOGRAPHY  06/14/2017   Procedure: Lower Extremity Angiography;  Surgeon: Adrian Prows, MD;  Location: [place] [facility] LAB;  Service: Cardiovascular;;  Bilateral limited angio performed  . RENAL ANGIOGRAPHY N/A 06/14/2017   Procedure: Renal Angiography;  Surgeon: Adrian Prows, MD;  Location: [place] [facility];  Service: Cardiovascular;  Laterality: N/A;  . TOTAL HIP ARTHROPLASTY     right   Social History:  reports that she has been smoking cigarettes. She has been smoking about 0.25 packs per day. She has never used smokeless tobacco. She reports that she does not drink alcohol or use drugs. Patient lives at home  No Known Allergies  Family History  Problem Relation Age of Onset  . Heart attack Mother   . Heart disease Mother   . Hyperlipidemia Mother   . Hypertension Mother   . Heart attack Father   . Heart disease Father        Before age 18  . Hyperlipidemia Father   . Hypertension Father   . Cancer Sister        brain tumor  . Aneurysm Brother        brain  . Colon cancer Neg Hx   . Liver cancer Neg Hx       Prior to Admission medications   Medication Sig Start Date End Date Taking? Authorizing Provider  amLODipine (NORVASC) 10 MG tablet Take 1 tablet (10 mg total) by mouth daily. 12/29/16  Yes Robbie Lis, MD  citalopram (CELEXA) 40 MG tablet Take 1 tablet (40 mg total) by mouth daily. 08/30/15  Yes Delfin Gant, NP  clonazePAM (KLONOPIN) 1 MG tablet Take 1 tablet (  1 mg total) by mouth 2 (two) times daily. Patient taking differently: Take by mouth 2 (two) times daily.  12/28/16  Yes Robbie Lis, MD  cloNIDine (CATAPRES) 0.1 MG tablet Take 1 tablet (0.1 mg total) by mouth 3 (three) times daily. 06/24/17  Yes Patrecia Pour, MD  ergocalciferol (VITAMIN D2) 50000 units capsule Take 50,000 Units by mouth every Monday.    Yes [provider]  ferrous sulfate 325 (65 FE) MG tablet Take 1 tablet (325 mg total) by mouth every Monday, Wednesday, and Friday. With breakfast 09/20/16  Yes Florencia Reasons, MD  gabapentin (NEURONTIN) 300 MG capsule Take 1 capsule by mouth 3 (three) times daily. 05/16/17  Yes [provider]  HYDROcodone-acetaminophen (NORCO/VICODIN) 5-325 MG tablet Take 1 tablet by mouth every 4 (four) hours as needed for moderate pain.    Yes [provider]  labetalol (NORMODYNE) 300 MG tablet Take 1 tablet by mouth 2 (two) times daily. 06/05/17  Yes [provider]  lithium 300 MG tablet Take 300 mg by mouth daily.   Yes [provider]  Multiple Vitamin (MULTIVITAMIN WITH MINERALS) TABS tablet Take 1 tablet by mouth daily. One a Day Women's   Yes [provider]  omeprazole (PRILOSEC) 40 MG capsule Take 1 capsule (40 mg total) by mouth daily. 02/08/18  Yes Ladene Artist, MD  rosuvastatin (CRESTOR) 10 MG tablet Take 10 mg by mouth daily.    Yes [provider]  temazepam (RESTORIL) 30 MG capsule Take 30 mg by mouth at bedtime.   Yes [provider]  traMADol (ULTRAM) 50 MG tablet Take 1 tablet (50 mg total) by mouth every 6 (six) hours as needed. Patient taking differently: Take 50 mg by mouth every 6 (six) hours as needed for moderate pain.  11/29/17  Yes Marcial Pacas, MD    Physical Exam: BP (!) 151/111   Pulse 91   Temp 98.2 F (36.8 C) (Oral)   Resp 20   Ht 5\' 3"  (1.6 m)   Wt 44.2 kg   SpO2 98%   BMI 17.26 kg/m   . General: Elderly black female. Awake and alert and oriented x3. No acute cardiopulmonary distress.  Marland Kitchen HEENT: Normocephalic atraumatic.  Right and left ears normal in appearance.  Pupils equal, round, reactive to light. Extraocular muscles are intact. Sclerae anicteric and noninjected.  Moist mucosal membranes. No mucosal lesions.  . Neck: Neck supple without lymphadenopathy. No carotid bruits. No masses palpated.  . Cardiovascular: Regular rate with normal S1-S2 sounds. No murmurs, rubs, gallops auscultated. No JVD.  No significant lower extremity edema. Marland Kitchen Respiratory: Good respiratory  effort with no wheezes, rales, rhonchi. Lungs clear to auscultation bilaterally.  No accessory muscle use. . Abdomen: Soft, nontender, nondistended. Active bowel sounds. No masses or hepatosplenomegaly  . Skin: No rashes, lesions, or ulcerations.  Dry, warm to touch. 2+ dorsalis pedis and radial pulses. . Musculoskeletal: No calf or leg pain. All major joints not erythematous nontender.  No upper or lower joint deformation.  Good ROM.  No contractures  . Psychiatric: Intact judgment and insight. Pleasant and cooperative. . Neurologic: No focal neurological deficits. Strength is 5/5 and symmetric in upper and lower extremities.  Cranial nerves II through XII are grossly intact.           Labs on Admission: I have personally reviewed following labs and imaging studies  CBC: Recent Labs  Lab 12/09/18 1301  WBC 7.1  HGB 12.0  HCT  35.8*  MCV 84.6  PLT 947   Basic Metabolic Panel: Recent Labs  Lab 12/09/18 1301  NA 138  K 4.2  CL 110  CO2 15*  GLUCOSE 162*  BUN 27*  CREATININE 1.97*  CALCIUM 10.0   GFR: Estimated Creatinine Clearance: 17.2 mL/min (A) (by C-G formula based on SCr of 1.97 mg/dL (H)). Liver Function Tests: No results for input(s): AST, ALT, ALKPHOS, BILITOT, PROT, ALBUMIN in the last 168 hours. No results for input(s): LIPASE, AMYLASE in the last 168 hours. No results for input(s): AMMONIA in the last 168 hours. Coagulation Profile: No results for input(s): INR, PROTIME in the last 168 hours. Cardiac Enzymes: No results for input(s): CKTOTAL, CKMB, CKMBINDEX, TROPONINI in the last 168 hours. BNP (last 3 results) No results for input(s): PROBNP in the last 8760 hours. HbA1C: No results for input(s): HGBA1C in the last 72 hours. CBG: No results for input(s): GLUCAP in the last 168 hours. Lipid Profile: No results for input(s): CHOL, HDL, LDLCALC, TRIG, CHOLHDL, LDLDIRECT in the last 72 hours. Thyroid Function Tests: No results for input(s): TSH, T4TOTAL,  FREET4, T3FREE, THYROIDAB in the last 72 hours. Anemia Panel: No results for input(s): VITAMINB12, FOLATE, FERRITIN, TIBC, IRON, RETICCTPCT in the last 72 hours. Urine analysis:    Component Value Date/Time   COLORURINE STRAW (A) 06/24/2017 0228   APPEARANCEUR CLEAR 06/24/2017 0228   LABSPEC 1.008 06/24/2017 0228   PHURINE 7.0 06/24/2017 0228   GLUCOSEU NEGATIVE 06/24/2017 0228   HGBUR NEGATIVE 06/24/2017 0228   BILIRUBINUR NEGATIVE 06/24/2017 0228   KETONESUR NEGATIVE 06/24/2017 0228   PROTEINUR 30 (A) 06/24/2017 0228   UROBILINOGEN 0.2 08/24/2015 1737   NITRITE NEGATIVE 06/24/2017 0228   LEUKOCYTESUR NEGATIVE 06/24/2017 0228   Sepsis Labs: @LABRCNTIP (procalcitonin:4,lacticidven:4) )No results found for this or any previous visit (from the past 240 hour(s)).   Radiological Exams on Admission: Dg Chest 2 View  Result Date: 12/09/2018 CLINICAL DATA:  Chest pain. EXAM: CHEST - 2 VIEW COMPARISON:  Chest x-ray dated September 18, 2017. FINDINGS: The heart size and mediastinal contours are within normal limits. Normal pulmonary vascularity. Lungs remain hyperinflated. No focal consolidation, pleural effusion, or pneumothorax. No acute osseous abnormality. IMPRESSION: No active cardiopulmonary disease. Electronically Signed   By: Titus Dubin M.D.   On: 12/09/2018 14:08    EKG: Independently reviewed.  Sinus rhythm.  Elevated ST waves in the inferior leads.  Assessment/Plan: Principal Problem:   Elevated troponin level Active Problems:   PVD (peripheral vascular disease) with claudication (HCC)   Essential hypertension   Hyperlipidemia   CKD (chronic kidney disease) stage 4, GFR 15-29 ml/min (HCC)   Chronic pain syndrome    This patient was discussed with the ED physician, including pertinent vitals, physical exam findings, labs, and imaging.  We also discussed care given by the ED provider.  1. Elevated troponin level a. EDP discussed patient with Dr. Einar Gip, who is concerned  about PE.  On my exam, I do not feel that the patient has significant symptoms or exam findings for PE.  However, will rule out PE with VQ scan given the patient's elevated creatinine.  Additionally, will start the patient on heparin drip in the interim b. Telemetry monitoring c. Serial troponins d. Start baby aspirin daily e. Change labetalol to metoprolol 2. Chronic kidney disease a. Appears to be at baseline b. We will IV hydrate (cautiously) 3. Hypertension a. Continue antihypertensives 4. Hyperlipidemia a. Continue statin 5. Peripheral vascular disease 6. Chronic pain a.  On pain medicine regimen  DVT prophylaxis: Full dose heparin Consultants: Cardiology Code Status: Full code Family Communication: None Disposition Plan: Should be able to return home following admission   Truett Mainland, DO

## 2018-12-10 ENCOUNTER — Inpatient Hospital Stay (HOSPITAL_COMMUNITY): Payer: Medicare HMO

## 2018-12-10 DIAGNOSIS — R0609 Other forms of dyspnea: Secondary | ICD-10-CM

## 2018-12-10 DIAGNOSIS — R0600 Dyspnea, unspecified: Secondary | ICD-10-CM

## 2018-12-10 DIAGNOSIS — I214 Non-ST elevation (NSTEMI) myocardial infarction: Secondary | ICD-10-CM

## 2018-12-10 DIAGNOSIS — R079 Chest pain, unspecified: Secondary | ICD-10-CM

## 2018-12-10 LAB — BLOOD GAS, ARTERIAL
Acid-base deficit: 8.6 mmol/L — ABNORMAL HIGH (ref 0.0–2.0)
Bicarbonate: 16.9 mmol/L — ABNORMAL LOW (ref 20.0–28.0)
Drawn by: 39898
O2 Saturation: 87.5 %
Patient temperature: 97.4
pCO2 arterial: 36 mmHg (ref 32.0–48.0)
pH, Arterial: 7.289 — ABNORMAL LOW (ref 7.350–7.450)
pO2, Arterial: 56.9 mmHg — ABNORMAL LOW (ref 83.0–108.0)

## 2018-12-10 LAB — GLUCOSE, CAPILLARY
Glucose-Capillary: 94 mg/dL (ref 70–99)
Glucose-Capillary: 124 mg/dL — ABNORMAL HIGH (ref 70–99)
Glucose-Capillary: 48 mg/dL — ABNORMAL LOW (ref 70–99)
Glucose-Capillary: 63 mg/dL — ABNORMAL LOW (ref 70–99)
Glucose-Capillary: 95 mg/dL (ref 70–99)
Glucose-Capillary: 128 mg/dL — ABNORMAL HIGH (ref 70–99)

## 2018-12-10 LAB — BASIC METABOLIC PANEL
Anion gap: 10 (ref 5–15)
Anion gap: 6 (ref 5–15)
BUN: 32 mg/dL — ABNORMAL HIGH (ref 8–23)
BUN: 35 mg/dL — ABNORMAL HIGH (ref 8–23)
CO2: 17 mmol/L — ABNORMAL LOW (ref 22–32)
CO2: 16 mmol/L — ABNORMAL LOW (ref 22–32)
Calcium: 8.6 mg/dL — ABNORMAL LOW (ref 8.9–10.3)
Calcium: 8.6 mg/dL — ABNORMAL LOW (ref 8.9–10.3)
Chloride: 112 mmol/L — ABNORMAL HIGH (ref 98–111)
Chloride: 116 mmol/L — ABNORMAL HIGH (ref 98–111)
Creatinine, Ser: 2.03 mg/dL — ABNORMAL HIGH (ref 0.44–1.00)
Creatinine, Ser: 2.1 mg/dL — ABNORMAL HIGH (ref 0.44–1.00)
GFR calc Af Amer: 27 mL/min — ABNORMAL LOW (ref >60)
GFR calc Af Amer: 26 mL/min — ABNORMAL LOW (ref >60)
GFR calc non Af Amer: 23 mL/min — ABNORMAL LOW (ref >60)
GFR calc non Af Amer: 22 mL/min — ABNORMAL LOW (ref >60)
Glucose, Bld: 104 mg/dL — ABNORMAL HIGH (ref 70–99)
Glucose, Bld: 71 mg/dL (ref 70–99)
Potassium: 4.9 mmol/L (ref 3.5–5.1)
Potassium: 5.8 mmol/L — ABNORMAL HIGH (ref 3.5–5.1)
Sodium: 139 mmol/L (ref 135–145)
Sodium: 138 mmol/L (ref 135–145)

## 2018-12-10 LAB — HEMOGLOBIN A1C
Hgb A1c MFr Bld: 6.6 % — ABNORMAL HIGH (ref 4.8–5.6)
Mean Plasma Glucose: 142.72 mg/dL

## 2018-12-10 LAB — HEPARIN LEVEL (UNFRACTIONATED)
Heparin Unfractionated: 0.62 IU/mL (ref 0.30–0.70)
Heparin Unfractionated: 0.69 IU/mL (ref 0.30–0.70)

## 2018-12-10 LAB — TROPONIN I
Troponin I: 1.03 ng/mL (ref <0.03)
Troponin I: 0.66 ng/mL (ref <0.03)
Troponin I: 0.4 ng/mL (ref <0.03)

## 2018-12-10 LAB — BRAIN NATRIURETIC PEPTIDE: B Natriuretic Peptide: 744.1 pg/mL — ABNORMAL HIGH (ref 0.0–100.0)

## 2018-12-10 MED ORDER — TECHNETIUM TO 99M ALBUMIN AGGREGATED
4.3700 | Freq: Once | INTRAVENOUS | Status: AC | PRN
  Administered 2018-12-10: 4.37 via INTRAVENOUS

## 2018-12-10 MED ORDER — SODIUM CHLORIDE 0.9 % IV BOLUS
500.0000 mL | Freq: Once | INTRAVENOUS | Status: AC
  Administered 2018-12-10: 500 mL via INTRAVENOUS

## 2018-12-10 MED ORDER — TECHNETIUM TC 99M DIETHYLENETRIAME-PENTAACETIC ACID
31.8000 | Freq: Once | INTRAVENOUS | Status: AC | PRN
  Administered 2018-12-10: 31.8 via RESPIRATORY_TRACT

## 2018-12-10 MED ORDER — ISOSORBIDE MONONITRATE 20 MG PO TABS
10.0000 mg | ORAL_TABLET | Freq: Two times a day (BID) | ORAL | Status: DC
  Filled 2018-12-10 (×3): qty 1

## 2018-12-10 MED ORDER — CLOPIDOGREL BISULFATE 75 MG PO TABS
75.0000 mg | ORAL_TABLET | Freq: Every day | ORAL | Status: DC
  Administered 2018-12-10 – 2018-12-13 (×4): 75 mg via ORAL
  Filled 2018-12-10 (×4): qty 1

## 2018-12-10 MED ORDER — CITALOPRAM HYDROBROMIDE 20 MG PO TABS
20.0000 mg | ORAL_TABLET | Freq: Every day | ORAL | Status: DC
  Administered 2018-12-11 – 2018-12-13 (×3): 20 mg via ORAL
  Filled 2018-12-10 (×3): qty 1

## 2018-12-10 MED ORDER — SODIUM CHLORIDE 0.9 % IV SOLN: INTRAVENOUS | Status: DC

## 2018-12-10 MED ORDER — SODIUM CHLORIDE 0.9 % IV BOLUS
1000.0000 mL | Freq: Once | INTRAVENOUS | Status: AC
  Administered 2018-12-10: 1000 mL via INTRAVENOUS

## 2018-12-10 NOTE — Progress Notes (Signed)
Her HR has been in upper 40s.  Pt has been asleep but arousable.  Dr Benny Lennert made aware.  Received order to monitor her.  Idolina Primer, RN

## 2018-12-10 NOTE — Progress Notes (Signed)
Subjective:  Has had some retrosternal chest tightness this morning.  Is also having frequent loose stools.  Blood pressure has been very low and has received IV boluses.  Nursing has noticed that she is more lethargic.  Intake/Output from previous day:  No intake/output data recorded. Total I/O In: 1520.6 [P.O.:360; I.V.:1137.3; IV Piggyback:23.3] Out: -   Blood pressure 101/69, pulse (!) 55, temperature (!) 97.4 F (36.3 C), temperature source Axillary, resp. rate 16, height _0  (1.6 m), weight 40.2 kg, SpO2 94 %. Physical Exam  Constitutional: She is oriented to person, place, and time. No distress.  Very frail and chronically ill looking, petite and wasted.    HENT:  Head: Atraumatic.  Eyes: Conjunctivae are normal.  Neck: Neck supple. No JVD present. No thyromegaly present.  Cardiovascular: Normal rate, regular rhythm, normal heart sounds and intact distal pulses. Exam reveals no gallop.  No murmur heard. Carotids 2+, femorals 1-2+ bilaterally, absent popliteals, very faint DP.  Pulmonary/Chest:  Barrel-shaped chest with diffuse rhonchi and crackles bilaterally.  Abdominal: Soft. Bowel sounds are normal.  Musculoskeletal: Normal range of motion.        General: No edema.  Neurological: She is alert and oriented to person, place, and time.  Skin: Skin is warm and dry.  Psychiatric: She has a normal mood and affect.    Lab Results: BMP BNP (last 3 results) Recent Labs    12/09/18 1301 12/10/18 0618  BNP 736.6* 744.1*    ProBNP (last 3 results) No results for input(s): PROBNP in the last 8760 hours. BMP Latest Ref Rng & Units 12/10/2018 12/10/2018 12/09/2018  Glucose 70 - 99 mg/dL 71 104(H) 162(H)  BUN 8 - 23 mg/dL 35(H) 32(H) 27(H)  Creatinine 0.44 - 1.00 mg/dL 2.10(H) 2.03(H) 1.97(H)  Sodium 135 - 145 mmol/L 138 139 138  Potassium 3.5 - 5.1 mmol/L 5.8(H) 4.9 4.2  Chloride 98 - 111 mmol/L 116(H) 112(H) 110  CO2 22 - 32 mmol/L 16(L) 17(L) 15(L)  Calcium 8.9 - 10.3  mg/dL 8.6(L) 8.6(L) 10.0   Hepatic Function Latest Ref Rng & Units 06/23/2017 09/19/2016 08/26/2015  Total Protein 6.5 - 8.1 g/dL - 6.1(L) 6.4(L)  Albumin 3.5 - 5.0 g/dL 3.1(L) 2.5(L) 3.3(L)  AST 15 - 41 U/L - 18 19  ALT 14 - 54 U/L - 12(L) 12(L)  Alk Phosphatase 38 - 126 U/L - 114 123  Total Bilirubin 0.3 - 1.2 mg/dL - 0.1(L) <0.1(L)  Bilirubin, Direct 0.0 - 0.3 mg/dL - - -   CBC Latest Ref Rng & Units 12/09/2018 09/19/2017 06/23/2017  WBC 4.0 - 10.5 K/uL 7.1 6.1 7.9  Hemoglobin 12.0 - 15.0 g/dL 12.0 8.8(L) 8.4(L)  Hematocrit 36.0 - 46.0 % 35.8(L) 26.1(L) 24.8(L)  Platelets 150 - 400 K/uL 295 329 283   Lipid Panel  No results found for: CHOL, TRIG, HDL, CHOLHDL, VLDL, LDLCALC, LDLDIRECT Cardiac Panel (last 3 results) Recent Labs    12/10/18 0022 12/10/18 0618 12/10/18 1159  TROPONINI 1.03* 0.66* 0.40*    HEMOGLOBIN A1C Lab Results  Component Value Date   HGBA1C 6.6 (H) 12/10/2018   MPG 142.72 12/10/2018   TSH No results for input(s): TSH in the last 8760 hours. Imaging: Dg Chest 2 View  Result Date: 12/09/2018 CLINICAL DATA:  Chest pain. EXAM: CHEST - 2 VIEW COMPARISON:  Chest x-ray dated September 18, 2017. FINDINGS: The heart size and mediastinal contours are within normal limits. Normal pulmonary vascularity. Lungs remain hyperinflated. No focal consolidation, pleural effusion, or pneumothorax. No  acute osseous abnormality. IMPRESSION: No active cardiopulmonary disease. Electronically Signed   By: Titus Dubin M.D.   On: 12/09/2018 14:08   Dg Abd 1 View  Result Date: 12/10/2018 CLINICAL DATA:  Emesis. EXAM: ABDOMEN - 1 VIEW COMPARISON:  None. FINDINGS: Supine abdomen shows no gaseous bowel dilatation to suggest overt obstruction. Air is seen scattered along segments of the colon. Status post lumbar fusion. Right hip replacement noted. Coarse calcification over the central pelvis likely related to fibroid disease. IMPRESSION: Nonspecific bowel gas pattern. Electronically Signed    By: Misty Stanley M.D.   On: 12/10/2018 18:04   Nm Pulmonary Vent And Perf (v/q Scan)  Result Date: 12/10/2018 CLINICAL DATA:  Left substernal chest pain and shortness of breath. EXAM: NUCLEAR MEDICINE VENTILATION - PERFUSION LUNG SCAN TECHNIQUE: Ventilation images were obtained in multiple projections using inhaled aerosol Tc-[id number]. Perfusion images were obtained in multiple projections after intravenous injection of Tc-981mAA. RADIOPHARMACEUTICALS:  31.8 mCi of Tc-9919mPA aerosol inhalation and 4.37 mCi Tc99m54m IV COMPARISON:  Chest radiograph 12/09/2018 FINDINGS: Ventilation: Patchy radiotracer distribution throughout the lung parenchyma with scattered ventilation defects. Perfusion: There is a wedge shaped peripheral perfusion defects in the left mid lung, however it is matched with a ventilation defect with a similar distribution. Therefore, there is a low probability of this representing a pulmonary embolus, unless it is associated with atelectatic lung parenchyma. IMPRESSION: Wedge shaped peripheral perfusion defects in the left mid lung, which is matched with a ventilation defect with a similar distribution. Therefore, there is a low probability of this representing a pulmonary embolus, unless it is associated with atelectatic lung parenchyma. Patchy radiotracer distribution throughout the lung parenchyma, suggestive of COPD. Electronically Signed   By: DobrFidela Salisbury.   On: 12/10/2018 09:42   Dg Chest Portable 1 View  Result Date: 12/10/2018 CLINICAL DATA:  Hypoxia EXAM: PORTABLE CHEST 1 VIEW COMPARISON:  Chest radiograph from one day prior. FINDINGS: Stable cardiomediastinal silhouette with normal heart size. No pneumothorax. No pleural effusion. No pulmonary edema. Mild hazy left lung base opacity. IMPRESSION: Mild hazy left lung base opacity, which could represent atelectasis, aspiration or pneumonia. Recommend short-term follow-up PA and lateral chest radiographs.  Electronically Signed   By: JasoIlona Sorrel.   On: 12/10/2018 18:04    EKG 12/09/2018: Normal sinus rhythm at the rate of 108/m, poor R-wave progression, cannot exclude anteroseptal infarct old.  LVH with repolarization abnormality, cannot exclude inferior and lateral ischemia.  Prolonged QT interval.  Compared to 09/26/2017, the T wave inversion in the lateral leads is new.  Outpatient echocardiogram 11/30/2017: Left ventricle is normal in size, moderate LVH, normal LV systolic function, diastolic function could not be assessed due to his/effusion in the setting of first-degree AV block.  EF estimated at 55%.  Mild MR and mild TR, PA pressure 40 mmHg.  Consistent with mild-to-moderate pulmonary hypertension.  Lexiscan Myoview stress test 03/18/2017: EKG demonstrates LVH with repolarization abnormality.  Stress EKG was nondiagnostic.  Symptoms included dyspnea. Perfusion imaging study was normal without evidence of ischemia, EF 64%, low risk study.  Renal arteriogram 06/14/2017: Right renal artery is occluded, patent left renal artery.  Left femorofemoral bypass graft evident.  Carotid artery duplex 09/12/2018: Less than 50% bilateral carotid artery stenosis.  Antegrade vertebral artery flow.  Lower extremity arterial duplex and 11/06/18/20: Elevated velocity in the right external iliac artery with monophasic waveforms suggest hemodynamically significant stenosis proximally, moderately abnormal/monophasic waveform throughout the right lower extremity. Monophasic  waveform throughout the left lower extremity may indicate significant left proximal iliac artery stenosis.  Moderate velocity increase in the left mid and distal SFA suggests greater than 50% stenosis.  Bilateral ABI moderately decreased at 0.69.  ASSESSMENT AND PLAN:  1. Subendocardial myocardial infarction.  Serum troponin mildly elevated. 2.  Shortness of breath, probably related to underlying COPD 3.  Stage IV chronic kidney  disease, unilateral kidney. 4.  Peripheral arterial disease with history of either by from bypass surgery, with moderately reduced ABI recently.  2018 abdominal aortogram revealing patent graft. 5.  Resistant hypertension.  Suspect patient probably not taking medications at home, even with minimal medications, blood pressure has been very low.  Recommendation: I discussed her presentation with her son Keandra Medero, discussed with him that she is extremely high risk for sudden cardiac death and ventricular arrhythmias.  I also suspect in view of persistent diarrhea, she may go into acute renal failure on top of stage IV chronic kidney disease.  Cardiac catheterization will carry extreme high risk for complications including but not limited to renal failure and need for permanent dialysis.  She is very frail and ill-looking and cachectic.  I discussed with him regarding no CODE BLUE, and DNR status.  He agrees with this.  Would not and invasive approach.  I will start her on maintenance IV fluids in view of persistent loose stools with profound diarrhea, I will decrease metoprolol dose, continue aspirin and Plavix.  Carroll Kinds, RN present as witness regarding the code status.   Adrian Prows, M.D. 12/10/2018, 6:24 PM [facility] Pager: [phone] Office: [phone] If no answer: [phone]

## 2018-12-10 NOTE — Progress Notes (Deleted)
Patients blood pressure 69/54. Patients states feeling "fine", denies dizziness/SOB/Cp. HR 56, 100% 2L [place], RR 20. Paged MD, got orders for 1000 ml NaCl bolus. Will continue to monitor patient.

## 2018-12-10 NOTE — Progress Notes (Signed)
pts axillary temp 96.4, checked twice. Paged MD, got orders for warming blanket. Will continue to monitor patient.

## 2018-12-10 NOTE — Progress Notes (Signed)
Hypoglycemic Event  CBG: 48   Treatment: PO  Symptoms: None Denies dizziness or lightheadedness.  States tired.  Follow-up CBG: MYTR:1735 CBG Result:63 2nd Time 1722  CBG result: 95  Possible Reasons for Event: Inadequate meal intake  Insuline 2 units given around noon.  Comments/MD notified:Dr. Benny Lennert made aware.    Alyssa [name]

## 2018-12-10 NOTE — Progress Notes (Signed)
PROGRESS NOTE  Alyssa [name] BHA:[id number] DOB: 1941/06/12 DOA: 12/09/2018 PCP: Everardo Beals, NP  Brief History   The patient is a 78 yr old woman who presented to the ED on 12/09/2018 with complaints of chest pain and shortness of breath. She carries a past medical history significant for peripheral vascular disease, chronic pain syndrome, essential hypertension, hyperlipidemia, and stage IV chronic kidney disease. Her chest pain was exertional, left substernal, nonradiating chest pain that began last night and was intermittent.  Pain worse with exertion and improved with rest.  Had shortness of breath and diaphoresis with these episodes.  The episodes would improve with resting for 20 minutes, and then would return when she began to be more active.  She did have a dry cough.  Denies orthopnea.  In the ED the patient was afebrile. Blood pressure was 127/93, and heart rate was 99. Her oxygen saturations were 97% on room air.   Lab demonstrated a BNP of 736.6, Initial troponin of 1.45 which has since declined to 0.40. Sodium was 139, Potassium was 4.9, CO2 was 17and creatinine was 2.03. Hemoglobin A1c was 6.6.   The patient was admitted to a telemetry bed. She went for a VQ scan earlier this morning which was read as low probability.  This afternoon I was contacted by nursing initially to notify me that the patient's heart rate was 40 and had been 40 for 2-3 hours while the patient was sleeping. Parameters were placed on her rate limiting medications. Later the charge nurse from the unit contacted me because the patient had been sleeping and lethargic for half the day. She told me that the patient was so lethargic that she vomited on herself and did not know. The patient also had a low blood pressure. Her home antihypertensives were held.  I have ordered chemistry, ABG, an abdominal film and a chest x-ray.   When I went to go see the patient she was awake and alert. She had no complaints.  Her oxygen saturation on room air was 95. Heart rate was 55. Blood pressure was in the 90's over 60's. The patient had been incontinent of stool.  A & P  Assessment/Plan: Principal Problem:   Elevated troponin level Active Problems:   PVD (peripheral vascular disease) with claudication (HCC)   Essential hypertension   Hyperlipidemia   CKD (chronic kidney disease) stage 4, GFR 15-29 ml/min (HCC)   Chronic pain syndrome    This patient was discussed with the ED physician, including pertinent vitals, physical exam findings, labs, and imaging.  We also discussed care given by the ED provider.  1. Elevated troponin level a. EDP discussed patient with Dr. Einar Gip, who is concerned about PE.  VQ is low probability. I will stop the heparin drip.  b. Serial troponins, trending down. Most recent troponin was 0.40, down from a high of 1.45. c. Start baby aspirin daily d. Change labetalol to metoprolol. These are currently on hold due to bradycardia and hypotension. 2. Chronic kidney disease a. Appears to be at baseline b. We will IV hydrate (cautiously). 3. Hypertension a. Antihypertensives held due to hypotension. Monitor and restart as appropriate. 4. Hyperlipidemia a. Continue statin 5. Peripheral vascular disease: Noted.  6. Chronic pain a. On pain medicine regimen  DVT prophylaxis: Full dose heparin Consultants: Cardiology Code Status: Full code Family Communication: None Disposition Plan: Should be able to return home following admission   Xzander Gilham, DO [facility] Direct contact: see [email or web address]  7PM-7AM contact night  coverage as above 12/10/2018, 4:14 PM  LOS: 1 day   Consultants  . Cardiology  Procedures  . V/Q scan: Low probability for pulmonary embolus.  Interval History/Subjective  The patient is awake and alert. She has no new complaints. She says that her breathing was better.  Objective   Vitals:  Vitals:   12/10/18 1500 12/10/18 1514  BP:   92/71  Pulse:  (!) 48  Resp:    Temp: (!) 97.4 F (36.3 C)   SpO2:  100%    Exam:  Constitutional:  . The patient is awake, alert, and oriented x 3. No acute distress. Eyes:  . pupils and irises appear normal . Normal lids and conjunctivae Respiratory:  . No rales or rhonchi. Positive for wheezes. . No tactile fremitus. . No increased work of breathing. Cardiovascular:  . Regular rate or rhythm. No murmur, ectopy, or gallup. . No LE extremity edema   . Normal pedal pulses Abdomen:  . Abdomen is soft, non-tender, non-distended. . No hernias . No HSM Musculoskeletal:  . No cyanosis, clubbing, or edema Skin:  . No rashes, lesions, ulcers . palpation of skin: no induration or nodules Neurologic:  . CN 2-12 intact . Sensation all 4 extremities intact Psychiatric:  . Mental status o Mood, affect appropriate o Orientation to person, place, time  . judgment and insight appear intact     I have personally reviewed the following:   Today's Data  . V/Q scan, EKG, troponins, CBC, BMP, vitals.  Scheduled Meds: . aspirin EC  81 mg Oral Daily  . [START ON 12/11/2018] citalopram  20 mg Oral Daily  . clonazePAM  1 mg Oral BID  . cloNIDine  0.1 mg Oral TID  . clopidogrel  75 mg Oral Daily  . [START ON 12/11/2018] ferrous sulfate  325 mg Oral Q M,W,F  . gabapentin  300 mg Oral TID  . insulin aspart  0-15 Units Subcutaneous TID WC  . insulin aspart  0-5 Units Subcutaneous QHS  . isosorbide mononitrate  10 mg Oral BID  . lithium carbonate  300 mg Oral Daily  . metoprolol tartrate  25 mg Oral BID  . pantoprazole  40 mg Oral Daily  . rosuvastatin  10 mg Oral Daily  . sodium chloride flush  3 mL Intravenous Once  . temazepam  30 mg Oral QHS   Continuous Infusions: . sodium chloride 100 mL/hr at 12/10/18 1552    Principal Problem:   Elevated troponin level Active Problems:   PVD (peripheral vascular disease) with claudication (HCC)   Essential hypertension    Hyperlipidemia   CKD (chronic kidney disease) stage 4, GFR 15-29 ml/min (HCC)   Chronic pain syndrome   LOS: 1 day

## 2018-12-10 NOTE — Progress Notes (Signed)
RT attempted ABG x2 and was unsuccessful. Another RT was called to attempt. RT will continue to monitor as needed.

## 2018-12-10 NOTE — Progress Notes (Signed)
Patients blood pressure 69/54. Patients states feeling "fine", denies dizziness/SOB/Cp. HR 56, 100% 2L [place], RR 20. Paged MD, got orders for 1000 ml NaCl bolus. Will continue to monitor patient.

## 2018-12-10 NOTE — Progress Notes (Signed)
ANTICOAGULATION CONSULT NOTE - Follow Up Consult  Pharmacy Consult for heparin Indication: r/o ACS vs r/o PE  Labs: Recent Labs    12/09/18 1301 12/09/18 1723 12/10/18 0022  HGB 12.0  --   --   HCT 35.8*  --   --   PLT 295  --   --   HEPARINUNFRC  --   --  0.62  CREATININE 1.97*  --   --   TROPONINI  --  1.45*  --     Assessment/Plan:  79yo female therapeutic on heparin with initial dosing for ACS, also awaiting VQ scan for possible PE. Will continue gtt at current rate and confirm stable with additional level.   Wynona Neat, PharmD, BCPS  12/10/2018,1:01 AM

## 2018-12-10 NOTE — ED Provider Notes (Addendum)
[facility]E PROGRESSIVE CARE Provider Note   CSN: [id number] Arrival date & time: 12/09/18  1241    History   Chief Complaint Chief Complaint  Patient presents with  . Shortness of Breath  . Leg Pain    HPI Alyssa [name] is a 78 y.o. female.     Patient complains of shortness of breath and chest pain  The history is provided by the patient. No language interpreter was used.  Shortness of Breath  Severity:  Moderate Onset quality:  Sudden Timing:  Constant Progression:  Worsening Chronicity:  New Context: activity   Relieved by:  Nothing Worsened by:  Nothing Ineffective treatments:  None tried Associated symptoms: chest pain   Associated symptoms: no abdominal pain, no cough, no headaches and no rash   Leg Pain  Associated symptoms: no back pain and no fatigue     Past Medical History:  Diagnosis Date  . Anxiety   . Arthritis   . Chronic kidney disease   . Headache(784.0)   . Hyperlipidemia   . Hypertension   . Joint pain   . Leg pain   . Peripheral neuropathy   . Peripheral vascular disease [facility])     Patient Active Problem List   Diagnosis Date Noted  . Elevated troponin level 12/09/2018  . Chronic migraine 03/27/2018  . Lumbar radiculopathy 03/27/2018  . Gait abnormality 03/27/2018  . CKD (chronic kidney disease) stage 4, GFR 15-29 ml/min (HCC) 12/28/2016  . Normocytic anemia 12/28/2016  . Chronic pain syndrome 12/28/2016  . Benzodiazepine withdrawal ([place]) 12/28/2016  . Benzodiazepine withdrawal without complication ([place]) 40/98/1191  . Chronic right shoulder pain 12/06/2016  . Fall 09/17/2016  . Fracture of humeral head, closed, right, initial encounter 09/17/2016  . Rib fractures 09/17/2016  . Multiple falls 09/17/2016  . Severe muscle deconditioning 09/17/2016  . Failure to thrive in adult 09/17/2016  . Melena 04/25/2016  . Essential hypertension 04/25/2016  . Anxiety state 04/25/2016  . Tobacco abuse 04/25/2016  .  Hyperlipidemia 04/25/2016  . Syncope 04/24/2016  . Syncope and collapse 04/24/2016  . Gait difficulty 01/12/2016  . Foot drop, left 01/12/2016  . Severe recurrent major depression without psychotic features ([place]) 08/28/2015  . Spondylolisthesis at L4-L5 level 07/30/2015  . PVD (peripheral vascular disease) with claudication ([place]) 07/29/2014  . Weakness-Bilateral arm/leg 07/29/2014  . Numbness-left leg 07/29/2014  . Swelling of limb-Legs 07/29/2014  . Atherosclerosis of native arteries of the extremities with intermittent claudication 09/30/2011    Past Surgical History:  Procedure Laterality Date  . ABDOMINAL ANGIOGRAM N/A 10/29/2011   Procedure: ABDOMINAL ANGIOGRAM;  Surgeon: Elam Dutch, MD;  Location: [facility] CATH LAB;  Service: Cardiovascular;  Laterality: N/A;  . BACK SURGERY    . FEMORAL-FEMORAL BYPASS GRAFT  08/31/2010  . JOINT REPLACEMENT Right 2012   Hip  . LOWER EXTREMITY ANGIOGRAPHY  06/14/2017   Procedure: Lower Extremity Angiography;  Surgeon: Adrian Prows, MD;  Location: [facility] CV LAB;  Service: Cardiovascular;;  Bilateral limited angio performed  . RENAL ANGIOGRAPHY N/A 06/14/2017   Procedure: Renal Angiography;  Surgeon: Adrian Prows, MD;  Location: [facility] CV LAB;  Service: Cardiovascular;  Laterality: N/A;  . TOTAL HIP ARTHROPLASTY     right     OB History   No obstetric history on file.      Home Medications    Prior to Admission medications   Medication Sig Start Date End Date Taking? Authorizing Provider  amLODipine (NORVASC) 10 MG tablet Take  1 tablet (10 mg total) by mouth daily. 12/29/16  Yes Robbie Lis, MD  citalopram (CELEXA) 40 MG tablet Take 1 tablet (40 mg total) by mouth daily. 08/30/15  Yes Delfin Gant, NP  clonazePAM (KLONOPIN) 1 MG tablet Take 1 tablet (1 mg total) by mouth 2 (two) times daily. Patient taking differently: Take by mouth 2 (two) times daily.  12/28/16  Yes Robbie Lis, MD  cloNIDine (CATAPRES) 0.1 MG tablet  Take 1 tablet (0.1 mg total) by mouth 3 (three) times daily. 06/24/17  Yes Patrecia Pour, MD  ergocalciferol (VITAMIN D2) 50000 units capsule Take 50,000 Units by mouth every Monday.    Yes [provider]  ferrous sulfate 325 (65 FE) MG tablet Take 1 tablet (325 mg total) by mouth every Monday, Wednesday, and Friday. With breakfast 09/20/16  Yes Florencia Reasons, MD  gabapentin (NEURONTIN) 300 MG capsule Take 1 capsule by mouth 3 (three) times daily. 05/16/17  Yes [provider]  HYDROcodone-acetaminophen (NORCO/VICODIN) 5-325 MG tablet Take 1 tablet by mouth every 4 (four) hours as needed for moderate pain.    Yes [provider]  labetalol (NORMODYNE) 300 MG tablet Take 1 tablet by mouth 2 (two) times daily. 06/05/17  Yes [provider]  lithium 300 MG tablet Take 300 mg by mouth daily.   Yes [provider]  Multiple Vitamin (MULTIVITAMIN WITH MINERALS) TABS tablet Take 1 tablet by mouth daily. One a Day Women's   Yes [provider]  omeprazole (PRILOSEC) 40 MG capsule Take 1 capsule (40 mg total) by mouth daily. 02/08/18  Yes Ladene Artist, MD  rosuvastatin (CRESTOR) 10 MG tablet Take 10 mg by mouth daily.    Yes [provider]  temazepam (RESTORIL) 30 MG capsule Take 30 mg by mouth at bedtime.   Yes [provider]  traMADol (ULTRAM) 50 MG tablet Take 1 tablet (50 mg total) by mouth every 6 (six) hours as needed. Patient taking differently: Take 50 mg by mouth every 6 (six) hours as needed for moderate pain.  11/29/17  Yes Marcial Pacas, MD    Family History Family History  Problem Relation Age of Onset  . Heart attack Mother   . Heart disease Mother   . Hyperlipidemia Mother   . Hypertension Mother   . Heart attack Father   . Heart disease Father        Before age 26  . Hyperlipidemia Father   . Hypertension Father   . Cancer Sister        brain tumor  . Aneurysm Brother        brain  . Colon cancer Neg Hx   . Liver  cancer Neg Hx     Social History Social History   Tobacco Use  . Smoking status: Light Tobacco Smoker    Packs/day: 0.25    Types: Cigarettes  . Smokeless tobacco: Never Used  . Tobacco comment: 5 cigs a day   Substance Use Topics  . Alcohol use: No  . Drug use: No     Allergies   Patient has no known allergies.   Review of Systems Review of Systems  Constitutional: Negative for appetite change and fatigue.  HENT: Negative for congestion, ear discharge and sinus pressure.   Eyes: Negative for discharge.  Respiratory: Positive for shortness of breath. Negative for cough.   Cardiovascular: Positive for chest pain.  Gastrointestinal: Negative for abdominal pain and diarrhea.  Genitourinary: Negative for frequency  and hematuria.  Musculoskeletal: Negative for back pain.  Skin: Negative for rash.  Neurological: Negative for seizures and headaches.  Psychiatric/Behavioral: Negative for hallucinations.     Physical Exam Updated Vital Signs BP (!) 88/57 (BP Location: Right Arm)   Pulse (!) 59   Temp 98.3 F (36.8 C) (Oral)   Resp 16   Ht 5\' 3"  (1.6 m)   Wt 40.2 kg   SpO2 98%   BMI 15.71 kg/m   Physical Exam Vitals signs and nursing note reviewed.  Constitutional:      Appearance: She is well-developed.  HENT:     Head: Normocephalic.     Nose: Nose normal.  Eyes:     General: No scleral icterus.    Conjunctiva/sclera: Conjunctivae normal.  Neck:     Musculoskeletal: Neck supple.     Thyroid: No thyromegaly.  Cardiovascular:     Rate and Rhythm: Normal rate and regular rhythm.     Heart sounds: No murmur. No friction rub. No gallop.   Pulmonary:     Breath sounds: No stridor. No wheezing or rales.  Chest:     Chest wall: No tenderness.  Abdominal:     General: There is no distension.     Tenderness: There is no abdominal tenderness. There is no rebound.  Musculoskeletal: Normal range of motion.  Lymphadenopathy:     Cervical: No cervical adenopathy.    Skin:    Findings: No erythema or rash.  Neurological:     Mental Status: She is oriented to person, place, and time.     Motor: No abnormal muscle tone.     Coordination: Coordination normal.  Psychiatric:        Behavior: Behavior normal.      ED Treatments / Results  Labs (all labs ordered are listed, but only abnormal results are displayed) Labs Reviewed  BASIC METABOLIC PANEL - Abnormal; Notable for the following components:      Result Value   CO2 15 (*)    Glucose, Bld 162 (*)    BUN 27 (*)    Creatinine, Ser 1.97 (*)    GFR calc non Af Amer 24 (*)    GFR calc Af Amer 28 (*)    All other components within normal limits  CBC - Abnormal; Notable for the following components:   HCT 35.8 (*)    All other components within normal limits  TROPONIN I - Abnormal; Notable for the following components:   Troponin I 1.45 (*)    All other components within normal limits  TROPONIN I - Abnormal; Notable for the following components:   Troponin I 1.03 (*)    All other components within normal limits  GLUCOSE, CAPILLARY - Abnormal; Notable for the following components:   Glucose-Capillary 102 (*)    All other components within normal limits  BRAIN NATRIURETIC PEPTIDE - Abnormal; Notable for the following components:   B Natriuretic Peptide 736.6 (*)    All other components within normal limits  GLUCOSE, CAPILLARY - Abnormal; Notable for the following components:   Glucose-Capillary 105 (*)    All other components within normal limits  I-STAT TROPONIN, ED - Abnormal; Notable for the following components:   Troponin i, poc 1.69 (*)    All other components within normal limits  HEPARIN LEVEL (UNFRACTIONATED)  TROPONIN I  BASIC METABOLIC PANEL  HEMOGLOBIN A1C  BRAIN NATRIURETIC PEPTIDE  TROPONIN I  HEPARIN LEVEL (UNFRACTIONATED)    EKG EKG Interpretation  Date/Time:  Saturday December 09 2018 12:51:13 EST Ventricular Rate:  108 PR Interval:    QRS Duration: 78 QT  Interval:  427 QTC Calculation: 573 R Axis:   83 Text Interpretation:  Sinus tachycardia Borderline right axis deviation Probable LVH with secondary repol abnrm Abnormal T, probable ischemia, lateral leads ST elevation, consider inferior injury Prolonged QT interval Confirmed by Milton Ferguson [phone]) on 12/09/2018 1:25:09 PM   Radiology Dg Chest 2 View  Result Date: 12/09/2018 CLINICAL DATA:  Chest pain. EXAM: CHEST - 2 VIEW COMPARISON:  Chest x-ray dated September 18, 2017. FINDINGS: The heart size and mediastinal contours are within normal limits. Normal pulmonary vascularity. Lungs remain hyperinflated. No focal consolidation, pleural effusion, or pneumothorax. No acute osseous abnormality. IMPRESSION: No active cardiopulmonary disease. Electronically Signed   By: Titus Dubin M.D.   On: 12/09/2018 14:08    Procedures Procedures (including critical care time)  Medications Ordered in ED Medications  sodium chloride flush (NS) 0.9 % injection 3 mL (3 mLs Intravenous Not Given 12/09/18 1742)  nitroGLYCERIN (NITROSTAT) SL tablet 0.4 mg (0.4 mg Sublingual Given 12/09/18 1429)  heparin ADULT infusion 100 units/mL (25000 units/267mL sodium chloride 0.45%) (550 Units/hr Intravenous Transfusing/Transfer 12/09/18 1624)  aspirin EC tablet 81 mg (has no administration in time range)  nitroGLYCERIN (NITROSTAT) SL tablet 0.4 mg (has no administration in time range)  acetaminophen (TYLENOL) tablet 650 mg (has no administration in time range)  ondansetron (ZOFRAN) injection 4 mg (has no administration in time range)  0.9 %  sodium chloride infusion ( Intravenous New Bag/Given 12/09/18 1738)  metoprolol tartrate (LOPRESSOR) tablet 25 mg (25 mg Oral Given 12/09/18 2211)  insulin aspart (novoLOG) injection 0-15 Units (0 Units Subcutaneous Not Given 12/09/18 1725)  insulin aspart (novoLOG) injection 0-5 Units (0 Units Subcutaneous Not Given 12/09/18 2206)  traMADol (ULTRAM) tablet 50 mg (has no administration  in time range)  amLODipine (NORVASC) tablet 10 mg (10 mg Oral Given 12/09/18 1742)  cloNIDine (CATAPRES) tablet 0.1 mg (0.1 mg Oral Given 12/09/18 2206)  rosuvastatin (CRESTOR) tablet 10 mg (10 mg Oral Given 12/09/18 1742)  citalopram (CELEXA) tablet 40 mg (40 mg Oral Given 12/09/18 1742)  lithium carbonate capsule 300 mg (300 mg Oral Given 12/09/18 1742)  temazepam (RESTORIL) capsule 30 mg (30 mg Oral Given 12/09/18 2208)  pantoprazole (PROTONIX) EC tablet 40 mg (40 mg Oral Given 12/09/18 1742)  ferrous sulfate tablet 325 mg (has no administration in time range)  clonazePAM (KLONOPIN) tablet 1 mg (1 mg Oral Given 12/09/18 2205)  gabapentin (NEURONTIN) capsule 300 mg (300 mg Oral Given 12/09/18 2206)  isosorbide-hydrALAZINE (BIDIL) 20-37.5 MG per tablet 1 tablet (1 tablet Oral Given 12/09/18 2206)  morphine 2 MG/ML injection 2 mg (2 mg Intravenous Given 12/09/18 1409)  aspirin chewable tablet 324 mg (324 mg Oral Given 12/09/18 1408)  heparin bolus via infusion 2,500 Units (2,500 Units Intravenous Bolus from Bag 12/09/18 1616)  sodium chloride 0.9 % bolus 1,000 mL (1,000 mLs Intravenous New Bag/Given 12/10/18 0040)     Initial Impression / Assessment and Plan / ED Course  I have reviewed the triage vital signs and the nursing notes.  Pertinent labs & imaging results that were available during my care of the patient were reviewed by me and considered in my medical decision making (see chart for details).    CRITICAL CARE Performed by: Milton Ferguson Total critical care time: 40 minutes Critical care time was exclusive of separately billable procedures and treating other patients. Critical care was  necessary to treat or prevent imminent or life-threatening deterioration. Critical care was time spent personally by me on the following activities: development of treatment plan with patient and/or surrogate as well as nursing, discussions with consultants, evaluation of patient's response to treatment,  examination of patient, obtaining history from patient or surrogate, ordering and performing treatments and interventions, ordering and review of laboratory studies, ordering and review of radiographic studies, pulse oximetry and re-evaluation of patient's condition.     Patient with shortness of breath chest pain.  Troponin elevated.  I spoke with her cardiologist Dr. Einar Gip and he recommended medicine admission evaluation for PE and he will evaluate patient  Final Clinical Impressions(s) / ED Diagnoses   Final diagnoses:  Chest pain    ED Discharge Orders    None       Milton Ferguson, MD 12/10/18 6389    Milton Ferguson, MD 12/24/18 [phone]

## 2018-12-10 NOTE — Progress Notes (Signed)
BP dropped to 77/60 with HR 48 .  Pt is asleep. Able to wake her up.  Held Isosorbide mononitrate.    Dr. Benny Lennert made aware.  Alyssa [name]

## 2018-12-10 NOTE — Progress Notes (Signed)
ANTICOAGULATION CONSULT NOTE - Follow-Up Consult  Pharmacy Consult for heparin Indication: chest pain/ACS  No Known Allergies  Patient Measurements: Height: 5\' 3"  (160 cm) Weight: 88 lb 11.2 oz (40.2 kg) IBW/kg (Calculated) : 52.4 Heparin Dosing Weight: 44.2 kg  Vital Signs: Temp: 97.5 F (36.4 C) (02/23 0907) Temp Source: Oral (02/23 0907) BP: 114/79 (02/23 0907) Pulse Rate: 58 (02/23 0907)  Labs: Recent Labs    12/09/18 1301 12/09/18 1723 12/10/18 0022 12/10/18 0618  HGB 12.0  --   --   --   HCT 35.8*  --   --   --   PLT 295  --   --   --   HEPARINUNFRC  --   --  0.62 0.69  CREATININE 1.97*  --   --  2.03*  TROPONINI  --  1.45* 1.03* 0.66*    Estimated Creatinine Clearance: 15.2 mL/min (A) (by C-G formula based on SCr of 2.03 mg/dL (H)).   Medical History: Past Medical History:  Diagnosis Date  . Anxiety   . Arthritis   . Chronic kidney disease   . Headache(784.0)   . Hyperlipidemia   . Hypertension   . Joint pain   . Leg pain   . Peripheral neuropathy   . Peripheral vascular disease ([place])     Assessment: 72 yoF admitted with CP and SOB. Pharmacy consulted for heparin for ACS r/o vs PE. VQ scan low probability of PE, trops now trending down. Heparin level therapeutic at 0.69 but borderline above goal.   Goal of Therapy:  Heparin level 0.3-0.7 units/ml Monitor platelets by anticoagulation protocol: Yes   Plan:  -Reduce heparin to 500 units/hr -Daily heparin level and CBC  Arrie Senate, PharmD, BCPS Clinical Pharmacist [phone] Please check AMION for all [place] numbers 12/10/2018

## 2018-12-10 NOTE — Progress Notes (Signed)
Pt had diarrhea x4 this afternoon. Dr. Einar Gip made aware.   Idolina Primer, RN

## 2018-12-10 NOTE — Progress Notes (Signed)
Informed Dr. Benny Lennert of her low BP episode last night and requested to adjust her BP medications. Received order to stop amlodipine and bidil.  New order for isosorbide mononitrate.  Idolina Primer, RN

## 2018-12-10 NOTE — Progress Notes (Signed)
Pt lethargic. Face puffy, nasal canula making indentions in face. Pt has not voided today. Bladder scan 200. Pt had incontinence episode of liquid stool and was unaware she had BM. Paged Dr. Benny Lennert. SBP remains low 90's. Continue to monitor. Carroll Kinds RN

## 2018-12-11 ENCOUNTER — Inpatient Hospital Stay (HOSPITAL_COMMUNITY): Payer: Medicare HMO

## 2018-12-11 DIAGNOSIS — R079 Chest pain, unspecified: Secondary | ICD-10-CM

## 2018-12-11 DIAGNOSIS — R0789 Other chest pain: Secondary | ICD-10-CM

## 2018-12-11 DIAGNOSIS — I739 Peripheral vascular disease, unspecified: Secondary | ICD-10-CM

## 2018-12-11 DIAGNOSIS — I219 Acute myocardial infarction, unspecified: Secondary | ICD-10-CM

## 2018-12-11 LAB — GLUCOSE, CAPILLARY
Glucose-Capillary: 115 mg/dL — ABNORMAL HIGH (ref 70–99)
Glucose-Capillary: 132 mg/dL — ABNORMAL HIGH (ref 70–99)
Glucose-Capillary: 144 mg/dL — ABNORMAL HIGH (ref 70–99)
Glucose-Capillary: 137 mg/dL — ABNORMAL HIGH (ref 70–99)
Glucose-Capillary: 143 mg/dL — ABNORMAL HIGH (ref 70–99)

## 2018-12-11 LAB — CBC
HCT: 31.2 % — ABNORMAL LOW (ref 36.0–46.0)
Hemoglobin: 9.9 g/dL — ABNORMAL LOW (ref 12.0–15.0)
MCH: 27.4 pg (ref 26.0–34.0)
MCHC: 31.7 g/dL (ref 30.0–36.0)
MCV: 86.4 fL (ref 80.0–100.0)
Platelets: 229 10*3/uL (ref 150–400)
RBC: 3.61 MIL/uL — ABNORMAL LOW (ref 3.87–5.11)
RDW: 14.6 % (ref 11.5–15.5)
WBC: 9.3 10*3/uL (ref 4.0–10.5)
nRBC: 0 % (ref 0.0–0.2)

## 2018-12-11 LAB — BRAIN NATRIURETIC PEPTIDE: B Natriuretic Peptide: 471.1 pg/mL — ABNORMAL HIGH (ref 0.0–100.0)

## 2018-12-11 LAB — BASIC METABOLIC PANEL
Anion gap: 6 (ref 5–15)
BUN: 33 mg/dL — ABNORMAL HIGH (ref 8–23)
CO2: 17 mmol/L — ABNORMAL LOW (ref 22–32)
Calcium: 8.6 mg/dL — ABNORMAL LOW (ref 8.9–10.3)
Chloride: 114 mmol/L — ABNORMAL HIGH (ref 98–111)
Creatinine, Ser: 1.79 mg/dL — ABNORMAL HIGH (ref 0.44–1.00)
GFR calc Af Amer: 32 mL/min — ABNORMAL LOW (ref >60)
GFR calc non Af Amer: 27 mL/min — ABNORMAL LOW (ref >60)
Glucose, Bld: 125 mg/dL — ABNORMAL HIGH (ref 70–99)
Potassium: 4.7 mmol/L (ref 3.5–5.1)
Sodium: 137 mmol/L (ref 135–145)

## 2018-12-11 LAB — ECHOCARDIOGRAM COMPLETE
Height: 63 in
Weight: 1419.2 oz

## 2018-12-11 LAB — C DIFFICILE QUICK SCREEN W PCR REFLEX
C Diff antigen: NEGATIVE
C Diff interpretation: NOT DETECTED
C Diff toxin: NEGATIVE

## 2018-12-11 MED ORDER — ALBUTEROL SULFATE (2.5 MG/3ML) 0.083% IN NEBU
2.5000 mg | INHALATION_SOLUTION | RESPIRATORY_TRACT | Status: DC | PRN
  Administered 2018-12-11: 2.5 mg via RESPIRATORY_TRACT
  Filled 2018-12-11: qty 3

## 2018-12-11 MED ORDER — HEPARIN SODIUM (PORCINE) 5000 UNIT/ML IJ SOLN
5000.0000 [IU] | Freq: Three times a day (TID) | INTRAMUSCULAR | Status: DC
  Administered 2018-12-11 – 2018-12-13 (×5): 5000 [IU] via SUBCUTANEOUS
  Filled 2018-12-11 (×5): qty 1

## 2018-12-11 MED ORDER — LEVALBUTEROL HCL 0.63 MG/3ML IN NEBU
0.6300 mg | INHALATION_SOLUTION | Freq: Three times a day (TID) | RESPIRATORY_TRACT | Status: DC
  Administered 2018-12-11 – 2018-12-13 (×7): 0.63 mg via RESPIRATORY_TRACT
  Filled 2018-12-11 (×8): qty 3

## 2018-12-11 MED ORDER — LEVALBUTEROL HCL 0.63 MG/3ML IN NEBU
0.6300 mg | INHALATION_SOLUTION | Freq: Three times a day (TID) | RESPIRATORY_TRACT | Status: DC | PRN
  Administered 2018-12-11: 0.63 mg via RESPIRATORY_TRACT
  Filled 2018-12-11: qty 3

## 2018-12-11 MED ORDER — ALBUTEROL SULFATE (2.5 MG/3ML) 0.083% IN NEBU: 2.5000 mg | INHALATION_SOLUTION | RESPIRATORY_TRACT | Status: DC | PRN

## 2018-12-11 MED ORDER — ISOSORBIDE MONONITRATE ER 30 MG PO TB24
30.0000 mg | ORAL_TABLET | Freq: Every day | ORAL | Status: DC
  Administered 2018-12-11 – 2018-12-13 (×3): 30 mg via ORAL
  Filled 2018-12-11 (×3): qty 1

## 2018-12-11 MED ORDER — BENZONATATE 100 MG PO CAPS: 200.0000 mg | ORAL_CAPSULE | Freq: Two times a day (BID) | ORAL | Status: DC | PRN

## 2018-12-11 MED ORDER — GUAIFENESIN 100 MG/5ML PO SOLN
5.0000 mL | ORAL | Status: DC | PRN
  Administered 2018-12-11: 100 mg via ORAL
  Filled 2018-12-11: qty 5

## 2018-12-11 NOTE — Progress Notes (Signed)
  Echocardiogram 2D Echocardiogram has been performed.  12/11/2018, 10:14 AM

## 2018-12-11 NOTE — Progress Notes (Signed)
Late Note: Rechecked patients temperature, 97.4 rectally. Warming blanket at bedside if needed. Will continue to monitor patient closely.

## 2018-12-11 NOTE — Progress Notes (Signed)
Pt stated having 8/10 chest pain, given nitro sublingual x3. Pt rated chest pain a 2/10 after medication. Pt denies sob/ weakness. BP 119/77, HR 72, RR 23.   Placed on 2L O2 [place]. Will continue to monitor patient closely.

## 2018-12-11 NOTE — Progress Notes (Signed)
Alyssa [name] TEAM 1 - Stepdown/ICU TEAM  Alyssa [name]  NAT:[id number] DOB: [birth year] DOA: 12/09/2018 PCP: Everardo Beals, NP    Brief Narrative:  78yo w/ a hx of peripheral vascular disease, chronic pain syndrome, essential hypertension, hyperlipidemia, and stage IV chronic kidney disease who presented to the ED on 12/09/2018 with exertional chest pain and shortness of breath.   Significant Events: 2/22 admit   Subjective: The patient is resting comfortably in bed at the time of my exam.  She states she feels much better overall.  She denies current chest pain nausea vomiting or shortness of breath.  It appears her diarrhea has slowed as well.  Assessment & Plan:  Elevated troponin level VQ is low prob - serial troponin trended down from peak of 1.45 - conservative med management recommended per Cardiology   LLL airspace process CXR suggests ATX v/s PNA - no clinical evidence to convince me she has PNA presently - hold abx for now   Hypotension BP is now stable   Hypoglycemia CBG appears to have stabilized for now - A1c 6.6 therefore c/w DM, though she does not carry this diagnosis - hold SSI for now and monitor CBG trend   Persistent Diarrhea   C diff negative - may be slowing at this time - suspect a viral etiology   Chronic kidney disease Stage IV - unilateral kidney  crt appears to be at baseline  Hyperlipidemia  Peripheral vascular disease status post femorofemoral bypass  Chronic pain  Normocytic anemia Likely due to CKD - follow to assure Hgb stabilizes after drop from hydration    DVT prophylaxis: SQ  Heparin  Code Status: DNR - NO CODE Family Communication: no family present at time of exam  Disposition Plan: transfer to tele bed - begin therapy evals  Consultants:  Cardiology   Antimicrobials:  none   Objective: Blood pressure 110/67, pulse 75, temperature 98.4 F (36.9 C), temperature source Oral, resp. rate 18, height 5\' 3"  (1.6 m),  weight 40.2 kg, SpO2 96 %.  Intake/Output Summary (Last 24 hours) at 12/11/2018 1519 Last data filed at 12/11/2018 1434 Gross per 24 hour  Intake 1520.58 ml  Output 600 ml  Net 920.58 ml   Filed Weights   12/09/18 1248 12/09/18 1645 12/10/18 0505  Weight: 44.2 kg 40.2 kg 40.2 kg    Examination: General: No acute respiratory distress Lungs: Clear to auscultation bilaterally without wheezes or crackles Cardiovascular: Regular rate and rhythm without murmur  Abdomen: Nontender, nondistended, soft, bowel sounds positive, no rebound, no ascites, no appreciable mass Extremities: No significant cyanosis, clubbing, or edema bilateral lower extremities  CBC: Recent Labs  Lab 12/09/18 1301 12/11/18 0424  WBC 7.1 9.3  HGB 12.0 9.9*  HCT 35.8* 31.2*  MCV 84.6 86.4  PLT 295 427   Basic Metabolic Panel: Recent Labs  Lab 12/10/18 0618 12/10/18 1719 12/11/18 0424  NA 139 138 137  K 4.9 5.8* 4.7  CL 112* 116* 114*  CO2 17* 16* 17*  GLUCOSE 104* 71 125*  BUN 32* 35* 33*  CREATININE 2.03* 2.10* 1.79*  CALCIUM 8.6* 8.6* 8.6*   GFR: Estimated Creatinine Clearance: 17.2 mL/min (A) (by C-G formula based on SCr of 1.79 mg/dL (H)).  Liver Function Tests: No results for input(s): AST, ALT, ALKPHOS, BILITOT, PROT, ALBUMIN in the last 168 hours. No results for input(s): LIPASE, AMYLASE in the last 168 hours. No results for input(s): AMMONIA in the last 168 hours.  Coagulation Profile: No results for  input(s): INR, PROTIME in the last 168 hours.  Cardiac Enzymes: Recent Labs  Lab 12/09/18 1723 12/10/18 0022 12/10/18 0618 12/10/18 1159  TROPONINI 1.45* 1.03* 0.66* 0.40*    HbA1C: Hgb A1c MFr Bld  Date/Time Value Ref Range Status  12/10/2018 06:18 AM 6.6 (H) 4.8 - 5.6 % Final    Comment:    (NOTE) Pre diabetes:          5.7%-6.4% Diabetes:              >6.4% Glycemic control for   <7.0% adults with diabetes   09/19/2016 02:42 AM 5.9 (H) 4.8 - 5.6 % Final    Comment:     (NOTE)         Pre-diabetes: 5.7 - 6.4         Diabetes: >6.4         Glycemic control for adults with diabetes: <7.0     CBG: Recent Labs  Lab 12/10/18 1722 12/10/18 2027 12/11/18 0420 12/11/18 0818 12/11/18 1220  GLUCAP 95 128* 115* 132* 144*    Recent Results (from the past 240 hour(s))  C difficile quick scan w PCR reflex     Status: None   Collection Time: 12/11/18 12:33 AM  Result Value Ref Range Status   C Diff antigen NEGATIVE NEGATIVE Final   C Diff toxin NEGATIVE NEGATIVE Final   C Diff interpretation No C. difficile detected.  Final     Scheduled Meds: . aspirin EC  81 mg Oral Daily  . citalopram  20 mg Oral Daily  . clonazePAM  1 mg Oral BID  . clopidogrel  75 mg Oral Daily  . ferrous sulfate  325 mg Oral Q M,W,F  . gabapentin  300 mg Oral TID  . insulin aspart  0-15 Units Subcutaneous TID WC  . insulin aspart  0-5 Units Subcutaneous QHS  . isosorbide mononitrate  30 mg Oral Daily  . levalbuterol  0.63 mg Nebulization TID  . lithium carbonate  300 mg Oral Daily  . metoprolol tartrate  25 mg Oral BID  . pantoprazole  40 mg Oral Daily  . rosuvastatin  10 mg Oral Daily  . sodium chloride flush  3 mL Intravenous Once  . temazepam  30 mg Oral QHS    LOS: 2 days   Cherene Altes, MD [facility] Office  [phone] Pager - Text Page per Amion  If 7PM-7AM, please contact night-coverage per Amion 12/11/2018, 3:19 PM

## 2018-12-11 NOTE — Progress Notes (Signed)
Subjective:  Chest pain and shortness of breath better. Has diarrhea  Objective:  Vital Signs in the last 24 hours: Temp:  [96.1 F (35.6 C)-98.6 F (37 C)] 98.6 F (37 C) (02/24 0310) Pulse Rate:  [48-78] 75 (02/24 0259) Resp:  [16] 16 (02/23 2024) BP: (77-135)/(60-86) 119/77 (02/24 0259) SpO2:  [92 %-100 %] 98 % (02/24 0410)  Intake/Output from previous day: 02/23 0701 - 02/24 0700 In: 1520.6 [P.O.:360; I.V.:1137.3; IV Piggyback:23.3] Out: 600 [Urine:600]  Physical Exam Constitutional: She is oriented to person, place, and time. No distress.  Very frail and chronically ill looking, petite  HENT:  Head: Atraumatic.  Eyes: Conjunctivae are normal.  Neck: Neck supple. No JVD present. No thyromegaly present.  Cardiovascular: Normal rate, regular rhythm, normal heart sounds and intact distal pulses. Exam reveals no gallop.  No murmur heard. Carotids 2+, femorals 1-2+ bilaterally, absent popliteals, very faint DP.  Pulmonary/Chest:  Barrel-shaped chest with diffuse rhonchi and crackles bilaterally.  Abdominal: Soft. Bowel sounds are normal.  Musculoskeletal: Normal range of motion.        General: No edema.  Neurological: She is alert and oriented to person, place, and time.  Skin: Skin is warm and dry.  Psychiatric: She has a normal mood and affect.   Lab Results: BMP Recent Labs    12/10/18 0618 12/10/18 1719 12/11/18 0424  NA 139 138 137  K 4.9 5.8* 4.7  CL 112* 116* 114*  CO2 17* 16* 17*  GLUCOSE 104* 71 125*  BUN 32* 35* 33*  CREATININE 2.03* 2.10* 1.79*  CALCIUM 8.6* 8.6* 8.6*  GFRNONAA 23* 22* 27*  GFRAA 27* 26* 32*    CBC Recent Labs  Lab 12/11/18 0424  WBC 9.3  RBC 3.61*  HGB 9.9*  HCT 31.2*  PLT 229  MCV 86.4  MCH 27.4  MCHC 31.7  RDW 14.6    HEMOGLOBIN A1C Lab Results  Component Value Date   HGBA1C 6.6 (H) 12/10/2018   MPG 142.72 12/10/2018    Cardiac Panel (last 3 results) Recent Labs    12/10/18 0022 12/10/18 0618  12/10/18 1159  TROPONINI 1.03* 0.66* 0.40*    BNP (last 3 results) Recent Labs    12/09/18 1301 12/10/18 0618 12/11/18 0424  BNP 736.6* 744.1* 471.1*   EKG 12/09/2018:  Sinus rhythm. Deep anterolateral T wave inversions, more pronounced compared to previous EKG on 12/09/2018  Outpatient echocardiogram 11/30/2017: Left ventricle is normal in size, moderate LVH, normal LV systolic function, diastolic function could not be assessed due to his/effusion in the setting of first-degree AV block. EF estimated at 55%. Mild MR and mild TR, PA pressure 40 mmHg. Consistent with mild-to-moderate pulmonary hypertension.  Lexiscan Myoview stress test 03/18/2017:  EKG demonstrates LVH with repolarization abnormality. Stress EKG was nondiagnostic. Symptoms included dyspnea. Perfusion imaging study was normal without evidence of ischemia, EF 64%, low risk study.  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 Left femorofemoral bypass graft evident.  Carotid artery duplex 09/12/2018: Less than 50% bilateral carotid artery stenosis. Antegrade vertebral artery flow.  Lower extremity arterial duplex and 11/06/18/20: Elevated velocity in the right external iliac artery with monophasic waveforms suggest hemodynamically significant stenosis proximally, moderately abnormal/monophasic waveform throughout the right lower extremity. Monophasic waveform throughout the left lower extremity may indicate significant left proximal iliac artery stenosis. Moderate velocity increase in the left mid and distal SFA suggests greater than 50% stenosis. Bilateral ABI moderately decreased at 0.69.  Assessment & Recommendations:  78 year old [name] with peripheral artery disease status post femorofemoral bypass, CKD stage IV, atrophic right kidney, now admitted with NSTEMI  NSTEMI: Trop peak 1.4. Conservative medical management recommended in light  of risk of CIN in patient with AKI/CKD and solitary functioning kidney. Complet Continue Aspirin, plavix, isosorbide mononitrate 30 mg, rosuvastatin  Shortness of breath: Likely combination of acute on chronic HFpEF and COPD:  AKI/CKD: Improving  Hypertension: Currently controlled.  Diarrhea: C.Diff workup in progress  Advanced care discussion: Patient is now DNR    Nigel Mormon, M.D. 12/11/2018, 8:11 AM [facility] Pager: [phone] Office: [phone] If no answer: [phone]

## 2018-12-11 NOTE — Progress Notes (Signed)
Physical Therapy Evaluation Patient Details Name: Alyssa [name] MRN: [id number] DOB: [birth year] Today's Date: 12/11/2018   History of Present Illness  Patient is a 78 y/o female presenting to hospital with exertional chest pain and SOB secondary to NSTEMI. Per cardiology notes, managing medically.  PMH includes PVD s/p femoral bypass, HTN, HLD, CKD, anemia, chronic pain syndrome, and R THA.   Clinical Impression  Patient admitted to hospital secondary to problems above and with deficits below. Session limited secondary to patient fatigue. Patient required minA to ambulate short distance to chair with use of RW. Patient is high fall risk and with decreased safety awareness. Feel patient would benefit from short term SNF placement however, patient will likely refuse. If refuses, patient will require HHPT with 24/7 supervision. Family reports patient has necessary help and 24/7 assistance at home.  Patient will benefit from acute physical therapy to maximize independence and safety with functional mobility.     Follow Up Recommendations Other (comment)(SNF vs HHPT)    Equipment Recommendations  None recommended by PT    Recommendations for Other Services       Precautions / Restrictions Precautions Precautions: Fall Precaution Comments: Reports 7 falls in last month Restrictions Weight Bearing Restrictions: No      Mobility  Bed Mobility Overal bed mobility: Needs Assistance Bed Mobility: Supine to Sit     Supine to sit: Supervision     General bed mobility comments: Patient required supervision to sit EOB for safety  Transfers Overall transfer level: Needs assistance Equipment used: Rolling walker (2 wheeled) Transfers: Sit to/from Stand Sit to Stand: Min guard         General transfer comment: Patient required min guard to stand with use of RW. Required 2 attempts to stand. Verbal cues for hand placement prior to standing. Patient ignored cues and pulled up from  RW. Therapist braced RW.   Ambulation/Gait Ambulation/Gait assistance: Min assist Gait Distance (Feet): 2 Feet Assistive device: Rolling walker (2 wheeled) Gait Pattern/deviations: Step-to pattern;Decreased step length - left;Decreased stance time - right;Decreased weight shift to right;Antalgic Gait velocity: decreased Gait velocity interpretation: <1.8 ft/sec, indicate of risk for recurrent falls General Gait Details: Patient required minA for steadying and use of RW during ambulation. Patient reports leg length discrepancy at baseline, and was ambulating on toes on RLE. Verbal cues for sequencing with use of RW. Patient ignored cues. Patient fatiging easily so further mobility deferred.   Stairs            Wheelchair Mobility    Modified Rankin (Stroke Patients Only)       Balance Overall balance assessment: Needs assistance Sitting-balance support: No upper extremity supported;Feet supported Sitting balance-Leahy Scale: Good     Standing balance support: Bilateral upper extremity supported Standing balance-Leahy Scale: Poor Standing balance comment: reliant on BUE support and use of RW for standing balance                             Pertinent Vitals/Pain Pain Assessment: No/denies pain    Home Living Family/patient expects to be discharged to:: Private residence Living Arrangements: Other relatives(brother) Available Help at Discharge: Family;Available 24 hours/day Type of Home: Apartment Home Access: Level entry     Home Layout: One level Home Equipment: Walker - 2 wheels;Walker - 4 wheels;Cane - single point;Bedside commode;Shower seat      Prior Function Level of Independence: Needs assistance   Gait / Transfers Assistance Needed:  Reports using a cane for mobility  ADL's / Homemaking Assistance Needed: Initially states she is independent with ADLs. Later states her brother assists her.   Comments: Reports conflicting information for needs  of assistance with ADLs     Hand Dominance        Extremity/Trunk Assessment   Upper Extremity Assessment Upper Extremity Assessment: Defer to OT evaluation    Lower Extremity Assessment Lower Extremity Assessment: Generalized weakness;RLE deficits/detail RLE Deficits / Details: Patient reports RLE leg length discrepancy.     Cervical / Trunk Assessment Cervical / Trunk Assessment: Normal  Communication   Communication: No difficulties  Cognition Arousal/Alertness: Awake/alert Behavior During Therapy: WFL for tasks assessed/performed Overall Cognitive Status: No family/caregiver present to determine baseline cognitive functioning                                        General Comments General comments (skin integrity, edema, etc.): Patient with UE and LE tremors. Educated about using a RW at home for improved stability    Exercises     Assessment/Plan    PT Assessment Patient needs continued PT services  PT Problem List Decreased strength;Decreased range of motion;Decreased activity tolerance;Decreased balance;Decreased mobility;Decreased knowledge of use of DME;Decreased safety awareness;Decreased knowledge of precautions;Cardiopulmonary status limiting activity       PT Treatment Interventions DME instruction;Gait training;Functional mobility training;Therapeutic activities;Therapeutic exercise;Balance training;Patient/family education    PT Goals (Current goals can be found in the Care Plan section)  Acute Rehab PT Goals Patient Stated Goal: go home PT Goal Formulation: With patient Time For Goal Achievement: 12/25/18 Potential to Achieve Goals: Fair    Frequency Min 3X/week   Barriers to discharge        Co-evaluation               AM-PAC PT "6 Clicks" Mobility  Outcome Measure Help needed turning from your back to your side while in a flat bed without using bedrails?: None Help needed moving from lying on your back to sitting on  the side of a flat bed without using bedrails?: None Help needed moving to and from a bed to a chair (including a wheelchair)?: A Little Help needed standing up from a chair using your arms (e.g., wheelchair or bedside chair)?: A Little Help needed to walk in hospital room?: A Lot Help needed climbing 3-5 steps with a railing? : A Lot 6 Click Score: 18    End of Session Equipment Utilized During Treatment: Gait belt Activity Tolerance: Patient limited by fatigue Patient left: in chair;with call bell/phone within reach;with nursing/sitter in room;with chair alarm set Nurse Communication: Mobility status PT Visit Diagnosis: Unsteadiness on feet (R26.81);Muscle weakness (generalized) (M62.81);Difficulty in walking, not elsewhere classified (R26.2)    Time: 1643-1700 PT Time Calculation (min) (ACUTE ONLY): 17 min   Charges:   PT Evaluation $PT Eval Moderate Complexity: 1 Mod          Erick Blinks, SPT  Erick Blinks 12/11/2018, 5:37 PM

## 2018-12-11 NOTE — Progress Notes (Signed)
Pt having increased productive cough. Upon assessment, pt tachypneic and ronchi bilaterally.patient stated experiencing chest tightness. Paged MD, got orders for breathing treatment and benzonatate capusle.  Will continue to monitor patient closely.

## 2018-12-12 ENCOUNTER — Inpatient Hospital Stay (HOSPITAL_COMMUNITY): Payer: Medicare HMO

## 2018-12-12 DIAGNOSIS — M79604 Pain in right leg: Secondary | ICD-10-CM

## 2018-12-12 DIAGNOSIS — R52 Pain, unspecified: Secondary | ICD-10-CM

## 2018-12-12 DIAGNOSIS — M79605 Pain in left leg: Secondary | ICD-10-CM

## 2018-12-12 DIAGNOSIS — I214 Non-ST elevation (NSTEMI) myocardial infarction: Principal | ICD-10-CM

## 2018-12-12 LAB — CBC
HCT: 27.9 % — ABNORMAL LOW (ref 36.0–46.0)
Hemoglobin: 9 g/dL — ABNORMAL LOW (ref 12.0–15.0)
MCH: 27.4 pg (ref 26.0–34.0)
MCHC: 32.3 g/dL (ref 30.0–36.0)
MCV: 85.1 fL (ref 80.0–100.0)
Platelets: 224 10*3/uL (ref 150–400)
RBC: 3.28 MIL/uL — ABNORMAL LOW (ref 3.87–5.11)
RDW: 14.5 % (ref 11.5–15.5)
WBC: 8.3 10*3/uL (ref 4.0–10.5)
nRBC: 0 % (ref 0.0–0.2)

## 2018-12-12 LAB — COMPREHENSIVE METABOLIC PANEL WITH GFR
ALT: 12 U/L (ref 0–44)
AST: 20 U/L (ref 15–41)
Albumin: 3 g/dL — ABNORMAL LOW (ref 3.5–5.0)
Alkaline Phosphatase: 66 U/L (ref 38–126)
Anion gap: 7 (ref 5–15)
BUN: 31 mg/dL — ABNORMAL HIGH (ref 8–23)
CO2: 17 mmol/L — ABNORMAL LOW (ref 22–32)
Calcium: 9 mg/dL (ref 8.9–10.3)
Chloride: 112 mmol/L — ABNORMAL HIGH (ref 98–111)
Creatinine, Ser: 1.82 mg/dL — ABNORMAL HIGH (ref 0.44–1.00)
GFR calc Af Amer: 31 mL/min — ABNORMAL LOW
GFR calc non Af Amer: 27 mL/min — ABNORMAL LOW
Glucose, Bld: 142 mg/dL — ABNORMAL HIGH (ref 70–99)
Potassium: 4.4 mmol/L (ref 3.5–5.1)
Sodium: 136 mmol/L (ref 135–145)
Total Bilirubin: 0.7 mg/dL (ref 0.3–1.2)
Total Protein: 6 g/dL — ABNORMAL LOW (ref 6.5–8.1)

## 2018-12-12 LAB — GLUCOSE, CAPILLARY
Glucose-Capillary: 104 mg/dL — ABNORMAL HIGH (ref 70–99)
Glucose-Capillary: 85 mg/dL (ref 70–99)
Glucose-Capillary: 102 mg/dL — ABNORMAL HIGH (ref 70–99)

## 2018-12-12 LAB — PHOSPHORUS: Phosphorus: 2.8 mg/dL (ref 2.5–4.6)

## 2018-12-12 LAB — MAGNESIUM: Magnesium: 1.7 mg/dL (ref 1.7–2.4)

## 2018-12-12 LAB — TSH: TSH: 1.003 u[IU]/mL (ref 0.350–4.500)

## 2018-12-12 LAB — LITHIUM LEVEL: Lithium Lvl: 0.57 mmol/L — ABNORMAL LOW (ref 0.60–1.20)

## 2018-12-12 NOTE — Progress Notes (Signed)
Subjective:  No further chest pain, diarrhea has improved, still has shortness of breath that is her baseline.  Has chronic cough.  Intake/Output from previous day:  I/O last 3 completed shifts: In: 360 [P.O.:360] Out: 1800 [Urine:1800] No intake/output data recorded.  Blood pressure 138/87, pulse 65, temperature 99.3 F (37.4 C), temperature source Oral, resp. rate 20, height '5\' 3"'$  (1.6 m), weight 44 kg, SpO2 96 %. Physical Exam  Constitutional: She is oriented to person, place, and time. No distress.  Very frail and chronically ill looking, petite and wasted.    HENT:  Head: Atraumatic.  Eyes: Conjunctivae are normal.  Neck: Neck supple. No JVD present. No thyromegaly present.  Cardiovascular: Normal rate, regular rhythm, normal heart sounds and intact distal pulses. Exam reveals no gallop.  No murmur heard. Carotids 2+, femorals 1-2+ bilaterally, absent popliteals, very faint DP.  Pulmonary/Chest:  Barrel-shaped chest with diffuse rhonchi and crackles bilaterally.  Abdominal: Soft. Bowel sounds are normal.  Musculoskeletal: Normal range of motion.        General: No edema.  Neurological: She is alert and oriented to person, place, and time.  Skin: Skin is warm and dry.  Psychiatric: She has a normal mood and affect.    Lab Results: BMP BNP (last 3 results) Recent Labs    12/09/18 1301 12/10/18 0618 12/11/18 0424  BNP 736.6* 744.1* 471.1*    ProBNP (last 3 results) No results for input(s): PROBNP in the last 8760 hours. BMP Latest Ref Rng & Units 12/12/2018 12/11/2018 12/10/2018  Glucose 70 - 99 mg/dL 142(H) 125(H) 71  BUN 8 - 23 mg/dL 31(H) 33(H) 35(H)  Creatinine 0.44 - 1.00 mg/dL 1.82(H) 1.79(H) 2.10(H)  Sodium 135 - 145 mmol/L 136 137 138  Potassium 3.5 - 5.1 mmol/L 4.4 4.7 5.8(H)  Chloride 98 - 111 mmol/L 112(H) 114(H) 116(H)  CO2 22 - 32 mmol/L 17(L) 17(L) 16(L)  Calcium 8.9 - 10.3 mg/dL 9.0 8.6(L) 8.6(L)   Hepatic Function Latest Ref Rng & Units 12/12/2018  06/23/2017 09/19/2016  Total Protein 6.5 - 8.1 g/dL 6.0(L) - 6.1(L)  Albumin 3.5 - 5.0 g/dL 3.0(L) 3.1(L) 2.5(L)  AST 15 - 41 U/L 20 - 18  ALT 0 - 44 U/L 12 - 12(L)  Alk Phosphatase 38 - 126 U/L 66 - 114  Total Bilirubin 0.3 - 1.2 mg/dL 0.7 - 0.1(L)  Bilirubin, Direct 0.0 - 0.3 mg/dL - - -   CBC Latest Ref Rng & Units 12/12/2018 12/11/2018 12/09/2018  WBC 4.0 - 10.5 K/uL 8.3 9.3 7.1  Hemoglobin 12.0 - 15.0 g/dL 9.0(L) 9.9(L) 12.0  Hematocrit 36.0 - 46.0 % 27.9(L) 31.2(L) 35.8(L)  Platelets 150 - 400 K/uL 224 229 295   Lipid Panel  No results found for: CHOL, TRIG, HDL, CHOLHDL, VLDL, LDLCALC, LDLDIRECT Cardiac Panel (last 3 results) Recent Labs    12/10/18 0022 12/10/18 0618 12/10/18 1159  TROPONINI 1.03* 0.66* 0.40*    HEMOGLOBIN A1C Lab Results  Component Value Date   HGBA1C 6.6 (H) 12/10/2018   MPG 142.72 12/10/2018   TSH Recent Labs    12/12/18 0358  TSH 1.003   Imaging: Dg Abd 1 View  Result Date: 12/10/2018 CLINICAL DATA:  Emesis. EXAM: ABDOMEN - 1 VIEW COMPARISON:  None. FINDINGS: Supine abdomen shows no gaseous bowel dilatation to suggest overt obstruction. Air is seen scattered along segments of the colon. Status post lumbar fusion. Right hip replacement noted. Coarse calcification over the central pelvis likely related to fibroid disease. IMPRESSION: Nonspecific bowel gas  pattern. Electronically Signed   By: Misty Stanley M.D.   On: 12/10/2018 18:04   Dg Chest Port 1 View  Result Date: 12/11/2018 CLINICAL DATA:  Increased shortness of breath EXAM: PORTABLE CHEST 1 VIEW COMPARISON:  12/10/2018 FINDINGS: There is hazy left lower lobe airspace disease concerning for atelectasis versus pneumonia. There is no pleural effusion or pneumothorax. The heart and mediastinal contours are unremarkable. 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 IMPRESSION: Hazy left lower lobe airspace disease concerning for atelectasis versus pneumonia. Electronically Signed   By:  Kathreen Devoid   On: 12/11/2018 11:21   Dg Chest Portable 1 View  Result Date: 12/10/2018 CLINICAL DATA:  Hypoxia EXAM: PORTABLE CHEST 1 VIEW COMPARISON:  Chest radiograph from one day prior. FINDINGS: Stable cardiomediastinal silhouette with normal heart size. No pneumothorax. No pleural effusion. No pulmonary edema. Mild hazy left lung base opacity. IMPRESSION: Mild hazy left lung base opacity, which could represent atelectasis, aspiration or pneumonia. Recommend short-term follow-up PA and lateral chest radiographs. Electronically Signed   By: Ilona Sorrel M.D.   On: 12/10/2018 18:04    EKG 12/09/2018: Normal sinus rhythm at the rate of 108/m, poor R-wave progression, cannot exclude anteroseptal infarct old.  LVH with repolarization abnormality, cannot exclude inferior and lateral ischemia.  Prolonged QT interval.  Compared to 09/26/2017, the T wave inversion in the lateral leads is new.  Outpatient echocardiogram 11/30/2017: Left ventricle is normal in size, moderate LVH, normal LV systolic function, diastolic function could not be assessed due to his/effusion in the setting of first-degree AV block.  EF estimated at 55%.  Mild MR and mild TR, PA pressure 40 mmHg.  Consistent with mild-to-moderate pulmonary hypertension.  Lexiscan Myoview stress test 03/18/2017: EKG demonstrates LVH with repolarization abnormality.  Stress EKG was nondiagnostic.  Symptoms included dyspnea. Perfusion imaging study was normal without evidence of ischemia, EF 64%, low risk study.  Renal arteriogram 06/14/2017: Right renal artery is occluded, patent left renal artery.  Left femorofemoral bypass graft evident.  Carotid artery duplex 09/12/2018: Less than 50% bilateral carotid artery stenosis.  Antegrade vertebral artery flow.  Lower extremity arterial duplex and 11/06/18/20: Elevated velocity in the right external iliac artery with monophasic waveforms suggest hemodynamically significant stenosis proximally,  moderately abnormal/monophasic waveform throughout the right lower extremity. Monophasic waveform throughout the left lower extremity may indicate significant left proximal iliac artery stenosis.  Moderate velocity increase in the left mid and distal SFA suggests greater than 50% stenosis.  Bilateral ABI moderately decreased at 0.69.  Scheduled Meds: . aspirin EC  81 mg Oral Daily  . citalopram  20 mg Oral Daily  . clonazePAM  1 mg Oral BID  . clopidogrel  75 mg Oral Daily  . ferrous sulfate  325 mg Oral Q M,W,F  . gabapentin  300 mg Oral TID  . heparin injection (subcutaneous)  5,000 Units Subcutaneous Q8H  . isosorbide mononitrate  30 mg Oral Daily  . levalbuterol  0.63 mg Nebulization TID  . lithium carbonate  300 mg Oral Daily  . metoprolol tartrate  25 mg Oral BID  . pantoprazole  40 mg Oral Daily  . rosuvastatin  10 mg Oral Daily  . temazepam  30 mg Oral QHS   Continuous Infusions: PRN Meds:.acetaminophen, albuterol, benzonatate, guaiFENesin, nitroGLYCERIN, ondansetron (ZOFRAN) IV, traMADol  ASSESSMENT AND PLAN:  1. Subendocardial myocardial infarction. 2. Shortness of breath, probably related to underlying COPD 3. Stage IV chronic kidney disease, unilateral kidney. 4.  Peripheral arterial disease with  history of either by from bypass surgery, with moderately reduced ABI recently.  2018 abdominal aortogram revealing patent graft. 5.  Resistant hypertension.  Suspect patient probably not taking medications at home as patient became hypotensive while she was in the hospital with receiving home medications.  Presently blood pressure is well controlled.  Recommendation:   Patient is presently doing well and is back to her baseline.  She has not had any further episodes of chest pain.  Medical therapy only in view of multiple medical comorbidity.  She is not a good candidate for coronary intervention or diagnostic angiography.  Long-term prognosis remains very poor, she is frail,  wasted and with severe stage IV chronic kidney disease as well as severe peripheral arterial disease.  Continue aspirin along with Plavix for non-STEMI.  I will see her back in 2 to 3 weeks for follow-up.  She is now DNR, I have discussed her CODE STATUS and also long-term poor prognosis with her son Mr. Shina Wass.  Adrian Prows, M.D. 12/12/2018, 1:33 PM [facility], [place] Pager: [phone] Office: [phone] If no answer: [phone]

## 2018-12-12 NOTE — Clinical Social Work Note (Signed)
Clinical Social Work Assessment  Patient Details  Name: Alyssa [name] MRN: [id number] Date of Birth: [birth year]  Date of referral:  12/12/18               Reason for consult:  Discharge Planning                Permission sought to share information with:  Facility Sport and exercise psychologist, Family Supports Permission granted to share information::  Yes, Verbal Permission Granted  Name::     Alyssa [name]  Agency::  [facility] SNFs  Relationship::  Son  Contact Information:  [phone]  Housing/Transportation Living arrangements for the past 2 months:    Source of Information:  Patient Patient Interpreter Needed:  None Criminal Activity/Legal Involvement Pertinent to Current Situation/Hospitalization:  No - Comment as needed Significant Relationships:  Adult Children Lives with:    Do you feel safe going back to the place where you live?  No Need for family participation in patient care:  Yes (Comment)  Care giving concerns:  Patient with decreased mobility, unable to care for self, requiring 24/7 assistance.    Social Worker assessment / plan:  SW Intern met with patient at bedside, introduced self and role. Patient was sleeping, and woke up when intern arrived, but was still lethargic. SW Intern discussed physical therapy recommendations with patient, explaining that she required 24/7 assistance, which could be provided at [facility]. Given previous reports that patient wanted to go home, SW Intern explained that 24/7 care could be provided with home health and family support. Patient stated that she prefers to go to a rehab facility, and asked SW intern to call Alyssa [name], son, to discuss discharge plan.   SW Intern called son, introduced self and discussed discharge plan. Son stated that he is comfortable with plan to go to SNF if his mother is willing. SW Intern to send referrals to [facility], and follow up with patient and son regarding bed offers. Patient requires  [facility] pre-authorization, taking approximately 1 day.    SW Intern/CSW will continue to support with discharge.  Employment status:  Retired Forensic scientist:  Other (Comment Required)([employer]) PT Recommendations:  [address], [place], Home with [place] / Referral to community resources:  [place]  Patient/Family's Response to care:  Patient and son appreciative of support from social work Theatre manager.   Patient/Family's Understanding of and Emotional Response to Diagnosis, Current Treatment, and Prognosis:  Patient and son with good understanding of care needs.   Emotional Assessment Appearance:  Appears stated age Attitude/Demeanor/Rapport:  Lethargic Affect (typically observed):  Flat, Appropriate Orientation:  Oriented to Self, Oriented to Place, Oriented to  Time, Oriented to Situation Alcohol / Substance use:  Not Applicable Psych involvement (Current and /or in the community):  No (Comment)  Discharge Needs  Concerns to be addressed:  Discharge Planning Concerns Readmission within the last 30 days:  No Current discharge risk:  Dependent with Mobility Barriers to Discharge:  Continued Medical Work up, [place], [place] Work 12/12/2018, 11:51 AM

## 2018-12-12 NOTE — NC FL2 (Signed)
[place] LEVEL OF CARE SCREENING TOOL     IDENTIFICATION  [name] Birthdate: 09-13-1941 Sex: female Admission Date (Current Location): 12/09/2018  [facility] and [place] Number:  Herbalist and Address:  The . [facility], [place] [address] [place], [place] [address]      Provider Number: [id number]  Attending Physician Name and Address:  Mariel Aloe, MD  Relative Name and Phone Number:  Smitty Pluck    Current Level of Care: SNF Recommended Level of Care: [place] Prior Approval Number:    Date Approved/Denied:   PASRR Number: [id number]  Discharge Plan: SNF    Current Diagnoses: Patient Active Problem List   Diagnosis Date Noted  . Non-ST elevation (NSTEMI) myocardial infarction ([place])   . Chest pain   . Dyspnea on exertion   . Elevated troponin level 12/09/2018  . Chronic migraine 03/27/2018  . Lumbar radiculopathy 03/27/2018  . Gait abnormality 03/27/2018  . CKD (chronic kidney disease) stage 4, GFR 15-29 ml/min (HCC) 12/28/2016  . Normocytic anemia 12/28/2016  . Chronic pain syndrome 12/28/2016  . Benzodiazepine withdrawal ([place]) 12/28/2016  . Benzodiazepine withdrawal without complication ([place]) 35/57/3220  . Chronic right shoulder pain 12/06/2016  . Fall 09/17/2016  . Fracture of humeral head, closed, right, initial encounter 09/17/2016  . Rib fractures 09/17/2016  . Multiple falls 09/17/2016  . Severe muscle deconditioning 09/17/2016  . Failure to thrive in adult 09/17/2016  . Melena 04/25/2016  . Essential hypertension 04/25/2016  . Anxiety state 04/25/2016  . Tobacco abuse 04/25/2016  . Hyperlipidemia 04/25/2016  . Syncope 04/24/2016  . Syncope and collapse 04/24/2016  . Gait difficulty 01/12/2016  . Foot drop, left 01/12/2016  . Severe recurrent major depression without psychotic features ([place]) 08/28/2015  . Spondylolisthesis at L4-L5 level 07/30/2015  . PVD  (peripheral vascular disease) with claudication ([place]) 07/29/2014  . Weakness-Bilateral arm/leg 07/29/2014  . Numbness-left leg 07/29/2014  . Swelling of limb-Legs 07/29/2014  . Atherosclerosis of native arteries of the extremities with intermittent claudication 09/30/2011    Orientation RESPIRATION BLADDER Height & Weight     Self, Time, Situation, Place  Normal Continent Weight: 44 kg Height:  5\' 3"  (160 cm)  BEHAVIORAL SYMPTOMS/MOOD NEUROLOGICAL BOWEL NUTRITION STATUS      Incontinent Diet  AMBULATORY STATUS COMMUNICATION OF NEEDS Skin   Limited Assist Verbally Normal                       Personal Care Assistance Level of Assistance  Bathing, Feeding, Dressing Bathing Assistance: Maximum assistance Feeding assistance: Independent Dressing Assistance: Maximum assistance     Functional Limitations Info  Sight, Hearing, Speech Sight Info: Adequate Hearing Info: Adequate Speech Info: Adequate    SPECIAL CARE FACTORS FREQUENCY  PT (By licensed PT), OT (By licensed OT)     PT Frequency: 5/weekly OT Frequency: 5x/weekly            Contractures Contractures Info: Not present    Additional Factors Info  Code Status, Allergies Code Status Info: DNR Allergies Info: No Known Allergies           Current Medications (12/12/2018):  This is the current hospital active medication list Current Facility-Administered Medications  Medication Dose Route Frequency Provider Last Rate Last Dose  . acetaminophen (TYLENOL) tablet 650 mg  650 mg Oral Q4H PRN Truett Mainland, DO      . albuterol (PROVENTIL) (2.5 MG/3ML) 0.083% nebulizer  solution 2.5 mg  2.5 mg Nebulization Q2H PRN Cherene Altes, MD      . aspirin EC tablet 81 mg  81 mg Oral Daily Truett Mainland, DO   81 mg at 12/12/18 1019  . benzonatate (TESSALON) capsule 200 mg  200 mg Oral BID PRN Blount, Scarlette Shorts T, NP      . citalopram (CELEXA) tablet 20 mg  20 mg Oral Daily Swayze, Ava, DO   20 mg at 12/12/18 1019  .  clonazePAM (KLONOPIN) tablet 1 mg  1 mg Oral BID Truett Mainland, DO   1 mg at 12/12/18 1033  . clopidogrel (PLAVIX) tablet 75 mg  75 mg Oral Daily Adrian Prows, MD   75 mg at 12/12/18 1019  . ferrous sulfate tablet 325 mg  325 mg Oral Q M,W,F Truett Mainland, DO   325 mg at 12/11/18 0846  . gabapentin (NEURONTIN) capsule 300 mg  300 mg Oral TID Truett Mainland, DO   300 mg at 12/12/18 1027  . guaiFENesin (ROBITUSSIN) 100 MG/5ML solution 100 mg  5 mL Oral Q4H PRN Swayze, Ava, DO   100 mg at 12/11/18 0352  . heparin injection 5,000 Units  5,000 Units Subcutaneous Q8H Cherene Altes, MD   5,000 Units at 12/11/18 2226  . isosorbide mononitrate (IMDUR) 24 hr tablet 30 mg  30 mg Oral Daily Patwardhan, Manish J, MD   30 mg at 12/12/18 1019  . levalbuterol (XOPENEX) nebulizer solution 0.63 mg  0.63 mg Nebulization TID Swayze, Ava, DO   0.63 mg at 12/12/18 0904  . lithium carbonate capsule 300 mg  300 mg Oral Daily Truett Mainland, DO   300 mg at 12/12/18 1019  . metoprolol tartrate (LOPRESSOR) tablet 25 mg  25 mg Oral BID Swayze, Ava, DO   25 mg at 12/12/18 1020  . nitroGLYCERIN (NITROSTAT) SL tablet 0.4 mg  0.4 mg Sublingual Q5 Min x 3 PRN Truett Mainland, DO   0.4 mg at 12/11/18 0253  . ondansetron (ZOFRAN) injection 4 mg  4 mg Intravenous Q6H PRN Truett Mainland, DO      . pantoprazole (PROTONIX) EC tablet 40 mg  40 mg Oral Daily Truett Mainland, DO   40 mg at 12/12/18 1019  . rosuvastatin (CRESTOR) tablet 10 mg  10 mg Oral Daily Truett Mainland, DO   10 mg at 12/12/18 1019  . temazepam (RESTORIL) capsule 30 mg  30 mg Oral QHS Truett Mainland, DO   30 mg at 12/09/18 2208  . traMADol (ULTRAM) tablet 50 mg  50 mg Oral Q6H PRN Truett Mainland, DO   50 mg at 12/12/18 1019     Discharge Medications: Please see discharge summary for a list of discharge medications.  Relevant Imaging Results:  Relevant Lab Results:   Additional Information SSN: [id number]  Estanislado Emms, LCSW

## 2018-12-12 NOTE — Evaluation (Signed)
Occupational Therapy Evaluation Patient Details Name: Alyssa [name] MRN: [id number] DOB: March 23, 1941 Today's Date: 12/12/2018    History of Present Illness Patient is a 77 y/o female presenting to hospital with exertional chest pain and SOB secondary to NSTEMI. Per cardiology notes, managing medically.  PMH includes PVD s/p femoral bypass, HTN, HLD, CKD, anemia, chronic pain syndrome, and R THA.    Clinical Impression   Pt with decline in function and safety with ADLs and ADL mobility with decreased strength, balance and endurance. Pt reports that PTA, she lived at home wit her brother and was independent with ADLs/selfcare and home mgt, however later pt stated that he brother assists her with ADLs. Pt would benefit from acute OT services to address impairments to maximize level of function and safety    Follow Up Recommendations  SNF    Equipment Recommendations  Other (comment)(TBD at next venu of care)    Recommendations for Other Services       Precautions / Restrictions Precautions Precautions: Fall Precaution Comments: Reports 7 falls in last month Restrictions Weight Bearing Restrictions: No      Mobility Bed Mobility Overal bed mobility: Needs Assistance Bed Mobility: Supine to Sit     Supine to sit: Supervision        Transfers Overall transfer level: Needs assistance Equipment used: Rolling walker (2 wheeled) Transfers: Sit to/from Stand Sit to Stand: Min guard         General transfer comment: cues for hand placement     Balance Overall balance assessment: Needs assistance Sitting-balance support: No upper extremity supported;Feet supported Sitting balance-Leahy Scale: Good     Standing balance support: Single extremity supported;During functional activity                               ADL either performed or assessed with clinical judgement   ADL Overall ADL's : Needs assistance/impaired     Grooming: Wash/dry  hands;Wash/dry face;Min guard;Sitting   Upper Body Bathing: Min guard;Sitting   Lower Body Bathing: Moderate assistance;Sitting/lateral leans;Sit to/from stand   Upper Body Dressing : Min guard;Sitting   Lower Body Dressing: Moderate assistance;Sitting/lateral leans;Sit to/from stand   Toilet Transfer: Min guard;Ambulation;RW;BSC   Toileting- Clothing Manipulation and Hygiene: Minimal assistance;Sit to/from stand       Functional mobility during ADLs: Min guard       Vision Patient Visual Report: No change from baseline       Perception     Praxis      Pertinent Vitals/Pain Pain Assessment: No/denies pain     Hand Dominance Right   Extremity/Trunk Assessment Upper Extremity Assessment Upper Extremity Assessment: Generalized weakness   Lower Extremity Assessment Lower Extremity Assessment: Defer to PT evaluation   Cervical / Trunk Assessment Cervical / Trunk Assessment: Normal   Communication Communication Communication: No difficulties   Cognition Arousal/Alertness: Awake/alert Behavior During Therapy: WFL for tasks assessed/performed Overall Cognitive Status: No family/caregiver present to determine baseline cognitive functioning                                     General Comments       Exercises     Shoulder Instructions      Home Living Family/patient expects to be discharged to:: Private residence Living Arrangements: Other relatives(brother) Available Help at Discharge: Family;Available 24 hours/day Type of Home: Apartment  Home Access: Level entry     Home Layout: One level     Bathroom Shower/Tub: Teacher, early years/pre: Standard     Home Equipment: Environmental consultant - 2 wheels;Walker - 4 wheels;Cane - single point;Bedside commode;Shower seat;Tub bench          Prior Functioning/Environment Level of Independence: Needs assistance  Gait / Transfers Assistance Needed: Reports using a cane for mobility ADL's /  Homemaking Assistance Needed: Initially states she is independent with ADLs. Later states her brother assists her.    Comments: Reports conflicting information for needs of assistance with ADLs        OT Problem List: Decreased activity tolerance;Decreased strength;Decreased cognition;Decreased knowledge of use of DME or AE;Impaired balance (sitting and/or standing);Decreased safety awareness;Decreased knowledge of precautions      OT Treatment/Interventions: Self-care/ADL training;DME and/or AE instruction;Therapeutic activities;Patient/family education    OT Goals(Current goals can be found in the care plan section) Acute Rehab OT Goals Patient Stated Goal: go home OT Goal Formulation: With patient Time For Goal Achievement: 12/26/18 Potential to Achieve Goals: Good ADL Goals Pt Will Perform Grooming: with supervision;with set-up;sitting;standing Pt Will Perform Upper Body Bathing: with supervision;with set-up;sitting Pt Will Perform Lower Body Bathing: with min assist;sit to/from stand Pt Will Perform Upper Body Dressing: with supervision;with set-up;sitting Pt Will Perform Lower Body Dressing: with min assist;sit to/from stand Pt Will Transfer to Toilet: with supervision;ambulating Pt Will Perform Toileting - Clothing Manipulation and hygiene: with min guard assist;with supervision;sit to/from stand Pt Will Perform Tub/Shower Transfer: with min guard assist;with supervision;ambulating;tub bench  OT Frequency: Min 2X/week   Barriers to D/C: Decreased caregiver support          Co-evaluation              AM-PAC OT "6 Clicks" Daily Activity     Outcome Measure Help from another person eating meals?: None Help from another person taking care of personal grooming?: A Little Help from another person toileting, which includes using toliet, bedpan, or urinal?: A Lot Help from another person bathing (including washing, rinsing, drying)?: A Lot Help from another person to put  on and taking off regular upper body clothing?: A Little Help from another person to put on and taking off regular lower body clothing?: A Lot 6 Click Score: 16   End of Session Equipment Utilized During Treatment: Gait belt;Rolling walker;Other (comment)(BSC)  Activity Tolerance: Patient tolerated treatment well Patient left: in chair;with call bell/phone within reach;with chair alarm set  OT Visit Diagnosis: Unsteadiness on feet (R26.81);Other abnormalities of gait and mobility (R26.89);Muscle weakness (generalized) (M62.81);History of falling (Z91.81);Other symptoms and signs involving cognitive function                Time: 2130-8657 OT Time Calculation (min): 26 min Charges:  OT General Charges $OT Visit: 1 Visit OT Evaluation $OT Eval Moderate Complexity: 1 Mod OT Treatments $Self Care/Home Management : 8-22 mins    Britt Bottom 12/12/2018, 12:41 PM

## 2018-12-12 NOTE — Progress Notes (Signed)
Bilateral lower extremity venous duplex completed. Preliminary results in Chart review CV Proc. [place] Chrissi Crow,RVS 12/12/2018 2:25 PM

## 2018-12-12 NOTE — Social Work (Signed)
Started patient's SNF authorization request with [facility]. Awaiting approval. Will provide CMS SNF list and bed offers when available. CSW to follow.  Estanislado Emms, LCSW [phone]

## 2018-12-12 NOTE — Progress Notes (Signed)
PROGRESS NOTE    Alyssa [name]  JKK:[id number] DOB: September 21, 1941 DOA: 12/09/2018 PCP: Everardo Beals, NP   Brief Narrative: Alyssa [name] is a 78 y.o. female with a history of peripheral vascular disease, chronic pain syndrome, essential hypertension, hyperlipidemia, and stage IV chronic kidney disease.   Assessment & Plan:   Principal Problem:   Elevated troponin level Active Problems:   PVD (peripheral vascular disease) with claudication (HCC)   Essential hypertension   Hyperlipidemia   CKD (chronic kidney disease) stage 4, GFR 15-29 ml/min (HCC)   Chronic pain syndrome   Non-ST elevation (NSTEMI) myocardial infarction [facility])   Chest pain   Dyspnea on exertion   NSTEMI Peak troponin of 1.45. Downtrended. EKG with deep anterolateral t-wave inversions which are more pronounced than previous, otherwise sinus rhythm. Cardiology consulted and recommended conservative management. -Cardiology recommendations: Aspirin, Plavix, Imdur, Crestor  LLL airspace disease Seen on chest x-ray. Antibiotics held. No hypoxia.  Shortness of breath Patient with wheezing on exam. Prior smoker. -Albuterol prn  Hypotension Resolved.  Hypoglycemia Resolved  Diabetes mellitus New diagnosis with hemoglobin A1C of 6.6%. Taking patient's age into consideration, would recommend diet control. Needs PCP follow-up  Diarrhea C. Diff was negative. Viral etiology was suspected.  CKD stage 4 Creatinine baseline.  Hyperlipidemia -Continue Crestor  PAD Patient is s/p femorofemoral bypass  Chronic pain  Normocytic anemia Secondary to kidney disease  Calf pain/cramping -Venous duplex   DVT prophylaxis: heparin subq Code Status:   Code Status: DNR Family Communication: None at bedside Disposition Plan: Discharge to SNF when bed available   Consultants:   Cardiology  Procedures:   Transthoracic Echocardiogram (12/11/2018) IMPRESSIONS  1. The left ventricle has  low normal systolic function, with an ejection fraction of 50-55%. The cavity size was normal. There is moderate concentric left ventricular hypertrophy. Left ventricular diastolic Doppler parameters are consistent with  pseudonormalization Elevated left ventricular end-diastolic pressure.  2. The right ventricle has normal systolic function. The cavity was normal. There is no increase in right ventricular wall thickness.  3. Left atrial size was mildly dilated.  4. The mitral valve is normal in structure.  5. The tricuspid valve is normal in structure.  6. The aortic valve is normal in structure.  7. The aortic root is normal in size and structure.  8. Mild hypokinesis of the left ventricular apical segment.  Antimicrobials:  None    Subjective: Some leg pain overnight. No swelling.  Objective: Vitals:   12/11/18 2225 12/12/18 0317 12/12/18 0905 12/12/18 1019  BP:  127/80  (!) 163/94  Pulse:  79  81  Resp:   18   Temp: 99.1 F (37.3 C) 98.2 F (36.8 C)    TempSrc: Oral Oral    SpO2:  94%    Weight:  44 kg    Height:        Intake/Output Summary (Last 24 hours) at 12/12/2018 1135 Last data filed at 12/12/2018 0317 Gross per 24 hour  Intake 120 ml  Output 1200 ml  Net -1080 ml   Filed Weights   12/09/18 1645 12/10/18 0505 12/12/18 0317  Weight: 40.2 kg 40.2 kg 44 kg    Examination:  General exam: Appears calm and comfortable Respiratory system: Wheezing bilaterally. Respiratory effort normal. Cardiovascular system: S1 & S2 heard, RRR. No murmurs, rubs, gallops or clicks. Gastrointestinal system: Abdomen is nondistended, soft and nontender. No organomegaly or masses felt. Normal bowel sounds heard. Central nervous system: Alert and oriented. No focal neurological  deficits. Extremities: No edema. No calf tenderness/swelling/erythema Skin: No cyanosis. No rashes Psychiatry: Judgement and insight appear normal. Mood & affect appropriate.     Data Reviewed: I have  personally reviewed following labs and imaging studies  CBC: Recent Labs  Lab 12/09/18 1301 12/11/18 0424 12/12/18 0358  WBC 7.1 9.3 8.3  HGB 12.0 9.9* 9.0*  HCT 35.8* 31.2* 27.9*  MCV 84.6 86.4 85.1  PLT 295 229 361   Basic Metabolic Panel: Recent Labs  Lab 12/09/18 1301 12/10/18 0618 12/10/18 1719 12/11/18 0424 12/12/18 0358  NA 138 139 138 137 136  K 4.2 4.9 5.8* 4.7 4.4  CL 110 112* 116* 114* 112*  CO2 15* 17* 16* 17* 17*  GLUCOSE 162* 104* 71 125* 142*  BUN 27* 32* 35* 33* 31*  CREATININE 1.97* 2.03* 2.10* 1.79* 1.82*  CALCIUM 10.0 8.6* 8.6* 8.6* 9.0  MG  --   --   --   --  1.7  PHOS  --   --   --   --  2.8   GFR: Estimated Creatinine Clearance: 18.6 mL/min (A) (by C-G formula based on SCr of 1.82 mg/dL (H)). Liver Function Tests: Recent Labs  Lab 12/12/18 0358  AST 20  ALT 12  ALKPHOS 66  BILITOT 0.7  PROT 6.0*  ALBUMIN 3.0*   No results for input(s): LIPASE, AMYLASE in the last 168 hours. No results for input(s): AMMONIA in the last 168 hours. Coagulation Profile: No results for input(s): INR, PROTIME in the last 168 hours. Cardiac Enzymes: Recent Labs  Lab 12/09/18 1723 12/10/18 0022 12/10/18 0618 12/10/18 1159  TROPONINI 1.45* 1.03* 0.66* 0.40*   BNP (last 3 results) No results for input(s): PROBNP in the last 8760 hours. HbA1C: Recent Labs    12/10/18 0618  HGBA1C 6.6*   CBG: Recent Labs  Lab 12/11/18 0818 12/11/18 1220 12/11/18 1723 12/11/18 2115 12/12/18 0807  GLUCAP 132* 144* 137* 143* 104*   Lipid Profile: No results for input(s): CHOL, HDL, LDLCALC, TRIG, CHOLHDL, LDLDIRECT in the last 72 hours. Thyroid Function Tests: Recent Labs    12/12/18 0358  TSH 1.003   Anemia Panel: No results for input(s): VITAMINB12, FOLATE, FERRITIN, TIBC, IRON, RETICCTPCT in the last 72 hours. Sepsis Labs: No results for input(s): PROCALCITON, LATICACIDVEN in the last 168 hours.  Recent Results (from the past 240 hour(s))  C  difficile quick scan w PCR reflex     Status: None   Collection Time: 12/11/18 12:33 AM  Result Value Ref Range Status   C Diff antigen NEGATIVE NEGATIVE Final   C Diff toxin NEGATIVE NEGATIVE Final   C Diff interpretation No C. difficile detected.  Final         Radiology Studies: Dg Abd 1 View  Result Date: 12/10/2018 CLINICAL DATA:  Emesis. EXAM: ABDOMEN - 1 VIEW COMPARISON:  None. FINDINGS: Supine abdomen shows no gaseous bowel dilatation to suggest overt obstruction. Air is seen scattered along segments of the colon. Status post lumbar fusion. Right hip replacement noted. Coarse calcification over the central pelvis likely related to fibroid disease. IMPRESSION: Nonspecific bowel gas pattern. Electronically Signed   By: Misty Stanley M.D.   On: 12/10/2018 18:04   Dg Chest Port 1 View  Result Date: 12/11/2018 CLINICAL DATA:  Increased shortness of breath EXAM: PORTABLE CHEST 1 VIEW COMPARISON:  12/10/2018 FINDINGS: There is hazy left lower lobe airspace disease concerning for atelectasis versus pneumonia. There is no pleural effusion or pneumothorax. The heart and mediastinal  contours are unremarkable. 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 IMPRESSION: Hazy left lower lobe airspace disease concerning for atelectasis versus pneumonia. Electronically Signed   By: Kathreen Devoid   On: 12/11/2018 11:21   Dg Chest Portable 1 View  Result Date: 12/10/2018 CLINICAL DATA:  Hypoxia EXAM: PORTABLE CHEST 1 VIEW COMPARISON:  Chest radiograph from one day prior. FINDINGS: Stable cardiomediastinal silhouette with normal heart size. No pneumothorax. No pleural effusion. No pulmonary edema. Mild hazy left lung base opacity. IMPRESSION: Mild hazy left lung base opacity, which could represent atelectasis, aspiration or pneumonia. Recommend short-term follow-up PA and lateral chest radiographs. Electronically Signed   By: Ilona Sorrel M.D.   On: 12/10/2018 18:04        Scheduled  Meds: . aspirin EC  81 mg Oral Daily  . citalopram  20 mg Oral Daily  . clonazePAM  1 mg Oral BID  . clopidogrel  75 mg Oral Daily  . ferrous sulfate  325 mg Oral Q M,W,F  . gabapentin  300 mg Oral TID  . heparin injection (subcutaneous)  5,000 Units Subcutaneous Q8H  . isosorbide mononitrate  30 mg Oral Daily  . levalbuterol  0.63 mg Nebulization TID  . lithium carbonate  300 mg Oral Daily  . metoprolol tartrate  25 mg Oral BID  . pantoprazole  40 mg Oral Daily  . rosuvastatin  10 mg Oral Daily  . temazepam  30 mg Oral QHS   Continuous Infusions:   LOS: 3 days     Cordelia Poche, MD [facility] 12/12/2018, 11:35 AM  If 7PM-7AM, please contact night-coverage [email or web address]

## 2018-12-12 NOTE — Progress Notes (Signed)
PT Cancellation Note  Patient Details Name: Alyssa [name] MRN: [id number] DOB: [birth year]   Cancelled Treatment:    Reason Eval/Treat Not Completed: Patient at procedure or test/unavailable (to vascular lab). Will follow-up for PT treatment as schedule permits.  Mabeline Caras, PT, DPT Acute Rehabilitation Services  Pager [phone] Office [place] 12/12/2018, 1:59 PM

## 2018-12-13 ENCOUNTER — Other Ambulatory Visit: Payer: Self-pay

## 2018-12-13 ENCOUNTER — Encounter (HOSPITAL_COMMUNITY): Payer: Self-pay | Admitting: General Practice

## 2018-12-13 DIAGNOSIS — R778 Other specified abnormalities of plasma proteins: Secondary | ICD-10-CM

## 2018-12-13 HISTORY — DX: Other specified abnormalities of plasma proteins: R77.8

## 2018-12-13 LAB — GLUCOSE, CAPILLARY
Glucose-Capillary: 95 mg/dL (ref 70–99)
Glucose-Capillary: 85 mg/dL (ref 70–99)

## 2018-12-13 MED ORDER — ISOSORBIDE MONONITRATE ER 60 MG PO TB24: 60.0000 mg | ORAL_TABLET | Freq: Every day | ORAL | Status: DC

## 2018-12-13 MED ORDER — ASPIRIN 81 MG PO TBEC: 81.0000 mg | DELAYED_RELEASE_TABLET | Freq: Every day | ORAL | Status: DC

## 2018-12-13 MED ORDER — TEMAZEPAM 30 MG PO CAPS: 30.0000 mg | ORAL_CAPSULE | Freq: Every day | ORAL | 0 refills | Status: DC

## 2018-12-13 MED ORDER — FERROUS SULFATE 325 (65 FE) MG PO TABS: 325.0000 mg | ORAL_TABLET | ORAL | 0 refills | Status: DC

## 2018-12-13 MED ORDER — ISOSORBIDE MONONITRATE ER 60 MG PO TB24: 60.0000 mg | ORAL_TABLET | Freq: Every day | ORAL | 3 refills | Status: DC

## 2018-12-13 MED ORDER — CITALOPRAM HYDROBROMIDE 40 MG PO TABS: 40.0000 mg | ORAL_TABLET | Freq: Every day | ORAL | 0 refills | Status: DC

## 2018-12-13 MED ORDER — ROSUVASTATIN CALCIUM 40 MG PO TABS: 40.0000 mg | ORAL_TABLET | Freq: Every day | ORAL | 0 refills | Status: DC

## 2018-12-13 MED ORDER — CLONAZEPAM 1 MG PO TABS: 1.0000 mg | ORAL_TABLET | Freq: Two times a day (BID) | ORAL | 0 refills | Status: DC

## 2018-12-13 MED ORDER — CLOPIDOGREL BISULFATE 75 MG PO TABS: 75.0000 mg | ORAL_TABLET | Freq: Every day | ORAL | Status: DC

## 2018-12-13 MED ORDER — METOPROLOL TARTRATE 25 MG PO TABS: 25.0000 mg | ORAL_TABLET | Freq: Two times a day (BID) | ORAL | Status: DC

## 2018-12-13 MED ORDER — GABAPENTIN 300 MG PO CAPS: 300.0000 mg | ORAL_CAPSULE | Freq: Three times a day (TID) | ORAL | 0 refills | Status: DC

## 2018-12-13 MED ORDER — LITHIUM CARBONATE 300 MG PO TABS: 300.0000 mg | ORAL_TABLET | Freq: Every day | ORAL | 0 refills | Status: DC

## 2018-12-13 NOTE — Progress Notes (Signed)
Subjective:  Chest pain, shortness of breath resolved.  Objective:  Vital Signs in the last 24 hours: Temp:  [97.9 F (36.6 C)-99.3 F (37.4 C)] 98.2 F (36.8 C) (02/26 0517) Pulse Rate:  [63-75] 75 (02/26 0926) Resp:  [18-20] 20 (02/25 2128) BP: (132-151)/(87-97) 151/94 (02/26 0926) SpO2:  [93 %-97 %] 93 % (02/26 0906) Weight:  [44 kg-44.6 kg] 44.6 kg (02/26 0517)  Intake/Output from previous day: 02/25 0701 - 02/26 0700 In: 290 [P.O.:290] Out: 600 [Urine:600]  Physical Exam Constitutional: She is oriented to person, place, and time. No distress.  Very frail and chronically ill looking, petite  HENT:  Head: Atraumatic.  Eyes: Conjunctivae are normal.  Neck: Neck supple. No JVD present. No thyromegaly present.  Cardiovascular: Normal rate, regular rhythm, normal heart sounds and intact distal pulses. Exam reveals no gallop.  No murmur heard. Carotids 2+, femorals 1-2+ bilaterally, absent popliteals, very faint DP.  Pulmonary/Chest:  Barrel-shaped chest with diffuse rhonchi and crackles bilaterally.  Abdominal: Soft. Bowel sounds are normal.  Musculoskeletal: Normal range of motion.        General: No edema.  Neurological: She is alert and oriented to person, place, and time.  Skin: Skin is warm and dry.  Psychiatric: She has a normal mood and affect.   Lab Results: BMP Recent Labs    12/10/18 1719 12/11/18 0424 12/12/18 0358  NA 138 137 136  K 5.8* 4.7 4.4  CL 116* 114* 112*  CO2 16* 17* 17*  GLUCOSE 71 125* 142*  BUN 35* 33* 31*  CREATININE 2.10* 1.79* 1.82*  CALCIUM 8.6* 8.6* 9.0  GFRNONAA 22* 27* 27*  GFRAA 26* 32* 31*    CBC Recent Labs  Lab 12/12/18 0358  WBC 8.3  RBC 3.28*  HGB 9.0*  HCT 27.9*  PLT 224  MCV 85.1  MCH 27.4  MCHC 32.3  RDW 14.5    HEMOGLOBIN A1C Lab Results  Component Value Date   HGBA1C 6.6 (H) 12/10/2018   MPG 142.72 12/10/2018    Cardiac Panel (last 3 results) Recent Labs    12/10/18 0022 12/10/18 0618  12/10/18 1159  TROPONINI 1.03* 0.66* 0.40*    BNP (last 3 results) Recent Labs    12/09/18 1301 12/10/18 0618 12/11/18 0424  BNP 736.6* 744.1* 471.1*   EKG 12/09/2018:  Sinus rhythm. Deep anterolateral T wave inversions, more pronounced compared to previous EKG on 12/09/2018  Outpatient echocardiogram 11/30/2017: Left ventricle is normal in size, moderate LVH, normal LV systolic function, diastolic function could not be assessed due to his/effusion in the setting of first-degree AV block. EF estimated at 55%. Mild MR and mild TR, PA pressure 40 mmHg. Consistent with mild-to-moderate pulmonary hypertension.  Lexiscan Myoview stress test 03/18/2017:  EKG demonstrates LVH with repolarization abnormality. Stress EKG was nondiagnostic. Symptoms included dyspnea. Perfusion imaging study was normal without evidence of ischemia, EF 64%, low risk study.  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 Left femorofemoral bypass graft evident.  Carotid artery duplex 09/12/2018: Less than 50% bilateral carotid artery stenosis. Antegrade vertebral artery flow.  Lower extremity arterial duplex and 11/06/18/20: Elevated velocity in the right external iliac artery with monophasic waveforms suggest hemodynamically significant stenosis proximally, moderately abnormal/monophasic waveform throughout the right lower extremity. Monophasic waveform throughout the left lower extremity may indicate significant left proximal iliac artery stenosis. Moderate velocity increase in the left mid and distal SFA suggests greater than 50% stenosis. Bilateral ABI moderately decreased at 0.69.  Assessment & Recommendations:  78 year old [name] with peripheral artery disease status post femorofemoral bypass, CKD stage IV, atrophic right kidney, now admitted with NSTEMI  NSTEMI: Trop peak 1.4. Conservative medical management recommended in light  of risk of CIN in patient with AKI/CKD and solitary functioning kidney. Complet Continue Aspirin, plavix, isosorbide mononitrate-increase to 60 mg, rosuvastatin  Shortness of breath: Likely combination of acute on chronic HFpEF and COPD:  AKI/CKD: Improving  Hypertension: Currently controlled.  Diarrhea: C.Diff workup in progress  Advanced care discussion: Patient is now DNR  No acute cardiac issues pending address at this time. We will sign off at this time, but please feel free to contact us again, as needed.  I have arranged outpatient follow up.   Nigel Mormon, M.D. 12/13/2018, 10:38 AM [facility] Pager: [phone] Office: [phone] If no answer: [phone]

## 2018-12-13 NOTE — Progress Notes (Addendum)
Called [facility] for report. Sent paperwork with son. He received paper prescriptions. She is discharging via car with her son. [facility]  Saddie Benders RN  Left number at desk for return call for report.

## 2018-12-13 NOTE — Social Work (Signed)
Patient will discharge to [facility] Anticipated discharge date: 12/13/2018 Family notified: Rayaan Lorah, son Transportation by: son will drive patient to the SNF when he gets off work at 3:30.  Nurse to call report to [phone].  CSW signing off.  Estanislado Emms, [place]  Clinical Social Worker

## 2018-12-13 NOTE — Progress Notes (Signed)
Physical Therapy Treatment Patient Details Name: Alyssa [name] MRN: [id number] DOB: Sep 11, 1941 Today's Date: 12/13/2018    History of Present Illness Patient is a 78 y/o female admitted 12/09/18 with exertional chest pain and SOB secondary to NSTEMI. Per cardiology notes, managing medically.  PMH includes PVD s/p femoral bypass, HTN, HLD, CKD, anemia, chronic pain syndrome, R THA.    PT Comments    Pt slowly progressing with mobility. Able to ambulate 82' with RW and frequent minA to maintain balance; DOE 3/4 with SpO2 90% post-ambulation. Pt requires minA to stand from lower chair surfaces, and modA to prevent LOB with minimal challenge. Continue to recommend SNF-level therapies to maximize functionsl mobility and independence prior to return home; pt now agreeable. Will follow acutely.   Follow Up Recommendations  SNF;Supervision for mobility/OOB     Equipment Recommendations  None recommended by PT    Recommendations for Other Services       Precautions / Restrictions Precautions Precautions: Fall Precaution Comments: Reports 7 falls in last month Restrictions Weight Bearing Restrictions: No    Mobility  Bed Mobility Overal bed mobility: Needs Assistance Bed Mobility: Supine to Sit     Supine to sit: Supervision        Transfers Overall transfer level: Needs assistance Equipment used: Rolling walker (2 wheeled) Transfers: Sit to/from Stand Sit to Stand: Min guard;Min assist         General transfer comment: Min guard stand from EOB to RW. MinA for trunk elevation standing from lower chair height; pt with posterior LOB when transitioning UEs from chair arm rests to RW requiring modA to maintain standing balance  Ambulation/Gait Ambulation/Gait assistance: Min assist Gait Distance (Feet): 50 Feet Assistive device: Rolling walker (2 wheeled) Gait Pattern/deviations: Step-to pattern;Decreased weight shift to right Gait velocity: Decreased Gait velocity  interpretation: <1.31 ft/sec, indicative of household ambulator General Gait Details: Slow gait with RW and minA for balance. Pt with baseline leg length discrepancy, walking on R foot toes to compensate (reports has shoes but did not care to don). Verbal cues to self-monitor activity tolerance as pt easily fatigued   Stairs             Wheelchair Mobility    Modified Rankin (Stroke Patients Only)       Balance Overall balance assessment: Needs assistance Sitting-balance support: No upper extremity supported;Feet supported Sitting balance-Leahy Scale: Good       Standing balance-Leahy Scale: Poor Standing balance comment: Reliant on UE support; unable to maintain balance with minimal challenge                            Cognition Arousal/Alertness: Awake/alert Behavior During Therapy: Flat affect Overall Cognitive Status: No family/caregiver present to determine baseline cognitive functioning                                 General Comments: Generally slow to respond, unsure if due to slowed processing or fatigue, or both      Exercises      General Comments General comments (skin integrity, edema, etc.): SpO2 90% on RA post-ambulation; DOE 3/4      Pertinent Vitals/Pain Pain Assessment: No/denies pain    Home Living                      Prior Function  PT Goals (current goals can now be found in the care plan section) Acute Rehab PT Goals Patient Stated Goal: Agreeable to rehab at SNF before return home PT Goal Formulation: With patient Time For Goal Achievement: 12/25/18 Potential to Achieve Goals: Good Progress towards PT goals: Progressing toward goals    Frequency    Min 2X/week      PT Plan Frequency needs to be updated    Co-evaluation              AM-PAC PT "6 Clicks" Mobility   Outcome Measure  Help needed turning from your back to your side while in a flat bed without using  bedrails?: None Help needed moving from lying on your back to sitting on the side of a flat bed without using bedrails?: None Help needed moving to and from a bed to a chair (including a wheelchair)?: A Little Help needed standing up from a chair using your arms (e.g., wheelchair or bedside chair)?: A Little Help needed to walk in hospital room?: A Little Help needed climbing 3-5 steps with a railing? : A Lot 6 Click Score: 19    End of Session Equipment Utilized During Treatment: Gait belt Activity Tolerance: Patient tolerated treatment well;Patient limited by fatigue Patient left: in chair;with call bell/phone within reach;with chair alarm set Nurse Communication: Mobility status PT Visit Diagnosis: Unsteadiness on feet (R26.81);Muscle weakness (generalized) (M62.81);Difficulty in walking, not elsewhere classified (R26.2)     Time: 1062-6948 PT Time Calculation (min) (ACUTE ONLY): 20 min  Charges:  $Gait Training: 8-22 mins                    Mabeline Caras, PT, DPT Acute Rehabilitation Services  Pager [phone] Office [place] 12/13/2018, 11:02 AM

## 2018-12-13 NOTE — Social Work (Signed)
[employer] has approved for patient to go to SNF, start date today, next review 12/15/18. Josem Kaufmann #[id number]. Will provide PDPM scores to the facility.  CSW met with patient at bedside and reviewed SNF bed offers. Patient chose [facility].   CSW to support with discharge when cleared by MD  Estanislado Emms, LCSW [phone]

## 2018-12-13 NOTE — Clinical Social Work Placement (Signed)
   CLINICAL SOCIAL WORK PLACEMENT  NOTE  Date:  12/13/2018  Patient Details  Name: Alyssa [name] MRN: [id number] Date of Birth: 1941/04/01  Clinical Social Work is seeking post-discharge placement for this patient at the [place] level of care (*CSW will initial, date and re-position this form in  chart as items are completed):  Yes   Patient/family provided with [facility] Work Department's list of facilities offering this level of care within the geographic area requested by the patient (or if unable, by the patient's family).  Yes   Patient/family informed of their freedom to choose among providers that offer the needed level of care, that participate in Medicare, Medicaid or managed care program needed by the patient, have an available bed and are willing to accept the patient.  Yes   Patient/family informed of [facility]'s ownership interest in [facility] and [facility], as well as of the fact that they are under no obligation to receive care at these facilities.  PASRR submitted to EDS on       PASRR number received on       Existing PASRR number confirmed on 12/12/18     FL2 transmitted to all facilities in geographic area requested by pt/family on 12/12/18     FL2 transmitted to all facilities within larger geographic area on       Patient informed that his/her managed care company has contracts with or will negotiate with certain facilities, including the following:  [facility]     Yes   Patient/family informed of bed offers received.  Patient chooses bed at [facility]     Physician recommends and patient chooses bed at      Patient to be transferred to [facility] on 12/13/18.  Patient to be transferred to facility by son     Patient family notified on 12/13/18 of transfer.  Name of family member notified:  [name], son     PHYSICIAN       Additional Comment:     _______________________________________________ Estanislado Emms, LCSW 12/13/2018, 12:25 PM

## 2018-12-13 NOTE — Care Management Important Message (Signed)
Important Message  Patient Details  Name: Alyssa [name] MRN: [id number] Date of Birth: 10-Oct-1941   Medicare Important Message Given:  Yes    Orbie Pyo 12/13/2018, 3:34 PM

## 2018-12-13 NOTE — Discharge Summary (Signed)
Physician Discharge Summary  Alyssa [name] JHE:[id number] DOB: 12-25-1940 DOA: 12/09/2018  PCP: Everardo Beals, NP  Admit date: 12/09/2018 Discharge date: 12/13/2018  Time spent: 25 minutes  Recommendations for Outpatient Follow-up:  1. Needs bmet and cbc 1 week 2. Needs OP follow up Cardiology 3. Hard Rx given for multiple psychotropic meds--would evalaute and de-escalate as possible 4. Suggest OP Discussion about pre-DM and maybe initiation of metformin once labs back  Discharge Diagnoses:  Principal Problem:   Elevated troponin level Active Problems:   PVD (peripheral vascular disease) with claudication (HCC)   Essential hypertension   Hyperlipidemia   CKD (chronic kidney disease) stage 4, GFR 15-29 ml/min (HCC)   Chronic pain syndrome   Non-ST elevation (NSTEMI) myocardial infarction [facility])   Chest pain   Dyspnea on exertion   Discharge Condition: improved  Diet recommendation: hh low salt  Filed Weights   12/12/18 0317 12/12/18 1200 12/13/18 0517  Weight: 44 kg 44 kg 44.6 kg    History of present illness:  78 y.o. AAF peripheral vascular disease s/p fem-fem bypass 2013 stg IV CKD , chronic pain syndrome,  essential hypertension,  hyperlipidemia,  Bipolar on numerous meds Ongoing tobacco  Admit with Dyspnea from [facility] 2/22 Found to have elevated troponin Dr. Einar Gip consulted  Hospital Course:  NSTEMI Peak troponin of 1.45. Downtrended. EKG with deep anterolateral t-wave inversions which are more pronounced than previous, otherwise sinus rhythm. Cardiology consulted and recommended conservative management. -Cardiology recommendations: Aspirin, Plavix, Imdur, Crestor I increased crestor to 40 on d/c  LLL airspace disease Seen on chest x-ray. Antibiotics held. No hypoxia.  Shortness of breath Patient with wheezing on exam. Prior smoker. -Albuterol prn  Hypotension Resolved.  Hypoglycemia Resolved  Diabetes mellitus New diagnosis with  hemoglobin A1C of 6.6%. Taking patient's age into consideration, would recommend diet control. Needs PCP follow-up  Diarrhea C. Diff was negative. Viral etiology was suspected.  CKD stage 4 Creatinine baseline.  Hyperlipidemia -Continue Crestor  PAD Patient is s/p femorofemoral bypass 2013  Chronic pain  Normocytic anemia Secondary to kidney disease  Calf pain/cramping -Venous duplex not required--she has claudication type pains  Procedures:  vq scan low prob IMPRESSIONS    1. The left ventricle has low normal systolic function, with an ejection fraction of 50-55%. The cavity size was normal. There is moderate concentric left ventricular hypertrophy. Left ventricular diastolic Doppler parameters are consistent with  pseudonormalization Elevated left ventricular end-diastolic pressure.  2. The right ventricle has normal systolic function. The cavity was normal. There is no increase in right ventricular wall thickness.  3. Left atrial size was mildly dilated.  4. The mitral valve is normal in structure.  5. The tricuspid valve is normal in structure.  6. The aortic valve is normal in structure.  7. The aortic root is normal in size and structure.  8. Mild hypokinesis of the left ventricular apical segment.    Consultations:  Cardiology-[facility]  Discharge Exam: Vitals:   12/13/18 0906 12/13/18 0926  BP:  (!) 151/94  Pulse:  75  Resp:    Temp:    SpO2: 93%     General: awake alert eating chikcn sandwhich no distres some occasional pains Cardiovascular: s1 s 2no m/r/g Respiratory: clear no added sound abd soft nt nd no rebound no guard Neuro intact  Discharge Instructions   Discharge Instructions    Diet - low sodium heart healthy   Complete by:  As directed    Increase activity slowly  Complete by:  As directed      Allergies as of 12/13/2018   No Known Allergies     Medication List    STOP taking these medications   amLODipine 10 MG  tablet Commonly known as:  NORVASC   cloNIDine 0.1 MG tablet Commonly known as:  CATAPRES   HYDROcodone-acetaminophen 5-325 MG tablet Commonly known as:  NORCO/VICODIN   labetalol 300 MG tablet Commonly known as:  NORMODYNE   multivitamin with minerals Tabs tablet     TAKE these medications   aspirin 81 MG EC tablet Take 1 tablet (81 mg total) by mouth daily. Start taking on:  December 14, 2018   citalopram 40 MG tablet Commonly known as:  CELEXA Take 1 tablet (40 mg total) by mouth daily for 4 days.   clonazePAM 1 MG tablet Commonly known as:  KLONOPIN Take 1 tablet (1 mg total) by mouth 2 (two) times daily. What changed:  how much to take   clopidogrel 75 MG tablet Commonly known as:  PLAVIX Take 1 tablet (75 mg total) by mouth daily. Start taking on:  December 14, 2018   ergocalciferol 1.25 MG (50000 UT) capsule Commonly known as:  VITAMIN D2 Take 50,000 Units by mouth every Monday.   ferrous sulfate 325 (65 FE) MG tablet Take 1 tablet (325 mg total) by mouth every Monday, Wednesday, and Friday. With breakfast   gabapentin 300 MG capsule Commonly known as:  NEURONTIN Take 1 capsule (300 mg total) by mouth 3 (three) times daily.   isosorbide mononitrate 60 MG 24 hr tablet Commonly known as:  IMDUR Take 1 tablet (60 mg total) by mouth daily. Start taking on:  December 14, 2018   lithium 300 MG tablet Take 1 tablet (300 mg total) by mouth daily.   metoprolol tartrate 25 MG tablet Commonly known as:  LOPRESSOR Take 1 tablet (25 mg total) by mouth 2 (two) times daily.   omeprazole 40 MG capsule Commonly known as:  PRILOSEC Take 1 capsule (40 mg total) by mouth daily.   rosuvastatin 40 MG tablet Commonly known as:  CRESTOR Take 1 tablet (40 mg total) by mouth daily. What changed:    medication strength  how much to take   temazepam 30 MG capsule Commonly known as:  RESTORIL Take 1 capsule (30 mg total) by mouth at bedtime.   traMADol 50 MG  tablet Commonly known as:  ULTRAM Take 1 tablet (50 mg total) by mouth every 6 (six) hours as needed. What changed:  reasons to take this      No Known Allergies Contact information for after-discharge care    Destination    [facility] Preferred SNF .   Service:  Skilled Nursing Contact information: [address] [place] [phone]               The results of significant diagnostics from this hospitalization (including imaging, microbiology, ancillary and laboratory) are listed below for reference.    Significant Diagnostic Studies: Dg Chest 2 View  Result Date: 12/09/2018 CLINICAL DATA:  Chest pain. EXAM: CHEST - 2 VIEW COMPARISON:  Chest x-ray dated September 18, 2017. FINDINGS: The heart size and mediastinal contours are within normal limits. Normal pulmonary vascularity. Lungs remain hyperinflated. No focal consolidation, pleural effusion, or pneumothorax. No acute osseous abnormality. IMPRESSION: No active cardiopulmonary disease. Electronically Signed   By: Titus Dubin M.D.   On: 12/09/2018 14:08   Dg Abd 1 View  Result Date:  12/10/2018 CLINICAL DATA:  Emesis. EXAM: ABDOMEN - 1 VIEW COMPARISON:  None. FINDINGS: Supine abdomen shows no gaseous bowel dilatation to suggest overt obstruction. Air is seen scattered along segments of the colon. Status post lumbar fusion. Right hip replacement noted. Coarse calcification over the central pelvis likely related to fibroid disease. IMPRESSION: Nonspecific bowel gas pattern. Electronically Signed   By: Misty Stanley M.D.   On: 12/10/2018 18:04   Nm Pulmonary Vent And Perf (v/q Scan)  Result Date: 12/10/2018 CLINICAL DATA:  Left substernal chest pain and shortness of breath. EXAM: NUCLEAR MEDICINE VENTILATION - PERFUSION LUNG SCAN TECHNIQUE: Ventilation images were obtained in multiple projections using inhaled aerosol Tc-68m DTPA. Perfusion images were obtained in multiple projections  after intravenous injection of Tc-67m MAA. RADIOPHARMACEUTICALS:  31.8 mCi of Tc-9m DTPA aerosol inhalation and 4.37 mCi Tc26m MAA IV COMPARISON:  Chest radiograph 12/09/2018 FINDINGS: Ventilation: Patchy radiotracer distribution throughout the lung parenchyma with scattered ventilation defects. Perfusion: There is a wedge shaped peripheral perfusion defects in the left mid lung, however it is matched with a ventilation defect with a similar distribution. Therefore, there is a low probability of this representing a pulmonary embolus, unless it is associated with atelectatic lung parenchyma. IMPRESSION: Wedge shaped peripheral perfusion defects in the left mid lung, which is matched with a ventilation defect with a similar distribution. Therefore, there is a low probability of this representing a pulmonary embolus, unless it is associated with atelectatic lung parenchyma. Patchy radiotracer distribution throughout the lung parenchyma, suggestive of COPD. Electronically Signed   By: Fidela Salisbury M.D.   On: 12/10/2018 09:42   Dg Chest Port 1 View  Result Date: 12/11/2018 CLINICAL DATA:  Increased shortness of breath EXAM: PORTABLE CHEST 1 VIEW COMPARISON:  12/10/2018 FINDINGS: There is hazy left lower lobe airspace disease concerning for atelectasis versus pneumonia. There is no pleural effusion or pneumothorax. The heart and mediastinal contours are unremarkable. 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 IMPRESSION: Hazy left lower lobe airspace disease concerning for atelectasis versus pneumonia. Electronically Signed   By: Kathreen Devoid   On: 12/11/2018 11:21   Dg Chest Portable 1 View  Result Date: 12/10/2018 CLINICAL DATA:  Hypoxia EXAM: PORTABLE CHEST 1 VIEW COMPARISON:  Chest radiograph from one day prior. FINDINGS: Stable cardiomediastinal silhouette with normal heart size. No pneumothorax. No pleural effusion. No pulmonary edema. Mild hazy left lung base opacity. IMPRESSION: Mild  hazy left lung base opacity, which could represent atelectasis, aspiration or pneumonia. Recommend short-term follow-up PA and lateral chest radiographs. Electronically Signed   By: Ilona Sorrel M.D.   On: 12/10/2018 18:04   Vas [place] Lower Extremity Venous (dvt)  Result Date: 12/12/2018  Lower Venous Study Indications: Pain.  Risk Factors: PAD. Performing Technologist: Toma Copier RVS  Examination Guidelines: A complete evaluation includes B-mode imaging, spectral Doppler, color Doppler, and power Doppler as needed of all accessible portions of each vessel. Bilateral testing is considered an integral part of a complete examination. Limited examinations for reoccurring indications may be performed as noted.  Right Venous Findings: +---------+---------------+---------+-----------+----------+-------+          CompressibilityPhasicitySpontaneityPropertiesSummary +---------+---------------+---------+-----------+----------+-------+ CFV      Full           Yes      Yes                          +---------+---------------+---------+-----------+----------+-------+ SFJ      Full                                                 +---------+---------------+---------+-----------+----------+-------+  FV Prox  Full           Yes      Yes                          +---------+---------------+---------+-----------+----------+-------+ FV Mid   Full                                                 +---------+---------------+---------+-----------+----------+-------+ FV DistalFull           Yes      Yes                          +---------+---------------+---------+-----------+----------+-------+ PFV      Full           Yes      Yes                          +---------+---------------+---------+-----------+----------+-------+ POP      Full           Yes      Yes                          +---------+---------------+---------+-----------+----------+-------+ PTV      Full                                                  +---------+---------------+---------+-----------+----------+-------+ PERO     Full                                                 +---------+---------------+---------+-----------+----------+-------+  Left Venous Findings: +---------+---------------+---------+-----------+----------+-------------------+          CompressibilityPhasicitySpontaneityPropertiesSummary             +---------+---------------+---------+-----------+----------+-------------------+ CFV      Full                                                             +---------+---------------+---------+-----------+----------+-------------------+ SFJ      Full                                                             +---------+---------------+---------+-----------+----------+-------------------+ FV Prox  Full                                                             +---------+---------------+---------+-----------+----------+-------------------+ FV Mid   Full                                                             +---------+---------------+---------+-----------+----------+-------------------+  FV DistalFull                                                             +---------+---------------+---------+-----------+----------+-------------------+ PFV      Full                                                             +---------+---------------+---------+-----------+----------+-------------------+ POP      Full                                                             +---------+---------------+---------+-----------+----------+-------------------+ PTV      Full                                                             +---------+---------------+---------+-----------+----------+-------------------+ PERO                                                  Unable to visualize                                                        well enough to                                                            fully evaluate due                                                        to size of the vein +---------+---------------+---------+-----------+----------+-------------------+    Summary: Right: There is no evidence of deep vein thrombosis in the lower extremity. No cystic structure found in the popliteal fossa. Left: There is no evidence of deep vein thrombosis in the lower extremity. No cystic structure found in the popliteal fossa.  *See table(s) above for measurements and observations. Electronically signed by Harold Barban MD on 12/12/2018 at 7:07:04 PM.    Final     Microbiology: Recent Results (from the past 240 hour(s))  C difficile quick scan w PCR reflex     Status: None   Collection Time:  12/11/18 12:33 AM  Result Value Ref Range Status   C Diff antigen NEGATIVE NEGATIVE Final   C Diff toxin NEGATIVE NEGATIVE Final   C Diff interpretation No C. difficile detected.  Final     Labs: Basic Metabolic Panel: Recent Labs  Lab 12/09/18 1301 12/10/18 0618 12/10/18 1719 12/11/18 0424 12/12/18 0358  NA 138 139 138 137 136  K 4.2 4.9 5.8* 4.7 4.4  CL 110 112* 116* 114* 112*  CO2 15* 17* 16* 17* 17*  GLUCOSE 162* 104* 71 125* 142*  BUN 27* 32* 35* 33* 31*  CREATININE 1.97* 2.03* 2.10* 1.79* 1.82*  CALCIUM 10.0 8.6* 8.6* 8.6* 9.0  MG  --   --   --   --  1.7  PHOS  --   --   --   --  2.8   Liver Function Tests: Recent Labs  Lab 12/12/18 0358  AST 20  ALT 12  ALKPHOS 66  BILITOT 0.7  PROT 6.0*  ALBUMIN 3.0*   No results for input(s): LIPASE, AMYLASE in the last 168 hours. No results for input(s): AMMONIA in the last 168 hours. CBC: Recent Labs  Lab 12/09/18 1301 12/11/18 0424 12/12/18 0358  WBC 7.1 9.3 8.3  HGB 12.0 9.9* 9.0*  HCT 35.8* 31.2* 27.9*  MCV 84.6 86.4 85.1  PLT 295 229 224   Cardiac Enzymes: Recent Labs  Lab 12/09/18 1723 12/10/18 0022 12/10/18 0618  12/10/18 1159  TROPONINI 1.45* 1.03* 0.66* 0.40*   BNP: BNP (last 3 results) Recent Labs    12/09/18 1301 12/10/18 0618 12/11/18 0424  BNP 736.6* 744.1* 471.1*    ProBNP (last 3 results) No results for input(s): PROBNP in the last 8760 hours.  CBG: Recent Labs  Lab 12/12/18 0807 12/12/18 1153 12/12/18 2127 12/13/18 0930 12/13/18 1135  GLUCAP 104* 85 102* 95 85       Signed:  Nita Sells MD   [facility] 12/13/2018, 12:05 PM

## 2018-12-24 ENCOUNTER — Other Ambulatory Visit: Payer: Self-pay

## 2018-12-24 ENCOUNTER — Inpatient Hospital Stay (HOSPITAL_COMMUNITY)
Admission: EM | Admit: 2018-12-24 | Discharge: 2019-01-09 | DRG: 177 | Disposition: A | Discharge status: Home Health | Payer: Medicare HMO | Attending: Internal Medicine | Admitting: Internal Medicine

## 2018-12-24 ENCOUNTER — Emergency Department (HOSPITAL_COMMUNITY): Payer: Medicare HMO

## 2018-12-24 DIAGNOSIS — R0600 Dyspnea, unspecified: Secondary | ICD-10-CM | POA: Diagnosis present

## 2018-12-24 DIAGNOSIS — Z66 Do not resuscitate: Secondary | ICD-10-CM | POA: Diagnosis present

## 2018-12-24 DIAGNOSIS — Z515 Encounter for palliative care: Secondary | ICD-10-CM | POA: Diagnosis present

## 2018-12-24 DIAGNOSIS — G894 Chronic pain syndrome: Secondary | ICD-10-CM | POA: Diagnosis present

## 2018-12-24 DIAGNOSIS — M1990 Unspecified osteoarthritis, unspecified site: Secondary | ICD-10-CM | POA: Diagnosis present

## 2018-12-24 DIAGNOSIS — R9431 Abnormal electrocardiogram [ECG] [EKG]: Secondary | ICD-10-CM | POA: Diagnosis not present

## 2018-12-24 DIAGNOSIS — E872 Acidosis: Secondary | ICD-10-CM | POA: Diagnosis present

## 2018-12-24 DIAGNOSIS — Z681 Body mass index (BMI) 19 or less, adult: Secondary | ICD-10-CM

## 2018-12-24 DIAGNOSIS — J9601 Acute respiratory failure with hypoxia: Secondary | ICD-10-CM | POA: Diagnosis present

## 2018-12-24 DIAGNOSIS — R296 Repeated falls: Secondary | ICD-10-CM | POA: Diagnosis present

## 2018-12-24 DIAGNOSIS — J690 Pneumonitis due to inhalation of food and vomit: Secondary | ICD-10-CM | POA: Diagnosis present

## 2018-12-24 DIAGNOSIS — Z7982 Long term (current) use of aspirin: Secondary | ICD-10-CM

## 2018-12-24 DIAGNOSIS — E1151 Type 2 diabetes mellitus with diabetic peripheral angiopathy without gangrene: Secondary | ICD-10-CM | POA: Diagnosis present

## 2018-12-24 DIAGNOSIS — E870 Hyperosmolality and hypernatremia: Secondary | ICD-10-CM | POA: Diagnosis present

## 2018-12-24 DIAGNOSIS — E871 Hypo-osmolality and hyponatremia: Secondary | ICD-10-CM | POA: Diagnosis present

## 2018-12-24 DIAGNOSIS — R4182 Altered mental status, unspecified: Secondary | ICD-10-CM

## 2018-12-24 DIAGNOSIS — E1122 Type 2 diabetes mellitus with diabetic chronic kidney disease: Secondary | ICD-10-CM | POA: Diagnosis present

## 2018-12-24 DIAGNOSIS — Z96641 Presence of right artificial hip joint: Secondary | ICD-10-CM | POA: Diagnosis present

## 2018-12-24 DIAGNOSIS — R0609 Other forms of dyspnea: Secondary | ICD-10-CM | POA: Diagnosis present

## 2018-12-24 DIAGNOSIS — G92 Toxic encephalopathy: Secondary | ICD-10-CM | POA: Diagnosis present

## 2018-12-24 DIAGNOSIS — I517 Cardiomegaly: Secondary | ICD-10-CM | POA: Diagnosis present

## 2018-12-24 DIAGNOSIS — I129 Hypertensive chronic kidney disease with stage 1 through stage 4 chronic kidney disease, or unspecified chronic kidney disease: Secondary | ICD-10-CM | POA: Diagnosis present

## 2018-12-24 DIAGNOSIS — Z781 Physical restraint status: Secondary | ICD-10-CM

## 2018-12-24 DIAGNOSIS — J189 Pneumonia, unspecified organism: Secondary | ICD-10-CM | POA: Diagnosis present

## 2018-12-24 DIAGNOSIS — Z79899 Other long term (current) drug therapy: Secondary | ICD-10-CM

## 2018-12-24 DIAGNOSIS — Y95 Nosocomial condition: Secondary | ICD-10-CM | POA: Diagnosis present

## 2018-12-24 DIAGNOSIS — R627 Adult failure to thrive: Secondary | ICD-10-CM | POA: Diagnosis present

## 2018-12-24 DIAGNOSIS — N184 Chronic kidney disease, stage 4 (severe): Secondary | ICD-10-CM | POA: Diagnosis present

## 2018-12-24 DIAGNOSIS — F0390 Unspecified dementia without behavioral disturbance: Secondary | ICD-10-CM | POA: Diagnosis present

## 2018-12-24 DIAGNOSIS — Z8249 Family history of ischemic heart disease and other diseases of the circulatory system: Secondary | ICD-10-CM

## 2018-12-24 DIAGNOSIS — Z87891 Personal history of nicotine dependence: Secondary | ICD-10-CM

## 2018-12-24 DIAGNOSIS — I214 Non-ST elevation (NSTEMI) myocardial infarction: Secondary | ICD-10-CM | POA: Diagnosis present

## 2018-12-24 DIAGNOSIS — N390 Urinary tract infection, site not specified: Secondary | ICD-10-CM

## 2018-12-24 DIAGNOSIS — E785 Hyperlipidemia, unspecified: Secondary | ICD-10-CM | POA: Diagnosis present

## 2018-12-24 DIAGNOSIS — G629 Polyneuropathy, unspecified: Secondary | ICD-10-CM | POA: Diagnosis present

## 2018-12-24 DIAGNOSIS — I4581 Long QT syndrome: Secondary | ICD-10-CM | POA: Diagnosis not present

## 2018-12-24 DIAGNOSIS — F419 Anxiety disorder, unspecified: Secondary | ICD-10-CM | POA: Diagnosis present

## 2018-12-24 DIAGNOSIS — G929 Unspecified toxic encephalopathy: Secondary | ICD-10-CM | POA: Diagnosis present

## 2018-12-24 DIAGNOSIS — W19XXXA Unspecified fall, initial encounter: Secondary | ICD-10-CM

## 2018-12-24 DIAGNOSIS — D631 Anemia in chronic kidney disease: Secondary | ICD-10-CM | POA: Diagnosis present

## 2018-12-24 DIAGNOSIS — E876 Hypokalemia: Secondary | ICD-10-CM | POA: Diagnosis not present

## 2018-12-24 DIAGNOSIS — E1165 Type 2 diabetes mellitus with hyperglycemia: Secondary | ICD-10-CM | POA: Diagnosis not present

## 2018-12-24 DIAGNOSIS — Z09 Encounter for follow-up examination after completed treatment for conditions other than malignant neoplasm: Secondary | ICD-10-CM

## 2018-12-24 DIAGNOSIS — F319 Bipolar disorder, unspecified: Secondary | ICD-10-CM | POA: Diagnosis present

## 2018-12-24 DIAGNOSIS — Z7189 Other specified counseling: Secondary | ICD-10-CM | POA: Diagnosis not present

## 2018-12-24 DIAGNOSIS — Z7902 Long term (current) use of antithrombotics/antiplatelets: Secondary | ICD-10-CM

## 2018-12-24 DIAGNOSIS — I252 Old myocardial infarction: Secondary | ICD-10-CM | POA: Diagnosis not present

## 2018-12-24 DIAGNOSIS — R1310 Dysphagia, unspecified: Secondary | ICD-10-CM | POA: Diagnosis present

## 2018-12-24 LAB — CBC WITH DIFFERENTIAL/PLATELET
Abs Immature Granulocytes: 0.11 10*3/uL — ABNORMAL HIGH (ref 0.00–0.07)
Basophils Absolute: 0.1 10*3/uL (ref 0.0–0.1)
Basophils Relative: 1 %
Eosinophils Absolute: 0.5 10*3/uL (ref 0.0–0.5)
Eosinophils Relative: 4 %
HCT: 30.5 % — ABNORMAL LOW (ref 36.0–46.0)
Hemoglobin: 9.7 g/dL — ABNORMAL LOW (ref 12.0–15.0)
Immature Granulocytes: 1 %
Lymphocytes Relative: 9 %
Lymphs Abs: 1.2 10*3/uL (ref 0.7–4.0)
MCH: 27.6 pg (ref 26.0–34.0)
MCHC: 31.8 g/dL (ref 30.0–36.0)
MCV: 86.6 fL (ref 80.0–100.0)
Monocytes Absolute: 1.1 10*3/uL — ABNORMAL HIGH (ref 0.1–1.0)
Monocytes Relative: 8 %
Neutro Abs: 10.8 10*3/uL — ABNORMAL HIGH (ref 1.7–7.7)
Neutrophils Relative %: 77 %
Platelets: 602 10*3/uL — ABNORMAL HIGH (ref 150–400)
RBC: 3.52 MIL/uL — ABNORMAL LOW (ref 3.87–5.11)
RDW: 15 % (ref 11.5–15.5)
WBC: 13.9 10*3/uL — ABNORMAL HIGH (ref 4.0–10.5)
nRBC: 0 % (ref 0.0–0.2)

## 2018-12-24 LAB — COMPREHENSIVE METABOLIC PANEL
ALT: 27 U/L (ref 0–44)
AST: 30 U/L (ref 15–41)
Albumin: 3.1 g/dL — ABNORMAL LOW (ref 3.5–5.0)
Alkaline Phosphatase: 111 U/L (ref 38–126)
Anion gap: 9 (ref 5–15)
BUN: 53 mg/dL — ABNORMAL HIGH (ref 8–23)
CO2: 22 mmol/L (ref 22–32)
Calcium: 9.5 mg/dL (ref 8.9–10.3)
Chloride: 110 mmol/L (ref 98–111)
Creatinine, Ser: 1.87 mg/dL — ABNORMAL HIGH (ref 0.44–1.00)
GFR calc Af Amer: 30 mL/min — ABNORMAL LOW (ref >60)
GFR calc non Af Amer: 26 mL/min — ABNORMAL LOW (ref >60)
Glucose, Bld: 104 mg/dL — ABNORMAL HIGH (ref 70–99)
Potassium: 4.6 mmol/L (ref 3.5–5.1)
Sodium: 141 mmol/L (ref 135–145)
Total Bilirubin: 0.7 mg/dL (ref 0.3–1.2)
Total Protein: 7.1 g/dL (ref 6.5–8.1)

## 2018-12-24 LAB — URINALYSIS, COMPLETE (UACMP) WITH MICROSCOPIC
Bilirubin Urine: NEGATIVE
Glucose, UA: NEGATIVE mg/dL
Ketones, ur: NEGATIVE mg/dL
Nitrite: NEGATIVE
Protein, ur: 100 mg/dL — AB
Specific Gravity, Urine: 1.011 (ref 1.005–1.030)
WBC, UA: >50 WBC/hpf — ABNORMAL HIGH (ref 0–5)
pH: 6 (ref 5.0–8.0)

## 2018-12-24 LAB — INFLUENZA PANEL BY PCR (TYPE A & B)
Influenza A By PCR: NEGATIVE
Influenza A By PCR: NEGATIVE
Influenza B By PCR: NEGATIVE
Influenza B By PCR: NEGATIVE

## 2018-12-24 LAB — AMMONIA: Ammonia: 10 umol/L (ref 9–35)

## 2018-12-24 LAB — LACTIC ACID, PLASMA: Lactic Acid, Venous: 0.9 mmol/L (ref 0.5–1.9)

## 2018-12-24 MED ORDER — ASPIRIN EC 81 MG PO TBEC
81.0000 mg | DELAYED_RELEASE_TABLET | Freq: Every day | ORAL | Status: DC
  Administered 2018-12-25 – 2019-01-09 (×12): 81 mg via ORAL
  Filled 2018-12-24 (×15): qty 1

## 2018-12-24 MED ORDER — ATORVASTATIN CALCIUM 40 MG PO TABS
80.0000 mg | ORAL_TABLET | Freq: Every day | ORAL | Status: DC
  Administered 2018-12-24 – 2019-01-08 (×5): 80 mg via ORAL
  Filled 2018-12-24 (×6): qty 2

## 2018-12-24 MED ORDER — VANCOMYCIN HCL IN DEXTROSE 1-5 GM/200ML-% IV SOLN: 1000.0000 mg | Freq: Once | INTRAVENOUS | Status: DC

## 2018-12-24 MED ORDER — SODIUM CHLORIDE 0.9 % IV BOLUS
500.0000 mL | Freq: Once | INTRAVENOUS | Status: AC
  Administered 2018-12-24: 500 mL via INTRAVENOUS

## 2018-12-24 MED ORDER — HEPARIN SODIUM (PORCINE) 5000 UNIT/ML IJ SOLN
5000.0000 [IU] | Freq: Three times a day (TID) | INTRAMUSCULAR | Status: DC
  Administered 2018-12-24 – 2019-01-08 (×32): 5000 [IU] via SUBCUTANEOUS
  Filled 2018-12-24 (×35): qty 1

## 2018-12-24 MED ORDER — LITHIUM CARBONATE 300 MG PO CAPS
300.0000 mg | ORAL_CAPSULE | Freq: Every day | ORAL | Status: DC
  Filled 2018-12-24: qty 1

## 2018-12-24 MED ORDER — VANCOMYCIN VARIABLE DOSE PER UNSTABLE RENAL FUNCTION (PHARMACIST DOSING): Status: DC

## 2018-12-24 MED ORDER — FERROUS SULFATE 325 (65 FE) MG PO TABS
325.0000 mg | ORAL_TABLET | ORAL | Status: DC
  Administered 2018-12-25 – 2019-01-08 (×6): 325 mg via ORAL
  Filled 2018-12-24 (×8): qty 1

## 2018-12-24 MED ORDER — TRAMADOL HCL 50 MG PO TABS: 50.0000 mg | ORAL_TABLET | Freq: Four times a day (QID) | ORAL | Status: DC | PRN

## 2018-12-24 MED ORDER — METOPROLOL TARTRATE 25 MG PO TABS
25.0000 mg | ORAL_TABLET | Freq: Two times a day (BID) | ORAL | Status: DC
  Administered 2018-12-24 – 2018-12-25 (×2): 25 mg via ORAL
  Filled 2018-12-24 (×2): qty 1

## 2018-12-24 MED ORDER — SODIUM CHLORIDE 0.9 % IV SOLN: 1.0000 g | Freq: Three times a day (TID) | INTRAVENOUS | Status: DC

## 2018-12-24 MED ORDER — ISOSORBIDE MONONITRATE ER 60 MG PO TB24
60.0000 mg | ORAL_TABLET | Freq: Every day | ORAL | Status: DC
  Administered 2018-12-25 – 2019-01-09 (×12): 60 mg via ORAL
  Filled 2018-12-24 (×15): qty 1

## 2018-12-24 MED ORDER — ROSUVASTATIN CALCIUM 10 MG PO TABS: 40.0000 mg | ORAL_TABLET | Freq: Every day | ORAL | Status: DC

## 2018-12-24 MED ORDER — LITHIUM CARBONATE 300 MG PO TABS: 300.0000 mg | ORAL_TABLET | Freq: Every day | ORAL | Status: DC

## 2018-12-24 MED ORDER — CLOPIDOGREL BISULFATE 75 MG PO TABS
75.0000 mg | ORAL_TABLET | Freq: Every day | ORAL | Status: DC
  Administered 2018-12-25 – 2019-01-09 (×11): 75 mg via ORAL
  Filled 2018-12-24 (×14): qty 1

## 2018-12-24 MED ORDER — TEMAZEPAM 15 MG PO CAPS
30.0000 mg | ORAL_CAPSULE | Freq: Every day | ORAL | Status: DC
  Administered 2018-12-24: 30 mg via ORAL
  Filled 2018-12-24: qty 2

## 2018-12-24 MED ORDER — CITALOPRAM HYDROBROMIDE 20 MG PO TABS
40.0000 mg | ORAL_TABLET | Freq: Every day | ORAL | Status: DC
  Administered 2018-12-24: 40 mg via ORAL
  Filled 2018-12-24: qty 2

## 2018-12-24 MED ORDER — SODIUM CHLORIDE 0.9 % IV SOLN
INTRAVENOUS | Status: DC
  Administered 2018-12-24 – 2018-12-25 (×2): via INTRAVENOUS

## 2018-12-24 MED ORDER — PANTOPRAZOLE SODIUM 40 MG PO TBEC
40.0000 mg | DELAYED_RELEASE_TABLET | Freq: Every day | ORAL | Status: DC
  Administered 2018-12-25 – 2019-01-09 (×12): 40 mg via ORAL
  Filled 2018-12-24 (×14): qty 1

## 2018-12-24 MED ORDER — ASPIRIN 81 MG PO TBEC: 81.0000 mg | DELAYED_RELEASE_TABLET | Freq: Every day | ORAL | Status: DC

## 2018-12-24 MED ORDER — PIPERACILLIN-TAZOBACTAM 3.375 G IVPB 30 MIN
3.3750 g | Freq: Once | INTRAVENOUS | Status: AC
  Administered 2018-12-24: 3.375 g via INTRAVENOUS
  Filled 2018-12-24: qty 50

## 2018-12-24 MED ORDER — CLONAZEPAM 1 MG PO TABS
1.0000 mg | ORAL_TABLET | Freq: Two times a day (BID) | ORAL | Status: DC
  Administered 2018-12-24 – 2018-12-25 (×2): 1 mg via ORAL
  Filled 2018-12-24 (×2): qty 1

## 2018-12-24 MED ORDER — GABAPENTIN 300 MG PO CAPS
300.0000 mg | ORAL_CAPSULE | Freq: Three times a day (TID) | ORAL | Status: DC
  Administered 2018-12-24 – 2018-12-25 (×2): 300 mg via ORAL
  Filled 2018-12-24 (×2): qty 1

## 2018-12-24 MED ORDER — SODIUM CHLORIDE 0.9 % IV SOLN
1.0000 g | INTRAVENOUS | Status: DC
  Administered 2018-12-25 – 2018-12-28 (×4): 1 g via INTRAVENOUS
  Filled 2018-12-24 (×4): qty 1

## 2018-12-24 NOTE — Progress Notes (Signed)
A consult was received from an ED physician for vancomycin and zosyn per pharmacy dosing.  The patient's profile has been reviewed for ht/wt/allergies/indication/available labs.     A one time order has been placed for vancomycin 1000 mg IV x1 and zosyn 3.375 gm IV x1 over 30 min.  Further antibiotics/pharmacy consults should be ordered by admitting physician if indicated.                       Thank you, Lynelle Doctor 12/24/2018  3:40 PM

## 2018-12-24 NOTE — ED Notes (Signed)
Bed: NU27 Expected date:  Expected time:  Means of arrival:  Comments: AMS- multiple falls since yesterday

## 2018-12-24 NOTE — Progress Notes (Signed)
Pt has arrived to Room 1502 from ED with sitter. Confused, responsive.

## 2018-12-24 NOTE — ED Notes (Addendum)
Pt altered and non-cooperative.  Unable to obtain blood-work and cultures at this time (2 RN and 1 NT attempt).  Will try again when pt is calmer

## 2018-12-24 NOTE — ED Provider Notes (Signed)
[facility] DEPT Provider Note   CSN: [id number] Arrival date & time: 12/24/18  1252    History   Chief Complaint Chief Complaint  Patient presents with  . Fall  . Altered Mental Status    HPI Alyssa [name] is a 78 y.o. female with a past medical history of CKD, hypertension, hyperlipidemia, peripheral vascular disease, NSTEMI, who presents today for evaluation of altered mental status.  She is reportedly coming from [facility].  She reportedly has had 8 falls in the past 12 to 24 hours per EMS.  Staff was reportedly unable to give a baseline mental status, other than that she is normally ambulatory and has had decreased mobility in the past 24 hours.  Review of paper shows that she has been draining treated with Cipro and IM Rocephin for a UTI since approximately the third.  She reportedly had labs drawn around the sixth that showed elevated potassium and worsening kidney function.     HPI  Past Medical History:  Diagnosis Date  . Anxiety   . Arthritis   . Chest pain   . Chronic kidney disease   . Elevated troponin 12/13/2018  . Headache(784.0)   . Hyperlipidemia   . Hypertension   . Joint pain   . Leg pain   . Peripheral neuropathy   . Peripheral vascular disease [facility])     Patient Active Problem List   Diagnosis Date Noted  . Non-ST elevation (NSTEMI) myocardial infarction ([place])   . Chest pain   . Dyspnea on exertion   . Elevated troponin level 12/09/2018  . Chronic migraine 03/27/2018  . Lumbar radiculopathy 03/27/2018  . Gait abnormality 03/27/2018  . CKD (chronic kidney disease) stage 4, GFR 15-29 ml/min (HCC) 12/28/2016  . Normocytic anemia 12/28/2016  . Chronic pain syndrome 12/28/2016  . Benzodiazepine withdrawal ([place]) 12/28/2016  . Benzodiazepine withdrawal without complication ([place]) 93/73/4287  . Chronic right shoulder pain 12/06/2016  . Fall 09/17/2016  . Fracture of humeral head, closed, right, initial  encounter 09/17/2016  . Rib fractures 09/17/2016  . Multiple falls 09/17/2016  . Severe muscle deconditioning 09/17/2016  . Failure to thrive in adult 09/17/2016  . Melena 04/25/2016  . Essential hypertension 04/25/2016  . Anxiety state 04/25/2016  . Tobacco abuse 04/25/2016  . Hyperlipidemia 04/25/2016  . Syncope 04/24/2016  . Syncope and collapse 04/24/2016  . Gait difficulty 01/12/2016  . Foot drop, left 01/12/2016  . Severe recurrent major depression without psychotic features ([place]) 08/28/2015  . Spondylolisthesis at L4-L5 level 07/30/2015  . PVD (peripheral vascular disease) with claudication ([place]) 07/29/2014  . Weakness-Bilateral arm/leg 07/29/2014  . Numbness-left leg 07/29/2014  . Swelling of limb-Legs 07/29/2014  . Atherosclerosis of native arteries of the extremities with intermittent claudication 09/30/2011    Past Surgical History:  Procedure Laterality Date  . ABDOMINAL ANGIOGRAM N/A 10/29/2011   Procedure: ABDOMINAL ANGIOGRAM;  Surgeon: Elam Dutch, MD;  Location: [facility] CATH LAB;  Service: Cardiovascular;  Laterality: N/A;  . BACK SURGERY    . FEMORAL-FEMORAL BYPASS GRAFT  08/31/2010  . JOINT REPLACEMENT Right 2012   Hip  . LOWER EXTREMITY ANGIOGRAPHY  06/14/2017   Procedure: Lower Extremity Angiography;  Surgeon: Adrian Prows, MD;  Location: [facility] CV LAB;  Service: Cardiovascular;;  Bilateral limited angio performed  . RENAL ANGIOGRAPHY N/A 06/14/2017   Procedure: Renal Angiography;  Surgeon: Adrian Prows, MD;  Location: [facility] CV LAB;  Service: Cardiovascular;  Laterality: N/A;  . TOTAL HIP ARTHROPLASTY  right     OB History   No obstetric history on file.      Home Medications    Prior to Admission medications   Medication Sig Start Date End Date Taking? Authorizing Provider  aspirin EC 81 MG EC tablet Take 1 tablet (81 mg total) by mouth daily. 12/14/18  Yes Nita Sells, MD  ciprofloxacin (CIPRO) 500 MG tablet Take 500 mg by mouth  every 12 (twelve) hours. 12/22/18 12/29/18 Yes [provider]  clonazePAM (KLONOPIN) 1 MG tablet Take 1 tablet (1 mg total) by mouth 2 (two) times daily. 12/13/18  Yes Nita Sells, MD  clopidogrel (PLAVIX) 75 MG tablet Take 1 tablet (75 mg total) by mouth daily. 12/14/18  Yes Nita Sells, MD  ergocalciferol (VITAMIN D2) 50000 units capsule Take 50,000 Units by mouth every Monday.    Yes [provider]  ferrous sulfate 325 (65 FE) MG tablet Take 1 tablet (325 mg total) by mouth every Monday, Wednesday, and Friday. With breakfast 12/13/18  Yes Nita Sells, MD  gabapentin (NEURONTIN) 300 MG capsule Take 1 capsule (300 mg total) by mouth 3 (three) times daily. 12/13/18  Yes Nita Sells, MD  isosorbide mononitrate (IMDUR) 60 MG 24 hr tablet Take 1 tablet (60 mg total) by mouth daily. 12/14/18  Yes Nita Sells, MD  lithium 300 MG tablet Take 1 tablet (300 mg total) by mouth daily. 12/13/18  Yes Nita Sells, MD  metoprolol tartrate (LOPRESSOR) 25 MG tablet Take 1 tablet (25 mg total) by mouth 2 (two) times daily. 12/13/18  Yes Nita Sells, MD  omeprazole (PRILOSEC) 40 MG capsule Take 1 capsule (40 mg total) by mouth daily. 02/08/18  Yes Ladene Artist, MD  rosuvastatin (CRESTOR) 40 MG tablet Take 1 tablet (40 mg total) by mouth daily. 12/13/18  Yes Nita Sells, MD  sodium polystyrene (KAYEXALATE) 15 GM/60ML suspension Take 15 g by mouth 2 (two) times daily. 12/23/18 12/24/18 Yes [provider]  temazepam (RESTORIL) 30 MG capsule Take 1 capsule (30 mg total) by mouth at bedtime. 12/13/18  Yes Nita Sells, MD  traMADol (ULTRAM) 50 MG tablet Take 1 tablet (50 mg total) by mouth every 6 (six) hours as needed. Patient taking differently: Take 50 mg by mouth every 6 (six) hours as needed for moderate pain.  11/29/17  Yes Marcial Pacas, MD  citalopram (CELEXA) 40 MG tablet Take 1 tablet (40 mg total) by mouth daily for 4  days. 12/13/18 12/17/18  Nita Sells, MD    Family History Family History  Problem Relation Age of Onset  . Heart attack Mother   . Heart disease Mother   . Hyperlipidemia Mother   . Hypertension Mother   . Heart attack Father   . Heart disease Father        Before age 33  . Hyperlipidemia Father   . Hypertension Father   . Cancer Sister        brain tumor  . Aneurysm Brother        brain  . Colon cancer Neg Hx   . Liver cancer Neg Hx     Social History Social History   Tobacco Use  . Smoking status: Former Smoker    Packs/day: 0.25    Types: Cigarettes    Last attempt to quit: 11/22/2018    Years since quitting: 0.0  . Smokeless tobacco: Never Used  . Tobacco comment: 5 cigs a day   Substance Use Topics  . Alcohol use: No  .  Drug use: No     Allergies   Patient has no known allergies.   Review of Systems Review of Systems  Unable to perform ROS: Mental status change     Physical Exam Updated Vital Signs BP (!) 176/146 Comment: pt altered and won't stay still  Pulse (!) 57   Temp 98.1 F (36.7 C) (Rectal)   Resp 18   SpO2 100%   Physical Exam Vitals signs and nursing note reviewed.  Constitutional:      General: She is not in acute distress.    Appearance: She is well-developed. She is ill-appearing (Cronically ill appearing). She is not diaphoretic.  HENT:     Head: Normocephalic and atraumatic.     Mouth/Throat:     Mouth: Mucous membranes are dry.  Eyes:     General: No scleral icterus.       Right eye: No discharge.        Left eye: No discharge.     Conjunctiva/sclera: Conjunctivae normal.  Neck:     Musculoskeletal: Normal range of motion and neck supple.  Cardiovascular:     Rate and Rhythm: Normal rate and regular rhythm.     Pulses: Normal pulses.     Heart sounds: Normal heart sounds.  Pulmonary:     Effort: Pulmonary effort is normal. No respiratory distress.     Breath sounds: No stridor.  Abdominal:     General: There  is no distension.  Musculoskeletal:        General: No deformity.  Skin:    General: Skin is warm and dry.  Neurological:     Mental Status: She is alert.     Motor: No abnormal muscle tone.     Comments: She is awake, not oriented to person, place, or time.  Speech is repetitive, keeps saying the word hospital and cold. Facial is symmetrical.   Psychiatric:     Comments: Agitated, moving around, picking it cords      ED Treatments / Results  Labs (all labs ordered are listed, but only abnormal results are displayed) Labs Reviewed  COMPREHENSIVE METABOLIC PANEL - Abnormal; Notable for the following components:      Result Value   Glucose, Bld 104 (*)    BUN 53 (*)    Creatinine, Ser 1.87 (*)    Albumin 3.1 (*)    GFR calc non Af Amer 26 (*)    GFR calc Af Amer 30 (*)    All other components within normal limits  CBC WITH DIFFERENTIAL/PLATELET - Abnormal; Notable for the following components:   WBC 13.9 (*)    RBC 3.52 (*)    Hemoglobin 9.7 (*)    HCT 30.5 (*)    Platelets 602 (*)    Neutro Abs 10.8 (*)    Monocytes Absolute 1.1 (*)    Abs Immature Granulocytes 0.11 (*)    All other components within normal limits  URINALYSIS, COMPLETE (UACMP) WITH MICROSCOPIC - Abnormal; Notable for the following components:   APPearance HAZY (*)    Hgb urine dipstick SMALL (*)    Protein, ur 100 (*)    Leukocytes,Ua MODERATE (*)    WBC, UA >50 (*)    Bacteria, UA RARE (*)    All other components within normal limits  URINE CULTURE  CULTURE, BLOOD (ROUTINE X 2)  CULTURE, BLOOD (ROUTINE X 2)  AMMONIA  LACTIC ACID, PLASMA  INFLUENZA PANEL BY PCR (TYPE A & B)    EKG EKG  Interpretation  Date/Time:  Sunday December 24 2018 13:19:52 EDT Ventricular Rate:  68 PR Interval:    QRS Duration: 87 QT Interval:  521 QTC Calculation: 555 R Axis:   69 Text Interpretation:  Sinus rhythm Probable LVH with secondary repol abnrm Prolonged QT interval Abnormal ekg Confirmed by Ripley Fraise [phone]) on 12/24/2018 2:00:13 PM   Radiology Ct Head Wo Contrast  Result Date: 12/24/2018 CLINICAL DATA:  Multiple falls and altered mental status EXAM: CT HEAD WITHOUT CONTRAST CT CERVICAL SPINE WITHOUT CONTRAST TECHNIQUE: Multidetector CT imaging of the head and cervical spine was performed following the standard protocol without intravenous contrast. Multiplanar CT image reconstructions of the cervical spine were also generated. COMPARISON:  06/23/2017 FINDINGS: CT HEAD FINDINGS Brain: No evidence of acute infarction, hemorrhage, hydrocephalus, extra-axial collection or mass lesion/mass effect. Mild borderline moderate patchy low-density in the cerebral white matter attributed to chronic small vessel ischemia given vascular risk factors and stability. Mild for age cerebral volume loss Vascular: Atherosclerotic calcification Skull: Negative Sinuses/Orbits: Postoperative right ethmoid sinuses. Bilateral cataract resection. No acute finding CT CERVICAL SPINE FINDINGS Alignment: Degenerative anterolisthesis at C4-5 and C7-T1 measuring up to 4 mm. Skull base and vertebrae: No evidence of acute fracture. Marked motion degradation at the level of T2 and T3 Soft tissues and spinal canal: No gross canal hematoma or prevertebral edema. Atherosclerotic calcification. Disc levels: Generalized disc degeneration particularly severe at C7-T1 and T1-2 where there is disc collapse and sclerosis. Diffuse facet arthropathy most advanced at C7-T1 where there is joint distortion and anterolisthesis. Ankylosis has occurred at C6-7. Upper chest: Emphysema with motion. IMPRESSION: No evidence of acute intracranial or cervical spine injury. Electronically Signed   By: Monte Fantasia M.D.   On: 12/24/2018 15:29   Ct Cervical Spine Wo Contrast  Result Date: 12/24/2018 CLINICAL DATA:  Multiple falls and altered mental status EXAM: CT HEAD WITHOUT CONTRAST CT CERVICAL SPINE WITHOUT CONTRAST TECHNIQUE: Multidetector CT imaging  of the head and cervical spine was performed following the standard protocol without intravenous contrast. Multiplanar CT image reconstructions of the cervical spine were also generated. COMPARISON:  06/23/2017 FINDINGS: CT HEAD FINDINGS Brain: No evidence of acute infarction, hemorrhage, hydrocephalus, extra-axial collection or mass lesion/mass effect. Mild borderline moderate patchy low-density in the cerebral white matter attributed to chronic small vessel ischemia given vascular risk factors and stability. Mild for age cerebral volume loss Vascular: Atherosclerotic calcification Skull: Negative Sinuses/Orbits: Postoperative right ethmoid sinuses. Bilateral cataract resection. No acute finding CT CERVICAL SPINE FINDINGS Alignment: Degenerative anterolisthesis at C4-5 and C7-T1 measuring up to 4 mm. Skull base and vertebrae: No evidence of acute fracture. Marked motion degradation at the level of T2 and T3 Soft tissues and spinal canal: No gross canal hematoma or prevertebral edema. Atherosclerotic calcification. Disc levels: Generalized disc degeneration particularly severe at C7-T1 and T1-2 where there is disc collapse and sclerosis. Diffuse facet arthropathy most advanced at C7-T1 where there is joint distortion and anterolisthesis. Ankylosis has occurred at C6-7. Upper chest: Emphysema with motion. IMPRESSION: No evidence of acute intracranial or cervical spine injury. Electronically Signed   By: Monte Fantasia M.D.   On: 12/24/2018 15:29   Dg Chest Portable 1 View  Result Date: 12/24/2018 CLINICAL DATA:  Multiple falls and altered mental status. EXAM: PORTABLE CHEST 1 VIEW COMPARISON:  12/11/2018 FINDINGS: Midline trachea. Cardiomegaly accentuated by AP portable technique. No pleural effusion or pneumothorax. Increase in left base airspace disease. High-riding humeral heads. IMPRESSION: 1. Increase in left base airspace disease,  suspicious for infection or aspiration. 2. Cardiomegaly without congestive  failure. Electronically Signed   By: Abigail Miyamoto M.D.   On: 12/24/2018 14:34    Procedures Procedures (including critical care time)  Medications Ordered in ED Medications  vancomycin (VANCOCIN) IVPB 1000 mg/200 mL premix (has no administration in time range)  piperacillin-tazobactam (ZOSYN) IVPB 3.375 g (has no administration in time range)  sodium chloride 0.9 % bolus 500 mL (500 mLs Intravenous New Bag/Given 12/24/18 1556)     Initial Impression / Assessment and Plan / ED Course  I have reviewed the triage vital signs and the nursing notes.  Pertinent labs & imaging results that were available during my care of the patient were reviewed by me and considered in my medical decision making (see chart for details).  Clinical Course as of Dec 24 1555  Sun Dec 24, 2018  1408 No fever  Temp: 98.1 F (36.7 C) [EH]  1428 Informed by RN That patient will not stay still for lab draws.  Asked them to attempt to draw labs off IV, Dr. Venora Maples to see patient.    [EH]  1555 Spoke with hospitalist who will admit.   [EH]    Clinical Course User Index [EH] Lorin Glass, PA-C      Patient presents today from her facility for evaluation of altered mental status, decline in function along with reportedly 8 falls in the past 24 hours.  CT head and neck were obtained without evidence of acute abnormalities.  According to paperwork she is recently been treated with IM Rocephin and p.o. Cipro for a urinary tract infection.  In and out catheterized urine was obtained showing 100 protein, small blood, moderate leukocytes, with rare bacteria concerning for continued infection.  Chest x-ray also shows increased in left base airspace disease suspicious for infection or aspiration.  White count is elevated at 13.9 with hemoglobin at 9.7, assistant with her baseline anemia.  Kidney function shows reduced GFR and increased creatinine, again consistent with her normal levels.  She is not tachycardic or  tachypneic and is afebrile with normal lactic, therefore not meeting sepsis criteria.  She was given 500 cc fluid bolus in addition to Vanco and Zosyn being ordered.  This patient was seen as a shared visit with Dr. Venora Maples.  I spoke with hospitalist who agreed to admit patient.  Blood cultures were ordered.  Final Clinical Impressions(s) / ED Diagnoses   Final diagnoses:  Altered mental status, unspecified altered mental status type  Lower urinary tract infectious disease  Fall, initial encounter  Aspiration pneumonia of right lower lobe, unspecified aspiration pneumonia type [facility])    ED Discharge Orders    None       Ollen Gross 12/24/18 1559    Jola Schmidt, MD 12/24/18 1607    Jola Schmidt, MD 12/24/18 [phone]

## 2018-12-24 NOTE — Progress Notes (Signed)
PHARMACY NOTE:  ANTIMICROBIAL RENAL DOSAGE ADJUSTMENT  Current antimicrobial regimen includes a mismatch between antimicrobial dosage and estimated renal function.  As per policy approved by the Pharmacy & Therapeutics and Medical Executive Committees, the antimicrobial dosage will be adjusted accordingly.  Current antimicrobial dosage:  Cefepime 1gm IV q8h  Indication: HCAP  Renal Function:  Estimated Creatinine Clearance: 18.3 mL/min (A) (by C-G formula based on SCr of 1.87 mg/dL (H)). []      On intermittent HD, scheduled: []      On CRRT    Antimicrobial dosage has been changed to:  Cefepime 1gm IV q24h  Additional comments:   Thank you for allowing pharmacy to be a part of this patient's care.  Dolly Rias RPh 12/24/2018, 4:51 PM Pager [phone]

## 2018-12-24 NOTE — H&P (Addendum)
Alyssa [name] is an 78 y.o. female.   Chief Complaint: Altered mental status. HPI: The patient is a 78 yr old woman who was discharged from a 5 day inpatient stay at [facility] to SNF on 12/13/2018. She is sent to the ED at [facility] today be the facility for the above chief complaints. She has been falling a lot in the last 2 days. She is confused and somewhat agitated. The facility had concerns that she had a UTI and/or pneumonia. The patient carries a past medical history significant for CKD IV, chronic pain, hypertension, hyperlipidemia, bipolar disorder, and peripheral vascular disease. The patient had been evaluated and treated for chest pain with elevated troponin in [facility]. She was found to have suffered an NSTEMI for which cardiology had recommended conservative management. She was discharged on ASA, Plavix, Imdur, and an increased dose of Crestor.  In the ED the patient was agitated, confused, and required a sitter for her safety. Temperature was 98.1. Respiratory rate was 18. Heart rate was 57. Blood pressure was 168/73. She was saturating 100% on room air. This quickly dropped to 78%.  Sodium was 141, potassium was 4.6. CO2 was 22. Creatinine was 1.87. LFT's were within normal limits. Lactic acid was 0.9. WBC was 13.9. Hemoglobin was 9.7. Hematocrit was 30.5. Platelets are elevated at 602.  CXR demonstrates a left basilar infiltrate and cardiomegaly. CT head and C- spine demonstrated no evidence of acute intracranial or cervical spine injury. EKG demonstrated no ischemic changes, prolonged QT in the settin gof normal sinus rhythm.  Past Medical History:  Diagnosis Date  . Anxiety   . Arthritis   . Chest pain   . Chronic kidney disease   . Elevated troponin 12/13/2018  . Headache(784.0)   . Hyperlipidemia   . Hypertension   . Joint pain   . Leg pain   . Peripheral neuropathy   . Peripheral vascular disease [facility])     Past Surgical History:  Procedure Laterality Date   . ABDOMINAL ANGIOGRAM N/A 10/29/2011   Procedure: ABDOMINAL ANGIOGRAM;  Surgeon: Elam Dutch, MD;  Location: [facility] CATH LAB;  Service: Cardiovascular;  Laterality: N/A;  . BACK SURGERY    . FEMORAL-FEMORAL BYPASS GRAFT  08/31/2010  . JOINT REPLACEMENT Right 2012   Hip  . LOWER EXTREMITY ANGIOGRAPHY  06/14/2017   Procedure: Lower Extremity Angiography;  Surgeon: Adrian Prows, MD;  Location: [place] CV LAB;  Service: Cardiovascular;;  Bilateral limited angio performed  . RENAL ANGIOGRAPHY N/A 06/14/2017   Procedure: Renal Angiography;  Surgeon: Adrian Prows, MD;  Location: [place] CV LAB;  Service: Cardiovascular;  Laterality: N/A;  . TOTAL HIP ARTHROPLASTY     right    Family History  Problem Relation Age of Onset  . Heart attack Mother   . Heart disease Mother   . Hyperlipidemia Mother   . Hypertension Mother   . Heart attack Father   . Heart disease Father        Before age 76  . Hyperlipidemia Father   . Hypertension Father   . Cancer Sister        brain tumor  . Aneurysm Brother        brain  . Colon cancer Neg Hx   . Liver cancer Neg Hx    Social History:  reports that she quit smoking about 4 weeks ago. Her smoking use included cigarettes. She smoked 0.25 packs per day. She has never used smokeless tobacco. She reports  that she does not drink alcohol or use drugs. Medications Prior to Admission  Medication Sig Dispense Refill  . aspirin EC 81 MG EC tablet Take 1 tablet (81 mg total) by mouth daily.    . ciprofloxacin (CIPRO) 500 MG tablet Take 500 mg by mouth every 12 (twelve) hours.    . clonazePAM (KLONOPIN) 1 MG tablet Take 1 tablet (1 mg total) by mouth 2 (two) times daily. 4 tablet 0  . clopidogrel (PLAVIX) 75 MG tablet Take 1 tablet (75 mg total) by mouth daily.    . ergocalciferol (VITAMIN D2) 50000 units capsule Take 50,000 Units by mouth every Monday.     . ferrous sulfate 325 (65 FE) MG tablet Take 1 tablet (325 mg total) by mouth every Monday, Wednesday,  and Friday. With breakfast 30 tablet 0  . gabapentin (NEURONTIN) 300 MG capsule Take 1 capsule (300 mg total) by mouth 3 (three) times daily. 4 capsule 0  . isosorbide mononitrate (IMDUR) 60 MG 24 hr tablet Take 1 tablet (60 mg total) by mouth daily. 30 tablet 3  . lithium 300 MG tablet Take 1 tablet (300 mg total) by mouth daily. 4 tablet 0  . metoprolol tartrate (LOPRESSOR) 25 MG tablet Take 1 tablet (25 mg total) by mouth 2 (two) times daily.    Marland Kitchen omeprazole (PRILOSEC) 40 MG capsule Take 1 capsule (40 mg total) by mouth daily. 90 capsule 3  . rosuvastatin (CRESTOR) 40 MG tablet Take 1 tablet (40 mg total) by mouth daily. 4 tablet 0  . sodium polystyrene (KAYEXALATE) 15 GM/60ML suspension Take 15 g by mouth 2 (two) times daily.    . temazepam (RESTORIL) 30 MG capsule Take 1 capsule (30 mg total) by mouth at bedtime. 5 capsule 0  . traMADol (ULTRAM) 50 MG tablet Take 1 tablet (50 mg total) by mouth every 6 (six) hours as needed. (Patient taking differently: Take 50 mg by mouth every 6 (six) hours as needed for moderate pain. ) 30 tablet 5  . citalopram (CELEXA) 40 MG tablet Take 1 tablet (40 mg total) by mouth daily for 4 days. 4 tablet 0    Allergies: No Known Allergies  Pertinent items are noted in HPI.   General appearance: cachectic, uncooperative and agitated and disoriented. Head: Normocephalic, without obvious abnormality, atraumatic Eyes: conjunctivae/corneas clear. PERRL, EOM's intact. Fundi benign. Throat: lips, mucosa, and tongue normal; teeth and gums normal Neck: no adenopathy, no carotid bruit, no JVD, supple, symmetrical, trachea midline and thyroid not enlarged, symmetric, no tenderness/mass/nodules Resp: Diminished breath sounds bilaterally. No wheezes, rales, or rhonchi are appreciated. There is increased work of breathing. No tactile fremitus. Chest wall: no tenderness Cardio: regular rate and rhythm, S1, S2 normal, no murmur, click, rub or gallop GI: soft, non-tender;  bowel sounds normal; no masses,  no organomegaly Extremities: extremities normal, atraumatic, no cyanosis or edema Pulses: 2+ and symmetric Skin: Skin color, texture, turgor normal. No rashes or lesions Lymph nodes: Cervical, supraclavicular, and axillary nodes normal. Neurologic: The patient is agitated and confused. She is moving all extremities. CN II - XII are grossly intact.  Results for orders placed or performed during the hospital encounter of 12/24/18 (from the past 48 hour(s))  Comprehensive metabolic panel     Status: Abnormal   Collection Time: 12/24/18  1:11 PM  Result Value Ref Range   Sodium 141 135 - 145 mmol/L   Potassium 4.6 3.5 - 5.1 mmol/L   Chloride 110 98 - 111 mmol/L  CO2 22 22 - 32 mmol/L   Glucose, Bld 104 (H) 70 - 99 mg/dL   BUN 53 (H) 8 - 23 mg/dL   Creatinine, Ser 1.87 (H) 0.44 - 1.00 mg/dL   Calcium 9.5 8.9 - 10.3 mg/dL   Total Protein 7.1 6.5 - 8.1 g/dL   Albumin 3.1 (L) 3.5 - 5.0 g/dL   AST 30 15 - 41 U/L   ALT 27 0 - 44 U/L   Alkaline Phosphatase 111 38 - 126 U/L   Total Bilirubin 0.7 0.3 - 1.2 mg/dL   GFR calc non Af Amer 26 (L) >60 mL/min   GFR calc Af Amer 30 (L) >60 mL/min   Anion gap 9 5 - 15    Comment: Performed at [facility], [place] [address] [place], [place] [address]  CBC WITH DIFFERENTIAL     Status: Abnormal   Collection Time: 12/24/18  1:11 PM  Result Value Ref Range   WBC 13.9 (H) 4.0 - 10.5 K/uL   RBC 3.52 (L) 3.87 - 5.11 MIL/uL   Hemoglobin 9.7 (L) 12.0 - 15.0 g/dL   HCT 30.5 (L) 36.0 - 46.0 %   MCV 86.6 80.0 - 100.0 fL   MCH 27.6 26.0 - 34.0 pg   MCHC 31.8 30.0 - 36.0 g/dL   RDW 15.0 11.5 - 15.5 %   Platelets 602 (H) 150 - 400 K/uL   nRBC 0.0 0.0 - 0.2 %   Neutrophils Relative % 77 %   Neutro Abs 10.8 (H) 1.7 - 7.7 K/uL   Lymphocytes Relative 9 %   Lymphs Abs 1.2 0.7 - 4.0 K/uL   Monocytes Relative 8 %   Monocytes Absolute 1.1 (H) 0.1 - 1.0 K/uL   Eosinophils Relative 4 %   Eosinophils Absolute 0.5  0.0 - 0.5 K/uL   Basophils Relative 1 %   Basophils Absolute 0.1 0.0 - 0.1 K/uL   Immature Granulocytes 1 %   Abs Immature Granulocytes 0.11 (H) 0.00 - 0.07 K/uL    Comment: Performed at [facility], [place] [address], [place], [place] [address]  Urinalysis, Complete w Microscopic     Status: Abnormal   Collection Time: 12/24/18  1:11 PM  Result Value Ref Range   Color, Urine YELLOW YELLOW   APPearance HAZY (A) CLEAR   Specific Gravity, Urine 1.011 1.005 - 1.030   pH 6.0 5.0 - 8.0   Glucose, UA NEGATIVE NEGATIVE mg/dL   Hgb urine dipstick SMALL (A) NEGATIVE   Bilirubin Urine NEGATIVE NEGATIVE   Ketones, ur NEGATIVE NEGATIVE mg/dL   Protein, ur 100 (A) NEGATIVE mg/dL   Nitrite NEGATIVE NEGATIVE   Leukocytes,Ua MODERATE (A) NEGATIVE   RBC / HPF 0-5 0 - 5 RBC/hpf   WBC, UA >50 (H) 0 - 5 WBC/hpf   Bacteria, UA RARE (A) NONE SEEN   Mucus PRESENT     Comment: Performed at [facility], [place] [address], [place], [place] [address]  Ammonia     Status: None   Collection Time: 12/24/18  1:11 PM  Result Value Ref Range   Ammonia 10 9 - 35 umol/L    Comment: Performed at [facility], [place] [address], [place], [place] [address]  Lactic acid, plasma     Status: None   Collection Time: 12/24/18  1:11 PM  Result Value Ref Range   Lactic Acid, Venous 0.9 0.5 - 1.9 mmol/L    Comment: Performed at [facility],  [place] [address], [place], [place] [address]  Influenza panel by PCR (type A & B)     Status: None   Collection Time: 12/24/18  2:41 PM  Result Value Ref Range   Influenza A By PCR NEGATIVE NEGATIVE   Influenza B By PCR NEGATIVE NEGATIVE    Comment: (NOTE) The Xpert Xpress Flu assay is intended as an aid in the diagnosis of  influenza and should not be used as a sole basis for treatment.  This  assay is FDA approved for nasopharyngeal swab specimens only. Nasal  washings and aspirates are unacceptable for Xpert  Xpress Flu testing. Performed at [facility], [place] [address] [place], [place] [address]    @[email or web address] @  Blood pressure (!) 168/73, pulse 70, temperature 98.1 F (36.7 C), temperature source Rectal, resp. rate 18, height 5' 2.99" (1.6 m), weight 41.6 kg, SpO2 98 %.    Assessment/Plan Problem  Encephalopathy, Toxic  Aspiration Pneumonia of Both Lower Lobes Due to Gastric Secretions (Hcc)  Dyspnea On Exertion  Ckd (Chronic Kidney Disease) Stage 4, Gfr 15-29 Ml/Min (Hcc)  Chronic Pain Syndrome  Failure to Thrive in Adult   The patient is being admitted to a medical bed. She will receive IV antibiotics to cover for HCAP. She will be continued on her home medications as possible. Nephrotoxic substances will be avoided.   I have seen and examined this patient myself. I have spent 75 minutes in her evaluation and care.  Maui Britten 12/24/2018, 6:25 PM

## 2018-12-24 NOTE — ED Triage Notes (Signed)
Pt coming from [facility] with c/o multiple falls and AMS. Pt reportedly has had 6 falls today and 2 falls last night. c-collar in place as a precaution.  Pt is alert to self only.  Staff was unable to give a baseline. Pt is reportedly normally ambulatory and has had decreased mobility in the past 48 hours.

## 2018-12-24 NOTE — Progress Notes (Signed)
Pharmacy Antibiotic Note  Alyssa [name] is a 78 y.o. female with hx CKD presented to  the ED on 12/24/2018 s/p fall and with c/o AMS. Of note, patient was on cipro PTA for UTI. CXR on 3/8 showed "increase in left base airspace disease, suspicious for infection or aspiration."  To start vancomycin and cefepime for PNA.  - afeb, wbc 13.9 - CKD, scr 1.87 (crcl~18)   Plan: - vancomycin 1000 mg IV x1 in the ED. With renal insuff, will f/u in renal function and check level in 1 to 2 days and redose if level is < 20 - cefepime 1gm IV q24h (dose adjusted for renal function) - f/u with renal function  _____________________________________________  Temp (24hrs), Avg:98.1 F (36.7 C), Min:98.1 F (36.7 C), Max:98.1 F (36.7 C)  Recent Labs  Lab 12/24/18 1311  WBC 13.9*  CREATININE 1.87*  LATICACIDVEN 0.9    Estimated Creatinine Clearance: 18.3 mL/min (A) (by C-G formula based on SCr of 1.87 mg/dL (H)).    No Known Allergies   Thank you for allowing pharmacy to be a part of this patient's care.  Lynelle Doctor 12/24/2018 5:07 PM

## 2018-12-25 DIAGNOSIS — J189 Pneumonia, unspecified organism: Secondary | ICD-10-CM

## 2018-12-25 DIAGNOSIS — J9601 Acute respiratory failure with hypoxia: Secondary | ICD-10-CM

## 2018-12-25 DIAGNOSIS — R9431 Abnormal electrocardiogram [ECG] [EKG]: Secondary | ICD-10-CM

## 2018-12-25 LAB — CBC WITH DIFFERENTIAL/PLATELET
Abs Immature Granulocytes: 0.05 10*3/uL (ref 0.00–0.07)
Basophils Absolute: 0.1 10*3/uL (ref 0.0–0.1)
Basophils Relative: 1 %
Eosinophils Absolute: 0.4 10*3/uL (ref 0.0–0.5)
Eosinophils Relative: 3 %
HCT: 28.9 % — ABNORMAL LOW (ref 36.0–46.0)
Hemoglobin: 9.2 g/dL — ABNORMAL LOW (ref 12.0–15.0)
Immature Granulocytes: 0 %
Lymphocytes Relative: 11 %
Lymphs Abs: 1.3 10*3/uL (ref 0.7–4.0)
MCH: 27.5 pg (ref 26.0–34.0)
MCHC: 31.8 g/dL (ref 30.0–36.0)
MCV: 86.3 fL (ref 80.0–100.0)
Monocytes Absolute: 0.9 10*3/uL (ref 0.1–1.0)
Monocytes Relative: 8 %
Neutro Abs: 9.3 10*3/uL — ABNORMAL HIGH (ref 1.7–7.7)
Neutrophils Relative %: 77 %
Platelets: 602 10*3/uL — ABNORMAL HIGH (ref 150–400)
RBC: 3.35 MIL/uL — ABNORMAL LOW (ref 3.87–5.11)
RDW: 14.8 % (ref 11.5–15.5)
WBC: 12 10*3/uL — ABNORMAL HIGH (ref 4.0–10.5)
nRBC: 0 % (ref 0.0–0.2)

## 2018-12-25 LAB — BASIC METABOLIC PANEL
Anion gap: 11 (ref 5–15)
BUN: 43 mg/dL — ABNORMAL HIGH (ref 8–23)
CO2: 19 mmol/L — ABNORMAL LOW (ref 22–32)
Calcium: 9.2 mg/dL (ref 8.9–10.3)
Chloride: 115 mmol/L — ABNORMAL HIGH (ref 98–111)
Creatinine, Ser: 1.59 mg/dL — ABNORMAL HIGH (ref 0.44–1.00)
GFR calc Af Amer: 36 mL/min — ABNORMAL LOW (ref >60)
GFR calc non Af Amer: 31 mL/min — ABNORMAL LOW (ref >60)
Glucose, Bld: 99 mg/dL (ref 70–99)
Potassium: 4.2 mmol/L (ref 3.5–5.1)
Sodium: 145 mmol/L (ref 135–145)

## 2018-12-25 LAB — RESPIRATORY PANEL BY PCR

## 2018-12-25 LAB — URINE CULTURE: Culture: NO GROWTH

## 2018-12-25 LAB — STREP PNEUMONIAE URINARY ANTIGEN: Strep Pneumo Urinary Antigen: NEGATIVE

## 2018-12-25 LAB — MAGNESIUM: Magnesium: 2 mg/dL (ref 1.7–2.4)

## 2018-12-25 LAB — MRSA PCR SCREENING: MRSA by PCR: NEGATIVE

## 2018-12-25 LAB — LITHIUM LEVEL: Lithium Lvl: 1.4 mmol/L — ABNORMAL HIGH (ref 0.60–1.20)

## 2018-12-25 MED ORDER — LORAZEPAM 2 MG/ML IJ SOLN
0.5000 mg | Freq: Once | INTRAMUSCULAR | Status: AC
  Administered 2018-12-25: 0.5 mg via INTRAVENOUS
  Filled 2018-12-25: qty 1

## 2018-12-25 MED ORDER — HYDRALAZINE HCL 20 MG/ML IJ SOLN: 10.0000 mg | Freq: Four times a day (QID) | INTRAMUSCULAR | Status: DC | PRN

## 2018-12-25 MED ORDER — METOPROLOL TARTRATE 50 MG PO TABS
50.0000 mg | ORAL_TABLET | Freq: Two times a day (BID) | ORAL | Status: DC
  Administered 2018-12-25 – 2018-12-29 (×6): 50 mg via ORAL
  Filled 2018-12-25 (×7): qty 1

## 2018-12-25 MED ORDER — HYDRALAZINE HCL 20 MG/ML IJ SOLN
10.0000 mg | Freq: Four times a day (QID) | INTRAMUSCULAR | Status: DC | PRN
  Administered 2018-12-25 – 2019-01-01 (×5): 10 mg via INTRAVENOUS
  Filled 2018-12-25 (×6): qty 1

## 2018-12-25 NOTE — Progress Notes (Addendum)
PROGRESS NOTE                                                                                                                                                                                                             Patient Demographics:    Alyssa [name], is a 78 y.o. female, DOB - 12/29/1940, JEH:[id number]  Admit date - 12/24/2018   Admitting Physician Karie Kirks, DO  Outpatient Primary MD for the patient is Everardo Beals, NP  LOS - 1  Outpatient Specialists: Dr. Einar Gip (cardiology)  Chief Complaint  Patient presents with  . Fall  . Altered Mental Status       Brief Narrative   78 year old female with history of stage IV chronic kidney disease, peripheral vascular disease status post femorofemoral bypass in 2013, chronic pain syndrome, hypertension, bipolar disorder, diabetes mellitus (new diagnosis) and ongoing tobacco use who was hospitalized recently (2 weeks back) with NSTEMI, seen by cardiology and recommended conservative management with aspirin, Plavix, statin and Imdur.  Patient was discharged to SNF on 2/26.  She was brought to the ED on 3/8 after frequent falls at the facility and increased confusion.  In the ED she was found to be agitated, confused and required a sitter for safety.  She was afebrile and vitals were stable except for mildly elevated systolic blood pressure.  Sats dropped down quickly to 78%.  Blood work showed elevated RBC of 13. 9K, elevated platelets of 602 with chest x-ray showing left basilar infiltrate and cardiomegaly.  CT of the head and cervical spine without acute findings.  EKG showed prolonged QTC. Patient admitted for acute toxic metabolic encephalopathy with acute hypoxic respiratory failure secondary to healthcare associated pneumonia and possible UTI.    Subjective:   Patient confused and nonverbal, frequently trying to get out of bed.  Safety sitter at bedside.   Assessment  & Plan :    Principal Problem: Acute respiratory failure with hypoxia (HCC) Possible healthcare associated pneumonia versus aspiration pneumonia. Was started empirically on cefepime.  Vancomycin not given yet and will check for MRSA PCR.  Still requiring 2 L via nasal cannula.  Wean as tolerated.  Follow-up blood culture, urine for strep and Legionella antigen.    Acute toxic metabolic encephalopathy Secondary to pneumonia +/-UTI.  will discontinue all benzos (Klonopin and Restoril) and avoid any narcotics.  We will also discontinue gabapentin and Celexa. Continue safety sitter at bedside as patient still restless and getting out of bed..  Ordered vest restrain.  Active Problems: Prolonged QTC 550 on presentation.  Is still 530 this morning.  Discontinue Celexa.  Will hold benzos and gabapentin as well.  Continue lithium for now and check levels.  Normal potassium and magnesium. monitor on telemetry.  Avoid any QT prolonging agents.  Recent NSTEMI Had a subendocardial MI.  Recommended conservative management by cardiology, continued aspirin and Plavix, Imdur and statin.    Failure to thrive in adult PT nutrition eval once mental status more clear.  Uncontrolled hypertension Blood pressure elevated.  Continue Imdur.  Metoprolol dose increased.  Add PRN hydralazine.    CKD (chronic kidney disease) stage 4, GFR 15-29 ml/min (HCC) Renal function at baseline.  Avoid any nephrotoxic agents.    Chronic pain syndrome Discontinue narcotics.  PRN Tylenol.  Bipolar disorder Lithium level supratherapeutic at 1.4.  During last hospitalization was subtherapeutic.  Lithium held and monitor levels the next 2 days.  Given her CKD stage IV I think it may be better to keep her off lithium.    Diabetes mellitus type 2, controlled Newly diagnosed with A1c of 6.6.  Not on meds.  Monitor for now.  Code Status : DNR (CODE STATUS was  discussed by her cardiologist with patient's son on 2/25  and made her DNR).  Family Communication  : None at bedside  Disposition Plan  : Pending hospital course, improvement in her pneumonia, pending culture and encephalopathy  Barriers For Discharge : Active symptoms  Consults  : None  Procedures  : CT head and cervical spine  DVT Prophylaxis  : Subcu heparin  Lab Results  Component Value Date   PLT 602 (H) 12/25/2018    Antibiotics  :    Anti-infectives (From admission, onward)   Start     Dose/Rate Route Frequency Ordered Stop   12/25/18 0000  ceFEPIme (MAXIPIME) 1 g in sodium chloride 0.9 % 100 mL IVPB  Status:  Discontinued     1 g 200 mL/hr over 30 Minutes Intravenous Every 8 hours 12/24/18 1641 12/24/18 1650   12/25/18 0000  ceFEPIme (MAXIPIME) 1 g in sodium chloride 0.9 % 100 mL IVPB     1 g 200 mL/hr over 30 Minutes Intravenous Every 24 hours 12/24/18 1650     12/24/18 1733  vancomycin variable dose per unstable renal function (pharmacist dosing)      Does not apply See admin instructions 12/24/18 1733     12/24/18 1545  vancomycin (VANCOCIN) IVPB 1000 mg/200 mL premix     1,000 mg 200 mL/hr over 60 Minutes Intravenous  Once 12/24/18 1543     12/24/18 1545  piperacillin-tazobactam (ZOSYN) IVPB 3.375 g     3.375 g 100 mL/hr over 30 Minutes Intravenous  Once 12/24/18 1543 12/24/18 1745        Objective:   Vitals:   12/24/18 1744 12/24/18 1809 12/24/18 2106 12/25/18 0532  BP: (!) 142/130 (!) 168/73 (!) 159/82 (!) 165/84  Pulse:  70 67 60  Resp:  18 15 14   Temp:    98.2 F (36.8 C)  TempSrc:    Axillary  SpO2:  98% 98% (!) 81%  Weight:  41.6 kg    Height:  5' 2.99" (1.6 m)      Wt Readings from Last 3 Encounters:  12/24/18 41.6 kg  12/13/18 44.6 kg  08/14/18 42.5 kg  Intake/Output Summary (Last 24 hours) at 12/25/2018 1019 Last data filed at 12/25/2018 0300 Gross per 24 hour  Intake 1284.42 ml  Output -  Net 1284.42 ml     Physical Exam  Gen: Restless, nonverbal HEENT: no pallor, moist mucosa,  supple neck Chest: clear b/l, no added sounds CVS: N S1&S2, no murmurs, rubs or gallop GI: soft, NT, ND, BS+ Musculoskeletal: warm, no edema CNS: AAOX 0, restless,    Data Review:    CBC Recent Labs  Lab 12/24/18 1311 12/25/18 0921  WBC 13.9* 12.0*  HGB 9.7* 9.2*  HCT 30.5* 28.9*  PLT 602* 602*  MCV 86.6 86.3  MCH 27.6 27.5  MCHC 31.8 31.8  RDW 15.0 14.8  LYMPHSABS 1.2 1.3  MONOABS 1.1* 0.9  EOSABS 0.5 0.4  BASOSABS 0.1 0.1    Chemistries  Recent Labs  Lab 12/24/18 1311 12/25/18 0921  NA 141 145  K 4.6 4.2  CL 110 115*  CO2 22 19*  GLUCOSE 104* 99  BUN 53* 43*  CREATININE 1.87* 1.59*  CALCIUM 9.5 9.2  AST 30  --   ALT 27  --   ALKPHOS 111  --   BILITOT 0.7  --    ------------------------------------------------------------------------------------------------------------------ No results for input(s): CHOL, HDL, LDLCALC, TRIG, CHOLHDL, LDLDIRECT in the last 72 hours.  Lab Results  Component Value Date   HGBA1C 6.6 (H) 12/10/2018   ------------------------------------------------------------------------------------------------------------------ No results for input(s): TSH, T4TOTAL, T3FREE, THYROIDAB in the last 72 hours.  Invalid input(s): FREET3 ------------------------------------------------------------------------------------------------------------------ No results for input(s): VITAMINB12, FOLATE, FERRITIN, TIBC, IRON, RETICCTPCT in the last 72 hours.  Coagulation profile No results for input(s): INR, PROTIME in the last 168 hours.  No results for input(s): DDIMER in the last 72 hours.  Cardiac Enzymes No results for input(s): CKMB, TROPONINI, MYOGLOBIN in the last 168 hours.  Invalid input(s): CK ------------------------------------------------------------------------------------------------------------------    Component Value Date/Time   BNP 471.1 (H) 12/11/2018 0424    Inpatient Medications  Scheduled Meds: . aspirin EC  81 mg  Oral Daily  . atorvastatin  80 mg Oral q1800  . clopidogrel  75 mg Oral Daily  . ferrous sulfate  325 mg Oral Q M,W,F  . heparin  5,000 Units Subcutaneous Q8H  . isosorbide mononitrate  60 mg Oral Daily  . lithium carbonate  300 mg Oral Daily  . metoprolol tartrate  25 mg Oral BID  . pantoprazole  40 mg Oral Daily  . vancomycin variable dose per unstable renal function (pharmacist dosing)   Does not apply See admin instructions   Continuous Infusions: . sodium chloride Stopped (12/25/18 0249)  . ceFEPime (MAXIPIME) IV 1 g (12/25/18 0200)  . vancomycin     PRN Meds:.  Micro Results Recent Results (from the past 240 hour(s))  Respiratory Panel by PCR     Status: None   Collection Time: 12/24/18  4:39 PM  Result Value Ref Range Status   Adenovirus NOT DETECTED NOT DETECTED Final   Coronavirus 229E NOT DETECTED NOT DETECTED Final    Comment: (NOTE) The Coronavirus on the Respiratory Panel, DOES NOT test for the novel  Coronavirus (2019 nCoV)    Coronavirus HKU1 NOT DETECTED NOT DETECTED Final   Coronavirus NL63 NOT DETECTED NOT DETECTED Final   Coronavirus OC43 NOT DETECTED NOT DETECTED Final   Metapneumovirus NOT DETECTED NOT DETECTED Final   Rhinovirus / Enterovirus NOT DETECTED NOT DETECTED Final   Influenza A NOT DETECTED NOT DETECTED Final   Influenza B NOT DETECTED  NOT DETECTED Final   Parainfluenza Virus 1 NOT DETECTED NOT DETECTED Final   Parainfluenza Virus 2 NOT DETECTED NOT DETECTED Final   Parainfluenza Virus 3 NOT DETECTED NOT DETECTED Final   Parainfluenza Virus 4 NOT DETECTED NOT DETECTED Final   Respiratory Syncytial Virus NOT DETECTED NOT DETECTED Final   Bordetella pertussis NOT DETECTED NOT DETECTED Final   Chlamydophila pneumoniae NOT DETECTED NOT DETECTED Final   Mycoplasma pneumoniae NOT DETECTED NOT DETECTED Final    Comment: Performed at [facility], [place] [address], [place], [place] [address]  Culture, blood (routine x 2) Call MD if unable to  obtain prior to antibiotics being given     Status: None (Preliminary result)   Collection Time: 12/24/18  7:06 PM  Result Value Ref Range Status   Specimen Description   Final    BLOOD RIGHT ANTECUBITAL Performed at [facility] [address], [place], [place] [address]    Special Requests   Final    BOTTLES DRAWN AEROBIC ONLY Blood Culture results may not be optimal due to an inadequate volume of blood received in culture bottles Performed at [facility] [address], [place], [place] [address]    Culture   Final    NO GROWTH < 12 HOURS Performed at [facility] [address], [place], [place] [address]    Report Status PENDING  Incomplete  Culture, blood (routine x 2) Call MD if unable to obtain prior to antibiotics being given     Status: None (Preliminary result)   Collection Time: 12/24/18  7:06 PM  Result Value Ref Range Status   Specimen Description   Final    BLOOD BLOOD RIGHT HAND Performed at [facility] [address], [place], [place] [address]    Special Requests   Final    BOTTLES DRAWN AEROBIC ONLY Blood Culture results may not be optimal due to an inadequate volume of blood received in culture bottles Performed at [facility] [address], [place], [place] [address]    Culture   Final    NO GROWTH < 12 HOURS Performed at [facility] [address] [place], [place] [address]    Report Status PENDING  Incomplete    Radiology Reports Dg Chest 2 View  Result Date: 12/09/2018 CLINICAL DATA:  Chest pain. EXAM: CHEST - 2 VIEW COMPARISON:  Chest x-ray dated September 18, 2017. FINDINGS: The heart size and mediastinal contours are within normal limits. Normal pulmonary vascularity. Lungs remain hyperinflated. No focal consolidation, pleural effusion, or pneumothorax. No acute osseous abnormality. IMPRESSION: No active cardiopulmonary disease. Electronically Signed   By:  Titus Dubin M.D.   On: 12/09/2018 14:08   Dg Abd 1 View  Result Date: 12/10/2018 CLINICAL DATA:  Emesis. EXAM: ABDOMEN - 1 VIEW COMPARISON:  None. FINDINGS: Supine abdomen shows no gaseous bowel dilatation to suggest overt obstruction. Air is seen scattered along segments of the colon. Status post lumbar fusion. Right hip replacement noted. Coarse calcification over the central pelvis likely related to fibroid disease. IMPRESSION: Nonspecific bowel gas pattern. Electronically Signed   By: Misty Stanley M.D.   On: 12/10/2018 18:04   Ct Head Wo Contrast  Result Date: 12/24/2018 CLINICAL DATA:  Multiple falls and altered mental status EXAM: CT HEAD WITHOUT CONTRAST CT CERVICAL SPINE WITHOUT CONTRAST TECHNIQUE: Multidetector CT imaging of the head and cervical spine was performed following the standard protocol without intravenous  contrast. Multiplanar CT image reconstructions of the cervical spine were also generated. COMPARISON:  06/23/2017 FINDINGS: CT HEAD FINDINGS Brain: No evidence of acute infarction, hemorrhage, hydrocephalus, extra-axial collection or mass lesion/mass effect. Mild borderline moderate patchy low-density in the cerebral white matter attributed to chronic small vessel ischemia given vascular risk factors and stability. Mild for age cerebral volume loss Vascular: Atherosclerotic calcification Skull: Negative Sinuses/Orbits: Postoperative right ethmoid sinuses. Bilateral cataract resection. No acute finding CT CERVICAL SPINE FINDINGS Alignment: Degenerative anterolisthesis at C4-5 and C7-T1 measuring up to 4 mm. Skull base and vertebrae: No evidence of acute fracture. Marked motion degradation at the level of T2 and T3 Soft tissues and spinal canal: No gross canal hematoma or prevertebral edema. Atherosclerotic calcification. Disc levels: Generalized disc degeneration particularly severe at C7-T1 and T1-2 where there is disc collapse and sclerosis. Diffuse facet arthropathy most advanced  at C7-T1 where there is joint distortion and anterolisthesis. Ankylosis has occurred at C6-7. Upper chest: Emphysema with motion. IMPRESSION: No evidence of acute intracranial or cervical spine injury. Electronically Signed   By: Monte Fantasia M.D.   On: 12/24/2018 15:29   Ct Cervical Spine Wo Contrast  Result Date: 12/24/2018 CLINICAL DATA:  Multiple falls and altered mental status EXAM: CT HEAD WITHOUT CONTRAST CT CERVICAL SPINE WITHOUT CONTRAST TECHNIQUE: Multidetector CT imaging of the head and cervical spine was performed following the standard protocol without intravenous contrast. Multiplanar CT image reconstructions of the cervical spine were also generated. COMPARISON:  06/23/2017 FINDINGS: CT HEAD FINDINGS Brain: No evidence of acute infarction, hemorrhage, hydrocephalus, extra-axial collection or mass lesion/mass effect. Mild borderline moderate patchy low-density in the cerebral white matter attributed to chronic small vessel ischemia given vascular risk factors and stability. Mild for age cerebral volume loss Vascular: Atherosclerotic calcification Skull: Negative Sinuses/Orbits: Postoperative right ethmoid sinuses. Bilateral cataract resection. No acute finding CT CERVICAL SPINE FINDINGS Alignment: Degenerative anterolisthesis at C4-5 and C7-T1 measuring up to 4 mm. Skull base and vertebrae: No evidence of acute fracture. Marked motion degradation at the level of T2 and T3 Soft tissues and spinal canal: No gross canal hematoma or prevertebral edema. Atherosclerotic calcification. Disc levels: Generalized disc degeneration particularly severe at C7-T1 and T1-2 where there is disc collapse and sclerosis. Diffuse facet arthropathy most advanced at C7-T1 where there is joint distortion and anterolisthesis. Ankylosis has occurred at C6-7. Upper chest: Emphysema with motion. IMPRESSION: No evidence of acute intracranial or cervical spine injury. Electronically Signed   By: Monte Fantasia M.D.   On:  12/24/2018 15:29   Nm Pulmonary Vent And Perf (v/q Scan)  Result Date: 12/10/2018 CLINICAL DATA:  Left substernal chest pain and shortness of breath. EXAM: NUCLEAR MEDICINE VENTILATION - PERFUSION LUNG SCAN TECHNIQUE: Ventilation images were obtained in multiple projections using inhaled aerosol Tc-54m DTPA. Perfusion images were obtained in multiple projections after intravenous injection of Tc-9m MAA. RADIOPHARMACEUTICALS:  31.8 mCi of Tc-82m DTPA aerosol inhalation and 4.37 mCi Tc53m MAA IV COMPARISON:  Chest radiograph 12/09/2018 FINDINGS: Ventilation: Patchy radiotracer distribution throughout the lung parenchyma with scattered ventilation defects. Perfusion: There is a wedge shaped peripheral perfusion defects in the left mid lung, however it is matched with a ventilation defect with a similar distribution. Therefore, there is a low probability of this representing a pulmonary embolus, unless it is associated with atelectatic lung parenchyma. IMPRESSION: Wedge shaped peripheral perfusion defects in the left mid lung, which is matched with a ventilation defect with a similar distribution. Therefore, there is a low probability of  this representing a pulmonary embolus, unless it is associated with atelectatic lung parenchyma. Patchy radiotracer distribution throughout the lung parenchyma, suggestive of COPD. Electronically Signed   By: Fidela Salisbury M.D.   On: 12/10/2018 09:42   Dg Chest Portable 1 View  Result Date: 12/24/2018 CLINICAL DATA:  Multiple falls and altered mental status. EXAM: PORTABLE CHEST 1 VIEW COMPARISON:  12/11/2018 FINDINGS: Midline trachea. Cardiomegaly accentuated by AP portable technique. No pleural effusion or pneumothorax. Increase in left base airspace disease. High-riding humeral heads. IMPRESSION: 1. Increase in left base airspace disease, suspicious for infection or aspiration. 2. Cardiomegaly without congestive failure. Electronically Signed   By: Abigail Miyamoto M.D.    On: 12/24/2018 14:34   Dg Chest Port 1 View  Result Date: 12/11/2018 CLINICAL DATA:  Increased shortness of breath EXAM: PORTABLE CHEST 1 VIEW COMPARISON:  12/10/2018 FINDINGS: There is hazy left lower lobe airspace disease concerning for atelectasis versus pneumonia. There is no pleural effusion or pneumothorax. The heart and mediastinal contours are unremarkable. 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 IMPRESSION: Hazy left lower lobe airspace disease concerning for atelectasis versus pneumonia. Electronically Signed   By: Kathreen Devoid   On: 12/11/2018 11:21   Dg Chest Portable 1 View  Result Date: 12/10/2018 CLINICAL DATA:  Hypoxia EXAM: PORTABLE CHEST 1 VIEW COMPARISON:  Chest radiograph from one day prior. FINDINGS: Stable cardiomediastinal silhouette with normal heart size. No pneumothorax. No pleural effusion. No pulmonary edema. Mild hazy left lung base opacity. IMPRESSION: Mild hazy left lung base opacity, which could represent atelectasis, aspiration or pneumonia. Recommend short-term follow-up PA and lateral chest radiographs. Electronically Signed   By: Ilona Sorrel M.D.   On: 12/10/2018 18:04   Vas [place] Lower Extremity Venous (dvt)  Result Date: 12/12/2018  Lower Venous Study Indications: Pain.  Risk Factors: PAD. Performing Technologist: Toma Copier RVS  Examination Guidelines: A complete evaluation includes B-mode imaging, spectral Doppler, color Doppler, and power Doppler as needed of all accessible portions of each vessel. Bilateral testing is considered an integral part of a complete examination. Limited examinations for reoccurring indications may be performed as noted.  Right Venous Findings: +---------+---------------+---------+-----------+----------+-------+          CompressibilityPhasicitySpontaneityPropertiesSummary +---------+---------------+---------+-----------+----------+-------+ CFV      Full           Yes      Yes                           +---------+---------------+---------+-----------+----------+-------+ SFJ      Full                                                 +---------+---------------+---------+-----------+----------+-------+ FV Prox  Full           Yes      Yes                          +---------+---------------+---------+-----------+----------+-------+ FV Mid   Full                                                 +---------+---------------+---------+-----------+----------+-------+ FV DistalFull  Yes      Yes                          +---------+---------------+---------+-----------+----------+-------+ PFV      Full           Yes      Yes                          +---------+---------------+---------+-----------+----------+-------+ POP      Full           Yes      Yes                          +---------+---------------+---------+-----------+----------+-------+ PTV      Full                                                 +---------+---------------+---------+-----------+----------+-------+ PERO     Full                                                 +---------+---------------+---------+-----------+----------+-------+  Left Venous Findings: +---------+---------------+---------+-----------+----------+-------------------+          CompressibilityPhasicitySpontaneityPropertiesSummary             +---------+---------------+---------+-----------+----------+-------------------+ CFV      Full                                                             +---------+---------------+---------+-----------+----------+-------------------+ SFJ      Full                                                             +---------+---------------+---------+-----------+----------+-------------------+ FV Prox  Full                                                             +---------+---------------+---------+-----------+----------+-------------------+ FV Mid   Full                                                              +---------+---------------+---------+-----------+----------+-------------------+ FV DistalFull                                                             +---------+---------------+---------+-----------+----------+-------------------+  PFV      Full                                                             +---------+---------------+---------+-----------+----------+-------------------+ POP      Full                                                             +---------+---------------+---------+-----------+----------+-------------------+ PTV      Full                                                             +---------+---------------+---------+-----------+----------+-------------------+ PERO                                                  Unable to visualize                                                       well enough to                                                            fully evaluate due                                                        to size of the vein +---------+---------------+---------+-----------+----------+-------------------+    Summary: Right: There is no evidence of deep vein thrombosis in the lower extremity. No cystic structure found in the popliteal fossa. Left: There is no evidence of deep vein thrombosis in the lower extremity. No cystic structure found in the popliteal fossa.  *See table(s) above for measurements and observations. Electronically signed by Harold Barban MD on 12/12/2018 at 7:07:04 PM.    Final     Time Spent in minutes  35   Jeyson Deshotel M.D on 12/25/2018 at 10:19 AM  Between 7am to 7pm - Pager - [phone]  After 7pm go to [email or web address] - password [email or web address]  [facility] -  Office  [phone]

## 2018-12-26 ENCOUNTER — Encounter (HOSPITAL_COMMUNITY): Payer: Self-pay | Admitting: *Deleted

## 2018-12-26 ENCOUNTER — Other Ambulatory Visit: Payer: Self-pay

## 2018-12-26 DIAGNOSIS — E1165 Type 2 diabetes mellitus with hyperglycemia: Secondary | ICD-10-CM

## 2018-12-26 DIAGNOSIS — I252 Old myocardial infarction: Secondary | ICD-10-CM

## 2018-12-26 DIAGNOSIS — E1122 Type 2 diabetes mellitus with diabetic chronic kidney disease: Secondary | ICD-10-CM

## 2018-12-26 DIAGNOSIS — I129 Hypertensive chronic kidney disease with stage 1 through stage 4 chronic kidney disease, or unspecified chronic kidney disease: Secondary | ICD-10-CM

## 2018-12-26 DIAGNOSIS — I4581 Long QT syndrome: Secondary | ICD-10-CM

## 2018-12-26 LAB — BASIC METABOLIC PANEL
Anion gap: 12 (ref 5–15)
BUN: 34 mg/dL — ABNORMAL HIGH (ref 8–23)
CO2: 15 mmol/L — ABNORMAL LOW (ref 22–32)
Calcium: 9.5 mg/dL (ref 8.9–10.3)
Chloride: 119 mmol/L — ABNORMAL HIGH (ref 98–111)
Creatinine, Ser: 1.44 mg/dL — ABNORMAL HIGH (ref 0.44–1.00)
GFR calc Af Amer: 41 mL/min — ABNORMAL LOW (ref >60)
GFR calc non Af Amer: 35 mL/min — ABNORMAL LOW (ref >60)
Glucose, Bld: 100 mg/dL — ABNORMAL HIGH (ref 70–99)
Potassium: 5 mmol/L (ref 3.5–5.1)
Sodium: 146 mmol/L — ABNORMAL HIGH (ref 135–145)

## 2018-12-26 LAB — MAGNESIUM: Magnesium: 1.9 mg/dL (ref 1.7–2.4)

## 2018-12-26 LAB — HIV ANTIBODY (ROUTINE TESTING W REFLEX): HIV Screen 4th Generation wRfx: NONREACTIVE

## 2018-12-26 LAB — VITAMIN B12: Vitamin B-12: 620 pg/mL (ref 180–914)

## 2018-12-26 MED ORDER — SODIUM BICARBONATE 8.4 % IV SOLN: INTRAVENOUS | Status: DC

## 2018-12-26 MED ORDER — DEXTROSE 5 % IV SOLN: INTRAVENOUS | Status: DC

## 2018-12-26 MED ORDER — SODIUM BICARBONATE 8.4 % IV SOLN
INTRAVENOUS | Status: DC
  Administered 2018-12-26 – 2019-01-04 (×9): via INTRAVENOUS
  Filled 2018-12-26 (×21): qty 1000

## 2018-12-26 NOTE — TOC Initial Note (Signed)
Transition of Care [facility]) - Initial/Assessment Note    Patient Details  Name: Alyssa [name] MRN: [id number] Date of Birth: 02-Jan-1941  Transition of Care [facility]) CM/SW Contact:    Lynnell Catalan, RN Phone Number: 12/26/2018, 2:44 PM  Clinical Narrative:                 This CM contacted [name] for dc planning. [name] states he does not want pt to go back to [facility], that he does not like it there. Will need new PT eval to fax out pt information for new bed offers. PT eval order received. Will await recommendations and fax out pt info.  Expected Discharge Plan: Skilled Nursing Facility Barriers to Discharge: No Barriers Identified   Patient Goals and CMS Choice Patient states their goals for this hospitalization and ongoing recovery are:: Unable to state. Confused      Expected Discharge Plan and Services Expected Discharge Plan: [place] arrangements for the past 2 months: Skilled Nursing Facility(since 2/26 at [facility])                          Prior Living Arrangements/Services Living arrangements for the past 2 months: Skilled Nursing Facility(since 2/26 at [facility]) Lives with:: Facility Resident Patient language and need for interpreter reviewed:: Yes Do you feel safe going back to the place where you live?: No(Son wants different facility. Doesn't like [facility])   Son wants different facility. Doesn't like [place]  Need for Family Participation in Patient Care: Yes (Comment) Care giver support system in place?: Yes (comment)   Criminal Activity/Legal Involvement Pertinent to Current Situation/Hospitalization: No - Comment as needed  Activities of Daily Living Home Assistive Devices/Equipment: Walker (specify type), Cane (specify quad or straight) ADL Screening (condition at time of admission) Patient's cognitive ability adequate to safely complete daily activities?: No Is the patient deaf or have difficulty hearing?: Yes Does the patient have  difficulty seeing, even when wearing glasses/contacts?: No Does the patient have difficulty concentrating, remembering, or making decisions?: No Patient able to express need for assistance with ADLs?: Yes Does the patient have difficulty dressing or bathing?: No Independently performs ADLs?: Yes (appropriate for developmental age) Does the patient have difficulty walking or climbing stairs?: Yes Weakness of Legs: Both Weakness of Arms/Hands: None  Permission Sought/Granted Permission sought to share information with : Other (comment)(No need for permission at this time)                Emotional Assessment Appearance:: Appears stated age Attitude/Demeanor/Rapport: Uncooperative Affect (typically observed): Restless Orientation: : Oriented to Self Alcohol / Substance Use: Not Applicable Psych Involvement: No (comment)  Admission diagnosis:  Lower urinary tract infectious disease [N39.0] Fall, initial encounter [W19.XXXA] Altered mental status, unspecified altered mental status type [R41.82] Aspiration pneumonia of right lower lobe, unspecified aspiration pneumonia type [facility]) [J69.0] Patient Active Problem List   Diagnosis Date Noted  . Encephalopathy, toxic 12/24/2018  . Aspiration pneumonia of both lower lobes due to gastric secretions ([place]) 12/24/2018  . Non-ST elevation (NSTEMI) myocardial infarction ([place])   . Chest pain   . Dyspnea on exertion   . Elevated troponin level 12/09/2018  . Chronic migraine 03/27/2018  . Lumbar radiculopathy 03/27/2018  . Gait abnormality 03/27/2018  . CKD (chronic kidney disease) stage 4, GFR 15-29 ml/min (HCC) 12/28/2016  . Normocytic anemia 12/28/2016  . Chronic pain syndrome 12/28/2016  . Benzodiazepine withdrawal ([place]) 12/28/2016  .  Benzodiazepine withdrawal without complication ([place]) 58/52/7782  . Chronic right shoulder pain 12/06/2016  . Fall 09/17/2016  . Fracture of humeral head, closed, right, initial encounter 09/17/2016  . Rib  fractures 09/17/2016  . Multiple falls 09/17/2016  . Severe muscle deconditioning 09/17/2016  . Failure to thrive in adult 09/17/2016  . Melena 04/25/2016  . Essential hypertension 04/25/2016  . Anxiety state 04/25/2016  . Tobacco abuse 04/25/2016  . Hyperlipidemia 04/25/2016  . Syncope 04/24/2016  . Syncope and collapse 04/24/2016  . Gait difficulty 01/12/2016  . Foot drop, left 01/12/2016  . Severe recurrent major depression without psychotic features ([place]) 08/28/2015  . Spondylolisthesis at L4-L5 level 07/30/2015  . PVD (peripheral vascular disease) with claudication ([place]) 07/29/2014  . Weakness-Bilateral arm/leg 07/29/2014  . Numbness-left leg 07/29/2014  . Swelling of limb-Legs 07/29/2014  . Atherosclerosis of native arteries of the extremities with intermittent claudication 09/30/2011   PCP:  Everardo Beals, NP Pharmacy:   [facility] [address], [address] [place] [address] Phone: [phone] Fax: [phone]     Social Determinants of Health (SDOH) Interventions    Readmission Risk Interventions 30 Day Unplanned Readmission Risk Score     ED to Hosp-Admission (Current) from 12/24/2018 in [place] [facility]  30 Day Unplanned Readmission Risk Score (%)  30 Filed at 12/26/2018 1200     This score is the patient's risk of an unplanned readmission within 30 days of being discharged (0 -100%). The score is based on dignosis, age, lab data, medications, orders, and past utilization.   Low:  0-14.9   Medium: 15-21.9   High: 22-29.9   Extreme: 30 and above       Readmission Risk Prevention Plan 12/26/2018  Transportation Screening Complete  Medication Review [employer]) Complete  PCP or Specialist appointment within 3-5 days of discharge Not Complete  PCP/Specialist Appt Not Complete comments pt for snf. Snf MD to see as needed.  [place]  or Home Care Consult Not Complete  HRI or Home Care Consult Pt Refusal Comments NA  SW Recovery Care/Counseling Consult Not Complete  SW Consult Not Complete Comments NA  Palliative Care Screening Complete  Skilled Nursing Facility Complete  Some recent data might be hidden

## 2018-12-26 NOTE — Progress Notes (Addendum)
PROGRESS NOTE                                                                                                                                                                                                             Patient Demographics:    Alyssa [name], is a 78 y.o. female, DOB - 07-20-1941, VZC:[id number]  Admit date - 12/24/2018   Admitting Physician Karie Kirks, DO  Outpatient Primary MD for the patient is Everardo Beals, NP  LOS - 2  Outpatient Specialists: Dr. Einar Gip (cardiology)  Chief Complaint  Patient presents with  . Fall  . Altered Mental Status       Brief Narrative   78 year old female with history of stage IV chronic kidney disease, peripheral vascular disease status post femorofemoral bypass in 2013, chronic pain syndrome, hypertension, bipolar disorder, diabetes mellitus (new diagnosis) and ongoing tobacco use who was hospitalized recently (2 weeks back) with NSTEMI, seen by cardiology and recommended conservative management with aspirin, Plavix, statin and Imdur.  Patient was discharged to SNF on 2/26.  She was brought to the ED on 3/8 after frequent falls at the facility and increased confusion.  In the ED she was found to be agitated, confused and required a sitter for safety.  She was afebrile and vitals were stable except for mildly elevated systolic blood pressure.  Sats dropped down quickly to 78%.  Blood work showed elevated RBC of 13. 9K, elevated platelets of 602 with chest x-ray showing left basilar infiltrate and cardiomegaly.  CT of the head and cervical spine without acute findings.  EKG showed prolonged QTC. Patient admitted for acute toxic metabolic encephalopathy with acute hypoxic respiratory failure secondary to healthcare associated pneumonia and possible UTI.    Subjective:   Still confused and requiring safety sitter along with restraint.  Received a low-dose Ativan last  evening for?  Agitation.  QTC still prolonged   Assessment  & Plan :    Principal Problem: Acute respiratory failure with hypoxia (HCC) Possible healthcare associated pneumonia versus aspiration pneumonia. Continue empiric cefepime.  MRSA PCR negative.  Wean oxygen.  Blood cultures negative.  Follow-up blood culture, urine for strep antigen negative.  Pending Legionella antigen.    Acute toxic metabolic encephalopathy Secondary to pneumonia +/-UTI.  Encephalopathy has not improved since admission.  Recent TSH was normal.  Head CT  unremarkable.  Blood and urine culture negative for growth. Discontinued all benzos (Klonopin and Restoril) and avoid any narcotics.  We also discontinued gabapentin and Celexa. Continue safety sitter at bedside as patient still restless and getting out of bed.. Continue four-point and vest restraint.  Active Problems: Prolonged QTC 550 on presentation.  Still prolonged to 540s on EKG.  Discontinued Celexa and holding benzos along with gabapentin.  Supratherapeutic lithium level and being held.  Potassium normal.  Magnesium is 2.  Avoid any QT prolonging agents and monitor closely on telemetry.  Recent NSTEMI Had a subendocardial MI.  Recommended conservative management by cardiology, continued aspirin and Plavix, Imdur and statin.    Failure to thrive in adult PT nutrition eval once mental status more clear.  Uncontrolled hypertension Blood pressure still elevated.  Imdur continued and metoprolol dose increased continue PRN hydralazine.      CKD (chronic kidney disease) stage 4, GFR 15-29 ml/min (HCC) Renal function at baseline.  Avoid any nephrotoxic agents.  Metabolic acidosis and hyponatremia Switch normal saline to D5 with 1 amp of bicarb.  Monitor labs in a.m.     Chronic pain syndrome Discontinue narcotics.  PRN Tylenol.  Bipolar disorder Lithium level supratherapeutic at 1.4.  During last hospitalization was subtherapeutic.  Lithium held and  monitor levels in a.m.  Given her CKD stage IV I think it may be better to keep her off lithium.    Diabetes mellitus type 2, controlled Newly diagnosed with A1c of 6.6.  Not on meds.  Monitor for now.  Code Status : DNR (CODE STATUS was  discussed by her cardiologist with patient's son on 2/25 and made her DNR).  Family Communication  : None at bedside  Disposition Plan  : Pending hospital course, improvement in her pneumonia and encephalopathy.  Barriers For Discharge : Active symptoms  Consults  : None  Procedures  : CT head and cervical spine  DVT Prophylaxis  : Subcu heparin  Lab Results  Component Value Date   PLT 602 (H) 12/25/2018    Antibiotics  :    Anti-infectives (From admission, onward)   Start     Dose/Rate Route Frequency Ordered Stop   12/25/18 0000  ceFEPIme (MAXIPIME) 1 g in sodium chloride 0.9 % 100 mL IVPB  Status:  Discontinued     1 g 200 mL/hr over 30 Minutes Intravenous Every 8 hours 12/24/18 1641 12/24/18 1650   12/25/18 0000  ceFEPIme (MAXIPIME) 1 g in sodium chloride 0.9 % 100 mL IVPB     1 g 200 mL/hr over 30 Minutes Intravenous Every 24 hours 12/24/18 1650     12/24/18 1733  vancomycin variable dose per unstable renal function (pharmacist dosing)  Status:  Discontinued      Does not apply See admin instructions 12/24/18 1733 12/25/18 1140   12/24/18 1545  vancomycin (VANCOCIN) IVPB 1000 mg/200 mL premix  Status:  Discontinued     1,000 mg 200 mL/hr over 60 Minutes Intravenous  Once 12/24/18 1543 12/25/18 1140   12/24/18 1545  piperacillin-tazobactam (ZOSYN) IVPB 3.375 g     3.375 g 100 mL/hr over 30 Minutes Intravenous  Once 12/24/18 1543 12/24/18 1745        Objective:   Vitals:   12/25/18 2129 12/26/18 0027 12/26/18 0512 12/26/18 0701  BP: (!) 167/93 126/83 (!) 188/90 105/81  Pulse: 81  71   Resp: 20     Temp: 97.7 F (36.5 C)  98 F (36.7 C)  TempSrc: Oral  Axillary   SpO2: 100%  100%   Weight:      Height:        Wt  Readings from Last 3 Encounters:  12/24/18 41.6 kg  12/13/18 44.6 kg  08/14/18 42.5 kg     Intake/Output Summary (Last 24 hours) at 12/26/2018 1053 Last data filed at 12/26/2018 0929 Gross per 24 hour  Intake 1721.66 ml  Output -  Net 1721.66 ml    Physical exam General: Somnolent during initial exam, subsequently was trying to get out of bed HEENT: Moist mucosa, supple neck Chest: Clear to auscultation bilaterally CVS: Normal S1-S2, no murmurs GI: Soft, nondistended, nontender, Musculoskeletal: Warm, no edema CNS: Alert and oriented x0 restless and in restraints     Data Review:    CBC Recent Labs  Lab 12/24/18 1311 12/25/18 0921  WBC 13.9* 12.0*  HGB 9.7* 9.2*  HCT 30.5* 28.9*  PLT 602* 602*  MCV 86.6 86.3  MCH 27.6 27.5  MCHC 31.8 31.8  RDW 15.0 14.8  LYMPHSABS 1.2 1.3  MONOABS 1.1* 0.9  EOSABS 0.5 0.4  BASOSABS 0.1 0.1    Chemistries  Recent Labs  Lab 12/24/18 1311 12/25/18 0921 12/26/18 0546  NA 141 145 146*  K 4.6 4.2 5.0  CL 110 115* 119*  CO2 22 19* 15*  GLUCOSE 104* 99 100*  BUN 53* 43* 34*  CREATININE 1.87* 1.59* 1.44*  CALCIUM 9.5 9.2 9.5  MG  --  2.0 1.9  AST 30  --   --   ALT 27  --   --   ALKPHOS 111  --   --   BILITOT 0.7  --   --    ------------------------------------------------------------------------------------------------------------------ No results for input(s): CHOL, HDL, LDLCALC, TRIG, CHOLHDL, LDLDIRECT in the last 72 hours.  Lab Results  Component Value Date   HGBA1C 6.6 (H) 12/10/2018   ------------------------------------------------------------------------------------------------------------------ No results for input(s): TSH, T4TOTAL, T3FREE, THYROIDAB in the last 72 hours.  Invalid input(s): FREET3 ------------------------------------------------------------------------------------------------------------------ No results for input(s): VITAMINB12, FOLATE, FERRITIN, TIBC, IRON, RETICCTPCT in the last 72  hours.  Coagulation profile No results for input(s): INR, PROTIME in the last 168 hours.  No results for input(s): DDIMER in the last 72 hours.  Cardiac Enzymes No results for input(s): CKMB, TROPONINI, MYOGLOBIN in the last 168 hours.  Invalid input(s): CK ------------------------------------------------------------------------------------------------------------------    Component Value Date/Time   BNP 471.1 (H) 12/11/2018 0424    Inpatient Medications  Scheduled Meds: . aspirin EC  81 mg Oral Daily  . atorvastatin  80 mg Oral q1800  . clopidogrel  75 mg Oral Daily  . ferrous sulfate  325 mg Oral Q M,W,F  . heparin  5,000 Units Subcutaneous Q8H  . isosorbide mononitrate  60 mg Oral Daily  . metoprolol tartrate  50 mg Oral BID  . pantoprazole  40 mg Oral Daily   Continuous Infusions: . ceFEPime (MAXIPIME) IV Stopped (12/25/18 2333)  . dextrose 5 % 1,000 mL with sodium bicarbonate 50 mEq infusion 75 mL/hr at 12/26/18 0825   PRN Meds:.  Micro Results Recent Results (from the past 240 hour(s))  Urine culture     Status: None   Collection Time: 12/24/18  1:11 PM  Result Value Ref Range Status   Specimen Description   Final    URINE, RANDOM Performed at [facility] [address] [place], [place] [address]    Special Requests   Final    NONE Performed at [facility]  [place] [address] [place], [place] [address]    Culture   Final    NO GROWTH Performed at [facility], [place] [address] [place], [place] [address]    Report Status 12/25/2018 FINAL  Final  Respiratory Panel by PCR     Status: None   Collection Time: 12/24/18  4:39 PM  Result Value Ref Range Status   Adenovirus NOT DETECTED NOT DETECTED Final   Coronavirus 229E NOT DETECTED NOT DETECTED Final    Comment: (NOTE) The Coronavirus on the Respiratory Panel, DOES NOT test for the novel  Coronavirus (2019 nCoV)    Coronavirus HKU1 NOT DETECTED NOT DETECTED  Final   Coronavirus NL63 NOT DETECTED NOT DETECTED Final   Coronavirus OC43 NOT DETECTED NOT DETECTED Final   Metapneumovirus NOT DETECTED NOT DETECTED Final   Rhinovirus / Enterovirus NOT DETECTED NOT DETECTED Final   Influenza A NOT DETECTED NOT DETECTED Final   Influenza B NOT DETECTED NOT DETECTED Final   Parainfluenza Virus 1 NOT DETECTED NOT DETECTED Final   Parainfluenza Virus 2 NOT DETECTED NOT DETECTED Final   Parainfluenza Virus 3 NOT DETECTED NOT DETECTED Final   Parainfluenza Virus 4 NOT DETECTED NOT DETECTED Final   Respiratory Syncytial Virus NOT DETECTED NOT DETECTED Final   Bordetella pertussis NOT DETECTED NOT DETECTED Final   Chlamydophila pneumoniae NOT DETECTED NOT DETECTED Final   Mycoplasma pneumoniae NOT DETECTED NOT DETECTED Final    Comment: Performed at [facility], [address], [place], [place] [address]  Culture, blood (routine x 2) Call MD if unable to obtain prior to antibiotics being given     Status: None (Preliminary result)   Collection Time: 12/24/18  7:06 PM  Result Value Ref Range Status   Specimen Description   Final    BLOOD RIGHT ANTECUBITAL Performed at [facility] [address] [place], [place] [address]    Special Requests   Final    BOTTLES DRAWN AEROBIC ONLY Blood Culture results may not be optimal due to an inadequate volume of blood received in culture bottles Performed at [facility] [address], [place], [place] [address]    Culture   Final    NO GROWTH 2 DAYS Performed at [facility] [address], [place], [place] [address]    Report Status PENDING  Incomplete  Culture, blood (routine x 2) Call MD if unable to obtain prior to antibiotics being given     Status: None (Preliminary result)   Collection Time: 12/24/18  7:06 PM  Result Value Ref Range Status   Specimen Description   Final    BLOOD RIGHT HAND Performed at [facility], [place] [address]  [place], [place] [address]    Special Requests   Final    BOTTLES DRAWN AEROBIC ONLY Blood Culture results may not be optimal due to an inadequate volume of blood received in culture bottles Performed at [facility] [address], [place], [place] [address]    Culture   Final    NO GROWTH 2 DAYS Performed at [facility] [address], [place], [place] [address]    Report Status PENDING  Incomplete  MRSA PCR Screening     Status: None   Collection Time: 12/25/18 10:05 AM  Result Value Ref Range Status   MRSA by PCR NEGATIVE NEGATIVE Final    Comment:  The GeneXpert MRSA Assay (FDA approved for NASAL specimens only), is one component of a comprehensive MRSA colonization surveillance program. It is not intended to diagnose MRSA infection nor to guide or monitor treatment for MRSA infections. Performed at [facility], [place] [address], [place], [place] [address]     Radiology Reports Dg Chest 2 View  Result Date: 12/09/2018 CLINICAL DATA:  Chest pain. EXAM: CHEST - 2 VIEW COMPARISON:  Chest x-ray dated September 18, 2017. FINDINGS: The heart size and mediastinal contours are within normal limits. Normal pulmonary vascularity. Lungs remain hyperinflated. No focal consolidation, pleural effusion, or pneumothorax. No acute osseous abnormality. IMPRESSION: No active cardiopulmonary disease. Electronically Signed   By: Titus Dubin M.D.   On: 12/09/2018 14:08   Dg Abd 1 View  Result Date: 12/10/2018 CLINICAL DATA:  Emesis. EXAM: ABDOMEN - 1 VIEW COMPARISON:  None. FINDINGS: Supine abdomen shows no gaseous bowel dilatation to suggest overt obstruction. Air is seen scattered along segments of the colon. Status post lumbar fusion. Right hip replacement noted. Coarse calcification over the central pelvis likely related to fibroid disease. IMPRESSION: Nonspecific bowel gas pattern. Electronically Signed   By: Misty Stanley M.D.   On: 12/10/2018  18:04   Ct Head Wo Contrast  Result Date: 12/24/2018 CLINICAL DATA:  Multiple falls and altered mental status EXAM: CT HEAD WITHOUT CONTRAST CT CERVICAL SPINE WITHOUT CONTRAST TECHNIQUE: Multidetector CT imaging of the head and cervical spine was performed following the standard protocol without intravenous contrast. Multiplanar CT image reconstructions of the cervical spine were also generated. COMPARISON:  06/23/2017 FINDINGS: CT HEAD FINDINGS Brain: No evidence of acute infarction, hemorrhage, hydrocephalus, extra-axial collection or mass lesion/mass effect. Mild borderline moderate patchy low-density in the cerebral white matter attributed to chronic small vessel ischemia given vascular risk factors and stability. Mild for age cerebral volume loss Vascular: Atherosclerotic calcification Skull: Negative Sinuses/Orbits: Postoperative right ethmoid sinuses. Bilateral cataract resection. No acute finding CT CERVICAL SPINE FINDINGS Alignment: Degenerative anterolisthesis at C4-5 and C7-T1 measuring up to 4 mm. Skull base and vertebrae: No evidence of acute fracture. Marked motion degradation at the level of T2 and T3 Soft tissues and spinal canal: No gross canal hematoma or prevertebral edema. Atherosclerotic calcification. Disc levels: Generalized disc degeneration particularly severe at C7-T1 and T1-2 where there is disc collapse and sclerosis. Diffuse facet arthropathy most advanced at C7-T1 where there is joint distortion and anterolisthesis. Ankylosis has occurred at C6-7. Upper chest: Emphysema with motion. IMPRESSION: No evidence of acute intracranial or cervical spine injury. Electronically Signed   By: Monte Fantasia M.D.   On: 12/24/2018 15:29   Ct Cervical Spine Wo Contrast  Result Date: 12/24/2018 CLINICAL DATA:  Multiple falls and altered mental status EXAM: CT HEAD WITHOUT CONTRAST CT CERVICAL SPINE WITHOUT CONTRAST TECHNIQUE: Multidetector CT imaging of the head and cervical spine was performed  following the standard protocol without intravenous contrast. Multiplanar CT image reconstructions of the cervical spine were also generated. COMPARISON:  06/23/2017 FINDINGS: CT HEAD FINDINGS Brain: No evidence of acute infarction, hemorrhage, hydrocephalus, extra-axial collection or mass lesion/mass effect. Mild borderline moderate patchy low-density in the cerebral white matter attributed to chronic small vessel ischemia given vascular risk factors and stability. Mild for age cerebral volume loss Vascular: Atherosclerotic calcification Skull: Negative Sinuses/Orbits: Postoperative right ethmoid sinuses. Bilateral cataract resection. No acute finding CT CERVICAL SPINE FINDINGS Alignment: Degenerative anterolisthesis at C4-5 and C7-T1 measuring up to 4 mm. Skull base and vertebrae: No evidence of acute  fracture. Marked motion degradation at the level of T2 and T3 Soft tissues and spinal canal: No gross canal hematoma or prevertebral edema. Atherosclerotic calcification. Disc levels: Generalized disc degeneration particularly severe at C7-T1 and T1-2 where there is disc collapse and sclerosis. Diffuse facet arthropathy most advanced at C7-T1 where there is joint distortion and anterolisthesis. Ankylosis has occurred at C6-7. Upper chest: Emphysema with motion. IMPRESSION: No evidence of acute intracranial or cervical spine injury. Electronically Signed   By: Monte Fantasia M.D.   On: 12/24/2018 15:29   Nm Pulmonary Vent And Perf (v/q Scan)  Result Date: 12/10/2018 CLINICAL DATA:  Left substernal chest pain and shortness of breath. EXAM: NUCLEAR MEDICINE VENTILATION - PERFUSION LUNG SCAN TECHNIQUE: Ventilation images were obtained in multiple projections using inhaled aerosol Tc-90m DTPA. Perfusion images were obtained in multiple projections after intravenous injection of Tc-108m MAA. RADIOPHARMACEUTICALS:  31.8 mCi of Tc-13m DTPA aerosol inhalation and 4.37 mCi Tc62m MAA IV COMPARISON:  Chest radiograph  12/09/2018 FINDINGS: Ventilation: Patchy radiotracer distribution throughout the lung parenchyma with scattered ventilation defects. Perfusion: There is a wedge shaped peripheral perfusion defects in the left mid lung, however it is matched with a ventilation defect with a similar distribution. Therefore, there is a low probability of this representing a pulmonary embolus, unless it is associated with atelectatic lung parenchyma. IMPRESSION: Wedge shaped peripheral perfusion defects in the left mid lung, which is matched with a ventilation defect with a similar distribution. Therefore, there is a low probability of this representing a pulmonary embolus, unless it is associated with atelectatic lung parenchyma. Patchy radiotracer distribution throughout the lung parenchyma, suggestive of COPD. Electronically Signed   By: Fidela Salisbury M.D.   On: 12/10/2018 09:42   Dg Chest Portable 1 View  Result Date: 12/24/2018 CLINICAL DATA:  Multiple falls and altered mental status. EXAM: PORTABLE CHEST 1 VIEW COMPARISON:  12/11/2018 FINDINGS: Midline trachea. Cardiomegaly accentuated by AP portable technique. No pleural effusion or pneumothorax. Increase in left base airspace disease. High-riding humeral heads. IMPRESSION: 1. Increase in left base airspace disease, suspicious for infection or aspiration. 2. Cardiomegaly without congestive failure. Electronically Signed   By: Abigail Miyamoto M.D.   On: 12/24/2018 14:34   Dg Chest Port 1 View  Result Date: 12/11/2018 CLINICAL DATA:  Increased shortness of breath EXAM: PORTABLE CHEST 1 VIEW COMPARISON:  12/10/2018 FINDINGS: There is hazy left lower lobe airspace disease concerning for atelectasis versus pneumonia. There is no pleural effusion or pneumothorax. The heart and mediastinal contours are unremarkable. 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 IMPRESSION: Hazy left lower lobe airspace disease concerning for atelectasis versus pneumonia.  Electronically Signed   By: Kathreen Devoid   On: 12/11/2018 11:21   Dg Chest Portable 1 View  Result Date: 12/10/2018 CLINICAL DATA:  Hypoxia EXAM: PORTABLE CHEST 1 VIEW COMPARISON:  Chest radiograph from one day prior. FINDINGS: Stable cardiomediastinal silhouette with normal heart size. No pneumothorax. No pleural effusion. No pulmonary edema. Mild hazy left lung base opacity. IMPRESSION: Mild hazy left lung base opacity, which could represent atelectasis, aspiration or pneumonia. Recommend short-term follow-up PA and lateral chest radiographs. Electronically Signed   By: Ilona Sorrel M.D.   On: 12/10/2018 18:04   Vas [place] Lower Extremity Venous (dvt)  Result Date: 12/12/2018  Lower Venous Study Indications: Pain.  Risk Factors: PAD. Performing Technologist: Toma Copier RVS  Examination Guidelines: A complete evaluation includes B-mode imaging, spectral Doppler, color Doppler, and power Doppler as needed of all accessible portions  of each vessel. Bilateral testing is considered an integral part of a complete examination. Limited examinations for reoccurring indications may be performed as noted.  Right Venous Findings: +---------+---------------+---------+-----------+----------+-------+          CompressibilityPhasicitySpontaneityPropertiesSummary +---------+---------------+---------+-----------+----------+-------+ CFV      Full           Yes      Yes                          +---------+---------------+---------+-----------+----------+-------+ SFJ      Full                                                 +---------+---------------+---------+-----------+----------+-------+ FV Prox  Full           Yes      Yes                          +---------+---------------+---------+-----------+----------+-------+ FV Mid   Full                                                 +---------+---------------+---------+-----------+----------+-------+ FV DistalFull           Yes      Yes                           +---------+---------------+---------+-----------+----------+-------+ PFV      Full           Yes      Yes                          +---------+---------------+---------+-----------+----------+-------+ POP      Full           Yes      Yes                          +---------+---------------+---------+-----------+----------+-------+ PTV      Full                                                 +---------+---------------+---------+-----------+----------+-------+ PERO     Full                                                 +---------+---------------+---------+-----------+----------+-------+  Left Venous Findings: +---------+---------------+---------+-----------+----------+-------------------+          CompressibilityPhasicitySpontaneityPropertiesSummary             +---------+---------------+---------+-----------+----------+-------------------+ CFV      Full                                                             +---------+---------------+---------+-----------+----------+-------------------+  SFJ      Full                                                             +---------+---------------+---------+-----------+----------+-------------------+ FV Prox  Full                                                             +---------+---------------+---------+-----------+----------+-------------------+ FV Mid   Full                                                             +---------+---------------+---------+-----------+----------+-------------------+ FV DistalFull                                                             +---------+---------------+---------+-----------+----------+-------------------+ PFV      Full                                                             +---------+---------------+---------+-----------+----------+-------------------+ POP      Full                                                              +---------+---------------+---------+-----------+----------+-------------------+ PTV      Full                                                             +---------+---------------+---------+-----------+----------+-------------------+ PERO                                                  Unable to visualize                                                       well enough to  fully evaluate due                                                        to size of the vein +---------+---------------+---------+-----------+----------+-------------------+    Summary: Right: There is no evidence of deep vein thrombosis in the lower extremity. No cystic structure found in the popliteal fossa. Left: There is no evidence of deep vein thrombosis in the lower extremity. No cystic structure found in the popliteal fossa.  *See table(s) above for measurements and observations. Electronically signed by Harold Barban MD on 12/12/2018 at 7:07:04 PM.    Final     Time Spent in minutes  35   Heath Badon M.D on 12/26/2018 at 10:53 AM  Between 7am to 7pm - Pager - [phone]  After 7pm go to [email or web address] - password [facility]  [facility] -  Office  [phone]

## 2018-12-27 ENCOUNTER — Inpatient Hospital Stay (HOSPITAL_COMMUNITY): Payer: Medicare HMO

## 2018-12-27 ENCOUNTER — Encounter (HOSPITAL_COMMUNITY): Payer: Self-pay

## 2018-12-27 DIAGNOSIS — J690 Pneumonitis due to inhalation of food and vomit: Principal | ICD-10-CM

## 2018-12-27 DIAGNOSIS — N390 Urinary tract infection, site not specified: Secondary | ICD-10-CM

## 2018-12-27 DIAGNOSIS — G92 Toxic encephalopathy: Secondary | ICD-10-CM

## 2018-12-27 LAB — BASIC METABOLIC PANEL
Anion gap: 11 (ref 5–15)
BUN: 25 mg/dL — ABNORMAL HIGH (ref 8–23)
CO2: 19 mmol/L — ABNORMAL LOW (ref 22–32)
Calcium: 9 mg/dL (ref 8.9–10.3)
Chloride: 111 mmol/L (ref 98–111)
Creatinine, Ser: 1.35 mg/dL — ABNORMAL HIGH (ref 0.44–1.00)
GFR calc Af Amer: 44 mL/min — ABNORMAL LOW (ref >60)
GFR calc non Af Amer: 38 mL/min — ABNORMAL LOW (ref >60)
Glucose, Bld: 154 mg/dL — ABNORMAL HIGH (ref 70–99)
Potassium: 4.8 mmol/L (ref 3.5–5.1)
Sodium: 141 mmol/L (ref 135–145)

## 2018-12-27 LAB — ADENOVIRUS ANTIBODIES: Adenovirus Antibody: NEGATIVE

## 2018-12-27 LAB — LITHIUM LEVEL: Lithium Lvl: 0.96 mmol/L (ref 0.60–1.20)

## 2018-12-27 LAB — LEGIONELLA PNEUMOPHILA SEROGP 1 UR AG: L. pneumophila Serogp 1 Ur Ag: NEGATIVE

## 2018-12-27 MED ORDER — LIP MEDEX EX OINT
TOPICAL_OINTMENT | CUTANEOUS | Status: AC
  Administered 2018-12-27: 14:00:00
  Filled 2018-12-27: qty 7

## 2018-12-27 MED ORDER — LORAZEPAM 2 MG/ML IJ SOLN
1.0000 mg | Freq: Once | INTRAMUSCULAR | Status: AC
  Administered 2018-12-27: 1 mg via INTRAVENOUS
  Filled 2018-12-27: qty 1

## 2018-12-27 NOTE — Care Management Important Message (Signed)
Important Message  Patient Details  Name: Alyssa [name] MRN: [id number] Date of Birth: September 05, 1941   Medicare Important Message Given:  Yes    Kerin Salen 12/27/2018, 10:56 AMImportant Message  Patient Details  Name: Alyssa [name] MRN: [id number] Date of Birth: November 08, 1940   Medicare Important Message Given:  Yes    Kerin Salen 12/27/2018, 10:56 AM

## 2018-12-27 NOTE — Progress Notes (Signed)
Patient's is very agiatated and combative. Pulling at restraints and trying to get out of bed. Tech assisted patient to [facility]. Patient communicating verbal aggression and swung at tech. Will paged on call for restraint and sitter order to renew, and med to calm patient.

## 2018-12-27 NOTE — Progress Notes (Signed)
PROGRESS NOTE    Alyssa [name]  RCV:[id number] DOB: December 18, 1940 DOA: 12/24/2018 PCP: Everardo Beals, NP    Brief Narrative:  78 year old female with history of stage IV chronic kidney disease, peripheral vascular disease status post femorofemoral bypass in 2013, chronic pain syndrome, hypertension, bipolar disorder, diabetes mellitus (new diagnosis) and ongoing tobacco use who was hospitalized recently (2 weeks back) with NSTEMI, seen by cardiology and recommended conservative management with aspirin, Plavix, statin and Imdur.  Patient was discharged to SNF on 2/26.  She was brought to the ED on 3/8 after frequent falls at the facility and increased confusion.  In the ED she was found to be agitated, confused and required a sitter for safety.  She was afebrile and vitals were stable except for mildly elevated systolic blood pressure.  Sats dropped down quickly to 78%.  Blood work showed elevated RBC of 13. 9K, elevated platelets of 602 with chest x-ray showing left basilar infiltrate and cardiomegaly.  CT of the head and cervical spine without acute findings.  EKG showed prolonged QTC. Patient admitted for acute toxic metabolic encephalopathy with acute hypoxic respiratory failure secondary to healthcare associated pneumonia and possible UTI. Assessment & Plan:   Principal Problem:   Encephalopathy, toxic Active Problems:   Failure to thrive in adult   CKD (chronic kidney disease) stage 4, GFR 15-29 ml/min (HCC)   Chronic pain syndrome   Dyspnea on exertion   Aspiration pneumonia of both lower lobes due to gastric secretions (HCC)   Acute respiratory failure with hypoxia Sec to hcap vs aspiration.  SLP eval ordered.  Pending legionella antigen. Continue with antibiotics for another 24 hours and repeat CXR.  She is weaned off oxygen.    Acute toxic metabolic encephalopathy;  Not improved.  If no improvement in the next 24 hours, will need MRI brain for further eval.  Getting  repeat CXR.   Stage 4 CKD: Creatinine at baseline.    Hypernatremia:  Improved. Probably from free water deficit.    Recent NSETMI;  Conservative management recommended by  cardiology/     DVT prophylaxis: lovenox.  Code Status: full code.  Family Communication: family at bedside.  Disposition Plan: pending clinical improvement.    Consultants:   Palliative. care  Procedures: none. Antimicrobials:cefepime   Subjective: No new complaints.   Objective: Vitals:   12/26/18 0701 12/26/18 1300 12/26/18 2144 12/27/18 0803  BP: 105/81 (!) 128/93 (!) 161/105 (!) 189/97  Pulse:  74 67 65  Resp:  20 17 16   Temp:  98.7 F (37.1 C) 97.7 F (36.5 C) 97.9 F (36.6 C)  TempSrc:  Axillary    SpO2:  98% 98% 97%  Weight:      Height:        Intake/Output Summary (Last 24 hours) at 12/27/2018 1439 Last data filed at 12/27/2018 0500 Gross per 24 hour  Intake 760.52 ml  Output --  Net 760.52 ml   Filed Weights   12/24/18 1809  Weight: 41.6 kg    Examination:  General exam: Appears calm and comfortable  Respiratory system: Clear to auscultation. Respiratory effort normal. Cardiovascular system: S1 & S2 heard, RRR. Gastrointestinal system: Abdomen is nondistended, soft and nontender. No organomegaly or masses felt. Normal bowel sounds heard. Central nervous system: Alert and oriented. No focal neurological deficits. Extremities: Symmetric 5 x 5 power. Skin: No rashes, lesions or ulcers Psychiatry: Mood & affect appropriate.     Data Reviewed: I have personally reviewed following labs and imaging  studies  CBC: Recent Labs  Lab 12/24/18 1311 12/25/18 0921  WBC 13.9* 12.0*  NEUTROABS 10.8* 9.3*  HGB 9.7* 9.2*  HCT 30.5* 28.9*  MCV 86.6 86.3  PLT 602* 710*   Basic Metabolic Panel: Recent Labs  Lab 12/24/18 1311 12/25/18 0921 12/26/18 0546 12/27/18 0650  NA 141 145 146* 141  K 4.6 4.2 5.0 4.8  CL 110 115* 119* 111  CO2 22 19* 15* 19*  GLUCOSE 104*  99 100* 154*  BUN 53* 43* 34* 25*  CREATININE 1.87* 1.59* 1.44* 1.35*  CALCIUM 9.5 9.2 9.5 9.0  MG  --  2.0 1.9  --    GFR: Estimated Creatinine Clearance: 23.6 mL/min (A) (by C-G formula based on SCr of 1.35 mg/dL (H)). Liver Function Tests: Recent Labs  Lab 12/24/18 1311  AST 30  ALT 27  ALKPHOS 111  BILITOT 0.7  PROT 7.1  ALBUMIN 3.1*   No results for input(s): LIPASE, AMYLASE in the last 168 hours. Recent Labs  Lab 12/24/18 1311  AMMONIA 10   Coagulation Profile: No results for input(s): INR, PROTIME in the last 168 hours. Cardiac Enzymes: No results for input(s): CKTOTAL, CKMB, CKMBINDEX, TROPONINI in the last 168 hours. BNP (last 3 results) No results for input(s): PROBNP in the last 8760 hours. HbA1C: No results for input(s): HGBA1C in the last 72 hours. CBG: No results for input(s): GLUCAP in the last 168 hours. Lipid Profile: No results for input(s): CHOL, HDL, LDLCALC, TRIG, CHOLHDL, LDLDIRECT in the last 72 hours. Thyroid Function Tests: No results for input(s): TSH, T4TOTAL, FREET4, T3FREE, THYROIDAB in the last 72 hours. Anemia Panel: Recent Labs    12/26/18 1143  VITAMINB12 620   Sepsis Labs: Recent Labs  Lab 12/24/18 1311  LATICACIDVEN 0.9    Recent Results (from the past 240 hour(s))  Urine culture     Status: None   Collection Time: 12/24/18  1:11 PM  Result Value Ref Range Status   Specimen Description   Final    URINE, RANDOM Performed at [facility] [address], [place], [place] [address]    Special Requests   Final    NONE Performed at [facility], [place] [address] [place], [place] [address]    Culture   Final    NO GROWTH Performed at [facility], [place] [address] [place], [place] [address]    Report Status 12/25/2018 FINAL  Final  Respiratory Panel by PCR     Status: None   Collection Time: 12/24/18  4:39 PM  Result Value Ref Range Status   Adenovirus NOT DETECTED NOT  DETECTED Final   Coronavirus 229E NOT DETECTED NOT DETECTED Final    Comment: (NOTE) The Coronavirus on the Respiratory Panel, DOES NOT test for the novel  Coronavirus (2019 nCoV)    Coronavirus HKU1 NOT DETECTED NOT DETECTED Final   Coronavirus NL63 NOT DETECTED NOT DETECTED Final   Coronavirus OC43 NOT DETECTED NOT DETECTED Final   Metapneumovirus NOT DETECTED NOT DETECTED Final   Rhinovirus / Enterovirus NOT DETECTED NOT DETECTED Final   Influenza A NOT DETECTED NOT DETECTED Final   Influenza B NOT DETECTED NOT DETECTED Final   Parainfluenza Virus 1 NOT DETECTED NOT DETECTED Final   Parainfluenza Virus 2 NOT DETECTED NOT DETECTED Final   Parainfluenza Virus 3 NOT DETECTED NOT DETECTED Final   Parainfluenza Virus 4 NOT DETECTED NOT DETECTED Final   Respiratory Syncytial Virus NOT DETECTED NOT DETECTED Final  Bordetella pertussis NOT DETECTED NOT DETECTED Final   Chlamydophila pneumoniae NOT DETECTED NOT DETECTED Final   Mycoplasma pneumoniae NOT DETECTED NOT DETECTED Final    Comment: Performed at [facility], [place] [address], [place], [place] [address]  Culture, blood (routine x 2) Call MD if unable to obtain prior to antibiotics being given     Status: None (Preliminary result)   Collection Time: 12/24/18  7:06 PM  Result Value Ref Range Status   Specimen Description   Final    BLOOD RIGHT ANTECUBITAL Performed at [facility] [address] [place], [place] [address]    Special Requests   Final    BOTTLES DRAWN AEROBIC ONLY Blood Culture results may not be optimal due to an inadequate volume of blood received in culture bottles Performed at [facility] [address], [place], [place] [address]    Culture   Final    NO GROWTH 3 DAYS Performed at [facility], [place] [address], [place], [place] [address]    Report Status PENDING  Incomplete  Culture, blood (routine x 2) Call MD if unable to obtain prior to antibiotics  being given     Status: None (Preliminary result)   Collection Time: 12/24/18  7:06 PM  Result Value Ref Range Status   Specimen Description   Final    BLOOD RIGHT HAND Performed at [facility], [place] [address] [place], [place] [address]    Special Requests   Final    BOTTLES DRAWN AEROBIC ONLY Blood Culture results may not be optimal due to an inadequate volume of blood received in culture bottles Performed at [facility] [address] [place], [place] [address]    Culture   Final    NO GROWTH 3 DAYS Performed at [facility], [place] [address], [place], [place] [address]    Report Status PENDING  Incomplete  MRSA PCR Screening     Status: None   Collection Time: 12/25/18 10:05 AM  Result Value Ref Range Status   MRSA by PCR NEGATIVE NEGATIVE Final    Comment:        The GeneXpert MRSA Assay (FDA approved for NASAL specimens only), is one component of a comprehensive MRSA colonization surveillance program. It is not intended to diagnose MRSA infection nor to guide or monitor treatment for MRSA infections. Performed at [facility], [place] [address], [place],  [address]          Radiology Studies: No results found.      Scheduled Meds:  aspirin EC  81 mg Oral Daily   atorvastatin  80 mg Oral q1800   clopidogrel  75 mg Oral Daily   ferrous sulfate  325 mg Oral Q M,W,F   heparin  5,000 Units Subcutaneous Q8H   isosorbide mononitrate  60 mg Oral Daily   metoprolol tartrate  50 mg Oral BID   pantoprazole  40 mg Oral Daily   Continuous Infusions:  ceFEPime (MAXIPIME) IV 1 g (12/26/18 2304)   dextrose 5 % 1,000 mL with sodium bicarbonate 50 mEq infusion 75 mL/hr at 12/26/18 1600     LOS: 3 days    Time spent: 35 minutes.     Hosie Poisson, MD [facility] Pager [phone]   If 7PM-7AM, please contact night-coverage [email or web address] Password [facility] 12/27/2018, 2:39 PM

## 2018-12-27 NOTE — Progress Notes (Signed)
Ativan given. Patient is now resting with no combativeness or verbal aggression.

## 2018-12-27 NOTE — Evaluation (Signed)
Physical Therapy Evaluation Patient Details Name: Alyssa [name] MRN: [id number] DOB: 1941-03-20 Today's Date: 12/27/2018   History of Present Illness  78 yo female admitted to ED on 3/8 with multiple falls, AMS due to suspected UTI. PMH includes anxiety, OA, chest pain with NSTEMI 12/13/18, CKD, HLD, HTN, peripheral neuropathy, PVD, chronic pain, FTT, depression, L foot drop, R LLD, back surgery, femoral bypass graft, bipolar disorder, R THA.   Clinical Impression   Pt presents with confusion, difficulty performing all mobility tasks, unsteadiness in standing, poor safety awareness, and decreased tolerance for activity. Pt to benefit from acute PT to address deficits. Pt ambulated 12 ft with RW with mod assist for safety and steadying, as pt was unpredictable during ambulation (reaching out for walls, stumbling, running into walls). PT recommending return to SNF, family is adamant that pt not d/c to [facility]. PT to progress mobility as tolerated, and will continue to follow acutely.       Follow Up Recommendations SNF    Equipment Recommendations  None recommended by PT    Recommendations for Other Services       Precautions / Restrictions Precautions Precautions: Fall Precaution Comments: Multiples falls in the past few weeks  Restrictions Weight Bearing Restrictions: No      Mobility  Bed Mobility   Bed Mobility: Rolling;Supine to Sit Rolling: Supervision;Mod assist   Supine to sit: Max assist;+2 for physical assistance     General bed mobility comments: supervision assist to roll towards R, performed spontaneously. Mod assist to roll towards L with assist via use of bed pad. Max assist +2 for supine to sit for trunk elevation, LE management, and scooting to EOB with use of bed pad.   Transfers Overall transfer level: Needs assistance Equipment used: Rolling walker (2 wheeled) Transfers: Sit to/from Stand Sit to Stand: +2 physical assistance;Min assist          General transfer comment: Min assist for power up, steadying, and placement of hands on RW handles. Pt able to stand with minimal cuing once RW was placed in front of her.   Ambulation/Gait Ambulation/Gait assistance: Mod assist;+2 safety/equipment Gait Distance (Feet): 12 Feet Assistive device: Rolling walker (2 wheeled) Gait Pattern/deviations: Step-to pattern;Decreased weight shift to right Gait velocity: decr    General Gait Details: Mod assist for steadying pt, physically directing RW as pt with tendency to run into walls without physical guidance. Pt with toe walking R>L due to LLD. Pt lacking safety awareness, requires multimodal cues to stay within RW, direct self, and for hand placement on RW at all times during ambulation.   Stairs            Wheelchair Mobility    Modified Rankin (Stroke Patients Only)       Balance Overall balance assessment: Needs assistance Sitting-balance support: No upper extremity supported;Feet supported Sitting balance-Leahy Scale: Fair Sitting balance - Comments: able to sit EOB without PT support, cannot tolerate challenge to standing balance   Standing balance support: Bilateral upper extremity supported Standing balance-Leahy Scale: Poor Standing balance comment: relies on RW for UE support                              Pertinent Vitals/Pain Pain Assessment: No/denies pain    Home Living Family/patient expects to be discharged to:: Skilled nursing facility   Available Help at Discharge: [place]  Equipment: Gilford Rile - 2 wheels;Walker - 4 wheels      Prior Function Level of Independence: Needs assistance   Gait / Transfers Assistance Needed: Per pt's significant other and friend, pt has not been walking for the past 3 weeks. Previously, pt was using RW for ambulation  ADL's / Homemaking Assistance Needed: Per pt's significant other and friend, pt was receiving assist in dressing,  bathing, toileting in SNF.         Hand Dominance   Dominant Hand: Right    Extremity/Trunk Assessment   Upper Extremity Assessment Upper Extremity Assessment: Generalized weakness    Lower Extremity Assessment Lower Extremity Assessment: Generalized weakness;RLE deficits/detail;Difficult to assess due to impaired cognition RLE Deficits / Details: Pt with significantly shorter RLE, per friends and family pt has a height-adjusted shoe she wears to address this.     Cervical / Trunk Assessment Cervical / Trunk Assessment: Normal  Communication   Communication: Expressive difficulties(Pt speaks very little, can state name and "thank you")  Cognition Arousal/Alertness: Awake/alert Behavior During Therapy: Restless Overall Cognitive Status: Impaired/Different from baseline Area of Impairment: Orientation;Attention;Following commands;Safety/judgement;Problem solving                 Orientation Level: Disoriented to;Time;Place;Situation Current Attention Level: Sustained   Following Commands: Follows one step commands inconsistently Safety/Judgement: Decreased awareness of deficits;Decreased awareness of safety   Problem Solving: Decreased initiation;Slow processing;Difficulty sequencing;Requires tactile cues;Requires verbal cues        General Comments      Exercises     Assessment/Plan    PT Assessment Patient needs continued PT services  PT Problem List Decreased strength;Decreased mobility;Decreased safety awareness;Decreased cognition;Decreased activity tolerance;Decreased balance;Decreased knowledge of use of DME       PT Treatment Interventions DME instruction;Gait training;Functional mobility training;Therapeutic activities;Therapeutic exercise;Balance training;Patient/family education    PT Goals (Current goals can be found in the Care Plan section)  Acute Rehab PT Goals Patient Stated Goal: return to SNF, not [facility] PT Goal Formulation: With  family Time For Goal Achievement: 01/10/19 Potential to Achieve Goals: Good    Frequency Min 2X/week   Barriers to discharge        Co-evaluation               AM-PAC PT "6 Clicks" Mobility  Outcome Measure Help needed turning from your back to your side while in a flat bed without using bedrails?: A Lot Help needed moving from lying on your back to sitting on the side of a flat bed without using bedrails?: A Lot Help needed moving to and from a bed to a chair (including a wheelchair)?: A Lot Help needed standing up from a chair using your arms (e.g., wheelchair or bedside chair)?: A Little Help needed to walk in hospital room?: A Little Help needed climbing 3-5 steps with a railing? : A Lot 6 Click Score: 14    End of Session Equipment Utilized During Treatment: Gait belt Activity Tolerance: Patient tolerated treatment well;Patient limited by fatigue Patient left: in chair;with family/visitor present;with nursing/sitter in room;with call bell/phone within reach(chair alarm and restraints not applied, as sitter states she would be in room 24/7 and pt was fine to be left free of restraints) Nurse Communication: Mobility status PT Visit Diagnosis: Other abnormalities of gait and mobility (R26.89);History of falling (Z91.81)    Time: 1342-1410 PT Time Calculation (min) (ACUTE ONLY): 28 min   Charges:   PT Evaluation $PT Eval Low Complexity: 1 Low PT Treatments $Gait Training:  8-22 mins        Julien Girt, PT Acute Rehabilitation Services Pager [phone]  Office [phone]  Roxine Caddy D Elonda Husky 12/27/2018, 2:59 PM

## 2018-12-28 LAB — BASIC METABOLIC PANEL
Anion gap: 9 (ref 5–15)
BUN: 21 mg/dL (ref 8–23)
CO2: 22 mmol/L (ref 22–32)
Calcium: 8.5 mg/dL — ABNORMAL LOW (ref 8.9–10.3)
Chloride: 107 mmol/L (ref 98–111)
Creatinine, Ser: 1.35 mg/dL — ABNORMAL HIGH (ref 0.44–1.00)
GFR calc Af Amer: 44 mL/min — ABNORMAL LOW (ref >60)
GFR calc non Af Amer: 38 mL/min — ABNORMAL LOW (ref >60)
Glucose, Bld: 118 mg/dL — ABNORMAL HIGH (ref 70–99)
Potassium: 3.4 mmol/L — ABNORMAL LOW (ref 3.5–5.1)
Sodium: 138 mmol/L (ref 135–145)

## 2018-12-28 LAB — CBC
HCT: 26.7 % — ABNORMAL LOW (ref 36.0–46.0)
Hemoglobin: 8.5 g/dL — ABNORMAL LOW (ref 12.0–15.0)
MCH: 27.6 pg (ref 26.0–34.0)
MCHC: 31.8 g/dL (ref 30.0–36.0)
MCV: 86.7 fL (ref 80.0–100.0)
Platelets: 585 10*3/uL — ABNORMAL HIGH (ref 150–400)
RBC: 3.08 MIL/uL — ABNORMAL LOW (ref 3.87–5.11)
RDW: 15.2 % (ref 11.5–15.5)
WBC: 12.7 10*3/uL — ABNORMAL HIGH (ref 4.0–10.5)
nRBC: 0 % (ref 0.0–0.2)

## 2018-12-28 LAB — MAGNESIUM: Magnesium: 1.7 mg/dL (ref 1.7–2.4)

## 2018-12-28 MED ORDER — MAGNESIUM SULFATE 2 GM/50ML IV SOLN
2.0000 g | Freq: Once | INTRAVENOUS | Status: AC
  Administered 2018-12-28: 2 g via INTRAVENOUS
  Filled 2018-12-28: qty 50

## 2018-12-28 MED ORDER — AMOXICILLIN-POT CLAVULANATE 875-125 MG PO TABS
1.0000 | ORAL_TABLET | Freq: Two times a day (BID) | ORAL | Status: DC
  Administered 2018-12-28 – 2018-12-31 (×5): 1 via ORAL
  Filled 2018-12-28 (×7): qty 1

## 2018-12-28 MED ORDER — POTASSIUM CHLORIDE CRYS ER 20 MEQ PO TBCR
40.0000 meq | EXTENDED_RELEASE_TABLET | Freq: Two times a day (BID) | ORAL | Status: AC
  Administered 2018-12-28: 40 meq via ORAL
  Filled 2018-12-28 (×2): qty 2

## 2018-12-28 NOTE — NC FL2 (Signed)
[place] LEVEL OF CARE SCREENING TOOL     IDENTIFICATION  [name] Birthdate: Dec 09, 1940 Sex: female Admission Date (Current Location): 12/24/2018  [facility] and [place] Number:  Herbalist and Address:  [facility],  [place], [place]      Provider Number: [id number]  Attending Physician Name and Address:  Hosie Poisson, MD  Relative Name and Phone Number:  Raelene Trew [phone]    Current Level of Care: Hospital Recommended Level of Care: [place] Prior Approval Number:    Date Approved/Denied:   PASRR Number: [id number]  Discharge Plan: SNF    Current Diagnoses: Patient Active Problem List   Diagnosis Date Noted  . Encephalopathy, toxic 12/24/2018  . Aspiration pneumonia of both lower lobes due to gastric secretions ([place]) 12/24/2018  . Non-ST elevation (NSTEMI) myocardial infarction ([place])   . Chest pain   . Dyspnea on exertion   . Elevated troponin level 12/09/2018  . Chronic migraine 03/27/2018  . Lumbar radiculopathy 03/27/2018  . Gait abnormality 03/27/2018  . CKD (chronic kidney disease) stage 4, GFR 15-29 ml/min (HCC) 12/28/2016  . Normocytic anemia 12/28/2016  . Chronic pain syndrome 12/28/2016  . Benzodiazepine withdrawal ([place]) 12/28/2016  . Benzodiazepine withdrawal without complication ([place]) 92/33/0076  . Chronic right shoulder pain 12/06/2016  . Fall 09/17/2016  . Fracture of humeral head, closed, right, initial encounter 09/17/2016  . Rib fractures 09/17/2016  . Multiple falls 09/17/2016  . Severe muscle deconditioning 09/17/2016  . Failure to thrive in adult 09/17/2016  . Melena 04/25/2016  . Essential hypertension 04/25/2016  . Anxiety state 04/25/2016  . Tobacco abuse 04/25/2016  . Hyperlipidemia 04/25/2016  . Syncope 04/24/2016  . Syncope and collapse 04/24/2016  . Gait difficulty 01/12/2016  . Foot drop, left 01/12/2016  . Severe  recurrent major depression without psychotic features ([place]) 08/28/2015  . Spondylolisthesis at L4-L5 level 07/30/2015  . PVD (peripheral vascular disease) with claudication ([place]) 07/29/2014  . Weakness-Bilateral arm/leg 07/29/2014  . Numbness-left leg 07/29/2014  . Swelling of limb-Legs 07/29/2014  . Atherosclerosis of native arteries of the extremities with intermittent claudication 09/30/2011    Orientation RESPIRATION BLADDER Height & Weight     Self, Time, Situation, Place  Normal Incontinent Weight: 91 lb 11.4 oz (41.6 kg) Height:  5' 2.99" (160 cm)  BEHAVIORAL SYMPTOMS/MOOD NEUROLOGICAL BOWEL NUTRITION STATUS      Incontinent Diet(dysphasia I- soft diet)  AMBULATORY STATUS COMMUNICATION OF NEEDS Skin   Extensive Assist Verbally Normal                       Personal Care Assistance Level of Assistance  Bathing, Feeding, Dressing Bathing Assistance: Maximum assistance Feeding assistance: Independent Dressing Assistance: Maximum assistance     Functional Limitations Info  Sight, Hearing, Speech Sight Info: Adequate Hearing Info: Adequate Speech Info: Adequate    SPECIAL CARE FACTORS FREQUENCY  PT (By licensed PT), OT (By licensed OT), Speech therapy     PT Frequency: 5x OT Frequency: 5x     Speech Therapy Frequency: 2x      Contractures Contractures Info: Not present    Additional Factors Info  Code Status, Allergies Code Status Info: full code Allergies Info: nka           Current Medications (12/28/2018):  This is the current hospital active medication list Current Facility-Administered Medications  Medication Dose Route Frequency Provider Last Rate Last Dose  . amoxicillin-clavulanate (  AUGMENTIN) 875-125 MG per tablet 1 tablet  1 tablet Oral Q12H Hosie Poisson, MD      . aspirin EC tablet 81 mg  81 mg Oral Daily Swayze, Ava, DO   81 mg at 12/28/18 1050  . atorvastatin (LIPITOR) tablet 80 mg  80 mg Oral q1800 Swayze, Ava, DO   80 mg at 12/25/18  2241  . clopidogrel (PLAVIX) tablet 75 mg  75 mg Oral Daily Swayze, Ava, DO   75 mg at 12/28/18 1050  . dextrose 5 % 1,000 mL with sodium bicarbonate 50 mEq infusion   Intravenous Continuous Dhungel, Nishant, MD 75 mL/hr at 12/28/18 0538    . ferrous sulfate tablet 325 mg  325 mg Oral Q M,W,F Swayze, Ava, DO   325 mg at 12/27/18 0823  . heparin injection 5,000 Units  5,000 Units Subcutaneous Q8H Swayze, Ava, DO   5,000 Units at 12/28/18 0537  . hydrALAZINE (APRESOLINE) injection 10 mg  10 mg Intravenous Q6H PRN Dhungel, Nishant, MD   10 mg at 12/27/18 0824  . isosorbide mononitrate (IMDUR) 24 hr tablet 60 mg  60 mg Oral Daily Swayze, Ava, DO   60 mg at 12/28/18 1050  . metoprolol tartrate (LOPRESSOR) tablet 50 mg  50 mg Oral BID Dhungel, Nishant, MD   50 mg at 12/28/18 1050  . pantoprazole (PROTONIX) EC tablet 40 mg  40 mg Oral Daily Swayze, Ava, DO   40 mg at 12/28/18 1050  . potassium chloride SA (K-DUR,KLOR-CON) CR tablet 40 mEq  40 mEq Oral BID Hosie Poisson, MD   40 mEq at 12/28/18 1050     Discharge Medications: Please see discharge summary for a list of discharge medications.  Relevant Imaging Results:  Relevant Lab Results:   Additional Information SSN: [id number]  Nila Nephew, LCSW

## 2018-12-28 NOTE — Evaluation (Signed)
Clinical/Bedside Swallow Evaluation Patient Details  Name: Alyssa [name] MRN: [id number] Date of Birth: 11-May-1941  Today's Date: 12/28/2018 Time: SLP Start Time (ACUTE ONLY): 1152 SLP Stop Time (ACUTE ONLY): 1235 SLP Time Calculation (min) (ACUTE ONLY): 43 min  Past Medical History:  Past Medical History:  Diagnosis Date  . Anxiety   . Arthritis   . Chest pain   . Chronic kidney disease   . Elevated troponin 12/13/2018  . Headache(784.0)   . Hyperlipidemia   . Hypertension   . Joint pain   . Leg pain   . Peripheral neuropathy   . Peripheral vascular disease [facility])    Past Surgical History:  Past Surgical History:  Procedure Laterality Date  . ABDOMINAL ANGIOGRAM N/A 10/29/2011   Procedure: ABDOMINAL ANGIOGRAM;  Surgeon: Elam Dutch, MD;  Location: [facility] CATH LAB;  Service: Cardiovascular;  Laterality: N/A;  . BACK SURGERY    . FEMORAL-FEMORAL BYPASS GRAFT  08/31/2010  . JOINT REPLACEMENT Right 2012   Hip  . LOWER EXTREMITY ANGIOGRAPHY  06/14/2017   Procedure: Lower Extremity Angiography;  Surgeon: Adrian Prows, MD;  Location: [place] CV LAB;  Service: Cardiovascular;;  Bilateral limited angio performed  . RENAL ANGIOGRAPHY N/A 06/14/2017   Procedure: Renal Angiography;  Surgeon: Adrian Prows, MD;  Location: [place] CV LAB;  Service: Cardiovascular;  Laterality: N/A;  . TOTAL HIP ARTHROPLASTY     right   HPI:  Pt is a 78 yo female withh/o dementia, CKD, recent NSTEMI with conservative management, confusion and now diagnosed with HCAP.  Pt was recently at [facility] .  Swallow eval ordered.  Concern for left lobe pna present.    Assessment / Plan / Recommendation Clinical Impression  Limited assessment completed due to pt's lack of participation. She demonstrates retained secretions in anterior oral cavity without awareness.  In addition, upon viewing oral cavity *minimal amount she would open* she appears with black covering on tongue.  She denies tongue discomfort nor  sensing coating.  Pt did not brush her teeth even with hand over hand assistance and brushed her arm instead.  In addition, she refused intake including options of icecream, juice, ice.  Her friend did provide her with cup bolus of juice with pt demonstrating and delayed swallow and subtle delayed cough.  Per sitter in room pt with oral pocketing but not difficulty with coughing with liquids.  In addition, she tolerated po medications per RN.  Will down grade diet based on oral deficits/decreased participation to maximize efficiency and safety.  Will follow up for family education - Note palliative consult pending.  Concern for pt meeting nutritional needs with these recurrent admits, falls, NSTEMI, etc.  Educated pt and her friend to importance of oral care to decrease aspiration of secretions - especially reviewing need for dental brushing with soft toothbrush.  Left items for pt's friend to ask son to use with pt when he arrives as pt's friend states she will do what he tells her.  Will follow up for education with family re: chronicity, waxing/waning and progressive nature of dysphagia with pt's with dementia.   SLP Visit Diagnosis: Dysphagia, oral phase (R13.11);Dysphagia, unspecified (R13.10)    Aspiration Risk  Moderate aspiration risk    Diet Recommendation Dysphagia 1 (Puree);Thin liquid   Liquid Administration via: Cup;Straw Medication Administration: Other (Comment)(with puree) Supervision: Full supervision/cueing for compensatory strategies;Staff to assist with self feeding Compensations: Slow rate;Small sips/bites(clean mouth after meals especially as pt allows) Postural Changes: Seated upright at  90 degrees;Remain upright for at least 30 minutes after po intake    Other  Recommendations Oral Care Recommendations: Oral care BID Other Recommendations: Have oral suction available   Follow up Recommendations        Frequency and Duration min 1 x/week  1 week       Prognosis         Swallow Study   General Date of Onset: 12/28/18 HPI: Pt is a 78 yo female withh/o dementia, CKD, recent NSTEMI with conservative management, confusion and now diagnosed with HCAP.  Pt was recently at [facility] .  Swallow eval ordered.  Concern for left lobe pna present.  Previous Swallow Assessment: none in system, her friend advises pt was not being fed prior to admit and intake was poor Diet Prior to this Study: Regular Temperature Spikes Noted: No Respiratory Status: Room air History of Recent Intubation: No Behavior/Cognition: Alert;Impulsive;Doesn't follow directions;Uncooperative;Requires cueing Oral Cavity Assessment: Other (comment)(black coating on tongue, pt does not follow directions for oral motor exam) Oral Care Completed by SLP: No(pt declined to participate or allow SlP nor friend to provide care) Oral Cavity - Dentition: Adequate natural dentition(dentures not here but upper teeth present) Self-Feeding Abilities: Other (Comment)(did not attempt to self feed or brush her teeth) Patient Positioning: Upright in bed Baseline Vocal Quality: Normal Volitional Cough: Cognitively unable to elicit Volitional Swallow: Unable to elicit    Oral/Motor/Sensory Function Overall Oral Motor/Sensory Function: Generalized oral weakness(pt did not follow commands, no facial asymmetry, able to seal lips closed tightly) Velum: (not viewed)   Ice Chips Ice chips: Not tested(refused)   Thin Liquid Thin Liquid: Impaired Presentation: Cup Pharyngeal  Phase Impairments: Suspected delayed Swallow;Cough - Delayed Other Comments: pt's friend placed juice in her mouth and she demonstrated delayed swallow with delayed subtle cough    Nectar Thick Nectar Thick Liquid: Not tested(pt refused)   Honey Thick Honey Thick Liquid: Not tested(refused)   Puree Puree: Not tested(refused)   Solid     Solid: Not tested      Macario Golds 12/28/2018,12:57 PM  Luanna Salk, [place] Pager [phone] Office [phone]

## 2018-12-28 NOTE — Progress Notes (Signed)
PROGRESS NOTE    Alyssa [name]  CXK:[id number] DOB: 1941-10-13 DOA: 12/24/2018 PCP: Everardo Beals, NP    Brief Narrative:  78 year old female with history of stage IV chronic kidney disease, peripheral vascular disease status post femorofemoral bypass in 2013, chronic pain syndrome, hypertension, bipolar disorder, diabetes mellitus (new diagnosis) and ongoing tobacco use who was hospitalized recently (2 weeks back) with NSTEMI, seen by cardiology and recommended conservative management with aspirin, Plavix, statin and Imdur.  Patient was discharged to SNF on 2/26.  She was brought to the ED on 3/8 after frequent falls at the facility and increased confusion.  In the ED she was found to be agitated, confused and required a sitter for safety.  She was afebrile and vitals were stable except for mildly elevated systolic blood pressure.  Sats dropped down quickly to 78%.  Blood work showed elevated RBC of 13. 9K, elevated platelets of 602 with chest x-ray showing left basilar infiltrate and cardiomegaly.  CT of the head and cervical spine without acute findings.  EKG showed prolonged QTC. Patient admitted for acute toxic metabolic encephalopathy with acute hypoxic respiratory failure secondary to healthcare associated pneumonia and possible UTI.   Assessment & Plan:   Principal Problem:   Encephalopathy, toxic Active Problems:   Failure to thrive in adult   CKD (chronic kidney disease) stage 4, GFR 15-29 ml/min (HCC)   Chronic pain syndrome   Dyspnea on exertion   Aspiration pneumonia of both lower lobes due to gastric secretions (HCC)   Acute respiratory failure with hypoxia Sec to hcap vs aspiration.  SLP eval ordered recommending dysphagia 1 diet.  She is weaned off oxygen.  IV antibiotics transitioned to oral Augmentin. CXR shows improvement in the pneumonia. Urine cultures are negative.    Acute toxic metabolic encephalopathy;  Improved with gentle hydration. But she  remains agitated and in restraints.  Will request psychiatry consult for medications for her bipolar disorder.   Stage 4 CKD: Creatinine at baseline.    Hypernatremia:  Improved. Probably from free water deficit. Pt has minimal oral intake.  Continue with gentle hydration.    Recent NSETMI;  Conservative management recommended by  Cardiology. Continue with aspirin and plavix.    Bipolar disorder:  Pt is on lithium which was held for elevated levels.   Hypokalemia and hypomagnesemia:  Replaced.  Repeat tomorrow.   Prolonged QTC; Repeat EKG today and check QTc.    Hypertension:  Uncontrolled. Resume imdue and metoprolol and continue with IV hydralazine.   Chronic pain syndrome: Continue with tylenol.    Diabetes mellitus:  Diet controlled.  Recently diagnosed.   Deconditioned and debility: with poor intake.  High risk for re admission. Poor prognosis.  Recommend palliative care consult. Apparently pt was DNR on last admission and she is full code now.  PT eval recommending SNF.  Social work aware.   Anemia of chronic disease:  - patient's baseline hemoglobin is between 8 to 9.  Continue to monitor. No signs of active bleeding.     DVT prophylaxis: lovenox.  Code Status: full code.  Family Communication: family at bedside.  Disposition Plan: pending clinical improvement.    Consultants:   Palliative. care  Procedures: none.  Antimicrobials:cefepime   Subjective: No new complaints.   Objective: Vitals:   12/26/18 2144 12/27/18 0803 12/27/18 2027 12/28/18 0700  BP: (!) 161/105 (!) 189/97 (!) 157/98 (!) 181/82  Pulse: 67 65 68 76  Resp: 17 16 20 20   Temp: 97.7 F (  36.5 C) 97.9 F (36.6 C)    TempSrc:      SpO2: 98% 97% 100%   Weight:      Height:        Intake/Output Summary (Last 24 hours) at 12/28/2018 1527 Last data filed at 12/27/2018 2051 Gross per 24 hour  Intake -  Output 2 ml  Net -2 ml   Filed Weights   12/24/18 1809   Weight: 41.6 kg    Examination:  General exam: Appears calm and comfortable  Respiratory system: Clear to auscultation. Respiratory effort normal. Cardiovascular system: S1 & S2 heard, RRR. Gastrointestinal system: Abdomen is soft non tender non distended bowel sounds good.  Central nervous system: Alert and confused. Able to move all extremities Extremities: no pedal edema.  Skin: No rashes, lesions or ulcers Psychiatry: agitated.     Data Reviewed: I have personally reviewed following labs and imaging studies  CBC: Recent Labs  Lab 12/24/18 1311 12/25/18 0921 12/28/18 0617  WBC 13.9* 12.0* 12.7*  NEUTROABS 10.8* 9.3*  --   HGB 9.7* 9.2* 8.5*  HCT 30.5* 28.9* 26.7*  MCV 86.6 86.3 86.7  PLT 602* 602* 654*   Basic Metabolic Panel: Recent Labs  Lab 12/24/18 1311 12/25/18 0921 12/26/18 0546 12/27/18 0650 12/28/18 0617  NA 141 145 146* 141 138  K 4.6 4.2 5.0 4.8 3.4*  CL 110 115* 119* 111 107  CO2 22 19* 15* 19* 22  GLUCOSE 104* 99 100* 154* 118*  BUN 53* 43* 34* 25* 21  CREATININE 1.87* 1.59* 1.44* 1.35* 1.35*  CALCIUM 9.5 9.2 9.5 9.0 8.5*  MG  --  2.0 1.9  --  1.7   GFR: Estimated Creatinine Clearance: 23.6 mL/min (A) (by C-G formula based on SCr of 1.35 mg/dL (H)). Liver Function Tests: Recent Labs  Lab 12/24/18 1311  AST 30  ALT 27  ALKPHOS 111  BILITOT 0.7  PROT 7.1  ALBUMIN 3.1*   No results for input(s): LIPASE, AMYLASE in the last 168 hours. Recent Labs  Lab 12/24/18 1311  AMMONIA 10   Coagulation Profile: No results for input(s): INR, PROTIME in the last 168 hours. Cardiac Enzymes: No results for input(s): CKTOTAL, CKMB, CKMBINDEX, TROPONINI in the last 168 hours. BNP (last 3 results) No results for input(s): PROBNP in the last 8760 hours. HbA1C: No results for input(s): HGBA1C in the last 72 hours. CBG: No results for input(s): GLUCAP in the last 168 hours. Lipid Profile: No results for input(s): CHOL, HDL, LDLCALC, TRIG, CHOLHDL,  LDLDIRECT in the last 72 hours. Thyroid Function Tests: No results for input(s): TSH, T4TOTAL, FREET4, T3FREE, THYROIDAB in the last 72 hours. Anemia Panel: Recent Labs    12/26/18 1143  VITAMINB12 620   Sepsis Labs: Recent Labs  Lab 12/24/18 1311  LATICACIDVEN 0.9    Recent Results (from the past 240 hour(s))  Urine culture     Status: None   Collection Time: 12/24/18  1:11 PM  Result Value Ref Range Status   Specimen Description   Final    URINE, RANDOM Performed at [place] [address], [place], [place] [address]    Special Requests   Final    NONE Performed at [facility], [place] [address] [place], [place] [address]    Culture   Final    NO GROWTH Performed at [facility], [place] [address], [place], [place] [address]    Report Status 12/25/2018 FINAL  Final  Respiratory Panel  by PCR     Status: None   Collection Time: 12/24/18  4:39 PM  Result Value Ref Range Status   Adenovirus NOT DETECTED NOT DETECTED Final   Coronavirus 229E NOT DETECTED NOT DETECTED Final    Comment: (NOTE) The Coronavirus on the Respiratory Panel, DOES NOT test for the novel  Coronavirus (2019 nCoV)    Coronavirus HKU1 NOT DETECTED NOT DETECTED Final   Coronavirus NL63 NOT DETECTED NOT DETECTED Final   Coronavirus OC43 NOT DETECTED NOT DETECTED Final   Metapneumovirus NOT DETECTED NOT DETECTED Final   Rhinovirus / Enterovirus NOT DETECTED NOT DETECTED Final   Influenza A NOT DETECTED NOT DETECTED Final   Influenza B NOT DETECTED NOT DETECTED Final   Parainfluenza Virus 1 NOT DETECTED NOT DETECTED Final   Parainfluenza Virus 2 NOT DETECTED NOT DETECTED Final   Parainfluenza Virus 3 NOT DETECTED NOT DETECTED Final   Parainfluenza Virus 4 NOT DETECTED NOT DETECTED Final   Respiratory Syncytial Virus NOT DETECTED NOT DETECTED Final   Bordetella pertussis NOT DETECTED NOT DETECTED Final   Chlamydophila pneumoniae NOT DETECTED NOT DETECTED  Final   Mycoplasma pneumoniae NOT DETECTED NOT DETECTED Final    Comment: Performed at [facility], [address] [place], [place] [address]  Culture, blood (routine x 2) Call MD if unable to obtain prior to antibiotics being given     Status: None (Preliminary result)   Collection Time: 12/24/18  7:06 PM  Result Value Ref Range Status   Specimen Description   Final    BLOOD RIGHT ANTECUBITAL Performed at [place] [address] [place], [place] [address]    Special Requests   Final    BOTTLES DRAWN AEROBIC ONLY Blood Culture results may not be optimal due to an inadequate volume of blood received in culture bottles Performed at [place] [address] [place], [place] [address]    Culture   Final    NO GROWTH 4 DAYS Performed at [facility], [place] [address] [place], [place] [address]    Report Status PENDING  Incomplete  Culture, blood (routine x 2) Call MD if unable to obtain prior to antibiotics being given     Status: None (Preliminary result)   Collection Time: 12/24/18  7:06 PM  Result Value Ref Range Status   Specimen Description   Final    BLOOD RIGHT HAND Performed at [facility], [place] [address] [place], [place] [address]    Special Requests   Final    BOTTLES DRAWN AEROBIC ONLY Blood Culture results may not be optimal due to an inadequate volume of blood received in culture bottles Performed at [place] [address], [place], [place] [address]    Culture   Final    NO GROWTH 4 DAYS Performed at [facility], [place] [address] [place], [place] [address]    Report Status PENDING  Incomplete  MRSA PCR Screening     Status: None   Collection Time: 12/25/18 10:05 AM  Result Value Ref Range Status   MRSA by PCR NEGATIVE NEGATIVE Final    Comment:        The GeneXpert MRSA Assay (FDA approved for NASAL specimens only), is one component of a comprehensive MRSA  colonization surveillance program. It is not intended to diagnose MRSA infection nor to guide or monitor treatment for MRSA infections. Performed at [facility], [place] [address] [place], [place] [address]          Radiology Studies: Dg Chest Port 1 View  Result Date: 12/27/2018 CLINICAL DATA:  78 year old female with a history of current admission. EXAM: PORTABLE CHEST 1 VIEW COMPARISON:  12/24/2018, 12/11/2018 FINDINGS: Cardiomediastinal silhouette unchanged.  Left rotation. No pneumothorax, new confluent airspace disease, or large pleural effusion. Coarsened interstitial markings bilateral lungs, similar to the prior. No displaced fracture. Similar appearance of chronic irregularity at the lateral left lower chest. IMPRESSION: Low lung volumes likely with atelectasis/scarring and no evidence lobar pneumonia or significant edema on the current plain film. Electronically Signed   By: Corrie Mckusick D.O.   On: 12/27/2018 16:18        Scheduled Meds: . amoxicillin-clavulanate  1 tablet Oral Q12H  . aspirin EC  81 mg Oral Daily  . atorvastatin  80 mg Oral q1800  . clopidogrel  75 mg Oral Daily  . ferrous sulfate  325 mg Oral Q M,W,F  . heparin  5,000 Units Subcutaneous Q8H  . isosorbide mononitrate  60 mg Oral Daily  . metoprolol tartrate  50 mg Oral BID  . pantoprazole  40 mg Oral Daily  . potassium chloride  40 mEq Oral BID   Continuous Infusions: . dextrose 5 % 1,000 mL with sodium bicarbonate 50 mEq infusion 75 mL/hr at 12/28/18 0538  . magnesium sulfate 1 - 4 g bolus IVPB       LOS: 4 days    Time spent: 35 minutes.     Hosie Poisson, MD [facility] Pager [phone]   If 7PM-7AM, please contact night-coverage [email or web address] Password [email or web address] 12/28/2018, 3:27 PM

## 2018-12-28 NOTE — Progress Notes (Addendum)
Patient very agitated, restless and pulling at lines. Removed restraints for 30 min and patient trying to get out of bed with IV connected to her. Patient pulling at tele and IV wires. Patient verbally aggressive and communicating threats. Checked restraints no signs of skin breakdown. Offered patient food she refused. Repositioned patient. No prn ordered for agiataion. Will page on call to renew restraint order.

## 2018-12-28 NOTE — Progress Notes (Signed)
Patient still agitated and restless. Performed physical assessment and noted bilateral redness to wrist. Was told from previous shift RN that patient had discoloration to extremities prior to admission. Restraints checked and loosened to provide comfort and prevent skin breakdown.

## 2018-12-28 NOTE — Progress Notes (Signed)
SLP received call from unit secretary.  Sitter from 1502 advised that family was giving her [employer] to eat despite diet being modified due to excessive oral pocketing.  Per Financial controller, sitter had advised family to recommendations but they continued to feed pt.  Advised until Network engineer to inform RN and SLP will follow up next date.  Thanks.   Luanna Salk, [place] [facility] SLP Acute Rehab Services Pager [phone] Office [phone]

## 2018-12-29 DIAGNOSIS — R4182 Altered mental status, unspecified: Secondary | ICD-10-CM

## 2018-12-29 LAB — BASIC METABOLIC PANEL
Anion gap: 12 (ref 5–15)
BUN: 15 mg/dL (ref 8–23)
CO2: 23 mmol/L (ref 22–32)
Calcium: 8.9 mg/dL (ref 8.9–10.3)
Chloride: 102 mmol/L (ref 98–111)
Creatinine, Ser: 1.26 mg/dL — ABNORMAL HIGH (ref 0.44–1.00)
GFR calc Af Amer: 48 mL/min — ABNORMAL LOW (ref >60)
GFR calc non Af Amer: 42 mL/min — ABNORMAL LOW (ref >60)
Glucose, Bld: 142 mg/dL — ABNORMAL HIGH (ref 70–99)
Potassium: 3.5 mmol/L (ref 3.5–5.1)
Sodium: 137 mmol/L (ref 135–145)

## 2018-12-29 LAB — CULTURE, BLOOD (ROUTINE X 2)
Culture: NO GROWTH
Culture: NO GROWTH

## 2018-12-29 MED ORDER — NYSTATIN 100000 UNIT/ML MT SUSP
5.0000 mL | Freq: Four times a day (QID) | OROMUCOSAL | Status: DC
  Administered 2018-12-29 – 2019-01-09 (×15): 500000 [IU] via ORAL
  Filled 2018-12-29 (×15): qty 5

## 2018-12-29 MED ORDER — VALPROATE SODIUM 500 MG/5ML IV SOLN
250.0000 mg | Freq: Two times a day (BID) | INTRAVENOUS | Status: DC
  Administered 2018-12-30 – 2019-01-02 (×7): 250 mg via INTRAVENOUS
  Filled 2018-12-29 (×10): qty 2.5

## 2018-12-29 MED ORDER — HYDRALAZINE HCL 25 MG PO TABS
25.0000 mg | ORAL_TABLET | Freq: Three times a day (TID) | ORAL | Status: DC
  Administered 2018-12-30 – 2019-01-08 (×10): 25 mg via ORAL
  Filled 2018-12-29 (×12): qty 1

## 2018-12-29 MED ORDER — LITHIUM CARBONATE 300 MG PO CAPS
300.0000 mg | ORAL_CAPSULE | Freq: Every day | ORAL | Status: DC
  Administered 2018-12-29: 300 mg via ORAL
  Filled 2018-12-29: qty 1

## 2018-12-29 MED ORDER — TEMAZEPAM 15 MG PO CAPS
15.0000 mg | ORAL_CAPSULE | Freq: Every evening | ORAL | Status: DC | PRN
  Administered 2019-01-05 – 2019-01-08 (×4): 15 mg via ORAL
  Filled 2018-12-29 (×6): qty 1

## 2018-12-29 MED ORDER — POTASSIUM CHLORIDE 10 MEQ/100ML IV SOLN
10.0000 meq | INTRAVENOUS | Status: AC
  Administered 2018-12-29 (×2): 10 meq via INTRAVENOUS
  Filled 2018-12-29: qty 100

## 2018-12-29 MED ORDER — LORAZEPAM 2 MG/ML IJ SOLN
1.0000 mg | Freq: Four times a day (QID) | INTRAMUSCULAR | Status: DC | PRN
  Administered 2018-12-29 – 2018-12-31 (×5): 1 mg via INTRAVENOUS
  Filled 2018-12-29 (×6): qty 1

## 2018-12-29 MED ORDER — FLUCONAZOLE 100MG IVPB
100.0000 mg | INTRAVENOUS | Status: DC
  Administered 2018-12-29: 100 mg via INTRAVENOUS
  Filled 2018-12-29 (×2): qty 50

## 2018-12-29 MED ORDER — POTASSIUM CHLORIDE 10 MEQ/100ML IV SOLN
10.0000 meq | INTRAVENOUS | Status: AC
  Filled 2018-12-29: qty 100

## 2018-12-29 NOTE — Progress Notes (Signed)
PROGRESS NOTE    Alyssa [name]  DDU:[id number] DOB: November 14, 1940 DOA: 12/24/2018 PCP: Everardo Beals, NP    Brief Narrative:  78 year old female with history of stage IV chronic kidney disease, peripheral vascular disease status post femorofemoral bypass in 2013, chronic pain syndrome, hypertension, bipolar disorder, diabetes mellitus (new diagnosis) and ongoing tobacco use who was hospitalized recently (2 weeks back) with NSTEMI, seen by cardiology and recommended conservative management with aspirin, Plavix, statin and Imdur.  Patient was discharged to SNF on 2/26.  She was brought to the ED on 3/8 after frequent falls at the facility and increased confusion.  In the ED she was found to be agitated, confused and required a sitter for safety.  She was afebrile and vitals were stable except for mildly elevated systolic blood pressure.  Sats dropped down quickly to 78%.  Blood work showed elevated RBC of 13. 9K, elevated platelets of 602 with chest x-ray showing left basilar infiltrate and cardiomegaly.  CT of the head and cervical spine without acute findings.  EKG showed prolonged QTC. Patient admitted for acute toxic metabolic encephalopathy with acute hypoxic respiratory failure secondary to healthcare associated pneumonia and possible UTI.   Assessment & Plan:   Principal Problem:   Encephalopathy, toxic Active Problems:   Failure to thrive in adult   CKD (chronic kidney disease) stage 4, GFR 15-29 ml/min (HCC)   Chronic pain syndrome   Dyspnea on exertion   Aspiration pneumonia of both lower lobes due to gastric secretions (HCC)   Acute respiratory failure with hypoxia Sec to hcap vs aspiration.  SLP eval ordered recommending dysphagia 1 diet.  She is weaned off oxygen.  IV antibiotics transitioned to oral Augmentin. CXR shows improvement in the pneumonia. Urine cultures are negative.    Acute toxic metabolic encephalopathy;  Slight improvement with gentle hydration. But  she remains agitated and in restraints.  Psychiatry consulted and she was started on IV valproic acid BID 250 as per Dr Lorette Ang recommendations.   Stage 4 CKD: Creatinine at baseline.    Hypernatremia:  Improved. Probably from free water deficit. Pt has minimal oral intake.  Continue with gentle hydration.    Recent NSETMI;  Conservative management recommended by  Cardiology. Continue with aspirin and plavix.    Bipolar disorder:  Lithium discontinued .   Hypokalemia and hypomagnesemia:  Replaced.  Repeat tomorrow.   Prolonged QTC; Repeat EKG today and check QTc. Replace potassium.  Keep K >4 and mg >2.  Hypertension:  Uncontrolled. Resume imdue and metoprolol and continue with IV hydralazine.   Chronic pain syndrome: Continue with tylenol.    Diabetes mellitus:  Diet controlled.  Recently diagnosed.   Deconditioned and debility: with poor intake.  High risk for re admission. Poor prognosis.  Recommend palliative care consult. Apparently pt was DNR on last admission and she is full code now.  PT eval recommending SNF.  Social work aware.   Anemia of chronic disease:  - patient's baseline hemoglobin is between 8 to 9.  Continue to monitor. No signs of active bleeding.     DVT prophylaxis: heparin sq Code Status: full code.  Family Communication: none at bedside.  Disposition Plan: pending clinical improvement.    Consultants:   Palliative. care  Procedures: none.  Antimicrobials:cefepime   Subjective: Continues to be agitated and in restraints.   Objective: Vitals:   12/27/18 2027 12/28/18 0700 12/28/18 2142 12/29/18 0608  BP: (!) 157/98 (!) 181/82 (!) 187/99 (!) 183/87  Pulse: 68 76  73 65  Resp: 20 20 18 17   Temp:   98.2 F (36.8 C) 97.7 F (36.5 C)  TempSrc:   Axillary Axillary  SpO2: 100%  97% 96%  Weight:      Height:        Intake/Output Summary (Last 24 hours) at 12/29/2018 1513 Last data filed at 12/29/2018 5053 Gross per 24  hour  Intake 4397.93 ml  Output --  Net 4397.93 ml   Filed Weights   12/24/18 1809  Weight: 41.6 kg    Examination:  General exam: Appears calm and comfortable , not in distress Respiratory system: Clear to auscultation. Respiratory effort normal. Cardiovascular system: S1 & S2 heard, RRR. Gastrointestinal system: Abdomen is soft non tender non distended bowel sounds good.  Central nervous system: Alert and confused. Able to move all extremities Extremities: no pedal edema.  Skin: No rashes, lesions or ulcers Psychiatry: calm today.     Data Reviewed: I have personally reviewed following labs and imaging studies  CBC: Recent Labs  Lab 12/24/18 1311 12/25/18 0921 12/28/18 0617  WBC 13.9* 12.0* 12.7*  NEUTROABS 10.8* 9.3*  --   HGB 9.7* 9.2* 8.5*  HCT 30.5* 28.9* 26.7*  MCV 86.6 86.3 86.7  PLT 602* 602* 976*   Basic Metabolic Panel: Recent Labs  Lab 12/25/18 0921 12/26/18 0546 12/27/18 0650 12/28/18 0617 12/29/18 1101  NA 145 146* 141 138 137  K 4.2 5.0 4.8 3.4* 3.5  CL 115* 119* 111 107 102  CO2 19* 15* 19* 22 23  GLUCOSE 99 100* 154* 118* 142*  BUN 43* 34* 25* 21 15  CREATININE 1.59* 1.44* 1.35* 1.35* 1.26*  CALCIUM 9.2 9.5 9.0 8.5* 8.9  MG 2.0 1.9  --  1.7  --    GFR: Estimated Creatinine Clearance: 25.3 mL/min (A) (by C-G formula based on SCr of 1.26 mg/dL (H)). Liver Function Tests: Recent Labs  Lab 12/24/18 1311  AST 30  ALT 27  ALKPHOS 111  BILITOT 0.7  PROT 7.1  ALBUMIN 3.1*   No results for input(s): LIPASE, AMYLASE in the last 168 hours. Recent Labs  Lab 12/24/18 1311  AMMONIA 10   Coagulation Profile: No results for input(s): INR, PROTIME in the last 168 hours. Cardiac Enzymes: No results for input(s): CKTOTAL, CKMB, CKMBINDEX, TROPONINI in the last 168 hours. BNP (last 3 results) No results for input(s): PROBNP in the last 8760 hours. HbA1C: No results for input(s): HGBA1C in the last 72 hours. CBG: No results for input(s):  GLUCAP in the last 168 hours. Lipid Profile: No results for input(s): CHOL, HDL, LDLCALC, TRIG, CHOLHDL, LDLDIRECT in the last 72 hours. Thyroid Function Tests: No results for input(s): TSH, T4TOTAL, FREET4, T3FREE, THYROIDAB in the last 72 hours. Anemia Panel: No results for input(s): VITAMINB12, FOLATE, FERRITIN, TIBC, IRON, RETICCTPCT in the last 72 hours. Sepsis Labs: Recent Labs  Lab 12/24/18 1311  LATICACIDVEN 0.9    Recent Results (from the past 240 hour(s))  Urine culture     Status: None   Collection Time: 12/24/18  1:11 PM  Result Value Ref Range Status   Specimen Description   Final    URINE, RANDOM Performed at [facility] [address] [place], [place] [address]    Special Requests   Final    NONE Performed at [facility], [place] [address] [place], [place] [address]    Culture   Final    NO GROWTH Performed at [facility],  [address] [place], [place] [address]    Report Status 12/25/2018 FINAL  Final  Respiratory Panel by PCR     Status: None   Collection Time: 12/24/18  4:39 PM  Result Value Ref Range Status   Adenovirus NOT DETECTED NOT DETECTED Final   Coronavirus 229E NOT DETECTED NOT DETECTED Final    Comment: (NOTE) The Coronavirus on the Respiratory Panel, DOES NOT test for the novel  Coronavirus (2019 nCoV)    Coronavirus HKU1 NOT DETECTED NOT DETECTED Final   Coronavirus NL63 NOT DETECTED NOT DETECTED Final   Coronavirus OC43 NOT DETECTED NOT DETECTED Final   Metapneumovirus NOT DETECTED NOT DETECTED Final   Rhinovirus / Enterovirus NOT DETECTED NOT DETECTED Final   Influenza A NOT DETECTED NOT DETECTED Final   Influenza B NOT DETECTED NOT DETECTED Final   Parainfluenza Virus 1 NOT DETECTED NOT DETECTED Final   Parainfluenza Virus 2 NOT DETECTED NOT DETECTED Final   Parainfluenza Virus 3 NOT DETECTED NOT DETECTED Final   Parainfluenza Virus 4 NOT DETECTED NOT DETECTED Final   Respiratory  Syncytial Virus NOT DETECTED NOT DETECTED Final   Bordetella pertussis NOT DETECTED NOT DETECTED Final   Chlamydophila pneumoniae NOT DETECTED NOT DETECTED Final   Mycoplasma pneumoniae NOT DETECTED NOT DETECTED Final    Comment: Performed at [facility], [address], [place], [place] [address]  Culture, blood (routine x 2) Call MD if unable to obtain prior to antibiotics being given     Status: None (Preliminary result)   Collection Time: 12/24/18  7:06 PM  Result Value Ref Range Status   Specimen Description   Final    BLOOD RIGHT ANTECUBITAL Performed at [place] [facility] [facility] [address], [place], [place] [address]    Special Requests   Final    BOTTLES DRAWN AEROBIC ONLY Blood Culture results may not be optimal due to an inadequate volume of blood received in culture bottles Performed at [facility] [address] [place], [place] [address]    Culture   Final    NO GROWTH 4 DAYS Performed at [facility], [place] [address], [place], [place] [address]    Report Status PENDING  Incomplete  Culture, blood (routine x 2) Call MD if unable to obtain prior to antibiotics being given     Status: None (Preliminary result)   Collection Time: 12/24/18  7:06 PM  Result Value Ref Range Status   Specimen Description   Final    BLOOD RIGHT HAND Performed at [facility], [place] [address], [place], [place] [address]    Special Requests   Final    BOTTLES DRAWN AEROBIC ONLY Blood Culture results may not be optimal due to an inadequate volume of blood received in culture bottles Performed at [facility] [address], [place], [place] [address]    Culture   Final    NO GROWTH 4 DAYS Performed at [facility], [place] [address] [place], [place] [address]    Report Status PENDING  Incomplete  MRSA PCR Screening     Status: None   Collection Time: 12/25/18 10:05 AM  Result Value Ref Range Status   MRSA by PCR  NEGATIVE NEGATIVE Final    Comment:        The GeneXpert MRSA Assay (FDA approved for NASAL specimens only), is one component of a comprehensive MRSA colonization surveillance program. It is not intended to diagnose MRSA  infection nor to guide or monitor treatment for MRSA infections. Performed at [facility], [place] [address], [place], [place] [address]          Radiology Studies: Dg Chest Port 1 View  Result Date: 12/27/2018 CLINICAL DATA:  78 year old female with a history of current admission. EXAM: PORTABLE CHEST 1 VIEW COMPARISON:  12/24/2018, 12/11/2018 FINDINGS: Cardiomediastinal silhouette unchanged.  Left rotation. No pneumothorax, new confluent airspace disease, or large pleural effusion. Coarsened interstitial markings bilateral lungs, similar to the prior. No displaced fracture. Similar appearance of chronic irregularity at the lateral left lower chest. IMPRESSION: Low lung volumes likely with atelectasis/scarring and no evidence lobar pneumonia or significant edema on the current plain film. Electronically Signed   By: Corrie Mckusick D.O.   On: 12/27/2018 16:18        Scheduled Meds:  amoxicillin-clavulanate  1 tablet Oral Q12H   aspirin EC  81 mg Oral Daily   atorvastatin  80 mg Oral q1800   clopidogrel  75 mg Oral Daily   ferrous sulfate  325 mg Oral Q M,W,F   heparin  5,000 Units Subcutaneous Q8H   isosorbide mononitrate  60 mg Oral Daily   metoprolol tartrate  50 mg Oral BID   nystatin  5 mL Oral QID   pantoprazole  40 mg Oral Daily   Continuous Infusions:  dextrose 5 % 1,000 mL with sodium bicarbonate 50 mEq infusion 75 mL/hr at 12/29/18 0823   fluconazole (DIFLUCAN) IV 100 mg (12/29/18 1342)   valproate sodium       LOS: 5 days    Time spent: 35 minutes.     Hosie Poisson, MD [facility] Pager [phone]   If 7PM-7AM, please contact night-coverage [email or web address] Password [email or web address] 12/29/2018, 3:13 PM

## 2018-12-29 NOTE — Progress Notes (Addendum)
Pt denied wanting any food and refused pudding or applesauce with medications again.   9:50 Pt changed her mind and was willing to take some medications.  Given nystatin and lithium with encouragement to swallow.  Given metoproolol which pt held onto for an extended time in her mouth.  Pt then refused the next medication, only to several minutes later say she would take her medicine.  Pt still had previous bite of metoprolol with applesauce in her mouth.  Sitter remained at bedside to encourage pt's swallowing of current bite.  No further medications given.  Updated Dr. Karleen Hampshire.

## 2018-12-29 NOTE — Consult Note (Signed)
[facility] Face-to-Face Psychiatry Consult   Reason for Consult:  Medication management  Referring Physician:  Dr. Hosie Poisson Patient Identification: Alyssa [name] MRN:  [id number] Principal Diagnosis: Altered mental status Diagnosis:  Principal Problem:   Encephalopathy, toxic Active Problems:   Failure to thrive in adult   CKD (chronic kidney disease) stage 4, GFR 15-29 ml/min (HCC)   Chronic pain syndrome   Dyspnea on exertion   Aspiration pneumonia of both lower lobes due to gastric secretions ([place])   Total Time spent with patient: 1 hour  Subjective:   Alyssa [name] is a 78 y.o. female patient admitted with acute toxic metabolic encephalopathy in the setting of acute hypoxic respiratory failure secondary to HCAP and possible UTI.  HPI:   Per chart review, patient was admitted with acute toxic metabolic encephalopathy in the setting of acute hypoxic respiratory failure secondary to HCAP and possible UTI. She reportedly had increased falls and confusion at her facility. She is agitated and in restraints. Of note, she was last admitted to the hospital for NSTEMI and was discharged to [facility] on 2/26. Home medications include Celexa 40 mg daily, Klonopin 1 mg BID, Gabapentin 300 mg TID, Lithium 300 mg daily and Restoril 30 mg qhs. PMP indicates that she is regularly filling Restoril. She was last prescribed Restoril on 2/3 (#30) by Everardo Beals. There is no record of Klonopin. Lithium was restarted today. Lithium level was elevated to 1.40 on 3/9 and was 0.96 on 3/11. She was given Ativan 1 mg this morning for agitation.   On interview, Alyssa [name] reports that she is doing "pretty good" today. She reports that she came to the hospital because her "blood pressure was too low." She feels like she is breathing better and reports an improvement in dysuria. She denies SI, HI or AVH. She denies problems with sleep or appetite. She is only oriented to self. She reports  that she lives in the hospital and the year is 39 after she was given multiple choices. Her son and POA, Alyssa [name] was contacted for collateral. He reports that this is an acute change in her mental status. She is not confused or psychotic at baseline. She became delusional and was not sleeping well about 3 days ago. She became increasingly confused so she was brought to the hospital. He reports that she is gradually improving in her mental status but still not at her baseline.   Past Psychiatric History: Anxiety, bipolar disorder and MDD. Her son reports that she has never exhibited manic symptoms.   Risk to Self:  None. Denies SI.  Risk to Others:  None. Denies HI.  Prior Inpatient Therapy:   She was hospitalized twice and was last hospitalized 4 years ago.  Prior Outpatient Therapy:  Past medications include Celexa 40 mg daily, Trazodone 50 mg qhs PRN and Ambien 5 mg qhs.   Past Medical History:  Past Medical History:  Diagnosis Date  . Anxiety   . Arthritis   . Chest pain   . Chronic kidney disease   . Elevated troponin 12/13/2018  . Headache(784.0)   . Hyperlipidemia   . Hypertension   . Joint pain   . Leg pain   . Peripheral neuropathy   . Peripheral vascular disease [facility])     Past Surgical History:  Procedure Laterality Date  . ABDOMINAL ANGIOGRAM N/A 10/29/2011   Procedure: ABDOMINAL ANGIOGRAM;  Surgeon: Elam Dutch, MD;  Location: [facility] CATH LAB;  Service: Cardiovascular;  Laterality:  N/A;  . BACK SURGERY    . FEMORAL-FEMORAL BYPASS GRAFT  08/31/2010  . JOINT REPLACEMENT Right 2012   Hip  . LOWER EXTREMITY ANGIOGRAPHY  06/14/2017   Procedure: Lower Extremity Angiography;  Surgeon: Adrian Prows, MD;  Location: [place] CV LAB;  Service: Cardiovascular;;  Bilateral limited angio performed  . RENAL ANGIOGRAPHY N/A 06/14/2017   Procedure: Renal Angiography;  Surgeon: Adrian Prows, MD;  Location: [place] CV LAB;  Service: Cardiovascular;  Laterality: N/A;  . TOTAL HIP  ARTHROPLASTY     right   Family History:  Family History  Problem Relation Age of Onset  . Heart attack Mother   . Heart disease Mother   . Hyperlipidemia Mother   . Hypertension Mother   . Heart attack Father   . Heart disease Father        Before age 81  . Hyperlipidemia Father   . Hypertension Father   . Cancer Sister        brain tumor  . Aneurysm Brother        brain  . Colon cancer Neg Hx   . Liver cancer Neg Hx    Family Psychiatric  History: Denies  Social History:  Social History   Substance and Sexual Activity  Alcohol Use No     Social History   Substance and Sexual Activity  Drug Use No    Social History   Socioeconomic History  . Marital status: Single    Spouse name: Not on [name]  . Number of children: 2  . Years of education: 11th  . Highest education level: Not on [name]  Occupational History  . Occupation: Retired  Scientific laboratory technician  . Financial resource strain: Not on [name]  . Food insecurity:    Worry: Not on [name]    Inability: Not on [name]  . Transportation needs:    Medical: Not on [name]    Non-medical: Not on [name]  Tobacco Use  . Smoking status: Former Smoker    Packs/day: 0.25    Types: Cigarettes    Last attempt to quit: 11/22/2018    Years since quitting: 0.1  . Smokeless tobacco: Never Used  . Tobacco comment: 5 cigs a day   Substance and Sexual Activity  . Alcohol use: No  . Drug use: No  . Sexual activity: Not on [name]  Lifestyle  . Physical activity:    Days per week: Not on [name]    Minutes per session: Not on [name]  . Stress: Not on [name]  Relationships  . Social connections:    Talks on phone: Not on [name]    Gets together: Not on [name]    Attends religious service: Not on [name]    Active member of club or organization: Not on [name]    Attends meetings of clubs or organizations: Not on [name]    Relationship status: Not on [name]  Other Topics Concern  . Not on [name]  Social History Narrative    Lives at home with her brother.    Right-handed.   Occasional caffeine use.   Additional Social History: She lives at [facility]. She denies alcohol or illicit substance use.     Allergies:  No Known Allergies  Labs:  Results for orders placed or performed during the hospital encounter of 12/24/18 (from the past 48 hour(s))  CBC     Status: Abnormal   Collection Time: 12/28/18  6:17 AM  Result Value Ref Range  WBC 12.7 (H) 4.0 - 10.5 K/uL   RBC 3.08 (L) 3.87 - 5.11 MIL/uL   Hemoglobin 8.5 (L) 12.0 - 15.0 g/dL   HCT 26.7 (L) 36.0 - 46.0 %   MCV 86.7 80.0 - 100.0 fL   MCH 27.6 26.0 - 34.0 pg   MCHC 31.8 30.0 - 36.0 g/dL   RDW 15.2 11.5 - 15.5 %   Platelets 585 (H) 150 - 400 K/uL   nRBC 0.0 0.0 - 0.2 %    Comment: Performed at [facility], [place] [address], [place], [place] [address]  Basic metabolic panel     Status: Abnormal   Collection Time: 12/28/18  6:17 AM  Result Value Ref Range   Sodium 138 135 - 145 mmol/L   Potassium 3.4 (L) 3.5 - 5.1 mmol/L    Comment: DELTA CHECK NOTED REPEATED TO VERIFY    Chloride 107 98 - 111 mmol/L   CO2 22 22 - 32 mmol/L   Glucose, Bld 118 (H) 70 - 99 mg/dL   BUN 21 8 - 23 mg/dL   Creatinine, Ser 1.35 (H) 0.44 - 1.00 mg/dL   Calcium 8.5 (L) 8.9 - 10.3 mg/dL   GFR calc non Af Amer 38 (L) >60 mL/min   GFR calc Af Amer 44 (L) >60 mL/min   Anion gap 9 5 - 15    Comment: Performed at [facility], [place] [address], [place], [place] [address]  Magnesium     Status: None   Collection Time: 12/28/18  6:17 AM  Result Value Ref Range   Magnesium 1.7 1.7 - 2.4 mg/dL    Comment: Performed at [facility], [place] [address], [place], [place] [address]  Basic metabolic panel     Status: Abnormal   Collection Time: 12/29/18 11:01 AM  Result Value Ref Range   Sodium 137 135 - 145 mmol/L   Potassium 3.5 3.5 - 5.1 mmol/L   Chloride 102 98 - 111 mmol/L   CO2 23 22 - 32 mmol/L   Glucose, Bld 142 (H) 70 - 99 mg/dL   BUN 15 8  - 23 mg/dL   Creatinine, Ser 1.26 (H) 0.44 - 1.00 mg/dL   Calcium 8.9 8.9 - 10.3 mg/dL   GFR calc non Af Amer 42 (L) >60 mL/min   GFR calc Af Amer 48 (L) >60 mL/min   Anion gap 12 5 - 15    Comment: Performed at [facility], [place] [address] [place],  [address]    Current Facility-Administered Medications  Medication Dose Route Frequency Provider Last Rate Last Dose  . amoxicillin-clavulanate (AUGMENTIN) 875-125 MG per tablet 1 tablet  1 tablet Oral Q12H Hosie Poisson, MD   1 tablet at 12/28/18 2208  . aspirin EC tablet 81 mg  81 mg Oral Daily Swayze, Ava, DO   81 mg at 12/28/18 1050  . atorvastatin (LIPITOR) tablet 80 mg  80 mg Oral q1800 Swayze, Ava, DO   Stopped at 12/28/18 1812  . clopidogrel (PLAVIX) tablet 75 mg  75 mg Oral Daily Swayze, Ava, DO   75 mg at 12/28/18 1050  . dextrose 5 % 1,000 mL with sodium bicarbonate 50 mEq infusion   Intravenous Continuous Dhungel, Nishant, MD 75 mL/hr at 12/29/18 0823    . ferrous sulfate tablet 325 mg  325 mg Oral Q M,W,F Swayze, Ava, DO   325 mg at 12/27/18 0823  . fluconazole (DIFLUCAN) IVPB 100 mg  100 mg Intravenous  Q24H Hosie Poisson, MD      . heparin injection 5,000 Units  5,000 Units Subcutaneous Q8H Swayze, Ava, DO   5,000 Units at 12/29/18 0618  . hydrALAZINE (APRESOLINE) injection 10 mg  10 mg Intravenous Q6H PRN Dhungel, Nishant, MD   10 mg at 12/29/18 0304  . isosorbide mononitrate (IMDUR) 24 hr tablet 60 mg  60 mg Oral Daily Swayze, Ava, DO   60 mg at 12/28/18 1050  . lithium carbonate capsule 300 mg  300 mg Oral Daily Hosie Poisson, MD   300 mg at 12/29/18 0959  . LORazepam (ATIVAN) injection 1 mg  1 mg Intravenous Q6H PRN Allie Bossier, MD   1 mg at 12/29/18 9381  . metoprolol tartrate (LOPRESSOR) tablet 50 mg  50 mg Oral BID Dhungel, Nishant, MD   50 mg at 12/29/18 1005  . nystatin (MYCOSTATIN) 100000 UNIT/ML suspension 500,000 Units  5 mL Oral QID Hosie Poisson, MD   500,000 Units at 12/29/18 0954  .  pantoprazole (PROTONIX) EC tablet 40 mg  40 mg Oral Daily Swayze, Ava, DO   40 mg at 12/28/18 1050    Musculoskeletal: Strength & Muscle Tone: Generalized weakness. Gait & Station: UTA since patient is lying in bed. Patient leans: N/A  Psychiatric Specialty Exam: Physical Exam  Nursing note and vitals reviewed. Constitutional: She appears well-developed.  thin  HENT:  Head: Normocephalic and atraumatic.  Neck: Normal range of motion.  Respiratory: Effort normal.  Musculoskeletal: Normal range of motion.  Neurological: She is alert.  Only oriented to self.  Psychiatric: She has a normal mood and affect. Her speech is normal and behavior is normal. Judgment and thought content normal. Cognition and memory are impaired.    Review of Systems  Gastrointestinal: Positive for nausea. Negative for vomiting.  Psychiatric/Behavioral: Negative for hallucinations, substance abuse and suicidal ideas.  All other systems reviewed and are negative.   Blood pressure (!) 183/87, pulse 65, temperature 97.7 F (36.5 C), temperature source Axillary, resp. rate 17, height 5' 2.99" (1.6 m), weight 41.6 kg, SpO2 96 %.Body mass index is 16.25 kg/m.  General Appearance: Fairly Groomed, thin, elderly, African American female, wearing a hospital gown with thinning hair who is lying in bed. NAD.  Eye Contact:  Good  Speech:  Clear and Coherent and Normal Rate  Volume:  Normal  Mood:  Euthymic  Affect:  Appropriate and Congruent  Thought Process:  Linear and Descriptions of Associations: Intact  Orientation:  Other:  Only oriented to person.  Thought Content:  Logical  Suicidal Thoughts:  No  Homicidal Thoughts:  No  Memory:  Immediate;   Poor Recent;   Fair Remote;   Fair  Judgement:  Poor  Insight:  Shallow  Psychomotor Activity:  Normal  Concentration:  Concentration: Good and Attention Span: Good  Recall:  Poor  Fund of Knowledge:  Fair  Language:  Fair  Akathisia:  No  Handed:  Right   AIMS (if indicated):   N/A  Assets:  Agricultural consultant Housing Social Support  ADL's:  Impaired  Cognition:  Impaired secondary to AMS.   Sleep:   Okay    Assessment:  Alyssa [name] is a 78 y.o. female who was admitted with acute toxic metabolic encephalopathy in the setting of acute hypoxic respiratory failure secondary to HCAP and possible UTI. She reportedly had an acute mental status change over the past 3 days. She is not confused or psychotic at baseline per patient's son.  She is only oriented to self today. She has intermittent episodes of agitation. She has a documented history of bipolar disorder but her son reports that she has never exhibited manic symptoms. Recommend switching Lithium to IV Depakote for agitation since patient is having difficulty swallowing pills. Patient should continue to follow up wit her outpatient provider for medication management.   Treatment Plan Summary: -Discontinue Lithium and start IV Depakote 250 mg BID for agitation/mood stabilization. Recommend resuming Lithium when patient is able to tolerate PO.  -Recommend judicious use of benzodiazepines since can worsen mental status in the elderly population. Place patient on CIWA to monitor for withdrawal since home Restoril discontinued on admission.  -EKG reviewed and QTc 568. Please closely monitor when starting or increasing QTc prolonging agents.  -Psychiatry will sign off on patient at this time. Please consult psychiatry again as needed.    Disposition: Patient does not meet criteria for psychiatric inpatient admission.  Faythe Dingwall, DO 12/29/2018 11:56 AM

## 2018-12-29 NOTE — TOC Progression Note (Signed)
Transition of Care [facility]) - Progression Note    Patient Details  Name: Alyssa [name] MRN: [id number] Date of Birth: 04/15/1941  Transition of Care [facility]) CM/SW Contact  Leeroy Cha, RN Phone Number: 12/29/2018, 11:20 AM  Clinical Narrative:    Patient admitted for acute toxic metabolic encephalopathy with acute hypoxic respiratory failure secondary to healthcare associated pneumonia and possible UTI. Acute toxic metabolic encephalopathy;  Improved with gentle hydration. But she remains agitated and in restraints.  Will request psychiatry consult for medications for her bipolar disorder.   Stage 4 CKD: Creatinine at baseline.    Hypernatremia:  Improved. Probably from free water deficit. Pt has minimal oral intake.  Continue with gentle hydration.    Recent NSETMI;  Conservative management recommended by  Cardiology. Continue with aspirin and plavix.    Bipolar disorder:  Pt is on lithium which was held for elevated levels.   Hypokalemia and hypomagnesemia:  Replaced.  Repeat tomorrow.   Prolonged QTC; Repeat EKG today and check QTc.    Hypertension:  Uncontrolled. Resume imdue and metoprolol and continue with IV hydralazine.   Chronic pain syndrome: Continue with tylenol.    Diabetes mellitus:  Diet controlled.  Recently diagnosed.   Deconditioned and debility: with poor intake.  High risk for re admission. Poor prognosis.  Recommend palliative care consult. Apparently pt was DNR on last admission and she is full code now.  PT eval recommending SNF.  Social work aware.   Anemia of chronic disease:  - patient's baseline hemoglobin is between 8 to 9.  Continue to monitor. No signs of active bleeding.   Expected Discharge Plan: [place] Barriers to Discharge: No Barriers Identified  Expected Discharge Plan and Services Expected Discharge Plan: [place] arrangements for the past 2  months: Skilled Nursing Facility(since 2/26 at [facility]                           Social Determinants of Health (SDOH) Interventions    Readmission Risk Interventions 30 Day Unplanned Readmission Risk Score     ED to Hosp-Admission (Current) from 12/24/2018 in [place]  30 Day Unplanned Readmission Risk Score (%)  33 Filed at 12/29/2018 0801     This score is the patient's risk of an unplanned readmission within 30 days of being discharged (0 -100%). The score is based on dignosis, age, lab data, medications, orders, and past utilization.   Low:  0-14.9   Medium: 15-21.9   High: 22-29.9   Extreme: 30 and above       Readmission Risk Prevention Plan 12/26/2018  Transportation Screening Complete  Medication Review Press photographer) Complete  PCP or Specialist appointment within 3-5 days of discharge Not Complete  PCP/Specialist Appt Not Complete comments pt for snf. Snf MD to see as needed.  [place] or Home Care Consult Not Complete  HRI or Home Care Consult Pt Refusal Comments NA  SW Recovery Care/Counseling Consult Not Complete  SW Consult Not Complete Comments NA  Palliative Care Screening Complete  Skilled Nursing Facility Complete  Some recent data might be hidden

## 2018-12-29 NOTE — Consult Note (Signed)
Consultation Note Date: 12/29/2018   [name]  DOB: [birth year]  MRN: [id number]  Age / Sex: 78 y.o., female  PCP: Everardo Beals, NP Referring Physician: Hosie Poisson, MD  Reason for Consultation: Establishing goals of care  HPI/Patient Profile: 78 y.o. female  with past medical history of  admitted on 12/24/2018 from SNF (just d/c there on 2/26 from [facility] with 5 day stay prior to admit to SNF) with resp failure. PNA (likely aspiration), and encephalopathy.  Behaviors have been major issue and psych has evaluated and made adjustment to medications.  Chart review reveals that she is currently FULL CODE, but was DNR at last discharge.  Palliative consulted for [place].   Clinical Assessment and Goals of Care: I met today with Alyssa [name], Alyssa [name], and Alyssa grandson.  Alyssa [name] is awake and alert and pleasant throughout encounter, but with limited insight into what is going on.  I introduced palliative care as specialized medical care for people living with serious illness. It focuses on providing relief from the symptoms and stress of a serious illness. The goal is to improve quality of life for both the patient and the family.  We discussed clinical course as well as wishes moving forward in regard to advanced directives.  Concepts specific to code status and rehospitalization discussed.  We discussed difference between a aggressive medical intervention path and a palliative, comfort focused care path.  Values and goals of care important to patient and family were attempted to be elicited.  Concept of Hospice and Palliative Care were discussed  Questions and concerns addressed.   PMT will continue to support holistically.  SUMMARY OF RECOMMENDATIONS   - Alyssa son reports understanding that she has progressive illness with continued decline.  Discussed plan to continue to  continue current interventions (particularly waiting to see how Alyssa confusion responds to changes by psych) and reassess based upon Alyssa clinical course over the weekend. Alyssa [name] had discussed code status with Dr. Einar Gip last admission and feels that this is most appropriate and his mother would want DNR status.  It appears that this is not consensus among all of family, however.  He asked today to have time to discuss with his siblings prior to changing back to DNR to ensure they understand and are in agreement.  I have offered another family meeting this weekend and he will call if family is agreeable to meet (probably Sunday per his report).  Code Status/Advance Care Planning:  Full code   Symptom Management:   Agitation- Continue assessment with adjustments by psych  Palliative Prophylaxis:   Delirium Protocol and Frequent Pain Assessment  Additional Recommendations (Limitations, Scope, Preferences):  Full Scope Treatment  Psycho-social/Spiritual:   Desire for further Chaplaincy support:no  Additional Recommendations: Caregiving  Support/Resources  Prognosis:   Guarded  Discharge Planning: To Be Determined      Primary Diagnoses: Present on Admission: . Encephalopathy, toxic . Failure to thrive in adult . CKD (chronic kidney disease) stage 4, GFR 15-29  ml/min ([place]) . Chronic pain syndrome . Dyspnea on exertion . Aspiration pneumonia of both lower lobes due to gastric secretions ([place])   I have reviewed the medical record, interviewed the patient and family, and examined the patient. The following aspects are pertinent.  Past Medical History:  Diagnosis Date  . Anxiety   . Arthritis   . Chest pain   . Chronic kidney disease   . Elevated troponin 12/13/2018  . Headache(784.0)   . Hyperlipidemia   . Hypertension   . Joint pain   . Leg pain   . Peripheral neuropathy   . Peripheral vascular disease ([place])    Social History   Socioeconomic History  . Marital  status: Single    Spouse name: Not on file  . Number of children: 2  . Years of education: 11th  . Highest education level: Not on file  Occupational History  . Occupation: Retired  Scientific laboratory technician  . Financial resource strain: Not on file  . Food insecurity:    Worry: Not on file    Inability: Not on file  . Transportation needs:    Medical: Not on file    Non-medical: Not on file  Tobacco Use  . Smoking status: Former Smoker    Packs/day: 0.25    Types: Cigarettes    Last attempt to quit: 11/22/2018    Years since quitting: 0.1  . Smokeless tobacco: Never Used  . Tobacco comment: 5 cigs a day   Substance and Sexual Activity  . Alcohol use: No  . Drug use: No  . Sexual activity: Not on file  Lifestyle  . Physical activity:    Days per week: Not on file    Minutes per session: Not on file  . Stress: Not on file  Relationships  . Social connections:    Talks on phone: Not on file    Gets together: Not on file    Attends religious service: Not on file    Active member of club or organization: Not on file    Attends meetings of clubs or organizations: Not on file    Relationship status: Not on file  Other Topics Concern  . Not on file  Social History Narrative    Lives at home with Alyssa brother.   Right-handed.   Occasional caffeine use.   Family History  Problem Relation Age of Onset  . Heart attack Mother   . Heart disease Mother   . Hyperlipidemia Mother   . Hypertension Mother   . Heart attack Father   . Heart disease Father        Before age 56  . Hyperlipidemia Father   . Hypertension Father   . Cancer Sister        brain tumor  . Aneurysm Brother        brain  . Colon cancer Neg Hx   . Liver cancer Neg Hx    Scheduled Meds: . amoxicillin-clavulanate  1 tablet Oral Q12H  . aspirin EC  81 mg Oral Daily  . atorvastatin  80 mg Oral q1800  . clopidogrel  75 mg Oral Daily  . ferrous sulfate  325 mg Oral Q M,W,F  . heparin  5,000 Units Subcutaneous Q8H   . isosorbide mononitrate  60 mg Oral Daily  . lithium carbonate  300 mg Oral Daily  . metoprolol tartrate  50 mg Oral BID  . nystatin  5 mL Oral QID  . pantoprazole  40 mg Oral Daily  Continuous Infusions: . dextrose 5 % 1,000 mL with sodium bicarbonate 50 mEq infusion 75 mL/hr at 12/29/18 0823  . fluconazole (DIFLUCAN) IV     PRN Meds:.hydrALAZINE, LORazepam Medications Prior to Admission:  Prior to Admission medications   Medication Sig Start Date End Date Taking? Authorizing Provider  aspirin EC 81 MG EC tablet Take 1 tablet (81 mg total) by mouth daily. 12/14/18  Yes Nita Sells, MD  ciprofloxacin (CIPRO) 500 MG tablet Take 500 mg by mouth every 12 (twelve) hours. 12/22/18 12/29/18 Yes [provider]  clonazePAM (KLONOPIN) 1 MG tablet Take 1 tablet (1 mg total) by mouth 2 (two) times daily. 12/13/18  Yes Nita Sells, MD  clopidogrel (PLAVIX) 75 MG tablet Take 1 tablet (75 mg total) by mouth daily. 12/14/18  Yes Nita Sells, MD  ergocalciferol (VITAMIN D2) 50000 units capsule Take 50,000 Units by mouth every Monday.    Yes [provider]  ferrous sulfate 325 (65 FE) MG tablet Take 1 tablet (325 mg total) by mouth every Monday, Wednesday, and Friday. With breakfast 12/13/18  Yes Nita Sells, MD  gabapentin (NEURONTIN) 300 MG capsule Take 1 capsule (300 mg total) by mouth 3 (three) times daily. 12/13/18  Yes Nita Sells, MD  isosorbide mononitrate (IMDUR) 60 MG 24 hr tablet Take 1 tablet (60 mg total) by mouth daily. 12/14/18  Yes Nita Sells, MD  lithium 300 MG tablet Take 1 tablet (300 mg total) by mouth daily. 12/13/18  Yes Nita Sells, MD  metoprolol tartrate (LOPRESSOR) 25 MG tablet Take 1 tablet (25 mg total) by mouth 2 (two) times daily. 12/13/18  Yes Nita Sells, MD  omeprazole (PRILOSEC) 40 MG capsule Take 1 capsule (40 mg total) by mouth daily. 02/08/18  Yes Ladene Artist, MD  rosuvastatin  (CRESTOR) 40 MG tablet Take 1 tablet (40 mg total) by mouth daily. 12/13/18  Yes Nita Sells, MD  temazepam (RESTORIL) 30 MG capsule Take 1 capsule (30 mg total) by mouth at bedtime. 12/13/18  Yes Nita Sells, MD  traMADol (ULTRAM) 50 MG tablet Take 1 tablet (50 mg total) by mouth every 6 (six) hours as needed. Patient taking differently: Take 50 mg by mouth every 6 (six) hours as needed for moderate pain.  11/29/17  Yes Marcial Pacas, MD  citalopram (CELEXA) 40 MG tablet Take 1 tablet (40 mg total) by mouth daily for 4 days. 12/13/18 12/17/18  Nita Sells, MD   No Known Allergies Review of Systems Confused but currently denies pain or SOB- "I'm fine."  Physical Exam  General: Alert, awake, in no acute distress. Frail and chronically ill appearing HEENT: No bruits, no goiter, no JVD Heart: Regular rate and rhythm. No murmur appreciated. Lungs: Good air movement, clear Abdomen: Soft, nontender, nondistended, positive bowel sounds.  Ext: No significant edema Skin: Warm and dry  Vital Signs: BP (!) 183/87 (BP Location: Left Arm)   Pulse 65   Temp 97.7 F (36.5 C) (Axillary)   Resp 17   Ht 5' 2.99" (1.6 m)   Wt 41.6 kg   SpO2 96%   BMI 16.25 kg/m  Pain Scale: 0-10   Pain Score: Asleep   SpO2: SpO2: 96 % O2 Device:SpO2: 96 % O2 Flow Rate: .   IO: Intake/output summary:   Intake/Output Summary (Last 24 hours) at 12/29/2018 1016 Last data filed at 12/29/2018 0823 Gross per 24 hour  Intake 4397.93 ml  Output -  Net 4397.93 ml    LBM: Last BM Date: 12/26/18 Baseline  Weight: Weight: 41.6 kg Most recent weight: Weight: 41.6 kg     Palliative Assessment/Data:   Flowsheet Rows     Most Recent Value  Intake Tab  Referral Department  Hospitalist  Unit at Time of Referral  Med/Surg Unit  Palliative Care Primary Diagnosis  Sepsis/Infectious Disease  Date Notified  12/28/18  Palliative Care Type  New Palliative care  Reason for referral  Clarify Goals of  Care  Date of Admission  12/24/18  Date first seen by Palliative Care  12/29/18  # of days Palliative referral response time  1 Day(s)  # of days IP prior to Palliative referral  4  Clinical Assessment  Palliative Performance Scale Score  50%  Psychosocial & Spiritual Assessment  Palliative Care Outcomes  Patient/Family meeting held?  Yes  Who was at the meeting?  Son via phone      Time In: 1750 Time Out: 1850 Time Total: 60 Greater than 50%  of this time was spent counseling and coordinating care related to the above assessment and plan.  Signed by: Micheline Rough, MD   Please contact Palliative Medicine Team phone at [phone] for questions and concerns.  For individual provider: See Shea Evans

## 2018-12-29 NOTE — Progress Notes (Signed)
Palliative care brief note  Met with son today.  He would like to speak with other family prior to making changes to code status.  Full note to follow.  Micheline Rough, MD [place] Team [phone]

## 2018-12-29 NOTE — Progress Notes (Signed)
SLP Cancellation Note  Patient Details Name: Alyssa [name] MRN: [id number] DOB: 12-Mar-1941   Cancelled treatment:       Reason Eval/Treat Not Completed: Other (comment)  Per nurse patient is lethargic after being up all night.  Nurse also reported oral holding of material and that she had to abandon medication administration.  Suggested setting up oral suction given level of the patient's cognitive based dysphagia to use as needed.  ST will follow back up next week for family education if still needed.    Shelly Flatten, MA, CCC-SLP Acute Rehab SLP [phone]  Lamar Sprinkles 12/29/2018, 11:25 AM

## 2018-12-29 NOTE — Progress Notes (Addendum)
Patient had incontinent episode, attempted to clean patient up. Removed restraints. Patient hitting staff and scratching staff. Patient trying to climb over the bedside. Tried to verbal delagate patient and calm her down. Unsuccessful. Offered patient fluids, declined. Patient stated " Get out my damn house you stupid ass prostitutes". Tried to redirect patient. Unable to keep restraints off for a long period on time due to patient hitting and swinging. Will page on call. No prn ordered for agitation. Patient desperatly needs prn for agitation. Will continue to monitor.

## 2018-12-29 NOTE — Progress Notes (Signed)
BP 187/99. Hydralazine given at 0300. EKG complete in patient's chart. Ativan ordered. Given. Patient is now calm and resting. No combativeness. Will continue to monitor.

## 2018-12-30 LAB — BASIC METABOLIC PANEL
Anion gap: 8 (ref 5–15)
Anion gap: 11 (ref 5–15)
BUN: 15 mg/dL (ref 8–23)
BUN: 16 mg/dL (ref 8–23)
CO2: 26 mmol/L (ref 22–32)
CO2: 26 mmol/L (ref 22–32)
Calcium: 8.4 mg/dL — ABNORMAL LOW (ref 8.9–10.3)
Calcium: 8.7 mg/dL — ABNORMAL LOW (ref 8.9–10.3)
Chloride: 100 mmol/L (ref 98–111)
Chloride: 98 mmol/L (ref 98–111)
Creatinine, Ser: 1.44 mg/dL — ABNORMAL HIGH (ref 0.44–1.00)
Creatinine, Ser: 1.5 mg/dL — ABNORMAL HIGH (ref 0.44–1.00)
GFR calc Af Amer: 41 mL/min — ABNORMAL LOW (ref >60)
GFR calc Af Amer: 39 mL/min — ABNORMAL LOW (ref >60)
GFR calc non Af Amer: 35 mL/min — ABNORMAL LOW (ref >60)
GFR calc non Af Amer: 34 mL/min — ABNORMAL LOW (ref >60)
Glucose, Bld: 113 mg/dL — ABNORMAL HIGH (ref 70–99)
Glucose, Bld: 122 mg/dL — ABNORMAL HIGH (ref 70–99)
Potassium: 3.8 mmol/L (ref 3.5–5.1)
Potassium: 4 mmol/L (ref 3.5–5.1)
Sodium: 134 mmol/L — ABNORMAL LOW (ref 135–145)
Sodium: 135 mmol/L (ref 135–145)

## 2018-12-30 LAB — CBC
HCT: 31.9 % — ABNORMAL LOW (ref 36.0–46.0)
Hemoglobin: 9.5 g/dL — ABNORMAL LOW (ref 12.0–15.0)
MCH: 27.5 pg (ref 26.0–34.0)
MCHC: 29.8 g/dL — ABNORMAL LOW (ref 30.0–36.0)
MCV: 92.5 fL (ref 80.0–100.0)
Platelets: 554 10*3/uL — ABNORMAL HIGH (ref 150–400)
RBC: 3.45 MIL/uL — ABNORMAL LOW (ref 3.87–5.11)
RDW: 15.9 % — ABNORMAL HIGH (ref 11.5–15.5)
WBC: 10.5 10*3/uL (ref 4.0–10.5)
nRBC: 0 % (ref 0.0–0.2)

## 2018-12-30 LAB — MAGNESIUM: Magnesium: 2.1 mg/dL (ref 1.7–2.4)

## 2018-12-30 MED ORDER — ADULT MULTIVITAMIN W/MINERALS CH
1.0000 | ORAL_TABLET | Freq: Every day | ORAL | Status: DC
  Administered 2019-01-01 – 2019-01-09 (×6): 1 via ORAL
  Filled 2018-12-30 (×7): qty 1

## 2018-12-30 MED ORDER — METOPROLOL TARTRATE 5 MG/5ML IV SOLN
5.0000 mg | Freq: Three times a day (TID) | INTRAVENOUS | Status: DC
  Administered 2018-12-30 – 2019-01-04 (×15): 5 mg via INTRAVENOUS
  Filled 2018-12-30 (×15): qty 5

## 2018-12-30 MED ORDER — VITAMIN B-1 100 MG PO TABS
100.0000 mg | ORAL_TABLET | Freq: Every day | ORAL | Status: DC
  Administered 2019-01-01 – 2019-01-09 (×8): 100 mg via ORAL
  Filled 2018-12-30 (×10): qty 1

## 2018-12-30 MED ORDER — LOPERAMIDE HCL 2 MG PO CAPS: 2.0000 mg | ORAL_CAPSULE | ORAL | Status: AC | PRN

## 2018-12-30 MED ORDER — ONDANSETRON 4 MG PO TBDP: 4.0000 mg | ORAL_TABLET | Freq: Four times a day (QID) | ORAL | Status: AC | PRN

## 2018-12-30 MED ORDER — HYDROXYZINE HCL 25 MG PO TABS
25.0000 mg | ORAL_TABLET | Freq: Four times a day (QID) | ORAL | Status: AC | PRN
  Administered 2019-01-02 (×2): 25 mg via ORAL
  Filled 2018-12-30 (×2): qty 1

## 2018-12-30 MED ORDER — LORAZEPAM 1 MG PO TABS: 1.0000 mg | ORAL_TABLET | Freq: Four times a day (QID) | ORAL | Status: AC | PRN

## 2018-12-30 MED ORDER — THIAMINE HCL 100 MG/ML IJ SOLN
100.0000 mg | Freq: Once | INTRAMUSCULAR | Status: AC
  Administered 2018-12-30: 100 mg via INTRAMUSCULAR
  Filled 2018-12-30: qty 2

## 2018-12-30 MED ORDER — LORAZEPAM 2 MG/ML IJ SOLN
1.0000 mg | Freq: Once | INTRAMUSCULAR | Status: AC
  Administered 2018-12-30: 1 mg via INTRAVENOUS
  Filled 2018-12-30: qty 1

## 2018-12-30 NOTE — Progress Notes (Signed)
PROGRESS NOTE    Alyssa [name]  HDQ:[id number] DOB: [birth year] DOA: 12/24/2018 PCP: Everardo Beals, NP    Brief Narrative:  78 year old female with history of stage IV chronic kidney disease, peripheral vascular disease status post femorofemoral bypass in 2013, chronic pain syndrome, hypertension, bipolar disorder, diabetes mellitus (new diagnosis) and ongoing tobacco use who was hospitalized recently (2 weeks back) with NSTEMI, seen by cardiology and recommended conservative management with aspirin, Plavix, statin and Imdur.  Patient was discharged to SNF on 2/26.  She was brought to the ED on 3/8 after frequent falls at the facility and increased confusion.  In the ED she was found to be agitated, confused and required a sitter for safety.  She was afebrile and vitals were stable except for mildly elevated systolic blood pressure.  Sats dropped down quickly to 78%.  Blood work showed elevated RBC of 13. 9K, elevated platelets of 602 with chest x-ray showing left basilar infiltrate and cardiomegaly.  CT of the head and cervical spine without acute findings.  EKG showed prolonged QTC. Patient admitted for acute toxic metabolic encephalopathy with acute hypoxic respiratory failure secondary to healthcare associated pneumonia and possible UTI.   Assessment & Plan:   Principal Problem:   Altered mental status Active Problems:   Failure to thrive in adult   CKD (chronic kidney disease) stage 4, GFR 15-29 ml/min ([facility])   Chronic pain syndrome   Dyspnea on exertion   Encephalopathy, toxic   Aspiration pneumonia of both lower lobes due to gastric secretions ([facility])   Acute respiratory failure with hypoxia Sec to hcap vs aspiration.  SLP eval ordered recommending dysphagia 1 diet.  She is weaned off oxygen.  IV antibiotics transitioned to oral Augmentin. CXR shows improvement in the pneumonia. Urine cultures are negative.    Acute toxic metabolic encephalopathy;   she remains  agitated and in restraints.  Psychiatry consulted and she was started on IV valproic acid BID 250 as per Dr Lorette Ang recommendations. Restoril added.   Stage 4 CKD: Creatinine at baseline.    Hypernatremia:  Improved. Probably from free water deficit. Pt has minimal oral intake.  Continue with gentle hydration.    Recent NSETMI;  Conservative management recommended by  Cardiology. Continue with aspirin and plavix.    Bipolar disorder:  Lithium discontinued . IV depakote added.   Hypokalemia and hypomagnesemia:  Replaced.   k is 4 and magnesium is 2.1   Prolonged QTC; Repeat EKG today and check QTc, improving.  Replace potassium.  Keep K >4 and mg >2.  Hypertension:  Uncontrolled. Resume imdue and metoprolol and continue with IV hydralazine.   Chronic pain syndrome: Continue with tylenol.    Diabetes mellitus:  Diet controlled.  Recently diagnosed.   Deconditioned and debility, pt continues to decline: with poor intake.  High risk for re admission . Poor prognosis.  Recommend palliative care consult. Apparently pt was DNR on last admission and she is full code now. Palliative care consulted and plan for meeting tomorrow.  PT eval recommending SNF.  Social work aware.   Anemia of chronic disease:  - patient's baseline hemoglobin is between 8 to 9.  Continue to monitor. No signs of active bleeding.     DVT prophylaxis: heparin sq Code Status: full code.  Family Communication: none at bedside.  Disposition Plan: pending clinical improvement.    Consultants:   Palliative. care  Procedures: none.  Antimicrobials:cefepime   Subjective: Continues to be agitated and in restraints.  Objective: Vitals:   12/29/18 2150 12/30/18 0227 12/30/18 0517 12/30/18 1436  BP: (!) 150/80 (!) 160/84 (!) 137/94 133/63  Pulse: 72 (!) 57 60   Resp: 18 20 20 18   Temp: 98.2 F (36.8 C)  98.7 F (37.1 C) 98.2 F (36.8 C)  TempSrc: Oral  Oral   SpO2: 97% 97% 98% 98%   Weight:      Height:        Intake/Output Summary (Last 24 hours) at 12/30/2018 1827 Last data filed at 12/30/2018 1825 Gross per 24 hour  Intake 2728.51 ml  Output 300 ml  Net 2428.51 ml   Filed Weights   12/24/18 1809  Weight: 41.6 kg    Examination:  General exam: Appears calm and comfortable , not in distress Respiratory system: Clear to auscultation. Respiratory effort normal. Cardiovascular system: S1 & S2 heard, RRR. Gastrointestinal system: Abdomen is soft non tender non distended bowel sounds good.  Central nervous system: Alert and confused. Able to move all extremities Extremities: no pedal edema.  Skin: No rashes, lesions or ulcers Psychiatry: calm today.     Data Reviewed: I have personally reviewed following labs and imaging studies  CBC: Recent Labs  Lab 12/24/18 1311 12/25/18 0921 12/28/18 0617 12/30/18 0612  WBC 13.9* 12.0* 12.7* 10.5  NEUTROABS 10.8* 9.3*  --   --   HGB 9.7* 9.2* 8.5* 9.5*  HCT 30.5* 28.9* 26.7* 31.9*  MCV 86.6 86.3 86.7 92.5  PLT 602* 602* 585* 505*   Basic Metabolic Panel: Recent Labs  Lab 12/25/18 0921 12/26/18 0546 12/27/18 0650 12/28/18 0617 12/29/18 1101 12/30/18 0237 12/30/18 0612  NA 145 146* 141 138 137 134* 135  K 4.2 5.0 4.8 3.4* 3.5 3.8 4.0  CL 115* 119* 111 107 102 100 98  CO2 19* 15* 19* 22 23 26 26   GLUCOSE 99 100* 154* 118* 142* 113* 122*  BUN 43* 34* 25* 21 15 15 16   CREATININE 1.59* 1.44* 1.35* 1.35* 1.26* 1.44* 1.50*  CALCIUM 9.2 9.5 9.0 8.5* 8.9 8.4* 8.7*  MG 2.0 1.9  --  1.7  --  2.1  --    GFR: Estimated Creatinine Clearance: 21.3 mL/min (A) (by C-G formula based on SCr of 1.5 mg/dL (H)). Liver Function Tests: Recent Labs  Lab 12/24/18 1311  AST 30  ALT 27  ALKPHOS 111  BILITOT 0.7  PROT 7.1  ALBUMIN 3.1*   No results for input(s): LIPASE, AMYLASE in the last 168 hours. Recent Labs  Lab 12/24/18 1311  AMMONIA 10   Coagulation Profile: No results for input(s): INR, PROTIME in  the last 168 hours. Cardiac Enzymes: No results for input(s): CKTOTAL, CKMB, CKMBINDEX, TROPONINI in the last 168 hours. BNP (last 3 results) No results for input(s): PROBNP in the last 8760 hours. HbA1C: No results for input(s): HGBA1C in the last 72 hours. CBG: No results for input(s): GLUCAP in the last 168 hours. Lipid Profile: No results for input(s): CHOL, HDL, LDLCALC, TRIG, CHOLHDL, LDLDIRECT in the last 72 hours. Thyroid Function Tests: No results for input(s): TSH, T4TOTAL, FREET4, T3FREE, THYROIDAB in the last 72 hours. Anemia Panel: No results for input(s): VITAMINB12, FOLATE, FERRITIN, TIBC, IRON, RETICCTPCT in the last 72 hours. Sepsis Labs: Recent Labs  Lab 12/24/18 1311  LATICACIDVEN 0.9    Recent Results (from the past 240 hour(s))  Urine culture     Status: None   Collection Time: 12/24/18  1:11 PM  Result Value Ref Range Status   Specimen Description  Final    URINE, RANDOM Performed at [facility], [place] [address], [place], [place] [address]    Special Requests   Final    NONE Performed at [facility], [place] [address], [place], [place] [address]    Culture   Final    NO GROWTH Performed at [facility], [place] [address] [place], [place] [address]    Report Status 12/25/2018 FINAL  Final  Respiratory Panel by PCR     Status: None   Collection Time: 12/24/18  4:39 PM  Result Value Ref Range Status   Adenovirus NOT DETECTED NOT DETECTED Final   Coronavirus 229E NOT DETECTED NOT DETECTED Final    Comment: (NOTE) The Coronavirus on the Respiratory Panel, DOES NOT test for the novel  Coronavirus (2019 nCoV)    Coronavirus HKU1 NOT DETECTED NOT DETECTED Final   Coronavirus NL63 NOT DETECTED NOT DETECTED Final   Coronavirus OC43 NOT DETECTED NOT DETECTED Final   Metapneumovirus NOT DETECTED NOT DETECTED Final   Rhinovirus / Enterovirus NOT DETECTED NOT DETECTED Final   Influenza A NOT DETECTED NOT DETECTED  Final   Influenza B NOT DETECTED NOT DETECTED Final   Parainfluenza Virus 1 NOT DETECTED NOT DETECTED Final   Parainfluenza Virus 2 NOT DETECTED NOT DETECTED Final   Parainfluenza Virus 3 NOT DETECTED NOT DETECTED Final   Parainfluenza Virus 4 NOT DETECTED NOT DETECTED Final   Respiratory Syncytial Virus NOT DETECTED NOT DETECTED Final   Bordetella pertussis NOT DETECTED NOT DETECTED Final   Chlamydophila pneumoniae NOT DETECTED NOT DETECTED Final   Mycoplasma pneumoniae NOT DETECTED NOT DETECTED Final    Comment: Performed at [facility], [address] [place], [place] [address]  Culture, blood (routine x 2) Call MD if unable to obtain prior to antibiotics being given     Status: None   Collection Time: 12/24/18  7:06 PM  Result Value Ref Range Status   Specimen Description   Final    BLOOD RIGHT ANTECUBITAL Performed at [place] [address], [place], [place] [address]    Special Requests   Final    BOTTLES DRAWN AEROBIC ONLY Blood Culture results may not be optimal due to an inadequate volume of blood received in culture bottles Performed at [place] [address] [place], [place] [address]    Culture   Final    NO GROWTH 5 DAYS Performed at [facility], [place] [address], [place], [place] [address]    Report Status 12/29/2018 FINAL  Final  Culture, blood (routine x 2) Call MD if unable to obtain prior to antibiotics being given     Status: None   Collection Time: 12/24/18  7:06 PM  Result Value Ref Range Status   Specimen Description   Final    BLOOD RIGHT HAND Performed at [facility], [place] [address], [place], [place] [address]    Special Requests   Final    BOTTLES DRAWN AEROBIC ONLY Blood Culture results may not be optimal due to an inadequate volume of blood received in culture bottles Performed at [place] [address], [place], [place] [address]    Culture   Final    NO  GROWTH 5 DAYS Performed at [facility], [place] [address] [place], [place] [address]    Report Status 12/29/2018 FINAL  Final  MRSA PCR Screening     Status:  None   Collection Time: 12/25/18 10:05 AM  Result Value Ref Range Status   MRSA by PCR NEGATIVE NEGATIVE Final    Comment:        The GeneXpert MRSA Assay (FDA approved for NASAL specimens only), is one component of a comprehensive MRSA colonization surveillance program. It is not intended to diagnose MRSA infection nor to guide or monitor treatment for MRSA infections. Performed at [facility], [place] [address] [place], [place] [address]          Radiology Studies: No results found.      Scheduled Meds: . amoxicillin-clavulanate  1 tablet Oral Q12H  . aspirin EC  81 mg Oral Daily  . atorvastatin  80 mg Oral q1800  . clopidogrel  75 mg Oral Daily  . ferrous sulfate  325 mg Oral Q M,W,F  . heparin  5,000 Units Subcutaneous Q8H  . hydrALAZINE  25 mg Oral Q8H  . isosorbide mononitrate  60 mg Oral Daily  . metoprolol tartrate  5 mg Intravenous Q8H  . nystatin  5 mL Oral QID  . pantoprazole  40 mg Oral Daily   Continuous Infusions: . dextrose 5 % 1,000 mL with sodium bicarbonate 50 mEq infusion 75 mL/hr at 12/30/18 0550  . valproate sodium 250 mg (12/30/18 1114)     LOS: 6 days    Time spent: 35 minutes.     Hosie Poisson, MD [facility] Pager [phone]   If 7PM-7AM, please contact night-coverage [email or web address] Password [name] 12/30/2018, 6:27 PM

## 2018-12-30 NOTE — Progress Notes (Signed)
PHARMACY NOTE:  ANTIMICROBIAL RENAL DOSAGE ADJUSTMENT  Current antimicrobial regimen includes a mismatch between antimicrobial dosage and estimated renal function.  As per policy approved by the Pharmacy & Therapeutics and Medical Executive Committees, the antimicrobial dosage will be adjusted accordingly.  Current antimicrobial dosage:  Augmentin 875 mg PO BID   Indication: PNA  Renal Function:  Estimated Creatinine Clearance: 21.3 mL/min (A) (by C-G formula based on SCr of 1.5 mg/dL (H)). []      On intermittent HD, scheduled: []      On CRRT    Antimicrobial dosage has been changed to:  Augmentin 500 mg PO BID   Additional comments:   Thank you for allowing pharmacy to be a part of this patient's care.   Royetta Asal, PharmD, BCPS Pager [phone] 12/30/2018 4:30 PM

## 2018-12-30 NOTE — Progress Notes (Signed)
[facility] on call physician notified of increasing QTC interval, reviewed medications, new orders obtained.

## 2018-12-31 MED ORDER — LORAZEPAM 2 MG/ML IJ SOLN
1.0000 mg | INTRAMUSCULAR | Status: DC | PRN
  Administered 2018-12-31 – 2019-01-04 (×5): 1 mg via INTRAVENOUS
  Filled 2018-12-31 (×4): qty 1

## 2018-12-31 NOTE — Progress Notes (Signed)
PROGRESS NOTE    Alyssa [name]  ION:[id number] DOB: 09/02/1941 DOA: 12/24/2018 PCP: Everardo Beals, NP    Brief Narrative:  78 year old female with history of stage IV chronic kidney disease, peripheral vascular disease status post femorofemoral bypass in 2013, chronic pain syndrome, hypertension, bipolar disorder, diabetes mellitus (new diagnosis) and ongoing tobacco use who was hospitalized recently (2 weeks back) with NSTEMI, seen by cardiology and recommended conservative management with aspirin, Plavix, statin and Imdur.  Patient was discharged to SNF on 2/26.  She was brought to the ED on 3/8 after frequent falls at the facility and increased confusion.  In the ED she was found to be agitated, confused and required a sitter for safety.  She was afebrile and vitals were stable except for mildly elevated systolic blood pressure.  Sats dropped down quickly to 78%.  Blood work showed elevated RBC of 13. 9K, elevated platelets of 602 with chest x-ray showing left basilar infiltrate and cardiomegaly.  CT of the head and cervical spine without acute findings.  EKG showed prolonged QTC. Patient admitted for acute toxic metabolic encephalopathy with acute hypoxic respiratory failure secondary to healthcare associated pneumonia and possible UTI.   Assessment & Plan:   Principal Problem:   Altered mental status Active Problems:   Failure to thrive in adult   CKD (chronic kidney disease) stage 4, GFR 15-29 ml/min (HCC)   Chronic pain syndrome   Dyspnea on exertion   Encephalopathy, toxic   Aspiration pneumonia of both lower lobes due to gastric secretions (HCC)   Acute respiratory failure with hypoxia Sec to hcap vs aspiration.  SLP eval ordered recommending dysphagia 1 diet.  She is weaned off oxygen.  IV antibiotics transitioned to oral Augmentin. CXR shows improvement in the pneumonia. Urine cultures are negative.    Acute toxic metabolic encephalopathy;   she remains  agitated and in restraints.  Psychiatry consulted and she was started on IV valproic acid BID 250 as per Dr Tamala Fothergill recommendations. Restoril added.   Stage 4 CKD: Creatinine at baseline.    Hypernatremia:  Improved. Probably from free water deficit. Pt has minimal oral intake.  Continue with gentle hydration.    Recent NSETMI;  Conservative management recommended by  Cardiology. Continue with aspirin and plavix.    Bipolar disorder:  Lithium discontinued . IV depakote added.   Hypokalemia and hypomagnesemia:  Replaced.   k is 4 and magnesium is 2.1   Prolonged QTC; Repeat EKG and check QTc, improving.   Replace potassium.  Keep K >4 and mg >2.  Hypertension:  Uncontrolled. Resume imdue and metoprolol and continue with IV hydralazine.   Chronic pain syndrome: Continue with tylenol.    Diabetes mellitus:  Diet controlled.  Recently diagnosed. Poor oral intake.   Deconditioned and debility, pt continues to decline: with poor intake.  High risk for re admission . Poor prognosis.  Recommend palliative care consult. Apparently pt was DNR on last admission and she is full code now. Palliative care consulted and plan for meeting today.  PT eval recommending SNF.  Social work aware.   Anemia of chronic disease:   patient's baseline hemoglobin is between 8 to 9.  Continue to monitor. No signs of active bleeding.     DVT prophylaxis: heparin sq Code Status: full code.  Family Communication: none at bedside.  Disposition Plan: pending clinical improvement.    Consultants:   Palliative. care  Procedures: none.  Antimicrobials: augmentin completed.    Subjective: Continues to be  agitated and in restraints.   Objective: Vitals:   12/30/18 0517 12/30/18 1436 12/30/18 2357 12/31/18 0655  BP: (!) 137/94 133/63 (!) 145/87 (!) 121/104  Pulse: 60  77 85  Resp: 20 18 16 18   Temp: 98.7 F (37.1 C) 98.2 F (36.8 C)  98.2 F (36.8 C)  TempSrc: Oral   Oral   SpO2: 98% 98%    Weight:      Height:        Intake/Output Summary (Last 24 hours) at 12/31/2018 1435 Last data filed at 12/31/2018 0300 Gross per 24 hour  Intake 1530.4 ml  Output 600 ml  Net 930.4 ml   Filed Weights   12/24/18 1809  Weight: 41.6 kg    Examination:  General exam: Appears calm and comfortable , not in distress Respiratory system: Clear to auscultation. Respiratory effort normal. No wheezing or rhonchi.  Cardiovascular system: S1 & S2 heard, RRR. Gastrointestinal system: Abdomen is soft non tender non distended bowel sounds good.  Central nervous system: Alert and confused. Able to move all extremities Extremities: no pedal edema.  Skin: No rashes, lesions or ulcers Psychiatry: calm today.     Data Reviewed: I have personally reviewed following labs and imaging studies  CBC: Recent Labs  Lab 12/25/18 0921 12/28/18 0617 12/30/18 0612  WBC 12.0* 12.7* 10.5  NEUTROABS 9.3*  --   --   HGB 9.2* 8.5* 9.5*  HCT 28.9* 26.7* 31.9*  MCV 86.3 86.7 92.5  PLT 602* 585* 790*   Basic Metabolic Panel: Recent Labs  Lab 12/25/18 0921 12/26/18 0546 12/27/18 0650 12/28/18 0617 12/29/18 1101 12/30/18 0237 12/30/18 0612  NA 145 146* 141 138 137 134* 135  K 4.2 5.0 4.8 3.4* 3.5 3.8 4.0  CL 115* 119* 111 107 102 100 98  CO2 19* 15* 19* 22 23 26 26   GLUCOSE 99 100* 154* 118* 142* 113* 122*  BUN 43* 34* 25* 21 15 15 16   CREATININE 1.59* 1.44* 1.35* 1.35* 1.26* 1.44* 1.50*  CALCIUM 9.2 9.5 9.0 8.5* 8.9 8.4* 8.7*  MG 2.0 1.9  --  1.7  --  2.1  --    GFR: Estimated Creatinine Clearance: 21.3 mL/min (A) (by C-G formula based on SCr of 1.5 mg/dL (H)). Liver Function Tests: No results for input(s): AST, ALT, ALKPHOS, BILITOT, PROT, ALBUMIN in the last 168 hours. No results for input(s): LIPASE, AMYLASE in the last 168 hours. No results for input(s): AMMONIA in the last 168 hours. Coagulation Profile: No results for input(s): INR, PROTIME in the last 168 hours.  Cardiac Enzymes: No results for input(s): CKTOTAL, CKMB, CKMBINDEX, TROPONINI in the last 168 hours. BNP (last 3 results) No results for input(s): PROBNP in the last 8760 hours. HbA1C: No results for input(s): HGBA1C in the last 72 hours. CBG: No results for input(s): GLUCAP in the last 168 hours. Lipid Profile: No results for input(s): CHOL, HDL, LDLCALC, TRIG, CHOLHDL, LDLDIRECT in the last 72 hours. Thyroid Function Tests: No results for input(s): TSH, T4TOTAL, FREET4, T3FREE, THYROIDAB in the last 72 hours. Anemia Panel: No results for input(s): VITAMINB12, FOLATE, FERRITIN, TIBC, IRON, RETICCTPCT in the last 72 hours. Sepsis Labs: No results for input(s): PROCALCITON, LATICACIDVEN in the last 168 hours.  Recent Results (from the past 240 hour(s))  Urine culture     Status: None   Collection Time: 12/24/18  1:11 PM  Result Value Ref Range Status   Specimen Description   Final    URINE, RANDOM Performed at  [facility], [place] [address], [place], [place] [address]    Special Requests   Final    NONE Performed at [facility], [place] [address] [place], [place] [address]    Culture   Final    NO GROWTH Performed at [facility], [place] [address], [place], [place] [address]    Report Status 12/25/2018 FINAL  Final  Respiratory Panel by PCR     Status: None   Collection Time: 12/24/18  4:39 PM  Result Value Ref Range Status   Adenovirus NOT DETECTED NOT DETECTED Final   Coronavirus 229E NOT DETECTED NOT DETECTED Final    Comment: (NOTE) The Coronavirus on the Respiratory Panel, DOES NOT test for the novel  Coronavirus (2019 nCoV)    Coronavirus HKU1 NOT DETECTED NOT DETECTED Final   Coronavirus NL63 NOT DETECTED NOT DETECTED Final   Coronavirus OC43 NOT DETECTED NOT DETECTED Final   Metapneumovirus NOT DETECTED NOT DETECTED Final   Rhinovirus / Enterovirus NOT DETECTED NOT DETECTED Final   Influenza A NOT DETECTED NOT DETECTED  Final   Influenza B NOT DETECTED NOT DETECTED Final   Parainfluenza Virus 1 NOT DETECTED NOT DETECTED Final   Parainfluenza Virus 2 NOT DETECTED NOT DETECTED Final   Parainfluenza Virus 3 NOT DETECTED NOT DETECTED Final   Parainfluenza Virus 4 NOT DETECTED NOT DETECTED Final   Respiratory Syncytial Virus NOT DETECTED NOT DETECTED Final   Bordetella pertussis NOT DETECTED NOT DETECTED Final   Chlamydophila pneumoniae NOT DETECTED NOT DETECTED Final   Mycoplasma pneumoniae NOT DETECTED NOT DETECTED Final    Comment: Performed at [facility], [address] [place], [place] [address]  Culture, blood (routine x 2) Call MD if unable to obtain prior to antibiotics being given     Status: None   Collection Time: 12/24/18  7:06 PM  Result Value Ref Range Status   Specimen Description   Final    BLOOD RIGHT ANTECUBITAL Performed at [place] [address], [place], [place] [address]    Special Requests   Final    BOTTLES DRAWN AEROBIC ONLY Blood Culture results may not be optimal due to an inadequate volume of blood received in culture bottles Performed at [place] [address], [place], [place] [address]    Culture   Final    NO GROWTH 5 DAYS Performed at [facility], [place] [address], [place], [place] [address]    Report Status 12/29/2018 FINAL  Final  Culture, blood (routine x 2) Call MD if unable to obtain prior to antibiotics being given     Status: None   Collection Time: 12/24/18  7:06 PM  Result Value Ref Range Status   Specimen Description   Final    BLOOD RIGHT HAND Performed at [facility], [place] [address], [place], [place] [address]    Special Requests   Final    BOTTLES DRAWN AEROBIC ONLY Blood Culture results may not be optimal due to an inadequate volume of blood received in culture bottles Performed at [place] [address] [place], [place] [address]    Culture   Final    NO  GROWTH 5 DAYS Performed at [facility], [place] [address], [place], [place] [address]    Report Status 12/29/2018 FINAL  Final  MRSA PCR Screening     Status: None   Collection Time: 12/25/18 10:05 AM  Result Value Ref Range Status   MRSA by PCR NEGATIVE NEGATIVE Final    Comment:        The GeneXpert MRSA Assay (FDA approved for NASAL specimens only), is one component of a comprehensive MRSA colonization surveillance program. It is not intended to diagnose MRSA infection nor to guide or monitor treatment for MRSA infections. Performed at [facility], [place] [address], [place], [place] [address]          Radiology Studies: No results found.      Scheduled Meds: . amoxicillin-clavulanate  1 tablet Oral Q12H  . aspirin EC  81 mg Oral Daily  . atorvastatin  80 mg Oral q1800  . clopidogrel  75 mg Oral Daily  . ferrous sulfate  325 mg Oral Q M,W,F  . heparin  5,000 Units Subcutaneous Q8H  . hydrALAZINE  25 mg Oral Q8H  . isosorbide mononitrate  60 mg Oral Daily  . metoprolol tartrate  5 mg Intravenous Q8H  . multivitamin with minerals  1 tablet Oral Daily  . nystatin  5 mL Oral QID  . pantoprazole  40 mg Oral Daily  . thiamine  100 mg Oral Daily   Continuous Infusions: . dextrose 5 % 1,000 mL with sodium bicarbonate 50 mEq infusion 75 mL/hr at 12/31/18 1028  . valproate sodium 250 mg (12/31/18 1031)     LOS: 7 days    Time spent: 28 minutes.     Hosie Poisson, MD [facility] Pager [phone]   If 7PM-7AM, please contact night-coverage [email or web address] Password [facility] 12/31/2018, 2:35 PM

## 2018-12-31 NOTE — Progress Notes (Signed)
Palliative Care Progress Note  Reason for consult: Goals of care in light of dementia, dysphagia, poor nutrition, and aspiration  I met today with patients family, including her sons and sisters. We discussed clinical course as well as wishes moving forward in regard to advanced directives.  Concepts specific to code status and care plan this hospitalization discussed.  We discussed difference between a aggressive medical intervention path and a palliative, comfort focused care path.  Discussed concern that her mental status is still not improving, she has not been eating enough to sustain herself, and she remains at high risk for aspiration.  We discussed that in light of multiple chronic medical problems that have worsened with this acute problem, care should be focused on interventions that are likely to allow the patient to achieve goal of getting back to home and spending time with family. I discussed with family regarding heroic interventions at the end-of-life and they agree this would not be in line with prior expressed wishes for a natural death or be likely to lead to getting well enough to go back home. They were in agreement with changing CODE STATUS to DO NOT RESUSCITATE.  Questions and concerns addressed.   PMT will continue to support holistically.  Total time: 60 minutes Greater than 50%  of this time was spent counseling and coordinating care related to the above assessment and plan.  Micheline Rough, MD [place] [facility] [phone]

## 2019-01-01 ENCOUNTER — Inpatient Hospital Stay (HOSPITAL_COMMUNITY): Payer: Medicare HMO

## 2019-01-01 LAB — BASIC METABOLIC PANEL
Anion gap: 10 (ref 5–15)
BUN: 12 mg/dL (ref 8–23)
CO2: 29 mmol/L (ref 22–32)
Calcium: 9.1 mg/dL (ref 8.9–10.3)
Chloride: 100 mmol/L (ref 98–111)
Creatinine, Ser: 1.26 mg/dL — ABNORMAL HIGH (ref 0.44–1.00)
GFR calc Af Amer: 48 mL/min — ABNORMAL LOW (ref >60)
GFR calc non Af Amer: 42 mL/min — ABNORMAL LOW (ref >60)
Glucose, Bld: 118 mg/dL — ABNORMAL HIGH (ref 70–99)
Potassium: 4.2 mmol/L (ref 3.5–5.1)
Sodium: 139 mmol/L (ref 135–145)

## 2019-01-01 NOTE — Progress Notes (Signed)
Physical Therapy Treatment Patient Details Name: Alyssa [name] MRN: [id number] DOB: [birth year] Today's Date: 01/01/2019    History of Present Illness 78 yo female admitted to ED on 3/8 with multiple falls, AMS due to suspected UTI. PMH includes anxiety, OA, chest pain with NSTEMI 12/13/18, CKD, HLD, HTN, peripheral neuropathy, PVD, chronic pain, FTT, depression, L foot drop, R LLD, back surgery, femoral bypass graft, bipolar disorder, R THA.     PT Comments    Pt in bed with B mittens and safety sitter at bedside.  Pt awake but unbale to achieve appropriate eye contact and in constant motion trying to disrobe.  Assisted OOB to [facility].  General transfer comment: unable to attempt transfer with AD due to pt's inablity to follow commands and remains in constant motion.  Assisted from EOB to [facility] 1/4 pivot  with partial stance and brief time.  Constant guard to prevent forward LOB on BSC.  When assisted off, second assist was needed for peri care.  Assisted back to bed.      Follow Up Recommendations  SNF     Equipment Recommendations  None recommended by PT    Recommendations for Other Services       Precautions / Restrictions Precautions Precautions: Fall Precaution Comments: Multiples falls in the past few weeks (advanced dementia) Restrictions Weight Bearing Restrictions: No    Mobility  Bed Mobility Overal bed mobility: Needs Assistance Bed Mobility: Rolling;Supine to Sit Rolling: Mod assist;Max assist   Supine to sit: Max assist;Total assist     General bed mobility comments: pt awake and restless.  Not following any commands.  Trying to disrobe.  Assisted to EOB, pt in constant motion.    Transfers Overall transfer level: Needs assistance Equipment used: None Transfers: Stand Pivot Transfers   Stand pivot transfers: Max assist;Total assist       General transfer comment: unable to attempt transfer with AD due to pt's inablity to follow commands and remains in  constant motion.  Assisted from EOB to [facility] 1/4 pivot  with partial stance and brief time.  Constant guard to prevent forward LOB on BSC.  When assisted off, second assist was needed for peri care.  Assisted back to bed.    Ambulation/Gait             General Gait Details: unable to attempt due to poor cognition and constant motion.     Stairs             Wheelchair Mobility    Modified Rankin (Stroke Patients Only)       Balance                                            Cognition Arousal/Alertness: Awake/alert Behavior During Therapy: Restless Overall Cognitive Status: Impaired/Different from baseline Area of Impairment: Orientation;Attention;Following commands;Safety/judgement;Problem solving                 Orientation Level: Disoriented to;Time;Place;Situation     Following Commands: (following no commands )       General Comments: pt awake and restless trying to dis robe.        Exercises      General Comments        Pertinent Vitals/Pain Pain Assessment: No/denies pain    Home Living  Prior Function            PT Goals (current goals can now be found in the care plan section) Progress towards PT goals: Progressing toward goals    Frequency    Min 2X/week      PT Plan Current plan remains appropriate    Co-evaluation              AM-PAC PT "6 Clicks" Mobility   Outcome Measure  Help needed turning from your back to your side while in a flat bed without using bedrails?: Total Help needed moving from lying on your back to sitting on the side of a flat bed without using bedrails?: Total Help needed moving to and from a bed to a chair (including a wheelchair)?: Total   Help needed to walk in hospital room?: Total Help needed climbing 3-5 steps with a railing? : Total 6 Click Score: 5    End of Session Equipment Utilized During Treatment: Gait belt Activity Tolerance:  Treatment limited secondary to agitation Patient left: in bed;with nursing/sitter in room Nurse Communication: Mobility status PT Visit Diagnosis: Other abnormalities of gait and mobility (R26.89);History of falling (Z91.81)     Time: 8295-6213 PT Time Calculation (min) (ACUTE ONLY): 13 min  Charges:  $Therapeutic Activity: 8-22 mins                     Rica Koyanagi  PTA Acute  Rehabilitation Services Pager      [phone] Office      [phone]

## 2019-01-01 NOTE — Care Management Important Message (Signed)
Important Message  Patient Details  Name: AVEREIGH SPAINHOWER MRN: [id number] Date of Birth: April 19, 1941   Medicare Important Message Given:  Yes    Kerin Salen 01/01/2019, 12:29 [place] Message  Patient Details  Name: RAENETTE SAKATA MRN: [id number] Date of Birth: 13-Mar-1941   Medicare Important Message Given:  Yes    Kerin Salen 01/01/2019, 12:29 PM

## 2019-01-01 NOTE — Progress Notes (Signed)
Attempted MRI 8887, pt unable to lat flat and still. Pt kept trying to roll on her side and we need her on her back holding still.

## 2019-01-01 NOTE — Progress Notes (Signed)
PROGRESS NOTE    Alyssa [name]  JQG:[id number] DOB: [birth year] DOA: 12/24/2018 PCP: Everardo Beals, NP    Brief Narrative:  78 year old female with history of stage IV chronic kidney disease, peripheral vascular disease status post femorofemoral bypass in 2013, chronic pain syndrome, hypertension, bipolar disorder, diabetes mellitus (new diagnosis) and ongoing tobacco use who was hospitalized recently (2 weeks back) with NSTEMI, seen by cardiology and recommended conservative management with aspirin, Plavix, statin and Imdur.  Patient was discharged to SNF on 2/26.  She was brought to the ED on 3/8 after frequent falls at the facility and increased confusion.  In the ED she was found to be agitated, confused and required a sitter for safety.  She was afebrile and vitals were stable except for mildly elevated systolic blood pressure.  Sats dropped down quickly to 78%.  Blood work showed elevated RBC of 13. 9K, elevated platelets of 602 with chest x-ray showing left basilar infiltrate and cardiomegaly.  CT of the head and cervical spine without acute findings.  EKG showed prolonged QTC. Patient admitted for acute toxic metabolic encephalopathy with acute hypoxic respiratory failure secondary to healthcare associated pneumonia and possible UTI.   Assessment & Plan:   Principal Problem:   Altered mental status Active Problems:   Failure to thrive in adult   CKD (chronic kidney disease) stage 4, GFR 15-29 ml/min (HCC)   Chronic pain syndrome   Dyspnea on exertion   Encephalopathy, toxic   Aspiration pneumonia of both lower lobes due to gastric secretions (HCC)   Acute respiratory failure with hypoxia Sec to hcap vs aspiration.  SLP eval ordered recommending dysphagia 1 diet.  She is weaned off oxygen.  Completed the course of antibiotics. CXR shows improvement in the pneumonia. Urine cultures are negative.    Acute toxic metabolic encephalopathy;   she remains agitated and  restless. Currently she is on mittens.  Psychiatry consulted and she was started on IV valproic acid BID 250 as per Dr Tamala Fothergill recommendations. Restoril added.  So far no improvement in her agitation. She has minimal oral intake and has been on dextrose fluids.  Will request psychiatry to re evaluate for medications.  Tried to do MRI brain without contrast, but she would not lie still or flat and the imaging was cancelled.   Stage 4 CKD: Creatinine at baseline.    Hypernatremia:  Improved. Probably from free water deficit. Pt has minimal oral intake.  Continue with gentle hydration.    Recent NSETMI;  Conservative management recommended by  Cardiology. Continue with aspirin and plavix.    Bipolar disorder:  Lithium discontinued . IV depakote added. But she continues to be restless , pulling at lines and gown and her mittens.   Hypokalemia and hypomagnesemia:  Replaced.   k is 4 and magnesium is 2.1   Prolonged QTC; Repeat EKG and check QTc, improving.   Replace potassium.  Keep K >4 and mg >2.  Hypertension:  Better controlled.   Resume imdue and metoprolol and continue with IV hydralazine.   Chronic pain syndrome: Continue with tylenol.    Diabetes mellitus:  Diet controlled.  Recently diagnosed. Poor oral intake.   Deconditioned and debility, pt continues to decline: with poor intake.  High risk for re admission . Poor prognosis.  Recommend palliative care consult. Apparently pt was DNR on last admission and she is full code now. Palliative care consulted and plan for meeting today.  PT eval recommending SNF.  Social work aware.  Anemia of chronic disease:   patient's baseline hemoglobin is between 8 to 9.  Continue to monitor. No signs of active bleeding.     DVT prophylaxis: heparin sq Code Status: full code.  Family Communication: none at bedside.  Disposition Plan: pending clinical improvement.    Consultants:   Palliative. care  Procedures:  none.  Antimicrobials: augmentin completed.    Subjective: Continues to be agitated and in restraints. None at bedside.  Pt is too confused to give any history.   Objective: Vitals:   12/31/18 0655 12/31/18 2056 01/01/19 0535 01/01/19 1407  BP: (!) 121/104 (!) 189/109 (!) 168/79 (!) 148/88  Pulse: 85 86 85 74  Resp: 18 19 16 16   Temp: 98.2 F (36.8 C) 98.3 F (36.8 C) 98.2 F (36.8 C) 98.1 F (36.7 C)  TempSrc: Oral Oral Axillary Axillary  SpO2:    100%  Weight:      Height:        Intake/Output Summary (Last 24 hours) at 01/01/2019 1626 Last data filed at 01/01/2019 1040 Gross per 24 hour  Intake 1239.19 ml  Output 1900 ml  Net -660.81 ml   Filed Weights   12/24/18 1809  Weight: 41.6 kg    Examination:  General exam: Appears calm and comfortable , not in distress Respiratory system: air entry fair. No wheezing heard.  Cardiovascular system: S1 & S2 heard, RRR. Gastrointestinal system: Abdomen is soft non tender non distended bowel sounds good.  Central nervous system: Alert and confused. Able to move all extremities Extremities: no pedal edema.  Skin: No rashes, lesions or ulcers Psychiatry: restless. Does nto have capacity to make decisions.     Data Reviewed: I have personally reviewed following labs and imaging studies  CBC: Recent Labs  Lab 12/28/18 0617 12/30/18 0612  WBC 12.7* 10.5  HGB 8.5* 9.5*  HCT 26.7* 31.9*  MCV 86.7 92.5  PLT 585* 354*   Basic Metabolic Panel: Recent Labs  Lab 12/26/18 0546  12/28/18 0617 12/29/18 1101 12/30/18 0237 12/30/18 0612 01/01/19 0520  NA 146*   < > 138 137 134* 135 139  K 5.0   < > 3.4* 3.5 3.8 4.0 4.2  CL 119*   < > 107 102 100 98 100  CO2 15*   < > 22 23 26 26 29   GLUCOSE 100*   < > 118* 142* 113* 122* 118*  BUN 34*   < > 21 15 15 16 12   CREATININE 1.44*   < > 1.35* 1.26* 1.44* 1.50* 1.26*  CALCIUM 9.5   < > 8.5* 8.9 8.4* 8.7* 9.1  MG 1.9  --  1.7  --  2.1  --   --    < > = values in this  interval not displayed.   GFR: Estimated Creatinine Clearance: 25.3 mL/min (A) (by C-G formula based on SCr of 1.26 mg/dL (H)). Liver Function Tests: No results for input(s): AST, ALT, ALKPHOS, BILITOT, PROT, ALBUMIN in the last 168 hours. No results for input(s): LIPASE, AMYLASE in the last 168 hours. No results for input(s): AMMONIA in the last 168 hours. Coagulation Profile: No results for input(s): INR, PROTIME in the last 168 hours. Cardiac Enzymes: No results for input(s): CKTOTAL, CKMB, CKMBINDEX, TROPONINI in the last 168 hours. BNP (last 3 results) No results for input(s): PROBNP in the last 8760 hours. HbA1C: No results for input(s): HGBA1C in the last 72 hours. CBG: No results for input(s): GLUCAP in the last 168 hours. Lipid Profile:  No results for input(s): CHOL, HDL, LDLCALC, TRIG, CHOLHDL, LDLDIRECT in the last 72 hours. Thyroid Function Tests: No results for input(s): TSH, T4TOTAL, FREET4, T3FREE, THYROIDAB in the last 72 hours. Anemia Panel: No results for input(s): VITAMINB12, FOLATE, FERRITIN, TIBC, IRON, RETICCTPCT in the last 72 hours. Sepsis Labs: No results for input(s): PROCALCITON, LATICACIDVEN in the last 168 hours.  Recent Results (from the past 240 hour(s))  Urine culture     Status: None   Collection Time: 12/24/18  1:11 PM  Result Value Ref Range Status   Specimen Description   Final    URINE, RANDOM Performed at [facility] [address] [place], [place] [address]    Special Requests   Final    NONE Performed at [facility], [place] [address], [place], [place] [address]    Culture   Final    NO GROWTH Performed at [facility], [place] [address], [place], [place] [address]    Report Status 12/25/2018 FINAL  Final  Respiratory Panel by PCR     Status: None   Collection Time: 12/24/18  4:39 PM  Result Value Ref Range Status   Adenovirus NOT DETECTED NOT DETECTED Final   Coronavirus 229E NOT  DETECTED NOT DETECTED Final    Comment: (NOTE) The Coronavirus on the Respiratory Panel, DOES NOT test for the novel  Coronavirus (2019 nCoV)    Coronavirus HKU1 NOT DETECTED NOT DETECTED Final   Coronavirus NL63 NOT DETECTED NOT DETECTED Final   Coronavirus OC43 NOT DETECTED NOT DETECTED Final   Metapneumovirus NOT DETECTED NOT DETECTED Final   Rhinovirus / Enterovirus NOT DETECTED NOT DETECTED Final   Influenza A NOT DETECTED NOT DETECTED Final   Influenza B NOT DETECTED NOT DETECTED Final   Parainfluenza Virus 1 NOT DETECTED NOT DETECTED Final   Parainfluenza Virus 2 NOT DETECTED NOT DETECTED Final   Parainfluenza Virus 3 NOT DETECTED NOT DETECTED Final   Parainfluenza Virus 4 NOT DETECTED NOT DETECTED Final   Respiratory Syncytial Virus NOT DETECTED NOT DETECTED Final   Bordetella pertussis NOT DETECTED NOT DETECTED Final   Chlamydophila pneumoniae NOT DETECTED NOT DETECTED Final   Mycoplasma pneumoniae NOT DETECTED NOT DETECTED Final    Comment: Performed at [facility], [address] [place], [place] [address]  Culture, blood (routine x 2) Call MD if unable to obtain prior to antibiotics being given     Status: None   Collection Time: 12/24/18  7:06 PM  Result Value Ref Range Status   Specimen Description   Final    BLOOD RIGHT ANTECUBITAL Performed at [facility] [address] [place], [place] [address]    Special Requests   Final    BOTTLES DRAWN AEROBIC ONLY Blood Culture results may not be optimal due to an inadequate volume of blood received in culture bottles Performed at [facility] [address], [place], [place] [address]    Culture   Final    NO GROWTH 5 DAYS Performed at [facility], [place] [address] [place],  [address]    Report Status 12/29/2018 FINAL  Final  Culture, blood (routine x 2) Call MD if unable to obtain prior to antibiotics being given     Status: None   Collection Time: 12/24/18   7:06 PM  Result Value Ref Range Status   Specimen Description   Final    BLOOD RIGHT HAND Performed at [facility], [address], [place], [place] [address]    Special Requests   Final    BOTTLES DRAWN AEROBIC ONLY Blood Culture results may not be optimal due to an inadequate volume of blood received in culture bottles Performed at [facility] [address] [place], [place] [address]    Culture   Final    NO GROWTH 5 DAYS Performed at [facility], [place] [address] [place], [place] [address]    Report Status 12/29/2018 FINAL  Final  MRSA PCR Screening     Status: None   Collection Time: 12/25/18 10:05 AM  Result Value Ref Range Status   MRSA by PCR NEGATIVE NEGATIVE Final    Comment:        The GeneXpert MRSA Assay (FDA approved for NASAL specimens only), is one component of a comprehensive MRSA colonization surveillance program. It is not intended to diagnose MRSA infection nor to guide or monitor treatment for MRSA infections. Performed at [facility], [place] [address], [place], [place] [address]          Radiology Studies: No results found.      Scheduled Meds: . aspirin EC  81 mg Oral Daily  . atorvastatin  80 mg Oral q1800  . clopidogrel  75 mg Oral Daily  . ferrous sulfate  325 mg Oral Q M,W,F  . heparin  5,000 Units Subcutaneous Q8H  . hydrALAZINE  25 mg Oral Q8H  . isosorbide mononitrate  60 mg Oral Daily  . metoprolol tartrate  5 mg Intravenous Q8H  . multivitamin with minerals  1 tablet Oral Daily  . nystatin  5 mL Oral QID  . pantoprazole  40 mg Oral Daily  . thiamine  100 mg Oral Daily   Continuous Infusions: . dextrose 5 % 1,000 mL with sodium bicarbonate 50 mEq infusion 75 mL/hr at 12/31/18 1028  . valproate sodium 250 mg (01/01/19 1053)     LOS: 8 days    Time spent: 28 minutes.     Hosie Poisson, MD [facility] Pager [phone]   If 7PM-7AM, please contact  night-coverage [email or web address] Password [facility] 01/01/2019, 4:26 PM

## 2019-01-02 DIAGNOSIS — R627 Adult failure to thrive: Secondary | ICD-10-CM

## 2019-01-02 DIAGNOSIS — Z7189 Other specified counseling: Secondary | ICD-10-CM

## 2019-01-02 DIAGNOSIS — Z515 Encounter for palliative care: Secondary | ICD-10-CM

## 2019-01-02 LAB — BASIC METABOLIC PANEL
Anion gap: 9 (ref 5–15)
BUN: 11 mg/dL (ref 8–23)
CO2: 31 mmol/L (ref 22–32)
Calcium: 8.6 mg/dL — ABNORMAL LOW (ref 8.9–10.3)
Chloride: 97 mmol/L — ABNORMAL LOW (ref 98–111)
Creatinine, Ser: 1.49 mg/dL — ABNORMAL HIGH (ref 0.44–1.00)
GFR calc Af Amer: 39 mL/min — ABNORMAL LOW (ref >60)
GFR calc non Af Amer: 34 mL/min — ABNORMAL LOW (ref >60)
Glucose, Bld: 115 mg/dL — ABNORMAL HIGH (ref 70–99)
Potassium: 3.7 mmol/L (ref 3.5–5.1)
Sodium: 137 mmol/L (ref 135–145)

## 2019-01-02 LAB — CBC
HCT: 28.7 % — ABNORMAL LOW (ref 36.0–46.0)
Hemoglobin: 9.1 g/dL — ABNORMAL LOW (ref 12.0–15.0)
MCH: 27.2 pg (ref 26.0–34.0)
MCHC: 31.7 g/dL (ref 30.0–36.0)
MCV: 85.7 fL (ref 80.0–100.0)
Platelets: 524 10*3/uL — ABNORMAL HIGH (ref 150–400)
RBC: 3.35 MIL/uL — ABNORMAL LOW (ref 3.87–5.11)
RDW: 14.9 % (ref 11.5–15.5)
WBC: 9.9 10*3/uL (ref 4.0–10.5)
nRBC: 0 % (ref 0.0–0.2)

## 2019-01-02 MED ORDER — VALPROATE SODIUM 500 MG/5ML IV SOLN
500.0000 mg | Freq: Two times a day (BID) | INTRAVENOUS | Status: DC
  Administered 2019-01-03 – 2019-01-04 (×4): 500 mg via INTRAVENOUS
  Filled 2019-01-02 (×6): qty 5

## 2019-01-02 NOTE — Progress Notes (Signed)
SLP Cancellation Note  Patient Details Name: Alyssa [name] MRN: [id number] DOB: 1941/10/08   Cancelled treatment:       Reason Eval/Treat Not Completed: Other (comment)(pt currently sleeping, will continue efforts)   Macario Golds 01/02/2019, 4:03 PM   Luanna Salk, [facility] SLP [place] Pager [phone] Office [phone]

## 2019-01-02 NOTE — Progress Notes (Signed)
PROGRESS NOTE    Alyssa [name]  FUX:[id number] DOB: October 03, 1941 DOA: 12/24/2018 PCP: Everardo Beals, NP    Brief Narrative:  78 year old female with history of stage IV chronic kidney disease, peripheral vascular disease status post femorofemoral bypass in 2013, chronic pain syndrome, hypertension, bipolar disorder, diabetes mellitus (new diagnosis) and ongoing tobacco use who was hospitalized recently (2 weeks back) with NSTEMI, seen by cardiology and recommended conservative management with aspirin, Plavix, statin and Imdur.  Patient was discharged to SNF on 2/26.  She was brought to the ED on 3/8 after frequent falls at the facility and increased confusion.  In the ED she was found to be agitated, confused and required a sitter for safety.  She was afebrile and vitals were stable except for mildly elevated systolic blood pressure.  Sats dropped down quickly to 78%.  Blood work showed elevated RBC of 13. 9K, elevated platelets of 602 with chest x-ray showing left basilar infiltrate and cardiomegaly.  CT of the head and cervical spine without acute findings.  EKG showed prolonged QTC. Patient admitted for acute toxic metabolic encephalopathy with acute hypoxic respiratory failure secondary to healthcare associated pneumonia and possible UTI.   Assessment & Plan:   Principal Problem:   Altered mental status Active Problems:   Failure to thrive in adult   CKD (chronic kidney disease) stage 4, GFR 15-29 ml/min (HCC)   Chronic pain syndrome   Dyspnea on exertion   Encephalopathy, toxic   Aspiration pneumonia of both lower lobes due to gastric secretions [facility])   Palliative care by specialist   Goals of care, counseling/discussion   Acute respiratory failure with hypoxia Resolved.  Sec to hcap vs aspiration pneumonia. SLP eval ordered recommending dysphagia 1 diet.  She is weaned off oxygen.  Completed the course of antibiotics. CXR shows improvement in the pneumonia. Urine  cultures are negative.    Acute toxic metabolic encephalopathy;   she remains agitated and restless. Currently she is on mittens.  Psychiatry consulted and she was started on IV valproic acid BID 250 as per Dr Tamala Fothergill recommendations. Restoril added.  So far no improvement in her agitation. She has minimal oral intake and has been on dextrose fluids.  Requested psychiatry to re evaluate for medications.  Tried to do MRI brain without contrast, but she would not lie still or flat and the imaging was cancelled.   Stage 4 CKD: Creatinine at baseline.    Hypernatremia:  Improved. Probably from free water deficit. Pt has minimal oral intake.  Continue with gentle hydration.    Recent NSETMI;  Conservative management recommended by  Cardiology. Continue with aspirin and plavix.    Bipolar disorder:  Lithium discontinued . IV depakote added. But she continues to be restless , pulling at lines and gown and her mittens. Will increase the depakote to 500 mg BID.   Hypokalemia and hypomagnesemia:  Replaced.   k is 4 and magnesium is 2.1   Prolonged QTC; Repeat EKG and check QTc, improving.   Replace potassium.  Keep K >4 and mg >2.  Hypertension:  Better controlled.   Resume imdue and metoprolol and continue with IV hydralazine.   Chronic pain syndrome: Continue with tylenol.    Diabetes mellitus:  Diet controlled.  Recently diagnosed. Poor oral intake.   Deconditioned and debility, pt continues to decline: with poor intake.  High risk for re admission . Poor prognosis.  Recommend palliative care consult. Apparently pt was DNR on last admission and she is  full code now. Palliative care consulted and plan for meeting today.  PT eval recommending SNF.  Social work aware.   Anemia of chronic disease:   patient's baseline hemoglobin is between 8 to 9.  Continue to monitor. No signs of active bleeding.     DVT prophylaxis: heparin sq Code Status: full code.  Family  Communication: none at bedside.  Disposition Plan: pending clinical improvement.    Consultants:   Palliative. Care  Psychiatry.   Procedures: none.  Antimicrobials: augmentin completed.    Subjective: Continues to be agitated and in restraints. None at bedside.  Pt is too confused to give any history. Minimal oral intake.   Objective: Vitals:   01/01/19 2207 01/02/19 0634 01/02/19 1318 01/02/19 1510  BP: (!) 125/102 (!) 147/96 (!) 194/109 (!) 154/86  Pulse: 77 75 67   Resp: 16 14 18    Temp: 98.6 F (37 C) 98 F (36.7 C) 98 F (36.7 C)   TempSrc: Oral Axillary Axillary   SpO2: 99% 100% 100%   Weight:      Height:        Intake/Output Summary (Last 24 hours) at 01/02/2019 1622 Last data filed at 01/02/2019 1400 Gross per 24 hour  Intake 10 ml  Output 900 ml  Net -890 ml   Filed Weights   12/24/18 1809  Weight: 41.6 kg    Examination:  General exam: Appears calm and comfortable , not in distress Respiratory system: air entry fair. No wheezing heard.  Cardiovascular system: S1 & S2 heard, RRR. Gastrointestinal system: Abdomen is soft non tender non distended bowel sounds good.  Central nervous system: Alert and confused. Able to move all extremities Extremities: no pedal edema.  Skin: No rashes, lesions or ulcers Psychiatry: restless. Does not have capacity to make decisions.     Data Reviewed: I have personally reviewed following labs and imaging studies  CBC: Recent Labs  Lab 12/28/18 0617 12/30/18 0612 01/02/19 0609  WBC 12.7* 10.5 9.9  HGB 8.5* 9.5* 9.1*  HCT 26.7* 31.9* 28.7*  MCV 86.7 92.5 85.7  PLT 585* 554* 035*   Basic Metabolic Panel: Recent Labs  Lab 12/28/18 0617 12/29/18 1101 12/30/18 0237 12/30/18 0612 01/01/19 0520 01/02/19 0609  NA 138 137 134* 135 139 137  K 3.4* 3.5 3.8 4.0 4.2 3.7  CL 107 102 100 98 100 97*  CO2 22 23 26 26 29 31   GLUCOSE 118* 142* 113* 122* 118* 115*  BUN 21 15 15 16 12 11   CREATININE 1.35* 1.26*  1.44* 1.50* 1.26* 1.49*  CALCIUM 8.5* 8.9 8.4* 8.7* 9.1 8.6*  MG 1.7  --  2.1  --   --   --    GFR: Estimated Creatinine Clearance: 21.4 mL/min (A) (by C-G formula based on SCr of 1.49 mg/dL (H)). Liver Function Tests: No results for input(s): AST, ALT, ALKPHOS, BILITOT, PROT, ALBUMIN in the last 168 hours. No results for input(s): LIPASE, AMYLASE in the last 168 hours. No results for input(s): AMMONIA in the last 168 hours. Coagulation Profile: No results for input(s): INR, PROTIME in the last 168 hours. Cardiac Enzymes: No results for input(s): CKTOTAL, CKMB, CKMBINDEX, TROPONINI in the last 168 hours. BNP (last 3 results) No results for input(s): PROBNP in the last 8760 hours. HbA1C: No results for input(s): HGBA1C in the last 72 hours. CBG: No results for input(s): GLUCAP in the last 168 hours. Lipid Profile: No results for input(s): CHOL, HDL, LDLCALC, TRIG, CHOLHDL, LDLDIRECT in the last  72 hours. Thyroid Function Tests: No results for input(s): TSH, T4TOTAL, FREET4, T3FREE, THYROIDAB in the last 72 hours. Anemia Panel: No results for input(s): VITAMINB12, FOLATE, FERRITIN, TIBC, IRON, RETICCTPCT in the last 72 hours. Sepsis Labs: No results for input(s): PROCALCITON, LATICACIDVEN in the last 168 hours.  Recent Results (from the past 240 hour(s))  Urine culture     Status: None   Collection Time: 12/24/18  1:11 PM  Result Value Ref Range Status   Specimen Description   Final    URINE, RANDOM Performed at [facility] [address], [place], [place] [address]    Special Requests   Final    NONE Performed at [facility], [place] [address] [place], [place] [address]    Culture   Final    NO GROWTH Performed at [facility], [place] [address], [place], [place] [address]    Report Status 12/25/2018 FINAL  Final  Respiratory Panel by PCR     Status: None   Collection Time: 12/24/18  4:39 PM  Result Value Ref Range Status    Adenovirus NOT DETECTED NOT DETECTED Final   Coronavirus 229E NOT DETECTED NOT DETECTED Final    Comment: (NOTE) The Coronavirus on the Respiratory Panel, DOES NOT test for the novel  Coronavirus (2019 nCoV)    Coronavirus HKU1 NOT DETECTED NOT DETECTED Final   Coronavirus NL63 NOT DETECTED NOT DETECTED Final   Coronavirus OC43 NOT DETECTED NOT DETECTED Final   Metapneumovirus NOT DETECTED NOT DETECTED Final   Rhinovirus / Enterovirus NOT DETECTED NOT DETECTED Final   Influenza A NOT DETECTED NOT DETECTED Final   Influenza B NOT DETECTED NOT DETECTED Final   Parainfluenza Virus 1 NOT DETECTED NOT DETECTED Final   Parainfluenza Virus 2 NOT DETECTED NOT DETECTED Final   Parainfluenza Virus 3 NOT DETECTED NOT DETECTED Final   Parainfluenza Virus 4 NOT DETECTED NOT DETECTED Final   Respiratory Syncytial Virus NOT DETECTED NOT DETECTED Final   Bordetella pertussis NOT DETECTED NOT DETECTED Final   Chlamydophila pneumoniae NOT DETECTED NOT DETECTED Final   Mycoplasma pneumoniae NOT DETECTED NOT DETECTED Final    Comment: Performed at [facility], [address], [place], [place] [address]  Culture, blood (routine x 2) Call MD if unable to obtain prior to antibiotics being given     Status: None   Collection Time: 12/24/18  7:06 PM  Result Value Ref Range Status   Specimen Description   Final    BLOOD RIGHT ANTECUBITAL Performed at [facility] [address], [place], [place] [address]    Special Requests   Final    BOTTLES DRAWN AEROBIC ONLY Blood Culture results may not be optimal due to an inadequate volume of blood received in culture bottles Performed at [facility] [address] [place], [place] [address]    Culture   Final    NO GROWTH 5 DAYS Performed at [facility], [place] [address] [place], [place] [address]    Report Status 12/29/2018 FINAL  Final  Culture, blood (routine x 2) Call MD if unable to obtain prior to  antibiotics being given     Status: None   Collection Time: 12/24/18  7:06 PM  Result Value Ref Range Status   Specimen Description   Final    BLOOD RIGHT HAND Performed at [facility], [place] [address], [place], [place] [address]    Special Requests  Final    BOTTLES DRAWN AEROBIC ONLY Blood Culture results may not be optimal due to an inadequate volume of blood received in culture bottles Performed at [facility], [place] [address], [place], [place] [address]    Culture   Final    NO GROWTH 5 DAYS Performed at [facility], [place] [address], [place], [place] [address]    Report Status 12/29/2018 FINAL  Final  MRSA PCR Screening     Status: None   Collection Time: 12/25/18 10:05 AM  Result Value Ref Range Status   MRSA by PCR NEGATIVE NEGATIVE Final    Comment:        The GeneXpert MRSA Assay (FDA approved for NASAL specimens only), is one component of a comprehensive MRSA colonization surveillance program. It is not intended to diagnose MRSA infection nor to guide or monitor treatment for MRSA infections. Performed at [facility], [place] [address] [place], [place] [address]          Radiology Studies: No results found.      Scheduled Meds: . aspirin EC  81 mg Oral Daily  . atorvastatin  80 mg Oral q1800  . clopidogrel  75 mg Oral Daily  . ferrous sulfate  325 mg Oral Q M,W,F  . heparin  5,000 Units Subcutaneous Q8H  . hydrALAZINE  25 mg Oral Q8H  . isosorbide mononitrate  60 mg Oral Daily  . metoprolol tartrate  5 mg Intravenous Q8H  . multivitamin with minerals  1 tablet Oral Daily  . nystatin  5 mL Oral QID  . pantoprazole  40 mg Oral Daily  . thiamine  100 mg Oral Daily   Continuous Infusions: . dextrose 5 % 1,000 mL with sodium bicarbonate 50 mEq infusion 75 mL/hr at 12/31/18 1028  . valproate sodium 250 mg (01/02/19 1037)     LOS: 9 days    Time spent: 26 minutes.     Hosie Poisson, MD [facility] Pager [phone]   If 7PM-7AM, please contact night-coverage [email or web address] Password [facility] 01/02/2019, 4:22 PM

## 2019-01-02 NOTE — Progress Notes (Signed)
Daily Progress Note   [name]       Date: 01/02/2019 DOB: 1941-04-21  Age: 78 y.o. MRN#: [id number] Attending Physician: Hosie Poisson, MD Primary Care Physician: Everardo Beals, NP Admit Date: 12/24/2018  Reason for Consultation/Follow-up: Establishing goals of care  Subjective:  patient is awake, resting in bed, safety sitter at bedside, appears calm now, but is noted to have been agitated, not following commands and trying to get out of bed earlier today.  No family at bedside, call placed and discussed with [name] see below.   Length of Stay: 9  Current Medications: Scheduled Meds:  . aspirin EC  81 mg Oral Daily  . atorvastatin  80 mg Oral q1800  . clopidogrel  75 mg Oral Daily  . ferrous sulfate  325 mg Oral Q M,W,F  . heparin  5,000 Units Subcutaneous Q8H  . hydrALAZINE  25 mg Oral Q8H  . isosorbide mononitrate  60 mg Oral Daily  . metoprolol tartrate  5 mg Intravenous Q8H  . multivitamin with minerals  1 tablet Oral Daily  . nystatin  5 mL Oral QID  . pantoprazole  40 mg Oral Daily  . thiamine  100 mg Oral Daily    Continuous Infusions: . dextrose 5 % 1,000 mL with sodium bicarbonate 50 mEq infusion 75 mL/hr at 12/31/18 1028  . valproate sodium 250 mg (01/02/19 1037)    PRN Meds: hydrALAZINE, hydrOXYzine, loperamide, LORazepam, LORazepam, ondansetron, temazepam  Physical Exam         Awake alert Complains of pain in both legs No edema Regular work of breathing S1 S2  Abdomen is not tender  Vital Signs: BP (!) 194/109 (BP Location: Left Arm)   Pulse 67   Temp 98 F (36.7 C) (Axillary)   Resp 18   Ht 5' 2.99" (1.6 m)   Wt 41.6 kg   SpO2 100%   BMI 16.25 kg/m  SpO2: SpO2: 100 % O2 Device: O2 Device: Room Air O2 Flow  Rate:    Intake/output summary:   Intake/Output Summary (Last 24 hours) at 01/02/2019 1321 Last data filed at 01/02/2019 0830 Gross per 24 hour  Intake 5 ml  Output -  Net 5 ml   LBM: Last BM Date: (PTA) Baseline Weight: Weight: 41.6 kg Most recent weight: Weight:  41.6 kg       Palliative Assessment/Data:    Flowsheet Rows     Most Recent Value  Intake Tab  Referral Department  Hospitalist  Unit at Time of Referral  Med/Surg Unit  Palliative Care Primary Diagnosis  Sepsis/Infectious Disease  Date Notified  12/28/18  Palliative Care Type  New Palliative care  Reason for referral  Clarify Goals of Care  Date of Admission  12/24/18  Date first seen by Palliative Care  12/29/18  # of days Palliative referral response time  1 Day(s)  # of days IP prior to Palliative referral  4  Clinical Assessment  Palliative Performance Scale Score  50%  Psychosocial & Spiritual Assessment  Palliative Care Outcomes  Patient/Family meeting held?  Yes  Who was at the meeting?  Son via phone      Patient Active Problem List   Diagnosis Date Noted  . Altered mental status   . Encephalopathy, toxic 12/24/2018  . Aspiration pneumonia of both lower lobes due to gastric secretions ([place]) 12/24/2018  . Non-ST elevation (NSTEMI) myocardial infarction ([place])   . Chest pain   . Dyspnea on exertion   . Elevated troponin level 12/09/2018  . Chronic migraine 03/27/2018  . Lumbar radiculopathy 03/27/2018  . Gait abnormality 03/27/2018  . CKD (chronic kidney disease) stage 4, GFR 15-29 ml/min (HCC) 12/28/2016  . Normocytic anemia 12/28/2016  . Chronic pain syndrome 12/28/2016  . Benzodiazepine withdrawal ([place]) 12/28/2016  . Benzodiazepine withdrawal without complication ([place]) 16/07/9603  . Chronic right shoulder pain 12/06/2016  . Fall 09/17/2016  . Fracture of humeral head, closed, right, initial encounter 09/17/2016  . Rib fractures 09/17/2016  . Multiple falls 09/17/2016  . Severe muscle  deconditioning 09/17/2016  . Failure to thrive in adult 09/17/2016  . Melena 04/25/2016  . Essential hypertension 04/25/2016  . Anxiety state 04/25/2016  . Tobacco abuse 04/25/2016  . Hyperlipidemia 04/25/2016  . Syncope 04/24/2016  . Syncope and collapse 04/24/2016  . Gait difficulty 01/12/2016  . Foot drop, left 01/12/2016  . Severe recurrent major depression without psychotic features ([place]) 08/28/2015  . Spondylolisthesis at L4-L5 level 07/30/2015  . PVD (peripheral vascular disease) with claudication ([place]) 07/29/2014  . Weakness-Bilateral arm/leg 07/29/2014  . Numbness-left leg 07/29/2014  . Swelling of limb-Legs 07/29/2014  . Atherosclerosis of native arteries of the extremities with intermittent claudication 09/30/2011    Palliative Care Assessment & Plan   Patient Profile:  78 year old female with history of stage IV chronic kidney disease, peripheral vascular disease status post femorofemoral bypass in 2013, chronic pain syndrome, hypertension, bipolar disorder, diabetes mellitus (new diagnosis) and ongoing tobacco use who was hospitalized recently (2 weeks back) with NSTEMI, seen by cardiology and recommended conservative management with aspirin, Plavix, statin and Imdur. Patient was discharged to SNF on 2/26. She was brought to the ED on 3/8 after frequent falls at the facility and increased confusion  Assessment:  recurrent risk of aspiration Recent NSTEMI Stage IV CKD Deconditioning, generalized weakness Poor PO intake.  Agitation.  Recommendations/Plan:  continue psych medications Continue to encourage PO Continue current mode of care Agree with DNR Call placed and discussed with [name], who stated that he is the POA, the patient lives at home with her boyfriend, she has 2 sons, grand children, she was recently in SNF rehab, she has had recent progressive ongoing decline.  Discussed frankly and compassionately with [name] about the patient's ongoing  decline. Suspect she is hospice eligible, will in fact,  need residential hospice or home with hospice, if ongoing minimal PO intake continues. [name] states that while he understands the patient's decline trajectory, he is working to have further discussions with the rest of the family.    Code Status:    Code Status Orders  (From admission, onward)         Start     Ordered   12/31/18 1359  Do not attempt resuscitation (DNR)  Continuous    Question Answer Comment  In the event of cardiac or respiratory ARREST Do not call a "code blue"   In the event of cardiac or respiratory ARREST Do not perform Intubation, CPR, defibrillation or ACLS   In the event of cardiac or respiratory ARREST Use medication by any route, position, wound care, and other measures to relive pain and suffering. May use oxygen, suction and manual treatment of airway obstruction as needed for comfort.      12/31/18 1400        Code Status History    Date Active Date Inactive Code Status Order ID Comments User Context   12/24/2018 1641 12/31/2018 1400 Full Code [id number]  Karie Kirks, DO ED   12/10/2018 1837 12/13/2018 1920 DNR [id number]  Adrian Prows, MD Inpatient   12/09/2018 1647 12/10/2018 1836 Full Code [id number]  Truett Mainland, DO Inpatient   06/23/2017 1811 06/24/2017 1915 Full Code [id number]  Doreatha Lew, MD ED   06/14/2017 1209 06/14/2017 1824 Full Code [id number]  Nigel Mormon, MD Inpatient   12/28/2016 0016 12/29/2016 2137 Full Code [id number]  Edwin Dada, MD ED   09/17/2016 2114 09/20/2016 2340 Full Code [id number]  Roney Jaffe, MD Inpatient   04/24/2016 2321 04/26/2016 1845 Full Code [id number]  Norval Morton, MD ED   08/27/2015 2101 08/30/2015 1456 Full Code [id number]  Harvel Quale, MD ED   08/26/2015 1802 08/27/2015 0345 Full Code [id number]  Voncille Lo, MD ED   08/01/2015 1727 08/04/2015 1656 Full Code [id number]  Kary Kos, MD Inpatient   07/30/2015 1848 08/01/2015 1727 Full Code  [id number]  Kary Kos, MD ED       Prognosis:   guarded. Patient with only sips/bites for PO intake.   Discharge Planning:  To Be Determined  Care plan was discussed with  Patient, safety sitter, [name] on the phone.   Thank you for allowing the Palliative Medicine Team to assist in the care of this patient.   Time In: 1000 Time Out: 1035 Total Time 35 Prolonged Time Billed  no       Greater than 50%  of this time was spent counseling and coordinating care related to the above assessment and plan.  Loistine Chance, MD [phone] Please contact Palliative Medicine Team phone at [phone] for questions and concerns.

## 2019-01-02 NOTE — Consult Note (Addendum)
[facility] Psych Consult Progress Note  01/02/2019 11:33 AM Alyssa [name]  MRN:  [id number] Subjective:   Alyssa [name] was last seen on 3/13 by the psychiatry service for medication management in the setting of multiple medical conditions. She was switched from Lithium to Depakote due to prior elevated Lithium level and being unable to tolerate PO medications. She has been intermittently refusing PO medications. She received Atarax 25 mg this morning.   On interview, Alyssa [name] reports that she is doing well. She denies problems with her sleep or appetite. She denies SI, HI or AVH. She is restless at times and attempts to get out of bed to use the restroom. She is verbally redirectable. Her husband is at bedside. He reports that she is speaking more today but is far from her baseline.   Principal Problem: Altered mental status Diagnosis:  Principal Problem:   Altered mental status Active Problems:   Failure to thrive in adult   CKD (chronic kidney disease) stage 4, GFR 15-29 ml/min (HCC)   Chronic pain syndrome   Dyspnea on exertion   Encephalopathy, toxic   Aspiration pneumonia of both lower lobes due to gastric secretions (HCC)  Total Time spent with patient: 15 minutes  Past Psychiatric History: Anxiety, bipolar disorder and MDD. Her son reports that she has never exhibited manic symptoms.  Past Medical History:  Past Medical History:  Diagnosis Date  . Anxiety   . Arthritis   . Chest pain   . Chronic kidney disease   . Elevated troponin 12/13/2018  . Headache(784.0)   . Hyperlipidemia   . Hypertension   . Joint pain   . Leg pain   . Peripheral neuropathy   . Peripheral vascular disease [facility])     Past Surgical History:  Procedure Laterality Date  . ABDOMINAL ANGIOGRAM N/A 10/29/2011   Procedure: ABDOMINAL ANGIOGRAM;  Surgeon: Elam Dutch, MD;  Location: [facility] CATH LAB;  Service: Cardiovascular;  Laterality: N/A;  . BACK SURGERY    . FEMORAL-FEMORAL BYPASS GRAFT   08/31/2010  . JOINT REPLACEMENT Right 2012   Hip  . LOWER EXTREMITY ANGIOGRAPHY  06/14/2017   Procedure: Lower Extremity Angiography;  Surgeon: Adrian Prows, MD;  Location: [facility] CV LAB;  Service: Cardiovascular;;  Bilateral limited angio performed  . RENAL ANGIOGRAPHY N/A 06/14/2017   Procedure: Renal Angiography;  Surgeon: Adrian Prows, MD;  Location: [place] CV LAB;  Service: Cardiovascular;  Laterality: N/A;  . TOTAL HIP ARTHROPLASTY     right   Family History:  Family History  Problem Relation Age of Onset  . Heart attack Mother   . Heart disease Mother   . Hyperlipidemia Mother   . Hypertension Mother   . Heart attack Father   . Heart disease Father        Before age 29  . Hyperlipidemia Father   . Hypertension Father   . Cancer Sister        brain tumor  . Aneurysm Brother        brain  . Colon cancer Neg Hx   . Liver cancer Neg Hx    Family Psychiatric  History: Denies  Social History:  Social History   Substance and Sexual Activity  Alcohol Use No     Social History   Substance and Sexual Activity  Drug Use No    Social History   Socioeconomic History  . Marital status: Single    Spouse name: Not on file  . Number of  children: 2  . Years of education: 11th  . Highest education level: Not on file  Occupational History  . Occupation: Retired  Scientific laboratory technician  . Financial resource strain: Not on file  . Food insecurity:    Worry: Not on file    Inability: Not on file  . Transportation needs:    Medical: Not on file    Non-medical: Not on file  Tobacco Use  . Smoking status: Former Smoker    Packs/day: 0.25    Types: Cigarettes    Last attempt to quit: 11/22/2018    Years since quitting: 0.1  . Smokeless tobacco: Never Used  . Tobacco comment: 5 cigs a day   Substance and Sexual Activity  . Alcohol use: No  . Drug use: No  . Sexual activity: Not on file  Lifestyle  . Physical activity:    Days per week: Not on file    Minutes per session:  Not on file  . Stress: Not on file  Relationships  . Social connections:    Talks on phone: Not on file    Gets together: Not on file    Attends religious service: Not on file    Active member of club or organization: Not on file    Attends meetings of clubs or organizations: Not on file    Relationship status: Not on file  Other Topics Concern  . Not on file  Social History Narrative    Lives at home with her brother.   Right-handed.   Occasional caffeine use.    Sleep: Good  Appetite:  Fair  Current Medications: Current Facility-Administered Medications  Medication Dose Route Frequency Provider Last Rate Last Dose  . aspirin EC tablet 81 mg  81 mg Oral Daily Swayze, Ava, DO   81 mg at 01/01/19 1058  . atorvastatin (LIPITOR) tablet 80 mg  80 mg Oral q1800 Swayze, Ava, DO   80 mg at 12/30/18 1706  . clopidogrel (PLAVIX) tablet 75 mg  75 mg Oral Daily Swayze, Ava, DO   75 mg at 01/01/19 1058  . dextrose 5 % 1,000 mL with sodium bicarbonate 50 mEq infusion   Intravenous Continuous Dhungel, Nishant, MD 75 mL/hr at 12/31/18 1028    . ferrous sulfate tablet 325 mg  325 mg Oral Q M,W,F Swayze, Ava, DO   325 mg at 01/01/19 1058  . heparin injection 5,000 Units  5,000 Units Subcutaneous Q8H Swayze, Ava, DO   5,000 Units at 01/02/19 0640  . hydrALAZINE (APRESOLINE) injection 10 mg  10 mg Intravenous Q6H PRN Dhungel, Nishant, MD   10 mg at 01/01/19 0618  . hydrALAZINE (APRESOLINE) tablet 25 mg  25 mg Oral Q8H Hosie Poisson, MD   25 mg at 12/30/18 2200  . hydrOXYzine (ATARAX/VISTARIL) tablet 25 mg  25 mg Oral Q6H PRN Hollace Hayward K, NP   25 mg at 01/02/19 1040  . isosorbide mononitrate (IMDUR) 24 hr tablet 60 mg  60 mg Oral Daily Swayze, Ava, DO   60 mg at 01/01/19 1058  . loperamide (IMODIUM) capsule 2-4 mg  2-4 mg Oral PRN Hollace Hayward K, NP      . LORazepam (ATIVAN) injection 1 mg  1 mg Intravenous Q4H PRN Micheline Rough, MD   1 mg at 12/31/18 2218  . LORazepam (ATIVAN) tablet 1 mg  1  mg Oral Q6H PRN Hollace Hayward K, NP      . metoprolol tartrate (LOPRESSOR) injection 5 mg  5 mg Intravenous Q8H  Allie Bossier, MD   5 mg at 01/02/19 0640  . multivitamin with minerals tablet 1 tablet  1 tablet Oral Daily Hollace Hayward K, NP   1 tablet at 01/01/19 1058  . nystatin (MYCOSTATIN) 100000 UNIT/ML suspension 500,000 Units  5 mL Oral QID Hosie Poisson, MD   500,000 Units at 01/02/19 0824  . ondansetron (ZOFRAN-ODT) disintegrating tablet 4 mg  4 mg Oral Q6H PRN Kyere, Belinda K, NP      . pantoprazole (PROTONIX) EC tablet 40 mg  40 mg Oral Daily Swayze, Ava, DO   40 mg at 01/01/19 1058  . temazepam (RESTORIL) capsule 15 mg  15 mg Oral QHS PRN Hosie Poisson, MD      . thiamine (VITAMIN B-1) tablet 100 mg  100 mg Oral Daily Kyere, Belinda K, NP   100 mg at 01/01/19 1058  . valproate (DEPACON) 250 mg in dextrose 5 % 50 mL IVPB  250 mg Intravenous Q12H Hosie Poisson, MD 52.5 mL/hr at 01/02/19 1037 250 mg at 01/02/19 1037    Lab Results:  Results for orders placed or performed during the hospital encounter of 12/24/18 (from the past 48 hour(s))  Basic metabolic panel     Status: Abnormal   Collection Time: 01/01/19  5:20 AM  Result Value Ref Range   Sodium 139 135 - 145 mmol/L   Potassium 4.2 3.5 - 5.1 mmol/L   Chloride 100 98 - 111 mmol/L   CO2 29 22 - 32 mmol/L   Glucose, Bld 118 (H) 70 - 99 mg/dL   BUN 12 8 - 23 mg/dL   Creatinine, Ser 1.26 (H) 0.44 - 1.00 mg/dL   Calcium 9.1 8.9 - 10.3 mg/dL   GFR calc non Af Amer 42 (L) >60 mL/min   GFR calc Af Amer 48 (L) >60 mL/min   Anion gap 10 5 - 15    Comment: Performed at [facility], [place] [address] [place], [place] [address]  CBC     Status: Abnormal   Collection Time: 01/02/19  6:09 AM  Result Value Ref Range   WBC 9.9 4.0 - 10.5 K/uL   RBC 3.35 (L) 3.87 - 5.11 MIL/uL   Hemoglobin 9.1 (L) 12.0 - 15.0 g/dL   HCT 28.7 (L) 36.0 - 46.0 %   MCV 85.7 80.0 - 100.0 fL   MCH 27.2 26.0 - 34.0 pg   MCHC 31.7 30.0 -  36.0 g/dL   RDW 14.9 11.5 - 15.5 %   Platelets 524 (H) 150 - 400 K/uL   nRBC 0.0 0.0 - 0.2 %    Comment: Performed at [facility], [place] [address] [place], [place] [address]  Basic metabolic panel     Status: Abnormal   Collection Time: 01/02/19  6:09 AM  Result Value Ref Range   Sodium 137 135 - 145 mmol/L   Potassium 3.7 3.5 - 5.1 mmol/L   Chloride 97 (L) 98 - 111 mmol/L   CO2 31 22 - 32 mmol/L   Glucose, Bld 115 (H) 70 - 99 mg/dL   BUN 11 8 - 23 mg/dL   Creatinine, Ser 1.49 (H) 0.44 - 1.00 mg/dL   Calcium 8.6 (L) 8.9 - 10.3 mg/dL   GFR calc non Af Amer 34 (L) >60 mL/min   GFR calc Af Amer 39 (L) >60 mL/min   Anion gap 9 5 - 15    Comment: Performed at [facility], [place] [address] [place], [place]  [address]    Blood Alcohol level:  Lab Results  Component Value Date   [facility] <5 08/27/2015   ETH <5 08/26/2015     Musculoskeletal: Strength & Muscle Tone: within normal limits Gait & Station: UTA since patient is lying in bed. Patient leans: N/A  Psychiatric Specialty Exam: Physical Exam  Nursing note and vitals reviewed. Constitutional: She appears well-developed and well-nourished.  HENT:  Head: Normocephalic and atraumatic.  Neck: Normal range of motion.  Respiratory: Effort normal.  Musculoskeletal: Normal range of motion.  Neurological: She is alert.  Only oriented to person.   Psychiatric: She has a normal mood and affect. Her speech is normal and behavior is normal. Thought content normal. Cognition and memory are impaired. She expresses impulsivity.    Review of Systems  Cardiovascular: Negative for chest pain.  Gastrointestinal: Negative for abdominal pain.  Psychiatric/Behavioral: Negative for hallucinations and suicidal ideas.  All other systems reviewed and are negative.   Blood pressure (!) 147/96, pulse 75, temperature 98 F (36.7 C), temperature source Axillary, resp. rate 14, height 5' 2.99" (1.6 m), weight 41.6  kg, SpO2 100 %.Body mass index is 16.25 kg/m.  General Appearance: Fairly Groomed, elderly, Caucasian female who is wearing a hospital gown with hand mitts and lying in bed under the covers. NAD.   Eye Contact:  Good  Speech:  Clear and Coherent and Normal Rate  Volume:  Normal  Mood:  Euthymic  Affect:  Appropriate  Thought Process:  Linear and Descriptions of Associations: Intact  Orientation:  Other:  Only oriented to self.  Thought Content:  Logical  Suicidal Thoughts:  No  Homicidal Thoughts:  No  Memory:  Immediate;   Poor Recent;   Fair Remote;   Fair  Judgement:  Impaired  Insight:  Lacking  Psychomotor Activity:  Restlessness at times and trying to get out of bed.  Concentration:  Concentration: Fair and Attention Span: Fair  Recall:  Poor  Fund of Knowledge:  Poor  Language:  Good  Akathisia:  No  Handed:  Right  AIMS (if indicated):   N/A  Assets:  Agricultural consultant Housing Intimacy Social Support  ADL's:  Impaired  Cognition: Impaired due to altered mental status.   Sleep:   Okay   Assessment:  Alyssa [name] is a 78 y.o. female who was admitted with acute toxic metabolic encephalopathy in the setting of acute hypoxic respiratory failure secondary to HCAP and possible UTI. She is only oriented to person. She denies SI, HI or AVH. She is appropriate and calm although restless at times during interview. She is verbally redirectable. Recommend following standard delirium protocol to decrease worsening mental status. Can consider increasing Depakote if worsening agitation.     Treatment Plan Summary: -Continue Depakote 250 mg BID. Can consider increasing nighttime dose to 500 mg qhs if worsening agitation. Please obtain Depakote level.  -EKG reviewed and QTc 544 on 3/14. Please closely monitor when starting or increasing QTc prolonging agents.  -Psychiatry will sign off on patient at this time. Please consult psychiatry again as  needed.   Delirium Management:  Standard delirium management (adapted from NICE guidelines 2011 for prevention of delirium):   Provide continuity of care when possible [avoid frequent changing of surroundings (especially room) and staff].  Frequent reorientation to time with:  A clock should always be visible.  Make sure Calendar/white board is updated.  Lights on/blinds open during the day and off/closed at night.  Encourage frequent family visits.  Monitor for and treat dehydration/constipation.  Optimize oxygen saturation.  Avoid catheters and IV's when possible and look for/treat infections.  Encourage early mobility.  Avoid restraints when possible.  Assess and treat pain.  Ensure adequate nutrition and functioning dentures.  Address reversible causes of hearing and visual impairment:  Use pocket talker if hearing aids are unavailable.  Avoid sleep disturbance (normalize sleep/wake cycle).  Minimize disturbances and consider NOT obtaining vitals at night if possible.   Lights on/blinds open during the day and off/closed at night, place patient near the window.  Review Medications to avoid polypharmacy and avoid deliriogenic medications when possible:  Benzodiazepines.  Dihydropyridines.  Antihistamines.  Anticholinergics.  (Possibly avoid: H2 blockers, tricyclic antidepressants, antiparkinson medications, steroids, NSAID's).  Please remember: Signs of delirium may persist for 12 months or longer, particularly in those with underlying dementia.  The course of delirium in older medical inpatients: a prospective study. [facility] Intern Med. 2003;18(9):696. (Article)    Faythe Dingwall, DO 01/02/2019, 11:33 AM

## 2019-01-02 NOTE — Progress Notes (Signed)
Palliative Care Progress Note  Reason for consult: Goals of care in light of dementia, dysphagia, poor nutrition, and aspiration  I saw and examined Alyssa [name] today.  She was sleeping on exam.    Granddaughter and significant other at the bedside.  Expressed continued concern that she is confused, not eating, and possibly aspirating with intake.  Her sons who are decision makers Alyssa [name] is reported HCPOA) are not at the bedside today.  Questions and concerns addressed.   PMT will continue to support holistically.  Total time: 20 minutes Greater than 50%  of this time was spent counseling and coordinating care related to the above assessment and plan.  Micheline Rough, MD [place] Team [phone]

## 2019-01-03 DIAGNOSIS — N184 Chronic kidney disease, stage 4 (severe): Secondary | ICD-10-CM

## 2019-01-03 MED ORDER — QUETIAPINE FUMARATE 25 MG PO TABS
12.5000 mg | ORAL_TABLET | Freq: Two times a day (BID) | ORAL | Status: DC
  Administered 2019-01-03 – 2019-01-05 (×5): 12.5 mg via ORAL
  Filled 2019-01-03 (×6): qty 1

## 2019-01-03 NOTE — TOC Initial Note (Signed)
Transition of Care [facility]) - Initial/Assessment Note    Patient Details  Name: Alyssa [name] MRN: [id number] Date of Birth: 1941/10/14  [facility]) CM/SW Contact:    Leeroy Cha, RN Phone Number: 01/03/2019, 4:27 PM  Clinical Narrative:                 Discharge Readiness Return to top of Delirium RRG - [place]  Discharge readiness is indicated by patient meeting Recovery Milestones, including ALL of the following: ? Delirium resolved or manageable[R] NOI ? No dangerous behavior[S] for at least 24 hours YES ? Thoughts of suicide or Harm absent or manageable at lower level of care N/A ? Medical comorbidities, adverse medication events, and substance use absent or treatable at lower level of care NOT AT THIS TIME ? Functional status impairment absent or adequate self-care support planned at lower level of care NO SNF LEVEL CARE ? Patient can participate (eg, verify absence of plan for harm) in needed monitoring NO CONFUSED ? Provider and supports sufficiently available at lower level of care NOT AST THIS TIME ? CONFUSED POSSIBLITY OF UTI SOURCE PENDING.   Expected Discharge Plan: Skilled Nursing Facility Barriers to Discharge: No Barriers Identified   Patient Goals and CMS Choice Patient states their goals for this hospitalization and ongoing recovery are:: Unable to state. Confused      Expected Discharge Plan and Services Expected Discharge Plan: [place] Discharge Planning Services: CM Consult   Living arrangements for the past 2 months: [place]                          Prior Living Arrangements/Services Living arrangements for the past 2 months: [place] Lives with:: Facility Resident Patient language and need for interpreter reviewed:: No Do you feel safe going back to the place where you live?: No(SNF RESIDENT AND CONFUSED)   SEE BELOW  Need for Family Participation in Patient Care: Yes  (Comment) Care giver support system in place?: Yes (comment)   Criminal Activity/Legal Involvement Pertinent to Current Situation/Hospitalization: No - Comment as needed  Activities of Daily Living Home Assistive Devices/Equipment: Walker (specify type), Cane (specify quad or straight) ADL Screening (condition at time of admission) Patient's cognitive ability adequate to safely complete daily activities?: No Is the patient deaf or have difficulty hearing?: Yes Does the patient have difficulty seeing, even when wearing glasses/contacts?: No Does the patient have difficulty concentrating, remembering, or making decisions?: No Patient able to express need for assistance with ADLs?: Yes Does the patient have difficulty dressing or bathing?: No Independently performs ADLs?: Yes (appropriate for developmental age) Does the patient have difficulty walking or climbing stairs?: Yes Weakness of Legs: Both Weakness of Arms/Hands: None  Permission Sought/Granted Permission sought to share information with : Other (comment)(No need for permission at this time)                Emotional Assessment Appearance:: Appears stated age Attitude/Demeanor/Rapport: Uncooperative Affect (typically observed): Restless Orientation: : Oriented to Self Alcohol / Substance Use: Never Used Psych Involvement: No (comment)  Admission diagnosis:  Lower urinary tract infectious disease [N39.0] Fall, initial encounter [W19.XXXA] Altered mental status, unspecified altered mental status type [R41.82] Aspiration pneumonia of right lower lobe, unspecified aspiration pneumonia type [facility]) [J69.0] Patient Active Problem List   Diagnosis Date Noted  . Palliative care by specialist   . Goals of care, counseling/discussion   . Altered mental status   .  Encephalopathy, toxic 12/24/2018  . Aspiration pneumonia of both lower lobes due to gastric secretions ([place]) 12/24/2018  . Non-ST elevation (NSTEMI) myocardial  infarction ([place])   . Chest pain   . Dyspnea on exertion   . Elevated troponin level 12/09/2018  . Chronic migraine 03/27/2018  . Lumbar radiculopathy 03/27/2018  . Gait abnormality 03/27/2018  . CKD (chronic kidney disease) stage 4, GFR 15-29 ml/min (HCC) 12/28/2016  . Normocytic anemia 12/28/2016  . Chronic pain syndrome 12/28/2016  . Benzodiazepine withdrawal ([place]) 12/28/2016  . Benzodiazepine withdrawal without complication ([place]) 61/95/0932  . Chronic right shoulder pain 12/06/2016  . Fall 09/17/2016  . Fracture of humeral head, closed, right, initial encounter 09/17/2016  . Rib fractures 09/17/2016  . Multiple falls 09/17/2016  . Severe muscle deconditioning 09/17/2016  . Failure to thrive in adult 09/17/2016  . Melena 04/25/2016  . Essential hypertension 04/25/2016  . Anxiety state 04/25/2016  . Tobacco abuse 04/25/2016  . Hyperlipidemia 04/25/2016  . Syncope 04/24/2016  . Syncope and collapse 04/24/2016  . Gait difficulty 01/12/2016  . Foot drop, left 01/12/2016  . Severe recurrent major depression without psychotic features ([place]) 08/28/2015  . Spondylolisthesis at L4-L5 level 07/30/2015  . PVD (peripheral vascular disease) with claudication ([place]) 07/29/2014  . Weakness-Bilateral arm/leg 07/29/2014  . Numbness-left leg 07/29/2014  . Swelling of limb-Legs 07/29/2014  . Atherosclerosis of native arteries of the extremities with intermittent claudication 09/30/2011   PCP:  Everardo Beals, NP Pharmacy:   [facility], [place] [address] Phone: [phone] Fax: [phone]     Social Determinants of Health (SDOH) Interventions    Readmission Risk Interventions 30 Day Unplanned Readmission Risk Score     ED to Hosp-Admission (Current) from 12/24/2018 in [place] [facility]ICAL UNIT  30 Day Unplanned Readmission Risk Score (%)   35 Filed at 01/03/2019 1600     This score is the patient's risk of an unplanned readmission within 30 days of being discharged (0 -100%). The score is based on dignosis, age, lab data, medications, orders, and past utilization.   Low:  0-14.9   Medium: 15-21.9   High: 22-29.9   Extreme: 30 and above       Readmission Risk Prevention Plan 12/26/2018  Transportation Screening Complete  Medication Review Press photographer) Complete  PCP or Specialist appointment within 3-5 days of discharge Not Complete  PCP/Specialist Appt Not Complete comments pt for snf. Snf MD to see as needed.  [place] or Home Care Consult Not Complete  HRI or Home Care Consult Pt Refusal Comments NA  SW Recovery Care/Counseling Consult Not Complete  SW Consult Not Complete Comments NA  Palliative Care Screening Complete  Skilled Nursing Facility Complete  Some recent data might be hidden

## 2019-01-03 NOTE — Progress Notes (Signed)
PROGRESS NOTE  Alyssa [name] UDJ:[id number] DOB: 1941/01/12 DOA: 12/24/2018 PCP: Everardo Beals, NP   LOS: 10 days   Brief Narrative / Interim history: 78 year old female with history of stage IV chronic kidney disease, peripheral vascular disease status post femorofemoral bypass in 2013, chronic pain syndrome, hypertension, bipolar disorder, diabetes mellitus (new diagnosis) and ongoing tobacco use who was hospitalized recently (2 weeks back) with NSTEMI, seen by cardiology and recommended conservative management with aspirin, Plavix, statin and Imdur. Patient was discharged to SNF on 2/26. She was brought to the ED on 3/8 after frequent falls at the facility and increased confusion. In the ED she was found to be agitated, confused and required a sitter for safety. She was afebrile and vitals were stable except for mildly elevated systolic blood pressure. Sats dropped down quickly to 78%. Blood work showed elevated RBC of 13. 9K, elevated platelets of 602 with chest x-ray showing left basilar infiltrate and cardiomegaly. CT of the head and cervical spine without acute findings. EKG showed prolonged QTC. Patient admitted for acute toxic metabolic encephalopathy with acute hypoxic respiratory failure secondary to healthcare associated pneumonia and possible UTI.  Subjective: Sleeping, confused  Assessment & Plan: Principal Problem:   Altered mental status Active Problems:   Failure to thrive in adult   CKD (chronic kidney disease) stage 4, GFR 15-29 ml/min (HCC)   Chronic pain syndrome   Dyspnea on exertion   Encephalopathy, toxic   Aspiration pneumonia of both lower lobes due to gastric secretions (HCC)   Palliative care by specialist   Goals of care, counseling/discussion   Principal Problem Acute hypoxic respiratory failure due to H CAP versus aspiration pneumonia -Patient was placed on antibiotics and finished a course, currently off -On room air this morning,  clinically stable -Repeat chest x-ray showed improvement in pneumonia, all cultures remained negative  Active Problems Acute toxic metabolic encephalopathy -Remains agitated and restless, sitter was in the room this morning.  Continue valproic acid as well as Restoril -Continue supportive treatment  Stage IV chronic kidney disease -Creatinine at baseline  Hypernatremia -Likely from free water deficits this patient has minimal oral intake, improved  Non-STEMI -Continue aspirin Plavix, conservative management recommended by cardiology  Bipolar disorder -Continue IV Depakote  Hypokalemia and hypomagnesemia -Replete, now stable  Hypertension -Controlled  Hyperlipidemia -Continue statin  Anemia of chronic kidney disease -Hemoglobin stable  Scheduled Meds: . aspirin EC  81 mg Oral Daily  . atorvastatin  80 mg Oral q1800  . clopidogrel  75 mg Oral Daily  . ferrous sulfate  325 mg Oral Q M,W,F  . heparin  5,000 Units Subcutaneous Q8H  . hydrALAZINE  25 mg Oral Q8H  . isosorbide mononitrate  60 mg Oral Daily  . metoprolol tartrate  5 mg Intravenous Q8H  . multivitamin with minerals  1 tablet Oral Daily  . nystatin  5 mL Oral QID  . pantoprazole  40 mg Oral Daily  . thiamine  100 mg Oral Daily   Continuous Infusions: . dextrose 5 % 1,000 mL with sodium bicarbonate 50 mEq infusion 75 mL/hr at 01/03/19 0404  . valproate sodium 500 mg (01/03/19 1047)   PRN Meds:.hydrALAZINE, LORazepam, temazepam  DVT prophylaxis: heparin Code Status: DNR Family Communication: No family present at bedside Disposition Plan: To be determined, SNF ready  Consultants:   Psychiatry  Procedures:   None   Antimicrobials:  None    Objective: Vitals:   01/02/19 1318 01/02/19 1510 01/02/19 2007 01/03/19 0601  BP: Marland Kitchen)  194/109 (!) 154/86 (!) 163/94 (!) 144/95  Pulse: 67  66 74  Resp: 18  19 18   Temp: 98 F (36.7 C)  99.1 F (37.3 C)   TempSrc: Axillary  Oral   SpO2: 100%  98% 96%   Weight:      Height:        Intake/Output Summary (Last 24 hours) at 01/03/2019 1203 Last data filed at 01/03/2019 1147 Gross per 24 hour  Intake 245 ml  Output 2400 ml  Net -2155 ml   Filed Weights   12/24/18 1809  Weight: 41.6 kg    Examination:  Constitutional: NAD Eyes: PERRL, lids and conjunctivae normal ENMT: Mucous membranes are moist.  Respiratory: clear to auscultation bilaterally, no wheezing, no crackles. Cardiovascular: Regular rate and rhythm, no murmurs / rubs / gallops. No LE edema.  Abdomen: no tenderness. Bowel sounds positive.  Skin: no rashes Neurologic: non focal but not following commands consistently   Data Reviewed: I have independently reviewed following labs and imaging studies   CBC: Recent Labs  Lab 12/28/18 0617 12/30/18 0612 01/02/19 0609  WBC 12.7* 10.5 9.9  HGB 8.5* 9.5* 9.1*  HCT 26.7* 31.9* 28.7*  MCV 86.7 92.5 85.7  PLT 585* 554* 782*   Basic Metabolic Panel: Recent Labs  Lab 12/28/18 0617 12/29/18 1101 12/30/18 0237 12/30/18 0612 01/01/19 0520 01/02/19 0609  NA 138 137 134* 135 139 137  K 3.4* 3.5 3.8 4.0 4.2 3.7  CL 107 102 100 98 100 97*  CO2 22 23 26 26 29 31   GLUCOSE 118* 142* 113* 122* 118* 115*  BUN 21 15 15 16 12 11   CREATININE 1.35* 1.26* 1.44* 1.50* 1.26* 1.49*  CALCIUM 8.5* 8.9 8.4* 8.7* 9.1 8.6*  MG 1.7  --  2.1  --   --   --    GFR: Estimated Creatinine Clearance: 21.4 mL/min (A) (by C-G formula based on SCr of 1.49 mg/dL (H)). Liver Function Tests: No results for input(s): AST, ALT, ALKPHOS, BILITOT, PROT, ALBUMIN in the last 168 hours. No results for input(s): LIPASE, AMYLASE in the last 168 hours. No results for input(s): AMMONIA in the last 168 hours. Coagulation Profile: No results for input(s): INR, PROTIME in the last 168 hours. Cardiac Enzymes: No results for input(s): CKTOTAL, CKMB, CKMBINDEX, TROPONINI in the last 168 hours. BNP (last 3 results) No results for input(s): PROBNP in the last  8760 hours. HbA1C: No results for input(s): HGBA1C in the last 72 hours. CBG: No results for input(s): GLUCAP in the last 168 hours. Lipid Profile: No results for input(s): CHOL, HDL, LDLCALC, TRIG, CHOLHDL, LDLDIRECT in the last 72 hours. Thyroid Function Tests: No results for input(s): TSH, T4TOTAL, FREET4, T3FREE, THYROIDAB in the last 72 hours. Anemia Panel: No results for input(s): VITAMINB12, FOLATE, FERRITIN, TIBC, IRON, RETICCTPCT in the last 72 hours. Urine analysis:    Component Value Date/Time   COLORURINE YELLOW 12/24/2018 1311   APPEARANCEUR HAZY (A) 12/24/2018 1311   LABSPEC 1.011 12/24/2018 1311   PHURINE 6.0 12/24/2018 1311   GLUCOSEU NEGATIVE 12/24/2018 1311   HGBUR SMALL (A) 12/24/2018 1311   BILIRUBINUR NEGATIVE 12/24/2018 1311   KETONESUR NEGATIVE 12/24/2018 1311   PROTEINUR 100 (A) 12/24/2018 1311   UROBILINOGEN 0.2 08/24/2015 1737   NITRITE NEGATIVE 12/24/2018 1311   LEUKOCYTESUR MODERATE (A) 12/24/2018 1311   Sepsis Labs: Invalid input(s): PROCALCITONIN, LACTICIDVEN  Recent Results (from the past 240 hour(s))  Urine culture     Status: None   Collection  Time: 12/24/18  1:11 PM  Result Value Ref Range Status   Specimen Description   Final    URINE, RANDOM Performed at [facility] [address], [place], [place] [address]    Special Requests   Final    NONE Performed at [facility], [place] [address], [place], [place] [address]    Culture   Final    NO GROWTH Performed at [facility], [place] [address], [place], [place] [address]    Report Status 12/25/2018 FINAL  Final  Respiratory Panel by PCR     Status: None   Collection Time: 12/24/18  4:39 PM  Result Value Ref Range Status   Adenovirus NOT DETECTED NOT DETECTED Final   Coronavirus 229E NOT DETECTED NOT DETECTED Final    Comment: (NOTE) The Coronavirus on the Respiratory Panel, DOES NOT test for the novel  Coronavirus (2019 nCoV)     Coronavirus HKU1 NOT DETECTED NOT DETECTED Final   Coronavirus NL63 NOT DETECTED NOT DETECTED Final   Coronavirus OC43 NOT DETECTED NOT DETECTED Final   Metapneumovirus NOT DETECTED NOT DETECTED Final   Rhinovirus / Enterovirus NOT DETECTED NOT DETECTED Final   Influenza A NOT DETECTED NOT DETECTED Final   Influenza B NOT DETECTED NOT DETECTED Final   Parainfluenza Virus 1 NOT DETECTED NOT DETECTED Final   Parainfluenza Virus 2 NOT DETECTED NOT DETECTED Final   Parainfluenza Virus 3 NOT DETECTED NOT DETECTED Final   Parainfluenza Virus 4 NOT DETECTED NOT DETECTED Final   Respiratory Syncytial Virus NOT DETECTED NOT DETECTED Final   Bordetella pertussis NOT DETECTED NOT DETECTED Final   Chlamydophila pneumoniae NOT DETECTED NOT DETECTED Final   Mycoplasma pneumoniae NOT DETECTED NOT DETECTED Final    Comment: Performed at [facility], [address] [place], [place] [address]  Culture, blood (routine x 2) Call MD if unable to obtain prior to antibiotics being given     Status: None   Collection Time: 12/24/18  7:06 PM  Result Value Ref Range Status   Specimen Description   Final    BLOOD RIGHT ANTECUBITAL Performed at [facility] [address], [place], [place] [address]    Special Requests   Final    BOTTLES DRAWN AEROBIC ONLY Blood Culture results may not be optimal due to an inadequate volume of blood received in culture bottles Performed at [facility] [address] [place], [place] [address]    Culture   Final    NO GROWTH 5 DAYS Performed at [facility], [place] [address], [place], [place] [address]    Report Status 12/29/2018 FINAL  Final  Culture, blood (routine x 2) Call MD if unable to obtain prior to antibiotics being given     Status: None   Collection Time: 12/24/18  7:06 PM  Result Value Ref Range Status   Specimen Description   Final    BLOOD RIGHT HAND Performed at [facility], [place] [address],  [place], [place] [address]    Special Requests   Final    BOTTLES DRAWN AEROBIC ONLY Blood Culture results may not be optimal due to an inadequate volume of blood received in culture bottles Performed at [facility] [address], [place], [place] [address]    Culture   Final    NO GROWTH 5 DAYS Performed at [facility], [place] [address], [place], [place] [address]  Report Status 12/29/2018 FINAL  Final  MRSA PCR Screening     Status: None   Collection Time: 12/25/18 10:05 AM  Result Value Ref Range Status   MRSA by PCR NEGATIVE NEGATIVE Final    Comment:        The GeneXpert MRSA Assay (FDA approved for NASAL specimens only), is one component of a comprehensive MRSA colonization surveillance program. It is not intended to diagnose MRSA infection nor to guide or monitor treatment for MRSA infections. Performed at [facility], [place] [address] [place], [place] [address]       Radiology Studies: No results found.   Marzetta Board, MD, PhD [facility]  Contact via  [email or web address]  [place] P: [phone]  F: [phone]

## 2019-01-03 NOTE — Progress Notes (Signed)
PT Cancellation Note  Patient Details Name: Alyssa [name] MRN: [id number] DOB: [birth year]   Cancelled Treatment:    Reason Eval/Treat Not Completed: Fatigue/lethargy limiting ability to participate Sitter reports pt just up to [facility] with nursing and received Ativan short after.  Pt finally calm and sleeping.  Will check back as schedule permits.   Emmalena Canny,KATHrine E 01/03/2019, 2:48 PM Carmelia Bake, PT, DPT Acute Rehabilitation Services Office: [phone] Pager: [phone]

## 2019-01-04 DIAGNOSIS — G894 Chronic pain syndrome: Secondary | ICD-10-CM

## 2019-01-04 MED ORDER — LORAZEPAM 2 MG/ML IJ SOLN: 0.5000 mg | Freq: Four times a day (QID) | INTRAMUSCULAR | Status: DC | PRN

## 2019-01-04 NOTE — TOC Initial Note (Signed)
Transition of Care [facility]) - Initial/Assessment Note    Patient Details  Name: Alyssa [name] MRN: [id number] Date of Birth: 1941/04/06  [facility]) CM/SW Contact:    Servando Snare, LCSW Phone Number: 01/04/2019, 10:37 AM  Clinical Narrative:  Patient admitted from facility for rehab. Patient has family support. Patient will return to SNF for rehab at dc. Patient has had sitter in hospital. Patient must be sitter free for 24 hours before she can dc to facility.                  Expected Discharge Plan: Skilled Nursing Facility Barriers to Discharge: No Barriers Identified   Patient Goals and CMS Choice Patient states their goals for this hospitalization and ongoing recovery are:: Unable to state. Confused      Expected Discharge Plan and Services Expected Discharge Plan: [place] Discharge Planning Services: CM Consult   Living arrangements for the past 2 months: [place]                          Prior Living Arrangements/Services Living arrangements for the past 2 months: [place] Lives with:: Facility Resident Patient language and need for interpreter reviewed:: No Do you feel safe going back to the place where you live?: No(SNF RESIDENT AND CONFUSED)   SEE BELOW  Need for Family Participation in Patient Care: Yes (Comment) Care giver support system in place?: Yes (comment)   Criminal Activity/Legal Involvement Pertinent to Current Situation/Hospitalization: No - Comment as needed  Activities of Daily Living Home Assistive Devices/Equipment: Walker (specify type), Cane (specify quad or straight) ADL Screening (condition at time of admission) Patient's cognitive ability adequate to safely complete daily activities?: No Is the patient deaf or have difficulty hearing?: Yes Does the patient have difficulty seeing, even when wearing glasses/contacts?: No Does the patient have difficulty concentrating,  remembering, or making decisions?: No Patient able to express need for assistance with ADLs?: Yes Does the patient have difficulty dressing or bathing?: No Independently performs ADLs?: Yes (appropriate for developmental age) Does the patient have difficulty walking or climbing stairs?: Yes Weakness of Legs: Both Weakness of Arms/Hands: None  Permission Sought/Granted Permission sought to share information with : Other (comment)(No need for permission at this time)                Emotional Assessment Appearance:: Appears stated age Attitude/Demeanor/Rapport: Uncooperative Affect (typically observed): Restless Orientation: : Oriented to Self Alcohol / Substance Use: Never Used Psych Involvement: No (comment)  Admission diagnosis:  Lower urinary tract infectious disease [N39.0] Fall, initial encounter [W19.XXXA] Altered mental status, unspecified altered mental status type [R41.82] Aspiration pneumonia of right lower lobe, unspecified aspiration pneumonia type [facility]) [J69.0] Patient Active Problem List   Diagnosis Date Noted  . Palliative care by specialist   . Goals of care, counseling/discussion   . Altered mental status   . Encephalopathy, toxic 12/24/2018  . Aspiration pneumonia of both lower lobes due to gastric secretions ([place]) 12/24/2018  . Non-ST elevation (NSTEMI) myocardial infarction ([place])   . Chest pain   . Dyspnea on exertion   . Elevated troponin level 12/09/2018  . Chronic migraine 03/27/2018  . Lumbar radiculopathy 03/27/2018  . Gait abnormality 03/27/2018  . CKD (chronic kidney disease) stage 4, GFR 15-29 ml/min (HCC) 12/28/2016  . Normocytic anemia 12/28/2016  . Chronic pain syndrome 12/28/2016  . Benzodiazepine withdrawal ([place]) 12/28/2016  . Benzodiazepine withdrawal without complication (  [place]) 12/27/2016  . Chronic right shoulder pain 12/06/2016  . Fall 09/17/2016  . Fracture of humeral head, closed, right, initial encounter 09/17/2016  . Rib  fractures 09/17/2016  . Multiple falls 09/17/2016  . Severe muscle deconditioning 09/17/2016  . Failure to thrive in adult 09/17/2016  . Melena 04/25/2016  . Essential hypertension 04/25/2016  . Anxiety state 04/25/2016  . Tobacco abuse 04/25/2016  . Hyperlipidemia 04/25/2016  . Syncope 04/24/2016  . Syncope and collapse 04/24/2016  . Gait difficulty 01/12/2016  . Foot drop, left 01/12/2016  . Severe recurrent major depression without psychotic features ([place]) 08/28/2015  . Spondylolisthesis at L4-L5 level 07/30/2015  . PVD (peripheral vascular disease) with claudication ([place]) 07/29/2014  . Weakness-Bilateral arm/leg 07/29/2014  . Numbness-left leg 07/29/2014  . Swelling of limb-Legs 07/29/2014  . Atherosclerosis of native arteries of the extremities with intermittent claudication 09/30/2011   PCP:  Everardo Beals, NP Pharmacy:   [facility] [place], [place] [address] Phone: [phone] Fax: [phone]     Social Determinants of Health (SDOH) Interventions    Readmission Risk Interventions 30 Day Unplanned Readmission Risk Score     ED to Hosp-Admission (Current) from 12/24/2018 in [place]  30 Day Unplanned Readmission Risk Score (%)  36 Filed at 01/04/2019 0801     This score is the patient's risk of an unplanned readmission within 30 days of being discharged (0 -100%). The score is based on dignosis, age, lab data, medications, orders, and past utilization.   Low:  0-14.9   Medium: 15-21.9   High: 22-29.9   Extreme: 30 and above       Readmission Risk Prevention Plan 12/26/2018  Transportation Screening Complete  Medication Review Press photographer) Complete  PCP or Specialist appointment within 3-5 days of discharge Not Complete  PCP/Specialist Appt Not Complete comments pt for snf. Snf MD to see as needed.  [place]  or Home Care Consult Not Complete  HRI or Home Care Consult Pt Refusal Comments NA  SW Recovery Care/Counseling Consult Not Complete  SW Consult Not Complete Comments NA  Palliative Care Screening Complete  Skilled Nursing Facility Complete  Some recent data might be hidden

## 2019-01-04 NOTE — Progress Notes (Signed)
SLP Cancellation Note  Patient Details Name: Alyssa [name] MRN: [id number] DOB: 08-27-1941   Cancelled treatment:       Reason Eval/Treat Not Completed: Other (comment)(pt with nurse tech at this time, pt sounds frustrated, will continue efforts)   Macario Golds 01/04/2019, 12:20 PM  Luanna Salk, [place] [facility] SLP [place] Pager [phone] Office [phone]

## 2019-01-04 NOTE — Progress Notes (Signed)
PROGRESS NOTE  Alyssa [name] EHU:[id number] DOB: [birth year] DOA: 12/24/2018 PCP: Everardo Beals, NP   LOS: 11 days   Brief Narrative / Interim history: 78 year old female with history of stage IV chronic kidney disease, peripheral vascular disease status post femorofemoral bypass in 2013, chronic pain syndrome, hypertension, bipolar disorder, diabetes mellitus (new diagnosis) and ongoing tobacco use who was hospitalized recently (2 weeks back) with NSTEMI, seen by cardiology and recommended conservative management with aspirin, Plavix, statin and Imdur. Patient was discharged to SNF on 2/26. She was brought to the ED on 3/8 after frequent falls at the facility and increased confusion. In the ED she was found to be agitated, confused and required a sitter for safety. She was afebrile and vitals were stable except for mildly elevated systolic blood pressure. Sats dropped down quickly to 78%. Blood work showed elevated RBC of 13. 9K, elevated platelets of 602 with chest x-ray showing left basilar infiltrate and cardiomegaly. CT of the head and cervical spine without acute findings. EKG showed prolonged QTC. Patient admitted for acute toxic metabolic encephalopathy with acute hypoxic respiratory failure secondary to healthcare associated pneumonia and possible UTI.  Subjective: -Remains confused, sleeping but easily awakes.  Assessment & Plan: Principal Problem:   Altered mental status Active Problems:   Failure to thrive in adult   CKD (chronic kidney disease) stage 4, GFR 15-29 ml/min (HCC)   Chronic pain syndrome   Dyspnea on exertion   Encephalopathy, toxic   Aspiration pneumonia of both lower lobes due to gastric secretions (HCC)   Palliative care by specialist   Goals of care, counseling/discussion   Principal Problem Acute hypoxic respiratory failure due to H CAP versus aspiration pneumonia -Patient was placed on antibiotics and finished a course, currently off -On  room air this morning, clinically stable -Repeat chest x-ray showed improvement in pneumonia, all cultures remained negative  Active Problems Acute toxic metabolic encephalopathy with agitation -Psychiatry consulted, appreciate input, patient placed on valproic acid and Restoril -Seroquel was added yesterday with improvement, discontinue sitter this morning -Continue supportive treatment  Stage IV chronic kidney disease -Creatinine at baseline  Hypernatremia -Likely from free water deficits this patient has minimal oral intake, improved  Non-STEMI -Continue aspirin Plavix, conservative management recommended by cardiology  Bipolar disorder -Continue Depakote  Hypokalemia and hypomagnesemia -Replete, now stable  Hypertension -Controlled  Hyperlipidemia -Continue statin  Anemia of chronic kidney disease -Hemoglobin stable  Scheduled Meds: . aspirin EC  81 mg Oral Daily  . atorvastatin  80 mg Oral q1800  . clopidogrel  75 mg Oral Daily  . ferrous sulfate  325 mg Oral Q M,W,F  . heparin  5,000 Units Subcutaneous Q8H  . hydrALAZINE  25 mg Oral Q8H  . isosorbide mononitrate  60 mg Oral Daily  . metoprolol tartrate  5 mg Intravenous Q8H  . multivitamin with minerals  1 tablet Oral Daily  . nystatin  5 mL Oral QID  . pantoprazole  40 mg Oral Daily  . QUEtiapine  12.5 mg Oral BID  . thiamine  100 mg Oral Daily   Continuous Infusions: . dextrose 5 % 1,000 mL with sodium bicarbonate 50 mEq infusion 75 mL/hr at 01/04/19 0206  . valproate sodium 500 mg (01/03/19 2133)   PRN Meds:.hydrALAZINE, LORazepam, temazepam  DVT prophylaxis: heparin Code Status: DNR Family Communication: No family present at bedside Disposition Plan: To be determined, SNF ready  Consultants:   Psychiatry  Procedures:   None   Antimicrobials:  None  Objective: Vitals:   01/02/19 2007 01/03/19 0601 01/03/19 1637 01/03/19 2026  BP: (!) 163/94 (!) 144/95 (!) 148/98 (!) 152/79  Pulse:  66 74 68 81  Resp: 19 18 17 16   Temp: 99.1 F (37.3 C)  99.3 F (37.4 C)   TempSrc: Oral  Oral   SpO2: 98% 96% 99%   Weight:      Height:        Intake/Output Summary (Last 24 hours) at 01/04/2019 1027 Last data filed at 01/04/2019 1003 Gross per 24 hour  Intake 4654.28 ml  Output 1300 ml  Net 3354.28 ml   Filed Weights   12/24/18 1809  Weight: 41.6 kg    Examination:  Constitutional: NAD Respiratory: CTA Cardiovascular: RRR  Data Reviewed: I have independently reviewed following labs and imaging studies   CBC: Recent Labs  Lab 12/30/18 0612 01/02/19 0609  WBC 10.5 9.9  HGB 9.5* 9.1*  HCT 31.9* 28.7*  MCV 92.5 85.7  PLT 554* 115*   Basic Metabolic Panel: Recent Labs  Lab 12/29/18 1101 12/30/18 0237 12/30/18 0612 01/01/19 0520 01/02/19 0609  NA 137 134* 135 139 137  K 3.5 3.8 4.0 4.2 3.7  CL 102 100 98 100 97*  CO2 23 26 26 29 31   GLUCOSE 142* 113* 122* 118* 115*  BUN 15 15 16 12 11   CREATININE 1.26* 1.44* 1.50* 1.26* 1.49*  CALCIUM 8.9 8.4* 8.7* 9.1 8.6*  MG  --  2.1  --   --   --    GFR: Estimated Creatinine Clearance: 21.4 mL/min (A) (by C-G formula based on SCr of 1.49 mg/dL (H)). Liver Function Tests: No results for input(s): AST, ALT, ALKPHOS, BILITOT, PROT, ALBUMIN in the last 168 hours. No results for input(s): LIPASE, AMYLASE in the last 168 hours. No results for input(s): AMMONIA in the last 168 hours. Coagulation Profile: No results for input(s): INR, PROTIME in the last 168 hours. Cardiac Enzymes: No results for input(s): CKTOTAL, CKMB, CKMBINDEX, TROPONINI in the last 168 hours. BNP (last 3 results) No results for input(s): PROBNP in the last 8760 hours. HbA1C: No results for input(s): HGBA1C in the last 72 hours. CBG: No results for input(s): GLUCAP in the last 168 hours. Lipid Profile: No results for input(s): CHOL, HDL, LDLCALC, TRIG, CHOLHDL, LDLDIRECT in the last 72 hours. Thyroid Function Tests: No results for input(s):  TSH, T4TOTAL, FREET4, T3FREE, THYROIDAB in the last 72 hours. Anemia Panel: No results for input(s): VITAMINB12, FOLATE, FERRITIN, TIBC, IRON, RETICCTPCT in the last 72 hours. Urine analysis:    Component Value Date/Time   COLORURINE YELLOW 12/24/2018 1311   APPEARANCEUR HAZY (A) 12/24/2018 1311   LABSPEC 1.011 12/24/2018 1311   PHURINE 6.0 12/24/2018 1311   GLUCOSEU NEGATIVE 12/24/2018 1311   HGBUR SMALL (A) 12/24/2018 1311   BILIRUBINUR NEGATIVE 12/24/2018 1311   KETONESUR NEGATIVE 12/24/2018 1311   PROTEINUR 100 (A) 12/24/2018 1311   UROBILINOGEN 0.2 08/24/2015 1737   NITRITE NEGATIVE 12/24/2018 1311   LEUKOCYTESUR MODERATE (A) 12/24/2018 1311   Sepsis Labs: Invalid input(s): PROCALCITONIN, LACTICIDVEN  No results found for this or any previous visit (from the past 240 hour(s)).    Radiology Studies: No results found.   Marzetta Board, MD, PhD [facility]  Contact via  [email or web address]  [place] P: [phone]  F: [phone]

## 2019-01-04 NOTE — Progress Notes (Signed)
Daily Progress Note   [name]       Date: 01/04/2019 DOB: 11/25/1940  Age: 78 y.o. MRN#: [id number] Attending Physician: Caren Griffins, MD Primary Care Physician: Everardo Beals, NP Admit Date: 12/24/2018  Reason for Consultation/Follow-up: Establishing goals of care  Subjective:  patient is awake, alert, she is not agitated, she has a sitter in the room, she is being helped onto a bedside commode  SLP has been following, she is to be fed with assistance from staff.   No family at bedside, call placed and discussed with [name] see below.   Length of Stay: 11  Current Medications: Scheduled Meds:  . aspirin EC  81 mg Oral Daily  . atorvastatin  80 mg Oral q1800  . clopidogrel  75 mg Oral Daily  . ferrous sulfate  325 mg Oral Q M,W,F  . heparin  5,000 Units Subcutaneous Q8H  . hydrALAZINE  25 mg Oral Q8H  . isosorbide mononitrate  60 mg Oral Daily  . metoprolol tartrate  5 mg Intravenous Q8H  . multivitamin with minerals  1 tablet Oral Daily  . nystatin  5 mL Oral QID  . pantoprazole  40 mg Oral Daily  . QUEtiapine  12.5 mg Oral BID  . thiamine  100 mg Oral Daily    Continuous Infusions: . dextrose 5 % 1,000 mL with sodium bicarbonate 50 mEq infusion 75 mL/hr at 01/04/19 1153  . valproate sodium 500 mg (01/04/19 1154)    PRN Meds: hydrALAZINE, LORazepam, temazepam  Physical Exam         Awake alert Refuses to wear hospital gown No edema Regular work of breathing S1 S2  Abdomen is not tender  Vital Signs: BP (!) 152/79 (BP Location: Left Arm)   Pulse 81   Temp 99.3 F (37.4 C) (Oral)   Resp 16   Ht 5' 2.99" (1.6 m)   Wt 41.6 kg   SpO2 99%   BMI 16.25 kg/m  SpO2: SpO2: 99 % O2 Device: O2 Device: Room Air O2 Flow Rate:    Intake/output summary:   Intake/Output Summary (Last 24 hours) at 01/04/2019 1325 Last data filed at 01/04/2019 1003 Gross per 24 hour  Intake 4534.28 ml  Output 1300 ml  Net 3234.28 ml   LBM: Last BM Date: (  PTA) Baseline Weight: Weight: 41.6 kg Most recent weight: Weight: 41.6 kg       Palliative Assessment/Data:    Flowsheet Rows     Most Recent Value  Intake Tab  Referral Department  Hospitalist  Unit at Time of Referral  Med/Surg Unit  Palliative Care Primary Diagnosis  Sepsis/Infectious Disease  Date Notified  12/28/18  Palliative Care Type  New Palliative care  Reason for referral  Clarify Goals of Care  Date of Admission  12/24/18  Date first seen by Palliative Care  12/29/18  # of days Palliative referral response time  1 Day(s)  # of days IP prior to Palliative referral  4  Clinical Assessment  Palliative Performance Scale Score  50%  Psychosocial & Spiritual Assessment  Palliative Care Outcomes  Patient/Family meeting held?  Yes  Who was at the meeting?  Son via phone      Patient Active Problem List   Diagnosis Date Noted  . Palliative care by specialist   . Goals of care, counseling/discussion   . Altered mental status   . Encephalopathy, toxic 12/24/2018  . Aspiration pneumonia of both lower lobes due to gastric secretions ([place]) 12/24/2018  . Non-ST elevation (NSTEMI) myocardial infarction ([place])   . Chest pain   . Dyspnea on exertion   . Elevated troponin level 12/09/2018  . Chronic migraine 03/27/2018  . Lumbar radiculopathy 03/27/2018  . Gait abnormality 03/27/2018  . CKD (chronic kidney disease) stage 4, GFR 15-29 ml/min (HCC) 12/28/2016  . Normocytic anemia 12/28/2016  . Chronic pain syndrome 12/28/2016  . Benzodiazepine withdrawal ([place]) 12/28/2016  . Benzodiazepine withdrawal without complication ([place]) 67/34/1937  . Chronic right shoulder pain 12/06/2016  . Fall 09/17/2016  . Fracture of humeral head, closed, right, initial encounter  09/17/2016  . Rib fractures 09/17/2016  . Multiple falls 09/17/2016  . Severe muscle deconditioning 09/17/2016  . Failure to thrive in adult 09/17/2016  . Melena 04/25/2016  . Essential hypertension 04/25/2016  . Anxiety state 04/25/2016  . Tobacco abuse 04/25/2016  . Hyperlipidemia 04/25/2016  . Syncope 04/24/2016  . Syncope and collapse 04/24/2016  . Gait difficulty 01/12/2016  . Foot drop, left 01/12/2016  . Severe recurrent major depression without psychotic features ([place]) 08/28/2015  . Spondylolisthesis at L4-L5 level 07/30/2015  . PVD (peripheral vascular disease) with claudication ([place]) 07/29/2014  . Weakness-Bilateral arm/leg 07/29/2014  . Numbness-left leg 07/29/2014  . Swelling of limb-Legs 07/29/2014  . Atherosclerosis of native arteries of the extremities with intermittent claudication 09/30/2011    Palliative Care Assessment & Plan   Patient Profile:  78 year old female with history of stage IV chronic kidney disease, peripheral vascular disease status post femorofemoral bypass in 2013, chronic pain syndrome, hypertension, bipolar disorder, diabetes mellitus (new diagnosis) and ongoing tobacco use who was hospitalized recently (2 weeks back) with NSTEMI, seen by cardiology and recommended conservative management with aspirin, Plavix, statin and Imdur. Patient was discharged to SNF on 2/26. She was brought to the ED on 3/8 after frequent falls at the facility and increased confusion  Assessment:  recurrent risk of aspiration Recent NSTEMI Stage IV CKD Deconditioning, generalized weakness Poor PO intake.  Agitation. PPS 30%  Recommendations/Plan:  continue psych medications Continue to encourage PO Continue current mode of care Agree with DNR Call placed and discussed with [name], who stated that he is the POA, he states that he ahs discussed with CSW and has been given SNF recommendations. He states that he would want the patient to  go to [facility]  rehab  Would recommend palliative care follow up with the patient over there.   Continue to monitor PO intake.      Code Status:    Code Status Orders  (From admission, onward)         Start     Ordered   12/31/18 1359  Do not attempt resuscitation (DNR)  Continuous    Question Answer Comment  In the event of cardiac or respiratory ARREST Do not call a "code blue"   In the event of cardiac or respiratory ARREST Do not perform Intubation, CPR, defibrillation or ACLS   In the event of cardiac or respiratory ARREST Use medication by any route, position, wound care, and other measures to relive pain and suffering. May use oxygen, suction and manual treatment of airway obstruction as needed for comfort.      12/31/18 1400        Code Status History    Date Active Date Inactive Code Status Order ID Comments User Context   12/24/2018 1641 12/31/2018 1400 Full Code [id number]  Karie Kirks, DO ED   12/10/2018 1837 12/13/2018 1920 DNR [id number]  Adrian Prows, MD Inpatient   12/09/2018 1647 12/10/2018 1836 Full Code [id number]  Truett Mainland, DO Inpatient   06/23/2017 1811 06/24/2017 1915 Full Code [id number]  Doreatha Lew, MD ED   06/14/2017 1209 06/14/2017 1824 Full Code [id number]  Nigel Mormon, MD Inpatient   12/28/2016 0016 12/29/2016 2137 Full Code [id number]  Edwin Dada, MD ED   09/17/2016 2114 09/20/2016 2340 Full Code [id number]  Roney Jaffe, MD Inpatient   04/24/2016 2321 04/26/2016 1845 Full Code [id number]  Norval Morton, MD ED   08/27/2015 2101 08/30/2015 1456 Full Code [id number]  Harvel Quale, MD ED   08/26/2015 1802 08/27/2015 0345 Full Code [id number]  Voncille Lo, MD ED   08/01/2015 1727 08/04/2015 1656 Full Code [id number]  Kary Kos, MD Inpatient   07/30/2015 1848 08/01/2015 1727 Full Code [id number]  Kary Kos, MD ED       Prognosis:   guarded. Patient with only sips/bites for PO intake. Ongoing efforts being undertaken.  Discharge Planning:  SNF  rehab with palliative, as per patient's family preference.   Care plan was discussed with  Patient, safety sitter, [name] on the phone. Also discussed with TRH MD.   Thank you for allowing the Palliative Medicine Team to assist in the care of this patient.   Time In: 1300 Time Out: 1325 Total Time 25 Prolonged Time Billed  no       Greater than 50%  of this time was spent counseling and coordinating care related to the above assessment and plan.  Loistine Chance, MD [phone] Please contact Palliative Medicine Team phone at [phone] for questions and concerns.

## 2019-01-04 NOTE — Care Management Important Message (Signed)
Important Message  Patient Details  Name: Alyssa [name] MRN: [id number] Date of Birth: August 06, 1941   Medicare Important Message Given:  Yes    Kerin Salen 01/04/2019, 10:23 [place] Message  Patient Details  Name: Alyssa [name] MRN: [id number] Date of Birth: 1940/10/27   Medicare Important Message Given:  Yes    Kerin Salen 01/04/2019, 10:23 AM

## 2019-01-04 NOTE — Progress Notes (Signed)
Physical Therapy Treatment Patient Details Name: Alyssa [name] MRN: [id number] DOB: 08-Oct-1941 Today's Date: 01/04/2019    History of Present Illness 78 yo female admitted to ED on 3/8 with multiple falls, AMS due to suspected UTI. PMH includes anxiety, OA, chest pain with NSTEMI 12/13/18, CKD, HLD, HTN, peripheral neuropathy, PVD, chronic pain, FTT, depression, L foot drop, R LLD, back surgery, femoral bypass graft, bipolar disorder, R THA.     PT Comments    Pt curled up in bed sleeping.  Easily awoken to name briefly.  Assisted OOB to [facility] pt did void 250 but required + 2 assist and 100% VC's for direction and awareness to current situation.  Pt in constant motion and attempting to disrobe gown.  Pt was able to amb however required + 2 side by side assist and was unable to use any AD.  Poor flex posture and scissor gait.  Assisted back to bed sidelying.  Pt will need SNF placement.    Follow Up Recommendations  SNF     Equipment Recommendations  None recommended by PT    Recommendations for Other Services       Precautions / Restrictions Precautions Precautions: Fall Precaution Comments: Multiples falls in the past few weeks (advanced dementia) Restrictions Weight Bearing Restrictions: No    Mobility  Bed Mobility Overal bed mobility: Needs Assistance Bed Mobility: Supine to Sit;Sit to Supine     Supine to sit: Mod assist Sit to supine: Mod assist   General bed mobility comments: pt awake and restless.  Not following any commands.  Trying to disrobe.  Assisted to EOB, pt in constant motion.  assisted to [facility] then back to bed.    Transfers Overall transfer level: Needs assistance Equipment used: 2 person hand held assist Transfers: Sit to/from [facility] Sit to Stand: +2 physical assistance;Mod assist;Max assist Stand pivot transfers: Max assist;Total assist       General transfer comment: unable to attempt transfer with AD due to pt's inablity  to follow commands and remains in constant motion.  Assisted from EOB to [facility] 1/4 pivot  with partial stance and brief time.  Constant guard to prevent forward LOB on BSC.  When assisted off, second assist was needed for peri care.  Assisted with amb and then back to bed.  Ambulation/Gait Ambulation/Gait assistance: Mod assist;Max assist;+2 physical assistance;+2 safety/equipment Gait Distance (Feet): 8 Feet Assistive device: 2 person hand held assist Gait Pattern/deviations: Step-to pattern;Decreased weight shift to right Gait velocity: decr    General Gait Details: unable to functionally use a walker.  + 2 side by side assist a limited distance with max cueing for direction and upright posture.  Pt unable to follow commands.     Stairs             Wheelchair Mobility    Modified Rankin (Stroke Patients Only)       Balance                                            Cognition Arousal/Alertness: Awake/alert Behavior During Therapy: Restless Overall Cognitive Status: Impaired/Different from baseline                                 General Comments: pt awake and restless trying to dis robe.  Exercises      General Comments        Pertinent Vitals/Pain Pain Assessment: No/denies pain    Home Living                      Prior Function            PT Goals (current goals can now be found in the care plan section) Progress towards PT goals: Progressing toward goals    Frequency    Min 2X/week      PT Plan Current plan remains appropriate    Co-evaluation              AM-PAC PT "6 Clicks" Mobility   Outcome Measure  Help needed turning from your back to your side while in a flat bed without using bedrails?: Total Help needed moving from lying on your back to sitting on the side of a flat bed without using bedrails?: Total Help needed moving to and from a bed to a chair (including a wheelchair)?:  Total Help needed standing up from a chair using your arms (e.g., wheelchair or bedside chair)?: Total Help needed to walk in hospital room?: Total Help needed climbing 3-5 steps with a railing? : Total 6 Click Score: 6    End of Session Equipment Utilized During Treatment: Gait belt Activity Tolerance: Other (comment)(limited by cognition) Patient left: in bed;with bed alarm set Nurse Communication: Mobility status PT Visit Diagnosis: Other abnormalities of gait and mobility (R26.89);History of falling (Z91.81)     Time: 1435-1450 PT Time Calculation (min) (ACUTE ONLY): 15 min  Charges:  $Therapeutic Activity: 8-22 mins                     Rica Koyanagi  PTA Acute  Rehabilitation Services Pager      [phone] Office      [phone]

## 2019-01-05 MED ORDER — HALOPERIDOL LACTATE 5 MG/ML IJ SOLN: 0.5000 mg | Freq: Four times a day (QID) | INTRAMUSCULAR | Status: DC | PRN

## 2019-01-05 MED ORDER — HALOPERIDOL 0.5 MG PO TABS
0.5000 mg | ORAL_TABLET | Freq: Three times a day (TID) | ORAL | Status: DC | PRN
  Administered 2019-01-05: 0.5 mg via ORAL
  Filled 2019-01-05 (×2): qty 1

## 2019-01-05 MED ORDER — VALPROIC ACID 250 MG PO CAPS
500.0000 mg | ORAL_CAPSULE | Freq: Two times a day (BID) | ORAL | Status: DC
  Administered 2019-01-05 – 2019-01-09 (×9): 500 mg via ORAL
  Filled 2019-01-05 (×9): qty 2

## 2019-01-05 MED ORDER — METOPROLOL TARTRATE 25 MG PO TABS
25.0000 mg | ORAL_TABLET | Freq: Two times a day (BID) | ORAL | Status: DC
  Administered 2019-01-05 – 2019-01-09 (×9): 25 mg via ORAL
  Filled 2019-01-05 (×9): qty 1

## 2019-01-05 NOTE — Progress Notes (Signed)
PROGRESS NOTE  Alyssa [name]:[id number] DOB: December 14, 1940 DOA: 12/24/2018 PCP: Everardo Beals, NP   LOS: 12 days   Brief Narrative / Interim history: 78 year old female with history of stage IV chronic kidney disease, peripheral vascular disease status post femorofemoral bypass in 2013, chronic pain syndrome, hypertension, bipolar disorder, diabetes mellitus (new diagnosis) and ongoing tobacco use who was hospitalized recently (2 weeks back) with NSTEMI, seen by cardiology and recommended conservative management with aspirin, Plavix, statin and Imdur. Patient was discharged to SNF on 2/26. She was brought to the ED on 3/8 after frequent falls at the facility and increased confusion. In the ED she was found to be agitated, confused and required a sitter for safety. She was afebrile and vitals were stable except for mildly elevated systolic blood pressure. Sats dropped down quickly to 78%. Blood work showed elevated RBC of 13. 9K, elevated platelets of 602 with chest x-ray showing left basilar infiltrate and cardiomegaly. CT of the head and cervical spine without acute findings. EKG showed prolonged QTC. Patient admitted for acute toxic metabolic encephalopathy with acute hypoxic respiratory failure secondary to healthcare associated pneumonia and possible UTI.  Subjective: -much more calm this morning, mental status improved, no complaints   Assessment & Plan: Principal Problem:   Altered mental status Active Problems:   Failure to thrive in adult   CKD (chronic kidney disease) stage 4, GFR 15-29 ml/min (HCC)   Chronic pain syndrome   Dyspnea on exertion   Encephalopathy, toxic   Aspiration pneumonia of both lower lobes due to gastric secretions (HCC)   Palliative care by specialist   Goals of care, counseling/discussion   Principal Problem Acute hypoxic respiratory failure due to H CAP versus aspiration pneumonia -Patient was placed on antibiotics and finished a  course, currently off -On room air this morning, clinically stable -Repeat chest x-ray showed improvement in pneumonia, all cultures remained negative  Active Problems Acute toxic metabolic encephalopathy with agitation -Psychiatry consulted, appreciate input, patient placed on valproic acid and Restoril -Seroquel was added with improvement, now no longer requiring sitter -Continue supportive treatment -Suspect related to recurrent hospitalization,?  Mild underlying cognitive impairment at baseline, at 1 point will benefit from outpatient neuropsychiatric testing  Disposition -Patient does not have history of multiple hospitalizations in the past and mostly her care was done through outpatient work and lived at home.  She does have a history of peripheral neuropathy for which she has seen neurosurgery (with history of lumbar decompression surgery in October 2016) as well as neurology most recently in October 2019.  She was also experiencing right-sided headaches, gait abnormalities and low back pain radiating to bilateral lower extremities.  She had a hospitalization in February 2020 for NSTEMI for which conservative management was recommended.  She was discharged on 2/26 at SNF for rehab and was readmitted roughly 10 days later for acute encephalopathy thought to be due to pneumonia.  Physical therapy reevaluated patient during this hospital stay and recommended SNF.  In my opinion I think SNF for rehab purposes is appropriate to return patient to the prior level of functioning which was relatively independent and her short stay in rehab between these 2 hospitalizations were not giving her enough time to achieve full potential. This patient needs rehab services for at least 5 days per week and skilled nursing services daily to facilitate this transition. Rehab is being requested as the most appropriate d/c option for this patient and is NOT felt to be for custodial care.  Stage IV chronic kidney  disease -Creatinine at baseline  Hypernatremia -Likely from free water deficits this patient has minimal oral intake, improved  Non-STEMI -Continue aspirin Plavix, conservative management recommended by cardiology  Bipolar disorder -Continue Depakote  Hypokalemia and hypomagnesemia -Replete, now stable  Hypertension -Controlled  Hyperlipidemia -Continue statin  Anemia of chronic kidney disease -Hemoglobin stable  Scheduled Meds: . aspirin EC  81 mg Oral Daily  . atorvastatin  80 mg Oral q1800  . clopidogrel  75 mg Oral Daily  . ferrous sulfate  325 mg Oral Q M,W,F  . heparin  5,000 Units Subcutaneous Q8H  . hydrALAZINE  25 mg Oral Q8H  . isosorbide mononitrate  60 mg Oral Daily  . metoprolol tartrate  25 mg Oral BID  . multivitamin with minerals  1 tablet Oral Daily  . nystatin  5 mL Oral QID  . pantoprazole  40 mg Oral Daily  . QUEtiapine  12.5 mg Oral BID  . thiamine  100 mg Oral Daily  . valproic acid  500 mg Oral BID   Continuous Infusions:  PRN Meds:.hydrALAZINE, LORazepam, temazepam  DVT prophylaxis: heparin Code Status: DNR Family Communication: No family present at bedside Disposition Plan: To be determined, SNF ready  Consultants:   Psychiatry  Procedures:   None   Antimicrobials:  None    Objective: Vitals:   01/03/19 1637 01/03/19 2026 01/04/19 2235 01/05/19 0534  BP: (!) 148/98 (!) 152/79 137/76 (!) 156/85  Pulse: 68 81 96 93  Resp: 17 16 18    Temp: 99.3 F (37.4 C)  98.7 F (37.1 C) 98.1 F (36.7 C)  TempSrc: Oral  Oral Axillary  SpO2: 99%  98% 100%  Weight:      Height:        Intake/Output Summary (Last 24 hours) at 01/05/2019 1528 Last data filed at 01/05/2019 1500 Gross per 24 hour  Intake -  Output 1100 ml  Net -1100 ml   Filed Weights   12/24/18 1809  Weight: 41.6 kg    Examination:  Constitutional: NAD Respiratory: CTA Cardiovascular: RRR  Data Reviewed: I have independently reviewed following labs and  imaging studies   CBC: Recent Labs  Lab 12/30/18 0612 01/02/19 0609  WBC 10.5 9.9  HGB 9.5* 9.1*  HCT 31.9* 28.7*  MCV 92.5 85.7  PLT 554* 226*   Basic Metabolic Panel: Recent Labs  Lab 12/30/18 0237 12/30/18 0612 01/01/19 0520 01/02/19 0609  NA 134* 135 139 137  K 3.8 4.0 4.2 3.7  CL 100 98 100 97*  CO2 26 26 29 31   GLUCOSE 113* 122* 118* 115*  BUN 15 16 12 11   CREATININE 1.44* 1.50* 1.26* 1.49*  CALCIUM 8.4* 8.7* 9.1 8.6*  MG 2.1  --   --   --    GFR: Estimated Creatinine Clearance: 21.4 mL/min (A) (by C-G formula based on SCr of 1.49 mg/dL (H)). Liver Function Tests: No results for input(s): AST, ALT, ALKPHOS, BILITOT, PROT, ALBUMIN in the last 168 hours. No results for input(s): LIPASE, AMYLASE in the last 168 hours. No results for input(s): AMMONIA in the last 168 hours. Coagulation Profile: No results for input(s): INR, PROTIME in the last 168 hours. Cardiac Enzymes: No results for input(s): CKTOTAL, CKMB, CKMBINDEX, TROPONINI in the last 168 hours. BNP (last 3 results) No results for input(s): PROBNP in the last 8760 hours. HbA1C: No results for input(s): HGBA1C in the last 72 hours. CBG: No results for input(s): GLUCAP in the last 168 hours.  Lipid Profile: No results for input(s): CHOL, HDL, LDLCALC, TRIG, CHOLHDL, LDLDIRECT in the last 72 hours. Thyroid Function Tests: No results for input(s): TSH, T4TOTAL, FREET4, T3FREE, THYROIDAB in the last 72 hours. Anemia Panel: No results for input(s): VITAMINB12, FOLATE, FERRITIN, TIBC, IRON, RETICCTPCT in the last 72 hours. Urine analysis:    Component Value Date/Time   COLORURINE YELLOW 12/24/2018 1311   APPEARANCEUR HAZY (A) 12/24/2018 1311   LABSPEC 1.011 12/24/2018 1311   PHURINE 6.0 12/24/2018 1311   GLUCOSEU NEGATIVE 12/24/2018 1311   HGBUR SMALL (A) 12/24/2018 1311   BILIRUBINUR NEGATIVE 12/24/2018 1311   KETONESUR NEGATIVE 12/24/2018 1311   PROTEINUR 100 (A) 12/24/2018 1311   UROBILINOGEN 0.2  08/24/2015 1737   NITRITE NEGATIVE 12/24/2018 1311   LEUKOCYTESUR MODERATE (A) 12/24/2018 1311   Sepsis Labs: Invalid input(s): PROCALCITONIN, LACTICIDVEN  No results found for this or any previous visit (from the past 240 hour(s)).    Radiology Studies: No results found.   Marzetta Board, MD, PhD [facility]  Contact via  [email or web address]  Rafael Gonzalez P: [phone]  F: [phone]

## 2019-01-06 MED ORDER — QUETIAPINE FUMARATE 25 MG PO TABS
25.0000 mg | ORAL_TABLET | Freq: Two times a day (BID) | ORAL | Status: DC
  Administered 2019-01-06 – 2019-01-09 (×7): 25 mg via ORAL
  Filled 2019-01-06 (×7): qty 1

## 2019-01-06 NOTE — Progress Notes (Addendum)
PROGRESS NOTE  Alyssa [name] GXQ:[id number] DOB: 02-23-1941 DOA: 12/24/2018 PCP: Everardo Beals, NP   LOS: 13 days   Brief Narrative / Interim history: 78 year old female with history of stage IV chronic kidney disease, peripheral vascular disease status post femorofemoral bypass in 2013, chronic pain syndrome, hypertension, bipolar disorder, diabetes mellitus (new diagnosis) and ongoing tobacco use who was hospitalized recently (2 weeks back) with NSTEMI, seen by cardiology and recommended conservative management with aspirin, Plavix, statin and Imdur. Patient was discharged to SNF on 2/26. She was brought to the ED on 3/8 after frequent falls at the facility and increased confusion. In the ED she was found to be agitated, confused and required a sitter for safety. She was afebrile and vitals were stable except for mildly elevated systolic blood pressure. Sats dropped down quickly to 78%. Blood work showed elevated RBC of 13. 9K, elevated platelets of 602 with chest x-ray showing left basilar infiltrate and cardiomegaly. CT of the head and cervical spine without acute findings. EKG showed prolonged QTC. Patient admitted for acute toxic metabolic encephalopathy with acute hypoxic respiratory failure secondary to healthcare associated pneumonia and possible UTI.  Subjective: -no complaints this morning, smiling and appears pleasant.   Assessment & Plan: Principal Problem:   Altered mental status Active Problems:   Failure to thrive in adult   CKD (chronic kidney disease) stage 4, GFR 15-29 ml/min (HCC)   Chronic pain syndrome   Dyspnea on exertion   Encephalopathy, toxic   Aspiration pneumonia of both lower lobes due to gastric secretions (HCC)   Palliative care by specialist   Goals of care, counseling/discussion   Principal Problem Acute hypoxic respiratory failure due to H CAP versus aspiration pneumonia -Patient was placed on antibiotics and finished a course,  currently off -On room air this morning, clinically stable -Repeat chest x-ray showed improvement in pneumonia, all cultures remained negative  Active Problems Acute toxic metabolic encephalopathy with agitation -Psychiatry consulted, appreciate input, patient placed on valproic acid and Restoril -Seroquel was added with improvement, now no longer requiring sitter -Continue supportive treatment -Suspect related to recurrent hospitalization,?  Mild underlying cognitive impairment at baseline, at 1 point will benefit from outpatient neuropsychiatric testing  Disposition -Patient does not have history of multiple hospitalizations in the past and mostly her care was done through outpatient work and lived at home.  She does have a history of peripheral neuropathy for which she has seen neurosurgery (with history of lumbar decompression surgery in October 2016) as well as neurology most recently in October 2019.  She was also experiencing right-sided headaches, gait abnormalities and low back pain radiating to bilateral lower extremities.  She had a hospitalization in February 2020 for NSTEMI for which conservative management was recommended.  She was discharged on 2/26 at SNF for rehab and was readmitted roughly 10 days later for acute encephalopathy thought to be due to pneumonia.  Physical therapy reevaluated patient during this hospital stay and recommended SNF.  In my opinion I think SNF for rehab purposes is appropriate to return patient to the prior level of functioning which was relatively independent. It is my opinion that her short stay in rehab between these 2 hospitalizations may have not given her enough time to achieve full potential. This patient needs rehab services for at least 5 days per week and skilled nursing services daily to facilitate this transition. Rehab is being requested as the most appropriate d/c option for this patient and is NOT felt to be for  custodial care. Discussed with the  husband as well who confirms that prior to February she was independent and ambulatory. He denies any significant cognitive deficits prior.  -Did peer to peer today 01/06/2019, awaiting final determination from the insurance company  Stage IV chronic kidney disease -Creatinine at baseline  Hypernatremia -Likely from free water deficits this patient has minimal oral intake, improved  Non-STEMI -Continue aspirin Plavix, conservative management recommended by cardiology  Bipolar disorder -Continue Depakote  Hypokalemia and hypomagnesemia -Replete, now stable  Hypertension -Controlled  Hyperlipidemia -Continue statin  Anemia of chronic kidney disease -Hemoglobin stable  Scheduled Meds: . aspirin EC  81 mg Oral Daily  . atorvastatin  80 mg Oral q1800  . clopidogrel  75 mg Oral Daily  . ferrous sulfate  325 mg Oral Q M,W,F  . heparin  5,000 Units Subcutaneous Q8H  . hydrALAZINE  25 mg Oral Q8H  . isosorbide mononitrate  60 mg Oral Daily  . metoprolol tartrate  25 mg Oral BID  . multivitamin with minerals  1 tablet Oral Daily  . nystatin  5 mL Oral QID  . pantoprazole  40 mg Oral Daily  . QUEtiapine  25 mg Oral BID  . thiamine  100 mg Oral Daily  . valproic acid  500 mg Oral BID   Continuous Infusions:  PRN Meds:.haloperidol **OR** haloperidol lactate, hydrALAZINE, temazepam  DVT prophylaxis: heparin Code Status: DNR Family Communication: No family present at bedside Disposition Plan: To be determined, SNF ready  Consultants:   Psychiatry  Procedures:   None   Antimicrobials:  None    Objective: Vitals:   01/04/19 2235 01/05/19 0534 01/05/19 2100 01/05/19 2200  BP: 137/76 (!) 156/85 (!) 206/103 (!) 154/98  Pulse: 96 93 70   Resp: 18     Temp: 98.7 F (37.1 C) 98.1 F (36.7 C) 98.4 F (36.9 C)   TempSrc: Oral Axillary Oral   SpO2: 98% 100% 100%   Weight:      Height:        Intake/Output Summary (Last 24 hours) at 01/06/2019 1004 Last data filed  at 01/06/2019 0959 Gross per 24 hour  Intake 240 ml  Output 605 ml  Net -365 ml   Filed Weights   12/24/18 1809  Weight: 41.6 kg    Examination:  Constitutional: NAD Respiratory: CTA Cardiovascular: RRR  Data Reviewed: I have independently reviewed following labs and imaging studies   CBC: Recent Labs  Lab 01/02/19 0609  WBC 9.9  HGB 9.1*  HCT 28.7*  MCV 85.7  PLT 809*   Basic Metabolic Panel: Recent Labs  Lab 01/01/19 0520 01/02/19 0609  NA 139 137  K 4.2 3.7  CL 100 97*  CO2 29 31  GLUCOSE 118* 115*  BUN 12 11  CREATININE 1.26* 1.49*  CALCIUM 9.1 8.6*   GFR: Estimated Creatinine Clearance: 21.4 mL/min (A) (by C-G formula based on SCr of 1.49 mg/dL (H)). Liver Function Tests: No results for input(s): AST, ALT, ALKPHOS, BILITOT, PROT, ALBUMIN in the last 168 hours. No results for input(s): LIPASE, AMYLASE in the last 168 hours. No results for input(s): AMMONIA in the last 168 hours. Coagulation Profile: No results for input(s): INR, PROTIME in the last 168 hours. Cardiac Enzymes: No results for input(s): CKTOTAL, CKMB, CKMBINDEX, TROPONINI in the last 168 hours. BNP (last 3 results) No results for input(s): PROBNP in the last 8760 hours. HbA1C: No results for input(s): HGBA1C in the last 72 hours. CBG: No results for input(s):  GLUCAP in the last 168 hours. Lipid Profile: No results for input(s): CHOL, HDL, LDLCALC, TRIG, CHOLHDL, LDLDIRECT in the last 72 hours. Thyroid Function Tests: No results for input(s): TSH, T4TOTAL, FREET4, T3FREE, THYROIDAB in the last 72 hours. Anemia Panel: No results for input(s): VITAMINB12, FOLATE, FERRITIN, TIBC, IRON, RETICCTPCT in the last 72 hours. Urine analysis:    Component Value Date/Time   COLORURINE YELLOW 12/24/2018 1311   APPEARANCEUR HAZY (A) 12/24/2018 1311   LABSPEC 1.011 12/24/2018 1311   PHURINE 6.0 12/24/2018 1311   GLUCOSEU NEGATIVE 12/24/2018 1311   HGBUR SMALL (A) 12/24/2018 1311   BILIRUBINUR  NEGATIVE 12/24/2018 1311   KETONESUR NEGATIVE 12/24/2018 1311   PROTEINUR 100 (A) 12/24/2018 1311   UROBILINOGEN 0.2 08/24/2015 1737   NITRITE NEGATIVE 12/24/2018 1311   LEUKOCYTESUR MODERATE (A) 12/24/2018 1311   Sepsis Labs: Invalid input(s): PROCALCITONIN, LACTICIDVEN  No results found for this or any previous visit (from the past 240 hour(s)).    Radiology Studies: No results found.   Marzetta Board, MD, PhD [facility]  Contact via  [email or web address]  [place] P: [phone]  F: [phone]

## 2019-01-07 NOTE — Progress Notes (Signed)
Physical Therapy Treatment Patient Details Name: Alyssa [name] MRN: [id number] DOB: 1941/03/26 Today's Date: 01/07/2019    History of Present Illness 78 yo female admitted to ED on 3/8 with multiple falls, AMS due to suspected UTI. PMH includes anxiety, OA, chest pain with NSTEMI 12/13/18, CKD, HLD, HTN, peripheral neuropathy, PVD, chronic pain, FTT, depression, L foot drop, R LLD, back surgery, femoral bypass graft, bipolar disorder, R THA.     PT Comments    Progressing with mobility. Multimodal cueing required. Pt participated well on today. She is easily redirected when necessary. Continue to recommend SNF for rehab.    Follow Up Recommendations  SNF     Equipment Recommendations  None recommended by PT    Recommendations for Other Services       Precautions / Restrictions Precautions Precautions: Fall Restrictions Weight Bearing Restrictions: No    Mobility  Bed Mobility Overal bed mobility: Needs Assistance Bed Mobility: Supine to Sit;Sit to Supine     Supine to sit: Min assist;HOB elevated Sit to supine: Min assist;HOB elevated   General bed mobility comments: Assist for trunk and LEs. Multimodal cueing required.   Transfers Overall transfer level: Needs assistance Equipment used: Rolling walker (2 wheeled) Transfers: Sit to/from Stand Sit to Stand: Min assist;+2 physical assistance;+2 safety/equipment         General transfer comment: Assist to rise, stabilize, control descent. Multimodal cueing required.   Ambulation/Gait Ambulation/Gait assistance: Min assist;+2 safety/equipment Gait Distance (Feet): 20 Feet Assistive device: Rolling walker (2 wheeled) Gait Pattern/deviations: Step-through pattern;Trunk flexed     General Gait Details: Assist to stabilize pt and maneuver safely with RW. +2 for safety in case pt needed to sit but did not have to use recliner.    Stairs             Wheelchair Mobility    Modified Rankin (Stroke  Patients Only)       Balance Overall balance assessment: Needs assistance         Standing balance support: Bilateral upper extremity supported Standing balance-Leahy Scale: Poor                              Cognition Arousal/Alertness: Awake/alert Behavior During Therapy: Restless Overall Cognitive Status: Impaired/Different from baseline Area of Impairment: Orientation;Following commands;Safety/judgement;Problem solving                 Orientation Level: Disoriented to;Time;Place;Situation     Following Commands: Follows one step commands inconsistently Safety/Judgement: Decreased awareness of deficits;Decreased awareness of safety   Problem Solving: Decreased initiation;Slow processing;Difficulty sequencing;Requires tactile cues;Requires verbal cues        Exercises      General Comments        Pertinent Vitals/Pain Pain Assessment: No/denies pain    Home Living                      Prior Function            PT Goals (current goals can now be found in the care plan section) Progress towards PT goals: Progressing toward goals    Frequency    Min 2X/week      PT Plan Current plan remains appropriate    Co-evaluation              AM-PAC PT "6 Clicks" Mobility   Outcome Measure  Help needed turning from your back to your side while in  a flat bed without using bedrails?: A Lot Help needed moving from lying on your back to sitting on the side of a flat bed without using bedrails?: A Lot Help needed moving to and from a bed to a chair (including a wheelchair)?: A Lot Help needed standing up from a chair using your arms (e.g., wheelchair or bedside chair)?: A Lot Help needed to walk in hospital room?: A Lot Help needed climbing 3-5 steps with a railing? : A Lot 6 Click Score: 12    End of Session Equipment Utilized During Treatment: Gait belt Activity Tolerance: Patient tolerated treatment well Patient left: in  bed;with call bell/phone within reach;with bed alarm set   PT Visit Diagnosis: Other abnormalities of gait and mobility (R26.89);History of falling (Z91.81)     Time: 3086-5784 PT Time Calculation (min) (ACUTE ONLY): 8 min  Charges:  $Gait Training: 8-22 mins                       Weston Anna, PT Acute Rehabilitation Services Pager: [phone] Office: [phone]

## 2019-01-07 NOTE — Progress Notes (Signed)
PROGRESS NOTE  Alyssa [name] VZD:[id number] DOB: 1941/01/28 DOA: 12/24/2018 PCP: Everardo Beals, NP   LOS: 14 days   Brief Narrative / Interim history: 78 year old female with history of stage IV chronic kidney disease, peripheral vascular disease status post femorofemoral bypass in 2013, chronic pain syndrome, hypertension, bipolar disorder, diabetes mellitus (new diagnosis) and ongoing tobacco use who was hospitalized recently (2 weeks back) with NSTEMI, seen by cardiology and recommended conservative management with aspirin, Plavix, statin and Imdur. Patient was discharged to SNF on 2/26. She was brought to the ED on 3/8 after frequent falls at the facility and increased confusion. In the ED she was found to be agitated, confused and required a sitter for safety. She was afebrile and vitals were stable except for mildly elevated systolic blood pressure. Sats dropped down quickly to 78%. Blood work showed elevated RBC of 13. 9K, elevated platelets of 602 with chest x-ray showing left basilar infiltrate and cardiomegaly. CT of the head and cervical spine without acute findings. EKG showed prolonged QTC. Patient admitted for acute toxic metabolic encephalopathy with acute hypoxic respiratory failure secondary to healthcare associated pneumonia and possible UTI.  Subjective: -Sleeping, easily awakened, no complaints.  Pleasant appearing.  Had a good night per nurse  Assessment & Plan: Principal Problem:   Altered mental status Active Problems:   Failure to thrive in adult   CKD (chronic kidney disease) stage 4, GFR 15-29 ml/min (HCC)   Chronic pain syndrome   Dyspnea on exertion   Encephalopathy, toxic   Aspiration pneumonia of both lower lobes due to gastric secretions (HCC)   Palliative care by specialist   Goals of care, counseling/discussion   Principal Problem Acute hypoxic respiratory failure due to H CAP versus aspiration pneumonia -Patient was placed on  antibiotics and finished a course, currently off -On room air this morning, clinically stable -Repeat chest x-ray showed improvement in pneumonia, all cultures remained negative  Active Problems Acute toxic metabolic encephalopathy with agitation -Psychiatry consulted, appreciate input, patient placed on valproic acid and Restoril -Seroquel was added with improvement, now no longer requiring sitter -Continue supportive treatment -Suspect related to recurrent hospitalization,?  Mild underlying cognitive impairment at baseline, at 1 point will benefit from outpatient neuropsychiatric testing  Disposition -Patient does not have history of multiple hospitalizations in the past and mostly her care was done through outpatient work and lived at home.  She does have a history of peripheral neuropathy for which she has seen neurosurgery (with history of lumbar decompression surgery in October 2016) as well as neurology most recently in October 2019.  She was also experiencing right-sided headaches, gait abnormalities and low back pain radiating to bilateral lower extremities.  She had a hospitalization in February 2020 for NSTEMI for which conservative management was recommended.  She was discharged on 2/26 at SNF for rehab and was readmitted roughly 10 days later for acute encephalopathy thought to be due to pneumonia.  Physical therapy reevaluated patient during this hospital stay and recommended SNF.  In my opinion I think SNF for rehab purposes is appropriate to return patient to the prior level of functioning which was relatively independent. It is my opinion that her short stay in rehab between these 2 hospitalizations may have not given her enough time to achieve full potential. This patient needs rehab services for at least 5 days per week and skilled nursing services daily to facilitate this transition. Rehab is being requested as the most appropriate d/c option for this patient and  is NOT felt to be for  custodial care. Discussed with the husband as well who confirms that prior to February she was independent and ambulatory. He denies any significant cognitive deficits prior.  -Did peer to peer today 01/06/2019, awaiting final determination from the insurance company  Stage IV chronic kidney disease -Creatinine at baseline  Hypernatremia -Likely from free water deficits this patient has minimal oral intake, improved  Non-STEMI -Continue aspirin Plavix, conservative management recommended by cardiology  Bipolar disorder -Continue Depakote  Hypokalemia and hypomagnesemia -Replete, now stable  Hypertension -Controlled  Hyperlipidemia -Continue statin  Anemia of chronic kidney disease -Hemoglobin stable  Scheduled Meds: . aspirin EC  81 mg Oral Daily  . atorvastatin  80 mg Oral q1800  . clopidogrel  75 mg Oral Daily  . ferrous sulfate  325 mg Oral Q M,W,F  . heparin  5,000 Units Subcutaneous Q8H  . hydrALAZINE  25 mg Oral Q8H  . isosorbide mononitrate  60 mg Oral Daily  . metoprolol tartrate  25 mg Oral BID  . multivitamin with minerals  1 tablet Oral Daily  . nystatin  5 mL Oral QID  . pantoprazole  40 mg Oral Daily  . QUEtiapine  25 mg Oral BID  . thiamine  100 mg Oral Daily  . valproic acid  500 mg Oral BID   Continuous Infusions:  PRN Meds:.haloperidol **OR** haloperidol lactate, hydrALAZINE, temazepam  DVT prophylaxis: heparin Code Status: DNR Family Communication: No family present at bedside Disposition Plan: To be determined, SNF ready  Consultants:   Psychiatry  Palliative care  Procedures:   None   Antimicrobials:  None    Objective: Vitals:   01/05/19 2100 01/05/19 2200 01/06/19 1418 01/06/19 2234  BP: (!) 206/103 (!) 154/98 (!) 147/73 139/86  Pulse: 70  70 70  Resp:   16 17  Temp: 98.4 F (36.9 C)  98.4 F (36.9 C) 98.3 F (36.8 C)  TempSrc: Oral  Oral Oral  SpO2: 100%  100% 100%  Weight:      Height:        Intake/Output  Summary (Last 24 hours) at 01/07/2019 0914 Last data filed at 01/07/2019 0000 Gross per 24 hour  Intake 240 ml  Output 700 ml  Net -460 ml   Filed Weights   12/24/18 1809  Weight: 41.6 kg    Examination:  Constitutional: NAD Respiratory: CTA Cardiovascular: RRR  Data Reviewed: I have independently reviewed following labs and imaging studies   CBC: Recent Labs  Lab 01/02/19 0609  WBC 9.9  HGB 9.1*  HCT 28.7*  MCV 85.7  PLT 496*   Basic Metabolic Panel: Recent Labs  Lab 01/01/19 0520 01/02/19 0609  NA 139 137  K 4.2 3.7  CL 100 97*  CO2 29 31  GLUCOSE 118* 115*  BUN 12 11  CREATININE 1.26* 1.49*  CALCIUM 9.1 8.6*   GFR: Estimated Creatinine Clearance: 21.4 mL/min (A) (by C-G formula based on SCr of 1.49 mg/dL (H)). Liver Function Tests: No results for input(s): AST, ALT, ALKPHOS, BILITOT, PROT, ALBUMIN in the last 168 hours. No results for input(s): LIPASE, AMYLASE in the last 168 hours. No results for input(s): AMMONIA in the last 168 hours. Coagulation Profile: No results for input(s): INR, PROTIME in the last 168 hours. Cardiac Enzymes: No results for input(s): CKTOTAL, CKMB, CKMBINDEX, TROPONINI in the last 168 hours. BNP (last 3 results) No results for input(s): PROBNP in the last 8760 hours. HbA1C: No results for input(s): HGBA1C in  the last 72 hours. CBG: No results for input(s): GLUCAP in the last 168 hours. Lipid Profile: No results for input(s): CHOL, HDL, LDLCALC, TRIG, CHOLHDL, LDLDIRECT in the last 72 hours. Thyroid Function Tests: No results for input(s): TSH, T4TOTAL, FREET4, T3FREE, THYROIDAB in the last 72 hours. Anemia Panel: No results for input(s): VITAMINB12, FOLATE, FERRITIN, TIBC, IRON, RETICCTPCT in the last 72 hours. Urine analysis:    Component Value Date/Time   COLORURINE YELLOW 12/24/2018 1311   APPEARANCEUR HAZY (A) 12/24/2018 1311   LABSPEC 1.011 12/24/2018 1311   PHURINE 6.0 12/24/2018 1311   GLUCOSEU NEGATIVE  12/24/2018 1311   HGBUR SMALL (A) 12/24/2018 1311   BILIRUBINUR NEGATIVE 12/24/2018 1311   KETONESUR NEGATIVE 12/24/2018 1311   PROTEINUR 100 (A) 12/24/2018 1311   UROBILINOGEN 0.2 08/24/2015 1737   NITRITE NEGATIVE 12/24/2018 1311   LEUKOCYTESUR MODERATE (A) 12/24/2018 1311   Sepsis Labs: Invalid input(s): PROCALCITONIN, LACTICIDVEN  No results found for this or any previous visit (from the past 240 hour(s)).    Radiology Studies: No results found.   Marzetta Board, MD, PhD [facility]  Contact via  [email or web address]  [place] P: [phone]  F: [phone]

## 2019-01-08 LAB — BASIC METABOLIC PANEL
Anion gap: 13 (ref 5–15)
BUN: 25 mg/dL — ABNORMAL HIGH (ref 8–23)
CO2: 24 mmol/L (ref 22–32)
Calcium: 8.5 mg/dL — ABNORMAL LOW (ref 8.9–10.3)
Chloride: 100 mmol/L (ref 98–111)
Creatinine, Ser: 1.98 mg/dL — ABNORMAL HIGH (ref 0.44–1.00)
GFR calc Af Amer: 28 mL/min — ABNORMAL LOW (ref >60)
GFR calc non Af Amer: 24 mL/min — ABNORMAL LOW (ref >60)
Glucose, Bld: 157 mg/dL — ABNORMAL HIGH (ref 70–99)
Potassium: 3.9 mmol/L (ref 3.5–5.1)
Sodium: 137 mmol/L (ref 135–145)

## 2019-01-08 MED ORDER — SODIUM CHLORIDE 0.9 % IV SOLN
INTRAVENOUS | Status: DC
  Administered 2019-01-08: 17:00:00 via INTRAVENOUS

## 2019-01-08 NOTE — Progress Notes (Signed)
Daily Progress Note   [name]       Date: 01/08/2019 DOB: Mar 10, 1941  Age: 78 y.o. MRN#: [id number] Attending Physician: Caren Griffins, MD Primary Care Physician: Everardo Beals, NP Admit Date: 12/24/2018  Reason for Consultation/Follow-up: Establishing goals of care  Subjective: Patient is awake, alert, resting in bed. She is asking for the nurse tech to help her with her telephone.   Does not appear to be in distress, call placed and discussed with [name] see below.   Length of Stay: 15  Current Medications: Scheduled Meds:  . aspirin EC  81 mg Oral Daily  . atorvastatin  80 mg Oral q1800  . clopidogrel  75 mg Oral Daily  . ferrous sulfate  325 mg Oral Q M,W,F  . heparin  5,000 Units Subcutaneous Q8H  . hydrALAZINE  25 mg Oral Q8H  . isosorbide mononitrate  60 mg Oral Daily  . metoprolol tartrate  25 mg Oral BID  . multivitamin with minerals  1 tablet Oral Daily  . nystatin  5 mL Oral QID  . pantoprazole  40 mg Oral Daily  . QUEtiapine  25 mg Oral BID  . thiamine  100 mg Oral Daily  . valproic acid  500 mg Oral BID    Continuous Infusions:   PRN Meds: haloperidol **OR** haloperidol lactate, hydrALAZINE, temazepam  Physical Exam         Awake alert  No edema Regular work of breathing Abdomen is not distended  Vital Signs: BP 118/75 (BP Location: Right Arm)   Pulse 74   Temp 97.7 F (36.5 C) (Oral)   Resp 18   Ht 5' 2.99" (1.6 m)   Wt 41.6 kg   SpO2 98%   BMI 16.25 kg/m  SpO2: SpO2: 98 % O2 Device: O2 Device: Room Air O2 Flow Rate:    Intake/output summary:   Intake/Output Summary (Last 24 hours) at 01/08/2019 1231 Last data filed at 01/08/2019 0900 Gross per 24 hour  Intake 200 ml  Output -  Net 200 ml   LBM:  Last BM Date: 01/07/19 Baseline Weight: Weight: 41.6 kg Most recent weight: Weight: 41.6 kg       Palliative Assessment/Data:    Flowsheet Rows     Most Recent Value  Intake Tab  Referral  Department  Hospitalist  Unit at Time of Referral  Med/Surg Unit  Palliative Care Primary Diagnosis  Sepsis/Infectious Disease  Date Notified  12/28/18  Palliative Care Type  New Palliative care  Reason for referral  Clarify Goals of Care  Date of Admission  12/24/18  Date first seen by Palliative Care  12/29/18  # of days Palliative referral response time  1 Day(s)  # of days IP prior to Palliative referral  4  Clinical Assessment  Palliative Performance Scale Score  50%  Psychosocial & Spiritual Assessment  Palliative Care Outcomes  Patient/Family meeting held?  Yes  Who was at the meeting?  Son via phone      Patient Active Problem List   Diagnosis Date Noted  . Palliative care by specialist   . Goals of care, counseling/discussion   . Altered mental status   . Encephalopathy, toxic 12/24/2018  . Aspiration pneumonia of both lower lobes due to gastric secretions ([place]) 12/24/2018  . Non-ST elevation (NSTEMI) myocardial infarction ([place])   . Chest pain   . Dyspnea on exertion   . Elevated troponin level 12/09/2018  . Chronic migraine 03/27/2018  . Lumbar radiculopathy 03/27/2018  . Gait abnormality 03/27/2018  . CKD (chronic kidney disease) stage 4, GFR 15-29 ml/min (HCC) 12/28/2016  . Normocytic anemia 12/28/2016  . Chronic pain syndrome 12/28/2016  . Benzodiazepine withdrawal ([place]) 12/28/2016  . Benzodiazepine withdrawal without complication ([place]) 35/70/1779  . Chronic right shoulder pain 12/06/2016  . Fall 09/17/2016  . Fracture of humeral head, closed, right, initial encounter 09/17/2016  . Rib fractures 09/17/2016  . Multiple falls 09/17/2016  . Severe muscle deconditioning 09/17/2016  . Failure to thrive in adult 09/17/2016  . Melena 04/25/2016  . Essential  hypertension 04/25/2016  . Anxiety state 04/25/2016  . Tobacco abuse 04/25/2016  . Hyperlipidemia 04/25/2016  . Syncope 04/24/2016  . Syncope and collapse 04/24/2016  . Gait difficulty 01/12/2016  . Foot drop, left 01/12/2016  . Severe recurrent major depression without psychotic features ([place]) 08/28/2015  . Spondylolisthesis at L4-L5 level 07/30/2015  . PVD (peripheral vascular disease) with claudication ([place]) 07/29/2014  . Weakness-Bilateral arm/leg 07/29/2014  . Numbness-left leg 07/29/2014  . Swelling of limb-Legs 07/29/2014  . Atherosclerosis of native arteries of the extremities with intermittent claudication 09/30/2011    Palliative Care Assessment & Plan   Patient Profile:  78 year old female with history of stage IV chronic kidney disease, peripheral vascular disease status post femorofemoral bypass in 2013, chronic pain syndrome, hypertension, bipolar disorder, diabetes mellitus (new diagnosis) and ongoing tobacco use who was hospitalized recently (2 weeks back) with NSTEMI, seen by cardiology and recommended conservative management with aspirin, Plavix, statin and Imdur. Patient was discharged to SNF on 2/26. She was brought to the ED on 3/8 after frequent falls at the facility and increased confusion  Assessment:  recurrent risk of aspiration Recent NSTEMI Stage IV CKD Deconditioning, generalized weakness Poor PO intake.  Agitation. PPS 30% Recent blood work with high likelihood of progressive renal failure.   Recommendations/Plan:  continue psych medications Continue to encourage PO Continue current mode of care Agree with DNR Call placed and discussed with [name], who stated that he is the POA, the patient lives with her long term boyfriend, [name] called him his step-dad, but they're not married. [name] states that he has discussed with CSW here, he has elected for home with home health services.   I have discussed with him about considering home with hospice  support.  I have frankly discussed with him that the patient has gradually progressive decline, especially from a renal disease standpoint. [name] simply states, "Hospice is for dying people, I don't think she is dying."  I have discussed with him about having out patient palliative care follow up with the patient, at least as an extra layer of support, to monitor her disease trajectory in the outpatient setting and to help navigate choices for when family will be ready for hospice. He is in agreement.   At this point, recommend home with home health and out patient palliative care to follow.        Code Status:    Code Status Orders  (From admission, onward)         Start     Ordered   12/31/18 1359  Do not attempt resuscitation (DNR)  Continuous    Question Answer Comment  In the event of cardiac or respiratory ARREST Do not call a "code blue"   In the event of cardiac or respiratory ARREST Do not perform Intubation, CPR, defibrillation or ACLS   In the event of cardiac or respiratory ARREST Use medication by any route, position, wound care, and other measures to relive pain and suffering. May use oxygen, suction and manual treatment of airway obstruction as needed for comfort.      12/31/18 1400        Code Status History    Date Active Date Inactive Code Status Order ID Comments User Context   12/24/2018 1641 12/31/2018 1400 Full Code [id number]  Karie Kirks, DO ED   12/10/2018 1837 12/13/2018 1920 DNR [id number]  Adrian Prows, MD Inpatient   12/09/2018 1647 12/10/2018 1836 Full Code [id number]  Truett Mainland, DO Inpatient   06/23/2017 1811 06/24/2017 1915 Full Code [id number]  Doreatha Lew, MD ED   06/14/2017 1209 06/14/2017 1824 Full Code [id number]  Nigel Mormon, MD Inpatient   12/28/2016 0016 12/29/2016 2137 Full Code [id number]  Edwin Dada, MD ED   09/17/2016 2114 09/20/2016 2340 Full Code [id number]  Roney Jaffe, MD Inpatient   04/24/2016 2321 04/26/2016 1845 Full Code  [id number]  Norval Morton, MD ED   08/27/2015 2101 08/30/2015 1456 Full Code [id number]  Harvel Quale, MD ED   08/26/2015 1802 08/27/2015 0345 Full Code [id number]  Voncille Lo, MD ED   08/01/2015 1727 08/04/2015 1656 Full Code [id number]  Kary Kos, MD Inpatient   07/30/2015 1848 08/01/2015 1727 Full Code [id number]  Kary Kos, MD ED       Prognosis:   guarded.    Discharge Planning:  Home with home health and out patient palliative,  as per patient's family preference.   Care plan was discussed with    [name] on the phone. Also discussed with TRH MD.   Thank you for allowing the Palliative Medicine Team to assist in the care of this patient.   Time In: 12 Time Out: 1225 Total Time 25 Prolonged Time Billed  no       Greater than 50%  of this time was spent counseling and coordinating care related to the above assessment and plan.  Loistine Chance, MD [phone] Please contact Palliative Medicine Team phone at [phone] for questions and concerns.

## 2019-01-08 NOTE — Progress Notes (Signed)
PROGRESS NOTE  Alyssa [name] MVE:[id number] DOB: 09-Sep-1941 DOA: 12/24/2018 PCP: Everardo Beals, NP   LOS: 15 days   Brief Narrative / Interim history: 78 year old female with history of stage IV chronic kidney disease, peripheral vascular disease status post femorofemoral bypass in 2013, chronic pain syndrome, hypertension, bipolar disorder, diabetes mellitus (new diagnosis) and ongoing tobacco use who was hospitalized recently (2 weeks back) with NSTEMI, seen by cardiology and recommended conservative management with aspirin, Plavix, statin and Imdur. Patient was discharged to SNF on 2/26. She was brought to the ED on 3/8 after frequent falls at the facility and increased confusion. In the ED she was found to be agitated, confused and required a sitter for safety. She was afebrile and vitals were stable except for mildly elevated systolic blood pressure. Sats dropped down quickly to 78%. Blood work showed elevated RBC of 13. 9K, elevated platelets of 602 with chest x-ray showing left basilar infiltrate and cardiomegaly. CT of the head and cervical spine without acute findings. EKG showed prolonged QTC. Patient admitted for acute toxic metabolic encephalopathy with acute hypoxic respiratory failure secondary to healthcare associated pneumonia and possible UTI.  Subjective: -alert this morning, knows that she is in the hospital, knows the year but doesn't know the president   Assessment & Plan: Principal Problem:   Altered mental status Active Problems:   Failure to thrive in adult   CKD (chronic kidney disease) stage 4, GFR 15-29 ml/min (HCC)   Chronic pain syndrome   Dyspnea on exertion   Encephalopathy, toxic   Aspiration pneumonia of both lower lobes due to gastric secretions ([place])   Palliative care by specialist   Goals of care, counseling/discussion   Principal Problem Acute hypoxic respiratory failure due to H CAP versus aspiration pneumonia -Patient was placed  on antibiotics and finished a course, currently off -On room air this morning, clinically stable -Repeat chest x-ray showed improvement in pneumonia, all cultures remained negative  Active Problems Acute toxic metabolic encephalopathy with agitation -Psychiatry consulted, appreciate input, patient placed on valproic acid and Restoril -Seroquel was added with improvement, now no longer requiring sitter -Continue supportive treatment -Suspect related to recurrent hospitalization,?  Mild underlying cognitive impairment at baseline, at 1 point will benefit from outpatient neuropsychiatric testing -much improved in the past 1-2 days   Disposition -Patient does not have history of multiple hospitalizations in the past and mostly her care was done through outpatient work and lived at home.  She does have a history of peripheral neuropathy for which she has seen neurosurgery (with history of lumbar decompression surgery in October 2016) as well as neurology most recently in October 2019.  She was also experiencing right-sided headaches, gait abnormalities and low back pain radiating to bilateral lower extremities. She had a hospitalization in February 2020 for NSTEMI for which conservative management was recommended.  She was discharged on 2/26 at SNF for rehab and was readmitted roughly 10 days later for acute encephalopathy thought to be due to pneumonia.   After face to face evaluation by this MD and bedside clinical assessment it is my opinion that her short stay in rehab between these 2 hospitalizations may have not given her enough time to achieve full potential. This patient needs rehab services for at least 5 days per week and skilled nursing services daily to facilitate this transition. Discussed with the husband as well who confirms that prior to February she was independent and ambulatory. He denies any significant cognitive deficits prior.   -  Did peer to peer 01/06/2019, however patient's  insurance [employer] denied SNF coverage based on chart review alone, despite PT notes 3/22 stating that the patient is "Progressing with mobility. Multimodal cueing required. Pt participated well on today. She is easily redirected when necessary. Continue to recommend SNF for rehab. "  Stage IV chronic kidney disease -Creatinine at baseline, repeat BMP this morning   Hypernatremia -Likely from free water deficits this patient has minimal oral intake, BMP today pending   Non-STEMI -Continue aspirin Plavix, conservative management recommended by cardiology  Bipolar disorder -Continue Depakote  Hypokalemia and hypomagnesemia -Replete, now stable  Hypertension -Controlled  Hyperlipidemia -Continue statin  Anemia of chronic kidney disease -Hemoglobin stable   Scheduled Meds: . aspirin EC  81 mg Oral Daily  . atorvastatin  80 mg Oral q1800  . clopidogrel  75 mg Oral Daily  . ferrous sulfate  325 mg Oral Q M,W,F  . heparin  5,000 Units Subcutaneous Q8H  . hydrALAZINE  25 mg Oral Q8H  . isosorbide mononitrate  60 mg Oral Daily  . metoprolol tartrate  25 mg Oral BID  . multivitamin with minerals  1 tablet Oral Daily  . nystatin  5 mL Oral QID  . pantoprazole  40 mg Oral Daily  . QUEtiapine  25 mg Oral BID  . thiamine  100 mg Oral Daily  . valproic acid  500 mg Oral BID   Continuous Infusions:  PRN Meds:.haloperidol **OR** haloperidol lactate, hydrALAZINE, temazepam  DVT prophylaxis: heparin Code Status: DNR Family Communication: No family present at bedside, d/w husband bedside 3/22 Disposition Plan: To be determined  Consultants:   Psychiatry  Palliative care  Procedures:   None   Antimicrobials:  None    Objective: Vitals:   01/06/19 1418 01/06/19 2234 01/07/19 1419 01/07/19 2035  BP: (!) 147/73 139/86 106/79 118/75  Pulse: 70 70 68 74  Resp: 16 17 16 18   Temp: 98.4 F (36.9 C) 98.3 F (36.8 C) 97.7 F (36.5 C) 97.7 F (36.5 C)   TempSrc: Oral Oral Oral Oral  SpO2: 100% 100% 100% 98%  Weight:      Height:        Intake/Output Summary (Last 24 hours) at 01/08/2019 0944 Last data filed at 01/08/2019 0900 Gross per 24 hour  Intake 680 ml  Output -  Net 680 ml   Filed Weights   12/24/18 1809  Weight: 41.6 kg    Examination:  Constitutional: no distress Respiratory: CTA biL, no wheezing or crackles  Cardiovascular: RRR Neuro: non focal, follows commands. Alert to place, year, person not to president or situation  Data Reviewed: I have independently reviewed following labs and imaging studies   CBC: Recent Labs  Lab 01/02/19 0609  WBC 9.9  HGB 9.1*  HCT 28.7*  MCV 85.7  PLT 063*   Basic Metabolic Panel: Recent Labs  Lab 01/02/19 0609  NA 137  K 3.7  CL 97*  CO2 31  GLUCOSE 115*  BUN 11  CREATININE 1.49*  CALCIUM 8.6*   GFR: Estimated Creatinine Clearance: 21.4 mL/min (A) (by C-G formula based on SCr of 1.49 mg/dL (H)). Liver Function Tests: No results for input(s): AST, ALT, ALKPHOS, BILITOT, PROT, ALBUMIN in the last 168 hours. No results for input(s): LIPASE, AMYLASE in the last 168 hours. No results for input(s): AMMONIA in the last 168 hours. Coagulation Profile: No results for input(s): INR, PROTIME in the last 168 hours. Cardiac Enzymes: No results for input(s): CKTOTAL,  CKMB, CKMBINDEX, TROPONINI in the last 168 hours. BNP (last 3 results) No results for input(s): PROBNP in the last 8760 hours. HbA1C: No results for input(s): HGBA1C in the last 72 hours. CBG: No results for input(s): GLUCAP in the last 168 hours. Lipid Profile: No results for input(s): CHOL, HDL, LDLCALC, TRIG, CHOLHDL, LDLDIRECT in the last 72 hours. Thyroid Function Tests: No results for input(s): TSH, T4TOTAL, FREET4, T3FREE, THYROIDAB in the last 72 hours. Anemia Panel: No results for input(s): VITAMINB12, FOLATE, FERRITIN, TIBC, IRON, RETICCTPCT in the last 72 hours. Urine analysis:    Component  Value Date/Time   COLORURINE YELLOW 12/24/2018 1311   APPEARANCEUR HAZY (A) 12/24/2018 1311   LABSPEC 1.011 12/24/2018 1311   PHURINE 6.0 12/24/2018 1311   GLUCOSEU NEGATIVE 12/24/2018 1311   HGBUR SMALL (A) 12/24/2018 1311   BILIRUBINUR NEGATIVE 12/24/2018 1311   KETONESUR NEGATIVE 12/24/2018 1311   PROTEINUR 100 (A) 12/24/2018 1311   UROBILINOGEN 0.2 08/24/2015 1737   NITRITE NEGATIVE 12/24/2018 1311   LEUKOCYTESUR MODERATE (A) 12/24/2018 1311   Sepsis Labs: Invalid input(s): PROCALCITONIN, LACTICIDVEN  No results found for this or any previous visit (from the past 240 hour(s)).    Radiology Studies: No results found.   Marzetta Board, MD, PhD [facility]  Contact via  [email or web address]  [place] P: [phone]  F: [phone]

## 2019-01-08 NOTE — TOC Progression Note (Signed)
Transition of Care [facility]) - Progression Note    Patient Details  Name: Alyssa [name] MRN: [id number] Date of Birth: Jul 11, 1941  [facility]) CM/SW Contact  Leeroy Cha, RN Phone Number: 01/08/2019, 1:09 PM  Clinical Narrative:    hhc -RN,PT [place] through [facility] for [facility] Medicare   Expected Discharge Plan: [place] Barriers to Discharge: No Barriers Identified  Expected Discharge Plan and Services Expected Discharge Plan: [place] In-house Referral: Clinical Social Work Discharge Planning Services: CM Consult Post Acute Care Choice: [place] arrangements for the past 2 months: Single Family Home                     HH Arranged: RN, PT, OT HH Agency: [facility]   Social Determinants of Health (SDOH) Interventions    Readmission Risk Interventions Readmission Risk Prevention Plan 12/26/2018  Transportation Screening Complete  Medication Review (RN Care Manager) Complete  PCP or Specialist appointment within 3-5 days of discharge Not Complete  PCP/Specialist Appt Not Complete comments pt for snf. Snf MD to see as needed.  [place] or Home Care Consult Not Complete  HRI or Home Care Consult Pt Refusal Comments NA  SW Recovery Care/Counseling Consult Not Complete  SW Consult Not Complete Comments NA  Palliative Care Screening Complete  Skilled Nursing Facility Complete  Some recent data might be hidden

## 2019-01-09 DIAGNOSIS — R0609 Other forms of dyspnea: Secondary | ICD-10-CM

## 2019-01-09 LAB — BASIC METABOLIC PANEL
Anion gap: 9 (ref 5–15)
BUN: 27 mg/dL — ABNORMAL HIGH (ref 8–23)
CO2: 25 mmol/L (ref 22–32)
Calcium: 8.5 mg/dL — ABNORMAL LOW (ref 8.9–10.3)
Chloride: 105 mmol/L (ref 98–111)
Creatinine, Ser: 1.82 mg/dL — ABNORMAL HIGH (ref 0.44–1.00)
GFR calc Af Amer: 31 mL/min — ABNORMAL LOW (ref >60)
GFR calc non Af Amer: 27 mL/min — ABNORMAL LOW (ref >60)
Glucose, Bld: 98 mg/dL (ref 70–99)
Potassium: 3.9 mmol/L (ref 3.5–5.1)
Sodium: 139 mmol/L (ref 135–145)

## 2019-01-09 MED ORDER — VALPROIC ACID 250 MG PO CAPS: 500.0000 mg | ORAL_CAPSULE | Freq: Two times a day (BID) | ORAL | 0 refills | Status: DC

## 2019-01-09 MED ORDER — ASPIRIN 81 MG PO TBEC: 81.0000 mg | DELAYED_RELEASE_TABLET | Freq: Every day | ORAL | 1 refills | Status: DC

## 2019-01-09 MED ORDER — HYDRALAZINE HCL 25 MG PO TABS: 25.0000 mg | ORAL_TABLET | Freq: Three times a day (TID) | ORAL | 1 refills | Status: DC

## 2019-01-09 MED ORDER — ROSUVASTATIN CALCIUM 40 MG PO TABS: 40.0000 mg | ORAL_TABLET | Freq: Every day | ORAL | 1 refills | Status: DC

## 2019-01-09 MED ORDER — TRAMADOL HCL 50 MG PO TABS: 50.0000 mg | ORAL_TABLET | Freq: Four times a day (QID) | ORAL | 0 refills | Status: DC | PRN

## 2019-01-09 MED ORDER — CLOPIDOGREL BISULFATE 75 MG PO TABS: 75.0000 mg | ORAL_TABLET | Freq: Every day | ORAL | 1 refills | Status: DC

## 2019-01-09 MED ORDER — METOPROLOL TARTRATE 25 MG PO TABS: 25.0000 mg | ORAL_TABLET | Freq: Two times a day (BID) | ORAL | 1 refills | Status: DC

## 2019-01-09 MED ORDER — CITALOPRAM HYDROBROMIDE 40 MG PO TABS: 40.0000 mg | ORAL_TABLET | Freq: Every day | ORAL | 1 refills | Status: DC

## 2019-01-09 MED ORDER — ISOSORBIDE MONONITRATE ER 60 MG PO TB24: 60.0000 mg | ORAL_TABLET | Freq: Every day | ORAL | 1 refills | Status: DC

## 2019-01-09 MED ORDER — TEMAZEPAM 15 MG PO CAPS: 15.0000 mg | ORAL_CAPSULE | Freq: Every evening | ORAL | 0 refills | Status: DC | PRN

## 2019-01-09 MED ORDER — VALPROIC ACID 250 MG PO CAPS: 250.0000 mg | ORAL_CAPSULE | Freq: Two times a day (BID) | ORAL | 0 refills | Status: DC

## 2019-01-09 MED ORDER — CLONAZEPAM 1 MG PO TABS: 0.5000 mg | ORAL_TABLET | Freq: Two times a day (BID) | ORAL | 0 refills | Status: DC | PRN

## 2019-01-09 NOTE — Discharge Instructions (Signed)
Follow with Alyssa Beals, NP in 5-7 days  Please get a complete blood count and chemistry panel checked by your Primary MD at your next visit, and again as instructed by your Primary MD. Please get your medications reviewed and adjusted by your Primary MD.  Please request your Primary MD to go over all Hospital Tests and Procedure/Radiological results at the follow up, please get all Hospital records sent to your Prim MD by signing hospital release before you go home.  In some cases, there will be blood work, cultures and biopsy results pending at the time of your discharge. Please request that your primary care M.D. goes through all the records of your hospital data and follows up on these results.  If you had Pneumonia of Lung problems at the Hospital: Please get a 2 view Chest X ray done in 6-8 weeks after hospital discharge or sooner if instructed by your Primary MD.  If you have Congestive Heart Failure: Please call your Cardiologist or Primary MD anytime you have any of the following symptoms:  1) 3 pound weight gain in 24 hours or 5 pounds in 1 week  2) shortness of breath, with or without a dry hacking cough  3) swelling in the hands, feet or stomach  4) if you have to sleep on extra pillows at night in order to breathe  Follow cardiac low salt diet and 1.5 lit/day fluid restriction.  If you have diabetes Accuchecks 4 times/day, Once in AM empty stomach and then before each meal. Log in all results and show them to your primary doctor at your next visit. If any glucose reading is under 80 or above 300 call your primary MD immediately.  If you have Seizure/Convulsions/Epilepsy: Please do not drive, operate heavy machinery, participate in activities at heights or participate in high speed sports until you have seen by Primary MD or a Neurologist and advised to do so again.  If you had Gastrointestinal Bleeding: Please ask your Primary MD to check a complete blood count within  one week of discharge or at your next visit. Your endoscopic/colonoscopic biopsies that are pending at the time of discharge, will also need to followed by your Primary MD.  Get Medicines reviewed and adjusted. Please take all your medications with you for your next visit with your Primary MD  Please request your Primary MD to go over all hospital tests and procedure/radiological results at the follow up, please ask your Primary MD to get all Hospital records sent to his/her office.  If you experience worsening of your admission symptoms, develop shortness of breath, life threatening emergency, suicidal or homicidal thoughts you must seek medical attention immediately by calling 911 or calling your MD immediately  if symptoms less severe.  You must read complete instructions/literature along with all the possible adverse reactions/side effects for all the Medicines you take and that have been prescribed to you. Take any new Medicines after you have completely understood and accpet all the possible adverse reactions/side effects.   Do not drive or operate heavy machinery when taking Pain medications.   Do not take more than prescribed Pain, Sleep and Anxiety Medications  Special Instructions: If you have smoked or chewed Tobacco  in the last 2 yrs please stop smoking, stop any regular Alcohol  and or any Recreational drug use.  Wear Seat belts while driving.  Please note You were cared for by a hospitalist during your hospital stay. If you have any questions about your discharge  medications or the care you received while you were in the hospital after you are discharged, you can call the unit and asked to speak with the hospitalist on call if the hospitalist that took care of you is not available. Once you are discharged, your primary care physician will handle any further medical issues. Please note that NO REFILLS for any discharge medications will be authorized once you are discharged, as it is  imperative that you return to your primary care physician (or establish a relationship with a primary care physician if you do not have one) for your aftercare needs so that they can reassess your need for medications and monitor your lab values.  You can reach the hospitalist office at phone [phone] or fax [phone]   If you do not have a primary care physician, you can call [phone] for a physician referral.  Activity: As tolerated with Full fall precautions use walker/cane & assistance as needed    Diet: regular  Disposition Home

## 2019-01-09 NOTE — Progress Notes (Signed)
Physical Therapy Treatment Patient Details Name: Alyssa [name] MRN: [id number] DOB: 1941/07/05 Today's Date: 01/09/2019    History of Present Illness 78 yo female admitted to ED on 3/8 with multiple falls, AMS due to suspected UTI. PMH includes anxiety, OA, chest pain with NSTEMI 12/13/18, CKD, HLD, HTN, peripheral neuropathy, PVD, chronic pain, FTT, depression, L foot drop, R LLD, back surgery, femoral bypass graft, bipolar disorder, R THA.     PT Comments    Pt continues to participate well with therapy. Multimodal cueing required during session. She followed 1 step commands fairly well on today. Feel pt could benefit from ST rehab at SNF to improve strength, balance, and overall functional mobility however it appears insurance has denied placement at this time. Recommend HHPT and 24 hour supervision if pt returns home.    Follow Up Recommendations  SNF     Equipment Recommendations  None recommended by PT    Recommendations for Other Services       Precautions / Restrictions Precautions Precautions: Fall Precaution Comments: Multiples falls in the past few weeks (advanced dementia) Restrictions Weight Bearing Restrictions: No    Mobility  Bed Mobility Overal bed mobility: Needs Assistance Bed Mobility: Sit to Supine       Sit to supine: Min assist;HOB elevated   General bed mobility comments: Assist for LEs. Multimodal cueing required.   Transfers Overall transfer level: Needs assistance Equipment used: Rolling walker (2 wheeled) Transfers: Sit to/from Stand Sit to Stand: Min assist         General transfer comment: Assist to rise, stabilize, control descent, maneuver safety with RW, navigate obstacles. Multimodal cueing required.   Ambulation/Gait Ambulation/Gait assistance: Min assist Gait Distance (Feet): 60 Feet Assistive device: Rolling walker (2 wheeled) Gait Pattern/deviations: Step-through pattern;Decreased stride length     General Gait  Details: Assist to stabilize pt, maneuver safely with RW, navigate obstacles. Pt tolerated distance well. Multimodal cueing required.    Stairs             Wheelchair Mobility    Modified Rankin (Stroke Patients Only)       Balance Overall balance assessment: Needs assistance         Standing balance support: Bilateral upper extremity supported Standing balance-Leahy Scale: Poor                              Cognition Arousal/Alertness: Awake/alert Behavior During Therapy: Flat affect Overall Cognitive Status: Impaired/Different from baseline Area of Impairment: Orientation;Following commands;Safety/judgement;Problem solving                 Orientation Level: Disoriented to;Time;Place;Situation     Following Commands: Follows one step commands inconsistently Safety/Judgement: Decreased awareness of deficits;Decreased awareness of safety   Problem Solving: Difficulty sequencing;Requires tactile cues;Requires verbal cues        Exercises      General Comments        Pertinent Vitals/Pain Pain Assessment: No/denies pain    Home Living                      Prior Function            PT Goals (current goals can now be found in the care plan section) Progress towards PT goals: Progressing toward goals    Frequency    Min 2X/week      PT Plan Current plan remains appropriate    Co-evaluation  AM-PAC PT "6 Clicks" Mobility   Outcome Measure  Help needed turning from your back to your side while in a flat bed without using bedrails?: A Little Help needed moving from lying on your back to sitting on the side of a flat bed without using bedrails?: A Little Help needed moving to and from a bed to a chair (including a wheelchair)?: A Little Help needed standing up from a chair using your arms (e.g., wheelchair or bedside chair)?: A Little Help needed to walk in hospital room?: A Little Help needed  climbing 3-5 steps with a railing? : A Lot 6 Click Score: 17    End of Session Equipment Utilized During Treatment: Gait belt Activity Tolerance: Patient tolerated treatment well Patient left: in bed;with call bell/phone within reach;with bed alarm set;with nursing/sitter in room   PT Visit Diagnosis: Other abnormalities of gait and mobility (R26.89);History of falling (Z91.81)     Time: 9794-8016 PT Time Calculation (min) (ACUTE ONLY): 10 min  Charges:  $Gait Training: 8-22 mins                        Weston Anna, PT Acute Rehabilitation Services Pager: [phone] Office: [phone]

## 2019-01-09 NOTE — TOC Transition Note (Signed)
Transition of Care [facility]) - CM/SW Discharge Note   Patient Details  Name: [name] MRN: [id number] Date of Birth: January 07, 1941  Transition of Care [facility]) CM/SW Contact:  Leeroy Cha, RN Phone Number: 01/09/2019, 9:22 AM   Clinical Narrative:    Discharged to home with hhc see progression note on [id number]   Final next level of care: [place] Barriers to Discharge: No Barriers Identified   Patient Goals and CMS Choice Patient states their goals for this hospitalization and ongoing recovery are:: Unable to state. Confused CMS Medicare.gov Compare Post Acute Care list provided to:: Other (Comment Required)(adult children) Choice offered to / list presented to : Adult Children  Discharge Placement                       Discharge Plan and Services In-house Referral: Clinical Social Work Discharge Planning Services: CM Consult Post Acute Care Choice: Home Health              HH Arranged: RN, PT, OT [facility] Agency: [facility]   Social Determinants of Health (SDOH) Interventions     Readmission Risk Interventions Readmission Risk Prevention Plan 12/26/2018  Transportation Screening Complete  Medication Review (RN Care Manager) Complete  PCP or Specialist appointment within 3-5 days of discharge Not Complete  PCP/Specialist Appt Not Complete comments pt for snf. Snf MD to see as needed.  [place] or Home Care Consult Not Complete  HRI or Home Care Consult Pt Refusal Comments NA  SW Recovery Care/Counseling Consult Not Complete  SW Consult Not Complete Comments NA  Palliative Care Screening Complete  Skilled Nursing Facility Complete  Some recent data might be hidden

## 2019-01-09 NOTE — Discharge Summary (Signed)
Physician Discharge Summary  JODILYN GIESE RWE:[id number] DOB: [birth year] DOA: 12/24/2018  PCP: Everardo Beals, NP  Admit date: 12/24/2018 Discharge date: 01/09/2019  Admitted From: SNF Disposition:  Home (insurance denied short term rehab)  Recommendations for Outpatient Follow-up:  1. Follow up with PCP in 1 week 2. Please obtain BMP/CBC in one week  Home Health: PT, RN, aide Equipment/Devices: none  Discharge Condition: stable CODE STATUS: DNR Diet recommendation: dysphagia 1   HPI: Per admitting MD, The patient is a 78 yr old woman who was discharged from a 5 day inpatient stay at [facility] to SNF on 12/13/2018. She is sent to the ED at [facility] today be the facility for the above chief complaints. She has been falling a lot in the last 2 days. She is confused and somewhat agitated. The facility had concerns that she had a UTI and/or pneumonia. The patient carries a past medical history significant for CKD IV, chronic pain, hypertension, hyperlipidemia, bipolar disorder, and peripheral vascular disease. The patient had been evaluated and treated for chest pain with elevated troponin in [facility]. She was found to have suffered an NSTEMI for which cardiology had recommended conservative management. She was discharged on ASA, Plavix, Imdur, and an increased dose of Crestor. In the ED the patient was agitated, confused, and required a sitter for her safety. Temperature was 98.1. Respiratory rate was 18. Heart rate was 57. Blood pressure was 168/73. She was saturating 100% on room air. This quickly dropped to 78%. Sodium was 141, potassium was 4.6. CO2 was 22. Creatinine was 1.87. LFT's were within normal limits. Lactic acid was 0.9. WBC was 13.9. Hemoglobin was 9.7. Hematocrit was 30.5. Platelets are elevated at 602. CXR demonstrates a left basilar infiltrate and cardiomegaly. CT head and C- spine demonstrated no evidence of acute intracranial or cervical spine injury. EKG  demonstrated no ischemic changes, prolonged QT in the settin gof normal sinus rhythm.  Hospital Course: Principal Problem Acute hypoxic respiratory failure due to H CAP versus aspiration pneumonia -Patient was placed on antibiotics and finished a course, currently off antibiotics, has remained clinically stable, on room air, and repeat chest x-ray showed improvement.  All cultures have remained negative.  Active Problems Acute toxic metabolic encephalopathy with agitation -Psychiatry consulted, appreciate input, patient placed on valproic acid and Restoril, continue.  She is to resume her home medications prior to first hospitalization.  Strongly advised for outpatient follow-up with primary MD within 1 week.  Mental status is much improved, she will be discharged home and expect continued improvement.  Suspect mild underlying dementia at baseline.  She would benefit from outpatient neuropsychiatric testing Disposition -despite strong recommendation from the inpatient medical team for patient to go to short-term rehab, insurance denied SNF placement.  She will be discharged home with home health physical therapy, RN, aide. Stage IV chronic kidney disease -Baseline creatinine ranging between 1.3 and 2.1 in the last 3 years, currently at 1.8 and appears at baseline Hypernatremia -Likely from free water deficits, sodium has remained stable while being off IV fluids for several days Recent non-STEMI in February-Continue aspirin Plavix, conservative management recommended by cardiology.  Continue Imdur, metoprolol Bipolar disorder -Continue Depakote, off lithium Hypokalemia and hypomagnesemia -Replete, now stable Hypertension -Controlled, on metoprolol, hydralazine Hyperlipidemia -Continue statin Anemia of chronic kidney disease -Hemoglobin stable   Discharge Diagnoses:  Principal Problem:   Altered mental status Active Problems:   Failure to thrive in adult   CKD (chronic kidney disease) stage  4, GFR 15-29 ml/min (HCC)   Chronic pain syndrome   Dyspnea on exertion   Encephalopathy, toxic   Aspiration pneumonia of both lower lobes due to gastric secretions [facility])   Palliative care by specialist   Goals of care, counseling/discussion     Discharge Instructions   Allergies as of 01/09/2019   No Known Allergies     Medication List    STOP taking these medications   ciprofloxacin 500 MG tablet Commonly known as:  CIPRO   lithium 300 MG tablet   sodium polystyrene 15 GM/60ML suspension Commonly known as:  KAYEXALATE     TAKE these medications   aspirin 81 MG EC tablet Take 1 tablet (81 mg total) by mouth daily.   citalopram 40 MG tablet Commonly known as:  CeleXA Take 1 tablet (40 mg total) by mouth daily.   clonazePAM 1 MG tablet Commonly known as:  KLONOPIN Take 0.5 tablets (0.5 mg total) by mouth 2 (two) times daily as needed for anxiety. What changed:    how much to take  when to take this  reasons to take this   clopidogrel 75 MG tablet Commonly known as:  PLAVIX Take 1 tablet (75 mg total) by mouth daily.   ergocalciferol 1.25 MG (50000 UT) capsule Commonly known as:  VITAMIN D2 Take 50,000 Units by mouth every Monday.   ferrous sulfate 325 (65 FE) MG tablet Take 1 tablet (325 mg total) by mouth every Monday, Wednesday, and Friday. With breakfast   gabapentin 300 MG capsule Commonly known as:  NEURONTIN Take 1 capsule (300 mg total) by mouth 3 (three) times daily.   hydrALAZINE 25 MG tablet Commonly known as:  APRESOLINE Take 1 tablet (25 mg total) by mouth 3 (three) times daily.   isosorbide mononitrate 60 MG 24 hr tablet Commonly known as:  IMDUR Take 1 tablet (60 mg total) by mouth daily.   metoprolol tartrate 25 MG tablet Commonly known as:  LOPRESSOR Take 1 tablet (25 mg total) by mouth 2 (two) times daily.   omeprazole 40 MG capsule Commonly known as:  PRILOSEC Take 1 capsule (40 mg total) by mouth daily.   rosuvastatin 40  MG tablet Commonly known as:  CRESTOR Take 1 tablet (40 mg total) by mouth daily.   temazepam 15 MG capsule Commonly known as:  RESTORIL Take 1 capsule (15 mg total) by mouth at bedtime as needed for sleep. What changed:    medication strength  how much to take  when to take this  reasons to take this   traMADol 50 MG tablet Commonly known as:  ULTRAM Take 1 tablet (50 mg total) by mouth every 6 (six) hours as needed for moderate pain.   valproic acid 250 MG capsule Commonly known as:  DEPAKENE Take 1 capsule (250 mg total) by mouth 2 (two) times daily.       Contact information for follow-up providers    Everardo Beals, NP. Schedule an appointment as soon as possible for a visit in 1 week(s).   Contact information: [address] [place]  [id number] [phone]            Contact information for after-discharge care    Destination    [facility] .   Service:  Skilled Nursing Contact information: [address] [place] [phone]                  Consultations:  Psychiatry   Procedures/Studies:  Dg Abd 1 View  Result Date: 12/10/2018 CLINICAL DATA:  Emesis. EXAM: ABDOMEN - 1 VIEW COMPARISON:  None. FINDINGS: Supine abdomen shows no gaseous bowel dilatation to suggest overt obstruction. Air is seen scattered along segments of the colon. Status post lumbar fusion. Right hip replacement noted. Coarse calcification over the central pelvis likely related to fibroid disease. IMPRESSION: Nonspecific bowel gas pattern. Electronically Signed   By: Misty Stanley M.D.   On: 12/10/2018 18:04   Ct Head Wo Contrast  Result Date: 12/24/2018 CLINICAL DATA:  Multiple falls and altered mental status EXAM: CT HEAD WITHOUT CONTRAST CT CERVICAL SPINE WITHOUT CONTRAST TECHNIQUE: Multidetector CT imaging of the head and cervical spine was performed following the standard protocol without intravenous  contrast. Multiplanar CT image reconstructions of the cervical spine were also generated. COMPARISON:  06/23/2017 FINDINGS: CT HEAD FINDINGS Brain: No evidence of acute infarction, hemorrhage, hydrocephalus, extra-axial collection or mass lesion/mass effect. Mild borderline moderate patchy low-density in the cerebral white matter attributed to chronic small vessel ischemia given vascular risk factors and stability. Mild for age cerebral volume loss Vascular: Atherosclerotic calcification Skull: Negative Sinuses/Orbits: Postoperative right ethmoid sinuses. Bilateral cataract resection. No acute finding CT CERVICAL SPINE FINDINGS Alignment: Degenerative anterolisthesis at C4-5 and C7-T1 measuring up to 4 mm. Skull base and vertebrae: No evidence of acute fracture. Marked motion degradation at the level of T2 and T3 Soft tissues and spinal canal: No gross canal hematoma or prevertebral edema. Atherosclerotic calcification. Disc levels: Generalized disc degeneration particularly severe at C7-T1 and T1-2 where there is disc collapse and sclerosis. Diffuse facet arthropathy most advanced at C7-T1 where there is joint distortion and anterolisthesis. Ankylosis has occurred at C6-7. Upper chest: Emphysema with motion. IMPRESSION: No evidence of acute intracranial or cervical spine injury. Electronically Signed   By: Monte Fantasia M.D.   On: 12/24/2018 15:29   Ct Cervical Spine Wo Contrast  Result Date: 12/24/2018 CLINICAL DATA:  Multiple falls and altered mental status EXAM: CT HEAD WITHOUT CONTRAST CT CERVICAL SPINE WITHOUT CONTRAST TECHNIQUE: Multidetector CT imaging of the head and cervical spine was performed following the standard protocol without intravenous contrast. Multiplanar CT image reconstructions of the cervical spine were also generated. COMPARISON:  06/23/2017 FINDINGS: CT HEAD FINDINGS Brain: No evidence of acute infarction, hemorrhage, hydrocephalus, extra-axial collection or mass lesion/mass effect.  Mild borderline moderate patchy low-density in the cerebral white matter attributed to chronic small vessel ischemia given vascular risk factors and stability. Mild for age cerebral volume loss Vascular: Atherosclerotic calcification Skull: Negative Sinuses/Orbits: Postoperative right ethmoid sinuses. Bilateral cataract resection. No acute finding CT CERVICAL SPINE FINDINGS Alignment: Degenerative anterolisthesis at C4-5 and C7-T1 measuring up to 4 mm. Skull base and vertebrae: No evidence of acute fracture. Marked motion degradation at the level of T2 and T3 Soft tissues and spinal canal: No gross canal hematoma or prevertebral edema. Atherosclerotic calcification. Disc levels: Generalized disc degeneration particularly severe at C7-T1 and T1-2 where there is disc collapse and sclerosis. Diffuse facet arthropathy most advanced at C7-T1 where there is joint distortion and anterolisthesis. Ankylosis has occurred at C6-7. Upper chest: Emphysema with motion. IMPRESSION: No evidence of acute intracranial or cervical spine injury. Electronically Signed   By: Monte Fantasia M.D.   On: 12/24/2018 15:29   Dg Chest Port 1 View  Result Date: 12/27/2018 CLINICAL DATA:  78 year old female with a history of current admission. EXAM: PORTABLE CHEST 1 VIEW COMPARISON:  12/24/2018, 12/11/2018 FINDINGS: Cardiomediastinal silhouette unchanged.  Left rotation. No pneumothorax,  new confluent airspace disease, or large pleural effusion. Coarsened interstitial markings bilateral lungs, similar to the prior. No displaced fracture. Similar appearance of chronic irregularity at the lateral left lower chest. IMPRESSION: Low lung volumes likely with atelectasis/scarring and no evidence lobar pneumonia or significant edema on the current plain film. Electronically Signed   By: Corrie Mckusick D.O.   On: 12/27/2018 16:18   Dg Chest Portable 1 View  Result Date: 12/24/2018 CLINICAL DATA:  Multiple falls and altered mental status. EXAM:  PORTABLE CHEST 1 VIEW COMPARISON:  12/11/2018 FINDINGS: Midline trachea. Cardiomegaly accentuated by AP portable technique. No pleural effusion or pneumothorax. Increase in left base airspace disease. High-riding humeral heads. IMPRESSION: 1. Increase in left base airspace disease, suspicious for infection or aspiration. 2. Cardiomegaly without congestive failure. Electronically Signed   By: Abigail Miyamoto M.D.   On: 12/24/2018 14:34   Dg Chest Port 1 View  Result Date: 12/11/2018 CLINICAL DATA:  Increased shortness of breath EXAM: PORTABLE CHEST 1 VIEW COMPARISON:  12/10/2018 FINDINGS: There is hazy left lower lobe airspace disease concerning for atelectasis versus pneumonia. There is no pleural effusion or pneumothorax. The heart and mediastinal contours are unremarkable. 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 IMPRESSION: Hazy left lower lobe airspace disease concerning for atelectasis versus pneumonia. Electronically Signed   By: Kathreen Devoid   On: 12/11/2018 11:21   Dg Chest Portable 1 View  Result Date: 12/10/2018 CLINICAL DATA:  Hypoxia EXAM: PORTABLE CHEST 1 VIEW COMPARISON:  Chest radiograph from one day prior. FINDINGS: Stable cardiomediastinal silhouette with normal heart size. No pneumothorax. No pleural effusion. No pulmonary edema. Mild hazy left lung base opacity. IMPRESSION: Mild hazy left lung base opacity, which could represent atelectasis, aspiration or pneumonia. Recommend short-term follow-up PA and lateral chest radiographs. Electronically Signed   By: Ilona Sorrel M.D.   On: 12/10/2018 18:04   Vas [place] Lower Extremity Venous (dvt)  Result Date: 12/12/2018  Lower Venous Study Indications: Pain.  Risk Factors: PAD. Performing Technologist: Toma Copier RVS  Examination Guidelines: A complete evaluation includes B-mode imaging, spectral Doppler, color Doppler, and power Doppler as needed of all accessible portions of each vessel. Bilateral testing is considered an  integral part of a complete examination. Limited examinations for reoccurring indications may be performed as noted.  Right Venous Findings: +---------+---------------+---------+-----------+----------+-------+            Compressibility Phasicity Spontaneity Properties Summary  +---------+---------------+---------+-----------+----------+-------+  CFV       Full            Yes       Yes                             +---------+---------------+---------+-----------+----------+-------+  SFJ       Full                                                      +---------+---------------+---------+-----------+----------+-------+  FV Prox   Full            Yes       Yes                             +---------+---------------+---------+-----------+----------+-------+  FV Mid    Full                                                      +---------+---------------+---------+-----------+----------+-------+  FV Distal Full            Yes       Yes                             +---------+---------------+---------+-----------+----------+-------+  PFV       Full            Yes       Yes                             +---------+---------------+---------+-----------+----------+-------+  POP       Full            Yes       Yes                             +---------+---------------+---------+-----------+----------+-------+  PTV       Full                                                      +---------+---------------+---------+-----------+----------+-------+  PERO      Full                                                      +---------+---------------+---------+-----------+----------+-------+  Left Venous Findings: +---------+---------------+---------+-----------+----------+-------------------+            Compressibility Phasicity Spontaneity Properties Summary              +---------+---------------+---------+-----------+----------+-------------------+  CFV       Full                                                                   +---------+---------------+---------+-----------+----------+-------------------+  SFJ       Full                                                                  +---------+---------------+---------+-----------+----------+-------------------+  FV Prox   Full                                                                  +---------+---------------+---------+-----------+----------+-------------------+  FV Mid    Full                                                                  +---------+---------------+---------+-----------+----------+-------------------+  FV Distal Full                                                                  +---------+---------------+---------+-----------+----------+-------------------+  PFV       Full                                                                  +---------+---------------+---------+-----------+----------+-------------------+  POP       Full                                                                  +---------+---------------+---------+-----------+----------+-------------------+  PTV       Full                                                                  +---------+---------------+---------+-----------+----------+-------------------+  PERO                                                       Unable to visualize                                                              well enough to                                                                   fully evaluate due                                                               to size of the vein  +---------+---------------+---------+-----------+----------+-------------------+    Summary: Right: There is no evidence of deep vein thrombosis in the lower extremity. No cystic structure found in the popliteal fossa. Left: There is no evidence of deep vein thrombosis in the lower extremity. No cystic structure found in the popliteal  fossa.  *See table(s) above for measurements and observations.  Electronically signed by Harold Barban MD on 12/12/2018 at 7:07:04 PM.    Final       Subjective: - no chest pain, shortness of breath, no abdominal pain, nausea or vomiting.   Discharge Exam: BP 120/64 (BP Location: Right Arm)    Pulse 64    Temp 98 F (36.7 C) (Oral)    Resp 18    Ht 5' 2.99" (1.6 m)    Wt 41.6 kg    SpO2 97%    BMI 16.25 kg/m   General: Pt is alert, awake, not in acute distress Cardiovascular: RRR, S1/S2 +, no rubs, no gallops Respiratory: CTA bilaterally, no wheezing, no rhonchi Abdominal: Soft, NT, ND, bowel sounds + Extremities: no edema, no cyanosis    The results of significant diagnostics from this hospitalization (including imaging, microbiology, ancillary and laboratory) are listed below for reference.     Microbiology: No results found for this or any previous visit (from the past 240 hour(s)).   Labs: BNP (last 3 results) Recent Labs    12/09/18 1301 12/10/18 0618 12/11/18 0424  BNP 736.6* 744.1* 937.1*   Basic Metabolic Panel: Recent Labs  Lab 01/08/19 1023 01/09/19 0734  NA 137 139  K 3.9 3.9  CL 100 105  CO2 24 25  GLUCOSE 157* 98  BUN 25* 27*  CREATININE 1.98* 1.82*  CALCIUM 8.5* 8.5*   Liver Function Tests: No results for input(s): AST, ALT, ALKPHOS, BILITOT, PROT, ALBUMIN in the last 168 hours. No results for input(s): LIPASE, AMYLASE in the last 168 hours. No results for input(s): AMMONIA in the last 168 hours. CBC: No results for input(s): WBC, NEUTROABS, HGB, HCT, MCV, PLT in the last 168 hours. Cardiac Enzymes: No results for input(s): CKTOTAL, CKMB, CKMBINDEX, TROPONINI in the last 168 hours. BNP: Invalid input(s): POCBNP CBG: No results for input(s): GLUCAP in the last 168 hours. D-Dimer No results for input(s): DDIMER in the last 72 hours. Hgb A1c No results for input(s): HGBA1C in the last 72 hours. Lipid Profile No results for input(s): CHOL, HDL, LDLCALC, TRIG, CHOLHDL, LDLDIRECT in the last 72  hours. Thyroid function studies No results for input(s): TSH, T4TOTAL, T3FREE, THYROIDAB in the last 72 hours.  Invalid input(s): FREET3 Anemia work up No results for input(s): VITAMINB12, FOLATE, FERRITIN, TIBC, IRON, RETICCTPCT in the last 72 hours. Urinalysis    Component Value Date/Time   COLORURINE YELLOW 12/24/2018 1311   APPEARANCEUR HAZY (A) 12/24/2018 1311   LABSPEC 1.011 12/24/2018 1311   PHURINE 6.0 12/24/2018 1311   GLUCOSEU NEGATIVE 12/24/2018 1311   HGBUR SMALL (A) 12/24/2018 1311   BILIRUBINUR NEGATIVE 12/24/2018 1311   KETONESUR NEGATIVE 12/24/2018 1311   PROTEINUR 100 (A) 12/24/2018 1311   UROBILINOGEN 0.2 08/24/2015 1737   NITRITE NEGATIVE 12/24/2018 1311   LEUKOCYTESUR MODERATE (A) 12/24/2018 1311   Sepsis Labs Invalid input(s): PROCALCITONIN,  WBC,  LACTICIDVEN  FURTHER DISCHARGE INSTRUCTIONS:   Get Medicines reviewed and adjusted: Please take all your medications with you for your next visit with your Primary MD   Laboratory/radiological data: Please request your Primary MD to go over all hospital tests and procedure/radiological results at the follow up, please ask your Primary MD to get all Hospital records sent to his/her office.   In some cases, they will be blood work, cultures and biopsy results pending at the time of your discharge. Please request that your primary care M.D. goes through all the  records of your hospital data and follows up on these results.   Also Note the following: If you experience worsening of your admission symptoms, develop shortness of breath, life threatening emergency, suicidal or homicidal thoughts you must seek medical attention immediately by calling 911 or calling your MD immediately  if symptoms less severe.   You must read complete instructions/literature along with all the possible adverse reactions/side effects for all the Medicines you take and that have been prescribed to you. Take any new Medicines after you have  completely understood and accpet all the possible adverse reactions/side effects.    Do not drive when taking Pain medications or sleeping medications (Benzodaizepines)   Do not take more than prescribed Pain, Sleep and Anxiety Medications. It is not advisable to combine anxiety,sleep and pain medications without talking with your primary care practitioner   Special Instructions: If you have smoked or chewed Tobacco  in the last 2 yrs please stop smoking, stop any regular Alcohol  and or any Recreational drug use.   Wear Seat belts while driving.   Please note: You were cared for by a hospitalist during your hospital stay. Once you are discharged, your primary care physician will handle any further medical issues. Please note that NO REFILLS for any discharge medications will be authorized once you are discharged, as it is imperative that you return to your primary care physician (or establish a relationship with a primary care physician if you do not have one) for your post hospital discharge needs so that they can reassess your need for medications and monitor your lab values.  Time coordinating discharge: 45 minutes  SIGNED:  Marzetta Board, PA-S 01/09/2019, 10:00 AM

## 2019-02-15 ENCOUNTER — Other Ambulatory Visit: Payer: Self-pay | Admitting: Cardiology

## 2019-02-15 DIAGNOSIS — I6523 Occlusion and stenosis of bilateral carotid arteries: Secondary | ICD-10-CM

## 2019-03-08 ENCOUNTER — Emergency Department (HOSPITAL_COMMUNITY): Payer: Medicare HMO

## 2019-03-08 ENCOUNTER — Inpatient Hospital Stay (HOSPITAL_COMMUNITY): Payer: Medicare HMO

## 2019-03-08 ENCOUNTER — Encounter (HOSPITAL_COMMUNITY): Payer: Self-pay | Admitting: Emergency Medicine

## 2019-03-08 ENCOUNTER — Inpatient Hospital Stay (HOSPITAL_COMMUNITY)
Admission: EM | Admit: 2019-03-08 | Discharge: 2019-03-12 | DRG: 682 | Disposition: A | Discharge status: Home Health | Payer: Medicare HMO | Attending: Internal Medicine | Admitting: Internal Medicine

## 2019-03-08 ENCOUNTER — Other Ambulatory Visit: Payer: Self-pay

## 2019-03-08 DIAGNOSIS — N183 Chronic kidney disease, stage 3 unspecified: Secondary | ICD-10-CM

## 2019-03-08 DIAGNOSIS — I739 Peripheral vascular disease, unspecified: Secondary | ICD-10-CM | POA: Diagnosis present

## 2019-03-08 DIAGNOSIS — D638 Anemia in other chronic diseases classified elsewhere: Secondary | ICD-10-CM | POA: Diagnosis present

## 2019-03-08 DIAGNOSIS — G894 Chronic pain syndrome: Secondary | ICD-10-CM | POA: Diagnosis not present

## 2019-03-08 DIAGNOSIS — Z7982 Long term (current) use of aspirin: Secondary | ICD-10-CM | POA: Diagnosis not present

## 2019-03-08 DIAGNOSIS — Z7902 Long term (current) use of antithrombotics/antiplatelets: Secondary | ICD-10-CM

## 2019-03-08 DIAGNOSIS — Z96641 Presence of right artificial hip joint: Secondary | ICD-10-CM | POA: Diagnosis present

## 2019-03-08 DIAGNOSIS — I129 Hypertensive chronic kidney disease with stage 1 through stage 4 chronic kidney disease, or unspecified chronic kidney disease: Secondary | ICD-10-CM | POA: Diagnosis present

## 2019-03-08 DIAGNOSIS — Z79899 Other long term (current) drug therapy: Secondary | ICD-10-CM | POA: Diagnosis not present

## 2019-03-08 DIAGNOSIS — N179 Acute kidney failure, unspecified: Secondary | ICD-10-CM | POA: Diagnosis not present

## 2019-03-08 DIAGNOSIS — M5416 Radiculopathy, lumbar region: Secondary | ICD-10-CM | POA: Diagnosis present

## 2019-03-08 DIAGNOSIS — Z1159 Encounter for screening for other viral diseases: Secondary | ICD-10-CM

## 2019-03-08 DIAGNOSIS — Y92009 Unspecified place in unspecified non-institutional (private) residence as the place of occurrence of the external cause: Secondary | ICD-10-CM

## 2019-03-08 DIAGNOSIS — F17210 Nicotine dependence, cigarettes, uncomplicated: Secondary | ICD-10-CM | POA: Diagnosis present

## 2019-03-08 DIAGNOSIS — R627 Adult failure to thrive: Secondary | ICD-10-CM | POA: Diagnosis present

## 2019-03-08 DIAGNOSIS — Z8249 Family history of ischemic heart disease and other diseases of the circulatory system: Secondary | ICD-10-CM | POA: Diagnosis not present

## 2019-03-08 DIAGNOSIS — F339 Major depressive disorder, recurrent, unspecified: Secondary | ICD-10-CM | POA: Diagnosis present

## 2019-03-08 DIAGNOSIS — Z79891 Long term (current) use of opiate analgesic: Secondary | ICD-10-CM | POA: Diagnosis not present

## 2019-03-08 DIAGNOSIS — G9341 Metabolic encephalopathy: Secondary | ICD-10-CM | POA: Diagnosis present

## 2019-03-08 DIAGNOSIS — E869 Volume depletion, unspecified: Secondary | ICD-10-CM | POA: Diagnosis present

## 2019-03-08 DIAGNOSIS — E785 Hyperlipidemia, unspecified: Secondary | ICD-10-CM | POA: Diagnosis present

## 2019-03-08 DIAGNOSIS — E860 Dehydration: Secondary | ICD-10-CM | POA: Diagnosis not present

## 2019-03-08 DIAGNOSIS — M4316 Spondylolisthesis, lumbar region: Secondary | ICD-10-CM | POA: Diagnosis present

## 2019-03-08 DIAGNOSIS — E876 Hypokalemia: Secondary | ICD-10-CM | POA: Diagnosis present

## 2019-03-08 DIAGNOSIS — D649 Anemia, unspecified: Secondary | ICD-10-CM | POA: Diagnosis not present

## 2019-03-08 DIAGNOSIS — R531 Weakness: Secondary | ICD-10-CM

## 2019-03-08 DIAGNOSIS — Z8349 Family history of other endocrine, nutritional and metabolic diseases: Secondary | ICD-10-CM

## 2019-03-08 DIAGNOSIS — W19XXXA Unspecified fall, initial encounter: Secondary | ICD-10-CM | POA: Diagnosis present

## 2019-03-08 LAB — COMPREHENSIVE METABOLIC PANEL
ALT: 9 U/L (ref 0–44)
AST: 20 U/L (ref 15–41)
Albumin: 3.6 g/dL (ref 3.5–5.0)
Alkaline Phosphatase: 69 U/L (ref 38–126)
Anion gap: 11 (ref 5–15)
BUN: 48 mg/dL — ABNORMAL HIGH (ref 8–23)
CO2: 12 mmol/L — ABNORMAL LOW (ref 22–32)
Calcium: 8.9 mg/dL (ref 8.9–10.3)
Chloride: 112 mmol/L — ABNORMAL HIGH (ref 98–111)
Creatinine, Ser: 3.66 mg/dL — ABNORMAL HIGH (ref 0.44–1.00)
GFR calc Af Amer: 13 mL/min — ABNORMAL LOW (ref >60)
GFR calc non Af Amer: 11 mL/min — ABNORMAL LOW (ref >60)
Glucose, Bld: 89 mg/dL (ref 70–99)
Potassium: 2.5 mmol/L — CL (ref 3.5–5.1)
Sodium: 135 mmol/L (ref 135–145)
Total Bilirubin: 0.6 mg/dL (ref 0.3–1.2)
Total Protein: 6.7 g/dL (ref 6.5–8.1)

## 2019-03-08 LAB — POC OCCULT BLOOD, ED: Fecal Occult Bld: NEGATIVE

## 2019-03-08 LAB — CBC WITH DIFFERENTIAL/PLATELET
Abs Immature Granulocytes: 0.06 10*3/uL (ref 0.00–0.07)
Basophils Absolute: 0 10*3/uL (ref 0.0–0.1)
Basophils Relative: 1 %
Eosinophils Absolute: 0 10*3/uL (ref 0.0–0.5)
Eosinophils Relative: 1 %
HCT: 25.3 % — ABNORMAL LOW (ref 36.0–46.0)
Hemoglobin: 8 g/dL — ABNORMAL LOW (ref 12.0–15.0)
Immature Granulocytes: 1 %
Lymphocytes Relative: 28 %
Lymphs Abs: 1.7 10*3/uL (ref 0.7–4.0)
MCH: 28.1 pg (ref 26.0–34.0)
MCHC: 31.6 g/dL (ref 30.0–36.0)
MCV: 88.8 fL (ref 80.0–100.0)
Monocytes Absolute: 0.7 10*3/uL (ref 0.1–1.0)
Monocytes Relative: 12 %
Neutro Abs: 3.6 10*3/uL (ref 1.7–7.7)
Neutrophils Relative %: 57 %
Platelets: 193 10*3/uL (ref 150–400)
RBC: 2.85 MIL/uL — ABNORMAL LOW (ref 3.87–5.11)
RDW: 15.9 % — ABNORMAL HIGH (ref 11.5–15.5)
WBC: 6.2 10*3/uL (ref 4.0–10.5)
nRBC: 0 % (ref 0.0–0.2)

## 2019-03-08 LAB — URINALYSIS, ROUTINE W REFLEX MICROSCOPIC
Bilirubin Urine: NEGATIVE
Glucose, UA: NEGATIVE mg/dL
Hgb urine dipstick: NEGATIVE
Ketones, ur: NEGATIVE mg/dL
Leukocytes,Ua: NEGATIVE
Nitrite: NEGATIVE
Protein, ur: 100 mg/dL — AB
Specific Gravity, Urine: 1.014 (ref 1.005–1.030)
pH: 5 (ref 5.0–8.0)

## 2019-03-08 LAB — SARS CORONAVIRUS 2 BY RT PCR (HOSPITAL ORDER, PERFORMED IN ~~LOC~~ HOSPITAL LAB): SARS Coronavirus 2: NEGATIVE

## 2019-03-08 LAB — MAGNESIUM: Magnesium: 2 mg/dL (ref 1.7–2.4)

## 2019-03-08 LAB — TYPE AND SCREEN
ABO/RH(D): O POS
Antibody Screen: NEGATIVE

## 2019-03-08 LAB — PROTIME-INR
INR: 2 — ABNORMAL HIGH (ref 0.8–1.2)
Prothrombin Time: 22.2 seconds — ABNORMAL HIGH (ref 11.4–15.2)

## 2019-03-08 LAB — I-STAT TROPONIN, ED: Troponin i, poc: 0.04 ng/mL (ref 0.00–0.08)

## 2019-03-08 LAB — LIPASE, BLOOD: Lipase: 25 U/L (ref 11–51)

## 2019-03-08 LAB — LACTIC ACID, PLASMA: Lactic Acid, Venous: 0.9 mmol/L (ref 0.5–1.9)

## 2019-03-08 MED ORDER — CITALOPRAM HYDROBROMIDE 10 MG PO TABS
40.0000 mg | ORAL_TABLET | Freq: Every day | ORAL | Status: DC
  Administered 2019-03-09: 40 mg via ORAL
  Filled 2019-03-08: qty 4

## 2019-03-08 MED ORDER — ROSUVASTATIN CALCIUM 20 MG PO TABS
40.0000 mg | ORAL_TABLET | Freq: Every day | ORAL | Status: DC
  Administered 2019-03-08 – 2019-03-11 (×4): 40 mg via ORAL
  Filled 2019-03-08 (×4): qty 2

## 2019-03-08 MED ORDER — CLONAZEPAM 0.5 MG PO TABS
0.5000 mg | ORAL_TABLET | Freq: Two times a day (BID) | ORAL | Status: DC | PRN
  Administered 2019-03-11: 0.5 mg via ORAL
  Filled 2019-03-08: qty 1

## 2019-03-08 MED ORDER — ISOSORBIDE MONONITRATE ER 30 MG PO TB24
60.0000 mg | ORAL_TABLET | Freq: Every day | ORAL | Status: DC
  Administered 2019-03-09 – 2019-03-12 (×4): 60 mg via ORAL
  Filled 2019-03-08 (×4): qty 2

## 2019-03-08 MED ORDER — POTASSIUM CHLORIDE CRYS ER 20 MEQ PO TBCR
40.0000 meq | EXTENDED_RELEASE_TABLET | Freq: Once | ORAL | Status: AC
  Administered 2019-03-08: 40 meq via ORAL
  Filled 2019-03-08: qty 2

## 2019-03-08 MED ORDER — ACETAMINOPHEN 500 MG PO TABS
1000.0000 mg | ORAL_TABLET | Freq: Once | ORAL | Status: AC
  Administered 2019-03-08: 16:00:00 1000 mg via ORAL
  Filled 2019-03-08: qty 2

## 2019-03-08 MED ORDER — FERROUS SULFATE 325 (65 FE) MG PO TABS
325.0000 mg | ORAL_TABLET | Freq: Every day | ORAL | Status: DC
  Administered 2019-03-09 – 2019-03-12 (×4): 325 mg via ORAL
  Filled 2019-03-08 (×4): qty 1

## 2019-03-08 MED ORDER — ACETAMINOPHEN 325 MG PO TABS
650.0000 mg | ORAL_TABLET | Freq: Four times a day (QID) | ORAL | Status: DC | PRN
  Administered 2019-03-11: 650 mg via ORAL
  Filled 2019-03-08: qty 2

## 2019-03-08 MED ORDER — CLOPIDOGREL BISULFATE 75 MG PO TABS
75.0000 mg | ORAL_TABLET | Freq: Every day | ORAL | Status: DC
  Administered 2019-03-09 – 2019-03-12 (×4): 75 mg via ORAL
  Filled 2019-03-08 (×4): qty 1

## 2019-03-08 MED ORDER — METOPROLOL TARTRATE 25 MG PO TABS
12.5000 mg | ORAL_TABLET | Freq: Two times a day (BID) | ORAL | Status: DC
  Administered 2019-03-08 – 2019-03-12 (×8): 12.5 mg via ORAL
  Filled 2019-03-08 (×8): qty 1

## 2019-03-08 MED ORDER — ISOSORB DINITRATE-HYDRALAZINE 20-37.5 MG PO TABS
2.0000 | ORAL_TABLET | Freq: Three times a day (TID) | ORAL | Status: DC
  Administered 2019-03-08 – 2019-03-12 (×11): 2 via ORAL
  Filled 2019-03-08 (×11): qty 2

## 2019-03-08 MED ORDER — POTASSIUM CHLORIDE CRYS ER 20 MEQ PO TBCR
40.0000 meq | EXTENDED_RELEASE_TABLET | Freq: Once | ORAL | Status: AC
  Administered 2019-03-08: 16:00:00 40 meq via ORAL
  Filled 2019-03-08: qty 2

## 2019-03-08 MED ORDER — HEPARIN SODIUM (PORCINE) 5000 UNIT/ML IJ SOLN
5000.0000 [IU] | Freq: Three times a day (TID) | INTRAMUSCULAR | Status: DC
  Administered 2019-03-08 – 2019-03-12 (×11): 5000 [IU] via SUBCUTANEOUS
  Filled 2019-03-08 (×11): qty 1

## 2019-03-08 MED ORDER — ASPIRIN EC 81 MG PO TBEC
81.0000 mg | DELAYED_RELEASE_TABLET | Freq: Every day | ORAL | Status: DC
  Administered 2019-03-09 – 2019-03-12 (×4): 81 mg via ORAL
  Filled 2019-03-08 (×4): qty 1

## 2019-03-08 MED ORDER — GABAPENTIN 100 MG PO CAPS
100.0000 mg | ORAL_CAPSULE | Freq: Two times a day (BID) | ORAL | Status: DC
  Administered 2019-03-08 – 2019-03-12 (×8): 100 mg via ORAL
  Filled 2019-03-08 (×8): qty 1

## 2019-03-08 MED ORDER — TEMAZEPAM 15 MG PO CAPS: 15.0000 mg | ORAL_CAPSULE | Freq: Every evening | ORAL | Status: DC | PRN

## 2019-03-08 MED ORDER — VALPROIC ACID 250 MG PO CAPS
250.0000 mg | ORAL_CAPSULE | Freq: Two times a day (BID) | ORAL | Status: DC
  Administered 2019-03-08 – 2019-03-12 (×8): 250 mg via ORAL
  Filled 2019-03-08 (×9): qty 1

## 2019-03-08 MED ORDER — TRAMADOL HCL 50 MG PO TABS
50.0000 mg | ORAL_TABLET | Freq: Four times a day (QID) | ORAL | Status: DC | PRN
  Administered 2019-03-08 – 2019-03-11 (×4): 50 mg via ORAL
  Filled 2019-03-08 (×4): qty 1

## 2019-03-08 MED ORDER — ACETAMINOPHEN 650 MG RE SUPP: 650.0000 mg | Freq: Four times a day (QID) | RECTAL | Status: DC | PRN

## 2019-03-08 MED ORDER — SODIUM CHLORIDE 0.9 % IV BOLUS
1000.0000 mL | Freq: Once | INTRAVENOUS | Status: AC
  Administered 2019-03-08: 18:00:00 1000 mL via INTRAVENOUS

## 2019-03-08 MED ORDER — DONEPEZIL HCL 5 MG PO TABS: 5.0000 mg | ORAL_TABLET | Freq: Every day | ORAL | Status: DC

## 2019-03-08 MED ORDER — SODIUM CHLORIDE 0.9 % IV SOLN
INTRAVENOUS | Status: DC
  Administered 2019-03-08 – 2019-03-12 (×6): via INTRAVENOUS

## 2019-03-08 NOTE — ED Notes (Signed)
Pt transferred to US. 

## 2019-03-08 NOTE — ED Provider Notes (Signed)
[facility] EMERGENCY DEPARTMENT Provider Note   CSN: [id number] Arrival date & time: 03/08/19  1411    History   Chief Complaint Chief Complaint  Patient presents with  . Weakness    HPI Alyssa [name] is a 78 y.o. female.     78 year old female with prior medical history as detailed below presents for evaluation of generalized weakness and fatigue.  Patient with reported ongoing symptoms over the last 1 to 2 weeks.  She denies fever.  She denies current chest pain.  She reports multiple recent visits to urgent care for evaluation.  She is concerned that she may have COVID.  She denies cough or congestion.  She denies abdominal pain.  The history is provided by the patient and medical records.  Weakness  Severity:  Moderate Onset quality:  Gradual Duration:  2 weeks Timing:  Constant Progression:  Waxing and waning Chronicity:  New Relieved by:  Nothing Worsened by:  Nothing Ineffective treatments:  None tried Associated symptoms: no abdominal pain and no fever     Past Medical History:  Diagnosis Date  . Anxiety   . Arthritis   . Chest pain   . Chronic kidney disease   . Elevated troponin 12/13/2018  . Headache(784.0)   . Hyperlipidemia   . Hypertension   . Joint pain   . Leg pain   . Peripheral neuropathy   . Peripheral vascular disease [facility])     Patient Active Problem List   Diagnosis Date Noted  . Palliative care by specialist   . Goals of care, counseling/discussion   . Altered mental status   . Encephalopathy, toxic 12/24/2018  . Aspiration pneumonia of both lower lobes due to gastric secretions ([place]) 12/24/2018  . Non-ST elevation (NSTEMI) myocardial infarction ([place])   . Chest pain   . Dyspnea on exertion   . Elevated troponin level 12/09/2018  . Chronic migraine 03/27/2018  . Lumbar radiculopathy 03/27/2018  . Gait abnormality 03/27/2018  . CKD (chronic kidney disease) stage 4, GFR 15-29 ml/min (HCC) 12/28/2016  .  Normocytic anemia 12/28/2016  . Chronic pain syndrome 12/28/2016  . Benzodiazepine withdrawal ([place]) 12/28/2016  . Benzodiazepine withdrawal without complication ([place]) 47/42/5956  . Chronic right shoulder pain 12/06/2016  . Fall 09/17/2016  . Fracture of humeral head, closed, right, initial encounter 09/17/2016  . Rib fractures 09/17/2016  . Multiple falls 09/17/2016  . Severe muscle deconditioning 09/17/2016  . Failure to thrive in adult 09/17/2016  . Melena 04/25/2016  . Essential hypertension 04/25/2016  . Anxiety state 04/25/2016  . Tobacco abuse 04/25/2016  . Hyperlipidemia 04/25/2016  . Syncope 04/24/2016  . Syncope and collapse 04/24/2016  . Gait difficulty 01/12/2016  . Foot drop, left 01/12/2016  . Severe recurrent major depression without psychotic features ([place]) 08/28/2015  . Spondylolisthesis at L4-L5 level 07/30/2015  . PVD (peripheral vascular disease) with claudication ([place]) 07/29/2014  . Weakness-Bilateral arm/leg 07/29/2014  . Numbness-left leg 07/29/2014  . Swelling of limb-Legs 07/29/2014  . Atherosclerosis of native arteries of the extremities with intermittent claudication 09/30/2011    Past Surgical History:  Procedure Laterality Date  . ABDOMINAL ANGIOGRAM N/A 10/29/2011   Procedure: ABDOMINAL ANGIOGRAM;  Surgeon: Elam Dutch, MD;  Location[facility] LAB;  Service: Cardiovascular;  Laterality: N/A;  . BACK SURGERY    . FEMORAL-FEMORAL BYPASS GRAFT  08/31/2010  . JOINT REPLACEMENT Right 2012   Hip  . LOWER EXTREMITY ANGIOGRAPHY  06/14/2017   Procedure: Lower Extremity Angiography;  Surgeon: Adrian Prows, MD;  Location: [facility] CV LAB;  Service: Cardiovascular;;  Bilateral limited angio performed  . RENAL ANGIOGRAPHY N/A 06/14/2017   Procedure: Renal Angiography;  Surgeon: Adrian Prows, MD;  Location: [facility] CV LAB;  Service: Cardiovascular;  Laterality: N/A;  . TOTAL HIP ARTHROPLASTY     right     OB History   No obstetric history on file.       Home Medications    Prior to Admission medications   Medication Sig Start Date End Date Taking? Authorizing Provider  aspirin 81 MG EC tablet Take 1 tablet (81 mg total) by mouth daily. 01/09/19   Caren Griffins, MD  citalopram (CELEXA) 40 MG tablet Take 1 tablet (40 mg total) by mouth daily. 01/09/19 01/09/20  Caren Griffins, MD  clonazePAM (KLONOPIN) 1 MG tablet Take 0.5 tablets (0.5 mg total) by mouth 2 (two) times daily as needed for anxiety. 01/09/19   Caren Griffins, MD  clopidogrel (PLAVIX) 75 MG tablet Take 1 tablet (75 mg total) by mouth daily. 01/09/19   Caren Griffins, MD  ergocalciferol (VITAMIN D2) 50000 units capsule Take 50,000 Units by mouth every Monday.     [provider]  ferrous sulfate 325 (65 FE) MG tablet Take 1 tablet (325 mg total) by mouth every Monday, Wednesday, and Friday. With breakfast 12/13/18   Nita Sells, MD  gabapentin (NEURONTIN) 300 MG capsule Take 1 capsule (300 mg total) by mouth 3 (three) times daily. 12/13/18   Nita Sells, MD  hydrALAZINE (APRESOLINE) 25 MG tablet Take 1 tablet (25 mg total) by mouth 3 (three) times daily. 01/09/19   Caren Griffins, MD  isosorbide mononitrate (IMDUR) 60 MG 24 hr tablet Take 1 tablet (60 mg total) by mouth daily. 01/09/19   Caren Griffins, MD  metoprolol tartrate (LOPRESSOR) 25 MG tablet Take 1 tablet (25 mg total) by mouth 2 (two) times daily. 01/09/19   Caren Griffins, MD  omeprazole (PRILOSEC) 40 MG capsule Take 1 capsule (40 mg total) by mouth daily. 02/08/18   Ladene Artist, MD  rosuvastatin (CRESTOR) 40 MG tablet Take 1 tablet (40 mg total) by mouth daily. 01/09/19   Caren Griffins, MD  temazepam (RESTORIL) 15 MG capsule Take 1 capsule (15 mg total) by mouth at bedtime as needed for sleep. 01/09/19   Caren Griffins, MD  traMADol (ULTRAM) 50 MG tablet Take 1 tablet (50 mg total) by mouth every 6 (six) hours as needed for moderate pain. 01/09/19   Caren Griffins, MD   valproic acid (DEPAKENE) 250 MG capsule Take 1 capsule (250 mg total) by mouth 2 (two) times daily. 01/09/19   Caren Griffins, MD    Family History Family History  Problem Relation Age of Onset  . Heart attack Mother   . Heart disease Mother   . Hyperlipidemia Mother   . Hypertension Mother   . Heart attack Father   . Heart disease Father        Before age 72  . Hyperlipidemia Father   . Hypertension Father   . Cancer Sister        brain tumor  . Aneurysm Brother        brain  . Colon cancer Neg Hx   . Liver cancer Neg Hx     Social History Social History   Tobacco Use  . Smoking status: Former Smoker    Packs/day: 0.25    Types: Cigarettes  Last attempt to quit: 11/22/2018    Years since quitting: 0.2  . Smokeless tobacco: Never Used  . Tobacco comment: 5 cigs a day   Substance Use Topics  . Alcohol use: No  . Drug use: No     Allergies   Patient has no known allergies.   Review of Systems Review of Systems  Constitutional: Negative for fever.  Gastrointestinal: Negative for abdominal pain.  Neurological: Positive for weakness.  All other systems reviewed and are negative.    Physical Exam Updated Vital Signs BP 107/74 (BP Location: Right Arm)   Pulse 65   Temp 100 F (37.8 C) (Rectal)   Resp 17   Ht 5\' 2"  (1.575 m)   Wt 45.4 kg   SpO2 100%   BMI 18.29 kg/m   Physical Exam Vitals signs and nursing note reviewed.  Constitutional:      General: She is not in acute distress.    Appearance: She is well-developed.     Comments: Frail and chronically ill in appearance  HENT:     Head: Normocephalic and atraumatic.  Eyes:     Conjunctiva/sclera: Conjunctivae normal.     Pupils: Pupils are equal, round, and reactive to light.  Neck:     Musculoskeletal: Normal range of motion and neck supple.  Cardiovascular:     Rate and Rhythm: Normal rate and regular rhythm.     Heart sounds: Normal heart sounds.  Pulmonary:     Effort: Pulmonary  effort is normal. No respiratory distress.     Breath sounds: Normal breath sounds.  Abdominal:     General: There is no distension.     Palpations: Abdomen is soft.     Tenderness: There is no abdominal tenderness.  Musculoskeletal: Normal range of motion.        General: No deformity.  Skin:    General: Skin is warm and dry.  Neurological:     Mental Status: She is alert and oriented to person, place, and time.      ED Treatments / Results  Labs (all labs ordered are listed, but only abnormal results are displayed) Labs Reviewed  SARS CORONAVIRUS 2 ([facility])  CULTURE, BLOOD (ROUTINE X 2)  CULTURE, BLOOD (ROUTINE X 2)  URINALYSIS, ROUTINE W REFLEX MICROSCOPIC  COMPREHENSIVE METABOLIC PANEL  LIPASE, BLOOD  CBC WITH DIFFERENTIAL/PLATELET  PROTIME-INR  LACTIC ACID, PLASMA  LACTIC ACID, PLASMA  I-STAT TROPONIN, ED  TYPE AND SCREEN    EKG EKG Interpretation  Date/Time:  Thursday Mar 08 2019 14:12:34 EDT Ventricular Rate:  66 PR Interval:    QRS Duration: 93 QT Interval:  531 QTC Calculation: 557 R Axis:   78 Text Interpretation:  Sinus rhythm Repol abnrm suggests ischemia, diffuse leads Prolonged QT interval Confirmed by Dene Gentry [phone]) on 03/08/2019 2:36:46 PM   Radiology No results found.  Procedures Procedures (including critical care time)  Medications Ordered in ED Medications - No data to display   Initial Impression / Assessment and Plan / ED Course  I have reviewed the triage vital signs and the nursing notes.  Pertinent labs & imaging results that were available during my care of the patient were reviewed by me and considered in my medical decision making (see chart for details).        MDM  Screen complete  Alyssa [name] was evaluated in Emergency Department on 03/08/2019 for the symptoms described in the history of present illness. She  was evaluated in the context of the global  COVID-19 pandemic, which necessitated consideration that the patient might be at risk for infection with the SARS-CoV-2 virus that causes COVID-19. Institutional protocols and algorithms that pertain to the evaluation of patients at risk for COVID-19 are in a state of rapid change based on information released by regulatory bodies including the CDC and federal and state organizations. These policies and algorithms were followed during the patient's care in the ED.  Patient with significant comorbidities is presenting for evaluation of prolonged weakness.  This may be multifactorial.  Screening labs ordered in the ED and are now pending.  Will screen for COVID although patient does not appear to have symptoms strongly suggestive of COVID infection.  Screening labs ordered.   Dr Gilford Raid aware of pending studies and disposition.     Final Clinical Impressions(s) / ED Diagnoses   Final diagnoses:  Weakness    ED Discharge Orders    None       Valarie Merino, MD 03/08/19 [phone]

## 2019-03-08 NOTE — ED Notes (Signed)
ED TO INPATIENT HANDOFF REPORT  ED Nurse Name and Phone #: Kathlee Nations [phone]  S Name/Age/Gender Alyssa [name] 78 y.o. female Room/Bed: 028C/028C  Code Status   Code Status: Full Code  Home/SNF/Other Home Patient oriented to: self, place, time and situation Is this baseline? Yes   Triage Complete: Triage complete  Chief Complaint r/o covid/ sepsis  Triage Note Pt arrives by EMS with complaints of generalized overall weakness X2 weeks. Pt is A/OX3 place self and situation but EMS reports that is her baseline.   TEMP 100.0 rectal    Allergies No Known Allergies  Level of Care/Admitting Diagnosis ED Disposition    ED Disposition Condition Comment   Admit  Hospital Area: [facility] [[id number]]  Level of Care: Med-Surg [16]  Covid Evaluation: N/A  Diagnosis: Acute renal failure (ARF) ([place]) [[id number]]  Admitting Physician: Velta Addison  Attending Physician: Donne Hazel [[id number]]  Estimated length of stay: 3 - 4 days  Certification:: I certify this patient will need inpatient services for at least 2 midnights  PT Class (Do Not Modify): Inpatient [101]  PT Acc Code (Do Not Modify): Private [1]       B Medical/Surgery History Past Medical History:  Diagnosis Date  . Anxiety   . Arthritis   . Chest pain   . Chronic kidney disease   . Elevated troponin 12/13/2018  . Headache(784.0)   . Hyperlipidemia   . Hypertension   . Joint pain   . Leg pain   . Peripheral neuropathy   . Peripheral vascular disease [facility])    Past Surgical History:  Procedure Laterality Date  . ABDOMINAL ANGIOGRAM N/A 10/29/2011   Procedure: ABDOMINAL ANGIOGRAM;  Surgeon: Elam Dutch, MD;  Location: [facility] CATH LAB;  Service: Cardiovascular;  Laterality: N/A;  . BACK SURGERY    . FEMORAL-FEMORAL BYPASS GRAFT  08/31/2010  . JOINT REPLACEMENT Right 2012   Hip  . LOWER EXTREMITY ANGIOGRAPHY  06/14/2017   Procedure: Lower Extremity Angiography;  Surgeon: Adrian Prows,  MD;  Location: [facility] CV LAB;  Service: Cardiovascular;;  Bilateral limited angio performed  . RENAL ANGIOGRAPHY N/A 06/14/2017   Procedure: Renal Angiography;  Surgeon: Adrian Prows, MD;  Location: [facility] CV LAB;  Service: Cardiovascular;  Laterality: N/A;  . TOTAL HIP ARTHROPLASTY     right     A IV Location/Drains/Wounds Patient Lines/Drains/Airways Status   Active Line/Drains/Airways    Name:   Placement date:   Placement time:   Site:   Days:   Peripheral IV 03/08/19 Left Antecubital   03/08/19    1412    Antecubital   less than 1   External Urinary Catheter   01/02/19    1000    -   65          Intake/Output Last 24 hours No intake or output data in the 24 hours ending 03/08/19 1820  Labs/Imaging Results for orders placed or performed during the hospital encounter of 03/08/19 (from the past 48 hour(s))  SARS Coronavirus 2 (CEPHEID - Performed in [facility] hospital lab), Hosp Order     Status: None   Collection Time: 03/08/19  2:58 PM  Result Value Ref Range   SARS Coronavirus 2 NEGATIVE NEGATIVE    Comment: (NOTE) If result is NEGATIVE SARS-CoV-2 target nucleic acids are NOT DETECTED. The SARS-CoV-2 RNA is generally detectable in upper and lower  respiratory specimens during the acute phase of infection. The lowest  concentration  of SARS-CoV-2 viral copies this assay can detect is 250  copies / mL. A negative result does not preclude SARS-CoV-2 infection  and should not be used as the sole basis for treatment or other  patient management decisions.  A negative result may occur with  improper specimen collection / handling, submission of specimen other  than nasopharyngeal swab, presence of viral mutation(s) within the  areas targeted by this assay, and inadequate number of viral copies  (<250 copies / mL). A negative result must be combined with clinical  observations, patient history, and epidemiological information. If result is POSITIVE SARS-CoV-2 target  nucleic acids are DETECTED. The SARS-CoV-2 RNA is generally detectable in upper and lower  respiratory specimens dur ing the acute phase of infection.  Positive  results are indicative of active infection with SARS-CoV-2.  Clinical  correlation with patient history and other diagnostic information is  necessary to determine patient infection status.  Positive results do  not rule out bacterial infection or co-infection with other viruses. If result is PRESUMPTIVE POSTIVE SARS-CoV-2 nucleic acids MAY BE PRESENT.   A presumptive positive result was obtained on the submitted specimen  and confirmed on repeat testing.  While 2019 novel coronavirus  (SARS-CoV-2) nucleic acids may be present in the submitted sample  additional confirmatory testing may be necessary for epidemiological  and / or clinical management purposes  to differentiate between  SARS-CoV-2 and other Sarbecovirus currently known to infect humans.  If clinically indicated additional testing with an alternate test  methodology [id number]) is advised. The SARS-CoV-2 RNA is generally  detectable in upper and lower respiratory sp ecimens during the acute  phase of infection. The expected result is Negative. Fact Sheet for Patients:  StrictlyIdeas.no Fact Sheet for Healthcare Providers: BankingDealers.co.za This test is not yet approved or cleared by the [place] FDA and has been authorized for detection and/or diagnosis of SARS-CoV-2 by FDA under an Emergency Use Authorization (EUA).  This EUA will remain in effect (meaning this test can be used) for the duration of the COVID-19 declaration under Section 564(b)(1) of the Act, 21 U.S.C. section 360bbb-3(b)(1), unless the authorization is terminated or revoked sooner. Performed at [facility], [place] [address] [place], [place] [address]   Comprehensive metabolic panel     Status: Abnormal   Collection Time: 03/08/19   3:18 PM  Result Value Ref Range   Sodium 135 135 - 145 mmol/L   Potassium 2.5 (LL) 3.5 - 5.1 mmol/L    Comment: CRITICAL RESULT CALLED TO, READ BACK BY AND VERIFIED WITH: Adora Fridge 1605 03/08/2019 WBOND    Chloride 112 (H) 98 - 111 mmol/L   CO2 12 (L) 22 - 32 mmol/L   Glucose, Bld 89 70 - 99 mg/dL   BUN 48 (H) 8 - 23 mg/dL   Creatinine, Ser 3.66 (H) 0.44 - 1.00 mg/dL   Calcium 8.9 8.9 - 10.3 mg/dL   Total Protein 6.7 6.5 - 8.1 g/dL   Albumin 3.6 3.5 - 5.0 g/dL   AST 20 15 - 41 U/L   ALT 9 0 - 44 U/L   Alkaline Phosphatase 69 38 - 126 U/L   Total Bilirubin 0.6 0.3 - 1.2 mg/dL   GFR calc non Af Amer 11 (L) >60 mL/min   GFR calc Af Amer 13 (L) >60 mL/min   Anion gap 11 5 - 15    Comment: Performed at [facility], [place] [address], [place], [place] [address]  Lipase,  blood     Status: None   Collection Time: 03/08/19  3:18 PM  Result Value Ref Range   Lipase 25 11 - 51 U/L    Comment: Performed at [facility], [place] [address] [place], [place] [address]  CBC with Differential     Status: Abnormal   Collection Time: 03/08/19  3:18 PM  Result Value Ref Range   WBC 6.2 4.0 - 10.5 K/uL   RBC 2.85 (L) 3.87 - 5.11 MIL/uL   Hemoglobin 8.0 (L) 12.0 - 15.0 g/dL   HCT 25.3 (L) 36.0 - 46.0 %   MCV 88.8 80.0 - 100.0 fL   MCH 28.1 26.0 - 34.0 pg   MCHC 31.6 30.0 - 36.0 g/dL   RDW 15.9 (H) 11.5 - 15.5 %   Platelets 193 150 - 400 K/uL    Comment: REPEATED TO VERIFY   nRBC 0.0 0.0 - 0.2 %   Neutrophils Relative % 57 %   Neutro Abs 3.6 1.7 - 7.7 K/uL   Lymphocytes Relative 28 %   Lymphs Abs 1.7 0.7 - 4.0 K/uL   Monocytes Relative 12 %   Monocytes Absolute 0.7 0.1 - 1.0 K/uL   Eosinophils Relative 1 %   Eosinophils Absolute 0.0 0.0 - 0.5 K/uL   Basophils Relative 1 %   Basophils Absolute 0.0 0.0 - 0.1 K/uL   Immature Granulocytes 1 %   Abs Immature Granulocytes 0.06 0.00 - 0.07 K/uL    Comment: Performed at [facility], [place] [address], [place], [place] [address]   Protime-INR     Status: Abnormal   Collection Time: 03/08/19  3:18 PM  Result Value Ref Range   Prothrombin Time 22.2 (H) 11.4 - 15.2 seconds   INR 2.0 (H) 0.8 - 1.2    Comment: (NOTE) INR goal varies based on device and disease states. Performed at [facility], [place] [address], [place], [place] [address]   Lactic acid, plasma     Status: None   Collection Time: 03/08/19  3:19 PM  Result Value Ref Range   Lactic Acid, Venous 0.9 0.5 - 1.9 mmol/L    Comment: Performed at [facility] [address] [place], [place] [address]  I-Stat Troponin, ED (not at [facility])     Status: None   Collection Time: 03/08/19  3:23 PM  Result Value Ref Range   Troponin i, poc 0.04 0.00 - 0.08 ng/mL   Comment 3            Comment: Due to the release kinetics of cTnI, a negative result within the first hours of the onset of symptoms does not rule out myocardial infarction with certainty. If myocardial infarction is still suspected, repeat the test at appropriate intervals.   Type and screen [place]     Status: None   Collection Time: 03/08/19  3:46 PM  Result Value Ref Range   ABO/RH(D) O POS    Antibody Screen NEG    Sample Expiration      03/11/2019,2359 Performed at [facility], [place] [address] [place],  [address]   Urinalysis, Routine w reflex microscopic     Status: Abnormal   Collection Time: 03/08/19  4:00 PM  Result Value Ref Range   Color, Urine YELLOW YELLOW   APPearance HAZY (A) CLEAR   Specific Gravity, Urine 1.014 1.005 - 1.030   pH 5.0 5.0 - 8.0   Glucose, UA NEGATIVE NEGATIVE  mg/dL   Hgb urine dipstick NEGATIVE NEGATIVE   Bilirubin Urine NEGATIVE NEGATIVE   Ketones, ur NEGATIVE NEGATIVE mg/dL   Protein, ur 100 (A) NEGATIVE mg/dL   Nitrite NEGATIVE NEGATIVE   Leukocytes,Ua NEGATIVE NEGATIVE   RBC / HPF 0-5 0 - 5 RBC/hpf   WBC, UA 0-5 0 - 5 WBC/hpf   Bacteria, UA RARE (A) NONE SEEN   Mucus PRESENT     Comment: Performed at  [facility] [address] [place], [place] [address]  Magnesium     Status: None   Collection Time: 03/08/19  4:12 PM  Result Value Ref Range   Magnesium 2.0 1.7 - 2.4 mg/dL    Comment: Performed at [facility], [place] [address], [place],  [address]  POC occult blood, ED Provider will collect     Status: None   Collection Time: 03/08/19  4:16 PM  Result Value Ref Range   Fecal Occult Bld NEGATIVE NEGATIVE   Dg Chest Port 1 View  Result Date: 03/08/2019 CLINICAL DATA:  Shortness of breath EXAM: PORTABLE CHEST 1 VIEW COMPARISON:  12/27/2018 FINDINGS: Heart is borderline in size. No confluent airspace opacities, effusions or edema. No acute bony abnormality. IMPRESSION: No active disease. Electronically Signed   By: Rolm Baptise M.D.   On: 03/08/2019 17:04    Pending Labs Unresulted Labs (From admission, onward)    Start     Ordered   03/09/19 0500  Comprehensive metabolic panel  Tomorrow morning,   R     03/08/19 1819   03/09/19 0500  CBC  Tomorrow morning,   R     03/08/19 1819   03/08/19 1820  CBC  (heparin)  Once,   R    Comments:  Baseline for heparin therapy IF NOT ALREADY DRAWN.  Notify MD if PLT < 100 K.    03/08/19 1819   03/08/19 1820  Creatinine, serum  (heparin)  Once,   R    Comments:  Baseline for heparin therapy IF NOT ALREADY DRAWN.    03/08/19 1819   03/08/19 1820  Iron and TIBC  Once,   R     03/08/19 1819   03/08/19 1436  Culture, blood (routine x 2)  BLOOD CULTURE X 2,   STAT     03/08/19 1435   03/08/19 1436  Lactic acid, plasma  Now then every 2 hours,   STAT     03/08/19 1435          Vitals/Pain Today's Vitals   03/08/19 1645 03/08/19 1715 03/08/19 1800 03/08/19 1800  BP: 125/82 114/71 112/72   Pulse: 64 63 65   Resp: 15 16 16    Temp:      TempSrc:      SpO2: 100% 100% 100%   Weight:      Height:      PainSc:   0-No pain 0-No pain    Isolation Precautions No active isolations  Medications Medications  heparin  injection 5,000 Units (has no administration in time range)  acetaminophen (TYLENOL) tablet 650 mg (has no administration in time range)    Or  acetaminophen (TYLENOL) suppository 650 mg (has no administration in time range)  potassium chloride SA (K-DUR) CR tablet 40 mEq (has no administration in time range)  0.9 %  sodium chloride infusion (has no administration in time range)  acetaminophen (TYLENOL) tablet 1,000 mg (1,000 mg Oral Given 03/08/19 1537)  potassium chloride SA (K-DUR) CR tablet 40  mEq (40 mEq Oral Given 03/08/19 1627)  sodium chloride 0.9 % bolus 1,000 mL (1,000 mLs Intravenous New Bag/Given 03/08/19 1801)    Mobility non-ambulatory High fall risk   Focused Assessments low K. Observation   R Recommendations: See Admitting Provider Note  Report given to:   Additional Notes: Patient is always cold. Has been living with family.

## 2019-03-08 NOTE — H&P (Addendum)
History and Physical    Alyssa [name] ZTI:[id number] DOB: 28-Apr-1941 DOA: 03/08/2019  PCP: Everardo Beals, NP  Patient coming from: Home  Chief Complaint: Weakness  HPI: Alyssa [name] is a 78 y.o. female with medical history significant of stage 3 CKD, normocytic anemia, HTN, active tobacco abuse. Patient is confused and is a very poor historian and unable to give reliable history. 21 of limited history is obtained from patient's son over phone. Per patient's son, patient was recently hospitalized for PNA and clinically improved. Upon d/c home, patient noted to have markedly decreased appetite with resultant increased weakness and increased confusion. Pt was seen at urgent care where she was prescribed a trial of Aricept. Patient also noted to have intermittent LE swelling prior to visit per son. Otherwise, patient currently denies chest pain or sob. She does, however, report some abd discomfort.   Of note, patient's son reports recent fall 3-4 days prior to admit resulting in injury to hip. Pt was seen at urgent care and reportedly had hip xray neg for fracture.  ED Course: In the ED, patient noted to have initial temp of 100F orally and rectally. Potassium was noted to be 2.5 with Cr in excess of 3.66 with BUN of 48. Hgb was noted to be 8.0 with MCV of 88.8. Hemoccult was neg in ED. COVID-19 was noted to be negative.  Review of Systems:  Review of Systems  Constitutional: Positive for malaise/fatigue. Negative for weight loss.  HENT: Negative for congestion, ear discharge and ear pain.   Eyes: Negative for double vision, photophobia and discharge.  Respiratory: Negative for hemoptysis, sputum production and shortness of breath.   Cardiovascular: Negative for palpitations, claudication and leg swelling.  Gastrointestinal: Negative for diarrhea, nausea and vomiting.  Genitourinary: Negative for frequency, hematuria and urgency.  Musculoskeletal: Negative for back  pain, joint pain and neck pain.  Neurological: Negative for tingling, tremors, seizures and loss of consciousness.  Psychiatric/Behavioral: Negative for hallucinations and memory loss. The patient is not nervous/anxious.     Past Medical History:  Diagnosis Date  . Anxiety   . Arthritis   . Chest pain   . Chronic kidney disease   . Elevated troponin 12/13/2018  . Headache(784.0)   . Hyperlipidemia   . Hypertension   . Joint pain   . Leg pain   . Peripheral neuropathy   . Peripheral vascular disease [facility])     Past Surgical History:  Procedure Laterality Date  . ABDOMINAL ANGIOGRAM N/A 10/29/2011   Procedure: ABDOMINAL ANGIOGRAM;  Surgeon: Elam Dutch, MD;  Location: [facility] CATH LAB;  Service: Cardiovascular;  Laterality: N/A;  . BACK SURGERY    . FEMORAL-FEMORAL BYPASS GRAFT  08/31/2010  . JOINT REPLACEMENT Right 2012   Hip  . LOWER EXTREMITY ANGIOGRAPHY  06/14/2017   Procedure: Lower Extremity Angiography;  Surgeon: Adrian Prows, MD;  Location: [facility] CV LAB;  Service: Cardiovascular;;  Bilateral limited angio performed  . RENAL ANGIOGRAPHY N/A 06/14/2017   Procedure: Renal Angiography;  Surgeon: Adrian Prows, MD;  Location: [place] CV LAB;  Service: Cardiovascular;  Laterality: N/A;  . TOTAL HIP ARTHROPLASTY     right     reports that she quit smoking about 3 months ago. Her smoking use included cigarettes. She smoked 0.25 packs per day. She has never used smokeless tobacco. She reports that she does not drink alcohol or use drugs.  No Known Allergies  Family History  Problem Relation Age of Onset  .  Heart attack Mother   . Heart disease Mother   . Hyperlipidemia Mother   . Hypertension Mother   . Heart attack Father   . Heart disease Father        Before age 45  . Hyperlipidemia Father   . Hypertension Father   . Cancer Sister        brain tumor  . Aneurysm Brother        brain  . Colon cancer Neg Hx   . Liver cancer Neg Hx     Prior to Admission  medications   Medication Sig Start Date End Date Taking? Authorizing Provider  aspirin 81 MG EC tablet Take 1 tablet (81 mg total) by mouth daily. 01/09/19   Caren Griffins, MD  citalopram (CELEXA) 40 MG tablet Take 1 tablet (40 mg total) by mouth daily. 01/09/19 01/09/20  Caren Griffins, MD  clonazePAM (KLONOPIN) 1 MG tablet Take 0.5 tablets (0.5 mg total) by mouth 2 (two) times daily as needed for anxiety. 01/09/19   Caren Griffins, MD  clopidogrel (PLAVIX) 75 MG tablet Take 1 tablet (75 mg total) by mouth daily. 01/09/19   Caren Griffins, MD  ergocalciferol (VITAMIN D2) 50000 units capsule Take 50,000 Units by mouth every Monday.     [provider]  ferrous sulfate 325 (65 FE) MG tablet Take 1 tablet (325 mg total) by mouth every Monday, Wednesday, and Friday. With breakfast 12/13/18   Nita Sells, MD  gabapentin (NEURONTIN) 300 MG capsule Take 1 capsule (300 mg total) by mouth 3 (three) times daily. 12/13/18   Nita Sells, MD  hydrALAZINE (APRESOLINE) 25 MG tablet Take 1 tablet (25 mg total) by mouth 3 (three) times daily. 01/09/19   Caren Griffins, MD  isosorbide mononitrate (IMDUR) 60 MG 24 hr tablet Take 1 tablet (60 mg total) by mouth daily. 01/09/19   Caren Griffins, MD  metoprolol tartrate (LOPRESSOR) 25 MG tablet Take 1 tablet (25 mg total) by mouth 2 (two) times daily. 01/09/19   Caren Griffins, MD  omeprazole (PRILOSEC) 40 MG capsule Take 1 capsule (40 mg total) by mouth daily. 02/08/18   Ladene Artist, MD  rosuvastatin (CRESTOR) 40 MG tablet Take 1 tablet (40 mg total) by mouth daily. 01/09/19   Caren Griffins, MD  temazepam (RESTORIL) 15 MG capsule Take 1 capsule (15 mg total) by mouth at bedtime as needed for sleep. 01/09/19   Caren Griffins, MD  traMADol (ULTRAM) 50 MG tablet Take 1 tablet (50 mg total) by mouth every 6 (six) hours as needed for moderate pain. 01/09/19   Caren Griffins, MD  valproic acid (DEPAKENE) 250 MG capsule Take 1  capsule (250 mg total) by mouth 2 (two) times daily. 01/09/19   Caren Griffins, MD    Physical Exam: Vitals:   03/08/19 1537 03/08/19 1600 03/08/19 1645 03/08/19 1715  BP: 110/61 117/64 125/82 114/71  Pulse: 66 65 64 63  Resp: 15 16 15 16   Temp:      TempSrc:      SpO2: 99% 99% 100% 100%  Weight:      Height:        Constitutional: NAD, calm, comfortable Vitals:   03/08/19 1537 03/08/19 1600 03/08/19 1645 03/08/19 1715  BP: 110/61 117/64 125/82 114/71  Pulse: 66 65 64 63  Resp: 15 16 15 16   Temp:      TempSrc:      SpO2: 99% 99% 100% 100%  Weight:      Height:       Eyes: PERRL, lids and conjunctivae normal ENMT: Mucous membranes are dry. Posterior pharynx clear of any exudate or lesions. Neck: normal, supple, no masses, no thyromegaly Respiratory: clear to auscultation bilaterally, no wheezing, no crackles. Normal respiratory effort. No accessory muscle use.  Cardiovascular: Regular rate and rhythm, No extremity edema. 2+ pedal pulses. No carotid bruits.  Abdomen: no tenderness, no masses palpated. No hepatosplenomegaly. Bowel sounds positive.  Musculoskeletal: no clubbing / cyanosis. No joint deformity upper and lower extremities. Good ROM, no contractures. Normal muscle tone.  Skin: no rashes, lesions. No induration Neurologic: CN 2-12 grossly intact. Sensation intact,. Strength 5/5 in all 4.  Psychiatric: Appears confused however is oriented x3. Normal mood.    Labs on Admission: I have personally reviewed following labs and imaging studies  CBC: Recent Labs  Lab 03/08/19 1518  WBC 6.2  NEUTROABS 3.6  HGB 8.0*  HCT 25.3*  MCV 88.8  PLT 829   Basic Metabolic Panel: Recent Labs  Lab 03/08/19 1518 03/08/19 1612  NA 135  --   K 2.5*  --   CL 112*  --   CO2 12*  --   GLUCOSE 89  --   BUN 48*  --   CREATININE 3.66*  --   CALCIUM 8.9  --   MG  --  2.0   GFR: Estimated Creatinine Clearance: 9.5 mL/min (A) (by C-G formula based on SCr of 3.66 mg/dL  (H)). Liver Function Tests: Recent Labs  Lab 03/08/19 1518  AST 20  ALT 9  ALKPHOS 69  BILITOT 0.6  PROT 6.7  ALBUMIN 3.6   Recent Labs  Lab 03/08/19 1518  LIPASE 25   No results for input(s): AMMONIA in the last 168 hours. Coagulation Profile: Recent Labs  Lab 03/08/19 1518  INR 2.0*   Cardiac Enzymes: No results for input(s): CKTOTAL, CKMB, CKMBINDEX, TROPONINI in the last 168 hours. BNP (last 3 results) No results for input(s): PROBNP in the last 8760 hours. HbA1C: No results for input(s): HGBA1C in the last 72 hours. CBG: No results for input(s): GLUCAP in the last 168 hours. Lipid Profile: No results for input(s): CHOL, HDL, LDLCALC, TRIG, CHOLHDL, LDLDIRECT in the last 72 hours. Thyroid Function Tests: No results for input(s): TSH, T4TOTAL, FREET4, T3FREE, THYROIDAB in the last 72 hours. Anemia Panel: No results for input(s): VITAMINB12, FOLATE, FERRITIN, TIBC, IRON, RETICCTPCT in the last 72 hours. Urine analysis:    Component Value Date/Time   COLORURINE YELLOW 03/08/2019 1600   APPEARANCEUR HAZY (A) 03/08/2019 1600   LABSPEC 1.014 03/08/2019 1600   PHURINE 5.0 03/08/2019 1600   GLUCOSEU NEGATIVE 03/08/2019 1600   HGBUR NEGATIVE 03/08/2019 1600   BILIRUBINUR NEGATIVE 03/08/2019 1600   KETONESUR NEGATIVE 03/08/2019 1600   PROTEINUR 100 (A) 03/08/2019 1600   UROBILINOGEN 0.2 08/24/2015 1737   NITRITE NEGATIVE 03/08/2019 1600   LEUKOCYTESUR NEGATIVE 03/08/2019 1600   Sepsis Labs: !!!!!!!!!!!!!!!!!!!!!!!!!!!!!!!!!!!!!!!!!!!! @LABRCNTIP (procalcitonin:4,lacticidven:4) ) Recent Results (from the past 240 hour(s))  SARS Coronavirus 2 (CEPHEID - Performed in [place] hospital lab), Hosp Order     Status: None   Collection Time: 03/08/19  2:58 PM  Result Value Ref Range Status   SARS Coronavirus 2 NEGATIVE NEGATIVE Final    Comment: (NOTE) If result is NEGATIVE SARS-CoV-2 target nucleic acids are NOT DETECTED. The SARS-CoV-2 RNA is generally  detectable in upper and lower  respiratory specimens during the acute phase of infection. The lowest  concentration of SARS-CoV-2 viral copies this assay can detect is 250  copies / mL. A negative result does not preclude SARS-CoV-2 infection  and should not be used as the sole basis for treatment or other  patient management decisions.  A negative result may occur with  improper specimen collection / handling, submission of specimen other  than nasopharyngeal swab, presence of viral mutation(s) within the  areas targeted by this assay, and inadequate number of viral copies  (<250 copies / mL). A negative result must be combined with clinical  observations, patient history, and epidemiological information. If result is POSITIVE SARS-CoV-2 target nucleic acids are DETECTED. The SARS-CoV-2 RNA is generally detectable in upper and lower  respiratory specimens dur ing the acute phase of infection.  Positive  results are indicative of active infection with SARS-CoV-2.  Clinical  correlation with patient history and other diagnostic information is  necessary to determine patient infection status.  Positive results do  not rule out bacterial infection or co-infection with other viruses. If result is PRESUMPTIVE POSTIVE SARS-CoV-2 nucleic acids MAY BE PRESENT.   A presumptive positive result was obtained on the submitted specimen  and confirmed on repeat testing.  While 2019 novel coronavirus  (SARS-CoV-2) nucleic acids may be present in the submitted sample  additional confirmatory testing may be necessary for epidemiological  and / or clinical management purposes  to differentiate between  SARS-CoV-2 and other Sarbecovirus currently known to infect humans.  If clinically indicated additional testing with an alternate test  methodology [id number]) is advised. The SARS-CoV-2 RNA is generally  detectable in upper and lower respiratory sp ecimens during the acute  phase of infection. The  expected result is Negative. Fact Sheet for Patients:  StrictlyIdeas.no Fact Sheet for Healthcare Providers: BankingDealers.co.za This test is not yet approved or cleared by the [place] FDA and has been authorized for detection and/or diagnosis of SARS-CoV-2 by FDA under an Emergency Use Authorization (EUA).  This EUA will remain in effect (meaning this test can be used) for the duration of the COVID-19 declaration under Section 564(b)(1) of the Act, 21 U.S.C. section 360bbb-3(b)(1), unless the authorization is terminated or revoked sooner. Performed at [facility], [address], [place], [place] [address]      Radiological Exams on Admission: Dg Chest Port 1 View  Result Date: 03/08/2019 CLINICAL DATA:  Shortness of breath EXAM: PORTABLE CHEST 1 VIEW COMPARISON:  12/27/2018 FINDINGS: Heart is borderline in size. No confluent airspace opacities, effusions or edema. No acute bony abnormality. IMPRESSION: No active disease. Electronically Signed   By: Rolm Baptise M.D.   On: 03/08/2019 17:04    EKG: Independently reviewed. Sinus with QTc of 557  Assessment/Plan Principal Problem:   Acute renal failure (ARF) (HCC) Active Problems:   PVD (peripheral vascular disease) with claudication (HCC)   Hyperlipidemia   Normocytic anemia   Chronic pain syndrome   CKD (chronic kidney disease) stage 3, GFR 30-59 ml/min (HCC)   Hypokalemia   1. Acute on Chronic renal failure stage 3 1. Historically baseline Cr between 1.3 and 2.1 with presenting Cr of 3.66 2. Patient reports poor PO intake and appears clinically dry on exam 3. Will continue patient on IVF at 75cc/hr and repeat BMET in AM 4. Will check renal US 2. Hypokalemia 1. Pt given 17mEq KCl in ED 2. Normal Mg level 3. Will given additional 54mEq and repeat bmet in AM 3. Normocytic anemia 1. Heme neg in ED 2. Baseline hgb  of around 9, currently 8 at time of presentation  3. Will check iron studies 4. Repeat CBC in AM 5. Cont iron supplement per home regimen 4. Chronic pain syndrome 1. Cont on home ultram for now 2. Will decrease neurontin dose given above ARF 5. HLD 1. Cont statin per home regimen 6. PVD 1. Will cont ASA and plavix 7. Weakness 1. Suspect related to above ARF 2. Will consult PT/OT 8. Low grade temp 1. Tmax of 100F in ED 2. UA, CXR unremarkable. No leukocytosis 3. COVID-19 is neg 4. Will hold off on antimicrobial for now. Cont to monitor 9. HTN 1. BP currently controlled 2. Will cont home meds as tolerated 10. Acute toxic metabolic encephalopathy 1. Seems confused and is poor historian 2. Suspect related to above renal failure 3. Cont IVF per above. Repeat renal panel in AM 11. Active tobacco abuse 1. Recommend cessation when no longer encephalopathic  DVT prophylaxis: Heparin subq  Code Status: Full Family Communication: Pt in room, discussed with patient's son over phone Disposition Plan: Uncertain at this time  Consults called:  Admission status: Inpatient as would likely require greater than 2 midnight stay for IVF hydration to resolve renal failure   Marylu Lund MD [facility] Pager On Amion  If 7PM-7AM, please contact night-coverage  03/08/2019, 5:31 PM

## 2019-03-08 NOTE — ED Triage Notes (Signed)
Pt arrives by EMS with complaints of generalized overall weakness X2 weeks. Pt is A/OX3 place self and situation but EMS reports that is her baseline.   TEMP 100.0 rectal

## 2019-03-08 NOTE — ED Provider Notes (Signed)
Pt signed out by Dr. Francia Greaves pending labs.  Pt does have hypokalemia which was replaced.  She has AKI, so she was given IVFs.  Hgb is low.  I checked her stool and it was negative for blood.  She did have a low grade fever, but normal ua, cxr, and neg for covid.  Pt was d/w Dr. Wyline Copas ([facility]) who will admit.  I did call her son, [name], and gave him an update per pt's request.   Isla Pence, MD 03/08/19 [phone]

## 2019-03-08 NOTE — ED Notes (Signed)
Updated pt son per pt request.

## 2019-03-09 ENCOUNTER — Inpatient Hospital Stay (HOSPITAL_COMMUNITY): Payer: Medicare HMO

## 2019-03-09 DIAGNOSIS — I739 Peripheral vascular disease, unspecified: Secondary | ICD-10-CM

## 2019-03-09 DIAGNOSIS — E876 Hypokalemia: Secondary | ICD-10-CM

## 2019-03-09 DIAGNOSIS — E785 Hyperlipidemia, unspecified: Secondary | ICD-10-CM

## 2019-03-09 LAB — CBC
HCT: 24.6 % — ABNORMAL LOW (ref 36.0–46.0)
Hemoglobin: 8.5 g/dL — ABNORMAL LOW (ref 12.0–15.0)
MCH: 28.7 pg (ref 26.0–34.0)
MCHC: 34.6 g/dL (ref 30.0–36.0)
MCV: 83.1 fL (ref 80.0–100.0)
Platelets: 267 10*3/uL (ref 150–400)
RBC: 2.96 MIL/uL — ABNORMAL LOW (ref 3.87–5.11)
RDW: 15.2 % (ref 11.5–15.5)
WBC: 9 10*3/uL (ref 4.0–10.5)
nRBC: 0 % (ref 0.0–0.2)

## 2019-03-09 LAB — COMPREHENSIVE METABOLIC PANEL
ALT: 8 U/L (ref 0–44)
AST: 16 U/L (ref 15–41)
Albumin: 3 g/dL — ABNORMAL LOW (ref 3.5–5.0)
Alkaline Phosphatase: 60 U/L (ref 38–126)
Anion gap: 13 (ref 5–15)
BUN: 46 mg/dL — ABNORMAL HIGH (ref 8–23)
CO2: 10 mmol/L — ABNORMAL LOW (ref 22–32)
Calcium: 8.6 mg/dL — ABNORMAL LOW (ref 8.9–10.3)
Chloride: 115 mmol/L — ABNORMAL HIGH (ref 98–111)
Creatinine, Ser: 3.33 mg/dL — ABNORMAL HIGH (ref 0.44–1.00)
GFR calc Af Amer: 15 mL/min — ABNORMAL LOW (ref >60)
GFR calc non Af Amer: 13 mL/min — ABNORMAL LOW (ref >60)
Glucose, Bld: 84 mg/dL (ref 70–99)
Potassium: 3.4 mmol/L — ABNORMAL LOW (ref 3.5–5.1)
Sodium: 138 mmol/L (ref 135–145)
Total Bilirubin: 0.4 mg/dL (ref 0.3–1.2)
Total Protein: 5.8 g/dL — ABNORMAL LOW (ref 6.5–8.1)

## 2019-03-09 LAB — IRON AND TIBC
Iron: 96 ug/dL (ref 28–170)
Saturation Ratios: 43 % — ABNORMAL HIGH (ref 10.4–31.8)
TIBC: 225 ug/dL — ABNORMAL LOW (ref 250–450)
UIBC: 129 ug/dL

## 2019-03-09 MED ORDER — ENSURE ENLIVE PO LIQD
237.0000 mL | Freq: Three times a day (TID) | ORAL | Status: DC
  Administered 2019-03-09 – 2019-03-11 (×7): 237 mL via ORAL

## 2019-03-09 MED ORDER — NICOTINE 21 MG/24HR TD PT24
21.0000 mg | MEDICATED_PATCH | Freq: Every day | TRANSDERMAL | Status: DC
  Administered 2019-03-09 – 2019-03-12 (×4): 21 mg via TRANSDERMAL
  Filled 2019-03-09 (×4): qty 1

## 2019-03-09 NOTE — Evaluation (Signed)
Physical Therapy Evaluation Patient Details Name: Alyssa [name] MRN: [id number] DOB: September 29, 1941 Today's Date: 03/09/2019   History of Present Illness  Pt is a 78 y/o female admitted secondary to worsening weakness. Pt found to have an acute on chronic kidney injury. Of note, pt recently admitted secondary to PNA and fall at home (no injuries). PMH including but not limited to CKD, HTN and PVD.    Clinical Impression  Pt presented supine in bed with HOB elevated, awake and willing to participate in therapy session. Prior to admission, pt reported that she ambulates with use of a walker and requires a lot of assistance from her "friend". Pt unable to provide much reliable information or more insight into her situation PTA. At the time of evaluation, pt very limited secondary to weakness, fatigue and bowel incontinence. Pt would continue to benefit from skilled physical therapy services at this time while admitted and after d/c to address the below listed limitations in order to improve overall safety and independence with functional mobility.     Follow Up Recommendations SNF    Equipment Recommendations  None recommended by PT    Recommendations for Other Services       Precautions / Restrictions Precautions Precautions: Fall Precaution Comments: incontinent of bowel Restrictions Weight Bearing Restrictions: No      Mobility  Bed Mobility Overal bed mobility: Needs Assistance Bed Mobility: Supine to Sit     Supine to sit: Min assist     General bed mobility comments: use of bed pads to position pt's hips at EOB, HOB elevated  Transfers Overall transfer level: Needs assistance Equipment used: Rolling walker (2 wheeled) Transfers: Sit to/from Stand Sit to Stand: +2 safety/equipment;Min assist         General transfer comment: cueing for safe hand placement, increased time and effort, min A for stability with transition into standing from  EOB  Ambulation/Gait Ambulation/Gait assistance: Min guard Gait Distance (Feet): 30 Feet Assistive device: Rolling walker (2 wheeled) Gait Pattern/deviations: Step-through pattern;Decreased stride length;Shuffle;Trunk flexed Gait velocity: decreased   General Gait Details: limited secondary to pt being incontinent of bowels; ambulated in room and to bathroom with RW and close min guard; pt also maintaining R ankle in DF which she reported was due to a LLD  Stairs            Wheelchair Mobility    Modified Rankin (Stroke Patients Only)       Balance Overall balance assessment: Needs assistance Sitting-balance support: Feet supported Sitting balance-Leahy Scale: Fair     Standing balance support: Bilateral upper extremity supported;Single extremity supported Standing balance-Leahy Scale: Poor                               Pertinent Vitals/Pain Pain Assessment: No/denies pain    Home Living Family/patient expects to be discharged to:: Private residence Living Arrangements: Non-relatives/Friends     Home Access: Stairs to enter Entrance Stairs-Rails: Psychiatric nurse of Steps: 3 Home Layout: One level Home Equipment: Environmental consultant - 2 wheels;Walker - 4 wheels;Bedside commode      Prior Function Level of Independence: Needs assistance   Gait / Transfers Assistance Needed: pt ambulates with a RW  ADL's / Homemaking Assistance Needed: requires assistance         Hand Dominance        Extremity/Trunk Assessment   Upper Extremity Assessment Upper Extremity Assessment: Defer to OT evaluation  Lower Extremity Assessment Lower Extremity Assessment: Generalized weakness;Difficult to assess due to impaired cognition       Communication   Communication: HOH  Cognition Arousal/Alertness: Awake/alert Behavior During Therapy: Flat affect Overall Cognitive Status: Impaired/Different from baseline Area of Impairment: Memory;Following  commands;Safety/judgement;Problem solving                     Memory: Decreased short-term memory Following Commands: Follows one step commands with increased time Safety/Judgement: Decreased awareness of deficits;Decreased awareness of safety   Problem Solving: Slow processing;Decreased initiation;Difficulty sequencing;Requires verbal cues;Requires tactile cues        General Comments      Exercises     Assessment/Plan    PT Assessment Patient needs continued PT services  PT Problem List Decreased activity tolerance;Decreased balance;Decreased mobility;Decreased cognition;Decreased knowledge of use of DME;Decreased safety awareness;Decreased knowledge of precautions       PT Treatment Interventions DME instruction;Gait training;Stair training;Functional mobility training;Therapeutic activities;Therapeutic exercise;Neuromuscular re-education;Balance training;Cognitive remediation;Patient/family education    PT Goals (Current goals can be found in the Care Plan section)  Acute Rehab PT Goals Patient Stated Goal: to sleep PT Goal Formulation: With patient Time For Goal Achievement: 03/23/19 Potential to Achieve Goals: Good    Frequency Min 2X/week   Barriers to discharge        Co-evaluation PT/OT/SLP Co-Evaluation/Treatment: Yes Reason for Co-Treatment: To address functional/ADL transfers;For patient/therapist safety PT goals addressed during session: Mobility/safety with mobility;Balance;Proper use of DME;Strengthening/ROM         AM-PAC PT "6 Clicks" Mobility  Outcome Measure Help needed turning from your back to your side while in a flat bed without using bedrails?: A Little Help needed moving from lying on your back to sitting on the side of a flat bed without using bedrails?: A Little Help needed moving to and from a bed to a chair (including a wheelchair)?: A Little Help needed standing up from a chair using your arms (e.g., wheelchair or bedside  chair)?: A Little Help needed to walk in hospital room?: A Little Help needed climbing 3-5 steps with a railing? : A Lot 6 Click Score: 17    End of Session Equipment Utilized During Treatment: Gait belt Activity Tolerance: Patient tolerated treatment well Patient left: in chair;with call bell/phone within reach;with chair alarm set Nurse Communication: Mobility status PT Visit Diagnosis: Muscle weakness (generalized) (M62.81)    Time: 3382-5053 PT Time Calculation (min) (ACUTE ONLY): 25 min   Charges:   PT Evaluation $PT Eval Moderate Complexity: [place], PT, DPT  Acute Rehabilitation Services Pager [phone] Office [place] 03/09/2019, 1:29 PM

## 2019-03-09 NOTE — Progress Notes (Addendum)
PROGRESS NOTE  Alyssa [name] LKG:[id number] DOB: 11-Dec-1940 DOA: 03/08/2019 PCP: Everardo Beals, NP   LOS: 1 day   Brief narrative:  Alyssa [name] is a 78 y.o. female with medical history significant of stage 3 CKD, normocytic anemia, HTN, active tobacco abuse who presented to the hospital with complaints of decreased appetite, increased weakness and increasing confusion.. Pt was seen at urgent care where she was prescribed a trial of Aricept. Patient also noted to have intermittent LE swelling. Patient's son reported that the patient had recent fall 3-4 days prior to admit resulting in injury to hip. Pt was seen at urgent care and reportedly had hip xray neg for fracture. In the ED, patient noted to have initial temp of 100F orally and rectally. Potassium was noted to be 2.5 with Cr in excess of 3.66 with BUN of 48. Hgb was noted to be 8.0 with MCV of 88.8. Hemoccult was neg in ED. COVID-19 was noted to be negative.  Subjective: Today patient was seen at bedside.  Patient is a poor historian.  Denies any nausea, vomiting or abdominal pain.  Nursing staff reported poor oral intake.  Assessment/Plan:  Principal Problem:   Acute renal failure (ARF) (HCC) Active Problems:   PVD (peripheral vascular disease) with claudication (HCC)   Hyperlipidemia   Normocytic anemia   Chronic pain syndrome   CKD (chronic kidney disease) stage 3, GFR 30-59 ml/min (HCC)   Hypokalemia  Acute kidney injury on CKD stage III.  Likely secondary to poor oral intake and volume depletion.  Continue on IV fluids.  Check BMP in a.m. Renal ultrasound was performed which showed echogenic kidney compatible with chronic medical renal disease.  Nutrition evaluation.  Hypokalemia.  Continue potassium supplements.  Has improved today.  Magnesium was 2.0 yesterday.  Check BMP in a.m.  normocytic anemia.  Baseline hemoglobin of around 9.  Check iron studies.  Severely operatively  Chronic pain syndrome.   Continue Ultram.  Decrease in Neurontin dosage.  Hyperlipidemia.  Continue statins.  Peripheral vascular disease.  Continue aspirin and Plavix.  Stable at this time.  Generalized weakness.  Likely secondary to volume depletion and acute kidney injury.  Will get physical therapy Occupational Therapy evaluation.  Low-grade fever in the ED.  Likely to be infective in nature.  Will closely monitor.  No leukocytosis.   Chest x-ray was negative for infiltrate.  Urinalysis negative for infection but will hold off on the antibiotics.  Hypertension.  Continue metoprolol, BiDil.  Monitor blood pressure closely.  Acute metabolic encephalopathy likely secondary to acute kidney injury will depletion.  Patient is a poor historian at baseline.Aricept on hold.  Failure to thrive.  Poor oral intake.  I spoke with the patient's son on the phone.  Patient did have endoscopy and colonoscopy recently without any significant findings.  He was unable to tell where it was performed.  Will get CT scan of the abdomen and pelvis.  Will get nutrition evaluation.  Chronic cigarette smoking.  We will put the patient on nicotine patch.  VTE Prophylaxis: heparin subq  Code Status:  Full  Family Communication: I spoke with the patient's son and updated him about the clinical condition of the patient.  Disposition Plan: Unable to determine at this time.  Will get physical therapy, occupational therapy evaluation.  Consultants:  None  Procedures:  None  Antibiotics: Anti-infectives (From admission, onward)   None      Objective: Vitals:   03/09/19 0437 03/09/19 0917  BP:  107/65 105/67  Pulse: 62 63  Resp: 18 16  Temp: 98 F (36.7 C) 97.7 F (36.5 C)  SpO2: 99% 100%    Intake/Output Summary (Last 24 hours) at 03/09/2019 1111 Last data filed at 03/09/2019 0900 Gross per 24 hour  Intake 807.64 ml  Output 0 ml  Net 807.64 ml   Filed Weights   03/08/19 1416 03/08/19 2002  Weight: 45.4 kg 33.5 kg    Body mass index is 13.51 kg/m.   Physical Exam: GENERAL: Patient is alert awake and communicative, oriented to self, not in obvious distress. HENT: No scleral pallor or icterus. Pupils equally reactive to light. Oral mucosa is dry NECK: is supple, no palpable thyroid enlargement. CHEST: Clear to auscultation. No crackles or wheezes. Non tender on palpation. Diminished breath sounds bilaterally. CVS: S1 and S2 heard, no murmur. Regular rate and rhythm. No pericardial rub. ABDOMEN: Soft, non-tender, bowel sounds are present. No palpable hepato-splenomegaly. EXTREMITIES: No edema. CNS: Moving all extremities.  Appears confused SKIN: warm and dry without rashes.  Diminished skin turgor  Data Review: I have personally reviewed the following laboratory data and studies,  CBC: Recent Labs  Lab 03/08/19 1518 03/09/19 0611  WBC 6.2 9.0  NEUTROABS 3.6  --   HGB 8.0* 8.5*  HCT 25.3* 24.6*  MCV 88.8 83.1  PLT 193 465   Basic Metabolic Panel: Recent Labs  Lab 03/08/19 1518 03/08/19 1612 03/09/19 0611  NA 135  --  138  K 2.5*  --  3.4*  CL 112*  --  115*  CO2 12*  --  10*  GLUCOSE 89  --  84  BUN 48*  --  46*  CREATININE 3.66*  --  3.33*  CALCIUM 8.9  --  8.6*  MG  --  2.0  --    Liver Function Tests: Recent Labs  Lab 03/08/19 1518 03/09/19 0611  AST 20 16  ALT 9 8  ALKPHOS 69 60  BILITOT 0.6 0.4  PROT 6.7 5.8*  ALBUMIN 3.6 3.0*   Recent Labs  Lab 03/08/19 1518  LIPASE 25   No results for input(s): AMMONIA in the last 168 hours. Cardiac Enzymes: No results for input(s): CKTOTAL, CKMB, CKMBINDEX, TROPONINI in the last 168 hours. BNP (last 3 results) Recent Labs    12/09/18 1301 12/10/18 0618 12/11/18 0424  BNP 736.6* 744.1* 471.1*    ProBNP (last 3 results) No results for input(s): PROBNP in the last 8760 hours.  CBG: No results for input(s): GLUCAP in the last 168 hours. Recent Results (from the past 240 hour(s))  SARS Coronavirus 2 (CEPHEID -  Performed in [place] hospital lab), Hosp Order     Status: None   Collection Time: 03/08/19  2:58 PM  Result Value Ref Range Status   SARS Coronavirus 2 NEGATIVE NEGATIVE Final    Comment: (NOTE) If result is NEGATIVE SARS-CoV-2 target nucleic acids are NOT DETECTED. The SARS-CoV-2 RNA is generally detectable in upper and lower  respiratory specimens during the acute phase of infection. The lowest  concentration of SARS-CoV-2 viral copies this assay can detect is 250  copies / mL. A negative result does not preclude SARS-CoV-2 infection  and should not be used as the sole basis for treatment or other  patient management decisions.  A negative result may occur with  improper specimen collection / handling, submission of specimen other  than nasopharyngeal swab, presence of viral mutation(s) within the  areas targeted by this assay, and inadequate number  of viral copies  (<250 copies / mL). A negative result must be combined with clinical  observations, patient history, and epidemiological information. If result is POSITIVE SARS-CoV-2 target nucleic acids are DETECTED. The SARS-CoV-2 RNA is generally detectable in upper and lower  respiratory specimens dur ing the acute phase of infection.  Positive  results are indicative of active infection with SARS-CoV-2.  Clinical  correlation with patient history and other diagnostic information is  necessary to determine patient infection status.  Positive results do  not rule out bacterial infection or co-infection with other viruses. If result is PRESUMPTIVE POSTIVE SARS-CoV-2 nucleic acids MAY BE PRESENT.   A presumptive positive result was obtained on the submitted specimen  and confirmed on repeat testing.  While 2019 novel coronavirus  (SARS-CoV-2) nucleic acids may be present in the submitted sample  additional confirmatory testing may be necessary for epidemiological  and / or clinical management purposes  to differentiate between   SARS-CoV-2 and other Sarbecovirus currently known to infect humans.  If clinically indicated additional testing with an alternate test  methodology [phone]) is advised. The SARS-CoV-2 RNA is generally  detectable in upper and lower respiratory sp ecimens during the acute  phase of infection. The expected result is Negative. Fact Sheet for Patients:  StrictlyIdeas.no Fact Sheet for Healthcare Providers: BankingDealers.co.za This test is not yet approved or cleared by the [place] FDA and has been authorized for detection and/or diagnosis of SARS-CoV-2 by FDA under an Emergency Use Authorization (EUA).  This EUA will remain in effect (meaning this test can be used) for the duration of the COVID-19 declaration under Section 564(b)(1) of the Act, 21 U.S.C. section 360bbb-3(b)(1), unless the authorization is terminated or revoked sooner. Performed at [facility], [place] [address] [place], [place] [address]   Culture, blood (routine x 2)     Status: None (Preliminary result)   Collection Time: 03/08/19  3:00 PM  Result Value Ref Range Status   Specimen Description BLOOD RIGHT ANTECUBITAL  Final   Special Requests   Final    BOTTLES DRAWN AEROBIC AND ANAEROBIC Blood Culture adequate volume   Culture   Final    NO GROWTH < 24 HOURS Performed at [facility], [place] [address], [place], [place] [address]    Report Status PENDING  Incomplete  Culture, blood (routine x 2)     Status: None (Preliminary result)   Collection Time: 03/08/19  3:10 PM  Result Value Ref Range Status   Specimen Description BLOOD RIGHT ARM  Final   Special Requests   Final    BOTTLES DRAWN AEROBIC ONLY Blood Culture adequate volume   Culture   Final    NO GROWTH < 24 HOURS Performed at [facility], [place] [address], [place], [place] [address]    Report Status PENDING  Incomplete     Studies: US Renal  Result Date: 03/08/2019 CLINICAL DATA:   78 year old female acute renal failure. EXAM: RENAL / URINARY TRACT ULTRASOUND COMPLETE COMPARISON:  Lumbar spine CT 05/17/2016. FINDINGS: Right Kidney: Renal measurements: 6.9 x 2.5 x 3.6 centimeters = volume: 33 mL. Small echogenic right kidney (image 8). No hydronephrosis. Small simple appearing 8 millimeters cyst. Left Kidney: Renal measurements: 10.0 x 4.9 x 4.8 centimeters = volume: 22 mL. Echogenic left kidney (image 18). Mild asymmetric prominence of the left renal collecting system (image 27), but no overt hydronephrosis. Small 8 millimeter simple appearing cyst. Bladder: Appears normal for degree of bladder  distention. IMPRESSION: 1. Echogenic kidneys compatible with chronic medical renal disease. Asymmetric right renal atrophy. 2. Asymmetrically larger left renal collecting system, but no overt hydronephrosis. Electronically Signed   By: Genevie Ann M.D.   On: 03/08/2019 19:45   Dg Chest Port 1 View  Result Date: 03/08/2019 CLINICAL DATA:  Shortness of breath EXAM: PORTABLE CHEST 1 VIEW COMPARISON:  12/27/2018 FINDINGS: Heart is borderline in size. No confluent airspace opacities, effusions or edema. No acute bony abnormality. IMPRESSION: No active disease. Electronically Signed   By: Rolm Baptise M.D.   On: 03/08/2019 17:04    Scheduled Meds: . aspirin EC  81 mg Oral Daily  . citalopram  40 mg Oral Daily  . clopidogrel  75 mg Oral Daily  . ferrous sulfate  325 mg Oral Q breakfast  . gabapentin  100 mg Oral BID  . heparin  5,000 Units Subcutaneous Q8H  . isosorbide mononitrate  60 mg Oral Daily  . isosorbide-hydrALAZINE  2 tablet Oral TID  . metoprolol tartrate  12.5 mg Oral BID  . rosuvastatin  40 mg Oral QHS  . valproic acid  250 mg Oral BID    Continuous Infusions: . sodium chloride 75 mL/hr at 03/09/19 0122     Flora Lipps, MD  [facility] 03/09/2019

## 2019-03-09 NOTE — Progress Notes (Signed)
New Admission Note: ? Arrival Method:Stretcher Mental Orientation: AXOX3 Telemetry: No Assessment: Completed Skin: Refer to flowsheet IV: LAC, R hand anterior Pain: 8  Tubes: None Safety Measures: Safety Fall Prevention Plan discussed with patient. Admission: Completed [facility] [place] Orientation: Patient has been orientated to the room, unit and the staff. Family: None at bedside Orders have been reviewed and are being implemented. Will continue to monitor the patient. Call light has been placed within reach and bed alarm has been activated.  ? Denton Ar ), RN  Phone Number: [phone]

## 2019-03-09 NOTE — Evaluation (Signed)
Occupational Therapy Evaluation Patient Details Name: Alyssa [name] MRN: [id number] DOB: 01-Jun-1941 Today's Date: 03/09/2019    History of Present Illness Pt is a 78 y/o female admitted secondary to worsening weakness. Pt found to have an acute on chronic kidney injury. Of note, pt recently admitted secondary to PNA and fall at home (no injuries). PMH including but not limited to CKD, HTN and PVD.   Clinical Impression   Pt PTA: pt unable to verbalize PLOF. Info already in chart and pt agreed to it, but unable to elaborate. Pt currently, limited by min to modA for sit to stand and mobility in room with RW. Pt incontinent today of B/B and rushing to bathroom for BM with OT/PT in room. Pt set-upA for UB ADL and minA for LB ADL. Pt following commands and reporting no pain. Pt would greatly benefit from continued OT skilled services for ADL, mobility and safety in SNF setting. OT to follow acutely for decreased strength, mobility and safety.        Follow Up Recommendations  SNF;Supervision/Assistance - 24 hour    Equipment Recommendations  Other (comment)(to be determined)    Recommendations for Other Services       Precautions / Restrictions Precautions Precautions: Fall Precaution Comments: incontinent of bowel Restrictions Weight Bearing Restrictions: No      Mobility Bed Mobility Overal bed mobility: Needs Assistance Bed Mobility: Supine to Sit     Supine to sit: Min assist     General bed mobility comments: use of bed pads to position pt's hips at EOB, HOB elevated  Transfers Overall transfer level: Needs assistance Equipment used: Rolling walker (2 wheeled) Transfers: Sit to/from Stand Sit to Stand: +2 safety/equipment;Min assist         General transfer comment: cueing for safe hand placement, increased time and effort, min A for stability with transition into standing from EOB    Balance Overall balance assessment: Needs assistance Sitting-balance  support: Feet supported Sitting balance-Leahy Scale: Fair     Standing balance support: Bilateral upper extremity supported;Single extremity supported Standing balance-Leahy Scale: Poor                             ADL either performed or assessed with clinical judgement   ADL Overall ADL's : Needs assistance/impaired Eating/Feeding: Modified independent;Sitting   Grooming: Set up;Sitting   Upper Body Bathing: Set up;Sitting   Lower Body Bathing: Minimal assistance;Sit to/from stand;Sitting/lateral leans   Upper Body Dressing : Set up;Sitting   Lower Body Dressing: Minimal assistance;Sitting/lateral leans;Sit to/from stand   Toilet Transfer: Minimal assistance;Comfort height toilet;Grab bars   Toileting- Clothing Manipulation and Hygiene: Moderate assistance;Sitting/lateral lean;Sit to/from stand;Cueing for safety       Functional mobility during ADLs: Minimal assistance;+2 for physical assistance;Rolling walker General ADL Comments: Pt performing grooming at sink with set-upA; toilet hygiene modA overall.     Vision Baseline Vision/History: No visual deficits Vision Assessment?: No apparent visual deficits     Perception     Praxis      Pertinent Vitals/Pain Pain Assessment: No/denies pain     Hand Dominance     Extremity/Trunk Assessment Upper Extremity Assessment Upper Extremity Assessment: Generalized weakness   Lower Extremity Assessment Lower Extremity Assessment: Defer to PT evaluation;Generalized weakness   Cervical / Trunk Assessment Cervical / Trunk Assessment: Kyphotic   Communication Communication Communication: HOH   Cognition Arousal/Alertness: Awake/alert Behavior During Therapy: Flat affect Overall Cognitive Status: Impaired/Different  from baseline Area of Impairment: Memory;Following commands;Safety/judgement;Problem solving                     Memory: Decreased short-term memory Following Commands: Follows one  step commands with increased time Safety/Judgement: Decreased awareness of deficits;Decreased awareness of safety   Problem Solving: Slow processing;Decreased initiation;Difficulty sequencing;Requires verbal cues;Requires tactile cues     General Comments       Exercises     Shoulder Instructions      Home Living Family/patient expects to be discharged to:: Private residence Living Arrangements: Non-relatives/Friends     Home Access: Stairs to enter Technical brewer of Steps: 3 Entrance Stairs-Rails: Right;Left Home Layout: One level     Bathroom Shower/Tub: Teacher, early years/pre: Standard     Home Equipment: Environmental consultant - 2 wheels;Walker - 4 wheels;Bedside commode          Prior Functioning/Environment Level of Independence: Needs assistance  Gait / Transfers Assistance Needed: pt ambulates with a RW ADL's / Homemaking Assistance Needed: requires assistance             OT Problem List: Decreased strength;Decreased activity tolerance;Impaired balance (sitting and/or standing);Decreased safety awareness;Pain      OT Treatment/Interventions: Self-care/ADL training;Therapeutic exercise;Neuromuscular education;Therapeutic activities;Patient/family education;Balance training    OT Goals(Current goals can be found in the care plan section) Acute Rehab OT Goals Patient Stated Goal: to sleep OT Goal Formulation: With patient Time For Goal Achievement: 03/23/19 Potential to Achieve Goals: Good  OT Frequency: Min 3X/week   Barriers to D/C:            Co-evaluation PT/OT/SLP Co-Evaluation/Treatment: Yes Reason for Co-Treatment: For patient/therapist safety;To address functional/ADL transfers PT goals addressed during session: Mobility/safety with mobility;Balance;Proper use of DME;Strengthening/ROM OT goals addressed during session: ADL's and self-care      AM-PAC OT "6 Clicks" Daily Activity     Outcome Measure Help from another person eating  meals?: None Help from another person taking care of personal grooming?: A Little Help from another person toileting, which includes using toliet, bedpan, or urinal?: A Little Help from another person bathing (including washing, rinsing, drying)?: A Lot Help from another person to put on and taking off regular upper body clothing?: A Little Help from another person to put on and taking off regular lower body clothing?: A Lot 6 Click Score: 17   End of Session Equipment Utilized During Treatment: Gait belt;Rolling walker Nurse Communication: Mobility status  Activity Tolerance: Patient tolerated treatment well Patient left: in chair;with call bell/phone within reach;with chair alarm set  OT Visit Diagnosis: Unsteadiness on feet (R26.81);Muscle weakness (generalized) (M62.81)                Time: 2956-2130 OT Time Calculation (min): 30 min Charges:  OT General Charges $OT Visit: 1 Visit OT Evaluation $OT Eval Moderate Complexity: 1 Mod  Darryl Nestle) Marsa Aris OTR/L Acute Rehabilitation Services Pager: [phone] Office: [place] 03/09/2019, 3:39 PM

## 2019-03-09 NOTE — Progress Notes (Signed)
Initial Nutrition Assessment   RD working remotely.  DOCUMENTATION CODES:   Underweight  INTERVENTION:  Provide Ensure Enlive po TID, each supplement provides 350 kcal and 20 grams of protein.  Encourage adequate PO intake.   NUTRITION DIAGNOSIS:   Increased nutrient needs related to acute illness(ARF) as evidenced by estimated needs.  GOAL:   Patient will meet greater than or equal to 90% of their needs  MONITOR:   PO intake, Supplement acceptance, Labs, Weight trends, I & O's, Skin  REASON FOR ASSESSMENT:   Consult Assessment of nutrition requirement/status  ASSESSMENT:   78 y.o. female with medical history significant of stage 3 CKD, normocytic anemia, HTN, active tobacco abuse. Presents with weakness and confusion. Pt admitted with acute on chronic renal failure stage 3 and normocytic anemia. COVID negative.   Per MD, pt with acute toxic metabolic encephalopathy, confused, and poor historian. RD unable to obtain pt nutrition history. Meal completion less than 25% this morning at breakfast. Per weight records, pt with a 19% weight loss in 2 months, significant for time frame. RD to order nutritional supplements to aid in caloric and protein needs.   Unable to complete Nutrition-Focused physical exam at this time.   Labs and medications reviewed.  Ferrous sulfate 325 mg daily.   Diet Order:   Diet Order            Diet regular Room service appropriate? Yes; Fluid consistency: Thin  Diet effective now              EDUCATION NEEDS:   Not appropriate for education at this time  Skin:  Skin Assessment: Reviewed RN Assessment  Last BM:  Unknown  Height:   Ht Readings from Last 1 Encounters:  03/08/19 5\' 2"  (1.575 m)    Weight:   Wt Readings from Last 1 Encounters:  03/08/19 33.5 kg    Ideal Body Weight:  50 kg  BMI:  Body mass index is 13.51 kg/m.  Estimated Nutritional Needs:   Kcal:  1300-1500  Protein:  50-65 grams  Fluid:  >/= 1.5  L/day    Corrin Parker, MS, RD, LDN Pager # [phone] After hours/ weekend pager # [phone]

## 2019-03-10 LAB — BASIC METABOLIC PANEL
Anion gap: 12 (ref 5–15)
BUN: 44 mg/dL — ABNORMAL HIGH (ref 8–23)
CO2: 7 mmol/L — ABNORMAL LOW (ref 22–32)
Calcium: 8.3 mg/dL — ABNORMAL LOW (ref 8.9–10.3)
Chloride: 112 mmol/L — ABNORMAL HIGH (ref 98–111)
Creatinine, Ser: 2.75 mg/dL — ABNORMAL HIGH (ref 0.44–1.00)
GFR calc Af Amer: 19 mL/min — ABNORMAL LOW (ref >60)
GFR calc non Af Amer: 16 mL/min — ABNORMAL LOW (ref >60)
Glucose, Bld: 103 mg/dL — ABNORMAL HIGH (ref 70–99)
Potassium: 3.3 mmol/L — ABNORMAL LOW (ref 3.5–5.1)
Sodium: 131 mmol/L — ABNORMAL LOW (ref 135–145)

## 2019-03-10 LAB — CBC
HCT: 25.4 % — ABNORMAL LOW (ref 36.0–46.0)
Hemoglobin: 8.3 g/dL — ABNORMAL LOW (ref 12.0–15.0)
MCH: 28.7 pg (ref 26.0–34.0)
MCHC: 32.7 g/dL (ref 30.0–36.0)
MCV: 87.9 fL (ref 80.0–100.0)
Platelets: 309 10*3/uL (ref 150–400)
RBC: 2.89 MIL/uL — ABNORMAL LOW (ref 3.87–5.11)
RDW: 15.6 % — ABNORMAL HIGH (ref 11.5–15.5)
WBC: 10.6 10*3/uL — ABNORMAL HIGH (ref 4.0–10.5)
nRBC: 0.2 % (ref 0.0–0.2)

## 2019-03-10 MED ORDER — MAGNESIUM OXIDE 400 (241.3 MG) MG PO TABS
400.0000 mg | ORAL_TABLET | Freq: Two times a day (BID) | ORAL | Status: DC
  Administered 2019-03-10 – 2019-03-12 (×5): 400 mg via ORAL
  Filled 2019-03-10 (×5): qty 1

## 2019-03-10 NOTE — TOC Initial Note (Addendum)
Transition of Care [facility]) - Initial/Assessment Note    Patient Details  Name: Alyssa [name] MRN: [id number] Date of Birth: [birth year]  Transition of Care [facility]) CM/SW Contact:    Gelene Mink, [place] Phone Number: 03/10/2019, 3:15 PM  Clinical Narrative:                  CSW called and spoke with the patient's son Addalie Calles. CSW introduced herself and explained her role. Mr. Bettes stated that he explained to the doctor the other day that he wanted to speak with his brother regarding SNF placement for his mother. CSW stated that would be acceptable if he could let her know no later than Sunday. Patient's son then asked if his mother could receive the same therapy at home instead of going to a SNF. CSW stated that they could have home health services. CSW explained the home health process. CSW sent the patient's son home health scores via e-mail. CSW placed a copy on the patient's chart.   The patient has a rolling walker and regular walker at home.   CSW will continue to assist with disposition.   Expected Discharge Plan: [place] Barriers to Discharge: Continued Medical Work up   Patient Goals and CMS Choice Patient states their goals for this hospitalization and ongoing recovery are:: Pt son would like his mother to receive therapy at home CMS Medicare.gov Compare Post Acute Care list provided to:: Other (Comment Required)(Pt son, Kerina [place]one[place]) Choice offered to / list presented to : Adult Children  Expected Discharge Plan and Services Expected Discharge Plan: [place] In-house Referral: Clinical Social Work   Post Acute Care Choice: [place] arrangements for the past 2 months: [place], [place]                 DME Arranged: N/A DME Agency: NA       HH Arranged: NA [place] Agency: NA        Prior Living Arrangements/Services Living arrangements for the past 2 months: [place], [place] Lives with:: Adult Children Patient language and need for interpreter reviewed:: No Do you feel safe going back to the place where you live?: Yes      Need for Family Participation in Patient Care: Yes (Comment) Care giver support system in place?: Yes (comment)   Criminal Activity/Legal Involvement Pertinent to Current Situation/Hospitalization: No - Comment as needed  Activities of Daily Living Home Assistive Devices/Equipment: Environmental consultant (specify type), Cane (specify quad or straight), Bedside commode/3-in-1 ADL Screening (condition at time of admission) Patient's cognitive ability adequate to safely complete daily activities?: Yes Is the patient deaf or have difficulty hearing?: No Does the patient have difficulty seeing, even when wearing glasses/contacts?: No Does the patient have difficulty concentrating, remembering, or making decisions?: No Patient able to express need for assistance with ADLs?: No Does the patient have difficulty dressing or bathing?: No Independently performs ADLs?: Yes (appropriate for developmental age) Does the patient have difficulty walking or climbing stairs?: Yes Weakness of Legs: Both Weakness of Arms/Hands: None  Permission Sought/Granted Permission sought to share information with : Case Manager Permission granted to share information with : Yes, Verbal Permission Granted  Share Information with NAME: Tiane Szydlowski  Permission granted to share info w AGENCY: Declined SNF  Permission granted to share info w Relationship: Son     Emotional Assessment Appearance:: Appears stated age Attitude/Demeanor/Rapport: Unable to Assess  Affect (typically observed): Unable to Assess Orientation: : Oriented to Self, Oriented to Place Alcohol / Substance Use: Not Applicable Psych Involvement: No (comment)  Admission diagnosis:  Dehydration [E86.0] Hypokalemia [E87.6] Weakness [R53.1] Acute renal failure (ARF) (HCC)  [N17.9] AKI (acute kidney injury) ([place]) [N17.9] Anemia, unspecified type [D64.9] Patient Active Problem List   Diagnosis Date Noted  . CKD (chronic kidney disease) stage 3, GFR 30-59 ml/min (HCC) 03/08/2019  . Acute renal failure (ARF) ([place]) 03/08/2019  . Hypokalemia 03/08/2019  . Palliative care by specialist   . Goals of care, counseling/discussion   . Altered mental status   . Encephalopathy, toxic 12/24/2018  . Aspiration pneumonia of both lower lobes due to gastric secretions ([place]) 12/24/2018  . Non-ST elevation (NSTEMI) myocardial infarction ([place])   . Chest pain   . Dyspnea on exertion   . Elevated troponin level 12/09/2018  . Chronic migraine 03/27/2018  . Lumbar radiculopathy 03/27/2018  . Gait abnormality 03/27/2018  . CKD (chronic kidney disease) stage 4, GFR 15-29 ml/min (HCC) 12/28/2016  . Normocytic anemia 12/28/2016  . Chronic pain syndrome 12/28/2016  . Benzodiazepine withdrawal ([place]) 12/28/2016  . Benzodiazepine withdrawal without complication ([place]) 62/83/1517  . Chronic right shoulder pain 12/06/2016  . Fall 09/17/2016  . Fracture of humeral head, closed, right, initial encounter 09/17/2016  . Rib fractures 09/17/2016  . Multiple falls 09/17/2016  . Severe muscle deconditioning 09/17/2016  . Failure to thrive in adult 09/17/2016  . Melena 04/25/2016  . Essential hypertension 04/25/2016  . Anxiety state 04/25/2016  . Tobacco abuse 04/25/2016  . Hyperlipidemia 04/25/2016  . Syncope 04/24/2016  . Syncope and collapse 04/24/2016  . Gait difficulty 01/12/2016  . Foot drop, left 01/12/2016  . Severe recurrent major depression without psychotic features ([place]) 08/28/2015  . Spondylolisthesis at L4-L5 level 07/30/2015  . PVD (peripheral vascular disease) with claudication ([place]) 07/29/2014  . Weakness-Bilateral arm/leg 07/29/2014  . Numbness-left leg 07/29/2014  . Swelling of limb-Legs 07/29/2014  . Atherosclerosis of native arteries of the extremities with  intermittent claudication 09/30/2011   PCP:  Everardo Beals, NP Pharmacy:   [facility] [place], [place] [address] Phone: [phone] Fax: [phone]     Social Determinants of Health (SDOH) Interventions    Readmission Risk Interventions Readmission Risk Prevention Plan 12/26/2018  Transportation Screening Complete  Medication Review (RN Care Manager) Complete  PCP or Specialist appointment within 3-5 days of discharge Not Complete  PCP/Specialist Appt Not Complete comments pt for snf. Snf MD to see as needed.  [place] or Home Care Consult Not Complete  HRI or Home Care Consult Pt Refusal Comments NA  SW Recovery Care/Counseling Consult Not Complete  SW Consult Not Complete Comments NA  Palliative Care Screening Complete  Skilled Nursing Facility Complete  Some recent data might be hidden

## 2019-03-10 NOTE — Progress Notes (Signed)
PROGRESS NOTE  Alyssa [name] QHU:[id number] DOB: [birth year] DOA: 03/08/2019 PCP: Everardo Beals, NP   LOS: 2 days   Brief narrative:  Alyssa [name] is a 78 y.o. female with medical history significant of stage 3 CKD, normocytic anemia, HTN, active tobacco abuse who presented to the hospital with complaints of decreased appetite, increased weakness and increasing confusion.. Pt was seen at urgent care where she was prescribed a trial of Aricept. Patient also noted to have intermittent LE swelling. Patient's son reported that the patient had recent fall 3-4 days prior to admit resulting in injury to hip. Pt was seen at urgent care and reportedly had hip xray neg for fracture. In the ED, patient noted to have initial temp of 100F orally and rectally. Potassium was noted to be 2.5 with Cr in excess of 3.66 with BUN of 48. Hgb was noted to be 8.0 with MCV of 88.8. Hemoccult was neg in ED. COVID-19 was noted to be negative.  Subjective:  Patient denies interval complaints.  Patient is a poor historian.  Denies any nausea vomiting or abdominal pain.  She does have poor oral intake.  Assessment/Plan:  Principal Problem:   Acute renal failure (ARF) (HCC) Active Problems:   PVD (peripheral vascular disease) with claudication (HCC)   Hyperlipidemia   Normocytic anemia   Chronic pain syndrome   CKD (chronic kidney disease) stage 3, GFR 30-59 ml/min (HCC)   Hypokalemia  Acute kidney injury on CKD stage III.  Likely secondary to poor oral intake and volume depletion.  Continue on IV fluids.  Improving creatinine from 3.3 to 2.7 today.  Check BMP in a.m. Renal ultrasound was performed which showed echogenic kidney compatible with chronic medical renal disease.  Nutrition evaluation.  Hypokalemia.  Has improved but still low.Continue potassium supplements.  Latest magnesium was 2.0.  Continue to replenish potassium for 3.3 today  normocytic anemia.  Baseline hemoglobin of around 9.   Likely anemia of chronic disease.  Chronic pain syndrome.  Continue Ultram.  Neurontin dose has been decreased in the hospital..  Hyperlipidemia.  Continue statins.  Peripheral vascular disease.  Continue aspirin and Plavix.  Stable at this time.  Generalized weakness.  Likely secondary to volume depletion and acute kidney injury.  Patient has been seen by physical therapy Occupational Therapy who recommended skilled nursing facility placement.  Hypertension.  Continue metoprolol, BiDil.  Monitor blood pressure closely.  Acute metabolic encephalopathy likely secondary to acute kidney injury, volume depletion.  Patient is a poor historian at baseline. Aricept on hold.  Patient is likely at her baseline  Failure to thrive.  Poor oral intake.  I spoke with the patient's son on the phone.  No acute findings on the CT scan of the abdomen except for some colitis which is likely to be nonspecific.  She does not have any diarrhea.  Patient did have endoscopy and colonoscopy recently without any significant findings as per the patient's son.Marland Kitchen  He was unable to tell where it was performed.  Continue nutritional support.TSH from 12/09/2018 was 1.0.  Will repeat TSH again.  Chronic cigarette smoking.  Patient son stated that she smokes just less than a pack a day, on nicotine patch.  This could be contributing to her poor oral intake as well.Marland Kitchen  VTE Prophylaxis: heparin subq  Code Status:  Full  Family Communication: I tried to call the patient's son on the phone on the home and mobile without success.  Disposition Plan: Likely to skilled nursing facility  as per PT OT.  Will consult social services.  Consultants:  None  Procedures:  None  Antibiotics: Anti-infectives (From admission, onward)   None      Objective: Vitals:   03/10/19 0533 03/10/19 0938  BP: 108/62 120/83  Pulse: (!) 59 64  Resp: 16 16  Temp: (!) 97.4 F (36.3 C) 97.8 F (36.6 C)  SpO2: 98% 100%    Intake/Output  Summary (Last 24 hours) at 03/10/2019 1116 Last data filed at 03/10/2019 0910 Gross per 24 hour  Intake 2040.51 ml  Output 0 ml  Net 2040.51 ml   Filed Weights   03/08/19 1416 03/08/19 2002 03/09/19 2002  Weight: 45.4 kg 33.5 kg 39.2 kg   Body mass index is 15.82 kg/m.   Physical Exam: GENERAL: Patient is alert awake and communicative, oriented to self, not in obvious distress. HENT: No scleral pallor or icterus. Pupils equally reactive to light. Oral mucosa is still dry NECK: is supple, no palpable thyroid enlargement. CHEST: Clear to auscultation. No crackles or wheezes. Non tender on palpation. Diminished breath sounds bilaterally. CVS: S1 and S2 heard, no murmur. Regular rate and rhythm. No pericardial rub. ABDOMEN: Soft, non-tender, bowel sounds are present. No palpable hepato-splenomegaly. EXTREMITIES: No edema. CNS: Moving all extremities.  No neck stiffness.  Cranial nerves are intact.  Appears confused SKIN: warm and dry without rashes.  Mildly diminished skin turgor.  Data Review: I have personally reviewed the following laboratory data and studies,  CBC: Recent Labs  Lab 03/08/19 1518 03/09/19 0611 03/10/19 0808  WBC 6.2 9.0 10.6*  NEUTROABS 3.6  --   --   HGB 8.0* 8.5* 8.3*  HCT 25.3* 24.6* 25.4*  MCV 88.8 83.1 87.9  PLT 193 267 150   Basic Metabolic Panel: Recent Labs  Lab 03/08/19 1518 03/08/19 1612 03/09/19 0611 03/10/19 0808  NA 135  --  138 131*  K 2.5*  --  3.4* 3.3*  CL 112*  --  115* 112*  CO2 12*  --  10* 7*  GLUCOSE 89  --  84 103*  BUN 48*  --  46* 44*  CREATININE 3.66*  --  3.33* 2.75*  CALCIUM 8.9  --  8.6* 8.3*  MG  --  2.0  --   --    Liver Function Tests: Recent Labs  Lab 03/08/19 1518 03/09/19 0611  AST 20 16  ALT 9 8  ALKPHOS 69 60  BILITOT 0.6 0.4  PROT 6.7 5.8*  ALBUMIN 3.6 3.0*   Recent Labs  Lab 03/08/19 1518  LIPASE 25   No results for input(s): AMMONIA in the last 168 hours. Cardiac Enzymes: No results for  input(s): CKTOTAL, CKMB, CKMBINDEX, TROPONINI in the last 168 hours. BNP (last 3 results) Recent Labs    12/09/18 1301 12/10/18 0618 12/11/18 0424  BNP 736.6* 744.1* 471.1*    ProBNP (last 3 results) No results for input(s): PROBNP in the last 8760 hours.  CBG: No results for input(s): GLUCAP in the last 168 hours. Recent Results (from the past 240 hour(s))  SARS Coronavirus 2 (CEPHEID - Performed in [place] hospital lab), Hosp Order     Status: None   Collection Time: 03/08/19  2:58 PM  Result Value Ref Range Status   SARS Coronavirus 2 NEGATIVE NEGATIVE Final    Comment: (NOTE) If result is NEGATIVE SARS-CoV-2 target nucleic acids are NOT DETECTED. The SARS-CoV-2 RNA is generally detectable in upper and lower  respiratory specimens during the acute phase of  infection. The lowest  concentration of SARS-CoV-2 viral copies this assay can detect is 250  copies / mL. A negative result does not preclude SARS-CoV-2 infection  and should not be used as the sole basis for treatment or other  patient management decisions.  A negative result may occur with  improper specimen collection / handling, submission of specimen other  than nasopharyngeal swab, presence of viral mutation(s) within the  areas targeted by this assay, and inadequate number of viral copies  (<250 copies / mL). A negative result must be combined with clinical  observations, patient history, and epidemiological information. If result is POSITIVE SARS-CoV-2 target nucleic acids are DETECTED. The SARS-CoV-2 RNA is generally detectable in upper and lower  respiratory specimens dur ing the acute phase of infection.  Positive  results are indicative of active infection with SARS-CoV-2.  Clinical  correlation with patient history and other diagnostic information is  necessary to determine patient infection status.  Positive results do  not rule out bacterial infection or co-infection with other viruses. If result is  PRESUMPTIVE POSTIVE SARS-CoV-2 nucleic acids MAY BE PRESENT.   A presumptive positive result was obtained on the submitted specimen  and confirmed on repeat testing.  While 2019 novel coronavirus  (SARS-CoV-2) nucleic acids may be present in the submitted sample  additional confirmatory testing may be necessary for epidemiological  and / or clinical management purposes  to differentiate between  SARS-CoV-2 and other Sarbecovirus currently known to infect humans.  If clinically indicated additional testing with an alternate test  methodology [phone]) is advised. The SARS-CoV-2 RNA is generally  detectable in upper and lower respiratory sp ecimens during the acute  phase of infection. The expected result is Negative. Fact Sheet for Patients:  StrictlyIdeas.no Fact Sheet for Healthcare Providers: BankingDealers.co.za This test is not yet approved or cleared by the [place] FDA and has been authorized for detection and/or diagnosis of SARS-CoV-2 by FDA under an Emergency Use Authorization (EUA).  This EUA will remain in effect (meaning this test can be used) for the duration of the COVID-19 declaration under Section 564(b)(1) of the Act, 21 U.S.C. section 360bbb-3(b)(1), unless the authorization is terminated or revoked sooner. Performed at [facility], [place] [address], [place], [place] [address]   Culture, blood (routine x 2)     Status: None (Preliminary result)   Collection Time: 03/08/19  3:00 PM  Result Value Ref Range Status   Specimen Description BLOOD RIGHT ANTECUBITAL  Final   Special Requests   Final    BOTTLES DRAWN AEROBIC AND ANAEROBIC Blood Culture adequate volume   Culture   Final    NO GROWTH < 24 HOURS Performed at [facility], [place] [address], [place], [place] [address]    Report Status PENDING  Incomplete  Culture, blood (routine x 2)     Status: None (Preliminary result)   Collection Time: 03/08/19   3:10 PM  Result Value Ref Range Status   Specimen Description BLOOD RIGHT ARM  Final   Special Requests   Final    BOTTLES DRAWN AEROBIC ONLY Blood Culture adequate volume   Culture   Final    NO GROWTH < 24 HOURS Performed at [facility], [place] [address], [place], [place] [address]    Report Status PENDING  Incomplete     Studies: Ct Abdomen Pelvis Wo Contrast  Result Date: 03/09/2019 CLINICAL DATA:  Unintentional weight loss.  Diffuse abdominal pain. EXAM: CT ABDOMEN  AND PELVIS WITHOUT CONTRAST TECHNIQUE: Multidetector CT imaging of the abdomen and pelvis was performed following the standard protocol without IV contrast. COMPARISON:  03/08/2019 renal ultrasound. Stone study of 11/18/2008 reviewed. FINDINGS: Lower chest: Mild bibasilar volume loss. Mild cardiomegaly with right coronary artery atherosclerosis. Trace bilateral pleural fluid or thickening. Moderate hiatal hernia. Hepatobiliary: Degraded exam secondary to lack of IV contrast, paucity of abdominopelvic fat, lumbar spine and right hip hardware artifact. Grossly normal noncontrast appearance of the liver and gallbladder. Common duct is mildly prominent for age, including at 10 mm on image 25/3. Pancreas: Pancreas is not well evaluated. Diffuse pancreatic atrophy, without definite duct dilatation or acute inflammation. Spleen: Normal in size, without focal abnormality. Adrenals/Urinary Tract: Normal adrenal glands. Moderate right renal atrophy. Left renal vascular calcifications. No hydronephrosis. No bladder calculi. Stomach/Bowel: The herniated stomach appears thick walled including on image 8/3. This area is underdistended and redundant. The left side of the colon is underdistended and not contrast opacified. Suspect concurrent moderate wall thickening, including on image 48/3. Diverticular identified throughout the colon. Grossly normal terminal ileum. The appendix may be identified including on image 39/3. Small bowel loops are  grossly unremarkable, but not well evaluated. Vascular/Lymphatic: Advanced aortic and branch vessel atherosclerosis. Status post fem/fem bypass. Limited evaluation for abdominopelvic adenopathy. Reproductive: Calcified uterine fibroids. Adnexa not well evaluated. Other: No gross free pelvic fluid or abdominal ascites. Musculoskeletal: Right hip arthroplasty. L4-5 trans pedicle screw fixation. Advanced lumbosacral spondylosis with mild convex left lumbar spine curvature. IMPRESSION: 1. Moderate to marked degradation, secondary to multiple factors detailed above. 2. Moderate hiatal hernia with apparent wall thickening within the herniated stomach. This could at least partially be due to underdistention. Given the clinical history of weight loss, endoscopy should be considered to exclude gastric neoplasm. 3. Suspicion of left-sided colitis, suboptimally evaluated. Consider infection or ischemia. 4. Uterine fibroids. 5. Coronary artery atherosclerosis. Aortic Atherosclerosis (ICD10-I70.0). 6. Mild common duct dilatation for age. Correlate with bilirubin level. If elevated, consider MRCP or ERCP. Electronically Signed   By: Abigail Miyamoto M.D.   On: 03/09/2019 17:31   US Renal  Result Date: 03/08/2019 CLINICAL DATA:  78 year old female acute renal failure. EXAM: RENAL / URINARY TRACT ULTRASOUND COMPLETE COMPARISON:  Lumbar spine CT 05/17/2016. FINDINGS: Right Kidney: Renal measurements: 6.9 x 2.5 x 3.6 centimeters = volume: 33 mL. Small echogenic right kidney (image 8). No hydronephrosis. Small simple appearing 8 millimeters cyst. Left Kidney: Renal measurements: 10.0 x 4.9 x 4.8 centimeters = volume: 22 mL. Echogenic left kidney (image 18). Mild asymmetric prominence of the left renal collecting system (image 27), but no overt hydronephrosis. Small 8 millimeter simple appearing cyst. Bladder: Appears normal for degree of bladder distention. IMPRESSION: 1. Echogenic kidneys compatible with chronic medical renal  disease. Asymmetric right renal atrophy. 2. Asymmetrically larger left renal collecting system, but no overt hydronephrosis. Electronically Signed   By: Genevie Ann M.D.   On: 03/08/2019 19:45   Dg Chest Port 1 View  Result Date: 03/08/2019 CLINICAL DATA:  Shortness of breath EXAM: PORTABLE CHEST 1 VIEW COMPARISON:  12/27/2018 FINDINGS: Heart is borderline in size. No confluent airspace opacities, effusions or edema. No acute bony abnormality. IMPRESSION: No active disease. Electronically Signed   By: Rolm Baptise M.D.   On: 03/08/2019 17:04    Scheduled Meds:  aspirin EC  81 mg Oral Daily   clopidogrel  75 mg Oral Daily   feeding supplement (ENSURE ENLIVE)  237 mL Oral TID BM   ferrous  sulfate  325 mg Oral Q breakfast   gabapentin  100 mg Oral BID   heparin  5,000 Units Subcutaneous Q8H   isosorbide mononitrate  60 mg Oral Daily   isosorbide-hydrALAZINE  2 tablet Oral TID   metoprolol tartrate  12.5 mg Oral BID   nicotine  21 mg Transdermal Daily   rosuvastatin  40 mg Oral QHS   valproic acid  250 mg Oral BID    Continuous Infusions:  sodium chloride 75 mL/hr at 03/10/19 0943     Flora Lipps, MD  [facility] 03/10/2019

## 2019-03-11 LAB — CBC
HCT: 23 % — ABNORMAL LOW (ref 36.0–46.0)
Hemoglobin: 7.7 g/dL — ABNORMAL LOW (ref 12.0–15.0)
MCH: 28.5 pg (ref 26.0–34.0)
MCHC: 33.5 g/dL (ref 30.0–36.0)
MCV: 85.2 fL (ref 80.0–100.0)
Platelets: 295 10*3/uL (ref 150–400)
RBC: 2.7 MIL/uL — ABNORMAL LOW (ref 3.87–5.11)
RDW: 15.5 % (ref 11.5–15.5)
WBC: 11.1 10*3/uL — ABNORMAL HIGH (ref 4.0–10.5)
nRBC: 0.3 % — ABNORMAL HIGH (ref 0.0–0.2)

## 2019-03-11 LAB — BASIC METABOLIC PANEL
Anion gap: 13 (ref 5–15)
BUN: 43 mg/dL — ABNORMAL HIGH (ref 8–23)
CO2: 10 mmol/L — ABNORMAL LOW (ref 22–32)
Calcium: 8.6 mg/dL — ABNORMAL LOW (ref 8.9–10.3)
Chloride: 113 mmol/L — ABNORMAL HIGH (ref 98–111)
Creatinine, Ser: 2.54 mg/dL — ABNORMAL HIGH (ref 0.44–1.00)
GFR calc Af Amer: 21 mL/min — ABNORMAL LOW (ref >60)
GFR calc non Af Amer: 18 mL/min — ABNORMAL LOW (ref >60)
Glucose, Bld: 86 mg/dL (ref 70–99)
Potassium: 3.1 mmol/L — ABNORMAL LOW (ref 3.5–5.1)
Sodium: 136 mmol/L (ref 135–145)

## 2019-03-11 LAB — MAGNESIUM: Magnesium: 1.9 mg/dL (ref 1.7–2.4)

## 2019-03-11 NOTE — Progress Notes (Addendum)
PROGRESS NOTE  Alyssa [name] HFW:[id number] DOB: Feb 20, 1941 DOA: 03/08/2019 PCP: Everardo Beals, NP   LOS: 3 days   Brief narrative:  Alyssa [name] is a 78 y.o. female with medical history significant of stage 3 CKD, normocytic anemia, HTN, active tobacco abuse who presented to the hospital with complaints of decreased appetite, increased weakness and increasing confusion.. Pt was seen at urgent care where she was prescribed a trial of Aricept. Patient also noted to have intermittent LE swelling. Patient's son reported that the patient had recent fall 3-4 days prior to admit resulting in injury to hip. Pt was seen at urgent care and reportedly had hip xray neg for fracture. In the ED, patient noted to have initial temp of 100F orally and rectally. Potassium was noted to be 2.5 with Cr in excess of 3.66 with BUN of 48. Hgb was noted to be 8.0 with MCV of 88.8. Hemoccult was neg in ED. COVID-19 was noted to be negative.  Subjective:  Patient denies interval complaints.  Patient is a poor historian.  Denies any nausea vomiting or abdominal pain.  She does have poor oral intake.  Assessment/Plan:  Principal Problem:   Acute renal failure (ARF) (HCC) Active Problems:   PVD (peripheral vascular disease) with claudication (HCC)   Hyperlipidemia   Normocytic anemia   Chronic pain syndrome   CKD (chronic kidney disease) stage 3, GFR 30-59 ml/min (HCC)   Hypokalemia  Acute kidney injury on CKD stage III.  Likely secondary to poor oral intake and volume depletion.  Continue on IV fluids.  Improving creatinine from 2.7 to  2.5 today.  Check BMP in a.m. Renal ultrasound was performed which showed echogenic kidney compatible with chronic medical renal disease.  Nutrition evaluation.  Hypokalemia.  Continues to have low potassium.  We will continue to replenish.  normocytic anemia.  Baseline hemoglobin of around 9.  Likely anemia of chronic disease.  Chronic pain syndrome.   Continue Ultram.  Neurontin dose has been decreased in the hospital..  Hyperlipidemia.  Continue statins.  Peripheral vascular disease.  Continue aspirin and Plavix.  Stable at this time.  Generalized weakness.  Likely secondary to volume depletion and acute kidney injury.  Patient has been seen by physical therapy Occupational Therapy who recommended skilled nursing facility placement.  Social worker was consulted for possible skilled nursing facility placement at this time discussion is home with home health as per the family wishes.  Fall today.  No local injury.  We will close monitor.  Hypertension.  Continue metoprolol, BiDil.  Monitor blood pressure closely.  Acute metabolic encephalopathy likely secondary to acute kidney injury, volume depletion.  Patient is a poor historian at baseline. Aricept on hold.  Patient is likely at her baseline  Failure to thrive.  Poor oral intake.  I spoke with the patient's son on the phone.  No acute findings on the CT scan of the abdomen except for some colitis which is likely to be nonspecific.  She does not have any diarrhea.  Patient did have endoscopy and colonoscopy recently without any significant findings as per the patient's son.  Continue nutritional support.TSH from 12/09/2018 was 1.0.    Chronic cigarette smoking.  Patient son stated that she smokes just less than a pack a day, on nicotine patch.  This could be contributing to her poor oral intake as well.Marland Kitchen  VTE Prophylaxis: heparin subq  Code Status:  Full  Family Communication:  I spoke with the patient's son on the  phone and updated him about the clinical condition of the patient.  We discussed about disposition.  I informed him that the patient had a fall event today but he still feels like patient would be okay to come home and he would be there to help including his stepdad.  Patient does have a walker and a cane at home and at baseline does not do much at home.  Disposition Plan:  Likely home with home health tomorrow if the patient remains stable.  Consultants:  None  Procedures:  None  Antibiotics: Anti-infectives (From admission, onward)   None      Objective: Vitals:   03/11/19 1125 03/11/19 1126  BP: 107/68 107/68  Pulse: 78 77  Resp: 14 14  Temp: (!) 97.4 F (36.3 C) (!) 97.4 F (36.3 C)  SpO2: 99% 99%    Intake/Output Summary (Last 24 hours) at 03/11/2019 1233 Last data filed at 03/11/2019 0542 Gross per 24 hour  Intake 1324.51 ml  Output -  Net 1324.51 ml   Filed Weights   03/08/19 2002 03/09/19 2002 03/10/19 2056  Weight: 33.5 kg 39.2 kg 40.3 kg   Body mass index is 16.25 kg/m.   Physical Exam: GENERAL: Patient is alert awake and communicative, oriented to self, not in obvious distress. HENT: No scleral pallor or icterus. Pupils equally reactive to light. Oral mucosa is moist NECK: is supple, no palpable thyroid enlargement. CHEST: Clear to auscultation. No crackles or wheezes. Non tender on palpation. Diminished breath sounds bilaterally. CVS: S1 and S2 heard, no murmur. Regular rate and rhythm. No pericardial rub. ABDOMEN: Soft, non-tender, bowel sounds are present. No palpable hepato-splenomegaly. EXTREMITIES: No edema. CNS: Moving all extremities.   Cranial nerves are intact.  SKIN: warm and dry without rashes.  Mildly diminished skin turgor.  Data Review: I have personally reviewed the following laboratory data and studies,  CBC: Recent Labs  Lab 03/08/19 1518 03/09/19 0611 03/10/19 0808 03/11/19 0851  WBC 6.2 9.0 10.6* 11.1*  NEUTROABS 3.6  --   --   --   HGB 8.0* 8.5* 8.3* 7.7*  HCT 25.3* 24.6* 25.4* 23.0*  MCV 88.8 83.1 87.9 85.2  PLT 193 267 309 235   Basic Metabolic Panel: Recent Labs  Lab 03/08/19 1518 03/08/19 1612 03/09/19 0611 03/10/19 0808 03/11/19 0552  NA 135  --  138 131* 136  K 2.5*  --  3.4* 3.3* 3.1*  CL 112*  --  115* 112* 113*  CO2 12*  --  10* 7* 10*  GLUCOSE 89  --  84 103* 86   BUN 48*  --  46* 44* 43*  CREATININE 3.66*  --  3.33* 2.75* 2.54*  CALCIUM 8.9  --  8.6* 8.3* 8.6*  MG  --  2.0  --   --  1.9   Liver Function Tests: Recent Labs  Lab 03/08/19 1518 03/09/19 0611  AST 20 16  ALT 9 8  ALKPHOS 69 60  BILITOT 0.6 0.4  PROT 6.7 5.8*  ALBUMIN 3.6 3.0*   Recent Labs  Lab 03/08/19 1518  LIPASE 25   No results for input(s): AMMONIA in the last 168 hours. Cardiac Enzymes: No results for input(s): CKTOTAL, CKMB, CKMBINDEX, TROPONINI in the last 168 hours. BNP (last 3 results) Recent Labs    12/09/18 1301 12/10/18 0618 12/11/18 0424  BNP 736.6* 744.1* 471.1*    ProBNP (last 3 results) No results for input(s): PROBNP in the last 8760 hours.  CBG: No results for input(s):  GLUCAP in the last 168 hours. Recent Results (from the past 240 hour(s))  SARS Coronavirus 2 (CEPHEID - Performed in [place] hospital lab), Hosp Order     Status: None   Collection Time: 03/08/19  2:58 PM  Result Value Ref Range Status   SARS Coronavirus 2 NEGATIVE NEGATIVE Final    Comment: (NOTE) If result is NEGATIVE SARS-CoV-2 target nucleic acids are NOT DETECTED. The SARS-CoV-2 RNA is generally detectable in upper and lower  respiratory specimens during the acute phase of infection. The lowest  concentration of SARS-CoV-2 viral copies this assay can detect is 250  copies / mL. A negative result does not preclude SARS-CoV-2 infection  and should not be used as the sole basis for treatment or other  patient management decisions.  A negative result may occur with  improper specimen collection / handling, submission of specimen other  than nasopharyngeal swab, presence of viral mutation(s) within the  areas targeted by this assay, and inadequate number of viral copies  (<250 copies / mL). A negative result must be combined with clinical  observations, patient history, and epidemiological information. If result is POSITIVE SARS-CoV-2 target nucleic acids are  DETECTED. The SARS-CoV-2 RNA is generally detectable in upper and lower  respiratory specimens dur ing the acute phase of infection.  Positive  results are indicative of active infection with SARS-CoV-2.  Clinical  correlation with patient history and other diagnostic information is  necessary to determine patient infection status.  Positive results do  not rule out bacterial infection or co-infection with other viruses. If result is PRESUMPTIVE POSTIVE SARS-CoV-2 nucleic acids MAY BE PRESENT.   A presumptive positive result was obtained on the submitted specimen  and confirmed on repeat testing.  While 2019 novel coronavirus  (SARS-CoV-2) nucleic acids may be present in the submitted sample  additional confirmatory testing may be necessary for epidemiological  and / or clinical management purposes  to differentiate between  SARS-CoV-2 and other Sarbecovirus currently known to infect humans.  If clinically indicated additional testing with an alternate test  methodology [phone]) is advised. The SARS-CoV-2 RNA is generally  detectable in upper and lower respiratory sp ecimens during the acute  phase of infection. The expected result is Negative. Fact Sheet for Patients:  StrictlyIdeas.no Fact Sheet for Healthcare Providers: BankingDealers.co.za This test is not yet approved or cleared by the [place] FDA and has been authorized for detection and/or diagnosis of SARS-CoV-2 by FDA under an Emergency Use Authorization (EUA).  This EUA will remain in effect (meaning this test can be used) for the duration of the COVID-19 declaration under Section 564(b)(1) of the Act, 21 U.S.C. section 360bbb-3(b)(1), unless the authorization is terminated or revoked sooner. Performed at [facility], [place] [address] [place], [place] [address]   Culture, blood (routine x 2)     Status: None (Preliminary result)   Collection Time: 03/08/19  3:00  PM  Result Value Ref Range Status   Specimen Description BLOOD RIGHT ANTECUBITAL  Final   Special Requests   Final    BOTTLES DRAWN AEROBIC AND ANAEROBIC Blood Culture adequate volume   Culture   Final    NO GROWTH 3 DAYS Performed at [facility], [place] [address] [place], [place] [address]    Report Status PENDING  Incomplete  Culture, blood (routine x 2)     Status: None (Preliminary result)   Collection Time: 03/08/19  3:10 PM  Result Value Ref Range Status  Specimen Description BLOOD RIGHT ARM  Final   Special Requests   Final    BOTTLES DRAWN AEROBIC ONLY Blood Culture adequate volume   Culture   Final    NO GROWTH 3 DAYS Performed at [facility], [address] [place], [place] [address]    Report Status PENDING  Incomplete     Studies: Ct Abdomen Pelvis Wo Contrast  Result Date: 03/09/2019 CLINICAL DATA:  Unintentional weight loss.  Diffuse abdominal pain. EXAM: CT ABDOMEN AND PELVIS WITHOUT CONTRAST TECHNIQUE: Multidetector CT imaging of the abdomen and pelvis was performed following the standard protocol without IV contrast. COMPARISON:  03/08/2019 renal ultrasound. Stone study of 11/18/2008 reviewed. FINDINGS: Lower chest: Mild bibasilar volume loss. Mild cardiomegaly with right coronary artery atherosclerosis. Trace bilateral pleural fluid or thickening. Moderate hiatal hernia. Hepatobiliary: Degraded exam secondary to lack of IV contrast, paucity of abdominopelvic fat, lumbar spine and right hip hardware artifact. Grossly normal noncontrast appearance of the liver and gallbladder. Common duct is mildly prominent for age, including at 10 mm on image 25/3. Pancreas: Pancreas is not well evaluated. Diffuse pancreatic atrophy, without definite duct dilatation or acute inflammation. Spleen: Normal in size, without focal abnormality. Adrenals/Urinary Tract: Normal adrenal glands. Moderate right renal atrophy. Left renal vascular calcifications. No hydronephrosis. No  bladder calculi. Stomach/Bowel: The herniated stomach appears thick walled including on image 8/3. This area is underdistended and redundant. The left side of the colon is underdistended and not contrast opacified. Suspect concurrent moderate wall thickening, including on image 48/3. Diverticular identified throughout the colon. Grossly normal terminal ileum. The appendix may be identified including on image 39/3. Small bowel loops are grossly unremarkable, but not well evaluated. Vascular/Lymphatic: Advanced aortic and branch vessel atherosclerosis. Status post fem/fem bypass. Limited evaluation for abdominopelvic adenopathy. Reproductive: Calcified uterine fibroids. Adnexa not well evaluated. Other: No gross free pelvic fluid or abdominal ascites. Musculoskeletal: Right hip arthroplasty. L4-5 trans pedicle screw fixation. Advanced lumbosacral spondylosis with mild convex left lumbar spine curvature. IMPRESSION: 1. Moderate to marked degradation, secondary to multiple factors detailed above. 2. Moderate hiatal hernia with apparent wall thickening within the herniated stomach. This could at least partially be due to underdistention. Given the clinical history of weight loss, endoscopy should be considered to exclude gastric neoplasm. 3. Suspicion of left-sided colitis, suboptimally evaluated. Consider infection or ischemia. 4. Uterine fibroids. 5. Coronary artery atherosclerosis. Aortic Atherosclerosis (ICD10-I70.0). 6. Mild common duct dilatation for age. Correlate with bilirubin level. If elevated, consider MRCP or ERCP. Electronically Signed   By: Abigail Miyamoto M.D.   On: 03/09/2019 17:31    Scheduled Meds: . aspirin EC  81 mg Oral Daily  . clopidogrel  75 mg Oral Daily  . feeding supplement (ENSURE ENLIVE)  237 mL Oral TID BM  . ferrous sulfate  325 mg Oral Q breakfast  . gabapentin  100 mg Oral BID  . heparin  5,000 Units Subcutaneous Q8H  . isosorbide mononitrate  60 mg Oral Daily  .  isosorbide-hydrALAZINE  2 tablet Oral TID  . magnesium oxide  400 mg Oral BID  . metoprolol tartrate  12.5 mg Oral BID  . nicotine  21 mg Transdermal Daily  . rosuvastatin  40 mg Oral QHS  . valproic acid  250 mg Oral BID    Continuous Infusions: . sodium chloride 75 mL/hr at 03/10/19 9518     Flora Lipps, MD  [facility] 03/11/2019

## 2019-03-11 NOTE — Progress Notes (Signed)
This RN was coming from another patients room when I heard the patient call out and stated, "Hey, help." When I entered the room, I found the patient sitting on the floor in no distress. Patient stated that she was trying to get up to use the bathroom. Patient stated that she hit her head on what seemed to be the side of the bed. Patient assessed. No visible injuries. Denies pain to head, but states that bilateral legs are hurting her. Pain assessment completed. Patient assisted back to bed. Vital signs obtained and appropriate. Patient cleaned up. Pokhrel, MD notified. Awaiting orders. Patients neuro assessment completed with no changes to previous assessment. Patient now sitting up in bed eating lunch. Bed alarm activated on the lowest sensitivity, new yellow socks placed, yellow fall risk bracelet applied to patient, call bell within reach and patient educated on the importance of calling before getting out of bed. Door to patients room is open and low bed has been ordered for patient. Will continue to monitor.

## 2019-03-12 LAB — CBC
HCT: 24.1 % — ABNORMAL LOW (ref 36.0–46.0)
Hemoglobin: 7.8 g/dL — ABNORMAL LOW (ref 12.0–15.0)
MCH: 28.3 pg (ref 26.0–34.0)
MCHC: 32.4 g/dL (ref 30.0–36.0)
MCV: 87.3 fL (ref 80.0–100.0)
Platelets: 321 10*3/uL (ref 150–400)
RBC: 2.76 MIL/uL — ABNORMAL LOW (ref 3.87–5.11)
RDW: 15.9 % — ABNORMAL HIGH (ref 11.5–15.5)
WBC: 11.9 10*3/uL — ABNORMAL HIGH (ref 4.0–10.5)
nRBC: 0.4 % — ABNORMAL HIGH (ref 0.0–0.2)

## 2019-03-12 LAB — BASIC METABOLIC PANEL
Anion gap: 10 (ref 5–15)
BUN: 44 mg/dL — ABNORMAL HIGH (ref 8–23)
CO2: 12 mmol/L — ABNORMAL LOW (ref 22–32)
Calcium: 8.5 mg/dL — ABNORMAL LOW (ref 8.9–10.3)
Chloride: 113 mmol/L — ABNORMAL HIGH (ref 98–111)
Creatinine, Ser: 2.47 mg/dL — ABNORMAL HIGH (ref 0.44–1.00)
GFR calc Af Amer: 21 mL/min — ABNORMAL LOW (ref >60)
GFR calc non Af Amer: 18 mL/min — ABNORMAL LOW (ref >60)
Glucose, Bld: 117 mg/dL — ABNORMAL HIGH (ref 70–99)
Potassium: 3.3 mmol/L — ABNORMAL LOW (ref 3.5–5.1)
Sodium: 135 mmol/L (ref 135–145)

## 2019-03-12 LAB — TSH: TSH: 1.073 u[IU]/mL (ref 0.350–4.500)

## 2019-03-12 MED ORDER — POTASSIUM CHLORIDE ER 10 MEQ PO TBCR: 10.0000 meq | EXTENDED_RELEASE_TABLET | Freq: Every day | ORAL | 0 refills | Status: DC

## 2019-03-12 MED ORDER — METOPROLOL TARTRATE 25 MG PO TABS: 12.5000 mg | ORAL_TABLET | Freq: Two times a day (BID) | ORAL | Status: DC

## 2019-03-12 MED ORDER — MAGNESIUM OXIDE 400 (241.3 MG) MG PO TABS: 400.0000 mg | ORAL_TABLET | Freq: Two times a day (BID) | ORAL | 0 refills | Status: DC

## 2019-03-12 MED ORDER — NICOTINE 21 MG/24HR TD PT24: 21.0000 mg | MEDICATED_PATCH | Freq: Every day | TRANSDERMAL | 0 refills | Status: DC

## 2019-03-12 MED ORDER — GABAPENTIN 100 MG PO CAPS: 100.0000 mg | ORAL_CAPSULE | Freq: Three times a day (TID) | ORAL | 2 refills | Status: DC

## 2019-03-12 NOTE — TOC Transition Note (Addendum)
Transition of Care [facility]) - CM/SW Discharge Note   Patient Details  Name: Alyssa [name] MRN: [id number] Date of Birth: [birth year]  [facility]) CM/SW Contact:  Bartholomew Crews, RN Phone Number: [phone] 03/12/2019, 12:00 PM   Clinical Narrative:    Notified by bedside RN that patient had discharge orders. Telephone call to son - Alyssa [name] - who stated that he was aware of the discharge - advised that his brother would be picking patient up this afternoon. Discussed HH needs - choice offered - referral made to [facility] for RN, PT, OT, Aide, and CSW -  Update: Accepted. No other transition of care needs identified at this time.    Final next level of care: [place] Barriers to Discharge: No Barriers Identified   Patient Goals and CMS Choice Patient states their goals for this hospitalization and ongoing recovery are:: son - Alyssa [name] - states that he can provide 24 hour supervision CMS Medicare.gov Compare Post Acute Care list provided to:: Patient Represenative (must comment)(son - Alyssa [name]) Choice offered to / list presented to : Adult Children  Discharge Placement                       Discharge Plan and Services In-house Referral: Clinical Social Work   Post Acute Care Choice: Home Health          DME Arranged: N/A DME Agency: NA       HH Arranged: RN, OT, Nurse's Aide, PT, Social Work [employer] Agency: [place] ([place]) Date [place]: 03/12/19 Time [place]: 1159 Representative spoke with at [place]: Agency ([place]) Interventions     Readmission Risk Interventions Readmission Risk Prevention Plan 12/26/2018  Transportation Screening Complete  Medication Review Press photographer) Complete  PCP or Specialist appointment within 3-5 days of discharge Not Complete  PCP/Specialist Appt Not Complete comments pt for snf. Snf MD to see as needed.  [place] or Home Care Consult Not Complete   HRI or Home Care Consult Pt Refusal Comments NA  SW Recovery Care/Counseling Consult Not Complete  SW Consult Not Complete Comments NA  Palliative Care Screening Complete  Skilled Nursing Facility Complete  Some recent data might be hidden

## 2019-03-12 NOTE — Care Management Important Message (Signed)
Important Message  Patient Details  Name: Alyssa [name] MRN: [id number] Date of Birth: [birth year]   Medicare Important Message Given:  Yes    Orbie Pyo 03/12/2019, 2:24 PM

## 2019-03-12 NOTE — Progress Notes (Signed)
Physical Therapy Treatment Patient Details Name: Alyssa [name] MRN: [id number] DOB: 07/12/1941 Today's Date: 03/12/2019    History of Present Illness Pt is a 78 y/o female admitted secondary to worsening weakness. Pt found to have an acute on chronic kidney injury. Of note, pt recently admitted secondary to PNA and fall at home (no injuries). PMH including but not limited to CKD, HTN and PVD.    PT Comments    Patient seen for mobility progression, limited by incontinence of stool. Remains weak and deconditioned. Current POC remains appropriate.   Follow Up Recommendations  SNF     Equipment Recommendations  None recommended by PT    Recommendations for Other Services       Precautions / Restrictions Precautions Precautions: Fall Precaution Comments: incontinent of bowel Restrictions Weight Bearing Restrictions: No    Mobility  Bed Mobility Overal bed mobility: Needs Assistance Bed Mobility: Supine to Sit;Sit to Supine     Supine to sit: Min assist Sit to supine: Min assist   General bed mobility comments: use of bed pads to position pt's hips at EOB, HOB elevated  Transfers Overall transfer level: Needs assistance Equipment used: Rolling walker (2 wheeled) Transfers: Sit to/from Stand Sit to Stand: Min assist         General transfer comment: Min assist to elevate to standing from bed and toilert  Ambulation/Gait Ambulation/Gait assistance: Min assist Gait Distance (Feet): 24 Feet Assistive device: Rolling walker (2 wheeled) Gait Pattern/deviations: Step-through pattern;Decreased stride length;Shuffle;Trunk flexed Gait velocity: decreased   General Gait Details: Assist for stability and environmental navigation in room with RW   Stairs             Wheelchair Mobility    Modified Rankin (Stroke Patients Only)       Balance Overall balance assessment: Needs assistance Sitting-balance support: Feet supported Sitting balance-Leahy  Scale: Fair     Standing balance support: Bilateral upper extremity supported;Single extremity supported Standing balance-Leahy Scale: Poor Standing balance comment: reliance on UE support and assist                            Cognition Arousal/Alertness: Awake/alert Behavior During Therapy: Flat affect Overall Cognitive Status: Impaired/Different from baseline Area of Impairment: Memory;Following commands;Safety/judgement;Problem solving                     Memory: Decreased short-term memory Following Commands: Follows one step commands with increased time Safety/Judgement: Decreased awareness of deficits;Decreased awareness of safety   Problem Solving: Slow processing;Decreased initiation;Difficulty sequencing;Requires verbal cues;Requires tactile cues        Exercises      General Comments        Pertinent Vitals/Pain Pain Assessment: No/denies pain    Home Living                      Prior Function            PT Goals (current goals can now be found in the care plan section) Acute Rehab PT Goals Patient Stated Goal: to sleep PT Goal Formulation: With patient Time For Goal Achievement: 03/23/19 Potential to Achieve Goals: Good Progress towards PT goals: Progressing toward goals    Frequency    Min 2X/week      PT Plan Current plan remains appropriate    Co-evaluation              AM-PAC PT "6  Clicks" Mobility   Outcome Measure  Help needed turning from your back to your side while in a flat bed without using bedrails?: A Little Help needed moving from lying on your back to sitting on the side of a flat bed without using bedrails?: A Little Help needed moving to and from a bed to a chair (including a wheelchair)?: A Little Help needed standing up from a chair using your arms (e.g., wheelchair or bedside chair)?: A Little Help needed to walk in hospital room?: A Little Help needed climbing 3-5 steps with a railing?  : A Lot 6 Click Score: 17    End of Session Equipment Utilized During Treatment: Gait belt Activity Tolerance: Patient tolerated treatment well Patient left: in bed;with call bell/phone within reach(bed alarm unable to engage) Nurse Communication: Mobility status PT Visit Diagnosis: Muscle weakness (generalized) (M62.81)     Time: 0300-9233 PT Time Calculation (min) (ACUTE ONLY): 20 min  Charges:  $Therapeutic Activity: 8-22 mins                     Alben Deeds, PT DPT  Board Certified Neurologic Specialist Acute Rehabilitation Services Pager [phone] Office [phone]    Duncan Dull 03/12/2019, 11:47 AM

## 2019-03-12 NOTE — Discharge Summary (Signed)
Physician Discharge Summary  Alyssa [name] OEV:[id number] DOB: 1941-06-11 DOA: 03/08/2019  PCP: Everardo Beals, NP  Admit date: 03/08/2019 Discharge date: 03/12/2019  Admitted From: Home  Discharge disposition: Home with home PT   Recommendations for Outpatient Follow-Up:    Follow up with your primary care provider in one week.  Check CBC BMP at that time   Discharge Diagnosis:   Principal Problem:   Acute renal failure (ARF) (HCC) Active Problems:   PVD (peripheral vascular disease) with claudication (HCC)   Hyperlipidemia   Normocytic anemia   Chronic pain syndrome   CKD (chronic kidney disease) stage 3, GFR 30-59 ml/min (HCC)   Hypokalemia   Discharge Condition: Improved.  Diet recommendation: Low sodium, heart healthy.    Wound care: None.  Code status: Full.   History of Present Illness:   Alyssa [name] a 78 y.o.femalewith medical history significant ofstage 3 CKD, normocytic anemia, HTN, active tobacco abuse who presented to the hospital with complaints of decreased appetite, increased weakness and increasing confusion.. Pt was seen at urgent care where she was prescribed a trial of Aricept. Patient also noted to have intermittent Lower extermity swelling. Patient's son reported that the patient had recent fall 3-4 days prior to admit resulting in injury to hip. Pt was seen at urgent care and reportedly had hip xray neg for fracture.In the ED, patient noted to have initial temp of 100F orally and rectally. Potassium was noted to be 2.5 with Cr in excess of 3.66 with BUN of 48. Hgb was noted to be 8.0 with MCV of 88.8. Hemoccult was neg in ED. COVID-19 was noted to be negative.   Hospital Course:  Patient was admitted to the hospital and following conditions were addressed during hospitalization,  Acute kidney injury on CKD stage III.  Likely secondary to poor oral intake and volume depletion.  Patient received IV fluids with improvement  in her renal function.   Renal ultrasound was performed which showed echogenic kidney compatible with chronic medical renal disease.  Nutrition evaluation.  Will need BMP as outpatient for follow-up.  Hypokalemia.  Continues to have low potassium.    Will provide potassium supplements on discharge.  This will need to be monitored by primary care physician as outpatient.  normocytic anemia.  Likely anemia of chronic disease.  No evidence of bleeding noted during hospitalization.  Patient received IV fluids during hospitalization resulting in dilutional anemia.  Chronic pain syndrome.    On Ultram and Neurontin as outpatient.  Hyperlipidemia.  Continue statins.  Peripheral vascular disease.  Continue aspirin and Plavix.    Generalized weakness.  Likely secondary to volume depletion and acute kidney injury.  Patient has been seen by physical therapy Occupational Therapy who recommended skilled nursing facility placement.    Patient's family wished home with home health services.  Home health PT OT aide and social workers will be arranged on discharge.  Essential hypertension.  Continue home medications on discharge  Acute metabolic encephalopathy likely secondary to acute kidney injury, volume depletion.  Patient is a poor historian at baseline. Aricept was kept on hold.  Patient was finally at her baseline prior to discharge.  Failure to thrive.  Poor oral intake.    Likely secondary to declining cognitive function. CT scan of the abdomen and pelvis without acute findings. Patient did have endoscopy and colonoscopy recently without any significant findings as per the patient's son.  Continue nutritional support.TSH from 12/09/2018 was 1.0.    Chronic cigarette  smoking.  Patient son stated that she smokes just less than a pack a day, on nicotine patch.  This could be contributing to her poor oral intake as well.. Nicotine patch will be prescribed on discharge  Disposition.  At this time,  patient is a stable for disposition home.  Patient will be discharged home with home health services.  Spoke with the patient's son on the phone and updated him about the clinical condition of the patient.    Medical Consultants:    None.   Subjective:   Today, patient feels okay.  Still has impaired appetite.  No fever, chills nausea vomiting reported.  Discharge Exam:   Vitals:   03/11/19 2037 03/12/19 0926  BP: 99/82 122/82  Pulse: 76 65  Resp: 18 18  Temp: 97.6 F (36.4 C)   SpO2: 99% 100%   Vitals:   03/11/19 1126 03/11/19 1748 03/11/19 2037 03/12/19 0926  BP: 107/68 (!) 106/58 99/82 122/82  Pulse: 77 73 76 65  Resp: 14 16 18 18   Temp: (!) 97.4 F (36.3 C) 97.9 F (36.6 C) 97.6 F (36.4 C)   TempSrc: Oral Oral Oral   SpO2: 99% 100% 99% 100%  Weight:   44.2 kg   Height:       General exam: Appears calm and comfortable ,Not in distress HEENT:PERRL,Oral mucosa moist Respiratory system: Bilateral equal air entry, normal vesicular breath sounds, no wheezes or crackles  Cardiovascular system: S1 & S2 heard, RRR.  Gastrointestinal system: Abdomen is nondistended, soft and nontender. No organomegaly or masses felt. Normal bowel sounds heard. Central nervous system: Alert and oriented to self.. No focal neurological deficits. Extremities: No edema, no clubbing ,no cyanosis, distal peripheral pulses palpable. Skin: No rashes, lesions or ulcers,no icterus ,no pallor MSK: Normal muscle bulk,tone ,power    Procedures:    None  The results of significant diagnostics from this hospitalization (including imaging, microbiology, ancillary and laboratory) are listed below for reference.     Diagnostic Studies:   Ct Abdomen Pelvis Wo Contrast  Result Date: 03/09/2019 CLINICAL DATA:  Unintentional weight loss.  Diffuse abdominal pain. EXAM: CT ABDOMEN AND PELVIS WITHOUT CONTRAST TECHNIQUE: Multidetector CT imaging of the abdomen and pelvis was performed following the  standard protocol without IV contrast. COMPARISON:  03/08/2019 renal ultrasound. Stone study of 11/18/2008 reviewed. FINDINGS: Lower chest: Mild bibasilar volume loss. Mild cardiomegaly with right coronary artery atherosclerosis. Trace bilateral pleural fluid or thickening. Moderate hiatal hernia. Hepatobiliary: Degraded exam secondary to lack of IV contrast, paucity of abdominopelvic fat, lumbar spine and right hip hardware artifact. Grossly normal noncontrast appearance of the liver and gallbladder. Common duct is mildly prominent for age, including at 10 mm on image 25/3. Pancreas: Pancreas is not well evaluated. Diffuse pancreatic atrophy, without definite duct dilatation or acute inflammation. Spleen: Normal in size, without focal abnormality. Adrenals/Urinary Tract: Normal adrenal glands. Moderate right renal atrophy. Left renal vascular calcifications. No hydronephrosis. No bladder calculi. Stomach/Bowel: The herniated stomach appears thick walled including on image 8/3. This area is underdistended and redundant. The left side of the colon is underdistended and not contrast opacified. Suspect concurrent moderate wall thickening, including on image 48/3. Diverticular identified throughout the colon. Grossly normal terminal ileum. The appendix may be identified including on image 39/3. Small bowel loops are grossly unremarkable, but not well evaluated. Vascular/Lymphatic: Advanced aortic and branch vessel atherosclerosis. Status post fem/fem bypass. Limited evaluation for abdominopelvic adenopathy. Reproductive: Calcified uterine fibroids. Adnexa not well evaluated. Other: No gross  free pelvic fluid or abdominal ascites. Musculoskeletal: Right hip arthroplasty. L4-5 trans pedicle screw fixation. Advanced lumbosacral spondylosis with mild convex left lumbar spine curvature. IMPRESSION: 1. Moderate to marked degradation, secondary to multiple factors detailed above. 2. Moderate hiatal hernia with apparent wall  thickening within the herniated stomach. This could at least partially be due to underdistention. Given the clinical history of weight loss, endoscopy should be considered to exclude gastric neoplasm. 3. Suspicion of left-sided colitis, suboptimally evaluated. Consider infection or ischemia. 4. Uterine fibroids. 5. Coronary artery atherosclerosis. Aortic Atherosclerosis (ICD10-I70.0). 6. Mild common duct dilatation for age. Correlate with bilirubin level. If elevated, consider MRCP or ERCP. Electronically Signed   By: Abigail Miyamoto M.D.   On: 03/09/2019 17:31   US Renal  Result Date: 03/08/2019 CLINICAL DATA:  78 year old female acute renal failure. EXAM: RENAL / URINARY TRACT ULTRASOUND COMPLETE COMPARISON:  Lumbar spine CT 05/17/2016. FINDINGS: Right Kidney: Renal measurements: 6.9 x 2.5 x 3.6 centimeters = volume: 33 mL. Small echogenic right kidney (image 8). No hydronephrosis. Small simple appearing 8 millimeters cyst. Left Kidney: Renal measurements: 10.0 x 4.9 x 4.8 centimeters = volume: 22 mL. Echogenic left kidney (image 18). Mild asymmetric prominence of the left renal collecting system (image 27), but no overt hydronephrosis. Small 8 millimeter simple appearing cyst. Bladder: Appears normal for degree of bladder distention. IMPRESSION: 1. Echogenic kidneys compatible with chronic medical renal disease. Asymmetric right renal atrophy. 2. Asymmetrically larger left renal collecting system, but no overt hydronephrosis. Electronically Signed   By: Genevie Ann M.D.   On: 03/08/2019 19:45   Dg Chest Port 1 View  Result Date: 03/08/2019 CLINICAL DATA:  Shortness of breath EXAM: PORTABLE CHEST 1 VIEW COMPARISON:  12/27/2018 FINDINGS: Heart is borderline in size. No confluent airspace opacities, effusions or edema. No acute bony abnormality. IMPRESSION: No active disease. Electronically Signed   By: Rolm Baptise M.D.   On: 03/08/2019 17:04     Labs:   Basic Metabolic Panel: Recent Labs  Lab  03/08/19 1518 03/08/19 1612 03/09/19 0611 03/10/19 0808 03/11/19 0552 03/11/19 1836  NA 135  --  138 131* 136 135  K 2.5*  --  3.4* 3.3* 3.1* 3.3*  CL 112*  --  115* 112* 113* 113*  CO2 12*  --  10* 7* 10* 12*  GLUCOSE 89  --  84 103* 86 117*  BUN 48*  --  46* 44* 43* 44*  CREATININE 3.66*  --  3.33* 2.75* 2.54* 2.47*  CALCIUM 8.9  --  8.6* 8.3* 8.6* 8.5*  MG  --  2.0  --   --  1.9  --    GFR Estimated Creatinine Clearance: 13.7 mL/min (A) (by C-G formula based on SCr of 2.47 mg/dL (H)). Liver Function Tests: Recent Labs  Lab 03/08/19 1518 03/09/19 0611  AST 20 16  ALT 9 8  ALKPHOS 69 60  BILITOT 0.6 0.4  PROT 6.7 5.8*  ALBUMIN 3.6 3.0*   Recent Labs  Lab 03/08/19 1518  LIPASE 25   No results for input(s): AMMONIA in the last 168 hours. Coagulation profile Recent Labs  Lab 03/08/19 1518  INR 2.0*    CBC: Recent Labs  Lab 03/08/19 1518 03/09/19 0611 03/10/19 0808 03/11/19 0851 03/11/19 1836  WBC 6.2 9.0 10.6* 11.1* 11.9*  NEUTROABS 3.6  --   --   --   --   HGB 8.0* 8.5* 8.3* 7.7* 7.8*  HCT 25.3* 24.6* 25.4* 23.0* 24.1*  MCV 88.8 83.1  87.9 85.2 87.3  PLT 193 267 309 295 321   Cardiac Enzymes: No results for input(s): CKTOTAL, CKMB, CKMBINDEX, TROPONINI in the last 168 hours. BNP: Invalid input(s): POCBNP CBG: No results for input(s): GLUCAP in the last 168 hours. D-Dimer No results for input(s): DDIMER in the last 72 hours. Hgb A1c No results for input(s): HGBA1C in the last 72 hours. Lipid Profile No results for input(s): CHOL, HDL, LDLCALC, TRIG, CHOLHDL, LDLDIRECT in the last 72 hours. Thyroid function studies Recent Labs    03/11/19 1836  TSH 1.073   Anemia work up No results for input(s): VITAMINB12, FOLATE, FERRITIN, TIBC, IRON, RETICCTPCT in the last 72 hours. Microbiology Recent Results (from the past 240 hour(s))  SARS Coronavirus 2 (CEPHEID - Performed in [facility] hospital lab), Hosp Order     Status: None   Collection Time:  03/08/19  2:58 PM  Result Value Ref Range Status   SARS Coronavirus 2 NEGATIVE NEGATIVE Final    Comment: (NOTE) If result is NEGATIVE SARS-CoV-2 target nucleic acids are NOT DETECTED. The SARS-CoV-2 RNA is generally detectable in upper and lower  respiratory specimens during the acute phase of infection. The lowest  concentration of SARS-CoV-2 viral copies this assay can detect is 250  copies / mL. A negative result does not preclude SARS-CoV-2 infection  and should not be used as the sole basis for treatment or other  patient management decisions.  A negative result may occur with  improper specimen collection / handling, submission of specimen other  than nasopharyngeal swab, presence of viral mutation(s) within the  areas targeted by this assay, and inadequate number of viral copies  (<250 copies / mL). A negative result must be combined with clinical  observations, patient history, and epidemiological information. If result is POSITIVE SARS-CoV-2 target nucleic acids are DETECTED. The SARS-CoV-2 RNA is generally detectable in upper and lower  respiratory specimens dur ing the acute phase of infection.  Positive  results are indicative of active infection with SARS-CoV-2.  Clinical  correlation with patient history and other diagnostic information is  necessary to determine patient infection status.  Positive results do  not rule out bacterial infection or co-infection with other viruses. If result is PRESUMPTIVE POSTIVE SARS-CoV-2 nucleic acids MAY BE PRESENT.   A presumptive positive result was obtained on the submitted specimen  and confirmed on repeat testing.  While 2019 novel coronavirus  (SARS-CoV-2) nucleic acids may be present in the submitted sample  additional confirmatory testing may be necessary for epidemiological  and / or clinical management purposes  to differentiate between  SARS-CoV-2 and other Sarbecovirus currently known to infect humans.  If clinically  indicated additional testing with an alternate test  methodology [phone]) is advised. The SARS-CoV-2 RNA is generally  detectable in upper and lower respiratory sp ecimens during the acute  phase of infection. The expected result is Negative. Fact Sheet for Patients:  StrictlyIdeas.no Fact Sheet for Healthcare Providers: BankingDealers.co.za This test is not yet approved or cleared by the [place] FDA and has been authorized for detection and/or diagnosis of SARS-CoV-2 by FDA under an Emergency Use Authorization (EUA).  This EUA will remain in effect (meaning this test can be used) for the duration of the COVID-19 declaration under Section 564(b)(1) of the Act, 21 U.S.C. section 360bbb-3(b)(1), unless the authorization is terminated or revoked sooner. Performed at [facility], [place] [address] [place], [place] [address]   Culture, blood (routine x 2)  Status: None (Preliminary result)   Collection Time: 03/08/19  3:00 PM  Result Value Ref Range Status   Specimen Description BLOOD RIGHT ANTECUBITAL  Final   Special Requests   Final    BOTTLES DRAWN AEROBIC AND ANAEROBIC Blood Culture adequate volume   Culture   Final    NO GROWTH 4 DAYS Performed at [facility], [address], [place], [place] [address]    Report Status PENDING  Incomplete  Culture, blood (routine x 2)     Status: None (Preliminary result)   Collection Time: 03/08/19  3:10 PM  Result Value Ref Range Status   Specimen Description BLOOD RIGHT ARM  Final   Special Requests   Final    BOTTLES DRAWN AEROBIC ONLY Blood Culture adequate volume   Culture   Final    NO GROWTH 4 DAYS Performed at [facility], [address], [place], [place] [address]    Report Status PENDING  Incomplete     Discharge Instructions:   Discharge Instructions    Diet - low sodium heart healthy   Complete by:  As directed    Discharge instructions   Complete  by:  As directed    Follow up with your primary care provider in one week. Check blood work at that time. Take a note of change in medication doses. Encourage oral fluids.   Increase activity slowly   Complete by:  As directed      Allergies as of 03/12/2019   No Known Allergies     Medication List    STOP taking these medications   temazepam 15 MG capsule Commonly known as:  RESTORIL     TAKE these medications   aspirin 81 MG EC tablet Take 1 tablet (81 mg total) by mouth daily.   busPIRone 15 MG tablet Commonly known as:  BUSPAR Take 15 mg by mouth 3 (three) times daily as needed for anxiety.   citalopram 40 MG tablet Commonly known as:  CeleXA Take 1 tablet (40 mg total) by mouth daily.   clonazePAM 1 MG tablet Commonly known as:  KLONOPIN Take 0.5 tablets (0.5 mg total) by mouth 2 (two) times daily as needed for anxiety. What changed:  when to take this   clopidogrel 75 MG tablet Commonly known as:  PLAVIX Take 1 tablet (75 mg total) by mouth daily.   donepezil 5 MG tablet Commonly known as:  ARICEPT Take 5 mg by mouth daily.   ergocalciferol 1.25 MG (50000 UT) capsule Commonly known as:  VITAMIN D2 Take 50,000 Units by mouth every Monday.   gabapentin 100 MG capsule Commonly known as:  NEURONTIN Take 1 capsule (100 mg total) by mouth 3 (three) times daily. What changed:    medication strength  how much to take   hydrALAZINE 25 MG tablet Commonly known as:  APRESOLINE Take 1 tablet (25 mg total) by mouth 3 (three) times daily.   Iron High-Potency 325 MG Tabs Take 325 mg by mouth daily with breakfast. What changed:  Another medication with the same name was removed. Continue taking this medication, and follow the directions you see here.   isosorbide mononitrate 60 MG 24 hr tablet Commonly known as:  IMDUR Take 1 tablet (60 mg total) by mouth daily.   isosorbide-hydrALAZINE 20-37.5 MG tablet Commonly known as:  BIDIL Take 2 tablets by mouth 3  (three) times daily.   magnesium oxide 400 (241.3 Mg) MG tablet Commonly known as:  MAG-OX  Take 1 tablet (400 mg total) by mouth 2 (two) times daily.   megestrol 40 MG tablet Commonly known as:  MEGACE Take 40 mg by mouth 2 (two) times daily.   metoprolol tartrate 25 MG tablet Commonly known as:  LOPRESSOR Take 0.5 tablets (12.5 mg total) by mouth 2 (two) times daily.   nicotine 21 mg/24hr patch Commonly known as:  NICODERM CQ - dosed in mg/24 hours Place 1 patch (21 mg total) onto the skin daily. Start taking on:  Mar 13, 2019   omeprazole 40 MG capsule Commonly known as:  PRILOSEC Take 1 capsule (40 mg total) by mouth daily.   potassium chloride 10 MEQ tablet Commonly known as:  K-DUR Take 1 tablet (10 mEq total) by mouth daily for 10 days.   rosuvastatin 40 MG tablet Commonly known as:  CRESTOR Take 1 tablet (40 mg total) by mouth daily. What changed:  when to take this   traMADol 50 MG tablet Commonly known as:  ULTRAM Take 1 tablet (50 mg total) by mouth every 6 (six) hours as needed for moderate pain. What changed:    how much to take  when to take this  reasons to take this   valproic acid 250 MG capsule Commonly known as:  DEPAKENE Take 1 capsule (250 mg total) by mouth 2 (two) times daily.        Time coordinating discharge: 39 minutes  Signed:  Gonzalo Waymire  [facility] 03/12/2019, 10:28 AM

## 2019-03-12 NOTE — Progress Notes (Signed)
DISCHARGE NOTE HOME Alyssa [name] to be discharged Home per MD order. Discussed prescriptions and follow up appointments with the patient. Prescriptions given to patient; medication list explained in detail. Patient verbalized understanding.  Skin clean, dry and intact without evidence of skin break down, no evidence of skin tears noted. IV catheter discontinued intact. Site without signs and symptoms of complications. Dressing and pressure applied. Pt denies pain at the site currently. No complaints noted.  Patient free of lines, drains, and wounds.   An After Visit Summary (AVS) was printed and given to the patient. Patient escorted via wheelchair, and discharged home via private auto.  Arlyss Repress, RN

## 2019-03-13 LAB — CULTURE, BLOOD (ROUTINE X 2)
Culture: NO GROWTH
Culture: NO GROWTH
Special Requests: ADEQUATE
Special Requests: ADEQUATE

## 2019-03-22 ENCOUNTER — Other Ambulatory Visit: Payer: Self-pay

## 2019-03-22 ENCOUNTER — Emergency Department (HOSPITAL_COMMUNITY): Payer: Medicare HMO

## 2019-03-22 ENCOUNTER — Encounter (HOSPITAL_COMMUNITY): Payer: Self-pay

## 2019-03-22 ENCOUNTER — Observation Stay (HOSPITAL_COMMUNITY)
Admission: EM | Admit: 2019-03-22 | Discharge: 2019-03-23 | Disposition: A | Discharge status: Home / Self Care | Payer: Medicare HMO | Attending: Internal Medicine | Admitting: Internal Medicine

## 2019-03-22 DIAGNOSIS — R0609 Other forms of dyspnea: Secondary | ICD-10-CM | POA: Diagnosis not present

## 2019-03-22 DIAGNOSIS — E8770 Fluid overload, unspecified: Secondary | ICD-10-CM | POA: Diagnosis present

## 2019-03-22 DIAGNOSIS — K449 Diaphragmatic hernia without obstruction or gangrene: Secondary | ICD-10-CM | POA: Insufficient documentation

## 2019-03-22 DIAGNOSIS — E8779 Other fluid overload: Secondary | ICD-10-CM

## 2019-03-22 DIAGNOSIS — G629 Polyneuropathy, unspecified: Secondary | ICD-10-CM | POA: Diagnosis not present

## 2019-03-22 DIAGNOSIS — N183 Chronic kidney disease, stage 3 unspecified: Secondary | ICD-10-CM | POA: Diagnosis present

## 2019-03-22 DIAGNOSIS — D259 Leiomyoma of uterus, unspecified: Secondary | ICD-10-CM | POA: Diagnosis not present

## 2019-03-22 DIAGNOSIS — D649 Anemia, unspecified: Principal | ICD-10-CM | POA: Diagnosis present

## 2019-03-22 DIAGNOSIS — F419 Anxiety disorder, unspecified: Secondary | ICD-10-CM | POA: Insufficient documentation

## 2019-03-22 DIAGNOSIS — N184 Chronic kidney disease, stage 4 (severe): Secondary | ICD-10-CM | POA: Insufficient documentation

## 2019-03-22 DIAGNOSIS — Z7902 Long term (current) use of antithrombotics/antiplatelets: Secondary | ICD-10-CM | POA: Insufficient documentation

## 2019-03-22 DIAGNOSIS — E785 Hyperlipidemia, unspecified: Secondary | ICD-10-CM | POA: Insufficient documentation

## 2019-03-22 DIAGNOSIS — I10 Essential (primary) hypertension: Secondary | ICD-10-CM

## 2019-03-22 DIAGNOSIS — Z7982 Long term (current) use of aspirin: Secondary | ICD-10-CM | POA: Insufficient documentation

## 2019-03-22 DIAGNOSIS — J90 Pleural effusion, not elsewhere classified: Secondary | ICD-10-CM | POA: Diagnosis not present

## 2019-03-22 DIAGNOSIS — Z87891 Personal history of nicotine dependence: Secondary | ICD-10-CM | POA: Diagnosis not present

## 2019-03-22 DIAGNOSIS — I252 Old myocardial infarction: Secondary | ICD-10-CM | POA: Insufficient documentation

## 2019-03-22 DIAGNOSIS — E876 Hypokalemia: Secondary | ICD-10-CM | POA: Insufficient documentation

## 2019-03-22 DIAGNOSIS — M1990 Unspecified osteoarthritis, unspecified site: Secondary | ICD-10-CM | POA: Insufficient documentation

## 2019-03-22 DIAGNOSIS — R0600 Dyspnea, unspecified: Secondary | ICD-10-CM | POA: Diagnosis present

## 2019-03-22 DIAGNOSIS — R609 Edema, unspecified: Secondary | ICD-10-CM | POA: Diagnosis present

## 2019-03-22 DIAGNOSIS — Z8249 Family history of ischemic heart disease and other diseases of the circulatory system: Secondary | ICD-10-CM | POA: Diagnosis not present

## 2019-03-22 DIAGNOSIS — Z1159 Encounter for screening for other viral diseases: Secondary | ICD-10-CM | POA: Diagnosis not present

## 2019-03-22 DIAGNOSIS — I959 Hypotension, unspecified: Secondary | ICD-10-CM | POA: Diagnosis present

## 2019-03-22 DIAGNOSIS — Z79899 Other long term (current) drug therapy: Secondary | ICD-10-CM | POA: Diagnosis not present

## 2019-03-22 DIAGNOSIS — R600 Localized edema: Secondary | ICD-10-CM | POA: Diagnosis not present

## 2019-03-22 DIAGNOSIS — F332 Major depressive disorder, recurrent severe without psychotic features: Secondary | ICD-10-CM | POA: Diagnosis not present

## 2019-03-22 DIAGNOSIS — G894 Chronic pain syndrome: Secondary | ICD-10-CM | POA: Insufficient documentation

## 2019-03-22 DIAGNOSIS — Z66 Do not resuscitate: Secondary | ICD-10-CM | POA: Diagnosis not present

## 2019-03-22 DIAGNOSIS — I129 Hypertensive chronic kidney disease with stage 1 through stage 4 chronic kidney disease, or unspecified chronic kidney disease: Secondary | ICD-10-CM | POA: Diagnosis not present

## 2019-03-22 DIAGNOSIS — I739 Peripheral vascular disease, unspecified: Secondary | ICD-10-CM | POA: Diagnosis present

## 2019-03-22 LAB — BRAIN NATRIURETIC PEPTIDE: B Natriuretic Peptide: 161.5 pg/mL — ABNORMAL HIGH (ref 0.0–100.0)

## 2019-03-22 LAB — PREPARE RBC (CROSSMATCH)

## 2019-03-22 LAB — RETICULOCYTES
Immature Retic Fract: 29.9 % — ABNORMAL HIGH (ref 2.3–15.9)
RBC.: 2.11 MIL/uL — ABNORMAL LOW (ref 3.87–5.11)
Retic Count, Absolute: 105.9 10*3/uL (ref 19.0–186.0)
Retic Ct Pct: 5 % — ABNORMAL HIGH (ref 0.4–3.1)

## 2019-03-22 LAB — COMPREHENSIVE METABOLIC PANEL
ALT: 10 U/L (ref 0–44)
AST: 21 U/L (ref 15–41)
Albumin: 3 g/dL — ABNORMAL LOW (ref 3.5–5.0)
Alkaline Phosphatase: 69 U/L (ref 38–126)
Anion gap: 5 (ref 5–15)
BUN: 35 mg/dL — ABNORMAL HIGH (ref 8–23)
CO2: 18 mmol/L — ABNORMAL LOW (ref 22–32)
Calcium: 8.1 mg/dL — ABNORMAL LOW (ref 8.9–10.3)
Chloride: 111 mmol/L (ref 98–111)
Creatinine, Ser: 1.73 mg/dL — ABNORMAL HIGH (ref 0.44–1.00)
GFR calc Af Amer: 33 mL/min — ABNORMAL LOW (ref >60)
GFR calc non Af Amer: 28 mL/min — ABNORMAL LOW (ref >60)
Glucose, Bld: 153 mg/dL — ABNORMAL HIGH (ref 70–99)
Potassium: 4.6 mmol/L (ref 3.5–5.1)
Sodium: 134 mmol/L — ABNORMAL LOW (ref 135–145)
Total Bilirubin: <0.1 mg/dL — ABNORMAL LOW (ref 0.3–1.2)
Total Protein: 6.2 g/dL — ABNORMAL LOW (ref 6.5–8.1)

## 2019-03-22 LAB — CBC WITH DIFFERENTIAL/PLATELET
Abs Immature Granulocytes: 0.04 10*3/uL (ref 0.00–0.07)
Basophils Absolute: 0 10*3/uL (ref 0.0–0.1)
Basophils Relative: 0 %
Eosinophils Absolute: 0.1 10*3/uL (ref 0.0–0.5)
Eosinophils Relative: 1 %
HCT: 19.9 % — ABNORMAL LOW (ref 36.0–46.0)
Hemoglobin: 6.4 g/dL — CL (ref 12.0–15.0)
Immature Granulocytes: 0 %
Lymphocytes Relative: 14 %
Lymphs Abs: 1.3 10*3/uL (ref 0.7–4.0)
MCH: 29.6 pg (ref 26.0–34.0)
MCHC: 32.2 g/dL (ref 30.0–36.0)
MCV: 92.1 fL (ref 80.0–100.0)
Monocytes Absolute: 0.8 10*3/uL (ref 0.1–1.0)
Monocytes Relative: 9 %
Neutro Abs: 7.1 10*3/uL (ref 1.7–7.7)
Neutrophils Relative %: 76 %
Platelets: 368 10*3/uL (ref 150–400)
RBC: 2.16 MIL/uL — ABNORMAL LOW (ref 3.87–5.11)
RDW: 18.6 % — ABNORMAL HIGH (ref 11.5–15.5)
WBC: 9.4 10*3/uL (ref 4.0–10.5)
nRBC: 0 % (ref 0.0–0.2)

## 2019-03-22 LAB — FOLATE: Folate: 9.2 ng/mL (ref >5.9)

## 2019-03-22 LAB — TROPONIN I: Troponin I: <0.03 ng/mL (ref <0.03)

## 2019-03-22 LAB — OCCULT BLOOD, POC DEVICE: Fecal Occult Bld: NEGATIVE

## 2019-03-22 MED ORDER — SODIUM CHLORIDE 0.9% IV SOLUTION
Freq: Once | INTRAVENOUS | Status: AC
  Administered 2019-03-22: via INTRAVENOUS

## 2019-03-22 MED ORDER — ALUM & MAG HYDROXIDE-SIMETH 200-200-20 MG/5ML PO SUSP
30.0000 mL | Freq: Once | ORAL | Status: AC
  Administered 2019-03-22: 22:00:00 30 mL via ORAL
  Filled 2019-03-22: qty 30

## 2019-03-22 MED ORDER — TRAMADOL HCL 50 MG PO TABS
50.0000 mg | ORAL_TABLET | Freq: Once | ORAL | Status: AC
  Administered 2019-03-22: 50 mg via ORAL
  Filled 2019-03-22: qty 1

## 2019-03-22 NOTE — ED Triage Notes (Signed)
Patient c/o bilateral leg swelling and gradually worsening x 1 month.  Patient had a Hgb 6.4 that was drawn on 03/19/19.

## 2019-03-22 NOTE — ED Notes (Signed)
Assisted provider with occult blood. Pt tolerated well.

## 2019-03-22 NOTE — ED Provider Notes (Signed)
[facility] DEPT Provider Note   CSN: [id number] Arrival date & time: 03/22/19  1711    History   Chief Complaint Chief Complaint  Patient presents with  . abnormal labs  . Leg Swelling  . Shortness of Breath    HPI Alyssa [name] is a 78 y.o. female.     The history is provided by the patient and medical records. No language interpreter was used.  Shortness of Breath     78 year old female with history of CKD, chronic headache, depression, bloody stool, presenting to the ED for evaluation of low blood count.  Patient states she was seen by her PCP for an annual checkup today.  She was told that her blood count is abnormal and will need to come to the ER for further management.  She report having bilateral leg swelling ongoing for at least 4 months.  She also complaining of having increased shortness of breath for the past month.  She did endorse having some dark stool since yesterday.  She endorsed some heartburn sensation.  She denies lightheadedness or dizziness or headache.  She denies chest pain or abdominal pain, no nausea vomiting or diarrhea.  She is not any blood thinner medication.  She has never received blood transfusion in the past.  She denies any abnormal bleeding.  She does notice occasional dark tarry stool.  She takes Pepto-Bismol on an as-needed basis.  She lives at home with her boyfriend.  She denies any recent sick contact denies any fever or productive cough.  Past Medical History:  Diagnosis Date  . Anxiety   . Arthritis   . Chest pain   . Chronic kidney disease   . Elevated troponin 12/13/2018  . Headache(784.0)   . Hyperlipidemia   . Hypertension   . Joint pain   . Leg pain   . Peripheral neuropathy   . Peripheral vascular disease [facility])     Patient Active Problem List   Diagnosis Date Noted  . CKD (chronic kidney disease) stage 3, GFR 30-59 ml/min (HCC) 03/08/2019  . Acute renal failure (ARF) ([place]) 03/08/2019   . Hypokalemia 03/08/2019  . Palliative care by specialist   . Goals of care, counseling/discussion   . Altered mental status   . Encephalopathy, toxic 12/24/2018  . Aspiration pneumonia of both lower lobes due to gastric secretions ([place]) 12/24/2018  . Non-ST elevation (NSTEMI) myocardial infarction ([place])   . Chest pain   . Dyspnea on exertion   . Elevated troponin level 12/09/2018  . Chronic migraine 03/27/2018  . Lumbar radiculopathy 03/27/2018  . Gait abnormality 03/27/2018  . CKD (chronic kidney disease) stage 4, GFR 15-29 ml/min (HCC) 12/28/2016  . Normocytic anemia 12/28/2016  . Chronic pain syndrome 12/28/2016  . Benzodiazepine withdrawal ([place]) 12/28/2016  . Benzodiazepine withdrawal without complication ([place]) 49/67/5916  . Chronic right shoulder pain 12/06/2016  . Fall 09/17/2016  . Fracture of humeral head, closed, right, initial encounter 09/17/2016  . Rib fractures 09/17/2016  . Multiple falls 09/17/2016  . Severe muscle deconditioning 09/17/2016  . Failure to thrive in adult 09/17/2016  . Melena 04/25/2016  . Essential hypertension 04/25/2016  . Anxiety state 04/25/2016  . Tobacco abuse 04/25/2016  . Hyperlipidemia 04/25/2016  . Syncope 04/24/2016  . Syncope and collapse 04/24/2016  . Gait difficulty 01/12/2016  . Foot drop, left 01/12/2016  . Severe recurrent major depression without psychotic features ([place]) 08/28/2015  . Spondylolisthesis at L4-L5 level 07/30/2015  . PVD (peripheral vascular disease)  with claudication ([place]) 07/29/2014  . Weakness-Bilateral arm/leg 07/29/2014  . Numbness-left leg 07/29/2014  . Swelling of limb-Legs 07/29/2014  . Atherosclerosis of native arteries of the extremities with intermittent claudication 09/30/2011    Past Surgical History:  Procedure Laterality Date  . ABDOMINAL ANGIOGRAM N/A 10/29/2011   Procedure: ABDOMINAL ANGIOGRAM;  Surgeon: Elam Dutch, MD;  Location: [facility] CATH LAB;  Service: Cardiovascular;  Laterality:  N/A;  . BACK SURGERY    . FEMORAL-FEMORAL BYPASS GRAFT  08/31/2010  . JOINT REPLACEMENT Right 2012   Hip  . LOWER EXTREMITY ANGIOGRAPHY  06/14/2017   Procedure: Lower Extremity Angiography;  Surgeon: Adrian Prows, MD;  Location: [place] CV LAB;  Service: Cardiovascular;;  Bilateral limited angio performed  . RENAL ANGIOGRAPHY N/A 06/14/2017   Procedure: Renal Angiography;  Surgeon: Adrian Prows, MD;  Location: [place] CV LAB;  Service: Cardiovascular;  Laterality: N/A;  . TOTAL HIP ARTHROPLASTY     right     OB History   No obstetric history on file.      Home Medications    Prior to Admission medications   Medication Sig Start Date End Date Taking? Authorizing Provider  aspirin 81 MG EC tablet Take 1 tablet (81 mg total) by mouth daily. 01/09/19   Caren Griffins, MD  busPIRone (BUSPAR) 15 MG tablet Take 15 mg by mouth 3 (three) times daily as needed for anxiety. 02/28/19   [provider]  citalopram (CELEXA) 40 MG tablet Take 1 tablet (40 mg total) by mouth daily. 01/09/19 01/09/20  Caren Griffins, MD  clonazePAM (KLONOPIN) 1 MG tablet Take 0.5 tablets (0.5 mg total) by mouth 2 (two) times daily as needed for anxiety. Patient taking differently: Take 0.5 mg by mouth 2 (two) times daily.  01/09/19   Caren Griffins, MD  clopidogrel (PLAVIX) 75 MG tablet Take 1 tablet (75 mg total) by mouth daily. 01/09/19   Caren Griffins, MD  donepezil (ARICEPT) 5 MG tablet Take 5 mg by mouth daily.    [provider]  ergocalciferol (VITAMIN D2) 50000 units capsule Take 50,000 Units by mouth every Monday.     [provider]  Ferrous Sulfate (IRON HIGH-POTENCY) 325 MG TABS Take 325 mg by mouth daily with breakfast.    [provider]  gabapentin (NEURONTIN) 100 MG capsule Take 1 capsule (100 mg total) by mouth 3 (three) times daily. 03/12/19   Pokhrel, Corrie Mckusick, MD  hydrALAZINE (APRESOLINE) 25 MG tablet Take 1 tablet (25 mg total) by mouth 3 (three) times  daily. 01/09/19   Caren Griffins, MD  isosorbide mononitrate (IMDUR) 60 MG 24 hr tablet Take 1 tablet (60 mg total) by mouth daily. 01/09/19   Gherghe, Vella Redhead, MD  isosorbide-hydrALAZINE (BIDIL) 20-37.5 MG tablet Take 2 tablets by mouth 3 (three) times daily.    [provider]  magnesium oxide (MAG-OX) 400 (241.3 Mg) MG tablet Take 1 tablet (400 mg total) by mouth 2 (two) times daily. 03/12/19   Pokhrel, Corrie Mckusick, MD  megestrol (MEGACE) 40 MG tablet Take 40 mg by mouth 2 (two) times daily.    [provider]  metoprolol tartrate (LOPRESSOR) 25 MG tablet Take 0.5 tablets (12.5 mg total) by mouth 2 (two) times daily. 03/12/19   Pokhrel, Corrie Mckusick, MD  nicotine (NICODERM CQ - DOSED IN MG/24 HOURS) 21 mg/24hr patch Place 1 patch (21 mg total) onto the skin daily. 03/13/19   Pokhrel, Corrie Mckusick, MD  omeprazole (PRILOSEC) 40 MG capsule Take  1 capsule (40 mg total) by mouth daily. Patient not taking: Reported on 03/08/2019 02/08/18   Ladene Artist, MD  potassium chloride (K-DUR) 10 MEQ tablet Take 1 tablet (10 mEq total) by mouth daily for 10 days. 03/12/19 03/22/19  Pokhrel, Corrie Mckusick, MD  rosuvastatin (CRESTOR) 40 MG tablet Take 1 tablet (40 mg total) by mouth daily. Patient taking differently: Take 40 mg by mouth at bedtime.  01/09/19   Caren Griffins, MD  traMADol (ULTRAM) 50 MG tablet Take 1 tablet (50 mg total) by mouth every 6 (six) hours as needed for moderate pain. Patient taking differently: Take 25-50 mg by mouth every 8 (eight) hours as needed (for pain).  01/09/19   Caren Griffins, MD  valproic acid (DEPAKENE) 250 MG capsule Take 1 capsule (250 mg total) by mouth 2 (two) times daily. 01/09/19   Caren Griffins, MD    Family History Family History  Problem Relation Age of Onset  . Heart attack Mother   . Heart disease Mother   . Hyperlipidemia Mother   . Hypertension Mother   . Heart attack Father   . Heart disease Father        Before age 75  . Hyperlipidemia Father   .  Hypertension Father   . Cancer Sister        brain tumor  . Aneurysm Brother        brain  . Colon cancer Neg Hx   . Liver cancer Neg Hx     Social History Social History   Tobacco Use  . Smoking status: Former Smoker    Packs/day: 0.25    Types: Cigarettes    Last attempt to quit: 11/22/2018    Years since quitting: 0.3  . Smokeless tobacco: Never Used  . Tobacco comment: 5 cigs a day   Substance Use Topics  . Alcohol use: No  . Drug use: No     Allergies   Patient has no known allergies.   Review of Systems Review of Systems  Respiratory: Positive for shortness of breath.   All other systems reviewed and are negative.    Physical Exam Updated Vital Signs BP (!) 145/81   Pulse 68   Temp (!) 97.4 F (36.3 C) (Oral)   Resp 15   Ht 5\' 2"  (1.575 m)   Wt 32.7 kg   SpO2 100%   BMI 13.17 kg/m   Physical Exam Vitals signs and nursing note reviewed.  Constitutional:      General: She is not in acute distress.    Appearance: She is well-developed.  HENT:     Head: Atraumatic.  Eyes:     Conjunctiva/sclera: Conjunctivae normal.  Neck:     Musculoskeletal: Neck supple.     Vascular: JVD present.  Cardiovascular:     Rate and Rhythm: Normal rate and regular rhythm.  Pulmonary:     Effort: Pulmonary effort is normal.     Breath sounds: Normal breath sounds. No wheezing, rhonchi or rales.  Chest:     Chest wall: No tenderness.  Abdominal:     Palpations: Abdomen is soft.     Tenderness: There is no abdominal tenderness.  Musculoskeletal:     Right lower leg: Edema present.     Left lower leg: Edema present.     Comments: Bilateral 2+ pitting edema to lower extremities.  Skin:    Capillary Refill: Capillary refill takes less than 2 seconds.     Findings: No rash.  Neurological:     General: No focal deficit present.     Mental Status: She is alert.  Psychiatric:        Mood and Affect: Mood normal.      ED Treatments / Results  Labs (all labs  ordered are listed, but only abnormal results are displayed) Labs Reviewed  CBC WITH DIFFERENTIAL/PLATELET - Abnormal; Notable for the following components:      Result Value   RBC 2.16 (*)    Hemoglobin 6.4 (*)    HCT 19.9 (*)    RDW 18.6 (*)    All other components within normal limits  COMPREHENSIVE METABOLIC PANEL - Abnormal; Notable for the following components:   Sodium 134 (*)    CO2 18 (*)    Glucose, Bld 153 (*)    BUN 35 (*)    Creatinine, Ser 1.73 (*)    Calcium 8.1 (*)    Total Protein 6.2 (*)    Albumin 3.0 (*)    Total Bilirubin <0.1 (*)    GFR calc non Af Amer 28 (*)    GFR calc Af Amer 33 (*)    All other components within normal limits  BRAIN NATRIURETIC PEPTIDE - Abnormal; Notable for the following components:   B Natriuretic Peptide 161.5 (*)    All other components within normal limits  RETICULOCYTES - Abnormal; Notable for the following components:   Retic Ct Pct 5.0 (*)    RBC. 2.11 (*)    Immature Retic Fract 29.9 (*)    All other components within normal limits  SARS CORONAVIRUS 2 (HOSPITAL ORDER, [facility])  TROPONIN I  FOLATE  URINALYSIS, ROUTINE W REFLEX MICROSCOPIC  VITAMIN B12  IRON AND TIBC  FERRITIN  POC OCCULT BLOOD, ED  OCCULT BLOOD, POC DEVICE  TYPE AND SCREEN  PREPARE RBC (CROSSMATCH)  ABO/RH    EKG EKG Interpretation  Date/Time:  Thursday March 22 2019 17:52:38 EDT Ventricular Rate:  79 PR Interval:    QRS Duration: 82 QT Interval:  426 QTC Calculation: 489 R Axis:   81 Text Interpretation:  Sinus rhythm Borderline right axis deviation Consider left ventricular hypertrophy Borderline T abnormalities, anterior leads Borderline prolonged QT interval Baseline wander in lead(s) I aVR aVL no significant change since May 2020 Confirmed by Sherwood Gambler [id number]) on 03/22/2019 10:25:36 PM   Radiology Dg Chest 2 View  Result Date: 03/22/2019 CLINICAL DATA:  Shortness of breath with anemia EXAM: CHEST - 2  VIEW COMPARISON:  03/08/2019 FINDINGS: The heart size is stable. Aortic calcifications are noted. There is some pleuroparenchymal scarring at the lung apices. A background of emphysematous changes is suspected. There is some blunting of the left costophrenic angle. There are old healed left-sided rib fractures. There is no evidence of acute osseous abnormality. There are advanced degenerative changes of the midthoracic spine. IMPRESSION: 1. Stable cardiac silhouette. 2. Interval development of a small left-sided pleural effusion. There is a new opacity at the left costophrenic angle which may represent atelectasis or infiltrate. 3. The lungs remain hyperexpanded which can be seen in patients with COPD. Electronically Signed   By: Constance Holster M.D.   On: 03/22/2019 19:29    Procedures .Critical Care Performed by: Domenic Moras, PA-C Authorized by: Domenic Moras, PA-C   Critical care provider statement:    Critical care time (minutes):  45   Critical care was time spent personally by me on the following activities:  Discussions with consultants, evaluation of  patient's response to treatment, examination of patient, ordering and performing treatments and interventions, ordering and review of laboratory studies, ordering and review of radiographic studies, pulse oximetry, re-evaluation of patient's condition, obtaining history from patient or surrogate and review of old charts   (including critical care time)  Medications Ordered in ED Medications  0.9 %  sodium chloride infusion (Manually program via Guardrails IV Fluids) (has no administration in time range)  alum & mag hydroxide-simeth (MAALOX/MYLANTA) 200-200-20 MG/5ML suspension 30 mL (30 mLs Oral Given 03/22/19 2145)  traMADol (ULTRAM) tablet 50 mg (50 mg Oral Given 03/22/19 2304)     Initial Impression / Assessment and Plan / ED Course  I have reviewed the triage vital signs and the nursing notes.  Pertinent labs & imaging results that were  available during my care of the patient were reviewed by me and considered in my medical decision making (see chart for details).        BP 132/76   Pulse 81   Temp 97.9 F (36.6 C)   Resp 15   Ht 5\' 2"  (1.575 m)   Wt 32.7 kg   SpO2 100%   BMI 13.17 kg/m    Final Clinical Impressions(s) / ED Diagnoses   Final diagnoses:  Symptomatic anemia  Peripheral edema    ED Discharge Orders    None     11:12 PM Patient with history of chronic kidney disease who has been having aggressive worsening shortness of breath for nearly a month as well as bilateral leg swelling for the past several months.  She was seen by her PCP today for regular checkup where she was found to be anemic with hemoglobin in the 6 range.  She was sent here for further management of her condition.  Labs obtained today showing a hemoglobin of 6.4.  Her baseline hemoglobin is usually 7-8.  Her BNP is 161, improved from prior.  Fecal occult blood test is negative.  Given her shortness of breath as well as low hemoglobin requiring blood transfusion, patient will be admitted for further management of her condition.  Bilateral leg swelling will need to be evaluated further in the hospital but I have low suspicion for bilateral DVT. Care discussed with Dr. Regenia Skeeter.  Patient also has chronic right hip pain, pain medication given.  Appreciate consultation from [facility] Dr. Melvia Heaps who agrees to see and admit pt for further care.  Pt is currently receiving blood transfusion.    Alyssa [name] was evaluated in Emergency Department on 03/22/2019 for the symptoms described in the history of present illness. She was evaluated in the context of the global COVID-19 pandemic, which necessitated consideration that the patient might be at risk for infection with the SARS-CoV-2 virus that causes COVID-19. Institutional protocols and algorithms that pertain to the evaluation of patients at risk for COVID-19 are in a state of  rapid change based on information released by regulatory bodies including the CDC and federal and state organizations. These policies and algorithms were followed during the patient's care in the ED.    Domenic Moras, PA-C 03/22/19 2316    [place]wood Gambler, MD 03/29/19 [phone]

## 2019-03-22 NOTE — H&P (Addendum)
[facility] History and Physical  BRITHANY [name] YHC:[id number] DOB: Jan 10, 1941 DOA: 03/22/2019  Referring physician: Dr Tyron Russell  PCP: Everardo Beals, NP   Chief Complaint: SOB, leg swelling, weakness, low Hb from PCP's office 6's  HPI: Alyssa [name] is a 78 y.o. female with hx of HTN, HL, CKD 3, PAD sp fem-fem bypass graft 2013, hx back surgery, R THA, presented w/ SOB and generalized pain.  States SOB all the time, lasting "for a while".  No cough, chest pain or fevers.  No abd pain or n/v/d.  Voiding OK.  Saw PCP today and Hb was low in 6's.    Patient lives alone but has some body staying with her at this time.  Never married.  Past smoker, no etoh.  Has a walker and cane and wheelchair at home, uses the walker the most.  Denies any bloody or tarry stools. Has had sig leg swelling lately, doesn't take any fluid pills.      Recent admits:  May 2020 - admit for AKI/ dehydration, confusion, gen'd weak, poor appetite, fall, leg swelling. AKI on CKD 3, improved w/ IVFs. 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 Chronic pain syndrome. gen'd weakness > PT/OT rec'd SNF but family wanted to take pt home w/ [facility] services. Dc'd to home.   March 2020 - aspiration PNA, HCAP and resp faliure, TME w/ agitation, CKD 4, recent NSTEMI in Feb, HTN anemia chronic disease  Feb 2020 - NSTEMI w/ deep TWI ant-lateral, seen by cards recommended conservative Rx.  SOB , prior smoker.  Cdif neg for diarrhea. CKD 4.  PAD. Chronic pain.      ROS  denies CP  no joint pain   no HA  no blurry vision  no rash  no diarrhea  no nausea/ vomiting     Past Medical History  Past Medical History:  Diagnosis Date  . Anxiety   . Arthritis   . Chest pain   . Chronic kidney disease   . Elevated troponin 12/13/2018  . Headache(784.0)   . Hyperlipidemia   . Hypertension   . Joint pain   . Leg pain   . Peripheral neuropathy   . Peripheral vascular disease [facility])     Past Surgical History  Past Surgical History:  Procedure Laterality Date  . ABDOMINAL ANGIOGRAM N/A 10/29/2011   Procedure: ABDOMINAL ANGIOGRAM;  Surgeon: Elam Dutch, MD;  Location: [facility] CATH LAB;  Service: Cardiovascular;  Laterality: N/A;  . BACK SURGERY    . FEMORAL-FEMORAL BYPASS GRAFT  08/31/2010  . JOINT REPLACEMENT Right 2012   Hip  . LOWER EXTREMITY ANGIOGRAPHY  06/14/2017   Procedure: Lower Extremity Angiography;  Surgeon: Adrian Prows, MD;  Location: [facility] CV LAB;  Service: Cardiovascular;;  Bilateral limited angio performed  . RENAL ANGIOGRAPHY N/A 06/14/2017   Procedure: Renal Angiography;  Surgeon: Adrian Prows, MD;  Location: [facility] CV LAB;  Service: Cardiovascular;  Laterality: N/A;  . TOTAL HIP ARTHROPLASTY     right   Family History  Family History  Problem Relation Age of Onset  . Heart attack Mother   . Heart disease Mother   . Hyperlipidemia Mother   . Hypertension Mother   . Heart attack Father   . Heart disease Father        Before age 53  . Hyperlipidemia Father   . Hypertension Father   . Cancer Sister        brain tumor  .  Aneurysm Brother        brain  . Colon cancer Neg Hx   . Liver cancer Neg Hx    Social History  reports that she quit smoking about 3 months ago. Her smoking use included cigarettes. She smoked 0.25 packs per day. She has never used smokeless tobacco. She reports that she does not drink alcohol or use drugs. Allergies No Known Allergies Home medications Prior to Admission medications   Medication Sig Start Date End Date Taking? Authorizing Provider  aspirin 81 MG EC tablet Take 1 tablet (81 mg total) by mouth daily. 01/09/19  Yes Gherghe, Vella Redhead, MD  busPIRone (BUSPAR) 15 MG tablet Take 15 mg by mouth 3 (three) times daily as needed for anxiety. 02/28/19  Yes [provider]  citalopram (CELEXA) 40 MG tablet Take 1 tablet (40 mg total) by mouth daily. 01/09/19 01/09/20 Yes Gherghe, Vella Redhead, MD  clonazePAM  (KLONOPIN) 1 MG tablet Take 0.5 tablets (0.5 mg total) by mouth 2 (two) times daily as needed for anxiety. Patient taking differently: Take 0.5 mg by mouth at bedtime.  01/09/19  Yes Caren Griffins, MD  clopidogrel (PLAVIX) 75 MG tablet Take 1 tablet (75 mg total) by mouth daily. 01/09/19  Yes Gherghe, Vella Redhead, MD  donepezil (ARICEPT) 5 MG tablet Take 5 mg by mouth daily.   Yes [provider]  ergocalciferol (VITAMIN D2) 50000 units capsule Take 50,000 Units by mouth every Monday.    Yes [provider]  Ferrous Sulfate (IRON HIGH-POTENCY) 325 MG TABS Take 325 mg by mouth daily with breakfast.   Yes [provider]  gabapentin (NEURONTIN) 100 MG capsule Take 1 capsule (100 mg total) by mouth 3 (three) times daily. 03/12/19  Yes Pokhrel, Laxman, MD  hydrALAZINE (APRESOLINE) 25 MG tablet Take 1 tablet (25 mg total) by mouth 3 (three) times daily. 01/09/19  Yes Caren Griffins, MD  isosorbide mononitrate (IMDUR) 60 MG 24 hr tablet Take 1 tablet (60 mg total) by mouth daily. 01/09/19  Yes Gherghe, Vella Redhead, MD  isosorbide-hydrALAZINE (BIDIL) 20-37.5 MG tablet Take 2 tablets by mouth 3 (three) times daily.   Yes [provider]  magnesium oxide (MAG-OX) 400 (241.3 Mg) MG tablet Take 1 tablet (400 mg total) by mouth 2 (two) times daily. 03/12/19  Yes Pokhrel, Laxman, MD  megestrol (MEGACE) 40 MG tablet Take 40 mg by mouth 2 (two) times daily.   Yes [provider]  metoprolol tartrate (LOPRESSOR) 25 MG tablet Take 0.5 tablets (12.5 mg total) by mouth 2 (two) times daily. 03/12/19  Yes Pokhrel, Laxman, MD  nicotine (NICODERM CQ - DOSED IN MG/24 HOURS) 21 mg/24hr patch Place 1 patch (21 mg total) onto the skin daily. 03/13/19  Yes Pokhrel, Laxman, MD  potassium chloride (K-DUR) 10 MEQ tablet Take 1 tablet (10 mEq total) by mouth daily for 10 days. 03/12/19 03/22/19 Yes Pokhrel, Laxman, MD  rosuvastatin (CRESTOR) 40 MG tablet Take 1 tablet (40 mg total) by mouth  daily. Patient taking differently: Take 40 mg by mouth at bedtime.  01/09/19  Yes Gherghe, Vella Redhead, MD  traMADol (ULTRAM) 50 MG tablet Take 1 tablet (50 mg total) by mouth every 6 (six) hours as needed for moderate pain. Patient taking differently: Take 25-50 mg by mouth every 8 (eight) hours as needed (for pain).  01/09/19  Yes Caren Griffins, MD  valproic acid (DEPAKENE) 250 MG capsule Take 1 capsule (250 mg total) by mouth 2 (two) times  daily. 01/09/19  Yes Caren Griffins, MD  omeprazole (PRILOSEC) 40 MG capsule Take 1 capsule (40 mg total) by mouth daily. Patient not taking: Reported on 03/08/2019 02/08/18   Ladene Artist, MD   Liver Function Tests Recent Labs  Lab 03/22/19 1852  AST 21  ALT 10  ALKPHOS 69  BILITOT <0.1*  PROT 6.2*  ALBUMIN 3.0*   No results for input(s): LIPASE, AMYLASE in the last 168 hours. CBC Recent Labs  Lab 03/22/19 1852  WBC 9.4  NEUTROABS 7.1  HGB 6.4*  HCT 19.9*  MCV 92.1  PLT 734   Basic Metabolic Panel Recent Labs  Lab 03/22/19 1852  NA 134*  K 4.6  CL 111  CO2 18*  GLUCOSE 153*  BUN 35*  CREATININE 1.73*  CALCIUM 8.1*     Vitals:   03/22/19 2330 03/22/19 2338 03/22/19 2354 03/22/19 2358  BP: 130/75 130/75 130/73 (!) 147/76  Pulse: 74 76 74   Resp:  '17 15 15  '$ Temp: 98.2 F (36.8 C) 98.2 F (36.8 C) 97.8 F (36.6 C) 98.3 F (36.8 C)  TempSrc: Oral Oral Oral Oral  SpO2:  100% 100% 100%  Weight:      Height:       Exam: Gen alert, chronically ill and frail elderly AAF, pleasant No rash, cyanosis or gangrene Sclera anicteric, throat clear  No jvd or bruits Chest crackles L base, R clear RRR no MRG Abd soft ntnd no mass or ascites +bs GU defer MS no joint effusions or deformity Ext 2+ diffuse bilat LE edema ankles to hips / no wounds or ulcers Neuro is alert, Ox 3 , nf, gen'd weakness     Home meds:  - aspirin 81/ clopidogrel 75 / isosorbide mononitrate 60 qd/ rosuvastatin 40  - tramadol 50 qid prn/  valproic acid 250 bid/ gabapentin 100 tid/ donepezil 5/ clonazepam 0.'5mg'$  hs/ citalopram 40 qd/ buspirone '15mg'$  tid prn  - hydralazine 25 tid/ isosorbide- hydralazine 20- 37.5 take 2 po bid/ metoprolol 12.5 bid  - omeprazole 40 qd  - prn's/ vitamins/ supplements  ED labs: Na 134  K 4.6  CO2 18  BUN 35  Cr 1.73   eGFR 33  Alb 3.0  LFT's wnl  AG 5  WBC 9k  Hb 6.4  plt 368  BNP 161  Trop < 0.03  TSH 1.07  FOB negative  EKG (independ reviewed) > NSR, no change from prior CXR (independ reviewed) > IMPRESSION: 1. Stable cardiac silhouette. 2. Interval development of a small left-sided pleural effusion. There is a new opacity at the left costophrenic angle which may represent atelectasis or infiltrate. 3. The lungs remain hyperexpanded which can be seen in patients with COPD.  CT abd /pelvis on 03/09/19 > IMPRESSION: 1. Moderate to marked degradation, secondary to multiple factors detailed above.   2. Moderate hiatal hernia with apparent wall thickening within the herniated stomach. This could at least partially be due to underdistention. Given the clinical history of weight loss, endoscopy should be considered to exclude gastric neoplasm. 3. Suspicion of left-sided colitis, suboptimally evaluated. Consider infection or ischemia. 4. Uterine fibroids. 5. Coronary artery atherosclerosis. Aortic Atherosclerosis 6. Mild common duct dilatation for age. Correlate with bilirubin level. If elevated, consider MRCP or ERCP.   Assessment: 1. Anemia - recurrent issues, hx of CKD stage 3b prob contributing.  On active bleeding. Guiac neg today. Was here recently in May w/ Hb 8, now in 6's.  Fluid overloaded as well  which may drive the Hb down. Plan admit OBV, prbc's x 1, check fe/ tibc and repeat Hb am , further prbc's as indicated.    2. Vol overload - no CHF on CXR, sig leg edema due to fluid retention/ CKD. Give lasix 20- 40 IV bid and give po lasix on dc.   3. DNR - discussed code status , pt "doesn't want to be  on any machines", doesn't want CPR, etc.  DNR ordered.  4. CKD 3 - eGFR 33 5. PAD hx of fem-fem BPG in 2013 - cont meds asa/ plavix 6. HTN - bp's up , cont meds 7. Depression 8. Chronic pain 9. Dispo - lives at home alone usually but has help now     Plan - as above   DVT prophylaxis: lovenox renal dose Family communication: none here Code status: DNR Admit status: Med surg Bed type: Tina Griffiths MD  pgr [phone] 03/23/2019, 12:01 AM  If 7PM-7AM, please contact night-coverage [email or web address] Password [email or web address] 03/23/2019, 12:01 AM

## 2019-03-22 NOTE — ED Notes (Signed)
Pt given ham sandwich and coke.

## 2019-03-23 ENCOUNTER — Encounter (HOSPITAL_COMMUNITY): Payer: Self-pay | Admitting: Nephrology

## 2019-03-23 ENCOUNTER — Other Ambulatory Visit: Payer: Self-pay

## 2019-03-23 DIAGNOSIS — R609 Edema, unspecified: Secondary | ICD-10-CM | POA: Diagnosis present

## 2019-03-23 DIAGNOSIS — D649 Anemia, unspecified: Secondary | ICD-10-CM | POA: Diagnosis not present

## 2019-03-23 DIAGNOSIS — E8770 Fluid overload, unspecified: Secondary | ICD-10-CM | POA: Diagnosis present

## 2019-03-23 DIAGNOSIS — Z66 Do not resuscitate: Secondary | ICD-10-CM | POA: Diagnosis present

## 2019-03-23 LAB — TYPE AND SCREEN
ABO/RH(D): O POS
Antibody Screen: NEGATIVE
Unit division: 0

## 2019-03-23 LAB — IRON AND TIBC
Iron: 44 ug/dL (ref 28–170)
Saturation Ratios: 20 % (ref 10.4–31.8)
TIBC: 219 ug/dL — ABNORMAL LOW (ref 250–450)
UIBC: 175 ug/dL

## 2019-03-23 LAB — URINALYSIS, ROUTINE W REFLEX MICROSCOPIC
Bilirubin Urine: NEGATIVE
Glucose, UA: NEGATIVE mg/dL
Hgb urine dipstick: NEGATIVE
Ketones, ur: NEGATIVE mg/dL
Nitrite: NEGATIVE
Protein, ur: 30 mg/dL — AB
Specific Gravity, Urine: 1.011 (ref 1.005–1.030)
pH: 6 (ref 5.0–8.0)

## 2019-03-23 LAB — BASIC METABOLIC PANEL
Anion gap: 4 — ABNORMAL LOW (ref 5–15)
BUN: 33 mg/dL — ABNORMAL HIGH (ref 8–23)
CO2: 18 mmol/L — ABNORMAL LOW (ref 22–32)
Calcium: 7.9 mg/dL — ABNORMAL LOW (ref 8.9–10.3)
Chloride: 113 mmol/L — ABNORMAL HIGH (ref 98–111)
Creatinine, Ser: 1.59 mg/dL — ABNORMAL HIGH (ref 0.44–1.00)
GFR calc Af Amer: 36 mL/min — ABNORMAL LOW (ref >60)
GFR calc non Af Amer: 31 mL/min — ABNORMAL LOW (ref >60)
Glucose, Bld: 86 mg/dL (ref 70–99)
Potassium: 4.9 mmol/L (ref 3.5–5.1)
Sodium: 135 mmol/L (ref 135–145)

## 2019-03-23 LAB — CBC
HCT: 26 % — ABNORMAL LOW (ref 36.0–46.0)
Hemoglobin: 8.4 g/dL — ABNORMAL LOW (ref 12.0–15.0)
MCH: 29.9 pg (ref 26.0–34.0)
MCHC: 32.3 g/dL (ref 30.0–36.0)
MCV: 92.5 fL (ref 80.0–100.0)
Platelets: 328 10*3/uL (ref 150–400)
RBC: 2.81 MIL/uL — ABNORMAL LOW (ref 3.87–5.11)
RDW: 18.8 % — ABNORMAL HIGH (ref 11.5–15.5)
WBC: 8.4 10*3/uL (ref 4.0–10.5)
nRBC: 0 % (ref 0.0–0.2)

## 2019-03-23 LAB — BPAM RBC
Blood Product Expiration Date: 202007042359
ISSUE DATE / TIME: 202006042330
Unit Type and Rh: 5100

## 2019-03-23 LAB — VITAMIN B12: Vitamin B-12: 591 pg/mL (ref 180–914)

## 2019-03-23 LAB — SARS CORONAVIRUS 2 BY RT PCR (HOSPITAL ORDER, PERFORMED IN ~~LOC~~ HOSPITAL LAB): SARS Coronavirus 2: NEGATIVE

## 2019-03-23 LAB — FERRITIN: Ferritin: 338 ng/mL — ABNORMAL HIGH (ref 11–307)

## 2019-03-23 LAB — ABO/RH: ABO/RH(D): O POS

## 2019-03-23 MED ORDER — CITALOPRAM HYDROBROMIDE 20 MG PO TABS
40.0000 mg | ORAL_TABLET | Freq: Every day | ORAL | Status: DC
  Administered 2019-03-23: 10:00:00 40 mg via ORAL
  Filled 2019-03-23: qty 2

## 2019-03-23 MED ORDER — ONDANSETRON HCL 4 MG PO TABS: 4.0000 mg | ORAL_TABLET | Freq: Four times a day (QID) | ORAL | Status: DC | PRN

## 2019-03-23 MED ORDER — ACETAMINOPHEN 325 MG PO TABS
650.0000 mg | ORAL_TABLET | Freq: Four times a day (QID) | ORAL | Status: DC | PRN
  Administered 2019-03-23: 03:00:00 650 mg via ORAL
  Filled 2019-03-23: qty 2

## 2019-03-23 MED ORDER — ISOSORB DINITRATE-HYDRALAZINE 20-37.5 MG PO TABS
2.0000 | ORAL_TABLET | Freq: Three times a day (TID) | ORAL | Status: DC
  Administered 2019-03-23: 2 via ORAL
  Filled 2019-03-23 (×2): qty 2

## 2019-03-23 MED ORDER — NICOTINE 21 MG/24HR TD PT24
21.0000 mg | MEDICATED_PATCH | Freq: Every day | TRANSDERMAL | Status: DC
  Administered 2019-03-23: 10:00:00 21 mg via TRANSDERMAL
  Filled 2019-03-23: qty 1

## 2019-03-23 MED ORDER — POLYETHYLENE GLYCOL 3350 17 G PO PACK: 17.0000 g | PACK | Freq: Every day | ORAL | Status: DC | PRN

## 2019-03-23 MED ORDER — FUROSEMIDE 40 MG PO TABS: 40.0000 mg | ORAL_TABLET | Freq: Every day | ORAL | 0 refills | Status: DC

## 2019-03-23 MED ORDER — ISOSORBIDE MONONITRATE ER 60 MG PO TB24
60.0000 mg | ORAL_TABLET | Freq: Every day | ORAL | Status: DC
  Administered 2019-03-23: 60 mg via ORAL
  Filled 2019-03-23: qty 1

## 2019-03-23 MED ORDER — SODIUM CHLORIDE 0.9% FLUSH
3.0000 mL | Freq: Two times a day (BID) | INTRAVENOUS | Status: DC
  Administered 2019-03-23 (×2): 3 mL via INTRAVENOUS

## 2019-03-23 MED ORDER — ASPIRIN EC 81 MG PO TBEC
81.0000 mg | DELAYED_RELEASE_TABLET | Freq: Every day | ORAL | Status: DC
  Administered 2019-03-23: 81 mg via ORAL
  Filled 2019-03-23: qty 1

## 2019-03-23 MED ORDER — HYDRALAZINE HCL 25 MG PO TABS
25.0000 mg | ORAL_TABLET | Freq: Three times a day (TID) | ORAL | Status: DC
  Administered 2019-03-23: 10:00:00 25 mg via ORAL
  Filled 2019-03-23: qty 1

## 2019-03-23 MED ORDER — VALPROIC ACID 250 MG PO CAPS
250.0000 mg | ORAL_CAPSULE | Freq: Two times a day (BID) | ORAL | Status: DC
  Administered 2019-03-23: 250 mg via ORAL
  Filled 2019-03-23: qty 1

## 2019-03-23 MED ORDER — TRAMADOL HCL 50 MG PO TABS: 25.0000 mg | ORAL_TABLET | Freq: Three times a day (TID) | ORAL | Status: DC | PRN

## 2019-03-23 MED ORDER — CLONAZEPAM 0.5 MG PO TABS: 0.5000 mg | ORAL_TABLET | Freq: Every day | ORAL | Status: DC

## 2019-03-23 MED ORDER — ENOXAPARIN SODIUM 30 MG/0.3ML ~~LOC~~ SOLN
30.0000 mg | SUBCUTANEOUS | Status: DC
  Administered 2019-03-23: 30 mg via SUBCUTANEOUS
  Filled 2019-03-23: qty 0.3

## 2019-03-23 MED ORDER — ONDANSETRON HCL 4 MG/2ML IJ SOLN: 4.0000 mg | Freq: Four times a day (QID) | INTRAMUSCULAR | Status: DC | PRN

## 2019-03-23 MED ORDER — METOPROLOL TARTRATE 25 MG PO TABS
12.5000 mg | ORAL_TABLET | Freq: Two times a day (BID) | ORAL | Status: DC
  Administered 2019-03-23: 10:00:00 12.5 mg via ORAL
  Filled 2019-03-23: qty 1

## 2019-03-23 MED ORDER — CITALOPRAM HYDROBROMIDE 20 MG PO TABS: 20.0000 mg | ORAL_TABLET | Freq: Every day | ORAL | 1 refills | Status: DC

## 2019-03-23 MED ORDER — FUROSEMIDE 10 MG/ML IJ SOLN
40.0000 mg | Freq: Two times a day (BID) | INTRAMUSCULAR | Status: DC
  Administered 2019-03-23: 06:00:00 40 mg via INTRAVENOUS
  Filled 2019-03-23: qty 4

## 2019-03-23 MED ORDER — MEGESTROL ACETATE 40 MG PO TABS
40.0000 mg | ORAL_TABLET | Freq: Two times a day (BID) | ORAL | Status: DC
  Administered 2019-03-23: 40 mg via ORAL
  Filled 2019-03-23: qty 1

## 2019-03-23 MED ORDER — POTASSIUM CHLORIDE ER 20 MEQ PO TBCR: 20.0000 meq | EXTENDED_RELEASE_TABLET | Freq: Every day | ORAL | 0 refills | Status: DC

## 2019-03-23 MED ORDER — CLOPIDOGREL BISULFATE 75 MG PO TABS
75.0000 mg | ORAL_TABLET | Freq: Every day | ORAL | Status: DC
  Administered 2019-03-23: 75 mg via ORAL
  Filled 2019-03-23: qty 1

## 2019-03-23 MED ORDER — FERROUS SULFATE 325 (65 FE) MG PO TABS
325.0000 mg | ORAL_TABLET | Freq: Every day | ORAL | Status: DC
  Administered 2019-03-23: 325 mg via ORAL
  Filled 2019-03-23: qty 1

## 2019-03-23 MED ORDER — DONEPEZIL HCL 5 MG PO TABS
5.0000 mg | ORAL_TABLET | Freq: Every day | ORAL | Status: DC
  Administered 2019-03-23: 5 mg via ORAL
  Filled 2019-03-23: qty 1

## 2019-03-23 MED ORDER — ACETAMINOPHEN 650 MG RE SUPP: 650.0000 mg | Freq: Four times a day (QID) | RECTAL | Status: DC | PRN

## 2019-03-23 MED ORDER — SODIUM CHLORIDE 0.9 % IV SOLN: 250.0000 mL | INTRAVENOUS | Status: DC | PRN

## 2019-03-23 MED ORDER — ROSUVASTATIN CALCIUM 20 MG PO TABS: 40.0000 mg | ORAL_TABLET | Freq: Every day | ORAL | Status: DC

## 2019-03-23 MED ORDER — VITAMIN D (ERGOCALCIFEROL) 1.25 MG (50000 UNIT) PO CAPS: 50000.0000 [IU] | ORAL_CAPSULE | ORAL | Status: DC

## 2019-03-23 MED ORDER — SODIUM CHLORIDE 0.9% FLUSH: 3.0000 mL | INTRAVENOUS | Status: DC | PRN

## 2019-03-23 MED ORDER — GABAPENTIN 100 MG PO CAPS
100.0000 mg | ORAL_CAPSULE | Freq: Three times a day (TID) | ORAL | Status: DC
  Administered 2019-03-23: 100 mg via ORAL
  Filled 2019-03-23: qty 1

## 2019-03-23 NOTE — ED Notes (Signed)
ED TO INPATIENT HANDOFF REPORT  Name/Age/Gender Alyssa [name] 78 y.o. female  Code Status Code Status History    Date Active Date Inactive Code Status Order ID Comments User Context   03/08/2019 1819 03/12/2019 1741 Full Code [id number]  Donne Hazel, MD ED   12/31/2018 1400 01/09/2019 1450 DNR [id number]  Micheline Rough, MD Inpatient   12/24/2018 1641 12/31/2018 1400 Full Code [id number]  Shoshone, Miguel Barrera, DO ED   12/10/2018 1837 12/13/2018 1920 DNR [id number]  Adrian Prows, MD Inpatient   12/09/2018 1647 12/10/2018 1836 Full Code [id number]  Truett Mainland, DO Inpatient   06/23/2017 1811 06/24/2017 1915 Full Code [id number]  Doreatha Lew, MD ED   06/14/2017 1209 06/14/2017 1824 Full Code [id number]  Nigel Mormon, MD Inpatient   12/28/2016 0016 12/29/2016 2137 Full Code [id number]  Edwin Dada, MD ED   09/17/2016 2114 09/20/2016 2340 Full Code [id number]  Roney Jaffe, MD Inpatient   04/24/2016 2321 04/26/2016 1845 Full Code [id number]  Norval Morton, MD ED   08/27/2015 2101 08/30/2015 1456 Full Code [id number]  Harvel Quale, MD ED   08/26/2015 1802 08/27/2015 0345 Full Code [id number]  Voncille Lo, MD ED   08/01/2015 1727 08/04/2015 1656 Full Code [id number]  Kary Kos, MD Inpatient   07/30/2015 1848 08/01/2015 1727 Full Code [id number]  Kary Kos, MD ED      Home/SNF/Other Home  Chief Complaint Abnormal Lab/Fluid in leg  Level of Care/Admitting Diagnosis ED Disposition    ED Disposition Condition [place]: [facility] [100102]  Level of Care: Med-Surg [16]  Covid Evaluation: Confirmed COVID Negative  Diagnosis: Symptomatic anemia [4287681]  Admitting Physician: Roney Jaffe [[id number]]  Attending Physician: Roney Jaffe [[id number]]  PT Class (Do Not Modify): Observation [104]  PT Acc Code (Do Not Modify): Observation [10022]       Medical History Past Medical History:  Diagnosis Date  . Anxiety   . Arthritis   . Chest  pain   . Chronic kidney disease   . Elevated troponin 12/13/2018  . Headache(784.0)   . Hyperlipidemia   . Hypertension   . Joint pain   . Leg pain   . Peripheral neuropathy   . Peripheral vascular disease ([place])     Allergies No Known Allergies  IV Location/Drains/Wounds Patient Lines/Drains/Airways Status   Active Line/Drains/Airways    Name:   Placement date:   Placement time:   Site:   Days:   Peripheral IV 03/22/19 Left;Lateral Antecubital   03/22/19    2032    Antecubital   1   External Urinary Catheter   01/02/19    1000    -   80          Labs/Imaging Results for orders placed or performed during the hospital encounter of 03/22/19 (from the past 48 hour(s))  CBC with Differential/Platelet     Status: Abnormal   Collection Time: 03/22/19  6:52 PM  Result Value Ref Range   WBC 9.4 4.0 - 10.5 K/uL   RBC 2.16 (L) 3.87 - 5.11 MIL/uL   Hemoglobin 6.4 (LL) 12.0 - 15.0 g/dL    Comment: REPEATED TO VERIFY THIS CRITICAL RESULT HAS VERIFIED AND BEEN CALLED TO LEONARD,C RN BY MARINDA BLACK ON 06 04 2020 AT 1930, AND HAS BEEN READ BACK. RBV CRITICAL HGB    HCT 19.9 (L) 36.0 - 46.0 %   MCV 92.1 80.0 - 100.0 fL   MCH 29.6  26.0 - 34.0 pg   MCHC 32.2 30.0 - 36.0 g/dL   RDW 18.6 (H) 11.5 - 15.5 %   Platelets 368 150 - 400 K/uL   nRBC 0.0 0.0 - 0.2 %   Neutrophils Relative % 76 %   Neutro Abs 7.1 1.7 - 7.7 K/uL   Lymphocytes Relative 14 %   Lymphs Abs 1.3 0.7 - 4.0 K/uL   Monocytes Relative 9 %   Monocytes Absolute 0.8 0.1 - 1.0 K/uL   Eosinophils Relative 1 %   Eosinophils Absolute 0.1 0.0 - 0.5 K/uL   Basophils Relative 0 %   Basophils Absolute 0.0 0.0 - 0.1 K/uL   Immature Granulocytes 0 %   Abs Immature Granulocytes 0.04 0.00 - 0.07 K/uL    Comment: Performed at [facility], [place] [address], [place], [place] [address]  Comprehensive metabolic panel     Status: Abnormal   Collection Time: 03/22/19  6:52 PM  Result Value Ref Range   Sodium 134 (L)  135 - 145 mmol/L   Potassium 4.6 3.5 - 5.1 mmol/L   Chloride 111 98 - 111 mmol/L   CO2 18 (L) 22 - 32 mmol/L   Glucose, Bld 153 (H) 70 - 99 mg/dL   BUN 35 (H) 8 - 23 mg/dL   Creatinine, Ser 1.73 (H) 0.44 - 1.00 mg/dL   Calcium 8.1 (L) 8.9 - 10.3 mg/dL   Total Protein 6.2 (L) 6.5 - 8.1 g/dL   Albumin 3.0 (L) 3.5 - 5.0 g/dL   AST 21 15 - 41 U/L   ALT 10 0 - 44 U/L   Alkaline Phosphatase 69 38 - 126 U/L   Total Bilirubin <0.1 (L) 0.3 - 1.2 mg/dL   GFR calc non Af Amer 28 (L) >60 mL/min   GFR calc Af Amer 33 (L) >60 mL/min   Anion gap 5 5 - 15    Comment: Performed at [facility], [place] [address] [place], [place] [address]  Troponin I - ONCE - STAT     Status: None   Collection Time: 03/22/19  6:52 PM  Result Value Ref Range   Troponin I <0.03 <0.03 ng/mL    Comment: Performed at [facility], [place] [address], [place], [place] [address]  ABO/Rh     Status: None   Collection Time: 03/22/19  8:30 PM  Result Value Ref Range   ABO/RH(D)      O POS Performed at [facility], [place] [address] [place], [place] [address]   Brain natriuretic peptide     Status: Abnormal   Collection Time: 03/22/19  9:28 PM  Result Value Ref Range   B Natriuretic Peptide 161.5 (H) 0.0 - 100.0 pg/mL    Comment: Performed at [facility], [place] [address] [place], [place] [address]  Vitamin B12     Status: None   Collection Time: 03/22/19  9:28 PM  Result Value Ref Range   Vitamin B-12 591 180 - 914 pg/mL    Comment: (NOTE) This assay is not validated for testing neonatal or myeloproliferative syndrome specimens for Vitamin B12 levels. Performed at [facility], [place] [address], [place], [place] [address]   Folate     Status: None   Collection Time: 03/22/19  9:28 PM  Result Value Ref Range   Folate 9.2 >5.9 ng/mL    Comment: Performed at [facility], [place] [address], [place], [place]  [address]  Iron and TIBC     Status: Abnormal   Collection Time: 03/22/19  9:28 PM  Result Value Ref Range   Iron 44 28 - 170 ug/dL   TIBC 219 (L) 250 - 450 ug/dL   Saturation Ratios 20 10.4 - 31.8 %   UIBC 175 ug/dL    Comment: Performed at [facility], [place] [address], [place], [place] [address]  Ferritin     Status: Abnormal   Collection Time: 03/22/19  9:28 PM  Result Value Ref Range   Ferritin 338 (H) 11 - 307 ng/mL    Comment: Performed at [facility], [place] [address] [place], [place] [address]  Reticulocytes     Status: Abnormal   Collection Time: 03/22/19  9:28 PM  Result Value Ref Range   Retic Ct Pct 5.0 (H) 0.4 - 3.1 %   RBC. 2.11 (L) 3.87 - 5.11 MIL/uL   Retic Count, Absolute 105.9 19.0 - 186.0 K/uL   Immature Retic Fract 29.9 (H) 2.3 - 15.9 %    Comment: Performed at [facility], [place] [address] [place], [place] [address]  Type and screen [place]     Status: None (Preliminary result)   Collection Time: 03/22/19  9:28 PM  Result Value Ref Range   ABO/RH(D) O POS    Antibody Screen NEG    Sample Expiration 03/25/2019,2359    Unit Number [id number]    Blood Component Type RBC LR PHER2    Unit division 00    Status of Unit ISSUED    Transfusion Status OK TO TRANSFUSE    Crossmatch Result      Compatible Performed at [facility], [place] [address] [place], [place] [address]   Prepare RBC     Status: None   Collection Time: 03/22/19  9:28 PM  Result Value Ref Range   Order Confirmation      ORDER PROCESSED BY BLOOD BANK Performed at [facility], [place] [address] [place],  [address]   Occult blood, poc device     Status: None   Collection Time: 03/22/19  9:32 PM  Result Value Ref Range   Fecal Occult Bld NEGATIVE NEGATIVE  SARS Coronavirus 2 (CEPHEID - Performed in [place] hospital lab), Hosp Order     Status: None   Collection Time:  03/22/19 11:14 PM  Result Value Ref Range   SARS Coronavirus 2 NEGATIVE NEGATIVE    Comment: (NOTE) If result is NEGATIVE SARS-CoV-2 target nucleic acids are NOT DETECTED. The SARS-CoV-2 RNA is generally detectable in upper and lower  respiratory specimens during the acute phase of infection. The lowest  concentration of SARS-CoV-2 viral copies this assay can detect is 250  copies / mL. A negative result does not preclude SARS-CoV-2 infection  and should not be used as the sole basis for treatment or other  patient management decisions.  A negative result may occur with  improper specimen collection / handling, submission of specimen other  than nasopharyngeal swab, presence of viral mutation(s) within the  areas targeted by this assay, and inadequate number of viral copies  (<250 copies / mL). A negative result must be combined with clinical  observations, patient history, and epidemiological information. If result is POSITIVE SARS-CoV-2 target nucleic acids are DETECTED. The SARS-CoV-2 RNA is generally detectable in upper and lower  respiratory specimens dur ing the acute phase of infection.  Positive  results are indicative of active  infection with SARS-CoV-2.  Clinical  correlation with patient history and other diagnostic information is  necessary to determine patient infection status.  Positive results do  not rule out bacterial infection or co-infection with other viruses. If result is PRESUMPTIVE POSTIVE SARS-CoV-2 nucleic acids MAY BE PRESENT.   A presumptive positive result was obtained on the submitted specimen  and confirmed on repeat testing.  While 2019 novel coronavirus  (SARS-CoV-2) nucleic acids may be present in the submitted sample  additional confirmatory testing may be necessary for epidemiological  and / or clinical management purposes  to differentiate between  SARS-CoV-2 and other Sarbecovirus currently known to infect humans.  If clinically indicated  additional testing with an alternate test  methodology [phone]) is advised. The SARS-CoV-2 RNA is generally  detectable in upper and lower respiratory sp ecimens during the acute  phase of infection. The expected result is Negative. Fact Sheet for Patients:  StrictlyIdeas.no Fact Sheet for Healthcare Providers: BankingDealers.co.za This test is not yet approved or cleared by the [place] FDA and has been authorized for detection and/or diagnosis of SARS-CoV-2 by FDA under an Emergency Use Authorization (EUA).  This EUA will remain in effect (meaning this test can be used) for the duration of the COVID-19 declaration under Section 564(b)(1) of the Act, 21 U.S.C. section 360bbb-3(b)(1), unless the authorization is terminated or revoked sooner. Performed at [facility], [place] [address] [place], [place] [address]    Dg Chest 2 View  Result Date: 03/22/2019 CLINICAL DATA:  Shortness of breath with anemia EXAM: CHEST - 2 VIEW COMPARISON:  03/08/2019 FINDINGS: The heart size is stable. Aortic calcifications are noted. There is some pleuroparenchymal scarring at the lung apices. A background of emphysematous changes is suspected. There is some blunting of the left costophrenic angle. There are old healed left-sided rib fractures. There is no evidence of acute osseous abnormality. There are advanced degenerative changes of the midthoracic spine. IMPRESSION: 1. Stable cardiac silhouette. 2. Interval development of a small left-sided pleural effusion. There is a new opacity at the left costophrenic angle which may represent atelectasis or infiltrate. 3. The lungs remain hyperexpanded which can be seen in patients with COPD. Electronically Signed   By: Constance Holster M.D.   On: 03/22/2019 19:29    Pending Labs Unresulted Labs (From admission, onward)    Start     Ordered   03/22/19 1730  Urinalysis, Routine w reflex  microscopic  Once,   R     03/22/19 1729   Signed and Held  Creatinine, serum  (enoxaparin (LOVENOX)    CrCl < 30 ml/min)  Weekly,   R    Comments:  while on enoxaparin therapy.    Signed and Held   Signed and Held  Basic metabolic panel  Tomorrow morning,   R     Signed and Held   Signed and Held  CBC  Tomorrow morning,   R     Signed and Held   Signed and Held  Iron and TIBC  Add-on,   R     Signed and Held          Vitals/Pain Today's Vitals   03/23/19 0030 03/23/19 0045 03/23/19 0100 03/23/19 0130  BP: (!) 141/81 135/83 136/87 127/83  Pulse: 72 71 68 72  Resp: 19 17 17 14   Temp:      TempSrc:      SpO2: 98% 100% 100% 100%  Weight:      Height:  PainSc:        Isolation Precautions No active isolations  Medications Medications  alum & mag hydroxide-simeth (MAALOX/MYLANTA) 200-200-20 MG/5ML suspension 30 mL (30 mLs Oral Given 03/22/19 2145)  0.9 %  sodium chloride infusion (Manually program via Guardrails IV Fluids) ( Intravenous New Bag/Given 03/22/19 2354)  traMADol (ULTRAM) tablet 50 mg (50 mg Oral Given 03/22/19 2304)    Mobility walks with device

## 2019-03-23 NOTE — Plan of Care (Signed)
  Problem: Education: Goal: Knowledge of General Education information will improve Description Including pain rating scale, medication(s)/side effects and non-pharmacologic comfort measures Outcome: Progressing   Problem: Health Behavior/Discharge Planning: Goal: Ability to manage health-related needs will improve Outcome: Progressing   Problem: Clinical Measurements: Goal: Ability to maintain clinical measurements within normal limits will improve Outcome: Progressing Goal: Will remain free from infection Outcome: Progressing Goal: Diagnostic test results will improve Outcome: Progressing Goal: Respiratory complications will improve Outcome: Progressing Goal: Cardiovascular complication will be avoided Outcome: Progressing   Problem: Activity: Goal: Risk for activity intolerance will decrease Outcome: Progressing   Problem: Nutrition: Goal: Adequate nutrition will be maintained Outcome: Progressing   Problem: Coping: Goal: Level of anxiety will decrease Outcome: Progressing   Problem: Elimination: Goal: Will not experience complications related to bowel motility Outcome: Progressing Goal: Will not experience complications related to urinary retention Outcome: Progressing   Problem: Pain Managment: Goal: General experience of comfort will improve Outcome: Progressing   Problem: Safety: Goal: Ability to remain free from injury will improve Outcome: Progressing   Problem: Skin Integrity: Goal: Risk for impaired skin integrity will decrease Outcome: Progressing   Problem: Education: Goal: Ability to identify signs and symptoms of gastrointestinal bleeding will improve Outcome: Progressing   Problem: Fluid Volume: Goal: Will show no signs and symptoms of excessive bleeding Outcome: Progressing   Problem: Clinical Measurements: Goal: Complications related to the disease process, condition or treatment will be avoided or minimized Outcome: Progressing

## 2019-03-23 NOTE — Progress Notes (Signed)
Patient ambulated out of the bed with walker 75 feet. Activity was tolerated  Well and will update provider.

## 2019-03-23 NOTE — Progress Notes (Signed)
PHARMACY NOTE: CITALOPRAM DOSING  On citalopram 40 mg daily as taken prior to admission.  Admission EKG 03/22/19:  QT 489 (borderline prolongation)  FDA Alert and prescribing information recommend limiting citalopram dosage to 20mg  daily when age > 60 due to increased risk of QTc prolongation and life-threatening arrhythmias.     Recommend:  Consider risk vs. benefit of continuing present citalopram dosage.  Clayburn Pert, PharmD, BCPS [phone] 03/23/2019  6:46 AM

## 2019-03-23 NOTE — Discharge Summary (Signed)
Physician Discharge Summary  Alyssa [name] SHF:[id number] DOB: 11-20-1940 DOA: 03/22/2019  PCP: Everardo Beals, NP  Admit date: 03/22/2019 Discharge date: 03/23/2019  Admitted From: Home Disposition:  Home  Discharge Condition:Stable CODE STATUS:DNR Diet recommendation: Heart Healthy  Brief/Interim Summary: HPI: Alyssa [name] is a 78 y.o. female with hx of HTN, HL, CKD 3, PAD sp fem-fem bypass graft 2013, hx back surgery, R THA, presented w/ SOB and generalized pain.  States SOB all the time, lasting "for a while".  No cough, chest pain or fevers.  No abd pain or n/v/d.  Voiding OK.  Saw PCP today and Hb was low in 6's.   Patient lives alone but has some body staying with her at this time.  Never married.  Past smoker, no etoh.  Has a walker and cane and wheelchair at home, uses the walker the most.  Denies any bloody or tarry stools. Has had sig leg swelling lately, doesn't take any fluid pills.   Hospital course: Hospital course remained stable.  She was admitted for blood transfusion.  Her anemia was most likely associated  with her chronic kidney disease.  She was also started on Lasix IV for her bilateral lower extremity edema. This morning she is hemodynamically stable.  Hemoglobin improved to the range of 8.4 after transfusion yesterday.  Bilateral lower extremity edema improved.  She will be continued on Lasix on discharge. She ambulated on the hallway and did very well.  She is stable for discharge today to home.  Following problems were addressed during her hospitalization:  1. Anemia - recurrent issues, hx of CKD stage 3b prob contributing.  On active bleeding. Guiac negative. Was here recently in May w/ Hb 8, now in 6's.  Fluid overloaded as well which may drive the Hb down. S/P transfusion with 1 unit of  PRBC.  Repeat hemoglobin this morning stable. 2. Vol overload - no CHF on CXR, sig leg edema due to fluid retention/ CKD. Started on lasix 40 IV bid .She will be  continued on Lasix 40 mg daily on discharge.Continue potassium supplementation. 3. DNR - discussed code status , pt "doesn't want to be on any machines", doesn't want CPR, etc.  DNR ordered.  4. CKD 3 - eGFR 33 5. PAD hx of fem-fem BPG in 2013 - cont meds asa/ plavix 6. HTN - bp's up , cont meds 7. Depression 8. Chronic pain Dispo - lives at home alone usually but has help.Ambulated on the hallway without any problem.   Discharge Diagnoses:  Principal Problem:   Symptomatic anemia Active Problems:   PVD (peripheral vascular disease) with claudication (HCC)   Severe recurrent major depression without psychotic features ([place])   Essential hypertension   Dyspnea on exertion   CKD (chronic kidney disease) stage 3, GFR 30-59 ml/min (HCC)   DNR (do not resuscitate)   Volume overload   Edema    Discharge Instructions  Discharge Instructions    Diet - low sodium heart healthy   Complete by:  As directed    Discharge instructions   Complete by:  As directed    1)Please follow-up with your PCP in a week. 2)Do CBC and BMP tests during the follow up with your PCP. 3) Take prescribed medication as instructed.   Increase activity slowly   Complete by:  As directed      Allergies as of 03/23/2019   No Known Allergies     Medication List    TAKE these medications  aspirin 81 MG EC tablet Take 1 tablet (81 mg total) by mouth daily.   busPIRone 15 MG tablet Commonly known as:  BUSPAR Take 15 mg by mouth 3 (three) times daily as needed for anxiety.   citalopram 20 MG tablet Commonly known as:  CeleXA Take 1 tablet (20 mg total) by mouth daily. What changed:    medication strength  how much to take   clonazePAM 1 MG tablet Commonly known as:  KLONOPIN Take 0.5 tablets (0.5 mg total) by mouth 2 (two) times daily as needed for anxiety. What changed:  when to take this   clopidogrel 75 MG tablet Commonly known as:  PLAVIX Take 1 tablet (75 mg total) by mouth daily.    donepezil 5 MG tablet Commonly known as:  ARICEPT Take 5 mg by mouth daily.   ergocalciferol 1.25 MG (50000 UT) capsule Commonly known as:  VITAMIN D2 Take 50,000 Units by mouth every Monday.   furosemide 40 MG tablet Commonly known as:  Lasix Take 1 tablet (40 mg total) by mouth daily for 30 days.   gabapentin 100 MG capsule Commonly known as:  NEURONTIN Take 1 capsule (100 mg total) by mouth 3 (three) times daily.   hydrALAZINE 25 MG tablet Commonly known as:  APRESOLINE Take 1 tablet (25 mg total) by mouth 3 (three) times daily.   Iron High-Potency 325 MG Tabs Take 325 mg by mouth daily with breakfast.   isosorbide mononitrate 60 MG 24 hr tablet Commonly known as:  IMDUR Take 1 tablet (60 mg total) by mouth daily.   isosorbide-hydrALAZINE 20-37.5 MG tablet Commonly known as:  BIDIL Take 2 tablets by mouth 3 (three) times daily.   magnesium oxide 400 (241.3 Mg) MG tablet Commonly known as:  MAG-OX Take 1 tablet (400 mg total) by mouth 2 (two) times daily.   megestrol 40 MG tablet Commonly known as:  MEGACE Take 40 mg by mouth 2 (two) times daily.   metoprolol tartrate 25 MG tablet Commonly known as:  LOPRESSOR Take 0.5 tablets (12.5 mg total) by mouth 2 (two) times daily.   nicotine 21 mg/24hr patch Commonly known as:  NICODERM CQ - dosed in mg/24 hours Place 1 patch (21 mg total) onto the skin daily.   Potassium Chloride ER 20 MEQ Tbcr Take 20 mEq by mouth daily for 30 days. What changed:    medication strength  how much to take   rosuvastatin 40 MG tablet Commonly known as:  CRESTOR Take 1 tablet (40 mg total) by mouth daily. What changed:  when to take this   traMADol 50 MG tablet Commonly known as:  ULTRAM Take 1 tablet (50 mg total) by mouth every 6 (six) hours as needed for moderate pain. What changed:    how much to take  when to take this  reasons to take this   valproic acid 250 MG capsule Commonly known as:  DEPAKENE Take 1  capsule (250 mg total) by mouth 2 (two) times daily.      Follow-up Information    Everardo Beals, NP. Schedule an appointment as soon as possible for a visit in 1 week(s).   Contact information: [place] [phone]          No Known Allergies  Consultations:  None   Procedures/Studies: Ct Abdomen Pelvis Wo Contrast  Result Date: 03/09/2019 CLINICAL DATA:  Unintentional weight loss.  Diffuse abdominal pain. EXAM: CT ABDOMEN AND PELVIS WITHOUT CONTRAST TECHNIQUE: Multidetector  CT imaging of the abdomen and pelvis was performed following the standard protocol without IV contrast. COMPARISON:  03/08/2019 renal ultrasound. Stone study of 11/18/2008 reviewed. FINDINGS: Lower chest: Mild bibasilar volume loss. Mild cardiomegaly with right coronary artery atherosclerosis. Trace bilateral pleural fluid or thickening. Moderate hiatal hernia. Hepatobiliary: Degraded exam secondary to lack of IV contrast, paucity of abdominopelvic fat, lumbar spine and right hip hardware artifact. Grossly normal noncontrast appearance of the liver and gallbladder. Common duct is mildly prominent for age, including at 10 mm on image 25/3. Pancreas: Pancreas is not well evaluated. Diffuse pancreatic atrophy, without definite duct dilatation or acute inflammation. Spleen: Normal in size, without focal abnormality. Adrenals/Urinary Tract: Normal adrenal glands. Moderate right renal atrophy. Left renal vascular calcifications. No hydronephrosis. No bladder calculi. Stomach/Bowel: The herniated stomach appears thick walled including on image 8/3. This area is underdistended and redundant. The left side of the colon is underdistended and not contrast opacified. Suspect concurrent moderate wall thickening, including on image 48/3. Diverticular identified throughout the colon. Grossly normal terminal ileum. The appendix may be identified including on image 39/3. Small bowel loops are  grossly unremarkable, but not well evaluated. Vascular/Lymphatic: Advanced aortic and branch vessel atherosclerosis. Status post fem/fem bypass. Limited evaluation for abdominopelvic adenopathy. Reproductive: Calcified uterine fibroids. Adnexa not well evaluated. Other: No gross free pelvic fluid or abdominal ascites. Musculoskeletal: Right hip arthroplasty. L4-5 trans pedicle screw fixation. Advanced lumbosacral spondylosis with mild convex left lumbar spine curvature. IMPRESSION: 1. Moderate to marked degradation, secondary to multiple factors detailed above. 2. Moderate hiatal hernia with apparent wall thickening within the herniated stomach. This could at least partially be due to underdistention. Given the clinical history of weight loss, endoscopy should be considered to exclude gastric neoplasm. 3. Suspicion of left-sided colitis, suboptimally evaluated. Consider infection or ischemia. 4. Uterine fibroids. 5. Coronary artery atherosclerosis. Aortic Atherosclerosis (ICD10-I70.0). 6. Mild common duct dilatation for age. Correlate with bilirubin level. If elevated, consider MRCP or ERCP. Electronically Signed   By: Abigail Miyamoto M.D.   On: 03/09/2019 17:31   Dg Chest 2 View  Result Date: 03/22/2019 CLINICAL DATA:  Shortness of breath with anemia EXAM: CHEST - 2 VIEW COMPARISON:  03/08/2019 FINDINGS: The heart size is stable. Aortic calcifications are noted. There is some pleuroparenchymal scarring at the lung apices. A background of emphysematous changes is suspected. There is some blunting of the left costophrenic angle. There are old healed left-sided rib fractures. There is no evidence of acute osseous abnormality. There are advanced degenerative changes of the midthoracic spine. IMPRESSION: 1. Stable cardiac silhouette. 2. Interval development of a small left-sided pleural effusion. There is a new opacity at the left costophrenic angle which may represent atelectasis or infiltrate. 3. The lungs remain  hyperexpanded which can be seen in patients with COPD. Electronically Signed   By: Constance Holster M.D.   On: 03/22/2019 19:29   US Renal  Result Date: 03/08/2019 CLINICAL DATA:  78 year old female acute renal failure. EXAM: RENAL / URINARY TRACT ULTRASOUND COMPLETE COMPARISON:  Lumbar spine CT 05/17/2016. FINDINGS: Right Kidney: Renal measurements: 6.9 x 2.5 x 3.6 centimeters = volume: 33 mL. Small echogenic right kidney (image 8). No hydronephrosis. Small simple appearing 8 millimeters cyst. Left Kidney: Renal measurements: 10.0 x 4.9 x 4.8 centimeters = volume: 22 mL. Echogenic left kidney (image 18). Mild asymmetric prominence of the left renal collecting system (image 27), but no overt hydronephrosis. Small 8 millimeter simple appearing cyst. Bladder: Appears normal for degree of bladder  distention. IMPRESSION: 1. Echogenic kidneys compatible with chronic medical renal disease. Asymmetric right renal atrophy. 2. Asymmetrically larger left renal collecting system, but no overt hydronephrosis. Electronically Signed   By: Genevie Ann M.D.   On: 03/08/2019 19:45   Dg Chest Port 1 View  Result Date: 03/08/2019 CLINICAL DATA:  Shortness of breath EXAM: PORTABLE CHEST 1 VIEW COMPARISON:  12/27/2018 FINDINGS: Heart is borderline in size. No confluent airspace opacities, effusions or edema. No acute bony abnormality. IMPRESSION: No active disease. Electronically Signed   By: Rolm Baptise M.D.   On: 03/08/2019 17:04       Subjective: Patient seen and examined the bedside this morning.  Comfortable.  Hemodynamically stable.  Hemoglobin stable this morning.  Denies any shortness of breath.  Lower extremity edema improved.  Stable for discharge.  Discharge Exam: Vitals:   03/23/19 0530 03/23/19 1026  BP: (!) 142/84 109/75  Pulse: 71 81  Resp: 16 16  Temp: 98.6 F (37 C) 99.3 F (37.4 C)  SpO2: 99% 98%   Vitals:   03/23/19 0211 03/23/19 0249 03/23/19 0530 03/23/19 1026  BP: (!) 143/85 (!) 150/84  (!) 142/84 109/75  Pulse: 65 66 71 81  Resp: _0 Temp: 97.7 F (36.5 C) 97.7 F (36.5 C) 98.6 F (37 C) 99.3 F (37.4 C)  TempSrc: Oral Oral Oral Oral  SpO2: 100% 100% 99% 98%  Weight:      Height:        General: Pt is alert, awake, not in acute distress Cardiovascular: RRR, S1/S2 +, no rubs, no gallops Respiratory: CTA bilaterally, no wheezing, no rhonchi Abdominal: Soft, NT, ND, bowel sounds + Extremities: 1-2+ bilateral lower extremity edema, no cyanosis    The results of significant diagnostics from this hospitalization (including imaging, microbiology, ancillary and laboratory) are listed below for reference.     Microbiology: Recent Results (from the past 240 hour(s))  SARS Coronavirus 2 (CEPHEID - Performed in [place] hospital lab), Hosp Order     Status: None   Collection Time: 03/22/19 11:14 PM  Result Value Ref Range Status   SARS Coronavirus 2 NEGATIVE NEGATIVE Final    Comment: (NOTE) If result is NEGATIVE SARS-CoV-2 target nucleic acids are NOT DETECTED. The SARS-CoV-2 RNA is generally detectable in upper and lower  respiratory specimens during the acute phase of infection. The lowest  concentration of SARS-CoV-2 viral copies this assay can detect is 250  copies / mL. A negative result does not preclude SARS-CoV-2 infection  and should not be used as the sole basis for treatment or other  patient management decisions.  A negative result may occur with  improper specimen collection / handling, submission of specimen other  than nasopharyngeal swab, presence of viral mutation(s) within the  areas targeted by this assay, and inadequate number of viral copies  (<250 copies / mL). A negative result must be combined with clinical  observations, patient history, and epidemiological information. If result is POSITIVE SARS-CoV-2 target nucleic acids are DETECTED. The SARS-CoV-2 RNA is generally detectable in upper and lower  respiratory specimens  dur ing the acute phase of infection.  Positive  results are indicative of active infection with SARS-CoV-2.  Clinical  correlation with patient history and other diagnostic information is  necessary to determine patient infection status.  Positive results do  not rule out bacterial infection or co-infection with other viruses. If result is PRESUMPTIVE POSTIVE SARS-CoV-2 nucleic acids MAY BE PRESENT.   A presumptive  positive result was obtained on the submitted specimen  and confirmed on repeat testing.  While 2019 novel coronavirus  (SARS-CoV-2) nucleic acids may be present in the submitted sample  additional confirmatory testing may be necessary for epidemiological  and / or clinical management purposes  to differentiate between  SARS-CoV-2 and other Sarbecovirus currently known to infect humans.  If clinically indicated additional testing with an alternate test  methodology [phone]) is advised. The SARS-CoV-2 RNA is generally  detectable in upper and lower respiratory sp ecimens during the acute  phase of infection. The expected result is Negative. Fact Sheet for Patients:  StrictlyIdeas.no Fact Sheet for Healthcare Providers: BankingDealers.co.za This test is not yet approved or cleared by the [place] FDA and has been authorized for detection and/or diagnosis of SARS-CoV-2 by FDA under an Emergency Use Authorization (EUA).  This EUA will remain in effect (meaning this test can be used) for the duration of the COVID-19 declaration under Section 564(b)(1) of the Act, 21 U.S.C. section 360bbb-3(b)(1), unless the authorization is terminated or revoked sooner. Performed at [facility], [place] [address], [place], [place] [address]      Labs: BNP (last 3 results) Recent Labs    12/10/18 0618 12/11/18 0424 03/22/19 2128  BNP 744.1* 471.1* 482.5*   Basic Metabolic Panel: Recent Labs  Lab 03/22/19 1852  03/23/19 0631  NA 134* 135  K 4.6 4.9  CL 111 113*  CO2 18* 18*  GLUCOSE 153* 86  BUN 35* 33*  CREATININE 1.73* 1.59*  CALCIUM 8.1* 7.9*   Liver Function Tests: Recent Labs  Lab 03/22/19 1852  AST 21  ALT 10  ALKPHOS 69  BILITOT <0.1*  PROT 6.2*  ALBUMIN 3.0*   No results for input(s): LIPASE, AMYLASE in the last 168 hours. No results for input(s): AMMONIA in the last 168 hours. CBC: Recent Labs  Lab 03/22/19 1852 03/23/19 0631  WBC 9.4 8.4  NEUTROABS 7.1  --   HGB 6.4* 8.4*  HCT 19.9* 26.0*  MCV 92.1 92.5  PLT 368 328   Cardiac Enzymes: Recent Labs  Lab 03/22/19 1852  TROPONINI <0.03   BNP: Invalid input(s): POCBNP CBG: No results for input(s): GLUCAP in the last 168 hours. D-Dimer No results for input(s): DDIMER in the last 72 hours. Hgb A1c No results for input(s): HGBA1C in the last 72 hours. Lipid Profile No results for input(s): CHOL, HDL, LDLCALC, TRIG, CHOLHDL, LDLDIRECT in the last 72 hours. Thyroid function studies No results for input(s): TSH, T4TOTAL, T3FREE, THYROIDAB in the last 72 hours.  Invalid input(s): FREET3 Anemia work up Recent Labs    03/22/19 2128  VITAMINB12 591  FOLATE 9.2  FERRITIN 338*  TIBC 219*  IRON 44  RETICCTPCT 5.0*   Urinalysis    Component Value Date/Time   COLORURINE YELLOW 03/23/2019 [place] 03/23/2019 0314   LABSPEC 1.011 03/23/2019 0314   PHURINE 6.0 03/23/2019 0314   GLUCOSEU NEGATIVE 03/23/2019 0314   HGBUR NEGATIVE 03/23/2019 0314   BILIRUBINUR NEGATIVE 03/23/2019 0314   KETONESUR NEGATIVE 03/23/2019 0314   PROTEINUR 30 (A) 03/23/2019 0314   UROBILINOGEN 0.2 08/24/2015 1737   NITRITE NEGATIVE 03/23/2019 0314   LEUKOCYTESUR TRACE (A) 03/23/2019 0314   Sepsis Labs Invalid input(s): PROCALCITONIN,  WBC,  LACTICIDVEN Microbiology Recent Results (from the past 240 hour(s))  SARS Coronavirus 2 (CEPHEID - Performed in [place] hospital lab), Hosp Order     Status: None    Collection Time: 03/22/19  11:14 PM  Result Value Ref Range Status   SARS Coronavirus 2 NEGATIVE NEGATIVE Final    Comment: (NOTE) If result is NEGATIVE SARS-CoV-2 target nucleic acids are NOT DETECTED. The SARS-CoV-2 RNA is generally detectable in upper and lower  respiratory specimens during the acute phase of infection. The lowest  concentration of SARS-CoV-2 viral copies this assay can detect is 250  copies / mL. A negative result does not preclude SARS-CoV-2 infection  and should not be used as the sole basis for treatment or other  patient management decisions.  A negative result may occur with  improper specimen collection / handling, submission of specimen other  than nasopharyngeal swab, presence of viral mutation(s) within the  areas targeted by this assay, and inadequate number of viral copies  (<250 copies / mL). A negative result must be combined with clinical  observations, patient history, and epidemiological information. If result is POSITIVE SARS-CoV-2 target nucleic acids are DETECTED. The SARS-CoV-2 RNA is generally detectable in upper and lower  respiratory specimens dur ing the acute phase of infection.  Positive  results are indicative of active infection with SARS-CoV-2.  Clinical  correlation with patient history and other diagnostic information is  necessary to determine patient infection status.  Positive results do  not rule out bacterial infection or co-infection with other viruses. If result is PRESUMPTIVE POSTIVE SARS-CoV-2 nucleic acids MAY BE PRESENT.   A presumptive positive result was obtained on the submitted specimen  and confirmed on repeat testing.  While 2019 novel coronavirus  (SARS-CoV-2) nucleic acids may be present in the submitted sample  additional confirmatory testing may be necessary for epidemiological  and / or clinical management purposes  to differentiate between  SARS-CoV-2 and other Sarbecovirus currently known to infect humans.   If clinically indicated additional testing with an alternate test  methodology [phone]) is advised. The SARS-CoV-2 RNA is generally  detectable in upper and lower respiratory sp ecimens during the acute  phase of infection. The expected result is Negative. Fact Sheet for Patients:  StrictlyIdeas.no Fact Sheet for Healthcare Providers: BankingDealers.co.za This test is not yet approved or cleared by the [place] FDA and has been authorized for detection and/or diagnosis of SARS-CoV-2 by FDA under an Emergency Use Authorization (EUA).  This EUA will remain in effect (meaning this test can be used) for the duration of the COVID-19 declaration under Section 564(b)(1) of the Act, 21 U.S.C. section 360bbb-3(b)(1), unless the authorization is terminated or revoked sooner. Performed at [facility], [place] [address], [place], [place] [address]     Please note: You were cared for by a hospitalist during your hospital stay. Once you are discharged, your primary care physician will handle any further medical issues. Please note that NO REFILLS for any discharge medications will be authorized once you are discharged, as it is imperative that you return to your primary care physician (or establish a relationship with a primary care physician if you do not have one) for your post hospital discharge needs so that they can reassess your need for medications and monitor your lab values.    Time coordinating discharge: 40 minutes  SIGNED:   Shelly Coss, MD  [facility] 03/23/2019, 1:18 PM Pager [phone]  If 7PM-7AM, please contact night-coverage [email or web address] Password [email or web address]

## 2019-03-23 NOTE — Progress Notes (Signed)
Patient A&Ox4, ambulatory 1 assist walker. Pt remains stable at time of discharge. Discharge instructions were reviewed via phone with pats son and with pt. Questions, concerns denied.

## 2019-04-11 ENCOUNTER — Other Ambulatory Visit: Payer: Self-pay

## 2019-04-11 ENCOUNTER — Emergency Department (HOSPITAL_COMMUNITY)
Admission: EM | Admit: 2019-04-11 | Discharge: 2019-04-11 | Disposition: A | Discharge status: Home / Self Care | Payer: Medicare HMO | Attending: Emergency Medicine | Admitting: Emergency Medicine

## 2019-04-11 DIAGNOSIS — I129 Hypertensive chronic kidney disease with stage 1 through stage 4 chronic kidney disease, or unspecified chronic kidney disease: Secondary | ICD-10-CM | POA: Insufficient documentation

## 2019-04-11 DIAGNOSIS — R55 Syncope and collapse: Secondary | ICD-10-CM | POA: Diagnosis not present

## 2019-04-11 DIAGNOSIS — Z87891 Personal history of nicotine dependence: Secondary | ICD-10-CM | POA: Diagnosis not present

## 2019-04-11 DIAGNOSIS — I252 Old myocardial infarction: Secondary | ICD-10-CM | POA: Diagnosis not present

## 2019-04-11 DIAGNOSIS — R109 Unspecified abdominal pain: Secondary | ICD-10-CM | POA: Diagnosis present

## 2019-04-11 DIAGNOSIS — Z7982 Long term (current) use of aspirin: Secondary | ICD-10-CM | POA: Diagnosis not present

## 2019-04-11 DIAGNOSIS — N184 Chronic kidney disease, stage 4 (severe): Secondary | ICD-10-CM | POA: Diagnosis not present

## 2019-04-11 DIAGNOSIS — I2510 Atherosclerotic heart disease of native coronary artery without angina pectoris: Secondary | ICD-10-CM | POA: Insufficient documentation

## 2019-04-11 DIAGNOSIS — Z79899 Other long term (current) drug therapy: Secondary | ICD-10-CM | POA: Diagnosis not present

## 2019-04-11 LAB — URINALYSIS, ROUTINE W REFLEX MICROSCOPIC
Bilirubin Urine: NEGATIVE
Glucose, UA: NEGATIVE mg/dL
Hgb urine dipstick: NEGATIVE
Ketones, ur: NEGATIVE mg/dL
Leukocytes,Ua: NEGATIVE
Nitrite: NEGATIVE
Protein, ur: NEGATIVE mg/dL
Specific Gravity, Urine: 1.008 (ref 1.005–1.030)
pH: 6 (ref 5.0–8.0)

## 2019-04-11 LAB — CBC WITH DIFFERENTIAL/PLATELET
Abs Immature Granulocytes: 0.04 10*3/uL (ref 0.00–0.07)
Basophils Absolute: 0.1 10*3/uL (ref 0.0–0.1)
Basophils Relative: 1 %
Eosinophils Absolute: 0.2 10*3/uL (ref 0.0–0.5)
Eosinophils Relative: 3 %
HCT: 24.4 % — ABNORMAL LOW (ref 36.0–46.0)
Hemoglobin: 7.8 g/dL — ABNORMAL LOW (ref 12.0–15.0)
Immature Granulocytes: 1 %
Lymphocytes Relative: 15 %
Lymphs Abs: 1 10*3/uL (ref 0.7–4.0)
MCH: 29.5 pg (ref 26.0–34.0)
MCHC: 32 g/dL (ref 30.0–36.0)
MCV: 92.4 fL (ref 80.0–100.0)
Monocytes Absolute: 0.4 10*3/uL (ref 0.1–1.0)
Monocytes Relative: 6 %
Neutro Abs: 5 10*3/uL (ref 1.7–7.7)
Neutrophils Relative %: 74 %
Platelets: 374 10*3/uL (ref 150–400)
RBC: 2.64 MIL/uL — ABNORMAL LOW (ref 3.87–5.11)
RDW: 17.7 % — ABNORMAL HIGH (ref 11.5–15.5)
WBC: 6.8 10*3/uL (ref 4.0–10.5)
nRBC: 0 % (ref 0.0–0.2)

## 2019-04-11 LAB — CBG MONITORING, ED: Glucose-Capillary: 143 mg/dL — ABNORMAL HIGH (ref 70–99)

## 2019-04-11 LAB — COMPREHENSIVE METABOLIC PANEL
ALT: 8 U/L (ref 0–44)
AST: 16 U/L (ref 15–41)
Albumin: 3.4 g/dL — ABNORMAL LOW (ref 3.5–5.0)
Alkaline Phosphatase: 46 U/L (ref 38–126)
Anion gap: 10 (ref 5–15)
BUN: 54 mg/dL — ABNORMAL HIGH (ref 8–23)
CO2: 14 mmol/L — ABNORMAL LOW (ref 22–32)
Calcium: 9.2 mg/dL (ref 8.9–10.3)
Chloride: 116 mmol/L — ABNORMAL HIGH (ref 98–111)
Creatinine, Ser: 2.07 mg/dL — ABNORMAL HIGH (ref 0.44–1.00)
GFR calc Af Amer: 26 mL/min — ABNORMAL LOW (ref >60)
GFR calc non Af Amer: 23 mL/min — ABNORMAL LOW (ref >60)
Glucose, Bld: 134 mg/dL — ABNORMAL HIGH (ref 70–99)
Potassium: 5.1 mmol/L (ref 3.5–5.1)
Sodium: 140 mmol/L (ref 135–145)
Total Bilirubin: 0.2 mg/dL — ABNORMAL LOW (ref 0.3–1.2)
Total Protein: 6.1 g/dL — ABNORMAL LOW (ref 6.5–8.1)

## 2019-04-11 NOTE — ED Notes (Signed)
Pt reporting some central chest pain at present time. MD made aware by this RN.

## 2019-04-11 NOTE — ED Notes (Signed)
Pt stated she uses a walker at home, and she ambulated with a walker here. She reported slight SOB and lightheadedness. Upon returning to bed, her SP O2 was 100.

## 2019-04-11 NOTE — ED Notes (Signed)
Pt given discharge instructions, follow up information and the opportunity to ask questions. Pt verbalized understanding. Pt discharged from the ED without incident.

## 2019-04-11 NOTE — ED Triage Notes (Signed)
Pt presenting with 9/10 abdominal pain. Pt reports she has had diarrhea x3 days. Per EMS patient stated she normally has some diarrhea but has been having more than normal over the last three days. Pt had a large BM this morning and had a syncopal episode following. Pt then had a near-syncopal episode in the shower and EMS was called. EMS reports patient had positive orthostatic blood pressures. 250 of NSS given to patient by EMS.

## 2019-04-15 NOTE — ED Provider Notes (Signed)
[facility] EMERGENCY DEPARTMENT Provider Note   CSN: [id number] Arrival date & time: 04/11/19  1044     History   Chief Complaint Chief Complaint  Patient presents with  . Abdominal Pain    HPI Alyssa [name] is a 78 y.o. female.     HPI   78 year old female with abdominal pain.  Diffuse.  She reports chronic diarrhea but states that she has had increased stooling over the past few days.  She was using the bathroom her this morning had a near syncopal event.  Got up to use the shower just right after and had similar symptoms.  Orthostatic for EMS.  Given 250 cc of fluids prior to arrival.  Denies any acute pain aside from the abdominal cramping.  Past Medical History:  Diagnosis Date  . Anxiety   . Arthritis   . Chest pain   . Chronic kidney disease   . Elevated troponin 12/13/2018  . Headache(784.0)   . Hyperlipidemia   . Hypertension   . Joint pain   . Leg pain   . Peripheral neuropathy   . Peripheral vascular disease [facility])     Patient Active Problem List   Diagnosis Date Noted  . DNR (do not resuscitate) 03/23/2019  . Volume overload 03/23/2019  . Edema 03/23/2019  . CKD (chronic kidney disease) stage 3, GFR 30-59 ml/min (HCC) 03/08/2019  . Acute renal failure (ARF) ([place]) 03/08/2019  . Hypokalemia 03/08/2019  . Palliative care by specialist   . Goals of care, counseling/discussion   . Altered mental status   . Encephalopathy, toxic 12/24/2018  . Aspiration pneumonia of both lower lobes due to gastric secretions ([place]) 12/24/2018  . Non-ST elevation (NSTEMI) myocardial infarction ([place])   . Chest pain   . Dyspnea on exertion   . Elevated troponin level 12/09/2018  . Chronic migraine 03/27/2018  . Lumbar radiculopathy 03/27/2018  . Gait abnormality 03/27/2018  . CKD (chronic kidney disease) stage 4, GFR 15-29 ml/min (HCC) 12/28/2016  . Symptomatic anemia 12/28/2016  . Chronic pain syndrome 12/28/2016  . Benzodiazepine withdrawal  ([place]) 12/28/2016  . Benzodiazepine withdrawal without complication ([place]) 66/44/0347  . Chronic right shoulder pain 12/06/2016  . Fall 09/17/2016  . Fracture of humeral head, closed, right, initial encounter 09/17/2016  . Rib fractures 09/17/2016  . Multiple falls 09/17/2016  . Severe muscle deconditioning 09/17/2016  . Failure to thrive in adult 09/17/2016  . Melena 04/25/2016  . Essential hypertension 04/25/2016  . Anxiety state 04/25/2016  . Tobacco abuse 04/25/2016  . Hyperlipidemia 04/25/2016  . Syncope 04/24/2016  . Syncope and collapse 04/24/2016  . Gait difficulty 01/12/2016  . Foot drop, left 01/12/2016  . Severe recurrent major depression without psychotic features ([place]) 08/28/2015  . Spondylolisthesis at L4-L5 level 07/30/2015  . PVD (peripheral vascular disease) with claudication ([place]) 07/29/2014  . Weakness-Bilateral arm/leg 07/29/2014  . Numbness-left leg 07/29/2014  . Swelling of limb-Legs 07/29/2014  . Atherosclerosis of native arteries of the extremities with intermittent claudication 09/30/2011    Past Surgical History:  Procedure Laterality Date  . ABDOMINAL ANGIOGRAM N/A 10/29/2011   Procedure: ABDOMINAL ANGIOGRAM;  Surgeon: Elam Dutch, MD;  Location: [facility] CATH LAB;  Service: Cardiovascular;  Laterality: N/A;  . BACK SURGERY    . FEMORAL-FEMORAL BYPASS GRAFT  08/31/2010  . JOINT REPLACEMENT Right 2012   Hip  . LOWER EXTREMITY ANGIOGRAPHY  06/14/2017   Procedure: Lower Extremity Angiography;  Surgeon: Adrian Prows, MD;  Location: [facility] CV  LAB;  Service: Cardiovascular;;  Bilateral limited angio performed  . RENAL ANGIOGRAPHY N/A 06/14/2017   Procedure: Renal Angiography;  Surgeon: Adrian Prows, MD;  Location: [facility] CV LAB;  Service: Cardiovascular;  Laterality: N/A;  . TOTAL HIP ARTHROPLASTY     right     OB History   No obstetric history on file.      Home Medications    Prior to Admission medications   Medication Sig Start Date End Date  Taking? Authorizing Provider  aspirin 81 MG EC tablet Take 1 tablet (81 mg total) by mouth daily. 01/09/19   Caren Griffins, MD  busPIRone (BUSPAR) 15 MG tablet Take 15 mg by mouth 3 (three) times daily as needed for anxiety. 02/28/19   [provider]  citalopram (CELEXA) 20 MG tablet Take 1 tablet (20 mg total) by mouth daily. 03/23/19 03/22/20  Shelly Coss, MD  clonazePAM (KLONOPIN) 1 MG tablet Take 0.5 tablets (0.5 mg total) by mouth 2 (two) times daily as needed for anxiety. Patient taking differently: Take 0.5 mg by mouth at bedtime.  01/09/19   Caren Griffins, MD  clopidogrel (PLAVIX) 75 MG tablet Take 1 tablet (75 mg total) by mouth daily. 01/09/19   Caren Griffins, MD  donepezil (ARICEPT) 5 MG tablet Take 5 mg by mouth daily.    [provider]  ergocalciferol (VITAMIN D2) 50000 units capsule Take 50,000 Units by mouth every Monday.     [provider]  Ferrous Sulfate (IRON HIGH-POTENCY) 325 MG TABS Take 325 mg by mouth daily with breakfast.    [provider]  furosemide (LASIX) 40 MG tablet Take 1 tablet (40 mg total) by mouth daily for 30 days. 03/23/19 04/22/19  Shelly Coss, MD  gabapentin (NEURONTIN) 100 MG capsule Take 1 capsule (100 mg total) by mouth 3 (three) times daily. 03/12/19   Pokhrel, Corrie Mckusick, MD  hydrALAZINE (APRESOLINE) 25 MG tablet Take 1 tablet (25 mg total) by mouth 3 (three) times daily. 01/09/19   Caren Griffins, MD  isosorbide mononitrate (IMDUR) 60 MG 24 hr tablet Take 1 tablet (60 mg total) by mouth daily. 01/09/19   Gherghe, Vella Redhead, MD  isosorbide-hydrALAZINE (BIDIL) 20-37.5 MG tablet Take 2 tablets by mouth 3 (three) times daily.    [provider]  magnesium oxide (MAG-OX) 400 (241.3 Mg) MG tablet Take 1 tablet (400 mg total) by mouth 2 (two) times daily. 03/12/19   Pokhrel, Corrie Mckusick, MD  megestrol (MEGACE) 40 MG tablet Take 40 mg by mouth 2 (two) times daily.    [provider]  metoprolol tartrate  (LOPRESSOR) 25 MG tablet Take 0.5 tablets (12.5 mg total) by mouth 2 (two) times daily. 03/12/19   Pokhrel, Corrie Mckusick, MD  nicotine (NICODERM CQ - DOSED IN MG/24 HOURS) 21 mg/24hr patch Place 1 patch (21 mg total) onto the skin daily. 03/13/19   Pokhrel, Corrie Mckusick, MD  potassium chloride 20 MEQ TBCR Take 20 mEq by mouth daily for 30 days. 03/23/19 04/22/19  Shelly Coss, MD  rosuvastatin (CRESTOR) 40 MG tablet Take 1 tablet (40 mg total) by mouth daily. Patient taking differently: Take 40 mg by mouth at bedtime.  01/09/19   Caren Griffins, MD  traMADol (ULTRAM) 50 MG tablet Take 1 tablet (50 mg total) by mouth every 6 (six) hours as needed for moderate pain. Patient taking differently: Take 25-50 mg by mouth every 8 (eight) hours as needed (for pain).  01/09/19   Caren Griffins, MD  valproic acid (DEPAKENE) 250 MG capsule Take 1 capsule (250 mg total) by mouth 2 (two) times daily. 01/09/19   Caren Griffins, MD    Family History Family History  Problem Relation Age of Onset  . Heart attack Mother   . Heart disease Mother   . Hyperlipidemia Mother   . Hypertension Mother   . Heart attack Father   . Heart disease Father        Before age 36  . Hyperlipidemia Father   . Hypertension Father   . Cancer Sister        brain tumor  . Aneurysm Brother        brain  . Colon cancer Neg Hx   . Liver cancer Neg Hx     Social History Social History   Tobacco Use  . Smoking status: Former Smoker    Packs/day: 0.25    Types: Cigarettes    Quit date: 11/22/2018    Years since quitting: 0.3  . Smokeless tobacco: Never Used  . Tobacco comment: 5 cigs a day   Substance Use Topics  . Alcohol use: No  . Drug use: No     Allergies   Patient has no known allergies.   Review of Systems Review of Systems  All systems reviewed and negative, other than as noted in HPI.  Physical Exam Updated Vital Signs BP 134/77   Pulse 85   Temp 98.1 F (36.7 C)   Resp 18   Ht 5\' 2"  (1.575 m)   Wt  44.5 kg   SpO2 100%   BMI 17.92 kg/m   Physical Exam Vitals signs and nursing note reviewed.  Constitutional:      General: She is not in acute distress.    Appearance: She is well-developed.  HENT:     Head: Normocephalic and atraumatic.  Eyes:     General:        Right eye: No discharge.        Left eye: No discharge.     Conjunctiva/sclera: Conjunctivae normal.  Neck:     Musculoskeletal: Neck supple.  Cardiovascular:     Rate and Rhythm: Normal rate and regular rhythm.     Heart sounds: Normal heart sounds. No murmur. No friction rub. No gallop.   Pulmonary:     Effort: Pulmonary effort is normal. No respiratory distress.     Breath sounds: Normal breath sounds.  Abdominal:     General: There is no distension.     Palpations: Abdomen is soft.     Tenderness: There is no abdominal tenderness.  Musculoskeletal:        General: No tenderness.  Skin:    General: Skin is warm and dry.  Neurological:     Mental Status: She is alert.  Psychiatric:        Behavior: Behavior normal.        Thought Content: Thought content normal.      ED Treatments / Results  Labs (all labs ordered are listed, but only abnormal results are displayed) Labs Reviewed  CBC WITH DIFFERENTIAL/PLATELET - Abnormal; Notable for the following components:      Result Value   RBC 2.64 (*)    Hemoglobin 7.8 (*)    HCT 24.4 (*)    RDW 17.7 (*)    All other components within normal limits  COMPREHENSIVE METABOLIC PANEL - Abnormal; Notable for the following components:   Chloride 116 (*)    CO2 14 (*)  Glucose, Bld 134 (*)    BUN 54 (*)    Creatinine, Ser 2.07 (*)    Total Protein 6.1 (*)    Albumin 3.4 (*)    Total Bilirubin 0.2 (*)    GFR calc non Af Amer 23 (*)    GFR calc Af Amer 26 (*)    All other components within normal limits  URINALYSIS, ROUTINE W REFLEX MICROSCOPIC - Abnormal; Notable for the following components:   Color, Urine STRAW (*)    All other components within  normal limits  CBG MONITORING, ED - Abnormal; Notable for the following components:   Glucose-Capillary 143 (*)    All other components within normal limits    EKG EKG Interpretation  Date/Time:  Wednesday April 11 2019 12:47:13 EDT Ventricular Rate:  75 PR Interval:  152 QRS Duration: 70 QT Interval:  422 QTC Calculation: 471 R Axis:   62 Text Interpretation:  Normal sinus rhythm Nonspecific T wave abnormality Abnormal ECG Confirmed by Virgel Manifold [phone]) on 04/11/2019 1:25:59 PM Also confirmed by Virgel Manifold [phone])  on 04/11/2019 1:36:59 PM   Radiology No results found.  Procedures Procedures (including critical care time)  Medications Ordered in ED Medications - No data to display   Initial Impression / Assessment and Plan / ED Course  I have reviewed the triage vital signs and the nursing notes.  Pertinent labs & imaging results that were available during my care of the patient were reviewed by me and considered in my medical decision making (see chart for details).   78 year old female with near syncope, possible brief syncopal event.  This was in the setting of having a bowel movement.  Now feeling better.  Some abdominal pain but her abdominal exam is benign.  Labs appear to be close to her baseline.  She received some fluids in the emergency room and her blood pressure is stable.  She is feeling better.  Ambulated steadily.  I feel she is appropriate discharge at this time.  Outpatient follow-up.  Final Clinical Impressions(s) / ED Diagnoses   Final diagnoses:  Syncope, unspecified syncope type    ED Discharge Orders    None       Virgel Manifold, MD 04/15/19 [phone]

## 2019-05-10 IMAGING — US US ABDOMEN LIMITED
1 series · 14 of 25 positions shown · non-contrast
Comparison: None.

CLINICAL DATA: Hepatomegaly

EXAM:
ULTRASOUND ABDOMEN LIMITED RIGHT UPPER QUADRANT

[Series 1: us abdomen limited · 0.18mm/px · 14 of 35 slices shown]
[im 1/35]
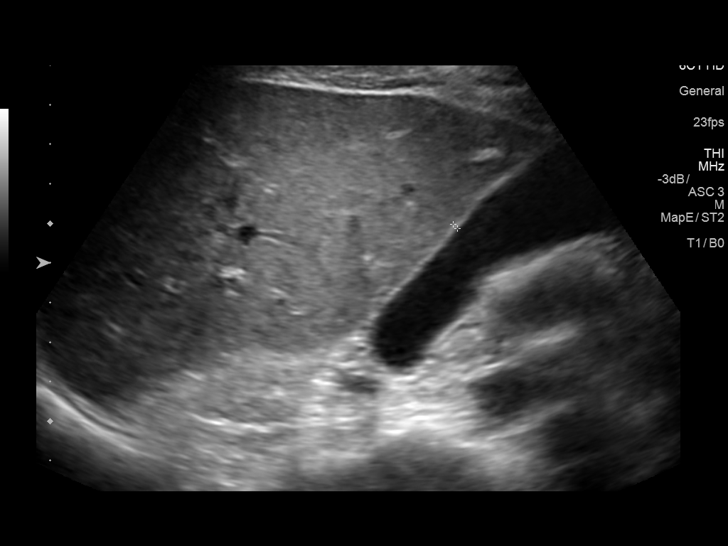
[im 3/35]
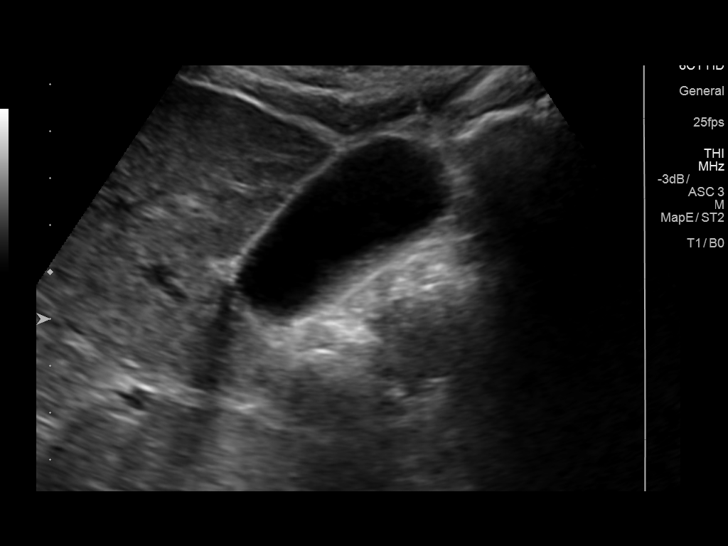
[im 6/35]
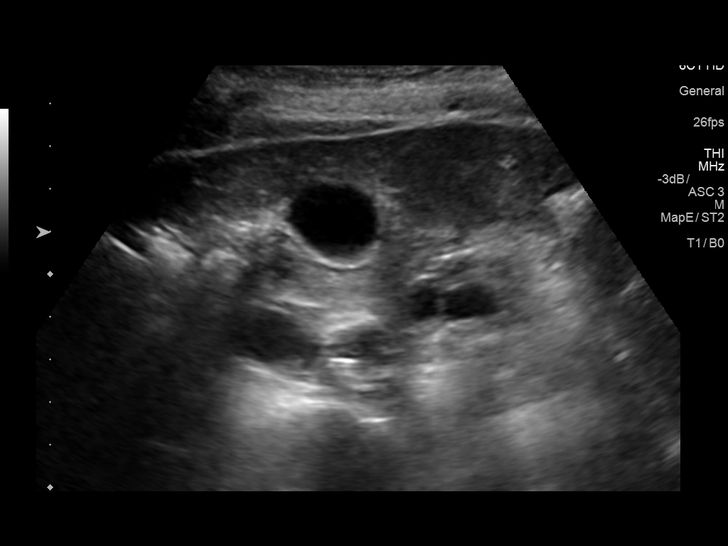
[im 9/35]
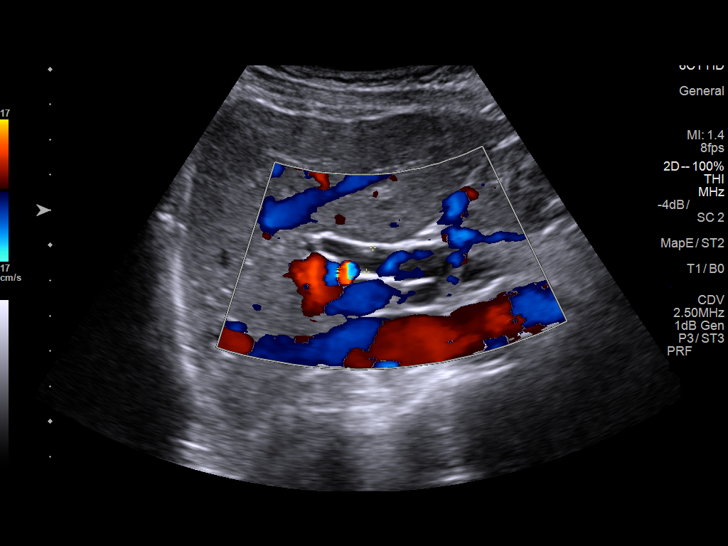
[im 12/35]
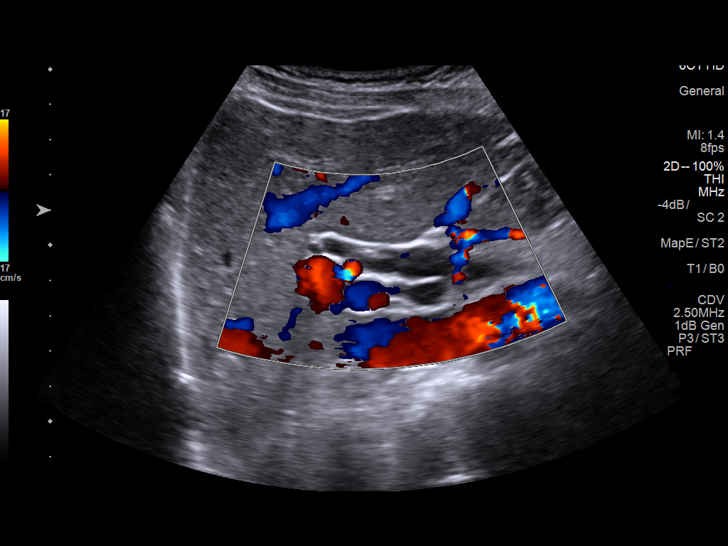
[im 13/35]
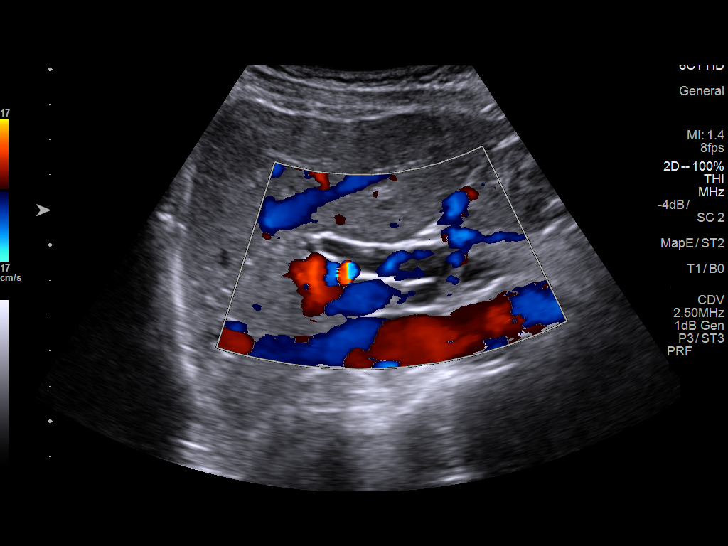
[im 16/35]
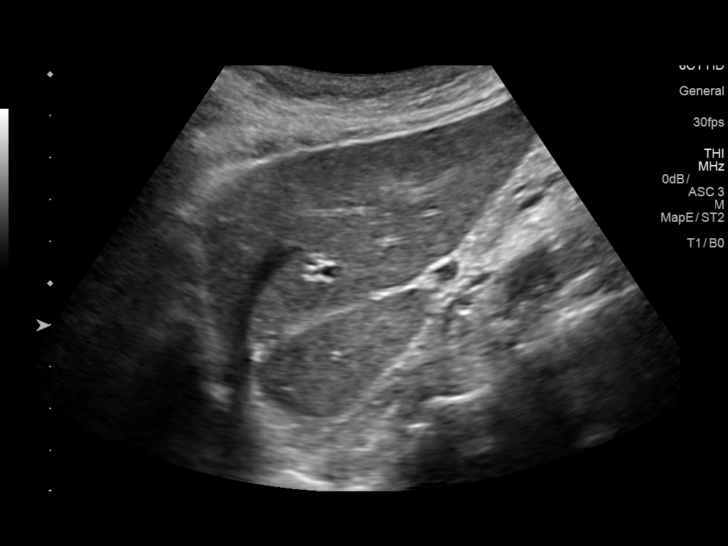
[im 19/35]
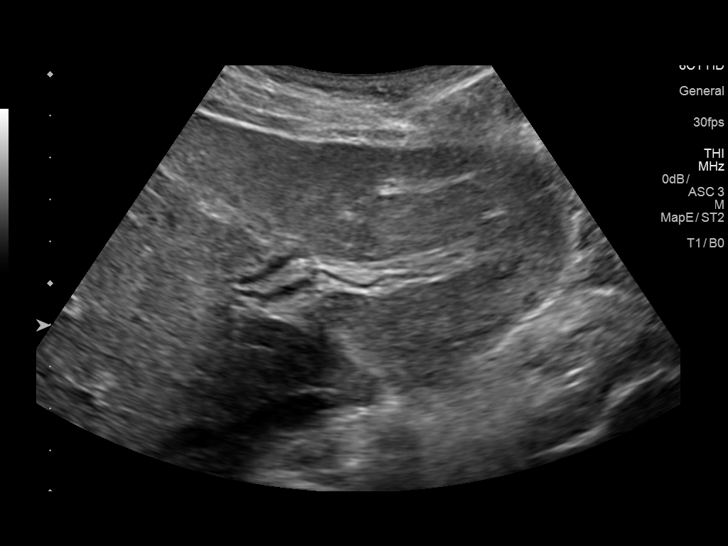
[im 22/35]
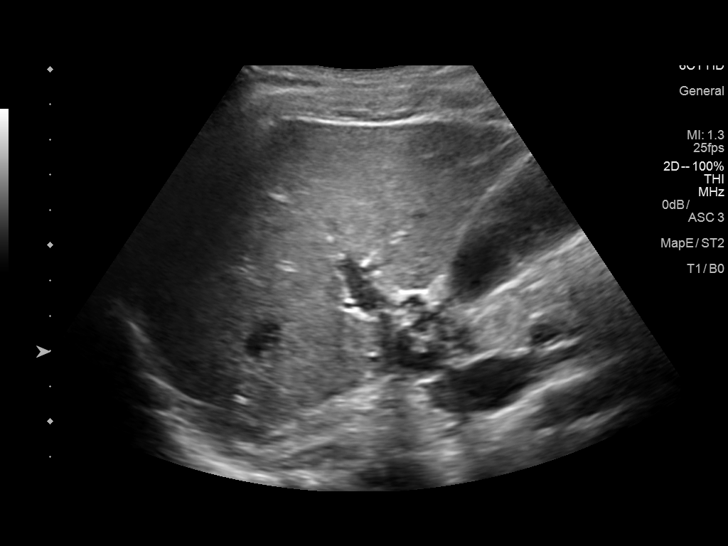
[im 23/35]
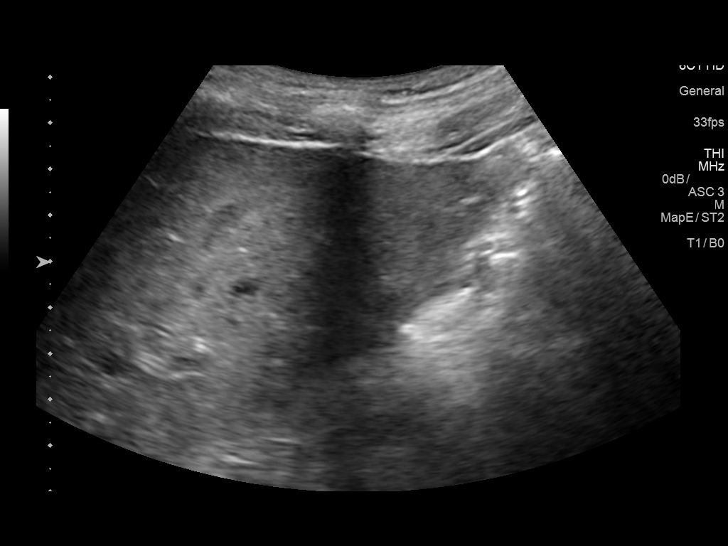
[im 26/35]
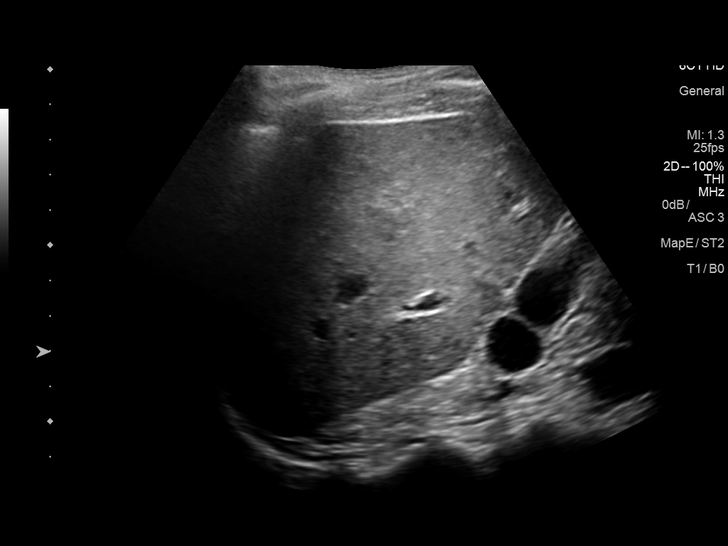
[im 29/35]
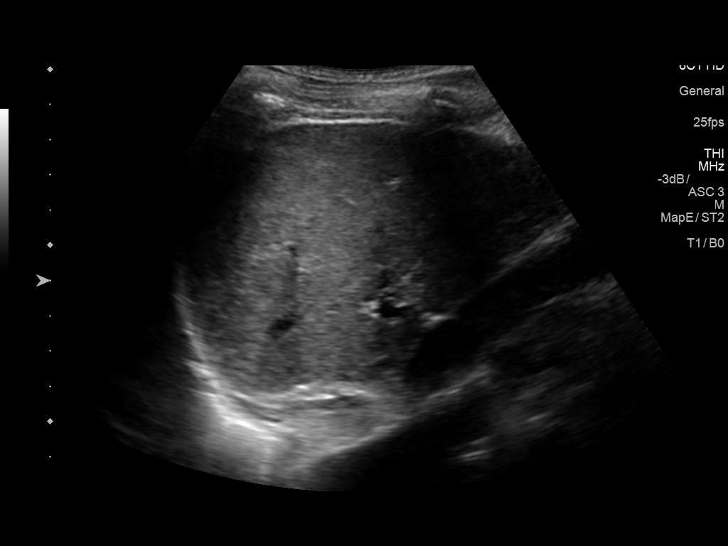
[im 32/35]
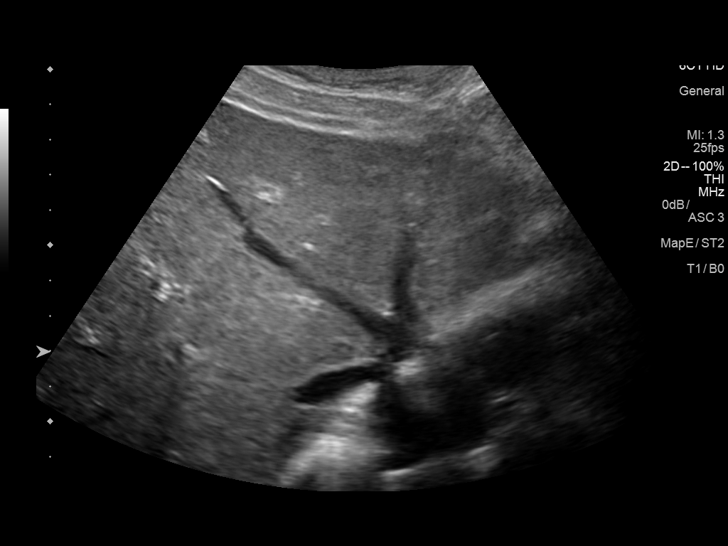
[im 35/35]
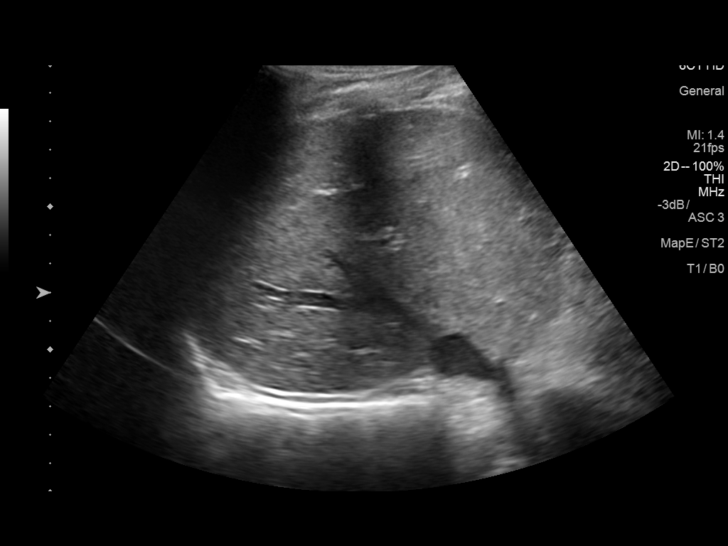

[14 of 25 positions shown; findings below may reference images not displayed]

FINDINGS: Gallbladder:

No gallstones or wall thickening visualized. No sonographic Murphy
sign noted by sonographer.

Common bile duct:

Diameter: Mildly dilated measuring up to 9 mm. Cannot visualize the
distal duct due to overlying bowel gas.

Liver:

Mildly increased echotexture suggesting fatty infiltration. No focal
abnormality or biliary duct dilatation. Portal vein is patent with
flow in the normal, hepatopetal direction.
IMPRESSION: Mild dilatation of the common bile duct. The distal duct cannot be
visualized. Recommend correlation with LFTs. This could be further
evaluated with MRCP or ERCP to exclude distal obstructing process.

Suspect mild fatty infiltration of the liver.

## 2019-05-16 ENCOUNTER — Other Ambulatory Visit: Payer: Self-pay

## 2019-05-16 ENCOUNTER — Ambulatory Visit (INDEPENDENT_AMBULATORY_CARE_PROVIDER_SITE_OTHER): Payer: Medicare HMO | Admitting: Cardiology

## 2019-05-16 DIAGNOSIS — I214 Non-ST elevation (NSTEMI) myocardial infarction: Secondary | ICD-10-CM

## 2019-05-16 NOTE — Progress Notes (Signed)
Patient had temp 100. 9 F with multiple questions positive for COVID. Not in distress. Recommended COVID testing/ER visit with worsening symptoms.

## 2019-06-06 ENCOUNTER — Encounter (HOSPITAL_COMMUNITY): Payer: Self-pay | Admitting: Emergency Medicine

## 2019-06-06 ENCOUNTER — Ambulatory Visit (INDEPENDENT_AMBULATORY_CARE_PROVIDER_SITE_OTHER)
Admission: EM | Admit: 2019-06-06 | Discharge: 2019-06-06 | Disposition: A | Discharge status: Home / Self Care | Payer: Medicare HMO | Source: Home / Self Care | Attending: Emergency Medicine | Admitting: Emergency Medicine

## 2019-06-06 ENCOUNTER — Inpatient Hospital Stay (HOSPITAL_COMMUNITY)
Admission: EM | Admit: 2019-06-06 | Discharge: 2019-06-16 | DRG: 392 | Disposition: A | Discharge status: Home / Self Care | Payer: Medicare HMO | Attending: Internal Medicine | Admitting: Internal Medicine

## 2019-06-06 ENCOUNTER — Other Ambulatory Visit: Payer: Self-pay

## 2019-06-06 DIAGNOSIS — K219 Gastro-esophageal reflux disease without esophagitis: Secondary | ICD-10-CM | POA: Diagnosis present

## 2019-06-06 DIAGNOSIS — Z7902 Long term (current) use of antithrombotics/antiplatelets: Secondary | ICD-10-CM

## 2019-06-06 DIAGNOSIS — I2510 Atherosclerotic heart disease of native coronary artery without angina pectoris: Secondary | ICD-10-CM | POA: Diagnosis present

## 2019-06-06 DIAGNOSIS — I739 Peripheral vascular disease, unspecified: Secondary | ICD-10-CM | POA: Diagnosis present

## 2019-06-06 DIAGNOSIS — K5792 Diverticulitis of intestine, part unspecified, without perforation or abscess without bleeding: Secondary | ICD-10-CM | POA: Diagnosis present

## 2019-06-06 DIAGNOSIS — D631 Anemia in chronic kidney disease: Secondary | ICD-10-CM | POA: Diagnosis present

## 2019-06-06 DIAGNOSIS — R933 Abnormal findings on diagnostic imaging of other parts of digestive tract: Secondary | ICD-10-CM

## 2019-06-06 DIAGNOSIS — E871 Hypo-osmolality and hyponatremia: Secondary | ICD-10-CM | POA: Diagnosis present

## 2019-06-06 DIAGNOSIS — I130 Hypertensive heart and chronic kidney disease with heart failure and stage 1 through stage 4 chronic kidney disease, or unspecified chronic kidney disease: Secondary | ICD-10-CM | POA: Diagnosis present

## 2019-06-06 DIAGNOSIS — R1032 Left lower quadrant pain: Secondary | ICD-10-CM

## 2019-06-06 DIAGNOSIS — Z87891 Personal history of nicotine dependence: Secondary | ICD-10-CM

## 2019-06-06 DIAGNOSIS — N184 Chronic kidney disease, stage 4 (severe): Secondary | ICD-10-CM | POA: Diagnosis present

## 2019-06-06 DIAGNOSIS — Z20828 Contact with and (suspected) exposure to other viral communicable diseases: Secondary | ICD-10-CM | POA: Diagnosis present

## 2019-06-06 DIAGNOSIS — K5732 Diverticulitis of large intestine without perforation or abscess without bleeding: Principal | ICD-10-CM | POA: Diagnosis present

## 2019-06-06 DIAGNOSIS — E872 Acidosis: Secondary | ICD-10-CM | POA: Diagnosis present

## 2019-06-06 DIAGNOSIS — Z79899 Other long term (current) drug therapy: Secondary | ICD-10-CM

## 2019-06-06 DIAGNOSIS — G894 Chronic pain syndrome: Secondary | ICD-10-CM | POA: Diagnosis present

## 2019-06-06 DIAGNOSIS — K2270 Barrett's esophagus without dysplasia: Secondary | ICD-10-CM | POA: Diagnosis present

## 2019-06-06 DIAGNOSIS — F418 Other specified anxiety disorders: Secondary | ICD-10-CM | POA: Diagnosis present

## 2019-06-06 DIAGNOSIS — D509 Iron deficiency anemia, unspecified: Secondary | ICD-10-CM | POA: Diagnosis present

## 2019-06-06 DIAGNOSIS — E860 Dehydration: Secondary | ICD-10-CM

## 2019-06-06 DIAGNOSIS — I959 Hypotension, unspecified: Secondary | ICD-10-CM | POA: Diagnosis present

## 2019-06-06 DIAGNOSIS — F0390 Unspecified dementia without behavioral disturbance: Secondary | ICD-10-CM | POA: Diagnosis present

## 2019-06-06 DIAGNOSIS — I10 Essential (primary) hypertension: Secondary | ICD-10-CM | POA: Diagnosis present

## 2019-06-06 DIAGNOSIS — E875 Hyperkalemia: Secondary | ICD-10-CM | POA: Diagnosis present

## 2019-06-06 DIAGNOSIS — N179 Acute kidney failure, unspecified: Secondary | ICD-10-CM

## 2019-06-06 DIAGNOSIS — K5909 Other constipation: Secondary | ICD-10-CM | POA: Diagnosis present

## 2019-06-06 DIAGNOSIS — Z66 Do not resuscitate: Secondary | ICD-10-CM | POA: Diagnosis present

## 2019-06-06 DIAGNOSIS — R197 Diarrhea, unspecified: Secondary | ICD-10-CM

## 2019-06-06 DIAGNOSIS — Z7982 Long term (current) use of aspirin: Secondary | ICD-10-CM

## 2019-06-06 DIAGNOSIS — E785 Hyperlipidemia, unspecified: Secondary | ICD-10-CM | POA: Diagnosis present

## 2019-06-06 DIAGNOSIS — K449 Diaphragmatic hernia without obstruction or gangrene: Secondary | ICD-10-CM | POA: Diagnosis present

## 2019-06-06 LAB — URINALYSIS, ROUTINE W REFLEX MICROSCOPIC
Bacteria, UA: NONE SEEN
Bilirubin Urine: NEGATIVE
Glucose, UA: NEGATIVE mg/dL
Hgb urine dipstick: NEGATIVE
Ketones, ur: NEGATIVE mg/dL
Leukocytes,Ua: NEGATIVE
Nitrite: NEGATIVE
Protein, ur: 100 mg/dL — AB
Specific Gravity, Urine: 1.013 (ref 1.005–1.030)
pH: 5 (ref 5.0–8.0)

## 2019-06-06 LAB — COMPREHENSIVE METABOLIC PANEL
ALT: 13 U/L (ref 0–44)
AST: 18 U/L (ref 15–41)
Albumin: 3.9 g/dL (ref 3.5–5.0)
Alkaline Phosphatase: 86 U/L (ref 38–126)
Anion gap: 12 (ref 5–15)
BUN: 46 mg/dL — ABNORMAL HIGH (ref 8–23)
CO2: 9 mmol/L — ABNORMAL LOW (ref 22–32)
Calcium: 10.1 mg/dL (ref 8.9–10.3)
Chloride: 114 mmol/L — ABNORMAL HIGH (ref 98–111)
Creatinine, Ser: 2.12 mg/dL — ABNORMAL HIGH (ref 0.44–1.00)
GFR calc Af Amer: 26 mL/min — ABNORMAL LOW (ref >60)
GFR calc non Af Amer: 22 mL/min — ABNORMAL LOW (ref >60)
Glucose, Bld: 106 mg/dL — ABNORMAL HIGH (ref 70–99)
Potassium: 5.3 mmol/L — ABNORMAL HIGH (ref 3.5–5.1)
Sodium: 135 mmol/L (ref 135–145)
Total Bilirubin: 0.5 mg/dL (ref 0.3–1.2)
Total Protein: 7.8 g/dL (ref 6.5–8.1)

## 2019-06-06 LAB — CBC
HCT: 31.4 % — ABNORMAL LOW (ref 36.0–46.0)
Hemoglobin: 10 g/dL — ABNORMAL LOW (ref 12.0–15.0)
MCH: 29.4 pg (ref 26.0–34.0)
MCHC: 31.8 g/dL (ref 30.0–36.0)
MCV: 92.4 fL (ref 80.0–100.0)
Platelets: 563 10*3/uL — ABNORMAL HIGH (ref 150–400)
RBC: 3.4 MIL/uL — ABNORMAL LOW (ref 3.87–5.11)
RDW: 13.9 % (ref 11.5–15.5)
WBC: 12.8 10*3/uL — ABNORMAL HIGH (ref 4.0–10.5)
nRBC: 0 % (ref 0.0–0.2)

## 2019-06-06 LAB — LIPASE, BLOOD: Lipase: 32 U/L (ref 11–51)

## 2019-06-06 MED ORDER — SODIUM CHLORIDE 0.9% FLUSH: 3.0000 mL | Freq: Once | INTRAVENOUS | Status: DC

## 2019-06-06 NOTE — ED Triage Notes (Signed)
Left hip and left abdominal pain that started 2 days ago.  Pain worsened today.  Last bm was today and normal per patient.  Passing BM did not help pain.    No diarrhea, no nausea, no vomiting.  " it just hurts"

## 2019-06-06 NOTE — ED Triage Notes (Signed)
Pt c/o left hip pain that started yesterday. Denies fall/trauma. Also c/o LLQ pain , denies nausea/vomiting/diarrhea, that started yesterday as well. Sent from urgent care for further evaluation. Hypertensive in triage, hx of the same.

## 2019-06-06 NOTE — ED Provider Notes (Signed)
HPI  SUBJECTIVE:  Alyssa [name] is a 78 y.o. female who presents with sharp nonmigratory, nonradiating left lower quadrant pain starting yesterday.  States that it has become constant today and it is getting worse.  Reports anorexia. last meal was at lunch today.  Had a normal bowel movement today without improvement in her symptoms.  No nausea, vomiting, fevers, abdominal distention.  Denies melena, hematochezia, new or different back pain.  No dysuria, urgency, frequency, cloudy or odorous urine, hematuria.  She states that the car ride over here was painful.  She has never had symptoms like this before.  She has not tried anything for this.  Symptoms are better with sitting still, worse with movement, sitting up straight and walking.  Is not associated with eating or drinking.  She has a past medical history including NSTEMI, CHF, chronic kidney disease stage IV, anemia, hypertension, peripheral vascular disease, coronary artery disease, nephrolithiasis.  No history of abdominal surgeries, diabetes, diverticulitis, diverticulosis, pancreatitis, alcohol use, atrial fibrillation, mesenteric ischemia, UTI, pyelonephritis, opiate use. PMD: Everardo Beals, NP   Past Medical History:  Diagnosis Date  . Anxiety   . Arthritis   . Chest pain   . Chronic kidney disease   . Elevated troponin 12/13/2018  . Headache(784.0)   . Hyperlipidemia   . Hypertension   . Joint pain   . Leg pain   . Peripheral neuropathy   . Peripheral vascular disease [facility])     Past Surgical History:  Procedure Laterality Date  . ABDOMINAL ANGIOGRAM N/A 10/29/2011   Procedure: ABDOMINAL ANGIOGRAM;  Surgeon: Elam Dutch, MD;  Location: [facility] CATH LAB;  Service: Cardiovascular;  Laterality: N/A;  . BACK SURGERY    . FEMORAL-FEMORAL BYPASS GRAFT  08/31/2010  . JOINT REPLACEMENT Right 2012   Hip  . LOWER EXTREMITY ANGIOGRAPHY  06/14/2017   Procedure: Lower Extremity Angiography;  Surgeon: Adrian Prows, MD;   Location: [facility] CV LAB;  Service: Cardiovascular;;  Bilateral limited angio performed  . RENAL ANGIOGRAPHY N/A 06/14/2017   Procedure: Renal Angiography;  Surgeon: Adrian Prows, MD;  Location: [place] CV LAB;  Service: Cardiovascular;  Laterality: N/A;  . TOTAL HIP ARTHROPLASTY     right    Family History  Problem Relation Age of Onset  . Heart attack Mother   . Heart disease Mother   . Hyperlipidemia Mother   . Hypertension Mother   . Heart attack Father   . Heart disease Father        Before age 27  . Hyperlipidemia Father   . Hypertension Father   . Cancer Sister        brain tumor  . Aneurysm Brother        brain  . Colon cancer Neg Hx   . Liver cancer Neg Hx     Social History   Tobacco Use  . Smoking status: Former Smoker    Packs/day: 0.25    Types: Cigarettes    Quit date: 11/22/2018    Years since quitting: 0.5  . Smokeless tobacco: Never Used  . Tobacco comment: 5 cigs a day   Substance Use Topics  . Alcohol use: No  . Drug use: No    No current facility-administered medications for this encounter.   Current Outpatient Medications:  .  aspirin 81 MG EC tablet, Take 1 tablet (81 mg total) by mouth daily., Disp: 30 tablet, Rfl: 1 .  busPIRone (BUSPAR) 15 MG tablet, Take 15 mg by mouth 3 (  three) times daily as needed for anxiety., Disp: , Rfl:  .  citalopram (CELEXA) 20 MG tablet, Take 1 tablet (20 mg total) by mouth daily., Disp: 30 tablet, Rfl: 1 .  clonazePAM (KLONOPIN) 1 MG tablet, Take 0.5 tablets (0.5 mg total) by mouth 2 (two) times daily as needed for anxiety. (Patient taking differently: Take 0.5 mg by mouth at bedtime. ), Disp: 20 tablet, Rfl: 0 .  clopidogrel (PLAVIX) 75 MG tablet, Take 1 tablet (75 mg total) by mouth daily., Disp: 30 tablet, Rfl: 1 .  donepezil (ARICEPT) 5 MG tablet, Take 5 mg by mouth daily., Disp: , Rfl:  .  ergocalciferol (VITAMIN D2) 50000 units capsule, Take 50,000 Units by mouth every Monday. , Disp: , Rfl:  .  Ferrous  Sulfate (IRON HIGH-POTENCY) 325 MG TABS, Take 325 mg by mouth daily with breakfast., Disp: , Rfl:  .  furosemide (LASIX) 40 MG tablet, Take 1 tablet (40 mg total) by mouth daily for 30 days., Disp: 30 tablet, Rfl: 0 .  gabapentin (NEURONTIN) 100 MG capsule, Take 1 capsule (100 mg total) by mouth 3 (three) times daily., Disp: 90 capsule, Rfl: 2 .  hydrALAZINE (APRESOLINE) 25 MG tablet, Take 1 tablet (25 mg total) by mouth 3 (three) times daily., Disp: 90 tablet, Rfl: 1 .  isosorbide mononitrate (IMDUR) 60 MG 24 hr tablet, Take 1 tablet (60 mg total) by mouth daily., Disp: 30 tablet, Rfl: 1 .  isosorbide-hydrALAZINE (BIDIL) 20-37.5 MG tablet, Take 2 tablets by mouth 3 (three) times daily., Disp: , Rfl:  .  magnesium oxide (MAG-OX) 400 (241.3 Mg) MG tablet, Take 1 tablet (400 mg total) by mouth 2 (two) times daily., Disp: 30 tablet, Rfl: 0 .  megestrol (MEGACE) 40 MG tablet, Take 40 mg by mouth 2 (two) times daily., Disp: , Rfl:  .  metoprolol tartrate (LOPRESSOR) 25 MG tablet, Take 0.5 tablets (12.5 mg total) by mouth 2 (two) times daily., Disp: , Rfl:  .  nicotine (NICODERM CQ - DOSED IN MG/24 HOURS) 21 mg/24hr patch, Place 1 patch (21 mg total) onto the skin daily., Disp: 28 patch, Rfl: 0 .  potassium chloride 20 MEQ TBCR, Take 20 mEq by mouth daily for 30 days., Disp: 30 tablet, Rfl: 0 .  rosuvastatin (CRESTOR) 40 MG tablet, Take 1 tablet (40 mg total) by mouth daily. (Patient taking differently: Take 40 mg by mouth at bedtime. ), Disp: 30 tablet, Rfl: 1 .  traMADol (ULTRAM) 50 MG tablet, Take 1 tablet (50 mg total) by mouth every 6 (six) hours as needed for moderate pain. (Patient taking differently: Take 25-50 mg by mouth every 8 (eight) hours as needed (for pain). ), Disp: 12 tablet, Rfl: 0 .  valproic acid (DEPAKENE) 250 MG capsule, Take 1 capsule (250 mg total) by mouth 2 (two) times daily., Disp: 60 capsule, Rfl: 0  No Known Allergies   ROS  As noted in HPI.   Physical Exam  BP (!)  175/118 (BP Location: Left Arm) Comment: repositioned patient  Pulse (!) 106   Temp 98 F (36.7 C)   Resp (!) 22   SpO2 100%   Constitutional: Well developed, well nourished, appears to be in moderate painful distress Eyes:  EOMI, conjunctiva normal bilaterally HENT: Normocephalic, atraumatic,mucus membranes moist Respiratory: Normal inspiratory effort Cardiovascular: Regular tachycardia, no murmurs rubs or gallops GI: Normal appearance, flat, soft, diffuse tenderness, maximal in the left lower quadrant.  Negative Murphy, negative McBurney.  No guarding, rebound.  Positive bowel  sounds.  Positive tap table test. Back: Left CVAT skin: No rash, skin intact Musculoskeletal: no deformities Neurologic: Alert & oriented x 3, no focal neuro deficits Psychiatric: Speech and behavior appropriate  ED Course   Medications - No data to display  No orders of the defined types were placed in this encounter.   No results found for this or any previous visit (from the past 24 hour(s)). No results found.  ED Clinical Impression  1. Left lower quadrant abdominal pain      ED Assessment/Plan  Patient appears to be in moderate painful distress, is tachycardic, hypertensive.  She has some peritoneal signs.  Concern for perforated diverticula, however in the differential is pyelonephritis, nephrolithiasis, colitis.  Transferring to the [facility] emergency department for comprehensive evaluation, pain control.  Deferred giving medication here as I do not know what the patient's kidney function is, advised her to have nothing to eat or drink until her ER evaluation is complete.   Discussed MDM, treatment plan, and plan for follow-up with patient. Discussed sn/sx that should prompt return to the ED. patient agrees with plan.   No orders of the defined types were placed in this encounter.   *This clinic note was created using Dragon dictation software. Therefore, there may be occasional mistakes  despite careful proofreading.   ?   Melynda Ripple, MD 06/06/19 2007

## 2019-06-06 NOTE — Discharge Instructions (Addendum)
Do not have anything to eat or drink until your ER evaluation is complete.  Let them know if your pain changes, starts to get worse.

## 2019-06-07 ENCOUNTER — Emergency Department (HOSPITAL_COMMUNITY): Payer: Medicare HMO

## 2019-06-07 ENCOUNTER — Encounter (HOSPITAL_COMMUNITY): Payer: Self-pay | Admitting: Internal Medicine

## 2019-06-07 DIAGNOSIS — F418 Other specified anxiety disorders: Secondary | ICD-10-CM | POA: Diagnosis present

## 2019-06-07 DIAGNOSIS — K5792 Diverticulitis of intestine, part unspecified, without perforation or abscess without bleeding: Secondary | ICD-10-CM | POA: Diagnosis not present

## 2019-06-07 LAB — SARS CORONAVIRUS 2 (TAT 6-24 HRS): SARS Coronavirus 2: NEGATIVE

## 2019-06-07 MED ORDER — CIPROFLOXACIN IN D5W 400 MG/200ML IV SOLN
400.0000 mg | Freq: Once | INTRAVENOUS | Status: AC
  Administered 2019-06-07: 400 mg via INTRAVENOUS
  Filled 2019-06-07: qty 200

## 2019-06-07 MED ORDER — ALUM & MAG HYDROXIDE-SIMETH 200-200-20 MG/5ML PO SUSP: 30.0000 mL | ORAL | Status: DC | PRN

## 2019-06-07 MED ORDER — ACETAMINOPHEN 650 MG RE SUPP: 650.0000 mg | Freq: Four times a day (QID) | RECTAL | Status: DC | PRN

## 2019-06-07 MED ORDER — GABAPENTIN 100 MG PO CAPS
100.0000 mg | ORAL_CAPSULE | Freq: Three times a day (TID) | ORAL | Status: DC
  Administered 2019-06-07 – 2019-06-16 (×28): 100 mg via ORAL
  Filled 2019-06-07 (×28): qty 1

## 2019-06-07 MED ORDER — DONEPEZIL HCL 5 MG PO TABS
5.0000 mg | ORAL_TABLET | Freq: Every day | ORAL | Status: DC
  Filled 2019-06-07: qty 1

## 2019-06-07 MED ORDER — FAMOTIDINE IN NACL 20-0.9 MG/50ML-% IV SOLN
INTRAVENOUS | Status: AC
  Filled 2019-06-07: qty 50

## 2019-06-07 MED ORDER — ACETAMINOPHEN 325 MG PO TABS
650.0000 mg | ORAL_TABLET | Freq: Four times a day (QID) | ORAL | Status: DC | PRN
  Administered 2019-06-07 – 2019-06-16 (×5): 650 mg via ORAL
  Filled 2019-06-07 (×5): qty 2

## 2019-06-07 MED ORDER — FENTANYL CITRATE (PF) 100 MCG/2ML IJ SOLN
50.0000 ug | Freq: Once | INTRAMUSCULAR | Status: AC
  Administered 2019-06-07: 50 ug via INTRAVENOUS
  Filled 2019-06-07: qty 2

## 2019-06-07 MED ORDER — DONEPEZIL HCL 5 MG PO TABS
5.0000 mg | ORAL_TABLET | Freq: Every day | ORAL | Status: DC
  Administered 2019-06-07 – 2019-06-15 (×9): 5 mg via ORAL
  Filled 2019-06-07 (×9): qty 1

## 2019-06-07 MED ORDER — CLONAZEPAM 0.5 MG PO TABS
0.5000 mg | ORAL_TABLET | Freq: Every day | ORAL | Status: DC
  Administered 2019-06-07 – 2019-06-15 (×9): 0.5 mg via ORAL
  Filled 2019-06-07 (×9): qty 1

## 2019-06-07 MED ORDER — ONDANSETRON HCL 4 MG PO TABS
4.0000 mg | ORAL_TABLET | Freq: Four times a day (QID) | ORAL | Status: DC | PRN
  Administered 2019-06-07: 4 mg via ORAL
  Filled 2019-06-07: qty 1

## 2019-06-07 MED ORDER — FERROUS SULFATE 325 (65 FE) MG PO TABS
325.0000 mg | ORAL_TABLET | Freq: Every day | ORAL | Status: DC
  Administered 2019-06-08 – 2019-06-13 (×6): 325 mg via ORAL
  Filled 2019-06-07 (×6): qty 1

## 2019-06-07 MED ORDER — ISOSORB DINITRATE-HYDRALAZINE 20-37.5 MG PO TABS
2.0000 | ORAL_TABLET | Freq: Three times a day (TID) | ORAL | Status: DC
  Administered 2019-06-07 – 2019-06-08 (×4): 2 via ORAL
  Filled 2019-06-07 (×4): qty 2

## 2019-06-07 MED ORDER — LACTATED RINGERS IV SOLN
INTRAVENOUS | Status: AC
  Administered 2019-06-07: 11:00:00 via INTRAVENOUS

## 2019-06-07 MED ORDER — METOPROLOL TARTRATE 25 MG PO TABS
12.5000 mg | ORAL_TABLET | Freq: Once | ORAL | Status: AC
  Administered 2019-06-07: 12.5 mg via ORAL
  Filled 2019-06-07: qty 1

## 2019-06-07 MED ORDER — HYDRALAZINE HCL 25 MG PO TABS
25.0000 mg | ORAL_TABLET | Freq: Once | ORAL | Status: AC
  Administered 2019-06-07: 25 mg via ORAL
  Filled 2019-06-07: qty 1

## 2019-06-07 MED ORDER — METRONIDAZOLE IN NACL 5-0.79 MG/ML-% IV SOLN
500.0000 mg | Freq: Once | INTRAVENOUS | Status: AC
  Administered 2019-06-07: 500 mg via INTRAVENOUS
  Filled 2019-06-07: qty 100

## 2019-06-07 MED ORDER — ROSUVASTATIN CALCIUM 20 MG PO TABS
40.0000 mg | ORAL_TABLET | Freq: Every day | ORAL | Status: DC
  Administered 2019-06-07 – 2019-06-15 (×9): 40 mg via ORAL
  Filled 2019-06-07 (×9): qty 2

## 2019-06-07 MED ORDER — ALUM & MAG HYDROXIDE-SIMETH 200-200-20 MG/5ML PO SUSP
ORAL | Status: AC
  Filled 2019-06-07: qty 30

## 2019-06-07 MED ORDER — PIPERACILLIN-TAZOBACTAM 3.375 G IVPB
3.3750 g | Freq: Three times a day (TID) | INTRAVENOUS | Status: DC
  Filled 2019-06-07: qty 50

## 2019-06-07 MED ORDER — PIPERACILLIN-TAZOBACTAM 3.375 G IVPB
3.3750 g | Freq: Two times a day (BID) | INTRAVENOUS | Status: DC
  Administered 2019-06-07 – 2019-06-11 (×8): 3.375 g via INTRAVENOUS
  Filled 2019-06-07 (×8): qty 50

## 2019-06-07 MED ORDER — BUSPIRONE HCL 5 MG PO TABS
15.0000 mg | ORAL_TABLET | Freq: Three times a day (TID) | ORAL | Status: DC | PRN
  Administered 2019-06-11 (×2): 15 mg via ORAL
  Filled 2019-06-07 (×2): qty 1

## 2019-06-07 MED ORDER — MORPHINE SULFATE (PF) 2 MG/ML IV SOLN
2.0000 mg | INTRAVENOUS | Status: DC | PRN
  Administered 2019-06-07 – 2019-06-08 (×3): 2 mg via INTRAVENOUS
  Filled 2019-06-07 (×3): qty 1

## 2019-06-07 MED ORDER — CITALOPRAM HYDROBROMIDE 20 MG PO TABS
20.0000 mg | ORAL_TABLET | Freq: Every day | ORAL | Status: DC
  Administered 2019-06-07 – 2019-06-16 (×10): 20 mg via ORAL
  Filled 2019-06-07 (×10): qty 1

## 2019-06-07 MED ORDER — ONDANSETRON HCL 4 MG/2ML IJ SOLN
4.0000 mg | Freq: Once | INTRAMUSCULAR | Status: AC
  Administered 2019-06-07: 4 mg via INTRAVENOUS
  Filled 2019-06-07: qty 2

## 2019-06-07 MED ORDER — METOPROLOL TARTRATE 25 MG PO TABS
12.5000 mg | ORAL_TABLET | Freq: Two times a day (BID) | ORAL | Status: DC
  Administered 2019-06-07 – 2019-06-11 (×10): 12.5 mg via ORAL
  Filled 2019-06-07 (×10): qty 1

## 2019-06-07 MED ORDER — HYDRALAZINE HCL 20 MG/ML IJ SOLN
5.0000 mg | INTRAMUSCULAR | Status: DC | PRN
  Administered 2019-06-09 (×2): 5 mg via INTRAVENOUS
  Filled 2019-06-07 (×3): qty 1

## 2019-06-07 MED ORDER — LIDOCAINE VISCOUS HCL 2 % MT SOLN
OROMUCOSAL | Status: AC
  Filled 2019-06-07: qty 15

## 2019-06-07 MED ORDER — ONDANSETRON HCL 4 MG/2ML IJ SOLN: 4.0000 mg | Freq: Four times a day (QID) | INTRAMUSCULAR | Status: DC | PRN

## 2019-06-07 MED ORDER — VALPROIC ACID 250 MG PO CAPS
250.0000 mg | ORAL_CAPSULE | Freq: Two times a day (BID) | ORAL | Status: DC
  Administered 2019-06-07 – 2019-06-16 (×19): 250 mg via ORAL
  Filled 2019-06-07 (×20): qty 1

## 2019-06-07 MED ORDER — ISOSORBIDE MONONITRATE ER 60 MG PO TB24
60.0000 mg | ORAL_TABLET | Freq: Every day | ORAL | Status: DC
  Administered 2019-06-07 – 2019-06-10 (×4): 60 mg via ORAL
  Filled 2019-06-07 (×5): qty 1

## 2019-06-07 MED ORDER — ENOXAPARIN SODIUM 30 MG/0.3ML ~~LOC~~ SOLN
30.0000 mg | SUBCUTANEOUS | Status: DC
  Administered 2019-06-07: 30 mg via SUBCUTANEOUS
  Filled 2019-06-07: qty 0.3

## 2019-06-07 MED ORDER — SODIUM CHLORIDE 0.9 % IV BOLUS
1000.0000 mL | Freq: Once | INTRAVENOUS | Status: AC
  Administered 2019-06-07: 1000 mL via INTRAVENOUS

## 2019-06-07 MED ORDER — FENTANYL CITRATE (PF) 100 MCG/2ML IJ SOLN
INTRAMUSCULAR | Status: AC
  Filled 2019-06-07: qty 2

## 2019-06-07 NOTE — Plan of Care (Signed)
  Problem: Education: Goal: Knowledge of General Education information will improve Description: Including pain rating scale, medication(s)/side effects and non-pharmacologic comfort measures Outcome: Progressing   Problem: Clinical Measurements: Goal: Diagnostic test results will improve Outcome: Progressing   Problem: Coping: Goal: Level of anxiety will decrease Outcome: Progressing   Problem: Pain Managment: Goal: General experience of comfort will improve Outcome: Progressing   

## 2019-06-07 NOTE — H&P (Signed)
History and Physical    Alyssa [name] FTD:[id number] DOB: September 16, 1941 DOA: 06/06/2019  PCP: Everardo Beals, NP Consultants:   [place] - cardiology; Krista Blue - neurology; Netta Neat - neurosurgery Patient coming from:  Home - lives with brother; Alyssa [name]: Alyssa [name], [phone]  Chief Complaint: abdominal pain  HPI: Alyssa [name] is a 78 y.o. female with medical history significant of PVD; HTN; HLD; and CKD presenting with abdominal pain.  She noticed left hip pain into her stomach.  The pain started yesterday AM or the night before.  No n/v.  +heartburn.  Last BM was last night and it was runny and black looking.  No fever.   ED Course: Carryover, per Dr. Hal Hope:  78 year old female with history of chronic kidney disease hypertension presents with persistent left lower quadrant pain CAT scan shows diverticulitis with no complication but due to persistent pain patient admitted for further management of diverticulitis.  Review of Systems: As per HPI; otherwise review of systems reviewed and negative.   Ambulatory Status:  Ambulates with a walker  Past Medical History:  Diagnosis Date   Anxiety    Arthritis    Chest pain    Chronic kidney disease    Elevated troponin 12/13/2018   Headache(784.0)    Hyperlipidemia    Hypertension    Joint pain    Leg pain    Peripheral neuropathy    Peripheral vascular disease [facility])     Past Surgical History:  Procedure Laterality Date   ABDOMINAL ANGIOGRAM N/A 10/29/2011   Procedure: ABDOMINAL ANGIOGRAM;  Surgeon: Elam Dutch, MD;  Location: [facility] CATH LAB;  Service: Cardiovascular;  Laterality: N/A;   BACK SURGERY     FEMORAL-FEMORAL BYPASS GRAFT  08/31/2010   JOINT REPLACEMENT Right 2012   Hip   LOWER EXTREMITY ANGIOGRAPHY  06/14/2017   Procedure: Lower Extremity Angiography;  Surgeon: Adrian Prows, MD;  Location: [place] CV LAB;  Service: Cardiovascular;;  Bilateral limited angio performed   RENAL  ANGIOGRAPHY N/A 06/14/2017   Procedure: Renal Angiography;  Surgeon: Adrian Prows, MD;  Location: [place] CV LAB;  Service: Cardiovascular;  Laterality: N/A;   TOTAL HIP ARTHROPLASTY     right    Social History   Socioeconomic History   Marital status: Single    Spouse name: Not on file   Number of children: 2   Years of education: 11th   Highest education level: Not on file  Occupational History   Occupation: Retired  Scientist, product/process development strain: Not on file   Food insecurity    Worry: Not on file    Inability: Not on Lexicographer needs    Medical: Not on file    Non-medical: Not on file  Tobacco Use   Smoking status: Former Smoker    Packs/day: 1.00    Years: 27.00    Pack years: 27.00    Types: Cigarettes    Quit date: 11/22/2018    Years since quitting: 0.5   Smokeless tobacco: Never Used  Substance and Sexual Activity   Alcohol use: No   Drug use: No   Sexual activity: Not on file  Lifestyle   Physical activity    Days per week: Not on file    Minutes per session: Not on file   Stress: Not on file  Relationships   Social connections    Talks on phone: Not on file    Gets together: Not on file  Attends religious service: Not on file    Active member of club or organization: Not on file    Attends meetings of clubs or organizations: Not on file    Relationship status: Not on file   Intimate partner violence    Fear of current or ex partner: Not on file    Emotionally abused: Not on file    Physically abused: Not on file    Forced sexual activity: Not on file  Other Topics Concern   Not on file  Social History Narrative    Lives at home with her brother.   Right-handed.   Occasional caffeine use.    No Known Allergies  Family History  Problem Relation Age of Onset   Heart attack Mother    Heart disease Mother    Hyperlipidemia Mother    Hypertension Mother    Heart attack Father    Heart disease  Father        Before age 33   Hyperlipidemia Father    Hypertension Father    Cancer Sister        brain tumor   Aneurysm Brother        brain   Colon cancer Neg Hx    Liver cancer Neg Hx     Prior to Admission medications   Medication Sig Start Date End Date Taking? Authorizing Provider  aspirin 81 MG EC tablet Take 1 tablet (81 mg total) by mouth daily. 01/09/19  Yes Gherghe, Vella Redhead, MD  busPIRone (BUSPAR) 15 MG tablet Take 15 mg by mouth 3 (three) times daily as needed for anxiety. 02/28/19  Yes [provider]  citalopram (CELEXA) 20 MG tablet Take 1 tablet (20 mg total) by mouth daily. 03/23/19 03/22/20 Yes Shelly Coss, MD  clonazePAM (KLONOPIN) 1 MG tablet Take 0.5 tablets (0.5 mg total) by mouth 2 (two) times daily as needed for anxiety. Patient taking differently: Take 0.5 mg by mouth at bedtime.  01/09/19  Yes Caren Griffins, MD  clopidogrel (PLAVIX) 75 MG tablet Take 1 tablet (75 mg total) by mouth daily. 01/09/19  Yes Gherghe, Vella Redhead, MD  donepezil (ARICEPT) 5 MG tablet Take 5 mg by mouth daily.   Yes [provider]  ergocalciferol (VITAMIN D2) 50000 units capsule Take 50,000 Units by mouth every Monday.    Yes [provider]  Ferrous Sulfate (IRON HIGH-POTENCY) 325 MG TABS Take 325 mg by mouth daily with breakfast.   Yes [provider]  gabapentin (NEURONTIN) 100 MG capsule Take 1 capsule (100 mg total) by mouth 3 (three) times daily. 03/12/19  Yes Pokhrel, Laxman, MD  hydrALAZINE (APRESOLINE) 25 MG tablet Take 1 tablet (25 mg total) by mouth 3 (three) times daily. 01/09/19  Yes Caren Griffins, MD  isosorbide mononitrate (IMDUR) 60 MG 24 hr tablet Take 1 tablet (60 mg total) by mouth daily. 01/09/19  Yes Gherghe, Vella Redhead, MD  isosorbide-hydrALAZINE (BIDIL) 20-37.5 MG tablet Take 2 tablets by mouth 3 (three) times daily.   Yes [provider]  magnesium oxide (MAG-OX) 400 (241.3 Mg) MG tablet Take 1 tablet (400 mg total)  by mouth 2 (two) times daily. 03/12/19  Yes Pokhrel, Laxman, MD  megestrol (MEGACE) 40 MG tablet Take 40 mg by mouth 2 (two) times daily.   Yes [provider]  metoprolol tartrate (LOPRESSOR) 25 MG tablet Take 0.5 tablets (12.5 mg total) by mouth 2 (two) times daily. 03/12/19  Yes Pokhrel, Corrie Mckusick, MD  nicotine (NICODERM CQ -  DOSED IN MG/24 HOURS) 21 mg/24hr patch Place 1 patch (21 mg total) onto the skin daily. 03/13/19  Yes Pokhrel, Laxman, MD  rosuvastatin (CRESTOR) 40 MG tablet Take 1 tablet (40 mg total) by mouth daily. Patient taking differently: Take 40 mg by mouth at bedtime.  01/09/19  Yes Caren Griffins, MD  valproic acid (DEPAKENE) 250 MG capsule Take 1 capsule (250 mg total) by mouth 2 (two) times daily. 01/09/19  Yes Gherghe, Vella Redhead, MD  furosemide (LASIX) 40 MG tablet Take 1 tablet (40 mg total) by mouth daily for 30 days. 03/23/19 04/22/19  Shelly Coss, MD  potassium chloride 20 MEQ TBCR Take 20 mEq by mouth daily for 30 days. Patient not taking: Reported on 06/07/2019 03/23/19 04/22/19  Shelly Coss, MD  traMADol (ULTRAM) 50 MG tablet Take 1 tablet (50 mg total) by mouth every 6 (six) hours as needed for moderate pain. Patient not taking: Reported on 06/07/2019 01/09/19   Caren Griffins, MD    Physical Exam: Vitals:   06/07/19 0730 06/07/19 0745 06/07/19 0824 06/07/19 1000  BP:   (!) 171/104   Pulse:   82   Resp: 15 15    Temp:   98.3 F (36.8 C)   TempSrc:   Oral   SpO2:   100%   Weight:    44.9 kg  Height:    5\' 2"  (1.575 m)      General:  Appears calm, frail, chronically ill  Eyes:  PERRL, EOMI, normal lids, iris  ENT:  grossly normal hearing, lips & tongue, mmm  Neck:  no LAD, masses or thyromegaly  Cardiovascular:  RRR, no m/r/g. No LE edema.   Respiratory:   CTA bilaterally with no wheezes/rales/rhonchi.  Normal respiratory effort.  Abdomen:  soft, TTP LLQ > LUQ, ND, NABS  Skin:  no rash or induration seen on limited exam  Musculoskeletal:   grossly normal tone BUE/BLE, good ROM, no bony abnormality  Psychiatric:  blunted mood and affect, speech fluent and appropriate, AOx3  Neurologic:  CN 2-12 grossly intact, moves all extremities in coordinated fashion    Radiological Exams on Admission: Ct Abdomen Pelvis Wo Contrast  Result Date: 06/07/2019 CLINICAL DATA:  Left-sided abdominal pain for 2 days EXAM: CT ABDOMEN AND PELVIS WITHOUT CONTRAST TECHNIQUE: Multidetector CT imaging of the abdomen and pelvis was performed following the standard protocol without IV contrast. COMPARISON:  03/09/2019 FINDINGS: Lower chest: Lung bases are free of acute infiltrate or sizable effusion. Hepatobiliary: The liver and gallbladder are within normal limits. Pancreas: Pancreatic atrophy is identified Spleen: Normal in size without focal abnormality. Adrenals/Urinary Tract: Adrenal glands are within normal limits. No renal calculi or obstructive changes are seen. The bladder is well distended. Stomach/Bowel: Contrast is noted throughout the colon. Changes of diverticulosis are seen. Wall thickening of the descending and sigmoid colon is seen without definitive perforation or abscess formation. The appendix is unremarkable. Small bowel shows no obstructive changes. Stomach shows evidence of a hiatal hernia. Some distal esophageal thickening is noted as well which may be related to reflux. Clinical correlation is recommended. Vascular/Lymphatic: Aortic atherosclerosis. No enlarged abdominal or pelvic lymph nodes. Reproductive: Uterus and bilateral adnexa are unremarkable. Other: No abdominal wall hernia or abnormality. No abdominopelvic ascites. Musculoskeletal: Changes of prior right hip replacement are seen. Degenerative changes of lumbar spine are noted. Changes of prior fusion at L4-5 are seen. IMPRESSION: Distal colonic wall thickening consistent with mild colitis. No perforation or abscess is identified. Hiatal hernia  with distal esophageal thickening likely  related to reflux. Clinical correlation is recommended. Chronic changes as described above. Electronically Signed   By: Inez Catalina M.D.   On: 06/07/2019 10:18    EKG: not done   Labs on Admission: I have personally reviewed the available labs and imaging studies at the time of the admission.  Pertinent labs:   K+ 5.3 CO2 9 Glucose 106 BUN 46/Creatinine 2.12/GFR 26 - stable from 6/24 WBC 12.8 Hgb 10.0 UA: 100 protein COVID pending   Assessment/Plan Principal Problem:   Diverticulitis Active Problems:   Essential hypertension   Hyperlipidemia   CKD (chronic kidney disease) stage 4, GFR 15-29 ml/min (HCC)   Chronic pain syndrome   Depression with anxiety   Diverticulitis -Patient's symptoms are c/w diverticulitis and her CT supports this as a diagnosis -+ SIRS criteria on admission (tachycardia, tachypnea, leukocytosis) without apparent acute organ failure to suggest sepsis -For now, will give clear liquid diet, IVF, pain medication with morphine, nausea medication with Zofran, and treat with Zosyn for intraabdominal infection -If not improving, she may need GI/surgery consultation  HTN -Continue Bidil, but hold additional PO hydralazine and add prn IV -Continue Lopressor  HLD -Continue Crestor  Stage 4 CKD -Appears to be stable at this time  Chronic pain -On Neurontin -Previously was taking Tramadol but this does not appear to have been filled since 6/24 -Also was taking Temazepam but this was last filled 7/6 and was not listed on her [facility]  Depression/anxiety -Continue home medications: Buspar, Celexa, Klonopin -On Aricept, suggesting prior diagnosis of dementia although I do not see this reported in her chart -Hold Megace for now -On Depakote - ?as mood stabilizer, as I do not appreciate seizures in her PMH and do not see a prior EEG in procedures or mention of prior seizures in her last neurology note from 03/27/18  PVD -Hold ASA and Plavix for now due to  possible concern for bloody stools; guaiac is pending -If diverticulitis does not improve, she could also require procedure/surgery -Need to resume as soon as reasonable -Continue Imdur, Bidil   Note: This patient has been tested and is negative for the novel coronavirus COVID-19.  DVT prophylaxis:  Lovenox Code Status:  DNR - confirmed with patient Family Communication: None present; I was unable to reach her son by telephone Disposition Plan:  Home once clinically improved Consults called: None  Admission status: It is my clinical opinion that referral for OBSERVATION is reasonable and necessary in this patient based on the above information provided. The aforementioned taken together are felt to place the patient at high risk for further clinical deterioration. However it is anticipated that the patient may be medically stable for discharge from the hospital within 24 to 48 hours.    Karmen Bongo MD [facility]   How to contact the [facility] Attending or Consulting provider [place] or covering provider during after hours [place], for this patient?  1. Check the care team in [facility] and look for a) attending/consulting [facility] provider listed and b) the [facility] team listed 2. Log into [email or web address] and use [facility] universal password to access. If you do not have the password, please contact the hospital operator. 3. Locate the [facility] provider you are looking for under [facility] and page to a number that you can be directly reached. 4. If you still have difficulty reaching the provider, please page the [facility] (Director on Call) for the Hospitalists listed on amion for  assistance.   06/07/2019, 1:48 PM

## 2019-06-07 NOTE — ED Provider Notes (Signed)
[facility] EMERGENCY DEPARTMENT Provider Note   CSN: [id number] Arrival date & time: 06/06/19  2013     History   Chief Complaint Chief Complaint  Patient presents with  . Abdominal Pain    HPI Alyssa [name] is a 78 y.o. female.     78 y/o female with PMH of NSTEMI, CHF, chronic kidney disease stage IV, anemia, hypertension, peripheral vascular disease, coronary artery disease, nephrolithiasis presents to the emergency department from urgent care for evaluation of left lower quadrant abdominal pain.  Pain began at 11 AM on Tuesday morning.  It has gradually worsened and has remained constant.  She describes the pain as sharp.  It is nonradiating.  Symptoms associated with anorexia.  She has not had any fevers, vomiting, diarrhea, melena or hematochezia, urinary symptoms, new or worsening extremity numbness or paresthesias.  No history of abdominal surgeries.  No medications taken prior to arrival.  Last colonoscopy in 2019.  The history is provided by the patient. No language interpreter was used.  Abdominal Pain   Past Medical History:  Diagnosis Date  . Anxiety   . Arthritis   . Chest pain   . Chronic kidney disease   . Elevated troponin 12/13/2018  . Headache(784.0)   . Hyperlipidemia   . Hypertension   . Joint pain   . Leg pain   . Peripheral neuropathy   . Peripheral vascular disease [facility])     Patient Active Problem List   Diagnosis Date Noted  . Diverticulitis 06/07/2019  . DNR (do not resuscitate) 03/23/2019  . Volume overload 03/23/2019  . Edema 03/23/2019  . CKD (chronic kidney disease) stage 3, GFR 30-59 ml/min (HCC) 03/08/2019  . Acute renal failure (ARF) ([place]) 03/08/2019  . Hypokalemia 03/08/2019  . Palliative care by specialist   . Goals of care, counseling/discussion   . Altered mental status   . Encephalopathy, toxic 12/24/2018  . Aspiration pneumonia of both lower lobes due to gastric secretions ([place]) 12/24/2018  .  Non-ST elevation (NSTEMI) myocardial infarction ([place])   . Chest pain   . Dyspnea on exertion   . Elevated troponin level 12/09/2018  . Chronic migraine 03/27/2018  . Lumbar radiculopathy 03/27/2018  . Gait abnormality 03/27/2018  . CKD (chronic kidney disease) stage 4, GFR 15-29 ml/min (HCC) 12/28/2016  . Symptomatic anemia 12/28/2016  . Chronic pain syndrome 12/28/2016  . Benzodiazepine withdrawal ([place]) 12/28/2016  . Benzodiazepine withdrawal without complication ([place]) 40/98/1191  . Chronic right shoulder pain 12/06/2016  . Fall 09/17/2016  . Fracture of humeral head, closed, right, initial encounter 09/17/2016  . Rib fractures 09/17/2016  . Multiple falls 09/17/2016  . Severe muscle deconditioning 09/17/2016  . Failure to thrive in adult 09/17/2016  . Melena 04/25/2016  . Essential hypertension 04/25/2016  . Anxiety state 04/25/2016  . Tobacco abuse 04/25/2016  . Hyperlipidemia 04/25/2016  . Syncope 04/24/2016  . Syncope and collapse 04/24/2016  . Gait difficulty 01/12/2016  . Foot drop, left 01/12/2016  . Severe recurrent major depression without psychotic features ([place]) 08/28/2015  . Spondylolisthesis at L4-L5 level 07/30/2015  . PVD (peripheral vascular disease) with claudication ([place]) 07/29/2014  . Weakness-Bilateral arm/leg 07/29/2014  . Numbness-left leg 07/29/2014  . Swelling of limb-Legs 07/29/2014  . Atherosclerosis of native arteries of the extremities with intermittent claudication 09/30/2011    Past Surgical History:  Procedure Laterality Date  . ABDOMINAL ANGIOGRAM N/A 10/29/2011   Procedure: ABDOMINAL ANGIOGRAM;  Surgeon: Elam Dutch, MD;  Location: [facility]  CATH LAB;  Service: Cardiovascular;  Laterality: N/A;  . BACK SURGERY    . FEMORAL-FEMORAL BYPASS GRAFT  08/31/2010  . JOINT REPLACEMENT Right 2012   Hip  . LOWER EXTREMITY ANGIOGRAPHY  06/14/2017   Procedure: Lower Extremity Angiography;  Surgeon: Adrian Prows, MD;  Location: [facility] CV LAB;  Service:  Cardiovascular;;  Bilateral limited angio performed  . RENAL ANGIOGRAPHY N/A 06/14/2017   Procedure: Renal Angiography;  Surgeon: Adrian Prows, MD;  Location: [facility] CV LAB;  Service: Cardiovascular;  Laterality: N/A;  . TOTAL HIP ARTHROPLASTY     right     OB History   No obstetric history on file.      Home Medications    Prior to Admission medications   Medication Sig Start Date End Date Taking? Authorizing Provider  aspirin 81 MG EC tablet Take 1 tablet (81 mg total) by mouth daily. 01/09/19   Caren Griffins, MD  busPIRone (BUSPAR) 15 MG tablet Take 15 mg by mouth 3 (three) times daily as needed for anxiety. 02/28/19   [provider]  citalopram (CELEXA) 20 MG tablet Take 1 tablet (20 mg total) by mouth daily. 03/23/19 03/22/20  Shelly Coss, MD  clonazePAM (KLONOPIN) 1 MG tablet Take 0.5 tablets (0.5 mg total) by mouth 2 (two) times daily as needed for anxiety. Patient taking differently: Take 0.5 mg by mouth at bedtime.  01/09/19   Caren Griffins, MD  clopidogrel (PLAVIX) 75 MG tablet Take 1 tablet (75 mg total) by mouth daily. 01/09/19   Caren Griffins, MD  donepezil (ARICEPT) 5 MG tablet Take 5 mg by mouth daily.    [provider]  ergocalciferol (VITAMIN D2) 50000 units capsule Take 50,000 Units by mouth every Monday.     [provider]  Ferrous Sulfate (IRON HIGH-POTENCY) 325 MG TABS Take 325 mg by mouth daily with breakfast.    [provider]  furosemide (LASIX) 40 MG tablet Take 1 tablet (40 mg total) by mouth daily for 30 days. 03/23/19 04/22/19  Shelly Coss, MD  gabapentin (NEURONTIN) 100 MG capsule Take 1 capsule (100 mg total) by mouth 3 (three) times daily. 03/12/19   Pokhrel, Corrie Mckusick, MD  hydrALAZINE (APRESOLINE) 25 MG tablet Take 1 tablet (25 mg total) by mouth 3 (three) times daily. 01/09/19   Caren Griffins, MD  isosorbide mononitrate (IMDUR) 60 MG 24 hr tablet Take 1 tablet (60 mg total) by mouth daily. 01/09/19    Gherghe, Vella Redhead, MD  isosorbide-hydrALAZINE (BIDIL) 20-37.5 MG tablet Take 2 tablets by mouth 3 (three) times daily.    [provider]  magnesium oxide (MAG-OX) 400 (241.3 Mg) MG tablet Take 1 tablet (400 mg total) by mouth 2 (two) times daily. 03/12/19   Pokhrel, Corrie Mckusick, MD  megestrol (MEGACE) 40 MG tablet Take 40 mg by mouth 2 (two) times daily.    [provider]  metoprolol tartrate (LOPRESSOR) 25 MG tablet Take 0.5 tablets (12.5 mg total) by mouth 2 (two) times daily. 03/12/19   Pokhrel, Corrie Mckusick, MD  nicotine (NICODERM CQ - DOSED IN MG/24 HOURS) 21 mg/24hr patch Place 1 patch (21 mg total) onto the skin daily. 03/13/19   Pokhrel, Corrie Mckusick, MD  potassium chloride 20 MEQ TBCR Take 20 mEq by mouth daily for 30 days. 03/23/19 04/22/19  Shelly Coss, MD  rosuvastatin (CRESTOR) 40 MG tablet Take 1 tablet (40 mg total) by mouth daily. Patient taking differently: Take 40 mg by mouth at bedtime.  01/09/19  Caren Griffins, MD  traMADol (ULTRAM) 50 MG tablet Take 1 tablet (50 mg total) by mouth every 6 (six) hours as needed for moderate pain. Patient taking differently: Take 25-50 mg by mouth every 8 (eight) hours as needed (for pain).  01/09/19   Caren Griffins, MD  valproic acid (DEPAKENE) 250 MG capsule Take 1 capsule (250 mg total) by mouth 2 (two) times daily. 01/09/19   Caren Griffins, MD    Family History Family History  Problem Relation Age of Onset  . Heart attack Mother   . Heart disease Mother   . Hyperlipidemia Mother   . Hypertension Mother   . Heart attack Father   . Heart disease Father        Before age 86  . Hyperlipidemia Father   . Hypertension Father   . Cancer Sister        brain tumor  . Aneurysm Brother        brain  . Colon cancer Neg Hx   . Liver cancer Neg Hx     Social History Social History   Tobacco Use  . Smoking status: Former Smoker    Packs/day: 0.25    Types: Cigarettes    Quit date: 11/22/2018    Years since quitting: 0.5  .  Smokeless tobacco: Never Used  . Tobacco comment: 5 cigs a day   Substance Use Topics  . Alcohol use: No  . Drug use: No     Allergies   Patient has no known allergies.   Review of Systems Review of Systems  Gastrointestinal: Positive for abdominal pain.  Ten systems reviewed and are negative for acute change, except as noted in the HPI.     Physical Exam Updated Vital Signs BP (!) 179/105 (BP Location: Right Arm)   Pulse 81   Temp (!) 97.5 F (36.4 C) (Oral)   Resp 12   SpO2 100%   Physical Exam Vitals signs and nursing note reviewed.  Constitutional:      General: She is not in acute distress.    Appearance: She is well-developed. She is not diaphoretic.     Comments: Patient appears uncomfortable, nontoxic.  HENT:     Head: Normocephalic and atraumatic.  Eyes:     General: No scleral icterus.    Conjunctiva/sclera: Conjunctivae normal.  Neck:     Musculoskeletal: Normal range of motion.  Cardiovascular:     Rate and Rhythm: Normal rate and regular rhythm.     Pulses: Normal pulses.     Comments: HR in 90's Pulmonary:     Effort: Pulmonary effort is normal. No respiratory distress.     Comments: Respirations even and unlabored Abdominal:     Comments: Focal LLQ TTP with mild voluntary guarding. No distension, masses, peritoneal signs.  Musculoskeletal: Normal range of motion.  Skin:    General: Skin is warm and dry.     Coloration: Skin is not pale.     Findings: No erythema or rash.  Neurological:     General: No focal deficit present.     Mental Status: She is alert and oriented to person, place, and time.     Coordination: Coordination normal.  Psychiatric:        Behavior: Behavior normal.      ED Treatments / Results  Labs (all labs ordered are listed, but only abnormal results are displayed) Labs Reviewed  COMPREHENSIVE METABOLIC PANEL - Abnormal; Notable for the following components:  Result Value   Potassium 5.3 (*)    Chloride 114  (*)    CO2 9 (*)    Glucose, Bld 106 (*)    BUN 46 (*)    Creatinine, Ser 2.12 (*)    GFR calc non Af Amer 22 (*)    GFR calc Af Amer 26 (*)    All other components within normal limits  CBC - Abnormal; Notable for the following components:   WBC 12.8 (*)    RBC 3.40 (*)    Hemoglobin 10.0 (*)    HCT 31.4 (*)    Platelets 563 (*)    All other components within normal limits  URINALYSIS, ROUTINE W REFLEX MICROSCOPIC - Abnormal; Notable for the following components:   Protein, ur 100 (*)    All other components within normal limits  SARS CORONAVIRUS 2  LIPASE, BLOOD    EKG None  Radiology No results found.  Procedures Procedures (including critical care time)  Medications Ordered in ED Medications  sodium chloride flush (NS) 0.9 % injection 3 mL (has no administration in time range)  famotidine (PEPCID) 20-0.9 MG/50ML-% IVPB (has no administration in time range)  alum & mag hydroxide-simeth (MAALOX/MYLANTA) 200-200-20 MG/5ML suspension (has no administration in time range)  lidocaine (XYLOCAINE) 2 % viscous mouth solution (has no administration in time range)  fentaNYL (SUBLIMAZE) 100 MCG/2ML injection (has no administration in time range)  ciprofloxacin (CIPRO) IVPB 400 mg (has no administration in time range)  metroNIDAZOLE (FLAGYL) IVPB 500 mg (has no administration in time range)  sodium chloride 0.9 % bolus 1,000 mL (1,000 mLs Intravenous New Bag/Given 06/07/19 0044)  fentaNYL (SUBLIMAZE) injection 50 mcg (50 mcg Intravenous Given 06/07/19 0127)  ondansetron (ZOFRAN) injection 4 mg (4 mg Intravenous Given 06/07/19 0127)  hydrALAZINE (APRESOLINE) tablet 25 mg (25 mg Oral Given 06/07/19 0127)  metoprolol tartrate (LOPRESSOR) tablet 12.5 mg (12.5 mg Oral Given 06/07/19 0127)    5:01 AM Case discussed with Dr. Hal Hope who will admit.  5:40 AM Imaging from PACS not crossing over. CT read as follows:  CT Abdomen/Pelvis WO Contrast Difficult assessment of the abdomen  and pelvis with lack of contrast, paucity of intraperitoneal fat and further complciated by extensive hardware streak artifact  High attenuation enteric contrast media is present throughout the distal small bowel and colon Worsening distal colonic thickening most pronounced distal sigmoid and rectosigmoid, could reflect colitis of infectious, inflammatory or vascular etiology. Furthermore, consider colonoscopy in the non acute setting.  thickened distal esophagus and moderate HH - similar to priors, correlate for features of reflux/esophagitis  Lumbar spinal fusion hardware and right THA, similar appearance to priors, no hardware complications, pancreatic atrophy, extensive atherosclerosis  - prelim by Dr Marguerita Merles 06/07/19@4 :12AM    Initial Impression / Assessment and Plan / ED Course  I have reviewed the triage vital signs and the nursing notes.  Pertinent labs & imaging results that were available during my care of the patient were reviewed by me and considered in my medical decision making (see chart for details).         78 year old female presents to the emergency department for complaints of left lower quadrant abdominal pain.  She has had nausea and anorexia without vomiting or bowel changes.  Denies urinary symptoms or fever.  Focally tender in the left lower quadrant on exam.  There is some voluntary guarding.  No peritoneal signs.  Work-up in the emergency department today is consistent with diverticulitis/colitis with leukocytosis of 12.8.  Her labs are suggestive of dehydration as she appears to be hemoconcentrated.  Chronically low bicarb with normal anion gap, but this is lower than normal today and is also suspected to be attributable to dehydration.  She was given IV fluids in the emergency department.  Started on IV antibiotics.  Given persistent pain with acute dehydration, mild AKI, SIRS criteria - will admit for observation for continued IVF, abx, pain control.  Case discussed  with Dr. Hal Hope of [facility].  Patient to be assessed by morning hospitalist team.   Final Clinical Impressions(s) / ED Diagnoses   Final diagnoses:  Diverticulitis    ED Discharge Orders    None       Antonietta Breach, PA-C 06/07/19 [id number]    Fatima Blank, MD 06/08/19 [phone]

## 2019-06-07 NOTE — Progress Notes (Addendum)
Pharmacy Antibiotic Note  Alyssa [name] is a 78 y.o. female admitted on 06/06/2019 with abdominal pain.  Pharmacy has been consulted for Zosyn dosing.  With CKD  Plan: Zosyn 3.375 grams iv Q 12 hours Pharmacy to sign off and follow peripherally for changes in renal function  Weight: 99 lb (44.9 kg)  Temp (24hrs), Avg:97.9 F (36.6 C), Min:97.5 F (36.4 C), Max:98.3 F (36.8 C)  Recent Labs  Lab 06/06/19 2106  WBC 12.8*  CREATININE 2.12*    Estimated Creatinine Clearance: 16.3 mL/min (A) (by C-G formula based on SCr of 2.12 mg/dL (H)).    No Known Allergies   Thank you for allowing pharmacy to be a part of this patient's care. Anette Guarneri, PharmD  06/07/2019 10:35 AM

## 2019-06-07 NOTE — ED Notes (Signed)
Attempted report to 5N. °

## 2019-06-08 DIAGNOSIS — Z87891 Personal history of nicotine dependence: Secondary | ICD-10-CM | POA: Diagnosis not present

## 2019-06-08 DIAGNOSIS — K2270 Barrett's esophagus without dysplasia: Secondary | ICD-10-CM | POA: Diagnosis present

## 2019-06-08 DIAGNOSIS — N184 Chronic kidney disease, stage 4 (severe): Secondary | ICD-10-CM | POA: Diagnosis present

## 2019-06-08 DIAGNOSIS — K5792 Diverticulitis of intestine, part unspecified, without perforation or abscess without bleeding: Secondary | ICD-10-CM | POA: Diagnosis not present

## 2019-06-08 DIAGNOSIS — G894 Chronic pain syndrome: Secondary | ICD-10-CM | POA: Diagnosis present

## 2019-06-08 DIAGNOSIS — R197 Diarrhea, unspecified: Secondary | ICD-10-CM | POA: Diagnosis not present

## 2019-06-08 DIAGNOSIS — K5909 Other constipation: Secondary | ICD-10-CM | POA: Diagnosis present

## 2019-06-08 DIAGNOSIS — E872 Acidosis: Secondary | ICD-10-CM | POA: Diagnosis present

## 2019-06-08 DIAGNOSIS — I739 Peripheral vascular disease, unspecified: Secondary | ICD-10-CM | POA: Diagnosis present

## 2019-06-08 DIAGNOSIS — I10 Essential (primary) hypertension: Secondary | ICD-10-CM

## 2019-06-08 DIAGNOSIS — F418 Other specified anxiety disorders: Secondary | ICD-10-CM

## 2019-06-08 DIAGNOSIS — E785 Hyperlipidemia, unspecified: Secondary | ICD-10-CM | POA: Diagnosis present

## 2019-06-08 DIAGNOSIS — Z79899 Other long term (current) drug therapy: Secondary | ICD-10-CM | POA: Diagnosis not present

## 2019-06-08 DIAGNOSIS — D631 Anemia in chronic kidney disease: Secondary | ICD-10-CM | POA: Diagnosis present

## 2019-06-08 DIAGNOSIS — Z66 Do not resuscitate: Secondary | ICD-10-CM | POA: Diagnosis present

## 2019-06-08 DIAGNOSIS — K5732 Diverticulitis of large intestine without perforation or abscess without bleeding: Secondary | ICD-10-CM | POA: Diagnosis present

## 2019-06-08 DIAGNOSIS — E875 Hyperkalemia: Secondary | ICD-10-CM | POA: Diagnosis present

## 2019-06-08 DIAGNOSIS — I130 Hypertensive heart and chronic kidney disease with heart failure and stage 1 through stage 4 chronic kidney disease, or unspecified chronic kidney disease: Secondary | ICD-10-CM | POA: Diagnosis present

## 2019-06-08 DIAGNOSIS — Z7982 Long term (current) use of aspirin: Secondary | ICD-10-CM | POA: Diagnosis not present

## 2019-06-08 DIAGNOSIS — I2510 Atherosclerotic heart disease of native coronary artery without angina pectoris: Secondary | ICD-10-CM | POA: Diagnosis present

## 2019-06-08 DIAGNOSIS — Z7902 Long term (current) use of antithrombotics/antiplatelets: Secondary | ICD-10-CM | POA: Diagnosis not present

## 2019-06-08 DIAGNOSIS — K219 Gastro-esophageal reflux disease without esophagitis: Secondary | ICD-10-CM | POA: Diagnosis present

## 2019-06-08 DIAGNOSIS — Z20828 Contact with and (suspected) exposure to other viral communicable diseases: Secondary | ICD-10-CM | POA: Diagnosis present

## 2019-06-08 DIAGNOSIS — E871 Hypo-osmolality and hyponatremia: Secondary | ICD-10-CM | POA: Diagnosis present

## 2019-06-08 DIAGNOSIS — N179 Acute kidney failure, unspecified: Secondary | ICD-10-CM | POA: Diagnosis not present

## 2019-06-08 DIAGNOSIS — K449 Diaphragmatic hernia without obstruction or gangrene: Secondary | ICD-10-CM | POA: Diagnosis present

## 2019-06-08 DIAGNOSIS — R933 Abnormal findings on diagnostic imaging of other parts of digestive tract: Secondary | ICD-10-CM | POA: Diagnosis not present

## 2019-06-08 DIAGNOSIS — F0390 Unspecified dementia without behavioral disturbance: Secondary | ICD-10-CM | POA: Diagnosis present

## 2019-06-08 DIAGNOSIS — D509 Iron deficiency anemia, unspecified: Secondary | ICD-10-CM | POA: Diagnosis present

## 2019-06-08 DIAGNOSIS — R1032 Left lower quadrant pain: Secondary | ICD-10-CM | POA: Diagnosis present

## 2019-06-08 LAB — BASIC METABOLIC PANEL
Anion gap: 9 (ref 5–15)
BUN: 45 mg/dL — ABNORMAL HIGH (ref 8–23)
CO2: 8 mmol/L — ABNORMAL LOW (ref 22–32)
Calcium: 8.7 mg/dL — ABNORMAL LOW (ref 8.9–10.3)
Chloride: 114 mmol/L — ABNORMAL HIGH (ref 98–111)
Creatinine, Ser: 2.22 mg/dL — ABNORMAL HIGH (ref 0.44–1.00)
GFR calc Af Amer: 24 mL/min — ABNORMAL LOW (ref >60)
GFR calc non Af Amer: 21 mL/min — ABNORMAL LOW (ref >60)
Glucose, Bld: 122 mg/dL — ABNORMAL HIGH (ref 70–99)
Potassium: 5.6 mmol/L — ABNORMAL HIGH (ref 3.5–5.1)
Sodium: 131 mmol/L — ABNORMAL LOW (ref 135–145)

## 2019-06-08 LAB — CBC
HCT: 24.3 % — ABNORMAL LOW (ref 36.0–46.0)
Hemoglobin: 7.9 g/dL — ABNORMAL LOW (ref 12.0–15.0)
MCH: 29 pg (ref 26.0–34.0)
MCHC: 32.5 g/dL (ref 30.0–36.0)
MCV: 89.3 fL (ref 80.0–100.0)
Platelets: 442 10*3/uL — ABNORMAL HIGH (ref 150–400)
RBC: 2.72 MIL/uL — ABNORMAL LOW (ref 3.87–5.11)
RDW: 13.9 % (ref 11.5–15.5)
WBC: 10.9 10*3/uL — ABNORMAL HIGH (ref 4.0–10.5)
nRBC: 0 % (ref 0.0–0.2)

## 2019-06-08 LAB — TYPE AND SCREEN
ABO/RH(D): O POS
Antibody Screen: NEGATIVE

## 2019-06-08 MED ORDER — SODIUM CHLORIDE 0.9 % IV SOLN
INTRAVENOUS | Status: DC
  Administered 2019-06-08 – 2019-06-09 (×5): via INTRAVENOUS

## 2019-06-08 MED ORDER — PATIROMER SORBITEX CALCIUM 8.4 G PO PACK: 8.4000 g | PACK | Freq: Every day | ORAL | Status: DC

## 2019-06-08 MED ORDER — SODIUM BICARBONATE 650 MG PO TABS
650.0000 mg | ORAL_TABLET | Freq: Three times a day (TID) | ORAL | Status: DC
  Administered 2019-06-08 – 2019-06-09 (×6): 650 mg via ORAL
  Filled 2019-06-08 (×6): qty 1

## 2019-06-08 MED ORDER — HYDROCODONE-ACETAMINOPHEN 5-325 MG PO TABS
1.0000 | ORAL_TABLET | Freq: Four times a day (QID) | ORAL | Status: DC | PRN
  Administered 2019-06-08 – 2019-06-14 (×16): 1 via ORAL
  Filled 2019-06-08 (×17): qty 1

## 2019-06-08 MED ORDER — PATIROMER SORBITEX CALCIUM 8.4 G PO PACK
8.4000 g | PACK | Freq: Once | ORAL | Status: AC
  Administered 2019-06-08: 8.4 g via ORAL
  Filled 2019-06-08 (×2): qty 1

## 2019-06-08 NOTE — Progress Notes (Signed)
PROGRESS NOTE  Alyssa [name] GYJ:[id number] DOB: 02/02/1941 DOA: 06/06/2019 PCP: Everardo Beals, NP  HPI/Recap of past 74 hours: 78 year old female with medical history significant for PVD, hypertension, hyperlipidemia, CKD, presents to the ED complaining of left lower quadrant pain.  Patient denies any nausea/vomiting, fever.  CT abdomen showed diverticulitis with no complication.  Due to persistent abdominal pain patient admitted for further management.    Today, patient still reports left lower quadrant pain, denies any nausea/vomiting, fever/chills, chest pain, shortness of breath, cough.   Assessment/Plan: Principal Problem:   Diverticulitis Active Problems:   Essential hypertension   Hyperlipidemia   CKD (chronic kidney disease) stage 4, GFR 15-29 ml/min (HCC)   Chronic pain syndrome   Depression with anxiety  Acute diverticulitis Currently afebrile with leukocytosis BC x2 pending collection CT abdomen showed distal colonic wall thickening consistent with mild colitis.  No perforation or abscess identified Advance to soft diet (renal diet) Continue IV fluids Continue IV Zosyn Monitor closely  ??AKI on CKD stage IV, with acidosis Baseline creatinine between 1.7- 2, although fluctuates Chronically acidotic, with bicarb around 8 Continue gentle hydration Start bicarb tabs 3 times daily Daily BMP  Hyponatremia Continue gentle hydration Daily BMP  Hyperkalemia Give 1 dose of [place] Daily BMP  Anemia of CKD Reported dark stools, but currently on iron supplementation Baseline hemoglobin between 8-9 Type and screen done Daily CBC  Hypertension Stable Continue Lopressor, Imdur, hold bidil  Hyperlipidemia Continue Crestor  PVD Continue to hold aspirin, Plavix pending FOBT Once hemoglobin back to baseline, will resume  Chronic pain Continue home regimen  Depression/anxiety/?Dementia Continue home regimen Continue Aricept          Malnutrition Type:      Malnutrition Characteristics:      Nutrition Interventions:       Estimated body mass index is 18.11 kg/m as calculated from the following:   Height as of this encounter: 5\' 2"  (1.575 m).   Weight as of this encounter: 44.9 kg.     Code Status: DNR  Family Communication: None at bedside  Disposition Plan: To be determined   Consultants:  None  Procedures:  None  Antimicrobials:  Zosyn  DVT prophylaxis: SCDs-until hemoglobin back to baseline   Objective: Vitals:   06/07/19 1732 06/07/19 1942 06/08/19 0427 06/08/19 0743  BP: 102/64 121/72 107/62 125/79  Pulse: 81 85 86 87  Resp:   19   Temp: 98.1 F (36.7 C) 97.6 F (36.4 C) 97.8 F (36.6 C) 98.5 F (36.9 C)  TempSrc: Oral Axillary Oral Oral  SpO2: 100% 97% 100% 100%  Weight:      Height:        Intake/Output Summary (Last 24 hours) at 06/08/2019 1101 Last data filed at 06/08/2019 0100 Gross per 24 hour  Intake 1104.1 ml  Output -  Net 1104.1 ml   Filed Weights   06/07/19 1000  Weight: 44.9 kg    Exam:  General: NAD   Cardiovascular: S1, S2 present  Respiratory: CTAB  Abdomen: Soft, LLQ tenderess, nondistended, bowel sounds present  Musculoskeletal: No bilateral pedal edema noted  Skin: Normal  Psychiatry: Normal mood   Data Reviewed: CBC: Recent Labs  Lab 06/06/19 2106 06/08/19 0254  WBC 12.8* 10.9*  HGB 10.0* 7.9*  HCT 31.4* 24.3*  MCV 92.4 89.3  PLT 563* 263*   Basic Metabolic Panel: Recent Labs  Lab 06/06/19 2106 06/08/19 0254  NA 135 131*  K 5.3* 5.6*  CL 114* 114*  CO2 9* 8*  GLUCOSE 106* 122*  BUN 46* 45*  CREATININE 2.12* 2.22*  CALCIUM 10.1 8.7*   GFR: Estimated Creatinine Clearance: 15.5 mL/min (A) (by C-G formula based on SCr of 2.22 mg/dL (H)). Liver Function Tests: Recent Labs  Lab 06/06/19 2106  AST 18  ALT 13  ALKPHOS 86  BILITOT 0.5  PROT 7.8  ALBUMIN 3.9   Recent Labs  Lab 06/06/19 2106  LIPASE 32    No results for input(s): AMMONIA in the last 168 hours. Coagulation Profile: No results for input(s): INR, PROTIME in the last 168 hours. Cardiac Enzymes: No results for input(s): CKTOTAL, CKMB, CKMBINDEX, TROPONINI in the last 168 hours. BNP (last 3 results) No results for input(s): PROBNP in the last 8760 hours. HbA1C: No results for input(s): HGBA1C in the last 72 hours. CBG: No results for input(s): GLUCAP in the last 168 hours. Lipid Profile: No results for input(s): CHOL, HDL, LDLCALC, TRIG, CHOLHDL, LDLDIRECT in the last 72 hours. Thyroid Function Tests: No results for input(s): TSH, T4TOTAL, FREET4, T3FREE, THYROIDAB in the last 72 hours. Anemia Panel: No results for input(s): VITAMINB12, FOLATE, FERRITIN, TIBC, IRON, RETICCTPCT in the last 72 hours. Urine analysis:    Component Value Date/Time   COLORURINE YELLOW 06/06/2019 2115   APPEARANCEUR CLEAR 06/06/2019 2115   LABSPEC 1.013 06/06/2019 2115   PHURINE 5.0 06/06/2019 2115   GLUCOSEU NEGATIVE 06/06/2019 2115   HGBUR NEGATIVE 06/06/2019 2115   BILIRUBINUR NEGATIVE 06/06/2019 2115   KETONESUR NEGATIVE 06/06/2019 2115   PROTEINUR 100 (A) 06/06/2019 2115   UROBILINOGEN 0.2 08/24/2015 1737   NITRITE NEGATIVE 06/06/2019 2115   LEUKOCYTESUR NEGATIVE 06/06/2019 2115   Sepsis Labs: @LABRCNTIP (procalcitonin:4,lacticidven:4)  ) Recent Results (from the past 240 hour(s))  SARS CORONAVIRUS 2 Nasal Swab Aptima Multi Swab     Status: None   Collection Time: 06/07/19  5:30 AM   Specimen: Aptima Multi Swab; Nasal Swab  Result Value Ref Range Status   SARS Coronavirus 2 NEGATIVE NEGATIVE Final    Comment: (NOTE) SARS-CoV-2 target nucleic acids are NOT DETECTED. The SARS-CoV-2 RNA is generally detectable in upper and lower respiratory specimens during the acute phase of infection. Negative results do not preclude SARS-CoV-2 infection, do not rule out co-infections with other pathogens, and should not be used as the sole  basis for treatment or other patient management decisions. Negative results must be combined with clinical observations, patient history, and epidemiological information. The expected result is Negative. Fact Sheet for Patients: SugarRoll.be Fact Sheet for Healthcare Providers: https://www.woods-mathews.com/ This test is not yet approved or cleared by the [place] FDA and  has been authorized for detection and/or diagnosis of SARS-CoV-2 by FDA under an Emergency Use Authorization (EUA). This EUA will remain  in effect (meaning this test can be used) for the duration of the COVID-19 declaration under Section 56 4(b)(1) of the Act, 21 U.S.C. section 360bbb-3(b)(1), unless the authorization is terminated or revoked sooner. Performed at [facility], [place] [address], [place], [place] [address]       Studies: No results found.  Scheduled Meds: . citalopram  20 mg Oral Daily  . clonazePAM  0.5 mg Oral QHS  . donepezil  5 mg Oral QHS  . ferrous sulfate  325 mg Oral Q breakfast  . gabapentin  100 mg Oral TID  . isosorbide mononitrate  60 mg Oral Daily  . isosorbide-hydrALAZINE  2 tablet Oral TID  . metoprolol tartrate  12.5 mg Oral BID  .  patiromer  8.4 g Oral Once  . rosuvastatin  40 mg Oral QHS  . sodium bicarbonate  650 mg Oral TID  . valproic acid  250 mg Oral BID    Continuous Infusions: . sodium chloride 75 mL/hr at 06/08/19 0926  . piperacillin-tazobactam (ZOSYN)  IV 3.375 g (06/08/19 0929)     LOS: 0 days     Alma Friendly, MD [facility]  If 7PM-7AM, please contact night-coverage [email or web address] 06/08/2019, 11:01 AM

## 2019-06-08 NOTE — Progress Notes (Signed)
Morning labs hemoglobin down to 7.9 from 10.0.  [facility] notified.  Patient states that she is fine and denies complaints.  To monitor.

## 2019-06-08 NOTE — Plan of Care (Signed)

## 2019-06-09 LAB — BASIC METABOLIC PANEL
Anion gap: 10 (ref 5–15)
BUN: 35 mg/dL — ABNORMAL HIGH (ref 8–23)
CO2: 10 mmol/L — ABNORMAL LOW (ref 22–32)
Calcium: 8.5 mg/dL — ABNORMAL LOW (ref 8.9–10.3)
Chloride: 116 mmol/L — ABNORMAL HIGH (ref 98–111)
Creatinine, Ser: 2.1 mg/dL — ABNORMAL HIGH (ref 0.44–1.00)
GFR calc Af Amer: 26 mL/min — ABNORMAL LOW (ref >60)
GFR calc non Af Amer: 22 mL/min — ABNORMAL LOW (ref >60)
Glucose, Bld: 93 mg/dL (ref 70–99)
Potassium: 4.8 mmol/L (ref 3.5–5.1)
Sodium: 136 mmol/L (ref 135–145)

## 2019-06-09 LAB — CBC WITH DIFFERENTIAL/PLATELET
Abs Immature Granulocytes: 0.03 10*3/uL (ref 0.00–0.07)
Basophils Absolute: 0.1 10*3/uL (ref 0.0–0.1)
Basophils Relative: 1 %
Eosinophils Absolute: 0.6 10*3/uL — ABNORMAL HIGH (ref 0.0–0.5)
Eosinophils Relative: 6 %
HCT: 23.1 % — ABNORMAL LOW (ref 36.0–46.0)
Hemoglobin: 7.6 g/dL — ABNORMAL LOW (ref 12.0–15.0)
Immature Granulocytes: 0 %
Lymphocytes Relative: 15 %
Lymphs Abs: 1.4 10*3/uL (ref 0.7–4.0)
MCH: 29.6 pg (ref 26.0–34.0)
MCHC: 32.9 g/dL (ref 30.0–36.0)
MCV: 89.9 fL (ref 80.0–100.0)
Monocytes Absolute: 0.9 10*3/uL (ref 0.1–1.0)
Monocytes Relative: 9 %
Neutro Abs: 6.4 10*3/uL (ref 1.7–7.7)
Neutrophils Relative %: 69 %
Platelets: 428 10*3/uL — ABNORMAL HIGH (ref 150–400)
RBC: 2.57 MIL/uL — ABNORMAL LOW (ref 3.87–5.11)
RDW: 14 % (ref 11.5–15.5)
WBC: 9.2 10*3/uL (ref 4.0–10.5)
nRBC: 0 % (ref 0.0–0.2)

## 2019-06-09 MED ORDER — HYDRALAZINE HCL 20 MG/ML IJ SOLN
10.0000 mg | INTRAMUSCULAR | Status: DC | PRN
  Administered 2019-06-09 – 2019-06-10 (×2): 10 mg via INTRAVENOUS
  Filled 2019-06-09 (×2): qty 1

## 2019-06-09 MED ORDER — ADULT MULTIVITAMIN W/MINERALS CH
1.0000 | ORAL_TABLET | Freq: Every day | ORAL | Status: DC
  Administered 2019-06-09 – 2019-06-16 (×8): 1 via ORAL
  Filled 2019-06-09 (×8): qty 1

## 2019-06-09 MED ORDER — HYDRALAZINE HCL 25 MG PO TABS
25.0000 mg | ORAL_TABLET | Freq: Three times a day (TID) | ORAL | Status: DC
  Administered 2019-06-09 – 2019-06-10 (×6): 25 mg via ORAL
  Filled 2019-06-09 (×7): qty 1

## 2019-06-09 MED ORDER — ENSURE ENLIVE PO LIQD
237.0000 mL | Freq: Two times a day (BID) | ORAL | Status: DC
  Administered 2019-06-10 – 2019-06-16 (×12): 237 mL via ORAL

## 2019-06-09 NOTE — Plan of Care (Signed)

## 2019-06-09 NOTE — Progress Notes (Signed)
Prn hydralazine given for BP 156/98, decreased to 142/90.  Continue to monitor.

## 2019-06-09 NOTE — Progress Notes (Signed)
PROGRESS NOTE  MAKENLI DERSTINE WUJ:[id number] DOB: [birth year] DOA: 06/06/2019 PCP: Everardo Beals, NP  HPI/Recap of past 74 hours: 78 year old female with medical history significant for PVD, hypertension, hyperlipidemia, CKD, presents to the ED complaining of left lower quadrant pain.  Patient denies any nausea/vomiting, fever.  CT abdomen showed diverticulitis with no complication.  Due to persistent abdominal pain patient admitted for further management.   Today, patient still with significant left lower quadrant abdominal pain, denies any nausea/vomiting, diarrhea, chest pain, fever/chills.   Assessment/Plan: Principal Problem:   Diverticulitis Active Problems:   Essential hypertension   Hyperlipidemia   CKD (chronic kidney disease) stage 4, GFR 15-29 ml/min (HCC)   Chronic pain syndrome   Depression with anxiety  Acute diverticulitis Currently afebrile with leukocytosis BC x2 NGTD CT abdomen showed distal colonic wall thickening consistent with mild colitis.  No perforation or abscess identified Change back to full liquid diet since abdominal pain worsened after advanced diet  Continue gentle hydration Continue IV Zosyn Monitor closely  ??AKI on CKD stage IV, with metabolic acidosis Baseline creatinine between 1.7- 2, although fluctuates Chronically acidotic, with bicarb around 8 Continue gentle hydration Continue bicarb tabs 3 times daily Daily BMP  Hyperkalemia S/P 1 dose of Veltassa Daily BMP  Anemia of CKD Reported dark stools, but currently on iron supplementation Baseline hemoglobin between 8-9 Type and screen done, transfuse if hemoglobin less than 7 Daily CBC  Hypertension BP elevated Continue Lopressor, Imdur, hydralazine, continue to hold bidil  Hyperlipidemia Continue Crestor  PVD Continue to hold aspirin, Plavix pending FOBT Once hemoglobin back to baseline, will resume  Chronic pain Continue home regimen   Depression/anxiety/?Dementia Continue home regimen Continue Aricept         Malnutrition Type:      Malnutrition Characteristics:      Nutrition Interventions:       Estimated body mass index is 18.11 kg/m as calculated from the following:   Height as of this encounter: 5\' 2"  (1.575 m).   Weight as of this encounter: 44.9 kg.     Code Status: DNR  Family Communication: None at bedside  Disposition Plan: To be determined   Consultants:  None  Procedures:  None  Antimicrobials:  Zosyn  DVT prophylaxis: SCDs-until hemoglobin back to baseline   Objective: Vitals:   06/09/19 1031 06/09/19 1103 06/09/19 1147 06/09/19 1407  BP: (!) 171/102 (!) 146/100 (!) 170/94 (!) 157/97  Pulse: 85 85  88  Resp:    16  Temp:    97.8 F (36.6 C)  TempSrc:    Oral  SpO2:    100%  Weight:      Height:        Intake/Output Summary (Last 24 hours) at 06/09/2019 1544 Last data filed at 06/09/2019 1200 Gross per 24 hour  Intake 1751.64 ml  Output -  Net 1751.64 ml   Filed Weights   06/07/19 1000  Weight: 44.9 kg    Exam:  General: NAD   Cardiovascular: S1, S2 present  Respiratory: CTAB  Abdomen: Soft, LLQ tenderness, nondistended, bowel sounds present  Musculoskeletal: No bilateral pedal edema noted  Skin: Normal  Psychiatry: Normal mood   Data Reviewed: CBC: Recent Labs  Lab 06/06/19 2106 06/08/19 0254 06/09/19 0448  WBC 12.8* 10.9* 9.2  NEUTROABS  --   --  6.4  HGB 10.0* 7.9* 7.6*  HCT 31.4* 24.3* 23.1*  MCV 92.4 89.3 89.9  PLT 563* 442* 956*   Basic Metabolic Panel: Recent  Labs  Lab 06/06/19 2106 06/08/19 0254 06/09/19 0448  NA 135 131* 136  K 5.3* 5.6* 4.8  CL 114* 114* 116*  CO2 9* 8* 10*  GLUCOSE 106* 122* 93  BUN 46* 45* 35*  CREATININE 2.12* 2.22* 2.10*  CALCIUM 10.1 8.7* 8.5*   GFR: Estimated Creatinine Clearance: 16.4 mL/min (A) (by C-G formula based on SCr of 2.1 mg/dL (H)). Liver Function Tests: Recent Labs   Lab 06/06/19 2106  AST 18  ALT 13  ALKPHOS 86  BILITOT 0.5  PROT 7.8  ALBUMIN 3.9   Recent Labs  Lab 06/06/19 2106  LIPASE 32   No results for input(s): AMMONIA in the last 168 hours. Coagulation Profile: No results for input(s): INR, PROTIME in the last 168 hours. Cardiac Enzymes: No results for input(s): CKTOTAL, CKMB, CKMBINDEX, TROPONINI in the last 168 hours. BNP (last 3 results) No results for input(s): PROBNP in the last 8760 hours. HbA1C: No results for input(s): HGBA1C in the last 72 hours. CBG: No results for input(s): GLUCAP in the last 168 hours. Lipid Profile: No results for input(s): CHOL, HDL, LDLCALC, TRIG, CHOLHDL, LDLDIRECT in the last 72 hours. Thyroid Function Tests: No results for input(s): TSH, T4TOTAL, FREET4, T3FREE, THYROIDAB in the last 72 hours. Anemia Panel: No results for input(s): VITAMINB12, FOLATE, FERRITIN, TIBC, IRON, RETICCTPCT in the last 72 hours. Urine analysis:    Component Value Date/Time   COLORURINE YELLOW 06/06/2019 2115   APPEARANCEUR CLEAR 06/06/2019 2115   LABSPEC 1.013 06/06/2019 2115   PHURINE 5.0 06/06/2019 2115   GLUCOSEU NEGATIVE 06/06/2019 2115   HGBUR NEGATIVE 06/06/2019 2115   BILIRUBINUR NEGATIVE 06/06/2019 2115   KETONESUR NEGATIVE 06/06/2019 2115   PROTEINUR 100 (A) 06/06/2019 2115   UROBILINOGEN 0.2 08/24/2015 1737   NITRITE NEGATIVE 06/06/2019 2115   LEUKOCYTESUR NEGATIVE 06/06/2019 2115   Sepsis Labs: @LABRCNTIP (procalcitonin:4,lacticidven:4)  ) Recent Results (from the past 240 hour(s))  SARS CORONAVIRUS 2 Nasal Swab Aptima Multi Swab     Status: None   Collection Time: 06/07/19  5:30 AM   Specimen: Aptima Multi Swab; Nasal Swab  Result Value Ref Range Status   SARS Coronavirus 2 NEGATIVE NEGATIVE Final    Comment: (NOTE) SARS-CoV-2 target nucleic acids are NOT DETECTED. The SARS-CoV-2 RNA is generally detectable in upper and lower respiratory specimens during the acute phase of infection.  Negative results do not preclude SARS-CoV-2 infection, do not rule out co-infections with other pathogens, and should not be used as the sole basis for treatment or other patient management decisions. Negative results must be combined with clinical observations, patient history, and epidemiological information. The expected result is Negative. Fact Sheet for Patients: SugarRoll.be Fact Sheet for Healthcare Providers: https://www.woods-mathews.com/ This test is not yet approved or cleared by the [place] FDA and  has been authorized for detection and/or diagnosis of SARS-CoV-2 by FDA under an Emergency Use Authorization (EUA). This EUA will remain  in effect (meaning this test can be used) for the duration of the COVID-19 declaration under Section 56 4(b)(1) of the Act, 21 U.S.C. section 360bbb-3(b)(1), unless the authorization is terminated or revoked sooner. Performed at [facility], [place] [address], [place], [place] [address]   Culture, blood (routine x 2)     Status: None (Preliminary result)   Collection Time: 06/08/19 11:19 AM   Specimen: BLOOD RIGHT HAND  Result Value Ref Range Status   Specimen Description BLOOD RIGHT HAND  Final   Special Requests   Final  BOTTLES DRAWN AEROBIC ONLY Blood Culture adequate volume   Culture   Final    NO GROWTH < 24 HOURS Performed at [facility], [place] [address], [place], [place] [address]    Report Status PENDING  Incomplete  Culture, blood (routine x 2)     Status: None (Preliminary result)   Collection Time: 06/08/19 11:24 AM   Specimen: BLOOD  Result Value Ref Range Status   Specimen Description BLOOD LEFT ANTECUBITAL  Final   Special Requests   Final    BOTTLES DRAWN AEROBIC ONLY Blood Culture adequate volume   Culture   Final    NO GROWTH < 24 HOURS Performed at [facility], [place] [address], [place], [place] [address]    Report Status PENDING  Incomplete       Studies: No results found.  Scheduled Meds: . citalopram  20 mg Oral Daily  . clonazePAM  0.5 mg Oral QHS  . donepezil  5 mg Oral QHS  . ferrous sulfate  325 mg Oral Q breakfast  . gabapentin  100 mg Oral TID  . hydrALAZINE  25 mg Oral TID  . isosorbide mononitrate  60 mg Oral Daily  . metoprolol tartrate  12.5 mg Oral BID  . rosuvastatin  40 mg Oral QHS  . sodium bicarbonate  650 mg Oral TID  . valproic acid  250 mg Oral BID    Continuous Infusions: . sodium chloride 50 mL/hr at 06/09/19 1200  . piperacillin-tazobactam (ZOSYN)  IV 12.5 mL/hr at 06/09/19 1200     LOS: 1 day     Alma Friendly, MD [facility]  If 7PM-7AM, please contact night-coverage [email or web address] 06/09/2019, 3:44 PM

## 2019-06-09 NOTE — Progress Notes (Signed)
Initial Nutrition Assessment  DOCUMENTATION CODES:   Not applicable  INTERVENTION:  -Ensure Enlive po BID, each supplement provides 350 kcal and 20 grams of protein Magic cup TID with meals, each supplement provides 290 kcal and 9 grams of protein -MVI daily  -Advance diet as tolerated  NUTRITION DIAGNOSIS:   Inadequate oral intake related to altered GI function, poor appetite(diverticultis) as evidenced by meal completion < 50%.   GOAL:   Patient will meet greater than or equal to 90% of their needs   MONITOR:   PO intake, Weight trends, Supplement acceptance, Diet advancement, I & O's, Labs  REASON FOR ASSESSMENT:   Consult Assessment of nutrition requirement/status  ASSESSMENT:  RD working remotely.  78 year old female with medical history significant of PVD, HTN, HLD, CKD who presented to ED with abdominal pain and 1 episode of black runny BM; CT revealed distal colonic thickening consistent; admitted with diverticulitis  Unable to reach patient via phone today Per chart review, pt continues with complaints of significant left lower quadrant abdominal pain, denies any n/v diarrhea today.  Patient eating 0% of last 2 meals on FL diet 8/21 - Diet advanced to soft 0-75%; return to [facility] order due to increased abdominal pain s/p diet advancement 8/20 - CL - 100%  RD to order Ensure to assist with calorie and protein needs; continue to monitor for meal intake and diet advancement  Current wt 44.9 kg (98.9 lb) Wt history reviewed - stable  Medications reviewed and include: ferrous sulfate, gabapentin, celexa, klonopin, crestor, valproic acid, zosyn, hydrocodone  Labs: Hemoglobin 7.6 - trending down  Intake/Output Summary (Last 24 hours) at 06/09/2019 1800 Last data filed at 06/09/2019 1200 Gross per 24 hour  Intake 1515.16 ml  Output -  Net 1515.16 ml     NUTRITION - FOCUSED PHYSICAL EXAM:    Diet Order:   Diet Order            Diet full liquid Room  service appropriate? Yes; Fluid consistency: Thin  Diet effective now              EDUCATION NEEDS:   No education needs have been identified at this time  Skin:  Skin Assessment: Reviewed RN Assessment  Last BM:  8/19  Height:   Ht Readings from Last 1 Encounters:  06/07/19 5\' 2"  (1.575 m)    Weight:   Wt Readings from Last 1 Encounters:  06/07/19 44.9 kg    Ideal Body Weight:  50 kg  BMI:  Body mass index is 18.11 kg/m.  Estimated Nutritional Needs:   Kcal:  1300-1500  Protein:  65-75  Fluid:  >1.3L   Lajuan Lines, RD, LDN [employer] Telephone [phone] After Hours/Weekend Pager: [phone]

## 2019-06-10 LAB — CBC WITH DIFFERENTIAL/PLATELET
Abs Immature Granulocytes: 0.05 10*3/uL (ref 0.00–0.07)
Basophils Absolute: 0.1 10*3/uL (ref 0.0–0.1)
Basophils Relative: 1 %
Eosinophils Absolute: 0.5 10*3/uL (ref 0.0–0.5)
Eosinophils Relative: 5 %
HCT: 25.3 % — ABNORMAL LOW (ref 36.0–46.0)
Hemoglobin: 8.2 g/dL — ABNORMAL LOW (ref 12.0–15.0)
Immature Granulocytes: 1 %
Lymphocytes Relative: 14 %
Lymphs Abs: 1.3 10*3/uL (ref 0.7–4.0)
MCH: 29.4 pg (ref 26.0–34.0)
MCHC: 32.4 g/dL (ref 30.0–36.0)
MCV: 90.7 fL (ref 80.0–100.0)
Monocytes Absolute: 0.8 10*3/uL (ref 0.1–1.0)
Monocytes Relative: 9 %
Neutro Abs: 6.4 10*3/uL (ref 1.7–7.7)
Neutrophils Relative %: 70 %
Platelets: 480 10*3/uL — ABNORMAL HIGH (ref 150–400)
RBC: 2.79 MIL/uL — ABNORMAL LOW (ref 3.87–5.11)
RDW: 14.2 % (ref 11.5–15.5)
WBC: 9.1 10*3/uL (ref 4.0–10.5)
nRBC: 0 % (ref 0.0–0.2)

## 2019-06-10 LAB — BASIC METABOLIC PANEL
Anion gap: 8 (ref 5–15)
BUN: 28 mg/dL — ABNORMAL HIGH (ref 8–23)
CO2: 11 mmol/L — ABNORMAL LOW (ref 22–32)
Calcium: 8.6 mg/dL — ABNORMAL LOW (ref 8.9–10.3)
Chloride: 118 mmol/L — ABNORMAL HIGH (ref 98–111)
Creatinine, Ser: 1.71 mg/dL — ABNORMAL HIGH (ref 0.44–1.00)
GFR calc Af Amer: 33 mL/min — ABNORMAL LOW (ref >60)
GFR calc non Af Amer: 29 mL/min — ABNORMAL LOW (ref >60)
Glucose, Bld: 85 mg/dL (ref 70–99)
Potassium: 4.6 mmol/L (ref 3.5–5.1)
Sodium: 137 mmol/L (ref 135–145)

## 2019-06-10 LAB — OCCULT BLOOD X 1 CARD TO LAB, STOOL: Fecal Occult Bld: POSITIVE — AB

## 2019-06-10 MED ORDER — SODIUM BICARBONATE 650 MG PO TABS
1300.0000 mg | ORAL_TABLET | Freq: Two times a day (BID) | ORAL | Status: DC
  Administered 2019-06-10 – 2019-06-16 (×13): 1300 mg via ORAL
  Filled 2019-06-10 (×13): qty 2

## 2019-06-10 MED ORDER — HYDRALAZINE HCL 20 MG/ML IJ SOLN
10.0000 mg | INTRAMUSCULAR | Status: DC | PRN
  Administered 2019-06-11 – 2019-06-16 (×11): 10 mg via INTRAVENOUS
  Filled 2019-06-10 (×11): qty 1

## 2019-06-10 MED ORDER — PANTOPRAZOLE SODIUM 40 MG IV SOLR
40.0000 mg | Freq: Two times a day (BID) | INTRAVENOUS | Status: DC
  Administered 2019-06-10 (×2): 40 mg via INTRAVENOUS
  Filled 2019-06-10 (×2): qty 40

## 2019-06-10 NOTE — Progress Notes (Signed)
Pt hemoccult stool sample came back positive  Informed MD via secure chat, MD aware Will continue to monitor

## 2019-06-10 NOTE — Progress Notes (Signed)
PROGRESS NOTE  Alyssa [name] NOM:[id number] DOB: October 29, 1940 DOA: 06/06/2019 PCP: Everardo Beals, NP  HPI/Recap of past 42 hours: 78 year old female with medical history significant for PVD, hypertension, hyperlipidemia, CKD, presents to the ED complaining of left lower quadrant pain.  Patient denies any nausea/vomiting, fever.  CT abdomen showed diverticulitis with no complication.  Due to persistent abdominal pain patient admitted for further management.   Today, patient complained of diarrhea, as well as persistent left lower quadrant abdominal pain.  Patient denies any fever/chills, nausea/vomiting, chest pain, shortness of breath.     Assessment/Plan: Principal Problem:   Diverticulitis Active Problems:   Essential hypertension   Hyperlipidemia   CKD (chronic kidney disease) stage 4, GFR 15-29 ml/min (HCC)   Chronic pain syndrome   Depression with anxiety  Acute diverticulitis Currently afebrile with no leukocytosis BC x2 NGTD CT abdomen showed distal colonic wall thickening consistent with mild colitis.  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, colonoscopy in 2019 showed moderate amount of diverticulosis in sigmoid colon GI consulted Start Protonix Type and screen done, transfuse if hemoglobin less than 7 Daily CBC  ??AKI on CKD stage IV, with metabolic acidosis Baseline creatinine between 1.7- 2, although fluctuates Chronically acidotic, with bicarb around 8 Continue gentle hydration Continue bicarb tabs Daily BMP  Hyperkalemia Resolved S/P 1 dose of Veltassa Daily BMP   Hypertension BP elevated Continue Lopressor, Imdur, hydralazine, continue to hold bidil  Hyperlipidemia Continue Crestor  PVD Continue to hold aspirin, Plavix due to possible bleed  Chronic pain Continue home regimen  Depression/anxiety/?Dementia Continue home regimen Continue Aricept         Malnutrition Type:  Nutrition Problem: Inadequate oral intake Etiology: altered GI function, poor appetite(diverticultis)   Malnutrition Characteristics:  Signs/Symptoms: meal completion < 50%   Nutrition Interventions:  Interventions: Ensure Enlive (each supplement provides 350kcal and 20 grams of protein), Magic cup, MVI    Estimated body mass index is 18.11 kg/m as calculated from the following:   Height as of this encounter: 5\' 2"  (1.575 m).   Weight as of this encounter: 44.9 kg.     Code Status: DNR  Family Communication: None at bedside  Disposition Plan: Home once stable  Consultants:  GI  Procedures:  None  Antimicrobials:  Zosyn  DVT prophylaxis: SCDs   Objective: Vitals:   06/10/19 0759 06/10/19 0803 06/10/19 0944 06/10/19 1230  BP: (!) 186/109 (!) 166/100 (!) 154/98 (!) 149/97  Pulse: 95   94  Resp: 14   16  Temp: 98 F (36.7 C)   98.2 F (36.8 C)  TempSrc: Oral   Oral  SpO2: 100%   98%  Weight:      Height:       No intake or output data in the 24 hours ending 06/10/19 1331 Filed Weights   06/07/19 1000  Weight: 44.9 kg    Exam:  General: NAD   Cardiovascular: S1, S2 present  Respiratory: CTAB  Abdomen: Soft, LLQ tenderness, nondistended, bowel sounds present  Musculoskeletal: No bilateral pedal edema noted  Skin: Normal  Psychiatry: Normal mood   Data Reviewed: CBC: Recent Labs  Lab 06/06/19 2106 06/08/19 0254 06/09/19 0448 06/10/19 0441  WBC  12.8* 10.9* 9.2 9.1  NEUTROABS  --   --  6.4 6.4  HGB 10.0* 7.9* 7.6* 8.2*  HCT 31.4* 24.3* 23.1* 25.3*  MCV 92.4 89.3 89.9 90.7  PLT 563* 442* 428* 480*    Basic Metabolic Panel: Recent Labs  Lab 06/06/19 2106 06/08/19 0254 06/09/19 0448 06/10/19 0441  NA 135 131* 136 137  K 5.3* 5.6* 4.8 4.6  CL 114* 114* 116* 118*  CO2 9* 8* 10* 11*  GLUCOSE 106* 122* 93 85  BUN 46* 45* 35* 28*  CREATININE 2.12* 2.22* 2.10* 1.71*  CALCIUM 10.1 8.7* 8.5* 8.6*   GFR: Estimated Creatinine Clearance: 20.1 mL/min (A) (by C-G formula based on SCr of 1.71 mg/dL (H)). Liver Function Tests: Recent Labs  Lab 06/06/19 2106  AST 18  ALT 13  ALKPHOS 86  BILITOT 0.5  PROT 7.8  ALBUMIN 3.9   Recent Labs  Lab 06/06/19 2106  LIPASE 32   No results for input(s): AMMONIA in the last 168 hours. Coagulation Profile: No results for input(s): INR, PROTIME in the last 168 hours. Cardiac Enzymes: No results for input(s): CKTOTAL, CKMB, CKMBINDEX, TROPONINI in the last 168 hours. BNP (last 3 results) No results for input(s): PROBNP in the last 8760 hours. HbA1C: No results for input(s): HGBA1C in the last 72 hours. CBG: No results for input(s): GLUCAP in the last 168 hours. Lipid Profile: No results for input(s): CHOL, HDL, LDLCALC, TRIG, CHOLHDL, LDLDIRECT in the last 72 hours. Thyroid Function Tests: No results for input(s): TSH, T4TOTAL, FREET4, T3FREE, THYROIDAB in the last 72 hours. Anemia Panel: No results for input(s): VITAMINB12, FOLATE, FERRITIN, TIBC, IRON, RETICCTPCT in the last 72 hours. Urine analysis:    Component Value Date/Time   COLORURINE YELLOW 06/06/2019 2115   APPEARANCEUR CLEAR 06/06/2019 2115   LABSPEC 1.013 06/06/2019 2115   PHURINE 5.0 06/06/2019 2115   GLUCOSEU NEGATIVE 06/06/2019 2115   HGBUR NEGATIVE 06/06/2019 2115   BILIRUBINUR NEGATIVE 06/06/2019 2115   KETONESUR NEGATIVE 06/06/2019 2115   PROTEINUR 100 (A) 06/06/2019 2115   UROBILINOGEN 0.2 08/24/2015 1737   NITRITE NEGATIVE 06/06/2019 2115   LEUKOCYTESUR NEGATIVE 06/06/2019 2115   Sepsis Labs: @LABRCNTIP (procalcitonin:4,lacticidven:4)  ) Recent Results  (from the past 240 hour(s))  SARS CORONAVIRUS 2 Nasal Swab Aptima Multi Swab     Status: None   Collection Time: 06/07/19  5:30 AM   Specimen: Aptima Multi Swab; Nasal Swab  Result Value Ref Range Status   SARS Coronavirus 2 NEGATIVE NEGATIVE Final    Comment: (NOTE) SARS-CoV-2 target nucleic acids are NOT DETECTED. The SARS-CoV-2 RNA is generally detectable in upper and lower respiratory specimens during the acute phase of infection. Negative results do not preclude SARS-CoV-2 infection, do not rule out co-infections with other pathogens, and should not be used as the sole basis for treatment or other patient management decisions. Negative results must be combined with clinical observations, patient history, and epidemiological information. The expected result is Negative. Fact Sheet for Patients: SugarRoll.be Fact Sheet for Healthcare Providers: https://www.woods-mathews.com/ This test is not yet approved or cleared by the [place] FDA and  has been authorized for detection and/or diagnosis of SARS-CoV-2 by FDA under an Emergency Use Authorization (EUA). This EUA will remain  in effect (meaning this test can be used) for the duration of the COVID-19 declaration under Section 56 4(b)(1) of the Act, 21 U.S.C. section 360bbb-3(b)(1), unless the authorization is terminated or revoked sooner. Performed at [facility], [place] [address]  [place], [place] [address]   Culture, blood (routine x 2)     Status: None (Preliminary result)   Collection Time: 06/08/19 11:19 AM   Specimen: BLOOD RIGHT HAND  Result Value Ref Range Status   Specimen Description BLOOD RIGHT HAND  Final   Special Requests   Final    BOTTLES DRAWN AEROBIC ONLY Blood Culture adequate volume   Culture   Final    NO GROWTH 2 DAYS Performed at [facility], [address], [place], [place] [address]    Report Status PENDING  Incomplete  Culture, blood  (routine x 2)     Status: None (Preliminary result)   Collection Time: 06/08/19 11:24 AM   Specimen: BLOOD  Result Value Ref Range Status   Specimen Description BLOOD LEFT ANTECUBITAL  Final   Special Requests   Final    BOTTLES DRAWN AEROBIC ONLY Blood Culture adequate volume   Culture   Final    NO GROWTH 2 DAYS Performed at [facility], [place] [address] [place], [place] [address]    Report Status PENDING  Incomplete      Studies: No results found.  Scheduled Meds: . citalopram  20 mg Oral Daily  . clonazePAM  0.5 mg Oral QHS  . donepezil  5 mg Oral QHS  . feeding supplement (ENSURE ENLIVE)  237 mL Oral BID BM  . ferrous sulfate  325 mg Oral Q breakfast  . gabapentin  100 mg Oral TID  . hydrALAZINE  25 mg Oral TID  . isosorbide mononitrate  60 mg Oral Daily  . metoprolol tartrate  12.5 mg Oral BID  . multivitamin with minerals  1 tablet Oral Daily  . pantoprazole (PROTONIX) IV  40 mg Intravenous Q12H  . rosuvastatin  40 mg Oral QHS  . sodium bicarbonate  1,300 mg Oral BID  . valproic acid  250 mg Oral BID    Continuous Infusions: . sodium chloride 50 mL/hr at 06/09/19 2232  . piperacillin-tazobactam (ZOSYN)  IV 3.375 g (06/10/19 0942)     LOS: 2 days     Alma Friendly, MD [facility]  If 7PM-7AM, please contact night-coverage [email or web address] 06/10/2019, 1:31 PM

## 2019-06-10 NOTE — Consult Note (Signed)
CROSS COVER [facility] Reason for Consult: Left lower quadrant pain for 3 days. Referring Physician: THP  Alyssa [name] is an 78 y.o. female.  HPI: Alyssa [name] is a 78 year old black female with multiple medical problems listed below, admitted for left lower quadrant pain with evidence of mild colitis in the distal colon with wall thickening and without any evidence of perforation or abscess on a CT scan done on admission.  GI consultation was requested for further evaluation of the left lower quadrant pain.  Patient has a history of constant chronic constipation and can go 4 to 5 days without a bowel movement.  She denies having any melena or hematochezia, nausea vomiting fever chills or rigors.  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  On reviewing the patient's record she has had an EGD and a colonoscopy done by Dr. Fuller Plan in April of May 2019 when she was noted to have pandiverticulosis with more prominent changes in the sigmoid colon and with some luminal narrowing and spasm associated with the diverticulosis and a small polyp was removed from the transverse colon the polyp was resected but was not retrieved.  She also had an EGD that revealed a hiatal hernia LA grade C esophagitis and Barrett's mucosa on biopsies.  Patient's had some loose dark stools for the last few days but is on chronic iron supplementation for iron deficiency anemia.  Past Medical History:  Diagnosis Date  . Anxiety   . Arthritis   . Chest pain   . Chronic kidney disease   . Elevated troponin 12/13/2018  . Headache(784.0)   . Hyperlipidemia   . Hypertension   . Joint pain   . Leg pain   . Peripheral neuropathy   . Peripheral vascular disease [facility])    Past Surgical History:   Procedure Laterality Date  . ABDOMINAL ANGIOGRAM N/A 10/29/2011   Procedure: ABDOMINAL ANGIOGRAM;  Surgeon: Elam Dutch, MD;  Location: [facility] CATH LAB;  Service: Cardiovascular;  Laterality: N/A;  . BACK SURGERY    . FEMORAL-FEMORAL BYPASS GRAFT  08/31/2010  . JOINT REPLACEMENT Right 2012   Hip  . LOWER EXTREMITY ANGIOGRAPHY  06/14/2017   Procedure: Lower Extremity Angiography;  Surgeon: Adrian Prows, MD;  Location: [facility] CV LAB;  Service: Cardiovascular;;  Bilateral limited angio performed  . RENAL ANGIOGRAPHY N/A 06/14/2017   Procedure: Renal Angiography;  Surgeon: Adrian Prows, MD;  Location: [place] CV LAB;  Service: Cardiovascular;  Laterality: N/A;  . TOTAL HIP ARTHROPLASTY     right   Family History  Problem Relation Age of Onset  . Heart attack Mother   . Heart disease Mother   . Hyperlipidemia Mother   . Hypertension Mother   . Heart attack Father   . Heart disease Father        Before age 61  . Hyperlipidemia Father   . Hypertension Father   . Cancer Sister        brain tumor  . Aneurysm Brother        brain  . Colon cancer Neg Hx   . Liver cancer Neg Hx     Social History:  reports that she quit smoking about 6 months ago. Her smoking use included cigarettes. She has a  27.00 pack-year smoking history. She has never used smokeless tobacco. She reports that she does not drink alcohol or use drugs.  Allergies: No Known Allergies  Medications: I have reviewed the patient's current medications.  Results for orders placed or performed during the hospital encounter of 06/06/19 (from the past 48 hour(s))  CBC with Differential/Platelet     Status: Abnormal   Collection Time: 06/09/19  4:48 AM  Result Value Ref Range   WBC 9.2 4.0 - 10.5 K/uL   RBC 2.57 (L) 3.87 - 5.11 MIL/uL   Hemoglobin 7.6 (L) 12.0 - 15.0 g/dL   HCT 23.1 (L) 36.0 - 46.0 %   MCV 89.9 80.0 - 100.0 fL   MCH 29.6 26.0 - 34.0 pg   MCHC 32.9 30.0 - 36.0 g/dL   RDW 14.0 11.5 - 15.5 %    Platelets 428 (H) 150 - 400 K/uL   nRBC 0.0 0.0 - 0.2 %   Neutrophils Relative % 69 %   Neutro Abs 6.4 1.7 - 7.7 K/uL   Lymphocytes Relative 15 %   Lymphs Abs 1.4 0.7 - 4.0 K/uL   Monocytes Relative 9 %   Monocytes Absolute 0.9 0.1 - 1.0 K/uL   Eosinophils Relative 6 %   Eosinophils Absolute 0.6 (H) 0.0 - 0.5 K/uL   Basophils Relative 1 %   Basophils Absolute 0.1 0.0 - 0.1 K/uL   Immature Granulocytes 0 %   Abs Immature Granulocytes 0.03 0.00 - 0.07 K/uL    Comment: Performed at [facility], [address], [place], [place] [address]  Basic metabolic panel     Status: Abnormal   Collection Time: 06/09/19  4:48 AM  Result Value Ref Range   Sodium 136 135 - 145 mmol/L   Potassium 4.8 3.5 - 5.1 mmol/L   Chloride 116 (H) 98 - 111 mmol/L   CO2 10 (L) 22 - 32 mmol/L   Glucose, Bld 93 70 - 99 mg/dL   BUN 35 (H) 8 - 23 mg/dL   Creatinine, Ser 2.10 (H) 0.44 - 1.00 mg/dL   Calcium 8.5 (L) 8.9 - 10.3 mg/dL   GFR calc non Af Amer 22 (L) >60 mL/min   GFR calc Af Amer 26 (L) >60 mL/min   Anion gap 10 5 - 15    Comment: Performed at [facility] [address], [place], [place] [address]  CBC with Differential/Platelet     Status: Abnormal   Collection Time: 06/10/19  4:41 AM  Result Value Ref Range   WBC 9.1 4.0 - 10.5 K/uL   RBC 2.79 (L) 3.87 - 5.11 MIL/uL   Hemoglobin 8.2 (L) 12.0 - 15.0 g/dL   HCT 25.3 (L) 36.0 - 46.0 %   MCV 90.7 80.0 - 100.0 fL   MCH 29.4 26.0 - 34.0 pg   MCHC 32.4 30.0 - 36.0 g/dL   RDW 14.2 11.5 - 15.5 %   Platelets 480 (H) 150 - 400 K/uL   nRBC 0.0 0.0 - 0.2 %   Neutrophils Relative % 70 %   Neutro Abs 6.4 1.7 - 7.7 K/uL   Lymphocytes Relative 14 %   Lymphs Abs 1.3 0.7 - 4.0 K/uL   Monocytes Relative 9 %   Monocytes Absolute 0.8 0.1 - 1.0 K/uL   Eosinophils Relative 5 %   Eosinophils Absolute 0.5 0.0 - 0.5 K/uL   Basophils Relative 1 %   Basophils Absolute 0.1 0.0 - 0.1 K/uL   Immature Granulocytes 1 %  Abs Immature Granulocytes 0.05  0.00 - 0.07 K/uL    Comment: Performed at [facility], [place] [address], [place], [place] [address]  Basic metabolic panel     Status: Abnormal   Collection Time: 06/10/19  4:41 AM  Result Value Ref Range   Sodium 137 135 - 145 mmol/L   Potassium 4.6 3.5 - 5.1 mmol/L   Chloride 118 (H) 98 - 111 mmol/L   CO2 11 (L) 22 - 32 mmol/L   Glucose, Bld 85 70 - 99 mg/dL   BUN 28 (H) 8 - 23 mg/dL   Creatinine, Ser 1.71 (H) 0.44 - 1.00 mg/dL   Calcium 8.6 (L) 8.9 - 10.3 mg/dL   GFR calc non Af Amer 29 (L) >60 mL/min   GFR calc Af Amer 33 (L) >60 mL/min   Anion gap 8 5 - 15    Comment: Performed at [facility] [address] [place], [place] [address]  Occult blood card to lab, stool RN will collect     Status: Abnormal   Collection Time: 06/10/19  8:32 AM  Result Value Ref Range   Fecal Occult Bld POSITIVE (A) NEGATIVE    Comment: Performed at [facility] [address] [place], [place] [address]  Review of Systems  Constitutional: Negative.   HENT: Negative.   Eyes: Negative.   Respiratory: Negative.   Cardiovascular: Negative.   Gastrointestinal: Positive for abdominal pain, constipation and heartburn. Negative for blood in stool, diarrhea, melena, nausea and vomiting.  Musculoskeletal: Positive for back pain.  Skin: Negative.   Endo/Heme/Allergies: Negative.   Psychiatric/Behavioral: Negative.    Blood pressure (!) 149/97, pulse 94, temperature 98.2 F (36.8 C), temperature source Oral, resp. rate 16, height 5\' 2"  (1.575 m), weight 44.9 kg, SpO2 98 %. Physical Exam  Constitutional: She is oriented to person, place, and time. She appears well-developed and well-nourished.  HENT:  Head: Normocephalic and atraumatic.  Eyes: EOM are normal.  Neck: Normal range of motion. Neck supple.  Cardiovascular: Normal rate and regular rhythm.  Respiratory: Effort normal and breath sounds normal.  GI: Soft. Bowel sounds are normal. She exhibits no distension and no mass.  There is abdominal tenderness. There is no rebound and no guarding.  Neurological: She is alert and oriented to person, place, and time.  Skin: Skin is warm and dry.  Psychiatric: She has a normal mood and affect. Her behavior is normal. Judgment and thought content normal.   Assessment/Plan: 1) Acute diverticulitis on CT scan done on 06/07/2019/diverticulosis on a colonoscopy done in April 2019--on IV Zosyn.  Once stool sample has been sent for C. difficile but patient has not had any further bowel movement since this morning.  I do not feel that the patient needs any interventional procedures at this time as she had a complete work-up last year done by Dr. Fuller Plan. 2) Hiatal hernia/GERD/esophagitis and a previous endoscopy done last year-continue PPIs. 3) Barrett's esophagus and colonic polyps 4) Chronic iron deficiencyanemia.  5) Chronic kidney disease. 6) History of depression and anxiety. Dr. Fuller Plan is to resume care tomorrow. Juanita Craver 06/10/2019, 1:15 PM

## 2019-06-10 NOTE — Plan of Care (Signed)

## 2019-06-10 NOTE — Evaluation (Signed)
Physical Therapy Evaluation Patient Details Name: Alyssa [name] MRN: [id number] DOB: 1941-09-13 Today's Date: 06/10/2019   History of Present Illness  Patient is a 78 y/o female who presents with LLQ pain and left hip pain. Found to have diverticulitis. PMH includes CKD, HTN, PVD, HLD.  Clinical Impression  Patient presents with generalized weakness, mild balance deficits, pain and impaired mobility s/p above. Pt Mod I PTA, uses RW at baseline and lives with brother. Today, pt tolerated transfers, ADL tasks and gait training with min guard assist for balance/safety. 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 Pt complaining of diarrhea and incontinent of stool during mobility. Encouraged increasing activity while in the hospital. Will follow acutely to maximize independence and mobility prior to return home.     Follow Up Recommendations No PT follow up;Supervision - Intermittent    Equipment Recommendations  None recommended by PT    Recommendations for Other Services       Precautions / Restrictions Restrictions Weight Bearing Restrictions: No      Mobility  Bed Mobility Overal bed mobility: Modified Independent             General bed mobility comments: Use of rail, HOB elevated.  Transfers Overall transfer level: Needs assistance Equipment used: Rolling walker (2 wheeled) Transfers: Sit to/from Stand Sit to Stand: Min guard         General transfer comment: Min guard for safety. Stood from [employer], from toilet x1, transferred to chair post ambulation.  Ambulation/Gait Ambulation/Gait assistance: Min guard Gait Distance (Feet): 300 Feet Assistive device: Rolling walker (2 wheeled) Gait Pattern/deviations: Step-through pattern;Decreased stride length;Decreased dorsiflexion - right;Trunk flexed Gait velocity: decreased   General Gait Details: Slow, mostly steady gait with toe walking on RLE due to leg length discrepancy (does not have lift  here). Incontinent of stool during walk.  Stairs            Wheelchair Mobility    Modified Rankin (Stroke Patients Only)       Balance Overall balance assessment: Needs assistance Sitting-balance support: Feet supported;No upper extremity supported Sitting balance-Leahy Scale: Good Sitting balance - Comments: Able to lean outside BoS to perform pericare without LOB.   Standing balance support: During functional activity Standing balance-Leahy Scale: Fair Standing balance comment: Able to stand at sink and wash hands without difficulty or LOB.                             Pertinent Vitals/Pain Pain Assessment: Faces Faces Pain Scale: Hurts little more Pain Location: abdomen Pain Descriptors / Indicators: Aching Pain Intervention(s): Repositioned;Monitored during session    Home Living Family/patient expects to be discharged to:: Private residence Living Arrangements: Other relatives   Type of Home: Apartment Home Access: Stairs to enter Entrance Stairs-Rails: Psychiatric nurse of Steps: 3 Home Layout: One level Home Equipment: Environmental consultant - 2 wheels;Walker - 4 wheels;Bedside commode      Prior Function Level of Independence: Needs assistance   Gait / Transfers Assistance Needed: pt ambulates with a RW  ADL's / Homemaking Assistance Needed: Does her own ADLs per report. BRother drives and does the IADLS.        Hand Dominance   Dominant Hand: Right    Extremity/Trunk Assessment   Upper Extremity Assessment Upper Extremity Assessment: Defer to OT evaluation    Lower Extremity Assessment Lower Extremity Assessment: RLE deficits/detail RLE Deficits / Details: Leg length discrepancy,  RLE is shorter then LLE, has a brace whcih is not present in the hospital    Cervical / Trunk Assessment Cervical / Trunk Assessment: Kyphotic  Communication   Communication: HOH  Cognition Arousal/Alertness: Awake/alert Behavior During Therapy:  WFL for tasks assessed/performed Overall Cognitive Status: Within Functional Limits for tasks assessed                                 General Comments: for basic mobility tasks.      General Comments General comments (skin integrity, edema, etc.): Pt reports diarrhea.    Exercises     Assessment/Plan    PT Assessment Patient needs continued PT services  PT Problem List Decreased strength;Decreased mobility;Pain;Decreased balance       PT Treatment Interventions Therapeutic activities;Therapeutic exercise;Gait training;Patient/family education;Balance training;Functional mobility training;Stair training    PT Goals (Current goals can be found in the Care Plan section)  Acute Rehab PT Goals Patient Stated Goal: to get better and go home PT Goal Formulation: With patient Time For Goal Achievement: 06/24/19 Potential to Achieve Goals: Good    Frequency Min 3X/week   Barriers to discharge        Co-evaluation               AM-PAC PT "6 Clicks" Mobility  Outcome Measure Help needed turning from your back to your side while in a flat bed without using bedrails?: A Little Help needed moving from lying on your back to sitting on the side of a flat bed without using bedrails?: A Little Help needed moving to and from a bed to a chair (including a wheelchair)?: A Little Help needed standing up from a chair using your arms (e.g., wheelchair or bedside chair)?: A Little Help needed to walk in hospital room?: A Little Help needed climbing 3-5 steps with a railing? : A Little 6 Click Score: 18    End of Session Equipment Utilized During Treatment: Gait belt Activity Tolerance: Patient tolerated treatment well Patient left: in chair;with call bell/phone within reach;with chair alarm set Nurse Communication: Mobility status PT Visit Diagnosis: Unsteadiness on feet (R26.81)    Time: 8416-6063 PT Time Calculation (min) (ACUTE ONLY): 23 min   Charges:   PT  Evaluation $PT Eval Moderate Complexity: 1 Mod PT Treatments $Gait Training: 8-22 mins        Wray Kearns, PT, DPT Acute Rehabilitation Services Pager [phone] Office [place] 06/10/2019, 12:02 PM

## 2019-06-11 DIAGNOSIS — R933 Abnormal findings on diagnostic imaging of other parts of digestive tract: Secondary | ICD-10-CM

## 2019-06-11 LAB — CBC WITH DIFFERENTIAL/PLATELET
Abs Immature Granulocytes: 0.04 10*3/uL (ref 0.00–0.07)
Abs Immature Granulocytes: 0.03 10*3/uL (ref 0.00–0.07)
Basophils Absolute: 0.1 10*3/uL (ref 0.0–0.1)
Basophils Absolute: 0.1 10*3/uL (ref 0.0–0.1)
Basophils Relative: 1 %
Basophils Relative: 1 %
Eosinophils Absolute: 0.4 10*3/uL (ref 0.0–0.5)
Eosinophils Absolute: 0.3 10*3/uL (ref 0.0–0.5)
Eosinophils Relative: 4 %
Eosinophils Relative: 3 %
HCT: 24.3 % — ABNORMAL LOW (ref 36.0–46.0)
HCT: 27.7 % — ABNORMAL LOW (ref 36.0–46.0)
Hemoglobin: 8.2 g/dL — ABNORMAL LOW (ref 12.0–15.0)
Hemoglobin: 9.2 g/dL — ABNORMAL LOW (ref 12.0–15.0)
Immature Granulocytes: 0 %
Immature Granulocytes: 0 %
Lymphocytes Relative: 16 %
Lymphocytes Relative: 13 %
Lymphs Abs: 1.5 10*3/uL (ref 0.7–4.0)
Lymphs Abs: 1.5 10*3/uL (ref 0.7–4.0)
MCH: 29.5 pg (ref 26.0–34.0)
MCH: 29.1 pg (ref 26.0–34.0)
MCHC: 33.7 g/dL (ref 30.0–36.0)
MCHC: 33.2 g/dL (ref 30.0–36.0)
MCV: 87.4 fL (ref 80.0–100.0)
MCV: 87.7 fL (ref 80.0–100.0)
Monocytes Absolute: 0.8 10*3/uL (ref 0.1–1.0)
Monocytes Absolute: 0.8 10*3/uL (ref 0.1–1.0)
Monocytes Relative: 9 %
Monocytes Relative: 7 %
Neutro Abs: 6.6 10*3/uL (ref 1.7–7.7)
Neutro Abs: 8.4 10*3/uL — ABNORMAL HIGH (ref 1.7–7.7)
Neutrophils Relative %: 70 %
Neutrophils Relative %: 76 %
Platelets: 464 10*3/uL — ABNORMAL HIGH (ref 150–400)
Platelets: 501 10*3/uL — ABNORMAL HIGH (ref 150–400)
RBC: 2.78 MIL/uL — ABNORMAL LOW (ref 3.87–5.11)
RBC: 3.16 MIL/uL — ABNORMAL LOW (ref 3.87–5.11)
RDW: 13.9 % (ref 11.5–15.5)
RDW: 14.1 % (ref 11.5–15.5)
WBC: 9.4 10*3/uL (ref 4.0–10.5)
WBC: 11 10*3/uL — ABNORMAL HIGH (ref 4.0–10.5)
nRBC: 0 % (ref 0.0–0.2)
nRBC: 0 % (ref 0.0–0.2)

## 2019-06-11 LAB — BASIC METABOLIC PANEL
Anion gap: 9 (ref 5–15)
BUN: 23 mg/dL (ref 8–23)
CO2: 13 mmol/L — ABNORMAL LOW (ref 22–32)
Calcium: 8.7 mg/dL — ABNORMAL LOW (ref 8.9–10.3)
Chloride: 117 mmol/L — ABNORMAL HIGH (ref 98–111)
Creatinine, Ser: 1.63 mg/dL — ABNORMAL HIGH (ref 0.44–1.00)
GFR calc Af Amer: 35 mL/min — ABNORMAL LOW (ref >60)
GFR calc non Af Amer: 31 mL/min — ABNORMAL LOW (ref >60)
Glucose, Bld: 96 mg/dL (ref 70–99)
Potassium: 4 mmol/L (ref 3.5–5.1)
Sodium: 139 mmol/L (ref 135–145)

## 2019-06-11 LAB — GASTROINTESTINAL PANEL BY PCR, STOOL (REPLACES STOOL CULTURE)

## 2019-06-11 MED ORDER — PANTOPRAZOLE SODIUM 40 MG PO TBEC
40.0000 mg | DELAYED_RELEASE_TABLET | Freq: Every day | ORAL | Status: DC
  Administered 2019-06-11 – 2019-06-13 (×3): 40 mg via ORAL
  Filled 2019-06-11 (×3): qty 1

## 2019-06-11 MED ORDER — BUTALBITAL-APAP-CAFFEINE 50-325-40 MG PO TABS
1.0000 | ORAL_TABLET | Freq: Once | ORAL | Status: AC
  Administered 2019-06-11: 1 via ORAL
  Filled 2019-06-11: qty 1

## 2019-06-11 MED ORDER — ISOSORB DINITRATE-HYDRALAZINE 20-37.5 MG PO TABS
2.0000 | ORAL_TABLET | Freq: Three times a day (TID) | ORAL | Status: DC
  Administered 2019-06-11 – 2019-06-16 (×16): 2 via ORAL
  Filled 2019-06-11 (×20): qty 2

## 2019-06-11 MED ORDER — ISOSORB DINITRATE-HYDRALAZINE 20-37.5 MG PO TABS: 2.0000 | ORAL_TABLET | Freq: Three times a day (TID) | ORAL | Status: DC

## 2019-06-11 MED ORDER — PIPERACILLIN-TAZOBACTAM 3.375 G IVPB
3.3750 g | Freq: Three times a day (TID) | INTRAVENOUS | Status: DC
  Administered 2019-06-11 – 2019-06-16 (×15): 3.375 g via INTRAVENOUS
  Filled 2019-06-11 (×14): qty 50

## 2019-06-11 MED ORDER — PANTOPRAZOLE SODIUM 40 MG PO TBEC: 40.0000 mg | DELAYED_RELEASE_TABLET | Freq: Two times a day (BID) | ORAL | Status: DC

## 2019-06-11 NOTE — Progress Notes (Signed)
PHARMACY NOTE:  ANTIMICROBIAL RENAL DOSAGE ADJUSTMENT  Current antimicrobial regimen includes a mismatch between antimicrobial dosage and estimated renal function.  As per policy approved by the Pharmacy & Therapeutics and Medical Executive Committees, the antimicrobial dosage will be adjusted accordingly.  Current antimicrobial dosage:  Zosyn 3.375gm IV q12h  Indication: Intraabdominal infection  Renal Function:  Estimated Creatinine Clearance: 21.1 mL/min (A) (by C-G formula based on SCr of 1.63 mg/dL (H)). []      On intermittent HD, scheduled: []      On CRRT    Antimicrobial dosage has been changed to:  Zosyn 3.375gm IV q8h  Additional comments:   Mackson Botz A. Levada Dy, PharmD, BCPS, FNKF Clinical Pharmacist [place] Please utilize Amion for appropriate phone number to reach the unit pharmacist ([place])   06/11/2019 2:00 PM

## 2019-06-11 NOTE — Progress Notes (Addendum)
Daily Rounding Note  06/11/2019, 9:13 AM  LOS: 3 days   SUBJECTIVE:   Chief complaint:  Left sided colitis vs diverticulitis.   Continues loose, hard to control stools.  These are not visibly bloody.  She had 2 overnight, one just now which was greenish, dark, loose.  Left-sided abdominal pain persists though somewhat improved. Not currently nauseated, tolerating clears.  OBJECTIVE:         Vital signs in last 24 hours:    Temp:  [97.8 F (36.6 C)-98.2 F (36.8 C)] 97.8 F (36.6 C) (08/24 0431) Pulse Rate:  [78-98] 92 (08/24 0659) Resp:  [16-19] 17 (08/24 0431) BP: (149-192)/(97-111) 166/99 (08/24 0659) SpO2:  [98 %-99 %] 98 % (08/24 0431) Last BM Date: 06/10/19 Filed Weights   06/07/19 1000  Weight: 44.9 kg   General: Frail, comfortable, alert.  Does not appear toxic. Heart: RRR. Chest: Diminished breath sounds but clear.  No cough or labored breathing. Abdomen: Soft.  Slight left-sided tenderness without guarding or rebound.  Bowel sounds active. Extremities: No CCE. Neuro/Psych: Pleasant, cooperative, calm.  Not overtly confused.  Intake/Output from previous day: 08/23 0701 - 08/24 0700 In: 2129.7 [P.O.:600; I.V.:1414.2; IV Piggyback:115.5] Out: -   Intake/Output this shift: No intake/output data recorded.  Lab Results: Recent Labs    06/09/19 0448 06/10/19 0441 06/11/19 0334  WBC 9.2 9.1 9.4  HGB 7.6* 8.2* 8.2*  HCT 23.1* 25.3* 24.3*  PLT 428* 480* 464*   BMET Recent Labs    06/09/19 0448 06/10/19 0441 06/11/19 0334  NA 136 137 139  K 4.8 4.6 4.0  CL 116* 118* 117*  CO2 10* 11* 13*  GLUCOSE 93 85 96  BUN 35* 28* 23  CREATININE 2.10* 1.71* 1.63*  CALCIUM 8.5* 8.6* 8.7*     Scheduled Meds: . citalopram  20 mg Oral Daily  . clonazePAM  0.5 mg Oral QHS  . donepezil  5 mg Oral QHS  . feeding supplement (ENSURE ENLIVE)  237 mL Oral BID BM  . ferrous sulfate  325 mg Oral Q breakfast  .  gabapentin  100 mg Oral TID  . hydrALAZINE  25 mg Oral TID  . isosorbide mononitrate  60 mg Oral Daily  . isosorbide-hydrALAZINE  2 tablet Oral TID  . metoprolol tartrate  12.5 mg Oral BID  . multivitamin with minerals  1 tablet Oral Daily  . pantoprazole (PROTONIX) IV  40 mg Intravenous Q12H  . rosuvastatin  40 mg Oral QHS  . sodium bicarbonate  1,300 mg Oral BID  . valproic acid  250 mg Oral BID   Continuous Infusions: . piperacillin-tazobactam (ZOSYN)  IV 3.375 g (06/10/19 2110)   PRN Meds:.acetaminophen **OR** acetaminophen, busPIRone, hydrALAZINE, HYDROcodone-acetaminophen, ondansetron **OR** ondansetron (ZOFRAN) IV   ASSESMENT:   *    Distal colitis (ischemic vs infectious?) versus diverticulitis.  FOBT positive Day 4 Zosyn.  WBCs 12.8 >> 9.4.  No fevers.  CTAP without contrast showed distal colonic wall thickening consistent with mild colitis. Stool pathogen panel in process. Pandiverticulosis with luminal narrowing, spasm in the sigmoid, transverse colon polyp (sected, not retrieved) on colonoscopy 02/2018  *    Hiatal hernia with distal, likely reflux related, esophagitis per CT. 02/2018 EGD with grade C esophagitis, Barrett's mucosa. No PPI or H2 blocker on home med list.  Currently on IV Protonix.  *     Normocytic anemia.  On iron at home. Transfuse PRBCs 11/2018, 03/2019.  *  Chronic kidney disease.  *    Hypertension, not controlled.  *     Plavix, on hold.   PLAN   *    Continue current Zosyn.  *    If abdominal pain and diarrhea continue, consider flexible sigmoidoscopy.  *    Switch from clears to full liquids.   Switch to oral Protonix.  *    Await stool pathogen panel results.  [name]  06/11/2019, 9:13 AM Phone [phone]    Attending Physician Note   I have taken an interval history, reviewed the chart and examined the patient. I agree with the Advanced Practitioner's note, impression and recommendations.   Lucio Edward, MD [facility]

## 2019-06-11 NOTE — Progress Notes (Signed)
Discontinue enteric precautions per Dr. Horris Latino

## 2019-06-11 NOTE — Care Management Important Message (Signed)
Important Message  Patient Details  Name: Alyssa [name] MRN: [id number] Date of Birth: [birth year]   Medicare Important Message Given:  Yes     Memory Argue 06/11/2019, 2:44 PM   NO IM GIVEN TODAY DUE TO CONTACT PRECAUTION

## 2019-06-11 NOTE — Progress Notes (Signed)
PROGRESS NOTE  Alyssa [name] TMA:[id number] DOB: [birth year] DOA: 06/06/2019 PCP: Everardo Beals, NP  HPI/Recap of past 40 hours: 78 year old female with medical history significant for PVD, hypertension, hyperlipidemia, CKD, presents to the ED complaining of left lower quadrant pain.  Patient denies any nausea/vomiting, fever.  CT abdomen showed diverticulitis with no complication.  Due to persistent abdominal pain patient admitted for further management.    Today, patient noted to have multiple large loose BMs, darkish in color, with persistent left lower quadrant abdominal pain.  Denies any fever/chills, nausea/vomiting, chest pain, shortness of breath.   Assessment/Plan: Principal Problem:   Diverticulitis Active Problems:   Essential hypertension   Hyperlipidemia   CKD (chronic kidney disease) stage 4, GFR 15-29 ml/min (HCC)   Chronic pain syndrome   Depression with anxiety  Left sided colitis/diverticulitis Currently afebrile now with mild leukocytosis, diarrhea BC x2 NGTD GI panel pending CT abdomen showed distal colonic wall thickening consistent with mild colitis.  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, colonoscopy in 2019 showed moderate amount of diverticulosis in sigmoid colon GI consulted, appreciate recs Continue Protonix Type and screen done, transfuse if hemoglobin less than 7 Daily CBC  ??AKI on CKD stage IV, with metabolic acidosis Improving Baseline creatinine between 1.7- 2, although fluctuates ??Chronically acidotic Status post IV fluids Continue bicarb tabs Daily BMP  Hyperkalemia Resolved S/P 1 dose of [place] Daily  BMP  Hypertension BP elevated Continue Lopressor, bidil  Hyperlipidemia Continue Crestor  PVD Continue to hold aspirin and Plavix due to possible bleed/procedure  Chronic pain Continue home regimen  Depression/anxiety/?Dementia Continue home regimen Continue Aricept         Malnutrition Type:  Nutrition Problem: Inadequate oral intake Etiology: altered GI function, poor appetite(diverticultis)   Malnutrition Characteristics:  Signs/Symptoms: meal completion < 50%   Nutrition Interventions:  Interventions: Ensure Enlive (each supplement provides 350kcal and 20 grams of protein), Magic cup, MVI    Estimated body mass index is 18.11 kg/m as calculated from the following:   Height as of this encounter: 5\' 2"  (1.575 m).   Weight as of this encounter: 44.9 kg.     Code Status: DNR  Family Communication: None at bedside  Disposition Plan: Home once stable  Consultants:  GI  Procedures:  None  Antimicrobials:  Zosyn  DVT prophylaxis: SCDs   Objective: Vitals:   06/11/19 0431 06/11/19 0659 06/11/19 0931 06/11/19 1206  BP: (!) 185/105 (!) 166/99 (!) 176/120 (!) 169/99  Pulse: 78 92 (!) 107 89  Resp: 17     Temp: 97.8 F (36.6 C)  98 F (36.7 C)   TempSrc: Oral  Oral   SpO2: 98%  100%   Weight:      Height:        Intake/Output Summary (Last 24 hours) at 06/11/2019 1242 Last data filed at 06/11/2019 2563 Gross per 24 hour  Intake 2249.69 ml  Output -  Net 2249.69 ml   Filed Weights   06/07/19 1000  Weight: 44.9 kg    Exam:  General: NAD   Cardiovascular: S1, S2 present  Respiratory:  Diminished breath sounds bilaterally  Abdomen: Soft, LLQ tenderness, nondistended, bowel sounds present  Musculoskeletal: Trace bilateral pedal edema  noted  Skin: Normal  Psychiatry: Normal mood   Data Reviewed: CBC: Recent Labs  Lab 06/08/19 0254 06/09/19 0448 06/10/19 0441 06/11/19 0334 06/11/19 1003  WBC 10.9* 9.2 9.1 9.4  11.0*  NEUTROABS  --  6.4 6.4 6.6 8.4*  HGB 7.9* 7.6* 8.2* 8.2* 9.2*  HCT 24.3* 23.1* 25.3* 24.3* 27.7*  MCV 89.3 89.9 90.7 87.4 87.7  PLT 442* 428* 480* 464* 852*   Basic Metabolic Panel: Recent Labs  Lab 06/06/19 2106 06/08/19 0254 06/09/19 0448 06/10/19 0441 06/11/19 0334  NA 135 131* 136 137 139  K 5.3* 5.6* 4.8 4.6 4.0  CL 114* 114* 116* 118* 117*  CO2 9* 8* 10* 11* 13*  GLUCOSE 106* 122* 93 85 96  BUN 46* 45* 35* 28* 23  CREATININE 2.12* 2.22* 2.10* 1.71* 1.63*  CALCIUM 10.1 8.7* 8.5* 8.6* 8.7*   GFR: Estimated Creatinine Clearance: 21.1 mL/min (A) (by C-G formula based on SCr of 1.63 mg/dL (H)). Liver Function Tests: Recent Labs  Lab 06/06/19 2106  AST 18  ALT 13  ALKPHOS 86  BILITOT 0.5  PROT 7.8  ALBUMIN 3.9   Recent Labs  Lab 06/06/19 2106  LIPASE 32   No results for input(s): AMMONIA in the last 168 hours. Coagulation Profile: No results for input(s): INR, PROTIME in the last 168 hours. Cardiac Enzymes: No results for input(s): CKTOTAL, CKMB, CKMBINDEX, TROPONINI in the last 168 hours. BNP (last 3 results) No results for input(s): PROBNP in the last 8760 hours. HbA1C: No results for input(s): HGBA1C in the last 72 hours. CBG: No results for input(s): GLUCAP in the last 168 hours. Lipid Profile: No results for input(s): CHOL, HDL, LDLCALC, TRIG, CHOLHDL, LDLDIRECT in the last 72 hours. Thyroid Function Tests: No results for input(s): TSH, T4TOTAL, FREET4, T3FREE, THYROIDAB in the last 72 hours. Anemia Panel: No results for input(s): VITAMINB12, FOLATE, FERRITIN, TIBC, IRON, RETICCTPCT in the last 72 hours. Urine analysis:    Component Value Date/Time   COLORURINE YELLOW 06/06/2019 2115   APPEARANCEUR CLEAR 06/06/2019 2115   LABSPEC 1.013 06/06/2019 2115   PHURINE 5.0 06/06/2019 2115   GLUCOSEU NEGATIVE 06/06/2019 2115   HGBUR NEGATIVE 06/06/2019 2115   BILIRUBINUR NEGATIVE 06/06/2019 2115   KETONESUR NEGATIVE 06/06/2019 2115   PROTEINUR  100 (A) 06/06/2019 2115   UROBILINOGEN 0.2 08/24/2015 1737   NITRITE NEGATIVE 06/06/2019 2115   LEUKOCYTESUR NEGATIVE 06/06/2019 2115   Sepsis Labs: @LABRCNTIP (procalcitonin:4,lacticidven:4)  ) Recent Results (from the past 240 hour(s))  SARS CORONAVIRUS 2 Nasal Swab Aptima Multi Swab     Status: None   Collection Time: 06/07/19  5:30 AM   Specimen: Aptima Multi Swab; Nasal Swab  Result Value Ref Range Status   SARS Coronavirus 2 NEGATIVE NEGATIVE Final    Comment: (NOTE) SARS-CoV-2 target nucleic acids are NOT DETECTED. The SARS-CoV-2 RNA is generally detectable in upper and lower respiratory specimens during the acute phase of infection. Negative results do not preclude SARS-CoV-2 infection, do not rule out co-infections with other pathogens, and should not be used as the sole basis for treatment or other patient management decisions. Negative results must be combined with clinical observations, patient history, and epidemiological information. The expected result is Negative. Fact Sheet for Patients: SugarRoll.be Fact Sheet for Healthcare Providers: https://www.woods-mathews.com/ This test is not yet approved or cleared by the [place] FDA and  has been authorized for detection and/or diagnosis of SARS-CoV-2 by FDA under an Emergency Use Authorization (EUA). This EUA will remain  in  effect (meaning this test can be used) for the duration of the COVID-19 declaration under Section 56 4(b)(1) of the Act, 21 U.S.C. section 360bbb-3(b)(1), unless the authorization is terminated or revoked sooner. Performed at [facility], [place] [address] [place], [place] [address]   Culture, blood (routine x 2)     Status: None (Preliminary result)   Collection Time: 06/08/19 11:19 AM   Specimen: BLOOD RIGHT HAND  Result Value Ref Range Status   Specimen Description BLOOD RIGHT HAND  Final   Special Requests   Final    BOTTLES DRAWN  AEROBIC ONLY Blood Culture adequate volume   Culture   Final    NO GROWTH 3 DAYS Performed at [facility], [place] [address], [place], [place] [address]    Report Status PENDING  Incomplete  Culture, blood (routine x 2)     Status: None (Preliminary result)   Collection Time: 06/08/19 11:24 AM   Specimen: BLOOD  Result Value Ref Range Status   Specimen Description BLOOD LEFT ANTECUBITAL  Final   Special Requests   Final    BOTTLES DRAWN AEROBIC ONLY Blood Culture adequate volume   Culture   Final    NO GROWTH 3 DAYS Performed at [facility], [place] [address] [place], [place] [address]    Report Status PENDING  Incomplete      Studies: No results found.  Scheduled Meds: . citalopram  20 mg Oral Daily  . clonazePAM  0.5 mg Oral QHS  . donepezil  5 mg Oral QHS  . feeding supplement (ENSURE ENLIVE)  237 mL Oral BID BM  . ferrous sulfate  325 mg Oral Q breakfast  . gabapentin  100 mg Oral TID  . isosorbide-hydrALAZINE  2 tablet Oral TID  . metoprolol tartrate  12.5 mg Oral BID  . multivitamin with minerals  1 tablet Oral Daily  . pantoprazole  40 mg Oral Daily  . rosuvastatin  40 mg Oral QHS  . sodium bicarbonate  1,300 mg Oral BID  . valproic acid  250 mg Oral BID    Continuous Infusions: . piperacillin-tazobactam (ZOSYN)  IV 3.375 g (06/11/19 0949)     LOS: 3 days     Alma Friendly, MD [facility]  If 7PM-7AM, please contact night-coverage [email or web address] 06/11/2019, 12:42 PM

## 2019-06-11 NOTE — Progress Notes (Signed)
Contacted Dr. Horris Latino per text page to see if patient still needed to be on precautions. Awaiting response

## 2019-06-12 ENCOUNTER — Encounter (HOSPITAL_COMMUNITY): Payer: Self-pay | Admitting: *Deleted

## 2019-06-12 ENCOUNTER — Encounter (HOSPITAL_COMMUNITY)
Admission: EM | Disposition: A | Discharge status: Home / Self Care | Payer: Self-pay | Source: Home / Self Care | Attending: Internal Medicine

## 2019-06-12 DIAGNOSIS — R197 Diarrhea, unspecified: Secondary | ICD-10-CM

## 2019-06-12 DIAGNOSIS — R933 Abnormal findings on diagnostic imaging of other parts of digestive tract: Secondary | ICD-10-CM

## 2019-06-12 HISTORY — PX: FLEXIBLE SIGMOIDOSCOPY: SHX5431

## 2019-06-12 LAB — BASIC METABOLIC PANEL
Anion gap: 10 (ref 5–15)
BUN: 22 mg/dL (ref 8–23)
CO2: 15 mmol/L — ABNORMAL LOW (ref 22–32)
Calcium: 8.8 mg/dL — ABNORMAL LOW (ref 8.9–10.3)
Chloride: 115 mmol/L — ABNORMAL HIGH (ref 98–111)
Creatinine, Ser: 1.72 mg/dL — ABNORMAL HIGH (ref 0.44–1.00)
GFR calc Af Amer: 33 mL/min — ABNORMAL LOW (ref >60)
GFR calc non Af Amer: 29 mL/min — ABNORMAL LOW (ref >60)
Glucose, Bld: 104 mg/dL — ABNORMAL HIGH (ref 70–99)
Potassium: 3.7 mmol/L (ref 3.5–5.1)
Sodium: 140 mmol/L (ref 135–145)

## 2019-06-12 LAB — CBC WITH DIFFERENTIAL/PLATELET
Abs Immature Granulocytes: 0.05 10*3/uL (ref 0.00–0.07)
Basophils Absolute: 0.1 10*3/uL (ref 0.0–0.1)
Basophils Relative: 1 %
Eosinophils Absolute: 0.4 10*3/uL (ref 0.0–0.5)
Eosinophils Relative: 5 %
HCT: 22.8 % — ABNORMAL LOW (ref 36.0–46.0)
Hemoglobin: 7.6 g/dL — ABNORMAL LOW (ref 12.0–15.0)
Immature Granulocytes: 1 %
Lymphocytes Relative: 22 %
Lymphs Abs: 1.5 10*3/uL (ref 0.7–4.0)
MCH: 29.2 pg (ref 26.0–34.0)
MCHC: 33.3 g/dL (ref 30.0–36.0)
MCV: 87.7 fL (ref 80.0–100.0)
Monocytes Absolute: 0.7 10*3/uL (ref 0.1–1.0)
Monocytes Relative: 9 %
Neutro Abs: 4.5 10*3/uL (ref 1.7–7.7)
Neutrophils Relative %: 62 %
Platelets: 470 10*3/uL — ABNORMAL HIGH (ref 150–400)
RBC: 2.6 MIL/uL — ABNORMAL LOW (ref 3.87–5.11)
RDW: 14.3 % (ref 11.5–15.5)
WBC: 7.1 10*3/uL (ref 4.0–10.5)
nRBC: 0 % (ref 0.0–0.2)

## 2019-06-12 SURGERY — SIGMOIDOSCOPY, FLEXIBLE

## 2019-06-12 MED ORDER — FLEET ENEMA 7-19 GM/118ML RE ENEM
1.0000 | ENEMA | Freq: Once | RECTAL | Status: AC
  Administered 2019-06-12: 1 via RECTAL
  Filled 2019-06-12: qty 1

## 2019-06-12 MED ORDER — LABETALOL HCL 5 MG/ML IV SOLN
10.0000 mg | Freq: Once | INTRAVENOUS | Status: AC
  Administered 2019-06-12: 10 mg via INTRAVENOUS
  Filled 2019-06-12: qty 4

## 2019-06-12 MED ORDER — FENTANYL CITRATE (PF) 100 MCG/2ML IJ SOLN
INTRAMUSCULAR | Status: AC
  Filled 2019-06-12: qty 2

## 2019-06-12 MED ORDER — DIPHENHYDRAMINE HCL 50 MG/ML IJ SOLN
INTRAMUSCULAR | Status: AC
  Filled 2019-06-12: qty 1

## 2019-06-12 MED ORDER — METOPROLOL TARTRATE 25 MG PO TABS
25.0000 mg | ORAL_TABLET | Freq: Two times a day (BID) | ORAL | Status: DC
  Administered 2019-06-12 – 2019-06-14 (×5): 25 mg via ORAL
  Filled 2019-06-12 (×5): qty 1

## 2019-06-12 MED ORDER — MIDAZOLAM HCL (PF) 5 MG/ML IJ SOLN
INTRAMUSCULAR | Status: AC
  Filled 2019-06-12: qty 1

## 2019-06-12 NOTE — Progress Notes (Signed)
NT notified RN that pt BP was 194/112 MAP 137. RN retook BP and it was 202/119 MAP 141. PRN hydralazine given. MD notified awaiting for call back. Pt alert and awake watching TV

## 2019-06-12 NOTE — Plan of Care (Signed)
  Problem: Clinical Measurements: Goal: Ability to maintain clinical measurements within normal limits will improve Outcome: Progressing Goal: Will remain free from infection Outcome: Progressing   Problem: Nutrition: Goal: Adequate nutrition will be maintained Outcome: Progressing   Problem: Coping: Goal: Level of anxiety will decrease Outcome: Progressing   Problem: Pain Managment: Goal: General experience of comfort will improve Outcome: Progressing   Problem: Safety: Goal: Ability to remain free from injury will improve Outcome: Progressing

## 2019-06-12 NOTE — Op Note (Signed)
[facility] [name] Procedure Date : 06/12/2019 MRN: [id number] Attending MD: Ladene Artist , MD Date of Birth: 07/26/1941 CSN: [id number] Age: 78 Admit Type: Inpatient Procedure:                Flexible Sigmoidoscopy Indications:              Clinically significant diarrhea of unexplained                            origin, Abnormal CT of the GI tract (left colon) Providers:                Pricilla Riffle. Fuller Plan, MD, Baird Cancer, RN, Ashley Jacobs, RN, Lina Sar, Technician, Elspeth Cho Tech., Technician Referring MD:             [facility] Medicines:                None Complications:            No immediate complications. Estimated Blood Loss:     Estimated blood loss: none. Procedure:                Pre-Anesthesia Assessment:                           - Prior to the procedure, a History and Physical                            was performed, and patient medications and                            allergies were reviewed. The patient's tolerance of                            previous anesthesia was also reviewed. The risks                            and benefits of the procedure and the sedation                            options and risks were discussed with the patient.                            All questions were answered, and informed consent                            was obtained. Prior Anticoagulants: The patient has                            taken Plavix (clopidogrel), last dose was 5 days  prior to procedure. ASA Grade Assessment: III - A                            patient with severe systemic disease. After                            reviewing the risks and benefits, the patient was                            deemed in satisfactory condition to undergo the                            procedure.                           After obtaining  informed consent, the scope was                            passed under direct vision. The [id number]-[id number]                            ([id number]) Olympus pediatric colonoscope was                            introduced through the anus and advanced to the the                            sigmoid colon. The flexible sigmoidoscopy was                            technically difficult and complex due to multiple                            diverticula in the colon and a tortuous colon which                            limited the exam. The patient tolerated the                            procedure fairly well. Scope In: Scope Out: Findings:      The perianal and digital rectal examinations were normal.      Multiple medium-mouthed diverticula were found in the sigmoid colon.       There was narrowing of the colon in association with the diverticular       opening. There was evidence of severe diverticular spasm. There was       evidence of an impacted diverticulum. There was no evidence of       diverticular bleeding. No evidence of colitis. Unable to advance beyond       30 cm.      The rectum appeared normal. Impression:               - Severe diverticulosis in the sigmoid colon. There  was narrowing of the colon in association with the                            diverticular opening. There was evidence of severe                            diverticular spasm. There was evidence of an                            impacted diverticulum. There was no evidence of                            diverticular bleeding.                           - The rectum is normal.                           - No specimens collected. Recommendation:           - Return patient to hospital ward for ongoing care.                           - Continue IV antibiotics for suspected                            diverticulitis.                           - Check C diff. Procedure Code(s):        --- Professional  ---                           [phone], Sigmoidoscopy, flexible; diagnostic,                            including collection of specimen(s) by brushing or                            washing, when performed (separate procedure) Diagnosis Code(s):        --- Professional ---                           R19.7, Diarrhea, unspecified                           K57.30, Diverticulosis of large intestine without                            perforation or abscess without bleeding                           R93.3, Abnormal findings on diagnostic imaging of                            other parts of digestive tract CPT copyright 2019 [employer]. All rights reserved. The  codes documented in this report are preliminary and upon coder review may  be revised to meet current compliance requirements. Ladene Artist, MD 06/12/2019 2:53:54 PM This report has been signed electronically. Number of Addenda: 0

## 2019-06-12 NOTE — Progress Notes (Signed)
PROGRESS NOTE  Alyssa [name] BTD:[id number] DOB: [birth year] DOA: 06/06/2019 PCP: Everardo Beals, NP  HPI/Recap of past 14 hours: 78 year old female with medical history significant for PVD, hypertension, hyperlipidemia, CKD, presents to the ED complaining of left lower quadrant pain.  Patient denies any nausea/vomiting, fever.  CT abdomen showed diverticulitis with no complication.  Due to persistent abdominal pain patient admitted for further management.   Today, pt still with significant diarrhea, LLQ abdominal pain. Noted to also have uncontrolled BP. Denies any chest pain, SOB, N/V   Assessment/Plan: Principal Problem:   Diverticulitis Active Problems:   Essential hypertension   Hyperlipidemia   CKD (chronic kidney disease) stage 4, GFR 15-29 ml/min (HCC)   Chronic pain syndrome   Depression with anxiety   Abnormal CT scan, colon   Diarrhea  Left sided colitis/diverticulitis Currently afebrile with no leukocytosis, diarrhea BC x2 NGTD GI panel negative C.diff pending CT abdomen showed distal colonic wall thickening consistent with mild colitis.  No perforation or abscess identified DC IV fluids due to persistent uncontrolled BP Advance to full liquid diet Continue IV Zosyn (may consider stopping if no other source is found) GI on board- Flex sig on 8/25 showed severe diverticulosis with spasm. No colitis noted. Rec zosyn and test for c.diff Monitor closely  ??Acute blood loss/anemia of CKD Reported dark stools, although on iron supplementation Baseline hemoglobin between 8-9 FOBT positive Last EGD in 2019 showed Barrett's esophagus, colonoscopy in 2019 showed moderate amount of diverticulosis in sigmoid colon GI consulted, flex sig as above Continue Protonix Type and screen done, transfuse if hemoglobin less than 7 Daily CBC  ??AKI on CKD stage IV, with metabolic acidosis Improving Baseline creatinine between 1.7- 2, although fluctuates ??Chronically  acidotic Status post IV fluids Continue bicarb tabs Daily BMP  Hyperkalemia Resolved S/P 1 dose of [place] Daily BMP  Hypertension BP elevated Continue Lopressor, bidil  Hyperlipidemia Continue Crestor  PVD Continue to hold aspirin and Plavix due to possible bleed/procedure  Chronic pain Continue home regimen  Depression/anxiety/?Dementia Continue home regimen Continue Aricept         Malnutrition Type:  Nutrition Problem: Inadequate oral intake Etiology: altered GI function, poor appetite(diverticultis)   Malnutrition Characteristics:  Signs/Symptoms: meal completion < 50%   Nutrition Interventions:  Interventions: Ensure Enlive (each supplement provides 350kcal and 20 grams of protein), Magic cup, MVI    Estimated body mass index is 18.11 kg/m as calculated from the following:   Height as of this encounter: 5\' 2"  (1.575 m).   Weight as of this encounter: 44.9 kg.     Code Status: DNR  Family Communication: None at bedside  Disposition Plan: Home once stable  Consultants:  GI  Procedures:  Flex sig on 06/12/19  Antimicrobials:  Zosyn  DVT prophylaxis: SCDs   Objective: Vitals:   06/12/19 1549 06/12/19 1631 06/12/19 1733 06/12/19 2005  BP: (!) 200/100 (!) 177/96 (!) 162/95 (!) 176/105  Pulse:   77 81  Resp:   14 14  Temp:   (!) 97.4 F (36.3 C) 97.9 F (36.6 C)  TempSrc:   Oral Oral  SpO2:   100% 100%  Weight:      Height:        Intake/Output Summary (Last 24 hours) at 06/12/2019 2110 Last data filed at 06/12/2019 1700 Gross per 24 hour  Intake 360 ml  Output -  Net 360 ml   Filed Weights   06/07/19 1000 06/12/19 1251  Weight: 44.9 kg 44.9 kg  Exam:  General: NAD   Cardiovascular: S1, S2 present  Respiratory: CTAB  Abdomen: Soft, LLQ tenderness, nondistended, bowel sounds present  Musculoskeletal: Trace bilateral pedal edema noted  Skin: Normal  Psychiatry: Normal mood   Data Reviewed: CBC:  Recent Labs  Lab 06/09/19 0448 06/10/19 0441 06/11/19 0334 06/11/19 1003 06/12/19 0158  WBC 9.2 9.1 9.4 11.0* 7.1  NEUTROABS 6.4 6.4 6.6 8.4* 4.5  HGB 7.6* 8.2* 8.2* 9.2* 7.6*  HCT 23.1* 25.3* 24.3* 27.7* 22.8*  MCV 89.9 90.7 87.4 87.7 87.7  PLT 428* 480* 464* 501* 741*   Basic Metabolic Panel: Recent Labs  Lab 06/08/19 0254 06/09/19 0448 06/10/19 0441 06/11/19 0334 06/12/19 0158  NA 131* 136 137 139 140  K 5.6* 4.8 4.6 4.0 3.7  CL 114* 116* 118* 117* 115*  CO2 8* 10* 11* 13* 15*  GLUCOSE 122* 93 85 96 104*  BUN 45* 35* 28* 23 22  CREATININE 2.22* 2.10* 1.71* 1.63* 1.72*  CALCIUM 8.7* 8.5* 8.6* 8.7* 8.8*   GFR: Estimated Creatinine Clearance: 20 mL/min (A) (by C-G formula based on SCr of 1.72 mg/dL (H)). Liver Function Tests: Recent Labs  Lab 06/06/19 2106  AST 18  ALT 13  ALKPHOS 86  BILITOT 0.5  PROT 7.8  ALBUMIN 3.9   Recent Labs  Lab 06/06/19 2106  LIPASE 32   No results for input(s): AMMONIA in the last 168 hours. Coagulation Profile: No results for input(s): INR, PROTIME in the last 168 hours. Cardiac Enzymes: No results for input(s): CKTOTAL, CKMB, CKMBINDEX, TROPONINI in the last 168 hours. BNP (last 3 results) No results for input(s): PROBNP in the last 8760 hours. HbA1C: No results for input(s): HGBA1C in the last 72 hours. CBG: No results for input(s): GLUCAP in the last 168 hours. Lipid Profile: No results for input(s): CHOL, HDL, LDLCALC, TRIG, CHOLHDL, LDLDIRECT in the last 72 hours. Thyroid Function Tests: No results for input(s): TSH, T4TOTAL, FREET4, T3FREE, THYROIDAB in the last 72 hours. Anemia Panel: No results for input(s): VITAMINB12, FOLATE, FERRITIN, TIBC, IRON, RETICCTPCT in the last 72 hours. Urine analysis:    Component Value Date/Time   COLORURINE YELLOW 06/06/2019 2115   APPEARANCEUR CLEAR 06/06/2019 2115   LABSPEC 1.013 06/06/2019 2115   PHURINE 5.0 06/06/2019 2115   GLUCOSEU NEGATIVE 06/06/2019 2115   HGBUR  NEGATIVE 06/06/2019 2115   BILIRUBINUR NEGATIVE 06/06/2019 2115   KETONESUR NEGATIVE 06/06/2019 2115   PROTEINUR 100 (A) 06/06/2019 2115   UROBILINOGEN 0.2 08/24/2015 1737   NITRITE NEGATIVE 06/06/2019 2115   LEUKOCYTESUR NEGATIVE 06/06/2019 2115   Sepsis Labs: @LABRCNTIP (procalcitonin:4,lacticidven:4)  ) Recent Results (from the past 240 hour(s))  SARS CORONAVIRUS 2 Nasal Swab Aptima Multi Swab     Status: None   Collection Time: 06/07/19  5:30 AM   Specimen: Aptima Multi Swab; Nasal Swab  Result Value Ref Range Status   SARS Coronavirus 2 NEGATIVE NEGATIVE Final    Comment: (NOTE) SARS-CoV-2 target nucleic acids are NOT DETECTED. The SARS-CoV-2 RNA is generally detectable in upper and lower respiratory specimens during the acute phase of infection. Negative results do not preclude SARS-CoV-2 infection, do not rule out co-infections with other pathogens, and should not be used as the sole basis for treatment or other patient management decisions. Negative results must be combined with clinical observations, patient history, and epidemiological information. The expected result is Negative. Fact Sheet for Patients: SugarRoll.be Fact Sheet for Healthcare Providers: https://www.woods-mathews.com/ This test is not yet approved or cleared by the [place]  FDA and  has been authorized for detection and/or diagnosis of SARS-CoV-2 by FDA under an Emergency Use Authorization (EUA). This EUA will remain  in effect (meaning this test can be used) for the duration of the COVID-19 declaration under Section 56 4(b)(1) of the Act, 21 U.S.C. section 360bbb-3(b)(1), unless the authorization is terminated or revoked sooner. Performed at [facility], [place] [address], [place], [place] [address]   Culture, blood (routine x 2)     Status: None (Preliminary result)   Collection Time: 06/08/19 11:19 AM   Specimen: BLOOD RIGHT HAND  Result Value  Ref Range Status   Specimen Description BLOOD RIGHT HAND  Final   Special Requests   Final    BOTTLES DRAWN AEROBIC ONLY Blood Culture adequate volume   Culture   Final    NO GROWTH 4 DAYS Performed at [facility], [place] [address] [place], [place] [address]    Report Status PENDING  Incomplete  Culture, blood (routine x 2)     Status: None (Preliminary result)   Collection Time: 06/08/19 11:24 AM   Specimen: BLOOD  Result Value Ref Range Status   Specimen Description BLOOD LEFT ANTECUBITAL  Final   Special Requests   Final    BOTTLES DRAWN AEROBIC ONLY Blood Culture adequate volume   Culture   Final    NO GROWTH 4 DAYS Performed at [facility], [place] [address], [place], [place] [address]    Report Status PENDING  Incomplete  Gastrointestinal Panel by PCR , Stool     Status: None   Collection Time: 06/10/19  4:12 PM   Specimen: Stool  Result Value Ref Range Status   Campylobacter species NOT DETECTED NOT DETECTED Final   Plesimonas shigelloides NOT DETECTED NOT DETECTED Final   Salmonella species NOT DETECTED NOT DETECTED Final   Yersinia enterocolitica NOT DETECTED NOT DETECTED Final   Vibrio species NOT DETECTED NOT DETECTED Final   Vibrio cholerae NOT DETECTED NOT DETECTED Final   Enteroaggregative E coli (EAEC) NOT DETECTED NOT DETECTED Final   Enteropathogenic E coli (EPEC) NOT DETECTED NOT DETECTED Final   Enterotoxigenic E coli (ETEC) NOT DETECTED NOT DETECTED Final   Shiga like toxin producing E coli (STEC) NOT DETECTED NOT DETECTED Final   Shigella/Enteroinvasive E coli (EIEC) NOT DETECTED NOT DETECTED Final   Cryptosporidium NOT DETECTED NOT DETECTED Final   Cyclospora cayetanensis NOT DETECTED NOT DETECTED Final   Entamoeba histolytica NOT DETECTED NOT DETECTED Final   Giardia lamblia NOT DETECTED NOT DETECTED Final   Adenovirus F40/41 NOT DETECTED NOT DETECTED Final   Astrovirus NOT DETECTED NOT DETECTED Final   Norovirus GI/GII NOT DETECTED NOT  DETECTED Final   Rotavirus A NOT DETECTED NOT DETECTED Final   Sapovirus (I, II, IV, and V) NOT DETECTED NOT DETECTED Final    Comment: Performed at [facility], [address] [place], [place] [address]      Studies: No results found.  Scheduled Meds: . citalopram  20 mg Oral Daily  . clonazePAM  0.5 mg Oral QHS  . donepezil  5 mg Oral QHS  . feeding supplement (ENSURE ENLIVE)  237 mL Oral BID BM  . ferrous sulfate  325 mg Oral Q breakfast  . gabapentin  100 mg Oral TID  . isosorbide-hydrALAZINE  2 tablet Oral TID  . metoprolol tartrate  25 mg Oral BID  . multivitamin with minerals  1 tablet Oral Daily  . pantoprazole  40  mg Oral Daily  . rosuvastatin  40 mg Oral QHS  . sodium bicarbonate  1,300 mg Oral BID  . valproic acid  250 mg Oral BID    Continuous Infusions: . piperacillin-tazobactam (ZOSYN)  IV 3.375 g (06/12/19 2036)     LOS: 4 days     Alma Friendly, MD [facility]  If 7PM-7AM, please contact night-coverage [email or web address] 06/12/2019, 9:10 PM

## 2019-06-12 NOTE — Interval H&P Note (Signed)
History and Physical Interval Note:  06/12/2019 1:51 PM  Alyssa [name]  has presented today for surgery, with the diagnosis of Diarrhea.  Colitis.  The various methods of treatment have been discussed with the patient and family. After consideration of risks, benefits and other options for treatment, the patient has consented to  Procedure(s) with comments: FLEXIBLE SIGMOIDOSCOPY (N/A) - Patient had little bit to eat this morning so this will be unsedated. as a surgical intervention.  The patient's history has been reviewed, patient examined, no change in status, stable for surgery.  I have reviewed the patient's chart and labs.  Questions were answered to the patient's satisfaction.     Pricilla Riffle. Fuller Plan

## 2019-06-12 NOTE — Progress Notes (Addendum)
Daily Rounding Note  06/12/2019, 9:59 AM  LOS: 4 days   SUBJECTIVE:   Chief complaint: Left-sided colitis vs diverticulitis. Several stools yesterday, 3 overnight, 2 or 3 this morning.  They are dark in color but no obvious blood.  Patient continues to have pain in her left abdomen.  Tolerating full liquids though not taking a whole lot of p.o. intake.  Denies nausea   OBJECTIVE:         Vital signs in last 24 hours:    Temp:  [97.7 F (36.5 C)-99 F (37.2 C)] 99 F (37.2 C) (08/25 0859) Pulse Rate:  [85-107] 107 (08/25 0859) Resp:  [14-16] 14 (08/25 0859) BP: (159-188)/(93-113) 177/113 (08/25 0859) SpO2:  [98 %-99 %] 98 % (08/25 0859) Last BM Date: 06/12/19 Filed Weights   06/07/19 1000  Weight: 44.9 kg   General: Looks frail,  Comfortable. Heart: RRR. Chest: Clear bilaterally.  No labored breathing or cough. Abdomen: Soft.  Mild to moderate left-sided tenderness.  No guarding or rebound.  Active bowel sounds. Extremities: No CCE. Neuro/Psych: Appropriate.  No tremors.  Not confused.  Intake/Output from previous day: 08/24 0701 - 08/25 0700 In: 1064.8 [P.O.:960; IV Piggyback:104.8] Out: -   Intake/Output this shift: No intake/output data recorded.  Lab Results: Recent Labs    06/11/19 0334 06/11/19 1003 06/12/19 0158  WBC 9.4 11.0* 7.1  HGB 8.2* 9.2* 7.6*  HCT 24.3* 27.7* 22.8*  PLT 464* 501* 470*   BMET Recent Labs    06/10/19 0441 06/11/19 0334 06/12/19 0158  NA 137 139 140  K 4.6 4.0 3.7  CL 118* 117* 115*  CO2 11* 13* 15*  GLUCOSE 85 96 104*  BUN 28* 23 22  CREATININE 1.71* 1.63* 1.72*  CALCIUM 8.6* 8.7* 8.8*    ASSESMENT:   *    Distal colitis (ischemic vs infectious?) versus diverticulitis.  Favor colitis as dx given the diarrhea.  FOBT positive Day 5 Zosyn.  WBCs 12.8 >> 7.1.  No fevers.  CTAP without contrast showed distal colonic wall thickening consistent with mild colitis.  Stool pathogen panel negative.  This PCR panel does not include C. difficile Pandiverticulosis with luminal narrowing, spasm in the sigmoid, transverse colon polyp (sected, not retrieved) on colonoscopy 02/2018   *    Hiatal hernia with distal, likely reflux related, esophagitis per CT. 02/2018 EGD with grade C esophagitis, Barrett's mucosa. No PPI or H2 blocker on home med list.  Currently on IV Protonix.  *     Normocytic anemia.  On iron at home.  Hgb 9s >>  11 >> 7.1. No transfusions this admission.   Transfuse PRBCs 11/2018, 03/2019.  *    Chronic kidney disease.  *    Hypertension, not controlled.  *    Plavix, on hold.    PLAN   *     Flexi-Seal placement was ordered but not yet placed.  Hold on this until after flexible sigmoidoscopy.  *      Flexible sigmoidoscopy today.  Prep with a couple of fleets enemas. She ate a bit of her full liquid breakfast so procedure will be unsedated.  *     For now hold off on checking C. difficile but this may be indicated depending on flexible sigmoidoscopy findings.   [name]  06/12/2019, 9:59 AM Phone [phone]    Attending Physician Note   I have taken an interval history, reviewed the chart  and examined the patient. I agree with the Advanced Practitioner's note, impression and recommendations.   Left sided colon wall thickening on CT AP concerning for colitis. Diarrhea persists and GI pathogen panel was negative.  Check C diff  Plavix on hold Flex sigmoidoscopy today to further evaluate   Lucio Edward, MD [facility]

## 2019-06-12 NOTE — H&P (View-Only) (Signed)
Daily Rounding Note  06/12/2019, 9:59 AM  LOS: 4 days   SUBJECTIVE:   Chief complaint: Left-sided colitis vs diverticulitis. Several stools yesterday, 3 overnight, 2 or 3 this morning.  They are dark in color but no obvious blood.  Patient continues to have pain in her left abdomen.  Tolerating full liquids though not taking a whole lot of p.o. intake.  Denies nausea   OBJECTIVE:         Vital signs in last 24 hours:    Temp:  [97.7 F (36.5 C)-99 F (37.2 C)] 99 F (37.2 C) (08/25 0859) Pulse Rate:  [85-107] 107 (08/25 0859) Resp:  [14-16] 14 (08/25 0859) BP: (159-188)/(93-113) 177/113 (08/25 0859) SpO2:  [98 %-99 %] 98 % (08/25 0859) Last BM Date: 06/12/19 Filed Weights   06/07/19 1000  Weight: 44.9 kg   General: Looks frail,  Comfortable. Heart: RRR. Chest: Clear bilaterally.  No labored breathing or cough. Abdomen: Soft.  Mild to moderate left-sided tenderness.  No guarding or rebound.  Active bowel sounds. Extremities: No CCE. Neuro/Psych: Appropriate.  No tremors.  Not confused.  Intake/Output from previous day: 08/24 0701 - 08/25 0700 In: 1064.8 [P.O.:960; IV Piggyback:104.8] Out: -   Intake/Output this shift: No intake/output data recorded.  Lab Results: Recent Labs    06/11/19 0334 06/11/19 1003 06/12/19 0158  WBC 9.4 11.0* 7.1  HGB 8.2* 9.2* 7.6*  HCT 24.3* 27.7* 22.8*  PLT 464* 501* 470*   BMET Recent Labs    06/10/19 0441 06/11/19 0334 06/12/19 0158  NA 137 139 140  K 4.6 4.0 3.7  CL 118* 117* 115*  CO2 11* 13* 15*  GLUCOSE 85 96 104*  BUN 28* 23 22  CREATININE 1.71* 1.63* 1.72*  CALCIUM 8.6* 8.7* 8.8*    ASSESMENT:   *    Distal colitis (ischemic vs infectious?) versus diverticulitis.  Favor colitis as dx given the diarrhea.  FOBT positive Day 5 Zosyn.  WBCs 12.8 >> 7.1.  No fevers.  CTAP without contrast showed distal colonic wall thickening consistent with mild colitis.  Stool pathogen panel negative.  This PCR panel does not include C. difficile Pandiverticulosis with luminal narrowing, spasm in the sigmoid, transverse colon polyp (sected, not retrieved) on colonoscopy 02/2018   *    Hiatal hernia with distal, likely reflux related, esophagitis per CT. 02/2018 EGD with grade C esophagitis, Barrett's mucosa. No PPI or H2 blocker on home med list.  Currently on IV Protonix.  *     Normocytic anemia.  On iron at home.  Hgb 9s >>  11 >> 7.1. No transfusions this admission.   Transfuse PRBCs 11/2018, 03/2019.  *    Chronic kidney disease.  *    Hypertension, not controlled.  *    Plavix, on hold.    PLAN   *     Flexi-Seal placement was ordered but not yet placed.  Hold on this until after flexible sigmoidoscopy.  *      Flexible sigmoidoscopy today.  Prep with a couple of fleets enemas. She ate a bit of her full liquid breakfast so procedure will be unsedated.  *     For now hold off on checking C. difficile but this may be indicated depending on flexible sigmoidoscopy findings.   [name]  06/12/2019, 9:59 AM Phone [phone]    Attending Physician Note   I have taken an interval history, reviewed the chart  and examined the patient. I agree with the Advanced Practitioner's note, impression and recommendations.   Left sided colon wall thickening on CT AP concerning for colitis. Diarrhea persists and GI pathogen panel was negative.  Check C diff  Plavix on hold Flex sigmoidoscopy today to further evaluate   Lucio Edward, MD [facility]

## 2019-06-12 NOTE — Progress Notes (Signed)
Physical Therapy Treatment Patient Details Name: Alyssa [name] MRN: [id number] DOB: 1941-05-18 Today's Date: 06/12/2019    History of Present Illness Patient is a 78 y/o female who presents with LLQ pain and left hip pain. Found to have diverticulitis. PMH includes CKD, HTN, PVD, HLD.    PT Comments    Patient progressing with ambulation stability and endurance despite remaining on liquid diet.  Still with abdominal pain and likely work up to continue.  PT to follow acutely and likely to progress to not need follow up PT but will reassess as needed.   Follow Up Recommendations  No PT follow up;Supervision - Intermittent     Equipment Recommendations  None recommended by PT    Recommendations for Other Services       Precautions / Restrictions Precautions Precautions: Fall Restrictions Weight Bearing Restrictions: No    Mobility  Bed Mobility Overal bed mobility: Modified Independent                Transfers Overall transfer level: Needs assistance Equipment used: Rolling walker (2 wheeled) Transfers: Sit to/from [facility] Sit to Stand: Min guard Stand pivot transfers: Min guard       General transfer comment: assist to [facility], close S to minguard for safety  Ambulation/Gait Ambulation/Gait assistance: Min guard;Supervision Gait Distance (Feet): 320 Feet Assistive device: Rolling walker (2 wheeled) Gait Pattern/deviations: Step-to pattern;Decreased stride length     General Gait Details: walks on toes on R due to LLD   Stairs             Wheelchair Mobility    Modified Rankin (Stroke Patients Only)       Balance Overall balance assessment: Needs assistance   Sitting balance-Leahy Scale: Good       Standing balance-Leahy Scale: Fair                              Cognition Arousal/Alertness: Awake/alert Behavior During Therapy: WFL for tasks assessed/performed Overall Cognitive Status: Within  Functional Limits for tasks assessed                                        Exercises      General Comments General comments (skin integrity, edema, etc.): soiled in bed so assisted to [facility] and assisted with hygiene in standing, adv. practitioner in to assess pt during session      Pertinent Vitals/Pain Pain Score: 8  Pain Location: abdomen Pain Descriptors / Indicators: Sharp Pain Intervention(s): Monitored during session;Repositioned    Home Living                      Prior Function            PT Goals (current goals can now be found in the care plan section) Progress towards PT goals: Progressing toward goals    Frequency    Min 3X/week      PT Plan Current plan remains appropriate    Co-evaluation              AM-PAC PT "6 Clicks" Mobility   Outcome Measure  Help needed turning from your back to your side while in a flat bed without using bedrails?: None Help needed moving from lying on your back to sitting on the side of a flat bed without using  bedrails?: None Help needed moving to and from a bed to a chair (including a wheelchair)?: A Little Help needed standing up from a chair using your arms (e.g., wheelchair or bedside chair)?: A Little Help needed to walk in hospital room?: A Little Help needed climbing 3-5 steps with a railing? : A Little 6 Click Score: 20    End of Session   Activity Tolerance: Patient tolerated treatment well Patient left: in bed;with call bell/phone within reach   PT Visit Diagnosis: Other abnormalities of gait and mobility (R26.89)     Time: 7034-0352 PT Time Calculation (min) (ACUTE ONLY): 27 min  Charges:  $Gait Training: 8-22 mins $Therapeutic Activity: 8-22 mins                     Magda Kiel, [place] [phone] 06/12/2019    Reginia Naas 06/12/2019, 12:47 PM

## 2019-06-13 ENCOUNTER — Encounter (HOSPITAL_COMMUNITY): Payer: Self-pay | Admitting: Gastroenterology

## 2019-06-13 DIAGNOSIS — K5792 Diverticulitis of intestine, part unspecified, without perforation or abscess without bleeding: Secondary | ICD-10-CM

## 2019-06-13 LAB — BASIC METABOLIC PANEL
Anion gap: 11 (ref 5–15)
BUN: 19 mg/dL (ref 8–23)
CO2: 15 mmol/L — ABNORMAL LOW (ref 22–32)
Calcium: 8.8 mg/dL — ABNORMAL LOW (ref 8.9–10.3)
Chloride: 113 mmol/L — ABNORMAL HIGH (ref 98–111)
Creatinine, Ser: 1.79 mg/dL — ABNORMAL HIGH (ref 0.44–1.00)
GFR calc Af Amer: 32 mL/min — ABNORMAL LOW (ref >60)
GFR calc non Af Amer: 27 mL/min — ABNORMAL LOW (ref >60)
Glucose, Bld: 95 mg/dL (ref 70–99)
Potassium: 3.8 mmol/L (ref 3.5–5.1)
Sodium: 139 mmol/L (ref 135–145)

## 2019-06-13 LAB — CULTURE, BLOOD (ROUTINE X 2)
Culture: NO GROWTH
Culture: NO GROWTH
Special Requests: ADEQUATE
Special Requests: ADEQUATE

## 2019-06-13 LAB — CBC WITH DIFFERENTIAL/PLATELET
Abs Immature Granulocytes: 0.05 10*3/uL (ref 0.00–0.07)
Basophils Absolute: 0.1 10*3/uL (ref 0.0–0.1)
Basophils Relative: 1 %
Eosinophils Absolute: 0.5 10*3/uL (ref 0.0–0.5)
Eosinophils Relative: 6 %
HCT: 24.2 % — ABNORMAL LOW (ref 36.0–46.0)
Hemoglobin: 8.1 g/dL — ABNORMAL LOW (ref 12.0–15.0)
Immature Granulocytes: 1 %
Lymphocytes Relative: 19 %
Lymphs Abs: 1.4 10*3/uL (ref 0.7–4.0)
MCH: 29.6 pg (ref 26.0–34.0)
MCHC: 33.5 g/dL (ref 30.0–36.0)
MCV: 88.3 fL (ref 80.0–100.0)
Monocytes Absolute: 0.8 10*3/uL (ref 0.1–1.0)
Monocytes Relative: 10 %
Neutro Abs: 4.7 10*3/uL (ref 1.7–7.7)
Neutrophils Relative %: 63 %
Platelets: 476 10*3/uL — ABNORMAL HIGH (ref 150–400)
RBC: 2.74 MIL/uL — ABNORMAL LOW (ref 3.87–5.11)
RDW: 14.2 % (ref 11.5–15.5)
WBC: 7.5 10*3/uL (ref 4.0–10.5)
nRBC: 0 % (ref 0.0–0.2)

## 2019-06-13 MED ORDER — PANTOPRAZOLE SODIUM 40 MG PO TBEC
40.0000 mg | DELAYED_RELEASE_TABLET | Freq: Two times a day (BID) | ORAL | Status: DC
  Administered 2019-06-13 – 2019-06-16 (×6): 40 mg via ORAL
  Filled 2019-06-13 (×6): qty 1

## 2019-06-13 MED ORDER — DICYCLOMINE HCL 10 MG PO CAPS
10.0000 mg | ORAL_CAPSULE | Freq: Three times a day (TID) | ORAL | Status: DC
  Administered 2019-06-13 – 2019-06-16 (×10): 10 mg via ORAL
  Filled 2019-06-13 (×13): qty 1

## 2019-06-13 MED ORDER — SODIUM CHLORIDE 0.9 % IV SOLN
510.0000 mg | INTRAVENOUS | Status: DC
  Administered 2019-06-13: 22:00:00 510 mg via INTRAVENOUS
  Filled 2019-06-13 (×2): qty 17

## 2019-06-13 NOTE — Plan of Care (Signed)

## 2019-06-13 NOTE — Progress Notes (Addendum)
    Progress Note   Subjective  LLQ pain persists. Having small formed stools today.    Objective  Vital signs in last 24 hours: Temp:  [97.4 F (36.3 C)-98.5 F (36.9 C)] 98 F (36.7 C) (08/26 0821) Pulse Rate:  [77-107] 107 (08/26 0821) Resp:  [14-18] 18 (08/26 0821) BP: (150-225)/(92-124) 190/110 (08/26 1020) SpO2:  [96 %-100 %] 99 % (08/26 0821) Weight:  [44.9 kg] 44.9 kg (08/25 1251) Last BM Date: 06/12/19  General: Alert, well-developed, in NAD Heart:  Regular rate and rhythm; no murmurs Chest: Clear to ascultation bilaterally Abdomen:  Soft, LLQ tenderness and nondistended. Normal bowel sounds, without guarding, and without rebound.   Extremities:  Without edema. Neurologic:  Alert and  oriented x4; grossly normal neurologically. Psych:  Alert and cooperative. Normal mood and affect.  Intake/Output from previous day: 08/25 0701 - 08/26 0700 In: 650.2 [P.O.:360; IV Piggyback:290.2] Out: -  Intake/Output this shift: No intake/output data recorded.  Lab Results: Recent Labs    06/11/19 1003 06/12/19 0158 06/13/19 0716  WBC 11.0* 7.1 7.5  HGB 9.2* 7.6* 8.1*  HCT 27.7* 22.8* 24.2*  PLT 501* 470* 476*   BMET Recent Labs    06/11/19 0334 06/12/19 0158 06/13/19 0403  NA 139 140 139  K 4.0 3.7 3.8  CL 117* 115* 113*  CO2 13* 15* 15*  GLUCOSE 96 104* 95  BUN 23 22 19   CREATININE 1.63* 1.72* 1.79*  CALCIUM 8.7* 8.8* 8.8*      Assessment & Recommendations   1. Diverticulitis with sigmoid colon significant narrowing and spasm noted on flex sig. No evidence of colitis on flex sig and solid stool today so cancelling C diff. Mgmt discussed with Dr. Algis Liming. Adding dicyclomine 10 mg tid for acute symptom mgmt. Continue IV Zosyn and full liquid diet until pain significantly improved then change to PO antibiotics and advance diet.   2. Anemia with history of iron deficiency anemia. Colonoscopy and EGD in 01/2018 with erosive esophagitis and CKD felt to be cause  of anemia. Hold oral iron until diverticulitis has resolved. Recommend IV Feraheme in hospital.  3. Barrett's esophagus and LA Class C esophagitis. Increase Protonix to 40 mg po bid in hospital then resume Protonix 40 mg po qd as outpatient.  GI signing off, available if needed.     LOS: 5 days   Shayra Anton T. Fuller Plan MD  06/13/2019, 10:24 AM

## 2019-06-13 NOTE — Progress Notes (Signed)
PROGRESS NOTE   Alyssa [name]  BJS:[id number]    DOB: 06-Nov-1940    DOA: 06/06/2019  PCP: Everardo Beals, NP   I have briefly reviewed patients previous medical records in [facility].  Chief Complaint  Patient presents with  . Abdominal Pain    Brief Narrative:  78 year old female with PMH of PAD, HTN, HLD, CKD presented due to left lower quadrant abdominal pain, nausea, vomiting, fever and diarrhea.  CT abdomen showed diverticulitis without complication.  Colonoscopy showed severe sigmoid diverticulitis.  Improving.   Assessment & Plan:   Principal Problem:   Diverticulitis Active Problems:   Essential hypertension   Hyperlipidemia   CKD (chronic kidney disease) stage 4, GFR 15-29 ml/min (HCC)   Chronic pain syndrome   Depression with anxiety   Abnormal CT scan, colon   Diarrhea   Acute sigmoid diverticulitis [facility] follow-up appreciated.  Discussed with Dr. Fuller Plan. Flexible sigmoidoscopy 8/25 showed sigmoid diverticulitis with significant narrowing and spasm but no evidence of colitis. GI panel negative.  Blood cultures x2: Negative to date.  Stools or clinical picture not consistent with C. difficile, order canceled. Continue full liquid diet.  Advance diet as symptoms improve. Continue IV Zosyn.  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 Hold oral iron until acute diverticulitis resolved. IV Feraheme. Follow CBC and transfuse if hemoglobin 7 g or less.  Barrett's esophagus with esophagitis Increase Protonix to 40 mg twice daily while hospitalized and then change to once daily at discharge.  Stage IV chronic kidney disease Baseline creatinine fluctuates and not clear but may be in the one-point 7-2 range. Currently at baseline. Follow BMP.  Reviewed with patient to see if she follows with outpatient nephrology.  NAG  metabolic acidosis Continue bicarbonate supplements.  Due to CKD.  Hyperkalemia Resolved after a dose of Veltassa.  Follow BMP.  Essential hypertension Uncontrolled.  Continue metoprolol and BiDil and PRN IV hydralazine. Pain control. Adjust medications as needed.  Hyperlipidemia Continue Crestor.  PAD Holding aspirin and Plavix, consider resuming soon.  Chronic pain Continue Home regimen.  Depression/anxiety/?  Dementia Continue Aricept and home regimen.  Estimated body mass index is 18.11 kg/m as calculated from the following:   Height as of this encounter: 5\' 2"  (1.575 m).   Weight as of this encounter: 44.9 kg.    Nutritional Status Nutrition Problem: Inadequate oral intake Etiology: altered GI function, poor appetite(diverticultis) Signs/Symptoms: meal completion < 50% Interventions: Ensure Enlive (each supplement provides 350kcal and 20 grams of protein), Magic cup, MVI  DVT prophylaxis: SCDs Code Status: DNR Family Communication: None at bedside Disposition: DC home pending clinical improvement   Consultants:  [facility]  Procedures:  Flexible sigmoidoscopy  Antimicrobials:  None   Subjective: Overall feels better.  Still has some left lower quadrant abdominal pain but improved compared to yesterday.  No nausea or vomiting.  Objective:  Vitals:   06/13/19 0943 06/13/19 1020 06/13/19 1205 06/13/19 1729  BP: (!) 180/108 (!) 190/110 (!) 167/100 (!) 153/99  Pulse:   85 92  Resp:   15   Temp:   98.3 F (36.8 C)   TempSrc:   Oral   SpO2:   99%   Weight:      Height:        Examination:  General exam: Pleasant elderly female, moderately built and nourished lying comfortably  propped up in bed without distress. Respiratory system: Clear to auscultation. Respiratory effort normal. Cardiovascular system: S1 & S2 heard, RRR. No JVD, murmurs, rubs, gallops or clicks. No pedal edema. Gastrointestinal system: Abdomen is nondistended, soft.  Mild LLQ  tenderness without peritoneal signs. No organomegaly or masses felt. Normal bowel sounds heard. Central nervous system: Alert and oriented. No focal neurological deficits. Extremities: Symmetric 5 x 5 power. Skin: No rashes, lesions or ulcers Psychiatry: Judgement and insight appear normal. Mood & affect appropriate.     Data Reviewed: I have personally reviewed following labs and imaging studies  CBC: Recent Labs  Lab 06/10/19 0441 06/11/19 0334 06/11/19 1003 06/12/19 0158 06/13/19 0716  WBC 9.1 9.4 11.0* 7.1 7.5  NEUTROABS 6.4 6.6 8.4* 4.5 4.7  HGB 8.2* 8.2* 9.2* 7.6* 8.1*  HCT 25.3* 24.3* 27.7* 22.8* 24.2*  MCV 90.7 87.4 87.7 87.7 88.3  PLT 480* 464* 501* 470* 768*   Basic Metabolic Panel: Recent Labs  Lab 06/09/19 0448 06/10/19 0441 06/11/19 0334 06/12/19 0158 06/13/19 0403  NA 136 137 139 140 139  K 4.8 4.6 4.0 3.7 3.8  CL 116* 118* 117* 115* 113*  CO2 10* 11* 13* 15* 15*  GLUCOSE 93 85 96 104* 95  BUN 35* 28* 23 22 19   CREATININE 2.10* 1.71* 1.63* 1.72* 1.79*  CALCIUM 8.5* 8.6* 8.7* 8.8* 8.8*   Liver Function Tests: Recent Labs  Lab 06/06/19 2106  AST 18  ALT 13  ALKPHOS 86  BILITOT 0.5  PROT 7.8  ALBUMIN 3.9    Cardiac Enzymes: No results for input(s): CKTOTAL, CKMB, CKMBINDEX, TROPONINI in the last 168 hours.  CBG: No results for input(s): GLUCAP in the last 168 hours.  Recent Results (from the past 240 hour(s))  SARS CORONAVIRUS 2 Nasal Swab Aptima Multi Swab     Status: None   Collection Time: 06/07/19  5:30 AM   Specimen: Aptima Multi Swab; Nasal Swab  Result Value Ref Range Status   SARS Coronavirus 2 NEGATIVE NEGATIVE Final    Comment: (NOTE) SARS-CoV-2 target nucleic acids are NOT DETECTED. The SARS-CoV-2 RNA is generally detectable in upper and lower respiratory specimens during the acute phase of infection. Negative results do not preclude SARS-CoV-2 infection, do not rule out co-infections with other pathogens, and should not be  used as the sole basis for treatment or other patient management decisions. Negative results must be combined with clinical observations, patient history, and epidemiological information. The expected result is Negative. Fact Sheet for Patients: SugarRoll.be Fact Sheet for Healthcare Providers: https://www.woods-mathews.com/ This test is not yet approved or cleared by the [place] FDA and  has been authorized for detection and/or diagnosis of SARS-CoV-2 by FDA under an Emergency Use Authorization (EUA). This EUA will remain  in effect (meaning this test can be used) for the duration of the COVID-19 declaration under Section 56 4(b)(1) of the Act, 21 U.S.C. section 360bbb-3(b)(1), unless the authorization is terminated or revoked sooner. Performed at [facility], [place] [address] [place], [place] [address]   Culture, blood (routine x 2)     Status: None   Collection Time: 06/08/19 11:19 AM   Specimen: BLOOD RIGHT HAND  Result Value Ref Range Status   Specimen Description BLOOD RIGHT HAND  Final   Special Requests   Final    BOTTLES DRAWN AEROBIC ONLY Blood Culture adequate volume   Culture   Final    NO GROWTH 5 DAYS Performed at [facility], [address] [place], [place] [address]    Report Status 06/13/2019 FINAL  Final  Culture, blood (routine x 2)     Status: None   Collection Time: 06/08/19 11:24 AM   Specimen: BLOOD  Result Value Ref Range Status   Specimen Description BLOOD LEFT ANTECUBITAL  Final   Special Requests   Final    BOTTLES DRAWN AEROBIC ONLY Blood Culture adequate volume   Culture   Final    NO GROWTH 5 DAYS Performed at [facility], [place] [address], [place], [place] [address]    Report Status 06/13/2019 FINAL  Final  Gastrointestinal Panel by PCR , Stool     Status: None   Collection Time: 06/10/19  4:12 PM   Specimen: Stool  Result Value Ref Range Status   Campylobacter species NOT  DETECTED NOT DETECTED Final   Plesimonas shigelloides NOT DETECTED NOT DETECTED Final   Salmonella species NOT DETECTED NOT DETECTED Final   Yersinia enterocolitica NOT DETECTED NOT DETECTED Final   Vibrio species NOT DETECTED NOT DETECTED Final   Vibrio cholerae NOT DETECTED NOT DETECTED Final   Enteroaggregative E coli (EAEC) NOT DETECTED NOT DETECTED Final   Enteropathogenic E coli (EPEC) NOT DETECTED NOT DETECTED Final   Enterotoxigenic E coli (ETEC) NOT DETECTED NOT DETECTED Final   Shiga like toxin producing E coli (STEC) NOT DETECTED NOT DETECTED Final   Shigella/Enteroinvasive E coli (EIEC) NOT DETECTED NOT DETECTED Final   Cryptosporidium NOT DETECTED NOT DETECTED Final   Cyclospora cayetanensis NOT DETECTED NOT DETECTED Final   Entamoeba histolytica NOT DETECTED NOT DETECTED Final   Giardia lamblia NOT DETECTED NOT DETECTED Final   Adenovirus F40/41 NOT DETECTED NOT DETECTED Final   Astrovirus NOT DETECTED NOT DETECTED Final   Norovirus GI/GII NOT DETECTED NOT DETECTED Final   Rotavirus A NOT DETECTED NOT DETECTED Final   Sapovirus (I, II, IV, and V) NOT DETECTED NOT DETECTED Final    Comment: Performed at [facility], [address] [place],  [address]         Radiology Studies: No results found.      Scheduled Meds: . citalopram  20 mg Oral Daily  . clonazePAM  0.5 mg Oral QHS  . dicyclomine  10 mg Oral TID AC  . donepezil  5 mg Oral QHS  . feeding supplement (ENSURE ENLIVE)  237 mL Oral BID BM  . gabapentin  100 mg Oral TID  . isosorbide-hydrALAZINE  2 tablet Oral TID  . metoprolol tartrate  25 mg Oral BID  . multivitamin with minerals  1 tablet Oral Daily  . pantoprazole  40 mg Oral BID  . rosuvastatin  40 mg Oral QHS  . sodium bicarbonate  1,300 mg Oral BID  . valproic acid  250 mg Oral BID   Continuous Infusions: . piperacillin-tazobactam (ZOSYN)  IV 3.375 g (06/13/19 1351)     LOS: 5 days     Vernell Leep, MD, FACP, [facility].  [facility]  To contact the attending provider between 7A-7P or the covering provider during after hours 7P-7A, please log into the web site [email or web address] and access using universal [place] password for that web site. If you do not have the password, please call the hospital operator.  06/13/2019, 6:28 PM

## 2019-06-13 NOTE — Progress Notes (Signed)
C-diff precautions ordered by MD.  Patient is currently not having any diarrhea.  Patient and staff is aware to obtain specimen when available.

## 2019-06-14 DIAGNOSIS — N179 Acute kidney failure, unspecified: Secondary | ICD-10-CM

## 2019-06-14 DIAGNOSIS — N189 Chronic kidney disease, unspecified: Secondary | ICD-10-CM

## 2019-06-14 DIAGNOSIS — D509 Iron deficiency anemia, unspecified: Secondary | ICD-10-CM

## 2019-06-14 LAB — CBC WITH DIFFERENTIAL/PLATELET
Abs Immature Granulocytes: 0.08 10*3/uL — ABNORMAL HIGH (ref 0.00–0.07)
Basophils Absolute: 0.1 10*3/uL (ref 0.0–0.1)
Basophils Relative: 1 %
Eosinophils Absolute: 0.4 10*3/uL (ref 0.0–0.5)
Eosinophils Relative: 5 %
HCT: 23.6 % — ABNORMAL LOW (ref 36.0–46.0)
Hemoglobin: 7.7 g/dL — ABNORMAL LOW (ref 12.0–15.0)
Immature Granulocytes: 1 %
Lymphocytes Relative: 23 %
Lymphs Abs: 1.9 10*3/uL (ref 0.7–4.0)
MCH: 28.7 pg (ref 26.0–34.0)
MCHC: 32.6 g/dL (ref 30.0–36.0)
MCV: 88.1 fL (ref 80.0–100.0)
Monocytes Absolute: 0.9 10*3/uL (ref 0.1–1.0)
Monocytes Relative: 11 %
Neutro Abs: 4.7 10*3/uL (ref 1.7–7.7)
Neutrophils Relative %: 59 %
Platelets: 471 10*3/uL — ABNORMAL HIGH (ref 150–400)
RBC: 2.68 MIL/uL — ABNORMAL LOW (ref 3.87–5.11)
RDW: 13.9 % (ref 11.5–15.5)
WBC: 8 10*3/uL (ref 4.0–10.5)
nRBC: 0.3 % — ABNORMAL HIGH (ref 0.0–0.2)

## 2019-06-14 LAB — BASIC METABOLIC PANEL
Anion gap: 10 (ref 5–15)
BUN: 16 mg/dL (ref 8–23)
CO2: 19 mmol/L — ABNORMAL LOW (ref 22–32)
Calcium: 9.1 mg/dL (ref 8.9–10.3)
Chloride: 110 mmol/L (ref 98–111)
Creatinine, Ser: 1.57 mg/dL — ABNORMAL HIGH (ref 0.44–1.00)
GFR calc Af Amer: 37 mL/min — ABNORMAL LOW (ref >60)
GFR calc non Af Amer: 32 mL/min — ABNORMAL LOW (ref >60)
Glucose, Bld: 101 mg/dL — ABNORMAL HIGH (ref 70–99)
Potassium: 3.9 mmol/L (ref 3.5–5.1)
Sodium: 139 mmol/L (ref 135–145)

## 2019-06-14 MED ORDER — METOPROLOL TARTRATE 50 MG PO TABS
50.0000 mg | ORAL_TABLET | Freq: Two times a day (BID) | ORAL | Status: DC
  Administered 2019-06-14 – 2019-06-16 (×4): 50 mg via ORAL
  Filled 2019-06-14 (×4): qty 1

## 2019-06-14 NOTE — Progress Notes (Addendum)
PROGRESS NOTE   Alyssa [name]  XKG:[id number]    DOB: September 17, 1941    DOA: 06/06/2019  PCP: Everardo Beals, NP   I have briefly reviewed patients previous medical records in [facility].  Chief Complaint  Patient presents with  . Abdominal Pain    Brief Narrative:  78 year old female with PMH of PAD, HTN, HLD, CKD presented due to left lower quadrant abdominal pain, nausea, vomiting, fever and diarrhea.  CT abdomen showed diverticulitis without complication.  Colonoscopy showed severe sigmoid diverticulitis.  Improving slowly.  Assessment & Plan:   Principal Problem:   Diverticulitis Active Problems:   Essential hypertension   Hyperlipidemia   CKD (chronic kidney disease) stage 4, GFR 15-29 ml/min (HCC)   Chronic pain syndrome   Depression with anxiety   Abnormal CT scan, colon   Diarrhea   Acute sigmoid diverticulitis  [facility] follow-up evaluated and signed off 8/26.  Flexible sigmoidoscopy 8/25 showed sigmoid diverticulitis with significant narrowing and spasm but no evidence of colitis.  GI panel negative.  Blood cultures x2: Negative to date.  Stools or clinical picture not consistent with C. difficile, order canceled.  Continue empirically started IV Zosyn.  Patient reports somewhat more pain today than yesterday.  Thereby continue full liquid diet today and advance when better.  Continue scheduled Bentyl.  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  Hold oral iron until acute diverticulitis resolved.  S/p IV Feraheme x2 doses.  Follow CBC and transfuse if hemoglobin 7 g or less.  Hemoglobin 7.7 today.  Barrett's esophagus with esophagitis  Increase Protonix to 40 mg twice daily while hospitalized and then change to once daily at discharge.  Stage IV chronic kidney disease  Baseline creatinine fluctuates and  not clear but may be in the one-point 7-2 range.  Creatinine has improved to 1.57 today.  Follow BMP.  Review with patient to see if she follows with outpatient nephrology.  NAG metabolic acidosis  Continue bicarbonate supplements.  Due to CKD.  Improved from 15-19.  Hyperkalemia Resolved after a dose of Veltassa.    Essential hypertension Uncontrolled.  Continue metoprolol and BiDil and PRN IV hydralazine. Pain control. Increased metoprolol from 25 to 50 mg twice daily.  Hyperlipidemia Continue Crestor.  PAD Holding aspirin and Plavix, consider resuming soon.  Chronic pain Continue Home regimen.  Depression/anxiety/?  Dementia Continue Aricept and home regimen.  Estimated body mass index is 18.11 kg/m as calculated from the following:   Height as of this encounter: 5\' 2"  (1.575 m).   Weight as of this encounter: 44.9 kg.    Nutritional Status Nutrition Problem: Inadequate oral intake Etiology: altered GI function, poor appetite(diverticultis) Signs/Symptoms: meal completion < 50% Interventions: Ensure Enlive (each supplement provides 350kcal and 20 grams of protein), Magic cup, MVI  DVT prophylaxis: SCDs Code Status: DNR Family Communication: None at bedside.  Called and updated son regarding patient's care via phone on 8/27. Disposition: DC home pending clinical improvement   Consultants:  [facility]  Procedures:  Flexible sigmoidoscopy  Antimicrobials:  None   Subjective: Patient reports that her left lower quadrant abdominal pain is somewhat worse today, rates it at 8/10 compared to 6/10 yesterday.  Aching.  Nonradiating.  Had loose BM today.  Tolerating liquid diet without nausea vomiting.  Objective:  Vitals:   06/13/19 1949 06/14/19 7026 06/14/19 3785  06/14/19 1522  BP: (!) 158/94 (!) 183/97 (!) 157/89 (!) 169/97  Pulse: 95 92 98 89  Resp: 14 14 16 16   Temp: 99.1 F (37.3 C) 99 F (37.2 C) 98.6 F (37 C) 98.3 F (36.8 C)  TempSrc: Oral  Oral Oral Oral  SpO2: 98% 98% 97% 99%  Weight:      Height:        Examination:  General exam: Pleasant elderly female, moderately built and nourished lying comfortably propped up in bed without distress. Respiratory system: Clear to auscultation.  No increased work of breathing. Cardiovascular system: S1 and S2 heard, RRR.  No JVD, murmurs or pedal edema. Gastrointestinal system: Abdomen is nondistended and soft.  Left lower quadrant tenderness on deep palpation without rigidity, guarding or rebound.  No organomegaly or masses appreciated.  Normal bowel sounds. Central nervous system: Alert and oriented. No focal neurological deficits. Extremities: Symmetric 5 x 5 power. Skin: No rashes, lesions or ulcers Psychiatry: Judgement and insight appear normal. Mood & affect appropriate.     Data Reviewed: I have personally reviewed following labs and imaging studies  CBC: Recent Labs  Lab 06/11/19 0334 06/11/19 1003 06/12/19 0158 06/13/19 0716 06/14/19 0449  WBC 9.4 11.0* 7.1 7.5 8.0  NEUTROABS 6.6 8.4* 4.5 4.7 4.7  HGB 8.2* 9.2* 7.6* 8.1* 7.7*  HCT 24.3* 27.7* 22.8* 24.2* 23.6*  MCV 87.4 87.7 87.7 88.3 88.1  PLT 464* 501* 470* 476* 809*   Basic Metabolic Panel: Recent Labs  Lab 06/10/19 0441 06/11/19 0334 06/12/19 0158 06/13/19 0403 06/14/19 0449  NA 137 139 140 139 139  K 4.6 4.0 3.7 3.8 3.9  CL 118* 117* 115* 113* 110  CO2 11* 13* 15* 15* 19*  GLUCOSE 85 96 104* 95 101*  BUN 28* 23 22 19 16   CREATININE 1.71* 1.63* 1.72* 1.79* 1.57*  CALCIUM 8.6* 8.7* 8.8* 8.8* 9.1   Liver Function Tests: No results for input(s): AST, ALT, ALKPHOS, BILITOT, PROT, ALBUMIN in the last 168 hours.  Cardiac Enzymes: No results for input(s): CKTOTAL, CKMB, CKMBINDEX, TROPONINI in the last 168 hours.  CBG: No results for input(s): GLUCAP in the last 168 hours.  Recent Results (from the past 240 hour(s))  SARS CORONAVIRUS 2 Nasal Swab Aptima Multi Swab     Status: None   Collection  Time: 06/07/19  5:30 AM   Specimen: Aptima Multi Swab; Nasal Swab  Result Value Ref Range Status   SARS Coronavirus 2 NEGATIVE NEGATIVE Final    Comment: (NOTE) SARS-CoV-2 target nucleic acids are NOT DETECTED. The SARS-CoV-2 RNA is generally detectable in upper and lower respiratory specimens during the acute phase of infection. Negative results do not preclude SARS-CoV-2 infection, do not rule out co-infections with other pathogens, and should not be used as the sole basis for treatment or other patient management decisions. Negative results must be combined with clinical observations, patient history, and epidemiological information. The expected result is Negative. Fact Sheet for Patients: SugarRoll.be Fact Sheet for Healthcare Providers: https://www.woods-mathews.com/ This test is not yet approved or cleared by the [place] FDA and  has been authorized for detection and/or diagnosis of SARS-CoV-2 by FDA under an Emergency Use Authorization (EUA). This EUA will remain  in effect (meaning this test can be used) for the duration of the COVID-19 declaration under Section 56 4(b)(1) of the Act, 21 U.S.C. section 360bbb-3(b)(1), unless the authorization is terminated or revoked sooner. Performed at [facility], [place] [address] [place], [place] [address]  Culture, blood (routine x 2)     Status: None   Collection Time: 06/08/19 11:19 AM   Specimen: BLOOD RIGHT HAND  Result Value Ref Range Status   Specimen Description BLOOD RIGHT HAND  Final   Special Requests   Final    BOTTLES DRAWN AEROBIC ONLY Blood Culture adequate volume   Culture   Final    NO GROWTH 5 DAYS Performed at [facility], [address], [place], [place] [address]    Report Status 06/13/2019 FINAL  Final  Culture, blood (routine x 2)     Status: None   Collection Time: 06/08/19 11:24 AM   Specimen: BLOOD  Result Value Ref Range Status   Specimen  Description BLOOD LEFT ANTECUBITAL  Final   Special Requests   Final    BOTTLES DRAWN AEROBIC ONLY Blood Culture adequate volume   Culture   Final    NO GROWTH 5 DAYS Performed at [facility], [place] [address] [place], [place] [address]    Report Status 06/13/2019 FINAL  Final  Gastrointestinal Panel by PCR , Stool     Status: None   Collection Time: 06/10/19  4:12 PM   Specimen: Stool  Result Value Ref Range Status   Campylobacter species NOT DETECTED NOT DETECTED Final   Plesimonas shigelloides NOT DETECTED NOT DETECTED Final   Salmonella species NOT DETECTED NOT DETECTED Final   Yersinia enterocolitica NOT DETECTED NOT DETECTED Final   Vibrio species NOT DETECTED NOT DETECTED Final   Vibrio cholerae NOT DETECTED NOT DETECTED Final   Enteroaggregative E coli (EAEC) NOT DETECTED NOT DETECTED Final   Enteropathogenic E coli (EPEC) NOT DETECTED NOT DETECTED Final   Enterotoxigenic E coli (ETEC) NOT DETECTED NOT DETECTED Final   Shiga like toxin producing E coli (STEC) NOT DETECTED NOT DETECTED Final   Shigella/Enteroinvasive E coli (EIEC) NOT DETECTED NOT DETECTED Final   Cryptosporidium NOT DETECTED NOT DETECTED Final   Cyclospora cayetanensis NOT DETECTED NOT DETECTED Final   Entamoeba histolytica NOT DETECTED NOT DETECTED Final   Giardia lamblia NOT DETECTED NOT DETECTED Final   Adenovirus F40/41 NOT DETECTED NOT DETECTED Final   Astrovirus NOT DETECTED NOT DETECTED Final   Norovirus GI/GII NOT DETECTED NOT DETECTED Final   Rotavirus A NOT DETECTED NOT DETECTED Final   Sapovirus (I, II, IV, and V) NOT DETECTED NOT DETECTED Final    Comment: Performed at [facility], [address] [place], [place] [address]         Radiology Studies: No results found.      Scheduled Meds: . citalopram  20 mg Oral Daily  . clonazePAM  0.5 mg Oral QHS  . dicyclomine  10 mg Oral TID AC  . donepezil  5 mg Oral QHS  . feeding supplement (ENSURE ENLIVE)  237 mL Oral  BID BM  . gabapentin  100 mg Oral TID  . isosorbide-hydrALAZINE  2 tablet Oral TID  . metoprolol tartrate  25 mg Oral BID  . multivitamin with minerals  1 tablet Oral Daily  . pantoprazole  40 mg Oral BID  . rosuvastatin  40 mg Oral QHS  . sodium bicarbonate  1,300 mg Oral BID  . valproic acid  250 mg Oral BID   Continuous Infusions: . ferumoxytol 510 mg (06/13/19 2221)  . piperacillin-tazobactam (ZOSYN)  IV 3.375 g (06/14/19 1316)     LOS: 6 days     Vernell Leep, MD, FACP, [facility]. [facility]  To  contact the attending provider between 7A-7P or the covering provider during after hours 7P-7A, please log into the web site [email or web address] and access using universal [place] password for that web site. If you do not have the password, please call the hospital operator.  06/14/2019, 6:05 PM

## 2019-06-14 NOTE — Progress Notes (Signed)
Physical Therapy Treatment Patient Details Name: Alyssa [name] MRN: [id number] DOB: Jul 24, 1941 Today's Date: 06/14/2019    History of Present Illness Pt is a 78 y.o. female admitted 06/05/19 with LLQ pain and L hip pain. Flexible sigmoidoscopy 8/25 showed sigmoid diverticulitis. PMH includes CKD, HTN, PVD, HLD.   PT Comments    Pt progressing with mobility. Able to initiate stair training with rail support at [place]. Son present during session for education. Encouraged increased hallway ambulation with assist from family and/or staff to manage lines/IV pole. Will continue to follow acutely.    Follow Up Recommendations  No PT follow up;Supervision - Intermittent     Equipment Recommendations  None recommended by PT    Recommendations for Other Services       Precautions / Restrictions Precautions Precautions: Fall Restrictions Weight Bearing Restrictions: No    Mobility  Bed Mobility Overal bed mobility: Independent                Transfers Overall transfer level: Needs assistance Equipment used: Rolling walker (2 wheeled) Transfers: Sit to/from Stand Sit to Stand: Supervision         General transfer comment: Cues to maintain proximity to RW when going to sit  Ambulation/Gait Ambulation/Gait assistance: Supervision Gait Distance (Feet): 350 Feet Assistive device: Rolling walker (2 wheeled) Gait Pattern/deviations: Step-through pattern;Decreased stride length Gait velocity: Decreased   General Gait Details: Slow, steady gait with RW; walks on R toes due to baseline leg length discrepancy. Intermittent cues to maintain proximity to RW especially with turns. Supervision for safety   Stairs Stairs: Yes Stairs assistance: Supervision Stair Management: Two rails;Step to pattern;Alternating pattern;Forwards Number of Stairs: 5 General stair comments: Ascend/descended 5 steps with bilateral rail support. Trialled step-to and alternating  pattern; son present for education   Wheelchair Mobility    Modified Rankin (Stroke Patients Only)       Balance Overall balance assessment: Needs assistance Sitting-balance support: Feet supported;No upper extremity supported Sitting balance-Leahy Scale: Good     Standing balance support: During functional activity Standing balance-Leahy Scale: Fair                              Cognition Arousal/Alertness: Awake/alert Behavior During Therapy: Flat affect Overall Cognitive Status: Within Functional Limits for tasks assessed                                 General Comments: WFL for simple tasks; not formally tested      Exercises      General Comments        Pertinent Vitals/Pain Pain Assessment: No/denies pain    Home Living                      Prior Function            PT Goals (current goals can now be found in the care plan section) Acute Rehab PT Goals Patient Stated Goal: to get better and go home PT Goal Formulation: With patient Time For Goal Achievement: 06/24/19 Potential to Achieve Goals: Good Progress towards PT goals: Progressing toward goals    Frequency    Min 3X/week      PT Plan Current plan remains appropriate    Co-evaluation              AM-PAC PT "6 Clicks" Mobility  Outcome Measure  Help needed turning from your back to your side while in a flat bed without using bedrails?: None Help needed moving from lying on your back to sitting on the side of a flat bed without using bedrails?: None Help needed moving to and from a bed to a chair (including a wheelchair)?: None Help needed standing up from a chair using your arms (e.g., wheelchair or bedside chair)?: A Little Help needed to walk in hospital room?: A Little Help needed climbing 3-5 steps with a railing? : A Little 6 Click Score: 21    End of Session Equipment Utilized During Treatment: Gait belt Activity Tolerance: Patient  tolerated treatment well Patient left: in bed;with call bell/phone within reach;with family/visitor present Nurse Communication: Mobility status PT Visit Diagnosis: Other abnormalities of gait and mobility (R26.89)     Time: 6578-4696 PT Time Calculation (min) (ACUTE ONLY): 15 min  Charges:  $Gait Training: 8-22 mins                    Mabeline Caras, PT, DPT Acute Rehabilitation Services  Pager [phone] Office [place] 06/14/2019, 5:21 PM

## 2019-06-14 NOTE — Plan of Care (Signed)
  Problem: Activity: Goal: Risk for activity intolerance will decrease Outcome: Progressing   Problem: Safety: Goal: Ability to remain free from injury will improve Outcome: Progressing   Problem: Skin Integrity: Goal: Risk for impaired skin integrity will decrease Outcome: Progressing   

## 2019-06-15 LAB — CBC WITH DIFFERENTIAL/PLATELET
Abs Immature Granulocytes: 0.11 10*3/uL — ABNORMAL HIGH (ref 0.00–0.07)
Basophils Absolute: 0.1 10*3/uL (ref 0.0–0.1)
Basophils Relative: 1 %
Eosinophils Absolute: 0.4 10*3/uL (ref 0.0–0.5)
Eosinophils Relative: 6 %
HCT: 23.3 % — ABNORMAL LOW (ref 36.0–46.0)
Hemoglobin: 7.8 g/dL — ABNORMAL LOW (ref 12.0–15.0)
Immature Granulocytes: 1 %
Lymphocytes Relative: 23 %
Lymphs Abs: 1.8 10*3/uL (ref 0.7–4.0)
MCH: 29.3 pg (ref 26.0–34.0)
MCHC: 33.5 g/dL (ref 30.0–36.0)
MCV: 87.6 fL (ref 80.0–100.0)
Monocytes Absolute: 0.8 10*3/uL (ref 0.1–1.0)
Monocytes Relative: 11 %
Neutro Abs: 4.5 10*3/uL (ref 1.7–7.7)
Neutrophils Relative %: 58 %
Platelets: 442 10*3/uL — ABNORMAL HIGH (ref 150–400)
RBC: 2.66 MIL/uL — ABNORMAL LOW (ref 3.87–5.11)
RDW: 13.7 % (ref 11.5–15.5)
WBC: 7.6 10*3/uL (ref 4.0–10.5)
nRBC: 0 % (ref 0.0–0.2)

## 2019-06-15 LAB — BASIC METABOLIC PANEL
Anion gap: 10 (ref 5–15)
BUN: 13 mg/dL (ref 8–23)
CO2: 21 mmol/L — ABNORMAL LOW (ref 22–32)
Calcium: 9 mg/dL (ref 8.9–10.3)
Chloride: 105 mmol/L (ref 98–111)
Creatinine, Ser: 1.59 mg/dL — ABNORMAL HIGH (ref 0.44–1.00)
GFR calc Af Amer: 36 mL/min — ABNORMAL LOW (ref >60)
GFR calc non Af Amer: 31 mL/min — ABNORMAL LOW (ref >60)
Glucose, Bld: 100 mg/dL — ABNORMAL HIGH (ref 70–99)
Potassium: 3.8 mmol/L (ref 3.5–5.1)
Sodium: 136 mmol/L (ref 135–145)

## 2019-06-15 NOTE — Plan of Care (Signed)

## 2019-06-15 NOTE — Progress Notes (Signed)
PROGRESS NOTE   Alyssa [name]  BOF:[id number]    DOB: Mar 10, 1941    DOA: 06/06/2019  PCP: Everardo Beals, NP   I have briefly reviewed patients previous medical [facility] in [facility].  Chief Complaint  Patient presents with  . Abdominal Pain    Brief Narrative:  78 year old female with PMH of PAD, HTN, HLD, CKD presented due to left lower quadrant abdominal pain, nausea, vomiting, fever and diarrhea.  CT abdomen showed diverticulitis without complication.  Colonoscopy showed severe sigmoid diverticulitis.  Improving slowly.  Advanced diet to soft diet today.  Assessment & Plan:   Principal Problem:   Diverticulitis Active Problems:   Essential hypertension   Hyperlipidemia   CKD (chronic kidney disease) stage 4, GFR 15-29 ml/min (HCC)   Chronic pain syndrome   Depression with anxiety   Abnormal CT scan, colon   Diarrhea   Acute sigmoid diverticulitis  [facility] follow-up evaluated and signed off 8/26.  Flexible sigmoidoscopy 8/25 showed sigmoid diverticulitis with significant narrowing and spasm but no evidence of colitis.  GI panel negative.  Blood cultures x2: Negative to date.  Stools or clinical picture not consistent with C. difficile, order canceled.  Continue empirically started IV Zosyn.  Patient reports feeling better today with improved pain.  Advance to soft diet, monitor overnight and consider discharge tomorrow if she continues to improve.  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  Hold oral iron until acute diverticulitis resolved.  S/p IV Feraheme x2 doses.  Follow CBC and transfuse if hemoglobin 7 g or less.  Hemoglobin has been stable in the high 7 range for the last couple days.  Barrett's esophagus with esophagitis  Increase Protonix to 40 mg twice daily while hospitalized and then change to once  daily at discharge.  Stage IV chronic kidney disease  Baseline creatinine fluctuates and not clear but may be in the one-point 7-2 range.  Creatinine has improved to 1.57 and plateaued at 1.59.  Follow BMP.  Review with patient to see if she follows with outpatient nephrology.  NAG metabolic acidosis  Continue bicarbonate supplements.  Due to CKD.  Improved from 15 >19 >21  Hyperkalemia Resolved after a dose of Veltassa.    Essential hypertension Uncontrolled.  Continue metoprolol and BiDil and PRN IV hydralazine. Pain control. Increased metoprolol from 25 to 50 mg twice daily.  Better.  Hyperlipidemia Continue Crestor.  PAD Holding aspirin and Plavix, consider resuming soon.  Chronic pain Continue Home regimen.  Depression/anxiety/?  Dementia Continue Aricept and home regimen.  Estimated body mass index is 18.11 kg/m as calculated from the following:   Height as of this encounter: 5\' 2"  (1.575 m).   Weight as of this encounter: 44.9 kg.    Nutritional Status Nutrition Problem: Inadequate oral intake Etiology: altered GI function, poor appetite(diverticultis) Signs/Symptoms: meal completion < 50% Interventions: Ensure Enlive (each supplement provides 350kcal and 20 grams of protein), Magic cup, MVI  DVT prophylaxis: SCDs Code Status: DNR Family Communication: None at bedside.  Called and updated son regarding patient's care via phone on 8/27. Disposition: DC home pending clinical improvement, hopefully 8/29   Consultants:  [facility]  Procedures:  Flexible sigmoidoscopy  Antimicrobials:  None   Subjective: Abdominal pain feels much better.  Wants to advance diet.  Has not had BM since 8/26.  No other complaints  reported.  Objective:  Vitals:   06/15/19 0746 06/15/19 1035 06/15/19 1322 06/15/19 1541  BP: (!) 168/106 (!) 144/85 (!) 145/90 (!) 175/90  Pulse: 88 96 81 80  Resp: 16 16 16 16   Temp: (!) 100.4 F (38 C) 98.8 F (37.1 C) 99.5 F (37.5  C) 97.9 F (36.6 C)  TempSrc: Oral Oral Oral Oral  SpO2: 100% 97% 97% 100%  Weight:      Height:        Examination:  General exam: Pleasant elderly female, moderately built and nourished lying comfortably propped up in bed without distress. Respiratory system: Clear to auscultation.  No increased work of breathing. Cardiovascular system: S1 and S2 heard, RRR.  No JVD, murmurs or pedal edema. Gastrointestinal system: Abdomen is nondistended, soft and nontender today.  No organomegaly or masses appreciated.  Normal bowel sounds heard. Central nervous system: Alert and oriented.  No focal neurological deficits. Extremities: Symmetric 5 x 5 power. Skin: No rashes, lesions or ulcers Psychiatry: Judgement and insight appear normal. Mood & affect appropriate.     Data Reviewed: I have personally reviewed following labs and imaging studies  CBC: Recent Labs  Lab 06/11/19 1003 06/12/19 0158 06/13/19 0716 06/14/19 0449 06/15/19 0504  WBC 11.0* 7.1 7.5 8.0 7.6  NEUTROABS 8.4* 4.5 4.7 4.7 4.5  HGB 9.2* 7.6* 8.1* 7.7* 7.8*  HCT 27.7* 22.8* 24.2* 23.6* 23.3*  MCV 87.7 87.7 88.3 88.1 87.6  PLT 501* 470* 476* 471* 097*   Basic Metabolic Panel: Recent Labs  Lab 06/11/19 0334 06/12/19 0158 06/13/19 0403 06/14/19 0449 06/15/19 0504  NA 139 140 139 139 136  K 4.0 3.7 3.8 3.9 3.8  CL 117* 115* 113* 110 105  CO2 13* 15* 15* 19* 21*  GLUCOSE 96 104* 95 101* 100*  BUN 23 22 19 16 13   CREATININE 1.63* 1.72* 1.79* 1.57* 1.59*  CALCIUM 8.7* 8.8* 8.8* 9.1 9.0   Liver Function Tests: No results for input(s): AST, ALT, ALKPHOS, BILITOT, PROT, ALBUMIN in the last 168 hours.  Cardiac Enzymes: No results for input(s): CKTOTAL, CKMB, CKMBINDEX, TROPONINI in the last 168 hours.  CBG: No results for input(s): GLUCAP in the last 168 hours.  Recent Results (from the past 240 hour(s))  SARS CORONAVIRUS 2 Nasal Swab Aptima Multi Swab     Status: None   Collection Time: 06/07/19  5:30 AM    Specimen: Aptima Multi Swab; Nasal Swab  Result Value Ref Range Status   SARS Coronavirus 2 NEGATIVE NEGATIVE Final    Comment: (NOTE) SARS-CoV-2 target nucleic acids are NOT DETECTED. The SARS-CoV-2 RNA is generally detectable in upper and lower respiratory specimens during the acute phase of infection. Negative results do not preclude SARS-CoV-2 infection, do not rule out co-infections with other pathogens, and should not be used as the sole basis for treatment or other patient management decisions. Negative results must be combined with clinical observations, patient history, and epidemiological information. The expected result is Negative. Fact Sheet for Patients: SugarRoll.be Fact Sheet for Healthcare Providers: https://www.woods-mathews.com/ This test is not yet approved or cleared by the [place] FDA and  has been authorized for detection and/or diagnosis of SARS-CoV-2 by FDA under an Emergency Use Authorization (EUA). This EUA will remain  in effect (meaning this test can be used) for the duration of the COVID-19 declaration under Section 56 4(b)(1) of the Act, 21 U.S.C. section 360bbb-3(b)(1), unless the authorization is terminated or revoked sooner. Performed at [facility], [place]  [address], [place], [place] [address]   Culture, blood (routine x 2)     Status: None   Collection Time: 06/08/19 11:19 AM   Specimen: BLOOD RIGHT HAND  Result Value Ref Range Status   Specimen Description BLOOD RIGHT HAND  Final   Special Requests   Final    BOTTLES DRAWN AEROBIC ONLY Blood Culture adequate volume   Culture   Final    NO GROWTH 5 DAYS Performed at [facility], [place] [address], [place], [place] [address]    Report Status 06/13/2019 FINAL  Final  Culture, blood (routine x 2)     Status: None   Collection Time: 06/08/19 11:24 AM   Specimen: BLOOD  Result Value Ref Range Status   Specimen Description BLOOD LEFT  ANTECUBITAL  Final   Special Requests   Final    BOTTLES DRAWN AEROBIC ONLY Blood Culture adequate volume   Culture   Final    NO GROWTH 5 DAYS Performed at [facility], [place] [address] [place], [place] [address]    Report Status 06/13/2019 FINAL  Final  Gastrointestinal Panel by PCR , Stool     Status: None   Collection Time: 06/10/19  4:12 PM   Specimen: Stool  Result Value Ref Range Status   Campylobacter species NOT DETECTED NOT DETECTED Final   Plesimonas shigelloides NOT DETECTED NOT DETECTED Final   Salmonella species NOT DETECTED NOT DETECTED Final   Yersinia enterocolitica NOT DETECTED NOT DETECTED Final   Vibrio species NOT DETECTED NOT DETECTED Final   Vibrio cholerae NOT DETECTED NOT DETECTED Final   Enteroaggregative E coli (EAEC) NOT DETECTED NOT DETECTED Final   Enteropathogenic E coli (EPEC) NOT DETECTED NOT DETECTED Final   Enterotoxigenic E coli (ETEC) NOT DETECTED NOT DETECTED Final   Shiga like toxin producing E coli (STEC) NOT DETECTED NOT DETECTED Final   Shigella/Enteroinvasive E coli (EIEC) NOT DETECTED NOT DETECTED Final   Cryptosporidium NOT DETECTED NOT DETECTED Final   Cyclospora cayetanensis NOT DETECTED NOT DETECTED Final   Entamoeba histolytica NOT DETECTED NOT DETECTED Final   Giardia lamblia NOT DETECTED NOT DETECTED Final   Adenovirus F40/41 NOT DETECTED NOT DETECTED Final   Astrovirus NOT DETECTED NOT DETECTED Final   Norovirus GI/GII NOT DETECTED NOT DETECTED Final   Rotavirus A NOT DETECTED NOT DETECTED Final   Sapovirus (I, II, IV, and V) NOT DETECTED NOT DETECTED Final    Comment: Performed at [facility], [address] [place], [place] [address]         Radiology Studies: No results found.      Scheduled Meds: . citalopram  20 mg Oral Daily  . clonazePAM  0.5 mg Oral QHS  . dicyclomine  10 mg Oral TID AC  . donepezil  5 mg Oral QHS  . feeding supplement (ENSURE ENLIVE)  237 mL Oral BID BM  . gabapentin   100 mg Oral TID  . isosorbide-hydrALAZINE  2 tablet Oral TID  . metoprolol tartrate  50 mg Oral BID  . multivitamin with minerals  1 tablet Oral Daily  . pantoprazole  40 mg Oral BID  . rosuvastatin  40 mg Oral QHS  . sodium bicarbonate  1,300 mg Oral BID  . valproic acid  250 mg Oral BID   Continuous Infusions: . ferumoxytol 510 mg (06/13/19 2221)  . piperacillin-tazobactam (ZOSYN)  IV 3.375 g (06/15/19 1340)     LOS: 7 days     [facility],  MD, FACP, FHM. [facility]  To contact the attending provider between 7A-7P or the covering provider during after hours 7P-7A, please log into the web site [email or web address] and access using universal [place] password for that web site. If you do not have the password, please call the hospital operator.  06/15/2019, 6:02 PM

## 2019-06-15 NOTE — Progress Notes (Signed)
Nutrition Follow-up  DOCUMENTATION CODES:   Not applicable  INTERVENTION:  Continue Ensure Enlive po BID, each supplement provides 350 kcal and 20 grams of protein.  Continue Magic cup BID with meals, each supplement provides 290 kcal and 9 grams of protein.  Encourage adequate PO intake.   NUTRITION DIAGNOSIS:   Inadequate oral intake related to altered GI function, poor appetite(diverticultis) as evidenced by meal completion < 50%; ongoing  GOAL:   Patient will meet greater than or equal to 90% of their needs; progressing  MONITOR:   PO intake, Weight trends, Supplement acceptance, Diet advancement, I & O's, Labs  REASON FOR ASSESSMENT:   Consult Assessment of nutrition requirement/status  ASSESSMENT:   78 year old female with medical history significant of PVD, HTN, HLD, CKD who presented to ED with abdominal pain and 1 episode of black runny BM; CT revealed distal colonic thickening consistent; admitted with diverticulitis  Diet has been advanced to a soft diet. Meal completion 75%. Pt has been tolerating diet PO. Pt currently has Ensure ordered and has been consuming them. RD to continue with current orders to aid in caloric and protein needs.    Labs and medications reviewed.   Diet Order:   Diet Order            DIET SOFT Room service appropriate? Yes; Fluid consistency: Thin  Diet effective now              EDUCATION NEEDS:   No education needs have been identified at this time  Skin:  Skin Assessment: Reviewed RN Assessment  Last BM:  8/26  Height:   Ht Readings from Last 1 Encounters:  06/12/19 5\' 2"  (1.575 m)    Weight:   Wt Readings from Last 1 Encounters:  06/12/19 44.9 kg    Ideal Body Weight:  50 kg  BMI:  Body mass index is 18.11 kg/m.  Estimated Nutritional Needs:   Kcal:  1500-1700  Protein:  70-80 grams  Fluid:  >/= 1.5 L/day    Corrin Parker, MS, RD, LDN Pager # [phone] After hours/ weekend pager #  [phone]

## 2019-06-16 LAB — CBC WITH DIFFERENTIAL/PLATELET
Abs Immature Granulocytes: 0.13 10*3/uL — ABNORMAL HIGH (ref 0.00–0.07)
Basophils Absolute: 0.1 10*3/uL (ref 0.0–0.1)
Basophils Relative: 1 %
Eosinophils Absolute: 0.4 10*3/uL (ref 0.0–0.5)
Eosinophils Relative: 5 %
HCT: 25.2 % — ABNORMAL LOW (ref 36.0–46.0)
Hemoglobin: 8.5 g/dL — ABNORMAL LOW (ref 12.0–15.0)
Immature Granulocytes: 2 %
Lymphocytes Relative: 20 %
Lymphs Abs: 1.7 10*3/uL (ref 0.7–4.0)
MCH: 29.3 pg (ref 26.0–34.0)
MCHC: 33.7 g/dL (ref 30.0–36.0)
MCV: 86.9 fL (ref 80.0–100.0)
Monocytes Absolute: 1 10*3/uL (ref 0.1–1.0)
Monocytes Relative: 12 %
Neutro Abs: 5.1 10*3/uL (ref 1.7–7.7)
Neutrophils Relative %: 60 %
Platelets: 440 10*3/uL — ABNORMAL HIGH (ref 150–400)
RBC: 2.9 MIL/uL — ABNORMAL LOW (ref 3.87–5.11)
RDW: 13.8 % (ref 11.5–15.5)
WBC: 8.4 10*3/uL (ref 4.0–10.5)
nRBC: 0.2 % (ref 0.0–0.2)

## 2019-06-16 LAB — BASIC METABOLIC PANEL
Anion gap: 12 (ref 5–15)
BUN: 17 mg/dL (ref 8–23)
CO2: 24 mmol/L (ref 22–32)
Calcium: 9.2 mg/dL (ref 8.9–10.3)
Chloride: 102 mmol/L (ref 98–111)
Creatinine, Ser: 1.7 mg/dL — ABNORMAL HIGH (ref 0.44–1.00)
GFR calc Af Amer: 34 mL/min — ABNORMAL LOW (ref >60)
GFR calc non Af Amer: 29 mL/min — ABNORMAL LOW (ref >60)
Glucose, Bld: 104 mg/dL — ABNORMAL HIGH (ref 70–99)
Potassium: 3.7 mmol/L (ref 3.5–5.1)
Sodium: 138 mmol/L (ref 135–145)

## 2019-06-16 MED ORDER — ACETAMINOPHEN 325 MG PO TABS: 650.0000 mg | ORAL_TABLET | Freq: Four times a day (QID) | ORAL | Status: DC | PRN

## 2019-06-16 MED ORDER — CLONAZEPAM 1 MG PO TABS: 0.5000 mg | ORAL_TABLET | Freq: Every day | ORAL | Status: DC

## 2019-06-16 MED ORDER — METOPROLOL TARTRATE 25 MG PO TABS: 50.0000 mg | ORAL_TABLET | Freq: Two times a day (BID) | ORAL | 0 refills | Status: DC

## 2019-06-16 MED ORDER — IRON HIGH-POTENCY 325 MG PO TABS: 325.0000 mg | ORAL_TABLET | Freq: Every day | ORAL | Status: DC

## 2019-06-16 MED ORDER — AMOXICILLIN-POT CLAVULANATE 500-125 MG PO TABS: 1.0000 | ORAL_TABLET | Freq: Two times a day (BID) | ORAL | 0 refills | Status: AC

## 2019-06-16 MED ORDER — ADULT MULTIVITAMIN W/MINERALS CH: 1.0000 | ORAL_TABLET | Freq: Every day | ORAL | Status: DC

## 2019-06-16 MED ORDER — ENSURE ENLIVE PO LIQD: 237.0000 mL | Freq: Two times a day (BID) | ORAL | 12 refills | Status: DC

## 2019-06-16 MED ORDER — ROSUVASTATIN CALCIUM 40 MG PO TABS: 40.0000 mg | ORAL_TABLET | Freq: Every day | ORAL | Status: DC

## 2019-06-16 MED ORDER — PANTOPRAZOLE SODIUM 40 MG PO TBEC: 40.0000 mg | DELAYED_RELEASE_TABLET | Freq: Every day | ORAL | 0 refills | Status: DC

## 2019-06-16 NOTE — Plan of Care (Signed)
  Problem: Education: Goal: Knowledge of General Education information will improve Description: Including pain rating scale, medication(s)/side effects and non-pharmacologic comfort measures Outcome: Progressing   Problem: Health Behavior/Discharge Planning: Goal: Ability to manage health-related needs will improve Outcome: Progressing   Problem: Clinical Measurements: Goal: Ability to maintain clinical measurements within normal limits will improve Outcome: Progressing   Problem: Activity: Goal: Risk for activity intolerance will decrease Outcome: Progressing   Problem: Coping: Goal: Level of anxiety will decrease Outcome: Progressing   Problem: Pain Managment: Goal: General experience of comfort will improve Outcome: Progressing   Problem: Safety: Goal: Ability to remain free from injury will improve Outcome: Progressing   

## 2019-06-16 NOTE — Discharge Instructions (Signed)
Please get your medications reviewed and adjusted by your Primary MD. ° °Please request your Primary MD to go over all Hospital Tests and Procedure/Radiological results at the follow up, please get all Hospital records sent to your Prim MD by signing hospital release before you go home. ° °If you had Pneumonia of Lung problems at the Hospital: °Please get a 2 view Chest X ray done in 6-8 weeks after hospital discharge or sooner if instructed by your Primary MD. ° °If you have Congestive Heart Failure: °Please call your Cardiologist or Primary MD anytime you have any of the following symptoms:  °1) 3 pound weight gain in 24 hours or 5 pounds in 1 week  °2) shortness of breath, with or without a dry hacking cough  °3) swelling in the hands, feet or stomach  °4) if you have to sleep on extra pillows at night in order to breathe ° °Follow cardiac low salt diet and 1.5 lit/day fluid restriction. ° °If you have diabetes °Accuchecks 4 times/day, Once in AM empty stomach and then before each meal. °Log in all results and show them to your primary doctor at your next visit. °If any glucose reading is under 80 or above 300 call your primary MD immediately. ° °If you have Seizure/Convulsions/Epilepsy: °Please do not drive, operate heavy machinery, participate in activities at heights or participate in high speed sports until you have seen by Primary MD or a Neurologist and advised to do so again. ° °If you had Gastrointestinal Bleeding: °Please ask your Primary MD to check a complete blood count within one week of discharge or at your next visit. Your endoscopic/colonoscopic biopsies that are pending at the time of discharge, will also need to followed by your Primary MD. ° °Get Medicines reviewed and adjusted. °Please take all your medications with you for your next visit with your Primary MD ° °Please request your Primary MD to go over all hospital tests and procedure/radiological results at the follow up, please ask your  Primary MD to get all Hospital records sent to his/her office. ° °If you experience worsening of your admission symptoms, develop shortness of breath, life threatening emergency, suicidal or homicidal thoughts you must seek medical attention immediately by calling 911 or calling your MD immediately  if symptoms less severe. ° °You must read complete instructions/literature along with all the possible adverse reactions/side effects for all the Medicines you take and that have been prescribed to you. Take any new Medicines after you have completely understood and accpet all the possible adverse reactions/side effects.  ° °Do not drive or operate heavy machinery when taking Pain medications.  ° °Do not take more than prescribed Pain, Sleep and Anxiety Medications ° °Special Instructions: If you have smoked or chewed Tobacco  in the last 2 yrs please stop smoking, stop any regular Alcohol  and or any Recreational drug use. ° °Wear Seat belts while driving. ° °Please note °You were cared for by a hospitalist during your hospital stay. If you have any questions about your discharge medications or the care you received while you were in the hospital after you are discharged, you can call the unit and asked to speak with the hospitalist on call if the hospitalist that took care of you is not available. Once you are discharged, your primary care physician will handle any further medical issues. Please note that NO REFILLS for any discharge medications will be authorized once you are discharged, as it is imperative that you   return to your primary care physician (or establish a relationship with a primary care physician if you do not have one) for your aftercare needs so that they can reassess your need for medications and monitor your lab values.  You can reach the hospitalist office at phone [phone] or fax [phone]   If you do not have a primary care physician, you can call [phone] for a physician  referral.   Diverticulitis  Diverticulitis is infection or inflammation of small pouches (diverticula) in the colon that form due to a condition called diverticulosis. Diverticula can trap stool (feces) and bacteria, causing infection and inflammation. Diverticulitis may cause severe stomach pain and diarrhea. It may lead to tissue damage in the colon that causes bleeding. The diverticula may also burst (rupture) and cause infected stool to enter other areas of the abdomen. Complications of diverticulitis can include:  Bleeding.  Severe infection.  Severe pain.  Rupture (perforation) of the colon.  Blockage (obstruction) of the colon. What are the causes? This condition is caused by stool becoming trapped in the diverticula, which allows bacteria to grow in the diverticula. This leads to inflammation and infection. What increases the risk? You are more likely to develop this condition if:  You have diverticulosis. The risk for diverticulosis increases if: ? You are overweight or obese. ? You use tobacco products. ? You do not get enough exercise.  You eat a diet that does not include enough fiber. High-fiber foods include fruits, vegetables, beans, nuts, and whole grains. What are the signs or symptoms? Symptoms of this condition may include:  Pain and tenderness in the abdomen. The pain is normally located on the left side of the abdomen, but it may occur in other areas.  Fever and chills.  Bloating.  Cramping.  Nausea.  Vomiting.  Changes in bowel routines.  Blood in your stool. How is this diagnosed? This condition is diagnosed based on:  Your medical history.  A physical exam.  Tests to make sure there is nothing else causing your condition. These tests may include: ? Blood tests. ? Urine tests. ? Imaging tests of the abdomen, including X-rays, ultrasounds, MRIs, or CT scans. How is this treated? Most cases of this condition are mild and can be treated  at home. Treatment may include:  Taking over-the-counter pain medicines.  Following a clear liquid diet.  Taking antibiotic medicines by mouth.  Rest. More severe cases may need to be treated at a hospital. Treatment may include:  Not eating or drinking.  Taking prescription pain medicine.  Receiving antibiotic medicines through an IV tube.  Receiving fluids and nutrition through an IV tube.  Surgery. When your condition is under control, your health care provider may recommend that you have a colonoscopy. This is an exam to look at the entire large intestine. During the exam, a lubricated, bendable tube is inserted into the anus and then passed into the rectum, colon, and other parts of the large intestine. A colonoscopy can show how severe your diverticula are and whether something else may be causing your symptoms. Follow these instructions at home: Medicines  Take over-the-counter and prescription medicines only as told by your health care provider. These include fiber supplements, probiotics, and stool softeners.  If you were prescribed an antibiotic medicine, take it as told by your health care provider. Do not stop taking the antibiotic even if you start to feel better.  Do not drive or use heavy machinery while taking prescription pain medicine. General  instructions   Follow a full liquid diet or another diet as directed by your health care provider. After your symptoms improve, your health care provider may tell you to change your diet. He or she may recommend that you eat a diet that contains at least 25 g (25 grams) of fiber daily. Fiber makes it easier to pass stool. Healthy sources of fiber include: ? Berries. One cup contains 4-8 grams of fiber. ? Beans or lentils. One half cup contains 5-8 grams of fiber. ? Green vegetables. One cup contains 4 grams of fiber.  Exercise for at least 30 minutes, 3 times each week. You should exercise hard enough to raise your heart  rate and break a sweat.  Keep all follow-up visits as told by your health care provider. This is important. You may need a colonoscopy. Contact a health care provider if:  Your pain does not improve.  You have a hard time drinking or eating food.  Your bowel movements do not return to normal. Get help right away if:  Your pain gets worse.  Your symptoms do not get better with treatment.  Your symptoms suddenly get worse.  You have a fever.  You vomit more than one time.  You have stools that are bloody, black, or tarry. Summary  Diverticulitis is infection or inflammation of small pouches (diverticula) in the colon that form due to a condition called diverticulosis. Diverticula can trap stool (feces) and bacteria, causing infection and inflammation.  You are at higher risk for this condition if you have diverticulosis and you eat a diet that does not include enough fiber.  Most cases of this condition are mild and can be treated at home. More severe cases may need to be treated at a hospital.  When your condition is under control, your health care provider may recommend that you have an exam called a colonoscopy. This exam can show how severe your diverticula are and whether something else may be causing your symptoms. This information is not intended to replace advice given to you by your health care provider. Make sure you discuss any questions you have with your health care provider. Document Released: 07/14/2005 Document Revised: 09/16/2017 Document Reviewed: 11/06/2016 [employer] Patient Education  2020 [employer].

## 2019-06-16 NOTE — Discharge Summary (Signed)
Physician Discharge Summary  ASTER SCREWS BPZ:[id number] DOB: September 22, 1941  PCP: Everardo Beals, NP  Admitted from: Home  Discharged to: Home  Admit date: 06/06/2019 Discharge date: 06/16/2019  Recommendations for Outpatient Follow-up:   Follow-up Information    Everardo Beals, NP. Schedule an appointment as soon as possible for a visit in 1 week(s).   Why: To be seen with repeat labs (CBC & BMP). Contact information: [place] [address] [phone]        Ladene Artist, MD. Schedule an appointment as soon as possible for a visit.   Specialty: Gastroenterology Contact information: [address] [place] [address] [phone]        Nephrologist (Kidney Doctor),? Dr. Grayland Ormond. Schedule an appointment as soon as possible for a visit in 2 week(s).            Home Health: None Equipment/Devices: None  Discharge Condition: Improved and stable CODE STATUS: DNR Diet recommendation: Soft and low fiber diet for the next 2 weeks and then gradually advance to high-fiber diet.  Discharge Diagnoses:  Principal Problem:   Diverticulitis Active Problems:   Essential hypertension   Hyperlipidemia   CKD (chronic kidney disease) stage 4, GFR 15-29 ml/min (HCC)   Chronic pain syndrome   Depression with anxiety   Abnormal CT scan, colon   Diarrhea   Brief Summary: 78 year old female, lives with her boyfriend, sister lives next door, ambulates with the help of a walker, with PMH of PAD, HTN, HLD, CKD presented due to left lower quadrant abdominal pain, nausea, vomiting, fever and diarrhea.  CT abdomen showed diverticulitis without complication.  [facility] consulted.   Flexible sigmoidoscopy showed severe sigmoid diverticulitis.   Assessment & Plan:   Acute sigmoid diverticulitis  [facility] follow-up evaluated and signed off 8/26.  Flexible sigmoidoscopy 8/25 showed sigmoid diverticulitis with significant narrowing and spasm  but no evidence of colitis.  GI panel negative.  Blood cultures x2: Negative to date.  Stools or clinical picture not consistent with C. difficile, order canceled.  Treated empirically with IV Zosyn and completed approximately 10 days course.  She was slow to respond but did gradually respond.  Her diet was gradually advanced.  She has been without abdominal pain since yesterday, tolerating soft diet, having couple of loose BMs without blood and no fever or chills.  Transitioned to Augmentin to complete total 14 days course.  Outpatient follow-up with GI.  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  Hold oral iron until acute diverticulitis resolved.  S/p IV Feraheme x2 doses.  Hemoglobin has been stable in the high 7 range for the last couple days but up to 8.5 today.    Follow CBCs as outpatient.  Consider Epogen.  Barrett's esophagus with esophagitis  Patient received Protonix 40 mg twice daily while hospitalized which will be changed to 40 mg once daily at discharge as per GI recommendations.  Stage IV chronic kidney disease  Baseline creatinine fluctuates and not clear but may be in the 1.7-2 range.  Creatinine has improved to 1.57 >1.59.  Slightly increased to 1.7 today.  Patient states that she follows up with a Nephrologist "Dr. Grayland Ormond" but unable to say where and reports that she has not seen him in a long time.  Discussed with patient and her son and advised to arrange outpatient  follow-up.  NAG metabolic acidosis  Treated with oral bicarbonate supplements.  Likely complicated by diverticulitis related diarrhea.  Gradually improved and eventually resolved.  No bicarbonate at discharge.  Hyperkalemia  Resolved after a dose of Veltassa.    Essential hypertension  Blood pressures were uncontrolled in the hospital.   Antihypertensives were adjusted.  Currently on BiDil 2 tabs 3 times daily and metoprolol was increased from 12.5 mg twice daily to 50 mg twice daily.  Mildly uncontrolled which may be related to some pain earlier on and hospitalization.  Close outpatient follow-up and adjust medications as needed.  She seem to be on duplication of medications i.e. hydralazine and Imdur which were discontinued.  No ACEI/ARB due to chronic kidney disease.  Hyperlipidemia  Continue Crestor.  PAD  Aspirin and Plavix were held in the hospital and will be resumed at discharge.  Chronic pain  Continue prior home dose of gabapentin  Depression/anxiety/?  Dementia  Continue Aricept and home regimen.  Estimated body mass index is 18.11 kg/m as calculated from the following:   Height as of this encounter: 5\' 2"  (1.575 m).   Weight as of this encounter: 44.9 kg.    Nutritional Status Nutrition Problem: Inadequate oral intake Etiology: altered GI function, poor appetite(diverticultis) Signs/Symptoms: meal completion < 50% Interventions: Ensure Enlive (each supplement provides 350kcal and 20 grams of protein), Magic cup, MVI   Consultants:  [facility]  Procedures:  Flexible sigmoidoscopy 06/12/2019:  Impression:  - Severe diverticulosis in the sigmoid colon. There was narrowing of the colon in association with the diverticular opening. There was evidence of severe diverticular spasm. There was evidence of an impacted diverticulum. There was no evidence of diverticular bleeding. - The rectum is normal. - No specimens collected. -Suspected diverticulitis.  Discharge Instructions  Discharge Instructions    Call MD for:  difficulty breathing, headache or visual disturbances   Complete by: As directed    Call MD for:  extreme fatigue   Complete by: As directed    Call MD for:  persistant dizziness or light-headedness   Complete by: As directed    Call MD for:  persistant nausea  and vomiting   Complete by: As directed    Call MD for:  severe uncontrolled pain   Complete by: As directed    Call MD for:  temperature >100.4   Complete by: As directed    Diet - low sodium heart healthy   Complete by: As directed    Soft and low fiber diet for 2 weeks and then gradually advance to high-fiber diet.   Increase activity slowly   Complete by: As directed        Medication List    STOP taking these medications   furosemide 40 MG tablet Commonly known as: Lasix   hydrALAZINE 25 MG tablet Commonly known as: APRESOLINE   isosorbide mononitrate 60 MG 24 hr tablet Commonly known as: IMDUR   Potassium Chloride ER 20 MEQ Tbcr     TAKE these medications   acetaminophen 325 MG tablet Commonly known as: TYLENOL Take 2 tablets (650 mg total) by mouth every 6 (six) hours as needed for mild pain, moderate pain or fever (or Fever >/= 101).   amoxicillin-clavulanate 500-125 MG tablet Commonly known as: Augmentin Take 1 tablet (500 mg total) by mouth 2 (two) times daily for 4 days.   aspirin 81 MG EC tablet Take 1 tablet (81 mg total) by mouth daily.   busPIRone 15 MG tablet Commonly  known as: BUSPAR Take 15 mg by mouth 3 (three) times daily as needed for anxiety.   citalopram 20 MG tablet Commonly known as: CeleXA Take 1 tablet (20 mg total) by mouth daily.   clonazePAM 1 MG tablet Commonly known as: KLONOPIN Take 0.5 tablets (0.5 mg total) by mouth at bedtime.   clopidogrel 75 MG tablet Commonly known as: PLAVIX Take 1 tablet (75 mg total) by mouth daily.   donepezil 5 MG tablet Commonly known as: ARICEPT Take 5 mg by mouth daily.   ergocalciferol 1.25 MG (50000 UT) capsule Commonly known as: VITAMIN D2 Take 50,000 Units by mouth every Monday.   feeding supplement (ENSURE ENLIVE) Liqd Take 237 mLs by mouth 2 (two) times daily between meals.   gabapentin 100 MG capsule Commonly known as: NEURONTIN Take 1 capsule (100 mg total) by mouth 3 (three)  times daily.   Iron High-Potency 325 MG Tabs Take 325 mg by mouth daily with breakfast. Start taking on: June 30, 2019 What changed: These instructions start on June 30, 2019. If you are unsure what to do until then, ask your doctor or other care provider.   isosorbide-hydrALAZINE 20-37.5 MG tablet Commonly known as: BIDIL Take 2 tablets by mouth 3 (three) times daily.   magnesium oxide 400 (241.3 Mg) MG tablet Commonly known as: MAG-OX Take 1 tablet (400 mg total) by mouth 2 (two) times daily.   megestrol 40 MG tablet Commonly known as: MEGACE Take 40 mg by mouth 2 (two) times daily.   metoprolol tartrate 25 MG tablet Commonly known as: LOPRESSOR Take 2 tablets (50 mg total) by mouth 2 (two) times daily. What changed: how much to take   multivitamin with minerals Tabs tablet Take 1 tablet by mouth daily. Start taking on: June 17, 2019   pantoprazole 40 MG tablet Commonly known as: PROTONIX Take 1 tablet (40 mg total) by mouth daily.   rosuvastatin 40 MG tablet Commonly known as: CRESTOR Take 1 tablet (40 mg total) by mouth at bedtime.   valproic acid 250 MG capsule Commonly known as: DEPAKENE Take 1 capsule (250 mg total) by mouth 2 (two) times daily.      No Known Allergies    Procedures/Studies: Ct Abdomen Pelvis Wo Contrast  Result Date: 06/07/2019 CLINICAL DATA:  Left-sided abdominal pain for 2 days EXAM: CT ABDOMEN AND PELVIS WITHOUT CONTRAST TECHNIQUE: Multidetector CT imaging of the abdomen and pelvis was performed following the standard protocol without IV contrast. COMPARISON:  03/09/2019 FINDINGS: Lower chest: Lung bases are free of acute infiltrate or sizable effusion. Hepatobiliary: The liver and gallbladder are within normal limits. Pancreas: Pancreatic atrophy is identified Spleen: Normal in size without focal abnormality. Adrenals/Urinary Tract: Adrenal glands are within normal limits. No renal calculi or obstructive changes are seen. The  bladder is well distended. Stomach/Bowel: Contrast is noted throughout the colon. Changes of diverticulosis are seen. Wall thickening of the descending and sigmoid colon is seen without definitive perforation or abscess formation. The appendix is unremarkable. Small bowel shows no obstructive changes. Stomach shows evidence of a hiatal hernia. Some distal esophageal thickening is noted as well which may be related to reflux. Clinical correlation is recommended. Vascular/Lymphatic: Aortic atherosclerosis. No enlarged abdominal or pelvic lymph nodes. Reproductive: Uterus and bilateral adnexa are unremarkable. Other: No abdominal wall hernia or abnormality. No abdominopelvic ascites. Musculoskeletal: Changes of prior right hip replacement are seen. Degenerative changes of lumbar spine are noted. Changes of prior fusion at L4-5 are seen. IMPRESSION: Distal  colonic wall thickening consistent with mild colitis. No perforation or abscess is identified. Hiatal hernia with distal esophageal thickening likely related to reflux. Clinical correlation is recommended. Chronic changes as described above. Electronically Signed   By: Inez Catalina M.D.   On: 06/07/2019 10:18      Subjective: Patient denies complaints.  States that she has not had abdominal pain since yesterday.  Has tolerated soft food without nausea or vomiting.  Having a couple of loose BMs without blood.  As per RN, no acute issues and ambulated steadily, independently with her walker as at baseline.  Discharge Exam:  Vitals:   06/16/19 0622 06/16/19 0829 06/16/19 0944 06/16/19 1217  BP: (!) 174/94 (!) 192/100 (!) 168/92 (!) 151/98  Pulse:  78 75 79  Resp:  16  16  Temp:  98.8 F (37.1 C)  98.2 F (36.8 C)  TempSrc:  Oral  Oral  SpO2:  96%  99%  Weight:      Height:        General exam: Pleasant elderly female, moderately built and nourished sitting up comfortably in bed eating breakfast/pancakes this morning. Respiratory system: Clear to  auscultation.  No increased work of breathing. Cardiovascular system: S1 and S2 heard, RRR.  No JVD, murmurs or pedal edema. Gastrointestinal system: Abdomen is nondistended, soft and nontender.  No organomegaly or masses appreciated.  Normal bowel sounds heard. Central nervous system: Alert and oriented.  No focal neurological deficits. Extremities: Symmetric 5 x 5 power. Skin: No rashes, lesions or ulcers Psychiatry: Judgement and insight appear normal. Mood & affect appropriate    The results of significant diagnostics from this hospitalization (including imaging, microbiology, ancillary and laboratory) are listed below for reference.     Microbiology: Recent Results (from the past 240 hour(s))  SARS CORONAVIRUS 2 Nasal Swab Aptima Multi Swab     Status: None   Collection Time: 06/07/19  5:30 AM   Specimen: Aptima Multi Swab; Nasal Swab  Result Value Ref Range Status   SARS Coronavirus 2 NEGATIVE NEGATIVE Final    Comment: (NOTE) SARS-CoV-2 target nucleic acids are NOT DETECTED. The SARS-CoV-2 RNA is generally detectable in upper and lower respiratory specimens during the acute phase of infection. Negative results do not preclude SARS-CoV-2 infection, do not rule out co-infections with other pathogens, and should not be used as the sole basis for treatment or other patient management decisions. Negative results must be combined with clinical observations, patient history, and epidemiological information. The expected result is Negative. Fact Sheet for Patients: SugarRoll.be Fact Sheet for Healthcare Providers: https://www.woods-mathews.com/ This test is not yet approved or cleared by the [place] FDA and  has been authorized for detection and/or diagnosis of SARS-CoV-2 by FDA under an Emergency Use Authorization (EUA). This EUA will remain  in effect (meaning this test can be used) for the duration of the COVID-19 declaration under  Section 56 4(b)(1) of the Act, 21 U.S.C. section 360bbb-3(b)(1), unless the authorization is terminated or revoked sooner. Performed at [facility], [place] [address], [place], [place] [address]   Culture, blood (routine x 2)     Status: None   Collection Time: 06/08/19 11:19 AM   Specimen: BLOOD RIGHT HAND  Result Value Ref Range Status   Specimen Description BLOOD RIGHT HAND  Final   Special Requests   Final    BOTTLES DRAWN AEROBIC ONLY Blood Culture adequate volume   Culture   Final    NO GROWTH 5 DAYS Performed  at [facility], [place] [address], [place], [place] [address]    Report Status 06/13/2019 FINAL  Final  Culture, blood (routine x 2)     Status: None   Collection Time: 06/08/19 11:24 AM   Specimen: BLOOD  Result Value Ref Range Status   Specimen Description BLOOD LEFT ANTECUBITAL  Final   Special Requests   Final    BOTTLES DRAWN AEROBIC ONLY Blood Culture adequate volume   Culture   Final    NO GROWTH 5 DAYS Performed at [facility], [place] [address], [place], [place] [address]    Report Status 06/13/2019 FINAL  Final  Gastrointestinal Panel by PCR , Stool     Status: None   Collection Time: 06/10/19  4:12 PM   Specimen: Stool  Result Value Ref Range Status   Campylobacter species NOT DETECTED NOT DETECTED Final   Plesimonas shigelloides NOT DETECTED NOT DETECTED Final   Salmonella species NOT DETECTED NOT DETECTED Final   Yersinia enterocolitica NOT DETECTED NOT DETECTED Final   Vibrio species NOT DETECTED NOT DETECTED Final   Vibrio cholerae NOT DETECTED NOT DETECTED Final   Enteroaggregative E coli (EAEC) NOT DETECTED NOT DETECTED Final   Enteropathogenic E coli (EPEC) NOT DETECTED NOT DETECTED Final   Enterotoxigenic E coli (ETEC) NOT DETECTED NOT DETECTED Final   Shiga like toxin producing E coli (STEC) NOT DETECTED NOT DETECTED Final   Shigella/Enteroinvasive E coli (EIEC) NOT DETECTED NOT DETECTED Final   Cryptosporidium NOT DETECTED NOT  DETECTED Final   Cyclospora cayetanensis NOT DETECTED NOT DETECTED Final   Entamoeba histolytica NOT DETECTED NOT DETECTED Final   Giardia lamblia NOT DETECTED NOT DETECTED Final   Adenovirus F40/41 NOT DETECTED NOT DETECTED Final   Astrovirus NOT DETECTED NOT DETECTED Final   Norovirus GI/GII NOT DETECTED NOT DETECTED Final   Rotavirus A NOT DETECTED NOT DETECTED Final   Sapovirus (I, II, IV, and V) NOT DETECTED NOT DETECTED Final    Comment: Performed at [facility], [place] [place], [place] [address]     Labs: CBC: Recent Labs  Lab 06/12/19 0158 06/13/19 0716 06/14/19 0449 06/15/19 0504 06/16/19 0411  WBC 7.1 7.5 8.0 7.6 8.4  NEUTROABS 4.5 4.7 4.7 4.5 5.1  HGB 7.6* 8.1* 7.7* 7.8* 8.5*  HCT 22.8* 24.2* 23.6* 23.3* 25.2*  MCV 87.7 88.3 88.1 87.6 86.9  PLT 470* 476* 471* 442* 335*   Basic Metabolic Panel: Recent Labs  Lab 06/12/19 0158 06/13/19 0403 06/14/19 0449 06/15/19 0504 06/16/19 0411  NA 140 139 139 136 138  K 3.7 3.8 3.9 3.8 3.7  CL 115* 113* 110 105 102  CO2 15* 15* 19* 21* 24  GLUCOSE 104* 95 101* 100* 104*  BUN 22 19 16 13 17   CREATININE 1.72* 1.79* 1.57* 1.59* 1.70*  CALCIUM 8.8* 8.8* 9.1 9.0 9.2   Liver Function Tests: No results for input(s): AST, ALT, ALKPHOS, BILITOT, PROT, ALBUMIN in the last 168 hours. Urinalysis    Component Value Date/Time   COLORURINE YELLOW 06/06/2019 2115   APPEARANCEUR CLEAR 06/06/2019 2115   LABSPEC 1.013 06/06/2019 2115   PHURINE 5.0 06/06/2019 2115   GLUCOSEU NEGATIVE 06/06/2019 2115   HGBUR NEGATIVE 06/06/2019 2115   BILIRUBINUR NEGATIVE 06/06/2019 2115   KETONESUR NEGATIVE 06/06/2019 2115   PROTEINUR 100 (A) 06/06/2019 2115   UROBILINOGEN 0.2 08/24/2015 1737   NITRITE NEGATIVE 06/06/2019 2115   LEUKOCYTESUR NEGATIVE 06/06/2019 2115    I discussed in detail with  patient's son via phone, updated care and answered questions.  Time coordinating discharge: 45 minutes  SIGNED:  Vernell Leep, MD, FACP, [facility]. [facility]  To contact the attending provider between 7A-7P or the covering provider during after hours 7P-7A, please log into the web site [email or web address] and access using universal [place] password for that web site. If you do not have the password, please call the hospital operator.

## 2019-06-16 NOTE — Progress Notes (Signed)
Pt is A&O x4, ambulating with a walker, with steady gait. Discharge instructions given to pt. Discharged to home.

## 2019-06-22 ENCOUNTER — Emergency Department (HOSPITAL_COMMUNITY)
Admission: EM | Admit: 2019-06-22 | Discharge: 2019-06-22 | Disposition: A | Discharge status: Home / Self Care | Payer: Medicare HMO | Attending: Emergency Medicine | Admitting: Emergency Medicine

## 2019-06-22 ENCOUNTER — Encounter (HOSPITAL_COMMUNITY): Payer: Self-pay | Admitting: Emergency Medicine

## 2019-06-22 ENCOUNTER — Other Ambulatory Visit: Payer: Self-pay

## 2019-06-22 DIAGNOSIS — Z79899 Other long term (current) drug therapy: Secondary | ICD-10-CM | POA: Insufficient documentation

## 2019-06-22 DIAGNOSIS — Z87891 Personal history of nicotine dependence: Secondary | ICD-10-CM | POA: Insufficient documentation

## 2019-06-22 DIAGNOSIS — Z7982 Long term (current) use of aspirin: Secondary | ICD-10-CM | POA: Diagnosis not present

## 2019-06-22 DIAGNOSIS — F329 Major depressive disorder, single episode, unspecified: Secondary | ICD-10-CM

## 2019-06-22 DIAGNOSIS — Z20828 Contact with and (suspected) exposure to other viral communicable diseases: Secondary | ICD-10-CM | POA: Insufficient documentation

## 2019-06-22 DIAGNOSIS — F332 Major depressive disorder, recurrent severe without psychotic features: Secondary | ICD-10-CM | POA: Diagnosis not present

## 2019-06-22 DIAGNOSIS — N189 Chronic kidney disease, unspecified: Secondary | ICD-10-CM | POA: Diagnosis not present

## 2019-06-22 DIAGNOSIS — I129 Hypertensive chronic kidney disease with stage 1 through stage 4 chronic kidney disease, or unspecified chronic kidney disease: Secondary | ICD-10-CM | POA: Diagnosis not present

## 2019-06-22 DIAGNOSIS — F32A Depression, unspecified: Secondary | ICD-10-CM

## 2019-06-22 DIAGNOSIS — Z7901 Long term (current) use of anticoagulants: Secondary | ICD-10-CM | POA: Insufficient documentation

## 2019-06-22 LAB — ACETAMINOPHEN LEVEL: Acetaminophen (Tylenol), Serum: <10 ug/mL — ABNORMAL LOW (ref 10–30)

## 2019-06-22 LAB — COMPREHENSIVE METABOLIC PANEL
ALT: 14 U/L (ref 0–44)
AST: 21 U/L (ref 15–41)
Albumin: 3.7 g/dL (ref 3.5–5.0)
Alkaline Phosphatase: 80 U/L (ref 38–126)
Anion gap: 15 (ref 5–15)
BUN: 21 mg/dL (ref 8–23)
CO2: 17 mmol/L — ABNORMAL LOW (ref 22–32)
Calcium: 9.6 mg/dL (ref 8.9–10.3)
Chloride: 107 mmol/L (ref 98–111)
Creatinine, Ser: 1.82 mg/dL — ABNORMAL HIGH (ref 0.44–1.00)
GFR calc Af Amer: 31 mL/min — ABNORMAL LOW (ref >60)
GFR calc non Af Amer: 27 mL/min — ABNORMAL LOW (ref >60)
Glucose, Bld: 118 mg/dL — ABNORMAL HIGH (ref 70–99)
Potassium: 3.7 mmol/L (ref 3.5–5.1)
Sodium: 139 mmol/L (ref 135–145)
Total Bilirubin: 0.2 mg/dL — ABNORMAL LOW (ref 0.3–1.2)
Total Protein: 7.3 g/dL (ref 6.5–8.1)

## 2019-06-22 LAB — SARS CORONAVIRUS 2 BY RT PCR (HOSPITAL ORDER, PERFORMED IN ~~LOC~~ HOSPITAL LAB): SARS Coronavirus 2: NEGATIVE

## 2019-06-22 LAB — CBC WITH DIFFERENTIAL/PLATELET
Abs Immature Granulocytes: 0.07 10*3/uL (ref 0.00–0.07)
Basophils Absolute: 0.1 10*3/uL (ref 0.0–0.1)
Basophils Relative: 1 %
Eosinophils Absolute: 0.1 10*3/uL (ref 0.0–0.5)
Eosinophils Relative: 1 %
HCT: 28.2 % — ABNORMAL LOW (ref 36.0–46.0)
Hemoglobin: 9 g/dL — ABNORMAL LOW (ref 12.0–15.0)
Immature Granulocytes: 1 %
Lymphocytes Relative: 17 %
Lymphs Abs: 1.5 10*3/uL (ref 0.7–4.0)
MCH: 29 pg (ref 26.0–34.0)
MCHC: 31.9 g/dL (ref 30.0–36.0)
MCV: 91 fL (ref 80.0–100.0)
Monocytes Absolute: 0.8 10*3/uL (ref 0.1–1.0)
Monocytes Relative: 8 %
Neutro Abs: 6.4 10*3/uL (ref 1.7–7.7)
Neutrophils Relative %: 72 %
Platelets: 491 10*3/uL — ABNORMAL HIGH (ref 150–400)
RBC: 3.1 MIL/uL — ABNORMAL LOW (ref 3.87–5.11)
RDW: 15 % (ref 11.5–15.5)
WBC: 8.9 10*3/uL (ref 4.0–10.5)
nRBC: 0 % (ref 0.0–0.2)

## 2019-06-22 LAB — RAPID URINE DRUG SCREEN, HOSP PERFORMED
Amphetamines: NOT DETECTED
Barbiturates: NOT DETECTED
Benzodiazepines: NOT DETECTED
Cocaine: NOT DETECTED
Opiates: NOT DETECTED
Tetrahydrocannabinol: POSITIVE — AB

## 2019-06-22 LAB — ETHANOL: Alcohol, Ethyl (B): <10 mg/dL (ref <10)

## 2019-06-22 LAB — SALICYLATE LEVEL: Salicylate Lvl: 12.9 mg/dL (ref 2.8–30.0)

## 2019-06-22 MED ORDER — HYDROXYZINE HCL 25 MG PO TABS
25.0000 mg | ORAL_TABLET | Freq: Once | ORAL | Status: AC
  Administered 2019-06-22: 25 mg via ORAL
  Filled 2019-06-22: qty 1

## 2019-06-22 NOTE — BH Assessment (Signed)
Tele Assessment Note   [name] MRN: [id number] Referring Physician: Ronnald Nian Location of Patient: [facility] Location of Provider: [place] is an 77 y.o. female presenting voluntarily to [facility] ED complaining of depression. She reports that she has struggled with depression for many years but the past 3 days it has become more severe. She endorses depressive symptoms including hopelessness, isolation, crying spells, fatigue, irritability, guilt, and anhedonia. She denies SI/HI/AVH. She reports 2 prior psychiatric hospitalizations for depression. No prior suicide attempts. She denies any substance use. Patient gave permission for TTS to contact her Son, Alyssa [name], and friend, [name], for collateral information if necessary.  Patient is alert and oriented x 4. She is dressed in scrubs, laying in bed. Her speech is logical, eye contact is good, and thoughts are organized. Patient's mood is depressed and affect is congruent. Patient does not appear to be responding to internal stimuli or experiencing delusional thought content.  Diagnosis: F33.2 MDD, recurrent, severe  Past Medical History:  Past Medical History:  Diagnosis Date  . Anxiety   . Arthritis   . Chest pain   . Chronic kidney disease   . Elevated troponin 12/13/2018  . Headache(784.0)   . Hyperlipidemia   . Hypertension   . Joint pain   . Leg pain   . Peripheral neuropathy   . Peripheral vascular disease [facility])     Past Surgical History:  Procedure Laterality Date  . ABDOMINAL ANGIOGRAM N/A 10/29/2011   Procedure: ABDOMINAL ANGIOGRAM;  Surgeon: Elam Dutch, MD;  Location: [facility] CATH LAB;  Service: Cardiovascular;  Laterality: N/A;  . BACK SURGERY    . FEMORAL-FEMORAL BYPASS GRAFT  08/31/2010  . FLEXIBLE SIGMOIDOSCOPY N/A 06/12/2019   Procedure: FLEXIBLE SIGMOIDOSCOPY;  Surgeon: Ladene Artist, MD;  Location: [facility] ENDOSCOPY;  Service: Endoscopy;   Laterality: N/A;  Patient had little bit to eat this morning so this will be unsedated.  Marland Kitchen JOINT REPLACEMENT Right 2012   Hip  . LOWER EXTREMITY ANGIOGRAPHY  06/14/2017   Procedure: Lower Extremity Angiography;  Surgeon: Adrian Prows, MD;  Location: [place] CV LAB;  Service: Cardiovascular;;  Bilateral limited angio performed  . RENAL ANGIOGRAPHY N/A 06/14/2017   Procedure: Renal Angiography;  Surgeon: Adrian Prows, MD;  Location: [place] CV LAB;  Service: Cardiovascular;  Laterality: N/A;  . TOTAL HIP ARTHROPLASTY     right    Family History:  Family History  Problem Relation Age of Onset  . Heart attack Mother   . Heart disease Mother   . Hyperlipidemia Mother   . Hypertension Mother   . Heart attack Father   . Heart disease Father        Before age 33  . Hyperlipidemia Father   . Hypertension Father   . Cancer Sister        brain tumor  . Aneurysm Brother        brain  . Colon cancer Neg Hx   . Liver cancer Neg Hx     Social History:  reports that she quit smoking about 6 months ago. Her smoking use included cigarettes. She has a 27.00 pack-year smoking history. She has never used smokeless tobacco. She reports that she does not drink alcohol or use drugs.  Additional Social History:  Alcohol / Drug Use Pain Medications: see MAR Prescriptions: see MAR Over the Counter: see MAR History of alcohol / drug use?: No history of alcohol / drug  abuse  CIWA: CIWA-Ar BP: (!) 177/91 Pulse Rate: 88 COWS:    Allergies: No Known Allergies  Home Medications: (Not in a hospital admission)   OB/GYN Status:  No LMP recorded. Patient is postmenopausal.  General Assessment Data Location of Assessment: [facility] TTS Assessment: In system Is this a Tele or Face-to-Face Assessment?: Tele Assessment Is this an Initial Assessment or a Re-assessment for this encounter?: Initial Assessment Patient Accompanied by:: N/A Language Other than English: No Living Arrangements: (her  home) What gender do you identify as?: Female Marital status: Long term relationship Maiden name: Therapist, music Pregnancy Status: No Living Arrangements: Other relatives Can pt return to current living arrangement?: Yes Admission Status: Voluntary Is patient capable of signing voluntary admission?: Yes Referral Source: Self/Family/Friend Insurance type: Medicare     Crisis Care Plan Living Arrangements: Other relatives Legal Guardian: (self) Name of Psychiatrist: none Name of Therapist: none  Education Status Is patient currently in school?: No Is the patient employed, unemployed or receiving disability?: Unemployed  Risk to self with the past 6 months Suicidal Ideation: No Has patient been a risk to self within the past 6 months prior to admission? : No Suicidal Intent: No Has patient had any suicidal intent within the past 6 months prior to admission? : No Is patient at risk for suicide?: No Suicidal Plan?: No Has patient had any suicidal plan within the past 6 months prior to admission? : No Access to Means: No What has been your use of drugs/alcohol within the last 12 months?: none Previous Attempts/Gestures: No How many times?: 0 Other Self Harm Risks: none  Triggers for Past Attempts: None known Intentional Self Injurious Behavior: None Family Suicide History: No Recent stressful life event(s): Recent negative physical changes Persecutory voices/beliefs?: No Depression: Yes Depression Symptoms: Despondent, Insomnia, Tearfulness, Isolating, Fatigue, Guilt, Loss of interest in usual pleasures, Feeling worthless/self pity, Feeling angry/irritable Substance abuse history and/or treatment for substance abuse?: No Suicide prevention information given to non-admitted patients: Not applicable  Risk to Others within the past 6 months Homicidal Ideation: No Does patient have any lifetime risk of violence toward others beyond the six months prior to admission? : No Thoughts  of Harm to Others: No Current Homicidal Intent: No Current Homicidal Plan: No Access to Homicidal Means: No Identified Victim: none History of harm to others?: No Violent Behavior Description: none Does patient have access to weapons?: No Criminal Charges Pending?: No Does patient have a court date: No Is patient on probation?: No  Psychosis Hallucinations: None noted Delusions: None noted  Mental Status Report Appearance/Hygiene: In scrubs Eye Contact: Good Motor Activity: Freedom of movement Speech: Logical/coherent Level of Consciousness: Alert Mood: Depressed Affect: Depressed Anxiety Level: None Thought Processes: Coherent, Relevant Judgement: Impaired Orientation: Person, Place, Time, Situation Obsessive Compulsive Thoughts/Behaviors: None  Cognitive Functioning Concentration: Normal Memory: Recent Intact, Remote Intact Is patient IDD: No Insight: Fair Impulse Control: Fair Appetite: Poor Have you had any weight changes? : No Change Sleep: Decreased Total Hours of Sleep: 0 Vegetative Symptoms: None  ADLScreening [facility] Assessment Services) Patient's cognitive ability adequate to safely complete daily activities?: Yes Patient able to express need for assistance with ADLs?: Yes Independently performs ADLs?: Yes (appropriate for developmental age)  Prior Inpatient Therapy Prior Inpatient Therapy: Yes Prior Therapy Dates: 2016 Prior Therapy Facilty/Provider(s): [facility] Reason for Treatment: depression  Prior Outpatient Therapy Prior Outpatient Therapy: No Does patient have an ACCT team?: No Does patient have Intensive In-House Services?  : No Does patient  have [facility] services? : No Does patient have P4CC services?: No  ADL Screening (condition at time of admission) Patient's cognitive ability adequate to safely complete daily activities?: Yes Is the patient deaf or have difficulty hearing?: No Does the patient have difficulty seeing, even when wearing  glasses/contacts?: No Does the patient have difficulty concentrating, remembering, or making decisions?: No Patient able to express need for assistance with ADLs?: Yes Does the patient have difficulty dressing or bathing?: No Independently performs ADLs?: Yes (appropriate for developmental age) Does the patient have difficulty walking or climbing stairs?: Yes Weakness of Legs: None Weakness of Arms/Hands: Both  Home Assistive Devices/Equipment Home Assistive Devices/Equipment: Environmental consultant (specify type), Eyeglasses  Therapy Consults (therapy consults require a physician order) PT Evaluation Needed: No OT Evalulation Needed: No SLP Evaluation Needed: No Abuse/Neglect Assessment (Assessment to be complete while patient is alone) Physical Abuse: Denies Verbal Abuse: Denies Sexual Abuse: Denies Exploitation of patient/patient's resources: Denies Self-Neglect: Denies Values / Beliefs Cultural Requests During Hospitalization: None Spiritual Requests During Hospitalization: None Consults Spiritual Care Consult Needed: No Social Work Consult Needed: No Regulatory affairs officer (For Healthcare) Does Patient Have a Medical Advance Directive?: Yes Type of Advance Directive: Living will          Disposition: Per Jinny Blossom, PMHNP patient does not meet in patient criteria. Discharge with outpatient resources. Anderson Malta, RN informed of disposition. Disposition Initial Assessment Completed for this Encounter: Yes Patient referred to: Outpatient clinic referral  This service was provided via telemedicine using a 2-way, interactive audio and video technology.  Names of all persons participating in this telemedicine service and their role in this encounter. Name: Orvis Brill, LCSW Role: TTS  Name: Alyssa [name] Role: patient  Name:  Role:   Name:  Role:     Orvis Brill 06/22/2019 4:39 PM

## 2019-06-22 NOTE — Discharge Instructions (Addendum)
Follow-up with psychiatry outpatient

## 2019-06-22 NOTE — ED Notes (Signed)
ED Provider at bedside. 

## 2019-06-22 NOTE — ED Notes (Signed)
TTS responded and are speaking with pt at this time. Pt has roll walker at bedside.

## 2019-06-22 NOTE — ED Provider Notes (Addendum)
Patient signed out to me at 3 PM.  Patient sent over from behavioral health for medical clearance.  Here with depression.  Medical clearance labs have been ordered.  Awaiting evaluation by psychiatry.  Patient here voluntarily.  Patient with overall unremarkable lab work.  UDS positive for marijuana.  Medically cleared at this time.  Awaiting psychiatric evaluation.  Coronavirus test is negative.  Patient here voluntarily.  Psychiatry recommends outpatient treatment.  Discharged in good condition.  This chart was dictated using voice recognition software.  Despite best efforts to proofread,  errors can occur which can change the documentation meaning.     Lennice Sites, DO 06/22/19 [place], [place], DO 06/22/19 1752

## 2019-06-22 NOTE — ED Notes (Signed)
Patient wanded by security. 

## 2019-06-22 NOTE — ED Provider Notes (Signed)
[facility] DEPT Provider Note   CSN: [id number] Arrival date & time: 06/22/19  1335     History   Chief Complaint Chief Complaint  Patient presents with  . Depression  . Psychiatric Evaluation    HPI Alyssa [name] is a 78 y.o. female past medical history of NSTEMI, CHF, CKD stage IV, anemia, hypertension, PAD, CAD, depression, anxiety presents to emergency department today with chief complaint of depression.  Patient states she went to [name] to voluntarily check herself in and she was told she needed medical clearance.  She states she has been voluntarily committed years ago for similar symptoms.  She denies any suicidal or homicidal ideations.  She does state that she feels like she is at the end of her rope.  She lives at home with her friend and states she does feel safe at home.  She admits to not having slept in the last 3 days. She states she has been feeling very depressed over the last several days due to her ongoing health problems.  She denies any new symptoms or pain today.  She denies any fever, chills, chest pain, shortness of breath abdominal pain, nausea vomiting, urinary symptoms, diarrhea.     Past Medical History:  Diagnosis Date  . Anxiety   . Arthritis   . Chest pain   . Chronic kidney disease   . Elevated troponin 12/13/2018  . Headache(784.0)   . Hyperlipidemia   . Hypertension   . Joint pain   . Leg pain   . Peripheral neuropathy   . Peripheral vascular disease [facility])     Patient Active Problem List   Diagnosis Date Noted  . Abnormal CT scan, colon   . Diarrhea   . Diverticulitis 06/07/2019  . Depression with anxiety 06/07/2019  . DNR (do not resuscitate) 03/23/2019  . Volume overload 03/23/2019  . Edema 03/23/2019  . Acute renal failure (ARF) ([place]) 03/08/2019  . Hypokalemia 03/08/2019  . Palliative care by specialist   . Goals of care, counseling/discussion   . Altered mental status   . Encephalopathy, toxic  12/24/2018  . Aspiration pneumonia of both lower lobes due to gastric secretions ([place]) 12/24/2018  . Non-ST elevation (NSTEMI) myocardial infarction ([place])   . Chest pain   . Dyspnea on exertion   . Elevated troponin level 12/09/2018  . Chronic migraine 03/27/2018  . Lumbar radiculopathy 03/27/2018  . Gait abnormality 03/27/2018  . CKD (chronic kidney disease) stage 4, GFR 15-29 ml/min (HCC) 12/28/2016  . Symptomatic anemia 12/28/2016  . Chronic pain syndrome 12/28/2016  . Benzodiazepine withdrawal without complication ([place]) 03/54/6568  . Chronic right shoulder pain 12/06/2016  . Fall 09/17/2016  . Fracture of humeral head, closed, right, initial encounter 09/17/2016  . Rib fractures 09/17/2016  . Multiple falls 09/17/2016  . Severe muscle deconditioning 09/17/2016  . Failure to thrive in adult 09/17/2016  . Melena 04/25/2016  . Essential hypertension 04/25/2016  . Anxiety state 04/25/2016  . Tobacco abuse 04/25/2016  . Hyperlipidemia 04/25/2016  . Syncope 04/24/2016  . Syncope and collapse 04/24/2016  . Gait difficulty 01/12/2016  . Foot drop, left 01/12/2016  . Severe recurrent major depression without psychotic features ([place]) 08/28/2015  . Spondylolisthesis at L4-L5 level 07/30/2015  . PVD (peripheral vascular disease) with claudication ([employer]) 07/29/2014  . Weakness-Bilateral arm/leg 07/29/2014  . Numbness-left leg 07/29/2014  . Swelling of limb-Legs 07/29/2014  . Atherosclerosis of native arteries of the extremities with intermittent claudication 09/30/2011  Past Surgical History:  Procedure Laterality Date  . ABDOMINAL ANGIOGRAM N/A 10/29/2011   Procedure: ABDOMINAL ANGIOGRAM;  Surgeon: Elam Dutch, MD;  Location: [facility] CATH LAB;  Service: Cardiovascular;  Laterality: N/A;  . BACK SURGERY    . FEMORAL-FEMORAL BYPASS GRAFT  08/31/2010  . FLEXIBLE SIGMOIDOSCOPY N/A 06/12/2019   Procedure: FLEXIBLE SIGMOIDOSCOPY;  Surgeon: Ladene Artist, MD;  Location: [facility] ENDOSCOPY;   Service: Endoscopy;  Laterality: N/A;  Patient had little bit to eat this morning so this will be unsedated.  Marland Kitchen JOINT REPLACEMENT Right 2012   Hip  . LOWER EXTREMITY ANGIOGRAPHY  06/14/2017   Procedure: Lower Extremity Angiography;  Surgeon: Adrian Prows, MD;  Location: [place] CV LAB;  Service: Cardiovascular;;  Bilateral limited angio performed  . RENAL ANGIOGRAPHY N/A 06/14/2017   Procedure: Renal Angiography;  Surgeon: Adrian Prows, MD;  Location: [place] CV LAB;  Service: Cardiovascular;  Laterality: N/A;  . TOTAL HIP ARTHROPLASTY     right     OB History   No obstetric history on file.      Home Medications    Prior to Admission medications   Medication Sig Start Date End Date Taking? Authorizing Provider  acetaminophen (TYLENOL) 325 MG tablet Take 2 tablets (650 mg total) by mouth every 6 (six) hours as needed for mild pain, moderate pain or fever (or Fever >/= 101). 06/16/19   Hongalgi, Lenis Dickinson, MD  aspirin 81 MG EC tablet Take 1 tablet (81 mg total) by mouth daily. 01/09/19   Caren Griffins, MD  busPIRone (BUSPAR) 15 MG tablet Take 15 mg by mouth 3 (three) times daily as needed for anxiety. 02/28/19   [provider]  citalopram (CELEXA) 20 MG tablet Take 1 tablet (20 mg total) by mouth daily. 03/23/19 03/22/20  Shelly Coss, MD  clonazePAM (KLONOPIN) 1 MG tablet Take 0.5 tablets (0.5 mg total) by mouth at bedtime. 06/16/19   Hongalgi, Lenis Dickinson, MD  clopidogrel (PLAVIX) 75 MG tablet Take 1 tablet (75 mg total) by mouth daily. 01/09/19   Caren Griffins, MD  donepezil (ARICEPT) 5 MG tablet Take 5 mg by mouth daily.    [provider]  ergocalciferol (VITAMIN D2) 50000 units capsule Take 50,000 Units by mouth every Monday.     [provider]  feeding supplement, ENSURE ENLIVE, (ENSURE ENLIVE) LIQD Take 237 mLs by mouth 2 (two) times daily between meals. 06/16/19   Hongalgi, Lenis Dickinson, MD  Ferrous Sulfate (IRON HIGH-POTENCY) 325 MG TABS Take 325 mg by  mouth daily with breakfast. 06/30/19   Hongalgi, Lenis Dickinson, MD  gabapentin (NEURONTIN) 100 MG capsule Take 1 capsule (100 mg total) by mouth 3 (three) times daily. 03/12/19   Pokhrel, L[place]man, MD  isosorbide-hydrALAZINE (BIDIL) 20-37.5 MG tablet Take 2 tablets by mouth 3 (three) times daily.    [provider]  magnesium oxide (MAG-OX) 400 (241.3 Mg) MG tablet Take 1 tablet (400 mg total) by mouth 2 (two) times daily. 03/12/19   Pokhrel, Corrie Mckusick, MD  megestrol (MEGACE) 40 MG tablet Take 40 mg by mouth 2 (two) times daily.    [provider]  metoprolol tartrate (LOPRESSOR) 25 MG tablet Take 2 tablets (50 mg total) by mouth 2 (two) times daily. 06/16/19   Hongalgi, Lenis Dickinson, MD  Multiple Vitamin (MULTIVITAMIN WITH MINERALS) TABS tablet Take 1 tablet by mouth daily. 06/17/19   Hongalgi, Lenis Dickinson, MD  pantoprazole (PROTONIX) 40 MG tablet Take 1 tablet (40 mg total) by  mouth daily. 06/16/19   Hongalgi, Lenis Dickinson, MD  rosuvastatin (CRESTOR) 40 MG tablet Take 1 tablet (40 mg total) by mouth at bedtime. 06/16/19   Hongalgi, Lenis Dickinson, MD  valproic acid (DEPAKENE) 250 MG capsule Take 1 capsule (250 mg total) by mouth 2 (two) times daily. 01/09/19   Caren Griffins, MD    Family History Family History  Problem Relation Age of Onset  . Heart attack Mother   . Heart disease Mother   . Hyperlipidemia Mother   . Hypertension Mother   . Heart attack Father   . Heart disease Father        Before age 9  . Hyperlipidemia Father   . Hypertension Father   . Cancer Sister        brain tumor  . Aneurysm Brother        brain  . Colon cancer Neg Hx   . Liver cancer Neg Hx     Social History Social History   Tobacco Use  . Smoking status: Former Smoker    Packs/day: 1.00    Years: 27.00    Pack years: 27.00    Types: Cigarettes    Quit date: 11/22/2018    Years since quitting: 0.5  . Smokeless tobacco: Never Used  Substance Use Topics  . Alcohol use: No  . Drug use: No     Allergies    Patient has no known allergies.   Review of Systems Review of Systems  Constitutional: Negative for chills and fever.  HENT: Negative for congestion, ear discharge, ear pain, sinus pressure, sinus pain and sore throat.   Eyes: Negative for pain and redness.  Respiratory: Negative for cough and shortness of breath.   Cardiovascular: Negative for chest pain.  Gastrointestinal: Negative for abdominal pain, constipation, diarrhea, nausea and vomiting.  Genitourinary: Negative for dysuria and hematuria.  Musculoskeletal: Negative for back pain and neck pain.  Skin: Negative for wound.  Neurological: Negative for weakness, numbness and headaches.  Psychiatric/Behavioral: Positive for sleep disturbance. Negative for hallucinations, self-injury and suicidal ideas. The patient is nervous/anxious.      Physical Exam Updated Vital Signs BP (!) 177/91 (BP Location: Left Arm)   Pulse 88   Temp 98.2 F (36.8 C) (Oral)   Resp (!) 28   SpO2 99%   Physical Exam Vitals signs and nursing note reviewed.  Constitutional:      General: She is not in acute distress.    Appearance: She is not ill-appearing.  HENT:     Head: Normocephalic and atraumatic.     Right Ear: Tympanic membrane and external ear normal.     Left Ear: Tympanic membrane and external ear normal.     Nose: Nose normal.     Mouth/Throat:     Mouth: Mucous membranes are moist.     Pharynx: Oropharynx is clear.  Eyes:     General: No scleral icterus.       Right eye: No discharge.        Left eye: No discharge.     Extraocular Movements: Extraocular movements intact.     Conjunctiva/sclera: Conjunctivae normal.     Pupils: Pupils are equal, round, and reactive to light.  Neck:     Musculoskeletal: Normal range of motion.     Vascular: No JVD.  Cardiovascular:     Rate and Rhythm: Normal rate and regular rhythm.     Pulses: Normal pulses.  Radial pulses are 2+ on the right side and 2+ on the left side.      Heart sounds: Normal heart sounds.  Pulmonary:     Comments: Lungs clear to auscultation in all fields. Symmetric chest rise. No wheezing, rales, or rhonchi. Abdominal:     Comments: Abdomen is soft, non-distended, and non-tender in all quadrants. No rigidity, no guarding. No peritoneal signs.  Musculoskeletal: Normal range of motion.     Right lower leg: No edema.     Left lower leg: No edema.  Skin:    General: Skin is warm and dry.     Capillary Refill: Capillary refill takes less than 2 seconds.  Neurological:     Mental Status: She is oriented to person, place, and time.     GCS: GCS eye subscore is 4. GCS verbal subscore is 5. GCS motor subscore is 6.     Comments: Fluent speech, no facial droop.  Psychiatric:        Mood and Affect: Mood is anxious.        Behavior: Behavior normal.        Thought Content: Thought content is not paranoid or delusional. Thought content does not include homicidal or suicidal ideation. Thought content does not include homicidal or suicidal plan.        Judgment: Judgment normal.      ED Treatments / Results  Labs (all labs ordered are listed, but only abnormal results are displayed) Labs Reviewed  SARS CORONAVIRUS 2 ([facility])  COMPREHENSIVE METABOLIC PANEL  ETHANOL  RAPID URINE DRUG SCREEN, HOSP PERFORMED  CBC WITH DIFFERENTIAL/PLATELET  SALICYLATE LEVEL  ACETAMINOPHEN LEVEL    EKG None  Radiology No results found.  Procedures Procedures (including critical care time)  Medications Ordered in ED Medications - No data to display   Initial Impression / Assessment and Plan / ED Course  I have reviewed the triage vital signs and the nursing notes.  Pertinent labs & imaging results that were available during my care of the patient were reviewed by me and considered in my medical decision making (see chart for details).  Patient seen and examined.  She presents for medical clearance.   Screening labs and EKG ordered and are pending.  Patient continues to deny suicidal or homicidal ideations.  Patient care transferred to Dr Ronnald Nian at the end of my shift. Patient presentation, ED course, and plan of care discussed with review of all pertinent labs and imaging. Please see his note for further details regarding further ED course and disposition.    This note was prepared using Dragon voice recognition software and may include unintentional dictation errors due to the inherent limitations of voice recognition software.   Final Clinical Impressions(s) / ED Diagnoses   Final diagnoses:  None    ED Discharge Orders    None       Cherre Robins, PA-C 06/22/19 1541    Julianne Rice, MD 06/23/19 [phone]

## 2019-06-22 NOTE — ED Triage Notes (Signed)
Pt reports that she was sent from [facility] for medical clearance for voluntary commitment for psych evaluation. Pt reports that she has really been depressed couple days due to her health problems. Denies SI at this time. :feel I am at the end of my rope".

## 2019-08-31 ENCOUNTER — Emergency Department (HOSPITAL_COMMUNITY)
Admission: EM | Admit: 2019-08-31 | Discharge: 2019-08-31 | Disposition: A | Discharge status: Home / Self Care | Payer: Medicare HMO | Attending: Emergency Medicine | Admitting: Emergency Medicine

## 2019-08-31 ENCOUNTER — Other Ambulatory Visit: Payer: Self-pay

## 2019-08-31 ENCOUNTER — Encounter (HOSPITAL_COMMUNITY): Payer: Self-pay | Admitting: Emergency Medicine

## 2019-08-31 ENCOUNTER — Emergency Department (HOSPITAL_COMMUNITY): Payer: Medicare HMO

## 2019-08-31 DIAGNOSIS — Z5321 Procedure and treatment not carried out due to patient leaving prior to being seen by health care provider: Secondary | ICD-10-CM | POA: Diagnosis not present

## 2019-08-31 DIAGNOSIS — M79604 Pain in right leg: Secondary | ICD-10-CM | POA: Diagnosis not present

## 2019-08-31 NOTE — ED Notes (Signed)
Alyssa [name] [phone] have her call son when she gets to a room

## 2019-08-31 NOTE — ED Triage Notes (Signed)
Pt with right hip pain with radiation down to the thigh. She denies recent fall, warmth, redness or swelling. hx of right hip replacement.

## 2019-09-05 ENCOUNTER — Other Ambulatory Visit: Payer: Self-pay

## 2019-09-05 ENCOUNTER — Ambulatory Visit (INDEPENDENT_AMBULATORY_CARE_PROVIDER_SITE_OTHER): Payer: Medicare HMO

## 2019-09-05 DIAGNOSIS — I6523 Occlusion and stenosis of bilateral carotid arteries: Secondary | ICD-10-CM | POA: Diagnosis not present

## 2019-09-09 ENCOUNTER — Other Ambulatory Visit: Payer: Self-pay | Admitting: Cardiology

## 2019-09-09 DIAGNOSIS — I6523 Occlusion and stenosis of bilateral carotid arteries: Secondary | ICD-10-CM

## 2019-09-10 ENCOUNTER — Other Ambulatory Visit: Payer: Medicare HMO

## 2019-09-11 ENCOUNTER — Encounter: Payer: Self-pay | Admitting: Neurology

## 2019-09-11 NOTE — Progress Notes (Signed)
Called pt no answer, left a vm

## 2019-09-17 ENCOUNTER — Emergency Department (HOSPITAL_COMMUNITY): Payer: Medicare HMO

## 2019-09-17 ENCOUNTER — Other Ambulatory Visit: Payer: Self-pay

## 2019-09-17 ENCOUNTER — Inpatient Hospital Stay (HOSPITAL_COMMUNITY)
Admission: EM | Admit: 2019-09-17 | Discharge: 2019-09-22 | DRG: 682 | Disposition: A | Discharge status: Home Health | Payer: Medicare HMO | Attending: Family Medicine | Admitting: Family Medicine

## 2019-09-17 ENCOUNTER — Encounter (HOSPITAL_COMMUNITY): Payer: Self-pay

## 2019-09-17 ENCOUNTER — Inpatient Hospital Stay (HOSPITAL_COMMUNITY): Payer: Medicare HMO

## 2019-09-17 DIAGNOSIS — R402252 Coma scale, best verbal response, oriented, at arrival to emergency department: Secondary | ICD-10-CM | POA: Diagnosis present

## 2019-09-17 DIAGNOSIS — E872 Acidosis, unspecified: Secondary | ICD-10-CM | POA: Diagnosis present

## 2019-09-17 DIAGNOSIS — G894 Chronic pain syndrome: Secondary | ICD-10-CM | POA: Diagnosis present

## 2019-09-17 DIAGNOSIS — R000 Tachycardia, unspecified: Secondary | ICD-10-CM | POA: Diagnosis not present

## 2019-09-17 DIAGNOSIS — G9340 Encephalopathy, unspecified: Secondary | ICD-10-CM | POA: Diagnosis present

## 2019-09-17 DIAGNOSIS — I959 Hypotension, unspecified: Secondary | ICD-10-CM | POA: Diagnosis present

## 2019-09-17 DIAGNOSIS — Z66 Do not resuscitate: Secondary | ICD-10-CM | POA: Diagnosis present

## 2019-09-17 DIAGNOSIS — F329 Major depressive disorder, single episode, unspecified: Secondary | ICD-10-CM | POA: Diagnosis present

## 2019-09-17 DIAGNOSIS — Z96641 Presence of right artificial hip joint: Secondary | ICD-10-CM | POA: Diagnosis present

## 2019-09-17 DIAGNOSIS — G9341 Metabolic encephalopathy: Secondary | ICD-10-CM | POA: Diagnosis present

## 2019-09-17 DIAGNOSIS — E870 Hyperosmolality and hypernatremia: Secondary | ICD-10-CM | POA: Diagnosis not present

## 2019-09-17 DIAGNOSIS — N19 Unspecified kidney failure: Secondary | ICD-10-CM | POA: Diagnosis present

## 2019-09-17 DIAGNOSIS — I10 Essential (primary) hypertension: Secondary | ICD-10-CM | POA: Diagnosis not present

## 2019-09-17 DIAGNOSIS — F0280 Dementia in other diseases classified elsewhere without behavioral disturbance: Secondary | ICD-10-CM | POA: Diagnosis not present

## 2019-09-17 DIAGNOSIS — Z7982 Long term (current) use of aspirin: Secondary | ICD-10-CM

## 2019-09-17 DIAGNOSIS — Z87891 Personal history of nicotine dependence: Secondary | ICD-10-CM

## 2019-09-17 DIAGNOSIS — N179 Acute kidney failure, unspecified: Secondary | ICD-10-CM | POA: Diagnosis present

## 2019-09-17 DIAGNOSIS — I609 Nontraumatic subarachnoid hemorrhage, unspecified: Secondary | ICD-10-CM

## 2019-09-17 DIAGNOSIS — I739 Peripheral vascular disease, unspecified: Secondary | ICD-10-CM | POA: Diagnosis present

## 2019-09-17 DIAGNOSIS — Z515 Encounter for palliative care: Secondary | ICD-10-CM | POA: Diagnosis present

## 2019-09-17 DIAGNOSIS — R296 Repeated falls: Secondary | ICD-10-CM

## 2019-09-17 DIAGNOSIS — R402142 Coma scale, eyes open, spontaneous, at arrival to emergency department: Secondary | ICD-10-CM | POA: Diagnosis present

## 2019-09-17 DIAGNOSIS — M7989 Other specified soft tissue disorders: Secondary | ICD-10-CM | POA: Diagnosis present

## 2019-09-17 DIAGNOSIS — Z20828 Contact with and (suspected) exposure to other viral communicable diseases: Secondary | ICD-10-CM | POA: Diagnosis present

## 2019-09-17 DIAGNOSIS — F0390 Unspecified dementia without behavioral disturbance: Secondary | ICD-10-CM | POA: Diagnosis present

## 2019-09-17 DIAGNOSIS — D631 Anemia in chronic kidney disease: Secondary | ICD-10-CM | POA: Diagnosis present

## 2019-09-17 DIAGNOSIS — E860 Dehydration: Secondary | ICD-10-CM | POA: Diagnosis present

## 2019-09-17 DIAGNOSIS — E785 Hyperlipidemia, unspecified: Secondary | ICD-10-CM | POA: Diagnosis present

## 2019-09-17 DIAGNOSIS — F419 Anxiety disorder, unspecified: Secondary | ICD-10-CM | POA: Diagnosis present

## 2019-09-17 DIAGNOSIS — M4316 Spondylolisthesis, lumbar region: Secondary | ICD-10-CM

## 2019-09-17 DIAGNOSIS — R4781 Slurred speech: Secondary | ICD-10-CM | POA: Diagnosis present

## 2019-09-17 DIAGNOSIS — Z7902 Long term (current) use of antithrombotics/antiplatelets: Secondary | ICD-10-CM

## 2019-09-17 DIAGNOSIS — R402362 Coma scale, best motor response, obeys commands, at arrival to emergency department: Secondary | ICD-10-CM | POA: Diagnosis present

## 2019-09-17 DIAGNOSIS — N184 Chronic kidney disease, stage 4 (severe): Secondary | ICD-10-CM | POA: Diagnosis present

## 2019-09-17 DIAGNOSIS — Z8249 Family history of ischemic heart disease and other diseases of the circulatory system: Secondary | ICD-10-CM

## 2019-09-17 DIAGNOSIS — E876 Hypokalemia: Secondary | ICD-10-CM | POA: Diagnosis not present

## 2019-09-17 DIAGNOSIS — R269 Unspecified abnormalities of gait and mobility: Secondary | ICD-10-CM

## 2019-09-17 DIAGNOSIS — I129 Hypertensive chronic kidney disease with stage 1 through stage 4 chronic kidney disease, or unspecified chronic kidney disease: Secondary | ICD-10-CM | POA: Diagnosis present

## 2019-09-17 DIAGNOSIS — M542 Cervicalgia: Secondary | ICD-10-CM | POA: Diagnosis present

## 2019-09-17 DIAGNOSIS — Z79899 Other long term (current) drug therapy: Secondary | ICD-10-CM

## 2019-09-17 LAB — CBG MONITORING, ED
Glucose-Capillary: 100 mg/dL — ABNORMAL HIGH (ref 70–99)
Glucose-Capillary: 100 mg/dL — ABNORMAL HIGH (ref 70–99)

## 2019-09-17 LAB — COMPREHENSIVE METABOLIC PANEL
ALT: 9 U/L (ref 0–44)
AST: 15 U/L (ref 15–41)
Albumin: 3.7 g/dL (ref 3.5–5.0)
Alkaline Phosphatase: 112 U/L (ref 38–126)
Anion gap: 12 (ref 5–15)
BUN: 62 mg/dL — ABNORMAL HIGH (ref 8–23)
CO2: 8 mmol/L — ABNORMAL LOW (ref 22–32)
Calcium: 9.3 mg/dL (ref 8.9–10.3)
Chloride: 124 mmol/L — ABNORMAL HIGH (ref 98–111)
Creatinine, Ser: 5.07 mg/dL — ABNORMAL HIGH (ref 0.44–1.00)
GFR calc Af Amer: 9 mL/min — ABNORMAL LOW (ref >60)
GFR calc non Af Amer: 8 mL/min — ABNORMAL LOW (ref >60)
Glucose, Bld: 102 mg/dL — ABNORMAL HIGH (ref 70–99)
Potassium: 4.3 mmol/L (ref 3.5–5.1)
Sodium: 144 mmol/L (ref 135–145)
Total Bilirubin: 0.5 mg/dL (ref 0.3–1.2)
Total Protein: 7.1 g/dL (ref 6.5–8.1)

## 2019-09-17 LAB — DIFFERENTIAL
Abs Immature Granulocytes: 0.02 10*3/uL (ref 0.00–0.07)
Basophils Absolute: 0.1 10*3/uL (ref 0.0–0.1)
Basophils Relative: 1 %
Eosinophils Absolute: 0.3 10*3/uL (ref 0.0–0.5)
Eosinophils Relative: 5 %
Immature Granulocytes: 0 %
Lymphocytes Relative: 25 %
Lymphs Abs: 1.6 10*3/uL (ref 0.7–4.0)
Monocytes Absolute: 0.9 10*3/uL (ref 0.1–1.0)
Monocytes Relative: 14 %
Neutro Abs: 3.4 10*3/uL (ref 1.7–7.7)
Neutrophils Relative %: 55 %

## 2019-09-17 LAB — RAPID URINE DRUG SCREEN, HOSP PERFORMED
Amphetamines: NOT DETECTED
Barbiturates: NOT DETECTED
Benzodiazepines: POSITIVE — AB
Cocaine: NOT DETECTED
Opiates: NOT DETECTED
Tetrahydrocannabinol: NOT DETECTED

## 2019-09-17 LAB — URINALYSIS, ROUTINE W REFLEX MICROSCOPIC
Bilirubin Urine: NEGATIVE
Glucose, UA: NEGATIVE mg/dL
Hgb urine dipstick: NEGATIVE
Ketones, ur: NEGATIVE mg/dL
Leukocytes,Ua: NEGATIVE
Nitrite: NEGATIVE
Protein, ur: 100 mg/dL — AB
Specific Gravity, Urine: 1.012 (ref 1.005–1.030)
pH: 6 (ref 5.0–8.0)

## 2019-09-17 LAB — PROTIME-INR
INR: 1.7 — ABNORMAL HIGH (ref 0.8–1.2)
Prothrombin Time: 19.5 seconds — ABNORMAL HIGH (ref 11.4–15.2)

## 2019-09-17 LAB — CBC
HCT: 29.2 % — ABNORMAL LOW (ref 36.0–46.0)
HCT: 32.5 % — ABNORMAL LOW (ref 36.0–46.0)
Hemoglobin: 9.3 g/dL — ABNORMAL LOW (ref 12.0–15.0)
Hemoglobin: 10 g/dL — ABNORMAL LOW (ref 12.0–15.0)
MCH: 28.2 pg (ref 26.0–34.0)
MCH: 28.2 pg (ref 26.0–34.0)
MCHC: 31.8 g/dL (ref 30.0–36.0)
MCHC: 30.8 g/dL (ref 30.0–36.0)
MCV: 88.5 fL (ref 80.0–100.0)
MCV: 91.8 fL (ref 80.0–100.0)
Platelets: 301 10*3/uL (ref 150–400)
Platelets: 286 10*3/uL (ref 150–400)
RBC: 3.3 MIL/uL — ABNORMAL LOW (ref 3.87–5.11)
RBC: 3.54 MIL/uL — ABNORMAL LOW (ref 3.87–5.11)
RDW: 16.8 % — ABNORMAL HIGH (ref 11.5–15.5)
RDW: 16.9 % — ABNORMAL HIGH (ref 11.5–15.5)
WBC: 6.3 10*3/uL (ref 4.0–10.5)
WBC: 7.9 10*3/uL (ref 4.0–10.5)
nRBC: 0 % (ref 0.0–0.2)
nRBC: 0 % (ref 0.0–0.2)

## 2019-09-17 LAB — ETHANOL: Alcohol, Ethyl (B): <10 mg/dL (ref <10)

## 2019-09-17 LAB — VALPROIC ACID LEVEL: Valproic Acid Lvl: 30 ug/mL — ABNORMAL LOW (ref 50.0–100.0)

## 2019-09-17 LAB — TSH: TSH: 0.803 u[IU]/mL (ref 0.350–4.500)

## 2019-09-17 LAB — SARS CORONAVIRUS 2 (TAT 6-24 HRS): SARS Coronavirus 2: NEGATIVE

## 2019-09-17 LAB — CREATININE, SERUM
Creatinine, Ser: 4.86 mg/dL — ABNORMAL HIGH (ref 0.44–1.00)
GFR calc Af Amer: 9 mL/min — ABNORMAL LOW (ref >60)
GFR calc non Af Amer: 8 mL/min — ABNORMAL LOW (ref >60)

## 2019-09-17 LAB — AMMONIA: Ammonia: 19 umol/L (ref 9–35)

## 2019-09-17 LAB — APTT: aPTT: 34 seconds (ref 24–36)

## 2019-09-17 LAB — CK: Total CK: 254 U/L — ABNORMAL HIGH (ref 38–234)

## 2019-09-17 MED ORDER — SODIUM BICARBONATE-DEXTROSE 150-5 MEQ/L-% IV SOLN
150.0000 meq | INTRAVENOUS | Status: DC
  Administered 2019-09-17 – 2019-09-18 (×3): 150 meq via INTRAVENOUS
  Filled 2019-09-17 (×2): qty 1000

## 2019-09-17 MED ORDER — ACETAMINOPHEN 325 MG PO TABS
650.0000 mg | ORAL_TABLET | Freq: Four times a day (QID) | ORAL | Status: DC | PRN
  Administered 2019-09-19 – 2019-09-21 (×3): 650 mg via ORAL
  Filled 2019-09-17 (×3): qty 2

## 2019-09-17 MED ORDER — ONDANSETRON HCL 4 MG/2ML IJ SOLN: 4.0000 mg | Freq: Four times a day (QID) | INTRAMUSCULAR | Status: DC | PRN

## 2019-09-17 MED ORDER — SODIUM BICARBONATE 8.4 % IV SOLN: INTRAVENOUS | Status: DC

## 2019-09-17 MED ORDER — LACTATED RINGERS IV BOLUS
1000.0000 mL | Freq: Once | INTRAVENOUS | Status: AC
  Administered 2019-09-17: 1000 mL via INTRAVENOUS

## 2019-09-17 MED ORDER — HEPARIN SODIUM (PORCINE) 5000 UNIT/ML IJ SOLN
5000.0000 [IU] | Freq: Three times a day (TID) | INTRAMUSCULAR | Status: DC
  Administered 2019-09-17: 5000 [IU] via SUBCUTANEOUS
  Filled 2019-09-17: qty 1

## 2019-09-17 MED ORDER — ONDANSETRON HCL 4 MG PO TABS: 4.0000 mg | ORAL_TABLET | Freq: Four times a day (QID) | ORAL | Status: DC | PRN

## 2019-09-17 MED ORDER — ACETAMINOPHEN 650 MG RE SUPP: 650.0000 mg | Freq: Four times a day (QID) | RECTAL | Status: DC | PRN

## 2019-09-17 NOTE — Discharge Planning (Signed)
EDCM to follow for disposition needs.  

## 2019-09-17 NOTE — ED Notes (Signed)
Patient transported to CT 

## 2019-09-17 NOTE — ED Notes (Signed)
Please call son Reyne Falconi [phone] for a status update--Marai Teehan

## 2019-09-17 NOTE — ED Notes (Signed)
Patient transported to [facility] 

## 2019-09-17 NOTE — ED Provider Notes (Addendum)
[facility] EMERGENCY DEPARTMENT Provider Note   CSN: [id number] Arrival date & time: 09/17/19  1306     History   Chief Complaint Chief Complaint  Patient presents with  . Altered Mental Status    HPI Alyssa [name] is a 78 y.o. female.     HPI Patient was brought to the emergency room for evaluation of altered mental status.  According to EMS report family states the patient has had slurred speech and has been confused since Thursday.  No reports of fever.  No reports of vomiting or diarrhea.  Patient is awake and will answer my questions but she is difficult to understand.  She complains of pain in her fingers to me but not specifically headache.  She denies any difficulty with breathing.  No known fevers. Past Medical History:  Diagnosis Date  . Anxiety   . Arthritis   . Chest pain   . Chronic kidney disease   . Elevated troponin 12/13/2018  . Headache(784.0)   . Hyperlipidemia   . Hypertension   . Joint pain   . Leg pain   . Peripheral neuropathy   . Peripheral vascular disease [facility])     Patient Active Problem List   Diagnosis Date Noted  . Abnormal CT scan, colon   . Diarrhea   . Diverticulitis 06/07/2019  . Depression with anxiety 06/07/2019  . DNR (do not resuscitate) 03/23/2019  . Volume overload 03/23/2019  . Edema 03/23/2019  . Acute renal failure (ARF) ([place]) 03/08/2019  . Hypokalemia 03/08/2019  . Palliative care by specialist   . Goals of care, counseling/discussion   . Altered mental status   . Encephalopathy, toxic 12/24/2018  . Aspiration pneumonia of both lower lobes due to gastric secretions ([place]) 12/24/2018  . Non-ST elevation (NSTEMI) myocardial infarction ([place])   . Chest pain   . Dyspnea on exertion   . Elevated troponin level 12/09/2018  . Chronic migraine 03/27/2018  . Lumbar radiculopathy 03/27/2018  . Gait abnormality 03/27/2018  . CKD (chronic kidney disease) stage 4, GFR 15-29 ml/min (HCC) 12/28/2016   . Symptomatic anemia 12/28/2016  . Chronic pain syndrome 12/28/2016  . 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 Chronic right shoulder pain 12/06/2016  . Fall 09/17/2016  . Fracture of humeral head, closed, right, initial encounter 09/17/2016  . Rib fractures 09/17/2016  . Multiple falls 09/17/2016  . Severe muscle deconditioning 09/17/2016  . Failure to thrive in adult 09/17/2016  . Melena 04/25/2016  . Essential hypertension 04/25/2016  . Anxiety state 04/25/2016  . Tobacco abuse 04/25/2016  . Hyperlipidemia 04/25/2016  . Syncope 04/24/2016  . Syncope and collapse 04/24/2016  . Gait difficulty 01/12/2016  . Foot drop, left 01/12/2016  . Severe recurrent major depression without psychotic features ([place]) 08/28/2015  . Spondylolisthesis at L4-L5 level 07/30/2015  . PVD (peripheral vascular disease) with claudication ([place]) 07/29/2014  . Weakness-Bilateral arm/leg 07/29/2014  . Numbness-left leg 07/29/2014  . Swelling of limb-Legs 07/29/2014  . Atherosclerosis of native arteries of the extremities with intermittent claudication 09/30/2011    Past Surgical History:  Procedure Laterality Date  . ABDOMINAL ANGIOGRAM N/A 10/29/2011   Procedure: ABDOMINAL ANGIOGRAM;  Surgeon: Elam Dutch, MD;  Location[facility] C[facility] LAB;  Service: Cardiovascular;  Laterality: N/A;  . BACK SURGERY    . FEMORAL-FEMORAL BYPASS GRAFT  08/31/2010  . FLEXIBLE SIGMOIDOSCOPY N/A 06/12/2019   Procedure: FLEXIBLE SIGMOIDOSCOPY;  Surgeon: Ladene Artist, MD;  Location: [facility] ENDOSCOPY;  Service: Endoscopy;  Laterality: N/A;  Patient had little bit to eat this morning so this will be unsedated.  Marland Kitchen JOINT REPLACEMENT Right 2012   Hip  . LOWER EXTREMITY ANGIOGRAPHY  06/14/2017   Procedure: Lower Extremity Angiography;  Surgeon: Adrian Prows, MD;  Location: [place] CV LAB;  Service: Cardiovascular;;  Bilateral limited angio performed  . RENAL ANGIOGRAPHY N/A 06/14/2017   Procedure: Renal  Angiography;  Surgeon: Adrian Prows, MD;  Location: [place] CV LAB;  Service: Cardiovascular;  Laterality: N/A;  . TOTAL HIP ARTHROPLASTY     right     OB History   No obstetric history on file.      Home Medications    Prior to Admission medications   Medication Sig Start Date End Date Taking? Authorizing Provider  acetaminophen (TYLENOL) 325 MG tablet Take 2 tablets (650 mg total) by mouth every 6 (six) hours as needed for mild pain, moderate pain or fever (or Fever >/= 101). 06/16/19   Hongalgi, Lenis Dickinson, MD  aspirin 81 MG EC tablet Take 1 tablet (81 mg total) by mouth daily. 01/09/19   Caren Griffins, MD  busPIRone (BUSPAR) 15 MG tablet Take 15 mg by mouth 3 (three) times daily as needed for anxiety. 02/28/19   [provider]  citalopram (CELEXA) 20 MG tablet Take 1 tablet (20 mg total) by mouth daily. 03/23/19 03/22/20  Shelly Coss, MD  clonazePAM (KLONOPIN) 1 MG tablet Take 0.5 tablets (0.5 mg total) by mouth at bedtime. 06/16/19   Hongalgi, Lenis Dickinson, MD  clopidogrel (PLAVIX) 75 MG tablet Take 1 tablet (75 mg total) by mouth daily. 01/09/19   Caren Griffins, MD  donepezil (ARICEPT) 5 MG tablet Take 5 mg by mouth daily.    [provider]  ergocalciferol (VITAMIN D2) 50000 units capsule Take 50,000 Units by mouth every Monday.     [provider]  feeding supplement, ENSURE ENLIVE, (ENSURE ENLIVE) LIQD Take 237 mLs by mouth 2 (two) times daily between meals. 06/16/19   Hongalgi, Lenis Dickinson, MD  Ferrous Sulfate (IRON HIGH-POTENCY) 325 MG TABS Take 325 mg by mouth daily with breakfast. 06/30/19   Hongalgi, Lenis Dickinson, MD  gabapentin (NEURONTIN) 100 MG capsule Take 1 capsule (100 mg total) by mouth 3 (three) times daily. 03/12/19   Pokhrel, Laxman, MD  isosorbide-hydrALAZINE (BIDIL) 20-37.5 MG tablet Take 2 tablets by mouth 3 (three) times daily.    [provider]  magnesium oxide (MAG-OX) 400 (241.3 Mg) MG tablet Take 1 tablet (400 mg total) by mouth 2  (two) times daily. Patient not taking: Reported on 06/22/2019 03/12/19   Flora Lipps, MD  megestrol (MEGACE) 40 MG tablet Take 40 mg by mouth 2 (two) times daily.    [provider]  metoprolol tartrate (LOPRESSOR) 25 MG tablet Take 2 tablets (50 mg total) by mouth 2 (two) times daily. 06/16/19   Hongalgi, Lenis Dickinson, MD  Multiple Vitamin (MULTIVITAMIN WITH MINERALS) TABS tablet Take 1 tablet by mouth daily. 06/17/19   Hongalgi, Lenis Dickinson, MD  pantoprazole (PROTONIX) 40 MG tablet Take 1 tablet (40 mg total) by mouth daily. 06/16/19   Hongalgi, Lenis Dickinson, MD  rosuvastatin (CRESTOR) 40 MG tablet Take 1 tablet (40 mg total) by mouth at bedtime. 06/16/19   Hongalgi, Lenis Dickinson, MD  valproic acid (DEPAKENE) 250 MG capsule Take 1 capsule (250 mg total) by mouth 2 (two) times daily. 01/09/19   Caren Griffins, MD    Family History Family History  Problem  Relation Age of Onset  . Heart attack Mother   . Heart disease Mother   . Hyperlipidemia Mother   . Hypertension Mother   . Heart attack Father   . Heart disease Father        Before age 63  . Hyperlipidemia Father   . Hypertension Father   . Cancer Sister        brain tumor  . Aneurysm Brother        brain  . Colon cancer Neg Hx   . Liver cancer Neg Hx     Social History Social History   Tobacco Use  . Smoking status: Former Smoker    Packs/day: 1.00    Years: 27.00    Pack years: 27.00    Types: Cigarettes    Quit date: 11/22/2018    Years since quitting: 0.8  . Smokeless tobacco: Never Used  Substance Use Topics  . Alcohol use: No  . Drug use: No     Allergies   Patient has no known allergies.   Review of Systems Review of Systems  All other systems reviewed and are negative.    Physical Exam Updated Vital Signs BP (!) 179/96 (BP Location: Right Arm)   Pulse 83   Temp 98.2 F (36.8 C) (Oral)   Resp 18   SpO2 99%   Physical Exam Vitals signs and nursing note reviewed.  Constitutional:      Appearance: She  is well-developed. She is not toxic-appearing or diaphoretic.     Comments: Elderly, frail  HENT:     Head: Normocephalic and atraumatic.     Right Ear: External ear normal.     Left Ear: External ear normal.  Eyes:     General: No scleral icterus.       Right eye: No discharge.        Left eye: No discharge.     Conjunctiva/sclera: Conjunctivae normal.  Neck:     Musculoskeletal: Neck supple.     Trachea: No tracheal deviation.  Cardiovascular:     Rate and Rhythm: Normal rate and regular rhythm.  Pulmonary:     Effort: Pulmonary effort is normal. No respiratory distress.     Breath sounds: Normal breath sounds. No stridor. No wheezing or rales.  Abdominal:     General: Bowel sounds are normal. There is no distension.     Palpations: Abdomen is soft.     Tenderness: There is no abdominal tenderness. There is no guarding or rebound.  Musculoskeletal:        General: No tenderness.  Skin:    General: Skin is warm.     Findings: No rash.  Neurological:     Mental Status: She is alert.     GCS: GCS eye subscore is 4. GCS verbal subscore is 4. GCS motor subscore is 6.     Cranial Nerves: No cranial nerve deficit (no facial droop, extraocular movements intact,  slurred speech ).     Sensory: No sensory deficit.     Motor: Weakness present. No abnormal muscle tone or seizure activity.     Comments: Speech is garbled, patient is able to move her extremities but appears weaker on the right side difficulty holding her right arm off the bed, difficulty moving either leg off the bed      ED Treatments / Results  Labs (all labs ordered are listed, but only abnormal results are displayed) Labs Reviewed  PROTIME-INR - Abnormal; Notable for the following  components:      Result Value   Prothrombin Time 19.5 (*)    INR 1.7 (*)    All other components within normal limits  CBC - Abnormal; Notable for the following components:   RBC 3.30 (*)    Hemoglobin 9.3 (*)    HCT 29.2 (*)     RDW 16.8 (*)    All other components within normal limits  COMPREHENSIVE METABOLIC PANEL - Abnormal; Notable for the following components:   Chloride 124 (*)    CO2 8 (*)    Glucose, Bld 102 (*)    BUN 62 (*)    Creatinine, Ser 5.07 (*)    GFR calc non Af Amer 8 (*)    GFR calc Af Amer 9 (*)    All other components within normal limits  CBG MONITORING, ED - Abnormal; Notable for the following components:   Glucose-Capillary 100 (*)    All other components within normal limits  SARS CORONAVIRUS 2 (TAT 6-24 HRS)  ETHANOL  APTT  DIFFERENTIAL  RAPID URINE DRUG SCREEN, HOSP PERFORMED  URINALYSIS, ROUTINE W REFLEX MICROSCOPIC    EKG EKG Interpretation  Date/Time:  Monday September 17 2019 13:07:36 EST Ventricular Rate:  83 PR Interval:    QRS Duration: 83 QT Interval:  420 QTC Calculation: 494 R Axis:   70 Text Interpretation: Sinus rhythm Probable LVH with secondary repol abnrm Borderline prolonged QT interval No significant change since last tracing Confirmed by Dorie Rank [phone]) on 09/17/2019 1:14:54 PM   Radiology Ct Head Wo Contrast  Result Date: 09/17/2019 CLINICAL DATA:  Focal neuro deficit. Slurred speech and altered mental status since Thursday. EXAM: CT HEAD WITHOUT CONTRAST TECHNIQUE: Contiguous axial images were obtained from the base of the skull through the vertex without intravenous contrast. COMPARISON:  12/05/2017 MRI.  CT head dated 06/23/2017. FINDINGS: Brain: No evidence of acute infarction, hemorrhage, hydrocephalus, extra-axial collection or mass lesion/mass effect. Mild age related atrophy and chronic microvascular ischemic changes are noted bilaterally. Vascular: No hyperdense vessel or unexpected calcification. Skull: Normal. Negative for fracture or focal lesion. 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 Sinuses/Orbits: No acute finding. Other: None. IMPRESSION: No acute intracranial abnormality. Electronically Signed   By: Constance Holster  M.D.   On: 09/17/2019 15:52   Dg Chest Portable 1 View  Result Date: 09/17/2019 CLINICAL DATA:  Altered mental status, slurred speech EXAM: PORTABLE CHEST 1 VIEW COMPARISON:  03/22/2019 chest radiograph. FINDINGS: Stable cardiomediastinal silhouette with normal heart size. No pneumothorax. No pleural effusion. Lungs appear clear, with no acute consolidative airspace disease and no pulmonary edema. IMPRESSION: No active disease. Electronically Signed   By: Ilona Sorrel M.D.   On: 09/17/2019 14:27    Procedures .Critical Care Performed by: Dorie Rank, MD Authorized by: Dorie Rank, MD   Critical care provider statement:    Critical care time (minutes):  35   Critical care was time spent personally by me on the following activities:  Discussions with consultants, evaluation of patient's response to treatment, examination of patient, ordering and performing treatments and interventions, ordering and review of laboratory studies, ordering and review of radiographic studies, pulse oximetry, re-evaluation of patient's condition, obtaining history from patient or surrogate and review of old charts   (including critical care time)  Medications Ordered in ED Medications  lactated ringers bolus 1,000 mL (has no administration in time range)     Initial Impression / Assessment and Plan / ED Course  I  have reviewed the triage vital signs and the nursing notes.  Pertinent labs & imaging results that were available during my care of the patient were reviewed by me and considered in my medical decision making (see chart for details).  Clinical Course as of Sep 16 1601  [facility] Sep 17, 2019  1527 Labs notable for significant increase in BUN and creatinine consistent with an acute kidney injury.  This is an acute change from 2 months ago   [JK]  1528 CBC shows stable anemia.   [JK]    Clinical Course User Index [JK] Dorie Rank, MD  Patient's mental status changes most likely related to her AKI.   Etiology unclear.  Urinalysis is pending.  Renal ultrasound ordered.  CT scan of her head is also pending.  Patient will require admission to the hospital for further treatment.    CT scan without acute findings.  Will admit for further workup.  Final Clinical Impressions(s) / ED Diagnoses   Final diagnoses:  AKI (acute kidney injury) ([place])  Uremia      Dorie Rank, MD 09/17/19 8875    Dorie Rank, MD 09/17/19 [id number]

## 2019-09-17 NOTE — ED Notes (Signed)
ED TO INPATIENT HANDOFF REPORT  ED Nurse Name and Phone #: William Hamburger, [place]  S Name/Age/Gender Alyssa [name] 78 y.o. female Room/Bed: 017C/017C  Code Status   Code Status: Prior  Home/SNF/Other Home Patient oriented to: self Is this baseline? No      Chief Complaint sick  Triage Note Pt brought to ED via EMS from home. Pts family reports pt has had altered mental status and slurred speech since Thursday. Pt family reports pt is slightly confused at baseline with a hx of MI, HTN. Family reports pt had white discharge in urine this AM. Pt currently A&O x1.    Allergies No Known Allergies  Level of Care/Admitting Diagnosis ED Disposition    None      B Medical/Surgery History Past Medical History:  Diagnosis Date  . Anxiety   . Arthritis   . Chest pain   . Chronic kidney disease   . Elevated troponin 12/13/2018  . Headache(784.0)   . Hyperlipidemia   . Hypertension   . Joint pain   . Leg pain   . Peripheral neuropathy   . Peripheral vascular disease [facility])    Past Surgical History:  Procedure Laterality Date  . ABDOMINAL ANGIOGRAM N/A 10/29/2011   Procedure: ABDOMINAL ANGIOGRAM;  Surgeon: Elam Dutch, MD;  Location: [facility] CATH LAB;  Service: Cardiovascular;  Laterality: N/A;  . BACK SURGERY    . FEMORAL-FEMORAL BYPASS GRAFT  08/31/2010  . FLEXIBLE SIGMOIDOSCOPY N/A 06/12/2019   Procedure: FLEXIBLE SIGMOIDOSCOPY;  Surgeon: Ladene Artist, MD;  Location: [facility] ENDOSCOPY;  Service: Endoscopy;  Laterality: N/A;  Patient had little bit to eat this morning so this will be unsedated.  Marland Kitchen JOINT REPLACEMENT Right 2012   Hip  . LOWER EXTREMITY ANGIOGRAPHY  06/14/2017   Procedure: Lower Extremity Angiography;  Surgeon: Adrian Prows, MD;  Location: [place] CV LAB;  Service: Cardiovascular;;  Bilateral limited angio performed  . RENAL ANGIOGRAPHY N/A 06/14/2017   Procedure: Renal Angiography;  Surgeon: Adrian Prows, MD;  Location: [place] CV LAB;  Service:  Cardiovascular;  Laterality: N/A;  . TOTAL HIP ARTHROPLASTY     right     A IV Location/Drains/Wounds Patient Lines/Drains/Airways Status   Active Line/Drains/Airways    Name:   Placement date:   Placement time:   Site:   Days:   Peripheral IV 09/17/19 Right Wrist   09/17/19    1608    Wrist   less than 1   External Urinary Catheter   09/17/19    1345    -   less than 1          Intake/Output Last 24 hours  Intake/Output Summary (Last 24 hours) at 09/17/2019 1849 Last data filed at 09/17/2019 1610 Gross per 24 hour  Intake -  Output 650 ml  Net -650 ml    Labs/Imaging Results for orders placed or performed during the hospital encounter of 09/17/19 (from the past 48 hour(s))  CBG monitoring, ED     Status: Abnormal   Collection Time: 09/17/19  1:18 PM  Result Value Ref Range   Glucose-Capillary 100 (H) 70 - 99 mg/dL   Comment 1 Notify RN    Comment 2 Document in Chart   Ethanol     Status: None   Collection Time: 09/17/19  2:08 PM  Result Value Ref Range   Alcohol, Ethyl (B) <10 <10 mg/dL    Comment: (NOTE) Lowest detectable limit for serum alcohol is 10 mg/dL. For  medical purposes only. Performed at [facility], [place] [address], [place], [place] [address]   Protime-INR     Status: Abnormal   Collection Time: 09/17/19  2:08 PM  Result Value Ref Range   Prothrombin Time 19.5 (H) 11.4 - 15.2 seconds   INR 1.7 (H) 0.8 - 1.2    Comment: (NOTE) INR goal varies based on device and disease states. Performed at [facility], [place] [address] [place], [place] [address]   APTT     Status: None   Collection Time: 09/17/19  2:08 PM  Result Value Ref Range   aPTT 34 24 - 36 seconds    Comment: Performed at [facility] [address] [place], [place] [address]  CBC     Status: Abnormal   Collection Time: 09/17/19  2:08 PM  Result Value Ref Range   WBC 6.3 4.0 - 10.5 K/uL   RBC 3.30 (L) 3.87 - 5.11 MIL/uL   Hemoglobin 9.3 (L) 12.0 - 15.0 g/dL    HCT 29.2 (L) 36.0 - 46.0 %   MCV 88.5 80.0 - 100.0 fL   MCH 28.2 26.0 - 34.0 pg   MCHC 31.8 30.0 - 36.0 g/dL   RDW 16.8 (H) 11.5 - 15.5 %   Platelets 301 150 - 400 K/uL   nRBC 0.0 0.0 - 0.2 %    Comment: Performed at [facility] [address], [place], [place] [address]  Differential     Status: None   Collection Time: 09/17/19  2:08 PM  Result Value Ref Range   Neutrophils Relative % 55 %   Neutro Abs 3.4 1.7 - 7.7 K/uL   Lymphocytes Relative 25 %   Lymphs Abs 1.6 0.7 - 4.0 K/uL   Monocytes Relative 14 %   Monocytes Absolute 0.9 0.1 - 1.0 K/uL   Eosinophils Relative 5 %   Eosinophils Absolute 0.3 0.0 - 0.5 K/uL   Basophils Relative 1 %   Basophils Absolute 0.1 0.0 - 0.1 K/uL   Immature Granulocytes 0 %   Abs Immature Granulocytes 0.02 0.00 - 0.07 K/uL    Comment: Performed at [facility], [place] [address], [place], [place] [address]  Comprehensive metabolic panel     Status: Abnormal   Collection Time: 09/17/19  2:08 PM  Result Value Ref Range   Sodium 144 135 - 145 mmol/L   Potassium 4.3 3.5 - 5.1 mmol/L   Chloride 124 (H) 98 - 111 mmol/L   CO2 8 (L) 22 - 32 mmol/L   Glucose, Bld 102 (H) 70 - 99 mg/dL   BUN 62 (H) 8 - 23 mg/dL   Creatinine, Ser 5.07 (H) 0.44 - 1.00 mg/dL   Calcium 9.3 8.9 - 10.3 mg/dL   Total Protein 7.1 6.5 - 8.1 g/dL   Albumin 3.7 3.5 - 5.0 g/dL   AST 15 15 - 41 U/L   ALT 9 0 - 44 U/L   Alkaline Phosphatase 112 38 - 126 U/L   Total Bilirubin 0.5 0.3 - 1.2 mg/dL   GFR calc non Af Amer 8 (L) >60 mL/min   GFR calc Af Amer 9 (L) >60 mL/min   Anion gap 12 5 - 15    Comment: Performed at [facility], [place] [address], [place], [place] [address]  Urine rapid drug screen (hosp performed)not at [facility]     Status: Abnormal   Collection Time: 09/17/19  3:56 PM  Result Value  Ref Range   Opiates NONE DETECTED NONE DETECTED   Cocaine NONE DETECTED NONE DETECTED   Benzodiazepines POSITIVE (A) NONE DETECTED   Amphetamines NONE DETECTED NONE  DETECTED   Tetrahydrocannabinol NONE DETECTED NONE DETECTED   Barbiturates NONE DETECTED NONE DETECTED    Comment: (NOTE) DRUG SCREEN FOR MEDICAL PURPOSES ONLY.  IF CONFIRMATION IS NEEDED FOR ANY PURPOSE, NOTIFY LAB WITHIN 5 DAYS. LOWEST DETECTABLE LIMITS FOR URINE DRUG SCREEN Drug Class                     Cutoff (ng/mL) Amphetamine and metabolites    1000 Barbiturate and metabolites    200 Benzodiazepine                 626 Tricyclics and metabolites     300 Opiates and metabolites        300 Cocaine and metabolites        300 THC                            50 Performed at [facility], [place] [address] [place], [place] [address]   Urinalysis, Routine w reflex microscopic (not at [facility])     Status: Abnormal   Collection Time: 09/17/19  3:56 PM  Result Value Ref Range   Color, Urine YELLOW YELLOW   APPearance CLEAR CLEAR   Specific Gravity, Urine 1.012 1.005 - 1.030   pH 6.0 5.0 - 8.0   Glucose, UA NEGATIVE NEGATIVE mg/dL   Hgb urine dipstick NEGATIVE NEGATIVE   Bilirubin Urine NEGATIVE NEGATIVE   Ketones, ur NEGATIVE NEGATIVE mg/dL   Protein, ur 100 (A) NEGATIVE mg/dL   Nitrite NEGATIVE NEGATIVE   Leukocytes,Ua NEGATIVE NEGATIVE   RBC / HPF 0-5 0 - 5 RBC/hpf   WBC, UA 0-5 0 - 5 WBC/hpf   Bacteria, UA RARE (A) NONE SEEN   Mucus PRESENT    Amorphous Crystal PRESENT     Comment: Performed at [facility] [address], [place], [place] [address]   Ct Head Wo Contrast  Result Date: 09/17/2019 CLINICAL DATA:  Focal neuro deficit. Slurred speech and altered mental status since Thursday. EXAM: CT HEAD WITHOUT CONTRAST TECHNIQUE: Contiguous axial images were obtained from the base of the skull through the vertex without intravenous contrast. COMPARISON:  12/05/2017 MRI.  CT head dated 06/23/2017. FINDINGS: Brain: No evidence of acute infarction, hemorrhage, hydrocephalus, extra-axial collection or mass lesion/mass effect. Mild age related atrophy and chronic  microvascular ischemic changes are noted bilaterally. Vascular: No hyperdense vessel or unexpected calcification. Skull: Normal. Negative for fracture or focal lesion. 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 Sinuses/Orbits: No acute finding. Other: None. IMPRESSION: No acute intracranial abnormality. Electronically Signed   By: Constance Holster M.D.   On: 09/17/2019 15:52   Dg Chest Portable 1 View  Result Date: 09/17/2019 CLINICAL DATA:  Altered mental status, slurred speech EXAM: PORTABLE CHEST 1 VIEW COMPARISON:  03/22/2019 chest radiograph. FINDINGS: Stable cardiomediastinal silhouette with normal heart size. No pneumothorax. No pleural effusion. Lungs appear clear, with no acute consolidative airspace disease and no pulmonary edema. IMPRESSION: No active disease. Electronically Signed   By: Ilona Sorrel M.D.   On: 09/17/2019 14:27    Pending Labs Unresulted Labs (From admission, onward)    Start     Ordered   09/17/19 1602  SARS CORONAVIRUS 2 (TAT 6-24 HRS) Nasopharyngeal Nasopharyngeal Swab  (  Asymptomatic/Tier 3)  Once,   STAT    Question Answer Comment  Is this test for diagnosis or screening Screening   Symptomatic for COVID-19 as defined by CDC No   Hospitalized for COVID-19 No   Admitted to ICU for COVID-19 No   Previously tested for COVID-19 Yes   Resident in a congregate (group) care setting No   Employed in healthcare setting No   Pregnant No      09/17/19 1601          Vitals/Pain Today's Vitals   09/17/19 1315 09/17/19 1318 09/17/19 1526 09/17/19 1545  BP: (!) 178/109  (!) 179/96 (!) 182/94  Pulse: (!) 53  83 82  Resp: 19  18 19   Temp:      TempSrc:      SpO2: (!) 58%  99% 98%  PainSc:  0-No pain 0-No pain     Isolation Precautions No active isolations  Medications Medications  lactated ringers bolus 1,000 mL (1,000 mLs Intravenous New Bag/Given 09/17/19 1609)    Mobility walks with person assist High fall risk   Focused  Assessments Neuro Assessment Handoff:  Swallow screen pass? No  Cardiac Rhythm: Normal sinus rhythm   Last date known well: 09/13/19   Neuro Assessment:   Neuro Checks:      Last Documented NIHSS Modified Score:   Has TPA been given? No If patient is a Neuro Trauma and patient is going to OR before floor call report to [facility] nurse: [phone] or [phone]  , Renal Assessment Handoff:  Hemodialysis Schedule:  Last Hemodialysis date and time:    Restricted appendage:       R Recommendations: See Admitting Provider Note  Report given to:   Additional Notes: AKI;

## 2019-09-17 NOTE — ED Provider Notes (Signed)
4:42 PM Care assumed from Dr. Tomi Bamberger.  At time of transfer care, patient is awaiting admission for acute kidney injury and altered mental status with fatigue and some speech abnormalities.  Patient was initially waiting in CT head to be completed which did not show acute abnormality.  Patient will be called for admission for rehydration and further management.  Dr. Tomi Bamberger ordered ultrasound of the kidneys to further evaluate the AKI.  He did not order MRI initially, will defer to admitting team for the speech abnormality.   Tegeler, Gwenyth Allegra, MD 09/18/19 1057

## 2019-09-17 NOTE — ED Notes (Signed)
Pt's CBG result was 100. Informed April - RN.

## 2019-09-17 NOTE — H&P (Addendum)
History and Physical    Alyssa [name] DOB: 02-13-1941 DOA: 09/17/2019  PCP: Loretha Brasil, FNP  Patient coming from: Home.  Chief Complaint: Confusion.  HPI: Alyssa [name] is a 78 y.o. female with history of hypertension, chronic kidney disease stage IV baseline creatinine around 2, peripheral vascular disease, anemia was brought into the ER after patient has been having increasing confusion since this morning.  Patient's daughter who provided most of the history states that patient has been not eating well for last 1 week and has been having poor appetite but no nausea vomiting or diarrhea.  Has had a fall recently.  Did not lose consciousness.  Since this morning patient has become more confused.  Patient was brought to the ER.  Per family patient also was found to have slurred speech.  ED Course: In the ER patient was very confused CT head is unremarkable initially.  Patient was afebrile.  Does not follow commands.  Labs show creatinine is increased from baseline of 1.8 in September and presently is 5 with bicarb of 8 WBC 6.3 platelets 301.  Patient is started on bicarb drip admitted for acute encephalopathy with acute renal failure with non-anion gap metabolic acidosis.  Urine is unremarkable.  On exam patient is confused moving all extremities.  Pupils are equal and reactive to light.  Review of Systems: As per HPI, rest all negative.   Past Medical History:  Diagnosis Date  . Anxiety   . Arthritis   . Chest pain   . Chronic kidney disease   . Elevated troponin 12/13/2018  . Headache(784.0)   . Hyperlipidemia   . Hypertension   . Joint pain   . Leg pain   . Peripheral neuropathy   . Peripheral vascular disease [facility])     Past Surgical History:  Procedure Laterality Date  . ABDOMINAL ANGIOGRAM N/A 10/29/2011   Procedure: ABDOMINAL ANGIOGRAM;  Surgeon: Elam Dutch, MD;  Location: [facility] CATH LAB;  Service: Cardiovascular;  Laterality: N/A;  .  BACK SURGERY    . FEMORAL-FEMORAL BYPASS GRAFT  08/31/2010  . FLEXIBLE SIGMOIDOSCOPY N/A 06/12/2019   Procedure: FLEXIBLE SIGMOIDOSCOPY;  Surgeon: Ladene Artist, MD;  Location: [facility] ENDOSCOPY;  Service: Endoscopy;  Laterality: N/A;  Patient had little bit to eat this morning so this will be unsedated.  Marland Kitchen JOINT REPLACEMENT Right 2012   Hip  . LOWER EXTREMITY ANGIOGRAPHY  06/14/2017   Procedure: Lower Extremity Angiography;  Surgeon: Adrian Prows, MD;  Location: [place] CV LAB;  Service: Cardiovascular;;  Bilateral limited angio performed  . RENAL ANGIOGRAPHY N/A 06/14/2017   Procedure: Renal Angiography;  Surgeon: Adrian Prows, MD;  Location: [place] CV LAB;  Service: Cardiovascular;  Laterality: N/A;  . TOTAL HIP ARTHROPLASTY     right     reports that she quit smoking about 9 months ago. Her smoking use included cigarettes. She has a 27.00 pack-year smoking history. She has never used smokeless tobacco. She reports that she does not drink alcohol or use drugs.  No Known Allergies  Family History  Problem Relation Age of Onset  . Heart attack Mother   . Heart disease Mother   . Hyperlipidemia Mother   . Hypertension Mother   . Heart attack Father   . Heart disease Father        Before age 28  . Hyperlipidemia Father   . Hypertension Father   . Cancer Sister        brain  tumor  . Aneurysm Brother        brain  . Colon cancer Neg Hx   . Liver cancer Neg Hx     Prior to Admission medications   Medication Sig Start Date End Date Taking? Authorizing Provider  acetaminophen (TYLENOL) 325 MG tablet Take 2 tablets (650 mg total) by mouth every 6 (six) hours as needed for mild pain, moderate pain or fever (or Fever >/= 101). 06/16/19   Hongalgi, Lenis Dickinson, MD  aspirin 81 MG EC tablet Take 1 tablet (81 mg total) by mouth daily. 01/09/19   Caren Griffins, MD  busPIRone (BUSPAR) 15 MG tablet Take 15 mg by mouth 3 (three) times daily as needed for anxiety. 02/28/19   [provider]  citalopram (CELEXA) 20 MG tablet Take 1 tablet (20 mg total) by mouth daily. 03/23/19 03/22/20  Shelly Coss, MD  clonazePAM (KLONOPIN) 1 MG tablet Take 0.5 tablets (0.5 mg total) by mouth at bedtime. 06/16/19   Hongalgi, Lenis Dickinson, MD  clopidogrel (PLAVIX) 75 MG tablet Take 1 tablet (75 mg total) by mouth daily. 01/09/19   Caren Griffins, MD  donepezil (ARICEPT) 5 MG tablet Take 5 mg by mouth daily.    [provider]  ergocalciferol (VITAMIN D2) 50000 units capsule Take 50,000 Units by mouth every Monday.     [provider]  feeding supplement, ENSURE ENLIVE, (ENSURE ENLIVE) LIQD Take 237 mLs by mouth 2 (two) times daily between meals. 06/16/19   Hongalgi, Lenis Dickinson, MD  Ferrous Sulfate (IRON HIGH-POTENCY) 325 MG TABS Take 325 mg by mouth daily with breakfast. 06/30/19   Hongalgi, Lenis Dickinson, MD  gabapentin (NEURONTIN) 100 MG capsule Take 1 capsule (100 mg total) by mouth 3 (three) times daily. 03/12/19   Pokhrel, Laxman, MD  isosorbide-hydrALAZINE (BIDIL) 20-37.5 MG tablet Take 2 tablets by mouth 3 (three) times daily.    [provider]  magnesium oxide (MAG-OX) 400 (241.3 Mg) MG tablet Take 1 tablet (400 mg total) by mouth 2 (two) times daily. Patient not taking: Reported on 06/22/2019 03/12/19   Flora Lipps, MD  megestrol (MEGACE) 40 MG tablet Take 40 mg by mouth 2 (two) times daily.    [provider]  metoprolol tartrate (LOPRESSOR) 25 MG tablet Take 2 tablets (50 mg total) by mouth 2 (two) times daily. 06/16/19   Hongalgi, Lenis Dickinson, MD  Multiple Vitamin (MULTIVITAMIN WITH MINERALS) TABS tablet Take 1 tablet by mouth daily. 06/17/19   Hongalgi, Lenis Dickinson, MD  pantoprazole (PROTONIX) 40 MG tablet Take 1 tablet (40 mg total) by mouth daily. 06/16/19   Hongalgi, Lenis Dickinson, MD  rosuvastatin (CRESTOR) 40 MG tablet Take 1 tablet (40 mg total) by mouth at bedtime. 06/16/19   Hongalgi, Lenis Dickinson, MD  valproic acid (DEPAKENE) 250 MG capsule Take 1 capsule (250 mg total) by mouth  2 (two) times daily. 01/09/19   Caren Griffins, MD    Physical Exam: Constitutional: Moderately built and poorly nourished. Vitals:   09/17/19 2015 09/17/19 2030 09/17/19 2100 09/17/19 2115  BP:  (!) 166/87 (!) 176/93   Pulse:  91  84  Resp: 17 16 15 15   Temp:      TempSrc:      SpO2:  100%  100%   Eyes: Anicteric no pallor. ENMT: No discharge from the ears eyes nose or mouth. Neck: No mass.  No neck rigidity. Respiratory: No rhonchi or crepitations. Cardiovascular: S1-S2 heard. Abdomen: Soft nontender bowel sounds present.  Musculoskeletal: No edema. Skin: No rash. Neurologic: Patient is oriented to her name only.  Moves all all extremities. Psychiatric: Appears confused and oriented to name only.   Labs on Admission: I have personally reviewed following labs and imaging studies  CBC: Recent Labs  Lab 09/17/19 1408  WBC 6.3  NEUTROABS 3.4  HGB 9.3*  HCT 29.2*  MCV 88.5  PLT 161   Basic Metabolic Panel: Recent Labs  Lab 09/17/19 1408  NA 144  K 4.3  CL 124*  CO2 8*  GLUCOSE 102*  BUN 62*  CREATININE 5.07*  CALCIUM 9.3   GFR: CrCl cannot be calculated (Unknown ideal weight.). Liver Function Tests: Recent Labs  Lab 09/17/19 1408  AST 15  ALT 9  ALKPHOS 112  BILITOT 0.5  PROT 7.1  ALBUMIN 3.7   No results for input(s): LIPASE, AMYLASE in the last 168 hours. No results for input(s): AMMONIA in the last 168 hours. Coagulation Profile: Recent Labs  Lab 09/17/19 1408  INR 1.7*   Cardiac Enzymes: No results for input(s): CKTOTAL, CKMB, CKMBINDEX, TROPONINI in the last 168 hours. BNP (last 3 results) No results for input(s): PROBNP in the last 8760 hours. HbA1C: No results for input(s): HGBA1C in the last 72 hours. CBG: Recent Labs  Lab 09/17/19 1318  GLUCAP 100*   Lipid Profile: No results for input(s): CHOL, HDL, LDLCALC, TRIG, CHOLHDL, LDLDIRECT in the last 72 hours. Thyroid Function Tests: No results for input(s): TSH, T4TOTAL,  FREET4, T3FREE, THYROIDAB in the last 72 hours. Anemia Panel: No results for input(s): VITAMINB12, FOLATE, FERRITIN, TIBC, IRON, RETICCTPCT in the last 72 hours. Urine analysis:    Component Value Date/Time   COLORURINE YELLOW 09/17/2019 1556   APPEARANCEUR CLEAR 09/17/2019 1556   LABSPEC 1.012 09/17/2019 1556   PHURINE 6.0 09/17/2019 1556   GLUCOSEU NEGATIVE 09/17/2019 1556   HGBUR NEGATIVE 09/17/2019 1556   BILIRUBINUR NEGATIVE 09/17/2019 1556   Two Strike 09/17/2019 1556   PROTEINUR 100 (A) 09/17/2019 1556   UROBILINOGEN 0.2 08/24/2015 1737   NITRITE NEGATIVE 09/17/2019 1556   LEUKOCYTESUR NEGATIVE 09/17/2019 1556   Sepsis Labs: @LABRCNTIP (procalcitonin:4,lacticidven:4) ) Recent Results (from the past 240 hour(s))  SARS CORONAVIRUS 2 (TAT 6-24 HRS) Nasopharyngeal Nasopharyngeal Swab     Status: None   Collection Time: 09/17/19  3:56 PM   Specimen: Nasopharyngeal Swab  Result Value Ref Range Status   SARS Coronavirus 2 NEGATIVE NEGATIVE Final    Comment: (NOTE) SARS-CoV-2 target nucleic acids are NOT DETECTED. The SARS-CoV-2 RNA is generally detectable in upper and lower respiratory specimens during the acute phase of infection. Negative results do not preclude SARS-CoV-2 infection, do not rule out co-infections with other pathogens, and should not be used as the sole basis for treatment or other patient management decisions. Negative results must be combined with clinical observations, patient history, and epidemiological information. The expected result is Negative. Fact Sheet for Patients: SugarRoll.be Fact Sheet for Healthcare Providers: https://www.woods-mathews.com/ This test is not yet approved or cleared by the [place] FDA and  has been authorized for detection and/or diagnosis of SARS-CoV-2 by FDA under an Emergency Use Authorization (EUA). This EUA will remain  in effect (meaning this test can be used) for  the duration of the COVID-19 declaration under Section 56 4(b)(1) of the Act, 21 U.S.C. section 360bbb-3(b)(1), unless the authorization is terminated or revoked sooner. Performed at [facility], [place] [address], [place], [place] [address]      Radiological Exams on Admission:  Ct Head Wo Contrast  Result Date: 09/17/2019 CLINICAL DATA:  Focal neuro deficit. Slurred speech and altered mental status since Thursday. EXAM: CT HEAD WITHOUT CONTRAST TECHNIQUE: Contiguous axial images were obtained from the base of the skull through the vertex without intravenous contrast. COMPARISON:  12/05/2017 MRI.  CT head dated 06/23/2017. FINDINGS: Brain: No evidence of acute infarction, hemorrhage, hydrocephalus, extra-axial collection or mass lesion/mass effect. Mild age related atrophy and chronic microvascular ischemic changes are noted bilaterally. Vascular: No hyperdense vessel or unexpected calcification. Skull: Normal. Negative for fracture or focal lesion. 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 Sinuses/Orbits: No acute finding. Other: None. IMPRESSION: No acute intracranial abnormality. Electronically Signed   By: Constance Holster M.D.   On: 09/17/2019 15:52   US Renal  Result Date: 09/17/2019 CLINICAL DATA:  Acute kidney injury EXAM: RENAL / URINARY TRACT ULTRASOUND COMPLETE COMPARISON:  03/08/2019 FINDINGS: Right Kidney: Renal measurements: 7.1 x 2.9 x 3.4 cm = volume: 36 mL. Severely atrophic with diffusely increased echotexture. 6 mm cyst. No hydronephrosis. Left Kidney: Renal measurements: 9.3 x 5.9 x 4.5 cm = volume: 130 mL. Small cysts, the largest 1.3 cm in the mid to upper pole. Diffuse increased echotexture. No hydronephrosis. Bladder: Decompressed, difficult to evaluate walls. Other: None. IMPRESSION: Diffusely increased echotexture compatible chronic medical renal disease. Severely atrophic right kidney. No hydronephrosis. Electronically Signed   By: Rolm Baptise M.D.    On: 09/17/2019 19:18   Dg Chest Portable 1 View  Result Date: 09/17/2019 CLINICAL DATA:  Altered mental status, slurred speech EXAM: PORTABLE CHEST 1 VIEW COMPARISON:  03/22/2019 chest radiograph. FINDINGS: Stable cardiomediastinal silhouette with normal heart size. No pneumothorax. No pleural effusion. Lungs appear clear, with no acute consolidative airspace disease and no pulmonary edema. IMPRESSION: No active disease. Electronically Signed   By: Ilona Sorrel M.D.   On: 09/17/2019 14:27    EKG: Independently reviewed.  Normal sinus rhythm LVH.  Assessment/Plan Principal Problem:   ARF (acute renal failure) (HCC) Active Problems:   Essential hypertension   Multiple falls   CKD (chronic kidney disease) stage 4, GFR 15-29 ml/min (HCC)   Acute encephalopathy    1. Acute renal failure likely from poor oral intake for which I have started patient on bicarb drip given the non-anion gap metabolic acidosis with worsening renal function.  We will continue with intake output and closely follow metabolic panel. 2. Acute encephalopathy could be secondary to worsening renal function in the setting of taking medications including gabapentin however since patient has slurring of speech will get MRI of the brain.  CT head is unremarkable.  Since patient is on valproic acid will check valproic acid levels and ammonia levels. 3. History of hypertension we will keep patient on as needed IV hydralazine for now. 4. Chronic anemia likely from renal disease follow CBC. 5. History of depression on valproic acid.  Will check valproic acid level and ammonia levels before continuing the medication. 6. History of peripheral vascular disease presently n.p.o. 7. Recent admission for sigmoid diverticulitis in August 2020.  Given the acute encephalopathy with acute renal failure non-anion gap metabolic acidosis patient will need inpatient status.  Since patient is confused I have kept patient n.p.o. Will get speech  therapy evaluation.   Addendum -MRI brain shows possibility of subarachnoid hemorrhage in the sylvian flexure for which I have consulted neurosurgery.   DVT prophylaxis: SCDs. Code Status: DNR confirmed with the patient's daughter. Family Communication: Patient's daughter. Disposition Plan: To  be determined. Consults called: Neurosurgery. Admission status: Inpatient.   Rise Patience MD [facility] Pager [phone].  If 7PM-7AM, please contact night-coverage [email or web address] Password [email or web address]  09/17/2019, 9:56 PM

## 2019-09-17 NOTE — ED Triage Notes (Signed)
Pt brought to ED via EMS from home. Pts family reports pt has had altered mental status and slurred speech since Thursday. Pt family reports pt is slightly confused at baseline with a hx of MI, HTN. Family reports pt had white discharge in urine this AM. Pt currently A&O x1.

## 2019-09-18 ENCOUNTER — Inpatient Hospital Stay (HOSPITAL_COMMUNITY): Payer: Medicare HMO

## 2019-09-18 DIAGNOSIS — I609 Nontraumatic subarachnoid hemorrhage, unspecified: Secondary | ICD-10-CM

## 2019-09-18 DIAGNOSIS — E872 Acidosis, unspecified: Secondary | ICD-10-CM | POA: Diagnosis present

## 2019-09-18 DIAGNOSIS — F0390 Unspecified dementia without behavioral disturbance: Secondary | ICD-10-CM | POA: Diagnosis present

## 2019-09-18 DIAGNOSIS — I10 Essential (primary) hypertension: Secondary | ICD-10-CM

## 2019-09-18 DIAGNOSIS — R296 Repeated falls: Secondary | ICD-10-CM

## 2019-09-18 DIAGNOSIS — N184 Chronic kidney disease, stage 4 (severe): Secondary | ICD-10-CM

## 2019-09-18 DIAGNOSIS — G9340 Encephalopathy, unspecified: Secondary | ICD-10-CM | POA: Diagnosis present

## 2019-09-18 LAB — CBG MONITORING, ED
Glucose-Capillary: 129 mg/dL — ABNORMAL HIGH (ref 70–99)
Glucose-Capillary: 112 mg/dL — ABNORMAL HIGH (ref 70–99)
Glucose-Capillary: 104 mg/dL — ABNORMAL HIGH (ref 70–99)

## 2019-09-18 LAB — BASIC METABOLIC PANEL
Anion gap: 16 — ABNORMAL HIGH (ref 5–15)
BUN: 59 mg/dL — ABNORMAL HIGH (ref 8–23)
CO2: 10 mmol/L — ABNORMAL LOW (ref 22–32)
Calcium: 9 mg/dL (ref 8.9–10.3)
Chloride: 122 mmol/L — ABNORMAL HIGH (ref 98–111)
Creatinine, Ser: 4.16 mg/dL — ABNORMAL HIGH (ref 0.44–1.00)
GFR calc Af Amer: 11 mL/min — ABNORMAL LOW (ref >60)
GFR calc non Af Amer: 10 mL/min — ABNORMAL LOW (ref >60)
Glucose, Bld: 121 mg/dL — ABNORMAL HIGH (ref 70–99)
Potassium: 4 mmol/L (ref 3.5–5.1)
Sodium: 148 mmol/L — ABNORMAL HIGH (ref 135–145)

## 2019-09-18 LAB — MRSA PCR SCREENING: MRSA by PCR: NEGATIVE

## 2019-09-18 LAB — GLUCOSE, CAPILLARY: Glucose-Capillary: 144 mg/dL — ABNORMAL HIGH (ref 70–99)

## 2019-09-18 MED ORDER — CHLORHEXIDINE GLUCONATE CLOTH 2 % EX PADS
6.0000 | MEDICATED_PAD | Freq: Every day | CUTANEOUS | Status: DC
  Administered 2019-09-19 – 2019-09-21 (×3): 6 via TOPICAL

## 2019-09-18 MED ORDER — SODIUM BICARBONATE-DEXTROSE 150-5 MEQ/L-% IV SOLN
150.0000 meq | INTRAVENOUS | Status: DC
  Administered 2019-09-18 – 2019-09-19 (×2): 150 meq via INTRAVENOUS
  Filled 2019-09-18 (×2): qty 1000

## 2019-09-18 MED ORDER — HYDRALAZINE HCL 20 MG/ML IJ SOLN
10.0000 mg | INTRAMUSCULAR | Status: DC | PRN
  Administered 2019-09-18 – 2019-09-19 (×4): 10 mg via INTRAVENOUS
  Filled 2019-09-18 (×4): qty 1

## 2019-09-18 NOTE — ED Notes (Signed)
Patient transported to MRI 

## 2019-09-18 NOTE — ED Notes (Signed)
Went into pts room due to IV beeping and pt was sitting on the floor trying to put her shoes on.  Pt pulled her IV out.  Pt states she wants to go home.  Pt put back into bed and moved to a room closer to the nurses station for closer monitoring

## 2019-09-18 NOTE — Progress Notes (Signed)
Patient ID: Alyssa [name], female   DOB: 1941-01-24, 78 y.o.   MRN: [id number] CT scan and MRI scan reviewed... Trace subarachnoid hemorrhage in the left sylvian fissure could be consistent with traumatic subarachnoid hemorrhage also could be a sign of underlying vascular abnormality or possible TIA or stroke.  This is clinically insignificant and nonsurgical.  Would hold all blood thinners patient is on aspirin and Plavix for 24 hours on aspirin 72 hours on Plavix if possible.  This does not explain slurred speech or significant mental status changes recommend neurology evaluation.  Consider repeating a CT scan in 24 hours.

## 2019-09-18 NOTE — Progress Notes (Signed)
PROGRESS NOTE    Alyssa [name]  EUM:[id number] DOB: 06/07/1941 DOA: 09/17/2019 PCP: Loretha Brasil, FNP    Brief Narrative:  78 year old female with a history of hypertension, chronic kidney disease stage IV, peripheral vascular disease, dementia, was brought to the hospital with poor p.o. intake, increasing lethargy, worsening generalized weakness.  She was noted to have acute kidney injury on chronic kidney disease, significant metabolic acidosis.  She had associated encephalopathy due to dehydration.  Imaging of the brain did not show any acute infarct, but did have incidental finding of trace subarachnoid hemorrhage.   Assessment & Plan:   Principal Problem:   ARF (acute renal failure) (HCC) Active Problems:   Essential hypertension   Multiple falls   CKD (chronic kidney disease) stage 4, GFR 15-29 ml/min (HCC)   Acute encephalopathy   Encephalopathy   1. Acute kidney injury on CKD stage 4.  Baseline creatinine around 1.7.  Patient presented with a creatinine of 5.  Family reports that she has had decreased p.o. intake for several weeks now.  Renal failure related to dehydration/decreased p.o. intake.  She was started on intravenous hydration with improvement of renal function.  Continue current treatments. 2. Acute metabolic encephalopathy.  Suspect this is related to significant uremia/dehydration.  CT head was found to be unremarkable.  MRI scan did show small subarachnoid hemorrhage.  This was not felt to be contributing to her symptoms.  MRI brain was negative for acute infarct.  Valproic acid level checked and was not elevated.  Ammonia level normal.  TSH normal.  After receiving hydration, family reports improvement of her mental status.  Would continue current treatments.  Patient will be kept n.p.o. for mental status.  Once her mental status has improved, can start diet. 3. History of peripheral vascular disease.  Will need to resume aspirin Plavix once mental status is  improved. 4. Subarachnoid hemorrhage.  Patient noted to have trace subarachnoid hemorrhage in the left sylvian fissure which could be consistent with traumatic subarachnoid hemorrhage.  Family does report a history of falls.  Was not felt that this was the cause of her encephalopathy.  Repeat CT scan tomorrow ordered for surveillance.  Will hold aspirin for for 24 hours and Plavix for 72 hours. 5. Repeated falls.  Suspect this is related to generalized weakness from dehydration and decreased p.o. intake.  Physical therapy evaluation. 6. Hypertension.  Use hydralazine as needed while she is n.p.o. 7. Metabolic acidosis.  Related to dehydration.  Continue on bicarbonate containing IV fluids.   DVT prophylaxis: SCDs Code Status: DNR Family Communication: Discussed with son Disposition Plan: Discharge home once improved   Consultants:   Neurosurgery  Procedures:     Antimicrobials:       Subjective: Speech is difficult to comprehend  Objective: Vitals:   09/18/19 0715 09/18/19 0940 09/18/19 0941 09/18/19 1106  BP: (!) 183/110 (!) 170/108 (!) 174/98 (!) 157/98  Pulse: 94 98 99 98  Resp: 18  18 18   Temp:      TempSrc:      SpO2: 100% 100% 100% 100%   No intake or output data in the 24 hours ending 09/18/19 2020 There were no vitals filed for this visit.  Examination:  General exam: Appears calm and comfortable, mucous membranes are very dry Respiratory system: Clear to auscultation. Respiratory effort normal. Cardiovascular system: S1 & S2 heard, RRR. No JVD, murmurs, rubs, gallops or clicks. No pedal edema. Gastrointestinal system: Abdomen is nondistended, soft and nontender. No  organomegaly or masses felt. Normal bowel sounds heard. Central nervous system: Limited exam since patient does not consistently follow commands, no focal neurological deficits.  Speech is difficult to comprehend Extremities: Symmetric 3/ 5 power. Skin: No rashes, lesions or ulcers Psychiatry:  Confused    Data Reviewed: I have personally reviewed following labs and imaging studies  CBC: Recent Labs  Lab 09/17/19 1408 09/17/19 2223  WBC 6.3 7.9  NEUTROABS 3.4  --   HGB 9.3* 10.0*  HCT 29.2* 32.5*  MCV 88.5 91.8  PLT 301 409   Basic Metabolic Panel: Recent Labs  Lab 09/17/19 1408 09/17/19 2223 09/18/19 1251  NA 144  --  148*  K 4.3  --  4.0  CL 124*  --  122*  CO2 8*  --  10*  GLUCOSE 102*  --  121*  BUN 62*  --  59*  CREATININE 5.07* 4.86* 4.16*  CALCIUM 9.3  --  9.0   GFR: CrCl cannot be calculated (Unknown ideal weight.). Liver Function Tests: Recent Labs  Lab 09/17/19 1408  AST 15  ALT 9  ALKPHOS 112  BILITOT 0.5  PROT 7.1  ALBUMIN 3.7   No results for input(s): LIPASE, AMYLASE in the last 168 hours. Recent Labs  Lab 09/17/19 2223  AMMONIA 19   Coagulation Profile: Recent Labs  Lab 09/17/19 1408  INR 1.7*   Cardiac Enzymes: Recent Labs  Lab 09/17/19 2223  CKTOTAL 254*   BNP (last 3 results) No results for input(s): PROBNP in the last 8760 hours. HbA1C: No results for input(s): HGBA1C in the last 72 hours. CBG: Recent Labs  Lab 09/17/19 1318 09/17/19 2156 09/18/19 0422 09/18/19 1133 09/18/19 1844  GLUCAP 100* 100* 129* 112* 104*   Lipid Profile: No results for input(s): CHOL, HDL, LDLCALC, TRIG, CHOLHDL, LDLDIRECT in the last 72 hours. Thyroid Function Tests: Recent Labs    09/17/19 2223  TSH 0.803   Anemia Panel: No results for input(s): VITAMINB12, FOLATE, FERRITIN, TIBC, IRON, RETICCTPCT in the last 72 hours. Sepsis Labs: No results for input(s): PROCALCITON, LATICACIDVEN in the last 168 hours.  Recent Results (from the past 240 hour(s))  SARS CORONAVIRUS 2 (TAT 6-24 HRS) Nasopharyngeal Nasopharyngeal Swab     Status: None   Collection Time: 09/17/19  3:56 PM   Specimen: Nasopharyngeal Swab  Result Value Ref Range Status   SARS Coronavirus 2 NEGATIVE NEGATIVE Final    Comment: (NOTE) SARS-CoV-2 target  nucleic acids are NOT DETECTED. The SARS-CoV-2 RNA is generally detectable in upper and lower respiratory specimens during the acute phase of infection. Negative results do not preclude SARS-CoV-2 infection, do not rule out co-infections with other pathogens, and should not be used as the sole basis for treatment or other patient management decisions. Negative results must be combined with clinical observations, patient history, and epidemiological information. The expected result is Negative. Fact Sheet for Patients: SugarRoll.be Fact Sheet for Healthcare Providers: https://www.woods-mathews.com/ This test is not yet approved or cleared by the [place] FDA and  has been authorized for detection and/or diagnosis of SARS-CoV-2 by FDA under an Emergency Use Authorization (EUA). This EUA will remain  in effect (meaning this test can be used) for the duration of the COVID-19 declaration under Section 56 4(b)(1) of the Act, 21 U.S.C. section 360bbb-3(b)(1), unless the authorization is terminated or revoked sooner. Performed at [facility], [place] [address], [place], [place] [address]          Radiology Studies: Ct Head  Wo Contrast  Result Date: 09/17/2019 CLINICAL DATA:  Focal neuro deficit. Slurred speech and altered mental status since Thursday. EXAM: CT HEAD WITHOUT CONTRAST TECHNIQUE: Contiguous axial images were obtained from the base of the skull through the vertex without intravenous contrast. COMPARISON:  12/05/2017 MRI.  CT head dated 06/23/2017. FINDINGS: Brain: No evidence of acute infarction, hemorrhage, hydrocephalus, extra-axial collection or mass lesion/mass effect. Mild age related atrophy and chronic microvascular ischemic changes are noted bilaterally. Vascular: No hyperdense vessel or unexpected calcification. Skull: Normal. Negative for fracture or focal lesion. 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  Sinuses/Orbits: No acute finding. Other: None. IMPRESSION: No acute intracranial abnormality. Electronically Signed   By: Constance Holster M.D.   On: 09/17/2019 15:52   Mr Angio Head Wo Contrast  Result Date: 09/18/2019 CLINICAL DATA:  Cerebral aneurysm, subarachnoid hemorrhage, cerebral vasospasm evaluation. EXAM: MRA HEAD WITHOUT CONTRAST TECHNIQUE: Angiographic images of the Circle of Willis were obtained using MRA technique without intravenous contrast. COMPARISON:  Brain MRI performed earlier the same day 09/18/2019, head CT 09/17/2019 FINDINGS: The examination is significantly motion degraded. This limits evaluation for stenoses and for small aneurysms. This also precludes adequate evaluation for vasospasm. The intracranial internal carotid arteries are patent with atherosclerotic irregularity. Apparent moderate/severe stenosis within the cavernous left ICA may be accentuated by vessel tortuosity at this site. Apparent moderate focal stenosis within the cavernous right ICA. The right middle cerebral artery is patent. Mild/moderate focal stenosis within the distal M1 right MCA may reflect atherosclerotic narrowing or vasospasm. Motion degradation precludes adequate evaluation of the M2 right MCA branch vessels. The M1 left middle cerebral artery is patent without appreciable stenosis. Motion degradation precludes adequate evaluation of the M2 left MCA branch vessels. Motion degradation precludes adequate evaluation of the anterior cerebral arteries. No definite intracranial aneurysm is identified. Significant atherosclerotic irregularity of the non-dominant intracranial right vertebral artery which likely terminates predominantly as the right PICA. The dominant intracranial left vertebral artery is patent without significant stenosis, as is the basilar artery. Flow related signal is present within the proximal posterior cerebral arteries bilaterally. IMPRESSION: 1. Significantly motion degraded  examination. This limits evaluation for stenoses and for small aneurysms. This also precludes adequate evaluation for vasospasm. 2. Atherosclerotic irregularity of the bilateral intracranial internal carotid arteries. Apparent moderate/severe stenosis within the cavernous left ICA may be accentuated by vessel tortuosity at this site. Apparent moderate focal stenosis within the cavernous right ICA. 3. Mild/moderate focal stenosis within the distal M1 right MCA, which may reflect atherosclerotic narrowing or vasospasm. 4. Motion degradation precludes adequate evaluation of the bilateral M2 MCA branch vessels and of the bilateral anterior cerebral arteries. 5. Significant atherosclerotic irregularity of the non dominant intracranial right vertebral artery, which likely terminates predominantly as the right PICA. 6. No definite intracranial aneurysm is identified. Electronically Signed   By: Kellie Simmering DO   On: 09/18/2019 09:58   Mr Brain Wo Contrast  Result Date: 09/18/2019 CLINICAL DATA:  78 year old female with slurred speech and altered mental status. EXAM: MRI HEAD WITHOUT CONTRAST TECHNIQUE: Multiplanar, multiecho pulse sequences of the brain and surrounding structures were obtained without intravenous contrast. COMPARISON:  Brain MRI 12/05/2017.  Head CT earlier tonight. FINDINGS: Brain: There is trace abnormal T1 and FLAIR signal in the left sylvian fissure on series 11, image 13 and series 12, image 13 which corresponds to subtle hyperdensity on the earlier CT. This is new compared to the 2019 MRI. Regional flow voids on T2  imaging seem to remain stable. No diffusion restriction in the region. Only subtle associated T2* signal loss. No similar abnormal subarachnoid signal elsewhere. No intraventricular blood or debris. No restricted diffusion to suggest acute infarction. No midline shift, mass effect, evidence of mass lesion, ventriculomegaly. Pituitary normal limits. Motion artifact on sagittal T1  weighted imaging at the cervicomedullary junction and upper cervical spine. Chronic widely scattered and patchy bilateral cerebral white matter T2 and FLAIR hyperintensity appears not significantly changed since 2019. No definite cortical encephalomalacia. No chronic cerebral blood products suspected. Vascular: Major intracranial vascular flow voids are stable. Dominant left vertebral artery. Skull and upper cervical spine: Visualized bone marrow signal is within normal limits. Cervical spine obscured by motion. Sinuses/Orbits: Stable, negative. Other: Mastoids remain clear. Scalp and face soft tissues appear negative. IMPRESSION: 1. Small volume abnormal T1 and FLAIR signal which seems to be in the posterior left sylvian fissure is favored to correspond to Trace Subarachnoid Hemorrhage which was subtle on the head CT at 1537 hours yesterday. Although nonspecific similar small volume subarachnoid blood can be seen in the elderly after a fall. Consider a repeat non-contrast Head CT in the next several hours (coming up on 12 hours from the prior) for correlation. 2. No associated acute infarct or other acute intracranial abnormality identified. Electronically Signed   By: Genevie Ann M.D.   On: 09/18/2019 01:34   US Renal  Result Date: 09/17/2019 CLINICAL DATA:  Acute kidney injury EXAM: RENAL / URINARY TRACT ULTRASOUND COMPLETE COMPARISON:  03/08/2019 FINDINGS: Right Kidney: Renal measurements: 7.1 x 2.9 x 3.4 cm = volume: 36 mL. Severely atrophic with diffusely increased echotexture. 6 mm cyst. No hydronephrosis. Left Kidney: Renal measurements: 9.3 x 5.9 x 4.5 cm = volume: 130 mL. Small cysts, the largest 1.3 cm in the mid to upper pole. Diffuse increased echotexture. No hydronephrosis. Bladder: Decompressed, difficult to evaluate walls. Other: None. IMPRESSION: Diffusely increased echotexture compatible chronic medical renal disease. Severely atrophic right kidney. No hydronephrosis. Electronically Signed   By:  Rolm Baptise M.D.   On: 09/17/2019 19:18   Dg Chest Portable 1 View  Result Date: 09/17/2019 CLINICAL DATA:  Altered mental status, slurred speech EXAM: PORTABLE CHEST 1 VIEW COMPARISON:  03/22/2019 chest radiograph. FINDINGS: Stable cardiomediastinal silhouette with normal heart size. No pneumothorax. No pleural effusion. Lungs appear clear, with no acute consolidative airspace disease and no pulmonary edema. IMPRESSION: No active disease. Electronically Signed   By: Ilona Sorrel M.D.   On: 09/17/2019 14:27        Scheduled Meds: Continuous Infusions:  sodium bicarbonate 150 mEq in dextrose 5% 1000 mL       LOS: 1 day    Time spent: 59mins    Kathie Dike, MD [facility]   If 7PM-7AM, please contact night-coverage [email or web address]  09/18/2019, 8:20 PM

## 2019-09-19 ENCOUNTER — Inpatient Hospital Stay (HOSPITAL_COMMUNITY): Payer: Medicare HMO

## 2019-09-19 ENCOUNTER — Ambulatory Visit: Payer: Medicare HMO | Admitting: Cardiology

## 2019-09-19 ENCOUNTER — Other Ambulatory Visit: Payer: Self-pay

## 2019-09-19 LAB — CBC
HCT: 27.7 % — ABNORMAL LOW (ref 36.0–46.0)
Hemoglobin: 9.8 g/dL — ABNORMAL LOW (ref 12.0–15.0)
MCH: 28.4 pg (ref 26.0–34.0)
MCHC: 35.4 g/dL (ref 30.0–36.0)
MCV: 80.3 fL (ref 80.0–100.0)
Platelets: 280 10*3/uL (ref 150–400)
RBC: 3.45 MIL/uL — ABNORMAL LOW (ref 3.87–5.11)
RDW: 15.5 % (ref 11.5–15.5)
WBC: 9.4 10*3/uL (ref 4.0–10.5)
nRBC: 0.4 % — ABNORMAL HIGH (ref 0.0–0.2)

## 2019-09-19 LAB — COMPREHENSIVE METABOLIC PANEL
ALT: 8 U/L (ref 0–44)
AST: 18 U/L (ref 15–41)
Albumin: 3.4 g/dL — ABNORMAL LOW (ref 3.5–5.0)
Alkaline Phosphatase: 104 U/L (ref 38–126)
Anion gap: 17 — ABNORMAL HIGH (ref 5–15)
BUN: 49 mg/dL — ABNORMAL HIGH (ref 8–23)
CO2: 19 mmol/L — ABNORMAL LOW (ref 22–32)
Calcium: 9 mg/dL (ref 8.9–10.3)
Chloride: 115 mmol/L — ABNORMAL HIGH (ref 98–111)
Creatinine, Ser: 3.49 mg/dL — ABNORMAL HIGH (ref 0.44–1.00)
GFR calc Af Amer: 14 mL/min — ABNORMAL LOW (ref >60)
GFR calc non Af Amer: 12 mL/min — ABNORMAL LOW (ref >60)
Glucose, Bld: 155 mg/dL — ABNORMAL HIGH (ref 70–99)
Potassium: 3.2 mmol/L — ABNORMAL LOW (ref 3.5–5.1)
Sodium: 151 mmol/L — ABNORMAL HIGH (ref 135–145)
Total Bilirubin: 0.2 mg/dL — ABNORMAL LOW (ref 0.3–1.2)
Total Protein: 6.5 g/dL (ref 6.5–8.1)

## 2019-09-19 LAB — HEMOGLOBIN A1C
Hgb A1c MFr Bld: 6.5 % — ABNORMAL HIGH (ref 4.8–5.6)
Mean Plasma Glucose: 139.85 mg/dL

## 2019-09-19 LAB — GLUCOSE, CAPILLARY
Glucose-Capillary: 196 mg/dL — ABNORMAL HIGH (ref 70–99)
Glucose-Capillary: 123 mg/dL — ABNORMAL HIGH (ref 70–99)
Glucose-Capillary: 105 mg/dL — ABNORMAL HIGH (ref 70–99)

## 2019-09-19 MED ORDER — INSULIN ASPART 100 UNIT/ML ~~LOC~~ SOLN: 0.0000 [IU] | Freq: Every day | SUBCUTANEOUS | Status: DC

## 2019-09-19 MED ORDER — METOPROLOL TARTRATE 50 MG PO TABS
50.0000 mg | ORAL_TABLET | Freq: Two times a day (BID) | ORAL | Status: DC
  Administered 2019-09-19 – 2019-09-22 (×7): 50 mg via ORAL
  Filled 2019-09-19 (×7): qty 1

## 2019-09-19 MED ORDER — ISOSORB DINITRATE-HYDRALAZINE 20-37.5 MG PO TABS
2.0000 | ORAL_TABLET | Freq: Three times a day (TID) | ORAL | Status: DC
  Administered 2019-09-19 – 2019-09-22 (×8): 2 via ORAL
  Filled 2019-09-19 (×13): qty 2

## 2019-09-19 MED ORDER — ORAL CARE MOUTH RINSE
15.0000 mL | Freq: Two times a day (BID) | OROMUCOSAL | Status: DC
  Administered 2019-09-19 – 2019-09-21 (×6): 15 mL via OROMUCOSAL

## 2019-09-19 MED ORDER — VALPROIC ACID 250 MG PO CAPS
250.0000 mg | ORAL_CAPSULE | Freq: Two times a day (BID) | ORAL | Status: DC
  Administered 2019-09-19 – 2019-09-22 (×7): 250 mg via ORAL
  Filled 2019-09-19 (×8): qty 1

## 2019-09-19 MED ORDER — POTASSIUM CHLORIDE 10 MEQ/100ML IV SOLN
10.0000 meq | INTRAVENOUS | Status: AC
  Administered 2019-09-19 (×4): 10 meq via INTRAVENOUS
  Filled 2019-09-19 (×4): qty 100

## 2019-09-19 MED ORDER — FUROSEMIDE 10 MG/ML IJ SOLN
INTRAMUSCULAR | Status: AC
  Filled 2019-09-19: qty 4

## 2019-09-19 MED ORDER — INSULIN ASPART 100 UNIT/ML ~~LOC~~ SOLN
0.0000 [IU] | Freq: Three times a day (TID) | SUBCUTANEOUS | Status: DC
  Administered 2019-09-19 – 2019-09-21 (×3): 2 [IU] via SUBCUTANEOUS

## 2019-09-19 MED ORDER — ASPIRIN 81 MG PO TBEC
81.0000 mg | DELAYED_RELEASE_TABLET | Freq: Every day | ORAL | Status: DC
  Filled 2019-09-19: qty 1

## 2019-09-19 MED ORDER — CITALOPRAM HYDROBROMIDE 10 MG PO TABS
20.0000 mg | ORAL_TABLET | Freq: Every day | ORAL | Status: DC
  Administered 2019-09-19 – 2019-09-22 (×4): 20 mg via ORAL
  Filled 2019-09-19 (×4): qty 2

## 2019-09-19 MED ORDER — DEXTROSE 5 % IV SOLN
INTRAVENOUS | Status: DC
  Administered 2019-09-19 (×2): via INTRAVENOUS

## 2019-09-19 MED ORDER — ROSUVASTATIN CALCIUM 20 MG PO TABS
40.0000 mg | ORAL_TABLET | Freq: Every day | ORAL | Status: DC
  Administered 2019-09-19 – 2019-09-21 (×3): 40 mg via ORAL
  Filled 2019-09-19 (×3): qty 2

## 2019-09-19 MED ORDER — PANTOPRAZOLE SODIUM 40 MG PO TBEC
40.0000 mg | DELAYED_RELEASE_TABLET | Freq: Every day | ORAL | Status: DC
  Administered 2019-09-19 – 2019-09-22 (×4): 40 mg via ORAL
  Filled 2019-09-19 (×4): qty 1

## 2019-09-19 MED ORDER — CLOPIDOGREL BISULFATE 75 MG PO TABS: 75.0000 mg | ORAL_TABLET | Freq: Every day | ORAL | Status: DC

## 2019-09-19 MED ORDER — DONEPEZIL HCL 5 MG PO TABS
5.0000 mg | ORAL_TABLET | Freq: Every day | ORAL | Status: DC
  Administered 2019-09-19 – 2019-09-22 (×4): 5 mg via ORAL
  Filled 2019-09-19 (×4): qty 1

## 2019-09-19 NOTE — Care Management (Addendum)
Spoke with patient's [name] to confirm support at discharge:  He states that pt will have 24/7 support when discharged home, provided by her significant other, and her other son.  [name] prefers home health for patient at discharge.    Reinaldo Raddle, RN, BSN  Trauma/Neuro ICU Case Manager [phone]

## 2019-09-19 NOTE — Evaluation (Signed)
Clinical/Bedside Swallow Evaluation Patient Details  Name: Alyssa [name] MRN: [id number] Date of Birth: 06-08-1941  Today's Date: 09/19/2019 Time:        Past Medical History:  Past Medical History:  Diagnosis Date  . Anxiety   . Arthritis   . Chest pain   . Chronic kidney disease   . Elevated troponin 12/13/2018  . Headache(784.0)   . Hyperlipidemia   . Hypertension   . Joint pain   . Leg pain   . Peripheral neuropathy   . Peripheral vascular disease [facility])    Past Surgical History:  Past Surgical History:  Procedure Laterality Date  . ABDOMINAL ANGIOGRAM N/A 10/29/2011   Procedure: ABDOMINAL ANGIOGRAM;  Surgeon: Elam Dutch, MD;  Location: [facility] CATH LAB;  Service: Cardiovascular;  Laterality: N/A;  . BACK SURGERY    . FEMORAL-FEMORAL BYPASS GRAFT  08/31/2010  . FLEXIBLE SIGMOIDOSCOPY N/A 06/12/2019   Procedure: FLEXIBLE SIGMOIDOSCOPY;  Surgeon: Ladene Artist, MD;  Location: [facility] ENDOSCOPY;  Service: Endoscopy;  Laterality: N/A;  Patient had little bit to eat this morning so this will be unsedated.  Marland Kitchen JOINT REPLACEMENT Right 2012   Hip  . LOWER EXTREMITY ANGIOGRAPHY  06/14/2017   Procedure: Lower Extremity Angiography;  Surgeon: Adrian Prows, MD;  Location: [facility] CV LAB;  Service: Cardiovascular;;  Bilateral limited angio performed  . RENAL ANGIOGRAPHY N/A 06/14/2017   Procedure: Renal Angiography;  Surgeon: Adrian Prows, MD;  Location: [facility] CV LAB;  Service: Cardiovascular;  Laterality: N/A;  . TOTAL HIP ARTHROPLASTY     right   HPI:  78 year old female with a history of hypertension, chronic kidney disease stage IV, peripheral vascular disease, dementia, was brought to the hospital with poor p.o. intake, increasing lethargy, worsening generalized weakness.  She was noted to have acute kidney injury on chronic kidney disease, significant metabolic acidosis.  She had associated encephalopathy due to dehydration.  Imaging of the brain did not show any acute  infarct, but did have incidental finding of trace subarachnoid hemorrhage. BSE 12/28/18 with recommendation for Dys 1 and thin.    Assessment / Plan / Recommendation Clinical Impression  Pt demonstrated functional swallowing capacity given thin, puree and solid textures. Mild left lingual residue with cracker cleared with cues for lingual sweep and sips thin. Multiple straw sips water (approx 2 oz) consumed without s/s aspiration. She is alert and following commands. Recommend Dys 3 texture due to lack of upper dentition, thin liquids and pills with water. ST will briefly follow for pt's ability to manipulate Dys 3 texture in efficient manner.    SLP Visit Diagnosis: Dysphagia, unspecified (R13.10)    Aspiration Risk  Mild aspiration risk    Diet Recommendation Dysphagia 3 (Mech soft);Thin liquid   Medication Administration: Whole meds with liquid Supervision: Patient able to self feed Compensations: Slow rate;Small sips/bites    Other  Recommendations Oral Care Recommendations: Oral care BID   Follow up Recommendations None      Frequency and Duration min 1 x/week  1 week       Prognosis Prognosis for Safe Diet Advancement: Good      Swallow Study   General HPI: 78 year old female with a history of hypertension, chronic kidney disease stage IV, peripheral vascular disease, dementia, was brought to the hospital with poor p.o. intake, increasing lethargy, worsening generalized weakness.  She was noted to have acute kidney injury on chronic kidney disease, significant metabolic acidosis.  She had associated encephalopathy due to dehydration.  Imaging of the brain did not show any acute infarct, but did have incidental finding of trace subarachnoid hemorrhage. BSE 12/28/18 with recommendation for Dys 1 and thin.  Type of Study: Bedside Swallow Evaluation Previous Swallow Assessment: (see HPI) Diet Prior to this Study: NPO Temperature Spikes Noted: No Respiratory Status: Room  air History of Recent Intubation: No Behavior/Cognition: Alert;Cooperative;Pleasant mood Oral Cavity Assessment: (significant candidia) Oral Care Completed by SLP: No Oral Cavity - Dentition: Poor condition;Missing dentition(missing all upper and posterior lower) Vision: Functional for self-feeding Self-Feeding Abilities: Able to feed self Patient Positioning: Upright in chair Baseline Vocal Quality: Normal Volitional Cough: Strong Volitional Swallow: Able to elicit    Oral/Motor/Sensory Function Overall Oral Motor/Sensory Function: Mild impairment(mild right weakness?) Lingual ROM: Within Functional Limits   Ice Chips Ice chips: Not tested   Thin Liquid Thin Liquid: Within functional limits Presentation: Cup;Straw    Nectar Thick Nectar Thick Liquid: Not tested   Honey Thick Honey Thick Liquid: Not tested   Puree Puree: Within functional limits   Solid     Solid: Impaired Oral Phase Impairments: Reduced lingual movement/coordination Oral Phase Functional Implications: Left lateral sulci pocketing Pharyngeal Phase Impairments: (none)      Krisanne Lich, Orbie Pyo 09/19/2019,4:15 PM  Orbie Pyo [email or web address].Ed Risk analyst [phone] Office [phone]

## 2019-09-19 NOTE — Progress Notes (Signed)
Physical Therapy Treatment Patient Details Name: Alyssa [name] MRN: [id number] DOB: 02/04/1941 Today's Date: 09/19/2019    History of Present Illness 78 year old female with a history of hypertension, chronic kidney disease stage IV, peripheral vascular disease, dementia, was brought to the hospital with poor p.o. intake, increasing lethargy, worsening generalized weakness.  She was noted to have acute kidney injury on chronic kidney disease, significant metabolic acidosis.  She had associated encephalopathy due to dehydration.  Imaging of the brain did not show any acute infarct, but did have incidental finding of trace subarachnoid hemorrhage.    PT Comments    Pt admitted with/for confusion, letharty and generalized weakness.  Pt now needing minimal assist and definitely not at her baseline mod I.  Going home safely depends on if family can provide short term supervision as needed, otherwise pt should have short-term rehab at [facility].Marland Kitchen  Pt currently limited functionally due to the problems listed below.  (see problems list.)  Pt will benefit from PT to maximize function and safety to be able to get home safely with higher level of assist.    Follow Up Recommendations  Home health PT;Supervision/Assistance - 24 hour;Other (comment)(SNF if cannot provide ST 24/7 supervision.)     Equipment Recommendations  None recommended by PT    Recommendations for Other Services       Precautions / Restrictions Precautions Precautions: Fall    Mobility  Bed Mobility Overal bed mobility: Needs Assistance Bed Mobility: Supine to Sit     Supine to sit: Min guard     General bed mobility comments: slow and painful due to pt trying to use bil UE's and L UE is painful  Transfers Overall transfer level: Needs assistance   Transfers: Sit to/from Stand Sit to Stand: Min assist         General transfer comment: extra assist due to decreased use of the L UE.  stability  assist  Ambulation/Gait Ambulation/Gait assistance: Min assist Gait Distance (Feet): 20 Feet Assistive device: IV Pole Gait Pattern/deviations: Step-through pattern Gait velocity: slow Gait velocity interpretation: <1.31 ft/sec, indicative of household ambulator General Gait Details: short, guarded, mildly unsteady steps.   Stairs             Wheelchair Mobility    Modified Rankin (Stroke Patients Only)       Balance Overall balance assessment: Needs assistance Sitting-balance support: No upper extremity supported;Single extremity supported Sitting balance-Leahy Scale: Fair     Standing balance support: Single extremity supported;No upper extremity supported Standing balance-Leahy Scale: Poor Standing balance comment: needed minor external support today, expect her to improve steadily                            Cognition Arousal/Alertness: Awake/alert Behavior During Therapy: WFL for tasks assessed/performed Overall Cognitive Status: Within Functional Limits for tasks assessed(oriented)                                        Exercises      General Comments General comments (skin integrity, edema, etc.): EHR 140's, pt notices      Pertinent Vitals/Pain Pain Assessment: Faces Faces Pain Scale: Hurts even more Pain Location: left shoulder/supraspinatus area Pain Descriptors / Indicators: Grimacing;Guarding;Sharp Pain Intervention(s): Monitored during session    Home Living Family/patient expects to be discharged to:: Private residence Living Arrangements: Alone Available  Help at Discharge: Family;Available PRN/intermittently(son close by and sister lives next door.) Type of Home: House Home Access: Stairs to enter Entrance Stairs-Rails: Right;Left Home Layout: One level Home Equipment: Environmental consultant - 2 wheels;Walker - 4 wheels;Bedside commode      Prior Function Level of Independence: Needs assistance  Gait / Transfers  Assistance Needed: pt ambulates with a RW or cane per pt ADL's / Homemaking Assistance Needed: Does her own ADLs per report. Sister drives and does the IADLS.     PT Goals (current goals can now be found in the care plan section) Acute Rehab PT Goals Patient Stated Goal: All I want right now is coffee. PT Goal Formulation: With patient Time For Goal Achievement: 10/03/19 Potential to Achieve Goals: Good    Frequency    Min 3X/week      PT Plan      Co-evaluation              AM-PAC PT "6 Clicks" Mobility   Outcome Measure  Help needed turning from your back to your side while in a flat bed without using bedrails?: A Little Help needed moving from lying on your back to sitting on the side of a flat bed without using bedrails?: A Little Help needed moving to and from a bed to a chair (including a wheelchair)?: A Little Help needed standing up from a chair using your arms (e.g., wheelchair or bedside chair)?: A Little Help needed to walk in hospital room?: A Little Help needed climbing 3-5 steps with a railing? : A Lot 6 Click Score: 17    End of Session   Activity Tolerance: Patient tolerated treatment well Patient left: in chair;with call bell/phone within reach;with chair alarm set(with SLP) Nurse Communication: Mobility status PT Visit Diagnosis: Other abnormalities of gait and mobility (R26.89);Pain;Muscle weakness (generalized) (M62.81) Pain - Right/Left: Left Pain - part of body: Shoulder     Time: 9417-4081 PT Time Calculation (min) (ACUTE ONLY): 35 min  Charges:  $Gait Training: 8-22 mins                     09/19/2019  Donnella Sham, PT Acute Rehabilitation Services [phone]  (pager) [phone]  (office)   Tessie Fass [place] 09/19/2019, 12:15 PM

## 2019-09-19 NOTE — Progress Notes (Addendum)
PROGRESS NOTE    Alyssa [name]  KGS:[id number] DOB: [birth year] DOA: 09/17/2019 PCP: Loretha Brasil, FNP    Brief Narrative:  78 year old female with a history of hypertension, chronic kidney disease stage IV, peripheral vascular disease, dementia, was brought to the hospital with poor p.o. intake, increasing lethargy, worsening generalized weakness.  She was noted to have acute kidney injury on chronic kidney disease, significant metabolic acidosis.  She had associated encephalopathy due to dehydration.  Imaging of the brain did not show any acute infarct, but did have incidental finding of trace subarachnoid hemorrhage.   Assessment & Plan:   Principal Problem:   ARF (acute renal failure) (HCC) Active Problems:   Essential hypertension   Multiple falls   CKD (chronic kidney disease) stage 4, GFR 15-29 ml/min (HCC)   Acute encephalopathy   Encephalopathy   SAH (subarachnoid hemorrhage) (HCC)   Dementia (HCC)   Metabolic acidosis   1. Acute kidney injury on CKD stage 4.  Baseline creatinine around 1.7.  Patient presented with a creatinine of 5.  Family reports that she has had decreased p.o. intake for several weeks now.  Renal failure related to dehydration/decreased p.o. intake.  She was started on intravenous hydration with improvement of renal function.  Continue current treatments. 2. Acute metabolic encephalopathy.  Suspect this is related to significant uremia/dehydration.  CT head was found to be unremarkable.  MRI scan did show small subarachnoid hemorrhage.  This was not felt to be contributing to her symptoms.  MRI brain was negative for acute infarct.  Valproic acid level checked and was not elevated.  Ammonia level normal.  TSH normal.  Patient has been receiving IV hydration and has had a substantial improvement in her mental status.  Will restart her diet today since she should be able to safely have p.o. intake. 3. History of peripheral vascular disease.  Will need  to resume aspirin Plavix in the next 24 to 72 hours.. 4. Subarachnoid hemorrhage.  Patient noted to have trace subarachnoid hemorrhage in the left sylvian fissure which could be consistent with traumatic subarachnoid hemorrhage.  Family does report a history of falls.  Images reviewed by neurosurgery and findings felt to be nonoperative. SAH was not felt to be the cause of her encephalopathy.  Repeat CT scan done 12/2 shows regression of hemorrhage.  Will hold aspirin for another 24 hours (start on 12/3) and Plavix for 72 hours (start on 12/6). 5. Repeated falls.  Suspect this is related to generalized weakness from dehydration and decreased p.o. intake.  Physical therapy evaluation. 6. Neck pain.  Patient does describe significant neck pain with pain moving her neck to the left.  Since she did suffer a fall, will check CT of her C-spine. 7. Hypertension.  Restart home BiDil, metoprolol 8. Metabolic acidosis.  Related to dehydration.  Improved with bicarb infusion.  Continue IV fluids. 9. Sinus tachycardia.  Heart rate in the 120s to 130s.  Patient is asymptomatic.  Suspect there is some element of reflex tachycardia since she has not been receiving metoprolol.  Restart beta-blockers. 10. Hypernatremia.  Suspect this related to dehydration as well as IV fluids.  Change IV fluids to D5W 11. Hypokalemia.  Replace   DVT prophylaxis: SCDs Code Status: DNR Family Communication: Discussed with son 12/2 Disposition Plan: Discharge home once improved   Consultants:   Neurosurgery  Procedures:     Antimicrobials:       Subjective: Complains of pain in her right neck/shoulder area.  Has pain when moves her head to the left.  She is more coherent today.  Wants to drink coffee.  Denies any shortness of breath.  Objective: Vitals:   09/19/19 0400 09/19/19 0500 09/19/19 0600 09/19/19 0800  BP: (!) 179/103 (!) 168/101 (!) 163/92   Pulse: (!) 114 (!) 108 (!) 110   Resp: 15 20 16    Temp: 98 F  (36.7 C)   98 F (36.7 C)  TempSrc: Oral   Axillary  SpO2: 100% 100% 100%     Intake/Output Summary (Last 24 hours) at 09/19/2019 1050 Last data filed at 09/19/2019 0600 Gross per 24 hour  Intake 624.94 ml  Output 300 ml  Net 324.94 ml   There were no vitals filed for this visit.  Examination:  General exam: Appears calm and comfortable Respiratory system: Clear to auscultation. Respiratory effort normal. Cardiovascular system: S1 & S2 heard, tachycardic. No JVD, murmurs, rubs, gallops or clicks. No pedal edema. Gastrointestinal system: Abdomen is nondistended, soft and nontender. No organomegaly or masses felt. Normal bowel sounds heard. Central nervous system:  no focal neurological deficits.  Extremities: Symmetric 3/ 5 power. Tender over left neck and trapezius Skin: No rashes, lesions or ulcers Psychiatry: appropriate, pleasant    Data Reviewed: I have personally reviewed following labs and imaging studies  CBC: Recent Labs  Lab 09/17/19 1408 09/17/19 2223  WBC 6.3 7.9  NEUTROABS 3.4  --   HGB 9.3* 10.0*  HCT 29.2* 32.5*  MCV 88.5 91.8  PLT 301 027   Basic Metabolic Panel: Recent Labs  Lab 09/17/19 1408 09/17/19 2223 09/18/19 1251 09/19/19 0632  NA 144  --  148* 151*  K 4.3  --  4.0 3.2*  CL 124*  --  122* 115*  CO2 8*  --  10* 19*  GLUCOSE 102*  --  121* 155*  BUN 62*  --  59* 49*  CREATININE 5.07* 4.86* 4.16* 3.49*  CALCIUM 9.3  --  9.0 9.0   GFR: CrCl cannot be calculated (Unknown ideal weight.). Liver Function Tests: Recent Labs  Lab 09/17/19 1408 09/19/19 0632  AST 15 18  ALT 9 8  ALKPHOS 112 104  BILITOT 0.5 0.2*  PROT 7.1 6.5  ALBUMIN 3.7 3.4*   No results for input(s): LIPASE, AMYLASE in the last 168 hours. Recent Labs  Lab 09/17/19 2223  AMMONIA 19   Coagulation Profile: Recent Labs  Lab 09/17/19 1408  INR 1.7*   Cardiac Enzymes: Recent Labs  Lab 09/17/19 2223  CKTOTAL 254*   BNP (last 3 results) No results for  input(s): PROBNP in the last 8760 hours. HbA1C: No results for input(s): HGBA1C in the last 72 hours. CBG: Recent Labs  Lab 09/17/19 2156 09/18/19 0422 09/18/19 1133 09/18/19 1844 09/18/19 2323  GLUCAP 100* 129* 112* 104* 144*   Lipid Profile: No results for input(s): CHOL, HDL, LDLCALC, TRIG, CHOLHDL, LDLDIRECT in the last 72 hours. Thyroid Function Tests: Recent Labs    09/17/19 2223  TSH 0.803   Anemia Panel: No results for input(s): VITAMINB12, FOLATE, FERRITIN, TIBC, IRON, RETICCTPCT in the last 72 hours. Sepsis Labs: No results for input(s): PROCALCITON, LATICACIDVEN in the last 168 hours.  Recent Results (from the past 240 hour(s))  SARS CORONAVIRUS 2 (TAT 6-24 HRS) Nasopharyngeal Nasopharyngeal Swab     Status: None   Collection Time: 09/17/19  3:56 PM   Specimen: Nasopharyngeal Swab  Result Value Ref Range Status   SARS Coronavirus 2 NEGATIVE NEGATIVE Final  Comment: (NOTE) SARS-CoV-2 target nucleic acids are NOT DETECTED. The SARS-CoV-2 RNA is generally detectable in upper and lower respiratory specimens during the acute phase of infection. Negative results do not preclude SARS-CoV-2 infection, do not rule out co-infections with other pathogens, and should not be used as the sole basis for treatment or other patient management decisions. Negative results must be combined with clinical observations, patient history, and epidemiological information. The expected result is Negative. Fact Sheet for Patients: SugarRoll.be Fact Sheet for Healthcare Providers: https://www.woods-mathews.com/ This test is not yet approved or cleared by the [place] FDA and  has been authorized for detection and/or diagnosis of SARS-CoV-2 by FDA under an Emergency Use Authorization (EUA). This EUA will remain  in effect (meaning this test can be used) for the duration of the COVID-19 declaration under Section 56 4(b)(1) of the Act, 21  U.S.C. section 360bbb-3(b)(1), unless the authorization is terminated or revoked sooner. Performed at [facility], [place] [address] [place], [place] [address]   MRSA PCR Screening     Status: None   Collection Time: 09/18/19  8:10 PM   Specimen: Nasal Mucosa; Nasopharyngeal  Result Value Ref Range Status   MRSA by PCR NEGATIVE NEGATIVE Final    Comment:        The GeneXpert MRSA Assay (FDA approved for NASAL specimens only), is one component of a comprehensive MRSA colonization surveillance program. It is not intended to diagnose MRSA infection nor to guide or monitor treatment for MRSA infections. Performed at [facility], [place] [address], [place], [place] [address]          Radiology Studies: Ct Head Wo Contrast  Result Date: 09/19/2019 CLINICAL DATA:  78 year old female with suspected trace subarachnoid hemorrhage in the left sylvian fissure on MRI yesterday. According to the EMR the patient did have a recent fall. EXAM: CT HEAD WITHOUT CONTRAST TECHNIQUE: Contiguous axial images were obtained from the base of the skull through the vertex without intravenous contrast. COMPARISON:  Brain MRI 09/18/2019 and head CT 09/17/2019. FINDINGS: Brain: Trace hyperdensity along the posterior left sylvian fissure does appear regressed from the head CT yesterday (compare sagittal image 37 yesterday to image 41 today). No intraventricular hemorrhage is identified. No other subarachnoid blood identified. Stable gray-white matter differentiation throughout the brain. White matter hypodensity including deep white matter capsule involvement greater on the left. No midline shift, ventriculomegaly, mass effect, evidence of mass lesion, or evidence of cortically based acute infarction. Vascular: Calcified atherosclerosis at the skull base. No suspicious intracranial vascular hyperdensity. Skull: No acute osseous abnormality identified. Sinuses/Orbits: Stable, evidence of chronic right  maxillary sinusitis. Tympanic cavities and mastoids remain clear. Other: No acute orbit or scalp soft tissue finding. IMPRESSION: 1. Continued suspicion of trace subarachnoid hemorrhage in the posterior left sylvian fissure, which seems regressed from the non-contrast head CT 09/17/2019. This is probably sequelae of recent fall. 2. No new intracranial abnormality identified. Chronic small vessel disease. Electronically Signed   By: Genevie Ann M.D.   On: 09/19/2019 03:31   Ct Head Wo Contrast  Result Date: 09/17/2019 CLINICAL DATA:  Focal neuro deficit. Slurred speech and altered mental status since Thursday. EXAM: CT HEAD WITHOUT CONTRAST TECHNIQUE: Contiguous axial images were obtained from the base of the skull through the vertex without intravenous contrast. COMPARISON:  12/05/2017 MRI.  CT head dated 06/23/2017. FINDINGS: Brain: No evidence of acute infarction, hemorrhage, hydrocephalus, extra-axial collection or mass lesion/mass effect. Mild age related atrophy and chronic microvascular  ischemic changes are noted bilaterally. Vascular: No hyperdense vessel or unexpected calcification. Skull: Normal. Negative for fracture or focal lesion. 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 Sinuses/Orbits: No acute finding. Other: None. IMPRESSION: No acute intracranial abnormality. Electronically Signed   By: Constance Holster M.D.   On: 09/17/2019 15:52   Mr Angio Head Wo Contrast  Result Date: 09/18/2019 CLINICAL DATA:  Cerebral aneurysm, subarachnoid hemorrhage, cerebral vasospasm evaluation. EXAM: MRA HEAD WITHOUT CONTRAST TECHNIQUE: Angiographic images of the Circle of Willis were obtained using MRA technique without intravenous contrast. COMPARISON:  Brain MRI performed earlier the same day 09/18/2019, head CT 09/17/2019 FINDINGS: The examination is significantly motion degraded. This limits evaluation for stenoses and for small aneurysms. This also precludes adequate evaluation for vasospasm.  The intracranial internal carotid arteries are patent with atherosclerotic irregularity. Apparent moderate/severe stenosis within the cavernous left ICA may be accentuated by vessel tortuosity at this site. Apparent moderate focal stenosis within the cavernous right ICA. The right middle cerebral artery is patent. Mild/moderate focal stenosis within the distal M1 right MCA may reflect atherosclerotic narrowing or vasospasm. Motion degradation precludes adequate evaluation of the M2 right MCA branch vessels. The M1 left middle cerebral artery is patent without appreciable stenosis. Motion degradation precludes adequate evaluation of the M2 left MCA branch vessels. Motion degradation precludes adequate evaluation of the anterior cerebral arteries. No definite intracranial aneurysm is identified. Significant atherosclerotic irregularity of the non-dominant intracranial right vertebral artery which likely terminates predominantly as the right PICA. The dominant intracranial left vertebral artery is patent without significant stenosis, as is the basilar artery. Flow related signal is present within the proximal posterior cerebral arteries bilaterally. IMPRESSION: 1. Significantly motion degraded examination. This limits evaluation for stenoses and for small aneurysms. This also precludes adequate evaluation for vasospasm. 2. Atherosclerotic irregularity of the bilateral intracranial internal carotid arteries. Apparent moderate/severe stenosis within the cavernous left ICA may be accentuated by vessel tortuosity at this site. Apparent moderate focal stenosis within the cavernous right ICA. 3. Mild/moderate focal stenosis within the distal M1 right MCA, which may reflect atherosclerotic narrowing or vasospasm. 4. Motion degradation precludes adequate evaluation of the bilateral M2 MCA branch vessels and of the bilateral anterior cerebral arteries. 5. Significant atherosclerotic irregularity of the non dominant intracranial  right vertebral artery, which likely terminates predominantly as the right PICA. 6. No definite intracranial aneurysm is identified. Electronically Signed   By: Kellie Simmering DO   On: 09/18/2019 09:58   Mr Brain Wo Contrast  Result Date: 09/18/2019 CLINICAL DATA:  78 year old female with slurred speech and altered mental status. EXAM: MRI HEAD WITHOUT CONTRAST TECHNIQUE: Multiplanar, multiecho pulse sequences of the brain and surrounding structures were obtained without intravenous contrast. COMPARISON:  Brain MRI 12/05/2017.  Head CT earlier tonight. FINDINGS: Brain: There is trace abnormal T1 and FLAIR signal in the left sylvian fissure on series 11, image 13 and series 12, image 13 which corresponds to subtle hyperdensity on the earlier CT. This is new compared to the 2019 MRI. Regional flow voids on T2 imaging seem to remain stable. No diffusion restriction in the region. Only subtle associated T2* signal loss. No similar abnormal subarachnoid signal elsewhere. No intraventricular blood or debris. No restricted diffusion to suggest acute infarction. No midline shift, mass effect, evidence of mass lesion, ventriculomegaly. Pituitary normal limits. Motion artifact on sagittal T1 weighted imaging at the cervicomedullary junction and upper cervical spine. Chronic widely scattered and patchy bilateral cerebral white matter T2 and  FLAIR hyperintensity appears not significantly changed since 2019. No definite cortical encephalomalacia. No chronic cerebral blood products suspected. Vascular: Major intracranial vascular flow voids are stable. Dominant left vertebral artery. Skull and upper cervical spine: Visualized bone marrow signal is within normal limits. Cervical spine obscured by motion. Sinuses/Orbits: Stable, negative. Other: Mastoids remain clear. Scalp and face soft tissues appear negative. IMPRESSION: 1. Small volume abnormal T1 and FLAIR signal which seems to be in the posterior left sylvian fissure is  favored to correspond to Trace Subarachnoid Hemorrhage which was subtle on the head CT at 1537 hours yesterday. Although nonspecific similar small volume subarachnoid blood can be seen in the elderly after a fall. Consider a repeat non-contrast Head CT in the next several hours (coming up on 12 hours from the prior) for correlation. 2. No associated acute infarct or other acute intracranial abnormality identified. Electronically Signed   By: Genevie Ann M.D.   On: 09/18/2019 01:34   US Renal  Result Date: 09/17/2019 CLINICAL DATA:  Acute kidney injury EXAM: RENAL / URINARY TRACT ULTRASOUND COMPLETE COMPARISON:  03/08/2019 FINDINGS: Right Kidney: Renal measurements: 7.1 x 2.9 x 3.4 cm = volume: 36 mL. Severely atrophic with diffusely increased echotexture. 6 mm cyst. No hydronephrosis. Left Kidney: Renal measurements: 9.3 x 5.9 x 4.5 cm = volume: 130 mL. Small cysts, the largest 1.3 cm in the mid to upper pole. Diffuse increased echotexture. No hydronephrosis. Bladder: Decompressed, difficult to evaluate walls. Other: None. IMPRESSION: Diffusely increased echotexture compatible chronic medical renal disease. Severely atrophic right kidney. No hydronephrosis. Electronically Signed   By: Rolm Baptise M.D.   On: 09/17/2019 19:18   Dg Chest Portable 1 View  Result Date: 09/17/2019 CLINICAL DATA:  Altered mental status, slurred speech EXAM: PORTABLE CHEST 1 VIEW COMPARISON:  03/22/2019 chest radiograph. FINDINGS: Stable cardiomediastinal silhouette with normal heart size. No pneumothorax. No pleural effusion. Lungs appear clear, with no acute consolidative airspace disease and no pulmonary edema. IMPRESSION: No active disease. Electronically Signed   By: Ilona Sorrel M.D.   On: 09/17/2019 14:27        Scheduled Meds:  aspirin  81 mg Oral Daily   Chlorhexidine Gluconate Cloth  6 each Topical Daily   citalopram  20 mg Oral Daily   clopidogrel  75 mg Oral Daily   donepezil  5 mg Oral Daily    furosemide       isosorbide-hydrALAZINE  2 tablet Oral TID   mouth rinse  15 mL Mouth Rinse BID   metoprolol tartrate  50 mg Oral BID   pantoprazole  40 mg Oral Daily   rosuvastatin  40 mg Oral QHS   valproic acid  250 mg Oral BID   Continuous Infusions:  dextrose 100 mL/hr at 09/19/19 0931     LOS: 2 days    Time spent: 67mins    Kathie Dike, MD [facility]   If 7PM-7AM, please contact night-coverage [email or web address]  09/19/2019, 10:50 AM

## 2019-09-20 LAB — CBC
HCT: 20 % — ABNORMAL LOW (ref 36.0–46.0)
Hemoglobin: 7 g/dL — ABNORMAL LOW (ref 12.0–15.0)
MCH: 28.6 pg (ref 26.0–34.0)
MCHC: 35 g/dL (ref 30.0–36.0)
MCV: 81.6 fL (ref 80.0–100.0)
Platelets: 216 10*3/uL (ref 150–400)
RBC: 2.45 MIL/uL — ABNORMAL LOW (ref 3.87–5.11)
RDW: 15.8 % — ABNORMAL HIGH (ref 11.5–15.5)
WBC: 8.4 10*3/uL (ref 4.0–10.5)
nRBC: 0.6 % — ABNORMAL HIGH (ref 0.0–0.2)

## 2019-09-20 LAB — MAGNESIUM: Magnesium: 1.6 mg/dL — ABNORMAL LOW (ref 1.7–2.4)

## 2019-09-20 LAB — BASIC METABOLIC PANEL
Anion gap: 12 (ref 5–15)
BUN: 43 mg/dL — ABNORMAL HIGH (ref 8–23)
CO2: 19 mmol/L — ABNORMAL LOW (ref 22–32)
Calcium: 8 mg/dL — ABNORMAL LOW (ref 8.9–10.3)
Chloride: 104 mmol/L (ref 98–111)
Creatinine, Ser: 3.22 mg/dL — ABNORMAL HIGH (ref 0.44–1.00)
GFR calc Af Amer: 15 mL/min — ABNORMAL LOW (ref >60)
GFR calc non Af Amer: 13 mL/min — ABNORMAL LOW (ref >60)
Glucose, Bld: 109 mg/dL — ABNORMAL HIGH (ref 70–99)
Potassium: 3.9 mmol/L (ref 3.5–5.1)
Sodium: 135 mmol/L (ref 135–145)

## 2019-09-20 LAB — GLUCOSE, CAPILLARY
Glucose-Capillary: 123 mg/dL — ABNORMAL HIGH (ref 70–99)
Glucose-Capillary: 76 mg/dL (ref 70–99)
Glucose-Capillary: 107 mg/dL — ABNORMAL HIGH (ref 70–99)
Glucose-Capillary: 95 mg/dL (ref 70–99)

## 2019-09-20 LAB — HEMOGLOBIN AND HEMATOCRIT, BLOOD
HCT: 21.1 % — ABNORMAL LOW (ref 36.0–46.0)
Hemoglobin: 7.3 g/dL — ABNORMAL LOW (ref 12.0–15.0)

## 2019-09-20 MED ORDER — MAGNESIUM SULFATE 2 GM/50ML IV SOLN
2.0000 g | Freq: Once | INTRAVENOUS | Status: AC
  Administered 2019-09-20: 2 g via INTRAVENOUS
  Filled 2019-09-20: qty 50

## 2019-09-20 MED ORDER — ASPIRIN EC 81 MG PO TBEC
81.0000 mg | DELAYED_RELEASE_TABLET | Freq: Every day | ORAL | Status: DC
  Administered 2019-09-20 – 2019-09-21 (×2): 81 mg via ORAL
  Filled 2019-09-20 (×4): qty 1

## 2019-09-20 MED ORDER — LACTATED RINGERS IV SOLN
INTRAVENOUS | Status: DC
  Administered 2019-09-20 – 2019-09-21 (×4): via INTRAVENOUS

## 2019-09-20 NOTE — Progress Notes (Signed)
Pt. Became agitated shortly after arrival to the unit. Thinks she was moved to a completely different hospital, wants to leave. Not taking meds, doesn't want to eat. Sitter came to assist. Called pt.'s son Alyssa [name] who talked to patient ,said he was coming to the hospital. Sitter at bedside. Pt. Somewhat calm but still states that she wants to leave. Currently not making attempts to leave.

## 2019-09-20 NOTE — TOC Initial Note (Addendum)
Transition of Care [facility]) - Initial/Assessment Note    Patient Details  Name: Alyssa [name] MRN: [id number] Date of Birth: 1941/04/01  Transition of Care [facility]) CM/SW Contact:    Ella Bodo, RN Phone Number: 09/20/2019, 12:24 PM  Clinical Narrative:  78 year old female with a history of hypertension, chronic kidney disease stage IV, peripheral vascular disease, dementia, was brought to the hospital with poor p.o. intake, increasing lethargy, worsening generalized weakness.  She was noted to have acute kidney injury on chronic kidney disease, significant metabolic acidosis.  She had associated encephalopathy due to dehydration.  Imaging of the brain did not show any acute infarct, but did have incidental finding of trace subarachnoid hemorrhage. PTA, pt needs assistance with ADLS; lives with significant other and son.  Spoke with [name] by phone, who confirms that pt will have 24/7 assistance at discharge, as recommended by PT.  Recommend OT consult.  Will follow for HH/DME orders, per recommendations.                 Expected Discharge Plan: [place] Barriers to Discharge: Continued Medical Work up   Patient Goals and CMS Choice   CMS Medicare.gov Compare Post Acute Care list provided to:: Patient Represenative (must comment)(son)    Expected Discharge Plan and Services Expected Discharge Plan: [place]   Discharge Planning Services: CM Consult Post Acute Care Choice: [place] arrangements for the past 2 months: Single Family Home                                      Prior Living Arrangements/Services Living arrangements for the past 2 months: Single Family Home Lives with:: Significant Other, Adult Children Patient language and need for interpreter reviewed:: Yes Do you feel safe going back to the place where you live?: Yes      Need for Family Participation in Patient Care: Yes (Comment) Care giver support  system in place?: Yes (comment)   Criminal Activity/Legal Involvement Pertinent to Current Situation/Hospitalization: Yes - Comment as needed  Activities of Daily Living Home Assistive Devices/Equipment: Cane (specify quad or straight), Grab bars around toilet, Grab bars in shower, Wheelchair, Environmental consultant (specify type) ADL Screening (condition at time of admission) Patient's cognitive ability adequate to safely complete daily activities?: Yes Is the patient deaf or have difficulty hearing?: No Does the patient have difficulty seeing, even when wearing glasses/contacts?: No Does the patient have difficulty concentrating, remembering, or making decisions?: Yes Patient able to express need for assistance with ADLs?: Yes Does the patient have difficulty dressing or bathing?: Yes Independently performs ADLs?: Yes (appropriate for developmental age) Does the patient have difficulty walking or climbing stairs?: Yes Weakness of Legs: Both Weakness of Arms/Hands: None  Permission Sought/Granted                  Emotional Assessment Appearance:: Appears stated age Attitude/Demeanor/Rapport: Engaged Affect (typically observed): Accepting Orientation: : Oriented to Self, Oriented to Place, Oriented to Situation Alcohol / Substance Use: Not Applicable Psych Involvement: No (comment)  Admission diagnosis:  Uremia [N19] AKI (acute kidney injury) ([place]) [N17.9] Encephalopathy [G93.40] Patient Active Problem List   Diagnosis Date Noted  . Encephalopathy 09/18/2019  . SAH (subarachnoid hemorrhage) ([place]) 09/18/2019  . Dementia ([place]) 09/18/2019  . Metabolic acidosis 60/07/9322  . ARF (acute renal failure) ([place]) 09/17/2019  . Acute encephalopathy 09/17/2019  .  Abnormal CT scan, colon   . Diarrhea   . Diverticulitis 06/07/2019  . Depression with anxiety 06/07/2019  . DNR (do not resuscitate) 03/23/2019  . Volume overload 03/23/2019  . Edema 03/23/2019  . Acute renal failure (ARF) ([place])  03/08/2019  . Hypokalemia 03/08/2019  . Palliative care by specialist   . Goals of care, counseling/discussion   . Altered mental status   . Encephalopathy, toxic 12/24/2018  . Aspiration pneumonia of both lower lobes due to gastric secretions ([place]) 12/24/2018  . Non-ST elevation (NSTEMI) myocardial infarction ([place])   . Chest pain   . Dyspnea on exertion   . Elevated troponin level 12/09/2018  . Chronic migraine 03/27/2018  . Lumbar radiculopathy 03/27/2018  . Gait abnormality 03/27/2018  . CKD (chronic kidney disease) stage 4, GFR 15-29 ml/min (HCC) 12/28/2016  . Symptomatic anemia 12/28/2016  . Chronic pain syndrome 12/28/2016  . Benzodiazepine withdrawal without complication ([place]) 07/86/7544  . Chronic right shoulder pain 12/06/2016  . Fall 09/17/2016  . Fracture of humeral head, closed, right, initial encounter 09/17/2016  . Rib fractures 09/17/2016  . Multiple falls 09/17/2016  . Severe muscle deconditioning 09/17/2016  . Failure to thrive in adult 09/17/2016  . Melena 04/25/2016  . Essential hypertension 04/25/2016  . Anxiety state 04/25/2016  . Tobacco abuse 04/25/2016  . Hyperlipidemia 04/25/2016  . Syncope 04/24/2016  . Syncope and collapse 04/24/2016  . Gait difficulty 01/12/2016  . Foot drop, left 01/12/2016  . Severe recurrent major depression without psychotic features ([employer]) 08/28/2015  . Spondylolisthesis at L4-L5 level 07/30/2015  . PVD (peripheral vascular disease) with claudication ([place]) 07/29/2014  . Weakness-Bilateral arm/leg 07/29/2014  . Numbness-left leg 07/29/2014  . Swelling of limb-Legs 07/29/2014  . Atherosclerosis of native arteries of the extremities with intermittent claudication 09/30/2011   PCP:  Loretha Brasil, FNP Pharmacy:   [facility] [place], [place] [address] Phone: [phone] Fax: [place], [place] [address] [place] [address] Phone: [phone] Fax: [phone]     Social Determinants of Health (SDOH) Interventions    Reinaldo Raddle, RN, BSN  Trauma/Neuro ICU Case Manager [phone]

## 2019-09-20 NOTE — Progress Notes (Signed)
Report called to [facility], patient transferring to [phone].   Serrina [place]ogue, Tivis Ringer, RN

## 2019-09-20 NOTE — Progress Notes (Signed)
PROGRESS NOTE  Alyssa [name] KNL:[id number] DOB: 1941-01-29 DOA: 09/17/2019 PCP: Loretha Brasil, FNP  HPI/Recap of past 95 hours: 78 year old female with a history of hypertension, chronic kidney disease stage IV, peripheral vascular disease, dementia, was brought to the hospital with poor p.o. intake, increasing lethargy, worsening generalized weakness.  She was noted to have acute kidney injury on chronic kidney disease, significant metabolic acidosis.  She had associated encephalopathy due to dehydration.  Imaging of the brain did not show any acute infarct, but did have incidental finding of trace subarachnoid hemorrhage.  09/20/19: Patient was seen and examined at her bedside this morning.  No acute events overnight.  She has no new complaints this morning.  Renal function slowly improving.  Seen by PT OT with recommendation for SNF.  Family declines and request home health services.  Assessment/Plan: Principal Problem:   ARF (acute renal failure) (HCC) Active Problems:   Essential hypertension   Multiple falls   CKD (chronic kidney disease) stage 4, GFR 15-29 ml/min (HCC)   Acute encephalopathy   Encephalopathy   SAH (subarachnoid hemorrhage) (HCC)   Dementia (HCC)   Metabolic acidosis  AKI on CKD3 with baseline creatinine 1.7 and GFR of 34 likely prerenal in the setting of dehydration. Presented with creatinine of 4.86 with GFR of 9 Creatinine is trending down, 3.22 with GFR 15 on 09/20/19. Continue to avoid nephrotoxins Continue to monitor urine output Continue daily BMPs  Acute metabolic encephalopathy likely multifactorial secondary to acute illness, hypernatremia, and dehydration.   CT head was found to be unremarkable.  MRI scan did show small subarachnoid hemorrhage.  This was not felt to be contributing to her symptoms.  MRI brain was negative for acute infarct.  Valproic acid level checked and was not elevated.  Ammonia level normal.  TSH normal.  Patient  received IV hydration and had a substantial improvement in her mental status.    Resolved hypovolemia hypernatremia post IV fluid hydration Serum sodium 151 Received D5W Sodium 135 on 10/07/2019. DC D5W Start lactated Ringer at 75 cc/h Encourage oral fluid intake.  Non-anion gap metabolic acidosis likely secondary to worsening renal function Chemistry bicarb 19 and anion gap of 12 on 09/20/2019 Start isotonic bicarb p.o. 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 No sign of overt bleeding Continue to monitor H&H  Subarachnoid hemorrhage Neurosurgery reviewed imaging, no indication for surgical intervention SAH was not felt to be the cause of her encephalopathy.  Repeat CT scan done 12/2 shows regression of hemorrhage.  Will hold aspirin for another 24 hours (start on 12/3) and Plavix for 72 hours (start on 12/6).  History of peripheral vascular disease.  Will need to resume aspirin Plavix in the next 24 to 72 hours.  Continue Crestor 40 mg daily  Ambulatory dysfunction repeated falls PT OT assessed and recommended SNF.  Family declines.  Requests home health services.  Case manager assisting.  Continue PT OT with fall precautions  Neck pain.  Patient does describe significant neck pain with pain moving her neck to the left.  Since she did suffer a fall, will check CT of her C-spine 12/2>>No evidence of acute cervical spine fracture  or traumatic subluxation. Multilevel cervicothoracic spondylosis, similar to previous studies.  Resolved uncontrolled hypertension.   Blood pressure is at goal.  Continue home BiDil, metoprolol  Resolved sinus tachycardia.  Suspect there was some element  of reflex tachycardia since she has not  receiving metoprolol.    Resolved on beta-blocker.   DVT prophylaxis: SCDs Code Status: DNR Family Communication:  None at bedside.  Disposition Plan:  Possible discharge to home with home health services in the next 48 hours pending improvement of renal function and restarting dual antiplatelets.   Consultants:   Neurosurgery     Objective: Vitals:   09/20/19 0600 09/20/19 0700 09/20/19 0800 09/20/19 0900  BP: 120/62 131/80 121/76 125/78  Pulse: 89 74 77 83  Resp: 20 15 18 15   Temp:   98 F (36.7 C)   TempSrc:   Oral   SpO2: 95% 98% 97% 97%  Weight:      Height:        Intake/Output Summary (Last 24 hours) at 09/20/2019 1127 Last data filed at 09/20/2019 0900 Gross per 24 hour  Intake 2663.16 ml  Output -  Net 2663.16 ml   Filed Weights   09/19/19 0800  Weight: 40.3 kg    Exam:  . General: 78 y.o. year-old female well developed well nourished in no acute distress.  Alert and interactive. . Cardiovascular: Regular rate and rhythm with no rubs or gallops.  No thyromegaly or JVD noted.   Marland Kitchen Respiratory: Clear to auscultation with no wheezes or rales. Good inspiratory effort. . Abdomen: Soft nontender nondistended with normal bowel sounds x4 quadrants. . Musculoskeletal: Trace lower extremity edema. 2/4 pulses in all 4 extremities. Marland Kitchen Psychiatry: Mood is appropriate for condition and setting   Data Reviewed: CBC: Recent Labs  Lab 09/17/19 1408 09/17/19 2223 09/19/19 1056 09/20/19 0336  WBC 6.3 7.9 9.4 8.4  NEUTROABS 3.4  --   --   --   HGB 9.3* 10.0* 9.8* 7.0*  HCT 29.2* 32.5* 27.7* 20.0*  MCV 88.5 91.8 80.3 81.6  PLT 301 286 280 607   Basic Metabolic Panel: Recent Labs  Lab 09/17/19 1408 09/17/19 2223 09/18/19 1251 09/19/19 0632 09/20/19 0336  NA 144  --  148* 151* 135  K 4.3  --  4.0 3.2* 3.9  CL 124*  --  122* 115* 104  CO2 8*  --  10* 19* 19*  GLUCOSE 102*  --  121* 155* 109*  BUN 62*  --  59*  49* 43*  CREATININE 5.07* 4.86* 4.16* 3.49* 3.22*  CALCIUM 9.3  --  9.0 9.0 8.0*  MG  --   --   --   --  1.6*   GFR: Estimated Creatinine Clearance: 9.5 mL/min (A) (by C-G formula based on SCr of 3.22 mg/dL (H)). Liver Function Tests: Recent Labs  Lab 09/17/19 1408 09/19/19 0632  AST 15 18  ALT 9 8  ALKPHOS 112 104  BILITOT 0.5 0.2*  PROT 7.1 6.5  ALBUMIN 3.7 3.4*   No results for input(s): LIPASE, AMYLASE in the last 168 hours. Recent Labs  Lab 09/17/19 2223  AMMONIA 19   Coagulation Profile: Recent Labs  Lab 09/17/19 1408  INR 1.7*   Cardiac Enzymes: Recent Labs  Lab 09/17/19 2223  CKTOTAL 254*   BNP (last 3 results) No results for input(s): PROBNP in the last 8760 hours. HbA1C: Recent Labs    09/19/19 1331  HGBA1C 6.5*   CBG: Recent Labs  Lab 09/19/19 1220 09/19/19  1643 09/19/19 2127 09/20/19 0658 09/20/19 1108  GLUCAP 196* 123* 105* 123* 76   Lipid Profile: No results for input(s): CHOL, HDL, LDLCALC, TRIG, CHOLHDL, LDLDIRECT in the last 72 hours. Thyroid Function Tests: Recent Labs    09/17/19 2223  TSH 0.803   Anemia Panel: No results for input(s): VITAMINB12, FOLATE, FERRITIN, TIBC, IRON, RETICCTPCT in the last 72 hours. Urine analysis:    Component Value Date/Time   COLORURINE YELLOW 09/17/2019 1556   APPEARANCEUR CLEAR 09/17/2019 1556   LABSPEC 1.012 09/17/2019 1556   PHURINE 6.0 09/17/2019 1556   GLUCOSEU NEGATIVE 09/17/2019 1556   HGBUR NEGATIVE 09/17/2019 1556   BILIRUBINUR NEGATIVE 09/17/2019 1556   [place] 09/17/2019 1556   PROTEINUR 100 (A) 09/17/2019 1556   UROBILINOGEN 0.2 08/24/2015 1737   NITRITE NEGATIVE 09/17/2019 1556   LEUKOCYTESUR NEGATIVE 09/17/2019 1556   Sepsis Labs: @LABRCNTIP (procalcitonin:4,lacticidven:4)  ) Recent Results (from the past 240 hour(s))  SARS CORONAVIRUS 2 (TAT 6-24 HRS) Nasopharyngeal Nasopharyngeal Swab     Status: None   Collection Time: 09/17/19  3:56 PM   Specimen:  Nasopharyngeal Swab  Result Value Ref Range Status   SARS Coronavirus 2 NEGATIVE NEGATIVE Final    Comment: (NOTE) SARS-CoV-2 target nucleic acids are NOT DETECTED. The SARS-CoV-2 RNA is generally detectable in upper and lower respiratory specimens during the acute phase of infection. Negative results do not preclude SARS-CoV-2 infection, do not rule out co-infections with other pathogens, and should not be used as the sole basis for treatment or other patient management decisions. Negative results must be combined with clinical observations, patient history, and epidemiological information. The expected result is Negative. Fact Sheet for Patients: SugarRoll.be Fact Sheet for Healthcare Providers: https://www.woods-mathews.com/ This test is not yet approved or cleared by the [place] FDA and  has been authorized for detection and/or diagnosis of SARS-CoV-2 by FDA under an Emergency Use Authorization (EUA). This EUA will remain  in effect (meaning this test can be used) for the duration of the COVID-19 declaration under Section 56 4(b)(1) of the Act, 21 U.S.C. section 360bbb-3(b)(1), unless the authorization is terminated or revoked sooner. Performed at [facility], [place] [address], [place], [place] [address]   MRSA PCR Screening     Status: None   Collection Time: 09/18/19  8:10 PM   Specimen: Nasal Mucosa; Nasopharyngeal  Result Value Ref Range Status   MRSA by PCR NEGATIVE NEGATIVE Final    Comment:        The GeneXpert MRSA Assay (FDA approved for NASAL specimens only), is one component of a comprehensive MRSA colonization surveillance program. It is not intended to diagnose MRSA infection nor to guide or monitor treatment for MRSA infections. Performed at [facility], [place] [address], [place], [place] [address]       Studies: Ct Cervical Spine Wo Contrast  Result Date: 09/19/2019 CLINICAL DATA:   Significant neck pain with movement.  Recent fall. EXAM: CT CERVICAL SPINE WITHOUT CONTRAST TECHNIQUE: Multidetector CT imaging of the cervical spine was performed without intravenous contrast. Multiplanar CT image reconstructions were also generated. COMPARISON:  CT cervical spine 12/2018. FINDINGS: Alignment: Stable mild anterolisthesis at C4-5 and C5-6 secondary to facet disease. Skull base and vertebrae: No evidence of acute cervical spine fracture or traumatic subluxation. Probable chronic interbody and left interfacetal fusion at C6-7. Prominent degenerative endplate sclerosis at O8-C1, T1-2, T2-3 and T4-5. The posterior arch of C1 is incomplete. Soft tissues and spinal canal: No prevertebral fluid or  swelling. No visible canal hematoma. Disc levels: Multilevel spondylosis with posterior osteophytes and facet hypertrophy, similar to previous CT. No large disc herniation identified. Upper chest: Atherosclerosis of the aorta and great vessels. 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 Chronic periosteal thickening of the 1st ribs bilaterally. Other: None. IMPRESSION: 1. No evidence of acute cervical spine fracture or traumatic subluxation. 2. Multilevel cervicothoracic spondylosis, similar to previous studies. Electronically Signed   By: Richardean Sale M.D.   On: 09/19/2019 16:19    Scheduled Meds: . Chlorhexidine Gluconate Cloth  6 each Topical Daily  . citalopram  20 mg Oral Daily  . donepezil  5 mg Oral Daily  . insulin aspart  0-15 Units Subcutaneous TID WC  . insulin aspart  0-5 Units Subcutaneous QHS  . isosorbide-hydrALAZINE  2 tablet Oral TID  . mouth rinse  15 mL Mouth Rinse BID  . metoprolol tartrate  50 mg Oral BID  . pantoprazole  40 mg Oral Daily  . rosuvastatin  40 mg Oral QHS  . valproic acid  250 mg Oral BID    Continuous Infusions: . dextrose Stopped (09/20/19 0842)     LOS: 3 days     Kayleen Memos, MD [facility] Pager [phone]  If 7PM-7AM,  please contact night-coverage [email or web address] Password [email or web address] 09/20/2019, 11:27 AM

## 2019-09-21 LAB — CBC
HCT: 20.6 % — ABNORMAL LOW (ref 36.0–46.0)
Hemoglobin: 7.1 g/dL — ABNORMAL LOW (ref 12.0–15.0)
MCH: 28.3 pg (ref 26.0–34.0)
MCHC: 34.5 g/dL (ref 30.0–36.0)
MCV: 82.1 fL (ref 80.0–100.0)
Platelets: 242 10*3/uL (ref 150–400)
RBC: 2.51 MIL/uL — ABNORMAL LOW (ref 3.87–5.11)
RDW: 15.5 % (ref 11.5–15.5)
WBC: 8.8 10*3/uL (ref 4.0–10.5)
nRBC: 0.2 % (ref 0.0–0.2)

## 2019-09-21 LAB — GLUCOSE, CAPILLARY
Glucose-Capillary: 115 mg/dL — ABNORMAL HIGH (ref 70–99)
Glucose-Capillary: 133 mg/dL — ABNORMAL HIGH (ref 70–99)
Glucose-Capillary: 72 mg/dL (ref 70–99)
Glucose-Capillary: 87 mg/dL (ref 70–99)

## 2019-09-21 LAB — PREPARE RBC (CROSSMATCH)

## 2019-09-21 LAB — BASIC METABOLIC PANEL
Anion gap: 10 (ref 5–15)
BUN: 44 mg/dL — ABNORMAL HIGH (ref 8–23)
CO2: 18 mmol/L — ABNORMAL LOW (ref 22–32)
Calcium: 8.6 mg/dL — ABNORMAL LOW (ref 8.9–10.3)
Chloride: 105 mmol/L (ref 98–111)
Creatinine, Ser: 3.28 mg/dL — ABNORMAL HIGH (ref 0.44–1.00)
GFR calc Af Amer: 15 mL/min — ABNORMAL LOW (ref >60)
GFR calc non Af Amer: 13 mL/min — ABNORMAL LOW (ref >60)
Glucose, Bld: 105 mg/dL — ABNORMAL HIGH (ref 70–99)
Potassium: 3.8 mmol/L (ref 3.5–5.1)
Sodium: 133 mmol/L — ABNORMAL LOW (ref 135–145)

## 2019-09-21 MED ORDER — SODIUM BICARBONATE 650 MG PO TABS
1300.0000 mg | ORAL_TABLET | Freq: Two times a day (BID) | ORAL | Status: DC
  Administered 2019-09-21 – 2019-09-22 (×3): 1300 mg via ORAL
  Filled 2019-09-21 (×3): qty 2

## 2019-09-21 MED ORDER — SODIUM CHLORIDE 0.9% IV SOLUTION
Freq: Once | INTRAVENOUS | Status: AC
  Administered 2019-09-21: 10:00:00 via INTRAVENOUS

## 2019-09-21 NOTE — TOC Progression Note (Signed)
Transition of Care [facility]) - Progression Note    Patient Details  Name: Alyssa [name] MRN: [id number] Date of Birth: 30-Apr-1941  [facility] CM/SW Contact  Pollie Friar, RN Phone Number: 09/21/2019, 1:17 PM  Clinical Narrative:    CM called and spoke to patients son. CM provided choice for [facility] services and [facility]. [facility] with [facility] accepted the referral.  MD please place [place] orders prior to d/c.  TOC following for further d/c needs.    Expected Discharge Plan: [place] Barriers to Discharge: Continued Medical Work up  Expected Discharge Plan and Services Expected Discharge Plan: [place]   Discharge Planning Services: CM Consult Post Acute Care Choice: [place] arrangements for the past 2 months: Single Family Home                                       Social Determinants of Health (SDOH) Interventions    Readmission Risk Interventions Readmission Risk Prevention Plan 12/26/2018  Transportation Screening Complete  Medication Review (RN Care Manager) Complete  PCP or Specialist appointment within 3-5 days of discharge Not Complete  PCP/Specialist Appt Not Complete comments pt for snf. Snf MD to see as needed.  [place] or Home Care Consult Not Complete  HRI or Home Care Consult Pt Refusal Comments NA  SW Recovery Care/Counseling Consult Not Complete  SW Consult Not Complete Comments NA  Palliative Care Screening Complete  Skilled Nursing Facility Complete  Some recent data might be hidden

## 2019-09-21 NOTE — Progress Notes (Signed)
Physical Therapy Treatment Patient Details Name: Alyssa [name] MRN: [id number] DOB: Jun 08, 1941 Today's Date: 09/21/2019    History of Present Illness 78 year old female with a history of hypertension, chronic kidney disease stage IV, peripheral vascular disease, dementia, was brought to the hospital with poor p.o. intake, increasing lethargy, worsening generalized weakness.  She was noted to have acute kidney injury on chronic kidney disease, significant metabolic acidosis.  She had associated encephalopathy due to dehydration.  Imaging of the brain did not show any acute infarct, but did have incidental finding of trace subarachnoid hemorrhage.    PT Comments    Pt currently requires supervision for bed mobility with use of hospital bed features but up to mod assist for gait with IV pole 20 ft x 2. 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 It is likely that pt's balance during functional mobility would improve with use of custom shoes. Pt does appear to have some cognitive deficits and will require 24 hr supervision at d/c. Will continue to follow pt acutely to focus on increasing independence with functional mobility & to assist with d/c planning.  Pt with low Hemoglobin but RN cleared pt for participation in therapy, reporting pt will receive blood transfusion today. Pt asymptomatic with no c/o dizziness or lightheadedness throughout session. Supine: BP = 128/65 mmHg, HR = 90 bpm After ambulating first 20 ft while sitting EOB: BP = 148/87 mmHg, HR = 94    Follow Up Recommendations  Home health PT;Supervision/Assistance - 24 hour;Other (comment)(SNF if family cannot provide 24 hr supervision)     Equipment Recommendations  None recommended by PT    Recommendations for Other Services       Precautions / Restrictions  Precautions Precautions: Fall Precaution Comments: significant leg length discrepency with RLE shorter than L (2/2 old injury; pt has custom shoes but not currently with her in hospital) Restrictions Weight Bearing Restrictions: No    Mobility  Bed Mobility Overal bed mobility: Needs Assistance Bed Mobility: Sit to Supine;Supine to Sit     Supine to sit: Supervision;HOB elevated(bed rails) Sit to supine: Supervision;HOB elevated(bed rails)   General bed mobility comments: Pt requires extra time to complete bed mobility  Transfers Overall transfer level: Needs assistance Equipment used: (IV pole) Transfers: Sit to/from Stand Sit to Stand: Min assist            Ambulation/Gait Ambulation/Gait assistance: Mod assist Gait Distance (Feet): (20 ft + 20 ft) Assistive device: IV Pole   Gait velocity: decreased   General Gait Details: Pt with significant RLE leg length discrepency with RLE much shorter than L. Pt reports she has custom shoes but they are not currently with her in the hospital. Pt with impaired balance during gait, very likely due to LLD & pt walking on RLE tiptoes   Stairs             Wheelchair Mobility    Modified Rankin (Stroke Patients Only)       Balance Overall balance assessment: Needs assistance Sitting-balance support: Feet unsupported;No upper extremity supported Sitting balance-Leahy Scale: Fair Sitting balance - Comments: close supervision with static sitting balance EOB   Standing balance support: Single extremity supported;No upper extremity supported Standing balance-Leahy Scale: Poor Standing balance comment: mod assist & UE support on IV pole for gait in room  Cognition Arousal/Alertness: Awake/alert Behavior During Therapy: WFL for tasks assessed/performed Overall Cognitive Status: Difficult to assess                                 General Comments: Pt answered phone &  telling her boyfriend "they have me here because I'm crazy" with pt repeating this to therapist too      Exercises      General Comments        Pertinent Vitals/Pain Pain Assessment: No/denies pain    Home Living                      Prior Function            PT Goals (current goals can now be found in the care plan section) Acute Rehab PT Goals Patient Stated Goal: none stated during session PT Goal Formulation: With patient Time For Goal Achievement: 10/03/19 Potential to Achieve Goals: Good Progress towards PT goals: Progressing toward goals    Frequency    Min 3X/week      PT Plan Current plan remains appropriate    Co-evaluation              AM-PAC PT "6 Clicks" Mobility   Outcome Measure  Help needed turning from your back to your side while in a flat bed without using bedrails?: A Little Help needed moving from lying on your back to sitting on the side of a flat bed without using bedrails?: A Little Help needed moving to and from a bed to a chair (including a wheelchair)?: A Little Help needed standing up from a chair using your arms (e.g., wheelchair or bedside chair)?: A Little Help needed to walk in hospital room?: A Lot Help needed climbing 3-5 steps with a railing? : A Lot 6 Click Score: 16    End of Session Equipment Utilized During Treatment: Gait belt Activity Tolerance: Patient tolerated treatment well Patient left: in bed;with bed alarm set;with nursing/sitter in room;with call bell/phone within reach Nurse Communication: Mobility status PT Visit Diagnosis: Other abnormalities of gait and mobility (R26.89);Pain;Muscle weakness (generalized) (M62.81)     Time: 6283-6629 PT Time Calculation (min) (ACUTE ONLY): 19 min  Charges:  $Therapeutic Activity: 8-22 mins                         Waunita Schooner, PT, DPT 09/21/2019, 11:17 AM

## 2019-09-21 NOTE — Progress Notes (Signed)
PROGRESS NOTE  EMNET MONK [facility]:[id number] DOB: [birth year] DOA: 09/17/2019 PCP: Loretha Brasil, FNP  HPI/Recap of past 18 hours: 78 year old female with a history of hypertension, chronic kidney disease stage IV, peripheral vascular disease, dementia, was brought to the hospital with poor p.o. intake, increasing lethargy, worsening generalized weakness.  She was noted to have acute kidney injury on chronic kidney disease, significant metabolic acidosis.  She had associated encephalopathy due to dehydration.  Imaging of the brain did not show any acute infarct, but did have incidental finding of trace subarachnoid hemorrhage.  09/21/19: Patient was seen and examined at her bedside this morning.  She is alert and oriented x3.  Acute drop in hemoglobin this morning.  She denies any abdominal pain,  Nausea, blood in her stool or melena. No sign of overt bleeding. 1U PRBC to be transfused.     Assessment/Plan: Principal Problem:   ARF (acute renal failure) (HCC) Active Problems:   Essential hypertension   Multiple falls   CKD (chronic kidney disease) stage 4, GFR 15-29 ml/min (HCC)   Acute encephalopathy   Encephalopathy   SAH (subarachnoid hemorrhage) (HCC)   Dementia (HCC)   Metabolic acidosis  AKI on CKD3 with baseline creatinine 1.7 and GFR of 34 likely prerenal in the setting of dehydration. Presented with creatinine of 4.86 with GFR of 9 Creatinine slightly trended up this am 3.28 from 3.22  Continue to avoid nephrotoxins No UO recorded Will add strict I&O C/w LR Continue daily BMPs  Resolved Acute metabolic encephalopathy likely multifactorial secondary to acute illness, hypernatremia, and dehydration.   CT head was found to be unremarkable.  MRI scan did show small subarachnoid hemorrhage.  This was not felt to be contributing to her symptoms.  MRI brain was negative for acute infarct.  Valproic acid level checked and was not elevated.  Ammonia level normal.  TSH  normal.  Patient received IV hydration and had a substantial improvement in her mental status.   She is alert and oriented x3.  Answers all questions appropriately.  Resolved hypovolemia hypernatremia post IV fluid hydration Presented with Serum sodium 151>>133 Received D5W On lactated Ringer at 75 cc/h for AKI Encourage oral fluid intake.  Non-anion gap metabolic acidosis likely secondary to worsening renal function Chemistry bicarb 19 and anion gap of 12 on 09/20/2019 Na+ bicarb p.o. 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 No sign of overt bleeding Continue to monitor H&H  Subarachnoid hemorrhage Neurosurgery reviewed imaging, no indication for surgical intervention SAH was not felt to be the cause of her encephalopathy.  Repeat CT scan done 12/2 shows regression of hemorrhage.   Restarted ASA on 09/20/19 Restart Plavix on 09/23/19  History of peripheral vascular disease.  C/w antiplatelets Continue Crestor 40 mg daily  Ambulatory dysfunction repeated falls PT OT assessed and recommended SNF.  Family declines.  Requests home health services.  Case manager assisting.   If family cannot provide 24h assistance/supervision may need SNF Continue PT OT with assistance and fall precautions  Resolved Neck pain.   CT of her C-spine 12/2>>No evidence of acute cervical spine fracture or traumatic subluxation. Multilevel cervicothoracic  spondylosis, similar to previous studies.  Resolved uncontrolled hypertension.   Blood pressure is at goal.  Continue home BiDil, metoprolol  Resolved sinus tachycardia.  Suspect there was some element of reflex tachycardia since she  has not  receiving metoprolol.    Resolved on beta-blocker.   DVT prophylaxis: SCDs Code Status: DNR Family Communication:  None at bedside.  Disposition Plan:  Possible discharge in the next 48-72H pending stability of H&H and improvement of renal function.  Consultants:   Neurosurgery     Objective: Vitals:   09/21/19 1045 09/21/19 1257 09/21/19 1310 09/21/19 1609  BP: 118/77 106/73 (!) 131/93 126/78  Pulse: 89 79 77 74  Resp: 18 20 18 17   Temp: 98.9 F (37.2 C) 98.1 F (36.7 C) 99 F (37.2 C) 98.4 F (36.9 C)  TempSrc: Oral Oral Oral Oral  SpO2: 99% 100% 100% 100%  Weight:      Height:        Intake/Output Summary (Last 24 hours) at 09/21/2019 1648 Last data filed at 09/21/2019 1500 Gross per 24 hour  Intake 1546.08 ml  Output -  Net 1546.08 ml   Filed Weights   09/19/19 0800 09/21/19 0309 09/21/19 0418  Weight: 40.3 kg 39.3 kg 44.3 kg    Exam:  . General: 78 y.o. year-old female well-developed well-nourished in no acute distress.  Alert and oriented x3.   . Cardiovascular: Regular rate and rhythm no rubs or gallops. Marland Kitchen Respiratory: Clear to auscultation no wheezes no rales.   . Abdomen: Nontender nondistended bowel sounds present.  Musculoskeletal: Trace lower extremity edema.   Marland Kitchen Psychiatry: Mood is appropriate for condition and setting.   Data Reviewed: CBC: Recent Labs  Lab 09/17/19 1408 09/17/19 2223 09/19/19 1056 09/20/19 0336 09/20/19 1233 09/21/19 0241  WBC 6.3 7.9 9.4 8.4  --  8.8  NEUTROABS 3.4  --   --   --   --   --   HGB 9.3* 10.0* 9.8* 7.0* 7.3* 7.1*  HCT 29.2* 32.5* 27.7* 20.0* 21.1* 20.6*  MCV 88.5 91.8 80.3 81.6  --  82.1  PLT 301 286 280 216  --  536   Basic Metabolic Panel: Recent Labs  Lab 09/17/19 1408 09/17/19 2223 09/18/19 1251 09/19/19 0632 09/20/19 0336 09/21/19 0606  NA 144  --  148* 151* 135 133*  K 4.3  --  4.0 3.2* 3.9 3.8  CL 124*  --  122* 115* 104 105  CO2 8*  --  10* 19* 19* 18*  GLUCOSE 102*  --   121* 155* 109* 105*  BUN 62*  --  59* 49* 43* 44*  CREATININE 5.07* 4.86* 4.16* 3.49* 3.22* 3.28*  CALCIUM 9.3  --  9.0 9.0 8.0* 8.6*  MG  --   --   --   --  1.6*  --    GFR: Estimated Creatinine Clearance: 9.4 mL/min (A) (by C-G formula based on SCr of 3.28 mg/dL (H)). Liver Function Tests: Recent Labs  Lab 09/17/19 1408 09/19/19 0632  AST 15 18  ALT 9 8  ALKPHOS 112 104  BILITOT 0.5 0.2*  PROT 7.1 6.5  ALBUMIN 3.7 3.4*   No results for input(s): LIPASE, AMYLASE in the last 168 hours. Recent Labs  Lab 09/17/19 2223  AMMONIA 19   Coagulation Profile: Recent Labs  Lab 09/17/19 1408  INR 1.7*   Cardiac Enzymes: Recent Labs  Lab 09/17/19 2223  CKTOTAL 254*   BNP (last 3 results) No results for input(s): PROBNP  in the last 8760 hours. HbA1C: Recent Labs    09/19/19 1331  HGBA1C 6.5*   CBG: Recent Labs  Lab 09/20/19 1531 09/20/19 2108 09/21/19 0602 09/21/19 1300 09/21/19 1612  GLUCAP 107* 95 115* 133* 72   Lipid Profile: No results for input(s): CHOL, HDL, LDLCALC, TRIG, CHOLHDL, LDLDIRECT in the last 72 hours. Thyroid Function Tests: No results for input(s): TSH, T4TOTAL, FREET4, T3FREE, THYROIDAB in the last 72 hours. Anemia Panel: No results for input(s): VITAMINB12, FOLATE, FERRITIN, TIBC, IRON, RETICCTPCT in the last 72 hours. Urine analysis:    Component Value Date/Time   COLORURINE YELLOW 09/17/2019 1556   APPEARANCEUR CLEAR 09/17/2019 1556   LABSPEC 1.012 09/17/2019 1556   PHURINE 6.0 09/17/2019 1556   GLUCOSEU NEGATIVE 09/17/2019 1556   HGBUR NEGATIVE 09/17/2019 1556   BILIRUBINUR NEGATIVE 09/17/2019 1556   [place] 09/17/2019 1556   PROTEINUR 100 (A) 09/17/2019 1556   UROBILINOGEN 0.2 08/24/2015 1737   NITRITE NEGATIVE 09/17/2019 1556   LEUKOCYTESUR NEGATIVE 09/17/2019 1556   Sepsis Labs: @LABRCNTIP (procalcitonin:4,lacticidven:4)  ) Recent Results (from the past 240 hour(s))  SARS CORONAVIRUS 2 (TAT 6-24 HRS)  Nasopharyngeal Nasopharyngeal Swab     Status: None   Collection Time: 09/17/19  3:56 PM   Specimen: Nasopharyngeal Swab  Result Value Ref Range Status   SARS Coronavirus 2 NEGATIVE NEGATIVE Final    Comment: (NOTE) SARS-CoV-2 target nucleic acids are NOT DETECTED. The SARS-CoV-2 RNA is generally detectable in upper and lower respiratory specimens during the acute phase of infection. Negative results do not preclude SARS-CoV-2 infection, do not rule out co-infections with other pathogens, and should not be used as the sole basis for treatment or other patient management decisions. Negative results must be combined with clinical observations, patient history, and epidemiological information. The expected result is Negative. Fact Sheet for Patients: SugarRoll.be Fact Sheet for Healthcare Providers: https://www.woods-mathews.com/ This test is not yet approved or cleared by the [place] FDA and  has been authorized for detection and/or diagnosis of SARS-CoV-2 by FDA under an Emergency Use Authorization (EUA). This EUA will remain  in effect (meaning this test can be used) for the duration of the COVID-19 declaration under Section 56 4(b)(1) of the Act, 21 U.S.C. section 360bbb-3(b)(1), unless the authorization is terminated or revoked sooner. Performed at [facility], [place] [address] [place], [place] [address]   MRSA PCR Screening     Status: None   Collection Time: 09/18/19  8:10 PM   Specimen: Nasal Mucosa; Nasopharyngeal  Result Value Ref Range Status   MRSA by PCR NEGATIVE NEGATIVE Final    Comment:        The GeneXpert MRSA Assay (FDA approved for NASAL specimens only), is one component of a comprehensive MRSA colonization surveillance program. It is not intended to diagnose MRSA infection nor to guide or monitor treatment for MRSA infections. Performed at [facility], [place] [address], [place], [place] [address]        Studies: No results found.  Scheduled Meds: . aspirin EC  81 mg Oral QHS  . Chlorhexidine Gluconate Cloth  6 each Topical Daily  . citalopram  20 mg Oral Daily  . donepezil  5 mg Oral Daily  . insulin aspart  0-15 Units Subcutaneous TID WC  . insulin aspart  0-5 Units Subcutaneous QHS  . isosorbide-hydrALAZINE  2 tablet Oral TID  . mouth rinse  15 mL Mouth Rinse BID  . metoprolol tartrate  50 mg Oral BID  .  pantoprazole  40 mg Oral Daily  . rosuvastatin  40 mg Oral QHS  . sodium bicarbonate  1,300 mg Oral BID WC  . valproic acid  250 mg Oral BID    Continuous Infusions: . lactated ringers 75 mL/hr at 09/21/19 1621     LOS: 4 days     Kayleen Memos, MD [facility] Pager [phone]  If 7PM-7AM, please contact night-coverage [email or web address] Password [facility] 09/21/2019, 4:48 PM

## 2019-09-21 NOTE — Evaluation (Signed)
Occupational Therapy Evaluation Patient Details Name: Alyssa [name] MRN: [id number] DOB: 05-01-1941 Today's Date: 09/21/2019    History of Present Illness 78 year old female with a history of hypertension, chronic kidney disease stage IV, peripheral vascular disease, dementia, was brought to the hospital with poor p.o. intake, increasing lethargy, worsening generalized weakness.  She was noted to have acute kidney injury on chronic kidney disease, significant metabolic acidosis.  She had associated encephalopathy due to dehydration.  Imaging of the brain did not show any acute infarct, but did have incidental finding of trace subarachnoid hemorrhage.   Clinical Impression   Pt admitted with above and presents to OT with impairments impacting ability to complete ADLs at [facility].  PTA pt with leg length discrepancy and modified shoe to correct leg length, however not with her in hospital.  Pt completed functional mobility in room, ambulating to toilet with RW with min-mod assist largely due to leg length discrepancy.  Pt required assistance with toileting hygiene due to LUE weakness and pain in shoulder. Min guard for standing balance when standing at sink and cues to incorporate LUE when reaching for items on sink.  Pt will continue to benefit from OT acutely to increase independence with ADLs and functional mobility prior to d/c home.  Recommend 24/7 supervision as home and [place], if family unable to provide 24/7 she may need SNF.    Follow Up Recommendations  Home health OT;Supervision/Assistance - 24 hour    Equipment Recommendations  None recommended by OT       Precautions / Restrictions Precautions Precautions: Fall Precaution Comments: significant leg length discrepency with RLE shorter than L (2/2 old injury; pt has custom shoes but not currently with her in hospital) Restrictions Weight Bearing Restrictions: No      Mobility Bed Mobility Overal bed mobility: Needs  Assistance Bed Mobility: Sit to Supine;Supine to Sit     Supine to sit: Supervision;HOB elevated Sit to supine: Supervision;HOB elevated      Transfers Overall transfer level: Needs assistance Equipment used: Rolling walker (2 wheeled) Transfers: Sit to/from Stand Sit to Stand: Min assist                  ADL either performed or assessed with clinical judgement   ADL Overall ADL's : Needs assistance/impaired     Grooming: Wash/dry hands;Wash/dry face;Standing;Min guard Grooming Details (indicate cue type and reason): cues to incorporate use of LUE when reaching for paper towels Upper Body Bathing: Min guard;Sitting   Lower Body Bathing: Minimal assistance;Sit to/from stand           Toilet Transfer: Moderate assistance;Ambulation;RW Toilet Transfer Details (indicate cue type and reason): Min-mod assist due to leg length discrepancy, pt would most likely be min guard to supervision if she had person (adapted) shoes Toileting- Clothing Manipulation and Hygiene: Minimal assistance;Sit to/from stand       Functional mobility during ADLs: Minimal assistance;Moderate assistance;Rolling walker       Vision Baseline Vision/History: No visual deficits Patient Visual Report: No change from baseline Vision Assessment?: No apparent visual deficits            Pertinent Vitals/Pain Pain Assessment: No/denies pain     Hand Dominance Right   Extremity/Trunk Assessment Upper Extremity Assessment Upper Extremity Assessment: Generalized weakness;LUE deficits/detail LUE Deficits / Details: weak at the shoulder due to new onset pain after fall per pt. LUE: Unable to fully assess due to pain(in shoulder) LUE Sensation: WNL   Lower Extremity Assessment Lower Extremity  Assessment: Generalized weakness       Communication Communication Communication: HOH   Cognition Arousal/Alertness: Awake/alert Behavior During Therapy: WFL for tasks assessed/performed Overall  Cognitive Status: Difficult to assess                                 General Comments: Pt stating she is in "jail" and that she had a fight with her son who had her admitted bc "they have me here because I'm crazy"  and that he is mad at her              [place] expects to be discharged to:: Private residence   Available Help at Discharge: Family;Available PRN/intermittently(son close by and sister lives next door) Type of Home: House Home Access: Stairs to enter [employer] of Steps: 3 Entrance Stairs-Rails: Right;Left Home Layout: One level     Bathroom Shower/Tub: Teacher, early years/pre: Standard     Home Equipment: Environmental consultant - 2 wheels;Walker - 4 wheels;Bedside commode;Shower seat          Prior Functioning/Environment Level of Independence: Needs assistance  Gait / Transfers Assistance Needed: pt ambulates with a RW or cane per pt ADL's / Homemaking Assistance Needed: Does her own ADLs per report. Sister drives and does the IADLS.            OT Problem List: Decreased range of motion;Decreased activity tolerance;Impaired balance (sitting and/or standing);Pain      OT Treatment/Interventions: Self-care/ADL training;Energy conservation;DME and/or AE instruction;Therapeutic activities;Patient/family education;Balance training    OT Goals(Current goals can be found in the care plan section) Acute Rehab OT Goals Patient Stated Goal: none stated OT Goal Formulation: With patient Time For Goal Achievement: 10/05/19 Potential to Achieve Goals: Good  OT Frequency: Min 2X/week   Barriers to D/C: Decreased caregiver support             AM-PAC OT "6 Clicks" Daily Activity     Outcome Measure Help from another person eating meals?: A Little Help from another person taking care of personal grooming?: A Little Help from another person toileting, which includes using toliet, bedpan, or urinal?: A Little Help from  another person bathing (including washing, rinsing, drying)?: A Little Help from another person to put on and taking off regular upper body clothing?: A Little Help from another person to put on and taking off regular lower body clothing?: A Little 6 Click Score: 18   End of Session Equipment Utilized During Treatment: Gait belt;Rolling walker Nurse Communication: Mobility status  Activity Tolerance: Patient tolerated treatment well Patient left: in bed;with call bell/phone within reach;with bed alarm set  OT Visit Diagnosis: Other abnormalities of gait and mobility (R26.89);Muscle weakness (generalized) (M62.81);Pain Pain - Right/Left: Left Pain - part of body: Shoulder                Time: 1440-1457 OT Time Calculation (min): 17 min Charges:  OT General Charges $OT Visit: 1 Visit OT Evaluation $OT Eval Moderate Complexity: [place], [place] 09/21/2019, 3:46 PM

## 2019-09-21 NOTE — Progress Notes (Signed)
[place] informed of pt's Hgb 7.1

## 2019-09-21 NOTE — Progress Notes (Signed)
Pt refuse 0000 vs attempted by NT and RN.

## 2019-09-21 NOTE — Plan of Care (Signed)
  Problem: Education: Goal: Knowledge of disease or condition will improve Outcome: Progressing Goal: Knowledge of secondary prevention will improve Outcome: Progressing Goal: Knowledge of patient specific risk factors addressed and post discharge goals established will improve Outcome: Progressing Goal: Individualized Educational Video(s) Outcome: Progressing   Ival Bible, BSN, RN

## 2019-09-22 DIAGNOSIS — R269 Unspecified abnormalities of gait and mobility: Secondary | ICD-10-CM

## 2019-09-22 DIAGNOSIS — F0280 Dementia in other diseases classified elsewhere without behavioral disturbance: Secondary | ICD-10-CM

## 2019-09-22 LAB — BASIC METABOLIC PANEL
Anion gap: 10 (ref 5–15)
BUN: 47 mg/dL — ABNORMAL HIGH (ref 8–23)
CO2: 20 mmol/L — ABNORMAL LOW (ref 22–32)
Calcium: 8.1 mg/dL — ABNORMAL LOW (ref 8.9–10.3)
Chloride: 104 mmol/L (ref 98–111)
Creatinine, Ser: 2.8 mg/dL — ABNORMAL HIGH (ref 0.44–1.00)
GFR calc Af Amer: 18 mL/min — ABNORMAL LOW (ref >60)
GFR calc non Af Amer: 16 mL/min — ABNORMAL LOW (ref >60)
Glucose, Bld: 88 mg/dL (ref 70–99)
Potassium: 3.9 mmol/L (ref 3.5–5.1)
Sodium: 134 mmol/L — ABNORMAL LOW (ref 135–145)

## 2019-09-22 LAB — CBC
HCT: 22.8 % — ABNORMAL LOW (ref 36.0–46.0)
Hemoglobin: 8 g/dL — ABNORMAL LOW (ref 12.0–15.0)
MCH: 29 pg (ref 26.0–34.0)
MCHC: 35.1 g/dL (ref 30.0–36.0)
MCV: 82.6 fL (ref 80.0–100.0)
Platelets: 229 10*3/uL (ref 150–400)
RBC: 2.76 MIL/uL — ABNORMAL LOW (ref 3.87–5.11)
RDW: 14.7 % (ref 11.5–15.5)
WBC: 7.3 10*3/uL (ref 4.0–10.5)
nRBC: 0 % (ref 0.0–0.2)

## 2019-09-22 LAB — BPAM RBC
Blood Product Expiration Date: 202012072359
ISSUE DATE / TIME: 202012041016
Unit Type and Rh: 5100

## 2019-09-22 LAB — TYPE AND SCREEN
ABO/RH(D): O POS
Antibody Screen: NEGATIVE
Unit division: 0

## 2019-09-22 LAB — GLUCOSE, CAPILLARY
Glucose-Capillary: 97 mg/dL (ref 70–99)
Glucose-Capillary: 167 mg/dL — ABNORMAL HIGH (ref 70–99)

## 2019-09-22 MED ORDER — SODIUM BICARBONATE 650 MG PO TABS: 1300.0000 mg | ORAL_TABLET | Freq: Two times a day (BID) | ORAL | 0 refills | Status: DC

## 2019-09-22 NOTE — Progress Notes (Signed)
Pt discharged form hospital to home. Sister Hilda Blades drove her and received discharge instructions and indicated she understood.

## 2019-09-22 NOTE — Discharge Summary (Signed)
Discharge Summary  Alyssa [name] WYO:[id number] DOB: 11-25-1940  PCP: Loretha Brasil, FNP  Admit date: 09/17/2019 Discharge date: 09/22/2019  Time spent:  31 minutes  Recommendations for Outpatient Follow-up:  1. Primary care physician  Discharge Diagnoses:  Active Hospital Problems   Diagnosis Date Noted   ARF (acute renal failure) ([place]) 09/17/2019   Encephalopathy 09/18/2019   SAH (subarachnoid hemorrhage) ([place]) 09/18/2019   Dementia ([place]) 77/41/2878   Metabolic acidosis 67/67/2094   Acute encephalopathy 09/17/2019   CKD (chronic kidney disease) stage 4, GFR 15-29 ml/min (HCC) 12/28/2016   Multiple falls 09/17/2016   Essential hypertension 04/25/2016   Gait difficulty 01/12/2016   Spondylolisthesis at L4-L5 level 07/30/2015   Swelling of limb-Legs 07/29/2014    Resolved Hospital Problems  No resolved problems to display.    Discharge Condition: Improved  Diet recommendation: Regular diet with Ensure supplements  Vitals:   09/22/19 0803 09/22/19 1107  BP: (!) 156/84 (!) 172/96  Pulse: 71 72  Resp:    Temp: 98.2 F (36.8 C) 98 F (36.7 C)  SpO2: 100% 100%    History of present illness:  This is a 78 year old female with history of hypertension chronic kidney disease stage IV, peripheral vascular disease, dementia, brought to the hospital with poor p.o. intake increasing lethargy generalized weakness and worsening on admission was noted to have acute kidney injury on chronic kidney disease with significant metabolic acidosis this was associated with encephalopathy due to dehydration, imaging of the brain did not show any acute infarct but did have incidental finding of trace subarachnoid hemorrhage.  Neurosurgery was consulted they reviewed the images and they did not find any indication for surgical intervention this subarachnoid hemorrhage was not felt to be the cause of her ankle follow-up at the and repeat of CT scan on September 19, 2019 showed  regression of the hemorrhage and she was started on ASA on September 29, 2019 and restarted on Plavix September 23, 2019.  September 21, 2019 her hemoglobin went down to with no nausea or blood in her stool or melena or abdominal pain.  She received 1 unit of packed RBC with improvement in her hemoglobin.  She is eating well she is drinking Ensure supplements and she is very eager to be discharged home  Hospital Course:  Principal Problem:   ARF (acute renal failure) ([place]) Active Problems:   Swelling of limb-Legs   Spondylolisthesis at L4-L5 level   Gait difficulty   Essential hypertension   Multiple falls   CKD (chronic kidney disease) stage 4, GFR 15-29 ml/min (HCC)   Acute encephalopathy   Encephalopathy   SAH (subarachnoid hemorrhage) (HCC)   Dementia (HCC)   Metabolic acidosis    This is a 78 year old female with history of hypertension chronic kidney disease stage IV, peripheral vascular disease, dementia, brought to the hospital with poor p.o. intake increasing lethargy generalized weakness and worsening on admission was noted to have acute kidney injury on chronic kidney disease with significant metabolic acidosis this was associated with encephalopathy due to dehydration, imaging of the brain did not show any acute infarct but did have incidental finding of trace subarachnoid hemorrhage.  Neurosurgery was consulted they reviewed the images and they did not find any indication for surgical intervention this subarachnoid hemorrhage was not felt to be the cause of her ankle follow-up at the and repeat of CT scan on September 19, 2019 showed regression of the hemorrhage and she was started on ASA on September 29, 2019 and  restarted on Plavix September 23, 2019. September 21, 2019 her hemoglobin went down to with no nausea or blood in her stool or melena or abdominal pain.  She received 1 unit of packed RBC with improvement in her hemoglobin to 8.0 this morning.  She is eating well she is drinking Ensure  supplements and she is very eager to be discharged home  AKI on CKD3 with baseline creatinine 1.7 and GFR of 34 likely prerenal in the setting of dehydration. Presented with creatinine of 4.86 with GFR of 9 Creatinine slightly trended up this am 3.28 from 3.22  Continue to avoid nephrotoxins No UO recorded Will add strict I&O C/w LR Continue daily BMPs  Resolved Acute metabolic encephalopathy likely multifactorial secondary to acute illness, hypernatremia, and dehydration.   CT head was found to be unremarkable. MRI scan did show small subarachnoid hemorrhage. This was not felt to be contributing to her symptoms. MRI brain was negative for acute infarct. Valproic acid level checked and was not elevated. Ammonia level normal. TSH normal. Patient received IV hydration and had a substantial improvement in her mental status.  She is alert and oriented x3.  Answers all questions appropriately.  Resolved hypovolemia hypernatremia post IV fluid hydration Presented with Serum sodium 151>>133 Received D5W On lactated Ringer at 75 cc/h for AKI Encourage oral fluid intake.  Non-anion gap metabolic acidosis likely secondary to worsening renal function Chemistry bicarb 19 and anion gap of 12 on 09/20/2019 Na+ bicarb p.o. 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 No sign of overt bleeding Continue to monitor H&H  Subarachnoid hemorrhage Neurosurgery reviewed imaging, no indication for surgical intervention SAH wasnot felt to be the cause of herencephalopathy. Repeat CT scan done 12/2 shows regression of hemorrhage.  Restarted ASA on 09/20/19 Restart Plavix on  09/23/19  History of peripheral vascular disease.  C/w antiplatelets Continue Crestor 40 mg daily  Ambulatory dysfunction repeated falls PT OT assessed and recommended SNF.  Family declines.  Requests home health services.  Case manager assisting.   If family cannot provide 24h assistance/supervision may need SNF Continue PT OT with assistance and fall precautions  Resolved Neck pain.  CT of her C-spine 12/2>>No evidence of acute cervical spine fracture or traumatic subluxation. Multilevel cervicothoracic spondylosis, similar to previous studies.  Resolved uncontrolled hypertension.  Blood pressure is at goal.  Continuehome BiDil, metoprolol  Resolved sinus tachycardia. Suspect there was some element of reflex tachycardia since she has not  receiving metoprolol.   Resolved on beta-blocker.    Procedures:  Blood transfusion 1 unit  Consultations:  None  Discharge Exam: BP (!) 172/96    Pulse 72    Temp 98 F (36.7 C) (Oral)    Resp 18    Ht 4\' 10"  (1.473 m)    Wt 44.3 kg    SpO2 100%    BMI 20.41 kg/m   General: Elderly pleasant no distress Cardiovascular: Regular rate and rhythm Respiratory: Work of breathing is normal clear to auscultation bilaterally  Discharge Instructions You were cared for by a hospitalist during your hospital stay. If you have any questions about your discharge medications or the  care you received while you were in the hospital after you are discharged, you can call the unit and asked to speak with the hospitalist on call if the hospitalist that took care of you is not available. Once you are discharged, your primary care physician will handle any further medical issues. Please note that NO REFILLS for any discharge medications will be authorized once you are discharged, as it is imperative that you return to your primary care physician (or establish a relationship with a primary care physician if you do not have one) for your aftercare needs so  that they can reassess your need for medications and monitor your lab values.  Discharge Instructions    Call MD for:  persistant nausea and vomiting   Complete by: As directed    Call MD for:  severe uncontrolled pain   Complete by: As directed    Call MD for:  temperature >100.4   Complete by: As directed    Diet - low sodium heart healthy   Complete by: As directed    Discharge instructions   Complete by: As directed    D/C [place] F/U PCP 1-2 WEEKS. PCP TO MONITOR RENAL FUNCTION AND HB   Increase activity slowly   Complete by: As directed      Allergies as of 09/22/2019   No Known Allergies     Medication List    STOP taking these medications   clonazePAM 1 MG tablet Commonly known as: KLONOPIN   cyclobenzaprine 5 MG tablet Commonly known as: FLEXERIL   gabapentin 100 MG capsule Commonly known as: NEURONTIN     TAKE these medications   acetaminophen 325 MG tablet Commonly known as: TYLENOL Take 2 tablets (650 mg total) by mouth every 6 (six) hours as needed for mild pain, moderate pain or fever (or Fever >/= 101).   aspirin 81 MG EC tablet Take 1 tablet (81 mg total) by mouth daily.   busPIRone 15 MG tablet Commonly known as: BUSPAR Take 15 mg by mouth 3 (three) times daily as needed for anxiety.   citalopram 20 MG tablet Commonly known as: CeleXA Take 1 tablet (20 mg total) by mouth daily.   clopidogrel 75 MG tablet Commonly known as: PLAVIX Take 1 tablet (75 mg total) by mouth daily.   donepezil 5 MG tablet Commonly known as: ARICEPT Take 5 mg by mouth daily.   feeding supplement (ENSURE ENLIVE) Liqd Take 237 mLs by mouth 2 (two) times daily between meals.   Iron High-Potency 325 MG Tabs Take 325 mg by mouth daily with breakfast.   isosorbide-hydrALAZINE 20-37.5 MG tablet Commonly known as: BIDIL Take 2 tablets by mouth 3 (three) times daily.   magnesium oxide 400 (241.3 Mg) MG tablet Commonly known as: MAG-OX Take 1 tablet (400 mg  total) by mouth 2 (two) times daily.   megestrol 40 MG tablet Commonly known as: MEGACE Take 40 mg by mouth 2 (two) times daily.   metoprolol tartrate 25 MG tablet Commonly known as: LOPRESSOR Take 2 tablets (50 mg total) by mouth 2 (two) times daily.   multivitamin with minerals Tabs tablet Take 1 tablet by mouth daily.   pantoprazole 40 MG tablet Commonly known as: PROTONIX Take 1 tablet (40 mg total) by mouth daily.   rosuvastatin 40 MG tablet Commonly known as: CRESTOR Take 1 tablet (40 mg total) by mouth at bedtime.   sodium bicarbonate 650 MG tablet Take 2 tablets (1,300 mg total) by mouth  2 (two) times daily with a meal.   temazepam 15 MG capsule Commonly known as: RESTORIL Take 15 mg by mouth at bedtime.   valproic acid 250 MG capsule Commonly known as: DEPAKENE Take 1 capsule (250 mg total) by mouth 2 (two) times daily.      No Known Allergies    The results of significant diagnostics from this hospitalization (including imaging, microbiology, ancillary and laboratory) are listed below for reference.    Significant Diagnostic Studies: Dg Pelvis 1-2 Views  Result Date: 08/31/2019 CLINICAL DATA:  Right hip pain with radiation down the thigh, no recent fall. No swelling. It has EXAM: PELVIS - 1-2 VIEW COMPARISON:  02/21/2010 also recent CT evaluation of 06/07/2019 FINDINGS: New nondisplaced fracture of the right acetabulum along the margin of the acetabular component extending from the medial wall of the acetabulum along the inferior margin of the acetabular component of a right hip arthroplasty. Superior migration of the acetabular component is similar to the prior study. Osteopenia with cerclage wires about the proximal right femur along with bone fragments and small areas of heterotopic ossification show no change since previous exam. Left hip is located. Signs of spinal fusion in the lumbar spine. Chronic deformity of right iliac bone is similar to previous  study. Signs of previous right inferior pubic ramus fractures similar to prior as well. IMPRESSION: Periprosthetic right acetabular fracture without displacement. Electronically Signed   By: Zetta Bills M.D.   On: 08/31/2019 13:39   Ct Head Wo Contrast  Result Date: 09/19/2019 CLINICAL DATA:  78 year old female with suspected trace subarachnoid hemorrhage in the left sylvian fissure on MRI yesterday. According to the EMR the patient did have a recent fall. EXAM: CT HEAD WITHOUT CONTRAST TECHNIQUE: Contiguous axial images were obtained from the base of the skull through the vertex without intravenous contrast. COMPARISON:  Brain MRI 09/18/2019 and head CT 09/17/2019. FINDINGS: Brain: Trace hyperdensity along the posterior left sylvian fissure does appear regressed from the head CT yesterday (compare sagittal image 37 yesterday to image 41 today). No intraventricular hemorrhage is identified. No other subarachnoid blood identified. Stable gray-white matter differentiation throughout the brain. White matter hypodensity including deep white matter capsule involvement greater on the left. No midline shift, ventriculomegaly, mass effect, evidence of mass lesion, or evidence of cortically based acute infarction. Vascular: Calcified atherosclerosis at the skull base. No suspicious intracranial vascular hyperdensity. Skull: No acute osseous abnormality identified. Sinuses/Orbits: Stable, evidence of chronic right maxillary sinusitis. Tympanic cavities and mastoids remain clear. Other: No acute orbit or scalp soft tissue finding. IMPRESSION: 1. Continued suspicion of trace subarachnoid hemorrhage in the posterior left sylvian fissure, which seems regressed from the non-contrast head CT 09/17/2019. This is probably sequelae of recent fall. 2. No new intracranial abnormality identified. Chronic small vessel disease. Electronically Signed   By: Genevie Ann M.D.   On: 09/19/2019 03:31   Ct Head Wo Contrast  Result Date:  09/17/2019 CLINICAL DATA:  Focal neuro deficit. Slurred speech and altered mental status since Thursday. EXAM: CT HEAD WITHOUT CONTRAST TECHNIQUE: Contiguous axial images were obtained from the base of the skull through the vertex without intravenous contrast. COMPARISON:  12/05/2017 MRI.  CT head dated 06/23/2017. FINDINGS: Brain: No evidence of acute infarction, hemorrhage, hydrocephalus, extra-axial collection or mass lesion/mass effect. Mild age related atrophy and chronic microvascular ischemic changes are noted bilaterally. Vascular: No hyperdense vessel or unexpected calcification. Skull: Normal. Negative for fracture or focal lesion. 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 Sinuses/Orbits: No acute finding. Other: None. IMPRESSION: No acute intracranial abnormality. Electronically Signed   By: Constance Holster M.D.   On: 09/17/2019 15:52   Ct Cervical Spine Wo Contrast  Result Date: 09/19/2019 CLINICAL DATA:  Significant neck pain with movement.  Recent fall. EXAM: CT CERVICAL SPINE WITHOUT CONTRAST TECHNIQUE: Multidetector CT imaging of the cervical spine was performed without intravenous contrast. Multiplanar CT image reconstructions were also generated. COMPARISON:  CT cervical spine 12/2018. FINDINGS: Alignment: Stable mild anterolisthesis at C4-5 and C5-6 secondary to facet disease. Skull base and vertebrae: No evidence of acute cervical spine fracture or traumatic subluxation. Probable chronic interbody and left interfacetal fusion at C6-7. Prominent degenerative endplate sclerosis at Z6-X0, T1-2, T2-3 and T4-5. The posterior arch of C1 is incomplete. Soft tissues and spinal canal: No prevertebral fluid or swelling. No visible canal hematoma. Disc levels: Multilevel spondylosis with posterior osteophytes and facet hypertrophy, similar to previous CT. No large disc herniation identified. Upper chest: Atherosclerosis of the aorta and great vessels. Emphysematous changes are  present at both lung apices. Chronic periosteal thickening of the 1st ribs bilaterally. Other: None. IMPRESSION: 1. No evidence of acute cervical spine fracture or traumatic subluxation. 2. Multilevel cervicothoracic spondylosis, similar to previous studies. Electronically Signed   By: Richardean Sale M.D.   On: 09/19/2019 16:19   Mr Angio Head Wo Contrast  Result Date: 09/18/2019 CLINICAL DATA:  Cerebral aneurysm, subarachnoid hemorrhage, cerebral vasospasm evaluation. EXAM: MRA HEAD WITHOUT CONTRAST TECHNIQUE: Angiographic images of the Circle of Willis were obtained using MRA technique without intravenous contrast. COMPARISON:  Brain MRI performed earlier the same day 09/18/2019, head CT 09/17/2019 FINDINGS: The examination is significantly motion degraded. This limits evaluation for stenoses and for small aneurysms. This also precludes adequate evaluation for vasospasm. The intracranial internal carotid arteries are patent with atherosclerotic irregularity. Apparent moderate/severe stenosis within the cavernous left ICA may be accentuated by vessel tortuosity at this site. Apparent moderate focal stenosis within the cavernous right ICA. The right middle cerebral artery is patent. Mild/moderate focal stenosis within the distal M1 right MCA may reflect atherosclerotic narrowing or vasospasm. Motion degradation precludes adequate evaluation of the M2 right MCA branch vessels. The M1 left middle cerebral artery is patent without appreciable stenosis. Motion degradation precludes adequate evaluation of the M2 left MCA branch vessels. Motion degradation precludes adequate evaluation of the anterior cerebral arteries. No definite intracranial aneurysm is identified. Significant atherosclerotic irregularity of the non-dominant intracranial right vertebral artery which likely terminates predominantly as the right PICA. The dominant intracranial left vertebral artery is patent without significant stenosis, as is the  basilar artery. Flow related signal is present within the proximal posterior cerebral arteries bilaterally. IMPRESSION: 1. Significantly motion degraded examination. This limits evaluation for stenoses and for small aneurysms. This also precludes adequate evaluation for vasospasm. 2. Atherosclerotic irregularity of the bilateral intracranial internal carotid arteries. Apparent moderate/severe stenosis within the cavernous left ICA may be accentuated by vessel tortuosity at this site. Apparent moderate focal stenosis within the cavernous right ICA. 3. Mild/moderate focal stenosis within the distal M1 right MCA, which may reflect atherosclerotic narrowing or vasospasm. 4. Motion degradation precludes adequate evaluation of the bilateral M2 MCA branch vessels and of the bilateral anterior cerebral arteries. 5. Significant atherosclerotic irregularity of the non dominant intracranial right vertebral artery, which likely terminates predominantly as the right PICA. 6. No definite intracranial aneurysm is identified. Electronically Signed   By: Kellie Simmering DO   On: 09/18/2019 09:58  Mr Brain 50 Contrast  Result Date: 09/18/2019 CLINICAL DATA:  78 year old female with slurred speech and altered mental status. EXAM: MRI HEAD WITHOUT CONTRAST TECHNIQUE: Multiplanar, multiecho pulse sequences of the brain and surrounding structures were obtained without intravenous contrast. COMPARISON:  Brain MRI 12/05/2017.  Head CT earlier tonight. FINDINGS: Brain: There is trace abnormal T1 and FLAIR signal in the left sylvian fissure on series 11, image 13 and series 12, image 13 which corresponds to subtle hyperdensity on the earlier CT. This is new compared to the 2019 MRI. Regional flow voids on T2 imaging seem to remain stable. No diffusion restriction in the region. Only subtle associated T2* signal loss. No similar abnormal subarachnoid signal elsewhere. No intraventricular blood or debris. No restricted diffusion to suggest  acute infarction. No midline shift, mass effect, evidence of mass lesion, ventriculomegaly. Pituitary normal limits. Motion artifact on sagittal T1 weighted imaging at the cervicomedullary junction and upper cervical spine. Chronic widely scattered and patchy bilateral cerebral white matter T2 and FLAIR hyperintensity appears not significantly changed since 2019. No definite cortical encephalomalacia. No chronic cerebral blood products suspected. Vascular: Major intracranial vascular flow voids are stable. Dominant left vertebral artery. Skull and upper cervical spine: Visualized bone marrow signal is within normal limits. Cervical spine obscured by motion. Sinuses/Orbits: Stable, negative. Other: Mastoids remain clear. Scalp and face soft tissues appear negative. IMPRESSION: 1. Small volume abnormal T1 and FLAIR signal which seems to be in the posterior left sylvian fissure is favored to correspond to Trace Subarachnoid Hemorrhage which was subtle on the head CT at 1537 hours yesterday. Although nonspecific similar small volume subarachnoid blood can be seen in the elderly after a fall. Consider a repeat non-contrast Head CT in the next several hours (coming up on 12 hours from the prior) for correlation. 2. No associated acute infarct or other acute intracranial abnormality identified. Electronically Signed   By: Genevie Ann M.D.   On: 09/18/2019 01:34   US Renal  Result Date: 09/17/2019 CLINICAL DATA:  Acute kidney injury EXAM: RENAL / URINARY TRACT ULTRASOUND COMPLETE COMPARISON:  03/08/2019 FINDINGS: Right Kidney: Renal measurements: 7.1 x 2.9 x 3.4 cm = volume: 36 mL. Severely atrophic with diffusely increased echotexture. 6 mm cyst. No hydronephrosis. Left Kidney: Renal measurements: 9.3 x 5.9 x 4.5 cm = volume: 130 mL. Small cysts, the largest 1.3 cm in the mid to upper pole. Diffuse increased echotexture. No hydronephrosis. Bladder: Decompressed, difficult to evaluate walls. Other: None. IMPRESSION:  Diffusely increased echotexture compatible chronic medical renal disease. Severely atrophic right kidney. No hydronephrosis. Electronically Signed   By: Rolm Baptise M.D.   On: 09/17/2019 19:18   Dg Chest Portable 1 View  Result Date: 09/17/2019 CLINICAL DATA:  Altered mental status, slurred speech EXAM: PORTABLE CHEST 1 VIEW COMPARISON:  03/22/2019 chest radiograph. FINDINGS: Stable cardiomediastinal silhouette with normal heart size. No pneumothorax. No pleural effusion. Lungs appear clear, with no acute consolidative airspace disease and no pulmonary edema. IMPRESSION: No active disease. Electronically Signed   By: Ilona Sorrel M.D.   On: 09/17/2019 14:27   Dg Femur, Min 2 Views Right  Result Date: 08/31/2019 CLINICAL DATA:  Right hip pain with radiation down the thigh. EXAM: RIGHT FEMUR 2 VIEWS COMPARISON:  Recent CT evaluation of 06/07/2019 FINDINGS: Nondisplaced fracture along the inferior margin of the right acetabular component as described in the pelvic study of the same date. Similar appearance of the femoral component, cerclage wires and bony fragments when compared to CT study  of 06/07/2019 and more remote evaluations of the pelvis. No signs of additional fracture along the femoral shaft. IMPRESSION: Right acetabular fracture better seen on the pelvic study that was acquired concurrently. Electronically Signed   By: Zetta Bills M.D.   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 Stenosis in the right external carotid artery (<50%).  Stenosis in the left internal carotid artery (16-49%). Stenosis in the left external carotid artery (<50%). Heterogeneous plaque bilateral carotid arteries noted including CCA. Antegrade right vertebral artery flow. Antegrade left vertebral artery flow. No significant change from 09/12/2018. Follow up in one year is appropriate if  clinically indicated.    Microbiology: Recent Results (from the past 240 hour(s))  SARS CORONAVIRUS 2 (TAT 6-24 HRS) Nasopharyngeal Nasopharyngeal Swab     Status: None   Collection Time: 09/17/19  3:56 PM   Specimen: Nasopharyngeal Swab  Result Value Ref Range Status   SARS Coronavirus 2 NEGATIVE NEGATIVE Final    Comment: (NOTE) SARS-CoV-2 target nucleic acids are NOT DETECTED. The SARS-CoV-2 RNA is generally detectable in upper and lower respiratory specimens during the acute phase of infection. Negative results do not preclude SARS-CoV-2 infection, do not rule out co-infections with other pathogens, and should not be used as the sole basis for treatment or other patient management decisions. Negative results must be combined with clinical observations, patient history, and epidemiological information. The expected result is Negative. Fact Sheet for Patients: Sugar[employer] Fact Sheet for Healthcare Providers: https://www.woods-mathews.com/ This test is not yet approved or cleared by the [place] FDA and  has been authorized for detection and/or diagnosis of SARS-CoV-2 by FDA under an Emergency Use Authorization (EUA). This EUA will remain  in effect (meaning this test can be used) for the duration of the COVID-19 declaration under Section 56 4(b)(1) of the Act, 21 U.S.C. section 360bbb-3(b)(1), unless the authorization is terminated or revoked sooner. Performed at [facility], [place] [address] [place], [place] [address]   MRSA PCR Screening     Status: None   Collection Time: 09/18/19  8:10 PM   Specimen: Nasal Mucosa; Nasopharyngeal  Result Value Ref Range Status   MRSA by PCR NEGATIVE NEGATIVE Final    Comment:        The GeneXpert MRSA Assay (FDA approved for NASAL specimens only), is one component of a comprehensive MRSA colonization surveillance program. It is not intended to diagnose MRSA infection nor to guide  or monitor treatment for MRSA infections. Performed at [facility], [place] [address], [place],  [address]      Labs: Basic Metabolic Panel: Recent Labs  Lab 09/18/19 1251 09/19/19 0981 09/20/19 0336 09/21/19 0606 09/22/19 0234  NA 148* 151* 135 133* 134*  K 4.0 3.2* 3.9 3.8 3.9  CL 122* 115* 104 105 104  CO2 10* 19* 19* 18* 20*  GLUCOSE 121* 155* 109* 105* 88  BUN 59* 49* 43* 44* 47*  CREATININE 4.16* 3.49* 3.22* 3.28* 2.80*  CALCIUM 9.0 9.0 8.0* 8.6* 8.1*  MG  --   --  1.6*  --   --    Liver Function Tests: Recent Labs  Lab 09/17/19 1408 09/19/19 0632  AST 15 18  ALT 9 8  ALKPHOS 112 104  BILITOT 0.5 0.2*  PROT 7.1 6.5  ALBUMIN 3.7 3.4*   No results for input(s): LIPASE, AMYLASE in the last 168  hours. Recent Labs  Lab 09/17/19 2223  AMMONIA 19   CBC: Recent Labs  Lab 09/17/19 1408 09/17/19 2223 09/19/19 1056 09/20/19 0336 09/20/19 1233 09/21/19 0241 09/22/19 0234  WBC 6.3 7.9 9.4 8.4  --  8.8 7.3  NEUTROABS 3.4  --   --   --   --   --   --   HGB 9.3* 10.0* 9.8* 7.0* 7.3* 7.1* 8.0*  HCT 29.2* 32.5* 27.7* 20.0* 21.1* 20.6* 22.8*  MCV 88.5 91.8 80.3 81.6  --  82.1 82.6  PLT 301 286 280 216  --  242 229   Cardiac Enzymes: Recent Labs  Lab 09/17/19 2223  CKTOTAL 254*   BNP: BNP (last 3 results) Recent Labs    12/10/18 0618 12/11/18 0424 03/22/19 2128  BNP 744.1* 471.1* 161.5*    ProBNP (last 3 results) No results for input(s): PROBNP in the last 8760 hours.  CBG: Recent Labs  Lab 09/21/19 1300 09/21/19 1612 09/21/19 2249 09/22/19 0635 09/22/19 1217  GLUCAP 133* 72 87 97 167*       Signed:  Cristal Deer, MD [facility] 09/22/2019, 1:52 PM

## 2019-09-22 NOTE — TOC Transition Note (Signed)
Transition of Care [facility]) - CM/SW Discharge Note   Patient Details  Name: Alyssa [name] MRN: [id number] Date of Birth: Nov 01, 1940  Transition of Care [facility]) CM/SW Contact:  Carles Collet, RN Phone Number: 09/22/2019, 1:29 PM   Clinical Narrative:    Tyson Babinski that patient will DC today.     Final next level of care: Home w Home Health Services Barriers to Discharge: No Barriers Identified   Patient Goals and CMS Choice   CMS Medicare.gov Compare Post Acute Care list provided to:: Patient Represenative (must comment)(son)    Discharge Placement                       Discharge Plan and Services   Discharge Planning Services: CM Consult Post Acute Care Choice: Home Health                               Social Determinants of Health (SDOH) Interventions     Readmission Risk Interventions Readmission Risk Prevention Plan 12/26/2018  Transportation Screening Complete  Medication Review (RN Care Manager) Complete  PCP or Specialist appointment within 3-5 days of discharge Not Complete  PCP/Specialist Appt Not Complete comments pt for snf. Snf MD to see as needed.  [place] or Home Care Consult Not Complete  HRI or Home Care Consult Pt Refusal Comments NA  SW Recovery Care/Counseling Consult Not Complete  SW Consult Not Complete Comments NA  Palliative Care Screening Complete  Skilled Nursing Facility Complete  Some recent data might be hidden

## 2019-10-22 NOTE — ED Provider Notes (Signed)
[facility] EMERGENCY DEPARTMENT Provider Note   CSN: [id number] Arrival date & time: 08/31/19  1219     History Chief Complaint  Patient presents with  . Leg Pain    Alyssa [name] is a 79 y.o. female.  Patient presents with lateral hip pain without witnessed or known fall.  No fevers chills or rash.  Patient's had that hip replaced in the past.  Pain with range of motion.  No other new concerns.        Past Medical History:  Diagnosis Date  . Anxiety   . Arthritis   . Chest pain   . Chronic kidney disease   . Elevated troponin 12/13/2018  . Headache(784.0)   . Hyperlipidemia   . Hypertension   . Joint pain   . Leg pain   . Peripheral neuropathy   . Peripheral vascular disease [facility])     Patient Active Problem List   Diagnosis Date Noted  . Encephalopathy 09/18/2019  . SAH (subarachnoid hemorrhage) ([place]) 09/18/2019  . Dementia ([place]) 09/18/2019  . Metabolic acidosis 42/68/3419  . ARF (acute renal failure) ([place]) 09/17/2019  . Acute encephalopathy 09/17/2019  . Abnormal CT scan, colon   . Diarrhea   . Diverticulitis 06/07/2019  . Depression with anxiety 06/07/2019  . DNR (do not resuscitate) 03/23/2019  . Volume overload 03/23/2019  . Edema 03/23/2019  . Acute renal failure (ARF) ([place]) 03/08/2019  . Hypokalemia 03/08/2019  . Palliative care by specialist   . Goals of care, counseling/discussion   . Altered mental status   . Encephalopathy, toxic 12/24/2018  . Aspiration pneumonia of both lower lobes due to gastric secretions ([place]) 12/24/2018  . Non-ST elevation (NSTEMI) myocardial infarction ([place])   . Chest pain   . Dyspnea on exertion   . Elevated troponin level 12/09/2018  . Chronic migraine 03/27/2018  . Lumbar radiculopathy 03/27/2018  . Gait abnormality 03/27/2018  . CKD (chronic kidney disease) stage 4, GFR 15-29 ml/min (H[facility]) 12/28/2016  . Symptomatic anemia 12/28/2016  . Chronic pain syndrome 12/28/2016  .  Benzodiazepine withdrawal without complication ([place]) 62/22/9798  . Chronic right shoulder pain 12/06/2016  . Fall 09/17/2016  . Fracture of humeral head, closed, right, initial encounter 09/17/2016  . Rib fractures 09/17/2016  . Multiple falls 09/17/2016  . Severe muscle deconditioning 09/17/2016  . Failure to thrive in adult 09/17/2016  . Melena 04/25/2016  . Essential hypertension 04/25/2016  . Anxiety state 04/25/2016  . Tobacco abuse 04/25/2016  . Hyperlipidemia 04/25/2016  . Syncope 04/24/2016  . Syncope and collapse 04/24/2016  . Gait difficulty 01/12/2016  . Foot drop, left 01/12/2016  . Severe recurrent major depression without psychotic features ([place]) 08/28/2015  . Spondylolisthesis at L4-L5 level 07/30/2015  . PVD (peripheral vascular disease) with claudication ([place]) 07/29/2014  . Weakness-Bilateral arm/leg 07/29/2014  . Numbness-left leg 07/29/2014  . Swelling of limb-Legs 07/29/2014  . Atherosclerosis of native arteries of the extremities with intermittent claudication 09/30/2011    Past Surgical History:  Procedure Laterality Date  . ABDOMINAL ANGIOGRAM N/A 10/29/2011   Procedure: ABDOMINAL ANGIOGRAM;  Surgeon: Elam Dutch, MD;  Location: [facility] CATH LAB;  Service: Cardiovascular;  Laterality: N/A;  . BACK SURGERY    . FEMORAL-FEMORAL BYPASS GRAFT  08/31/2010  . FLEXIBLE SIGMOIDOSCOPY N/A 06/12/2019   Procedure: FLEXIBLE SIGMOIDOSCOPY;  Surgeon: Ladene Artist, MD;  Location: [facility] ENDOSCOPY;  Service: Endoscopy;  Laterality: N/A;  Patient had little bit to eat this morning so this will be  unsedated.  Marland Kitchen JOINT REPLACEMENT Right 2012   Hip  . LOWER EXTREMITY ANGIOGRAPHY  06/14/2017   Procedure: Lower Extremity Angiography;  Surgeon: Adrian Prows, MD;  Location: [facility] CV LAB;  Service: Cardiovascular;;  Bilateral limited angio performed  . RENAL ANGIOGRAPHY N/A 06/14/2017   Procedure: Renal Angiography;  Surgeon: Adrian Prows, MD;  Location: [place] [facility] CV LAB;   Service: Cardiovascular;  Laterality: N/A;  . TOTAL HIP ARTHROPLASTY     right     OB History   No obstetric history on file.     Family History  Problem Relation Age of Onset  . Heart attack Mother   . Heart disease Mother   . Hyperlipidemia Mother   . Hypertension Mother   . Heart attack Father   . Heart disease Father        Before age 67  . Hyperlipidemia Father   . Hypertension Father   . Cancer Sister        brain tumor  . Aneurysm Brother        brain  . Colon cancer Neg Hx   . Liver cancer Neg Hx     Social History   Tobacco Use  . Smoking status: Former Smoker    Packs/day: 1.00    Years: 27.00    Pack years: 27.00    Types: Cigarettes    Quit date: 11/22/2018    Years since quitting: 0.9  . Smokeless tobacco: Never Used  Substance Use Topics  . Alcohol use: No  . Drug use: No    Home Medications Prior to Admission medications   Medication Sig Start Date End Date Taking? Authorizing Provider  acetaminophen (TYLENOL) 325 MG tablet Take 2 tablets (650 mg total) by mouth every 6 (six) hours as needed for mild pain, moderate pain or fever (or Fever >/= 101). 06/16/19   Hongalgi, Lenis Dickinson, MD  aspirin 81 MG EC tablet Take 1 tablet (81 mg total) by mouth daily. 01/09/19   Caren Griffins, MD  busPIRone (BUSPAR) 15 MG tablet Take 15 mg by mouth 3 (three) times daily as needed for anxiety. 02/28/19   [provider]  citalopram (CELEXA) 20 MG tablet Take 1 tablet (20 mg total) by mouth daily. 03/23/19 03/22/20  Shelly Coss, MD  clopidogrel (PLAVIX) 75 MG tablet Take 1 tablet (75 mg total) by mouth daily. 01/09/19   Caren Griffins, MD  donepezil (ARICEPT) 5 MG tablet Take 5 mg by mouth daily.    [provider]  feeding supplement, ENSURE ENLIVE, (ENSURE ENLIVE) LIQD Take 237 mLs by mouth 2 (two) times daily between meals. 06/16/19   Hongalgi, Lenis Dickinson, MD  Ferrous Sulfate (IRON HIGH-POTENCY) 325 MG TABS Take 325 mg by mouth daily with breakfast.  06/30/19   Hongalgi, Lenis Dickinson, MD  isosorbide-hydrALAZINE (BIDIL) 20-37.5 MG tablet Take 2 tablets by mouth 3 (three) times daily.    [provider]  magnesium oxide (MAG-OX) 400 (241.3 Mg) MG tablet Take 1 tablet (400 mg total) by mouth 2 (two) times daily. Patient not taking: Reported on 06/22/2019 03/12/19   Flora Lipps, MD  megestrol (MEGACE) 40 MG tablet Take 40 mg by mouth 2 (two) times daily.    [provider]  metoprolol tartrate (LOPRESSOR) 25 MG tablet Take 2 tablets (50 mg total) by mouth 2 (two) times daily. 06/16/19   Hongalgi, Lenis Dickinson, MD  Multiple Vitamin (MULTIVITAMIN WITH MINERALS) TABS tablet Take 1 tablet by mouth daily. Patient not  taking: Reported on 09/19/2019 06/17/19   Modena Jansky, MD  pantoprazole (PROTONIX) 40 MG tablet Take 1 tablet (40 mg total) by mouth daily. 06/16/19   Hongalgi, Lenis Dickinson, MD  rosuvastatin (CRESTOR) 40 MG tablet Take 1 tablet (40 mg total) by mouth at bedtime. 06/16/19   Hongalgi, Lenis Dickinson, MD  sodium bicarbonate 650 MG tablet Take 2 tablets (1,300 mg total) by mouth 2 (two) times daily with a meal. 09/22/19   Cristal Deer, MD  temazepam (RESTORIL) 15 MG capsule Take 15 mg by mouth at bedtime.    [provider]  valproic acid (DEPAKENE) 250 MG capsule Take 1 capsule (250 mg total) by mouth 2 (two) times daily. 01/09/19   Caren Griffins, MD    Allergies    Patient has no known allergies.  Review of Systems   Review of Systems  Constitutional: Negative for chills and fever.  HENT: Negative for congestion.   Eyes: Negative for visual disturbance.  Respiratory: Negative for shortness of breath.   Cardiovascular: Negative for chest pain.  Gastrointestinal: Negative for abdominal pain and vomiting.  Genitourinary: Negative for dysuria and flank pain.  Musculoskeletal: Positive for gait problem. Negative for back pain, neck pain and neck stiffness.  Skin: Negative for rash.  Neurological: Negative for weakness,  light-headedness, numbness and headaches.    Physical Exam Updated Vital Signs BP 132/79 (BP Location: Left Arm)   Pulse 72   Temp 97.9 F (36.6 C) (Oral)   Resp 18   SpO2 97%   Physical Exam Vitals and nursing note reviewed.  Constitutional:      Appearance: She is well-developed.  HENT:     Head: Normocephalic and atraumatic.  Eyes:     General:        Right eye: No discharge.        Left eye: No discharge.     Conjunctiva/sclera: Conjunctivae normal.  Neck:     Trachea: No tracheal deviation.  Cardiovascular:     Rate and Rhythm: Normal rate and regular rhythm.  Pulmonary:     Effort: Pulmonary effort is normal.     Breath sounds: Normal breath sounds.  Abdominal:     General: There is no distension.     Palpations: Abdomen is soft.     Tenderness: There is no abdominal tenderness. There is no guarding.  Musculoskeletal:        General: Tenderness present. No swelling or deformity.     Cervical back: Normal range of motion and neck supple.     Comments: Patient has mild tenderness with external rotation of the right hip.  No leg shortening.  No significant leg edema.  No external sign of infection.  Compartments soft.  Patient has equal strength in all extremities bilateral.  Skin:    General: Skin is warm.     Findings: No rash.  Neurological:     Mental Status: She is alert and oriented to person, place, and time.     ED Results / Procedures / Treatments   Labs (all labs ordered are listed, but only abnormal results are displayed) Labs Reviewed - No data to display  EKG None  Radiology No results found.  Procedures Procedures (including critical care time)  Medications Ordered in ED Medications - No data to display  ED Course  I have reviewed the triage vital signs and the nursing notes.  Pertinent labs & imaging results that were available during my care of the patient were reviewed  by me and considered in my medical decision making (see chart  for details).    MDM Rules/Calculators/A&P                      Patient presents at baseline clinically except for right hip pain and history of hip repair.  X-ray reviewed and located in appropriate position, consistent with acetabular fracture.  Spoke with orthopedics on-call will evaluate the patient in the office.   Final Clinical Impression(s) / ED Diagnoses Final diagnoses:  Right leg pain  Acetabular fracture right hip.   Rx / DC Orders ED Discharge Orders    None       Elnora [place], MD 10/22/19 [phone]

## 2019-10-31 ENCOUNTER — Inpatient Hospital Stay (HOSPITAL_COMMUNITY)
Admission: EM | Admit: 2019-10-31 | Discharge: 2019-11-04 | DRG: 193 | Disposition: A | Discharge status: Home Health | Payer: Medicare HMO | Attending: Internal Medicine | Admitting: Internal Medicine

## 2019-10-31 ENCOUNTER — Emergency Department (HOSPITAL_COMMUNITY): Payer: Medicare HMO

## 2019-10-31 ENCOUNTER — Encounter (HOSPITAL_COMMUNITY): Payer: Self-pay | Admitting: Emergency Medicine

## 2019-10-31 ENCOUNTER — Other Ambulatory Visit: Payer: Self-pay

## 2019-10-31 DIAGNOSIS — E860 Dehydration: Secondary | ICD-10-CM | POA: Diagnosis present

## 2019-10-31 DIAGNOSIS — E875 Hyperkalemia: Secondary | ICD-10-CM | POA: Diagnosis present

## 2019-10-31 DIAGNOSIS — G629 Polyneuropathy, unspecified: Secondary | ICD-10-CM | POA: Diagnosis present

## 2019-10-31 DIAGNOSIS — Z8349 Family history of other endocrine, nutritional and metabolic diseases: Secondary | ICD-10-CM

## 2019-10-31 DIAGNOSIS — Z8249 Family history of ischemic heart disease and other diseases of the circulatory system: Secondary | ICD-10-CM

## 2019-10-31 DIAGNOSIS — I2510 Atherosclerotic heart disease of native coronary artery without angina pectoris: Secondary | ICD-10-CM | POA: Diagnosis present

## 2019-10-31 DIAGNOSIS — J189 Pneumonia, unspecified organism: Secondary | ICD-10-CM | POA: Diagnosis present

## 2019-10-31 DIAGNOSIS — G894 Chronic pain syndrome: Secondary | ICD-10-CM | POA: Diagnosis present

## 2019-10-31 DIAGNOSIS — N184 Chronic kidney disease, stage 4 (severe): Secondary | ICD-10-CM | POA: Diagnosis present

## 2019-10-31 DIAGNOSIS — Z66 Do not resuscitate: Secondary | ICD-10-CM | POA: Diagnosis present

## 2019-10-31 DIAGNOSIS — K529 Noninfective gastroenteritis and colitis, unspecified: Secondary | ICD-10-CM | POA: Diagnosis present

## 2019-10-31 DIAGNOSIS — I959 Hypotension, unspecified: Secondary | ICD-10-CM | POA: Diagnosis present

## 2019-10-31 DIAGNOSIS — E785 Hyperlipidemia, unspecified: Secondary | ICD-10-CM | POA: Diagnosis present

## 2019-10-31 DIAGNOSIS — E872 Acidosis, unspecified: Secondary | ICD-10-CM

## 2019-10-31 DIAGNOSIS — Z79899 Other long term (current) drug therapy: Secondary | ICD-10-CM

## 2019-10-31 DIAGNOSIS — I129 Hypertensive chronic kidney disease with stage 1 through stage 4 chronic kidney disease, or unspecified chronic kidney disease: Secondary | ICD-10-CM | POA: Diagnosis present

## 2019-10-31 DIAGNOSIS — Z20822 Contact with and (suspected) exposure to covid-19: Secondary | ICD-10-CM | POA: Diagnosis present

## 2019-10-31 DIAGNOSIS — N390 Urinary tract infection, site not specified: Secondary | ICD-10-CM | POA: Diagnosis present

## 2019-10-31 DIAGNOSIS — F418 Other specified anxiety disorders: Secondary | ICD-10-CM | POA: Diagnosis present

## 2019-10-31 DIAGNOSIS — Z7902 Long term (current) use of antithrombotics/antiplatelets: Secondary | ICD-10-CM

## 2019-10-31 DIAGNOSIS — N3000 Acute cystitis without hematuria: Secondary | ICD-10-CM | POA: Diagnosis present

## 2019-10-31 DIAGNOSIS — R519 Headache, unspecified: Secondary | ICD-10-CM | POA: Diagnosis not present

## 2019-10-31 DIAGNOSIS — Z96641 Presence of right artificial hip joint: Secondary | ICD-10-CM | POA: Diagnosis present

## 2019-10-31 DIAGNOSIS — N179 Acute kidney failure, unspecified: Secondary | ICD-10-CM | POA: Diagnosis present

## 2019-10-31 DIAGNOSIS — D631 Anemia in chronic kidney disease: Secondary | ICD-10-CM | POA: Diagnosis present

## 2019-10-31 DIAGNOSIS — F0390 Unspecified dementia without behavioral disturbance: Secondary | ICD-10-CM | POA: Diagnosis present

## 2019-10-31 DIAGNOSIS — Z87891 Personal history of nicotine dependence: Secondary | ICD-10-CM

## 2019-10-31 DIAGNOSIS — I739 Peripheral vascular disease, unspecified: Secondary | ICD-10-CM | POA: Diagnosis present

## 2019-10-31 DIAGNOSIS — I252 Old myocardial infarction: Secondary | ICD-10-CM

## 2019-10-31 DIAGNOSIS — Z7982 Long term (current) use of aspirin: Secondary | ICD-10-CM

## 2019-10-31 DIAGNOSIS — J9601 Acute respiratory failure with hypoxia: Secondary | ICD-10-CM | POA: Diagnosis present

## 2019-10-31 HISTORY — DX: Dyspnea, unspecified: R06.00

## 2019-10-31 LAB — BASIC METABOLIC PANEL
Anion gap: 13 (ref 5–15)
BUN: 46 mg/dL — ABNORMAL HIGH (ref 8–23)
CO2: 9 mmol/L — ABNORMAL LOW (ref 22–32)
Calcium: 9.7 mg/dL (ref 8.9–10.3)
Chloride: 114 mmol/L — ABNORMAL HIGH (ref 98–111)
Creatinine, Ser: 2.56 mg/dL — ABNORMAL HIGH (ref 0.44–1.00)
GFR calc Af Amer: 20 mL/min — ABNORMAL LOW (ref >60)
GFR calc non Af Amer: 18 mL/min — ABNORMAL LOW (ref >60)
Glucose, Bld: 139 mg/dL — ABNORMAL HIGH (ref 70–99)
Potassium: 4.7 mmol/L (ref 3.5–5.1)
Sodium: 136 mmol/L (ref 135–145)

## 2019-10-31 LAB — CBC
HCT: 29.6 % — ABNORMAL LOW (ref 36.0–46.0)
Hemoglobin: 9.4 g/dL — ABNORMAL LOW (ref 12.0–15.0)
MCH: 29.8 pg (ref 26.0–34.0)
MCHC: 31.8 g/dL (ref 30.0–36.0)
MCV: 94 fL (ref 80.0–100.0)
Platelets: 425 10*3/uL — ABNORMAL HIGH (ref 150–400)
RBC: 3.15 MIL/uL — ABNORMAL LOW (ref 3.87–5.11)
RDW: 18.6 % — ABNORMAL HIGH (ref 11.5–15.5)
WBC: 10.5 10*3/uL (ref 4.0–10.5)
nRBC: 0 % (ref 0.0–0.2)

## 2019-10-31 NOTE — ED Triage Notes (Signed)
Pt endorses SOB, diarrhea and chills. Denies CP, abd pain.   CBG 163 120/70 100% RA

## 2019-11-01 ENCOUNTER — Encounter (HOSPITAL_COMMUNITY): Payer: Self-pay | Admitting: Internal Medicine

## 2019-11-01 ENCOUNTER — Inpatient Hospital Stay (HOSPITAL_COMMUNITY): Payer: Medicare HMO

## 2019-11-01 ENCOUNTER — Emergency Department (HOSPITAL_COMMUNITY): Payer: Medicare HMO

## 2019-11-01 DIAGNOSIS — J9601 Acute respiratory failure with hypoxia: Secondary | ICD-10-CM | POA: Diagnosis present

## 2019-11-01 DIAGNOSIS — I2510 Atherosclerotic heart disease of native coronary artery without angina pectoris: Secondary | ICD-10-CM | POA: Diagnosis present

## 2019-11-01 DIAGNOSIS — J189 Pneumonia, unspecified organism: Secondary | ICD-10-CM | POA: Diagnosis present

## 2019-11-01 DIAGNOSIS — D631 Anemia in chronic kidney disease: Secondary | ICD-10-CM | POA: Diagnosis present

## 2019-11-01 DIAGNOSIS — E785 Hyperlipidemia, unspecified: Secondary | ICD-10-CM | POA: Diagnosis present

## 2019-11-01 DIAGNOSIS — Z66 Do not resuscitate: Secondary | ICD-10-CM | POA: Diagnosis present

## 2019-11-01 DIAGNOSIS — Z8249 Family history of ischemic heart disease and other diseases of the circulatory system: Secondary | ICD-10-CM | POA: Diagnosis not present

## 2019-11-01 DIAGNOSIS — Z96641 Presence of right artificial hip joint: Secondary | ICD-10-CM | POA: Diagnosis present

## 2019-11-01 DIAGNOSIS — F0390 Unspecified dementia without behavioral disturbance: Secondary | ICD-10-CM | POA: Diagnosis present

## 2019-11-01 DIAGNOSIS — N184 Chronic kidney disease, stage 4 (severe): Secondary | ICD-10-CM | POA: Diagnosis present

## 2019-11-01 DIAGNOSIS — N390 Urinary tract infection, site not specified: Secondary | ICD-10-CM | POA: Diagnosis not present

## 2019-11-01 DIAGNOSIS — Z20822 Contact with and (suspected) exposure to covid-19: Secondary | ICD-10-CM | POA: Diagnosis present

## 2019-11-01 DIAGNOSIS — E872 Acidosis: Secondary | ICD-10-CM | POA: Diagnosis present

## 2019-11-01 DIAGNOSIS — E875 Hyperkalemia: Secondary | ICD-10-CM | POA: Diagnosis present

## 2019-11-01 DIAGNOSIS — I252 Old myocardial infarction: Secondary | ICD-10-CM | POA: Diagnosis not present

## 2019-11-01 DIAGNOSIS — R519 Headache, unspecified: Secondary | ICD-10-CM | POA: Diagnosis not present

## 2019-11-01 DIAGNOSIS — F418 Other specified anxiety disorders: Secondary | ICD-10-CM | POA: Diagnosis present

## 2019-11-01 DIAGNOSIS — I739 Peripheral vascular disease, unspecified: Secondary | ICD-10-CM | POA: Diagnosis present

## 2019-11-01 DIAGNOSIS — N3000 Acute cystitis without hematuria: Secondary | ICD-10-CM | POA: Diagnosis present

## 2019-11-01 DIAGNOSIS — G894 Chronic pain syndrome: Secondary | ICD-10-CM | POA: Diagnosis present

## 2019-11-01 DIAGNOSIS — N179 Acute kidney failure, unspecified: Secondary | ICD-10-CM | POA: Diagnosis present

## 2019-11-01 DIAGNOSIS — E860 Dehydration: Secondary | ICD-10-CM | POA: Diagnosis present

## 2019-11-01 DIAGNOSIS — G629 Polyneuropathy, unspecified: Secondary | ICD-10-CM | POA: Diagnosis present

## 2019-11-01 DIAGNOSIS — I129 Hypertensive chronic kidney disease with stage 1 through stage 4 chronic kidney disease, or unspecified chronic kidney disease: Secondary | ICD-10-CM | POA: Diagnosis present

## 2019-11-01 DIAGNOSIS — K529 Noninfective gastroenteritis and colitis, unspecified: Secondary | ICD-10-CM | POA: Diagnosis present

## 2019-11-01 HISTORY — DX: Acute respiratory failure with hypoxia: J96.01

## 2019-11-01 LAB — CREATININE, SERUM
Creatinine, Ser: 2.72 mg/dL — ABNORMAL HIGH (ref 0.44–1.00)
GFR calc Af Amer: 19 mL/min — ABNORMAL LOW (ref >60)
GFR calc non Af Amer: 16 mL/min — ABNORMAL LOW (ref >60)

## 2019-11-01 LAB — POCT I-STAT 7, (LYTES, BLD GAS, ICA,H+H)
Acid-base deficit: 14 mmol/L — ABNORMAL HIGH (ref 0.0–2.0)
Bicarbonate: 9.7 mmol/L — ABNORMAL LOW (ref 20.0–28.0)
Calcium, Ion: 1.39 mmol/L (ref 1.15–1.40)
HCT: 26 % — ABNORMAL LOW (ref 36.0–46.0)
Hemoglobin: 8.8 g/dL — ABNORMAL LOW (ref 12.0–15.0)
O2 Saturation: 98 %
Patient temperature: 97.9
Potassium: 5.1 mmol/L (ref 3.5–5.1)
Sodium: 140 mmol/L (ref 135–145)
TCO2: 10 mmol/L — ABNORMAL LOW (ref 22–32)
pCO2 arterial: 17.6 mmHg — CL (ref 32.0–48.0)
pH, Arterial: 7.346 — ABNORMAL LOW (ref 7.350–7.450)
pO2, Arterial: 97 mmHg (ref 83.0–108.0)

## 2019-11-01 LAB — POCT I-STAT EG7
Acid-base deficit: 14 mmol/L — ABNORMAL HIGH (ref 0.0–2.0)
Bicarbonate: 10.1 mmol/L — ABNORMAL LOW (ref 20.0–28.0)
Calcium, Ion: 1.27 mmol/L (ref 1.15–1.40)
HCT: 29 % — ABNORMAL LOW (ref 36.0–46.0)
Hemoglobin: 9.9 g/dL — ABNORMAL LOW (ref 12.0–15.0)
O2 Saturation: 99 %
Potassium: 5.7 mmol/L — ABNORMAL HIGH (ref 3.5–5.1)
Sodium: 139 mmol/L (ref 135–145)
TCO2: 11 mmol/L — ABNORMAL LOW (ref 22–32)
pCO2, Ven: 19.8 mmHg — CL (ref 44.0–60.0)
pH, Ven: 7.317 (ref 7.250–7.430)
pO2, Ven: 139 mmHg — ABNORMAL HIGH (ref 32.0–45.0)

## 2019-11-01 LAB — CBC
HCT: 30.7 % — ABNORMAL LOW (ref 36.0–46.0)
Hemoglobin: 9.5 g/dL — ABNORMAL LOW (ref 12.0–15.0)
MCH: 29.6 pg (ref 26.0–34.0)
MCHC: 30.9 g/dL (ref 30.0–36.0)
MCV: 95.6 fL (ref 80.0–100.0)
Platelets: 456 10*3/uL — ABNORMAL HIGH (ref 150–400)
RBC: 3.21 MIL/uL — ABNORMAL LOW (ref 3.87–5.11)
RDW: 18.6 % — ABNORMAL HIGH (ref 11.5–15.5)
WBC: 10.4 10*3/uL (ref 4.0–10.5)
nRBC: 0 % (ref 0.0–0.2)

## 2019-11-01 LAB — HEMOGLOBIN AND HEMATOCRIT, BLOOD
HCT: 26.1 % — ABNORMAL LOW (ref 36.0–46.0)
HCT: 22.3 % — ABNORMAL LOW (ref 36.0–46.0)
Hemoglobin: 8.3 g/dL — ABNORMAL LOW (ref 12.0–15.0)
Hemoglobin: 7.3 g/dL — ABNORMAL LOW (ref 12.0–15.0)

## 2019-11-01 LAB — CBC WITH DIFFERENTIAL/PLATELET
Abs Immature Granulocytes: 0.04 10*3/uL (ref 0.00–0.07)
Basophils Absolute: 0 10*3/uL (ref 0.0–0.1)
Basophils Relative: 1 %
Eosinophils Absolute: 0.2 10*3/uL (ref 0.0–0.5)
Eosinophils Relative: 2 %
HCT: 22.6 % — ABNORMAL LOW (ref 36.0–46.0)
Hemoglobin: 7.1 g/dL — ABNORMAL LOW (ref 12.0–15.0)
Immature Granulocytes: 1 %
Lymphocytes Relative: 19 %
Lymphs Abs: 1.5 10*3/uL (ref 0.7–4.0)
MCH: 29.7 pg (ref 26.0–34.0)
MCHC: 31.4 g/dL (ref 30.0–36.0)
MCV: 94.6 fL (ref 80.0–100.0)
Monocytes Absolute: 1 10*3/uL (ref 0.1–1.0)
Monocytes Relative: 13 %
Neutro Abs: 5.1 10*3/uL (ref 1.7–7.7)
Neutrophils Relative %: 64 %
Platelets: 359 10*3/uL (ref 150–400)
RBC: 2.39 MIL/uL — ABNORMAL LOW (ref 3.87–5.11)
RDW: 18.4 % — ABNORMAL HIGH (ref 11.5–15.5)
WBC: 7.8 10*3/uL (ref 4.0–10.5)
nRBC: 0 % (ref 0.0–0.2)

## 2019-11-01 LAB — RESPIRATORY PANEL BY RT PCR (FLU A&B, COVID)
Influenza A by PCR: NEGATIVE
Influenza B by PCR: NEGATIVE
SARS Coronavirus 2 by RT PCR: NEGATIVE

## 2019-11-01 LAB — APTT: aPTT: 30 seconds (ref 24–36)

## 2019-11-01 LAB — URINALYSIS, ROUTINE W REFLEX MICROSCOPIC
Bilirubin Urine: NEGATIVE
Glucose, UA: NEGATIVE mg/dL
Hgb urine dipstick: NEGATIVE
Ketones, ur: NEGATIVE mg/dL
Nitrite: NEGATIVE
Protein, ur: 100 mg/dL — AB
Specific Gravity, Urine: 1.012 (ref 1.005–1.030)
WBC, UA: >50 WBC/hpf — ABNORMAL HIGH (ref 0–5)
pH: 5 (ref 5.0–8.0)

## 2019-11-01 LAB — COMPREHENSIVE METABOLIC PANEL
ALT: 10 U/L (ref 0–44)
AST: 21 U/L (ref 15–41)
Albumin: 3.6 g/dL (ref 3.5–5.0)
Alkaline Phosphatase: 90 U/L (ref 38–126)
Anion gap: 13 (ref 5–15)
BUN: 44 mg/dL — ABNORMAL HIGH (ref 8–23)
CO2: 12 mmol/L — ABNORMAL LOW (ref 22–32)
Calcium: 9.6 mg/dL (ref 8.9–10.3)
Chloride: 114 mmol/L — ABNORMAL HIGH (ref 98–111)
Creatinine, Ser: 2.2 mg/dL — ABNORMAL HIGH (ref 0.44–1.00)
GFR calc Af Amer: 24 mL/min — ABNORMAL LOW (ref >60)
GFR calc non Af Amer: 21 mL/min — ABNORMAL LOW (ref >60)
Glucose, Bld: 104 mg/dL — ABNORMAL HIGH (ref 70–99)
Potassium: 5.3 mmol/L — ABNORMAL HIGH (ref 3.5–5.1)
Sodium: 139 mmol/L (ref 135–145)
Total Bilirubin: 0.4 mg/dL (ref 0.3–1.2)
Total Protein: 6.5 g/dL (ref 6.5–8.1)

## 2019-11-01 LAB — TROPONIN I (HIGH SENSITIVITY)
Troponin I (High Sensitivity): 34 ng/L — ABNORMAL HIGH (ref <18)
Troponin I (High Sensitivity): 31 ng/L — ABNORMAL HIGH (ref <18)
Troponin I (High Sensitivity): 28 ng/L — ABNORMAL HIGH (ref <18)

## 2019-11-01 LAB — HEPATIC FUNCTION PANEL
ALT: 10 U/L (ref 0–44)
AST: 29 U/L (ref 15–41)
Albumin: 4.1 g/dL (ref 3.5–5.0)
Alkaline Phosphatase: 102 U/L (ref 38–126)
Bilirubin, Direct: 0.2 mg/dL (ref 0.0–0.2)
Indirect Bilirubin: 0.3 mg/dL (ref 0.3–0.9)
Total Bilirubin: 0.5 mg/dL (ref 0.3–1.2)
Total Protein: 7.6 g/dL (ref 6.5–8.1)

## 2019-11-01 LAB — RETICULOCYTES
Immature Retic Fract: 16.1 % — ABNORMAL HIGH (ref 2.3–15.9)
RBC.: 2.78 MIL/uL — ABNORMAL LOW (ref 3.87–5.11)
Retic Count, Absolute: 73.7 10*3/uL (ref 19.0–186.0)
Retic Ct Pct: 2.7 % (ref 0.4–3.1)

## 2019-11-01 LAB — LIPASE, BLOOD: Lipase: 34 U/L (ref 11–51)

## 2019-11-01 LAB — LACTIC ACID, PLASMA
Lactic Acid, Venous: 1.7 mmol/L (ref 0.5–1.9)
Lactic Acid, Venous: 1.2 mmol/L (ref 0.5–1.9)
Lactic Acid, Venous: 1.3 mmol/L (ref 0.5–1.9)

## 2019-11-01 LAB — IRON AND TIBC
Iron: 56 ug/dL (ref 28–170)
Saturation Ratios: 21 % (ref 10.4–31.8)
TIBC: 263 ug/dL (ref 250–450)
UIBC: 207 ug/dL

## 2019-11-01 LAB — FOLATE: Folate: 14.6 ng/mL (ref >5.9)

## 2019-11-01 LAB — FERRITIN: Ferritin: 557 ng/mL — ABNORMAL HIGH (ref 11–307)

## 2019-11-01 LAB — PROTIME-INR
INR: 1 (ref 0.8–1.2)
Prothrombin Time: 13.2 seconds (ref 11.4–15.2)

## 2019-11-01 LAB — VITAMIN B12: Vitamin B-12: 688 pg/mL (ref 180–914)

## 2019-11-01 MED ORDER — BUSPIRONE HCL 5 MG PO TABS: 15.0000 mg | ORAL_TABLET | Freq: Three times a day (TID) | ORAL | Status: DC | PRN

## 2019-11-01 MED ORDER — SODIUM CHLORIDE 0.9 % IV SOLN
500.0000 mg | INTRAVENOUS | Status: DC
  Administered 2019-11-01 – 2019-11-03 (×3): 500 mg via INTRAVENOUS
  Filled 2019-11-01 (×3): qty 500

## 2019-11-01 MED ORDER — TEMAZEPAM 15 MG PO CAPS
15.0000 mg | ORAL_CAPSULE | Freq: Every day | ORAL | Status: DC
  Administered 2019-11-01 – 2019-11-03 (×3): 15 mg via ORAL
  Filled 2019-11-01 (×3): qty 1

## 2019-11-01 MED ORDER — SODIUM BICARBONATE 650 MG PO TABS
1300.0000 mg | ORAL_TABLET | Freq: Two times a day (BID) | ORAL | Status: DC
  Administered 2019-11-01 – 2019-11-03 (×5): 1300 mg via ORAL
  Filled 2019-11-01 (×7): qty 2

## 2019-11-01 MED ORDER — FERROUS SULFATE 325 (65 FE) MG PO TABS
325.0000 mg | ORAL_TABLET | Freq: Every day | ORAL | Status: DC
  Administered 2019-11-01 – 2019-11-04 (×4): 325 mg via ORAL
  Filled 2019-11-01 (×4): qty 1

## 2019-11-01 MED ORDER — SODIUM CHLORIDE 0.9 % IV SOLN
2.0000 g | Freq: Once | INTRAVENOUS | Status: AC
  Administered 2019-11-01: 2 g via INTRAVENOUS
  Filled 2019-11-01: qty 20

## 2019-11-01 MED ORDER — CITALOPRAM HYDROBROMIDE 20 MG PO TABS
20.0000 mg | ORAL_TABLET | Freq: Every day | ORAL | Status: DC
  Administered 2019-11-01 – 2019-11-04 (×4): 20 mg via ORAL
  Filled 2019-11-01: qty 1
  Filled 2019-11-01: qty 2
  Filled 2019-11-01 (×2): qty 1

## 2019-11-01 MED ORDER — CLOPIDOGREL BISULFATE 75 MG PO TABS
75.0000 mg | ORAL_TABLET | Freq: Every day | ORAL | Status: DC
  Administered 2019-11-01 – 2019-11-04 (×4): 75 mg via ORAL
  Filled 2019-11-01 (×4): qty 1

## 2019-11-01 MED ORDER — MEGESTROL ACETATE 40 MG PO TABS
40.0000 mg | ORAL_TABLET | Freq: Two times a day (BID) | ORAL | Status: DC
  Administered 2019-11-01 – 2019-11-04 (×7): 40 mg via ORAL
  Filled 2019-11-01 (×9): qty 1

## 2019-11-01 MED ORDER — TECHNETIUM TO 99M ALBUMIN AGGREGATED
1.6000 | Freq: Once | INTRAVENOUS | Status: AC | PRN
  Administered 2019-11-01: 1.6 via INTRAVENOUS

## 2019-11-01 MED ORDER — ROSUVASTATIN CALCIUM 20 MG PO TABS
40.0000 mg | ORAL_TABLET | Freq: Every day | ORAL | Status: DC
  Administered 2019-11-01 – 2019-11-03 (×3): 40 mg via ORAL
  Filled 2019-11-01 (×4): qty 2

## 2019-11-01 MED ORDER — ONDANSETRON HCL 4 MG/2ML IJ SOLN: 4.0000 mg | Freq: Four times a day (QID) | INTRAMUSCULAR | Status: DC | PRN

## 2019-11-01 MED ORDER — FENTANYL CITRATE (PF) 100 MCG/2ML IJ SOLN
50.0000 ug | Freq: Once | INTRAMUSCULAR | Status: AC
  Administered 2019-11-01: 50 ug via INTRAVENOUS
  Filled 2019-11-01: qty 2

## 2019-11-01 MED ORDER — HEPARIN SODIUM (PORCINE) 5000 UNIT/ML IJ SOLN
5000.0000 [IU] | Freq: Three times a day (TID) | INTRAMUSCULAR | Status: DC
  Administered 2019-11-01 – 2019-11-04 (×9): 5000 [IU] via SUBCUTANEOUS
  Filled 2019-11-01 (×10): qty 1

## 2019-11-01 MED ORDER — LACTATED RINGERS IV BOLUS (SEPSIS)
1000.0000 mL | Freq: Once | INTRAVENOUS | Status: AC
  Administered 2019-11-01: 1000 mL via INTRAVENOUS

## 2019-11-01 MED ORDER — ONDANSETRON HCL 4 MG PO TABS
4.0000 mg | ORAL_TABLET | Freq: Four times a day (QID) | ORAL | Status: DC | PRN
  Administered 2019-11-01 – 2019-11-04 (×2): 4 mg via ORAL
  Filled 2019-11-01 (×2): qty 1

## 2019-11-01 MED ORDER — ACETAMINOPHEN 325 MG PO TABS
650.0000 mg | ORAL_TABLET | Freq: Four times a day (QID) | ORAL | Status: DC | PRN
  Administered 2019-11-01 – 2019-11-02 (×2): 650 mg via ORAL
  Filled 2019-11-01 (×2): qty 2

## 2019-11-01 MED ORDER — DONEPEZIL HCL 5 MG PO TABS
5.0000 mg | ORAL_TABLET | Freq: Every day | ORAL | Status: DC
  Administered 2019-11-01 – 2019-11-04 (×4): 5 mg via ORAL
  Filled 2019-11-01 (×4): qty 1

## 2019-11-01 MED ORDER — LACTATED RINGERS IV BOLUS (SEPSIS)
500.0000 mL | Freq: Once | INTRAVENOUS | Status: AC
  Administered 2019-11-01: 500 mL via INTRAVENOUS

## 2019-11-01 MED ORDER — LACTATED RINGERS IV SOLN: INTRAVENOUS | Status: AC

## 2019-11-01 MED ORDER — ENSURE ENLIVE PO LIQD
237.0000 mL | Freq: Two times a day (BID) | ORAL | Status: DC
  Administered 2019-11-01 – 2019-11-04 (×5): 237 mL via ORAL
  Filled 2019-11-01: qty 237

## 2019-11-01 MED ORDER — SODIUM CHLORIDE 0.9 % IV SOLN
2.0000 g | INTRAVENOUS | Status: DC
  Administered 2019-11-01 – 2019-11-02 (×2): 2 g via INTRAVENOUS
  Filled 2019-11-01: qty 2
  Filled 2019-11-01: qty 20
  Filled 2019-11-01: qty 2

## 2019-11-01 MED ORDER — ASPIRIN EC 81 MG PO TBEC
81.0000 mg | DELAYED_RELEASE_TABLET | Freq: Every day | ORAL | Status: DC
  Administered 2019-11-01 – 2019-11-04 (×4): 81 mg via ORAL
  Filled 2019-11-01 (×4): qty 1

## 2019-11-01 MED ORDER — TRAMADOL HCL 50 MG PO TABS
50.0000 mg | ORAL_TABLET | Freq: Four times a day (QID) | ORAL | Status: DC | PRN
  Administered 2019-11-01 – 2019-11-03 (×5): 50 mg via ORAL
  Filled 2019-11-01 (×5): qty 1

## 2019-11-01 MED ORDER — ACETAMINOPHEN 650 MG RE SUPP: 650.0000 mg | Freq: Four times a day (QID) | RECTAL | Status: DC | PRN

## 2019-11-01 MED ORDER — METRONIDAZOLE IN NACL 5-0.79 MG/ML-% IV SOLN
500.0000 mg | Freq: Once | INTRAVENOUS | Status: AC
  Administered 2019-11-01: 500 mg via INTRAVENOUS
  Filled 2019-11-01: qty 100

## 2019-11-01 MED ORDER — VALPROIC ACID 250 MG PO CAPS
250.0000 mg | ORAL_CAPSULE | Freq: Two times a day (BID) | ORAL | Status: DC
  Administered 2019-11-01 – 2019-11-04 (×7): 250 mg via ORAL
  Filled 2019-11-01 (×9): qty 1

## 2019-11-01 MED ORDER — METOPROLOL TARTRATE 50 MG PO TABS
50.0000 mg | ORAL_TABLET | Freq: Two times a day (BID) | ORAL | Status: DC
  Administered 2019-11-01 – 2019-11-04 (×7): 50 mg via ORAL
  Filled 2019-11-01 (×6): qty 1
  Filled 2019-11-01: qty 2

## 2019-11-01 MED ORDER — ISOSORB DINITRATE-HYDRALAZINE 20-37.5 MG PO TABS
2.0000 | ORAL_TABLET | Freq: Three times a day (TID) | ORAL | Status: DC
  Administered 2019-11-01 – 2019-11-04 (×10): 2 via ORAL
  Filled 2019-11-01 (×13): qty 2

## 2019-11-01 MED ORDER — PANTOPRAZOLE SODIUM 40 MG PO TBEC
40.0000 mg | DELAYED_RELEASE_TABLET | Freq: Every day | ORAL | Status: DC
  Administered 2019-11-01 – 2019-11-04 (×4): 40 mg via ORAL
  Filled 2019-11-01 (×4): qty 1

## 2019-11-01 NOTE — ED Notes (Signed)
Tele PUI Breakfast ordered  

## 2019-11-01 NOTE — ED Notes (Signed)
Messaged attending again regarding an order for pain medication.

## 2019-11-01 NOTE — ED Notes (Signed)
ED TO INPATIENT HANDOFF REPORT  ED Nurse Name and Phone #:   S Name/Age/Gender Alyssa [name] 79 y.o. female Room/Bed: 005C/005C  Code Status   Code Status: DNR  Home/SNF/Other Home Patient oriented to: self, place, time and situation Is this baseline? Yes   Triage Complete: Triage complete  Chief Complaint Acute respiratory failure with hypoxia ([place]) [J96.01]  Triage Note Pt endorses SOB, diarrhea and chills. Denies CP, abd pain.   CBG 163 120/70 100% RA    Allergies No Known Allergies  Level of Care/Admitting Diagnosis ED Disposition    ED Disposition Condition Comment   Admit  Hospital Area: [place] [[id number]]  Level of Care: Telemetry Medical [104]  Covid Evaluation: Confirmed COVID Negative  Date Laboratory Confirmed COVID Negative: 11/01/2019  Diagnosis: Acute respiratory failure with hypoxia [facility]) [[id number]]  Admitting Physician: Rise Patience [phone]  Attending Physician: Rise Patience [phone]  Estimated length of stay: past midnight tomorrow  Certification:: I certify this patient will need inpatient services for at least 2 midnights       B Medical/Surgery History Past Medical History:  Diagnosis Date  . Anxiety   . Arthritis   . Chest pain   . Chronic kidney disease   . Elevated troponin 12/13/2018  . Headache(784.0)   . Hyperlipidemia   . Hypertension   . Joint pain   . Leg pain   . Peripheral neuropathy   . Peripheral vascular disease [facility])    Past Surgical History:  Procedure Laterality Date  . ABDOMINAL ANGIOGRAM N/A 10/29/2011   Procedure: ABDOMINAL ANGIOGRAM;  Surgeon: Elam Dutch, MD;  Location: [facility] CATH LAB;  Service: Cardiovascular;  Laterality: N/A;  . BACK SURGERY    . FEMORAL-FEMORAL BYPASS GRAFT  08/31/2010  . FLEXIBLE SIGMOIDOSCOPY N/A 06/12/2019   Procedure: FLEXIBLE SIGMOIDOSCOPY;  Surgeon: Ladene Artist, MD;  Location: [facility] ENDOSCOPY;  Service: Endoscopy;  Laterality: N/A;   Patient had little bit to eat this morning so this will be unsedated.  Marland Kitchen JOINT REPLACEMENT Right 2012   Hip  . LOWER EXTREMITY ANGIOGRAPHY  06/14/2017   Procedure: Lower Extremity Angiography;  Surgeon: Adrian Prows, MD;  Location: [place] CV LAB;  Service: Cardiovascular;;  Bilateral limited angio performed  . RENAL ANGIOGRAPHY N/A 06/14/2017   Procedure: Renal Angiography;  Surgeon: Adrian Prows, MD;  Location: [place] CV LAB;  Service: Cardiovascular;  Laterality: N/A;  . TOTAL HIP ARTHROPLASTY     right     A IV Location/Drains/Wounds Patient Lines/Drains/Airways Status   Active Line/Drains/Airways    Name:   Placement date:   Placement time:   Site:   Days:   Peripheral IV 11/01/19 Right Forearm   11/01/19    0151    Forearm   less than 1   Peripheral IV 10/31/19 Left Forearm   10/31/19    --    Forearm   1          Intake/Output Last 24 hours  Intake/Output Summary (Last 24 hours) at 11/01/2019 1537 Last data filed at 11/01/2019 0858 Gross per 24 hour  Intake 1990 ml  Output 800 ml  Net 1190 ml    Labs/Imaging Results for orders placed or performed during the hospital encounter of 10/31/19 (from the past 48 hour(s))  CBC     Status: Abnormal   Collection Time: 10/31/19  4:47 PM  Result Value Ref Range   WBC 10.5 4.0 - 10.5 K/uL   RBC 3.15 (L)  3.87 - 5.11 MIL/uL   Hemoglobin 9.4 (L) 12.0 - 15.0 g/dL   HCT 29.6 (L) 36.0 - 46.0 %   MCV 94.0 80.0 - 100.0 fL   MCH 29.8 26.0 - 34.0 pg   MCHC 31.8 30.0 - 36.0 g/dL   RDW 18.6 (H) 11.5 - 15.5 %   Platelets 425 (H) 150 - 400 K/uL   nRBC 0.0 0.0 - 0.2 %    Comment: Performed at [facility] [address], [place], [place] [address]  Basic metabolic panel     Status: Abnormal   Collection Time: 10/31/19  4:47 PM  Result Value Ref Range   Sodium 136 135 - 145 mmol/L   Potassium 4.7 3.5 - 5.1 mmol/L   Chloride 114 (H) 98 - 111 mmol/L   CO2 9 (L) 22 - 32 mmol/L   Glucose, Bld 139 (H) 70 - 99 mg/dL   BUN 46 (H)  8 - 23 mg/dL   Creatinine, Ser 2.56 (H) 0.44 - 1.00 mg/dL   Calcium 9.7 8.9 - 10.3 mg/dL   GFR calc non Af Amer 18 (L) >60 mL/min   GFR calc Af Amer 20 (L) >60 mL/min   Anion gap 13 5 - 15    Comment: Performed at [facility] [address] [place], [place] [address]  Urinalysis, Routine w reflex microscopic     Status: Abnormal   Collection Time: 11/01/19  1:08 AM  Result Value Ref Range   Color, Urine YELLOW YELLOW   APPearance CLOUDY (A) CLEAR   Specific Gravity, Urine 1.012 1.005 - 1.030   pH 5.0 5.0 - 8.0   Glucose, UA NEGATIVE NEGATIVE mg/dL   Hgb urine dipstick NEGATIVE NEGATIVE   Bilirubin Urine NEGATIVE NEGATIVE   Ketones, ur NEGATIVE NEGATIVE mg/dL   Protein, ur 100 (A) NEGATIVE mg/dL   Nitrite NEGATIVE NEGATIVE   Leukocytes,Ua LARGE (A) NEGATIVE   RBC / HPF 0-5 0 - 5 RBC/hpf   WBC, UA >50 (H) 0 - 5 WBC/hpf   Bacteria, UA RARE (A) NONE SEEN   Squamous Epithelial / LPF 0-5 0 - 5   Hyaline Casts, UA PRESENT     Comment: Performed at [facility], [place] [address], [place], [place] [address]  Lactic acid, plasma     Status: None   Collection Time: 11/01/19  1:55 AM  Result Value Ref Range   Lactic Acid, Venous 1.7 0.5 - 1.9 mmol/L    Comment: Performed at [facility] [address], [place], [place] [address]  APTT     Status: None   Collection Time: 11/01/19  1:55 AM  Result Value Ref Range   aPTT 30 24 - 36 seconds    Comment: Performed at [facility] [address] [place], [place] [address]  Protime-INR     Status: None   Collection Time: 11/01/19  1:55 AM  Result Value Ref Range   Prothrombin Time 13.2 11.4 - 15.2 seconds   INR 1.0 0.8 - 1.2    Comment: (NOTE) INR goal varies based on device and disease states. Performed at [facility], [place] [address], [place], [place] [address]   Lipase, blood     Status: None   Collection Time: 11/01/19  1:55 AM  Result Value Ref Range   Lipase 34 11 - 51 U/L    Comment: Performed  at [facility] [address], [place],  [address]  Hepatic function panel     Status: None   Collection Time: 11/01/19  1:55 AM  Result Value Ref Range   Total Protein 7.6 6.5 - 8.1 g/dL   Albumin 4.1 3.5 - 5.0 g/dL   AST 29 15 - 41 U/L   ALT 10 0 - 44 U/L   Alkaline Phosphatase 102 38 - 126 U/L   Total Bilirubin 0.5 0.3 - 1.2 mg/dL   Bilirubin, Direct 0.2 0.0 - 0.2 mg/dL   Indirect Bilirubin 0.3 0.3 - 0.9 mg/dL    Comment: Performed at [facility] [address], [place], [place] [address]  I-STAT 7, (LYTES, BLD GAS, ICA, H+H)     Status: Abnormal   Collection Time: 11/01/19  2:28 AM  Result Value Ref Range   pH, Arterial 7.346 (L) 7.350 - 7.450   pCO2 arterial 17.6 (LL) 32.0 - 48.0 mmHg   pO2, Arterial 97.0 83.0 - 108.0 mmHg   Bicarbonate 9.7 (L) 20.0 - 28.0 mmol/L   TCO2 10 (L) 22 - 32 mmol/L   O2 Saturation 98.0 %   Acid-base deficit 14.0 (H) 0.0 - 2.0 mmol/L   Sodium 140 135 - 145 mmol/L   Potassium 5.1 3.5 - 5.1 mmol/L   Calcium, Ion 1.39 1.15 - 1.40 mmol/L   HCT 26.0 (L) 36.0 - 46.0 %   Hemoglobin 8.8 (L) 12.0 - 15.0 g/dL   Patient temperature 97.9 F    Collection site RADIAL, ALLEN'S TEST ACCEPTABLE    Drawn by RT    Sample type ARTERIAL    Comment NOTIFIED PHYSICIAN   Respiratory Panel by RT PCR (Flu A&B, Covid) - Nasopharyngeal Swab     Status: None   Collection Time: 11/01/19  2:40 AM   Specimen: Nasopharyngeal Swab  Result Value Ref Range   SARS Coronavirus 2 by RT PCR NEGATIVE NEGATIVE    Comment: (NOTE) SARS-CoV-2 target nucleic acids are NOT DETECTED. The SARS-CoV-2 RNA is generally detectable in upper respiratoy specimens during the acute phase of infection. The lowest concentration of SARS-CoV-2 viral copies this assay can detect is 131 copies/mL. A negative result does not preclude SARS-Cov-2 infection and should not be used as the sole basis for treatment or other patient management decisions. A negative result may occur with   improper specimen collection/handling, submission of specimen other than nasopharyngeal swab, presence of viral mutation(s) within the areas targeted by this assay, and inadequate number of viral copies (<131 copies/mL). A negative result must be combined with clinical observations, patient history, and epidemiological information. The expected result is Negative. Fact Sheet for Patients:  PinkCheek.be Fact Sheet for Healthcare Providers:  GravelBags.it This test is not yet ap proved or cleared by the [place] FDA and  has been authorized for detection and/or diagnosis of SARS-CoV-2 by FDA under an Emergency Use Authorization (EUA). This EUA will remain  in effect (meaning this test can be used) for the duration of the COVID-19 declaration under Section 564(b)(1) of the Act, 21 U.S.C. section 360bbb-3(b)(1), unless the authorization is terminated or revoked sooner.    Influenza A by PCR NEGATIVE NEGATIVE   Influenza B by PCR NEGATIVE NEGATIVE    Comment: (NOTE) The Xpert Xpress SARS-CoV-2/FLU/RSV assay is intended as an aid in  the diagnosis of influenza from Nasopharyngeal swab specimens and  should not be used as a sole basis for treatment. Nasal washings and  aspirates are unacceptable for Xpert Xpress SARS-CoV-2/FLU/RSV  testing. Fact Sheet for Patients: PinkCheek.be Fact Sheet  for Healthcare Providers: GravelBags.it This test is not yet approved or cleared by the [place] and  has been authorized for detection and/or diagnosis of SARS-CoV-2 by  FDA under an Emergency Use Authorization (EUA). This EUA will remain  in effect (meaning this test can be used) for the duration of the  Covid-19 declaration under Section 564(b)(1) of the Act, 21  U.S.C. section 360bbb-3(b)(1), unless the authorization is  terminated or revoked. Performed at [facility], [place] [address], [place], [place] [address]   POCT I-Stat EG7     Status: Abnormal   Collection Time: 11/01/19  2:47 AM  Result Value Ref Range   pH, Ven 7.317 7.250 - 7.430   pCO2, Ven 19.8 (LL) 44.0 - 60.0 mmHg   pO2, Ven 139.0 (H) 32.0 - 45.0 mmHg   Bicarbonate 10.1 (L) 20.0 - 28.0 mmol/L   TCO2 11 (L) 22 - 32 mmol/L   O2 Saturation 99.0 %   Acid-base deficit 14.0 (H) 0.0 - 2.0 mmol/L   Sodium 139 135 - 145 mmol/L   Potassium 5.7 (H) 3.5 - 5.1 mmol/L   Calcium, Ion 1.27 1.15 - 1.40 mmol/L   HCT 29.0 (L) 36.0 - 46.0 %   Hemoglobin 9.9 (L) 12.0 - 15.0 g/dL   Patient temperature HIDE    Sample type VENOUS    Comment NOTIFIED PHYSICIAN   CBC     Status: Abnormal   Collection Time: 11/01/19  3:52 AM  Result Value Ref Range   WBC 10.4 4.0 - 10.5 K/uL   RBC 3.21 (L) 3.87 - 5.11 MIL/uL   Hemoglobin 9.5 (L) 12.0 - 15.0 g/dL   HCT 30.7 (L) 36.0 - 46.0 %   MCV 95.6 80.0 - 100.0 fL   MCH 29.6 26.0 - 34.0 pg   MCHC 30.9 30.0 - 36.0 g/dL   RDW 18.6 (H) 11.5 - 15.5 %   Platelets 456 (H) 150 - 400 K/uL   nRBC 0.0 0.0 - 0.2 %    Comment: Performed at [facility], [place] [address] [place], [place] [address]  Creatinine, serum     Status: Abnormal   Collection Time: 11/01/19  3:52 AM  Result Value Ref Range   Creatinine, Ser 2.72 (H) 0.44 - 1.00 mg/dL   GFR calc non Af Amer 16 (L) >60 mL/min   GFR calc Af Amer 19 (L) >60 mL/min    Comment: Performed at [facility] [address] [place],  [address]  Comprehensive metabolic panel     Status: Abnormal   Collection Time: 11/01/19  8:19 AM  Result Value Ref Range   Sodium 139 135 - 145 mmol/L   Potassium 5.3 (H) 3.5 - 5.1 mmol/L   Chloride 114 (H) 98 - 111 mmol/L   CO2 12 (L) 22 - 32 mmol/L   Glucose, Bld 104 (H) 70 - 99 mg/dL   BUN 44 (H) 8 - 23 mg/dL   Creatinine, Ser 2.20 (H) 0.44 - 1.00 mg/dL   Calcium 9.6 8.9 - 10.3 mg/dL   Total Protein 6.5 6.5 - 8.1 g/dL   Albumin 3.6 3.5 - 5.0 g/dL   AST 21 15  - 41 U/L   ALT 10 0 - 44 U/L   Alkaline Phosphatase 90 38 - 126 U/L   Total Bilirubin 0.4 0.3 - 1.2 mg/dL   GFR calc non Af Amer 21 (L) >60 mL/min   GFR calc Af Amer 24 (L) >60 mL/min  Anion gap 13 5 - 15    Comment: Performed at [facility] [address] [place], [place] [address]  CBC with Differential/Platelet     Status: Abnormal   Collection Time: 11/01/19  8:19 AM  Result Value Ref Range   WBC 7.8 4.0 - 10.5 K/uL   RBC 2.39 (L) 3.87 - 5.11 MIL/uL   Hemoglobin 7.1 (L) 12.0 - 15.0 g/dL    Comment: REPEATED TO VERIFY   HCT 22.6 (L) 36.0 - 46.0 %   MCV 94.6 80.0 - 100.0 fL   MCH 29.7 26.0 - 34.0 pg   MCHC 31.4 30.0 - 36.0 g/dL   RDW 18.4 (H) 11.5 - 15.5 %   Platelets 359 150 - 400 K/uL   nRBC 0.0 0.0 - 0.2 %   Neutrophils Relative % 64 %   Neutro Abs 5.1 1.7 - 7.7 K/uL   Lymphocytes Relative 19 %   Lymphs Abs 1.5 0.7 - 4.0 K/uL   Monocytes Relative 13 %   Monocytes Absolute 1.0 0.1 - 1.0 K/uL   Eosinophils Relative 2 %   Eosinophils Absolute 0.2 0.0 - 0.5 K/uL   Basophils Relative 1 %   Basophils Absolute 0.0 0.0 - 0.1 K/uL   Immature Granulocytes 1 %   Abs Immature Granulocytes 0.04 0.00 - 0.07 K/uL    Comment: Performed at [facility] [address] [place], [place] [address]  Lactic acid, plasma     Status: None   Collection Time: 11/01/19  8:19 AM  Result Value Ref Range   Lactic Acid, Venous 1.2 0.5 - 1.9 mmol/L    Comment: Performed at [facility] [address] [place], [place] [address]  Lactic acid, plasma     Status: None   Collection Time: 11/01/19  2:56 PM  Result Value Ref Range   Lactic Acid, Venous 1.3 0.5 - 1.9 mmol/L    Comment: Performed at [facility] [address] [place], [place] [address]  Reticulocytes     Status: Abnormal   Collection Time: 11/01/19  2:57 PM  Result Value Ref Range   Retic Ct Pct 2.7 0.4 - 3.1 %   RBC. 2.78 (L) 3.87 - 5.11 MIL/uL   Retic Count, Absolute 73.7 19.0 - 186.0 K/uL    Immature Retic Fract 16.1 (H) 2.3 - 15.9 %    Comment: Performed at [facility] [address] [place], [place] [address]  Hemoglobin and hematocrit, blood     Status: Abnormal   Collection Time: 11/01/19  2:57 PM  Result Value Ref Range   Hemoglobin 8.3 (L) 12.0 - 15.0 g/dL   HCT 26.1 (L) 36.0 - 46.0 %    Comment: Performed at [facility], [place] [address] [place], [place] [address]   CT ABDOMEN PELVIS WO CONTRAST  Result Date: 11/01/2019 CLINICAL DATA:  Abdominal distension diarrhea and chills EXAM: CT ABDOMEN AND PELVIS WITHOUT CONTRAST TECHNIQUE: Multidetector CT imaging of the abdomen and pelvis was performed following the standard protocol without IV contrast. COMPARISON:  June 07, 2019 FINDINGS: Lower chest: The visualized heart size within normal limits. No pericardial fluid/thickening. A small to moderate hiatal hernia is present. The visualized portions of the lungs are clear. Hepatobiliary: Although limited due to the lack of intravenous contrast, normal in appearance without gross focal abnormality. No evidence of calcified gallstones or biliary ductal dilatation. Pancreas:  Unremarkable.  No surrounding inflammatory changes. Spleen: Normal in size.  Although limited due to the lack of intravenous contrast, normal in appearance. Adrenals/Urinary Tract: Both adrenal glands appear normal. Again noted is slight atrophy of the right kidney. No renal or collecting system calculi are seen. The bladder is partially distended. Stomach/Bowel: The small bowel is normal in appearance. There is somewhat limited visualization of the distal colon, however there appears to be a moderate amount of colonic stool present. There is question of mild wall thickening seen at the hepatic flexure, series 3, image 30. No significant surrounding fat stranding changes however are noted. Scattered colonic diverticula are noted, however no definite evidence of surrounding fat stranding changes.  Vascular/Lymphatic: There are no enlarged abdominal or pelvic lymph nodes. Scattered aortic atherosclerotic calcifications are seen without aneurysmal dilatation. Reproductive: The uterus and adnexa are unremarkable. Other: No evidence of abdominal wall mass or hernia. 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 The patient has had a prior posterior lumbar fixation at L4-L5 with a grade 1 anterolisthesis of L5 on S1. Increased sclerosis seen at the endplates of D9-M4. healed bilateral inferior pubic rami fractures are seen. IMPRESSION: 1. Somewhat limited examination 2. Question of mild wall thickening seen at the hepatic flexure which could be due to mild colitis. 3. Moderate hiatal hernia 4.  Aortic Atherosclerosis (ICD10-I70.0). Electronically Signed   By: Prudencio Pair M.D.   On: 11/01/2019 01:38   DG Chest 2 View  Result Date: 10/31/2019 CLINICAL DATA:  Shortness of chest pain for several days EXAM: CHEST - 2 VIEW COMPARISON:  Radiograph 09/17/2019 FINDINGS: Patchy nodular opacities are present in the right lower lobe. No other consolidative airspace disease is seen. Lobular contour deformity of the left hemidiaphragm may reflect a small eventration or Bochdalek's hernia. No pneumothorax or effusion. The cardiomediastinal contours are unremarkable. No acute osseous or soft tissue abnormality. Degenerative changes are present in the imaged spine and shoulders. IMPRESSION: 1. Patchy nodular opacities in the right lower lobe suspicious for pneumonia though recommend continued surveillance until resolution as underlying lesion cannot be excluded. 2. Lobular contour deformity of the left hemidiaphragm may reflect a small eventration or Bochdalek's hernia. Electronically Signed   By: Lovena Le M.D.   On: 10/31/2019 17:24   NM  Pulmonary Perf and Vent  Result Date: 11/01/2019 CLINICAL DATA:  Chronic kidney disease. Abnormal x-ray. Pleural effusion. Sudden onset of shortness of breath. EXAM: NUCLEAR MEDICINE PERFUSION LUNG SCAN TECHNIQUE: Perfusion images were obtained in multiple projections after intravenous injection of radiopharmaceutical. Ventilation scans intentionally deferred if perfusion scan and chest x-ray adequate for interpretation during COVID 19 epidemic. RADIOPHARMACEUTICALS:  1.6 mCi Tc-20m MAA IV COMPARISON:  12/10/2018 FINDINGS: No suspicious segmental perfusion defects identified to suggest acute pulmonary embolus. Soft tissue attenuation artifact is identified, particularly on the lateral projection images as the patient was imaged with arms by her side. IMPRESSION: 1. No evidence for acute pulmonary embolus. Electronically Signed   By: Kerby Moors M.D.   On: 11/01/2019 14:19    Pending Labs Unresulted Labs (From admission, onward)    Start     Ordered   11/02/19 2683  Basic metabolic panel  Tomorrow morning,   R     11/01/19 1442   11/02/19 0500  CBC  Tomorrow morning,   R     11/01/19 1442   11/01/19 1500  Hemoglobin and hematocrit,  blood  Now then every 8 hours,   R (with STAT occurrences)     11/01/19 1441   11/01/19 1442  Vitamin B12  (Anemia Panel (PNL))  Once,   STAT     11/01/19 1441   11/01/19 1442  Folate  (Anemia Panel (PNL))  Once,   STAT     11/01/19 1441   11/01/19 1442  Iron and TIBC  (Anemia Panel (PNL))  Once,   STAT     11/01/19 1441   11/01/19 1442  Ferritin  (Anemia Panel (PNL))  Once,   STAT     11/01/19 1441   11/01/19 1441  Occult blood card to lab, stool  ONCE - STAT,   STAT     11/01/19 1441   11/01/19 0025  Blood Culture (routine x 2)  BLOOD CULTURE X 2,   STAT     11/01/19 0025   11/01/19 0025  Urine culture  ONCE - STAT,   STAT     11/01/19 0025          Vitals/Pain Today's Vitals   11/01/19 1300 11/01/19 1400 11/01/19 1434 11/01/19 1500  BP: 135/83 (!)  163/95  139/84  Pulse: 73 76  73  Resp: 18 (!) 23  17  Temp:      TempSrc:      SpO2: 100% 100%  99%  Weight:      Height:      PainSc:   9      Isolation Precautions No active isolations  Medications Medications  aspirin EC tablet 81 mg (81 mg Oral Given 11/01/19 0902)  megestrol (MEGACE) tablet 40 mg (40 mg Oral Given 11/01/19 1111)  isosorbide-hydrALAZINE (BIDIL) 20-37.5 MG per tablet 2 tablet (2 tablets Oral Given 11/01/19 1112)  metoprolol tartrate (LOPRESSOR) tablet 50 mg (50 mg Oral Given 11/01/19 0902)  rosuvastatin (CRESTOR) tablet 40 mg (has no administration in time range)  busPIRone (BUSPAR) tablet 15 mg (has no administration in time range)  citalopram (CELEXA) tablet 20 mg (20 mg Oral Given 11/01/19 1119)  donepezil (ARICEPT) tablet 5 mg (5 mg Oral Given 11/01/19 1113)  temazepam (RESTORIL) capsule 15 mg (has no administration in time range)  pantoprazole (PROTONIX) EC tablet 40 mg (40 mg Oral Given 11/01/19 0911)  sodium bicarbonate tablet 1,300 mg (1,300 mg Oral Given 11/01/19 0600)  clopidogrel (PLAVIX) tablet 75 mg (75 mg Oral Given 11/01/19 0903)  ferrous sulfate tablet 325 mg (325 mg Oral Given 11/01/19 0848)  valproic acid (DEPAKENE) 250 MG capsule 250 mg (250 mg Oral Given 11/01/19 1112)  feeding supplement (ENSURE ENLIVE) (ENSURE ENLIVE) liquid 237 mL (237 mLs Oral Given 11/01/19 1113)  acetaminophen (TYLENOL) tablet 650 mg (650 mg Oral Given 11/01/19 0902)    Or  acetaminophen (TYLENOL) suppository 650 mg ( Rectal See Alternative 11/01/19 0902)  ondansetron (ZOFRAN) tablet 4 mg (4 mg Oral Given 11/01/19 0903)    Or  ondansetron (ZOFRAN) injection 4 mg ( Intravenous See Alternative 11/01/19 0903)  heparin injection 5,000 Units (5,000 Units Subcutaneous Given 11/01/19 1528)  lactated ringers infusion ( Intravenous New Bag/Given 11/01/19 0857)  cefTRIAXone (ROCEPHIN) 2 g in sodium chloride 0.9 % 100 mL IVPB (has no administration in time range)  azithromycin (ZITHROMAX) 500  mg in sodium chloride 0.9 % 250 mL IVPB (0 mg Intravenous Stopped 11/01/19 0855)  traMADol (ULTRAM) tablet 50 mg (50 mg Oral Given 11/01/19 1528)  lactated ringers bolus 1,000 mL (0 mLs Intravenous Stopped 11/01/19 0421)  And  lactated ringers bolus 500 mL (0 mLs Intravenous Stopped 11/01/19 0511)  cefTRIAXone (ROCEPHIN) 2 g in sodium chloride 0.9 % 100 mL IVPB (0 g Intravenous Stopped 11/01/19 0219)  metroNIDAZOLE (FLAGYL) IVPB 500 mg (0 mg Intravenous Stopped 11/01/19 0341)  fentaNYL (SUBLIMAZE) injection 50 mcg (50 mcg Intravenous Given 11/01/19 0341)  technetium albumin aggregated (MAA) injection solution 1.6 millicurie (1.6 millicuries Intravenous Contrast Given 11/01/19 1330)    Mobility walks with device Moderate fall risk   Focused Assessments Pulmonary Assessment Handoff:  Lung sounds: Bilateral Breath Sounds: Clear L Breath Sounds: Clear R Breath Sounds: Clear O2 Device: Room Air        R Recommendations: See Admitting Provider Note  Report given to:   Additional Notes:  Patient endorses lower abdominal discomfort and states she has been having shortness of breath which has been there for last 2 days. Chest x-ray shows possibility of PNA and UA concerning for UTI. CT abdomen and pelvis concerning for colitis. Labs show negative Covid test and ABG was done which shows pH of 7.34 PCO2 70.6 PO2 of 97. Hemoglobin was 9.5 WBC 10.4 EKG was sinus sinus tachycardia with Bicarb of 9.  Metabolic panel creatinine of 2.5 Patient received 2 L fluid bolus and started on empiric antibiotics and admitted for further management.

## 2019-11-01 NOTE — Progress Notes (Signed)
New Admission Note:   Arrival Method: Bed  Mental Orientation: Alert and oriented  Telemetry: Box 9  Assessment: Completed Skin: intact dry  XA:FHSVE forearm  Pain: 0/10  Tubes: none  Safety Measures: Safety Fall Prevention Plan has been given, discussed and signed Admission: Completed [facility] Orientation: Patient has been orientated to the room, unit and staff.  Family: none   Orders have been reviewed and implemented. Will continue to monitor the patient. Call light has been placed within reach and bed alarm has been activated.   Irvan Tiedt RN [facility] Phone: [phone]

## 2019-11-01 NOTE — ED Notes (Signed)
Spoke with [facility], pt should be going for V/Q scan next.

## 2019-11-01 NOTE — ED Notes (Signed)
Estimated values based off of recent documented height and weight

## 2019-11-01 NOTE — Progress Notes (Signed)
  Patient seen and examined personally, I reviewed the chart, history and physical and admission note, done by admitting physician this morning and agree with the same with following addendum.  Please refer to the morning admission note for more detailed plan of care.  On my exam patient is alert awake feels much better.  On room air Has mild chest discomfort VQ scan pending. Also c/o of mid abdominal discomfort. Afebrile in the ER blood pressure 160s to 170s.  Briefly, Per admitting:  79 y.o. female with history CKD stage IV, peripheral vascular disease, hypertension, anemia admitted last month for dehydration and at that time patient also had a subarachnoid hemorrhage which was managed conservatively and was placed back on antiplatelet agents with sudden shortness of breath. patient has not had any fever chills nausea vomiting or diarrhea.  on the ER patient also was complaining of lower abdominal discomfort and states she has been having shortness of breath which has been there for last 2 days.  Chest x-ray shows possibility of pneumonia and UA is concerning for UTI.  CT abdomen and pelvis done shows features concerning hepatic flexure colitis.  Labs show negative Covid test and ABG was done which shows pH of 7.34 PCO2 70.6 PO2 of 97.  Hemoglobin was 9.5 WBC 10.4 EKG was sinus sinus tachycardia with bicarb of 9 and the metabolic panel creatinine of 2.5.  Patient was started on 2 L fluid bolus and started on empiric antibiotics and admitted for further management of:  Acute hypoxic respiratory failure/possible community-acquired pneumonia: On my exam she is on room air complain of some mild chest pain, VQ scan pending.  Blood culture pending.  CAD- chest pain- Check serial troponin has history of CAD.  Continue aspirin Plavix, Imdur, Crestor. Suspect UTI: abnormal UA WBC more than 50, large leukocyte esterase, cloudy urine. urine culture pending.  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  Patient on ceftriaxone. [place] metabolic acidosis: Bicarbonate 12.  Monitor.  Continue oral bicarb, hco at 9->12, monitor.  Lactate normal. AKI on CKD stage IV oral bicarb at home.  Creatinine improved to 2.2 from 2.5.  Continue gentle IV fluids.  If creatinine not improving or continues to have metabolic stenosis will consult her nephrology. Mild hyperkalemia 5.1->5.7-> improved to 5.3 on recheck this morning Hypertension: Blood pressure borderline controlled. History of depression on Depakote and Celexa PVD on statins and antiplatelets Dementia alert, awake and pleasant. Anemia, suspect acute on chronic disease hemoglobin this morning is 7.1 g previously 8.8 to 9.5 g.  Will repeat hemoglobin and transfuse if less than 7 g, check FOBT, iron panel T01 and folic acid. No obvious bleeding. Headache we will add Excedrin, she normally takes at home. Unrelieved by  Tylenol

## 2019-11-01 NOTE — ED Provider Notes (Signed)
[facility] EMERGENCY DEPARTMENT Provider Note   CSN: [id number] Arrival date & time: 10/31/19  1640    Level of level 5 caveat due to acuity of condition History Chief Complaint  Patient presents with  . Shortness of Breath    Alyssa [name] is a 79 y.o. female.  The history is provided by the patient. The history is limited by the condition of the patient.  Shortness of Breath Severity:  Severe Onset quality:  Sudden Timing:  Constant Progression:  Worsening Chronicity:  New Relieved by:  None tried Worsened by:  Nothing Associated symptoms: abdominal pain   Patient with history of chronic kidney disease, metabolic acidosis, peripheral vascular disease, dementia presents with shortness of breath.  Patient reports having shortness of breath over the past day.  She also reports abdominal pain.  No active chest pain. She denies any fevers or vomiting.  No cough. No other details are known at this time     Past Medical History:  Diagnosis Date  . Anxiety   . Arthritis   . Chest pain   . Chronic kidney disease   . Elevated troponin 12/13/2018  . Headache(784.0)   . Hyperlipidemia   . Hypertension   . Joint pain   . Leg pain   . Peripheral neuropathy   . Peripheral vascular disease [facility])     Patient Active Problem List   Diagnosis Date Noted  . Encephalopathy 09/18/2019  . SAH (subarachnoid hemorrhage) ([place]) 09/18/2019  . Dementia ([place]) 09/18/2019  . Metabolic acidosis 38/18/2993  . ARF (acute renal failure) ([place]) 09/17/2019  . Acute encephalopathy 09/17/2019  . Abnormal CT scan, colon   . Diarrhea   . Diverticulitis 06/07/2019  . Depression with anxiety 06/07/2019  . DNR (do not resuscitate) 03/23/2019  . Volume overload 03/23/2019  . Edema 03/23/2019  . Acute renal failure (ARF) ([place]) 03/08/2019  . Hypokalemia 03/08/2019  . Palliative care by specialist   . Goals of care, counseling/discussion   . Altered mental status   .  Encephalopathy, toxic 12/24/2018  . Aspiration pneumonia of both lower lobes due to gastric secretions ([place]) 12/24/2018  . Non-ST elevation (NSTEMI) myocardial infarction ([place])   . Chest pain   . Dyspnea on exertion   . Elevated troponin level 12/09/2018  . Chronic migraine 03/27/2018  . Lumbar radiculopathy 03/27/2018  . Gait abnormality 03/27/2018  . CKD (chronic kidney disease) stage 4, GFR 15-29 ml/min (HCC) 12/28/2016  . Symptomatic anemia 12/28/2016  . Chronic pain syndrome 12/28/2016  . Benzodiazepine withdrawal without complication ([place]) 71/69/6789  . Chronic right shoulder pain 12/06/2016  . Fall 09/17/2016  . Fracture of humeral head, closed, right, initial encounter 09/17/2016  . Rib fractures 09/17/2016  . Multiple falls 09/17/2016  . Severe muscle deconditioning 09/17/2016  . Failure to thrive in adult 09/17/2016  . Melena 04/25/2016  . Essential hypertension 04/25/2016  . Anxiety state 04/25/2016  . Tobacco abuse 04/25/2016  . Hyperlipidemia 04/25/2016  . Syncope 04/24/2016  . Syncope and collapse 04/24/2016  . Gait difficulty 01/12/2016  . Foot drop, left 01/12/2016  . Severe recurrent major depression without psychotic features ([place]) 08/28/2015  . Spondylolisthesis at L4-L5 level 07/30/2015  . PVD (peripheral vascular disease) with claudication ([place]) 07/29/2014  . Weakness-Bilateral arm/leg 07/29/2014  . Numbness-left leg 07/29/2014  . Swelling of limb-Legs 07/29/2014  . Atherosclerosis of native arteries of the extremities with intermittent claudication 09/30/2011    Past Surgical History:  Procedure Laterality Date  .  ABDOMINAL ANGIOGRAM N/A 10/29/2011   Procedure: ABDOMINAL ANGIOGRAM;  Surgeon: Elam Dutch, MD;  Location: [facility] CATH LAB;  Service: Cardiovascular;  Laterality: N/A;  . BACK SURGERY    . FEMORAL-FEMORAL BYPASS GRAFT  08/31/2010  . FLEXIBLE SIGMOIDOSCOPY N/A 06/12/2019   Procedure: FLEXIBLE SIGMOIDOSCOPY;  Surgeon: Ladene Artist, MD;   Location[facility] ENDOSCOPY;  Service: Endoscopy;  Laterality: N/A;  Patient had little bit to eat this morning so this will be unsedated.  Marland Kitchen JOINT REPLACEMENT Right 2012   Hip  . LOWER EXTREMITY ANGIOGRAPHY  06/14/2017   Procedure: Lower Extremity Angiography;  Surgeon: Adrian Prows, MD;  Location: [facility] CV LAB;  Service: Cardiovascular;;  Bilateral limited angio performed  . RENAL ANGIOGRAPHY N/A 06/14/2017   Procedure: Renal Angiography;  Surgeon: Adrian Prows, MD;  Location: [place] CV LAB;  Service: Cardiovascular;  Laterality: N/A;  . TOTAL HIP ARTHROPLASTY     right     OB History   No obstetric history on file.     Family History  Problem Relation Age of Onset  . Heart attack Mother   . Heart disease Mother   . Hyperlipidemia Mother   . Hypertension Mother   . Heart attack Father   . Heart disease Father        Before age 66  . Hyperlipidemia Father   . Hypertension Father   . Cancer Sister        brain tumor  . Aneurysm Brother        brain  . Colon cancer Neg Hx   . Liver cancer Neg Hx     Social History   Tobacco Use  . Smoking status: Former Smoker    Packs/day: 1.00    Years: 27.00    Pack years: 27.00    Types: Cigarettes    Quit date: 11/22/2018    Years since quitting: 0.9  . Smokeless tobacco: Never Used  Substance Use Topics  . Alcohol use: No  . Drug use: No    Home Medications Prior to Admission medications   Medication Sig Start Date End Date Taking? Authorizing Provider  acetaminophen (TYLENOL) 325 MG tablet Take 2 tablets (650 mg total) by mouth every 6 (six) hours as needed for mild pain, moderate pain or fever (or Fever >/= 101). 06/16/19   Hongalgi, Lenis Dickinson, MD  aspirin 81 MG EC tablet Take 1 tablet (81 mg total) by mouth daily. 01/09/19   Caren Griffins, MD  busPIRone (BUSPAR) 15 MG tablet Take 15 mg by mouth 3 (three) times daily as needed for anxiety. 02/28/19   [provider]  citalopram (CELEXA) 20 MG tablet Take 1 tablet  (20 mg total) by mouth daily. 03/23/19 03/22/20  Shelly Coss, MD  clopidogrel (PLAVIX) 75 MG tablet Take 1 tablet (75 mg total) by mouth daily. 01/09/19   Caren Griffins, MD  donepezil (ARICEPT) 5 MG tablet Take 5 mg by mouth daily.    [provider]  feeding supplement, ENSURE ENLIVE, (ENSURE ENLIVE) LIQD Take 237 mLs by mouth 2 (two) times daily between meals. 06/16/19   Hongalgi, Lenis Dickinson, MD  Ferrous Sulfate (IRON HIGH-POTENCY) 325 MG TABS Take 325 mg by mouth daily with breakfast. 06/30/19   Hongalgi, Lenis Dickinson, MD  isosorbide-hydrALAZINE (BIDIL) 20-37.5 MG tablet Take 2 tablets by mouth 3 (three) times daily.    [provider]  magnesium oxide (MAG-OX) 400 (241.3 Mg) MG tablet Take 1 tablet (400 mg total) by mouth  2 (two) times daily. Patient not taking: Reported on 06/22/2019 03/12/19   Flora Lipps, MD  megestrol (MEGACE) 40 MG tablet Take 40 mg by mouth 2 (two) times daily.    [provider]  metoprolol tartrate (LOPRESSOR) 25 MG tablet Take 2 tablets (50 mg total) by mouth 2 (two) times daily. 06/16/19   Hongalgi, Lenis Dickinson, MD  Multiple Vitamin (MULTIVITAMIN WITH MINERALS) TABS tablet Take 1 tablet by mouth daily. Patient not taking: Reported on 09/19/2019 06/17/19   Modena Jansky, MD  pantoprazole (PROTONIX) 40 MG tablet Take 1 tablet (40 mg total) by mouth daily. 06/16/19   Hongalgi, Lenis Dickinson, MD  rosuvastatin (CRESTOR) 40 MG tablet Take 1 tablet (40 mg total) by mouth at bedtime. 06/16/19   Hongalgi, Lenis Dickinson, MD  sodium bicarbonate 650 MG tablet Take 2 tablets (1,300 mg total) by mouth 2 (two) times daily with a meal. 09/22/19   Cristal Deer, MD  temazepam (RESTORIL) 15 MG capsule Take 15 mg by mouth at bedtime.    [provider]  valproic acid (DEPAKENE) 250 MG capsule Take 1 capsule (250 mg total) by mouth 2 (two) times daily. 01/09/19   Caren Griffins, MD    Allergies    Patient has no known allergies.  Review of Systems   Review of  Systems  Unable to perform ROS: Acuity of condition  Respiratory: Positive for shortness of breath.   Gastrointestinal: Positive for abdominal pain.    Physical Exam Updated Vital Signs BP (!) 177/97 (BP Location: Left Arm)   Pulse 98   Temp 97.6 F (36.4 C) (Oral)   Resp (!) 22   SpO2 100%   Physical Exam CONSTITUTIONAL: Elderly, ill-appearing HEAD: Normocephalic/atraumatic EYES: EOMI/PERRL ENMT: Mucous membranes moist, poor dentition NECK: supple no meningeal signs SPINE/BACK:entire spine nontender CV: S1/S2 noted, tachycardic LUNGS: Tachypnea, but lung sounds clear  ABDOMEN: soft, diffuse moderate tenderness GU:no cva tenderness NEURO: Pt is awake/alert, moves all extremitiesx4.  No facial droop.   EXTREMITIES: pulses normal/equal, full ROM, no lower extremity edema SKIN: warm, color normal PSYCH: Appears anxious ED Results / Procedures / Treatments   Labs (all labs ordered are listed, but only abnormal results are displayed) Labs Reviewed  CBC - Abnormal; Notable for the following components:      Result Value   RBC 3.15 (*)    Hemoglobin 9.4 (*)    HCT 29.6 (*)    RDW 18.6 (*)    Platelets 425 (*)    All other components within normal limits  BASIC METABOLIC PANEL - Abnormal; Notable for the following components:   Chloride 114 (*)    CO2 9 (*)    Glucose, Bld 139 (*)    BUN 46 (*)    Creatinine, Ser 2.56 (*)    GFR calc non Af Amer 18 (*)    GFR calc Af Amer 20 (*)    All other components within normal limits  URINALYSIS, ROUTINE W REFLEX MICROSCOPIC - Abnormal; Notable for the following components:   APPearance CLOUDY (*)    Protein, ur 100 (*)    Leukocytes,Ua LARGE (*)    WBC, UA >50 (*)    Bacteria, UA RARE (*)    All other components within normal limits  POCT I-STAT 7, (LYTES, BLD GAS, ICA,H+H) - Abnormal; Notable for the following components:   pH, Arterial 7.346 (*)    pCO2 arterial 17.6 (*)    Bicarbonate 9.7 (*)    TCO2 10 (*)  Acid-base deficit 14.0 (*)    HCT 26.0 (*)    Hemoglobin 8.8 (*)    All other components within normal limits  POCT I-STAT EG7 - Abnormal; Notable for the following components:   pCO2, Ven 19.8 (*)    pO2, Ven 139.0 (*)    Bicarbonate 10.1 (*)    TCO2 11 (*)    Acid-base deficit 14.0 (*)    Potassium 5.7 (*)    HCT 29.0 (*)    Hemoglobin 9.9 (*)    All other components within normal limits  RESPIRATORY PANEL BY RT PCR (FLU A&B, COVID)  CULTURE, BLOOD (ROUTINE X 2)  CULTURE, BLOOD (ROUTINE X 2)  URINE CULTURE  LACTIC ACID, PLASMA  APTT  PROTIME-INR  LIPASE, BLOOD  HEPATIC FUNCTION PANEL  LACTIC ACID, PLASMA    EKG EKG Interpretation  Date/Time:  Wednesday October 31 2019 16:47:45 EST Ventricular Rate:  101 PR Interval:  144 QRS Duration: 70 QT Interval:  372 QTC Calculation: 482 R Axis:   71 Text Interpretation: Sinus tachycardia Nonspecific ST and T wave abnormality Abnormal ECG No significant change since last tracing Confirmed by Ripley Fraise [phone]) on 11/01/2019 12:04:24 AM   Radiology CT ABDOMEN PELVIS WO CONTRAST  Result Date: 11/01/2019 CLINICAL DATA:  Abdominal distension diarrhea and chills EXAM: CT ABDOMEN AND PELVIS WITHOUT CONTRAST TECHNIQUE: Multidetector CT imaging of the abdomen and pelvis was performed following the standard protocol without IV contrast. COMPARISON:  June 07, 2019 FINDINGS: Lower chest: The visualized heart size within normal limits. No pericardial fluid/thickening. A small to moderate hiatal hernia is present. The visualized portions of the lungs are clear. Hepatobiliary: Although limited due to the lack of intravenous contrast, normal in appearance without gross focal abnormality. No evidence of calcified gallstones or biliary ductal dilatation. Pancreas:  Unremarkable.  No surrounding inflammatory changes. Spleen: Normal in size. Although limited due to the lack of intravenous contrast, normal in appearance. Adrenals/Urinary Tract:  Both adrenal glands appear normal. Again noted is slight atrophy of the right kidney. No renal or collecting system calculi are seen. The bladder is partially distended. Stomach/Bowel: The small bowel is normal in appearance. There is somewhat limited visualization of the distal colon, however there appears to be a moderate amount of colonic stool present. There is question of mild wall thickening seen at the hepatic flexure, series 3, image 30. No significant surrounding fat stranding changes however are noted. Scattered colonic diverticula are noted, however no definite evidence of surrounding fat stranding changes. Vascular/Lymphatic: There are no enlarged abdominal or pelvic lymph nodes. Scattered aortic atherosclerotic calcifications are seen without aneurysmal dilatation. Reproductive: The uterus and adnexa are unremarkable. Other: No evidence of abdominal wall mass or hernia. 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 The patient has had a prior posterior lumbar fixation at L4-L5 with a grade 1 anterolisthesis of L5 on S1. Increased sclerosis seen at the endplates of T0-W4. healed bilateral inferior pubic rami fractures are seen. IMPRESSION: 1. Somewhat limited examination 2. Question of mild wall thickening seen at the hepatic flexure which could be due to mild colitis. 3. Moderate hiatal hernia 4.  Aortic Atherosclerosis (ICD10-I70.0). Electronically Signed   By: Prudencio Pair M.D.   On: 11/01/2019 01:38   DG Chest 2 View  Result Date: 10/31/2019 CLINICAL DATA:  Shortness of chest pain for  several days EXAM: CHEST - 2 VIEW COMPARISON:  Radiograph 09/17/2019 FINDINGS: Patchy nodular opacities are present in the right lower lobe. No other consolidative airspace disease is seen. Lobular contour deformity of the left  hemidiaphragm may reflect a small eventration or Bochdalek's hernia. No pneumothorax or effusion. The cardiomediastinal contours are unremarkable. No acute osseous or soft tissue abnormality. Degenerative changes are present in the imaged spine and shoulders. IMPRESSION: 1. Patchy nodular opacities in the right lower lobe suspicious for pneumonia though recommend continued surveillance until resolution as underlying lesion cannot be excluded. 2. Lobular contour deformity of the left hemidiaphragm may reflect a small eventration or Bochdalek's hernia. Electronically Signed   By: Lovena Le M.D.   On: 10/31/2019 17:24    Procedures .Critical Care Performed by: Ripley Fraise, MD Authorized by: Ripley Fraise, MD   Critical care provider statement:    Critical care time (minutes):  60   Critical care start time:  11/01/2019 12:41 AM   Critical care end time:  11/01/2019 1:41 AM   Critical care was necessary to treat or prevent imminent or life-threatening deterioration of the following conditions:  Respiratory failure, renal failure, metabolic crisis and dehydration   Critical care was time spent personally by me on the following activities:  Ordering and performing treatments and interventions, ordering and review of laboratory studies, ordering and review of radiographic studies, pulse oximetry, re-evaluation of patient's condition, development of treatment plan with patient or surrogate, evaluation of patient's response to treatment, examination of patient, discussions with consultants and review of old charts   Medications Ordered in ED Medications  lactated ringers bolus 1,000 mL (1,000 mLs Intravenous New Bag/Given 11/01/19 0155)    And  lactated ringers bolus 500 mL (has no administration in time range)  metroNIDAZOLE (FLAGYL) IVPB 500 mg (500 mg Intravenous New Bag/Given 11/01/19 0232)  fentaNYL (SUBLIMAZE) injection 50 mcg (has no administration in time range)  cefTRIAXone (ROCEPHIN) 2  g in sodium chloride 0.9 % 100 mL IVPB (0 g Intravenous Stopped 11/01/19 0219)    ED Course  I have reviewed the triage vital signs and the nursing notes.  Pertinent labs & imaging results that were available during my care of the patient were reviewed by me and considered in my medical decision making (see chart for details).    MDM Rules/Calculators/A&P                      12:41 AM Patient initial complaint was shortness of breath.  However on my exam she has significant abdominal tenderness, she is tachypneic and does have worsening metabolic acidosis.  I'm concerned for an intra-abdominal emergency.  She denied vomiting or diarrhea on my exam.  She does report abdominal pain is new Sepsis protocol has been initiated.  Also spoke to son via the phone give him an update We will proceed with CT imaging. Chest x-ray shows questionable pneumonia 2:42 AM No acute findings on CT imaging.  Patient still tachypneic, but this likely is from her metabolic acidosis.  Patient was admitted in December for significant metabolic acidosis and at that time required a bicarb drip Patient receiving IV fluids at this time.  Once IV fluids are complete from sepsis protocol will consider bicarb drip.  Patient require admission Patient otherwise awake alert and talkative at this time 3:13 AM BP (!) 176/87   Pulse 94   Temp 97.9 F (36.6 C) (Rectal)   Resp 20   SpO2 100%  Discussed  the case with Dr. Hal Hope for admission Patient may benefit from a bicarb drip for her metabolic acidosis.  Patient has had episodes of nonanion gap metabolic acidosis on previous admissions. Patient benefit from admission for rehydration, IV antibiotics, and correction of metabolic abnormalities  Final Clinical Impression(s) / ED Diagnoses Final diagnoses:  Metabolic acidosis  Acute cystitis without hematuria  Dehydration    Rx / DC Orders ED Discharge Orders    None       Ripley Fraise, MD 11/01/19  [phone]

## 2019-11-01 NOTE — H&P (Signed)
History and Physical    [name] TDD:[id number] DOB: 1941/08/01 DOA: 10/31/2019  PCP: Loretha Brasil, FNP  Patient coming from: Home.  Chief Complaint: Shortness of breath.  History obtained from patient's son and patient.  HPI: Alyssa [name] is a 79 y.o. female with history of chronic kidney disease stage IV, peripheral vascular disease, hypertension, anemia admitted last month for dehydration and at that time patient also had a subarachnoid hemorrhage which was managed conservatively and was placed back on antiplatelet agents had become suddenly short of breath as per the patient's son yesterday.  Has not had any fever chills nausea vomiting or diarrhea.  ED Course: In the ER patient also was complaining of lower abdominal discomfort and states she has been having shortness of breath which has been there for last 2 days.  Chest x-ray shows possibility of pneumonia and UA is concerning for UTI.  CT abdomen and pelvis done shows features concerning hepatic flexure colitis.  Labs show negative Covid test and ABG was done which shows pH of 7.34 PCO2 70.6 PO2 of 97.  Hemoglobin was 9.5 WBC 10.4 EKG was sinus sinus tachycardia with bicarb of 9 and the metabolic panel creatinine of 2.5.  Patient was started on 2 L fluid bolus and started on empiric antibiotics and admitted for further management.  Review of Systems: As per HPI, rest all negative.   Past Medical History:  Diagnosis Date  . Anxiety   . Arthritis   . Chest pain   . Chronic kidney disease   . Elevated troponin 12/13/2018  . Headache(784.0)   . Hyperlipidemia   . Hypertension   . Joint pain   . Leg pain   . Peripheral neuropathy   . Peripheral vascular disease [facility])     Past Surgical History:  Procedure Laterality Date  . ABDOMINAL ANGIOGRAM N/A 10/29/2011   Procedure: ABDOMINAL ANGIOGRAM;  Surgeon: Elam Dutch, MD;  Location: [facility] CATH LAB;  Service: Cardiovascular;  Laterality: N/A;  . BACK  SURGERY    . FEMORAL-FEMORAL BYPASS GRAFT  08/31/2010  . FLEXIBLE SIGMOIDOSCOPY N/A 06/12/2019   Procedure: FLEXIBLE SIGMOIDOSCOPY;  Surgeon: Ladene Artist, MD;  Location: [facility] ENDOSCOPY;  Service: Endoscopy;  Laterality: N/A;  Patient had little bit to eat this morning so this will be unsedated.  Marland Kitchen JOINT REPLACEMENT Right 2012   Hip  . LOWER EXTREMITY ANGIOGRAPHY  06/14/2017   Procedure: Lower Extremity Angiography;  Surgeon: Adrian Prows, MD;  Location: [facility] CV LAB;  Service: Cardiovascular;;  Bilateral limited angio performed  . RENAL ANGIOGRAPHY N/A 06/14/2017   Procedure: Renal Angiography;  Surgeon: Adrian Prows, MD;  Location: [place] CV LAB;  Service: Cardiovascular;  Laterality: N/A;  . TOTAL HIP ARTHROPLASTY     right     reports that she quit smoking about 11 months ago. Her smoking use included cigarettes. She has a 27.00 pack-year smoking history. She has never used smokeless tobacco. She reports that she does not drink alcohol or use drugs.  No Known Allergies  Family History  Problem Relation Age of Onset  . Heart attack Mother   . Heart disease Mother   . Hyperlipidemia Mother   . Hypertension Mother   . Heart attack Father   . Heart disease Father        Before age 29  . Hyperlipidemia Father   . Hypertension Father   . Cancer Sister        brain tumor  .  Aneurysm Brother        brain  . Colon cancer Neg Hx   . Liver cancer Neg Hx     Prior to Admission medications   Medication Sig Start Date End Date Taking? Authorizing Provider  acetaminophen (TYLENOL) 325 MG tablet Take 2 tablets (650 mg total) by mouth every 6 (six) hours as needed for mild pain, moderate pain or fever (or Fever >/= 101). 06/16/19   Hongalgi, Lenis Dickinson, MD  aspirin 81 MG EC tablet Take 1 tablet (81 mg total) by mouth daily. 01/09/19   Caren Griffins, MD  busPIRone (BUSPAR) 15 MG tablet Take 15 mg by mouth 3 (three) times daily as needed for anxiety. 02/28/19   [provider]    citalopram (CELEXA) 20 MG tablet Take 1 tablet (20 mg total) by mouth daily. 03/23/19 03/22/20  Shelly Coss, MD  clopidogrel (PLAVIX) 75 MG tablet Take 1 tablet (75 mg total) by mouth daily. 01/09/19   Caren Griffins, MD  donepezil (ARICEPT) 5 MG tablet Take 5 mg by mouth daily.    [provider]  feeding supplement, ENSURE ENLIVE, (ENSURE ENLIVE) LIQD Take 237 mLs by mouth 2 (two) times daily between meals. 06/16/19   Hongalgi, Lenis Dickinson, MD  Ferrous Sulfate (IRON HIGH-POTENCY) 325 MG TABS Take 325 mg by mouth daily with breakfast. 06/30/19   Hongalgi, Lenis Dickinson, MD  isosorbide-hydrALAZINE (BIDIL) 20-37.5 MG tablet Take 2 tablets by mouth 3 (three) times daily.    [provider]  magnesium oxide (MAG-OX) 400 (241.3 Mg) MG tablet Take 1 tablet (400 mg total) by mouth 2 (two) times daily. Patient not taking: Reported on 06/22/2019 03/12/19   Flora Lipps, MD  megestrol (MEGACE) 40 MG tablet Take 40 mg by mouth 2 (two) times daily.    [provider]  metoprolol tartrate (LOPRESSOR) 25 MG tablet Take 2 tablets (50 mg total) by mouth 2 (two) times daily. 06/16/19   Hongalgi, Lenis Dickinson, MD  Multiple Vitamin (MULTIVITAMIN WITH MINERALS) TABS tablet Take 1 tablet by mouth daily. Patient not taking: Reported on 09/19/2019 06/17/19   Modena Jansky, MD  pantoprazole (PROTONIX) 40 MG tablet Take 1 tablet (40 mg total) by mouth daily. 06/16/19   Hongalgi, Lenis Dickinson, MD  rosuvastatin (CRESTOR) 40 MG tablet Take 1 tablet (40 mg total) by mouth at bedtime. 06/16/19   Hongalgi, Lenis Dickinson, MD  sodium bicarbonate 650 MG tablet Take 2 tablets (1,300 mg total) by mouth 2 (two) times daily with a meal. 09/22/19   Cristal Deer, MD  temazepam (RESTORIL) 15 MG capsule Take 15 mg by mouth at bedtime.    [provider]  valproic acid (DEPAKENE) 250 MG capsule Take 1 capsule (250 mg total) by mouth 2 (two) times daily. 01/09/19   Caren Griffins, MD    Physical Exam: Constitutional:  Moderately built and nourished. Vitals:   11/01/19 0230 11/01/19 0315 11/01/19 0330 11/01/19 0345  BP: (!) 176/87 (!) 171/93 (!) 165/89 (!) 162/92  Pulse: 94 94 96 (!) 103  Resp: 20 19 17 20   Temp:      TempSrc:      SpO2: 100% 100% 99% 99%   Eyes: Anicteric no pallor. ENMT: No discharge from the ears eyes nose or mouth. Neck: No mass felt.  No neck rigidity. Respiratory: No rhonchi or crepitations. Cardiovascular: S1-S2 heard. Abdomen: Soft nontender bowel sounds present. Musculoskeletal: No edema. Skin: No rash. Neurologic: Alert awake oriented to name and place moves  all extremities. Psychiatric: Appears normal.   Labs on Admission: I have personally reviewed following labs and imaging studies  CBC: Recent Labs  Lab 10/31/19 1647 11/01/19 0228 11/01/19 0247  WBC 10.5  --   --   HGB 9.4* 8.8* 9.9*  HCT 29.6* 26.0* 29.0*  MCV 94.0  --   --   PLT 425*  --   --    Basic Metabolic Panel: Recent Labs  Lab 10/31/19 1647 11/01/19 0228 11/01/19 0247  NA 136 140 139  K 4.7 5.1 5.7*  CL 114*  --   --   CO2 9*  --   --   GLUCOSE 139*  --   --   BUN 46*  --   --   CREATININE 2.56*  --   --   CALCIUM 9.7  --   --    GFR: CrCl cannot be calculated (Unknown ideal weight.). Liver Function Tests: Recent Labs  Lab 11/01/19 0155  AST 29  ALT 10  ALKPHOS 102  BILITOT 0.5  PROT 7.6  ALBUMIN 4.1   Recent Labs  Lab 11/01/19 0155  LIPASE 34   No results for input(s): AMMONIA in the last 168 hours. Coagulation Profile: Recent Labs  Lab 11/01/19 0155  INR 1.0   Cardiac Enzymes: No results for input(s): CKTOTAL, CKMB, CKMBINDEX, TROPONINI in the last 168 hours. BNP (last 3 results) No results for input(s): PROBNP in the last 8760 hours. HbA1C: No results for input(s): HGBA1C in the last 72 hours. CBG: No results for input(s): GLUCAP in the last 168 hours. Lipid Profile: No results for input(s): CHOL, HDL, LDLCALC, TRIG, CHOLHDL, LDLDIRECT in the last 72  hours. Thyroid Function Tests: No results for input(s): TSH, T4TOTAL, FREET4, T3FREE, THYROIDAB in the last 72 hours. Anemia Panel: No results for input(s): VITAMINB12, FOLATE, FERRITIN, TIBC, IRON, RETICCTPCT in the last 72 hours. Urine analysis:    Component Value Date/Time   COLORURINE YELLOW 11/01/2019 0108   APPEARANCEUR CLOUDY (A) 11/01/2019 0108   LABSPEC 1.012 11/01/2019 0108   PHURINE 5.0 11/01/2019 0108   GLUCOSEU NEGATIVE 11/01/2019 0108   HGBUR NEGATIVE 11/01/2019 0108   BILIRUBINUR NEGATIVE 11/01/2019 0108   KETONESUR NEGATIVE 11/01/2019 0108   PROTEINUR 100 (A) 11/01/2019 0108   UROBILINOGEN 0.2 08/24/2015 1737   NITRITE NEGATIVE 11/01/2019 0108   LEUKOCYTESUR LARGE (A) 11/01/2019 0108   Sepsis Labs: @LABRCNTIP (procalcitonin:4,lacticidven:4) )No results found for this or any previous visit (from the past 240 hour(s)).   Radiological Exams on Admission: CT ABDOMEN PELVIS WO CONTRAST  Result Date: 11/01/2019 CLINICAL DATA:  Abdominal distension diarrhea and chills EXAM: CT ABDOMEN AND PELVIS WITHOUT CONTRAST TECHNIQUE: Multidetector CT imaging of the abdomen and pelvis was performed following the standard protocol without IV contrast. COMPARISON:  June 07, 2019 FINDINGS: Lower chest: The visualized heart size within normal limits. No pericardial fluid/thickening. A small to moderate hiatal hernia is present. The visualized portions of the lungs are clear. Hepatobiliary: Although limited due to the lack of intravenous contrast, normal in appearance without gross focal abnormality. No evidence of calcified gallstones or biliary ductal dilatation. Pancreas:  Unremarkable.  No surrounding inflammatory changes. Spleen: Normal in size. Although limited due to the lack of intravenous contrast, normal in appearance. Adrenals/Urinary Tract: Both adrenal glands appear normal. Again noted is slight atrophy of the right kidney. No renal or collecting system calculi are seen. The bladder  is partially distended. Stomach/Bowel: The small bowel is normal in appearance. There is somewhat limited visualization  of the distal colon, however there appears to be a moderate amount of colonic stool present. There is question of mild wall thickening seen at the hepatic flexure, series 3, image 30. No significant surrounding fat stranding changes however are noted. Scattered colonic diverticula are noted, however no definite evidence of surrounding fat stranding changes. Vascular/Lymphatic: There are no enlarged abdominal or pelvic lymph nodes. Scattered aortic atherosclerotic calcifications are seen without aneurysmal dilatation. Reproductive: The uterus and adnexa are unremarkable. Other: No evidence of abdominal wall mass or hernia. 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 The patient has had a prior posterior lumbar fixation at L4-L5 with a grade 1 anterolisthesis of L5 on S1. Increased sclerosis seen at the endplates of J1-O8. healed bilateral inferior pubic rami fractures are seen. IMPRESSION: 1. Somewhat limited examination 2. Question of mild wall thickening seen at the hepatic flexure which could be due to mild colitis. 3. Moderate hiatal hernia 4.  Aortic Atherosclerosis (ICD10-I70.0). Electronically Signed   By: Prudencio Pair M.D.   On: 11/01/2019 01:38   DG Chest 2 View  Result Date: 10/31/2019 CLINICAL DATA:  Shortness of chest pain for several days EXAM: CHEST - 2 VIEW COMPARISON:  Radiograph 09/17/2019 FINDINGS: Patchy nodular opacities are present in the right lower lobe. No other consolidative airspace disease is seen. Lobular contour deformity of the left hemidiaphragm may reflect a small eventration or Bochdalek's hernia. No pneumothorax or effusion. The cardiomediastinal contours are unremarkable. No  acute osseous or soft tissue abnormality. Degenerative changes are present in the imaged spine and shoulders. IMPRESSION: 1. Patchy nodular opacities in the right lower lobe suspicious for pneumonia though recommend continued surveillance until resolution as underlying lesion cannot be excluded. 2. Lobular contour deformity of the left hemidiaphragm may reflect a small eventration or Bochdalek's hernia. Electronically Signed   By: Lovena Le M.D.   On: 10/31/2019 17:24    EKG: Independently reviewed.  Sinus tachycardia.  Assessment/Plan Principal Problem:   Acute respiratory failure with hypoxia (HCC) Active Problems:   Essential hypertension   CKD (chronic kidney disease) stage 4, GFR 15-29 ml/min (HCC)   DNR (do not resuscitate)   Dementia ([place])   Metabolic acidosis   Acute lower UTI   CAP (community acquired pneumonia)    1. Acute respiratory failure with hypoxia cause not clear.  Chest x-ray shows possible pneumonia for which patient is on empiric antibiotics.  Patient does have a significant non-anion gap metabolic acidosis which may be contributing.  Not sure.  I have ordered a VQ scan to make sure there is no pulmonary embolism.  Covid test was negative. 2. UTI and colitis for which patient on empiric antibiotics.  Closely observe. 3. Non-anion gap metabolic acidosis chronic kidney disease stage IV on sodium bicarbonate tablets.  Patient receiving fluids.  Recheck metabolic panel and may get nephrology input. 4. 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  On iron supplements. 5. Hypertension on BiDil and hydralazine. 6. History of depression on Depakote and Celexa. 7. History of peripheral vascular disease on statins and antiplatelet agents.  Given the acute respiratory failure with none anion gap metabolic acidosis will need inpatient status.   DVT prophylaxis: Heparin. Code Status: DNR confirmed with patient's son. Family Communication: Patient's son. Disposition  Plan: To be determined. Consults called: None. Admission status: Inpatient.  Rise Patience MD [facility] Pager [phone].  If 7PM-7AM, please contact night-coverage [email or web address] Password [facility]  11/01/2019, 3:54 AM

## 2019-11-01 NOTE — ED Notes (Signed)
Son updated. Pt also requested that her friend [name] be updated, message was left for [name] to call back

## 2019-11-01 NOTE — Plan of Care (Signed)
°  Problem: Coping: °Goal: Level of anxiety will decrease °Outcome: Progressing °  °

## 2019-11-01 NOTE — ED Notes (Addendum)
Attending notified regarding pain medication for pt's 9/10 headache.

## 2019-11-01 NOTE — ED Notes (Signed)
Pt returned from CT °

## 2019-11-01 NOTE — ED Notes (Signed)
[phone] [name] pts caregiver/ significant other wants an update

## 2019-11-01 NOTE — ED Notes (Signed)
Secure message sent to pharmacy in regards to missing medications.

## 2019-11-01 NOTE — ED Notes (Signed)
Pt transported to Nuclear Medicine for scan.

## 2019-11-02 DIAGNOSIS — Z66 Do not resuscitate: Secondary | ICD-10-CM

## 2019-11-02 DIAGNOSIS — N390 Urinary tract infection, site not specified: Secondary | ICD-10-CM

## 2019-11-02 DIAGNOSIS — N184 Chronic kidney disease, stage 4 (severe): Secondary | ICD-10-CM

## 2019-11-02 LAB — CBC
HCT: 22.8 % — ABNORMAL LOW (ref 36.0–46.0)
Hemoglobin: 7.6 g/dL — ABNORMAL LOW (ref 12.0–15.0)
MCH: 29.9 pg (ref 26.0–34.0)
MCHC: 33.3 g/dL (ref 30.0–36.0)
MCV: 89.8 fL (ref 80.0–100.0)
Platelets: 346 10*3/uL (ref 150–400)
RBC: 2.54 MIL/uL — ABNORMAL LOW (ref 3.87–5.11)
RDW: 18.4 % — ABNORMAL HIGH (ref 11.5–15.5)
WBC: 7.9 10*3/uL (ref 4.0–10.5)
nRBC: 0 % (ref 0.0–0.2)

## 2019-11-02 LAB — BASIC METABOLIC PANEL
Anion gap: 8 (ref 5–15)
BUN: 40 mg/dL — ABNORMAL HIGH (ref 8–23)
CO2: 15 mmol/L — ABNORMAL LOW (ref 22–32)
Calcium: 8.8 mg/dL — ABNORMAL LOW (ref 8.9–10.3)
Chloride: 112 mmol/L — ABNORMAL HIGH (ref 98–111)
Creatinine, Ser: 1.94 mg/dL — ABNORMAL HIGH (ref 0.44–1.00)
GFR calc Af Amer: 28 mL/min — ABNORMAL LOW (ref >60)
GFR calc non Af Amer: 25 mL/min — ABNORMAL LOW (ref >60)
Glucose, Bld: 89 mg/dL (ref 70–99)
Potassium: 5.5 mmol/L — ABNORMAL HIGH (ref 3.5–5.1)
Sodium: 135 mmol/L (ref 135–145)

## 2019-11-02 LAB — HEMOGLOBIN AND HEMATOCRIT, BLOOD
HCT: 22.4 % — ABNORMAL LOW (ref 36.0–46.0)
Hemoglobin: 7.5 g/dL — ABNORMAL LOW (ref 12.0–15.0)

## 2019-11-02 MED ORDER — SODIUM ZIRCONIUM CYCLOSILICATE 10 G PO PACK
10.0000 g | PACK | Freq: Every day | ORAL | Status: DC
  Administered 2019-11-02 – 2019-11-03 (×2): 10 g via ORAL
  Filled 2019-11-02 (×2): qty 1

## 2019-11-02 NOTE — Progress Notes (Signed)
PROGRESS NOTE    [name]   CVE:[id number]  DOB: 02-Jun-1941  DOA: 10/31/2019 PCP: Loretha Brasil, FNP   Brief Narrative:  Alyssa [name] is a 79 y.o. female with history of chronic kidney disease stage IV, peripheral vascular disease, hypertension, anemia admitted last month for dehydration and at that time patient also had a subarachnoid hemorrhage which was managed conservatively and was placed back on antiplatelet agents had become suddenly short of breath as per the patient's son yesterday.  Has not had any fever chills nausea vomiting or diarrhea.  CXR in ED > Patchy nodular opacities in the right lower lobe suspicious for pneumonia   CT abd/pelvis: Question of mild wall thickening seen at the hepatic flexure which could be due to mild colitis.    Subjective: No complaints.   Assessment & Plan:   Principal Problem:   Acute respiratory failure with hypoxia/ pneumonia - VQ negative - cont IV antibiotics  Active Problems:   Acute lower UTI  - Large leukocytes and rare bacteremia- check Urine culture  Hyperkalemia - start Lokelma    Essential hypertension - Bidil, Lopressor    Metabolic acidosis and  CKD (chronic kidney disease) stage 4, GFR 15-29 ml/min - stable - cont Sodium Bicarb    Dementia (HCC) - Aricept, Buspar, Celexa, Restoril and Depakote   Time spent in minutes: 35 DVT prophylaxis: Heparin Code Status: DNR Family Communication:  Disposition Plan: home Consultants:   none Procedures:   none Antimicrobials:  Anti-infectives (From admission, onward)   Start     Dose/Rate Route Frequency Ordered Stop   11/01/19 2200  cefTRIAXone (ROCEPHIN) 2 g in sodium chloride 0.9 % 100 mL IVPB     2 g 200 mL/hr over 30 Minutes Intravenous Every 24 hours 11/01/19 0728     11/01/19 0800  azithromycin (ZITHROMAX) 500 mg in sodium chloride 0.9 % 250 mL IVPB     500 mg 250 mL/hr over 60 Minutes Intravenous Every 24 hours 11/01/19 0728 11/06/19  0759   11/01/19 0030  cefTRIAXone (ROCEPHIN) 2 g in sodium chloride 0.9 % 100 mL IVPB     2 g 200 mL/hr over 30 Minutes Intravenous  Once 11/01/19 0025 11/01/19 0219   11/01/19 0030  metroNIDAZOLE (FLAGYL) IVPB 500 mg     500 mg 100 mL/hr over 60 Minutes Intravenous  Once 11/01/19 0025 11/01/19 0341       Objective: Vitals:   11/01/19 2100 11/01/19 2203 11/02/19 0526 11/02/19 0825  BP:  131/75 (!) 152/72 (!) 142/79  Pulse:  82 63 76  Resp:  16 20 18   Temp:  99.2 F (37.3 C) 98.2 F (36.8 C) 98.6 F (37 C)  TempSrc:  Oral Oral Oral  SpO2:  99% 96% 98%  Weight: 48.4 kg     Height:        Intake/Output Summary (Last 24 hours) at 11/02/2019 1538 Last data filed at 11/02/2019 1300 Gross per 24 hour  Intake 2295.81 ml  Output 350 ml  Net 1945.81 ml   Filed Weights   11/01/19 0659 11/01/19 2100  Weight: 44.3 kg 48.4 kg    Examination: General exam: Appears comfortable  HEENT: PERRLA, oral mucosa moist, no sclera icterus or thrush Respiratory system: Clear to auscultation. Respiratory effort normal. Cardiovascular system: S1 & S2 heard, RRR.   Gastrointestinal system: Abdomen soft, non-tender, nondistended. Normal bowel sounds. Central nervous system: Alert and oriented. No focal neurological deficits. Extremities: No cyanosis, clubbing or edema Skin: No  rashes or ulcers Psychiatry:  Mood & affect appropriate.     Data Reviewed: I have personally reviewed following labs and imaging studies  CBC: Recent Labs  Lab 10/31/19 1647 11/01/19 0228 11/01/19 0352 11/01/19 0352 11/01/19 0819 11/01/19 1457 11/01/19 2306 11/02/19 0618 11/02/19 1425  WBC 10.5  --  10.4  --  7.8  --   --  7.9  --   NEUTROABS  --   --   --   --  5.1  --   --   --   --   HGB 9.4*   < > 9.5*   < > 7.1* 8.3* 7.3* 7.6* 7.5*  HCT 29.6*   < > 30.7*   < > 22.6* 26.1* 22.3* 22.8* 22.4*  MCV 94.0  --  95.6  --  94.6  --   --  89.8  --   PLT 425*  --  456*  --  359  --   --  346  --    < > =  values in this interval not displayed.   Basic Metabolic Panel: Recent Labs  Lab 10/31/19 1647 11/01/19 0228 11/01/19 0247 11/01/19 0352 11/01/19 0819 11/02/19 0618  NA 136 140 139  --  139 135  K 4.7 5.1 5.7*  --  5.3* 5.5*  CL 114*  --   --   --  114* 112*  CO2 9*  --   --   --  12* 15*  GLUCOSE 139*  --   --   --  104* 89  BUN 46*  --   --   --  44* 40*  CREATININE 2.56*  --   --  2.72* 2.20* 1.94*  CALCIUM 9.7  --   --   --  9.6 8.8*   GFR: Estimated Creatinine Clearance: 15.9 mL/min (A) (by C-G formula based on SCr of 1.94 mg/dL (H)). Liver Function Tests: Recent Labs  Lab 11/01/19 0155 11/01/19 0819  AST 29 21  ALT 10 10  ALKPHOS 102 90  BILITOT 0.5 0.4  PROT 7.6 6.5  ALBUMIN 4.1 3.6   Recent Labs  Lab 11/01/19 0155  LIPASE 34   No results for input(s): AMMONIA in the last 168 hours. Coagulation Profile: Recent Labs  Lab 11/01/19 0155  INR 1.0   Cardiac Enzymes: No results for input(s): CKTOTAL, CKMB, CKMBINDEX, TROPONINI in the last 168 hours. BNP (last 3 results) No results for input(s): PROBNP in the last 8760 hours. HbA1C: No results for input(s): HGBA1C in the last 72 hours. CBG: No results for input(s): GLUCAP in the last 168 hours. Lipid Profile: No results for input(s): CHOL, HDL, LDLCALC, TRIG, CHOLHDL, LDLDIRECT in the last 72 hours. Thyroid Function Tests: No results for input(s): TSH, T4TOTAL, FREET4, T3FREE, THYROIDAB in the last 72 hours. Anemia Panel: Recent Labs    11/01/19 1456 11/01/19 1457  VITAMINB12 688  --   FOLATE 14.6  --   FERRITIN 557*  --   TIBC 263  --   IRON 56  --   RETICCTPCT  --  2.7   Urine analysis:    Component Value Date/Time   COLORURINE YELLOW 11/01/2019 0108   APPEARANCEUR CLOUDY (A) 11/01/2019 0108   LABSPEC 1.012 11/01/2019 0108   PHURINE 5.0 11/01/2019 0108   GLUCOSEU NEGATIVE 11/01/2019 0108   HGBUR NEGATIVE 11/01/2019 0108   BILIRUBINUR NEGATIVE 11/01/2019 0108   KETONESUR NEGATIVE  11/01/2019 0108   PROTEINUR 100 (A) 11/01/2019 0108   UROBILINOGEN 0.2  08/24/2015 1737   NITRITE NEGATIVE 11/01/2019 0108   LEUKOCYTESUR LARGE (A) 11/01/2019 0108   Sepsis Labs: @LABRCNTIP (procalcitonin:4,lacticidven:4) ) Recent Results (from the past 240 hour(s))  Urine culture     Status: Abnormal (Preliminary result)   Collection Time: 11/01/19 12:25 AM   Specimen: In/Out Cath Urine  Result Value Ref Range Status   Specimen Description IN/OUT CATH URINE  Final   Special Requests NONE  Final   Culture (A)  Final    >=100,000 COLONIES/mL ESCHERICHIA COLI SUSCEPTIBILITIES TO FOLLOW Performed at [facility] [address] [place], [place] [address]    Report Status PENDING  Incomplete  Respiratory Panel by RT PCR (Flu A&B, Covid) - Nasopharyngeal Swab     Status: None   Collection Time: 11/01/19  2:40 AM   Specimen: Nasopharyngeal Swab  Result Value Ref Range Status   SARS Coronavirus 2 by RT PCR NEGATIVE NEGATIVE Final    Comment: (NOTE) SARS-CoV-2 target nucleic acids are NOT DETECTED. The SARS-CoV-2 RNA is generally detectable in upper respiratoy specimens during the acute phase of infection. The lowest concentration of SARS-CoV-2 viral copies this assay can detect is 131 copies/mL. A negative result does not preclude SARS-Cov-2 infection and should not be used as the sole basis for treatment or other patient management decisions. A negative result may occur with  improper specimen collection/handling, submission of specimen other than nasopharyngeal swab, presence of viral mutation(s) within the areas targeted by this assay, and inadequate number of viral copies (<131 copies/mL). A negative result must be combined with clinical observations, patient history, and epidemiological information. The expected result is Negative. Fact Sheet for Patients:  PinkCheek.be Fact Sheet for Healthcare Providers:    GravelBags.it This test is not yet ap proved or cleared by the [place] FDA and  has been authorized for detection and/or diagnosis of SARS-CoV-2 by FDA under an Emergency Use Authorization (EUA). This EUA will remain  in effect (meaning this test can be used) for the duration of the COVID-19 declaration under Section 564(b)(1) of the Act, 21 U.S.C. section 360bbb-3(b)(1), unless the authorization is terminated or revoked sooner.    Influenza A by PCR NEGATIVE NEGATIVE Final   Influenza B by PCR NEGATIVE NEGATIVE Final    Comment: (NOTE) The Xpert Xpress SARS-CoV-2/FLU/RSV assay is intended as an aid in  the diagnosis of influenza from Nasopharyngeal swab specimens and  should not be used as a sole basis for treatment. Nasal washings and  aspirates are unacceptable for Xpert Xpress SARS-CoV-2/FLU/RSV  testing. Fact Sheet for Patients: PinkCheek.be Fact Sheet for Healthcare Providers: GravelBags.it This test is not yet approved or cleared by the [place] FDA and  has been authorized for detection and/or diagnosis of SARS-CoV-2 by  FDA under an Emergency Use Authorization (EUA). This EUA will remain  in effect (meaning this test can be used) for the duration of the  Covid-19 declaration under Section 564(b)(1) of the Act, 21  U.S.C. section 360bbb-3(b)(1), unless the authorization is  terminated or revoked. Performed at [facility], [place] [address] [place], [place] [address]          Radiology Studies: CT ABDOMEN PELVIS WO CONTRAST  Result Date: 11/01/2019 CLINICAL DATA:  Abdominal distension diarrhea and chills EXAM: CT ABDOMEN AND PELVIS WITHOUT CONTRAST TECHNIQUE: Multidetector CT imaging of the abdomen and pelvis was performed following the standard protocol without IV contrast. COMPARISON:  June 07, 2019 FINDINGS: Lower chest: The visualized heart size within normal  limits. No pericardial fluid/thickening. A small to moderate hiatal hernia is present. The visualized portions of the lungs are clear. Hepatobiliary: Although limited due to the lack of intravenous contrast, normal in appearance without gross focal abnormality. No evidence of calcified gallstones or biliary ductal dilatation. Pancreas:  Unremarkable.  No surrounding inflammatory changes. Spleen: Normal in size. Although limited due to the lack of intravenous contrast, normal in appearance. Adrenals/Urinary Tract: Both adrenal glands appear normal. Again noted is slight atrophy of the right kidney. No renal or collecting system calculi are seen. The bladder is partially distended. Stomach/Bowel: The small bowel is normal in appearance. There is somewhat limited visualization of the distal colon, however there appears to be a moderate amount of colonic stool present. There is question of mild wall thickening seen at the hepatic flexure, series 3, image 30. No significant surrounding fat stranding changes however are noted. Scattered colonic diverticula are noted, however no definite evidence of surrounding fat stranding changes. Vascular/Lymphatic: There are no enlarged abdominal or pelvic lymph nodes. Scattered aortic atherosclerotic calcifications are seen without aneurysmal dilatation. Reproductive: The uterus and adnexa are unremarkable. Other: No evidence of abdominal wall mass or hernia. 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 The patient has had a prior posterior lumbar fixation at L4-L5 with a grade 1 anterolisthesis of L5 on S1. Increased sclerosis seen at the endplates of O8-C1. healed bilateral inferior pubic rami fractures are seen. IMPRESSION: 1. Somewhat limited examination 2. Question of mild wall thickening seen at  the hepatic flexure which could be due to mild colitis. 3. Moderate hiatal hernia 4.  Aortic Atherosclerosis (ICD10-I70.0). Electronically Signed   By: Prudencio Pair M.D.   On: 11/01/2019 01:38   DG Chest 2 View  Result Date: 10/31/2019 CLINICAL DATA:  Shortness of chest pain for several days EXAM: CHEST - 2 VIEW COMPARISON:  Radiograph 09/17/2019 FINDINGS: Patchy nodular opacities are present in the right lower lobe. No other consolidative airspace disease is seen. Lobular contour deformity of the left hemidiaphragm may reflect a small eventration or Bochdalek's hernia. No pneumothorax or effusion. The cardiomediastinal contours are unremarkable. No acute osseous or soft tissue abnormality. Degenerative changes are present in the imaged spine and shoulders. IMPRESSION: 1. Patchy nodular opacities in the right lower lobe suspicious for pneumonia though recommend continued surveillance until resolution as underlying lesion cannot be excluded. 2. Lobular contour deformity of the left hemidiaphragm may reflect a small eventration or Bochdalek's hernia. Electronically Signed   By: Lovena Le M.D.   On: 10/31/2019 17:24   NM Pulmonary Perf and Vent  Result Date: 11/01/2019 CLINICAL DATA:  Chronic kidney disease. Abnormal x-ray. Pleural effusion. Sudden onset of shortness of breath. EXAM: NUCLEAR MEDICINE PERFUSION LUNG SCAN TECHNIQUE: Perfusion images were obtained in multiple projections after intravenous injection of radiopharmaceutical. Ventilation scans intentionally deferred if perfusion scan and chest x-ray adequate for interpretation during COVID 19 epidemic. RADIOPHARMACEUTICALS:  1.6 mCi Tc-57m MAA IV COMPARISON:  12/10/2018 FINDINGS: No suspicious segmental perfusion defects identified to suggest acute pulmonary embolus. Soft tissue attenuation artifact is identified, particularly on the lateral projection images as the patient was imaged with arms by her side. IMPRESSION: 1. No evidence for acute  pulmonary embolus. Electronically Signed   By: Kerby Moors M.D.   On: 11/01/2019 14:19      Scheduled Meds: .  aspirin EC  81 mg Oral Daily  . citalopram  20 mg Oral Daily  . clopidogrel  75 mg Oral Daily  . donepezil  5 mg Oral Daily  . feeding supplement (ENSURE ENLIVE)  237 mL Oral BID BM  . ferrous sulfate  325 mg Oral Q breakfast  . heparin  5,000 Units Subcutaneous Q8H  . isosorbide-hydrALAZINE  2 tablet Oral TID  . megestrol  40 mg Oral BID  . metoprolol tartrate  50 mg Oral BID  . pantoprazole  40 mg Oral Daily  . rosuvastatin  40 mg Oral QHS  . sodium bicarbonate  1,300 mg Oral BID WC  . sodium zirconium cyclosilicate  10 g Oral Daily  . temazepam  15 mg Oral QHS  . valproic acid  250 mg Oral BID   Continuous Infusions: . azithromycin 500 mg (11/02/19 0759)  . cefTRIAXone (ROCEPHIN)  IV 2 g (11/01/19 2236)     LOS: 1 day      Debbe Odea, MD [facility] Pager: [email or web address] Password [facility] 11/02/2019, 3:38 PM

## 2019-11-02 NOTE — Plan of Care (Signed)
  Problem: Health Behavior/Discharge Planning: Goal: Ability to manage health-related needs will improve Outcome: Progressing   

## 2019-11-03 LAB — URINE CULTURE: Culture: >=100000 — AB

## 2019-11-03 MED ORDER — AMOXICILLIN-POT CLAVULANATE 500-125 MG PO TABS
1.0000 | ORAL_TABLET | Freq: Two times a day (BID) | ORAL | Status: DC
  Filled 2019-11-03: qty 1

## 2019-11-03 MED ORDER — AZITHROMYCIN 500 MG PO TABS
500.0000 mg | ORAL_TABLET | Freq: Every day | ORAL | Status: DC
  Administered 2019-11-04: 500 mg via ORAL
  Filled 2019-11-03: qty 1

## 2019-11-03 MED ORDER — SODIUM BICARBONATE 650 MG PO TABS
650.0000 mg | ORAL_TABLET | Freq: Three times a day (TID) | ORAL | Status: DC
  Administered 2019-11-03 – 2019-11-04 (×4): 650 mg via ORAL
  Filled 2019-11-03 (×4): qty 1

## 2019-11-03 MED ORDER — SODIUM ZIRCONIUM CYCLOSILICATE 10 G PO PACK
10.0000 g | PACK | Freq: Three times a day (TID) | ORAL | Status: DC
  Administered 2019-11-03 – 2019-11-04 (×3): 10 g via ORAL
  Filled 2019-11-03 (×3): qty 1

## 2019-11-03 MED ORDER — CEFDINIR 300 MG PO CAPS
300.0000 mg | ORAL_CAPSULE | Freq: Two times a day (BID) | ORAL | Status: DC
  Administered 2019-11-03 – 2019-11-04 (×3): 300 mg via ORAL
  Filled 2019-11-03 (×4): qty 1

## 2019-11-03 NOTE — Progress Notes (Signed)
PROGRESS NOTE    Alyssa [name]   JSH:[id number]  DOB: [birth year]  DOA: 10/31/2019 PCP: Loretha Brasil, FNP   Brief Narrative:  Alyssa [name] is a 79 y.o. female with history of chronic kidney disease stage IV, peripheral vascular disease, hypertension, anemia admitted last month for dehydration and at that time patient also had a subarachnoid hemorrhage which was managed conservatively, was placed back on antiplatelet agents who presents for shortness of breath, diarrhea, abdominal pain and chills.    CXR in ED > Patchy nodular opacities in the right lower lobe suspicious for pneumonia   CT abd/pelvis: Question of mild wall thickening seen at the hepatic flexure which could be due to mild colitis.    Subjective: No complaints.   Assessment & Plan:  Principal Problem:   Acute respiratory failure with tachypnea/ RLL pneumonia - VQ negative - lost her IV today- In a lot of pain while RN was trying to get an IV in her hand- will change IV antibiotics to oral today> Augmentin and Azithromycin  Active Problems:   Acute lower UTI  - Large leukocytes and rare bacteremia-  Urine culture > 100 K gram neg rods  Diarrhea - ? Colitis on CT- abd pain and diarrhea improved-   Hyperkalemia in setting of AKI - K 5.5 even after Lokelma- will increase dose of Lokelma today   Non anion gap Metabolic acidosis  AKI on CKD stage 4, GFR 15-29 ml/min - Bicarb improved from 9 on admission to 15 today  -  start oral Bicarb tabs - Cr improving slowly-     Dementia (HCC) - Aricept, Buspar, Celexa, Restoril and Depakote    Essential hypertension - Bidil, Lopressor   Time spent in minutes: 35 DVT prophylaxis: Heparin Code Status: DNR Family Communication:  Disposition Plan: home after acidosis, Cr and K + have improved Consultants:   none Procedures:   none Antimicrobials:  Anti-infectives (From admission, onward)   Start     Dose/Rate Route Frequency Ordered Stop     11/04/19 1000  azithromycin (ZITHROMAX) tablet 500 mg     500 mg Oral Daily 11/03/19 1233     11/03/19 2200  amoxicillin-clavulanate (AUGMENTIN) 500-125 MG per tablet 500 mg     1 tablet Oral 2 times daily 11/03/19 1233     11/01/19 2200  cefTRIAXone (ROCEPHIN) 2 g in sodium chloride 0.9 % 100 mL IVPB  Status:  Discontinued     2 g 200 mL/hr over 30 Minutes Intravenous Every 24 hours 11/01/19 0728 11/03/19 1233   11/01/19 0800  azithromycin (ZITHROMAX) 500 mg in sodium chloride 0.9 % 250 mL IVPB  Status:  Discontinued     500 mg 250 mL/hr over 60 Minutes Intravenous Every 24 hours 11/01/19 0728 11/03/19 1233   11/01/19 0030  cefTRIAXone (ROCEPHIN) 2 g in sodium chloride 0.9 % 100 mL IVPB     2 g 200 mL/hr over 30 Minutes Intravenous  Once 11/01/19 0025 11/01/19 0219   11/01/19 0030  metroNIDAZOLE (FLAGYL) IVPB 500 mg     500 mg 100 mL/hr over 60 Minutes Intravenous  Once 11/01/19 0025 11/01/19 0341       Objective: Vitals:   11/02/19 2113 11/02/19 2114 11/03/19 0526 11/03/19 0900  BP:  (!) 147/79 (!) 161/85 (!) 160/78  Pulse:  82 79 80  Resp:  12 16 18   Temp:  98.2 F (36.8 C) 99.1 F (37.3 C) 98.6 F (37 C)  TempSrc:  Oral Oral Oral  SpO2:  99% 96% 98%  Weight: 52 kg     Height:        Intake/Output Summary (Last 24 hours) at 11/03/2019 1431 Last data filed at 11/03/2019 1221 Gross per 24 hour  Intake 640 ml  Output 904 ml  Net -264 ml   Filed Weights   11/01/19 0659 11/01/19 2100 11/02/19 2113  Weight: 44.3 kg 48.4 kg 52 kg    Examination: General exam: Appears comfortable  HEENT: PERRLA, oral mucosa moist, no sclera icterus or thrush Respiratory system: Clear to auscultation. Respiratory effort normal. Cardiovascular system: S1 & S2 heard,  No murmurs  Gastrointestinal system: Abdomen soft, non-tender, nondistended. Normal bowel sounds   Central nervous system: Alert and oriented. No focal neurological deficits. Extremities: No cyanosis, clubbing or  edema Skin: No rashes or ulcers Psychiatry:  Mood & affect appropriate.     Data Reviewed: I have personally reviewed following labs and imaging studies  CBC: Recent Labs  Lab 10/31/19 1647 11/01/19 0228 11/01/19 0352 11/01/19 0352 11/01/19 0819 11/01/19 1457 11/01/19 2306 11/02/19 0618 11/02/19 1425  WBC 10.5  --  10.4  --  7.8  --   --  7.9  --   NEUTROABS  --   --   --   --  5.1  --   --   --   --   HGB 9.4*   < > 9.5*   < > 7.1* 8.3* 7.3* 7.6* 7.5*  HCT 29.6*   < > 30.7*   < > 22.6* 26.1* 22.3* 22.8* 22.4*  MCV 94.0  --  95.6  --  94.6  --   --  89.8  --   PLT 425*  --  456*  --  359  --   --  346  --    < > = values in this interval not displayed.   Basic Metabolic Panel: Recent Labs  Lab 10/31/19 1647 11/01/19 0228 11/01/19 0247 11/01/19 0352 11/01/19 0819 11/02/19 0618  NA 136 140 139  --  139 135  K 4.7 5.1 5.7*  --  5.3* 5.5*  CL 114*  --   --   --  114* 112*  CO2 9*  --   --   --  12* 15*  GLUCOSE 139*  --   --   --  104* 89  BUN 46*  --   --   --  44* 40*  CREATININE 2.56*  --   --  2.72* 2.20* 1.94*  CALCIUM 9.7  --   --   --  9.6 8.8*   GFR: Estimated Creatinine Clearance: 17.6 mL/min (A) (by C-G formula based on SCr of 1.94 mg/dL (H)). Liver Function Tests: Recent Labs  Lab 11/01/19 0155 11/01/19 0819  AST 29 21  ALT 10 10  ALKPHOS 102 90  BILITOT 0.5 0.4  PROT 7.6 6.5  ALBUMIN 4.1 3.6   Recent Labs  Lab 11/01/19 0155  LIPASE 34   No results for input(s): AMMONIA in the last 168 hours. Coagulation Profile: Recent Labs  Lab 11/01/19 0155  INR 1.0   Cardiac Enzymes: No results for input(s): CKTOTAL, CKMB, CKMBINDEX, TROPONINI in the last 168 hours. BNP (last 3 results) No results for input(s): PROBNP in the last 8760 hours. HbA1C: No results for input(s): HGBA1C in the last 72 hours. CBG: No results for input(s): GLUCAP in the last 168 hours. Lipid Profile: No results for input(s): CHOL, HDL, LDLCALC, TRIG, CHOLHDL,  LDLDIRECT in the  last 72 hours. Thyroid Function Tests: No results for input(s): TSH, T4TOTAL, FREET4, T3FREE, THYROIDAB in the last 72 hours. Anemia Panel: Recent Labs    11/01/19 1456 11/01/19 1457  VITAMINB12 688  --   FOLATE 14.6  --   FERRITIN 557*  --   TIBC 263  --   IRON 56  --   RETICCTPCT  --  2.7   Urine analysis:    Component Value Date/Time   COLORURINE YELLOW 11/01/2019 0108   APPEARANCEUR CLOUDY (A) 11/01/2019 0108   LABSPEC 1.012 11/01/2019 0108   PHURINE 5.0 11/01/2019 0108   GLUCOSEU NEGATIVE 11/01/2019 0108   HGBUR NEGATIVE 11/01/2019 0108   BILIRUBINUR NEGATIVE 11/01/2019 0108   KETONESUR NEGATIVE 11/01/2019 0108   PROTEINUR 100 (A) 11/01/2019 0108   UROBILINOGEN 0.2 08/24/2015 1737   NITRITE NEGATIVE 11/01/2019 0108   LEUKOCYTESUR LARGE (A) 11/01/2019 0108   Sepsis Labs: @LABRCNTIP (procalcitonin:4,lacticidven:4) ) Recent Results (from the past 240 hour(s))  Urine culture     Status: Abnormal   Collection Time: 11/01/19 12:25 AM   Specimen: In/Out Cath Urine  Result Value Ref Range Status   Specimen Description IN/OUT CATH URINE  Final   Special Requests   Final    NONE Performed at [facility], [place] [address] [place], [place] [address]    Culture >=100,000 COLONIES/mL ESCHERICHIA COLI (A)  Final   Report Status 11/03/2019 FINAL  Final   Organism ID, Bacteria ESCHERICHIA COLI (A)  Final      Susceptibility   Escherichia coli - MIC*    AMPICILLIN >=32 RESISTANT Resistant     CEFAZOLIN <=4 SENSITIVE Sensitive     CEFTRIAXONE <=0.25 SENSITIVE Sensitive     CIPROFLOXACIN <=0.25 SENSITIVE Sensitive     GENTAMICIN <=1 SENSITIVE Sensitive     IMIPENEM <=0.25 SENSITIVE Sensitive     NITROFURANTOIN <=16 SENSITIVE Sensitive     TRIMETH/SULFA <=20 SENSITIVE Sensitive     AMPICILLIN/SULBACTAM >=32 RESISTANT Resistant     PIP/TAZO <=4 SENSITIVE Sensitive     * >=100,000 COLONIES/mL ESCHERICHIA COLI  Blood Culture (routine x 2)     Status:  None (Preliminary result)   Collection Time: 11/01/19  1:40 AM   Specimen: BLOOD  Result Value Ref Range Status   Specimen Description BLOOD RIGHT ARM  Final   Special Requests   Final    BOTTLES DRAWN AEROBIC AND ANAEROBIC Blood Culture results may not be optimal due to an inadequate volume of blood received in culture bottles   Culture   Final    NO GROWTH 2 DAYS Performed at [place] [address], [place], [place] [address]    Report Status PENDING  Incomplete  Respiratory Panel by RT PCR (Flu A&B, Covid) - Nasopharyngeal Swab     Status: None   Collection Time: 11/01/19  2:40 AM   Specimen: Nasopharyngeal Swab  Result Value Ref Range Status   SARS Coronavirus 2 by RT PCR NEGATIVE NEGATIVE Final    Comment: (NOTE) SARS-CoV-2 target nucleic acids are NOT DETECTED. The SARS-CoV-2 RNA is generally detectable in upper respiratoy specimens during the acute phase of infection. The lowest concentration of SARS-CoV-2 viral copies this assay can detect is 131 copies/mL. A negative result does not preclude SARS-Cov-2 infection and should not be used as the sole basis for treatment or other patient management decisions. A negative result may occur with  improper specimen collection/handling, submission of specimen other than nasopharyngeal swab, presence of viral mutation(s) within  the areas targeted by this assay, and inadequate number of viral copies (<131 copies/mL). A negative result must be combined with clinical observations, patient history, and epidemiological information. The expected result is Negative. Fact Sheet for Patients:  PinkCheek.be Fact Sheet for Healthcare Providers:  GravelBags.it This test is not yet ap proved or cleared by the [place] FDA and  has been authorized for detection and/or diagnosis of SARS-CoV-2 by FDA under an Emergency Use Authorization (EUA). This EUA will remain  in effect  (meaning this test can be used) for the duration of the COVID-19 declaration under Section 564(b)(1) of the Act, 21 U.S.C. section 360bbb-3(b)(1), unless the authorization is terminated or revoked sooner.    Influenza A by PCR NEGATIVE NEGATIVE Final   Influenza B by PCR NEGATIVE NEGATIVE Final    Comment: (NOTE) The Xpert Xpress SARS-CoV-2/FLU/RSV assay is intended as an aid in  the diagnosis of influenza from Nasopharyngeal swab specimens and  should not be used as a sole basis for treatment. Nasal washings and  aspirates are unacceptable for Xpert Xpress SARS-CoV-2/FLU/RSV  testing. Fact Sheet for Patients: PinkCheek.be Fact Sheet for Healthcare Providers: GravelBags.it This test is not yet approved or cleared by the [place] FDA and  has been authorized for detection and/or diagnosis of SARS-CoV-2 by  FDA under an Emergency Use Authorization (EUA). This EUA will remain  in effect (meaning this test can be used) for the duration of the  Covid-19 declaration under Section 564(b)(1) of the Act, 21  U.S.C. section 360bbb-3(b)(1), unless the authorization is  terminated or revoked. Performed at [facility], [place] [address], [place], [place] [address]   Blood Culture (routine x 2)     Status: None (Preliminary result)   Collection Time: 11/01/19  8:56 AM   Specimen: BLOOD RIGHT HAND  Result Value Ref Range Status   Specimen Description BLOOD RIGHT HAND  Final   Special Requests   Final    BOTTLES DRAWN AEROBIC ONLY Blood Culture adequate volume   Culture   Final    NO GROWTH 2 DAYS Performed at [facility], [place] [address] [place], [place] [address]    Report Status PENDING  Incomplete         Radiology Studies: No results found.    Scheduled Meds: . amoxicillin-clavulanate  1 tablet Oral BID  . aspirin EC  81 mg Oral Daily  . [START ON 11/04/2019] azithromycin  500 mg Oral Daily  .  citalopram  20 mg Oral Daily  . clopidogrel  75 mg Oral Daily  . donepezil  5 mg Oral Daily  . feeding supplement (ENSURE ENLIVE)  237 mL Oral BID BM  . ferrous sulfate  325 mg Oral Q breakfast  . heparin  5,000 Units Subcutaneous Q8H  . isosorbide-hydrALAZINE  2 tablet Oral TID  . megestrol  40 mg Oral BID  . metoprolol tartrate  50 mg Oral BID  . pantoprazole  40 mg Oral Daily  . rosuvastatin  40 mg Oral QHS  . sodium bicarbonate  650 mg Oral TID  . sodium zirconium cyclosilicate  10 g Oral TID  . temazepam  15 mg Oral QHS  . valproic acid  250 mg Oral BID   Continuous Infusions:    LOS: 2 days      Debbe Odea, MD [facility] Pager: [email or web address] Password [facility] 11/03/2019, 2:31 PM

## 2019-11-04 DIAGNOSIS — E860 Dehydration: Secondary | ICD-10-CM

## 2019-11-04 DIAGNOSIS — F0280 Dementia in other diseases classified elsewhere without behavioral disturbance: Secondary | ICD-10-CM

## 2019-11-04 LAB — CBC
HCT: 22.9 % — ABNORMAL LOW (ref 36.0–46.0)
Hemoglobin: 7.5 g/dL — ABNORMAL LOW (ref 12.0–15.0)
MCH: 29.8 pg (ref 26.0–34.0)
MCHC: 32.8 g/dL (ref 30.0–36.0)
MCV: 90.9 fL (ref 80.0–100.0)
Platelets: 341 10*3/uL (ref 150–400)
RBC: 2.52 MIL/uL — ABNORMAL LOW (ref 3.87–5.11)
RDW: 17.8 % — ABNORMAL HIGH (ref 11.5–15.5)
WBC: 6.5 10*3/uL (ref 4.0–10.5)
nRBC: 0 % (ref 0.0–0.2)

## 2019-11-04 LAB — BASIC METABOLIC PANEL
Anion gap: 11 (ref 5–15)
BUN: 27 mg/dL — ABNORMAL HIGH (ref 8–23)
CO2: 19 mmol/L — ABNORMAL LOW (ref 22–32)
Calcium: 8.8 mg/dL — ABNORMAL LOW (ref 8.9–10.3)
Chloride: 105 mmol/L (ref 98–111)
Creatinine, Ser: 1.61 mg/dL — ABNORMAL HIGH (ref 0.44–1.00)
GFR calc Af Amer: 36 mL/min — ABNORMAL LOW (ref >60)
GFR calc non Af Amer: 31 mL/min — ABNORMAL LOW (ref >60)
Glucose, Bld: 96 mg/dL (ref 70–99)
Potassium: 4.3 mmol/L (ref 3.5–5.1)
Sodium: 135 mmol/L (ref 135–145)

## 2019-11-04 NOTE — Discharge Summary (Signed)
Physician Discharge Summary  TERRIN IMPARATO GHW:[id number] DOB: [birth year] DOA: 10/31/2019  PCP: Loretha Brasil, FNP  Admit date: 10/31/2019 Discharge date: 11/04/2019  Admitted From: home  Disposition:  home   Recommendations for Outpatient Follow-up:  1. [facility] in 1-2 wks  Home Health:  ordered  Discharge Condition:  stable   CODE STATUS:  DNF   Diet recommendation:  Heart healthy Consultations:  none    Discharge Diagnoses:  Principal Problem:   Acute respiratory failure with hypoxia -  CAP (community acquired pneumonia) Active Problems:   Metabolic acidosis   Acute lower UTI   Essential hypertension   AKI- CKD (chronic kidney disease) stage 4, GFR 15-29 ml/min (HCC)   DNR (do not resuscitate)   Dementia ([place])       Brief Summary: [name] a 79 y.o.femalewithhistory of chronic kidney disease stage IV, peripheral vascular disease, hypertension, anemia admitted last month for dehydration and at that time patient also had a subarachnoid hemorrhage which was managed conservatively, was placed back on antiplatelet agents who presents for shortness of breath, diarrhea, abdominal pain and chills.    CXR in ED > Patchy nodular opacities in the right lower lobe suspicious for pneumonia   CT abd/pelvis: Question of mild wall thickening seen at the hepatic flexure which could be due to mild colitis.  Hospital Course:  Principal Problem: Acute respiratory failure with tachypnea/ RLL pneumonia - VQ negative - treated with Ceftriaxone and azithromycin x 5 day- I feel she has received adequate treatment and does not need further antibiotics   Active Problems: dehydration> Non anion gapMetabolic acidosis AKI onCKD stage 4, GFR 15-29 ml/min - Bicarb improved from 9 on admission to 19  - Cr improving slowly from 2.72 to 1.6 - family will need to be very vigilant at home about oral intake to prevent dehydration- she may be forgetting to drink due  to her dementia  Acute lower UTI - Large leukocytes and rare bacteremia-  Urine culture > 100 K e coli- treated with Ceftriaxone  Diarrhea - ? Colitis on CT- abd pain and diarrhea improved- interestingly she continues to state she has diarrhea but RNs have not noted any diarrhea at all over the past 3 days - suspect her dementia is playing a part in this  Hyperkalemia in setting of AKI - K 5.5 even after Lokelma- improved after increase in dose    Dementia - mod-severe - Aricept, Buspar, Celexa, Restoril and Depakote  Essential hypertension - Bidil, Lopressor   Discharge Exam: Vitals:   11/04/19 0508 11/04/19 0934  BP: (!) 149/89 (!) 141/80  Pulse: 80 68  Resp: 18 18  Temp: 98 F (36.7 C) 98.4 F (36.9 C)  SpO2: 98% 96%   Vitals:   11/03/19 1726 11/03/19 2049 11/04/19 0508 11/04/19 0934  BP: (!) 159/96 (!) 150/92 (!) 149/89 (!) 141/80  Pulse: 77 83 80 68  Resp: 18 18 18 18   Temp: 98.2 F (36.8 C) 98.6 F (37 C) 98 F (36.7 C) 98.4 F (36.9 C)  TempSrc: Oral Oral Oral Oral  SpO2: 98% 96% 98% 96%  Weight:   52 kg   Height:        General: Pt is alert, awake, not in acute distress Cardiovascular: RRR, S1/S2 +, no rubs, no gallops Respiratory: CTA bilaterally, no wheezing, no rhonchi Abdominal: Soft, NT, ND, bowel sounds + Extremities: no edema, no cyanosis   Discharge Instructions  Discharge Instructions    Diet - low sodium  heart healthy   Complete by: As directed    Increase activity slowly   Complete by: As directed      Allergies as of 11/04/2019   No Known Allergies     Medication List    TAKE these medications   acetaminophen 325 MG tablet Commonly known as: TYLENOL Take 2 tablets (650 mg total) by mouth every 6 (six) hours as needed for mild pain, moderate pain or fever (or Fever >/= 101).   aspirin 81 MG EC tablet Take 1 tablet (81 mg total) by mouth daily.   busPIRone 15 MG tablet Commonly known as: BUSPAR Take 15 mg by mouth  3 (three) times daily as needed for anxiety.   citalopram 20 MG tablet Commonly known as: CeleXA Take 1 tablet (20 mg total) by mouth daily.   clopidogrel 75 MG tablet Commonly known as: PLAVIX Take 1 tablet (75 mg total) by mouth daily.   diclofenac Sodium 1 % Gel Commonly known as: VOLTAREN Apply 4 g topically 3 (three) times daily as needed for pain.   donepezil 5 MG tablet Commonly known as: ARICEPT Take 5 mg by mouth daily.   feeding supplement (ENSURE ENLIVE) Liqd Take 237 mLs by mouth 2 (two) times daily between meals.   gabapentin 100 MG capsule Commonly known as: NEURONTIN Take 100 mg by mouth 3 (three) times daily.   Iron High-Potency 325 MG Tabs Take 325 mg by mouth daily with breakfast.   isosorbide-hydrALAZINE 20-37.5 MG tablet Commonly known as: BIDIL Take 2 tablets by mouth 3 (three) times daily.   magnesium oxide 400 (241.3 Mg) MG tablet Commonly known as: MAG-OX Take 1 tablet (400 mg total) by mouth 2 (two) times daily.   megestrol 40 MG tablet Commonly known as: MEGACE Take 40 mg by mouth 2 (two) times daily.   metoprolol tartrate 25 MG tablet Commonly known as: LOPRESSOR Take 2 tablets (50 mg total) by mouth 2 (two) times daily.   multivitamin with minerals Tabs tablet Take 1 tablet by mouth daily.   pantoprazole 40 MG tablet Commonly known as: PROTONIX Take 1 tablet (40 mg total) by mouth daily.   rosuvastatin 40 MG tablet Commonly known as: CRESTOR Take 1 tablet (40 mg total) by mouth at bedtime.   sodium bicarbonate 650 MG tablet Take 2 tablets (1,300 mg total) by mouth 2 (two) times daily with a meal.   temazepam 15 MG capsule Commonly known as: RESTORIL Take 15 mg by mouth at bedtime.   valproic acid 250 MG capsule Commonly known as: DEPAKENE Take 1 capsule (250 mg total) by mouth 2 (two) times daily.       No Known Allergies   Procedures/Studies:    CT ABDOMEN PELVIS WO CONTRAST  Result Date: 11/01/2019 CLINICAL  DATA:  Abdominal distension diarrhea and chills EXAM: CT ABDOMEN AND PELVIS WITHOUT CONTRAST TECHNIQUE: Multidetector CT imaging of the abdomen and pelvis was performed following the standard protocol without IV contrast. COMPARISON:  June 07, 2019 FINDINGS: Lower chest: The visualized heart size within normal limits. No pericardial fluid/thickening. A small to moderate hiatal hernia is present. The visualized portions of the lungs are clear. Hepatobiliary: Although limited due to the lack of intravenous contrast, normal in appearance without gross focal abnormality. No evidence of calcified gallstones or biliary ductal dilatation. Pancreas:  Unremarkable.  No surrounding inflammatory changes. Spleen: Normal in size. Although limited due to the lack of intravenous contrast, normal in appearance. Adrenals/Urinary Tract: Both adrenal glands appear normal. Again  noted is slight atrophy of the right kidney. No renal or collecting system calculi are seen. The bladder is partially distended. Stomach/Bowel: The small bowel is normal in appearance. There is somewhat limited visualization of the distal colon, however there appears to be a moderate amount of colonic stool present. There is question of mild wall thickening seen at the hepatic flexure, series 3, image 30. No significant surrounding fat stranding changes however are noted. Scattered colonic diverticula are noted, however no definite evidence of surrounding fat stranding changes. Vascular/Lymphatic: There are no enlarged abdominal or pelvic lymph nodes. Scattered aortic atherosclerotic calcifications are seen without aneurysmal dilatation. Reproductive: The uterus and adnexa are unremarkable. Other: No evidence of abdominal wall mass or hernia. 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 The patient has had a prior posterior lumbar fixation at L4-L5 with a grade 1 anterolisthesis of L5 on S1. Increased sclerosis seen at the endplates of H4-V4. healed bilateral inferior pubic rami fractures are seen. IMPRESSION: 1. Somewhat limited examination 2. Question of mild wall thickening seen at the hepatic flexure which could be due to mild colitis. 3. Moderate hiatal hernia 4.  Aortic Atherosclerosis (ICD10-I70.0). Electronically Signed   By: Prudencio Pair M.D.   On: 11/01/2019 01:38   DG Chest 2 View  Result Date: 10/31/2019 CLINICAL DATA:  Shortness of chest pain for several days EXAM: CHEST - 2 VIEW COMPARISON:  Radiograph 09/17/2019 FINDINGS: Patchy nodular opacities are present in the right lower lobe. No other consolidative airspace disease is seen. Lobular contour deformity of the left hemidiaphragm may reflect a small eventration or Bochdalek's hernia. No pneumothorax or effusion. The cardiomediastinal contours are unremarkable. No acute osseous or soft tissue abnormality. Degenerative changes are present in the imaged spine and shoulders. IMPRESSION: 1. Patchy nodular opacities in the right lower lobe suspicious for pneumonia though recommend continued surveillance until resolution as underlying lesion cannot be excluded. 2. Lobular contour deformity of the left hemidiaphragm may reflect a small eventration or Bochdalek's hernia. Electronically Signed   By: Lovena Le M.D.   On: 10/31/2019 17:24   NM Pulmonary Perf and Vent  Result Date: 11/01/2019 CLINICAL DATA:  Chronic kidney disease. Abnormal x-ray. Pleural effusion. Sudden onset of shortness of breath. EXAM: NUCLEAR MEDICINE PERFUSION LUNG SCAN TECHNIQUE: Perfusion images were obtained in multiple projections after intravenous injection of radiopharmaceutical. Ventilation scans intentionally deferred if perfusion scan and chest x-ray adequate for interpretation during COVID 19 epidemic. RADIOPHARMACEUTICALS:  1.6 mCi Tc-67m  MAA IV COMPARISON:  12/10/2018 FINDINGS: No suspicious segmental perfusion defects identified to suggest acute pulmonary embolus. Soft tissue attenuation artifact is identified, particularly on the lateral projection images as the patient was imaged with arms by her side. IMPRESSION: 1. No evidence for acute pulmonary embolus. Electronically Signed   By: Kerby Moors M.D.   On: 11/01/2019 14:19     The results of significant diagnostics from this hospitalization (including imaging, microbiology, ancillary and laboratory) are listed below for reference.     Microbiology: Recent Results (from the past 240 hour(s))  Urine culture     Status: Abnormal   Collection Time: 11/01/19 12:25 AM   Specimen: In/Out Cath Urine  Result Value Ref Range Status   Specimen Description IN/OUT CATH URINE  Final   Special Requests   Final    NONE Performed at  [facility], [place] [address], [place], [place] [address]    Culture >=100,000 COLONIES/mL ESCHERICHIA COLI (A)  Final   Report Status 11/03/2019 FINAL  Final   Organism ID, Bacteria ESCHERICHIA COLI (A)  Final      Susceptibility   Escherichia coli - MIC*    AMPICILLIN >=32 RESISTANT Resistant     CEFAZOLIN <=4 SENSITIVE Sensitive     CEFTRIAXONE <=0.25 SENSITIVE Sensitive     CIPROFLOXACIN <=0.25 SENSITIVE Sensitive     GENTAMICIN <=1 SENSITIVE Sensitive     IMIPENEM <=0.25 SENSITIVE Sensitive     NITROFURANTOIN <=16 SENSITIVE Sensitive     TRIMETH/SULFA <=20 SENSITIVE Sensitive     AMPICILLIN/SULBACTAM >=32 RESISTANT Resistant     PIP/TAZO <=4 SENSITIVE Sensitive     * >=100,000 COLONIES/mL ESCHERICHIA COLI  Blood Culture (routine x 2)     Status: None (Preliminary result)   Collection Time: 11/01/19  1:40 AM   Specimen: BLOOD  Result Value Ref Range Status   Specimen Description BLOOD RIGHT ARM  Final   Special Requests   Final    BOTTLES DRAWN AEROBIC AND ANAEROBIC Blood Culture results may not be optimal due to an inadequate volume  of blood received in culture bottles   Culture   Final    NO GROWTH 3 DAYS Performed at [facility] [address], [place], [place] [address]    Report Status PENDING  Incomplete  Respiratory Panel by RT PCR (Flu A&B, Covid) - Nasopharyngeal Swab     Status: None   Collection Time: 11/01/19  2:40 AM   Specimen: Nasopharyngeal Swab  Result Value Ref Range Status   SARS Coronavirus 2 by RT PCR NEGATIVE NEGATIVE Final    Comment: (NOTE) SARS-CoV-2 target nucleic acids are NOT DETECTED. The SARS-CoV-2 RNA is generally detectable in upper respiratoy specimens during the acute phase of infection. The lowest concentration of SARS-CoV-2 viral copies this assay can detect is 131 copies/mL. A negative result does not preclude SARS-Cov-2 infection and should not be used as the sole basis for treatment or other patient management decisions. A negative result may occur with  improper specimen collection/handling, submission of specimen other than nasopharyngeal swab, presence of viral mutation(s) within the areas targeted by this assay, and inadequate number of viral copies (<131 copies/mL). A negative result must be combined with clinical observations, patient history, and epidemiological information. The expected result is Negative. Fact Sheet for Patients:  PinkCheek.be Fact Sheet for Healthcare Providers:  GravelBags.it This test is not yet ap proved or cleared by the [place] FDA and  has been authorized for detection and/or diagnosis of SARS-CoV-2 by FDA under an Emergency Use Authorization (EUA). This EUA will remain  in effect (meaning this test can be used) for the duration of the COVID-19 declaration under Section 564(b)(1) of the Act, 21 U.S.C. section 360bbb-3(b)(1), unless the authorization is terminated or revoked sooner.    Influenza A by PCR NEGATIVE NEGATIVE Final   Influenza B by PCR NEGATIVE NEGATIVE  Final    Comment: (NOTE) The Xpert Xpress SARS-CoV-2/FLU/RSV assay is intended as an aid in  the diagnosis of influenza from Nasopharyngeal swab specimens and  should not be used as a sole basis for treatment. Nasal washings and  aspirates are unacceptable for Xpert Xpress SARS-CoV-2/FLU/RSV  testing. Fact Sheet for Patients: PinkCheek.be Fact Sheet for Healthcare Providers: GravelBags.it This test is not yet approved or cleared by the [place] FDA and  has been  authorized for detection and/or diagnosis of SARS-CoV-2 by  FDA under an Emergency Use Authorization (EUA). This EUA will remain  in effect (meaning this test can be used) for the duration of the  Covid-19 declaration under Section 564(b)(1) of the Act, 21  U.S.C. section 360bbb-3(b)(1), unless the authorization is  terminated or revoked. Performed at [facility], [place] [address] [place], [place] [address]   Blood Culture (routine x 2)     Status: None (Preliminary result)   Collection Time: 11/01/19  8:56 AM   Specimen: BLOOD RIGHT HAND  Result Value Ref Range Status   Specimen Description BLOOD RIGHT HAND  Final   Special Requests   Final    BOTTLES DRAWN AEROBIC ONLY Blood Culture adequate volume   Culture   Final    NO GROWTH 3 DAYS Performed at [facility], [place] [address], [place], [place] [address]    Report Status PENDING  Incomplete     Labs: BNP (last 3 results) Recent Labs    12/10/18 0618 12/11/18 0424 03/22/19 2128  BNP 744.1* 471.1* 301.6*   Basic Metabolic Panel: Recent Labs  Lab 10/31/19 1647 10/31/19 1647 11/01/19 0228 11/01/19 0247 11/01/19 0352 11/01/19 0819 11/02/19 0618 11/04/19 0424  NA 136   < > 140 139  --  139 135 135  K 4.7   < > 5.1 5.7*  --  5.3* 5.5* 4.3  CL 114*  --   --   --   --  114* 112* 105  CO2 9*  --   --   --   --  12* 15* 19*  GLUCOSE 139*  --   --   --   --  104* 89 96  BUN 46*  --   --    --   --  44* 40* 27*  CREATININE 2.56*  --   --   --  2.72* 2.20* 1.94* 1.61*  CALCIUM 9.7  --   --   --   --  9.6 8.8* 8.8*   < > = values in this interval not displayed.   Liver Function Tests: Recent Labs  Lab 11/01/19 0155 11/01/19 0819  AST 29 21  ALT 10 10  ALKPHOS 102 90  BILITOT 0.5 0.4  PROT 7.6 6.5  ALBUMIN 4.1 3.6   Recent Labs  Lab 11/01/19 0155  LIPASE 34   No results for input(s): AMMONIA in the last 168 hours. CBC: Recent Labs  Lab 10/31/19 1647 11/01/19 0228 11/01/19 0352 11/01/19 0352 11/01/19 0819 11/01/19 0819 11/01/19 1457 11/01/19 2306 11/02/19 0618 11/02/19 1425 11/04/19 0424  WBC 10.5  --  10.4  --  7.8  --   --   --  7.9  --  6.5  NEUTROABS  --   --   --   --  5.1  --   --   --   --   --   --   HGB 9.4*   < > 9.5*   < > 7.1*   < > 8.3* 7.3* 7.6* 7.5* 7.5*  HCT 29.6*   < > 30.7*   < > 22.6*   < > 26.1* 22.3* 22.8* 22.4* 22.9*  MCV 94.0  --  95.6  --  94.6  --   --   --  89.8  --  90.9  PLT 425*  --  456*  --  359  --   --   --  346  --  341   < > = values in this interval not displayed.   Cardiac Enzymes: No results for input(s): CKTOTAL, CKMB, CKMBINDEX, TROPONINI in the last 168 hours. BNP: Invalid input(s): POCBNP CBG: No results for input(s): GLUCAP in the last 168 hours. D-Dimer No results for input(s): DDIMER in the last 72 hours. Hgb A1c No results for input(s): HGBA1C in the last 72 hours. Lipid Profile No results for input(s): CHOL, HDL, LDLCALC, TRIG, CHOLHDL, LDLDIRECT in the last 72 hours. Thyroid function studies No results for input(s): TSH, T4TOTAL, T3FREE, THYROIDAB in the last 72 hours.  Invalid input(s): FREET3 Anemia work up Recent Labs    11/01/19 1456 11/01/19 1457  VITAMINB12 688  --   FOLATE 14.6  --   FERRITIN 557*  --   TIBC 263  --   IRON 56  --   RETICCTPCT  --  2.7   Urinalysis    Component Value Date/Time   COLORURINE YELLOW 11/01/2019 0108   APPEARANCEUR CLOUDY (A) 11/01/2019 0108    LABSPEC 1.012 11/01/2019 0108   PHURINE 5.0 11/01/2019 0108   GLUCOSEU NEGATIVE 11/01/2019 0108   HGBUR NEGATIVE 11/01/2019 0108   BILIRUBINUR NEGATIVE 11/01/2019 0108   KETONESUR NEGATIVE 11/01/2019 0108   PROTEINUR 100 (A) 11/01/2019 0108   UROBILINOGEN 0.2 08/24/2015 1737   NITRITE NEGATIVE 11/01/2019 0108   LEUKOCYTESUR LARGE (A) 11/01/2019 0108   Sepsis Labs Invalid input(s): PROCALCITONIN,  WBC,  LACTICIDVEN Microbiology Recent Results (from the past 240 hour(s))  Urine culture     Status: Abnormal   Collection Time: 11/01/19 12:25 AM   Specimen: In/Out Cath Urine  Result Value Ref Range Status   Specimen Description IN/OUT CATH URINE  Final   Special Requests   Final    NONE Performed at [facility], [address] [place], [place] [address]    Culture >=100,000 COLONIES/mL ESCHERICHIA COLI (A)  Final   Report Status 11/03/2019 FINAL  Final   Organism ID, Bacteria ESCHERICHIA COLI (A)  Final      Susceptibility   Escherichia coli - MIC*    AMPICILLIN >=32 RESISTANT Resistant     CEFAZOLIN <=4 SENSITIVE Sensitive     CEFTRIAXONE <=0.25 SENSITIVE Sensitive     CIPROFLOXACIN <=0.25 SENSITIVE Sensitive     GENTAMICIN <=1 SENSITIVE Sensitive     IMIPENEM <=0.25 SENSITIVE Sensitive     NITROFURANTOIN <=16 SENSITIVE Sensitive     TRIMETH/SULFA <=20 SENSITIVE Sensitive     AMPICILLIN/SULBACTAM >=32 RESISTANT Resistant     PIP/TAZO <=4 SENSITIVE Sensitive     * >=100,000 COLONIES/mL ESCHERICHIA COLI  Blood Culture (routine x 2)     Status: None (Preliminary result)   Collection Time: 11/01/19  1:40 AM   Specimen: BLOOD  Result Value Ref Range Status   Specimen Description BLOOD RIGHT ARM  Final   Special Requests   Final    BOTTLES DRAWN AEROBIC AND ANAEROBIC Blood Culture results may not be optimal due to an inadequate volume of blood received in culture bottles   Culture   Final    NO GROWTH 3 DAYS Performed at [facility] [address]  [place],  [address]    Report Status PENDING  Incomplete  Respiratory Panel by RT PCR (Flu A&B, Covid) - Nasopharyngeal Swab     Status: None   Collection Time: 11/01/19  2:40 AM   Specimen: Nasopharyngeal Swab  Result Value Ref Range Status   SARS Coronavirus 2 by RT PCR  NEGATIVE NEGATIVE Final    Comment: (NOTE) SARS-CoV-2 target nucleic acids are NOT DETECTED. The SARS-CoV-2 RNA is generally detectable in upper respiratoy specimens during the acute phase of infection. The lowest concentration of SARS-CoV-2 viral copies this assay can detect is 131 copies/mL. A negative result does not preclude SARS-Cov-2 infection and should not be used as the sole basis for treatment or other patient management decisions. A negative result may occur with  improper specimen collection/handling, submission of specimen other than nasopharyngeal swab, presence of viral mutation(s) within the areas targeted by this assay, and inadequate number of viral copies (<131 copies/mL). A negative result must be combined with clinical observations, patient history, and epidemiological information. The expected result is Negative. Fact Sheet for Patients:  PinkCheek.be Fact Sheet for Healthcare Providers:  GravelBags.it This test is not yet ap proved or cleared by the [place] FDA and  has been authorized for detection and/or diagnosis of SARS-CoV-2 by FDA under an Emergency Use Authorization (EUA). This EUA will remain  in effect (meaning this test can be used) for the duration of the COVID-19 declaration under Section 564(b)(1) of the Act, 21 U.S.C. section 360bbb-3(b)(1), unless the authorization is terminated or revoked sooner.    Influenza A by PCR NEGATIVE NEGATIVE Final   Influenza B by PCR NEGATIVE NEGATIVE Final    Comment: (NOTE) The Xpert Xpress SARS-CoV-2/FLU/RSV assay is intended as an aid in  the diagnosis of influenza from  Nasopharyngeal swab specimens and  should not be used as a sole basis for treatment. Nasal washings and  aspirates are unacceptable for Xpert Xpress SARS-CoV-2/FLU/RSV  testing. Fact Sheet for Patients: PinkCheek.be Fact Sheet for Healthcare Providers: GravelBags.it This test is not yet approved or cleared by the [place] FDA and  has been authorized for detection and/or diagnosis of SARS-CoV-2 by  FDA under an Emergency Use Authorization (EUA). This EUA will remain  in effect (meaning this test can be used) for the duration of the  Covid-19 declaration under Section 564(b)(1) of the Act, 21  U.S.C. section 360bbb-3(b)(1), unless the authorization is  terminated or revoked. Performed at [facility], [place] [address] [place], [place] [address]   Blood Culture (routine x 2)     Status: None (Preliminary result)   Collection Time: 11/01/19  8:56 AM   Specimen: BLOOD RIGHT HAND  Result Value Ref Range Status   Specimen Description BLOOD RIGHT HAND  Final   Special Requests   Final    BOTTLES DRAWN AEROBIC ONLY Blood Culture adequate volume   Culture   Final    NO GROWTH 3 DAYS Performed at [facility], [place] [address], [place], [place] [address]    Report Status PENDING  Incomplete     Time coordinating discharge in minutes: 33  SIGNED:   Debbe Odea, MD  [facility] 11/04/2019, 1:23 PM Pager   If 7PM-7AM, please contact night-coverage [email or web address] Password [email or web address]

## 2019-11-04 NOTE — TOC Transition Note (Signed)
Transition of Care [facility]) - CM/SW Discharge Note   Patient Details  Name: Alyssa [name] MRN: [id number] Date of Birth: 08-13-1941  Transition of Care [facility]) CM/SW Contact:  Carles Collet, RN Phone Number: 11/04/2019, 2:18 PM   Clinical Narrative:   Damaris Schooner w patient about dispo plan. She is agreeable to [facility]. She chose [place]. Referral accepted. No other CM needs identified.  She states family will be able to transport her.     Final next level of care: [place] Barriers to Discharge: No Barriers Identified   Patient Goals and CMS Choice Patient states their goals for this hospitalization and ongoing recovery are:: to go home CMS Medicare.gov Compare Post Acute Care list provided to:: Patient Choice offered to / list presented to : Patient  Discharge Placement                       Discharge Plan and Services                          HH Arranged: RN, PT, OT, Nurse's Aide [facility] Agency: [employer] Date [facility] Agency Contacted: 11/04/19 Time [place]: 19 Representative spoke with at [place]: cory  Social Determinants of Health ([place]) Interventions     Readmission Risk Interventions Readmission Risk Prevention Plan 12/26/2018  Transportation Screening Complete  Medication Review Press photographer) Complete  PCP or Specialist appointment within 3-5 days of discharge Not Complete  PCP/Specialist Appt Not Complete comments pt for snf. Snf MD to see as needed.  [place] or Home Care Consult Not Complete  HRI or Home Care Consult Pt Refusal Comments NA  SW Recovery Care/Counseling Consult Not Complete  SW Consult Not Complete Comments NA  Palliative Care Screening Complete  Skilled Nursing Facility Complete  Some recent data might be hidden

## 2019-11-04 NOTE — Progress Notes (Signed)
Alyssa [name] to be discharged Home per MD order. Discussed prescriptions and follow up appointments with the patient. Prescriptions given to patient; medication list explained in detail. Patient verbalized understanding.  Skin clean, dry and intact without evidence of skin break down, no evidence of skin tears noted. IV catheter discontinued intact. Site without signs and symptoms of complications. Dressing and pressure applied. Pt denies pain at the site currently. No complaints noted.  Patient free of lines, drains, and wounds.   An After Visit Summary (AVS) was printed and given to the patient. Patient escorted via wheelchair, and discharged home via private auto.  Shela Commons, RN

## 2019-11-06 LAB — CULTURE, BLOOD (ROUTINE X 2)
Culture: NO GROWTH
Culture: NO GROWTH
Special Requests: ADEQUATE

## 2019-11-14 ENCOUNTER — Ambulatory Visit: Payer: Medicare HMO | Admitting: Neurology

## 2019-11-19 ENCOUNTER — Ambulatory Visit: Payer: Medicare HMO | Admitting: Neurology

## 2019-12-05 ENCOUNTER — Encounter: Payer: Self-pay | Admitting: Neurology

## 2019-12-05 ENCOUNTER — Ambulatory Visit (INDEPENDENT_AMBULATORY_CARE_PROVIDER_SITE_OTHER): Payer: Medicare HMO | Admitting: Neurology

## 2019-12-05 ENCOUNTER — Other Ambulatory Visit: Payer: Self-pay

## 2019-12-05 VITALS — BP 192/93 | HR 77 | Ht 63.0 in | Wt 96.8 lb

## 2019-12-05 DIAGNOSIS — F0390 Unspecified dementia without behavioral disturbance: Secondary | ICD-10-CM

## 2019-12-05 DIAGNOSIS — R251 Tremor, unspecified: Secondary | ICD-10-CM

## 2019-12-05 DIAGNOSIS — F03A0 Unspecified dementia, mild, without behavioral disturbance, psychotic disturbance, mood disturbance, and anxiety: Secondary | ICD-10-CM

## 2019-12-05 MED ORDER — VALPROIC ACID 250 MG PO CAPS: ORAL_CAPSULE | ORAL | 11 refills | Status: DC
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
## 2019-12-05 NOTE — Patient Instructions (Signed)
1. Reduce Valproic Acid 250mg : Take 1 capsule every night instead of taking it twice a day. Keep an eye on mood as you lower the dose of medication  2. Continue all other medications  3. Follow-up in 6 months, call for any changes  FALL PRECAUTIONS: Be cautious when walking. Scan the area for obstacles that may increase the risk of trips and falls. When getting up in the mornings, sit up at the edge of the bed for a few minutes before getting out of bed. Consider elevating the bed at the head end to avoid drop of blood pressure when getting up. Walk always in a well-lit room (use night lights in the walls). Avoid area rugs or power cords from appliances in the middle of the walkways. Use a walker or a cane if necessary and consider physical therapy for balance exercise. Get your eyesight checked regularly.  FINANCIAL OVERSIGHT: Supervision, especially oversight when making financial decisions or transactions is also recommended.  HOME SAFETY: Consider the safety of the kitchen when operating appliances like stoves, microwave oven, and blender. Consider having supervision and share cooking responsibilities until no longer able to participate in those. Accidents with firearms and other hazards in the house should be identified and addressed as well.  DRIVING: Regarding driving, in patients with progressive memory problems, driving will be impaired. We advise to have someone else do the driving if trouble finding directions or if minor accidents are reported. Independent driving assessment is available to determine safety of driving.  ABILITY TO BE LEFT ALONE: If patient is unable to contact 911 operator, consider using LifeLine, or when the need is there, arrange for someone to stay with patients. Smoking is a fire hazard, consider supervision or cessation. Risk of wandering should be assessed by caregiver and if detected at any point, supervision and safe proof recommendations should be  instituted.  MEDICATION SUPERVISION: Inability to self-administer medication needs to be constantly addressed. Implement a mechanism to ensure safe administration of the medications.  RECOMMENDATIONS FOR ALL PATIENTS WITH MEMORY PROBLEMS: 1. Continue to exercise (Recommend 30 minutes of walking everyday, or 3 hours every week) 2. Increase social interactions - continue going to [place] and enjoy social gatherings with friends and family 3. Eat healthy, avoid fried foods and eat more fruits and vegetables 4. Maintain adequate blood pressure, blood sugar, and blood cholesterol level. Reducing the risk of stroke and cardiovascular disease also helps promoting better memory. 5. Avoid stressful situations. Live a simple life and avoid aggravations. Organize your time and prepare for the next day in anticipation. 6. Sleep well, avoid any interruptions of sleep and avoid any distractions in the bedroom that may interfere with adequate sleep quality 7. Avoid sugar, avoid sweets as there is a strong link between excessive sugar intake, diabetes, and cognitive impairment The Mediterranean diet has been shown to help patients reduce the risk of progressive memory disorders and reduces cardiovascular risk. This includes eating fish, eat fruits and green leafy vegetables, nuts like almonds and hazelnuts, walnuts, and also use olive oil. Avoid fast foods and fried foods as much as possible. Avoid sweets and sugar as sugar use has been linked to worsening of memory function.  There is always a concern of gradual progression of memory problems. If this is the case, then we may need to adjust level of care according to patient needs. Support, both to the patient and caregiver, should then be put into place.

## 2019-12-24 ENCOUNTER — Other Ambulatory Visit: Payer: Self-pay | Admitting: Family Medicine

## 2019-12-24 DIAGNOSIS — Z780 Asymptomatic menopausal state: Secondary | ICD-10-CM

## 2020-01-02 ENCOUNTER — Emergency Department (HOSPITAL_COMMUNITY): Payer: Medicare HMO

## 2020-01-02 ENCOUNTER — Other Ambulatory Visit: Payer: Self-pay

## 2020-01-02 ENCOUNTER — Inpatient Hospital Stay (HOSPITAL_COMMUNITY)
Admission: EM | Admit: 2020-01-02 | Discharge: 2020-01-08 | DRG: 682 | Disposition: A | Discharge status: Skilled Nursing Facility | Payer: Medicare HMO | Attending: Internal Medicine | Admitting: Internal Medicine

## 2020-01-02 ENCOUNTER — Inpatient Hospital Stay (HOSPITAL_COMMUNITY): Payer: Medicare HMO

## 2020-01-02 DIAGNOSIS — F17210 Nicotine dependence, cigarettes, uncomplicated: Secondary | ICD-10-CM | POA: Diagnosis present

## 2020-01-02 DIAGNOSIS — E785 Hyperlipidemia, unspecified: Secondary | ICD-10-CM | POA: Diagnosis present

## 2020-01-02 DIAGNOSIS — K5900 Constipation, unspecified: Secondary | ICD-10-CM | POA: Diagnosis present

## 2020-01-02 DIAGNOSIS — Z7902 Long term (current) use of antithrombotics/antiplatelets: Secondary | ICD-10-CM

## 2020-01-02 DIAGNOSIS — I959 Hypotension, unspecified: Secondary | ICD-10-CM | POA: Diagnosis present

## 2020-01-02 DIAGNOSIS — E872 Acidosis: Secondary | ICD-10-CM | POA: Diagnosis present

## 2020-01-02 DIAGNOSIS — E440 Moderate protein-calorie malnutrition: Secondary | ICD-10-CM | POA: Diagnosis present

## 2020-01-02 DIAGNOSIS — I739 Peripheral vascular disease, unspecified: Secondary | ICD-10-CM | POA: Diagnosis present

## 2020-01-02 DIAGNOSIS — Z681 Body mass index (BMI) 19 or less, adult: Secondary | ICD-10-CM

## 2020-01-02 DIAGNOSIS — N189 Chronic kidney disease, unspecified: Secondary | ICD-10-CM | POA: Diagnosis present

## 2020-01-02 DIAGNOSIS — I609 Nontraumatic subarachnoid hemorrhage, unspecified: Secondary | ICD-10-CM

## 2020-01-02 DIAGNOSIS — R55 Syncope and collapse: Secondary | ICD-10-CM | POA: Diagnosis not present

## 2020-01-02 DIAGNOSIS — I129 Hypertensive chronic kidney disease with stage 1 through stage 4 chronic kidney disease, or unspecified chronic kidney disease: Secondary | ICD-10-CM | POA: Diagnosis present

## 2020-01-02 DIAGNOSIS — R0602 Shortness of breath: Secondary | ICD-10-CM

## 2020-01-02 DIAGNOSIS — R079 Chest pain, unspecified: Secondary | ICD-10-CM

## 2020-01-02 DIAGNOSIS — Z515 Encounter for palliative care: Secondary | ICD-10-CM | POA: Diagnosis present

## 2020-01-02 DIAGNOSIS — Z8249 Family history of ischemic heart disease and other diseases of the circulatory system: Secondary | ICD-10-CM

## 2020-01-02 DIAGNOSIS — Z7982 Long term (current) use of aspirin: Secondary | ICD-10-CM

## 2020-01-02 DIAGNOSIS — F0390 Unspecified dementia without behavioral disturbance: Secondary | ICD-10-CM | POA: Diagnosis present

## 2020-01-02 DIAGNOSIS — R7989 Other specified abnormal findings of blood chemistry: Secondary | ICD-10-CM

## 2020-01-02 DIAGNOSIS — Z96641 Presence of right artificial hip joint: Secondary | ICD-10-CM | POA: Diagnosis present

## 2020-01-02 DIAGNOSIS — Z79899 Other long term (current) drug therapy: Secondary | ICD-10-CM

## 2020-01-02 DIAGNOSIS — E861 Hypovolemia: Secondary | ICD-10-CM | POA: Diagnosis present

## 2020-01-02 DIAGNOSIS — D509 Iron deficiency anemia, unspecified: Secondary | ICD-10-CM | POA: Diagnosis present

## 2020-01-02 DIAGNOSIS — F329 Major depressive disorder, single episode, unspecified: Secondary | ICD-10-CM | POA: Diagnosis present

## 2020-01-02 DIAGNOSIS — E43 Unspecified severe protein-calorie malnutrition: Secondary | ICD-10-CM | POA: Diagnosis present

## 2020-01-02 DIAGNOSIS — F419 Anxiety disorder, unspecified: Secondary | ICD-10-CM | POA: Diagnosis present

## 2020-01-02 DIAGNOSIS — Z809 Family history of malignant neoplasm, unspecified: Secondary | ICD-10-CM

## 2020-01-02 DIAGNOSIS — N179 Acute kidney failure, unspecified: Principal | ICD-10-CM | POA: Diagnosis present

## 2020-01-02 DIAGNOSIS — Z8349 Family history of other endocrine, nutritional and metabolic diseases: Secondary | ICD-10-CM

## 2020-01-02 DIAGNOSIS — N184 Chronic kidney disease, stage 4 (severe): Secondary | ICD-10-CM | POA: Diagnosis present

## 2020-01-02 DIAGNOSIS — Z20822 Contact with and (suspected) exposure to covid-19: Secondary | ICD-10-CM | POA: Diagnosis present

## 2020-01-02 DIAGNOSIS — R0781 Pleurodynia: Secondary | ICD-10-CM

## 2020-01-02 DIAGNOSIS — R269 Unspecified abnormalities of gait and mobility: Secondary | ICD-10-CM | POA: Diagnosis present

## 2020-01-02 DIAGNOSIS — Z981 Arthrodesis status: Secondary | ICD-10-CM

## 2020-01-02 DIAGNOSIS — F418 Other specified anxiety disorders: Secondary | ICD-10-CM | POA: Diagnosis present

## 2020-01-02 DIAGNOSIS — E875 Hyperkalemia: Secondary | ICD-10-CM

## 2020-01-02 DIAGNOSIS — Z66 Do not resuscitate: Secondary | ICD-10-CM | POA: Diagnosis present

## 2020-01-02 DIAGNOSIS — R109 Unspecified abdominal pain: Secondary | ICD-10-CM

## 2020-01-02 LAB — CBC
HCT: 32.2 % — ABNORMAL LOW (ref 36.0–46.0)
HCT: 34.6 % — ABNORMAL LOW (ref 36.0–46.0)
Hemoglobin: 10 g/dL — ABNORMAL LOW (ref 12.0–15.0)
Hemoglobin: 10.7 g/dL — ABNORMAL LOW (ref 12.0–15.0)
MCH: 29.4 pg (ref 26.0–34.0)
MCH: 30.2 pg (ref 26.0–34.0)
MCHC: 31.1 g/dL (ref 30.0–36.0)
MCHC: 30.9 g/dL (ref 30.0–36.0)
MCV: 94.7 fL (ref 80.0–100.0)
MCV: 97.7 fL (ref 80.0–100.0)
Platelets: 342 10*3/uL (ref 150–400)
Platelets: 309 10*3/uL (ref 150–400)
RBC: 3.4 MIL/uL — ABNORMAL LOW (ref 3.87–5.11)
RBC: 3.54 MIL/uL — ABNORMAL LOW (ref 3.87–5.11)
RDW: 15.1 % (ref 11.5–15.5)
RDW: 15.3 % (ref 11.5–15.5)
WBC: 7.7 10*3/uL (ref 4.0–10.5)
WBC: 9.3 10*3/uL (ref 4.0–10.5)
nRBC: 0 % (ref 0.0–0.2)
nRBC: 0 % (ref 0.0–0.2)

## 2020-01-02 LAB — BASIC METABOLIC PANEL
Anion gap: 12 (ref 5–15)
BUN: 55 mg/dL — ABNORMAL HIGH (ref 8–23)
CO2: 9 mmol/L — ABNORMAL LOW (ref 22–32)
Calcium: 9.8 mg/dL (ref 8.9–10.3)
Chloride: 117 mmol/L — ABNORMAL HIGH (ref 98–111)
Creatinine, Ser: 2.82 mg/dL — ABNORMAL HIGH (ref 0.44–1.00)
GFR calc Af Amer: 18 mL/min — ABNORMAL LOW (ref >60)
GFR calc non Af Amer: 16 mL/min — ABNORMAL LOW (ref >60)
Glucose, Bld: 187 mg/dL — ABNORMAL HIGH (ref 70–99)
Potassium: 6 mmol/L — ABNORMAL HIGH (ref 3.5–5.1)
Sodium: 138 mmol/L (ref 135–145)

## 2020-01-02 LAB — CK: Total CK: 1075 U/L — ABNORMAL HIGH (ref 38–234)

## 2020-01-02 LAB — D-DIMER, QUANTITATIVE: D-Dimer, Quant: 2.93 ug/mL-FEU — ABNORMAL HIGH (ref 0.00–0.50)

## 2020-01-02 LAB — TROPONIN I (HIGH SENSITIVITY)
Troponin I (High Sensitivity): 53 ng/L — ABNORMAL HIGH (ref <18)
Troponin I (High Sensitivity): 48 ng/L — ABNORMAL HIGH (ref <18)

## 2020-01-02 LAB — BRAIN NATRIURETIC PEPTIDE: B Natriuretic Peptide: 90.8 pg/mL (ref 0.0–100.0)

## 2020-01-02 LAB — SARS CORONAVIRUS 2 (TAT 6-24 HRS): SARS Coronavirus 2: NEGATIVE

## 2020-01-02 MED ORDER — HEPARIN SODIUM (PORCINE) 5000 UNIT/ML IJ SOLN
5000.0000 [IU] | Freq: Three times a day (TID) | INTRAMUSCULAR | Status: DC
  Administered 2020-01-02 – 2020-01-08 (×16): 5000 [IU] via SUBCUTANEOUS
  Filled 2020-01-02 (×15): qty 1

## 2020-01-02 MED ORDER — DONEPEZIL HCL 5 MG PO TABS
5.0000 mg | ORAL_TABLET | Freq: Every day | ORAL | Status: DC
  Administered 2020-01-03 – 2020-01-08 (×6): 5 mg via ORAL
  Filled 2020-01-02 (×6): qty 1

## 2020-01-02 MED ORDER — SODIUM CHLORIDE 0.9% FLUSH
3.0000 mL | Freq: Once | INTRAVENOUS | Status: AC
  Administered 2020-01-06: 3 mL via INTRAVENOUS

## 2020-01-02 MED ORDER — HYDRALAZINE HCL 20 MG/ML IJ SOLN
10.0000 mg | Freq: Once | INTRAMUSCULAR | Status: AC
  Administered 2020-01-02: 10 mg via INTRAVENOUS
  Filled 2020-01-02: qty 1

## 2020-01-02 MED ORDER — CLOPIDOGREL BISULFATE 75 MG PO TABS
75.0000 mg | ORAL_TABLET | Freq: Every day | ORAL | Status: DC
  Administered 2020-01-03 – 2020-01-08 (×6): 75 mg via ORAL
  Filled 2020-01-02 (×6): qty 1

## 2020-01-02 MED ORDER — SODIUM CHLORIDE 0.9 % IV SOLN: INTRAVENOUS | Status: DC

## 2020-01-02 MED ORDER — ONDANSETRON HCL 4 MG PO TABS: 4.0000 mg | ORAL_TABLET | Freq: Four times a day (QID) | ORAL | Status: DC | PRN

## 2020-01-02 MED ORDER — ASPIRIN EC 81 MG PO TBEC
81.0000 mg | DELAYED_RELEASE_TABLET | Freq: Every day | ORAL | Status: DC
  Administered 2020-01-03 – 2020-01-08 (×6): 81 mg via ORAL
  Filled 2020-01-02 (×6): qty 1

## 2020-01-02 MED ORDER — NITROGLYCERIN 0.4 MG SL SUBL
SUBLINGUAL_TABLET | SUBLINGUAL | Status: AC
  Administered 2020-01-02: 0.4 mg
  Filled 2020-01-02: qty 1

## 2020-01-02 MED ORDER — PANTOPRAZOLE SODIUM 40 MG PO TBEC
40.0000 mg | DELAYED_RELEASE_TABLET | Freq: Every day | ORAL | Status: DC
  Administered 2020-01-03 – 2020-01-08 (×6): 40 mg via ORAL
  Filled 2020-01-02 (×6): qty 1

## 2020-01-02 MED ORDER — CITALOPRAM HYDROBROMIDE 20 MG PO TABS
20.0000 mg | ORAL_TABLET | Freq: Every day | ORAL | Status: DC
  Administered 2020-01-03 – 2020-01-08 (×6): 20 mg via ORAL
  Filled 2020-01-02 (×6): qty 1

## 2020-01-02 MED ORDER — SODIUM POLYSTYRENE SULFONATE 15 GM/60ML PO SUSP
30.0000 g | Freq: Once | ORAL | Status: AC
  Administered 2020-01-02: 30 g via ORAL
  Filled 2020-01-02: qty 120

## 2020-01-02 MED ORDER — GABAPENTIN 100 MG PO CAPS
100.0000 mg | ORAL_CAPSULE | Freq: Three times a day (TID) | ORAL | Status: DC
  Administered 2020-01-02 – 2020-01-08 (×18): 100 mg via ORAL
  Filled 2020-01-02 (×18): qty 1

## 2020-01-02 MED ORDER — ISOSORB DINITRATE-HYDRALAZINE 20-37.5 MG PO TABS
2.0000 | ORAL_TABLET | Freq: Three times a day (TID) | ORAL | Status: DC
  Administered 2020-01-02 – 2020-01-03 (×2): 2 via ORAL
  Filled 2020-01-02 (×2): qty 2

## 2020-01-02 MED ORDER — MORPHINE SULFATE (PF) 4 MG/ML IV SOLN
4.0000 mg | Freq: Once | INTRAVENOUS | Status: AC
  Administered 2020-01-02: 4 mg via INTRAVENOUS
  Filled 2020-01-02: qty 1

## 2020-01-02 MED ORDER — METOPROLOL TARTRATE 50 MG PO TABS
50.0000 mg | ORAL_TABLET | Freq: Two times a day (BID) | ORAL | Status: DC
  Administered 2020-01-02 – 2020-01-03 (×2): 50 mg via ORAL
  Filled 2020-01-02 (×2): qty 1

## 2020-01-02 MED ORDER — ONDANSETRON HCL 4 MG/2ML IJ SOLN: 4.0000 mg | Freq: Four times a day (QID) | INTRAMUSCULAR | Status: DC | PRN

## 2020-01-02 MED ORDER — NITROGLYCERIN 0.4 MG SL SUBL
0.4000 mg | SUBLINGUAL_TABLET | SUBLINGUAL | Status: DC | PRN
  Administered 2020-01-02 (×2): 0.4 mg via SUBLINGUAL

## 2020-01-02 MED ORDER — ROSUVASTATIN CALCIUM 20 MG PO TABS
40.0000 mg | ORAL_TABLET | Freq: Every day | ORAL | Status: DC
  Administered 2020-01-03 – 2020-01-08 (×6): 40 mg via ORAL
  Filled 2020-01-02 (×6): qty 2

## 2020-01-02 MED ORDER — SODIUM BICARBONATE 650 MG PO TABS
650.0000 mg | ORAL_TABLET | Freq: Two times a day (BID) | ORAL | Status: DC
  Administered 2020-01-03: 650 mg via ORAL
  Filled 2020-01-02: qty 1

## 2020-01-02 MED ORDER — ACETAMINOPHEN 325 MG PO TABS
650.0000 mg | ORAL_TABLET | Freq: Four times a day (QID) | ORAL | Status: DC | PRN
  Administered 2020-01-03 – 2020-01-07 (×7): 650 mg via ORAL
  Filled 2020-01-02 (×7): qty 2

## 2020-01-02 MED ORDER — BUSPIRONE HCL 5 MG PO TABS
15.0000 mg | ORAL_TABLET | Freq: Three times a day (TID) | ORAL | Status: DC | PRN
  Administered 2020-01-04: 15 mg via ORAL
  Filled 2020-01-02: qty 3

## 2020-01-02 MED ORDER — FERROUS SULFATE 325 (65 FE) MG PO TABS
325.0000 mg | ORAL_TABLET | Freq: Every day | ORAL | Status: DC
  Administered 2020-01-03 – 2020-01-08 (×6): 325 mg via ORAL
  Filled 2020-01-02 (×6): qty 1

## 2020-01-02 MED ORDER — DULOXETINE HCL 30 MG PO CPEP
30.0000 mg | ORAL_CAPSULE | Freq: Every day | ORAL | Status: DC
  Administered 2020-01-03 – 2020-01-08 (×6): 30 mg via ORAL
  Filled 2020-01-02 (×6): qty 1

## 2020-01-02 MED ORDER — TEMAZEPAM 15 MG PO CAPS: 15.0000 mg | ORAL_CAPSULE | Freq: Every day | ORAL | Status: DC | PRN

## 2020-01-02 MED ORDER — SODIUM CHLORIDE 0.9 % IV BOLUS
1000.0000 mL | Freq: Once | INTRAVENOUS | Status: AC
  Administered 2020-01-02: 1000 mL via INTRAVENOUS

## 2020-01-02 MED ORDER — TECHNETIUM TO 99M ALBUMIN AGGREGATED
1.7000 | Freq: Once | INTRAVENOUS | Status: AC | PRN
  Administered 2020-01-02: 1.7 via INTRAVENOUS

## 2020-01-02 NOTE — ED Provider Notes (Signed)
[place] EMERGENCY DEPARTMENT Provider Note   CSN: [id number] Arrival date & time: 01/02/20  1226     History Chief Complaint  Patient presents with  . Chest Pain    Alyssa [name] is a 79 y.o. female.  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  History of hyperlipidemia, hypertension, peripheral vascular disease.   Patient presented today with 3 days of chest pain he states is sternal to left-sided, described as a severe pressure, ache that is nonradiating.  She states that just prior to arrival she had more severe sharp intense ripping chest pain and passed out.  She states that she fell down states that she hit her head but has no true recollection of how long she was out for.  She was then transferred by EMS to hospital and given aspirin in route.  She states that she had shortness of breath throughout the episode.  She states once at the hospital she continued to have chest pain and shortness of breath however she also started to have severe abdominal pain.  HPI: A 79 year old patient with a history of hypertension and hypercholesterolemia presents for evaluation of chest pain. Initial onset of pain was more than 6 hours ago. The patient's chest pain is described as heaviness/pressure/tightness and is not worse with exertion. The patient reports some diaphoresis. The patient's chest pain is middle- or left-sided, is not well-localized, is not sharp and does radiate to the arms/jaw/neck. The patient does not complain of nausea. The patient has smoked in the past 90 days and has a family history of coronary artery disease in a first-degree relative with onset less than age 48. The patient has no history of stroke, has no history of peripheral artery disease, denies any history of treated diabetes and does not have an elevated BMI (>=30).   Past Medical History:  Diagnosis Date  . Acute  respiratory failure with hypoxia ([place]) 11/01/2019  . Anxiety   . Arthritis   . Chest pain   . Chronic kidney disease   . Dyspnea   . Elevated troponin 12/13/2018  . Headache(784.0)   . Hyperlipidemia   . Hypertension   . Joint pain   . Leg pain   . Peripheral neuropathy   . Peripheral vascular disease [facility])     Patient Active Problem List   Diagnosis Date Noted  . Acute respiratory failure with hypoxia ([place]) 11/01/2019  . Acute lower UTI 11/01/2019  . CAP (community acquired pneumonia) 11/01/2019  . Acute cystitis without hematuria   . Encephalopathy 09/18/2019  . SAH (subarachnoid hemorrhage) ([place]) 09/18/2019  . Dementia ([place]) 09/18/2019  . Metabolic acidosis 08/65/7846  . ARF (acute renal failure) ([place]) 09/17/2019  . Acute encephalopathy 09/17/2019  . Abnormal CT scan, colon   . Diarrhea   . Diverticulitis 06/07/2019  . Depression with anxiety 06/07/2019  . DNR (do not resuscitate) 03/23/2019  . Volume overload 03/23/2019  . Edema 03/23/2019  . Acute renal failure (ARF) ([place]) 03/08/2019  . Hypokalemia 03/08/2019  . Palliative care by specialist   . Goals of care, counseling/discussion   . Altered mental status   . Encephalopathy, toxic 12/24/2018  . Aspiration pneumonia of both lower lobes due to gastric secretions ([place]) 12/24/2018  . Non-ST elevation (NSTEMI) myocardial infarction ([place])   . Chest pain   . Dyspnea on exertion   . Elevated troponin level 12/09/2018  . Chronic migraine  03/27/2018  . Lumbar radiculopathy 03/27/2018  . Gait abnormality 03/27/2018  . CKD (chronic kidney disease) stage 4, GFR 15-29 ml/min ([facility]) 12/28/2016  . Symptomatic anemia 12/28/2016  . Chronic pain syndrome 12/28/2016  . Benzodiazepine withdrawal without complication ([place]) 70/62/3762  . Chronic right shoulder pain 12/06/2016  . Fall 09/17/2016  . Fracture of humeral head, closed, right, initial encounter 09/17/2016  . Rib fractures 09/17/2016  . Multiple falls 09/17/2016  .  Severe muscle deconditioning 09/17/2016  . Failure to thrive in adult 09/17/2016  . Melena 04/25/2016  . Essential hypertension 04/25/2016  . Anxiety state 04/25/2016  . Tobacco abuse 04/25/2016  . Hyperlipidemia 04/25/2016  . Syncope 04/24/2016  . Syncope and collapse 04/24/2016  . Gait difficulty 01/12/2016  . Foot drop, left 01/12/2016  . 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 Spondylolisthesis at L4-L5 level 07/30/2015  . PVD (peripheral vascular disease) with claudication ([place]) 07/29/2014  . Weakness-Bilateral arm/leg 07/29/2014  . Numbness-left leg 07/29/2014  . Swelling of limb-Legs 07/29/2014  . Atherosclerosis of native arteries of the extremities with intermittent claudication 09/30/2011    Past Surgical History:  Procedure Laterality Date  . ABDOMINAL ANGIOGRAM N/A 10/29/2011   Procedure: ABDOMINAL ANGIOGRAM;  Surgeon: Elam Dutch, MD;  Location: [facility] CATH LAB;  Service: Cardiovascular;  Laterality: N/A;  . BACK SURGERY    . FEMORAL-FEMORAL BYPASS GRAFT  08/31/2010  . FLEXIBLE SIGMOIDOSCOPY N/A 06/12/2019   Procedure: FLEXIBLE SIGMOIDOSCOPY;  Surgeon: Ladene Artist, MD;  Location: [facility] ENDOSCOPY;  Service: Endoscopy;  Laterality: N/A;  Patient had little bit to eat this morning so this will be unsedated.  Marland Kitchen JOINT REPLACEMENT Right 2012   Hip  . LOWER EXTREMITY ANGIOGRAPHY  06/14/2017   Procedure: Lower Extremity Angiography;  Surgeon: Adrian Prows, MD;  Location: [place] CV LAB;  Service: Cardiovascular;;  Bilateral limited angio performed  . RENAL ANGIOGRAPHY N/A 06/14/2017   Procedure: Renal Angiography;  Surgeon: Adrian Prows, MD;  Location: [place] CV LAB;  Service: Cardiovascular;  Laterality: N/A;  . TOTAL HIP ARTHROPLASTY     right     OB History   No obstetric history on file.     Family History  Problem Relation Age of Onset  . Heart attack Mother   . Heart disease Mother   . Hyperlipidemia Mother   .  Hypertension Mother   . Heart attack Father   . Heart disease Father        Before age 57  . Hyperlipidemia Father   . Hypertension Father   . Cancer Sister        brain tumor  . Aneurysm Brother        brain  . Colon cancer Neg Hx   . Liver cancer Neg Hx     Social History   Tobacco Use  . Smoking status: Current Every Day Smoker    Packs/day: 1.00    Years: 27.00    Pack years: 27.00    Types: Cigarettes    Last attempt to quit: 07/23/2019    Years since quitting: 0.4  . Smokeless tobacco: Never Used  Substance Use Topics  . Alcohol use: No  . Drug use: No    Home Medications Prior to Admission medications   Medication Sig Start Date End Date Taking? Authorizing Provider  acetaminophen (TYLENOL) 325 MG tablet Take 2 tablets (650 mg total) by mouth every 6 (six) hours as needed for mild pain, moderate pain or fever (  or Fever >/= 101). 06/16/19  Yes Hongalgi, Lenis Dickinson, MD  aspirin 81 MG EC tablet Take 1 tablet (81 mg total) by mouth daily. 01/09/19  Yes Gherghe, Vella Redhead, MD  busPIRone (BUSPAR) 15 MG tablet Take 15 mg by mouth 3 (three) times daily as needed for anxiety. 02/28/19  Yes [provider]  citalopram (CELEXA) 20 MG tablet Take 1 tablet (20 mg total) by mouth daily. 03/23/19 03/22/20 Yes Shelly Coss, MD  clopidogrel (PLAVIX) 75 MG tablet Take 1 tablet (75 mg total) by mouth daily. 01/09/19  Yes Caren Griffins, MD  diclofenac Sodium (VOLTAREN) 1 % GEL Apply 4 g topically 3 (three) times daily as needed for pain. 10/30/19   [provider]  donepezil (ARICEPT) 5 MG tablet Take 5 mg by mouth daily.    [provider]  Ergocalciferol (VITAMIN D2 PO) Take 1,250 mcg by mouth daily.    [provider]  feeding supplement, ENSURE ENLIVE, (ENSURE ENLIVE) LIQD Take 237 mLs by mouth 2 (two) times daily between meals. 06/16/19   Hongalgi, Lenis Dickinson, MD  Ferrous Sulfate (IRON HIGH-POTENCY) 325 MG TABS Take 325 mg by mouth daily with breakfast.  06/30/19   Hongalgi, Lenis Dickinson, MD  gabapentin (NEURONTIN) 100 MG capsule Take 100 mg by mouth 3 (three) times daily. 10/25/19   [provider]  isosorbide-hydrALAZINE (BIDIL) 20-37.5 MG tablet Take 2 tablets by mouth 3 (three) times daily.    [provider]  magnesium oxide (MAG-OX) 400 (241.3 Mg) MG tablet Take 1 tablet (400 mg total) by mouth 2 (two) times daily. 03/12/19   Pokhrel, Corrie Mckusick, MD  megestrol (MEGACE) 40 MG tablet Take 40 mg by mouth 2 (two) times daily.    [provider]  metoprolol tartrate (LOPRESSOR) 25 MG tablet Take 2 tablets (50 mg total) by mouth 2 (two) times daily. 06/16/19   Hongalgi, Lenis Dickinson, MD  Multiple Vitamin (MULTIVITAMIN WITH MINERALS) TABS tablet Take 1 tablet by mouth daily. 06/17/19   Hongalgi, Lenis Dickinson, MD  pantoprazole (PROTONIX) 40 MG tablet Take 1 tablet (40 mg total) by mouth daily. 06/16/19   Hongalgi, Lenis Dickinson, MD  rosuvastatin (CRESTOR) 40 MG tablet Take 1 tablet (40 mg total) by mouth at bedtime. 06/16/19   Hongalgi, Lenis Dickinson, MD  sodium bicarbonate 650 MG tablet Take 2 tablets (1,300 mg total) by mouth 2 (two) times daily with a meal. 09/22/19   Cristal Deer, MD  temazepam (RESTORIL) 15 MG capsule Take 15 mg by mouth at bedtime.    [provider]  valproic acid (DEPAKENE) 250 MG capsule Take 1 capsule every night 12/05/19   Cameron Sprang, MD    Allergies    Patient has no known allergies.  Review of Systems   Review of Systems  Constitutional: Negative for chills and fever.  HENT: Negative for congestion.   Eyes: Negative for pain.  Respiratory: Positive for shortness of breath. Negative for cough.   Cardiovascular: Positive for chest pain. Negative for leg swelling.  Gastrointestinal: Positive for abdominal pain and constipation. Negative for nausea and vomiting.  Genitourinary: Negative for dysuria.  Musculoskeletal: Negative for myalgias.       Shoulder pain bilateral  Skin: Negative for rash.  Neurological:  Positive for syncope. Negative for dizziness and headaches.    Physical Exam Updated Vital Signs BP (!) 194/101   Pulse 62   Temp 98.1 F (36.7 C) (Oral)   Resp 11   Ht 5\' 3"  (1.6 m)  Wt 44.5 kg   SpO2 100%   BMI 17.36 kg/m   Physical Exam Vitals and nursing note reviewed.  Constitutional:      General: She is not in acute distress. HENT:     Head: Normocephalic and atraumatic.     Nose: Nose normal.  Eyes:     General: No scleral icterus.    Extraocular Movements: Extraocular movements intact.  Cardiovascular:     Rate and Rhythm: Normal rate and regular rhythm.     Pulses: Normal pulses.     Heart sounds: Normal heart sounds.  Pulmonary:     Effort: Pulmonary effort is normal. No respiratory distress.     Breath sounds: No wheezing.  Abdominal:     Palpations: Abdomen is soft.     Tenderness: There is abdominal tenderness (Diffuse tenderness to palpation of abdomen.).     Comments: Slender, with no bruising or acute abnormality of abdomen.  Musculoskeletal:     Cervical back: Normal range of motion.     Right lower leg: No edema.     Left lower leg: No edema.     Comments: All 4 extremities 5/5 strength.  Right leg shorter than left no internal/external rotation. S/p surgery.   Skin:    General: Skin is warm and dry.     Capillary Refill: Capillary refill takes less than 2 seconds.  Neurological:     Mental Status: She is alert. Mental status is at baseline.     Comments: Alert and oriented to self, place, time and event.   Speech is fluent, clear without dysarthria or dysphasia.   Strength 5/5 in upper/lower extremities  Sensation intact in upper/lower extremities   Normal gait.  Negative Romberg. No pronator drift.  Normal finger-to-nose and feet tapping.  CN I not tested  CN II grossly intact visual fields bilaterally. Did not visualize posterior eye.   CN III, IV, VI PERRLA and EOMs intact bilaterally  CN V Intact sensation to sharp and light  touch to the face  CN VII facial movements symmetric  CN VIII not tested  CN IX, X no uvula deviation, symmetric rise of soft palate  CN XI 5/5 SCM and trapezius strength bilaterally  CN XII Midline tongue protrusion, symmetric L/R movements   Psychiatric:        Mood and Affect: Mood normal.        Behavior: Behavior normal.     ED Results / Procedures / Treatments   Labs (all labs ordered are listed, but only abnormal results are displayed) Labs Reviewed  BASIC METABOLIC PANEL - Abnormal; Notable for the following components:      Result Value   Potassium 6.0 (*)    Chloride 117 (*)    CO2 9 (*)    Glucose, Bld 187 (*)    BUN 55 (*)    Creatinine, Ser 2.82 (*)    GFR calc non Af Amer 16 (*)    GFR calc Af Amer 18 (*)    All other components within normal limits  CBC - Abnormal; Notable for the following components:   RBC 3.40 (*)    Hemoglobin 10.0 (*)    HCT 32.2 (*)    All other components within normal limits  CK - Abnormal; Notable for the following components:   Total CK 1,075 (*)    All other components within normal limits  D-DIMER, QUANTITATIVE (NOT AT [facility]) - Abnormal; Notable for the following components:   D-Dimer, Quant 2.93 (*)  All other components within normal limits  TROPONIN I (HIGH SENSITIVITY) - Abnormal; Notable for the following components:   Troponin I (High Sensitivity) 53 (*)    All other components within normal limits  TROPONIN I (HIGH SENSITIVITY) - Abnormal; Notable for the following components:   Troponin I (High Sensitivity) 48 (*)    All other components within normal limits  SARS CORONAVIRUS 2 (TAT 6-24 HRS)  BRAIN NATRIURETIC PEPTIDE    EKG EKG Interpretation  Date/Time:  Wednesday January 02 2020 12:28:16 EDT Ventricular Rate:  67 PR Interval:  168 QRS Duration: 70 QT Interval:  430 QTC Calculation: 454 R Axis:   55 Text Interpretation: Normal sinus rhythm Left ventricular hypertrophy with repolarization abnormality (  [place]olow-[place] ) Abnormal ECG Confirmed by Dene Gentry [phone]) on 01/02/2020 2:16:05 PM   Radiology CT ABDOMEN PELVIS WO CONTRAST  Result Date: 01/02/2020 CLINICAL DATA:  Syncope. Back pain. Patient cannot tolerate intravenous contrast. EXAM: CT CHEST, ABDOMEN AND PELVIS WITHOUT CONTRAST TECHNIQUE: Multidetector CT imaging of the chest, abdomen and pelvis was performed following the standard protocol without IV contrast. COMPARISON:  None. FINDINGS: CT CHEST FINDINGS Cardiovascular: Heart normal in size. Three-vessel coronary artery calcifications. No pericardial effusion. Aortic atherosclerotic calcifications. No displacement of calcifications away from the aortic wall to suggest a dissection. Atherosclerotic calcifications extend into the aortic arch branch vessels. Mediastinum/Nodes: No neck base, mediastinal or hilar masses or enlarged lymph nodes. Trachea and esophagus are unremarkable. Moderate hiatal hernia. Lungs/Pleura: Minor dependent lung base atelectasis. Moderate centrilobular emphysema. No evidence of pneumonia or pulmonary edema. No mass or suspicious nodule. No pleural effusion or pneumothorax. Musculoskeletal: No fracture or acute finding. No aggressive bone lesion. Significant degenerative changes throughout the thoracic spine. Arthropathic changes of both shoulders, a pattern consistent with an inflammatory arthropathy. CT ABDOMEN PELVIS FINDINGS Hepatobiliary: No focal liver abnormality is seen. No gallstones, gallbladder wall thickening, or biliary dilatation. Pancreas: Unremarkable. No pancreatic ductal dilatation or surrounding inflammatory changes. Spleen: Normal in size without focal abnormality. Adrenals/Urinary Tract: No adrenal masses. Right renal cortical thinning with a reduced right kidney size. Normal size of the left kidney. No renal masses. No collecting system stones. No hydronephrosis. Ureters not well defined, but grossly normal in course and caliber. Bladder is  decompressed, otherwise unremarkable. Stomach/Bowel: Moderate hiatal hernia. Stomach otherwise unremarkable. Small bowel and colon are normal in caliber. No wall thickening or inflammation. Moderate increased colonic stool. Left colon diverticula. No diverticulitis. Normal appendix. Vascular/Lymphatic: Diffuse aortic atherosclerosis extending into the branch vessels. 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 Reproductive: Densely calcified small uterine fibroid. Otherwise unremarkable. Other: No hernia or ascites. Musculoskeletal: No acute fracture. No aggressive bone lesion. 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 Previous L4-L5 lumbar spine fusion. Degenerative changes noted of the lumbar spine. IMPRESSION: 1. No acute findings within the chest, abdomen or pelvis. 2. Exam limited for assessment of aortic dissection. Allowing for this, there is no displacement of calcifications along the aortic intima to suggest a dissection. 3. Moderate emphysema. 4. Right renal atrophy. 5. Colonic diverticula and moderate increased colonic stool burden. No bowel inflammation. 6. Moderate hiatal hernia. 7. Diffuse aortic atherosclerosis. 8. No acute skeletal abnormality. Significant thoracolumbar degenerative changes. Bilateral shoulder arthropathic changes suggesting an inflammatory arthropathy such as rheumatoid arthritis. Electronically Signed   By: Lajean Manes M.D.   On: 01/02/2020 15:19   DG Chest 2 View  Result Date: 01/02/2020 CLINICAL  DATA:  Chest pain. Syncope. EXAM: CHEST - 2 VIEW COMPARISON:  10/31/2019 FINDINGS: The heart size and pulmonary vascularity are normal. Moderate hiatal hernia. Lungs are clear. No acute bone abnormality. No effusions. Aortic atherosclerosis. IMPRESSION: No acute disease. Moderate hiatal hernia. Electronically Signed   By: Lorriane Shire M.D.   On: 01/02/2020 13:03   CT Head Wo Contrast  Result Date:  01/02/2020 CLINICAL DATA:  Head trauma. Suspect intracranial arterial injury. EXAM: CT HEAD WITHOUT CONTRAST TECHNIQUE: Contiguous axial images were obtained from the base of the skull through the vertex without intravenous contrast. COMPARISON:  September 19, 2019 FINDINGS: Brain: No evidence of acute infarction, hemorrhage, hydrocephalus, extra-axial collection or mass lesion/mass effect. Empty sella. Chronic white matter microvascular ischemic changes. Vascular: No hyperdense vessel. Atherosclerosis of the bilateral carotid siphons. Skull: Demineralized. No skull fracture. Stable small benign dural calcification. Sinuses/Orbits: Mild paranasal sinus mucosal thickening. Normal orbital soft tissues. 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 Cerumen in the left external auditory canal. Trace bilateral mastoid fluid. IMPRESSION: No CT evidence of acute intracranial abnormality. Electronically Signed   By: Revonda Humphrey   On: 01/02/2020 15:01   CT CHEST WO CONTRAST  Result Date: 01/02/2020 CLINICAL DATA:  Syncope. Back pain. Patient cannot tolerate intravenous contrast. EXAM: CT CHEST, ABDOMEN AND PELVIS WITHOUT CONTRAST TECHNIQUE: Multidetector CT imaging of the chest, abdomen and pelvis was performed following the standard protocol without IV contrast. COMPARISON:  None. FINDINGS: CT CHEST FINDINGS Cardiovascular: Heart normal in size. Three-vessel coronary artery calcifications. No pericardial effusion. Aortic atherosclerotic calcifications. No displacement of calcifications away from the aortic wall to suggest a dissection. Atherosclerotic calcifications extend into the aortic arch branch vessels. Mediastinum/Nodes: No neck base, mediastinal or hilar masses or enlarged lymph nodes. Trachea and esophagus are unremarkable. Moderate hiatal hernia. Lungs/Pleura: Minor dependent lung base atelectasis. Moderate centrilobular emphysema. No evidence of pneumonia or pulmonary edema. No mass or suspicious  nodule. No pleural effusion or pneumothorax. Musculoskeletal: No fracture or acute finding. No aggressive bone lesion. Significant degenerative changes throughout the thoracic spine. Arthropathic changes of both shoulders, a pattern consistent with an inflammatory arthropathy. CT ABDOMEN PELVIS FINDINGS Hepatobiliary: No focal liver abnormality is seen. No gallstones, gallbladder wall thickening, or biliary dilatation. Pancreas: Unremarkable. No pancreatic ductal dilatation or surrounding inflammatory changes. Spleen: Normal in size without focal abnormality. Adrenals/Urinary Tract: No adrenal masses. Right renal cortical thinning with a reduced right kidney size. Normal size of the left kidney. No renal masses. No collecting system stones. No hydronephrosis. Ureters not well defined, but grossly normal in course and caliber. Bladder is decompressed, otherwise unremarkable. Stomach/Bowel: Moderate hiatal hernia. Stomach otherwise unremarkable. Small bowel and colon are normal in caliber. No wall thickening or inflammation. Moderate increased colonic stool. Left colon diverticula. No diverticulitis. Normal appendix. Vascular/Lymphatic: Diffuse aortic atherosclerosis extending into the branch vessels. 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 Reproductive: Densely calcified small uterine fibroid. Otherwise unremarkable. Other: No hernia or ascites. Musculoskeletal: No acute fracture. No aggressive bone lesion. 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 Previous L4-L5 lumbar spine fusion. Degenerative changes noted of the lumbar spine. IMPRESSION: 1. No acute findings within the chest, abdomen or pelvis. 2. Exam limited for assessment of aortic dissection. Allowing for this, there is no displacement of calcifications along the aortic intima to suggest a dissection. 3. Moderate emphysema. 4. Right renal atrophy. 5. Colonic diverticula and  moderate increased colonic  stool burden. No bowel inflammation. 6. Moderate hiatal hernia. 7. Diffuse aortic atherosclerosis. 8. No acute skeletal abnormality. Significant thoracolumbar degenerative changes. Bilateral shoulder arthropathic changes suggesting an inflammatory arthropathy such as rheumatoid arthritis. Electronically Signed   By: Lajean Manes M.D.   On: 01/02/2020 15:19   CT Cervical Spine Wo Contrast  Result Date: 01/02/2020 CLINICAL DATA:  Syncope.  Chest pain. EXAM: CT CERVICAL SPINE WITHOUT CONTRAST TECHNIQUE: Multidetector CT imaging of the cervical spine was performed without intravenous contrast. Multiplanar CT image reconstructions were also generated. COMPARISON:  09/19/2019 FINDINGS: Alignment: Grade 1 anterolisthesis, C4 on C5 and C7 on T1, both degenerative. No traumatic subluxation. Skull base and vertebrae: No acute fracture. No primary bone lesion or focal pathologic process. Soft tissues and spinal canal: No prevertebral fluid or swelling. No visible canal hematoma. Disc levels: There are significant degenerative changes throughout the cervical spine with marked loss of disc height at C3-C4, moderate loss of disc height C5-C6 and C6-C7 and marked loss of disc height C7-T1. There are bilateral facet degenerative changes. No convincing disc herniation. Upper chest: No acute findings. Lung apices show emphysema. Is mild enlargement of the thyroid gland with small hypo attenuation areas consistent nodules, stable. No specific imaging follow-up recommended. Other: None. IMPRESSION: 1. No fracture or acute finding. 2. Advanced degenerative changes which are unchanged from the prior cervical CT. Electronically Signed   By: Lajean Manes M.D.   On: 01/02/2020 15:23    Procedures .Critical Care Performed by: Tedd Sias, PA Authorized by: Tedd Sias, PA   Critical care provider statement:    Critical care time (minutes):  35   Critical care time was exclusive of:   Separately billable procedures and treating other patients and teaching time   Critical care was necessary to treat or prevent imminent or life-threatening deterioration of the following conditions:  Renal failure   Critical care was time spent personally by me on the following activities:  Discussions with consultants, evaluation of patient's response to treatment, examination of patient, review of old charts, re-evaluation of patient's condition, pulse oximetry, ordering and review of radiographic studies, ordering and review of laboratory studies and ordering and performing treatments and interventions   I assumed direction of critical care for this patient from another provider in my specialty: no     (including critical care time)  Medications Ordered in ED Medications  sodium chloride flush (NS) 0.9 % injection 3 mL (has no administration in time range)  hydrALAZINE (APRESOLINE) injection 10 mg (has no administration in time range)  morphine 4 MG/ML injection 4 mg (4 mg Intravenous Given 01/02/20 1437)  sodium chloride 0.9 % bolus 1,000 mL (1,000 mLs Intravenous New Bag/Given 01/02/20 1650)    ED Course  I have reviewed the triage vital signs and the nursing notes.  Pertinent labs & imaging results that were available during my care of the patient were reviewed by me and considered in my medical decision making (see chart for details).   Patient is 79 year old female with a significant past medical history please see above.  Presented today with syncope and shortness of breath and chest pain.  Concerning for thoracic artery dissection versus pulmonary embolism versus ACS versus AAA.  Patient has significant elevation in creatinine unable to do contrast studies.  Noncontrast CT chest abdomen pelvis obtained to rule out AAA and evaluate for any evidence of luminal abnormalities on aorta.  Discussed with Dr. Einar Gip who recommended VQ scan if dimer is elevated.  Clinical Course as of Jan 02 1748  Wed Jan 02, 2020  1525 CT chest abdomen pelvis without contrast conducted as patient has decreased renal function and is unable to have contrast study at this time.  1. No acute findings within the chest, abdomen or pelvis. 2. Exam limited for assessment of aortic dissection. Allowing for this, there is no displacement of calcifications along the aortic intima to suggest a dissection. 3. Moderate emphysema. 4. Right renal atrophy. 5. Colonic diverticula and moderate increased colonic stool burden. No bowel inflammation. 6. Moderate hiatal hernia. 7. Diffuse aortic atherosclerosis. 8. No acute skeletal abnormality. Significant thoracolumbar degenerative changes. Bilateral shoulder arthropathic changes suggesting an inflammatory arthropathy such as rheumatoid arthritis.   [WF]  1526 CT of head and C-spine without contrast have no acute abnormalities.   [WF]  2111 Patient with a stable anemia no leukocytosis.  BMP within normal limits.  Troponin mildly elevated however trending downwards.  EKG with no acute abnormalities.   [WF]  7356 Dr. Darrick Meigs will see patient for admission to hospital.    [WF]  1748 Troponins x2 mildly elevated from baseline but flat.  BMP within normal limits.  CK mildly elevated at 1000, D-dimer elevated 2.93.  VQ scan ordered.  BMP notable for potassium of 6 CO2 of 9, AKI on top of chronic kidney disease.  Patient's baseline creatinine is 2 it is 2.82 today.  Patient given 1 L normal saline and hydralazine for hypertension.   [WF]    Clinical Course User Index [WF] Tedd Sias, [place]    D-dimer is elevated.  VQ scan ordered and pending.  Will admit to hospital service at this time.  MDM Rules/Calculators/A&P HEAR Score: 7                    Patient noted for high risk syncope.  I discussed this case with my attending physician who cosigned this note including patient's presenting symptoms, physical exam, and planned diagnostics and interventions.  Attending physician stated agreement with plan or made changes to plan which were implemented.   Attending physician assessed patient at bedside.   Final Clinical Impression(s) / ED Diagnoses Final diagnoses:  Chest pain, unspecified type  Shortness of breath  Abdominal pain, unspecified abdominal location  Syncope, unspecified syncope type  AKI (acute kidney injury) [facility])  Elevated d-dimer    Rx / DC Orders ED Discharge Orders    None       Tedd Sias, [place] 01/02/20 1751    Valarie Merino, MD 01/06/20 1055

## 2020-01-02 NOTE — ED Notes (Signed)
Report attempted 

## 2020-01-02 NOTE — ED Notes (Signed)
Pt transported to CT ?

## 2020-01-02 NOTE — ED Notes (Signed)
Pt being transported to [facility].  

## 2020-01-02 NOTE — ED Triage Notes (Signed)
Pt called EMS for chest pain, on their arrival pt was lying on the ground conscious and alert, but family sts she had a witnessed syncopal event prior to EMS arrival. 324 ASA given PTA. Pt reports some relief in chest pain.

## 2020-01-02 NOTE — H&P (Signed)
TRH H&P    Patient Demographics:    Alyssa [name], is a 79 y.o. female  MRN: [id number]  DOB - 1940/12/14  Admit Date - 01/02/2020  Referring MD/NP/PA: Pati Gallo  Outpatient Primary MD for the patient is Loretha Brasil, FNP  Patient coming from: Home  Chief complaint-chest pain   HPI:    Alyssa [name]  is a 79 y.o. female, with history of CKD stage IV, solitary kidney, peripheral vascular disease, hypertension, anemia, subarachnoid hemorrhage managed conservatively in December 2020, antiplatelet therapy was restarted on September 23, 2019, dementia was brought to the hospital after patient was found on the floor.  She complained of severe shortness of breath and chest pressure/mild chest pain for past 3 days.  There is questionable history of syncope as patient says that she passed out but as per son she was on lying on the floor but did not pass out.  In the ED troponin x2 were unremarkable.  She was found to have elevated D-dimer, CTA chest could not be obtained as patient has worsening renal function.  She denies nausea vomiting or diarrhea. Denies abdominal pain or dysuria. No previous history of stroke or seizures. She was found to have elevated blood pressure 186/99. 10 mg IV hydralazine x1 was given in the ED.    Review of systems:    In addition to the HPI above,    All other systems reviewed and are negative.    Past History of the following :    Past Medical History:  Diagnosis Date  . Acute respiratory failure with hypoxia ([place]) 11/01/2019  . Anxiety   . Arthritis   . Chest pain   . Chronic kidney disease   . Dyspnea   . Elevated troponin 12/13/2018  . Headache(784.0)   . Hyperlipidemia   . Hypertension   . Joint pain   . Leg pain   . Peripheral neuropathy   . Peripheral vascular disease [facility])       Past Surgical History:  Procedure Laterality Date  . ABDOMINAL  ANGIOGRAM N/A 10/29/2011   Procedure: ABDOMINAL ANGIOGRAM;  Surgeon: Elam Dutch, MD;  Location: [facility] CATH LAB;  Service: Cardiovascular;  Laterality: N/A;  . BACK SURGERY    . FEMORAL-FEMORAL BYPASS GRAFT  08/31/2010  . FLEXIBLE SIGMOIDOSCOPY N/A 06/12/2019   Procedure: FLEXIBLE SIGMOIDOSCOPY;  Surgeon: Ladene Artist, MD;  Location: [facility] ENDOSCOPY;  Service: Endoscopy;  Laterality: N/A;  Patient had little bit to eat this morning so this will be unsedated.  Marland Kitchen JOINT REPLACEMENT Right 2012   Hip  . LOWER EXTREMITY ANGIOGRAPHY  06/14/2017   Procedure: Lower Extremity Angiography;  Surgeon: Adrian Prows, MD;  Location: [place] CV LAB;  Service: Cardiovascular;;  Bilateral limited angio performed  . RENAL ANGIOGRAPHY N/A 06/14/2017   Procedure: Renal Angiography;  Surgeon: Adrian Prows, MD;  Location: [place] CV LAB;  Service: Cardiovascular;  Laterality: N/A;  . TOTAL HIP ARTHROPLASTY     right      Social History:      Social  History   Tobacco Use  . Smoking status: Current Every Day Smoker    Packs/day: 1.00    Years: 27.00    Pack years: 27.00    Types: Cigarettes    Last attempt to quit: 07/23/2019    Years since quitting: 0.4  . Smokeless tobacco: Never Used  Substance Use Topics  . Alcohol use: No       Family History :     Family History  Problem Relation Age of Onset  . Heart attack Mother   . Heart disease Mother   . Hyperlipidemia Mother   . Hypertension Mother   . Heart attack Father   . Heart disease Father        Before age 48  . Hyperlipidemia Father   . Hypertension Father   . Cancer Sister        brain tumor  . Aneurysm Brother        brain  . Colon cancer Neg Hx   . Liver cancer Neg Hx       Home Medications:   Prior to Admission medications   Medication Sig Start Date End Date Taking? Authorizing Provider  acetaminophen (TYLENOL) 325 MG tablet Take 2 tablets (650 mg total) by mouth every 6 (six) hours as needed for mild pain, moderate  pain or fever (or Fever >/= 101). 06/16/19  Yes Hongalgi, Lenis Dickinson, MD  albuterol (PROAIR HFA) 108 (90 Base) MCG/ACT inhaler Inhale 2 puffs into the lungs every 6 (six) hours as needed for wheezing or shortness of breath.   Yes [provider]  aspirin 81 MG EC tablet Take 1 tablet (81 mg total) by mouth daily. 01/09/19  Yes Caren Griffins, MD  aspirin-acetaminophen-caffeine (EXCEDRIN MIGRAINE) [phone] MG tablet Take 2 tablets by mouth every 6 (six) hours as needed for headache or migraine.   Yes [provider]  busPIRone (BUSPAR) 15 MG tablet Take 15 mg by mouth 3 (three) times daily as needed for anxiety. 02/28/19  Yes [provider]  Cholecalciferol (VITAMIN D-3 PO) Take 1 capsule by mouth daily with breakfast.   Yes [provider]  citalopram (CELEXA) 20 MG tablet Take 1 tablet (20 mg total) by mouth daily. 03/23/19 03/22/20 Yes Shelly Coss, MD  clopidogrel (PLAVIX) 75 MG tablet Take 1 tablet (75 mg total) by mouth daily. 01/09/19  Yes Gherghe, Vella Redhead, MD  donepezil (ARICEPT) 5 MG tablet Take 5 mg by mouth daily.   Yes [provider]  DULoxetine (CYMBALTA) 30 MG capsule Take 30 mg by mouth daily.   Yes [provider]  feeding supplement, ENSURE ENLIVE, (ENSURE ENLIVE) LIQD Take 237 mLs by mouth 2 (two) times daily between meals. 06/16/19  Yes Hongalgi, Lenis Dickinson, MD  Ferrous Sulfate (IRON HIGH-POTENCY) 325 MG TABS Take 325 mg by mouth daily with breakfast. 06/30/19  Yes Hongalgi, Lenis Dickinson, MD  gabapentin (NEURONTIN) 100 MG capsule Take 100 mg by mouth 3 (three) times daily. 10/25/19  Yes [provider]  isosorbide-hydrALAZINE (BIDIL) 20-37.5 MG tablet Take 2 tablets by mouth 3 (three) times daily.   Yes [provider]  megestrol (MEGACE) 40 MG tablet Take 40 mg by mouth 2 (two) times daily.   Yes [provider]  metoprolol tartrate (LOPRESSOR) 25 MG tablet Take 2 tablets (50 mg total) by mouth 2 (two) times  daily. Patient taking differently: Take 25 mg by mouth 2 (two) times daily.  06/16/19  Yes Hongalgi, Lenis Dickinson, MD  Multiple  Vitamin (MULTIVITAMIN WITH MINERALS) TABS tablet Take 1 tablet by mouth daily. 06/17/19  Yes Hongalgi, Lenis Dickinson, MD  pantoprazole (PROTONIX) 40 MG tablet Take 1 tablet (40 mg total) by mouth daily. Patient taking differently: Take 40 mg by mouth daily before breakfast.  06/16/19  Yes Hongalgi, Lenis Dickinson, MD  rosuvastatin (CRESTOR) 40 MG tablet Take 1 tablet (40 mg total) by mouth at bedtime. Patient taking differently: Take 40 mg by mouth daily.  06/16/19  Yes Hongalgi, Lenis Dickinson, MD  sodium bicarbonate 650 MG tablet Take 2 tablets (1,300 mg total) by mouth 2 (two) times daily with a meal. Patient taking differently: Take 650 mg by mouth 2 (two) times daily with a meal.  09/22/19  Yes Cristal Deer, MD  temazepam (RESTORIL) 15 MG capsule Take 15 mg by mouth daily as needed (for anxiety).    Yes [provider]  magnesium oxide (MAG-OX) 400 (241.3 Mg) MG tablet Take 1 tablet (400 mg total) by mouth 2 (two) times daily. Patient not taking: Reported on 01/02/2020 03/12/19   Flora Lipps, MD  valproic acid (DEPAKENE) 250 MG capsule Take 1 capsule every night Patient not taking: Reported on 01/02/2020 12/05/19   Cameron Sprang, MD     Allergies:    No Known Allergies   Physical Exam:   Vitals  Blood pressure (!) 186/99, pulse 68, temperature 98.1 F (36.7 C), temperature source Oral, resp. rate 14, height 5\' 3"  (1.6 m), weight 44.5 kg, SpO2 100 %.  1.  General: Appears in no acute distress  2. Psychiatric: Alert, oriented x4, intact insight and judgment  3. Neurologic: Cranial nerves II through grossly intact, no focal deficit noted, motor strength 5/5 in all extremities  4. HEENMT:  Atraumatic normocephalic, extraocular muscles are intact  5. Respiratory : Clear to auscultation bilaterally, no wheezing or crackles auscultated  6. Cardiovascular : S1-S2,  regular, no murmur auscultated  7. Gastrointestinal:  Abdomen is soft, nontender, no organomegaly     Data Review:    CBC Recent Labs  Lab 01/02/20 1246  WBC 7.7  HGB 10.0*  HCT 32.2*  PLT 342  MCV 94.7  MCH 29.4  MCHC 31.1  RDW 15.1   ------------------------------------------------------------------------------------------------------------------  Results for orders placed or performed during the hospital encounter of 01/02/20 (from the past 48 hour(s))  Basic metabolic panel     Status: Abnormal   Collection Time: 01/02/20 12:46 PM  Result Value Ref Range   Sodium 138 135 - 145 mmol/L   Potassium 6.0 (H) 3.5 - 5.1 mmol/L   Chloride 117 (H) 98 - 111 mmol/L   CO2 9 (L) 22 - 32 mmol/L   Glucose, Bld 187 (H) 70 - 99 mg/dL    Comment: Glucose reference range applies only to samples taken after fasting for at least 8 hours.   BUN 55 (H) 8 - 23 mg/dL   Creatinine, Ser 2.82 (H) 0.44 - 1.00 mg/dL   Calcium 9.8 8.9 - 10.3 mg/dL   GFR calc non Af Amer 16 (L) >60 mL/min   GFR calc Af Amer 18 (L) >60 mL/min   Anion gap 12 5 - 15    Comment: Performed at [facility] [address], [place] [address]  CBC     Status: Abnormal   Collection Time: 01/02/20 12:46 PM  Result Value Ref Range   WBC 7.7 4.0 - 10.5 K/uL   RBC 3.40 (L) 3.87 - 5.11 MIL/uL   Hemoglobin 10.0 (L)  12.0 - 15.0 g/dL   HCT 32.2 (L) 36.0 - 46.0 %   MCV 94.7 80.0 - 100.0 fL   MCH 29.4 26.0 - 34.0 pg   MCHC 31.1 30.0 - 36.0 g/dL   RDW 15.1 11.5 - 15.5 %   Platelets 342 150 - 400 K/uL   nRBC 0.0 0.0 - 0.2 %    Comment: Performed at [facility], [place] [address] [place], [place] [address]  Troponin I (High Sensitivity)     Status: Abnormal   Collection Time: 01/02/20 12:46 PM  Result Value Ref Range   Troponin I (High Sensitivity) 53 (H) <18 ng/L    Comment: (NOTE) Elevated high sensitivity troponin I (hsTnI) values and significant  changes across serial measurements may suggest ACS  but many other  chronic and acute conditions are known to elevate hsTnI results.  Refer to the "Links" section for chest pain algorithms and additional  guidance. Performed at [facility], [place] [address] [place], [place] [address]   Troponin I (High Sensitivity)     Status: Abnormal   Collection Time: 01/02/20  2:30 PM  Result Value Ref Range   Troponin I (High Sensitivity) 48 (H) <18 ng/L    Comment: (NOTE) Elevated high sensitivity troponin I (hsTnI) values and significant  changes across serial measurements may suggest ACS but many other  chronic and acute conditions are known to elevate hsTnI results.  Refer to the "Links" section for chest pain algorithms and additional  guidance. Performed at [facility], [place] [address], [place], [place] [address]   Brain natriuretic peptide     Status: None   Collection Time: 01/02/20  2:30 PM  Result Value Ref Range   B Natriuretic Peptide 90.8 0.0 - 100.0 pg/mL    Comment: Performed at [facility] [address], [place], [place] [address]  CK     Status: Abnormal   Collection Time: 01/02/20  2:30 PM  Result Value Ref Range   Total CK 1,075 (H) 38 - 234 U/L    Comment: Performed at [facility], [place] [address], [place], [place] [address]  D-dimer, quantitative (not at [facility])     Status: Abnormal   Collection Time: 01/02/20  4:49 PM  Result Value Ref Range   D-Dimer, Quant 2.93 (H) 0.00 - 0.50 ug/mL-FEU    Comment: (NOTE) At the manufacturer cut-off of 0.50 ug/mL FEU, this assay has been documented to exclude PE with a sensitivity and negative predictive value of 97 to 99%.  At this time, this assay has not been approved by the FDA to exclude DVT/VTE. Results should be correlated with clinical presentation. Performed at [facility], [place] [address] [place], [place] [address]     Chemistries  Recent Labs  Lab 01/02/20 1246  NA 138  K 6.0*  CL 117*  CO2 9*  GLUCOSE 187*  BUN 55*   CREATININE 2.82*  CALCIUM 9.8    Recent Labs  Lab 01/02/20 1430  CKTOTAL 1,075*    --------------------------------------------------------------------------------------------------------------- Urine analysis:    Component Value Date/Time   COLORURINE YELLOW 11/01/2019 0108   APPEARANCEUR CLOUDY (A) 11/01/2019 0108   LABSPEC 1.012 11/01/2019 0108   PHURINE 5.0 11/01/2019 0108   GLUCOSEU NEGATIVE 11/01/2019 0108   HGBUR NEGATIVE 11/01/2019 0108   BILIRUBINUR NEGATIVE 11/01/2019 0108   KETONESUR NEGATIVE 11/01/2019 0108   PROTEINUR 100 (A) 11/01/2019 0108   UROBILINOGEN 0.2 08/24/2015 1737  NITRITE NEGATIVE 11/01/2019 0108   LEUKOCYTESUR LARGE (A) 11/01/2019 0108      Imaging Results:    CT ABDOMEN PELVIS WO CONTRAST  Result Date: 01/02/2020 CLINICAL DATA:  Syncope. Back pain. Patient cannot tolerate intravenous contrast. EXAM: CT CHEST, ABDOMEN AND PELVIS WITHOUT CONTRAST TECHNIQUE: Multidetector CT imaging of the chest, abdomen and pelvis was performed following the standard protocol without IV contrast. COMPARISON:  None. FINDINGS: CT CHEST FINDINGS Cardiovascular: Heart normal in size. Three-vessel coronary artery calcifications. No pericardial effusion. Aortic atherosclerotic calcifications. No displacement of calcifications away from the aortic wall to suggest a dissection. Atherosclerotic calcifications extend into the aortic arch branch vessels. Mediastinum/Nodes: No neck base, mediastinal or hilar masses or enlarged lymph nodes. Trachea and esophagus are unremarkable. Moderate hiatal hernia. Lungs/Pleura: Minor dependent lung base atelectasis. Moderate centrilobular emphysema. No evidence of pneumonia or pulmonary edema. No mass or suspicious nodule. No pleural effusion or pneumothorax. Musculoskeletal: No fracture or acute finding. No aggressive bone lesion. Significant degenerative changes throughout the thoracic spine. Arthropathic changes of both shoulders, a pattern  consistent with an inflammatory arthropathy. CT ABDOMEN PELVIS FINDINGS Hepatobiliary: No focal liver abnormality is seen. No gallstones, gallbladder wall thickening, or biliary dilatation. Pancreas: Unremarkable. No pancreatic ductal dilatation or surrounding inflammatory changes. Spleen: Normal in size without focal abnormality. Adrenals/Urinary Tract: No adrenal masses. Right renal cortical thinning with a reduced right kidney size. Normal size of the left kidney. No renal masses. No collecting system stones. No hydronephrosis. Ureters not well defined, but grossly normal in course and caliber. Bladder is decompressed, otherwise unremarkable. Stomach/Bowel: Moderate hiatal hernia. Stomach otherwise unremarkable. Small bowel and colon are normal in caliber. No wall thickening or inflammation. Moderate increased colonic stool. Left colon diverticula. No diverticulitis. Normal appendix. Vascular/Lymphatic: Diffuse aortic atherosclerosis extending into the branch vessels. 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 Reproductive: Densely calcified small uterine fibroid. Otherwise unremarkable. Other: No hernia or ascites. Musculoskeletal: No acute fracture. No aggressive bone lesion. 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 Previous L4-L5 lumbar spine fusion. Degenerative changes noted of the lumbar spine. IMPRESSION: 1. No acute findings within the chest, abdomen or pelvis. 2. Exam limited for assessment of aortic dissection. Allowing for this, there is no displacement of calcifications along the aortic intima to suggest a dissection. 3. Moderate emphysema. 4. Right renal atrophy. 5. Colonic diverticula and moderate increased colonic stool burden. No bowel inflammation. 6. Moderate hiatal hernia. 7. Diffuse aortic atherosclerosis. 8. No acute skeletal abnormality. Significant thoracolumbar degenerative changes. Bilateral shoulder  arthropathic changes suggesting an inflammatory arthropathy such as rheumatoid arthritis. Electronically Signed   By: Lajean Manes M.D.   On: 01/02/2020 15:19   DG Chest 2 View  Result Date: 01/02/2020 CLINICAL DATA:  Chest pain. Syncope. EXAM: CHEST - 2 VIEW COMPARISON:  10/31/2019 FINDINGS: The heart size and pulmonary vascularity are normal. Moderate hiatal hernia. Lungs are clear. No acute bone abnormality. No effusions. Aortic atherosclerosis. IMPRESSION: No acute disease. Moderate hiatal hernia. Electronically Signed   By: Lorriane Shire M.D.   On: 01/02/2020 13:03   CT Head Wo Contrast  Result Date: 01/02/2020 CLINICAL DATA:  Head trauma. Suspect intracranial arterial injury. EXAM: CT HEAD WITHOUT CONTRAST TECHNIQUE: Contiguous axial images were obtained from the base of the skull through the vertex without intravenous contrast. COMPARISON:  September 19, 2019 FINDINGS: Brain: No evidence of acute infarction, hemorrhage, hydrocephalus, extra-axial collection or mass lesion/mass effect. Empty  sella. Chronic white matter microvascular ischemic changes. Vascular: No hyperdense vessel. Atherosclerosis of the bilateral carotid siphons. Skull: Demineralized. No skull fracture. Stable small benign dural calcification. Sinuses/Orbits: Mild paranasal sinus mucosal thickening. Normal orbital soft tissues. 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 Cerumen in the left external auditory canal. Trace bilateral mastoid fluid. IMPRESSION: No CT evidence of acute intracranial abnormality. Electronically Signed   By: Revonda Humphrey   On: 01/02/2020 15:01   CT CHEST WO CONTRAST  Result Date: 01/02/2020 CLINICAL DATA:  Syncope. Back pain. Patient cannot tolerate intravenous contrast. EXAM: CT CHEST, ABDOMEN AND PELVIS WITHOUT CONTRAST TECHNIQUE: Multidetector CT imaging of the chest, abdomen and pelvis was performed following the standard protocol without IV contrast. COMPARISON:  None. FINDINGS: CT CHEST  FINDINGS Cardiovascular: Heart normal in size. Three-vessel coronary artery calcifications. No pericardial effusion. Aortic atherosclerotic calcifications. No displacement of calcifications away from the aortic wall to suggest a dissection. Atherosclerotic calcifications extend into the aortic arch branch vessels. Mediastinum/Nodes: No neck base, mediastinal or hilar masses or enlarged lymph nodes. Trachea and esophagus are unremarkable. Moderate hiatal hernia. Lungs/Pleura: Minor dependent lung base atelectasis. Moderate centrilobular emphysema. No evidence of pneumonia or pulmonary edema. No mass or suspicious nodule. No pleural effusion or pneumothorax. Musculoskeletal: No fracture or acute finding. No aggressive bone lesion. Significant degenerative changes throughout the thoracic spine. Arthropathic changes of both shoulders, a pattern consistent with an inflammatory arthropathy. CT ABDOMEN PELVIS FINDINGS Hepatobiliary: No focal liver abnormality is seen. No gallstones, gallbladder wall thickening, or biliary dilatation. Pancreas: Unremarkable. No pancreatic ductal dilatation or surrounding inflammatory changes. Spleen: Normal in size without focal abnormality. Adrenals/Urinary Tract: No adrenal masses. Right renal cortical thinning with a reduced right kidney size. Normal size of the left kidney. No renal masses. No collecting system stones. No hydronephrosis. Ureters not well defined, but grossly normal in course and caliber. Bladder is decompressed, otherwise unremarkable. Stomach/Bowel: Moderate hiatal hernia. Stomach otherwise unremarkable. Small bowel and colon are normal in caliber. No wall thickening or inflammation. Moderate increased colonic stool. Left colon diverticula. No diverticulitis. Normal appendix. Vascular/Lymphatic: Diffuse aortic atherosclerosis extending into the branch vessels. 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  Reproductive: Densely calcified small uterine fibroid. Otherwise unremarkable. Other: No hernia or ascites. Musculoskeletal: No acute fracture. No aggressive bone lesion. 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 Previous L4-L5 lumbar spine fusion. Degenerative changes noted of the lumbar spine. IMPRESSION: 1. No acute findings within the chest, abdomen or pelvis. 2. Exam limited for assessment of aortic dissection. Allowing for this, there is no displacement of calcifications along the aortic intima to suggest a dissection. 3. Moderate emphysema. 4. Right renal atrophy. 5. Colonic diverticula and moderate increased colonic stool burden. No bowel inflammation. 6. Moderate hiatal hernia. 7. Diffuse aortic atherosclerosis. 8. No acute skeletal abnormality. Significant thoracolumbar degenerative changes. Bilateral shoulder arthropathic changes suggesting an inflammatory arthropathy such as rheumatoid arthritis. Electronically Signed   By: Lajean Manes M.D.   On: 01/02/2020 15:19   CT Cervical Spine Wo Contrast  Result Date: 01/02/2020 CLINICAL DATA:  Syncope.  Chest pain. EXAM: CT CERVICAL SPINE WITHOUT CONTRAST TECHNIQUE: Multidetector CT imaging of the cervical spine was performed without intravenous contrast. Multiplanar CT image reconstructions were also generated. COMPARISON:  09/19/2019 FINDINGS: Alignment: Grade 1 anterolisthesis, C4 on C5 and C7 on T1, both degenerative. No traumatic subluxation. Skull base and vertebrae: No acute fracture. No primary  bone lesion or focal pathologic process. Soft tissues and spinal canal: No prevertebral fluid or swelling. No visible canal hematoma. Disc levels: There are significant degenerative changes throughout the cervical spine with marked loss of disc height at C3-C4, moderate loss of disc height C5-C6 and C6-C7 and marked loss of disc height C7-T1. There are bilateral facet degenerative changes. No convincing disc herniation. Upper chest: No acute  findings. Lung apices show emphysema. Is mild enlargement of the thyroid gland with small hypo attenuation areas consistent nodules, stable. No specific imaging follow-up recommended. Other: None. IMPRESSION: 1. No fracture or acute finding. 2. Advanced degenerative changes which are unchanged from the prior cervical CT. Electronically Signed   By: Lajean Manes M.D.   On: 01/02/2020 15:23    My personal review of EKG: Rhythm NSR, no ST changes   Assessment & Plan:    Active Problems:   Syncope   1. Chest pain-patient presented with chest pain for past 3 days, felt as pressure, initial troponin x2 - in the ED.  EKG is unremarkable.  Will admit to hospital, rule out NSTEMI, obtain serial troponin.  Dr. Einar Gip is patient's cardiologist, he was consulted.  CT chest abdomen and pelvis without contrast was obtained.  It did not show aortic dissection, assessment was limited without IV contrast.  VQ scan has been ordered.  We will follow the results. 2. Hyperkalemia-potassium is 6.0, will give 1 dose of Kayexalate 30 g p.o. x1.  Follow BMP in a.m. 3. ?  Syncope-patient says that she passed out however as per patient's son she did not pass out.  Will obtain echocardiogram in a.m.  Monitor closely on telemetry.  CT head is unremarkable. 4. Acute kidney injury on CKD stage IV-patient baseline creatinine is around 1.6-1.9.  Today presenting with creatinine of 2.82, BUN is elevated at 55.  Started on gentle IV hydration with normal saline at 75 mL/h.  Follow BMP in a.m. 5. Hypertension-blood pressure is elevated, continue home medications including BiDil, will increase the dose of metoprolol to 50 mg p.o. twice daily.  Patient has been taking reduced dose of 25 mg p.o. twice daily at home.  She was prescribed 50 mg p.o. twice daily as per [facility]. 6. History of depression-continue Celexa, patient has stopped taking Depakote as per MAR. 7. History of peripheral disease-continue statins, aspirin and Plavix   DVT  Prophylaxis-   Heparin  AM Labs Ordered, also please review Full Orders  Family Communication: Admission, patients condition and plan of care including tests being ordered have been discussed with the patient  who indicate understanding and agree with the plan and Code Status.  Code Status: Full code  Admission status: Inpatient :The appropriate admission status for this patient is INPATIENT. Inpatient status is judged to be reasonable and necessary in order to provide the required intensity of service to ensure the patient's safety. The patient's presenting symptoms, physical exam findings, and initial radiographic and laboratory data in the context of their chronic comorbidities is felt to place them at high risk for further clinical deterioration. Furthermore, it is not anticipated that the patient will be medically stable for discharge from the hospital within 2 midnights of admission. The following factors support the admission status of inpatient.     The patient's presenting symptoms include chest pain,?  Syncope. The worrisome physical exam findings include  The initial radiographic and laboratory data are worrisome because of syncope, chest pain, rule out NSTEMI, acute kidney injury on CKD stage III  The chronic co-morbidities include peripheral vascular disease, hypertension, hyperlipidemia       * I certify that at the point of admission it is my clinical judgment that the patient will require inpatient hospital care spanning beyond 2 midnights from the point of admission due to high intensity of service, high risk for further deterioration and high frequency of surveillance required.*  Time spent in minutes : 60 minutes   Emmajane Altamura S Denee Boeder M.D

## 2020-01-02 NOTE — ED Notes (Signed)
Per Dr Darrick Meigs, hold kayexalate until after VQ scan.

## 2020-01-03 ENCOUNTER — Inpatient Hospital Stay (HOSPITAL_BASED_OUTPATIENT_CLINIC_OR_DEPARTMENT_OTHER): Payer: Medicare HMO

## 2020-01-03 ENCOUNTER — Encounter (HOSPITAL_COMMUNITY): Payer: Self-pay | Admitting: Family Medicine

## 2020-01-03 DIAGNOSIS — N179 Acute kidney failure, unspecified: Secondary | ICD-10-CM | POA: Diagnosis not present

## 2020-01-03 DIAGNOSIS — N184 Chronic kidney disease, stage 4 (severe): Secondary | ICD-10-CM

## 2020-01-03 DIAGNOSIS — F418 Other specified anxiety disorders: Secondary | ICD-10-CM

## 2020-01-03 DIAGNOSIS — R55 Syncope and collapse: Secondary | ICD-10-CM | POA: Diagnosis not present

## 2020-01-03 DIAGNOSIS — I10 Essential (primary) hypertension: Secondary | ICD-10-CM

## 2020-01-03 DIAGNOSIS — E875 Hyperkalemia: Secondary | ICD-10-CM

## 2020-01-03 DIAGNOSIS — F0280 Dementia in other diseases classified elsewhere without behavioral disturbance: Secondary | ICD-10-CM

## 2020-01-03 LAB — CBC
HCT: 31.7 % — ABNORMAL LOW (ref 36.0–46.0)
Hemoglobin: 10.3 g/dL — ABNORMAL LOW (ref 12.0–15.0)
MCH: 30.1 pg (ref 26.0–34.0)
MCHC: 32.5 g/dL (ref 30.0–36.0)
MCV: 92.7 fL (ref 80.0–100.0)
Platelets: 288 10*3/uL (ref 150–400)
RBC: 3.42 MIL/uL — ABNORMAL LOW (ref 3.87–5.11)
RDW: 15.1 % (ref 11.5–15.5)
WBC: 8.4 10*3/uL (ref 4.0–10.5)
nRBC: 0 % (ref 0.0–0.2)

## 2020-01-03 LAB — COMPREHENSIVE METABOLIC PANEL
ALT: 14 U/L (ref 0–44)
AST: 22 U/L (ref 15–41)
Albumin: 3.7 g/dL (ref 3.5–5.0)
Alkaline Phosphatase: 61 U/L (ref 38–126)
Anion gap: 13 (ref 5–15)
BUN: 54 mg/dL — ABNORMAL HIGH (ref 8–23)
CO2: 8 mmol/L — ABNORMAL LOW (ref 22–32)
Calcium: 9.3 mg/dL (ref 8.9–10.3)
Chloride: 121 mmol/L — ABNORMAL HIGH (ref 98–111)
Creatinine, Ser: 2.37 mg/dL — ABNORMAL HIGH (ref 0.44–1.00)
GFR calc Af Amer: 22 mL/min — ABNORMAL LOW (ref >60)
GFR calc non Af Amer: 19 mL/min — ABNORMAL LOW (ref >60)
Glucose, Bld: 108 mg/dL — ABNORMAL HIGH (ref 70–99)
Potassium: 4.7 mmol/L (ref 3.5–5.1)
Sodium: 142 mmol/L (ref 135–145)
Total Bilirubin: 0.7 mg/dL (ref 0.3–1.2)
Total Protein: 6.3 g/dL — ABNORMAL LOW (ref 6.5–8.1)

## 2020-01-03 LAB — ECHOCARDIOGRAM COMPLETE
Height: 63 in
Weight: 1425.05 oz

## 2020-01-03 LAB — CREATININE, SERUM
Creatinine, Ser: 2.45 mg/dL — ABNORMAL HIGH (ref 0.44–1.00)
GFR calc Af Amer: 21 mL/min — ABNORMAL LOW (ref >60)
GFR calc non Af Amer: 19 mL/min — ABNORMAL LOW (ref >60)

## 2020-01-03 LAB — TROPONIN I (HIGH SENSITIVITY)
Troponin I (High Sensitivity): 33 ng/L — ABNORMAL HIGH (ref <18)
Troponin I (High Sensitivity): 26 ng/L — ABNORMAL HIGH (ref <18)

## 2020-01-03 MED ORDER — ENSURE ENLIVE PO LIQD
237.0000 mL | Freq: Three times a day (TID) | ORAL | Status: DC
  Administered 2020-01-03 – 2020-01-08 (×8): 237 mL via ORAL

## 2020-01-03 MED ORDER — SODIUM BICARBONATE-DEXTROSE 150-5 MEQ/L-% IV SOLN
150.0000 meq | INTRAVENOUS | Status: DC
  Filled 2020-01-03 (×2): qty 1000

## 2020-01-03 MED ORDER — ADULT MULTIVITAMIN W/MINERALS CH
1.0000 | ORAL_TABLET | Freq: Every day | ORAL | Status: DC
  Administered 2020-01-03 – 2020-01-08 (×6): 1 via ORAL
  Filled 2020-01-03 (×6): qty 1

## 2020-01-03 MED ORDER — METOPROLOL TARTRATE 25 MG PO TABS
25.0000 mg | ORAL_TABLET | Freq: Two times a day (BID) | ORAL | Status: DC
  Administered 2020-01-03 – 2020-01-05 (×4): 25 mg via ORAL
  Filled 2020-01-03 (×4): qty 1

## 2020-01-03 MED ORDER — SODIUM BICARBONATE 8.4 % IV SOLN
INTRAVENOUS | Status: DC
  Filled 2020-01-03 (×3): qty 850

## 2020-01-03 MED ORDER — SODIUM BICARBONATE-DEXTROSE 150-5 MEQ/L-% IV SOLN
150.0000 meq | INTRAVENOUS | Status: DC
  Filled 2020-01-03: qty 1000

## 2020-01-03 NOTE — Progress Notes (Signed)
  Echocardiogram 2D Echocardiogram has been performed.  Alyssa [name] 01/03/2020, 9:16 AM

## 2020-01-03 NOTE — Plan of Care (Signed)
  Problem: Clinical Measurements: Goal: Diagnostic test results will improve Outcome: Progressing Goal: Respiratory complications will improve Outcome: Progressing   

## 2020-01-03 NOTE — Progress Notes (Addendum)
PROGRESS NOTE    Alyssa [name]  KGS:[id number] DOB: 05-04-1941 DOA: 01/02/2020 PCP: Loretha Brasil, FNP    Brief Narrative:  Patient was admitted to the hospital with a working diagnosis of acute kidney injury on chronic disease stage IV, complicated by hyperkalemia and syncope.   79 year old female who presented with chest pain, she does have significant past medical history for chronic kidney stage IV, peripheral vascular disease, hypertension, anemia and history of subarachnoid hemorrhage (medically managed 09/2019) and dementia.  She reported poor appetite over the last week, associated with generalized weakness.    Over the last 3 days developed dyspnea and chest pressure.  On the day of admission, she woke up and took a shower, she dressed up in the bathroom. While walking out of the bathroom she felt dizzy, lightheaded, hot and lost consciousness.  Denies any head trauma.  Before fainting she was on the phone with the transport agency who was supposed to take her to her doctor for a regular visit.  She told the operator that she was not feeling well and to call for help.  15 minutes later she recovered her consciousness, she was awake when her family and EMS arrived.  She was brought to the hospital for further evaluation. She was using her walker at the time of syncope episode.   On her initial physical examination blood pressure 186/99, pulse rate 68, temperature 98.1, respiratory rate 14, oxygen saturation 100%.  Her lungs were clear to auscultation bilaterally, heart S1-S2 present rhythmic soft abdomen, no lower extremity edema, neurologically nonfocal. Sodium 138, potassium 6.0, chloride 117, bicarb 9, glucose 187, and 55, creatinine 2.8, white count 7.7, hemoglobin 10, hematocrit 32.2, platelets 342, d dimer 2.9 CK 1075, troponin I 48.  SARS COVID-19 negative.  CT head, cervical spine, chest and abdomen negative for acute changes.  EKG 67 bpm, normal axis, normal intervals, sinus  rhythm, positive LVH, no significant ST segment or T wave changes.  Assessment & Plan:   Principal Problem:   AKI (acute kidney injury) ([place]) Active Problems:   PVD (peripheral vascular disease) with claudication (HCC)   Gait difficulty   Syncope   Essential hypertension   Hyperlipidemia   CKD (chronic kidney disease) stage 4, GFR 15-29 ml/min (HCC)   Depression with anxiety   SAH (subarachnoid hemorrhage) (HCC)   Dementia (HCC)   Hyperkalemia   1. AKI on CKD stage IV/ non anion gap metabolic acidosis. Renal function this am with serum cr down to 2,37. K down to 4,7 and serum bicarbonate at 8. Blood pressure 585 mmHg systolic, clinically continue to be hypovolemic.   Will continue hydration with bicarb drip at 75 ml per H and will follow up on renal function in am. Avoid hypotension and nephrotoxic medications. Follow Ck in am, along with urine output.   Patient continue to be at high risk for worsening renal function.   2. HTN. Will hold on blood pressure medications for now due to risk of hypotension. Will keep metoprolol to prevent rebound hypertension but will decrease dose to 25 mg po bid.   3. Syncope. Likely multifactorial, considering AKI and hypovolemia, can not rule out orthostatic. Will continue hydration with IV fluids and will plan to check on orthostatic vitals in am. Continue PT and OT.   V/Q scan low probability for pulmonary embolism. Follow up on echocardiogram.   4. Dementia. Continue with donepezil, buspirone, duloxetine, temazepam and citalopram. Consult nutrition.   5. Iron deficiency anemia. Continue iron  supplements.   6. PVD. Continue statin, asa and clopidogrel.   DVT prophylaxis: heparin   Code Status:  full Family Communication: no family at the bedside  Disposition Plan/ discharge barriers: patient from home, barrier for dc AKI on CKD stage IV with acidosis, needing IV fluids with bicarb infusion. Possible dc home in am.    Subjective: Patient  is feeling better, continue to have mild right sided pain, with no nausea or vomiting, has not been out of bed today. At home uses a walker for ambulation.   Objective: Vitals:   01/03/20 0613 01/03/20 0656 01/03/20 0659 01/03/20 0957  BP: 132/76 114/72 117/75 106/74  Pulse: 80 73 74 68  Resp: 17 15 18 16   Temp:   98.7 F (37.1 C) 98.5 F (36.9 C)  TempSrc:   Oral Oral  SpO2: 99% 100% 100% 99%  Weight:      Height:        Intake/Output Summary (Last 24 hours) at 01/03/2020 1230 Last data filed at 01/03/2020 0900 Gross per 24 hour  Intake 2004.77 ml  Output 375 ml  Net 1629.77 ml   Filed Weights   01/02/20 1233 01/02/20 2157 01/03/20 0529  Weight: 44.5 kg 40.4 kg 40.4 kg    Examination:   General: Not in pain or dyspnea, deconditioned  Neurology: Awake and alert, non focal  E ENT: mild pallor, no icterus, oral mucosa moist Cardiovascular: No JVD. S1-S2 present, rhythmic, no gallops, rubs, or murmurs. No lower extremity edema. Pulmonary: positive breath sounds bilaterally, adequate air movement, no wheezing, rhonchi or rales. Gastrointestinal. Abdomen with, no organomegaly, non tender, no rebound or guarding Skin. No rashes Musculoskeletal: no joint deformities     Data Reviewed: I have personally reviewed following labs and imaging studies  CBC: Recent Labs  Lab 01/02/20 1246 01/02/20 2328 01/03/20 0207  WBC 7.7 9.3 8.4  HGB 10.0* 10.7* 10.3*  HCT 32.2* 34.6* 31.7*  MCV 94.7 97.7 92.7  PLT 342 309 676   Basic Metabolic Panel: Recent Labs  Lab 01/02/20 1246 01/02/20 2328 01/03/20 0602  NA 138  --  142  K 6.0*  --  4.7  CL 117*  --  121*  CO2 9*  --  8*  GLUCOSE 187*  --  108*  BUN 55*  --  54*  CREATININE 2.82* 2.45* 2.37*  CALCIUM 9.8  --  9.3   GFR: Estimated Creatinine Clearance: 12.9 mL/min (A) (by C-G formula based on SCr of 2.37 mg/dL (H)). Liver Function Tests: Recent Labs  Lab 01/03/20 0602  AST 22  ALT 14  ALKPHOS 61  BILITOT 0.7   PROT 6.3*  ALBUMIN 3.7   No results for input(s): LIPASE, AMYLASE in the last 168 hours. No results for input(s): AMMONIA in the last 168 hours. Coagulation Profile: No results for input(s): INR, PROTIME in the last 168 hours. Cardiac Enzymes: Recent Labs  Lab 01/02/20 1430  CKTOTAL 1,075*   BNP (last 3 results) No results for input(s): PROBNP in the last 8760 hours. HbA1C: No results for input(s): HGBA1C in the last 72 hours. CBG: No results for input(s): GLUCAP in the last 168 hours. Lipid Profile: No results for input(s): CHOL, HDL, LDLCALC, TRIG, CHOLHDL, LDLDIRECT in the last 72 hours. Thyroid Function Tests: No results for input(s): TSH, T4TOTAL, FREET4, T3FREE, THYROIDAB in the last 72 hours. Anemia Panel: No results for input(s): VITAMINB12, FOLATE, FERRITIN, TIBC, IRON, RETICCTPCT in the last 72 hours.    Radiology Studies: I have  reviewed all of the imaging during this hospital visit personally     Scheduled Meds: . aspirin EC  81 mg Oral Daily  . citalopram  20 mg Oral Daily  . clopidogrel  75 mg Oral Daily  . donepezil  5 mg Oral Daily  . DULoxetine  30 mg Oral Daily  . ferrous sulfate  325 mg Oral Q breakfast  . gabapentin  100 mg Oral TID  . heparin  5,000 Units Subcutaneous Q8H  . isosorbide-hydrALAZINE  2 tablet Oral TID  . metoprolol tartrate  50 mg Oral BID  . pantoprazole  40 mg Oral QAC breakfast  . rosuvastatin  40 mg Oral Daily  . sodium bicarbonate  650 mg Oral BID WC  . sodium chloride flush  3 mL Intravenous Once   Continuous Infusions: . sodium chloride 75 mL/hr at 01/02/20 2311     LOS: 1 day        Advika Mclelland Gerome Apley, MD

## 2020-01-03 NOTE — Care Management Obs Status (Signed)
[facility] NOTIFICATION   Patient Details  Name: Alyssa [name] MRN: [id number] Date of Birth: 05-Apr-1941   Medicare Observation Status Notification Given:  Yes    Sharin Mons, RN 01/03/2020, 4:35 PM

## 2020-01-03 NOTE — Care Management CC44 (Signed)
Condition Code 44 Documentation Completed  Patient Details  Name: Alyssa [name] MRN: [id number] Date of Birth: 1941/05/14   Condition Code 44 given:  Yes Patient signature on Condition Code 44 notice:  Yes Documentation of 2 MD's agreement:  Yes Code 44 added to claim:  Yes    Sharin Mons, RN 01/03/2020, 4:35 PM

## 2020-01-03 NOTE — Evaluation (Signed)
Physical Therapy Evaluation Patient Details Name: Alyssa [name] MRN: [id number] DOB: 05/26/1941 Today's Date: 01/03/2020   History of Present Illness  Pt is a 79 y/o female admitted secondary to syncope and chest pressure. Found to have AKI. PMH includes PVD, HTN, CKD, dementia, and R THA.   Clinical Impression  Pt admitted secondary to problem above with deficits below. Pt requiring mod A to stand secondary to increased instability. Pt very shaky in standing, so further mobility deferred. Feel pt would benefit from SNF level therapies at d/c given current deficits. However, if pt refuses will require max HH services. Will continue to follow acutely to maximize functional mobility independence and safety.     Follow Up Recommendations SNF;Supervision/Assistance - 24 hour(max HH if pt refuses)    Equipment Recommendations  Other (comment)(TBD)    Recommendations for Other Services       Precautions / Restrictions Precautions Precautions: Fall Restrictions Weight Bearing Restrictions: No      Mobility  Bed Mobility Overal bed mobility: Needs Assistance Bed Mobility: Supine to Sit;Sit to Supine     Supine to sit: Min assist Sit to supine: Min assist   General bed mobility comments: Min A for trunk assist to come to sitting. Min A for LE assist for return to supien.   Transfers Overall transfer level: Needs assistance Equipment used: 1 person hand held assist Transfers: Sit to/from Stand Sit to Stand: Mod assist         General transfer comment: Mod A for steadying assist. Pt with increased instability in standing, so further mobility deferred  Ambulation/Gait                Stairs            Wheelchair Mobility    Modified Rankin (Stroke Patients Only)       Balance Overall balance assessment: Needs assistance Sitting-balance support: No upper extremity supported;Feet supported Sitting balance-Leahy Scale: Fair     Standing balance  support: Bilateral upper extremity supported;During functional activity Standing balance-Leahy Scale: Poor Standing balance comment: Reliant on UE and external support                              Pertinent Vitals/Pain Pain Assessment: 0-10 Pain Score: 10-Worst pain ever Pain Location: R side chest Pain Descriptors / Indicators: Grimacing;Guarding Pain Intervention(s): Limited activity within patient's tolerance;Monitored during session    [place] expects to be discharged to:: Private residence Living Arrangements: Spouse/significant other(boyfriend) Available Help at Discharge: Family;Available 24 hours/day Type of Home: House Home Access: Stairs to enter Entrance Stairs-Rails: Right;Left;Can reach both Entrance Stairs-Number of Steps: 3 Home Layout: One level Home Equipment: Walker - 2 wheels;Walker - 4 wheels;Bedside commode;Shower seat      Prior Function Level of Independence: Independent with assistive device(s)         Comments: Reports using cane for ambulation      Hand Dominance        Extremity/Trunk Assessment   Upper Extremity Assessment Upper Extremity Assessment: Defer to OT evaluation    Lower Extremity Assessment Lower Extremity Assessment: RLE deficits/detail RLE Deficits / Details: RLE leg length discrepancy.     Cervical / Trunk Assessment Cervical / Trunk Assessment: Normal  Communication   Communication: HOH  Cognition Arousal/Alertness: Awake/alert Behavior During Therapy: WFL for tasks assessed/performed Overall Cognitive Status: History of cognitive impairments - at baseline  General Comments: Dementia at baseline      General Comments      Exercises     Assessment/Plan    PT Assessment Patient needs continued PT services  PT Problem List Decreased strength;Decreased balance;Decreased activity tolerance;Decreased mobility;Decreased knowledge of use  of DME;Decreased knowledge of precautions;Pain;Decreased cognition;Decreased safety awareness       PT Treatment Interventions Gait training;Functional mobility training;Stair training;DME instruction;Therapeutic activities;Therapeutic exercise;Balance training;Patient/family education    PT Goals (Current goals can be found in the Care Plan section)  Acute Rehab PT Goals Patient Stated Goal: to decrease pain  PT Goal Formulation: With patient Time For Goal Achievement: 01/17/20 Potential to Achieve Goals: Good    Frequency Min 3X/week   Barriers to discharge        Co-evaluation               AM-PAC PT "6 Clicks" Mobility  Outcome Measure Help needed turning from your back to your side while in a flat bed without using bedrails?: A Little Help needed moving from lying on your back to sitting on the side of a flat bed without using bedrails?: A Little Help needed moving to and from a bed to a chair (including a wheelchair)?: A Lot Help needed standing up from a chair using your arms (e.g., wheelchair or bedside chair)?: A Lot Help needed to walk in hospital room?: A Lot Help needed climbing 3-5 steps with a railing? : Total 6 Click Score: 13    End of Session Equipment Utilized During Treatment: Gait belt Activity Tolerance: Patient limited by pain Patient left: in bed;with call bell/phone within reach;with bed alarm set Nurse Communication: Mobility status PT Visit Diagnosis: Unsteadiness on feet (R26.81);Muscle weakness (generalized) (M62.81)    Time: 1640-1700 PT Time Calculation (min) (ACUTE ONLY): 20 min   Charges:   PT Evaluation $PT Eval Moderate Complexity: 1 Mod          Alyssa [name], PT, DPT  Acute Rehabilitation Services  Pager: [phone] Office: [phone]   Rudean Hitt 01/03/2020, 6:50 PM

## 2020-01-03 NOTE — Progress Notes (Signed)
Initial Nutrition Assessment  DOCUMENTATION CODES:   Severe malnutrition in context of chronic illness  INTERVENTION:   Ensure Enlive po TID, each supplement provides 350 kcal and 20 grams of protein  MVI daily  Snacks TID    NUTRITION DIAGNOSIS:   Severe Malnutrition related to chronic illness(dementia) as evidenced by percent weight loss, severe muscle depletion, severe fat depletion.    GOAL:   Patient will meet greater than or equal to 90% of their needs    MONITOR:   PO intake, Supplement acceptance, Weight trends, Labs  REASON FOR ASSESSMENT:   Consult Assessment of nutrition requirement/status  ASSESSMENT:   Pt was admitted with AKI on CKD stage IV complicated by hyperkalemia and syncope. PMH includes CKD stage IV, PVD, HTN, anemia, hx of SAH, and dementia.  Pt reports a poor appetite for 1-2 weeks prior to admission. Prior to then, she reports eating 2 meals per day and snacks throughout the day. For breakfast, she typically has pancakes and eggs. For dinner, she will have chicken, potatoes, and vegetables. Her snacks typically consist of Cheerios with whole milk, tuna salad, or egg salad sandwiches. Pt reports drinking 2 Ensures per day.   No PO intake documented.   Medications reviewed. Labs reviewed: BUN/Cr 54/2.37  Pt noted to have a 7.78% wt loss x1 month, which is significant for time frame.   NUTRITION - FOCUSED PHYSICAL EXAM:    Most Recent Value  Orbital Region  Moderate depletion  Upper Arm Region  Severe depletion  Thoracic and Lumbar Region  Moderate depletion  Buccal Region  Unable to assess [pt wearing mask]  Temple Region  Moderate depletion  Clavicle Bone Region  Severe depletion  Clavicle and Acromion Bone Region  Severe depletion  Scapular Bone Region  Severe depletion  Dorsal Hand  Severe depletion  Patellar Region  Severe depletion  Anterior Thigh Region  Severe depletion  Posterior Calf Region  Severe depletion  Edema (RD  Assessment)  None  Hair  Reviewed  Eyes  Reviewed  Mouth  Unable to assess [pt wearing mask]  Skin  Reviewed  Nails  Reviewed       Diet Order:   Diet Order            Diet regular Room service appropriate? Yes; Fluid consistency: Thin  Diet effective now              EDUCATION NEEDS:   No education needs have been identified at this time  Skin:  Skin Assessment: Reviewed RN Assessment  Last BM:  3/16  Height:   Ht Readings from Last 1 Encounters:  01/02/20 5\' 3"  (1.6 m)    Weight:   Wt Readings from Last 3 Encounters:  01/03/20 40.4 kg  12/05/19 43.9 kg  11/04/19 52 kg     BMI:  Body mass index is 15.78 kg/m.  Estimated Nutritional Needs:   Kcal:  1400-1600  Protein:  70-80 grams  Fluid:  >1.4L    Larkin Ina, MS, RD, LDN RD pager number and weekend/on-call pager number located in [place].

## 2020-01-04 DIAGNOSIS — E861 Hypovolemia: Secondary | ICD-10-CM | POA: Diagnosis present

## 2020-01-04 DIAGNOSIS — N189 Chronic kidney disease, unspecified: Secondary | ICD-10-CM | POA: Diagnosis present

## 2020-01-04 DIAGNOSIS — E440 Moderate protein-calorie malnutrition: Secondary | ICD-10-CM | POA: Diagnosis present

## 2020-01-04 DIAGNOSIS — E43 Unspecified severe protein-calorie malnutrition: Secondary | ICD-10-CM | POA: Diagnosis present

## 2020-01-04 DIAGNOSIS — D509 Iron deficiency anemia, unspecified: Secondary | ICD-10-CM | POA: Diagnosis present

## 2020-01-04 DIAGNOSIS — K5900 Constipation, unspecified: Secondary | ICD-10-CM | POA: Diagnosis present

## 2020-01-04 DIAGNOSIS — E872 Acidosis: Secondary | ICD-10-CM | POA: Diagnosis present

## 2020-01-04 DIAGNOSIS — R109 Unspecified abdominal pain: Secondary | ICD-10-CM

## 2020-01-04 DIAGNOSIS — R0602 Shortness of breath: Secondary | ICD-10-CM | POA: Diagnosis present

## 2020-01-04 DIAGNOSIS — Z8349 Family history of other endocrine, nutritional and metabolic diseases: Secondary | ICD-10-CM | POA: Diagnosis not present

## 2020-01-04 DIAGNOSIS — N170 Acute kidney failure with tubular necrosis: Secondary | ICD-10-CM

## 2020-01-04 DIAGNOSIS — Z20822 Contact with and (suspected) exposure to covid-19: Secondary | ICD-10-CM | POA: Diagnosis present

## 2020-01-04 DIAGNOSIS — F419 Anxiety disorder, unspecified: Secondary | ICD-10-CM | POA: Diagnosis present

## 2020-01-04 DIAGNOSIS — F418 Other specified anxiety disorders: Secondary | ICD-10-CM | POA: Diagnosis present

## 2020-01-04 DIAGNOSIS — Z7189 Other specified counseling: Secondary | ICD-10-CM | POA: Diagnosis not present

## 2020-01-04 DIAGNOSIS — N179 Acute kidney failure, unspecified: Secondary | ICD-10-CM | POA: Diagnosis present

## 2020-01-04 DIAGNOSIS — R079 Chest pain, unspecified: Secondary | ICD-10-CM | POA: Diagnosis not present

## 2020-01-04 DIAGNOSIS — Z981 Arthrodesis status: Secondary | ICD-10-CM | POA: Diagnosis not present

## 2020-01-04 DIAGNOSIS — F17210 Nicotine dependence, cigarettes, uncomplicated: Secondary | ICD-10-CM | POA: Diagnosis present

## 2020-01-04 DIAGNOSIS — E785 Hyperlipidemia, unspecified: Secondary | ICD-10-CM | POA: Diagnosis present

## 2020-01-04 DIAGNOSIS — Z515 Encounter for palliative care: Secondary | ICD-10-CM | POA: Diagnosis present

## 2020-01-04 DIAGNOSIS — Z681 Body mass index (BMI) 19 or less, adult: Secondary | ICD-10-CM | POA: Diagnosis not present

## 2020-01-04 DIAGNOSIS — R269 Unspecified abnormalities of gait and mobility: Secondary | ICD-10-CM | POA: Diagnosis present

## 2020-01-04 DIAGNOSIS — F0150 Vascular dementia without behavioral disturbance: Secondary | ICD-10-CM

## 2020-01-04 DIAGNOSIS — Z66 Do not resuscitate: Secondary | ICD-10-CM | POA: Diagnosis present

## 2020-01-04 DIAGNOSIS — E875 Hyperkalemia: Secondary | ICD-10-CM | POA: Diagnosis present

## 2020-01-04 DIAGNOSIS — Z96641 Presence of right artificial hip joint: Secondary | ICD-10-CM | POA: Diagnosis present

## 2020-01-04 DIAGNOSIS — N184 Chronic kidney disease, stage 4 (severe): Secondary | ICD-10-CM | POA: Diagnosis present

## 2020-01-04 DIAGNOSIS — I129 Hypertensive chronic kidney disease with stage 1 through stage 4 chronic kidney disease, or unspecified chronic kidney disease: Secondary | ICD-10-CM | POA: Diagnosis present

## 2020-01-04 DIAGNOSIS — I739 Peripheral vascular disease, unspecified: Secondary | ICD-10-CM | POA: Diagnosis present

## 2020-01-04 DIAGNOSIS — F0390 Unspecified dementia without behavioral disturbance: Secondary | ICD-10-CM | POA: Diagnosis present

## 2020-01-04 DIAGNOSIS — R55 Syncope and collapse: Secondary | ICD-10-CM | POA: Diagnosis not present

## 2020-01-04 DIAGNOSIS — F329 Major depressive disorder, single episode, unspecified: Secondary | ICD-10-CM | POA: Diagnosis present

## 2020-01-04 DIAGNOSIS — N1832 Chronic kidney disease, stage 3b: Secondary | ICD-10-CM

## 2020-01-04 LAB — CBC WITH DIFFERENTIAL/PLATELET
Abs Immature Granulocytes: 0.01 10*3/uL (ref 0.00–0.07)
Basophils Absolute: 0.1 10*3/uL (ref 0.0–0.1)
Basophils Relative: 1 %
Eosinophils Absolute: 0.3 10*3/uL (ref 0.0–0.5)
Eosinophils Relative: 7 %
HCT: 26.8 % — ABNORMAL LOW (ref 36.0–46.0)
Hemoglobin: 9.1 g/dL — ABNORMAL LOW (ref 12.0–15.0)
Immature Granulocytes: 0 %
Lymphocytes Relative: 23 %
Lymphs Abs: 1 10*3/uL (ref 0.7–4.0)
MCH: 30.5 pg (ref 26.0–34.0)
MCHC: 34 g/dL (ref 30.0–36.0)
MCV: 89.9 fL (ref 80.0–100.0)
Monocytes Absolute: 0.4 10*3/uL (ref 0.1–1.0)
Monocytes Relative: 10 %
Neutro Abs: 2.7 10*3/uL (ref 1.7–7.7)
Neutrophils Relative %: 59 %
Platelets: 302 10*3/uL (ref 150–400)
RBC: 2.98 MIL/uL — ABNORMAL LOW (ref 3.87–5.11)
RDW: 15.4 % (ref 11.5–15.5)
WBC: 4.5 10*3/uL (ref 4.0–10.5)
nRBC: 0 % (ref 0.0–0.2)

## 2020-01-04 LAB — RETICULOCYTES
Immature Retic Fract: 19.4 % — ABNORMAL HIGH (ref 2.3–15.9)
RBC.: 2.98 MIL/uL — ABNORMAL LOW (ref 3.87–5.11)
Retic Count, Absolute: 71.5 10*3/uL (ref 19.0–186.0)
Retic Ct Pct: 2.4 % (ref 0.4–3.1)

## 2020-01-04 LAB — FERRITIN: Ferritin: 428 ng/mL — ABNORMAL HIGH (ref 11–307)

## 2020-01-04 LAB — BASIC METABOLIC PANEL
Anion gap: 10 (ref 5–15)
Anion gap: 11 (ref 5–15)
BUN: 48 mg/dL — ABNORMAL HIGH (ref 8–23)
BUN: 43 mg/dL — ABNORMAL HIGH (ref 8–23)
CO2: 12 mmol/L — ABNORMAL LOW (ref 22–32)
CO2: 15 mmol/L — ABNORMAL LOW (ref 22–32)
Calcium: 8.5 mg/dL — ABNORMAL LOW (ref 8.9–10.3)
Calcium: 8.8 mg/dL — ABNORMAL LOW (ref 8.9–10.3)
Chloride: 117 mmol/L — ABNORMAL HIGH (ref 98–111)
Chloride: 114 mmol/L — ABNORMAL HIGH (ref 98–111)
Creatinine, Ser: 2.11 mg/dL — ABNORMAL HIGH (ref 0.44–1.00)
Creatinine, Ser: 1.86 mg/dL — ABNORMAL HIGH (ref 0.44–1.00)
GFR calc Af Amer: 26 mL/min — ABNORMAL LOW (ref >60)
GFR calc Af Amer: 30 mL/min — ABNORMAL LOW (ref >60)
GFR calc non Af Amer: 22 mL/min — ABNORMAL LOW (ref >60)
GFR calc non Af Amer: 26 mL/min — ABNORMAL LOW (ref >60)
Glucose, Bld: 104 mg/dL — ABNORMAL HIGH (ref 70–99)
Glucose, Bld: 135 mg/dL — ABNORMAL HIGH (ref 70–99)
Potassium: 3.9 mmol/L (ref 3.5–5.1)
Potassium: 3.6 mmol/L (ref 3.5–5.1)
Sodium: 139 mmol/L (ref 135–145)
Sodium: 140 mmol/L (ref 135–145)

## 2020-01-04 LAB — IRON AND TIBC
Iron: 57 ug/dL (ref 28–170)
Saturation Ratios: 24 % (ref 10.4–31.8)
TIBC: 234 ug/dL — ABNORMAL LOW (ref 250–450)
UIBC: 177 ug/dL

## 2020-01-04 LAB — CK: Total CK: 485 U/L — ABNORMAL HIGH (ref 38–234)

## 2020-01-04 LAB — FOLATE: Folate: 35.3 ng/mL (ref >5.9)

## 2020-01-04 LAB — PHOSPHORUS: Phosphorus: 2.8 mg/dL (ref 2.5–4.6)

## 2020-01-04 LAB — MAGNESIUM
Magnesium: 1.8 mg/dL (ref 1.7–2.4)
Magnesium: 1.8 mg/dL (ref 1.7–2.4)

## 2020-01-04 LAB — VITAMIN B12: Vitamin B-12: 506 pg/mL (ref 180–914)

## 2020-01-04 MED ORDER — ISOSORB DINITRATE-HYDRALAZINE 20-37.5 MG PO TABS
1.0000 | ORAL_TABLET | Freq: Three times a day (TID) | ORAL | Status: DC
  Administered 2020-01-04 – 2020-01-05 (×3): 1 via ORAL
  Filled 2020-01-04 (×3): qty 1

## 2020-01-04 MED ORDER — SODIUM BICARBONATE 650 MG PO TABS
1300.0000 mg | ORAL_TABLET | Freq: Two times a day (BID) | ORAL | Status: DC
  Administered 2020-01-04 – 2020-01-08 (×9): 1300 mg via ORAL
  Filled 2020-01-04 (×9): qty 2

## 2020-01-04 MED ORDER — ISOSORB DINITRATE-HYDRALAZINE 20-37.5 MG PO TABS: 1.0000 | ORAL_TABLET | Freq: Three times a day (TID) | ORAL | Status: DC

## 2020-01-04 MED ORDER — SODIUM BICARBONATE 8.4 % IV SOLN
INTRAVENOUS | Status: DC
  Filled 2020-01-04 (×3): qty 100

## 2020-01-04 NOTE — NC FL2 (Signed)
[place] LEVEL OF CARE SCREENING TOOL     IDENTIFICATION  [name] Birthdate: 1941-07-23 Sex: female Admission Date (Current Location): 01/02/2020  [facility] and [place] Number:  Herbalist and Address:  [facility] [place], [place] [address] [place], [place] [address]      Provider Number: [id number]  Attending Physician Name and Address:  Allie Bossier, MD  Relative Name and Phone Number:  Celestia Duva, son, [place]    Current Level of Care: Hospital Recommended Level of Care: [place] Prior Approval Number:    Date Approved/Denied:   PASRR Number: [id number]  Discharge Plan: SNF    Current Diagnoses: Patient Active Problem List   Diagnosis Date Noted  . Severe protein-calorie malnutrition ([place]) 01/04/2020  . Hyperkalemia 01/03/2020  . AKI (acute kidney injury) ([place]) 01/03/2020  . Acute respiratory failure with hypoxia ([place]) 11/01/2019  . Acute lower UTI 11/01/2019  . CAP (community acquired pneumonia) 11/01/2019  . Acute cystitis without hematuria   . Encephalopathy 09/18/2019  . SAH (subarachnoid hemorrhage) ([place]) 09/18/2019  . Dementia ([place]) 09/18/2019  . Metabolic acidosis 99/83/3825  . ARF (acute renal failure) ([place]) 09/17/2019  . Acute encephalopathy 09/17/2019  . Abnormal CT scan, colon   . Diarrhea   . Diverticulitis 06/07/2019  . Depression with anxiety 06/07/2019  . DNR (do not resuscitate) 03/23/2019  . Volume overload 03/23/2019  . Edema 03/23/2019  . Acute renal failure (ARF) ([place]) 03/08/2019  . Hypokalemia 03/08/2019  . Palliative care by specialist   . Goals of care, counseling/discussion   . Altered mental status   . Encephalopathy, toxic 12/24/2018  . Aspiration pneumonia of both lower lobes due to gastric secretions ([place]) 12/24/2018  . Non-ST elevation (NSTEMI) myocardial infarction ([place])   . Chest pain   . Dyspnea on exertion   . Elevated  troponin level 12/09/2018  . Chronic migraine 03/27/2018  . Lumbar radiculopathy 03/27/2018  . Gait abnormality 03/27/2018  . CKD (chronic kidney disease) stage 4, GFR 15-29 ml/min (HCC) 12/28/2016  . Symptomatic anemia 12/28/2016  . Chronic pain syndrome 12/28/2016  . Benzodiazepine withdrawal without complication ([place]) 05/39/7673  . Chronic right shoulder pain 12/06/2016  . Fall 09/17/2016  . Fracture of humeral head, closed, right, initial encounter 09/17/2016  . Rib fractures 09/17/2016  . Multiple falls 09/17/2016  . Severe muscle deconditioning 09/17/2016  . Failure to thrive in adult 09/17/2016  . Melena 04/25/2016  . Essential hypertension 04/25/2016  . Anxiety state 04/25/2016  . Tobacco abuse 04/25/2016  . Hyperlipidemia 04/25/2016  . Syncope 04/24/2016  . Syncope and collapse 04/24/2016  . Gait difficulty 01/12/2016  . Foot drop, left 01/12/2016  . Severe recurrent major depression without psychotic features ([place]) 08/28/2015  . Spondylolisthesis at L4-L5 level 07/30/2015  . PVD (peripheral vascular disease) with claudication ([place]) 07/29/2014  . Weakness-Bilateral arm/leg 07/29/2014  . Numbness-left leg 07/29/2014  . Swelling of limb-Legs 07/29/2014  . Atherosclerosis of native arteries of the extremities with intermittent claudication 09/30/2011    Orientation RESPIRATION BLADDER Height & Weight     Self, Time, Situation, Place  Normal External catheter Weight: 94 lb 5.7 oz (42.8 kg) Height:  5\' 3"  (160 cm)  BEHAVIORAL SYMPTOMS/MOOD NEUROLOGICAL BOWEL NUTRITION STATUS      Continent Diet(See discharge summary)  AMBULATORY STATUS COMMUNICATION OF NEEDS Skin   Limited Assist Verbally Normal  Personal Care Assistance Level of Assistance  Bathing, Feeding, Dressing Bathing Assistance: Limited assistance Feeding assistance: Independent Dressing Assistance: Limited assistance     Functional Limitations Info  Sight, Hearing, Speech  Sight Info: Adequate Hearing Info: Adequate Speech Info: Adequate    SPECIAL CARE FACTORS FREQUENCY  PT (By licensed PT), OT (By licensed OT)     PT Frequency: 5x week OT Frequency: 5x week            Contractures Contractures Info: Not present    Additional Factors Info  Code Status, Allergies, Psychotropic Code Status Info: Full Allergies Info: NKA Psychotropic Info: Celexa         Current Medications (01/04/2020):  This is the current hospital active medication list Current Facility-Administered Medications  Medication Dose Route Frequency Provider Last Rate Last Admin  . acetaminophen (TYLENOL) tablet 650 mg  650 mg Oral Q6H PRN Oswald Hillock, MD   650 mg at 01/03/20 2252  . aspirin EC tablet 81 mg  81 mg Oral Daily Oswald Hillock, MD   81 mg at 01/04/20 1010  . busPIRone (BUSPAR) tablet 15 mg  15 mg Oral TID PRN Oswald Hillock, MD   15 mg at 01/04/20 1055  . citalopram (CELEXA) tablet 20 mg  20 mg Oral Daily Oswald Hillock, MD   20 mg at 01/04/20 1011  . clopidogrel (PLAVIX) tablet 75 mg  75 mg Oral Daily Oswald Hillock, MD   75 mg at 01/04/20 1010  . donepezil (ARICEPT) tablet 5 mg  5 mg Oral Daily Oswald Hillock, MD   5 mg at 01/04/20 1011  . DULoxetine (CYMBALTA) DR capsule 30 mg  30 mg Oral Daily Oswald Hillock, MD   30 mg at 01/04/20 1011  . feeding supplement (ENSURE ENLIVE) (ENSURE ENLIVE) liquid 237 mL  237 mL Oral TID BM Arrien, Jimmy Picket, MD   237 mL at 01/03/20 2000  . ferrous sulfate tablet 325 mg  325 mg Oral Q breakfast Oswald Hillock, MD   325 mg at 01/04/20 0853  . gabapentin (NEURONTIN) capsule 100 mg  100 mg Oral TID Oswald Hillock, MD   100 mg at 01/04/20 1011  . heparin injection 5,000 Units  5,000 Units Subcutaneous Q8H Oswald Hillock, MD   5,000 Units at 01/04/20 [id number]  . isosorbide-hydrALAZINE (BIDIL) 20-37.5 MG per tablet 1 tablet  1 tablet Oral TID Allie Bossier, MD      . metoprolol tartrate (LOPRESSOR) tablet 25 mg  25 mg Oral BID Tawni Millers, MD   25 mg at 01/04/20 1011  . multivitamin with minerals tablet 1 tablet  1 tablet Oral Daily Arrien, Jimmy Picket, MD   1 tablet at 01/04/20 1010  . nitroGLYCERIN (NITROSTAT) SL tablet 0.4 mg  0.4 mg Sublingual Q5 min PRN Oswald Hillock, MD   0.4 mg at 01/02/20 2340  . ondansetron (ZOFRAN) tablet 4 mg  4 mg Oral Q6H PRN Oswald Hillock, MD       Or  . ondansetron (ZOFRAN) injection 4 mg  4 mg Intravenous Q6H PRN Oswald Hillock, MD      . pantoprazole (PROTONIX) EC tablet 40 mg  40 mg Oral QAC breakfast Oswald Hillock, MD   40 mg at 01/04/20 6378  . rosuvastatin (CRESTOR) tablet 40 mg  40 mg Oral Daily Oswald Hillock, MD   40 mg at 01/04/20 1010  . sodium bicarbonate 100 mEq in dextrose  5 % 1,000 mL infusion   Intravenous Continuous Allie Bossier, MD      . sodium bicarbonate tablet 1,300 mg  1,300 mg Oral BID Madelon Lips, MD      . sodium chloride flush (NS) 0.9 % injection 3 mL  3 mL Intravenous Once [place], Marge Duncans, MD      . temazepam (RESTORIL) capsule 15 mg  15 mg Oral Daily PRN Oswald Hillock, MD         Discharge Medications: Please see discharge summary for a list of discharge medications.  Relevant Imaging Results:  Relevant Lab Results:   Additional Information SS# [id number]  Kirstie Peri, Student-Social Work

## 2020-01-04 NOTE — Social Work (Signed)
Insurance authorization started & clinicals faxed. Reference [id number].  Criss Alvine, CSW

## 2020-01-04 NOTE — Consult Note (Addendum)
[facility]  HISTORY AND PHYSICAL  Alyssa [name] is an 79 y.o. female.    Chief Complaint: SOB  HPI: Pt is a 43F with a PMH sig for HTN, HLD, solitary kidney followed by Dr. Joelyn Oms who is now seen in consultation at the request of Dr. Sherral Hammers for management of AKI on CKD and metabolic acidosis.  Pt was found on the floor by her son with SOB and CP.  CTA not performed d/t renal insuffiency.  Had AKI on CKD.  Thought to have syncope as well.  Hydrated, orthostatics obtained.  Has dementia as well--> can't tell me exactly what happened.  VQ scan low probability for PE  Cr peaked at 2.82 on 3/17, now down to 2.11.  Has sig metabolic acidosis with CO2 12, up from 8.  She notes some abd pain today.  No f/c, n/v, SOB, further CP.    Last visit with Dr Joelyn Oms October 2020 where Cr was 1.92.  Bicarb was normal, no bicarb.    PMH: Past Medical History:  Diagnosis Date  . Acute respiratory failure with hypoxia ([place]) 11/01/2019  . Anxiety   . Arthritis   . Chest pain   . Chronic kidney disease   . Dyspnea   . Elevated troponin 12/13/2018  . Headache(784.0)   . Hyperlipidemia   . Hypertension   . Joint pain   . Leg pain   . Peripheral neuropathy   . Peripheral vascular disease (HCC)    PSH: Past Surgical History:  Procedure Laterality Date  . ABDOMINAL ANGIOGRAM N/A 10/29/2011   Procedure: ABDOMINAL ANGIOGRAM;  Surgeon: Elam Dutch, MD;  Location: [facility] CATH LAB;  Service: Cardiovascular;  Laterality: N/A;  . BACK SURGERY    . FEMORAL-FEMORAL BYPASS GRAFT  08/31/2010  . FLEXIBLE SIGMOIDOSCOPY N/A 06/12/2019   Procedure: FLEXIBLE SIGMOIDOSCOPY;  Surgeon: Ladene Artist, MD;  Location: [facility] ENDOSCOPY;  Service: Endoscopy;  Laterality: N/A;  Patient had little bit to eat this morning so this will be unsedated.  Marland Kitchen JOINT REPLACEMENT Right 2012   Hip  . LOWER EXTREMITY ANGIOGRAPHY  06/14/2017   Procedure: Lower Extremity Angiography;  Surgeon: Adrian Prows, MD;  Location:  [place] CV LAB;  Service: Cardiovascular;;  Bilateral limited angio performed  . RENAL ANGIOGRAPHY N/A 06/14/2017   Procedure: Renal Angiography;  Surgeon: Adrian Prows, MD;  Location: [facility] CV LAB;  Service: Cardiovascular;  Laterality: N/A;  . TOTAL HIP ARTHROPLASTY     right    Past Medical History:  Diagnosis Date  . Acute respiratory failure with hypoxia ([place]) 11/01/2019  . Anxiety   . Arthritis   . Chest pain   . Chronic kidney disease   . Dyspnea   . Elevated troponin 12/13/2018  . Headache(784.0)   . Hyperlipidemia   . Hypertension   . Joint pain   . Leg pain   . Peripheral neuropathy   . Peripheral vascular disease (HCC)     Medications:   Scheduled: . aspirin EC  81 mg Oral Daily  . citalopram  20 mg Oral Daily  . clopidogrel  75 mg Oral Daily  . donepezil  5 mg Oral Daily  . DULoxetine  30 mg Oral Daily  . feeding supplement (ENSURE ENLIVE)  237 mL Oral TID BM  . ferrous sulfate  325 mg Oral Q breakfast  . gabapentin  100 mg Oral TID  . heparin  5,000 Units Subcutaneous Q8H  . isosorbide-hydrALAZINE  1 tablet Oral TID  .  metoprolol tartrate  25 mg Oral BID  . multivitamin with minerals  1 tablet Oral Daily  . pantoprazole  40 mg Oral QAC breakfast  . rosuvastatin  40 mg Oral Daily  . sodium bicarbonate  1,300 mg Oral BID  . sodium chloride flush  3 mL Intravenous Once    Medications Prior to Admission  Medication Sig Dispense Refill  . acetaminophen (TYLENOL) 325 MG tablet Take 2 tablets (650 mg total) by mouth every 6 (six) hours as needed for mild pain, moderate pain or fever (or Fever >/= 101).    Marland Kitchen albuterol (PROAIR HFA) 108 (90 Base) MCG/ACT inhaler Inhale 2 puffs into the lungs every 6 (six) hours as needed for wheezing or shortness of breath.    Marland Kitchen aspirin 81 MG EC tablet Take 1 tablet (81 mg total) by mouth daily. 30 tablet 1  . aspirin-acetaminophen-caffeine (EXCEDRIN MIGRAINE) [id number] MG tablet Take 2 tablets by mouth every 6 (six)  hours as needed for headache or migraine.    . busPIRone (BUSPAR) 15 MG tablet Take 15 mg by mouth 3 (three) times daily as needed for anxiety.    . Cholecalciferol (VITAMIN D-3 PO) Take 1 capsule by mouth daily with breakfast.    . citalopram (CELEXA) 20 MG tablet Take 1 tablet (20 mg total) by mouth daily. 30 tablet 1  . clopidogrel (PLAVIX) 75 MG tablet Take 1 tablet (75 mg total) by mouth daily. 30 tablet 1  . donepezil (ARICEPT) 5 MG tablet Take 5 mg by mouth daily.    . DULoxetine (CYMBALTA) 30 MG capsule Take 30 mg by mouth daily.    . feeding supplement, ENSURE ENLIVE, (ENSURE ENLIVE) LIQD Take 237 mLs by mouth 2 (two) times daily between meals. 237 mL 12  . Ferrous Sulfate (IRON HIGH-POTENCY) 325 MG TABS Take 325 mg by mouth daily with breakfast.    . gabapentin (NEURONTIN) 100 MG capsule Take 100 mg by mouth 3 (three) times daily.    . isosorbide-hydrALAZINE (BIDIL) 20-37.5 MG tablet Take 2 tablets by mouth 3 (three) times daily.    . megestrol (MEGACE) 40 MG tablet Take 40 mg by mouth 2 (two) times daily.    . metoprolol tartrate (LOPRESSOR) 25 MG tablet Take 2 tablets (50 mg total) by mouth 2 (two) times daily. (Patient taking differently: Take 25 mg by mouth 2 (two) times daily. ) 120 tablet 0  . Multiple Vitamin (MULTIVITAMIN WITH MINERALS) TABS tablet Take 1 tablet by mouth daily.    . pantoprazole (PROTONIX) 40 MG tablet Take 1 tablet (40 mg total) by mouth daily. (Patient taking differently: Take 40 mg by mouth daily before breakfast. ) 30 tablet 0  . rosuvastatin (CRESTOR) 40 MG tablet Take 1 tablet (40 mg total) by mouth at bedtime. (Patient taking differently: Take 40 mg by mouth daily. )    . sodium bicarbonate 650 MG tablet Take 2 tablets (1,300 mg total) by mouth 2 (two) times daily with a meal. (Patient taking differently: Take 650 mg by mouth 2 (two) times daily with a meal. ) 120 tablet 0  . temazepam (RESTORIL) 15 MG capsule Take 15 mg by mouth daily as needed (for  anxiety).     . magnesium oxide (MAG-OX) 400 (241.3 Mg) MG tablet Take 1 tablet (400 mg total) by mouth 2 (two) times daily. (Patient not taking: Reported on 01/02/2020) 30 tablet 0  . valproic acid (DEPAKENE) 250 MG capsule Take 1 capsule every night (Patient not taking: Reported on  01/02/2020) 30 capsule 11    ALLERGIES:  No Known Allergies  FAM HX: Family History  Problem Relation Age of Onset  . Heart attack Mother   . Heart disease Mother   . Hyperlipidemia Mother   . Hypertension Mother   . Heart attack Father   . Heart disease Father        Before age 21  . Hyperlipidemia Father   . Hypertension Father   . Cancer Sister        brain tumor  . Aneurysm Brother        brain  . Colon cancer Neg Hx   . Liver cancer Neg Hx     Social History:   reports that she has been smoking cigarettes. She has a 27.00 pack-year smoking history. She has never used smokeless tobacco. She reports that she does not drink alcohol or use drugs.  ROS: ROS: all other systems reviewed and are negative except as per HPI  Blood pressure (!) 170/96, pulse 80, temperature 98.3 F (36.8 C), temperature source Oral, resp. rate 15, height 5\' 3"  (1.6 m), weight 42.8 kg, SpO2 100 %. PHYSICAL EXAM: Physical Exam  GEN nad, sitting in bed HEENT EOMI PERRL NECK no JVD PULM clear bilaterally CV RRR ABD soft, mildly tender LLQ EXT no LE edema NEURO able to have a conversation SKIN no rashes MSK no effusions   Results for orders placed or performed during the hospital encounter of 01/02/20 (from the past 48 hour(s))  CBC     Status: Abnormal   Collection Time: 01/02/20 11:28 PM  Result Value Ref Range   WBC 9.3 4.0 - 10.5 K/uL   RBC 3.54 (L) 3.87 - 5.11 MIL/uL   Hemoglobin 10.7 (L) 12.0 - 15.0 g/dL   HCT 34.6 (L) 36.0 - 46.0 %   MCV 97.7 80.0 - 100.0 fL   MCH 30.2 26.0 - 34.0 pg   MCHC 30.9 30.0 - 36.0 g/dL   RDW 15.3 11.5 - 15.5 %   Platelets 309 150 - 400 K/uL   nRBC 0.0 0.0 - 0.2 %     Comment: Performed at [facility], [place] [address] [place], [place] [address]  Creatinine, serum     Status: Abnormal   Collection Time: 01/02/20 11:28 PM  Result Value Ref Range   Creatinine, Ser 2.45 (H) 0.44 - 1.00 mg/dL   GFR calc non Af Amer 19 (L) >60 mL/min   GFR calc Af Amer 21 (L) >60 mL/min    Comment: Performed at [facility] [address], [place],  [address]  Troponin I (High Sensitivity)     Status: Abnormal   Collection Time: 01/02/20 11:28 PM  Result Value Ref Range   Troponin I (High Sensitivity) 33 (H) <18 ng/L    Comment: (NOTE) Elevated high sensitivity troponin I (hsTnI) values and significant  changes across serial measurements may suggest ACS but many other  chronic and acute conditions are known to elevate hsTnI results.  Refer to the "Links" section for chest pain algorithms and additional  guidance. Performed at [facility], [place] [address], [place] [address]   CBC     Status: Abnormal   Collection Time: 01/03/20  2:07 AM  Result Value Ref Range   WBC 8.4 4.0 - 10.5 K/uL   RBC 3.42 (L) 3.87 - 5.11 MIL/uL   Hemoglobin 10.3 (L) 12.0 - 15.0 g/dL   HCT 31.7 (L) 36.0 - 46.0 %  MCV 92.7 80.0 - 100.0 fL   MCH 30.1 26.0 - 34.0 pg   MCHC 32.5 30.0 - 36.0 g/dL   RDW 15.1 11.5 - 15.5 %   Platelets 288 150 - 400 K/uL   nRBC 0.0 0.0 - 0.2 %    Comment: Performed at [facility], [place] [address] [place], [place] [address]  Comprehensive metabolic panel     Status: Abnormal   Collection Time: 01/03/20  6:02 AM  Result Value Ref Range   Sodium 142 135 - 145 mmol/L   Potassium 4.7 3.5 - 5.1 mmol/L   Chloride 121 (H) 98 - 111 mmol/L   CO2 8 (L) 22 - 32 mmol/L   Glucose, Bld 108 (H) 70 - 99 mg/dL    Comment: Glucose reference range applies only to samples taken after fasting for at least 8 hours.   BUN 54 (H) 8 - 23 mg/dL   Creatinine, Ser 2.37 (H) 0.44 - 1.00 mg/dL   Calcium 9.3 8.9 - 10.3 mg/dL   Total Protein 6.3  (L) 6.5 - 8.1 g/dL   Albumin 3.7 3.5 - 5.0 g/dL   AST 22 15 - 41 U/L   ALT 14 0 - 44 U/L   Alkaline Phosphatase 61 38 - 126 U/L   Total Bilirubin 0.7 0.3 - 1.2 mg/dL   GFR calc non Af Amer 19 (L) >60 mL/min   GFR calc Af Amer 22 (L) >60 mL/min   Anion gap 13 5 - 15    Comment: Performed at [facility] [address], [place], [place] [address]  Troponin I (High Sensitivity)     Status: Abnormal   Collection Time: 01/03/20  6:02 AM  Result Value Ref Range   Troponin I (High Sensitivity) 26 (H) <18 ng/L    Comment: (NOTE) Elevated high sensitivity troponin I (hsTnI) values and significant  changes across serial measurements may suggest ACS but many other  chronic and acute conditions are known to elevate hsTnI results.  Refer to the Links section for chest pain algorithms and additional  guidance. Performed at [facility], [place] [address] [place], [place] [address]   Basic metabolic panel     Status: Abnormal   Collection Time: 01/04/20  2:33 AM  Result Value Ref Range   Sodium 139 135 - 145 mmol/L   Potassium 3.9 3.5 - 5.1 mmol/L   Chloride 117 (H) 98 - 111 mmol/L   CO2 12 (L) 22 - 32 mmol/L   Glucose, Bld 104 (H) 70 - 99 mg/dL    Comment: Glucose reference range applies only to samples taken after fasting for at least 8 hours.   BUN 48 (H) 8 - 23 mg/dL   Creatinine, Ser 2.11 (H) 0.44 - 1.00 mg/dL   Calcium 8.5 (L) 8.9 - 10.3 mg/dL   GFR calc non Af Amer 22 (L) >60 mL/min   GFR calc Af Amer 26 (L) >60 mL/min   Anion gap 10 5 - 15    Comment: Performed at [facility] [address], [place], [place] [address]  CK     Status: Abnormal   Collection Time: 01/04/20  2:33 AM  Result Value Ref Range   Total CK 485 (H) 38 - 234 U/L    Comment: Performed at [facility], [place] [address], [place], [place] [address]  Vitamin B12     Status: None   Collection Time: 01/04/20 10:02 AM  Result Value  Ref Range   Vitamin B-12 506 180 - 914 pg/mL    Comment:  (NOTE) This assay is not validated for testing neonatal or myeloproliferative syndrome specimens for Vitamin B12 levels. Performed at [facility], [place] [address], [place], [place] [address]   Folate     Status: None   Collection Time: 01/04/20 10:02 AM  Result Value Ref Range   Folate 35.3 >5.9 ng/mL    Comment: Performed at [facility] [address], [place], [place] [address]  Iron and TIBC     Status: Abnormal   Collection Time: 01/04/20 10:02 AM  Result Value Ref Range   Iron 57 28 - 170 ug/dL   TIBC 234 (L) 250 - 450 ug/dL   Saturation Ratios 24 10.4 - 31.8 %   UIBC 177 ug/dL    Comment: Performed at [facility], [place] [address], [place], [place] [address]  Ferritin     Status: Abnormal   Collection Time: 01/04/20 10:02 AM  Result Value Ref Range   Ferritin 428 (H) 11 - 307 ng/mL    Comment: Performed at [facility], [place] [address], [place], [place] [address]  Reticulocytes     Status: Abnormal   Collection Time: 01/04/20 10:02 AM  Result Value Ref Range   Retic Ct Pct 2.4 0.4 - 3.1 %   RBC. 2.98 (L) 3.87 - 5.11 MIL/uL   Retic Count, Absolute 71.5 19.0 - 186.0 K/uL   Immature Retic Fract 19.4 (H) 2.3 - 15.9 %    Comment: Performed at [facility] [address] [place], [place] [address]  CBC with Differential/Platelet     Status: Abnormal   Collection Time: 01/04/20 10:02 AM  Result Value Ref Range   WBC 4.5 4.0 - 10.5 K/uL   RBC 2.98 (L) 3.87 - 5.11 MIL/uL   Hemoglobin 9.1 (L) 12.0 - 15.0 g/dL   HCT 26.8 (L) 36.0 - 46.0 %   MCV 89.9 80.0 - 100.0 fL   MCH 30.5 26.0 - 34.0 pg   MCHC 34.0 30.0 - 36.0 g/dL   RDW 15.4 11.5 - 15.5 %   Platelets 302 150 - 400 K/uL   nRBC 0.0 0.0 - 0.2 %   Neutrophils Relative % 59 %   Neutro Abs 2.7 1.7 - 7.7 K/uL   Lymphocytes Relative 23 %   Lymphs Abs 1.0 0.7 - 4.0 K/uL   Monocytes Relative 10 %   Monocytes Absolute 0.4 0.1 - 1.0 K/uL   Eosinophils Relative 7 %   Eosinophils Absolute 0.3 0.0  - 0.5 K/uL   Basophils Relative 1 %   Basophils Absolute 0.1 0.0 - 0.1 K/uL   Immature Granulocytes 0 %   Abs Immature Granulocytes 0.01 0.00 - 0.07 K/uL    Comment: Performed at [facility], [address], [place], [place] [address]  Magnesium     Status: None   Collection Time: 01/04/20 10:02 AM  Result Value Ref Range   Magnesium 1.8 1.7 - 2.4 mg/dL    Comment: Performed at [facility] [address] [place], [place] [address]  Phosphorus     Status: None   Collection Time: 01/04/20 10:02 AM  Result Value Ref Range   Phosphorus 2.8 2.5 - 4.6 mg/dL    Comment: Performed at [facility] [address] [place], [place] [address]    NM Pulmonary Perfusion  Result Date: 01/02/2020 CLINICAL DATA:  79 year old female with no pelvis in lung scan. EXAM: NUCLEAR MEDICINE PERFUSION LUNG SCAN TECHNIQUE: Perfusion images were obtained in multiple projections after intravenous injection of radiopharmaceutical. Ventilation scans intentionally deferred if perfusion scan and chest x-ray adequate for interpretation during COVID 19 epidemic. RADIOPHARMACEUTICALS:  1.7 mCi Tc-6m MAA IV COMPARISON:  Chest radiograph dated 01/02/2020 and CT dated 01/02/2020 FINDINGS: There is homogeneous and symmetric perfusion of the lungs. No perfusion defect identified. IMPRESSION: Normal perfusion lung scan. Electronically Signed   By: Anner Crete M.D.   On: 01/02/2020 20:12   ECHOCARDIOGRAM COMPLETE  Result Date: 01/03/2020    ECHOCARDIOGRAM REPORT   Patient Name:   Alyssa [name] Date of Exam: 01/03/2020 Medical Rec #:  [id number]            Height:       63.0 in Accession #:    [id number]           Weight:       89.1 lb Date of Birth:  02-17-1941           BSA:          1.371 m Patient Age:    51 years             BP:           117/75 mmHg Patient Gender: F                    HR:           74 bpm. Exam Location:  Inpatient Procedure: 2D Echo Indications:    780.2 syncope  History:         Patient has prior history of Echocardiogram examinations, most                 recent 12/11/2018. Risk Factors:Hypertension, Dyslipidemia and                 Current Smoker.  Sonographer:    Jannett Celestine RDCS (AE) Referring Phys: Earnie Larsson LAMA  Sonographer Comments: Technically difficult study due to poor echo windows. IMPRESSIONS  1. There is severe concentric LVH normal LVEF, 60-65%. Findings could represent hypertensive heart disease versus infiltrative disorder such as cardiac amyoloidosis. Would consider a PYP scan to exclude ATTR cardiac amyloid.  2. Left ventricular ejection fraction, by estimation, is 60 to 65%. The left ventricle has normal function. The left ventricle has no regional wall motion abnormalities. There is severe concentric left ventricular hypertrophy. Left ventricular diastolic  parameters are consistent with Grade II diastolic dysfunction (pseudonormalization). Elevated left atrial pressure.  3. Right ventricular systolic function is normal. The right ventricular size is normal. Tricuspid regurgitation signal is inadequate for assessing PA pressure. The estimated right ventricular systolic pressure is 22.9 mmHg.  4. Left atrial size was mildly dilated.  5. The mitral valve is degenerative. No evidence of mitral valve regurgitation. No evidence of mitral stenosis.  6. The aortic valve is grossly normal. Aortic valve regurgitation is not visualized. No aortic stenosis is present.  7. The inferior vena cava is normal in size with <50% respiratory variability, suggesting right atrial pressure of 8 mmHg. FINDINGS  Left Ventricle: Left ventricular ejection fraction, by estimation, is 60 to 65%. The left ventricle has normal function. The left ventricle has no regional wall motion abnormalities. The left ventricular internal cavity size was normal in size. There is  severe concentric left ventricular hypertrophy. Left ventricular diastolic parameters are consistent with Grade II  diastolic  dysfunction (pseudonormalization). Elevated left atrial pressure. Right Ventricle: The right ventricular size is normal. No increase in right ventricular wall thickness. Right ventricular systolic function is normal. Tricuspid regurgitation signal is inadequate for assessing PA pressure. The tricuspid regurgitant velocity is 1.85 m/s, and with an assumed right atrial pressure of 8 mmHg, the estimated right ventricular systolic pressure is 58.0 mmHg. Left Atrium: Left atrial size was mildly dilated. Right Atrium: Right atrial size was normal in size. Pericardium: Trivial pericardial effusion is present. Mitral Valve: The mitral valve is degenerative in appearance. There is mild thickening of the mitral valve leaflet(s). Mild mitral annular calcification. No evidence of mitral valve regurgitation. No evidence of mitral valve stenosis. Tricuspid Valve: The tricuspid valve is grossly normal. Tricuspid valve regurgitation is trivial. No evidence of tricuspid stenosis. Aortic Valve: The aortic valve is grossly normal. Aortic valve regurgitation is not visualized. No aortic stenosis is present. Pulmonic Valve: The pulmonic valve was grossly normal. Pulmonic valve regurgitation is not visualized. No evidence of pulmonic stenosis. Aorta: The aortic root is normal in size and structure. Venous: The inferior vena cava is normal in size with less than 50% respiratory variability, suggesting right atrial pressure of 8 mmHg. IAS/Shunts: No atrial level shunt detected by color flow Doppler. Additional Comments: There is severe concentric LVH normal LVEF, 60-65%. Findings could represent hypertensive heart disease versus infiltrative disorder such as cardiac amyoloidosis. Would consider a PYP scan to exclude ATTR cardiac amyloid.  LEFT VENTRICLE PLAX 2D LVIDd:         3.50 cm  Diastology LVIDs:         2.40 cm  LV e' lateral:   5.11 cm/s LV PW:         1.20 cm  LV E/e' lateral: 15.2 LV IVS:        1.40 cm  LV e' medial:    4.24  cm/s LVOT diam:     2.00 cm  LV E/e' medial:  18.3 LV SV:         73 LV SV Index:   53 LVOT Area:     3.14 cm  RIGHT VENTRICLE RV S prime:     10.20 cm/s TAPSE (M-mode): 1.6 cm LEFT ATRIUM             Index       RIGHT ATRIUM           Index LA diam:        3.30 cm 2.41 cm/m  RA Area:     10.10 cm LA Vol (A2C):   25.5 ml 18.60 ml/m RA Volume:   19.10 ml  13.93 ml/m LA Vol (A4C):   23.4 ml 17.07 ml/m LA Biplane Vol: 24.9 ml 18.16 ml/m  AORTIC VALVE LVOT Vmax:   94.60 cm/s LVOT Vmean:  70.200 cm/s LVOT VTI:    0.231 m  AORTA Ao Root diam: 3.20 cm MITRAL VALVE               TRICUSPID VALVE MV Area (PHT): 2.37 cm    TR Peak grad:   13.6 mmHg MV Decel Time: 320 msec    TR Vmax:        184.60 cm/s MV E velocity: 77.60 cm/s MV A velocity: 97.30 cm/s  SHUNTS MV E/A ratio:  0.80        Systemic VTI:  0.23 m  Systemic Diam: 2.00 cm Eleonore Chiquito MD Electronically signed by Eleonore Chiquito MD Signature Date/Time: 01/03/2020/11:21:07 AM    Final     Assessment/Plan  1.  AKI on CKD: appears to be hemodynamically mediated and better with hydration.  Agree with continuing to check orthostatics and encouraging PO intake.  HCTZ on OP medlist- would not restart for now.  Her baseline Cr is 1.9 and she's nearing it.  Will make sure she has followup with Dr Joelyn Oms, will request appt.  Solitary kidney  2.  Metabolic acidosis-- have added sodium bicarb 1300 BID, bicarb gtt renewed as well- (Agree).  3.  Syncope- EKG and trops OK, VQ low probability, possibly orthostatic  4.  Dispo: looks like SNF  Glinda Natzke 01/04/2020, 5:06 PM

## 2020-01-04 NOTE — Progress Notes (Signed)
01/04/20 1303  PT Visit Information  Last PT Received On 01/04/20  Assistance Needed +2 (for ambulation )  History of Present Illness Pt is a 79 y/o female admitted secondary to syncope and chest pressure. Found to have AKI. PMH includes PVD, HTN, CKD, dementia, and R THA.   Subjective Data  Patient Stated Goal to get better and get home  Precautions  Precautions Fall  Required Braces or Orthoses Other Brace  Other Brace typically has specialized shoes for leg discrepancy  Restrictions  Weight Bearing Restrictions No  Pain Assessment  Pain Assessment Faces  Faces Pain Scale 6  Pain Location chest  Pain Descriptors / Indicators Discomfort;Constant  Pain Intervention(s) Monitored during session;Limited activity within patient's tolerance;Repositioned  Cognition  Arousal/Alertness Awake/alert  Behavior During Therapy WFL for tasks assessed/performed  Overall Cognitive Status History of cognitive impairments - at baseline  Bed Mobility  Overal bed mobility Needs Assistance  Bed Mobility Supine to Sit;Sit to Supine  Supine to sit Min guard  Sit to supine Min guard  General bed mobility comments min guard with use of rail  Transfers  Overall transfer level Needs assistance  Equipment used Rolling walker (2 wheeled)  Transfers Sit to/from Stand  Sit to Stand Min assist  General transfer comment min A for lift assist and steadying.   Ambulation/Gait  Ambulation/Gait assistance Mod assist;Min assist  Gait Distance (Feet) 2 Feet  Assistive device Rolling walker (2 wheeled)  Gait Pattern/deviations Step-through pattern;Decreased stride length  General Gait Details Leg length discrepancy at [place] and pt walking on toes on R. Pt only able to tolerate a couple steps before reporting she felt bad, so returned to bed. Pt very shaky, requiring min to mod A.   Gait velocity Decreased  Balance  Overall balance assessment Needs assistance  Sitting-balance support No upper extremity  supported;Feet supported  Sitting balance-Leahy Scale Fair  Standing balance support Bilateral upper extremity supported;During functional activity  Standing balance-Leahy Scale Poor  Standing balance comment Reliant on UE and external support   PT - End of Session  Equipment Utilized During Treatment Gait belt  Activity Tolerance Patient limited by fatigue  Patient left in bed;with call bell/phone within reach;with bed alarm set  Nurse Communication Mobility status   PT - Assessment/Plan  PT Plan Frequency needs to be updated;Equipment recommendations need to be updated  PT Visit Diagnosis Unsteadiness on feet (R26.81);Muscle weakness (generalized) (M62.81)  PT Frequency (ACUTE ONLY) Min 2X/week  Follow Up Recommendations SNF;Supervision/Assistance - 24 hour  PT equipment Rolling walker with 5" wheels  AM-PAC PT "6 Clicks" Mobility Outcome Measure (Version 2)  Help needed turning from your back to your side while in a flat bed without using bedrails? 3  Help needed moving from lying on your back to sitting on the side of a flat bed without using bedrails? 3  Help needed moving to and from a bed to a chair (including a wheelchair)? 2  Help needed standing up from a chair using your arms (e.g., wheelchair or bedside chair)? 3  Help needed to walk in hospital room? 2  Help needed climbing 3-5 steps with a railing?  1  6 Click Score 14  Consider Recommendation of Discharge To: CIR/SNF/LTACH  PT Goal Progression  Progress towards PT goals Progressing toward goals  Acute Rehab PT Goals  PT Goal Formulation With patient  Time For Goal Achievement 01/17/20  Potential to Achieve Goals Good  PT Time Calculation  PT Start Time (ACUTE ONLY)  1152  PT Stop Time (ACUTE ONLY) 1213  PT Time Calculation (min) (ACUTE ONLY) 21 min  PT General Charges  $$ ACUTE PT VISIT 1 Visit  PT Treatments  $Therapeutic Activity 8-22 mins   Pt progressing towards goals, however, continues to be limited by  fatigue and reports of "not feeling well". Pt unsteady and only able to tolerate a few steps from EOB. Required min to mod A for mobility. MD had discussion about SNF with pt while PT in room. Pt agreeable to SNF recommendations. Feel this is most beneficial at this time given current deficits. Will continue to follow acutely to maximize functional mobility independence and safety.   Reuel Derby, PT, DPT  Acute Rehabilitation Services  Pager: [phone] Office: [phone]

## 2020-01-04 NOTE — TOC Initial Note (Signed)
Transition of Care [facility]) - Initial/Assessment Note    Patient Details  Name: Alyssa [name] MRN: [id number] Date of Birth: [birth year]  [facility] of [facility]) CM/SW Contact:    Carles Collet, RN Phone Number: 01/04/2020, 12:22 PM  Clinical Narrative:     Damaris Schooner w patient at bedside. She states that she would like to go to SNF prior to going home. I have notified CSW.                Expected Discharge Plan: Skilled Nursing Facility Barriers to Discharge: Continued Medical Work up   Patient Goals and CMS Choice Patient states their goals for this hospitalization and ongoing recovery are:: to go to rehab before she goes home      Expected Discharge Plan and Services Expected Discharge Plan: [place]                                              Prior Living Arrangements/Services                       Activities of Daily Living Home Assistive Devices/Equipment: Dentures (specify type), Walker (specify type), Wheelchair ADL Screening (condition at time of admission) Patient's cognitive ability adequate to safely complete daily activities?: Yes Is the patient deaf or have difficulty hearing?: No Does the patient have difficulty seeing, even when wearing glasses/contacts?: No Does the patient have difficulty concentrating, remembering, or making decisions?: No Patient able to express need for assistance with ADLs?: Yes Does the patient have difficulty dressing or bathing?: No Independently performs ADLs?: Yes (appropriate for developmental age) Does the patient have difficulty walking or climbing stairs?: Yes Weakness of Legs: Both Weakness of Arms/Hands: Right  Permission Sought/Granted                  Emotional Assessment              Admission diagnosis:  Shortness of breath [R06.02] Syncope [R55] AKI (acute kidney injury) ([place]) [N17.9] Elevated d-dimer [R79.89] Abdominal pain, unspecified abdominal location  [R10.9] Syncope, unspecified syncope type [R55] Chest pain, unspecified type [R07.9] Patient Active Problem List   Diagnosis Date Noted  . Hyperkalemia 01/03/2020  . AKI (acute kidney injury) ([place]) 01/03/2020  . Acute respiratory failure with hypoxia ([place]) 11/01/2019  . Acute lower UTI 11/01/2019  . CAP (community acquired pneumonia) 11/01/2019  . Acute cystitis without hematuria   . Encephalopathy 09/18/2019  . SAH (subarachnoid hemorrhage) ([place]) 09/18/2019  . Dementia ([place]) 09/18/2019  . Metabolic acidosis 93/57/0177  . ARF (acute renal failure) ([place]) 09/17/2019  . Acute encephalopathy 09/17/2019  . Abnormal CT scan, colon   . Diarrhea   . Diverticulitis 06/07/2019  . Depression with anxiety 06/07/2019  . DNR (do not resuscitate) 03/23/2019  . Volume overload 03/23/2019  . Edema 03/23/2019  . Acute renal failure (ARF) ([place]) 03/08/2019  . Hypokalemia 03/08/2019  . Palliative care by specialist   . Goals of care, counseling/discussion   . Altered mental status   . Encephalopathy, toxic 12/24/2018  . Aspiration pneumonia of both lower lobes due to gastric secretions ([employer]) 12/24/2018  . Non-ST elevation (NSTEMI) myocardial infarction ([place])   . Chest pain   . Dyspnea on exertion   . Elevated troponin level 12/09/2018  . Chronic migraine 03/27/2018  . Lumbar radiculopathy 03/27/2018  . Gait abnormality  03/27/2018  . CKD (chronic kidney disease) stage 4, GFR 15-29 ml/min (HCC) 12/28/2016  . Symptomatic anemia 12/28/2016  . Chronic pain syndrome 12/28/2016  . Benzodiazepine withdrawal without complication ([place]) 79/43/2761  . Chronic right shoulder pain 12/06/2016  . Fall 09/17/2016  . Fracture of humeral head, closed, right, initial encounter 09/17/2016  . Rib fractures 09/17/2016  . Multiple falls 09/17/2016  . Severe muscle deconditioning 09/17/2016  . Failure to thrive in adult 09/17/2016  . Melena 04/25/2016  . Essential hypertension 04/25/2016  . Anxiety state  04/25/2016  . Tobacco abuse 04/25/2016  . Hyperlipidemia 04/25/2016  . Syncope 04/24/2016  . Syncope and collapse 04/24/2016  . Gait difficulty 01/12/2016  . Foot drop, left 01/12/2016  . Severe recurrent major depression without psychotic features ([place]) 08/28/2015  . Spondylolisthesis at L4-L5 level 07/30/2015  . PVD (peripheral vascular disease) with claudication ([place]) 07/29/2014  . Weakness-Bilateral arm/leg 07/29/2014  . Numbness-left leg 07/29/2014  . Swelling of limb-Legs 07/29/2014  . Atherosclerosis of native arteries of the extremities with intermittent claudication 09/30/2011   PCP:  Loretha Brasil, FNP Pharmacy:   [facility] [place], [place] [address] Phone: [phone] Fax: [place], [place] [address] [place] [address] Phone: [phone] Fax: [phone]     Social Determinants of Health ([place]) Interventions    Readmission Risk Interventions Readmission Risk Prevention Plan 12/26/2018  Transportation Screening Complete  Medication Review (RN Care Manager) Complete  PCP or Specialist appointment within 3-5 days of discharge Not Complete  PCP/Specialist Appt Not Complete comments pt for snf. Snf MD to see as needed.  [place] or Home Care Consult Not Complete  HRI or Home Care Consult Pt Refusal Comments NA  SW Recovery Care/Counseling Consult Not Complete  SW Consult Not Complete Comments NA  Palliative Care Screening Complete  Skilled Nursing Facility Complete  Some recent data might be hidden

## 2020-01-04 NOTE — Evaluation (Signed)
Occupational Therapy Evaluation Patient Details Name: Alyssa [name] MRN: [id number] DOB: 1941/08/26 Today's Date: 01/04/2020    History of Present Illness Pt is a 79 y/o female admitted secondary to syncope and chest pressure. Found to have AKI. PMH includes PVD, HTN, CKD, dementia, and R THA.    Clinical Impression   Pt reports that she is mod I for ADL and transfers at home with DME like a shower chair and Rollator. Pt states that she does all her own bathing and dressing and some cleaning/picking up around the house. Today she was mod A for LB ADL, mod A for transfers, required sitting for grooming activites and had elevated BP (RN aware) She typically has specialized shoes for her leg discrepancy that are not present limiting true in room mobility. Pt does not remember her syncopal episode - baseline dementia. At this time I believe that she requires post-acute OT - she is currently declining SNF level therapy so will request HHOT to do falls risk assessment as well as maximize independence and safety in ADL and functional transfers.     Follow Up Recommendations  Home health OT;SNF;Supervision/Assistance - 24 hour(SNF would be best, but Pt will decline)    Equipment Recommendations  None recommended by OT(Pt has appropriate DME)    Recommendations for Other Services PT consult     Precautions / Restrictions Precautions Precautions: Fall Required Braces or Orthoses: Other Brace Other Brace: typically has specialized shoes for leg discrepancy Restrictions Weight Bearing Restrictions: No      Mobility Bed Mobility Overal bed mobility: Needs Assistance Bed Mobility: Supine to Sit     Supine to sit: Min guard     General bed mobility comments: min guard with use of rail  Transfers Overall transfer level: Needs assistance Equipment used: 1 person hand held assist Transfers: Sit to/from [facility] Sit to Stand: Min assist Stand pivot transfers: Mod  assist;Min assist       General transfer comment: min A for boost into standing, min A for balance and stedying for small pivotal steps to chair    Balance Overall balance assessment: Needs assistance Sitting-balance support: No upper extremity supported;Feet supported Sitting balance-Leahy Scale: Fair     Standing balance support: Bilateral upper extremity supported;During functional activity Standing balance-Leahy Scale: Poor Standing balance comment: Reliant on UE and external support                            ADL either performed or assessed with clinical judgement   ADL Overall ADL's : Needs assistance/impaired Eating/Feeding: Set up;Sitting   Grooming: Set up;Wash/dry hands;Wash/dry face;Sitting Grooming Details (indicate cue type and reason): in recliner, unable to stand at sink this session Upper Body Bathing: Minimal assistance;Sitting   Lower Body Bathing: Moderate assistance;Sitting/lateral leans Lower Body Bathing Details (indicate cue type and reason): assist for knees down Upper Body Dressing : Minimal assistance;Sitting Upper Body Dressing Details (indicate cue type and reason): to down gown like robe Lower Body Dressing: Moderate assistance Lower Body Dressing Details (indicate cue type and reason): able to adjust slippers without assist, but assist for items that require sit<>stand Toilet Transfer: Minimal assistance;Stand-pivot;BSC Toilet Transfer Details (indicate cue type and reason): face to face Toileting- Clothing Manipulation and Hygiene: Min guard;Sitting/lateral lean       Functional mobility during ADLs: Moderate assistance(small steps for SPT) General ADL Comments: decreased safety awareness, decreased balance, decreased activity tolerance     Vision  Patient Visual Report: No change from baseline       Perception     Praxis      Pertinent Vitals/Pain Pain Assessment: 0-10 Pain Score: 9  Pain Location: L hip Pain Descriptors  / Indicators: Discomfort;Constant Pain Intervention(s): Limited activity within patient's tolerance;Monitored during session;Heat applied;Patient requesting pain meds-RN notified;Repositioned     Hand Dominance Right   Extremity/Trunk Assessment Upper Extremity Assessment Upper Extremity Assessment: Generalized weakness   Lower Extremity Assessment Lower Extremity Assessment: RLE deficits/detail RLE Deficits / Details: RLE leg length discrepancy.    Cervical / Trunk Assessment Cervical / Trunk Assessment: Normal   Communication Communication Communication: HOH   Cognition Arousal/Alertness: Awake/alert Behavior During Therapy: WFL for tasks assessed/performed Overall Cognitive Status: History of cognitive impairments - at baseline                                 General Comments: Dementia at baseline, claims no falls - does not recall syncopal episode   General Comments  BP elevated 164/97 - RN in room and aware    Exercises     Shoulder Instructions      Home Living Family/patient expects to be discharged to:: Private residence Living Arrangements: Spouse/significant other(boyfriend) Available Help at Discharge: Family;Available 24 hours/day Type of Home: House Home Access: Stairs to enter [employer] of Steps: 3 Entrance Stairs-Rails: Right;Left;Can reach both Home Layout: One level     Bathroom Shower/Tub: Teacher, early years/pre: Standard Bathroom Accessibility: Yes How Accessible: Accessible via walker Home Equipment: [place] - 2 wheels;Walker - 4 wheels;Bedside commode;Shower seat   Additional Comments: Pt has specialized shoes that she wears      Prior Functioning/Environment Level of Independence: Independent with assistive device(s)        Comments: reports using a Rollator for mobility        OT Problem List: Decreased activity tolerance;Impaired balance (sitting and/or standing);Decreased safety  awareness;Pain      OT Treatment/Interventions: Self-care/ADL training;DME and/or AE instruction;Therapeutic activities;Patient/family education;Balance training    OT Goals(Current goals can be found in the care plan section) Acute Rehab OT Goals Patient Stated Goal: to get better and get home OT Goal Formulation: With patient Time For Goal Achievement: 01/18/20 Potential to Achieve Goals: Good ADL Goals Pt Will Perform Grooming: with supervision;standing Pt Will Perform Upper Body Dressing: with set-up;sitting Pt Will Perform Lower Body Dressing: with min guard assist;with caregiver independent in assisting;sit to/from stand Pt Will Transfer to Toilet: with min guard assist Pt Will Perform Toileting - Clothing Manipulation and hygiene: with set-up;sitting/lateral leans Pt Will Perform Tub/Shower Transfer: with min guard assist;with caregiver independent in assisting;ambulating;shower seat Additional ADL Goal #1: Pt will verbalize 3 ways to conserve energy during ADL routine with 1 or less cues  OT Frequency: Min 2X/week   Barriers to D/C:            Co-evaluation              AM-PAC OT "6 Clicks" Daily Activity     Outcome Measure Help from another person eating meals?: A Little Help from another person taking care of personal grooming?: A Little Help from another person toileting, which includes using toliet, bedpan, or urinal?: A Lot Help from another person bathing (including washing, rinsing, drying)?: A Lot Help from another person to put on and taking off regular upper body clothing?: A Little Help from another person to  put on and taking off regular lower body clothing?: A Lot 6 Click Score: 15   End of Session Equipment Utilized During Treatment: Gait belt Nurse Communication: Mobility status  Activity Tolerance: Patient tolerated treatment well Patient left: in chair;with call bell/phone within reach;with chair alarm set;with nursing/sitter in room  OT Visit  Diagnosis: Unsteadiness on feet (R26.81);Other abnormalities of gait and mobility (R26.89);Muscle weakness (generalized) (M62.81);Other symptoms and signs involving cognitive function;Pain Pain - Right/Left: Left Pain - part of body: Hip                Time: 8338-2505 OT Time Calculation (min): 27 min Charges:  OT General Charges $OT Visit: 1 Visit OT Evaluation $OT Eval Moderate Complexity: 1 Mod OT Treatments $Self Care/Home Management : 8-22 mins  Jesse Sans OTR/L Acute Rehabilitation Services Pager: [phone] Office: [place] 01/04/2020, 12:07 PM

## 2020-01-04 NOTE — Progress Notes (Addendum)
PROGRESS NOTE    Alyssa [name]  IOE:[id number] DOB: 13-Jan-1941 DOA: 01/02/2020 PCP: Loretha Brasil, FNP     Brief Narrative:  79 y.o. BF PMHx Anxiety, Dementia CKD stage IV, solitary kidney, PVD, HTN, HLD, anemia, Subarachnoid hemorrhage managed conservatively in December 2020, antiplatelet therapy was restarted on September 23, 2019,Peripheral neuropathy  Brought to the hospital after patient was found on the floor.  She complained of severe shortness of breath and chest pressure/mild chest pain for past 3 days.  There is questionable history of syncope as patient says that she passed out but as per son she was on lying on the floor but did not pass out.  In the ED troponin x2 were unremarkable.  She was found to have elevated D-dimer, CTA chest could not be obtained as patient has worsening renal function.  She denies nausea vomiting or diarrhea. Denies abdominal pain or dysuria. No previous history of stroke or seizures. She was found to have elevated blood pressure 186/99. 10 mg IV hydralazine x1 was given in the ED.    Subjective: A/O x4, some short-term memory loss/dementia, follows all commands.  Extremely weak difficulty rising to sitting position.  Negative S OB, negative CP.  Malnourished   Assessment & Plan:   Principal Problem:   AKI (acute kidney injury) ([facility]) Active Problems:   PVD (peripheral vascular disease) with claudication (HCC)   Gait difficulty   Syncope   Essential hypertension   Hyperlipidemia   CKD (chronic kidney disease) stage 4, GFR 15-29 ml/min (HCC)   Depression with anxiety   SAH (subarachnoid hemorrhage) (HCC)   Dementia (HCC)   Hyperkalemia   Severe protein-calorie malnutrition (HCC)   Acute on CKD stage IV/ non anion gap metabolic acidosis.  (Best Baseline Cr 1.61 on 11/04/2019) -Sodium bicarbonate plus D5W at 59ml/hr* -3/19 consulted nephrology Dr. Rodman Key Recent Labs  Lab 01/02/20 1246 01/02/20 2328 01/03/20 0602  01/04/20 0233  CREATININE 2.82* 2.45* 2.37* 2.11*  -Continue with above hydration unless nephrology changes. -Strict in and out +2.2 L* -Daily weight  Essential HTN -3/19 isosorbide-hydralazine 20-37.5 mg TID (half the dose she takes at home) -Metoprolol 25 mg BID   3. Syncope.  Likely multifactorial, considering AKI and hypovolemia, can not rule out orthostatic. Will continue hydration with IV fluids and will plan to check on orthostatic vitals in am. Continue PT and OT.   V/Q scan low probability for pulmonary embolism. Follow up on echocardiogram.    PVD -ASA -Crestor 40 mg daily -Clopidogrel 75 mg daily  4. Dementia.  Continue with donepezil, buspirone, duloxetine, temazepam and citalopram. Consult nutrition.   Iron deficiency anemia.?  -Continue iron supplements.  -Anemia panel  Severe protein calorie malnutrition -3/19 nutrition consult  Goals of care -3/19 palliative care; evaluate for palliative care vs change of code to DNR vs hospice     DVT prophylaxis: Heparin subcu Code Status: Full Family Communication:  Disposition Plan: Awaiting PT/OT recommendation.  Awaiting nephrology recommendation. 1.  Where the patient is from; home 2.  Anticipated d/c place. 3.  Barriers to d/c barrier for dc AKI on CKD stage IV with acidosis, needing IV fluids with bicarb infusion. Possible dc home in am.     Consultants:  nephrology Dr. Rodman Key    Procedures/Significant Events:    I have personally reviewed and interpreted all radiology studies and my findings are as above.  VENTILATOR SETTINGS:    Cultures   Antimicrobials: Anti-infectives (From admission, onward)   None  Devices    LINES / TUBES:      Continuous Infusions: .  sodium bicarbonate  infusion 1000 mL       Objective: Vitals:   01/03/20 2252 01/04/20 0411 01/04/20 0828 01/04/20 1135  BP: (!) 149/72 (!) 167/86 (!) 186/99 (!) 178/94  Pulse: 83  84 80  Resp:  15     Temp:  98.1 F (36.7 C) 98.3 F (36.8 C) 98.9 F (37.2 C)  TempSrc:  Oral Oral Oral  SpO2:  98% 100% 100%  Weight:  42.8 kg    Height:        Intake/Output Summary (Last 24 hours) at 01/04/2020 1316 Last data filed at 01/04/2020 0555 Gross per 24 hour  Intake 897.48 ml  Output 300 ml  Net 597.48 ml   Filed Weights   01/02/20 2157 01/03/20 0529 01/04/20 0411  Weight: 40.4 kg 40.4 kg 42.8 kg    Examination:  General: A/O x4, no acute respiratory distress, cachectic Eyes: negative scleral hemorrhage, negative anisocoria, negative icterus ENT: Negative Runny nose, negative gingival bleeding, Neck:  Negative scars, masses, torticollis, lymphadenopathy, JVD Lungs: Clear to auscultation bilaterally without wheezes or crackles Cardiovascular: Regular rate and rhythm without murmur gallop or rub normal S1 and S2 Abdomen: negative abdominal pain, nondistended, positive soft, bowel sounds, no rebound, no ascites, no appreciable mass Extremities: No significant cyanosis, clubbing, or edema bilateral lower extremities Skin: Negative rashes, lesions, ulcers Psychiatric:  Negative depression, negative anxiety, negative fatigue, negative mania, EXTREMELY POOR understanding of her disease state. Central nervous system:  Cranial nerves II through XII intact, tongue/uvula midline, all extremities muscle strength 3-4/5, sensation intact throughout, negative dysarthria, negative expressive aphasia, negative receptive aphasia.  .     Data Reviewed: Care during the described time interval was provided by me .  I have reviewed this patient's available data, including medical history, events of note, physical examination, and all test results as part of my evaluation.  CBC: Recent Labs  Lab 01/02/20 1246 01/02/20 2328 01/03/20 0207 01/04/20 1002  WBC 7.7 9.3 8.4 4.5  NEUTROABS  --   --   --  2.7  HGB 10.0* 10.7* 10.3* 9.1*  HCT 32.2* 34.6* 31.7* 26.8*  MCV 94.7 97.7 92.7 89.9  PLT 342  309 288 378   Basic Metabolic Panel: Recent Labs  Lab 01/02/20 1246 01/02/20 2328 01/03/20 0602 01/04/20 0233 01/04/20 1002  NA 138  --  142 139  --   K 6.0*  --  4.7 3.9  --   CL 117*  --  121* 117*  --   CO2 9*  --  8* 12*  --   GLUCOSE 187*  --  108* 104*  --   BUN 55*  --  54* 48*  --   CREATININE 2.82* 2.45* 2.37* 2.11*  --   CALCIUM 9.8  --  9.3 8.5*  --   MG  --   --   --   --  1.8  PHOS  --   --   --   --  2.8   GFR: Estimated Creatinine Clearance: 15.3 mL/min (A) (by C-G formula based on SCr of 2.11 mg/dL (H)). Liver Function Tests: Recent Labs  Lab 01/03/20 0602  AST 22  ALT 14  ALKPHOS 61  BILITOT 0.7  PROT 6.3*  ALBUMIN 3.7   No results for input(s): LIPASE, AMYLASE in the last 168 hours. No results for input(s): AMMONIA in the last 168 hours. Coagulation Profile: No results  for input(s): INR, PROTIME in the last 168 hours. Cardiac Enzymes: Recent Labs  Lab 01/02/20 1430 01/04/20 0233  CKTOTAL 1,075* 485*   BNP (last 3 results) No results for input(s): PROBNP in the last 8760 hours. HbA1C: No results for input(s): HGBA1C in the last 72 hours. CBG: No results for input(s): GLUCAP in the last 168 hours. Lipid Profile: No results for input(s): CHOL, HDL, LDLCALC, TRIG, CHOLHDL, LDLDIRECT in the last 72 hours. Thyroid Function Tests: No results for input(s): TSH, T4TOTAL, FREET4, T3FREE, THYROIDAB in the last 72 hours. Anemia Panel: Recent Labs    01/04/20 1002  VITAMINB12 506  FOLATE 35.3  FERRITIN 428*  TIBC 234*  IRON 57  RETICCTPCT 2.4   Sepsis Labs: No results for input(s): PROCALCITON, LATICACIDVEN in the last 168 hours.  Recent Results (from the past 240 hour(s))  SARS CORONAVIRUS 2 (TAT 6-24 HRS) Nasopharyngeal Nasopharyngeal Swab     Status: None   Collection Time: 01/02/20  4:49 PM   Specimen: Nasopharyngeal Swab  Result Value Ref Range Status   SARS Coronavirus 2 NEGATIVE NEGATIVE Final    Comment: (NOTE) SARS-CoV-2 target  nucleic acids are NOT DETECTED. The SARS-CoV-2 RNA is generally detectable in upper and lower respiratory specimens during the acute phase of infection. Negative results do not preclude SARS-CoV-2 infection, do not rule out co-infections with other pathogens, and should not be used as the sole basis for treatment or other patient management decisions. Negative results must be combined with clinical observations, patient history, and epidemiological information. The expected result is Negative. Fact Sheet for Patients: SugarRoll.be Fact Sheet for Healthcare Providers: https://www.-mathews.com/ This test is not yet approved or cleared by the [place] FDA and  has been authorized for detection and/or diagnosis of SARS-CoV-2 by FDA under an Emergency Use Authorization (EUA). This EUA will remain  in effect (meaning this test can be used) for the duration of the COVID-19 declaration under Section 56 4(b)(1) of the Act, 21 U.S.C. section 360bbb-3(b)(1), unless the authorization is terminated or revoked sooner. Performed at [facility], [place] [address] [place], [place] [address]          Radiology Studies: CT ABDOMEN PELVIS WO CONTRAST  Result Date: 01/02/2020 CLINICAL DATA:  Syncope. Back pain. Patient cannot tolerate intravenous contrast. EXAM: CT CHEST, ABDOMEN AND PELVIS WITHOUT CONTRAST TECHNIQUE: Multidetector CT imaging of the chest, abdomen and pelvis was performed following the standard protocol without IV contrast. COMPARISON:  None. FINDINGS: CT CHEST FINDINGS Cardiovascular: Heart normal in size. Three-vessel coronary artery calcifications. No pericardial effusion. Aortic atherosclerotic calcifications. No displacement of calcifications away from the aortic wall to suggest a dissection. Atherosclerotic calcifications extend into the aortic arch branch vessels. Mediastinum/Nodes: No neck base, mediastinal or hilar masses or  enlarged lymph nodes. Trachea and esophagus are unremarkable. Moderate hiatal hernia. Lungs/Pleura: Minor dependent lung base atelectasis. Moderate centrilobular emphysema. No evidence of pneumonia or pulmonary edema. No mass or suspicious nodule. No pleural effusion or pneumothorax. Musculoskeletal: No fracture or acute finding. No aggressive bone lesion. Significant degenerative changes throughout the thoracic spine. Arthropathic changes of both shoulders, a pattern consistent with an inflammatory arthropathy. CT ABDOMEN PELVIS FINDINGS Hepatobiliary: No focal liver abnormality is seen. No gallstones, gallbladder wall thickening, or biliary dilatation. Pancreas: Unremarkable. No pancreatic ductal dilatation or surrounding inflammatory changes. Spleen: Normal in size without focal abnormality. Adrenals/Urinary Tract: No adrenal masses. Right renal cortical thinning with a reduced right kidney size. Normal size of the left kidney. No renal  masses. No collecting system stones. No hydronephrosis. Ureters not well defined, but grossly normal in course and caliber. Bladder is decompressed, otherwise unremarkable. Stomach/Bowel: Moderate hiatal hernia. Stomach otherwise unremarkable. Small bowel and colon are normal in caliber. No wall thickening or inflammation. Moderate increased colonic stool. Left colon diverticula. No diverticulitis. Normal appendix. Vascular/Lymphatic: Diffuse aortic atherosclerosis extending into the branch vessels. 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 Reproductive: Densely calcified small uterine fibroid. Otherwise unremarkable. Other: No hernia or ascites. Musculoskeletal: No acute fracture. No aggressive bone lesion. 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 Previous L4-L5 lumbar spine fusion. Degenerative changes noted of the lumbar spine. IMPRESSION: 1. No acute findings within the chest, abdomen or pelvis.  2. Exam limited for assessment of aortic dissection. Allowing for this, there is no displacement of calcifications along the aortic intima to suggest a dissection. 3. Moderate emphysema. 4. Right renal atrophy. 5. Colonic diverticula and moderate increased colonic stool burden. No bowel inflammation. 6. Moderate hiatal hernia. 7. Diffuse aortic atherosclerosis. 8. No acute skeletal abnormality. Significant thoracolumbar degenerative changes. Bilateral shoulder arthropathic changes suggesting an inflammatory arthropathy such as rheumatoid arthritis. Electronically Signed   By: Lajean Manes M.D.   On: 01/02/2020 15:19   CT Head Wo Contrast  Result Date: 01/02/2020 CLINICAL DATA:  Head trauma. Suspect intracranial arterial injury. EXAM: CT HEAD WITHOUT CONTRAST TECHNIQUE: Contiguous axial images were obtained from the base of the skull through the vertex without intravenous contrast. COMPARISON:  September 19, 2019 FINDINGS: Brain: No evidence of acute infarction, hemorrhage, hydrocephalus, extra-axial collection or mass lesion/mass effect. Empty sella. Chronic white matter microvascular ischemic changes. Vascular: No hyperdense vessel. Atherosclerosis of the bilateral carotid siphons. Skull: Demineralized. No skull fracture. Stable small benign dural calcification. Sinuses/Orbits: Mild paranasal sinus mucosal thickening. Normal orbital soft tissues. 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 Cerumen in the left external auditory canal. Trace bilateral mastoid fluid. IMPRESSION: No CT evidence of acute intracranial abnormality. Electronically Signed   By: Revonda Humphrey   On: 01/02/2020 15:01   CT CHEST WO CONTRAST  Result Date: 01/02/2020 CLINICAL DATA:  Syncope. Back pain. Patient cannot tolerate intravenous contrast. EXAM: CT CHEST, ABDOMEN AND PELVIS WITHOUT CONTRAST TECHNIQUE: Multidetector CT imaging of the chest, abdomen and pelvis was performed following the standard protocol without IV  contrast. COMPARISON:  None. FINDINGS: CT CHEST FINDINGS Cardiovascular: Heart normal in size. Three-vessel coronary artery calcifications. No pericardial effusion. Aortic atherosclerotic calcifications. No displacement of calcifications away from the aortic wall to suggest a dissection. Atherosclerotic calcifications extend into the aortic arch branch vessels. Mediastinum/Nodes: No neck base, mediastinal or hilar masses or enlarged lymph nodes. Trachea and esophagus are unremarkable. Moderate hiatal hernia. Lungs/Pleura: Minor dependent lung base atelectasis. Moderate centrilobular emphysema. No evidence of pneumonia or pulmonary edema. No mass or suspicious nodule. No pleural effusion or pneumothorax. Musculoskeletal: No fracture or acute finding. No aggressive bone lesion. Significant degenerative changes throughout the thoracic spine. Arthropathic changes of both shoulders, a pattern consistent with an inflammatory arthropathy. CT ABDOMEN PELVIS FINDINGS Hepatobiliary: No focal liver abnormality is seen. No gallstones, gallbladder wall thickening, or biliary dilatation. Pancreas: Unremarkable. No pancreatic ductal dilatation or surrounding inflammatory changes. Spleen: Normal in size without focal abnormality. Adrenals/Urinary Tract: No adrenal masses. Right renal cortical thinning with a reduced right kidney size. Normal size of the left kidney. No renal masses. No collecting system stones. No hydronephrosis. Ureters not well  defined, but grossly normal in course and caliber. Bladder is decompressed, otherwise unremarkable. Stomach/Bowel: Moderate hiatal hernia. Stomach otherwise unremarkable. Small bowel and colon are normal in caliber. No wall thickening or inflammation. Moderate increased colonic stool. Left colon diverticula. No diverticulitis. Normal appendix. Vascular/Lymphatic: Diffuse aortic atherosclerosis extending into the branch vessels. 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 Reproductive: Densely calcified small uterine fibroid. Otherwise unremarkable. Other: No hernia or ascites. Musculoskeletal: No acute fracture. No aggressive bone lesion. 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 Previous L4-L5 lumbar spine fusion. Degenerative changes noted of the lumbar spine. IMPRESSION: 1. No acute findings within the chest, abdomen or pelvis. 2. Exam limited for assessment of aortic dissection. Allowing for this, there is no displacement of calcifications along the aortic intima to suggest a dissection. 3. Moderate emphysema. 4. Right renal atrophy. 5. Colonic diverticula and moderate increased colonic stool burden. No bowel inflammation. 6. Moderate hiatal hernia. 7. Diffuse aortic atherosclerosis. 8. No acute skeletal abnormality. Significant thoracolumbar degenerative changes. Bilateral shoulder arthropathic changes suggesting an inflammatory arthropathy such as rheumatoid arthritis. Electronically Signed   By: Lajean Manes M.D.   On: 01/02/2020 15:19   CT Cervical Spine Wo Contrast  Result Date: 01/02/2020 CLINICAL DATA:  Syncope.  Chest pain. EXAM: CT CERVICAL SPINE WITHOUT CONTRAST TECHNIQUE: Multidetector CT imaging of the cervical spine was performed without intravenous contrast. Multiplanar CT image reconstructions were also generated. COMPARISON:  09/19/2019 FINDINGS: Alignment: Grade 1 anterolisthesis, C4 on C5 and C7 on T1, both degenerative. No traumatic subluxation. Skull base and vertebrae: No acute fracture. No primary bone lesion or focal pathologic process. Soft tissues and spinal canal: No prevertebral fluid or swelling. No visible canal hematoma. Disc levels: There are significant degenerative changes throughout the cervical spine with marked loss of disc height at C3-C4, moderate loss of disc height C5-C6 and C6-C7 and marked loss of disc height C7-T1. There are bilateral facet degenerative changes. No  convincing disc herniation. Upper chest: No acute findings. Lung apices show emphysema. Is mild enlargement of the thyroid gland with small hypo attenuation areas consistent nodules, stable. No specific imaging follow-up recommended. Other: None. IMPRESSION: 1. No fracture or acute finding. 2. Advanced degenerative changes which are unchanged from the prior cervical CT. Electronically Signed   By: Lajean Manes M.D.   On: 01/02/2020 15:23   NM Pulmonary Perfusion  Result Date: 01/02/2020 CLINICAL DATA:  79 year old female with no pelvis in lung scan. EXAM: NUCLEAR MEDICINE PERFUSION LUNG SCAN TECHNIQUE: Perfusion images were obtained in multiple projections after intravenous injection of radiopharmaceutical. Ventilation scans intentionally deferred if perfusion scan and chest x-ray adequate for interpretation during COVID 19 epidemic. RADIOPHARMACEUTICALS:  1.7 mCi Tc-15m MAA IV COMPARISON:  Chest radiograph dated 01/02/2020 and CT dated 01/02/2020 FINDINGS: There is homogeneous and symmetric perfusion of the lungs. No perfusion defect identified. IMPRESSION: Normal perfusion lung scan. Electronically Signed   By: Anner Crete M.D.   On: 01/02/2020 20:12   ECHOCARDIOGRAM COMPLETE  Result Date: 01/03/2020    ECHOCARDIOGRAM REPORT   Patient Name:   Alyssa [name] Date of Exam: 01/03/2020 Medical Rec #:  [id number]            Height:       63.0 in Accession #:    [id number]           Weight:       89.1 lb Date of Birth:  Aug 13, 1941           BSA:          1.371 m Patient Age:    76 years             BP:           117/75 mmHg Patient Gender: F                    HR:           74 bpm. Exam Location:  Inpatient Procedure: 2D Echo Indications:    780.2 syncope  History:        Patient has prior history of Echocardiogram examinations, most                 recent 12/11/2018. Risk Factors:Hypertension, Dyslipidemia and                 Current Smoker.  Sonographer:    Jannett Celestine RDCS (AE) Referring Phys:  Earnie Larsson LAMA  Sonographer Comments: Technically difficult study due to poor echo windows. IMPRESSIONS  1. There is severe concentric LVH normal LVEF, 60-65%. Findings could represent hypertensive heart disease versus infiltrative disorder such as cardiac amyoloidosis. Would consider a PYP scan to exclude ATTR cardiac amyloid.  2. Left ventricular ejection fraction, by estimation, is 60 to 65%. The left ventricle has normal function. The left ventricle has no regional wall motion abnormalities. There is severe concentric left ventricular hypertrophy. Left ventricular diastolic  parameters are consistent with Grade II diastolic dysfunction (pseudonormalization). Elevated left atrial pressure.  3. Right ventricular systolic function is normal. The right ventricular size is normal. Tricuspid regurgitation signal is inadequate for assessing PA pressure. The estimated right ventricular systolic pressure is 60.6 mmHg.  4. Left atrial size was mildly dilated.  5. The mitral valve is degenerative. No evidence of mitral valve regurgitation. No evidence of mitral stenosis.  6. The aortic valve is grossly normal. Aortic valve regurgitation is not visualized. No aortic stenosis is present.  7. The inferior vena cava is normal in size with <50% respiratory variability, suggesting right atrial pressure of 8 mmHg. FINDINGS  Left Ventricle: Left ventricular ejection fraction, by estimation, is 60 to 65%. The left ventricle has normal function. The left ventricle has no regional wall motion abnormalities. The left ventricular internal cavity size was normal in size. There is  severe concentric left ventricular hypertrophy. Left ventricular diastolic parameters are consistent with Grade II diastolic dysfunction (pseudonormalization). Elevated left atrial pressure. Right Ventricle: The right ventricular size is normal. No increase in right ventricular wall thickness. Right ventricular systolic function is normal. Tricuspid  regurgitation signal is inadequate for assessing PA pressure. The tricuspid regurgitant velocity is 1.85 m/s, and with an assumed right atrial pressure of 8 mmHg, the estimated right ventricular systolic pressure is 30.1 mmHg. Left Atrium: Left atrial size was mildly dilated. Right Atrium: Right atrial size was normal in size. Pericardium: Trivial pericardial effusion is present. Mitral Valve: The mitral valve is degenerative in appearance. There is mild thickening of the mitral valve leaflet(s). Mild mitral annular calcification. No evidence of mitral valve regurgitation. No evidence of mitral valve stenosis. Tricuspid Valve: The tricuspid valve is grossly normal. Tricuspid valve regurgitation is trivial. No evidence of tricuspid stenosis. Aortic Valve: The aortic valve is grossly normal. Aortic valve regurgitation is not visualized. No aortic stenosis is present. Pulmonic Valve: The pulmonic valve was grossly normal. Pulmonic valve regurgitation is not visualized. No evidence  of pulmonic stenosis. Aorta: The aortic root is normal in size and structure. Venous: The inferior vena cava is normal in size with less than 50% respiratory variability, suggesting right atrial pressure of 8 mmHg. IAS/Shunts: No atrial level shunt detected by color flow Doppler. Additional Comments: There is severe concentric LVH normal LVEF, 60-65%. Findings could represent hypertensive heart disease versus infiltrative disorder such as cardiac amyoloidosis. Would consider a PYP scan to exclude ATTR cardiac amyloid.  LEFT VENTRICLE PLAX 2D LVIDd:         3.50 cm  Diastology LVIDs:         2.40 cm  LV e' lateral:   5.11 cm/s LV PW:         1.20 cm  LV E/e' lateral: 15.2 LV IVS:        1.40 cm  LV e' medial:    4.24 cm/s LVOT diam:     2.00 cm  LV E/e' medial:  18.3 LV SV:         73 LV SV Index:   53 LVOT Area:     3.14 cm  RIGHT VENTRICLE RV S prime:     10.20 cm/s TAPSE (M-mode): 1.6 cm LEFT ATRIUM             Index       RIGHT ATRIUM            Index LA diam:        3.30 cm 2.41 cm/m  RA Area:     10.10 cm LA Vol (A2C):   25.5 ml 18.60 ml/m RA Volume:   19.10 ml  13.93 ml/m LA Vol (A4C):   23.4 ml 17.07 ml/m LA Biplane Vol: 24.9 ml 18.16 ml/m  AORTIC VALVE LVOT Vmax:   94.60 cm/s LVOT Vmean:  70.200 cm/s LVOT VTI:    0.231 m  AORTA Ao Root diam: 3.20 cm MITRAL VALVE               TRICUSPID VALVE MV Area (PHT): 2.37 cm    TR Peak grad:   13.6 mmHg MV Decel Time: 320 msec    TR Vmax:        184.60 cm/s MV E velocity: 77.60 cm/s MV A velocity: 97.30 cm/s  SHUNTS MV E/A ratio:  0.80        Systemic VTI:  0.23 m                            Systemic Diam: 2.00 cm Eleonore Chiquito MD Electronically signed by Eleonore Chiquito MD Signature Date/Time: 01/03/2020/11:21:07 AM    Final         Scheduled Meds: . aspirin EC  81 mg Oral Daily  . citalopram  20 mg Oral Daily  . clopidogrel  75 mg Oral Daily  . donepezil  5 mg Oral Daily  . DULoxetine  30 mg Oral Daily  . feeding supplement (ENSURE ENLIVE)  237 mL Oral TID BM  . ferrous sulfate  325 mg Oral Q breakfast  . gabapentin  100 mg Oral TID  . heparin  5,000 Units Subcutaneous Q8H  . isosorbide-hydrALAZINE  1 tablet Oral TID  . metoprolol tartrate  25 mg Oral BID  . multivitamin with minerals  1 tablet Oral Daily  . pantoprazole  40 mg Oral QAC breakfast  . rosuvastatin  40 mg Oral Daily  . sodium bicarbonate  1,300 mg Oral BID  . sodium chloride  flush  3 mL Intravenous Once   Continuous Infusions: .  sodium bicarbonate  infusion 1000 mL       LOS: 1 day    Time spent:40 min    Cashis Rill, Geraldo Docker, MD [facility] Pager [phone]  If 7PM-7AM, please contact night-coverage [email or web address] Password [email or web address] 01/04/2020, 1:16 PM

## 2020-01-05 ENCOUNTER — Inpatient Hospital Stay (HOSPITAL_COMMUNITY): Payer: Medicare HMO

## 2020-01-05 DIAGNOSIS — E7800 Pure hypercholesterolemia, unspecified: Secondary | ICD-10-CM

## 2020-01-05 DIAGNOSIS — K5900 Constipation, unspecified: Secondary | ICD-10-CM

## 2020-01-05 DIAGNOSIS — S2232XK Fracture of one rib, left side, subsequent encounter for fracture with nonunion: Secondary | ICD-10-CM

## 2020-01-05 LAB — COMPREHENSIVE METABOLIC PANEL
ALT: 11 U/L (ref 0–44)
AST: 19 U/L (ref 15–41)
Albumin: 3 g/dL — ABNORMAL LOW (ref 3.5–5.0)
Alkaline Phosphatase: 55 U/L (ref 38–126)
Anion gap: 12 (ref 5–15)
BUN: 42 mg/dL — ABNORMAL HIGH (ref 8–23)
CO2: 18 mmol/L — ABNORMAL LOW (ref 22–32)
Calcium: 8.5 mg/dL — ABNORMAL LOW (ref 8.9–10.3)
Chloride: 110 mmol/L (ref 98–111)
Creatinine, Ser: 1.86 mg/dL — ABNORMAL HIGH (ref 0.44–1.00)
GFR calc Af Amer: 30 mL/min — ABNORMAL LOW (ref >60)
GFR calc non Af Amer: 26 mL/min — ABNORMAL LOW (ref >60)
Glucose, Bld: 104 mg/dL — ABNORMAL HIGH (ref 70–99)
Potassium: 3.8 mmol/L (ref 3.5–5.1)
Sodium: 140 mmol/L (ref 135–145)
Total Bilirubin: 0.5 mg/dL (ref 0.3–1.2)
Total Protein: 5.6 g/dL — ABNORMAL LOW (ref 6.5–8.1)

## 2020-01-05 LAB — CBC
HCT: 23.5 % — ABNORMAL LOW (ref 36.0–46.0)
Hemoglobin: 8 g/dL — ABNORMAL LOW (ref 12.0–15.0)
MCH: 30.3 pg (ref 26.0–34.0)
MCHC: 34 g/dL (ref 30.0–36.0)
MCV: 89 fL (ref 80.0–100.0)
Platelets: 294 10*3/uL (ref 150–400)
RBC: 2.64 MIL/uL — ABNORMAL LOW (ref 3.87–5.11)
RDW: 15.2 % (ref 11.5–15.5)
WBC: 4.9 10*3/uL (ref 4.0–10.5)
nRBC: 0 % (ref 0.0–0.2)

## 2020-01-05 LAB — MAGNESIUM: Magnesium: 1.8 mg/dL (ref 1.7–2.4)

## 2020-01-05 LAB — PHOSPHORUS: Phosphorus: 2.4 mg/dL — ABNORMAL LOW (ref 2.5–4.6)

## 2020-01-05 MED ORDER — SORBITOL 70 % SOLN
960.0000 mL | TOPICAL_OIL | Freq: Once | ORAL | Status: AC
  Administered 2020-01-05: 960 mL via RECTAL
  Filled 2020-01-05: qty 473

## 2020-01-05 MED ORDER — METOPROLOL TARTRATE 50 MG PO TABS
50.0000 mg | ORAL_TABLET | Freq: Two times a day (BID) | ORAL | Status: DC
  Administered 2020-01-05 – 2020-01-06 (×2): 50 mg via ORAL
  Filled 2020-01-05 (×2): qty 1

## 2020-01-05 MED ORDER — HYDRALAZINE HCL 20 MG/ML IJ SOLN
10.0000 mg | INTRAMUSCULAR | Status: DC | PRN
  Administered 2020-01-05 – 2020-01-07 (×6): 10 mg via INTRAVENOUS
  Filled 2020-01-05 (×6): qty 1

## 2020-01-05 MED ORDER — ISOSORB DINITRATE-HYDRALAZINE 20-37.5 MG PO TABS
2.0000 | ORAL_TABLET | Freq: Three times a day (TID) | ORAL | Status: DC
  Administered 2020-01-05 – 2020-01-08 (×10): 2 via ORAL
  Filled 2020-01-05 (×10): qty 2

## 2020-01-05 NOTE — Progress Notes (Signed)
Brief Nutrition Note  RD consulted to assess patient's nutrition status. RD already following. Please see RD note on 01/03/20.   Larkin Ina, MS, RD, LDN RD pager number and weekend/on-call pager number located in [place].

## 2020-01-05 NOTE — Progress Notes (Signed)
Patient's BP 206/115. All other vital signs stable. Patient with change in MEWS from green to yellow r/t BP. Patient also c/o 10/10 sharp abdominal pain. Administered scheduled morning meds to patient and notified Dr. Sherral Hammers via text page at this time. Initiated yellow MEWS score protocol.

## 2020-01-05 NOTE — Progress Notes (Addendum)
PROGRESS NOTE    Alyssa [name]  IOX:[id number] DOB: 05-18-1941 DOA: 01/02/2020 PCP: Loretha Brasil, FNP     Brief Narrative:  79 y.o. BF PMHx Anxiety, Dementia CKD stage IV, solitary kidney, PVD, HTN, HLD, anemia, Subarachnoid hemorrhage managed conservatively in December 2020, antiplatelet therapy was restarted on September 23, 2019,Peripheral neuropathy  Brought to the hospital after patient was found on the floor.  She complained of severe shortness of breath and chest pressure/mild chest pain for past 3 days.  There is questionable history of syncope as patient says that she passed out but as per son she was on lying on the floor but did not pass out.  In the ED troponin x2 were unremarkable.  She was found to have elevated D-dimer, CTA chest could not be obtained as patient has worsening renal function.  She denies nausea vomiting or diarrhea. Denies abdominal pain or dysuria. No previous history of stroke or seizures. She was found to have elevated blood pressure 186/99. 10 mg IV hydralazine x1 was given in the ED.    Subjective: 3/20 A/O x4, some short-term confusion.  States does not remember seeing me then with some prodding does recall that I saw her yesterday.  Negative S OB, negative CP.  Complains of left sided lateral chest pain/abdominal pain.  Unsure if she fell and hit anything on her way to the floor.   Assessment & Plan:   Principal Problem:   AKI (acute kidney injury) ([place]) Active Problems:   PVD (peripheral vascular disease) with claudication ([place])   Gait difficulty   Syncope   Essential hypertension   Hyperlipidemia   CKD (chronic kidney disease) stage 4, GFR 15-29 ml/min (HCC)   Depression with anxiety   SAH (subarachnoid hemorrhage) (HCC)   Dementia (HCC)   Hyperkalemia   Severe protein-calorie malnutrition ([place])   Acute on chronic renal failure (HCC)   Constipation   Acute on CKD stage IV/ non anion gap metabolic acidosis.  (Best Baseline Cr  1.61 on 11/04/2019) -3/20 DC per nephrology sodium bicarbonate plus D5W at 36ml/hr* -3/20 per nephrology Dr. Rodman Key start sodium bicarbonate tablets 1300 mg BID Recent Labs  Lab 01/02/20 2328 01/03/20 0602 01/04/20 0233 01/04/20 1641 01/05/20 0238  CREATININE 2.45* 2.37* 2.11* 1.86* 1.86*  -Strict in and out +1.0 L -Daily weight Filed Weights   01/03/20 0529 01/04/20 0411 01/05/20 0456  Weight: 40.4 kg 42.8 kg 44.8 kg  -3/20 patient to follow-up with Dr. Joelyn Oms as outpatient. Nephrology will sign off   Essential HTN -3/20 increase isosorbide-hydralazine 20-37.5 mg 2 tablets TID  -3/20 increase Metoprolol 50 mg BID -3/20 Hydralazine PRN  Syncope -Multifactorial acute on CKD stage IV, dehydration, anorexia, overall poor condition.  -Daily orthostatic vitals -V/Q scan low probability for pulmonary embolism. Follow up on echocardiogram.    PVD -ASA -Crestor 40 mg daily -Clopidogrel 75 mg daily  Dementia.  -Donezepil -Buspirone -Duloxetine -Temazepam -Citalopram  Iron deficiency anemia.?  -Continue iron supplements.  -Anemia panel  Severe protein calorie malnutrition -3/19 nutrition consult; diet effective currently.  Left abdominal pain -Most likely secondary to diverticula and moderate stool burden (constipation) -SMOG enema  Rib fractures -Chronic left rib fractures see results below  Goals of care -3/19 palliative care; evaluate for palliative care vs change of code to DNR vs hospice     DVT prophylaxis: Heparin subcu Code Status: Full Family Communication: 3/20 son at bedside discussed plan of care answered all questions Disposition Plan: Awaiting PT/OT recommendation.  Awaiting nephrology recommendation. 1.  Where the patient is from; home 2.  Anticipated d/c place. 3.  Barriers to d/c 3/20 consult placed to LCSW for SNF placement     Consultants:  nephrology Dr. Rodman Key    Procedures/Significant Events:  3/17 CXR; negative acute  disease.-Moderate hiatal hernia 3/17 CT head Wo contrast; negative acute intracranial abnormality 3/17 CT C-spine W. Wo contrast;significant degenerative changes throughout the cervical spine with marked loss of disc height at C3-C4,-moderate loss of disc height C5-C6 and C6-C7 and marked loss of discheight C7-T1. There are bilateral facet degenerative changes. No convincing disc herniation. 3/17 CT chest W. Wo contrast;Moderate emphysema. -Right renal atrophy. -Colonic diverticula and moderate increased colonic stool burden.No bowel inflammation. -Moderate hiatal hernia. - Diffuse aortic atherosclerosis. -No acute skeletal abnormality. Significant thoracolumbar degenerative changes. Bilateral shoulder arthropathic changes suggesting an inflammatory arthropathy such as rheumatoid arthritis. 3/17 CT abdomen pelvis W. Wo contrast;-no acute findings within the chest, abdomen or pelvis. -Moderate emphysema. 3/20 DG ribs bilateral; -Positive chronic first and sixth rib fracture    I have personally reviewed and interpreted all radiology studies and my findings are as above.  VENTILATOR SETTINGS:    Cultures   Antimicrobials: Anti-infectives (From admission, onward)   None       Devices    LINES / TUBES:      Continuous Infusions:    Objective: Vitals:   01/05/20 1012 01/05/20 1242 01/05/20 1243 01/05/20 1422  BP: (!) 165/96 (!) 146/84  (!) 195/94  Pulse:   77   Resp: 16 13    Temp:   98.7 F (37.1 C)   TempSrc:   Oral   SpO2:   99%   Weight:      Height:        Intake/Output Summary (Last 24 hours) at 01/05/2020 1839 Last data filed at 01/05/2020 1829 Gross per 24 hour  Intake 1301.21 ml  Output 1400 ml  Net -98.79 ml   Filed Weights   01/03/20 0529 01/04/20 0411 01/05/20 0456  Weight: 40.4 kg 42.8 kg 44.8 kg   Physical Exam:  General: A/O x4, mild dementia (short-term memory loss), no acute respiratory distress, cachectic Eyes: negative scleral  hemorrhage, negative anisocoria, negative icterus ENT: Negative Runny nose, negative gingival bleeding, Neck:  Negative scars, masses, torticollis, lymphadenopathy, JVD Lungs: Clear to auscultation bilaterally without wheezes or crackles Cardiovascular: Regular rate and rhythm without murmur gallop or rub normal S1 and S2, pain to palpation left lateral ribs Abdomen: Positive abdominal pain LUQ/LLQ, nondistended, positive soft, bowel sounds, no rebound, no ascites, no appreciable mass Extremities: No significant cyanosis, clubbing, or edema bilateral lower extremities Skin: Negative rashes, lesions, ulcers Psychiatric:  Negative depression, negative anxiety, negative fatigue, negative mania  Central nervous system:  Cranial nerves II through XII intact, tongue/uvula midline, all extremities muscle strength 5/5, sensation intact throughout,negative dysarthria, negative expressive aphasia, negative receptive aphasia.  .     Data Reviewed: Care during the described time interval was provided by me .  I have reviewed this patient's available data, including medical history, events of note, physical examination, and all test results as part of my evaluation.  CBC: Recent Labs  Lab 01/02/20 1246 01/02/20 2328 01/03/20 0207 01/04/20 1002 01/05/20 0238  WBC 7.7 9.3 8.4 4.5 4.9  NEUTROABS  --   --   --  2.7  --   HGB 10.0* 10.7* 10.3* 9.1* 8.0*  HCT 32.2* 34.6* 31.7* 26.8* 23.5*  MCV 94.7 97.7 92.7 89.9  89.0  PLT 342 309 288 302 950   Basic Metabolic Panel: Recent Labs  Lab 01/02/20 1246 01/02/20 1246 01/02/20 2328 01/03/20 0602 01/04/20 0233 01/04/20 1002 01/04/20 1641 01/05/20 0238  NA 138  --   --  142 139  --  140 140  K 6.0*  --   --  4.7 3.9  --  3.6 3.8  CL 117*  --   --  121* 117*  --  114* 110  CO2 9*  --   --  8* 12*  --  15* 18*  GLUCOSE 187*  --   --  108* 104*  --  135* 104*  BUN 55*  --   --  54* 48*  --  43* 42*  CREATININE 2.82*   < > 2.45* 2.37* 2.11*  --   1.86* 1.86*  CALCIUM 9.8  --   --  9.3 8.5*  --  8.8* 8.5*  MG  --   --   --   --   --  1.8 1.8 1.8  PHOS  --   --   --   --   --  2.8  --  2.4*   < > = values in this interval not displayed.   GFR: Estimated Creatinine Clearance: 18.2 mL/min (A) (by C-G formula based on SCr of 1.86 mg/dL (H)). Liver Function Tests: Recent Labs  Lab 01/03/20 0602 01/05/20 0238  AST 22 19  ALT 14 11  ALKPHOS 61 55  BILITOT 0.7 0.5  PROT 6.3* 5.6*  ALBUMIN 3.7 3.0*   No results for input(s): LIPASE, AMYLASE in the last 168 hours. No results for input(s): AMMONIA in the last 168 hours. Coagulation Profile: No results for input(s): INR, PROTIME in the last 168 hours. Cardiac Enzymes: Recent Labs  Lab 01/02/20 1430 01/04/20 0233  CKTOTAL 1,075* 485*   BNP (last 3 results) No results for input(s): PROBNP in the last 8760 hours. HbA1C: No results for input(s): HGBA1C in the last 72 hours. CBG: No results for input(s): GLUCAP in the last 168 hours. Lipid Profile: No results for input(s): CHOL, HDL, LDLCALC, TRIG, CHOLHDL, LDLDIRECT in the last 72 hours. Thyroid Function Tests: No results for input(s): TSH, T4TOTAL, FREET4, T3FREE, THYROIDAB in the last 72 hours. Anemia Panel: Recent Labs    01/04/20 1002  VITAMINB12 506  FOLATE 35.3  FERRITIN 428*  TIBC 234*  IRON 57  RETICCTPCT 2.4   Sepsis Labs: No results for input(s): PROCALCITON, LATICACIDVEN in the last 168 hours.  Recent Results (from the past 240 hour(s))  SARS CORONAVIRUS 2 (TAT 6-24 HRS) Nasopharyngeal Nasopharyngeal Swab     Status: None   Collection Time: 01/02/20  4:49 PM   Specimen: Nasopharyngeal Swab  Result Value Ref Range Status   SARS Coronavirus 2 NEGATIVE NEGATIVE Final    Comment: (NOTE) SARS-CoV-2 target nucleic acids are NOT DETECTED. The SARS-CoV-2 RNA is generally detectable in upper and lower respiratory specimens during the acute phase of infection. Negative results do not preclude SARS-CoV-2  infection, do not rule out co-infections with other pathogens, and should not be used as the sole basis for treatment or other patient management decisions. Negative results must be combined with clinical observations, patient history, and epidemiological information. The expected result is Negative. Fact Sheet for Patients: SugarRoll.be Fact Sheet for Healthcare Providers: https://www.-mathews.com/ This test is not yet approved or cleared by the [place] FDA and  has been authorized for detection and/or diagnosis of SARS-CoV-2 by FDA  under an Emergency Use Authorization (EUA). This EUA will remain  in effect (meaning this test can be used) for the duration of the COVID-19 declaration under Section 56 4(b)(1) of the Act, 21 U.S.C. section 360bbb-3(b)(1), unless the authorization is terminated or revoked sooner. Performed at [facility], [place] [address], [place], [place] [address]          Radiology Studies: DG Ribs Bilateral W/Chest  Result Date: 01/05/2020 CLINICAL DATA:  Status post fall and subsequent left rib pain. EXAM: BILATERAL RIBS AND CHEST - 4+ VIEW COMPARISON:  January 02, 2020 FINDINGS: No acute fracture or other bone lesions are seen involving the ribs. A chronic sixth left rib fracture is noted. A chronic deformity of the first left rib is also seen. Bilateral radiopaque pedicle screws are seen within the lower lumbar spine. There is no evidence of pneumothorax or pleural effusion. Both lungs are clear. Heart size and mediastinal contours are within normal limits. IMPRESSION: Chronic first and sixth left rib deformities, without evidence of acute osseous abnormality. Electronically Signed   By: Virgina Norfolk M.D.   On: 01/05/2020 15:30        Scheduled Meds: . aspirin EC  81 mg Oral Daily  . citalopram  20 mg Oral Daily  . clopidogrel  75 mg Oral Daily  . donepezil  5 mg Oral Daily  . DULoxetine  30 mg  Oral Daily  . feeding supplement (ENSURE ENLIVE)  237 mL Oral TID BM  . ferrous sulfate  325 mg Oral Q breakfast  . gabapentin  100 mg Oral TID  . heparin  5,000 Units Subcutaneous Q8H  . isosorbide-hydrALAZINE  2 tablet Oral TID  . metoprolol tartrate  50 mg Oral BID  . multivitamin with minerals  1 tablet Oral Daily  . pantoprazole  40 mg Oral QAC breakfast  . rosuvastatin  40 mg Oral Daily  . sodium bicarbonate  1,300 mg Oral BID  . sodium chloride flush  3 mL Intravenous Once   Continuous Infusions:    LOS: 2 days    Time spent:40 min    Kahealani Yankovich, Geraldo Docker, MD [facility] Pager [phone]  If 7PM-7AM, please contact night-coverage [email or web address] Password [facility] 01/05/2020, 6:39 PM

## 2020-01-05 NOTE — Progress Notes (Signed)
[facility] Progress Note    Assessment/ Plan:   1.  AKI on CKD: appears to be hemodynamically mediated and better with hydration.  Agree with continuing to check orthostatics and encouraging PO intake.  HCTZ on OP medlist- would not restart for now.  Her baseline Cr is 1.9 and she's at baseline now with improving parameters  Will make sure she has followup with Dr Joelyn Oms, have requested appt  Solitary kidney  2.  Metabolic acidosis-- have added sodium bicarb 1300 BID, can transition off gtt now that improved a lot  3.  Syncope- EKG and trops OK, VQ low probability, possibly orthostatic  4.  LLQ pain: ? Constipation, per primary  5.  DIspo: will sign off,  Call with questions.   Subjective:    Seen in room. Back to baseline Cr and acidosis better.  Reports L sided abd pain   Objective:   BP (!) 165/96   Pulse 84   Temp 99.3 F (37.4 C) (Oral)   Resp 16   Ht 5\' 3"  (1.6 m)   Wt 44.8 kg   SpO2 96%   BMI 17.50 kg/m   Intake/Output Summary (Last 24 hours) at 01/05/2020 1114 Last data filed at 01/05/2020 0900 Gross per 24 hour  Intake 28.38 ml  Output 1200 ml  Net -1171.62 ml   Weight change: 2 kg  Physical Exam: GEN nad, sitting in bed HEENT EOMI PERRL NECK no JVD PULM clear bilaterally CV RRR ABD soft, mildly tender LLQ EXT no LE edema NEURO able to have a conversation SKIN no rashes MSK no effusions  Imaging: No results found.  Labs: BMET Recent Labs  Lab 01/02/20 1246 01/02/20 2328 01/03/20 0602 01/04/20 0233 01/04/20 1002 01/04/20 1641 01/05/20 0238  NA 138  --  142 139  --  140 140  K 6.0*  --  4.7 3.9  --  3.6 3.8  CL 117*  --  121* 117*  --  114* 110  CO2 9*  --  8* 12*  --  15* 18*  GLUCOSE 187*  --  108* 104*  --  135* 104*  BUN 55*  --  54* 48*  --  43* 42*  CREATININE 2.82* 2.45* 2.37* 2.11*  --  1.86* 1.86*  CALCIUM 9.8  --  9.3 8.5*  --  8.8* 8.5*  PHOS  --   --   --   --  2.8  --  2.4*   CBC Recent Labs  Lab  01/02/20 2328 01/03/20 0207 01/04/20 1002 01/05/20 0238  WBC 9.3 8.4 4.5 4.9  NEUTROABS  --   --  2.7  --   HGB 10.7* 10.3* 9.1* 8.0*  HCT 34.6* 31.7* 26.8* 23.5*  MCV 97.7 92.7 89.9 89.0  PLT 309 288 302 294    Medications:    . aspirin EC  81 mg Oral Daily  . citalopram  20 mg Oral Daily  . clopidogrel  75 mg Oral Daily  . donepezil  5 mg Oral Daily  . DULoxetine  30 mg Oral Daily  . feeding supplement (ENSURE ENLIVE)  237 mL Oral TID BM  . ferrous sulfate  325 mg Oral Q breakfast  . gabapentin  100 mg Oral TID  . heparin  5,000 Units Subcutaneous Q8H  . isosorbide-hydrALAZINE  2 tablet Oral TID  . metoprolol tartrate  50 mg Oral BID  . multivitamin with minerals  1 tablet Oral Daily  . pantoprazole  40 mg Oral QAC  breakfast  . rosuvastatin  40 mg Oral Daily  . sodium bicarbonate  1,300 mg Oral BID  . sodium chloride flush  3 mL Intravenous Once      Madelon Lips, MD 01/05/2020, 11:14 AM

## 2020-01-06 DIAGNOSIS — Z515 Encounter for palliative care: Secondary | ICD-10-CM

## 2020-01-06 DIAGNOSIS — R0781 Pleurodynia: Secondary | ICD-10-CM

## 2020-01-06 DIAGNOSIS — N179 Acute kidney failure, unspecified: Principal | ICD-10-CM

## 2020-01-06 LAB — COMPREHENSIVE METABOLIC PANEL
ALT: 12 U/L (ref 0–44)
AST: 23 U/L (ref 15–41)
Albumin: 3.1 g/dL — ABNORMAL LOW (ref 3.5–5.0)
Alkaline Phosphatase: 53 U/L (ref 38–126)
Anion gap: 13 (ref 5–15)
BUN: 42 mg/dL — ABNORMAL HIGH (ref 8–23)
CO2: 19 mmol/L — ABNORMAL LOW (ref 22–32)
Calcium: 8.8 mg/dL — ABNORMAL LOW (ref 8.9–10.3)
Chloride: 107 mmol/L (ref 98–111)
Creatinine, Ser: 1.69 mg/dL — ABNORMAL HIGH (ref 0.44–1.00)
GFR calc Af Amer: 34 mL/min — ABNORMAL LOW (ref >60)
GFR calc non Af Amer: 29 mL/min — ABNORMAL LOW (ref >60)
Glucose, Bld: 128 mg/dL — ABNORMAL HIGH (ref 70–99)
Potassium: 3.9 mmol/L (ref 3.5–5.1)
Sodium: 139 mmol/L (ref 135–145)
Total Bilirubin: 0.4 mg/dL (ref 0.3–1.2)
Total Protein: 5.7 g/dL — ABNORMAL LOW (ref 6.5–8.1)

## 2020-01-06 LAB — CBC
HCT: 25.1 % — ABNORMAL LOW (ref 36.0–46.0)
Hemoglobin: 8.7 g/dL — ABNORMAL LOW (ref 12.0–15.0)
MCH: 30.4 pg (ref 26.0–34.0)
MCHC: 34.7 g/dL (ref 30.0–36.0)
MCV: 87.8 fL (ref 80.0–100.0)
Platelets: 303 10*3/uL (ref 150–400)
RBC: 2.86 MIL/uL — ABNORMAL LOW (ref 3.87–5.11)
RDW: 14.9 % (ref 11.5–15.5)
WBC: 6.8 10*3/uL (ref 4.0–10.5)
nRBC: 0 % (ref 0.0–0.2)

## 2020-01-06 LAB — PHOSPHORUS: Phosphorus: 2.1 mg/dL — ABNORMAL LOW (ref 2.5–4.6)

## 2020-01-06 LAB — MAGNESIUM: Magnesium: 1.9 mg/dL (ref 1.7–2.4)

## 2020-01-06 MED ORDER — METOPROLOL TARTRATE 50 MG PO TABS
75.0000 mg | ORAL_TABLET | Freq: Two times a day (BID) | ORAL | Status: DC
  Administered 2020-01-06 – 2020-01-07 (×2): 75 mg via ORAL
  Filled 2020-01-06 (×2): qty 1

## 2020-01-06 MED ORDER — METOPROLOL TARTRATE 25 MG PO TABS
25.0000 mg | ORAL_TABLET | Freq: Once | ORAL | Status: AC
  Administered 2020-01-06: 25 mg via ORAL
  Filled 2020-01-06: qty 1

## 2020-01-06 NOTE — Progress Notes (Signed)
PROGRESS NOTE    Alyssa [name]  BWG:[id number] DOB: December 25, 1940 DOA: 01/02/2020 PCP: Loretha Brasil, FNP     Brief Narrative:  79 y.o. BF PMHx Anxiety, Dementia CKD stage IV, solitary kidney, PVD, HTN, HLD, anemia, Subarachnoid hemorrhage managed conservatively in December 2020, antiplatelet therapy was restarted on September 23, 2019,Peripheral neuropathy  Brought to the hospital after patient was found on the floor.  She complained of severe shortness of breath and chest pressure/mild chest pain for past 3 days.  There is questionable history of syncope as patient says that she passed out but as per son she was on lying on the floor but did not pass out.  In the ED troponin x2 were unremarkable.  She was found to have elevated D-dimer, CTA chest could not be obtained as patient has worsening renal function.  She denies nausea vomiting or diarrhea. Denies abdominal pain or dysuria. No previous history of stroke or seizures. She was found to have elevated blood pressure 186/99. 10 mg IV hydralazine x1 was given in the ED.    Subjective: 3/21 A/O x4, states LLQ abdominal pain has significantly decreased since having enema last night.  Negative CP, negative SOB.  Assessment & Plan:   Principal Problem:   AKI (acute kidney injury) ([place]) Active Problems:   PVD (peripheral vascular disease) with claudication ([place])   Gait difficulty   Syncope   Essential hypertension   Hyperlipidemia   CKD (chronic kidney disease) stage 4, GFR 15-29 ml/min (HCC)   Depression with anxiety   SAH (subarachnoid hemorrhage) (HCC)   Dementia (HCC)   Hyperkalemia   Severe protein-calorie malnutrition ([place])   Acute on chronic renal failure (HCC)   Constipation   Acute on CKD stage IV/ non anion gap metabolic acidosis.  (Best Baseline Cr 1.61 on 11/04/2019) -3/20 DC per nephrology sodium bicarbonate plus D5W at 63ml/hr* -3/20 per nephrology Dr. Rodman Key start sodium bicarbonate tablets 1300 mg  BID Recent Labs  Lab 01/03/20 0602 01/04/20 0233 01/04/20 1641 01/05/20 0238 01/06/20 0316  CREATININE 2.37* 2.11* 1.86* 1.86* 1.69*  -Strict in and out +2.4 L -Daily weight Filed Weights   01/04/20 0411 01/05/20 0456 01/06/20 0333  Weight: 42.8 kg 44.8 kg 43.9 kg  -3/20 patient to follow-up with Dr. Joelyn Oms as outpatient. Nephrology will sign off   Essential HTN -3/20 increase isosorbide-hydralazine 20-37.5 mg 2 tablets TID  -3/21 increase Metoprolol 75 mg BID -3/20 Hydralazine PRN  Syncope -Multifactorial acute on CKD stage IV, dehydration, anorexia, overall poor condition.  -Daily orthostatic vitals; 3/20 not orthostatic -V/Q scan low probability for pulmonary embolism. Follow up on echocardiogram.    PVD -ASA -Crestor 40 mg daily -Clopidogrel 75 mg daily  Dementia.  -Donezepil 5 mg daily -Buspirone 15 mg TID -Duloxetine 30 mg daily -Temazepam 15 mg PRN -Citalopram 20 mg daily  Anemia of chronic disease -Continue iron supplements.  -Anemia panel findings most consistent with anemia of chronic disease.  Severe protein calorie malnutrition -3/19 nutrition consult; diet effective currently.  Left abdominal pain -Most likely secondary to diverticula and moderate stool burden (constipation) -SMOG enema  Rib fractures -Chronic left rib fractures see results below -Patient aware that given her age and debilitated state fractures will most likely never heal.  Pain management  Physical debilitation -Out of bed to chair q shift ambulate as tolerated  Goals of care -3/19 palliative care; evaluate for palliative care vs change of code to DNR vs hospice     DVT prophylaxis: Heparin  subcu Code Status: Full Family Communication: 3/20 son at bedside discussed plan of care answered all questions Disposition Plan: Awaiting PT/OT recommendation.  Awaiting nephrology recommendation. 1.  Where the patient is from; home 2.  Anticipated d/c place. 3.  Barriers to  d/c 3/20 consult placed to LCSW for SNF placement     Consultants:  nephrology Dr. Rodman Key    Procedures/Significant Events:  3/17 CXR; negative acute disease.-Moderate hiatal hernia 3/17 CT head Wo contrast; negative acute intracranial abnormality 3/17 CT C-spine W. Wo contrast;significant degenerative changes throughout the cervical spine with marked loss of disc height at C3-C4,-moderate loss of disc height C5-C6 and C6-C7 and marked loss of discheight C7-T1. There are bilateral facet degenerative changes. No convincing disc herniation. 3/17 CT chest W. Wo contrast;Moderate emphysema. -Right renal atrophy. -Colonic diverticula and moderate increased colonic stool burden.No bowel inflammation. -Moderate hiatal hernia. - Diffuse aortic atherosclerosis. -No acute skeletal abnormality. Significant thoracolumbar degenerative changes. Bilateral shoulder arthropathic changes suggesting an inflammatory arthropathy such as rheumatoid arthritis. 3/17 CT abdomen pelvis W. Wo contrast;-no acute findings within the chest, abdomen or pelvis. -Moderate emphysema. 3/20 DG ribs bilateral; -Positive chronic first and sixth rib fracture    I have personally reviewed and interpreted all radiology studies and my findings are as above.  VENTILATOR SETTINGS:    Cultures   Antimicrobials: Anti-infectives (From admission, onward)   None       Devices    LINES / TUBES:      Continuous Infusions:    Objective: Vitals:   01/06/20 0033 01/06/20 0333 01/06/20 0823 01/06/20 0831  BP: (!) 149/85 (!) 177/92 (!) 197/99   Pulse: 80 76 81 80  Resp: 15 (!) 22    Temp: 98.2 F (36.8 C) 97.8 F (36.6 C)  98.6 F (37 C)  TempSrc: Oral Oral  Oral  SpO2: 99% 99%  99%  Weight:  43.9 kg    Height:        Intake/Output Summary (Last 24 hours) at 01/06/2020 1002 Last data filed at 01/05/2020 1829 Gross per 24 hour  Intake 1272.83 ml  Output 200 ml  Net 1072.83 ml   Filed Weights    01/04/20 0411 01/05/20 0456 01/06/20 0333  Weight: 42.8 kg 44.8 kg 43.9 kg   Physical Exam:  General: A/O x4, mild dementia (some short-term memory loss), no acute respiratory distress, cachectic Eyes: negative scleral hemorrhage, negative anisocoria, negative icterus ENT: Negative Runny nose, negative gingival bleeding, Neck:  Negative scars, masses, torticollis, lymphadenopathy, JVD Lungs: Clear to auscultation bilaterally without wheezes or crackles Cardiovascular: Regular rate and rhythm without murmur gallop or rub normal S1 and S2 Abdomen: negative abdominal pain, nondistended, positive soft, bowel sounds, no rebound, no ascites, no appreciable mass Extremities: No significant cyanosis, clubbing, or edema bilateral lower extremities Skin: Negative rashes, lesions, ulcers Psychiatric:  Negative depression, negative anxiety, negative fatigue, negative mania  Central nervous system:  Cranial nerves II through XII intact, tongue/uvula midline, all extremities muscle strength 5/5, sensation intact throughout, negative dysarthria, negative expressive aphasia, negative receptive aphasia. .     Data Reviewed: Care during the described time interval was provided by me .  I have reviewed this patient's available data, including medical history, events of note, physical examination, and all test results as part of my evaluation.  CBC: Recent Labs  Lab 01/02/20 2328 01/03/20 0207 01/04/20 1002 01/05/20 0238 01/06/20 0316  WBC 9.3 8.4 4.5 4.9 6.8  NEUTROABS  --   --  2.7  --   --  HGB 10.7* 10.3* 9.1* 8.0* 8.7*  HCT 34.6* 31.7* 26.8* 23.5* 25.1*  MCV 97.7 92.7 89.9 89.0 87.8  PLT 309 288 302 294 798   Basic Metabolic Panel: Recent Labs  Lab 01/03/20 0602 01/04/20 0233 01/04/20 1002 01/04/20 1641 01/05/20 0238 01/06/20 0316  NA 142 139  --  140 140 139  K 4.7 3.9  --  3.6 3.8 3.9  CL 121* 117*  --  114* 110 107  CO2 8* 12*  --  15* 18* 19*  GLUCOSE 108* 104*  --  135* 104*  128*  BUN 54* 48*  --  43* 42* 42*  CREATININE 2.37* 2.11*  --  1.86* 1.86* 1.69*  CALCIUM 9.3 8.5*  --  8.8* 8.5* 8.8*  MG  --   --  1.8 1.8 1.8 1.9  PHOS  --   --  2.8  --  2.4* 2.1*   GFR: Estimated Creatinine Clearance: 19.6 mL/min (A) (by C-G formula based on SCr of 1.69 mg/dL (H)). Liver Function Tests: Recent Labs  Lab 01/03/20 0602 01/05/20 0238 01/06/20 0316  AST 22 19 23   ALT 14 11 12   ALKPHOS 61 55 53  BILITOT 0.7 0.5 0.4  PROT 6.3* 5.6* 5.7*  ALBUMIN 3.7 3.0* 3.1*   No results for input(s): LIPASE, AMYLASE in the last 168 hours. No results for input(s): AMMONIA in the last 168 hours. Coagulation Profile: No results for input(s): INR, PROTIME in the last 168 hours. Cardiac Enzymes: Recent Labs  Lab 01/02/20 1430 01/04/20 0233  CKTOTAL 1,075* 485*   BNP (last 3 results) No results for input(s): PROBNP in the last 8760 hours. HbA1C: No results for input(s): HGBA1C in the last 72 hours. CBG: No results for input(s): GLUCAP in the last 168 hours. Lipid Profile: No results for input(s): CHOL, HDL, LDLCALC, TRIG, CHOLHDL, LDLDIRECT in the last 72 hours. Thyroid Function Tests: No results for input(s): TSH, T4TOTAL, FREET4, T3FREE, THYROIDAB in the last 72 hours. Anemia Panel: Recent Labs    01/04/20 1002  VITAMINB12 506  FOLATE 35.3  FERRITIN 428*  TIBC 234*  IRON 57  RETICCTPCT 2.4   Sepsis Labs: No results for input(s): PROCALCITON, LATICACIDVEN in the last 168 hours.  Recent Results (from the past 240 hour(s))  SARS CORONAVIRUS 2 (TAT 6-24 HRS) Nasopharyngeal Nasopharyngeal Swab     Status: None   Collection Time: 01/02/20  4:49 PM   Specimen: Nasopharyngeal Swab  Result Value Ref Range Status   SARS Coronavirus 2 NEGATIVE NEGATIVE Final    Comment: (NOTE) SARS-CoV-2 target nucleic acids are NOT DETECTED. The SARS-CoV-2 RNA is generally detectable in upper and lower respiratory specimens during the acute phase of infection. Negative results  do not preclude SARS-CoV-2 infection, do not rule out co-infections with other pathogens, and should not be used as the sole basis for treatment or other patient management decisions. Negative results must be combined with clinical observations, patient history, and epidemiological information. The expected result is Negative. Fact Sheet for Patients: SugarRoll.be Fact Sheet for Healthcare Providers: https://www.-mathews.com/ This test is not yet approved or cleared by the [place] FDA and  has been authorized for detection and/or diagnosis of SARS-CoV-2 by FDA under an Emergency Use Authorization (EUA). This EUA will remain  in effect (meaning this test can be used) for the duration of the COVID-19 declaration under Section 56 4(b)(1) of the Act, 21 U.S.C. section 360bbb-3(b)(1), unless the authorization is terminated or revoked sooner. Performed at [facility],  [address] [place], [place] [address]          Radiology Studies: DG Ribs Bilateral W/Chest  Result Date: 01/05/2020 CLINICAL DATA:  Status post fall and subsequent left rib pain. EXAM: BILATERAL RIBS AND CHEST - 4+ VIEW COMPARISON:  January 02, 2020 FINDINGS: No acute fracture or other bone lesions are seen involving the ribs. A chronic sixth left rib fracture is noted. A chronic deformity of the first left rib is also seen. Bilateral radiopaque pedicle screws are seen within the lower lumbar spine. There is no evidence of pneumothorax or pleural effusion. Both lungs are clear. Heart size and mediastinal contours are within normal limits. IMPRESSION: Chronic first and sixth left rib deformities, without evidence of acute osseous abnormality. Electronically Signed   By: Virgina Norfolk M.D.   On: 01/05/2020 15:30        Scheduled Meds: . aspirin EC  81 mg Oral Daily  . citalopram  20 mg Oral Daily  . clopidogrel  75 mg Oral Daily  . donepezil  5 mg Oral  Daily  . DULoxetine  30 mg Oral Daily  . feeding supplement (ENSURE ENLIVE)  237 mL Oral TID BM  . ferrous sulfate  325 mg Oral Q breakfast  . gabapentin  100 mg Oral TID  . heparin  5,000 Units Subcutaneous Q8H  . isosorbide-hydrALAZINE  2 tablet Oral TID  . metoprolol tartrate  50 mg Oral BID  . multivitamin with minerals  1 tablet Oral Daily  . pantoprazole  40 mg Oral QAC breakfast  . rosuvastatin  40 mg Oral Daily  . sodium bicarbonate  1,300 mg Oral BID  . sodium chloride flush  3 mL Intravenous Once   Continuous Infusions:    LOS: 3 days    Time spent:40 min    Debarah Mccumbers, Geraldo Docker, MD [facility] Pager [phone]  If 7PM-7AM, please contact night-coverage [email or web address] Password [facility] 01/06/2020, 10:02 AM

## 2020-01-06 NOTE — Consult Note (Signed)
Palliative Medicine  Name: Alyssa [name] Date: 01/06/2020 MRN: [id number]  DOB: 01-09-1941  Patient Care Team: Loretha Brasil, FNP as PCP - General (Family Medicine) Kary Kos, MD as Consulting Physician (Neurosurgery)    REASON FOR CONSULTATION: Alyssa [name] is a 79 y.o. female with multiple medical problems including CKD stage IV with solitary kidney, PVD, hypertension, anemia, early dementia, history of SAH managed conservatively, who was most recently hospitalized 11/01/2019-11/04/2019 with pneumonia.  Patient is now admitted 01/02/2020 with questionable syncope after being found on the floor at home.  Work-up revealed acute on chronic kidney disease with metabolic acidosis.  Palliative care was consulted to help address goals and clarify CODE STATUS.  SOCIAL HISTORY:     reports that she has been smoking cigarettes. She has a 27.00 pack-year smoking history. She has never used smokeless tobacco. She reports that she does not drink alcohol or use drugs.   Patient is widowed.  She lives at home with a friend.  She has 2 sons who are involved in her care.  Patient previously worked in [employer].  ADVANCE DIRECTIVES:  Not on file but patient reports that her [name] is her [facility] POA  CODE STATUS: Full code  PAST MEDICAL HISTORY: Past Medical History:  Diagnosis Date  . Acute respiratory failure with hypoxia ([place]) 11/01/2019  . Anxiety   . Arthritis   . Chest pain   . Chronic kidney disease   . Dyspnea   . Elevated troponin 12/13/2018  . Headache(784.0)   . Hyperlipidemia   . Hypertension   . Joint pain   . Leg pain   . Peripheral neuropathy   . Peripheral vascular disease ([place])     PAST SURGICAL HISTORY:  Past Surgical History:  Procedure Laterality Date  . ABDOMINAL ANGIOGRAM N/A 10/29/2011   Procedure: ABDOMINAL ANGIOGRAM;  Surgeon: Elam Dutch, MD;  Location: [facility] CATH LAB;  Service: Cardiovascular;  Laterality: N/A;  . BACK SURGERY    .  FEMORAL-FEMORAL BYPASS GRAFT  08/31/2010  . FLEXIBLE SIGMOIDOSCOPY N/A 06/12/2019   Procedure: FLEXIBLE SIGMOIDOSCOPY;  Surgeon: Ladene Artist, MD;  Location[facility] ENDOSCOPY;  Service: Endoscopy;  Laterality: N/A;  Patient had little bit to eat this morning so this will be unsedated.  Marland Kitchen JOINT REPLACEMENT Right 2012   Hip  . LOWER EXTREMITY ANGIOGRAPHY  06/14/2017   Procedure: Lower Extremity Angiography;  Surgeon: Adrian Prows, MD;  Location: [place] CV LAB;  Service: Cardiovascular;;  Bilateral limited angio performed  . RENAL ANGIOGRAPHY N/A 06/14/2017   Procedure: Renal Angiography;  Surgeon: Adrian Prows, MD;  Location: [place] CV LAB;  Service: Cardiovascular;  Laterality: N/A;  . TOTAL HIP ARTHROPLASTY     right    HEMATOLOGY/ONCOLOGY HISTORY:  Oncology History   No history exists.    ALLERGIES:  has No Known Allergies.  MEDICATIONS:  Current Facility-Administered Medications  Medication Dose Route Frequency Provider Last Rate Last Admin  . acetaminophen (TYLENOL) tablet 650 mg  650 mg Oral Q6H PRN Oswald Hillock, MD   650 mg at 01/06/20 1056  . aspirin EC tablet 81 mg  81 mg Oral Daily Oswald Hillock, MD   81 mg at 01/06/20 0824  . busPIRone (BUSPAR) tablet 15 mg  15 mg Oral TID PRN Oswald Hillock, MD   15 mg at 01/04/20 1055  . citalopram (CELEXA) tablet 20 mg  20 mg Oral Daily Darrick Meigs, Marge Duncans, MD   20 mg at  01/06/20 2353  . clopidogrel (PLAVIX) tablet 75 mg  75 mg Oral Daily Oswald Hillock, MD   75 mg at 01/06/20 6144  . donepezil (ARICEPT) tablet 5 mg  5 mg Oral Daily Oswald Hillock, MD   5 mg at 01/06/20 0824  . DULoxetine (CYMBALTA) DR capsule 30 mg  30 mg Oral Daily Oswald Hillock, MD   30 mg at 01/06/20 0824  . feeding supplement (ENSURE ENLIVE) (ENSURE ENLIVE) liquid 237 mL  237 mL Oral TID BM Arrien, Jimmy Picket, MD   237 mL at 01/05/20 2033  . ferrous sulfate tablet 325 mg  325 mg Oral Q breakfast Oswald Hillock, MD   325 mg at 01/06/20 0824  . gabapentin (NEURONTIN)  capsule 100 mg  100 mg Oral TID Oswald Hillock, MD   100 mg at 01/06/20 0824  . heparin injection 5,000 Units  5,000 Units Subcutaneous Q8H Oswald Hillock, MD   5,000 Units at 01/06/20 [phone]  . hydrALAZINE (APRESOLINE) injection 10 mg  10 mg Intravenous Q4H PRN Allie Bossier, MD   10 mg at 01/06/20 1056  . isosorbide-hydrALAZINE (BIDIL) 20-37.5 MG per tablet 2 tablet  2 tablet Oral TID Allie Bossier, MD   2 tablet at 01/06/20 [phone]  . metoprolol tartrate (LOPRESSOR) tablet 25 mg  25 mg Oral Once Allie Bossier, MD      . metoprolol tartrate (LOPRESSOR) tablet 75 mg  75 mg Oral BID Allie Bossier, MD      . multivitamin with minerals tablet 1 tablet  1 tablet Oral Daily Arrien, Jimmy Picket, MD   1 tablet at 01/06/20 [phone]  . nitroGLYCERIN (NITROSTAT) SL tablet 0.4 mg  0.4 mg Sublingual Q5 min PRN Oswald Hillock, MD   0.4 mg at 01/02/20 2340  . ondansetron (ZOFRAN) tablet 4 mg  4 mg Oral Q6H PRN Oswald Hillock, MD       Or  . ondansetron (ZOFRAN) injection 4 mg  4 mg Intravenous Q6H PRN Oswald Hillock, MD      . pantoprazole (PROTONIX) EC tablet 40 mg  40 mg Oral QAC breakfast Oswald Hillock, MD   40 mg at 01/06/20 0824  . rosuvastatin (CRESTOR) tablet 40 mg  40 mg Oral Daily Oswald Hillock, MD   40 mg at 01/06/20 5093  . sodium bicarbonate tablet 1,300 mg  1,300 mg Oral BID Madelon Lips, MD   1,300 mg at 01/06/20 2671  . sodium chloride flush (NS) 0.9 % injection 3 mL  3 mL Intravenous Once [place], Gagan S, MD      . temazepam (RESTORIL) capsule 15 mg  15 mg Oral Daily PRN Oswald Hillock, MD        VITAL SIGNS: BP (!) 158/87   Pulse 80   Temp 98.6 F (37 C) (Oral)   Resp 13   Ht 5\' 3"  (1.6 m)   Wt 96 lb 11.2 oz (43.9 kg)   SpO2 99%   BMI 17.13 kg/m  Filed Weights   01/04/20 0411 01/05/20 0456 01/06/20 0333  Weight: 94 lb 5.7 oz (42.8 kg) 98 lb 12.3 oz (44.8 kg) 96 lb 11.2 oz (43.9 kg)    Estimated body mass index is 17.13 kg/m as calculated from the following:   Height as of this  encounter: 5\' 3"  (1.6 m).   Weight as of this encounter: 96 lb 11.2 oz (43.9 kg).  LABS: CBC:    Component Value  Date/Time   WBC 6.8 01/06/2020 0316   HGB 8.7 (L) 01/06/2020 0316   HCT 25.1 (L) 01/06/2020 0316   PLT 303 01/06/2020 0316   MCV 87.8 01/06/2020 0316   NEUTROABS 2.7 01/04/2020 1002   LYMPHSABS 1.0 01/04/2020 1002   MONOABS 0.4 01/04/2020 1002   EOSABS 0.3 01/04/2020 1002   BASOSABS 0.1 01/04/2020 1002   Comprehensive Metabolic Panel:    Component Value Date/Time   NA 139 01/06/2020 0316   K 3.9 01/06/2020 0316   CL 107 01/06/2020 0316   CO2 19 (L) 01/06/2020 0316   BUN 42 (H) 01/06/2020 0316   CREATININE 1.69 (H) 01/06/2020 0316   GLUCOSE 128 (H) 01/06/2020 0316   CALCIUM 8.8 (L) 01/06/2020 0316   AST 23 01/06/2020 0316   ALT 12 01/06/2020 0316   ALKPHOS 53 01/06/2020 0316   BILITOT 0.4 01/06/2020 0316   PROT 5.7 (L) 01/06/2020 0316   ALBUMIN 3.1 (L) 01/06/2020 0316    RADIOGRAPHIC STUDIES: CT ABDOMEN PELVIS WO CONTRAST  Result Date: 01/02/2020 CLINICAL DATA:  Syncope. Back pain. Patient cannot tolerate intravenous contrast. EXAM: CT CHEST, ABDOMEN AND PELVIS WITHOUT CONTRAST TECHNIQUE: Multidetector CT imaging of the chest, abdomen and pelvis was performed following the standard protocol without IV contrast. COMPARISON:  None. FINDINGS: CT CHEST FINDINGS Cardiovascular: Heart normal in size. Three-vessel coronary artery calcifications. No pericardial effusion. Aortic atherosclerotic calcifications. No displacement of calcifications away from the aortic wall to suggest a dissection. Atherosclerotic calcifications extend into the aortic arch branch vessels. Mediastinum/Nodes: No neck base, mediastinal or hilar masses or enlarged lymph nodes. Trachea and esophagus are unremarkable. Moderate hiatal hernia. Lungs/Pleura: Minor dependent lung base atelectasis. Moderate centrilobular emphysema. No evidence of pneumonia or pulmonary edema. No mass or suspicious nodule. No  pleural effusion or pneumothorax. Musculoskeletal: No fracture or acute finding. No aggressive bone lesion. Significant degenerative changes throughout the thoracic spine. Arthropathic changes of both shoulders, a pattern consistent with an inflammatory arthropathy. CT ABDOMEN PELVIS FINDINGS Hepatobiliary: No focal liver abnormality is seen. No gallstones, gallbladder wall thickening, or biliary dilatation. Pancreas: Unremarkable. No pancreatic ductal dilatation or surrounding inflammatory changes. Spleen: Normal in size without focal abnormality. Adrenals/Urinary Tract: No adrenal masses. Right renal cortical thinning with a reduced right kidney size. Normal size of the left kidney. No renal masses. No collecting system stones. No hydronephrosis. Ureters not well defined, but grossly normal in course and caliber. Bladder is decompressed, otherwise unremarkable. Stomach/Bowel: Moderate hiatal hernia. Stomach otherwise unremarkable. Small bowel and colon are normal in caliber. No wall thickening or inflammation. Moderate increased colonic stool. Left colon diverticula. No diverticulitis. Normal appendix. Vascular/Lymphatic: Diffuse aortic atherosclerosis extending into the branch vessels. 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 Reproductive: Densely calcified small uterine fibroid. Otherwise unremarkable. Other: No hernia or ascites. Musculoskeletal: No acute fracture. No aggressive bone lesion. 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 Previous L4-L5 lumbar spine fusion. Degenerative changes noted of the lumbar spine. IMPRESSION: 1. No acute findings within the chest, abdomen or pelvis. 2. Exam limited for assessment of aortic dissection. Allowing for this, there is no displacement of calcifications along the aortic intima to suggest a dissection. 3. Moderate emphysema. 4. Right renal atrophy. 5. Colonic diverticula and moderate  increased colonic stool burden. No bowel inflammation. 6. Moderate hiatal hernia. 7. Diffuse aortic atherosclerosis. 8. No acute skeletal abnormality. Significant thoracolumbar degenerative changes. Bilateral shoulder arthropathic changes suggesting an inflammatory arthropathy  such as rheumatoid arthritis. Electronically Signed   By: Lajean Manes M.D.   On: 01/02/2020 15:19   DG Chest 2 View  Result Date: 01/02/2020 CLINICAL DATA:  Chest pain. Syncope. EXAM: CHEST - 2 VIEW COMPARISON:  10/31/2019 FINDINGS: The heart size and pulmonary vascularity are normal. Moderate hiatal hernia. Lungs are clear. No acute bone abnormality. No effusions. Aortic atherosclerosis. IMPRESSION: No acute disease. Moderate hiatal hernia. Electronically Signed   By: Lorriane Shire M.D.   On: 01/02/2020 13:03   DG Ribs Bilateral W/Chest  Result Date: 01/05/2020 CLINICAL DATA:  Status post fall and subsequent left rib pain. EXAM: BILATERAL RIBS AND CHEST - 4+ VIEW COMPARISON:  January 02, 2020 FINDINGS: No acute fracture or other bone lesions are seen involving the ribs. A chronic sixth left rib fracture is noted. A chronic deformity of the first left rib is also seen. Bilateral radiopaque pedicle screws are seen within the lower lumbar spine. There is no evidence of pneumothorax or pleural effusion. Both lungs are clear. Heart size and mediastinal contours are within normal limits. IMPRESSION: Chronic first and sixth left rib deformities, without evidence of acute osseous abnormality. Electronically Signed   By: Virgina Norfolk M.D.   On: 01/05/2020 15:30   CT Head Wo Contrast  Result Date: 01/02/2020 CLINICAL DATA:  Head trauma. Suspect intracranial arterial injury. EXAM: CT HEAD WITHOUT CONTRAST TECHNIQUE: Contiguous axial images were obtained from the base of the skull through the vertex without intravenous contrast. COMPARISON:  September 19, 2019 FINDINGS: Brain: No evidence of acute infarction, hemorrhage, hydrocephalus,  extra-axial collection or mass lesion/mass effect. Empty sella. Chronic white matter microvascular ischemic changes. Vascular: No hyperdense vessel. Atherosclerosis of the bilateral carotid siphons. Skull: Demineralized. No skull fracture. Stable small benign dural calcification. Sinuses/Orbits: Mild paranasal sinus mucosal thickening. Normal orbital soft tissues. 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 Cerumen in the left external auditory canal. Trace bilateral mastoid fluid. IMPRESSION: No CT evidence of acute intracranial abnormality. Electronically Signed   By: Revonda Humphrey   On: 01/02/2020 15:01   CT CHEST WO CONTRAST  Result Date: 01/02/2020 CLINICAL DATA:  Syncope. Back pain. Patient cannot tolerate intravenous contrast. EXAM: CT CHEST, ABDOMEN AND PELVIS WITHOUT CONTRAST TECHNIQUE: Multidetector CT imaging of the chest, abdomen and pelvis was performed following the standard protocol without IV contrast. COMPARISON:  None. FINDINGS: CT CHEST FINDINGS Cardiovascular: Heart normal in size. Three-vessel coronary artery calcifications. No pericardial effusion. Aortic atherosclerotic calcifications. No displacement of calcifications away from the aortic wall to suggest a dissection. Atherosclerotic calcifications extend into the aortic arch branch vessels. Mediastinum/Nodes: No neck base, mediastinal or hilar masses or enlarged lymph nodes. Trachea and esophagus are unremarkable. Moderate hiatal hernia. Lungs/Pleura: Minor dependent lung base atelectasis. Moderate centrilobular emphysema. No evidence of pneumonia or pulmonary edema. No mass or suspicious nodule. No pleural effusion or pneumothorax. Musculoskeletal: No fracture or acute finding. No aggressive bone lesion. Significant degenerative changes throughout the thoracic spine. Arthropathic changes of both shoulders, a pattern consistent with an inflammatory arthropathy. CT ABDOMEN PELVIS FINDINGS Hepatobiliary: No focal liver  abnormality is seen. No gallstones, gallbladder wall thickening, or biliary dilatation. Pancreas: Unremarkable. No pancreatic ductal dilatation or surrounding inflammatory changes. Spleen: Normal in size without focal abnormality. Adrenals/Urinary Tract: No adrenal masses. Right renal cortical thinning with a reduced right kidney size. Normal size of the left kidney. No renal masses. No collecting system stones. No hydronephrosis. Ureters not well defined, but grossly normal in course and caliber.  Bladder is decompressed, otherwise unremarkable. Stomach/Bowel: Moderate hiatal hernia. Stomach otherwise unremarkable. Small bowel and colon are normal in caliber. No wall thickening or inflammation. Moderate increased colonic stool. Left colon diverticula. No diverticulitis. Normal appendix. Vascular/Lymphatic: Diffuse aortic atherosclerosis extending into the branch vessels. 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 Reproductive: Densely calcified small uterine fibroid. Otherwise unremarkable. Other: No hernia or ascites. Musculoskeletal: No acute fracture. No aggressive bone lesion. 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 Previous L4-L5 lumbar spine fusion. Degenerative changes noted of the lumbar spine. IMPRESSION: 1. No acute findings within the chest, abdomen or pelvis. 2. Exam limited for assessment of aortic dissection. Allowing for this, there is no displacement of calcifications along the aortic intima to suggest a dissection. 3. Moderate emphysema. 4. Right renal atrophy. 5. Colonic diverticula and moderate increased colonic stool burden. No bowel inflammation. 6. Moderate hiatal hernia. 7. Diffuse aortic atherosclerosis. 8. No acute skeletal abnormality. Significant thoracolumbar degenerative changes. Bilateral shoulder arthropathic changes suggesting an inflammatory arthropathy such as rheumatoid arthritis. Electronically Signed    By: Lajean Manes M.D.   On: 01/02/2020 15:19   CT Cervical Spine Wo Contrast  Result Date: 01/02/2020 CLINICAL DATA:  Syncope.  Chest pain. EXAM: CT CERVICAL SPINE WITHOUT CONTRAST TECHNIQUE: Multidetector CT imaging of the cervical spine was performed without intravenous contrast. Multiplanar CT image reconstructions were also generated. COMPARISON:  09/19/2019 FINDINGS: Alignment: Grade 1 anterolisthesis, C4 on C5 and C7 on T1, both degenerative. No traumatic subluxation. Skull base and vertebrae: No acute fracture. No primary bone lesion or focal pathologic process. Soft tissues and spinal canal: No prevertebral fluid or swelling. No visible canal hematoma. Disc levels: There are significant degenerative changes throughout the cervical spine with marked loss of disc height at C3-C4, moderate loss of disc height C5-C6 and C6-C7 and marked loss of disc height C7-T1. There are bilateral facet degenerative changes. No convincing disc herniation. Upper chest: No acute findings. Lung apices show emphysema. Is mild enlargement of the thyroid gland with small hypo attenuation areas consistent nodules, stable. No specific imaging follow-up recommended. Other: None. IMPRESSION: 1. No fracture or acute finding. 2. Advanced degenerative changes which are unchanged from the prior cervical CT. Electronically Signed   By: Lajean Manes M.D.   On: 01/02/2020 15:23   NM Pulmonary Perfusion  Result Date: 01/02/2020 CLINICAL DATA:  79 year old female with no pelvis in lung scan. EXAM: NUCLEAR MEDICINE PERFUSION LUNG SCAN TECHNIQUE: Perfusion images were obtained in multiple projections after intravenous injection of radiopharmaceutical. Ventilation scans intentionally deferred if perfusion scan and chest x-ray adequate for interpretation during COVID 19 epidemic. RADIOPHARMACEUTICALS:  1.7 mCi Tc-57m MAA IV COMPARISON:  Chest radiograph dated 01/02/2020 and CT dated 01/02/2020 FINDINGS: There is homogeneous and symmetric  perfusion of the lungs. No perfusion defect identified. IMPRESSION: Normal perfusion lung scan. Electronically Signed   By: Anner Crete M.D.   On: 01/02/2020 20:12   ECHOCARDIOGRAM COMPLETE  Result Date: 01/03/2020    ECHOCARDIOGRAM REPORT   Patient Name:   Alyssa [name] Date of Exam: 01/03/2020 Medical Rec #:  [id number]            Height:       63.0 in Accession #:    [id number]           Weight:       89.1 lb Date of Birth:  17-Oct-1941  BSA:          1.371 m Patient Age:    26 years             BP:           117/75 mmHg Patient Gender: F                    HR:           74 bpm. Exam Location:  Inpatient Procedure: 2D Echo Indications:    780.2 syncope  History:        Patient has prior history of Echocardiogram examinations, most                 recent 12/11/2018. Risk Factors:Hypertension, Dyslipidemia and                 Current Smoker.  Sonographer:    Jannett Celestine RDCS (AE) Referring Phys: Earnie Larsson LAMA  Sonographer Comments: Technically difficult study due to poor echo windows. IMPRESSIONS  1. There is severe concentric LVH normal LVEF, 60-65%. Findings could represent hypertensive heart disease versus infiltrative disorder such as cardiac amyoloidosis. Would consider a PYP scan to exclude ATTR cardiac amyloid.  2. Left ventricular ejection fraction, by estimation, is 60 to 65%. The left ventricle has normal function. The left ventricle has no regional wall motion abnormalities. There is severe concentric left ventricular hypertrophy. Left ventricular diastolic  parameters are consistent with Grade II diastolic dysfunction (pseudonormalization). Elevated left atrial pressure.  3. Right ventricular systolic function is normal. The right ventricular size is normal. Tricuspid regurgitation signal is inadequate for assessing PA pressure. The estimated right ventricular systolic pressure is 69.6 mmHg.  4. Left atrial size was mildly dilated.  5. The mitral valve is degenerative. No  evidence of mitral valve regurgitation. No evidence of mitral stenosis.  6. The aortic valve is grossly normal. Aortic valve regurgitation is not visualized. No aortic stenosis is present.  7. The inferior vena cava is normal in size with <50% respiratory variability, suggesting right atrial pressure of 8 mmHg. FINDINGS  Left Ventricle: Left ventricular ejection fraction, by estimation, is 60 to 65%. The left ventricle has normal function. The left ventricle has no regional wall motion abnormalities. The left ventricular internal cavity size was normal in size. There is  severe concentric left ventricular hypertrophy. Left ventricular diastolic parameters are consistent with Grade II diastolic dysfunction (pseudonormalization). Elevated left atrial pressure. Right Ventricle: The right ventricular size is normal. No increase in right ventricular wall thickness. Right ventricular systolic function is normal. Tricuspid regurgitation signal is inadequate for assessing PA pressure. The tricuspid regurgitant velocity is 1.85 m/s, and with an assumed right atrial pressure of 8 mmHg, the estimated right ventricular systolic pressure is 29.5 mmHg. Left Atrium: Left atrial size was mildly dilated. Right Atrium: Right atrial size was normal in size. Pericardium: Trivial pericardial effusion is present. Mitral Valve: The mitral valve is degenerative in appearance. There is mild thickening of the mitral valve leaflet(s). Mild mitral annular calcification. No evidence of mitral valve regurgitation. No evidence of mitral valve stenosis. Tricuspid Valve: The tricuspid valve is grossly normal. Tricuspid valve regurgitation is trivial. No evidence of tricuspid stenosis. Aortic Valve: The aortic valve is grossly normal. Aortic valve regurgitation is not visualized. No aortic stenosis is present. Pulmonic Valve: The pulmonic valve was grossly normal. Pulmonic valve regurgitation is not visualized. No evidence of pulmonic stenosis.  Aorta: The aortic root is normal in size  and structure. Venous: The inferior vena cava is normal in size with less than 50% respiratory variability, suggesting right atrial pressure of 8 mmHg. IAS/Shunts: No atrial level shunt detected by color flow Doppler. Additional Comments: There is severe concentric LVH normal LVEF, 60-65%. Findings could represent hypertensive heart disease versus infiltrative disorder such as cardiac amyoloidosis. Would consider a PYP scan to exclude ATTR cardiac amyloid.  LEFT VENTRICLE PLAX 2D LVIDd:         3.50 cm  Diastology LVIDs:         2.40 cm  LV e' lateral:   5.11 cm/s LV PW:         1.20 cm  LV E/e' lateral: 15.2 LV IVS:        1.40 cm  LV e' medial:    4.24 cm/s LVOT diam:     2.00 cm  LV E/e' medial:  18.3 LV SV:         73 LV SV Index:   53 LVOT Area:     3.14 cm  RIGHT VENTRICLE RV S prime:     10.20 cm/s TAPSE (M-mode): 1.6 cm LEFT ATRIUM             Index       RIGHT ATRIUM           Index LA diam:        3.30 cm 2.41 cm/m  RA Area:     10.10 cm LA Vol (A2C):   25.5 ml 18.60 ml/m RA Volume:   19.10 ml  13.93 ml/m LA Vol (A4C):   23.4 ml 17.07 ml/m LA Biplane Vol: 24.9 ml 18.16 ml/m  AORTIC VALVE LVOT Vmax:   94.60 cm/s LVOT Vmean:  70.200 cm/s LVOT VTI:    0.231 m  AORTA Ao Root diam: 3.20 cm MITRAL VALVE               TRICUSPID VALVE MV Area (PHT): 2.37 cm    TR Peak grad:   13.6 mmHg MV Decel Time: 320 msec    TR Vmax:        184.60 cm/s MV E velocity: 77.60 cm/s MV A velocity: 97.30 cm/s  SHUNTS MV E/A ratio:  0.80        Systemic VTI:  0.23 m                            Systemic Diam: 2.00 cm Eleonore Chiquito MD Electronically signed by Eleonore Chiquito MD Signature Date/Time: 01/03/2020/11:21:07 AM    Final     PERFORMANCE STATUS (ECOG) : 2 - Symptomatic, <50% confined to bed  Review of Systems Unless otherwise noted, a complete review of systems is negative.  Physical Exam General: NAD Pulmonary: Unlabored Extremities: no edema, no joint deformities Skin:  no rashes Neurological: Weakness but otherwise nonfocal  IMPRESSION: I note history of dementia per the chart.  However, at time of my visit patient was completely oriented x4.  She was able to state the reason she came to the hospital and her plan for disposition.  Patient says that her primary goal is to get better and she wants to hopefully be able to return home where she lives with a friend.  She says at baseline, she was independent with her own care but did rely on her friend to fill her pillbox.  Patient says that she is in agreement with plan for rehab.  I note that family  has decided on [facility] [facility].  I introduced the idea of palliative care following her at SNF and patient was in agreement.  I discussed CODE STATUS with patient.  She denies having a living will.  She says that she has talked some with her children in the past about her decisions.  Patient says that she does not think she would want to be resuscitated nor have her life prolonged artificially on machines.  She said she was familiar with the concept of a "DO NOT RESUSCITATE and thinks that that would be what she would want.  However, patient says that she wants to speak with her sons prior to making any decisions.  I offered to call her son but she refused.  She said that she would prefer to speak with him first.  PLAN: -Continue current scope of treatment -Rehab at [facility] ([place]) -Will have palliative care follow at SNF -Patient considering CODE STATUS and wants to speak with her sons prior to making any decision    Time Total: 60 minutes  Visit consisted of counseling and education dealing with the complex and emotionally intense issues of symptom management and palliative care in the setting of serious and potentially life-threatening illness.Greater than 50%  of this time was spent counseling and coordinating care related to the above assessment and plan.  Signed by: Altha Harm, PhD, NP-C

## 2020-01-06 NOTE — Social Work (Signed)
CSW received insurance authorization good 3/21 thru 3/23. Authorization number has not yet been generated. Cristobal Goldmann is coordinator.   Criss Alvine, CSW

## 2020-01-06 NOTE — TOC Progression Note (Signed)
Transition of Care [facility]) - Progression Note    Patient Details  Name: Alyssa [name] MRN: [id number] Date of Birth: 1941/07/02  Transition of Care [facility]) CM/SW [place], [place] Phone Number: [phone] 01/06/2020, 10:12 AM  Clinical Narrative:     CSW spoke with patient at bedside in regards to her bed offers. CSW reviewed and discussed bed offers. Patient asked if CSW could son to assist with decision. CSW spoke with [name] and he chose [facility].  CSW called Freda Munro at [facility] and had to leave a message.  CSW called Barrie Dunker to ascertain if authorization was back however it was still pending.  TOC team will continue to assist with discharge planning needs.    Expected Discharge Plan: [place] Barriers to Discharge: Continued Medical Work up  Expected Discharge Plan and Services Expected Discharge Plan: [place]                                               Social Determinants of Health (SDOH) Interventions    Readmission Risk Interventions Readmission Risk Prevention Plan 12/26/2018  Transportation Screening Complete  Medication Review (RN Care Manager) Complete  PCP or Specialist appointment within 3-5 days of discharge Not Complete  PCP/Specialist Appt Not Complete comments pt for snf. Snf MD to see as needed.  [place] or Home Care Consult Not Complete  HRI or Home Care Consult Pt Refusal Comments NA  SW Recovery Care/Counseling Consult Not Complete  SW Consult Not Complete Comments NA  Palliative Care Screening Complete  Skilled Nursing Facility Complete  Some recent data might be hidden

## 2020-01-06 NOTE — TOC Progression Note (Signed)
Transition of Care [facility]) - Progression Note    Patient Details  Name: Alyssa [name] MRN: [id number] Date of Birth: [birth year]  Transition of Care [facility]) CM/SW [place], [place] Phone Number: [phone] 01/06/2020, 12:04 PM  Clinical Narrative:     CSW spoke with Freda Munro at [facility] and she stated that they could accept patient tomorrow. Patient insurance is still pending. Patient will need updated COVID as well.  TOC team will continue to follow for discharge planning needs.  Expected Discharge Plan: [place] Barriers to Discharge: Continued Medical Work up  Expected Discharge Plan and Services Expected Discharge Plan: [place]                                               Social Determinants of Health (SDOH) Interventions    Readmission Risk Interventions Readmission Risk Prevention Plan 12/26/2018  Transportation Screening Complete  Medication Review (RN Care Manager) Complete  PCP or Specialist appointment within 3-5 days of discharge Not Complete  PCP/Specialist Appt Not Complete comments pt for snf. Snf MD to see as needed.  [place] or Home Care Consult Not Complete  HRI or Home Care Consult Pt Refusal Comments NA  SW Recovery Care/Counseling Consult Not Complete  SW Consult Not Complete Comments NA  Palliative Care Screening Complete  Skilled Nursing Facility Complete  Some recent data might be hidden

## 2020-01-07 LAB — COMPREHENSIVE METABOLIC PANEL
ALT: 13 U/L (ref 0–44)
AST: 22 U/L (ref 15–41)
Albumin: 3 g/dL — ABNORMAL LOW (ref 3.5–5.0)
Alkaline Phosphatase: 61 U/L (ref 38–126)
Anion gap: 12 (ref 5–15)
BUN: 34 mg/dL — ABNORMAL HIGH (ref 8–23)
CO2: 21 mmol/L — ABNORMAL LOW (ref 22–32)
Calcium: 8.8 mg/dL — ABNORMAL LOW (ref 8.9–10.3)
Chloride: 104 mmol/L (ref 98–111)
Creatinine, Ser: 1.72 mg/dL — ABNORMAL HIGH (ref 0.44–1.00)
GFR calc Af Amer: 33 mL/min — ABNORMAL LOW (ref >60)
GFR calc non Af Amer: 28 mL/min — ABNORMAL LOW (ref >60)
Glucose, Bld: 190 mg/dL — ABNORMAL HIGH (ref 70–99)
Potassium: 3.4 mmol/L — ABNORMAL LOW (ref 3.5–5.1)
Sodium: 137 mmol/L (ref 135–145)
Total Bilirubin: 0.4 mg/dL (ref 0.3–1.2)
Total Protein: 5.6 g/dL — ABNORMAL LOW (ref 6.5–8.1)

## 2020-01-07 LAB — CBC
HCT: 25.4 % — ABNORMAL LOW (ref 36.0–46.0)
Hemoglobin: 8.2 g/dL — ABNORMAL LOW (ref 12.0–15.0)
MCH: 29.5 pg (ref 26.0–34.0)
MCHC: 32.3 g/dL (ref 30.0–36.0)
MCV: 91.4 fL (ref 80.0–100.0)
Platelets: 360 10*3/uL (ref 150–400)
RBC: 2.78 MIL/uL — ABNORMAL LOW (ref 3.87–5.11)
RDW: 15.1 % (ref 11.5–15.5)
WBC: 5.5 10*3/uL (ref 4.0–10.5)
nRBC: 0 % (ref 0.0–0.2)

## 2020-01-07 LAB — SARS CORONAVIRUS 2 (TAT 6-24 HRS): SARS Coronavirus 2: NEGATIVE

## 2020-01-07 LAB — MAGNESIUM: Magnesium: 2 mg/dL (ref 1.7–2.4)

## 2020-01-07 LAB — PHOSPHORUS: Phosphorus: 2.3 mg/dL — ABNORMAL LOW (ref 2.5–4.6)

## 2020-01-07 MED ORDER — SUMATRIPTAN SUCCINATE 50 MG PO TABS
50.0000 mg | ORAL_TABLET | ORAL | Status: DC | PRN
  Filled 2020-01-07: qty 1

## 2020-01-07 MED ORDER — NITROGLYCERIN 0.4 MG SL SUBL: 0.4000 mg | SUBLINGUAL_TABLET | SUBLINGUAL | 0 refills | Status: DC | PRN

## 2020-01-07 MED ORDER — ONDANSETRON HCL 4 MG PO TABS: 4.0000 mg | ORAL_TABLET | Freq: Four times a day (QID) | ORAL | 0 refills | Status: DC | PRN

## 2020-01-07 MED ORDER — CLONIDINE HCL 0.2 MG PO TABS
0.2000 mg | ORAL_TABLET | Freq: Two times a day (BID) | ORAL | Status: DC
  Administered 2020-01-07 – 2020-01-08 (×2): 0.2 mg via ORAL
  Filled 2020-01-07 (×2): qty 1

## 2020-01-07 MED ORDER — CLONIDINE HCL 0.1 MG PO TABS
0.1000 mg | ORAL_TABLET | Freq: Two times a day (BID) | ORAL | Status: DC
  Administered 2020-01-07: 0.1 mg via ORAL
  Filled 2020-01-07: qty 1

## 2020-01-07 MED ORDER — ASPIRIN-ACETAMINOPHEN-CAFFEINE 250-250-65 MG PO TABS
1.0000 | ORAL_TABLET | Freq: Four times a day (QID) | ORAL | Status: DC | PRN
  Administered 2020-01-07 – 2020-01-08 (×2): 1 via ORAL
  Filled 2020-01-07 (×4): qty 1

## 2020-01-07 MED ORDER — POTASSIUM CHLORIDE CRYS ER 20 MEQ PO TBCR
60.0000 meq | EXTENDED_RELEASE_TABLET | Freq: Once | ORAL | Status: AC
  Administered 2020-01-07: 60 meq via ORAL
  Filled 2020-01-07: qty 3

## 2020-01-07 MED ORDER — METOPROLOL TARTRATE 25 MG PO TABS
25.0000 mg | ORAL_TABLET | Freq: Once | ORAL | Status: AC
  Administered 2020-01-07: 25 mg via ORAL
  Filled 2020-01-07: qty 1

## 2020-01-07 MED ORDER — METOPROLOL TARTRATE 75 MG PO TABS: 75.0000 mg | ORAL_TABLET | Freq: Two times a day (BID) | ORAL | 0 refills | Status: DC

## 2020-01-07 MED ORDER — SODIUM BICARBONATE 650 MG PO TABS: 1300.0000 mg | ORAL_TABLET | Freq: Two times a day (BID) | ORAL | 0 refills | Status: DC

## 2020-01-07 MED ORDER — METOPROLOL TARTRATE 100 MG PO TABS
100.0000 mg | ORAL_TABLET | Freq: Two times a day (BID) | ORAL | Status: DC
  Administered 2020-01-07 – 2020-01-08 (×2): 100 mg via ORAL
  Filled 2020-01-07 (×2): qty 1

## 2020-01-07 NOTE — Care Management Important Message (Signed)
Important Message  Patient Details  Name: Alyssa [name] MRN: [id number] Date of Birth: 1940-11-16   Medicare Important Message Given:  Yes     Shelda Altes 01/07/2020, 11:09 AM

## 2020-01-07 NOTE — Discharge Summary (Addendum)
Physician Discharge Summary  Alyssa [name] KGU:[id number] DOB: 1941-03-19 DOA: 01/02/2020  PCP: Loretha Brasil, FNP  Admit date: 01/02/2020 Discharge date: 01/08/2020  Time spent: 30 minutes  Recommendations for Outpatient Follow-up:   Acute on CKD stage IV/ non anion gap metabolic acidosis.  (Best Baseline Cr 1.61 on 11/04/2019) -3/20 per nephrology Dr. Rodman Key start sodium bicarbonate tablets 1300 mg BID Recent Labs  Lab 01/04/20 1641 01/05/20 0238 01/06/20 0316 01/07/20 0421 01/08/20 0318  CREATININE 1.86* 1.86* 1.69* 1.72* 1.83*  -Strict in and out  +3.1 -Daily weight Filed Weights   01/05/20 0456 01/06/20 0333 01/07/20 0324  Weight: 44.8 kg 43.9 kg 44.8 kg  -Patient will require Lasix as needed needs to stay euvolemic, given her poor renal function. -3/20 patient to follow-up with Dr. Joelyn Oms as outpatient. Nephrology will sign off   Essential HTN - isosorbide-hydralazine 20-37.5 mg 2 tablets TID  - 3/22 Metoprolol 100 mg BID -3/22 Clonidine 0.2 mg BID -3/23 Lasix IV 60 mg prior to discharge -Will require Lasix PRN  Hyperkalemia  -See essential HTN  Syncope -Multifactorial acute on CKD stage IV, dehydration, anorexia, overall poor condition.  -Daily orthostatic vitals; 3/20 not orthostatic -V/Q scan low probability for pulmonary embolism.Follow up on echocardiogram.  PVD -ASA -Crestor 40 mg daily -Clopidogrel 75 mg daily  Dementia.  -Donezepil 5 mg daily -Buspirone 15 mg TID -Duloxetine 30 mg daily -Temazepam 15 mg PRN -Citalopram 20 mg daily  Anemia of chronic disease -Continue iron supplements.  -Anemia panel findings most consistent with anemia of chronic disease.  Severe protein calorie malnutrition -3/19 nutrition consult; diet effective currently.  Left abdominal pain -Most likely secondary to diverticula and moderate stool burden (constipation) -Resolved with SMOG enema  Rib fractures -Chronic left rib fractures see  results below -Patient aware that given her age and debilitated state fractures will most likely never heal.  Pain management  Physical debilitation -Out of bed to chair q shift ambulate as tolerated   Discharge Diagnoses:  Principal Problem:   AKI (acute kidney injury) ([place]) Active Problems:   PVD (peripheral vascular disease) with claudication ([place])   Gait difficulty   Syncope   Essential hypertension   Hyperlipidemia   CKD (chronic kidney disease) stage 4, GFR 15-29 ml/min ([facility])   Depression with anxiety   SAH (subarachnoid hemorrhage) (H[facility])   Dementia (HCC)   Hyperkalemia   Severe protein-calorie malnutrition ([place])   Acute on chronic renal failure ([place])   Constipation   Palliative care encounter   Discharge Condition: Guarded  Diet recommendation: Renal diet  Filed Weights   01/05/20 0456 01/06/20 0333 01/07/20 0324  Weight: 44.8 kg 43.9 kg 44.8 kg    History of present illness:  79 y.o.BF PMHx Anxiety, Dementia CKD stage IV, solitary kidney, PVD, HTN, HLD, anemia, Subarachnoid hemorrhage managed conservatively in December 2020, antiplatelet therapy was restarted on September 23, 2019,Peripheral neuropathy  Brought to the hospital after patient was found on the floor. She complained of severe shortness of breath and chest pressure/mild chest pain for past 3 days. There is questionable history of syncope as patient says that she passed out but as per son she was on lying on the floor but did not pass out. In the ED troponin x2 were unremarkable. She was found to have elevated D-dimer, CTA chest could not be obtained as patient has worsening renal function.  She denies nausea vomiting or diarrhea. Denies abdominal pain or dysuria. No previous history of stroke or seizures. She  was found to have elevated blood pressure 186/99. 10 mg IV hydralazine x1 was given in the ED.  Hospital Course:  During his hospitalization patient was treated for acute on CKD stage IV  (patient with only 1 kidney).  Has not been following up with nephrologist.  Patient was rehydrated with appropriate fluid and kidney function improved.  Patient also was seen by nephrology in order to ensure she has follow-up with nephrology upon discharge.  Patient was hypertension was also treated and son were counseled that failure to control her hypertension will also negatively affect her single kidney.  Stable for discharge  Procedures: 3/17 CXR; negative acute disease.-Moderate hiatal hernia 3/17 CT head Wo contrast; negative acute intracranial abnormality 3/17 CT C-spine W. Wo contrast;significant degenerative changes throughout the cervical spine with marked loss of disc height at C3-C4,-moderate loss of disc height C5-C6 and C6-C7 and marked loss of discheight C7-T1. There are bilateral facet degenerative changes. No convincing disc herniation. 3/17 CT chest W. Wo contrast;Moderate emphysema. -Right renal atrophy. -Colonic diverticula and moderate increased colonic stool burden.No bowel inflammation. -Moderate hiatal hernia. - Diffuse aortic atherosclerosis. -No acute skeletal abnormality. Significant thoracolumbar degenerative changes. Bilateral shoulder arthropathic changes suggesting an inflammatory arthropathy such as rheumatoid arthritis. 3/17 CT abdomen pelvis W. Wo contrast;-no acute findings within the chest, abdomen or pelvis. -Moderate emphysema. 3/20 DG ribs bilateral; -Positive chronic first and sixth rib fracture   Consultations: nephrology Dr. Rodman Key  Cultures   3/21 SARS coronavirus negative   Antibiotics Anti-infectives (From admission, onward)   None       Discharge Exam: Vitals:   01/07/20 2121 01/07/20 2334 01/08/20 0335 01/08/20 1032  BP:  113/67 (!) 146/83 (!) 155/88  Pulse:    78  Resp:  19 18   Temp: 98.2 F (36.8 C)     TempSrc: Oral     SpO2: 97%     Weight:      Height:        General: A/O x4, mild dementia (some short-term memory  loss), no acute respiratory distress, cachectic Eyes: negative scleral hemorrhage, negative anisocoria, negative icterus ENT: Negative Runny nose, negative gingival bleeding, Neck:  Negative scars, masses, torticollis, lymphadenopathy, JVD Lungs: Clear to auscultation bilaterally without wheezes or crackles Cardiovascular: Regular rate and rhythm without murmur gallop or rub normal S1 and S2   Discharge Instructions  Discharge Instructions    Ambulatory Referral to Palliative Care   Complete by: As directed    At [facility] as of 01/08/2020   No Known Allergies     Medication List    STOP taking these medications   Excedrin Migraine 250-250-65 MG tablet Generic drug: aspirin-acetaminophen-caffeine   magnesium oxide 400 (241.3 Mg) MG tablet Commonly known as: MAG-OX   valproic acid 250 MG capsule Commonly known as: DEPAKENE   VITAMIN D-3 PO     TAKE these medications   acetaminophen 325 MG tablet Commonly known as: TYLENOL Take 2 tablets (650 mg total) by mouth every 6 (six) hours as needed for mild pain, moderate pain or fever (or Fever >/= 101).   aspirin 81 MG EC tablet Take 1 tablet (81 mg total) by mouth daily.   busPIRone 15 MG tablet Commonly known as: BUSPAR Take 15 mg by mouth 3 (three) times daily as needed for anxiety.   citalopram 20 MG tablet Commonly known as: CeleXA Take 1 tablet (20 mg total) by mouth daily.   cloNIDine 0.2 MG tablet  Commonly known as: CATAPRES Take 1 tablet (0.2 mg total) by mouth 2 (two) times daily.   clopidogrel 75 MG tablet Commonly known as: PLAVIX Take 1 tablet (75 mg total) by mouth daily.   donepezil 5 MG tablet Commonly known as: ARICEPT Take 5 mg by mouth daily.   DULoxetine 30 MG capsule Commonly known as: CYMBALTA Take 30 mg by mouth daily.   feeding supplement (ENSURE ENLIVE) Liqd Take 237 mLs by mouth 2 (two) times daily between meals.   furosemide 20 MG tablet Commonly known as:  Lasix Take 2 tablets (40 mg total) by mouth daily as needed for fluid (Fluid overload.  Ensure patient maintains euvolemia.  Also I will for hyperkalemia).   gabapentin 100 MG capsule Commonly known as: NEURONTIN Take 100 mg by mouth 3 (three) times daily.   Iron High-Potency 325 MG Tabs Take 325 mg by mouth daily with breakfast.   isosorbide-hydrALAZINE 20-37.5 MG tablet Commonly known as: BIDIL Take 2 tablets by mouth 3 (three) times daily.   megestrol 40 MG tablet Commonly known as: MEGACE Take 40 mg by mouth 2 (two) times daily.   metoprolol tartrate 100 MG tablet Commonly known as: LOPRESSOR Take 1 tablet (100 mg total) by mouth 2 (two) times daily. What changed:   medication strength  how much to take   multivitamin with minerals Tabs tablet Take 1 tablet by mouth daily.   nitroGLYCERIN 0.4 MG SL tablet Commonly known as: NITROSTAT Place 1 tablet (0.4 mg total) under the tongue every 5 (five) minutes as needed for chest pain.   ondansetron 4 MG tablet Commonly known as: ZOFRAN Take 1 tablet (4 mg total) by mouth every 6 (six) hours as needed for nausea.   pantoprazole 40 MG tablet Commonly known as: PROTONIX Take 1 tablet (40 mg total) by mouth daily. What changed: when to take this   ProAir HFA 108 (90 Base) MCG/ACT inhaler Generic drug: albuterol Inhale 2 puffs into the lungs every 6 (six) hours as needed for wheezing or shortness of breath.   rosuvastatin 40 MG tablet Commonly known as: CRESTOR Take 1 tablet (40 mg total) by mouth at bedtime. What changed: when to take this   sodium bicarbonate 650 MG tablet Take 2 tablets (1,300 mg total) by mouth 2 (two) times daily. What changed: when to take this   temazepam 15 MG capsule Commonly known as: RESTORIL Take 15 mg by mouth daily as needed (for anxiety).      No Known Allergies Contact information for after-discharge care    [facility] .   Service: Skilled  Nursing Contact information: [place] [phone]               The results of significant diagnostics from this hospitalization (including imaging, microbiology, ancillary and laboratory) are listed below for reference.    Significant Diagnostic Studies: CT ABDOMEN PELVIS WO CONTRAST  Result Date: 01/02/2020 CLINICAL DATA:  Syncope. Back pain. Patient cannot tolerate intravenous contrast. EXAM: CT CHEST, ABDOMEN AND PELVIS WITHOUT CONTRAST TECHNIQUE: Multidetector CT imaging of the chest, abdomen and pelvis was performed following the standard protocol without IV contrast. COMPARISON:  None. FINDINGS: CT CHEST FINDINGS Cardiovascular: Heart normal in size. Three-vessel coronary artery calcifications. No pericardial effusion. Aortic atherosclerotic calcifications. No displacement of calcifications away from the aortic wall to suggest a dissection. Atherosclerotic calcifications extend into the aortic arch branch vessels. Mediastinum/Nodes: No neck base, mediastinal or hilar  masses or enlarged lymph nodes. Trachea and esophagus are unremarkable. Moderate hiatal hernia. Lungs/Pleura: Minor dependent lung base atelectasis. Moderate centrilobular emphysema. No evidence of pneumonia or pulmonary edema. No mass or suspicious nodule. No pleural effusion or pneumothorax. Musculoskeletal: No fracture or acute finding. No aggressive bone lesion. Significant degenerative changes throughout the thoracic spine. Arthropathic changes of both shoulders, a pattern consistent with an inflammatory arthropathy. CT ABDOMEN PELVIS FINDINGS Hepatobiliary: No focal liver abnormality is seen. No gallstones, gallbladder wall thickening, or biliary dilatation. Pancreas: Unremarkable. No pancreatic ductal dilatation or surrounding inflammatory changes. Spleen: Normal in size without focal abnormality. Adrenals/Urinary Tract: No adrenal masses. Right renal cortical thinning with a  reduced right kidney size. Normal size of the left kidney. No renal masses. No collecting system stones. No hydronephrosis. Ureters not well defined, but grossly normal in course and caliber. Bladder is decompressed, otherwise unremarkable. Stomach/Bowel: Moderate hiatal hernia. Stomach otherwise unremarkable. Small bowel and colon are normal in caliber. No wall thickening or inflammation. Moderate increased colonic stool. Left colon diverticula. No diverticulitis. Normal appendix. Vascular/Lymphatic: Diffuse aortic atherosclerosis extending into the branch vessels. 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 Reproductive: Densely calcified small uterine fibroid. Otherwise unremarkable. Other: No hernia or ascites. Musculoskeletal: No acute fracture. No aggressive bone lesion. 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 Previous L4-L5 lumbar spine fusion. Degenerative changes noted of the lumbar spine. IMPRESSION: 1. No acute findings within the chest, abdomen or pelvis. 2. Exam limited for assessment of aortic dissection. Allowing for this, there is no displacement of calcifications along the aortic intima to suggest a dissection. 3. Moderate emphysema. 4. Right renal atrophy. 5. Colonic diverticula and moderate increased colonic stool burden. No bowel inflammation. 6. Moderate hiatal hernia. 7. Diffuse aortic atherosclerosis. 8. No acute skeletal abnormality. Significant thoracolumbar degenerative changes. Bilateral shoulder arthropathic changes suggesting an inflammatory arthropathy such as rheumatoid arthritis. Electronically Signed   By: Lajean Manes M.D.   On: 01/02/2020 15:19   DG Chest 2 View  Result Date: 01/02/2020 CLINICAL DATA:  Chest pain. Syncope. EXAM: CHEST - 2 VIEW COMPARISON:  10/31/2019 FINDINGS: The heart size and pulmonary vascularity are normal. Moderate hiatal hernia. Lungs are clear. No acute bone abnormality. No  effusions. Aortic atherosclerosis. IMPRESSION: No acute disease. Moderate hiatal hernia. Electronically Signed   By: Lorriane Shire M.D.   On: 01/02/2020 13:03   DG Ribs Bilateral W/Chest  Result Date: 01/05/2020 CLINICAL DATA:  Status post fall and subsequent left rib pain. EXAM: BILATERAL RIBS AND CHEST - 4+ VIEW COMPARISON:  January 02, 2020 FINDINGS: No acute fracture or other bone lesions are seen involving the ribs. A chronic sixth left rib fracture is noted. A chronic deformity of the first left rib is also seen. Bilateral radiopaque pedicle screws are seen within the lower lumbar spine. There is no evidence of pneumothorax or pleural effusion. Both lungs are clear. Heart size and mediastinal contours are within normal limits. IMPRESSION: Chronic first and sixth left rib deformities, without evidence of acute osseous abnormality. Electronically Signed   By: Virgina Norfolk M.D.   On: 01/05/2020 15:30   CT Head Wo Contrast  Result Date: 01/02/2020 CLINICAL DATA:  Head trauma. Suspect intracranial arterial injury. EXAM: CT HEAD WITHOUT CONTRAST TECHNIQUE: Contiguous axial images were obtained from the base of the skull through the vertex without intravenous contrast. COMPARISON:  September 19, 2019 FINDINGS: Brain: No evidence of acute infarction, hemorrhage,  hydrocephalus, extra-axial collection or mass lesion/mass effect. Empty sella. Chronic white matter microvascular ischemic changes. Vascular: No hyperdense vessel. Atherosclerosis of the bilateral carotid siphons. Skull: Demineralized. No skull fracture. Stable small benign dural calcification. Sinuses/Orbits: Mild paranasal sinus mucosal thickening. Normal orbital soft tissues. 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 Cerumen in the left external auditory canal. Trace bilateral mastoid fluid. IMPRESSION: No CT evidence of acute intracranial abnormality. Electronically Signed   By: Revonda Humphrey   On: 01/02/2020 15:01   CT CHEST WO  CONTRAST  Result Date: 01/02/2020 CLINICAL DATA:  Syncope. Back pain. Patient cannot tolerate intravenous contrast. EXAM: CT CHEST, ABDOMEN AND PELVIS WITHOUT CONTRAST TECHNIQUE: Multidetector CT imaging of the chest, abdomen and pelvis was performed following the standard protocol without IV contrast. COMPARISON:  None. FINDINGS: CT CHEST FINDINGS Cardiovascular: Heart normal in size. Three-vessel coronary artery calcifications. No pericardial effusion. Aortic atherosclerotic calcifications. No displacement of calcifications away from the aortic wall to suggest a dissection. Atherosclerotic calcifications extend into the aortic arch branch vessels. Mediastinum/Nodes: No neck base, mediastinal or hilar masses or enlarged lymph nodes. Trachea and esophagus are unremarkable. Moderate hiatal hernia. Lungs/Pleura: Minor dependent lung base atelectasis. Moderate centrilobular emphysema. No evidence of pneumonia or pulmonary edema. No mass or suspicious nodule. No pleural effusion or pneumothorax. Musculoskeletal: No fracture or acute finding. No aggressive bone lesion. Significant degenerative changes throughout the thoracic spine. Arthropathic changes of both shoulders, a pattern consistent with an inflammatory arthropathy. CT ABDOMEN PELVIS FINDINGS Hepatobiliary: No focal liver abnormality is seen. No gallstones, gallbladder wall thickening, or biliary dilatation. Pancreas: Unremarkable. No pancreatic ductal dilatation or surrounding inflammatory changes. Spleen: Normal in size without focal abnormality. Adrenals/Urinary Tract: No adrenal masses. Right renal cortical thinning with a reduced right kidney size. Normal size of the left kidney. No renal masses. No collecting system stones. No hydronephrosis. Ureters not well defined, but grossly normal in course and caliber. Bladder is decompressed, otherwise unremarkable. Stomach/Bowel: Moderate hiatal hernia. Stomach otherwise unremarkable. Small bowel and colon are  normal in caliber. No wall thickening or inflammation. Moderate increased colonic stool. Left colon diverticula. No diverticulitis. Normal appendix. Vascular/Lymphatic: Diffuse aortic atherosclerosis extending into the branch vessels. 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 Reproductive: Densely calcified small uterine fibroid. Otherwise unremarkable. Other: No hernia or ascites. Musculoskeletal: No acute fracture. No aggressive bone lesion. 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 Previous L4-L5 lumbar spine fusion. Degenerative changes noted of the lumbar spine. IMPRESSION: 1. No acute findings within the chest, abdomen or pelvis. 2. Exam limited for assessment of aortic dissection. Allowing for this, there is no displacement of calcifications along the aortic intima to suggest a dissection. 3. Moderate emphysema. 4. Right renal atrophy. 5. Colonic diverticula and moderate increased colonic stool burden. No bowel inflammation. 6. Moderate hiatal hernia. 7. Diffuse aortic atherosclerosis. 8. No acute skeletal abnormality. Significant thoracolumbar degenerative changes. Bilateral shoulder arthropathic changes suggesting an inflammatory arthropathy such as rheumatoid arthritis. Electronically Signed   By: Lajean Manes M.D.   On: 01/02/2020 15:19   CT Cervical Spine Wo Contrast  Result Date: 01/02/2020 CLINICAL DATA:  Syncope.  Chest pain. EXAM: CT CERVICAL SPINE WITHOUT CONTRAST TECHNIQUE: Multidetector CT imaging of the cervical spine was performed without intravenous contrast. Multiplanar CT image reconstructions were also generated. COMPARISON:  09/19/2019 FINDINGS: Alignment: Grade 1 anterolisthesis, C4 on C5 and C7 on T1, both degenerative. No traumatic subluxation. Skull  base and vertebrae: No acute fracture. No primary bone lesion or focal pathologic process. Soft tissues and spinal canal: No prevertebral fluid or swelling.  No visible canal hematoma. Disc levels: There are significant degenerative changes throughout the cervical spine with marked loss of disc height at C3-C4, moderate loss of disc height C5-C6 and C6-C7 and marked loss of disc height C7-T1. There are bilateral facet degenerative changes. No convincing disc herniation. Upper chest: No acute findings. Lung apices show emphysema. Is mild enlargement of the thyroid gland with small hypo attenuation areas consistent nodules, stable. No specific imaging follow-up recommended. Other: None. IMPRESSION: 1. No fracture or acute finding. 2. Advanced degenerative changes which are unchanged from the prior cervical CT. Electronically Signed   By: Lajean Manes M.D.   On: 01/02/2020 15:23   NM Pulmonary Perfusion  Result Date: 01/02/2020 CLINICAL DATA:  79 year old female with no pelvis in lung scan. EXAM: NUCLEAR MEDICINE PERFUSION LUNG SCAN TECHNIQUE: Perfusion images were obtained in multiple projections after intravenous injection of radiopharmaceutical. Ventilation scans intentionally deferred if perfusion scan and chest x-ray adequate for interpretation during COVID 19 epidemic. RADIOPHARMACEUTICALS:  1.7 mCi Tc-66m MAA IV COMPARISON:  Chest radiograph dated 01/02/2020 and CT dated 01/02/2020 FINDINGS: There is homogeneous and symmetric perfusion of the lungs. No perfusion defect identified. IMPRESSION: Normal perfusion lung scan. Electronically Signed   By: Anner Crete M.D.   On: 01/02/2020 20:12   ECHOCARDIOGRAM COMPLETE  Result Date: 01/03/2020    ECHOCARDIOGRAM REPORT   Patient Name:   Alyssa [name] Date of Exam: 01/03/2020 Medical Rec #:  [id number]            Height:       63.0 in Accession #:    [id number]           Weight:       89.1 lb Date of Birth:  [birth year]           BSA:          1.371 m Patient Age:    78 years             BP:           117/75 mmHg Patient Gender: F                    HR:           74 bpm. Exam Location:  Inpatient Procedure:  2D Echo Indications:    780.2 syncope  History:        Patient has prior history of Echocardiogram examinations, most                 recent 12/11/2018. Risk Factors:Hypertension, Dyslipidemia and                 Current Smoker.  Sonographer:    Jannett Celestine RDCS (AE) Referring Phys: Earnie Larsson LAMA  Sonographer Comments: Technically difficult study due to poor echo windows. IMPRESSIONS  1. There is severe concentric LVH normal LVEF, 60-65%. Findings could represent hypertensive heart disease versus infiltrative disorder such as cardiac amyoloidosis. Would consider a PYP scan to exclude ATTR cardiac amyloid.  2. Left ventricular ejection fraction, by estimation, is 60 to 65%. The left ventricle has normal function. The left ventricle has no regional wall motion abnormalities. There is severe concentric left ventricular hypertrophy. Left ventricular diastolic  parameters are consistent with Grade II diastolic dysfunction (pseudonormalization). Elevated left atrial pressure.  3. Right ventricular systolic  function is normal. The right ventricular size is normal. Tricuspid regurgitation signal is inadequate for assessing PA pressure. The estimated right ventricular systolic pressure is 79.3 mmHg.  4. Left atrial size was mildly dilated.  5. The mitral valve is degenerative. No evidence of mitral valve regurgitation. No evidence of mitral stenosis.  6. The aortic valve is grossly normal. Aortic valve regurgitation is not visualized. No aortic stenosis is present.  7. The inferior vena cava is normal in size with <50% respiratory variability, suggesting right atrial pressure of 8 mmHg. FINDINGS  Left Ventricle: Left ventricular ejection fraction, by estimation, is 60 to 65%. The left ventricle has normal function. The left ventricle has no regional wall motion abnormalities. The left ventricular internal cavity size was normal in size. There is  severe concentric left ventricular hypertrophy. Left ventricular diastolic  parameters are consistent with Grade II diastolic dysfunction (pseudonormalization). Elevated left atrial pressure. Right Ventricle: The right ventricular size is normal. No increase in right ventricular wall thickness. Right ventricular systolic function is normal. Tricuspid regurgitation signal is inadequate for assessing PA pressure. The tricuspid regurgitant velocity is 1.85 m/s, and with an assumed right atrial pressure of 8 mmHg, the estimated right ventricular systolic pressure is 90.3 mmHg. Left Atrium: Left atrial size was mildly dilated. Right Atrium: Right atrial size was normal in size. Pericardium: Trivial pericardial effusion is present. Mitral Valve: The mitral valve is degenerative in appearance. There is mild thickening of the mitral valve leaflet(s). Mild mitral annular calcification. No evidence of mitral valve regurgitation. No evidence of mitral valve stenosis. Tricuspid Valve: The tricuspid valve is grossly normal. Tricuspid valve regurgitation is trivial. No evidence of tricuspid stenosis. Aortic Valve: The aortic valve is grossly normal. Aortic valve regurgitation is not visualized. No aortic stenosis is present. Pulmonic Valve: The pulmonic valve was grossly normal. Pulmonic valve regurgitation is not visualized. No evidence of pulmonic stenosis. Aorta: The aortic root is normal in size and structure. Venous: The inferior vena cava is normal in size with less than 50% respiratory variability, suggesting right atrial pressure of 8 mmHg. IAS/Shunts: No atrial level shunt detected by color flow Doppler. Additional Comments: There is severe concentric LVH normal LVEF, 60-65%. Findings could represent hypertensive heart disease versus infiltrative disorder such as cardiac amyoloidosis. Would consider a PYP scan to exclude ATTR cardiac amyloid.  LEFT VENTRICLE PLAX 2D LVIDd:         3.50 cm  Diastology LVIDs:         2.40 cm  LV e' lateral:   5.11 cm/s LV PW:         1.20 cm  LV E/e' lateral: 15.2  LV IVS:        1.40 cm  LV e' medial:    4.24 cm/s LVOT diam:     2.00 cm  LV E/e' medial:  18.3 LV SV:         73 LV SV Index:   53 LVOT Area:     3.14 cm  RIGHT VENTRICLE RV S prime:     10.20 cm/s TAPSE (M-mode): 1.6 cm LEFT ATRIUM             Index       RIGHT ATRIUM           Index LA diam:        3.30 cm 2.41 cm/m  RA Area:     10.10 cm LA Vol (A2C):   25.5 ml 18.60 ml/m RA Volume:  19.10 ml  13.93 ml/m LA Vol (A4C):   23.4 ml 17.07 ml/m LA Biplane Vol: 24.9 ml 18.16 ml/m  AORTIC VALVE LVOT Vmax:   94.60 cm/s LVOT Vmean:  70.200 cm/s LVOT VTI:    0.231 m  AORTA Ao Root diam: 3.20 cm MITRAL VALVE               TRICUSPID VALVE MV Area (PHT): 2.37 cm    TR Peak grad:   13.6 mmHg MV Decel Time: 320 msec    TR Vmax:        184.60 cm/s MV E velocity: 77.60 cm/s MV A velocity: 97.30 cm/s  SHUNTS MV E/A ratio:  0.80        Systemic VTI:  0.23 m                            Systemic Diam: 2.00 cm Eleonore Chiquito MD Electronically signed by Eleonore Chiquito MD Signature Date/Time: 01/03/2020/11:21:07 AM    Final     Microbiology: Recent Results (from the past 240 hour(s))  SARS CORONAVIRUS 2 (TAT 6-24 HRS) Nasopharyngeal Nasopharyngeal Swab     Status: None   Collection Time: 01/02/20  4:49 PM   Specimen: Nasopharyngeal Swab  Result Value Ref Range Status   SARS Coronavirus 2 NEGATIVE NEGATIVE Final    Comment: (NOTE) SARS-CoV-2 target nucleic acids are NOT DETECTED. The SARS-CoV-2 RNA is generally detectable in upper and lower respiratory specimens during the acute phase of infection. Negative results do not preclude SARS-CoV-2 infection, do not rule out co-infections with other pathogens, and should not be used as the sole basis for treatment or other patient management decisions. Negative results must be combined with clinical observations, patient history, and epidemiological information. The expected result is Negative. Fact Sheet for Patients: SugarRoll.be Fact  Sheet for Healthcare Providers: https://www.-mathews.com/ This test is not yet approved or cleared by the [place] FDA and  has been authorized for detection and/or diagnosis of SARS-CoV-2 by FDA under an Emergency Use Authorization (EUA). This EUA will remain  in effect (meaning this test can be used) for the duration of the COVID-19 declaration under Section 56 4(b)(1) of the Act, 21 U.S.C. section 360bbb-3(b)(1), unless the authorization is terminated or revoked sooner. Performed at [facility], [place] [address] [place], [place] [address]   SARS CORONAVIRUS 2 (TAT 6-24 HRS) Nasopharyngeal Nasopharyngeal Swab     Status: None   Collection Time: 01/06/20  9:12 PM   Specimen: Nasopharyngeal Swab  Result Value Ref Range Status   SARS Coronavirus 2 NEGATIVE NEGATIVE Final    Comment: (NOTE) SARS-CoV-2 target nucleic acids are NOT DETECTED. The SARS-CoV-2 RNA is generally detectable in upper and lower respiratory specimens during the acute phase of infection. Negative results do not preclude SARS-CoV-2 infection, do not rule out co-infections with other pathogens, and should not be used as the sole basis for treatment or other patient management decisions. Negative results must be combined with clinical observations, patient history, and epidemiological information. The expected result is Negative. Fact Sheet for Patients: SugarRoll.be Fact Sheet for Healthcare Providers: https://www.-mathews.com/ This test is not yet approved or cleared by the [place] FDA and  has been authorized for detection and/or diagnosis of SARS-CoV-2 by FDA under an Emergency Use Authorization (EUA). This EUA will remain  in effect (meaning this test can be used) for the duration of the COVID-19 declaration under Section 56 4(b)(1) of the  Act, 21 U.S.C. section 360bbb-3(b)(1), unless the authorization is terminated or revoked  sooner. Performed at [facility], [place] [address] [place],  [address]      Labs: Basic Metabolic Panel: Recent Labs  Lab 01/04/20 0233 01/04/20 1002 01/04/20 1641 01/05/20 0238 01/06/20 0316 01/07/20 0421 01/08/20 0318  NA   < >  --  140 140 139 137 136  K   < >  --  3.6 3.8 3.9 3.4* 5.6*  CL   < >  --  114* 110 107 104 106  CO2   < >  --  15* 18* 19* 21* 22  GLUCOSE   < >  --  135* 104* 128* 190* 115*  BUN   < >  --  43* 42* 42* 34* 34*  CREATININE   < >  --  1.86* 1.86* 1.69* 1.72* 1.83*  CALCIUM   < >  --  8.8* 8.5* 8.8* 8.8* 8.6*  MG   < > 1.8 1.8 1.8 1.9 2.0 2.0  PHOS  --  2.8  --  2.4* 2.1* 2.3* 2.5   < > = values in this interval not displayed.   Liver Function Tests: Recent Labs  Lab 01/03/20 0602 01/05/20 0238 01/06/20 0316 01/07/20 0421 01/08/20 0318  AST 22 19 23 22 26   ALT 14 11 12 13 16   ALKPHOS 61 55 53 61 46  BILITOT 0.7 0.5 0.4 0.4 0.7  PROT 6.3* 5.6* 5.7* 5.6* 4.9*  ALBUMIN 3.7 3.0* 3.1* 3.0* 2.6*   No results for input(s): LIPASE, AMYLASE in the last 168 hours. No results for input(s): AMMONIA in the last 168 hours. CBC: Recent Labs  Lab 01/04/20 1002 01/05/20 0238 01/06/20 0316 01/07/20 0421 01/08/20 0318  WBC 4.5 4.9 6.8 5.5 5.8  NEUTROABS 2.7  --   --   --   --   HGB 9.1* 8.0* 8.7* 8.2* 7.9*  HCT 26.8* 23.5* 25.1* 25.4* 24.2*  MCV 89.9 89.0 87.8 91.4 91.3  PLT 302 294 303 360 340   Cardiac Enzymes: Recent Labs  Lab 01/02/20 1430 01/04/20 0233  CKTOTAL 1,075* 485*   BNP: BNP (last 3 results) Recent Labs    03/22/19 2128 01/02/20 1430  BNP 161.5* 90.8    ProBNP (last 3 results) No results for input(s): PROBNP in the last 8760 hours.  CBG: No results for input(s): GLUCAP in the last 168 hours.     Signed:  Dia Crawford, MD [facility] [phone] pager

## 2020-01-07 NOTE — Plan of Care (Signed)
  Problem: Activity: Goal: Risk for activity intolerance will decrease Outcome: Progressing   Problem: Clinical Measurements: Goal: Respiratory complications will improve Outcome: Completed/Met   Problem: Nutrition: Goal: Adequate nutrition will be maintained Outcome: Completed/Met   Problem: Coping: Goal: Level of anxiety will decrease Outcome: Completed/Met   Problem: Elimination: Goal: Will not experience complications related to urinary retention Outcome: Completed/Met   Problem: Pain Managment: Goal: General experience of comfort will improve Outcome: Completed/Met

## 2020-01-07 NOTE — Progress Notes (Signed)
PROGRESS NOTE    Alyssa [name]  DZH:[id number] DOB: October 24, 1940 DOA: 01/02/2020 PCP: Loretha Brasil, FNP     Brief Narrative:  79 y.o. BF PMHx Anxiety, Dementia CKD stage IV, solitary kidney, PVD, HTN, HLD, anemia, Subarachnoid hemorrhage managed conservatively in December 2020, antiplatelet therapy was restarted on September 23, 2019,Peripheral neuropathy  Brought to the hospital after patient was found on the floor.  She complained of severe shortness of breath and chest pressure/mild chest pain for past 3 days.  There is questionable history of syncope as patient says that she passed out but as per son she was on lying on the floor but did not pass out.  In the ED troponin x2 were unremarkable.  She was found to have elevated D-dimer, CTA chest could not be obtained as patient has worsening renal function.  She denies nausea vomiting or diarrhea. Denies abdominal pain or dysuria. No previous history of stroke or seizures. She was found to have elevated blood pressure 186/99. 10 mg IV hydralazine x1 was given in the ED.    Subjective: 3/22 A/O x4, negative CP, negative abdominal pain.   Assessment & Plan:   Principal Problem:   AKI (acute kidney injury) ([place]) Active Problems:   PVD (peripheral vascular disease) with claudication ([place])   Gait difficulty   Syncope   Essential hypertension   Hyperlipidemia   CKD (chronic kidney disease) stage 4, GFR 15-29 ml/min (HCC)   Depression with anxiety   SAH (subarachnoid hemorrhage) (HCC)   Dementia (HCC)   Hyperkalemia   Severe protein-calorie malnutrition ([place])   Acute on chronic renal failure (HCC)   Constipation   Palliative care encounter   Acute on CKD stage IV/ non anion gap metabolic acidosis.  (Best Baseline Cr 1.61 on 11/04/2019) -3/20 DC per nephrology sodium bicarbonate plus D5W at 30ml/hr* -3/20 per nephrology Dr. Rodman Key start sodium bicarbonate tablets 1300 mg BID Recent Labs  Lab 01/04/20 0233  01/04/20 1641 01/05/20 0238 01/06/20 0316 01/07/20 0421  CREATININE 2.11* 1.86* 1.86* 1.69* 1.72*  -Strict in and out +2.4 L -Daily weight Filed Weights   01/05/20 0456 01/06/20 0333 01/07/20 0324  Weight: 44.8 kg 43.9 kg 44.8 kg  -3/20 patient to follow-up with Dr. Joelyn Oms as outpatient. Nephrology will sign off   Essential HTN -3/22 clonidine 0.2 mg BID -3/20 increase isosorbide-hydralazine 20-37.5 mg 2 tablets TID  -3/22 increase Metoprolol BID -3/20 Hydralazine PRN  Syncope -Multifactorial acute on CKD stage IV, dehydration, anorexia, overall poor condition.  -Daily orthostatic vitals; 3/20 not orthostatic -V/Q scan low probability for pulmonary embolism. Follow up on echocardiogram.   PVD -ASA -Crestor 40 mg daily -Clopidogrel 75 mg daily  Dementia.  -Donezepil 5 mg daily -Buspirone 15 mg TID -Duloxetine 30 mg daily -Temazepam 15 mg PRN -Citalopram 20 mg daily  Anemia of chronic disease -Continue iron supplements.  -Anemia panel findings most consistent with anemia of chronic disease.  Severe protein calorie malnutrition -3/19 nutrition consult; diet effective currently.  Left abdominal pain -Most likely secondary to diverticula and moderate stool burden (constipation) -SMOG enema -3/22 resolved  Rib fractures -Chronic left rib fractures see results below -Patient aware that given her age and debilitated state fractures will most likely never heal.  Pain management  Physical debilitation -Out of bed to chair q shift ambulate as tolerated  Goals of care -3/19 palliative care; evaluate for palliative care vs change of code to DNR vs hospice     DVT prophylaxis: Heparin subcu Code  Status: Full Family Communication: 3/20 son at bedside discussed plan of care answered all questions Disposition Plan: Awaiting PT/OT recommendation.  Awaiting nephrology recommendation. 1.  Where the patient is from; home 2.  Anticipated d/c place. 3.  Barriers to  d/c SNF when BP controlled     Consultants:  nephrology Dr. Rodman Key    Procedures/Significant Events:  3/17 CXR; negative acute disease.-Moderate hiatal hernia 3/17 CT head Wo contrast; negative acute intracranial abnormality 3/17 CT C-spine W. Wo contrast;significant degenerative changes throughout the cervical spine with marked loss of disc height at C3-C4,-moderate loss of disc height C5-C6 and C6-C7 and marked loss of discheight C7-T1. There are bilateral facet degenerative changes. No convincing disc herniation. 3/17 CT chest W. Wo contrast;Moderate emphysema. -Right renal atrophy. -Colonic diverticula and moderate increased colonic stool burden.No bowel inflammation. -Moderate hiatal hernia. - Diffuse aortic atherosclerosis. -No acute skeletal abnormality. Significant thoracolumbar degenerative changes. Bilateral shoulder arthropathic changes suggesting an inflammatory arthropathy such as rheumatoid arthritis. 3/17 CT abdomen pelvis W. Wo contrast;-no acute findings within the chest, abdomen or pelvis. -Moderate emphysema. 3/20 DG ribs bilateral; -Positive chronic first and sixth rib fracture    I have personally reviewed and interpreted all radiology studies and my findings are as above.  VENTILATOR SETTINGS:    Cultures   Antimicrobials: Anti-infectives (From admission, onward)   None       Devices    LINES / TUBES:      Continuous Infusions:    Objective: Vitals:   01/07/20 1035 01/07/20 1134 01/07/20 1434 01/07/20 1744  BP: (!) 151/100 (!) 155/83 (!) 160/88 (!) 166/86  Pulse:    65  Resp: 16 20 18    Temp:    98.1 F (36.7 C)  TempSrc:    Oral  SpO2:    98%  Weight:      Height:        Intake/Output Summary (Last 24 hours) at 01/07/2020 1911 Last data filed at 01/07/2020 1400 Gross per 24 hour  Intake 480 ml  Output --  Net 480 ml   Filed Weights   01/05/20 0456 01/06/20 0333 01/07/20 0324  Weight: 44.8 kg 43.9 kg 44.8 kg    Physical Exam:  General: A/O x4, mild dementia (some short-term memory loss), no acute respiratory distress Eyes: negative scleral hemorrhage, negative anisocoria, negative icterus ENT: Negative Runny nose, negative gingival bleeding, Neck:  Negative scars, masses, torticollis, lymphadenopathy, JVD Lungs: Clear to auscultation bilaterally without wheezes or crackles Cardiovascular: Regular rate and rhythm without murmur gallop or rub normal S1 and S2 Abdomen: negative abdominal pain, nondistended, positive soft, bowel sounds, no rebound, no ascites, no appreciable mass Extremities: No significant cyanosis, clubbing, or edema bilateral lower extremities Skin: Negative rashes, lesions, ulcers Psychiatric:  Negative depression, negative anxiety, negative fatigue, negative mania  Central nervous system:  Cranial nerves II through XII intact, tongue/uvula midline, all extremities muscle strength 5/5, negative dysarthria, negative expressive aphasia, negative receptive aphasia.      Data Reviewed: Care during the described time interval was provided by me .  I have reviewed this patient's available data, including medical history, events of note, physical examination, and all test results as part of my evaluation.  CBC: Recent Labs  Lab 01/03/20 0207 01/04/20 1002 01/05/20 0238 01/06/20 0316 01/07/20 0421  WBC 8.4 4.5 4.9 6.8 5.5  NEUTROABS  --  2.7  --   --   --   HGB 10.3* 9.1* 8.0* 8.7* 8.2*  HCT 31.7* 26.8* 23.5* 25.1*  25.4*  MCV 92.7 89.9 89.0 87.8 91.4  PLT 288 302 294 303 568   Basic Metabolic Panel: Recent Labs  Lab 01/04/20 0233 01/04/20 1002 01/04/20 1641 01/05/20 0238 01/06/20 0316 01/07/20 0421  NA 139  --  140 140 139 137  K 3.9  --  3.6 3.8 3.9 3.4*  CL 117*  --  114* 110 107 104  CO2 12*  --  15* 18* 19* 21*  GLUCOSE 104*  --  135* 104* 128* 190*  BUN 48*  --  43* 42* 42* 34*  CREATININE 2.11*  --  1.86* 1.86* 1.69* 1.72*  CALCIUM 8.5*  --  8.8* 8.5*  8.8* 8.8*  MG  --  1.8 1.8 1.8 1.9 2.0  PHOS  --  2.8  --  2.4* 2.1* 2.3*   GFR: Estimated Creatinine Clearance: 19.7 mL/min (A) (by C-G formula based on SCr of 1.72 mg/dL (H)). Liver Function Tests: Recent Labs  Lab 01/03/20 0602 01/05/20 0238 01/06/20 0316 01/07/20 0421  AST 22 19 23 22   ALT 14 11 12 13   ALKPHOS 61 55 53 61  BILITOT 0.7 0.5 0.4 0.4  PROT 6.3* 5.6* 5.7* 5.6*  ALBUMIN 3.7 3.0* 3.1* 3.0*   No results for input(s): LIPASE, AMYLASE in the last 168 hours. No results for input(s): AMMONIA in the last 168 hours. Coagulation Profile: No results for input(s): INR, PROTIME in the last 168 hours. Cardiac Enzymes: Recent Labs  Lab 01/02/20 1430 01/04/20 0233  CKTOTAL 1,075* 485*   BNP (last 3 results) No results for input(s): PROBNP in the last 8760 hours. HbA1C: No results for input(s): HGBA1C in the last 72 hours. CBG: No results for input(s): GLUCAP in the last 168 hours. Lipid Profile: No results for input(s): CHOL, HDL, LDLCALC, TRIG, CHOLHDL, LDLDIRECT in the last 72 hours. Thyroid Function Tests: No results for input(s): TSH, T4TOTAL, FREET4, T3FREE, THYROIDAB in the last 72 hours. Anemia Panel: No results for input(s): VITAMINB12, FOLATE, FERRITIN, TIBC, IRON, RETICCTPCT in the last 72 hours. Sepsis Labs: No results for input(s): PROCALCITON, LATICACIDVEN in the last 168 hours.  Recent Results (from the past 240 hour(s))  SARS CORONAVIRUS 2 (TAT 6-24 HRS) Nasopharyngeal Nasopharyngeal Swab     Status: None   Collection Time: 01/02/20  4:49 PM   Specimen: Nasopharyngeal Swab  Result Value Ref Range Status   SARS Coronavirus 2 NEGATIVE NEGATIVE Final    Comment: (NOTE) SARS-CoV-2 target nucleic acids are NOT DETECTED. The SARS-CoV-2 RNA is generally detectable in upper and lower respiratory specimens during the acute phase of infection. Negative results do not preclude SARS-CoV-2 infection, do not rule out co-infections with other pathogens, and  should not be used as the sole basis for treatment or other patient management decisions. Negative results must be combined with clinical observations, patient history, and epidemiological information. The expected result is Negative. Fact Sheet for Patients: SugarRoll.be Fact Sheet for Healthcare Providers: https://www.-mathews.com/ This test is not yet approved or cleared by the [place] FDA and  has been authorized for detection and/or diagnosis of SARS-CoV-2 by FDA under an Emergency Use Authorization (EUA). This EUA will remain  in effect (meaning this test can be used) for the duration of the COVID-19 declaration under Section 56 4(b)(1) of the Act, 21 U.S.C. section 360bbb-3(b)(1), unless the authorization is terminated or revoked sooner. Performed at [facility], [place] [address], [place], [place] [address]   SARS CORONAVIRUS 2 (TAT 6-24 HRS) Nasopharyngeal Nasopharyngeal Swab  Status: None   Collection Time: 01/06/20  9:12 PM   Specimen: Nasopharyngeal Swab  Result Value Ref Range Status   SARS Coronavirus 2 NEGATIVE NEGATIVE Final    Comment: (NOTE) SARS-CoV-2 target nucleic acids are NOT DETECTED. The SARS-CoV-2 RNA is generally detectable in upper and lower respiratory specimens during the acute phase of infection. Negative results do not preclude SARS-CoV-2 infection, do not rule out co-infections with other pathogens, and should not be used as the sole basis for treatment or other patient management decisions. Negative results must be combined with clinical observations, patient history, and epidemiological information. The expected result is Negative. Fact Sheet for Patients: SugarRoll.be Fact Sheet for Healthcare Providers: https://www.Sheril Hammond-mathews.com/ This test is not yet approved or cleared by the [place] FDA and  has been authorized for detection and/or  diagnosis of SARS-CoV-2 by FDA under an Emergency Use Authorization (EUA). This EUA will remain  in effect (meaning this test can be used) for the duration of the COVID-19 declaration under Section 56 4(b)(1) of the Act, 21 U.S.C. section 360bbb-3(b)(1), unless the authorization is terminated or revoked sooner. Performed at [facility], [place] [address], [place], [place] [address]          Radiology Studies: No results found.      Scheduled Meds: . aspirin EC  81 mg Oral Daily  . citalopram  20 mg Oral Daily  . cloNIDine  0.1 mg Oral BID  . clopidogrel  75 mg Oral Daily  . donepezil  5 mg Oral Daily  . DULoxetine  30 mg Oral Daily  . feeding supplement (ENSURE ENLIVE)  237 mL Oral TID BM  . ferrous sulfate  325 mg Oral Q breakfast  . gabapentin  100 mg Oral TID  . heparin  5,000 Units Subcutaneous Q8H  . isosorbide-hydrALAZINE  2 tablet Oral TID  . metoprolol tartrate  100 mg Oral BID  . multivitamin with minerals  1 tablet Oral Daily  . pantoprazole  40 mg Oral QAC breakfast  . rosuvastatin  40 mg Oral Daily  . sodium bicarbonate  1,300 mg Oral BID   Continuous Infusions:    LOS: 3 days    Time spent:40 min    Blannie Shedlock, Geraldo Docker, MD [facility] Pager [phone]  If 7PM-7AM, please contact night-coverage [email or web address] Password [facility] 01/07/2020, 7:11 PM

## 2020-01-07 NOTE — TOC Progression Note (Signed)
Transition of Care [facility]) - Progression Note    Patient Details  Name: SHANNA UN MRN: [id number] Date of Birth: 1941/10/04  Transition of Care [facility]) CM/SW [place], [place] Phone Number: 01/07/2020, 12:54 PM  Clinical Narrative:    CSW spoke with pt's physician this morning concerning dispsotion pt to [facility]. Physician is adjusting pt's medication for blood pressure.  Pt will not be discharging today according to physician until tomorrow.  CSW will continue to follow for disposition.   Expected Discharge Plan: [place] Barriers to Discharge: Continued Medical Work up  Expected Discharge Plan and Services Expected Discharge Plan: [place]                                               Social Determinants of Health (SDOH) Interventions    Readmission Risk Interventions Readmission Risk Prevention Plan 12/26/2018  Transportation Screening Complete  Medication Review (RN Care Manager) Complete  PCP or Specialist appointment within 3-5 days of discharge Not Complete  PCP/Specialist Appt Not Complete comments pt for snf. Snf MD to see as needed.  [place] or Home Care Consult Not Complete  HRI or Home Care Consult Pt Refusal Comments NA  SW Recovery Care/Counseling Consult Not Complete  SW Consult Not Complete Comments NA  Palliative Care Screening Complete  Skilled Nursing Facility Complete  Some recent data might be hidden

## 2020-01-08 DIAGNOSIS — N189 Chronic kidney disease, unspecified: Secondary | ICD-10-CM

## 2020-01-08 DIAGNOSIS — Z7189 Other specified counseling: Secondary | ICD-10-CM

## 2020-01-08 DIAGNOSIS — R269 Unspecified abnormalities of gait and mobility: Secondary | ICD-10-CM

## 2020-01-08 DIAGNOSIS — I609 Nontraumatic subarachnoid hemorrhage, unspecified: Secondary | ICD-10-CM

## 2020-01-08 LAB — CBC
HCT: 24.2 % — ABNORMAL LOW (ref 36.0–46.0)
Hemoglobin: 7.9 g/dL — ABNORMAL LOW (ref 12.0–15.0)
MCH: 29.8 pg (ref 26.0–34.0)
MCHC: 32.6 g/dL (ref 30.0–36.0)
MCV: 91.3 fL (ref 80.0–100.0)
Platelets: 340 10*3/uL (ref 150–400)
RBC: 2.65 MIL/uL — ABNORMAL LOW (ref 3.87–5.11)
RDW: 15 % (ref 11.5–15.5)
WBC: 5.8 10*3/uL (ref 4.0–10.5)
nRBC: 0.5 % — ABNORMAL HIGH (ref 0.0–0.2)

## 2020-01-08 LAB — COMPREHENSIVE METABOLIC PANEL
ALT: 16 U/L (ref 0–44)
AST: 26 U/L (ref 15–41)
Albumin: 2.6 g/dL — ABNORMAL LOW (ref 3.5–5.0)
Alkaline Phosphatase: 46 U/L (ref 38–126)
Anion gap: 8 (ref 5–15)
BUN: 34 mg/dL — ABNORMAL HIGH (ref 8–23)
CO2: 22 mmol/L (ref 22–32)
Calcium: 8.6 mg/dL — ABNORMAL LOW (ref 8.9–10.3)
Chloride: 106 mmol/L (ref 98–111)
Creatinine, Ser: 1.83 mg/dL — ABNORMAL HIGH (ref 0.44–1.00)
GFR calc Af Amer: 31 mL/min — ABNORMAL LOW (ref >60)
GFR calc non Af Amer: 26 mL/min — ABNORMAL LOW (ref >60)
Glucose, Bld: 115 mg/dL — ABNORMAL HIGH (ref 70–99)
Potassium: 5.6 mmol/L — ABNORMAL HIGH (ref 3.5–5.1)
Sodium: 136 mmol/L (ref 135–145)
Total Bilirubin: 0.7 mg/dL (ref 0.3–1.2)
Total Protein: 4.9 g/dL — ABNORMAL LOW (ref 6.5–8.1)

## 2020-01-08 LAB — MAGNESIUM: Magnesium: 2 mg/dL (ref 1.7–2.4)

## 2020-01-08 LAB — PHOSPHORUS: Phosphorus: 2.5 mg/dL (ref 2.5–4.6)

## 2020-01-08 MED ORDER — CLONIDINE HCL 0.2 MG PO TABS: 0.2000 mg | ORAL_TABLET | Freq: Two times a day (BID) | ORAL | 0 refills | Status: DC

## 2020-01-08 MED ORDER — FUROSEMIDE 10 MG/ML IJ SOLN
60.0000 mg | Freq: Once | INTRAMUSCULAR | Status: AC
  Administered 2020-01-08: 60 mg via INTRAVENOUS
  Filled 2020-01-08: qty 6

## 2020-01-08 MED ORDER — FUROSEMIDE 20 MG PO TABS: 40.0000 mg | ORAL_TABLET | Freq: Every day | ORAL | 0 refills | Status: DC | PRN

## 2020-01-08 MED ORDER — METOPROLOL TARTRATE 100 MG PO TABS: 100.0000 mg | ORAL_TABLET | Freq: Two times a day (BID) | ORAL | 0 refills | Status: DC

## 2020-01-08 NOTE — Progress Notes (Signed)
Daily Progress Note   [name]       Date: 01/08/2020 DOB: 1941/08/12  Age: 79 y.o. MRN#: [id number] Attending Physician: Alyssa Bossier, MD Primary Care Physician: Alyssa Brasil, FNP Admit Date: 01/02/2020  Reason for Consultation/Follow-up: Establishing goals of care  Subjective/GOC: Patient awake, alert, oriented to person/place/situation. Able to participate in discussion. Denies pain or discomfort.   Patient is hoping to be discharged today. She is agreeable to SNF rehab but ultimately is hopeful to return home with friend following rehab stay.   Again introduced role of palliative medicine. Discussed advanced directive and code status. Patient shares she wishes for son, Alyssa [name] to be POA in the event she could not make decisions for herself. Alyssa [name] shares that she has previously discussed wishes with her son, including that she does not wish for heroic measures at EOL including life support machines. She does not have documentation of this. Introduced and discussed MOST form and importance of documenting her wishes. Offered to call son to discuss MOST form but patient tells me he is a Education officer, museum and currently unavailable at work. MOST form left at bedside and encouraged patient to further discuss with her son.   Length of Stay: 4  Current Medications: Scheduled Meds:  . aspirin EC  81 mg Oral Daily  . citalopram  20 mg Oral Daily  . cloNIDine  0.2 mg Oral BID  . clopidogrel  75 mg Oral Daily  . donepezil  5 mg Oral Daily  . DULoxetine  30 mg Oral Daily  . feeding supplement (ENSURE ENLIVE)  237 mL Oral TID BM  . ferrous sulfate  325 mg Oral Q breakfast  . gabapentin  100 mg Oral TID  . heparin  5,000 Units Subcutaneous Q8H  . isosorbide-hydrALAZINE   2 tablet Oral TID  . metoprolol tartrate  100 mg Oral BID  . multivitamin with minerals  1 tablet Oral Daily  . pantoprazole  40 mg Oral QAC breakfast  . rosuvastatin  40 mg Oral Daily  . sodium bicarbonate  1,300 mg Oral BID    Continuous Infusions:   PRN Meds: acetaminophen, aspirin-acetaminophen-caffeine, busPIRone, hydrALAZINE, nitroGLYCERIN, ondansetron **OR** ondansetron (ZOFRAN) IV, temazepam  Physical Exam Vitals and nursing note reviewed.  Constitutional:      General: She is awake.  HENT:     Head: Normocephalic and atraumatic.  Pulmonary:     Effort: No tachypnea, accessory muscle usage or respiratory distress.  Skin:    General: Skin is warm and dry.  Neurological:     Mental Status: She is alert and oriented to person, place, and time.  Psychiatric:        Mood and Affect: Mood normal.        Speech: Speech normal.        Behavior: Behavior normal.        Cognition and Memory: Cognition normal.             Vital Signs: BP (!) 155/88   Pulse 78   Temp 98.2 F (36.8 C) (Oral)   Resp 18   Ht 5\' 3"  (1.6 m)   Wt 44.8 kg   SpO2 97%   BMI 17.50 kg/m  SpO2: SpO2: 97 % O2 Device: O2 Device: Room Air O2 Flow Rate:    Intake/output summary:   Intake/Output Summary (Last 24 hours) at 01/08/2020 1124 Last data filed at 01/08/2020 0840 Gross per 24 hour  Intake 420 ml  Output --  Net 420 ml   LBM: Last BM Date: 01/07/20 Baseline Weight: Weight: 44.5 kg Most recent weight: Weight: 44.8 kg       Palliative Assessment/Data: PPS 50%      Patient Active Problem List   Diagnosis Date Noted  . Palliative care encounter   . Constipation 01/05/2020  . Severe protein-calorie malnutrition ([place]) 01/04/2020  . Acute on chronic renal failure ([place]) 01/04/2020  . Hyperkalemia 01/03/2020  . AKI (acute kidney injury) ([place]) 01/03/2020  . Acute respiratory failure with hypoxia ([place]) 11/01/2019  . Acute lower UTI 11/01/2019  . CAP (community acquired pneumonia)  11/01/2019  . Acute cystitis without hematuria   . Encephalopathy 09/18/2019  . SAH (subarachnoid hemorrhage) ([place]) 09/18/2019  . Dementia ([place]) 09/18/2019  . Metabolic acidosis 16/38/4536  . ARF (acute renal failure) ([place]) 09/17/2019  . Acute encephalopathy 09/17/2019  . Abnormal CT scan, colon   . Diarrhea   . Diverticulitis 06/07/2019  . Depression with anxiety 06/07/2019  . DNR (do not resuscitate) 03/23/2019  . Volume overload 03/23/2019  . Edema 03/23/2019  . Acute renal failure (ARF) ([place]) 03/08/2019  . Hypokalemia 03/08/2019  . Palliative care by specialist   . Goals of care, counseling/discussion   . Altered mental status   . Encephalopathy, toxic 12/24/2018  . Aspiration pneumonia of both lower lobes due to gastric secretions ([place]) 12/24/2018  . Non-ST elevation (NSTEMI) myocardial infarction ([place])   . Chest pain   . Dyspnea on exertion   . Elevated troponin level 12/09/2018  . Chronic migraine 03/27/2018  . Lumbar radiculopathy 03/27/2018  . Gait abnormality 03/27/2018  . CKD (chronic kidney disease) stage 4, GFR 15-29 ml/min (HCC) 12/28/2016  . Symptomatic anemia 12/28/2016  . Chronic pain syndrome 12/28/2016  . Benzodiazepine withdrawal without complication ([place]) 46/80/3212  . Chronic right shoulder pain 12/06/2016  . Fall 09/17/2016  . Fracture of humeral head, closed, right, initial encounter 09/17/2016  . Rib fractures 09/17/2016  . Multiple falls 09/17/2016  . Severe muscle deconditioning 09/17/2016  . Failure to thrive in adult 09/17/2016  . Melena 04/25/2016  . Essential hypertension 04/25/2016  . Anxiety state 04/25/2016  . Tobacco abuse 04/25/2016  . Hyperlipidemia 04/25/2016  . Syncope 04/24/2016  . Syncope and collapse 04/24/2016  . Gait difficulty 01/12/2016  . Foot drop, left 01/12/2016  .  Severe recurrent major depression without psychotic features ([place]) 08/28/2015  . Spondylolisthesis at L4-L5 level 07/30/2015  . PVD (peripheral vascular  disease) with claudication ([place]) 07/29/2014  . Weakness-Bilateral arm/leg 07/29/2014  . Numbness-left leg 07/29/2014  . Swelling of limb-Legs 07/29/2014  . Atherosclerosis of native arteries of the extremities with intermittent claudication 09/30/2011    Palliative Care Assessment & Plan   Patient Profile: Alyssa [name] is a 79 y.o. female with multiple medical problems including CKD stage IV with solitary kidney, PVD, hypertension, anemia, early dementia, history of SAH managed conservatively, who was most recently hospitalized 11/01/2019-11/04/2019 with pneumonia. Patient is now admitted 01/02/2020 with questionable syncope after being found on the floor at home.  Work-up revealed acute on chronic kidney disease with metabolic acidosis.  Palliative care was consulted to help address goals and clarify CODE STATUS.  Assessment: Acute on CKD stage IV Essential HTN Syncope PVD Mild dementia Severe protein calorie malnutrition Weakness  Recommendations/Plan: Continue current plan of care and medical management.  Discharge to SNF rehab. Likely today. No family at bedside. Further AD/code status discussions recommended when son available. May benefit from MOST form. Patient considering limitations to care/code status change. May benefit from outpatient palliative referral for ongoing [place] discussions and MOST form completion.   Code Status: FULL   Code Status Orders  (From admission, onward)         Start     Ordered   01/02/20 2300  Full code  Continuous     01/02/20 2259        Code Status History    Date Active Date Inactive Code Status Order ID Comments User Context   11/01/2019 0353 11/04/2019 2028 DNR [id number]  Rise Patience, MD ED   09/17/2019 2155 09/22/2019 1934 DNR [id number]  Rise Patience, MD ED   09/17/2019 2146 09/17/2019 2155 DNR [id number]  Rise Patience, MD ED   06/07/2019 0900 06/16/2019 2012 DNR [id number]  Karmen Bongo, MD Inpatient    03/23/2019 0210 03/23/2019 1840 DNR [id number]  Roney Jaffe, MD Inpatient   03/08/2019 1819 03/12/2019 1741 Full Code [id number]  Donne Hazel, MD ED   12/31/2018 1400 01/09/2019 1450 DNR [id number]  Micheline Rough, MD Inpatient   12/24/2018 1641 12/31/2018 1400 Full Code [id number]  [place], [place], DO ED   12/10/2018 1837 12/13/2018 1920 DNR [id number]  Adrian Prows, MD Inpatient   12/09/2018 1647 12/10/2018 1836 Full Code [id number]  Truett Mainland, DO Inpatient   06/23/2017 1811 06/24/2017 1915 Full Code [id number]  Doreatha Lew, MD ED   06/14/2017 1209 06/14/2017 1824 Full Code [id number]  Nigel Mormon, MD Inpatient   12/28/2016 0016 12/29/2016 2137 Full Code [id number]  Edwin Dada, MD ED   09/17/2016 2114 09/20/2016 2340 Full Code [id number]  Roney Jaffe, MD Inpatient   04/24/2016 2321 04/26/2016 1845 Full Code [id number]  Norval Morton, MD ED   08/27/2015 2101 08/30/2015 1456 Full Code [id number]  Harvel Quale, MD ED   08/26/2015 1802 08/27/2015 0345 Full Code [id number]  Voncille Lo, MD ED   08/01/2015 1727 08/04/2015 1656 Full Code [id number]  Kary Kos, MD Inpatient   07/30/2015 1848 08/01/2015 1727 Full Code [id number]  Kary Kos, MD ED   Advance Care Planning Activity      Prognosis:  Unable to determine  Discharge Planning: [place] for rehab with Palliative care service follow-up  Care plan was discussed with patient, RN  Thank you for allowing the Palliative Medicine  Team to assist in the care of this patient.   Time In: 1025 Time Out: 1100 Total Time 35 Prolonged Time Billed no      Greater than 50%  of this time was spent counseling and coordinating care related to the above assessment and plan.  Ihor Dow, DNP, FNP-C Palliative Medicine Team  Phone: [phone] Fax: [phone]  Please contact Palliative Medicine Team phone at [phone] for questions and concerns.

## 2020-01-08 NOTE — Progress Notes (Signed)
Report called to [facility], spoke to [place], [place].

## 2020-01-08 NOTE — TOC Transition Note (Signed)
Transition of Care [facility]) - CM/SW Discharge Note   Patient Details  Name: Alyssa [name] MRN: [id number] Date of Birth: [birth year]  Transition of Care [facility]) CM/SW Contact:  Gabrielle Dare Phone Number: 01/08/2020, 2:37 PM   Clinical Narrative:    Patient will Discharge To: [facility]  Anticipated DC Date: 01/08/20 Family Notified: yes, son Jakki Doughty [phone] Transport By: Corey Harold   Per MD patient ready for DC to [facility], patient, patient's family, and facility notified of DC. Assessment, Fl2/Pasrr, and Discharge Summary sent to facility. RN given number for report [phone], Room # Rosa Sanchez). DC packet on chart. Ambulance transport requested for patient for 4:00pm  CSW signing off.  Reed Breech LCSWA [phone]     Final next level of care: Skilled Nursing Facility Barriers to Discharge: No Barriers Identified   Patient Goals and CMS Choice Patient states their goals for this hospitalization and ongoing recovery are:: to go to rehab before she goes home      Discharge Placement              Patient [facility] Patient to be transferred to facility by: [place] Name of family member notified: Smitty Pluck Patient and family notified of of transfer: 01/08/20  Discharge Plan and Services                                     Social Determinants of Health (SDOH) Interventions     Readmission Risk Interventions Readmission Risk Prevention Plan 12/26/2018  Transportation Screening Complete  Medication Review ([place]) Complete  PCP or Specialist appointment within 3-5 days of discharge Not Complete  PCP/Specialist Appt Not Complete comments pt for snf. Snf MD to see as needed.  [place] or Home Care Consult Not Complete  HRI or Home Care Consult Pt Refusal Comments NA  SW Recovery Care/Counseling Consult Not Complete  SW Consult Not Complete Comments NA  Palliative Care Screening Complete  Skilled  Nursing Facility Complete  Some recent data might be hidden

## 2020-01-08 NOTE — Progress Notes (Signed)
Nutrition Follow-up  DOCUMENTATION CODES:   Severe malnutrition in context of chronic illness  INTERVENTION:  Continue Ensure Enlive po TID, each supplement provides 350 kcal and 20 grams of protein  Continue snacks TID  Continue MVI daily    NUTRITION DIAGNOSIS:   Severe Malnutrition related to chronic illness(dementia) as evidenced by percent weight loss, severe muscle depletion, severe fat depletion.  Ongoing.  GOAL:   Patient will meet greater than or equal to 90% of their needs  Progressing.  MONITOR:   PO intake, Supplement acceptance, Weight trends, Labs  REASON FOR ASSESSMENT:   Consult Assessment of nutrition requirement/status  ASSESSMENT:   Pt was admitted with AKI on CKD stage IV complicated by hyperkalemia and syncope. PMH includes CKD stage IV, PVD, HTN, anemia, hx of SAH, and dementia.  Discussed pt with RN.   Pt reports appetite is "okay" and that she is consuming Ensure and snacks.   PO Intake: 45-100% x 4 recorded meals  Medications reviewed and include: Ensure Enlive TID, Ferrous sulfate, MVI, Sodium bicarbonate  Labs reviewed: K+ 5.6 (H)  Diet Order:   Diet Order            Diet regular Room service appropriate? Yes; Fluid consistency: Thin  Diet effective now              EDUCATION NEEDS:   No education needs have been identified at this time  Skin:  Skin Assessment: Reviewed RN Assessment  Last BM:  3/22  Height:   Ht Readings from Last 1 Encounters:  01/02/20 5\' 3"  (1.6 m)    Weight:   Wt Readings from Last 1 Encounters:  01/07/20 44.8 kg    BMI:  Body mass index is 17.5 kg/m.  Estimated Nutritional Needs:   Kcal:  1400-1600  Protein:  70-80 grams  Fluid:  >1.4L   Larkin Ina, MS, RD, LDN RD pager number and weekend/on-call pager number located in [place].

## 2020-01-08 NOTE — Progress Notes (Signed)
Physical Therapy Treatment Patient Details Name: Alyssa [name] MRN: [id number] DOB: 1940/11/06 Today's Date: 01/08/2020    History of Present Illness Pt is a 79 y/o female admitted secondary to syncope and chest pressure. Found to have AKI. PMH includes PVD, HTN, CKD, dementia, and R THA.     PT Comments    Functionally did well with mobility but had incident of incontinence was limited in amount of activity completed. Pt needed min a with bed mob and transfers, able to transfer from bed to [facility] and to recliner all with set up and min a. Pt was also able to sit edge of bed unsupported with no LOB and also on BSC with no LOB. Attempted ambulation multi times but each time pt began having uncontrollable incontinent episode.     Follow Up Recommendations  SNF;Supervision/Assistance - 24 hour     Equipment Recommendations  Rolling walker with 5" wheels    Recommendations for Other Services       Precautions / Restrictions Precautions Precautions: Fall Precaution Comments: incontinent of bowel this session Other Brace: no    Mobility  Bed Mobility Overal bed mobility: Needs Assistance Bed Mobility: Supine to Sit     Supine to sit: Supervision        Transfers Overall transfer level: Needs assistance Equipment used: Rolling walker (2 wheeled) Transfers: Sit to/from [facility] Sit to Stand: Min assist;Min guard Stand pivot transfers: Min assist;Min guard       General transfer comment: neesd cues for safety/ hand placement on walker   Ambulation/Gait Ambulation/Gait assistance: Min assist Gait Distance (Feet): 5 Feet Assistive device: Rolling walker (2 wheeled) Gait Pattern/deviations: Step-through pattern;Decreased stride length     General Gait Details: initiated ambulation multi times but each time pt had episode of bowel incontinence, states was completely unaware it was happening   Chief Strategy Officer     Modified Rankin (Stroke Patients Only)       Balance Overall balance assessment: Needs assistance Sitting-balance support: Feet supported Sitting balance-Leahy Scale: Fair Sitting balance - Comments: able to sit unsupported w/o LOB at edge of bed and on BSC   Standing balance support: During functional activity;Bilateral upper extremity supported Standing balance-Leahy Scale: Poor Standing balance comment: needs UE support to maintain                            Cognition Arousal/Alertness: Awake/alert Behavior During Therapy: Anxious;WFL for tasks assessed/performed Overall Cognitive Status: History of cognitive impairments - at baseline                                 General Comments: seems to be at or close to baseline      Exercises      General Comments        Pertinent Vitals/Pain Pain Assessment: Faces Faces Pain Scale: No hurt    Home Living                      Prior Function            PT Goals (current goals can now be found in the care plan section) Acute Rehab PT Goals Patient Stated Goal: to get better PT Goal Formulation: With patient Time For Goal Achievement: 01/17/20 Potential to Achieve Goals: Good Progress  towards PT goals: Progressing toward goals    Frequency    Min 2X/week      PT Plan Current plan remains appropriate    Co-evaluation              AM-PAC PT "6 Clicks" Mobility   Outcome Measure  Help needed turning from your back to your side while in a flat bed without using bedrails?: None Help needed moving from lying on your back to sitting on the side of a flat bed without using bedrails?: A Little Help needed moving to and from a bed to a chair (including a wheelchair)?: A Little Help needed standing up from a chair using your arms (e.g., wheelchair or bedside chair)?: A Little Help needed to walk in hospital room?: A Lot Help needed climbing 3-5 steps with a railing? :  Total 6 Click Score: 16    End of Session Equipment Utilized During Treatment: Gait belt Activity Tolerance: Patient tolerated treatment well Patient left: in chair;with call bell/phone within reach   PT Visit Diagnosis: Unsteadiness on feet (R26.81);Muscle weakness (generalized) (M62.81)     Time: 8118-8677 PT Time Calculation (min) (ACUTE ONLY): 28 min  Charges:  $Therapeutic Activity: 23-37 mins                     Horald Chestnut, PT    Delford Field 01/08/2020, 1:19 PM

## 2020-01-08 NOTE — TOC Progression Note (Addendum)
Transition of Care [facility]) - Progression Note    Patient Details  Name: Alyssa [name] MRN: [id number] Date of Birth: 01-Dec-1940  Transition of Care [facility]) CM/SW [place], [place] Phone Number: 01/08/2020, 1:04 PM  Clinical Narrative:    CSW followed up with [facility] for disposition.  Facility contact Freda Munro reached out to [facility] to received reference # for [employer].  CSW received insurance auth for disposition # [id number].  CSW will continue to follow for disposition.   Expected Discharge Plan: Skilled Nursing Facility Barriers to Discharge: Continued Medical Work up  Expected Discharge Plan and Services Expected Discharge Plan: [place]         Expected Discharge Date: 01/08/20                                     Social Determinants of Health (SDOH) Interventions    Readmission Risk Interventions Readmission Risk Prevention Plan 12/26/2018  Transportation Screening Complete  Medication Review Press photographer) Complete  PCP or Specialist appointment within 3-5 days of discharge Not Complete  PCP/Specialist Appt Not Complete comments pt for snf. Snf MD to see as needed.  [place] or Home Care Consult Not Complete  HRI or Home Care Consult Pt Refusal Comments NA  SW Recovery Care/Counseling Consult Not Complete  SW Consult Not Complete Comments NA  Palliative Care Screening Complete  Skilled Nursing Facility Complete  Some recent data might be hidden

## 2020-01-17 IMAGING — DX DG CHEST 2V
2 series · 2 of 2 positions shown · non-contrast
Comparison: Chest x-ray dated September 18, 2017.

CLINICAL DATA: Chest pain.

EXAM:
CHEST - 2 VIEW

[chest ap]
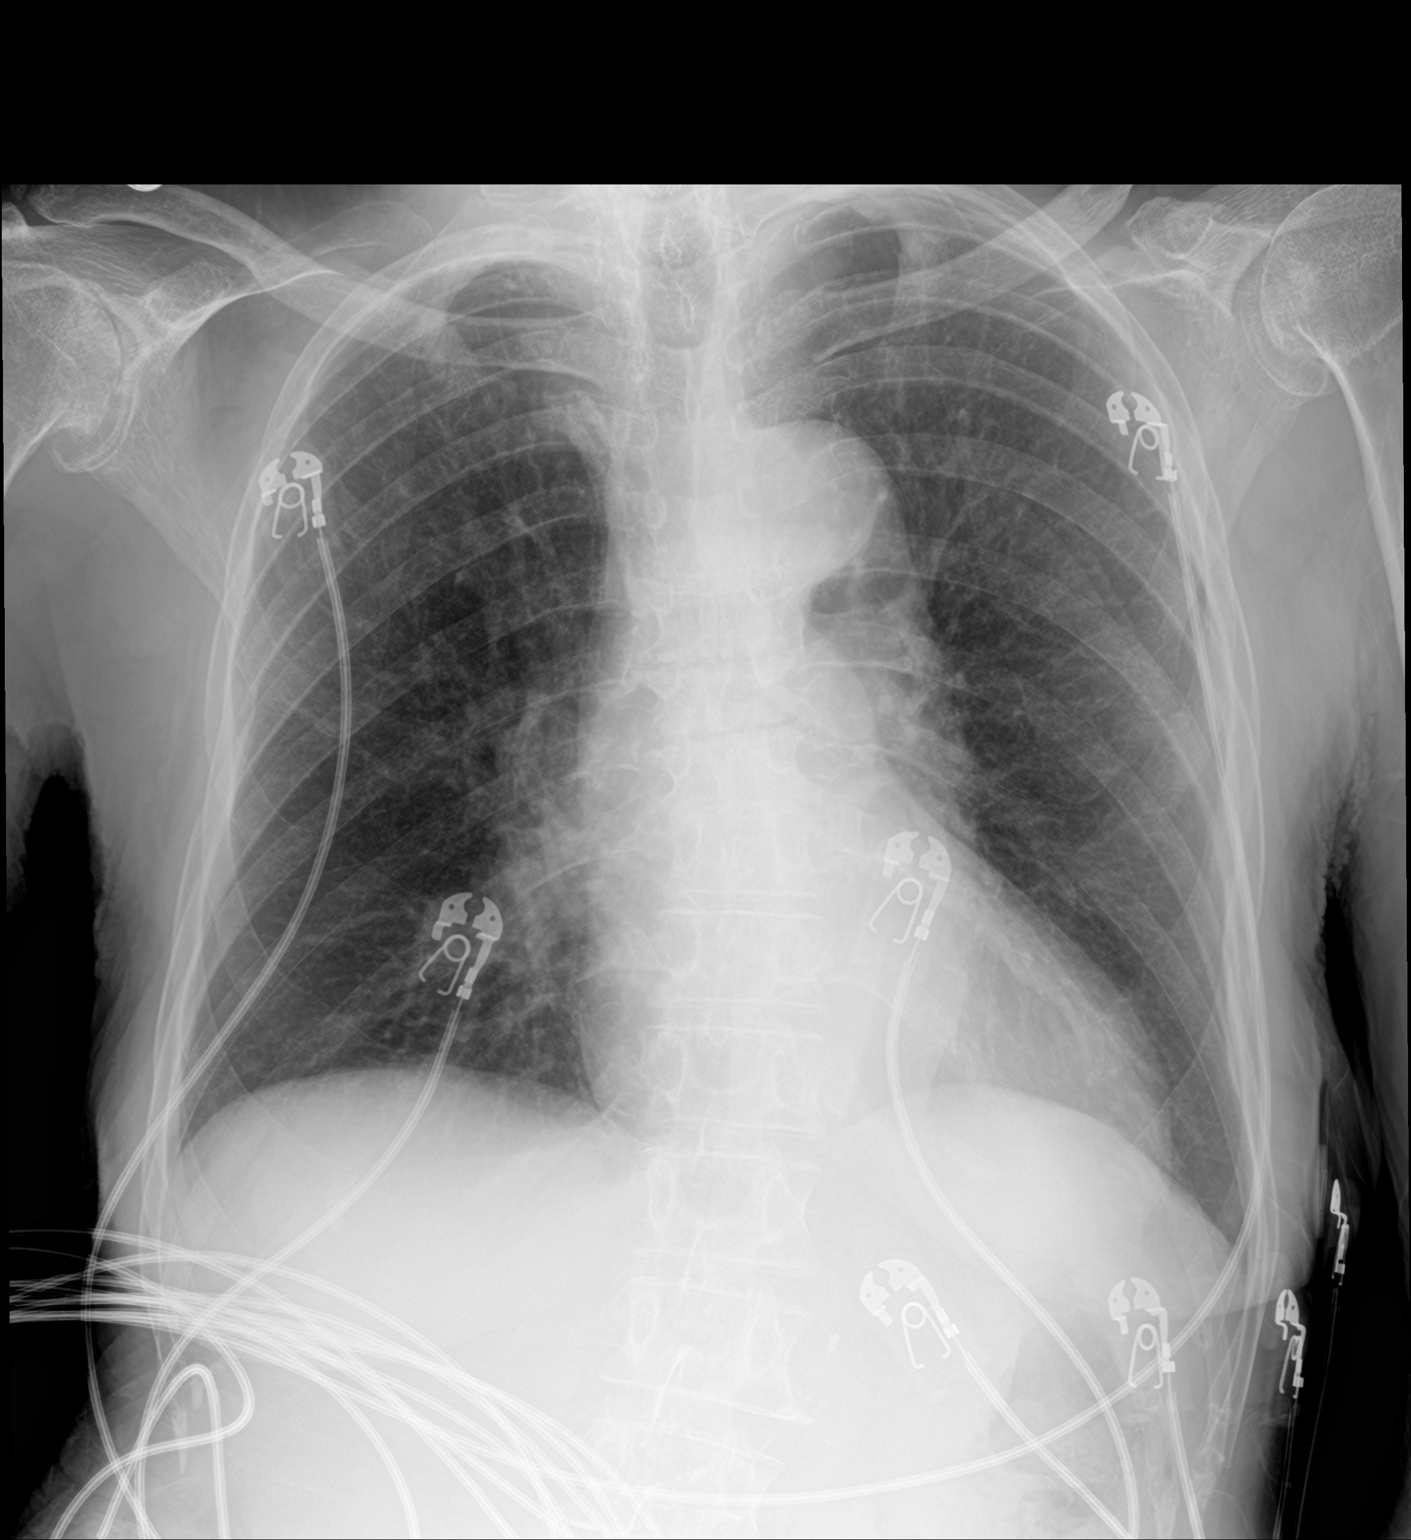

[chest lat]
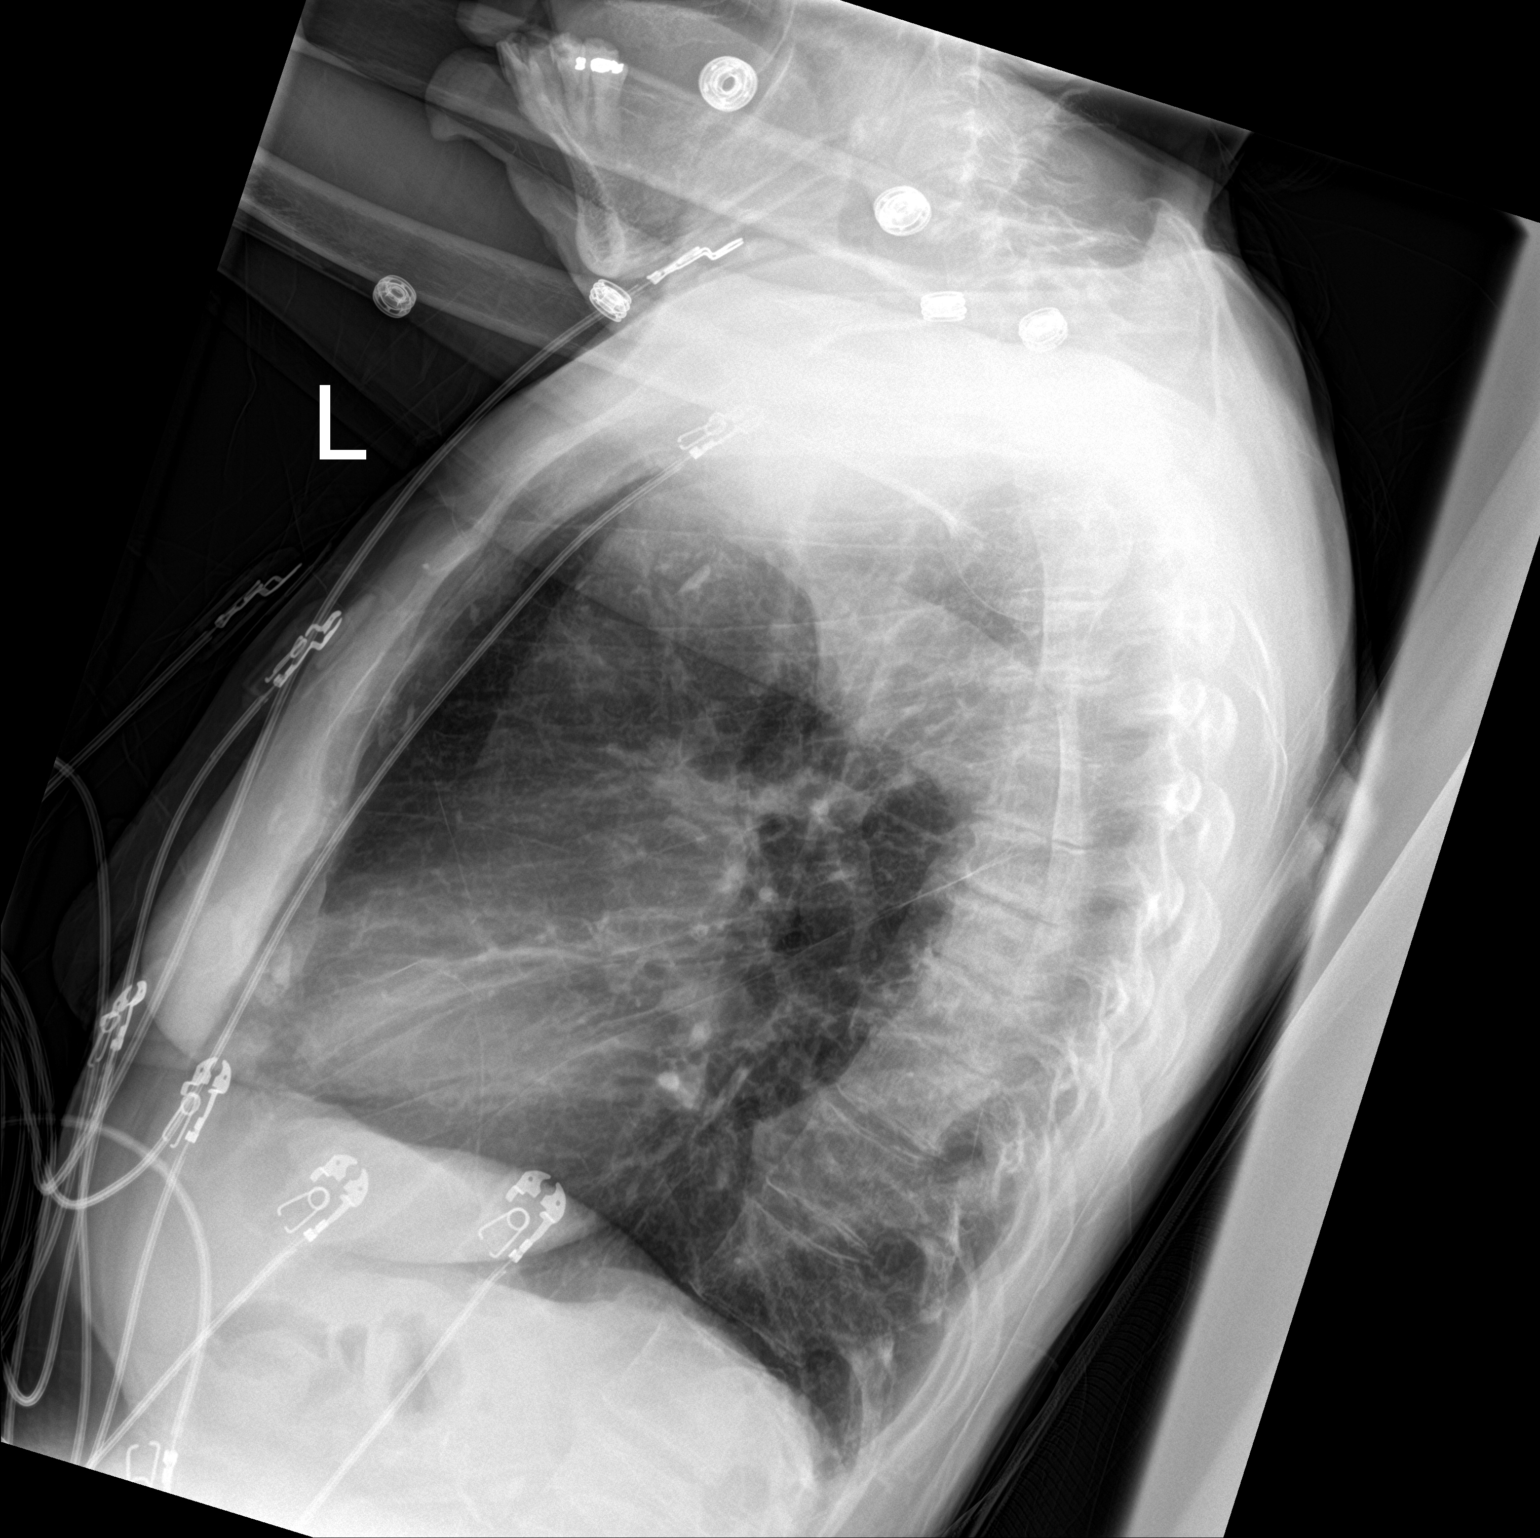

[2 of 2 positions shown; findings below may reference images not displayed]

FINDINGS: The heart size and mediastinal contours are within normal limits.
Normal pulmonary vascularity. Lungs remain hyperinflated. No focal
consolidation, pleural effusion, or pneumothorax. No acute osseous
abnormality.
IMPRESSION: No active cardiopulmonary disease.

## 2020-01-18 IMAGING — DX DG ABDOMEN 1V
1 series · 1 of 1 positions shown · non-contrast
Comparison: None.

CLINICAL DATA: Emesis.

EXAM:
ABDOMEN - 1 VIEW

[abdomen kub]
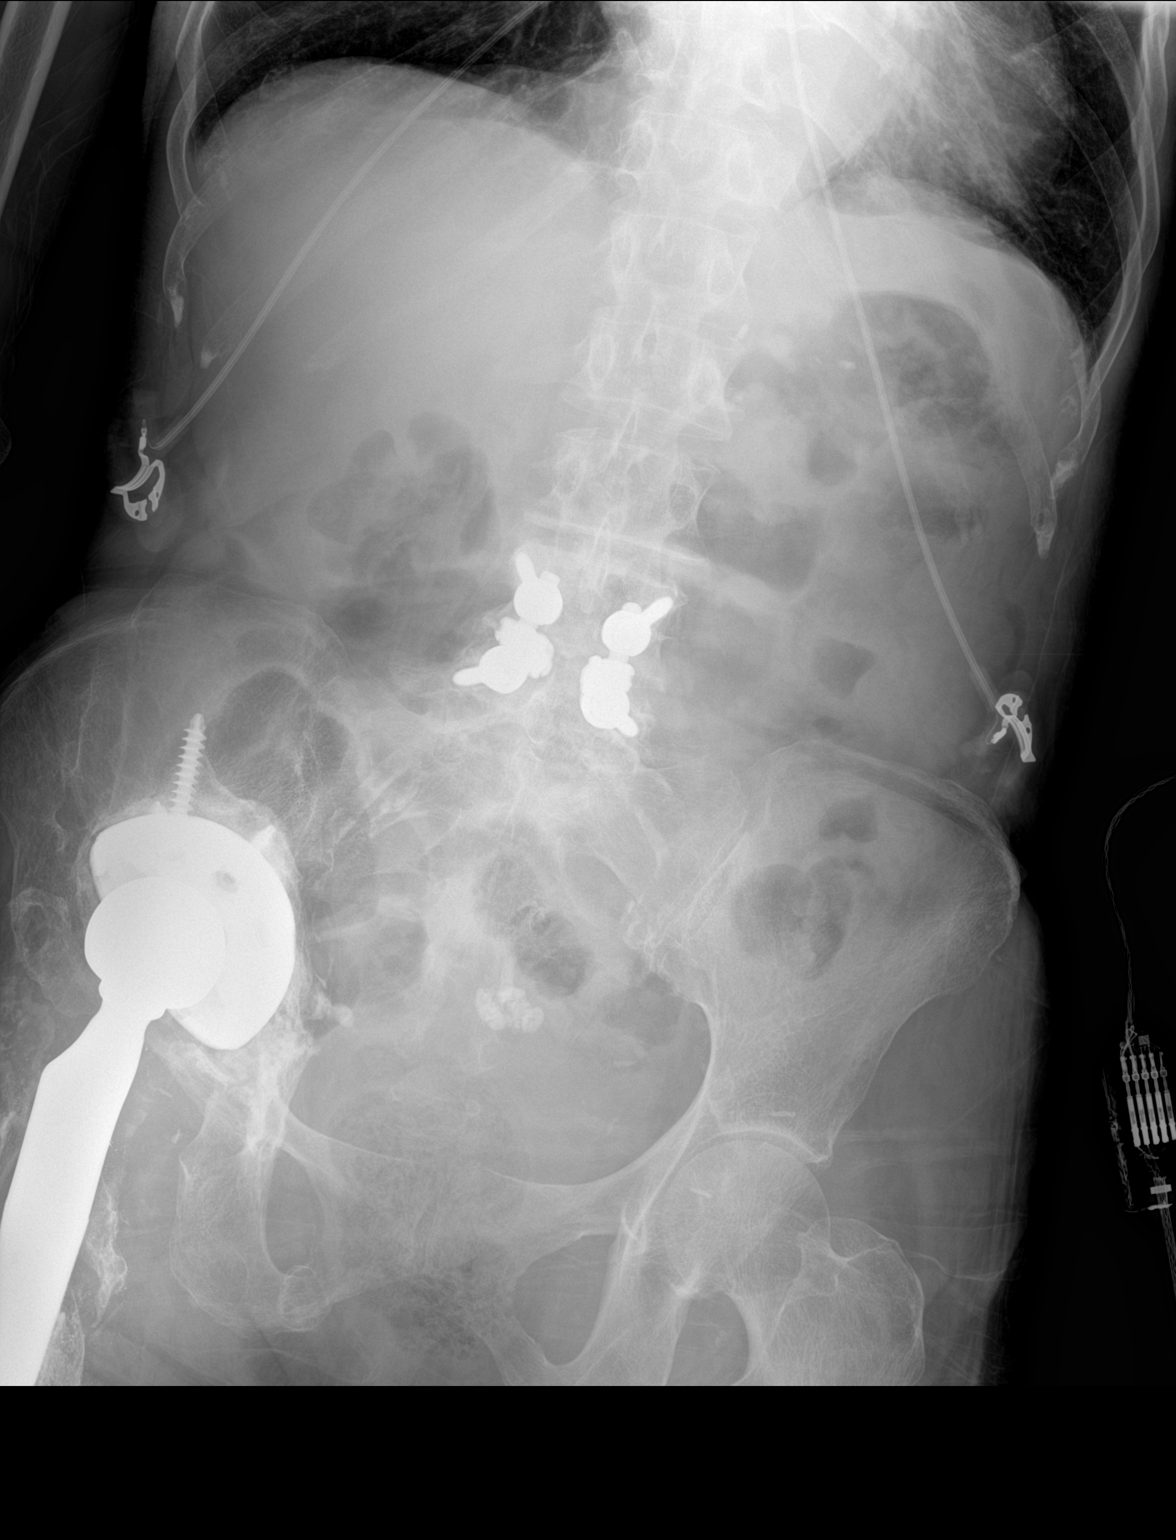

[1 of 1 positions shown; findings below may reference images not displayed]

FINDINGS: Supine abdomen shows no gaseous bowel dilatation to suggest overt
obstruction. Air is seen scattered along segments of the colon.
Status post lumbar fusion. Right hip replacement noted. Coarse
calcification over the central pelvis likely related to fibroid
disease.
IMPRESSION: Nonspecific bowel gas pattern.

## 2020-01-18 IMAGING — NM NM PULMONARY VENT & PERF
16 series · 16 of 16 positions shown · non-contrast
Comparison: Chest radiograph 12/09/2018

CLINICAL DATA: Left substernal chest pain and shortness of breath.

EXAM:
NUCLEAR MEDICINE VENTILATION - PERFUSION LUNG SCAN
TECHNIQUE: Ventilation images were obtained in multiple projections using
inhaled aerosol Vc-XXm DTPA. Perfusion images were obtained in
multiple projections after intravenous injection of Vc-XXm MAA.
RADIOPHARMACEUTICALS:  31.8 mCi of Vc-XXm DTPA aerosol inhalation
and 4.37 mCi UcJJm MAA IV

[Series 1: ant/post vent · 4.14mm/px · 1 of 1 slices shown (1 of 2)]
[im 1/1]
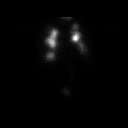

[Series 1: ant/post vent · 4.14mm/px · 1 of 1 slices shown (2 of 2)]
[im 1/1]
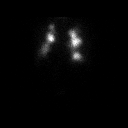

[Series 2: lao/rpo vent · 4.14mm/px · 1 of 1 slices shown (1 of 2)]
[im 1/1]
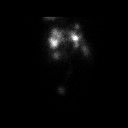

[Series 2: lao/rpo vent · 4.14mm/px · 1 of 1 slices shown (2 of 2)]
[im 1/1]
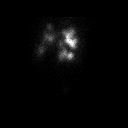

[Series 3: lpo/rao vent · 4.14mm/px · 1 of 1 slices shown (1 of 2)]
[im 1/1]
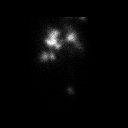

[Series 3: lpo/rao vent · 4.14mm/px · 1 of 1 slices shown (2 of 2)]
[im 1/1]
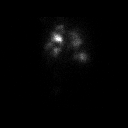

[Series 4: lt lat/rt lat vent · 4.14mm/px · 1 of 1 slices shown (1 of 2)]
[im 1/1]
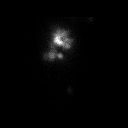

[Series 4: lt lat/rt lat vent · 4.14mm/px · 1 of 1 slices shown (2 of 2)]
[im 1/1]
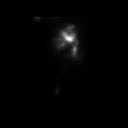

[Series 5: lt lat/rt lat perf · 4.14mm/px · 1 of 1 slices shown (1 of 2)]
[im 1/1]
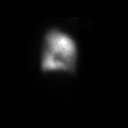

[Series 5: lt lat/rt lat perf · 4.14mm/px · 1 of 1 slices shown (2 of 2)]
[im 1/1]
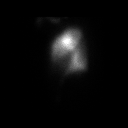

[Series 6: lpo/rao perf · 4.14mm/px · 1 of 1 slices shown (1 of 2)]
[im 1/1]
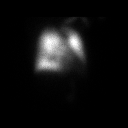

[Series 6: lpo/rao perf · 4.14mm/px · 1 of 1 slices shown (2 of 2)]
[im 1/1]
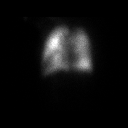

[Series 7: ant/post perf · 4.14mm/px · 1 of 1 slices shown (1 of 2)]
[im 1/1]
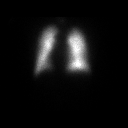

[Series 7: ant/post perf · 4.14mm/px · 1 of 1 slices shown (2 of 2)]
[im 1/1]
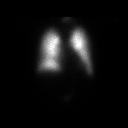

[Series 8: lao/rpo perf · 4.14mm/px · 1 of 1 slices shown (1 of 2)]
[im 1/1]
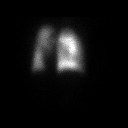

[Series 8: lao/rpo perf · 4.14mm/px · 1 of 1 slices shown (2 of 2)]
[im 1/1]
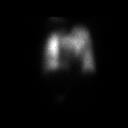

[16 of 16 positions shown; findings below may reference images not displayed]

FINDINGS: Ventilation: Patchy radiotracer distribution throughout the lung
parenchyma with scattered ventilation defects.

Perfusion: There is a wedge shaped peripheral perfusion defects in
the left mid lung, however it is matched with a ventilation defect
with a similar distribution. Therefore, there is a low probability
of this representing a pulmonary embolus, unless it is associated
with atelectatic lung parenchyma.
IMPRESSION: Wedge shaped peripheral perfusion defects in the left mid lung,
which is matched with a ventilation defect with a similar
distribution. Therefore, there is a low probability of this
representing a pulmonary embolus, unless it is associated with
atelectatic lung parenchyma.

Patchy radiotracer distribution throughout the lung parenchyma,
suggestive of COPD.

## 2020-01-18 IMAGING — DX DG CHEST 1V PORT
1 series · 1 of 1 positions shown · non-contrast
Comparison: Chest radiograph from one day prior.

CLINICAL DATA: Hypoxia

EXAM:
PORTABLE CHEST 1 VIEW

[chest ap]
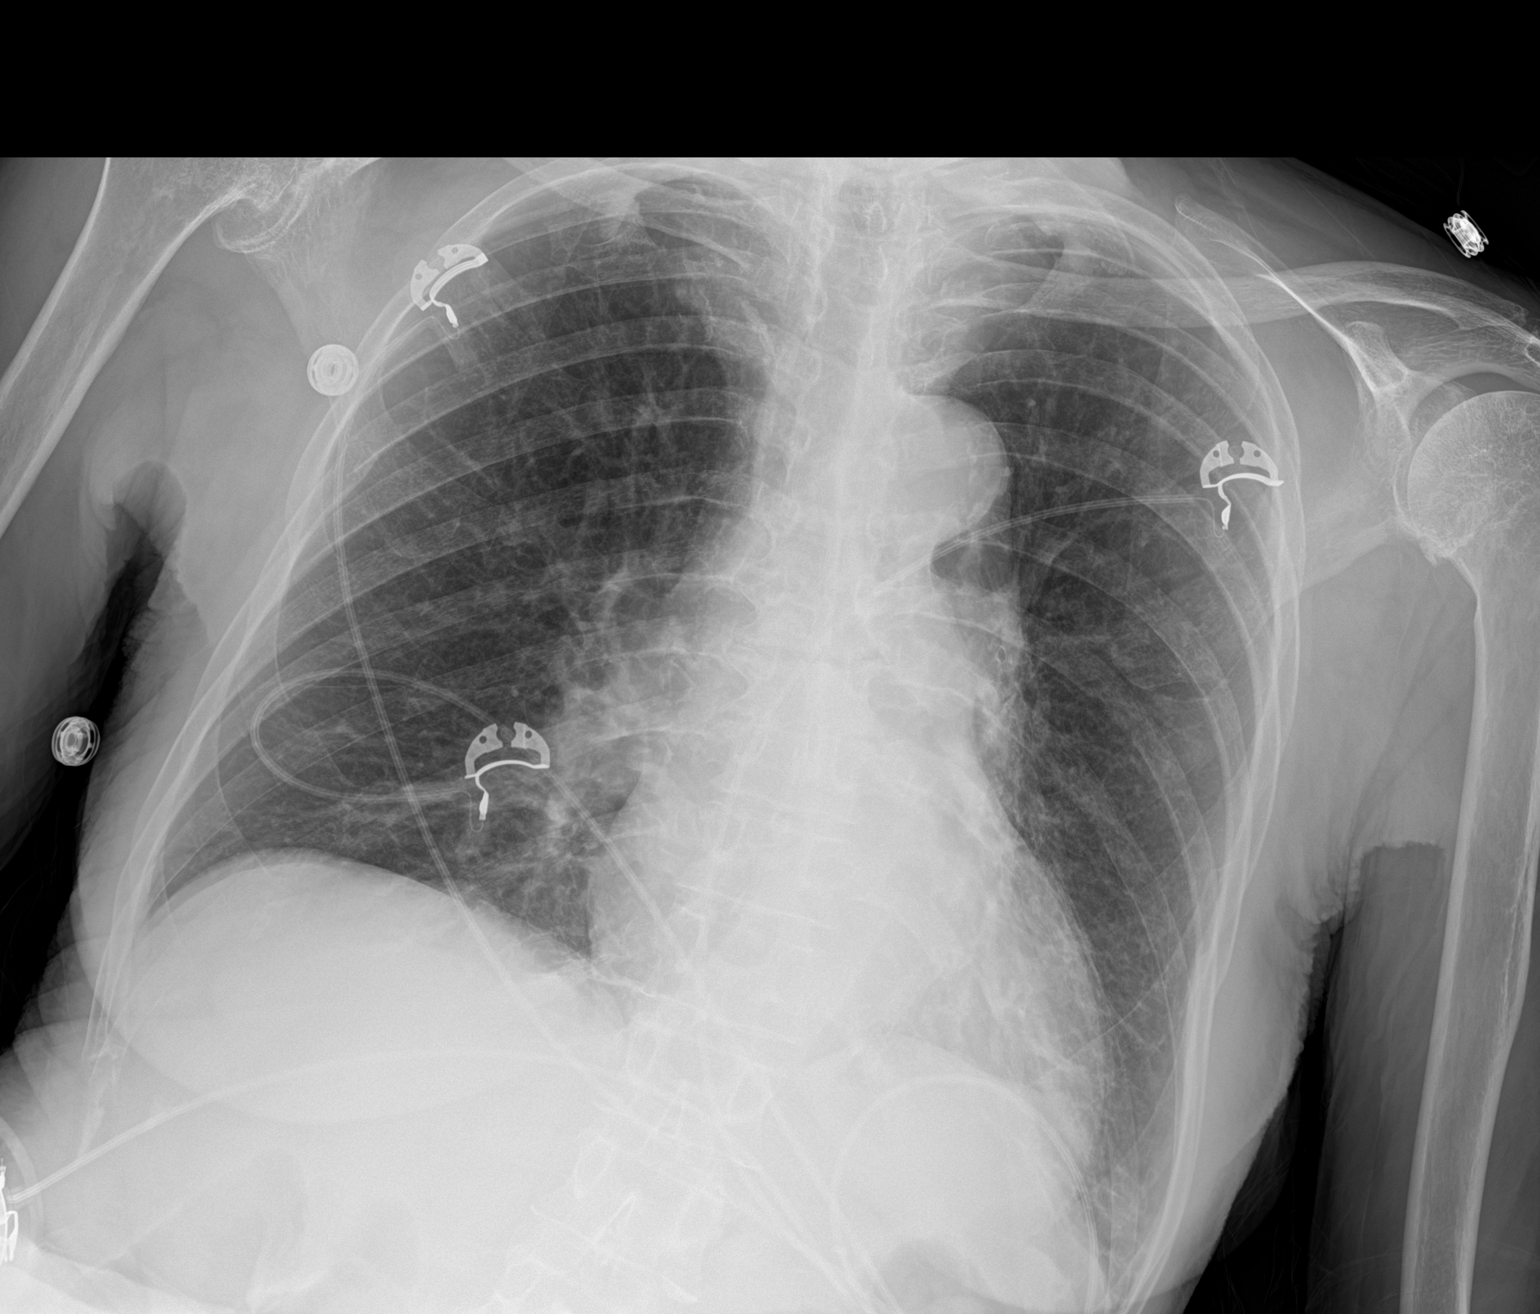

[1 of 1 positions shown; findings below may reference images not displayed]

FINDINGS: Stable cardiomediastinal silhouette with normal heart size. No
pneumothorax. No pleural effusion. No pulmonary edema. Mild hazy
left lung base opacity.
IMPRESSION: Mild hazy left lung base opacity, which could represent atelectasis,
aspiration or pneumonia. Recommend short-term follow-up PA and
lateral chest radiographs.

## 2020-01-19 IMAGING — DX DG CHEST 1V PORT
1 series · 1 of 1 positions shown · non-contrast
Comparison: 12/10/2018

CLINICAL DATA: Increased shortness of breath

EXAM:
PORTABLE CHEST 1 VIEW

[chest]
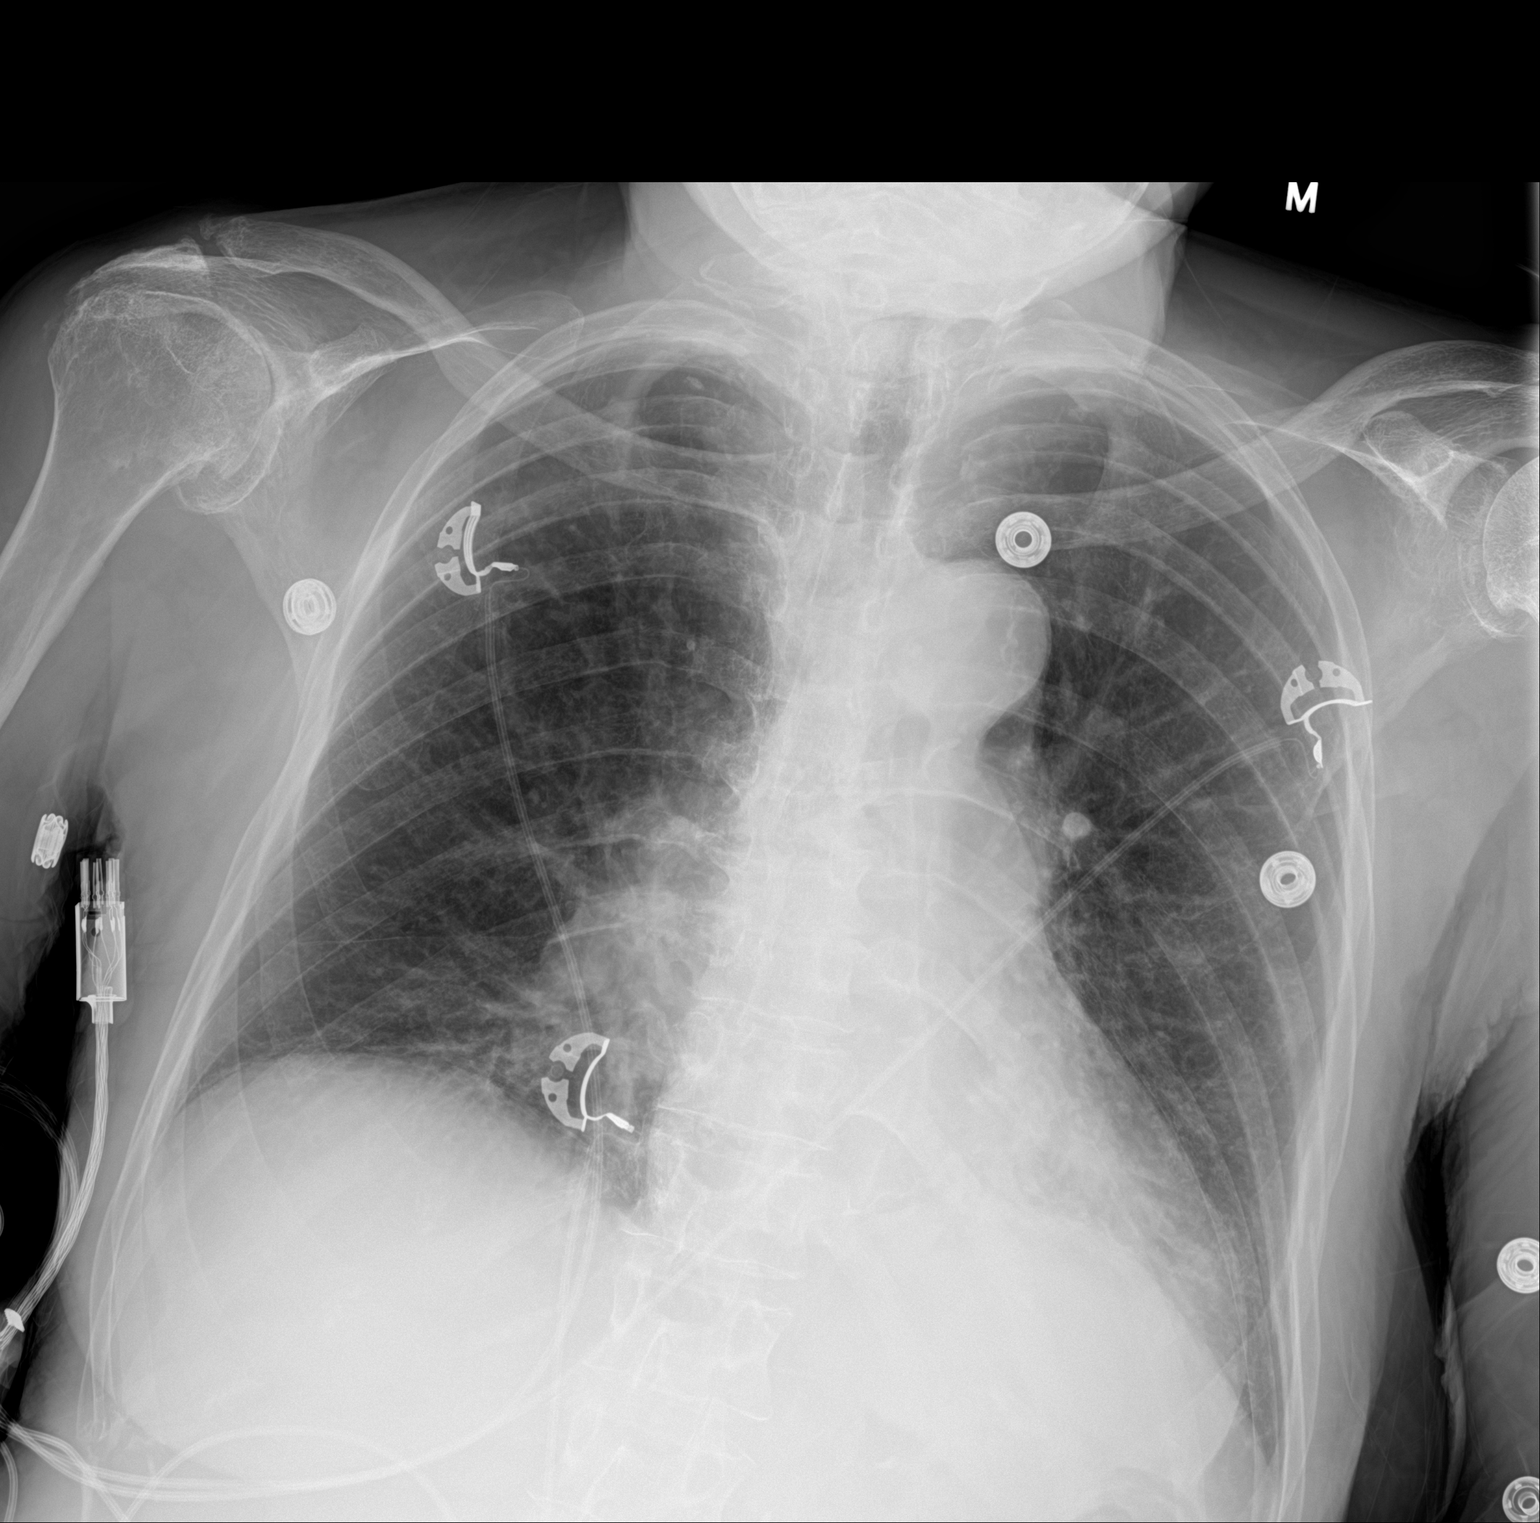

[1 of 1 positions shown; findings below may reference images not displayed]

FINDINGS: There is hazy left lower lobe airspace disease concerning for
atelectasis versus pneumonia. There is no pleural effusion or
pneumothorax. The heart and mediastinal contours are unremarkable.

There is osteoarthritis of bilateral glenohumeral joints.
IMPRESSION: Hazy left lower lobe airspace disease concerning for atelectasis
versus pneumonia.

## 2020-02-01 IMAGING — CT CT CERVICAL SPINE W/O CM
4 of 7 series · 14 of 33 positions shown, 15 images · non-contrast
Comparison: 06/23/2017

CLINICAL DATA: Multiple falls and altered mental status

EXAM:
CT HEAD WITHOUT CONTRAST
CT CERVICAL SPINE WITHOUT CONTRAST
TECHNIQUE: Multidetector CT imaging of the head and cervical spine was
performed following the standard protocol without intravenous
contrast. Multiplanar CT image reconstructions of the cervical spine
were also generated.

[Series 11: c spine soft · axial · 0.26mm/px · z∈[-235,-157]mm · 4 of 67 slices shown]
[im 14/67  soft-tissue]
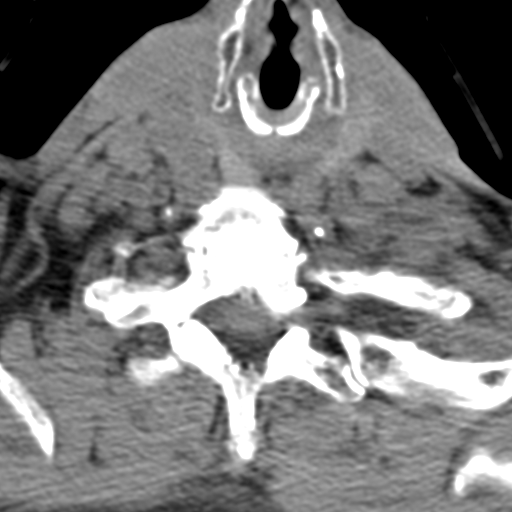
[im 27/67  soft-tissue]
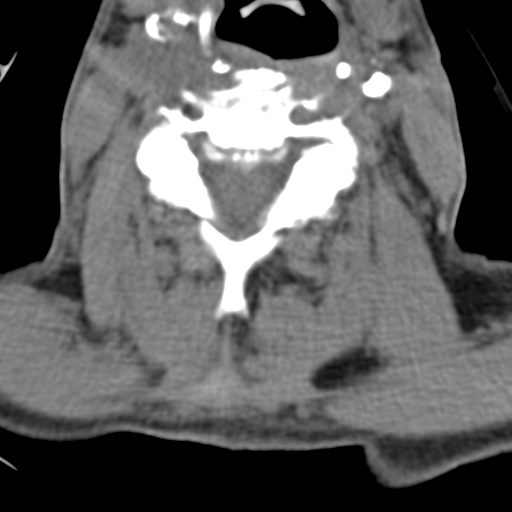
[im 40/67  soft-tissue]
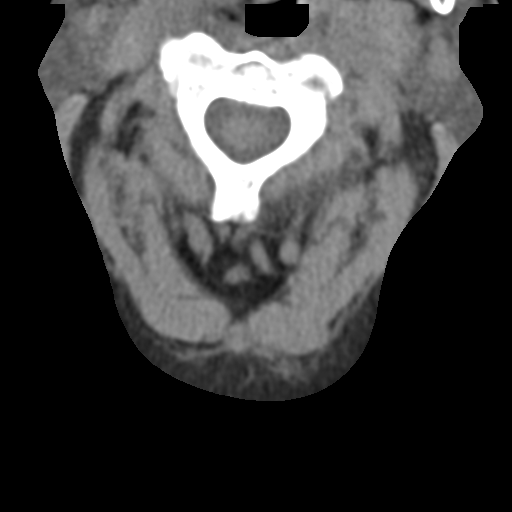
[im 53/67  soft-tissue]
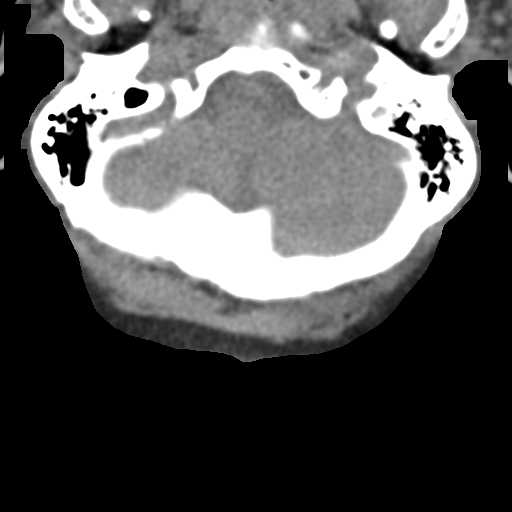

[Series 13: orthogonal bone · axial · 0.23mm/px · z∈[-260,-178]mm · 4 of 74 slices shown, 5 images]
[im 15/74  soft-tissue]
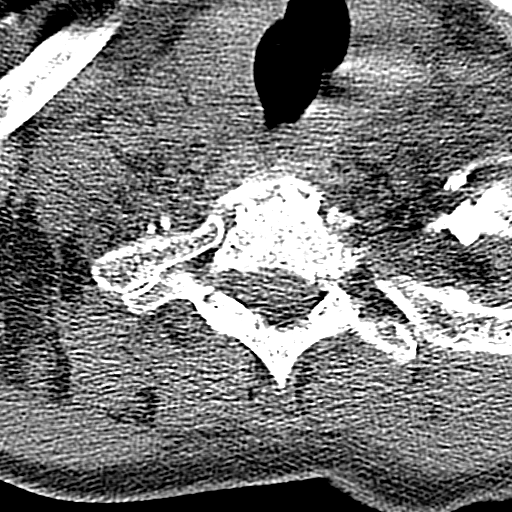
[im 15/74  bone]
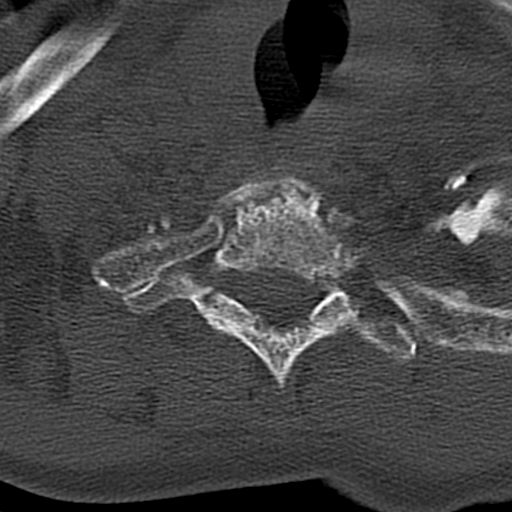
[im 30/74  bone]
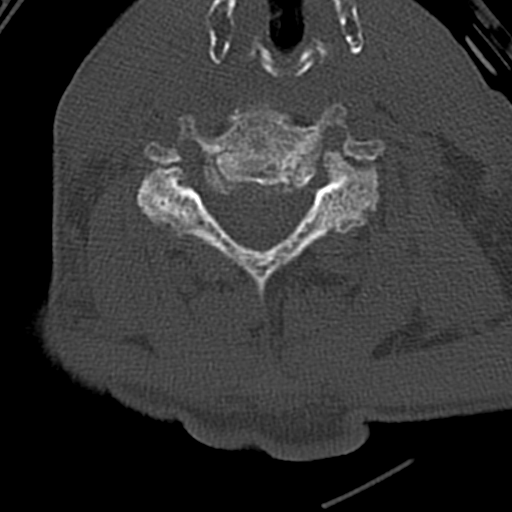
[im 44/74  bone]
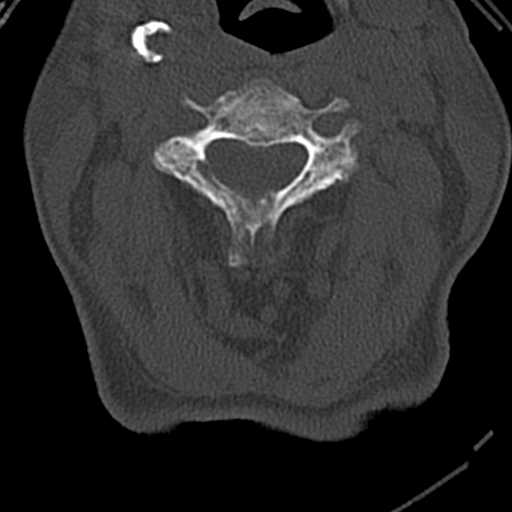
[im 59/74  bone]
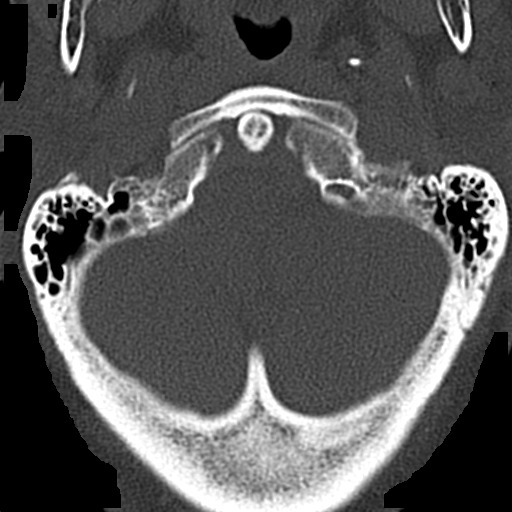

[Series 14: coronal bone · coronal · 0.27mm/px · 1 of 88 slices shown]
[im 44/88  bone]
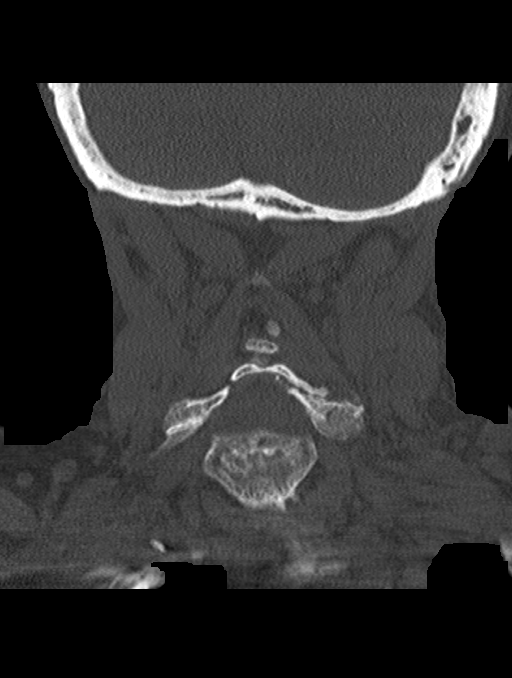

[Series 15: sagittal bone · sagittal · 0.34mm/px · 5 of 69 slices shown]
[im 10/69  bone]
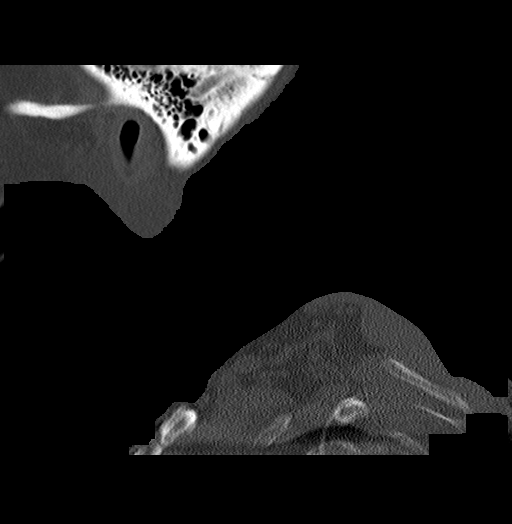
[im 20/69  bone]
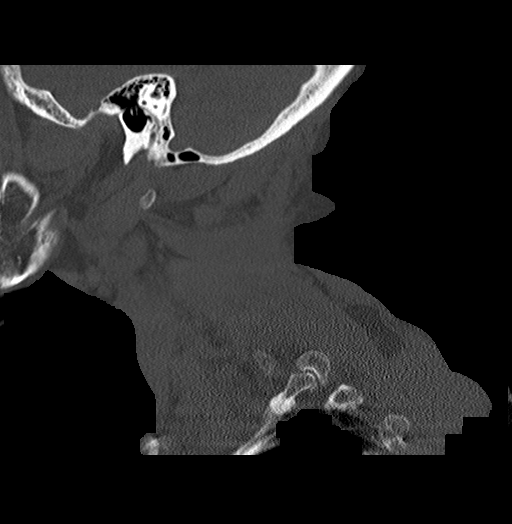
[im 30/69  bone]
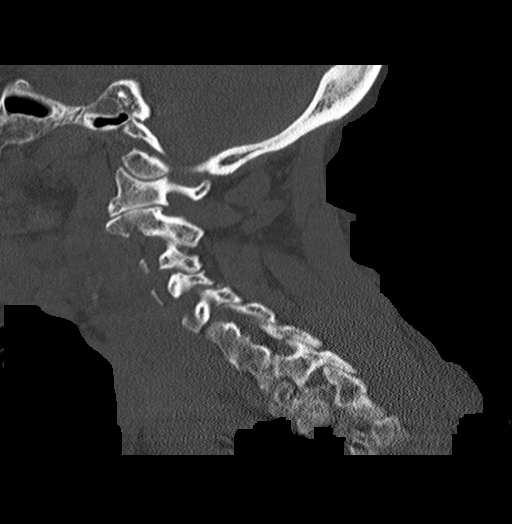
[im 39/69  bone]
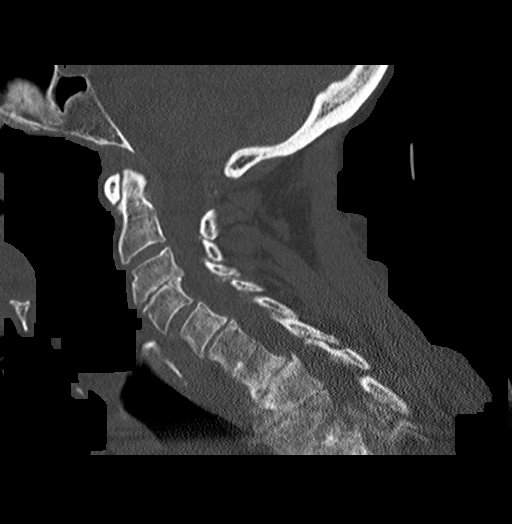
[im 49/69  bone]
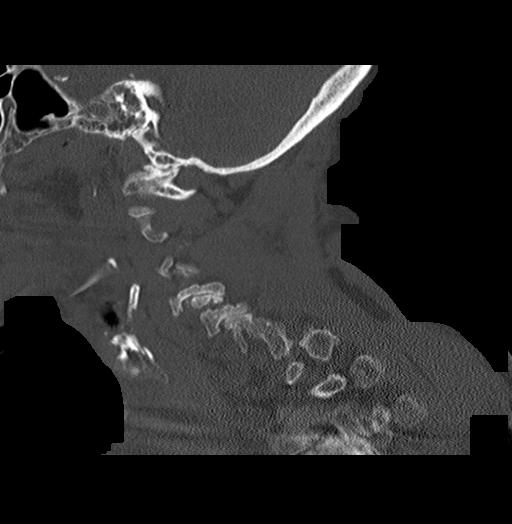

[14 of 33 positions shown; findings below may reference images not displayed]

FINDINGS: CT HEAD FINDINGS

Brain: No evidence of acute infarction, hemorrhage, hydrocephalus,
extra-axial collection or mass lesion/mass effect. Mild borderline
moderate patchy low-density in the cerebral white matter attributed
to chronic small vessel ischemia given vascular risk factors and
stability. Mild for age cerebral volume loss

Vascular: Atherosclerotic calcification

Skull: Negative

Sinuses/Orbits: Postoperative right ethmoid sinuses. Bilateral
cataract resection. No acute finding

CT CERVICAL SPINE FINDINGS

Alignment: Degenerative anterolisthesis at C4-5 and C7-T1 measuring
up to 4 mm.

Skull base and vertebrae: No evidence of acute fracture. Marked
motion degradation at the level of T2 and T3

Soft tissues and spinal canal: No gross canal hematoma or
prevertebral edema. Atherosclerotic calcification.

Disc levels: Generalized disc degeneration particularly severe at
C7-T1 and T1-2 where there is disc collapse and sclerosis. Diffuse
facet arthropathy most advanced at C7-T1 where there is joint
distortion and anterolisthesis. Ankylosis has occurred at C6-7.

Upper chest: Emphysema with motion.
IMPRESSION: No evidence of acute intracranial or cervical spine injury.

## 2020-02-01 IMAGING — DX DG CHEST 1V PORT
1 series · 1 of 1 positions shown · non-contrast
Comparison: 12/11/2018

CLINICAL DATA: Multiple falls and altered mental status.

EXAM:
PORTABLE CHEST 1 VIEW

[chest ap]
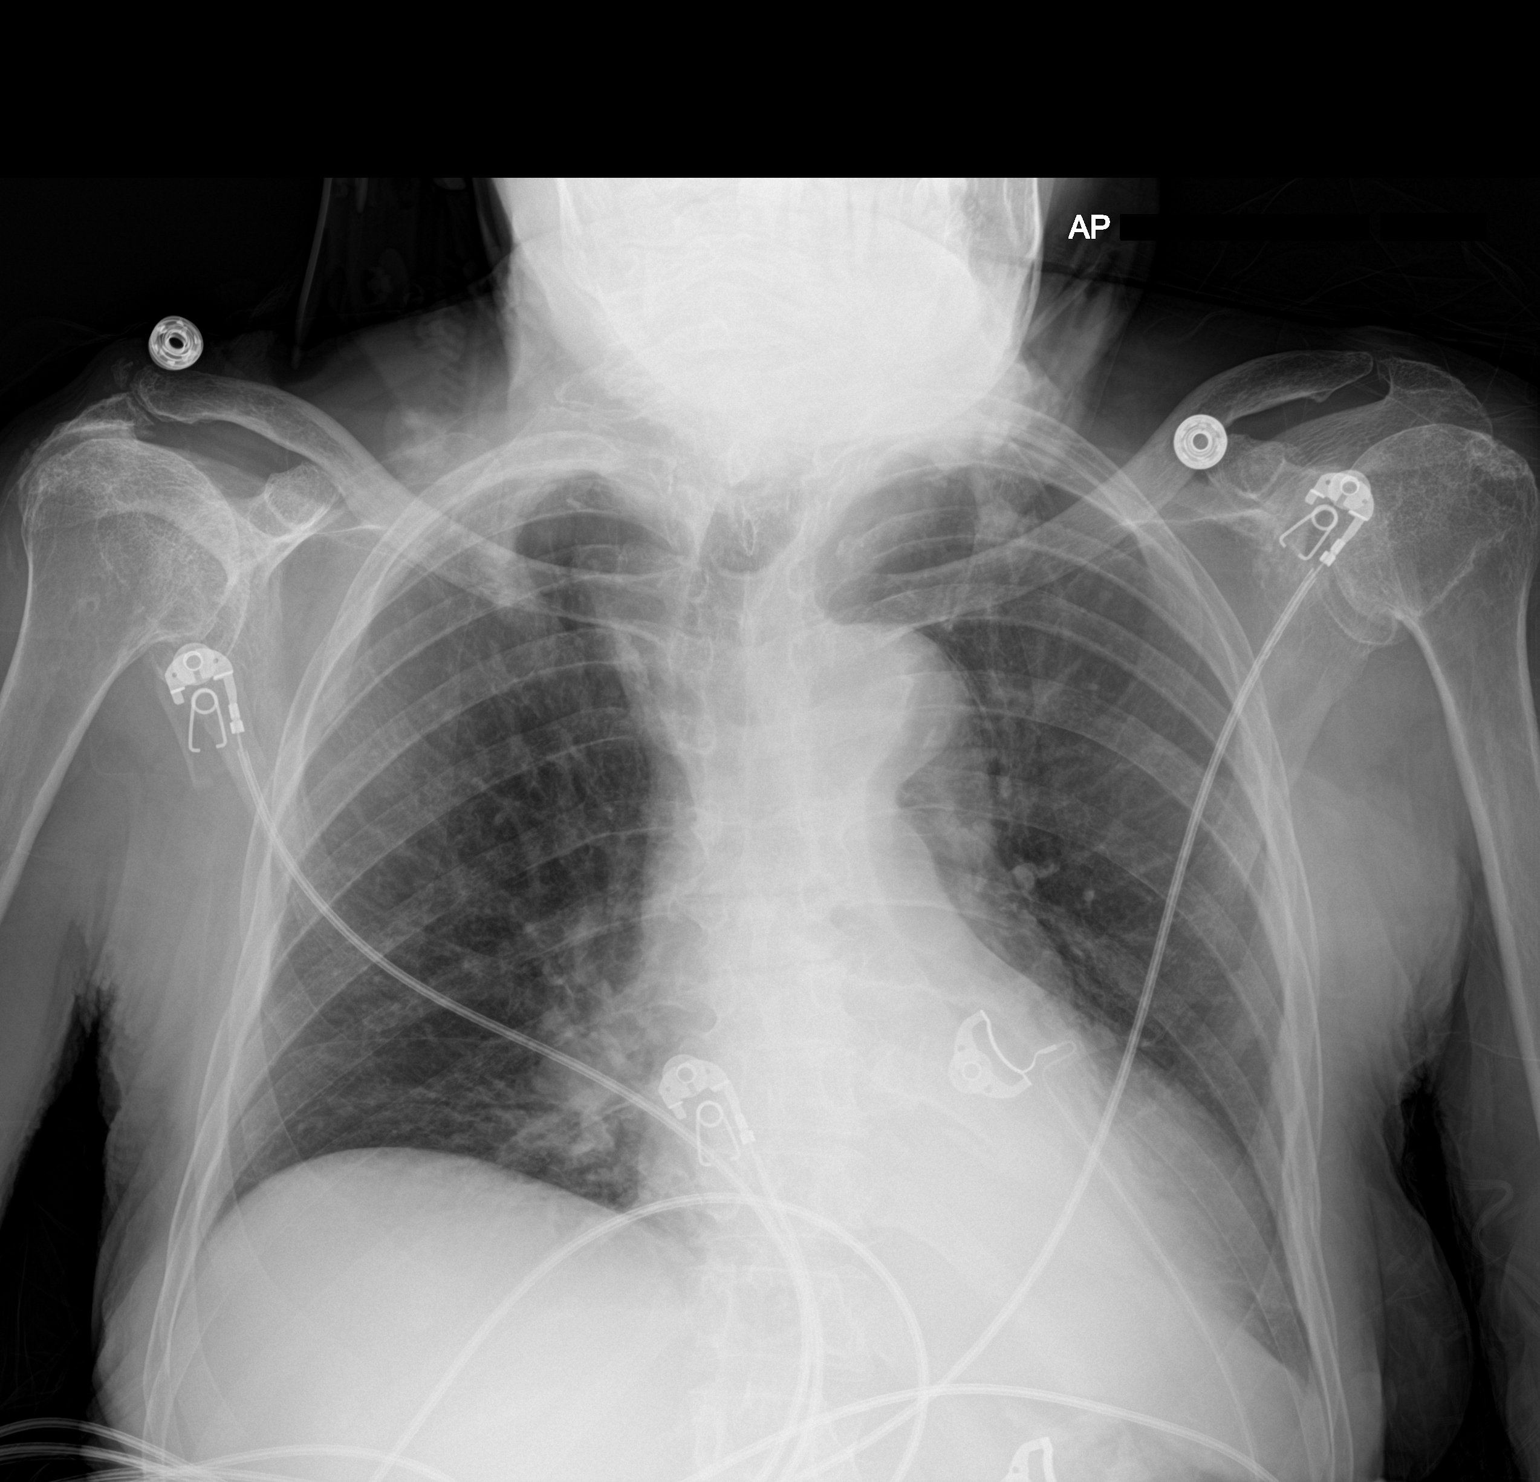

[1 of 1 positions shown; findings below may reference images not displayed]

FINDINGS: Midline trachea. Cardiomegaly accentuated by AP portable technique.
No pleural effusion or pneumothorax. Increase in left base airspace
disease. High-riding humeral heads.
IMPRESSION: 1. Increase in left base airspace disease, suspicious for infection
or aspiration.
2. Cardiomegaly without congestive failure.

## 2020-02-01 IMAGING — CT CT CERVICAL SPINE W/O CM
1 series · 1 of 1 positions shown · non-contrast
Comparison: 06/23/2017

CLINICAL DATA: Multiple falls and altered mental status

EXAM:
CT HEAD WITHOUT CONTRAST
CT CERVICAL SPINE WITHOUT CONTRAST
TECHNIQUE: Multidetector CT imaging of the head and cervical spine was
performed following the standard protocol without intravenous
contrast. Multiplanar CT image reconstructions of the cervical spine
were also generated.

[Series 1: topogram 0.6 t20f · sagittal · 1.00mm/px · 1 of 1 slices shown]
[im 1/1]
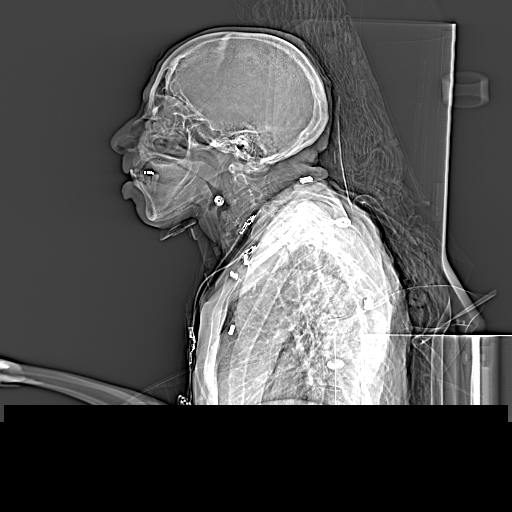

[1 of 1 positions shown; findings below may reference images not displayed]

FINDINGS: CT HEAD FINDINGS

Brain: No evidence of acute infarction, hemorrhage, hydrocephalus,
extra-axial collection or mass lesion/mass effect. Mild borderline
moderate patchy low-density in the cerebral white matter attributed
to chronic small vessel ischemia given vascular risk factors and
stability. Mild for age cerebral volume loss

Vascular: Atherosclerotic calcification

Skull: Negative

Sinuses/Orbits: Postoperative right ethmoid sinuses. Bilateral
cataract resection. No acute finding

CT CERVICAL SPINE FINDINGS

Alignment: Degenerative anterolisthesis at C4-5 and C7-T1 measuring
up to 4 mm.

Skull base and vertebrae: No evidence of acute fracture. Marked
motion degradation at the level of T2 and T3

Soft tissues and spinal canal: No gross canal hematoma or
prevertebral edema. Atherosclerotic calcification.

Disc levels: Generalized disc degeneration particularly severe at
C7-T1 and T1-2 where there is disc collapse and sclerosis. Diffuse
facet arthropathy most advanced at C7-T1 where there is joint
distortion and anterolisthesis. Ankylosis has occurred at C6-7.

Upper chest: Emphysema with motion.
IMPRESSION: No evidence of acute intracranial or cervical spine injury.

## 2020-02-04 IMAGING — DX PORTABLE CHEST - 1 VIEW
1 series · 1 of 1 positions shown · non-contrast
Comparison: 12/24/2018, 12/11/2018

CLINICAL DATA: 77-year-old female with a history of current
admission.

EXAM:
PORTABLE CHEST 1 VIEW

[chest ap]
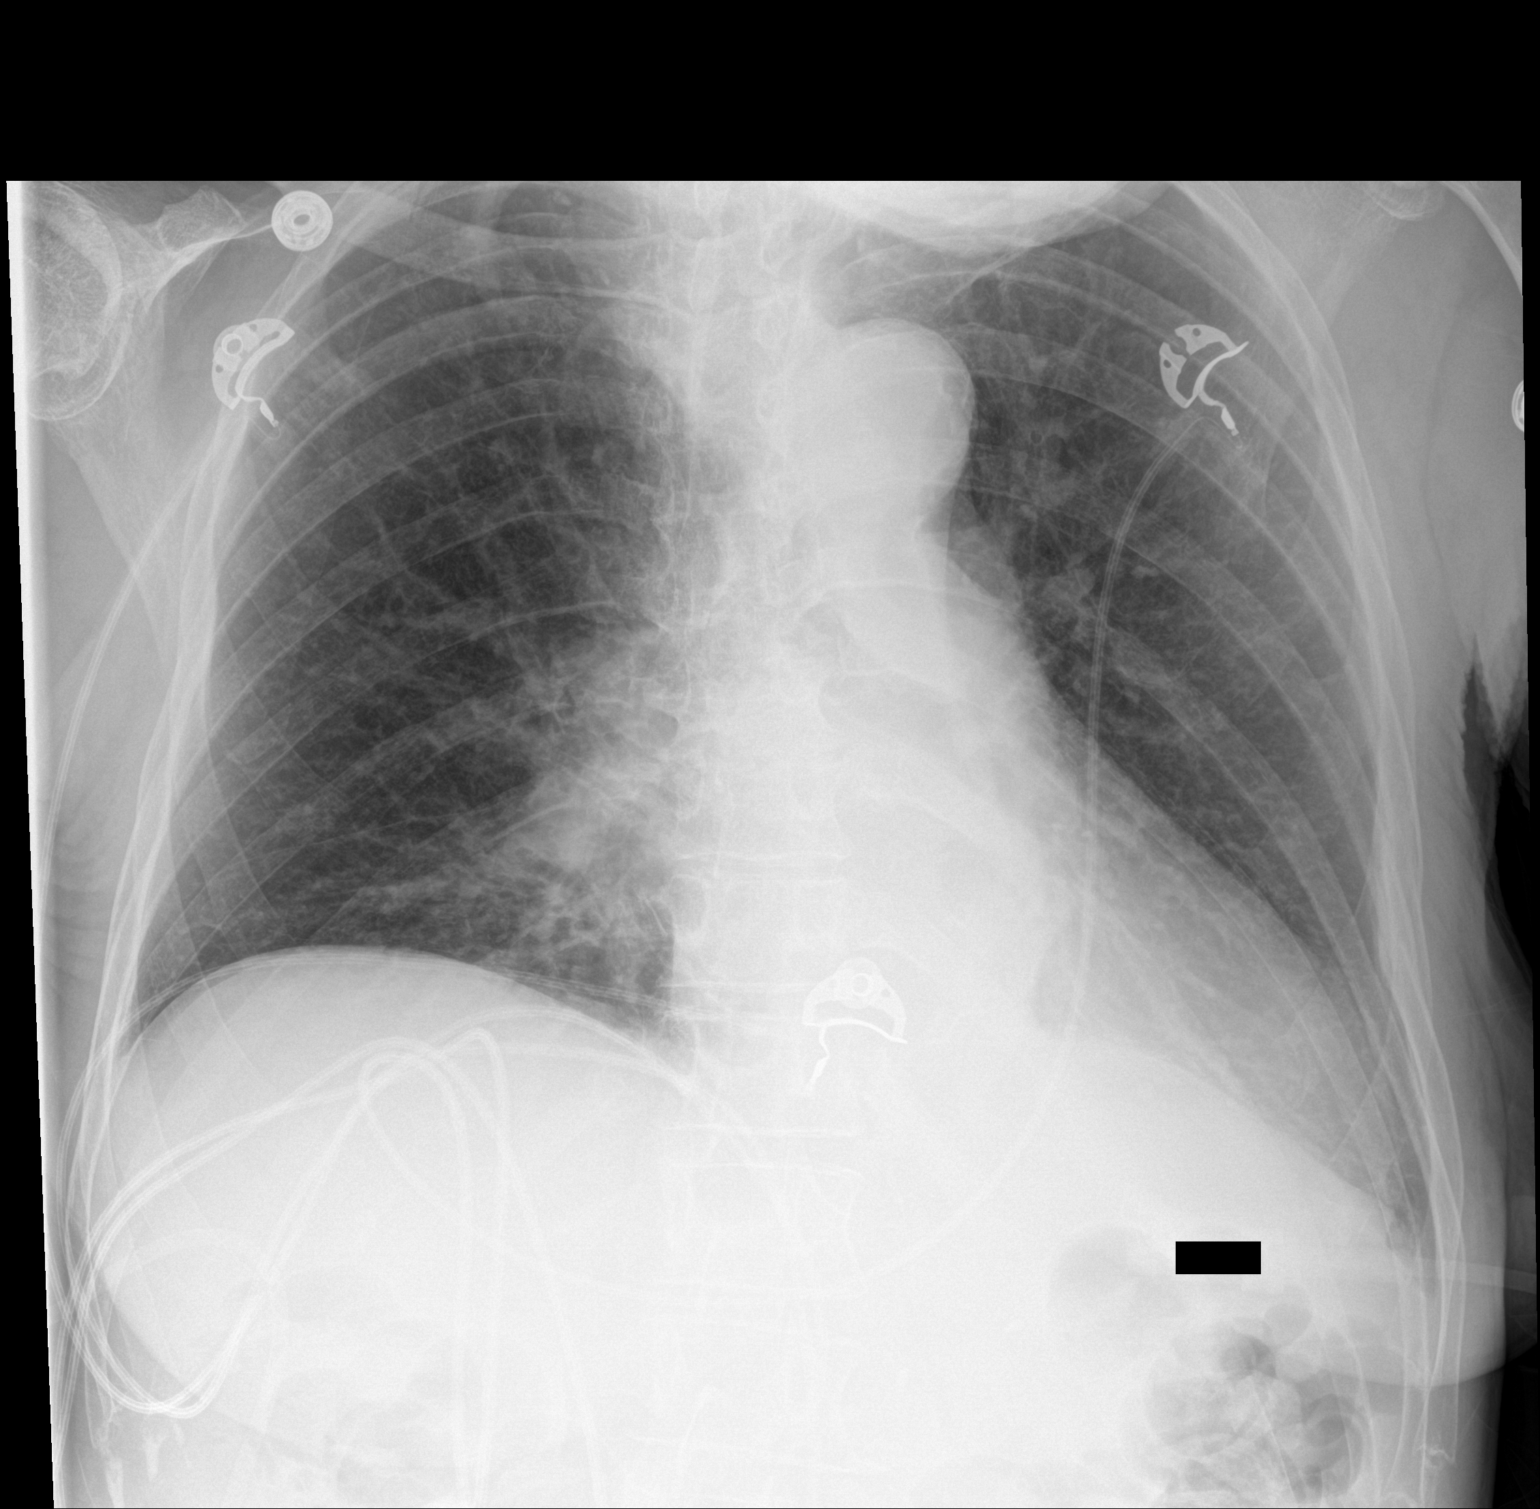

[1 of 1 positions shown; findings below may reference images not displayed]

FINDINGS: Cardiomediastinal silhouette unchanged.  Left rotation.

No pneumothorax, new confluent airspace disease, or large pleural
effusion. Coarsened interstitial markings bilateral lungs, similar
to the prior.

No displaced fracture. Similar appearance of chronic irregularity at
the lateral left lower chest.
IMPRESSION: Low lung volumes likely with atelectasis/scarring and no evidence
lobar pneumonia or significant edema on the current plain film.

## 2020-02-06 ENCOUNTER — Inpatient Hospital Stay (HOSPITAL_COMMUNITY)
Admission: EM | Admit: 2020-02-06 | Discharge: 2020-02-10 | DRG: 684 | Disposition: A | Discharge status: Home / Self Care | Payer: Medicare HMO | Attending: Internal Medicine | Admitting: Internal Medicine

## 2020-02-06 ENCOUNTER — Encounter (HOSPITAL_COMMUNITY): Payer: Self-pay | Admitting: Emergency Medicine

## 2020-02-06 ENCOUNTER — Emergency Department (HOSPITAL_COMMUNITY): Payer: Medicare HMO

## 2020-02-06 ENCOUNTER — Other Ambulatory Visit: Payer: Self-pay

## 2020-02-06 DIAGNOSIS — I129 Hypertensive chronic kidney disease with stage 1 through stage 4 chronic kidney disease, or unspecified chronic kidney disease: Secondary | ICD-10-CM | POA: Diagnosis present

## 2020-02-06 DIAGNOSIS — D638 Anemia in other chronic diseases classified elsewhere: Secondary | ICD-10-CM | POA: Diagnosis present

## 2020-02-06 DIAGNOSIS — E860 Dehydration: Secondary | ICD-10-CM | POA: Diagnosis present

## 2020-02-06 DIAGNOSIS — D75839 Thrombocytosis, unspecified: Secondary | ICD-10-CM | POA: Diagnosis present

## 2020-02-06 DIAGNOSIS — N179 Acute kidney failure, unspecified: Principal | ICD-10-CM | POA: Diagnosis present

## 2020-02-06 DIAGNOSIS — R55 Syncope and collapse: Secondary | ICD-10-CM | POA: Diagnosis present

## 2020-02-06 DIAGNOSIS — I739 Peripheral vascular disease, unspecified: Secondary | ICD-10-CM | POA: Diagnosis present

## 2020-02-06 DIAGNOSIS — Z8249 Family history of ischemic heart disease and other diseases of the circulatory system: Secondary | ICD-10-CM

## 2020-02-06 DIAGNOSIS — Z83438 Family history of other disorder of lipoprotein metabolism and other lipidemia: Secondary | ICD-10-CM | POA: Diagnosis not present

## 2020-02-06 DIAGNOSIS — R627 Adult failure to thrive: Secondary | ICD-10-CM | POA: Diagnosis present

## 2020-02-06 DIAGNOSIS — Z20822 Contact with and (suspected) exposure to covid-19: Secondary | ICD-10-CM | POA: Diagnosis present

## 2020-02-06 DIAGNOSIS — E785 Hyperlipidemia, unspecified: Secondary | ICD-10-CM | POA: Diagnosis present

## 2020-02-06 DIAGNOSIS — Z79899 Other long term (current) drug therapy: Secondary | ICD-10-CM | POA: Diagnosis not present

## 2020-02-06 DIAGNOSIS — D473 Essential (hemorrhagic) thrombocythemia: Secondary | ICD-10-CM | POA: Diagnosis not present

## 2020-02-06 DIAGNOSIS — Z66 Do not resuscitate: Secondary | ICD-10-CM | POA: Diagnosis present

## 2020-02-06 DIAGNOSIS — N1832 Chronic kidney disease, stage 3b: Secondary | ICD-10-CM | POA: Diagnosis not present

## 2020-02-06 DIAGNOSIS — D72829 Elevated white blood cell count, unspecified: Secondary | ICD-10-CM | POA: Diagnosis not present

## 2020-02-06 DIAGNOSIS — Z7982 Long term (current) use of aspirin: Secondary | ICD-10-CM

## 2020-02-06 DIAGNOSIS — F0390 Unspecified dementia without behavioral disturbance: Secondary | ICD-10-CM | POA: Diagnosis present

## 2020-02-06 DIAGNOSIS — D72823 Leukemoid reaction: Secondary | ICD-10-CM | POA: Diagnosis present

## 2020-02-06 DIAGNOSIS — N189 Chronic kidney disease, unspecified: Secondary | ICD-10-CM | POA: Diagnosis present

## 2020-02-06 DIAGNOSIS — I951 Orthostatic hypotension: Secondary | ICD-10-CM | POA: Diagnosis present

## 2020-02-06 DIAGNOSIS — Z7902 Long term (current) use of antithrombotics/antiplatelets: Secondary | ICD-10-CM

## 2020-02-06 DIAGNOSIS — R7989 Other specified abnormal findings of blood chemistry: Secondary | ICD-10-CM | POA: Diagnosis present

## 2020-02-06 DIAGNOSIS — I959 Hypotension, unspecified: Secondary | ICD-10-CM | POA: Diagnosis present

## 2020-02-06 LAB — COMPREHENSIVE METABOLIC PANEL
ALT: 31 U/L (ref 0–44)
AST: 35 U/L (ref 15–41)
Albumin: 3.8 g/dL (ref 3.5–5.0)
Alkaline Phosphatase: 86 U/L (ref 38–126)
Anion gap: 17 — ABNORMAL HIGH (ref 5–15)
BUN: 57 mg/dL — ABNORMAL HIGH (ref 8–23)
CO2: 21 mmol/L — ABNORMAL LOW (ref 22–32)
Calcium: 9.4 mg/dL (ref 8.9–10.3)
Chloride: 100 mmol/L (ref 98–111)
Creatinine, Ser: 3.76 mg/dL — ABNORMAL HIGH (ref 0.44–1.00)
GFR calc Af Amer: 13 mL/min — ABNORMAL LOW (ref >60)
GFR calc non Af Amer: 11 mL/min — ABNORMAL LOW (ref >60)
Glucose, Bld: 127 mg/dL — ABNORMAL HIGH (ref 70–99)
Potassium: 4 mmol/L (ref 3.5–5.1)
Sodium: 138 mmol/L (ref 135–145)
Total Bilirubin: 0.5 mg/dL (ref 0.3–1.2)
Total Protein: 7.4 g/dL (ref 6.5–8.1)

## 2020-02-06 LAB — CBC WITH DIFFERENTIAL/PLATELET
Abs Immature Granulocytes: 0.07 10*3/uL (ref 0.00–0.07)
Basophils Absolute: 0.1 10*3/uL (ref 0.0–0.1)
Basophils Relative: 1 %
Eosinophils Absolute: 0.4 10*3/uL (ref 0.0–0.5)
Eosinophils Relative: 3 %
HCT: 34.1 % — ABNORMAL LOW (ref 36.0–46.0)
Hemoglobin: 10.9 g/dL — ABNORMAL LOW (ref 12.0–15.0)
Immature Granulocytes: 1 %
Lymphocytes Relative: 10 %
Lymphs Abs: 1.3 10*3/uL (ref 0.7–4.0)
MCH: 29.1 pg (ref 26.0–34.0)
MCHC: 32 g/dL (ref 30.0–36.0)
MCV: 91.2 fL (ref 80.0–100.0)
Monocytes Absolute: 0.8 10*3/uL (ref 0.1–1.0)
Monocytes Relative: 6 %
Neutro Abs: 10.2 10*3/uL — ABNORMAL HIGH (ref 1.7–7.7)
Neutrophils Relative %: 79 %
Platelets: 415 10*3/uL — ABNORMAL HIGH (ref 150–400)
RBC: 3.74 MIL/uL — ABNORMAL LOW (ref 3.87–5.11)
RDW: 13.8 % (ref 11.5–15.5)
WBC: 12.9 10*3/uL — ABNORMAL HIGH (ref 4.0–10.5)
nRBC: 0 % (ref 0.0–0.2)

## 2020-02-06 LAB — URINALYSIS, ROUTINE W REFLEX MICROSCOPIC
Bilirubin Urine: NEGATIVE
Glucose, UA: NEGATIVE mg/dL
Hgb urine dipstick: NEGATIVE
Ketones, ur: NEGATIVE mg/dL
Leukocytes,Ua: NEGATIVE
Nitrite: NEGATIVE
Protein, ur: 100 mg/dL — AB
Specific Gravity, Urine: 1.008 (ref 1.005–1.030)
pH: 7 (ref 5.0–8.0)

## 2020-02-06 LAB — LACTIC ACID, PLASMA: Lactic Acid, Venous: 1 mmol/L (ref 0.5–1.9)

## 2020-02-06 LAB — TROPONIN I (HIGH SENSITIVITY)
Troponin I (High Sensitivity): 17 ng/L (ref <18)
Troponin I (High Sensitivity): 14 ng/L (ref <18)

## 2020-02-06 LAB — LIPASE, BLOOD: Lipase: 23 U/L (ref 11–51)

## 2020-02-06 LAB — CK: Total CK: 191 U/L (ref 38–234)

## 2020-02-06 MED ORDER — BUSPIRONE HCL 5 MG PO TABS: 15.0000 mg | ORAL_TABLET | Freq: Three times a day (TID) | ORAL | Status: DC | PRN

## 2020-02-06 MED ORDER — ENSURE ENLIVE PO LIQD
237.0000 mL | Freq: Two times a day (BID) | ORAL | Status: DC
  Administered 2020-02-07: 237 mL via ORAL
  Filled 2020-02-06: qty 237

## 2020-02-06 MED ORDER — ALBUTEROL SULFATE (2.5 MG/3ML) 0.083% IN NEBU: 2.5000 mg | INHALATION_SOLUTION | Freq: Four times a day (QID) | RESPIRATORY_TRACT | Status: DC | PRN

## 2020-02-06 MED ORDER — DONEPEZIL HCL 5 MG PO TABS
5.0000 mg | ORAL_TABLET | Freq: Every day | ORAL | Status: DC
  Administered 2020-02-06 – 2020-02-10 (×5): 5 mg via ORAL
  Filled 2020-02-06 (×5): qty 1

## 2020-02-06 MED ORDER — ONDANSETRON HCL 4 MG PO TABS: 4.0000 mg | ORAL_TABLET | Freq: Four times a day (QID) | ORAL | Status: DC | PRN

## 2020-02-06 MED ORDER — DULOXETINE HCL 30 MG PO CPEP
30.0000 mg | ORAL_CAPSULE | Freq: Every day | ORAL | Status: DC
  Administered 2020-02-06 – 2020-02-10 (×5): 30 mg via ORAL
  Filled 2020-02-06 (×5): qty 1

## 2020-02-06 MED ORDER — CITALOPRAM HYDROBROMIDE 20 MG PO TABS
20.0000 mg | ORAL_TABLET | Freq: Every day | ORAL | Status: DC
  Administered 2020-02-06 – 2020-02-10 (×5): 20 mg via ORAL
  Filled 2020-02-06 (×3): qty 1
  Filled 2020-02-06: qty 2
  Filled 2020-02-06: qty 1

## 2020-02-06 MED ORDER — SODIUM CHLORIDE 0.9 % IV SOLN
INTRAVENOUS | Status: DC
  Administered 2020-02-06: 1000 mL via INTRAVENOUS

## 2020-02-06 MED ORDER — ONDANSETRON HCL 4 MG/2ML IJ SOLN: 4.0000 mg | Freq: Four times a day (QID) | INTRAMUSCULAR | Status: DC | PRN

## 2020-02-06 MED ORDER — ISOSORB DINITRATE-HYDRALAZINE 20-37.5 MG PO TABS
2.0000 | ORAL_TABLET | Freq: Three times a day (TID) | ORAL | Status: DC
  Administered 2020-02-06 – 2020-02-10 (×12): 2 via ORAL
  Filled 2020-02-06 (×12): qty 2

## 2020-02-06 MED ORDER — SODIUM BICARBONATE 650 MG PO TABS
1300.0000 mg | ORAL_TABLET | Freq: Two times a day (BID) | ORAL | Status: DC
  Administered 2020-02-06 – 2020-02-10 (×8): 1300 mg via ORAL
  Filled 2020-02-06 (×8): qty 2

## 2020-02-06 MED ORDER — ACETAMINOPHEN 325 MG PO TABS
650.0000 mg | ORAL_TABLET | Freq: Four times a day (QID) | ORAL | Status: DC | PRN
  Administered 2020-02-07 – 2020-02-10 (×4): 650 mg via ORAL
  Filled 2020-02-06 (×4): qty 2

## 2020-02-06 MED ORDER — MEGESTROL ACETATE 40 MG PO TABS
40.0000 mg | ORAL_TABLET | Freq: Two times a day (BID) | ORAL | Status: DC
  Administered 2020-02-06 – 2020-02-10 (×8): 40 mg via ORAL
  Filled 2020-02-06 (×9): qty 1

## 2020-02-06 MED ORDER — ASPIRIN EC 81 MG PO TBEC
81.0000 mg | DELAYED_RELEASE_TABLET | Freq: Every day | ORAL | Status: DC
  Administered 2020-02-06 – 2020-02-10 (×5): 81 mg via ORAL
  Filled 2020-02-06 (×5): qty 1

## 2020-02-06 MED ORDER — CLOPIDOGREL BISULFATE 75 MG PO TABS
75.0000 mg | ORAL_TABLET | Freq: Every day | ORAL | Status: DC
  Administered 2020-02-07 – 2020-02-10 (×4): 75 mg via ORAL
  Filled 2020-02-06 (×4): qty 1

## 2020-02-06 MED ORDER — DICLOFENAC SODIUM 1 % EX GEL
2.0000 g | Freq: Four times a day (QID) | CUTANEOUS | Status: DC
  Administered 2020-02-08: 2 g via TOPICAL
  Filled 2020-02-06 (×2): qty 100

## 2020-02-06 MED ORDER — HEPARIN SODIUM (PORCINE) 5000 UNIT/ML IJ SOLN
5000.0000 [IU] | Freq: Three times a day (TID) | INTRAMUSCULAR | Status: DC
  Administered 2020-02-06 – 2020-02-10 (×12): 5000 [IU] via SUBCUTANEOUS
  Filled 2020-02-06 (×12): qty 1

## 2020-02-06 MED ORDER — SODIUM CHLORIDE 0.9 % IV BOLUS
500.0000 mL | Freq: Once | INTRAVENOUS | Status: AC
  Administered 2020-02-06: 500 mL via INTRAVENOUS

## 2020-02-06 MED ORDER — ACETAMINOPHEN 650 MG RE SUPP: 650.0000 mg | Freq: Four times a day (QID) | RECTAL | Status: DC | PRN

## 2020-02-06 MED ORDER — GABAPENTIN 100 MG PO CAPS
100.0000 mg | ORAL_CAPSULE | Freq: Every day | ORAL | Status: DC
  Administered 2020-02-06 – 2020-02-10 (×5): 100 mg via ORAL
  Filled 2020-02-06 (×5): qty 1

## 2020-02-06 MED ORDER — ROSUVASTATIN CALCIUM 20 MG PO TABS
40.0000 mg | ORAL_TABLET | Freq: Every day | ORAL | Status: DC
  Administered 2020-02-07 – 2020-02-10 (×3): 40 mg via ORAL
  Filled 2020-02-06 (×3): qty 2

## 2020-02-06 NOTE — ED Notes (Signed)
Got patient undress on the monitor did ekg shown to er provider patient is resting with call bell in reach

## 2020-02-06 NOTE — ED Provider Notes (Signed)
Emergency Department Provider Note   I have reviewed the triage vital signs and the nursing notes.   HISTORY  Chief Complaint Loss of Consciousness   HPI Alyssa [name] is a 79 y.o. female with PMH of CKD, HTN, HLD, PVD, and syncope in the past presents to the emergency department by EMS from home with near syncope event and shortness of breath.  Patient is currently living with a friend.  She felt like she was going to pass out and EMS was called.  They arrived to find her with room air oxygen saturation 98% and placed her on 2 L nasal cannula.  Her initial blood pressure with 78/52 which improved after 300 mL bolus.  Patient denies any chest pain with her shortness of breath.  She is not experiencing vomiting, diarrhea, dysuria, hesitancy, urgency.  No new medications.  She notes some intermittent central abdominal pain without radiation over the past 3 weeks which she is currently experiencing today but not worse than normal. No new medications.   Patient was admitted with a similar presentation in March 2021 and syncope at that time thought to be multifactorial.    Past Medical History:  Diagnosis Date  . Acute respiratory failure with hypoxia ([place]) 11/01/2019  . Anxiety   . Arthritis   . Chest pain   . Chronic kidney disease   . Dyspnea   . Elevated troponin 12/13/2018  . Headache(784.0)   . Hyperlipidemia   . Hypertension   . Joint pain   . Leg pain   . Peripheral neuropathy   . Peripheral vascular disease [facility])     Patient Active Problem List   Diagnosis Date Noted  . Acute renal failure superimposed on chronic kidney disease ([place]) 02/06/2020  . Leukocytosis 02/06/2020  . Thrombocytosis ([place]) 02/06/2020  . Palliative care encounter   . Constipation 01/05/2020  . Severe protein-calorie malnutrition ([place]) 01/04/2020  . Acute on chronic renal failure ([place]) 01/04/2020  . Hyperkalemia 01/03/2020  . AKI (acute kidney injury) ([place]) 01/03/2020  . Acute  respiratory failure with hypoxia ([place]) 11/01/2019  . Acute lower UTI 11/01/2019  . CAP (community acquired pneumonia) 11/01/2019  . Acute cystitis without hematuria   . Encephalopathy 09/18/2019  . SAH (subarachnoid hemorrhage) ([place]) 09/18/2019  . Dementia ([place]) 09/18/2019  . Metabolic acidosis 23/55/7322  . ARF (acute renal failure) ([place]) 09/17/2019  . Acute encephalopathy 09/17/2019  . Abnormal CT scan, colon   . Diarrhea   . Diverticulitis 06/07/2019  . Depression with anxiety 06/07/2019  . DNR (do not resuscitate) 03/23/2019  . Volume overload 03/23/2019  . Edema 03/23/2019  . Acute renal failure (ARF) ([place]) 03/08/2019  . Hypokalemia 03/08/2019  . Palliative care by specialist   . Goals of care, counseling/discussion   . Altered mental status   . Encephalopathy, toxic 12/24/2018  . Aspiration pneumonia of both lower lobes due to gastric secretions ([place]) 12/24/2018  . Non-ST elevation (NSTEMI) myocardial infarction ([place])   . Chest pain   . Dyspnea on exertion   . Elevated troponin level 12/09/2018  . Chronic migraine 03/27/2018  . Lumbar radiculopathy 03/27/2018  . Gait abnormality 03/27/2018  . CKD (chronic kidney disease) stage 4, GFR 15-29 ml/min ([facility]) 12/28/2016  . Symptomatic anemia 12/28/2016  . Chronic pain syndrome 12/28/2016  . Benzodiazepine withdrawal without complication ([place]) 02/54/2706  . Chronic right shoulder pain 12/06/2016  . Fall 09/17/2016  . Fracture of humeral head, closed, right, initial encounter 09/17/2016  . Rib fractures 09/17/2016  .  Multiple falls 09/17/2016  . Severe muscle deconditioning 09/17/2016  . Failure to thrive in adult 09/17/2016  . Melena 04/25/2016  . Transient hypotension 04/25/2016  . Anxiety state 04/25/2016  . Tobacco abuse 04/25/2016  . Hyperlipidemia 04/25/2016  . Syncope 04/24/2016  . Near syncope 04/24/2016  . Gait difficulty 01/12/2016  . Foot drop, left 01/12/2016  . Severe recurrent major depression without  psychotic features ([place]) 08/28/2015  . Spondylolisthesis at L4-L5 level 07/30/2015  . PVD (peripheral vascular disease) ([place]) 07/29/2014  . Weakness-Bilateral arm/leg 07/29/2014  . Numbness-left leg 07/29/2014  . Swelling of limb-Legs 07/29/2014  . Atherosclerosis of native arteries of the extremities with intermittent claudication 09/30/2011    Past Surgical History:  Procedure Laterality Date  . ABDOMINAL ANGIOGRAM N/A 10/29/2011   Procedure: ABDOMINAL ANGIOGRAM;  Surgeon: Elam Dutch, MD;  Location: [facility] CATH LAB;  Service: Cardiovascular;  Laterality: N/A;  . BACK SURGERY    . FEMORAL-FEMORAL BYPASS GRAFT  08/31/2010  . FLEXIBLE SIGMOIDOSCOPY N/A 06/12/2019   Procedure: FLEXIBLE SIGMOIDOSCOPY;  Surgeon: Ladene Artist, MD;  [facility];  Service: Endoscopy;  Laterality: N/A;  Patient had little bit to eat this morning so this will be unsedated.  Marland Kitchen JOINT REPLACEMENT Right 2012   Hip  . LOWER EXTREMITY ANGIOGRAPHY  06/14/2017   Procedure: Lower Extremity Angiography;  Surgeon: Adrian Prows, MD;  Location: [place] CV LAB;  Service: Cardiovascular;;  Bilateral limited angio performed  . RENAL ANGIOGRAPHY N/A 06/14/2017   Procedure: Renal Angiography;  Surgeon: Adrian Prows, MD;  Location: [place] CV LAB;  Service: Cardiovascular;  Laterality: N/A;  . TOTAL HIP ARTHROPLASTY     right    Allergies Patient has no known allergies.  Family History  Problem Relation Age of Onset  . Heart attack Mother   . Heart disease Mother   . Hyperlipidemia Mother   . Hypertension Mother   . Heart attack Father   . Heart disease Father        Before age 36  . Hyperlipidemia Father   . Hypertension Father   . Cancer Sister        brain tumor  . Aneurysm Brother        brain  . Colon cancer Neg Hx   . Liver cancer Neg Hx     Social History Social History   Tobacco Use  . Smoking status: Current Every Day Smoker    Packs/day: 1.00    Years: 27.00    Pack years: 27.00     Types: Cigarettes    Last attempt to quit: 07/23/2019    Years since quitting: 0.5  . Smokeless tobacco: Never Used  Substance Use Topics  . Alcohol use: No  . Drug use: No    Review of Systems  Constitutional: No fever/chills. Positive generalized weakness.  Eyes: No visual changes. ENT: No sore throat. Cardiovascular: Denies chest pain. Positive near syncope.  Respiratory: Positive shortness of breath and cough.  Gastrointestinal: Positive central abdominal pain. No nausea, no vomiting. No diarrhea. No constipation. Genitourinary: Negative for dysuria. Musculoskeletal: Negative for back pain. Skin: Negative for rash. Neurological: Negative for headaches, focal weakness or numbness.  10-point ROS otherwise negative.  ____________________________________________   PHYSICAL EXAM:  VITAL SIGNS: ED Triage Vitals  Enc Vitals Group     BP 02/06/20 1051 114/71     Pulse Rate 02/06/20 1051 73     Resp 02/06/20 1051 17     Temp 02/06/20 1051 (!) 97.3  F (36.3 C)     Temp Source 02/06/20 1051 Tympanic     SpO2 02/06/20 1049 (S) 90 %     Weight 02/06/20 1059 98 lb 12.3 oz (44.8 kg)     Height 02/06/20 1059 5\' 3"  (1.6 m)   Constitutional: Alert. Appears generally weak and fatigued. No acute distress and able to provide a history.  Eyes: Conjunctivae are normal. PERRL.  Head: Atraumatic. Nose: No congestion/rhinnorhea. Mouth/Throat: Mucous membranes are dry.  Neck: No stridor.   Cardiovascular: Normal rate, regular rhythm. Good peripheral circulation. Grossly normal heart sounds.   Respiratory: Normal respiratory effort.  No retractions. Lungs CTAB. Gastrointestinal: Soft with mild diffuse tenderness. No peritoneal findings. No distention.  Musculoskeletal: No lower extremity tenderness nor edema. No gross deformities of extremities. Neurologic:  Normal speech and language. No gross focal neurologic deficits are appreciated. Globally decreased strength but no unilateral  weakness/numbness. Normal CN exam 2-12.  Skin:  Skin is warm, dry and intact. No rash noted.   ____________________________________________   LABS (all labs ordered are listed, but only abnormal results are displayed)  Labs Reviewed  COMPREHENSIVE METABOLIC PANEL - Abnormal; Notable for the following components:      Result Value   CO2 21 (*)    Glucose, Bld 127 (*)    BUN 57 (*)    Creatinine, Ser 3.76 (*)    GFR calc non Af Amer 11 (*)    GFR calc Af Amer 13 (*)    Anion gap 17 (*)    All other components within normal limits  CBC WITH DIFFERENTIAL/PLATELET - Abnormal; Notable for the following components:   WBC 12.9 (*)    RBC 3.74 (*)    Hemoglobin 10.9 (*)    HCT 34.1 (*)    Platelets 415 (*)    Neutro Abs 10.2 (*)    All other components within normal limits  URINALYSIS, ROUTINE W REFLEX MICROSCOPIC - Abnormal; Notable for the following components:   Color, Urine STRAW (*)    Protein, ur 100 (*)    Bacteria, UA RARE (*)    All other components within normal limits  URINE CULTURE  CULTURE, BLOOD (ROUTINE X 2)  CULTURE, BLOOD (ROUTINE X 2)  LIPASE, BLOOD  LACTIC ACID, PLASMA  CK  BASIC METABOLIC PANEL  CBC  TROPONIN I (HIGH SENSITIVITY)  TROPONIN I (HIGH SENSITIVITY)   ____________________________________________  EKG   EKG Interpretation  Date/Time:  Wednesday February 06 2020 10:50:18 EDT Ventricular Rate:  73 PR Interval:    QRS Duration: 86 QT Interval:  442 QTC Calculation: 488 R Axis:   71 Text Interpretation: Sinus rhythm LVH with secondary repolarization abnormality Borderline prolonged QT interval No STEMI Confirmed by Nanda Quinton [phone]) on 02/06/2020 11:02:15 AM       ____________________________________________  RADIOLOGY  DG Chest Portable 1 View  Result Date: 02/06/2020 CLINICAL DATA:  Shortness of breath. EXAM: PORTABLE CHEST 1 VIEW COMPARISON:  January 05, 2020. FINDINGS: The heart size and mediastinal contours are within normal  limits. No pneumothorax or pleural effusion is noted. Right lung is clear. Minimal left basilar subsegmental atelectasis is noted. The visualized skeletal structures are unremarkable. IMPRESSION: Minimal left basilar subsegmental atelectasis. Electronically Signed   By: Marijo Conception M.D.   On: 02/06/2020 11:35    ____________________________________________   PROCEDURES  Procedure(s) performed:   Procedures  CRITICAL CARE Performed by: Margette Fast Total critical care time: 35 minutes Critical care time was exclusive of separately billable  procedures and treating other patients. Critical care was necessary to treat or prevent imminent or life-threatening deterioration. Critical care was time spent personally by me on the following activities: development of treatment plan with patient and/or surrogate as well as nursing, discussions with consultants, evaluation of patient's response to treatment, examination of patient, obtaining history from patient or surrogate, ordering and performing treatments and interventions, ordering and review of laboratory studies, ordering and review of radiographic studies, pulse oximetry and re-evaluation of patient's condition.  Nanda Quinton, MD Emergency Medicine  ____________________________________________   INITIAL IMPRESSION / ASSESSMENT AND PLAN / ED COURSE  Pertinent labs & imaging results that were available during my care of the patient were reviewed by me and considered in my medical decision making (see chart for details).   Patient presents to the emergency department for evaluation of near syncope with abdominal discomfort and subjective shortness of breath with cough.  Patient is up-to-date on Covid vaccine.  Abdomen is mildly tender with no peritoneal findings.  No focal neuro deficits on my exam.  Reviewed the discharge summary from her most recent admission.  Plan for screening labs, chest x-ray, reevaluate.  EKG interpreted as above  with no acute findings.  Plan for gentle IV fluid bolus with hypotension in the field.   Patient with AKI on labs with Creatinine of 3.76 and elevated BUN. Suspect pre-renal cause clinically. No infection source. In and out cath with ~ 300 mL of urine. No foley placed. Continue IVF and plan for admit.   Discussed patient's case with [facility] to request admission. Patient and family (if present) updated with plan. Care transferred to [facility] service.  I reviewed all nursing notes, vitals, pertinent old records, EKGs, labs, imaging (as available).  ____________________________________________  FINAL CLINICAL IMPRESSION(S) / ED DIAGNOSES  Final diagnoses:  AKI (acute kidney injury) ([place])  Near syncope     MEDICATIONS GIVEN DURING THIS VISIT:  Medications  heparin injection 5,000 Units (5,000 Units Subcutaneous Given 02/06/20 1532)  0.9 %  sodium chloride infusion ( Intravenous Rate/Dose Verify 02/06/20 1610)  acetaminophen (TYLENOL) tablet 650 mg (has no administration in time range)    Or  acetaminophen (TYLENOL) suppository 650 mg (has no administration in time range)  ondansetron (ZOFRAN) tablet 4 mg (has no administration in time range)    Or  ondansetron (ZOFRAN) injection 4 mg (has no administration in time range)  albuterol (PROVENTIL) (2.5 MG/3ML) 0.083% nebulizer solution 2.5 mg (has no administration in time range)  aspirin EC tablet 81 mg (has no administration in time range)  megestrol (MEGACE) tablet 40 mg (has no administration in time range)  rosuvastatin (CRESTOR) tablet 40 mg (has no administration in time range)  donepezil (ARICEPT) tablet 5 mg (has no administration in time range)  DULoxetine (CYMBALTA) DR capsule 30 mg (has no administration in time range)  citalopram (CELEXA) tablet 20 mg (has no administration in time range)  busPIRone (BUSPAR) tablet 15 mg (has no administration in time range)  sodium bicarbonate tablet 1,300 mg (has no administration in time range)    isosorbide-hydrALAZINE (BIDIL) 20-37.5 MG per tablet 2 tablet (has no administration in time range)  clopidogrel (PLAVIX) tablet 75 mg (has no administration in time range)  gabapentin (NEURONTIN) capsule 100 mg (has no administration in time range)  feeding supplement (ENSURE ENLIVE) (ENSURE ENLIVE) liquid 237 mL (has no administration in time range)  diclofenac Sodium (VOLTAREN) 1 % topical gel 2 g (has no administration in time range)  sodium chloride 0.9 %  bolus 500 mL (0 mLs Intravenous Stopped 02/06/20 1542)    Note:  This document was prepared using Dragon voice recognition software and may include unintentional dictation errors.  Nanda Quinton, MD, [facility] Emergency Medicine    Crockett Rallo, [facility], MD 02/06/20 1949

## 2020-02-06 NOTE — ED Notes (Signed)
[name] (son) notified of mother's condition --[phone]

## 2020-02-06 NOTE — ED Notes (Signed)
Tele   [phone] pts significant other, would like an update

## 2020-02-06 NOTE — H&P (Addendum)
History and Physical    [name]:[id number] DOB: 06-23-1941 DOA: 02/06/2020  Referring MD/NP/PA: Nanda Quinton, MD PCP: Loretha Brasil, FNP  Patient coming from: Home via EMS  Chief Complaint: Almost passed out  I have personally briefly reviewed patient's old medical records in [place]   HPI: Alyssa [name] is a 79 y.o. female with medical history significant of HTN, HLD, PVD, CKD, dementia, and syncope presents with complaints of almost passing out at home.  At the time she reports feeling hot and flushed as though she may pass out, but never lost consciousness.  She is currently living with her friend Mr. [name] -.  She had recently been hospitalized from 3/17-3/23 with syncope thought secondary to dehydration and anorexia with acute on chronic kidney disease.  She was discharged to a rehab facility prior to moving in with her friend.  At baseline patient ambulates with the use of a walker.  Since moving in with her friend she reports that she has good and bad days.  On bad days she is unable to get up or move around.  States that her appetite and intake of food and liquids has been adequate.  Other associated symptoms include generalized malaise, intermittent shortness of breath that has more so chronic, urinary frequency, and intermittent diarrhea.  Denies having any falls, dysuria, nausea, vomiting, abdominal pain, chest pain, fever, or cough.  En route with EMS patient was noted to have low blood pressures of 78/52.  Patient was given 300 mL of fluid with improvement of blood pressures.    ED Course: Upon admission to the emergency department patient was noted to be afebrile with O2 saturations above 90% on room air with improvement to 96% on 2 L nasal cannula oxygen.  Labs significant for WBC 12.9, 10.9, platelets 415, BUN 57, creatinine 3.76, troponin 17, lactic acid 1, and anion gap 17.  Blood cultures were obtained.  This x-ray showed minimal left  basilar segmental atelectasis.  Patient was given 500 mL bolus and TRH called to admit.   Review of Systems  Constitutional: Positive for malaise/fatigue. Negative for fever.  HENT: Negative for congestion and nosebleeds.   Eyes: Negative for photophobia and pain.  Respiratory: Positive for shortness of breath. Negative for cough.   Cardiovascular: Negative for chest pain and claudication.  Gastrointestinal: Positive for diarrhea. Negative for abdominal pain, blood in stool, nausea and vomiting.  Genitourinary: Negative for dysuria and hematuria.  Musculoskeletal: Negative for falls.  Skin: Negative for itching.  Neurological: Positive for dizziness and weakness. Negative for focal weakness and loss of consciousness.  Psychiatric/Behavioral: Negative for substance abuse.    Past Medical History:  Diagnosis Date  . Acute respiratory failure with hypoxia ([place]) 11/01/2019  . Anxiety   . Arthritis   . Chest pain   . Chronic kidney disease   . Dyspnea   . Elevated troponin 12/13/2018  . Headache(784.0)   . Hyperlipidemia   . Hypertension   . Joint pain   . Leg pain   . Peripheral neuropathy   . Peripheral vascular disease [facility])     Past Surgical History:  Procedure Laterality Date  . ABDOMINAL ANGIOGRAM N/A 10/29/2011   Procedure: ABDOMINAL ANGIOGRAM;  Surgeon: Elam Dutch, MD;  Location: [facility] CATH LAB;  Service: Cardiovascular;  Laterality: N/A;  . BACK SURGERY    . FEMORAL-FEMORAL BYPASS GRAFT  08/31/2010  . FLEXIBLE SIGMOIDOSCOPY N/A 06/12/2019   Procedure: FLEXIBLE SIGMOIDOSCOPY;  Surgeon: Fuller Plan,  Pricilla Riffle, MD;  Location: [facility] ENDOSCOPY;  Service: Endoscopy;  Laterality: N/A;  Patient had little bit to eat this morning so this will be unsedated.  Marland Kitchen JOINT REPLACEMENT Right 2012   Hip  . LOWER EXTREMITY ANGIOGRAPHY  06/14/2017   Procedure: Lower Extremity Angiography;  Surgeon: Adrian Prows, MD;  Location: [place] CV LAB;  Service: Cardiovascular;;  Bilateral limited angio  performed  . RENAL ANGIOGRAPHY N/A 06/14/2017   Procedure: Renal Angiography;  Surgeon: Adrian Prows, MD;  Location: [place] CV LAB;  Service: Cardiovascular;  Laterality: N/A;  . TOTAL HIP ARTHROPLASTY     right     reports that she has been smoking cigarettes. She has a 27.00 pack-year smoking history. She has never used smokeless tobacco. She reports that she does not drink alcohol or use drugs.  No Known Allergies  Family History  Problem Relation Age of Onset  . Heart attack Mother   . Heart disease Mother   . Hyperlipidemia Mother   . Hypertension Mother   . Heart attack Father   . Heart disease Father        Before age 67  . Hyperlipidemia Father   . Hypertension Father   . Cancer Sister        brain tumor  . Aneurysm Brother        brain  . Colon cancer Neg Hx   . Liver cancer Neg Hx     Prior to Admission medications   Medication Sig Start Date End Date Taking? Authorizing Provider  acetaminophen (TYLENOL) 325 MG tablet Take 2 tablets (650 mg total) by mouth every 6 (six) hours as needed for mild pain, moderate pain or fever (or Fever >/= 101). 06/16/19   Hongalgi, Lenis Dickinson, MD  albuterol (PROAIR HFA) 108 (90 Base) MCG/ACT inhaler Inhale 2 puffs into the lungs every 6 (six) hours as needed for wheezing or shortness of breath.    [provider]  aspirin 81 MG EC tablet Take 1 tablet (81 mg total) by mouth daily. 01/09/19   Caren Griffins, MD  busPIRone (BUSPAR) 15 MG tablet Take 15 mg by mouth 3 (three) times daily as needed for anxiety. 02/28/19   [provider]  citalopram (CELEXA) 20 MG tablet Take 1 tablet (20 mg total) by mouth daily. 03/23/19 03/22/20  Shelly Coss, MD  cloNIDine (CATAPRES) 0.2 MG tablet Take 1 tablet (0.2 mg total) by mouth 2 (two) times daily. 01/08/20   Allie Bossier, MD  clopidogrel (PLAVIX) 75 MG tablet Take 1 tablet (75 mg total) by mouth daily. 01/09/19   Caren Griffins, MD  diclofenac Sodium (VOLTAREN) 1 % GEL Apply 2  g topically 4 (four) times daily. 01/15/20   [provider]  donepezil (ARICEPT) 5 MG tablet Take 5 mg by mouth daily.    [provider]  DULoxetine (CYMBALTA) 30 MG capsule Take 30 mg by mouth daily.    [provider]  feeding supplement, ENSURE ENLIVE, (ENSURE ENLIVE) LIQD Take 237 mLs by mouth 2 (two) times daily between meals. 06/16/19   Hongalgi, Lenis Dickinson, MD  Ferrous Sulfate (IRON HIGH-POTENCY) 325 MG TABS Take 325 mg by mouth daily with breakfast. 06/30/19   Hongalgi, Lenis Dickinson, MD  furosemide (LASIX) 20 MG tablet Take 2 tablets (40 mg total) by mouth daily as needed for fluid (Fluid overload.  Ensure patient maintains euvolemia.  Also I will for hyperkalemia). 01/08/20   Allie Bossier, MD  gabapentin (NEURONTIN)  100 MG capsule Take 100 mg by mouth 3 (three) times daily. 10/25/19   [provider]  isosorbide-hydrALAZINE (BIDIL) 20-37.5 MG tablet Take 2 tablets by mouth 3 (three) times daily.    [provider]  megestrol (MEGACE) 40 MG tablet Take 40 mg by mouth 2 (two) times daily.    [provider]  metoprolol succinate (TOPROL-XL) 100 MG 24 hr tablet Take 100 mg by mouth 2 (two) times daily. 01/21/20   [provider]  metoprolol tartrate (LOPRESSOR) 100 MG tablet Take 1 tablet (100 mg total) by mouth 2 (two) times daily. 01/08/20   Allie Bossier, MD  Multiple Vitamin (MULTIVITAMIN WITH MINERALS) TABS tablet Take 1 tablet by mouth daily. 06/17/19   Hongalgi, Lenis Dickinson, MD  nitroGLYCERIN (NITROSTAT) 0.4 MG SL tablet Place 1 tablet (0.4 mg total) under the tongue every 5 (five) minutes as needed for chest pain. 01/07/20   Allie Bossier, MD  ondansetron (ZOFRAN) 4 MG tablet Take 1 tablet (4 mg total) by mouth every 6 (six) hours as needed for nausea. 01/07/20   Allie Bossier, MD  pantoprazole (PROTONIX) 40 MG tablet Take 1 tablet (40 mg total) by mouth daily. Patient taking differently: Take 40 mg by mouth daily before breakfast.   06/16/19   Hongalgi, Lenis Dickinson, MD  rosuvastatin (CRESTOR) 40 MG tablet Take 1 tablet (40 mg total) by mouth at bedtime. Patient taking differently: Take 40 mg by mouth daily.  06/16/19   Hongalgi, Lenis Dickinson, MD  sodium bicarbonate 650 MG tablet Take 2 tablets (1,300 mg total) by mouth 2 (two) times daily. 01/07/20   Allie Bossier, MD  temazepam (RESTORIL) 15 MG capsule Take 15 mg by mouth daily as needed (for anxiety).     [provider]    Physical Exam:  Constitutional: Frail elderly female to be in no acute distress at this time Vitals:   02/06/20 1049 02/06/20 1051 02/06/20 1059  BP:  114/71   Pulse:  73   Resp:  17   Temp:  (!) 97.3 F (36.3 C)   TempSrc:  Tympanic   SpO2: (S) 90% 90% 96%  Weight:   44.8 kg  Height:   5\' 3"  (1.6 m)   Eyes: PERRL, lids and conjunctivae normal ENMT: Mucous membranes are dry. Posterior pharynx clear of any exudate or lesions. Neck: normal, supple, no masses, no thyromegaly Respiratory: clear to auscultation bilaterally, no wheezing, no crackles. Normal respiratory effort. No accessory muscle use.  Cardiovascular: Regular rate and rhythm, no murmurs / rubs / gallops. No extremity edema. 2+ pedal pulses. No carotid bruits.  Abdomen: no tenderness, no masses palpated. No hepatosplenomegaly. Bowel sounds positive.  Musculoskeletal: no clubbing / cyanosis. No joint deformity upper and lower extremities. Good ROM, no contractures. Normal muscle tone.  Skin: Poor skin turgor Neurologic: CN 2-12 grossly intact. Sensation intact, DTR normal. Strength 5/5 in all 4.  Psychiatric: Normal judgment and insight. Alert and oriented x 3. Normal mood.     Labs on Admission: I have personally reviewed following labs and imaging studies  CBC: Recent Labs  Lab 02/06/20 1225  WBC 12.9*  NEUTROABS 10.2*  HGB 10.9*  HCT 34.1*  MCV 91.2  PLT 409*   Basic Metabolic Panel: Recent Labs  Lab 02/06/20 1225  NA 138  K 4.0  CL 100  CO2 21*  GLUCOSE  127*  BUN 57*  CREATININE 3.76*  CALCIUM 9.4   GFR: Estimated Creatinine Clearance: 9 mL/min (  A) (by C-G formula based on SCr of 3.76 mg/dL (H)). Liver Function Tests: Recent Labs  Lab 02/06/20 1225  AST 35  ALT 31  ALKPHOS 86  BILITOT 0.5  PROT 7.4  ALBUMIN 3.8   Recent Labs  Lab 02/06/20 1225  LIPASE 23   No results for input(s): AMMONIA in the last 168 hours. Coagulation Profile: No results for input(s): INR, PROTIME in the last 168 hours. Cardiac Enzymes: No results for input(s): CKTOTAL, CKMB, CKMBINDEX, TROPONINI in the last 168 hours. BNP (last 3 results) No results for input(s): PROBNP in the last 8760 hours. HbA1C: No results for input(s): HGBA1C in the last 72 hours. CBG: No results for input(s): GLUCAP in the last 168 hours. Lipid Profile: No results for input(s): CHOL, HDL, LDLCALC, TRIG, CHOLHDL, LDLDIRECT in the last 72 hours. Thyroid Function Tests: No results for input(s): TSH, T4TOTAL, FREET4, T3FREE, THYROIDAB in the last 72 hours. Anemia Panel: No results for input(s): VITAMINB12, FOLATE, FERRITIN, TIBC, IRON, RETICCTPCT in the last 72 hours. Urine analysis:    Component Value Date/Time   COLORURINE YELLOW 11/01/2019 0108   APPEARANCEUR CLOUDY (A) 11/01/2019 0108   LABSPEC 1.012 11/01/2019 0108   PHURINE 5.0 11/01/2019 0108   GLUCOSEU NEGATIVE 11/01/2019 0108   HGBUR NEGATIVE 11/01/2019 0108   BILIRUBINUR NEGATIVE 11/01/2019 0108   KETONESUR NEGATIVE 11/01/2019 0108   PROTEINUR 100 (A) 11/01/2019 0108   UROBILINOGEN 0.2 08/24/2015 1737   NITRITE NEGATIVE 11/01/2019 0108   LEUKOCYTESUR LARGE (A) 11/01/2019 0108   Sepsis Labs: No results found for this or any previous visit (from the past 240 hour(s)).   Radiological Exams on Admission: DG Chest Portable 1 View  Result Date: 02/06/2020 CLINICAL DATA:  Shortness of breath. EXAM: PORTABLE CHEST 1 VIEW COMPARISON:  January 05, 2020. FINDINGS: The heart size and mediastinal contours are within  normal limits. No pneumothorax or pleural effusion is noted. Right lung is clear. Minimal left basilar subsegmental atelectasis is noted. The visualized skeletal structures are unremarkable. IMPRESSION: Minimal left basilar subsegmental atelectasis. Electronically Signed   By: Marijo Conception M.D.   On: 02/06/2020 11:35    EKG: Independently reviewed.  Sinus rhythm at 73 bpm with QTc 488  Assessment/Plan Acute renal failure superimposed on chronic kidney disease stage III: Upon admission to the emergency department patient noted to have a creatinine of 3.76 with BUN 57. Baseline creatinine previously 1.8.  Suspecting patient likely dry given signs of hemoconcentration as well as hypotension.  Patient only noted to have 91 mL of urine present on bladder scan.  Of note patient had similar presentation during last hospitalization with acute renal failure last month. -Admit to a medical telemetry bed -Strict intake and output -Follow-up urinalysis once able to be obtained -Check CK  -IV fluids at 75 mL/h -Continue sodium bicarb -Hold furosemide -Repeat BMP in a.m.  Near syncope: Patient presents after having a near syncopal episode.  Denies any loss of consciousness.  Suspect secondary to patient being what appears to be very dehydrated. -check orthostatic vs  Leukocytosis: Acute.  WBC elevated at 12.5.  Chest x-ray appear to be clear.  Urinalysis not available yet.  Unclear symptoms secondary to underlying infection.   -Recheck CBC in a.m.  Normocytic anemia: Patient presents with hemoglobin 10.9, previous baseline appears to range from 7-8 based on her last admission.  The appearance of improvement could be secondary to hemoconcentration. -Continue to monitor  Thrombocytosis: Acute.  Platelet count elevated at 415. -Recheck CBC in  a.m.  Transient hypotension: Resolved.  In route with EMS patient's initial blood pressures were noted to be as low as 78/52.  Blood pressures currently 160/52  after IV fluids. -Continue isosorbide-hydralazine -Slowly add back patient's other home blood pressure medications as tolerated  Peripheral vascular disease -Continue Plavix  Hyperlipidemia -Continue Crestor  DNR: Present on admission.  Patient verified that she has it did not resuscitate order in place.  DVT prophylaxis: Heparin Code Status: DNR Family Communication: Son updated over the phone Disposition Plan: To be determined Consults called: None  Admission status: Inpatient  Norval Morton MD [facility] Pager [phone]   If 7PM-7AM, please contact night-coverage [email or web address] Password [email or web address]  02/06/2020, 2:00 PM

## 2020-02-06 NOTE — ED Triage Notes (Signed)
To ED via [facility] from home-- states she lives with a friend-- pt states that she felt like she was going to pass out so she called 911, c/o shortness of breath also. O2 sats on room air = 90%. Placed on 2L/M/[place] , O2 sats increased to 96%.  Has hx of dementia but is alert/oriented x 4 at present.  Initial BP for EMS was 78/52, received a 300cc bolus of NS, repeat BP was 101/60

## 2020-02-07 LAB — CBC
HCT: 27 % — ABNORMAL LOW (ref 36.0–46.0)
Hemoglobin: 8.7 g/dL — ABNORMAL LOW (ref 12.0–15.0)
MCH: 28.6 pg (ref 26.0–34.0)
MCHC: 32.2 g/dL (ref 30.0–36.0)
MCV: 88.8 fL (ref 80.0–100.0)
Platelets: 336 10*3/uL (ref 150–400)
RBC: 3.04 MIL/uL — ABNORMAL LOW (ref 3.87–5.11)
RDW: 13.7 % (ref 11.5–15.5)
WBC: 8.9 10*3/uL (ref 4.0–10.5)
nRBC: 0 % (ref 0.0–0.2)

## 2020-02-07 LAB — BASIC METABOLIC PANEL
Anion gap: 14 (ref 5–15)
BUN: 53 mg/dL — ABNORMAL HIGH (ref 8–23)
CO2: 20 mmol/L — ABNORMAL LOW (ref 22–32)
Calcium: 8.6 mg/dL — ABNORMAL LOW (ref 8.9–10.3)
Chloride: 103 mmol/L (ref 98–111)
Creatinine, Ser: 3.46 mg/dL — ABNORMAL HIGH (ref 0.44–1.00)
GFR calc Af Amer: 14 mL/min — ABNORMAL LOW (ref >60)
GFR calc non Af Amer: 12 mL/min — ABNORMAL LOW (ref >60)
Glucose, Bld: 100 mg/dL — ABNORMAL HIGH (ref 70–99)
Potassium: 4 mmol/L (ref 3.5–5.1)
Sodium: 137 mmol/L (ref 135–145)

## 2020-02-07 LAB — URINE CULTURE: Culture: NO GROWTH

## 2020-02-07 MED ORDER — LABETALOL HCL 5 MG/ML IV SOLN
10.0000 mg | Freq: Once | INTRAVENOUS | Status: AC
  Administered 2020-02-07: 10 mg via INTRAVENOUS
  Filled 2020-02-07: qty 4

## 2020-02-07 MED ORDER — TEMAZEPAM 15 MG PO CAPS
15.0000 mg | ORAL_CAPSULE | Freq: Every evening | ORAL | Status: DC | PRN
  Administered 2020-02-07 – 2020-02-09 (×3): 15 mg via ORAL
  Filled 2020-02-07 (×3): qty 1

## 2020-02-07 NOTE — Progress Notes (Signed)
PROGRESS NOTE    Alyssa [name]  JOA:[id number] DOB: 10-Apr-1941 DOA: 02/06/2020 PCP: Loretha Brasil, FNP   Brief Narrative:  Alyssa [name] is a 79 y.o. female with medical history significant of HTN, HLD, PVD, CKD, dementia, and syncope presents with complaints of almost passing out at home.  At the time she reports feeling hot and flushed as though she may pass out, but never lost consciousness.  She is currently living with her friend Mr. [name] -.  She had recently been hospitalized from 3/17-3/23 with syncope thought secondary to dehydration and anorexia with acute on chronic kidney disease.  She was discharged to a rehab facility prior to moving in with her friend.  At baseline patient ambulates with the use of a walker.  Since moving in with her friend she reports that she has good and bad days.  On bad days she is unable to get up or move around.  States that her appetite and intake of food and liquids has been adequate.  Other associated symptoms include generalized malaise, intermittent shortness of breath that has more so chronic, urinary frequency, and intermittent diarrhea.  Denies having any falls, dysuria, nausea, vomiting, abdominal pain, chest pain, fever, or cough. En route with EMS patient was noted to have low blood pressures of 78/52.  Patient was given 300 mL of fluid with improvement of blood pressures. Upon admission to the emergency department patient was noted to be afebrile with O2 saturations above 90% on room air with improvement to 96% on 2 L nasal cannula oxygen.  Labs significant for WBC 12.9, 10.9, platelets 415, BUN 57, creatinine 3.76, troponin 17, lactic acid 1, and anion gap 17.  Blood cultures were obtained.  This x-ray showed minimal left basilar segmental atelectasis.  Patient was given 500 mL bolus and TRH called to admit.   Assessment & Plan:   Principal Problem:   Acute renal failure superimposed on chronic kidney disease (HCC) Active  Problems:   PVD (peripheral vascular disease) ([place])   Near syncope   Transient hypotension   DNR (do not resuscitate)   Leukocytosis   Thrombocytosis ([facility])  AKI on CKD 3B, in the setting of poor p.o. intake and failure to thrive  -Patient recently admitted with similar episode of dehydration, prerenal azotemia and AKI subsequently discharged to SNF and recently discharged home with ongoing poor p.o. intake. -Follow I's and O's, IV fluids until patient increases p.o. intake appropriately  -Continue supportive care,, follow repeat labs   Near syncope in the setting of above symptomatic hypotension and likely orthostatic hypotension  -Denies any loss of consciousness falls but continues to complain of "feeling unsteady" she stands up consistent with orthostatic hypotension  -Blood pressure reportedly as low as 70/50 in route from home -improving on IV fluids, hold home antihypertensive medications -Continue IV fluids, follow clinically, PT OT evaluation pending   Leukemoid reaction  -Likely in setting of stress and dehydration with hemoconcentration noted as above  -White count now within normal limits  -Chest x-ray unremarkable, urinalysis unremarkable  -Follow for infectious process or fever, neither which have been noted since admission   Chronic anemia of chronic disease, stable -Baseline around 7-8, currently within normal limits  Thrombocytosis, again likely hemoconcentrated -resolved:  -Currently within normal limits, follow repeat labs  Peripheral vascular disease -Continue Plavix  Hyperlipidemia -Continue Crestor  DVT prophylaxis: Heparin Code Status: DNR Family Communication: Son updated over the phone Disposition Plan: To be determined, pending PT evaluation likely  discharge home with home health versus SNF given patient's weakness, orthostatic hypotension and ongoing recurrent failure to thrive in the setting of poor p.o. intake Consults called: None    Admission status: Inpatient  Subjective: No acute issues or events overnight, patient states she feels less weak but not yet back to baseline.  Declines chest pain, shortness of breath, nausea, vomiting, diarrhea, constipation, headache, fevers, chills.  Objective: Vitals:   02/07/20 0005 02/07/20 0010 02/07/20 0427 02/07/20 0500  BP: (!) 144/83  (!) 144/83   Pulse: 71 70 79   Resp:  15 17   Temp: 98.3 F (36.8 C)  98.8 F (37.1 C)   TempSrc: Oral  Oral   SpO2: 96% 93% 95%   Weight: 43.7 kg   43.7 kg  Height: 5\' 3"  (1.6 m)       Intake/Output Summary (Last 24 hours) at 02/07/2020 0716 Last data filed at 02/07/2020 0500 Gross per 24 hour  Intake 2001.73 ml  Output 350 ml  Net 1651.73 ml   Filed Weights   02/06/20 1059 02/07/20 0005 02/07/20 0500  Weight: 44.8 kg 43.7 kg 43.7 kg    Examination:  General exam: Appears calm and comfortable  Respiratory system: Clear to auscultation. Respiratory effort normal. Cardiovascular system: S1 & S2 heard, RRR. No JVD, murmurs, rubs, gallops or clicks. No pedal edema. Gastrointestinal system: Abdomen is nondistended, soft and nontender. No organomegaly or masses felt. Normal bowel sounds heard. Central nervous system: Alert and oriented. No focal neurological deficits. Extremities: Symmetric 5 x 5 power. Skin: No rashes, lesions or ulcers Psychiatry: Judgement and insight appear normal. Mood & affect appropriate.     Data Reviewed: I have personally reviewed following labs and imaging studies  CBC: Recent Labs  Lab 02/06/20 1225 02/07/20 0421  WBC 12.9* 8.9  NEUTROABS 10.2*  --   HGB 10.9* 8.7*  HCT 34.1* 27.0*  MCV 91.2 88.8  PLT 415* 875   Basic Metabolic Panel: Recent Labs  Lab 02/06/20 1225 02/07/20 0421  NA 138 137  K 4.0 4.0  CL 100 103  CO2 21* 20*  GLUCOSE 127* 100*  BUN 57* 53*  CREATININE 3.76* 3.46*  CALCIUM 9.4 8.6*   GFR: Estimated Creatinine Clearance: 9.5 mL/min (A) (by C-G formula based on  SCr of 3.46 mg/dL (H)). Liver Function Tests: Recent Labs  Lab 02/06/20 1225  AST 35  ALT 31  ALKPHOS 86  BILITOT 0.5  PROT 7.4  ALBUMIN 3.8   Recent Labs  Lab 02/06/20 1225  LIPASE 23   No results for input(s): AMMONIA in the last 168 hours. Coagulation Profile: No results for input(s): INR, PROTIME in the last 168 hours. Cardiac Enzymes: Recent Labs  Lab 02/06/20 1415  CKTOTAL 191   BNP (last 3 results) No results for input(s): PROBNP in the last 8760 hours. HbA1C: No results for input(s): HGBA1C in the last 72 hours. CBG: No results for input(s): GLUCAP in the last 168 hours. Lipid Profile: No results for input(s): CHOL, HDL, LDLCALC, TRIG, CHOLHDL, LDLDIRECT in the last 72 hours. Thyroid Function Tests: No results for input(s): TSH, T4TOTAL, FREET4, T3FREE, THYROIDAB in the last 72 hours. Anemia Panel: No results for input(s): VITAMINB12, FOLATE, FERRITIN, TIBC, IRON, RETICCTPCT in the last 72 hours. Sepsis Labs: Recent Labs  Lab 02/06/20 1225  LATICACIDVEN 1.0    No results found for this or any previous visit (from the past 240 hour(s)).       Radiology Studies: DG Chest Portable 1  View  Result Date: 02/06/2020 CLINICAL DATA:  Shortness of breath. EXAM: PORTABLE CHEST 1 VIEW COMPARISON:  January 05, 2020. FINDINGS: The heart size and mediastinal contours are within normal limits. No pneumothorax or pleural effusion is noted. Right lung is clear. Minimal left basilar subsegmental atelectasis is noted. The visualized skeletal structures are unremarkable. IMPRESSION: Minimal left basilar subsegmental atelectasis. Electronically Signed   By: Marijo Conception M.D.   On: 02/06/2020 11:35        Scheduled Meds: . aspirin EC  81 mg Oral Daily  . citalopram  20 mg Oral Daily  . clopidogrel  75 mg Oral Daily  . diclofenac Sodium  2 g Topical QID  . donepezil  5 mg Oral Daily  . DULoxetine  30 mg Oral Daily  . feeding supplement (ENSURE ENLIVE)  237 mL Oral  BID BM  . gabapentin  100 mg Oral Daily  . heparin  5,000 Units Subcutaneous Q8H  . isosorbide-hydrALAZINE  2 tablet Oral TID  . megestrol  40 mg Oral BID  . rosuvastatin  40 mg Oral Daily  . sodium bicarbonate  1,300 mg Oral BID   Continuous Infusions: . sodium chloride 75 mL/hr at 02/07/20 0500     LOS: 1 day   Time spent: 37min  Thatcher Doberstein C Miley Blanchett, DO [facility]  If 7PM-7AM, please contact night-coverage Pager: Secure chat  02/07/2020, 7:16 AM

## 2020-02-08 DIAGNOSIS — D72823 Leukemoid reaction: Secondary | ICD-10-CM

## 2020-02-08 LAB — CBC
HCT: 26.6 % — ABNORMAL LOW (ref 36.0–46.0)
Hemoglobin: 8.6 g/dL — ABNORMAL LOW (ref 12.0–15.0)
MCH: 29.1 pg (ref 26.0–34.0)
MCHC: 32.3 g/dL (ref 30.0–36.0)
MCV: 89.9 fL (ref 80.0–100.0)
Platelets: 338 10*3/uL (ref 150–400)
RBC: 2.96 MIL/uL — ABNORMAL LOW (ref 3.87–5.11)
RDW: 14.1 % (ref 11.5–15.5)
WBC: 8.8 10*3/uL (ref 4.0–10.5)
nRBC: 0 % (ref 0.0–0.2)

## 2020-02-08 LAB — BASIC METABOLIC PANEL
Anion gap: 12 (ref 5–15)
BUN: 47 mg/dL — ABNORMAL HIGH (ref 8–23)
CO2: 17 mmol/L — ABNORMAL LOW (ref 22–32)
Calcium: 8.9 mg/dL (ref 8.9–10.3)
Chloride: 109 mmol/L (ref 98–111)
Creatinine, Ser: 2.72 mg/dL — ABNORMAL HIGH (ref 0.44–1.00)
GFR calc Af Amer: 19 mL/min — ABNORMAL LOW (ref >60)
GFR calc non Af Amer: 16 mL/min — ABNORMAL LOW (ref >60)
Glucose, Bld: 191 mg/dL — ABNORMAL HIGH (ref 70–99)
Potassium: 4 mmol/L (ref 3.5–5.1)
Sodium: 138 mmol/L (ref 135–145)

## 2020-02-08 NOTE — Progress Notes (Signed)
PROGRESS NOTE    Alyssa [name]  IRW:[id number] DOB: 04-Jun-1941 DOA: 02/06/2020 PCP: Loretha Brasil, FNP   Brief Narrative:  Alyssa [name] is a 79 y.o. female with medical history significant of HTN, HLD, PVD, CKD, dementia, and syncope presents with complaints of almost passing out at home.  At the time she reports feeling hot and flushed as though she may pass out, but never lost consciousness.  She is currently living with her friend Mr. [name] -.  She had recently been hospitalized from 3/17-3/23 with syncope thought secondary to dehydration and anorexia with acute on chronic kidney disease.  She was discharged to a rehab facility prior to moving in with her friend.  At baseline patient ambulates with the use of a walker.  Since moving in with her friend she reports that she has good and bad days.  On bad days she is unable to get up or move around.  States that her appetite and intake of food and liquids has been adequate.  Other associated symptoms include generalized malaise, intermittent shortness of breath that has more so chronic, urinary frequency, and intermittent diarrhea.  Denies having any falls, dysuria, nausea, vomiting, abdominal pain, chest pain, fever, or cough. En route with EMS patient was noted to have low blood pressures of 78/52.  Patient was given 300 mL of fluid with improvement of blood pressures. Upon admission to the emergency department patient was noted to be afebrile with O2 saturations above 90% on room air with improvement to 96% on 2 L nasal cannula oxygen.  Labs significant for WBC 12.9, 10.9, platelets 415, BUN 57, creatinine 3.76, troponin 17, lactic acid 1, and anion gap 17.  Blood cultures were obtained.  This x-ray showed minimal left basilar segmental atelectasis.  Patient was given 500 mL bolus and TRH called to admit.   Assessment & Plan:   Principal Problem:   Acute renal failure superimposed on chronic kidney disease ([place]) Active  Problems:   PVD (peripheral vascular disease) ([place])   Near syncope   Transient hypotension   DNR (do not resuscitate)   Leukocytosis   Thrombocytosis ([facility])   AKI on CKD 3B, in the setting of poor p.o. intake and failure to thrive  -Patient recently admitted with similar episode of dehydration, prerenal azotemia and AKI subsequently discharged to SNF and recently discharged home with ongoing poor p.o. intake. -Creatinine baseline appears to be around 1.7-1.8 per chart review (quite labile during episodes of dehydration) -Follow I's and O's -Continue IV fluids until patient increases p.o. intake appropriately  -Continue supportive care,, follow repeat labs  Lab Results  Component Value Date   CREATININE 2.72 (H) 02/08/2020   CREATININE 3.46 (H) 02/07/2020   CREATININE 3.76 (H) 02/06/2020    Intake/Output Summary (Last 24 hours) at 02/08/2020 1555 Last data filed at 02/08/2020 0700 Gross per 24 hour  Intake 1914.1 ml  Output --  Net 1914.1 ml    Near syncope in the setting of above symptomatic hypotension and likely concurrent orthostatic hypotension  -Denies any loss of consciousness falls but continues to complain of "feeling unsteady" she stands up consistent with orthostatic hypotension  -Blood pressure reportedly as low as 70/50 in route from home -improving on IV fluids, hold home antihypertensive medications -Continue IV fluids, follow clinically, PT OT evaluation pending   Leukemoid reaction, resolved -Likely in setting of stress and dehydration with hemoconcentration noted as above  -White count now within normal limits  -Chest x-ray unremarkable, urinalysis  unremarkable  -Follow for infectious process or fever, neither which have been noted since admission   Chronic anemia of chronic disease, stable -Baseline around 7-8, currently within personal normal limits  Thrombocytosis, again likely hemoconcentrated -resolved:  -Currently within normal limits, follow repeat  labs  Peripheral vascular disease -Continue Plavix  Hyperlipidemia -Continue Crestor  DVT prophylaxis: Heparin Code Status: DNR Family Communication: Son updated over the phone Disposition Plan: To be determined, PT currently recommending discharged with home with home health pending clinical improvement, increasing p.o. intake and ability to tolerate p.o. adequately to maintain hydration levels and nutrition.  Suspect the next 24 to 48 hours patient will be stable for discharge home with home health. Consults called: None  Admission status: Inpatient  Subjective: No acute issues or events overnight, patient states she feels less weak/lethargic but not yet back to baseline.  Looking forward to physical therapy later today. Declines chest pain, shortness of breath, nausea, vomiting, diarrhea, constipation, headache, fevers, chills.  Objective: Vitals:   02/07/20 2355 02/08/20 0015 02/08/20 0110 02/08/20 0309  BP: (!) 167/88 (!) 161/76 (!) 151/85 (!) 153/85  Pulse: 93 94 96 93  Resp: 17 (!) 21 (!) 21 14  Temp:    98.2 F (36.8 C)  TempSrc:    Oral  SpO2: 96% 94% 95% 97%  Weight:    44.9 kg  Height:        Intake/Output Summary (Last 24 hours) at 02/08/2020 0730 Last data filed at 02/08/2020 0400 Gross per 24 hour  Intake 2247.45 ml  Output --  Net 2247.45 ml   Filed Weights   02/07/20 0005 02/07/20 0500 02/08/20 0309  Weight: 43.7 kg 43.7 kg 44.9 kg    Examination:  General:  Pleasantly resting in bed, No acute distress. HEENT:  Normocephalic atraumatic.  Sclerae nonicteric, noninjected.  Extraocular movements intact bilaterally. Neck:  Without mass or deformity.  Trachea is midline. Lungs:  Clear to auscultate bilaterally without rhonchi, wheeze, or rales. Heart:  Regular rate and rhythm.  Without murmurs, rubs, or gallops. Abdomen:  Soft, nontender, nondistended.  Without guarding or rebound. Extremities: Without cyanosis, clubbing, edema, or obvious  deformity. Vascular:  Dorsalis pedis and posterior tibial pulses palpable bilaterally. Skin:  Warm and dry, no erythema, no ulcerations.  Data Reviewed: I have personally reviewed following labs and imaging studies  CBC: Recent Labs  Lab 02/06/20 1225 02/07/20 0421  WBC 12.9* 8.9  NEUTROABS 10.2*  --   HGB 10.9* 8.7*  HCT 34.1* 27.0*  MCV 91.2 88.8  PLT 415* 176   Basic Metabolic Panel: Recent Labs  Lab 02/06/20 1225 02/07/20 0421  NA 138 137  K 4.0 4.0  CL 100 103  CO2 21* 20*  GLUCOSE 127* 100*  BUN 57* 53*  CREATININE 3.76* 3.46*  CALCIUM 9.4 8.6*   GFR: Estimated Creatinine Clearance: 9.8 mL/min (A) (by C-G formula based on SCr of 3.46 mg/dL (H)). Liver Function Tests: Recent Labs  Lab 02/06/20 1225  AST 35  ALT 31  ALKPHOS 86  BILITOT 0.5  PROT 7.4  ALBUMIN 3.8   Recent Labs  Lab 02/06/20 1225  LIPASE 23   No results for input(s): AMMONIA in the last 168 hours. Coagulation Profile: No results for input(s): INR, PROTIME in the last 168 hours. Cardiac Enzymes: Recent Labs  Lab 02/06/20 1415  CKTOTAL 191   BNP (last 3 results) No results for input(s): PROBNP in the last 8760 hours. HbA1C: No results for input(s): HGBA1C in the last  72 hours. CBG: No results for input(s): GLUCAP in the last 168 hours. Lipid Profile: No results for input(s): CHOL, HDL, LDLCALC, TRIG, CHOLHDL, LDLDIRECT in the last 72 hours. Thyroid Function Tests: No results for input(s): TSH, T4TOTAL, FREET4, T3FREE, THYROIDAB in the last 72 hours. Anemia Panel: No results for input(s): VITAMINB12, FOLATE, FERRITIN, TIBC, IRON, RETICCTPCT in the last 72 hours. Sepsis Labs: Recent Labs  Lab 02/06/20 1225  LATICACIDVEN 1.0    Recent Results (from the past 240 hour(s))  Culture, blood (routine x 2)     Status: None (Preliminary result)   Collection Time: 02/06/20 12:11 PM   Specimen: BLOOD  Result Value Ref Range Status   Specimen Description BLOOD SITE NOT SPECIFIED   Final   Special Requests   Final    BOTTLES DRAWN AEROBIC AND ANAEROBIC Blood Culture results may not be optimal due to an inadequate volume of blood received in culture bottles   Culture   Final    NO GROWTH < 24 HOURS Performed at [facility], [place] [address], [place], [place] [address]    Report Status PENDING  Incomplete  Culture, blood (routine x 2)     Status: None (Preliminary result)   Collection Time: 02/06/20  1:00 PM   Specimen: BLOOD RIGHT ARM  Result Value Ref Range Status   Specimen Description BLOOD RIGHT ARM  Final   Special Requests   Final    BOTTLES DRAWN AEROBIC ONLY Blood Culture results may not be optimal due to an inadequate volume of blood received in culture bottles   Culture   Final    NO GROWTH < 24 HOURS Performed at [facility], [place] [address] [place], [place] [address]    Report Status PENDING  Incomplete  Urine culture     Status: None   Collection Time: 02/06/20  2:14 PM   Specimen: Urine, Catheterized  Result Value Ref Range Status   Specimen Description URINE, CATHETERIZED  Final   Special Requests NONE  Final   Culture   Final    NO GROWTH Performed at [facility], [address], [place], [place] [address]    Report Status 02/07/2020 FINAL  Final         Radiology Studies: DG Chest Portable 1 View  Result Date: 02/06/2020 CLINICAL DATA:  Shortness of breath. EXAM: PORTABLE CHEST 1 VIEW COMPARISON:  January 05, 2020. FINDINGS: The heart size and mediastinal contours are within normal limits. No pneumothorax or pleural effusion is noted. Right lung is clear. Minimal left basilar subsegmental atelectasis is noted. The visualized skeletal structures are unremarkable. IMPRESSION: Minimal left basilar subsegmental atelectasis. Electronically Signed   By: Marijo Conception M.D.   On: 02/06/2020 11:35        Scheduled Meds: . aspirin EC  81 mg Oral Daily  . citalopram  20 mg Oral Daily  . clopidogrel  75 mg Oral Daily  .  diclofenac Sodium  2 g Topical QID  . donepezil  5 mg Oral Daily  . DULoxetine  30 mg Oral Daily  . feeding supplement (ENSURE ENLIVE)  237 mL Oral BID BM  . gabapentin  100 mg Oral Daily  . heparin  5,000 Units Subcutaneous Q8H  . isosorbide-hydrALAZINE  2 tablet Oral TID  . megestrol  40 mg Oral BID  . rosuvastatin  40 mg Oral Daily  . sodium bicarbonate  1,300 mg Oral BID   Continuous Infusions: . sodium chloride 75  mL/hr at 02/08/20 0400     LOS: 2 days   Time spent: 3min  Saloni Lablanc C Ardean Simonich, DO [facility]  If 7PM-7AM, please contact night-coverage Pager: [place] chat  02/08/2020, 7:30 AM

## 2020-02-08 NOTE — Evaluation (Signed)
Occupational Therapy Evaluation Patient Details Name: Alyssa [name] MRN: [id number] DOB: 09/02/1941 Today's Date: 02/08/2020    History of Present Illness Patient is a 79 y/o female who presents with SOB and near syncope. Admitted with AKI on CKD, in the setting of poor p.o. intake and failure to thrive. Also with near syncope in the setting of symptomatic hypotension and likely orthostatic hypotension.  CXR-Minimal left basilar subsegmental atelectasis. PMH includes PVD. HTN, CKD, dementia, Rt THA.   Clinical Impression   PTA pt living with friend, whom assist with IADLs and driving. At time of eval, noted cognitive deficits in memory, awareness, and problem solving. Pt incontinent of stool at start of session without awareness, was able to complete posterior peri care with set up A and cues. Pt completed household level of functional mobility at min guard with RW and safety cues. No orthostatics noted-  Supine BP 163/84 Sitting BP 165/100 Standing BP 178/99 asymptomatic with position changes. HR up to 136 bpm with activity.  Recommend HHOT to safely progress BADL in home environment. Will continue to follow per POC listed below.     Follow Up Recommendations  Home health OT;Supervision/Assistance - 24 hour    Equipment Recommendations  None recommended by OT    Recommendations for Other Services       Precautions / Restrictions Precautions Precautions: Fall Restrictions Weight Bearing Restrictions: No      Mobility Bed Mobility Overal bed mobility: Modified Independent             General bed mobility comments: Use of rail and HOB slightly elevated.  Transfers Overall transfer level: Needs assistance Equipment used: Rolling walker (2 wheeled) Transfers: Sit to/from Stand Sit to Stand: Min guard         General transfer comment: Min guard for safety. Stood from [employer], from toilet x1, transferred to chair at end of session    Balance Overall balance  assessment: Needs assistance Sitting-balance support: Feet supported;No upper extremity supported Sitting balance-Leahy Scale: Fair     Standing balance support: During functional activity Standing balance-Leahy Scale: Poor Standing balance comment: Requires at least 1 UE support. Able to stand at sink and wash hands with min guard.                           ADL either performed or assessed with clinical judgement   ADL Overall ADL's : Needs assistance/impaired Eating/Feeding: Set up;Sitting   Grooming: Min guard;Cueing for safety;Cueing for sequencing;Standing;Wash/dry hands Grooming Details (indicate cue type and reason): cues to sequence tasks, rubbing soap on hands then walking away without rinsing or drying Upper Body Bathing: Set up;Sitting   Lower Body Bathing: Set up;Sitting/lateral leans;Sit to/from stand   Upper Body Dressing : Set up;Sitting   Lower Body Dressing: Set up;Sitting/lateral leans;Sit to/from stand   Toilet Transfer: Min guard;Ambulation;Regular Toilet;Grab bars;RW Armed forces technical officer Details (indicate cue type and reason): to toilet in room, min guard for safety and cues for safe hand placement on grab bars Toileting- Clothing Manipulation and Hygiene: Set up;Sitting/lateral lean Toileting - Clothing Manipulation Details (indicate cue type and reason): pt incontinent of stool on arrival, with cues was able to clean posterior peri care with lateral leans on toilet     Functional mobility during ADLs: Min guard;Rolling walker       Vision Patient Visual Report: No change from baseline       Perception     Praxis  Pertinent Vitals/Pain Pain Assessment: Faces Faces Pain Scale: Hurts even more Pain Location: head Pain Descriptors / Indicators: Headache Pain Intervention(s): Monitored during session     Hand Dominance Right   Extremity/Trunk Assessment Upper Extremity Assessment Upper Extremity Assessment: Generalized weakness    Lower Extremity Assessment Lower Extremity Assessment: Defer to PT evaluation RLE Sensation: decreased light touch(foot) LLE Sensation: decreased light touch(foot)   Cervical / Trunk Assessment Cervical / Trunk Assessment: Kyphotic   Communication Communication Communication: HOH   Cognition Arousal/Alertness: Awake/alert Behavior During Therapy: WFL for tasks assessed/performed Overall Cognitive Status: No family/caregiver present to determine baseline cognitive functioning                                 General Comments: Pt with known hx of dementia. Likely near or at baseline? Pt found sitting in stool upon arrival without awareness. Also rubbing soap into hands for a good while and walking away without washing hands with water needing cues to sequence appropriately.   General Comments  Supine BP 163/84, sitting BP 165/100, standing BP 178/99, asymptomatic with position changes.    Exercises     Shoulder Instructions      Home Living Family/patient expects to be discharged to:: Private residence Living Arrangements: Spouse/significant other(friend?) Available Help at Discharge: Family;Available 24 hours/day Type of Home: House Home Access: Stairs to enter [employer] of Steps: 3 Entrance Stairs-Rails: Right;Left;Can reach both Home Layout: One level     Bathroom Shower/Tub: Teacher, early years/pre: Handicapped height Bathroom Accessibility: Yes How Accessible: Accessible via walker Home Equipment: [place] - 2 wheels;Walker - 4 wheels;Shower seat   Additional Comments: 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      Prior Functioning/Environment Level of Independence: Independent with assistive device(s)        Comments: reports using a Rollator for mobility. Does her own ADLs and friend helps with IADLs/driving.        OT Problem List: Decreased strength;Decreased knowledge of use of DME or  AE;Decreased activity tolerance;Decreased cognition;Impaired balance (sitting and/or standing);Decreased safety awareness      OT Treatment/Interventions: Self-care/ADL training;Therapeutic exercise;Patient/family education;Balance training;Energy conservation;Therapeutic activities;DME and/or AE instruction;Cognitive remediation/compensation    OT Goals(Current goals can be found in the care plan section) Acute Rehab OT Goals Patient Stated Goal: to get home OT Goal Formulation: With patient Time For Goal Achievement: 02/22/20 Potential to Achieve Goals: Good  OT Frequency: Min 2X/week   Barriers to D/C:            Co-evaluation              AM-PAC OT "6 Clicks" Daily Activity     Outcome Measure Help from another person eating meals?: None Help from another person taking care of personal grooming?: None Help from another person toileting, which includes using toliet, bedpan, or urinal?: A Little Help from another person bathing (including washing, rinsing, drying)?: A Little Help from another person to put on and taking off regular upper body clothing?: None Help from another person to put on and taking off regular lower body clothing?: A Little 6 Click Score: 21   End of Session Equipment Utilized During Treatment: Gait belt;Rolling walker Nurse Communication: Mobility status  Activity Tolerance: Patient tolerated treatment well Patient left: in chair;with call bell/phone within reach;with chair alarm set  OT Visit Diagnosis: Unsteadiness on feet (R26.81);Other abnormalities of gait and  mobility (R26.89);Muscle weakness (generalized) (M62.81);Other symptoms and signs involving cognitive function                Time: 1137-1204 OT Time Calculation (min): 27 min Charges:  OT General Charges $OT Visit: 1 Visit OT Evaluation $OT Eval Moderate Complexity: [place], MSOT, OTR/L [facility] Office Number: [phone] Pager:  [phone]  Zenovia Jarred 02/08/2020, 1:38 PM

## 2020-02-08 NOTE — Evaluation (Signed)
Physical Therapy Evaluation Patient Details Name: Alyssa [name] MRN: [id number] DOB: [birth year] Today's Date: 02/08/2020   History of Present Illness  Patient is a 79 y/o female who presents with SOB and near syncope. Admitted with AKI on CKD, in the setting of poor p.o. intake and failure to thrive. Also with near syncope in the setting of symptomatic hypotension and likely orthostatic hypotension.  CXR-Minimal left basilar subsegmental atelectasis. PMH includes PVD. HTN, CKD, dementia, Rt THA.  Clinical Impression  Patient presents with headache, impaired sensation, impaired balance, impaired cognition and impaired mobility s/p above. Pt reports living with her friend, using rollator for ambulation and doing her own ADLs PTA. Today, pt tolerated bed mobility, transfers and gait training with min guard for safety. Pt is not orthostatic. See BP below. Supine BP 163/84 Sitting BP 165/100 Standing BP 178/99 asymptomatic with position changes. HR up to 136 bpm with activity.  Will follow acutely to maximize independence and mobility prior to return home.     Follow Up Recommendations Home health PT;Supervision for mobility/OOB    Equipment Recommendations  None recommended by PT    Recommendations for Other Services       Precautions / Restrictions Precautions Precautions: Fall Restrictions Weight Bearing Restrictions: No      Mobility  Bed Mobility Overal bed mobility: Modified Independent             General bed mobility comments: Use of rail and HOB slightly elevated.  Transfers Overall transfer level: Needs assistance Equipment used: Rolling walker (2 wheeled) Transfers: Sit to/from Stand Sit to Stand: Min guard         General transfer comment: Min guard for safety. Stood from [employer], from toilet x1, transferred to chair post ambulation.  Ambulation/Gait Ambulation/Gait assistance: Min guard Gait Distance (Feet): 300 Feet Assistive device: Rolling  walker (2 wheeled) Gait Pattern/deviations: Step-through pattern;Decreased stride length;Trunk flexed   Gait velocity interpretation: 1.31 - 2.62 ft/sec, indicative of limited community ambulator General Gait Details: Mildly unsteady gait with cues for RW proximity and upright posture. Walks on right toes (RLE shorter than LLE due to discrepancy). HR up to 136 bpm.  Stairs            Wheelchair Mobility    Modified Rankin (Stroke Patients Only)       Balance Overall balance assessment: Needs assistance Sitting-balance support: Feet supported;No upper extremity supported Sitting balance-Leahy Scale: Fair     Standing balance support: During functional activity Standing balance-Leahy Scale: Poor Standing balance comment: Requires at least 1 UE support. Able to stand at sink and wash hands with min guard.                             Pertinent Vitals/Pain Pain Assessment: Faces Faces Pain Scale: Hurts even more Pain Location: head Pain Descriptors / Indicators: Headache Pain Intervention(s): Repositioned;Monitored during session    [place] expects to be discharged to:: Private residence Living Arrangements: Spouse/significant other(friend?) Available Help at Discharge: Family;Available 24 hours/day Type of Home: House Home Access: Stairs to enter Entrance Stairs-Rails: Right;Left;Can reach both Entrance Stairs-Number of Steps: 3 Home Layout: One level Home Equipment: Walker - 2 wheels;Walker - 4 wheels;Shower seat Additional Comments: 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    Prior Function Level of Independence: Independent with assistive device(s)         Comments: reports using a Rollator for mobility.  Does her own ADLs and friend helps with IADLs/driving.     Hand Dominance   Dominant Hand: Right    Extremity/Trunk Assessment   Upper Extremity Assessment Upper Extremity Assessment: Defer to  OT evaluation    Lower Extremity Assessment Lower Extremity Assessment: RLE deficits/detail;LLE deficits/detail;Generalized weakness RLE Sensation: decreased light touch(foot) LLE Sensation: decreased light touch(foot)    Cervical / Trunk Assessment Cervical / Trunk Assessment: Kyphotic  Communication   Communication: HOH  Cognition Arousal/Alertness: Awake/alert Behavior During Therapy: WFL for tasks assessed/performed Overall Cognitive Status: No family/caregiver present to determine baseline cognitive functioning                                 General Comments: Pt with known hx of dementia. Likely near or at baseline? Pt found sitting in stool upon arrival without awareness. Also rubbing soap into hands for a good while and walking away without washing hands with water needing cues.      General Comments General comments (skin integrity, edema, etc.): Supine BP 163/84, sitting BP 165/100, standing BP 178/99, asymptomatic with position changes.    Exercises     Assessment/Plan    PT Assessment Patient needs continued PT services  PT Problem List Pain;Decreased balance;Impaired sensation;Decreased cognition;Decreased safety awareness       PT Treatment Interventions Therapeutic activities;Gait training;Therapeutic exercise;Patient/family education;Balance training;Stair training;Functional mobility training    PT Goals (Current goals can be found in the Care Plan section)  Acute Rehab PT Goals Patient Stated Goal: to get home PT Goal Formulation: With patient Time For Goal Achievement: 02/22/20 Potential to Achieve Goals: Good    Frequency Min 3X/week   Barriers to discharge Inaccessible home environment stairs    Co-evaluation               AM-PAC PT "6 Clicks" Mobility  Outcome Measure Help needed turning from your back to your side while in a flat bed without using bedrails?: None Help needed moving from lying on your back to sitting on  the side of a flat bed without using bedrails?: A Little Help needed moving to and from a bed to a chair (including a wheelchair)?: A Little Help needed standing up from a chair using your arms (e.g., wheelchair or bedside chair)?: A Little Help needed to walk in hospital room?: A Little Help needed climbing 3-5 steps with a railing? : A Little 6 Click Score: 19    End of Session   Activity Tolerance: Patient tolerated treatment well Patient left: in chair;with call bell/phone within reach;with chair alarm set Nurse Communication: Mobility status PT Visit Diagnosis: Pain;Unsteadiness on feet (R26.81) Pain - part of body: (head)    Time: 4765-4650 PT Time Calculation (min) (ACUTE ONLY): 27 min   Charges:   PT Evaluation $PT Eval Moderate Complexity: 1 Mod          Marisa Severin, PT, DPT Acute Rehabilitation Services Pager [phone] Office [phone]      Marguarite Arbour A Sabra Heck 02/08/2020, 1:09 PM

## 2020-02-08 NOTE — Plan of Care (Signed)
  Problem: Education: Goal: Knowledge of General Education information will improve Description Including pain rating scale, medication(s)/side effects and non-pharmacologic comfort measures Outcome: Progressing   Problem: Health Behavior/Discharge Planning: Goal: Ability to manage health-related needs will improve Outcome: Progressing   

## 2020-02-09 LAB — BASIC METABOLIC PANEL
Anion gap: 9 (ref 5–15)
BUN: 38 mg/dL — ABNORMAL HIGH (ref 8–23)
CO2: 20 mmol/L — ABNORMAL LOW (ref 22–32)
Calcium: 8.8 mg/dL — ABNORMAL LOW (ref 8.9–10.3)
Chloride: 108 mmol/L (ref 98–111)
Creatinine, Ser: 2.19 mg/dL — ABNORMAL HIGH (ref 0.44–1.00)
GFR calc Af Amer: 25 mL/min — ABNORMAL LOW (ref >60)
GFR calc non Af Amer: 21 mL/min — ABNORMAL LOW (ref >60)
Glucose, Bld: 109 mg/dL — ABNORMAL HIGH (ref 70–99)
Potassium: 4.2 mmol/L (ref 3.5–5.1)
Sodium: 137 mmol/L (ref 135–145)

## 2020-02-09 LAB — CBC
HCT: 25.1 % — ABNORMAL LOW (ref 36.0–46.0)
Hemoglobin: 8.2 g/dL — ABNORMAL LOW (ref 12.0–15.0)
MCH: 28.9 pg (ref 26.0–34.0)
MCHC: 32.7 g/dL (ref 30.0–36.0)
MCV: 88.4 fL (ref 80.0–100.0)
Platelets: 323 10*3/uL (ref 150–400)
RBC: 2.84 MIL/uL — ABNORMAL LOW (ref 3.87–5.11)
RDW: 13.8 % (ref 11.5–15.5)
WBC: 10.3 10*3/uL (ref 4.0–10.5)
nRBC: 0 % (ref 0.0–0.2)

## 2020-02-09 MED ORDER — CARVEDILOL 12.5 MG PO TABS
12.5000 mg | ORAL_TABLET | Freq: Two times a day (BID) | ORAL | Status: DC
  Administered 2020-02-09 – 2020-02-10 (×2): 12.5 mg via ORAL
  Filled 2020-02-09 (×2): qty 1

## 2020-02-09 MED ORDER — HYDRALAZINE HCL 20 MG/ML IJ SOLN
10.0000 mg | Freq: Once | INTRAMUSCULAR | Status: AC
  Administered 2020-02-09: 10 mg via INTRAVENOUS
  Filled 2020-02-09: qty 1

## 2020-02-09 NOTE — [place] Notes (Signed)
PROGRESS NOTE    Alyssa [name]  HFW:[id number] DOB: Jan 15, 1941 DOA: 02/06/2020 PCP: Loretha Brasil, FNP   Brief Narrative:  Alyssa [name] is a 79 y.o. female with medical history significant of HTN, HLD, PVD, CKD, dementia, and syncope presents with complaints of almost passing out at home.  At the time she reports feeling hot and flushed as though she may pass out, but never lost consciousness.  She is currently living with her friend Mr. [name] -.  She had recently been hospitalized from 3/17-3/23 with syncope thought secondary to dehydration and anorexia with acute on chronic kidney disease.  She was discharged to a rehab facility prior to moving in with her friend.  At baseline patient ambulates with the use of a walker.  Since moving in with her friend she reports that she has good and bad days.  On bad days she is unable to get up or move around.  States that her appetite and intake of food and liquids has been adequate.  Other associated symptoms include generalized malaise, intermittent shortness of breath that has more so chronic, urinary frequency, and intermittent diarrhea.  Denies having any falls, dysuria, nausea, vomiting, abdominal pain, chest pain, fever, or cough. En route with EMS patient was noted to have low blood pressures of 78/52.  Patient was given 300 mL of fluid with improvement of blood pressures. Upon admission to the emergency department patient was noted to be afebrile with O2 saturations above 90% on room air with improvement to 96% on 2 L nasal cannula oxygen.  Labs significant for WBC 12.9, 10.9, platelets 415, BUN 57, creatinine 3.76, troponin 17, lactic acid 1, and anion gap 17.  Blood cultures were obtained.  This x-ray showed minimal left basilar segmental atelectasis.  Patient was given 500 mL bolus and TRH called to admit.   Assessment & Plan:   Principal Problem:   Acute renal failure superimposed on chronic kidney disease ([place]) Active  Problems:   PVD (peripheral vascular disease) ([place])   Near syncope   Transient hypotension   DNR (do not resuscitate)   Leukocytosis   Thrombocytosis ([facility])   AKI on CKD 3B, in the setting of poor p.o. intake and failure to thrive, POA, improving -Patient recently admitted with similar episode of dehydration, prerenal azotemia and AKI subsequently discharged to SNF and recently discharged home with ongoing poor p.o. intake. -Creatinine baseline appears to be around 1.7-1.8 per chart review (quite labile during episodes of dehydration) -Follow I's and O's -Continue IV fluids until patient increases p.o. intake appropriately  -Continue supportive care,, follow repeat labs  Lab Results  Component Value Date   CREATININE 2.19 (H) 02/09/2020   CREATININE 2.72 (H) 02/08/2020   CREATININE 3.46 (H) 02/07/2020    Intake/Output Summary (Last 24 hours) at 02/09/2020 0743 Last data filed at 02/09/2020 0500 Gross per 24 hour  Intake 2216.51 ml  Output --  Net 2216.51 ml   Near syncope in the setting of above symptomatic hypotension and likely concurrent orthostatic hypotension  -Denies any loss of consciousness falls but continues to complain of "feeling unsteady" she stands up consistent with orthostatic hypotension  -Blood pressure reportedly as low as 70/50 in route from home -improving on IV fluids, hold home antihypertensive medications -Continue IV fluids, follow clinically, PT OT evaluation pending   Hypertension, essential, poorly controlled  Transition from metoprolol to carvedilol, continue patient's home isosorbide  Leukemoid reaction, resolved -Likely in setting of stress and dehydration with hemoconcentration  noted as above  -White count now within normal limits  -Chest x-ray unremarkable, urinalysis unremarkable  -Follow for infectious process or fever, neither which have been noted since admission   Chronic anemia of chronic disease, stable -Baseline around 7-8, currently  within personal normal limits  Thrombocytosis, again likely hemoconcentrated -resolved:  -Currently within normal limits, follow repeat labs  Peripheral vascular disease -Continue Plavix  Hyperlipidemia -Continue Crestor  DVT prophylaxis: Heparin Code Status: DNR Family Communication: Son updated over the phone Disposition Plan:  Likely discharge home with home health in the next 24 to 48 hours pending clinical improvement, improvement in creatinine, p.o. intake, urine output as well as blood pressure control.   Consults called: None  Admission status: Inpatient  Subjective: No acute issues or events overnight, patient having loose stool this morning but otherwise feels minimally improved, continues to tolerate p.o. somewhat poorly, we discussed that if her intake increases over the next 24 hours and her creatinine continues to improve would consider discharge home with home health and therapy which she is looking forward to.  Denies chest pain, nausea, vomiting, headache, fevers, chills.  Objective: Vitals:   02/08/20 0747 02/08/20 1736 02/08/20 2032 02/09/20 0434  BP: (!) 166/93 (!) 200/102 (!) 171/87 (!) 183/100  Pulse: 88 93  (!) 108  Resp: 18 20  18   Temp: 98.5 F (36.9 C) 98.1 F (36.7 C) 98.7 F (37.1 C) 98.7 F (37.1 C)  TempSrc: Oral Oral Oral Oral  SpO2: 98% 99%  97%  Weight:    45.6 kg  Height:        Intake/Output Summary (Last 24 hours) at 02/09/2020 0743 Last data filed at 02/09/2020 0500 Gross per 24 hour  Intake 2216.51 ml  Output --  Net 2216.51 ml   Filed Weights   02/07/20 0500 02/08/20 0309 02/09/20 0434  Weight: 43.7 kg 44.9 kg 45.6 kg    Examination:  General:  Pleasantly resting in bed, No acute distress. HEENT:  Normocephalic atraumatic.  Sclerae nonicteric, noninjected.  Extraocular movements intact bilaterally. Neck:  Without mass or deformity.  Trachea is midline. Lungs:  Clear to auscultate bilaterally without rhonchi, wheeze, or  rales. Heart:  Regular rate and rhythm.  Without murmurs, rubs, or gallops. Abdomen:  Soft, nontender, nondistended.  Without guarding or rebound. Extremities: Without cyanosis, clubbing, edema, or obvious deformity. Vascular:  Dorsalis pedis and posterior tibial pulses palpable bilaterally. Skin:  Warm and dry, no erythema, no ulcerations.  Data Reviewed: I have personally reviewed following labs and imaging studies  CBC: Recent Labs  Lab 02/06/20 1225 02/07/20 0421 02/08/20 0837 02/09/20 0240  WBC 12.9* 8.9 8.8 10.3  NEUTROABS 10.2*  --   --   --   HGB 10.9* 8.7* 8.6* 8.2*  HCT 34.1* 27.0* 26.6* 25.1*  MCV 91.2 88.8 89.9 88.4  PLT 415* 336 338 242   Basic Metabolic Panel: Recent Labs  Lab 02/06/20 1225 02/07/20 0421 02/08/20 0837 02/09/20 0240  NA 138 137 138 137  K 4.0 4.0 4.0 4.2  CL 100 103 109 108  CO2 21* 20* 17* 20*  GLUCOSE 127* 100* 191* 109*  BUN 57* 53* 47* 38*  CREATININE 3.76* 3.46* 2.72* 2.19*  CALCIUM 9.4 8.6* 8.9 8.8*   GFR: Estimated Creatinine Clearance: 15.7 mL/min (A) (by C-G formula based on SCr of 2.19 mg/dL (H)). Liver Function Tests: Recent Labs  Lab 02/06/20 1225  AST 35  ALT 31  ALKPHOS 86  BILITOT 0.5  PROT 7.4  ALBUMIN  3.8   Recent Labs  Lab 02/06/20 1225  LIPASE 23   No results for input(s): AMMONIA in the last 168 hours. Coagulation Profile: No results for input(s): INR, PROTIME in the last 168 hours. Cardiac Enzymes: Recent Labs  Lab 02/06/20 1415  CKTOTAL 191   BNP (last 3 results) No results for input(s): PROBNP in the last 8760 hours. HbA1C: No results for input(s): HGBA1C in the last 72 hours. CBG: No results for input(s): GLUCAP in the last 168 hours. Lipid Profile: No results for input(s): CHOL, HDL, LDLCALC, TRIG, CHOLHDL, LDLDIRECT in the last 72 hours. Thyroid Function Tests: No results for input(s): TSH, T4TOTAL, FREET4, T3FREE, THYROIDAB in the last 72 hours. Anemia Panel: No results for input(s):  VITAMINB12, FOLATE, FERRITIN, TIBC, IRON, RETICCTPCT in the last 72 hours. Sepsis Labs: Recent Labs  Lab 02/06/20 1225  LATICACIDVEN 1.0    Recent Results (from the past 240 hour(s))  Culture, blood (routine x 2)     Status: None (Preliminary result)   Collection Time: 02/06/20 12:11 PM   Specimen: BLOOD  Result Value Ref Range Status   Specimen Description BLOOD SITE NOT SPECIFIED  Final   Special Requests   Final    BOTTLES DRAWN AEROBIC AND ANAEROBIC Blood Culture results may not be optimal due to an inadequate volume of blood received in culture bottles   Culture   Final    NO GROWTH 2 DAYS Performed at [facility], [place] [address] [place], [place] [address]    Report Status PENDING  Incomplete  Culture, blood (routine x 2)     Status: None (Preliminary result)   Collection Time: 02/06/20  1:00 PM   Specimen: BLOOD RIGHT ARM  Result Value Ref Range Status   Specimen Description BLOOD RIGHT ARM  Final   Special Requests   Final    BOTTLES DRAWN AEROBIC ONLY Blood Culture results may not be optimal due to an inadequate volume of blood received in culture bottles   Culture   Final    NO GROWTH 2 DAYS Performed at [facility], [place] [address], [place], [place] [address]    Report Status PENDING  Incomplete  Urine culture     Status: None   Collection Time: 02/06/20  2:14 PM   Specimen: Urine, Catheterized  Result Value Ref Range Status   Specimen Description URINE, CATHETERIZED  Final   Special Requests NONE  Final   Culture   Final    NO GROWTH Performed at [facility], [address] [place], [place] [address]    Report Status 02/07/2020 FINAL  Final         Radiology Studies: No results found.      Scheduled Meds: . aspirin EC  81 mg Oral Daily  . citalopram  20 mg Oral Daily  . clopidogrel  75 mg Oral Daily  . diclofenac Sodium  2 g Topical QID  . donepezil  5 mg Oral Daily  . DULoxetine  30 mg Oral Daily  . feeding supplement  (ENSURE ENLIVE)  237 mL Oral BID BM  . gabapentin  100 mg Oral Daily  . heparin  5,000 Units Subcutaneous Q8H  . isosorbide-hydrALAZINE  2 tablet Oral TID  . megestrol  40 mg Oral BID  . rosuvastatin  40 mg Oral Daily  . sodium bicarbonate  1,300 mg Oral BID   Continuous Infusions: . sodium chloride 75 mL/hr at 02/09/20 0500     LOS:  3 days   Time spent: 26min  Renia Mikelson C Stanislawa Gaffin, DO [facility]  If 7PM-7AM, please contact night-coverage Pager: Secure chat  02/09/2020, 7:43 AM

## 2020-02-09 NOTE — [place] Notes (Signed)
Pt assisted to wash up after being incontinent of stool.  HR up to 150 during toileting and washing.  Down to 103 once in bed, however BP quite elevated.  Message sent to MD.  Will con't to monitor.

## 2020-02-10 LAB — BASIC METABOLIC PANEL
Anion gap: 10 (ref 5–15)
BUN: 39 mg/dL — ABNORMAL HIGH (ref 8–23)
CO2: 18 mmol/L — ABNORMAL LOW (ref 22–32)
Calcium: 9.2 mg/dL (ref 8.9–10.3)
Chloride: 110 mmol/L (ref 98–111)
Creatinine, Ser: 2.11 mg/dL — ABNORMAL HIGH (ref 0.44–1.00)
GFR calc Af Amer: 26 mL/min — ABNORMAL LOW (ref >60)
GFR calc non Af Amer: 22 mL/min — ABNORMAL LOW (ref >60)
Glucose, Bld: 94 mg/dL (ref 70–99)
Potassium: 4.5 mmol/L (ref 3.5–5.1)
Sodium: 138 mmol/L (ref 135–145)

## 2020-02-10 LAB — CBC
HCT: 25 % — ABNORMAL LOW (ref 36.0–46.0)
Hemoglobin: 8.1 g/dL — ABNORMAL LOW (ref 12.0–15.0)
MCH: 28.5 pg (ref 26.0–34.0)
MCHC: 32.4 g/dL (ref 30.0–36.0)
MCV: 88 fL (ref 80.0–100.0)
Platelets: 311 10*3/uL (ref 150–400)
RBC: 2.84 MIL/uL — ABNORMAL LOW (ref 3.87–5.11)
RDW: 14 % (ref 11.5–15.5)
WBC: 8.5 10*3/uL (ref 4.0–10.5)
nRBC: 0 % (ref 0.0–0.2)

## 2020-02-10 MED ORDER — CARVEDILOL 12.5 MG PO TABS: 12.5000 mg | ORAL_TABLET | Freq: Two times a day (BID) | ORAL | 0 refills | Status: DC

## 2020-02-10 MED ORDER — AMLODIPINE BESYLATE 5 MG PO TABS: 5.0000 mg | ORAL_TABLET | Freq: Every day | ORAL | 0 refills | Status: DC

## 2020-02-10 MED ORDER — AMLODIPINE BESYLATE 5 MG PO TABS
5.0000 mg | ORAL_TABLET | Freq: Every day | ORAL | Status: DC
  Administered 2020-02-10: 5 mg via ORAL
  Filled 2020-02-10: qty 1

## 2020-02-10 NOTE — [place] Notes (Signed)
Pt was discharged today to home with son.  Pt's IV removed.  Pt taken off telemetry and CCMD notified.  Pt left with all of their personal belongings.  AVS documentation reviewed with Pt and all questions answered.

## 2020-02-10 NOTE — Discharge Summary (Signed)
Physician Discharge Summary  [name] LEX:[id number] DOB: 01-Mar-1941 DOA: 02/06/2020  PCP: Loretha Brasil, FNP  Admit date: 02/06/2020 Discharge date: 02/10/2020  Admitted From: Home Disposition: Home with home health  Recommendations for Outpatient Follow-up:  1. Follow up with PCP in 1-2 weeks 2. Please obtain BMP/CBC in one week  Home Health: yes Equipment/Devices: None  Discharge Condition: Stable CODE STATUS: DNR Diet recommendation: As tolerated  Brief/Interim Summary: Alyssa [name] a 79 y.o.femalewith medical history significant ofHTN, HLD, PVD, CKD, dementia, and syncope presents with complaints of almost passing out at home. At the time she reports feeling hot and flushed as though she may pass out, but never lost consciousness. She is currently living with her friend Mr. [name] -. She had recently been hospitalized from 3/17-3/23 with syncope thought secondary to dehydration and anorexia with acute on chronic kidney disease. She was discharged to a rehab facility prior to moving in with her friend. At baseline patient ambulates with the use of a walker. Since moving in with her friend she reports that she has good and bad days. On bad days she is unable to get up or move around. States that her appetite and intake of food and liquids has been adequate. Other associated symptoms include generalized malaise, intermittent shortness of breath that has more so chronic, urinary frequency, and intermittent diarrhea. Denies having any falls,dysuria, nausea, vomiting, abdominal pain, chest pain, fever, or cough. En route with EMS patient was noted to have low blood pressures of 78/52. Patient was given 300 mL of fluid with improvement of blood pressures. Upon admission to the emergency department patient was noted to be afebrile with O2 saturations above 90% on room air with improvement to 96% on 2 L nasal cannula oxygen. Labs significant for WBC 12.9,  10.9, platelets 415, BUN 57, creatinine 3.76, troponin17, lactic acid 1, and anion gap 17. Blood cultures were obtained. This x-ray showed minimal left basilar segmental atelectasis. Patient was given 500 mL bolus and TRH called to admit  As above with recurrent episode of poor p.o. intake, dehydration and essentially failure to thrive.  Patient was just seen in our facility for similar episode late March just some 3 weeks ago subsequently discharged with apparently ongoing poor p.o. intake.  Patient was symptomatic hypotensive and creatinine as well above baseline at intake.  With IV fluids and advancement of diet patient is now tolerating p.o. quite well and creatinine is downtrending appropriately.  Patient's antihypertensives were held at admission due to transient hypotension as above as low as 78/52, patient's blood pressure medications were restarted patient's blood pressure remains somewhat elevated, as such patient was also given additional amlodipine and transition to carvedilol with moderately well-controlled blood pressure.  At this time patient is otherwise stable and agreeable for discharge, will need to see PCP in the next 3 to 5 days for further evaluation and treatment, to ensure ongoing appropriate p.o. intake as well as for further evaluation of new antihypertensive medications to ensure adequate blood pressure control.  Discharge Diagnoses:  Principal Problem:   Acute renal failure superimposed on chronic kidney disease ([place]) Active Problems:   PVD (peripheral vascular disease) ([place])   Near syncope   Transient hypotension   DNR (do not resuscitate)   Leukocytosis   Thrombocytosis [facility])    Discharge Instructions  Discharge Instructions    Call MD for:  difficulty breathing, headache or visual disturbances   Complete by: As directed    Call MD for:  extreme fatigue   Complete by: As directed    Call MD for:  hives   Complete by: As directed    Call MD for:  persistant  dizziness or light-headedness   Complete by: As directed    Call MD for:  persistant nausea and vomiting   Complete by: As directed    Call MD for:  severe uncontrolled pain   Complete by: As directed    Call MD for:  temperature >100.4   Complete by: As directed    Diet - low sodium heart healthy   Complete by: As directed    Increase activity slowly   Complete by: As directed      Allergies as of 02/10/2020   No Known Allergies     Medication List    STOP taking these medications   metoprolol succinate 100 MG 24 hr tablet Commonly known as: TOPROL-XL     TAKE these medications   acetaminophen 325 MG tablet Commonly known as: TYLENOL Take 2 tablets (650 mg total) by mouth every 6 (six) hours as needed for mild pain, moderate pain or fever (or Fever >/= 101).   amLODipine 5 MG tablet Commonly known as: NORVASC Take 1 tablet (5 mg total) by mouth daily. Start taking on: February 11, 2020   aspirin 81 MG EC tablet Take 1 tablet (81 mg total) by mouth daily.   busPIRone 15 MG tablet Commonly known as: BUSPAR Take 15 mg by mouth 3 (three) times daily as needed for anxiety.   carvedilol 12.5 MG tablet Commonly known as: COREG Take 1 tablet (12.5 mg total) by mouth 2 (two) times daily with a meal.   citalopram 20 MG tablet Commonly known as: CeleXA Take 1 tablet (20 mg total) by mouth daily.   clopidogrel 75 MG tablet Commonly known as: PLAVIX Take 1 tablet (75 mg total) by mouth daily.   diclofenac Sodium 1 % Gel Commonly known as: VOLTAREN Apply 2 g topically 4 (four) times daily.   donepezil 5 MG tablet Commonly known as: ARICEPT Take 5 mg by mouth daily.   DULoxetine 30 MG capsule Commonly known as: CYMBALTA Take 30 mg by mouth daily.   feeding supplement (ENSURE ENLIVE) Liqd Take 237 mLs by mouth 2 (two) times daily between meals.   furosemide 20 MG tablet Commonly known as: Lasix Take 2 tablets (40 mg total) by mouth daily as needed for fluid (Fluid  overload.  Ensure patient maintains euvolemia.  Also I will for hyperkalemia).   gabapentin 100 MG capsule Commonly known as: NEURONTIN Take 100 mg by mouth 3 (three) times daily.   Iron High-Potency 325 MG Tabs Take 325 mg by mouth daily with breakfast.   isosorbide-hydrALAZINE 20-37.5 MG tablet Commonly known as: BIDIL Take 2 tablets by mouth 3 (three) times daily.   megestrol 40 MG tablet Commonly known as: MEGACE Take 40 mg by mouth 2 (two) times daily.   multivitamin with minerals Tabs tablet Take 1 tablet by mouth daily.   nitroGLYCERIN 0.4 MG SL tablet Commonly known as: NITROSTAT Place 1 tablet (0.4 mg total) under the tongue every 5 (five) minutes as needed for chest pain.   ondansetron 4 MG tablet Commonly known as: ZOFRAN Take 1 tablet (4 mg total) by mouth every 6 (six) hours as needed for nausea.   pantoprazole 40 MG tablet Commonly known as: PROTONIX Take 1 tablet (40 mg total) by mouth daily. What changed: when to take this   ProAir HFA 108 (  90 Base) MCG/ACT inhaler Generic drug: albuterol Inhale 2 puffs into the lungs every 6 (six) hours as needed for wheezing or shortness of breath.   rosuvastatin 40 MG tablet Commonly known as: CRESTOR Take 1 tablet (40 mg total) by mouth at bedtime. What changed: when to take this   sodium bicarbonate 650 MG tablet Take 2 tablets (1,300 mg total) by mouth 2 (two) times daily.   temazepam 15 MG capsule Commonly known as: RESTORIL Take 15 mg by mouth daily as needed (for anxiety).       No Known Allergies  Procedures/Studies: DG Chest Portable 1 View  Result Date: 02/06/2020 CLINICAL DATA:  Shortness of breath. EXAM: PORTABLE CHEST 1 VIEW COMPARISON:  January 05, 2020. FINDINGS: The heart size and mediastinal contours are within normal limits. No pneumothorax or pleural effusion is noted. Right lung is clear. Minimal left basilar subsegmental atelectasis is noted. The visualized skeletal structures are  unremarkable. IMPRESSION: Minimal left basilar subsegmental atelectasis. Electronically Signed   By: Marijo Conception M.D.   On: 02/06/2020 11:35    Subjective: No acute issues or events overnight, blood pressure borderline elevated this morning but new medications were started as above, patient otherwise feels quite well denies headache, fever, chills, chest pain, shortness of breath, nausea, vomiting, diarrhea, constipation.   Discharge Exam: Vitals:   02/10/20 1000 02/10/20 1012  BP: (!) 185/101 (!) 191/108  Pulse: 70 84  Resp: 13 20  Temp: 98.7 F (37.1 C)   SpO2: 98% 98%   Vitals:   02/10/20 0800 02/10/20 0821 02/10/20 1000 02/10/20 1012  BP:  (!) 215/120 (!) 185/101 (!) 191/108  Pulse:  98 70 84  Resp:  20 13 20   Temp: 98.8 F (37.1 C) 98.8 F (37.1 C) 98.7 F (37.1 C)   TempSrc:  Oral Oral   SpO2:  97% 98% 98%  Weight:      Height:        General: Thin appearing female, pleasantly resting in bed, No acute distress. HEENT:  Normocephalic atraumatic.  Sclerae nonicteric, noninjected.  Extraocular movements intact bilaterally. Neck:  Without mass or deformity.  Trachea is midline. Lungs:  Clear to auscultate bilaterally without rhonchi, wheeze, or rales. Heart:  Regular rate and rhythm.  Without murmurs, rubs, or gallops. Abdomen:  Soft, nontender, nondistended.  Without guarding or rebound. Extremities: Without cyanosis, clubbing, edema, or obvious deformity. Vascular:  Dorsalis pedis and posterior tibial pulses palpable bilaterally. Skin:  Warm and dry, no erythema, no ulcerations.   The results of significant diagnostics from this hospitalization (including imaging, microbiology, ancillary and laboratory) are listed below for reference.     Microbiology: Recent Results (from the past 240 hour(s))  Culture, blood (routine x 2)     Status: None (Preliminary result)   Collection Time: 02/06/20 12:11 PM   Specimen: BLOOD  Result Value Ref Range Status   Specimen  Description BLOOD SITE NOT SPECIFIED  Final   Special Requests   Final    BOTTLES DRAWN AEROBIC AND ANAEROBIC Blood Culture results may not be optimal due to an inadequate volume of blood received in culture bottles   Culture   Final    NO GROWTH 3 DAYS Performed at [facility], [place] [address], [place], [place] [address]    Report Status PENDING  Incomplete  Culture, blood (routine x 2)     Status: None (Preliminary result)   Collection Time: 02/06/20  1:00 PM   Specimen: BLOOD RIGHT ARM  Result Value Ref Range Status   Specimen Description BLOOD RIGHT ARM  Final   Special Requests   Final    BOTTLES DRAWN AEROBIC ONLY Blood Culture results may not be optimal due to an inadequate volume of blood received in culture bottles   Culture   Final    NO GROWTH 3 DAYS Performed at [facility], [place] [address], [place], [place] [address]    Report Status PENDING  Incomplete  Urine culture     Status: None   Collection Time: 02/06/20  2:14 PM   Specimen: Urine, Catheterized  Result Value Ref Range Status   Specimen Description URINE, CATHETERIZED  Final   Special Requests NONE  Final   Culture   Final    NO GROWTH Performed at [facility], [address] [place], [place] [address]    Report Status 02/07/2020 FINAL  Final     Labs: BNP (last 3 results) Recent Labs    03/22/19 2128 01/02/20 1430  BNP 161.5* 79.8   Basic Metabolic Panel: Recent Labs  Lab 02/06/20 1225 02/07/20 0421 02/08/20 0837 02/09/20 0240 02/10/20 0332  NA 138 137 138 137 138  K 4.0 4.0 4.0 4.2 4.5  CL 100 103 109 108 110  CO2 21* 20* 17* 20* 18*  GLUCOSE 127* 100* 191* 109* 94  BUN 57* 53* 47* 38* 39*  CREATININE 3.76* 3.46* 2.72* 2.19* 2.11*  CALCIUM 9.4 8.6* 8.9 8.8* 9.2   Liver Function Tests: Recent Labs  Lab 02/06/20 1225  AST 35  ALT 31  ALKPHOS 86  BILITOT 0.5  PROT 7.4  ALBUMIN 3.8   Recent Labs  Lab 02/06/20 1225  LIPASE 23   No results for input(s):  AMMONIA in the last 168 hours. CBC: Recent Labs  Lab 02/06/20 1225 02/07/20 0421 02/08/20 0837 02/09/20 0240 02/10/20 0332  WBC 12.9* 8.9 8.8 10.3 8.5  NEUTROABS 10.2*  --   --   --   --   HGB 10.9* 8.7* 8.6* 8.2* 8.1*  HCT 34.1* 27.0* 26.6* 25.1* 25.0*  MCV 91.2 88.8 89.9 88.4 88.0  PLT 415* 336 338 323 311   Cardiac Enzymes: Recent Labs  Lab 02/06/20 1415  CKTOTAL 191   BNP: Invalid input(s): POCBNP CBG: No results for input(s): GLUCAP in the last 168 hours. D-Dimer No results for input(s): DDIMER in the last 72 hours. Hgb A1c No results for input(s): HGBA1C in the last 72 hours. Lipid Profile No results for input(s): CHOL, HDL, LDLCALC, TRIG, CHOLHDL, LDLDIRECT in the last 72 hours. Thyroid function studies No results for input(s): TSH, T4TOTAL, T3FREE, THYROIDAB in the last 72 hours.  Invalid input(s): FREET3 Anemia work up No results for input(s): VITAMINB12, FOLATE, FERRITIN, TIBC, IRON, RETICCTPCT in the last 72 hours. Urinalysis    Component Value Date/Time   COLORURINE STRAW (A) 02/06/2020 1112   APPEARANCEUR CLEAR 02/06/2020 1112   LABSPEC 1.008 02/06/2020 1112   PHURINE 7.0 02/06/2020 1112   GLUCOSEU NEGATIVE 02/06/2020 1112   HGBUR NEGATIVE 02/06/2020 1112   BILIRUBINUR NEGATIVE 02/06/2020 1112   KETONESUR NEGATIVE 02/06/2020 1112   PROTEINUR 100 (A) 02/06/2020 1112   UROBILINOGEN 0.2 08/24/2015 1737   NITRITE NEGATIVE 02/06/2020 1112   LEUKOCYTESUR NEGATIVE 02/06/2020 1112   Sepsis Labs Invalid input(s): PROCALCITONIN,  WBC,  LACTICIDVEN Microbiology Recent Results (from the past 240 hour(s))  Culture, blood (routine x 2)     Status: None (Preliminary result)   Collection Time: 02/06/20 12:11 PM  Specimen: BLOOD  Result Value Ref Range Status   Specimen Description BLOOD SITE NOT SPECIFIED  Final   Special Requests   Final    BOTTLES DRAWN AEROBIC AND ANAEROBIC Blood Culture results may not be optimal due to an inadequate volume of blood  received in culture bottles   Culture   Final    NO GROWTH 3 DAYS Performed at [facility], [place] [address] [place], [place] [address]    Report Status PENDING  Incomplete  Culture, blood (routine x 2)     Status: None (Preliminary result)   Collection Time: 02/06/20  1:00 PM   Specimen: BLOOD RIGHT ARM  Result Value Ref Range Status   Specimen Description BLOOD RIGHT ARM  Final   Special Requests   Final    BOTTLES DRAWN AEROBIC ONLY Blood Culture results may not be optimal due to an inadequate volume of blood received in culture bottles   Culture   Final    NO GROWTH 3 DAYS Performed at [facility], [place] [address], [place], [place] [address]    Report Status PENDING  Incomplete  Urine culture     Status: None   Collection Time: 02/06/20  2:14 PM   Specimen: Urine, Catheterized  Result Value Ref Range Status   Specimen Description URINE, CATHETERIZED  Final   Special Requests NONE  Final   Culture   Final    NO GROWTH Performed at [facility], [address] [place], [place] [address]    Report Status 02/07/2020 FINAL  Final     Time coordinating discharge: Over 30 minutes  SIGNED:   Little Ishikawa, DO [facility] 02/10/2020, 1:39 PM Pager   If 7PM-7AM, please contact night-coverage [email or web address]

## 2020-02-10 NOTE — TOC Transition Note (Signed)
Transition of Care [facility]) - CM/SW Discharge Note   Patient Details  Name: ASIANNA BRUNDAGE MRN: [id number] Date of Birth: [birth year]  [facility] CM/SW Contact:  Verdell Carmine, RN Phone Number: 02/10/2020, 2:29 PM   Clinical Narrative:    Admitted with syncope. Returning home today, Rehabilitation recommended resuming PT and OT at home. Lives with friend.  Orders in the system, Alvis Lemmings had seen patient previously. Cory in [place] aware of patient. Patient states no equipment needed at home.    Final next level of care: Home/Self Care Barriers to Discharge: No Barriers Identified   Patient Goals and CMS Choice Patient states their goals for this hospitalization and ongoing recovery are:: Go home with Home health   Choice offered to / list presented to : Patient  Discharge Placement  Home with home health :PT/OT                     Discharge Plan and Services  PT/OT                        HH Arranged: PT, OT [place] Agency: [facility]now [facility]) Alvis Lemmings will take patient Date [place]: 02/10/20 Time [place]: [place] Representative spoke with at [place]: [place] Better From [place] ([place]) Interventions     Readmission Risk Interventions Readmission Risk Prevention Plan 12/26/2018  Transportation Screening Complete  Medication Review (RN Care Manager) Complete  PCP or Specialist appointment within 3-5 days of discharge Not Complete  PCP/Specialist Appt Not Complete comments pt for snf. Snf MD to see as needed.  [place] or Home Care Consult Not Complete  HRI or Home Care Consult Pt Refusal Comments NA  SW Recovery Care/Counseling Consult Not Complete  SW Consult Not Complete Comments NA  Palliative Care Screening Complete  Skilled Nursing Facility Complete  Some recent data might be hidden

## 2020-02-11 LAB — CULTURE, BLOOD (ROUTINE X 2)
Culture: NO GROWTH
Culture: NO GROWTH

## 2020-03-13 ENCOUNTER — Other Ambulatory Visit: Payer: Self-pay | Admitting: General Practice

## 2020-03-14 ENCOUNTER — Other Ambulatory Visit: Payer: Self-pay | Admitting: General Practice

## 2020-03-14 ENCOUNTER — Ambulatory Visit
Admission: RE | Admit: 2020-03-14 | Discharge: 2020-03-14 | Disposition: A | Discharge status: Home / Self Care | Payer: Medicare HMO | Source: Ambulatory Visit | Attending: General Practice | Admitting: General Practice

## 2020-03-14 ENCOUNTER — Other Ambulatory Visit: Payer: Self-pay

## 2020-03-14 DIAGNOSIS — R062 Wheezing: Secondary | ICD-10-CM

## 2020-03-14 DIAGNOSIS — I130 Hypertensive heart and chronic kidney disease with heart failure and stage 1 through stage 4 chronic kidney disease, or unspecified chronic kidney disease: Secondary | ICD-10-CM

## 2020-04-03 ENCOUNTER — Other Ambulatory Visit: Payer: Self-pay | Admitting: Family

## 2020-04-03 DIAGNOSIS — R1900 Intra-abdominal and pelvic swelling, mass and lump, unspecified site: Secondary | ICD-10-CM

## 2020-04-03 DIAGNOSIS — R109 Unspecified abdominal pain: Secondary | ICD-10-CM

## 2020-04-07 ENCOUNTER — Other Ambulatory Visit: Payer: Self-pay | Admitting: General Practice

## 2020-04-07 DIAGNOSIS — F17200 Nicotine dependence, unspecified, uncomplicated: Secondary | ICD-10-CM

## 2020-04-14 ENCOUNTER — Ambulatory Visit (INDEPENDENT_AMBULATORY_CARE_PROVIDER_SITE_OTHER): Payer: Medicare HMO | Admitting: Cardiovascular Disease

## 2020-04-14 ENCOUNTER — Encounter: Payer: Self-pay | Admitting: Cardiovascular Disease

## 2020-04-14 DIAGNOSIS — I6523 Occlusion and stenosis of bilateral carotid arteries: Secondary | ICD-10-CM

## 2020-04-14 DIAGNOSIS — N184 Chronic kidney disease, stage 4 (severe): Secondary | ICD-10-CM

## 2020-04-14 DIAGNOSIS — Z720 Tobacco use: Secondary | ICD-10-CM

## 2020-04-14 DIAGNOSIS — I5189 Other ill-defined heart diseases: Secondary | ICD-10-CM

## 2020-04-14 DIAGNOSIS — I214 Non-ST elevation (NSTEMI) myocardial infarction: Secondary | ICD-10-CM | POA: Diagnosis not present

## 2020-04-14 DIAGNOSIS — E7800 Pure hypercholesterolemia, unspecified: Secondary | ICD-10-CM | POA: Diagnosis not present

## 2020-04-14 DIAGNOSIS — I517 Cardiomegaly: Secondary | ICD-10-CM | POA: Diagnosis not present

## 2020-04-14 DIAGNOSIS — I10 Essential (primary) hypertension: Secondary | ICD-10-CM

## 2020-04-14 DIAGNOSIS — I519 Heart disease, unspecified: Secondary | ICD-10-CM

## 2020-04-14 MED ORDER — AMLODIPINE BESYLATE 5 MG PO TABS: 7.5000 mg | ORAL_TABLET | Freq: Every day | ORAL | 0 refills | Status: DC

## 2020-04-14 NOTE — Patient Instructions (Addendum)
Medication Instructions:  INCREASE YOUR AMLODIPINE TO 7.5MG  DAILY (1.5 TABS)  *If you need a refill on your cardiac medications before your next appointment, please call your pharmacy*   Lab Work: FASTING LABS: CMET CBC TSH LIPID HGBA1C  If you have labs (blood work) drawn today and your tests are completely normal, you will receive your results only by: Marland Kitchen MyChart Message (if you have MyChart) OR . A paper copy in the mail If you have any lab test that is abnormal or we need to change your treatment, we will call you to review the results.   Testing/Procedures: Your physician has requested that you have an echocardiogram. Echocardiography is a painless test that uses sound waves to create images of your heart. It provides your doctor with information about the size and shape of your heart and how well your heart's chambers and valves are working. This procedure takes approximately one hour. There are no restrictions for this procedure.    Follow-Up: At [facility], you and your health needs are our priority.  As part of our continuing mission to provide you with exceptional heart care, we have created designated Provider Care Teams.  These Care Teams include your primary Cardiologist (physician) and Advanced Practice Providers (APPs -  Physician Assistants and Nurse Practitioners) who all work together to provide you with the care you need, when you need it.  We recommend signing up for the patient portal called "MyChart".  Sign up information is provided on this After Visit Summary.  MyChart is used to connect with patients for Virtual Visits (Telemedicine).  Patients are able to view lab/test results, encounter notes, upcoming appointments, etc.  Non-urgent messages can be sent to your provider as well.   To learn more about what you can do with MyChart, go to [email or web address].    Your next appointment:   2-3 month(s)  The format for your next appointment:   In Person   Provider:   Shelva Majestic, MD
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
## 2020-04-15 IMAGING — US US RENAL
1 series · 14 of 25 positions shown · non-contrast
Comparison: Lumbar spine CT 05/17/2016.

CLINICAL DATA: 77-year-old female acute renal failure.

EXAM:
RENAL / URINARY TRACT ULTRASOUND COMPLETE

[Series 1: us renal · 14 of 31 slices shown]
[im 1/31]
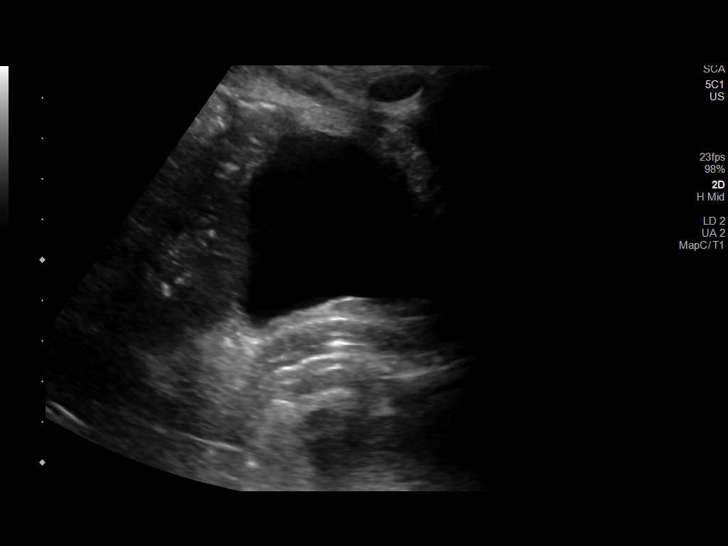
[im 3/31]
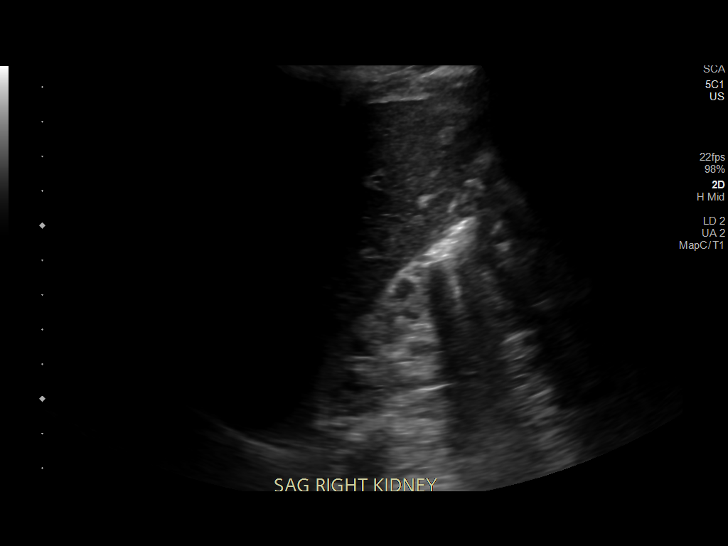
[im 6/31]
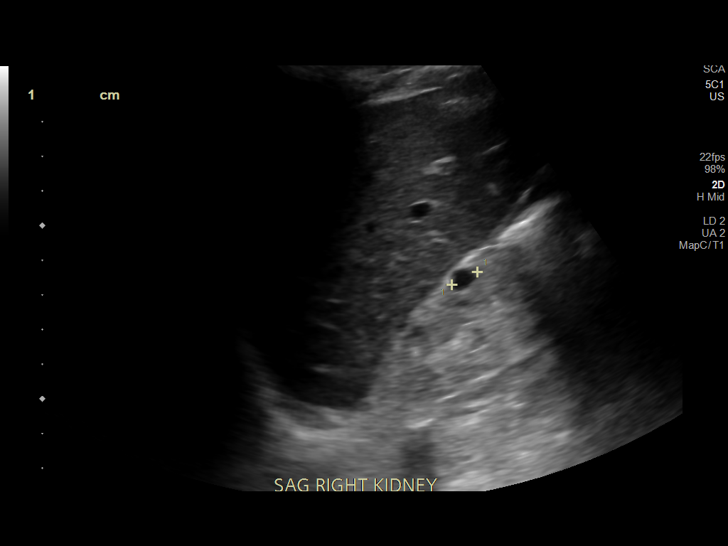
[im 8/31]
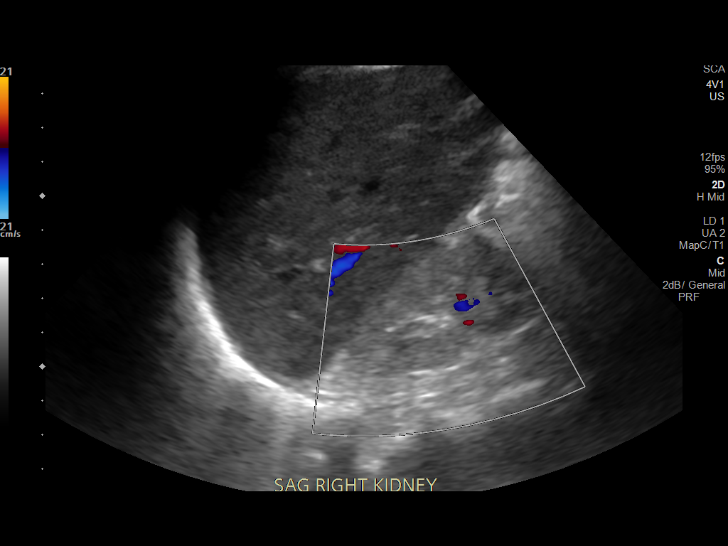
[im 11/31]
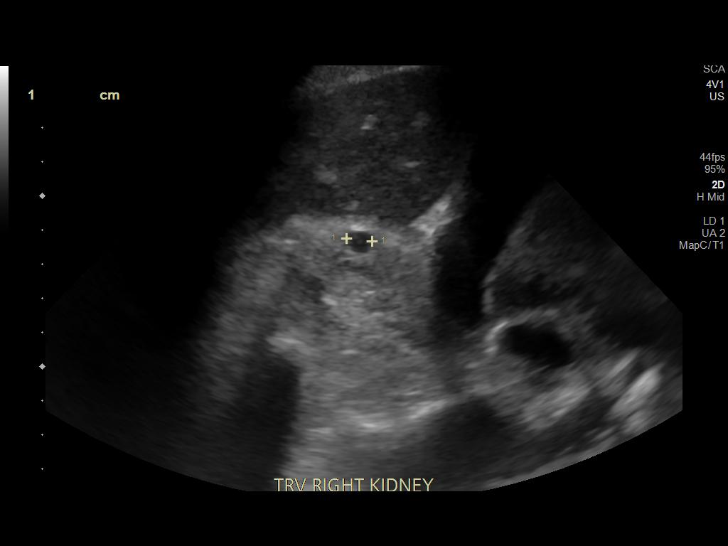
[im 12/31]
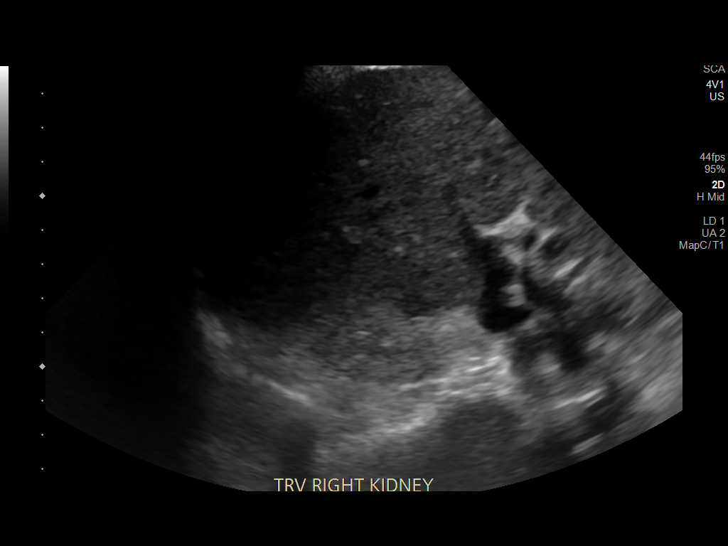
[im 14/31]
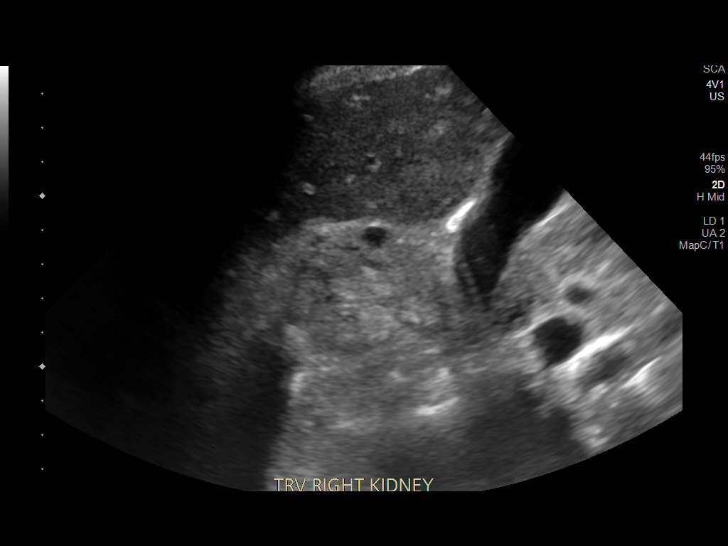
[im 17/31]
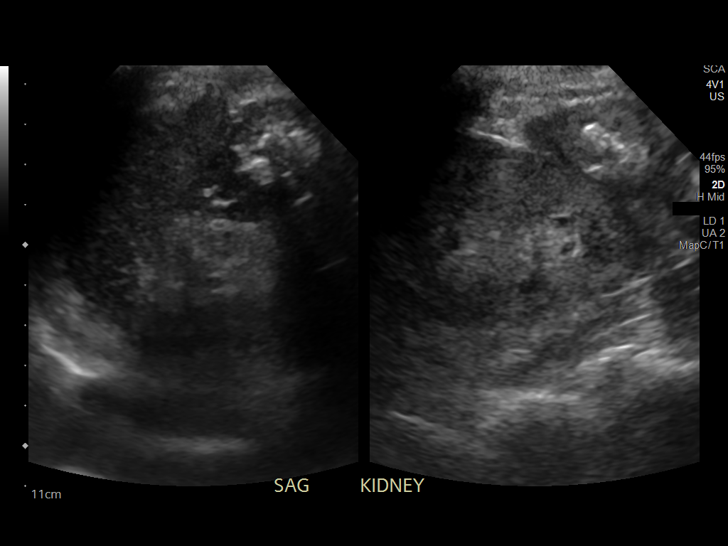
[im 19/31]
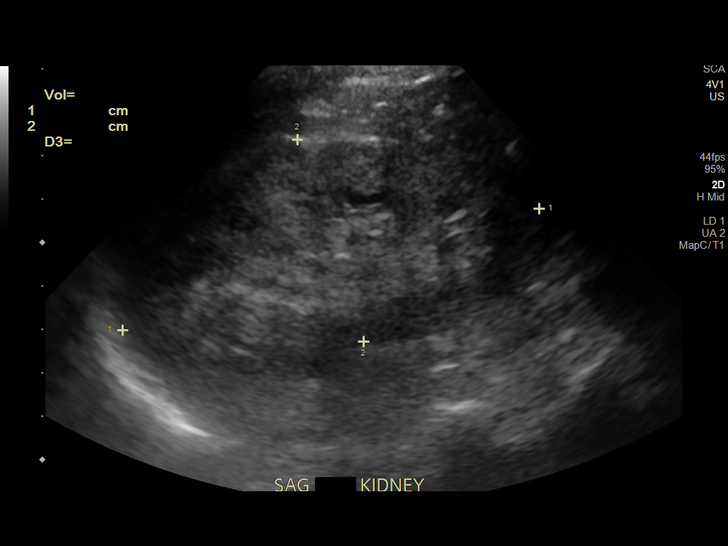
[im 21/31]
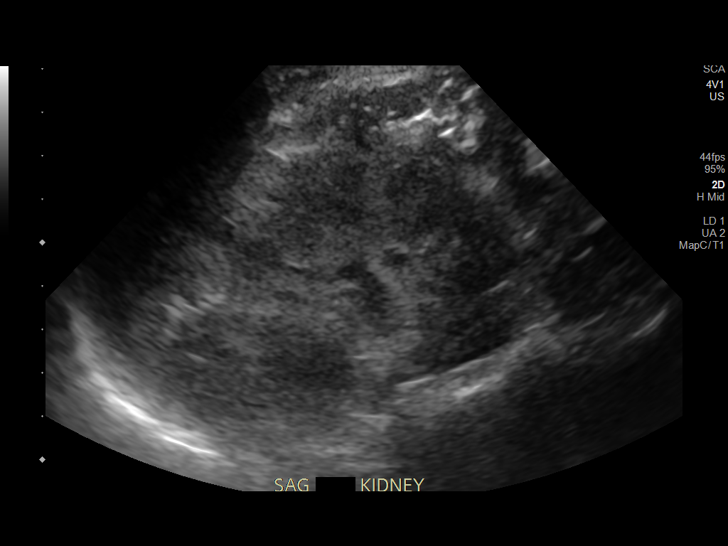
[im 23/31]
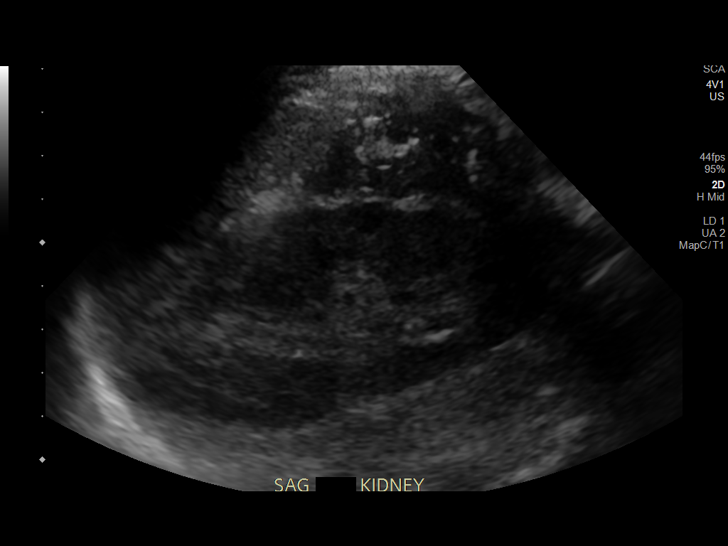
[im 26/31]
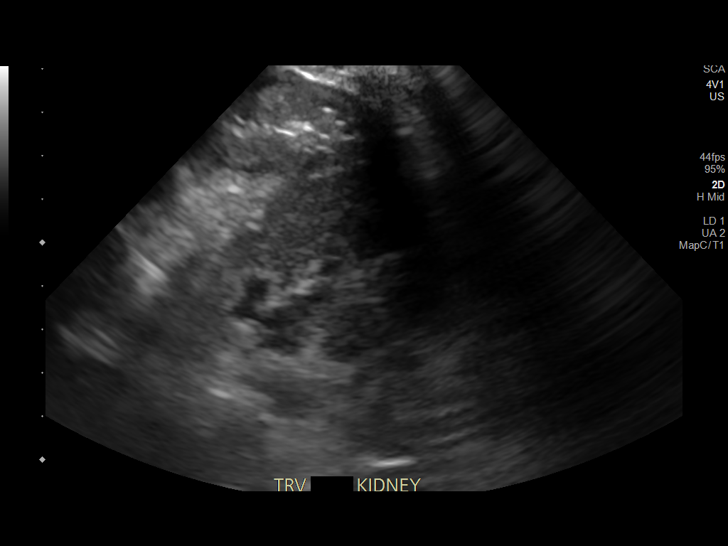
[im 28/31]
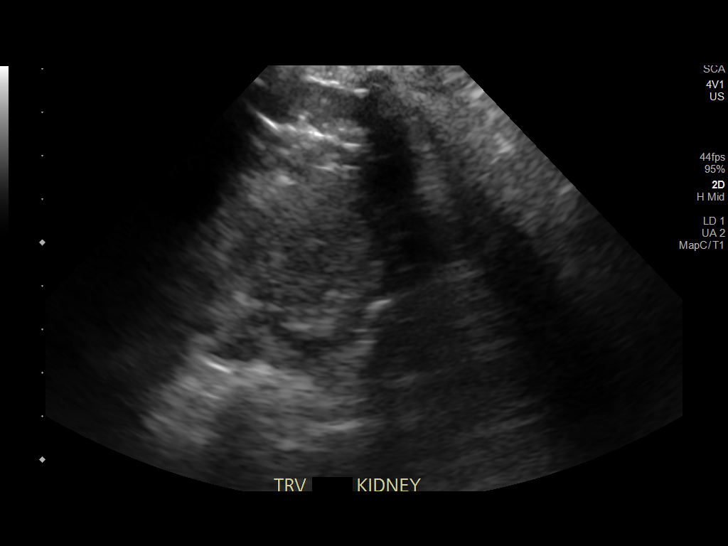
[im 31/31]
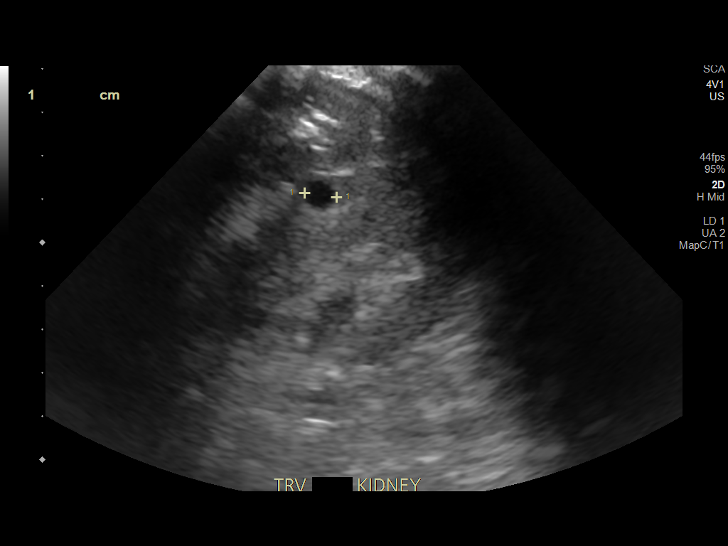

[14 of 25 positions shown; findings below may reference images not displayed]

FINDINGS: Right Kidney:

Renal measurements: 6.9 x 2.5 x 3.6 centimeters = volume: 33 mL.
Small echogenic right kidney (image 8). No hydronephrosis. Small
simple appearing 8 millimeters cyst.

Left Kidney:

Renal measurements: 10.0 x 4.9 x 4.8 centimeters = volume: 22 mL.
Echogenic left kidney (image 18). Mild asymmetric prominence of the
left renal collecting system (image 27), but no overt
hydronephrosis. Small 8 millimeter simple appearing cyst.

Bladder:

Appears normal for degree of bladder distention.
IMPRESSION: 1. Echogenic kidneys compatible with chronic medical renal disease.
Asymmetric right renal atrophy.
2. Asymmetrically larger left renal collecting system, but no overt
hydronephrosis.

## 2020-04-15 IMAGING — DX PORTABLE CHEST - 1 VIEW
1 series · 1 of 1 positions shown · non-contrast
Comparison: 12/27/2018

CLINICAL DATA: Shortness of breath

EXAM:
PORTABLE CHEST 1 VIEW

[chest ap]
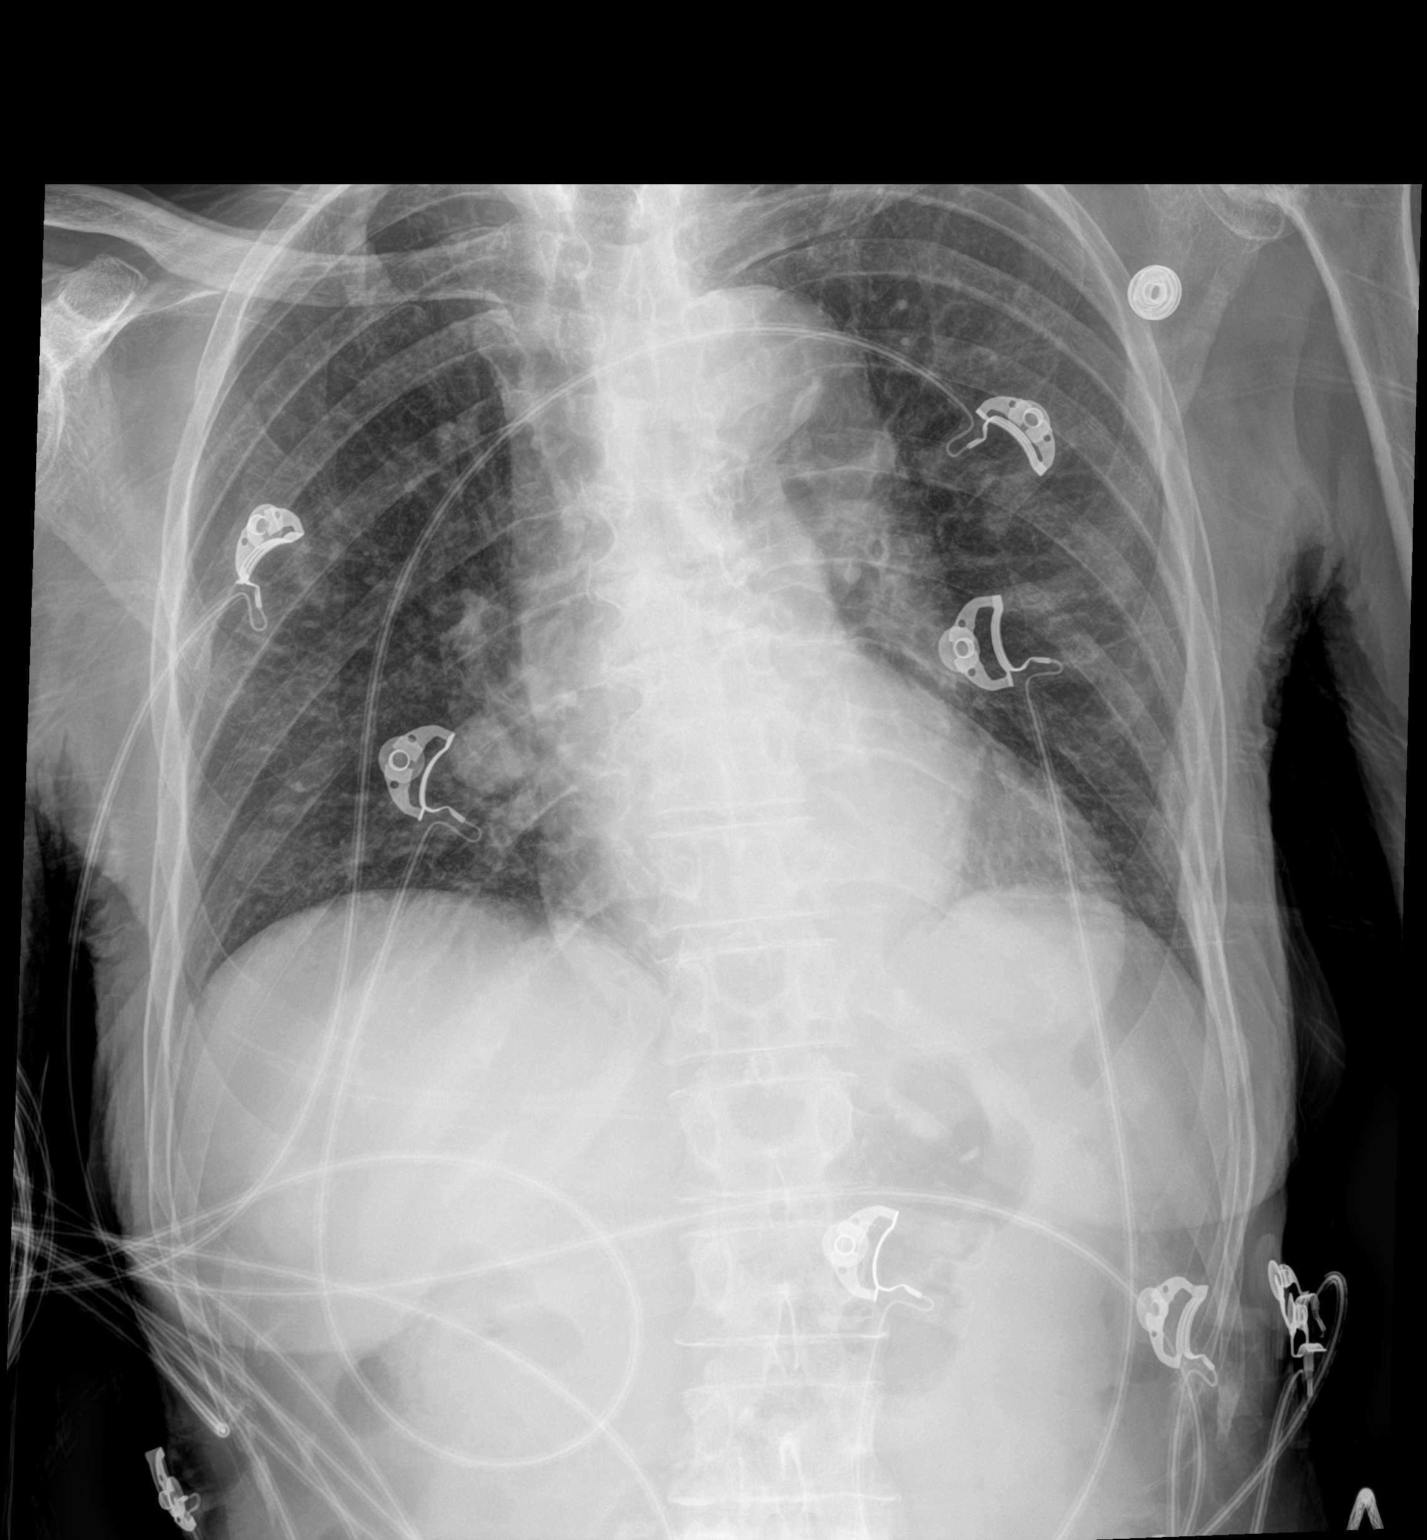

[1 of 1 positions shown; findings below may reference images not displayed]

FINDINGS: Heart is borderline in size. No confluent airspace opacities,
effusions or edema. No acute bony abnormality.
IMPRESSION: No active disease.

## 2020-04-16 IMAGING — CT CT ABDOMEN AND PELVIS WITHOUT CONTRAST
2 of 4 series · 15 of 46 positions shown, 17 images · non-contrast
Comparison: 03/08/2019 renal ultrasound. Stone study of 11/18/2008
reviewed.

CLINICAL DATA: Unintentional weight loss.  Diffuse abdominal pain.

EXAM:
CT ABDOMEN AND PELVIS WITHOUT CONTRAST
TECHNIQUE: Multidetector CT imaging of the abdomen and pelvis was performed
following the standard protocol without IV contrast.

[Series 3: a/p w/o 5mm · axial · non-contrast · 0.69mm/px · z∈[+860,+1204]mm · 12 of 82 slices shown, 14 images]
[im 7/82  soft-tissue]
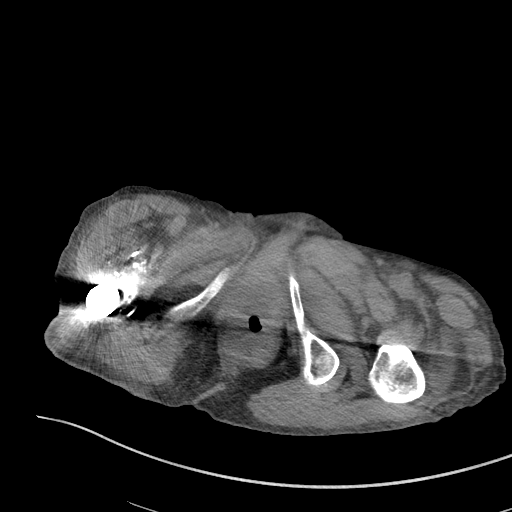
[im 7/82  bone]
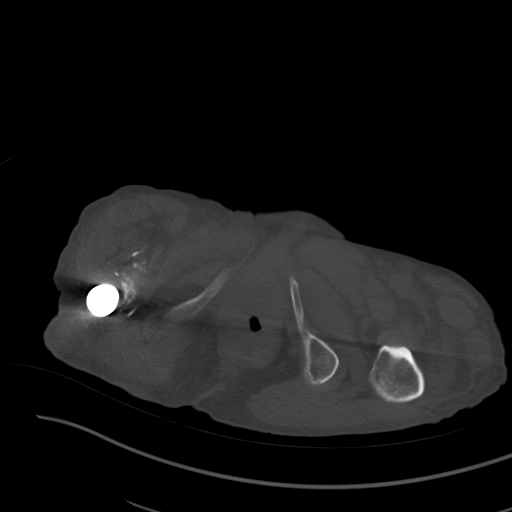
[im 13/82  soft-tissue]
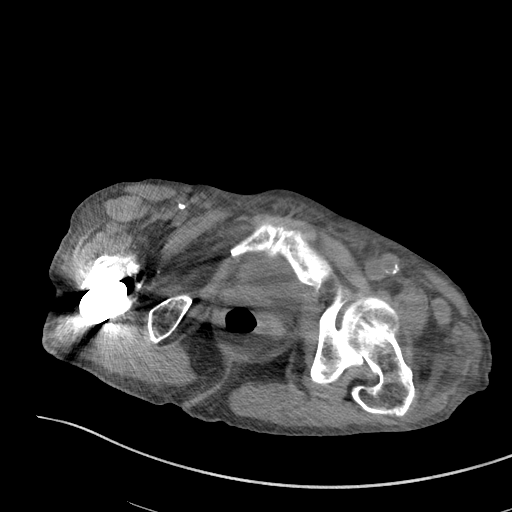
[im 19/82  soft-tissue]
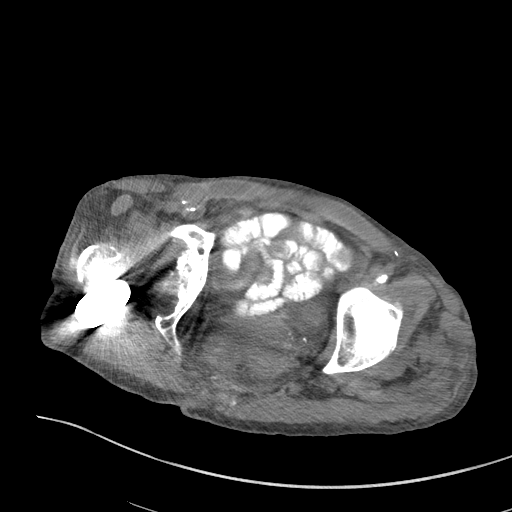
[im 25/82  soft-tissue]
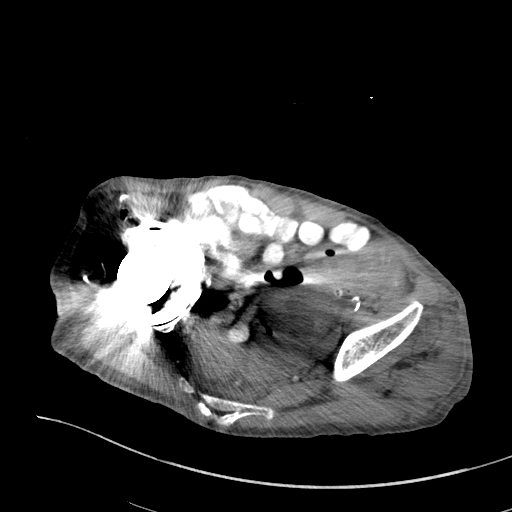
[im 31/82  soft-tissue]
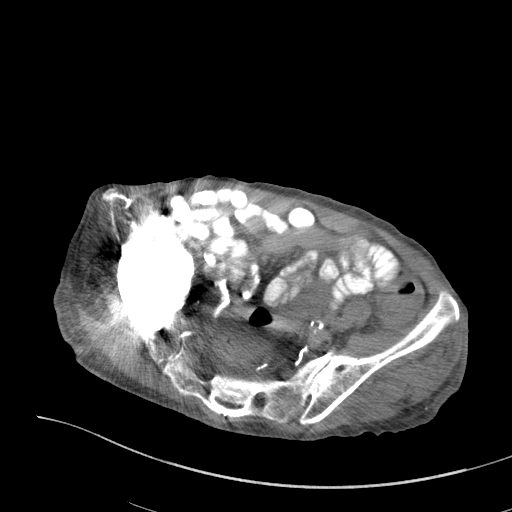
[im 37/82  soft-tissue]
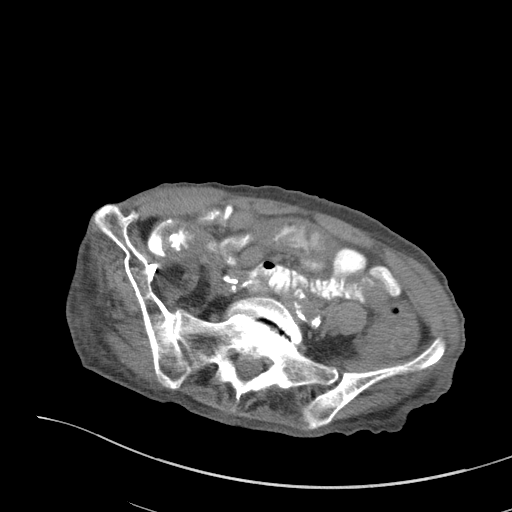
[im 46/82  soft-tissue]
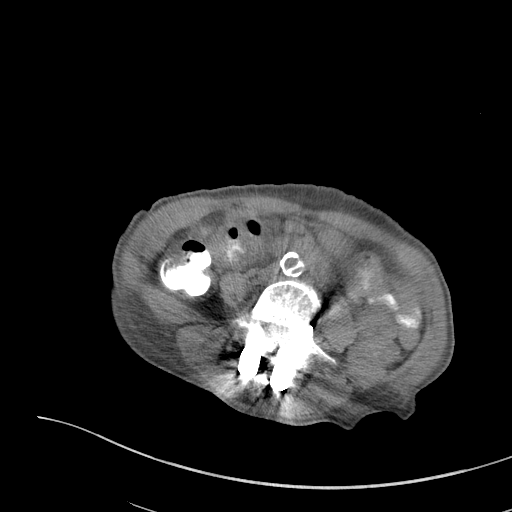
[im 52/82  soft-tissue]
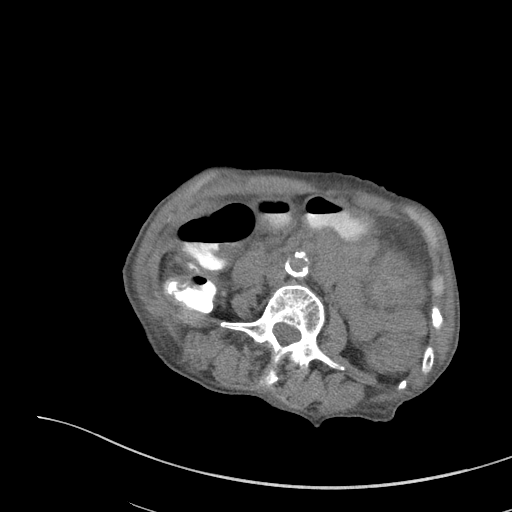
[im 58/82  soft-tissue]
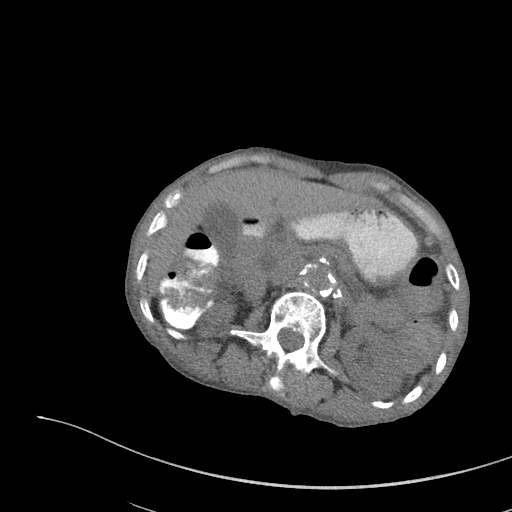
[im 58/82  bone]
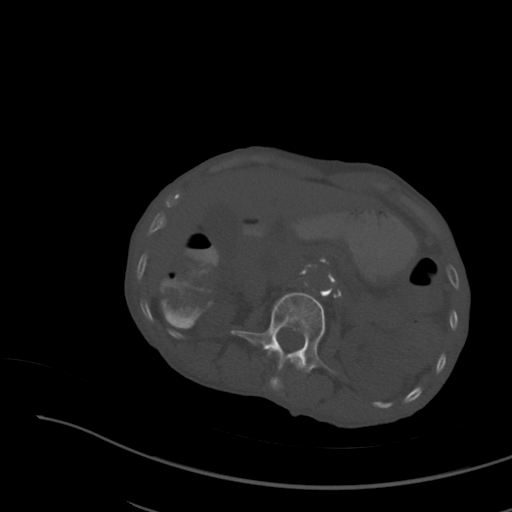
[im 64/82  soft-tissue]
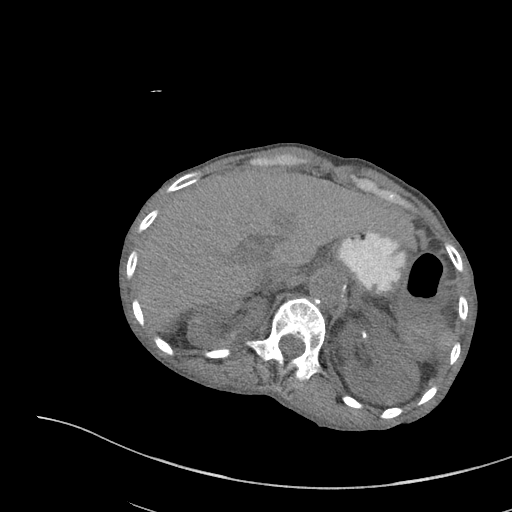
[im 70/82  soft-tissue]
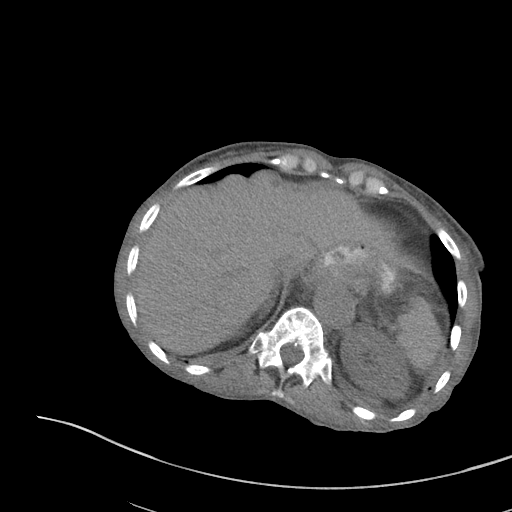
[im 76/82  soft-tissue]
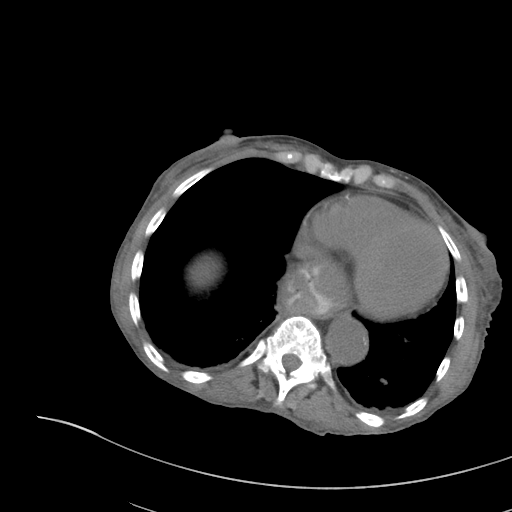

[Series 6: a/p w/o cor · coronal · non-contrast · 0.63mm/px · 3 of 103 slices shown]
[im 35/103  soft-tissue]
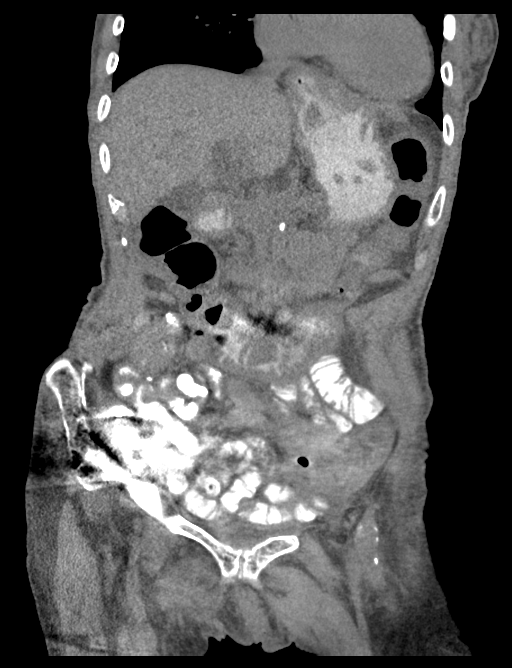
[im 46/103  soft-tissue]
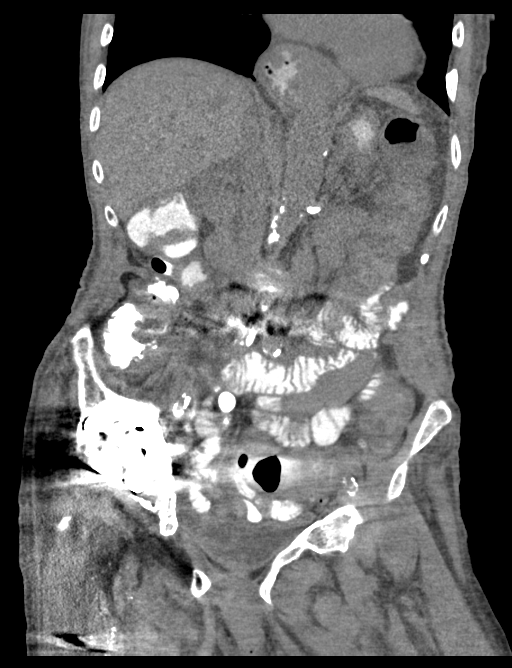
[im 57/103  soft-tissue]
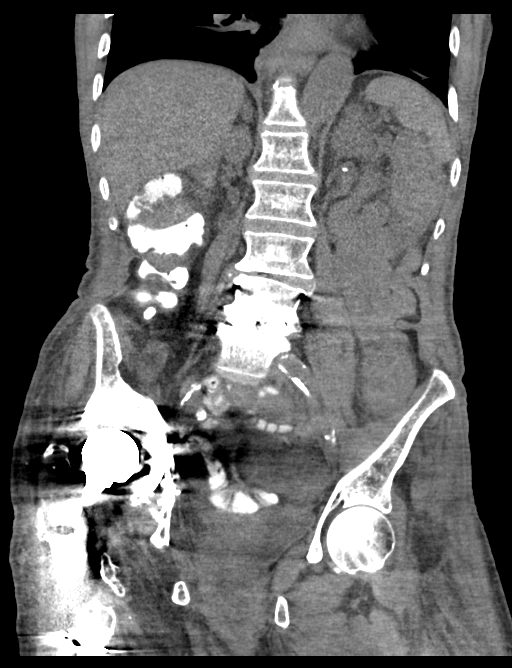

[15 of 46 positions shown; findings below may reference images not displayed]

FINDINGS: Lower chest: Mild bibasilar volume loss. Mild cardiomegaly with
right coronary artery atherosclerosis. Trace bilateral pleural fluid
or thickening. Moderate hiatal hernia.

Hepatobiliary: Degraded exam secondary to lack of IV contrast,
paucity of abdominopelvic fat, lumbar spine and right hip hardware
artifact. Grossly normal noncontrast appearance of the liver and
gallbladder. Common duct is mildly prominent for age, including at
10 mm on image [DATE].

Pancreas: Pancreas is not well evaluated. Diffuse pancreatic
atrophy, without definite duct dilatation or acute inflammation.

Spleen: Normal in size, without focal abnormality.

Adrenals/Urinary Tract: Normal adrenal glands. Moderate right renal
atrophy. Left renal vascular calcifications. No hydronephrosis. No
bladder calculi.

Stomach/Bowel: The herniated stomach appears thick walled including
on image [DATE]. This area is underdistended and redundant.

The left side of the colon is underdistended and not contrast
opacified. Suspect concurrent moderate wall thickening, including on
image 48/3. Diverticular identified throughout the colon. Grossly
normal terminal ileum. The appendix may be identified including on
image 39/3. Small bowel loops are grossly unremarkable, but not well
evaluated.

Vascular/Lymphatic: Advanced aortic and branch vessel
atherosclerosis. Status post fem/fem bypass. Limited evaluation for
abdominopelvic adenopathy.

Reproductive: Calcified uterine fibroids. Adnexa not well evaluated.

Other: No gross free pelvic fluid or abdominal ascites.

Musculoskeletal: Right hip arthroplasty. L4-5 trans pedicle screw
fixation. Advanced lumbosacral spondylosis with mild convex left
lumbar spine curvature.
IMPRESSION: 1. Moderate to marked degradation, secondary to multiple factors
detailed above.
2. Moderate hiatal hernia with apparent wall thickening within the
herniated stomach. This could at least partially be due to
underdistention. Given the clinical history of weight loss,
endoscopy should be considered to exclude gastric neoplasm.
3. Suspicion of left-sided colitis, suboptimally evaluated. Consider
infection or ischemia.
4. Uterine fibroids.
5. Coronary artery atherosclerosis. Aortic Atherosclerosis
([id number]).
6. Mild common duct dilatation for age. Correlate with bilirubin
level. If elevated, consider MRCP or ERCP.

## 2020-04-18 ENCOUNTER — Other Ambulatory Visit: Payer: Self-pay | Admitting: Family

## 2020-04-22 ENCOUNTER — Inpatient Hospital Stay: Admission: RE | Admit: 2020-04-22 | Payer: Medicare HMO | Source: Ambulatory Visit

## 2020-04-29 IMAGING — CR CHEST - 2 VIEW
2 series · 2 of 2 positions shown · non-contrast
Comparison: 03/08/2019

CLINICAL DATA: Shortness of breath with anemia

EXAM:
CHEST - 2 VIEW

[w chest pa]
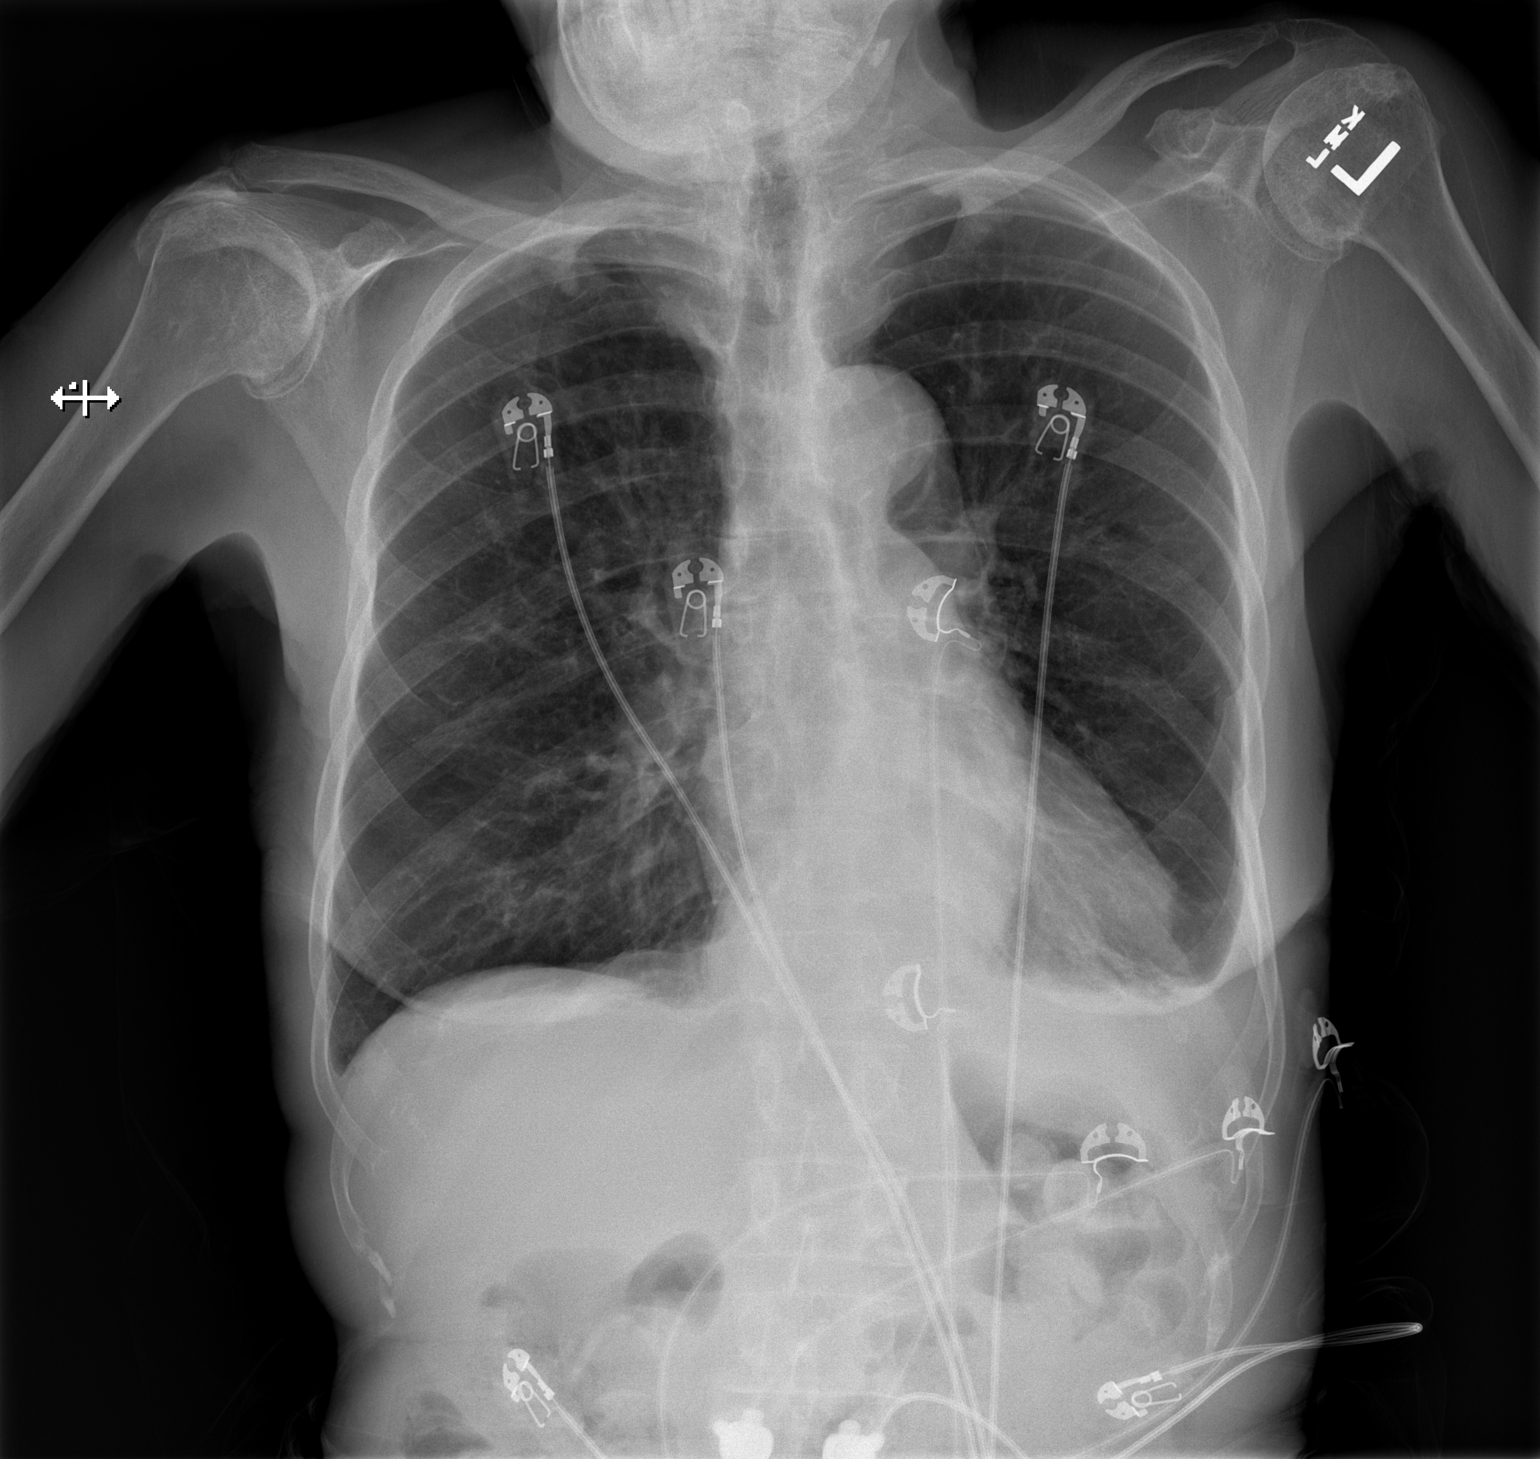

[w chest lat]
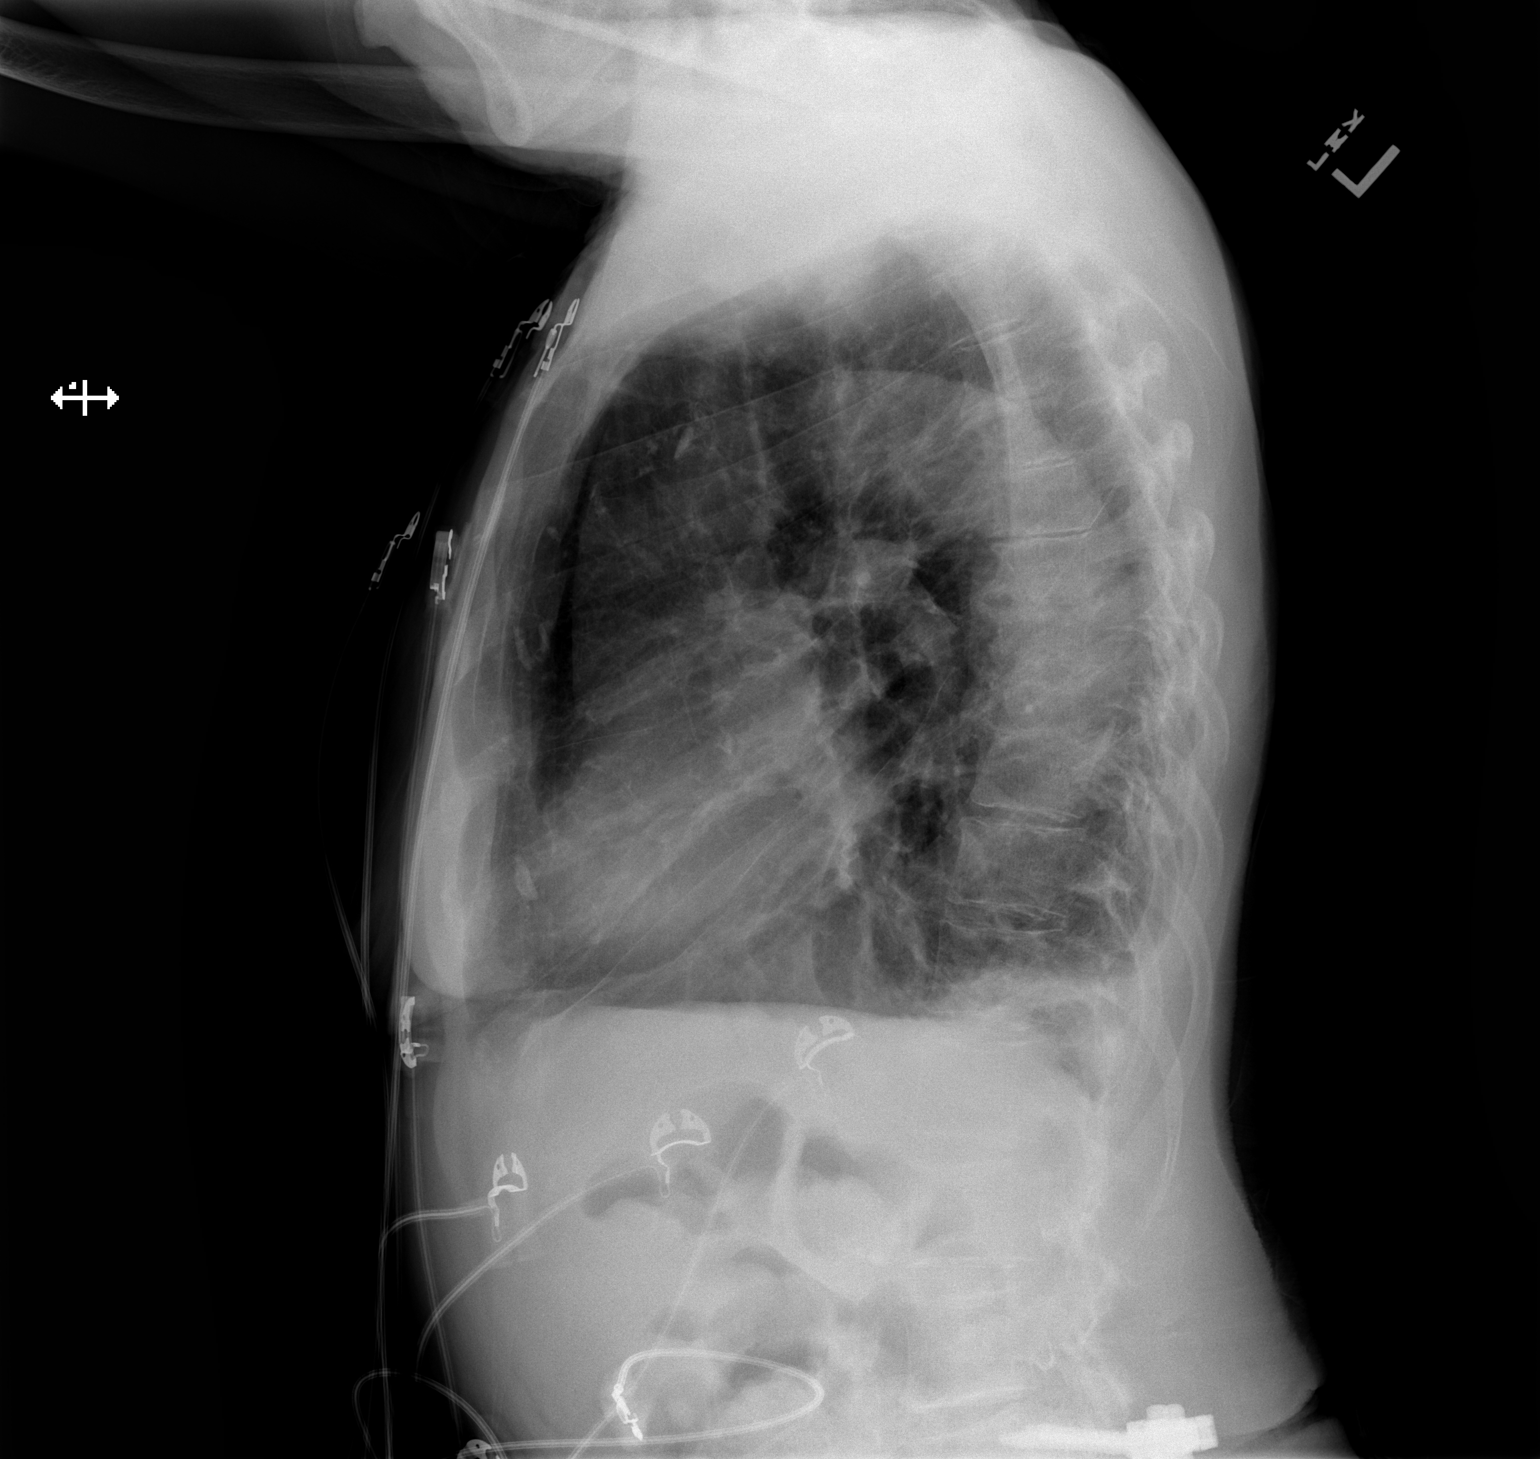

[2 of 2 positions shown; findings below may reference images not displayed]

FINDINGS: The heart size is stable. Aortic calcifications are noted. There is
some pleuroparenchymal scarring at the lung apices. A background of
emphysematous changes is suspected. There is some blunting of the
left costophrenic angle. There are old healed left-sided rib
fractures. There is no evidence of acute osseous abnormality. There
are advanced degenerative changes of the midthoracic spine.
IMPRESSION: 1. Stable cardiac silhouette.
2. Interval development of a small left-sided pleural effusion.
There is a new opacity at the left costophrenic angle which may
represent atelectasis or infiltrate.
3. The lungs remain hyperexpanded which can be seen in patients with
COPD.

## 2020-04-30 ENCOUNTER — Other Ambulatory Visit (HOSPITAL_COMMUNITY): Payer: Medicare HMO

## 2020-05-01 ENCOUNTER — Encounter (HOSPITAL_COMMUNITY): Payer: Self-pay | Admitting: Cardiovascular Disease

## 2020-05-02 ENCOUNTER — Ambulatory Visit
Admission: RE | Admit: 2020-05-02 | Discharge: 2020-05-02 | Disposition: A | Discharge status: Home / Self Care | Payer: Medicare HMO | Source: Ambulatory Visit | Attending: Family | Admitting: Family

## 2020-05-02 ENCOUNTER — Other Ambulatory Visit: Payer: Self-pay

## 2020-05-02 DIAGNOSIS — R109 Unspecified abdominal pain: Secondary | ICD-10-CM

## 2020-05-02 DIAGNOSIS — R1900 Intra-abdominal and pelvic swelling, mass and lump, unspecified site: Secondary | ICD-10-CM

## 2020-05-12 ENCOUNTER — Telehealth (HOSPITAL_COMMUNITY): Payer: Self-pay | Admitting: Cardiovascular Disease

## 2020-05-12 NOTE — Telephone Encounter (Signed)
Just an FYI. We have made several attempts to contact this patient including sending a letter to schedule or reschedule their echocardiogram. We will be removing the patient from the echo [place].    05/01/2020 MAILED LETTER /[facility]  04/30/20 Pt No Showed echo /[facility]    Thank you

## 2020-05-29 ENCOUNTER — Other Ambulatory Visit: Payer: Self-pay

## 2020-05-29 ENCOUNTER — Ambulatory Visit (HOSPITAL_COMMUNITY): Payer: Medicare HMO | Attending: Cardiology

## 2020-05-29 DIAGNOSIS — I6523 Occlusion and stenosis of bilateral carotid arteries: Secondary | ICD-10-CM

## 2020-05-29 DIAGNOSIS — I214 Non-ST elevation (NSTEMI) myocardial infarction: Secondary | ICD-10-CM | POA: Diagnosis not present

## 2020-05-29 LAB — ECHOCARDIOGRAM COMPLETE
Area-P 1/2: 5.02 cm2
S' Lateral: 2.3 cm

## 2020-06-02 ENCOUNTER — Other Ambulatory Visit: Payer: Self-pay

## 2020-06-02 ENCOUNTER — Inpatient Hospital Stay (HOSPITAL_COMMUNITY)
Admission: EM | Admit: 2020-06-02 | Discharge: 2020-06-07 | DRG: 177 | Disposition: A | Discharge status: Home Health | Payer: Medicare HMO | Attending: Internal Medicine | Admitting: Internal Medicine

## 2020-06-02 ENCOUNTER — Emergency Department (HOSPITAL_COMMUNITY): Payer: Medicare HMO

## 2020-06-02 DIAGNOSIS — J9600 Acute respiratory failure, unspecified whether with hypoxia or hypercapnia: Secondary | ICD-10-CM

## 2020-06-02 DIAGNOSIS — E872 Acidosis: Secondary | ICD-10-CM | POA: Diagnosis present

## 2020-06-02 DIAGNOSIS — J441 Chronic obstructive pulmonary disease with (acute) exacerbation: Secondary | ICD-10-CM | POA: Diagnosis present

## 2020-06-02 DIAGNOSIS — N390 Urinary tract infection, site not specified: Secondary | ICD-10-CM | POA: Diagnosis present

## 2020-06-02 DIAGNOSIS — K5732 Diverticulitis of large intestine without perforation or abscess without bleeding: Secondary | ICD-10-CM | POA: Diagnosis present

## 2020-06-02 DIAGNOSIS — I252 Old myocardial infarction: Secondary | ICD-10-CM

## 2020-06-02 DIAGNOSIS — I724 Aneurysm of artery of lower extremity: Secondary | ICD-10-CM | POA: Diagnosis present

## 2020-06-02 DIAGNOSIS — D631 Anemia in chronic kidney disease: Secondary | ICD-10-CM | POA: Diagnosis present

## 2020-06-02 DIAGNOSIS — N184 Chronic kidney disease, stage 4 (severe): Secondary | ICD-10-CM | POA: Diagnosis present

## 2020-06-02 DIAGNOSIS — R627 Adult failure to thrive: Secondary | ICD-10-CM | POA: Diagnosis present

## 2020-06-02 DIAGNOSIS — Z7902 Long term (current) use of antithrombotics/antiplatelets: Secondary | ICD-10-CM

## 2020-06-02 DIAGNOSIS — J9621 Acute and chronic respiratory failure with hypoxia: Secondary | ICD-10-CM | POA: Diagnosis present

## 2020-06-02 DIAGNOSIS — J1282 Pneumonia due to coronavirus disease 2019: Secondary | ICD-10-CM | POA: Diagnosis present

## 2020-06-02 DIAGNOSIS — B9620 Unspecified Escherichia coli [E. coli] as the cause of diseases classified elsewhere: Secondary | ICD-10-CM | POA: Diagnosis present

## 2020-06-02 DIAGNOSIS — N1832 Chronic kidney disease, stage 3b: Secondary | ICD-10-CM | POA: Diagnosis not present

## 2020-06-02 DIAGNOSIS — Z96641 Presence of right artificial hip joint: Secondary | ICD-10-CM | POA: Diagnosis present

## 2020-06-02 DIAGNOSIS — Z515 Encounter for palliative care: Secondary | ICD-10-CM | POA: Diagnosis present

## 2020-06-02 DIAGNOSIS — Z681 Body mass index (BMI) 19 or less, adult: Secondary | ICD-10-CM

## 2020-06-02 DIAGNOSIS — F419 Anxiety disorder, unspecified: Secondary | ICD-10-CM | POA: Diagnosis present

## 2020-06-02 DIAGNOSIS — Z8249 Family history of ischemic heart disease and other diseases of the circulatory system: Secondary | ICD-10-CM

## 2020-06-02 DIAGNOSIS — J440 Chronic obstructive pulmonary disease with acute lower respiratory infection: Secondary | ICD-10-CM | POA: Diagnosis present

## 2020-06-02 DIAGNOSIS — E785 Hyperlipidemia, unspecified: Secondary | ICD-10-CM | POA: Diagnosis present

## 2020-06-02 DIAGNOSIS — N179 Acute kidney failure, unspecified: Secondary | ICD-10-CM | POA: Diagnosis present

## 2020-06-02 DIAGNOSIS — Z83438 Family history of other disorder of lipoprotein metabolism and other lipidemia: Secondary | ICD-10-CM

## 2020-06-02 DIAGNOSIS — I739 Peripheral vascular disease, unspecified: Secondary | ICD-10-CM | POA: Diagnosis present

## 2020-06-02 DIAGNOSIS — R7989 Other specified abnormal findings of blood chemistry: Secondary | ICD-10-CM

## 2020-06-02 DIAGNOSIS — R64 Cachexia: Secondary | ICD-10-CM | POA: Diagnosis present

## 2020-06-02 DIAGNOSIS — Z9981 Dependence on supplemental oxygen: Secondary | ICD-10-CM

## 2020-06-02 DIAGNOSIS — F17210 Nicotine dependence, cigarettes, uncomplicated: Secondary | ICD-10-CM | POA: Diagnosis present

## 2020-06-02 DIAGNOSIS — N3000 Acute cystitis without hematuria: Secondary | ICD-10-CM | POA: Diagnosis not present

## 2020-06-02 DIAGNOSIS — J9601 Acute respiratory failure with hypoxia: Secondary | ICD-10-CM | POA: Diagnosis not present

## 2020-06-02 DIAGNOSIS — E860 Dehydration: Secondary | ICD-10-CM | POA: Diagnosis present

## 2020-06-02 DIAGNOSIS — G629 Polyneuropathy, unspecified: Secondary | ICD-10-CM | POA: Diagnosis present

## 2020-06-02 DIAGNOSIS — E43 Unspecified severe protein-calorie malnutrition: Secondary | ICD-10-CM | POA: Diagnosis present

## 2020-06-02 DIAGNOSIS — E440 Moderate protein-calorie malnutrition: Secondary | ICD-10-CM | POA: Diagnosis present

## 2020-06-02 DIAGNOSIS — U071 COVID-19: Secondary | ICD-10-CM | POA: Diagnosis present

## 2020-06-02 DIAGNOSIS — Z809 Family history of malignant neoplasm, unspecified: Secondary | ICD-10-CM

## 2020-06-02 DIAGNOSIS — Z66 Do not resuscitate: Secondary | ICD-10-CM | POA: Diagnosis present

## 2020-06-02 DIAGNOSIS — F0390 Unspecified dementia without behavioral disturbance: Secondary | ICD-10-CM | POA: Diagnosis present

## 2020-06-02 DIAGNOSIS — G894 Chronic pain syndrome: Secondary | ICD-10-CM | POA: Diagnosis not present

## 2020-06-02 DIAGNOSIS — R0602 Shortness of breath: Secondary | ICD-10-CM | POA: Diagnosis present

## 2020-06-02 DIAGNOSIS — I129 Hypertensive chronic kidney disease with stage 1 through stage 4 chronic kidney disease, or unspecified chronic kidney disease: Secondary | ICD-10-CM | POA: Diagnosis present

## 2020-06-02 DIAGNOSIS — Z7982 Long term (current) use of aspirin: Secondary | ICD-10-CM

## 2020-06-02 DIAGNOSIS — R0902 Hypoxemia: Secondary | ICD-10-CM

## 2020-06-02 DIAGNOSIS — Z79899 Other long term (current) drug therapy: Secondary | ICD-10-CM

## 2020-06-02 DIAGNOSIS — Z1611 Resistance to penicillins: Secondary | ICD-10-CM | POA: Diagnosis present

## 2020-06-02 DIAGNOSIS — K5792 Diverticulitis of intestine, part unspecified, without perforation or abscess without bleeding: Secondary | ICD-10-CM | POA: Diagnosis present

## 2020-06-02 DIAGNOSIS — K458 Other specified abdominal hernia without obstruction or gangrene: Secondary | ICD-10-CM | POA: Diagnosis present

## 2020-06-02 LAB — COMPREHENSIVE METABOLIC PANEL
ALT: 12 U/L (ref 0–44)
AST: 18 U/L (ref 15–41)
Albumin: 3.2 g/dL — ABNORMAL LOW (ref 3.5–5.0)
Alkaline Phosphatase: 107 U/L (ref 38–126)
Anion gap: 19 — ABNORMAL HIGH (ref 5–15)
BUN: 78 mg/dL — ABNORMAL HIGH (ref 8–23)
CO2: 7 mmol/L — ABNORMAL LOW (ref 22–32)
Calcium: 9.3 mg/dL (ref 8.9–10.3)
Chloride: 117 mmol/L — ABNORMAL HIGH (ref 98–111)
Creatinine, Ser: 7.43 mg/dL — ABNORMAL HIGH (ref 0.44–1.00)
GFR calc Af Amer: 6 mL/min — ABNORMAL LOW (ref >60)
GFR calc non Af Amer: 5 mL/min — ABNORMAL LOW (ref >60)
Glucose, Bld: 93 mg/dL (ref 70–99)
Potassium: 4.8 mmol/L (ref 3.5–5.1)
Sodium: 143 mmol/L (ref 135–145)
Total Bilirubin: 0.6 mg/dL (ref 0.3–1.2)
Total Protein: 7.2 g/dL (ref 6.5–8.1)

## 2020-06-02 LAB — CBC
HCT: 27.3 % — ABNORMAL LOW (ref 36.0–46.0)
Hemoglobin: 8.9 g/dL — ABNORMAL LOW (ref 12.0–15.0)
MCH: 29 pg (ref 26.0–34.0)
MCHC: 32.6 g/dL (ref 30.0–36.0)
MCV: 88.9 fL (ref 80.0–100.0)
Platelets: 382 10*3/uL (ref 150–400)
RBC: 3.07 MIL/uL — ABNORMAL LOW (ref 3.87–5.11)
RDW: 16.8 % — ABNORMAL HIGH (ref 11.5–15.5)
WBC: 14.9 10*3/uL — ABNORMAL HIGH (ref 4.0–10.5)
nRBC: 0.6 % — ABNORMAL HIGH (ref 0.0–0.2)

## 2020-06-02 LAB — URINALYSIS, ROUTINE W REFLEX MICROSCOPIC
Bilirubin Urine: NEGATIVE
Glucose, UA: NEGATIVE mg/dL
Ketones, ur: NEGATIVE mg/dL
Nitrite: NEGATIVE
Protein, ur: 100 mg/dL — AB
Specific Gravity, Urine: 1.013 (ref 1.005–1.030)
WBC, UA: >50 WBC/hpf — ABNORMAL HIGH (ref 0–5)
pH: 5 (ref 5.0–8.0)

## 2020-06-02 LAB — CBC WITH DIFFERENTIAL/PLATELET
Abs Immature Granulocytes: 0.18 10*3/uL — ABNORMAL HIGH (ref 0.00–0.07)
Basophils Absolute: 0.1 10*3/uL (ref 0.0–0.1)
Basophils Relative: 1 %
Eosinophils Absolute: 0.4 10*3/uL (ref 0.0–0.5)
Eosinophils Relative: 3 %
HCT: 29.8 % — ABNORMAL LOW (ref 36.0–46.0)
Hemoglobin: 9.7 g/dL — ABNORMAL LOW (ref 12.0–15.0)
Immature Granulocytes: 1 %
Lymphocytes Relative: 8 %
Lymphs Abs: 1.2 10*3/uL (ref 0.7–4.0)
MCH: 29 pg (ref 26.0–34.0)
MCHC: 32.6 g/dL (ref 30.0–36.0)
MCV: 89 fL (ref 80.0–100.0)
Monocytes Absolute: 1 10*3/uL (ref 0.1–1.0)
Monocytes Relative: 7 %
Neutro Abs: 12.4 10*3/uL — ABNORMAL HIGH (ref 1.7–7.7)
Neutrophils Relative %: 80 %
Platelets: 404 10*3/uL — ABNORMAL HIGH (ref 150–400)
RBC: 3.35 MIL/uL — ABNORMAL LOW (ref 3.87–5.11)
RDW: 16.8 % — ABNORMAL HIGH (ref 11.5–15.5)
WBC: 15.3 10*3/uL — ABNORMAL HIGH (ref 4.0–10.5)
nRBC: 0.4 % — ABNORMAL HIGH (ref 0.0–0.2)

## 2020-06-02 LAB — CREATININE, SERUM
Creatinine, Ser: 6.99 mg/dL — ABNORMAL HIGH (ref 0.44–1.00)
GFR calc Af Amer: 6 mL/min — ABNORMAL LOW (ref >60)
GFR calc non Af Amer: 5 mL/min — ABNORMAL LOW (ref >60)

## 2020-06-02 LAB — LIPASE, BLOOD: Lipase: 118 U/L — ABNORMAL HIGH (ref 11–51)

## 2020-06-02 LAB — SARS CORONAVIRUS 2 BY RT PCR (HOSPITAL ORDER, PERFORMED IN ~~LOC~~ HOSPITAL LAB): SARS Coronavirus 2: POSITIVE — AB

## 2020-06-02 LAB — TROPONIN I (HIGH SENSITIVITY): Troponin I (High Sensitivity): 41 ng/L — ABNORMAL HIGH (ref <18)

## 2020-06-02 LAB — TSH: TSH: 1.872 u[IU]/mL (ref 0.350–4.500)

## 2020-06-02 MED ORDER — ONDANSETRON HCL 4 MG PO TABS: 4.0000 mg | ORAL_TABLET | Freq: Four times a day (QID) | ORAL | Status: DC | PRN

## 2020-06-02 MED ORDER — SODIUM CHLORIDE 0.9 % IV SOLN
500.0000 mg | INTRAVENOUS | Status: DC
  Administered 2020-06-02 – 2020-06-03 (×2): 500 mg via INTRAVENOUS
  Filled 2020-06-02 (×3): qty 500

## 2020-06-02 MED ORDER — CARVEDILOL 12.5 MG PO TABS
12.5000 mg | ORAL_TABLET | Freq: Two times a day (BID) | ORAL | Status: DC
  Administered 2020-06-02 – 2020-06-07 (×11): 12.5 mg via ORAL
  Filled 2020-06-02 (×10): qty 1

## 2020-06-02 MED ORDER — ADULT MULTIVITAMIN W/MINERALS CH
1.0000 | ORAL_TABLET | Freq: Every day | ORAL | Status: DC
  Administered 2020-06-03 – 2020-06-07 (×5): 1 via ORAL
  Filled 2020-06-02 (×5): qty 1

## 2020-06-02 MED ORDER — ACETAMINOPHEN 325 MG PO TABS
650.0000 mg | ORAL_TABLET | Freq: Four times a day (QID) | ORAL | Status: DC | PRN
  Administered 2020-06-03: 650 mg via ORAL
  Filled 2020-06-02: qty 2

## 2020-06-02 MED ORDER — NITROGLYCERIN 0.4 MG SL SUBL
0.4000 mg | SUBLINGUAL_TABLET | SUBLINGUAL | Status: DC | PRN
  Administered 2020-06-05 (×2): 0.4 mg via SUBLINGUAL
  Filled 2020-06-02 (×2): qty 1

## 2020-06-02 MED ORDER — ONDANSETRON HCL 4 MG/2ML IJ SOLN: 4.0000 mg | Freq: Four times a day (QID) | INTRAMUSCULAR | Status: DC | PRN

## 2020-06-02 MED ORDER — HEPARIN SODIUM (PORCINE) 5000 UNIT/ML IJ SOLN
5000.0000 [IU] | Freq: Three times a day (TID) | INTRAMUSCULAR | Status: DC
  Administered 2020-06-02 – 2020-06-03 (×2): 5000 [IU] via SUBCUTANEOUS
  Filled 2020-06-02 (×2): qty 1

## 2020-06-02 MED ORDER — SODIUM CHLORIDE 0.9 % IV BOLUS
1000.0000 mL | Freq: Once | INTRAVENOUS | Status: AC
  Administered 2020-06-02: 1000 mL via INTRAVENOUS

## 2020-06-02 MED ORDER — GUAIFENESIN-DM 100-10 MG/5ML PO SYRP: 10.0000 mL | ORAL_SOLUTION | ORAL | Status: DC | PRN

## 2020-06-02 MED ORDER — FERROUS SULFATE 325 (65 FE) MG PO TABS
325.0000 mg | ORAL_TABLET | Freq: Every day | ORAL | Status: DC
  Administered 2020-06-03 – 2020-06-07 (×5): 325 mg via ORAL
  Filled 2020-06-02 (×5): qty 1

## 2020-06-02 MED ORDER — ENSURE ENLIVE PO LIQD
237.0000 mL | Freq: Two times a day (BID) | ORAL | Status: DC
  Administered 2020-06-04 – 2020-06-07 (×6): 237 mL via ORAL
  Filled 2020-06-02: qty 237

## 2020-06-02 MED ORDER — DEXAMETHASONE SODIUM PHOSPHATE 10 MG/ML IJ SOLN
6.0000 mg | Freq: Once | INTRAMUSCULAR | Status: AC
  Administered 2020-06-02: 6 mg via INTRAVENOUS
  Filled 2020-06-02: qty 1

## 2020-06-02 MED ORDER — BUSPIRONE HCL 5 MG PO TABS: 15.0000 mg | ORAL_TABLET | Freq: Three times a day (TID) | ORAL | Status: DC | PRN

## 2020-06-02 MED ORDER — SODIUM CHLORIDE 0.9 % IV SOLN
200.0000 mg | Freq: Once | INTRAVENOUS | Status: AC
  Administered 2020-06-02: 200 mg via INTRAVENOUS
  Filled 2020-06-02: qty 40

## 2020-06-02 MED ORDER — AMLODIPINE BESYLATE 5 MG PO TABS
7.5000 mg | ORAL_TABLET | Freq: Every day | ORAL | Status: DC
  Administered 2020-06-03 – 2020-06-07 (×6): 7.5 mg via ORAL
  Filled 2020-06-02: qty 1
  Filled 2020-06-02 (×2): qty 2
  Filled 2020-06-02 (×3): qty 1

## 2020-06-02 MED ORDER — GABAPENTIN 100 MG PO CAPS
100.0000 mg | ORAL_CAPSULE | Freq: Three times a day (TID) | ORAL | Status: DC
  Administered 2020-06-02 – 2020-06-07 (×14): 100 mg via ORAL
  Filled 2020-06-02 (×14): qty 1

## 2020-06-02 MED ORDER — SODIUM BICARBONATE 650 MG PO TABS
1300.0000 mg | ORAL_TABLET | Freq: Two times a day (BID) | ORAL | Status: DC
  Administered 2020-06-03 – 2020-06-06 (×8): 1300 mg via ORAL
  Filled 2020-06-02 (×9): qty 2

## 2020-06-02 MED ORDER — DEXTROSE IN LACTATED RINGERS 5 % IV SOLN: INTRAVENOUS | Status: DC

## 2020-06-02 MED ORDER — SODIUM CHLORIDE 0.9 % IV SOLN
100.0000 mg | INTRAVENOUS | Status: AC
  Administered 2020-06-03 – 2020-06-06 (×4): 100 mg via INTRAVENOUS
  Filled 2020-06-02 (×4): qty 20

## 2020-06-02 MED ORDER — DEXAMETHASONE SODIUM PHOSPHATE 10 MG/ML IJ SOLN: 6.0000 mg | INTRAMUSCULAR | Status: DC

## 2020-06-02 MED ORDER — MORPHINE SULFATE (PF) 2 MG/ML IV SOLN
1.0000 mg | Freq: Once | INTRAVENOUS | Status: AC
  Administered 2020-06-02: 1 mg via INTRAVENOUS
  Filled 2020-06-02: qty 1

## 2020-06-02 MED ORDER — DONEPEZIL HCL 5 MG PO TABS
5.0000 mg | ORAL_TABLET | Freq: Every day | ORAL | Status: DC
  Administered 2020-06-03 – 2020-06-07 (×5): 5 mg via ORAL
  Filled 2020-06-02 (×5): qty 1

## 2020-06-02 MED ORDER — ASPIRIN EC 81 MG PO TBEC
81.0000 mg | DELAYED_RELEASE_TABLET | Freq: Every day | ORAL | Status: DC
  Administered 2020-06-03 – 2020-06-07 (×5): 81 mg via ORAL
  Filled 2020-06-02 (×5): qty 1

## 2020-06-02 MED ORDER — DULOXETINE HCL 30 MG PO CPEP
30.0000 mg | ORAL_CAPSULE | Freq: Every day | ORAL | Status: DC
  Administered 2020-06-03 – 2020-06-07 (×5): 30 mg via ORAL
  Filled 2020-06-02 (×5): qty 1

## 2020-06-02 MED ORDER — SODIUM CHLORIDE 0.9 % IV SOLN
1.0000 g | INTRAVENOUS | Status: DC
  Administered 2020-06-02 – 2020-06-03 (×2): 1 g via INTRAVENOUS
  Filled 2020-06-02 (×2): qty 10

## 2020-06-02 MED ORDER — DICLOFENAC SODIUM 1 % EX GEL
2.0000 g | Freq: Three times a day (TID) | CUTANEOUS | Status: DC
  Administered 2020-06-03 (×4): 2 g via TOPICAL
  Filled 2020-06-02: qty 100

## 2020-06-02 MED ORDER — ALBUTEROL SULFATE HFA 108 (90 BASE) MCG/ACT IN AERS
2.0000 | INHALATION_SPRAY | Freq: Four times a day (QID) | RESPIRATORY_TRACT | Status: DC | PRN
  Administered 2020-06-06: 2 via RESPIRATORY_TRACT
  Filled 2020-06-02: qty 6.7

## 2020-06-02 MED ORDER — ONDANSETRON HCL 4 MG/2ML IJ SOLN
4.0000 mg | Freq: Once | INTRAMUSCULAR | Status: AC
  Administered 2020-06-02: 4 mg via INTRAVENOUS
  Filled 2020-06-02: qty 2

## 2020-06-02 MED ORDER — HYDROCOD POLST-CPM POLST ER 10-8 MG/5ML PO SUER: 5.0000 mL | Freq: Two times a day (BID) | ORAL | Status: DC | PRN

## 2020-06-02 MED ORDER — PANTOPRAZOLE SODIUM 40 MG PO TBEC
40.0000 mg | DELAYED_RELEASE_TABLET | Freq: Every day | ORAL | Status: DC
  Administered 2020-06-03 – 2020-06-07 (×5): 40 mg via ORAL
  Filled 2020-06-02 (×5): qty 1

## 2020-06-02 MED ORDER — SODIUM CHLORIDE 0.9 % IV SOLN: 200.0000 mg | Freq: Once | INTRAVENOUS | Status: DC

## 2020-06-02 MED ORDER — ROSUVASTATIN CALCIUM 20 MG PO TABS
40.0000 mg | ORAL_TABLET | Freq: Every day | ORAL | Status: DC
  Administered 2020-06-03 – 2020-06-07 (×5): 40 mg via ORAL
  Filled 2020-06-02 (×5): qty 2

## 2020-06-02 MED ORDER — IPRATROPIUM-ALBUTEROL 20-100 MCG/ACT IN AERS
1.0000 | INHALATION_SPRAY | Freq: Four times a day (QID) | RESPIRATORY_TRACT | Status: DC
  Administered 2020-06-03 – 2020-06-07 (×18): 1 via RESPIRATORY_TRACT
  Filled 2020-06-02: qty 4

## 2020-06-02 MED ORDER — CLOPIDOGREL BISULFATE 75 MG PO TABS
75.0000 mg | ORAL_TABLET | Freq: Every day | ORAL | Status: DC
  Administered 2020-06-03 – 2020-06-07 (×5): 75 mg via ORAL
  Filled 2020-06-02 (×5): qty 1

## 2020-06-02 MED ORDER — SODIUM CHLORIDE 0.9 % IV SOLN: 100.0000 mg | Freq: Every day | INTRAVENOUS | Status: DC

## 2020-06-02 MED ORDER — FUROSEMIDE 40 MG PO TABS: 40.0000 mg | ORAL_TABLET | Freq: Every day | ORAL | Status: DC | PRN

## 2020-06-02 NOTE — ED Triage Notes (Signed)
Pt arrives BIB [facility], pt called out to [facility] for SOB (ongoing) and chest pain, sinus tachy in the 130's, hypertensive. Aox4. 99%RA, son has Covid, last saw son a week ago.

## 2020-06-02 NOTE — ED Provider Notes (Signed)
4:20 PM Care assumed from Dr. Tyrone Nine.  At time of transfer of care, patient is awaiting results of CT scan of the abdomen and pelvis given the patient's abdominal pain with symptoms and leukocytosis.  Patient has been found to be Covid positive.  Anticipate ambulation with pulse oximetry as well as further evaluation.  Given her age, tachycardia, tachypnea, symptoms, I anticipate patient will require admission despite CT results.  Anticipate reassessment.  5:38 PM I went to reassess the patient and she is now hypoxic.  Oxygen saturation was around 84% on room air.  Placed her on 3 L with improvement.  She was feeling short of breath still.  Patient's creatinine has returned and she now has AKI.  Her creatinine went from a baseline of between 2 and 3 to now 7.43.  She does report she had some nausea, vomiting, and malaise in the setting of this illness.  I suspect the Covid is causing her to be more dehydrated.  We will give more fluids as we await the CT results prior to admission.  She will be admitted for AKI and new hypoxia she does not take oxygen normally.   5:54 PM Patient CT scan showed no acute diverticulitis but did show old diverticulosis.  Also postoperative changes with a replacement and lower spine.  Large hernia gastric hernia present as well as prior femorofemoral bypass grafting but no other new abnormalities.  There was all so a stable appearing soft tissue mass-like appearance near the common femoral artery.  CT scan did not show any acute findings, suspect symptoms are related to her AKI and Covid.  Patient was given more fluids and will be admitted for the new hypoxia with COVID-19 infection.   Clinical Impression: 1. Hypoxia   2. COVID-19   3. Shortness of breath   4. AKI (acute kidney injury) ([place])     Disposition: Admit  This note was prepared with assistance of Dragon voice recognition software. Occasional wrong-word or sound-a-like substitutions may have occurred due  to the inherent limitations of voice recognition software.      Gennette Shadix, Gwenyth Allegra, MD 06/02/20 [id number]

## 2020-06-02 NOTE — ED Provider Notes (Signed)
[facility] EMERGENCY DEPARTMENT Provider Note   CSN: [id number] Arrival date & time: 06/02/20  1359     History Chief Complaint  Patient presents with  . Shortness of Breath  . Loss of Consciousness    Alyssa [name] is a 79 y.o. female.  79 yo F with a chief complaints of shortness of breath.  This been going on for the past week or 2.  She is a chronic cough that is unchanged.  No chest pain or pressure.  Having some abdominal pain worse on both sides.  Decreased oral intake because she said she does not feel like eating.  Is worsening abdominal pain with eating.  No fevers.  No bowel movements.  No urinary symptoms.   The history is provided by the patient.  Shortness of Breath Severity:  Moderate Onset quality:  Gradual Duration:  2 weeks Timing:  Constant Progression:  Worsening Chronicity:  New Relieved by:  Nothing Worsened by:  Nothing Ineffective treatments:  None tried Associated symptoms: abdominal pain and syncope   Associated symptoms: no chest pain, no fever, no headaches, no vomiting and no wheezing   Loss of Consciousness Associated symptoms: nausea and shortness of breath   Associated symptoms: no chest pain, no dizziness, no fever, no headaches, no palpitations and no vomiting        Past Medical History:  Diagnosis Date  . Acute respiratory failure with hypoxia ([place]) 11/01/2019  . Anxiety   . Arthritis   . Chest pain   . Chronic kidney disease   . Dyspnea   . Elevated troponin 12/13/2018  . Headache(784.0)   . Hyperlipidemia   . Hypertension   . Joint pain   . Leg pain   . Peripheral neuropathy   . Peripheral vascular disease [facility])     Patient Active Problem List   Diagnosis Date Noted  . Acute respiratory failure due to COVID-19 ([place]) 06/02/2020  . Acute renal failure superimposed on chronic kidney disease ([place]) 02/06/2020  . Leukocytosis 02/06/2020  . Thrombocytosis ([place]) 02/06/2020  . Palliative care  encounter   . Constipation 01/05/2020  . Severe protein-calorie malnutrition ([place]) 01/04/2020  . Acute on chronic renal failure ([place]) 01/04/2020  . Hyperkalemia 01/03/2020  . AKI (acute kidney injury) ([place]) 01/03/2020  . Acute respiratory failure with hypoxia ([place]) 11/01/2019  . UTI (urinary tract infection) 11/01/2019  . CAP (community acquired pneumonia) 11/01/2019  . Acute cystitis without hematuria   . Encephalopathy 09/18/2019  . SAH (subarachnoid hemorrhage) ([place]) 09/18/2019  . Dementia ([place]) 09/18/2019  . Metabolic acidosis 47/06/6282  . ARF (acute renal failure) ([place]) 09/17/2019  . Acute encephalopathy 09/17/2019  . Abnormal CT scan, colon   . Diarrhea   . Diverticulitis 06/07/2019  . Depression with anxiety 06/07/2019  . DNR (do not resuscitate) 03/23/2019  . Volume overload 03/23/2019  . Edema 03/23/2019  . Acute renal failure (ARF) ([place]) 03/08/2019  . Hypokalemia 03/08/2019  . Palliative care by specialist   . Goals of care, counseling/discussion   . Altered mental status   . Encephalopathy, toxic 12/24/2018  . Aspiration pneumonia of both lower lobes due to gastric secretions ([place]) 12/24/2018  . Non-ST elevation (NSTEMI) myocardial infarction ([place])   . Chest pain   . Dyspnea on exertion   . Elevated troponin level 12/09/2018  . Chronic migraine 03/27/2018  . Lumbar radiculopathy 03/27/2018  . Gait abnormality 03/27/2018  . CKD (chronic kidney disease) stage 4, GFR 15-29 ml/min ([facility]) 12/28/2016  .  Symptomatic anemia 12/28/2016  . Chronic pain syndrome 12/28/2016  . Benzodiazepine withdrawal without complication ([place]) 87/56/4332  . Chronic right shoulder pain 12/06/2016  . Fall 09/17/2016  . Fracture of humeral head, closed, right, initial encounter 09/17/2016  . Rib fractures 09/17/2016  . Multiple falls 09/17/2016  . Severe muscle deconditioning 09/17/2016  . Failure to thrive in adult 09/17/2016  . Melena 04/25/2016  . Transient hypotension 04/25/2016  .  Anxiety state 04/25/2016  . Tobacco abuse 04/25/2016  . Hyperlipidemia 04/25/2016  . Syncope 04/24/2016  . Near syncope 04/24/2016  . Gait difficulty 01/12/2016  . Foot drop, left 01/12/2016  . Severe recurrent major depression without psychotic features ([place]) 08/28/2015  . Spondylolisthesis at L4-L5 level 07/30/2015  . PVD (peripheral vascular disease) ([place]) 07/29/2014  . Weakness-Bilateral arm/leg 07/29/2014  . Numbness-left leg 07/29/2014  . Swelling of limb-Legs 07/29/2014  . Atherosclerosis of native arteries of the extremities with intermittent claudication 09/30/2011    Past Surgical History:  Procedure Laterality Date  . ABDOMINAL ANGIOGRAM N/A 10/29/2011   Procedure: ABDOMINAL ANGIOGRAM;  Surgeon: Elam Dutch, MD;  Location[facility] C[facility] LAB;  Service: Cardiovascular;  Laterality: N/A;  . BACK SURGERY    . FEMORAL-FEMORAL BYPASS GRAFT  08/31/2010  . FLEXIBLE SIGMOIDOSCOPY N/A 06/12/2019   Procedure: FLEXIBLE SIGMOIDOSCOPY;  Surgeon: Ladene Artist, MD;  Location: [facility] ENDOSCOPY;  Service: Endoscopy;  Laterality: N/A;  Patient had little bit to eat this morning so this will be unsedated.  Marland Kitchen JOINT REPLACEMENT Right 2012   Hip  . LOWER EXTREMITY ANGIOGRAPHY  06/14/2017   Procedure: Lower Extremity Angiography;  Surgeon: Adrian Prows, MD;  Location: [facility] CV LAB;  Service: Cardiovascular;;  Bilateral limited angio performed  . RENAL ANGIOGRAPHY N/A 06/14/2017   Procedure: Renal Angiography;  Surgeon: Adrian Prows, MD;  Location: [facility] CV LAB;  Service: Cardiovascular;  Laterality: N/A;  . TOTAL HIP ARTHROPLASTY     right     OB History   No obstetric history on file.     Family History  Problem Relation Age of Onset  . Heart attack Mother   . Heart disease Mother   . Hyperlipidemia Mother   . Hypertension Mother   . Heart attack Father   . Heart disease Father        Before age 40  . Hyperlipidemia Father   . Hypertension Father   . Cancer Sister         brain tumor  . Aneurysm Brother        brain  . Colon cancer Neg Hx   . Liver cancer Neg Hx     Social History   Tobacco Use  . Smoking status: Current Every Day Smoker    Packs/day: 1.00    Years: 27.00    Pack years: 27.00    Types: Cigarettes    Last attempt to quit: 07/23/2019    Years since quitting: 0.8  . Smokeless tobacco: Never Used  Vaping Use  . Vaping Use: Never used  Substance Use Topics  . Alcohol use: No  . Drug use: No    Home Medications Prior to Admission medications   Medication Sig Start Date End Date Taking? Authorizing Provider  acetaminophen (TYLENOL) 325 MG tablet Take 2 tablets (650 mg total) by mouth every 6 (six) hours as needed for mild pain, moderate pain or fever (or Fever >/= 101). 06/16/19   Hongalgi, Lenis Dickinson, MD  albuterol (PROAIR HFA) 108 (90 Base) MCG/ACT inhaler  Inhale 2 puffs into the lungs every 6 (six) hours as needed for wheezing or shortness of breath.    [provider]  amLODipine (NORVASC) 5 MG tablet Take 1.5 tablets (7.5 mg total) by mouth daily. 04/14/20   Troy Sine, MD  aspirin 81 MG EC tablet Take 1 tablet (81 mg total) by mouth daily. 01/09/19   Caren Griffins, MD  busPIRone (BUSPAR) 15 MG tablet Take 15 mg by mouth 3 (three) times daily as needed for anxiety. 02/28/19   [provider]  carvedilol (COREG) 12.5 MG tablet Take 1 tablet (12.5 mg total) by mouth 2 (two) times daily with a meal. 02/10/20   Little Ishikawa, MD  clopidogrel (PLAVIX) 75 MG tablet Take 1 tablet (75 mg total) by mouth daily. 01/09/19   Caren Griffins, MD  diclofenac Sodium (VOLTAREN) 1 % GEL Apply 2 g topically 4 (four) times daily. 01/15/20   [provider]  donepezil (ARICEPT) 5 MG tablet Take 5 mg by mouth daily.    [provider]  DULoxetine (CYMBALTA) 30 MG capsule Take 30 mg by mouth daily.    [provider]  feeding supplement, ENSURE ENLIVE, (ENSURE ENLIVE) LIQD Take 237 mLs by mouth 2 (two)  times daily between meals. 06/16/19   Hongalgi, Lenis Dickinson, MD  Ferrous Sulfate (IRON HIGH-POTENCY) 325 MG TABS Take 325 mg by mouth daily with breakfast. 06/30/19   Hongalgi, Lenis Dickinson, MD  furosemide (LASIX) 20 MG tablet Take 2 tablets (40 mg total) by mouth daily as needed for fluid (Fluid overload.  Ensure patient maintains euvolemia.  Also I will for hyperkalemia). 01/08/20   Allie Bossier, MD  gabapentin (NEURONTIN) 100 MG capsule Take 100 mg by mouth 3 (three) times daily. 10/25/19   [provider]  Multiple Vitamin (MULTIVITAMIN WITH MINERALS) TABS tablet Take 1 tablet by mouth daily. 06/17/19   Hongalgi, Lenis Dickinson, MD  nitroGLYCERIN (NITROSTAT) 0.4 MG SL tablet Place 1 tablet (0.4 mg total) under the tongue every 5 (five) minutes as needed for chest pain. 01/07/20   Allie Bossier, MD  pantoprazole (PROTONIX) 40 MG tablet Take 1 tablet (40 mg total) by mouth daily. 06/16/19   Hongalgi, Lenis Dickinson, MD  rosuvastatin (CRESTOR) 40 MG tablet Take 1 tablet (40 mg total) by mouth at bedtime. Patient taking differently: Take 40 mg by mouth daily.  06/16/19   Hongalgi, Lenis Dickinson, MD  sodium bicarbonate 650 MG tablet Take 2 tablets (1,300 mg total) by mouth 2 (two) times daily. 01/07/20   Allie Bossier, MD    Allergies    Patient has no known allergies.  Review of Systems   Review of Systems  Constitutional: Negative for chills and fever.  HENT: Negative for congestion and rhinorrhea.   Eyes: Negative for redness and visual disturbance.  Respiratory: Positive for shortness of breath. Negative for wheezing.   Cardiovascular: Positive for syncope. Negative for chest pain and palpitations.  Gastrointestinal: Positive for abdominal pain, constipation and nausea. Negative for vomiting.  Genitourinary: Negative for dysuria and urgency.  Musculoskeletal: Negative for arthralgias and myalgias.  Skin: Negative for pallor and wound.  Neurological: Negative for dizziness and headaches.    Physical  Exam Updated Vital Signs BP (!) 156/89   Pulse 84   Temp 98.5 F (36.9 C)   Resp 18   SpO2 100%   Physical Exam Vitals and nursing note reviewed.  Constitutional:      General: She is not  in acute distress.    Appearance: She is not diaphoretic.     Comments: Cachectic  HENT:     Head: Normocephalic and atraumatic.  Eyes:     Pupils: Pupils are equal, round, and reactive to light.  Cardiovascular:     Rate and Rhythm: Regular rhythm. Tachycardia present.     Heart sounds: No murmur heard.  No friction rub. No gallop.   Pulmonary:     Effort: Pulmonary effort is normal.     Breath sounds: No wheezing or rales.  Abdominal:     General: There is no distension.     Palpations: Abdomen is soft.     Tenderness: There is abdominal tenderness.     Comments: Mild distention, diffuse tenderness  Musculoskeletal:        General: No tenderness.     Cervical back: Normal range of motion and neck supple.  Skin:    General: Skin is warm and dry.  Neurological:     Mental Status: She is alert and oriented to person, place, and time.  Psychiatric:        Behavior: Behavior normal.     ED Results / Procedures / Treatments   Labs (all labs ordered are listed, but only abnormal results are displayed) Labs Reviewed  SARS CORONAVIRUS 2 BY RT PCR (HOSPITAL ORDER, [facility]) - Abnormal; Notable for the following components:      Result Value   SARS Coronavirus 2 POSITIVE (*)    All other components within normal limits  CBC WITH DIFFERENTIAL/PLATELET - Abnormal; Notable for the following components:   WBC 15.3 (*)    RBC 3.35 (*)    Hemoglobin 9.7 (*)    HCT 29.8 (*)    RDW 16.8 (*)    Platelets 404 (*)    nRBC 0.4 (*)    Neutro Abs 12.4 (*)    Abs Immature Granulocytes 0.18 (*)    All other components within normal limits  COMPREHENSIVE METABOLIC PANEL - Abnormal; Notable for the following components:   Chloride 117 (*)    CO2 7 (*)    BUN 78 (*)     Creatinine, Ser 7.43 (*)    Albumin 3.2 (*)    GFR calc non Af Amer 5 (*)    GFR calc Af Amer 6 (*)    Anion gap 19 (*)    All other components within normal limits  LIPASE, BLOOD - Abnormal; Notable for the following components:   Lipase 118 (*)    All other components within normal limits  URINALYSIS, ROUTINE W REFLEX MICROSCOPIC - Abnormal; Notable for the following components:   APPearance CLOUDY (*)    Hgb urine dipstick MODERATE (*)    Protein, ur 100 (*)    Leukocytes,Ua LARGE (*)    WBC, UA >50 (*)    Bacteria, UA MANY (*)    Non Squamous Epithelial 0-5 (*)    All other components within normal limits  CBC - Abnormal; Notable for the following components:   WBC 14.9 (*)    RBC 3.07 (*)    Hemoglobin 8.9 (*)    HCT 27.3 (*)    RDW 16.8 (*)    nRBC 0.6 (*)    All other components within normal limits  CREATININE, SERUM - Abnormal; Notable for the following components:   Creatinine, Ser 6.99 (*)    GFR calc non Af Amer 5 (*)    GFR calc Af Amer 6 (*)  All other components within normal limits  CBC WITH DIFFERENTIAL/PLATELET - Abnormal; Notable for the following components:   WBC 14.4 (*)    RBC 2.98 (*)    Hemoglobin 8.6 (*)    HCT 26.4 (*)    RDW 17.1 (*)    nRBC 0.3 (*)    Neutro Abs 13.0 (*)    Abs Immature Granulocytes 0.22 (*)    All other components within normal limits  COMPREHENSIVE METABOLIC PANEL - Abnormal; Notable for the following components:   Chloride 119 (*)    CO2 <7 (*)    Glucose, Bld 223 (*)    BUN 77 (*)    Creatinine, Ser 6.27 (*)    Calcium 8.8 (*)    Albumin 2.9 (*)    GFR calc non Af Amer 6 (*)    GFR calc Af Amer 7 (*)    All other components within normal limits  C-REACTIVE PROTEIN - Abnormal; Notable for the following components:   CRP 9.8 (*)    All other components within normal limits  D-DIMER, QUANTITATIVE (NOT AT [facility]) - Abnormal; Notable for the following components:   D-Dimer, Quant 5.77 (*)    All other components  within normal limits  FERRITIN - Abnormal; Notable for the following components:   Ferritin 1,642 (*)    All other components within normal limits  PHOSPHORUS - Abnormal; Notable for the following components:   Phosphorus 7.2 (*)    All other components within normal limits  TROPONIN I (HIGH SENSITIVITY) - Abnormal; Notable for the following components:   Troponin I (High Sensitivity) 41 (*)    All other components within normal limits  CULTURE, BLOOD (ROUTINE X 2)  CULTURE, BLOOD (ROUTINE X 2)  URINE CULTURE  TSH  MAGNESIUM    EKG None  Radiology CT ABDOMEN PELVIS WO CONTRAST  Result Date: 06/02/2020 CLINICAL DATA:  Shortness of breath, chest pain and abdominal pain. EXAM: CT ABDOMEN AND PELVIS WITHOUT CONTRAST TECHNIQUE: Multidetector CT imaging of the abdomen and pelvis was performed following the standard protocol without IV contrast. COMPARISON:  May 02, 2020 and November 01, 2019 FINDINGS: Lower chest: A cluster of 2 mm noncalcified lung nodules are seen within the posterolateral aspect of the right lung base. Hepatobiliary: No focal liver abnormality is seen. No gallstones, gallbladder wall thickening, or biliary dilatation. Pancreas: Unremarkable. No pancreatic ductal dilatation or surrounding inflammatory changes. Spleen: Normal in size without focal abnormality. Adrenals/Urinary Tract: Adrenal glands are unremarkable. Kidneys are normal, without renal calculi, focal lesion, or hydronephrosis. Bladder is unremarkable. Stomach/Bowel: There is a large gastric hernia. Appendix appears normal. No evidence of bowel dilatation. Noninflamed diverticula are seen throughout the sigmoid colon. Vascular/Lymphatic: There is marked severity calcification of the abdominal aorta and bilateral common iliac arteries, without evidence of aneurysmal dilatation. 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 A predominant stable 2.8 cm x 2.4 cm mildly lobulated area of heterogeneous low attenuation is seen within the  region anterior to the right common femoral artery (axial CT image 58, CT series number 3). Reproductive: The uterus is mildly enlarged and heterogeneous in appearance. The bilateral adnexa are limited in evaluation secondary to streak artifact. Other: No abdominal wall hernia or abnormality. No abdominopelvic ascites. Musculoskeletal: A total right hip replacement is seen with extensive amount of associated streak artifact. Subsequently limited evaluation of the adjacent osseous and soft tissue structures is noted. Bilateral metallic density pedicle screws are seen at the levels of L4 and L5. Metallic density operative  material is seen along the L4-L5 intervertebral disc space. IMPRESSION: 1. Sigmoid diverticulosis without evidence of acute diverticulitis. 2. Postoperative changes within the lower lumbar spine. 3. Total right hip replacement. 4. Large gastric hernia. 5. Evidence of prior fem-fem bypass graft. 6. Predominant stable area of lobulated low-attenuation anterior to the expected region of the right common femoral artery. While this may be vascular in origin, the presence of a stable soft tissue mass cannot be excluded. Aortic Atherosclerosis (ICD10-I70.0). Electronically Signed   By: Virgina Norfolk M.D.   On: 06/02/2020 17:49   DG Chest Port 1 View  Result Date: 06/02/2020 CLINICAL DATA:  Shortness of breath, COVID positive contact EXAM: PORTABLE CHEST 1 VIEW COMPARISON:  03/14/2020 FINDINGS: No new consolidation or edema. Chronic interstitial prominence. No pleural effusion or pneumothorax. Stable cardiomediastinal contours with normal heart size. IMPRESSION: No acute process in the chest. Electronically Signed   By: Macy Mis M.D.   On: 06/02/2020 14:35    Procedures Procedures (including critical care time)  Medications Ordered in ED Medications  heparin injection 5,000 Units (5,000 Units Subcutaneous Given 06/03/20 0615)  dextrose 5 % in lactated ringers infusion ( Intravenous New  Bag/Given 06/02/20 2352)  Ipratropium-Albuterol (COMBIVENT) respimat 1 puff (1 puff Inhalation Refused 06/03/20 0108)  dexamethasone (DECADRON) injection 6 mg (6 mg Intravenous Not Given 06/02/20 2059)  guaiFENesin-dextromethorphan (ROBITUSSIN DM) 100-10 MG/5ML syrup 10 mL (has no administration in time range)  chlorpheniramine-HYDROcodone (TUSSIONEX) 10-8 MG/5ML suspension 5 mL (has no administration in time range)  ondansetron (ZOFRAN) tablet 4 mg (has no administration in time range)    Or  ondansetron (ZOFRAN) injection 4 mg (has no administration in time range)  cefTRIAXone (ROCEPHIN) 1 g in sodium chloride 0.9 % 100 mL IVPB (0 g Intravenous Stopped 06/02/20 2227)  azithromycin (ZITHROMAX) 500 mg in sodium chloride 0.9 % 250 mL IVPB (0 mg Intravenous Stopped 06/02/20 2338)  remdesivir 200 mg in sodium chloride 0.9% 250 mL IVPB (0 mg Intravenous Stopped 06/02/20 2119)    Followed by  remdesivir 100 mg in sodium chloride 0.9 % 100 mL IVPB (has no administration in time range)  aspirin EC tablet 81 mg (has no administration in time range)  clopidogrel (PLAVIX) tablet 75 mg (has no administration in time range)  busPIRone (BUSPAR) tablet 15 mg (has no administration in time range)  donepezil (ARICEPT) tablet 5 mg (has no administration in time range)  acetaminophen (TYLENOL) tablet 650 mg (has no administration in time range)  rosuvastatin (CRESTOR) tablet 40 mg (has no administration in time range)  pantoprazole (PROTONIX) EC tablet 40 mg (has no administration in time range)  ferrous sulfate tablet 325 mg (has no administration in time range)  feeding supplement (ENSURE ENLIVE) (ENSURE ENLIVE) liquid 237 mL (has no administration in time range)  multivitamin with minerals tablet 1 tablet (has no administration in time range)  gabapentin (NEURONTIN) capsule 100 mg (100 mg Oral Given 06/02/20 2350)  DULoxetine (CYMBALTA) DR capsule 30 mg (has no administration in time range)  albuterol (VENTOLIN  HFA) 108 (90 Base) MCG/ACT inhaler 2 puff (has no administration in time range)  nitroGLYCERIN (NITROSTAT) SL tablet 0.4 mg (has no administration in time range)  sodium bicarbonate tablet 1,300 mg (1,300 mg Oral Given 06/03/20 0010)  furosemide (LASIX) tablet 40 mg (has no administration in time range)  diclofenac Sodium (VOLTAREN) 1 % topical gel 2 g (has no administration in time range)  carvedilol (COREG) tablet 12.5 mg (12.5 mg Oral Given 06/02/20 2350)  amLODipine (NORVASC) tablet 7.5 mg (7.5 mg Oral Given 06/03/20 0020)  metoprolol tartrate (LOPRESSOR) injection 5 mg (5 mg Intravenous Given 06/03/20 0051)  sodium chloride 0.9 % bolus 1,000 mL (0 mLs Intravenous Stopped 06/02/20 1759)  morphine 2 MG/ML injection 1 mg (1 mg Intravenous Given 06/02/20 1427)  ondansetron (ZOFRAN) injection 4 mg (4 mg Intravenous Given 06/02/20 1424)  sodium chloride 0.9 % bolus 1,000 mL (1,000 mLs Intravenous Bolus from Bag 06/02/20 1758)  dexamethasone (DECADRON) injection 6 mg (6 mg Intravenous Given 06/02/20 1828)    ED Course  I have reviewed the triage vital signs and the nursing notes.  Pertinent labs & imaging results that were available during my care of the patient were reviewed by me and considered in my medical decision making (see chart for details).    MDM Rules/Calculators/A&P                          79 yo F with a chief complaints of shortness of breath.  The patient describes decreased oral intake for the past couple weeks and abdominal pain for the past week.  Diffusely tender on abdominal exam.  Heart rate into the 130s.  Give a bolus of IV fluids treat pain and nausea chest x-ray CT scan of the abdomen pelvis.  Found to be covid +, likely admit.  Awaiting CT.  Signed out to Dr. Sherry Ruffing, please see their note for further details of care in the ED.   The patients results and plan were reviewed and discussed.   Any x-rays performed were independently reviewed by myself.   Differential  diagnosis were considered with the presenting HPI.  Medications  heparin injection 5,000 Units (5,000 Units Subcutaneous Given 06/03/20 0615)  dextrose 5 % in lactated ringers infusion ( Intravenous New Bag/Given 06/02/20 2352)  Ipratropium-Albuterol (COMBIVENT) respimat 1 puff (1 puff Inhalation Refused 06/03/20 0108)  dexamethasone (DECADRON) injection 6 mg (6 mg Intravenous Not Given 06/02/20 2059)  guaiFENesin-dextromethorphan (ROBITUSSIN DM) 100-10 MG/5ML syrup 10 mL (has no administration in time range)  chlorpheniramine-HYDROcodone (TUSSIONEX) 10-8 MG/5ML suspension 5 mL (has no administration in time range)  ondansetron (ZOFRAN) tablet 4 mg (has no administration in time range)    Or  ondansetron (ZOFRAN) injection 4 mg (has no administration in time range)  cefTRIAXone (ROCEPHIN) 1 g in sodium chloride 0.9 % 100 mL IVPB (0 g Intravenous Stopped 06/02/20 2227)  azithromycin (ZITHROMAX) 500 mg in sodium chloride 0.9 % 250 mL IVPB (0 mg Intravenous Stopped 06/02/20 2338)  remdesivir 200 mg in sodium chloride 0.9% 250 mL IVPB (0 mg Intravenous Stopped 06/02/20 2119)    Followed by  remdesivir 100 mg in sodium chloride 0.9 % 100 mL IVPB (has no administration in time range)  aspirin EC tablet 81 mg (has no administration in time range)  clopidogrel (PLAVIX) tablet 75 mg (has no administration in time range)  busPIRone (BUSPAR) tablet 15 mg (has no administration in time range)  donepezil (ARICEPT) tablet 5 mg (has no administration in time range)  acetaminophen (TYLENOL) tablet 650 mg (has no administration in time range)  rosuvastatin (CRESTOR) tablet 40 mg (has no administration in time range)  pantoprazole (PROTONIX) EC tablet 40 mg (has no administration in time range)  ferrous sulfate tablet 325 mg (has no administration in time range)  feeding supplement (ENSURE ENLIVE) (ENSURE ENLIVE) liquid 237 mL (has no administration in time range)  multivitamin with minerals tablet 1 tablet (has no  administration in time range)  gabapentin (NEURONTIN) capsule 100 mg (100 mg Oral Given 06/02/20 2350)  DULoxetine (CYMBALTA) DR capsule 30 mg (has no administration in time range)  albuterol (VENTOLIN HFA) 108 (90 Base) MCG/ACT inhaler 2 puff (has no administration in time range)  nitroGLYCERIN (NITROSTAT) SL tablet 0.4 mg (has no administration in time range)  sodium bicarbonate tablet 1,300 mg (1,300 mg Oral Given 06/03/20 0010)  furosemide (LASIX) tablet 40 mg (has no administration in time range)  diclofenac Sodium (VOLTAREN) 1 % topical gel 2 g (has no administration in time range)  carvedilol (COREG) tablet 12.5 mg (12.5 mg Oral Given 06/02/20 2350)  amLODipine (NORVASC) tablet 7.5 mg (7.5 mg Oral Given 06/03/20 0020)  metoprolol tartrate (LOPRESSOR) injection 5 mg (5 mg Intravenous Given 06/03/20 0051)  sodium chloride 0.9 % bolus 1,000 mL (0 mLs Intravenous Stopped 06/02/20 1759)  morphine 2 MG/ML injection 1 mg (1 mg Intravenous Given 06/02/20 1427)  ondansetron (ZOFRAN) injection 4 mg (4 mg Intravenous Given 06/02/20 1424)  sodium chloride 0.9 % bolus 1,000 mL (1,000 mLs Intravenous Bolus from Bag 06/02/20 1758)  dexamethasone (DECADRON) injection 6 mg (6 mg Intravenous Given 06/02/20 1828)    Vitals:   06/03/20 0049 06/03/20 0130 06/03/20 0200 06/03/20 0330  BP: (!) 190/107 (!) 171/112 (!) 154/103 (!) 156/89  Pulse: (!) 107 89  84  Resp: (!) 28 17 17 18   Temp:      TempSrc:      SpO2: 100% 100%  100%    Final diagnoses:  Hypoxia  COVID-19  Shortness of breath  AKI (acute kidney injury) ([place])    Admission/ observation were discussed with the admitting physician, patient and/or family and they are comfortable with the plan.   Final Clinical Impression(s) / ED Diagnoses Final diagnoses:  Hypoxia  COVID-19  Shortness of breath  AKI (acute kidney injury) [facility])    Rx / DC Orders ED Discharge Orders    None       Deno Etienne, DO 06/03/20 0710

## 2020-06-02 NOTE — ED Notes (Signed)
PAGED PROVIDER REGARDING PT BP

## 2020-06-02 NOTE — ED Notes (Signed)
Spoke with Dr. Jonelle Sidle regarding BP. Provider to put in medication orders

## 2020-06-02 NOTE — ED Notes (Signed)
Paged provider x2 regarding pt BP

## 2020-06-02 NOTE — H&P (Signed)
History and Physical   Alyssa [name] RSW:[id number] DOB: 09-12-1941 DOA: 06/02/2020  Referring MD/NP/PA: Dr. Sherry Ruffing  PCP: [name], DO   Outpatient Specialists: None  Patient coming from: Home  Chief Complaint: Shortness of breath and loss of consciousness  HPI: Alyssa [name] is a 79 y.o. female with medical history significant of dementia, chronic respiratory failure, followed by palliative care, chronic kidney disease stage IV, hypertension, hyperlipidemia, peripheral vascular disease, peripheral neuropathy who presented with shortness of breath over the last week.  Patient has had chronic pain and has been on home O2.  She started having chest pain and cough.  Some abdominal pain.  She is also lost appetite and has no oral intake.  Patient is not the best of historians but denied any known fever although she has some chills.  She has not been drinking also.  Patient found to have creatinine of more than 7.  Her previous creatinine was around 2-3.  Additionally she was found to be COVID-19 positive.  She is hypoxic but no obvious infiltrate on chest x-ray.  CT angiogram of the chest could not be done as patient has markedly elevated creatinine.  ED Course: Temperature 98.5 blood pressure 170s over 117 pulse 127 respirate of 35 oxygen sat 81% on 2 L.  Chemistry showed chloride of 117 BUN 78 creatinine 7.43 with sodium 143.  Gap of 19.  Albumin is 3.2 lipase 118.  White count 15.3 hemoglobin 9.7 platelets 404.  Urinalysis showed cloudy urine with large leukocytes many bacteria WBC more than 50.  TSH 1.872.  COVID-19 screen is positive.  Chest x-ray showed no acute process.  CT abdomen pelvis shows sigmoid diverticulosis with large gastric hernia.  Patient being admitted with sepsis due to COVID-19 infection as well as UTI.  Review of Systems: As per HPI otherwise 10 point review of systems negative.    Past Medical History:  Diagnosis Date  . Acute respiratory failure with  hypoxia ([place]) 11/01/2019  . Anxiety   . Arthritis   . Chest pain   . Chronic kidney disease   . Dyspnea   . Elevated troponin 12/13/2018  . Headache(784.0)   . Hyperlipidemia   . Hypertension   . Joint pain   . Leg pain   . Peripheral neuropathy   . Peripheral vascular disease [facility])     Past Surgical History:  Procedure Laterality Date  . ABDOMINAL ANGIOGRAM N/A 10/29/2011   Procedure: ABDOMINAL ANGIOGRAM;  Surgeon: Elam Dutch, MD;  Location: [facility] CATH LAB;  Service: Cardiovascular;  Laterality: N/A;  . BACK SURGERY    . FEMORAL-FEMORAL BYPASS GRAFT  08/31/2010  . FLEXIBLE SIGMOIDOSCOPY N/A 06/12/2019   Procedure: FLEXIBLE SIGMOIDOSCOPY;  Surgeon: Ladene Artist, MD;  Location: [facility] ENDOSCOPY;  Service: Endoscopy;  Laterality: N/A;  Patient had little bit to eat this morning so this will be unsedated.  Marland Kitchen JOINT REPLACEMENT Right 2012   Hip  . LOWER EXTREMITY ANGIOGRAPHY  06/14/2017   Procedure: Lower Extremity Angiography;  Surgeon: Adrian Prows, MD;  Location: [place] CV LAB;  Service: Cardiovascular;;  Bilateral limited angio performed  . RENAL ANGIOGRAPHY N/A 06/14/2017   Procedure: Renal Angiography;  Surgeon: Adrian Prows, MD;  Location: [place] CV LAB;  Service: Cardiovascular;  Laterality: N/A;  . TOTAL HIP ARTHROPLASTY     right     reports that she has been smoking cigarettes. She has a 27.00 pack-year smoking history. She has never used smokeless tobacco. She reports that  she does not drink alcohol and does not use drugs.  No Known Allergies  Family History  Problem Relation Age of Onset  . Heart attack Mother   . Heart disease Mother   . Hyperlipidemia Mother   . Hypertension Mother   . Heart attack Father   . Heart disease Father        Before age 59  . Hyperlipidemia Father   . Hypertension Father   . Cancer Sister        brain tumor  . Aneurysm Brother        brain  . Colon cancer Neg Hx   . Liver cancer Neg Hx      Prior to Admission  medications   Medication Sig Start Date End Date Taking? Authorizing Provider  acetaminophen (TYLENOL) 325 MG tablet Take 2 tablets (650 mg total) by mouth every 6 (six) hours as needed for mild pain, moderate pain or fever (or Fever >/= 101). 06/16/19   Hongalgi, Lenis Dickinson, MD  albuterol (PROAIR HFA) 108 (90 Base) MCG/ACT inhaler Inhale 2 puffs into the lungs every 6 (six) hours as needed for wheezing or shortness of breath.    [provider]  amLODipine (NORVASC) 5 MG tablet Take 1.5 tablets (7.5 mg total) by mouth daily. 04/14/20   Troy Sine, MD  aspirin 81 MG EC tablet Take 1 tablet (81 mg total) by mouth daily. 01/09/19   Caren Griffins, MD  busPIRone (BUSPAR) 15 MG tablet Take 15 mg by mouth 3 (three) times daily as needed for anxiety. 02/28/19   [provider]  carvedilol (COREG) 12.5 MG tablet Take 1 tablet (12.5 mg total) by mouth 2 (two) times daily with a meal. 02/10/20   Little Ishikawa, MD  clopidogrel (PLAVIX) 75 MG tablet Take 1 tablet (75 mg total) by mouth daily. 01/09/19   Caren Griffins, MD  diclofenac Sodium (VOLTAREN) 1 % GEL Apply 2 g topically 4 (four) times daily. 01/15/20   [provider]  donepezil (ARICEPT) 5 MG tablet Take 5 mg by mouth daily.    [provider]  DULoxetine (CYMBALTA) 30 MG capsule Take 30 mg by mouth daily.    [provider]  feeding supplement, ENSURE ENLIVE, (ENSURE ENLIVE) LIQD Take 237 mLs by mouth 2 (two) times daily between meals. 06/16/19   Hongalgi, Lenis Dickinson, MD  Ferrous Sulfate (IRON HIGH-POTENCY) 325 MG TABS Take 325 mg by mouth daily with breakfast. 06/30/19   Hongalgi, Lenis Dickinson, MD  furosemide (LASIX) 20 MG tablet Take 2 tablets (40 mg total) by mouth daily as needed for fluid (Fluid overload.  Ensure patient maintains euvolemia.  Also I will for hyperkalemia). 01/08/20   Allie Bossier, MD  gabapentin (NEURONTIN) 100 MG capsule Take 100 mg by mouth 3 (three) times daily. 10/25/19   [provider]  Multiple Vitamin (MULTIVITAMIN WITH MINERALS) TABS tablet Take 1 tablet by mouth daily. 06/17/19   Hongalgi, Lenis Dickinson, MD  nitroGLYCERIN (NITROSTAT) 0.4 MG SL tablet Place 1 tablet (0.4 mg total) under the tongue every 5 (five) minutes as needed for chest pain. 01/07/20   Allie Bossier, MD  pantoprazole (PROTONIX) 40 MG tablet Take 1 tablet (40 mg total) by mouth daily. 06/16/19   Hongalgi, Lenis Dickinson, MD  rosuvastatin (CRESTOR) 40 MG tablet Take 1 tablet (40 mg total) by mouth at bedtime. Patient taking differently: Take 40 mg by mouth daily.  06/16/19   Hongalgi, Lenis Dickinson, MD  sodium bicarbonate 650 MG tablet Take 2 tablets (1,300 mg total) by mouth 2 (two) times daily. 01/07/20   Allie Bossier, MD    Physical Exam: Vitals:   06/02/20 1530 06/02/20 1539 06/02/20 1719 06/02/20 1736  BP:  (!) 175/126    Pulse:   82 91  Resp:  (!) 25 (!) 22 (!) 23  Temp: 98.5 F (36.9 C)     TempSrc:      SpO2:   100% (!) 81%      Constitutional: Acutely ill looking, weak, shaky Vitals:   06/02/20 1530 06/02/20 1539 06/02/20 1719 06/02/20 1736  BP:  (!) 175/126    Pulse:   82 91  Resp:  (!) 25 (!) 22 (!) 23  Temp: 98.5 F (36.9 C)     TempSrc:      SpO2:   100% (!) 81%   Eyes: PERRL, lids and conjunctivae normal ENMT: Mucous membranes are Dry. Posterior pharynx clear of any exudate or lesions.Normal dentition.  Neck: normal, supple, no masses, no thyromegaly Respiratory: Decreased AE bilaterally with coarse BS , no crackles. Normal respiratory effort. No accessory muscle use.  Cardiovascular: Sinus tachycardia, no murmurs / rubs / gallops. No extremity edema. 2+ pedal pulses. No carotid bruits.  Abdomen: no tenderness, no masses palpated. No hepatosplenomegaly. Bowel sounds positive.  Musculoskeletal: no clubbing / cyanosis. No joint deformity upper and lower extremities. Good ROM, no contractures. Normal muscle tone.  Skin: no rashes, lesions, ulcers. No induration Neurologic: CN  2-12 grossly intact. Sensation intact, DTR normal. Strength 5/5 in all 4.  Psychiatric: Normal judgment and insight. Alert and oriented x 3. Normal mood.     Labs on Admission: I have personally reviewed following labs and imaging studies  CBC: Recent Labs  Lab 06/02/20 1512  WBC 15.3*  NEUTROABS 12.4*  HGB 9.7*  HCT 29.8*  MCV 89.0  PLT 867*   Basic Metabolic Panel: Recent Labs  Lab 06/02/20 1512  NA 143  K 4.8  CL 117*  CO2 7*  GLUCOSE 93  BUN 78*  CREATININE 7.43*  CALCIUM 9.3   GFR: CrCl cannot be calculated (Unknown ideal weight.). Liver Function Tests: Recent Labs  Lab 06/02/20 1512  AST 18  ALT 12  ALKPHOS 107  BILITOT 0.6  PROT 7.2  ALBUMIN 3.2*   Recent Labs  Lab 06/02/20 1512  LIPASE 118*   No results for input(s): AMMONIA in the last 168 hours. Coagulation Profile: No results for input(s): INR, PROTIME in the last 168 hours. Cardiac Enzymes: No results for input(s): CKTOTAL, CKMB, CKMBINDEX, TROPONINI in the last 168 hours. BNP (last 3 results) No results for input(s): PROBNP in the last 8760 hours. HbA1C: No results for input(s): HGBA1C in the last 72 hours. CBG: No results for input(s): GLUCAP in the last 168 hours. Lipid Profile: No results for input(s): CHOL, HDL, LDLCALC, TRIG, CHOLHDL, LDLDIRECT in the last 72 hours. Thyroid Function Tests: Recent Labs    06/02/20 1512  TSH 1.872   Anemia Panel: No results for input(s): VITAMINB12, FOLATE, FERRITIN, TIBC, IRON, RETICCTPCT in the last 72 hours. Urine analysis:    Component Value Date/Time   COLORURINE YELLOW 06/02/2020 1515   APPEARANCEUR CLOUDY (A) 06/02/2020 1515   LABSPEC 1.013 06/02/2020 1515   PHURINE 5.0 06/02/2020 1515   GLUCOSEU NEGATIVE 06/02/2020 1515   HGBUR MODERATE (A) 06/02/2020 1515   BILIRUBINUR NEGATIVE 06/02/2020 [place] 06/02/2020 1515   PROTEINUR 100 (A) 06/02/2020 1515  UROBILINOGEN 0.2 08/24/2015 1737   NITRITE NEGATIVE  06/02/2020 1515   LEUKOCYTESUR LARGE (A) 06/02/2020 1515   Sepsis Labs: @LABRCNTIP (procalcitonin:4,lacticidven:4) ) Recent Results (from the past 240 hour(s))  SARS Coronavirus 2 by RT PCR (hospital order, performed in [facility] hospital lab) Nasopharyngeal Nasopharyngeal Swab     Status: Abnormal   Collection Time: 06/02/20  2:15 PM   Specimen: Nasopharyngeal Swab  Result Value Ref Range Status   SARS Coronavirus 2 POSITIVE (A) NEGATIVE Final    Comment: RESULT CALLED TO, READ BACK BY AND VERIFIED WITH: E,ADKINS @1604  06/02/20 EB (NOTE) SARS-CoV-2 target nucleic acids are DETECTED  SARS-CoV-2 RNA is generally detectable in upper respiratory specimens  during the acute phase of infection.  Positive results are indicative  of the presence of the identified virus, but do not rule out bacterial infection or co-infection with other pathogens not detected by the test.  Clinical correlation with patient history and  other diagnostic information is necessary to determine patient infection status.  The expected result is negative.  Fact Sheet for Patients:   StrictlyIdeas.no   Fact Sheet for Healthcare Providers:   BankingDealers.co.za    This test is not yet approved or cleared by the [place] FDA and  has been authorized for detection and/or diagnosis of SARS-CoV-2 by FDA under an Emergency Use Authorization (EUA).  This EUA will remain in effect (meaning this test can be  used) for the duration of  the COVID-19 declaration under Section 564(b)(1) of the Act, 21 U.S.C. section 360-bbb-3(b)(1), unless the authorization is terminated or revoked sooner.  Performed at [facility], [place] [address], [place], [place] [address]      Radiological Exams on Admission: CT ABDOMEN PELVIS WO CONTRAST  Result Date: 06/02/2020 CLINICAL DATA:  Shortness of breath, chest pain and abdominal pain. EXAM: CT ABDOMEN AND PELVIS WITHOUT  CONTRAST TECHNIQUE: Multidetector CT imaging of the abdomen and pelvis was performed following the standard protocol without IV contrast. COMPARISON:  May 02, 2020 and November 01, 2019 FINDINGS: Lower chest: A cluster of 2 mm noncalcified lung nodules are seen within the posterolateral aspect of the right lung base. Hepatobiliary: No focal liver abnormality is seen. No gallstones, gallbladder wall thickening, or biliary dilatation. Pancreas: Unremarkable. No pancreatic ductal dilatation or surrounding inflammatory changes. Spleen: Normal in size without focal abnormality. Adrenals/Urinary Tract: Adrenal glands are unremarkable. Kidneys are normal, without renal calculi, focal lesion, or hydronephrosis. Bladder is unremarkable. Stomach/Bowel: There is a large gastric hernia. Appendix appears normal. No evidence of bowel dilatation. Noninflamed diverticula are seen throughout the sigmoid colon. Vascular/Lymphatic: There is marked severity calcification of the abdominal aorta and bilateral common iliac arteries, without evidence of aneurysmal dilatation. 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 A predominant stable 2.8 cm x 2.4 cm mildly lobulated area of heterogeneous low attenuation is seen within the region anterior to the right common femoral artery (axial CT image 58, CT series number 3). Reproductive: The uterus is mildly enlarged and heterogeneous in appearance. The bilateral adnexa are limited in evaluation secondary to streak artifact. Other: No abdominal wall hernia or abnormality. No abdominopelvic ascites. Musculoskeletal: A total right hip replacement is seen with extensive amount of associated streak artifact. Subsequently limited evaluation of the adjacent osseous and soft tissue structures is noted. Bilateral metallic density pedicle screws are seen at the levels of L4 and L5. Metallic density operative material is seen along the L4-L5 intervertebral disc space. IMPRESSION: 1. Sigmoid diverticulosis without  evidence of acute  diverticulitis. 2. Postoperative changes within the lower lumbar spine. 3. Total right hip replacement. 4. Large gastric hernia. 5. Evidence of prior fem-fem bypass graft. 6. Predominant stable area of lobulated low-attenuation anterior to the expected region of the right common femoral artery. While this may be vascular in origin, the presence of a stable soft tissue mass cannot be excluded. Aortic Atherosclerosis (ICD10-I70.0). Electronically Signed   By: Virgina Norfolk M.D.   On: 06/02/2020 17:49   DG Chest Port 1 View  Result Date: 06/02/2020 CLINICAL DATA:  Shortness of breath, COVID positive contact EXAM: PORTABLE CHEST 1 VIEW COMPARISON:  03/14/2020 FINDINGS: No new consolidation or edema. Chronic interstitial prominence. No pleural effusion or pneumothorax. Stable cardiomediastinal contours with normal heart size. IMPRESSION: No acute process in the chest. Electronically Signed   By: Macy Mis M.D.   On: 06/02/2020 14:35    EKG: Independently reviewed.  Sinus tachycardia otherwise no significant ST changes  Assessment/Plan Principal Problem:   Acute respiratory failure due to COVID-19 [facility]) Active Problems:   Hyperlipidemia   Failure to thrive in adult   Chronic pain syndrome   Diverticulitis   Dementia (HCC)   Acute respiratory failure with hypoxia (HCC)   UTI (urinary tract infection)   Acute renal failure superimposed on chronic kidney disease ([place])     #1 acute on chronic respiratory failure due to COVID-19 infection: Patient will be admitted and be treated for COVID-19 pneumonia.  She probably has early infiltrates not seen on chest x-ray.  We could not get CT chest with contrast rule out PE due to patient's renal function.  Will treat empirically and placed on the protocol for COVID-19 pneumonia since she is hypoxic.  Could just be bronchitis.  Remdesivir and dexamethasone as well as breathing treatments will be offered.  Rocephin and Zithromax  #2  UTI: IV Rocephin empirically.  Await urine culture as well as blood cultures.  #3 acute on chronic kidney disease: BUN/creatinine elevated most likely due to dehydration.  Nephrology consulted.  Follow recommendations.  Aggressive fluid resuscitation.  #4 dementia: No agitation.  Continue close monitoring  #5 chronic pain syndrome: Continue pain management  #6 failure to thrive in adult: Patient has palliative care follow-up and is a DNR.   DVT prophylaxis: Heparin Code Status: DNR Family Communication: No family at bedside Disposition Plan: To be determined Consults called: None Admission status: Inpatient  Severity of Illness: The appropriate patient status for this patient is INPATIENT. Inpatient status is judged to be reasonable and necessary in order to provide the required intensity of service to ensure the patient's safety. The patient's presenting symptoms, physical exam findings, and initial radiographic and laboratory data in the context of their chronic comorbidities is felt to place them at high risk for further clinical deterioration. Furthermore, it is not anticipated that the patient will be medically stable for discharge from the hospital within 2 midnights of admission. The following factors support the patient status of inpatient.   " The patient's presenting symptoms include shortness of breath and weakness. " The worrisome physical exam findings include coarse breath sound and chronically ill looking. " The initial radiographic and laboratory data are worrisome because of chest x-ray showing no acute findings but positive for COVID-19 and creatinine of 7. " The chronic co-morbidities include dementia.   * I certify that at the point of admission it is my clinical judgment that the patient will require inpatient hospital care spanning beyond 2 midnights from the point of  admission due to high intensity of service, high risk for further deterioration and high frequency of  surveillance required.Barbette Merino MD [facility] Pager [phone]  If 7PM-7AM, please contact night-coverage [email or web address] Password [facility]  06/02/2020, 6:22 PM

## 2020-06-02 NOTE — Consult Note (Signed)
[facility] Renal Consultation Note  Requesting MD: Tegeler Indication for Consultation: A on CRF  HPI:  Alyssa [name] is a 79 y.o. 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  She is followed at [facility] by Dr. 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  On that visit crt was 2.72.  Concern was voiced over her frailty and thoughts were made known that patient possibly not a dialysis candidate at that time-  No preparations made for dialysis even though GFR low.  It also is noted that she is followed by palliative care and is a DNR status. She is brought to ER tonight with CP, abdominal pain , decreased appetite and no oral intake.  According to triage nurse-  pts son had COVID.  Her labs show her to be COVID positive and also with crt 7.43- again noted to be 2.7 in July.  Other labs of note-  Troponin of 41, bicarb of 7, WBC 15.3, hgb 9.7.  U/A showing 100 of protein - mod blood- 6-10 RBC and >50 WBC- LE pos but nitrite neg.  CT of abdomen shows no hydro.  She is tachy, hypertensive - hypoxic but no infiltrate.  Chart fully reviewed- including exam from  EDP and Hospitalist-  No edema- NAD-  bolused IVF- now on LR at 125 per hour  Creatinine, Ser  Date/Time Value Ref Range Status  06/02/2020 03:12 PM 7.43 (H) 0.44 - 1.00 mg/dL Final  02/10/2020 03:32 AM 2.11 (H) 0.44 - 1.00 mg/dL Final  02/09/2020 02:40 AM 2.19 (H) 0.44 - 1.00 mg/dL Final  02/08/2020 08:37 AM 2.72 (H) 0.44 - 1.00 mg/dL Final  02/07/2020 04:21 AM 3.46 (H) 0.44 - 1.00 mg/dL Final  02/06/2020 12:25 PM 3.76 (H) 0.44 - 1.00 mg/dL Final  01/08/2020 03:18 AM 1.83 (H) 0.44 - 1.00 mg/dL Final  01/07/2020 04:21 AM 1.72 (H) 0.44 - 1.00 mg/dL Final  01/06/2020 03:16 AM 1.69 (H) 0.44 - 1.00  mg/dL Final  01/05/2020 02:38 AM 1.86 (H) 0.44 - 1.00 mg/dL Final  01/04/2020 04:41 PM 1.86 (H) 0.44 - 1.00 mg/dL Final  01/04/2020 02:33 AM 2.11 (H) 0.44 - 1.00 mg/dL Final  01/03/2020 06:02 AM 2.37 (H) 0.44 - 1.00 mg/dL Final  01/02/2020 11:28 PM 2.45 (H) 0.44 - 1.00 mg/dL Final  01/02/2020 12:46 PM 2.82 (H) 0.44 - 1.00 mg/dL Final  11/04/2019 04:24 AM 1.61 (H) 0.44 - 1.00 mg/dL Final  11/02/2019 06:18 AM 1.94 (H) 0.44 - 1.00 mg/dL Final  11/01/2019 08:19 AM 2.20 (H) 0.44 - 1.00 mg/dL Final  11/01/2019 03:52 AM 2.72 (H) 0.44 - 1.00 mg/dL Final  10/31/2019 04:47 PM 2.56 (H) 0.44 - 1.00 mg/dL Final  09/22/2019 02:34 AM 2.80 (H) 0.44 - 1.00 mg/dL Final  09/21/2019 06:06 AM 3.28 (H) 0.44 - 1.00 mg/dL Final  09/20/2019 03:36 AM 3.22 (H) 0.44 - 1.00 mg/dL Final  09/19/2019 06:32 AM 3.49 (H) 0.44 - 1.00 mg/dL Final  09/18/2019 12:51 PM 4.16 (H) 0.44 - 1.00 mg/dL Final  09/17/2019 10:23 PM 4.86 (H) 0.44 - 1.00 mg/dL Final  09/17/2019 02:08 PM 5.07 (H) 0.44 - 1.00 mg/dL Final  06/22/2019 03:18 PM 1.82 (H) 0.44 - 1.00 mg/dL Final  06/16/2019 04:11 AM 1.70 (H) 0.44 - 1.00 mg/dL Final  06/15/2019  05:04 AM 1.59 (H) 0.44 - 1.00 mg/dL Final  06/14/2019 04:49 AM 1.57 (H) 0.44 - 1.00 mg/dL Final  06/13/2019 04:03 AM 1.79 (H) 0.44 - 1.00 mg/dL Final  06/12/2019 01:58 AM 1.72 (H) 0.44 - 1.00 mg/dL Final  06/11/2019 03:34 AM 1.63 (H) 0.44 - 1.00 mg/dL Final  06/10/2019 04:41 AM 1.71 (H) 0.44 - 1.00 mg/dL Final  06/09/2019 04:48 AM 2.10 (H) 0.44 - 1.00 mg/dL Final  06/08/2019 02:54 AM 2.22 (H) 0.44 - 1.00 mg/dL Final  06/06/2019 09:06 PM 2.12 (H) 0.44 - 1.00 mg/dL Final  04/11/2019 11:25 AM 2.07 (H) 0.44 - 1.00 mg/dL Final  03/23/2019 06:31 AM 1.59 (H) 0.44 - 1.00 mg/dL Final  03/22/2019 06:52 PM 1.73 (H) 0.44 - 1.00 mg/dL Final  03/11/2019 06:36 PM 2.47 (H) 0.44 - 1.00 mg/dL Final  03/11/2019 05:52 AM 2.54 (H) 0.44 - 1.00 mg/dL Final  03/10/2019 08:08 AM 2.75 (H) 0.44 - 1.00 mg/dL Final  03/09/2019  06:11 AM 3.33 (H) 0.44 - 1.00 mg/dL Final  03/08/2019 03:18 PM 3.66 (H) 0.44 - 1.00 mg/dL Final  01/09/2019 07:34 AM 1.82 (H) 0.44 - 1.00 mg/dL Final  01/08/2019 10:23 AM 1.98 (H) 0.44 - 1.00 mg/dL Final  01/02/2019 06:09 AM 1.49 (H) 0.44 - 1.00 mg/dL Final  01/01/2019 05:20 AM 1.26 (H) 0.44 - 1.00 mg/dL Final  12/30/2018 06:12 AM 1.50 (H) 0.44 - 1.00 mg/dL Final  12/30/2018 02:37 AM 1.44 (H) 0.44 - 1.00 mg/dL Final     PMHx:   Past Medical History:  Diagnosis Date  . Acute respiratory failure with hypoxia ([place]) 11/01/2019  . Anxiety   . Arthritis   . Chest pain   . Chronic kidney disease   . Dyspnea   . Elevated troponin 12/13/2018  . Headache(784.0)   . Hyperlipidemia   . Hypertension   . Joint pain   . Leg pain   . Peripheral neuropathy   . Peripheral vascular disease [facility])     Past Surgical History:  Procedure Laterality Date  . ABDOMINAL ANGIOGRAM N/A 10/29/2011   Procedure: ABDOMINAL ANGIOGRAM;  Surgeon: Elam Dutch, MD;  Location: [facility] CATH LAB;  Service: Cardiovascular;  Laterality: N/A;  . BACK SURGERY    . FEMORAL-FEMORAL BYPASS GRAFT  08/31/2010  . FLEXIBLE SIGMOIDOSCOPY N/A 06/12/2019   Procedure: FLEXIBLE SIGMOIDOSCOPY;  Surgeon: Ladene Artist, MD;  Location[facility] ENDOSCOPY;  Service: Endoscopy;  Laterality: N/A;  Patient had little bit to eat this morning so this will be unsedated.  Marland Kitchen JOINT REPLACEMENT Right 2012   Hip  . LOWER EXTREMITY ANGIOGRAPHY  06/14/2017   Procedure: Lower Extremity Angiography;  Surgeon: Adrian Prows, MD;  Location: [facility] CV LAB;  Service: Cardiovascular;;  Bilateral limited angio performed  . RENAL ANGIOGRAPHY N/A 06/14/2017   Procedure: Renal Angiography;  Surgeon: Adrian Prows, MD;  Location: [place] CV LAB;  Service: Cardiovascular;  Laterality: N/A;  . TOTAL HIP ARTHROPLASTY     right    Family Hx:  Family History  Problem Relation Age of Onset  . Heart attack Mother   . Heart disease Mother   . Hyperlipidemia  Mother   . Hypertension Mother   . Heart attack Father   . Heart disease Father        Before age 60  . Hyperlipidemia Father   . Hypertension Father   . Cancer Sister        brain tumor  . Aneurysm Brother        brain  .  Colon cancer Neg Hx   . Liver cancer Neg Hx     Social History:  reports that she has been smoking cigarettes. She has a 27.00 pack-year smoking history. She has never used smokeless tobacco. She reports that she does not drink alcohol and does not use drugs.  Allergies: No Known Allergies  Medications: Prior to Admission medications   Medication Sig Start Date End Date Taking? Authorizing Provider  acetaminophen (TYLENOL) 325 MG tablet Take 2 tablets (650 mg total) by mouth every 6 (six) hours as needed for mild pain, moderate pain or fever (or Fever >/= 101). 06/16/19   Hongalgi, Lenis Dickinson, MD  albuterol (PROAIR HFA) 108 (90 Base) MCG/ACT inhaler Inhale 2 puffs into the lungs every 6 (six) hours as needed for wheezing or shortness of breath.    [provider]  amLODipine (NORVASC) 5 MG tablet Take 1.5 tablets (7.5 mg total) by mouth daily. 04/14/20   Troy Sine, MD  aspirin 81 MG EC tablet Take 1 tablet (81 mg total) by mouth daily. 01/09/19   Caren Griffins, MD  busPIRone (BUSPAR) 15 MG tablet Take 15 mg by mouth 3 (three) times daily as needed for anxiety. 02/28/19   [provider]  carvedilol (COREG) 12.5 MG tablet Take 1 tablet (12.5 mg total) by mouth 2 (two) times daily with a meal. 02/10/20   Little Ishikawa, MD  clopidogrel (PLAVIX) 75 MG tablet Take 1 tablet (75 mg total) by mouth daily. 01/09/19   Caren Griffins, MD  diclofenac Sodium (VOLTAREN) 1 % GEL Apply 2 g topically 4 (four) times daily. 01/15/20   [provider]  donepezil (ARICEPT) 5 MG tablet Take 5 mg by mouth daily.    [provider]  DULoxetine (CYMBALTA) 30 MG capsule Take 30 mg by mouth daily.    [provider]  feeding supplement,  ENSURE ENLIVE, (ENSURE ENLIVE) LIQD Take 237 mLs by mouth 2 (two) times daily between meals. 06/16/19   Hongalgi, Lenis Dickinson, MD  Ferrous Sulfate (IRON HIGH-POTENCY) 325 MG TABS Take 325 mg by mouth daily with breakfast. 06/30/19   Hongalgi, Lenis Dickinson, MD  furosemide (LASIX) 20 MG tablet Take 2 tablets (40 mg total) by mouth daily as needed for fluid (Fluid overload.  Ensure patient maintains euvolemia.  Also I will for hyperkalemia). 01/08/20   Allie Bossier, MD  gabapentin (NEURONTIN) 100 MG capsule Take 100 mg by mouth 3 (three) times daily. 10/25/19   [provider]  Multiple Vitamin (MULTIVITAMIN WITH MINERALS) TABS tablet Take 1 tablet by mouth daily. 06/17/19   Hongalgi, Lenis Dickinson, MD  nitroGLYCERIN (NITROSTAT) 0.4 MG SL tablet Place 1 tablet (0.4 mg total) under the tongue every 5 (five) minutes as needed for chest pain. 01/07/20   Allie Bossier, MD  pantoprazole (PROTONIX) 40 MG tablet Take 1 tablet (40 mg total) by mouth daily. 06/16/19   Hongalgi, Lenis Dickinson, MD  rosuvastatin (CRESTOR) 40 MG tablet Take 1 tablet (40 mg total) by mouth at bedtime. Patient taking differently: Take 40 mg by mouth daily.  06/16/19   Hongalgi, Lenis Dickinson, MD  sodium bicarbonate 650 MG tablet Take 2 tablets (1,300 mg total) by mouth 2 (two) times daily. 01/07/20   Allie Bossier, MD    I have reviewed the patient's current medications.  Labs:  Results for orders placed or performed during the hospital encounter of 06/02/20 (from the past 48 hour(s))  SARS Coronavirus 2 by RT PCR (  hospital order, performed in [facility] hospital lab) Nasopharyngeal Nasopharyngeal Swab     Status: Abnormal   Collection Time: 06/02/20  2:15 PM   Specimen: Nasopharyngeal Swab  Result Value Ref Range   SARS Coronavirus 2 POSITIVE (A) NEGATIVE    Comment: RESULT CALLED TO, READ BACK BY AND VERIFIED WITH: E,ADKINS @1604  06/02/20 EB (NOTE) SARS-CoV-2 target nucleic acids are DETECTED  SARS-CoV-2 RNA is generally detectable in upper  respiratory specimens  during the acute phase of infection.  Positive results are indicative  of the presence of the identified virus, but do not rule out bacterial infection or co-infection with other pathogens not detected by the test.  Clinical correlation with patient history and  other diagnostic information is necessary to determine patient infection status.  The expected result is negative.  Fact Sheet for Patients:   StrictlyIdeas.no   Fact Sheet for Healthcare Providers:   BankingDealers.co.za    This test is not yet approved or cleared by the [place] FDA and  has been authorized for detection and/or diagnosis of SARS-CoV-2 by FDA under an Emergency Use Authorization (EUA).  This EUA will remain in effect (meaning this test can be  used) for the duration of  the COVID-19 declaration under Section 564(b)(1) of the Act, 21 U.S.C. section 360-bbb-3(b)(1), unless the authorization is terminated or revoked sooner.  Performed at [facility], [place] [address] [place], [place] [address]   CBC with Differential     Status: Abnormal   Collection Time: 06/02/20  3:12 PM  Result Value Ref Range   WBC 15.3 (H) 4.0 - 10.5 K/uL   RBC 3.35 (L) 3.87 - 5.11 MIL/uL   Hemoglobin 9.7 (L) 12.0 - 15.0 g/dL   HCT 29.8 (L) 36 - 46 %   MCV 89.0 80.0 - 100.0 fL   MCH 29.0 26.0 - 34.0 pg   MCHC 32.6 30.0 - 36.0 g/dL   RDW 16.8 (H) 11.5 - 15.5 %   Platelets 404 (H) 150 - 400 K/uL   nRBC 0.4 (H) 0.0 - 0.2 %   Neutrophils Relative % 80 %   Neutro Abs 12.4 (H) 1.7 - 7.7 K/uL   Lymphocytes Relative 8 %   Lymphs Abs 1.2 0.7 - 4.0 K/uL   Monocytes Relative 7 %   Monocytes Absolute 1.0 0 - 1 K/uL   Eosinophils Relative 3 %   Eosinophils Absolute 0.4 0 - 0 K/uL   Basophils Relative 1 %   Basophils Absolute 0.1 0 - 0 K/uL   Immature Granulocytes 1 %   Abs Immature Granulocytes 0.18 (H) 0.00 - 0.07 K/uL    Comment: Performed at [facility] [address] [place], [place] [address]  Comprehensive metabolic panel     Status: Abnormal   Collection Time: 06/02/20  3:12 PM  Result Value Ref Range   Sodium 143 135 - 145 mmol/L   Potassium 4.8 3.5 - 5.1 mmol/L   Chloride 117 (H) 98 - 111 mmol/L   CO2 7 (L) 22 - 32 mmol/L   Glucose, Bld 93 70 - 99 mg/dL    Comment: Glucose reference range applies only to samples taken after fasting for at least 8 hours.   BUN 78 (H) 8 - 23 mg/dL   Creatinine, Ser 7.43 (H) 0.44 - 1.00 mg/dL   Calcium 9.3 8.9 - 10.3 mg/dL   Total Protein 7.2 6.5 - 8.1 g/dL   Albumin 3.2 (L) 3.5 - 5.0 g/dL  AST 18 15 - 41 U/L   ALT 12 0 - 44 U/L   Alkaline Phosphatase 107 38 - 126 U/L   Total Bilirubin 0.6 0.3 - 1.2 mg/dL   GFR calc non Af Amer 5 (L) >60 mL/min   GFR calc Af Amer 6 (L) >60 mL/min   Anion gap 19 (H) 5 - 15    Comment: Performed at [facility] [address] [place], [place] [address]  Lipase, blood     Status: Abnormal   Collection Time: 06/02/20  3:12 PM  Result Value Ref Range   Lipase 118 (H) 11 - 51 U/L    Comment: Performed at [facility], [place] [address] [place], [place] [address]  Troponin I (High Sensitivity)     Status: Abnormal   Collection Time: 06/02/20  3:12 PM  Result Value Ref Range   Troponin I (High Sensitivity) 41 (H) <18 ng/L    Comment: (NOTE) Elevated high sensitivity troponin I (hsTnI) values and significant  changes across serial measurements may suggest ACS but many other  chronic and acute conditions are known to elevate hsTnI results.  Refer to the "Links" section for chest pain algorithms and additional  guidance. Performed at [facility], [place] [address] [place], [place] [address]   TSH     Status: None   Collection Time: 06/02/20  3:12 PM  Result Value Ref Range   TSH 1.872 0.350 - 4.500 uIU/mL    Comment: Performed by a 3rd Generation assay with a functional sensitivity of <=0.01 uIU/mL. Performed at [place], [place] [address], [place], [place] [address]   Urinalysis, Routine w reflex microscopic Urine, Clean Catch     Status: Abnormal   Collection Time: 06/02/20  3:15 PM  Result Value Ref Range   Color, Urine YELLOW YELLOW   APPearance CLOUDY (A) CLEAR   Specific Gravity, Urine 1.013 1.005 - 1.030   pH 5.0 5.0 - 8.0   Glucose, UA NEGATIVE NEGATIVE mg/dL   Hgb urine dipstick MODERATE (A) NEGATIVE   Bilirubin Urine NEGATIVE NEGATIVE   Ketones, ur NEGATIVE NEGATIVE mg/dL   Protein, ur 100 (A) NEGATIVE mg/dL   Nitrite NEGATIVE NEGATIVE   Leukocytes,Ua LARGE (A) NEGATIVE   RBC / HPF 6-10 0 - 5 RBC/hpf   WBC, UA >50 (H) 0 - 5 WBC/hpf   Bacteria, UA MANY (A) NONE SEEN   Squamous Epithelial / LPF 0-5 0 - 5   WBC Clumps PRESENT    Non Squamous Epithelial 0-5 (A) NONE SEEN    Comment: Performed at [facility], [place] [address] [place], [place] [address]     ROS:  Review of systems not obtained due to patient factors.  Physical Exam: Vitals:   06/02/20 2104 06/02/20 2108  BP:    Pulse: 77   Resp: 20   Temp:    SpO2: 100% (!) 76%     Full physical exam not performed-  Exams performed by other MDs reviewed  Assessment/Plan: 79 year old BF with multiple medical problems including stage 4 CKD presenting with COVID, UTI and A on CRF 1.Renal- baseline stage 4 CKD with her nephrologist saying not a good candidate for dialysis due to frailty.  Now with A on CRF in the setting of COVID and UTI also history of many episodes of AKI.  I agree with supportive care-  Attempting to hydrate given tachycardia and no volume overload on exam.  No absolute indications for dialysis but with acidosis.  Hoping renal function will improve with conservative measures given her marginal dialysis candidacy.  To monitor UOP and labs 2. Hypertension/volume  - no edema and tachycardic. bolused and now getting LR at 125 per hour-   Monitor fluid status 3.  COVID pos-  Decadron and remdesivir with supportive care- is  DNR 4. UTI-- put on rocephin-  Culture pending  5. Metabolic acidosis  - has been an issue in the OP setting-  Worsened in her acute state-  Getting LR fluids   Louis Meckel 06/02/2020, 9:17 PM

## 2020-06-03 ENCOUNTER — Inpatient Hospital Stay (HOSPITAL_COMMUNITY): Payer: Medicare HMO

## 2020-06-03 DIAGNOSIS — N179 Acute kidney failure, unspecified: Secondary | ICD-10-CM

## 2020-06-03 DIAGNOSIS — N3000 Acute cystitis without hematuria: Secondary | ICD-10-CM

## 2020-06-03 DIAGNOSIS — N1832 Chronic kidney disease, stage 3b: Secondary | ICD-10-CM

## 2020-06-03 DIAGNOSIS — G894 Chronic pain syndrome: Secondary | ICD-10-CM

## 2020-06-03 DIAGNOSIS — J9601 Acute respiratory failure with hypoxia: Secondary | ICD-10-CM

## 2020-06-03 LAB — PHOSPHORUS: Phosphorus: 7.2 mg/dL — ABNORMAL HIGH (ref 2.5–4.6)

## 2020-06-03 LAB — BASIC METABOLIC PANEL
Anion gap: 21 — ABNORMAL HIGH (ref 5–15)
Anion gap: 19 — ABNORMAL HIGH (ref 5–15)
BUN: 75 mg/dL — ABNORMAL HIGH (ref 8–23)
BUN: 76 mg/dL — ABNORMAL HIGH (ref 8–23)
CO2: 7 mmol/L — ABNORMAL LOW (ref 22–32)
CO2: 11 mmol/L — ABNORMAL LOW (ref 22–32)
Calcium: 8.4 mg/dL — ABNORMAL LOW (ref 8.9–10.3)
Calcium: 8.5 mg/dL — ABNORMAL LOW (ref 8.9–10.3)
Chloride: 114 mmol/L — ABNORMAL HIGH (ref 98–111)
Chloride: 112 mmol/L — ABNORMAL HIGH (ref 98–111)
Creatinine, Ser: 5.32 mg/dL — ABNORMAL HIGH (ref 0.44–1.00)
Creatinine, Ser: 5.42 mg/dL — ABNORMAL HIGH (ref 0.44–1.00)
GFR calc Af Amer: 8 mL/min — ABNORMAL LOW (ref >60)
GFR calc Af Amer: 8 mL/min — ABNORMAL LOW (ref >60)
GFR calc non Af Amer: 7 mL/min — ABNORMAL LOW (ref >60)
GFR calc non Af Amer: 7 mL/min — ABNORMAL LOW (ref >60)
Glucose, Bld: 225 mg/dL — ABNORMAL HIGH (ref 70–99)
Glucose, Bld: 195 mg/dL — ABNORMAL HIGH (ref 70–99)
Potassium: 3.9 mmol/L (ref 3.5–5.1)
Potassium: 3.7 mmol/L (ref 3.5–5.1)
Sodium: 142 mmol/L (ref 135–145)
Sodium: 142 mmol/L (ref 135–145)

## 2020-06-03 LAB — CBC WITH DIFFERENTIAL/PLATELET
Abs Immature Granulocytes: 0.22 10*3/uL — ABNORMAL HIGH (ref 0.00–0.07)
Basophils Absolute: 0.1 10*3/uL (ref 0.0–0.1)
Basophils Relative: 1 %
Eosinophils Absolute: 0 10*3/uL (ref 0.0–0.5)
Eosinophils Relative: 0 %
HCT: 26.4 % — ABNORMAL LOW (ref 36.0–46.0)
Hemoglobin: 8.6 g/dL — ABNORMAL LOW (ref 12.0–15.0)
Immature Granulocytes: 2 %
Lymphocytes Relative: 6 %
Lymphs Abs: 0.9 10*3/uL (ref 0.7–4.0)
MCH: 28.9 pg (ref 26.0–34.0)
MCHC: 32.6 g/dL (ref 30.0–36.0)
MCV: 88.6 fL (ref 80.0–100.0)
Monocytes Absolute: 0.2 10*3/uL (ref 0.1–1.0)
Monocytes Relative: 1 %
Neutro Abs: 13 10*3/uL — ABNORMAL HIGH (ref 1.7–7.7)
Neutrophils Relative %: 90 %
Platelets: 396 10*3/uL (ref 150–400)
RBC: 2.98 MIL/uL — ABNORMAL LOW (ref 3.87–5.11)
RDW: 17.1 % — ABNORMAL HIGH (ref 11.5–15.5)
WBC: 14.4 10*3/uL — ABNORMAL HIGH (ref 4.0–10.5)
nRBC: 0.3 % — ABNORMAL HIGH (ref 0.0–0.2)

## 2020-06-03 LAB — I-STAT VENOUS BLOOD GAS, ED
Acid-base deficit: 14 mmol/L — ABNORMAL HIGH (ref 0.0–2.0)
Bicarbonate: 10.4 mmol/L — ABNORMAL LOW (ref 20.0–28.0)
Calcium, Ion: 1.13 mmol/L — ABNORMAL LOW (ref 1.15–1.40)
HCT: 24 % — ABNORMAL LOW (ref 36.0–46.0)
Hemoglobin: 8.2 g/dL — ABNORMAL LOW (ref 12.0–15.0)
O2 Saturation: 79 %
Potassium: 3.7 mmol/L (ref 3.5–5.1)
Sodium: 142 mmol/L (ref 135–145)
TCO2: 11 mmol/L — ABNORMAL LOW (ref 22–32)
pCO2, Ven: 20.1 mmHg — ABNORMAL LOW (ref 44.0–60.0)
pH, Ven: 7.321 (ref 7.250–7.430)
pO2, Ven: 46 mmHg — ABNORMAL HIGH (ref 32.0–45.0)

## 2020-06-03 LAB — COMPREHENSIVE METABOLIC PANEL
ALT: 14 U/L (ref 0–44)
AST: 25 U/L (ref 15–41)
Albumin: 2.9 g/dL — ABNORMAL LOW (ref 3.5–5.0)
Alkaline Phosphatase: 98 U/L (ref 38–126)
BUN: 77 mg/dL — ABNORMAL HIGH (ref 8–23)
CO2: <7 mmol/L — ABNORMAL LOW (ref 22–32)
Calcium: 8.8 mg/dL — ABNORMAL LOW (ref 8.9–10.3)
Chloride: 119 mmol/L — ABNORMAL HIGH (ref 98–111)
Creatinine, Ser: 6.27 mg/dL — ABNORMAL HIGH (ref 0.44–1.00)
GFR calc Af Amer: 7 mL/min — ABNORMAL LOW (ref >60)
GFR calc non Af Amer: 6 mL/min — ABNORMAL LOW (ref >60)
Glucose, Bld: 223 mg/dL — ABNORMAL HIGH (ref 70–99)
Potassium: 4.7 mmol/L (ref 3.5–5.1)
Sodium: 144 mmol/L (ref 135–145)
Total Bilirubin: 0.5 mg/dL (ref 0.3–1.2)
Total Protein: 6.8 g/dL (ref 6.5–8.1)

## 2020-06-03 LAB — PROCALCITONIN: Procalcitonin: 1.12 ng/mL

## 2020-06-03 LAB — MAGNESIUM: Magnesium: 1.8 mg/dL (ref 1.7–2.4)

## 2020-06-03 LAB — C-REACTIVE PROTEIN: CRP: 9.8 mg/dL — ABNORMAL HIGH (ref <1.0)

## 2020-06-03 LAB — FERRITIN: Ferritin: 1642 ng/mL — ABNORMAL HIGH (ref 11–307)

## 2020-06-03 LAB — D-DIMER, QUANTITATIVE: D-Dimer, Quant: 5.77 ug/mL-FEU — ABNORMAL HIGH (ref 0.00–0.50)

## 2020-06-03 MED ORDER — METHYLPREDNISOLONE SODIUM SUCC 40 MG IJ SOLR
40.0000 mg | Freq: Two times a day (BID) | INTRAMUSCULAR | Status: DC
  Administered 2020-06-03 – 2020-06-06 (×6): 40 mg via INTRAVENOUS
  Filled 2020-06-03 (×6): qty 1

## 2020-06-03 MED ORDER — SODIUM BICARBONATE-DEXTROSE 150-5 MEQ/L-% IV SOLN
150.0000 meq | INTRAVENOUS | Status: AC
  Administered 2020-06-03: 150 meq via INTRAVENOUS
  Filled 2020-06-03 (×2): qty 1000

## 2020-06-03 MED ORDER — METOPROLOL TARTRATE 5 MG/5ML IV SOLN
5.0000 mg | Freq: Four times a day (QID) | INTRAVENOUS | Status: DC | PRN
  Administered 2020-06-03: 5 mg via INTRAVENOUS
  Filled 2020-06-03: qty 5

## 2020-06-03 MED ORDER — TECHNETIUM TO 99M ALBUMIN AGGREGATED
4.2000 | Freq: Once | INTRAVENOUS | Status: AC | PRN
  Administered 2020-06-03: 4.2 via INTRAVENOUS

## 2020-06-03 MED ORDER — HEPARIN SODIUM (PORCINE) 5000 UNIT/ML IJ SOLN
7500.0000 [IU] | Freq: Three times a day (TID) | INTRAMUSCULAR | Status: DC
  Administered 2020-06-03 – 2020-06-04 (×4): 7500 [IU] via SUBCUTANEOUS
  Filled 2020-06-03 (×4): qty 2

## 2020-06-03 MED ORDER — HYDRALAZINE HCL 20 MG/ML IJ SOLN: 5.0000 mg | Freq: Four times a day (QID) | INTRAMUSCULAR | Status: DC | PRN

## 2020-06-03 NOTE — ED Notes (Signed)
ADMITTING PAGED TO RN PER HER REQUEST

## 2020-06-03 NOTE — ED Notes (Signed)
Dinner ordered 

## 2020-06-03 NOTE — Progress Notes (Signed)
Nephrology Follow-Up Consult note   Assessment/Recommendations: Alyssa [name] is a/an 79 y.o. female with a past medical history significant for chronic kidney disease, hypertension presenting with Covid, UTI, AKI on CKD  AKI on CKD: Likely secondary to dehydration, possible tubular injury, urinary tract infection.  Baseline kidney function fluctuates but likely 2.2-3.  Creatinine 7.4 on arrival now improved to 6.3 with hydration.  Patient is feeling better. -IV fluids with bicarbonate as below -Continue to monitor daily Cr, Dose meds for GFR -Monitor Daily I/Os, Daily weight  -Maintain MAP>65 for optimal renal perfusion.  -Avoid nephrotoxic medications including NSAIDs and Vanc/Zosyn combo -No signs of urinary obstruction on CT abdomen pelvis yesterday -No definitive acute indication for dialysis at this time but monitor acidosis closely  Severe anion gap and non-anion gap metabolic acidosis: Likely associated with renal failure.  Bicarbonate less than 7 today. -Start IV sodium bicarb 125 cc/h -Recheck BMP, VBG, lactate -We will consider dialysis if the patient does not improve  Volume Status: Appears slightly dehydrated on exam.  Because of dehydration recommendations for fluids are included below.  Urinary tract infection: Antibiotics per primary team.    Covid positive: Has received vaccination.  Hopefully will be a relatively benign course.  Management per primary team   Recommendations conveyed to primary service.    [facility] 06/03/2020 1:28 PM  ___________________________________________________________  CC: AKI on CKD  Interval History/Subjective: Patient states that she feels a little bit better today.  She states that her shortness of breath is not as severe.  She endorses a lot of chills particular this morning.  She has some mild nausea otherwise feels pretty good.  Serum bicarbonate lower today.  Creatinine slightly improved  to 6.3.  Patient has some urine output   Medications:  Current Facility-Administered Medications  Medication Dose Route Frequency Provider Last Rate Last Admin  . acetaminophen (TYLENOL) tablet 650 mg  650 mg Oral Q6H PRN Garba, Mohammad L, MD      . albuterol (VENTOLIN HFA) 108 (90 Base) MCG/ACT inhaler 2 puff  2 puff Inhalation Q6H PRN Jonelle Sidle, Mohammad L, MD      . amLODipine (NORVASC) tablet 7.5 mg  7.5 mg Oral Daily Jonelle Sidle, Mohammad L, MD   7.5 mg at 06/03/20 1145  . aspirin EC tablet 81 mg  81 mg Oral Daily Gala Romney L, MD   81 mg at 06/03/20 1145  . azithromycin (ZITHROMAX) 500 mg in sodium chloride 0.9 % 250 mL IVPB  500 mg Intravenous Q24H Elwyn Reach, MD   Stopped at 06/02/20 2338  . busPIRone (BUSPAR) tablet 15 mg  15 mg Oral TID PRN Gala Romney L, MD      . carvedilol (COREG) tablet 12.5 mg  12.5 mg Oral BID WC Jonelle Sidle, Mohammad L, MD   12.5 mg at 06/03/20 1147  . cefTRIAXone (ROCEPHIN) 1 g in sodium chloride 0.9 % 100 mL IVPB  1 g Intravenous Q24H Elwyn Reach, MD   Stopped at 06/02/20 2227  . chlorpheniramine-HYDROcodone (TUSSIONEX) 10-8 MG/5ML suspension 5 mL  5 mL Oral Q12H PRN Gala Romney L, MD      . clopidogrel (PLAVIX) tablet 75 mg  75 mg Oral Daily Gala Romney L, MD   75 mg at 06/03/20 1147  . diclofenac Sodium (VOLTAREN) 1 % topical gel 2 g  2 g Topical TID AC & HS Elwyn Reach, MD   2 g at 06/03/20 1149  . donepezil (ARICEPT) tablet  5 mg  5 mg Oral Daily Gala Romney L, MD   5 mg at 06/03/20 1147  . DULoxetine (CYMBALTA) DR capsule 30 mg  30 mg Oral Daily Gala Romney L, MD   30 mg at 06/03/20 1148  . feeding supplement (ENSURE ENLIVE) (ENSURE ENLIVE) liquid 237 mL  237 mL Oral BID BM Garba, Mohammad L, MD      . ferrous sulfate tablet 325 mg  325 mg Oral Q breakfast Garba, Mohammad L, MD   325 mg at 06/03/20 1146  . furosemide (LASIX) tablet 40 mg  40 mg Oral Daily PRN Gala Romney L, MD      . gabapentin (NEURONTIN) capsule 100 mg   100 mg Oral TID Elwyn Reach, MD   100 mg at 06/02/20 2350  . guaiFENesin-dextromethorphan (ROBITUSSIN DM) 100-10 MG/5ML syrup 10 mL  10 mL Oral Q4H PRN Garba, Mohammad L, MD      . heparin injection 7,500 Units  7,500 Units Subcutaneous Q8H Elgergawy, Dawood S, MD      . hydrALAZINE (APRESOLINE) injection 5 mg  5 mg Intravenous Q6H PRN Elgergawy, Silver Huguenin, MD      . Ipratropium-Albuterol (COMBIVENT) respimat 1 puff  1 puff Inhalation Q6H Gala Romney L, MD   1 puff at 06/03/20 1200  . methylPREDNISolone sodium succinate (SOLU-MEDROL) 40 mg/mL injection 40 mg  40 mg Intravenous Q12H Elgergawy, Dawood S, MD      . metoprolol tartrate (LOPRESSOR) injection 5 mg  5 mg Intravenous Q6H PRN Elwyn Reach, MD   5 mg at 06/03/20 0051  . multivitamin with minerals tablet 1 tablet  1 tablet Oral Daily Garba, Mohammad L, MD      . nitroGLYCERIN (NITROSTAT) SL tablet 0.4 mg  0.4 mg Sublingual Q5 min PRN Gala Romney L, MD      . ondansetron (ZOFRAN) tablet 4 mg  4 mg Oral Q6H PRN Elwyn Reach, MD       Or  . ondansetron (ZOFRAN) injection 4 mg  4 mg Intravenous Q6H PRN Elwyn Reach, MD      . pantoprazole (PROTONIX) EC tablet 40 mg  40 mg Oral Daily Gala Romney L, MD      . remdesivir 100 mg in sodium chloride 0.9 % 100 mL IVPB  100 mg Intravenous Q24H Rumbarger, Valeda Malm, RPH   Stopped at 06/03/20 1304  . rosuvastatin (CRESTOR) tablet 40 mg  40 mg Oral Daily Gala Romney L, MD   40 mg at 06/03/20 1146  . sodium bicarbonate 150 mEq in dextrose 5% 1000 mL infusion  150 mEq Intravenous Continuous Reesa Chew, MD 125 mL/hr at 06/03/20 0905 150 mEq at 06/03/20 0905  . sodium bicarbonate tablet 1,300 mg  1,300 mg Oral BID Elwyn Reach, MD   1,300 mg at 06/03/20 1144   Current Outpatient Medications  Medication Sig Dispense Refill  . acetaminophen (TYLENOL) 325 MG tablet Take 2 tablets (650 mg total) by mouth every 6 (six) hours as needed for mild pain, moderate pain or  fever (or Fever >/= 101).    Marland Kitchen albuterol (PROAIR HFA) 108 (90 Base) MCG/ACT inhaler Inhale 2 puffs into the lungs every 6 (six) hours as needed for wheezing or shortness of breath.    Marland Kitchen amLODipine (NORVASC) 5 MG tablet Take 1.5 tablets (7.5 mg total) by mouth daily. 30 tablet 0  . aspirin 81 MG EC tablet Take 1 tablet (81 mg total) by mouth daily. 30 tablet 1  .  busPIRone (BUSPAR) 15 MG tablet Take 15 mg by mouth 3 (three) times daily as needed for anxiety.    . carvedilol (COREG) 12.5 MG tablet Take 1 tablet (12.5 mg total) by mouth 2 (two) times daily with a meal. 60 tablet 0  . clopidogrel (PLAVIX) 75 MG tablet Take 1 tablet (75 mg total) by mouth daily. 30 tablet 1  . diclofenac Sodium (VOLTAREN) 1 % GEL Apply 2 g topically 4 (four) times daily.    Marland Kitchen donepezil (ARICEPT) 5 MG tablet Take 5 mg by mouth daily.    . DULoxetine (CYMBALTA) 30 MG capsule Take 30 mg by mouth daily.    . feeding supplement, ENSURE ENLIVE, (ENSURE ENLIVE) LIQD Take 237 mLs by mouth 2 (two) times daily between meals. 237 mL 12  . Ferrous Sulfate (IRON HIGH-POTENCY) 325 MG TABS Take 325 mg by mouth daily with breakfast.    . furosemide (LASIX) 20 MG tablet Take 2 tablets (40 mg total) by mouth daily as needed for fluid (Fluid overload.  Ensure patient maintains euvolemia.  Also I will for hyperkalemia). 30 tablet 0  . gabapentin (NEURONTIN) 100 MG capsule Take 100 mg by mouth 3 (three) times daily.    . Multiple Vitamin (MULTIVITAMIN WITH MINERALS) TABS tablet Take 1 tablet by mouth daily.    . nitroGLYCERIN (NITROSTAT) 0.4 MG SL tablet Place 1 tablet (0.4 mg total) under the tongue every 5 (five) minutes as needed for chest pain. 30 tablet 0  . pantoprazole (PROTONIX) 40 MG tablet Take 1 tablet (40 mg total) by mouth daily. 30 tablet 0  . rosuvastatin (CRESTOR) 40 MG tablet Take 1 tablet (40 mg total) by mouth at bedtime. (Patient taking differently: Take 40 mg by mouth daily. )    . sodium bicarbonate 650 MG tablet Take  2 tablets (1,300 mg total) by mouth 2 (two) times daily. 60 tablet 0      Review of Systems: 10 systems reviewed and negative except per interval history/subjective  Physical Exam: Vitals:   06/03/20 1259 06/03/20 1314  BP: (!) 164/101 (!) 154/115  Pulse: (!) 103 (!) 105  Resp: 12 (!) 23  Temp:    SpO2: 100% 93%   Total I/O In: 1100 [I.V.:1000; IV Piggyback:100] Out: -   Intake/Output Summary (Last 24 hours) at 06/03/2020 1328 Last data filed at 06/03/2020 1304 Gross per 24 hour  Intake 2100 ml  Output --  Net 2100 ml   Constitutional: Ill-appearing, shaking, lying in bed ENMT: ears and nose without scars or lesions, MMM CV: Tachycardia, mild pitting edema in the lower extremities Respiratory: Bilateral chest rise, no increased work of breathing Gastrointestinal: soft, non-tender, no palpable masses or hernias Skin: no visible lesions or rashes Psych: alert, judgement/insight appropriate, appropriate mood and affect   Test Results I personally reviewed new and old clinical labs and radiology tests Lab Results  Component Value Date   NA 144 06/03/2020   K 4.7 06/03/2020   CL 119 (H) 06/03/2020   CO2 <7 (L) 06/03/2020   BUN 77 (H) 06/03/2020   CREATININE 6.27 (H) 06/03/2020   CALCIUM 8.8 (L) 06/03/2020   ALBUMIN 2.9 (L) 06/03/2020   PHOS 7.2 (H) 06/03/2020

## 2020-06-03 NOTE — ED Notes (Signed)
Pt transported to [facility] 

## 2020-06-03 NOTE — Progress Notes (Signed)
PROGRESS NOTE                                                                                                                                                                                                             Patient Demographics:    Alyssa [name], is a 79 y.o. female, DOB - Oct 20, 1940, KGU:[id number]  Admit date - 06/02/2020   Admitting Physician Elwyn Reach, MD  Outpatient Primary MD for the patient is [name], DO  LOS - 1   Chief Complaint  Patient presents with  . Shortness of Breath  . Loss of Consciousness       Brief Narrative    HPI: Alyssa [name] is a 79 y.o. female with medical history significant of dementia, chronic respiratory failure, followed by palliative care, chronic kidney disease stage IV, hypertension, hyperlipidemia, peripheral vascular disease, peripheral neuropathy who presented with shortness of breath over the last week.  Patient has had chronic pain and has been on home O2.  She started having chest pain and cough.  Some abdominal pain.  She is also lost appetite and has no oral intake.  Patient is not the best of historians but denied any known fever although she has some chills.  She has not been drinking also.  Patient found to have creatinine of more than 7.  Her previous creatinine was around 2-3.  Additionally she was found to be COVID-19 positive.  She is hypoxic but no obvious infiltrate on chest x-ray.  CT angiogram of the chest could not be done as patient has markedly elevated creatinine.  ED Course: Temperature 98.5 blood pressure 170s over 117 pulse 127 respirate of 35 oxygen sat 81% on 2 L.  Chemistry showed chloride of 117 BUN 78 creatinine 7.43 with sodium 143.  Gap of 19.  Albumin is 3.2 lipase 118.  White count 15.3 hemoglobin 9.7 platelets 404.  Urinalysis showed cloudy urine with large leukocytes many bacteria WBC more than 50.  TSH 1.872.  COVID-19 screen is positive.  Chest  x-ray showed no acute process.  CT abdomen pelvis shows sigmoid diverticulosis with large gastric hernia.  Patient being admitted with sepsis due to COVID-19 infection as well as UTI.   Subjective:    Nevin Bloodgood today reports generalized weakness, fatigue, chills, , as well she reports poor appetite /  Assessment  & Plan :    Principal Problem:   Acute respiratory failure due to COVID-19 [facility]) Active Problems:   Hyperlipidemia   Failure to thrive in adult   Chronic pain syndrome   Diverticulitis   Dementia (HCC)   Acute respiratory failure with hypoxia (HCC)   UTI (urinary tract infection)   Acute renal failure superimposed on chronic kidney disease (HCC)    Acute respiratory failure : -Think this would be multifactorial, in the setting of COPD exacerbation, possibly related to COVID-19 infection . -We will continue with the scheduled Combivent, will continue with steroids .  COPD exacerbation -Continue with steroids, Combivent and antibiotics.  COVID-19 infection -She is vaccinated, report she received Pfizer x2 few months ago, no evidence of pneumonia on her x-ray. -Given her frailty, and oxygen requirement, will continue with remdesivir. -Encouraged use incentive spirometry, out of bed to chair.  Elevated D-dimers -He denies any chest pain, will check venous Dopplers to rule out DVT, will check VQ scan to rule out PE, most likely elevated inflammatory markers in the setting of COVID-19 infection.  Especially with elevated CRP at 9.8, ferritin at 1, 642 -We will place on high-dose subcu heparin.  UTI:  - on IV Rocephin empirically.  Follow on urine cultures  Acute on chronic kidney disease:  -Mechanical elevated, management per renal, bicarb is <7, continue with bicarb drip, avoid nephrotoxic medications .  history of dementia  -Son denies patient having any dementia, but reports she had acute episodes of confusion when acutely ill and hospitalized  .  Chronic pain syndrome: Continue pain management  Failure to thrive: -Consult nutritionist, PT and OT  COVID-19 Labs  Recent Labs    06/03/20 0413  DDIMER 5.77*  FERRITIN 1,642*  CRP 9.8*    Lab Results  Component Value Date   SARSCOV2NAA POSITIVE (A) 06/02/2020   [place] NEGATIVE 01/06/2020   [place] NEGATIVE 01/02/2020   [place] NEGATIVE 11/01/2019     Code Status : Patient is full code, not DNR, I have confirmed this with both patient and son  Family Communication  : Discussed with son by phone  Disposition Plan  :  Status is: Inpatient  Remains inpatient appropriate because:Hemodynamically unstable and IV treatments appropriate due to intensity of illness or inability to take PO   Dispo: The patient is from: Home              Anticipated d/c is to: Home              Anticipated d/c date is: > 3 days              Patient currently is not medically stable to d/c.      Consults  :  renal  Procedures  : None  DVT Prophylaxis  :  [place] hesprin  Lab Results  Component Value Date   PLT 396 06/03/2020    Antibiotics  :   Anti-infectives (From admission, onward)   Start     Dose/Rate Route Frequency Ordered Stop   06/03/20 1000  remdesivir 100 mg in sodium chloride 0.9 % 100 mL IVPB  Status:  Discontinued       "Followed by" Linked Group Details   100 mg 200 mL/hr over 30 Minutes Intravenous Daily 06/02/20 1938 06/02/20 1954   06/03/20 1000  remdesivir 100 mg in sodium chloride 0.9 % 100 mL IVPB     Discontinue    "Followed by" Linked Group Details   100 mg  200 mL/hr over 30 Minutes Intravenous Every 24 hours 06/02/20 1827 06/07/20 0959   06/02/20 1945  cefTRIAXone (ROCEPHIN) 1 g in sodium chloride 0.9 % 100 mL IVPB     Discontinue     1 g 200 mL/hr over 30 Minutes Intravenous Every 24 hours 06/02/20 1938     06/02/20 1945  azithromycin (ZITHROMAX) 500 mg in sodium chloride 0.9 % 250 mL IVPB     Discontinue     500 mg 250 mL/hr over 60  Minutes Intravenous Every 24 hours 06/02/20 1938     06/02/20 1937  remdesivir 200 mg in sodium chloride 0.9% 250 mL IVPB  Status:  Discontinued       "Followed by" Linked Group Details   200 mg 580 mL/hr over 30 Minutes Intravenous Once 06/02/20 1938 06/02/20 1954   06/02/20 1930  remdesivir 200 mg in sodium chloride 0.9% 250 mL IVPB       "Followed by" Linked Group Details   200 mg 580 mL/hr over 30 Minutes Intravenous Once 06/02/20 1827 06/02/20 2119        Objective:   Vitals:   06/03/20 1114 06/03/20 1129 06/03/20 1144 06/03/20 1159  BP: (!) 168/112 (!) 159/135 (!) 175/113   Pulse:    97  Resp: 16 14 15 17   Temp:      TempSrc:      SpO2:    97%    Wt Readings from Last 3 Encounters:  04/14/20 44.5 kg  02/10/20 45 kg  01/07/20 44.8 kg     Intake/Output Summary (Last 24 hours) at 06/03/2020 1243 Last data filed at 06/03/2020 8182 Gross per 24 hour  Intake 2000 ml  Output --  Net 2000 ml     Physical Exam  Awake Alert, Oriented X 3, extremely frail, no new F.N deficits, Normal affect Symmetrical Chest wall movement, Good air movement bilaterally, CTAB RRR,No Gallops,Rubs or new Murmurs, No Parasternal Heave +ve B.Sounds, Abd Soft, No tenderness,No rebound - guarding or rigidity. No Cyanosis, Clubbing or edema, No new Rash or bruise      Data Review:    CBC Recent Labs  Lab 06/02/20 1512 06/02/20 1954 06/03/20 0413  WBC 15.3* 14.9* 14.4*  HGB 9.7* 8.9* 8.6*  HCT 29.8* 27.3* 26.4*  PLT 404* 382 396  MCV 89.0 88.9 88.6  MCH 29.0 29.0 28.9  MCHC 32.6 32.6 32.6  RDW 16.8* 16.8* 17.1*  LYMPHSABS 1.2  --  0.9  MONOABS 1.0  --  0.2  EOSABS 0.4  --  0.0  BASOSABS 0.1  --  0.1    Chemistries  Recent Labs  Lab 06/02/20 1512 06/02/20 1954 06/03/20 0413  NA 143  --  144  K 4.8  --  4.7  CL 117*  --  119*  CO2 7*  --  <7*  GLUCOSE 93  --  223*  BUN 78*  --  77*  CREATININE 7.43* 6.99* 6.27*  CALCIUM 9.3  --  8.8*  MG  --   --  1.8  AST 18  --   25  ALT 12  --  14  ALKPHOS 107  --  98  BILITOT 0.6  --  0.5   ------------------------------------------------------------------------------------------------------------------ No results for input(s): CHOL, HDL, LDLCALC, TRIG, CHOLHDL, LDLDIRECT in the last 72 hours.  Lab Results  Component Value Date   HGBA1C 6.5 (H) 09/19/2019   ------------------------------------------------------------------------------------------------------------------ Recent Labs    06/02/20 1512  TSH 1.872   ------------------------------------------------------------------------------------------------------------------ Recent Labs  06/03/20 0413  FERRITIN 1,642*    Coagulation profile No results for input(s): INR, PROTIME in the last 168 hours.  Recent Labs    06/03/20 0413  DDIMER 5.77*    Cardiac Enzymes No results for input(s): CKMB, TROPONINI, MYOGLOBIN in the last 168 hours.  Invalid input(s): CK ------------------------------------------------------------------------------------------------------------------    Component Value Date/Time   BNP 90.8 01/02/2020 1430    Inpatient Medications  Scheduled Meds: . amLODipine  7.5 mg Oral Daily  . aspirin EC  81 mg Oral Daily  . carvedilol  12.5 mg Oral BID WC  . clopidogrel  75 mg Oral Daily  . dexamethasone (DECADRON) injection  6 mg Intravenous Q24H  . diclofenac Sodium  2 g Topical TID AC & HS  . donepezil  5 mg Oral Daily  . DULoxetine  30 mg Oral Daily  . feeding supplement (ENSURE ENLIVE)  237 mL Oral BID BM  . ferrous sulfate  325 mg Oral Q breakfast  . gabapentin  100 mg Oral TID  . heparin  7,500 Units Subcutaneous Q8H  . Ipratropium-Albuterol  1 puff Inhalation Q6H  . multivitamin with minerals  1 tablet Oral Daily  . pantoprazole  40 mg Oral Daily  . rosuvastatin  40 mg Oral Daily  . sodium bicarbonate  1,300 mg Oral BID   Continuous Infusions: . azithromycin Stopped (06/02/20 2338)  . cefTRIAXone (ROCEPHIN)   IV Stopped (06/02/20 2227)  . remdesivir 100 mg in NS 100 mL 100 mg (06/03/20 1154)  . sodium bicarbonate 150 mEq in dextrose 5% 1000 mL 150 mEq (06/03/20 0905)   PRN Meds:.acetaminophen, albuterol, busPIRone, chlorpheniramine-HYDROcodone, furosemide, guaiFENesin-dextromethorphan, hydrALAZINE, metoprolol tartrate, nitroGLYCERIN, ondansetron **OR** ondansetron (ZOFRAN) IV  Micro Results Recent Results (from the past 240 hour(s))  SARS Coronavirus 2 by RT PCR (hospital order, performed in [place] hospital lab) Nasopharyngeal Nasopharyngeal Swab     Status: Abnormal   Collection Time: 06/02/20  2:15 PM   Specimen: Nasopharyngeal Swab  Result Value Ref Range Status   SARS Coronavirus 2 POSITIVE (A) NEGATIVE Final    Comment: RESULT CALLED TO, READ BACK BY AND VERIFIED WITH: E,ADKINS @1604  06/02/20 EB (NOTE) SARS-CoV-2 target nucleic acids are DETECTED  SARS-CoV-2 RNA is generally detectable in upper respiratory specimens  during the acute phase of infection.  Positive results are indicative  of the presence of the identified virus, but do not rule out bacterial infection or co-infection with other pathogens not detected by the test.  Clinical correlation with patient history and  other diagnostic information is necessary to determine patient infection status.  The expected result is negative.  Fact Sheet for Patients:   StrictlyIdeas.no   Fact Sheet for Healthcare Providers:   BankingDealers.co.za    This test is not yet approved or cleared by the [place] FDA and  has been authorized for detection and/or diagnosis of SARS-CoV-2 by FDA under an Emergency Use Authorization (EUA).  This EUA will remain in effect (meaning this test can be  used) for the duration of  the COVID-19 declaration under Section 564(b)(1) of the Act, 21 U.S.C. section 360-bbb-3(b)(1), unless the authorization is terminated or revoked sooner.  Performed  at [facility], [place] [address] [place], [place] [address]     Radiology Reports CT ABDOMEN PELVIS WO CONTRAST  Result Date: 06/02/2020 CLINICAL DATA:  Shortness of breath, chest pain and abdominal pain. EXAM: CT ABDOMEN AND PELVIS WITHOUT CONTRAST TECHNIQUE: Multidetector CT imaging of the abdomen and pelvis was performed  following the standard protocol without IV contrast. COMPARISON:  May 02, 2020 and November 01, 2019 FINDINGS: Lower chest: A cluster of 2 mm noncalcified lung nodules are seen within the posterolateral aspect of the right lung base. Hepatobiliary: No focal liver abnormality is seen. No gallstones, gallbladder wall thickening, or biliary dilatation. Pancreas: Unremarkable. No pancreatic ductal dilatation or surrounding inflammatory changes. Spleen: Normal in size without focal abnormality. Adrenals/Urinary Tract: Adrenal glands are unremarkable. Kidneys are normal, without renal calculi, focal lesion, or hydronephrosis. Bladder is unremarkable. Stomach/Bowel: There is a large gastric hernia. Appendix appears normal. No evidence of bowel dilatation. Noninflamed diverticula are seen throughout the sigmoid colon. Vascular/Lymphatic: There is marked severity calcification of the abdominal aorta and bilateral common iliac arteries, without evidence of aneurysmal dilatation. 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 A predominant stable 2.8 cm x 2.4 cm mildly lobulated area of heterogeneous low attenuation is seen within the region anterior to the right common femoral artery (axial CT image 58, CT series number 3). Reproductive: The uterus is mildly enlarged and heterogeneous in appearance. The bilateral adnexa are limited in evaluation secondary to streak artifact. Other: No abdominal wall hernia or abnormality. No abdominopelvic ascites. Musculoskeletal: A total right hip replacement is seen with extensive amount of associated streak artifact. Subsequently limited evaluation of the adjacent  osseous and soft tissue structures is noted. Bilateral metallic density pedicle screws are seen at the levels of L4 and L5. Metallic density operative material is seen along the L4-L5 intervertebral disc space. IMPRESSION: 1. Sigmoid diverticulosis without evidence of acute diverticulitis. 2. Postoperative changes within the lower lumbar spine. 3. Total right hip replacement. 4. Large gastric hernia. 5. Evidence of prior fem-fem bypass graft. 6. Predominant stable area of lobulated low-attenuation anterior to the expected region of the right common femoral artery. While this may be vascular in origin, the presence of a stable soft tissue mass cannot be excluded. Aortic Atherosclerosis (ICD10-I70.0). Electronically Signed   By: Virgina Norfolk M.D.   On: 06/02/2020 17:49   DG Chest Port 1 View  Result Date: 06/02/2020 CLINICAL DATA:  Shortness of breath, COVID positive contact EXAM: PORTABLE CHEST 1 VIEW COMPARISON:  03/14/2020 FINDINGS: No new consolidation or edema. Chronic interstitial prominence. No pleural effusion or pneumothorax. Stable cardiomediastinal contours with normal heart size. IMPRESSION: No acute process in the chest. Electronically Signed   By: Macy Mis M.D.   On: 06/02/2020 14:35   ECHOCARDIOGRAM COMPLETE  Result Date: 05/29/2020    ECHOCARDIOGRAM REPORT   Patient Name:   TONIQUE MENDONCA Date of Exam: 05/29/2020 Medical Rec #:  [id number]            Height:       63.0 in Accession #:    [id number]           Weight:       98.2 lb Date of Birth:  1941/08/06           BSA:          1.429 m Patient Age:    36 years             BP:           142/86 mmHg Patient Gender: F                    HR:           92 bpm. Exam Location:  [address] Procedure: 2D Echo, 3D Echo, Cardiac Doppler, Color Doppler and  Strain Analysis Indications:    I21.4 MI  History:        Patient has prior history of Echocardiogram examinations, most                 recent 01/03/2020. Previous Myocardial  Infarction, PVD,                 Signs/Symptoms:Syncope; Risk Factors:Current Smoker and                 Dyslipidemia. CKD stage 4. Anemia.  Sonographer:    Jessee Avers, RDCS Referring Phys: [place]  1. Left ventricular ejection fraction, by estimation, is 55 to 60%. The left ventricle has normal function. The left ventricle has no regional wall motion abnormalities. There is severe concentric left ventricular hypertrophy. Left ventricular diastolic  parameters are indeterminate. Elevated left ventricular end-diastolic pressure.  2. Right ventricular systolic function is normal. The right ventricular size is normal. Tricuspid regurgitation signal is inadequate for assessing PA pressure.  3. The mitral valve is grossly normal. Trivial mitral valve regurgitation. No evidence of mitral stenosis.  4. The aortic valve is tricuspid. Aortic valve regurgitation is not visualized. No aortic stenosis is present.  5. Aortic dilatation noted. There is borderline dilatation of the ascending aorta measuring 38 mm. Comparison(s): 01/03/20 EF 60-65%. FINDINGS  Left Ventricle: Basal lateral wall incompletely visualized, cannot exclude focal wall motion abnormality. Left ventricular ejection fraction, by estimation, is 55 to 60%. The left ventricle has normal function. The left ventricle has no regional wall motion abnormalities. Global longitudinal strain performed but not reported based on interpreter judgement due to suboptimal tracking. The left ventricular internal cavity size was normal in size. There is severe concentric left ventricular hypertrophy. Left ventricular diastolic parameters are indeterminate. Elevated left ventricular end-diastolic pressure. Right Ventricle: The right ventricular size is normal. No increase in right ventricular wall thickness. Right ventricular systolic function is normal. Tricuspid regurgitation signal is inadequate for assessing PA pressure. Left Atrium: Left atrial  size was normal in size. Right Atrium: Right atrial size was normal in size. Pericardium: There is no evidence of pericardial effusion. Mitral Valve: The mitral valve is grossly normal. There is mild thickening of the mitral valve leaflet(s). There is mild calcification of the mitral valve leaflet(s). Trivial mitral valve regurgitation. No evidence of mitral valve stenosis. Tricuspid Valve: The tricuspid valve is normal in structure. Tricuspid valve regurgitation is trivial. No evidence of tricuspid stenosis. Aortic Valve: The aortic valve is tricuspid. Aortic valve regurgitation is not visualized. No aortic stenosis is present. Pulmonic Valve: The pulmonic valve was not well visualized. Pulmonic valve regurgitation is trivial. No evidence of pulmonic stenosis. Aorta: Aortic dilatation noted. There is borderline dilatation of the ascending aorta measuring 38 mm. Venous: The inferior vena cava was not well visualized. IAS/Shunts: The atrial septum is grossly normal.  LEFT VENTRICLE PLAX 2D LVIDd:         3.30 cm  Diastology LVIDs:         2.30 cm  LV e' lateral:   5.78 cm/s LV PW:         1.50 cm  LV E/e' lateral: 10.8 LV IVS:        1.90 cm  LV e' medial:    2.40 cm/s LVOT diam:     2.00 cm  LV E/e' medial:  26.0 LV SV:         37 LV SV Index:   26  2D Longitudinal Strain LVOT Area:     3.14 cm 2D Strain GLS (A2C):   -15.7 %                         2D Strain GLS (A3C):   -10.0 %                         2D Strain GLS (A4C):   -10.1 %                         2D Strain GLS Avg:     -12.0 %                          3D Volume EF:                         3D EF:        58 %                         LV EDV:       76 ml                         LV ESV:       32 ml                         LV SV:        44 ml RIGHT VENTRICLE RV Basal diam:  2.60 cm RV S prime:     12.90 cm/s TAPSE (M-mode): 2.0 cm LEFT ATRIUM             Index       RIGHT ATRIUM           Index LA diam:        3.50 cm 2.45 cm/m  RA Pressure: 3.00 mmHg LA  Vol (A2C):   15.1 ml 10.57 ml/m RA Area:     6.29 cm LA Vol (A4C):   14.5 ml 10.15 ml/m RA Volume:   9.35 ml   6.54 ml/m LA Biplane Vol: 14.7 ml 10.29 ml/m  AORTIC VALVE LVOT Vmax:   77.50 cm/s LVOT Vmean:  49.100 cm/s LVOT VTI:    0.117 m  AORTA Ao Root diam: 2.90 cm Ao Asc diam:  3.80 cm MITRAL VALVE                TRICUSPID VALVE                             Estimated RAP:  3.00 mmHg  MV E velocity: 62.40 cm/s   SHUNTS MV A velocity: 117.00 cm/s  Systemic VTI:  0.12 m MV E/A ratio:  0.53         Systemic Diam: 2.00 cm Buford Dresser MD Electronically signed by Buford Dresser MD Signature Date/Time: 05/29/2020/4:38:39 PM    Final      Phillips Climes M.D on 06/03/2020 at 12:43 PM    [facility] -  Office  [phone]

## 2020-06-03 NOTE — ED Notes (Signed)
Ordered breakfast 

## 2020-06-04 ENCOUNTER — Inpatient Hospital Stay (HOSPITAL_COMMUNITY): Payer: Medicare HMO

## 2020-06-04 ENCOUNTER — Other Ambulatory Visit: Payer: Self-pay

## 2020-06-04 DIAGNOSIS — U071 COVID-19: Secondary | ICD-10-CM

## 2020-06-04 DIAGNOSIS — R7989 Other specified abnormal findings of blood chemistry: Secondary | ICD-10-CM | POA: Diagnosis not present

## 2020-06-04 LAB — COMPREHENSIVE METABOLIC PANEL
ALT: 13 U/L (ref 0–44)
AST: 21 U/L (ref 15–41)
Albumin: 2.5 g/dL — ABNORMAL LOW (ref 3.5–5.0)
Alkaline Phosphatase: 82 U/L (ref 38–126)
Anion gap: 14 (ref 5–15)
BUN: 81 mg/dL — ABNORMAL HIGH (ref 8–23)
CO2: 15 mmol/L — ABNORMAL LOW (ref 22–32)
Calcium: 8 mg/dL — ABNORMAL LOW (ref 8.9–10.3)
Chloride: 112 mmol/L — ABNORMAL HIGH (ref 98–111)
Creatinine, Ser: 4.74 mg/dL — ABNORMAL HIGH (ref 0.44–1.00)
GFR calc Af Amer: 10 mL/min — ABNORMAL LOW (ref >60)
GFR calc non Af Amer: 8 mL/min — ABNORMAL LOW (ref >60)
Glucose, Bld: 171 mg/dL — ABNORMAL HIGH (ref 70–99)
Potassium: 3.6 mmol/L (ref 3.5–5.1)
Sodium: 141 mmol/L (ref 135–145)
Total Bilirubin: 0.2 mg/dL — ABNORMAL LOW (ref 0.3–1.2)
Total Protein: 5.8 g/dL — ABNORMAL LOW (ref 6.5–8.1)

## 2020-06-04 LAB — URINE CULTURE: Culture: >=100000 — AB

## 2020-06-04 LAB — CBC WITH DIFFERENTIAL/PLATELET
Abs Immature Granulocytes: 0.15 10*3/uL — ABNORMAL HIGH (ref 0.00–0.07)
Basophils Absolute: 0 10*3/uL (ref 0.0–0.1)
Basophils Relative: 0 %
Eosinophils Absolute: 0 10*3/uL (ref 0.0–0.5)
Eosinophils Relative: 0 %
HCT: 21.6 % — ABNORMAL LOW (ref 36.0–46.0)
Hemoglobin: 7.6 g/dL — ABNORMAL LOW (ref 12.0–15.0)
Immature Granulocytes: 1 %
Lymphocytes Relative: 7 %
Lymphs Abs: 0.9 10*3/uL (ref 0.7–4.0)
MCH: 28.5 pg (ref 26.0–34.0)
MCHC: 35.2 g/dL (ref 30.0–36.0)
MCV: 80.9 fL (ref 80.0–100.0)
Monocytes Absolute: 0.2 10*3/uL (ref 0.1–1.0)
Monocytes Relative: 1 %
Neutro Abs: 11.5 10*3/uL — ABNORMAL HIGH (ref 1.7–7.7)
Neutrophils Relative %: 91 %
Platelets: 348 10*3/uL (ref 150–400)
RBC: 2.67 MIL/uL — ABNORMAL LOW (ref 3.87–5.11)
RDW: 15.2 % (ref 11.5–15.5)
WBC: 12.8 10*3/uL — ABNORMAL HIGH (ref 4.0–10.5)
nRBC: 0.6 % — ABNORMAL HIGH (ref 0.0–0.2)

## 2020-06-04 LAB — FERRITIN: Ferritin: 693 ng/mL — ABNORMAL HIGH (ref 11–307)

## 2020-06-04 LAB — D-DIMER, QUANTITATIVE: D-Dimer, Quant: 4.94 ug/mL-FEU — ABNORMAL HIGH (ref 0.00–0.50)

## 2020-06-04 LAB — C-REACTIVE PROTEIN: CRP: 6.6 mg/dL — ABNORMAL HIGH (ref <1.0)

## 2020-06-04 LAB — PHOSPHORUS: Phosphorus: 5.2 mg/dL — ABNORMAL HIGH (ref 2.5–4.6)

## 2020-06-04 LAB — MAGNESIUM: Magnesium: 1.6 mg/dL — ABNORMAL LOW (ref 1.7–2.4)

## 2020-06-04 MED ORDER — ORAL CARE MOUTH RINSE
15.0000 mL | Freq: Two times a day (BID) | OROMUCOSAL | Status: DC
  Administered 2020-06-04 – 2020-06-07 (×7): 15 mL via OROMUCOSAL

## 2020-06-04 MED ORDER — MAGNESIUM SULFATE 2 GM/50ML IV SOLN
2.0000 g | Freq: Once | INTRAVENOUS | Status: AC
  Administered 2020-06-04: 2 g via INTRAVENOUS
  Filled 2020-06-04: qty 50

## 2020-06-04 MED ORDER — CEPHALEXIN 500 MG PO CAPS
500.0000 mg | ORAL_CAPSULE | ORAL | Status: AC
  Administered 2020-06-04 – 2020-06-06 (×3): 500 mg via ORAL
  Filled 2020-06-04 (×3): qty 1

## 2020-06-04 MED ORDER — DICLOFENAC SODIUM 1 % EX GEL
2.0000 g | Freq: Three times a day (TID) | CUTANEOUS | Status: DC | PRN
  Administered 2020-06-04: 20:00:00 2 g via TOPICAL
  Filled 2020-06-04: qty 100

## 2020-06-04 MED ORDER — SODIUM BICARBONATE-DEXTROSE 150-5 MEQ/L-% IV SOLN
150.0000 meq | INTRAVENOUS | Status: AC
  Administered 2020-06-04: 150 meq via INTRAVENOUS
  Filled 2020-06-04 (×2): qty 1000

## 2020-06-04 MED ORDER — PROSOURCE PLUS PO LIQD
30.0000 mL | Freq: Two times a day (BID) | ORAL | Status: DC
  Administered 2020-06-04 – 2020-06-07 (×7): 30 mL via ORAL
  Filled 2020-06-04 (×8): qty 30

## 2020-06-04 MED ORDER — HEPARIN SODIUM (PORCINE) 5000 UNIT/ML IJ SOLN
5000.0000 [IU] | Freq: Three times a day (TID) | INTRAMUSCULAR | Status: DC
  Administered 2020-06-04 – 2020-06-07 (×7): 5000 [IU] via SUBCUTANEOUS
  Filled 2020-06-04 (×8): qty 1

## 2020-06-04 NOTE — Progress Notes (Addendum)
PROGRESS NOTE                                                                                                                                                                                                             Patient Demographics:    Alyssa [name], is a 79 y.o. female, DOB - 04-Feb-1941, TMA:[id number]  Outpatient Primary MD for the patient is [name], DO   Admit date - 06/02/2020   LOS - 2  Chief Complaint  Patient presents with  . Shortness of Breath  . Loss of Consciousness       Brief Narrative: Patient is a 79 y.o. female with PMHx of COPD, chronic pain syndrome, CKD stage IV, HTN, HLD-who presented to the hospital with chills, cough, poor appetite-she was found to have COVID-19 pneumonia and AKI.  She was subsequently admitted to the hospitalist service.  See below for further details.  COVID-19 vaccinated status: vaccinated  Significant Events: 8/16>> Admit to [facility] for AKI on CKD stage IV, COVID-19 infection.  Significant studies: 8/12>> Echo: EF 55-60% 8/16>>Chest x-ray: No acute process in the chest. 8/16>> CT abdomen/pelvis: Sigmoid diverticulosis, large gastric hernia-lobulated low-attenuation anterior to the expected region of the right common femoral artery-may be vascular in origin 8/17>> chest x-ray: Mild bibasilar infiltrates. 8/17>> normal perfusion scan without evidence of PE. 8/18>> bilateral lower extremity Doppler: No DVT-partial thrombosed pseudoaneurysm in the right groin.  COVID-19 medications: Steroids: 8/16>> Remdesivir: 8/16>>  Antibiotics: Rocephin: 8/16>> 8/17 Zithromax: 8/16>> 8/17 Keflex: 8/18>>  Microbiology data: 8/16>> urine culture: E. coli (resistant to ampicillin/Unasyn)  Procedures: None  Consults: None  DVT prophylaxis: heparin injection 5,000 Units Start: 06/04/20 2200    Subjective:    Alyssa [name] today has no complaints-she was on  1 L of oxygen-that was removed while I was in the room.   Assessment  & Plan :   Acute hypoxic Resp Failure due to COPD exacerbation and Covid 19 Viral pneumonia: Hypoxia has improved-COPD exacerbation improving-no wheezing today-remains on steroids/bronchodilators and Remdesivir.  Fever: afebrile O2 requirements:  SpO2: 98 % O2 Flow Rate (L/min): 2 L/min   COVID-19 Labs: Recent Labs    06/03/20 0413 06/04/20 0251  DDIMER 5.77* 4.94*  FERRITIN 1,642* 693*  CRP 9.8* 6.6*       Component Value Date/Time   BNP 90.8 01/02/2020 1430  Recent Labs  Lab 06/03/20 1235  PROCALCITON 1.12    Lab Results  Component Value Date   SARSCOV2NAA POSITIVE (A) 06/02/2020   [place] NEGATIVE 01/06/2020   [place] NEGATIVE 01/02/2020   [place] NEGATIVE 11/01/2019      Prone/Incentive Spirometry: encouraged  incentive spirometry use 3-4/hour  AKI on CKD stage IV: AKI hemodynamically mediated-slowly improving-continue supportive care-nephrology following.  E. coli UTI: Previously on Rocephin-transition to Keflex today.  HTN: BP stable-continue amlodipine  Chronic pain syndrome: Stable-continue Neurontin/Cymbalta.  History of PAD (history of femoropopliteal bypass)/carotid stenosis: Continue ASA/Plavix.  Dementia: Appears to be mild-on Aricept.  Partial thrombosed pseudoaneurysm in the right groin: Discussed with Dr. Donnetta Hutching over the phone-recommendations are for observation/follow-up in the outpatient setting-no intervention needed while inpatient.  Debility/deconditioning: Appears frail-debilitated-not sure what her baseline is-suspect could further worsening from acute illness-have ordered PT/OT.  Palliative care discussion: Full code.  Per son whom I discussed over the phone on 8/18-no longer being followed by hospice-full code-continue full scope of treatment.  Patient is not on home O2 per son.   ABG:    Component Value Date/Time   PHART 7.346 (L) 11/01/2019  0228   PCO2ART 17.6 (LL) 11/01/2019 0228   PO2ART 97.0 11/01/2019 0228   HCO3 10.4 (L) 06/03/2020 1421   TCO2 11 (L) 06/03/2020 1421   ACIDBASEDEF 14.0 (H) 06/03/2020 1421   O2SAT 79.0 06/03/2020 1421    Vent Settings: N/A  Condition - Guarded  Family Communication  : Son updated over the phone on 8/18  Code Status :  Full Code  Diet :  Diet Order            Diet Heart Room service appropriate? Yes; Fluid consistency: Thin  Diet effective now                  Disposition Plan  :   Status is: Inpatient  Remains inpatient appropriate because:Inpatient level of care appropriate due to severity of illness   Dispo: The patient is from: Home              Anticipated d/c is to: TBD              Anticipated d/c date is: 3 days              Patient currently is not medically stable to d/c.   Barriers to discharge: Hypoxia requiring O2 supplementation/complete 5 days of IV Remdesivir/AKI requiring IV fluids  Antimicorbials  :    Anti-infectives (From admission, onward)   Start     Dose/Rate Route Frequency Ordered Stop   06/04/20 1800  cephALEXin (KEFLEX) capsule 500 mg     Discontinue     500 mg Oral Every 24 hours 06/04/20 1408 06/07/20 1759   06/03/20 1000  remdesivir 100 mg in sodium chloride 0.9 % 100 mL IVPB  Status:  Discontinued       "Followed by" Linked Group Details   100 mg 200 mL/hr over 30 Minutes Intravenous Daily 06/02/20 1938 06/02/20 1954   06/03/20 1000  remdesivir 100 mg in sodium chloride 0.9 % 100 mL IVPB     Discontinue    "Followed by" Linked Group Details   100 mg 200 mL/hr over 30 Minutes Intravenous Every 24 hours 06/02/20 1827 06/07/20 0959   06/02/20 1945  cefTRIAXone (ROCEPHIN) 1 g in sodium chloride 0.9 % 100 mL IVPB  Status:  Discontinued        1 g 200 mL/hr over 30  Minutes Intravenous Every 24 hours 06/02/20 1938 06/04/20 1408   06/02/20 1945  azithromycin (ZITHROMAX) 500 mg in sodium chloride 0.9 % 250 mL IVPB  Status:  Discontinued         500 mg 250 mL/hr over 60 Minutes Intravenous Every 24 hours 06/02/20 1938 06/04/20 1408   06/02/20 1937  remdesivir 200 mg in sodium chloride 0.9% 250 mL IVPB  Status:  Discontinued       "Followed by" Linked Group Details   200 mg 580 mL/hr over 30 Minutes Intravenous Once 06/02/20 1938 06/02/20 1954   06/02/20 1930  remdesivir 200 mg in sodium chloride 0.9% 250 mL IVPB       "Followed by" Linked Group Details   200 mg 580 mL/hr over 30 Minutes Intravenous Once 06/02/20 1827 06/02/20 2119      Inpatient Medications  Scheduled Meds: . amLODipine  7.5 mg Oral Daily  . aspirin EC  81 mg Oral Daily  . carvedilol  12.5 mg Oral BID WC  . cephALEXin  500 mg Oral Q24H  . clopidogrel  75 mg Oral Daily  . donepezil  5 mg Oral Daily  . DULoxetine  30 mg Oral Daily  . feeding supplement (ENSURE ENLIVE)  237 mL Oral BID BM  . ferrous sulfate  325 mg Oral Q breakfast  . gabapentin  100 mg Oral TID  . heparin  5,000 Units Subcutaneous Q8H  . Ipratropium-Albuterol  1 puff Inhalation Q6H  . mouth rinse  15 mL Mouth Rinse BID  . methylPREDNISolone (SOLU-MEDROL) injection  40 mg Intravenous Q12H  . multivitamin with minerals  1 tablet Oral Daily  . pantoprazole  40 mg Oral Daily  . rosuvastatin  40 mg Oral Daily  . sodium bicarbonate  1,300 mg Oral BID   Continuous Infusions: . remdesivir 100 mg in NS 100 mL 100 mg (06/04/20 0953)  . sodium bicarbonate 150 mEq in dextrose 5% 1000 mL 150 mEq (06/04/20 0902)   PRN Meds:.acetaminophen, albuterol, busPIRone, chlorpheniramine-HYDROcodone, diclofenac Sodium, guaiFENesin-dextromethorphan, hydrALAZINE, metoprolol tartrate, nitroGLYCERIN, ondansetron **OR** ondansetron (ZOFRAN) IV   Time Spent in minutes  25  See all Orders from today for further details   Oren Binet M.D on 06/04/2020 at 3:35 PM  To page go to [email or web address] - use universal password  [facility] -  Office  [phone]    Objective:   Vitals:   06/04/20  0207 06/04/20 0547 06/04/20 0802 06/04/20 1404  BP: 121/89 109/73 (!) 121/95 113/78  Pulse: 86 85 88   Resp: 18 20 18 18   Temp: 98 F (36.7 C) 98.3 F (36.8 C) 98.3 F (36.8 C) 97.7 F (36.5 C)  TempSrc: Oral Oral Oral Oral  SpO2: 99% 100% 98%   Weight:      Height:        Wt Readings from Last 3 Encounters:  06/03/20 42.1 kg  04/14/20 44.5 kg  02/10/20 45 kg     Intake/Output Summary (Last 24 hours) at 06/04/2020 1535 Last data filed at 06/04/2020 1404 Gross per 24 hour  Intake 1590 ml  Output --  Net 1590 ml     Physical Exam Gen Exam:Alert awake-not in any distress-looks chronically sick appearing. HEENT:atraumatic, normocephalic Chest: B/L clear to auscultation anteriorly CVS:S1S2 regular Abdomen:soft non tender, non distended Extremities:no edema Neurology: Non focal-but has generalized weakness. Skin: no rash   Data Review:    CBC Recent Labs  Lab 06/02/20 1512 06/02/20 1954 06/03/20 0413 06/03/20 1421 06/04/20 0251  WBC  15.3* 14.9* 14.4*  --  12.8*  HGB 9.7* 8.9* 8.6* 8.2* 7.6*  HCT 29.8* 27.3* 26.4* 24.0* 21.6*  PLT 404* 382 396  --  348  MCV 89.0 88.9 88.6  --  80.9  MCH 29.0 29.0 28.9  --  28.5  MCHC 32.6 32.6 32.6  --  35.2  RDW 16.8* 16.8* 17.1*  --  15.2  LYMPHSABS 1.2  --  0.9  --  0.9  MONOABS 1.0  --  0.2  --  0.2  EOSABS 0.4  --  0.0  --  0.0  BASOSABS 0.1  --  0.1  --  0.0    Chemistries  Recent Labs  Lab 06/02/20 1512 06/02/20 1512 06/02/20 1954 06/03/20 0413 06/03/20 1235 06/03/20 1417 06/03/20 1421 06/04/20 0251  NA 143   < >  --  144 142 142 142 141  K 4.8   < >  --  4.7 3.9 3.7 3.7 3.6  CL 117*  --   --  119* 114* 112*  --  112*  CO2 7*  --   --  <7* 7* 11*  --  15*  GLUCOSE 93  --   --  223* 225* 195*  --  171*  BUN 78*  --   --  77* 75* 76*  --  81*  CREATININE 7.43*   < > 6.99* 6.27* 5.32* 5.42*  --  4.74*  CALCIUM 9.3  --   --  8.8* 8.4* 8.5*  --  8.0*  MG  --   --   --  1.8  --   --   --  1.6*  AST 18  --    --  25  --   --   --  21  ALT 12  --   --  14  --   --   --  13  ALKPHOS 107  --   --  98  --   --   --  82  BILITOT 0.6  --   --  0.5  --   --   --  0.2*   < > = values in this interval not displayed.   ------------------------------------------------------------------------------------------------------------------ No results for input(s): CHOL, HDL, LDLCALC, TRIG, CHOLHDL, LDLDIRECT in the last 72 hours.  Lab Results  Component Value Date   HGBA1C 6.5 (H) 09/19/2019   ------------------------------------------------------------------------------------------------------------------ Recent Labs    06/02/20 1512  TSH 1.872   ------------------------------------------------------------------------------------------------------------------ Recent Labs    06/03/20 0413 06/04/20 0251  FERRITIN 1,642* 693*    Coagulation profile No results for input(s): INR, PROTIME in the last 168 hours.  Recent Labs    06/03/20 0413 06/04/20 0251  DDIMER 5.77* 4.94*    Cardiac Enzymes No results for input(s): CKMB, TROPONINI, MYOGLOBIN in the last 168 hours.  Invalid input(s): CK ------------------------------------------------------------------------------------------------------------------    Component Value Date/Time   BNP 90.8 01/02/2020 1430    Micro Results Recent Results (from the past 240 hour(s))  SARS Coronavirus 2 by RT PCR (hospital order, performed in [facility] hospital lab) Nasopharyngeal Nasopharyngeal Swab     Status: Abnormal   Collection Time: 06/02/20  2:15 PM   Specimen: Nasopharyngeal Swab  Result Value Ref Range Status   SARS Coronavirus 2 POSITIVE (A) NEGATIVE Final    Comment: RESULT CALLED TO, READ BACK BY AND VERIFIED WITH: E,ADKINS @1604  06/02/20 EB (NOTE) SARS-CoV-2 target nucleic acids are DETECTED  SARS-CoV-2 RNA is generally detectable in upper respiratory specimens  during the acute  phase of infection.  Positive results are indicative  of  the presence of the identified virus, but do not rule out bacterial infection or co-infection with other pathogens not detected by the test.  Clinical correlation with patient history and  other diagnostic information is necessary to determine patient infection status.  The expected result is negative.  Fact Sheet for Patients:   StrictlyIdeas.no   Fact Sheet for Healthcare Providers:   BankingDealers.co.za    This test is not yet approved or cleared by the [place] FDA and  has been authorized for detection and/or diagnosis of SARS-CoV-2 by FDA under an Emergency Use Authorization (EUA).  This EUA will remain in effect (meaning this test can be  used) for the duration of  the COVID-19 declaration under Section 564(b)(1) of the Act, 21 U.S.C. section 360-bbb-3(b)(1), unless the authorization is terminated or revoked sooner.  Performed at [facility], [place] [address], [place], [place] [address]   Culture, Urine     Status: Abnormal   Collection Time: 06/02/20  9:15 PM   Specimen: Urine, Random  Result Value Ref Range Status   Specimen Description URINE, RANDOM  Final   Special Requests   Final    NONE Performed at [facility], [place] [address] [place],  [address]    Culture >=100,000 COLONIES/mL ESCHERICHIA COLI (A)  Final   Report Status 06/04/2020 FINAL  Final   Organism ID, Bacteria ESCHERICHIA COLI (A)  Final      Susceptibility   Escherichia coli - MIC*    AMPICILLIN >=32 RESISTANT Resistant     CEFAZOLIN <=4 SENSITIVE Sensitive     CEFTRIAXONE <=0.25 SENSITIVE Sensitive     CIPROFLOXACIN <=0.25 SENSITIVE Sensitive     GENTAMICIN <=1 SENSITIVE Sensitive     IMIPENEM <=0.25 SENSITIVE Sensitive     NITROFURANTOIN <=16 SENSITIVE Sensitive     TRIMETH/SULFA <=20 SENSITIVE Sensitive     AMPICILLIN/SULBACTAM >=32 RESISTANT Resistant     PIP/TAZO <=4 SENSITIVE Sensitive     * >=100,000 COLONIES/mL  ESCHERICHIA COLI    Radiology Reports CT ABDOMEN PELVIS WO CONTRAST  Result Date: 06/02/2020 CLINICAL DATA:  Shortness of breath, chest pain and abdominal pain. EXAM: CT ABDOMEN AND PELVIS WITHOUT CONTRAST TECHNIQUE: Multidetector CT imaging of the abdomen and pelvis was performed following the standard protocol without IV contrast. COMPARISON:  May 02, 2020 and November 01, 2019 FINDINGS: Lower chest: A cluster of 2 mm noncalcified lung nodules are seen within the posterolateral aspect of the right lung base. Hepatobiliary: No focal liver abnormality is seen. No gallstones, gallbladder wall thickening, or biliary dilatation. Pancreas: Unremarkable. No pancreatic ductal dilatation or surrounding inflammatory changes. Spleen: Normal in size without focal abnormality. Adrenals/Urinary Tract: Adrenal glands are unremarkable. Kidneys are normal, without renal calculi, focal lesion, or hydronephrosis. Bladder is unremarkable. Stomach/Bowel: There is a large gastric hernia. Appendix appears normal. No evidence of bowel dilatation. Noninflamed diverticula are seen throughout the sigmoid colon. Vascular/Lymphatic: There is marked severity calcification of the abdominal aorta and bilateral common iliac arteries, without evidence of aneurysmal dilatation. 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 A predominant stable 2.8 cm x 2.4 cm mildly lobulated area of heterogeneous low attenuation is seen within the region anterior to the right common femoral artery (axial CT image 58, CT series number 3). Reproductive: The uterus is mildly enlarged and heterogeneous in appearance. The bilateral adnexa are limited in evaluation secondary to streak artifact. Other: No abdominal wall hernia or abnormality.  No abdominopelvic ascites. Musculoskeletal: A total right hip replacement is seen with extensive amount of associated streak artifact. Subsequently limited evaluation of the adjacent osseous and soft tissue structures is noted. Bilateral  metallic density pedicle screws are seen at the levels of L4 and L5. Metallic density operative material is seen along the L4-L5 intervertebral disc space. IMPRESSION: 1. Sigmoid diverticulosis without evidence of acute diverticulitis. 2. Postoperative changes within the lower lumbar spine. 3. Total right hip replacement. 4. Large gastric hernia. 5. Evidence of prior fem-fem bypass graft. 6. Predominant stable area of lobulated low-attenuation anterior to the expected region of the right common femoral artery. While this may be vascular in origin, the presence of a stable soft tissue mass cannot be excluded. Aortic Atherosclerosis (ICD10-I70.0). Electronically Signed   By: Virgina Norfolk M.D.   On: 06/02/2020 17:49   NM Pulmonary Perfusion  Result Date: 06/03/2020 CLINICAL DATA:  COVID-19 positive EXAM: NUCLEAR MEDICINE PERFUSION LUNG SCAN TECHNIQUE: Perfusion images were obtained in multiple projections after intravenous injection of radiopharmaceutical. Ventilation scans intentionally deferred if perfusion scan and chest x-ray adequate for interpretation during COVID 19 epidemic. RADIOPHARMACEUTICALS:  4.2 mCi Tc-57m MAA IV COMPARISON:  Same day chest x-ray.  Perfusion scan 01/02/2020 FINDINGS: Physiologic distribution of radiotracer throughout both lungs. No perfusion defect. IMPRESSION: Normal perfusion scan without evidence for pulmonary embolism. Electronically Signed   By: Davina Poke D.O.   On: 06/03/2020 16:39   DG Chest Port 1 View  Result Date: 06/03/2020 CLINICAL DATA:  Elevated D-dimer. EXAM: PORTABLE CHEST 1 VIEW COMPARISON:  06/02/2020. FINDINGS: Mediastinum and hilar structures normal. Heart size normal. Mild bibasilar atelectasis/infiltrates. No pleural effusion or pneumothorax. Degenerative changes thoracic spine and both shoulders. No acute bony abnormality. IMPRESSION: Mild bibasilar atelectasis/infiltrates. Electronically Signed   By: Marcello Moores  Register   On: 06/03/2020 14:26    DG Chest Port 1 View  Result Date: 06/02/2020 CLINICAL DATA:  Shortness of breath, COVID positive contact EXAM: PORTABLE CHEST 1 VIEW COMPARISON:  03/14/2020 FINDINGS: No new consolidation or edema. Chronic interstitial prominence. No pleural effusion or pneumothorax. Stable cardiomediastinal contours with normal heart size. IMPRESSION: No acute process in the chest. Electronically Signed   By: Macy Mis M.D.   On: 06/02/2020 14:35   ECHOCARDIOGRAM COMPLETE  Result Date: 05/29/2020    ECHOCARDIOGRAM REPORT   Patient Name:   AMYAH CLAWSON Date of Exam: 05/29/2020 Medical Rec #:  [id number]            Height:       63.0 in Accession #:    [id number]           Weight:       98.2 lb Date of Birth:  [birth year]           BSA:          1.429 m Patient Age:    83 years             BP:           142/86 mmHg Patient Gender: F                    HR:           92 bpm. Exam Location:  [address] Procedure: 2D Echo, 3D Echo, Cardiac Doppler, Color Doppler and Strain Analysis Indications:    I21.4 MI  History:        Patient has prior history of Echocardiogram examinations, most  recent 01/03/2020. Previous Myocardial Infarction, PVD,                 Signs/Symptoms:Syncope; Risk Factors:Current Smoker and                 Dyslipidemia. CKD stage 4. Anemia.  Sonographer:    Jessee Avers, RDCS Referring Phys: [place]  1. Left ventricular ejection fraction, by estimation, is 55 to 60%. The left ventricle has normal function. The left ventricle has no regional wall motion abnormalities. There is severe concentric left ventricular hypertrophy. Left ventricular diastolic  parameters are indeterminate. Elevated left ventricular end-diastolic pressure.  2. Right ventricular systolic function is normal. The right ventricular size is normal. Tricuspid regurgitation signal is inadequate for assessing PA pressure.  3. The mitral valve is grossly normal. Trivial mitral valve  regurgitation. No evidence of mitral stenosis.  4. The aortic valve is tricuspid. Aortic valve regurgitation is not visualized. No aortic stenosis is present.  5. Aortic dilatation noted. There is borderline dilatation of the ascending aorta measuring 38 mm. Comparison(s): 01/03/20 EF 60-65%. FINDINGS  Left Ventricle: Basal lateral wall incompletely visualized, cannot exclude focal wall motion abnormality. Left ventricular ejection fraction, by estimation, is 55 to 60%. The left ventricle has normal function. The left ventricle has no regional wall motion abnormalities. Global longitudinal strain performed but not reported based on interpreter judgement due to suboptimal tracking. The left ventricular internal cavity size was normal in size. There is severe concentric left ventricular hypertrophy. Left ventricular diastolic parameters are indeterminate. Elevated left ventricular end-diastolic pressure. Right Ventricle: The right ventricular size is normal. No increase in right ventricular wall thickness. Right ventricular systolic function is normal. Tricuspid regurgitation signal is inadequate for assessing PA pressure. Left Atrium: Left atrial size was normal in size. Right Atrium: Right atrial size was normal in size. Pericardium: There is no evidence of pericardial effusion. Mitral Valve: The mitral valve is grossly normal. There is mild thickening of the mitral valve leaflet(s). There is mild calcification of the mitral valve leaflet(s). Trivial mitral valve regurgitation. No evidence of mitral valve stenosis. Tricuspid Valve: The tricuspid valve is normal in structure. Tricuspid valve regurgitation is trivial. No evidence of tricuspid stenosis. Aortic Valve: The aortic valve is tricuspid. Aortic valve regurgitation is not visualized. No aortic stenosis is present. Pulmonic Valve: The pulmonic valve was not well visualized. Pulmonic valve regurgitation is trivial. No evidence of pulmonic stenosis. Aorta: Aortic  dilatation noted. There is borderline dilatation of the ascending aorta measuring 38 mm. Venous: The inferior vena cava was not well visualized. IAS/Shunts: The atrial septum is grossly normal.  LEFT VENTRICLE PLAX 2D LVIDd:         3.30 cm  Diastology LVIDs:         2.30 cm  LV e' lateral:   5.78 cm/s LV PW:         1.50 cm  LV E/e' lateral: 10.8 LV IVS:        1.90 cm  LV e' medial:    2.40 cm/s LVOT diam:     2.00 cm  LV E/e' medial:  26.0 LV SV:         37 LV SV Index:   26       2D Longitudinal Strain LVOT Area:     3.14 cm 2D Strain GLS (A2C):   -15.7 %  2D Strain GLS (A3C):   -10.0 %                         2D Strain GLS (A4C):   -10.1 %                         2D Strain GLS Avg:     -12.0 %                          3D Volume EF:                         3D EF:        58 %                         LV EDV:       76 ml                         LV ESV:       32 ml                         LV SV:        44 ml RIGHT VENTRICLE RV Basal diam:  2.60 cm RV S prime:     12.90 cm/s TAPSE (M-mode): 2.0 cm LEFT ATRIUM             Index       RIGHT ATRIUM           Index LA diam:        3.50 cm 2.45 cm/m  RA Pressure: 3.00 mmHg LA Vol (A2C):   15.1 ml 10.57 ml/m RA Area:     6.29 cm LA Vol (A4C):   14.5 ml 10.15 ml/m RA Volume:   9.35 ml   6.54 ml/m LA Biplane Vol: 14.7 ml 10.29 ml/m  AORTIC VALVE LVOT Vmax:   77.50 cm/s LVOT Vmean:  49.100 cm/s LVOT VTI:    0.117 m  AORTA Ao Root diam: 2.90 cm Ao Asc diam:  3.80 cm MITRAL VALVE                TRICUSPID VALVE                             Estimated RAP:  3.00 mmHg  MV E velocity: 62.40 cm/s   SHUNTS MV A velocity: 117.00 cm/s  Systemic VTI:  0.12 m MV E/A ratio:  0.53         Systemic Diam: 2.00 cm Buford Dresser MD Electronically signed by Buford Dresser MD Signature Date/Time: 05/29/2020/4:38:39 PM    Final    VAS [place] LOWER EXTREMITY VENOUS (DVT)  Result Date: 06/04/2020  Lower Venous DVTStudy Other Indications: Covid positive with  elevated d-dimer. Comparison Study: 12/12/18 LE venous - negative Performing Technologist: Velva Harman Sturdivant RDMS, RVT  Examination Guidelines: A complete evaluation includes B-mode imaging, spectral Doppler, color Doppler, and power Doppler as needed of all accessible portions of each vessel. Bilateral testing is considered an integral part of a complete examination. Limited examinations for reoccurring indications may be performed as noted. The reflux portion of the exam is performed with the patient in reverse Trendelenburg.  +---------+---------------+---------+-----------+----------+--------------+ RIGHT  CompressibilityPhasicitySpontaneityPropertiesThrombus Aging +---------+---------------+---------+-----------+----------+--------------+ CFV      Full           Yes      Yes                                 +---------+---------------+---------+-----------+----------+--------------+ SFJ      Full                                                        +---------+---------------+---------+-----------+----------+--------------+ FV Prox  Full                                                        +---------+---------------+---------+-----------+----------+--------------+ FV Mid   Full                                                        +---------+---------------+---------+-----------+----------+--------------+ FV DistalFull                                                        +---------+---------------+---------+-----------+----------+--------------+ PFV      Full                                                        +---------+---------------+---------+-----------+----------+--------------+ POP      Full           Yes      Yes                                 +---------+---------------+---------+-----------+----------+--------------+ PTV      Full                                                         +---------+---------------+---------+-----------+----------+--------------+ PERO     Full                                                        +---------+---------------+---------+-----------+----------+--------------+   +---------+---------------+---------+-----------+----------+--------------+ LEFT     CompressibilityPhasicitySpontaneityPropertiesThrombus Aging +---------+---------------+---------+-----------+----------+--------------+ CFV      Full           Yes      Yes                                 +---------+---------------+---------+-----------+----------+--------------+  SFJ      Full                                                        +---------+---------------+---------+-----------+----------+--------------+ FV Prox  Full                                                        +---------+---------------+---------+-----------+----------+--------------+ FV Mid   Full                                                        +---------+---------------+---------+-----------+----------+--------------+ FV DistalFull                                                        +---------+---------------+---------+-----------+----------+--------------+ PFV      Full                                                        +---------+---------------+---------+-----------+----------+--------------+ POP      Full           Yes      Yes                                 +---------+---------------+---------+-----------+----------+--------------+ PTV      Full                                                        +---------+---------------+---------+-----------+----------+--------------+ PERO     Full                                                        +---------+---------------+---------+-----------+----------+--------------+     Summary: BILATERAL: - No evidence of deep vein thrombosis seen in the lower extremities, bilaterally. -No evidence of  popliteal cyst, bilaterally. 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   *See table(s) above for measurements and observations.    Preliminary

## 2020-06-04 NOTE — Progress Notes (Signed)
Ok to change SQ heparin to 5000 units q8 due to low weight and optimize ceftriaxone/azithromycin to keflex for UTI to complete 5d. Per Dr Nena Alexander.  Onnie Boer, PharmD, BCIDP, AAHIVP, CPP Infectious Disease Pharmacist 06/04/2020 2:12 PM

## 2020-06-04 NOTE — Progress Notes (Addendum)
Initial Nutrition Assessment  DOCUMENTATION CODES:   Non-severe (moderate) malnutrition in context of chronic illness, Underweight   INTERVENTION:  Continue Ensure Enlive po BID, each supplement provides 350 kcal and 20 grams of protein.  Provide 30 ml Prosource plus po BID, each supplement provides 100 kcal and 15 grams of protein.   Encourage adequate PO intake.   NUTRITION DIAGNOSIS:   Moderate Malnutrition related to chronic illness (CKD4, COPD) as evidenced by moderate fat depletion, moderate muscle depletion.  GOAL:   Patient will meet greater than or equal to 90% of their needs  MONITOR:   PO intake, Supplement acceptance, Skin, Weight trends, Labs, I & O's  REASON FOR ASSESSMENT:   Malnutrition Screening Tool    ASSESSMENT:   79 y.o. female with medical history significant of dementia, chronic respiratory failure, COPD, chronic kidney disease stage IV, hypertension, hyperlipidemia, peripheral vascular disease, peripheral neuropathy who presented with shortness of breath. Pt COVID positive.   Meal completion has been 25-40%. Pt reports she has been trying to consume more of her food at meals. Pt reports prior to admission her usual intake is 3 meals a day with at least 2 Ensure shakes daily. Usual body weight unknown. Pt currently has Ensure ordered and has been consuming them. RD to additionally order Prosource plus to aid in protein needs. Pt encouraged to eat her food at meals and to drink her supplements.   NUTRITION - FOCUSED PHYSICAL EXAM:    Most Recent Value  Orbital Region No depletion  Upper Arm Region Moderate depletion  Thoracic and Lumbar Region No depletion  Buccal Region Unable to assess  Temple Region Unable to assess  Clavicle Bone Region No depletion  Clavicle and Acromion Bone Region No depletion  Scapular Bone Region Unable to assess  Dorsal Hand Unable to assess  Patellar Region Mild depletion  Anterior Thigh Region Mild depletion   Posterior Calf Region Mild depletion  Edema (RD Assessment) None  Hair Reviewed  Eyes Reviewed  Mouth Reviewed  Skin Reviewed  Nails Reviewed     Labs and medications reviewed. Phosphorous elevated at 5.2. Magnesium low at 1.6.   Diet Order:   Diet Order            Diet Heart Room service appropriate? Yes; Fluid consistency: Thin  Diet effective now                 EDUCATION NEEDS:   Not appropriate for education at this time  Skin:  Skin Assessment: Reviewed RN Assessment  Last BM:  8/15  Height:   Ht Readings from Last 1 Encounters:  06/03/20 5\' 3"  (1.6 m)    Weight:   Wt Readings from Last 1 Encounters:  06/03/20 42.1 kg    Ideal Body Weight:  52.27 kg  BMI:  Body mass index is 16.44 kg/m.  Estimated Nutritional Needs:   Kcal:  1500-1700  Protein:  70-85 grams  Fluid:  >/= 1.5 L/day   Corrin Parker, MS, RD, LDN RD pager number/after hours weekend pager number on Amion.

## 2020-06-04 NOTE — Progress Notes (Signed)
Patient arrived to unit [facility]  bed 34 from emergency department. Assisted patient to bed by nursing staff.No acute distress noted at present time.Patient denies pain or discomfort at this time.Oriented patient to nursing unit and call bell system.Educated patient to call for help prior to getting out of bed and verbalized understanding.Patient call bell,phone,and personal items within reach, and bed alarm set for safety.

## 2020-06-04 NOTE — Progress Notes (Signed)
Nephrology Follow-Up Consult note   Assessment/Recommendations: Alyssa [name] is a/an 79 y.o. female with a past medical history significant for chronic kidney disease, hypertension presenting with Covid, UTI, AKI on CKD  AKI on CKD: Likely secondary to dehydration, possible tubular injury, urinary tract infection.  Baseline kidney function fluctuates but likely 2.2-3.  Creatinine 7.4 on arrival significantly improved to 4.74 today.  Urine output not well documented but seems to be improving. -IV fluids with bicarbonate as below -Continue to monitor daily Cr, Dose meds for GFR -Monitor Daily I/Os, Daily weight  -Maintain MAP>65 for optimal renal perfusion.  -Avoid nephrotoxic medications including NSAIDs and Vanc/Zosyn combo; I made Voltaren gel as needed instead of scheduled -No signs of urinary obstruction on CT abdomen pelvis yesterday -No dialysis at this time  Severe anion gap and non-anion gap metabolic acidosis: Anion gap now normal and bicarb has improved to 15.  Non-anion gap persists likely associated kidney injury -Continue IV sodium bicarb 125 cc/h -Oral sodium bicarb 1300 mg twice daily  Volume Status: Appears euvolemic.  Continue bicarb hydration as above  Urinary tract infection: Antibiotics per primary team.    Covid positive: Has received vaccination.  Seems to have a relatively benign course.  Management per primary team.   Recommendations conveyed to primary service.    [facility] 06/04/2020 12:52 PM  ___________________________________________________________  CC: AKI on CKD  Interval History/Subjective: Patient has been stable over the past 24 hours.  She looks much more comfortable today and denies specific complaints.  She states that her breathing is better and she overall feels a lot better.  She is urinated without significant issue.  Acidosis and kidney function continue to improve.   Medications:  Current  Facility-Administered Medications  Medication Dose Route Frequency Provider Last Rate Last Admin  . acetaminophen (TYLENOL) tablet 650 mg  650 mg Oral Q6H PRN Elwyn Reach, MD   650 mg at 06/03/20 2306  . albuterol (VENTOLIN HFA) 108 (90 Base) MCG/ACT inhaler 2 puff  2 puff Inhalation Q6H PRN Jonelle Sidle, Mohammad L, MD      . amLODipine (NORVASC) tablet 7.5 mg  7.5 mg Oral Daily Gala Romney L, MD   7.5 mg at 06/04/20 0941  . aspirin EC tablet 81 mg  81 mg Oral Daily Elwyn Reach, MD   81 mg at 06/04/20 0942  . azithromycin (ZITHROMAX) 500 mg in sodium chloride 0.9 % 250 mL IVPB  500 mg Intravenous Q24H Elwyn Reach, MD   Stopped at 06/03/20 2146  . busPIRone (BUSPAR) tablet 15 mg  15 mg Oral TID PRN Elwyn Reach, MD      . carvedilol (COREG) tablet 12.5 mg  12.5 mg Oral BID WC Gala Romney L, MD   12.5 mg at 06/04/20 0802  . cefTRIAXone (ROCEPHIN) 1 g in sodium chloride 0.9 % 100 mL IVPB  1 g Intravenous Q24H Elwyn Reach, MD   Stopped at 06/03/20 2117  . chlorpheniramine-HYDROcodone (TUSSIONEX) 10-8 MG/5ML suspension 5 mL  5 mL Oral Q12H PRN Gala Romney L, MD      . clopidogrel (PLAVIX) tablet 75 mg  75 mg Oral Daily Elwyn Reach, MD   75 mg at 06/04/20 0942  . diclofenac Sodium (VOLTAREN) 1 % topical gel 2 g  2 g Topical TID PRN Reesa Chew, MD      . donepezil (ARICEPT) tablet 5 mg  5 mg Oral Daily Elwyn Reach, MD  5 mg at 06/04/20 0942  . DULoxetine (CYMBALTA) DR capsule 30 mg  30 mg Oral Daily Elwyn Reach, MD   30 mg at 06/04/20 0942  . feeding supplement (ENSURE ENLIVE) (ENSURE ENLIVE) liquid 237 mL  237 mL Oral BID BM Garba, Mohammad L, MD      . ferrous sulfate tablet 325 mg  325 mg Oral Q breakfast Elwyn Reach, MD   325 mg at 06/04/20 0801  . gabapentin (NEURONTIN) capsule 100 mg  100 mg Oral TID Elwyn Reach, MD   100 mg at 06/04/20 0941  . guaiFENesin-dextromethorphan (ROBITUSSIN DM) 100-10 MG/5ML syrup 10 mL  10 mL Oral  Q4H PRN Gala Romney L, MD      . heparin injection 7,500 Units  7,500 Units Subcutaneous Q8H Elgergawy, Silver Huguenin, MD   7,500 Units at 06/04/20 0547  . hydrALAZINE (APRESOLINE) injection 5 mg  5 mg Intravenous Q6H PRN Elgergawy, Silver Huguenin, MD      . Ipratropium-Albuterol (COMBIVENT) respimat 1 puff  1 puff Inhalation Q6H Elwyn Reach, MD   1 puff at 06/04/20 0801  . MEDLINE mouth rinse  15 mL Mouth Rinse BID Gala Romney L, MD   15 mL at 06/04/20 0942  . methylPREDNISolone sodium succinate (SOLU-MEDROL) 40 mg/mL injection 40 mg  40 mg Intravenous Q12H Elgergawy, Silver Huguenin, MD   40 mg at 06/04/20 0803  . metoprolol tartrate (LOPRESSOR) injection 5 mg  5 mg Intravenous Q6H PRN Elwyn Reach, MD   5 mg at 06/03/20 0051  . multivitamin with minerals tablet 1 tablet  1 tablet Oral Daily Elwyn Reach, MD   1 tablet at 06/04/20 0941  . nitroGLYCERIN (NITROSTAT) SL tablet 0.4 mg  0.4 mg Sublingual Q5 min PRN Gala Romney L, MD      . ondansetron (ZOFRAN) tablet 4 mg  4 mg Oral Q6H PRN Elwyn Reach, MD       Or  . ondansetron (ZOFRAN) injection 4 mg  4 mg Intravenous Q6H PRN Gala Romney L, MD      . pantoprazole (PROTONIX) EC tablet 40 mg  40 mg Oral Daily Gala Romney L, MD   40 mg at 06/04/20 0940  . remdesivir 100 mg in sodium chloride 0.9 % 100 mL IVPB  100 mg Intravenous Q24H Rumbarger, Rachel L, RPH 200 mL/hr at 06/04/20 0953 100 mg at 06/04/20 0953  . rosuvastatin (CRESTOR) tablet 40 mg  40 mg Oral Daily Elwyn Reach, MD   40 mg at 06/04/20 0943  . sodium bicarbonate 150 mEq in dextrose 5% 1000 mL infusion  150 mEq Intravenous Continuous Reesa Chew, MD 100 mL/hr at 06/04/20 0902 150 mEq at 06/04/20 0902  . sodium bicarbonate tablet 1,300 mg  1,300 mg Oral BID Elwyn Reach, MD   1,300 mg at 06/04/20 2952      Review of Systems: 10 systems reviewed and negative except per interval history/subjective  Physical Exam: Vitals:   06/04/20 0547  06/04/20 0802  BP: 109/73 (!) 121/95  Pulse: 85 88  Resp: 20 18  Temp: 98.3 F (36.8 C) 98.3 F (36.8 C)  SpO2: 100% 98%   Total I/O In: 120 [P.O.:120] Out: -   Intake/Output Summary (Last 24 hours) at 06/04/2020 1252 Last data filed at 06/04/2020 8413 Gross per 24 hour  Intake 1570 ml  Output --  Net 1570 ml   Constitutional: Pleasant, lying in bed, no distress ENMT: ears and nose without  scars or lesions, MMM CV: Tachycardia, trace pitting edema in the bilateral lower extremities Respiratory: Bilateral chest rise, no increased work of breathing Gastrointestinal: soft, non-tender, no palpable masses or hernias Skin: no visible lesions or rashes Psych: alert, judgement/insight appropriate, appropriate mood and affect   Test Results I personally reviewed new and old clinical labs and radiology tests Lab Results  Component Value Date   NA 141 06/04/2020   K 3.6 06/04/2020   CL 112 (H) 06/04/2020   CO2 15 (L) 06/04/2020   BUN 81 (H) 06/04/2020   CREATININE 4.74 (H) 06/04/2020   CALCIUM 8.0 (L) 06/04/2020   ALBUMIN 2.5 (L) 06/04/2020   PHOS 5.2 (H) 06/04/2020

## 2020-06-04 NOTE — Progress Notes (Signed)
Venous duplex lower ext.  has been completed. Refer to [name] under chart review to view preliminary results.   06/04/2020  2:25 PM Christie Viscomi, Bonnye Fava

## 2020-06-05 ENCOUNTER — Other Ambulatory Visit: Payer: Self-pay

## 2020-06-05 LAB — C-REACTIVE PROTEIN: CRP: 4.6 mg/dL — ABNORMAL HIGH (ref <1.0)

## 2020-06-05 LAB — COMPREHENSIVE METABOLIC PANEL
ALT: 16 U/L (ref 0–44)
AST: 28 U/L (ref 15–41)
Albumin: 2.5 g/dL — ABNORMAL LOW (ref 3.5–5.0)
Alkaline Phosphatase: 73 U/L (ref 38–126)
Anion gap: 15 (ref 5–15)
BUN: 89 mg/dL — ABNORMAL HIGH (ref 8–23)
CO2: 21 mmol/L — ABNORMAL LOW (ref 22–32)
Calcium: 7.8 mg/dL — ABNORMAL LOW (ref 8.9–10.3)
Chloride: 99 mmol/L (ref 98–111)
Creatinine, Ser: 4.07 mg/dL — ABNORMAL HIGH (ref 0.44–1.00)
GFR calc Af Amer: 12 mL/min — ABNORMAL LOW (ref >60)
GFR calc non Af Amer: 10 mL/min — ABNORMAL LOW (ref >60)
Glucose, Bld: 224 mg/dL — ABNORMAL HIGH (ref 70–99)
Potassium: 3.7 mmol/L (ref 3.5–5.1)
Sodium: 135 mmol/L (ref 135–145)
Total Bilirubin: 0.5 mg/dL (ref 0.3–1.2)
Total Protein: 5.5 g/dL — ABNORMAL LOW (ref 6.5–8.1)

## 2020-06-05 LAB — TROPONIN I (HIGH SENSITIVITY)
Troponin I (High Sensitivity): 26 ng/L — ABNORMAL HIGH (ref <18)
Troponin I (High Sensitivity): 25 ng/L — ABNORMAL HIGH (ref <18)
Troponin I (High Sensitivity): 23 ng/L — ABNORMAL HIGH (ref <18)

## 2020-06-05 LAB — CBC WITH DIFFERENTIAL/PLATELET
Abs Immature Granulocytes: 0.14 10*3/uL — ABNORMAL HIGH (ref 0.00–0.07)
Basophils Absolute: 0 10*3/uL (ref 0.0–0.1)
Basophils Relative: 0 %
Eosinophils Absolute: 0 10*3/uL (ref 0.0–0.5)
Eosinophils Relative: 0 %
HCT: 20.2 % — ABNORMAL LOW (ref 36.0–46.0)
Hemoglobin: 7.1 g/dL — ABNORMAL LOW (ref 12.0–15.0)
Immature Granulocytes: 1 %
Lymphocytes Relative: 9 %
Lymphs Abs: 1.1 10*3/uL (ref 0.7–4.0)
MCH: 28.7 pg (ref 26.0–34.0)
MCHC: 35.1 g/dL (ref 30.0–36.0)
MCV: 81.8 fL (ref 80.0–100.0)
Monocytes Absolute: 0.3 10*3/uL (ref 0.1–1.0)
Monocytes Relative: 3 %
Neutro Abs: 10.9 10*3/uL — ABNORMAL HIGH (ref 1.7–7.7)
Neutrophils Relative %: 87 %
Platelets: 314 10*3/uL (ref 150–400)
RBC: 2.47 MIL/uL — ABNORMAL LOW (ref 3.87–5.11)
RDW: 15 % (ref 11.5–15.5)
WBC: 12.5 10*3/uL — ABNORMAL HIGH (ref 4.0–10.5)
nRBC: 1.7 % — ABNORMAL HIGH (ref 0.0–0.2)

## 2020-06-05 LAB — PHOSPHORUS: Phosphorus: 4 mg/dL (ref 2.5–4.6)

## 2020-06-05 LAB — FERRITIN: Ferritin: 2283 ng/mL — ABNORMAL HIGH (ref 11–307)

## 2020-06-05 LAB — MAGNESIUM: Magnesium: 1.7 mg/dL (ref 1.7–2.4)

## 2020-06-05 LAB — D-DIMER, QUANTITATIVE: D-Dimer, Quant: 3.77 ug/mL-FEU — ABNORMAL HIGH (ref 0.00–0.50)

## 2020-06-05 NOTE — Progress Notes (Signed)
Nephrology Follow-Up Consult note   Assessment/Recommendations: Alyssa [name] is a/an 79 y.o. female with a past medical history significant for chronic kidney disease, hypertension presenting with Covid, UTI, AKI on CKD  AKI on CKD: Likely secondary to dehydration, possible tubular injury, urinary tract infection.  Baseline kidney function fluctuates but likely 2.2-3.  Creatinine 7.4 on arrival and continues to improve down to 4 today.  Urine output not well documented. -Continue oral hydration -Continue to monitor daily Cr, Dose meds for GFR -Monitor Daily I/Os, Daily weight  -Maintain MAP>65 for optimal renal perfusion.  -Avoid nephrotoxic medications including NSAIDs and Vanc/Zosyn combo; I made Voltaren gel as needed instead of scheduled -No signs of urinary obstruction on CT abdomen pelvis  -No dialysis at this time  Severe anion gap and non-anion gap metabolic acidosis: Related to renal failure and significantly improved.  Continue oral bicarb supplementation and stop when serum bicarb is greater than 22  Volume Status: Appears euvolemic.  Stop IV bicarb infusion and encourage p.o. hydration  E. coli urinary tract infection: Activities back and ceftriaxone is appropriate.  Covid positive: Has received vaccination.  Seems to have a relatively benign course.  Management per primary team.   Recommendations conveyed to primary service.    [facility] 06/05/2020 11:44 AM  ___________________________________________________________  CC: AKI on CKD  Interval History/Subjective: Patient appears well without specific complaint.  She denies nausea, vomiting, diarrhea.  She has not had chest pain.  Minimal shortness of breath.  Urinalysis with E. coli UTI.  Creatinine improving   Medications:  Current Facility-Administered Medications  Medication Dose Route Frequency Provider Last Rate Last Admin  . (feeding supplement) PROSource Plus liquid  30 mL  30 mL Oral BID BM Ghimire, Henreitta Leber, MD   30 mL at 06/05/20 0920  . acetaminophen (TYLENOL) tablet 650 mg  650 mg Oral Q6H PRN Elwyn Reach, MD   650 mg at 06/03/20 2306  . albuterol (VENTOLIN HFA) 108 (90 Base) MCG/ACT inhaler 2 puff  2 puff Inhalation Q6H PRN Jonelle Sidle, Mohammad L, MD      . amLODipine (NORVASC) tablet 7.5 mg  7.5 mg Oral Daily Gala Romney L, MD   7.5 mg at 06/05/20 0920  . aspirin EC tablet 81 mg  81 mg Oral Daily Elwyn Reach, MD   81 mg at 06/05/20 0920  . busPIRone (BUSPAR) tablet 15 mg  15 mg Oral TID PRN Gala Romney L, MD      . carvedilol (COREG) tablet 12.5 mg  12.5 mg Oral BID WC Gala Romney L, MD   12.5 mg at 06/05/20 0750  . cephALEXin (KEFLEX) capsule 500 mg  500 mg Oral Q24H Jonetta Osgood, MD   500 mg at 06/04/20 1718  . chlorpheniramine-HYDROcodone (TUSSIONEX) 10-8 MG/5ML suspension 5 mL  5 mL Oral Q12H PRN Gala Romney L, MD      . clopidogrel (PLAVIX) tablet 75 mg  75 mg Oral Daily Elwyn Reach, MD   75 mg at 06/05/20 0919  . diclofenac Sodium (VOLTAREN) 1 % topical gel 2 g  2 g Topical TID PRN Reesa Chew, MD   2 g at 06/04/20 2020  . donepezil (ARICEPT) tablet 5 mg  5 mg Oral Daily Elwyn Reach, MD   5 mg at 06/05/20 0921  . DULoxetine (CYMBALTA) DR capsule 30 mg  30 mg Oral Daily Elwyn Reach, MD   30 mg at 06/05/20 0920  . feeding  supplement (ENSURE ENLIVE) (ENSURE ENLIVE) liquid 237 mL  237 mL Oral BID BM Gala Romney L, MD   237 mL at 06/05/20 0921  . ferrous sulfate tablet 325 mg  325 mg Oral Q breakfast Elwyn Reach, MD   325 mg at 06/05/20 0750  . gabapentin (NEURONTIN) capsule 100 mg  100 mg Oral TID Elwyn Reach, MD   100 mg at 06/05/20 0919  . guaiFENesin-dextromethorphan (ROBITUSSIN DM) 100-10 MG/5ML syrup 10 mL  10 mL Oral Q4H PRN Gala Romney L, MD      . heparin injection 5,000 Units  5,000 Units Subcutaneous Q8H Jonetta Osgood, MD   5,000 Units at 06/05/20 3016  .  hydrALAZINE (APRESOLINE) injection 5 mg  5 mg Intravenous Q6H PRN Elgergawy, Silver Huguenin, MD      . Ipratropium-Albuterol (COMBIVENT) respimat 1 puff  1 puff Inhalation Q6H Elwyn Reach, MD   1 puff at 06/05/20 0750  . MEDLINE mouth rinse  15 mL Mouth Rinse BID Gala Romney L, MD   15 mL at 06/05/20 0921  . methylPREDNISolone sodium succinate (SOLU-MEDROL) 40 mg/mL injection 40 mg  40 mg Intravenous Q12H Elgergawy, Silver Huguenin, MD   40 mg at 06/05/20 0750  . metoprolol tartrate (LOPRESSOR) injection 5 mg  5 mg Intravenous Q6H PRN Elwyn Reach, MD   5 mg at 06/03/20 0051  . multivitamin with minerals tablet 1 tablet  1 tablet Oral Daily Elwyn Reach, MD   1 tablet at 06/05/20 0920  . nitroGLYCERIN (NITROSTAT) SL tablet 0.4 mg  0.4 mg Sublingual Q5 min PRN Elwyn Reach, MD   0.4 mg at 06/05/20 0404  . ondansetron (ZOFRAN) tablet 4 mg  4 mg Oral Q6H PRN Elwyn Reach, MD       Or  . ondansetron (ZOFRAN) injection 4 mg  4 mg Intravenous Q6H PRN Gala Romney L, MD      . pantoprazole (PROTONIX) EC tablet 40 mg  40 mg Oral Daily Elwyn Reach, MD   40 mg at 06/05/20 0920  . remdesivir 100 mg in sodium chloride 0.9 % 100 mL IVPB  100 mg Intravenous Q24H Rumbarger, Rachel L, RPH 200 mL/hr at 06/05/20 0921 100 mg at 06/05/20 0921  . rosuvastatin (CRESTOR) tablet 40 mg  40 mg Oral Daily Elwyn Reach, MD   40 mg at 06/05/20 0921  . sodium bicarbonate tablet 1,300 mg  1,300 mg Oral BID Elwyn Reach, MD   1,300 mg at 06/05/20 0109      Review of Systems: 10 systems reviewed and negative except per interval history/subjective  Physical Exam: Vitals:   06/05/20 0732 06/05/20 0920  BP: 122/81 137/84  Pulse: 84   Resp: 16   Temp: (!) 97.4 F (36.3 C)   SpO2: 98%    Total I/O In: 240 [P.O.:240] Out: -   Intake/Output Summary (Last 24 hours) at 06/05/2020 1144 Last data filed at 06/05/2020 0900 Gross per 24 hour  Intake 480 ml  Output 400 ml  Net 80 ml    Constitutional: Pleasant, sitting in chair, no apparent distress ENMT: ears and nose without scars or lesions, MMM CV: Tachycardia, no obvious murmur Respiratory: Bilateral chest rise, no increased work of breathing Gastrointestinal: soft, non-tender, no palpable masses or hernias Skin: no visible lesions or rashes Psych: alert, judgement/insight appropriate, appropriate mood and affect   Test Results I personally reviewed new and old clinical labs and radiology tests Lab Results  Component Value Date   NA 135 06/05/2020   K 3.7 06/05/2020   CL 99 06/05/2020   CO2 21 (L) 06/05/2020   BUN 89 (H) 06/05/2020   CREATININE 4.07 (H) 06/05/2020   CALCIUM 7.8 (L) 06/05/2020   ALBUMIN 2.5 (L) 06/05/2020   PHOS 4.0 06/05/2020

## 2020-06-05 NOTE — Progress Notes (Addendum)
At 0238 pt reported new onset CP 8/10. Patient VS taken, pt cleaned up, EKG completed showing NSR.  System downtime in place - paged MD, order placed for troponin lab STAT.  Nitroglycerin SL dose pulled and given at 0344.  At [id number] patient reported the pain had resolved.

## 2020-06-05 NOTE — Progress Notes (Addendum)
PROGRESS NOTE                                                                                                                                                                                                             Patient Demographics:    Alyssa [name], is a 79 y.o. female, DOB - April 18, 1941, FOY:[id number]  Outpatient Primary MD for the patient is [name], DO   Admit date - 06/02/2020   LOS - 3  Chief Complaint  Patient presents with  . Shortness of Breath  . Loss of Consciousness       Brief Narrative: Patient is a 79 y.o. female with PMHx of COPD, chronic pain syndrome, CKD stage IV, HTN, HLD-who presented to the hospital with chills, cough, poor appetite-she was found to have COVID-19 pneumonia and AKI.  She was subsequently admitted to the hospitalist service.  See below for further details.  COVID-19 vaccinated status: vaccinated  Significant Events: 8/16>> Admit to [facility] for AKI on CKD stage IV, COVID-19 infection.  Significant studies: 8/12>> Echo: EF 55-60% 8/16>>Chest x-ray: No acute process in the chest. 8/16>> CT abdomen/pelvis: Sigmoid diverticulosis, large gastric hernia-lobulated low-attenuation anterior to the expected region of the right common femoral artery-may be vascular in origin 8/17>> chest x-ray: Mild bibasilar infiltrates. 8/17>> normal perfusion scan without evidence of PE. 8/18>> bilateral lower extremity Doppler: No DVT-partial thrombosed pseudoaneurysm in the right groin.  COVID-19 medications: Steroids: 8/16>> Remdesivir: 8/16>>  Antibiotics: Rocephin: 8/16>> 8/17 Zithromax: 8/16>> 8/17 Keflex: 8/18>>  Microbiology data: 8/16>> urine culture: E. coli (resistant to ampicillin/Unasyn)  Procedures: None  Consults: None  DVT prophylaxis: heparin injection 5,000 Units Start: 06/04/20 2200    Subjective:   No major issues overnight-on room air this  morning.   Assessment  & Plan :   Acute hypoxic Resp Failure due to COPD exacerbation and Covid 19 Viral pneumonia: Titrated to room air this morning-continue bronchodilators/steroids/remdesivir.    Doubt sepsis present on admission-has been ruled out.  Fever: afebrile O2 requirements:  SpO2: 92 % O2 Flow Rate (L/min): 1 L/min   COVID-19 Labs: Recent Labs    06/03/20 0413 06/04/20 0251 06/05/20 0337  DDIMER 5.77* 4.94* 3.77*  FERRITIN 1,642* 693* 2,283*  CRP 9.8* 6.6* 4.6*       Component Value Date/Time   BNP 90.8 01/02/2020 1430  Recent Labs  Lab 06/03/20 1235  PROCALCITON 1.12    Lab Results  Component Value Date   SARSCOV2NAA POSITIVE (A) 06/02/2020   [place] NEGATIVE 01/06/2020   [place] NEGATIVE 01/02/2020   [place] NEGATIVE 11/01/2019      Prone/Incentive Spirometry: encouraged  incentive spirometry use 3-4/hour  AKI on CKD stage IV: AKI hemodynamically mediated-slowly improving-continue supportive care-nephrology following.  E. coli UTI: Previously on Rocephin-transition to Keflex.  Anemia: Secondary to acute illness superimposed on anemia of CKD-worsened by hemodilution.  Denies any melena/hematochezia.  Continue to observe-transfuse if  Hb less than 7.  HTN: BP stable-continue amlodipine  Chronic pain syndrome: Stable-continue Neurontin/Cymbalta.  History of PAD (history of femoropopliteal bypass)/carotid stenosis: Continue ASA/Plavix.  Dementia: Appears to be mild-on Aricept.  Partial thrombosed pseudoaneurysm in the right groin: Discussed with Dr. Donnetta Hutching over the phone-recommendations are for observation/follow-up in the outpatient setting-no intervention needed while inpatient.  Debility/deconditioning: Appears frail-debilitated-not sure what her baseline is-suspect could further worsening from acute illness-have ordered PT/OT.  Palliative care discussion: Full code.  Per son whom I discussed over the phone on 8/18-no longer  being followed by hospice-full code-continue full scope of treatment.  Patient is not on home O2 per son.   ABG:    Component Value Date/Time   PHART 7.346 (L) 11/01/2019 0228   PCO2ART 17.6 (LL) 11/01/2019 0228   PO2ART 97.0 11/01/2019 0228   HCO3 10.4 (L) 06/03/2020 1421   TCO2 11 (L) 06/03/2020 1421   ACIDBASEDEF 14.0 (H) 06/03/2020 1421   O2SAT 79.0 06/03/2020 1421    Vent Settings: N/A  Condition - Guarded  Family Communication  : Called son at [phone] to leave voicemail as mailbox full.  Code Status :  Full Code  Diet :  Diet Order            Diet Heart Room service appropriate? Yes; Fluid consistency: Thin  Diet effective now                  Disposition Plan  :   Status is: Inpatient  Remains inpatient appropriate because:Inpatient level of care appropriate due to severity of illness   Dispo: The patient is from: Home              Anticipated d/c is to: TBD              Anticipated d/c date is: 1-2 days              Patient currently is not medically stable to d/c.   Barriers to discharge: Resolving renal failure-not yet back to baseline-complete 5 days of remdesivir. Antimicorbials  :    Anti-infectives (From admission, onward)   Start     Dose/Rate Route Frequency Ordered Stop   06/04/20 1800  cephALEXin (KEFLEX) capsule 500 mg        500 mg Oral Every 24 hours 06/04/20 1408 06/07/20 1759   06/03/20 1000  remdesivir 100 mg in sodium chloride 0.9 % 100 mL IVPB  Status:  Discontinued       "Followed by" Linked Group Details   100 mg 200 mL/hr over 30 Minutes Intravenous Daily 06/02/20 1938 06/02/20 1954   06/03/20 1000  remdesivir 100 mg in sodium chloride 0.9 % 100 mL IVPB       "Followed by" Linked Group Details   100 mg 200 mL/hr over 30 Minutes Intravenous Every 24 hours 06/02/20 1827 06/07/20 0959   06/02/20 1945  cefTRIAXone (ROCEPHIN) 1 g in sodium  chloride 0.9 % 100 mL IVPB  Status:  Discontinued        1 g 200 mL/hr over 30  Minutes Intravenous Every 24 hours 06/02/20 1938 06/04/20 1408   06/02/20 1945  azithromycin (ZITHROMAX) 500 mg in sodium chloride 0.9 % 250 mL IVPB  Status:  Discontinued        500 mg 250 mL/hr over 60 Minutes Intravenous Every 24 hours 06/02/20 1938 06/04/20 1408   06/02/20 1937  remdesivir 200 mg in sodium chloride 0.9% 250 mL IVPB  Status:  Discontinued       "Followed by" Linked Group Details   200 mg 580 mL/hr over 30 Minutes Intravenous Once 06/02/20 1938 06/02/20 1954   06/02/20 1930  remdesivir 200 mg in sodium chloride 0.9% 250 mL IVPB       "Followed by" Linked Group Details   200 mg 580 mL/hr over 30 Minutes Intravenous Once 06/02/20 1827 06/02/20 2119      Inpatient Medications  Scheduled Meds: . (feeding supplement) PROSource Plus  30 mL Oral BID BM  . amLODipine  7.5 mg Oral Daily  . aspirin EC  81 mg Oral Daily  . carvedilol  12.5 mg Oral BID WC  . cephALEXin  500 mg Oral Q24H  . clopidogrel  75 mg Oral Daily  . donepezil  5 mg Oral Daily  . DULoxetine  30 mg Oral Daily  . feeding supplement (ENSURE ENLIVE)  237 mL Oral BID BM  . ferrous sulfate  325 mg Oral Q breakfast  . gabapentin  100 mg Oral TID  . heparin  5,000 Units Subcutaneous Q8H  . Ipratropium-Albuterol  1 puff Inhalation Q6H  . mouth rinse  15 mL Mouth Rinse BID  . methylPREDNISolone (SOLU-MEDROL) injection  40 mg Intravenous Q12H  . multivitamin with minerals  1 tablet Oral Daily  . pantoprazole  40 mg Oral Daily  . rosuvastatin  40 mg Oral Daily  . sodium bicarbonate  1,300 mg Oral BID   Continuous Infusions: . remdesivir 100 mg in NS 100 mL 100 mg (06/05/20 0921)   PRN Meds:.acetaminophen, albuterol, busPIRone, chlorpheniramine-HYDROcodone, diclofenac Sodium, guaiFENesin-dextromethorphan, hydrALAZINE, metoprolol tartrate, nitroGLYCERIN, ondansetron **OR** ondansetron (ZOFRAN) IV   Time Spent in minutes  25  See all Orders from today for further details   Oren Binet M.D on 06/05/2020  at 3:51 PM  To page go to [email or web address] - use universal password  [facility] -  Office  [phone]    Objective:   Vitals:   06/05/20 0400 06/05/20 0732 06/05/20 0920 06/05/20 1432  BP: 124/88 122/81 137/84 138/82  Pulse: 84 84  80  Resp: 18 16  16   Temp: 98.4 F (36.9 C) (!) 97.4 F (36.3 C)  98.4 F (36.9 C)  TempSrc: Oral Oral  Oral  SpO2: 96% 98%  92%  Weight:      Height:        Wt Readings from Last 3 Encounters:  06/03/20 42.1 kg  04/14/20 44.5 kg  02/10/20 45 kg     Intake/Output Summary (Last 24 hours) at 06/05/2020 1551 Last data filed at 06/05/2020 0900 Gross per 24 hour  Intake 360 ml  Output 300 ml  Net 60 ml     Physical Exam Gen Exam:Alert awake-not in any distress HEENT:atraumatic, normocephalic Chest: B/L clear to auscultation anteriorly CVS:S1S2 regular Abdomen:soft non tender, non distended Extremities:no edema Neurology: Non focal Skin: no rash   Data Review:    CBC Recent Labs  Lab  06/02/20 1512 06/02/20 1512 06/02/20 1954 06/03/20 0413 06/03/20 1421 06/04/20 0251 06/05/20 0337  WBC 15.3*  --  14.9* 14.4*  --  12.8* 12.5*  HGB 9.7*   < > 8.9* 8.6* 8.2* 7.6* 7.1*  HCT 29.8*   < > 27.3* 26.4* 24.0* 21.6* 20.2*  PLT 404*  --  382 396  --  348 314  MCV 89.0  --  88.9 88.6  --  80.9 81.8  MCH 29.0  --  29.0 28.9  --  28.5 28.7  MCHC 32.6  --  32.6 32.6  --  35.2 35.1  RDW 16.8*  --  16.8* 17.1*  --  15.2 15.0  LYMPHSABS 1.2  --   --  0.9  --  0.9 1.1  MONOABS 1.0  --   --  0.2  --  0.2 0.3  EOSABS 0.4  --   --  0.0  --  0.0 0.0  BASOSABS 0.1  --   --  0.1  --  0.0 0.0   < > = values in this interval not displayed.    Chemistries  Recent Labs  Lab 06/02/20 1512 06/02/20 1954 06/03/20 0413 06/03/20 0413 06/03/20 1235 06/03/20 1417 06/03/20 1421 06/04/20 0251 06/05/20 0337  NA 143  --  144   < > 142 142 142 141 135  K 4.8  --  4.7   < > 3.9 3.7 3.7 3.6 3.7  CL 117*  --  119*  --  114* 112*  --  112* 99   CO2 7*  --  <7*  --  7* 11*  --  15* 21*  GLUCOSE 93  --  223*  --  225* 195*  --  171* 224*  BUN 78*  --  77*  --  75* 76*  --  81* 89*  CREATININE 7.43*   < > 6.27*  --  5.32* 5.42*  --  4.74* 4.07*  CALCIUM 9.3  --  8.8*  --  8.4* 8.5*  --  8.0* 7.8*  MG  --   --  1.8  --   --   --   --  1.6* 1.7  AST 18  --  25  --   --   --   --  21 28  ALT 12  --  14  --   --   --   --  13 16  ALKPHOS 107  --  98  --   --   --   --  82 73  BILITOT 0.6  --  0.5  --   --   --   --  0.2* 0.5   < > = values in this interval not displayed.   ------------------------------------------------------------------------------------------------------------------ No results for input(s): CHOL, HDL, LDLCALC, TRIG, CHOLHDL, LDLDIRECT in the last 72 hours.  Lab Results  Component Value Date   HGBA1C 6.5 (H) 09/19/2019   ------------------------------------------------------------------------------------------------------------------ No results for input(s): TSH, T4TOTAL, T3FREE, THYROIDAB in the last 72 hours.  Invalid input(s): FREET3 ------------------------------------------------------------------------------------------------------------------ Recent Labs    06/04/20 0251 06/05/20 0337  FERRITIN 693* 2,283*    Coagulation profile No results for input(s): INR, PROTIME in the last 168 hours.  Recent Labs    06/04/20 0251 06/05/20 0337  DDIMER 4.94* 3.77*    Cardiac Enzymes No results for input(s): CKMB, TROPONINI, MYOGLOBIN in the last 168 hours.  Invalid input(s): CK ------------------------------------------------------------------------------------------------------------------    Component Value Date/Time   BNP 90.8 01/02/2020 1430  Micro Results Recent Results (from the past 240 hour(s))  SARS Coronavirus 2 by RT PCR (hospital order, performed in [facility] hospital lab) Nasopharyngeal Nasopharyngeal Swab     Status: Abnormal   Collection Time: 06/02/20  2:15 PM   Specimen:  Nasopharyngeal Swab  Result Value Ref Range Status   SARS Coronavirus 2 POSITIVE (A) NEGATIVE Final    Comment: RESULT CALLED TO, READ BACK BY AND VERIFIED WITH: E,ADKINS @1604  06/02/20 EB (NOTE) SARS-CoV-2 target nucleic acids are DETECTED  SARS-CoV-2 RNA is generally detectable in upper respiratory specimens  during the acute phase of infection.  Positive results are indicative  of the presence of the identified virus, but do not rule out bacterial infection or co-infection with other pathogens not detected by the test.  Clinical correlation with patient history and  other diagnostic information is necessary to determine patient infection status.  The expected result is negative.  Fact Sheet for Patients:   StrictlyIdeas.no   Fact Sheet for Healthcare Providers:   BankingDealers.co.za    This test is not yet approved or cleared by the [place] FDA and  has been authorized for detection and/or diagnosis of SARS-CoV-2 by FDA under an Emergency Use Authorization (EUA).  This EUA will remain in effect (meaning this test can be  used) for the duration of  the COVID-19 declaration under Section 564(b)(1) of the Act, 21 U.S.C. section 360-bbb-3(b)(1), unless the authorization is terminated or revoked sooner.  Performed at [facility], [place] [address], [place], [place] [address]   Culture, Urine     Status: Abnormal   Collection Time: 06/02/20  9:15 PM   Specimen: Urine, Random  Result Value Ref Range Status   Specimen Description URINE, RANDOM  Final   Special Requests   Final    NONE Performed at [facility], [place] [address], [place], [place] [address]    Culture >=100,000 COLONIES/mL ESCHERICHIA COLI (A)  Final   Report Status 06/04/2020 FINAL  Final   Organism ID, Bacteria ESCHERICHIA COLI (A)  Final      Susceptibility   Escherichia coli - MIC*    AMPICILLIN >=32 RESISTANT Resistant     CEFAZOLIN <=4  SENSITIVE Sensitive     CEFTRIAXONE <=0.25 SENSITIVE Sensitive     CIPROFLOXACIN <=0.25 SENSITIVE Sensitive     GENTAMICIN <=1 SENSITIVE Sensitive     IMIPENEM <=0.25 SENSITIVE Sensitive     NITROFURANTOIN <=16 SENSITIVE Sensitive     TRIMETH/SULFA <=20 SENSITIVE Sensitive     AMPICILLIN/SULBACTAM >=32 RESISTANT Resistant     PIP/TAZO <=4 SENSITIVE Sensitive     * >=100,000 COLONIES/mL ESCHERICHIA COLI    Radiology Reports CT ABDOMEN PELVIS WO CONTRAST  Result Date: 06/02/2020 CLINICAL DATA:  Shortness of breath, chest pain and abdominal pain. EXAM: CT ABDOMEN AND PELVIS WITHOUT CONTRAST TECHNIQUE: Multidetector CT imaging of the abdomen and pelvis was performed following the standard protocol without IV contrast. COMPARISON:  May 02, 2020 and November 01, 2019 FINDINGS: Lower chest: A cluster of 2 mm noncalcified lung nodules are seen within the posterolateral aspect of the right lung base. Hepatobiliary: No focal liver abnormality is seen. No gallstones, gallbladder wall thickening, or biliary dilatation. Pancreas: Unremarkable. No pancreatic ductal dilatation or surrounding inflammatory changes. Spleen: Normal in size without focal abnormality. Adrenals/Urinary Tract: Adrenal glands are unremarkable. Kidneys are normal, without renal calculi, focal lesion, or hydronephrosis. Bladder is unremarkable. Stomach/Bowel: There is a large gastric hernia. Appendix appears normal. No evidence of  bowel dilatation. Noninflamed diverticula are seen throughout the sigmoid colon. Vascular/Lymphatic: There is marked severity calcification of the abdominal aorta and bilateral common iliac arteries, without evidence of aneurysmal dilatation. 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 A predominant stable 2.8 cm x 2.4 cm mildly lobulated area of heterogeneous low attenuation is seen within the region anterior to the right common femoral artery (axial CT image 58, CT series number 3). Reproductive: The uterus is mildly enlarged  and heterogeneous in appearance. The bilateral adnexa are limited in evaluation secondary to streak artifact. Other: No abdominal wall hernia or abnormality. No abdominopelvic ascites. Musculoskeletal: A total right hip replacement is seen with extensive amount of associated streak artifact. Subsequently limited evaluation of the adjacent osseous and soft tissue structures is noted. Bilateral metallic density pedicle screws are seen at the levels of L4 and L5. Metallic density operative material is seen along the L4-L5 intervertebral disc space. IMPRESSION: 1. Sigmoid diverticulosis without evidence of acute diverticulitis. 2. Postoperative changes within the lower lumbar spine. 3. Total right hip replacement. 4. Large gastric hernia. 5. Evidence of prior fem-fem bypass graft. 6. Predominant stable area of lobulated low-attenuation anterior to the expected region of the right common femoral artery. While this may be vascular in origin, the presence of a stable soft tissue mass cannot be excluded. Aortic Atherosclerosis (ICD10-I70.0). Electronically Signed   By: Virgina Norfolk M.D.   On: 06/02/2020 17:49   NM Pulmonary Perfusion  Result Date: 06/03/2020 CLINICAL DATA:  COVID-19 positive EXAM: NUCLEAR MEDICINE PERFUSION LUNG SCAN TECHNIQUE: Perfusion images were obtained in multiple projections after intravenous injection of radiopharmaceutical. Ventilation scans intentionally deferred if perfusion scan and chest x-ray adequate for interpretation during COVID 19 epidemic. RADIOPHARMACEUTICALS:  4.2 mCi Tc-67m MAA IV COMPARISON:  Same day chest x-ray.  Perfusion scan 01/02/2020 FINDINGS: Physiologic distribution of radiotracer throughout both lungs. No perfusion defect. IMPRESSION: Normal perfusion scan without evidence for pulmonary embolism. Electronically Signed   By: Davina Poke D.O.   On: 06/03/2020 16:39   DG Chest Port 1 View  Result Date: 06/03/2020 CLINICAL DATA:  Elevated D-dimer. EXAM:  PORTABLE CHEST 1 VIEW COMPARISON:  06/02/2020. FINDINGS: Mediastinum and hilar structures normal. Heart size normal. Mild bibasilar atelectasis/infiltrates. No pleural effusion or pneumothorax. Degenerative changes thoracic spine and both shoulders. No acute bony abnormality. IMPRESSION: Mild bibasilar atelectasis/infiltrates. Electronically Signed   By: Marcello Moores  Register   On: 06/03/2020 14:26   DG Chest Port 1 View  Result Date: 06/02/2020 CLINICAL DATA:  Shortness of breath, COVID positive contact EXAM: PORTABLE CHEST 1 VIEW COMPARISON:  03/14/2020 FINDINGS: No new consolidation or edema. Chronic interstitial prominence. No pleural effusion or pneumothorax. Stable cardiomediastinal contours with normal heart size. IMPRESSION: No acute process in the chest. Electronically Signed   By: Macy Mis M.D.   On: 06/02/2020 14:35   ECHOCARDIOGRAM COMPLETE  Result Date: 05/29/2020    ECHOCARDIOGRAM REPORT   Patient Name:   Alyssa [name] Date of Exam: 05/29/2020 Medical Rec #:  [id number]            Height:       63.0 in Accession #:    [id number]           Weight:       98.2 lb Date of Birth:  26-Mar-1941           BSA:          1.429 m Patient Age:    60 years  BP:           142/86 mmHg Patient Gender: F                    HR:           92 bpm. Exam Location:  [address] Procedure: 2D Echo, 3D Echo, Cardiac Doppler, Color Doppler and Strain Analysis Indications:    I21.4 MI  History:        Patient has prior history of Echocardiogram examinations, most                 recent 01/03/2020. Previous Myocardial Infarction, PVD,                 Signs/Symptoms:Syncope; Risk Factors:Current Smoker and                 Dyslipidemia. CKD stage 4. Anemia.  Sonographer:    Jessee Avers, RDCS Referring Phys: [place]  1. Left ventricular ejection fraction, by estimation, is 55 to 60%. The left ventricle has normal function. The left ventricle has no regional wall motion  abnormalities. There is severe concentric left ventricular hypertrophy. Left ventricular diastolic  parameters are indeterminate. Elevated left ventricular end-diastolic pressure.  2. Right ventricular systolic function is normal. The right ventricular size is normal. Tricuspid regurgitation signal is inadequate for assessing PA pressure.  3. The mitral valve is grossly normal. Trivial mitral valve regurgitation. No evidence of mitral stenosis.  4. The aortic valve is tricuspid. Aortic valve regurgitation is not visualized. No aortic stenosis is present.  5. Aortic dilatation noted. There is borderline dilatation of the ascending aorta measuring 38 mm. Comparison(s): 01/03/20 EF 60-65%. FINDINGS  Left Ventricle: Basal lateral wall incompletely visualized, cannot exclude focal wall motion abnormality. Left ventricular ejection fraction, by estimation, is 55 to 60%. The left ventricle has normal function. The left ventricle has no regional wall motion abnormalities. Global longitudinal strain performed but not reported based on interpreter judgement due to suboptimal tracking. The left ventricular internal cavity size was normal in size. There is severe concentric left ventricular hypertrophy. Left ventricular diastolic parameters are indeterminate. Elevated left ventricular end-diastolic pressure. Right Ventricle: The right ventricular size is normal. No increase in right ventricular wall thickness. Right ventricular systolic function is normal. Tricuspid regurgitation signal is inadequate for assessing PA pressure. Left Atrium: Left atrial size was normal in size. Right Atrium: Right atrial size was normal in size. Pericardium: There is no evidence of pericardial effusion. Mitral Valve: The mitral valve is grossly normal. There is mild thickening of the mitral valve leaflet(s). There is mild calcification of the mitral valve leaflet(s). Trivial mitral valve regurgitation. No evidence of mitral valve stenosis.  Tricuspid Valve: The tricuspid valve is normal in structure. Tricuspid valve regurgitation is trivial. No evidence of tricuspid stenosis. Aortic Valve: The aortic valve is tricuspid. Aortic valve regurgitation is not visualized. No aortic stenosis is present. Pulmonic Valve: The pulmonic valve was not well visualized. Pulmonic valve regurgitation is trivial. No evidence of pulmonic stenosis. Aorta: Aortic dilatation noted. There is borderline dilatation of the ascending aorta measuring 38 mm. Venous: The inferior vena cava was not well visualized. IAS/Shunts: The atrial septum is grossly normal.  LEFT VENTRICLE PLAX 2D LVIDd:         3.30 cm  Diastology LVIDs:         2.30 cm  LV e' lateral:   5.78 cm/s LV PW:  1.50 cm  LV E/e' lateral: 10.8 LV IVS:        1.90 cm  LV e' medial:    2.40 cm/s LVOT diam:     2.00 cm  LV E/e' medial:  26.0 LV SV:         37 LV SV Index:   26       2D Longitudinal Strain LVOT Area:     3.14 cm 2D Strain GLS (A2C):   -15.7 %                         2D Strain GLS (A3C):   -10.0 %                         2D Strain GLS (A4C):   -10.1 %                         2D Strain GLS Avg:     -12.0 %                          3D Volume EF:                         3D EF:        58 %                         LV EDV:       76 ml                         LV ESV:       32 ml                         LV SV:        44 ml RIGHT VENTRICLE RV Basal diam:  2.60 cm RV S prime:     12.90 cm/s TAPSE (M-mode): 2.0 cm LEFT ATRIUM             Index       RIGHT ATRIUM           Index LA diam:        3.50 cm 2.45 cm/m  RA Pressure: 3.00 mmHg LA Vol (A2C):   15.1 ml 10.57 ml/m RA Area:     6.29 cm LA Vol (A4C):   14.5 ml 10.15 ml/m RA Volume:   9.35 ml   6.54 ml/m LA Biplane Vol: 14.7 ml 10.29 ml/m  AORTIC VALVE LVOT Vmax:   77.50 cm/s LVOT Vmean:  49.100 cm/s LVOT VTI:    0.117 m  AORTA Ao Root diam: 2.90 cm Ao Asc diam:  3.80 cm MITRAL VALVE                TRICUSPID VALVE                             Estimated  RAP:  3.00 mmHg  MV E velocity: 62.40 cm/s   SHUNTS MV A velocity: 117.00 cm/s  Systemic VTI:  0.12 m MV E/A ratio:  0.53         Systemic Diam: 2.00 cm Buford Dresser MD Electronically signed by Buford Dresser MD Signature Date/Time: 05/29/2020/4:38:39 PM  Final    VAS [place] LOWER EXTREMITY VENOUS (DVT)  Result Date: 06/04/2020  Lower Venous DVTStudy Other Indications: Covid positive with elevated d-dimer. Comparison Study: 12/12/18 LE venous - negative Performing Technologist: Velva Harman Sturdivant RDMS, RVT  Examination Guidelines: A complete evaluation includes B-mode imaging, spectral Doppler, color Doppler, and power Doppler as needed of all accessible portions of each vessel. Bilateral testing is considered an integral part of a complete examination. Limited examinations for reoccurring indications may be performed as noted. The reflux portion of the exam is performed with the patient in reverse Trendelenburg.  +---------+---------------+---------+-----------+----------+--------------+ RIGHT    CompressibilityPhasicitySpontaneityPropertiesThrombus Aging +---------+---------------+---------+-----------+----------+--------------+ CFV      Full           Yes      Yes                                 +---------+---------------+---------+-----------+----------+--------------+ SFJ      Full                                                        +---------+---------------+---------+-----------+----------+--------------+ FV Prox  Full                                                        +---------+---------------+---------+-----------+----------+--------------+ FV Mid   Full                                                        +---------+---------------+---------+-----------+----------+--------------+ FV DistalFull                                                        +---------+---------------+---------+-----------+----------+--------------+ PFV      Full                                                         +---------+---------------+---------+-----------+----------+--------------+ POP      Full           Yes      Yes                                 +---------+---------------+---------+-----------+----------+--------------+ PTV      Full                                                        +---------+---------------+---------+-----------+----------+--------------+ PERO     Full                                                        +---------+---------------+---------+-----------+----------+--------------+   +---------+---------------+---------+-----------+----------+--------------+  LEFT     CompressibilityPhasicitySpontaneityPropertiesThrombus Aging +---------+---------------+---------+-----------+----------+--------------+ CFV      Full           Yes      Yes                                 +---------+---------------+---------+-----------+----------+--------------+ SFJ      Full                                                        +---------+---------------+---------+-----------+----------+--------------+ FV Prox  Full                                                        +---------+---------------+---------+-----------+----------+--------------+ FV Mid   Full                                                        +---------+---------------+---------+-----------+----------+--------------+ FV DistalFull                                                        +---------+---------------+---------+-----------+----------+--------------+ PFV      Full                                                        +---------+---------------+---------+-----------+----------+--------------+ POP      Full           Yes      Yes                                 +---------+---------------+---------+-----------+----------+--------------+ PTV      Full                                                         +---------+---------------+---------+-----------+----------+--------------+ PERO     Full                                                        +---------+---------------+---------+-----------+----------+--------------+     Summary: BILATERAL: - No evidence of deep vein thrombosis seen in the lower extremities, bilaterally. -No evidence of popliteal cyst, bilaterally. 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   *See table(s) above for measurements and  observations. Electronically signed by Curt Jews MD on 06/04/2020 at 5:05:07 PM.    Final

## 2020-06-05 NOTE — TOC Initial Note (Signed)
Transition of Care [facility]) - Initial/Assessment Note    Patient Details  Name: Alyssa [name] MRN: [id number] Date of Birth: [birth year]  Transition of Care [facility]) CM/SW Contact:    Sherrilyn Rist Transition of Care Phone Number: [phone] 06/05/2020, 10:11 AM  Clinical Narrative:                 Patient lives at home with spouse, he continues to take her to her MD apts; PCP is Dr Posey Pronto; has private insurance with [facility] with prescription drug coverage; pharmacy of choice [facility] [address]; DME- use a walker at home. 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   Expected Discharge Plan: [place] Barriers to Discharge: No Barriers Identified   Patient Goals and CMS Choice        Expected Discharge Plan and Services Expected Discharge Plan: [place]   Discharge Planning Services: CM Consult                                          Prior Living Arrangements/Services   Lives with:: Spouse Patient language and need for interpreter reviewed:: No        Need for Family Participation in Patient Care: Yes (Comment) Care giver support system in place?: Yes (comment)   Criminal Activity/Legal Involvement Pertinent to Current Situation/Hospitalization: No - Comment as needed  Activities of Daily Living Home Assistive Devices/Equipment: Blood pressure cuff, Cane (specify quad or straight), Electric scooter, Shower chair with back, Grab bars in shower, Grab bars around toilet, Long-handled shoehorn, Hand-held shower hose, Scales, Environmental consultant (specify type), Wheelchair, Hospital bed, Nebulizer ADL Screening (condition at time of admission) Patient's cognitive ability adequate to safely complete daily activities?: Yes Is the patient deaf or have difficulty hearing?: No Does the patient have  difficulty seeing, even when wearing glasses/contacts?: No Does the patient have difficulty concentrating, remembering, or making decisions?: Yes (forgetful at times) Patient able to express need for assistance with ADLs?: Yes Does the patient have difficulty dressing or bathing?: No Independently performs ADLs?: Yes (appropriate for developmental age) Does the patient have difficulty walking or climbing stairs?: No (unable) Weakness of Legs: Both Weakness of Arms/Hands: Both  Permission Sought/Granted Permission sought to share information with : Case Manager Permission granted to share information with : Yes, Verbal Permission Granted  Share Information with NAME: [name]  Permission granted to share info w AGENCY: [place] agencies/ DME        Emotional Assessment              Admission diagnosis:  Shortness of breath [R06.02] Hypoxia [R09.02] AKI (acute kidney injury) ([place]) [N17.9] Elevated d-dimer [R79.89] Acute respiratory failure due to COVID-19 ([place]) [U07.1, J96.00] COVID-19 [U07.1] Patient Active Problem List   Diagnosis Date Noted  . Acute respiratory failure due to COVID-19 ([place]) 06/02/2020  . Acute renal failure superimposed on chronic kidney disease ([place]) 02/06/2020  . Leukocytosis 02/06/2020  . Thrombocytosis ([place]) 02/06/2020  . Palliative care encounter   . Constipation 01/05/2020  . Malnutrition of moderate degree ([place]) 01/04/2020  . Acute on chronic renal failure ([place]) 01/04/2020  . Hyperkalemia 01/03/2020  . AKI (acute kidney injury) ([place]) 01/03/2020  . Acute respiratory failure  with hypoxia ([place]) 11/01/2019  . UTI (urinary tract infection) 11/01/2019  . CAP (community acquired pneumonia) 11/01/2019  . Acute cystitis without hematuria   . Encephalopathy 09/18/2019  . SAH (subarachnoid hemorrhage) ([place]) 09/18/2019  . Dementia ([place]) 09/18/2019  . Metabolic acidosis 16/07/9603  . ARF (acute renal failure) ([place]) 09/17/2019  . Acute encephalopathy  09/17/2019  . Abnormal CT scan, colon   . Diarrhea   . Diverticulitis 06/07/2019  . Depression with anxiety 06/07/2019  . DNR (do not resuscitate) 03/23/2019  . Volume overload 03/23/2019  . Edema 03/23/2019  . Acute renal failure (ARF) ([place]) 03/08/2019  . Hypokalemia 03/08/2019  . Palliative care by specialist   . Goals of care, counseling/discussion   . Altered mental status   . Encephalopathy, toxic 12/24/2018  . Aspiration pneumonia of both lower lobes due to gastric secretions ([place]) 12/24/2018  . Non-ST elevation (NSTEMI) myocardial infarction ([place])   . Chest pain   . Dyspnea on exertion   . Elevated troponin level 12/09/2018  . Chronic migraine 03/27/2018  . Lumbar radiculopathy 03/27/2018  . Gait abnormality 03/27/2018  . CKD (chronic kidney disease) stage 4, GFR 15-29 ml/min (HCC) 12/28/2016  . Symptomatic anemia 12/28/2016  . Chronic pain syndrome 12/28/2016  . Benzodiazepine withdrawal without complication ([place]) 54/06/8118  . Chronic right shoulder pain 12/06/2016  . Fall 09/17/2016  . Fracture of humeral head, closed, right, initial encounter 09/17/2016  . Rib fractures 09/17/2016  . Multiple falls 09/17/2016  . Severe muscle deconditioning 09/17/2016  . Failure to thrive in adult 09/17/2016  . Melena 04/25/2016  . Transient hypotension 04/25/2016  . Anxiety state 04/25/2016  . Tobacco abuse 04/25/2016  . Hyperlipidemia 04/25/2016  . Syncope 04/24/2016  . Near syncope 04/24/2016  . Gait difficulty 01/12/2016  . Foot drop, left 01/12/2016  . Severe recurrent major depression without psychotic features ([place]) 08/28/2015  . Spondylolisthesis at L4-L5 level 07/30/2015  . PVD (peripheral vascular disease) ([place]) 07/29/2014  . Weakness-Bilateral arm/leg 07/29/2014  . Numbness-left leg 07/29/2014  . Swelling of limb-Legs 07/29/2014  . Atherosclerosis of native arteries of the extremities with intermittent claudication 09/30/2011   PCP:  [name], DO Pharmacy:    [facility] [place], [place] [address] [place] [address] Phone: [phone] Fax: [place], [place] [address] [place] [address] Phone: [phone] Fax: [phone]     Social Determinants of Health ([place]) Interventions    Readmission Risk Interventions Readmission Risk Prevention Plan 12/26/2018  Transportation Screening Complete  Medication Review (RN Care Manager) Complete  PCP or Specialist appointment within 3-5 days of discharge Not Complete  PCP/Specialist Appt Not Complete comments pt for snf. Snf MD to see as needed.  [place] or Home Care Consult Not Complete  [place]RI or Home Care Consult Pt Refusal Comments NA  SW Recovery Care/Counseling Consult Not Complete  SW Consult Not Complete Comments NA  Palliative Care Screening Complete  Skilled Nursing Facility Complete  Some recent data might be hidden

## 2020-06-05 NOTE — Evaluation (Addendum)
Occupational Therapy Evaluation Patient Details Name: Alyssa [name] MRN: [id number] DOB: 1941-03-05 Today's Date: 06/05/2020    History of Present Illness Pt adm with acute hypoxic respiratory failure due to copd exacerbation and covid PNA. PMH - copd, chronic pain, ckd, HTN, rt THR, dementia   Clinical Impression   PTA, pt was living with her husband and was performing ADLs and using rollator for functional mobility. Pt currently performing ADLs and functional mobility with RW at [place] level. Pt presenting with decreased activity tolerance requiring rest breaks throughout. SpO2 >90% on RA. Pt would benefit from further acute OT to facilitate safe dc. Recommend dc to home with initial 24/7 support once medically stable per physician.      Follow Up Recommendations  No OT follow up;Supervision/Assistance - 24 hour (HHPT)    Equipment Recommendations  None recommended by OT    Recommendations for Other Services PT consult     Precautions / Restrictions Precautions Precautions: Fall      Mobility Bed Mobility Overal bed mobility: Modified Independent Bed Mobility: Supine to Sit;Sit to Supine     Supine to sit: Modified independent (Device/Increase time) Sit to supine: Modified independent (Device/Increase time)   General bed mobility comments: Increased time due to increased effort  Transfers Overall transfer level: Needs assistance Equipment used: Rolling walker (2 wheeled) Transfers: Sit to/from Stand Sit to Stand: Min guard         General transfer comment: Min Guard A for safety    Balance Overall balance assessment: Needs assistance Sitting-balance support: No upper extremity supported;Feet supported Sitting balance-Leahy Scale: Fair     Standing balance support: No upper extremity supported;During functional activity Standing balance-Leahy Scale: Fair Standing balance comment: able to perform hand hygiene at sink without UE support                            ADL either performed or assessed with clinical judgement   ADL Overall ADL's : Needs assistance/impaired Eating/Feeding: Set up;Sitting   Grooming: Wash/dry hands;Min guard;Standing   Upper Body Bathing: Supervision/ safety;Set up;Sitting   Lower Body Bathing: Min guard;Sit to/from stand   Upper Body Dressing : Supervision/safety;Set up;Sitting   Lower Body Dressing: Min guard;Sit to/from stand   Toilet Transfer: Min guard;Ambulation;BSC;RW   Toileting- Clothing Manipulation and Hygiene: Supervision/safety;Sitting/lateral lean       Functional mobility during ADLs: Min guard;Rolling walker General ADL Comments: Pt presenting with decreased activity tolerance requiring reat breaks throughout toileting and functional mobility      Vision Baseline Vision/History: Wears glasses Wears Glasses: Reading only Patient Visual Report: No change from baseline       Perception     Praxis      Pertinent Vitals/Pain Pain Assessment: 0-10 Pain Score: 8  Faces Pain Scale: Hurts even more Pain Location: head Pain Descriptors / Indicators: Aching Pain Intervention(s): Monitored during session;Limited activity within patient's tolerance;Repositioned     Hand Dominance Right   Extremity/Trunk Assessment Upper Extremity Assessment Upper Extremity Assessment: Generalized weakness   Lower Extremity Assessment Lower Extremity Assessment: Defer to PT evaluation RLE Deficits / Details: shortened leg   Cervical / Trunk Assessment Cervical / Trunk Assessment: Kyphotic   Communication Communication Communication: HOH   Cognition Arousal/Alertness: Awake/alert Behavior During Therapy: WFL for tasks assessed/performed Overall Cognitive Status: No family/caregiver present to determine baseline cognitive functioning  General Comments: Required verbal cues    General Comments  SpO2 >90% on RA  throughout session    Exercises     Shoulder Instructions      Home Living Family/patient expects to be discharged to:: Private residence Living Arrangements: Spouse/significant other Available Help at Discharge: Available 24 hours/day ([employer] other) Type of Home: House Home Access: Stairs to enter [employer] of Steps: 3 Entrance Stairs-Rails: Right;Left;Can reach both Home Layout: One level     Bathroom Shower/Tub: Teacher, early years/pre: Handicapped height Bathroom Accessibility: Yes   Home Equipment: Environmental consultant - 2 wheels;Walker - 4 wheels;Tub bench   Additional Comments: 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      Prior Functioning/Environment Level of Independence: Independent with assistive device(s)        Comments: reports using a Rollator for mobility. Does her own ADLs and friend helps with IADLs/driving.        OT Problem List: Decreased strength;Decreased range of motion;Decreased activity tolerance;Impaired balance (sitting and/or standing);Decreased knowledge of use of DME or AE;Decreased knowledge of precautions;Cardiopulmonary status limiting activity      OT Treatment/Interventions: Self-care/ADL training;Therapeutic exercise;DME and/or AE instruction;Energy conservation;Therapeutic activities;Patient/family education    OT Goals(Current goals can be found in the care plan section) Acute Rehab OT Goals Patient Stated Goal: Return home OT Goal Formulation: With patient Time For Goal Achievement: 06/19/20 Potential to Achieve Goals: Good  OT Frequency: Min 2X/week   Barriers to D/C:            Co-evaluation              AM-PAC OT "6 Clicks" Daily Activity     Outcome Measure Help from another person eating meals?: None Help from another person taking care of personal grooming?: A Little Help from another person toileting, which includes using toliet, bedpan, or urinal?: A Little Help  from another person bathing (including washing, rinsing, drying)?: A Little Help from another person to put on and taking off regular upper body clothing?: A Little Help from another person to put on and taking off regular lower body clothing?: A Little 6 Click Score: 19   End of Session Equipment Utilized During Treatment: Rolling walker Nurse Communication: Mobility status  Activity Tolerance: Patient tolerated treatment well Patient left: in bed;with call bell/phone within reach;with bed alarm set  OT Visit Diagnosis: Unsteadiness on feet (R26.81);Other abnormalities of gait and mobility (R26.89);Muscle weakness (generalized) (M62.81)                Time: 9201-0071 OT Time Calculation (min): 18 min Charges:  OT General Charges $OT Visit: 1 Visit OT Evaluation $OT Eval Moderate Complexity: [place], OTR/L Acute Rehab Pager: [phone] Office: [place] 06/05/2020, 5:22 PM

## 2020-06-05 NOTE — Evaluation (Signed)
Physical Therapy Evaluation Patient Details Name: Alyssa [name] MRN: [id number] DOB: [birth year] Today's Date: 06/05/2020   History of Present Illness  Pt adm with acute hypoxic respiratory failure due to copd exacerbation and covid PNA. PMH - copd, chronic pain, ckd, HTN, rt THR, dementia  Clinical Impression  Pt admitted with above diagnosis and presents to PT with functional limitations due to deficits listed below (See PT problem list). Pt needs skilled PT to maximize independence and safety to allow discharge to home if husband can provide assist. If husband/family unable to assist may need ST-SNF.      Follow Up Recommendations Home health PT;Supervision/Assistance - 24 hour (If husband/family unable to assist may need SNF)    Equipment Recommendations  None recommended by PT    Recommendations for Other Services       Precautions / Restrictions Precautions Precautions: Fall      Mobility  Bed Mobility Overal bed mobility: Modified Independent Bed Mobility: Supine to Sit;Sit to Supine     Supine to sit: Modified independent (Device/Increase time) Sit to supine: Modified independent (Device/Increase time)      Transfers Overall transfer level: Needs assistance Equipment used: 4-wheeled walker Transfers: Sit to/from Stand Sit to Stand: Min assist         General transfer comment: Assist for balance with coming to stand   Ambulation/Gait Ambulation/Gait assistance: Min assist Gait Distance (Feet): 20 Feet Assistive device: 4-wheeled walker Gait Pattern/deviations: Step-through pattern;Decreased stride length;Trendelenburg Gait velocity: decr Gait velocity interpretation: <1.8 ft/sec, indicate of risk for recurrent falls General Gait Details: Assist for balance and support. Pt fatigued quickly and hurried back to bed to sit down.   Stairs            Wheelchair Mobility    Modified Rankin (Stroke Patients Only)       Balance Overall  balance assessment: Needs assistance Sitting-balance support: No upper extremity supported;Feet supported Sitting balance-Leahy Scale: Fair     Standing balance support: Bilateral upper extremity supported;During functional activity Standing balance-Leahy Scale: Poor Standing balance comment: rollator and min assist for static standing. Pt tremulous                             Pertinent Vitals/Pain Pain Assessment: Faces Faces Pain Scale: Hurts even more Pain Location: head Pain Descriptors / Indicators: Aching Pain Intervention(s): Limited activity within patient's tolerance;Premedicated before session    Home Living Family/patient expects to be discharged to:: Private residence Living Arrangements: Spouse/significant other Available Help at Discharge: Family;Available 24 hours/day Type of Home: House Home Access: Stairs to enter Entrance Stairs-Rails: Right;Left;Can reach both Entrance Stairs-Number of Steps: 3 Home Layout: One level Home Equipment: Walker - 2 wheels;Walker - 4 wheels;Shower seat Additional Comments: 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    Prior Function Level of Independence: Independent with assistive device(s)         Comments: reports using a Rollator for mobility. Does her own ADLs and friend helps with IADLs/driving.     Hand Dominance   Dominant Hand: Right    Extremity/Trunk Assessment   Upper Extremity Assessment Upper Extremity Assessment: Defer to OT evaluation    Lower Extremity Assessment Lower Extremity Assessment: RLE deficits/detail;Generalized weakness RLE Deficits / Details: shortened leg       Communication   Communication: HOH  Cognition Arousal/Alertness: Awake/alert Behavior During Therapy: WFL for tasks assessed/performed Overall Cognitive Status: No family/caregiver present  to determine baseline cognitive functioning                                 General  Comments: Required verbal cues       General Comments General comments (skin integrity, edema, etc.): Pt on RA with SpO2 > 92%. Dyspnea 2-3/4 with activity    Exercises     Assessment/Plan    PT Assessment Patient needs continued PT services  PT Problem List Decreased strength;Decreased activity tolerance;Decreased balance;Decreased mobility;Cardiopulmonary status limiting activity       PT Treatment Interventions DME instruction;Gait training;Functional mobility training;Stair training;Therapeutic activities;Therapeutic exercise;Balance training;Patient/family education    PT Goals (Current goals can be found in the Care Plan section)  Acute Rehab PT Goals Patient Stated Goal: go home PT Goal Formulation: With patient Time For Goal Achievement: 06/19/20 Potential to Achieve Goals: Good    Frequency Min 3X/week   Barriers to discharge Inaccessible home environment stairs to enter    Co-evaluation               AM-PAC PT "6 Clicks" Mobility  Outcome Measure Help needed turning from your back to your side while in a flat bed without using bedrails?: None Help needed moving from lying on your back to sitting on the side of a flat bed without using bedrails?: None Help needed moving to and from a bed to a chair (including a wheelchair)?: A Little Help needed standing up from a chair using your arms (e.g., wheelchair or bedside chair)?: A Little Help needed to walk in hospital room?: A Little Help needed climbing 3-5 steps with a railing? : A Lot 6 Click Score: 19    End of Session Equipment Utilized During Treatment: Gait belt Activity Tolerance: Patient limited by fatigue Patient left: in bed;with call bell/phone within reach;with bed alarm set   PT Visit Diagnosis: Unsteadiness on feet (R26.81);Other abnormalities of gait and mobility (R26.89);Muscle weakness (generalized) (M62.81)    Time: 0388-8280 PT Time Calculation (min) (ACUTE ONLY): 21 min   Charges:    PT Evaluation $PT Eval Moderate Complexity: [place] Pager [phone] Office [place] 06/05/2020, 3:01 PM

## 2020-06-05 NOTE — Plan of Care (Signed)
  Problem: Education: Goal: Knowledge of General Education information will improve Description: Including pain rating scale, medication(s)/side effects and non-pharmacologic comfort measures Outcome: Progressing   Problem: Clinical Measurements: Goal: Respiratory complications will improve Outcome: Not Progressing Goal: Cardiovascular complication will be avoided Outcome: Not Progressing

## 2020-06-06 LAB — COMPREHENSIVE METABOLIC PANEL
ALT: 19 U/L (ref 0–44)
AST: 28 U/L (ref 15–41)
Albumin: 2.5 g/dL — ABNORMAL LOW (ref 3.5–5.0)
Alkaline Phosphatase: 69 U/L (ref 38–126)
Anion gap: 15 (ref 5–15)
BUN: 90 mg/dL — ABNORMAL HIGH (ref 8–23)
CO2: 22 mmol/L (ref 22–32)
Calcium: 8.3 mg/dL — ABNORMAL LOW (ref 8.9–10.3)
Chloride: 98 mmol/L (ref 98–111)
Creatinine, Ser: 3.43 mg/dL — ABNORMAL HIGH (ref 0.44–1.00)
GFR calc Af Amer: 14 mL/min — ABNORMAL LOW (ref >60)
GFR calc non Af Amer: 12 mL/min — ABNORMAL LOW (ref >60)
Glucose, Bld: 202 mg/dL — ABNORMAL HIGH (ref 70–99)
Potassium: 3.7 mmol/L (ref 3.5–5.1)
Sodium: 135 mmol/L (ref 135–145)
Total Bilirubin: 0.4 mg/dL (ref 0.3–1.2)
Total Protein: 5.4 g/dL — ABNORMAL LOW (ref 6.5–8.1)

## 2020-06-06 LAB — PREPARE RBC (CROSSMATCH)

## 2020-06-06 LAB — CBC WITH DIFFERENTIAL/PLATELET
Abs Immature Granulocytes: 0.17 10*3/uL — ABNORMAL HIGH (ref 0.00–0.07)
Basophils Absolute: 0 10*3/uL (ref 0.0–0.1)
Basophils Relative: 0 %
Eosinophils Absolute: 0 10*3/uL (ref 0.0–0.5)
Eosinophils Relative: 0 %
HCT: 19.1 % — ABNORMAL LOW (ref 36.0–46.0)
Hemoglobin: 6.7 g/dL — CL (ref 12.0–15.0)
Immature Granulocytes: 2 %
Lymphocytes Relative: 9 %
Lymphs Abs: 1 10*3/uL (ref 0.7–4.0)
MCH: 28.6 pg (ref 26.0–34.0)
MCHC: 35.1 g/dL (ref 30.0–36.0)
MCV: 81.6 fL (ref 80.0–100.0)
Monocytes Absolute: 0.4 10*3/uL (ref 0.1–1.0)
Monocytes Relative: 4 %
Neutro Abs: 10.1 10*3/uL — ABNORMAL HIGH (ref 1.7–7.7)
Neutrophils Relative %: 85 %
Platelets: 331 10*3/uL (ref 150–400)
RBC: 2.34 MIL/uL — ABNORMAL LOW (ref 3.87–5.11)
RDW: 14.5 % (ref 11.5–15.5)
WBC: 11.7 10*3/uL — ABNORMAL HIGH (ref 4.0–10.5)
nRBC: 1.6 % — ABNORMAL HIGH (ref 0.0–0.2)

## 2020-06-06 LAB — GLUCOSE, CAPILLARY
Glucose-Capillary: 269 mg/dL — ABNORMAL HIGH (ref 70–99)
Glucose-Capillary: 191 mg/dL — ABNORMAL HIGH (ref 70–99)

## 2020-06-06 LAB — MAGNESIUM: Magnesium: 1.7 mg/dL (ref 1.7–2.4)

## 2020-06-06 LAB — D-DIMER, QUANTITATIVE: D-Dimer, Quant: 3.7 ug/mL-FEU — ABNORMAL HIGH (ref 0.00–0.50)

## 2020-06-06 LAB — FERRITIN: Ferritin: 2215 ng/mL — ABNORMAL HIGH (ref 11–307)

## 2020-06-06 LAB — PHOSPHORUS: Phosphorus: 3.7 mg/dL (ref 2.5–4.6)

## 2020-06-06 LAB — C-REACTIVE PROTEIN: CRP: 2.8 mg/dL — ABNORMAL HIGH (ref <1.0)

## 2020-06-06 MED ORDER — PREDNISONE 20 MG PO TABS
40.0000 mg | ORAL_TABLET | Freq: Every day | ORAL | Status: DC
  Administered 2020-06-07: 40 mg via ORAL
  Filled 2020-06-06: qty 2

## 2020-06-06 MED ORDER — INSULIN ASPART 100 UNIT/ML ~~LOC~~ SOLN
0.0000 [IU] | Freq: Three times a day (TID) | SUBCUTANEOUS | Status: DC
  Administered 2020-06-06 – 2020-06-07 (×2): 5 [IU] via SUBCUTANEOUS

## 2020-06-06 MED ORDER — DIPHENHYDRAMINE HCL 50 MG/ML IJ SOLN
25.0000 mg | Freq: Once | INTRAMUSCULAR | Status: AC
  Administered 2020-06-06: 25 mg via INTRAVENOUS
  Filled 2020-06-06: qty 1

## 2020-06-06 MED ORDER — ACETAMINOPHEN 325 MG PO TABS
650.0000 mg | ORAL_TABLET | Freq: Once | ORAL | Status: AC
  Administered 2020-06-06: 650 mg via ORAL
  Filled 2020-06-06: qty 2

## 2020-06-06 MED ORDER — DARBEPOETIN ALFA 60 MCG/0.3ML IJ SOSY
60.0000 ug | PREFILLED_SYRINGE | Freq: Once | INTRAMUSCULAR | Status: AC
  Administered 2020-06-06: 60 ug via INTRAVENOUS
  Filled 2020-06-06: qty 0.3

## 2020-06-06 MED ORDER — SODIUM CHLORIDE 0.9% IV SOLUTION: Freq: Once | INTRAVENOUS | Status: AC

## 2020-06-06 NOTE — TOC Progression Note (Signed)
Transition of Care [facility]) - Progression Note    Patient Details  Name: Alyssa [name] MRN: [id number] Date of Birth: 10-Jan-1941  Transition of Care [facility]) CM/SW [place], RN Phone Number: 06/06/2020, 10:02 AM  Clinical Narrative:     Patient active with [facility] for services Spoke to patient, wants to continue with [facility]. Patient is off oxygen and doing well. Will continue to follow for any furhter needs.  Expected Discharge Plan: [place] Barriers to Discharge: No Barriers Identified  Expected Discharge Plan and Services Expected Discharge Plan: [place]   Discharge Planning Services: CM Consult                               [facility] Agency: [place] Date [facility] Agency Contacted: 06/06/20 Time HH Agency Contacted: 0930 Representative spoke with at [place]: Adela Lank patient active with [facility]   Social Determinants of Health (SDOH) Interventions    Readmission Risk Interventions Readmission Risk Prevention Plan 12/26/2018  Transportation Screening Complete  Medication Review (RN Care Manager) Complete  PCP or Specialist appointment within 3-5 days of discharge Not Complete  PCP/Specialist Appt Not Complete comments pt for snf. Snf MD to see as needed.  [place] or Home Care Consult Not Complete  HRI or Home Care Consult Pt Refusal Comments NA  SW Recovery Care/Counseling Consult Not Complete  SW Consult Not Complete Comments NA  Palliative Care Screening Complete  Skilled Nursing Facility Complete  Some recent data might be hidden

## 2020-06-06 NOTE — Progress Notes (Signed)
Nephrology Follow-Up Consult note   Assessment/Recommendations: Alyssa [name] is a/an 79 y.o. female with a past medical history significant for chronic kidney disease, hypertension presenting with Covid, UTI, AKI on CKD  AKI on CKD: Likely secondary to dehydration, possible tubular injury, urinary tract infection.  Baseline kidney function fluctuates but likely 2.2-3.  Creatinine 7.4 and now 3.4 today after hydration and allowing for renal recovery. -Continue oral hydration -Continue to monitor daily Cr while inpatient, Dose meds for GFR -Monitor Daily I/Os, Daily weight  -Maintain MAP>65 for optimal renal perfusion.  -Avoid nephrotoxic medications including NSAIDs and Vanc/Zosyn combo; I made Voltaren gel as needed instead of scheduled -No signs of urinary obstruction on CT abdomen pelvis  -No dialysis at this time  Non-anion gap metabolic acidosis: Related to AKI. Bicarb back to normal. We will stop oral bicarbonate at this time. Can be restarted if needed.  Volume Status: Appears euvolemic. Encourage p.o. hydration  E. coli urinary tract infection: Sensitivities back and ceftriaxone is appropriate.  Covid positive: Has received vaccination.  Seems to have a relatively benign course.  Management per primary team.  Anemia: Likely multifactorial. 1 unit of PRBCs today per primary team.  We will sign off at this time given the patient's significant improvement. Please notify us if the patient worsens or needs more help in the future.   Recommendations conveyed to primary service.    [facility] 06/06/2020 1:07 PM  ___________________________________________________________  CC: AKI on CKD  Interval History/Subjective: She states that she feels well today. Her urine output continues to be good. She denies any significant complaints. Hemoglobin 6.7 today. Creatinine improved to 3.4.   Medications:  Current Facility-Administered  Medications  Medication Dose Route Frequency Provider Last Rate Last Admin  . (feeding supplement) PROSource Plus liquid 30 mL  30 mL Oral BID BM Jonetta Osgood, MD   30 mL at 06/06/20 0951  . acetaminophen (TYLENOL) tablet 650 mg  650 mg Oral Q6H PRN Elwyn Reach, MD   650 mg at 06/03/20 2306  . albuterol (VENTOLIN HFA) 108 (90 Base) MCG/ACT inhaler 2 puff  2 puff Inhalation Q6H PRN Elwyn Reach, MD   2 puff at 06/06/20 0319  . amLODipine (NORVASC) tablet 7.5 mg  7.5 mg Oral Daily Gala Romney L, MD   7.5 mg at 06/06/20 0950  . aspirin EC tablet 81 mg  81 mg Oral Daily Elwyn Reach, MD   81 mg at 06/06/20 0951  . busPIRone (BUSPAR) tablet 15 mg  15 mg Oral TID PRN Gala Romney L, MD      . carvedilol (COREG) tablet 12.5 mg  12.5 mg Oral BID WC Gala Romney L, MD   12.5 mg at 06/06/20 0950  . cephALEXin (KEFLEX) capsule 500 mg  500 mg Oral Q24H Jonetta Osgood, MD   500 mg at 06/05/20 1651  . chlorpheniramine-HYDROcodone (TUSSIONEX) 10-8 MG/5ML suspension 5 mL  5 mL Oral Q12H PRN Gala Romney L, MD      . clopidogrel (PLAVIX) tablet 75 mg  75 mg Oral Daily Elwyn Reach, MD   75 mg at 06/06/20 0951  . diclofenac Sodium (VOLTAREN) 1 % topical gel 2 g  2 g Topical TID PRN Reesa Chew, MD   2 g at 06/04/20 2020  . donepezil (ARICEPT) tablet 5 mg  5 mg Oral Daily Elwyn Reach, MD   5 mg at 06/06/20 0953  . DULoxetine (CYMBALTA) DR capsule  30 mg  30 mg Oral Daily Elwyn Reach, MD   30 mg at 06/06/20 0950  . feeding supplement (ENSURE ENLIVE) (ENSURE ENLIVE) liquid 237 mL  237 mL Oral BID BM Gala Romney L, MD   237 mL at 06/06/20 0952  . ferrous sulfate tablet 325 mg  325 mg Oral Q breakfast Elwyn Reach, MD   325 mg at 06/06/20 0951  . gabapentin (NEURONTIN) capsule 100 mg  100 mg Oral TID Elwyn Reach, MD   100 mg at 06/06/20 0952  . guaiFENesin-dextromethorphan (ROBITUSSIN DM) 100-10 MG/5ML syrup 10 mL  10 mL Oral Q4H PRN Elwyn Reach, MD      . heparin injection 5,000 Units  5,000 Units Subcutaneous Q8H Jonetta Osgood, MD   5,000 Units at 06/06/20 [phone]  . hydrALAZINE (APRESOLINE) injection 5 mg  5 mg Intravenous Q6H PRN Elgergawy, Silver Huguenin, MD      . Ipratropium-Albuterol (COMBIVENT) respimat 1 puff  1 puff Inhalation Q6H Elwyn Reach, MD   1 puff at 06/06/20 0952  . MEDLINE mouth rinse  15 mL Mouth Rinse BID Gala Romney L, MD   15 mL at 06/06/20 0953  . methylPREDNISolone sodium succinate (SOLU-MEDROL) 40 mg/mL injection 40 mg  40 mg Intravenous Q12H Elgergawy, Silver Huguenin, MD   40 mg at 06/06/20 0950  . metoprolol tartrate (LOPRESSOR) injection 5 mg  5 mg Intravenous Q6H PRN Elwyn Reach, MD   5 mg at 06/03/20 0051  . multivitamin with minerals tablet 1 tablet  1 tablet Oral Daily Elwyn Reach, MD   1 tablet at 06/06/20 0950  . nitroGLYCERIN (NITROSTAT) SL tablet 0.4 mg  0.4 mg Sublingual Q5 min PRN Elwyn Reach, MD   0.4 mg at 06/05/20 0404  . ondansetron (ZOFRAN) tablet 4 mg  4 mg Oral Q6H PRN Elwyn Reach, MD       Or  . ondansetron (ZOFRAN) injection 4 mg  4 mg Intravenous Q6H PRN Gala Romney L, MD      . pantoprazole (PROTONIX) EC tablet 40 mg  40 mg Oral Daily Elwyn Reach, MD   40 mg at 06/06/20 0951  . rosuvastatin (CRESTOR) tablet 40 mg  40 mg Oral Daily Elwyn Reach, MD   40 mg at 06/06/20 0950  . sodium bicarbonate tablet 1,300 mg  1,300 mg Oral BID Elwyn Reach, MD   1,300 mg at 06/06/20 0950      Review of Systems: 10 systems reviewed and negative except per interval history/subjective  Physical Exam: Vitals:   06/06/20 0749 06/06/20 1245  BP: 118/83 129/82  Pulse: 84 73  Resp: 16 18  Temp: 98.6 F (37 C) 98.7 F (37.1 C)  SpO2: 98% 98%   No intake/output data recorded.  Intake/Output Summary (Last 24 hours) at 06/06/2020 1307 Last data filed at 06/05/2020 1700 Gross per 24 hour  Intake --  Output 400 ml  Net -400 ml    Constitutional: Pleasant, sitting in chair, no apparent distress ENMT: ears and nose without scars or lesions, MMM CV: Tachycardia, no obvious murmur Respiratory: Bilateral chest rise, no increased work of breathing Gastrointestinal: soft, non-tender, no palpable masses or hernias Skin: no visible lesions or rashes Psych: alert, judgement/insight appropriate, appropriate mood and affect   Test Results I personally reviewed new and old clinical labs and radiology tests Lab Results  Component Value Date   NA 135 06/06/2020   K 3.7 06/06/2020  CL 98 06/06/2020   CO2 22 06/06/2020   BUN 90 (H) 06/06/2020   CREATININE 3.43 (H) 06/06/2020   CALCIUM 8.3 (L) 06/06/2020   ALBUMIN 2.5 (L) 06/06/2020   PHOS 3.7 06/06/2020

## 2020-06-06 NOTE — Progress Notes (Signed)
Physical Therapy Treatment Patient Details Name: Alyssa [name] MRN: [id number] DOB: [birth year] Today's Date: 06/06/2020    History of Present Illness Pt adm with acute hypoxic respiratory failure due to copd exacerbation and covid PNA. PMH - copd, chronic pain, ckd, HTN, rt THR, dementia    PT Comments    Pt making good progress. Should be able to return home with family.    Follow Up Recommendations  Home health PT;Supervision for mobility/OOB     Equipment Recommendations  None recommended by PT    Recommendations for Other Services       Precautions / Restrictions Precautions Precautions: Fall    Mobility  Bed Mobility Overal bed mobility: Modified Independent Bed Mobility: Supine to Sit;Sit to Supine     Supine to sit: Modified independent (Device/Increase time) Sit to supine: Modified independent (Device/Increase time)      Transfers Overall transfer level: Needs assistance Equipment used: 4-wheeled walker Transfers: Sit to/from Stand Sit to Stand: Supervision         General transfer comment: supervision for safety and lines  Ambulation/Gait Ambulation/Gait assistance: Min guard Gait Distance (Feet): 110 Feet Assistive device: 4-wheeled walker Gait Pattern/deviations: Step-through pattern;Decreased stride length;Trendelenburg Gait velocity: decr Gait velocity interpretation: 1.31 - 2.62 ft/sec, indicative of limited community ambulator General Gait Details: Assist for safety. Pt without her built up shoe which causes gait abnormalities. Pt with UE tremors on walker which she reports is baseline.    Stairs             Wheelchair Mobility    Modified Rankin (Stroke Patients Only)       Balance Overall balance assessment: Needs assistance Sitting-balance support: No upper extremity supported;Feet supported Sitting balance-Leahy Scale: Fair     Standing balance support: Bilateral upper extremity supported;During functional  activity Standing balance-Leahy Scale: Poor Standing balance comment: rollator and supervision for static stand                            Cognition Arousal/Alertness: Awake/alert Behavior During Therapy: WFL for tasks assessed/performed Overall Cognitive Status: Within Functional Limits for tasks assessed                                        Exercises      General Comments General comments (skin integrity, edema, etc.): On RA with SpO2 > 90%      Pertinent Vitals/Pain Pain Assessment: No/denies pain    Home Living                      Prior Function            PT Goals (current goals can now be found in the care plan section) Acute Rehab PT Goals Patient Stated Goal: go home Progress towards PT goals: Progressing toward goals    Frequency    Min 3X/week      PT Plan Current plan remains appropriate    Co-evaluation              AM-PAC PT "6 Clicks" Mobility   Outcome Measure  Help needed turning from your back to your side while in a flat bed without using bedrails?: None Help needed moving from lying on your back to sitting on the side of a flat bed without using bedrails?: None Help needed moving to and from  a bed to a chair (including a wheelchair)?: A Little Help needed standing up from a chair using your arms (e.g., wheelchair or bedside chair)?: A Little Help needed to walk in hospital room?: A Little Help needed climbing 3-5 steps with a railing? : A Lot 6 Click Score: 19    End of Session   Activity Tolerance: Patient tolerated treatment well Patient left: in bed;with call bell/phone within reach;with bed alarm set   PT Visit Diagnosis: Unsteadiness on feet (R26.81);Other abnormalities of gait and mobility (R26.89);Muscle weakness (generalized) (M62.81)     Time: 1130-1145 PT Time Calculation (min) (ACUTE ONLY): 15 min  Charges:  $Gait Training: 8-22 mins                     [place] Pager [phone] Office [place] 06/06/2020, 1:12 PM

## 2020-06-06 NOTE — Progress Notes (Addendum)
PROGRESS NOTE                                                                                                                                                                                                             Patient Demographics:    Alyssa [name], is a 79 y.o. female, DOB - [birth year], NIO:[id number]  Outpatient Primary MD for the patient is [name], DO   Admit date - 06/02/2020   LOS - 4  Chief Complaint  Patient presents with  . Shortness of Breath  . Loss of Consciousness       Brief Narrative: Patient is a 79 y.o. female with PMHx of COPD, chronic pain syndrome, CKD stage IV, HTN, HLD-who presented to the hospital with chills, cough, poor appetite-she was found to have COVID-19 pneumonia and AKI.  She was subsequently admitted to the hospitalist service.  See below for further details.  COVID-19 vaccinated status: vaccinated  Significant Events: 8/16>> Admit to [facility] for AKI on CKD stage IV, COVID-19 infection.  Significant studies: 8/12>> Echo: EF 55-60% 8/16>>Chest x-ray: No acute process in the chest. 8/16>> CT abdomen/pelvis: Sigmoid diverticulosis, large gastric hernia-lobulated low-attenuation anterior to the expected region of the right common femoral artery-may be vascular in origin 8/17>> chest x-ray: Mild bibasilar infiltrates. 8/17>> normal perfusion scan without evidence of PE. 8/18>> bilateral lower extremity Doppler: No DVT-partial thrombosed pseudoaneurysm in the right groin.  COVID-19 medications: Steroids: 8/16>> Remdesivir: 8/16>>  Antibiotics: Rocephin: 8/16>> 8/17 Zithromax: 8/16>> 8/17 Keflex: 8/18>>  Microbiology data: 8/16>> urine culture: E. coli (resistant to ampicillin/Unasyn)  Procedures: None  Consults: None  DVT prophylaxis: heparin injection 5,000 Units Start: 06/04/20 2200    Subjective:   Slowly improving-on room air this morning-no issues  overnight.   Assessment  & Plan :   Acute hypoxic Resp Failure due to COPD exacerbation and Covid 19 Viral pneumonia: Respiratory failure has resolved-on room air this morning.  Itrated to room air this morning-continue bronchodilators/steroids/remdesivir.    Doubt sepsis present on admission-has been ruled out.  Fever: afebrile O2 requirements:  SpO2: 98 % O2 Flow Rate (L/min): 1 L/min   COVID-19 Labs: Recent Labs    06/04/20 0251 06/05/20 0337  DDIMER 4.94* 3.77*  FERRITIN 693* 2,283*  CRP 6.6* 4.6*       Component Value Date/Time   BNP 90.8  01/02/2020 1430    Recent Labs  Lab 06/03/20 1235  PROCALCITON 1.12    Lab Results  Component Value Date   SARSCOV2NAA POSITIVE (A) 06/02/2020   [place] NEGATIVE 01/06/2020   [place] NEGATIVE 01/02/2020   [place] NEGATIVE 11/01/2019      Prone/Incentive Spirometry: encouraged  incentive spirometry use 3-4/hour  AKI on CKD stage IV: AKI hemodynamically mediated-slowly improving-continue supportive care-nephrology following.  E. coli UTI: Previously on Rocephin-transitioned to Keflex.  Anemia: Secondary to acute illness superimposed on anemia of CKD-worsened by hemodilution.  Denies any melena/hematochezia.  Hemoglobin is trended down to 6.8 today-we will transfuse 1 unit of PRBC.  Spoke with nephrology-they will order Aranesp.  Repeat CBC in a.m.  HTN: BP stable-continue amlodipine  Chronic pain syndrome: Stable-continue Neurontin/Cymbalta.  History of PAD (history of femoropopliteal bypass)/carotid stenosis: Continue ASA/Plavix.  Dementia: Appears to be mild-on Aricept.  Partial thrombosed pseudoaneurysm in the right groin: Discussed with Dr. Donnetta Hutching over the phone-recommendations are for observation/follow-up in the outpatient setting-no intervention needed while inpatient.  Debility/deconditioning: Appears frail-debilitated-not sure what her baseline is-suspect could further worsening from acute  illness-have ordered PT/OT.  Palliative care discussion: Full code.  Per son whom I discussed over the phone on 8/18-no longer being followed by hospice-full code-continue full scope of treatment.  Patient is not on home O2 per son.  Moderate Malnutrition:continue supplements    ABG:    Component Value Date/Time   PHART 7.346 (L) 11/01/2019 0228   PCO2ART 17.6 (LL) 11/01/2019 0228   PO2ART 97.0 11/01/2019 0228   HCO3 10.4 (L) 06/03/2020 1421   TCO2 11 (L) 06/03/2020 1421   ACIDBASEDEF 14.0 (H) 06/03/2020 1421   O2SAT 79.0 06/03/2020 1421    Vent Settings: N/A  Condition - Guarded  Family Communication  : Called son-[name]- at [phone]-unable to leave voicemail as mailbox full on 8/19, 8/20  Code Status :  Full Code  Diet :  Diet Order            Diet Heart Room service appropriate? Yes; Fluid consistency: Thin  Diet effective now                  Disposition Plan  :   Status is: Inpatient  Remains inpatient appropriate because:Inpatient level of care appropriate due to severity of illness   Dispo: The patient is from: Home              Anticipated d/c is to: TBD              Anticipated d/c date is: 1-2 days              Patient currently is not medically stable to d/c.   Barriers to discharge: Resolving renal failure-not yet back to baseline-Anemia requiring PRBC transfusion Antimicorbials  :    Anti-infectives (From admission, onward)   Start     Dose/Rate Route Frequency Ordered Stop   06/04/20 1800  cephALEXin (KEFLEX) capsule 500 mg        500 mg Oral Every 24 hours 06/04/20 1408 06/07/20 1759   06/03/20 1000  remdesivir 100 mg in sodium chloride 0.9 % 100 mL IVPB  Status:  Discontinued       "Followed by" Linked Group Details   100 mg 200 mL/hr over 30 Minutes Intravenous Daily 06/02/20 1938 06/02/20 1954   06/03/20 1000  remdesivir 100 mg in sodium chloride 0.9 % 100 mL IVPB       "Followed by" Linked Group Details  100 mg 200 mL/hr over 30  Minutes Intravenous Every 24 hours 06/02/20 1827 06/07/20 0959   06/02/20 1945  cefTRIAXone (ROCEPHIN) 1 g in sodium chloride 0.9 % 100 mL IVPB  Status:  Discontinued        1 g 200 mL/hr over 30 Minutes Intravenous Every 24 hours 06/02/20 1938 06/04/20 1408   06/02/20 1945  azithromycin (ZITHROMAX) 500 mg in sodium chloride 0.9 % 250 mL IVPB  Status:  Discontinued        500 mg 250 mL/hr over 60 Minutes Intravenous Every 24 hours 06/02/20 1938 06/04/20 1408   06/02/20 1937  remdesivir 200 mg in sodium chloride 0.9% 250 mL IVPB  Status:  Discontinued       "Followed by" Linked Group Details   200 mg 580 mL/hr over 30 Minutes Intravenous Once 06/02/20 1938 06/02/20 1954   06/02/20 1930  remdesivir 200 mg in sodium chloride 0.9% 250 mL IVPB       "Followed by" Linked Group Details   200 mg 580 mL/hr over 30 Minutes Intravenous Once 06/02/20 1827 06/02/20 2119      Inpatient Medications  Scheduled Meds: . (feeding supplement) PROSource Plus  30 mL Oral BID BM  . amLODipine  7.5 mg Oral Daily  . aspirin EC  81 mg Oral Daily  . carvedilol  12.5 mg Oral BID WC  . cephALEXin  500 mg Oral Q24H  . clopidogrel  75 mg Oral Daily  . donepezil  5 mg Oral Daily  . DULoxetine  30 mg Oral Daily  . feeding supplement (ENSURE ENLIVE)  237 mL Oral BID BM  . ferrous sulfate  325 mg Oral Q breakfast  . gabapentin  100 mg Oral TID  . heparin  5,000 Units Subcutaneous Q8H  . Ipratropium-Albuterol  1 puff Inhalation Q6H  . mouth rinse  15 mL Mouth Rinse BID  . methylPREDNISolone (SOLU-MEDROL) injection  40 mg Intravenous Q12H  . multivitamin with minerals  1 tablet Oral Daily  . pantoprazole  40 mg Oral Daily  . rosuvastatin  40 mg Oral Daily  . sodium bicarbonate  1,300 mg Oral BID   Continuous Infusions: . remdesivir 100 mg in NS 100 mL Stopped (06/05/20 1045)   PRN Meds:.acetaminophen, albuterol, busPIRone, chlorpheniramine-HYDROcodone, diclofenac Sodium, guaiFENesin-dextromethorphan,  hydrALAZINE, metoprolol tartrate, nitroGLYCERIN, ondansetron **OR** ondansetron (ZOFRAN) IV   Time Spent in minutes  25  See all Orders from today for further details   Oren Binet M.D on 06/06/2020 at 7:35 AM  To page go to [email or web address] - use universal password  [facility] -  Office  [phone]    Objective:   Vitals:   06/05/20 1646 06/05/20 1700 06/05/20 2000 06/06/20 0442  BP: (!) 148/83  140/76 140/83  Pulse: 86 82 83 77  Resp:   20 18  Temp:   98.6 F (37 C) 98.7 F (37.1 C)  TempSrc:   Oral Oral  SpO2:  97% 96% 98%  Weight:      Height:        Wt Readings from Last 3 Encounters:  06/03/20 42.1 kg  04/14/20 44.5 kg  02/10/20 45 kg     Intake/Output Summary (Last 24 hours) at 06/06/2020 0735 Last data filed at 06/05/2020 1700 Gross per 24 hour  Intake 240 ml  Output 400 ml  Net -160 ml     Physical Exam Gen Exam:Alert awake-not in any distress HEENT:atraumatic, normocephalic Chest: B/L clear to auscultation anteriorly CVS:S1S2 regular Abdomen:soft non tender,  non distended Extremities:no edema Neurology: Non focal Skin: no rash   Data Review:    CBC Recent Labs  Lab 06/02/20 1512 06/02/20 1512 06/02/20 1954 06/03/20 0413 06/03/20 1421 06/04/20 0251 06/05/20 0337  WBC 15.3*  --  14.9* 14.4*  --  12.8* 12.5*  HGB 9.7*   < > 8.9* 8.6* 8.2* 7.6* 7.1*  HCT 29.8*   < > 27.3* 26.4* 24.0* 21.6* 20.2*  PLT 404*  --  382 396  --  348 314  MCV 89.0  --  88.9 88.6  --  80.9 81.8  MCH 29.0  --  29.0 28.9  --  28.5 28.7  MCHC 32.6  --  32.6 32.6  --  35.2 35.1  RDW 16.8*  --  16.8* 17.1*  --  15.2 15.0  LYMPHSABS 1.2  --   --  0.9  --  0.9 1.1  MONOABS 1.0  --   --  0.2  --  0.2 0.3  EOSABS 0.4  --   --  0.0  --  0.0 0.0  BASOSABS 0.1  --   --  0.1  --  0.0 0.0   < > = values in this interval not displayed.    Chemistries  Recent Labs  Lab 06/02/20 1512 06/02/20 1954 06/03/20 0413 06/03/20 0413 06/03/20 1235 06/03/20 1417  06/03/20 1421 06/04/20 0251 06/05/20 0337  NA 143  --  144   < > 142 142 142 141 135  K 4.8  --  4.7   < > 3.9 3.7 3.7 3.6 3.7  CL 117*  --  119*  --  114* 112*  --  112* 99  CO2 7*  --  <7*  --  7* 11*  --  15* 21*  GLUCOSE 93  --  223*  --  225* 195*  --  171* 224*  BUN 78*  --  77*  --  75* 76*  --  81* 89*  CREATININE 7.43*   < > 6.27*  --  5.32* 5.42*  --  4.74* 4.07*  CALCIUM 9.3  --  8.8*  --  8.4* 8.5*  --  8.0* 7.8*  MG  --   --  1.8  --   --   --   --  1.6* 1.7  AST 18  --  25  --   --   --   --  21 28  ALT 12  --  14  --   --   --   --  13 16  ALKPHOS 107  --  98  --   --   --   --  82 73  BILITOT 0.6  --  0.5  --   --   --   --  0.2* 0.5   < > = values in this interval not displayed.   ------------------------------------------------------------------------------------------------------------------ No results for input(s): CHOL, HDL, LDLCALC, TRIG, CHOLHDL, LDLDIRECT in the last 72 hours.  Lab Results  Component Value Date   HGBA1C 6.5 (H) 09/19/2019   ------------------------------------------------------------------------------------------------------------------ No results for input(s): TSH, T4TOTAL, T3FREE, THYROIDAB in the last 72 hours.  Invalid input(s): FREET3 ------------------------------------------------------------------------------------------------------------------ Recent Labs    06/04/20 0251 06/05/20 0337  FERRITIN 693* 2,283*    Coagulation profile No results for input(s): INR, PROTIME in the last 168 hours.  Recent Labs    06/04/20 0251 06/05/20 0337  DDIMER 4.94* 3.77*    Cardiac Enzymes No results for input(s): CKMB, TROPONINI, MYOGLOBIN in the last  168 hours.  Invalid input(s): CK ------------------------------------------------------------------------------------------------------------------    Component Value Date/Time   BNP 90.8 01/02/2020 1430    Micro Results Recent Results (from the past 240 hour(s))  SARS  Coronavirus 2 by RT PCR (hospital order, performed in [facility] hospital lab) Nasopharyngeal Nasopharyngeal Swab     Status: Abnormal   Collection Time: 06/02/20  2:15 PM   Specimen: Nasopharyngeal Swab  Result Value Ref Range Status   SARS Coronavirus 2 POSITIVE (A) NEGATIVE Final    Comment: RESULT CALLED TO, READ BACK BY AND VERIFIED WITH: E,ADKINS @1604  06/02/20 EB (NOTE) SARS-CoV-2 target nucleic acids are DETECTED  SARS-CoV-2 RNA is generally detectable in upper respiratory specimens  during the acute phase of infection.  Positive results are indicative  of the presence of the identified virus, but do not rule out bacterial infection or co-infection with other pathogens not detected by the test.  Clinical correlation with patient history and  other diagnostic information is necessary to determine patient infection status.  The expected result is negative.  Fact Sheet for Patients:   StrictlyIdeas.no   Fact Sheet for Healthcare Providers:   BankingDealers.co.za    This test is not yet approved or cleared by the [place] FDA and  has been authorized for detection and/or diagnosis of SARS-CoV-2 by FDA under an Emergency Use Authorization (EUA).  This EUA will remain in effect (meaning this test can be  used) for the duration of  the COVID-19 declaration under Section 564(b)(1) of the Act, 21 U.S.C. section 360-bbb-3(b)(1), unless the authorization is terminated or revoked sooner.  Performed at [facility], [place] [address] [place], [place] [address]   Culture, Urine     Status: Abnormal   Collection Time: 06/02/20  9:15 PM   Specimen: Urine, Random  Result Value Ref Range Status   Specimen Description URINE, RANDOM  Final   Special Requests   Final    NONE Performed at [facility], [place] [address], [place], [place] [address]    Culture >=100,000 COLONIES/mL ESCHERICHIA COLI (A)  Final   Report Status  06/04/2020 FINAL  Final   Organism ID, Bacteria ESCHERICHIA COLI (A)  Final      Susceptibility   Escherichia coli - MIC*    AMPICILLIN >=32 RESISTANT Resistant     CEFAZOLIN <=4 SENSITIVE Sensitive     CEFTRIAXONE <=0.25 SENSITIVE Sensitive     CIPROFLOXACIN <=0.25 SENSITIVE Sensitive     GENTAMICIN <=1 SENSITIVE Sensitive     IMIPENEM <=0.25 SENSITIVE Sensitive     NITROFURANTOIN <=16 SENSITIVE Sensitive     TRIMETH/SULFA <=20 SENSITIVE Sensitive     AMPICILLIN/SULBACTAM >=32 RESISTANT Resistant     PIP/TAZO <=4 SENSITIVE Sensitive     * >=100,000 COLONIES/mL ESCHERICHIA COLI    Radiology Reports CT ABDOMEN PELVIS WO CONTRAST  Result Date: 06/02/2020 CLINICAL DATA:  Shortness of breath, chest pain and abdominal pain. EXAM: CT ABDOMEN AND PELVIS WITHOUT CONTRAST TECHNIQUE: Multidetector CT imaging of the abdomen and pelvis was performed following the standard protocol without IV contrast. COMPARISON:  May 02, 2020 and November 01, 2019 FINDINGS: Lower chest: A cluster of 2 mm noncalcified lung nodules are seen within the posterolateral aspect of the right lung base. Hepatobiliary: No focal liver abnormality is seen. No gallstones, gallbladder wall thickening, or biliary dilatation. Pancreas: Unremarkable. No pancreatic ductal dilatation or surrounding inflammatory changes. Spleen: Normal in size without focal abnormality. Adrenals/Urinary Tract: Adrenal glands are unremarkable. Kidneys are normal, without  renal calculi, focal lesion, or hydronephrosis. Bladder is unremarkable. Stomach/Bowel: There is a large gastric hernia. Appendix appears normal. No evidence of bowel dilatation. Noninflamed diverticula are seen throughout the sigmoid colon. Vascular/Lymphatic: There is marked severity calcification of the abdominal aorta and bilateral common iliac arteries, without evidence of aneurysmal dilatation. 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 A predominant stable 2.8 cm x 2.4 cm mildly lobulated area of  heterogeneous low attenuation is seen within the region anterior to the right common femoral artery (axial CT image 58, CT series number 3). Reproductive: The uterus is mildly enlarged and heterogeneous in appearance. The bilateral adnexa are limited in evaluation secondary to streak artifact. Other: No abdominal wall hernia or abnormality. No abdominopelvic ascites. Musculoskeletal: A total right hip replacement is seen with extensive amount of associated streak artifact. Subsequently limited evaluation of the adjacent osseous and soft tissue structures is noted. Bilateral metallic density pedicle screws are seen at the levels of L4 and L5. Metallic density operative material is seen along the L4-L5 intervertebral disc space. IMPRESSION: 1. Sigmoid diverticulosis without evidence of acute diverticulitis. 2. Postoperative changes within the lower lumbar spine. 3. Total right hip replacement. 4. Large gastric hernia. 5. Evidence of prior fem-fem bypass graft. 6. Predominant stable area of lobulated low-attenuation anterior to the expected region of the right common femoral artery. While this may be vascular in origin, the presence of a stable soft tissue mass cannot be excluded. Aortic Atherosclerosis (ICD10-I70.0). Electronically Signed   By: Virgina Norfolk M.D.   On: 06/02/2020 17:49   NM Pulmonary Perfusion  Result Date: 06/03/2020 CLINICAL DATA:  COVID-19 positive EXAM: NUCLEAR MEDICINE PERFUSION LUNG SCAN TECHNIQUE: Perfusion images were obtained in multiple projections after intravenous injection of radiopharmaceutical. Ventilation scans intentionally deferred if perfusion scan and chest x-ray adequate for interpretation during COVID 19 epidemic. RADIOPHARMACEUTICALS:  4.2 mCi Tc-55m MAA IV COMPARISON:  Same day chest x-ray.  Perfusion scan 01/02/2020 FINDINGS: Physiologic distribution of radiotracer throughout both lungs. No perfusion defect. IMPRESSION: Normal perfusion scan without evidence for  pulmonary embolism. Electronically Signed   By: Davina Poke D.O.   On: 06/03/2020 16:39   DG Chest Port 1 View  Result Date: 06/03/2020 CLINICAL DATA:  Elevated D-dimer. EXAM: PORTABLE CHEST 1 VIEW COMPARISON:  06/02/2020. FINDINGS: Mediastinum and hilar structures normal. Heart size normal. Mild bibasilar atelectasis/infiltrates. No pleural effusion or pneumothorax. Degenerative changes thoracic spine and both shoulders. No acute bony abnormality. IMPRESSION: Mild bibasilar atelectasis/infiltrates. Electronically Signed   By: Marcello Moores  Register   On: 06/03/2020 14:26   DG Chest Port 1 View  Result Date: 06/02/2020 CLINICAL DATA:  Shortness of breath, COVID positive contact EXAM: PORTABLE CHEST 1 VIEW COMPARISON:  03/14/2020 FINDINGS: No new consolidation or edema. Chronic interstitial prominence. No pleural effusion or pneumothorax. Stable cardiomediastinal contours with normal heart size. IMPRESSION: No acute process in the chest. Electronically Signed   By: Macy Mis M.D.   On: 06/02/2020 14:35   ECHOCARDIOGRAM COMPLETE  Result Date: 05/29/2020    ECHOCARDIOGRAM REPORT   Patient Name:   BAYLIN CABAL Date of Exam: 05/29/2020 Medical Rec #:  [id number]            Height:       63.0 in Accession #:    [id number]           Weight:       98.2 lb Date of Birth:  [birth year]           BSA:  1.429 m Patient Age:    87 years             BP:           142/86 mmHg Patient Gender: F                    HR:           92 bpm. Exam Location:  [address] Procedure: 2D Echo, 3D Echo, Cardiac Doppler, Color Doppler and Strain Analysis Indications:    I21.4 MI  History:        Patient has prior history of Echocardiogram examinations, most                 recent 01/03/2020. Previous Myocardial Infarction, PVD,                 Signs/Symptoms:Syncope; Risk Factors:Current Smoker and                 Dyslipidemia. CKD stage 4. Anemia.  Sonographer:    Jessee Avers, RDCS Referring Phys: [place]  1. Left ventricular ejection fraction, by estimation, is 55 to 60%. The left ventricle has normal function. The left ventricle has no regional wall motion abnormalities. There is severe concentric left ventricular hypertrophy. Left ventricular diastolic  parameters are indeterminate. Elevated left ventricular end-diastolic pressure.  2. Right ventricular systolic function is normal. The right ventricular size is normal. Tricuspid regurgitation signal is inadequate for assessing PA pressure.  3. The mitral valve is grossly normal. Trivial mitral valve regurgitation. No evidence of mitral stenosis.  4. The aortic valve is tricuspid. Aortic valve regurgitation is not visualized. No aortic stenosis is present.  5. Aortic dilatation noted. There is borderline dilatation of the ascending aorta measuring 38 mm. Comparison(s): 01/03/20 EF 60-65%. FINDINGS  Left Ventricle: Basal lateral wall incompletely visualized, cannot exclude focal wall motion abnormality. Left ventricular ejection fraction, by estimation, is 55 to 60%. The left ventricle has normal function. The left ventricle has no regional wall motion abnormalities. Global longitudinal strain performed but not reported based on interpreter judgement due to suboptimal tracking. The left ventricular internal cavity size was normal in size. There is severe concentric left ventricular hypertrophy. Left ventricular diastolic parameters are indeterminate. Elevated left ventricular end-diastolic pressure. Right Ventricle: The right ventricular size is normal. No increase in right ventricular wall thickness. Right ventricular systolic function is normal. Tricuspid regurgitation signal is inadequate for assessing PA pressure. Left Atrium: Left atrial size was normal in size. Right Atrium: Right atrial size was normal in size. Pericardium: There is no evidence of pericardial effusion. Mitral Valve: The mitral valve is grossly normal. There is mild thickening  of the mitral valve leaflet(s). There is mild calcification of the mitral valve leaflet(s). Trivial mitral valve regurgitation. No evidence of mitral valve stenosis. Tricuspid Valve: The tricuspid valve is normal in structure. Tricuspid valve regurgitation is trivial. No evidence of tricuspid stenosis. Aortic Valve: The aortic valve is tricuspid. Aortic valve regurgitation is not visualized. No aortic stenosis is present. Pulmonic Valve: The pulmonic valve was not well visualized. Pulmonic valve regurgitation is trivial. No evidence of pulmonic stenosis. Aorta: Aortic dilatation noted. There is borderline dilatation of the ascending aorta measuring 38 mm. Venous: The inferior vena cava was not well visualized. IAS/Shunts: The atrial septum is grossly normal.  LEFT VENTRICLE PLAX 2D LVIDd:         3.30 cm  Diastology LVIDs:  2.30 cm  LV e' lateral:   5.78 cm/s LV PW:         1.50 cm  LV E/e' lateral: 10.8 LV IVS:        1.90 cm  LV e' medial:    2.40 cm/s LVOT diam:     2.00 cm  LV E/e' medial:  26.0 LV SV:         37 LV SV Index:   26       2D Longitudinal Strain LVOT Area:     3.14 cm 2D Strain GLS (A2C):   -15.7 %                         2D Strain GLS (A3C):   -10.0 %                         2D Strain GLS (A4C):   -10.1 %                         2D Strain GLS Avg:     -12.0 %                          3D Volume EF:                         3D EF:        58 %                         LV EDV:       76 ml                         LV ESV:       32 ml                         LV SV:        44 ml RIGHT VENTRICLE RV Basal diam:  2.60 cm RV S prime:     12.90 cm/s TAPSE (M-mode): 2.0 cm LEFT ATRIUM             Index       RIGHT ATRIUM           Index LA diam:        3.50 cm 2.45 cm/m  RA Pressure: 3.00 mmHg LA Vol (A2C):   15.1 ml 10.57 ml/m RA Area:     6.29 cm LA Vol (A4C):   14.5 ml 10.15 ml/m RA Volume:   9.35 ml   6.54 ml/m LA Biplane Vol: 14.7 ml 10.29 ml/m  AORTIC VALVE LVOT Vmax:   77.50 cm/s LVOT Vmean:   49.100 cm/s LVOT VTI:    0.117 m  AORTA Ao Root diam: 2.90 cm Ao Asc diam:  3.80 cm MITRAL VALVE                TRICUSPID VALVE                             Estimated RAP:  3.00 mmHg  MV E velocity: 62.40 cm/s   SHUNTS MV A velocity: 117.00 cm/s  Systemic VTI:  0.12 m MV E/A ratio:  0.53  Systemic Diam: 2.00 cm Buford Dresser MD Electronically signed by Buford Dresser MD Signature Date/Time: 05/29/2020/4:38:39 PM    Final    VAS [place] LOWER EXTREMITY VENOUS (DVT)  Result Date: 06/04/2020  Lower Venous DVTStudy Other Indications: Covid positive with elevated d-dimer. Comparison Study: 12/12/18 LE venous - negative Performing Technologist: Velva Harman Sturdivant RDMS, RVT  Examination Guidelines: A complete evaluation includes B-mode imaging, spectral Doppler, color Doppler, and power Doppler as needed of all accessible portions of each vessel. Bilateral testing is considered an integral part of a complete examination. Limited examinations for reoccurring indications may be performed as noted. The reflux portion of the exam is performed with the patient in reverse Trendelenburg.  +---------+---------------+---------+-----------+----------+--------------+ RIGHT    CompressibilityPhasicitySpontaneityPropertiesThrombus Aging +---------+---------------+---------+-----------+----------+--------------+ CFV      Full           Yes      Yes                                 +---------+---------------+---------+-----------+----------+--------------+ SFJ      Full                                                        +---------+---------------+---------+-----------+----------+--------------+ FV Prox  Full                                                        +---------+---------------+---------+-----------+----------+--------------+ FV Mid   Full                                                        +---------+---------------+---------+-----------+----------+--------------+ FV  DistalFull                                                        +---------+---------------+---------+-----------+----------+--------------+ PFV      Full                                                        +---------+---------------+---------+-----------+----------+--------------+ POP      Full           Yes      Yes                                 +---------+---------------+---------+-----------+----------+--------------+ PTV      Full                                                        +---------+---------------+---------+-----------+----------+--------------+  PERO     Full                                                        +---------+---------------+---------+-----------+----------+--------------+   +---------+---------------+---------+-----------+----------+--------------+ LEFT     CompressibilityPhasicitySpontaneityPropertiesThrombus Aging +---------+---------------+---------+-----------+----------+--------------+ CFV      Full           Yes      Yes                                 +---------+---------------+---------+-----------+----------+--------------+ SFJ      Full                                                        +---------+---------------+---------+-----------+----------+--------------+ FV Prox  Full                                                        +---------+---------------+---------+-----------+----------+--------------+ FV Mid   Full                                                        +---------+---------------+---------+-----------+----------+--------------+ FV DistalFull                                                        +---------+---------------+---------+-----------+----------+--------------+ PFV      Full                                                        +---------+---------------+---------+-----------+----------+--------------+ POP      Full           Yes      Yes                                  +---------+---------------+---------+-----------+----------+--------------+ PTV      Full                                                        +---------+---------------+---------+-----------+----------+--------------+ PERO     Full                                                        +---------+---------------+---------+-----------+----------+--------------+  Summary: BILATERAL: - No evidence of deep vein thrombosis seen in the lower extremities, bilaterally. -No evidence of popliteal cyst, bilaterally. 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   *See table(s) above for measurements and observations. Electronically signed by Curt Jews MD on 06/04/2020 at 5:05:07 PM.    Final

## 2020-06-07 DIAGNOSIS — E440 Moderate protein-calorie malnutrition: Secondary | ICD-10-CM

## 2020-06-07 LAB — CBC WITH DIFFERENTIAL/PLATELET
Abs Immature Granulocytes: 0.18 10*3/uL — ABNORMAL HIGH (ref 0.00–0.07)
Basophils Absolute: 0 10*3/uL (ref 0.0–0.1)
Basophils Relative: 0 %
Eosinophils Absolute: 0 10*3/uL (ref 0.0–0.5)
Eosinophils Relative: 0 %
HCT: 26.6 % — ABNORMAL LOW (ref 36.0–46.0)
Hemoglobin: 9.2 g/dL — ABNORMAL LOW (ref 12.0–15.0)
Immature Granulocytes: 2 %
Lymphocytes Relative: 14 %
Lymphs Abs: 1.8 10*3/uL (ref 0.7–4.0)
MCH: 28.1 pg (ref 26.0–34.0)
MCHC: 34.6 g/dL (ref 30.0–36.0)
MCV: 81.3 fL (ref 80.0–100.0)
Monocytes Absolute: 1.2 10*3/uL — ABNORMAL HIGH (ref 0.1–1.0)
Monocytes Relative: 10 %
Neutro Abs: 9.2 10*3/uL — ABNORMAL HIGH (ref 1.7–7.7)
Neutrophils Relative %: 74 %
Platelets: 318 10*3/uL (ref 150–400)
RBC: 3.27 MIL/uL — ABNORMAL LOW (ref 3.87–5.11)
RDW: 14.1 % (ref 11.5–15.5)
WBC: 12.4 10*3/uL — ABNORMAL HIGH (ref 4.0–10.5)
nRBC: 1.1 % — ABNORMAL HIGH (ref 0.0–0.2)

## 2020-06-07 LAB — TYPE AND SCREEN
ABO/RH(D): O POS
Antibody Screen: NEGATIVE
Unit division: 0

## 2020-06-07 LAB — GLUCOSE, CAPILLARY
Glucose-Capillary: 131 mg/dL — ABNORMAL HIGH (ref 70–99)
Glucose-Capillary: 273 mg/dL — ABNORMAL HIGH (ref 70–99)

## 2020-06-07 LAB — COMPREHENSIVE METABOLIC PANEL
ALT: 20 U/L (ref 0–44)
AST: 29 U/L (ref 15–41)
Albumin: 2.4 g/dL — ABNORMAL LOW (ref 3.5–5.0)
Alkaline Phosphatase: 69 U/L (ref 38–126)
Anion gap: 14 (ref 5–15)
BUN: 80 mg/dL — ABNORMAL HIGH (ref 8–23)
CO2: 23 mmol/L (ref 22–32)
Calcium: 8.5 mg/dL — ABNORMAL LOW (ref 8.9–10.3)
Chloride: 98 mmol/L (ref 98–111)
Creatinine, Ser: 3.04 mg/dL — ABNORMAL HIGH (ref 0.44–1.00)
GFR calc Af Amer: 17 mL/min — ABNORMAL LOW (ref >60)
GFR calc non Af Amer: 14 mL/min — ABNORMAL LOW (ref >60)
Glucose, Bld: 136 mg/dL — ABNORMAL HIGH (ref 70–99)
Potassium: 3.7 mmol/L (ref 3.5–5.1)
Sodium: 135 mmol/L (ref 135–145)
Total Bilirubin: 0.6 mg/dL (ref 0.3–1.2)
Total Protein: 5.3 g/dL — ABNORMAL LOW (ref 6.5–8.1)

## 2020-06-07 LAB — BPAM RBC
Blood Product Expiration Date: 202109212359
ISSUE DATE / TIME: 202108201256
Unit Type and Rh: 5100

## 2020-06-07 LAB — HEMOGLOBIN A1C
Hgb A1c MFr Bld: 6.2 % — ABNORMAL HIGH (ref 4.8–5.6)
Mean Plasma Glucose: 131.24 mg/dL

## 2020-06-07 MED ORDER — PREDNISONE 10 MG PO TABS: ORAL_TABLET | ORAL | 0 refills | Status: DC

## 2020-06-07 MED ORDER — PROSOURCE PLUS PO LIQD: 30.0000 mL | Freq: Two times a day (BID) | ORAL | 0 refills | Status: DC

## 2020-06-07 NOTE — Discharge Instructions (Signed)
Person Under Monitoring Name: Alyssa [name]  Location: [address] [place] [address]   Infection Prevention Recommendations for Individuals Confirmed to have, or Being Evaluated for, 2019 Novel Coronavirus (COVID-19) Infection Who Receive Care at Home  Individuals who are confirmed to have, or are being evaluated for, COVID-19 should follow the prevention steps below until a healthcare provider or local or state health department says they can return to normal activities.  Stay home except to get medical care You should restrict activities outside your home, except for getting medical care. Do not go to work, school, or public areas, and do not use public transportation or taxis.  Call ahead before visiting your doctor Before your medical appointment, call the healthcare provider and tell them that you have, or are being evaluated for, COVID-19 infection. This will help the healthcare provider's office take steps to keep other people from getting infected. Ask your healthcare provider to call the local or state health department.  Monitor your symptoms Seek prompt medical attention if your illness is worsening (e.g., difficulty breathing). Before going to your medical appointment, call the healthcare provider and tell them that you have, or are being evaluated for, COVID-19 infection. Ask your healthcare provider to call the local or state health department.  Wear a facemask You should wear a facemask that covers your nose and mouth when you are in the same room with other people and when you visit a healthcare provider. People who live with or visit you should also wear a facemask while they are in the same room with you.  Separate yourself from other people in your home As much as possible, you should stay in a different room from other people in your home. Also, you should use a separate bathroom, if available.  Avoid sharing household items You should not  share dishes, drinking glasses, cups, eating utensils, towels, bedding, or other items with other people in your home. After using these items, you should wash them thoroughly with soap and water.  Cover your coughs and sneezes Cover your mouth and nose with a tissue when you cough or sneeze, or you can cough or sneeze into your sleeve. Throw used tissues in a lined trash can, and immediately wash your hands with soap and water for at least 20 seconds or use an alcohol-based hand rub.  Wash your [facility] your hands often and thoroughly with soap and water for at least 20 seconds. You can use an alcohol-based hand sanitizer if soap and water are not available and if your hands are not visibly dirty. Avoid touching your eyes, nose, and mouth with unwashed hands.   Prevention Steps for Caregivers and Household Members of Individuals Confirmed to have, or Being Evaluated for, COVID-19 Infection Being Cared for in the Home  If you live with, or provide care at home for, a person confirmed to have, or being evaluated for, COVID-19 infection please follow these guidelines to prevent infection:  Follow healthcare provider's instructions Make sure that you understand and can help the patient follow any healthcare provider instructions for all care.  Provide for the patient's basic needs You should help the patient with basic needs in the home and provide support for getting groceries, prescriptions, and other personal needs.  Monitor the patient's symptoms If they are getting sicker, call his or her medical provider and tell them that the patient has, or is being evaluated for, COVID-19 infection. This will help the healthcare provider's office  take steps to keep other people from getting infected. Ask the healthcare provider to call the local or state health department.  Limit the number of people who have contact with the patient  If possible, have only one caregiver for the  patient.  Other household members should stay in another home or place of residence. If this is not possible, they should stay  in another room, or be separated from the patient as much as possible. Use a separate bathroom, if available.  Restrict visitors who do not have an essential need to be in the home.  Keep older adults, very young children, and other sick people away from the patient Keep older adults, very young children, and those who have compromised immune systems or chronic health conditions away from the patient. This includes people with chronic heart, lung, or kidney conditions, diabetes, and cancer.  Ensure good ventilation Make sure that shared spaces in the home have good air flow, such as from an air conditioner or an opened window, weather permitting.  Wash your hands often  Wash your hands often and thoroughly with soap and water for at least 20 seconds. You can use an alcohol based hand sanitizer if soap and water are not available and if your hands are not visibly dirty.  Avoid touching your eyes, nose, and mouth with unwashed hands.  Use disposable paper towels to dry your hands. If not available, use dedicated cloth towels and replace them when they become wet.  Wear a facemask and gloves  Wear a disposable facemask at all times in the room and gloves when you touch or have contact with the patient's blood, body fluids, and/or secretions or excretions, such as sweat, saliva, sputum, nasal mucus, vomit, urine, or feces.  Ensure the mask fits over your nose and mouth tightly, and do not touch it during use.  Throw out disposable facemasks and gloves after using them. Do not reuse.  Wash your hands immediately after removing your facemask and gloves.  If your personal clothing becomes contaminated, carefully remove clothing and launder. Wash your hands after handling contaminated clothing.  Place all used disposable facemasks, gloves, and other waste in a lined  container before disposing them with other household waste.  Remove gloves and wash your hands immediately after handling these items.  Do not share dishes, glasses, or other household items with the patient  Avoid sharing household items. You should not share dishes, drinking glasses, cups, eating utensils, towels, bedding, or other items with a patient who is confirmed to have, or being evaluated for, COVID-19 infection.  After the person uses these items, you should wash them thoroughly with soap and water.  Wash laundry thoroughly  Immediately remove and wash clothes or bedding that have blood, body fluids, and/or secretions or excretions, such as sweat, saliva, sputum, nasal mucus, vomit, urine, or feces, on them.  Wear gloves when handling laundry from the patient.  Read and follow directions on labels of laundry or clothing items and detergent. In general, wash and dry with the warmest temperatures recommended on the label.  Clean all areas the individual has used often  Clean all touchable surfaces, such as counters, tabletops, doorknobs, bathroom fixtures, toilets, phones, keyboards, tablets, and bedside tables, every day. Also, clean any surfaces that may have blood, body fluids, and/or secretions or excretions on them.  Wear gloves when cleaning surfaces the patient has come in contact with.  Use a diluted bleach solution (e.g., dilute bleach with 1 part  bleach and 10 parts water) or a household disinfectant with a label that says EPA-registered for coronaviruses. To make a bleach solution at home, add 1 tablespoon of bleach to 1 quart (4 cups) of water. For a larger supply, add  cup of bleach to 1 gallon (16 cups) of water.  Read labels of cleaning products and follow recommendations provided on product labels. Labels contain instructions for safe and effective use of the cleaning product including precautions you should take when applying the product, such as wearing gloves or  eye protection and making sure you have good ventilation during use of the product.  Remove gloves and wash hands immediately after cleaning.  Monitor yourself for signs and symptoms of illness Caregivers and household members are considered close contacts, should monitor their health, and will be asked to limit movement outside of the home to the extent possible. Follow the monitoring steps for close contacts listed on the symptom monitoring form.   ? If you have additional questions, contact your local health department or call the epidemiologist on call at [phone] (available 24/7). ? This guidance is subject to change. For the most up-to-date guidance from [facility], please refer to their website: [email or web address]

## 2020-06-07 NOTE — Progress Notes (Signed)
Occupational Therapy Treatment Patient Details Name: Alyssa [name] MRN: [id number] DOB: 11-02-1940 Today's Date: 06/07/2020    History of present illness Pt adm with acute hypoxic respiratory failure due to copd exacerbation and covid PNA. PMH - copd, chronic pain, ckd, HTN, rt THR, dementia   OT comments  Pt progressing towards established OT goals. Pt performing toileting at [place] for safety; large BM and notified RN. Providing education and handout on EC; pt verbalized understanding. Continue to recommend dc to home once medically stable per phsyician. Answering questions in preparation for dc later today.   Follow Up Recommendations  No OT follow up;Supervision/Assistance - 24 hour (HHPT)    Equipment Recommendations  None recommended by OT    Recommendations for Other Services PT consult    Precautions / Restrictions Precautions Precautions: Fall       Mobility Bed Mobility Overal bed mobility: Modified Independent Bed Mobility: Supine to Sit;Sit to Supine     Supine to sit: Modified independent (Device/Increase time) Sit to supine: Modified independent (Device/Increase time)   General bed mobility comments: Increased time due to increased effort  Transfers Overall transfer level: Needs assistance Equipment used: Rolling walker (2 wheeled) Transfers: Sit to/from Stand Sit to Stand: Supervision;Min guard         General transfer comment: supervision-Min Guard A for safety and lines    Balance Overall balance assessment: Needs assistance Sitting-balance support: No upper extremity supported;Feet supported Sitting balance-Leahy Scale: Fair     Standing balance support: Bilateral upper extremity supported;During functional activity Standing balance-Leahy Scale: Poor Standing balance comment: rollator and supervision for static stand                           ADL either performed or assessed with clinical judgement   ADL  Overall ADL's : Needs assistance/impaired                         Toilet Transfer: Min guard;Ambulation;BSC;RW Toilet Transfer Details (indicate cue type and reason): Pt with BM Toileting- Clothing Manipulation and Hygiene: Supervision/safety;Sitting/lateral lean       Functional mobility during ADLs: Min guard;Rolling walker General ADL Comments: Continues to present with decreased acitivty tolerance. Provided pt with handout and education on energy conservation techniques for ADLs.      Vision       Perception     Praxis      Cognition Arousal/Alertness: Awake/alert Behavior During Therapy: WFL for tasks assessed/performed Overall Cognitive Status: Within Functional Limits for tasks assessed                                 General Comments: Required verbal cues         Exercises     Shoulder Instructions       General Comments Spo2 90s on RA    Pertinent Vitals/ Pain       Pain Assessment: Faces Faces Pain Scale: Hurts little more Pain Location: head Pain Descriptors / Indicators: Aching Pain Intervention(s): Monitored during session;Limited activity within patient's tolerance;Repositioned  Home Living                                          Prior Functioning/Environment  Frequency  Min 2X/week        Progress Toward Goals  OT Goals(current goals can now be found in the care plan section)  Progress towards OT goals: Progressing toward goals  Acute Rehab OT Goals Patient Stated Goal: go home OT Goal Formulation: With patient Time For Goal Achievement: 06/19/20 Potential to Achieve Goals: Good ADL Goals Pt Will Perform Grooming: with modified independence;standing;sitting Pt Will Perform Upper Body Dressing: with modified independence;sitting Pt Will Perform Lower Body Dressing: with modified independence;sit to/from stand Pt Will Transfer to Toilet: with modified  independence;ambulating;bedside commode Pt Will Perform Toileting - Clothing Manipulation and hygiene: with modified independence;sitting/lateral leans Additional ADL Goal #1: Pt will independently monitor SpO2 and use purse lip breathing during ADLs Additional ADL Goal #2: Pt will verbalize three energy conservation techniques for ADLs with Min cues  Plan Discharge plan remains appropriate    Co-evaluation                 AM-PAC OT "6 Clicks" Daily Activity     Outcome Measure   Help from another person eating meals?: None Help from another person taking care of personal grooming?: A Little Help from another person toileting, which includes using toliet, bedpan, or urinal?: A Little Help from another person bathing (including washing, rinsing, drying)?: A Little Help from another person to put on and taking off regular upper body clothing?: A Little Help from another person to put on and taking off regular lower body clothing?: A Little 6 Click Score: 19    End of Session Equipment Utilized During Treatment: Rolling walker  OT Visit Diagnosis: Unsteadiness on feet (R26.81);Other abnormalities of gait and mobility (R26.89);Muscle weakness (generalized) (M62.81)   Activity Tolerance Patient tolerated treatment well   Patient Left in bed;with call bell/phone within reach;with bed alarm set   Nurse Communication Mobility status        Time: 1415-1440 OT Time Calculation (min): 25 min  Charges: OT General Charges $OT Visit: 1 Visit OT Treatments $Self Care/Home Management : 23-37 mins  [place], OTR/L Acute Rehab Pager: [phone] Office: [place] 06/07/2020, 4:57 PM

## 2020-06-07 NOTE — TOC Transition Note (Signed)
Transition of Care [facility]) - CM/SW Discharge Note   Patient Details  Name: Alyssa [name] MRN: [id number] Date of Birth: 1941-08-14  Transition of Care [facility] CM/SW Contact:  Carles Collet, RN Phone Number: 06/07/2020, 9:58 AM   Clinical Narrative:    [facility] that patient will DC today    Final next level of care: [place] Barriers to Discharge: No Barriers Identified   Patient Goals and CMS Choice        Discharge Placement                       Discharge Plan and Services   Discharge Planning Services: CM Consult                      HH Arranged: PT, OT HH Agency: [place] Date [place]: 06/07/20 Time [place]: [phone] Representative spoke with at [place]: [place] ([place]) Interventions     Readmission Risk Interventions Readmission Risk Prevention Plan 12/26/2018  Transportation Screening Complete  Medication Review [employer]) Complete  PCP or Specialist appointment within 3-5 days of discharge Not Complete  PCP/Specialist Appt Not Complete comments pt for snf. Snf MD to see as needed.  [place] or Home Care Consult Not Complete  HRI or Home Care Consult Pt Refusal Comments NA  SW Recovery Care/Counseling Consult Not Complete  SW Consult Not Complete Comments NA  Palliative Care Screening Complete  Skilled Nursing Facility Complete  Some recent data might be hidden

## 2020-06-07 NOTE — Discharge Summary (Signed)
PATIENT DETAILS Name: Alyssa [name] Age: 79 y.o. Sex: female Date of Birth: 1941-05-13 MRN: [id number]. Admitting Physician: Elwyn Reach, MD JSH:FWYOV, Lucila Maine, DO  Admit Date: 06/02/2020 Discharge date: 06/07/2020  Recommendations for Outpatient Follow-up:  1. Follow up with PCP in 1-2 weeks 2. Please obtain CMP/CBC in one week 3. Please ensure follow-up with nephrology, vascular surgery 4. Repeat chest x-ray in 4 to 6 weeks to ensure resolution of infiltrates  Admitted From:  Home  Disposition: Home with home health services   Home Health: Yes  Equipment/Devices: None  Discharge Condition: Stable  CODE STATUS: FULL CODE  Diet recommendation:  Diet Order            Diet - low sodium heart healthy           Diet Heart Room service appropriate? Yes; Fluid consistency: Thin  Diet effective now                 Brief Narrative: Patient is a 79 y.o. female with PMHx of COPD, chronic pain syndrome, CKD stage IV, HTN, HLD-who presented to the hospital with chills, cough, poor appetite-she was found to have COVID-19 pneumonia and AKI.  She was subsequently admitted to the hospitalist service.  See below for further details.  COVID-19 vaccinated status: vaccinated  Significant Events: 8/16>> Admit to [facility] for AKI on CKD stage IV, COVID-19 infection.  Significant studies: 8/12>> Echo: EF 55-60% 8/16>>Chest x-ray: No acute process in the chest. 8/16>> CT abdomen/pelvis: Sigmoid diverticulosis, large gastric hernia-lobulated low-attenuation anterior to the expected region of the right common femoral artery-may be vascular in origin 8/17>> chest x-ray: Mild bibasilar infiltrates. 8/17>> normal perfusion scan without evidence of PE. 8/18>> bilateral lower extremity Doppler: No DVT-partial thrombosed pseudoaneurysm in the right groin.  COVID-19 medications: Steroids: 8/16>> Remdesivir: 8/16>>8/20  Antibiotics: Rocephin: 8/16>> 8/17 Zithromax:  8/16>> 8/17 Keflex: 8/18>>8/20  Microbiology data: 8/16>> urine culture: E. coli (resistant to ampicillin/Unasyn)  Brief Hospital Course: Acute hypoxic Resp Failure due to COPD exacerbation and Covid 19 Viral pneumonia: Respiratory failure has resolved-on room air this morning.  Itrated to room air this morning-continue bronchodilators/steroids/remdesivir.    COVID-19 Labs:  Recent Labs    06/05/20 0337 06/06/20 0628  DDIMER 3.77* 3.70*  FERRITIN 2,283* 2,215*  CRP 4.6* 2.8*    Lab Results  Component Value Date   SARSCOV2NAA POSITIVE (A) 06/02/2020   [place] NEGATIVE 01/06/2020   [place] NEGATIVE 01/02/2020   [place] NEGATIVE 11/01/2019     AKI on CKD stage IV: AKI hemodynamically mediated-slowly improving-continue supportive care-nephrology consulted and followed closely.  Creatinine now downtrending with just supportive care-no longer on IV fluids-stable for further monitoring to be done by patient's primary care practitioner and primary nephrologist.  E. coli UTI: Previously on Rocephin-transitioned to Keflex-has completed a course of antimicrobial therapy-does not require any more antibiotics on discharge.  Anemia: Secondary to acute illness superimposed on anemia of CKD-worsened by hemodilution.  Denies any melena/hematochezia.    Did require 1 unit of PRBC during this hospital stay-posttransfusion CBC stable.  Please follow CBC in the outpatient setting.    HTN: BP stable-continue amlodipine and Coreg  Chronic pain syndrome: Stable-continue Neurontin/Cymbalta.  History of PAD (history of femoropopliteal bypass)/carotid stenosis: Continue ASA/Plavix.  Dementia: Appears to be mild-on Aricept.  Partial thrombosed pseudoaneurysm in the right groin: Discussed with Dr. Donnetta Hutching over the phone-recommendations are for observation/follow-up in the outpatient setting-no intervention needed while inpatient.  Debility/deconditioning: Appears frail-debilitated-not  sure what her baseline  is-suspect could further worsening from acute illness-have ordered PT/OT.  Palliative care discussion: Full code.  Per son whom I discussed over the phone on 8/18-no longer being followed by hospice-full code-continue full scope of treatment.  Patient is not on home O2 per son.  Moderate Malnutrition:continue supplements  Discharge Diagnoses:  Principal Problem:   Acute respiratory failure due to COVID-19 [facility]) Active Problems:   Hyperlipidemia   Failure to thrive in adult   Chronic pain syndrome   Diverticulitis   Dementia (HCC)   Acute respiratory failure with hypoxia (HCC)   UTI (urinary tract infection)   Malnutrition of moderate degree (HCC)   Acute renal failure superimposed on chronic kidney disease [facility])   Discharge Instructions:    Person Under Monitoring Name: [name]  Location: [address] [place] [address]   Infection Prevention Recommendations for Individuals Confirmed to have, or Being Evaluated for, 2019 Novel Coronavirus (COVID-19) Infection Who Receive Care at Home  Individuals who are confirmed to have, or are being evaluated for, COVID-19 should follow the prevention steps below until a healthcare provider or local or state health department says they can return to normal activities.  Stay home except to get medical care You should restrict activities outside your home, except for getting medical care. Do not go to work, school, or public areas, and do not use public transportation or taxis.  Call ahead before visiting your doctor Before your medical appointment, call the healthcare provider and tell them that you have, or are being evaluated for, COVID-19 infection. This will help the healthcare provider's office take steps to keep other people from getting infected. Ask your healthcare provider to call the local or state health department.  Monitor your symptoms Seek prompt medical attention if your  illness is worsening (e.g., difficulty breathing). Before going to your medical appointment, call the healthcare provider and tell them that you have, or are being evaluated for, COVID-19 infection. Ask your healthcare provider to call the local or state health department.  Wear a facemask You should wear a facemask that covers your nose and mouth when you are in the same room with other people and when you visit a healthcare provider. People who live with or visit you should also wear a facemask while they are in the same room with you.  Separate yourself from other people in your home As much as possible, you should stay in a different room from other people in your home. Also, you should use a separate bathroom, if available.  Avoid sharing household items You should not share dishes, drinking glasses, cups, eating utensils, towels, bedding, or other items with other people in your home. After using these items, you should wash them thoroughly with soap and water.  Cover your coughs and sneezes Cover your mouth and nose with a tissue when you cough or sneeze, or you can cough or sneeze into your sleeve. Throw used tissues in a lined trash can, and immediately wash your hands with soap and water for at least 20 seconds or use an alcohol-based hand rub.  Wash your [facility] your hands often and thoroughly with soap and water for at least 20 seconds. You can use an alcohol-based hand sanitizer if soap and water are not available and if your hands are not visibly dirty. Avoid touching your eyes, nose, and mouth with unwashed hands.   Prevention Steps for Caregivers and Household Members of Individuals Confirmed to have, or Being Evaluated for, COVID-19  Infection Being Cared for in the Home  If you live with, or provide care at home for, a person confirmed to have, or being evaluated for, COVID-19 infection please follow these guidelines to prevent infection:  Follow healthcare  provider's instructions Make sure that you understand and can help the patient follow any healthcare provider instructions for all care.  Provide for the patient's basic needs You should help the patient with basic needs in the home and provide support for getting groceries, prescriptions, and other personal needs.  Monitor the patient's symptoms If they are getting sicker, call his or her medical provider and tell them that the patient has, or is being evaluated for, COVID-19 infection. This will help the healthcare provider's office take steps to keep other people from getting infected. Ask the healthcare provider to call the local or state health department.  Limit the number of people who have contact with the patient  If possible, have only one caregiver for the patient.  Other household members should stay in another home or place of residence. If this is not possible, they should stay  in another room, or be separated from the patient as much as possible. Use a separate bathroom, if available.  Restrict visitors who do not have an essential need to be in the home.  Keep older adults, very young children, and other sick people away from the patient Keep older adults, very young children, and those who have compromised immune systems or chronic health conditions away from the patient. This includes people with chronic heart, lung, or kidney conditions, diabetes, and cancer.  Ensure good ventilation Make sure that shared spaces in the home have good air flow, such as from an air conditioner or an opened window, weather permitting.  Wash your hands often  Wash your hands often and thoroughly with soap and water for at least 20 seconds. You can use an alcohol based hand sanitizer if soap and water are not available and if your hands are not visibly dirty.  Avoid touching your eyes, nose, and mouth with unwashed hands.  Use disposable paper towels to dry your hands. If not  available, use dedicated cloth towels and replace them when they become wet.  Wear a facemask and gloves  Wear a disposable facemask at all times in the room and gloves when you touch or have contact with the patient's blood, body fluids, and/or secretions or excretions, such as sweat, saliva, sputum, nasal mucus, vomit, urine, or feces.  Ensure the mask fits over your nose and mouth tightly, and do not touch it during use.  Throw out disposable facemasks and gloves after using them. Do not reuse.  Wash your hands immediately after removing your facemask and gloves.  If your personal clothing becomes contaminated, carefully remove clothing and launder. Wash your hands after handling contaminated clothing.  Place all used disposable facemasks, gloves, and other waste in a lined container before disposing them with other household waste.  Remove gloves and wash your hands immediately after handling these items.  Do not share dishes, glasses, or other household items with the patient  Avoid sharing household items. You should not share dishes, drinking glasses, cups, eating utensils, towels, bedding, or other items with a patient who is confirmed to have, or being evaluated for, COVID-19 infection.  After the person uses these items, you should wash them thoroughly with soap and water.  Wash laundry thoroughly  Immediately remove and wash clothes or bedding that have blood, body  fluids, and/or secretions or excretions, such as sweat, saliva, sputum, nasal mucus, vomit, urine, or feces, on them.  Wear gloves when handling laundry from the patient.  Read and follow directions on labels of laundry or clothing items and detergent. In general, wash and dry with the warmest temperatures recommended on the label.  Clean all areas the individual has used often  Clean all touchable surfaces, such as counters, tabletops, doorknobs, bathroom fixtures, toilets, phones, keyboards, tablets, and  bedside tables, every day. Also, clean any surfaces that may have blood, body fluids, and/or secretions or excretions on them.  Wear gloves when cleaning surfaces the patient has come in contact with.  Use a diluted bleach solution (e.g., dilute bleach with 1 part bleach and 10 parts water) or a household disinfectant with a label that says EPA-registered for coronaviruses. To make a bleach solution at home, add 1 tablespoon of bleach to 1 quart (4 cups) of water. For a larger supply, add  cup of bleach to 1 gallon (16 cups) of water.  Read labels of cleaning products and follow recommendations provided on product labels. Labels contain instructions for safe and effective use of the cleaning product including precautions you should take when applying the product, such as wearing gloves or eye protection and making sure you have good ventilation during use of the product.  Remove gloves and wash hands immediately after cleaning.  Monitor yourself for signs and symptoms of illness Caregivers and household members are considered close contacts, should monitor their health, and will be asked to limit movement outside of the home to the extent possible. Follow the monitoring steps for close contacts listed on the symptom monitoring form.   ? If you have additional questions, contact your local health department or call the epidemiologist on call at [phone] (available 24/7). ? This guidance is subject to change. For the most up-to-date guidance from CDC, please refer to their website: YouBlogs.pl    Activity:  As tolerated with Full fall precautions use walker/cane & assistance as needed  Discharge Instructions    Call MD for:  difficulty breathing, headache or visual disturbances   Complete by: As directed    Call MD for:  persistant dizziness or light-headedness   Complete by: As directed    Call MD for:  persistant nausea  and vomiting   Complete by: As directed    Diet - low sodium heart healthy   Complete by: As directed    Discharge instructions   Complete by: As directed    Follow with Primary MD  [name], DO in 1-2 weeks  Please follow with your primary nephrologist in 1-2 weeks  Please follow with your primary vascular surgeon in the next 1-2 weeks  3 weeks of isolation from 06/02/2020  Please get a complete blood count and chemistry panel checked by your Primary MD at your next visit, and again as instructed by your Primary MD.  Get Medicines reviewed and adjusted: Please take all your medications with you for your next visit with your Primary MD  Laboratory/radiological data: Please request your Primary MD to go over all hospital tests and procedure/radiological results at the follow up, please ask your Primary MD to get all Hospital records sent to his/her office.  In some cases, they will be blood work, cultures and biopsy results pending at the time of your discharge. Please request that your primary care M.D. follows up on these results.  Also Note the following: If you experience worsening  of your admission symptoms, develop shortness of breath, life threatening emergency, suicidal or homicidal thoughts you must seek medical attention immediately by calling 911 or calling your MD immediately  if symptoms less severe.  You must read complete instructions/literature along with all the possible adverse reactions/side effects for all the Medicines you take and that have been prescribed to you. Take any new Medicines after you have completely understood and accpet all the possible adverse reactions/side effects.   Do not drive when taking Pain medications or sleeping medications (Benzodaizepines)  Do not take more than prescribed Pain, Sleep and Anxiety Medications. It is not advisable to combine anxiety,sleep and pain medications without talking with your primary care practitioner  Special  Instructions: If you have smoked or chewed Tobacco  in the last 2 yrs please stop smoking, stop any regular Alcohol  and or any Recreational drug use.  Wear Seat belts while driving.  Please note: You were cared for by a hospitalist during your hospital stay. Once you are discharged, your primary care physician will handle any further medical issues. Please note that NO REFILLS for any discharge medications will be authorized once you are discharged, as it is imperative that you return to your primary care physician (or establish a relationship with a primary care physician if you do not have one) for your post hospital discharge needs so that they can reassess your need for medications and monitor your lab values.   Increase activity slowly   Complete by: As directed      Allergies as of 06/07/2020   No Known Allergies     Medication List    STOP taking these medications   furosemide 20 MG tablet Commonly known as: Lasix   metoprolol tartrate 25 MG tablet Commonly known as: LOPRESSOR     TAKE these medications   acetaminophen 325 MG tablet Commonly known as: TYLENOL Take 2 tablets (650 mg total) by mouth every 6 (six) hours as needed for mild pain, moderate pain or fever (or Fever >/= 101).   amLODipine 5 MG tablet Commonly known as: NORVASC Take 1.5 tablets (7.5 mg total) by mouth daily.   aspirin 81 MG EC tablet Take 1 tablet (81 mg total) by mouth daily.   busPIRone 15 MG tablet Commonly known as: BUSPAR Take 15 mg by mouth 3 (three) times daily as needed for anxiety.   carvedilol 12.5 MG tablet Commonly known as: COREG Take 1 tablet (12.5 mg total) by mouth 2 (two) times daily with a meal.   cloNIDine 0.2 MG tablet Commonly known as: CATAPRES Take 0.2 mg by mouth at bedtime.   clopidogrel 75 MG tablet Commonly known as: PLAVIX Take 1 tablet (75 mg total) by mouth daily.   diclofenac Sodium 1 % Gel Commonly known as: VOLTAREN Apply 2 g topically 4 (four) times  daily.   dicyclomine 20 MG tablet Commonly known as: BENTYL Take 20 mg by mouth 3 (three) times daily.   donepezil 5 MG tablet Commonly known as: ARICEPT Take 5 mg by mouth daily.   DULoxetine 30 MG capsule Commonly known as: CYMBALTA Take 30 mg by mouth daily.   feeding supplement (ENSURE ENLIVE) Liqd Take 237 mLs by mouth 2 (two) times daily between meals. What changed: Another medication with the same name was added. Make sure you understand how and when to take each.   (feeding supplement) PROSource Plus liquid Take 30 mLs by mouth 2 (two) times daily between meals. What changed: You were already  taking a medication with the same name, and this prescription was added. Make sure you understand how and when to take each.   gabapentin 100 MG capsule Commonly known as: NEURONTIN Take 100 mg by mouth 3 (three) times daily.   hydrOXYzine 25 MG tablet Commonly known as: ATARAX/VISTARIL Take 25 mg by mouth daily.   Iron High-Potency 325 MG Tabs Take 325 mg by mouth daily with breakfast.   megestrol 40 MG tablet Commonly known as: MEGACE Take 40 mg by mouth 2 (two) times daily.   methocarbamol 500 MG tablet Commonly known as: ROBAXIN Take 500 mg by mouth 3 (three) times daily.   multivitamin with minerals Tabs tablet Take 1 tablet by mouth daily.   nitroGLYCERIN 0.4 MG SL tablet Commonly known as: NITROSTAT Place 1 tablet (0.4 mg total) under the tongue every 5 (five) minutes as needed for chest pain.   pantoprazole 40 MG tablet Commonly known as: PROTONIX Take 1 tablet (40 mg total) by mouth daily.   Poly-Iron 150 Forte 150-0.025-1 MG Caps Generic drug: Iron Polysacch Cmplx-B12-FA Take 1 capsule by mouth daily.   predniSONE 10 MG tablet Commonly known as: DELTASONE Take  30 mg daily for 1 day, 20 mg daily for 1 days,10 mg daily for 1 day, then stop   ProAir HFA 108 (90 Base) MCG/ACT inhaler Generic drug: albuterol Inhale 2 puffs into the lungs every 6 (six)  hours as needed for wheezing or shortness of breath.   rosuvastatin 40 MG tablet Commonly known as: CRESTOR Take 1 tablet (40 mg total) by mouth at bedtime. What changed: when to take this   sodium bicarbonate 650 MG tablet Take 2 tablets (1,300 mg total) by mouth 2 (two) times daily.   valproic acid 250 MG capsule Commonly known as: DEPAKENE Take 250 mg by mouth at bedtime.       Follow-up Information    [name], DO. Schedule an appointment as soon as possible for a visit in 1 week(s).   Specialty: General Practice Contact information: [place] [address] [phone]        Elam Dutch, MD. Schedule an appointment as soon as possible for a visit in 2 week(s).   Specialties: Vascular Surgery, Cardiology Contact information: [place] [address] [phone]        Rexene Agent, MD. Schedule an appointment as soon as possible for a visit in 2 week(s).   Specialty: Nephrology Contact information: [place] [address] [phone]              No Known Allergies   Other Procedures/Studies: CT ABDOMEN PELVIS WO CONTRAST  Result Date: 06/02/2020 CLINICAL DATA:  Shortness of breath, chest pain and abdominal pain. EXAM: CT ABDOMEN AND PELVIS WITHOUT CONTRAST TECHNIQUE: Multidetector CT imaging of the abdomen and pelvis was performed following the standard protocol without IV contrast. COMPARISON:  May 02, 2020 and November 01, 2019 FINDINGS: Lower chest: A cluster of 2 mm noncalcified lung nodules are seen within the posterolateral aspect of the right lung base. Hepatobiliary: No focal liver abnormality is seen. No gallstones, gallbladder wall thickening, or biliary dilatation. Pancreas: Unremarkable. No pancreatic ductal dilatation or surrounding inflammatory changes. Spleen: Normal in size without focal abnormality. Adrenals/Urinary Tract: Adrenal glands are unremarkable. Kidneys are normal, without renal  calculi, focal lesion, or hydronephrosis. Bladder is unremarkable. Stomach/Bowel: There is a large gastric hernia. Appendix appears normal. No evidence of bowel dilatation. Noninflamed diverticula are seen throughout  the sigmoid colon. Vascular/Lymphatic: There is marked severity calcification of the abdominal aorta and bilateral common iliac arteries, without evidence of aneurysmal dilatation. 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 A predominant stable 2.8 cm x 2.4 cm mildly lobulated area of heterogeneous low attenuation is seen within the region anterior to the right common femoral artery (axial CT image 58, CT series number 3). Reproductive: The uterus is mildly enlarged and heterogeneous in appearance. The bilateral adnexa are limited in evaluation secondary to streak artifact. Other: No abdominal wall hernia or abnormality. No abdominopelvic ascites. Musculoskeletal: A total right hip replacement is seen with extensive amount of associated streak artifact. Subsequently limited evaluation of the adjacent osseous and soft tissue structures is noted. Bilateral metallic density pedicle screws are seen at the levels of L4 and L5. Metallic density operative material is seen along the L4-L5 intervertebral disc space. IMPRESSION: 1. Sigmoid diverticulosis without evidence of acute diverticulitis. 2. Postoperative changes within the lower lumbar spine. 3. Total right hip replacement. 4. Large gastric hernia. 5. Evidence of prior fem-fem bypass graft. 6. Predominant stable area of lobulated low-attenuation anterior to the expected region of the right common femoral artery. While this may be vascular in origin, the presence of a stable soft tissue mass cannot be excluded. Aortic Atherosclerosis (ICD10-I70.0). Electronically Signed   By: Virgina Norfolk M.D.   On: 06/02/2020 17:49   NM Pulmonary Perfusion  Result Date: 06/03/2020 CLINICAL DATA:  COVID-19 positive EXAM: NUCLEAR MEDICINE PERFUSION LUNG SCAN TECHNIQUE:  Perfusion images were obtained in multiple projections after intravenous injection of radiopharmaceutical. Ventilation scans intentionally deferred if perfusion scan and chest x-ray adequate for interpretation during COVID 19 epidemic. RADIOPHARMACEUTICALS:  4.2 mCi Tc-18m MAA IV COMPARISON:  Same day chest x-ray.  Perfusion scan 01/02/2020 FINDINGS: Physiologic distribution of radiotracer throughout both lungs. No perfusion defect. IMPRESSION: Normal perfusion scan without evidence for pulmonary embolism. Electronically Signed   By: Davina Poke D.O.   On: 06/03/2020 16:39   DG Chest Port 1 View  Result Date: 06/03/2020 CLINICAL DATA:  Elevated D-dimer. EXAM: PORTABLE CHEST 1 VIEW COMPARISON:  06/02/2020. FINDINGS: Mediastinum and hilar structures normal. Heart size normal. Mild bibasilar atelectasis/infiltrates. No pleural effusion or pneumothorax. Degenerative changes thoracic spine and both shoulders. No acute bony abnormality. IMPRESSION: Mild bibasilar atelectasis/infiltrates. Electronically Signed   By: Marcello Moores  Register   On: 06/03/2020 14:26   DG Chest Port 1 View  Result Date: 06/02/2020 CLINICAL DATA:  Shortness of breath, COVID positive contact EXAM: PORTABLE CHEST 1 VIEW COMPARISON:  03/14/2020 FINDINGS: No new consolidation or edema. Chronic interstitial prominence. No pleural effusion or pneumothorax. Stable cardiomediastinal contours with normal heart size. IMPRESSION: No acute process in the chest. Electronically Signed   By: Macy Mis M.D.   On: 06/02/2020 14:35   ECHOCARDIOGRAM COMPLETE  Result Date: 05/29/2020    ECHOCARDIOGRAM REPORT   Patient Name:   NANEA JARED Date of Exam: 05/29/2020 Medical Rec #:  [id number]            Height:       63.0 in Accession #:    [id number]           Weight:       98.2 lb Date of Birth:  05-22-1941           BSA:          1.429 m Patient Age:    63 years             BP:  142/86 mmHg Patient Gender: F                    HR:            92 bpm. Exam Location:  [address] Procedure: 2D Echo, 3D Echo, Cardiac Doppler, Color Doppler and Strain Analysis Indications:    I21.4 MI  History:        Patient has prior history of Echocardiogram examinations, most                 recent 01/03/2020. Previous Myocardial Infarction, PVD,                 Signs/Symptoms:Syncope; Risk Factors:Current Smoker and                 Dyslipidemia. CKD stage 4. Anemia.  Sonographer:    Jessee Avers, RDCS Referring Phys: [place]  1. Left ventricular ejection fraction, by estimation, is 55 to 60%. The left ventricle has normal function. The left ventricle has no regional wall motion abnormalities. There is severe concentric left ventricular hypertrophy. Left ventricular diastolic  parameters are indeterminate. Elevated left ventricular end-diastolic pressure.  2. Right ventricular systolic function is normal. The right ventricular size is normal. Tricuspid regurgitation signal is inadequate for assessing PA pressure.  3. The mitral valve is grossly normal. Trivial mitral valve regurgitation. No evidence of mitral stenosis.  4. The aortic valve is tricuspid. Aortic valve regurgitation is not visualized. No aortic stenosis is present.  5. Aortic dilatation noted. There is borderline dilatation of the ascending aorta measuring 38 mm. Comparison(s): 01/03/20 EF 60-65%. FINDINGS  Left Ventricle: Basal lateral wall incompletely visualized, cannot exclude focal wall motion abnormality. Left ventricular ejection fraction, by estimation, is 55 to 60%. The left ventricle has normal function. The left ventricle has no regional wall motion abnormalities. Global longitudinal strain performed but not reported based on interpreter judgement due to suboptimal tracking. The left ventricular internal cavity size was normal in size. There is severe concentric left ventricular hypertrophy. Left ventricular diastolic parameters are indeterminate. Elevated left  ventricular end-diastolic pressure. Right Ventricle: The right ventricular size is normal. No increase in right ventricular wall thickness. Right ventricular systolic function is normal. Tricuspid regurgitation signal is inadequate for assessing PA pressure. Left Atrium: Left atrial size was normal in size. Right Atrium: Right atrial size was normal in size. Pericardium: There is no evidence of pericardial effusion. Mitral Valve: The mitral valve is grossly normal. There is mild thickening of the mitral valve leaflet(s). There is mild calcification of the mitral valve leaflet(s). Trivial mitral valve regurgitation. No evidence of mitral valve stenosis. Tricuspid Valve: The tricuspid valve is normal in structure. Tricuspid valve regurgitation is trivial. No evidence of tricuspid stenosis. Aortic Valve: The aortic valve is tricuspid. Aortic valve regurgitation is not visualized. No aortic stenosis is present. Pulmonic Valve: The pulmonic valve was not well visualized. Pulmonic valve regurgitation is trivial. No evidence of pulmonic stenosis. Aorta: Aortic dilatation noted. There is borderline dilatation of the ascending aorta measuring 38 mm. Venous: The inferior vena cava was not well visualized. IAS/Shunts: The atrial septum is grossly normal.  LEFT VENTRICLE PLAX 2D LVIDd:         3.30 cm  Diastology LVIDs:         2.30 cm  LV e' lateral:   5.78 cm/s LV PW:         1.50 cm  LV E/e' lateral: 10.8 LV  IVS:        1.90 cm  LV e' medial:    2.40 cm/s LVOT diam:     2.00 cm  LV E/e' medial:  26.0 LV SV:         37 LV SV Index:   26       2D Longitudinal Strain LVOT Area:     3.14 cm 2D Strain GLS (A2C):   -15.7 %                         2D Strain GLS (A3C):   -10.0 %                         2D Strain GLS (A4C):   -10.1 %                         2D Strain GLS Avg:     -12.0 %                          3D Volume EF:                         3D EF:        58 %                         LV EDV:       76 ml                          LV ESV:       32 ml                         LV SV:        44 ml RIGHT VENTRICLE RV Basal diam:  2.60 cm RV S prime:     12.90 cm/s TAPSE (M-mode): 2.0 cm LEFT ATRIUM             Index       RIGHT ATRIUM           Index LA diam:        3.50 cm 2.45 cm/m  RA Pressure: 3.00 mmHg LA Vol (A2C):   15.1 ml 10.57 ml/m RA Area:     6.29 cm LA Vol (A4C):   14.5 ml 10.15 ml/m RA Volume:   9.35 ml   6.54 ml/m LA Biplane Vol: 14.7 ml 10.29 ml/m  AORTIC VALVE LVOT Vmax:   77.50 cm/s LVOT Vmean:  49.100 cm/s LVOT VTI:    0.117 m  AORTA Ao Root diam: 2.90 cm Ao Asc diam:  3.80 cm MITRAL VALVE                TRICUSPID VALVE                             Estimated RAP:  3.00 mmHg  MV E velocity: 62.40 cm/s   SHUNTS MV A velocity: 117.00 cm/s  Systemic VTI:  0.12 m MV E/A ratio:  0.53         Systemic Diam: 2.00 cm Buford Dresser MD Electronically signed by Buford Dresser MD Signature Date/Time: 05/29/2020/4:38:39 PM    Final    VAS  [place] LOWER EXTREMITY VENOUS (DVT)  Result Date: 06/04/2020  Lower Venous DVTStudy Other Indications: Covid positive with elevated d-dimer. Comparison Study: 12/12/18 LE venous - negative Performing Technologist: Velva Harman Sturdivant RDMS, RVT  Examination Guidelines: A complete evaluation includes B-mode imaging, spectral Doppler, color Doppler, and power Doppler as needed of all accessible portions of each vessel. Bilateral testing is considered an integral part of a complete examination. Limited examinations for reoccurring indications may be performed as noted. The reflux portion of the exam is performed with the patient in reverse Trendelenburg.  +---------+---------------+---------+-----------+----------+--------------+ RIGHT    CompressibilityPhasicitySpontaneityPropertiesThrombus Aging +---------+---------------+---------+-----------+----------+--------------+ CFV      Full           Yes      Yes                                  +---------+---------------+---------+-----------+----------+--------------+ SFJ      Full                                                        +---------+---------------+---------+-----------+----------+--------------+ FV Prox  Full                                                        +---------+---------------+---------+-----------+----------+--------------+ FV Mid   Full                                                        +---------+---------------+---------+-----------+----------+--------------+ FV DistalFull                                                        +---------+---------------+---------+-----------+----------+--------------+ PFV      Full                                                        +---------+---------------+---------+-----------+----------+--------------+ POP      Full           Yes      Yes                                 +---------+---------------+---------+-----------+----------+--------------+ PTV      Full                                                        +---------+---------------+---------+-----------+----------+--------------+ PERO     Full                                                        +---------+---------------+---------+-----------+----------+--------------+   +---------+---------------+---------+-----------+----------+--------------+  LEFT     CompressibilityPhasicitySpontaneityPropertiesThrombus Aging +---------+---------------+---------+-----------+----------+--------------+ CFV      Full           Yes      Yes                                 +---------+---------------+---------+-----------+----------+--------------+ SFJ      Full                                                        +---------+---------------+---------+-----------+----------+--------------+ FV Prox  Full                                                         +---------+---------------+---------+-----------+----------+--------------+ FV Mid   Full                                                        +---------+---------------+---------+-----------+----------+--------------+ FV DistalFull                                                        +---------+---------------+---------+-----------+----------+--------------+ PFV      Full                                                        +---------+---------------+---------+-----------+----------+--------------+ POP      Full           Yes      Yes                                 +---------+---------------+---------+-----------+----------+--------------+ PTV      Full                                                        +---------+---------------+---------+-----------+----------+--------------+ PERO     Full                                                        +---------+---------------+---------+-----------+----------+--------------+     Summary: BILATERAL: - No evidence of deep vein thrombosis seen in the lower extremities, bilaterally. -No evidence of popliteal cyst, bilaterally. 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   *See table(s) above for measurements and  observations. Electronically signed by Curt Jews MD on 06/04/2020 at 5:05:07 PM.    Final      TODAY-DAY OF DISCHARGE:  Subjective:   Nevin Bloodgood today has no headache,no chest abdominal pain,no new weakness tingling or numbness, feels much better wants to go home today.   Objective:   Blood pressure 136/90, pulse 78, temperature 98.7 F (37.1 C), temperature source Oral, resp. rate 18, height 5\' 3"  (1.6 m), weight 42.1 kg, SpO2 95 %.  Intake/Output Summary (Last 24 hours) at 06/07/2020 0948 Last data filed at 06/07/2020 0517 Gross per 24 hour  Intake 313 ml  Output 1100 ml  Net -787 ml   Filed Weights   06/03/20 1200 06/03/20 2219   Weight: 44.5 kg 42.1 kg    Exam: Awake Alert, Oriented *3, No new F.N deficits, Normal affect [place].AT,PERRAL Supple Neck,No JVD, No cervical lymphadenopathy appriciated.  Symmetrical Chest wall movement, Good air movement bilaterally, CTAB RRR,No Gallops,Rubs or new Murmurs, No Parasternal Heave +ve B.Sounds, Abd Soft, Non tender, No organomegaly appriciated, No rebound -guarding or rigidity. No Cyanosis, Clubbing or edema, No new Rash or bruise   PERTINENT RADIOLOGIC STUDIES: CT ABDOMEN PELVIS WO CONTRAST  Result Date: 06/02/2020 CLINICAL DATA:  Shortness of breath, chest pain and abdominal pain. EXAM: CT ABDOMEN AND PELVIS WITHOUT CONTRAST TECHNIQUE: Multidetector CT imaging of the abdomen and pelvis was performed following the standard protocol without IV contrast. COMPARISON:  May 02, 2020 and November 01, 2019 FINDINGS: Lower chest: A cluster of 2 mm noncalcified lung nodules are seen within the posterolateral aspect of the right lung base. Hepatobiliary: No focal liver abnormality is seen. No gallstones, gallbladder wall thickening, or biliary dilatation. Pancreas: Unremarkable. No pancreatic ductal dilatation or surrounding inflammatory changes. Spleen: Normal in size without focal abnormality. Adrenals/Urinary Tract: Adrenal glands are unremarkable. Kidneys are normal, without renal calculi, focal lesion, or hydronephrosis. Bladder is unremarkable. Stomach/Bowel: There is a large gastric hernia. Appendix appears normal. No evidence of bowel dilatation. Noninflamed diverticula are seen throughout the sigmoid colon. Vascular/Lymphatic: There is marked severity calcification of the abdominal aorta and bilateral common iliac arteries, without evidence of aneurysmal dilatation. 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 A predominant stable 2.8 cm x 2.4 cm mildly lobulated area of heterogeneous low attenuation is seen within the region anterior to the right common femoral artery (axial CT image 58, CT series  number 3). Reproductive: The uterus is mildly enlarged and heterogeneous in appearance. The bilateral adnexa are limited in evaluation secondary to streak artifact. Other: No abdominal wall hernia or abnormality. No abdominopelvic ascites. Musculoskeletal: A total right hip replacement is seen with extensive amount of associated streak artifact. Subsequently limited evaluation of the adjacent osseous and soft tissue structures is noted. Bilateral metallic density pedicle screws are seen at the levels of L4 and L5. Metallic density operative material is seen along the L4-L5 intervertebral disc space. IMPRESSION: 1. Sigmoid diverticulosis without evidence of acute diverticulitis. 2. Postoperative changes within the lower lumbar spine. 3. Total right hip replacement. 4. Large gastric hernia. 5. Evidence of prior fem-fem bypass graft. 6. Predominant stable area of lobulated low-attenuation anterior to the expected region of the right common femoral artery. While this may be vascular in origin, the presence of a stable soft tissue mass cannot be excluded. Aortic Atherosclerosis (ICD10-I70.0). Electronically Signed   By: Virgina Norfolk M.D.   On: 06/02/2020 17:49   NM Pulmonary Perfusion  Result Date: 06/03/2020 CLINICAL DATA:  COVID-19 positive EXAM: NUCLEAR MEDICINE PERFUSION  LUNG SCAN TECHNIQUE: Perfusion images were obtained in multiple projections after intravenous injection of radiopharmaceutical. Ventilation scans intentionally deferred if perfusion scan and chest x-ray adequate for interpretation during COVID 19 epidemic. RADIOPHARMACEUTICALS:  4.2 mCi Tc-8m MAA IV COMPARISON:  Same day chest x-ray.  Perfusion scan 01/02/2020 FINDINGS: Physiologic distribution of radiotracer throughout both lungs. No perfusion defect. IMPRESSION: Normal perfusion scan without evidence for pulmonary embolism. Electronically Signed   By: Davina Poke D.O.   On: 06/03/2020 16:39   DG Chest Port 1 View  Result Date:  06/03/2020 CLINICAL DATA:  Elevated D-dimer. EXAM: PORTABLE CHEST 1 VIEW COMPARISON:  06/02/2020. FINDINGS: Mediastinum and hilar structures normal. Heart size normal. Mild bibasilar atelectasis/infiltrates. No pleural effusion or pneumothorax. Degenerative changes thoracic spine and both shoulders. No acute bony abnormality. IMPRESSION: Mild bibasilar atelectasis/infiltrates. Electronically Signed   By: Marcello Moores  Register   On: 06/03/2020 14:26   DG Chest Port 1 View  Result Date: 06/02/2020 CLINICAL DATA:  Shortness of breath, COVID positive contact EXAM: PORTABLE CHEST 1 VIEW COMPARISON:  03/14/2020 FINDINGS: No new consolidation or edema. Chronic interstitial prominence. No pleural effusion or pneumothorax. Stable cardiomediastinal contours with normal heart size. IMPRESSION: No acute process in the chest. Electronically Signed   By: Macy Mis M.D.   On: 06/02/2020 14:35   ECHOCARDIOGRAM COMPLETE  Result Date: 05/29/2020    ECHOCARDIOGRAM REPORT   Patient Name:   Alyssa [name] Date of Exam: 05/29/2020 Medical Rec #:  [id number]            Height:       63.0 in Accession #:    [id number]           Weight:       98.2 lb Date of Birth:  02-07-1941           BSA:          1.429 m Patient Age:    22 years             BP:           142/86 mmHg Patient Gender: F                    HR:           92 bpm. Exam Location:  [address] Procedure: 2D Echo, 3D Echo, Cardiac Doppler, Color Doppler and Strain Analysis Indications:    I21.4 MI  History:        Patient has prior history of Echocardiogram examinations, most                 recent 01/03/2020. Previous Myocardial Infarction, PVD,                 Signs/Symptoms:Syncope; Risk Factors:Current Smoker and                 Dyslipidemia. CKD stage 4. Anemia.  Sonographer:    Jessee Avers, RDCS Referring Phys: [place]  1. Left ventricular ejection fraction, by estimation, is 55 to 60%. The left ventricle has normal function. The left  ventricle has no regional wall motion abnormalities. There is severe concentric left ventricular hypertrophy. Left ventricular diastolic  parameters are indeterminate. Elevated left ventricular end-diastolic pressure.  2. Right ventricular systolic function is normal. The right ventricular size is normal. Tricuspid regurgitation signal is inadequate for assessing PA pressure.  3. The mitral valve is grossly normal. Trivial mitral valve regurgitation. No evidence of mitral stenosis.  4. The aortic valve is tricuspid. Aortic valve regurgitation is not visualized. No aortic stenosis is present.  5. Aortic dilatation noted. There is borderline dilatation of the ascending aorta measuring 38 mm. Comparison(s): 01/03/20 EF 60-65%. FINDINGS  Left Ventricle: Basal lateral wall incompletely visualized, cannot exclude focal wall motion abnormality. Left ventricular ejection fraction, by estimation, is 55 to 60%. The left ventricle has normal function. The left ventricle has no regional wall motion abnormalities. Global longitudinal strain performed but not reported based on interpreter judgement due to suboptimal tracking. The left ventricular internal cavity size was normal in size. There is severe concentric left ventricular hypertrophy. Left ventricular diastolic parameters are indeterminate. Elevated left ventricular end-diastolic pressure. Right Ventricle: The right ventricular size is normal. No increase in right ventricular wall thickness. Right ventricular systolic function is normal. Tricuspid regurgitation signal is inadequate for assessing PA pressure. Left Atrium: Left atrial size was normal in size. Right Atrium: Right atrial size was normal in size. Pericardium: There is no evidence of pericardial effusion. Mitral Valve: The mitral valve is grossly normal. There is mild thickening of the mitral valve leaflet(s). There is mild calcification of the mitral valve leaflet(s). Trivial mitral valve regurgitation. No  evidence of mitral valve stenosis. Tricuspid Valve: The tricuspid valve is normal in structure. Tricuspid valve regurgitation is trivial. No evidence of tricuspid stenosis. Aortic Valve: The aortic valve is tricuspid. Aortic valve regurgitation is not visualized. No aortic stenosis is present. Pulmonic Valve: The pulmonic valve was not well visualized. Pulmonic valve regurgitation is trivial. No evidence of pulmonic stenosis. Aorta: Aortic dilatation noted. There is borderline dilatation of the ascending aorta measuring 38 mm. Venous: The inferior vena cava was not well visualized. IAS/Shunts: The atrial septum is grossly normal.  LEFT VENTRICLE PLAX 2D LVIDd:         3.30 cm  Diastology LVIDs:         2.30 cm  LV e' lateral:   5.78 cm/s LV PW:         1.50 cm  LV E/e' lateral: 10.8 LV IVS:        1.90 cm  LV e' medial:    2.40 cm/s LVOT diam:     2.00 cm  LV E/e' medial:  26.0 LV SV:         37 LV SV Index:   26       2D Longitudinal Strain LVOT Area:     3.14 cm 2D Strain GLS (A2C):   -15.7 %                         2D Strain GLS (A3C):   -10.0 %                         2D Strain GLS (A4C):   -10.1 %                         2D Strain GLS Avg:     -12.0 %                          3D Volume EF:                         3D EF:        58 %  LV EDV:       76 ml                         LV ESV:       32 ml                         LV SV:        44 ml RIGHT VENTRICLE RV Basal diam:  2.60 cm RV S prime:     12.90 cm/s TAPSE (M-mode): 2.0 cm LEFT ATRIUM             Index       RIGHT ATRIUM           Index LA diam:        3.50 cm 2.45 cm/m  RA Pressure: 3.00 mmHg LA Vol (A2C):   15.1 ml 10.57 ml/m RA Area:     6.29 cm LA Vol (A4C):   14.5 ml 10.15 ml/m RA Volume:   9.35 ml   6.54 ml/m LA Biplane Vol: 14.7 ml 10.29 ml/m  AORTIC VALVE LVOT Vmax:   77.50 cm/s LVOT Vmean:  49.100 cm/s LVOT VTI:    0.117 m  AORTA Ao Root diam: 2.90 cm Ao Asc diam:  3.80 cm MITRAL VALVE                TRICUSPID VALVE                              Estimated RAP:  3.00 mmHg  MV E velocity: 62.40 cm/s   SHUNTS MV A velocity: 117.00 cm/s  Systemic VTI:  0.12 m MV E/A ratio:  0.53         Systemic Diam: 2.00 cm Buford Dresser MD Electronically signed by Buford Dresser MD Signature Date/Time: 05/29/2020/4:38:39 PM    Final    VAS [place] LOWER EXTREMITY VENOUS (DVT)  Result Date: 06/04/2020  Lower Venous DVTStudy Other Indications: Covid positive with elevated d-dimer. Comparison Study: 12/12/18 LE venous - negative Performing Technologist: Velva Harman Sturdivant RDMS, RVT  Examination Guidelines: A complete evaluation includes B-mode imaging, spectral Doppler, color Doppler, and power Doppler as needed of all accessible portions of each vessel. Bilateral testing is considered an integral part of a complete examination. Limited examinations for reoccurring indications may be performed as noted. The reflux portion of the exam is performed with the patient in reverse Trendelenburg.  +---------+---------------+---------+-----------+----------+--------------+ RIGHT    CompressibilityPhasicitySpontaneityPropertiesThrombus Aging +---------+---------------+---------+-----------+----------+--------------+ CFV      Full           Yes      Yes                                 +---------+---------------+---------+-----------+----------+--------------+ SFJ      Full                                                        +---------+---------------+---------+-----------+----------+--------------+ FV Prox  Full                                                        +---------+---------------+---------+-----------+----------+--------------+  FV Mid   Full                                                        +---------+---------------+---------+-----------+----------+--------------+ FV DistalFull                                                         +---------+---------------+---------+-----------+----------+--------------+ PFV      Full                                                        +---------+---------------+---------+-----------+----------+--------------+ POP      Full           Yes      Yes                                 +---------+---------------+---------+-----------+----------+--------------+ PTV      Full                                                        +---------+---------------+---------+-----------+----------+--------------+ PERO     Full                                                        +---------+---------------+---------+-----------+----------+--------------+   +---------+---------------+---------+-----------+----------+--------------+ LEFT     CompressibilityPhasicitySpontaneityPropertiesThrombus Aging +---------+---------------+---------+-----------+----------+--------------+ CFV      Full           Yes      Yes                                 +---------+---------------+---------+-----------+----------+--------------+ SFJ      Full                                                        +---------+---------------+---------+-----------+----------+--------------+ FV Prox  Full                                                        +---------+---------------+---------+-----------+----------+--------------+ FV Mid   Full                                                        +---------+---------------+---------+-----------+----------+--------------+  FV DistalFull                                                        +---------+---------------+---------+-----------+----------+--------------+ PFV      Full                                                        +---------+---------------+---------+-----------+----------+--------------+ POP      Full           Yes      Yes                                  +---------+---------------+---------+-----------+----------+--------------+ PTV      Full                                                        +---------+---------------+---------+-----------+----------+--------------+ PERO     Full                                                        +---------+---------------+---------+-----------+----------+--------------+     Summary: BILATERAL: - No evidence of deep vein thrombosis seen in the lower extremities, bilaterally. -No evidence of popliteal cyst, bilaterally. 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   *See table(s) above for measurements and observations. Electronically signed by Curt Jews MD on 06/04/2020 at 5:05:07 PM.    Final      PERTINENT LAB RESULTS: CBC: Recent Labs    06/06/20 0628 06/07/20 0651  WBC 11.7* 12.4*  HGB 6.7* 9.2*  HCT 19.1* 26.6*  PLT 331 318   CMET CMP     Component Value Date/Time   NA 135 06/07/2020 0651   K 3.7 06/07/2020 0651   CL 98 06/07/2020 0651   CO2 23 06/07/2020 0651   GLUCOSE 136 (H) 06/07/2020 0651   BUN 80 (H) 06/07/2020 0651   CREATININE 3.04 (H) 06/07/2020 0651   CALCIUM 8.5 (L) 06/07/2020 0651   PROT 5.3 (L) 06/07/2020 0651   ALBUMIN 2.4 (L) 06/07/2020 0651   AST 29 06/07/2020 0651   ALT 20 06/07/2020 0651   ALKPHOS 69 06/07/2020 0651   BILITOT 0.6 06/07/2020 0651   GFRNONAA 14 (L) 06/07/2020 0651   GFRAA 17 (L) 06/07/2020 0651    GFR Estimated Creatinine Clearance: 10.5 mL/min (A) (by C-G formula based on SCr of 3.04 mg/dL (H)). No results for input(s): LIPASE, AMYLASE in the last 72 hours. No results for input(s): CKTOTAL, CKMB, CKMBINDEX, TROPONINI in the last 72 hours. Invalid input(s): POCBNP Recent Labs    06/05/20 0337 06/06/20 0628  DDIMER 3.77* 3.70*   No results for input(s): HGBA1C in the last 72 hours. No results for input(s): CHOL, HDL, LDLCALC, TRIG, CHOLHDL, LDLDIRECT in the  last 72 hours. No  results for input(s): TSH, T4TOTAL, T3FREE, THYROIDAB in the last 72 hours.  Invalid input(s): FREET3 Recent Labs    06/05/20 0337 06/06/20 0628  FERRITIN 2,283* 2,215*   Coags: No results for input(s): INR in the last 72 hours.  Invalid input(s): PT Microbiology: Recent Results (from the past 240 hour(s))  SARS Coronavirus 2 by RT PCR (hospital order, performed in [facility] hospital lab) Nasopharyngeal Nasopharyngeal Swab     Status: Abnormal   Collection Time: 06/02/20  2:15 PM   Specimen: Nasopharyngeal Swab  Result Value Ref Range Status   SARS Coronavirus 2 POSITIVE (A) NEGATIVE Final    Comment: RESULT CALLED TO, READ BACK BY AND VERIFIED WITH: E,ADKINS @1604  06/02/20 EB (NOTE) SARS-CoV-2 target nucleic acids are DETECTED  SARS-CoV-2 RNA is generally detectable in upper respiratory specimens  during the acute phase of infection.  Positive results are indicative  of the presence of the identified virus, but do not rule out bacterial infection or co-infection with other pathogens not detected by the test.  Clinical correlation with patient history and  other diagnostic information is necessary to determine patient infection status.  The expected result is negative.  Fact Sheet for Patients:   StrictlyIdeas.no   Fact Sheet for Healthcare Providers:   BankingDealers.co.za    This test is not yet approved or cleared by the [place] FDA and  has been authorized for detection and/or diagnosis of SARS-CoV-2 by FDA under an Emergency Use Authorization (EUA).  This EUA will remain in effect (meaning this test can be  used) for the duration of  the COVID-19 declaration under Section 564(b)(1) of the Act, 21 U.S.C. section 360-bbb-3(b)(1), unless the authorization is terminated or revoked sooner.  Performed at [facility], [place] [address], [place], [place] [address]   Culture, Urine     Status: Abnormal    Collection Time: 06/02/20  9:15 PM   Specimen: Urine, Random  Result Value Ref Range Status   Specimen Description URINE, RANDOM  Final   Special Requests   Final    NONE Performed at [facility], [place] [address] [place], [place] [address]    Culture >=100,000 COLONIES/mL ESCHERICHIA COLI (A)  Final   Report Status 06/04/2020 FINAL  Final   Organism ID, Bacteria ESCHERICHIA COLI (A)  Final      Susceptibility   Escherichia coli - MIC*    AMPICILLIN >=32 RESISTANT Resistant     CEFAZOLIN <=4 SENSITIVE Sensitive     CEFTRIAXONE <=0.25 SENSITIVE Sensitive     CIPROFLOXACIN <=0.25 SENSITIVE Sensitive     GENTAMICIN <=1 SENSITIVE Sensitive     IMIPENEM <=0.25 SENSITIVE Sensitive     NITROFURANTOIN <=16 SENSITIVE Sensitive     TRIMETH/SULFA <=20 SENSITIVE Sensitive     AMPICILLIN/SULBACTAM >=32 RESISTANT Resistant     PIP/TAZO <=4 SENSITIVE Sensitive     * >=100,000 COLONIES/mL ESCHERICHIA COLI    FURTHER DISCHARGE INSTRUCTIONS:  Get Medicines reviewed and adjusted: Please take all your medications with you for your next visit with your Primary MD  Laboratory/radiological data: Please request your Primary MD to go over all hospital tests and procedure/radiological results at the follow up, please ask your Primary MD to get all Hospital records sent to his/her office.  In some cases, they will be blood work, cultures and biopsy results pending at the time of your discharge. Please request that your primary care M.D. goes through all the records of your  hospital data and follows up on these results.  Also Note the following: If you experience worsening of your admission symptoms, develop shortness of breath, life threatening emergency, suicidal or homicidal thoughts you must seek medical attention immediately by calling 911 or calling your MD immediately  if symptoms less severe.  You must read complete instructions/literature along with all the possible adverse reactions/side  effects for all the Medicines you take and that have been prescribed to you. Take any new Medicines after you have completely understood and accpet all the possible adverse reactions/side effects.   Do not drive when taking Pain medications or sleeping medications (Benzodaizepines)  Do not take more than prescribed Pain, Sleep and Anxiety Medications. It is not advisable to combine anxiety,sleep and pain medications without talking with your primary care practitioner  Special Instructions: If you have smoked or chewed Tobacco  in the last 2 yrs please stop smoking, stop any regular Alcohol  and or any Recreational drug use.  Wear Seat belts while driving.  Please note: You were cared for by a hospitalist during your hospital stay. Once you are discharged, your primary care physician will handle any further medical issues. Please note that NO REFILLS for any discharge medications will be authorized once you are discharged, as it is imperative that you return to your primary care physician (or establish a relationship with a primary care physician if you do not have one) for your post hospital discharge needs so that they can reassess your need for medications and monitor your lab values.  Total Time spent coordinating discharge including counseling, education and face to face time equals 35 minutes.  SignedOren Binet 06/07/2020 9:48 AM

## 2020-06-07 NOTE — Progress Notes (Signed)
Pt given discharge instructions, prescriptions, and care notes. Pt verbalized understanding AEB no further questions or concerns at this time. IV was discontinued, no redness, pain, or swelling noted at this time. Telemetry discontinued and Centralized Telemetry was notified. Pt left the floor via wheelchair with staff in stable condition. 

## 2020-06-09 ENCOUNTER — Emergency Department (HOSPITAL_COMMUNITY): Payer: Medicare HMO

## 2020-06-09 ENCOUNTER — Emergency Department (HOSPITAL_COMMUNITY)
Admission: EM | Admit: 2020-06-09 | Discharge: 2020-06-09 | Disposition: A | Discharge status: Home / Self Care | Payer: Medicare HMO | Attending: Emergency Medicine | Admitting: Emergency Medicine

## 2020-06-09 ENCOUNTER — Other Ambulatory Visit: Payer: Self-pay

## 2020-06-09 DIAGNOSIS — J441 Chronic obstructive pulmonary disease with (acute) exacerbation: Secondary | ICD-10-CM | POA: Diagnosis not present

## 2020-06-09 DIAGNOSIS — Z7982 Long term (current) use of aspirin: Secondary | ICD-10-CM | POA: Diagnosis not present

## 2020-06-09 DIAGNOSIS — F17210 Nicotine dependence, cigarettes, uncomplicated: Secondary | ICD-10-CM | POA: Diagnosis not present

## 2020-06-09 DIAGNOSIS — Z79899 Other long term (current) drug therapy: Secondary | ICD-10-CM | POA: Insufficient documentation

## 2020-06-09 DIAGNOSIS — R0602 Shortness of breath: Secondary | ICD-10-CM | POA: Insufficient documentation

## 2020-06-09 DIAGNOSIS — Z8616 Personal history of COVID-19: Secondary | ICD-10-CM | POA: Insufficient documentation

## 2020-06-09 DIAGNOSIS — F0390 Unspecified dementia without behavioral disturbance: Secondary | ICD-10-CM | POA: Insufficient documentation

## 2020-06-09 DIAGNOSIS — U071 COVID-19: Secondary | ICD-10-CM

## 2020-06-09 DIAGNOSIS — R197 Diarrhea, unspecified: Secondary | ICD-10-CM | POA: Diagnosis present

## 2020-06-09 DIAGNOSIS — I129 Hypertensive chronic kidney disease with stage 1 through stage 4 chronic kidney disease, or unspecified chronic kidney disease: Secondary | ICD-10-CM | POA: Insufficient documentation

## 2020-06-09 DIAGNOSIS — R062 Wheezing: Secondary | ICD-10-CM | POA: Diagnosis not present

## 2020-06-09 DIAGNOSIS — Z96651 Presence of right artificial knee joint: Secondary | ICD-10-CM | POA: Diagnosis not present

## 2020-06-09 DIAGNOSIS — R112 Nausea with vomiting, unspecified: Secondary | ICD-10-CM | POA: Insufficient documentation

## 2020-06-09 DIAGNOSIS — N184 Chronic kidney disease, stage 4 (severe): Secondary | ICD-10-CM | POA: Insufficient documentation

## 2020-06-09 DIAGNOSIS — R6883 Chills (without fever): Secondary | ICD-10-CM | POA: Diagnosis not present

## 2020-06-09 DIAGNOSIS — R05 Cough: Secondary | ICD-10-CM | POA: Diagnosis not present

## 2020-06-09 LAB — COMPREHENSIVE METABOLIC PANEL
ALT: 24 U/L (ref 0–44)
AST: 29 U/L (ref 15–41)
Albumin: 2.5 g/dL — ABNORMAL LOW (ref 3.5–5.0)
Alkaline Phosphatase: 64 U/L (ref 38–126)
Anion gap: 10 (ref 5–15)
BUN: 59 mg/dL — ABNORMAL HIGH (ref 8–23)
CO2: 22 mmol/L (ref 22–32)
Calcium: 8.4 mg/dL — ABNORMAL LOW (ref 8.9–10.3)
Chloride: 107 mmol/L (ref 98–111)
Creatinine, Ser: 2.23 mg/dL — ABNORMAL HIGH (ref 0.44–1.00)
GFR calc Af Amer: 24 mL/min — ABNORMAL LOW (ref >60)
GFR calc non Af Amer: 21 mL/min — ABNORMAL LOW (ref >60)
Glucose, Bld: 124 mg/dL — ABNORMAL HIGH (ref 70–99)
Potassium: 3.9 mmol/L (ref 3.5–5.1)
Sodium: 139 mmol/L (ref 135–145)
Total Bilirubin: 0.4 mg/dL (ref 0.3–1.2)
Total Protein: 5.5 g/dL — ABNORMAL LOW (ref 6.5–8.1)

## 2020-06-09 LAB — CBC WITH DIFFERENTIAL/PLATELET
Abs Immature Granulocytes: 0.16 10*3/uL — ABNORMAL HIGH (ref 0.00–0.07)
Basophils Absolute: 0 10*3/uL (ref 0.0–0.1)
Basophils Relative: 0 %
Eosinophils Absolute: 0.3 10*3/uL (ref 0.0–0.5)
Eosinophils Relative: 3 %
HCT: 29.2 % — ABNORMAL LOW (ref 36.0–46.0)
Hemoglobin: 9.6 g/dL — ABNORMAL LOW (ref 12.0–15.0)
Immature Granulocytes: 1 %
Lymphocytes Relative: 11 %
Lymphs Abs: 1.3 10*3/uL (ref 0.7–4.0)
MCH: 28.5 pg (ref 26.0–34.0)
MCHC: 32.9 g/dL (ref 30.0–36.0)
MCV: 86.6 fL (ref 80.0–100.0)
Monocytes Absolute: 0.8 10*3/uL (ref 0.1–1.0)
Monocytes Relative: 7 %
Neutro Abs: 8.7 10*3/uL — ABNORMAL HIGH (ref 1.7–7.7)
Neutrophils Relative %: 78 %
Platelets: 363 10*3/uL (ref 150–400)
RBC: 3.37 MIL/uL — ABNORMAL LOW (ref 3.87–5.11)
RDW: 14.6 % (ref 11.5–15.5)
WBC: 11.3 10*3/uL — ABNORMAL HIGH (ref 4.0–10.5)
nRBC: 0.9 % — ABNORMAL HIGH (ref 0.0–0.2)

## 2020-06-09 LAB — LACTIC ACID, PLASMA: Lactic Acid, Venous: 0.7 mmol/L (ref 0.5–1.9)

## 2020-06-09 LAB — D-DIMER, QUANTITATIVE: D-Dimer, Quant: 6.83 ug/mL-FEU — ABNORMAL HIGH (ref 0.00–0.50)

## 2020-06-09 LAB — C-REACTIVE PROTEIN: CRP: 1.5 mg/dL — ABNORMAL HIGH (ref <1.0)

## 2020-06-09 LAB — LACTATE DEHYDROGENASE: LDH: 306 U/L — ABNORMAL HIGH (ref 98–192)

## 2020-06-09 LAB — FERRITIN: Ferritin: 1498 ng/mL — ABNORMAL HIGH (ref 11–307)

## 2020-06-09 LAB — TRIGLYCERIDES: Triglycerides: 234 mg/dL — ABNORMAL HIGH (ref <150)

## 2020-06-09 LAB — PROCALCITONIN: Procalcitonin: 0.23 ng/mL

## 2020-06-09 LAB — FIBRINOGEN: Fibrinogen: 508 mg/dL — ABNORMAL HIGH (ref 210–475)

## 2020-06-09 MED ORDER — AEROCHAMBER PLUS FLO-VU LARGE MISC: 1.0000 | Freq: Once | Status: AC

## 2020-06-09 MED ORDER — ALBUTEROL SULFATE HFA 108 (90 BASE) MCG/ACT IN AERS
4.0000 | INHALATION_SPRAY | Freq: Once | RESPIRATORY_TRACT | Status: AC
  Administered 2020-06-09: 4 via RESPIRATORY_TRACT
  Filled 2020-06-09: qty 6.7

## 2020-06-09 MED ORDER — DEXAMETHASONE SODIUM PHOSPHATE 10 MG/ML IJ SOLN
10.0000 mg | Freq: Once | INTRAMUSCULAR | Status: AC
  Administered 2020-06-09: 10 mg via INTRAVENOUS
  Filled 2020-06-09: qty 1

## 2020-06-09 MED ORDER — ONDANSETRON 4 MG PO TBDP
4.0000 mg | ORAL_TABLET | Freq: Once | ORAL | Status: AC
  Administered 2020-06-09: 4 mg via ORAL
  Filled 2020-06-09: qty 1

## 2020-06-09 MED ORDER — ONDANSETRON 4 MG PO TBDP: 4.0000 mg | ORAL_TABLET | ORAL | 0 refills | Status: DC | PRN

## 2020-06-09 MED ORDER — AEROCHAMBER PLUS FLO-VU LARGE MISC
Status: AC
  Administered 2020-06-09: 1
  Filled 2020-06-09: qty 1

## 2020-06-09 NOTE — ED Provider Notes (Signed)
[facility] EMERGENCY DEPARTMENT Provider Note   CSN: [id number] Arrival date & time: 06/09/20  1404     History Chief Complaint  Patient presents with  . Diarrhea  . Chills    Alyssa [name] is a 79 y.o. female.  HPI    79 year old female with a history of CKD, hyperlipidemia, hypertension, peripheral vascular disease, peripheral neuropathy, current Covid infection, who presents to the emergency department today for evaluation of diarrhea.  Patient states she has had some nausea vomiting and diarrhea.  She denies any severe abdominal pain but does have some mild discomfort.  She also reports chills and a cough.  She does feel somewhat short of breath.  She denies any significant chest pain.  Of note patient was recently admitted for Covid on 06/02/2020 and discharged on 06/07/2020.  2:37 PM Discussed case with the patients [name].  He states that today the patient has experienced diarrhea and chills which made him concerned therefore she was sent to the ED.  He states she was now discharged on oxygen.  Past Medical History:  Diagnosis Date  . Acute respiratory failure with hypoxia ([facility]) 11/01/2019  . Anxiety   . Arthritis   . Chest pain   . Chronic kidney disease   . Dyspnea   . Elevated troponin 12/13/2018  . Headache(784.0)   . Hyperlipidemia   . Hypertension   . Joint pain   . Leg pain   . Peripheral neuropathy   . Peripheral vascular disease [facility])     Patient Active Problem List   Diagnosis Date Noted  . Acute respiratory failure due to COVID-19 ([place]) 06/02/2020  . Acute renal failure superimposed on chronic kidney disease ([place]) 02/06/2020  . Leukocytosis 02/06/2020  . Thrombocytosis ([place]) 02/06/2020  . Palliative care encounter   . Constipation 01/05/2020  . Malnutrition of moderate degree ([place]) 01/04/2020  . Acute on chronic renal failure ([place]) 01/04/2020  . Hyperkalemia 01/03/2020  . AKI (acute kidney injury) ([place]) 01/03/2020    . Acute respiratory failure with hypoxia ([place]) 11/01/2019  . UTI (urinary tract infection) 11/01/2019  . CAP (community acquired pneumonia) 11/01/2019  . Acute cystitis without hematuria   . Encephalopathy 09/18/2019  . SAH (subarachnoid hemorrhage) ([place]) 09/18/2019  . Dementia ([place]) 09/18/2019  . Metabolic acidosis 38/75/6433  . ARF (acute renal failure) ([place]) 09/17/2019  . Acute encephalopathy 09/17/2019  . Abnormal CT scan, colon   . Diarrhea   . Diverticulitis 06/07/2019  . Depression with anxiety 06/07/2019  . DNR (do not resuscitate) 03/23/2019  . Volume overload 03/23/2019  . Edema 03/23/2019  . Acute renal failure (ARF) ([place]) 03/08/2019  . Hypokalemia 03/08/2019  . Palliative care by specialist   . Goals of care, counseling/discussion   . Altered mental status   . Encephalopathy, toxic 12/24/2018  . Aspiration pneumonia of both lower lobes due to gastric secretions ([place]) 12/24/2018  . Non-ST elevation (NSTEMI) myocardial infarction ([place])   . Chest pain   . Dyspnea on exertion   . Elevated troponin level 12/09/2018  . Chronic migraine 03/27/2018  . Lumbar radiculopathy 03/27/2018  . Gait abnormality 03/27/2018  . CKD (chronic kidney disease) stage 4, GFR 15-29 ml/min ([facility]) 12/28/2016  . Symptomatic anemia 12/28/2016  . Chronic pain syndrome 12/28/2016  . Benzodiazepine withdrawal without complication ([place]) 29/51/8841  . Chronic right shoulder pain 12/06/2016  . Fall 09/17/2016  . Fracture of humeral head, closed, right, initial encounter 09/17/2016  . Rib fractures 09/17/2016  .  Multiple falls 09/17/2016  . Severe muscle deconditioning 09/17/2016  . Failure to thrive in adult 09/17/2016  . Melena 04/25/2016  . Transient hypotension 04/25/2016  . Anxiety state 04/25/2016  . Tobacco abuse 04/25/2016  . Hyperlipidemia 04/25/2016  . Syncope 04/24/2016  . Near syncope 04/24/2016  . Gait difficulty 01/12/2016  . Foot drop, left 01/12/2016  . Severe recurrent  major depression without psychotic features ([place]) 08/28/2015  . Spondylolisthesis at L4-L5 level 07/30/2015  . PVD (peripheral vascular disease) ([place]) 07/29/2014  . Weakness-Bilateral arm/leg 07/29/2014  . Numbness-left leg 07/29/2014  . Swelling of limb-Legs 07/29/2014  . Atherosclerosis of native arteries of the extremities with intermittent claudication 09/30/2011    Past Surgical History:  Procedure Laterality Date  . ABDOMINAL ANGIOGRAM N/A 10/29/2011   Procedure: ABDOMINAL ANGIOGRAM;  Surgeon: Elam Dutch, MD;  Location: [facility] CATH LAB;  Service: Cardiovascular;  Laterality: N/A;  . BACK SURGERY    . FEMORAL-FEMORAL BYPASS GRAFT  08/31/2010  . FLEXIBLE SIGMOIDOSCOPY N/A 06/12/2019   Procedure: FLEXIBLE SIGMOIDOSCOPY;  Surgeon: Ladene Artist, MD;  Location: [facility] ENDOSCOPY;  Service: Endoscopy;  Laterality: N/A;  Patient had little bit to eat this morning so this will be unsedated.  Marland Kitchen JOINT REPLACEMENT Right 2012   Hip  . LOWER EXTREMITY ANGIOGRAPHY  06/14/2017   Procedure: Lower Extremity Angiography;  Surgeon: Adrian Prows, MD;  Location: [facility] CV LAB;  Service: Cardiovascular;;  Bilateral limited angio performed  . RENAL ANGIOGRAPHY N/A 06/14/2017   Procedure: Renal Angiography;  Surgeon: Adrian Prows, MD;  Location: [facility] CV LAB;  Service: Cardiovascular;  Laterality: N/A;  . TOTAL HIP ARTHROPLASTY     right     OB History   No obstetric history on file.     Family History  Problem Relation Age of Onset  . Heart attack Mother   . Heart disease Mother   . Hyperlipidemia Mother   . Hypertension Mother   . Heart attack Father   . Heart disease Father        Before age 49  . Hyperlipidemia Father   . Hypertension Father   . Cancer Sister        brain tumor  . Aneurysm Brother        brain  . Colon cancer Neg Hx   . Liver cancer Neg Hx     Social History   Tobacco Use  . Smoking status: Current Every Day Smoker    Packs/day: 1.00    Years: 27.00     Pack years: 27.00    Types: Cigarettes    Last attempt to quit: 07/23/2019    Years since quitting: 0.8  . Smokeless tobacco: Never Used  Vaping Use  . Vaping Use: Never used  Substance Use Topics  . Alcohol use: No  . Drug use: No    Home Medications Prior to Admission medications   Medication Sig Start Date End Date Taking? Authorizing Provider  acetaminophen (TYLENOL) 325 MG tablet Take 2 tablets (650 mg total) by mouth every 6 (six) hours as needed for mild pain, moderate pain or fever (or Fever >/= 101). 06/16/19   Hongalgi, Lenis Dickinson, MD  albuterol (PROAIR HFA) 108 (90 Base) MCG/ACT inhaler Inhale 2 puffs into the lungs every 6 (six) hours as needed for wheezing or shortness of breath.    [provider]  amLODipine (NORVASC) 5 MG tablet Take 1.5 tablets (7.5 mg total) by mouth daily. 04/14/20   Troy Sine, MD  aspirin 81 MG EC tablet Take 1 tablet (81 mg total) by mouth daily. 01/09/19   Caren Griffins, MD  busPIRone (BUSPAR) 15 MG tablet Take 15 mg by mouth 3 (three) times daily as needed for anxiety. 02/28/19   [provider]  carvedilol (COREG) 12.5 MG tablet Take 1 tablet (12.5 mg total) by mouth 2 (two) times daily with a meal. 02/10/20   Little Ishikawa, MD  cloNIDine (CATAPRES) 0.2 MG tablet Take 0.2 mg by mouth at bedtime. 05/25/20   [provider]  clopidogrel (PLAVIX) 75 MG tablet Take 1 tablet (75 mg total) by mouth daily. 01/09/19   Caren Griffins, MD  diclofenac Sodium (VOLTAREN) 1 % GEL Apply 2 g topically 4 (four) times daily. 01/15/20   [provider]  dicyclomine (BENTYL) 20 MG tablet Take 20 mg by mouth 3 (three) times daily. 04/01/20   [provider]  donepezil (ARICEPT) 5 MG tablet Take 5 mg by mouth daily.    [provider]  DULoxetine (CYMBALTA) 30 MG capsule Take 30 mg by mouth daily.    [provider]  feeding supplement, ENSURE ENLIVE, (ENSURE ENLIVE) LIQD Take 237 mLs by mouth 2 (two)  times daily between meals. 06/16/19   Hongalgi, Lenis Dickinson, MD  Ferrous Sulfate (IRON HIGH-POTENCY) 325 MG TABS Take 325 mg by mouth daily with breakfast. 06/30/19   Hongalgi, Lenis Dickinson, MD  gabapentin (NEURONTIN) 100 MG capsule Take 100 mg by mouth 3 (three) times daily. 10/25/19   [provider]  hydrOXYzine (ATARAX/VISTARIL) 25 MG tablet Take 25 mg by mouth daily. 05/12/20   [provider]  megestrol (MEGACE) 40 MG tablet Take 40 mg by mouth 2 (two) times daily. 05/12/20   [provider]  methocarbamol (ROBAXIN) 500 MG tablet Take 500 mg by mouth 3 (three) times daily. 05/12/20   [provider]  Multiple Vitamin (MULTIVITAMIN WITH MINERALS) TABS tablet Take 1 tablet by mouth daily. 06/17/19   Hongalgi, Lenis Dickinson, MD  nitroGLYCERIN (NITROSTAT) 0.4 MG SL tablet Place 1 tablet (0.4 mg total) under the tongue every 5 (five) minutes as needed for chest pain. 01/07/20   Allie Bossier, MD  Nutritional Supplements (,FEEDING SUPPLEMENT, PROSOURCE PLUS) liquid Take 30 mLs by mouth 2 (two) times daily between meals. 06/07/20 07/07/20  Ghimire, Henreitta Leber, MD  ondansetron (ZOFRAN ODT) 4 MG disintegrating tablet Take 1 tablet (4 mg total) by mouth every 4 (four) hours as needed for nausea or vomiting. 06/09/20   Charlesetta Shanks, MD  pantoprazole (PROTONIX) 40 MG tablet Take 1 tablet (40 mg total) by mouth daily. 06/16/19   Hongalgi, Lenis Dickinson, MD  POLY-IRON 150 FORTE 150-25-1 MG-MCG-MG CAPS Take 1 capsule by mouth daily. 04/02/20   [provider]  predniSONE (DELTASONE) 10 MG tablet Take  30 mg daily for 1 day, 20 mg daily for 1 days,10 mg daily for 1 day, then stop 06/07/20   Jonetta Osgood, MD  rosuvastatin (CRESTOR) 40 MG tablet Take 1 tablet (40 mg total) by mouth at bedtime. Patient taking differently: Take 40 mg by mouth daily.  06/16/19   Hongalgi, Lenis Dickinson, MD  sodium bicarbonate 650 MG tablet Take 2 tablets (1,300 mg total) by mouth 2 (two) times daily. 01/07/20   Allie Bossier, MD  valproic acid (DEPAKENE) 250 MG capsule Take 250 mg by mouth at bedtime. 05/12/20   [provider]    Allergies    Patient has no known  allergies.  Review of Systems   Review of Systems  Constitutional: Positive for chills and fever.  HENT: Negative for ear pain and sore throat.   Eyes: Negative for pain and visual disturbance.  Respiratory: Positive for cough and shortness of breath.   Cardiovascular: Negative for chest pain.  Gastrointestinal: Positive for abdominal pain, diarrhea, nausea and vomiting.  Genitourinary: Negative for dysuria and hematuria.  Musculoskeletal: Negative for back pain.  Skin: Negative for rash.  Neurological: Negative for headaches.  All other systems reviewed and are negative.   Physical Exam Updated Vital Signs BP (!) 167/95   Pulse 77   Temp 98.4 F (36.9 C) (Oral)   Resp 18   Ht 5\' 3"  (1.6 m)   Wt 44.5 kg   SpO2 100%   BMI 17.36 kg/m   Physical Exam Vitals and nursing note reviewed.  Constitutional:      General: She is not in acute distress.    Appearance: She is well-developed.  HENT:     Head: Normocephalic and atraumatic.  Eyes:     Conjunctiva/sclera: Conjunctivae normal.  Cardiovascular:     Rate and Rhythm: Normal rate and regular rhythm.     Heart sounds: No murmur heard.   Pulmonary:     Effort: No respiratory distress.     Breath sounds: Wheezing and rhonchi present.     Comments: tachypneic Abdominal:     General: Bowel sounds are normal.     Palpations: Abdomen is soft.     Tenderness: There is no abdominal tenderness. There is no guarding or rebound.  Musculoskeletal:     Cervical back: Neck supple.  Skin:    General: Skin is warm and dry.  Neurological:     Mental Status: She is alert.     ED Results / Procedures / Treatments   Labs (all labs ordered are listed, but only abnormal results are displayed) Labs Reviewed  CBC WITH DIFFERENTIAL/PLATELET - Abnormal; Notable for the  following components:      Result Value   WBC 11.3 (*)    RBC 3.37 (*)    Hemoglobin 9.6 (*)    HCT 29.2 (*)    nRBC 0.9 (*)    Neutro Abs 8.7 (*)    Abs Immature Granulocytes 0.16 (*)    All other components within normal limits  COMPREHENSIVE METABOLIC PANEL - Abnormal; Notable for the following components:   Glucose, Bld 124 (*)    BUN 59 (*)    Creatinine, Ser 2.23 (*)    Calcium 8.4 (*)    Total Protein 5.5 (*)    Albumin 2.5 (*)    GFR calc non Af Amer 21 (*)    GFR calc Af Amer 24 (*)    All other components within normal limits  D-DIMER, QUANTITATIVE (NOT AT [facility]) - Abnormal; Notable for the following components:   D-Dimer, Quant 6.83 (*)    All other components within normal limits  LACTATE DEHYDROGENASE - Abnormal; Notable for the following components:   LDH 306 (*)    All other components within normal limits  FERRITIN - Abnormal; Notable for the following components:   Ferritin 1,498 (*)    All other components within normal limits  TRIGLYCERIDES - Abnormal; Notable for the following components:   Triglycerides 234 (*)    All other components within normal limits  FIBRINOGEN - Abnormal; Notable for the following components:   Fibrinogen 508 (*)    All other components within normal limits  C-REACTIVE PROTEIN - Abnormal; Notable for the following components:   CRP 1.5 (*)    All other components within normal limits  LACTIC ACID, PLASMA  PROCALCITONIN    EKG EKG Interpretation  Date/Time:  Monday June 09 2020 14:16:36 EDT Ventricular Rate:  79 PR Interval:    QRS Duration: 84 QT Interval:  399 QTC Calculation: 458 R Axis:   67 Text Interpretation: Sinus rhythm Probable LVH with secondary repol abnrm no acute ischemic appearance, q wave seen on previous tracing resolved. low voltage aVl new. Confirmed by Charlesetta Shanks [phone]) on 06/09/2020 4:28:57 PM   Radiology DG Chest Port 1 View  Result Date: 06/09/2020 CLINICAL DATA:  Shortness of breath,  COVID-19 positivity, initial encounter EXAM: PORTABLE CHEST 1 VIEW COMPARISON:  06/03/2020 FINDINGS: Cardiac shadow is stable. Aortic calcifications are again seen. The lungs are well aerated bilaterally. No focal infiltrate or sizable effusion is seen. Old healed rib fractures are noted. IMPRESSION: No acute abnormality noted. Aortic Atherosclerosis (ICD10-I70.0). Electronically Signed   By: Inez Catalina M.D.   On: 06/09/2020 15:23    Procedures Procedures (including critical care time)  Medications Ordered in ED Medications  albuterol (VENTOLIN HFA) 108 (90 Base) MCG/ACT inhaler 4 puff (4 puffs Inhalation Given 06/09/20 1608)  dexamethasone (DECADRON) injection 10 mg (10 mg Intravenous Given 06/09/20 1609)  ondansetron (ZOFRAN-ODT) disintegrating tablet 4 mg (4 mg Oral Given 06/09/20 1608)  AeroChamber Plus Flo-Vu Large MISC 1 each (1 each Other Given 06/09/20 1609)    ED Course  I have reviewed the triage vital signs and the nursing notes.  Pertinent labs & imaging results that were available during my care of the patient were reviewed by me and considered in my medical decision making (see chart for details).    MDM Rules/Calculators/A&P                          79 year old female with known Covid infection recently discharged from the hospital coming in with diarrhea and chills.  She states she feels little more short of breath than when she was discharged from the hospital.  She is not hypoxic on my evaluation but is tachypneic.  She has some rhonchi and wheezing on exam and discharge summary indicates that she has COPD and had an exacerbation during her admission.  Will initiate work-up with labs, chest x-ray, EKG.  We will also give Decadron and albuterol.  Reviewed/interpreted labs CBC with mild leukocytosis, improving from prior, anemia present stable from prior CMP with elevated BUN/creatinine which is improving.  Normal LFTs and no significant electrolyte derangement. Lactic  negative Inflammatory markers are elevated but the majority of them are improving from prior  EKG with MSR, probably LVH with secondary repol  CXR reviewed/interpreted -with no acute abnormality noted.  Pt given decadron, abuterol. On reassessment, patient's wheezing has significantly improved and she states that her breathing is also improved.  We discussed the results of her work-up and plan for discharge home.  She is comfortable with the plan for discharge.  She is not currently requiring any oxygen and is satting at 100% on room air with only mild tachypnea but is able to speak in full sentences without any distress.  Have advised close follow-up with PCP and strict return precautions.  Patient was seen in conjunction with supervising physician, Dr. Vallery Ridge who personally evaluated the patient and is in agreement with this plan.  Final Clinical Impression(s) / ED Diagnoses Final diagnoses:  Diarrhea, unspecified type  Nausea and vomiting, intractability of vomiting not specified, unspecified vomiting type  COVID-19    Rx / DC Orders ED Discharge Orders         Ordered    ondansetron (ZOFRAN ODT) 4 MG disintegrating tablet  Every 4 hours PRN        06/09/20 1927           Rodney Booze, PA-C 06/09/20 2111    Charlesetta Shanks, MD 06/21/20 [phone]

## 2020-06-09 NOTE — Discharge Planning (Signed)
Pt currently active with [facility] for RN services.  Resumption of care requested.   No DME needs identified at this time.

## 2020-06-09 NOTE — ED Triage Notes (Signed)
Pt bib ems from home with reports of dx covid on 8/16, discharged yesterday. Pt now with worsening symptoms. According to family, pt lethargic, increased work of breathing. 20g LFA.  96% RA RR 30-40 with accessory muscle use

## 2020-06-09 NOTE — Discharge Instructions (Addendum)
1.  At this time your oxygen level is very good and your chest x-ray does not show any development of pneumonia. 2.  You may take Zofran as prescribed for nausea and vomiting.  Try to continue eating and drinking small amounts frequently to stay hydrated.  Take Tylenol if needed for body aches.  You were given an inhaler with a spacer.  Put 2 puffs of the inhaler into the spacer and inhale every 4 hours for the next 24 hours.  You may then use it as needed. 3.  Return to the emergency department if your symptoms are worsening or changing.

## 2020-06-09 NOTE — ED Provider Notes (Signed)
Medical screening examination/treatment/procedure(s) were conducted as a shared visit with non-physician practitioner(s) and myself.  I personally evaluated the patient during the encounter.  EKG Interpretation  Date/Time:  Monday June 09 2020 14:16:36 EDT Ventricular Rate:  79 PR Interval:    QRS Duration: 84 QT Interval:  399 QTC Calculation: 458 R Axis:   67 Text Interpretation: Sinus rhythm Probable LVH with secondary repol abnrm no acute ischemic appearance, q wave seen on previous tracing resolved. low voltage aVl new. Confirmed by Charlesetta Shanks [phone] on 06/09/2020 4:28:57 PM  Patient had hospitalization for Covid.  She was discharged 2 days ago.  She has been experiencing diarrhea and some vomiting at home.  She was not discharged on oxygen.  She reports she was feeling somewhat more weak and short of breath.  Patient is alert and appropriate.  Nontoxic in appearance.  No respiratory distress at rest but mild tachypnea.  Auscultation has good airflow to the bases with occasional expiratory wheeze.  Abdomen is soft and nontender without guarding.  Lower extremities have no peripheral edema.  Calves are soft and nontender.  Feet are warm and dry.  Diagnostic evaluation shows chest x-ray to be clear.  Patient has been weaned off oxygen placed by EMS and is 100% on room air.  Patient is at baseline renal function.  She is tolerating oral intake in the emergency department.  She has been given an albuterol inhaler with spacer to use every 4 hours.  Patient given 1 dose of Decadron and Zofran.  At this time I feel she is stable for continued home management.  Patient reports she is living home with a significant other.  She reports that partner did just test positive for Covid as well.  Return precautions reviewed.   Charlesetta Shanks, MD 06/09/20 [phone]

## 2020-06-09 NOTE — ED Notes (Signed)
Pt verbalized understanding of discharge instructions. Follow up care, prescriptions and pain management reviewed w/ pt and her significant other. No further questions, brought to lobby via wheelchair and left ED w/ husband.

## 2020-06-20 ENCOUNTER — Other Ambulatory Visit: Payer: Self-pay | Admitting: *Deleted

## 2020-06-20 ENCOUNTER — Other Ambulatory Visit: Payer: Medicare HMO

## 2020-06-20 ENCOUNTER — Other Ambulatory Visit: Payer: Self-pay

## 2020-06-20 DIAGNOSIS — Z20822 Contact with and (suspected) exposure to covid-19: Secondary | ICD-10-CM

## 2020-06-21 LAB — NOVEL CORONAVIRUS, NAA: SARS-CoV-2, NAA: DETECTED — AB

## 2020-06-22 ENCOUNTER — Telehealth (HOSPITAL_COMMUNITY): Payer: Self-pay | Admitting: Oncology

## 2020-06-22 NOTE — Telephone Encounter (Signed)
Called to Discuss with patient about Covid symptoms and the use of regeneron, a monoclonal antibody infusion for those with mild to moderate Covid symptoms and at a high risk of hospitalization.     Pt is qualified for this infusion at the [facility] infusion center due to co-morbid conditions and/or a member of an at-risk group.     Unable to reach pt. Spoke with Husband sne he states that she is doing well. She was sleeping and ask that I call back later.   Rulon Abide, AGNP-C [phone] (Rafael Gonzalez)

## 2020-06-24 ENCOUNTER — Telehealth: Payer: Self-pay | Admitting: *Deleted

## 2020-06-24 NOTE — Telephone Encounter (Signed)
Patient is calling for COVID test results- patient notified her result is still showing + COVID. Patient was tested + COVID 8/16 and admitted to the hospital. Patient informed that she can test + COVID for 90 days after the original diagnosis.

## 2020-06-30 ENCOUNTER — Other Ambulatory Visit: Payer: Self-pay

## 2020-06-30 ENCOUNTER — Ambulatory Visit
Admission: RE | Admit: 2020-06-30 | Discharge: 2020-06-30 | Disposition: A | Discharge status: Home / Self Care | Payer: Medicare HMO | Source: Ambulatory Visit | Attending: Family Medicine | Admitting: Family Medicine

## 2020-06-30 DIAGNOSIS — Z780 Asymptomatic menopausal state: Secondary | ICD-10-CM

## 2020-07-06 ENCOUNTER — Emergency Department (HOSPITAL_COMMUNITY): Payer: Medicare HMO

## 2020-07-06 ENCOUNTER — Inpatient Hospital Stay (HOSPITAL_COMMUNITY)
Admission: EM | Admit: 2020-07-06 | Discharge: 2020-07-10 | DRG: 640 | Disposition: A | Discharge status: Home / Self Care | Payer: Medicare HMO | Attending: Internal Medicine | Admitting: Internal Medicine

## 2020-07-06 ENCOUNTER — Inpatient Hospital Stay (HOSPITAL_COMMUNITY): Payer: Medicare HMO

## 2020-07-06 DIAGNOSIS — J440 Chronic obstructive pulmonary disease with acute lower respiratory infection: Secondary | ICD-10-CM | POA: Diagnosis present

## 2020-07-06 DIAGNOSIS — K2270 Barrett's esophagus without dysplasia: Secondary | ICD-10-CM | POA: Diagnosis present

## 2020-07-06 DIAGNOSIS — Z96641 Presence of right artificial hip joint: Secondary | ICD-10-CM | POA: Diagnosis present

## 2020-07-06 DIAGNOSIS — I10 Essential (primary) hypertension: Secondary | ICD-10-CM | POA: Diagnosis not present

## 2020-07-06 DIAGNOSIS — Z681 Body mass index (BMI) 19 or less, adult: Secondary | ICD-10-CM | POA: Diagnosis not present

## 2020-07-06 DIAGNOSIS — I739 Peripheral vascular disease, unspecified: Secondary | ICD-10-CM | POA: Diagnosis present

## 2020-07-06 DIAGNOSIS — E872 Acidosis, unspecified: Secondary | ICD-10-CM | POA: Diagnosis present

## 2020-07-06 DIAGNOSIS — R0602 Shortness of breath: Secondary | ICD-10-CM | POA: Diagnosis present

## 2020-07-06 DIAGNOSIS — Z79899 Other long term (current) drug therapy: Secondary | ICD-10-CM

## 2020-07-06 DIAGNOSIS — E785 Hyperlipidemia, unspecified: Secondary | ICD-10-CM | POA: Diagnosis present

## 2020-07-06 DIAGNOSIS — D631 Anemia in chronic kidney disease: Secondary | ICD-10-CM | POA: Diagnosis present

## 2020-07-06 DIAGNOSIS — R197 Diarrhea, unspecified: Secondary | ICD-10-CM | POA: Diagnosis not present

## 2020-07-06 DIAGNOSIS — E43 Unspecified severe protein-calorie malnutrition: Secondary | ICD-10-CM | POA: Diagnosis present

## 2020-07-06 DIAGNOSIS — F17210 Nicotine dependence, cigarettes, uncomplicated: Secondary | ICD-10-CM | POA: Diagnosis present

## 2020-07-06 DIAGNOSIS — I129 Hypertensive chronic kidney disease with stage 1 through stage 4 chronic kidney disease, or unspecified chronic kidney disease: Secondary | ICD-10-CM | POA: Diagnosis present

## 2020-07-06 DIAGNOSIS — R791 Abnormal coagulation profile: Secondary | ICD-10-CM | POA: Diagnosis present

## 2020-07-06 DIAGNOSIS — A419 Sepsis, unspecified organism: Secondary | ICD-10-CM | POA: Diagnosis not present

## 2020-07-06 DIAGNOSIS — Z9114 Patient's other noncompliance with medication regimen: Secondary | ICD-10-CM | POA: Diagnosis not present

## 2020-07-06 DIAGNOSIS — R109 Unspecified abdominal pain: Secondary | ICD-10-CM | POA: Diagnosis present

## 2020-07-06 DIAGNOSIS — M1990 Unspecified osteoarthritis, unspecified site: Secondary | ICD-10-CM | POA: Diagnosis present

## 2020-07-06 DIAGNOSIS — F0390 Unspecified dementia without behavioral disturbance: Secondary | ICD-10-CM | POA: Diagnosis present

## 2020-07-06 DIAGNOSIS — R64 Cachexia: Secondary | ICD-10-CM | POA: Diagnosis present

## 2020-07-06 DIAGNOSIS — E874 Mixed disorder of acid-base balance: Principal | ICD-10-CM | POA: Diagnosis present

## 2020-07-06 DIAGNOSIS — R6520 Severe sepsis without septic shock: Secondary | ICD-10-CM | POA: Diagnosis not present

## 2020-07-06 DIAGNOSIS — Z8616 Personal history of COVID-19: Secondary | ICD-10-CM

## 2020-07-06 DIAGNOSIS — Z809 Family history of malignant neoplasm, unspecified: Secondary | ICD-10-CM

## 2020-07-06 DIAGNOSIS — Z8249 Family history of ischemic heart disease and other diseases of the circulatory system: Secondary | ICD-10-CM | POA: Diagnosis not present

## 2020-07-06 DIAGNOSIS — G629 Polyneuropathy, unspecified: Secondary | ICD-10-CM | POA: Diagnosis present

## 2020-07-06 DIAGNOSIS — Z83438 Family history of other disorder of lipoprotein metabolism and other lipidemia: Secondary | ICD-10-CM

## 2020-07-06 DIAGNOSIS — I252 Old myocardial infarction: Secondary | ICD-10-CM | POA: Diagnosis not present

## 2020-07-06 DIAGNOSIS — J9601 Acute respiratory failure with hypoxia: Secondary | ICD-10-CM | POA: Diagnosis present

## 2020-07-06 DIAGNOSIS — D649 Anemia, unspecified: Secondary | ICD-10-CM | POA: Diagnosis present

## 2020-07-06 DIAGNOSIS — Z7902 Long term (current) use of antithrombotics/antiplatelets: Secondary | ICD-10-CM | POA: Diagnosis not present

## 2020-07-06 DIAGNOSIS — R1013 Epigastric pain: Secondary | ICD-10-CM | POA: Diagnosis not present

## 2020-07-06 DIAGNOSIS — N184 Chronic kidney disease, stage 4 (severe): Secondary | ICD-10-CM | POA: Diagnosis present

## 2020-07-06 DIAGNOSIS — Z7982 Long term (current) use of aspirin: Secondary | ICD-10-CM

## 2020-07-06 LAB — COMPREHENSIVE METABOLIC PANEL
ALT: 16 U/L (ref 0–44)
AST: 26 U/L (ref 15–41)
Albumin: 2.8 g/dL — ABNORMAL LOW (ref 3.5–5.0)
Alkaline Phosphatase: 101 U/L (ref 38–126)
Anion gap: 15 (ref 5–15)
BUN: 34 mg/dL — ABNORMAL HIGH (ref 8–23)
CO2: 9 mmol/L — ABNORMAL LOW (ref 22–32)
Calcium: 9.1 mg/dL (ref 8.9–10.3)
Chloride: 115 mmol/L — ABNORMAL HIGH (ref 98–111)
Creatinine, Ser: 2.11 mg/dL — ABNORMAL HIGH (ref 0.44–1.00)
GFR calc Af Amer: 26 mL/min — ABNORMAL LOW (ref >60)
GFR calc non Af Amer: 22 mL/min — ABNORMAL LOW (ref >60)
Glucose, Bld: 171 mg/dL — ABNORMAL HIGH (ref 70–99)
Potassium: 4.8 mmol/L (ref 3.5–5.1)
Sodium: 139 mmol/L (ref 135–145)
Total Bilirubin: 0.4 mg/dL (ref 0.3–1.2)
Total Protein: 7 g/dL (ref 6.5–8.1)

## 2020-07-06 LAB — I-STAT ARTERIAL BLOOD GAS, ED
Acid-base deficit: 12 mmol/L — ABNORMAL HIGH (ref 0.0–2.0)
Bicarbonate: 11.8 mmol/L — ABNORMAL LOW (ref 20.0–28.0)
Calcium, Ion: 1.33 mmol/L (ref 1.15–1.40)
HCT: 23 % — ABNORMAL LOW (ref 36.0–46.0)
Hemoglobin: 7.8 g/dL — ABNORMAL LOW (ref 12.0–15.0)
O2 Saturation: 96 %
Patient temperature: 97.9
Potassium: 4.3 mmol/L (ref 3.5–5.1)
Sodium: 143 mmol/L (ref 135–145)
TCO2: 12 mmol/L — ABNORMAL LOW (ref 22–32)
pCO2 arterial: 19.1 mmHg — CL (ref 32.0–48.0)
pH, Arterial: 7.397 (ref 7.350–7.450)
pO2, Arterial: 75 mmHg — ABNORMAL LOW (ref 83.0–108.0)

## 2020-07-06 LAB — BRAIN NATRIURETIC PEPTIDE: B Natriuretic Peptide: 67 pg/mL (ref 0.0–100.0)

## 2020-07-06 LAB — CBC WITH DIFFERENTIAL/PLATELET
Abs Immature Granulocytes: 0.1 10*3/uL — ABNORMAL HIGH (ref 0.00–0.07)
Basophils Absolute: 0.1 10*3/uL (ref 0.0–0.1)
Basophils Relative: 2 %
Eosinophils Absolute: 0.2 10*3/uL (ref 0.0–0.5)
Eosinophils Relative: 3 %
HCT: 29.5 % — ABNORMAL LOW (ref 36.0–46.0)
Hemoglobin: 9.1 g/dL — ABNORMAL LOW (ref 12.0–15.0)
Immature Granulocytes: 1 %
Lymphocytes Relative: 22 %
Lymphs Abs: 1.8 10*3/uL (ref 0.7–4.0)
MCH: 27.6 pg (ref 26.0–34.0)
MCHC: 30.8 g/dL (ref 30.0–36.0)
MCV: 89.4 fL (ref 80.0–100.0)
Monocytes Absolute: 1.1 10*3/uL — ABNORMAL HIGH (ref 0.1–1.0)
Monocytes Relative: 13 %
Neutro Abs: 4.9 10*3/uL (ref 1.7–7.7)
Neutrophils Relative %: 59 %
Platelets: 750 10*3/uL — ABNORMAL HIGH (ref 150–400)
RBC: 3.3 MIL/uL — ABNORMAL LOW (ref 3.87–5.11)
RDW: 17.3 % — ABNORMAL HIGH (ref 11.5–15.5)
WBC: 8.2 10*3/uL (ref 4.0–10.5)
nRBC: 0 % (ref 0.0–0.2)

## 2020-07-06 LAB — URINALYSIS, ROUTINE W REFLEX MICROSCOPIC
Bacteria, UA: NONE SEEN
Bilirubin Urine: NEGATIVE
Glucose, UA: NEGATIVE mg/dL
Hgb urine dipstick: NEGATIVE
Ketones, ur: NEGATIVE mg/dL
Nitrite: NEGATIVE
Protein, ur: 100 mg/dL — AB
Specific Gravity, Urine: 1.012 (ref 1.005–1.030)
pH: 5 (ref 5.0–8.0)

## 2020-07-06 LAB — D-DIMER, QUANTITATIVE: D-Dimer, Quant: 3.42 ug/mL-FEU — ABNORMAL HIGH (ref 0.00–0.50)

## 2020-07-06 LAB — PROTIME-INR
INR: 2.2 — ABNORMAL HIGH (ref 0.8–1.2)
INR: 1.2 (ref 0.8–1.2)
Prothrombin Time: 23.9 seconds — ABNORMAL HIGH (ref 11.4–15.2)
Prothrombin Time: 14.5 seconds (ref 11.4–15.2)

## 2020-07-06 LAB — TROPONIN I (HIGH SENSITIVITY)
Troponin I (High Sensitivity): 19 ng/L — ABNORMAL HIGH (ref <18)
Troponin I (High Sensitivity): 20 ng/L — ABNORMAL HIGH (ref <18)

## 2020-07-06 LAB — APTT: aPTT: 48 seconds — ABNORMAL HIGH (ref 24–36)

## 2020-07-06 LAB — LACTIC ACID, PLASMA
Lactic Acid, Venous: 3.2 mmol/L (ref 0.5–1.9)
Lactic Acid, Venous: 2 mmol/L (ref 0.5–1.9)

## 2020-07-06 MED ORDER — ROSUVASTATIN CALCIUM 20 MG PO TABS
40.0000 mg | ORAL_TABLET | Freq: Every day | ORAL | Status: DC
  Administered 2020-07-06 – 2020-07-10 (×5): 40 mg via ORAL
  Filled 2020-07-06: qty 2
  Filled 2020-07-06: qty 8
  Filled 2020-07-06 (×4): qty 2

## 2020-07-06 MED ORDER — ALBUTEROL SULFATE HFA 108 (90 BASE) MCG/ACT IN AERS
2.0000 | INHALATION_SPRAY | Freq: Four times a day (QID) | RESPIRATORY_TRACT | Status: DC | PRN
  Filled 2020-07-06: qty 6.7

## 2020-07-06 MED ORDER — ONDANSETRON HCL 4 MG PO TABS: 4.0000 mg | ORAL_TABLET | Freq: Four times a day (QID) | ORAL | Status: DC | PRN

## 2020-07-06 MED ORDER — CARVEDILOL 3.125 MG PO TABS: 12.5000 mg | ORAL_TABLET | Freq: Two times a day (BID) | ORAL | Status: DC

## 2020-07-06 MED ORDER — GABAPENTIN 100 MG PO CAPS
100.0000 mg | ORAL_CAPSULE | Freq: Three times a day (TID) | ORAL | Status: DC
  Administered 2020-07-06 – 2020-07-10 (×11): 100 mg via ORAL
  Filled 2020-07-06 (×11): qty 1

## 2020-07-06 MED ORDER — AMLODIPINE BESYLATE 5 MG PO TABS: 7.5000 mg | ORAL_TABLET | Freq: Every day | ORAL | Status: DC

## 2020-07-06 MED ORDER — HEPARIN SODIUM (PORCINE) 5000 UNIT/ML IJ SOLN
5000.0000 [IU] | Freq: Three times a day (TID) | INTRAMUSCULAR | Status: DC
  Administered 2020-07-06 – 2020-07-10 (×11): 5000 [IU] via SUBCUTANEOUS
  Filled 2020-07-06 (×11): qty 1

## 2020-07-06 MED ORDER — SODIUM CHLORIDE 0.9 % IV SOLN
2.0000 g | INTRAVENOUS | Status: DC
  Administered 2020-07-07: 2 g via INTRAVENOUS
  Filled 2020-07-06 (×2): qty 20

## 2020-07-06 MED ORDER — SODIUM CHLORIDE 0.9 % IV SOLN
2.0000 g | INTRAVENOUS | Status: DC
  Administered 2020-07-06: 2 g via INTRAVENOUS
  Filled 2020-07-06: qty 20

## 2020-07-06 MED ORDER — LACTATED RINGERS IV BOLUS (SEPSIS)
1000.0000 mL | Freq: Once | INTRAVENOUS | Status: AC
  Administered 2020-07-06: 1000 mL via INTRAVENOUS

## 2020-07-06 MED ORDER — STERILE WATER FOR INJECTION IV SOLN
INTRAVENOUS | Status: DC
  Filled 2020-07-06: qty 850
  Filled 2020-07-06 (×3): qty 150

## 2020-07-06 MED ORDER — ACETAMINOPHEN 650 MG RE SUPP: 650.0000 mg | Freq: Four times a day (QID) | RECTAL | Status: DC | PRN

## 2020-07-06 MED ORDER — METRONIDAZOLE IN NACL 5-0.79 MG/ML-% IV SOLN
500.0000 mg | Freq: Three times a day (TID) | INTRAVENOUS | Status: DC
  Administered 2020-07-06 – 2020-07-08 (×5): 500 mg via INTRAVENOUS
  Filled 2020-07-06 (×5): qty 100

## 2020-07-06 MED ORDER — CLOPIDOGREL BISULFATE 75 MG PO TABS
75.0000 mg | ORAL_TABLET | Freq: Every day | ORAL | Status: DC
  Administered 2020-07-07 – 2020-07-10 (×4): 75 mg via ORAL
  Filled 2020-07-06 (×4): qty 1

## 2020-07-06 MED ORDER — LACTATED RINGERS IV BOLUS (SEPSIS)
500.0000 mL | Freq: Once | INTRAVENOUS | Status: AC
  Administered 2020-07-06: 500 mL via INTRAVENOUS

## 2020-07-06 MED ORDER — ACETAMINOPHEN 325 MG PO TABS
650.0000 mg | ORAL_TABLET | Freq: Four times a day (QID) | ORAL | Status: DC | PRN
  Administered 2020-07-06 – 2020-07-09 (×2): 650 mg via ORAL
  Filled 2020-07-06 (×2): qty 2

## 2020-07-06 MED ORDER — SODIUM CHLORIDE 0.9 % IV SOLN
500.0000 mg | INTRAVENOUS | Status: DC
  Administered 2020-07-06: 500 mg via INTRAVENOUS
  Filled 2020-07-06: qty 500

## 2020-07-06 MED ORDER — ONDANSETRON HCL 4 MG/2ML IJ SOLN: 4.0000 mg | Freq: Four times a day (QID) | INTRAMUSCULAR | Status: DC | PRN

## 2020-07-06 NOTE — ED Triage Notes (Signed)
Pt from with reported [facility] . Pt had positive COVID 2 months ago and has had SHOB,Cough, SHOB and weakness for 2 weeks. EMS gave Pt 1000 mg Tylenol  For temp 100.8. Pt does not use nasal O2 at but EMS started pt on 2 lt. Nasal.

## 2020-07-06 NOTE — ED Provider Notes (Signed)
  Face-to-face evaluation   History: She presents for evaluation of fever and chills for several days.  She denies cough or weakness.  Physical exam: Alert elderly female.  Mild tachypnea.  No anterior wheezing or rales.  Heart is tachycardic.  Abdomen soft and nontender.  Medical screening examination/treatment/procedure(s) were conducted as a shared visit with non-physician practitioner(s) and myself.  I personally evaluated the patient during the encounter    Daleen Bo, MD 07/15/20 1610

## 2020-07-06 NOTE — ED Notes (Signed)
Attempted to start an additional IV and obtain blood. Attempts unsuccessful. Was able to obtain enough blood through venipuncture to obtain labs and barely enough for 0.5 set of blood cultures.

## 2020-07-06 NOTE — H&P (Signed)
History and Physical    Alyssa [name] UGQ:[id number] DOB: 1941-09-26 DOA: 07/06/2020  PCP: [name], DO  Patient coming from: Home  I have personally briefly reviewed patient's old medical records in [place]  Chief Complaint: SOB  HPI: Alyssa [name] is a 79 y.o. female with medical history significant of CKD 4, HTN, PAD.  Pt admitted last month with COVID-19 and AKF.  Pt not on home O2 after discharge.  Since discharge pt reports progressively worsening SOB.  She is not on any blood thinners other than plavix (specifically denies coumadin, xarelto, eliquis).  Today had SOB, fever, chills, rigors, EMS called, tylenol given for Tm 100.8 with EMS.   ED Course: Satting 100% on RA, tachy to 120 (107 after 1.5L bolus), Tm 99 in ED (100.8 with EMS), RR 25.  BP 160/102.  Initial lactate 3.2, 2.0 on repeat.  Bicarb 9 AG 15.  Creat 2.11 (2.2 on DC last month so probably her new baseline).  Given empiric rocephin / azithro for suspected PNA.  CXR however neg for acute findings or PNA.  UA: large LE, neg nitrites, neg blood, 6-10 WBC.  Pt reports no N/V/D, does have upper abd pain for the past 1 week or so.   Review of Systems: As per HPI, otherwise all review of systems negative.  Past Medical History:  Diagnosis Date  . Acute respiratory failure with hypoxia ([place]) 11/01/2019  . Anxiety   . Arthritis   . Chest pain   . Chronic kidney disease   . Dyspnea   . Elevated troponin 12/13/2018  . Headache(784.0)   . Hyperlipidemia   . Hypertension   . Joint pain   . Leg pain   . Peripheral neuropathy   . Peripheral vascular disease [facility])     Past Surgical History:  Procedure Laterality Date  . ABDOMINAL ANGIOGRAM N/A 10/29/2011   Procedure: ABDOMINAL ANGIOGRAM;  Surgeon: Elam Dutch, MD;  Location: [facility] CATH LAB;  Service: Cardiovascular;  Laterality: N/A;  . BACK SURGERY    . FEMORAL-FEMORAL BYPASS GRAFT  08/31/2010  . FLEXIBLE SIGMOIDOSCOPY  N/A 06/12/2019   Procedure: FLEXIBLE SIGMOIDOSCOPY;  Surgeon: Ladene Artist, MD;  Location: [facility] ENDOSCOPY;  Service: Endoscopy;  Laterality: N/A;  Patient had little bit to eat this morning so this will be unsedated.  Marland Kitchen JOINT REPLACEMENT Right 2012   Hip  . LOWER EXTREMITY ANGIOGRAPHY  06/14/2017   Procedure: Lower Extremity Angiography;  Surgeon: Adrian Prows, MD;  Location: [place] CV LAB;  Service: Cardiovascular;;  Bilateral limited angio performed  . RENAL ANGIOGRAPHY N/A 06/14/2017   Procedure: Renal Angiography;  Surgeon: Adrian Prows, MD;  Location: [place] CV LAB;  Service: Cardiovascular;  Laterality: N/A;  . TOTAL HIP ARTHROPLASTY     right     reports that she has been smoking cigarettes. She has a 27.00 pack-year smoking history. She has never used smokeless tobacco. She reports that she does not drink alcohol and does not use drugs.  No Known Allergies  Family History  Problem Relation Age of Onset  . Heart attack Mother   . Heart disease Mother   . Hyperlipidemia Mother   . Hypertension Mother   . Heart attack Father   . Heart disease Father        Before age 79  . Hyperlipidemia Father   . Hypertension Father   . Cancer Sister        brain tumor  . Aneurysm Brother  brain  . Colon cancer Neg Hx   . Liver cancer Neg Hx      Prior to Admission medications   Medication Sig Start Date End Date Taking? Authorizing Provider  acetaminophen (TYLENOL) 325 MG tablet Take 2 tablets (650 mg total) by mouth every 6 (six) hours as needed for mild pain, moderate pain or fever (or Fever >/= 101). 06/16/19   Hongalgi, Lenis Dickinson, MD  albuterol (PROAIR HFA) 108 (90 Base) MCG/ACT inhaler Inhale 2 puffs into the lungs every 6 (six) hours as needed for wheezing or shortness of breath.    [provider]  amLODipine (NORVASC) 5 MG tablet Take 1.5 tablets (7.5 mg total) by mouth daily. 04/14/20   Troy Sine, MD  aspirin 81 MG EC tablet Take 1 tablet (81 mg total)  by mouth daily. 01/09/19   Caren Griffins, MD  busPIRone (BUSPAR) 15 MG tablet Take 15 mg by mouth 3 (three) times daily as needed for anxiety. 02/28/19   [provider]  carvedilol (COREG) 12.5 MG tablet Take 1 tablet (12.5 mg total) by mouth 2 (two) times daily with a meal. 02/10/20   Little Ishikawa, MD  cloNIDine (CATAPRES) 0.2 MG tablet Take 0.2 mg by mouth at bedtime. 05/25/20   [provider]  clopidogrel (PLAVIX) 75 MG tablet Take 1 tablet (75 mg total) by mouth daily. 01/09/19   Caren Griffins, MD  diclofenac Sodium (VOLTAREN) 1 % GEL Apply 2 g topically 4 (four) times daily. 01/15/20   [provider]  dicyclomine (BENTYL) 20 MG tablet Take 20 mg by mouth 3 (three) times daily. 04/01/20   [provider]  donepezil (ARICEPT) 5 MG tablet Take 5 mg by mouth daily.    [provider]  DULoxetine (CYMBALTA) 30 MG capsule Take 30 mg by mouth daily.    [provider]  feeding supplement, ENSURE ENLIVE, (ENSURE ENLIVE) LIQD Take 237 mLs by mouth 2 (two) times daily between meals. 06/16/19   Hongalgi, Lenis Dickinson, MD  Ferrous Sulfate (IRON HIGH-POTENCY) 325 MG TABS Take 325 mg by mouth daily with breakfast. 06/30/19   Hongalgi, Lenis Dickinson, MD  gabapentin (NEURONTIN) 100 MG capsule Take 100 mg by mouth 3 (three) times daily. 10/25/19   [provider]  hydrOXYzine (ATARAX/VISTARIL) 25 MG tablet Take 25 mg by mouth daily. 05/12/20   [provider]  megestrol (MEGACE) 40 MG tablet Take 40 mg by mouth 2 (two) times daily. 05/12/20   [provider]  methocarbamol (ROBAXIN) 500 MG tablet Take 500 mg by mouth 3 (three) times daily. 05/12/20   [provider]  Multiple Vitamin (MULTIVITAMIN WITH MINERALS) TABS tablet Take 1 tablet by mouth daily. 06/17/19   Hongalgi, Lenis Dickinson, MD  nitroGLYCERIN (NITROSTAT) 0.4 MG SL tablet Place 1 tablet (0.4 mg total) under the tongue every 5 (five) minutes as needed for chest pain. 01/07/20    Allie Bossier, MD  Nutritional Supplements (,FEEDING SUPPLEMENT, PROSOURCE PLUS) liquid Take 30 mLs by mouth 2 (two) times daily between meals. 06/07/20 07/07/20  Ghimire, Henreitta Leber, MD  ondansetron (ZOFRAN ODT) 4 MG disintegrating tablet Take 1 tablet (4 mg total) by mouth every 4 (four) hours as needed for nausea or vomiting. 06/09/20   Charlesetta Shanks, MD  pantoprazole (PROTONIX) 40 MG tablet Take 1 tablet (40 mg total) by mouth daily. 06/16/19   Hongalgi, Lenis Dickinson, MD  POLY-IRON 150 FORTE 150-25-1 MG-MCG-MG CAPS Take 1 capsule by mouth daily. 04/02/20  [provider]  rosuvastatin (CRESTOR) 40 MG tablet Take 1 tablet (40 mg total) by mouth at bedtime. Patient taking differently: Take 40 mg by mouth daily.  06/16/19   Hongalgi, Lenis Dickinson, MD  sodium bicarbonate 650 MG tablet Take 2 tablets (1,300 mg total) by mouth 2 (two) times daily. 01/07/20   Allie Bossier, MD  valproic acid (DEPAKENE) 250 MG capsule Take 250 mg by mouth at bedtime. 05/12/20   [provider]    Physical Exam: Vitals:   07/06/20 1854 07/06/20 1900 07/06/20 1945 07/06/20 2000  BP:  (!) 159/102 (!) 166/100 (!) 160/102  Pulse:  (!) 108 (!) 109 (!) 107  Resp:  (!) 24 (!) 25 (!) 25  Temp: 97.9 F (36.6 C)     TempSrc: Oral     SpO2:  100% 99% 96%    Constitutional: Pt with rigors, states she feels cold despite blankets Eyes: PERRL, lids and conjunctivae normal ENMT: Mucous membranes are moist. Posterior pharynx clear of any exudate or lesions.Normal dentition.  Neck: normal, supple, no masses, no thyromegaly Respiratory: clear to auscultation bilaterally Cardiovascular: Regular rate and rhythm, no murmurs / rubs / gallops. No extremity edema. 2+ pedal pulses. No carotid bruits.  Abdomen: TTP over epigastric abdomen with guarding and rebound. Musculoskeletal: no clubbing / cyanosis. No joint deformity upper and lower extremities. Good ROM, no contractures. Normal muscle tone.  Skin: no rashes, lesions,  ulcers. No induration Neurologic: CN 2-12 grossly intact. Sensation intact, DTR normal. Strength 5/5 in all 4.  Psychiatric: Normal judgment and insight. Alert and oriented x 3. Normal mood.    Labs on Admission: I have personally reviewed following labs and imaging studies  CBC: Recent Labs  Lab 07/06/20 1538  WBC 8.2  NEUTROABS 4.9  HGB 9.1*  HCT 29.5*  MCV 89.4  PLT 353*   Basic Metabolic Panel: Recent Labs  Lab 07/06/20 1538  NA 139  K 4.8  CL 115*  CO2 9*  GLUCOSE 171*  BUN 34*  CREATININE 2.11*  CALCIUM 9.1   GFR: CrCl cannot be calculated (Unknown ideal weight.). Liver Function Tests: Recent Labs  Lab 07/06/20 1538  AST 26  ALT 16  ALKPHOS 101  BILITOT 0.4  PROT 7.0  ALBUMIN 2.8*   No results for input(s): LIPASE, AMYLASE in the last 168 hours. No results for input(s): AMMONIA in the last 168 hours. Coagulation Profile: Recent Labs  Lab 07/06/20 1538  INR 2.2*   Cardiac Enzymes: No results for input(s): CKTOTAL, CKMB, CKMBINDEX, TROPONINI in the last 168 hours. BNP (last 3 results) No results for input(s): PROBNP in the last 8760 hours. HbA1C: No results for input(s): HGBA1C in the last 72 hours. CBG: No results for input(s): GLUCAP in the last 168 hours. Lipid Profile: No results for input(s): CHOL, HDL, LDLCALC, TRIG, CHOLHDL, LDLDIRECT in the last 72 hours. Thyroid Function Tests: No results for input(s): TSH, T4TOTAL, FREET4, T3FREE, THYROIDAB in the last 72 hours. Anemia Panel: No results for input(s): VITAMINB12, FOLATE, FERRITIN, TIBC, IRON, RETICCTPCT in the last 72 hours. Urine analysis:    Component Value Date/Time   COLORURINE YELLOW 07/06/2020 1856   APPEARANCEUR HAZY (A) 07/06/2020 1856   LABSPEC 1.012 07/06/2020 1856   PHURINE 5.0 07/06/2020 1856   GLUCOSEU NEGATIVE 07/06/2020 1856   HGBUR NEGATIVE 07/06/2020 1856   BILIRUBINUR NEGATIVE 07/06/2020 1856   KETONESUR NEGATIVE 07/06/2020 1856   PROTEINUR 100 (A) 07/06/2020  1856   UROBILINOGEN 0.2 08/24/2015 1737  NITRITE NEGATIVE 07/06/2020 1856   LEUKOCYTESUR LARGE (A) 07/06/2020 1856    Radiological Exams on Admission: DG Chest Port 1 View  Result Date: 07/06/2020 CLINICAL DATA:  Sepsis. Patient had positive COVID-19 test 2 months ago. Shortness of breath and cough. EXAM: PORTABLE CHEST 1 VIEW COMPARISON:  June 09, 2020 FINDINGS: The heart, hila, and mediastinum are normal. No pneumothorax. No nodules or masses. No focal infiltrates. IMPRESSION: No active disease. Electronically Signed   By: Dorise Bullion III M.D   On: 07/06/2020 16:18    EKG: Independently reviewed.  Assessment/Plan Principal Problem:   Shortness of breath Active Problems:   PVD (peripheral vascular disease) (HCC)   CKD (chronic kidney disease) stage 4, GFR 15-29 ml/min (HCC)   Anemia   Metabolic acidosis   Essential hypertension   Abdominal pain   Elevated INR    1. SOB - 1. DDx includes SOB due to acidosis (especially the NAG component), PE, "long haul COVID" symptoms, PNA 2. PNA not seen on CXR 3. Regarding PE: 1. Getting D.Dimer 2. INR today is 2.2 despite not being on any meds that should cause this! 3. Checking repeat INR to verify this, if real then PE does become much less likely (though I still dont know WHY she has an INR of 2.2). 4. Cant CTA chest due to creat 5. Checking trop 6. Checking D.Dimer 7. Decide on VQ scan based on above work up 8. If INR 2.2, then definitely DONT want to just empirically start full dose anticoagulation without firm PE diagnosis. 4. Will see if anything in lung bases with the CT abd/ pelvis 2. ? Of sepsis - 1. ? Intra abd 1. Check CT abd/pelvis without contrast 2. CXR neg for PNA and satting 100% on RA 3. Pt got rocephin / azithro in ED 4. Adding flagyl and holding azithro for the moment due to ? Of intra abd infection 5. CT abd/pelvis w/o contrast ordered 6. Will leave on rocephin / flagyl for now 7. BCx ordered 8. UCx  ordered 9. Procalcitonin ordered 10. Got 1.5L bolus in ED 11. Tele monitor 3. Metabolic acidosis - 1. Minor lactic acidosis component on top of an RTA 2. Pt not sure if taking bicarb pills or not ("I take so many meds, I dont know").  Bicarb of 9 today is significantly down from 22 on discharge. 3. Isotonic bicarb at 100cc/hr 4. Repeat BMP in AM 4. Anemia - 1. Chronic, likely due to recent illnesses as well as CKD 2. No stigmata of GIB 5. HTN - 1. Med rec pending 2. Plan on cont home BP meds for the moment in AM 6. CKD stage 4 - 1. Chronic, probably at baseline today  DVT prophylaxis: Heparin [place] Code Status: Full Family Communication: No family in room Disposition Plan: Home after pt improved Consults called: None Admission status: Admit to inpatient  Severity of Illness: The appropriate patient status for this patient is INPATIENT. Inpatient status is judged to be reasonable and necessary in order to provide the required intensity of service to ensure the patient's safety. The patient's presenting symptoms, physical exam findings, and initial radiographic and laboratory data in the context of their chronic comorbidities is felt to place them at high risk for further clinical deterioration. Furthermore, it is not anticipated that the patient will be medically stable for discharge from the hospital within 2 midnights of admission. The following factors support the patient status of inpatient.   IP status due to acute illness with  numerous abnormalities on lab work that need further working up and diagnosis of underlying cause.  * I certify that at the point of admission it is my clinical judgment that the patient will require inpatient hospital care spanning beyond 2 midnights from the point of admission due to high intensity of service, high risk for further deterioration and high frequency of surveillance required.*    Gina Costilla M. DO [facility]  How to contact the  [facility] Attending or Consulting provider [place] or covering provider during after hours [place], for this patient?  1. Check the care team in [facility] and look for a) attending/consulting [facility] provider listed and b) the [facility] team listed 2. Log into [email or web address]  Amion Physician Scheduling and messaging for groups and whole hospitals  On call and physician scheduling software for group practices, residents, hospitalists and other medical providers for call, clinic, rotation and shift schedules. On[employer] is a hospital-wide system for scheduling doctors and paging doctors on call. EasyPlot is for scientific plotting and data analysis.  [email or web address]  and use [employer]'s universal password to access. If you do not have the password, please contact the hospital operator.  3. Locate the [facility] provider you are looking for under [facility] and page to a number that you can be directly reached. 4. If you still have difficulty reaching the provider, please page the [facility] (Director on Call) for the Hospitalists listed on amion for assistance.  07/06/2020, 9:38 PM

## 2020-07-06 NOTE — ED Provider Notes (Signed)
[facility] EMERGENCY DEPARTMENT Provider Note   CSN: [id number] Arrival date & time: 07/06/20  1442     History Chief Complaint  Patient presents with  . Shortness of Breath    Alyssa [name] is a 79 y.o. female.  The history is provided by the patient.  Shortness of Breath Severity:  Moderate Onset quality:  Gradual Duration:  2 weeks Timing:  Intermittent Progression:  Worsening Chronicity:  New Relieved by:  Nothing Worsened by:  Nothing Ineffective treatments:  None tried Associated symptoms: fever    Pt complains of increased shortness of breath.  Pt was diagnosed with Covid and hospitalized on 8/16.  Pt reports she has had gradual increase in shortness of breath.  Pt reports she had a fever today. EMS placed pt on 02 due to low oxygen level.  Pt given 5 puffs of albuterol.     Past Medical History:  Diagnosis Date  . Acute respiratory failure with hypoxia ([place]) 11/01/2019  . Anxiety   . Arthritis   . Chest pain   . Chronic kidney disease   . Dyspnea   . Elevated troponin 12/13/2018  . Headache(784.0)   . Hyperlipidemia   . Hypertension   . Joint pain   . Leg pain   . Peripheral neuropathy   . Peripheral vascular disease [facility])     Patient Active Problem List   Diagnosis Date Noted  . Acute respiratory failure due to COVID-19 ([place]) 06/02/2020  . Acute renal failure superimposed on chronic kidney disease ([place]) 02/06/2020  . Leukocytosis 02/06/2020  . Thrombocytosis ([place]) 02/06/2020  . Palliative care encounter   . Constipation 01/05/2020  . Malnutrition of moderate degree ([place]) 01/04/2020  . Acute on chronic renal failure ([place]) 01/04/2020  . Hyperkalemia 01/03/2020  . AKI (acute kidney injury) ([place]) 01/03/2020  . Acute respiratory failure with hypoxia ([place]) 11/01/2019  . UTI (urinary tract infection) 11/01/2019  . CAP (community acquired pneumonia) 11/01/2019  . Acute cystitis without hematuria   . Encephalopathy 09/18/2019   . SAH (subarachnoid hemorrhage) ([place]) 09/18/2019  . Dementia ([place]) 09/18/2019  . Metabolic acidosis 12/45/8099  . ARF (acute renal failure) ([place]) 09/17/2019  . Acute encephalopathy 09/17/2019  . Abnormal CT scan, colon   . Diarrhea   . Diverticulitis 06/07/2019  . Depression with anxiety 06/07/2019  . DNR (do not resuscitate) 03/23/2019  . Volume overload 03/23/2019  . Edema 03/23/2019  . Acute renal failure (ARF) ([place]) 03/08/2019  . Hypokalemia 03/08/2019  . Palliative care by specialist   . Goals of care, counseling/discussion   . Altered mental status   . Encephalopathy, toxic 12/24/2018  . Aspiration pneumonia of both lower lobes due to gastric secretions ([place]) 12/24/2018  . Non-ST elevation (NSTEMI) myocardial infarction ([place])   . Chest pain   . Dyspnea on exertion   . Elevated troponin level 12/09/2018  . Chronic migraine 03/27/2018  . Lumbar radiculopathy 03/27/2018  . Gait abnormality 03/27/2018  . CKD (chronic kidney disease) stage 4, GFR 15-29 ml/min ([facility]) 12/28/2016  . Symptomatic anemia 12/28/2016  . Chronic pain syndrome 12/28/2016  . Benzodiazepine withdrawal without complication ([place]) 83/38/2505  . Chronic right shoulder pain 12/06/2016  . Fall 09/17/2016  . Fracture of humeral head, closed, right, initial encounter 09/17/2016  . Rib fractures 09/17/2016  . Multiple falls 09/17/2016  . Severe muscle deconditioning 09/17/2016  . Failure to thrive in adult 09/17/2016  . Melena 04/25/2016  . Transient hypotension 04/25/2016  . Anxiety state 04/25/2016  .  Tobacco abuse 04/25/2016  . Hyperlipidemia 04/25/2016  . Syncope 04/24/2016  . Near syncope 04/24/2016  . Gait difficulty 01/12/2016  . Foot drop, left 01/12/2016  . Severe recurrent major depression without psychotic features ([place]) 08/28/2015  . Spondylolisthesis at L4-L5 level 07/30/2015  . PVD (peripheral vascular disease) ([place]) 07/29/2014  . Weakness-Bilateral arm/leg 07/29/2014  . Numbness-left leg  07/29/2014  . Swelling of limb-Legs 07/29/2014  . Atherosclerosis of native arteries of the extremities with intermittent claudication 09/30/2011    Past Surgical History:  Procedure Laterality Date  . ABDOMINAL ANGIOGRAM N/A 10/29/2011   Procedure: ABDOMINAL ANGIOGRAM;  Surgeon: Elam Dutch, MD;  Location: [facility] CATH LAB;  Service: Cardiovascular;  Laterality: N/A;  . BACK SURGERY    . FEMORAL-FEMORAL BYPASS GRAFT  08/31/2010  . FLEXIBLE SIGMOIDOSCOPY N/A 06/12/2019   Procedure: FLEXIBLE SIGMOIDOSCOPY;  Surgeon: Ladene Artist, MD;  Location: [facility] ENDOSCOPY;  Service: Endoscopy;  Laterality: N/A;  Patient had little bit to eat this morning so this will be unsedated.  Marland Kitchen JOINT REPLACEMENT Right 2012   Hip  . LOWER EXTREMITY ANGIOGRAPHY  06/14/2017   Procedure: Lower Extremity Angiography;  Surgeon: Adrian Prows, MD;  Location: [place] CV LAB;  Service: Cardiovascular;;  Bilateral limited angio performed  . RENAL ANGIOGRAPHY N/A 06/14/2017   Procedure: Renal Angiography;  Surgeon: Adrian Prows, MD;  Location: [place] CV LAB;  Service: Cardiovascular;  Laterality: N/A;  . TOTAL HIP ARTHROPLASTY     right     OB History   No obstetric history on file.     Family History  Problem Relation Age of Onset  . Heart attack Mother   . Heart disease Mother   . Hyperlipidemia Mother   . Hypertension Mother   . Heart attack Father   . Heart disease Father        Before age 72  . Hyperlipidemia Father   . Hypertension Father   . Cancer Sister        brain tumor  . Aneurysm Brother        brain  . Colon cancer Neg Hx   . Liver cancer Neg Hx     Social History   Tobacco Use  . Smoking status: Current Every Day Smoker    Packs/day: 1.00    Years: 27.00    Pack years: 27.00    Types: Cigarettes    Last attempt to quit: 07/23/2019    Years since quitting: 0.9  . Smokeless tobacco: Never Used  Vaping Use  . Vaping Use: Never used  Substance Use Topics  . Alcohol use: No  .  Drug use: No    Home Medications Prior to Admission medications   Medication Sig Start Date End Date Taking? Authorizing Provider  acetaminophen (TYLENOL) 325 MG tablet Take 2 tablets (650 mg total) by mouth every 6 (six) hours as needed for mild pain, moderate pain or fever (or Fever >/= 101). 06/16/19   Hongalgi, Lenis Dickinson, MD  albuterol (PROAIR HFA) 108 (90 Base) MCG/ACT inhaler Inhale 2 puffs into the lungs every 6 (six) hours as needed for wheezing or shortness of breath.    [provider]  amLODipine (NORVASC) 5 MG tablet Take 1.5 tablets (7.5 mg total) by mouth daily. 04/14/20   Troy Sine, MD  aspirin 81 MG EC tablet Take 1 tablet (81 mg total) by mouth daily. 01/09/19   Caren Griffins, MD  busPIRone (BUSPAR) 15 MG tablet Take 15 mg by mouth  3 (three) times daily as needed for anxiety. 02/28/19   [provider]  carvedilol (COREG) 12.5 MG tablet Take 1 tablet (12.5 mg total) by mouth 2 (two) times daily with a meal. 02/10/20   Little Ishikawa, MD  cloNIDine (CATAPRES) 0.2 MG tablet Take 0.2 mg by mouth at bedtime. 05/25/20   [provider]  clopidogrel (PLAVIX) 75 MG tablet Take 1 tablet (75 mg total) by mouth daily. 01/09/19   Caren Griffins, MD  diclofenac Sodium (VOLTAREN) 1 % GEL Apply 2 g topically 4 (four) times daily. 01/15/20   [provider]  dicyclomine (BENTYL) 20 MG tablet Take 20 mg by mouth 3 (three) times daily. 04/01/20   [provider]  donepezil (ARICEPT) 5 MG tablet Take 5 mg by mouth daily.    [provider]  DULoxetine (CYMBALTA) 30 MG capsule Take 30 mg by mouth daily.    [provider]  feeding supplement, ENSURE ENLIVE, (ENSURE ENLIVE) LIQD Take 237 mLs by mouth 2 (two) times daily between meals. 06/16/19   Hongalgi, Lenis Dickinson, MD  Ferrous Sulfate (IRON HIGH-POTENCY) 325 MG TABS Take 325 mg by mouth daily with breakfast. 06/30/19   Hongalgi, Lenis Dickinson, MD  gabapentin (NEURONTIN) 100 MG capsule Take  100 mg by mouth 3 (three) times daily. 10/25/19   [provider]  hydrOXYzine (ATARAX/VISTARIL) 25 MG tablet Take 25 mg by mouth daily. 05/12/20   [provider]  megestrol (MEGACE) 40 MG tablet Take 40 mg by mouth 2 (two) times daily. 05/12/20   [provider]  methocarbamol (ROBAXIN) 500 MG tablet Take 500 mg by mouth 3 (three) times daily. 05/12/20   [provider]  Multiple Vitamin (MULTIVITAMIN WITH MINERALS) TABS tablet Take 1 tablet by mouth daily. 06/17/19   Hongalgi, Lenis Dickinson, MD  nitroGLYCERIN (NITROSTAT) 0.4 MG SL tablet Place 1 tablet (0.4 mg total) under the tongue every 5 (five) minutes as needed for chest pain. 01/07/20   Allie Bossier, MD  Nutritional Supplements (,FEEDING SUPPLEMENT, PROSOURCE PLUS) liquid Take 30 mLs by mouth 2 (two) times daily between meals. 06/07/20 07/07/20  Ghimire, Henreitta Leber, MD  ondansetron (ZOFRAN ODT) 4 MG disintegrating tablet Take 1 tablet (4 mg total) by mouth every 4 (four) hours as needed for nausea or vomiting. 06/09/20   Charlesetta Shanks, MD  pantoprazole (PROTONIX) 40 MG tablet Take 1 tablet (40 mg total) by mouth daily. 06/16/19   Hongalgi, Lenis Dickinson, MD  POLY-IRON 150 FORTE 150-25-1 MG-MCG-MG CAPS Take 1 capsule by mouth daily. 04/02/20   [provider]  predniSONE (DELTASONE) 10 MG tablet Take  30 mg daily for 1 day, 20 mg daily for 1 days,10 mg daily for 1 day, then stop 06/07/20   Jonetta Osgood, MD  rosuvastatin (CRESTOR) 40 MG tablet Take 1 tablet (40 mg total) by mouth at bedtime. Patient taking differently: Take 40 mg by mouth daily.  06/16/19   Hongalgi, Lenis Dickinson, MD  sodium bicarbonate 650 MG tablet Take 2 tablets (1,300 mg total) by mouth 2 (two) times daily. 01/07/20   Allie Bossier, MD  valproic acid (DEPAKENE) 250 MG capsule Take 250 mg by mouth at bedtime. 05/12/20   [provider]    Allergies    Patient has no known allergies.  Review of Systems   Review of Systems   Constitutional: Positive for fever.  Respiratory: Positive for shortness of breath.   All other systems reviewed and are negative.  Physical Exam Updated Vital Signs BP (!) 165/102   Pulse (!) 107   Temp 97.9 F (36.6 C) (Oral)   Resp 17   SpO2 98%   Physical Exam Vitals and nursing note reviewed.  Constitutional:      Appearance: She is well-developed.  HENT:     Head: Normocephalic.  Eyes:     Pupils: Pupils are equal, round, and reactive to light.  Cardiovascular:     Rate and Rhythm: Normal rate and regular rhythm.  Pulmonary:     Effort: Pulmonary effort is normal.     Breath sounds: No decreased breath sounds.  Abdominal:     General: There is no distension.     Palpations: Abdomen is soft.  Musculoskeletal:        General: Normal range of motion.     Cervical back: Normal range of motion.  Skin:    General: Skin is warm.  Neurological:     General: No focal deficit present.     Mental Status: She is alert and oriented to person, place, and time.  Psychiatric:        Mood and Affect: Mood normal.     ED Results / Procedures / Treatments   Labs (all labs ordered are listed, but only abnormal results are displayed) Labs Reviewed  COMPREHENSIVE METABOLIC PANEL - Abnormal; Notable for the following components:      Result Value   Chloride 115 (*)    CO2 9 (*)    Glucose, Bld 171 (*)    BUN 34 (*)    Creatinine, Ser 2.11 (*)    Albumin 2.8 (*)    GFR calc non Af Amer 22 (*)    GFR calc Af Amer 26 (*)    All other components within normal limits  LACTIC ACID, PLASMA - Abnormal; Notable for the following components:   Lactic Acid, Venous 3.2 (*)    All other components within normal limits  LACTIC ACID, PLASMA - Abnormal; Notable for the following components:   Lactic Acid, Venous 2.0 (*)    All other components within normal limits  PROTIME-INR - Abnormal; Notable for the following components:   Prothrombin Time 23.9 (*)    INR 2.2 (*)    All  other components within normal limits  APTT - Abnormal; Notable for the following components:   aPTT 48 (*)    All other components within normal limits  CBC WITH DIFFERENTIAL/PLATELET - Abnormal; Notable for the following components:   RBC 3.30 (*)    Hemoglobin 9.1 (*)    HCT 29.5 (*)    RDW 17.3 (*)    Platelets 750 (*)    Monocytes Absolute 1.1 (*)    Abs Immature Granulocytes 0.10 (*)    All other components within normal limits  URINALYSIS, ROUTINE W REFLEX MICROSCOPIC - Abnormal; Notable for the following components:   APPearance HAZY (*)    Protein, ur 100 (*)    Leukocytes,Ua LARGE (*)    All other components within normal limits  CULTURE, BLOOD (ROUTINE X 2)  CULTURE, BLOOD (ROUTINE X 2)  CBC WITH DIFFERENTIAL/PLATELET    EKG EKG Interpretation  Date/Time:  Sunday July 06 2020 14:49:42 EDT Ventricular Rate:  118 PR Interval:    QRS Duration: 89 QT Interval:  201 QTC Calculation: 284 R Axis:   14 Text Interpretation: Sinus or ectopic atrial tachycardia Aberrant conduction of SV complex(es) Repol abnrm suggests ischemia, diffuse leads Borderline ST elevation, anterior leads Baseline  wander Since last tracing Non-specific ST-t changes and rate faster Otherwise no significant change Confirmed by Daleen Bo [phone]) on 07/06/2020 2:54:24 PM   Radiology DG Chest Port 1 View  Result Date: 07/06/2020 CLINICAL DATA:  Sepsis. Patient had positive COVID-19 test 2 months ago. Shortness of breath and cough. EXAM: PORTABLE CHEST 1 VIEW COMPARISON:  June 09, 2020 FINDINGS: The heart, hila, and mediastinum are normal. No pneumothorax. No nodules or masses. No focal infiltrates. IMPRESSION: No active disease. Electronically Signed   By: Dorise Bullion III M.D   On: 07/06/2020 16:18    Procedures Procedures (including critical care time)  Medications Ordered in ED Medications  cefTRIAXone (ROCEPHIN) 2 g in sodium chloride 0.9 % 100 mL IVPB ( Intravenous Stopped  07/06/20 1626)  azithromycin (ZITHROMAX) 500 mg in sodium chloride 0.9 % 250 mL IVPB (500 mg Intravenous New Bag/Given 07/06/20 1652)  lactated ringers bolus 1,000 mL ( Intravenous Restarted 07/06/20 1741)    And  lactated ringers bolus 500 mL (0 mLs Intravenous Stopped 07/06/20 1651)    ED Course  I have reviewed the triage vital signs and the nursing notes.  Pertinent labs & imaging results that were available during my care of the patient were reviewed by me and considered in my medical decision making (see chart for details).  Clinical Course as of Jul 06 2001  Sun Jul 06, 2020  2001 Lactic acid, plasma(!!) [LS]    Clinical Course User Index [LS] Fransico Meadow, [place]   MDM Rules/Calculators/A&P                          Mdm:   Sepsis orders.  Pt given IV fluids 1500 cc lr.  Pt given rocephin.  I suspect infection is pulmonary.  Ua shows 6-10 wbc's  Lactic acid is 3.2.  Repeat lactic acid is 2.0  Hospitalist consulted Dr. Alcario Drought will admit  Final Clinical Impression(s) / ED Diagnoses Final diagnoses:  Sepsis [facility])    Rx / [place] Orders ED Discharge Orders    None       Sidney Ace 07/06/20 2115    Daleen Bo, MD 07/15/20 1610

## 2020-07-06 NOTE — ED Notes (Signed)
IV team at bedside 

## 2020-07-06 NOTE — ED Notes (Signed)
Date and time results received: 07/06/20 1639  Test: lactic acid Critical Value: 3.2  Name of Provider Notified: Eulis Foster MD

## 2020-07-07 ENCOUNTER — Inpatient Hospital Stay (HOSPITAL_COMMUNITY): Payer: Medicare HMO

## 2020-07-07 DIAGNOSIS — R6520 Severe sepsis without septic shock: Secondary | ICD-10-CM

## 2020-07-07 DIAGNOSIS — A419 Sepsis, unspecified organism: Secondary | ICD-10-CM

## 2020-07-07 LAB — URINE CULTURE

## 2020-07-07 LAB — BASIC METABOLIC PANEL
Anion gap: 14 (ref 5–15)
BUN: 22 mg/dL (ref 8–23)
CO2: 21 mmol/L — ABNORMAL LOW (ref 22–32)
Calcium: 8.5 mg/dL — ABNORMAL LOW (ref 8.9–10.3)
Chloride: 103 mmol/L (ref 98–111)
Creatinine, Ser: 1.75 mg/dL — ABNORMAL HIGH (ref 0.44–1.00)
GFR calc Af Amer: 32 mL/min — ABNORMAL LOW (ref >60)
GFR calc non Af Amer: 28 mL/min — ABNORMAL LOW (ref >60)
Glucose, Bld: 91 mg/dL (ref 70–99)
Potassium: 4.8 mmol/L (ref 3.5–5.1)
Sodium: 138 mmol/L (ref 135–145)

## 2020-07-07 LAB — SALICYLATE LEVEL: Salicylate Lvl: <7 mg/dL — ABNORMAL LOW (ref 7.0–30.0)

## 2020-07-07 LAB — PROCALCITONIN: Procalcitonin: 0.2 ng/mL

## 2020-07-07 MED ORDER — VALPROIC ACID 250 MG PO CAPS
250.0000 mg | ORAL_CAPSULE | Freq: Every day | ORAL | Status: DC
  Administered 2020-07-07 – 2020-07-09 (×3): 250 mg via ORAL
  Filled 2020-07-07 (×5): qty 1

## 2020-07-07 MED ORDER — CITALOPRAM HYDROBROMIDE 20 MG PO TABS
20.0000 mg | ORAL_TABLET | Freq: Every day | ORAL | Status: DC
  Administered 2020-07-07 – 2020-07-10 (×4): 20 mg via ORAL
  Filled 2020-07-07: qty 1
  Filled 2020-07-07: qty 2
  Filled 2020-07-07 (×2): qty 1

## 2020-07-07 MED ORDER — HYDROXYZINE HCL 25 MG PO TABS
25.0000 mg | ORAL_TABLET | Freq: Every day | ORAL | Status: DC
  Administered 2020-07-07 – 2020-07-10 (×4): 25 mg via ORAL
  Filled 2020-07-07 (×4): qty 1

## 2020-07-07 MED ORDER — METHOCARBAMOL 500 MG PO TABS
500.0000 mg | ORAL_TABLET | Freq: Three times a day (TID) | ORAL | Status: DC
  Administered 2020-07-07 – 2020-07-10 (×10): 500 mg via ORAL
  Filled 2020-07-07 (×10): qty 1

## 2020-07-07 MED ORDER — BUSPIRONE HCL 15 MG PO TABS: 15.0000 mg | ORAL_TABLET | Freq: Three times a day (TID) | ORAL | Status: DC | PRN

## 2020-07-07 MED ORDER — AMLODIPINE BESYLATE 2.5 MG PO TABS
2.5000 mg | ORAL_TABLET | Freq: Every day | ORAL | Status: DC
  Administered 2020-07-07 – 2020-07-09 (×3): 2.5 mg via ORAL
  Filled 2020-07-07 (×3): qty 1

## 2020-07-07 MED ORDER — ZOLPIDEM TARTRATE 5 MG PO TABS
5.0000 mg | ORAL_TABLET | Freq: Every evening | ORAL | Status: DC | PRN
  Administered 2020-07-07 – 2020-07-09 (×4): 5 mg via ORAL
  Filled 2020-07-07 (×4): qty 1

## 2020-07-07 MED ORDER — PANTOPRAZOLE SODIUM 40 MG PO TBEC
40.0000 mg | DELAYED_RELEASE_TABLET | Freq: Every day | ORAL | Status: DC
  Administered 2020-07-07 – 2020-07-10 (×4): 40 mg via ORAL
  Filled 2020-07-07 (×4): qty 1

## 2020-07-07 MED ORDER — DONEPEZIL HCL 10 MG PO TABS
5.0000 mg | ORAL_TABLET | Freq: Every day | ORAL | Status: DC
  Administered 2020-07-07 – 2020-07-10 (×4): 5 mg via ORAL
  Filled 2020-07-07 (×4): qty 1

## 2020-07-07 MED ORDER — SODIUM CHLORIDE 0.9 % IV SOLN
INTRAVENOUS | Status: DC | PRN
  Administered 2020-07-07: 250 mL via INTRAVENOUS

## 2020-07-07 MED ORDER — TECHNETIUM TO 99M ALBUMIN AGGREGATED
4.1000 | Freq: Once | INTRAVENOUS | Status: AC | PRN
  Administered 2020-07-07: 4.1 via INTRAVENOUS

## 2020-07-07 MED ORDER — DULOXETINE HCL 30 MG PO CPEP
30.0000 mg | ORAL_CAPSULE | Freq: Every day | ORAL | Status: DC
  Administered 2020-07-07 – 2020-07-10 (×4): 30 mg via ORAL
  Filled 2020-07-07 (×4): qty 1

## 2020-07-07 MED ORDER — CLONIDINE HCL 0.1 MG PO TABS
0.1000 mg | ORAL_TABLET | Freq: Two times a day (BID) | ORAL | Status: DC
  Administered 2020-07-07 – 2020-07-10 (×7): 0.1 mg via ORAL
  Filled 2020-07-07 (×7): qty 1

## 2020-07-07 NOTE — Consult Note (Signed)
Reason for Consult: Anion Gap metabolic acidosis Referring Physician: Darrick Meigs, MD  Alyssa [name] is an 79 y.o. 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  In the ED she was noted to have fever, rigors, tachycardia at 120, O2 sat of 100% on room air, tachypnea, and Tmax of 100.8.  Labs were notable for a lactate of 3.2, bicarb 9, AG 15, Scr 2.11. She was admitted for suspected PNA and we were consulted to further evaluate her profound metabolic acidosis.  Her SCr is at baseline and she denies any N/V/D, productive cough, orthopnea, or PND.  She did admit to taking some NSAIDs at home and one aspirin a day.  She denies any other NSAIDs or salicylates.  She did complain of upper abdominal pain for the week prior to admission.  CXR negative for pneumonia or infiltrates.  UA +leukocytes, neg nitrites, neg blood, 6-10 WBC.  Trend in Creatinine: Creatinine, Ser  Date/Time Value Ref Range Status  07/06/2020 03:38 PM 2.11 (H) 0.44 - 1.00 mg/dL Final  06/09/2020 04:04 PM 2.23 (H) 0.44 - 1.00 mg/dL Final  06/07/2020 06:51 AM 3.04 (H) 0.44 - 1.00 mg/dL Final  06/06/2020 06:28 AM 3.43 (H) 0.44 - 1.00 mg/dL Final  06/05/2020 03:37 AM 4.07 (H) 0.44 - 1.00 mg/dL Final  06/04/2020 02:51 AM 4.74 (H) 0.44 - 1.00 mg/dL Final  06/03/2020 02:17 PM 5.42 (H) 0.44 - 1.00 mg/dL Final  06/03/2020 12:35 PM 5.32 (H) 0.44 - 1.00 mg/dL Final  06/03/2020 04:13 AM 6.27 (H) 0.44 - 1.00 mg/dL Final  06/02/2020 07:54 PM 6.99 (H) 0.44 - 1.00 mg/dL Final  06/02/2020 03:12 PM 7.43 (H) 0.44 -  1.00 mg/dL Final  02/10/2020 03:32 AM 2.11 (H) 0.44 - 1.00 mg/dL Final  02/09/2020 02:40 AM 2.19 (H) 0.44 - 1.00 mg/dL Final  02/08/2020 08:37 AM 2.72 (H) 0.44 - 1.00 mg/dL Final  02/07/2020 04:21 AM 3.46 (H) 0.44 - 1.00 mg/dL Final  02/06/2020 12:25 PM 3.76 (H) 0.44 - 1.00 mg/dL Final  01/08/2020 03:18 AM 1.83 (H) 0.44 - 1.00 mg/dL Final  01/07/2020 04:21 AM 1.72 (H) 0.44 - 1.00 mg/dL Final  01/06/2020 03:16 AM 1.69 (H) 0.44 - 1.00 mg/dL Final  01/05/2020 02:38 AM 1.86 (H) 0.44 - 1.00 mg/dL Final  01/04/2020 04:41 PM 1.86 (H) 0.44 - 1.00 mg/dL Final  01/04/2020 02:33 AM 2.11 (H) 0.44 - 1.00 mg/dL Final  01/03/2020 06:02 AM 2.37 (H) 0.44 - 1.00 mg/dL Final  01/02/2020 11:28 PM 2.45 (H) 0.44 - 1.00 mg/dL Final  01/02/2020 12:46 PM 2.82 (H) 0.44 - 1.00 mg/dL Final  11/04/2019 04:24 AM 1.61 (H) 0.44 - 1.00 mg/dL Final  11/02/2019 06:18 AM 1.94 (H) 0.44 - 1.00 mg/dL Final  11/01/2019 08:19 AM 2.20 (H) 0.44 - 1.00 mg/dL Final  11/01/2019 03:52 AM 2.72 (H) 0.44 - 1.00 mg/dL Final  10/31/2019 04:47 PM 2.56 (H) 0.44 - 1.00 mg/dL Final  09/22/2019 02:34 AM 2.80 (H) 0.44 - 1.00 mg/dL Final  09/21/2019 06:06 AM 3.28 (H) 0.44 - 1.00 mg/dL Final  09/20/2019 03:36 AM 3.22 (H) 0.44 - 1.00 mg/dL Final  09/19/2019 06:32 AM 3.49 (H) 0.44 -  1.00 mg/dL Final  09/18/2019 12:51 PM 4.16 (H) 0.44 - 1.00 mg/dL Final  09/17/2019 10:23 PM 4.86 (H) 0.44 - 1.00 mg/dL Final  09/17/2019 02:08 PM 5.07 (H) 0.44 - 1.00 mg/dL Final  06/22/2019 03:18 PM 1.82 (H) 0.44 - 1.00 mg/dL Final  06/16/2019 04:11 AM 1.70 (H) 0.44 - 1.00 mg/dL Final  06/15/2019 05:04 AM 1.59 (H) 0.44 - 1.00 mg/dL Final  06/14/2019 04:49 AM 1.57 (H) 0.44 - 1.00 mg/dL Final  06/13/2019 04:03 AM 1.79 (H) 0.44 - 1.00 mg/dL Final  06/12/2019 01:58 AM 1.72 (H) 0.44 - 1.00 mg/dL Final  06/11/2019 03:34 AM 1.63 (H) 0.44 - 1.00 mg/dL Final  06/10/2019 04:41 AM 1.71 (H) 0.44 - 1.00 mg/dL Final  06/09/2019 04:48 AM 2.10 (H) 0.44 - 1.00 mg/dL Final   06/08/2019 02:54 AM 2.22 (H) 0.44 - 1.00 mg/dL Final  06/06/2019 09:06 PM 2.12 (H) 0.44 - 1.00 mg/dL Final  04/11/2019 11:25 AM 2.07 (H) 0.44 - 1.00 mg/dL Final  03/23/2019 06:31 AM 1.59 (H) 0.44 - 1.00 mg/dL Final  03/22/2019 06:52 PM 1.73 (H) 0.44 - 1.00 mg/dL Final  03/11/2019 06:36 PM 2.47 (H) 0.44 - 1.00 mg/dL Final    PMH:   Past Medical History:  Diagnosis Date  . Acute respiratory failure with hypoxia ([place]) 11/01/2019  . Anxiety   . Arthritis   . Chest pain   . Chronic kidney disease   . Dyspnea   . Elevated troponin 12/13/2018  . Headache(784.0)   . Hyperlipidemia   . Hypertension   . Joint pain   . Leg pain   . Peripheral neuropathy   . Peripheral vascular disease ([facility])     PSH:   Past Surgical History:  Procedure Laterality Date  . ABDOMINAL ANGIOGRAM N/A 10/29/2011   Procedure: ABDOMINAL ANGIOGRAM;  Surgeon: Elam Dutch, MD;  Location: [facility] CATH LAB;  Service: Cardiovascular;  Laterality: N/A;  . BACK SURGERY    . FEMORAL-FEMORAL BYPASS GRAFT  08/31/2010  . FLEXIBLE SIGMOIDOSCOPY N/A 06/12/2019   Procedure: FLEXIBLE SIGMOIDOSCOPY;  Surgeon: Ladene Artist, MD;  Location: [facility] ENDOSCOPY;  Service: Endoscopy;  Laterality: N/A;  Patient had little bit to eat this morning so this will be unsedated.  Marland Kitchen JOINT REPLACEMENT Right 2012   Hip  . LOWER EXTREMITY ANGIOGRAPHY  06/14/2017   Procedure: Lower Extremity Angiography;  Surgeon: Adrian Prows, MD;  Location: [place] CV LAB;  Service: Cardiovascular;;  Bilateral limited angio performed  . RENAL ANGIOGRAPHY N/A 06/14/2017   Procedure: Renal Angiography;  Surgeon: Adrian Prows, MD;  Location: [place] CV LAB;  Service: Cardiovascular;  Laterality: N/A;  . TOTAL HIP ARTHROPLASTY     right    Allergies: No Known Allergies  Medications:   Prior to Admission medications   Medication Sig Start Date End Date Taking? Authorizing Provider  acetaminophen (TYLENOL) 325 MG tablet Take 2 tablets (650 mg total) by mouth  every 6 (six) hours as needed for mild pain, moderate pain or fever (or Fever >/= 101). 06/16/19  Yes Hongalgi, Lenis Dickinson, MD  albuterol (PROAIR HFA) 108 (90 Base) MCG/ACT inhaler Inhale 2 puffs into the lungs every 4 (four) hours as needed for wheezing or shortness of breath.    Yes [provider]  amLODipine (NORVASC) 5 MG tablet Take 1.5 tablets (7.5 mg total) by mouth daily. 04/14/20  Yes Troy Sine, MD  aspirin 81 MG EC tablet Take 1 tablet (81 mg total) by mouth daily. 01/09/19  Yes Gherghe, Costin  M, MD  busPIRone (BUSPAR) 30 MG tablet Take 45 mg by mouth daily. 07/02/20  Yes [provider]  carvedilol (COREG) 12.5 MG tablet Take 1 tablet (12.5 mg total) by mouth 2 (two) times daily with a meal. 02/10/20  Yes Little Ishikawa, MD  cloNIDine (CATAPRES) 0.2 MG tablet Take 0.2 mg by mouth at bedtime. 05/25/20  Yes [provider]  clopidogrel (PLAVIX) 75 MG tablet Take 1 tablet (75 mg total) by mouth daily. 01/09/19  Yes Caren Griffins, MD  diclofenac Sodium (VOLTAREN) 1 % GEL Apply 2 g topically 4 (four) times daily as needed (pain).  01/15/20  Yes [provider]  dicyclomine (BENTYL) 20 MG tablet Take 20 mg by mouth 3 (three) times daily. 04/01/20  Yes [provider]  donepezil (ARICEPT) 5 MG tablet Take 5 mg by mouth daily.   Yes [provider]  DULoxetine (CYMBALTA) 30 MG capsule Take 30 mg by mouth daily.   Yes [provider]  feeding supplement, ENSURE ENLIVE, (ENSURE ENLIVE) LIQD Take 237 mLs by mouth 2 (two) times daily between meals. 06/16/19  Yes Hongalgi, Lenis Dickinson, MD  gabapentin (NEURONTIN) 100 MG capsule Take 100 mg by mouth 3 (three) times daily. 10/25/19  Yes [provider]  hydrOXYzine (ATARAX/VISTARIL) 25 MG tablet Take 25 mg by mouth at bedtime as needed (sleep).  05/12/20  Yes [provider]  magnesium oxide (MAG-OX) 400 MG tablet Take 400 mg by mouth daily.   Yes [provider]   megestrol (MEGACE) 40 MG tablet Take 40 mg by mouth 2 (two) times daily. 05/12/20  Yes [provider]  methocarbamol (ROBAXIN) 500 MG tablet Take 500 mg by mouth 3 (three) times daily as needed (back spasms).  05/12/20  Yes [provider]  nitroGLYCERIN (NITROSTAT) 0.4 MG SL tablet Place 1 tablet (0.4 mg total) under the tongue every 5 (five) minutes as needed for chest pain. 01/07/20  Yes Allie Bossier, MD  ondansetron (ZOFRAN ODT) 4 MG disintegrating tablet Take 1 tablet (4 mg total) by mouth every 4 (four) hours as needed for nausea or vomiting. 06/09/20  Yes Charlesetta Shanks, MD  pantoprazole (PROTONIX) 40 MG tablet Take 1 tablet (40 mg total) by mouth daily. 06/16/19  Yes Hongalgi, Lenis Dickinson, MD  POLY-IRON 150 FORTE 150-25-1 MG-MCG-MG CAPS Take 1 capsule by mouth daily. 04/02/20  Yes [provider]  rosuvastatin (CRESTOR) 40 MG tablet Take 1 tablet (40 mg total) by mouth at bedtime. Patient taking differently: Take 40 mg by mouth daily.  06/16/19  Yes Hongalgi, Lenis Dickinson, MD  sodium bicarbonate 650 MG tablet Take 2 tablets (1,300 mg total) by mouth 2 (two) times daily. 01/07/20  Yes Allie Bossier, MD  valproic acid (DEPAKENE) 250 MG capsule Take 250 mg by mouth at bedtime. 05/12/20  Yes [provider]  Vitamin D, Ergocalciferol, (DRISDOL) 1.25 MG (50000 UNIT) CAPS capsule Take 50,000 Units by mouth every 7 (seven) days. mondays   Yes [provider]  Ferrous Sulfate (IRON HIGH-POTENCY) 325 MG TABS Take 325 mg by mouth daily with breakfast. Patient not taking: Reported on 07/07/2020 06/30/19   Modena Jansky, MD  Multiple Vitamin (MULTIVITAMIN WITH MINERALS) TABS tablet Take 1 tablet by mouth daily. Patient not taking: Reported on 07/07/2020 06/17/19   Modena Jansky, MD  Nutritional Supplements (,FEEDING SUPPLEMENT, PROSOURCE PLUS) liquid Take 30 mLs by mouth 2 (two) times daily between meals. Patient not taking: Reported on 07/07/2020 06/07/20  07/07/20   Jonetta Osgood, MD    Inpatient medications: . amLODipine  2.5 mg Oral Daily  . citalopram  20 mg Oral Daily  . cloNIDine  0.1 mg Oral BID  . clopidogrel  75 mg Oral Daily  . donepezil  5 mg Oral Daily  . DULoxetine  30 mg Oral Daily  . gabapentin  100 mg Oral TID  . heparin  5,000 Units Subcutaneous Q8H  . hydrOXYzine  25 mg Oral Daily  . methocarbamol  500 mg Oral TID  . pantoprazole  40 mg Oral Daily  . rosuvastatin  40 mg Oral Daily  . valproic acid  250 mg Oral QHS    Discontinued Meds:   Medications Discontinued During This Encounter  Medication Reason  . predniSONE (DELTASONE) 10 MG tablet   . cefTRIAXone (ROCEPHIN) 2 g in sodium chloride 0.9 % 100 mL IVPB   . azithromycin (ZITHROMAX) 500 mg in sodium chloride 0.9 % 250 mL IVPB   . carvedilol (COREG) tablet 12.5 mg   . amLODipine (NORVASC) tablet 7.5 mg   . amLODipine (NORVASC) 2.5 MG tablet   . busPIRone (BUSPAR) 15 MG tablet   . cloNIDine (CATAPRES) 0.1 MG tablet   . citalopram (CELEXA) 20 MG tablet     Social History:  reports that she has been smoking cigarettes. She has a 27.00 pack-year smoking history. She has never used smokeless tobacco. She reports that she does not drink alcohol and does not use drugs.  Family History:   Family History  Problem Relation Age of Onset  . Heart attack Mother   . Heart disease Mother   . Hyperlipidemia Mother   . Hypertension Mother   . Heart attack Father   . Heart disease Father        Before age 24  . Hyperlipidemia Father   . Hypertension Father   . Cancer Sister        brain tumor  . Aneurysm Brother        brain  . Colon cancer Neg Hx   . Liver cancer Neg Hx     Pertinent items are noted in HPI. Weight change:   Intake/Output Summary (Last 24 hours) at 07/07/2020 1959 Last data filed at 07/07/2020 0036 Gross per 24 hour  Intake 367.92 ml  Output --  Net 367.92 ml   BP (!) 192/166   Pulse (!) 107   Temp 97.9 F (36.6 C) (Oral)   Resp 18   SpO2  94%  Vitals:   07/07/20 0800 07/07/20 1000 07/07/20 1900 07/07/20 1915  BP: (!) 179/112 (!) 167/109 (!) 192/166   Pulse: 96 (!) 109 (!) 116 (!) 107  Resp: (!) 23 18 (!) 22 18  Temp:      TempSrc:      SpO2: 100% 95% 93% 94%     General appearance: cachectic and no distress Head: Normocephalic, without obvious abnormality, atraumatic Resp: clear to auscultation bilaterally Cardio: regular rate and rhythm and no rub GI: soft, non-tender; bowel sounds normal; no masses,  no organomegaly Extremities: extremities normal, atraumatic, no cyanosis or edema  Labs: Basic Metabolic Panel: Recent Labs  Lab 07/06/20 1538 07/06/20 2147  NA 139 143  K 4.8 4.3  CL 115*  --   CO2 9*  --   GLUCOSE 171*  --   BUN 34*  --   CREATININE 2.11*  --   ALBUMIN 2.8*  --   CALCIUM 9.1  --    Liver  Function Tests: Recent Labs  Lab 07/06/20 1538  AST 26  ALT 16  ALKPHOS 101  BILITOT 0.4  PROT 7.0  ALBUMIN 2.8*   No results for input(s): LIPASE, AMYLASE in the last 168 hours. No results for input(s): AMMONIA in the last 168 hours. CBC: Recent Labs  Lab 07/06/20 1538 07/06/20 2147  WBC 8.2  --   NEUTROABS 4.9  --   HGB 9.1* 7.8*  HCT 29.5* 23.0*  MCV 89.4  --   PLT 750*  --    PT/INR: @LABRCNTIP (inr:5) Cardiac Enzymes: )No results for input(s): CKTOTAL, CKMB, CKMBINDEX, TROPONINI in the last 168 hours. CBG: No results for input(s): GLUCAP in the last 168 hours.  Iron Studies: No results for input(s): IRON, TIBC, TRANSFERRIN, FERRITIN in the last 168 hours.  Xrays/Other Studies: CT ABDOMEN PELVIS WO CONTRAST  Result Date: 07/06/2020 CLINICAL DATA:  COVID-19 positivity, abdominal pain with rebound tenderness EXAM: CT ABDOMEN AND PELVIS WITHOUT CONTRAST TECHNIQUE: Multidetector CT imaging of the abdomen and pelvis was performed following the standard protocol without IV contrast. COMPARISON:  06/02/2020 FINDINGS: Lower chest: Very mild patchy opacities are noted consistent with the  given clinical history. No sizable effusion is seen. Hiatal hernia is noted and stable. Hepatobiliary: No focal liver abnormality is seen. No gallstones, gallbladder wall thickening, or biliary dilatation. Pancreas: Unremarkable. No pancreatic ductal dilatation or surrounding inflammatory changes. Spleen: Normal in size without focal abnormality. Adrenals/Urinary Tract: Adrenal glands are within normal limits. Renal vascular calcifications are noted bilaterally. The right kidney is somewhat shrunken compared to the left but stable in appearance from the prior study. No obstructive changes are seen. The bladder is well distended. Stomach/Bowel: Scattered diverticular change of the colon is noted without evidence of diverticulitis. The appendix is within normal limits. Small bowel and stomach are within normal limits aside from the previously described hiatal hernia. Vascular/Lymphatic: Diffuse atherosclerotic changes are noted. No significant lymphadenopathy is seen. 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 The overall appearance is stable from the prior exam. Reproductive: Uterus and bilateral adnexa are unremarkable. Other: No abdominal wall hernia or abnormality. No abdominopelvic ascites. Musculoskeletal: Postsurgical changes are noted in the right hip as well as the lumbar spine stable in appearance from the prior exam. No acute fracture is seen. IMPRESSION: Chronic changes stable in appearance from the prior CT examination. No acute abnormality to correspond with the patient's physical exam is identified. Electronically Signed   By: Inez Catalina M.D.   On: 07/06/2020 22:44   DG Chest Port 1 View  Result Date: 07/06/2020 CLINICAL DATA:  Sepsis. Patient had positive COVID-19 test 2 months ago. Shortness of breath and cough. EXAM: PORTABLE CHEST 1 VIEW COMPARISON:  June 09, 2020 FINDINGS: The heart, hila, and mediastinum are  normal. No pneumothorax. No nodules or masses. No focal infiltrates. IMPRESSION: No active disease. Electronically Signed   By: Dorise Bullion III M.D   On: 07/06/2020 16:18     Assessment/Plan: 1. Mixed respiratory alkalosis with Metabolic acidosis - likely the source of her DOE and tachypnea given negative CXR, although V/Q scan pending.  She has underlying COPD as well as CKD stage IV.  AG is 15 and change in bicarb is about the same so 1:1 delta/delta but AG is 18 when corrected for albumin but pH is normal.  Agree with starting isotonic bicarb and follow.  Will order salicylates and UDS.  Scr is at baseline so  worsening renal function not likely sole source.  She is supposed to be on sodium bicarb tablets but don't think she has been taking them.  No diarrhea to explain profound acidosis.  Given her history of rigors and fever, need to r/o infectious source.  Cultures pending. 2. 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  Scr at baseline. 3. DOE- workup as above.  She does have h/o tobacco abuse and COPD.  Awaiting v/q scan 4. HTN- bp elevated now.  Restarted on home meds per primary 5. 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  Follow iron and will likely need to start ESA.   Governor [place]ook[place] Quinton Voth 07/07/2020, 7:59 PM

## 2020-07-07 NOTE — ED Notes (Signed)
Patient cleansed of loose, dark brown bowel movement. Brief, Chux and linens changed. Patient in NAD, respirations even and nonlabored, vitals stable. Call bell in reach. Hospital bed in low position. Patient updated on plan of care, verbalized understanding. Denies further needs.

## 2020-07-07 NOTE — Progress Notes (Addendum)
[facility]  PROGRESS NOTE  Alyssa [name] TDV:[id number] DOB: May 24, 1941 DOA: 07/06/2020 PCP: [name], DO   Brief HPI:   79 year old female history of CKD stage IV, hypertension, PAD who was admitted with COVID-19 and acute kidney injury last month.  Is currently not on home oxygen.  Since discharge patient reports progressively worsening shortness of breath.  Today she had shortness of breath, fever chills and rigors EMS was called.  Temperature was 100.8. Chest x-ray was negative for pneumonia. Patient started on Rocephin and Zithromax for pneumonia   Subjective   Patient seen and examined, continues to have shivering.  Patient says that she has history of tremors.  Procalcitonin 0.20.  D-dimer elevated 3.42.  VQ scan has been ordered.  INR is 1.2.  Lactic acid 2.0.  SARS COVID-19 RT PCR is positive.  O2 sat 100% on room air   Assessment/Plan:     1. Dyspnea-unclear etiology, chest x-ray shows no infiltrate. D-dimer elevated at 3.43. VQ scan ordered to rule out PE. BNP is 67.0, troponin XIX, 20. Lactic acid 2.0. She was treated for COVID-19 pneumonia last month. 2. Sepsis-patient presented with tachypnea, tachycardia, lactic acid 2.0 with suspected source of infection in lungs. She was started on Rocephin and Zithromax. Chest x-ray was clear, CT abdomen/pelvis unremarkable. UA shows large leukocytes, negative nitrite. Follow urine and blood culture results. 3. Metabolic acidosis/CKD stage IV-patient's bicarb is only 9.  Creatinine is 2.11, at baseline.  She is followed by nephrology as outpatient.  Patient was prescribed sodium bicarb tablets 1300 mg twice a day.  Not sure whether she was compliant with medications.  Patient has been started on sodium bicarb infusion in the hospital.  Will consult nephrology.  Follow BMP in am. 4. Hypertension-blood pressure is now elevated, will restart clonidine 0.1 mg p.o. twice daily, amlodipine 2.5 mg daily     COVID-19  Labs  Recent Labs    07/06/20 2246  DDIMER 3.42*    Lab Results  Component Value Date   SARSCOV2NAA Detected (A) 06/20/2020   SARSCOV2NAA POSITIVE (A) 06/02/2020   [place] NEGATIVE 01/06/2020   [place] NEGATIVE 01/02/2020     Scheduled medications:   . clopidogrel  75 mg Oral Daily  . gabapentin  100 mg Oral TID  . heparin  5,000 Units Subcutaneous Q8H  . rosuvastatin  40 mg Oral Daily         SpO2: 100 %    CBC: Recent Labs  Lab 07/06/20 1538 07/06/20 2147  WBC 8.2  --   NEUTROABS 4.9  --   HGB 9.1* 7.8*  HCT 29.5* 23.0*  MCV 89.4  --   PLT 750*  --     Basic Metabolic Panel: Recent Labs  Lab 07/06/20 1538 07/06/20 2147  NA 139 143  K 4.8 4.3  CL 115*  --   CO2 9*  --   GLUCOSE 171*  --   BUN 34*  --   CREATININE 2.11*  --   CALCIUM 9.1  --      Liver Function Tests: Recent Labs  Lab 07/06/20 1538  AST 26  ALT 16  ALKPHOS 101  BILITOT 0.4  PROT 7.0  ALBUMIN 2.8*     Antibiotics: Anti-infectives (From admission, onward)   Start     Dose/Rate Route Frequency Ordered Stop   07/07/20 1500  cefTRIAXone (ROCEPHIN) 2 g in sodium chloride 0.9 % 100 mL IVPB        2 g 200 mL/hr over  30 Minutes Intravenous Every 24 hours 07/06/20 2134     07/06/20 2145  metroNIDAZOLE (FLAGYL) IVPB 500 mg        500 mg 100 mL/hr over 60 Minutes Intravenous Every 8 hours 07/06/20 2134     07/06/20 1515  cefTRIAXone (ROCEPHIN) 2 g in sodium chloride 0.9 % 100 mL IVPB  Status:  Discontinued        2 g 200 mL/hr over 30 Minutes Intravenous Every 24 hours 07/06/20 1503 07/06/20 2130   07/06/20 1515  azithromycin (ZITHROMAX) 500 mg in sodium chloride 0.9 % 250 mL IVPB  Status:  Discontinued        500 mg 250 mL/hr over 60 Minutes Intravenous Every 24 hours 07/06/20 1503 07/06/20 2130       DVT prophylaxis: Heparin  Code Status: Full code  Family Communication: No family at bedside    Status is: Inpatient  Dispo: The patient is from: Home               Anticipated d/c is to: Home              Anticipated d/c date is: 07/09/2020              Patient currently not medically stable for discharge  Barrier to discharge-acute hypoxemic respiratory failure     Consultants:    Procedures:     Objective   Vitals:   07/07/20 0200 07/07/20 0300 07/07/20 0700 07/07/20 0800  BP: (!) 148/92 (!) 180/102 (!) 174/119 (!) 179/112  Pulse: 99 (!) 108 (!) 42 96  Resp: (!) 22 (!) 27 (!) 27 (!) 23  Temp:      TempSrc:      SpO2: 98% 97% 96% 100%    Intake/Output Summary (Last 24 hours) at 07/07/2020 0857 Last data filed at 07/07/2020 0036 Gross per 24 hour  Intake 1965.95 ml  Output --  Net 1965.95 ml    09/18 1901 - 09/20 0700 In: 1966  Out: -   There were no vitals filed for this visit.  Physical Examination:    General: Appears in no acute distress  Cardiovascular: S1-S2, regular, no murmur auscultated  Respiratory: Clear to auscultation bilaterally  Abdomen: Abdomen is soft, nontender, no organomegaly  Extremities: No edema in the lower extremities  Neurologic: Alert, oriented x3, intact insight and judgment    Data Reviewed:   No results found for this or any previous visit (from the past 240 hour(s)).  No results for input(s): LIPASE, AMYLASE in the last 168 hours. No results for input(s): AMMONIA in the last 168 hours.  Cardiac Enzymes: No results for input(s): CKTOTAL, CKMB, CKMBINDEX, TROPONINI in the last 168 hours. BNP (last 3 results) Recent Labs    01/02/20 1430 07/06/20 2246  BNP 90.8 67.0    ProBNP (last 3 results) No results for input(s): PROBNP in the last 8760 hours.  Studies:  CT ABDOMEN PELVIS WO CONTRAST  Result Date: 07/06/2020 CLINICAL DATA:  COVID-19 positivity, abdominal pain with rebound tenderness EXAM: CT ABDOMEN AND PELVIS WITHOUT CONTRAST TECHNIQUE: Multidetector CT imaging of the abdomen and pelvis was performed following the standard protocol without IV contrast.  COMPARISON:  06/02/2020 FINDINGS: Lower chest: Very mild patchy opacities are noted consistent with the given clinical history. No sizable effusion is seen. Hiatal hernia is noted and stable. Hepatobiliary: No focal liver abnormality is seen. No gallstones, gallbladder wall thickening, or biliary dilatation. Pancreas: Unremarkable. No pancreatic ductal dilatation or surrounding inflammatory changes. Spleen:  Normal in size without focal abnormality. Adrenals/Urinary Tract: Adrenal glands are within normal limits. Renal vascular calcifications are noted bilaterally. The right kidney is somewhat shrunken compared to the left but stable in appearance from the prior study. No obstructive changes are seen. The bladder is well distended. Stomach/Bowel: Scattered diverticular change of the colon is noted without evidence of diverticulitis. The appendix is within normal limits. Small bowel and stomach are within normal limits aside from the previously described hiatal hernia. Vascular/Lymphatic: Diffuse atherosclerotic changes are noted. No significant lymphadenopathy is seen. 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 The overall appearance is stable from the prior exam. Reproductive: Uterus and bilateral adnexa are unremarkable. Other: No abdominal wall hernia or abnormality. No abdominopelvic ascites. Musculoskeletal: Postsurgical changes are noted in the right hip as well as the lumbar spine stable in appearance from the prior exam. No acute fracture is seen. IMPRESSION: Chronic changes stable in appearance from the prior CT examination. No acute abnormality to correspond with the patient's physical exam is identified. Electronically Signed   By: Inez Catalina M.D.   On: 07/06/2020 22:44   DG Chest Port 1 View  Result Date: 07/06/2020 CLINICAL DATA:  Sepsis. Patient had positive COVID-19 test 2 months ago. Shortness of breath and cough.  EXAM: PORTABLE CHEST 1 VIEW COMPARISON:  June 09, 2020 FINDINGS: The heart, hila, and mediastinum are normal. No pneumothorax. No nodules or masses. No focal infiltrates. IMPRESSION: No active disease. Electronically Signed   By: Dorise Bullion III M.D   On: 07/06/2020 16:18       Oswald Hillock   [facility] If 7PM-7AM, please contact night-coverage at [email or web address], Office  [phone]   07/07/2020, 8:57 AM  LOS: 1 day

## 2020-07-07 NOTE — ED Notes (Signed)
Pt is requesting sleeping medication.  MD notified and new order received

## 2020-07-07 NOTE — ED Notes (Signed)
Pt moved to hospital bed and repositioned for comfort.

## 2020-07-07 NOTE — Progress Notes (Signed)
D.Dimer of 3.4, better than discharge last month.  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  Note similar symptoms in Jan with this diagnosis this year.  ABG likewise today shows metabolic acidosis with respiratory compensation.

## 2020-07-07 NOTE — ED Notes (Signed)
Waiting for hospital bed, will move pt for comfort once it arrives

## 2020-07-07 NOTE — ED Notes (Signed)
Straightened pt sheets, placed pt on purewick and in depend to void.  Hospital bed ordered for pt added comfort

## 2020-07-07 NOTE — ED Notes (Signed)
PT back from [facility]

## 2020-07-07 NOTE — ED Notes (Signed)
Patient transported to [place] by this RN. Patient remains AAOx3 and pleasant. Respirations even and nonlabored. NAD noted. Primary RN Janett Billow denies further questions.

## 2020-07-08 ENCOUNTER — Ambulatory Visit: Payer: Medicare HMO | Admitting: Neurology

## 2020-07-08 ENCOUNTER — Other Ambulatory Visit: Payer: Self-pay

## 2020-07-08 LAB — RENAL FUNCTION PANEL
Albumin: 2.3 g/dL — ABNORMAL LOW (ref 3.5–5.0)
Anion gap: 12 (ref 5–15)
BUN: 20 mg/dL (ref 8–23)
CO2: 23 mmol/L (ref 22–32)
Calcium: 8.4 mg/dL — ABNORMAL LOW (ref 8.9–10.3)
Chloride: 104 mmol/L (ref 98–111)
Creatinine, Ser: 1.69 mg/dL — ABNORMAL HIGH (ref 0.44–1.00)
GFR calc Af Amer: 34 mL/min — ABNORMAL LOW (ref >60)
GFR calc non Af Amer: 29 mL/min — ABNORMAL LOW (ref >60)
Glucose, Bld: 94 mg/dL (ref 70–99)
Phosphorus: 3 mg/dL (ref 2.5–4.6)
Potassium: 4.4 mmol/L (ref 3.5–5.1)
Sodium: 139 mmol/L (ref 135–145)

## 2020-07-08 LAB — IRON AND TIBC
Iron: 32 ug/dL (ref 28–170)
Saturation Ratios: 16 % (ref 10.4–31.8)
TIBC: 197 ug/dL — ABNORMAL LOW (ref 250–450)
UIBC: 165 ug/dL

## 2020-07-08 LAB — FERRITIN: Ferritin: 1391 ng/mL — ABNORMAL HIGH (ref 11–307)

## 2020-07-08 LAB — CORTISOL-AM, BLOOD: Cortisol - AM: 10.3 ug/dL (ref 6.7–22.6)

## 2020-07-08 LAB — MRSA PCR SCREENING: MRSA by PCR: NEGATIVE

## 2020-07-08 LAB — ETHYLENE GLYCOL: Ethylene Glycol Lvl: <5 mg/dL

## 2020-07-08 MED ORDER — HYDRALAZINE HCL 20 MG/ML IJ SOLN
5.0000 mg | INTRAMUSCULAR | Status: DC | PRN
  Administered 2020-07-08 – 2020-07-09 (×2): 5 mg via INTRAVENOUS
  Filled 2020-07-08 (×2): qty 1

## 2020-07-08 MED ORDER — DARBEPOETIN ALFA 25 MCG/0.42ML IJ SOSY
25.0000 ug | PREFILLED_SYRINGE | INTRAMUSCULAR | Status: DC
  Filled 2020-07-08: qty 0.42

## 2020-07-08 MED ORDER — PROPRANOLOL HCL 40 MG PO TABS
40.0000 mg | ORAL_TABLET | Freq: Two times a day (BID) | ORAL | Status: DC
  Administered 2020-07-08 – 2020-07-10 (×5): 40 mg via ORAL
  Filled 2020-07-08 (×6): qty 1

## 2020-07-08 MED ORDER — SODIUM BICARBONATE 650 MG PO TABS
650.0000 mg | ORAL_TABLET | Freq: Two times a day (BID) | ORAL | Status: DC
  Administered 2020-07-08 – 2020-07-10 (×5): 650 mg via ORAL
  Filled 2020-07-08 (×5): qty 1

## 2020-07-08 MED ORDER — LOPERAMIDE HCL 2 MG PO CAPS
2.0000 mg | ORAL_CAPSULE | ORAL | Status: DC | PRN
  Administered 2020-07-08: 2 mg via ORAL
  Filled 2020-07-08: qty 1

## 2020-07-08 MED ORDER — ENSURE ENLIVE PO LIQD
237.0000 mL | Freq: Three times a day (TID) | ORAL | Status: DC
  Administered 2020-07-08 – 2020-07-10 (×5): 237 mL via ORAL

## 2020-07-08 NOTE — Progress Notes (Signed)
Patient ID: Alyssa [name], female   DOB: 23-Dec-1940, 79 y.o.   MRN: [id number] S: Feels better today.  No SOB or DOE. O:BP (!) 167/114 (BP Location: Right Arm)   Pulse (!) 108   Temp 99 F (37.2 C) (Oral)   Resp 20   Ht 5\' 3"  (1.6 m)   Wt 43.7 kg   SpO2 98%   BMI 17.07 kg/m   Intake/Output Summary (Last 24 hours) at 07/08/2020 0942 Last data filed at 07/08/2020 0517 Gross per 24 hour  Intake 2940.42 ml  Output --  Net 2940.42 ml   Intake/Output: I/O last 3 completed shifts: In: 4308.3 [P.O.:120; I.V.:2620.4; IV Piggyback:1567.9] Out: -   Intake/Output this shift:  No intake/output data recorded. Weight change:  Gen: cachectic elderly female in NAD CVS: tachy at 9 Resp: cta Abd: +BS, soft, NT/ND Ext: no edema  Recent Labs  Lab 07/06/20 1538 07/06/20 2147 07/07/20 2133 07/08/20 0112  NA 139 143 138 139  K 4.8 4.3 4.8 4.4  CL 115*  --  103 104  CO2 9*  --  21* 23  GLUCOSE 171*  --  91 94  BUN 34*  --  22 20  CREATININE 2.11*  --  1.75* 1.69*  ALBUMIN 2.8*  --   --  2.3*  CALCIUM 9.1  --  8.5* 8.4*  PHOS  --   --   --  3.0  AST 26  --   --   --   ALT 16  --   --   --    Liver Function Tests: Recent Labs  Lab 07/06/20 1538 07/08/20 0112  AST 26  --   ALT 16  --   ALKPHOS 101  --   BILITOT 0.4  --   PROT 7.0  --   ALBUMIN 2.8* 2.3*   No results for input(s): LIPASE, AMYLASE in the last 168 hours. No results for input(s): AMMONIA in the last 168 hours. CBC: Recent Labs  Lab 07/06/20 1538 07/06/20 2147  WBC 8.2  --   NEUTROABS 4.9  --   HGB 9.1* 7.8*  HCT 29.5* 23.0*  MCV 89.4  --   PLT 750*  --    Cardiac Enzymes: No results for input(s): CKTOTAL, CKMB, CKMBINDEX, TROPONINI in the last 168 hours. CBG: No results for input(s): GLUCAP in the last 168 hours.  Iron Studies: No results for input(s): IRON, TIBC, TRANSFERRIN, FERRITIN in the last 72 hours. Studies/Results: CT ABDOMEN PELVIS WO CONTRAST  Result Date: 07/06/2020 CLINICAL  DATA:  COVID-19 positivity, abdominal pain with rebound tenderness EXAM: CT ABDOMEN AND PELVIS WITHOUT CONTRAST TECHNIQUE: Multidetector CT imaging of the abdomen and pelvis was performed following the standard protocol without IV contrast. COMPARISON:  06/02/2020 FINDINGS: Lower chest: Very mild patchy opacities are noted consistent with the given clinical history. No sizable effusion is seen. Hiatal hernia is noted and stable. Hepatobiliary: No focal liver abnormality is seen. No gallstones, gallbladder wall thickening, or biliary dilatation. Pancreas: Unremarkable. No pancreatic ductal dilatation or surrounding inflammatory changes. Spleen: Normal in size without focal abnormality. Adrenals/Urinary Tract: Adrenal glands are within normal limits. Renal vascular calcifications are noted bilaterally. The right kidney is somewhat shrunken compared to the left but stable in appearance from the prior study. No obstructive changes are seen. The bladder is well distended. Stomach/Bowel: Scattered diverticular change of the colon is noted without evidence of diverticulitis. The appendix is within normal limits. Small bowel and stomach are within normal limits aside  from the previously described hiatal hernia. Vascular/Lymphatic: Diffuse atherosclerotic changes are noted. No significant lymphadenopathy is seen. 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 The overall appearance is stable from the prior exam. Reproductive: Uterus and bilateral adnexa are unremarkable. Other: No abdominal wall hernia or abnormality. No abdominopelvic ascites. Musculoskeletal: Postsurgical changes are noted in the right hip as well as the lumbar spine stable in appearance from the prior exam. No acute fracture is seen. IMPRESSION: Chronic changes stable in appearance from the prior CT examination. No acute abnormality to correspond with the patient's physical exam is  identified. Electronically Signed   By: Inez Catalina M.D.   On: 07/06/2020 22:44   NM Pulmonary Perfusion  Result Date: 07/07/2020 CLINICAL DATA:  Shortness of breath EXAM: NUCLEAR MEDICINE PERFUSION LUNG SCAN TECHNIQUE: Perfusion images were obtained in multiple projections after intravenous injection of radiopharmaceutical. Ventilation scans intentionally deferred if perfusion scan and chest x-ray adequate for interpretation during COVID 19 epidemic. RADIOPHARMACEUTICALS:  4.1 mCi Tc-64m MAA IV COMPARISON:  07/06/2020 FINDINGS: Perfusion images demonstrate adequate uptake throughout both lungs. No definitive which shaped defect is identified to suggest pulmonary embolism. IMPRESSION: No evidence of pulmonary embolism. Electronically Signed   By: Inez Catalina M.D.   On: 07/07/2020 21:18   DG Chest Port 1 View  Result Date: 07/06/2020 CLINICAL DATA:  Sepsis. Patient had positive COVID-19 test 2 months ago. Shortness of breath and cough. EXAM: PORTABLE CHEST 1 VIEW COMPARISON:  June 09, 2020 FINDINGS: The heart, hila, and mediastinum are normal. No pneumothorax. No nodules or masses. No focal infiltrates. IMPRESSION: No active disease. Electronically Signed   By: Dorise Bullion III M.D   On: 07/06/2020 16:18   . amLODipine  2.5 mg Oral Daily  . citalopram  20 mg Oral Daily  . cloNIDine  0.1 mg Oral BID  . clopidogrel  75 mg Oral Daily  . donepezil  5 mg Oral Daily  . DULoxetine  30 mg Oral Daily  . gabapentin  100 mg Oral TID  . heparin  5,000 Units Subcutaneous Q8H  . hydrOXYzine  25 mg Oral Daily  . methocarbamol  500 mg Oral TID  . pantoprazole  40 mg Oral Daily  . propranolol  40 mg Oral BID  . rosuvastatin  40 mg Oral Daily  . valproic acid  250 mg Oral QHS    BMET    Component Value Date/Time   NA 139 07/08/2020 0112   K 4.4 07/08/2020 0112   CL 104 07/08/2020 0112   CO2 23 07/08/2020 0112   GLUCOSE 94 07/08/2020 0112   BUN 20 07/08/2020 0112   CREATININE 1.69 (H)  07/08/2020 0112   CALCIUM 8.4 (L) 07/08/2020 0112   GFRNONAA 29 (L) 07/08/2020 0112   GFRAA 34 (L) 07/08/2020 0112   CBC    Component Value Date/Time   WBC 8.2 07/06/2020 1538   RBC 3.30 (L) 07/06/2020 1538   HGB 7.8 (L) 07/06/2020 2147   HCT 23.0 (L) 07/06/2020 2147   PLT 750 (H) 07/06/2020 1538   MCV 89.4 07/06/2020 1538   MCH 27.6 07/06/2020 1538   MCHC 30.8 07/06/2020 1538   RDW 17.3 (H) 07/06/2020 1538   LYMPHSABS 1.8 07/06/2020 1538   MONOABS 1.1 (H) 07/06/2020 1538   EOSABS 0.2 07/06/2020 1538   BASOSABS 0.1 07/06/2020 1538     Assessment/Plan: 1. Mixed respiratory alkalosis with Metabolic acidosis - likely the source of her  DOE and tachypnea given negative CXR, and V/Q scan.  She has underlying COPD as well as CKD stage IV.   1. Improved with isotonic bicarb 2. She admits that she stopped taking sodium bicarb at home "for quite some time" 3. Stop IV bicarb and start po at 650 mg bid. 4. Follow up with Dr. Joelyn Oms in our office after discharge 5. Nothing further to add will sign off.  Please call with questions or concerns. bicarb and follow.   2. 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  Scr below baseline.  Follow up with Dr. Joelyn Oms after discharge. 3. DOE- workup as above.  She does have h/o tobacco abuse and COPD.  feels better now that CO2 has normalized. 4. HTN- bp elevated now.  Restarted on home meds per primary 5. 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  Follow iron and will likely need to start ESA.  Donetta Potts, MD [employer] [phone]

## 2020-07-08 NOTE — Progress Notes (Signed)
[facility]  PROGRESS NOTE  Alyssa [name] LOV:[id number] DOB: [birth year] DOA: 07/06/2020 PCP: [name], DO   Brief HPI:   79 year old female history of CKD stage IV, hypertension, PAD who was admitted with COVID-19 and acute kidney injury last month.  Is currently not on home oxygen.  Since discharge patient reports progressively worsening shortness of breath.  Today she had shortness of breath, fever chills and rigors EMS was called.  Temperature was 100.8. Chest x-ray was negative for pneumonia. Patient started on Rocephin and Zithromax for pneumonia   Subjective   Patient seen and examined, blood pressure still elevated.  Bicarb is up to 23.  Nephrology has discontinued bicarb infusion.  VQ scan is negative for pulmonary embolism.  Patient still has tremors.   Assessment/Plan:     1. Dyspnea-unclear etiology, resolved.  Chest x-ray shows no infiltrate. D-dimer elevated at 3.43. VQ scan is negative for PE.  BNP is 67.0, troponin XIX, 20. Lactic acid 2.0. She was treated for COVID-19 pneumonia last month. 2. Benign essential tremor-start propranolol 40 mg p.o. twice daily.  She also has uncontrolled blood pressure.  Propranolol should help with that too. 3. Sepsis ruled out-patient presented with tachypnea, tachycardia, lactic acid 2.0 with suspected source of infection in lungs. She was started on Rocephin and Zithromax. Chest x-ray was clear, CT abdomen/pelvis unremarkable. UA shows large leukocytes, negative nitrite. Follow urine and blood culture results.  Cultures remain negative.  Discontinue antibiotics 4. Metabolic acidosis/CKD stage IV-patient's bicarb was only 9.  Creatinine 1.69 at baseline.    She is followed by nephrology as outpatient.  Patient was prescribed sodium bicarb tablets 1300 mg twice a day.  Patient says she was not compliant with her bicarb tablets.  She was started on IV bicarb infusion, bicarb has improved to 23 today.  Nephrology has discontinued  IV bicarb infusion.  Continue sodium bicarb tablets.  5. Hypertension-blood pressure is still elevated despite starting clonidine.  Continue amlodipine 2.5 mg daily, also added on propranolol 40 mg p.o. twice daily as above.       COVID-19 Labs  Recent Labs    07/06/20 2246  DDIMER 3.42*    Lab Results  Component Value Date   SARSCOV2NAA Detected (A) 06/20/2020   SARSCOV2NAA POSITIVE (A) 06/02/2020   [place] NEGATIVE 01/06/2020   [place] NEGATIVE 01/02/2020     Scheduled medications:   . amLODipine  2.5 mg Oral Daily  . citalopram  20 mg Oral Daily  . cloNIDine  0.1 mg Oral BID  . clopidogrel  75 mg Oral Daily  . darbepoetin (ARANESP) injection - NON-DIALYSIS  25 mcg Subcutaneous Q Tue-1800  . donepezil  5 mg Oral Daily  . DULoxetine  30 mg Oral Daily  . feeding supplement (ENSURE ENLIVE)  237 mL Oral TID BM  . gabapentin  100 mg Oral TID  . heparin  5,000 Units Subcutaneous Q8H  . hydrOXYzine  25 mg Oral Daily  . methocarbamol  500 mg Oral TID  . pantoprazole  40 mg Oral Daily  . propranolol  40 mg Oral BID  . rosuvastatin  40 mg Oral Daily  . sodium bicarbonate  650 mg Oral BID  . valproic acid  250 mg Oral QHS         SpO2: 99 % O2 Flow Rate (L/min): 2 L/min    CBC: Recent Labs  Lab 07/06/20 1538 07/06/20 2147  WBC 8.2  --   NEUTROABS 4.9  --   HGB 9.1*  7.8*  HCT 29.5* 23.0*  MCV 89.4  --   PLT 750*  --     Basic Metabolic Panel: Recent Labs  Lab 07/06/20 1538 07/06/20 2147 07/07/20 2133 07/08/20 0112  NA 139 143 138 139  K 4.8 4.3 4.8 4.4  CL 115*  --  103 104  CO2 9*  --  21* 23  GLUCOSE 171*  --  91 94  BUN 34*  --  22 20  CREATININE 2.11*  --  1.75* 1.69*  CALCIUM 9.1  --  8.5* 8.4*  PHOS  --   --   --  3.0     Liver Function Tests: Recent Labs  Lab 07/06/20 1538 07/08/20 0112  AST 26  --   ALT 16  --   ALKPHOS 101  --   BILITOT 0.4  --   PROT 7.0  --   ALBUMIN 2.8* 2.3*     Antibiotics: Anti-infectives  (From admission, onward)   Start     Dose/Rate Route Frequency Ordered Stop   07/07/20 1500  cefTRIAXone (ROCEPHIN) 2 g in sodium chloride 0.9 % 100 mL IVPB        2 g 200 mL/hr over 30 Minutes Intravenous Every 24 hours 07/06/20 2134     07/06/20 2145  metroNIDAZOLE (FLAGYL) IVPB 500 mg        500 mg 100 mL/hr over 60 Minutes Intravenous Every 8 hours 07/06/20 2134     07/06/20 1515  cefTRIAXone (ROCEPHIN) 2 g in sodium chloride 0.9 % 100 mL IVPB  Status:  Discontinued        2 g 200 mL/hr over 30 Minutes Intravenous Every 24 hours 07/06/20 1503 07/06/20 2130   07/06/20 1515  azithromycin (ZITHROMAX) 500 mg in sodium chloride 0.9 % 250 mL IVPB  Status:  Discontinued        500 mg 250 mL/hr over 60 Minutes Intravenous Every 24 hours 07/06/20 1503 07/06/20 2130       DVT prophylaxis: Heparin  Code Status: Full code  Family Communication: No family at bedside    Status is: Inpatient  Dispo: The patient is from: Home              Anticipated d/c is to: Home              Anticipated d/c date is: 07/09/2020              Patient currently not medically stable for discharge  Barrier to discharge-acute hypoxemic respiratory failure     Consultants:    Procedures:     Objective   Vitals:   07/08/20 0600 07/08/20 0700 07/08/20 0800 07/08/20 1100  BP: (!) 155/108 (!) 169/105 (!) 167/114 132/90  Pulse:  (!) 116 (!) 108 91  Resp:  20 20 17   Temp:  98 F (36.7 C) 99 F (37.2 C)   TempSrc:  Oral Oral   SpO2:  93% 98% 99%  Weight:      Height:        Intake/Output Summary (Last 24 hours) at 07/08/2020 1302 Last data filed at 07/08/2020 1046 Gross per 24 hour  Intake 3060.42 ml  Output --  Net 3060.42 ml    09/19 1901 - 09/21 0700 In: 4308.3 [P.O.:120; I.V.:2620.4] Out: -   Filed Weights   07/07/20 2112  Weight: 43.7 kg    Physical Examination:    General-appears in no acute distress  Heart-S1-S2, regular, no murmur auscultated  Lungs-clear to  auscultation  bilaterally, no wheezing or crackles auscultated  Abdomen-soft, nontender, no organomegaly  Extremities-mild tremors noted in the hands.  Neuro-alert, oriented x3, no focal deficit noted    Data Reviewed:   Recent Results (from the past 240 hour(s))  Culture, blood (x 2)     Status: None (Preliminary result)   Collection Time: 07/06/20  3:02 PM   Specimen: BLOOD  Result Value Ref Range Status   Specimen Description BLOOD RIGHT ANTECUBITAL  Final   Special Requests   Final    BOTTLES DRAWN AEROBIC AND ANAEROBIC Blood Culture results may not be optimal due to an inadequate volume of blood received in culture bottles   Culture   Final    NO GROWTH 2 DAYS Performed at [facility] [address] [place], [place] [address]    Report Status PENDING  Incomplete  Culture, Urine     Status: Abnormal   Collection Time: 07/06/20  6:56 PM   Specimen: Urine, Random  Result Value Ref Range Status   Specimen Description URINE, RANDOM  Final   Special Requests   Final    NONE Performed at [facility], [place] [address], [place], [place] [address]    Culture MULTIPLE SPECIES PRESENT, SUGGEST RECOLLECTION (A)  Final   Report Status 07/07/2020 FINAL  Final  MRSA PCR Screening     Status: None   Collection Time: 07/07/20 10:15 PM   Specimen: Nasopharyngeal  Result Value Ref Range Status   MRSA by PCR NEGATIVE NEGATIVE Final    Comment:        The GeneXpert MRSA Assay (FDA approved for NASAL specimens only), is one component of a comprehensive MRSA colonization surveillance program. It is not intended to diagnose MRSA infection nor to guide or monitor treatment for MRSA infections. Performed at [facility], [place] [address] [place], [place] [address]     No results for input(s): LIPASE, AMYLASE in the last 168 hours. No results for input(s): AMMONIA in the last 168 hours.  Cardiac Enzymes: No results for input(s): CKTOTAL, CKMB, CKMBINDEX, TROPONINI  in the last 168 hours. BNP (last 3 results) Recent Labs    01/02/20 1430 07/06/20 2246  BNP 90.8 67.0    ProBNP (last 3 results) No results for input(s): PROBNP in the last 8760 hours.  Studies:  CT ABDOMEN PELVIS WO CONTRAST  Result Date: 07/06/2020 CLINICAL DATA:  COVID-19 positivity, abdominal pain with rebound tenderness EXAM: CT ABDOMEN AND PELVIS WITHOUT CONTRAST TECHNIQUE: Multidetector CT imaging of the abdomen and pelvis was performed following the standard protocol without IV contrast. COMPARISON:  06/02/2020 FINDINGS: Lower chest: Very mild patchy opacities are noted consistent with the given clinical history. No sizable effusion is seen. Hiatal hernia is noted and stable. Hepatobiliary: No focal liver abnormality is seen. No gallstones, gallbladder wall thickening, or biliary dilatation. Pancreas: Unremarkable. No pancreatic ductal dilatation or surrounding inflammatory changes. Spleen: Normal in size without focal abnormality. Adrenals/Urinary Tract: Adrenal glands are within normal limits. Renal vascular calcifications are noted bilaterally. The right kidney is somewhat shrunken compared to the left but stable in appearance from the prior study. No obstructive changes are seen. The bladder is well distended. Stomach/Bowel: Scattered diverticular change of the colon is noted without evidence of diverticulitis. The appendix is within normal limits. Small bowel and stomach are within normal limits aside from the previously described hiatal hernia. Vascular/Lymphatic: Diffuse atherosclerotic changes are noted. No significant lymphadenopathy is seen. 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 The overall appearance is stable from the prior exam. Reproductive: Uterus and bilateral adnexa are unremarkable. Other: No abdominal wall hernia or abnormality. No abdominopelvic ascites. Musculoskeletal: Postsurgical changes  are noted in the right hip as well as the lumbar spine stable in appearance from the prior exam. No acute fracture is seen. IMPRESSION: Chronic changes stable in appearance from the prior CT examination. No acute abnormality to correspond with the patient's physical exam is identified. Electronically Signed   By: Inez Catalina M.D.   On: 07/06/2020 22:44   NM Pulmonary Perfusion  Result Date: 07/07/2020 CLINICAL DATA:  Shortness of breath EXAM: NUCLEAR MEDICINE PERFUSION LUNG SCAN TECHNIQUE: Perfusion images were obtained in multiple projections after intravenous injection of radiopharmaceutical. Ventilation scans intentionally deferred if perfusion scan and chest x-ray adequate for interpretation during COVID 19 epidemic. RADIOPHARMACEUTICALS:  4.1 mCi Tc-21m MAA IV COMPARISON:  07/06/2020 FINDINGS: Perfusion images demonstrate adequate uptake throughout both lungs. No definitive which shaped defect is identified to suggest pulmonary embolism. IMPRESSION: No evidence of pulmonary embolism. Electronically Signed   By: Inez Catalina M.D.   On: 07/07/2020 21:18   DG Chest Port 1 View  Result Date: 07/06/2020 CLINICAL DATA:  Sepsis. Patient had positive COVID-19 test 2 months ago. Shortness of breath and cough. EXAM: PORTABLE CHEST 1 VIEW COMPARISON:  June 09, 2020 FINDINGS: The heart, hila, and mediastinum are normal. No pneumothorax. No nodules or masses. No focal infiltrates. IMPRESSION: No active disease. Electronically Signed   By: Dorise Bullion III M.D   On: 07/06/2020 16:18       Oswald Hillock   [facility] If 7PM-7AM, please contact night-coverage at [email or web address], Office  [phone]   07/08/2020, 1:02 PM  LOS: 2 days

## 2020-07-08 NOTE — Progress Notes (Signed)
Initial Nutrition Assessment  DOCUMENTATION CODES:   Underweight, Severe malnutrition in context of chronic illness  INTERVENTION:   - Ensure Enlive po TID, each supplement provides 350 kcal and 20 grams of protein  - Liberalize diet to Regular, verbal with readback placed per MD  NUTRITION DIAGNOSIS:   Severe Malnutrition related to chronic illness (COPD, dementia) as evidenced by moderate fat depletion, severe fat depletion, moderate muscle depletion, severe muscle depletion.  GOAL:   Patient will meet greater than or equal to 90% of their needs  MONITOR:   PO intake, Supplement acceptance, Labs, Weight trends, I & O's  REASON FOR ASSESSMENT:   Malnutrition Screening Tool    ASSESSMENT:   79 year old female who presented on 9/19 with SOB. PMH of CKD stage IV, HTN, PAD, COPD, dementia, diffuse ASCVD (h/o fem-fem bypass, right renal occlusion, mild left RAS), recent admission for COVID-19 and AKI.   Spoke with pt at bedside. Pt reports appetite is improving since she was admitted. Pt endorses poor appetite and decreased PO intake for about 1 week PTA. She states that she wasn't feeling well and didn't want to eat. She wasn't eating her typical 2 meals a day but she was still drinking her 2 Ensure supplements daily.  Pt reports that when she feels good, she consumes 2 meals daily, snacks between meals, and drinks 2 Ensure supplements daily.  Pt endorses weight loss and reports it began one week ago when she started having decreased appetite and PO intake. Reviewed weight history in chart. Pt's weight has fluctuated between 42-45 kg over the last 7 months with no real trend in either direction.  Pt with a 0.8 kg weight loss x 1 month. This is a 1.8% weight loss which is not significant for timeframe.  Pt willing to consume 3 Ensure Enlive supplements during admission. Discussed diet liberalization with MD who agreed. Verbal with readback order placed for Regular diet.  Meal  Completion: 20% x 1 documented meal  Medications reviewed and include: protonix, IV abx, sodium bicarb @ 100 ml/hr  Labs reviewed.  NUTRITION - FOCUSED PHYSICAL EXAM:    Most Recent Value  Orbital Region Moderate depletion  Upper Arm Region Severe depletion  Thoracic and Lumbar Region Severe depletion  Buccal Region Moderate depletion  Temple Region Moderate depletion  Clavicle Bone Region Severe depletion  Clavicle and Acromion Bone Region Severe depletion  Scapular Bone Region Moderate depletion  Dorsal Hand Moderate depletion  Patellar Region Severe depletion  Anterior Thigh Region Severe depletion  Posterior Calf Region Severe depletion  Edema (RD Assessment) None  Hair Reviewed  Eyes Reviewed  Mouth Reviewed  Skin Reviewed  Nails Reviewed       Diet Order:   Diet Order            Diet Heart Room service appropriate? Yes; Fluid consistency: Thin  Diet effective now                 EDUCATION NEEDS:   Education needs have been addressed  Skin:  Skin Assessment: Reviewed RN Assessment  Last BM:  07/08/20  Height:   Ht Readings from Last 1 Encounters:  07/07/20 5\' 3"  (1.6 m)    Weight:   Wt Readings from Last 1 Encounters:  07/07/20 43.7 kg    Ideal Body Weight:  52.3 kg  BMI:  Body mass index is 17.07 kg/m.  Estimated Nutritional Needs:   Kcal:  1500-1700  Protein:  70-85 grams  Fluid:  1.5-1.7 L  Gaynell Face, MS, RD, LDN Inpatient Clinical Dietitian Please see AM[facility] for contact information.

## 2020-07-08 NOTE — Plan of Care (Signed)

## 2020-07-09 ENCOUNTER — Inpatient Hospital Stay (HOSPITAL_COMMUNITY): Payer: Medicare HMO

## 2020-07-09 LAB — RENAL FUNCTION PANEL
Albumin: 2.2 g/dL — ABNORMAL LOW (ref 3.5–5.0)
Anion gap: 11 (ref 5–15)
BUN: 21 mg/dL (ref 8–23)
CO2: 27 mmol/L (ref 22–32)
Calcium: 8.7 mg/dL — ABNORMAL LOW (ref 8.9–10.3)
Chloride: 102 mmol/L (ref 98–111)
Creatinine, Ser: 1.81 mg/dL — ABNORMAL HIGH (ref 0.44–1.00)
GFR calc Af Amer: 31 mL/min — ABNORMAL LOW (ref >60)
GFR calc non Af Amer: 27 mL/min — ABNORMAL LOW (ref >60)
Glucose, Bld: 129 mg/dL — ABNORMAL HIGH (ref 70–99)
Phosphorus: 3.3 mg/dL (ref 2.5–4.6)
Potassium: 4.5 mmol/L (ref 3.5–5.1)
Sodium: 140 mmol/L (ref 135–145)

## 2020-07-09 MED ORDER — DARBEPOETIN ALFA 25 MCG/0.42ML IJ SOSY
25.0000 ug | PREFILLED_SYRINGE | INTRAMUSCULAR | Status: DC
  Administered 2020-07-09: 25 ug via SUBCUTANEOUS
  Filled 2020-07-09: qty 0.42

## 2020-07-09 NOTE — Progress Notes (Signed)
PROGRESS NOTE  Alyssa [name]  DOB: [birth year]  PCP: [name], DO YQM:[id number]  DOA: 07/06/2020  LOS: 3 days   Chief Complaint  Patient presents with  . Shortness of Breath   Brief narrative: Patient is a 79 year old female history of CKD stage IV, hypertension, PAD who was admitted with COVID-19 and acute kidney injury last month. Since discharge patient has been experiencing progressively worsening shortness of breath.  9/19, she had shortness of breath, fever chills and rigors EMS was called.   EMS noted a temperature of 100.8.  In the ED, patient had a temperature of 99, tachycardic to 120, respiratory 25, blood pressure 160/102 Labs with lactate level of 3.2, serum bicarbonate level 9, anion gap 15 Chest x-ray did not show any acute infiltrates. Blood gas showed normal pH of 7.4, PCO2 low at 19, serum bicarb low at 12 Urinalysis showed large leukocyte esterase, negative nitrates, 6-10 WBCs, negative for blood.  Patient was admitted to hospitalist service for further evaluation management.  Subjective: Seen and examined this morning. Pleasant elderly [name].  Lying down in bed.  Not in distress. No fever last 24 hours.  Heart rate improved to 90s, blood pressure elevated mostly in 150s.  Breathing comfortably on room air. Ambulated with physical therapy today. Later in the morning, patient started feeling abdomen pain, headache, she had 1 episode of watery diarrhea with belly cramps.  Assessment/Plan: Acute on chronic metabolic acidosis with respiratory compensation -Patient has chronic metabolic acidosis and was supposed to be on sodium bicarbonate tablet which apparently she was not compliant to. -She presented with a low bicarbonate level of 9.  Blood gas showed PCO2 level at 19. -Patient probably was hyperventilating to generate respiratory compensation for acute worsening of metabolic acidosis. -Problem embolism ruled out by a negative VQ  scan. -Nephrology consult appreciated.  She was started on bicarbonate drip and subsequently switched to resume sodium bicarbonate tablets 1300 mg twice daily. -Continue to follow-up with nephrology as an outpatient  Sepsis ruled out -Initially suspected of sepsis because of respiratory distress. -Chest x-ray and CT imaging did not show any new infiltrates in lungs. -Sepsis ruled out now.  Antibiotics stopped.  Falls/tremors -Patient reports tremors and intermittent falls at home. -We will obtain an MRI of her brain to rule out posterior circulation stroke. -PT evaluation was obtained.  Patient is functioning as ambulating independently. -Previous hospitalist started the patient on propanolol for tremors.  COVID-19 pneumonia last month  CKD stage IV -Creatinine at baseline at 1.69.   Hypertension -continue clonidine, amlodipine.  Added propanolol this hospitalization.    Acute diarrhea -1 episode of acute watery diarrhea today.  Unclear cause.   -Continue to monitor.  Mobility: PT eval obtained. Code Status:   Code Status: Full Code  Nutritional status: Body mass index is 17.07 kg/m. Nutrition Problem: Severe Malnutrition Etiology: chronic illness (COPD, dementia) Signs/Symptoms: moderate fat depletion, severe fat depletion, moderate muscle depletion, severe muscle depletion Diet Order            Diet regular Room service appropriate? Yes; Fluid consistency: Thin  Diet effective now                 DVT prophylaxis: heparin injection 5,000 Units Start: 07/06/20 2200   Antimicrobials:  None Fluid: None Consultants: Nephrology Family Communication:  None at bedside  Status is: Inpatient  Remains inpatient appropriate because: Has new acute diarrhea.  Abdominal pain.   Dispo: The patient is from: Home  Anticipated d/c is to: Home              Anticipated d/c date is: 2 days              Patient currently is not medically stable to  d/c.       Infusions:  . sodium chloride Stopped (07/08/20 1941)    Scheduled Meds: . amLODipine  2.5 mg Oral Daily  . citalopram  20 mg Oral Daily  . cloNIDine  0.1 mg Oral BID  . clopidogrel  75 mg Oral Daily  . darbepoetin (ARANESP) injection - NON-DIALYSIS  25 mcg Subcutaneous Q Tue-1800  . donepezil  5 mg Oral Daily  . DULoxetine  30 mg Oral Daily  . feeding supplement (ENSURE ENLIVE)  237 mL Oral TID BM  . gabapentin  100 mg Oral TID  . heparin  5,000 Units Subcutaneous Q8H  . hydrOXYzine  25 mg Oral Daily  . methocarbamol  500 mg Oral TID  . pantoprazole  40 mg Oral Daily  . propranolol  40 mg Oral BID  . rosuvastatin  40 mg Oral Daily  . sodium bicarbonate  650 mg Oral BID  . valproic acid  250 mg Oral QHS    Antimicrobials: Anti-infectives (From admission, onward)   Start     Dose/Rate Route Frequency Ordered Stop   07/07/20 1500  cefTRIAXone (ROCEPHIN) 2 g in sodium chloride 0.9 % 100 mL IVPB  Status:  Discontinued        2 g 200 mL/hr over 30 Minutes Intravenous Every 24 hours 07/06/20 2134 07/08/20 1306   07/06/20 2145  metroNIDAZOLE (FLAGYL) IVPB 500 mg  Status:  Discontinued        500 mg 100 mL/hr over 60 Minutes Intravenous Every 8 hours 07/06/20 2134 07/08/20 1306   07/06/20 1515  cefTRIAXone (ROCEPHIN) 2 g in sodium chloride 0.9 % 100 mL IVPB  Status:  Discontinued        2 g 200 mL/hr over 30 Minutes Intravenous Every 24 hours 07/06/20 1503 07/06/20 2130   07/06/20 1515  azithromycin (ZITHROMAX) 500 mg in sodium chloride 0.9 % 250 mL IVPB  Status:  Discontinued        500 mg 250 mL/hr over 60 Minutes Intravenous Every 24 hours 07/06/20 1503 07/06/20 2130      PRN meds: sodium chloride, acetaminophen **OR** acetaminophen, albuterol, busPIRone, hydrALAZINE, loperamide, ondansetron **OR** ondansetron (ZOFRAN) IV, zolpidem   Objective: Vitals:   07/09/20 0800 07/09/20 1100  BP: (!) 180/94 (!) 143/96  Pulse: 81   Resp: (!) 21 20  Temp: 98.1 F  (36.7 C) 98.5 F (36.9 C)  SpO2: 95% 93%    Intake/Output Summary (Last 24 hours) at 07/09/2020 1343 Last data filed at 07/09/2020 1200 Gross per 24 hour  Intake 466.82 ml  Output --  Net 466.82 ml   Filed Weights   07/07/20 2112  Weight: 43.7 kg   Weight change:  Body mass index is 17.07 kg/m.   Physical Exam: General exam: Appears calm and comfortable.  Not in distress at the time of my evaluation this morning Skin: No rashes, lesions or ulcers. HEENT: Atraumatic, normocephalic, supple neck, no obvious bleeding Lungs: Clear to auscultation bilaterally CVS: Regular rate and rhythm, no murmur GI/Abd soft, nontender, nondistended, bowel sound present CNS: Alert, awake, oriented x3, bilateral intentional tremor seen Psychiatry: Mood appropriate Extremities: No pedal edema, no calf tenderness  Data Review: I have personally reviewed the laboratory data and studies available.  Recent Labs  Lab 07/06/20 1538 07/06/20 2147  WBC 8.2  --   NEUTROABS 4.9  --   HGB 9.1* 7.8*  HCT 29.5* 23.0*  MCV 89.4  --   PLT 750*  --    Recent Labs  Lab 07/06/20 1538 07/06/20 2147 07/07/20 2133 07/08/20 0112 07/09/20 0126  NA 139 143 138 139 140  K 4.8 4.3 4.8 4.4 4.5  CL 115*  --  103 104 102  CO2 9*  --  21* 23 27  GLUCOSE 171*  --  91 94 129*  BUN 34*  --  22 20 21   CREATININE 2.11*  --  1.75* 1.69* 1.81*  CALCIUM 9.1  --  8.5* 8.4* 8.7*  PHOS  --   --   --  3.0 3.3    F/u labs ordered  Signed, Terrilee Croak, MD [facility] 07/09/2020

## 2020-07-09 NOTE — Evaluation (Signed)
Physical Therapy Evaluation Patient Details Name: Alyssa [name] MRN: [id number] DOB: 03/21/1941 Today's Date: 07/09/2020   History of Present Illness  79 year old female history of CKD stage IV, hypertension, PAD who was admitted with COVID-19 and acute kidney injury last month. ince discharge patient reports progressively worsening shortness of breath.  Today she had shortness of breath, fever chills and rigors EMS was called. Pt found to have respiratory alkalosis and metabolic acidosis  Clinical Impression  Pt presents to PT with deficits in activity tolerance, transfer quality, cognition, and strength/power. Pt does report her ambulation and mobility feel near baseline, citing increased work of breathing with similar ambulation distances at baseline. Pt will benefit from continued acute PT POC and aggressive mobilization during this admission to maintain her current level of function and to reduce falls risk. PT recommends no PT or DME needs at the time of discharge, continued supervision and assistance from caretaker.    Follow Up Recommendations No PT follow up;Supervision - Intermittent    Equipment Recommendations  None recommended by PT    Recommendations for Other Services       Precautions / Restrictions Precautions Precautions: Fall Restrictions Weight Bearing Restrictions: No      Mobility  Bed Mobility Overal bed mobility: Needs Assistance Bed Mobility: Supine to Sit;Sit to Supine     Supine to sit: Supervision Sit to supine: Supervision      Transfers Overall transfer level: Needs assistance Equipment used: 4-wheeled walker Transfers: Sit to/from Stand Sit to Stand: Min assist         General transfer comment: supervision from sit to stand, stand to sit with minA  Ambulation/Gait Ambulation/Gait assistance: Supervision Gait Distance (Feet): 80 Feet Assistive device: 4-wheeled walker Gait Pattern/deviations: Step-through pattern;Trunk  flexed Gait velocity: reduced Gait velocity interpretation: <1.8 ft/sec, indicate of risk for recurrent falls General Gait Details: pt with shortened step through gait, increased trunk flexion and reduced gait speed  Stairs            Wheelchair Mobility    Modified Rankin (Stroke Patients Only)       Balance Overall balance assessment: Needs assistance Sitting-balance support: No upper extremity supported;Feet supported Sitting balance-Leahy Scale: Good     Standing balance support: Bilateral upper extremity supported Standing balance-Leahy Scale: Poor Standing balance comment: reliant on UE support of Rollator                             Pertinent Vitals/Pain Pain Assessment: No/denies pain    Home Living Family/patient expects to be discharged to:: Private residence Living Arrangements: Other (Comment) (caretaker) Available Help at Discharge: Available 24 hours/day Type of Home: House Home Access: Stairs to enter Entrance Stairs-Rails: Right;Left;Can reach both Entrance Stairs-Number of Steps: 3 Home Layout: One level Home Equipment: Walker - 2 wheels;Walker - 4 wheels;Tub bench Additional Comments: 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    Prior Function Level of Independence: Needs assistance   Gait / Transfers Assistance Needed: pt ambulates modI for household and limited community distances with use of 4 wheeled walker  ADL's / Homemaking Assistance Needed: pt is independent with ADLs, caretaker assists with IADLs        Hand Dominance   Dominant Hand: Right    Extremity/Trunk Assessment   Upper Extremity Assessment Upper Extremity Assessment: Overall WFL for tasks assessed    Lower Extremity Assessment Lower Extremity Assessment: Generalized weakness  Cervical / Trunk Assessment Cervical / Trunk Assessment: Kyphotic  Communication   Communication: No difficulties  Cognition Arousal/Alertness:  Awake/alert Behavior During Therapy: WFL for tasks assessed/performed Overall Cognitive Status: No family/caregiver present to determine baseline cognitive functioning                                 General Comments: pt is disoriented to time, appears to answer history questions accurately based on information from prior chart reviews      General Comments General comments (skin integrity, edema, etc.): pt HR into 110s, RR elevated to 42 with ambulation but sats stable. Pt reports increased work of breathing at baseline with ambulation. RR down to 20s after resting in supine for ~3 minutes    Exercises     Assessment/Plan    PT Assessment Patient needs continued PT services  PT Problem List Decreased strength;Decreased activity tolerance;Decreased balance;Decreased mobility;Decreased cognition;Decreased knowledge of use of DME;Decreased safety awareness;Decreased knowledge of precautions;Cardiopulmonary status limiting activity       PT Treatment Interventions DME instruction;Gait training;Stair training;Functional mobility training;Therapeutic activities;Therapeutic exercise;Balance training;Cognitive remediation;Patient/family education    PT Goals (Current goals can be found in the Care Plan section)  Acute Rehab PT Goals Patient Stated Goal: To improve activity tolerance and go home PT Goal Formulation: With patient Time For Goal Achievement: 07/23/20 Potential to Achieve Goals: Good    Frequency Min 3X/week   Barriers to discharge        Co-evaluation               AM-PAC PT "6 Clicks" Mobility  Outcome Measure Help needed turning from your back to your side while in a flat bed without using bedrails?: None Help needed moving from lying on your back to sitting on the side of a flat bed without using bedrails?: None Help needed moving to and from a bed to a chair (including a wheelchair)?: None Help needed standing up from a chair using your arms  (e.g., wheelchair or bedside chair)?: None Help needed to walk in hospital room?: None Help needed climbing 3-5 steps with a railing? : A Little 6 Click Score: 23    End of Session   Activity Tolerance: Patient limited by fatigue Patient left: in bed;with call bell/phone within reach Nurse Communication: Mobility status PT Visit Diagnosis: Other abnormalities of gait and mobility (R26.89);Muscle weakness (generalized) (M62.81)    Time: 9242-6834 PT Time Calculation (min) (ACUTE ONLY): 18 min   Charges:   PT Evaluation $PT Eval Moderate Complexity: 1 Mod          Zenaida Niece, PT, DPT Acute Rehabilitation Pager: [phone]   Zenaida Niece 07/09/2020, 10:10 AM

## 2020-07-09 NOTE — Discharge Summary (Signed)
Physician Discharge Summary  Alyssa [name] ASN:[id number] DOB: [birth year] DOA: 07/06/2020  PCP: Alyssa Moro, DO  Admit date: 07/06/2020 Discharge date: 07/10/2020  Admitted From: Home Discharge disposition: Home   Code Status: Full Code  Diet Recommendation: Cardiac diet  Discharge Diagnosis:   Principal Problem:   Metabolic acidosis Active Problems:   Shortness of breath   PVD (peripheral vascular disease) (HCC)   CKD (chronic kidney disease) stage 4, GFR 15-29 ml/min (HCC)   Anemia   Protein-calorie malnutrition, severe (HCC)   Essential hypertension   Abdominal pain   Elevated INR   History of Present Illness / Brief narrative:  Patient is a 79 year old female history of CKD stage IV, hypertension, PAD who was admitted with COVID-19 and acute kidney injury last month. Since discharge patient has been experiencing progressively worsening shortness of breath.  9/19, she had shortness of breath, fever chills and rigors EMS was called.    EMS noted a temperature of 100.8.  In the ED, patient had a temperature of 99, tachycardic to 120, respiratory 25, blood pressure 160/102 Labs with lactate level of 3.2, serum bicarbonate level 9, anion gap 15 Chest x-ray did not show any acute infiltrates. Blood gas showed normal pH of 7.4, PCO2 low at 19, serum bicarb low at 12 Urinalysis showed large leukocyte esterase, negative nitrates, 6-10 WBCs, negative for blood.  Patient was admitted to hospitalist service for further evaluation management.  Subjective:  Seen and examined this morning. Pleasant elderly [name].  Lying down in bed.  Not in distress. No fever last 48 hours.  Blood pressure in 160s.  Breathing comfortably on room air.  Feels ready to go home today.  Hospital Course:  Acute on chronic metabolic acidosis with respiratory compensation -Patient has chronic metabolic acidosis and was supposed to be on sodium bicarbonate tablet which  apparently she was not compliant to. -She presented with a low bicarbonate level of 9.  Blood gas showed PCO2 level at 19. -Patient probably was hyperventilating to generate respiratory compensation for acute worsening of metabolic acidosis. -Pulmonary embolism was ruled out by a negative VQ scan. -Nephrology consult appreciated.  She was started on bicarbonate drip and subsequently switched to resume sodium bicarbonate tablets 1300 mg twice daily. -Continue to follow-up with nephrology as an outpatient  Sepsis ruled out -Initially suspected of sepsis because of respiratory distress. -Chest x-ray and CT imaging did not show any new infiltrates in lungs. -Sepsis ruled out now.  Antibiotics stopped.  Falls/tremors -Patient reports tremors and intermittent falls at home. -9/22, MRI brain was obtained which did not show any evidence of acute intracranial abnormality.  Chronic microvascular ischemic changes were noted. -PT evaluation was obtained.  Patient is functioning as ambulating independently. -Previous hospitalist switched her from Coreg to propanolol which we will continue at discharge.  Hypertension -Currently on clonidine, amlodipine and propanolol.  Blood pressure remains elevated to 160s today.  I would increase amlodipine from 2.5 mg to 5 mg daily at discharge.  CKD stage IV -Creatinine fluctuating but remains at baseline. -Follow-up with nephrology as an outpatient. 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 -Continue iron supplement at home. Recent Labs    06/03/20 0413  06/03/20 1421 06/04/20 0251 06/05/20 0337 06/06/20 0628 06/07/20 0651 06/09/20 1604 07/06/20 1538 07/06/20 2147  07/10/20 0137  HGB 8.6* 8.2* 7.6* 7.1* 6.7* 9.2* 9.6* 9.1* 7.8* 8.5*   COVID-19 pneumonia last month -No residual symptoms.  Stable for discharge to home today.  Wound care:    Discharge Exam:   Vitals:   07/09/20 2311 07/10/20 0323 07/10/20 0700 07/10/20 0945  BP: (!) 163/101 (!) 167/99 (!) 157/100 (!) 183/102  Pulse: 77 78 82 79  Resp: 20 (!) 22 (!) 22   Temp: 99.2 F (37.3 C) 98.6 F (37 C) (!) 97.4 F (36.3 C)   TempSrc: Oral Oral Oral   SpO2: 96% 94% 93%   Weight:      Height:        Body mass index is 17.07 kg/m.  General exam: Appears calm and comfortable.  Not in distress.  No new symptoms Skin: No rashes, lesions or ulcers. HEENT: Atraumatic, normocephalic, supple neck, no obvious bleeding Lungs: Clear to auscultation bilaterally CVS: Regular rate and rhythm, no murmur GI/Abd soft, nontender, nondistended, bowel sound present CNS: Alert, awake, oriented x3 Psychiatry: Mood appropriate Extremities: No pedal edema, no calf tenderness  Follow ups:   Discharge Instructions    Diet - low sodium heart healthy   Complete by: As directed    Increase activity slowly   Complete by: As directed       Follow-up Information    Alyssa Moro, DO Follow up.   Specialty: General Practice Contact information: [place] [address] [phone]        Rexene Agent, MD Follow up.   Specialty: Nephrology Contact information: [place] [address] [phone]               Recommendations for Outpatient Follow-Up:   1. Follow-up with PCP as an outpatient 2. Follow-up with nephrology as an outpatient  Discharge Instructions:  Follow with Primary MD Alyssa Moro, DO in 7 days   Get CBC/BMP checked in next visit within 1 week by PCP or SNF MD ( we routinely change or add medications that can affect your baseline labs and fluid status, therefore we recommend that you get the mentioned basic workup next visit with your  PCP, your PCP may decide not to get them or add new tests based on their clinical decision)  On your next visit with your PCP, please Get Medicines reviewed and adjusted.  Please request your PCP  to go over all Hospital Tests and Procedure/Radiological results at the follow up, please get all Hospital records sent to your Prim MD by signing hospital release before you go home.  Activity: As tolerated with Full fall precautions use walker/cane & assistance as needed  For Heart failure patients - Check your Weight same time everyday, if you gain over 2 pounds, or you develop in leg swelling, experience more shortness of breath or chest pain, call your Primary MD immediately. Follow Cardiac Low Salt Diet and 1.5 lit/day fluid restriction.  If you have smoked or chewed Tobacco in the last 2 yrs please stop smoking, stop any regular Alcohol  and or any Recreational drug use.  If you experience worsening of your admission symptoms, develop shortness of breath, life threatening emergency, suicidal or homicidal thoughts you must seek medical attention immediately by calling 911 or calling your MD immediately  if symptoms less severe.  You Must read complete instructions/literature along with all the possible adverse reactions/side effects for  all the Medicines you take and that have been prescribed to you. Take any new Medicines after you have completely understood and accpet all the possible adverse reactions/side effects.   Do not drive, operate heavy machinery, perform activities at heights, swimming or participation in water activities or provide baby sitting services if your were admitted for syncope or siezures until you have seen by Primary MD or a Neurologist and advised to do so again.  Do not drive when taking Pain medications.  Do not take more than prescribed Pain, Sleep and Anxiety Medications  Wear Seat belts while driving.   Please note You were cared for by a hospitalist during your  hospital stay. If you have any questions about your discharge medications or the care you received while you were in the hospital after you are discharged, you can call the unit and asked to speak with the hospitalist on call if the hospitalist that took care of you is not available. Once you are discharged, your primary care physician will handle any further medical issues. Please note that NO REFILLS for any discharge medications will be authorized once you are discharged, as it is imperative that you return to your primary care physician (or establish a relationship with a primary care physician if you do not have one) for your aftercare needs so that they can reassess your need for medications and monitor your lab values.    Allergies as of 07/10/2020   No Known Allergies     Medication List    STOP taking these medications   carvedilol 12.5 MG tablet Commonly known as: COREG     TAKE these medications   acetaminophen 325 MG tablet Commonly known as: TYLENOL Take 2 tablets (650 mg total) by mouth every 6 (six) hours as needed for mild pain, moderate pain or fever (or Fever >/= 101).   amLODipine 5 MG tablet Commonly known as: NORVASC Take 1 tablet (5 mg total) by mouth daily. Start taking on: July 11, 2020 What changed:   medication strength  how much to take  Another medication with the same name was removed. Continue taking this medication, and follow the directions you see here.   aspirin 81 MG EC tablet Take 1 tablet (81 mg total) by mouth daily.   busPIRone 30 MG tablet Commonly known as: BUSPAR Take 45 mg by mouth daily.   cloNIDine 0.2 MG tablet Commonly known as: CATAPRES Take 0.2 mg by mouth at bedtime.   clopidogrel 75 MG tablet Commonly known as: PLAVIX Take 1 tablet (75 mg total) by mouth daily.   diclofenac Sodium 1 % Gel Commonly known as: VOLTAREN Apply 2 g topically 4 (four) times daily as needed (pain).   dicyclomine 20 MG tablet Commonly  known as: BENTYL Take 20 mg by mouth 3 (three) times daily.   donepezil 5 MG tablet Commonly known as: ARICEPT Take 5 mg by mouth daily.   DULoxetine 30 MG capsule Commonly known as: CYMBALTA Take 30 mg by mouth daily.   feeding supplement (ENSURE ENLIVE) Liqd Take 237 mLs by mouth 2 (two) times daily between meals.   (feeding supplement) PROSource Plus liquid Take 30 mLs by mouth 2 (two) times daily between meals.   gabapentin 100 MG capsule Commonly known as: NEURONTIN Take 100 mg by mouth 3 (three) times daily.   hydrOXYzine 25 MG tablet Commonly known as: ATARAX/VISTARIL Take 25 mg by mouth at bedtime as needed (sleep).   Iron High-Potency 325 MG Tabs Take  325 mg by mouth daily with breakfast.   magnesium oxide 400 MG tablet Commonly known as: MAG-OX Take 400 mg by mouth daily.   megestrol 40 MG tablet Commonly known as: MEGACE Take 40 mg by mouth 2 (two) times daily.   methocarbamol 500 MG tablet Commonly known as: ROBAXIN Take 500 mg by mouth 3 (three) times daily as needed (back spasms).   multivitamin with minerals Tabs tablet Take 1 tablet by mouth daily.   nitroGLYCERIN 0.4 MG SL tablet Commonly known as: NITROSTAT Place 1 tablet (0.4 mg total) under the tongue every 5 (five) minutes as needed for chest pain.   ondansetron 4 MG disintegrating tablet Commonly known as: Zofran ODT Take 1 tablet (4 mg total) by mouth every 4 (four) hours as needed for nausea or vomiting.   pantoprazole 40 MG tablet Commonly known as: PROTONIX Take 1 tablet (40 mg total) by mouth daily.   Poly-Iron 150 Forte 150-0.025-1 MG Caps Generic drug: Iron Polysacch [id number] Take 1 capsule by mouth daily.   ProAir HFA 108 (90 Base) MCG/ACT inhaler Generic drug: albuterol Inhale 2 puffs into the lungs every 4 (four) hours as needed for wheezing or shortness of breath.   propranolol 40 MG tablet Commonly known as: INDERAL Take 1 tablet (40 mg total) by mouth 2 (two)  times daily.   rosuvastatin 40 MG tablet Commonly known as: CRESTOR Take 1 tablet (40 mg total) by mouth at bedtime. What changed: when to take this   sodium bicarbonate 650 MG tablet Take 2 tablets (1,300 mg total) by mouth 2 (two) times daily.   valproic acid 250 MG capsule Commonly known as: DEPAKENE Take 250 mg by mouth at bedtime.   Vitamin D (Ergocalciferol) 1.25 MG (50000 UNIT) Caps capsule Commonly known as: DRISDOL Take 50,000 Units by mouth every 7 (seven) days. mondays       Time coordinating discharge: 35 minutes  The results of significant diagnostics from this hospitalization (including imaging, microbiology, ancillary and laboratory) are listed below for reference.    Procedures and Diagnostic Studies:   CT ABDOMEN PELVIS WO CONTRAST  Result Date: 07/06/2020 CLINICAL DATA:  COVID-19 positivity, abdominal pain with rebound tenderness EXAM: CT ABDOMEN AND PELVIS WITHOUT CONTRAST TECHNIQUE: Multidetector CT imaging of the abdomen and pelvis was performed following the standard protocol without IV contrast. COMPARISON:  06/02/2020 FINDINGS: Lower chest: Very mild patchy opacities are noted consistent with the given clinical history. No sizable effusion is seen. Hiatal hernia is noted and stable. Hepatobiliary: No focal liver abnormality is seen. No gallstones, gallbladder wall thickening, or biliary dilatation. Pancreas: Unremarkable. No pancreatic ductal dilatation or surrounding inflammatory changes. Spleen: Normal in size without focal abnormality. Adrenals/Urinary Tract: Adrenal glands are within normal limits. Renal vascular calcifications are noted bilaterally. The right kidney is somewhat shrunken compared to the left but stable in appearance from the prior study. No obstructive changes are seen. The bladder is well distended. Stomach/Bowel: Scattered diverticular change of the colon is noted without evidence of diverticulitis. The appendix is within normal limits.  Small bowel and stomach are within normal limits aside from the previously described hiatal hernia. Vascular/Lymphatic: Diffuse atherosclerotic changes are noted. No significant lymphadenopathy is seen. 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 The overall appearance is stable from the prior exam. Reproductive: Uterus and bilateral adnexa are unremarkable. Other: No abdominal wall hernia or abnormality. No abdominopelvic ascites. Musculoskeletal: Postsurgical changes are noted in the  right hip as well as the lumbar spine stable in appearance from the prior exam. No acute fracture is seen. IMPRESSION: Chronic changes stable in appearance from the prior CT examination. No acute abnormality to correspond with the patient's physical exam is identified. Electronically Signed   By: Inez Catalina M.D.   On: 07/06/2020 22:44   NM Pulmonary Perfusion  Result Date: 07/07/2020 CLINICAL DATA:  Shortness of breath EXAM: NUCLEAR MEDICINE PERFUSION LUNG SCAN TECHNIQUE: Perfusion images were obtained in multiple projections after intravenous injection of radiopharmaceutical. Ventilation scans intentionally deferred if perfusion scan and chest x-ray adequate for interpretation during COVID 19 epidemic. RADIOPHARMACEUTICALS:  4.1 mCi Tc-76m MAA IV COMPARISON:  07/06/2020 FINDINGS: Perfusion images demonstrate adequate uptake throughout both lungs. No definitive which shaped defect is identified to suggest pulmonary embolism. IMPRESSION: No evidence of pulmonary embolism. Electronically Signed   By: Inez Catalina M.D.   On: 07/07/2020 21:18   DG Chest Port 1 View  Result Date: 07/06/2020 CLINICAL DATA:  Sepsis. Patient had positive COVID-19 test 2 months ago. Shortness of breath and cough. EXAM: PORTABLE CHEST 1 VIEW COMPARISON:  June 09, 2020 FINDINGS: The heart, hila, and mediastinum are normal. No pneumothorax. No nodules or masses. No focal  infiltrates. IMPRESSION: No active disease. Electronically Signed   By: Dorise Bullion III M.D   On: 07/06/2020 16:18     Labs:   Basic Metabolic Panel: Recent Labs  Lab 07/06/20 1538 07/06/20 1538 07/06/20 2147 07/06/20 2147 07/07/20 2133 07/07/20 2133 07/08/20 0112 07/08/20 0112 07/09/20 0126 07/10/20 0137  NA 139   < > 143  --  138  --  139  --  140 139  K 4.8   < > 4.3   < > 4.8   < > 4.4   < > 4.5 4.8  CL 115*  --   --   --  103  --  104  --  102 102  CO2 9*  --   --   --  21*  --  23  --  27 25  GLUCOSE 171*  --   --   --  91  --  94  --  129* 129*  BUN 34*  --   --   --  22  --  20  --  21 21  CREATININE 2.11*  --   --   --  1.75*  --  1.69*  --  1.81* 1.86*  CALCIUM 9.1  --   --   --  8.5*  --  8.4*  --  8.7* 9.0  PHOS  --   --   --   --   --   --  3.0  --  3.3 3.9   < > = values in this interval not displayed.   GFR Estimated Creatinine Clearance: 17.8 mL/min (A) (by C-G formula based on SCr of 1.86 mg/dL (H)). Liver Function Tests: Recent Labs  Lab 07/06/20 1538 07/08/20 0112 07/09/20 0126 07/10/20 0137  AST 26  --   --   --   ALT 16  --   --   --   ALKPHOS 101  --   --   --   BILITOT 0.4  --   --   --   PROT 7.0  --   --   --   ALBUMIN 2.8* 2.3* 2.2* 2.2*   No results for input(s): LIPASE, AMYLASE in the last 168 hours. No results for input(s): AMMONIA in  the last 168 hours. Coagulation profile Recent Labs  Lab 07/06/20 1538 07/06/20 2246  INR 2.2* 1.2    CBC: Recent Labs  Lab 07/06/20 1538 07/06/20 2147 07/10/20 0137  WBC 8.2  --  7.7  NEUTROABS 4.9  --  4.1  HGB 9.1* 7.8* 8.5*  HCT 29.5* 23.0* 26.7*  MCV 89.4  --  84.5  PLT 750*  --  619*   Cardiac Enzymes: No results for input(s): CKTOTAL, CKMB, CKMBINDEX, TROPONINI in the last 168 hours. BNP: Invalid input(s): POCBNP CBG: No results for input(s): GLUCAP in the last 168 hours. D-Dimer No results for input(s): DDIMER in the last 72 hours. Hgb A1c No results for input(s):  HGBA1C in the last 72 hours. Lipid Profile No results for input(s): CHOL, HDL, LDLCALC, TRIG, CHOLHDL, LDLDIRECT in the last 72 hours. Thyroid function studies No results for input(s): TSH, T4TOTAL, T3FREE, THYROIDAB in the last 72 hours.  Invalid input(s): FREET3 Anemia work up Recent Labs    07/08/20 1230  FERRITIN 1,391*  TIBC 197*  IRON 32   Microbiology Recent Results (from the past 240 hour(s))  Culture, blood (x 2)     Status: None (Preliminary result)   Collection Time: 07/06/20  3:02 PM   Specimen: BLOOD  Result Value Ref Range Status   Specimen Description BLOOD RIGHT ANTECUBITAL  Final   Special Requests   Final    BOTTLES DRAWN AEROBIC AND ANAEROBIC Blood Culture results may not be optimal due to an inadequate volume of blood received in culture bottles   Culture   Final    NO GROWTH 3 DAYS Performed at [facility], [place] [address], [place], [place] [address]    Report Status PENDING  Incomplete  Culture, Urine     Status: Abnormal   Collection Time: 07/06/20  6:56 PM   Specimen: Urine, Random  Result Value Ref Range Status   Specimen Description URINE, RANDOM  Final   Special Requests   Final    NONE Performed at [facility], [place] [address] [place], [place] [address]    Culture MULTIPLE SPECIES PRESENT, SUGGEST RECOLLECTION (A)  Final   Report Status 07/07/2020 FINAL  Final  MRSA PCR Screening     Status: None   Collection Time: 07/07/20 10:15 PM   Specimen: Nasopharyngeal  Result Value Ref Range Status   MRSA by PCR NEGATIVE NEGATIVE Final    Comment:        The GeneXpert MRSA Assay (FDA approved for NASAL specimens only), is one component of a comprehensive MRSA colonization surveillance program. It is not intended to diagnose MRSA infection nor to guide or monitor treatment for MRSA infections. Performed at [facility], [place] [address] [place], [place] [address]      Signed: Terrilee Croak  [facility] 07/10/2020,  11:10 AM

## 2020-07-09 NOTE — Progress Notes (Signed)
BP was high in the morning 180/94 and administered prn hydralazine. It didn't work, but administered morning BP medications then down to 143/96. Patient was c/o doesn't feel good (clammy), back and headache around 1100, but patient already had tylenol in the morning and  Robaxin 500 mg TID. Patient had diarrhea with belly clamps. V/S was stable, no temp. Made room temp down and turn on the fan. Now patient is okay. HS [employer]

## 2020-07-09 NOTE — Plan of Care (Signed)

## 2020-07-10 LAB — CBC WITH DIFFERENTIAL/PLATELET
Abs Immature Granulocytes: 0.09 10*3/uL — ABNORMAL HIGH (ref 0.00–0.07)
Basophils Absolute: 0.1 10*3/uL (ref 0.0–0.1)
Basophils Relative: 1 %
Eosinophils Absolute: 0.4 10*3/uL (ref 0.0–0.5)
Eosinophils Relative: 5 %
HCT: 26.7 % — ABNORMAL LOW (ref 36.0–46.0)
Hemoglobin: 8.5 g/dL — ABNORMAL LOW (ref 12.0–15.0)
Immature Granulocytes: 1 %
Lymphocytes Relative: 23 %
Lymphs Abs: 1.8 10*3/uL (ref 0.7–4.0)
MCH: 26.9 pg (ref 26.0–34.0)
MCHC: 31.8 g/dL (ref 30.0–36.0)
MCV: 84.5 fL (ref 80.0–100.0)
Monocytes Absolute: 1.2 10*3/uL — ABNORMAL HIGH (ref 0.1–1.0)
Monocytes Relative: 16 %
Neutro Abs: 4.1 10*3/uL (ref 1.7–7.7)
Neutrophils Relative %: 54 %
Platelets: 619 10*3/uL — ABNORMAL HIGH (ref 150–400)
RBC: 3.16 MIL/uL — ABNORMAL LOW (ref 3.87–5.11)
RDW: 16.4 % — ABNORMAL HIGH (ref 11.5–15.5)
WBC: 7.7 10*3/uL (ref 4.0–10.5)
nRBC: 0 % (ref 0.0–0.2)

## 2020-07-10 LAB — RENAL FUNCTION PANEL
Albumin: 2.2 g/dL — ABNORMAL LOW (ref 3.5–5.0)
Anion gap: 12 (ref 5–15)
BUN: 21 mg/dL (ref 8–23)
CO2: 25 mmol/L (ref 22–32)
Calcium: 9 mg/dL (ref 8.9–10.3)
Chloride: 102 mmol/L (ref 98–111)
Creatinine, Ser: 1.86 mg/dL — ABNORMAL HIGH (ref 0.44–1.00)
GFR calc Af Amer: 30 mL/min — ABNORMAL LOW (ref >60)
GFR calc non Af Amer: 26 mL/min — ABNORMAL LOW (ref >60)
Glucose, Bld: 129 mg/dL — ABNORMAL HIGH (ref 70–99)
Phosphorus: 3.9 mg/dL (ref 2.5–4.6)
Potassium: 4.8 mmol/L (ref 3.5–5.1)
Sodium: 139 mmol/L (ref 135–145)

## 2020-07-10 MED ORDER — PROSOURCE PLUS PO LIQD: 30.0000 mL | Freq: Two times a day (BID) | ORAL | 0 refills | Status: AC

## 2020-07-10 MED ORDER — AMLODIPINE BESYLATE 5 MG PO TABS: 5.0000 mg | ORAL_TABLET | Freq: Every day | ORAL | 0 refills | Status: DC

## 2020-07-10 MED ORDER — PROPRANOLOL HCL 40 MG PO TABS: 40.0000 mg | ORAL_TABLET | Freq: Two times a day (BID) | ORAL | 0 refills | Status: DC

## 2020-07-10 MED ORDER — AMLODIPINE BESYLATE 5 MG PO TABS
5.0000 mg | ORAL_TABLET | Freq: Every day | ORAL | Status: DC
  Administered 2020-07-10: 5 mg via ORAL
  Filled 2020-07-10: qty 1

## 2020-07-10 NOTE — TOC Initial Note (Signed)
Transition of Care [facility]) - Initial/Assessment Note    Patient Details  Name: Alyssa [name] MRN: [id number] Date of Birth: 05-09-1941  Transition of Care [facility]) CM/SW Contact:    Zenon Mayo, RN Phone Number: 07/10/2020, 1:23 PM  Clinical Narrative:                 Patient for dc home today, per pt eval no pt f/u needed. She has no needs.  Expected Discharge Plan: Home/Self Care Barriers to Discharge: No Barriers Identified   Patient Goals and CMS Choice     Choice offered to / list presented to : NA  Expected Discharge Plan and Services Expected Discharge Plan: Home/Self Care   Discharge Planning Services: CM Consult   Living arrangements for the past 2 months: Single Family Home Expected Discharge Date: 07/10/20                 DME Agency: NA                  Prior Living Arrangements/Services Living arrangements for the past 2 months: Single Family Home Lives with:: Other (Comment)          Need for Family Participation in Patient Care: No (Comment) Care giver support system in place?: Yes (comment)   Criminal Activity/Legal Involvement Pertinent to Current Situation/Hospitalization: No - Comment as needed  Activities of Daily Living      Permission Sought/Granted                  Emotional Assessment       Orientation: : Oriented to Self, Oriented to Place, Oriented to  Time, Oriented to Situation Alcohol / Substance Use: Not Applicable Psych Involvement: No (comment)  Admission diagnosis:  Metabolic acidosis [T06.2] Sepsis ([place]) [A41.9] Patient Active Problem List   Diagnosis Date Noted  . Shortness of breath 07/06/2020  . Essential hypertension 07/06/2020  . Abdominal pain 07/06/2020  . Elevated INR 07/06/2020  . Acute respiratory failure due to COVID-19 ([place]) 06/02/2020  . Acute renal failure superimposed on chronic kidney disease ([place]) 02/06/2020  . Leukocytosis 02/06/2020  . Thrombocytosis ([place]) 02/06/2020  .  Palliative care encounter   . Constipation 01/05/2020  . Protein-calorie malnutrition, severe ([place]) 01/04/2020  . Acute on chronic renal failure ([place]) 01/04/2020  . Hyperkalemia 01/03/2020  . AKI (acute kidney injury) ([place]) 01/03/2020  . Acute respiratory failure with hypoxia ([place]) 11/01/2019  . UTI (urinary tract infection) 11/01/2019  . CAP (community acquired pneumonia) 11/01/2019  . Acute cystitis without hematuria   . Encephalopathy 09/18/2019  . SAH (subarachnoid hemorrhage) ([place]) 09/18/2019  . Dementia ([place]) 09/18/2019  . Metabolic acidosis 69/48/5462  . ARF (acute renal failure) ([place]) 09/17/2019  . Acute encephalopathy 09/17/2019  . Abnormal CT scan, colon   . Diarrhea   . Diverticulitis 06/07/2019  . Depression with anxiety 06/07/2019  . Volume overload 03/23/2019  . Edema 03/23/2019  . Acute renal failure (ARF) ([place]) 03/08/2019  . Hypokalemia 03/08/2019  . Palliative care by specialist   . Goals of care, counseling/discussion   . Altered mental status   . Encephalopathy, toxic 12/24/2018  . Aspiration pneumonia of both lower lobes due to gastric secretions ([place]) 12/24/2018  . Non-ST elevation (NSTEMI) myocardial infarction ([place])   . Chest pain   . Dyspnea on exertion   . Elevated troponin level 12/09/2018  . Chronic migraine 03/27/2018  . Lumbar radiculopathy 03/27/2018  . Gait abnormality 03/27/2018  . CKD (chronic kidney disease)  stage 4, GFR 15-29 ml/min (H[facility]) 12/28/2016  . Anemia 12/28/2016  . Chronic pain syndrome 12/28/2016  . Benzodiazepine withdrawal without complication ([place]) 68/12/2120  . Chronic right shoulder pain 12/06/2016  . Fall 09/17/2016  . Fracture of humeral head, closed, right, initial encounter 09/17/2016  . Rib fractures 09/17/2016  . Multiple falls 09/17/2016  . Severe muscle deconditioning 09/17/2016  . Failure to thrive in adult 09/17/2016  . Melena 04/25/2016  . Transient hypotension 04/25/2016  . Anxiety state 04/25/2016  .  Tobacco abuse 04/25/2016  . Hyperlipidemia 04/25/2016  . Syncope 04/24/2016  . Near syncope 04/24/2016  . Gait difficulty 01/12/2016  . Foot drop, left 01/12/2016  . Severe recurrent major depression without psychotic features ([place]) 08/28/2015  . Spondylolisthesis at L4-L5 level 07/30/2015  . PVD (peripheral vascular disease) ([place]) 07/29/2014  . Weakness-Bilateral arm/leg 07/29/2014  . Numbness-left leg 07/29/2014  . Swelling of limb-Legs 07/29/2014  . Atherosclerosis of native arteries of the extremities with intermittent claudication 09/30/2011   PCP:  [name], DO Pharmacy:   [facility] [place], [place] - [address] [place] [address] Phone: [phone] Fax: [place], [place] [address] [place] [address] Phone: [phone] Fax: [phone]     Social Determinants of Health (SDOH) Interventions    Readmission Risk Interventions Readmission Risk Prevention Plan 07/10/2020 12/26/2018  Transportation Screening Complete Complete  Medication Review (RN Care Manager) Complete Complete  PCP or Specialist appointment within 3-5 days of discharge - Not Complete  PCP/Specialist Appt Not Complete comments - pt for snf. Snf MD to see as needed.  [place] or Home Care Consult Complete Not Complete  HRI or Home Care Consult Pt Refusal Comments - NA  SW Recovery Care/Counseling Consult Complete Not Complete  SW Consult Not Complete Comments - NA  Palliative Care Screening Not Applicable Complete  Skilled Nursing Facility Not Applicable Complete  Some recent data might be hidden

## 2020-07-10 NOTE — TOC Transition Note (Signed)
Transition of Care [facility]) - CM/SW Discharge Note   Patient Details  Name: Alyssa [name] MRN: [id number] Date of Birth: 05/23/1941  Transition of Care [facility]) CM/SW Contact:  Zenon Mayo, RN Phone Number: 07/10/2020, 1:23 PM   Clinical Narrative:    For dc home today, has no needs   Final next level of care: Home/Self Care Barriers to Discharge: No Barriers Identified   Patient Goals and CMS Choice     Choice offered to / list presented to : NA  Discharge Placement                       Discharge Plan and Services   Discharge Planning Services: CM Consult              DME Agency: NA                  Social Determinants of Health (SDOH) Interventions     Readmission Risk Interventions Readmission Risk Prevention Plan 07/10/2020 12/26/2018  Transportation Screening Complete Complete  Medication Review [employer]) Complete Complete  PCP or Specialist appointment within 3-5 days of discharge - Not Complete  PCP/Specialist Appt Not Complete comments - pt for snf. Snf MD to see as needed.  [place] or Home Care Consult Complete Not Complete  HRI or Home Care Consult Pt Refusal Comments - NA  SW Recovery Care/Counseling Consult Complete Not Complete  SW Consult Not Complete Comments - NA  Palliative Care Screening Not Applicable Complete  Skilled Nursing Facility Not Applicable Complete  Some recent data might be hidden

## 2020-07-12 LAB — CULTURE, BLOOD (ROUTINE X 2): Culture: NO GROWTH

## 2020-07-15 IMAGING — CT CT ABDOMEN AND PELVIS WITHOUT CONTRAST
2 of 5 series · 16 of 46 positions shown, 18 images · non-contrast
Comparison: 03/09/2019

CLINICAL DATA: Left-sided abdominal pain for 2 days

EXAM:
CT ABDOMEN AND PELVIS WITHOUT CONTRAST
TECHNIQUE: Multidetector CT imaging of the abdomen and pelvis was performed
following the standard protocol without IV contrast.

[Series 3: a/p w/o 5mm · axial · non-contrast · 0.75mm/px · z∈[+746,+1101]mm · 13 of 79 slices shown, 15 images]
[im 4/79  soft-tissue]
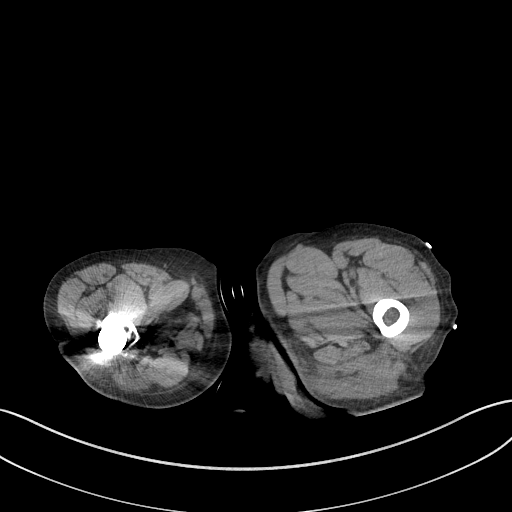
[im 4/79  bone]
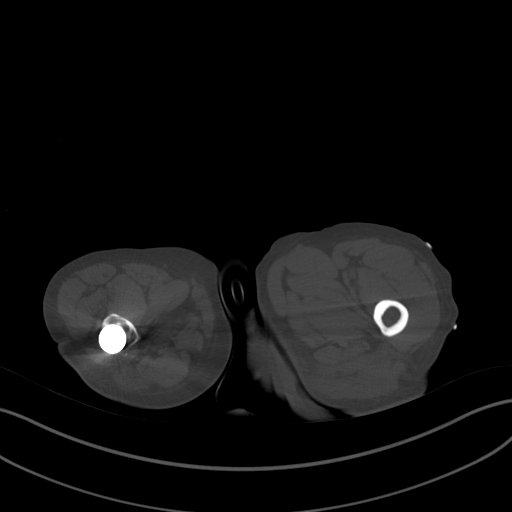
[im 11/79  soft-tissue]
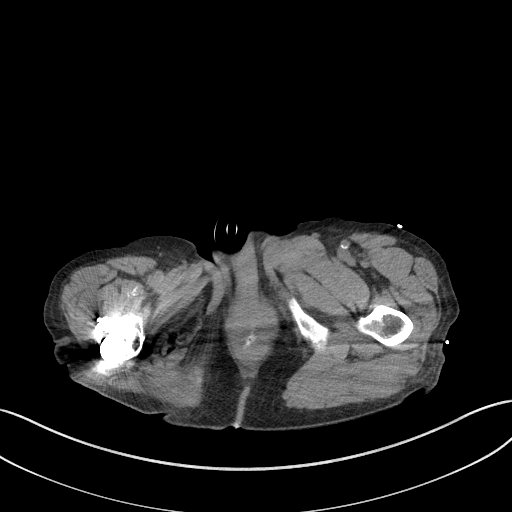
[im 17/79  soft-tissue]
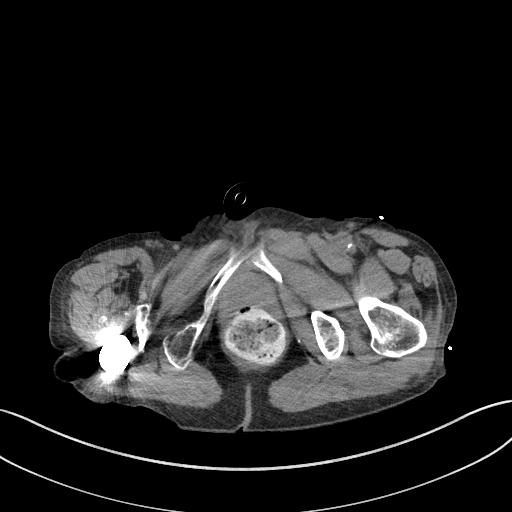
[im 21/79  soft-tissue]
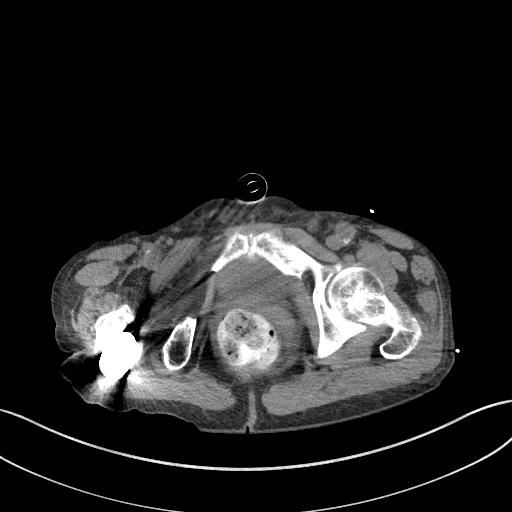
[im 28/79  soft-tissue]
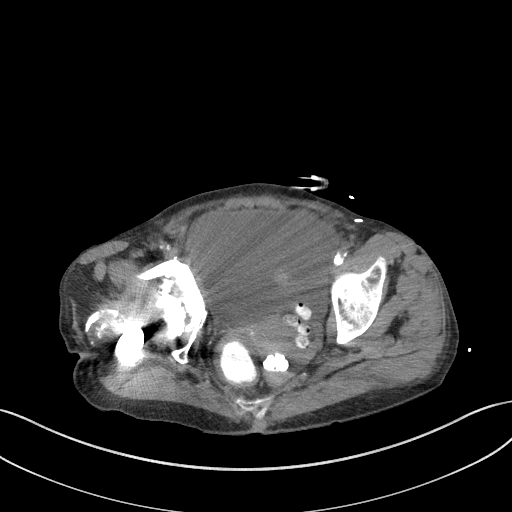
[im 34/79  soft-tissue]
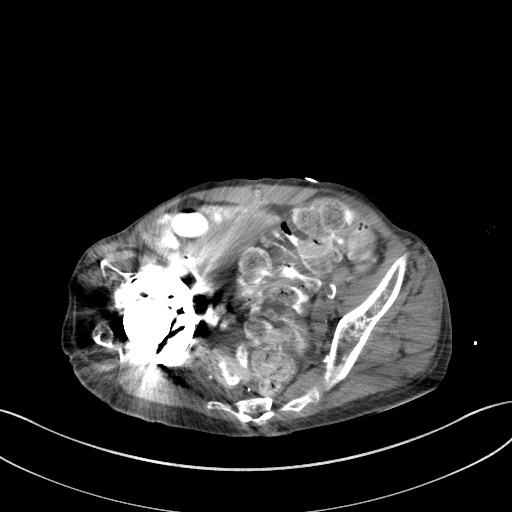
[im 41/79  soft-tissue]
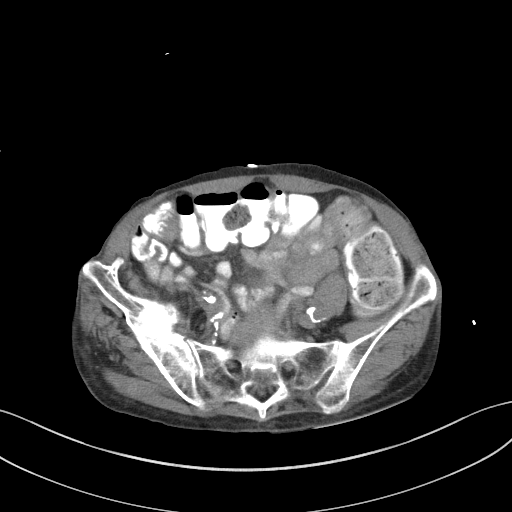
[im 45/79  soft-tissue]
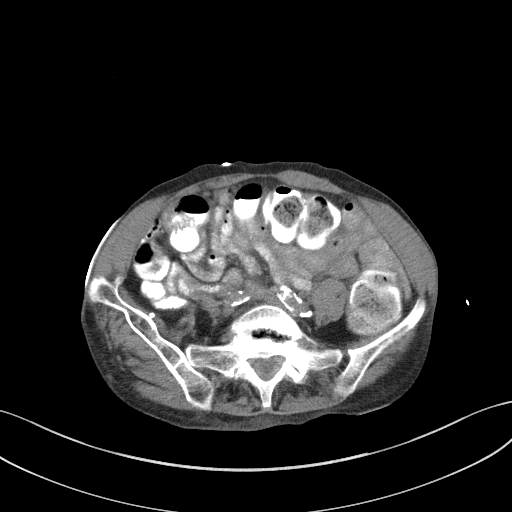
[im 51/79  soft-tissue]
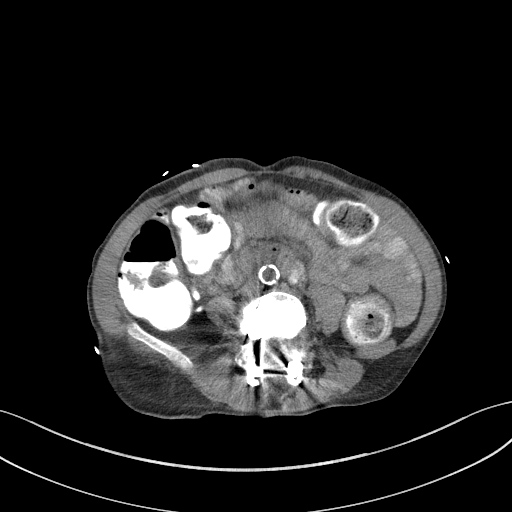
[im 51/79  bone]
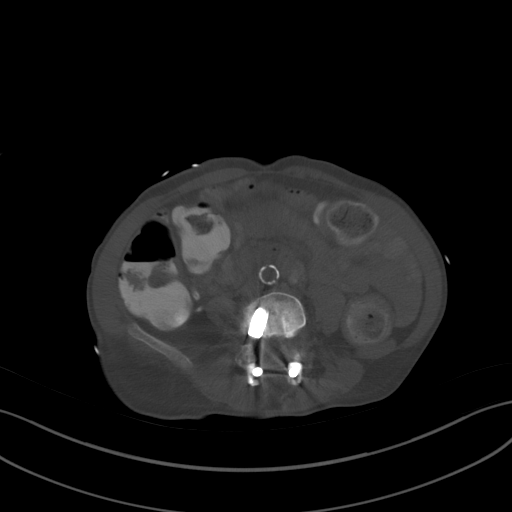
[im 58/79  soft-tissue]
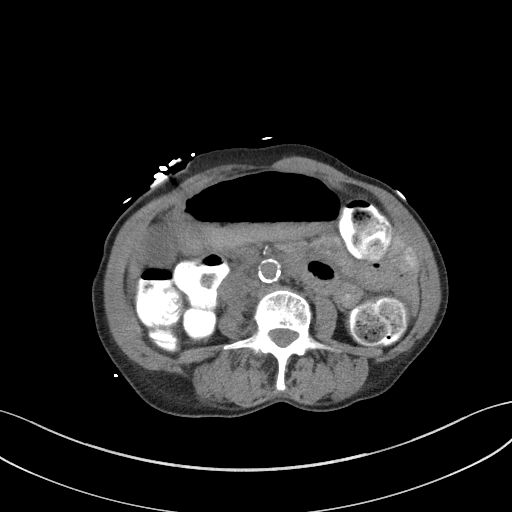
[im 62/79  soft-tissue]
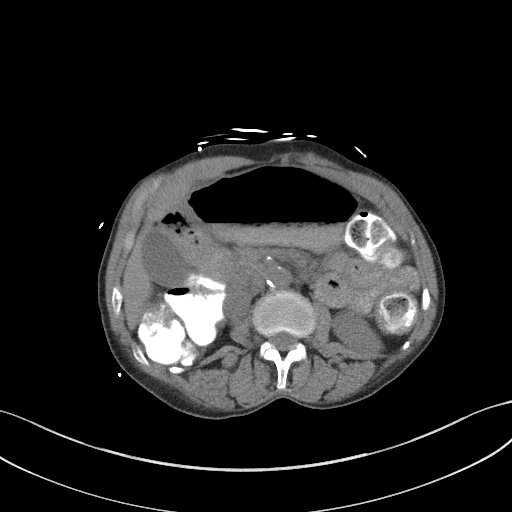
[im 68/79  soft-tissue]
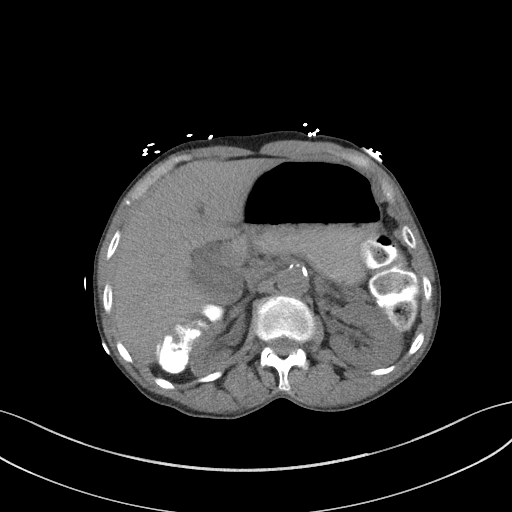
[im 75/79  soft-tissue]
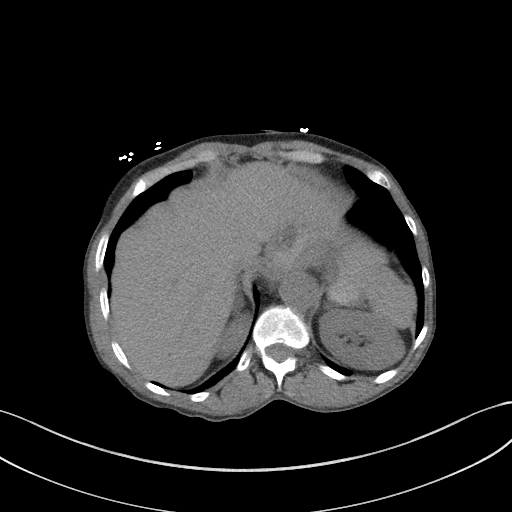

[Series 6: a/p w/o cor · coronal · non-contrast · 0.70mm/px · 3 of 122 slices shown]
[im 41/122  soft-tissue]
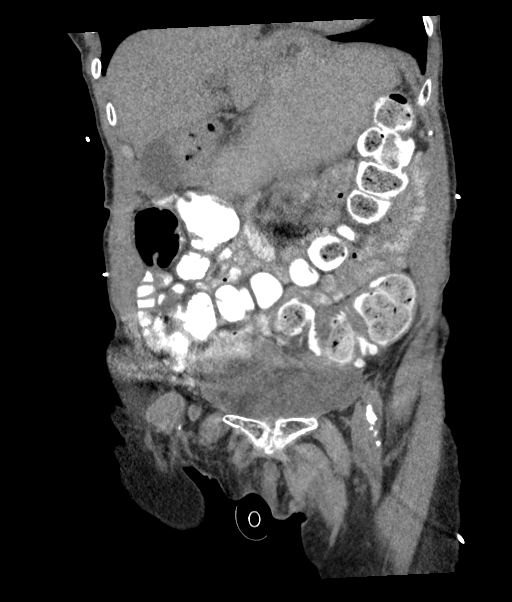
[im 54/122  soft-tissue]
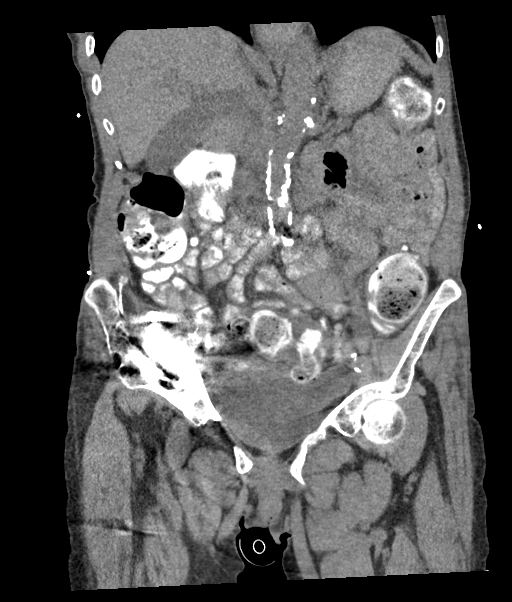
[im 68/122  soft-tissue]
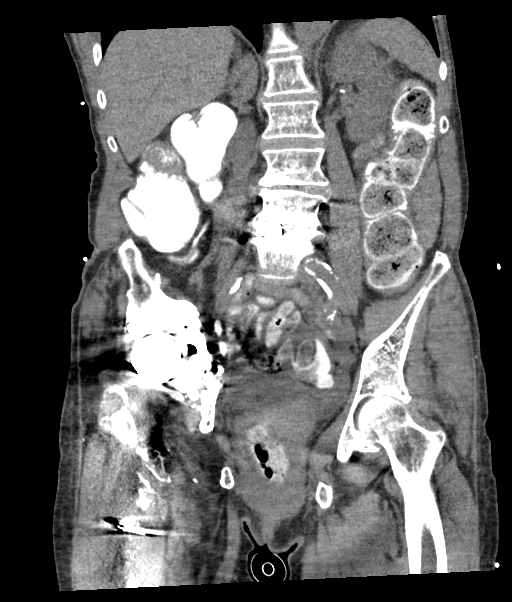

[16 of 46 positions shown; findings below may reference images not displayed]

FINDINGS: Lower chest: Lung bases are free of acute infiltrate or sizable
effusion.

Hepatobiliary: The liver and gallbladder are within normal limits.

Pancreas: Pancreatic atrophy is identified

Spleen: Normal in size without focal abnormality.

Adrenals/Urinary Tract: Adrenal glands are within normal limits. No
renal calculi or obstructive changes are seen. The bladder is well
distended.

Stomach/Bowel: Contrast is noted throughout the colon. Changes of
diverticulosis are seen. Wall thickening of the descending and
sigmoid colon is seen without definitive perforation or abscess
formation. The appendix is unremarkable. Small bowel shows no
obstructive changes. Stomach shows evidence of a hiatal hernia. Some
distal esophageal thickening is noted as well which may be related
to reflux. Clinical correlation is recommended.

Vascular/Lymphatic: Aortic atherosclerosis. No enlarged abdominal or
pelvic lymph nodes.

Reproductive: Uterus and bilateral adnexa are unremarkable.

Other: No abdominal wall hernia or abnormality. No abdominopelvic
ascites.

Musculoskeletal: Changes of prior right hip replacement are seen.
Degenerative changes of lumbar spine are noted. Changes of prior
fusion at L4-5 are seen.
IMPRESSION: Distal colonic wall thickening consistent with mild colitis. No
perforation or abscess is identified.

Hiatal hernia with distal esophageal thickening likely related to
reflux. Clinical correlation is recommended.

Chronic changes as described above.

## 2020-08-14 ENCOUNTER — Ambulatory Visit: Payer: Medicare HMO | Admitting: Cardiovascular Disease

## 2020-09-08 ENCOUNTER — Other Ambulatory Visit: Payer: Medicare HMO

## 2020-09-09 ENCOUNTER — Ambulatory Visit: Payer: Medicare HMO

## 2020-09-09 ENCOUNTER — Other Ambulatory Visit: Payer: Self-pay

## 2020-09-09 DIAGNOSIS — I6523 Occlusion and stenosis of bilateral carotid arteries: Secondary | ICD-10-CM

## 2020-09-12 ENCOUNTER — Other Ambulatory Visit: Payer: Self-pay | Admitting: Cardiology

## 2020-09-12 DIAGNOSIS — I6523 Occlusion and stenosis of bilateral carotid arteries: Secondary | ICD-10-CM

## 2020-09-15 NOTE — Progress Notes (Signed)
Called pt, no answer. Left vm requesting call back. AD/S

## 2020-09-17 DIAGNOSIS — R55 Syncope and collapse: Secondary | ICD-10-CM

## 2020-09-17 HISTORY — DX: Syncope and collapse: R55

## 2020-09-17 NOTE — Progress Notes (Signed)
Called patient, female on VM, so I did not leave a message.

## 2020-09-18 NOTE — Progress Notes (Signed)
Called and spoke with pt regarding carotid artery duplex. Pt voiced understanding. AD/S

## 2020-09-25 ENCOUNTER — Encounter: Payer: Self-pay | Admitting: Internal Medicine

## 2020-09-25 ENCOUNTER — Ambulatory Visit (INDEPENDENT_AMBULATORY_CARE_PROVIDER_SITE_OTHER): Payer: Medicare HMO | Admitting: Internal Medicine

## 2020-09-25 ENCOUNTER — Other Ambulatory Visit: Payer: Self-pay

## 2020-09-25 ENCOUNTER — Telehealth: Payer: Self-pay | Admitting: Internal Medicine

## 2020-09-25 DIAGNOSIS — I10 Essential (primary) hypertension: Secondary | ICD-10-CM | POA: Diagnosis not present

## 2020-09-25 DIAGNOSIS — J449 Chronic obstructive pulmonary disease, unspecified: Secondary | ICD-10-CM | POA: Diagnosis not present

## 2020-09-25 DIAGNOSIS — F17210 Nicotine dependence, cigarettes, uncomplicated: Secondary | ICD-10-CM | POA: Diagnosis not present

## 2020-09-25 MED ORDER — BREZTRI AEROSPHERE 160-9-4.8 MCG/ACT IN AERO
2.0000 | INHALATION_SPRAY | Freq: Two times a day (BID) | RESPIRATORY_TRACT | 11 refills | Status: DC
Start: 2020-09-25 — End: 2021-07-23

## 2020-09-25 MED ORDER — BISOPROLOL FUMARATE 10 MG PO TABS: 10.0000 mg | ORAL_TABLET | Freq: Every day | ORAL | 11 refills | Status: DC

## 2020-09-25 MED ORDER — BREZTRI AEROSPHERE 160-9-4.8 MCG/ACT IN AERO: 2.0000 | INHALATION_SPRAY | Freq: Two times a day (BID) | RESPIRATORY_TRACT | 11 refills | Status: DC

## 2020-09-25 MED ORDER — BREZTRI AEROSPHERE 160-9-4.8 MCG/ACT IN AERO: 2.0000 | INHALATION_SPRAY | Freq: Two times a day (BID) | RESPIRATORY_TRACT | 0 refills | Status: DC

## 2020-09-25 NOTE — Progress Notes (Signed)
Alyssa [name], female    DOB: [birth year]     MRN: [id number]   Brief patient profile:  52 yobf active smoker  With onset doe in 08X  With diastolic dysfunction and CRI assoc with chronic met acidosis and no spirometry on record then admit:     Admit date: 07/06/2020 Discharge date: 07/10/2020  Admitted From: Home Discharge disposition: Home   Code Status: Full Code  Diet Recommendation: Cardiac diet  Discharge Diagnosis:   Principal Problem:   Metabolic acidosis Active Problems:   Shortness of breath   PVD (peripheral vascular disease) (HCC)   CKD (chronic kidney disease) stage 4, GFR 15-29 ml/min (HCC)   Anemia   Protein-calorie malnutrition, severe (HCC)   Essential hypertension   Abdominal pain   Elevated INR   History of Present Illness / Brief narrative:  Patient is a 79 year old female history of CKD stage IV, hypertension, PAD who was admitted with COVID-19 and acute kidney injury last month. Since discharge patient has been experiencing progressively worsening shortness of breath.  9/19, she had shortness of breath, fever chills and rigors EMS was called.   EMS noted a temperature of 100.8.  In the ED, patient had a temperature of 99, tachycardic to 120, respiratory 25, blood pressure 160/102 Labs with lactate level of 3.2, serum bicarbonate level 9, anion gap 15 Chest x-ray did not show any acute infiltrates. Blood gas showed normal pH of 7.4, PCO2 low at 19, serum bicarb low at 12 Urinalysis showed large leukocyte esterase, negative nitrates, 6-10 WBCs, negative for blood.  Patient was admitted to hospitalist service for further evaluation management.  Subjective:  Seen and examined this morning. Pleasant elderly [name].  Lying down in bed.  Not in distress. No fever last 48 hours.  Blood pressure in 160s.  Breathing comfortably on room air.  Feels ready to go home today.  Hospital Course:  Acute on chronic metabolic  acidosis with respiratory compensation -Patient has chronic metabolic acidosis and was supposed to be on sodium bicarbonate tablet which apparently she was not compliant to. -She presented with a low bicarbonate level of 9.  Blood gas showed PCO2 level at 19. -Patient probably was hyperventilating to generate respiratory compensation for acute worsening of metabolic acidosis. -Pulmonary embolism was ruled out by a negative VQ scan. -Nephrology consult appreciated.  She was started on bicarbonate drip and subsequently switched to resume sodium bicarbonate tablets 1300 mg twice daily. -Continue to follow-up with nephrology as an outpatient  Sepsis ruled out -Initially suspected of sepsis because of respiratory distress. -Chest x-ray and CT imaging did not show any new infiltrates in lungs. -Sepsis ruled out now.  Antibiotics stopped.  Falls/tremors -Patient reports tremors and intermittent falls at home. -9/22, MRI brain was obtained which did not show any evidence of acute intracranial abnormality.  Chronic microvascular ischemic changes were noted. -PT evaluation was obtained.  Patient is functioning as ambulating independently. -Previous hospitalist switched her from Coreg to propanolol which we will continue at discharge.   Hypertension -Currently on clonidine, amlodipine and propanolol.  Blood pressure remains elevated to 160s today.  I would increase amlodipine from 2.5 mg to 5 mg daily at discharge.  CKD stage IV -Creatinine fluctuating but remains at baseline. -Follow-up with nephrology as an outpatient. 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 -Continue iron supplement at home. Recent Labs (within last 365 days)              Recent Labs    06/03/20 0413 06/03/20 1421 06/04/20 0251 06/05/20 0337 06/06/20 0628 06/07/20 0651 06/09/20 1604 07/06/20 1538 07/06/20 2147 07/10/20 0137  HGB 8.6* 8.2* 7.6* 7.1* 6.7* 9.2* 9.6* 9.1* 7.8* 8.5*     COVID-19 pneumonia 06/07/20 transiently needed 02 inpt only/ had been vaccinated prior -No residual symptoms.      History of Present Illness  09/25/2020  Pulmonary/ 1st office eval/Alyssa [name]  Chief Complaint  Patient presents with  . Consult    Shortness of breath with activity and sometimes when resting  Dyspnea:  25 ft with rollator for a year  Cough: none  Sleep: props up one one pillow/ bed is flat SABA use: couple times a day seems to help   No obvious day to day or daytime variability or assoc excess/ purulent sputum or mucus plugs or hemoptysis or cp or chest tightness, subjective wheeze or overt sinus or hb symptoms.   Sleeping as above without nocturnal  or early am exacerbation  of respiratory  c/o's or need for noct saba. Also denies any obvious fluctuation of symptoms with weather or environmental changes or other aggravating or alleviating factors except as outlined above   No unusual exposure hx or h/o childhood pna/ asthma or knowledge of premature birth.  Current Allergies, Complete Past Medical History, Past Surgical History, Family History, and Social History were reviewed in [employer] record.  ROS  The following are not active complaints unless bolded Hoarseness, sore throat, dysphagia, dental problems, itching, sneezing,  nasal congestion or discharge of excess mucus or purulent secretions, ear ache,   fever, chills, sweats, unintended wt loss or wt gain, classically pleuritic or exertional cp,  orthopnea pnd or arm/hand swelling  or leg swelling, presyncope, palpitations, abdominal pain, anorexia, nausea, vomiting, diarrhea   or change in bowel habits or change in bladder habits, change in stools or change in urine, dysuria, hematuria,  rash, arthralgias, visual complaints, headache, numbness, weakness or ataxia or problems with walking or coordination,  change in mood or  memory.              Past Medical History:  Diagnosis Date  . Acute respiratory failure with hypoxia ([place]) 11/01/2019  . Anxiety   . Arthritis   . Chest pain   . Chronic kidney disease   . Dyspnea   . Elevated troponin 12/13/2018  . Headache(784.0)   . Hyperlipidemia   . Hypertension   . Joint pain   . Leg pain   . Peripheral neuropathy   . Peripheral vascular disease [facility])     Outpatient Medications Prior to Visit  Medication Sig Dispense Refill  . acetaminophen (TYLENOL) 325 MG tablet Take 2 tablets (650 mg total) by mouth every 6 (six) hours as needed for mild pain, moderate pain or fever (or Fever >/= 101).    Marland Kitchen albuterol (VENTOLIN HFA) 108 (90 Base) MCG/ACT inhaler Inhale 2 puffs into the lungs every 4 (four) hours as needed for wheezing or shortness of breath.     Marland Kitchen amLODipine (NORVASC) 5 MG tablet Take 1 tablet (5  mg total) by mouth daily. (Patient taking differently: Take 5 mg by mouth daily. 1.5 tablets daily $RemoveBefor'@bedtime'DIgTaZJiamST$ ) 30 tablet 0  . aspirin 81 MG EC tablet Take 1 tablet (81 mg total) by mouth daily. 30 tablet 1  . cloNIDine (CATAPRES) 0.2 MG tablet Take 0.2 mg by mouth at bedtime.    . clopidogrel (PLAVIX) 75 MG tablet Take 1 tablet (75 mg total) by mouth daily. 30 tablet 1  . diclofenac Sodium (VOLTAREN) 1 % GEL Apply 2 g topically 4 (four) times daily as needed (pain).     Marland Kitchen dicyclomine (BENTYL) 20 MG tablet Take 20 mg by mouth 3 (three) times daily.    Marland Kitchen donepezil (ARICEPT) 5 MG tablet Take 5 mg by mouth daily.    . DULoxetine (CYMBALTA) 30 MG capsule Take 30 mg by mouth daily.    . feeding supplement, ENSURE ENLIVE, (ENSURE ENLIVE) LIQD Take 237 mLs by mouth 2 (two) times daily between meals. 237 mL 12  . Ferrous  Sulfate (IRON HIGH-POTENCY) 325 MG TABS Take 325 mg by mouth daily with breakfast.    . gabapentin (NEURONTIN) 100 MG capsule Take 100 mg by mouth 3 (three) times daily.    . hydrOXYzine (ATARAX/VISTARIL) 25 MG tablet Take 25 mg by mouth at bedtime as needed (sleep).     . magnesium oxide (MAG-OX) 400 MG tablet Take 400 mg by mouth daily.    . megestrol (MEGACE) 40 MG tablet Take 40 mg by mouth 2 (two) times daily.    . methocarbamol (ROBAXIN) 500 MG tablet Take 500 mg by mouth 3 (three) times daily as needed (back spasms).     . Multiple Vitamin (MULTIVITAMIN WITH MINERALS) TABS tablet Take 1 tablet by mouth daily.    . nitroGLYCERIN (NITROSTAT) 0.4 MG SL tablet Place 1 tablet (0.4 mg total) under the tongue every 5 (five) minutes as needed for chest pain. 30 tablet 0  . ondansetron (ZOFRAN ODT) 4 MG disintegrating tablet Take 1 tablet (4 mg total) by mouth every 4 (four) hours as needed for nausea or vomiting. 20 tablet 0  . pantoprazole (PROTONIX) 40 MG tablet Take 1 tablet (40 mg total) by mouth daily. 30 tablet 0  . propranolol (INDERAL) 40 MG tablet Take 1 tablet (40 mg total) by mouth 2 (two) times daily. 60 tablet 0  . rosuvastatin (CRESTOR) 40 MG tablet Take 1 tablet (40 mg total) by mouth at bedtime. (Patient taking differently: Take 40 mg by mouth daily.)    . sodium bicarbonate 650 MG tablet Take 2 tablets (1,300 mg total) by mouth 2 (two) times daily. 60 tablet 0  . valproic acid (DEPAKENE) 250 MG capsule Take 250 mg by mouth at bedtime.    . Vitamin D, Ergocalciferol, (DRISDOL) 1.25 MG (50000 UNIT) CAPS capsule Take 50,000 Units by mouth every 7 (seven) days. mondays    . busPIRone (BUSPAR) 30 MG tablet Take 45 mg by mouth daily. (Patient not taking: Reported on 09/25/2020)    . POLY-IRON 150 FORTE 150-25-1 MG-MCG-MG CAPS Take 1 capsule by mouth daily. (Patient not taking: Reported on 09/25/2020)     No facility-administered medications prior to visit.     Objective:     BP  130/82 (BP Location: Left Arm, Cuff Size: Normal)   Pulse 79   Temp 98.7 F (37.1 C) (Other (Comment)) Comment (Src): wrist  Ht 5' (1.524 m)   Wt 97 lb 12.8 oz (44.4 kg)   SpO2 100% Comment: room air  BMI  19.10 kg/m   SpO2: 100 % (room air)   Chronically ill thin bf nad  HEENT : pt wearing mask not removed for exam due to covid -19 concerns.    NECK :  without JVD/Nodes/TM/ nl carotid upstrokes bilaterally   LUNGS: no acc muscle use,  Mod barrel  contour chest wall with bilateral  Distant bs s audible wheeze and  without cough on insp or exp maneuvers and mod  Hyperresonant  to  percussion bilaterally     CV:  RRR  no s3 or murmur or increase in P2, and no edema   ABD:  soft and nontender with pos mid insp Hoover's  in the supine position. No bruits or organomegaly appreciated, bowel sounds nl  MS:     ext warm without deformities, calf tenderness, cyanosis or clubbing No obvious joint restrictions   SKIN: warm and dry without lesions    NEURO:  alert, approp, nl sensorium with  no motor or cerebellar deficits apparent.        I personally reviewed images and agree with radiology impression as follows:  CXR:   Portable 07/06/20  No active disease. My review: cm present and last pa and lat cxr 5 /28/21 c/w copd /cm mild to moderate     Assessment   COPD  GOLD ? still smoking  Active smoker -  09/25/2020  After extensive coaching inhaler device,  effectiveness =    75% from baseline 25 % > breztri trial   DDX of  difficult airways management almost all start with A and  include Adherence, Ace Inhibitors, Acid Reflux, Active Sinus Disease, Alpha 1 Antitripsin deficiency, Anxiety masquerading as Airways dz,  ABPA,  Allergy(esp in young), Aspiration (esp in elderly), Adverse effects of meds,  Active smoking or vaping, A bunch of PE's (a small clot burden can't cause this syndrome unless there is already severe underlying pulm or vascular dz with poor reserve) plus two Bs  =  Bronchiectasis and Beta blocker use..and one C= CHF   Adherence is always the initial "prime suspect" and is a multilayered concern that requires a "trust but verify" approach in every patient - starting with knowing how to use medications, especially inhalers, correctly, keeping up with refills and understanding the fundamental difference between maintenance and prns vs those medications only taken for a very short course and then stopped and not refilled.  - see hfa teaching - return with all meds in hand using a trust but verify approach to confirm accurate Medication  Reconciliation The principal here is that until we are certain that the  patients are doing what we've asked, it makes no sense to ask them to do more.   Active smoking also at top of list > see sep a/p  Allergy / asthma component ? Cover for now with high dose ics Reviewed saba: I spent extra time with pt today reviewing appropriate use of albuterol for prn use on exertion with the following points: 1) saba is for relief of sob that does not improve by walking a slower pace or resting but rather if the pt does not improve after trying this first. 2) If the pt is convinced, as many are, that saba helps recover from activity faster then it's easy to tell if this is the case by re-challenging : ie stop, take the inhaler, then p 5 minutes try the exact same activity (intensity of workload) that just caused the symptoms and see if they are substantially diminished  or not after saba 3) if there is an activity that reproducibly causes the symptoms, try the saba 15 min before the activity on alternate days   If in fact the saba really does help, then fine to continue to use it prn but advised may need to look closer at the maintenance regimen being used to achieve better control of airways disease with exertion.    ? Adverse side effects of meds > see discussion of propranolol below   ? Anxiety/depression/ deconditioning / cognitive  decline  > usually at the bottom of this list of usual suspects but should be much higher on this pt's based on H and P and note already on psychotropics and may interfere with adherence and also interpretation of response or lack thereof to symptom management which can be quite subjective.   ? Beta blocker effects: In the setting of refractory respiratory symptoms, it would be preferable to use bystolic, the most beta -1  selective Beta blocker available in sample form, with bisoprolol the most selective generic choice  on the market, at least on a trial basis, to make sure the spillover Beta 2 effects of the less specific Beta blockers are not contributing to this patient's symptoms. >>> see essential hbp  ? CHF/vol overload due to CRI  > note lat echo 05/2020 severe lvh> f/u cards planned    Essential hypertension Changed propranolol to bisoprolol 09/25/2020   >>>  Try bisoprolol 10 mg daily as propranolol is the least selective BB and may block the benefits of saba and laba     Cigarette smoker 4-5 min discussion re active cigarette smoking in addition to office E&M  Ask about tobacco use:   ongoing Advise quitting   I emphasized that although we never turn away smokers from the pulmonary clinic, we do ask that they understand that the recommendations that we make  won't work nearly as well in the presence of continued cigarette exposure. In fact, we may very well  reach a point where we can't promise to help the patient if he/she can't quit smoking. (We can and will promise to try to help, we just can't promise what we recommend will really work)  Assess willingness:  Not committed at this point Assist in quit attempt:  Per PCP when ready Arrange follow up:   Follow up per Primary Care planned        >>> f/u 4 weeks with meds in hand    Each maintenance medication was reviewed in detail including emphasizing most importantly the difference between maintenance and prns and under  what circumstances the prns are to be triggered using an action plan format where appropriate.  Total time for H and P, chart review, counseling, teaching device and generating customized AVS unique to this office visit / charting = 48  min  Exclusive on counseling re smoker                 Christinia Gully, MD 09/25/2020

## 2020-09-25 NOTE — Telephone Encounter (Signed)
LMTCB- need to know if they want 90 or 30 day supply

## 2020-09-25 NOTE — Patient Instructions (Addendum)
Stop propranolol and start bisoprolol 10 mg daily   Plan A = Automatic = Always=    Breztri Take 2 puffs first thing in am and then another 2 puffs about 12 hours later.    Work on inhaler technique:  relax and gently blow all the way out then take a nice smooth deep breath back in, triggering the inhaler at same time you start breathing in.  Hold for up to 5 seconds if you can. Blow out thru nose. Rinse and gargle with water when done    Plan B = Backup (to supplement plan A, not to replace it) Only use your albuterol inhaler as a rescue medication to be used if you can't catch your breath by resting or doing a relaxed purse lip breathing pattern.  - The less you use it, the better it will work when you need it. - Ok to use the inhaler up to 2 puffs  every 4 hours if you must but call for appointment if use goes up over your usual need - Don't leave home without it !!  (think of it like the spare tire for your car)      The key is to stop smoking completely before smoking completely stops you!   Please schedule a follow up office visit in 4 weeks, sooner if needed with pfts

## 2020-09-26 ENCOUNTER — Encounter: Payer: Self-pay | Admitting: Internal Medicine

## 2020-09-26 DIAGNOSIS — F17210 Nicotine dependence, cigarettes, uncomplicated: Secondary | ICD-10-CM | POA: Insufficient documentation

## 2020-09-26 DIAGNOSIS — J449 Chronic obstructive pulmonary disease, unspecified: Secondary | ICD-10-CM | POA: Insufficient documentation

## 2020-09-26 DIAGNOSIS — J441 Chronic obstructive pulmonary disease with (acute) exacerbation: Secondary | ICD-10-CM | POA: Insufficient documentation

## 2020-09-26 NOTE — Assessment & Plan Note (Signed)
4-5 min discussion re active cigarette smoking in addition to office E&M  Ask about tobacco use:   ongoing Advise quitting   I emphasized that although we never turn away smokers from the pulmonary clinic, we do ask that they understand that the recommendations that we make  won't work nearly as well in the presence of continued cigarette exposure. In fact, we may very well  reach a point where we can't promise to help the patient if he/she can't quit smoking. (We can and will promise to try to help, we just can't promise what we recommend will really work)  Assess willingness:  Not committed at this point Assist in quit attempt:  Per PCP when ready Arrange follow up:   Follow up per Primary Care planned           Each maintenance medication was reviewed in detail including emphasizing most importantly the difference between maintenance and prns and under what circumstances the prns are to be triggered using an action plan format where appropriate.  Total time for H and P, chart review, counseling, teaching device and generating customized AVS unique to this office visit / charting = 48  min  Exclusive on counseling re smoker

## 2020-09-26 NOTE — Assessment & Plan Note (Signed)
Changed propranolol to bisoprolol 09/25/2020   >>>  Try bisoprolol 10 mg daily as propranolol is the least selective BB and may block the benefits of saba and laba

## 2020-09-26 NOTE — Assessment & Plan Note (Signed)
Active smoker -  09/25/2020  After extensive coaching inhaler device,  effectiveness =    75% from baseline 25 % > breztri trial   DDX of  difficult airways management almost all start with A and  include Adherence, Ace Inhibitors, Acid Reflux, Active Sinus Disease, Alpha 1 Antitripsin deficiency, Anxiety masquerading as Airways dz,  ABPA,  Allergy(esp in young), Aspiration (esp in elderly), Adverse effects of meds,  Active smoking or vaping, A bunch of PE's (a small clot burden can't cause this syndrome unless there is already severe underlying pulm or vascular dz with poor reserve) plus two Bs  = Bronchiectasis and Beta blocker use..and one C= CHF   Adherence is always the initial "prime suspect" and is a multilayered concern that requires a "trust but verify" approach in every patient - starting with knowing how to use medications, especially inhalers, correctly, keeping up with refills and understanding the fundamental difference between maintenance and prns vs those medications only taken for a very short course and then stopped and not refilled.  - see hfa teaching - return with all meds in hand using a trust but verify approach to confirm accurate Medication  Reconciliation The principal here is that until we are certain that the  patients are doing what we've asked, it makes no sense to ask them to do more.   Active smoking also at top of list > see sep a/p  Allergy / asthma component ? Cover for now with high dose ics Reviewed saba: I spent extra time with pt today reviewing appropriate use of albuterol for prn use on exertion with the following points: 1) saba is for relief of sob that does not improve by walking a slower pace or resting but rather if the pt does not improve after trying this first. 2) If the pt is convinced, as many are, that saba helps recover from activity faster then it's easy to tell if this is the case by re-challenging : ie stop, take the inhaler, then p 5 minutes try  the exact same activity (intensity of workload) that just caused the symptoms and see if they are substantially diminished or not after saba 3) if there is an activity that reproducibly causes the symptoms, try the saba 15 min before the activity on alternate days   If in fact the saba really does help, then fine to continue to use it prn but advised may need to look closer at the maintenance regimen being used to achieve better control of airways disease with exertion.    ? Adverse side effects of meds > see discussion of propranolol below   ? Anxiety/depression/ deconditioning / cognitive decline  > usually at the bottom of this list of usual suspects but should be much higher on this pt's based on H and P and note already on psychotropics and may interfere with adherence and also interpretation of response or lack thereof to symptom management which can be quite subjective.   ? Beta blocker effects: In the setting of refractory respiratory symptoms, it would be preferable to use bystolic, the most beta -1  selective Beta blocker available in sample form, with bisoprolol the most selective generic choice  on the market, at least on a trial basis, to make sure the spillover Beta 2 effects of the less specific Beta blockers are not contributing to this patient's symptoms. >>> see essential hbp  ? CHF/vol overload due to CRI  > note lat echo 05/2020 severe lvh> f/u cards  planned

## 2020-09-29 ENCOUNTER — Inpatient Hospital Stay (HOSPITAL_COMMUNITY)
Admission: EM | Admit: 2020-09-29 | Discharge: 2020-10-03 | DRG: 683 | Disposition: A | Discharge status: Home Health | Payer: Medicare HMO | Attending: Internal Medicine | Admitting: Internal Medicine

## 2020-09-29 ENCOUNTER — Other Ambulatory Visit: Payer: Self-pay

## 2020-09-29 ENCOUNTER — Emergency Department (HOSPITAL_COMMUNITY): Payer: Medicare HMO

## 2020-09-29 ENCOUNTER — Encounter (HOSPITAL_COMMUNITY): Payer: Self-pay

## 2020-09-29 DIAGNOSIS — E869 Volume depletion, unspecified: Secondary | ICD-10-CM | POA: Diagnosis present

## 2020-09-29 DIAGNOSIS — G894 Chronic pain syndrome: Secondary | ICD-10-CM | POA: Diagnosis present

## 2020-09-29 DIAGNOSIS — N184 Chronic kidney disease, stage 4 (severe): Secondary | ICD-10-CM | POA: Diagnosis present

## 2020-09-29 DIAGNOSIS — Z96641 Presence of right artificial hip joint: Secondary | ICD-10-CM | POA: Diagnosis present

## 2020-09-29 DIAGNOSIS — J449 Chronic obstructive pulmonary disease, unspecified: Secondary | ICD-10-CM | POA: Diagnosis present

## 2020-09-29 DIAGNOSIS — M21372 Foot drop, left foot: Secondary | ICD-10-CM | POA: Diagnosis present

## 2020-09-29 DIAGNOSIS — F0390 Unspecified dementia without behavioral disturbance: Secondary | ICD-10-CM | POA: Diagnosis present

## 2020-09-29 DIAGNOSIS — M1990 Unspecified osteoarthritis, unspecified site: Secondary | ICD-10-CM | POA: Diagnosis present

## 2020-09-29 DIAGNOSIS — Z83438 Family history of other disorder of lipoprotein metabolism and other lipidemia: Secondary | ICD-10-CM

## 2020-09-29 DIAGNOSIS — Z7902 Long term (current) use of antithrombotics/antiplatelets: Secondary | ICD-10-CM

## 2020-09-29 DIAGNOSIS — E871 Hypo-osmolality and hyponatremia: Secondary | ICD-10-CM | POA: Diagnosis not present

## 2020-09-29 DIAGNOSIS — R55 Syncope and collapse: Secondary | ICD-10-CM | POA: Diagnosis not present

## 2020-09-29 DIAGNOSIS — I739 Peripheral vascular disease, unspecified: Secondary | ICD-10-CM | POA: Diagnosis present

## 2020-09-29 DIAGNOSIS — E875 Hyperkalemia: Secondary | ICD-10-CM | POA: Diagnosis not present

## 2020-09-29 DIAGNOSIS — Z20822 Contact with and (suspected) exposure to covid-19: Secondary | ICD-10-CM | POA: Diagnosis present

## 2020-09-29 DIAGNOSIS — Z79899 Other long term (current) drug therapy: Secondary | ICD-10-CM

## 2020-09-29 DIAGNOSIS — Z8616 Personal history of COVID-19: Secondary | ICD-10-CM

## 2020-09-29 DIAGNOSIS — I2510 Atherosclerotic heart disease of native coronary artery without angina pectoris: Secondary | ICD-10-CM | POA: Diagnosis present

## 2020-09-29 DIAGNOSIS — F17210 Nicotine dependence, cigarettes, uncomplicated: Secondary | ICD-10-CM | POA: Diagnosis present

## 2020-09-29 DIAGNOSIS — Z809 Family history of malignant neoplasm, unspecified: Secondary | ICD-10-CM

## 2020-09-29 DIAGNOSIS — I252 Old myocardial infarction: Secondary | ICD-10-CM

## 2020-09-29 DIAGNOSIS — E785 Hyperlipidemia, unspecified: Secondary | ICD-10-CM | POA: Diagnosis present

## 2020-09-29 DIAGNOSIS — I701 Atherosclerosis of renal artery: Secondary | ICD-10-CM | POA: Diagnosis present

## 2020-09-29 DIAGNOSIS — G629 Polyneuropathy, unspecified: Secondary | ICD-10-CM | POA: Diagnosis present

## 2020-09-29 DIAGNOSIS — E872 Acidosis: Secondary | ICD-10-CM | POA: Diagnosis present

## 2020-09-29 DIAGNOSIS — I129 Hypertensive chronic kidney disease with stage 1 through stage 4 chronic kidney disease, or unspecified chronic kidney disease: Secondary | ICD-10-CM | POA: Diagnosis present

## 2020-09-29 DIAGNOSIS — N179 Acute kidney failure, unspecified: Principal | ICD-10-CM | POA: Diagnosis present

## 2020-09-29 DIAGNOSIS — M8448XD Pathological fracture, other site, subsequent encounter for fracture with routine healing: Secondary | ICD-10-CM | POA: Diagnosis present

## 2020-09-29 DIAGNOSIS — Z7982 Long term (current) use of aspirin: Secondary | ICD-10-CM

## 2020-09-29 DIAGNOSIS — D631 Anemia in chronic kidney disease: Secondary | ICD-10-CM | POA: Diagnosis present

## 2020-09-29 DIAGNOSIS — Z8249 Family history of ischemic heart disease and other diseases of the circulatory system: Secondary | ICD-10-CM

## 2020-09-29 DIAGNOSIS — R5381 Other malaise: Secondary | ICD-10-CM

## 2020-09-29 DIAGNOSIS — Z7951 Long term (current) use of inhaled steroids: Secondary | ICD-10-CM

## 2020-09-29 LAB — CBC WITH DIFFERENTIAL/PLATELET
Abs Immature Granulocytes: 0.06 10*3/uL (ref 0.00–0.07)
Basophils Absolute: 0.1 10*3/uL (ref 0.0–0.1)
Basophils Relative: 1 %
Eosinophils Absolute: 0.2 10*3/uL (ref 0.0–0.5)
Eosinophils Relative: 3 %
HCT: 32.6 % — ABNORMAL LOW (ref 36.0–46.0)
Hemoglobin: 9.7 g/dL — ABNORMAL LOW (ref 12.0–15.0)
Immature Granulocytes: 1 %
Lymphocytes Relative: 19 %
Lymphs Abs: 1.7 10*3/uL (ref 0.7–4.0)
MCH: 26.6 pg (ref 26.0–34.0)
MCHC: 29.8 g/dL — ABNORMAL LOW (ref 30.0–36.0)
MCV: 89.6 fL (ref 80.0–100.0)
Monocytes Absolute: 1 10*3/uL (ref 0.1–1.0)
Monocytes Relative: 12 %
Neutro Abs: 5.7 10*3/uL (ref 1.7–7.7)
Neutrophils Relative %: 64 %
Platelets: 438 10*3/uL — ABNORMAL HIGH (ref 150–400)
RBC: 3.64 MIL/uL — ABNORMAL LOW (ref 3.87–5.11)
RDW: 18.3 % — ABNORMAL HIGH (ref 11.5–15.5)
WBC: 8.8 10*3/uL (ref 4.0–10.5)
nRBC: 0 % (ref 0.0–0.2)

## 2020-09-29 LAB — RENAL FUNCTION PANEL
Albumin: 3.4 g/dL — ABNORMAL LOW (ref 3.5–5.0)
Anion gap: 10 (ref 5–15)
BUN: 51 mg/dL — ABNORMAL HIGH (ref 8–23)
CO2: 14 mmol/L — ABNORMAL LOW (ref 22–32)
Calcium: 9.3 mg/dL (ref 8.9–10.3)
Chloride: 111 mmol/L (ref 98–111)
Creatinine, Ser: 2.55 mg/dL — ABNORMAL HIGH (ref 0.44–1.00)
GFR, Estimated: 19 mL/min — ABNORMAL LOW (ref >60)
Glucose, Bld: 92 mg/dL (ref 70–99)
Phosphorus: 4.5 mg/dL (ref 2.5–4.6)
Potassium: 6.7 mmol/L (ref 3.5–5.1)
Sodium: 135 mmol/L (ref 135–145)

## 2020-09-29 LAB — I-STAT VENOUS BLOOD GAS, ED
Acid-base deficit: 10 mmol/L — ABNORMAL HIGH (ref 0.0–2.0)
Bicarbonate: 15.1 mmol/L — ABNORMAL LOW (ref 20.0–28.0)
Calcium, Ion: 1.22 mmol/L (ref 1.15–1.40)
HCT: 30 % — ABNORMAL LOW (ref 36.0–46.0)
Hemoglobin: 10.2 g/dL — ABNORMAL LOW (ref 12.0–15.0)
O2 Saturation: 60 %
Potassium: 6.6 mmol/L (ref 3.5–5.1)
Sodium: 137 mmol/L (ref 135–145)
TCO2: 16 mmol/L — ABNORMAL LOW (ref 22–32)
pCO2, Ven: 29.6 mmHg — ABNORMAL LOW (ref 44.0–60.0)
pH, Ven: 7.315 (ref 7.250–7.430)
pO2, Ven: 33 mmHg (ref 32.0–45.0)

## 2020-09-29 LAB — CBC
HCT: 31.7 % — ABNORMAL LOW (ref 36.0–46.0)
Hemoglobin: 9.9 g/dL — ABNORMAL LOW (ref 12.0–15.0)
MCH: 26.8 pg (ref 26.0–34.0)
MCHC: 31.2 g/dL (ref 30.0–36.0)
MCV: 85.9 fL (ref 80.0–100.0)
Platelets: 450 10*3/uL — ABNORMAL HIGH (ref 150–400)
RBC: 3.69 MIL/uL — ABNORMAL LOW (ref 3.87–5.11)
RDW: 18.2 % — ABNORMAL HIGH (ref 11.5–15.5)
WBC: 10 10*3/uL (ref 4.0–10.5)
nRBC: 0 % (ref 0.0–0.2)

## 2020-09-29 LAB — COMPREHENSIVE METABOLIC PANEL
ALT: 14 U/L (ref 0–44)
AST: 17 U/L (ref 15–41)
Albumin: 3.4 g/dL — ABNORMAL LOW (ref 3.5–5.0)
Alkaline Phosphatase: 77 U/L (ref 38–126)
Anion gap: 13 (ref 5–15)
BUN: 53 mg/dL — ABNORMAL HIGH (ref 8–23)
CO2: 13 mmol/L — ABNORMAL LOW (ref 22–32)
Calcium: 9.1 mg/dL (ref 8.9–10.3)
Chloride: 110 mmol/L (ref 98–111)
Creatinine, Ser: 2.81 mg/dL — ABNORMAL HIGH (ref 0.44–1.00)
GFR, Estimated: 17 mL/min — ABNORMAL LOW (ref >60)
Glucose, Bld: 128 mg/dL — ABNORMAL HIGH (ref 70–99)
Potassium: 6 mmol/L — ABNORMAL HIGH (ref 3.5–5.1)
Sodium: 136 mmol/L (ref 135–145)
Total Bilirubin: 0.4 mg/dL (ref 0.3–1.2)
Total Protein: 6.7 g/dL (ref 6.5–8.1)

## 2020-09-29 LAB — MAGNESIUM: Magnesium: 2.5 mg/dL — ABNORMAL HIGH (ref 1.7–2.4)

## 2020-09-29 MED ORDER — ACETAMINOPHEN 650 MG RE SUPP: 650.0000 mg | Freq: Four times a day (QID) | RECTAL | Status: DC | PRN

## 2020-09-29 MED ORDER — BUDESON-GLYCOPYRROL-FORMOTEROL 160-9-4.8 MCG/ACT IN AERO: 2.0000 | INHALATION_SPRAY | Freq: Two times a day (BID) | RESPIRATORY_TRACT | Status: DC

## 2020-09-29 MED ORDER — SODIUM CHLORIDE 0.9 % IV BOLUS
1000.0000 mL | Freq: Once | INTRAVENOUS | Status: AC
  Administered 2020-09-29: 1000 mL via INTRAVENOUS

## 2020-09-29 MED ORDER — ONDANSETRON HCL 4 MG/2ML IJ SOLN: 4.0000 mg | Freq: Four times a day (QID) | INTRAMUSCULAR | Status: DC | PRN

## 2020-09-29 MED ORDER — HEPARIN SODIUM (PORCINE) 5000 UNIT/ML IJ SOLN
5000.0000 [IU] | Freq: Three times a day (TID) | INTRAMUSCULAR | Status: DC
  Administered 2020-09-29 – 2020-10-03 (×11): 5000 [IU] via SUBCUTANEOUS
  Filled 2020-09-29 (×11): qty 1

## 2020-09-29 MED ORDER — ACETAMINOPHEN 325 MG PO TABS
650.0000 mg | ORAL_TABLET | Freq: Four times a day (QID) | ORAL | Status: DC | PRN
  Administered 2020-10-01 – 2020-10-03 (×6): 650 mg via ORAL
  Filled 2020-09-29 (×6): qty 2

## 2020-09-29 MED ORDER — SODIUM BICARBONATE 8.4 % IV SOLN
INTRAVENOUS | Status: AC
  Filled 2020-09-29 (×9): qty 50

## 2020-09-29 MED ORDER — AMLODIPINE BESYLATE 5 MG PO TABS
2.5000 mg | ORAL_TABLET | Freq: Every day | ORAL | Status: DC
  Administered 2020-09-30: 2.5 mg via ORAL
  Filled 2020-09-29: qty 1

## 2020-09-29 MED ORDER — POLYETHYLENE GLYCOL 3350 17 G PO PACK: 17.0000 g | PACK | Freq: Every day | ORAL | Status: DC | PRN

## 2020-09-29 MED ORDER — DULOXETINE HCL 30 MG PO CPEP
30.0000 mg | ORAL_CAPSULE | Freq: Every day | ORAL | Status: DC
  Administered 2020-09-30 – 2020-10-03 (×4): 30 mg via ORAL
  Filled 2020-09-29 (×4): qty 1

## 2020-09-29 MED ORDER — DONEPEZIL HCL 5 MG PO TABS
5.0000 mg | ORAL_TABLET | Freq: Every day | ORAL | Status: DC
  Administered 2020-09-30 – 2020-10-03 (×4): 5 mg via ORAL
  Filled 2020-09-29 (×4): qty 1

## 2020-09-29 MED ORDER — CLONIDINE HCL 0.2 MG PO TABS
0.2000 mg | ORAL_TABLET | Freq: Every day | ORAL | Status: DC
  Administered 2020-09-29 – 2020-10-02 (×4): 0.2 mg via ORAL
  Filled 2020-09-29 (×4): qty 1

## 2020-09-29 MED ORDER — ASPIRIN EC 81 MG PO TBEC
81.0000 mg | DELAYED_RELEASE_TABLET | Freq: Every day | ORAL | Status: DC
  Administered 2020-09-30 – 2020-10-03 (×4): 81 mg via ORAL
  Filled 2020-09-29 (×4): qty 1

## 2020-09-29 MED ORDER — SODIUM ZIRCONIUM CYCLOSILICATE 10 G PO PACK
10.0000 g | PACK | Freq: Once | ORAL | Status: AC
  Administered 2020-09-29: 10 g via ORAL
  Filled 2020-09-29: qty 1

## 2020-09-29 MED ORDER — ONDANSETRON HCL 4 MG PO TABS: 4.0000 mg | ORAL_TABLET | Freq: Four times a day (QID) | ORAL | Status: DC | PRN

## 2020-09-29 NOTE — ED Provider Notes (Signed)
[facility] EMERGENCY DEPARTMENT Provider Note   CSN: [id number] Arrival date & time: 09/29/20  1352     History Chief Complaint  Patient presents with  . Loss of Consciousness    Alyssa [name] is a 79 y.o. female with PMHx HTN, HLD, COPD, CAD s/p NSTEMI, and dementia who presents to the ED via EMS for syncopal episode.  She reports she was getting up to go to the bathroom when she passed out.  She remembers feeling lightheaded prior.  Denies any dizziness, chest pain, shortness of breath.  Husband told EMS that when she passed out she was jerking all over for a few seconds.  No history of seizure.  Patient currently has a complaint of a headache and neck pain.   No information obtained by patient's husband [name] on the phone.  He reports that patient was getting up to use the bathroom from a seated position when she called out to him that she felt like she was going to pass out.  He quickly ran into the room and was able to catch her on her way down to the ground.  He states that she was having some shaking all over that lasted only a few seconds prior to him putting her down on the ground.  He states that he asked her if she wanted him to call the ambulance and she was able to respond to "no."  He states that he was able to ask her questions and she was communicating appropriately.  No post ictal state.  No trauma to her tongue or urinary incontinence.  States that patient never fully passed out either.   The history is provided by the patient, the spouse, a relative, the EMS personnel and medical records.       Past Medical History:  Diagnosis Date  . Acute respiratory failure with hypoxia ([place]) 11/01/2019  . Anxiety   . Arthritis   . Chest pain   . Chronic kidney disease   . Dyspnea   . Elevated troponin 12/13/2018  . Headache(784.0)   . Hyperlipidemia   . Hypertension   . Joint pain   . Leg pain   . Peripheral neuropathy   . Peripheral vascular  disease [facility])     Patient Active Problem List   Diagnosis Date Noted  . COPD  GOLD ? still smoking  09/26/2020  . Cigarette smoker 09/26/2020  . Shortness of breath 07/06/2020  . Essential hypertension 07/06/2020  . Abdominal pain 07/06/2020  . Elevated INR 07/06/2020  . Acute respiratory failure due to COVID-19 ([place]) 06/02/2020  . Acute renal failure superimposed on chronic kidney disease ([place]) 02/06/2020  . Leukocytosis 02/06/2020  . Thrombocytosis 02/06/2020  . Palliative care encounter   . Constipation 01/05/2020  . Protein-calorie malnutrition, severe ([name]) 01/04/2020  . Acute on chronic renal failure ([place]) 01/04/2020  . Hyperkalemia 01/03/2020  . AKI (acute kidney injury) ([place]) 01/03/2020  . Acute respiratory failure with hypoxia ([place]) 11/01/2019  . UTI (urinary tract infection) 11/01/2019  . CAP (community acquired pneumonia) 11/01/2019  . Acute cystitis without hematuria   . Encephalopathy 09/18/2019  . SAH (subarachnoid hemorrhage) ([place]) 09/18/2019  . Dementia ([address]) 09/18/2019  . Metabolic acidosis 87/56/4332  . ARF (acute renal failure) ([place]) 09/17/2019  . Acute encephalopathy 09/17/2019  . Abnormal CT scan, colon   . Diarrhea   . Diverticulitis 06/07/2019  . Depression with anxiety 06/07/2019  . Volume overload 03/23/2019  . Edema 03/23/2019  . Acute  renal failure (ARF) ([place]) 03/08/2019  . Hypokalemia 03/08/2019  . Palliative care by specialist   . Goals of care, counseling/discussion   . Altered mental status   . Encephalopathy, toxic 12/24/2018  . Aspiration pneumonia of both lower lobes due to gastric secretions ([place]) 12/24/2018  . Non-ST elevation (NSTEMI) myocardial infarction ([place])   . Chest pain   . Dyspnea on exertion   . Elevated troponin level 12/09/2018  . Chronic migraine 03/27/2018  . Lumbar radiculopathy 03/27/2018  . Gait abnormality 03/27/2018  . CKD (chronic kidney disease) stage 4, GFR 15-29 ml/min (HCC) 12/28/2016  . Anemia 12/28/2016   . Chronic pain syndrome 12/28/2016  . Benzodiazepine withdrawal without complication ([place]) 01/75/1025  . Chronic right shoulder pain 12/06/2016  . Fall 09/17/2016  . Fracture of humeral head, closed, right, initial encounter 09/17/2016  . Rib fractures 09/17/2016  . Multiple falls 09/17/2016  . Severe muscle deconditioning 09/17/2016  . Failure to thrive in adult 09/17/2016  . Melena 04/25/2016  . Transient hypotension 04/25/2016  . Anxiety state 04/25/2016  . Tobacco abuse 04/25/2016  . Hyperlipidemia 04/25/2016  . Syncope 04/24/2016  . Near syncope 04/24/2016  . Gait difficulty 01/12/2016  . Foot drop, left 01/12/2016  . Severe recurrent major depression without psychotic features ([place]) 08/28/2015  . Spondylolisthesis at L4-L5 level 07/30/2015  . PVD (peripheral vascular disease) ([place]) 07/29/2014  . Weakness-Bilateral arm/leg 07/29/2014  . Numbness-left leg 07/29/2014  . Swelling of limb-Legs 07/29/2014  . Atherosclerosis of native arteries of the extremities with intermittent claudication 09/30/2011    Past Surgical History:  Procedure Laterality Date  . ABDOMINAL ANGIOGRAM N/A 10/29/2011   Procedure: ABDOMINAL ANGIOGRAM;  Surgeon: Elam Dutch, MD;  Location: [facility] CATH LAB;  Service: Cardiovascular;  Laterality: N/A;  . BACK SURGERY    . FEMORAL-FEMORAL BYPASS GRAFT  08/31/2010  . FLEXIBLE SIGMOIDOSCOPY N/A 06/12/2019   Procedure: FLEXIBLE SIGMOIDOSCOPY;  Surgeon: Ladene Artist, MD;  Location[facility] ENDOSCOPY;  Service: Endoscopy;  Laterality: N/A;  Patient had little bit to eat this morning so this will be unsedated.  Marland Kitchen JOINT REPLACEMENT Right 2012   Hip  . LOWER EXTREMITY ANGIOGRAPHY  06/14/2017   Procedure: Lower Extremity Angiography;  Surgeon: Adrian Prows, MD;  Location: [facility] CV LAB;  Service: Cardiovascular;;  Bilateral limited angio performed  . RENAL ANGIOGRAPHY N/A 06/14/2017   Procedure: Renal Angiography;  Surgeon: Adrian Prows, MD;  Location: [facility] CV  LAB;  Service: Cardiovascular;  Laterality: N/A;  . TOTAL HIP ARTHROPLASTY     right     OB History   No obstetric history on file.     Family History  Problem Relation Age of Onset  . Heart attack Mother   . Heart disease Mother   . Hyperlipidemia Mother   . Hypertension Mother   . Heart attack Father   . Heart disease Father        Before age 34  . Hyperlipidemia Father   . Hypertension Father   . Cancer Sister        brain tumor  . Aneurysm Brother        brain  . Colon cancer Neg Hx   . Liver cancer Neg Hx     Social History   Tobacco Use  . Smoking status: Current Every Day Smoker    Packs/day: 1.00    Years: 27.00    Pack years: 27.00    Types: Cigarettes  . Smokeless tobacco: Never Used  .  Tobacco comment: smokes 1 pack per day 09/25/2020  Vaping Use  . Vaping Use: Never used  Substance Use Topics  . Alcohol use: No  . Drug use: No    Home Medications Prior to Admission medications   Medication Sig Start Date End Date Taking? Authorizing Provider  acetaminophen (TYLENOL) 325 MG tablet Take 2 tablets (650 mg total) by mouth every 6 (six) hours as needed for mild pain, moderate pain or fever (or Fever >/= 101). 06/16/19   Hongalgi, Lenis Dickinson, MD  albuterol (VENTOLIN HFA) 108 (90 Base) MCG/ACT inhaler Inhale 2 puffs into the lungs every 4 (four) hours as needed for wheezing or shortness of breath.     [provider]  amLODipine (NORVASC) 5 MG tablet Take 1 tablet (5 mg total) by mouth daily. Patient taking differently: Take 5 mg by mouth daily. 1.5 tablets daily @bedtime  07/11/20 08/10/20  Terrilee Croak, MD  aspirin 81 MG EC tablet Take 1 tablet (81 mg total) by mouth daily. 01/09/19   Caren Griffins, MD  bisoprolol (ZEBETA) 10 MG tablet Take 1 tablet (10 mg total) by mouth daily. 09/25/20   Tanda Rockers, MD  Budeson-Glycopyrrol-Formoterol (BREZTRI AEROSPHERE) 160-9-4.8 MCG/ACT AERO Inhale 2 puffs into the lungs 2 (two) times daily. 09/25/20   Tanda Rockers, MD  Budeson-Glycopyrrol-Formoterol (BREZTRI AEROSPHERE) 160-9-4.8 MCG/ACT AERO Inhale 2 puffs into the lungs in the morning and at bedtime. 09/25/20   Tanda Rockers, MD  cloNIDine (CATAPRES) 0.2 MG tablet Take 0.2 mg by mouth at bedtime. 05/25/20   [provider]  clopidogrel (PLAVIX) 75 MG tablet Take 1 tablet (75 mg total) by mouth daily. 01/09/19   Caren Griffins, MD  diclofenac Sodium (VOLTAREN) 1 % GEL Apply 2 g topically 4 (four) times daily as needed (pain).  01/15/20   [provider]  dicyclomine (BENTYL) 20 MG tablet Take 20 mg by mouth 3 (three) times daily. 04/01/20   [provider]  donepezil (ARICEPT) 5 MG tablet Take 5 mg by mouth daily.    [provider]  DULoxetine (CYMBALTA) 30 MG capsule Take 30 mg by mouth daily.    [provider]  feeding supplement, ENSURE ENLIVE, (ENSURE ENLIVE) LIQD Take 237 mLs by mouth 2 (two) times daily between meals. 06/16/19   Hongalgi, Lenis Dickinson, MD  Ferrous Sulfate (IRON HIGH-POTENCY) 325 MG TABS Take 325 mg by mouth daily with breakfast. 06/30/19   Hongalgi, Lenis Dickinson, MD  gabapentin (NEURONTIN) 100 MG capsule Take 100 mg by mouth 3 (three) times daily. 10/25/19   [provider]  hydrOXYzine (ATARAX/VISTARIL) 25 MG tablet Take 25 mg by mouth at bedtime as needed (sleep).  05/12/20   [provider]  magnesium oxide (MAG-OX) 400 MG tablet Take 400 mg by mouth daily.    [provider]  megestrol (MEGACE) 40 MG tablet Take 40 mg by mouth 2 (two) times daily. 05/12/20   [provider]  methocarbamol (ROBAXIN) 500 MG tablet Take 500 mg by mouth 3 (three) times daily as needed (back spasms).  05/12/20   [provider]  Multiple Vitamin (MULTIVITAMIN WITH MINERALS) TABS tablet Take 1 tablet by mouth daily. 06/17/19   Hongalgi, Lenis Dickinson, MD  nitroGLYCERIN (NITROSTAT) 0.4 MG SL tablet Place 1 tablet (0.4 mg total) under the tongue every 5 (five) minutes as needed for  chest pain. 01/07/20   Allie Bossier, MD  ondansetron (ZOFRAN ODT) 4 MG disintegrating tablet Take 1 tablet (4  mg total) by mouth every 4 (four) hours as needed for nausea or vomiting. 06/09/20   Charlesetta Shanks, MD  pantoprazole (PROTONIX) 40 MG tablet Take 1 tablet (40 mg total) by mouth daily. 06/16/19   Hongalgi, Lenis Dickinson, MD  rosuvastatin (CRESTOR) 40 MG tablet Take 1 tablet (40 mg total) by mouth at bedtime. Patient taking differently: Take 40 mg by mouth daily. 06/16/19   Hongalgi, Lenis Dickinson, MD  sodium bicarbonate 650 MG tablet Take 2 tablets (1,300 mg total) by mouth 2 (two) times daily. 01/07/20   Allie Bossier, MD  valproic acid (DEPAKENE) 250 MG capsule Take 250 mg by mouth at bedtime. 05/12/20   [provider]  Vitamin D, Ergocalciferol, (DRISDOL) 1.25 MG (50000 UNIT) CAPS capsule Take 50,000 Units by mouth every 7 (seven) days. mondays    [provider]    Allergies    Patient has no known allergies.  Review of Systems   Review of Systems  Constitutional: Negative for chills and fever.  Eyes: Negative for visual disturbance.  Respiratory: Negative for shortness of breath.   Cardiovascular: Negative for chest pain.  Gastrointestinal: Negative for nausea and vomiting.  Musculoskeletal: Positive for neck pain.  Neurological: Positive for light-headedness and headaches. Negative for syncope and speech difficulty.       + near syncope Questionable seizure like activity  All other systems reviewed and are negative.   Physical Exam Updated Vital Signs BP (!) 150/89 (BP Location: Left Arm)   Pulse 67   Temp 97.8 F (36.6 C) (Oral)   Resp 17   SpO2 100%   Physical Exam Vitals and nursing note reviewed.  Constitutional:      Appearance: She is diaphoretic. She is not ill-appearing.  HENT:     Head: Normocephalic and atraumatic.     Comments: No raccoon's sign or battle's sign. Negative hemotympanum bilaterally.  Eyes:     Extraocular Movements:  Extraocular movements intact.     Conjunctiva/sclera: Conjunctivae normal.     Pupils: Pupils are equal, round, and reactive to light.  Neck:     Comments: + midline and paracervical musculature TTP bilaterally. C collar place.  Cardiovascular:     Rate and Rhythm: Normal rate and regular rhythm.     Pulses: Normal pulses.  Pulmonary:     Effort: Pulmonary effort is normal.     Breath sounds: Normal breath sounds. No wheezing, rhonchi or rales.  Abdominal:     Palpations: Abdomen is soft.     Tenderness: There is no abdominal tenderness. There is no guarding or rebound.  Musculoskeletal:     Cervical back: Neck supple. Tenderness present.  Skin:    General: Skin is warm.  Neurological:     Mental Status: She is alert.     Comments: Alert and oriented to self, place, and event. Pt believes it is 2002 however is able to say that Barbette Or is the presient  Speech is fluent, clear without dysarthria or dysphasia.   Strength 5/5 in upper/lower extremities  Sensation intact in upper/lower extremities   Normal gait.  Negative Romberg. No pronator drift.  Normal finger-to-nose and feet tapping.  CN I not tested  CN II grossly intact visual fields bilaterally. Did not visualize posterior eye.   CN III, IV, VI PERRLA and EOMs intact bilaterally  CN V Intact sensation to sharp and light touch to the face  CN VII facial movements symmetric  CN VIII not tested  CN IX, X no  uvula deviation, symmetric rise of soft palate  CN XI 5/5 SCM and trapezius strength bilaterally  CN XII Midline tongue protrusion, symmetric L/R movements      ED Results / Procedures / Treatments   Labs (all labs ordered are listed, but only abnormal results are displayed) Labs Reviewed  COMPREHENSIVE METABOLIC PANEL - Abnormal; Notable for the following components:      Result Value   Potassium 6.0 (*)    CO2 13 (*)    Glucose, Bld 128 (*)    BUN 53 (*)    Creatinine, Ser 2.81 (*)    Albumin 3.4 (*)    GFR,  Estimated 17 (*)    All other components within normal limits  CBC WITH DIFFERENTIAL/PLATELET - Abnormal; Notable for the following components:   RBC 3.64 (*)    Hemoglobin 9.7 (*)    HCT 32.6 (*)    MCHC 29.8 (*)    RDW 18.3 (*)    Platelets 438 (*)    All other components within normal limits  MAGNESIUM - Abnormal; Notable for the following components:   Magnesium 2.5 (*)    All other components within normal limits  RESP PANEL BY RT-PCR (FLU A&B, COVID) [id number]  URINALYSIS, ROUTINE W REFLEX MICROSCOPIC  OSMOLALITY, URINE  I-STAT VENOUS BLOOD GAS, ED    EKG EKG Interpretation  Date/Time:  Monday September 29 2020 13:53:50 EST Ventricular Rate:  65 PR Interval:    QRS Duration: 77 QT Interval:  441 QTC Calculation: 459 R Axis:   63 Text Interpretation: Sinus rhythm Probable LVH with secondary repol abnrm No acute changes twi in lateral leads No significant change since last tracing Confirmed by Varney Biles [phone]) on 09/29/2020 2:25:12 PM   Radiology CT Head Wo Contrast  Result Date: 09/29/2020 CLINICAL DATA:  Syncope, headache, head and neck trauma EXAM: CT HEAD WITHOUT CONTRAST CT CERVICAL SPINE WITHOUT CONTRAST TECHNIQUE: Multidetector CT imaging of the head and cervical spine was performed following the standard protocol without intravenous contrast. Multiplanar CT image reconstructions of the cervical spine were also generated. COMPARISON:  01/02/2020, 07/09/2020 FINDINGS: CT HEAD FINDINGS Brain: No evidence of acute infarction, hemorrhage, hydrocephalus, extra-axial collection or mass lesion/mass effect. Moderate low-density changes within the periventricular and subcortical white matter compatible with chronic microvascular ischemic change. Mild diffuse cerebral volume loss. Vascular: Atherosclerotic calcifications involving the large vessels of the skull base. No unexpected hyperdense vessel. Skull: Normal. Negative for fracture or focal lesion. Sinuses/Orbits: Mucosal  thickening within the visualized right maxillary sinus and right sphenoid sinus. Scattered partial opacification within several mastoid air cells bilaterally. Orbital structures within normal limits. Other: Negative for scalp hematoma. CT CERVICAL SPINE FINDINGS Alignment: Unchanged grade 1 anterolisthesis C4 on C5, C5 on C6, and C7 on T1. No facet joint dislocation. Dens and lateral masses remain aligned. Skull base and vertebrae: No acute fracture. No primary bone lesion or focal pathologic process. Soft tissues and spinal canal: No prevertebral fluid or swelling. No visible canal hematoma. Disc levels: Advanced multilevel degenerative changes within the cervical spine and visualized upper thoracic spine, not appreciably changed compared to the prior study. Upper chest: Centrilobular emphysema within the visualized lung apices. Other: Enlarged multinodular thyroid gland. Aortic and bilateral carotid atherosclerosis. IMPRESSION: 1. No acute intracranial findings. 2. Chronic microvascular ischemic changes and cerebral volume loss. 3. No evidence of acute fracture or traumatic subluxation of the cervical spine. 4. Advanced multilevel degenerative changes of the cervical spine, not appreciably changed compared to the prior  study. 5. Right maxillary and sphenoid sinus disease. 6. Partial opacification within several mastoid air cells bilaterally. Correlate for signs and symptoms of mastoiditis. 7. Enlarged multinodular thyroid gland. Recommend thyroid ultrasound on a nonemergent basis (ref: J Am Coll Radiol. 2015 Feb;12(2): 143-50). Electronically Signed   By: Davina Poke D.O.   On: 09/29/2020 15:31   CT Cervical Spine Wo Contrast  Result Date: 09/29/2020 CLINICAL DATA:  Syncope, headache, head and neck trauma EXAM: CT HEAD WITHOUT CONTRAST CT CERVICAL SPINE WITHOUT CONTRAST TECHNIQUE: Multidetector CT imaging of the head and cervical spine was performed following the standard protocol without intravenous  contrast. Multiplanar CT image reconstructions of the cervical spine were also generated. COMPARISON:  01/02/2020, 07/09/2020 FINDINGS: CT HEAD FINDINGS Brain: No evidence of acute infarction, hemorrhage, hydrocephalus, extra-axial collection or mass lesion/mass effect. Moderate low-density changes within the periventricular and subcortical white matter compatible with chronic microvascular ischemic change. Mild diffuse cerebral volume loss. Vascular: Atherosclerotic calcifications involving the large vessels of the skull base. No unexpected hyperdense vessel. Skull: Normal. Negative for fracture or focal lesion. Sinuses/Orbits: Mucosal thickening within the visualized right maxillary sinus and right sphenoid sinus. Scattered partial opacification within several mastoid air cells bilaterally. Orbital structures within normal limits. Other: Negative for scalp hematoma. CT CERVICAL SPINE FINDINGS Alignment: Unchanged grade 1 anterolisthesis C4 on C5, C5 on C6, and C7 on T1. No facet joint dislocation. Dens and lateral masses remain aligned. Skull base and vertebrae: No acute fracture. No primary bone lesion or focal pathologic process. Soft tissues and spinal canal: No prevertebral fluid or swelling. No visible canal hematoma. Disc levels: Advanced multilevel degenerative changes within the cervical spine and visualized upper thoracic spine, not appreciably changed compared to the prior study. Upper chest: Centrilobular emphysema within the visualized lung apices. Other: Enlarged multinodular thyroid gland. Aortic and bilateral carotid atherosclerosis. IMPRESSION: 1. No acute intracranial findings. 2. Chronic microvascular ischemic changes and cerebral volume loss. 3. No evidence of acute fracture or traumatic subluxation of the cervical spine. 4. Advanced multilevel degenerative changes of the cervical spine, not appreciably changed compared to the prior study. 5. Right maxillary and sphenoid sinus disease. 6.  Partial opacification within several mastoid air cells bilaterally. Correlate for signs and symptoms of mastoiditis. 7. Enlarged multinodular thyroid gland. Recommend thyroid ultrasound on a nonemergent basis (ref: J Am Coll Radiol. 2015 Feb;12(2): 143-50). Electronically Signed   By: Davina Poke D.O.   On: 09/29/2020 15:31    Procedures Procedures (including critical care time)  Medications Ordered in ED Medications  sodium zirconium cyclosilicate (LOKELMA) packet 10 g (has no administration in time range)  sodium bicarbonate 50 mEq in dextrose 5 % 1,000 mL infusion (has no administration in time range)  sodium chloride 0.9 % bolus 1,000 mL (1,000 mLs Intravenous New Bag/Given 09/29/20 1728)    ED Course  I have reviewed the triage vital signs and the nursing notes.  Pertinent labs & imaging results that were available during my care of the patient were reviewed by me and considered in my medical decision making (see chart for details).    MDM Rules/Calculators/A&P                          79 year old female who presents to the ED today with near syncopal versus seizure-like activity.  Patient reports she was getting up from a seated position when she felt lightheaded and like she could pass out and called her husband from the  other room.  He was able to run to the room and catch her before she fell and brought her down to the ground gently.  He states that she had shaking all over in her arms and her legs however this lasted for only a few seconds.  She was able to communicate him through this event and told him that she did not want him to call the ambulance.  She was mentating appropriately.  No postictal state.  No urinary incontinence or trauma to the tongue.  No history of seizures.  On arrival to the ED vitals are stable.  Patient is afebrile, nontachycardic nontachypneic and appears to be in no acute distress.  She currently complains of a headache and neck pain that happened  after the fall.  On exam patient does have midline C-spine tenderness palpation as well as bilateral paracervical musculature tenderness palpation.  C-collar applied.  Given her age and complaint of headache and neck pain will plan for CT head and CT C-spine at this time.  Will check labs, EKG, orthostatics.  If all unremarkable we will plan to discharge home with PCP follow-up. Does not seem cardiac in nature to me.  Patient does have a history of elevated D-dimer.  It does appear patient has had multiple perfusion scans in the past with most recent one being 9/20 which was normal.   EKG without acute ischemic changes CBC without leukocytosis. Hgb stable at 9.7; improved from previous CMP with creatinine 2.81 worse from previous 2 months ago however while admitted in September it went up to about 5.0. Pt is not dialysis. Bicarb of 13 - will obtain VBG and potassium 6.0; will provide lokelma. Given these abnormalities pt will need admission.   Lab Results  Component Value Date   CREATININE 2.81 (H) 09/29/2020   CREATININE 1.86 (H) 07/10/2020   CREATININE 1.81 (H) 07/09/2020   Mag 2.5  CT Head and CT C spine negative. C collar removed.   Nephrology consulted - will see pt in the AM. Dr. Justin Mend recommends 50 mEq Sodium bicarb with D5W at 75 CC/hr, renal ultrasound, and urine osmolality   Discussed case with [facility] Dr. Reesa Chew who agrees to evaluate patient for admission.   This note was prepared using Dragon voice recognition software and may include unintentional dictation errors due to the inherent limitations of voice recognition software.   Final Clinical Impression(s) / ED Diagnoses Final diagnoses:  Near syncope  Hyperkalemia  AKI (acute kidney injury) [facility])    Rx / [place] Orders ED Discharge Orders    None       Eustaquio Maize, PA-C 09/29/20 [place], Ankit, MD 10/01/20 [phone]

## 2020-09-29 NOTE — ED Triage Notes (Signed)
Pt BIB [facility] from home c/o a syncopal episode. Pt states she got up to go to the bathroom and she passed out. Per husbands when she passed out she started jerking all over for a few seconds.. Pt states she has a headache 9/10. Pt denies any other pain or feelings dizzy.

## 2020-09-29 NOTE — ED Notes (Signed)
Cleaned pt up after BM and placed on purewick.

## 2020-09-29 NOTE — Consult Note (Signed)
Referring Provider: No ref. provider found Primary Care Physician:  [name], DO Primary Nephrologist:  Dr. Joelyn Oms  Reason for Consultation: Acute on chronic renal insufficiency, metabolic acidosis and hyperkalemia  HPI: This is a 79 year old lady with a history of hypertension hyperlipidemia COPD she has chronic kidney disease stage IV. She was last seen by Dr. Joelyn Oms in October 2021 at that time her creatinine was 2.2. She was also found to have a metabolic acidosis with a bicarbonate of 12 and hyperkalemia with potassium of 5.8. She has a documented history of renal artery stenosis. She presented to the emergency room with a syncopal spell and is being admitted for evaluation. She has a history of a renal vascular study done to evaluate renal artery stenosis in 2018 by Dr. Einar Gip she also has a history of peripheral vascular disease. In August 2021 she had acute kidney injury with a creatinine that increased to 7.4 mg/dL and improved on hydration.   In the emergency room she was found to have a creatinine that increased to 2.81. Previous creatinines of this would be 1.8 mg/dL. She had a bicarbonate of 13 and a potassium of 6.  Home medications: Bisoprolol 10 mg daily, Plavix 75 mg daily Voltaren gel 2 g 4 times daily as needed gabapentin 100 mg 3 times daily hydroxyzine 25 mg nightly Megace 40 mg twice daily Robaxin 500 mg 3 times daily nitroglycerin as needed multivitamins 1 daily Protonix 40 mg daily Crestor 40 mg nightly sodium bicarb 1.3 g twice daily, valproic acid to 50 mg nightly vitamin D 50,000 units weekly albuterol inhaler.  Sodium 136 potassium 3 6 chloride 110 bicarbonate 13 BUN 33 creatinine 2.8 glucose 128 calcium 9.1 magnesium 2.5 albumin 3.4 hemoglobin 9.7  Blood pressure 195/90 pulse 60 temperature 97.8 O2 sats 100% room air  Renal ultrasound 7.1 cm kidney on the right 9.3 cm kidney on the left bilateral renal atrophy.  Past Medical History:  Diagnosis Date  . Acute  respiratory failure with hypoxia ([place]) 11/01/2019  . Anxiety   . Arthritis   . Chest pain   . Chronic kidney disease   . Dyspnea   . Elevated troponin 12/13/2018  . Headache(784.0)   . Hyperlipidemia   . Hypertension   . Joint pain   . Leg pain   . Peripheral neuropathy   . Peripheral vascular disease [facility])     Past Surgical History:  Procedure Laterality Date  . ABDOMINAL ANGIOGRAM N/A 10/29/2011   Procedure: ABDOMINAL ANGIOGRAM;  Surgeon: Elam Dutch, MD;  Location: [facility] CATH LAB;  Service: Cardiovascular;  Laterality: N/A;  . BACK SURGERY    . FEMORAL-FEMORAL BYPASS GRAFT  08/31/2010  . FLEXIBLE SIGMOIDOSCOPY N/A 06/12/2019   Procedure: FLEXIBLE SIGMOIDOSCOPY;  Surgeon: Ladene Artist, MD;  Location: [facility] ENDOSCOPY;  Service: Endoscopy;  Laterality: N/A;  Patient had little bit to eat this morning so this will be unsedated.  Marland Kitchen JOINT REPLACEMENT Right 2012   Hip  . LOWER EXTREMITY ANGIOGRAPHY  06/14/2017   Procedure: Lower Extremity Angiography;  Surgeon: Adrian Prows, MD;  Location: [place] CV LAB;  Service: Cardiovascular;;  Bilateral limited angio performed  . RENAL ANGIOGRAPHY N/A 06/14/2017   Procedure: Renal Angiography;  Surgeon: Adrian Prows, MD;  Location: [place] CV LAB;  Service: Cardiovascular;  Laterality: N/A;  . TOTAL HIP ARTHROPLASTY     right    Prior to Admission medications   Medication Sig Start Date End Date Taking? Authorizing Provider  acetaminophen (TYLENOL) 325  MG tablet Take 2 tablets (650 mg total) by mouth every 6 (six) hours as needed for mild pain, moderate pain or fever (or Fever >/= 101). 06/16/19   Hongalgi, Lenis Dickinson, MD  albuterol (VENTOLIN HFA) 108 (90 Base) MCG/ACT inhaler Inhale 2 puffs into the lungs every 4 (four) hours as needed for wheezing or shortness of breath.     [provider]  amLODipine (NORVASC) 5 MG tablet Take 1 tablet (5 mg total) by mouth daily. Patient taking differently: Take 5 mg by mouth daily. 1.5 tablets  daily @bedtime  07/11/20 08/10/20  Terrilee Croak, MD  aspirin 81 MG EC tablet Take 1 tablet (81 mg total) by mouth daily. 01/09/19   Caren Griffins, MD  bisoprolol (ZEBETA) 10 MG tablet Take 1 tablet (10 mg total) by mouth daily. 09/25/20   Tanda Rockers, MD  Budeson-Glycopyrrol-Formoterol (BREZTRI AEROSPHERE) 160-9-4.8 MCG/ACT AERO Inhale 2 puffs into the lungs 2 (two) times daily. 09/25/20   Tanda Rockers, MD  Budeson-Glycopyrrol-Formoterol (BREZTRI AEROSPHERE) 160-9-4.8 MCG/ACT AERO Inhale 2 puffs into the lungs in the morning and at bedtime. 09/25/20   Tanda Rockers, MD  cloNIDine (CATAPRES) 0.2 MG tablet Take 0.2 mg by mouth at bedtime. 05/25/20   [provider]  clopidogrel (PLAVIX) 75 MG tablet Take 1 tablet (75 mg total) by mouth daily. 01/09/19   Caren Griffins, MD  diclofenac Sodium (VOLTAREN) 1 % GEL Apply 2 g topically 4 (four) times daily as needed (pain).  01/15/20   [provider]  dicyclomine (BENTYL) 20 MG tablet Take 20 mg by mouth 3 (three) times daily. 04/01/20   [provider]  donepezil (ARICEPT) 5 MG tablet Take 5 mg by mouth daily.    [provider]  DULoxetine (CYMBALTA) 30 MG capsule Take 30 mg by mouth daily.    [provider]  feeding supplement, ENSURE ENLIVE, (ENSURE ENLIVE) LIQD Take 237 mLs by mouth 2 (two) times daily between meals. 06/16/19   Hongalgi, Lenis Dickinson, MD  Ferrous Sulfate (IRON HIGH-POTENCY) 325 MG TABS Take 325 mg by mouth daily with breakfast. 06/30/19   Hongalgi, Lenis Dickinson, MD  gabapentin (NEURONTIN) 100 MG capsule Take 100 mg by mouth 3 (three) times daily. 10/25/19   [provider]  hydrOXYzine (ATARAX/VISTARIL) 25 MG tablet Take 25 mg by mouth at bedtime as needed (sleep).  05/12/20   [provider]  magnesium oxide (MAG-OX) 400 MG tablet Take 400 mg by mouth daily.    [provider]  megestrol (MEGACE) 40 MG tablet Take 40 mg by mouth 2 (two) times daily. 05/12/20   [provider]  methocarbamol (ROBAXIN) 500 MG tablet Take 500 mg by mouth 3 (three) times daily as needed (back spasms).  05/12/20   [provider]  Multiple Vitamin (MULTIVITAMIN WITH MINERALS) TABS tablet Take 1 tablet by mouth daily. 06/17/19   Hongalgi, Lenis Dickinson, MD  nitroGLYCERIN (NITROSTAT) 0.4 MG SL tablet Place 1 tablet (0.4 mg total) under the tongue every 5 (five) minutes as needed for chest pain. 01/07/20   Allie Bossier, MD  ondansetron (ZOFRAN ODT) 4 MG disintegrating tablet Take 1 tablet (4 mg total) by mouth every 4 (four) hours as needed for nausea or vomiting. 06/09/20   Charlesetta Shanks, MD  pantoprazole (PROTONIX) 40 MG tablet Take 1 tablet (40 mg total) by mouth daily. 06/16/19   Hongalgi, Lenis Dickinson, MD  rosuvastatin (CRESTOR) 40 MG tablet Take 1 tablet (40 mg total) by  mouth at bedtime. Patient taking differently: Take 40 mg by mouth daily. 06/16/19   Hongalgi, Lenis Dickinson, MD  sodium bicarbonate 650 MG tablet Take 2 tablets (1,300 mg total) by mouth 2 (two) times daily. 01/07/20   Allie Bossier, MD  valproic acid (DEPAKENE) 250 MG capsule Take 250 mg by mouth at bedtime. 05/12/20   [provider]  Vitamin D, Ergocalciferol, (DRISDOL) 1.25 MG (50000 UNIT) CAPS capsule Take 50,000 Units by mouth every 7 (seven) days. mondays    [provider]    Current Facility-Administered Medications  Medication Dose Route Frequency Provider Last Rate Last Admin  . acetaminophen (TYLENOL) tablet 650 mg  650 mg Oral Q6H PRN Lorella Nimrod, MD       Or  . acetaminophen (TYLENOL) suppository 650 mg  650 mg Rectal Q6H PRN Lorella Nimrod, MD      . amLODipine (NORVASC) tablet 5 mg  5 mg Oral Daily Lorella Nimrod, MD      . aspirin EC tablet 81 mg  81 mg Oral Daily Lorella Nimrod, MD      . Budeson-Glycopyrrol-Formoterol 160-9-4.8 MCG/ACT AERO 2 puff  2 puff Inhalation BID Lorella Nimrod, MD      . cloNIDine (CATAPRES) tablet 0.2 mg  0.2 mg Oral QHS Lorella Nimrod, MD      .  donepezil (ARICEPT) tablet 5 mg  5 mg Oral Daily Lorella Nimrod, MD      . DULoxetine (CYMBALTA) DR capsule 30 mg  30 mg Oral Daily Lorella Nimrod, MD      . heparin injection 5,000 Units  5,000 Units Subcutaneous Q8H Lorella Nimrod, MD      . ondansetron (ZOFRAN) tablet 4 mg  4 mg Oral Q6H PRN Lorella Nimrod, MD       Or  . ondansetron (ZOFRAN) injection 4 mg  4 mg Intravenous Q6H PRN Lorella Nimrod, MD      . polyethylene glycol (MIRALAX / GLYCOLAX) packet 17 g  17 g Oral Daily PRN Lorella Nimrod, MD      . sodium bicarbonate 50 mEq in dextrose 5 % 1,000 mL infusion   Intravenous Continuous Eustaquio Maize, PA-C       Current Outpatient Medications  Medication Sig Dispense Refill  . acetaminophen (TYLENOL) 325 MG tablet Take 2 tablets (650 mg total) by mouth every 6 (six) hours as needed for mild pain, moderate pain or fever (or Fever >/= 101).    Marland Kitchen albuterol (VENTOLIN HFA) 108 (90 Base) MCG/ACT inhaler Inhale 2 puffs into the lungs every 4 (four) hours as needed for wheezing or shortness of breath.     Marland Kitchen amLODipine (NORVASC) 5 MG tablet Take 1 tablet (5 mg total) by mouth daily. (Patient taking differently: Take 5 mg by mouth daily. 1.5 tablets daily @bedtime ) 30 tablet 0  . aspirin 81 MG EC tablet Take 1 tablet (81 mg total) by mouth daily. 30 tablet 1  . bisoprolol (ZEBETA) 10 MG tablet Take 1 tablet (10 mg total) by mouth daily. 30 tablet 11  . Budeson-Glycopyrrol-Formoterol (BREZTRI AEROSPHERE) 160-9-4.8 MCG/ACT AERO Inhale 2 puffs into the lungs 2 (two) times daily. 10.7 g 11  . Budeson-Glycopyrrol-Formoterol (BREZTRI AEROSPHERE) 160-9-4.8 MCG/ACT AERO Inhale 2 puffs into the lungs in the morning and at bedtime. 10.7 g 0  . cloNIDine (CATAPRES) 0.2 MG tablet Take 0.2 mg by mouth at bedtime.    . clopidogrel (PLAVIX) 75 MG tablet Take 1 tablet (75 mg total) by mouth daily. 30 tablet 1  .  diclofenac Sodium (VOLTAREN) 1 % GEL Apply 2 g topically 4 (four) times daily as needed (pain).     Marland Kitchen  dicyclomine (BENTYL) 20 MG tablet Take 20 mg by mouth 3 (three) times daily.    Marland Kitchen donepezil (ARICEPT) 5 MG tablet Take 5 mg by mouth daily.    . DULoxetine (CYMBALTA) 30 MG capsule Take 30 mg by mouth daily.    . feeding supplement, ENSURE ENLIVE, (ENSURE ENLIVE) LIQD Take 237 mLs by mouth 2 (two) times daily between meals. 237 mL 12  . Ferrous Sulfate (IRON HIGH-POTENCY) 325 MG TABS Take 325 mg by mouth daily with breakfast.    . gabapentin (NEURONTIN) 100 MG capsule Take 100 mg by mouth 3 (three) times daily.    . hydrOXYzine (ATARAX/VISTARIL) 25 MG tablet Take 25 mg by mouth at bedtime as needed (sleep).     . magnesium oxide (MAG-OX) 400 MG tablet Take 400 mg by mouth daily.    . megestrol (MEGACE) 40 MG tablet Take 40 mg by mouth 2 (two) times daily.    . methocarbamol (ROBAXIN) 500 MG tablet Take 500 mg by mouth 3 (three) times daily as needed (back spasms).     . Multiple Vitamin (MULTIVITAMIN WITH MINERALS) TABS tablet Take 1 tablet by mouth daily.    . nitroGLYCERIN (NITROSTAT) 0.4 MG SL tablet Place 1 tablet (0.4 mg total) under the tongue every 5 (five) minutes as needed for chest pain. 30 tablet 0  . ondansetron (ZOFRAN ODT) 4 MG disintegrating tablet Take 1 tablet (4 mg total) by mouth every 4 (four) hours as needed for nausea or vomiting. 20 tablet 0  . pantoprazole (PROTONIX) 40 MG tablet Take 1 tablet (40 mg total) by mouth daily. 30 tablet 0  . rosuvastatin (CRESTOR) 40 MG tablet Take 1 tablet (40 mg total) by mouth at bedtime. (Patient taking differently: Take 40 mg by mouth daily.)    . sodium bicarbonate 650 MG tablet Take 2 tablets (1,300 mg total) by mouth 2 (two) times daily. 60 tablet 0  . valproic acid (DEPAKENE) 250 MG capsule Take 250 mg by mouth at bedtime.    . Vitamin D, Ergocalciferol, (DRISDOL) 1.25 MG (50000 UNIT) CAPS capsule Take 50,000 Units by mouth every 7 (seven) days. mondays      Allergies as of 09/29/2020  . (No Known Allergies)    Family History   Problem Relation Age of Onset  . Heart attack Mother   . Heart disease Mother   . Hyperlipidemia Mother   . Hypertension Mother   . Heart attack Father   . Heart disease Father        Before age 3  . Hyperlipidemia Father   . Hypertension Father   . Cancer Sister        brain tumor  . Aneurysm Brother        brain  . Colon cancer Neg Hx   . Liver cancer Neg Hx     Social History   Socioeconomic History  . Marital status: Single    Spouse name: Not on file  . Number of children: 2  . Years of education: 11th  . Highest education level: Not on file  Occupational History  . Occupation: Retired  Tobacco Use  . Smoking status: Current Every Day Smoker    Packs/day: 1.00    Years: 27.00    Pack years: 27.00    Types: Cigarettes  . Smokeless tobacco: Never Used  . Tobacco comment: smokes  1 pack per day 09/25/2020  Vaping Use  . Vaping Use: Never used  Substance and Sexual Activity  . Alcohol use: No  . Drug use: No  . Sexual activity: Not on file  Other Topics Concern  . Not on file  Social History Narrative    Lives at home with her brother.   Right-handed.   Occasional caffeine use.   Social Determinants of Health   Financial Resource Strain: Not on file  Food Insecurity: Not on file  Transportation Needs: Not on file  Physical Activity: Not on file  Stress: Not on file  Social Connections: Not on file  Intimate Partner Violence: Not on file    Review of Systems: Gen: Denies any fever, chills, sweats, anorexia HEENT: No visual complaints, No history of Retinopathy. Normal external appearance No Epistaxis or Sore throat. No sinusitis.   CV: Denies chest pain, angina, palpitations, positive syncope, orthopnea, PND, peripheral edema, and claudication. Resp: Denies dyspnea at rest, dyspnea with exercise, cough, sputum, wheezing, coughing up blood, and pleurisy. GI: Denies vomiting blood, jaundice, and fecal incontinence.   Denies dysphagia or odynophagia. GU  : Denies urinary burning, blood in urine, urinary frequency, urinary hesitancy, nocturnal urination, and urinary incontinence.  No renal calculi. MS: Denies joint pain, limitation of movement, and swelling, stiffness, low back pain, extremity pain. Denies muscle weakness, cramps, atrophy.  No use of non steroidal antiinflammatory drugs. Derm: Denies rash, itching, dry skin, hives, moles, warts, or unhealing ulcers.  Psych: History of memory loss Heme: Denies bruising, bleeding, and enlarged lymph nodes. Neuro: No headache.  No diplopia. No dysarthria.  No dysphasia.  No history of CVA.  No Seizures. No paresthesias.  No weakness. Endocrine No DM.  No Thyroid disease.  No Adrenal disease.  Physical Exam: Vital signs in last 24 hours: Temp:  [97.8 F (36.6 C)] 97.8 F (36.6 C) (12/13 1356) Pulse Rate:  [54-67] 58 (12/13 1815) Resp:  [10-17] 14 (12/13 1815) BP: (150-195)/(87-99) 195/90 (12/13 1815) SpO2:  [99 %-100 %] 100 % (12/13 1815)   General: Ill-appearing elderly lady in distress Head:  Normocephalic and atraumatic. Eyes:  Sclera clear, no icterus.   Conjunctiva pink. Ears:  Normal auditory acuity. Nose:  No deformity, discharge,  or lesions. Mouth:  No deformity or lesions, dentition normal. Neck:  Supple; no masses or thyromegaly. JVP not elevated Lungs:  Clear throughout to auscultation.   No wheezes, crackles, or rhonchi. No acute distress. Heart:  Regular rate and rhythm; no murmurs, clicks, rubs,  or gallops. Abdomen:  Soft, nontender and nondistended. No masses, hepatosplenomegaly or hernias noted. Normal bowel sounds, without guarding, and without rebound.   Msk:  Symmetrical without gross deformities. Normal posture. Pulses: Diminished peripheral pulses Extremities:  Without clubbing or edema. Neurologic:  Alert and  oriented x4;  grossly normal neurologically. Skin:  Intact without significant lesions or rashes.   Intake/Output from previous day: No intake/output data  recorded. Intake/Output this shift: No intake/output data recorded.  Lab Results: Recent Labs    09/29/20 1406  WBC 8.8  HGB 9.7*  HCT 32.6*  PLT 438*   BMET Recent Labs    09/29/20 1406  NA 136  K 6.0*  CL 110  CO2 13*  GLUCOSE 128*  BUN 53*  CREATININE 2.81*  CALCIUM 9.1   LFT Recent Labs    09/29/20 1406  PROT 6.7  ALBUMIN 3.4*  AST 17  ALT 14  ALKPHOS 77  BILITOT 0.4   PT/INR No  results for input(s): LABPROT, INR in the last 72 hours. Hepatitis Panel No results for input(s): HEPBSAG, HCVAB, HEPAIGM, HEPBIGM in the last 72 hours.  Studies/Results: CT Head Wo Contrast  Result Date: 09/29/2020 CLINICAL DATA:  Syncope, headache, head and neck trauma EXAM: CT HEAD WITHOUT CONTRAST CT CERVICAL SPINE WITHOUT CONTRAST TECHNIQUE: Multidetector CT imaging of the head and cervical spine was performed following the standard protocol without intravenous contrast. Multiplanar CT image reconstructions of the cervical spine were also generated. COMPARISON:  01/02/2020, 07/09/2020 FINDINGS: CT HEAD FINDINGS Brain: No evidence of acute infarction, hemorrhage, hydrocephalus, extra-axial collection or mass lesion/mass effect. Moderate low-density changes within the periventricular and subcortical white matter compatible with chronic microvascular ischemic change. Mild diffuse cerebral volume loss. Vascular: Atherosclerotic calcifications involving the large vessels of the skull base. No unexpected hyperdense vessel. Skull: Normal. Negative for fracture or focal lesion. Sinuses/Orbits: Mucosal thickening within the visualized right maxillary sinus and right sphenoid sinus. Scattered partial opacification within several mastoid air cells bilaterally. Orbital structures within normal limits. Other: Negative for scalp hematoma. CT CERVICAL SPINE FINDINGS Alignment: Unchanged grade 1 anterolisthesis C4 on C5, C5 on C6, and C7 on T1. No facet joint dislocation. Dens and lateral masses remain  aligned. Skull base and vertebrae: No acute fracture. No primary bone lesion or focal pathologic process. Soft tissues and spinal canal: No prevertebral fluid or swelling. No visible canal hematoma. Disc levels: Advanced multilevel degenerative changes within the cervical spine and visualized upper thoracic spine, not appreciably changed compared to the prior study. Upper chest: Centrilobular emphysema within the visualized lung apices. Other: Enlarged multinodular thyroid gland. Aortic and bilateral carotid atherosclerosis. IMPRESSION: 1. No acute intracranial findings. 2. Chronic microvascular ischemic changes and cerebral volume loss. 3. No evidence of acute fracture or traumatic subluxation of the cervical spine. 4. Advanced multilevel degenerative changes of the cervical spine, not appreciably changed compared to the prior study. 5. Right maxillary and sphenoid sinus disease. 6. Partial opacification within several mastoid air cells bilaterally. Correlate for signs and symptoms of mastoiditis. 7. Enlarged multinodular thyroid gland. Recommend thyroid ultrasound on a nonemergent basis (ref: J Am Coll Radiol. 2015 Feb;12(2): 143-50). Electronically Signed   By: Davina Poke D.O.   On: 09/29/2020 15:31   CT Cervical Spine Wo Contrast  Result Date: 09/29/2020 CLINICAL DATA:  Syncope, headache, head and neck trauma EXAM: CT HEAD WITHOUT CONTRAST CT CERVICAL SPINE WITHOUT CONTRAST TECHNIQUE: Multidetector CT imaging of the head and cervical spine was performed following the standard protocol without intravenous contrast. Multiplanar CT image reconstructions of the cervical spine were also generated. COMPARISON:  01/02/2020, 07/09/2020 FINDINGS: CT HEAD FINDINGS Brain: No evidence of acute infarction, hemorrhage, hydrocephalus, extra-axial collection or mass lesion/mass effect. Moderate low-density changes within the periventricular and subcortical white matter compatible with chronic microvascular ischemic  change. Mild diffuse cerebral volume loss. Vascular: Atherosclerotic calcifications involving the large vessels of the skull base. No unexpected hyperdense vessel. Skull: Normal. Negative for fracture or focal lesion. Sinuses/Orbits: Mucosal thickening within the visualized right maxillary sinus and right sphenoid sinus. Scattered partial opacification within several mastoid air cells bilaterally. Orbital structures within normal limits. Other: Negative for scalp hematoma. CT CERVICAL SPINE FINDINGS Alignment: Unchanged grade 1 anterolisthesis C4 on C5, C5 on C6, and C7 on T1. No facet joint dislocation. Dens and lateral masses remain aligned. Skull base and vertebrae: No acute fracture. No primary bone lesion or focal pathologic process. Soft tissues and spinal canal: No prevertebral fluid or swelling. No visible  canal hematoma. Disc levels: Advanced multilevel degenerative changes within the cervical spine and visualized upper thoracic spine, not appreciably changed compared to the prior study. Upper chest: Centrilobular emphysema within the visualized lung apices. Other: Enlarged multinodular thyroid gland. Aortic and bilateral carotid atherosclerosis. IMPRESSION: 1. No acute intracranial findings. 2. Chronic microvascular ischemic changes and cerebral volume loss. 3. No evidence of acute fracture or traumatic subluxation of the cervical spine. 4. Advanced multilevel degenerative changes of the cervical spine, not appreciably changed compared to the prior study. 5. Right maxillary and sphenoid sinus disease. 6. Partial opacification within several mastoid air cells bilaterally. Correlate for signs and symptoms of mastoiditis. 7. Enlarged multinodular thyroid gland. Recommend thyroid ultrasound on a nonemergent basis (ref: J Am Coll Radiol. 2015 Feb;12(2): 143-50). Electronically Signed   By: Davina Poke D.O.   On: 09/29/2020 15:31   US Renal  Result Date: 09/29/2020 CLINICAL DATA:  79 year old female  with acute renal insufficiency. EXAM: RENAL / URINARY TRACT ULTRASOUND COMPLETE COMPARISON:  Renal ultrasound dated 09/17/2019. FINDINGS: Right Kidney: Renal measurements: 7.1 x 3.3 x 3.2 cm = volume: 39 mL. The right kidney is atrophic and echogenic in keeping with chronic kidney disease. No hydronephrosis or shadowing stone. There is a subcentimeter upper pole cyst. Left Kidney: Renal measurements: 9.3 x 5.7 x 4.3 cm = volume: 120 mL. The left kidney is echogenic. No hydronephrosis or shadowing stone. There is a 1.4 cm interpolar cyst. Bladder: Appears normal for degree of bladder distention. Other: None. IMPRESSION: 1. Echogenic kidneys in keeping with chronic kidney disease. No hydronephrosis or shadowing stone. 2. Stable right renal atrophy. 3. Bilateral renal cysts. Electronically Signed   By: Anner Crete M.D.   On: 09/29/2020 19:07    Assessment/Plan:  Acute on chronic kidney disease. Baseline creatinine somewhere between 1.8 - 2.2 mg/dL is increased to 2.8 mg/dL. Not a good candidate for dialysis followed by Dr. Joelyn Oms at [facility] Will send urinalysis but doubt this represents acute glomerular necrosis or acute interstitial nephritis. There is no evidence of obstruction on renal ultrasound. She does have significant vascular disease with documented renal artery stenosis. Her kidney function will be very sensitive to volume depletion.  Hypertension we will continue to follow continue with IV fluids recommend D5W with 3 A of bicarb at 75 cc an hour  Metabolic acidosis we will treat with normal bicarbonate 75 cc an hour  Continue to avoid nephrotoxins. No NSAIDs, no ACE inhibitor's no ARB's no vancomycin/Zosyn combination. She may be sensitive to narcotics so avoid long-acting narcotics with active metabolite such as morphine.  Hyperkalemia. Lokelma 10 g 3 times a day x3 doses. Correction of metabolic acidosis low potassium diet   LOS: 0 Sherril Croon @TODAY @7 :59 PM

## 2020-09-29 NOTE — H&P (Signed)
History and Physical    Alyssa [name]:[id number] DOB: 07-May-1941 DOA: 09/29/2020  PCP: [name], DO   Patient coming from: Home  I have personally briefly reviewed patient's old medical records in [place]  Chief Complaint: Syncope  HPI: Alyssa [name] is a 79 y.o. female with medical history significant of hypertension, hyperlipidemia, COPD, CKD stage IV, CAD and dementia came to ED after having a syncopal episode. Per patient she was getting up to go to bathroom, she felt lightheaded and passed out. Husband was able to catch her before falling as she told him that she is going to pass out. Husband told the EMS he noticed some jerking movements all over her body for couple of seconds. Patient has no history of seizure disorder. No confusion, tongue bite or incontinence. Denies any chest pain or shortness of breath.  Patient denies any recent illness or upper respiratory symptoms.  No fever or chills.  He does has increased urinary frequency and urgency but denies any dysuria or lower abdominal pain. Per patient her appetite is good and she is eating and drinking okay but appears dry.  Unable to reach husband to verify.  Per patient her husband mostly take care of the household and her medications but she is independent for her personal grooming.  She is walking with the help of walker.   ED Course: On arrival patient was hemodynamically stable, blood pressure of 150/89, saturating well, labs positive for bicarb of 13 and potassium of 6, creatinine of 2.81 with baseline around 1.8, CT head without any acute abnormality, EKG without any peaked T waves, no acute changes. Nephrology was also consulted by ED provider.  Review of Systems: As per HPI otherwise 10 point review of systems negative.   Past Medical History:  Diagnosis Date  . Acute respiratory failure with hypoxia ([place]) 11/01/2019  . Anxiety   . Arthritis   . Chest pain   . Chronic kidney disease   .  Dyspnea   . Elevated troponin 12/13/2018  . Headache(784.0)   . Hyperlipidemia   . Hypertension   . Joint pain   . Leg pain   . Peripheral neuropathy   . Peripheral vascular disease [facility])     Past Surgical History:  Procedure Laterality Date  . ABDOMINAL ANGIOGRAM N/A 10/29/2011   Procedure: ABDOMINAL ANGIOGRAM;  Surgeon: Elam Dutch, MD;  Location: [facility] CATH LAB;  Service: Cardiovascular;  Laterality: N/A;  . BACK SURGERY    . FEMORAL-FEMORAL BYPASS GRAFT  08/31/2010  . FLEXIBLE SIGMOIDOSCOPY N/A 06/12/2019   Procedure: FLEXIBLE SIGMOIDOSCOPY;  Surgeon: Ladene Artist, MD;  Location: [facility] ENDOSCOPY;  Service: Endoscopy;  Laterality: N/A;  Patient had little bit to eat this morning so this will be unsedated.  Marland Kitchen JOINT REPLACEMENT Right 2012   Hip  . LOWER EXTREMITY ANGIOGRAPHY  06/14/2017   Procedure: Lower Extremity Angiography;  Surgeon: Adrian Prows, MD;  Location: [place] CV LAB;  Service: Cardiovascular;;  Bilateral limited angio performed  . RENAL ANGIOGRAPHY N/A 06/14/2017   Procedure: Renal Angiography;  Surgeon: Adrian Prows, MD;  Location: [place] CV LAB;  Service: Cardiovascular;  Laterality: N/A;  . TOTAL HIP ARTHROPLASTY     right     reports that she has been smoking cigarettes. She has a 27.00 pack-year smoking history. She has never used smokeless tobacco. She reports that she does not drink alcohol and does not use drugs.  No Known Allergies  Family History  Problem  Relation Age of Onset  . Heart attack Mother   . Heart disease Mother   . Hyperlipidemia Mother   . Hypertension Mother   . Heart attack Father   . Heart disease Father        Before age 59  . Hyperlipidemia Father   . Hypertension Father   . Cancer Sister        brain tumor  . Aneurysm Brother        brain  . Colon cancer Neg Hx   . Liver cancer Neg Hx     Prior to Admission medications   Medication Sig Start Date End Date Taking? Authorizing Provider  acetaminophen (TYLENOL) 325  MG tablet Take 2 tablets (650 mg total) by mouth every 6 (six) hours as needed for mild pain, moderate pain or fever (or Fever >/= 101). 06/16/19   Hongalgi, Lenis Dickinson, MD  albuterol (VENTOLIN HFA) 108 (90 Base) MCG/ACT inhaler Inhale 2 puffs into the lungs every 4 (four) hours as needed for wheezing or shortness of breath.     [provider]  amLODipine (NORVASC) 5 MG tablet Take 1 tablet (5 mg total) by mouth daily. Patient taking differently: Take 5 mg by mouth daily. 1.5 tablets daily @bedtime  07/11/20 08/10/20  Terrilee Croak, MD  aspirin 81 MG EC tablet Take 1 tablet (81 mg total) by mouth daily. 01/09/19   Caren Griffins, MD  bisoprolol (ZEBETA) 10 MG tablet Take 1 tablet (10 mg total) by mouth daily. 09/25/20   Tanda Rockers, MD  Budeson-Glycopyrrol-Formoterol (BREZTRI AEROSPHERE) 160-9-4.8 MCG/ACT AERO Inhale 2 puffs into the lungs 2 (two) times daily. 09/25/20   Tanda Rockers, MD  Budeson-Glycopyrrol-Formoterol (BREZTRI AEROSPHERE) 160-9-4.8 MCG/ACT AERO Inhale 2 puffs into the lungs in the morning and at bedtime. 09/25/20   Tanda Rockers, MD  cloNIDine (CATAPRES) 0.2 MG tablet Take 0.2 mg by mouth at bedtime. 05/25/20   [provider]  clopidogrel (PLAVIX) 75 MG tablet Take 1 tablet (75 mg total) by mouth daily. 01/09/19   Caren Griffins, MD  diclofenac Sodium (VOLTAREN) 1 % GEL Apply 2 g topically 4 (four) times daily as needed (pain).  01/15/20   [provider]  dicyclomine (BENTYL) 20 MG tablet Take 20 mg by mouth 3 (three) times daily. 04/01/20   [provider]  donepezil (ARICEPT) 5 MG tablet Take 5 mg by mouth daily.    [provider]  DULoxetine (CYMBALTA) 30 MG capsule Take 30 mg by mouth daily.    [provider]  feeding supplement, ENSURE ENLIVE, (ENSURE ENLIVE) LIQD Take 237 mLs by mouth 2 (two) times daily between meals. 06/16/19   Hongalgi, Lenis Dickinson, MD  Ferrous Sulfate (IRON HIGH-POTENCY) 325 MG TABS Take 325 mg by mouth  daily with breakfast. 06/30/19   Hongalgi, Lenis Dickinson, MD  gabapentin (NEURONTIN) 100 MG capsule Take 100 mg by mouth 3 (three) times daily. 10/25/19   [provider]  hydrOXYzine (ATARAX/VISTARIL) 25 MG tablet Take 25 mg by mouth at bedtime as needed (sleep).  05/12/20   [provider]  magnesium oxide (MAG-OX) 400 MG tablet Take 400 mg by mouth daily.    [provider]  megestrol (MEGACE) 40 MG tablet Take 40 mg by mouth 2 (two) times daily. 05/12/20   [provider]  methocarbamol (ROBAXIN) 500 MG tablet Take 500 mg by mouth 3 (three) times daily as needed (back spasms).  05/12/20   [provider]  Multiple  Vitamin (MULTIVITAMIN WITH MINERALS) TABS tablet Take 1 tablet by mouth daily. 06/17/19   Hongalgi, Lenis Dickinson, MD  nitroGLYCERIN (NITROSTAT) 0.4 MG SL tablet Place 1 tablet (0.4 mg total) under the tongue every 5 (five) minutes as needed for chest pain. 01/07/20   Allie Bossier, MD  ondansetron (ZOFRAN ODT) 4 MG disintegrating tablet Take 1 tablet (4 mg total) by mouth every 4 (four) hours as needed for nausea or vomiting. 06/09/20   Charlesetta Shanks, MD  pantoprazole (PROTONIX) 40 MG tablet Take 1 tablet (40 mg total) by mouth daily. 06/16/19   Hongalgi, Lenis Dickinson, MD  rosuvastatin (CRESTOR) 40 MG tablet Take 1 tablet (40 mg total) by mouth at bedtime. Patient taking differently: Take 40 mg by mouth daily. 06/16/19   Hongalgi, Lenis Dickinson, MD  sodium bicarbonate 650 MG tablet Take 2 tablets (1,300 mg total) by mouth 2 (two) times daily. 01/07/20   Allie Bossier, MD  valproic acid (DEPAKENE) 250 MG capsule Take 250 mg by mouth at bedtime. 05/12/20   [provider]  Vitamin D, Ergocalciferol, (DRISDOL) 1.25 MG (50000 UNIT) CAPS capsule Take 50,000 Units by mouth every 7 (seven) days. mondays    [provider]    Physical Exam: Vitals:   09/29/20 1700 09/29/20 1715 09/29/20 1800 09/29/20 1815  BP: (!) 182/87 (!) 173/99 (!) 179/87 (!) 195/90   Pulse: (!) 54 62 (!) 59 (!) 58  Resp: 10 15 16 14   Temp:      TempSrc:      SpO2: 100% 99% 100% 100%    General: Vital signs reviewed.  Patient is frail elderly lady, in no acute distress and cooperative with exam.  Head: Normocephalic and atraumatic. Eyes: EOMI, conjunctivae normal, no scleral icterus.  ENMT: Mucous membranes are dry. Neck: Supple, trachea midline Cardiovascular: RRR, S1 normal, S2 normal, no murmurs, gallops, or rubs. Pulmonary/Chest: Clear to auscultation bilaterally, no wheezes, rales, or rhonchi. Abdominal: Soft, non-tender, non-distended, BS +, Extremities: No lower extremity edema bilaterally,  pulses symmetric and intact bilaterally. No cyanosis or clubbing. Neurological: A&O , Strength is normal and symmetric bilaterally, cranial nerve II-XII are grossly intact, no focal motor deficit, sensory intact to light touch bilaterally. Marland Kitchen Psychiatric: Normal mood and affect.   Labs on Admission: I have personally reviewed following labs and imaging studies  CBC: Recent Labs  Lab 09/29/20 1406  WBC 8.8  NEUTROABS 5.7  HGB 9.7*  HCT 32.6*  MCV 89.6  PLT 419*   Basic Metabolic Panel: Recent Labs  Lab 09/29/20 1406  NA 136  K 6.0*  CL 110  CO2 13*  GLUCOSE 128*  BUN 53*  CREATININE 2.81*  CALCIUM 9.1  MG 2.5*   GFR: Estimated Creatinine Clearance: 11.8 mL/min (A) (by C-G formula based on SCr of 2.81 mg/dL (H)). Liver Function Tests: Recent Labs  Lab 09/29/20 1406  AST 17  ALT 14  ALKPHOS 77  BILITOT 0.4  PROT 6.7  ALBUMIN 3.4*   No results for input(s): LIPASE, AMYLASE in the last 168 hours. No results for input(s): AMMONIA in the last 168 hours. Coagulation Profile: No results for input(s): INR, PROTIME in the last 168 hours. Cardiac Enzymes: No results for input(s): CKTOTAL, CKMB, CKMBINDEX, TROPONINI in the last 168 hours. BNP (last 3 results) No results for input(s): PROBNP in the last 8760 hours. HbA1C: No results for input(s):  HGBA1C in the last 72 hours. CBG: No results for input(s): GLUCAP in the last 168 hours. Lipid Profile:  No results for input(s): CHOL, HDL, LDLCALC, TRIG, CHOLHDL, LDLDIRECT in the last 72 hours. Thyroid Function Tests: No results for input(s): TSH, T4TOTAL, FREET4, T3FREE, THYROIDAB in the last 72 hours. Anemia Panel: No results for input(s): VITAMINB12, FOLATE, FERRITIN, TIBC, IRON, RETICCTPCT in the last 72 hours. Urine analysis:    Component Value Date/Time   COLORURINE YELLOW 07/06/2020 1856   APPEARANCEUR HAZY (A) 07/06/2020 1856   LABSPEC 1.012 07/06/2020 1856   PHURINE 5.0 07/06/2020 1856   GLUCOSEU NEGATIVE 07/06/2020 1856   HGBUR NEGATIVE 07/06/2020 1856   BILIRUBINUR NEGATIVE 07/06/2020 1856   KETONESUR NEGATIVE 07/06/2020 1856   PROTEINUR 100 (A) 07/06/2020 1856   UROBILINOGEN 0.2 08/24/2015 1737   NITRITE NEGATIVE 07/06/2020 1856   LEUKOCYTESUR LARGE (A) 07/06/2020 1856    Radiological Exams on Admission: CT Head Wo Contrast  Result Date: 09/29/2020 CLINICAL DATA:  Syncope, headache, head and neck trauma EXAM: CT HEAD WITHOUT CONTRAST CT CERVICAL SPINE WITHOUT CONTRAST TECHNIQUE: Multidetector CT imaging of the head and cervical spine was performed following the standard protocol without intravenous contrast. Multiplanar CT image reconstructions of the cervical spine were also generated. COMPARISON:  01/02/2020, 07/09/2020 FINDINGS: CT HEAD FINDINGS Brain: No evidence of acute infarction, hemorrhage, hydrocephalus, extra-axial collection or mass lesion/mass effect. Moderate low-density changes within the periventricular and subcortical white matter compatible with chronic microvascular ischemic change. Mild diffuse cerebral volume loss. Vascular: Atherosclerotic calcifications involving the large vessels of the skull base. No unexpected hyperdense vessel. Skull: Normal. Negative for fracture or focal lesion. Sinuses/Orbits: Mucosal thickening within the visualized right  maxillary sinus and right sphenoid sinus. Scattered partial opacification within several mastoid air cells bilaterally. Orbital structures within normal limits. Other: Negative for scalp hematoma. CT CERVICAL SPINE FINDINGS Alignment: Unchanged grade 1 anterolisthesis C4 on C5, C5 on C6, and C7 on T1. No facet joint dislocation. Dens and lateral masses remain aligned. Skull base and vertebrae: No acute fracture. No primary bone lesion or focal pathologic process. Soft tissues and spinal canal: No prevertebral fluid or swelling. No visible canal hematoma. Disc levels: Advanced multilevel degenerative changes within the cervical spine and visualized upper thoracic spine, not appreciably changed compared to the prior study. Upper chest: Centrilobular emphysema within the visualized lung apices. Other: Enlarged multinodular thyroid gland. Aortic and bilateral carotid atherosclerosis. IMPRESSION: 1. No acute intracranial findings. 2. Chronic microvascular ischemic changes and cerebral volume loss. 3. No evidence of acute fracture or traumatic subluxation of the cervical spine. 4. Advanced multilevel degenerative changes of the cervical spine, not appreciably changed compared to the prior study. 5. Right maxillary and sphenoid sinus disease. 6. Partial opacification within several mastoid air cells bilaterally. Correlate for signs and symptoms of mastoiditis. 7. Enlarged multinodular thyroid gland. Recommend thyroid ultrasound on a nonemergent basis (ref: J Am Coll Radiol. 2015 Feb;12(2): 143-50). Electronically Signed   By: Davina Poke D.O.   On: 09/29/2020 15:31   CT Cervical Spine Wo Contrast  Result Date: 09/29/2020 CLINICAL DATA:  Syncope, headache, head and neck trauma EXAM: CT HEAD WITHOUT CONTRAST CT CERVICAL SPINE WITHOUT CONTRAST TECHNIQUE: Multidetector CT imaging of the head and cervical spine was performed following the standard protocol without intravenous contrast. Multiplanar CT image  reconstructions of the cervical spine were also generated. COMPARISON:  01/02/2020, 07/09/2020 FINDINGS: CT HEAD FINDINGS Brain: No evidence of acute infarction, hemorrhage, hydrocephalus, extra-axial collection or mass lesion/mass effect. Moderate low-density changes within the periventricular and subcortical white matter compatible with chronic microvascular ischemic change. Mild diffuse cerebral volume  loss. Vascular: Atherosclerotic calcifications involving the large vessels of the skull base. No unexpected hyperdense vessel. Skull: Normal. Negative for fracture or focal lesion. Sinuses/Orbits: Mucosal thickening within the visualized right maxillary sinus and right sphenoid sinus. Scattered partial opacification within several mastoid air cells bilaterally. Orbital structures within normal limits. Other: Negative for scalp hematoma. CT CERVICAL SPINE FINDINGS Alignment: Unchanged grade 1 anterolisthesis C4 on C5, C5 on C6, and C7 on T1. No facet joint dislocation. Dens and lateral masses remain aligned. Skull base and vertebrae: No acute fracture. No primary bone lesion or focal pathologic process. Soft tissues and spinal canal: No prevertebral fluid or swelling. No visible canal hematoma. Disc levels: Advanced multilevel degenerative changes within the cervical spine and visualized upper thoracic spine, not appreciably changed compared to the prior study. Upper chest: Centrilobular emphysema within the visualized lung apices. Other: Enlarged multinodular thyroid gland. Aortic and bilateral carotid atherosclerosis. IMPRESSION: 1. No acute intracranial findings. 2. Chronic microvascular ischemic changes and cerebral volume loss. 3. No evidence of acute fracture or traumatic subluxation of the cervical spine. 4. Advanced multilevel degenerative changes of the cervical spine, not appreciably changed compared to the prior study. 5. Right maxillary and sphenoid sinus disease. 6. Partial opacification within several  mastoid air cells bilaterally. Correlate for signs and symptoms of mastoiditis. 7. Enlarged multinodular thyroid gland. Recommend thyroid ultrasound on a nonemergent basis (ref: J Am Coll Radiol. 2015 Feb;12(2): 143-50). Electronically Signed   By: Davina Poke D.O.   On: 09/29/2020 15:31   US Renal  Result Date: 09/29/2020 CLINICAL DATA:  79 year old female with acute renal insufficiency. EXAM: RENAL / URINARY TRACT ULTRASOUND COMPLETE COMPARISON:  Renal ultrasound dated 09/17/2019. FINDINGS: Right Kidney: Renal measurements: 7.1 x 3.3 x 3.2 cm = volume: 39 mL. The right kidney is atrophic and echogenic in keeping with chronic kidney disease. No hydronephrosis or shadowing stone. There is a subcentimeter upper pole cyst. Left Kidney: Renal measurements: 9.3 x 5.7 x 4.3 cm = volume: 120 mL. The left kidney is echogenic. No hydronephrosis or shadowing stone. There is a 1.4 cm interpolar cyst. Bladder: Appears normal for degree of bladder distention. Other: None. IMPRESSION: 1. Echogenic kidneys in keeping with chronic kidney disease. No hydronephrosis or shadowing stone. 2. Stable right renal atrophy. 3. Bilateral renal cysts. Electronically Signed   By: Anner Crete M.D.   On: 09/29/2020 19:07    EKG: Independently reviewed.  Sinus rhythm, T wave inversion in lateral leads but they were present on prior EKGs.  No peak T waves.  Assessment/Plan Active Problems:   Syncope   Syncope.  Most likely vasovagal as patient did feel some lightheadedness and call her husband stating that she is about to pass out.  No falls.  Husband did noticed some jerky movements momentarily as he told EMS, unable to verify, no prior history of seizures, most likely secondary to transient cerebral ischemia. Patient appears dry, increase in BUN and creatinine, might be poor p.o. intake. She received 1 L of bolus in ED followed by bicarb infusion by nephrology at 75 mL/h. EKG without any acute changes.  Echocardiogram  done in August 2021 was mostly within normal limit.  Patient also has hyperkalemia, no EKG changes but arrhythmia can be another possibility.  CT head without any acute changes. No prior history of seizure-like activity although Depakote was listed on her meds. Do have some urinary symptoms-UTI can also present with lightheadedness and syncopal episode in elderly. -Admit on telemetry to rule out any  cardiac arrhythmia -Check orthostatic vitals. -Check UA and urine culture.  Hyperkalemia.  Potassium at 6.  No EKG changes. -Lokelma. -Continue to monitor.  AKI with CKD stage IV.  Nephrology was also consulted by ED provider and they will see the patient tomorrow morning.  Patient appears dry. -Renal ultrasound -Continue bicarb infusion at 75 mL/h. -Encourage p.o. hydration.  Nonanion gap metabolic acidosis.  Most likely secondary to CKD.  Patient was on p.o. bicarb at home, not sure whether she was taking it or not. She was started on bicarb infusion by nephrology. -Continue to monitor  History of dementia. -Continue home dose of Aricept.  Hypertension.  Blood pressure elevated. -Continue home dose of amlodipine and clonidine. -She needs to be weaned off from clonidine due to increased risk of reflex hypertension if noncompliant, can be done as an outpatient.  COPD.  No acute concern -Continue home meds.  CAD.  No chest pain. -Continue home dose of aspirin and statin.  Anemia of chronic disease.  Hemoglobin at 9.7 -Continue to monitor   DVT prophylaxis: Heparin Code Status: Full code Family Communication: Unable to reach husband. Disposition Plan: Most likely back home Consults called: Nephrology Admission status: Observation   Lorella Nimrod MD [facility]  If 7PM-7AM, please contact night-coverage [email or web address]  09/29/2020, 7:47 PM   This record has been created using Systems analyst. Errors have been sought and corrected,but may not always  be located. Such creation errors do not reflect on the standard of care.

## 2020-09-29 NOTE — ED Notes (Signed)
Date and time results received: 09/29/20 2110  Test: Potassium Critical Value: 2.7  Name of Provider Notified: provider already aware from [facility] results

## 2020-09-29 NOTE — ED Notes (Signed)
Patient requesting something to help her sleep, Dr. Reesa Chew messaged regarding same.

## 2020-09-30 ENCOUNTER — Encounter (HOSPITAL_COMMUNITY): Payer: Self-pay | Admitting: Internal Medicine

## 2020-09-30 DIAGNOSIS — R55 Syncope and collapse: Secondary | ICD-10-CM | POA: Diagnosis not present

## 2020-09-30 LAB — URINALYSIS, ROUTINE W REFLEX MICROSCOPIC
Bacteria, UA: NONE SEEN
Bilirubin Urine: NEGATIVE
Glucose, UA: NEGATIVE mg/dL
Hgb urine dipstick: NEGATIVE
Ketones, ur: NEGATIVE mg/dL
Leukocytes,Ua: NEGATIVE
Nitrite: NEGATIVE
Protein, ur: 100 mg/dL — AB
Specific Gravity, Urine: 1.01 (ref 1.005–1.030)
pH: 7 (ref 5.0–8.0)

## 2020-09-30 LAB — COMPREHENSIVE METABOLIC PANEL
ALT: 11 U/L (ref 0–44)
AST: 15 U/L (ref 15–41)
Albumin: 3.1 g/dL — ABNORMAL LOW (ref 3.5–5.0)
Alkaline Phosphatase: 79 U/L (ref 38–126)
Anion gap: 8 (ref 5–15)
BUN: 41 mg/dL — ABNORMAL HIGH (ref 8–23)
CO2: 17 mmol/L — ABNORMAL LOW (ref 22–32)
Calcium: 9 mg/dL (ref 8.9–10.3)
Chloride: 108 mmol/L (ref 98–111)
Creatinine, Ser: 2.3 mg/dL — ABNORMAL HIGH (ref 0.44–1.00)
GFR, Estimated: 21 mL/min — ABNORMAL LOW (ref >60)
Glucose, Bld: 100 mg/dL — ABNORMAL HIGH (ref 70–99)
Potassium: 5.4 mmol/L — ABNORMAL HIGH (ref 3.5–5.1)
Sodium: 133 mmol/L — ABNORMAL LOW (ref 135–145)
Total Bilirubin: 0.6 mg/dL (ref 0.3–1.2)
Total Protein: 6.2 g/dL — ABNORMAL LOW (ref 6.5–8.1)

## 2020-09-30 LAB — CBC
HCT: 28.3 % — ABNORMAL LOW (ref 36.0–46.0)
Hemoglobin: 8.9 g/dL — ABNORMAL LOW (ref 12.0–15.0)
MCH: 27 pg (ref 26.0–34.0)
MCHC: 31.4 g/dL (ref 30.0–36.0)
MCV: 85.8 fL (ref 80.0–100.0)
Platelets: 368 10*3/uL (ref 150–400)
RBC: 3.3 MIL/uL — ABNORMAL LOW (ref 3.87–5.11)
RDW: 18.1 % — ABNORMAL HIGH (ref 11.5–15.5)
WBC: 7.9 10*3/uL (ref 4.0–10.5)
nRBC: 0 % (ref 0.0–0.2)

## 2020-09-30 LAB — RESP PANEL BY RT-PCR (FLU A&B, COVID) ARPGX2
Influenza A by PCR: NEGATIVE
Influenza B by PCR: NEGATIVE
SARS Coronavirus 2 by RT PCR: NEGATIVE

## 2020-09-30 LAB — OSMOLALITY, URINE: Osmolality, Ur: 394 mOsm/kg (ref 300–900)

## 2020-09-30 MED ORDER — CLOPIDOGREL BISULFATE 75 MG PO TABS
75.0000 mg | ORAL_TABLET | Freq: Every day | ORAL | Status: DC
  Administered 2020-09-30 – 2020-10-03 (×4): 75 mg via ORAL
  Filled 2020-09-30 (×4): qty 1

## 2020-09-30 MED ORDER — VALPROIC ACID 250 MG PO CAPS
250.0000 mg | ORAL_CAPSULE | Freq: Every day | ORAL | Status: DC
  Administered 2020-09-30 – 2020-10-02 (×3): 250 mg via ORAL
  Filled 2020-09-30 (×5): qty 1

## 2020-09-30 MED ORDER — MELATONIN 5 MG PO TABS
5.0000 mg | ORAL_TABLET | Freq: Every evening | ORAL | Status: DC | PRN
  Administered 2020-09-30 – 2020-10-02 (×3): 5 mg via ORAL
  Filled 2020-09-30 (×3): qty 1

## 2020-09-30 MED ORDER — AMLODIPINE BESYLATE 5 MG PO TABS
5.0000 mg | ORAL_TABLET | Freq: Every day | ORAL | Status: DC
  Administered 2020-10-01 – 2020-10-02 (×2): 5 mg via ORAL
  Filled 2020-09-30 (×2): qty 1

## 2020-09-30 MED ORDER — FERROUS SULFATE 325 (65 FE) MG PO TABS
325.0000 mg | ORAL_TABLET | Freq: Every day | ORAL | Status: DC
  Administered 2020-10-01 – 2020-10-03 (×3): 325 mg via ORAL
  Filled 2020-09-30 (×3): qty 1

## 2020-09-30 MED ORDER — AMLODIPINE BESYLATE 5 MG PO TABS
2.5000 mg | ORAL_TABLET | Freq: Once | ORAL | Status: AC
  Administered 2020-09-30: 2.5 mg via ORAL
  Filled 2020-09-30: qty 1

## 2020-09-30 NOTE — Progress Notes (Signed)
Alyssa [name] Progress Note   This is a 79 year old female with a history of HTN, HLD, COPD, CKD stage IV, CAD, and dementia who presented after a syncopal event. Follows with Dr. Joelyn Oms and last Cr was 2.2. On admission Cr was noted to be 2.81, bicarb 13 and potassium of 6.   Assessment/ Plan:   1. Acute on chronic kidney disease: BL Cr around 1.8-2.2. On admission Cr was 2.8. Patient was given IV fluids and bicarb yesterday. Today Cr is pending. Renal ultrasound showed no evidence of obstruction.  1. Urinalysis showed 100 protein, otherwise unremarkable. 2. Daily BMP 3. Awaiting labs from today.  4. Continue to avoid nephrotoxins. No NSAIDs, no ACE inhibitor's no ARB's no vancomycin/Zosyn combination. She may be sensitive to narcotics so avoid long-acting narcotics with active metabolite such as morphine 2. Hyperkalemia: Received lokelma 10 g only once yesterday. Last K around 2040 last night was 6.6. Today's labs pending.  1. Low potassium diet 2. Consider repeating lokelma if K remains elevated 3. HTN: BP remains elevated around 093-267T systolic. Currently on amlodipine 2.5, clonidine 0.2 mg daily. Increase amlodipine to 5 mg daily, will give dose of amlodipine 2.5 today.   4. Metabolic acidosis: Currently getting Na Bicarb IV drip, labs pending from today. Would continue Bicarb for now pending labs.  5. Anemia: Likely 2/2 renal function. Hgb 8.9 today. Down from 10.2 yesterday which may be dilutional. On iron sulfate at home and resumed here.    Subjective:   Patient reports feeling well today, denies any new complaints. Denies CP, SOB, fevers, chills, nausea, or vomiting.    Objective:   BP (!) 174/95   Pulse 62   Temp 97.7 F (36.5 C) (Oral)   Resp 16   SpO2 100%  No intake or output data in the 24 hours ending 09/30/20 1123 Weight change:   Physical Exam: General: Elderly female, NAD, laying in bed Cardiac: RRR, no m/r/g Pulmonary: CTABL, no wheezing,  rhonchi, rales Extremity: No LE swelling  Imaging: CT Head Wo Contrast  Result Date: 09/29/2020 CLINICAL DATA:  Syncope, headache, head and neck trauma EXAM: CT HEAD WITHOUT CONTRAST CT CERVICAL SPINE WITHOUT CONTRAST TECHNIQUE: Multidetector CT imaging of the head and cervical spine was performed following the standard protocol without intravenous contrast. Multiplanar CT image reconstructions of the cervical spine were also generated. COMPARISON:  01/02/2020, 07/09/2020 FINDINGS: CT HEAD FINDINGS Brain: No evidence of acute infarction, hemorrhage, hydrocephalus, extra-axial collection or mass lesion/mass effect. Moderate low-density changes within the periventricular and subcortical white matter compatible with chronic microvascular ischemic change. Mild diffuse cerebral volume loss. Vascular: Atherosclerotic calcifications involving the large vessels of the skull base. No unexpected hyperdense vessel. Skull: Normal. Negative for fracture or focal lesion. Sinuses/Orbits: Mucosal thickening within the visualized right maxillary sinus and right sphenoid sinus. Scattered partial opacification within several mastoid air cells bilaterally. Orbital structures within normal limits. Other: Negative for scalp hematoma. CT CERVICAL SPINE FINDINGS Alignment: Unchanged grade 1 anterolisthesis C4 on C5, C5 on C6, and C7 on T1. No facet joint dislocation. Dens and lateral masses remain aligned. Skull base and vertebrae: No acute fracture. No primary bone lesion or focal pathologic process. Soft tissues and spinal canal: No prevertebral fluid or swelling. No visible canal hematoma. Disc levels: Advanced multilevel degenerative changes within the cervical spine and visualized upper thoracic spine, not appreciably changed compared to the prior study. Upper chest: Centrilobular emphysema within the visualized lung apices. Other: Enlarged multinodular thyroid gland. Aortic  and bilateral carotid atherosclerosis. IMPRESSION: 1.  No acute intracranial findings. 2. Chronic microvascular ischemic changes and cerebral volume loss. 3. No evidence of acute fracture or traumatic subluxation of the cervical spine. 4. Advanced multilevel degenerative changes of the cervical spine, not appreciably changed compared to the prior study. 5. Right maxillary and sphenoid sinus disease. 6. Partial opacification within several mastoid air cells bilaterally. Correlate for signs and symptoms of mastoiditis. 7. Enlarged multinodular thyroid gland. Recommend thyroid ultrasound on a nonemergent basis (ref: J Am Coll Radiol. 2015 Feb;12(2): 143-50). Electronically Signed   By: Davina Poke D.O.   On: 09/29/2020 15:31   CT Cervical Spine Wo Contrast  Result Date: 09/29/2020 CLINICAL DATA:  Syncope, headache, head and neck trauma EXAM: CT HEAD WITHOUT CONTRAST CT CERVICAL SPINE WITHOUT CONTRAST TECHNIQUE: Multidetector CT imaging of the head and cervical spine was performed following the standard protocol without intravenous contrast. Multiplanar CT image reconstructions of the cervical spine were also generated. COMPARISON:  01/02/2020, 07/09/2020 FINDINGS: CT HEAD FINDINGS Brain: No evidence of acute infarction, hemorrhage, hydrocephalus, extra-axial collection or mass lesion/mass effect. Moderate low-density changes within the periventricular and subcortical white matter compatible with chronic microvascular ischemic change. Mild diffuse cerebral volume loss. Vascular: Atherosclerotic calcifications involving the large vessels of the skull base. No unexpected hyperdense vessel. Skull: Normal. Negative for fracture or focal lesion. Sinuses/Orbits: Mucosal thickening within the visualized right maxillary sinus and right sphenoid sinus. Scattered partial opacification within several mastoid air cells bilaterally. Orbital structures within normal limits. Other: Negative for scalp hematoma. CT CERVICAL SPINE FINDINGS Alignment: Unchanged grade 1  anterolisthesis C4 on C5, C5 on C6, and C7 on T1. No facet joint dislocation. Dens and lateral masses remain aligned. Skull base and vertebrae: No acute fracture. No primary bone lesion or focal pathologic process. Soft tissues and spinal canal: No prevertebral fluid or swelling. No visible canal hematoma. Disc levels: Advanced multilevel degenerative changes within the cervical spine and visualized upper thoracic spine, not appreciably changed compared to the prior study. Upper chest: Centrilobular emphysema within the visualized lung apices. Other: Enlarged multinodular thyroid gland. Aortic and bilateral carotid atherosclerosis. IMPRESSION: 1. No acute intracranial findings. 2. Chronic microvascular ischemic changes and cerebral volume loss. 3. No evidence of acute fracture or traumatic subluxation of the cervical spine. 4. Advanced multilevel degenerative changes of the cervical spine, not appreciably changed compared to the prior study. 5. Right maxillary and sphenoid sinus disease. 6. Partial opacification within several mastoid air cells bilaterally. Correlate for signs and symptoms of mastoiditis. 7. Enlarged multinodular thyroid gland. Recommend thyroid ultrasound on a nonemergent basis (ref: J Am Coll Radiol. 2015 Feb;12(2): 143-50). Electronically Signed   By: Davina Poke D.O.   On: 09/29/2020 15:31   US Renal  Result Date: 09/29/2020 CLINICAL DATA:  79 year old female with acute renal insufficiency. EXAM: RENAL / URINARY TRACT ULTRASOUND COMPLETE COMPARISON:  Renal ultrasound dated 09/17/2019. FINDINGS: Right Kidney: Renal measurements: 7.1 x 3.3 x 3.2 cm = volume: 39 mL. The right kidney is atrophic and echogenic in keeping with chronic kidney disease. No hydronephrosis or shadowing stone. There is a subcentimeter upper pole cyst. Left Kidney: Renal measurements: 9.3 x 5.7 x 4.3 cm = volume: 120 mL. The left kidney is echogenic. No hydronephrosis or shadowing stone. There is a 1.4 cm  interpolar cyst. Bladder: Appears normal for degree of bladder distention. Other: None. IMPRESSION: 1. Echogenic kidneys in keeping with chronic kidney disease. No hydronephrosis or shadowing stone. 2. Stable right renal atrophy.  3. Bilateral renal cysts. Electronically Signed   By: Anner Crete M.D.   On: 09/29/2020 19:07    Labs: BMET Recent Labs  Lab 09/29/20 1406 09/29/20 2005 09/29/20 2040  NA 136 135 137  K 6.0* 6.7* 6.6*  CL 110 111  --   CO2 13* 14*  --   GLUCOSE 128* 92  --   BUN 53* 51*  --   CREATININE 2.81* 2.55*  --   CALCIUM 9.1 9.3  --   PHOS  --  4.5  --    CBC Recent Labs  Lab 09/29/20 1406 09/29/20 2005 09/29/20 2040 09/30/20 0247  WBC 8.8 10.0  --  7.9  NEUTROABS 5.7  --   --   --   HGB 9.7* 9.9* 10.2* 8.9*  HCT 32.6* 31.7* 30.0* 28.3*  MCV 89.6 85.9  --  85.8  PLT 438* 450*  --  368    Medications:    . amLODipine  2.5 mg Oral Daily  . aspirin EC  81 mg Oral Daily  . cloNIDine  0.2 mg Oral QHS  . clopidogrel  75 mg Oral Daily  . donepezil  5 mg Oral Daily  . DULoxetine  30 mg Oral Daily  . heparin  5,000 Units Subcutaneous Q8H  . [START ON 10/01/2020] Iron High-Potency  325 mg Oral Q breakfast  . valproic acid  250 mg Oral QHS     Asencion Noble, M.D. [facility] 09/30/2020 11:23 AM

## 2020-09-30 NOTE — Plan of Care (Signed)
  Problem: Education: Goal: Knowledge of General Education information will improve Description Including pain rating scale, medication(s)/side effects and non-pharmacologic comfort measures Outcome: Progressing   

## 2020-09-30 NOTE — ED Notes (Signed)
Lunch Tray Ordered @ 1048. °

## 2020-09-30 NOTE — ED Notes (Signed)
C-collar order discontinued. Per ED provider note on 12-13 C-collar was removed at that time r/t negative imaging

## 2020-09-30 NOTE — Progress Notes (Addendum)
PROGRESS NOTE   CLOTILDE LOTH  QMV:[id number] DOB: 08-28-1941 DOA: 09/29/2020 PCP: [name], DO  Brief Narrative:  79 year old black female known CKD4, HTN, PAD, HTN, anemia of chronic disease--supposed to be on chronic bicarb, dementia, peripheral vascular disease with femorofemoral bypass 2013, chronic rib fracture Prior Covid infection admitted 8/16 through 8/21 for treatment Prior admission for syncope secondary to dehydration anorexia  Presented from home 12/29 feeling lightheaded and passed out-significant other caught her before falling-no seizures-some reported increased frequency no tongue biting confusion-independent for personal grooming but is taking care of by significant other  On arrival BP 150/80 bicarb 13 potassium 6 creatinine 2.8-nephrology consulted-given Lokelma 10 3 times daily started on bicarb 75 cc an hour  Assessment & Plan:   Active Problems:   Syncope   1. Syncope a. Likely 2/2 vol depletion b. c spine cleared by ED so can d./c c -collar 2. AKI with hyperkalemia a. No labs this morning so repeating the same--- unclear if was taking bicarb 2 tablets twice daily at home although she states she might have been b. Poor dialysis candidate per nephrology c. Supportive measures including Lokelma 10 3 times daily, sodium bicarb at 75 cc an hour d. Have discontinued various meds that can cause encephalopathy including gabapentin 100 Megace 40 Atarax 25  Robaxin 500 Bentyl 20 mg 3. HTN a. Continue amlodipine 2.5 clonidine 0.2--- holding at this time as a data 10 daily, Coreg 12.5 twice daily 4. Prior femorofemoral bypass 2013 a. Continue Plavix 75 daily, aspirin 81 daily 5. Anemia chronic disease a. Takes iron sulfate 325 with breakfast will resume b. Check not emergently iron stores 6. Prior Covid infection 05/2020 a. Stable at this time without oxygen requirement 7. Chronic rib fractures 8. Dementia a. Continue Depakote 250 at bedtime, Aricept 5  daily, Cymbalta 30 daily  DVT prophylaxis: Lovenox Code Status: Full Family Communication: None Disposition:  Status is: Observation  The patient remains OBS appropriate and will d/c before 2 midnights.  Dispo: The patient is from: Home              Anticipated d/c is to: SNF              Anticipated d/c date is: 2 days              Patient currently is not medically stable to d/c.       Consultants:   Nephrology  Procedures: None  Antimicrobials: No   Subjective: Awake alert coherent sitting up eating some breakfast Tells me just passed out-does not recall this having happened before however chart history denotes this has occurred multiple times Tells me lives with husband No chest pain no fever no chills no nausea no vomiting   Objective: Vitals:   09/30/20 0145 09/30/20 0258 09/30/20 0715 09/30/20 0830  BP: (!) 156/100 (!) 157/104 (!) 170/105 (!) 186/103  Pulse: 60 61 61 63  Resp: 15 15 14  (!) 21  Temp:      TempSrc:      SpO2: 99% 100% 99% 100%   No intake or output data in the 24 hours ending 09/30/20 0841 There were no vitals filed for this visit.  Examination:  Frail alopecia Moderate dentition Neck soft supple Chest clear no added sound S1-S2 no murmur no rub no gallop Abdomen soft no rebound no guarding Neurologically intact moving all 4 limbs equally No lower extremity edema   Data Reviewed: I have personally reviewed following labs and imaging studies \\Potassium   6.6 calcium 1.22 Hemoglobin down from 9.9.  49 White count 7.9  Radiology Studies: CT Head Wo Contrast  Result Date: 09/29/2020 CLINICAL DATA:  Syncope, headache, head and neck trauma EXAM: CT HEAD WITHOUT CONTRAST CT CERVICAL SPINE WITHOUT CONTRAST TECHNIQUE: Multidetector CT imaging of the head and cervical spine was performed following the standard protocol without intravenous contrast. Multiplanar CT image reconstructions of the cervical spine were also generated. COMPARISON:   01/02/2020, 07/09/2020 FINDINGS: CT HEAD FINDINGS Brain: No evidence of acute infarction, hemorrhage, hydrocephalus, extra-axial collection or mass lesion/mass effect. Moderate low-density changes within the periventricular and subcortical white matter compatible with chronic microvascular ischemic change. Mild diffuse cerebral volume loss. Vascular: Atherosclerotic calcifications involving the large vessels of the skull base. No unexpected hyperdense vessel. Skull: Normal. Negative for fracture or focal lesion. Sinuses/Orbits: Mucosal thickening within the visualized right maxillary sinus and right sphenoid sinus. Scattered partial opacification within several mastoid air cells bilaterally. Orbital structures within normal limits. Other: Negative for scalp hematoma. CT CERVICAL SPINE FINDINGS Alignment: Unchanged grade 1 anterolisthesis C4 on C5, C5 on C6, and C7 on T1. No facet joint dislocation. Dens and lateral masses remain aligned. Skull base and vertebrae: No acute fracture. No primary bone lesion or focal pathologic process. Soft tissues and spinal canal: No prevertebral fluid or swelling. No visible canal hematoma. Disc levels: Advanced multilevel degenerative changes within the cervical spine and visualized upper thoracic spine, not appreciably changed compared to the prior study. Upper chest: Centrilobular emphysema within the visualized lung apices. Other: Enlarged multinodular thyroid gland. Aortic and bilateral carotid atherosclerosis. IMPRESSION: 1. No acute intracranial findings. 2. Chronic microvascular ischemic changes and cerebral volume loss. 3. No evidence of acute fracture or traumatic subluxation of the cervical spine. 4. Advanced multilevel degenerative changes of the cervical spine, not appreciably changed compared to the prior study. 5. Right maxillary and sphenoid sinus disease. 6. Partial opacification within several mastoid air cells bilaterally. Correlate for signs and symptoms of  mastoiditis. 7. Enlarged multinodular thyroid gland. Recommend thyroid ultrasound on a nonemergent basis (ref: J Am Coll Radiol. 2015 Feb;12(2): 143-50). Electronically Signed   By: Davina Poke D.O.   On: 09/29/2020 15:31   CT Cervical Spine Wo Contrast  Result Date: 09/29/2020 CLINICAL DATA:  Syncope, headache, head and neck trauma EXAM: CT HEAD WITHOUT CONTRAST CT CERVICAL SPINE WITHOUT CONTRAST TECHNIQUE: Multidetector CT imaging of the head and cervical spine was performed following the standard protocol without intravenous contrast. Multiplanar CT image reconstructions of the cervical spine were also generated. COMPARISON:  01/02/2020, 07/09/2020 FINDINGS: CT HEAD FINDINGS Brain: No evidence of acute infarction, hemorrhage, hydrocephalus, extra-axial collection or mass lesion/mass effect. Moderate low-density changes within the periventricular and subcortical white matter compatible with chronic microvascular ischemic change. Mild diffuse cerebral volume loss. Vascular: Atherosclerotic calcifications involving the large vessels of the skull base. No unexpected hyperdense vessel. Skull: Normal. Negative for fracture or focal lesion. Sinuses/Orbits: Mucosal thickening within the visualized right maxillary sinus and right sphenoid sinus. Scattered partial opacification within several mastoid air cells bilaterally. Orbital structures within normal limits. Other: Negative for scalp hematoma. CT CERVICAL SPINE FINDINGS Alignment: Unchanged grade 1 anterolisthesis C4 on C5, C5 on C6, and C7 on T1. No facet joint dislocation. Dens and lateral masses remain aligned. Skull base and vertebrae: No acute fracture. No primary bone lesion or focal pathologic process. Soft tissues and spinal canal: No prevertebral fluid or swelling. No visible canal hematoma. Disc levels: Advanced multilevel degenerative changes within the cervical spine  and visualized upper thoracic spine, not appreciably changed compared to the  prior study. Upper chest: Centrilobular emphysema within the visualized lung apices. Other: Enlarged multinodular thyroid gland. Aortic and bilateral carotid atherosclerosis. IMPRESSION: 1. No acute intracranial findings. 2. Chronic microvascular ischemic changes and cerebral volume loss. 3. No evidence of acute fracture or traumatic subluxation of the cervical spine. 4. Advanced multilevel degenerative changes of the cervical spine, not appreciably changed compared to the prior study. 5. Right maxillary and sphenoid sinus disease. 6. Partial opacification within several mastoid air cells bilaterally. Correlate for signs and symptoms of mastoiditis. 7. Enlarged multinodular thyroid gland. Recommend thyroid ultrasound on a nonemergent basis (ref: J Am Coll Radiol. 2015 Feb;12(2): 143-50). Electronically Signed   By: Davina Poke D.O.   On: 09/29/2020 15:31   US Renal  Result Date: 09/29/2020 CLINICAL DATA:  79 year old female with acute renal insufficiency. EXAM: RENAL / URINARY TRACT ULTRASOUND COMPLETE COMPARISON:  Renal ultrasound dated 09/17/2019. FINDINGS: Right Kidney: Renal measurements: 7.1 x 3.3 x 3.2 cm = volume: 39 mL. The right kidney is atrophic and echogenic in keeping with chronic kidney disease. No hydronephrosis or shadowing stone. There is a subcentimeter upper pole cyst. Left Kidney: Renal measurements: 9.3 x 5.7 x 4.3 cm = volume: 120 mL. The left kidney is echogenic. No hydronephrosis or shadowing stone. There is a 1.4 cm interpolar cyst. Bladder: Appears normal for degree of bladder distention. Other: None. IMPRESSION: 1. Echogenic kidneys in keeping with chronic kidney disease. No hydronephrosis or shadowing stone. 2. Stable right renal atrophy. 3. Bilateral renal cysts. Electronically Signed   By: Anner Crete M.D.   On: 09/29/2020 19:07     Scheduled Meds: . amLODipine  2.5 mg Oral Daily  . aspirin EC  81 mg Oral Daily  . cloNIDine  0.2 mg Oral QHS  . donepezil  5 mg Oral  Daily  . DULoxetine  30 mg Oral Daily  . heparin  5,000 Units Subcutaneous Q8H   Continuous Infusions: . sodium bicarbonate in D5W 1000 mL infusion 75 mL/hr at 09/29/20 2009     LOS: 0 days    Time spent: 18  Nita Sells, MD [facility] To contact the attending provider between 7A-7P or the covering provider during after hours 7P-7A, please log into the web site [email or web address] and access using universal [place] password for that web site. If you do not have the password, please call the hospital operator.  09/30/2020, 8:41 AM

## 2020-10-01 DIAGNOSIS — N179 Acute kidney failure, unspecified: Secondary | ICD-10-CM | POA: Diagnosis present

## 2020-10-01 DIAGNOSIS — Z20822 Contact with and (suspected) exposure to covid-19: Secondary | ICD-10-CM | POA: Diagnosis present

## 2020-10-01 DIAGNOSIS — D631 Anemia in chronic kidney disease: Secondary | ICD-10-CM | POA: Diagnosis present

## 2020-10-01 DIAGNOSIS — G894 Chronic pain syndrome: Secondary | ICD-10-CM | POA: Diagnosis present

## 2020-10-01 DIAGNOSIS — Z8616 Personal history of COVID-19: Secondary | ICD-10-CM | POA: Diagnosis not present

## 2020-10-01 DIAGNOSIS — I2510 Atherosclerotic heart disease of native coronary artery without angina pectoris: Secondary | ICD-10-CM | POA: Diagnosis present

## 2020-10-01 DIAGNOSIS — N184 Chronic kidney disease, stage 4 (severe): Secondary | ICD-10-CM | POA: Diagnosis present

## 2020-10-01 DIAGNOSIS — G629 Polyneuropathy, unspecified: Secondary | ICD-10-CM | POA: Diagnosis present

## 2020-10-01 DIAGNOSIS — E875 Hyperkalemia: Secondary | ICD-10-CM | POA: Diagnosis present

## 2020-10-01 DIAGNOSIS — M21372 Foot drop, left foot: Secondary | ICD-10-CM | POA: Diagnosis present

## 2020-10-01 DIAGNOSIS — F17210 Nicotine dependence, cigarettes, uncomplicated: Secondary | ICD-10-CM | POA: Diagnosis present

## 2020-10-01 DIAGNOSIS — E871 Hypo-osmolality and hyponatremia: Secondary | ICD-10-CM | POA: Diagnosis not present

## 2020-10-01 DIAGNOSIS — R55 Syncope and collapse: Secondary | ICD-10-CM | POA: Diagnosis present

## 2020-10-01 DIAGNOSIS — I129 Hypertensive chronic kidney disease with stage 1 through stage 4 chronic kidney disease, or unspecified chronic kidney disease: Secondary | ICD-10-CM | POA: Diagnosis present

## 2020-10-01 DIAGNOSIS — J449 Chronic obstructive pulmonary disease, unspecified: Secondary | ICD-10-CM | POA: Diagnosis present

## 2020-10-01 DIAGNOSIS — I701 Atherosclerosis of renal artery: Secondary | ICD-10-CM | POA: Diagnosis present

## 2020-10-01 DIAGNOSIS — Z96641 Presence of right artificial hip joint: Secondary | ICD-10-CM | POA: Diagnosis present

## 2020-10-01 DIAGNOSIS — M8448XD Pathological fracture, other site, subsequent encounter for fracture with routine healing: Secondary | ICD-10-CM | POA: Diagnosis present

## 2020-10-01 DIAGNOSIS — I739 Peripheral vascular disease, unspecified: Secondary | ICD-10-CM | POA: Diagnosis present

## 2020-10-01 DIAGNOSIS — E872 Acidosis: Secondary | ICD-10-CM | POA: Diagnosis present

## 2020-10-01 DIAGNOSIS — M1990 Unspecified osteoarthritis, unspecified site: Secondary | ICD-10-CM | POA: Diagnosis present

## 2020-10-01 DIAGNOSIS — E869 Volume depletion, unspecified: Secondary | ICD-10-CM | POA: Diagnosis present

## 2020-10-01 DIAGNOSIS — Z7902 Long term (current) use of antithrombotics/antiplatelets: Secondary | ICD-10-CM | POA: Diagnosis not present

## 2020-10-01 DIAGNOSIS — F0390 Unspecified dementia without behavioral disturbance: Secondary | ICD-10-CM | POA: Diagnosis present

## 2020-10-01 DIAGNOSIS — E785 Hyperlipidemia, unspecified: Secondary | ICD-10-CM | POA: Diagnosis present

## 2020-10-01 LAB — BASIC METABOLIC PANEL
Anion gap: 10 (ref 5–15)
BUN: 40 mg/dL — ABNORMAL HIGH (ref 8–23)
CO2: 17 mmol/L — ABNORMAL LOW (ref 22–32)
Calcium: 8.8 mg/dL — ABNORMAL LOW (ref 8.9–10.3)
Chloride: 104 mmol/L (ref 98–111)
Creatinine, Ser: 2.16 mg/dL — ABNORMAL HIGH (ref 0.44–1.00)
GFR, Estimated: 23 mL/min — ABNORMAL LOW (ref >60)
Glucose, Bld: 96 mg/dL (ref 70–99)
Potassium: 5.1 mmol/L (ref 3.5–5.1)
Sodium: 131 mmol/L — ABNORMAL LOW (ref 135–145)

## 2020-10-01 LAB — URINE CULTURE: Culture: 20000 — AB

## 2020-10-01 MED ORDER — CARVEDILOL 12.5 MG PO TABS: 12.5000 mg | ORAL_TABLET | Freq: Two times a day (BID) | ORAL | Status: DC

## 2020-10-01 MED ORDER — CARVEDILOL 12.5 MG PO TABS
12.5000 mg | ORAL_TABLET | Freq: Two times a day (BID) | ORAL | Status: DC
  Administered 2020-10-01 – 2020-10-03 (×5): 12.5 mg via ORAL
  Filled 2020-10-01 (×5): qty 1

## 2020-10-01 NOTE — Progress Notes (Addendum)
Progress Note    Alyssa [name]  JJO:[id number] DOB: [birth year]  DOA: 09/29/2020 PCP: [name], DO      Brief Narrative:    Medical records reviewed and are as summarized below:  Alyssa [name] is a 79 y.o. female known CKD4, HTN, PAD, HTN, anemia of chronic disease--supposed to be on chronic bicarb, dementia, peripheral vascular disease with femorofemoral bypass 2013, chronic rib fracture ,prior Covid infection admitted 8/16 through 8/21 for treatment, prior admission for syncope secondary to dehydration and anorexia.  She was brought to the hospital because of lightheadedness and syncope.  Reportedly, she was going to the bathroom when she felt lightheaded and passed out.  Fortunately, her husband was able to catch her so she did not fall.  No history of seizures.     Assessment/Plan:   Principal Problem:   AKI (acute kidney injury) ([place]) Active Problems:   PVD (peripheral vascular disease) ([place])   Syncope   CKD (chronic kidney disease) stage 4, GFR 15-29 ml/min ([facility])   Hyperkalemia   Body mass index is 19.59 kg/m.    S/p syncope: Likely due to volume depletion.  Consult PT.  AKI on CKD stage IV complicated hyperkalemia and non-anion, metabolic acidosis: Continue IV sodium bicarbonate infusion.  Monitor BMP.  Follow-up with nephrologist.  Hypertension: Continue antihypertensives  PVD with prior femorofemoral bypass: Continue Plavix and aspirin  Dementia: Continue psychotropics and supportive care.  Other comorbidities include chronic rib fractures, previous COVID-19 infection in August 2021   Diet Order            Diet Heart Room service appropriate? Yes; Fluid consistency: Thin  Diet effective now                    Consultants:  Nephrologist  Procedures:  None    Medications:   . amLODipine  5 mg Oral Daily  . aspirin EC  81 mg Oral Daily  . carvedilol  12.5 mg Oral BID WC  . cloNIDine  0.2 mg Oral QHS  .  clopidogrel  75 mg Oral Daily  . donepezil  5 mg Oral Daily  . DULoxetine  30 mg Oral Daily  . ferrous sulfate  325 mg Oral Q breakfast  . heparin  5,000 Units Subcutaneous Q8H  . valproic acid  250 mg Oral QHS   Continuous Infusions: . sodium bicarbonate in D5W 1000 mL infusion 75 mL/hr at 10/01/20 0143     Anti-infectives (From admission, onward)   None             Family Communication/Anticipated D/C date and plan/Code Status   DVT prophylaxis: heparin injection 5,000 Units Start: 09/29/20 2200     Code Status: Full Code  Family Communication: None Disposition Plan:    Status is: Observation  The patient will require care spanning > 2 midnights and should be moved to inpatient because: IV treatments appropriate due to intensity of illness or inability to take PO and Inpatient level of care appropriate due to severity of illness  Dispo: The patient is from: Home              Anticipated d/c is to: Home              Anticipated d/c date is: 1 day              Patient currently is not medically stable to d/c.           Subjective:  Interval events noted.  No shortness of breath, chest pain or dizziness.  She has adequate urine output.  Objective:    Vitals:   10/01/20 0022 10/01/20 0405 10/01/20 0728 10/01/20 1000  BP: (!) 153/91 (!) 161/92  (!) 175/103  Pulse: 63 69  69  Resp: 17 17  16   Temp: 98.3 F (36.8 C) 98.1 F (36.7 C)  99.5 F (37.5 C)  TempSrc:    Oral  SpO2: 96% 97%  99%  Weight:      Height:   5' (1.524 m)    No data found.   Intake/Output Summary (Last 24 hours) at 10/01/2020 1219 Last data filed at 10/01/2020 0600 Gross per 24 hour  Intake 2353.37 ml  Output --  Net 2353.37 ml   Filed Weights   09/30/20 2004  Weight: 45.5 kg    Exam:  GEN: NAD SKIN: No rash EYES: EOMI ENT: MMM CV: RRR PULM: CTA B ABD: soft, ND, NT, +BS CNS: AAO x person and place, non focal EXT: No edema or tenderness    Data  Reviewed:   I have personally reviewed following labs and imaging studies:  Labs: Labs show the following:   Basic Metabolic Panel: Recent Labs  Lab 09/29/20 1406 09/29/20 2005 09/29/20 2040 09/30/20 1256 10/01/20 1050  NA 136 135 137 133* 131*  K 6.0* 6.7* 6.6* 5.4* 5.1  CL 110 111  --  108 104  CO2 13* 14*  --  17* 17*  GLUCOSE 128* 92  --  100* 96  BUN 53* 51*  --  41* 40*  CREATININE 2.81* 2.55*  --  2.30* 2.16*  CALCIUM 9.1 9.3  --  9.0 8.8*  MG 2.5*  --   --   --   --   PHOS  --  4.5  --   --   --    GFR Estimated Creatinine Clearance: 15.7 mL/min (A) (by C-G formula based on SCr of 2.16 mg/dL (H)). Liver Function Tests: Recent Labs  Lab 09/29/20 1406 09/29/20 2005 09/30/20 1256  AST 17  --  15  ALT 14  --  11  ALKPHOS 77  --  79  BILITOT 0.4  --  0.6  PROT 6.7  --  6.2*  ALBUMIN 3.4* 3.4* 3.1*   No results for input(s): LIPASE, AMYLASE in the last 168 hours. No results for input(s): AMMONIA in the last 168 hours. Coagulation profile No results for input(s): INR, PROTIME in the last 168 hours.  CBC: Recent Labs  Lab 09/29/20 1406 09/29/20 2005 09/29/20 2040 09/30/20 0247  WBC 8.8 10.0  --  7.9  NEUTROABS 5.7  --   --   --   HGB 9.7* 9.9* 10.2* 8.9*  HCT 32.6* 31.7* 30.0* 28.3*  MCV 89.6 85.9  --  85.8  PLT 438* 450*  --  368   Cardiac Enzymes: No results for input(s): CKTOTAL, CKMB, CKMBINDEX, TROPONINI in the last 168 hours. BNP (last 3 results) No results for input(s): PROBNP in the last 8760 hours. CBG: No results for input(s): GLUCAP in the last 168 hours. D-Dimer: No results for input(s): DDIMER in the last 72 hours. Hgb A1c: No results for input(s): HGBA1C in the last 72 hours. Lipid Profile: No results for input(s): CHOL, HDL, LDLCALC, TRIG, CHOLHDL, LDLDIRECT in the last 72 hours. Thyroid function studies: No results for input(s): TSH, T4TOTAL, T3FREE, THYROIDAB in the last 72 hours.  Invalid input(s): FREET3 Anemia work  up: No results for  input(s): VITAMINB12, FOLATE, FERRITIN, TIBC, IRON, RETICCTPCT in the last 72 hours. Sepsis Labs: Recent Labs  Lab 09/29/20 1406 09/29/20 2005 09/30/20 0247  WBC 8.8 10.0 7.9    Microbiology Recent Results (from the past 240 hour(s))  Culture, Urine     Status: Abnormal   Collection Time: 09/29/20 11:53 PM   Specimen: Urine, Random  Result Value Ref Range Status   Specimen Description URINE, RANDOM  Final   Special Requests   Final    NONE Performed at [facility], [address] [place], [place] [address]    Culture 20,000 COLONIES/mL ENTEROBACTER AEROGENES (A)  Final   Report Status 10/01/2020 FINAL  Final   Organism ID, Bacteria ENTEROBACTER AEROGENES (A)  Final      Susceptibility   Enterobacter aerogenes - MIC*    CEFAZOLIN >=64 RESISTANT Resistant     CEFEPIME <=0.12 SENSITIVE Sensitive     CEFTRIAXONE <=0.25 SENSITIVE Sensitive     CIPROFLOXACIN <=0.25 SENSITIVE Sensitive     GENTAMICIN <=1 SENSITIVE Sensitive     IMIPENEM 1 SENSITIVE Sensitive     NITROFURANTOIN 128 RESISTANT Resistant     TRIMETH/SULFA <=20 SENSITIVE Sensitive     PIP/TAZO <=4 SENSITIVE Sensitive     * 20,000 COLONIES/mL ENTEROBACTER AEROGENES  Resp Panel by RT-PCR (Flu A&B, Covid) Nasopharyngeal Swab     Status: None   Collection Time: 09/30/20  2:00 PM   Specimen: Nasopharyngeal Swab; Nasopharyngeal(NP) swabs in vial transport medium  Result Value Ref Range Status   SARS Coronavirus 2 by RT PCR NEGATIVE NEGATIVE Final    Comment: (NOTE) SARS-CoV-2 target nucleic acids are NOT DETECTED.  The SARS-CoV-2 RNA is generally detectable in upper respiratory specimens during the acute phase of infection. The lowest concentration of SARS-CoV-2 viral copies this assay can detect is 138 copies/mL. A negative result does not preclude SARS-Cov-2 infection and should not be used as the sole basis for treatment or other patient management decisions. A negative result may occur  with  improper specimen collection/handling, submission of specimen other than nasopharyngeal swab, presence of viral mutation(s) within the areas targeted by this assay, and inadequate number of viral copies(<138 copies/mL). A negative result must be combined with clinical observations, patient history, and epidemiological information. The expected result is Negative.  Fact Sheet for Patients:  EntrepreneurPulse.com.au  Fact Sheet for Healthcare Providers:  IncredibleEmployment.be  This test is no t yet approved or cleared by the [place] FDA and  has been authorized for detection and/or diagnosis of SARS-CoV-2 by FDA under an Emergency Use Authorization (EUA). This EUA will remain  in effect (meaning this test can be used) for the duration of the COVID-19 declaration under Section 564(b)(1) of the Act, 21 U.S.C.section 360bbb-3(b)(1), unless the authorization is terminated  or revoked sooner.       Influenza A by PCR NEGATIVE NEGATIVE Final   Influenza B by PCR NEGATIVE NEGATIVE Final    Comment: (NOTE) The Xpert Xpress SARS-CoV-2/FLU/RSV plus assay is intended as an aid in the diagnosis of influenza from Nasopharyngeal swab specimens and should not be used as a sole basis for treatment. Nasal washings and aspirates are unacceptable for Xpert Xpress SARS-CoV-2/FLU/RSV testing.  Fact Sheet for Patients: EntrepreneurPulse.com.au  Fact Sheet for Healthcare Providers: IncredibleEmployment.be  This test is not yet approved or cleared by the [place] FDA and has been authorized for detection and/or diagnosis of SARS-CoV-2 by FDA under an Emergency Use Authorization (EUA). This EUA will remain in  effect (meaning this test can be used) for the duration of the COVID-19 declaration under Section 564(b)(1) of the Act, 21 U.S.C. section 360bbb-3(b)(1), unless the authorization is terminated  or revoked.  Performed at [facility], [place] [address] [place], [place] [address]     Procedures and diagnostic studies:  CT Head Wo Contrast  Result Date: 09/29/2020 CLINICAL DATA:  Syncope, headache, head and neck trauma EXAM: CT HEAD WITHOUT CONTRAST CT CERVICAL SPINE WITHOUT CONTRAST TECHNIQUE: Multidetector CT imaging of the head and cervical spine was performed following the standard protocol without intravenous contrast. Multiplanar CT image reconstructions of the cervical spine were also generated. COMPARISON:  01/02/2020, 07/09/2020 FINDINGS: CT HEAD FINDINGS Brain: No evidence of acute infarction, hemorrhage, hydrocephalus, extra-axial collection or mass lesion/mass effect. Moderate low-density changes within the periventricular and subcortical white matter compatible with chronic microvascular ischemic change. Mild diffuse cerebral volume loss. Vascular: Atherosclerotic calcifications involving the large vessels of the skull base. No unexpected hyperdense vessel. Skull: Normal. Negative for fracture or focal lesion. Sinuses/Orbits: Mucosal thickening within the visualized right maxillary sinus and right sphenoid sinus. Scattered partial opacification within several mastoid air cells bilaterally. Orbital structures within normal limits. Other: Negative for scalp hematoma. CT CERVICAL SPINE FINDINGS Alignment: Unchanged grade 1 anterolisthesis C4 on C5, C5 on C6, and C7 on T1. No facet joint dislocation. Dens and lateral masses remain aligned. Skull base and vertebrae: No acute fracture. No primary bone lesion or focal pathologic process. Soft tissues and spinal canal: No prevertebral fluid or swelling. No visible canal hematoma. Disc levels: Advanced multilevel degenerative changes within the cervical spine and visualized upper thoracic spine, not appreciably changed compared to the prior study. Upper chest: Centrilobular emphysema within the visualized lung apices. Other: Enlarged  multinodular thyroid gland. Aortic and bilateral carotid atherosclerosis. IMPRESSION: 1. No acute intracranial findings. 2. Chronic microvascular ischemic changes and cerebral volume loss. 3. No evidence of acute fracture or traumatic subluxation of the cervical spine. 4. Advanced multilevel degenerative changes of the cervical spine, not appreciably changed compared to the prior study. 5. Right maxillary and sphenoid sinus disease. 6. Partial opacification within several mastoid air cells bilaterally. Correlate for signs and symptoms of mastoiditis. 7. Enlarged multinodular thyroid gland. Recommend thyroid ultrasound on a nonemergent basis (ref: J Am Coll Radiol. 2015 Feb;12(2): 143-50). Electronically Signed   By: Davina Poke D.O.   On: 09/29/2020 15:31   CT Cervical Spine Wo Contrast  Result Date: 09/29/2020 CLINICAL DATA:  Syncope, headache, head and neck trauma EXAM: CT HEAD WITHOUT CONTRAST CT CERVICAL SPINE WITHOUT CONTRAST TECHNIQUE: Multidetector CT imaging of the head and cervical spine was performed following the standard protocol without intravenous contrast. Multiplanar CT image reconstructions of the cervical spine were also generated. COMPARISON:  01/02/2020, 07/09/2020 FINDINGS: CT HEAD FINDINGS Brain: No evidence of acute infarction, hemorrhage, hydrocephalus, extra-axial collection or mass lesion/mass effect. Moderate low-density changes within the periventricular and subcortical white matter compatible with chronic microvascular ischemic change. Mild diffuse cerebral volume loss. Vascular: Atherosclerotic calcifications involving the large vessels of the skull base. No unexpected hyperdense vessel. Skull: Normal. Negative for fracture or focal lesion. Sinuses/Orbits: Mucosal thickening within the visualized right maxillary sinus and right sphenoid sinus. Scattered partial opacification within several mastoid air cells bilaterally. Orbital structures within normal limits. Other: Negative  for scalp hematoma. CT CERVICAL SPINE FINDINGS Alignment: Unchanged grade 1 anterolisthesis C4 on C5, C5 on C6, and C7 on T1. No facet joint dislocation. Dens and lateral masses remain  aligned. Skull base and vertebrae: No acute fracture. No primary bone lesion or focal pathologic process. Soft tissues and spinal canal: No prevertebral fluid or swelling. No visible canal hematoma. Disc levels: Advanced multilevel degenerative changes within the cervical spine and visualized upper thoracic spine, not appreciably changed compared to the prior study. Upper chest: Centrilobular emphysema within the visualized lung apices. Other: Enlarged multinodular thyroid gland. Aortic and bilateral carotid atherosclerosis. IMPRESSION: 1. No acute intracranial findings. 2. Chronic microvascular ischemic changes and cerebral volume loss. 3. No evidence of acute fracture or traumatic subluxation of the cervical spine. 4. Advanced multilevel degenerative changes of the cervical spine, not appreciably changed compared to the prior study. 5. Right maxillary and sphenoid sinus disease. 6. Partial opacification within several mastoid air cells bilaterally. Correlate for signs and symptoms of mastoiditis. 7. Enlarged multinodular thyroid gland. Recommend thyroid ultrasound on a nonemergent basis (ref: J Am Coll Radiol. 2015 Feb;12(2): 143-50). Electronically Signed   By: Davina Poke D.O.   On: 09/29/2020 15:31   US Renal  Result Date: 09/29/2020 CLINICAL DATA:  80 year old female with acute renal insufficiency. EXAM: RENAL / URINARY TRACT ULTRASOUND COMPLETE COMPARISON:  Renal ultrasound dated 09/17/2019. FINDINGS: Right Kidney: Renal measurements: 7.1 x 3.3 x 3.2 cm = volume: 39 mL. The right kidney is atrophic and echogenic in keeping with chronic kidney disease. No hydronephrosis or shadowing stone. There is a subcentimeter upper pole cyst. Left Kidney: Renal measurements: 9.3 x 5.7 x 4.3 cm = volume: 120 mL. The left kidney is  echogenic. No hydronephrosis or shadowing stone. There is a 1.4 cm interpolar cyst. Bladder: Appears normal for degree of bladder distention. Other: None. IMPRESSION: 1. Echogenic kidneys in keeping with chronic kidney disease. No hydronephrosis or shadowing stone. 2. Stable right renal atrophy. 3. Bilateral renal cysts. Electronically Signed   By: Anner Crete M.D.   On: 09/29/2020 19:07               LOS: 0 days   Josmar Messimer  [facility]   Pager on [email or web address]. If 7PM-7AM, please contact night-coverage at [email or web address]     10/01/2020, 12:19 PM

## 2020-10-01 NOTE — Plan of Care (Signed)
  Problem: Education: Goal: Knowledge of General Education information will improve Description Including pain rating scale, medication(s)/side effects and non-pharmacologic comfort measures Outcome: Progressing   Problem: Health Behavior/Discharge Planning: Goal: Ability to manage health-related needs will improve Outcome: Progressing   

## 2020-10-01 NOTE — Progress Notes (Signed)
[facility] Progress Note   Assessment/ Plan:   1. Acute kidney Injury on chronic kidney disease stage IV (baseline creatinine suspected 1.8-2.2): Came in with what appears to be hemodynamically mediated acute kidney injury and multiple metabolic abnormalities that have improved with intravenous fluids and medical management.  Labs requested from this morning to direct further management. 2.  Hyperkalemia: Secondary to acute kidney injury in this patient with history of renal artery stenosis.  Status post medical management/intravenous fluids and awaiting labs from this morning. 3.  Anion gap metabolic acidosis: Secondary to acute kidney injury, status post isotonic sodium bicarbonate and awaiting labs from this morning to reassess management. 4.  Hypertension: Significantly elevated blood pressures noted, will restart carvedilol. 5.  Anemia: Without overt loss and downtrending hemoglobin/hematocrit likely from correction of initial hemoconcentration/falsely elevated H&H following intravenous fluids.  Will check iron saturation.  Subjective:   Reports to be feeling somewhat better, fatigued from poor sleep overnight.   Objective:   BP (!) 175/103 (BP Location: Right Arm)   Pulse 69   Temp 99.5 F (37.5 C) (Oral)   Resp 16   Ht 5' (1.524 m)   Wt 45.5 kg   SpO2 99%   BMI 19.59 kg/m   Intake/Output Summary (Last 24 hours) at 10/01/2020 1025 Last data filed at 10/01/2020 0600 Gross per 24 hour  Intake 2353.37 ml  Output --  Net 2353.37 ml   Weight change:   Physical Exam: Gen: Appears comfortable resting in bed, not in distress. CVS: Pulse regular rhythm, normal rate, S1 and S2 normal Resp: Anteriorly clear to auscultation, no distinct rales or rhonchi Abd: Soft, flat, nontender, bowel sounds normal Ext: No lower extremity edema on exam.  Imaging: CT Head Wo Contrast  Result Date: 09/29/2020 CLINICAL DATA:  Syncope, headache, head and neck trauma EXAM: CT  HEAD WITHOUT CONTRAST CT CERVICAL SPINE WITHOUT CONTRAST TECHNIQUE: Multidetector CT imaging of the head and cervical spine was performed following the standard protocol without intravenous contrast. Multiplanar CT image reconstructions of the cervical spine were also generated. COMPARISON:  01/02/2020, 07/09/2020 FINDINGS: CT HEAD FINDINGS Brain: No evidence of acute infarction, hemorrhage, hydrocephalus, extra-axial collection or mass lesion/mass effect. Moderate low-density changes within the periventricular and subcortical white matter compatible with chronic microvascular ischemic change. Mild diffuse cerebral volume loss. Vascular: Atherosclerotic calcifications involving the large vessels of the skull base. No unexpected hyperdense vessel. Skull: Normal. Negative for fracture or focal lesion. Sinuses/Orbits: Mucosal thickening within the visualized right maxillary sinus and right sphenoid sinus. Scattered partial opacification within several mastoid air cells bilaterally. Orbital structures within normal limits. Other: Negative for scalp hematoma. CT CERVICAL SPINE FINDINGS Alignment: Unchanged grade 1 anterolisthesis C4 on C5, C5 on C6, and C7 on T1. No facet joint dislocation. Dens and lateral masses remain aligned. Skull base and vertebrae: No acute fracture. No primary bone lesion or focal pathologic process. Soft tissues and spinal canal: No prevertebral fluid or swelling. No visible canal hematoma. Disc levels: Advanced multilevel degenerative changes within the cervical spine and visualized upper thoracic spine, not appreciably changed compared to the prior study. Upper chest: Centrilobular emphysema within the visualized lung apices. Other: Enlarged multinodular thyroid gland. Aortic and bilateral carotid atherosclerosis. IMPRESSION: 1. No acute intracranial findings. 2. Chronic microvascular ischemic changes and cerebral volume loss. 3. No evidence of acute fracture or traumatic subluxation of the  cervical spine. 4. Advanced multilevel degenerative changes of the cervical spine, not appreciably changed compared to the prior study. 5.  Right maxillary and sphenoid sinus disease. 6. Partial opacification within several mastoid air cells bilaterally. Correlate for signs and symptoms of mastoiditis. 7. Enlarged multinodular thyroid gland. Recommend thyroid ultrasound on a nonemergent basis (ref: J Am Coll Radiol. 2015 Feb;12(2): 143-50). Electronically Signed   By: Davina Poke D.O.   On: 09/29/2020 15:31   CT Cervical Spine Wo Contrast  Result Date: 09/29/2020 CLINICAL DATA:  Syncope, headache, head and neck trauma EXAM: CT HEAD WITHOUT CONTRAST CT CERVICAL SPINE WITHOUT CONTRAST TECHNIQUE: Multidetector CT imaging of the head and cervical spine was performed following the standard protocol without intravenous contrast. Multiplanar CT image reconstructions of the cervical spine were also generated. COMPARISON:  01/02/2020, 07/09/2020 FINDINGS: CT HEAD FINDINGS Brain: No evidence of acute infarction, hemorrhage, hydrocephalus, extra-axial collection or mass lesion/mass effect. Moderate low-density changes within the periventricular and subcortical white matter compatible with chronic microvascular ischemic change. Mild diffuse cerebral volume loss. Vascular: Atherosclerotic calcifications involving the large vessels of the skull base. No unexpected hyperdense vessel. Skull: Normal. Negative for fracture or focal lesion. Sinuses/Orbits: Mucosal thickening within the visualized right maxillary sinus and right sphenoid sinus. Scattered partial opacification within several mastoid air cells bilaterally. Orbital structures within normal limits. Other: Negative for scalp hematoma. CT CERVICAL SPINE FINDINGS Alignment: Unchanged grade 1 anterolisthesis C4 on C5, C5 on C6, and C7 on T1. No facet joint dislocation. Dens and lateral masses remain aligned. Skull base and vertebrae: No acute fracture. No primary bone  lesion or focal pathologic process. Soft tissues and spinal canal: No prevertebral fluid or swelling. No visible canal hematoma. Disc levels: Advanced multilevel degenerative changes within the cervical spine and visualized upper thoracic spine, not appreciably changed compared to the prior study. Upper chest: Centrilobular emphysema within the visualized lung apices. Other: Enlarged multinodular thyroid gland. Aortic and bilateral carotid atherosclerosis. IMPRESSION: 1. No acute intracranial findings. 2. Chronic microvascular ischemic changes and cerebral volume loss. 3. No evidence of acute fracture or traumatic subluxation of the cervical spine. 4. Advanced multilevel degenerative changes of the cervical spine, not appreciably changed compared to the prior study. 5. Right maxillary and sphenoid sinus disease. 6. Partial opacification within several mastoid air cells bilaterally. Correlate for signs and symptoms of mastoiditis. 7. Enlarged multinodular thyroid gland. Recommend thyroid ultrasound on a nonemergent basis (ref: J Am Coll Radiol. 2015 Feb;12(2): 143-50). Electronically Signed   By: Davina Poke D.O.   On: 09/29/2020 15:31   US Renal  Result Date: 09/29/2020 CLINICAL DATA:  79 year old female with acute renal insufficiency. EXAM: RENAL / URINARY TRACT ULTRASOUND COMPLETE COMPARISON:  Renal ultrasound dated 09/17/2019. FINDINGS: Right Kidney: Renal measurements: 7.1 x 3.3 x 3.2 cm = volume: 39 mL. The right kidney is atrophic and echogenic in keeping with chronic kidney disease. No hydronephrosis or shadowing stone. There is a subcentimeter upper pole cyst. Left Kidney: Renal measurements: 9.3 x 5.7 x 4.3 cm = volume: 120 mL. The left kidney is echogenic. No hydronephrosis or shadowing stone. There is a 1.4 cm interpolar cyst. Bladder: Appears normal for degree of bladder distention. Other: None. IMPRESSION: 1. Echogenic kidneys in keeping with chronic kidney disease. No hydronephrosis or  shadowing stone. 2. Stable right renal atrophy. 3. Bilateral renal cysts. Electronically Signed   By: Anner Crete M.D.   On: 09/29/2020 19:07    Labs: BMET Recent Labs  Lab 09/29/20 1406 09/29/20 2005 09/29/20 2040 09/30/20 1256  NA 136 135 137 133*  K 6.0* 6.7* 6.6* 5.4*  CL 110 111  --  108  CO2 13* 14*  --  17*  GLUCOSE 128* 92  --  100*  BUN 53* 51*  --  41*  CREATININE 2.81* 2.55*  --  2.30*  CALCIUM 9.1 9.3  --  9.0  PHOS  --  4.5  --   --    CBC Recent Labs  Lab 09/29/20 1406 09/29/20 2005 09/29/20 2040 09/30/20 0247  WBC 8.8 10.0  --  7.9  NEUTROABS 5.7  --   --   --   HGB 9.7* 9.9* 10.2* 8.9*  HCT 32.6* 31.7* 30.0* 28.3*  MCV 89.6 85.9  --  85.8  PLT 438* 450*  --  368    Medications:    . amLODipine  5 mg Oral Daily  . aspirin EC  81 mg Oral Daily  . cloNIDine  0.2 mg Oral QHS  . clopidogrel  75 mg Oral Daily  . donepezil  5 mg Oral Daily  . DULoxetine  30 mg Oral Daily  . ferrous sulfate  325 mg Oral Q breakfast  . heparin  5,000 Units Subcutaneous Q8H  . valproic acid  250 mg Oral QHS    Alyssa Shiley, MD 10/01/2020, 10:25 AM

## 2020-10-02 LAB — IRON AND TIBC
Iron: 96 ug/dL (ref 28–170)
Saturation Ratios: 32 % — ABNORMAL HIGH (ref 10.4–31.8)
TIBC: 300 ug/dL (ref 250–450)
UIBC: 204 ug/dL

## 2020-10-02 LAB — RENAL FUNCTION PANEL
Albumin: 2.8 g/dL — ABNORMAL LOW (ref 3.5–5.0)
Anion gap: 9 (ref 5–15)
BUN: 37 mg/dL — ABNORMAL HIGH (ref 8–23)
CO2: 20 mmol/L — ABNORMAL LOW (ref 22–32)
Calcium: 8.8 mg/dL — ABNORMAL LOW (ref 8.9–10.3)
Chloride: 104 mmol/L (ref 98–111)
Creatinine, Ser: 2.44 mg/dL — ABNORMAL HIGH (ref 0.44–1.00)
GFR, Estimated: 20 mL/min — ABNORMAL LOW (ref >60)
Glucose, Bld: 127 mg/dL — ABNORMAL HIGH (ref 70–99)
Phosphorus: 4.7 mg/dL — ABNORMAL HIGH (ref 2.5–4.6)
Potassium: 5 mmol/L (ref 3.5–5.1)
Sodium: 133 mmol/L — ABNORMAL LOW (ref 135–145)

## 2020-10-02 LAB — FERRITIN: Ferritin: 790 ng/mL — ABNORMAL HIGH (ref 11–307)

## 2020-10-02 MED ORDER — AMLODIPINE BESYLATE 10 MG PO TABS
10.0000 mg | ORAL_TABLET | Freq: Every day | ORAL | Status: DC
  Administered 2020-10-03: 10 mg via ORAL
  Filled 2020-10-02: qty 1

## 2020-10-02 MED ORDER — SODIUM BICARBONATE 650 MG PO TABS
650.0000 mg | ORAL_TABLET | Freq: Two times a day (BID) | ORAL | Status: DC
  Administered 2020-10-02 – 2020-10-03 (×3): 650 mg via ORAL
  Filled 2020-10-02 (×3): qty 1

## 2020-10-02 NOTE — Progress Notes (Signed)
  [facility] Progress Note   Assessment/ Plan:   1. Acute kidney Injury on chronic kidney disease stage IV (baseline creatinine suspected 1.8-2.2): Came in with what appears to be hemodynamically mediated acute kidney injury and multiple metabolic abnormalities that have improved with intravenous fluids and medical management.  Continue current IV fluids for the next 24 hours. 2.  Hyperkalemia: Secondary to acute kidney injury in this patient with history of renal artery stenosis.  Potassium level currently within normal range-on higher side; reevaluate with labs and sodium bicarbonate use.. 3.  Anion gap metabolic acidosis: Secondary to acute kidney injury, status post isotonic sodium bicarbonate and appears to be improving -will supplement with oral sodium bicarbonate. 4.  Hypertension: Significantly elevated blood pressures noted, restarted carvedilol and will continue amlodipine. 5.  Anemia: Without overt loss and downtrending hemoglobin/hematocrit likely from correction of initial hemoconcentration/falsely elevated H&H following intravenous fluids, will check iron saturation.  Subjective:   Denies any complaints, disappointed that she is not going home today.   Objective:   BP (!) 160/102 (BP Location: Left Arm)   Pulse 66   Temp 98 F (36.7 C)   Resp 16   Ht 5' (1.524 m)   Wt 48.5 kg   SpO2 97%   BMI 20.88 kg/m   Intake/Output Summary (Last 24 hours) at 10/02/2020 1127 Last data filed at 10/02/2020 0600 Gross per 24 hour  Intake 2232.52 ml  Output 1050 ml  Net 1182.52 ml   Weight change: 3 kg  Physical Exam: Gen: Comfortably resting in recliner CVS: Pulse regular rhythm, normal rate, S1 and S2 normal Resp: Anteriorly clear to auscultation, no distinct rales or rhonchi Abd: Soft, flat, nontender, bowel sounds normal Ext: No lower extremity edema on exam.  Imaging: No results found.  Labs: BMET Recent Labs  Lab 09/29/20 1406 09/29/20 2005  09/29/20 2040 09/30/20 1256 10/01/20 1050 10/02/20 0402  NA 136 135 137 133* 131* 133*  K 6.0* 6.7* 6.6* 5.4* 5.1 5.0  CL 110 111  --  108 104 104  CO2 13* 14*  --  17* 17* 20*  GLUCOSE 128* 92  --  100* 96 127*  BUN 53* 51*  --  41* 40* 37*  CREATININE 2.81* 2.55*  --  2.30* 2.16* 2.44*  CALCIUM 9.1 9.3  --  9.0 8.8* 8.8*  PHOS  --  4.5  --   --   --  4.7*   CBC Recent Labs  Lab 09/29/20 1406 09/29/20 2005 09/29/20 2040 09/30/20 0247  WBC 8.8 10.0  --  7.9  NEUTROABS 5.7  --   --   --   HGB 9.7* 9.9* 10.2* 8.9*  HCT 32.6* 31.7* 30.0* 28.3*  MCV 89.6 85.9  --  85.8  PLT 438* 450*  --  368    Medications:    . amLODipine  5 mg Oral Daily  . aspirin EC  81 mg Oral Daily  . carvedilol  12.5 mg Oral BID WC  . cloNIDine  0.2 mg Oral QHS  . clopidogrel  75 mg Oral Daily  . donepezil  5 mg Oral Daily  . DULoxetine  30 mg Oral Daily  . ferrous sulfate  325 mg Oral Q breakfast  . heparin  5,000 Units Subcutaneous Q8H  . valproic acid  250 mg Oral QHS    Elmarie Shiley, MD 10/02/2020, 11:27 AM

## 2020-10-02 NOTE — Evaluation (Addendum)
Occupational Therapy Evaluation Patient Details Name: Alyssa [name] MRN: [id number] DOB: 03-10-1941 Today's Date: 10/02/2020    History of Present Illness 79yo female presenting to the ED after a syncopal episode at home. Admit with hx of syncope, hyperkalemia, and AKI on CKD stage IV. 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 Pt currently requiring Min A for UB ADLs, Mod A for LB ADLs, and Min-Mod A for functional transfers. Evaluation limited by elevated BP; notified RN who arrived to provide BP medication. See general comments for BP values. Pt would benefit from further acute OT to facilitate safe dc. Recommend dc to home with HHOT for further OT to optimize safety, independence with ADLs, and return to PLOF.      Follow Up Recommendations  Home health OT;Supervision/Assistance - 24 hour (Declining SNF)    Equipment Recommendations  3 in 1 bedside commode    Recommendations for Other Services PT consult     Precautions / Restrictions Precautions Precautions: Fall;Other (comment) Precaution Comments: watch BP Restrictions Weight Bearing Restrictions: No      Mobility Bed Mobility Overal bed mobility: Needs Assistance Bed Mobility: Supine to Sit     Supine to sit: Supervision     General bed mobility comments: S for safety/line management, no physical assist given    Transfers Overall transfer level: Needs assistance Equipment used: 1 person hand held assist Transfers: Sit to/from Stand;Stand Pivot Transfers Sit to Stand: Min assist Stand pivot transfers: Mod assist       General transfer comment: MinA to boost to upright standing, then ModA to maintain balance and prevent fall while pivoting to chair- ended up trying to sit somewhat prematurely and  needed a little extra help to turn hips all the way around    Balance Overall balance assessment: Needs assistance Sitting-balance support: Feet supported;Bilateral upper extremity supported Sitting balance-Leahy Scale: Good     Standing balance support: Single extremity supported;During functional activity Standing balance-Leahy Scale: Poor Standing balance comment: Min-ModA to maintain balance without BUE support                           ADL either performed or assessed with clinical judgement   ADL Overall ADL's : Needs assistance/impaired Eating/Feeding: Set up;Sitting;Bed level   Grooming: Set up;Sitting;Bed level;Supervision/safety   Upper Body Bathing: Minimal assistance;Sitting   Lower Body Bathing: Moderate assistance;Sit to/from stand   Upper Body Dressing : Minimal assistance;Sitting   Lower Body Dressing: Moderate assistance;Sit to/from stand   Toilet Transfer: Moderate assistance;+2 for safety/equipment;Stand-pivot (simulated to recliner)           Functional mobility during ADLs: Moderate assistance;+2 for safety/equipment (stand pivot) General ADL Comments: Pt with decreased activity tolerance, balance, and strength. Eval limited as BP elevated; RN notified and arriving at end to provide meds     Vision         Perception     Praxis      Pertinent Vitals/Pain Pain Assessment: No/denies pain     Hand Dominance Right   Extremity/Trunk Assessment Upper Extremity Assessment Upper Extremity Assessment: Generalized weakness   Lower Extremity Assessment Lower Extremity Assessment: Defer to PT evaluation   Cervical / Trunk Assessment Cervical / Trunk Assessment: Kyphotic   Communication Communication Communication: No difficulties  Cognition Arousal/Alertness: Awake/alert Behavior During Therapy: WFL for tasks assessed/performed;Impulsive Overall Cognitive Status: Within Functional Limits for tasks assessed                                  General Comments: follows cues and commands well, a bit impulsive at times but question if this is baseline   General Comments  BP supine 181/99 (121) and 172/101 (121). Sitting 175/102 (123), seated in recliner after SPT 176/104 (123). Seated in recliner with BLE elevated 185/99 (125), seated in recliner with BLEs elevated after 3 minutes 174/101 (124)    Exercises     Shoulder Instructions      Home Living Family/patient expects to be discharged to:: Private residence Living Arrangements: Spouse/significant other Available Help at Discharge: Available 24 hours/day Type of Home: House Home Access: Stairs to enter [employer] of Steps: 3 Entrance Stairs-Rails: Right;Left;Can reach both Home Layout: One level     Bathroom Shower/Tub: Teacher, early years/pre: Handicapped height Bathroom Accessibility: Yes How Accessible: Accessible via walker Home Equipment: [place] - 2 wheels;Walker - 4 wheels;Tub bench   Additional Comments: 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      Prior Functioning/Environment Level of Independence: Needs assistance  Gait / Transfers Assistance Needed: pt ambulates modI for household and limited community distances with use of 4 wheeled walker ADL's / Homemaking Assistance Needed: pt is independent with ADLs, caretaker assists with IADLs   Comments: reports using a Rollator for mobility. Does her own ADLs and friend helps with IADLs/driving.        OT Problem List: Decreased strength;Impaired balance (sitting and/or standing);Decreased range of motion;Decreased activity tolerance;Decreased knowledge of precautions;Decreased knowledge of use of DME or AE      OT Treatment/Interventions: Self-care/ADL training;Therapeutic exercise;DME and/or AE instruction;Energy conservation;Therapeutic activities;Patient/family education    OT Goals(Current goals can be found in the care plan  section) Acute Rehab OT Goals Patient Stated Goal: go home OT Goal Formulation: With patient Time For Goal Achievement: 10/16/20 Potential to Achieve Goals: Good  OT Frequency: Min 2X/week   Barriers to D/C:            Co-evaluation              AM-PAC OT "6 Clicks" Daily Activity     Outcome Measure Help from another person eating meals?: None Help from another person taking care of personal grooming?: A Little Help from another person toileting, which includes using toliet, bedpan, or urinal?: A Lot Help from another person bathing (including washing, rinsing, drying)?: A Lot Help from another person to put on and taking off regular upper body clothing?: A Little Help from another person to put on and taking off regular lower body clothing?: A Lot 6 Click Score: 16   End of Session Nurse Communication: Mobility status  Activity Tolerance: Other (comment) (Limited by BP) Patient left: in chair;with call bell/phone within reach;with chair alarm set;with nursing/sitter in room  OT Visit Diagnosis: Unsteadiness on feet (R26.81);Other abnormalities of gait and mobility (R26.89);Muscle weakness (generalized) (M62.81)                Time: 3614-4315 OT Time Calculation (min): 23 min Charges:  OT General Charges $OT Visit: 1 Visit OT Evaluation $OT Eval Moderate Complexity: Do[place], OTR/L Acute Rehab Pager: [phone] Office: [place] 10/02/2020, 12:29  PM

## 2020-10-02 NOTE — Evaluation (Signed)
Physical Therapy Evaluation Patient Details Name: Alyssa [name] MRN: [id number] DOB: 06-Jul-1941 Today's Date: 10/02/2020   History of Present Illness  79yo female presenting to the ED after a syncopal episode at home. Admit with hx of syncope, hyperkalemia, and AKI on CKD stage IV. PMH respiratory failure, CKD, HLD, HTN, peripheral neuropathy, PVD, femoral to femoral bypass, hx THR  Clinical Impression   Patient received in bed, pleasant and cooperative with therapy. Did well with bed mobility but needed as much as Min-ModA assist for transferring OOB to recliner due to gross weakness and balance impairment, very tremulous in standing today. Session limited by BP elevation (170s-180s/90s-100s with MAP consistently in the 120s and symptomatic in standing), RN aware. Left up in recliner with all needs met, chair alarm active and RN attending. Would benefit from SNF but is intent on going home so will need HHPT and 24/7A in this case.     Follow Up Recommendations Home health PT;Supervision/Assistance - 24 hour;Other (comment) (refusing SNF)    Equipment Recommendations  3in1 (PT);Wheelchair (measurements PT);Wheelchair cushion (measurements PT)    Recommendations for Other Services       Precautions / Restrictions Precautions Precautions: Fall;Other (comment) Precaution Comments: watch BP Restrictions Weight Bearing Restrictions: No      Mobility  Bed Mobility Overal bed mobility: Needs Assistance Bed Mobility: Supine to Sit     Supine to sit: Supervision     General bed mobility comments: S for safety/line management, no physical assist given    Transfers Overall transfer level: Needs assistance Equipment used: 1 person hand held assist Transfers: Sit to/from Stand;Stand Pivot Transfers Sit to Stand: Min assist Stand pivot transfers: Mod assist       General transfer comment: MinA to boost to upright standing, then ModA to maintain balance and prevent fall  while pivoting to chair- ended up trying to sit somewhat prematurely and needed a little extra help to turn hips all the way around  Ambulation/Gait             General Gait Details: deferred- elevated BP, symptomatic in standing  Stairs            Wheelchair Mobility    Modified Rankin (Stroke Patients Only)       Balance Overall balance assessment: Needs assistance Sitting-balance support: Feet supported;Bilateral upper extremity supported Sitting balance-Leahy Scale: Good     Standing balance support: Single extremity supported;During functional activity Standing balance-Leahy Scale: Poor Standing balance comment: Min-ModA to maintain balance without BUE support                             Pertinent Vitals/Pain Pain Assessment: No/denies pain    Home Living Family/patient expects to be discharged to:: Private residence Living Arrangements: Spouse/significant other Available Help at Discharge: Available 24 hours/day Type of Home: House Home Access: Stairs to enter Entrance Stairs-Rails: Right;Left;Can reach both Entrance Stairs-Number of Steps: 3 Home Layout: One level Home Equipment: Walker - 2 wheels;Walker - 4 wheels;Tub bench Additional Comments: 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    Prior Function Level of Independence: Needs assistance               Hand Dominance        Extremity/Trunk Assessment   Upper Extremity Assessment Upper Extremity Assessment: Defer to OT evaluation    Lower Extremity Assessment Lower Extremity Assessment: Generalized weakness    Cervical /  Trunk Assessment Cervical / Trunk Assessment: Kyphotic  Communication      Cognition Arousal/Alertness: Awake/alert Behavior During Therapy: WFL for tasks assessed/performed;Impulsive Overall Cognitive Status: Within Functional Limits for tasks assessed                                 General Comments:  follows cues and commands well, a bit impulsive at times but question if this is baseline      General Comments General comments (skin integrity, edema, etc.): supine BP 181/99 (121) and 172/101 (121). Sitting 175/102 (123); recliner 176/104 (123). Recliner elevated 185/99 (125), in recliner with BLEs elevated after 3 minutes 174/101 (124)    Exercises     Assessment/Plan    PT Assessment Patient needs continued PT services  PT Problem List Decreased strength;Decreased activity tolerance;Decreased safety awareness;Decreased balance;Decreased mobility;Decreased coordination       PT Treatment Interventions DME instruction;Balance training;Gait training;Stair training;Functional mobility training;Patient/family education;Therapeutic activities;Therapeutic exercise    PT Goals (Current goals can be found in the Care Plan section)  Acute Rehab PT Goals Patient Stated Goal: go home PT Goal Formulation: With patient Time For Goal Achievement: 10/16/20 Potential to Achieve Goals: Fair    Frequency Min 3X/week   Barriers to discharge        Co-evaluation               AM-PAC PT "6 Clicks" Mobility  Outcome Measure Help needed turning from your back to your side while in a flat bed without using bedrails?: None Help needed moving from lying on your back to sitting on the side of a flat bed without using bedrails?: None Help needed moving to and from a bed to a chair (including a wheelchair)?: A Lot Help needed standing up from a chair using your arms (e.g., wheelchair or bedside chair)?: A Little Help needed to walk in hospital room?: A Lot Help needed climbing 3-5 steps with a railing? : A Lot 6 Click Score: 17    End of Session   Activity Tolerance: Treatment limited secondary to medical complications (Comment);Other (comment) (symptomatic elevated BP) Patient left: in chair;with call bell/phone within reach;with chair alarm set Nurse Communication: Mobility status;Other  (comment) (elevated blood pressure) PT Visit Diagnosis: Unsteadiness on feet (R26.81);Muscle weakness (generalized) (M62.81);Difficulty in walking, not elsewhere classified (R26.2)    Time: 7824-2353 PT Time Calculation (min) (ACUTE ONLY): 23 min   Charges:   PT Evaluation $PT Eval Moderate Complexity: 1 Mod (co-eval with OT)          Windell Norfolk, DPT, PN1   Supplemental Physical Therapist [place]    Pager [phone] Acute Rehab Office [phone]

## 2020-10-02 NOTE — TOC Initial Note (Addendum)
Transition of Care [facility]) - Initial/Assessment Note    Patient Details  Name: NIMISHA RATHEL MRN: [id number] Date of Birth: 02/17/1941  Transition of Care [facility]) CM/SW Contact:    Bartholomew Crews, RN Phone Number: [phone] 10/02/2020, 4:24 PM  Clinical Narrative:                  Spoke with patient at the bedside to discuss transition planning. PTA home with spouse. Independent with basic adls. Stated she has all DME needed. Agreeable to [facility] PT. Discussed choice of home health agency. She stated that [facility] manages her medication. Referral arranged with [facility]. Patient will need HH orders for PT with Face to Face. Patient stated that she will have transportation home when ready for discharge. TOC following for transition needs.   Expected Discharge Plan: [place] Barriers to Discharge: Continued Medical Work up   Patient Goals and CMS Choice Patient states their goals for this hospitalization and ongoing recovery are:: return home with husband CMS Medicare.gov Compare Post Acute Care list provided to:: Patient Choice offered to / list presented to : Patient  Expected Discharge Plan and Services Expected Discharge Plan: [place] In-house Referral: NA Discharge Planning Services: CM Consult Post Acute Care Choice: [place] arrangements for the past 2 months: Single Family Home                 DME Arranged: N/A DME Agency: NA       HH Arranged: PT          Prior Living Arrangements/Services Living arrangements for the past 2 months: Single Family Home Lives with:: Self,Spouse Patient language and need for interpreter reviewed:: Yes Do you feel safe going back to the place where you live?: Yes      Need for Family Participation in Patient Care: Yes (Comment) Care giver support system in place?: Yes (comment) Current home services: DME (walker, 3N1) Criminal Activity/Legal Involvement Pertinent to Current  Situation/Hospitalization: No - Comment as needed  Activities of Daily Living Home Assistive Devices/Equipment: Walker (specify type) ADL Screening (condition at time of admission) Patient's cognitive ability adequate to safely complete daily activities?: Yes Is the patient deaf or have difficulty hearing?: No Does the patient have difficulty seeing, even when wearing glasses/contacts?: No Does the patient have difficulty concentrating, remembering, or making decisions?: No Patient able to express need for assistance with ADLs?: Yes Does the patient have difficulty dressing or bathing?: No Independently performs ADLs?: Yes (appropriate for developmental age) Does the patient have difficulty walking or climbing stairs?: Yes Weakness of Legs: Both Weakness of Arms/Hands: None  Permission Sought/Granted                  Emotional Assessment Appearance:: Appears stated age Attitude/Demeanor/Rapport: Engaged Affect (typically observed): Accepting Orientation: : Oriented to Self,Oriented to  Time,Oriented to Place,Oriented to Situation Alcohol / Substance Use: Not Applicable Psych Involvement: No (comment)  Admission diagnosis:  Hyperkalemia [E87.5] Syncope [R55] AKI (acute kidney injury) ([place]) [N17.9] Near syncope [R55] Patient Active Problem List   Diagnosis Date Noted  . COPD  GOLD ? still smoking  09/26/2020  . Cigarette smoker 09/26/2020  . Shortness of breath 07/06/2020  . Essential hypertension 07/06/2020  . Abdominal pain 07/06/2020  . Elevated INR 07/06/2020  . Acute respiratory failure due to COVID-19 ([place]) 06/02/2020  . Acute renal failure superimposed on chronic kidney disease ([place]) 02/06/2020  . Leukocytosis 02/06/2020  . Thrombocytosis 02/06/2020  .  Palliative care encounter   . Constipation 01/05/2020  . Protein-calorie malnutrition, severe ([place]) 01/04/2020  . Acute on chronic renal failure ([place]) 01/04/2020  . Hyperkalemia 01/03/2020  . AKI (acute kidney  injury) ([place]) 01/03/2020  . Acute respiratory failure with hypoxia ([place]) 11/01/2019  . UTI (urinary tract infection) 11/01/2019  . CAP (community acquired pneumonia) 11/01/2019  . Acute cystitis without hematuria   . Encephalopathy 09/18/2019  . SAH (subarachnoid hemorrhage) ([place]) 09/18/2019  . Dementia ([place]) 09/18/2019  . Metabolic acidosis 15/40/0867  . ARF (acute renal failure) ([place]) 09/17/2019  . Acute encephalopathy 09/17/2019  . Abnormal CT scan, colon   . Diarrhea   . Diverticulitis 06/07/2019  . Depression with anxiety 06/07/2019  . Volume overload 03/23/2019  . Edema 03/23/2019  . Acute renal failure (ARF) ([place]) 03/08/2019  . Hypokalemia 03/08/2019  . Palliative care by specialist   . Goals of care, counseling/discussion   . Altered mental status   . Encephalopathy, toxic 12/24/2018  . Aspiration pneumonia of both lower lobes due to gastric secretions ([place]) 12/24/2018  . Non-ST elevation (NSTEMI) myocardial infarction ([place])   . Chest pain   . Dyspnea on exertion   . Elevated troponin level 12/09/2018  . Chronic migraine 03/27/2018  . Lumbar radiculopathy 03/27/2018  . Gait abnormality 03/27/2018  . CKD (chronic kidney disease) stage 4, GFR 15-29 ml/min (HCC) 12/28/2016  . Anemia 12/28/2016  . Chronic pain syndrome 12/28/2016  . Benzodiazepine withdrawal without complication ([place]) 61/95/0932  . Chronic right shoulder pain 12/06/2016  . Fall 09/17/2016  . Fracture of humeral head, closed, right, initial encounter 09/17/2016  . Rib fractures 09/17/2016  . Multiple falls 09/17/2016  . Severe muscle deconditioning 09/17/2016  . Failure to thrive in adult 09/17/2016  . Melena 04/25/2016  . Transient hypotension 04/25/2016  . Anxiety state 04/25/2016  . Tobacco abuse 04/25/2016  . Hyperlipidemia 04/25/2016  . Syncope 04/24/2016  . Near syncope 04/24/2016  . Gait difficulty 01/12/2016  . Foot drop, left 01/12/2016  . Severe recurrent major depression without  psychotic features ([place]) 08/28/2015  . Spondylolisthesis at L4-L5 level 07/30/2015  . PVD (peripheral vascular disease) ([place]) 07/29/2014  . Weakness-Bilateral arm/leg 07/29/2014  . Numbness-left leg 07/29/2014  . Swelling of limb-Legs 07/29/2014  . Atherosclerosis of native arteries of the extremities with intermittent claudication 09/30/2011   PCP:  [name], DO Pharmacy:   [facility] [place], [place] [address] Phone: [phone] Fax: [phone]  [facility] [place], [place] - [address] [place] [address] Phone: [phone] Fax: [place], [place] - [place] - [address]een [address] [place] [address] Phone: [phone] Fax: [phone]     Social Determinants of Health ([place]) Interventions    Readmission Risk Interventions Readmission Risk Prevention Plan 07/10/2020 12/26/2018  Transportation Screening Complete Complete  Medication Review (RN Care Manager) Complete Complete  PCP or Specialist appointment within 3-5 days of discharge - Not Complete  PCP/Specialist Appt Not Complete comments - pt for snf. Snf MD to see as needed.  [place] or Home Care Consult Complete Not Complete  HRI or Home Care Consult Pt Refusal Comments - NA  SW Recovery Care/Counseling Consult Complete Not Complete  SW Consult Not Complete Comments - NA  Palliative Care Screening  Not Applicable Complete  Skilled Nursing Facility Not Applicable Complete  Some recent data might be hidden

## 2020-10-02 NOTE — Progress Notes (Addendum)
Progress Note    Alyssa [name]  HQI:[id number] DOB: 1941-10-10  DOA: 09/29/2020 PCP: [name], DO      Brief Narrative:    Medical records reviewed and are as summarized below:  Alyssa [name] is a 79 y.o. female known CKD4, HTN, PAD, HTN, anemia of chronic disease--supposed to be on chronic bicarb, dementia, peripheral vascular disease with femorofemoral bypass 2013, chronic rib fracture ,prior Covid infection admitted 8/16 through 8/21 for treatment, prior admission for syncope secondary to dehydration and anorexia.  She was brought to the hospital because of lightheadedness and syncope.  Reportedly, she was going to the bathroom when she felt lightheaded and passed out.  Fortunately, her husband was able to catch her so she did not fall.  No history of seizures.     Assessment/Plan:   Principal Problem:   AKI (acute kidney injury) ([place]) Active Problems:   PVD (peripheral vascular disease) ([place])   Syncope   CKD (chronic kidney disease) stage 4, GFR 15-29 ml/min (HCC)   Hyperkalemia   Body mass index is 20.88 kg/m.    S/p syncope: Likely due to volume depletion.  PT recommended home health therapy at discharge.  AKI on CKD stage IV complicated by hyperkalemia and non-anion gap metabolic acidosis: Creatinine is slightly getting worse but potassium and bicarbonate levels are improving.  Continue IV sodium bicarbonate infusion for another 24 hours as recommended by nephrologist.  Monitor BMP.  Follow-up with nephrologist further recommendations.    Hypertension: Continue antihypertensives  PVD with prior femorofemoral bypass: Continue Plavix and aspirin  Dementia: Continue psychotropics and supportive care.  Other comorbidities include chronic rib fractures, previous COVID-19 infection in August 2021   Diet Order            Diet Heart Room service appropriate? Yes; Fluid consistency: Thin  Diet effective now                     Consultants:  Nephrologist  Procedures:  None    Medications:   . [START ON 10/03/2020] amLODipine  10 mg Oral Daily  . aspirin EC  81 mg Oral Daily  . carvedilol  12.5 mg Oral BID WC  . cloNIDine  0.2 mg Oral QHS  . clopidogrel  75 mg Oral Daily  . donepezil  5 mg Oral Daily  . DULoxetine  30 mg Oral Daily  . ferrous sulfate  325 mg Oral Q breakfast  . heparin  5,000 Units Subcutaneous Q8H  . sodium bicarbonate  650 mg Oral BID  . valproic acid  250 mg Oral QHS   Continuous Infusions: . sodium bicarbonate in D5W 1000 mL infusion 125 mL/hr at 10/02/20 1146     Anti-infectives (From admission, onward)   None             Family Communication/Anticipated D/C date and plan/Code Status   DVT prophylaxis: heparin injection 5,000 Units Start: 09/29/20 2200     Code Status: Full Code  Family Communication: None Disposition Plan:    Status is: Inpatient  Remains inpatient appropriate because:IV treatments appropriate due to intensity of illness or inability to take PO   Dispo: The patient is from: Home              Anticipated d/c is to: Home              Anticipated d/c date is: 1 day  Patient currently is not medically stable to d/c.                 Subjective:    Interval events noted.  No shortness of breath or chest pain.  No leg swelling.  Objective:    Vitals:   10/01/20 1737 10/01/20 2057 10/02/20 0413 10/02/20 0959  BP: (!) 143/85 (!) 167/86 (!) 170/98 (!) 160/102  Pulse: 71 77 67 66  Resp: 18 18 16 16   Temp: 98.2 F (36.8 C) 98.5 F (36.9 C) 98.2 F (36.8 C) 98 F (36.7 C)  TempSrc: Oral Oral Oral   SpO2: 98% 98% 96% 97%  Weight:  48.5 kg    Height:       No data found.   Intake/Output Summary (Last 24 hours) at 10/02/2020 1214 Last data filed at 10/02/2020 0600 Gross per 24 hour  Intake 2232.52 ml  Output 1050 ml  Net 1182.52 ml   Filed Weights   09/30/20 2004 10/01/20 2057   Weight: 45.5 kg 48.5 kg    Exam:  GEN: NAD SKIN: Warm and dry EYES: No pallor or icterus ENT: MMM CV: RRR PULM: CTA B ABD: soft, ND, NT, +BS CNS: AAO x 3, non focal EXT: No edema or tenderness     Data Reviewed:   I have personally reviewed following labs and imaging studies:  Labs: Labs show the following:   Basic Metabolic Panel: Recent Labs  Lab 09/29/20 1406 09/29/20 2005 09/29/20 2040 09/30/20 1256 10/01/20 1050 10/02/20 0402  NA 136 135 137 133* 131* 133*  K 6.0* 6.7* 6.6* 5.4* 5.1 5.0  CL 110 111  --  108 104 104  CO2 13* 14*  --  17* 17* 20*  GLUCOSE 128* 92  --  100* 96 127*  BUN 53* 51*  --  41* 40* 37*  CREATININE 2.81* 2.55*  --  2.30* 2.16* 2.44*  CALCIUM 9.1 9.3  --  9.0 8.8* 8.8*  MG 2.5*  --   --   --   --   --   PHOS  --  4.5  --   --   --  4.7*   GFR Estimated Creatinine Clearance: 13.9 mL/min (A) (by C-G formula based on SCr of 2.44 mg/dL (H)). Liver Function Tests: Recent Labs  Lab 09/29/20 1406 09/29/20 2005 09/30/20 1256 10/02/20 0402  AST 17  --  15  --   ALT 14  --  11  --   ALKPHOS 77  --  79  --   BILITOT 0.4  --  0.6  --   PROT 6.7  --  6.2*  --   ALBUMIN 3.4* 3.4* 3.1* 2.8*   No results for input(s): LIPASE, AMYLASE in the last 168 hours. No results for input(s): AMMONIA in the last 168 hours. Coagulation profile No results for input(s): INR, PROTIME in the last 168 hours.  CBC: Recent Labs  Lab 09/29/20 1406 09/29/20 2005 09/29/20 2040 09/30/20 0247  WBC 8.8 10.0  --  7.9  NEUTROABS 5.7  --   --   --   HGB 9.7* 9.9* 10.2* 8.9*  HCT 32.6* 31.7* 30.0* 28.3*  MCV 89.6 85.9  --  85.8  PLT 438* 450*  --  368   Cardiac Enzymes: No results for input(s): CKTOTAL, CKMB, CKMBINDEX, TROPONINI in the last 168 hours. BNP (last 3 results) No results for input(s): PROBNP in the last 8760 hours. CBG: No results for input(s): GLUCAP in the last 168  hours. D-Dimer: No results for input(s): DDIMER in the last 72  hours. Hgb A1c: No results for input(s): HGBA1C in the last 72 hours. Lipid Profile: No results for input(s): CHOL, HDL, LDLCALC, TRIG, CHOLHDL, LDLDIRECT in the last 72 hours. Thyroid function studies: No results for input(s): TSH, T4TOTAL, T3FREE, THYROIDAB in the last 72 hours.  Invalid input(s): FREET3 Anemia work up: No results for input(s): VITAMINB12, FOLATE, FERRITIN, TIBC, IRON, RETICCTPCT in the last 72 hours. Sepsis Labs: Recent Labs  Lab 09/29/20 1406 09/29/20 2005 09/30/20 0247  WBC 8.8 10.0 7.9    Microbiology Recent Results (from the past 240 hour(s))  Culture, Urine     Status: Abnormal   Collection Time: 09/29/20 11:53 PM   Specimen: Urine, Random  Result Value Ref Range Status   Specimen Description URINE, RANDOM  Final   Special Requests   Final    NONE Performed at [facility], [address] [place], [place] [address]    Culture 20,000 COLONIES/mL ENTEROBACTER AEROGENES (A)  Final   Report Status 10/01/2020 FINAL  Final   Organism ID, Bacteria ENTEROBACTER AEROGENES (A)  Final      Susceptibility   Enterobacter aerogenes - MIC*    CEFAZOLIN >=64 RESISTANT Resistant     CEFEPIME <=0.12 SENSITIVE Sensitive     CEFTRIAXONE <=0.25 SENSITIVE Sensitive     CIPROFLOXACIN <=0.25 SENSITIVE Sensitive     GENTAMICIN <=1 SENSITIVE Sensitive     IMIPENEM 1 SENSITIVE Sensitive     NITROFURANTOIN 128 RESISTANT Resistant     TRIMETH/SULFA <=20 SENSITIVE Sensitive     PIP/TAZO <=4 SENSITIVE Sensitive     * 20,000 COLONIES/mL ENTEROBACTER AEROGENES  Resp Panel by RT-PCR (Flu A&B, Covid) Nasopharyngeal Swab     Status: None   Collection Time: 09/30/20  2:00 PM   Specimen: Nasopharyngeal Swab; Nasopharyngeal(NP) swabs in vial transport medium  Result Value Ref Range Status   SARS Coronavirus 2 by RT PCR NEGATIVE NEGATIVE Final    Comment: (NOTE) SARS-CoV-2 target nucleic acids are NOT DETECTED.  The SARS-CoV-2 RNA is generally detectable in upper  respiratory specimens during the acute phase of infection. The lowest concentration of SARS-CoV-2 viral copies this assay can detect is 138 copies/mL. A negative result does not preclude SARS-Cov-2 infection and should not be used as the sole basis for treatment or other patient management decisions. A negative result may occur with  improper specimen collection/handling, submission of specimen other than nasopharyngeal swab, presence of viral mutation(s) within the areas targeted by this assay, and inadequate number of viral copies(<138 copies/mL). A negative result must be combined with clinical observations, patient history, and epidemiological information. The expected result is Negative.  Fact Sheet for Patients:  EntrepreneurPulse.com.au  Fact Sheet for Healthcare Providers:  IncredibleEmployment.be  This test is no t yet approved or cleared by the [place] FDA and  has been authorized for detection and/or diagnosis of SARS-CoV-2 by FDA under an Emergency Use Authorization (EUA). This EUA will remain  in effect (meaning this test can be used) for the duration of the COVID-19 declaration under Section 564(b)(1) of the Act, 21 U.S.C.section 360bbb-3(b)(1), unless the authorization is terminated  or revoked sooner.       Influenza A by PCR NEGATIVE NEGATIVE Final   Influenza B by PCR NEGATIVE NEGATIVE Final    Comment: (NOTE) The Xpert Xpress SARS-CoV-2/FLU/RSV plus assay is intended as an aid in the diagnosis of influenza from Nasopharyngeal swab specimens and should not be  used as a sole basis for treatment. Nasal washings and aspirates are unacceptable for Xpert Xpress SARS-CoV-2/FLU/RSV testing.  Fact Sheet for Patients: EntrepreneurPulse.com.au  Fact Sheet for Healthcare Providers: IncredibleEmployment.be  This test is not yet approved or cleared by the [place] FDA and has been  authorized for detection and/or diagnosis of SARS-CoV-2 by FDA under an Emergency Use Authorization (EUA). This EUA will remain in effect (meaning this test can be used) for the duration of the COVID-19 declaration under Section 564(b)(1) of the Act, 21 U.S.C. section 360bbb-3(b)(1), unless the authorization is terminated or revoked.  Performed at [facility], [place] [address], [place],  [address]     Procedures and diagnostic studies:  No results found.             LOS: 1 day   Taje Littler  [facility]   Pager on [email or web address]. If 7PM-7AM, please contact night-coverage at [email or web address]     10/02/2020, 12:14 PM

## 2020-10-03 LAB — CBC WITH DIFFERENTIAL/PLATELET
Abs Immature Granulocytes: 0.03 10*3/uL (ref 0.00–0.07)
Basophils Absolute: 0.1 10*3/uL (ref 0.0–0.1)
Basophils Relative: 1 %
Eosinophils Absolute: 0.3 10*3/uL (ref 0.0–0.5)
Eosinophils Relative: 4 %
HCT: 24.9 % — ABNORMAL LOW (ref 36.0–46.0)
Hemoglobin: 8.3 g/dL — ABNORMAL LOW (ref 12.0–15.0)
Immature Granulocytes: 0 %
Lymphocytes Relative: 34 %
Lymphs Abs: 2.4 10*3/uL (ref 0.7–4.0)
MCH: 26.8 pg (ref 26.0–34.0)
MCHC: 33.3 g/dL (ref 30.0–36.0)
MCV: 80.3 fL (ref 80.0–100.0)
Monocytes Absolute: 0.8 10*3/uL (ref 0.1–1.0)
Monocytes Relative: 12 %
Neutro Abs: 3.4 10*3/uL (ref 1.7–7.7)
Neutrophils Relative %: 49 %
Platelets: 330 10*3/uL (ref 150–400)
RBC: 3.1 MIL/uL — ABNORMAL LOW (ref 3.87–5.11)
RDW: 17.3 % — ABNORMAL HIGH (ref 11.5–15.5)
WBC: 6.9 10*3/uL (ref 4.0–10.5)
nRBC: 0 % (ref 0.0–0.2)

## 2020-10-03 LAB — RENAL FUNCTION PANEL
Albumin: 2.7 g/dL — ABNORMAL LOW (ref 3.5–5.0)
Anion gap: 12 (ref 5–15)
BUN: 36 mg/dL — ABNORMAL HIGH (ref 8–23)
CO2: 19 mmol/L — ABNORMAL LOW (ref 22–32)
Calcium: 8.4 mg/dL — ABNORMAL LOW (ref 8.9–10.3)
Chloride: 100 mmol/L (ref 98–111)
Creatinine, Ser: 2.22 mg/dL — ABNORMAL HIGH (ref 0.44–1.00)
GFR, Estimated: 22 mL/min — ABNORMAL LOW (ref >60)
Glucose, Bld: 148 mg/dL — ABNORMAL HIGH (ref 70–99)
Phosphorus: 4.8 mg/dL — ABNORMAL HIGH (ref 2.5–4.6)
Potassium: 4.6 mmol/L (ref 3.5–5.1)
Sodium: 131 mmol/L — ABNORMAL LOW (ref 135–145)

## 2020-10-03 MED ORDER — ROSUVASTATIN CALCIUM 40 MG PO TABS: 40.0000 mg | ORAL_TABLET | Freq: Every day | ORAL | Status: DC

## 2020-10-03 NOTE — Progress Notes (Signed)
DISCHARGE NOTE HOME DELBRA ZELLARS to be discharged Home per MD order. Discussed prescriptions and follow up appointments with the patient. Prescriptions given to patient; medication list explained in detail. Patient verbalized understanding.  Skin clean, dry and intact without evidence of skin break down, no evidence of skin tears noted. IV catheter discontinued intact. Site without signs and symptoms of complications. Dressing and pressure applied. Pt denies pain at the site currently. No complaints noted.  Patient free of lines, drains, and wounds.   An After Visit Summary (AVS) was printed and given to the patient. Patient escorted via wheelchair, and discharged home via private auto.  Dolores Hoose, RN

## 2020-10-03 NOTE — Plan of Care (Signed)
  Problem: Activity: Goal: Risk for activity intolerance will decrease Outcome: Progressing   Problem: Activity: Goal: Activity intolerance will improve Outcome: Progressing

## 2020-10-03 NOTE — Plan of Care (Signed)
  Problem: Education: Goal: Knowledge of General Education information will improve Description: Including pain rating scale, medication(s)/side effects and non-pharmacologic comfort measures Outcome: Adequate for Discharge   Problem: Health Behavior/Discharge Planning: Goal: Ability to manage health-related needs will improve Outcome: Adequate for Discharge   Problem: Clinical Measurements: Goal: Will remain free from infection Outcome: Adequate for Discharge   Problem: Activity: Goal: Risk for activity intolerance will decrease 10/03/2020 1419 by Dolores Hoose, RN Outcome: Adequate for Discharge 10/03/2020 0956 by Dolores Hoose, RN Outcome: Progressing   Problem: Elimination: Goal: Will not experience complications related to bowel motility Outcome: Adequate for Discharge Goal: Will not experience complications related to urinary retention Outcome: Adequate for Discharge   Problem: Skin Integrity: Goal: Risk for impaired skin integrity will decrease Outcome: Adequate for Discharge   Problem: Education: Goal: Knowledge of disease and its progression will improve Outcome: Adequate for Discharge   Problem: Health Behavior/Discharge Planning: Goal: Ability to manage health-related needs will improve Outcome: Adequate for Discharge   Problem: Clinical Measurements: Goal: Complications related to the disease process or treatment will be avoided or minimized Outcome: Adequate for Discharge   Problem: Activity: Goal: Activity intolerance will improve 10/03/2020 1419 by Dolores Hoose, RN Outcome: Adequate for Discharge 10/03/2020 0956 by Dolores Hoose, RN Outcome: Progressing   Problem: Fluid Volume: Goal: Fluid volume balance will be maintained or improved Outcome: Adequate for Discharge   Problem: Respiratory: Goal: Respiratory symptoms related to disease process will be avoided Outcome: Adequate for Discharge   Problem: Urinary Elimination: Goal:  Progression of disease will be identified and treated Outcome: Adequate for Discharge

## 2020-10-03 NOTE — Discharge Summary (Signed)
Physician Discharge Summary  [name] ZTI:[id number] DOB: 07/19/1941 DOA: 09/29/2020  PCP: [name], DO  Admit date: 09/29/2020 Discharge date: 10/03/2020  Discharge disposition: Home with home health therapy   Recommendations for Outpatient Follow-Up:   Follow-up with PCP in 1 week Follow-up with your nephrologist on 10/08/2020 as scheduled previously.   Discharge Diagnosis:   Principal Problem:   AKI (acute kidney injury) ([place]) Active Problems:   PVD (peripheral vascular disease) ([place])   Syncope   CKD (chronic kidney disease) stage 4, GFR 15-29 ml/min (HCC)   Hyperkalemia    Discharge Condition: Stable.  Diet recommendation:  Diet Order            Diet - low sodium heart healthy           Diet Heart Room service appropriate? Yes; Fluid consistency: Thin  Diet effective now                   Code Status: Full Code     Hospital Course:   Ms. Alyssa [name] is a 79 y.o. female known CKD4, HTN, PAD, HTN, anemia of chronic disease--supposed to be on chronic bicarb, dementia, peripheral vascular disease with femorofemoral bypass 2013, chronic rib fracture ,prior Covid infection admitted 8/16 through 8/21 for treatment, prior admission for syncope secondary to dehydration and anorexia.  She was brought to the hospital because of lightheadedness and syncope.  Reportedly, she was going to the bathroom when she felt lightheaded and passed out.  Fortunately, her husband was able to catch her so she did not fall.  No history of seizures.  She was found to have acute kidney injury on CKD stage IV, complicated by hyperkalemia and non anion gap metabolic acidosis.  Syncope was thought to be due to volume depletion and AKI.  She was treated with IV fluids.  Creatinine has improved to baseline and hyperkalemia was successfully treated.  She also had hyponatremia but she was asymptomatic from this.  She was evaluated by PT and OT who recommended home  health therapy.  Her condition has improved and she is deemed stable for discharge to home today.   Medical Consultants:    Nephrologist   Discharge Exam:    Vitals:   10/03/20 0503 10/03/20 0944 10/03/20 1344 10/03/20 1355  BP: (!) 161/89 (!) 156/96 (!) 193/103 (!) 133/95  Pulse: 64 66  84  Resp: 18 17  18   Temp: 98.2 F (36.8 C) 98.1 F (36.7 C)  98.2 F (36.8 C)  TempSrc: Oral   Oral  SpO2: 93% 97%    Weight:      Height:         GEN: NAD SKIN: Warm and dry EYES: No pallor or icterus ENT: MMM CV: RRR PULM: CTA B ABD: soft, ND, NT, +BS CNS: AAO x 3, non focal EXT: No edema or tenderness   The results of significant diagnostics from this hospitalization (including imaging, microbiology, ancillary and laboratory) are listed below for reference.     Procedures and Diagnostic Studies:   CT Head Wo Contrast  Result Date: 09/29/2020 CLINICAL DATA:  Syncope, headache, head and neck trauma EXAM: CT HEAD WITHOUT CONTRAST CT CERVICAL SPINE WITHOUT CONTRAST TECHNIQUE: Multidetector CT imaging of the head and cervical spine was performed following the standard protocol without intravenous contrast. Multiplanar CT image reconstructions of the cervical spine were also generated. COMPARISON:  01/02/2020, 07/09/2020 FINDINGS: CT HEAD FINDINGS Brain: No evidence of acute infarction, hemorrhage, hydrocephalus, extra-axial  collection or mass lesion/mass effect. Moderate low-density changes within the periventricular and subcortical white matter compatible with chronic microvascular ischemic change. Mild diffuse cerebral volume loss. Vascular: Atherosclerotic calcifications involving the large vessels of the skull base. No unexpected hyperdense vessel. Skull: Normal. Negative for fracture or focal lesion. Sinuses/Orbits: Mucosal thickening within the visualized right maxillary sinus and right sphenoid sinus. Scattered partial opacification within several mastoid air cells bilaterally.  Orbital structures within normal limits. Other: Negative for scalp hematoma. CT CERVICAL SPINE FINDINGS Alignment: Unchanged grade 1 anterolisthesis C4 on C5, C5 on C6, and C7 on T1. No facet joint dislocation. Dens and lateral masses remain aligned. Skull base and vertebrae: No acute fracture. No primary bone lesion or focal pathologic process. Soft tissues and spinal canal: No prevertebral fluid or swelling. No visible canal hematoma. Disc levels: Advanced multilevel degenerative changes within the cervical spine and visualized upper thoracic spine, not appreciably changed compared to the prior study. Upper chest: Centrilobular emphysema within the visualized lung apices. Other: Enlarged multinodular thyroid gland. Aortic and bilateral carotid atherosclerosis. IMPRESSION: 1. No acute intracranial findings. 2. Chronic microvascular ischemic changes and cerebral volume loss. 3. No evidence of acute fracture or traumatic subluxation of the cervical spine. 4. Advanced multilevel degenerative changes of the cervical spine, not appreciably changed compared to the prior study. 5. Right maxillary and sphenoid sinus disease. 6. Partial opacification within several mastoid air cells bilaterally. Correlate for signs and symptoms of mastoiditis. 7. Enlarged multinodular thyroid gland. Recommend thyroid ultrasound on a nonemergent basis (ref: J Am Coll Radiol. 2015 Feb;12(2): 143-50). Electronically Signed   By: Davina Poke D.O.   On: 09/29/2020 15:31   CT Cervical Spine Wo Contrast  Result Date: 09/29/2020 CLINICAL DATA:  Syncope, headache, head and neck trauma EXAM: CT HEAD WITHOUT CONTRAST CT CERVICAL SPINE WITHOUT CONTRAST TECHNIQUE: Multidetector CT imaging of the head and cervical spine was performed following the standard protocol without intravenous contrast. Multiplanar CT image reconstructions of the cervical spine were also generated. COMPARISON:  01/02/2020, 07/09/2020 FINDINGS: CT HEAD FINDINGS Brain: No  evidence of acute infarction, hemorrhage, hydrocephalus, extra-axial collection or mass lesion/mass effect. Moderate low-density changes within the periventricular and subcortical white matter compatible with chronic microvascular ischemic change. Mild diffuse cerebral volume loss. Vascular: Atherosclerotic calcifications involving the large vessels of the skull base. No unexpected hyperdense vessel. Skull: Normal. Negative for fracture or focal lesion. Sinuses/Orbits: Mucosal thickening within the visualized right maxillary sinus and right sphenoid sinus. Scattered partial opacification within several mastoid air cells bilaterally. Orbital structures within normal limits. Other: Negative for scalp hematoma. CT CERVICAL SPINE FINDINGS Alignment: Unchanged grade 1 anterolisthesis C4 on C5, C5 on C6, and C7 on T1. No facet joint dislocation. Dens and lateral masses remain aligned. Skull base and vertebrae: No acute fracture. No primary bone lesion or focal pathologic process. Soft tissues and spinal canal: No prevertebral fluid or swelling. No visible canal hematoma. Disc levels: Advanced multilevel degenerative changes within the cervical spine and visualized upper thoracic spine, not appreciably changed compared to the prior study. Upper chest: Centrilobular emphysema within the visualized lung apices. Other: Enlarged multinodular thyroid gland. Aortic and bilateral carotid atherosclerosis. IMPRESSION: 1. No acute intracranial findings. 2. Chronic microvascular ischemic changes and cerebral volume loss. 3. No evidence of acute fracture or traumatic subluxation of the cervical spine. 4. Advanced multilevel degenerative changes of the cervical spine, not appreciably changed compared to the prior study. 5. Right maxillary and sphenoid sinus disease. 6. Partial opacification within several  mastoid air cells bilaterally. Correlate for signs and symptoms of mastoiditis. 7. Enlarged multinodular thyroid gland. Recommend  thyroid ultrasound on a nonemergent basis (ref: J Am Coll Radiol. 2015 Feb;12(2): 143-50). Electronically Signed   By: Davina Poke D.O.   On: 09/29/2020 15:31   US Renal  Result Date: 09/29/2020 CLINICAL DATA:  79 year old female with acute renal insufficiency. EXAM: RENAL / URINARY TRACT ULTRASOUND COMPLETE COMPARISON:  Renal ultrasound dated 09/17/2019. FINDINGS: Right Kidney: Renal measurements: 7.1 x 3.3 x 3.2 cm = volume: 39 mL. The right kidney is atrophic and echogenic in keeping with chronic kidney disease. No hydronephrosis or shadowing stone. There is a subcentimeter upper pole cyst. Left Kidney: Renal measurements: 9.3 x 5.7 x 4.3 cm = volume: 120 mL. The left kidney is echogenic. No hydronephrosis or shadowing stone. There is a 1.4 cm interpolar cyst. Bladder: Appears normal for degree of bladder distention. Other: None. IMPRESSION: 1. Echogenic kidneys in keeping with chronic kidney disease. No hydronephrosis or shadowing stone. 2. Stable right renal atrophy. 3. Bilateral renal cysts. Electronically Signed   By: Anner Crete M.D.   On: 09/29/2020 19:07     Labs:   Basic Metabolic Panel: Recent Labs  Lab 09/29/20 1406 09/29/20 2005 09/29/20 2040 09/30/20 1256 10/01/20 1050 10/02/20 0402 10/03/20 0124  NA 136 135 137 133* 131* 133* 131*  K 6.0* 6.7* 6.6* 5.4* 5.1 5.0 4.6  CL 110 111  --  108 104 104 100  CO2 13* 14*  --  17* 17* 20* 19*  GLUCOSE 128* 92  --  100* 96 127* 148*  BUN 53* 51*  --  41* 40* 37* 36*  CREATININE 2.81* 2.55*  --  2.30* 2.16* 2.44* 2.22*  CALCIUM 9.1 9.3  --  9.0 8.8* 8.8* 8.4*  MG 2.5*  --   --   --   --   --   --   PHOS  --  4.5  --   --   --  4.7* 4.8*   GFR Estimated Creatinine Clearance: 15.2 mL/min (A) (by C-G formula based on SCr of 2.22 mg/dL (H)). Liver Function Tests: Recent Labs  Lab 09/29/20 1406 09/29/20 2005 09/30/20 1256 10/02/20 0402 10/03/20 0124  AST 17  --  15  --   --   ALT 14  --  11  --   --   ALKPHOS 77  --   79  --   --   BILITOT 0.4  --  0.6  --   --   PROT 6.7  --  6.2*  --   --   ALBUMIN 3.4* 3.4* 3.1* 2.8* 2.7*   No results for input(s): LIPASE, AMYLASE in the last 168 hours. No results for input(s): AMMONIA in the last 168 hours. Coagulation profile No results for input(s): INR, PROTIME in the last 168 hours.  CBC: Recent Labs  Lab 09/29/20 1406 09/29/20 2005 09/29/20 2040 09/30/20 0247 10/03/20 0124  WBC 8.8 10.0  --  7.9 6.9  NEUTROABS 5.7  --   --   --  3.4  HGB 9.7* 9.9* 10.2* 8.9* 8.3*  HCT 32.6* 31.7* 30.0* 28.3* 24.9*  MCV 89.6 85.9  --  85.8 80.3  PLT 438* 450*  --  368 330   Cardiac Enzymes: No results for input(s): CKTOTAL, CKMB, CKMBINDEX, TROPONINI in the last 168 hours. BNP: Invalid input(s): POCBNP CBG: No results for input(s): GLUCAP in the last 168 hours. D-Dimer No results for input(s): DDIMER in the  last 72 hours. Hgb A1c No results for input(s): HGBA1C in the last 72 hours. Lipid Profile No results for input(s): CHOL, HDL, LDLCALC, TRIG, CHOLHDL, LDLDIRECT in the last 72 hours. Thyroid function studies No results for input(s): TSH, T4TOTAL, T3FREE, THYROIDAB in the last 72 hours.  Invalid input(s): FREET3 Anemia work up Recent Labs    10/02/20 1143  FERRITIN 790*  TIBC 300  IRON 96   Microbiology Recent Results (from the past 240 hour(s))  Culture, Urine     Status: Abnormal   Collection Time: 09/29/20 11:53 PM   Specimen: Urine, Random  Result Value Ref Range Status   Specimen Description URINE, RANDOM  Final   Special Requests   Final    NONE Performed at [facility], [address], [place], [place] [address]    Culture 20,000 COLONIES/mL ENTEROBACTER AEROGENES (A)  Final   Report Status 10/01/2020 FINAL  Final   Organism ID, Bacteria ENTEROBACTER AEROGENES (A)  Final      Susceptibility   Enterobacter aerogenes - MIC*    CEFAZOLIN >=64 RESISTANT Resistant     CEFEPIME <=0.12 SENSITIVE Sensitive     CEFTRIAXONE <=0.25  SENSITIVE Sensitive     CIPROFLOXACIN <=0.25 SENSITIVE Sensitive     GENTAMICIN <=1 SENSITIVE Sensitive     IMIPENEM 1 SENSITIVE Sensitive     NITROFURANTOIN 128 RESISTANT Resistant     TRIMETH/SULFA <=20 SENSITIVE Sensitive     PIP/TAZO <=4 SENSITIVE Sensitive     * 20,000 COLONIES/mL ENTEROBACTER AEROGENES  Resp Panel by RT-PCR (Flu A&B, Covid) Nasopharyngeal Swab     Status: None   Collection Time: 09/30/20  2:00 PM   Specimen: Nasopharyngeal Swab; Nasopharyngeal(NP) swabs in vial transport medium  Result Value Ref Range Status   SARS Coronavirus 2 by RT PCR NEGATIVE NEGATIVE Final    Comment: (NOTE) SARS-CoV-2 target nucleic acids are NOT DETECTED.  The SARS-CoV-2 RNA is generally detectable in upper respiratory specimens during the acute phase of infection. The lowest concentration of SARS-CoV-2 viral copies this assay can detect is 138 copies/mL. A negative result does not preclude SARS-Cov-2 infection and should not be used as the sole basis for treatment or other patient management decisions. A negative result may occur with  improper specimen collection/handling, submission of specimen other than nasopharyngeal swab, presence of viral mutation(s) within the areas targeted by this assay, and inadequate number of viral copies(<138 copies/mL). A negative result must be combined with clinical observations, patient history, and epidemiological information. The expected result is Negative.  Fact Sheet for Patients:  EntrepreneurPulse.com.au  Fact Sheet for Healthcare Providers:  IncredibleEmployment.be  This test is no t yet approved or cleared by the [place] FDA and  has been authorized for detection and/or diagnosis of SARS-CoV-2 by FDA under an Emergency Use Authorization (EUA). This EUA will remain  in effect (meaning this test can be used) for the duration of the COVID-19 declaration under Section 564(b)(1) of the Act,  21 U.S.C.section 360bbb-3(b)(1), unless the authorization is terminated  or revoked sooner.       Influenza A by PCR NEGATIVE NEGATIVE Final   Influenza B by PCR NEGATIVE NEGATIVE Final    Comment: (NOTE) The Xpert Xpress SARS-CoV-2/FLU/RSV plus assay is intended as an aid in the diagnosis of influenza from Nasopharyngeal swab specimens and should not be used as a sole basis for treatment. Nasal washings and aspirates are unacceptable for Xpert Xpress SARS-CoV-2/FLU/RSV testing.  Fact Sheet for Patients: EntrepreneurPulse.com.au  Fact  Sheet for Healthcare Providers: IncredibleEmployment.be  This test is not yet approved or cleared by the [place] and has been authorized for detection and/or diagnosis of SARS-CoV-2 by FDA under an Emergency Use Authorization (EUA). This EUA will remain in effect (meaning this test can be used) for the duration of the COVID-19 declaration under Section 564(b)(1) of the Act, 21 U.S.C. section 360bbb-3(b)(1), unless the authorization is terminated or revoked.  Performed at [facility], [address], [place], [place] [address]      Discharge Instructions:   Discharge Instructions    Diet - low sodium heart healthy   Complete by: As directed    Discharge instructions   Complete by: As directed    Follow up with your nephrologist as scheduled on 10/08/2020 as scheduled.   Face-to-face encounter (required for Medicare/Medicaid patients)   Complete by: As directed    I Uel Davidow certify that this patient is under my care and that I, or a nurse practitioner or physician's assistant working with me, had a face-to-face encounter that meets the physician face-to-face encounter requirements with this patient on 10/03/2020. The encounter with the patient was in whole, or in part for the following medical condition(s) which is the primary reason for home health care (List medical condition):  Debility   The encounter with the patient was in whole, or in part, for the following medical condition, which is the primary reason for home health care: Debility   I certify that, based on my findings, the following services are medically necessary home health services: Physical therapy   Reason for Medically Necessary Home Health Services: Therapy- Personnel officer, Public librarian   My clinical findings support the need for the above services: Unable to leave home safely without assistance and/or assistive device   Further, I certify that my clinical findings support that this patient is homebound due to: Unable to leave home safely without assistance   Face-to-face encounter (required for Medicare/Medicaid patients)   Complete by: As directed    I Synia Douglass certify that this patient is under my care and that I, or a nurse practitioner or physician's assistant working with me, had a face-to-face encounter that meets the physician face-to-face encounter requirements with this patient on 10/03/2020. The encounter with the patient was in whole, or in part for the following medical condition(s) which is the primary reason for home health care (List medical condition): Debility   The encounter with the patient was in whole, or in part, for the following medical condition, which is the primary reason for home health care: Debility   I certify that, based on my findings, the following services are medically necessary home health services: Physical therapy   Reason for Medically Necessary Home Health Services: Therapy- Personnel officer, Public librarian   My clinical findings support the need for the above services: Unable to leave home safely without assistance and/or assistive device   Further, I certify that my clinical findings support that this patient is homebound due to: Unable to leave home safely without assistance   Home Health   Complete by: As directed     To provide the following care/treatments: PT   Home Health   Complete by: As directed    To provide the following care/treatments: PT   Increase activity slowly   Complete by: As directed      Allergies as of 10/03/2020   No Known Allergies  Medication List    STOP taking these medications   bisoprolol 10 MG tablet Commonly known as: ZEBETA     TAKE these medications   acetaminophen 325 MG tablet Commonly known as: TYLENOL Take 2 tablets (650 mg total) by mouth every 6 (six) hours as needed for mild pain, moderate pain or fever (or Fever >/= 101).   albuterol 108 (90 Base) MCG/ACT inhaler Commonly known as: VENTOLIN HFA Inhale 2 puffs into the lungs every 4 (four) hours as needed for wheezing or shortness of breath.   amLODipine 2.5 MG tablet Commonly known as: NORVASC Take 2.5 mg by mouth daily.   aspirin 81 MG EC tablet Take 1 tablet (81 mg total) by mouth daily.   Breztri Aerosphere 160-9-4.8 MCG/ACT Aero Generic drug: Budeson-Glycopyrrol-Formoterol Inhale 2 puffs into the lungs 2 (two) times daily. What changed: Another medication with the same name was removed. Continue taking this medication, and follow the directions you see here.   carvedilol 12.5 MG tablet Commonly known as: COREG Take 12.5 mg by mouth 2 (two) times daily.   cloNIDine 0.2 MG tablet Commonly known as: CATAPRES Take 0.2 mg by mouth at bedtime.   clopidogrel 75 MG tablet Commonly known as: PLAVIX Take 1 tablet (75 mg total) by mouth daily.   diclofenac Sodium 1 % Gel Commonly known as: VOLTAREN Apply 2 g topically 4 (four) times daily as needed (pain).   dicyclomine 20 MG tablet Commonly known as: BENTYL Take 20 mg by mouth 3 (three) times daily.   donepezil 5 MG tablet Commonly known as: ARICEPT Take 5 mg by mouth daily.   DULoxetine 30 MG capsule Commonly known as: CYMBALTA Take 30 mg by mouth daily.   feeding supplement Liqd Take 237 mLs by mouth 2 (two) times daily  between meals.   gabapentin 100 MG capsule Commonly known as: NEURONTIN Take 100 mg by mouth 3 (three) times daily.   hydrOXYzine 25 MG tablet Commonly known as: ATARAX/VISTARIL Take 25 mg by mouth at bedtime as needed (sleep).   Iron High-Potency 325 MG Tabs Take 325 mg by mouth daily with breakfast.   magnesium oxide 400 MG tablet Commonly known as: MAG-OX Take 400 mg by mouth daily.   megestrol 40 MG tablet Commonly known as: MEGACE Take 40 mg by mouth 2 (two) times daily.   methocarbamol 500 MG tablet Commonly known as: ROBAXIN Take 500 mg by mouth 3 (three) times daily as needed (back spasms).   multivitamin with minerals Tabs tablet Take 1 tablet by mouth daily.   nitroGLYCERIN 0.4 MG SL tablet Commonly known as: NITROSTAT Place 1 tablet (0.4 mg total) under the tongue every 5 (five) minutes as needed for chest pain.   ondansetron 4 MG disintegrating tablet Commonly known as: Zofran ODT Take 1 tablet (4 mg total) by mouth every 4 (four) hours as needed for nausea or vomiting.   pantoprazole 40 MG tablet Commonly known as: PROTONIX Take 1 tablet (40 mg total) by mouth daily.   rosuvastatin 40 MG tablet Commonly known as: CRESTOR Take 1 tablet (40 mg total) by mouth daily.   sodium bicarbonate 650 MG tablet Take 2 tablets (1,300 mg total) by mouth 2 (two) times daily.   valproic acid 250 MG capsule Commonly known as: DEPAKENE Take 250 mg by mouth at bedtime.   Vitamin D (Ergocalciferol) 1.25 MG (50000 UNIT) Caps capsule Commonly known as: DRISDOL Take 50,000 Units by mouth every 7 (seven) days. mondays  Follow-up Information    [facility] Follow up.   Specialty: [place] Why: the office will call to schedule physical therapy visits Contact information: [place] [address] [phone]                Time coordinating discharge: 32 minutes  Signed:  Jennye Boroughs  [facility] 10/03/2020, 2:46 PM   Pager on [email or web address]. If 7PM-7AM, please contact night-coverage at [email or web address]

## 2020-10-03 NOTE — Progress Notes (Signed)
Patient's BP taken by PT 193/103  And reported to RN. MD notified and BP rechecked was 133/95 . MD with ok to discharge.

## 2020-10-03 NOTE — Progress Notes (Addendum)
Occupational Therapy Treatment Patient Details Name: Alyssa [name] MRN: [id number] DOB: 11/10/1940 Today's Date: 10/03/2020    History of present illness 79yo female presenting to the ED after a syncopal episode at home. Admit with hx of syncope, hyperkalemia, and AKI on CKD stage IV. PMH respiratory failure, CKD, HLD, HTN, peripheral neuropathy, PVD, femoral to femoral bypass, hx THR   OT comments  Pt progressing towards established OT goals. Pt performing UB and LB dressing at [place] level. Pt agreeable to mobility in hallway to assessing balance in preparation for dc. BP sitting at EOB 178/96 (115) @ 1335. Pt performing functional mobility in hallway with Min Guard A and RW. BP after mobility 193/103 (128) @ 1344. Notified RN. Continue to recommend dc to home with HHOT and will continue to follow acutely as admitted.    Follow Up Recommendations  Home health OT;Supervision/Assistance - 24 hour    Equipment Recommendations  3 in 1 bedside commode    Recommendations for Other Services PT consult    Precautions / Restrictions Precautions Precautions: Fall;Other (comment) Precaution Comments: watch BP Restrictions Weight Bearing Restrictions: No       Mobility Bed Mobility               General bed mobility comments: Sitting at EOB upon arrival  Transfers Overall transfer level: Needs assistance Equipment used: Rolling walker (2 wheeled) Transfers: Sit to/from [employer] Transfers Sit to Stand: Min guard         General transfer comment: Min Guard A for safety    Balance Overall balance assessment: Needs assistance Sitting-balance support: Feet supported;Bilateral upper extremity supported Sitting balance-Leahy Scale: Good     Standing balance support: Single extremity supported;During functional activity;Bilateral upper extremity supported Standing balance-Leahy Scale: Poor Standing balance comment: It standing, pt either  reliant on UE support or legs again EOB                           ADL either performed or assessed with clinical judgement   ADL Overall ADL's : Needs assistance/impaired                 Upper Body Dressing : Set up;Supervision/safety;Sitting   Lower Body Dressing: Min guard;Sit to/from stand Lower Body Dressing Details (indicate cue type and reason): use of figure four method Toilet Transfer: Min guard;Ambulation;RW (simulated to recliner)           Functional mobility during ADLs: Min guard;Rolling walker General ADL Comments: Pt performing dressing and mobility in hallway with Min Guard A for safety. Taking BP begining and end of session; both elevated. Notified RN     Vision       Perception     Praxis      Cognition Arousal/Alertness: Awake/alert Behavior During Therapy: WFL for tasks assessed/performed;Impulsive Overall Cognitive Status: Within Functional Limits for tasks assessed                                 General Comments: Slightly impulsive. Feel pt is at baseline cognition        Exercises     Shoulder Instructions       General Comments BP sitting upon start of session 178/96 (115). BP at end of session after mobility 193/103 (128)    Pertinent Vitals/ Pain       Pain Assessment: No/denies pain  Home Living  Prior Functioning/Environment              Frequency  Min 2X/week        Progress Toward Goals  OT Goals(current goals can now be found in the care plan section)  Progress towards OT goals: Progressing toward goals  Acute Rehab OT Goals Patient Stated Goal: go home OT Goal Formulation: With patient Time For Goal Achievement: 10/16/20 Potential to Achieve Goals: Good ADL Goals Pt Will Perform Grooming: with modified independence;sitting;standing Pt Will Perform Upper Body Dressing: with modified independence;sitting Pt Will Perform  Lower Body Dressing: with min guard assist;sit to/from stand Pt Will Transfer to Toilet: with min guard assist;bedside commode;ambulating Pt Will Perform Toileting - Clothing Manipulation and hygiene: with min guard assist;sitting/lateral leans;sit to/from stand  Plan Discharge plan remains appropriate    Co-evaluation                 AM-PAC OT "6 Clicks" Daily Activity     Outcome Measure   Help from another person eating meals?: None Help from another person taking care of personal grooming?: A Little Help from another person toileting, which includes using toliet, bedpan, or urinal?: A Little Help from another person bathing (including washing, rinsing, drying)?: A Little Help from another person to put on and taking off regular upper body clothing?: A Little Help from another person to put on and taking off regular lower body clothing?: A Little 6 Click Score: 19    End of Session    OT Visit Diagnosis: Unsteadiness on feet (R26.81);Other abnormalities of gait and mobility (R26.89);Muscle weakness (generalized) (M62.81)   Activity Tolerance Other (comment) (Limited by BP)   Patient Left with call bell/phone within reach;in bed;with bed alarm set   Nurse Communication Mobility status        Time: 2423-5361 OT Time Calculation (min): 17 min  Charges: OT General Charges $OT Visit: 1 Visit OT Treatments $Self Care/Home Management : 8-22 mins  [place], OTR/L Acute Rehab Pager: [phone] Office: [place] 10/03/2020, 1:54 PM

## 2020-10-03 NOTE — Telephone Encounter (Signed)
lmtcb for [employer].

## 2020-10-03 NOTE — TOC Transition Note (Signed)
Transition of Care [facility]) - CM/SW Discharge Note   Patient Details  Name: JOSCELYN HARDRICK MRN: [id number] Date of Birth: [birth year]  [facility]) CM/SW Contact:  Bartholomew Crews, RN Phone Number: [phone] 10/03/2020, 2:29 PM   Clinical Narrative:     Patient to transition home today. HH PT order written by MD. Catalina Antigua at [place]. No further TOC needs identified at this time.  Final next level of care: Home w Home Health Services Barriers to Discharge: No Barriers Identified   Patient Goals and CMS Choice Patient states their goals for this hospitalization and ongoing recovery are:: home with husband CMS Medicare.gov Compare Post Acute Care list provided to:: Patient Choice offered to / list presented to : Patient  Discharge Placement                       Discharge Plan and Services In-house Referral: NA Discharge Planning Services: CM Consult Post Acute Care Choice: Home Health          DME Arranged: N/A DME Agency: NA       HH Arranged: PT HH Agency: [place] Date [place]: 10/03/20 Time [place]: 4469 Representative spoke with at [place]: [place] ([place]) Interventions     Readmission Risk Interventions Readmission Risk Prevention Plan 07/10/2020 12/26/2018  Transportation Screening Complete Complete  Medication Review Press photographer) Complete Complete  PCP or Specialist appointment within 3-5 days of discharge - Not Complete  PCP/Specialist Appt Not Complete comments - pt for snf. Snf MD to see as needed.  [place] or Home Care Consult Complete Not Complete  HRI or Home Care Consult Pt Refusal Comments - NA  SW Recovery Care/Counseling Consult Complete Not Complete  SW Consult Not Complete Comments - NA  Palliative Care Screening Not Applicable Complete  Skilled Nursing Facility Not Applicable Complete  Some recent data might be hidden

## 2020-10-03 NOTE — Progress Notes (Signed)
[facility] Progress Note   Assessment/ Plan:   1. Acute kidney Injury on chronic kidney disease stage IV (baseline creatinine suspected 1.8-2.2): Came in with what appears to be hemodynamically mediated acute kidney injury and multiple metabolic abnormalities that have improved with intravenous fluids and medical management.  Renal function now back to baseline.  She is stable enough from a renal standpoint to discharge home today; she informs me that she has a previously scheduled appointment with Dr. Joelyn Oms at [facility] on 12/22. 2.  Hyperkalemia: Secondary to acute kidney injury in this patient with history of renal artery stenosis.  This has been corrected with medical management/improvement of renal function. 3.  Anion gap metabolic acidosis: Secondary to acute kidney injury, status post isotonic sodium bicarbonate and appears to be improving -recommend discharging home on oral sodium bicarbonate 650 mg 3 times daily. 4.  Hypertension: Blood pressures remain intermittently elevated, continue carvedilol and adjusted dose of amlodipine to 10 mg daily. 5.  Anemia: Without overt loss and downtrending hemoglobin/hematocrit likely from correction of initial hemoconcentration/falsely elevated H&H following intravenous fluids, iron stores appear sufficient.  She will likely need ESA as an outpatient at some point.  Subjective:   Denies any complaints, she does not have any chest pain or shortness of breath.  Ambulated without problems.   Objective:   BP (!) 156/96 (BP Location: Left Arm)   Pulse 66   Temp 98.1 F (36.7 C)   Resp 17   Ht 5' (1.524 m)   Wt 45.7 kg   SpO2 97%   BMI 19.68 kg/m   Intake/Output Summary (Last 24 hours) at 10/03/2020 1110 Last data filed at 10/03/2020 0950 Gross per 24 hour  Intake 3039.15 ml  Output 1200 ml  Net 1839.15 ml   Weight change: -2.8 kg  Physical Exam: Gen: Comfortably resting in recliner CVS: Pulse regular rhythm, normal  rate, S1 and S2 normal Resp: Anteriorly clear to auscultation, no distinct rales or rhonchi Abd: Soft, flat, nontender, bowel sounds normal Ext: No lower extremity edema on exam.  Imaging: No results found.  Labs: BMET Recent Labs  Lab 09/29/20 1406 09/29/20 2005 09/29/20 2040 09/30/20 1256 10/01/20 1050 10/02/20 0402 10/03/20 0124  NA 136 135 137 133* 131* 133* 131*  K 6.0* 6.7* 6.6* 5.4* 5.1 5.0 4.6  CL 110 111  --  108 104 104 100  CO2 13* 14*  --  17* 17* 20* 19*  GLUCOSE 128* 92  --  100* 96 127* 148*  BUN 53* 51*  --  41* 40* 37* 36*  CREATININE 2.81* 2.55*  --  2.30* 2.16* 2.44* 2.22*  CALCIUM 9.1 9.3  --  9.0 8.8* 8.8* 8.4*  PHOS  --  4.5  --   --   --  4.7* 4.8*   CBC Recent Labs  Lab 09/29/20 1406 09/29/20 2005 09/29/20 2040 09/30/20 0247 10/03/20 0124  WBC 8.8 10.0  --  7.9 6.9  NEUTROABS 5.7  --   --   --  3.4  HGB 9.7* 9.9* 10.2* 8.9* 8.3*  HCT 32.6* 31.7* 30.0* 28.3* 24.9*  MCV 89.6 85.9  --  85.8 80.3  PLT 438* 450*  --  368 330    Medications:    . amLODipine  10 mg Oral Daily  . aspirin EC  81 mg Oral Daily  . carvedilol  12.5 mg Oral BID WC  . cloNIDine  0.2 mg Oral QHS  . clopidogrel  75 mg Oral Daily  .  donepezil  5 mg Oral Daily  . DULoxetine  30 mg Oral Daily  . ferrous sulfate  325 mg Oral Q breakfast  . heparin  5,000 Units Subcutaneous Q8H  . sodium bicarbonate  650 mg Oral BID  . valproic acid  250 mg Oral QHS    Elmarie Shiley, MD 10/03/2020, 11:10 AM

## 2020-10-06 NOTE — Telephone Encounter (Signed)
Bisoprolol is no longer on pt's med list. Due to several unsuccessful attempts to reach pt message will be closed. Pt has an appt with Dr. Melvyn Novas in 10/2020.

## 2020-10-08 IMAGING — DX DG FEMUR 2+V*R*
4 series · 4 of 4 positions shown · non-contrast
Comparison: Recent CT evaluation of 06/07/2019

CLINICAL DATA: Right hip pain with radiation down the thigh.

EXAM:
RIGHT FEMUR 2 VIEWS

[femur ap (1 of 2)]
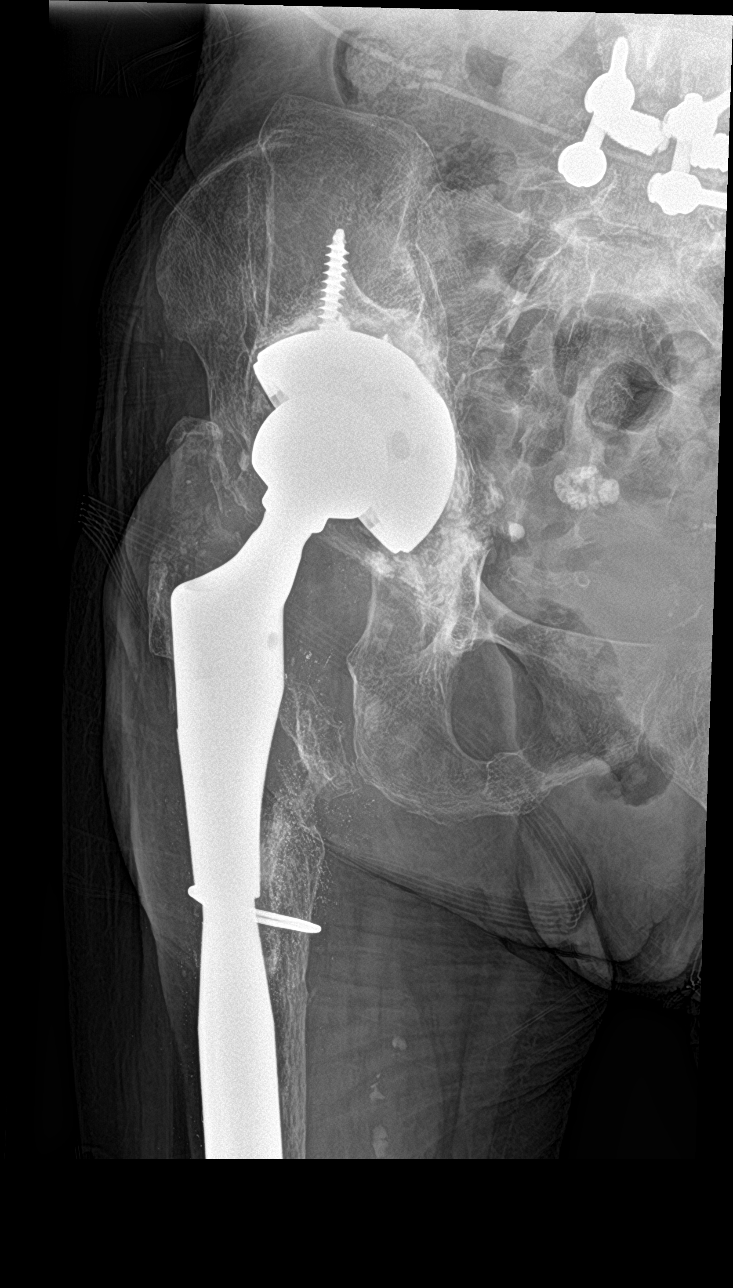

[femur lat (1 of 2)]
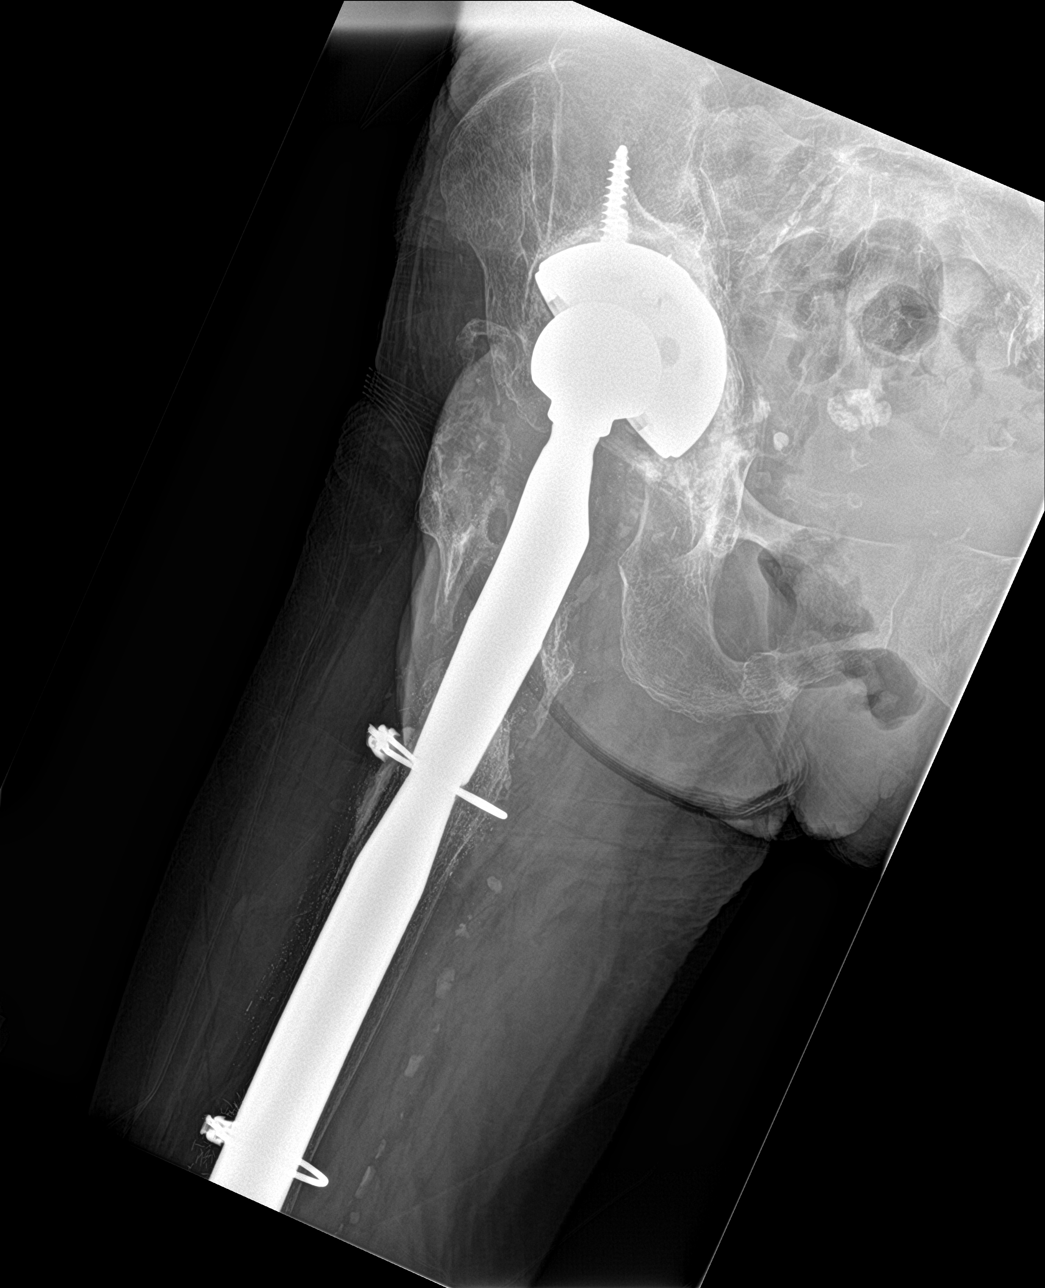

[femur lat (2 of 2)]
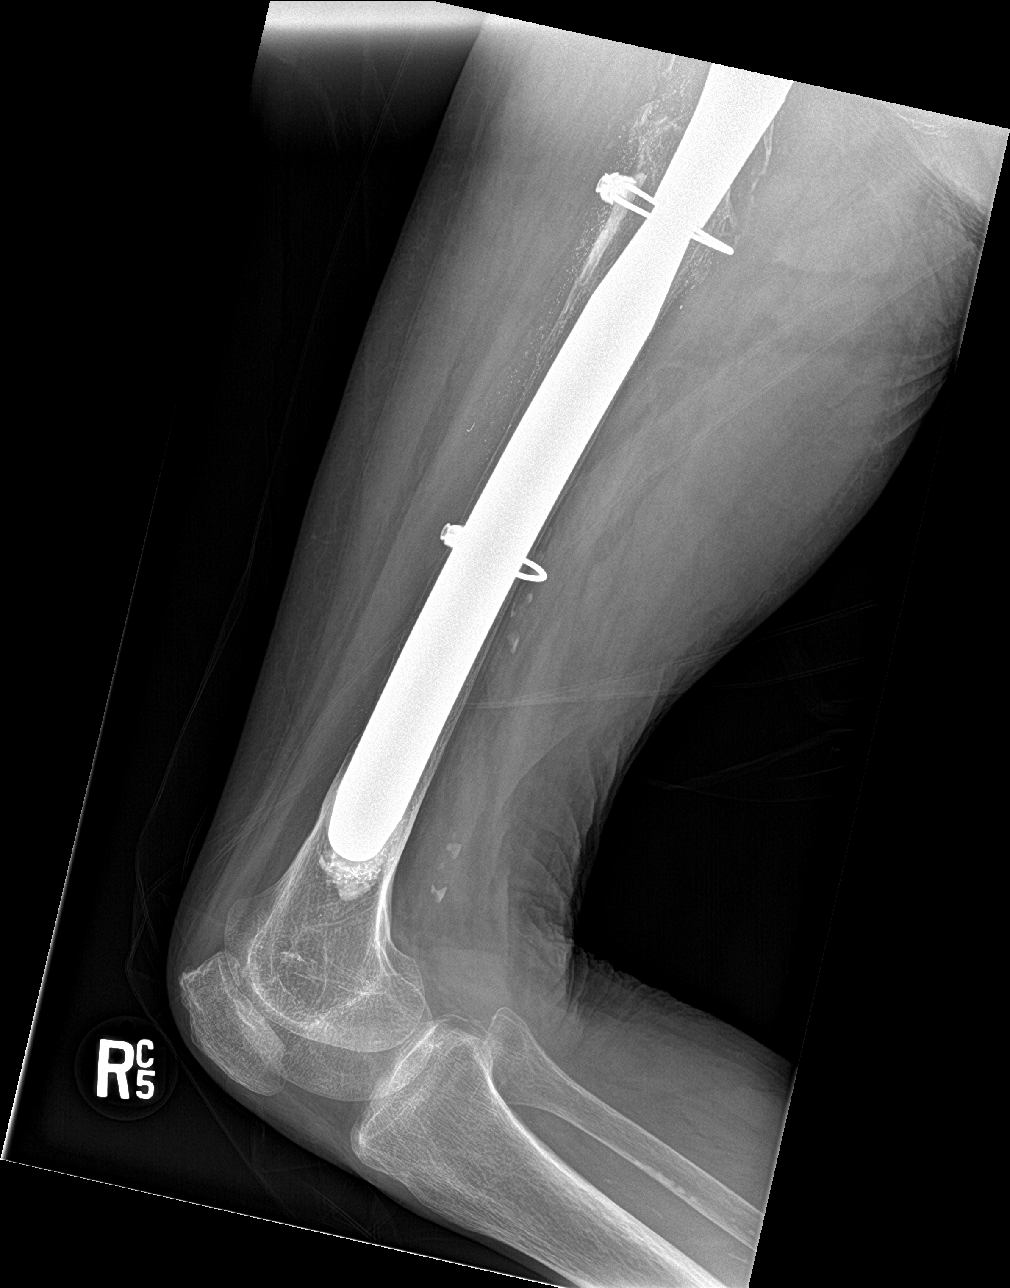

[femur ap (2 of 2)]
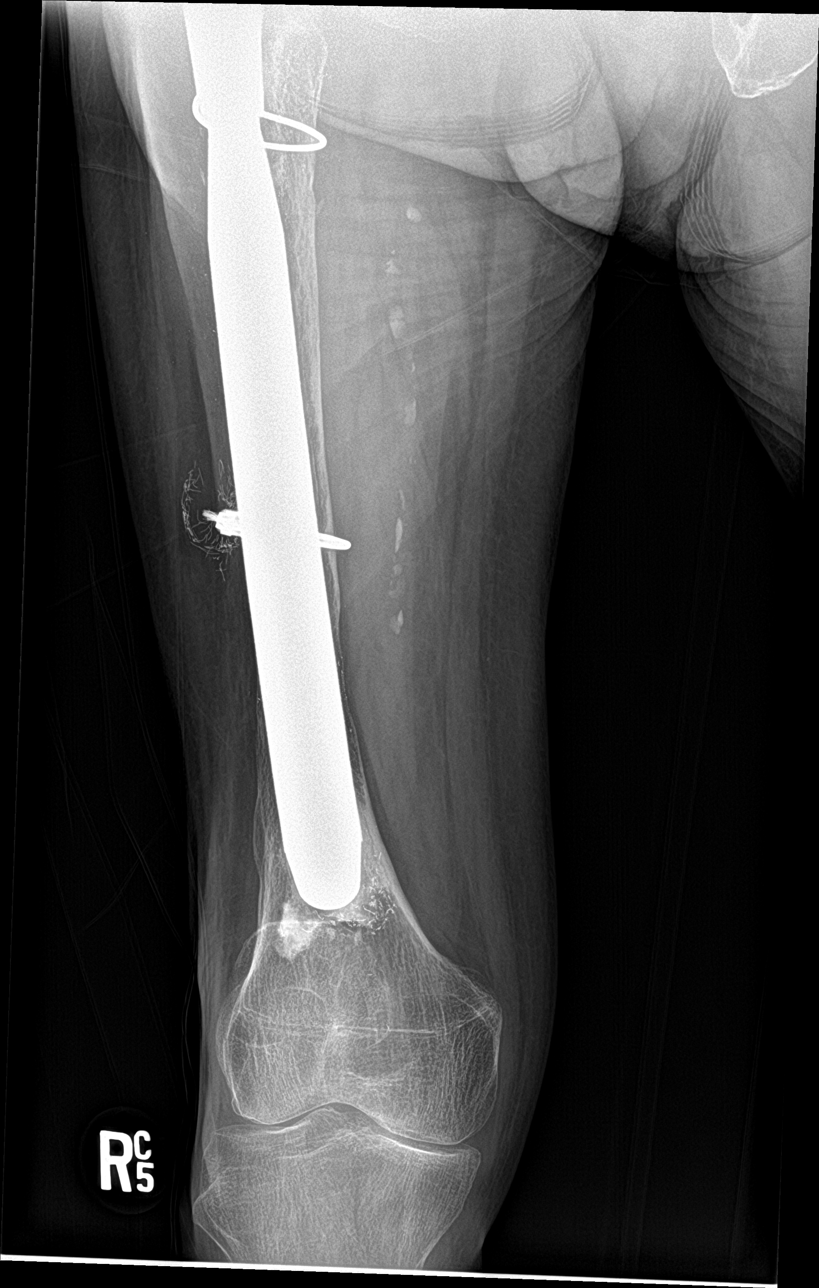

[4 of 4 positions shown; findings below may reference images not displayed]

FINDINGS: Nondisplaced fracture along the inferior margin of the right
acetabular component as described in the pelvic study of the same
date. Similar appearance of the femoral component, cerclage wires
and bony fragments when compared to CT study of 06/07/2019 and more
remote evaluations of the pelvis. No signs of additional fracture
along the femoral shaft.
IMPRESSION: Right acetabular fracture better seen on the pelvic study that was
acquired concurrently.

## 2020-10-08 IMAGING — DX DG PELVIS 1-2V
1 series · 1 of 1 positions shown · non-contrast
Comparison: 02/21/2010 also recent CT evaluation of 06/07/2019

CLINICAL DATA: Right hip pain with radiation down the thigh, no
recent fall. No swelling. It has

EXAM:
PELVIS - 1-2 VIEW

[pelvis ap]
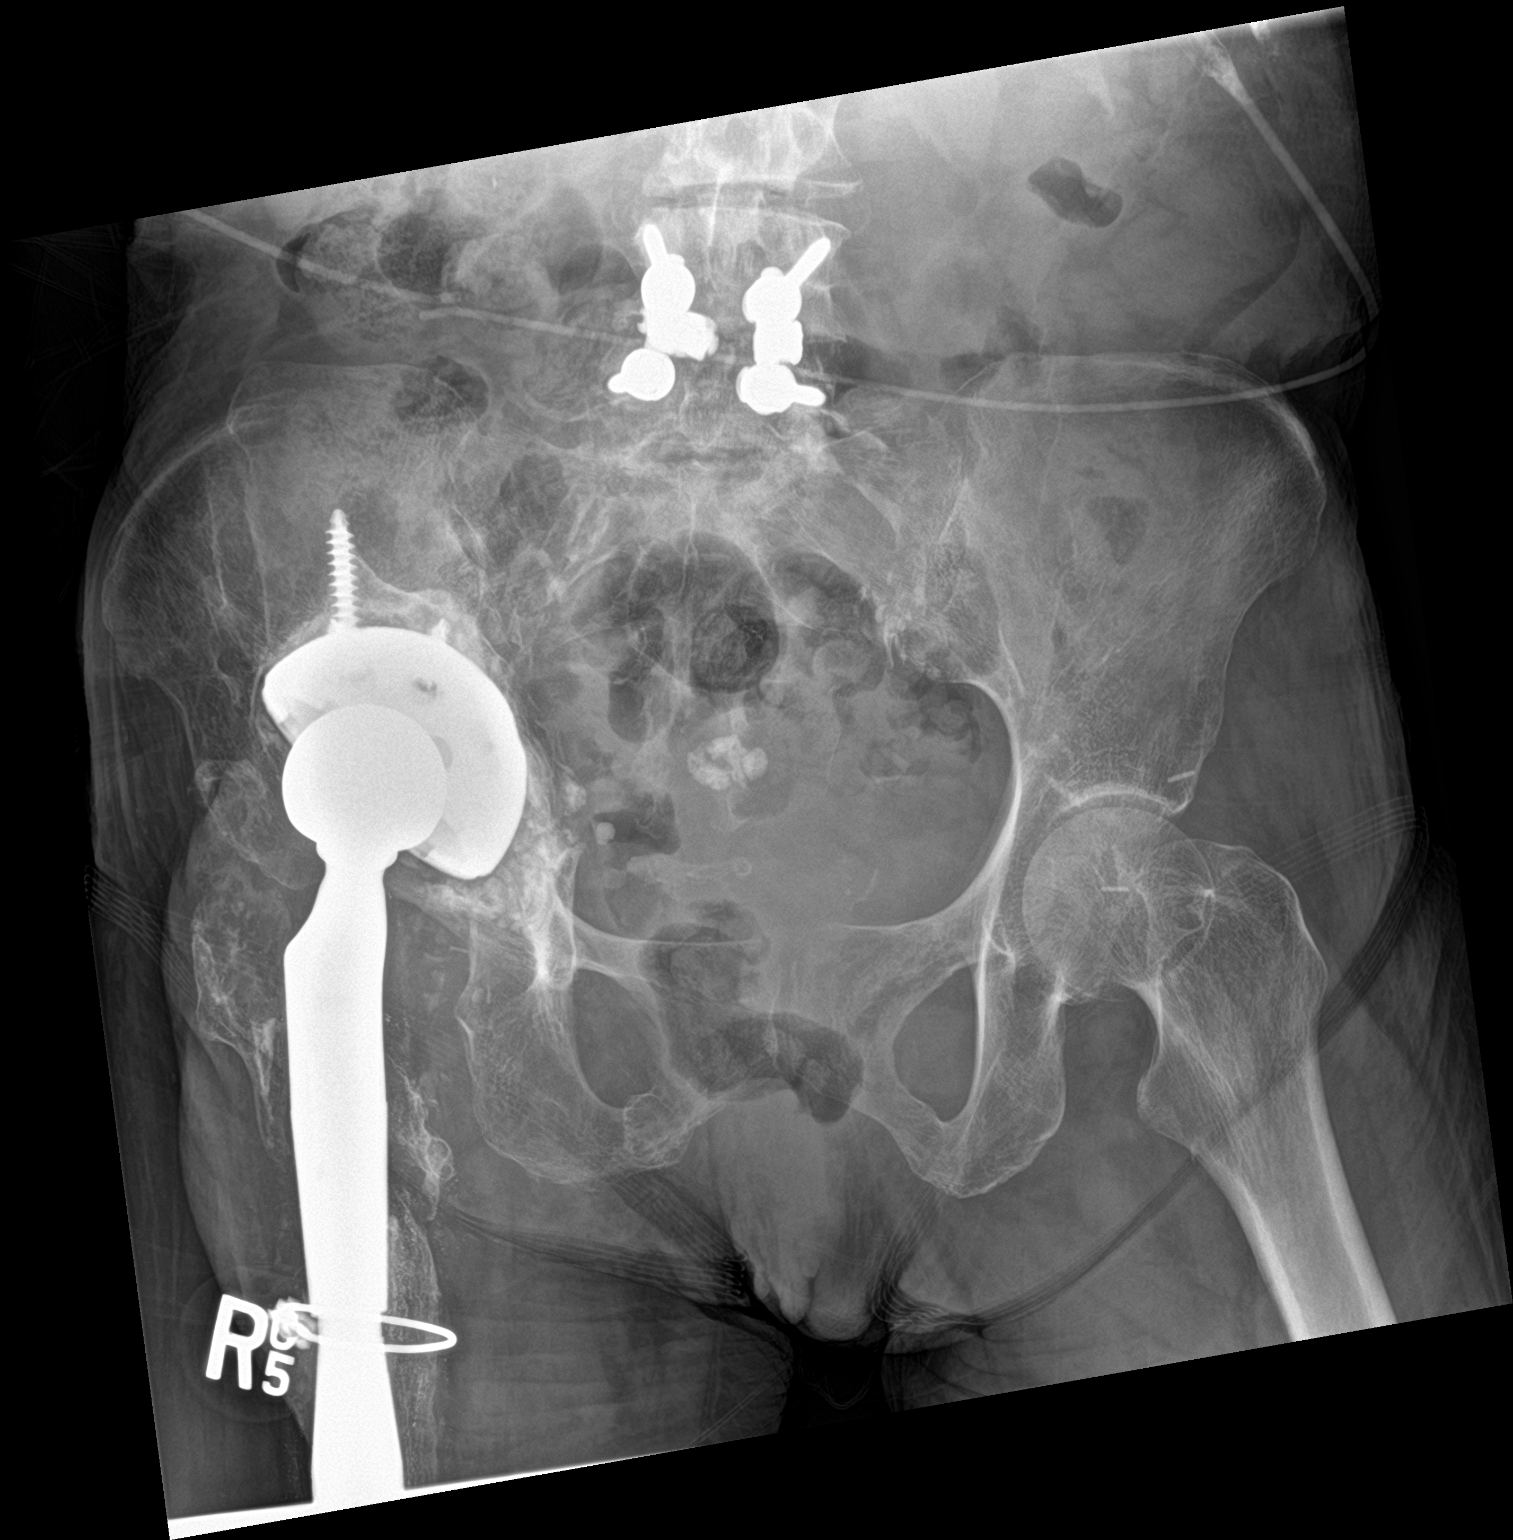

[1 of 1 positions shown; findings below may reference images not displayed]

FINDINGS: New nondisplaced fracture of the right acetabulum along the margin
of the acetabular component extending from the medial wall of the
acetabulum along the inferior margin of the acetabular component of
a right hip arthroplasty. Superior migration of the acetabular
component is similar to the prior study.

Osteopenia with cerclage wires about the proximal right femur along
with bone fragments and small areas of heterotopic ossification show
no change since previous exam.

Left hip is located.

Signs of spinal fusion in the lumbar spine. Chronic deformity of
right iliac bone is similar to previous study. Signs of previous
right inferior pubic ramus fractures similar to prior as well.
IMPRESSION: Periprosthetic right acetabular fracture without displacement.

## 2020-10-14 ENCOUNTER — Telehealth: Payer: Self-pay | Admitting: Internal Medicine

## 2020-10-14 NOTE — Telephone Encounter (Signed)
Attempted to call pt but unable to reach. Left message for her to return call. 

## 2020-10-15 NOTE — Telephone Encounter (Signed)
Lmtcb for pt.  

## 2020-10-17 ENCOUNTER — Other Ambulatory Visit: Payer: Self-pay

## 2020-10-17 ENCOUNTER — Emergency Department (HOSPITAL_COMMUNITY)
Admission: EM | Admit: 2020-10-17 | Discharge: 2020-10-17 | Disposition: A | Discharge status: Home / Self Care | Payer: Medicare HMO | Attending: Emergency Medicine | Admitting: Emergency Medicine

## 2020-10-17 DIAGNOSIS — E875 Hyperkalemia: Secondary | ICD-10-CM | POA: Insufficient documentation

## 2020-10-17 DIAGNOSIS — N189 Chronic kidney disease, unspecified: Secondary | ICD-10-CM | POA: Insufficient documentation

## 2020-10-17 DIAGNOSIS — F17210 Nicotine dependence, cigarettes, uncomplicated: Secondary | ICD-10-CM | POA: Diagnosis not present

## 2020-10-17 DIAGNOSIS — I129 Hypertensive chronic kidney disease with stage 1 through stage 4 chronic kidney disease, or unspecified chronic kidney disease: Secondary | ICD-10-CM | POA: Diagnosis not present

## 2020-10-17 LAB — COMPREHENSIVE METABOLIC PANEL
ALT: 16 U/L (ref 0–44)
AST: 18 U/L (ref 15–41)
Albumin: 3.5 g/dL (ref 3.5–5.0)
Alkaline Phosphatase: 94 U/L (ref 38–126)
Anion gap: 12 (ref 5–15)
BUN: 42 mg/dL — ABNORMAL HIGH (ref 8–23)
CO2: 16 mmol/L — ABNORMAL LOW (ref 22–32)
Calcium: 9 mg/dL (ref 8.9–10.3)
Chloride: 108 mmol/L (ref 98–111)
Creatinine, Ser: 2.61 mg/dL — ABNORMAL HIGH (ref 0.44–1.00)
GFR, Estimated: 18 mL/min — ABNORMAL LOW (ref >60)
Glucose, Bld: 104 mg/dL — ABNORMAL HIGH (ref 70–99)
Potassium: 5.6 mmol/L — ABNORMAL HIGH (ref 3.5–5.1)
Sodium: 136 mmol/L (ref 135–145)
Total Bilirubin: 0.2 mg/dL — ABNORMAL LOW (ref 0.3–1.2)
Total Protein: 7 g/dL (ref 6.5–8.1)

## 2020-10-17 LAB — I-STAT CHEM 8, ED
BUN: 41 mg/dL — ABNORMAL HIGH (ref 8–23)
Calcium, Ion: 1.21 mmol/L (ref 1.15–1.40)
Chloride: 111 mmol/L (ref 98–111)
Creatinine, Ser: 2.9 mg/dL — ABNORMAL HIGH (ref 0.44–1.00)
Glucose, Bld: 101 mg/dL — ABNORMAL HIGH (ref 70–99)
HCT: 30 % — ABNORMAL LOW (ref 36.0–46.0)
Hemoglobin: 10.2 g/dL — ABNORMAL LOW (ref 12.0–15.0)
Potassium: 5.6 mmol/L — ABNORMAL HIGH (ref 3.5–5.1)
Sodium: 137 mmol/L (ref 135–145)
TCO2: 18 mmol/L — ABNORMAL LOW (ref 22–32)

## 2020-10-17 LAB — CBC WITH DIFFERENTIAL/PLATELET
Abs Immature Granulocytes: 0.04 10*3/uL (ref 0.00–0.07)
Basophils Absolute: 0.1 10*3/uL (ref 0.0–0.1)
Basophils Relative: 1 %
Eosinophils Absolute: 0.3 10*3/uL (ref 0.0–0.5)
Eosinophils Relative: 5 %
HCT: 32.1 % — ABNORMAL LOW (ref 36.0–46.0)
Hemoglobin: 9.9 g/dL — ABNORMAL LOW (ref 12.0–15.0)
Immature Granulocytes: 1 %
Lymphocytes Relative: 32 %
Lymphs Abs: 2.1 10*3/uL (ref 0.7–4.0)
MCH: 26.9 pg (ref 26.0–34.0)
MCHC: 30.8 g/dL (ref 30.0–36.0)
MCV: 87.2 fL (ref 80.0–100.0)
Monocytes Absolute: 0.7 10*3/uL (ref 0.1–1.0)
Monocytes Relative: 11 %
Neutro Abs: 3.4 10*3/uL (ref 1.7–7.7)
Neutrophils Relative %: 50 %
Platelets: 367 10*3/uL (ref 150–400)
RBC: 3.68 MIL/uL — ABNORMAL LOW (ref 3.87–5.11)
RDW: 17.7 % — ABNORMAL HIGH (ref 11.5–15.5)
WBC: 6.7 10*3/uL (ref 4.0–10.5)
nRBC: 0 % (ref 0.0–0.2)

## 2020-10-17 MED ORDER — SODIUM ZIRCONIUM CYCLOSILICATE 10 G PO PACK
10.0000 g | PACK | Freq: Once | ORAL | Status: AC
  Administered 2020-10-17: 10 g via ORAL
  Filled 2020-10-17: qty 1

## 2020-10-17 NOTE — ED Notes (Signed)
Pt deemed appropriate for discharge by MD bero

## 2020-10-17 NOTE — ED Provider Notes (Signed)
[facility] Emergency Department Provider Note MRN:  [id number]  Arrival date & time: 10/17/20     Chief Complaint   high K+   History of Present Illness   Alyssa [name] is a 79 y.o. year-old female with a history of PVD, CKD presenting to the ED with chief complaint of hypertension.  Patient sent here by PCP for high potassium. Explains that her potassium was low and she has been taking potassium pills, last took them yesterday. Denies any symptoms, feels completely normal. Denies any nausea vomiting or diarrhea, drinking plenty of water. No fever or cough, no chest pain or shortness of breath, no abdominal pain.  Review of Systems  A complete 10 system review of systems was obtained and all systems are negative except as noted in the HPI and PMH.   Patient's Health History    Past Medical History:  Diagnosis Date  . Acute respiratory failure with hypoxia ([place]) 11/01/2019  . Anxiety   . Arthritis   . Chest pain   . Chronic kidney disease   . Dyspnea   . Elevated troponin 12/13/2018  . Headache(784.0)   . Hyperlipidemia   . Hypertension   . Joint pain   . Leg pain   . Peripheral neuropathy   . Peripheral vascular disease ([place])   . Syncope 09/2020    Past Surgical History:  Procedure Laterality Date  . ABDOMINAL ANGIOGRAM N/A 10/29/2011   Procedure: ABDOMINAL ANGIOGRAM;  Surgeon: Elam Dutch, MD;  Location: [facility] CATH LAB;  Service: Cardiovascular;  Laterality: N/A;  . BACK SURGERY    . FEMORAL-FEMORAL BYPASS GRAFT  08/31/2010  . FLEXIBLE SIGMOIDOSCOPY N/A 06/12/2019   Procedure: FLEXIBLE SIGMOIDOSCOPY;  Surgeon: Ladene Artist, MD;  Location: [facility] ENDOSCOPY;  Service: Endoscopy;  Laterality: N/A;  Patient had little bit to eat this morning so this will be unsedated.  Marland Kitchen JOINT REPLACEMENT Right 2012   Hip  . LOWER EXTREMITY ANGIOGRAPHY  06/14/2017   Procedure: Lower Extremity Angiography;  Surgeon: Adrian Prows, MD;  Location: [place] CV  LAB;  Service: Cardiovascular;;  Bilateral limited angio performed  . RENAL ANGIOGRAPHY N/A 06/14/2017   Procedure: Renal Angiography;  Surgeon: Adrian Prows, MD;  Location: [place] CV LAB;  Service: Cardiovascular;  Laterality: N/A;  . TOTAL HIP ARTHROPLASTY     right    Family History  Problem Relation Age of Onset  . Heart attack Mother   . Heart disease Mother   . Hyperlipidemia Mother   . Hypertension Mother   . Heart attack Father   . Heart disease Father        Before age 85  . Hyperlipidemia Father   . Hypertension Father   . Cancer Sister        brain tumor  . Aneurysm Brother        brain  . Colon cancer Neg Hx   . Liver cancer Neg Hx     Social History   Socioeconomic History  . Marital status: Single    Spouse name: Not on file  . Number of children: 2  . Years of education: 11th  . Highest education level: Not on file  Occupational History  . Occupation: Retired  Tobacco Use  . Smoking status: Current Every Day Smoker    Packs/day: 1.00    Years: 27.00    Pack years: 27.00    Types: Cigarettes  . Smokeless tobacco: Never Used  . Tobacco comment: smokes 1 pack  per day 09/25/2020  Vaping Use  . Vaping Use: Never used  Substance and Sexual Activity  . Alcohol use: No  . Drug use: No  . Sexual activity: Not on file  Other Topics Concern  . Not on file  Social History Narrative    Lives at home with her brother.   Right-handed.   Occasional caffeine use.   Social Determinants of Health   Financial Resource Strain: Not on file  Food Insecurity: Not on file  Transportation Needs: Not on file  Physical Activity: Not on file  Stress: Not on file  Social Connections: Not on file  Intimate Partner Violence: Not on file     Physical Exam   Vitals:   10/17/20 1815 10/17/20 1830  BP: (!) 200/120 (!) 193/107  Pulse: 65 66  Resp: 16 17  Temp:    SpO2: 100% 100%    CONSTITUTIONAL: Well-appearing, NAD NEURO:  Alert and oriented x 3, no focal  deficits EYES:  eyes equal and reactive ENT/NECK:  no LAD, no JVD CARDIO: Regular rate, well-perfused, normal S1 and S2 PULM:  CTAB no wheezing or rhonchi GI/GU:  normal bowel sounds, non-distended, non-tender MSK/SPINE:  No gross deformities, no edema SKIN:  no rash, atraumatic PSYCH:  Appropriate speech and behavior  *Additional and/or pertinent findings included in MDM below  Diagnostic and Interventional Summary    EKG Interpretation  Date/Time:  Friday October 17 2020 11:40:12 EST Ventricular Rate:  79 PR Interval:  168 QRS Duration: 70 QT Interval:  386 QTC Calculation: 442 R Axis:   86 Text Interpretation: Normal sinus rhythm Possible Anterior infarct , age undetermined ST & T wave abnormality, consider inferolateral ischemia Abnormal ECG Confirmed by Gerlene Fee [phone]) on 10/17/2020 4:23:40 PM      Labs Reviewed  CBC WITH DIFFERENTIAL/PLATELET - Abnormal; Notable for the following components:      Result Value   RBC 3.68 (*)    Hemoglobin 9.9 (*)    HCT 32.1 (*)    RDW 17.7 (*)    All other components within normal limits  COMPREHENSIVE METABOLIC PANEL - Abnormal; Notable for the following components:   Potassium 5.6 (*)    CO2 16 (*)    Glucose, Bld 104 (*)    BUN 42 (*)    Creatinine, Ser 2.61 (*)    Total Bilirubin 0.2 (*)    GFR, Estimated 18 (*)    All other components within normal limits  I-STAT CHEM 8, ED - Abnormal; Notable for the following components:   Potassium 5.6 (*)    BUN 41 (*)    Creatinine, Ser 2.90 (*)    Glucose, Bld 101 (*)    TCO2 18 (*)    Hemoglobin 10.2 (*)    HCT 30.0 (*)    All other components within normal limits    No orders to display    Medications  sodium zirconium cyclosilicate (LOKELMA) packet 10 g (10 g Oral Given 10/17/20 1848)     Procedures  /  Critical Care Procedures  ED Course and Medical Decision Making  I have reviewed the triage vital signs, the nursing notes, and pertinent available records  from the EMR.  Listed above are laboratory and imaging tests that I personally ordered, reviewed, and interpreted and then considered in my medical decision making (see below for details).  Suspect the mild hyperkalemia is related to potassium supplementation with some mild worsening renal function. Increase in creatinine and acidosis on labs today,  will discuss with nephrology and provide dose of Lokelma. EKG is reassuring. Suspect patient will be able to be discharged to her nephrologist, has follow-up established.     Discussed case with Dr. Joylene Grapes of nephrology who agrees with plan for dose of Lokelma, stopping potassium pills, lowering potassium intake in the diet, close follow-up with her personal nephrologist.  Barth Kirks. [place] Small, [place] [email or web address]  Final Clinical Impressions(s) / ED Diagnoses     ICD-10-CM   1. Hyperkalemia  E87.5   2. Chronic kidney disease, unspecified CKD stage  N18.9     ED Discharge Orders    None       Discharge Instructions Discussed with and Provided to Patient:     Discharge Instructions     You were evaluated in the Emergency Department and after careful evaluation, we did not find any emergent condition requiring admission or further testing in the hospital.  Your exam/testing today was overall reassuring.  We recommend close follow-up with your kidney doctor.  Please do not take any more potassium pills at home.  Please return to the Emergency Department if you experience any worsening of your condition.  Thank you for allowing [place] to be a part of your care.        Maudie Flakes, MD 10/17/20 [phone]

## 2020-10-17 NOTE — ED Notes (Signed)
Patient verbalizes understanding of discharge instructions. Opportunity for questioning and answers were provided. Armband removed by staff, pt discharged from ED in wheelchair to lobby to await transportation. Lobby Staff made aware.

## 2020-10-17 NOTE — ED Triage Notes (Signed)
Pt here pov stating that she was told her K+ was high. Pt denies cp or sob

## 2020-10-17 NOTE — Discharge Instructions (Addendum)
You were evaluated in the Emergency Department and after careful evaluation, we did not find any emergent condition requiring admission or further testing in the hospital.  Your exam/testing today was overall reassuring.  We recommend close follow-up with your kidney doctor.  Please do not take any more potassium pills at home.  Please return to the Emergency Department if you experience any worsening of your condition.  Thank you for allowing [place] to be a part of your care.

## 2020-10-20 NOTE — Telephone Encounter (Signed)
lmtcb X3 for pt.  Will close encounter per triage protocol.  

## 2020-10-25 IMAGING — US US RENAL
1 series · 14 of 25 positions shown · non-contrast
Comparison: 03/08/2019

CLINICAL DATA: Acute kidney injury

EXAM:
RENAL / URINARY TRACT ULTRASOUND COMPLETE

[Series 1: us renal · 14 of 34 slices shown]
[im 1/34]
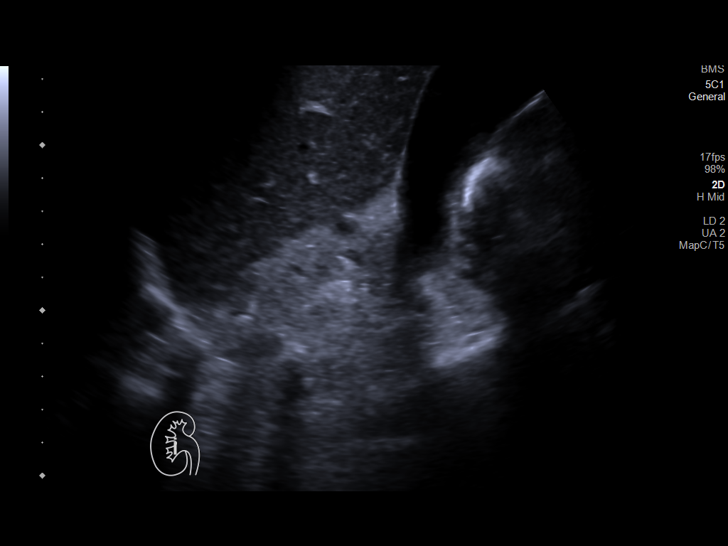
[im 3/34]
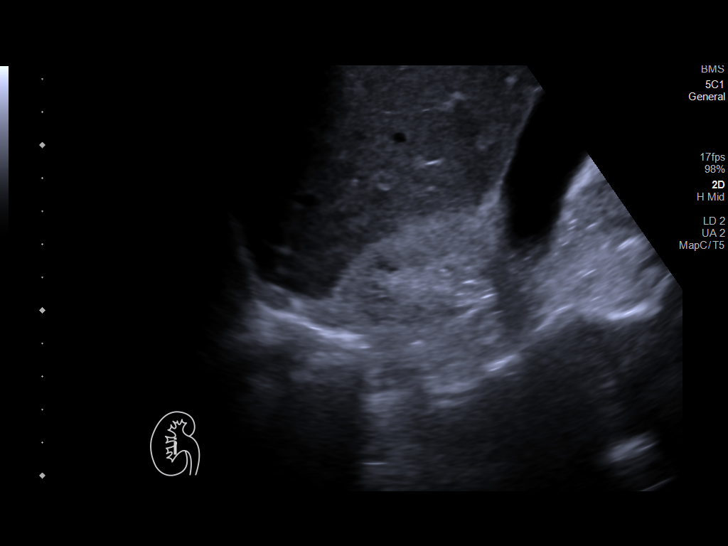
[im 6/34]
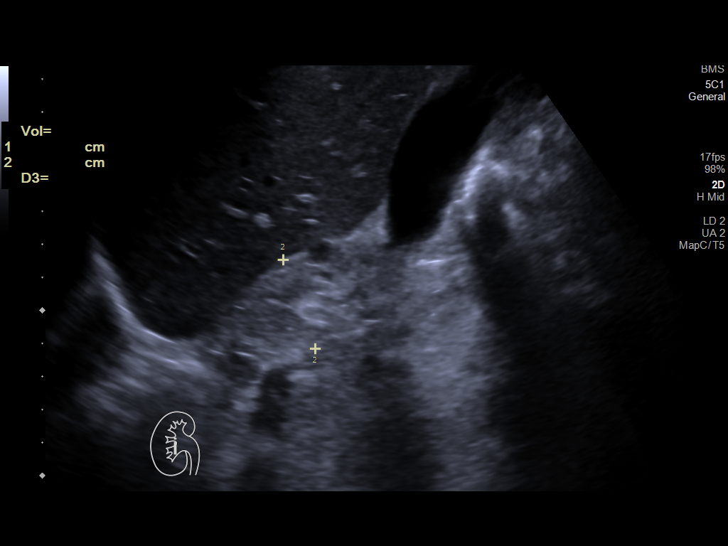
[im 9/34]
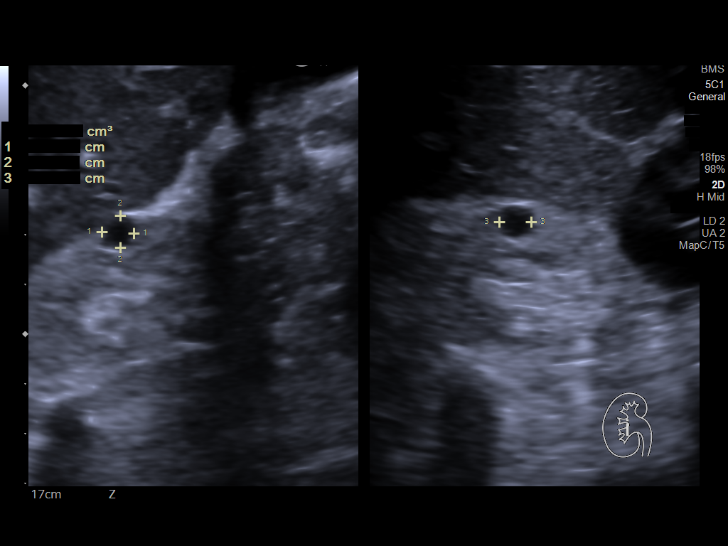
[im 12/34]
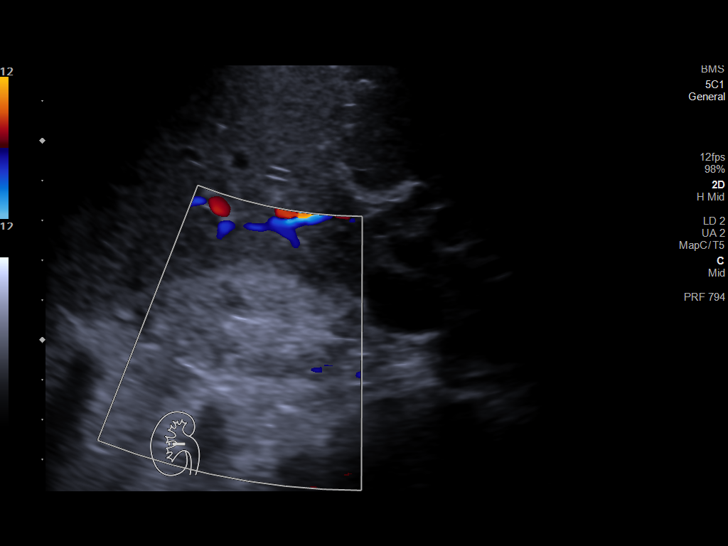
[im 13/34]
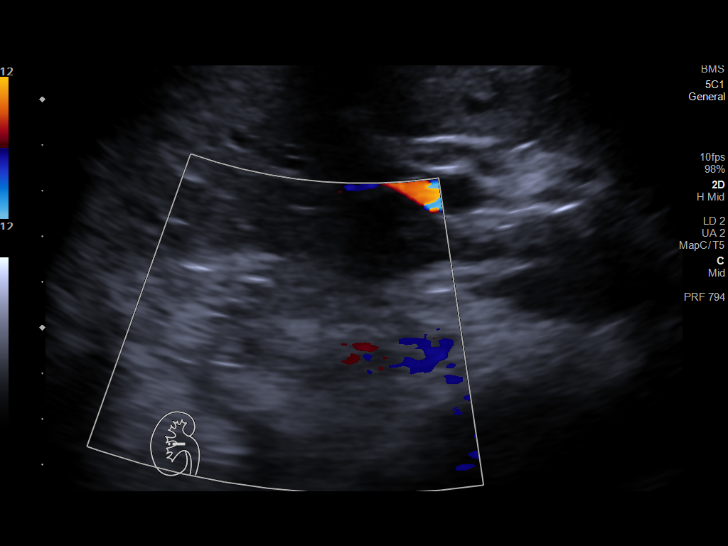
[im 16/34]
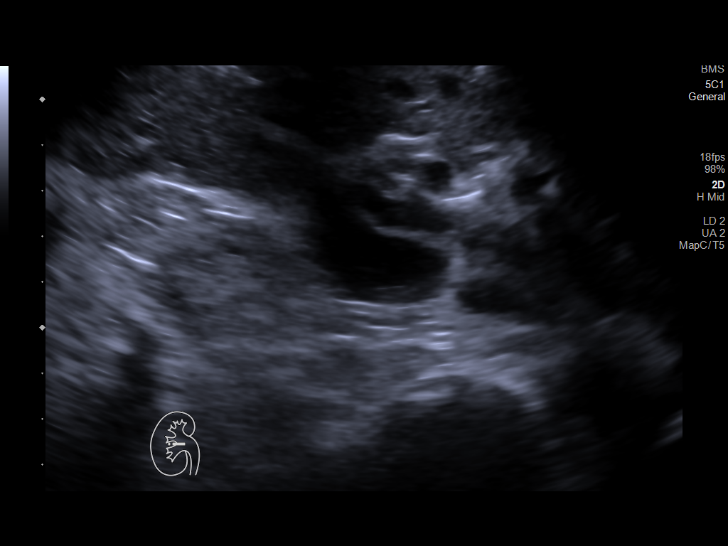
[im 18/34]
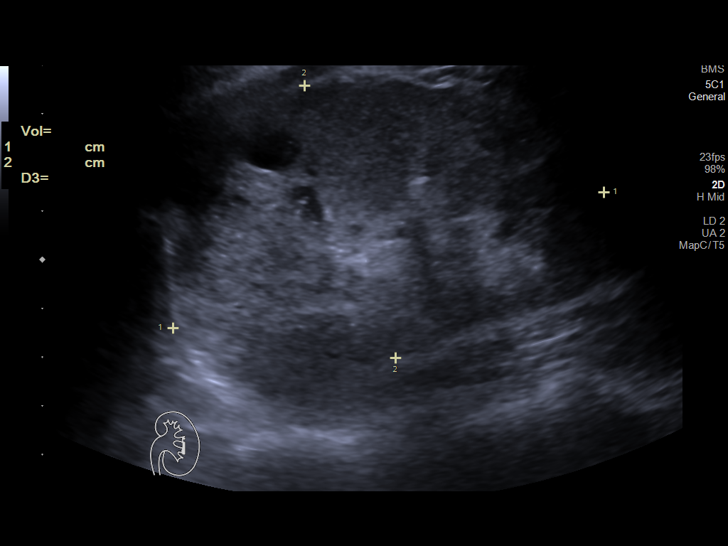
[im 21/34]
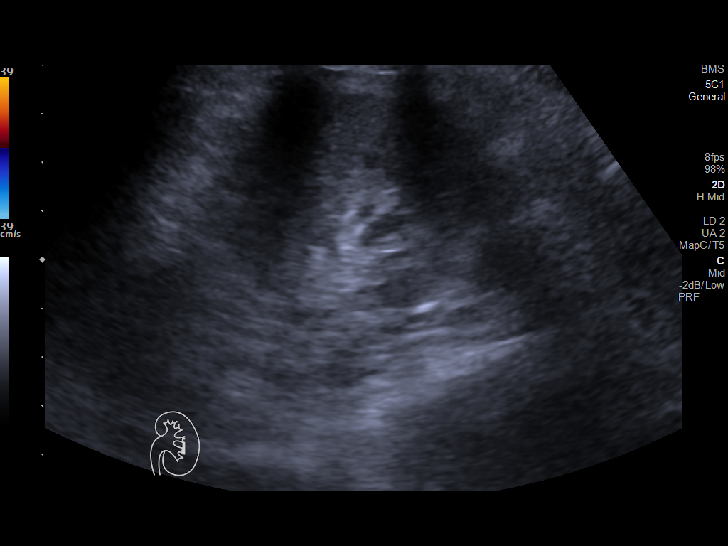
[im 23/34]
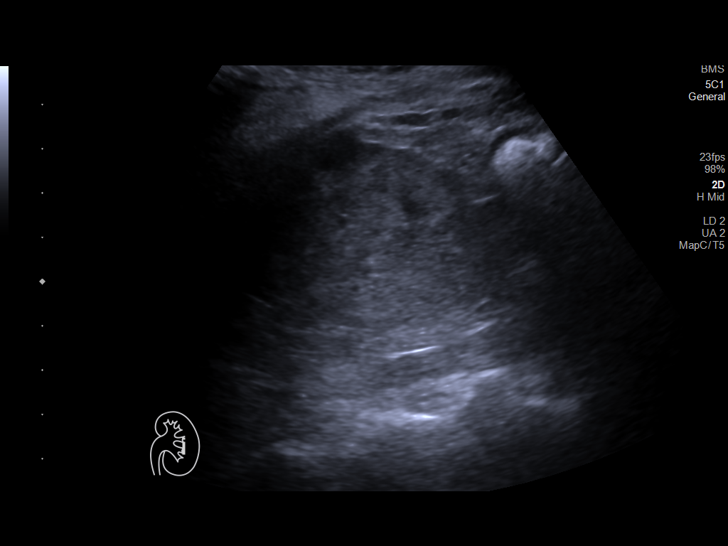
[im 25/34]
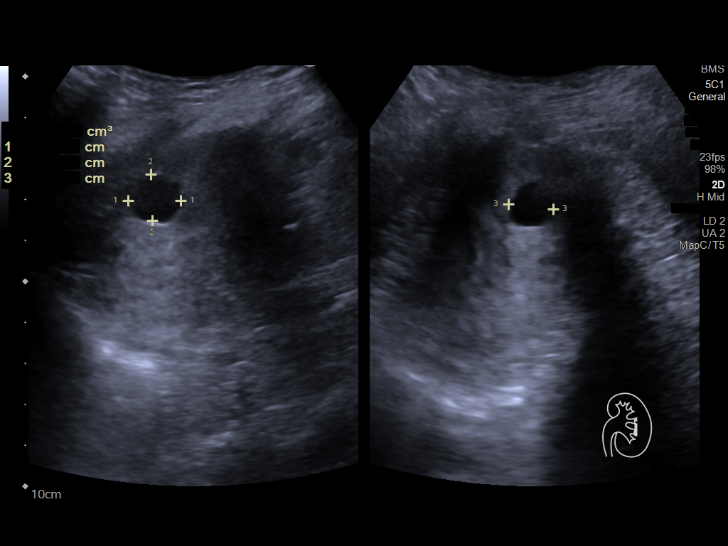
[im 28/34]
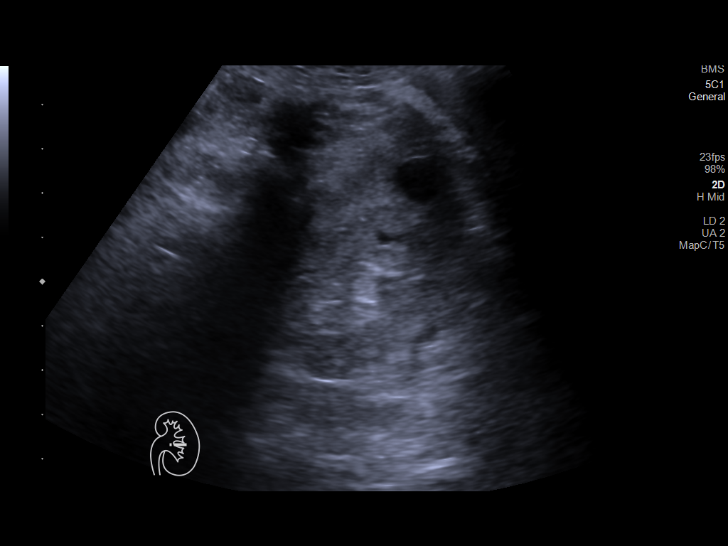
[im 31/34]
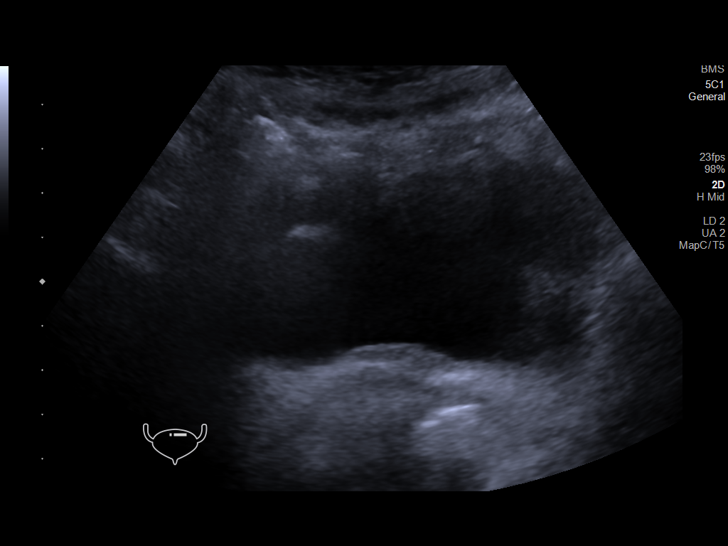
[im 34/34]
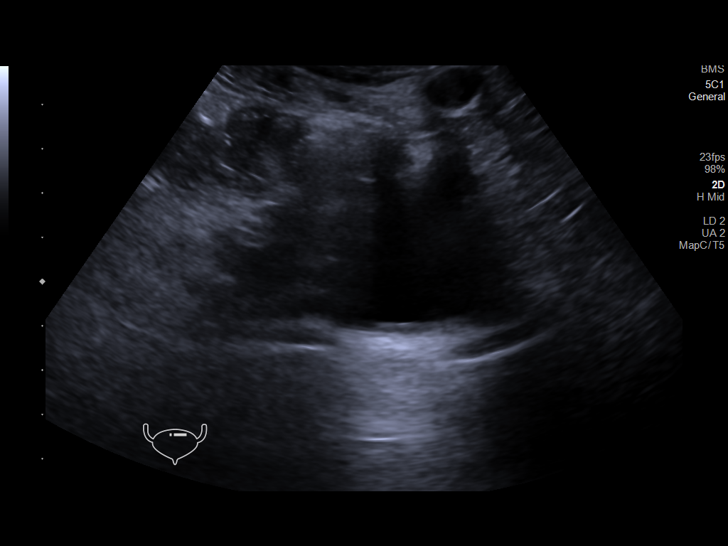

[14 of 25 positions shown; findings below may reference images not displayed]

FINDINGS: Right Kidney:

Renal measurements: 7.1 x 2.9 x 3.4 cm = volume: 36 mL. Severely
atrophic with diffusely increased echotexture. 6 mm cyst. No
hydronephrosis.

Left Kidney:

Renal measurements: 9.3 x 5.9 x 4.5 cm = volume: 130 mL. Small
cysts, the largest 1.3 cm in the mid to upper pole. Diffuse
increased echotexture. No hydronephrosis.

Bladder:

Decompressed, difficult to evaluate walls.

Other:

None.
IMPRESSION: Diffusely increased echotexture compatible chronic medical renal
disease.

Severely atrophic right kidney.

No hydronephrosis.

## 2020-10-25 IMAGING — MR MR HEAD W/O CM
10 series · 48 of 48 positions shown · non-contrast
Comparison: Brain MRI 12/05/2017.  Head CT earlier tonight.

CLINICAL DATA: 78-year-old female with slurred speech and altered
mental status.

EXAM:
MRI HEAD WITHOUT CONTRAST
TECHNIQUE: Multiplanar, multiecho pulse sequences of the brain and surrounding
structures were obtained without intravenous contrast.

[Series 5: DWI · axial · 3.0mm · 0.88mm/px · z∈[-67,+63]mm · 11 of 92 slices shown (1 of 4)]
[im 1/92]
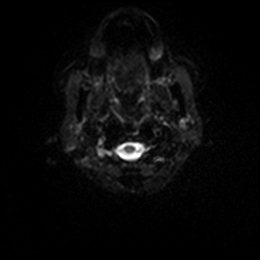
[im 10/92]
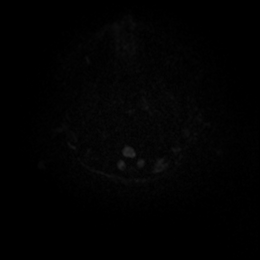
[im 19/92]
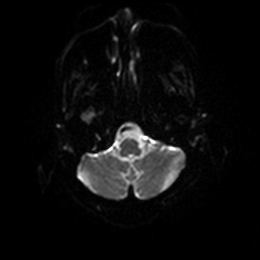
[im 28/92]
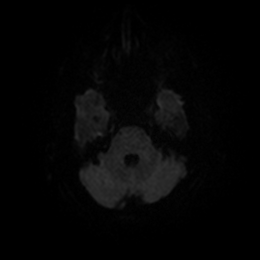
[im 37/92]
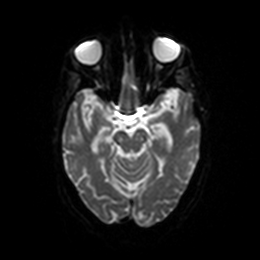
[im 46/92]
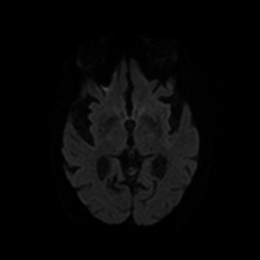
[im 55/92]
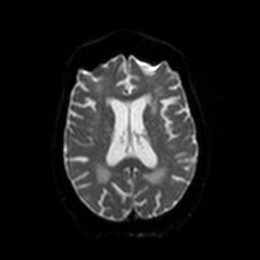
[im 64/92]
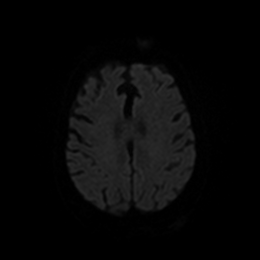
[im 73/92]
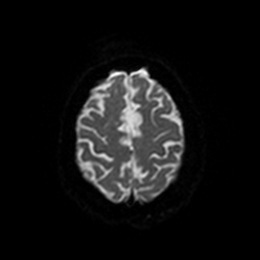
[im 82/92]
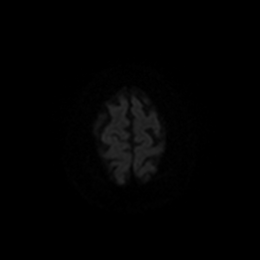
[im 92/92]
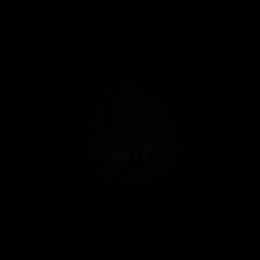

[Series 6: DWI · axial · 3.0mm · 0.88mm/px · z∈[-67,+60]mm · 6 of 45 slices shown (2 of 4)]
[im 1/45]
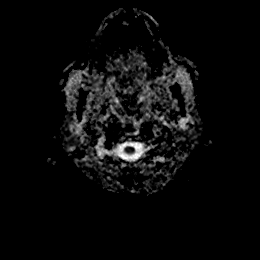
[im 9/45]
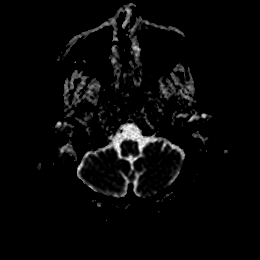
[im 18/45]
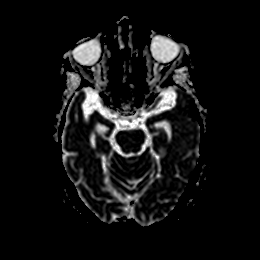
[im 27/45]
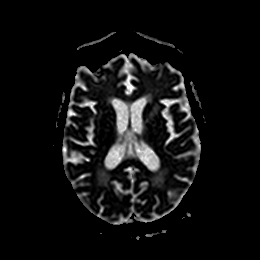
[im 36/45]
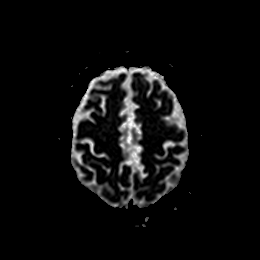
[im 45/45]
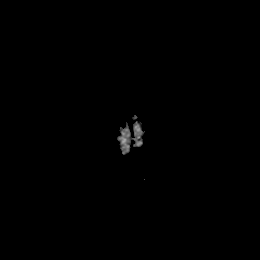

[Series 7: DWI · coronal · 4.0mm · 0.88mm/px · 8 of 64 slices shown (3 of 4)]
[im 1/64]
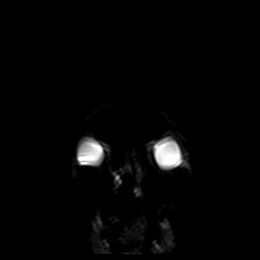
[im 10/64]
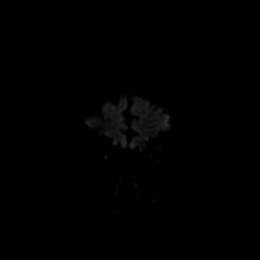
[im 19/64]
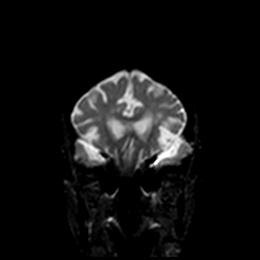
[im 28/64]
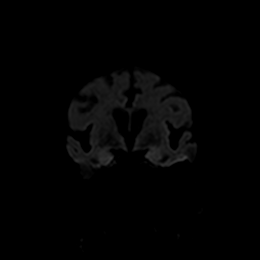
[im 37/64]
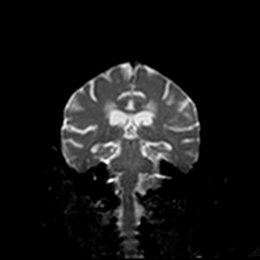
[im 46/64]
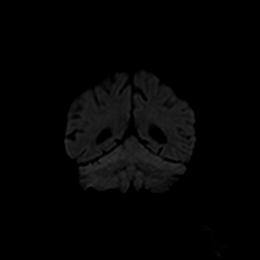
[im 55/64]
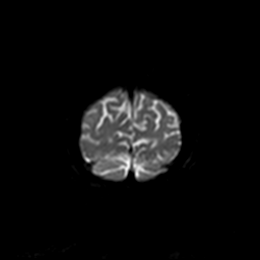
[im 64/64]
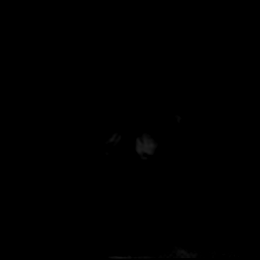

[Series 8: DWI · coronal · 4.0mm · 0.88mm/px · 4 of 32 slices shown (4 of 4)]
[im 1/32]
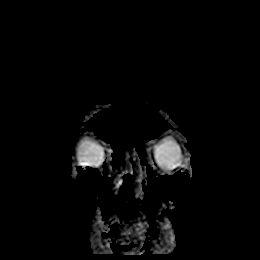
[im 11/32]
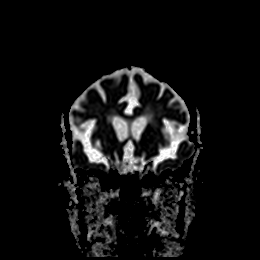
[im 21/32]
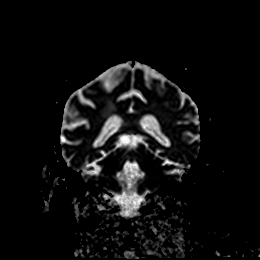
[im 32/32]
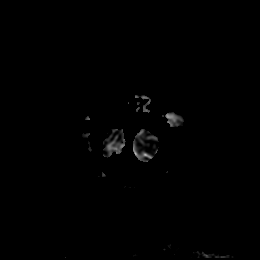

[Series 9: T1 · sagittal · 5.0mm · 0.75mm/px · 3 of 23 slices shown]
[im 1/23]
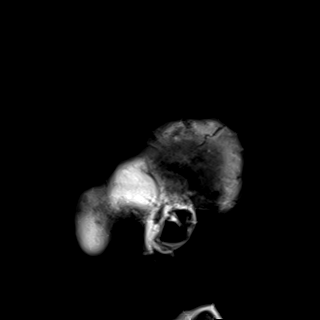
[im 12/23]
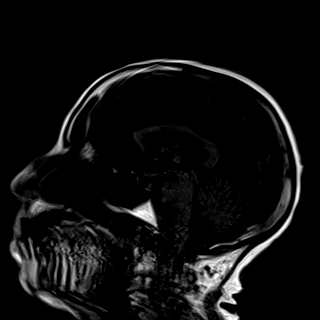
[im 23/23]
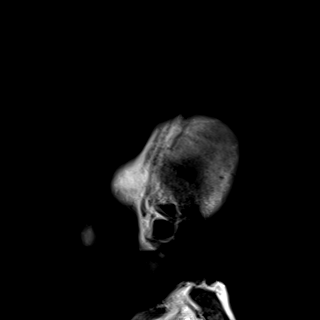

[Series 10: T2 · axial · 5.0mm · 0.72mm/px · z∈[-65,+74]mm · 3 of 25 slices shown (1 of 2)]
[im 1/25]
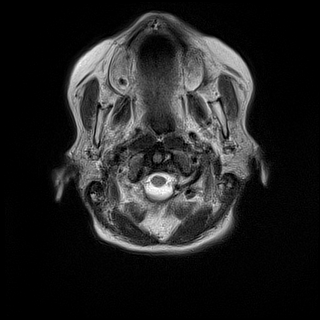
[im 13/25]
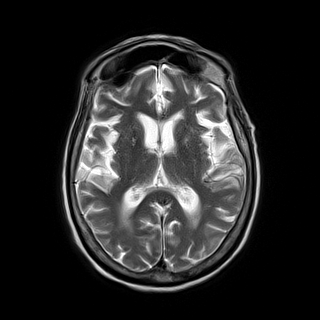
[im 25/25]
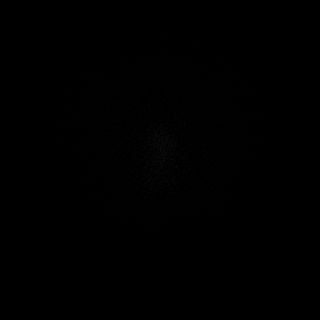

[Series 11: FLAIR · axial · 5.0mm · 0.90mm/px · z∈[-72,+67]mm · 3 of 25 slices shown (1 of 2)]
[im 1/25]
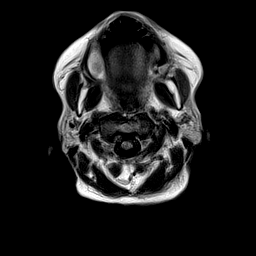
[im 13/25]
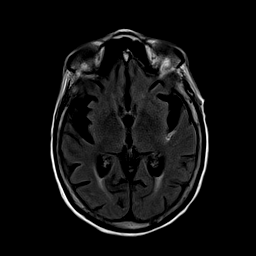
[im 25/25]
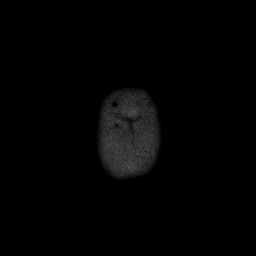

[Series 12: FLAIR · axial · 5.0mm · 0.90mm/px · z∈[-68,+71]mm · 3 of 25 slices shown (2 of 2)]
[im 1/25]
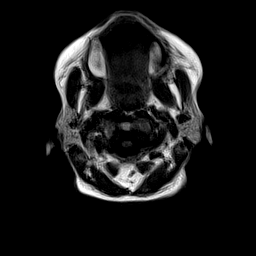
[im 13/25]
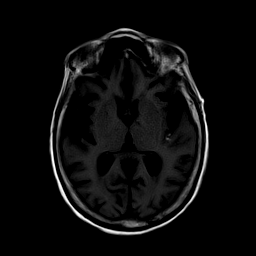
[im 25/25]
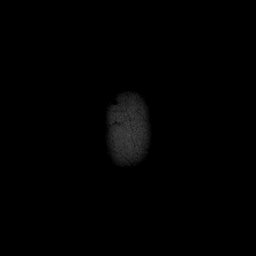

[Series 13: T2 · coronal · 5.0mm · 0.72mm/px · 4 of 28 slices shown (2 of 2)]
[im 1/28]
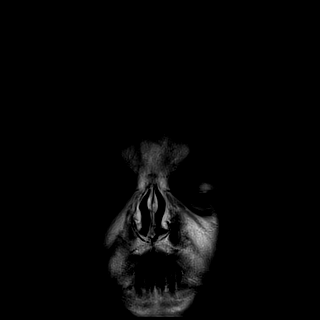
[im 10/28]
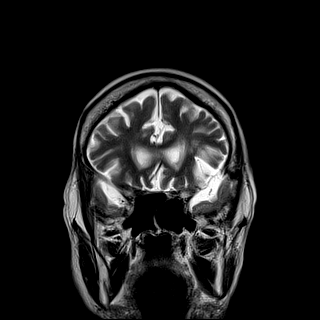
[im 19/28]
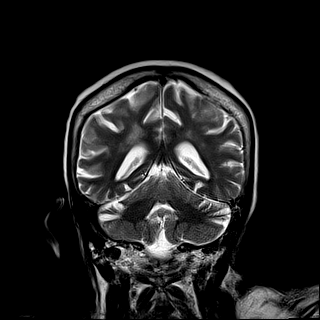
[im 28/28]
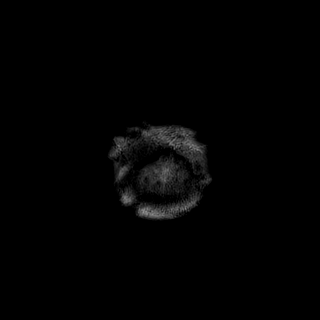

[Series 14: ax hemo · axial · 5.0mm · 0.86mm/px · z∈[-71,+68]mm · 3 of 25 slices shown]
[im 1/25]
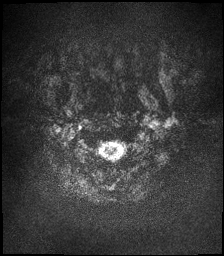
[im 13/25]
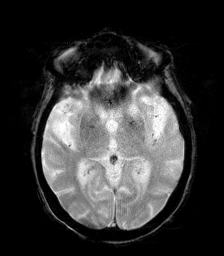
[im 25/25]
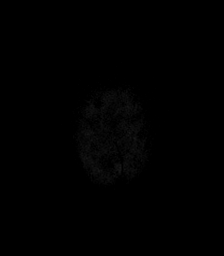

[48 of 48 positions shown; findings below may reference images not displayed]

FINDINGS: Brain: There is trace abnormal T1 and FLAIR signal in the left
sylvian fissure on series 11, image 13 and series 12, image 13 which
corresponds to subtle hyperdensity on the earlier CT. This is new
compared to the 1914 MRI. Regional flow voids on T2 imaging seem to
remain stable. No diffusion restriction in the region. Only subtle
associated T2* signal loss.

No similar abnormal subarachnoid signal elsewhere. No
intraventricular blood or debris. No restricted diffusion to suggest
acute infarction. No midline shift, mass effect, evidence of mass
lesion, ventriculomegaly. Pituitary normal limits.

Motion artifact on sagittal T1 weighted [[facility]] the
cervicomedullary junction and upper cervical spine.

Chronic widely scattered and patchy bilateral cerebral white matter
T2 and FLAIR hyperintensity appears not significantly changed since
1914. No definite cortical encephalomalacia. No chronic cerebral
blood products suspected.

Vascular: Major intracranial vascular flow voids are stable.
Dominant left vertebral artery.

Skull and upper cervical spine: Visualized bone marrow signal is
within normal limits. Cervical spine obscured by motion.

Sinuses/Orbits: Stable, negative.

Other: Mastoids remain clear. Scalp and face soft tissues appear
negative.
IMPRESSION: 1. Small volume abnormal T1 and FLAIR signal which seems to be in
the posterior left sylvian fissure is favored to correspond to Trace
Subarachnoid Hemorrhage which was subtle on the head CT at 1115
hours yesterday.
Although nonspecific similar small volume subarachnoid blood can be
seen in the elderly after a fall.
Consider a repeat non-contrast Head CT in the next several hours
(coming up on 12 hours from the prior) for correlation.

2. No associated acute infarct or other acute intracranial
abnormality identified.

## 2020-10-25 IMAGING — DX DG CHEST 1V PORT
1 series · 1 of 1 positions shown · non-contrast
Comparison: 03/22/2019 chest radiograph.

CLINICAL DATA: Altered mental status, slurred speech

EXAM:
PORTABLE CHEST 1 VIEW

[chest ap]
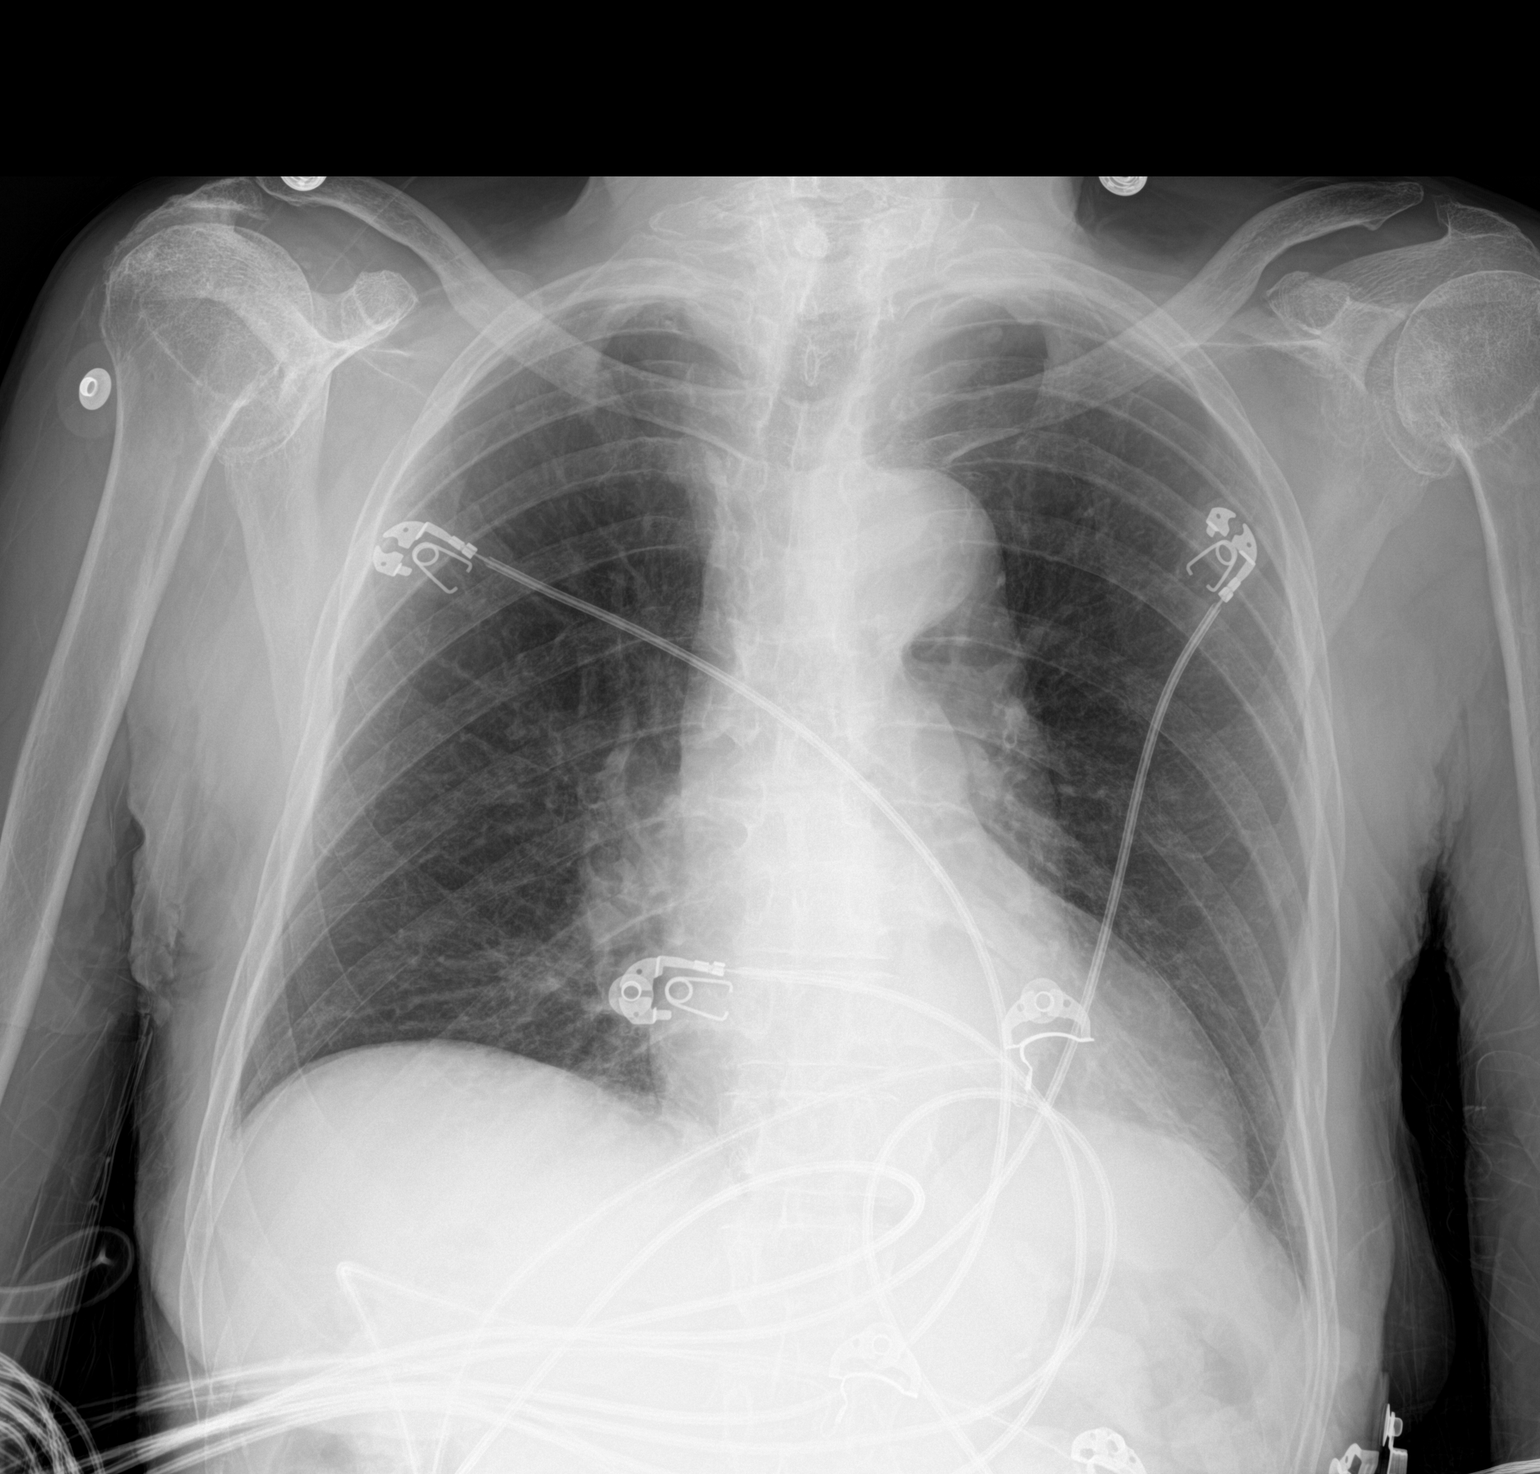

[1 of 1 positions shown; findings below may reference images not displayed]

FINDINGS: Stable cardiomediastinal silhouette with normal heart size. No
pneumothorax. No pleural effusion. Lungs appear clear, with no acute
consolidative airspace disease and no pulmonary edema.
IMPRESSION: No active disease.

## 2020-10-25 IMAGING — CT CT HEAD W/O CM
4 series · 16 of 47 positions shown, 18 images · non-contrast
Comparison: 12/05/2017 MRI.  CT head dated 06/23/2017.

CLINICAL DATA: Focal neuro deficit. Slurred speech and altered
mental status since [REDACTED].

EXAM:
CT HEAD WITHOUT CONTRAST
TECHNIQUE: Contiguous axial images were obtained from the base of the skull
through the vertex without intravenous contrast.

[Series 3: head without · axial · non-contrast · 0.41mm/px · z∈[+1052,+1172]mm · 7 of 32 slices shown, 9 images]
[im 4/32  brain]
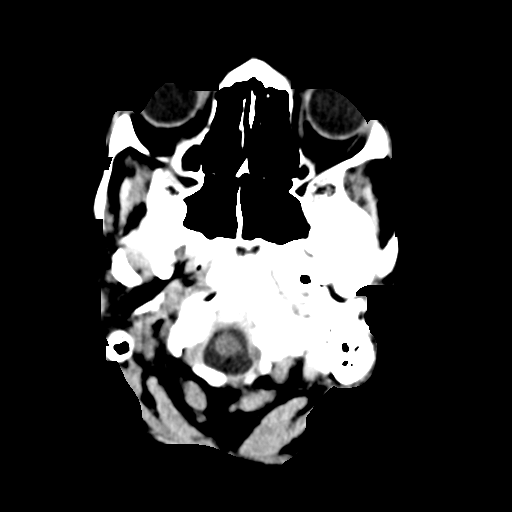
[im 4/32  bone]
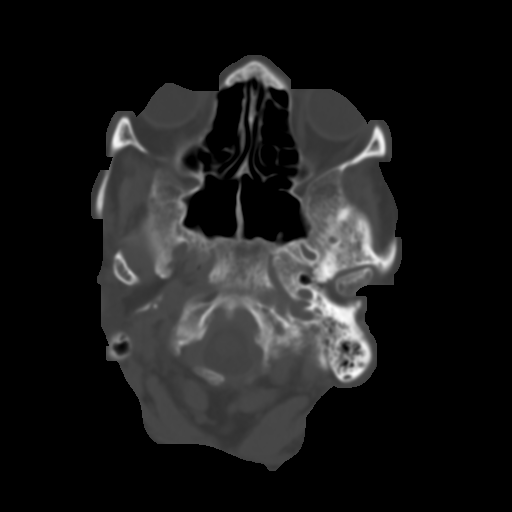
[im 8/32  brain]
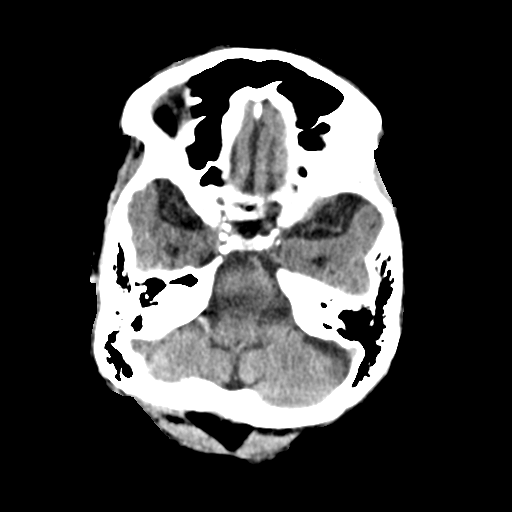
[im 12/32  brain]
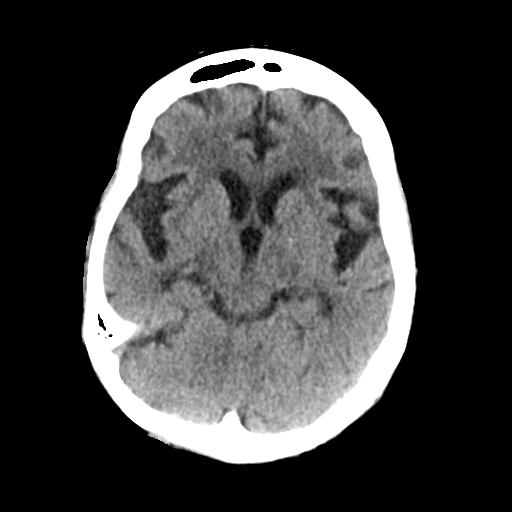
[im 16/32  brain]
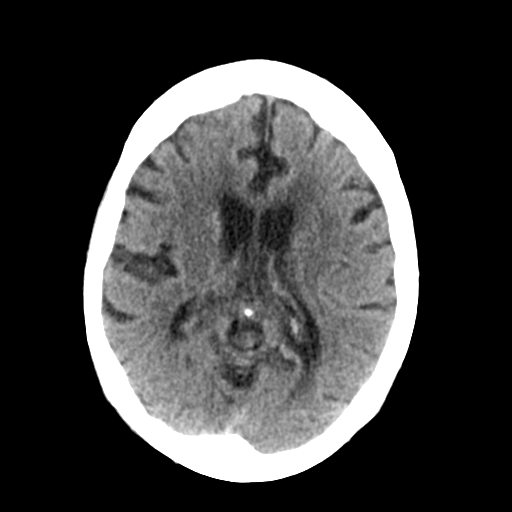
[im 20/32  brain]
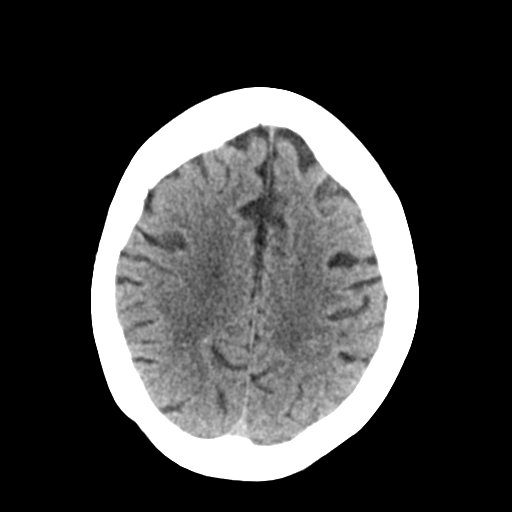
[im 20/32  bone]
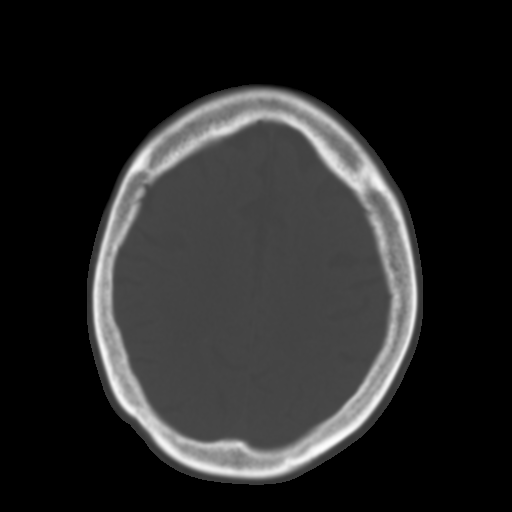
[im 24/32  brain]
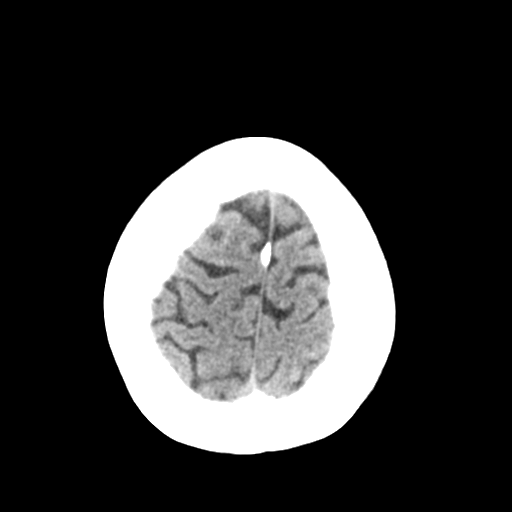
[im 28/32  brain]
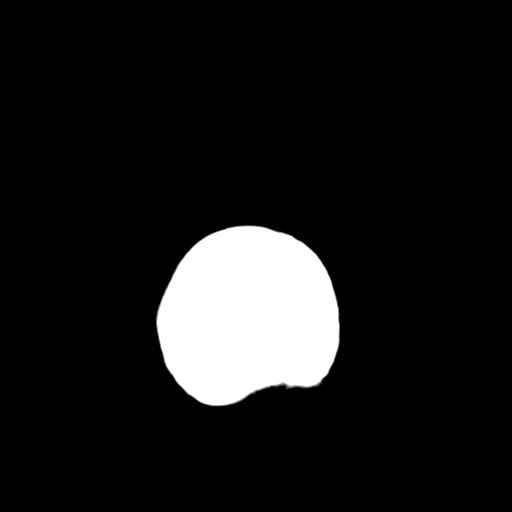

[Series 4: head bone · axial · 0.41mm/px · z∈[+1051,+1083]mm · 3 of 79 slices shown]
[im 8/79  bone]
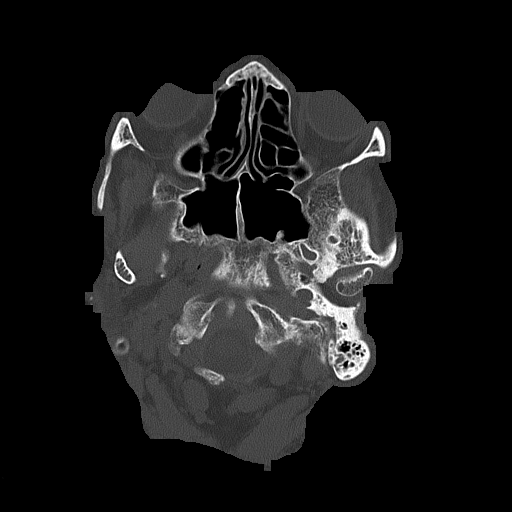
[im 16/79  bone]
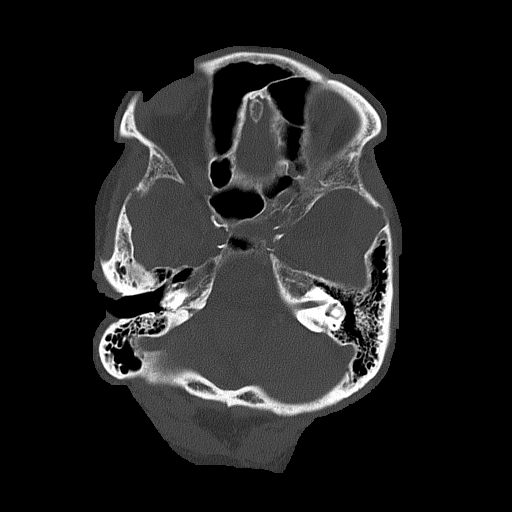
[im 24/79  bone]
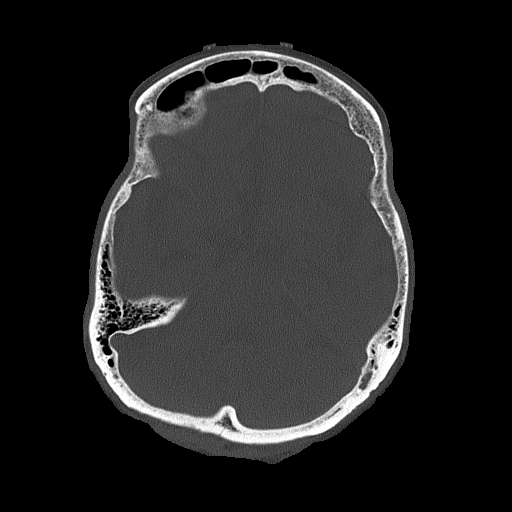

[Series 5: head without cor · coronal · non-contrast · 0.31mm/px · 3 of 68 slices shown]
[im 23/68  brain]
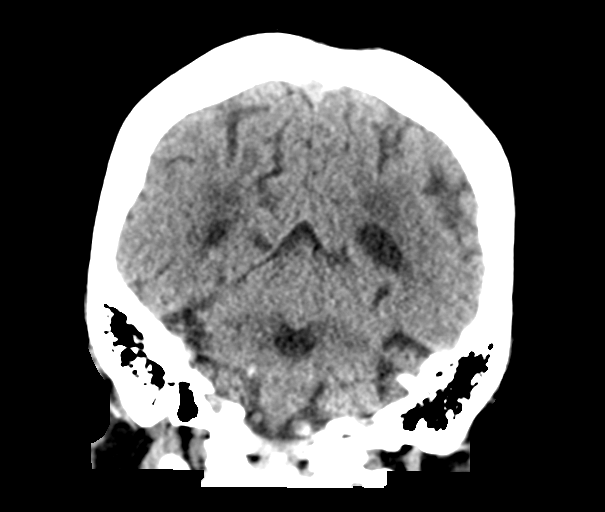
[im 30/68  brain]
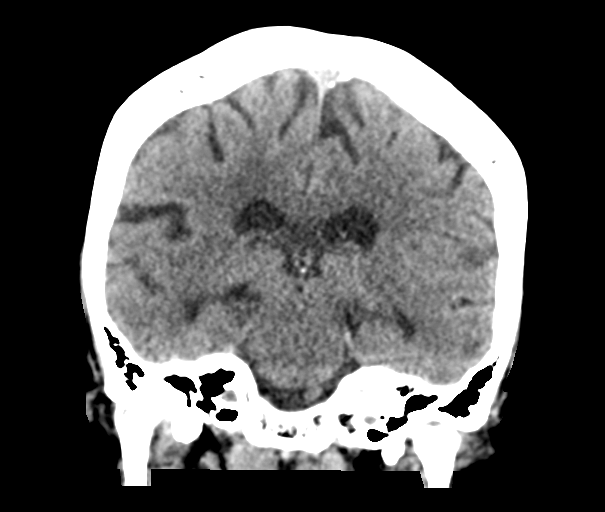
[im 38/68  brain]
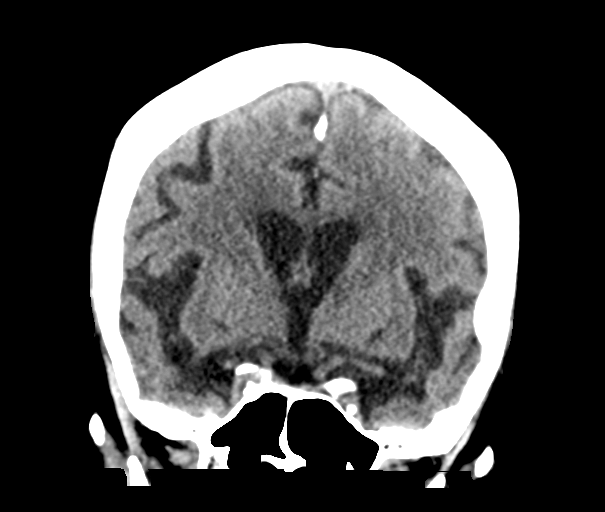

[Series 6: head without sag · sagittal · non-contrast · 0.31mm/px · 3 of 49 slices shown]
[im 17/49  brain]
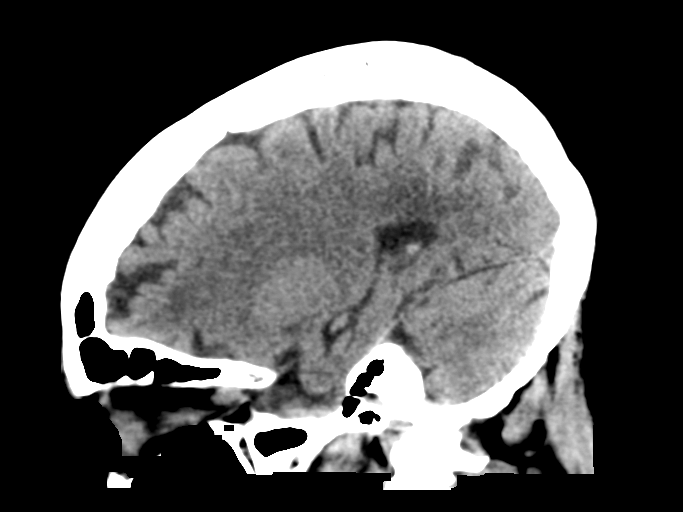
[im 25/49  brain]
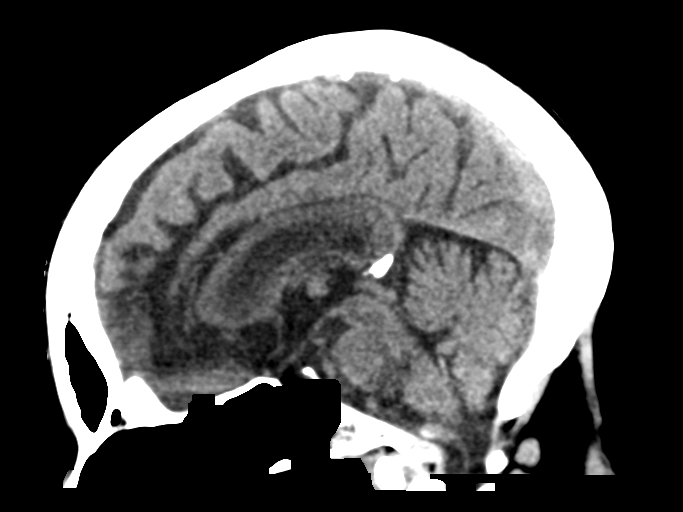
[im 33/49  brain]
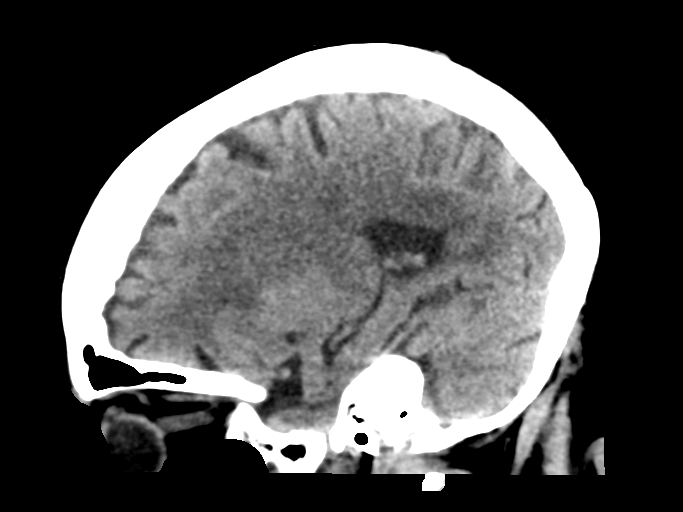

[16 of 47 positions shown; findings below may reference images not displayed]

FINDINGS: Brain: No evidence of acute infarction, hemorrhage, hydrocephalus,
extra-axial collection or mass lesion/mass effect. Mild age related
atrophy and chronic microvascular ischemic changes are noted
bilaterally.

Vascular: No hyperdense vessel or unexpected calcification.

Skull: Normal. Negative for fracture or focal lesion. There is
congenital nonunion of the posterior arch of C1.

Sinuses/Orbits: No acute finding.

Other: None.
IMPRESSION: No acute intracranial abnormality.

## 2020-10-26 IMAGING — MR MR MRA HEAD W/O CM
2 series · 18 of 48 positions shown · non-contrast
Comparison: Brain MRI performed earlier the same day 09/18/2019,
head CT 09/17/2019

CLINICAL DATA: Cerebral aneurysm, subarachnoid hemorrhage, cerebral
vasospasm evaluation.

EXAM:
MRA HEAD WITHOUT CONTRAST
TECHNIQUE: Angiographic images of the Circle of Willis were obtained using MRA
technique without intravenous contrast.

[Series 13: 3d cow · axial · 0.8mm · 0.41mm/px · z∈[-181,-89]mm · 17 of 130 slices shown]
[im 1/130]
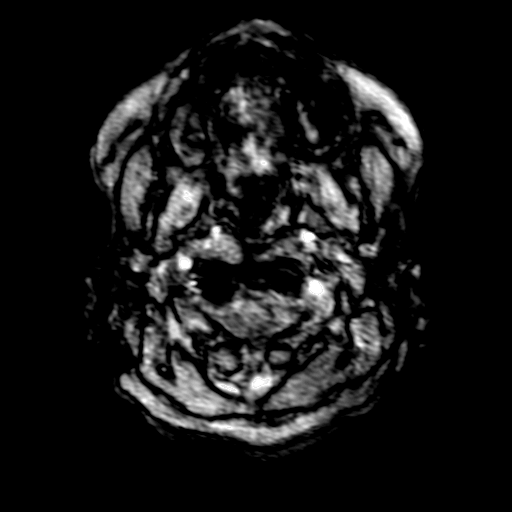
[im 3/130]
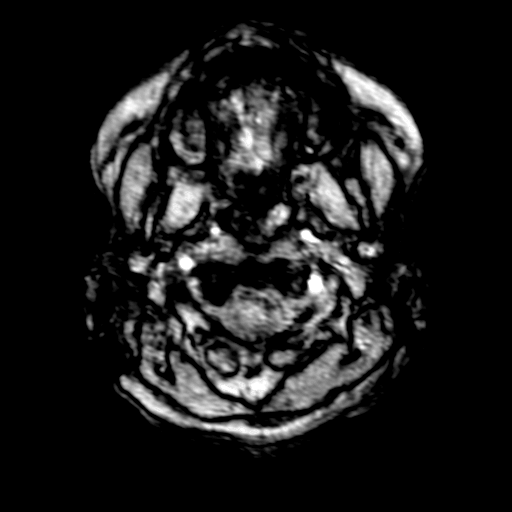
[im 6/130]
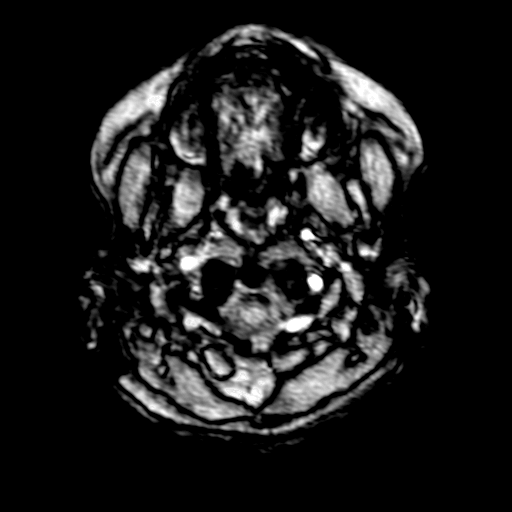
[im 9/130]
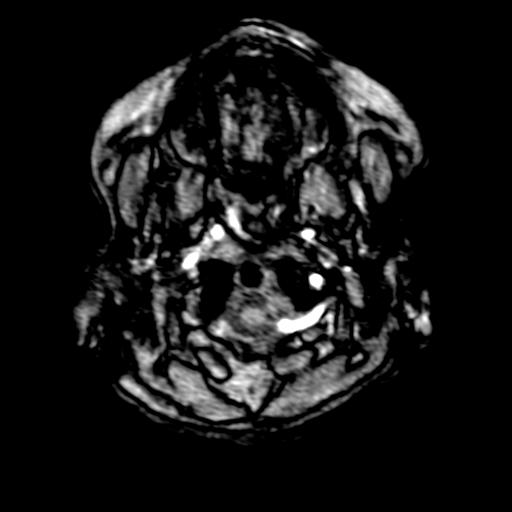
[im 12/130]
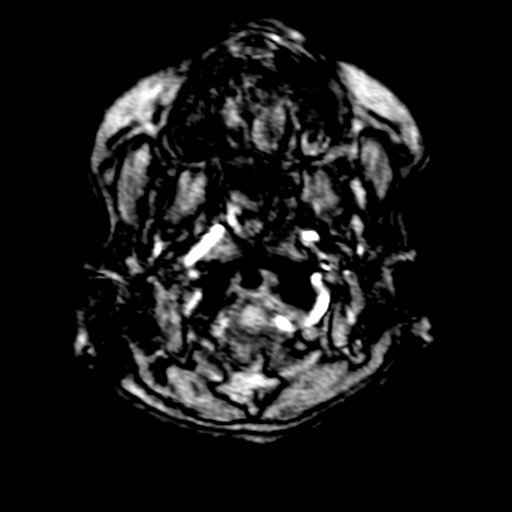
[im 15/130]
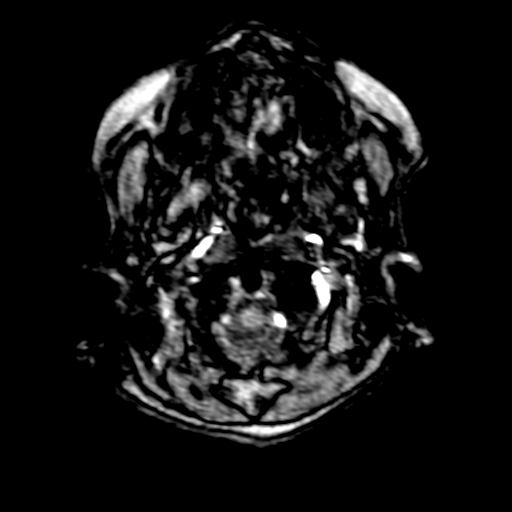
[im 17/130]
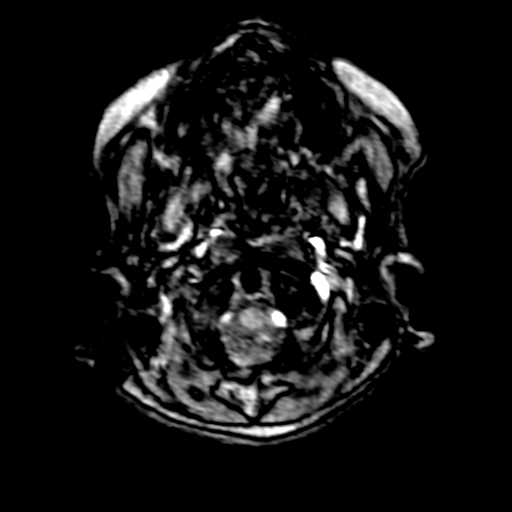
[im 20/130]
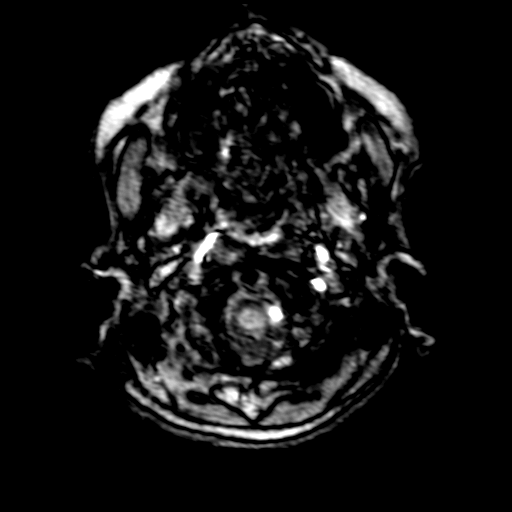
[im 23/130]
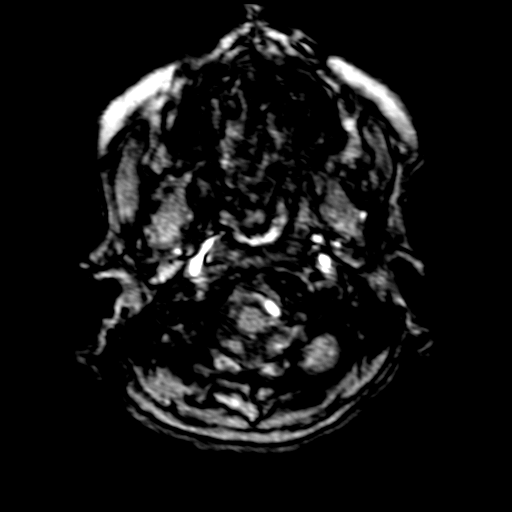
[im 40/130]
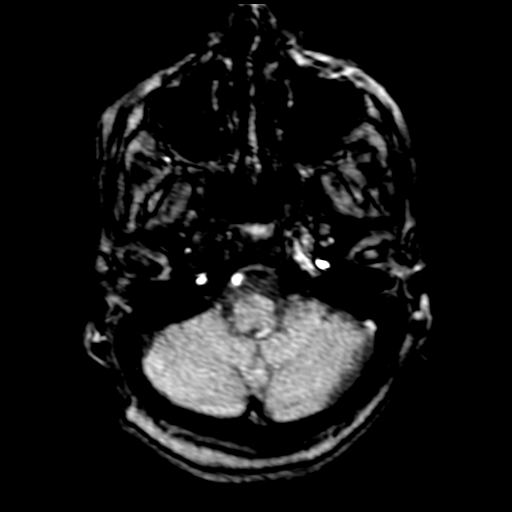
[im 57/130]
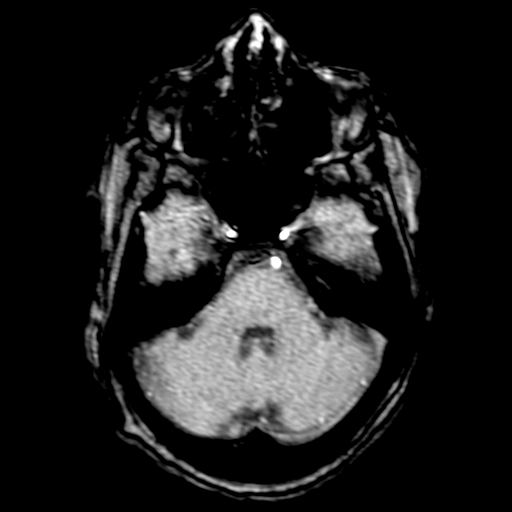
[im 65/130]
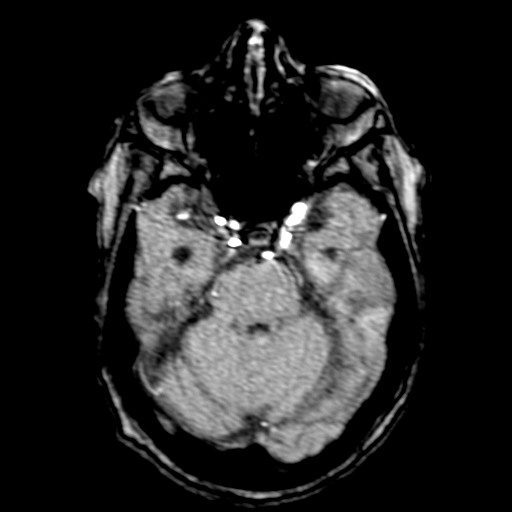
[im 73/130]
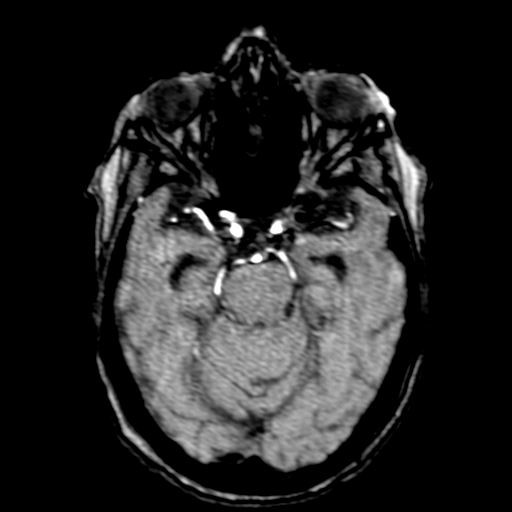
[im 90/130]
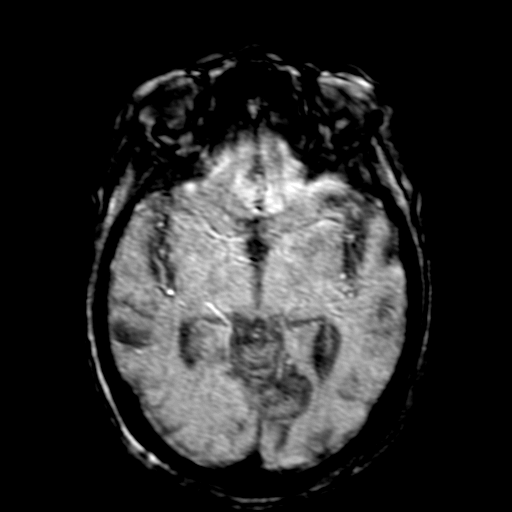
[im 107/130]
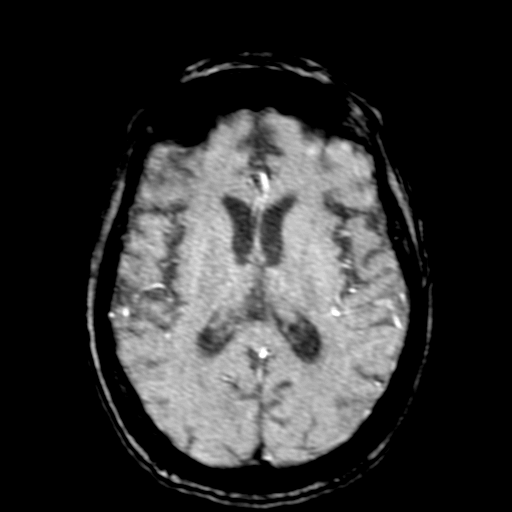
[im 110/130]
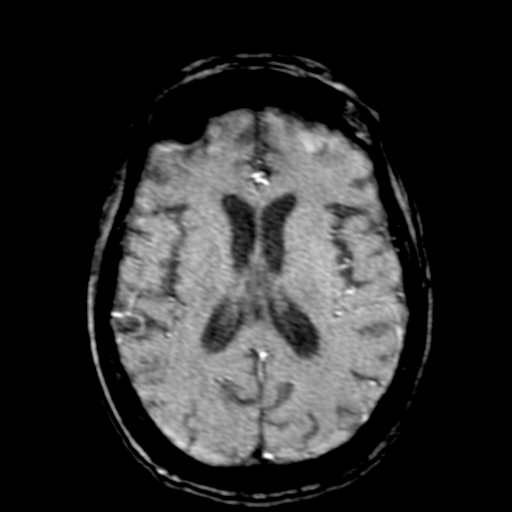
[im 124/130]
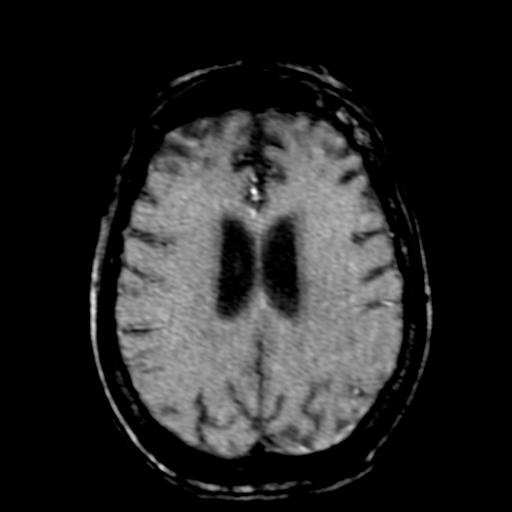

[Series 16: 3d cow_mip_tra · axial · 97.5mm · 0.41mm/px · 1 of 1 slices shown]
[im 1/1]
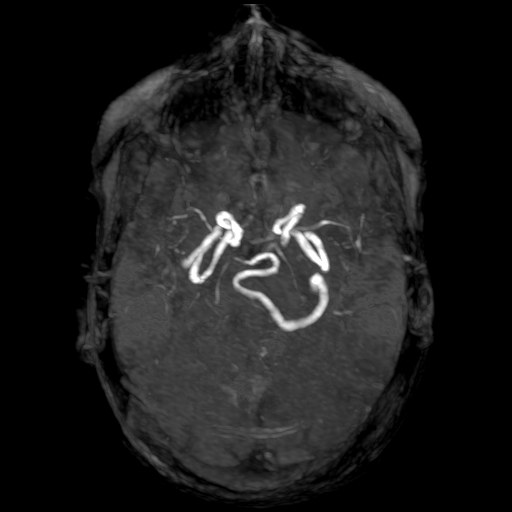

[18 of 48 positions shown; findings below may reference images not displayed]

FINDINGS: The examination is significantly motion degraded. This limits
evaluation for stenoses and for small aneurysms. This also precludes
adequate evaluation for vasospasm.

The intracranial internal carotid arteries are patent with
atherosclerotic irregularity. Apparent moderate/severe stenosis
within the cavernous left ICA may be accentuated by vessel
tortuosity at this site. Apparent moderate focal stenosis within the
cavernous right ICA.

The right middle cerebral artery is patent. Mild/moderate focal
stenosis within the distal M1 right MCA may reflect atherosclerotic
narrowing or vasospasm. Motion degradation precludes adequate
evaluation of the M2 right MCA branch vessels.

The M1 left middle cerebral artery is patent without appreciable
stenosis. Motion degradation precludes adequate evaluation of the M2
left MCA branch vessels.

Motion degradation precludes adequate evaluation of the anterior
cerebral arteries.

No definite intracranial aneurysm is identified.

Significant atherosclerotic irregularity of the non-dominant
intracranial right vertebral artery which likely terminates
predominantly as the right PICA. The dominant intracranial left
vertebral artery is patent without significant stenosis, as is the
basilar artery. Flow related signal is present within the proximal
posterior cerebral arteries bilaterally.
IMPRESSION: 1. Significantly motion degraded examination. This limits evaluation
for stenoses and for small aneurysms. This also precludes adequate
evaluation for vasospasm.
2. Atherosclerotic irregularity of the bilateral intracranial
internal carotid arteries. Apparent moderate/severe stenosis within
the cavernous left ICA may be accentuated by vessel tortuosity at
this site. Apparent moderate focal stenosis within the cavernous
right ICA.
3. Mild/moderate focal stenosis within the distal M1 right MCA,
which may reflect atherosclerotic narrowing or vasospasm.
4. Motion degradation precludes adequate evaluation of the bilateral
M2 MCA branch vessels and of the bilateral anterior cerebral
arteries.
5. Significant atherosclerotic irregularity of the non dominant
intracranial right vertebral artery, which likely terminates
predominantly as the right PICA.
6. No definite intracranial aneurysm is identified.

## 2020-10-27 IMAGING — CT CT CERVICAL SPINE W/O CM
3 of 4 series · 12 of 35 positions shown, 14 images · non-contrast
Comparison: CT cervical spine [DATE].

CLINICAL DATA: Significant neck pain with movement.  Recent fall.

EXAM:
CT CERVICAL SPINE WITHOUT CONTRAST
TECHNIQUE: Multidetector CT imaging of the cervical spine was performed without
intravenous contrast. Multiplanar CT image reconstructions were also
generated.

[Series 4: orthogonal axials · axial · 0.21mm/px · z∈[+1118,+1216]mm · 4 of 103 slices shown, 5 images]
[im 18/103  soft-tissue]
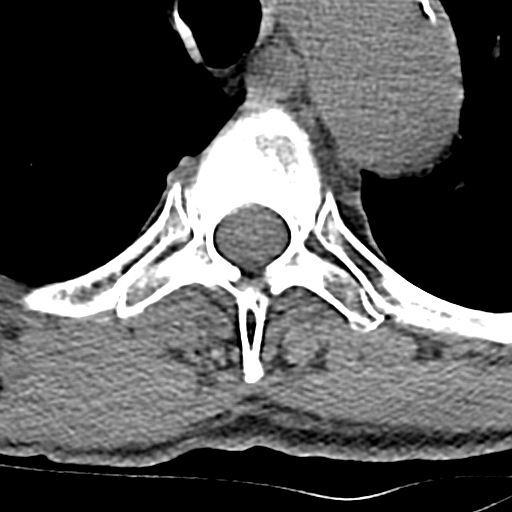
[im 18/103  bone]
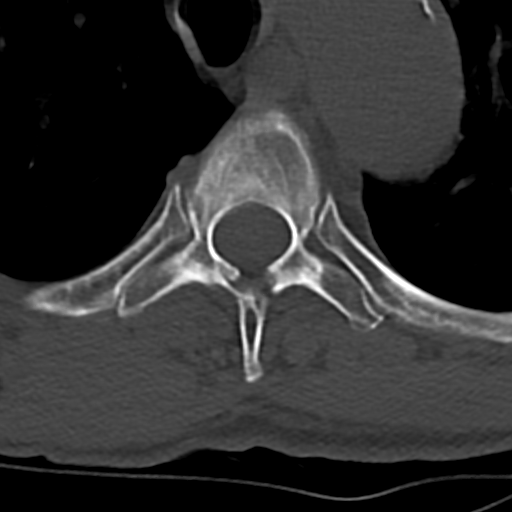
[im 35/103  bone]
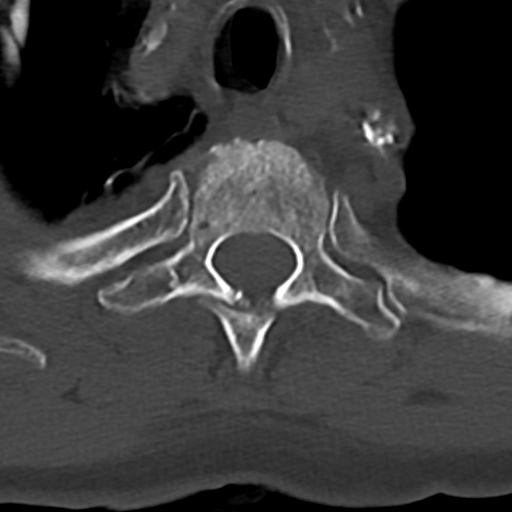
[im 69/103  bone]
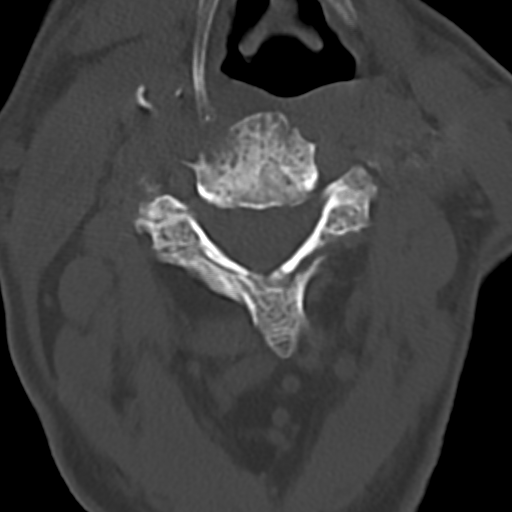
[im 86/103  bone]
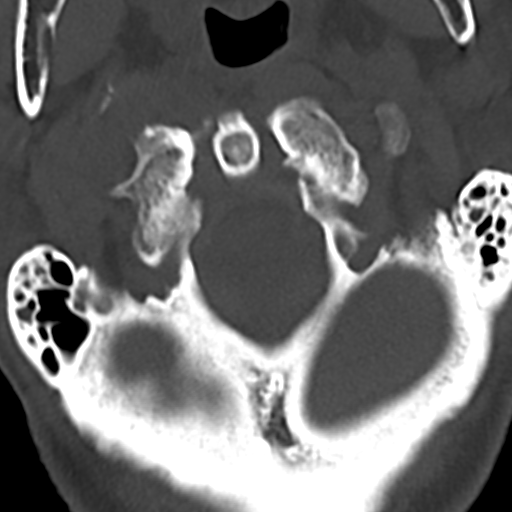

[Series 9: sag bone · sagittal · 0.29mm/px · 5 of 61 slices shown, 6 images]
[im 21/61  bone]
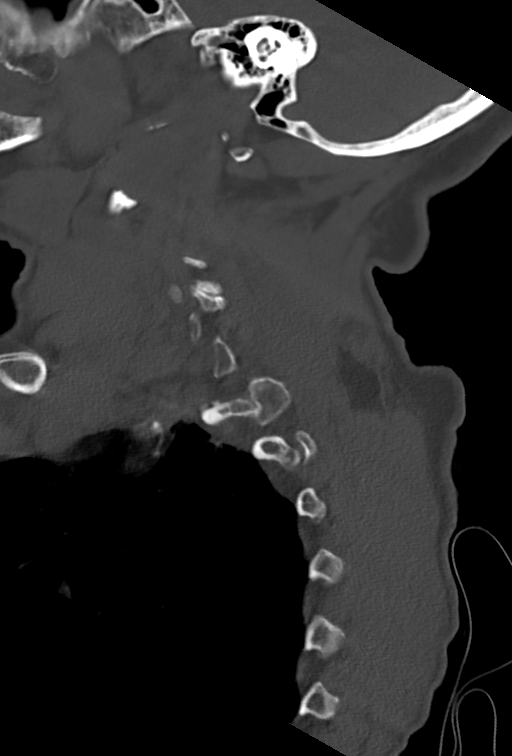
[im 26/61  bone]
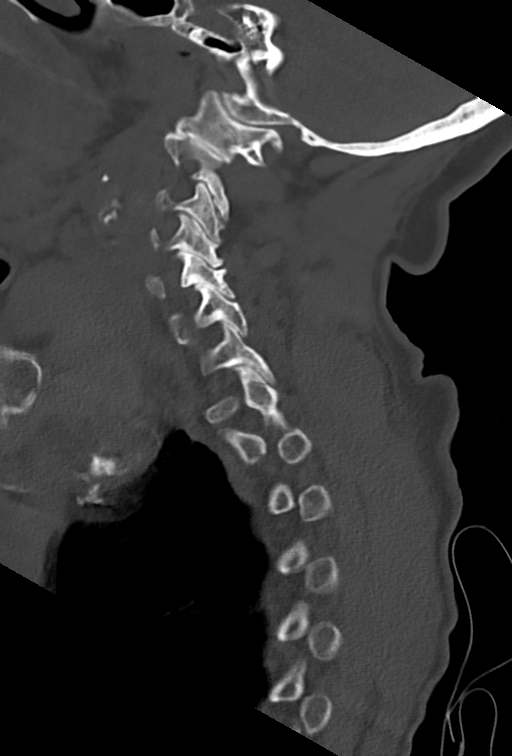
[im 31/61  soft-tissue]
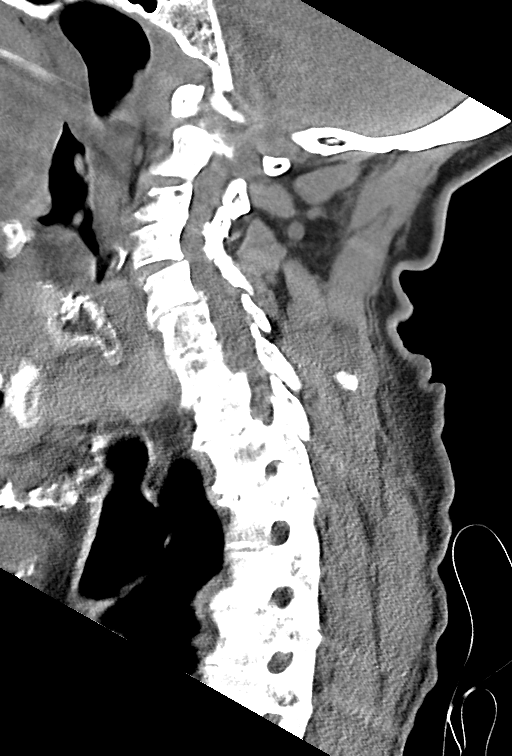
[im 31/61  bone]
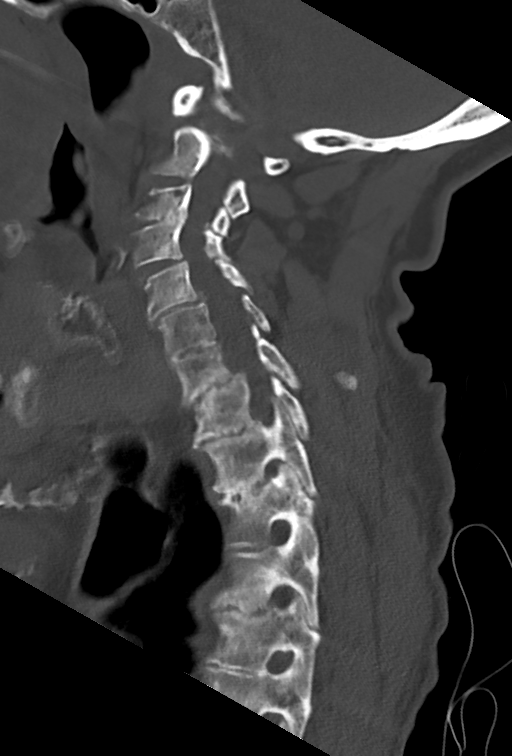
[im 36/61  bone]
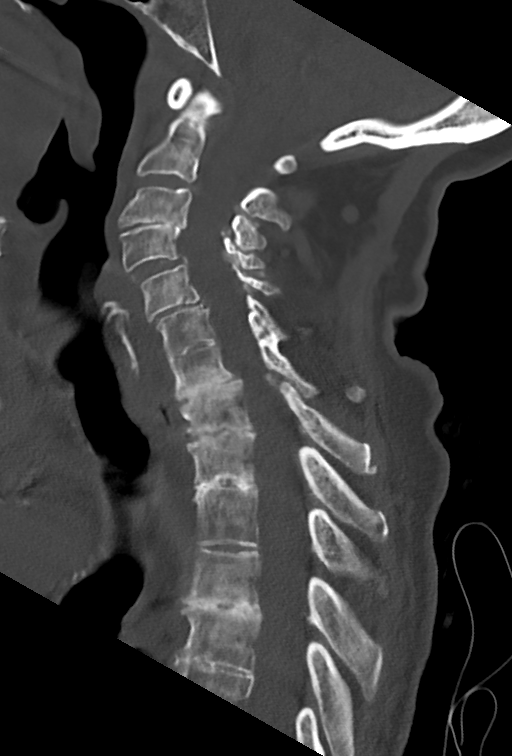
[im 41/61  bone]
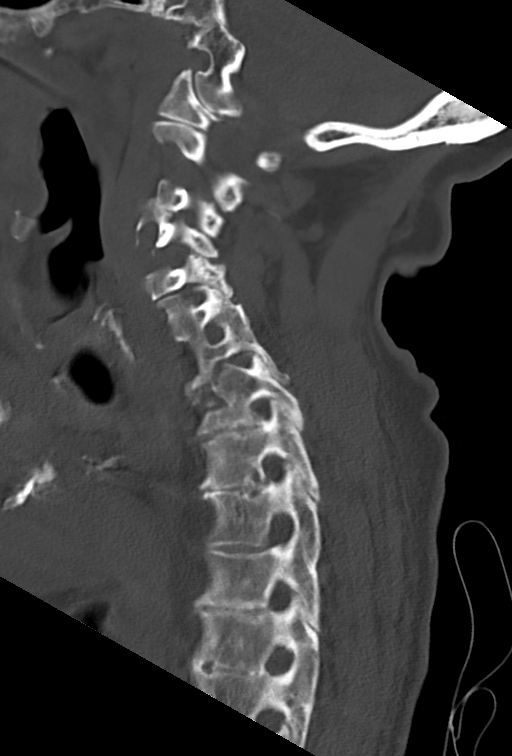

[Series 10: cor bone · coronal · 0.28mm/px · 3 of 69 slices shown]
[im 21/69  bone]
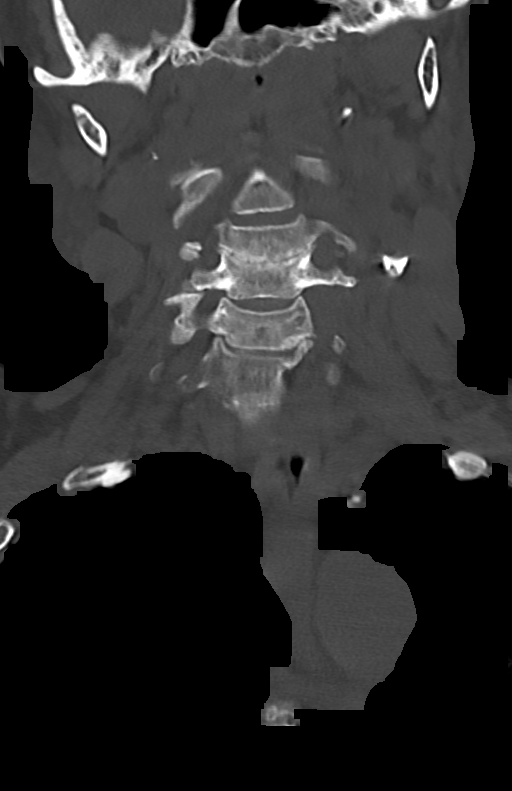
[im 30/69  bone]
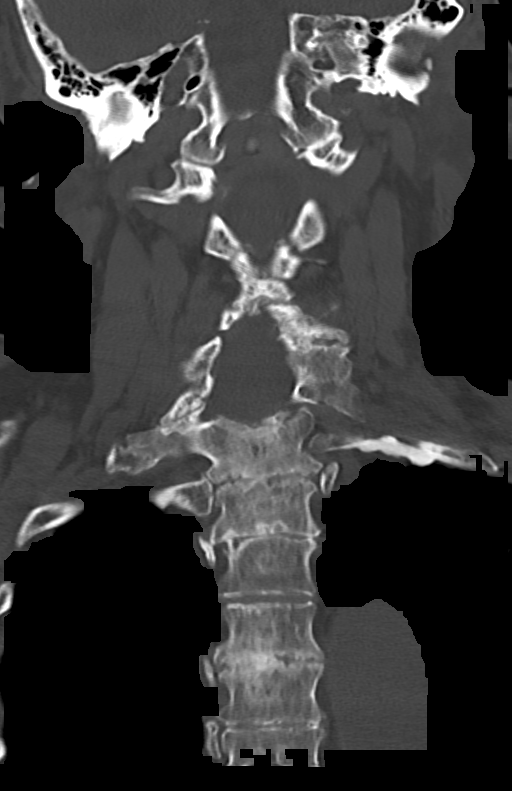
[im 39/69  bone]
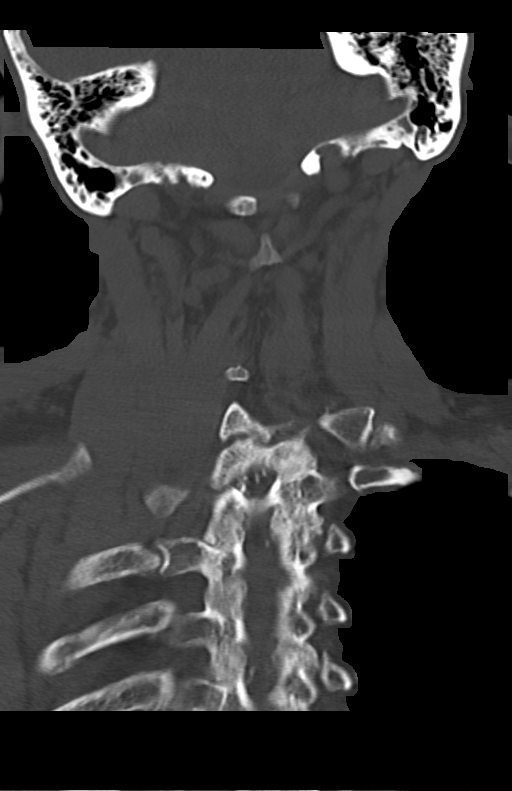

[12 of 35 positions shown; findings below may reference images not displayed]

FINDINGS: Alignment: Stable mild anterolisthesis at C4-5 and C5-6 secondary to
facet disease.

Skull base and vertebrae: No evidence of acute cervical spine
fracture or traumatic subluxation. Probable chronic interbody and
left interfacetal fusion at C6-7. Prominent degenerative endplate
sclerosis at C7-T1, T1-2, T2-3 and T4-5. The posterior arch of C1 is
incomplete.

Soft tissues and spinal canal: No prevertebral fluid or swelling. No
visible canal hematoma.

Disc levels: Multilevel spondylosis with posterior osteophytes and
facet hypertrophy, similar to previous CT. No large disc herniation
identified.

Upper chest: Atherosclerosis of the aorta and great vessels.
Emphysematous changes are present at both lung apices. Chronic
periosteal thickening of the 1st ribs bilaterally.

Other: None.
IMPRESSION: 1. No evidence of acute cervical spine fracture or traumatic
subluxation.
2. Multilevel cervicothoracic spondylosis, similar to previous
studies.

## 2020-10-27 IMAGING — CT CT HEAD W/O CM
4 series · 16 of 47 positions shown, 18 images · non-contrast
Comparison: Brain MRI 09/18/2019 and head CT 09/17/2019.

CLINICAL DATA: 78-year-old female with suspected trace subarachnoid
hemorrhage in the left sylvian fissure on MRI yesterday. According
to the EMR the patient did have a recent fall.

EXAM:
CT HEAD WITHOUT CONTRAST
TECHNIQUE: Contiguous axial images were obtained from the base of the skull
through the vertex without intravenous contrast.

[Series 3: head without · axial · non-contrast · 0.42mm/px · z∈[-103,+17]mm · 7 of 32 slices shown, 9 images]
[im 4/32  brain]
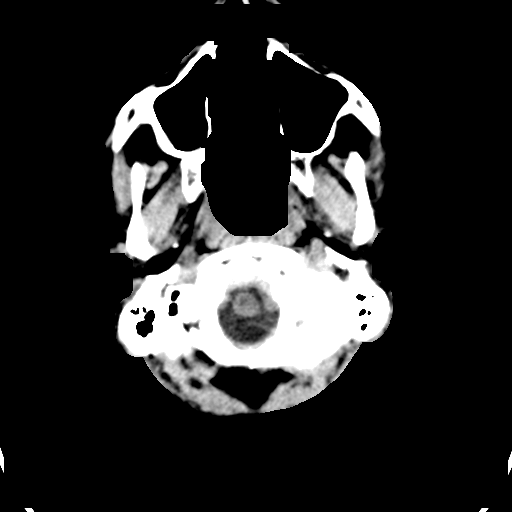
[im 4/32  bone]
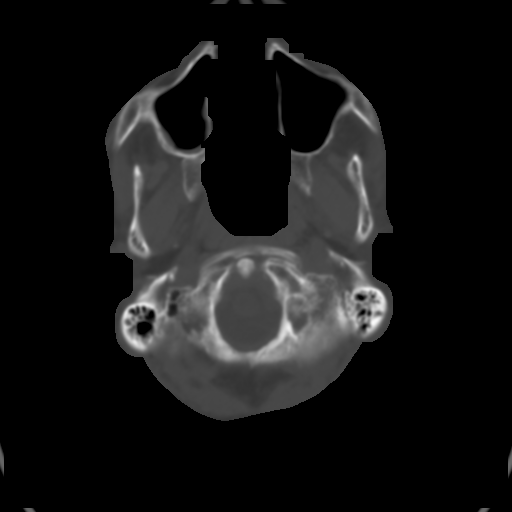
[im 8/32  brain]
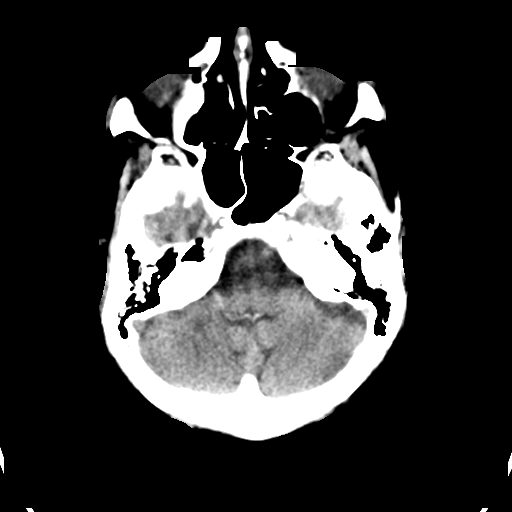
[im 12/32  brain]
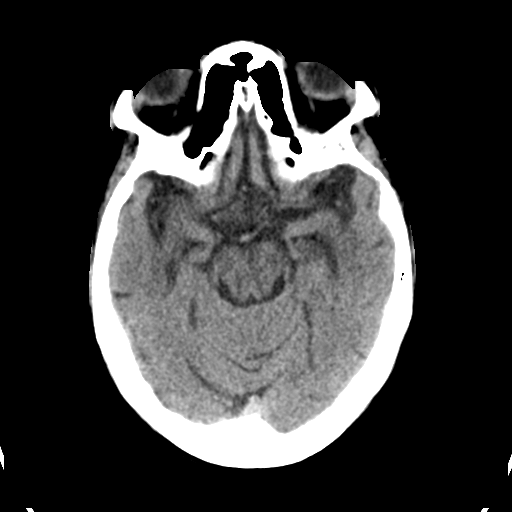
[im 16/32  brain]
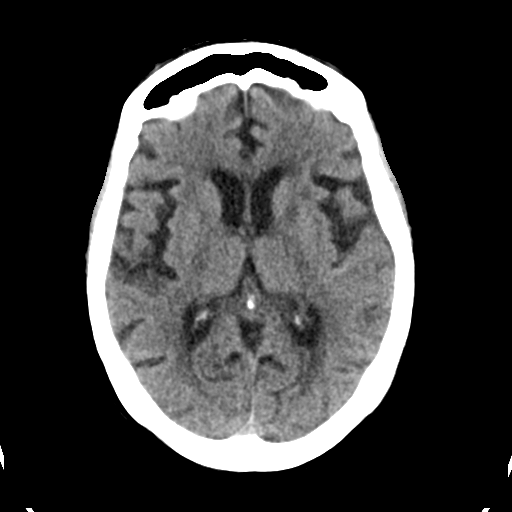
[im 20/32  brain]
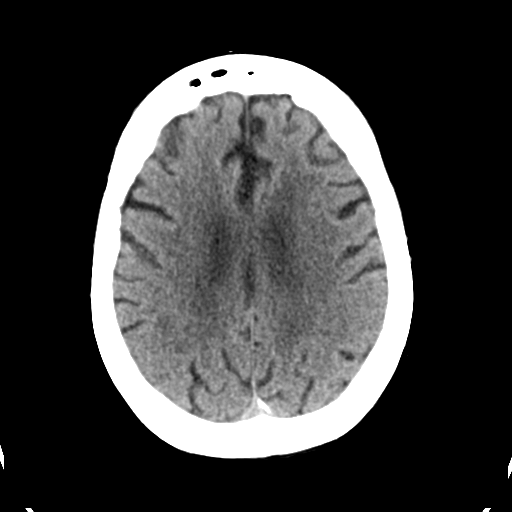
[im 20/32  bone]
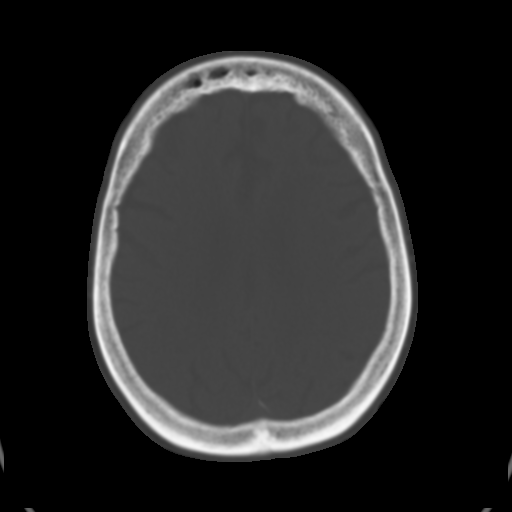
[im 24/32  brain]
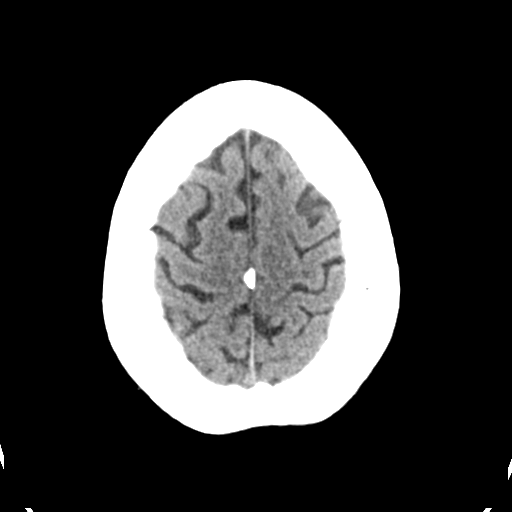
[im 28/32  brain]
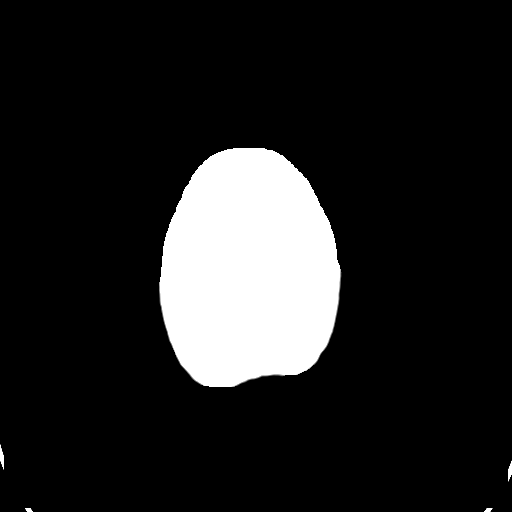

[Series 4: head bone · axial · 0.42mm/px · z∈[-104,-72]mm · 3 of 78 slices shown]
[im 8/78  bone]
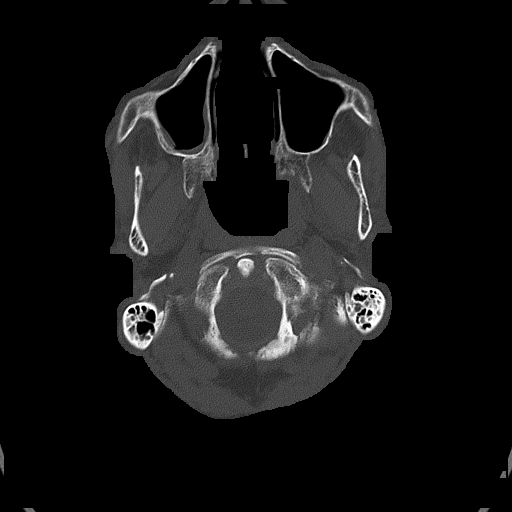
[im 16/78  bone]
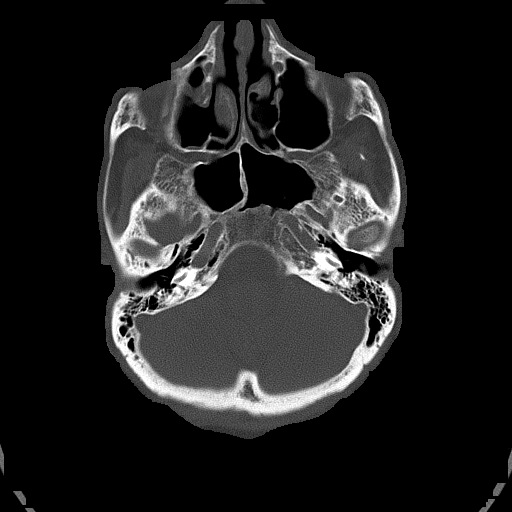
[im 24/78  bone]
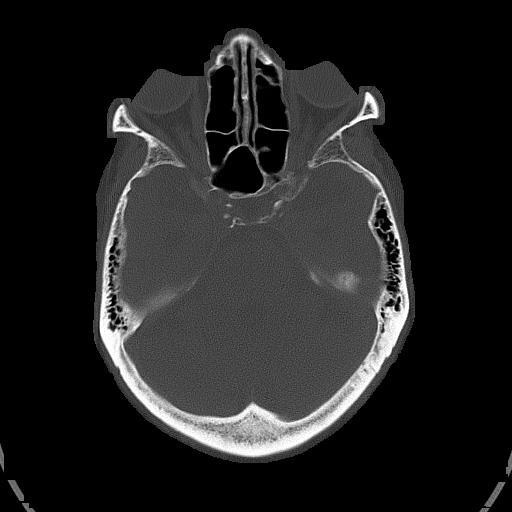

[Series 5: head without cor · coronal · non-contrast · 0.32mm/px · 3 of 67 slices shown]
[im 23/67  brain]
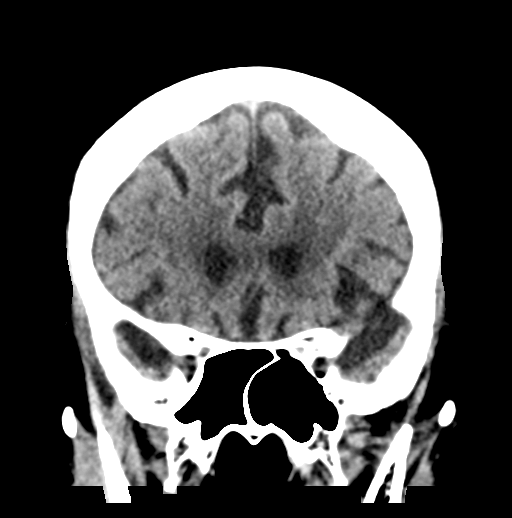
[im 30/67  brain]
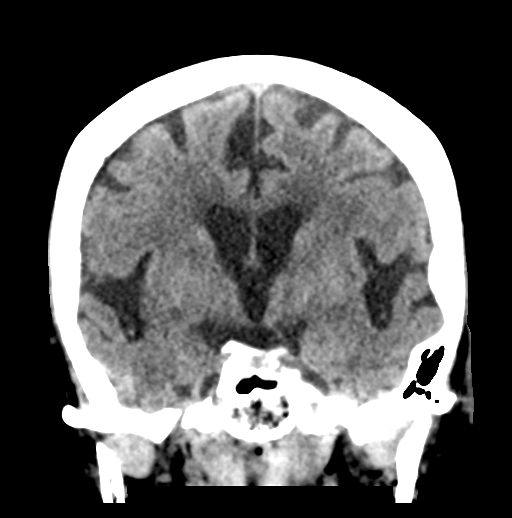
[im 37/67  brain]
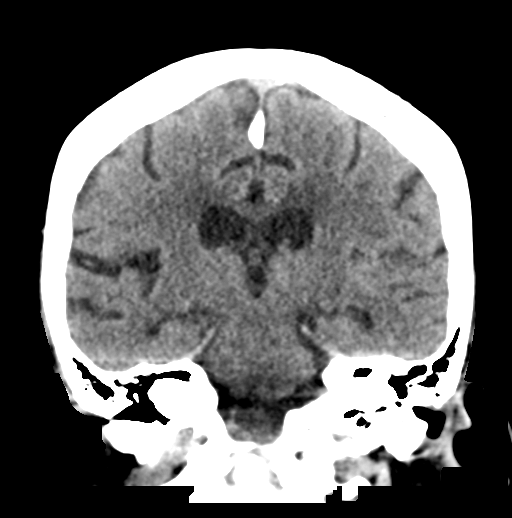

[Series 6: head without sag · sagittal · non-contrast · 0.32mm/px · 3 of 51 slices shown]
[im 17/51  brain]
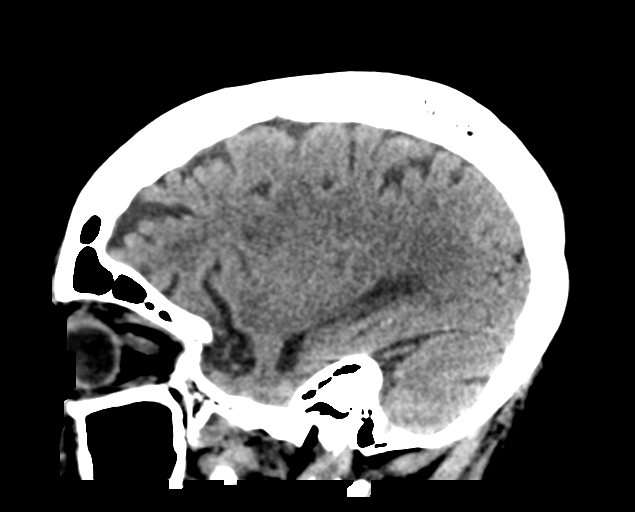
[im 26/51  brain]
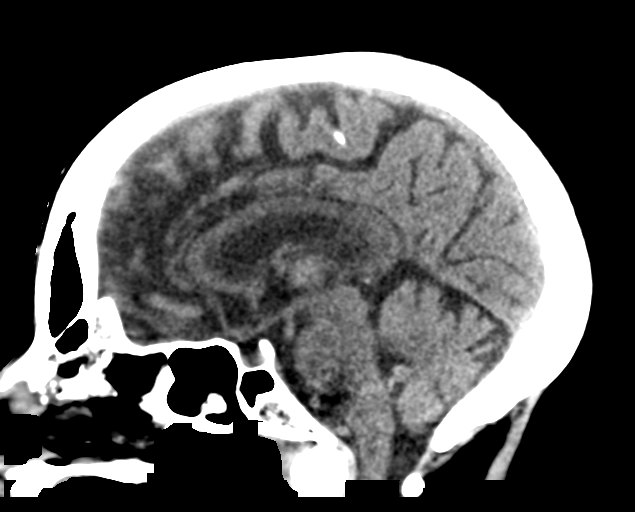
[im 34/51  brain]
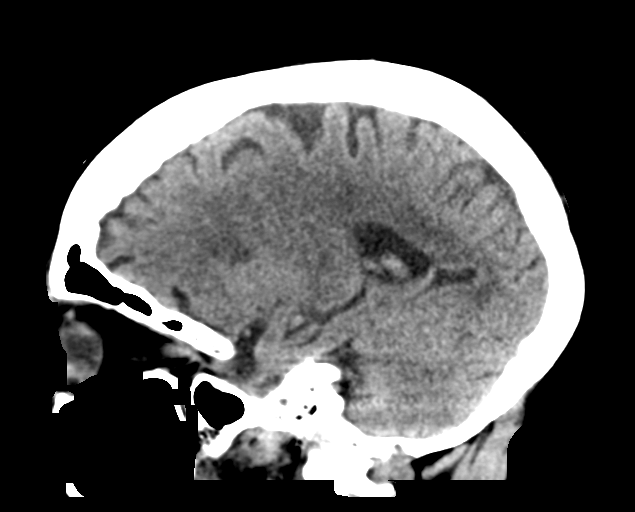

[16 of 47 positions shown; findings below may reference images not displayed]

FINDINGS: Brain: Trace hyperdensity along the posterior left sylvian fissure
does appear regressed from the head CT yesterday (compare sagittal
image 37 yesterday to image 41 today). No intraventricular
hemorrhage is identified. No other subarachnoid blood identified.

Stable gray-white matter differentiation throughout the brain. White
matter hypodensity including deep white matter capsule involvement
greater on the left. No midline shift, ventriculomegaly, mass
effect, evidence of mass lesion, or evidence of cortically based
acute infarction.

Vascular: Calcified atherosclerosis at the skull base. No suspicious
intracranial vascular hyperdensity.

Skull: No acute osseous abnormality identified.

Sinuses/Orbits: Stable, evidence of chronic right maxillary
sinusitis. Tympanic cavities and mastoids remain clear.

Other: No acute orbit or scalp soft tissue finding.
IMPRESSION: 1. Continued suspicion of trace subarachnoid hemorrhage in the
posterior left sylvian fissure, which seems regressed from the
non-contrast head CT 09/17/2019. This is probably sequelae of recent
fall.
2. No new intracranial abnormality identified. Chronic small vessel
disease.

## 2020-11-07 ENCOUNTER — Other Ambulatory Visit: Payer: Self-pay

## 2020-11-07 ENCOUNTER — Encounter: Payer: Self-pay | Admitting: Internal Medicine

## 2020-11-07 ENCOUNTER — Ambulatory Visit (INDEPENDENT_AMBULATORY_CARE_PROVIDER_SITE_OTHER): Payer: Medicare HMO | Admitting: Internal Medicine

## 2020-11-07 DIAGNOSIS — J449 Chronic obstructive pulmonary disease, unspecified: Secondary | ICD-10-CM

## 2020-11-07 DIAGNOSIS — F17210 Nicotine dependence, cigarettes, uncomplicated: Secondary | ICD-10-CM

## 2020-11-07 DIAGNOSIS — I10 Essential (primary) hypertension: Secondary | ICD-10-CM | POA: Diagnosis not present

## 2020-11-07 MED ORDER — BREZTRI AEROSPHERE 160-9-4.8 MCG/ACT IN AERO: 2.0000 | INHALATION_SPRAY | Freq: Two times a day (BID) | RESPIRATORY_TRACT | 0 refills | Status: DC

## 2020-11-07 NOTE — Patient Instructions (Addendum)
No change in medications  Breztri  Take 2 puffs first thing in am and then another 2 puffs about 12 hours later.  - 2 at home and 2 samples given today   The key is to stop smoking completely before smoking completely stops you!  Make sure you check your oxygen saturation  at your highest level of activity  to be sure it stays over 90% and adjust  02 flow upward to maintain this level if needed but remember to turn it back to previous settings when you stop (to conserve your supply).    Please schedule a follow up office visit in 4 weeks, sooner if needed  with all medications /inhalers/ solutions in hand so we can verify exactly what you are taking. This includes all medications from all doctors and over the counters

## 2020-11-07 NOTE — Progress Notes (Signed)
Alyssa [name], female    DOB: [birth year]     MRN: [id number]   Brief patient profile:  52 yobf active smoker  With onset doe in 08X  With diastolic dysfunction and CRI assoc with chronic met acidosis and no spirometry on record then admit:     Admit date: 07/06/2020 Discharge date: 07/10/2020  Admitted From: Home Discharge disposition: Home   Code Status: Full Code  Diet Recommendation: Cardiac diet  Discharge Diagnosis:   Principal Problem:   Metabolic acidosis Active Problems:   Shortness of breath   PVD (peripheral vascular disease) (HCC)   CKD (chronic kidney disease) stage 4, GFR 15-29 ml/min (HCC)   Anemia   Protein-calorie malnutrition, severe (HCC)   Essential hypertension   Abdominal pain   Elevated INR   History of Present Illness / Brief narrative:  Patient is a 80 year old female history of CKD stage IV, hypertension, PAD who was admitted with COVID-19 and acute kidney injury last month. Since discharge patient has been experiencing progressively worsening shortness of breath.  9/19, she had shortness of breath, fever chills and rigors EMS was called.   EMS noted a temperature of 100.8.  In the ED, patient had a temperature of 99, tachycardic to 120, respiratory 25, blood pressure 160/102 Labs with lactate level of 3.2, serum bicarbonate level 9, anion gap 15 Chest x-ray did not show any acute infiltrates. Blood gas showed normal pH of 7.4, PCO2 low at 19, serum bicarb low at 12 Urinalysis showed large leukocyte esterase, negative nitrates, 6-10 WBCs, negative for blood.  Patient was admitted to hospitalist service for further evaluation management.  Subjective:  Seen and examined this morning. Pleasant elderly [name].  Lying down in bed.  Not in distress. No fever last 48 hours.  Blood pressure in 160s.  Breathing comfortably on room air.  Feels ready to go home today.  Hospital Course:  Acute on chronic metabolic  acidosis with respiratory compensation -Patient has chronic metabolic acidosis and was supposed to be on sodium bicarbonate tablet which apparently she was not compliant to. -She presented with a low bicarbonate level of 9.  Blood gas showed PCO2 level at 19. -Patient probably was hyperventilating to generate respiratory compensation for acute worsening of metabolic acidosis. -Pulmonary embolism was ruled out by a negative VQ scan. -Nephrology consult appreciated.  She was started on bicarbonate drip and subsequently switched to resume sodium bicarbonate tablets 1300 mg twice daily. -Continue to follow-up with nephrology as an outpatient  Sepsis ruled out -Initially suspected of sepsis because of respiratory distress. -Chest x-ray and CT imaging did not show any new infiltrates in lungs. -Sepsis ruled out now.  Antibiotics stopped.  Falls/tremors -Patient reports tremors and intermittent falls at home. -9/22, MRI brain was obtained which did not show any evidence of acute intracranial abnormality.  Chronic microvascular ischemic changes were noted. -PT evaluation was obtained.  Patient is functioning as ambulating independently. -Previous hospitalist switched her from Coreg to propanolol which we will continue at discharge.   Hypertension -Currently on clonidine, amlodipine and propanolol.  Blood pressure remains elevated to 160s today.  I would increase amlodipine from 2.5 mg to 5 mg daily at discharge.  CKD stage IV -Creatinine fluctuating but remains at baseline. -Follow-up with nephrology as an outpatient. 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 -Continue iron supplement at home. Recent Labs (within last 365 days)              Recent Labs    06/03/20 0413 06/03/20 1421 06/04/20 0251 06/05/20 0337 06/06/20 0628 06/07/20 0651 06/09/20 1604 07/06/20 1538 07/06/20 2147 07/10/20 0137  HGB 8.6* 8.2* 7.6* 7.1* 6.7* 9.2* 9.6* 9.1* 7.8* 8.5*     COVID-19 pneumonia 06/07/20 transiently needed 02 inpt only/ had been vaccinated prior -No residual symptoms.      History of Present Illness  09/25/2020  Pulmonary/ 1st office eval/Alyssa [name]  Chief Complaint  Patient presents with  . Consult    Shortness of breath with activity and sometimes when resting  Dyspnea:  25 ft with rollator for a year  Cough: none  Sleep: props up one one pillow/ bed is flat SABA use: couple times a day seems to help rec Stop propranolol and start bisoprolol 10 mg daily  Plan A = Automatic = Always=    Breztri Take 2 puffs first thing in am and then another 2 puffs about 12 hours later.  Work on inhaler technique: Plan B = Backup (to supplement plan A, not to replace it) Only use your albuterol inhaler as a rescue medication The key is to stop smoking completely before smoking completely stops you! Please schedule a follow up office visit in 4 weeks, sooner if needed with pfts     11/07/2020  f/u ov/Alyssa [name] re: copd clinical dx/ vaccinated and boosted Chief Complaint  Patient presents with  . Follow-up    Breathing has improved slightly since the last visit. She is using her albuterol inhaler 3 x per wk on average.    COPD  GOLD ? still smoking  Active smoker -  09/25/2020  After extensive coaching inhaler device,  effectiveness =    75% from baseline 25 % > breztri trial  Essential hypertension Changed propranolol to bisoprolol 09/25/2020 > did not do  Dyspnea:  50 ft with rollator  Cough: none  Sleeping: bed is flat/ one pillow  SABA use: as above  02: none and does not check at home    No obvious day to day or daytime variability  or assoc excess/ purulent sputum or mucus plugs or hemoptysis or cp or chest tightness, subjective wheeze or overt sinus or hb symptoms.   Sleeping  without nocturnal  or early am exacerbation  of respiratory  c/o's or need for noct saba. Also denies any obvious fluctuation of symptoms with weather or environmental changes or other aggravating or alleviating factors except as outlined above   No unusual exposure hx or h/o childhood pna/ asthma or knowledge of premature birth.  Current Allergies, Complete Past Medical History, Past Surgical History, Family History, and Social History were reviewed in [employer] record.  ROS  The following are not active complaints unless bolded Hoarseness, sore throat, dysphagia, dental problems, itching, sneezing,  nasal congestion or discharge of excess mucus or purulent secretions, ear ache,   fever, chills, sweats, unintended wt loss or wt gain, classically pleuritic or exertional cp,  orthopnea pnd or arm/hand swelling  or leg swelling, presyncope, palpitations, abdominal pain, anorexia, nausea, vomiting, diarrhea  or change in bowel habits or change  in bladder habits, change in stools or change in urine, dysuria, hematuria,  rash, arthralgias, visual complaints, headache, numbness, weakness or ataxia or problems with walking or coordination,  change in mood or  memory.        Current Meds - - NOTE:   Unable to verify as accurately reflecting what pt takes     Medication Sig  . acetaminophen (TYLENOL) 325 MG tablet Take 2 tablets (650 mg total) by mouth every 6 (six) hours as needed for mild pain, moderate pain or fever (or Fever >/= 101).  Marland Kitchen albuterol (VENTOLIN HFA) 108 (90 Base) MCG/ACT inhaler Inhale 2 puffs into the lungs every 4 (four) hours as needed for wheezing or shortness of breath.   Marland Kitchen amLODipine (NORVASC) 5 MG tablet Take 7.5 mg by mouth at bedtime.  Marland Kitchen aspirin 81 MG EC tablet Take 1 tablet (81 mg total) by mouth daily.  .  Budeson-Glycopyrrol-Formoterol (BREZTRI AEROSPHERE) 160-9-4.8 MCG/ACT AERO Inhale 2 puffs into the lungs 2 (two) times daily.  . carvedilol (COREG) 12.5 MG tablet Take 12.5 mg by mouth 2 (two) times daily.  . cloNIDine (CATAPRES) 0.2 MG tablet Take 0.2 mg by mouth at bedtime.  . clopidogrel (PLAVIX) 75 MG tablet Take 1 tablet (75 mg total) by mouth daily.  . diclofenac Sodium (VOLTAREN) 1 % GEL Apply 2 g topically 4 (four) times daily as needed (pain).   Marland Kitchen dicyclomine (BENTYL) 20 MG tablet Take 20 mg by mouth 3 (three) times daily.  Marland Kitchen donepezil (ARICEPT) 5 MG tablet Take 5 mg by mouth daily.  . DULoxetine (CYMBALTA) 30 MG capsule Take 30 mg by mouth daily.  . feeding supplement, ENSURE ENLIVE, (ENSURE ENLIVE) LIQD Take 237 mLs by mouth 2 (two) times daily between meals.  . Ferrous Sulfate (IRON HIGH-POTENCY) 325 MG TABS Take 325 mg by mouth daily with breakfast.  . gabapentin (NEURONTIN) 100 MG capsule Take 100 mg by mouth 3 (three) times daily.  . hydrOXYzine (ATARAX/VISTARIL) 25 MG tablet Take 25 mg by mouth at bedtime as needed (sleep).   . magnesium oxide (MAG-OX) 400 MG tablet Take 400 mg by mouth 2 (two) times daily.  . megestrol (MEGACE) 40 MG tablet Take 40 mg by mouth 2 (two) times daily.  . methocarbamol (ROBAXIN) 500 MG tablet Take 500 mg by mouth 3 (three) times daily as needed (back spasms).   . Multiple Vitamin (MULTIVITAMIN WITH MINERALS) TABS tablet Take 1 tablet by mouth daily.  . nitroGLYCERIN (NITROSTAT) 0.4 MG SL tablet Place 1 tablet (0.4 mg total) under the tongue every 5 (five) minutes as needed for chest pain.  Marland Kitchen ondansetron (ZOFRAN ODT) 4 MG disintegrating tablet Take 1 tablet (4 mg total) by mouth every 4 (four) hours as needed for nausea or vomiting.  . pantoprazole (PROTONIX) 40 MG tablet Take 1 tablet (40 mg total) by mouth daily.  . rosuvastatin (CRESTOR) 40 MG tablet Take 1 tablet (40 mg total) by mouth daily.  . sodium bicarbonate 650 MG tablet Take 2 tablets  (1,300 mg total) by mouth 2 (two) times daily. (Patient taking differently: Take 650 mg by mouth daily.)  . Vitamin D, Ergocalciferol, (DRISDOL) 1.25 MG (50000 UNIT) CAPS capsule Take 50,000 Units by mouth every Monday.             Past Medical History:  Diagnosis Date  . Acute respiratory failure with hypoxia ([place]) 11/01/2019  . Anxiety   . Arthritis   . Chest pain   . Chronic kidney disease   .  Dyspnea   . Elevated troponin 12/13/2018  . Headache(784.0)   . Hyperlipidemia   . Hypertension   . Joint pain   . Leg pain   . Peripheral neuropathy   . Peripheral vascular disease (HCC)       Objective:     Wt Readings from Last 3 Encounters:  11/07/20 103 lb (46.7 kg)  10/17/20 99 lb 6.4 oz (45.1 kg)  10/02/20 100 lb 12 oz (45.7 kg)      Vital signs reviewed  11/07/2020  - Note at rest 02 sats  100% on RA   General appearance:    Elderly bf easily confused with details of care     HEENT : pt wearing mask not removed for exam due to covid -19 concerns.    NECK :  without JVD/Nodes/TM/ nl carotid upstrokes bilaterally   LUNGS: no acc muscle use,  Mod barrel  contour chest wall with bilateral  Distant bs s audible wheeze and  without cough on insp or exp maneuvers and mod  Hyperresonant  to  percussion bilaterally     CV:  RRR  no s3 or murmur or increase in P2, and no edema   ABD:  soft and nontender with pos mid insp Hoover's  in the supine position. No bruits or organomegaly appreciated, bowel sounds nl  MS:     ext warm without deformities, calf tenderness, cyanosis or clubbing No obvious joint restrictions   SKIN: warm and dry without lesions    NEURO:  alert, approp, nl sensorium with  no motor or cerebellar deficits apparent.           Assessment

## 2020-11-08 ENCOUNTER — Encounter: Payer: Self-pay | Admitting: Internal Medicine

## 2020-11-08 NOTE — Assessment & Plan Note (Signed)
Active smoker -  09/25/2020   breztri trial  - 11/07/2020 says has 2 full breztri's at home but brought in empty one - 11/07/2020  After extensive coaching inhaler device,  effectiveness =    75% (short ti) > given 2 more breztri's and f/u in 4 weeks with all meds /inhalers/solutions in hand   Clinically  Group D in terms of symptom/risk and laba/lama/ICS  therefore appropriate rx at this point >>>  Continue breztri trial plus prn saba  Re saba: I spent extra time with pt today reviewing appropriate use of albuterol for prn use on exertion with the following points: 1) saba is for relief of sob that does not improve by walking a slower pace or resting but rather if the pt does not improve after trying this first. 2) If the pt is convinced, as many are, that saba helps recover from activity faster then it's easy to tell if this is the case by re-challenging : ie stop, take the inhaler, then p 5 minutes try the exact same activity (intensity of workload) that just caused the symptoms and see if they are substantially diminished or not after saba 3) if there is an activity that reproducibly causes the symptoms, try the saba 15 min before the activity on alternate days   If in fact the saba really does help, then fine to continue to use it prn but advised may need to look closer at the maintenance regimen being used to achieve better control of airways disease with exertion.

## 2020-11-08 NOTE — Assessment & Plan Note (Signed)
Appears now to be on coreg is better choice than propranolol but still not as selective as bisoprolol, my prior rec.   Since medication reconciliation is such a challenge here, will leave this alone for now.

## 2020-11-08 NOTE — Assessment & Plan Note (Addendum)
Counseled re importance of smoking cessation but did not meet time criteria for separate billing            Each maintenance medication was reviewed in detail including emphasizing most importantly the difference between maintenance and prns and under what circumstances the prns are to be triggered using an action plan format where appropriate.  Total time for H and P, chart review, counseling, reviewing hfa device(s) and generating customized AVS unique to this office visit / same day charting = 24 min       .

## 2020-11-20 ENCOUNTER — Encounter: Payer: Self-pay | Admitting: Neurology

## 2020-11-20 ENCOUNTER — Ambulatory Visit (INDEPENDENT_AMBULATORY_CARE_PROVIDER_SITE_OTHER): Payer: Medicare HMO | Admitting: Neurology

## 2020-11-20 ENCOUNTER — Other Ambulatory Visit: Payer: Self-pay

## 2020-11-20 VITALS — BP 161/91 | HR 83 | Ht 62.0 in | Wt 107.8 lb

## 2020-11-20 DIAGNOSIS — R251 Tremor, unspecified: Secondary | ICD-10-CM | POA: Diagnosis not present

## 2020-11-20 DIAGNOSIS — F0390 Unspecified dementia without behavioral disturbance: Secondary | ICD-10-CM

## 2020-11-20 DIAGNOSIS — F03A0 Unspecified dementia, mild, without behavioral disturbance, psychotic disturbance, mood disturbance, and anxiety: Secondary | ICD-10-CM

## 2020-11-20 MED ORDER — DONEPEZIL HCL 5 MG PO TABS: 5.0000 mg | ORAL_TABLET | Freq: Every day | ORAL | 3 refills | Status: DC

## 2020-11-20 MED ORDER — GABAPENTIN 100 MG PO CAPS: ORAL_CAPSULE | ORAL | 3 refills | Status: DC

## 2020-11-20 NOTE — Patient Instructions (Signed)
1. Continue Donepezil 5mg  daily  2. Increase gabapentin 100mg : Take 1 capsule in AM, 1 capsule at noon, 2 capsules at bedtime and see if this helps with tremor  3. Continue 24/7 supervision  4. Follow-up in 6-8 months, call for any changes

## 2020-11-20 NOTE — Progress Notes (Signed)
NEUROLOGY FOLLOW UP OFFICE NOTE  Alyssa [name] [id number] 1940-11-22  HISTORY OF PRESENT ILLNESS: I had the pleasure of seeing Alyssa [name] in follow-up in the neurology clinic on 11/20/2020.  The patient was last seen a year ago for dementia, likely due to Alzheimer's disease, and tremors likely medication-induced. She is again accompanied by her caregiver [name] who helps supplement the history today.  Records and images were personally reviewed where available.  On her last visit, MMSE was 10/30. She is on Donepezil 5mg  daily. Her main concern at that time were tremors, no parkinsonian signs. Depakote was reduced to 250mg  daily on last visit, she is no longer taking this. She has been admitted to the hospital several times since her last visit. She has had episodes of lightheadedness and syncope due to dehydration and anorexia. During one of her admissions, Coreg was switched to Propranolol, then to Bisoprolol in 09/2020, however she continued to take the Coreg. She feels her memory is good. [name] reports she is doing pretty good. He manages her medications, finances. She does not drive. He denies any difficulties following instructions. No wandering behavior, no hallucinations. Sleep is good. She reports paresthesias in both hands and feet. She is on gabapentin for neuropathy, no side effects. She feels tremor is unchanged, L>R.  No falls.   History on Initial Assessment 12/05/2019: This is a 80 year old right-handed woman with a history of hypertension, hyperlipidemia, neuropathy, anxiety, migraines, presenting for evaluation of dementia. Her caregiver [name] is present to provide additional information. She states her memory is "not that good." [name] moved back in with her in 2005. He states she was not on much medications until 2018. He noticed she was getting mixed up with her medications so he started fixing her pillbox then, making sure she gets the right day out. He states she  is good at remembering some things, but gets her days confused. She does not drive. [name] and her son take care of finances. She does not cook. She is able to operate the microwave and TV remote control. She is independent with dressing and bathing. No paranoia or hallucinations, but he notes that mood ramps up when someone is aggravating her.   She has migraines every 1-2 weeks with pain over the left temporal region. She is sensitive to lights and sounds, no nausea/vomiting. She takes Excedrin. No dizziness, diplopia, dysarthria/dysphagia, bowel/bladder dysfunction. She denies any body pain. She has numbness and tingling in her feet. No anosmia. She has been having tremors "all over my body" for a few months, affecting writing and using utensils. She used to fall a lot, but has not had any falls since a year ago. She has a history of syncope, no passing out in the past year. No family history of dementia. She had a concussion from a fall many years ago. She does not drink alcohol. Sleep is good, the temazepam helps. She is on Donepezil 5mg  daily, depakote 250mg  BID, gabapentin 100mg  TID, Buspar, citalopram.   She was in the hospital in 08/2019 for AKI, she had slurred speech and altered mental status. I personally reviewed MRI brain which showed small volume abnormal T1 and FLAIR signal in the posterior left sylvian fissure, moderate chronic microvascular disease. Follow-up head CT showed suspicion of trace SAH in the posterior left sylvian fissure, probably sequalae of recent fall.  PAST MEDICAL HISTORY: Past Medical History:  Diagnosis Date  . Acute respiratory failure with hypoxia ([place]) 11/01/2019  . Anxiety   .  Arthritis   . Chest pain   . Chronic kidney disease   . Dyspnea   . Elevated troponin 12/13/2018  . Headache(784.0)   . Hyperlipidemia   . Hypertension   . Joint pain   . Leg pain   . Peripheral neuropathy   . Peripheral vascular disease ([place])   . Syncope 09/2020     MEDICATIONS: Current Outpatient Medications on File Prior to Visit  Medication Sig Dispense Refill  . acetaminophen (TYLENOL) 325 MG tablet Take 2 tablets (650 mg total) by mouth every 6 (six) hours as needed for mild pain, moderate pain or fever (or Fever >/= 101).    Marland Kitchen albuterol (VENTOLIN HFA) 108 (90 Base) MCG/ACT inhaler Inhale 2 puffs into the lungs every 4 (four) hours as needed for wheezing or shortness of breath.     Marland Kitchen amLODipine (NORVASC) 5 MG tablet Take 7.5 mg by mouth at bedtime.    Marland Kitchen aspirin 81 MG EC tablet Take 1 tablet (81 mg total) by mouth daily. 30 tablet 1  . Budeson-Glycopyrrol-Formoterol (BREZTRI AEROSPHERE) 160-9-4.8 MCG/ACT AERO Inhale 2 puffs into the lungs 2 (two) times daily. 10.7 g 11  . carvedilol (COREG) 12.5 MG tablet Take 12.5 mg by mouth 2 (two) times daily.    . cloNIDine (CATAPRES) 0.2 MG tablet Take 0.2 mg by mouth at bedtime.    . clopidogrel (PLAVIX) 75 MG tablet Take 1 tablet (75 mg total) by mouth daily. 30 tablet 1  . diclofenac Sodium (VOLTAREN) 1 % GEL Apply 2 g topically 4 (four) times daily as needed (pain).     Marland Kitchen dicyclomine (BENTYL) 20 MG tablet Take 20 mg by mouth 3 (three) times daily.    Marland Kitchen donepezil (ARICEPT) 5 MG tablet Take 5 mg by mouth daily.    . DULoxetine (CYMBALTA) 30 MG capsule Take 30 mg by mouth daily.    . feeding supplement, ENSURE ENLIVE, (ENSURE ENLIVE) LIQD Take 237 mLs by mouth 2 (two) times daily between meals. 237 mL 12  . Ferrous Sulfate (IRON HIGH-POTENCY) 325 MG TABS Take 325 mg by mouth daily with breakfast.    . gabapentin (NEURONTIN) 100 MG capsule Take 100 mg by mouth 3 (three) times daily.    . hydrOXYzine (ATARAX/VISTARIL) 25 MG tablet Take 25 mg by mouth at bedtime as needed (sleep).     . magnesium oxide (MAG-OX) 400 MG tablet Take 400 mg by mouth 2 (two) times daily.    . megestrol (MEGACE) 40 MG tablet Take 40 mg by mouth 2 (two) times daily.    . methocarbamol (ROBAXIN) 500 MG tablet Take 500 mg by mouth 3  (three) times daily as needed (back spasms).     . Multiple Vitamin (MULTIVITAMIN WITH MINERALS) TABS tablet Take 1 tablet by mouth daily.    . nitroGLYCERIN (NITROSTAT) 0.4 MG SL tablet Place 1 tablet (0.4 mg total) under the tongue every 5 (five) minutes as needed for chest pain. 30 tablet 0  . ondansetron (ZOFRAN ODT) 4 MG disintegrating tablet Take 1 tablet (4 mg total) by mouth every 4 (four) hours as needed for nausea or vomiting. 20 tablet 0  . pantoprazole (PROTONIX) 40 MG tablet Take 1 tablet (40 mg total) by mouth daily. 30 tablet 0  . rosuvastatin (CRESTOR) 40 MG tablet Take 1 tablet (40 mg total) by mouth daily.    . sodium bicarbonate 650 MG tablet Take 2 tablets (1,300 mg total) by mouth 2 (two) times daily. (Patient taking differently: Take 650  mg by mouth daily.) 60 tablet 0  . Vitamin D, Ergocalciferol, (DRISDOL) 1.25 MG (50000 UNIT) CAPS capsule Take 50,000 Units by mouth every Monday.     No current facility-administered medications on file prior to visit.    ALLERGIES: No Known Allergies  FAMILY HISTORY: Family History  Problem Relation Age of Onset  . Heart attack Mother   . Heart disease Mother   . Hyperlipidemia Mother   . Hypertension Mother   . Heart attack Father   . Heart disease Father        Before age 24  . Hyperlipidemia Father   . Hypertension Father   . Cancer Sister        brain tumor  . Aneurysm Brother        brain  . Colon cancer Neg Hx   . Liver cancer Neg Hx     SOCIAL HISTORY: Social History   Socioeconomic History  . Marital status: Single    Spouse name: Not on file  . Number of children: 2  . Years of education: 11th  . Highest education level: Not on file  Occupational History  . Occupation: Retired  Tobacco Use  . Smoking status: Current Every Day Smoker    Packs/day: 1.00    Years: 50.00    Pack years: 50.00    Types: Cigarettes  . Smokeless tobacco: Never Used  Vaping Use  . Vaping Use: Never used  Substance and  Sexual Activity  . Alcohol use: No  . Drug use: No  . Sexual activity: Not on file  Other Topics Concern  . Not on file  Social History Narrative    Lives at home with her brother.   Right-handed.   Occasional caffeine use.   Social Determinants of Health   Financial Resource Strain: Not on file  Food Insecurity: Not on file  Transportation Needs: Not on file  Physical Activity: Not on file  Stress: Not on file  Social Connections: Not on file  Intimate Partner Violence: Not on file     PHYSICAL EXAM: Vitals:   11/20/20 1108  BP: (!) 161/91  Pulse: 83  SpO2: 97%   General: No acute distress Head:  Normocephalic/atraumatic Skin/Extremities: No rash, no edema Neurological Exam: alert and oriented to person, place, month/season. Year is 2021. No aphasia or dysarthria. Fund of knowledge is appropriate.  Recent and remote memory are impaired.  Attention and concentration are reduced. MMSE 21/30 MMSE - Mini Mental State Exam 11/20/2020  Orientation to time 3  Orientation to Place 5  Registration 3  Attention/ Calculation 1  Recall 1  Language- name 2 objects 2  Language- repeat 0  Language- follow 3 step command 3  Language- read & follow direction 1  Write a sentence 1  Copy design 1  Total score 21   Cranial nerves: Pupils equal, round. Extraocular movements intact with no nystagmus. Visual fields full.  No facial asymmetry.  Motor: Bulk and tone normal, no cogwheeling, muscle strength 5/5 throughout with no pronator drift.   Finger to nose testing intact.  Gait slow and cautious, no ataxia. There is no resting tremor. She has bilateral postural and endpoint tremors, L>R   IMPRESSION: This is a 80 yo RH woman with a history of hypertension, hyperlipidemia, neuropathy, anxiety, migraines, and mild dementia, likely due to Alzheimer's disease. MMSE today 21/30. They both report memory has been stable, continue Donepezil 5mg  daily. Her main concern continues to be tremor,  she is now  off Depakote. No parkinsonian signs seen. We discussed increasing gabapentin 100mg : take 1 cap in AM, 1 cap at noon, 2 caps qhs as this can help with essential tremor in some patients, side effects discussed. Continue 24/7 supervision. She does not drive. Follow-up in 6-8 months, they know to call for any changes.   Thank you for allowing me to participate in her care.  Please do not hesitate to call for any questions or concerns.   Ellouise Newer, M.D.   CC: Dr. Lucila Maine

## 2020-12-05 ENCOUNTER — Ambulatory Visit (INDEPENDENT_AMBULATORY_CARE_PROVIDER_SITE_OTHER): Payer: Medicare HMO

## 2020-12-05 ENCOUNTER — Other Ambulatory Visit: Payer: Self-pay

## 2020-12-05 ENCOUNTER — Ambulatory Visit (INDEPENDENT_AMBULATORY_CARE_PROVIDER_SITE_OTHER): Payer: Medicare HMO | Admitting: Internal Medicine

## 2020-12-05 ENCOUNTER — Encounter: Payer: Self-pay | Admitting: Internal Medicine

## 2020-12-05 DIAGNOSIS — F17210 Nicotine dependence, cigarettes, uncomplicated: Secondary | ICD-10-CM | POA: Diagnosis not present

## 2020-12-05 DIAGNOSIS — J449 Chronic obstructive pulmonary disease, unspecified: Secondary | ICD-10-CM

## 2020-12-05 MED ORDER — BREZTRI AEROSPHERE 160-9-4.8 MCG/ACT IN AERO: 2.0000 | INHALATION_SPRAY | Freq: Two times a day (BID) | RESPIRATORY_TRACT | 0 refills | Status: DC

## 2020-12-05 NOTE — Patient Instructions (Addendum)
Plan A = Automatic = Always=    breztri Take 2 puffs first thing in am and then another 2 puffs about 12 hours later.   Work on inhaler technique:  relax and gently blow all the way out then take a nice smooth deep breath back in, triggering the inhaler at same time you start breathing in.  Hold for up to 5 seconds if you can. Blow out thru nose. Rinse and gargle with water when done      Plan B = Backup (to supplement plan A, not to replace it) Only use your albuterol inhaler as a rescue medication to be used if you can't catch your breath by resting or doing a relaxed purse lip breathing pattern.  - The less you use it, the better it will work when you need it. - Ok to use the inhaler up to 2 puffs  every 4 hours if you must but call for appointment if use goes up over your usual need - Don't leave home without it !!  (think of it like the spare tire for your car)   Plan C = Crisis (instead of Plan B but only if Plan B stops working) - only use your albuterol nebulizer if you first try Plan B and it fails to help > ok to use the nebulizer up to every 4 hours but if start needing it regularly call for immediate appointment  Please remember to go to the  x-ray department  for your tests - we will call you with the results when they are available    The key is to stop smoking completely before smoking completely stops you!   PFT's on return / has #16 on breztri sample, given 4 more weeks   Please schedule a follow up office visit in 4 weeks, call sooner if needed with all medications /inhalers/ solutions in hand so we can verify exactly what you are taking. This includes all medications from all doctors and over the [place] separate them into two bags:  the ones you take automatically, no matter what, vs the ones you take just when you feel you need them "BAG #2 is UP TO YOU"  - this will really help [place] help you take your medications more effectively.

## 2020-12-05 NOTE — Progress Notes (Signed)
Alyssa [name], female    DOB: [birth year]     MRN: [id number]   Brief Alyssa [name] profile:  52 yobf active smoker  With onset doe in 08X  With diastolic dysfunction and CRI assoc with chronic met acidosis and no spirometry on record then admit:     Admit date: 07/06/2020 Discharge date: 07/10/2020  Admitted From: Home Discharge disposition: Home   Code Status: Full Code  Diet Recommendation: Cardiac diet  Discharge Diagnosis:   Principal Problem:   Metabolic acidosis Active Problems:   Shortness of breath   PVD (peripheral vascular disease) (HCC)   CKD (chronic kidney disease) stage 4, GFR 15-29 ml/min (HCC)   Anemia   Protein-calorie malnutrition, severe (HCC)   Essential hypertension   Abdominal pain   Elevated INR   History of Present Illness / Brief narrative:  Alyssa [name] is a 80 year old female history of CKD stage IV, hypertension, PAD who was admitted with COVID-19 and acute kidney injury last month. Since discharge Alyssa [name] has been experiencing progressively worsening shortness of breath.  9/19, she had shortness of breath, fever chills and rigors EMS was called.   EMS noted a temperature of 100.8.  In the ED, Alyssa [name] had a temperature of 99, tachycardic to 120, respiratory 25, blood pressure 160/102 Labs with lactate level of 3.2, serum bicarbonate level 9, anion gap 15 Chest x-ray did not show any acute infiltrates. Blood gas showed normal pH of 7.4, PCO2 low at 19, serum bicarb low at 12 Urinalysis showed large leukocyte esterase, negative nitrates, 6-10 WBCs, negative for blood.  Alyssa [name] was admitted to hospitalist service for further evaluation management.  Subjective:  Seen and examined this morning. Pleasant elderly [name].  Lying down in bed.  Not in distress. No fever last 48 hours.  Blood pressure in 160s.  Breathing comfortably on room air.  Feels ready to go home today.  Hospital Course:  Acute on chronic metabolic  acidosis with respiratory compensation -Alyssa [name] has chronic metabolic acidosis and was supposed to be on sodium bicarbonate tablet which apparently she was not compliant to. -She presented with a low bicarbonate level of 9.  Blood gas showed PCO2 level at 19. -Alyssa [name] probably was hyperventilating to generate respiratory compensation for acute worsening of metabolic acidosis. -Pulmonary embolism was ruled out by a negative VQ scan. -Nephrology consult appreciated.  She was started on bicarbonate drip and subsequently switched to resume sodium bicarbonate tablets 1300 mg twice daily. -Continue to follow-up with nephrology as an outpatient  Sepsis ruled out -Initially suspected of sepsis because of respiratory distress. -Chest x-ray and CT imaging did not show any new infiltrates in lungs. -Sepsis ruled out now.  Antibiotics stopped.  Falls/tremors -Alyssa [name] reports tremors and intermittent falls at home. -9/22, MRI brain was obtained which did not show any evidence of acute intracranial abnormality.  Chronic microvascular ischemic changes were noted. -PT evaluation was obtained.  Alyssa [name] is functioning as ambulating independently. -Previous hospitalist switched her from Coreg to propanolol which we will continue at discharge.   Hypertension -Currently on clonidine, amlodipine and propanolol.  Blood pressure remains elevated to 160s today.  I would increase amlodipine from 2.5 mg to 5 mg daily at discharge.  CKD stage IV -Creatinine fluctuating but remains at baseline. -Follow-up with nephrology as an outpatient. 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 -Continue iron supplement at home. Recent Labs (within last 365 days)              Recent Labs    06/03/20 0413 06/03/20 1421 06/04/20 0251 06/05/20 0337 06/06/20 0628 06/07/20 0651 06/09/20 1604 07/06/20 1538 07/06/20 2147 07/10/20 0137  HGB 8.6* 8.2* 7.6* 7.1* 6.7* 9.2* 9.6* 9.1* 7.8* 8.5*     COVID-19 pneumonia 06/07/20 transiently needed 02 inpt only/ had been vaccinated prior -No residual symptoms.      History of Present Illness  09/25/2020  Pulmonary/ 1st office eval/Kryssa Risenhoover  Chief Complaint  Alyssa [name] presents with  . Consult    Shortness of breath with activity and sometimes when resting  Dyspnea:  25 ft with rollator for a year  Cough: none  Sleep: props up one one pillow/ bed is flat SABA use: couple times a day seems to help rec Stop propranolol and start bisoprolol 10 mg daily  Plan A = Automatic = Always=    Breztri Take 2 puffs first thing in am and then another 2 puffs about 12 hours later.  Work on inhaler technique: Plan B = Backup (to supplement plan A, not to replace it) Only use your albuterol inhaler as a rescue medication The key is to stop smoking completely before smoking completely stops you! Please schedule a follow up office visit in 4 weeks, sooner if needed with pfts     11/07/2020  f/u ov/[name] re: copd clinical dx/ vaccinated and boosted Chief Complaint  Alyssa [name] presents with  . Follow-up    Breathing has improved slightly since the last visit. She is using her albuterol inhaler 3 x per wk on average.    COPD  GOLD ? still smoking  Active smoker -  09/25/2020  After extensive coaching inhaler device,  effectiveness =    75% from baseline 25 % > breztri trial  Essential hypertension Changed propranolol to bisoprolol 09/25/2020 > did not do  Dyspnea:  50 ft with rollator  Cough: none  Sleeping: bed is flat/ one pillow  SABA use: as above  02: none and does not check at home  rec No change in medications Breztri  Take 2  puffs first thing in am and then another 2 puffs about 12 hours later.  - 2 at home and 2 samples given today  The key is to stop smoking completely before smoking completely stops you! Make sure you check your oxygen saturation  at your highest level of activity Please schedule a follow up office visit in 4 weeks, sooner if needed  with all medications /inhalers/ solutions in hand so we can verify exactly what you are taking. This includes all medications from all doctors and over the counters      12/05/2020  f/u ov/Juris Gosnell re:  COPD / still smoking / breztri  Chief Complaint  Alyssa [name] presents with  . Follow-up    Breathing is doing better since the last visit. She has been wheezing more over the past few wks and has had minimal cough-non prod. She is using her albuterol inhaler 4 x per day.   Dyspnea:  No change doe =across the room  Cough: not much Sleeping: bed is flat, one pillow  SABA use: way  too much/ very poor insight about how/when to use   02: none  Covid status:  vax x 3    No obvious day to day or daytime variability or assoc excess/ purulent sputum or mucus plugs or hemoptysis or cp or chest tightness, subjective wheeze or overt sinus or hb symptoms.   sleeping without nocturnal  or early am exacerbation  of respiratory  c/o's or need for noct saba. Also denies any obvious fluctuation of symptoms with weather or environmental changes or other aggravating or alleviating factors except as outlined above   No unusual exposure hx or h/o childhood pna/ asthma or knowledge of premature birth.  Current Allergies, Complete Past Medical History, Past Surgical History, Family History, and Social History were reviewed in [employer] record.  ROS  The following are not active complaints unless bolded Hoarseness, sore throat, dysphagia, dental problems, itching, sneezing,  nasal congestion or discharge of excess mucus or purulent secretions, ear ache,   fever,  chills, sweats, unintended wt loss or wt gain, classically pleuritic or exertional cp,  orthopnea pnd or arm/hand swelling  or leg swelling, presyncope, palpitations, abdominal pain, anorexia, nausea, vomiting, diarrhea  or change in bowel habits or change in bladder habits, change in stools or change in urine, dysuria, hematuria,  rash, arthralgias, visual complaints, headache, numbness, weakness or ataxia or problems with walking or coordination,  change in mood or  memory.        Current Meds  Medication Sig  . acetaminophen (TYLENOL) 325 MG tablet Take 2 tablets (650 mg total) by mouth every 6 (six) hours as needed for mild pain, moderate pain or fever (or Fever >/= 101).  Marland Kitchen albuterol (VENTOLIN HFA) 108 (90 Base) MCG/ACT inhaler Inhale 2 puffs into the lungs every 4 (four) hours as needed for wheezing or shortness of breath.   Marland Kitchen amLODipine (NORVASC) 5 MG tablet Take 7.5 mg by mouth at bedtime.  Marland Kitchen aspirin 81 MG EC tablet Take 1 tablet (81 mg total) by mouth daily.  . Budeson-Glycopyrrol-Formoterol (BREZTRI AEROSPHERE) 160-9-4.8 MCG/ACT AERO Inhale 2 puffs into the lungs 2 (two) times daily.  . carvedilol (COREG) 12.5 MG tablet Take 12.5 mg by mouth 2 (two) times daily.  . cloNIDine (CATAPRES) 0.2 MG tablet Take 0.2 mg by mouth at bedtime.  . clopidogrel (PLAVIX) 75 MG tablet Take 1 tablet (75 mg total) by mouth daily.  . diclofenac Sodium (VOLTAREN) 1 % GEL Apply 2 g topically 4 (four) times daily as needed (pain).   Marland Kitchen dicyclomine (BENTYL) 20 MG tablet Take 20 mg by mouth 3 (three) times daily.  Marland Kitchen donepezil (ARICEPT) 5 MG tablet Take 1 tablet (5 mg total) by mouth daily.  . DULoxetine (CYMBALTA) 30 MG capsule Take 30 mg by mouth daily.  . feeding supplement, ENSURE ENLIVE, (ENSURE ENLIVE) LIQD Take 237 mLs by mouth 2 (two) times daily between meals.  . Ferrous Sulfate (IRON HIGH-POTENCY) 325 MG TABS Take 325 mg by mouth daily with breakfast.  . gabapentin (NEURONTIN) 100 MG capsule Take 1  tablet in morning, 1 tablet at noon, 2 tablets at night  . hydrOXYzine (ATARAX/VISTARIL) 25 MG tablet Take 25 mg by mouth at bedtime as needed (sleep).   . magnesium oxide (MAG-OX) 400 MG tablet Take 400 mg by mouth 2 (two) times daily.  . megestrol (MEGACE) 40 MG tablet Take 40 mg by mouth 2 (two) times daily.  . methocarbamol (ROBAXIN) 500 MG tablet Take 500 mg by mouth 3 (three)  times daily as needed (back spasms).   . Multiple Vitamin (MULTIVITAMIN WITH MINERALS) TABS tablet Take 1 tablet by mouth daily.  . nitroGLYCERIN (NITROSTAT) 0.4 MG SL tablet Place 1 tablet (0.4 mg total) under the tongue every 5 (five) minutes as needed for chest pain.  Marland Kitchen ondansetron (ZOFRAN ODT) 4 MG disintegrating tablet Take 1 tablet (4 mg total) by mouth every 4 (four) hours as needed for nausea or vomiting.  . pantoprazole (PROTONIX) 40 MG tablet Take 1 tablet (40 mg total) by mouth daily.  . rosuvastatin (CRESTOR) 40 MG tablet Take 1 tablet (40 mg total) by mouth daily.  . sodium bicarbonate 650 MG tablet Take 2 tablets (1,300 mg total) by mouth 2 (two) times daily. (Alyssa [name] taking differently: Take 650 mg by mouth daily.)  . Vitamin D, Ergocalciferol, (DRISDOL) 1.25 MG (50000 UNIT) CAPS capsule Take 50,000 Units by mouth every Monday.              Past Medical History:  Diagnosis Date  . Acute respiratory failure with hypoxia ([place]) 11/01/2019  . Anxiety   . Arthritis   . Chest pain   . Chronic kidney disease   . Dyspnea   . Elevated troponin 12/13/2018  . Headache(784.0)   . Hyperlipidemia   . Hypertension   . Joint pain   . Leg pain   . Peripheral neuropathy   . Peripheral vascular disease (HCC)       Objective:     12/05/2020        109   11/07/20 103 lb (46.7 kg)  10/17/20 99 lb 6.4 oz (45.1 kg)  10/02/20 100 lb 12 oz (45.7 kg)    Vital signs reviewed  12/05/2020  - Note at rest 02 sats  98% on RA   General appearance:   Chronically ill elderly bf nad    HEENT : pt wearing mask  not removed for exam due to covid -19 concerns.    NECK :  without JVD/Nodes/TM/ nl carotid upstrokes bilaterally   LUNGS: no acc muscle use,  Mod barrel  contour chest wall with bilateral  Distant bs s audible wheeze and  without cough on insp or exp maneuvers and mod  Hyperresonant  to  percussion bilaterally     CV:  RRR  no s3 or murmur or increase in P2, and no edema   ABD:  soft and nontender with pos mid insp Hoover's  in the supine position. No bruits or organomegaly appreciated, bowel sounds nl  MS:     ext warm without deformities, calf tenderness, cyanosis or clubbing No obvious joint restrictions   SKIN: warm and dry without lesions    NEURO:  alert, approp, nl sensorium with  no motor or cerebellar deficits apparent.       CXR PA and Lateral:   12/05/2020 :    I personally reviewed images and agree  impression as follows: copd/cm/ gen increased markings, non-specific       Assessment

## 2020-12-06 ENCOUNTER — Encounter: Payer: Self-pay | Admitting: Internal Medicine

## 2020-12-06 NOTE — Assessment & Plan Note (Signed)
Active smoker -  09/25/2020   breztri trial  - 11/07/2020 says has 2 full breztri's at home but brought in empty one - 12/05/2020  After extensive coaching inhaler device,  effectiveness =    75%  So given 4 weeks of breztri samples pending return for pfts    Group D in terms of symptom/risk and laba/lama/ICS  therefore appropriate rx at this point >>>  breztri plus approp saba/ overusing  I spent extra time with pt today reviewing appropriate use of albuterol for prn use on exertion with the following points: 1) saba is for relief of sob that does not improve by walking a slower pace or resting but rather if the pt does not improve after trying this first. 2) If the pt is convinced, as many are, that saba helps recover from activity faster then it's easy to tell if this is the case by re-challenging : ie stop, take the inhaler, then p 5 minutes try the exact same activity (intensity of workload) that just caused the symptoms and see if they are substantially diminished or not after saba 3) if there is an activity that reproducibly causes the symptoms, try the saba 15 min before the activity on alternate days   If in fact the saba really does help, then fine to continue to use it prn but advised may need to look closer at the maintenance regimen being used to achieve better control of airways disease with exertion.

## 2020-12-06 NOTE — Assessment & Plan Note (Signed)
Counseled re importance of smoking cessation but did not meet time criteria for separate billing          Each maintenance medication was reviewed in detail including emphasizing most importantly the difference between maintenance and prns and under what circumstances the prns are to be triggered using an action plan format where appropriate.  Total time for H and P, chart review, counseling, reviewing hfa device(s) , directly observing portions of ambulatory 02 saturation study/ and generating customized AVS unique to this office visit / same day charting  > 30 min                    

## 2020-12-08 IMAGING — DX DG CHEST 2V
2 series · 2 of 2 positions shown · non-contrast
Comparison: Radiograph 09/17/2019

CLINICAL DATA: Shortness of chest pain for several days

EXAM:
CHEST - 2 VIEW

[w chest lat]
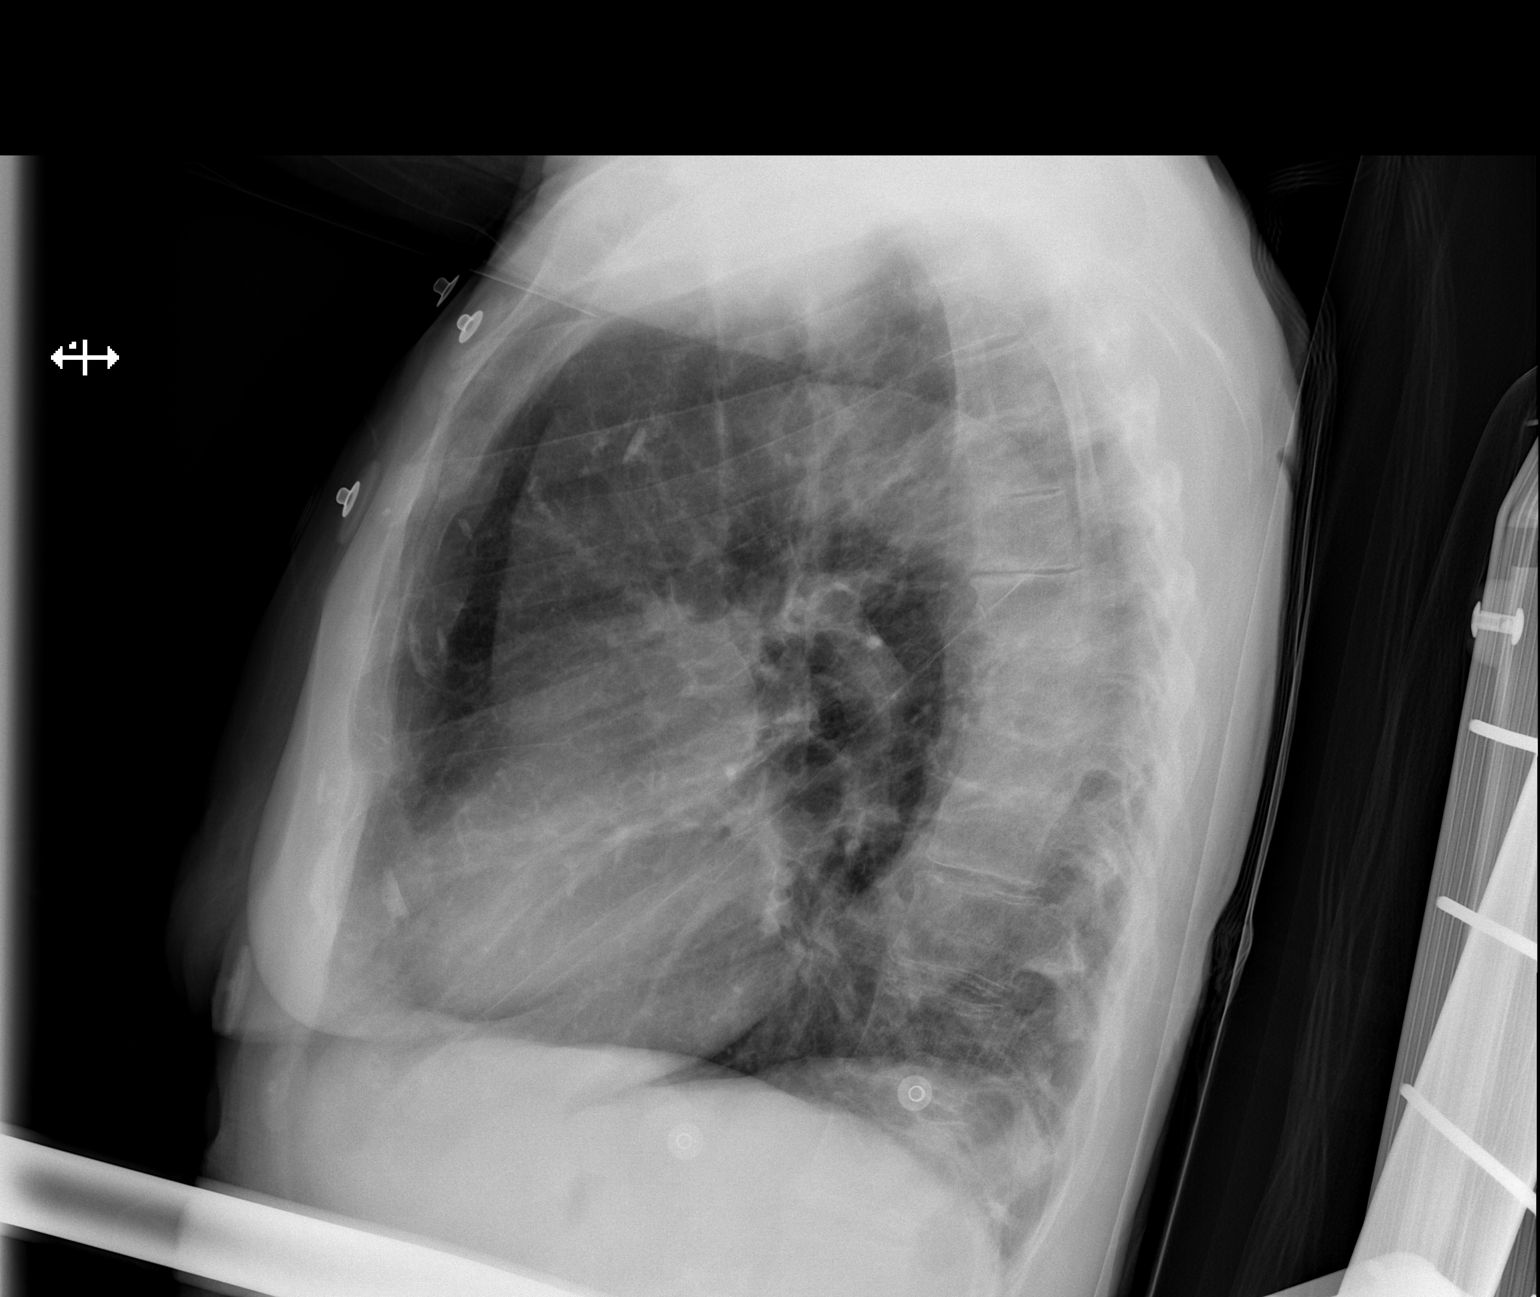

[x chest ap]
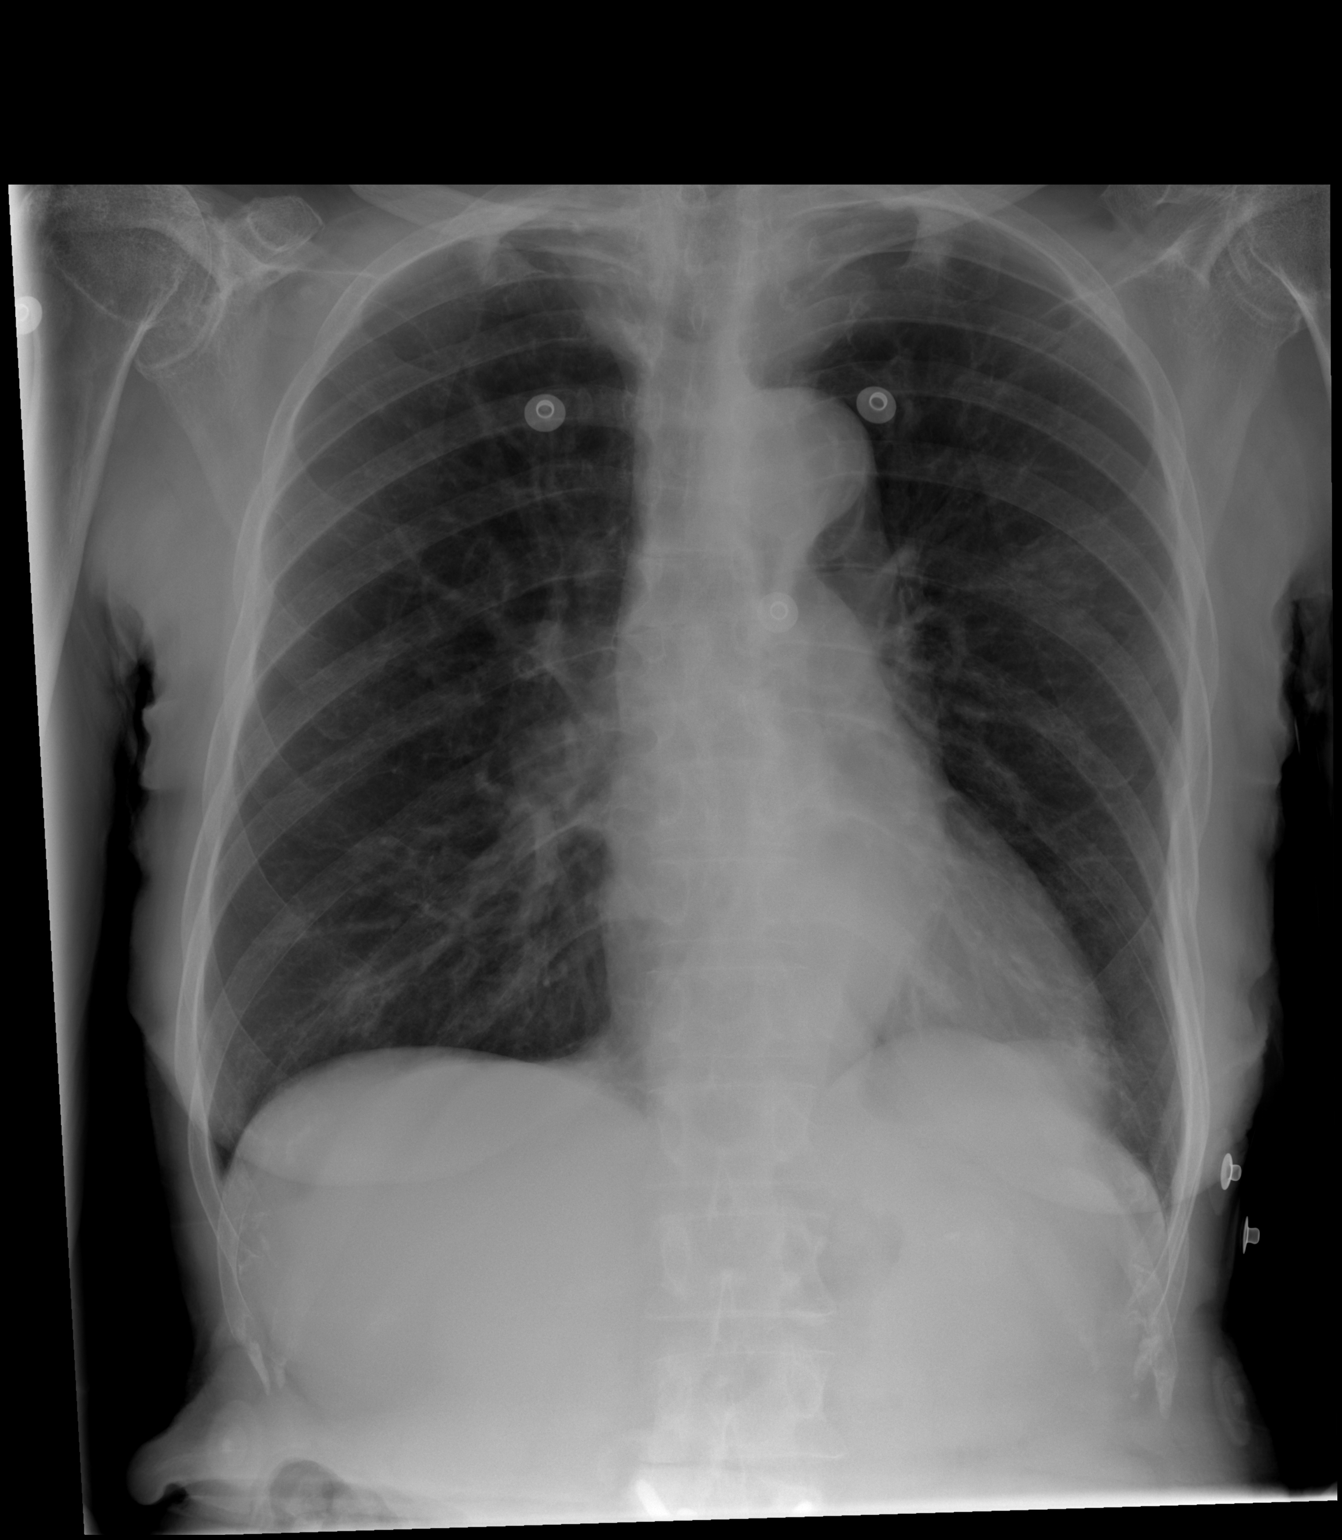

[2 of 2 positions shown; findings below may reference images not displayed]

FINDINGS: Patchy nodular opacities are present in the right lower lobe. No
other consolidative airspace disease is seen. Lobular contour
deformity of the left hemidiaphragm may reflect a small eventration
or Bochdalek's hernia. No pneumothorax or effusion. The
cardiomediastinal contours are unremarkable. No acute osseous or
soft tissue abnormality. Degenerative changes are present in the
imaged spine and shoulders.
IMPRESSION: 1. Patchy nodular opacities in the right lower lobe suspicious for
pneumonia though recommend continued surveillance until resolution
as underlying lesion cannot be excluded.
2. Lobular contour deformity of the left hemidiaphragm may reflect a
small eventration or Bochdalek's hernia.

## 2020-12-09 ENCOUNTER — Encounter: Payer: Self-pay | Admitting: *Deleted

## 2020-12-09 IMAGING — CT CT ABD-PELV W/O CM
2 of 4 series · 15 of 46 positions shown, 17 images · non-contrast
Comparison: June 07, 2019

CLINICAL DATA: Abdominal distension diarrhea and chills

EXAM:
CT ABDOMEN AND PELVIS WITHOUT CONTRAST
TECHNIQUE: Multidetector CT imaging of the abdomen and pelvis was performed
following the standard protocol without IV contrast.

[Series 3: ap without · axial · non-contrast · 0.56mm/px · z∈[-246,+70]mm · 12 of 71 slices shown, 14 images]
[im 4/71  soft-tissue]
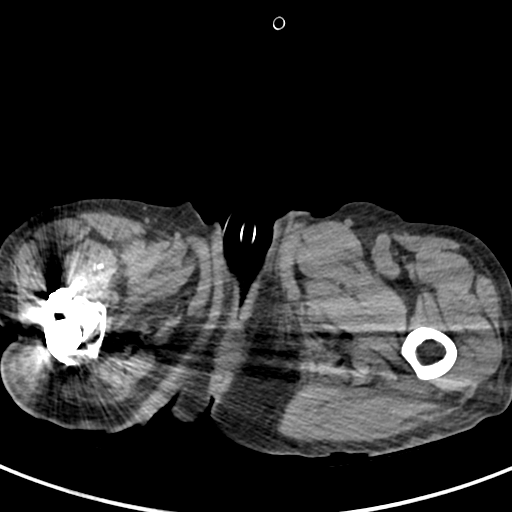
[im 4/71  bone]
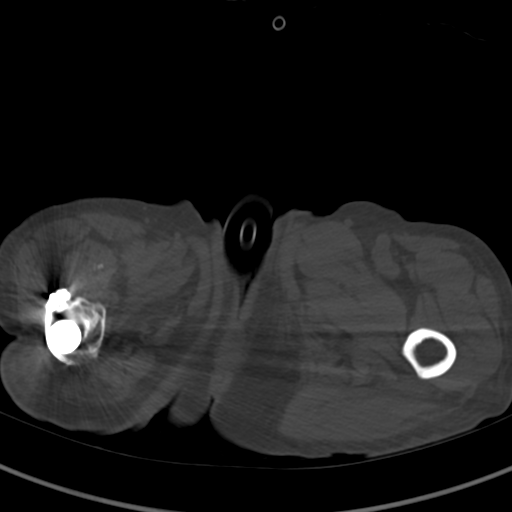
[im 11/71  soft-tissue]
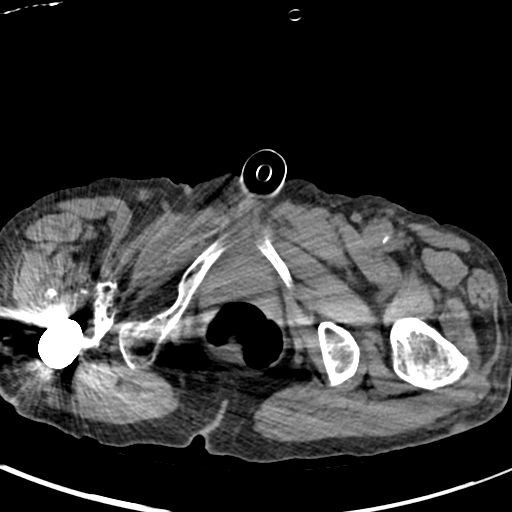
[im 17/71  soft-tissue]
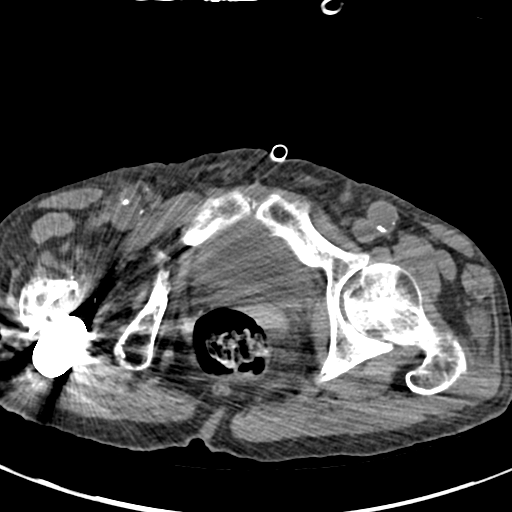
[im 21/71  soft-tissue]
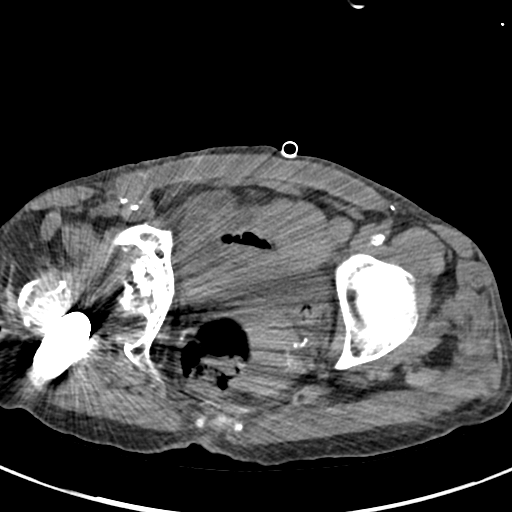
[im 27/71  soft-tissue]
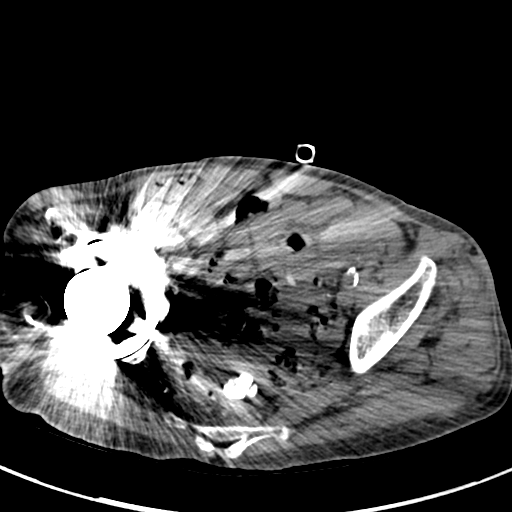
[im 34/71  soft-tissue]
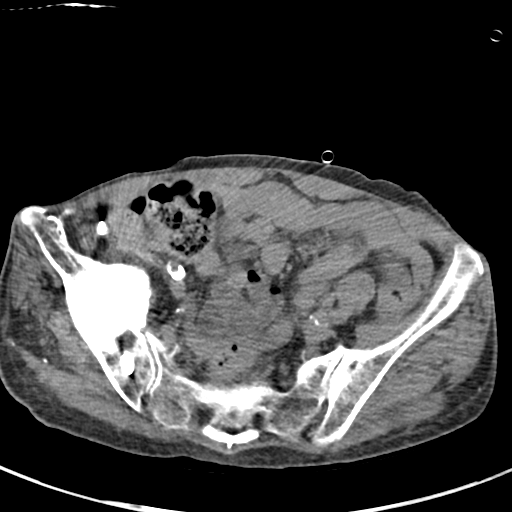
[im 37/71  soft-tissue]
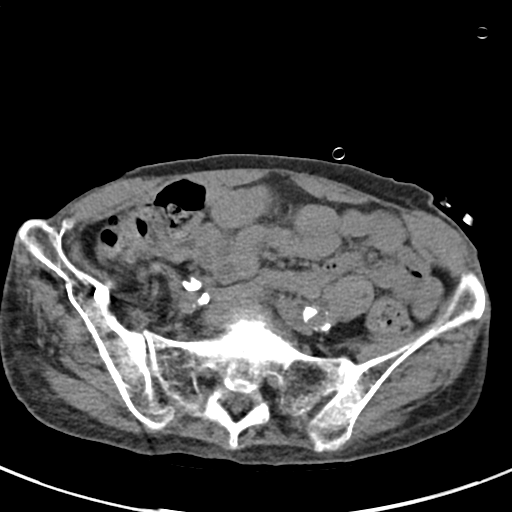
[im 44/71  soft-tissue]
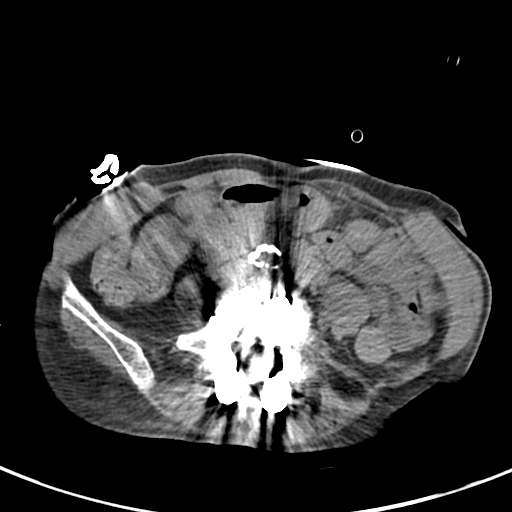
[im 51/71  soft-tissue]
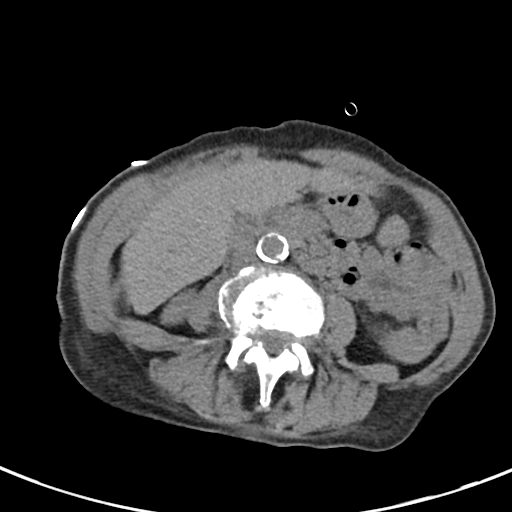
[im 51/71  bone]
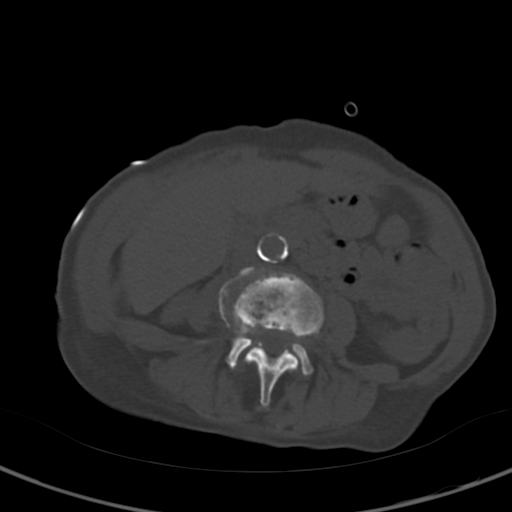
[im 54/71  soft-tissue]
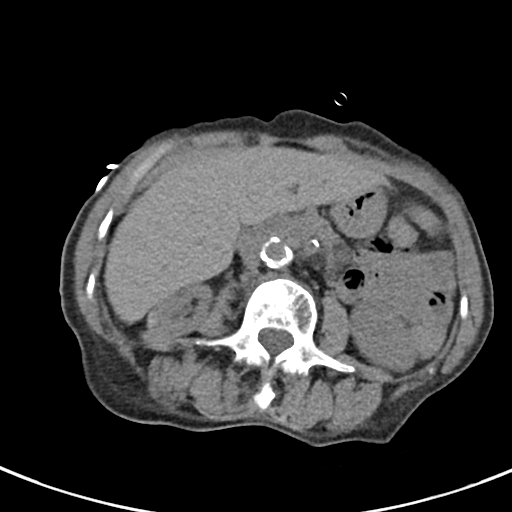
[im 61/71  soft-tissue]
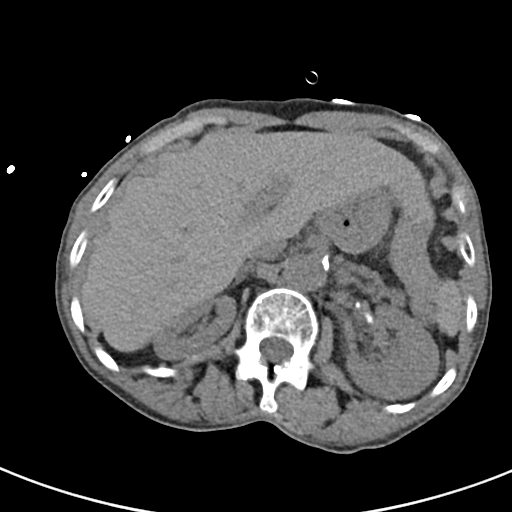
[im 67/71  soft-tissue]
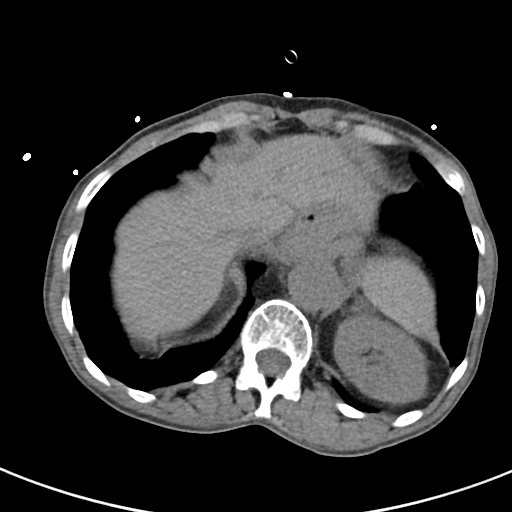

[Series 6: cor · coronal · 0.54mm/px · 3 of 72 slices shown]
[im 24/72  soft-tissue]
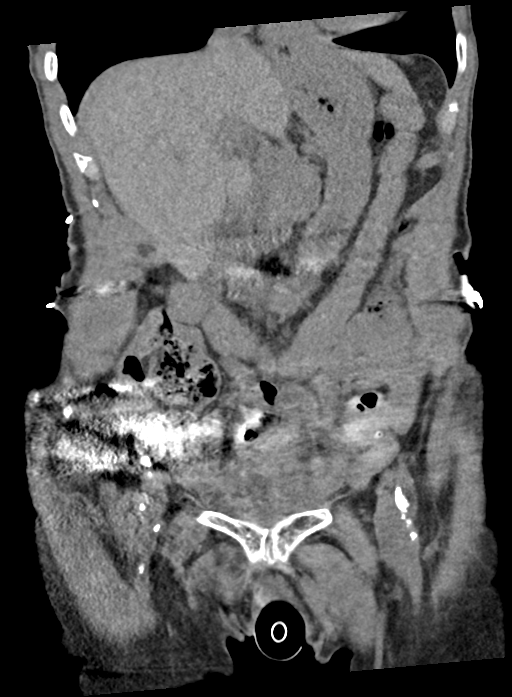
[im 32/72  soft-tissue]
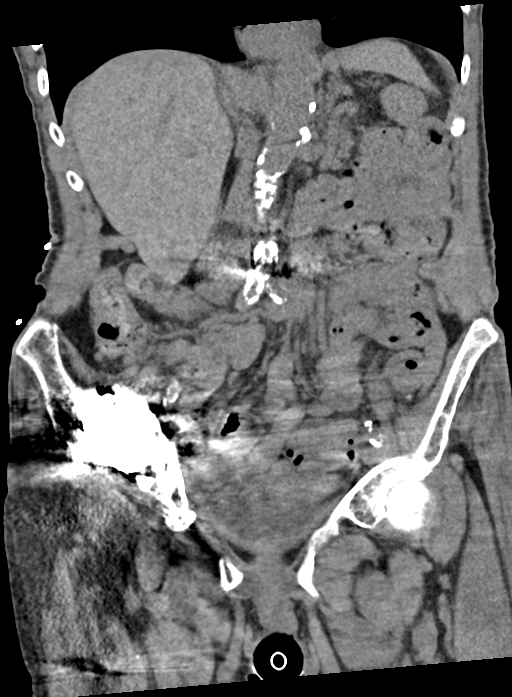
[im 40/72  soft-tissue]
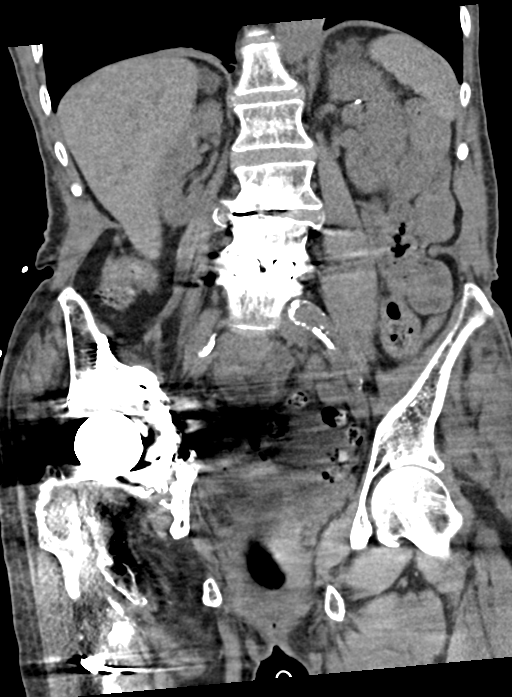

[15 of 46 positions shown; findings below may reference images not displayed]

FINDINGS: Lower chest: The visualized heart size within normal limits. No
pericardial fluid/thickening.

A small to moderate hiatal hernia is present.

The visualized portions of the lungs are clear.

Hepatobiliary: Although limited due to the lack of intravenous
contrast, normal in appearance without gross focal abnormality. No
evidence of calcified gallstones or biliary ductal dilatation.

Pancreas:  Unremarkable.  No surrounding inflammatory changes.

Spleen: Normal in size. Although limited due to the lack of
intravenous contrast, normal in appearance.

Adrenals/Urinary Tract: Both adrenal glands appear normal. Again
noted is slight atrophy of the right kidney. No renal or collecting
system calculi are seen. The bladder is partially distended.

Stomach/Bowel: The small bowel is normal in appearance. There is
somewhat limited visualization of the distal colon, however there
appears to be a moderate amount of colonic stool present. There is
question of mild wall thickening seen at the hepatic flexure, series
3, image 30. No significant surrounding fat stranding changes
however are noted. Scattered colonic diverticula are noted, however
no definite evidence of surrounding fat stranding changes.

Vascular/Lymphatic: There are no enlarged abdominal or pelvic lymph
nodes. Scattered aortic atherosclerotic calcifications are seen
without aneurysmal dilatation.

Reproductive: The uterus and adnexa are unremarkable.

Other: No evidence of abdominal wall mass or hernia.

Musculoskeletal: Nonunited lateral right tenth rib fracture is seen.
Again noted is a right total hip arthroplasty with extensive areas
of osteolysis and slight superior migration of the acetabular cup,
which dates back to 2147. The patient has had a prior posterior
lumbar fixation at L4-L5 with a grade 1 anterolisthesis of L5 on S1.
Increased sclerosis seen at the endplates of L3-L4. healed bilateral
inferior pubic rami fractures are seen.
IMPRESSION: 1. Somewhat limited examination
2. Question of mild wall thickening seen at the hepatic flexure
which could be due to mild colitis.
3. Moderate hiatal hernia
4.  Aortic Atherosclerosis ([id number]).

## 2020-12-30 ENCOUNTER — Other Ambulatory Visit (HOSPITAL_COMMUNITY)
Admission: RE | Admit: 2020-12-30 | Discharge: 2020-12-30 | Disposition: A | Discharge status: Home / Self Care | Payer: Medicare HMO | Source: Ambulatory Visit | Attending: Internal Medicine | Admitting: Internal Medicine

## 2020-12-30 DIAGNOSIS — Z01812 Encounter for preprocedural laboratory examination: Secondary | ICD-10-CM | POA: Diagnosis present

## 2020-12-30 DIAGNOSIS — Z20822 Contact with and (suspected) exposure to covid-19: Secondary | ICD-10-CM | POA: Insufficient documentation

## 2020-12-30 LAB — SARS CORONAVIRUS 2 (TAT 6-24 HRS): SARS Coronavirus 2: NEGATIVE

## 2021-01-02 ENCOUNTER — Ambulatory Visit (INDEPENDENT_AMBULATORY_CARE_PROVIDER_SITE_OTHER): Payer: Medicare HMO | Admitting: Internal Medicine

## 2021-01-02 ENCOUNTER — Other Ambulatory Visit: Payer: Self-pay

## 2021-01-02 ENCOUNTER — Encounter: Payer: Self-pay | Admitting: Internal Medicine

## 2021-01-02 DIAGNOSIS — J449 Chronic obstructive pulmonary disease, unspecified: Secondary | ICD-10-CM

## 2021-01-02 DIAGNOSIS — F17210 Nicotine dependence, cigarettes, uncomplicated: Secondary | ICD-10-CM | POA: Diagnosis not present

## 2021-01-02 LAB — PULMONARY FUNCTION TEST
DL/VA % pred: 62 %
DL/VA: 2.66 ml/min/mmHg/L
DLCO cor % pred: 48 %
DLCO cor: 7.71 ml/min/mmHg
DLCO unc % pred: 48 %
DLCO unc: 7.71 ml/min/mmHg
FEF 25-75 Post: 0.87 L/sec
FEF 25-75 Pre: 0.81 L/sec
FEF2575-%Change-Post: 7 %
FEF2575-%Pred-Post: 79 %
FEF2575-%Pred-Pre: 74 %
FEV1-%Change-Post: 0 %
FEV1-%Pred-Post: 110 %
FEV1-%Pred-Pre: 109 %
FEV1-Post: 1.34 L
FEV1-Pre: 1.33 L
FEV1FVC-%Change-Post: -1 %
FEV1FVC-%Pred-Pre: 92 %
FEV6-%Change-Post: 2 %
FEV6-%Pred-Post: 128 %
FEV6-%Pred-Pre: 126 %
FEV6-Post: 1.94 L
FEV6-Pre: 1.9 L
FEV6FVC-%Change-Post: 0 %
FEV6FVC-%Pred-Post: 104 %
FEV6FVC-%Pred-Pre: 105 %
FVC-%Change-Post: 2 %
FVC-%Pred-Post: 122 %
FVC-%Pred-Pre: 119 %
FVC-Post: 1.96 L
FVC-Pre: 1.9 L
Post FEV1/FVC ratio: 69 %
Post FEV6/FVC ratio: 99 %
Pre FEV1/FVC ratio: 70 %
Pre FEV6/FVC Ratio: 100 %
RV % pred: 161 %
RV: 3.35 L
TLC % pred: 121 %
TLC: 5.24 L

## 2021-01-02 NOTE — Assessment & Plan Note (Signed)
Active smoker -  09/25/2020   breztri trial  - 11/07/2020 says has 2 full breztri's at home but brought in empty one - 12/05/2020  After extensive coaching inhaler device,  effectiveness =    75%  So given 4 weeks of breztri samples pending return for pfts  - 12/05/2020   Walked on RA x one lap =  approx 250 ft -@ slow/rollator pace stopped due to sob with sats still 99%  - PFT's  01/02/21  FEV1 1.33 (109 % ) ratio 0.70  p 0 % improvement from saba p ? breztri ? saba (pt not sure) prior to study with DLCO  7.71 (48%) corrects to 2.66 (62%)  for alv volume and FV curve very minimal concavity - try off breztri 01/02/2021 >>>    > 3 min discussion I reviewed the Fletcher curve with the patient that basically indicates  if you quit smoking when your best day FEV1 is still well preserved (as is clearly  the case here)  it is highly unlikely you will progress to severe disease and informed the patient there was  no medication on the market that has proven to alter the curve/ its downward trajectory  or the likelihood of progression of their disease(unlike other chronic medical conditions such as atheroclerosis where we do think we can change the natural hx with risk reducing meds)    Therefore stopping smoking and maintaining abstinence are  the most important aspects of care, not choice of inhalers or for that matter, doctors.   Treatment other than smoking cessation  is entirely directed by severity of symptoms and focused also on reducing exacerbations, not attempting to change the natural history of the disease.   Since not sure limiting symptoms are related to min airflow and not having aecopd ok to try off breztri and just use saba prn  I spent extra time with pt today reviewing appropriate use of albuterol for prn use on exertion with the following points: 1) saba is for relief of sob that does not improve by walking a slower pace or resting but rather if the pt does not improve after trying this first. 2)  If the pt is convinced, as many are, that saba helps recover from activity faster then it's easy to tell if this is the case by re-challenging : ie stop, take the inhaler, then p 5 minutes try the exact same activity (intensity of workload) that just caused the symptoms and see if they are substantially diminished or not after saba 3) if there is an activity that reproducibly causes the symptoms, try the saba 15 min before the activity on alternate days   If in fact the saba really does help, then fine to continue to use it prn but advised may need to look closer at the maintenance regimen being used to achieve better control of airways disease with exertion.

## 2021-01-02 NOTE — Assessment & Plan Note (Signed)
Counseled re importance of smoking cessation but did not meet time criteria for separate billing            Each maintenance medication was reviewed in detail including emphasizing most importantly the difference between maintenance and prns and under what circumstances the prns are to be triggered using an action plan format where appropriate.  Total time for H and P, chart review, counseling, reviewing hfa device(s) and generating customized AVS unique to this office visit / same day charting = 33 min

## 2021-01-02 NOTE — Progress Notes (Signed)
PFT done today. 

## 2021-01-02 NOTE — Progress Notes (Addendum)
Alyssa [name], female    DOB: [birth year]     MRN: [id number]   Brief patient profile:  52 yobf active smoker  With onset doe in 08X  With diastolic dysfunction and CRI assoc with chronic met acidosis and no spirometry on record then admit:     Admit date: 07/06/2020 Discharge date: 07/10/2020  Admitted From: Home Discharge disposition: Home   Code Status: Full Code  Diet Recommendation: Cardiac diet  Discharge Diagnosis:   Principal Problem:   Metabolic acidosis Active Problems:   Shortness of breath   PVD (peripheral vascular disease) (HCC)   CKD (chronic kidney disease) stage 4, GFR 15-29 ml/min (HCC)   Anemia   Protein-calorie malnutrition, severe (HCC)   Essential hypertension   Abdominal pain   Elevated INR   History of Present Illness / Brief narrative:  Patient is a 80 year old female history of CKD stage IV, hypertension, PAD who was admitted with COVID-19 and acute kidney injury last month. Since discharge patient has been experiencing progressively worsening shortness of breath.  9/19, she had shortness of breath, fever chills and rigors EMS was called.   EMS noted a temperature of 100.8.  In the ED, patient had a temperature of 99, tachycardic to 120, respiratory 25, blood pressure 160/102 Labs with lactate level of 3.2, serum bicarbonate level 9, anion gap 15 Chest x-ray did not show any acute infiltrates. Blood gas showed normal pH of 7.4, PCO2 low at 19, serum bicarb low at 12 Urinalysis showed large leukocyte esterase, negative nitrates, 6-10 WBCs, negative for blood.  Patient was admitted to hospitalist service for further evaluation management.  Subjective:  Seen and examined this morning. Pleasant elderly [name].  Lying down in bed.  Not in distress. No fever last 48 hours.  Blood pressure in 160s.  Breathing comfortably on room air.  Feels ready to go home today.  Hospital Course:  Acute on chronic metabolic  acidosis with respiratory compensation -Patient has chronic metabolic acidosis and was supposed to be on sodium bicarbonate tablet which apparently she was not compliant to. -She presented with a low bicarbonate level of 9.  Blood gas showed PCO2 level at 19. -Patient probably was hyperventilating to generate respiratory compensation for acute worsening of metabolic acidosis. -Pulmonary embolism was ruled out by a negative VQ scan. -Nephrology consult appreciated.  She was started on bicarbonate drip and subsequently switched to resume sodium bicarbonate tablets 1300 mg twice daily. -Continue to follow-up with nephrology as an outpatient  Sepsis ruled out -Initially suspected of sepsis because of respiratory distress. -Chest x-ray and CT imaging did not show any new infiltrates in lungs. -Sepsis ruled out now.  Antibiotics stopped.  Falls/tremors -Patient reports tremors and intermittent falls at home. -9/22, MRI brain was obtained which did not show any evidence of acute intracranial abnormality.  Chronic microvascular ischemic changes were noted. -PT evaluation was obtained.  Patient is functioning as ambulating independently. -Previous hospitalist switched her from Coreg to propanolol which we will continue at discharge.   Hypertension -Currently on clonidine, amlodipine and propanolol.  Blood pressure remains elevated to 160s today.  I would increase amlodipine from 2.5 mg to 5 mg daily at discharge.  CKD stage IV -Creatinine fluctuating but remains at baseline. -Follow-up with nephrology as an outpatient. 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 -Continue iron supplement at home. Recent Labs (within last 365 days)              Recent Labs    06/03/20 0413 06/03/20 1421 06/04/20 0251 06/05/20 0337 06/06/20 0628 06/07/20 0651 06/09/20 1604 07/06/20 1538 07/06/20 2147 07/10/20 0137  HGB 8.6* 8.2* 7.6* 7.1* 6.7* 9.2* 9.6* 9.1* 7.8* 8.5*     COVID-19 pneumonia 06/07/20 transiently needed 02 inpt only/ had been vaccinated prior -No residual symptoms.      History of Present Illness  09/25/2020  Pulmonary/ 1st office eval/Alyssa [name]  Chief Complaint  Patient presents with  . Consult    Shortness of breath with activity and sometimes when resting  Dyspnea:  25 ft with rollator for a year  Cough: none  Sleep: props up one one pillow/ bed is flat SABA use: couple times a day seems to help rec Stop propranolol and start bisoprolol 10 mg daily  Plan A = Automatic = Always=    Breztri Take 2 puffs first thing in am and then another 2 puffs about 12 hours later.  Work on inhaler technique: Plan B = Backup (to supplement plan A, not to replace it) Only use your albuterol inhaler as a rescue medication The key is to stop smoking completely before smoking completely stops you! Please schedule a follow up office visit in 4 weeks, sooner if needed with pfts     11/07/2020  f/u ov/Alyssa [name] re: copd clinical dx/ vaccinated and boosted Chief Complaint  Patient presents with  . Follow-up    Breathing has improved slightly since the last visit. She is using her albuterol inhaler 3 x per wk on average.    COPD  GOLD ? still smoking  Active smoker -  09/25/2020  After extensive coaching inhaler device,  effectiveness =    75% from baseline 25 % > breztri trial  Essential hypertension Changed propranolol to bisoprolol 09/25/2020 > did not do  Dyspnea:  50 ft with rollator  Cough: none  Sleeping: bed is flat/ one pillow  SABA use: as above  02: none and does not check at home  rec No change in medications Breztri  Take 2  puffs first thing in am and then another 2 puffs about 12 hours later.  - 2 at home and 2 samples given today  The key is to stop smoking completely before smoking completely stops you! Make sure you check your oxygen saturation  at your highest level of activity Please schedule a follow up office visit in 4 weeks, sooner if needed  with all medications /inhalers/ solutions in hand so we can verify exactly what you are taking. This includes all medications from all doctors and over the counters      12/05/2020  f/u ov/Cherika Jessie re:  COPD / still smoking / breztri  Chief Complaint  Patient presents with  . Follow-up    Breathing is doing better since the last visit. She has been wheezing more over the past few wks and has had minimal cough-non prod. She is using her albuterol inhaler 4 x per day.   Dyspnea:  No change doe =across the room  Cough: not much Sleeping: bed is flat, one pillow  SABA use: way  too much/ very poor insight about how/when to use   02: none  Covid status:  vax x 3  rec Plan A = Automatic = Always=    breztri Take 2 puffs first thing in am and then another 2 puffs about 12 hours later.  Work on inhaler technique:  Plan B = Backup (to supplement plan A, not to replace it) Only use your albuterol inhaler as a rescue medication  Plan C = Crisis (instead of Plan B but only if Plan B stops working) - only use your albuterol nebulizer if you first try Plan B a  key is to stop smoking completely before smoking completely stops you! PFT's on return / has #16 on breztri sample, given 4 more weeks Please schedule a follow up office visit in 4 weeks, call sooner if needed with all medications /inhalers/ solutions in hand so we can verify exactly what you are taking. This includes all medications from all doctors and over the [place] separate them into two bags:  the ones you take automatically, no matter what, vs the ones you take just when you feel you need them "BAG #2 is  UP TO YOU"  - this will really help [place] help you take your medications more effectively.    01/02/2021  f/u ov/Abanoub Hanken re: copd 0, brought no meds, not clear using breztri 2bid  Chief Complaint  Patient presents with  . Follow-up    PFT done today. Breathing is doing well today. She still has some wheezing. She is using her rescue inhaler about every 4 hours.   Dyspnea:  Room to room about same on vs off breztri /no change in saba dependency Cough: none  Sleeping: flat bed/ one pillow  SABA use: way too much hfa and neb / never pre or rechallenges  02: none  Covid status:   vax x3    No obvious day to day or daytime variability or assoc excess/ purulent sputum or mucus plugs or hemoptysis or cp or chest tightness, subjective wheeze or overt sinus or hb symptoms.   Sleeping  without nocturnal  or early am exacerbation  of respiratory  c/o's or need for noct saba. Also denies any obvious fluctuation of symptoms with weather or environmental changes or other aggravating or alleviating factors except as outlined above   No unusual exposure hx or h/o childhood pna/ asthma or knowledge of premature birth.  Current Allergies, Complete Past Medical History, Past Surgical History, Family History, and Social History were reviewed in [employer] record.  ROS  The following are not active complaints unless bolded Hoarseness, sore throat, dysphagia, dental problems, itching, sneezing,  nasal congestion or discharge of excess mucus or purulent secretions, ear ache,   fever, chills, sweats, unintended wt loss or wt gain, classically pleuritic or exertional cp,  orthopnea pnd or arm/hand swelling  or leg swelling, presyncope, palpitations, abdominal pain, anorexia, nausea, vomiting, diarrhea  or change in bowel habits or change in bladder habits, change in stools or change in urine, dysuria, hematuria,  rash, arthralgias, visual complaints, headache, numbness, weakness or ataxia or  problems with walking or coordination,  change in mood or  memory.        Current Meds  Medication Sig  . acetaminophen (TYLENOL) 325 MG tablet Take 2 tablets (650 mg total) by mouth every 6 (six) hours as needed for mild pain, moderate pain or fever (or Fever >/= 101).  Marland Kitchen albuterol (VENTOLIN HFA) 108 (90  Base) MCG/ACT inhaler Inhale 2 puffs into the lungs every 4 (four) hours as needed for wheezing or shortness of breath.   Marland Kitchen amLODipine (NORVASC) 5 MG tablet Take 7.5 mg by mouth at bedtime.  Marland Kitchen aspirin 81 MG EC tablet Take 1 tablet (81 mg total) by mouth daily.  . Budeson-Glycopyrrol-Formoterol (BREZTRI AEROSPHERE) 160-9-4.8 MCG/ACT AERO Inhale 2 puffs into the lungs 2 (two) times daily.  . carvedilol (COREG) 12.5 MG tablet Take 12.5 mg by mouth 2 (two) times daily.  . cloNIDine (CATAPRES) 0.2 MG tablet Take 0.2 mg by mouth at bedtime.  . clopidogrel (PLAVIX) 75 MG tablet Take 1 tablet (75 mg total) by mouth daily.  . diclofenac Sodium (VOLTAREN) 1 % GEL Apply 2 g topically 4 (four) times daily as needed (pain).   Marland Kitchen dicyclomine (BENTYL) 20 MG tablet Take 20 mg by mouth 3 (three) times daily.  Marland Kitchen donepezil (ARICEPT) 5 MG tablet Take 1 tablet (5 mg total) by mouth daily.  . DULoxetine (CYMBALTA) 30 MG capsule Take 30 mg by mouth daily.  . feeding supplement, ENSURE ENLIVE, (ENSURE ENLIVE) LIQD Take 237 mLs by mouth 2 (two) times daily between meals.  . Ferrous Sulfate (IRON HIGH-POTENCY) 325 MG TABS Take 325 mg by mouth daily with breakfast.  . gabapentin (NEURONTIN) 100 MG capsule Take 1 tablet in morning, 1 tablet at noon, 2 tablets at night  . hydrOXYzine (ATARAX/VISTARIL) 25 MG tablet Take 25 mg by mouth at bedtime as needed (sleep).   . magnesium oxide (MAG-OX) 400 MG tablet Take 400 mg by mouth 2 (two) times daily.  . megestrol (MEGACE) 40 MG tablet Take 40 mg by mouth 2 (two) times daily.  . methocarbamol (ROBAXIN) 500 MG tablet Take 500 mg by mouth 3 (three) times daily as needed (back  spasms).   . Multiple Vitamin (MULTIVITAMIN WITH MINERALS) TABS tablet Take 1 tablet by mouth daily.  . nitroGLYCERIN (NITROSTAT) 0.4 MG SL tablet Place 1 tablet (0.4 mg total) under the tongue every 5 (five) minutes as needed for chest pain.  Marland Kitchen ondansetron (ZOFRAN ODT) 4 MG disintegrating tablet Take 1 tablet (4 mg total) by mouth every 4 (four) hours as needed for nausea or vomiting.  . pantoprazole (PROTONIX) 40 MG tablet Take 1 tablet (40 mg total) by mouth daily.  . rosuvastatin (CRESTOR) 40 MG tablet Take 1 tablet (40 mg total) by mouth daily.  . sodium bicarbonate 650 MG tablet Take 2 tablets (1,300 mg total) by mouth 2 (two) times daily. (Patient taking differently: Take 650 mg by mouth daily.)  . Vitamin D, Ergocalciferol, (DRISDOL) 1.25 MG (50000 UNIT) CAPS capsule Take 50,000 Units by mouth every Monday.            Past Medical History:  Diagnosis Date  . Acute respiratory failure with hypoxia ([place]) 11/01/2019  . Anxiety   . Arthritis   . Chest pain   . Chronic kidney disease   . Dyspnea   . Elevated troponin 12/13/2018  . Headache(784.0)   . Hyperlipidemia   . Hypertension   . Joint pain   . Leg pain   . Peripheral neuropathy   . Peripheral vascular disease (HCC)       Objective:    01/02/2021        108 12/05/2020        109   11/07/20 103 lb (46.7 kg)  10/17/20 99 lb 6.4 oz (45.1 kg)  10/02/20 100 lb 12 oz (45.7 kg)  Vital signs reviewed  01/02/2021  - Note at rest 02 sats  100% on RA   General appearance:    Chronically ill thin bf nad      HEENT : pt wearing mask not removed for exam due to covid -19 concerns.    NECK :  without JVD/Nodes/TM/ nl carotid upstrokes bilaterally   LUNGS: no acc muscle use,  Mod barrel  contour chest wall with bilateral  Distant bs s audible wheeze and  without cough on insp or exp maneuvers and mod  Hyperresonant  to  percussion bilaterally     CV:  RRR  no s3 or murmur or increase in P2, and pitting edema L >  R    ABD:  soft and nontender with pos mid insp Hoover's  in the supine position. No bruits or organomegaly appreciated, bowel sounds nl  MS:     ext warm without deformities, calf tenderness, cyanosis or clubbing No obvious joint restrictions   SKIN: warm and dry without lesions    NEURO:  alert, approp, nl sensorium with  no motor or cerebellar deficits apparent.        I personally reviewed images and agree with radiology impression as follows:  CXR:   12/05/20 pa and lat  The heart size borderline. There is a tortuous thoracic aorta. The hila and mediastinum are unchanged. No pneumothorax. No nodules or masses. No focal infiltrates >>>No active cardiopulmonary disease.     Assessment

## 2021-01-02 NOTE — Patient Instructions (Addendum)
Try albuterol (one day the inhaler the next day the neb and the next day nothing) use 15 min before an activity that you know would make you short of breath and see if it makes any difference and if makes none then don't take it after activity unless you can't catch your breath.  Stop breztri and see if breathing gets worse or need more  albuterol and restart if it does @ Take 2 puffs first thing in am and then another 2 puffs about 12 hours later.   The key is to stop smoking completely before smoking completely stops you!   Pulmonary follow is as needed  -  you must bring all medication/ nebulizer / inhalers with you that you are actually using

## 2021-01-02 NOTE — Progress Notes (Signed)
D/w pt at [facility] 01/02/21

## 2021-02-09 IMAGING — CT CT HEAD W/O CM
4 series · 16 of 47 positions shown, 18 images · non-contrast
Comparison: September 19, 2019

CLINICAL DATA: Head trauma. Suspect intracranial arterial injury.

EXAM:
CT HEAD WITHOUT CONTRAST
TECHNIQUE: Contiguous axial images were obtained from the base of the skull
through the vertex without intravenous contrast.

[Series 3: head without · axial · non-contrast · 0.39mm/px · z∈[-80,+35]mm · 7 of 31 slices shown, 9 images]
[im 4/31  brain]
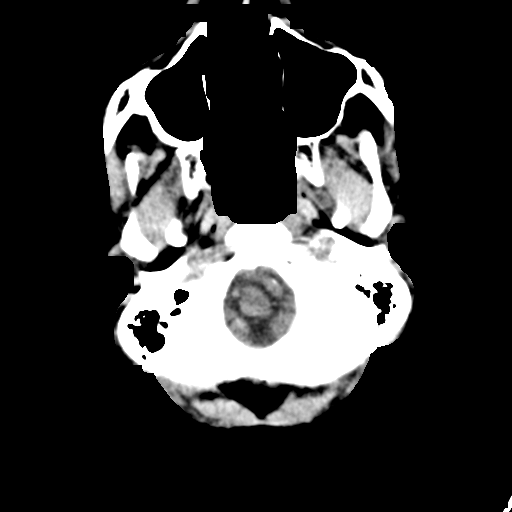
[im 4/31  bone]
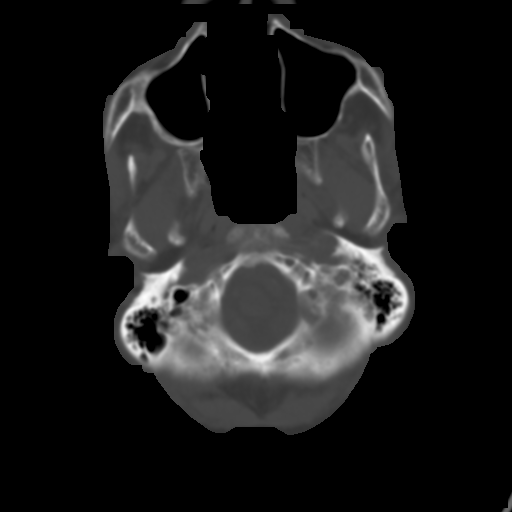
[im 8/31  brain]
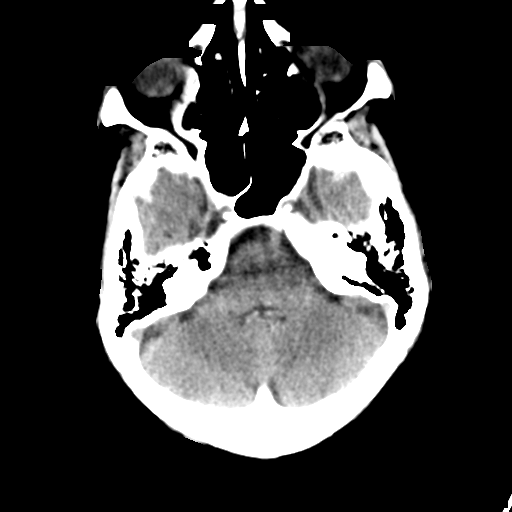
[im 12/31  brain]
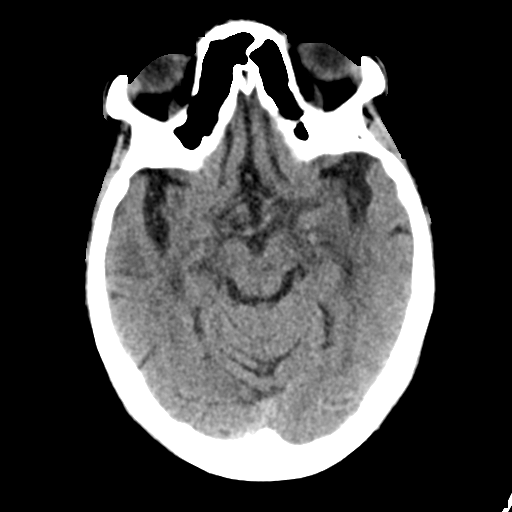
[im 16/31  brain]
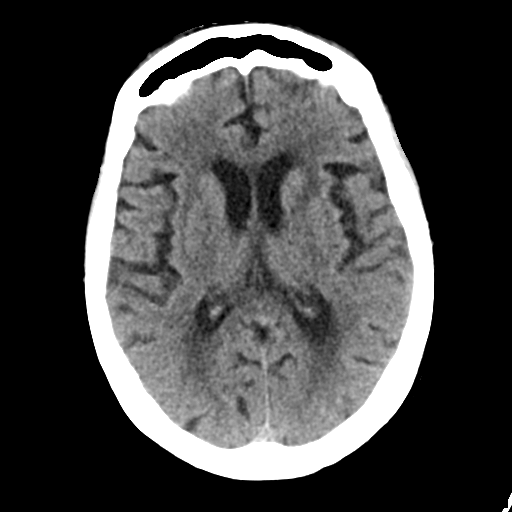
[im 19/31  brain]
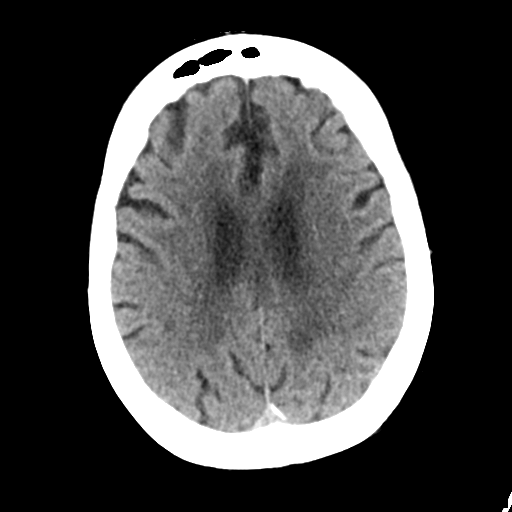
[im 19/31  bone]
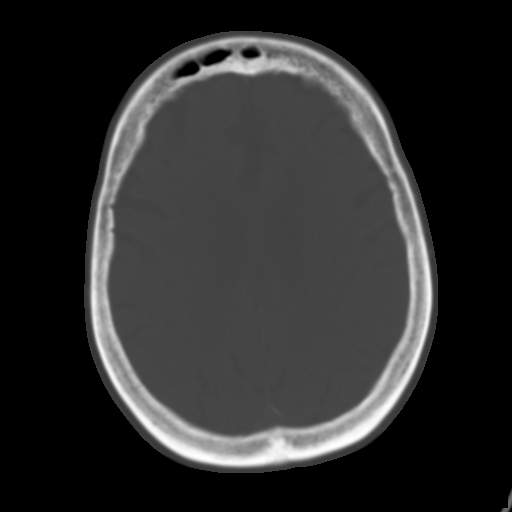
[im 23/31  brain]
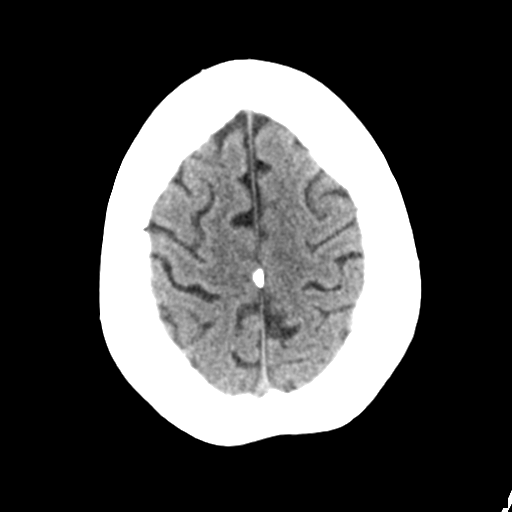
[im 27/31  brain]
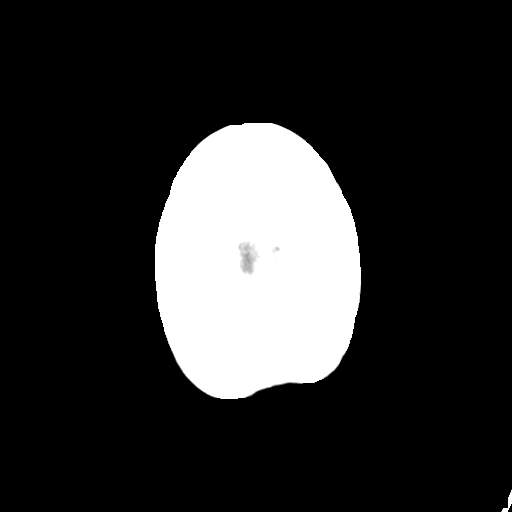

[Series 4: head bone · axial · 0.39mm/px · z∈[-81,-51]mm · 3 of 77 slices shown]
[im 8/77  bone]
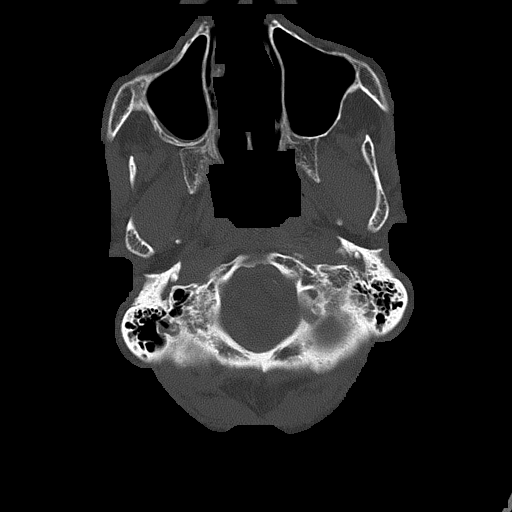
[im 16/77  bone]
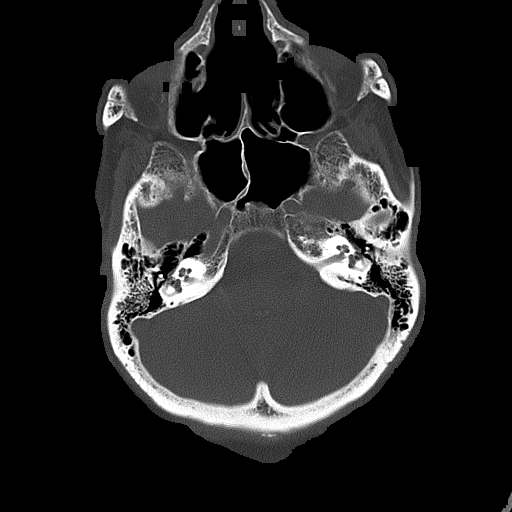
[im 23/77  bone]
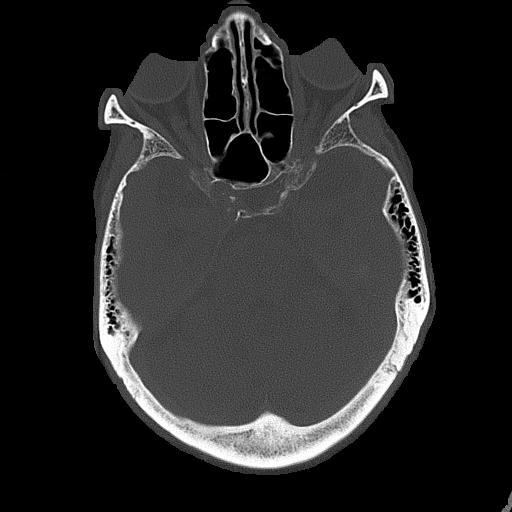

[Series 5: head without cor · coronal · non-contrast · 0.31mm/px · 3 of 67 slices shown]
[im 24/67  brain]
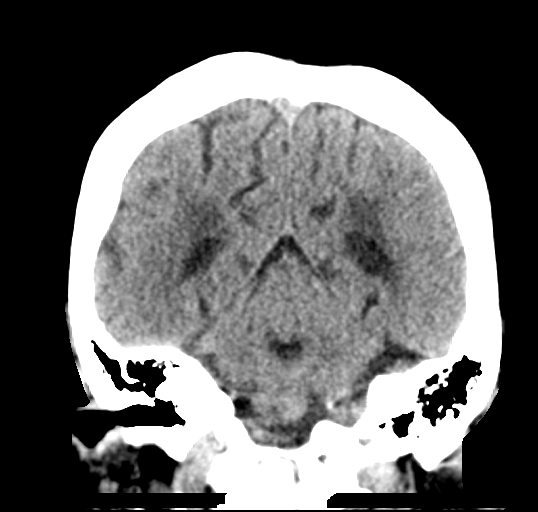
[im 30/67  brain]
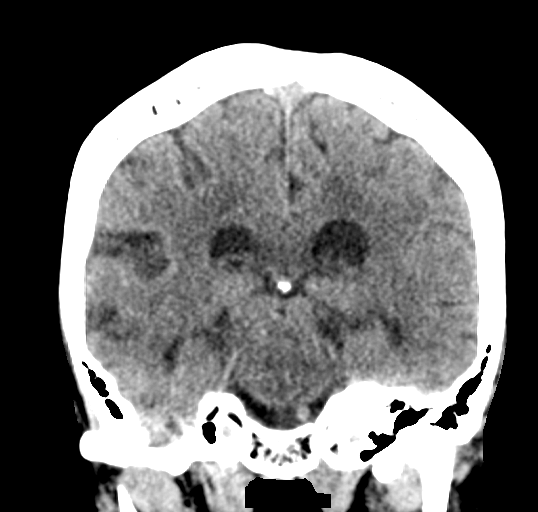
[im 37/67  brain]
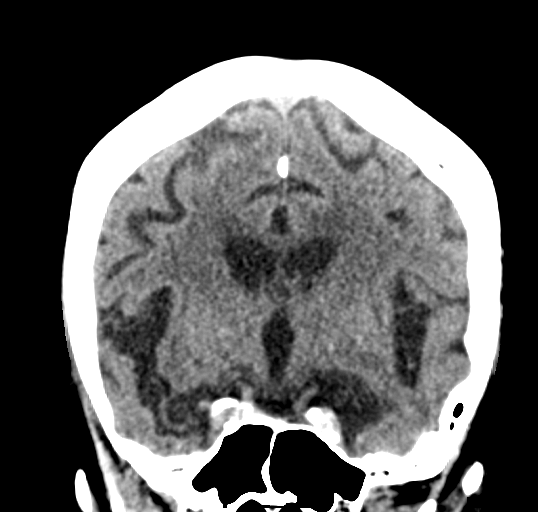

[Series 6: head without sag · sagittal · non-contrast · 0.31mm/px · 3 of 50 slices shown]
[im 17/50  brain]
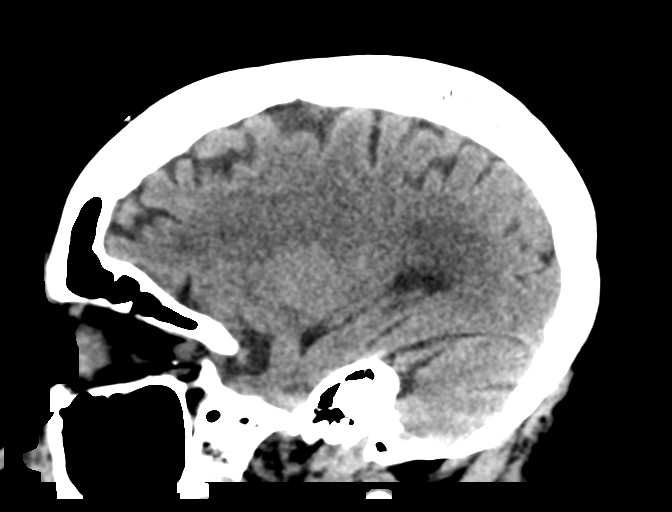
[im 25/50  brain]
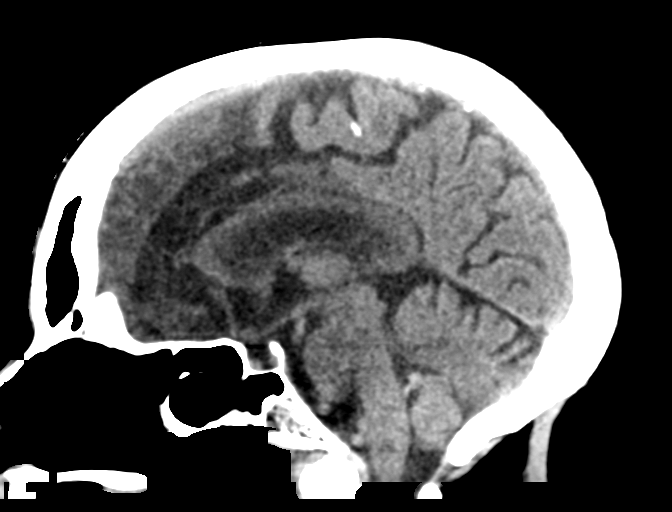
[im 33/50  brain]
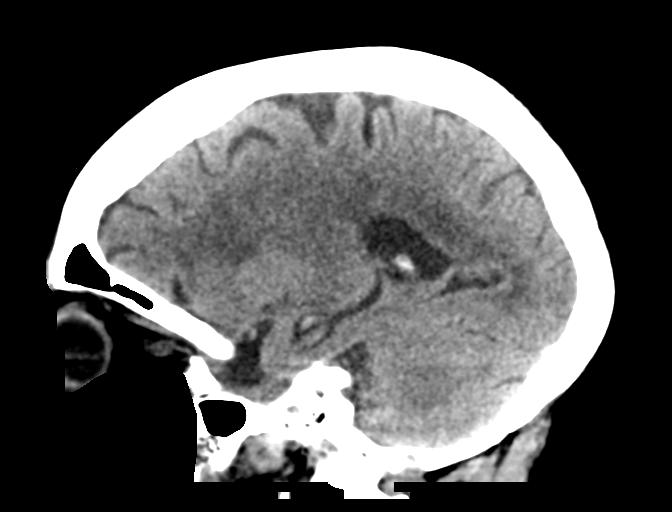

[16 of 47 positions shown; findings below may reference images not displayed]

FINDINGS: Brain: No evidence of acute infarction, hemorrhage, hydrocephalus,
extra-axial collection or mass lesion/mass effect. Empty sella.
Chronic white matter microvascular ischemic changes.

Vascular: No hyperdense vessel. Atherosclerosis of the bilateral
carotid siphons.

Skull: Demineralized. No skull fracture. Stable small benign dural
calcification.

Sinuses/Orbits: Mild paranasal sinus mucosal thickening. Normal
orbital soft tissues.

Other: Congenital non fusion of the C1 posterior arch. Cerumen in
the left external auditory canal. Trace bilateral mastoid fluid.
IMPRESSION: No CT evidence of acute intracranial abnormality.

## 2021-02-09 IMAGING — CT CT CERVICAL SPINE W/O CM
4 series · 15 of 33 positions shown, 18 images · non-contrast
Comparison: 09/19/2019

CLINICAL DATA: Syncope.  Chest pain.

EXAM:
CT CERVICAL SPINE WITHOUT CONTRAST
TECHNIQUE: Multidetector CT imaging of the cervical spine was performed without
intravenous contrast. Multiplanar CT image reconstructions were also
generated.

[Series 4: c_spine 2.0 st · axial · 0.33mm/px · z∈[-178,-74]mm · 5 of 79 slices shown, 7 images]
[im 14/79  soft-tissue]
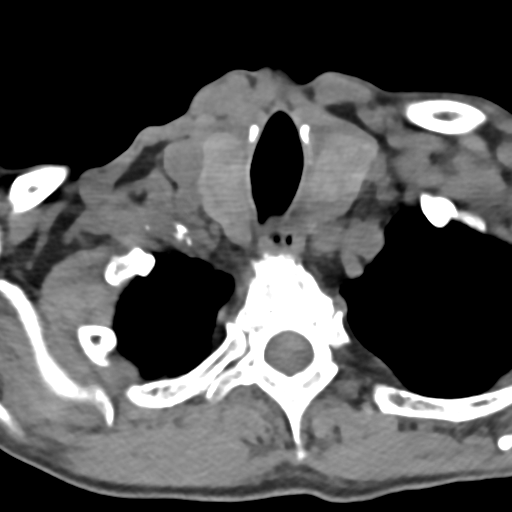
[im 14/79  bone]
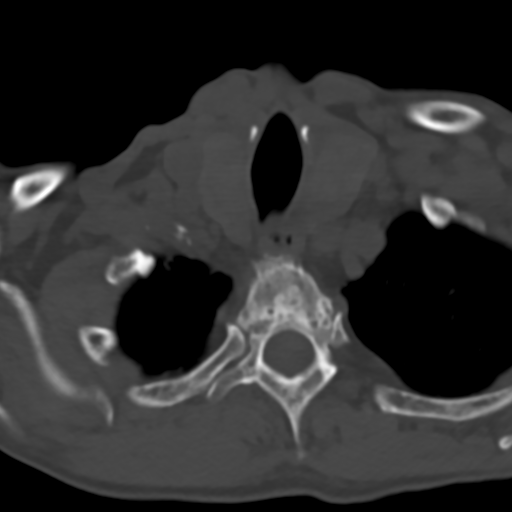
[im 27/79  bone]
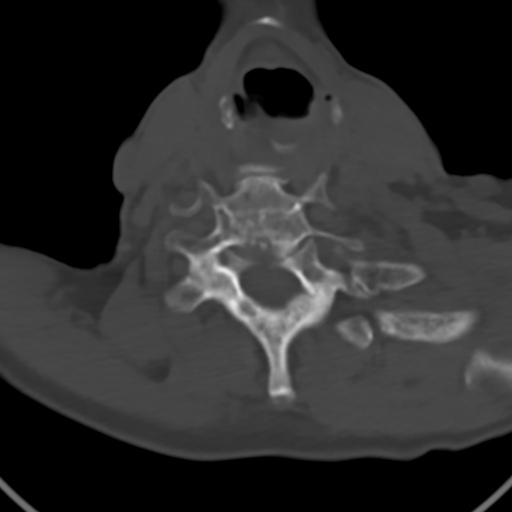
[im 40/79  bone]
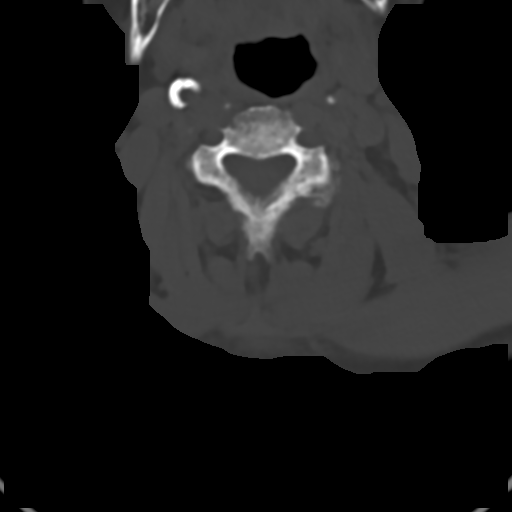
[im 53/79  bone]
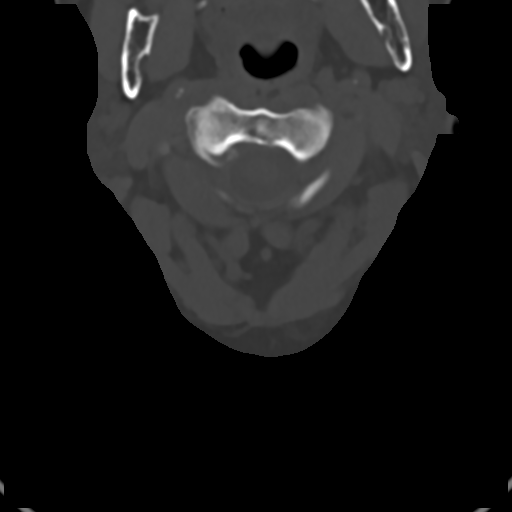
[im 66/79  soft-tissue]
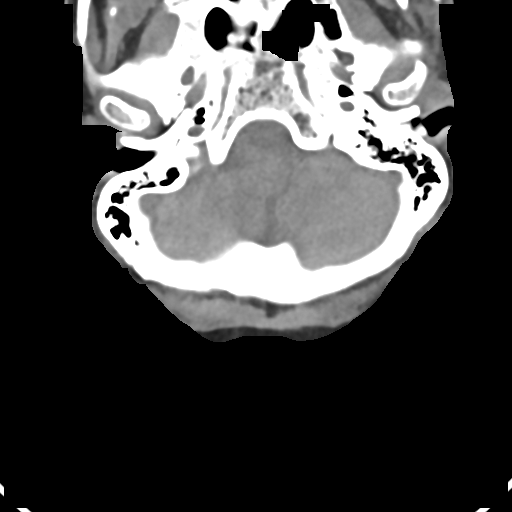
[im 66/79  bone]
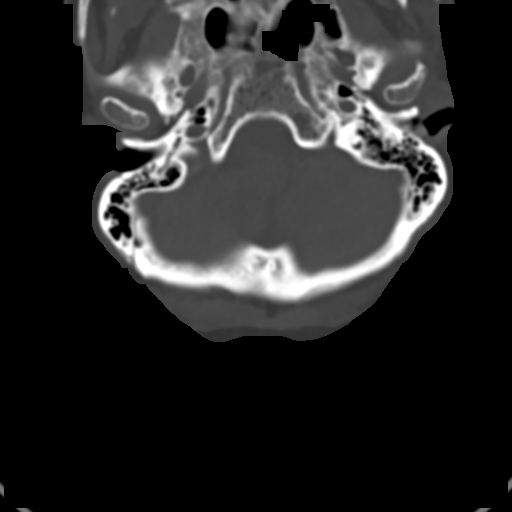

[Series 6: c_spine 2.0 sag bone · sagittal · 0.35mm/px · 5 of 61 slices shown, 6 images]
[im 21/61  bone]
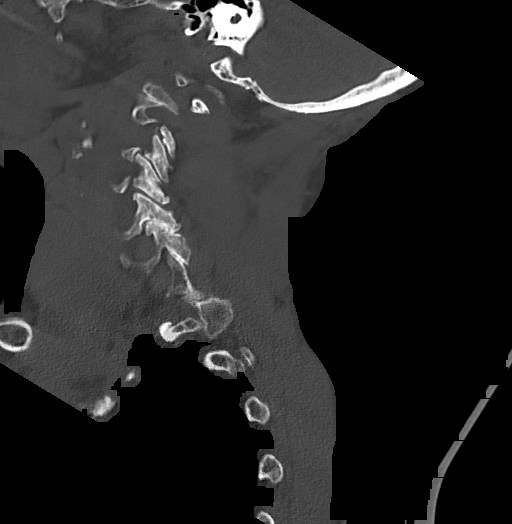
[im 26/61  bone]
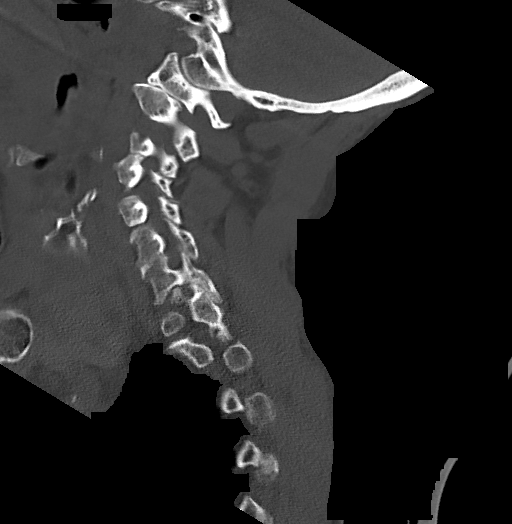
[im 31/61  soft-tissue]
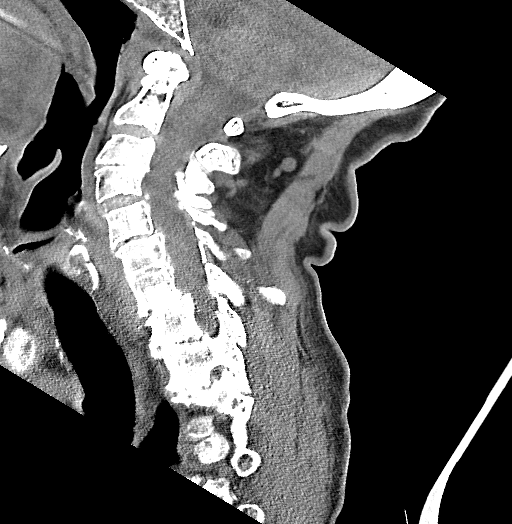
[im 31/61  bone]
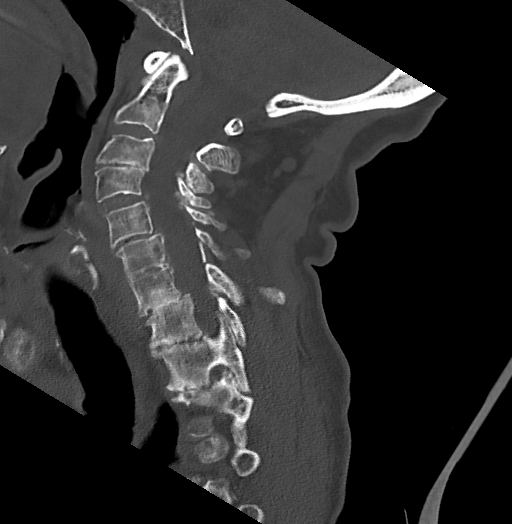
[im 36/61  bone]
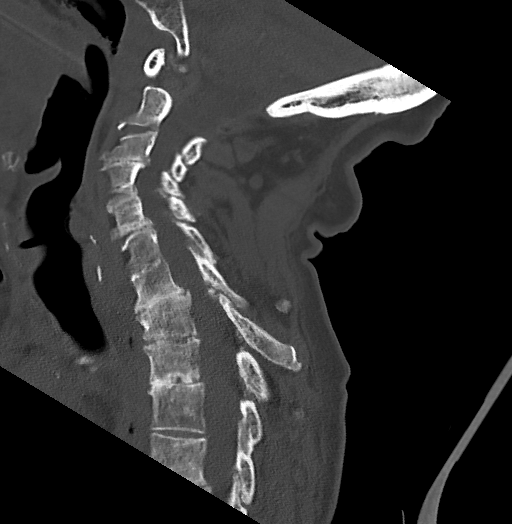
[im 41/61  bone]
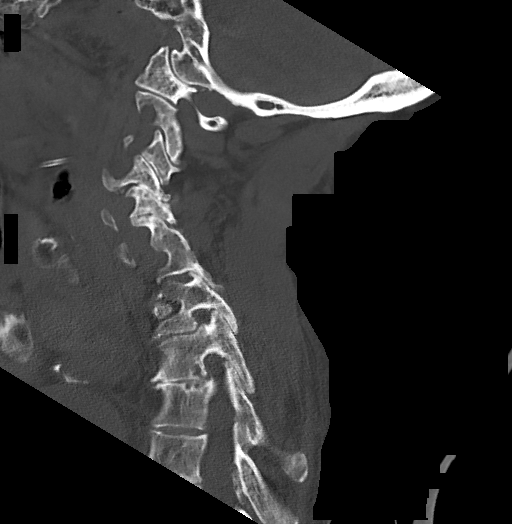

[Series 7: c_spine 2.0 cor bone · coronal · 0.25mm/px · 3 of 61 slices shown]
[im 13/61  bone]
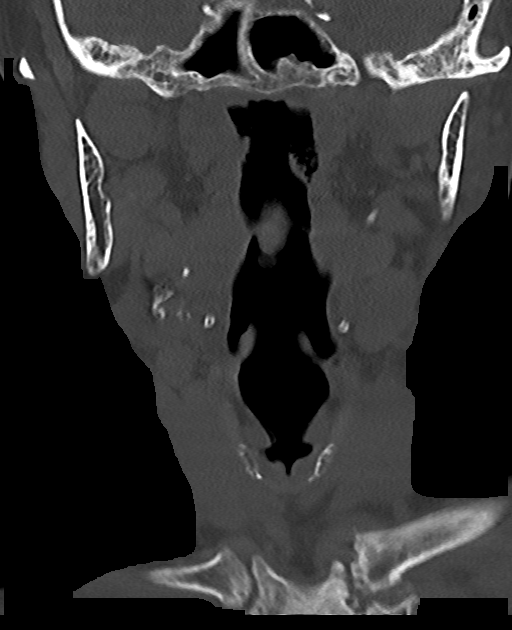
[im 25/61  bone]
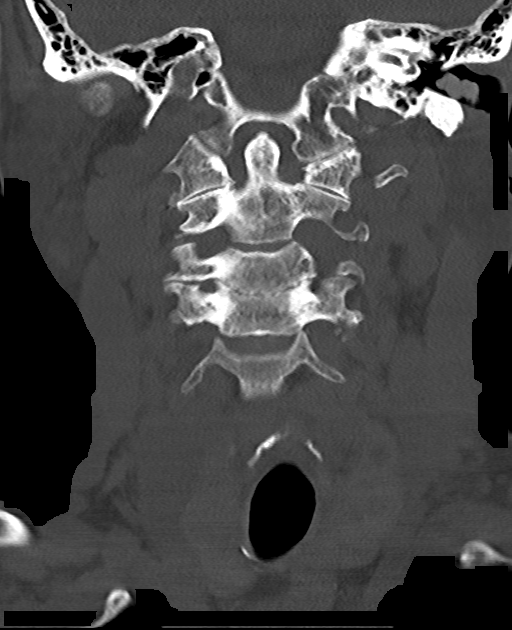
[im 37/61  bone]
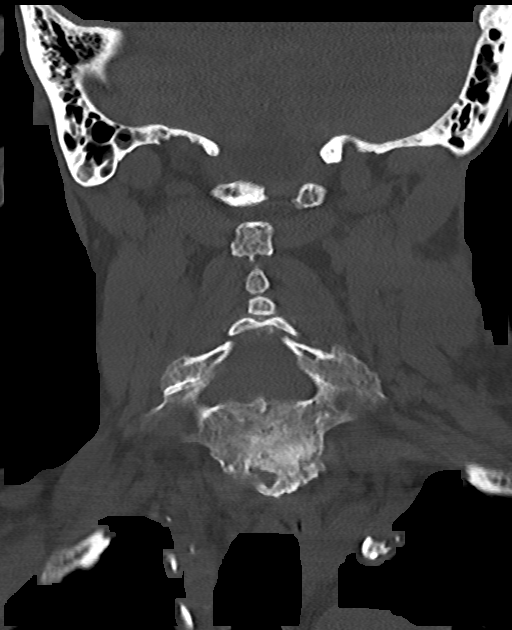

[Series 8: c_spine 2.0 orthogonals · axial · 0.21mm/px · z∈[-181,-167]mm · 2 of 73 slices shown]
[im 13/73  bone]
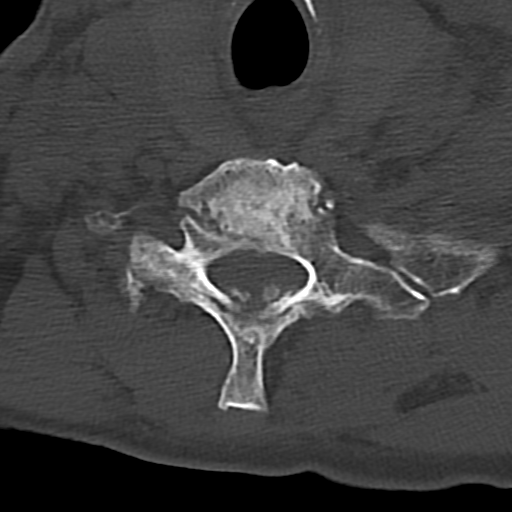
[im 25/73  bone]
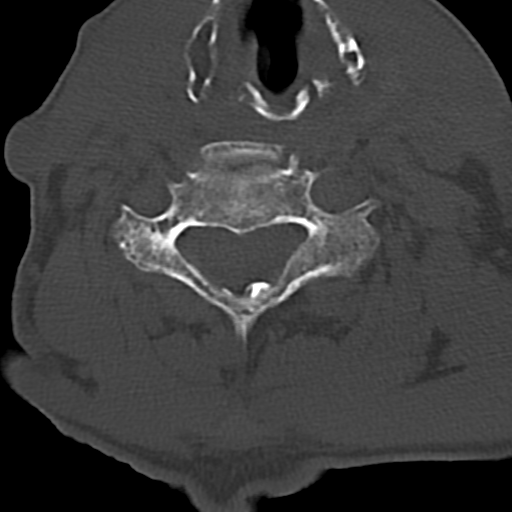

[15 of 33 positions shown; findings below may reference images not displayed]

FINDINGS: Alignment: Grade 1 anterolisthesis, C4 on C5 and C7 on T1, both
degenerative. No traumatic subluxation.

Skull base and vertebrae: No acute fracture. No primary bone lesion
or focal pathologic process.

Soft tissues and spinal canal: No prevertebral fluid or swelling. No
visible canal hematoma.

Disc levels: There are significant degenerative changes throughout
the cervical spine with marked loss of disc height at C3-C4,
moderate loss of disc height C5-C6 and C6-C7 and marked loss of disc
height C7-T1. There are bilateral facet degenerative changes. No
convincing disc herniation.

Upper chest: No acute findings. Lung apices show emphysema. Is mild
enlargement of the thyroid gland with small hypo attenuation areas
consistent nodules, stable. No specific imaging follow-up
recommended.

Other: None.
IMPRESSION: 1. No fracture or acute finding.
2. Advanced degenerative changes which are unchanged from the prior
cervical CT.

## 2021-02-09 IMAGING — CT CT CHEST W/O CM
2 of 4 series · 12 of 36 positions shown, 15 images · IV contrast (Omni 300)
Comparison: None.

CLINICAL DATA: Syncope. Back pain. Patient cannot tolerate
intravenous contrast.

EXAM:
CT CHEST, ABDOMEN AND PELVIS WITHOUT CONTRAST
TECHNIQUE: Multidetector CT imaging of the chest, abdomen and pelvis was
performed following the standard protocol without IV contrast.

[Series 3: cap with · axial · 0.63mm/px · z∈[-668,-168]mm · 9 of 122 slices shown, 12 images]
[im 11/122  mediastinal]
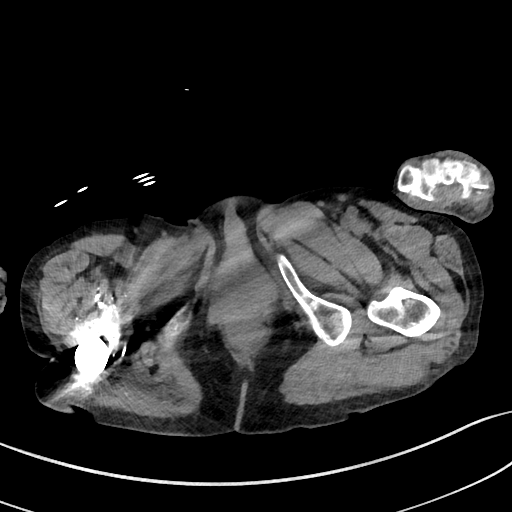
[im 11/122  lung]
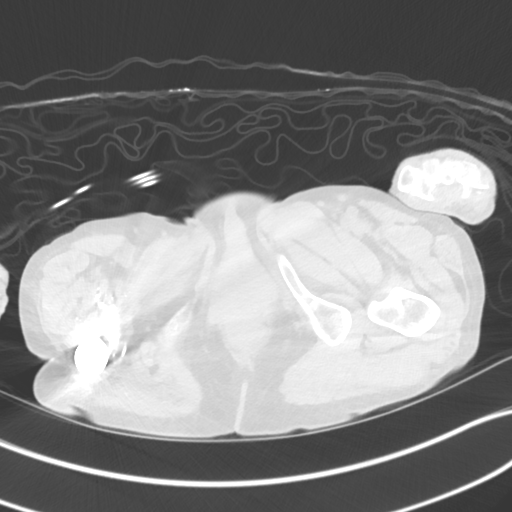
[im 21/122  lung]
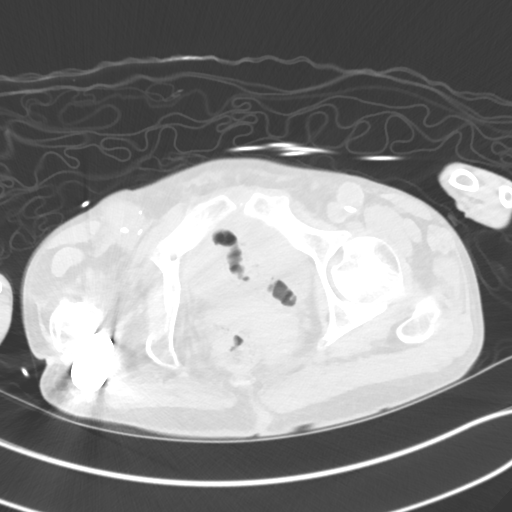
[im 41/122  lung]
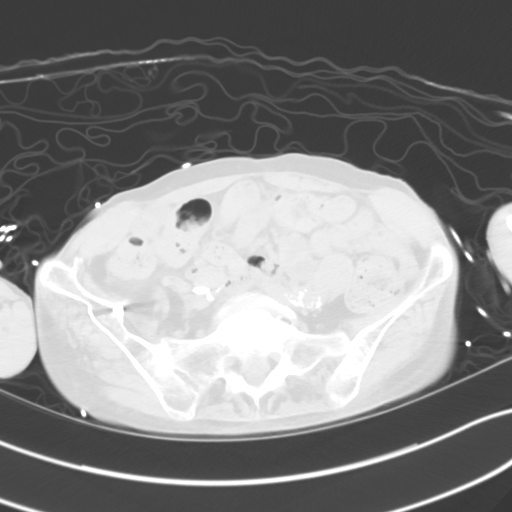
[im 51/122  lung]
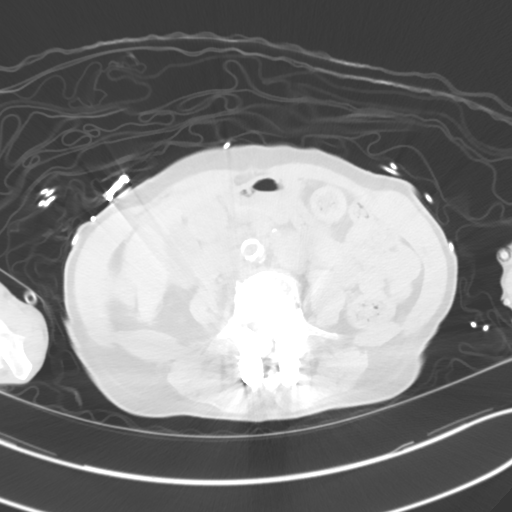
[im 61/122  mediastinal]
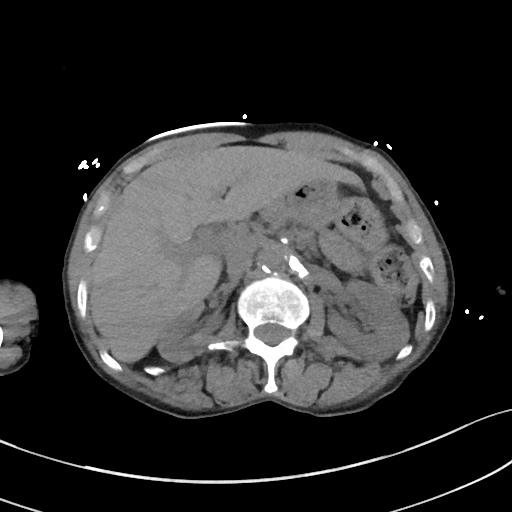
[im 61/122  lung]
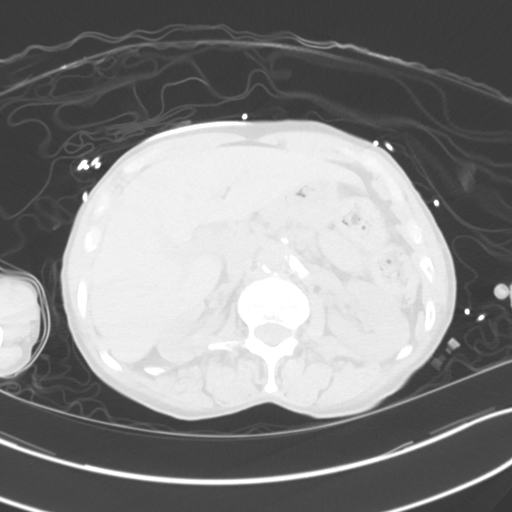
[im 71/122  lung]
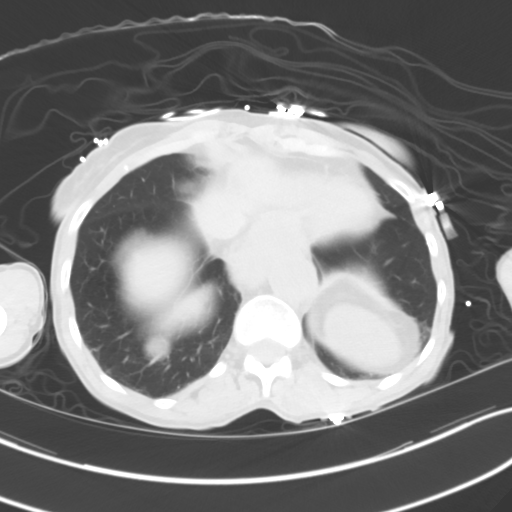
[im 81/122  lung]
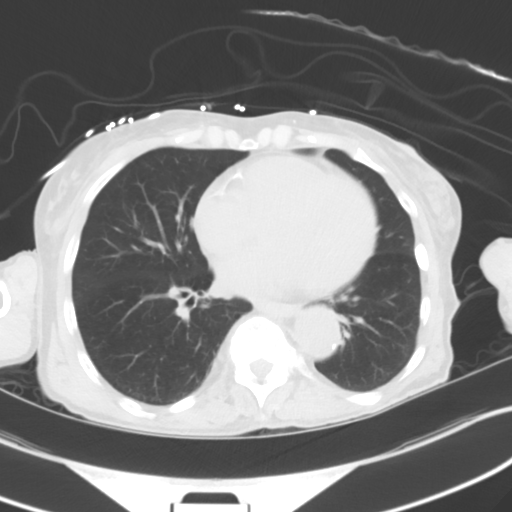
[im 101/122  lung]
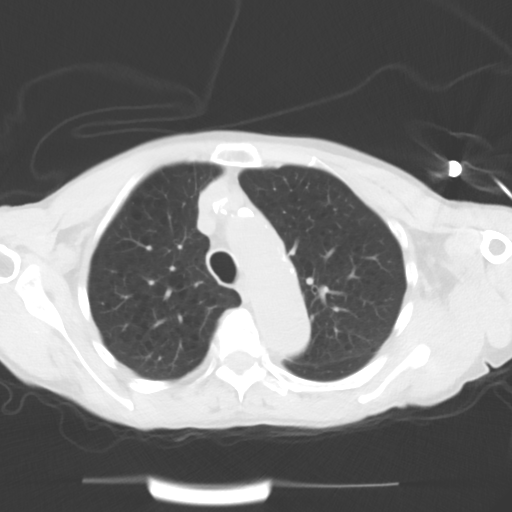
[im 111/122  mediastinal]
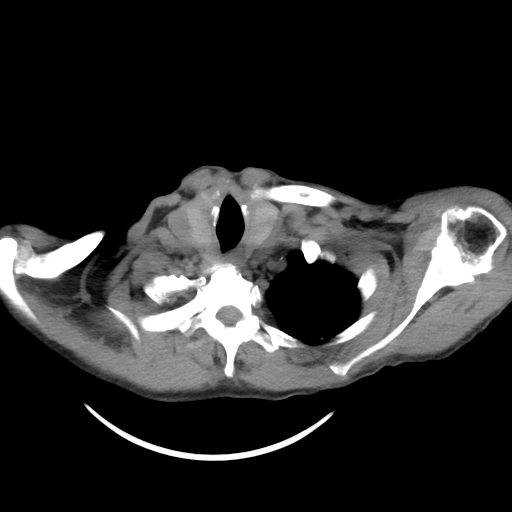
[im 111/122  lung]
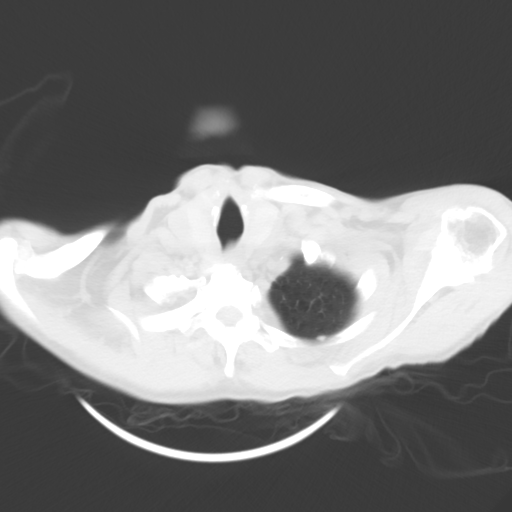

[Series 5: cap with 3.0 mm st cor · coronal · 0.68mm/px · 3 of 77 slices shown]
[im 16/77  lung]
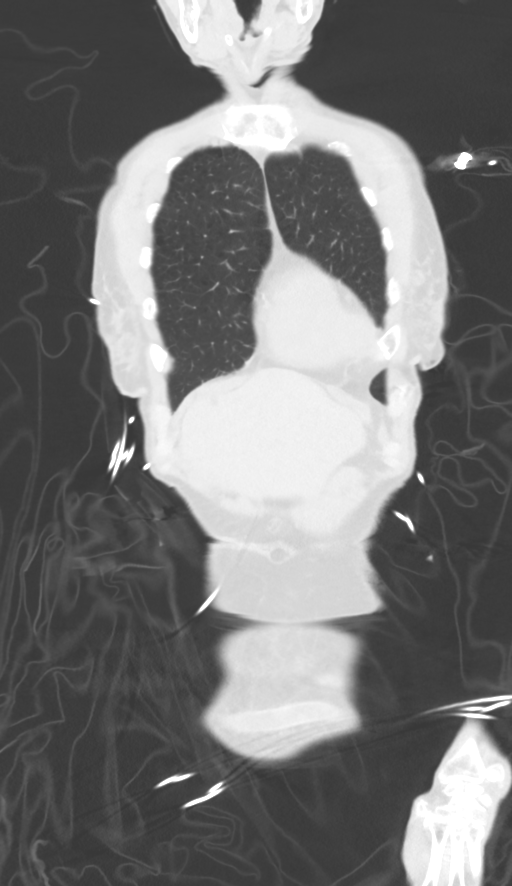
[im 31/77  lung]
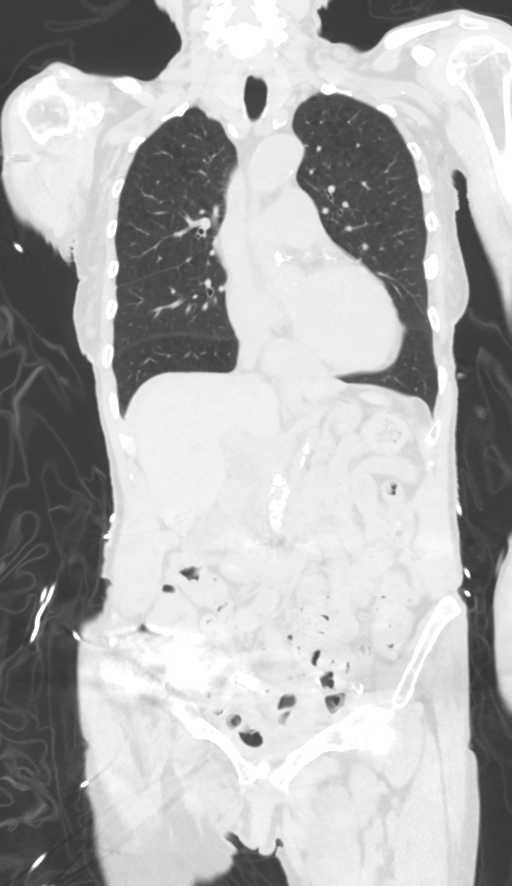
[im 46/77  lung]
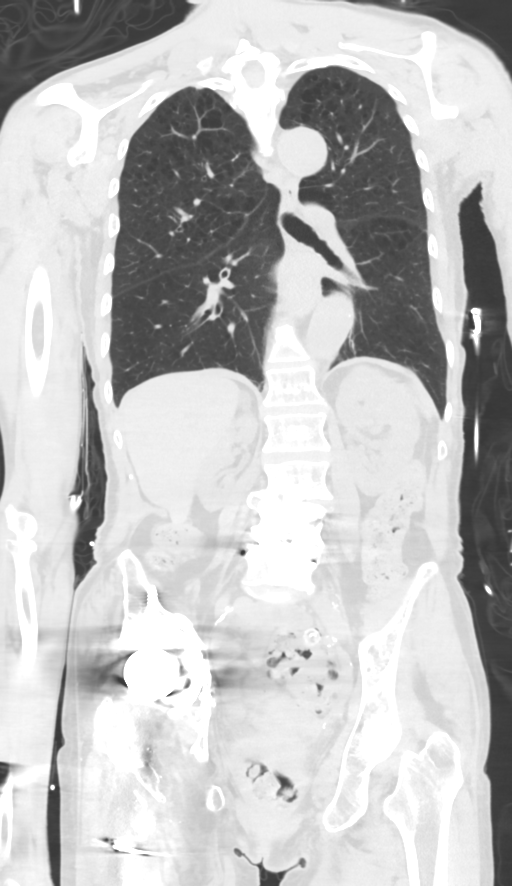

[12 of 36 positions shown; findings below may reference images not displayed]

FINDINGS: CT CHEST FINDINGS

Cardiovascular: Heart normal in size. Three-vessel coronary artery
calcifications. No pericardial effusion.

Aortic atherosclerotic calcifications. No displacement of
calcifications away from the aortic wall to suggest a dissection.
Atherosclerotic calcifications extend into the aortic arch branch
vessels.

Mediastinum/Nodes: No neck base, mediastinal or hilar masses or
enlarged lymph nodes. Trachea and esophagus are unremarkable.
Moderate hiatal hernia.

Lungs/Pleura: Minor dependent lung base atelectasis. Moderate
centrilobular emphysema. No evidence of pneumonia or pulmonary
edema. No mass or suspicious nodule. No pleural effusion or
pneumothorax.

Musculoskeletal: No fracture or acute finding. No aggressive bone
lesion. Significant degenerative changes throughout the thoracic
spine. Arthropathic changes of both shoulders, a pattern consistent
with an inflammatory arthropathy.

CT ABDOMEN PELVIS FINDINGS

Hepatobiliary: No focal liver abnormality is seen. No gallstones,
gallbladder wall thickening, or biliary dilatation.

Pancreas: Unremarkable. No pancreatic ductal dilatation or
surrounding inflammatory changes.

Spleen: Normal in size without focal abnormality.

Adrenals/Urinary Tract: No adrenal masses.

Right renal cortical thinning with a reduced right kidney size.
Normal size of the left kidney. No renal masses. No collecting
system stones. No hydronephrosis. Ureters not well defined, but
grossly normal in course and caliber. Bladder is decompressed,
otherwise unremarkable.

Stomach/Bowel: Moderate hiatal hernia. Stomach otherwise
unremarkable. Small bowel and colon are normal in caliber. No wall
thickening or inflammation. Moderate increased colonic stool. Left
colon diverticula. No diverticulitis. Normal appendix.

Vascular/Lymphatic: Diffuse aortic atherosclerosis extending into
the branch vessels. 1.9 cm aneurysm of the common femoral artery on
the right where there is a bifemoral artery graft anastomosis.

Reproductive: Densely calcified small uterine fibroid. Otherwise
unremarkable.

Other: No hernia or ascites.

Musculoskeletal: No acute fracture. No aggressive bone lesion.
Chronic changes from a right total hip arthroplasty. Previous L4-L5
lumbar spine fusion. Degenerative changes noted of the lumbar spine.
IMPRESSION: 1. No acute findings within the chest, abdomen or pelvis.
2. Exam limited for assessment of aortic dissection. Allowing for
this, there is no displacement of calcifications along the aortic
intima to suggest a dissection.
3. Moderate emphysema.
4. Right renal atrophy.
5. Colonic diverticula and moderate increased colonic stool burden.
No bowel inflammation.
6. Moderate hiatal hernia.
7. Diffuse aortic atherosclerosis.
8. No acute skeletal abnormality. Significant thoracolumbar
degenerative changes. Bilateral shoulder arthropathic changes
suggesting an inflammatory arthropathy such as rheumatoid arthritis.

## 2021-02-09 IMAGING — NM NM PULMONARY PERF PARTICULATE
8 series · 8 of 8 positions shown · non-contrast
Comparison: Chest radiograph dated 01/02/2020 and CT dated
01/02/2020

CLINICAL DATA: 78-year-old female with no pelvis in lung scan.

EXAM:
NUCLEAR MEDICINE PERFUSION LUNG SCAN
TECHNIQUE: Perfusion images were obtained in multiple projections after
intravenous injection of radiopharmaceutical.
Ventilation scans intentionally deferred if perfusion scan and chest
x-ray adequate for interpretation during COVID 19 epidemic.
RADIOPHARMACEUTICALS:  1.7 mCi Lc-CCm MAA IV

[Series 1: ant/post perf · 4.14mm/px · 1 of 1 slices shown (1 of 2)]
[im 1/1]
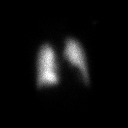

[Series 1: ant/post perf · 4.14mm/px · 1 of 1 slices shown (2 of 2)]
[im 1/1]
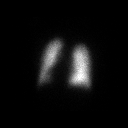

[Series 2: lao/rpo perf · 4.14mm/px · 1 of 1 slices shown (1 of 2)]
[im 1/1]
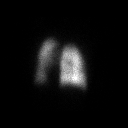

[Series 2: lao/rpo perf · 4.14mm/px · 1 of 1 slices shown (2 of 2)]
[im 1/1]
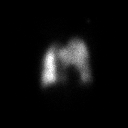

[Series 3: lpo/rao perf · 4.14mm/px · 1 of 1 slices shown (1 of 2)]
[im 1/1]
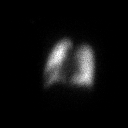

[Series 3: lpo/rao perf · 4.14mm/px · 1 of 1 slices shown (2 of 2)]
[im 1/1]
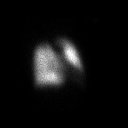

[Series 4: lt lat/rt lat perf · 4.14mm/px · 1 of 1 slices shown (1 of 2)]
[im 1/1]
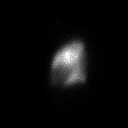

[Series 4: lt lat/rt lat perf · 4.14mm/px · 1 of 1 slices shown (2 of 2)]
[im 1/1]
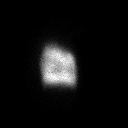

[8 of 8 positions shown; findings below may reference images not displayed]

FINDINGS: There is homogeneous and symmetric perfusion of the lungs. No
perfusion defect identified.
IMPRESSION: Normal perfusion lung scan.

## 2021-02-09 IMAGING — CR DG CHEST 2V
2 series · 2 of 2 positions shown · non-contrast
Comparison: 10/31/2019

CLINICAL DATA: Chest pain. Syncope.

EXAM:
CHEST - 2 VIEW

[chest lat]
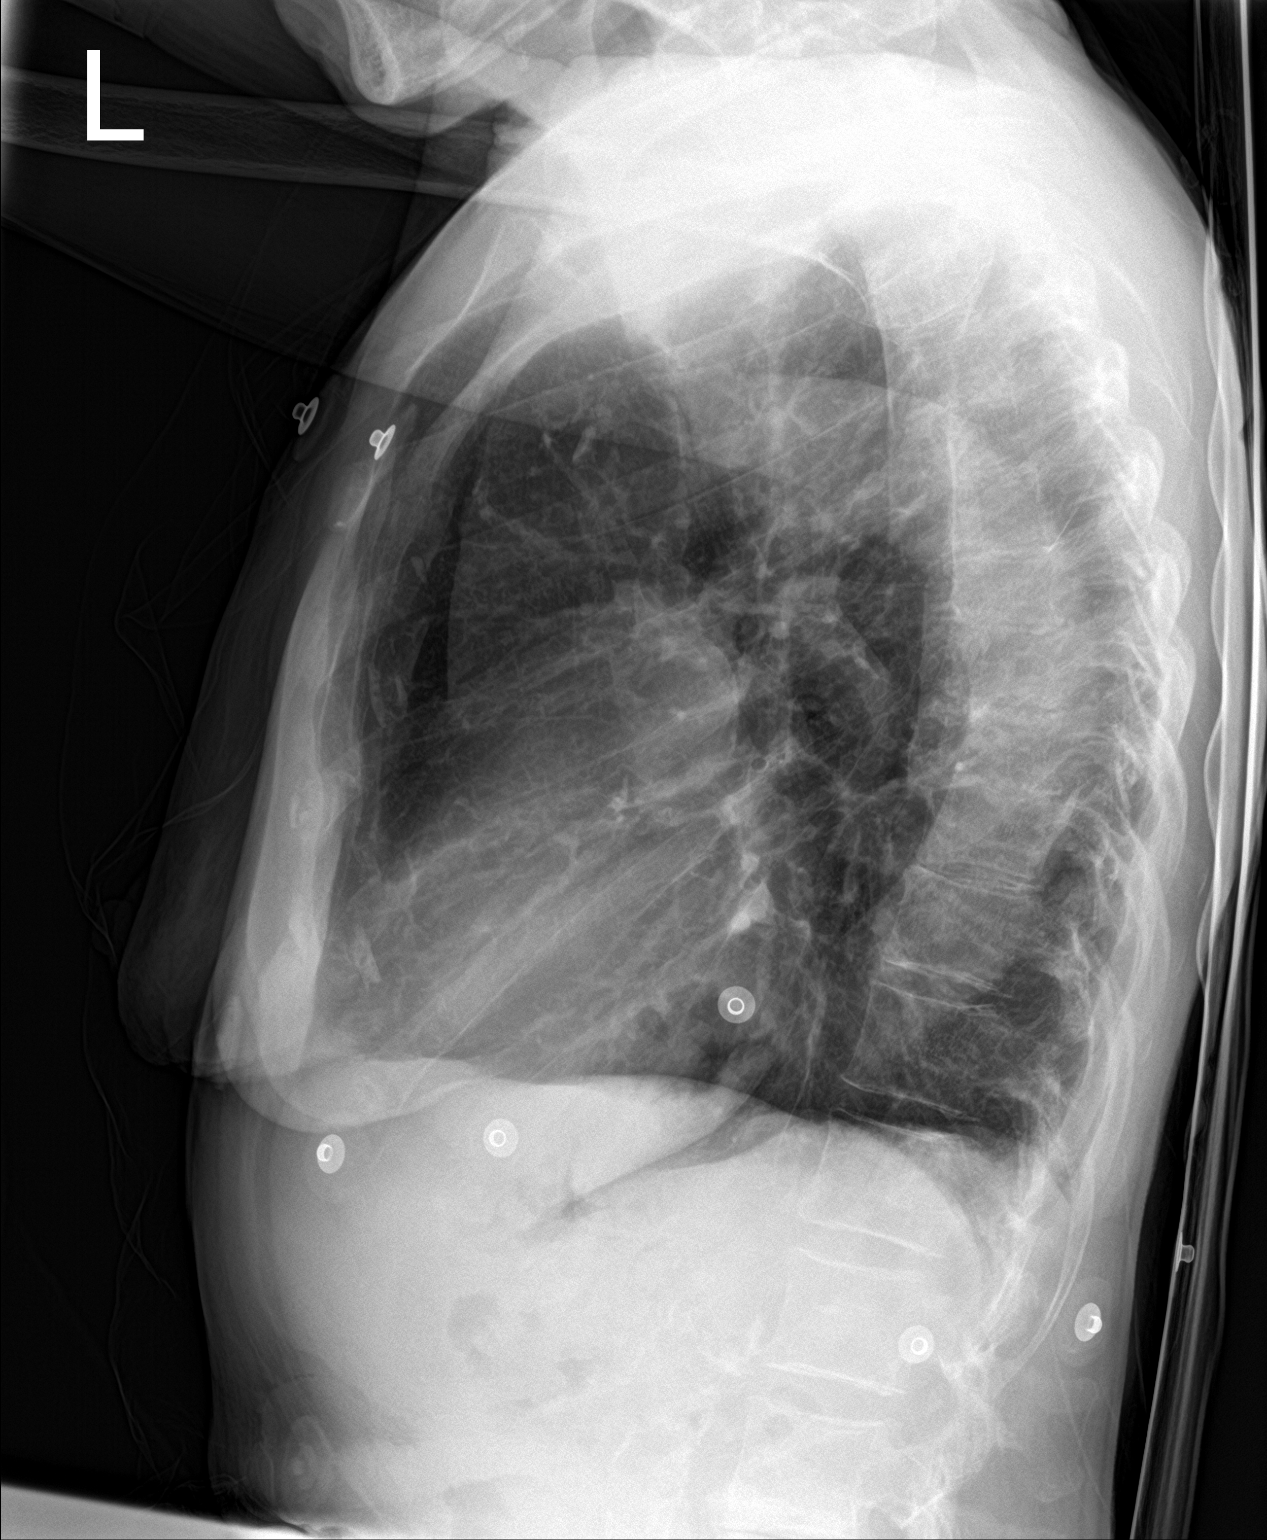

[chest ap]
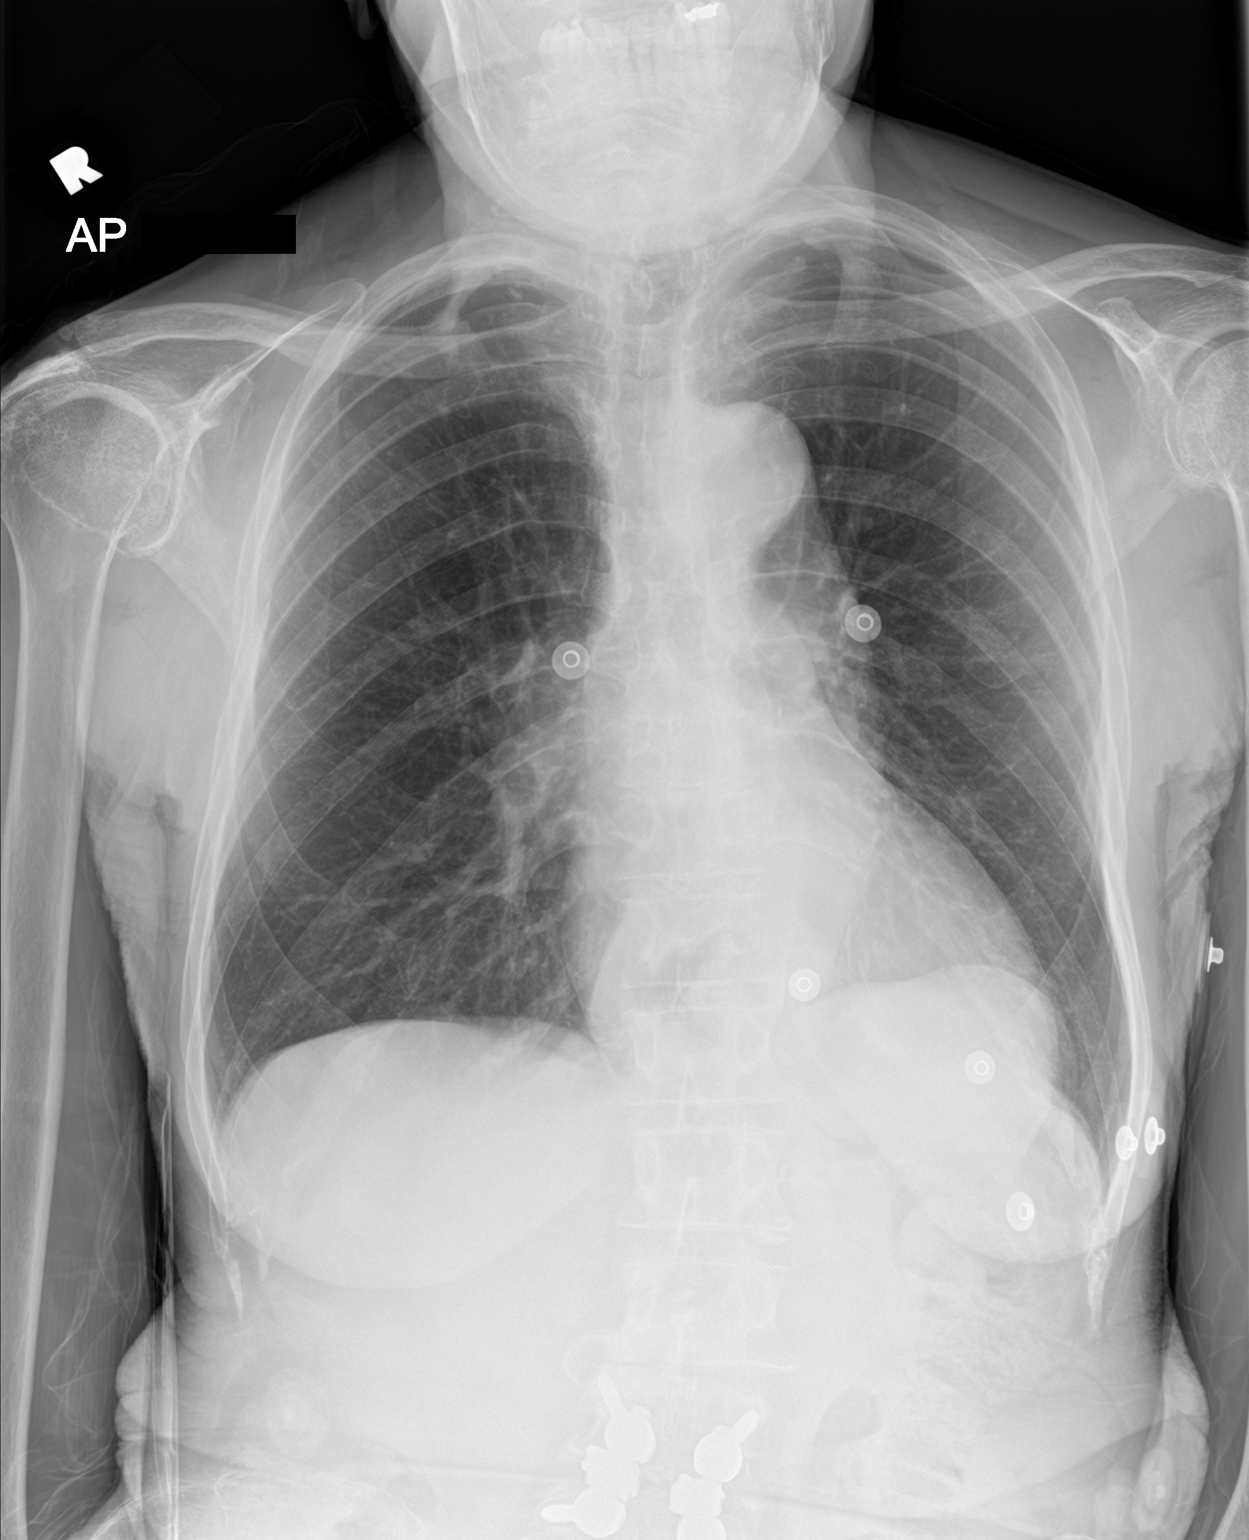

[2 of 2 positions shown; findings below may reference images not displayed]

FINDINGS: The heart size and pulmonary vascularity are normal. Moderate hiatal
hernia. Lungs are clear. No acute bone abnormality. No effusions.
Aortic atherosclerosis.
IMPRESSION: No acute disease. Moderate hiatal hernia.

## 2021-02-12 IMAGING — CR DG RIBS W/ CHEST 3+V BILAT
6 series · 6 of 6 positions shown · non-contrast
Comparison: January 02, 2020

CLINICAL DATA: Status post fall and subsequent left rib pain.

EXAM:
BILATERAL RIBS AND CHEST - 4+ VIEW

[rib pa obl (1 of 3)]
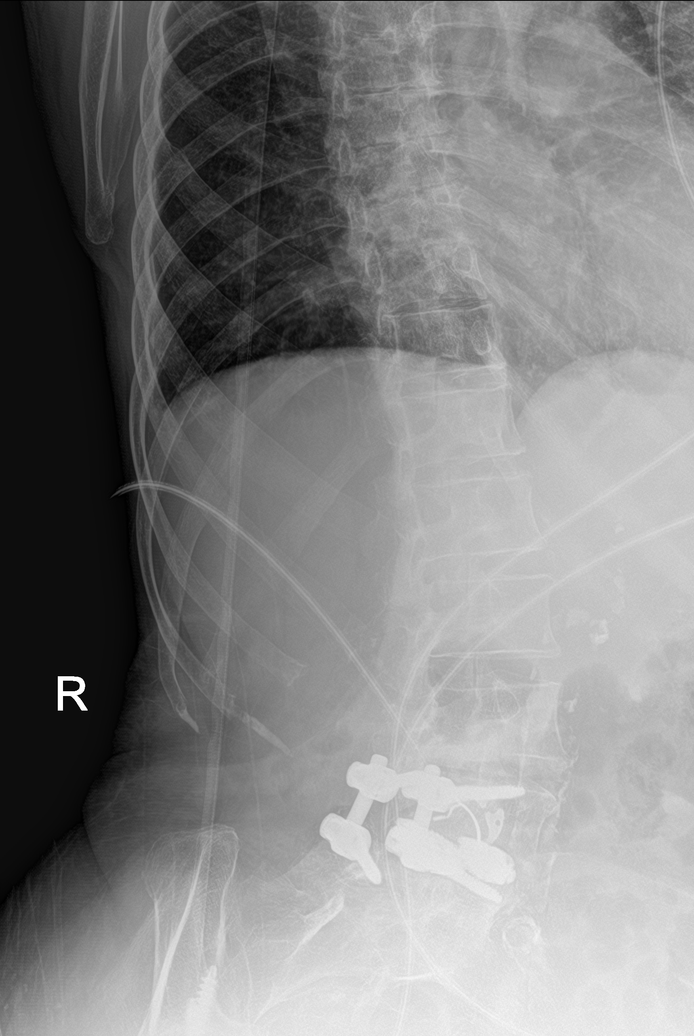

[rib pa obl (2 of 3)]
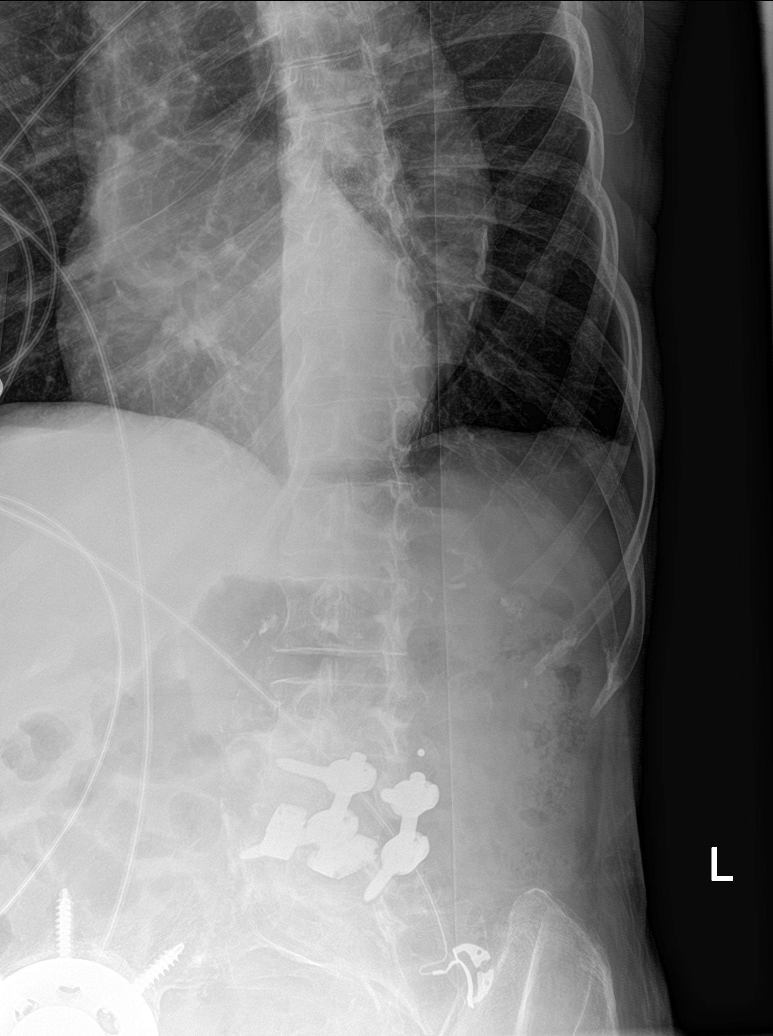

[rib ap (1 of 2)]
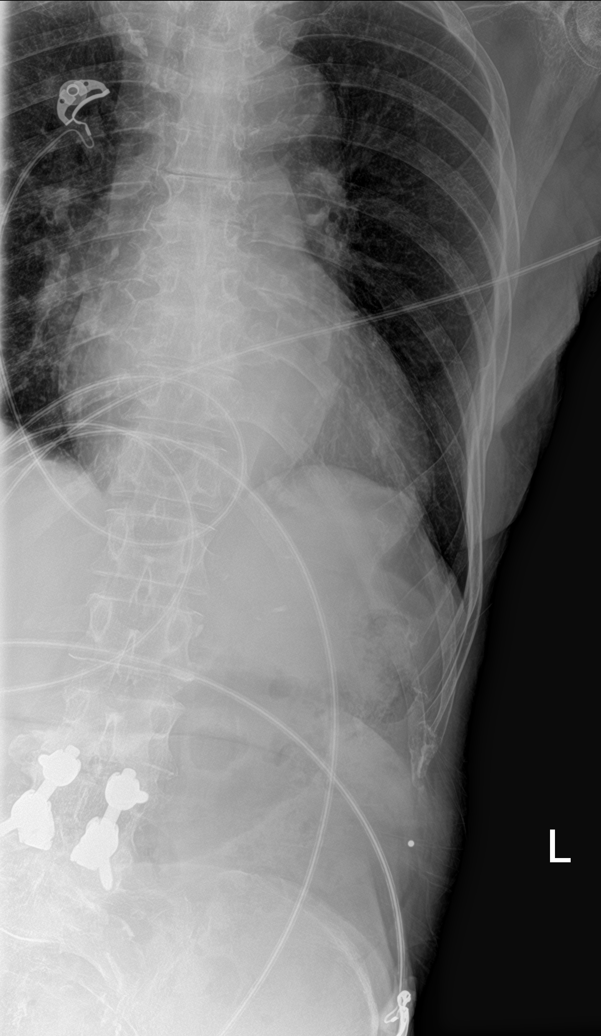

[rib ap (2 of 2)]
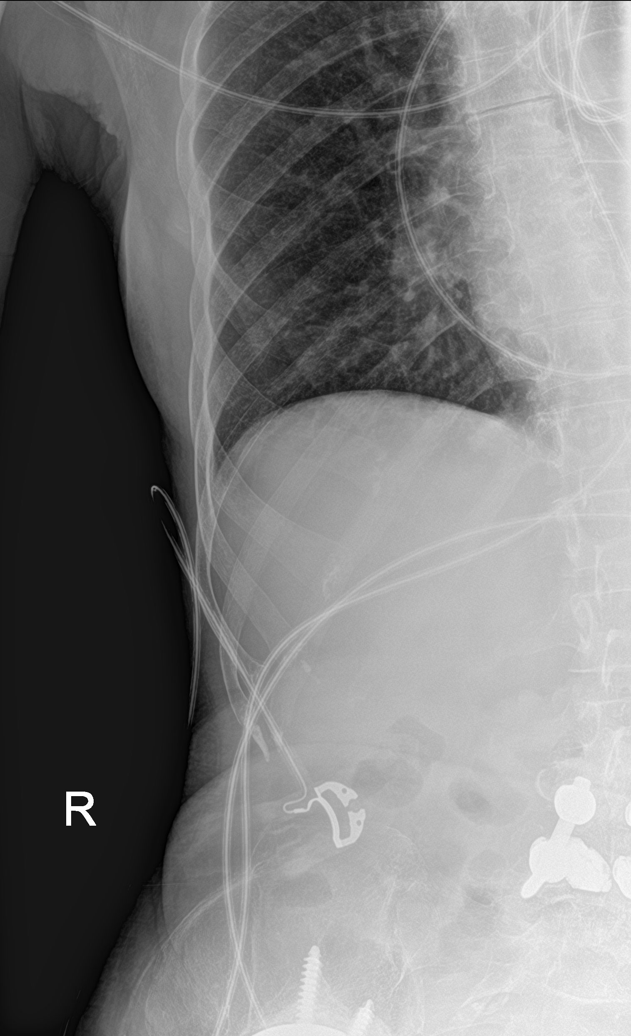

[chest ap]
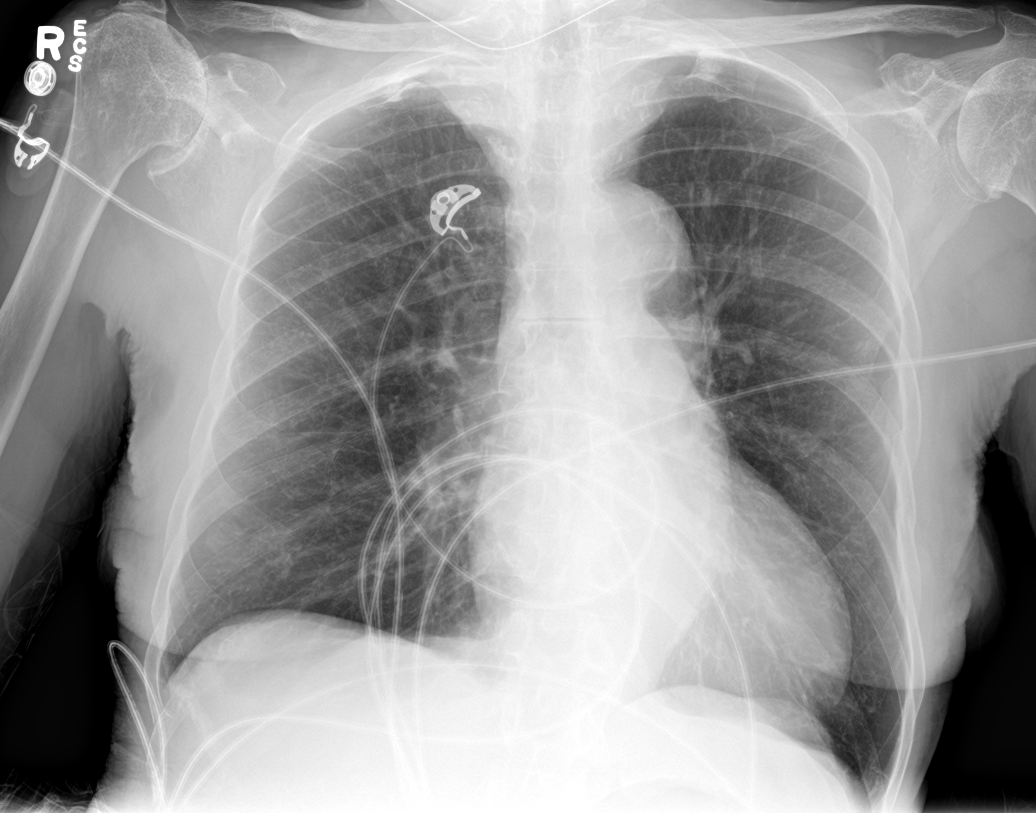

[rib pa obl (3 of 3)]
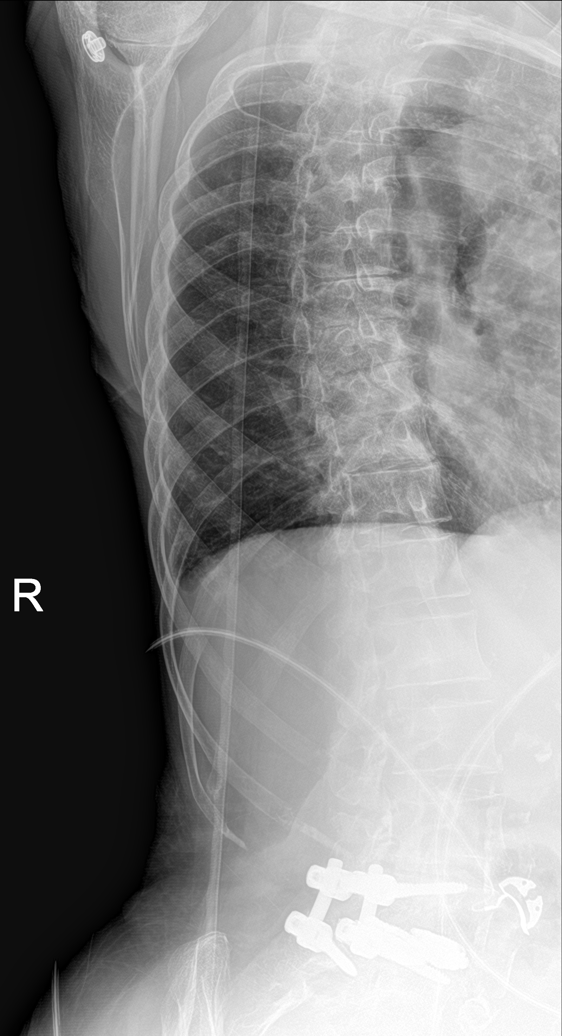

[6 of 6 positions shown; findings below may reference images not displayed]

FINDINGS: No acute fracture or other bone lesions are seen involving the ribs.
A chronic sixth left rib fracture is noted. A chronic deformity of
the first left rib is also seen. Bilateral radiopaque pedicle screws
are seen within the lower lumbar spine. There is no evidence of
pneumothorax or pleural effusion. Both lungs are clear. Heart size
and mediastinal contours are within normal limits.
IMPRESSION: Chronic first and sixth left rib deformities, without evidence of
acute osseous abnormality.

## 2021-03-16 IMAGING — DX DG CHEST 1V PORT
1 series · 1 of 1 positions shown · non-contrast
Comparison: January 05, 2020.

CLINICAL DATA: Shortness of breath.

EXAM:
PORTABLE CHEST 1 VIEW

[chest ap]
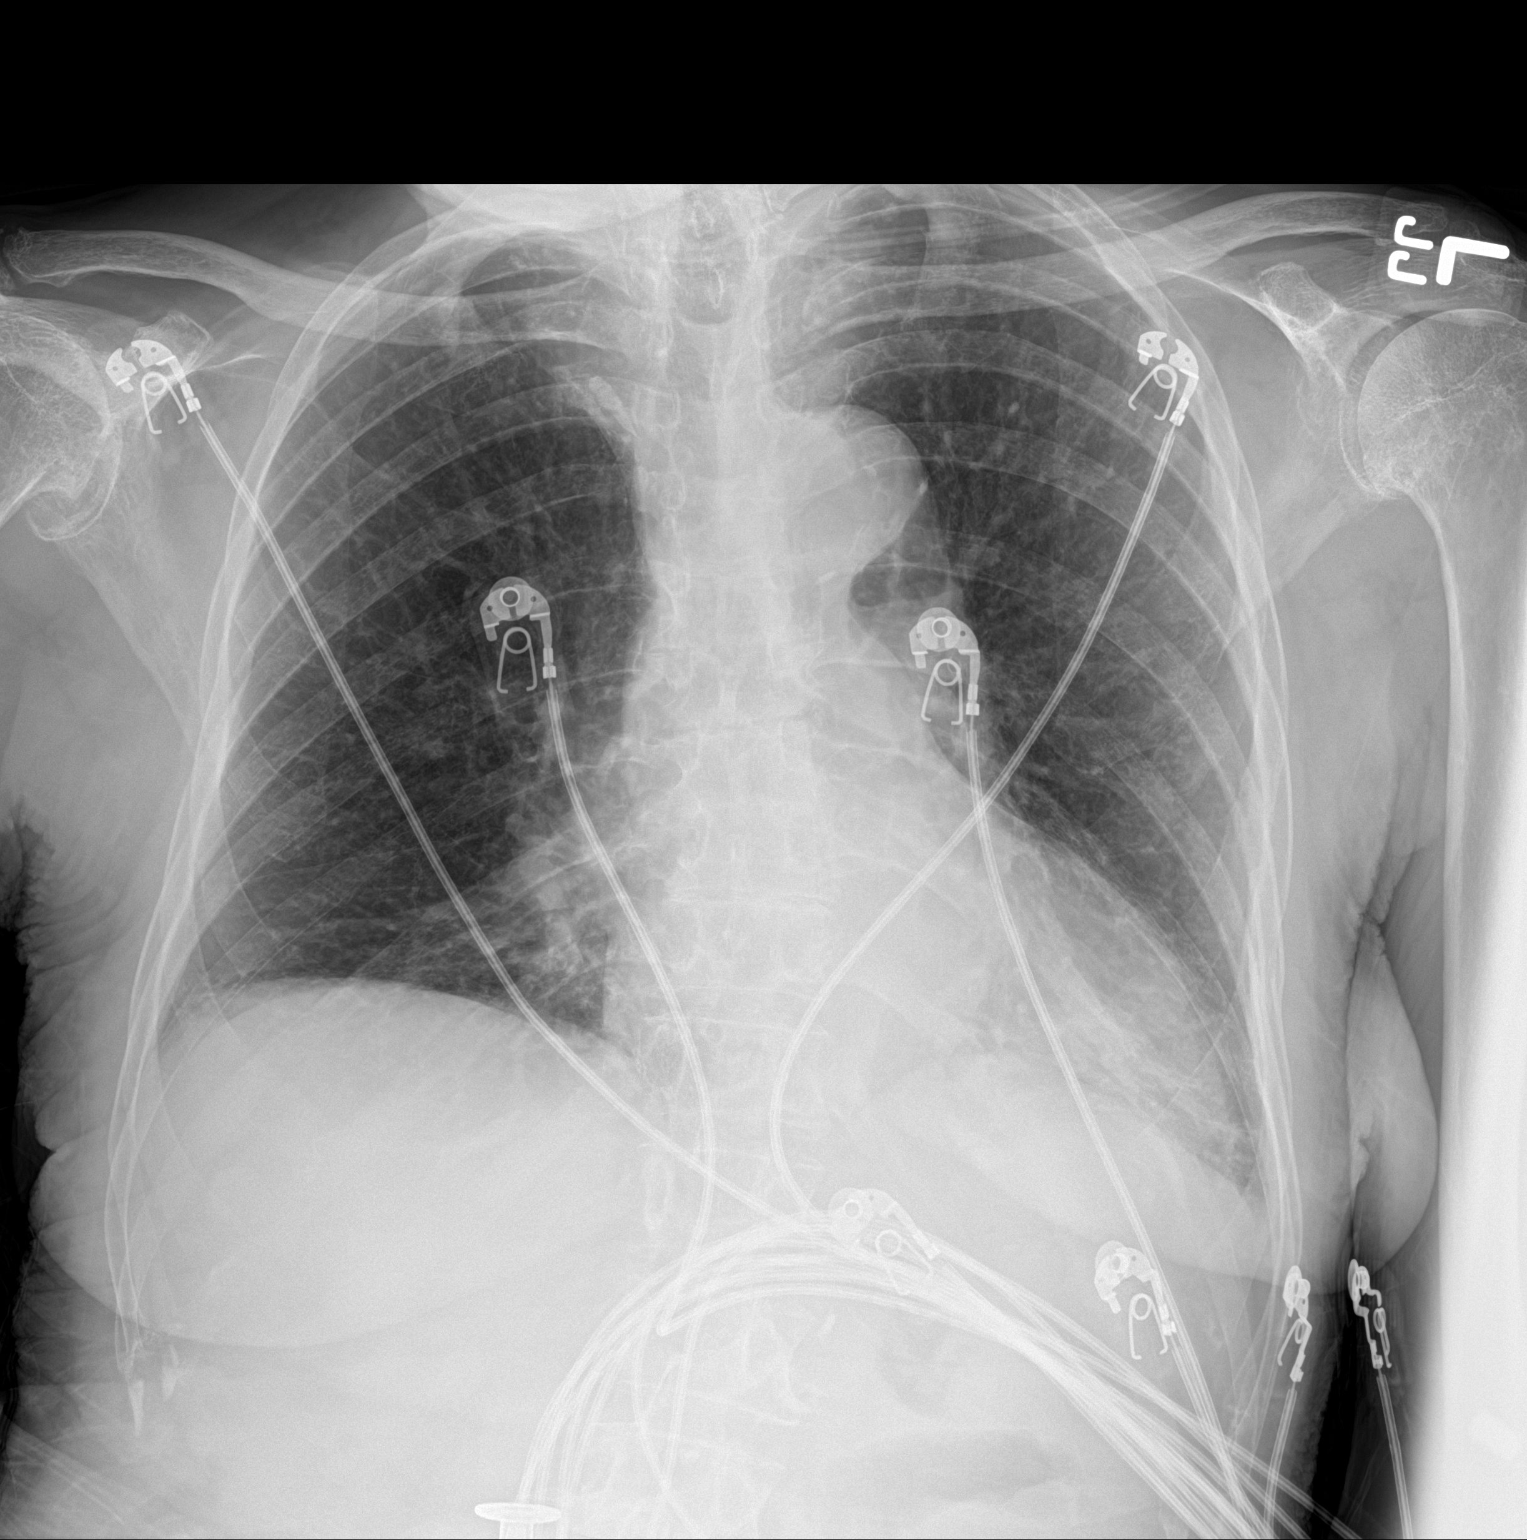

[1 of 1 positions shown; findings below may reference images not displayed]

FINDINGS: The heart size and mediastinal contours are within normal limits. No
pneumothorax or pleural effusion is noted. Right lung is clear.
Minimal left basilar subsegmental atelectasis is noted. The
visualized skeletal structures are unremarkable.
IMPRESSION: Minimal left basilar subsegmental atelectasis.

## 2021-04-22 IMAGING — CR DG CHEST 2V
2 series · 2 of 2 positions shown · non-contrast
Comparison: 02/06/2020

CLINICAL DATA: Wheezing and shortness of breath for 1 month.
Hypertension and chronic kidney disease.

EXAM:
CHEST - 2 VIEW

[w chest pa]
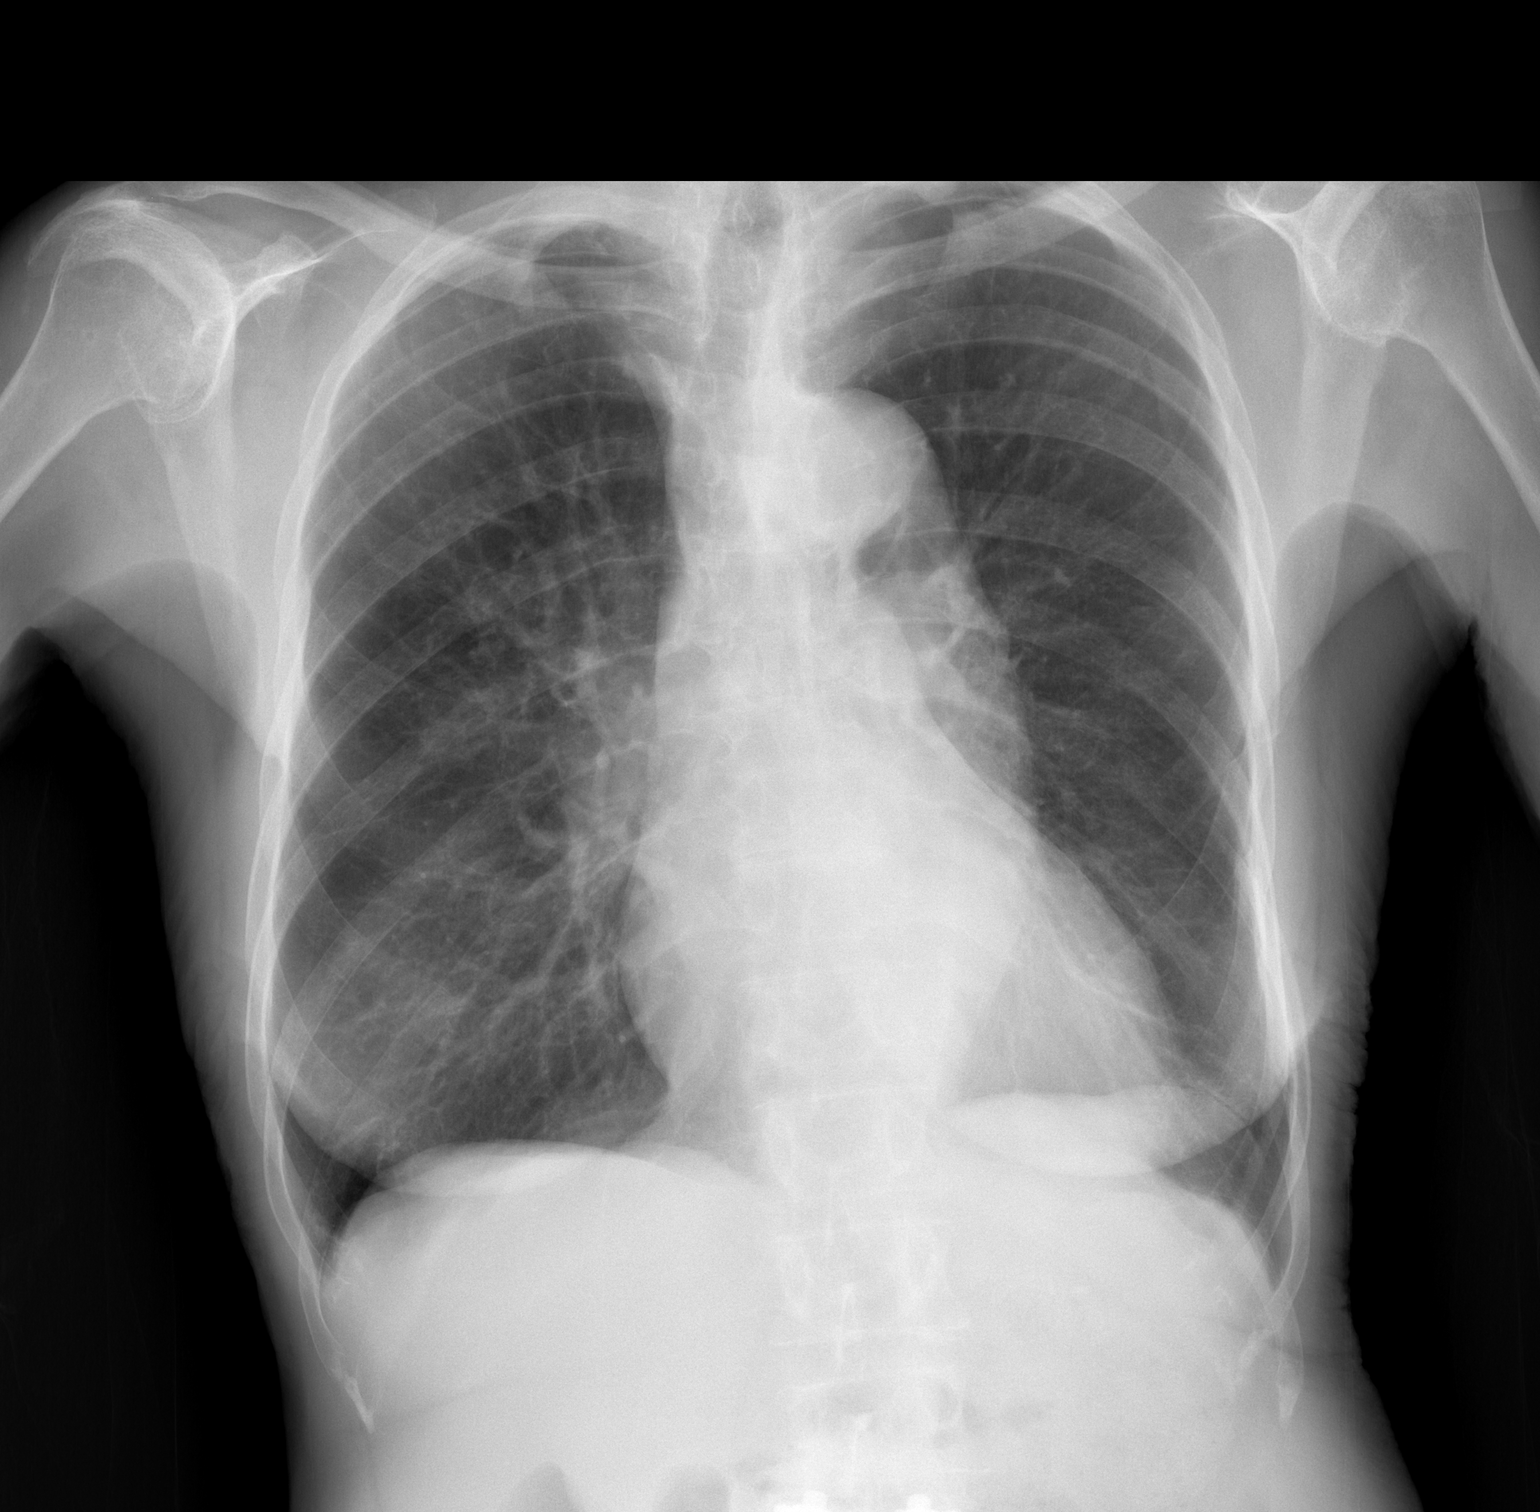

[w chest lat]
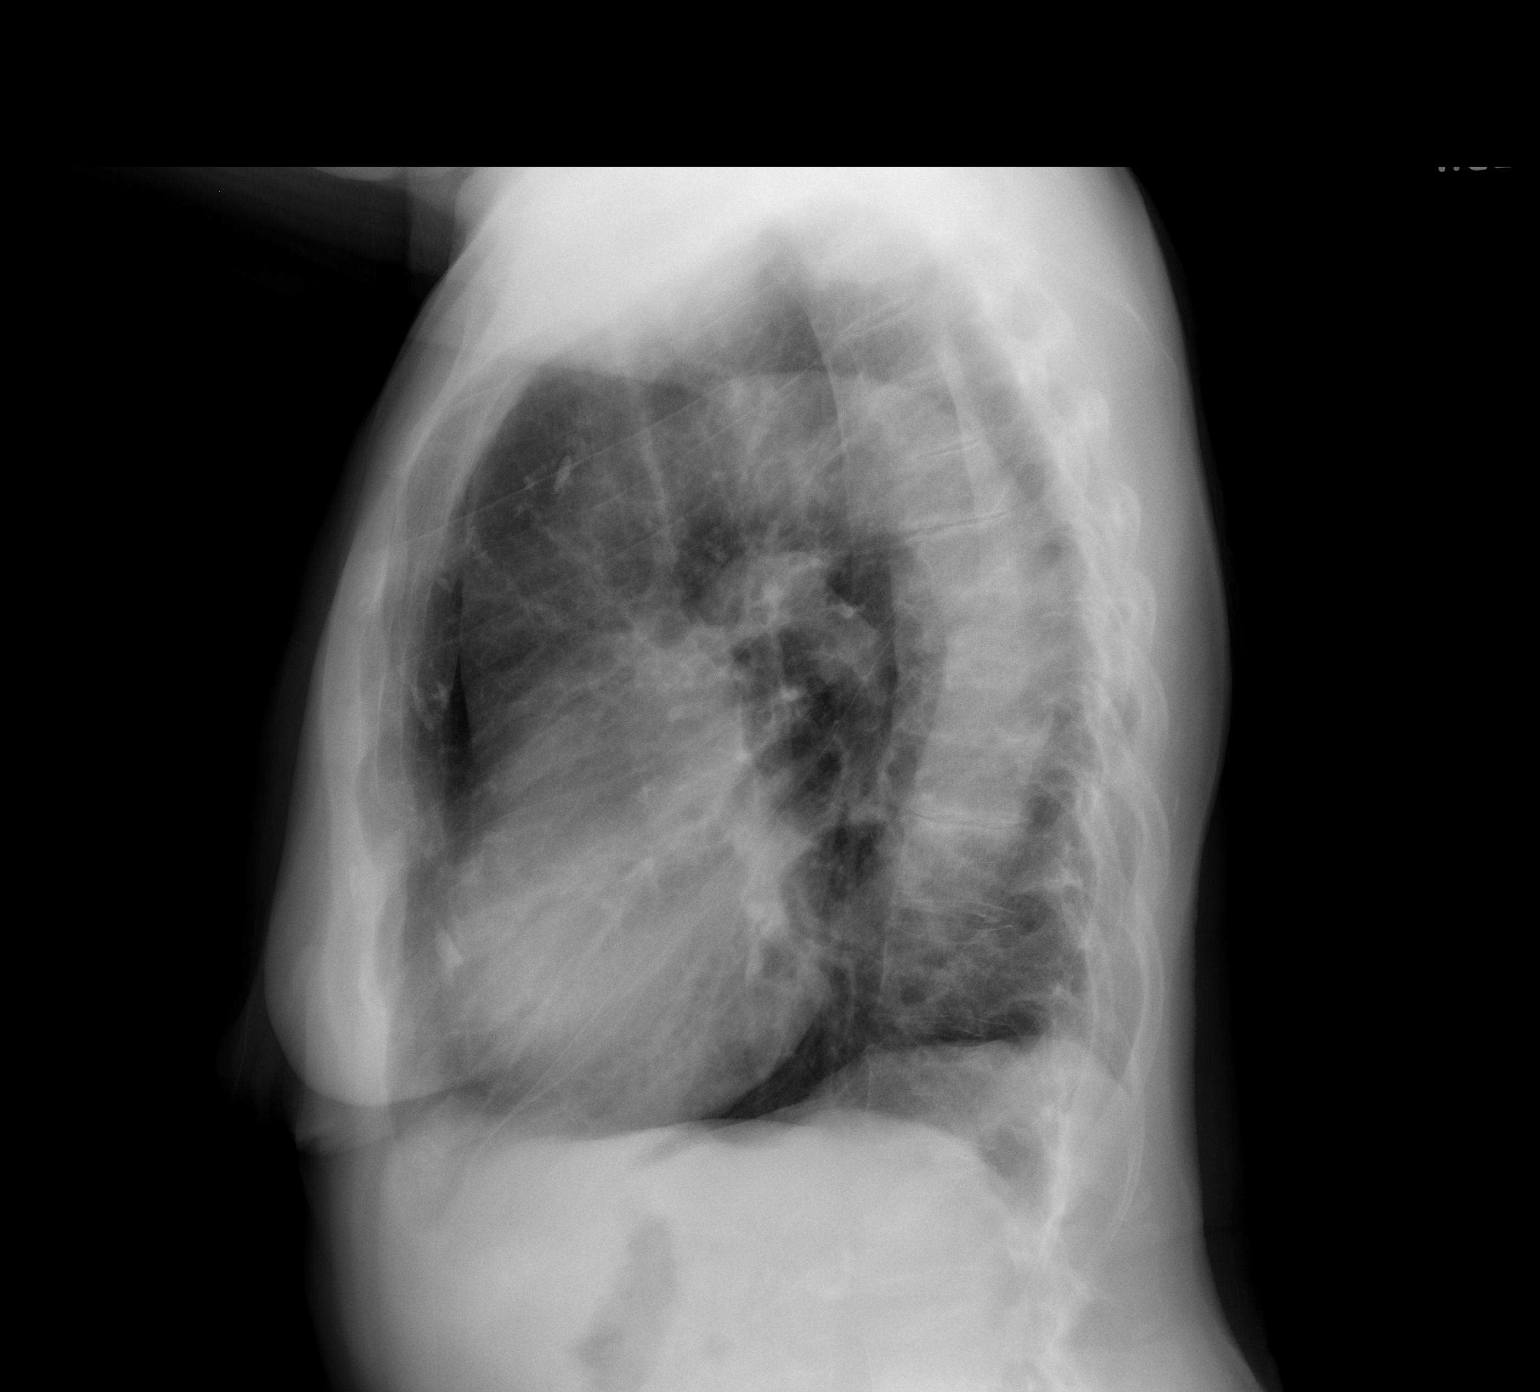

[2 of 2 positions shown; findings below may reference images not displayed]

FINDINGS: Stable mild-to-moderate cardiomegaly. Stable ectasia of thoracic
aorta. Pulmonary hyperinflation is seen, consistent with COPD. No
evidence of pulmonary infiltrate, edema, or pleural effusion.
IMPRESSION: Stable cardiomegaly.  COPD. No active lung disease.

## 2021-06-10 IMAGING — CT CT ABDOMEN W/O CM
1 of 2 series · 13 of 32 positions shown, 19 images · non-contrast
Comparison: CT abdomen pelvis dated January 02, 2020.

CLINICAL DATA: Abdominal pain for the past 6 months.

EXAM:
CT ABDOMEN WITHOUT CONTRAST
TECHNIQUE: Multidetector CT imaging of the abdomen was performed following the
standard protocol without IV contrast.

[Series 2: abd w/(date) · axial · 0.70mm/px · z∈[-226,-46]mm · 13 of 42 slices shown, 19 images]
[im 3/42  soft-tissue]
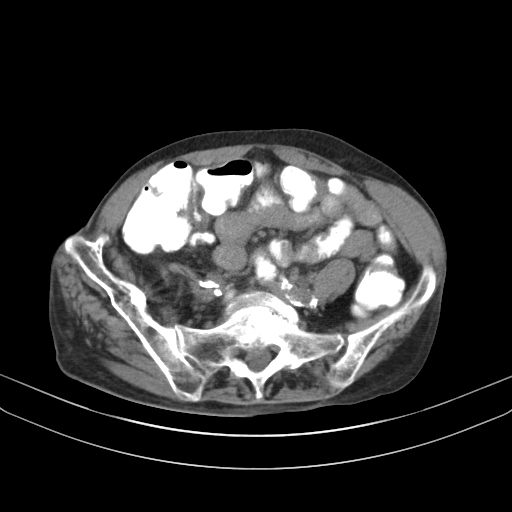
[im 3/42  bone]
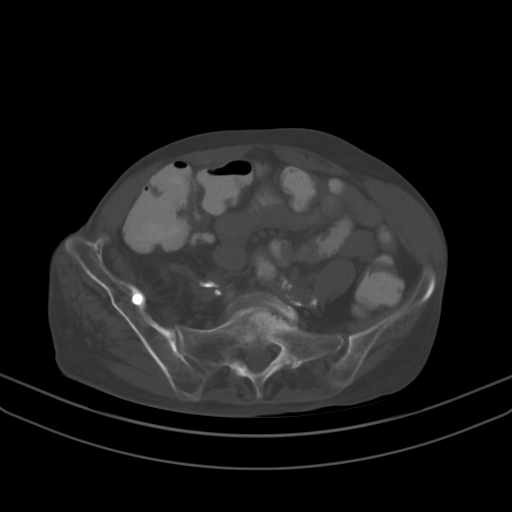
[im 6/42  soft-tissue]
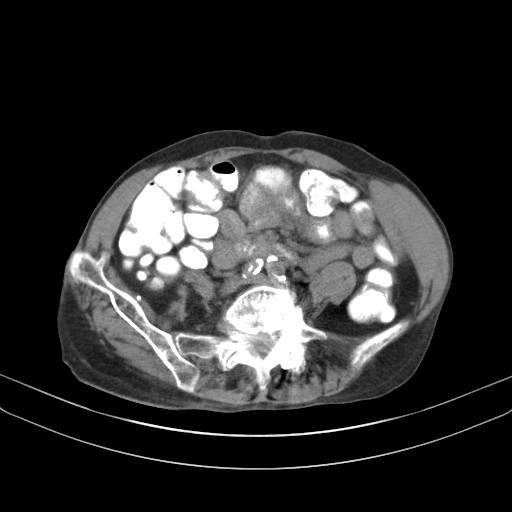
[im 9/42  soft-tissue]
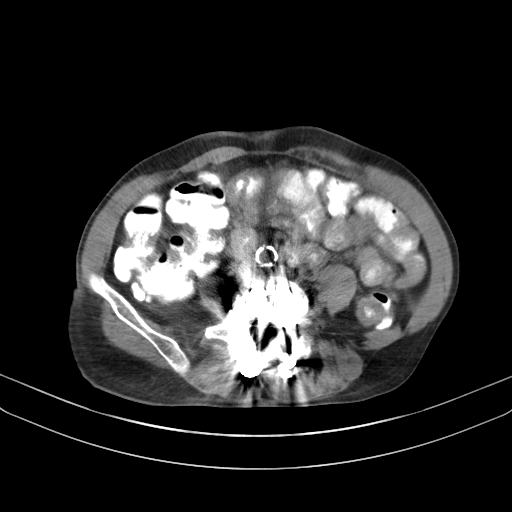
[im 12/42  soft-tissue]
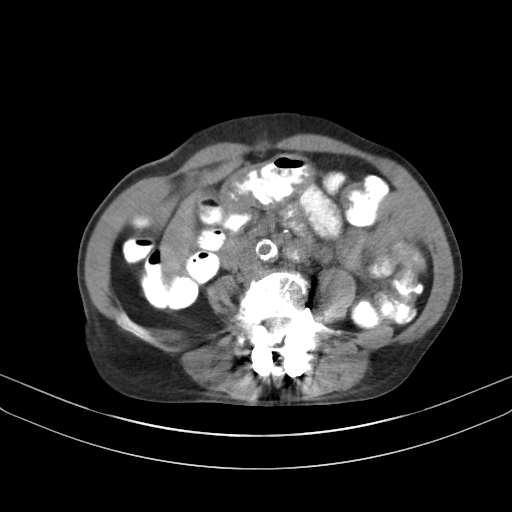
[im 15/42  soft-tissue]
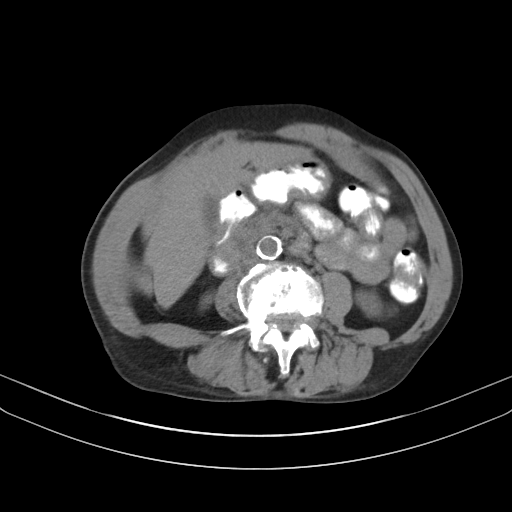
[im 18/42  soft-tissue]
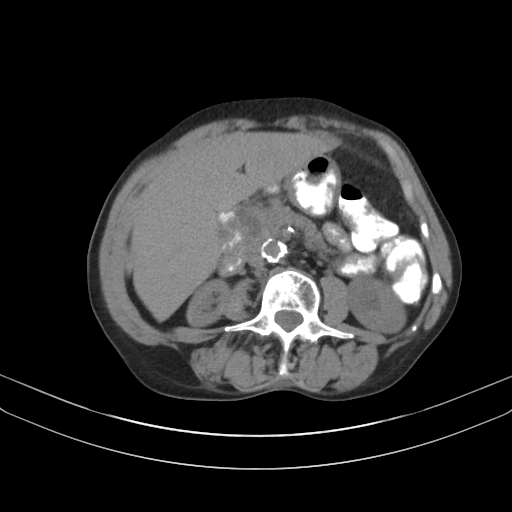
[im 21/42  soft-tissue]
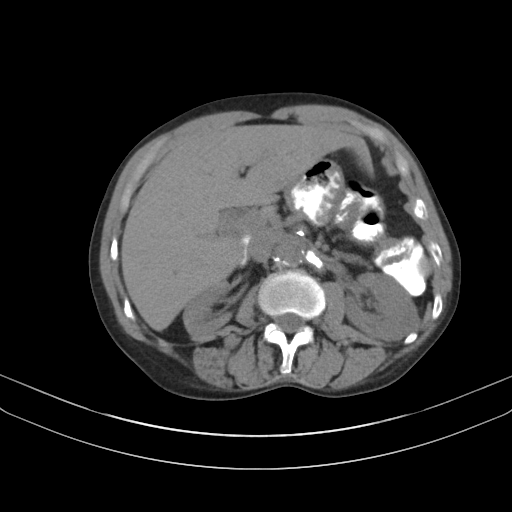
[im 24/42  soft-tissue]
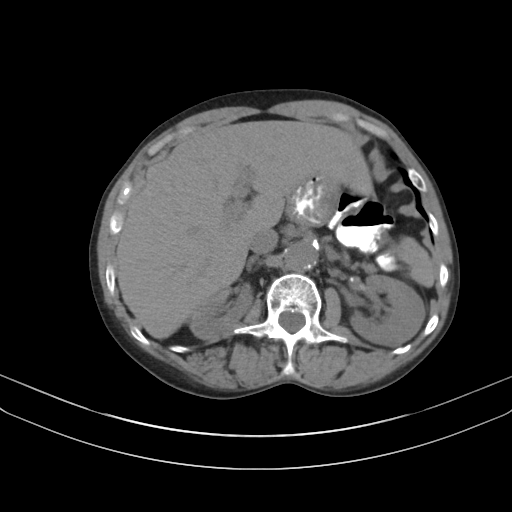
[im 27/42  soft-tissue]
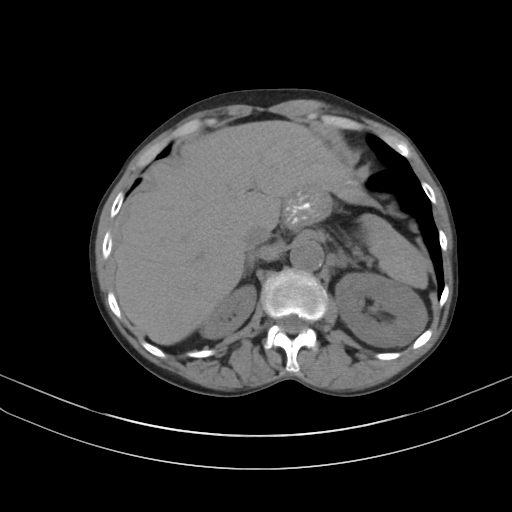
[im 27/42  bone]
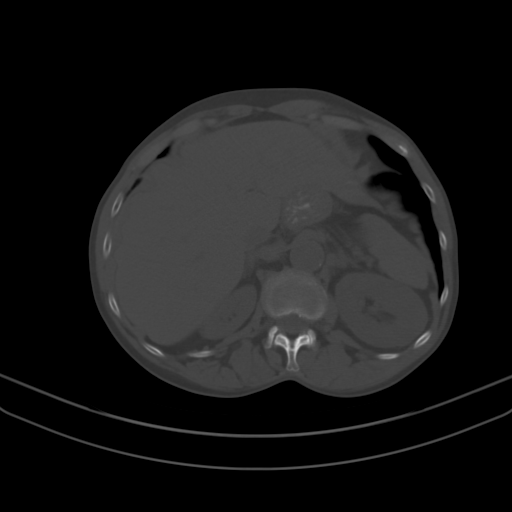
[im 30/42  soft-tissue]
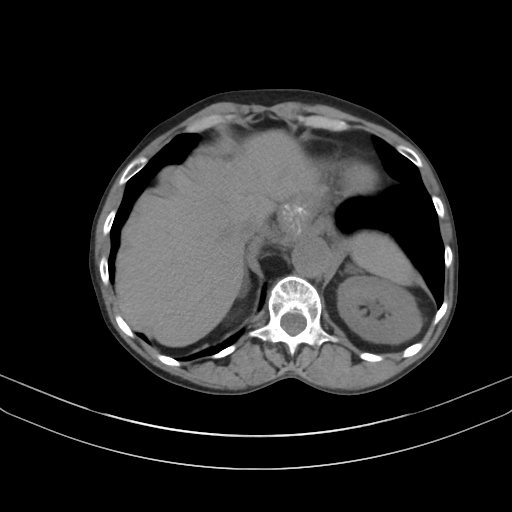
[im 30/42  lung]
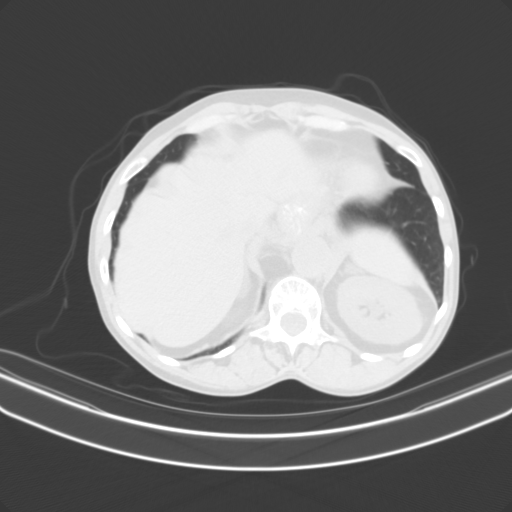
[im 33/42  soft-tissue]
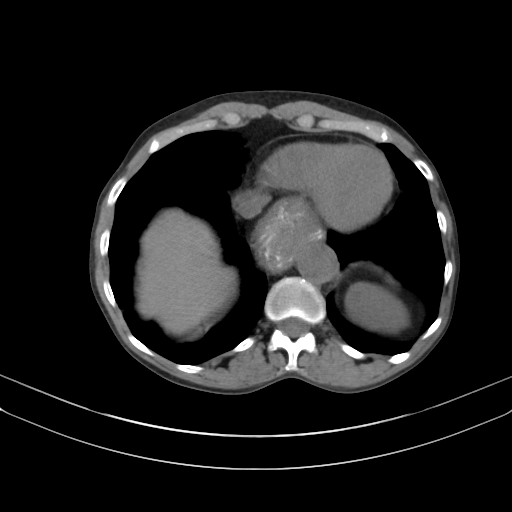
[im 33/42  lung]
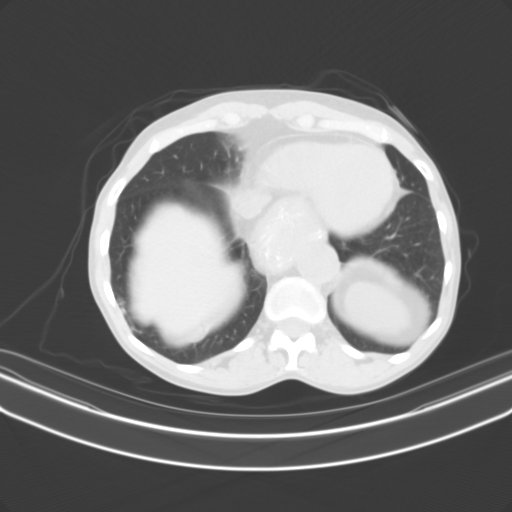
[im 36/42  soft-tissue]
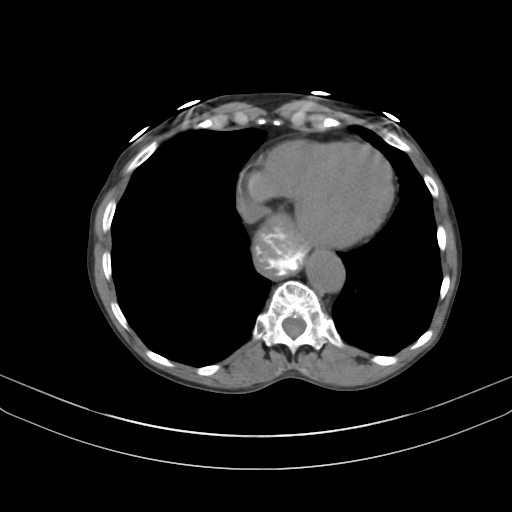
[im 36/42  lung]
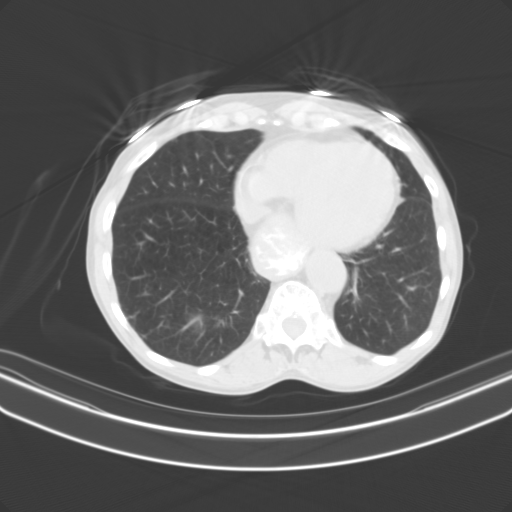
[im 39/42  soft-tissue]
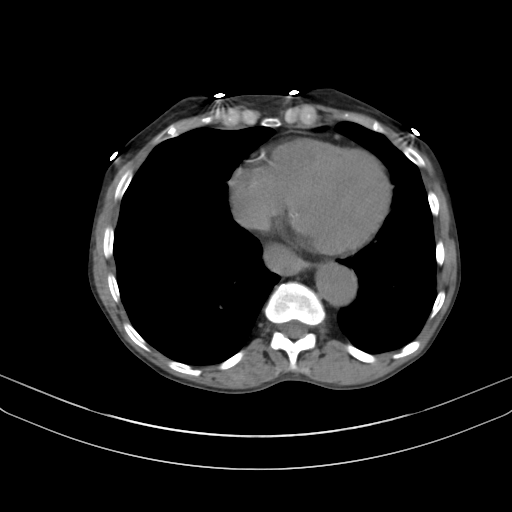
[im 39/42  lung]
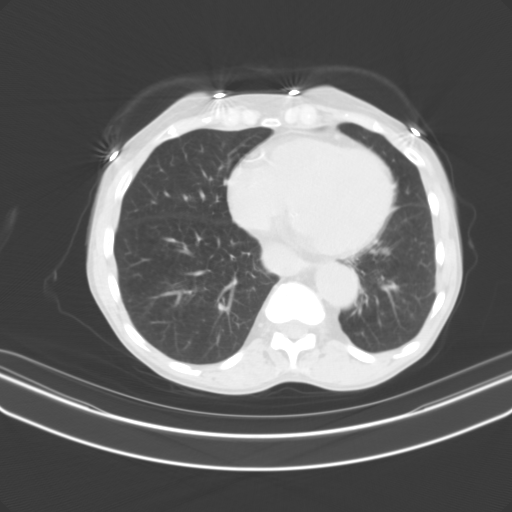

[13 of 32 positions shown; findings below may reference images not displayed]

FINDINGS: Lower chest: No acute abnormality. Centrilobular emphysema. Small
pulmonary nodules at the lung bases are unchanged.

Hepatobiliary: No focal liver abnormality is seen. No gallstones,
gallbladder wall thickening, or biliary dilatation.

Pancreas: Unremarkable. No pancreatic ductal dilatation or
surrounding inflammatory changes.

Spleen: Normal in size without focal abnormality.

Adrenals/Urinary Tract: The adrenal glands are unremarkable.
Unchanged right renal atrophy and small bilateral renal cysts. No
renal calculi or hydronephrosis.

Stomach/Bowel: Unchanged moderate hiatal hernia. The stomach is
otherwise within normal limits. No bowel wall thickening,
distention, or surrounding inflammatory changes.

Vascular/Lymphatic: Aortic atherosclerosis. No enlarged abdominal
lymph nodes.

Other: Unchanged small fat containing supraumbilical and umbilical
hernias.

Musculoskeletal: No acute or significant osseous findings.
IMPRESSION: 1. No acute intra-abdominal process.
2. Unchanged moderate hiatal hernia.
3. Aortic Atherosclerosis ([id number]-RQN.N) and Emphysema ([id number]-RAN.1).

## 2021-06-15 ENCOUNTER — Other Ambulatory Visit: Payer: Self-pay

## 2021-06-15 ENCOUNTER — Inpatient Hospital Stay (HOSPITAL_COMMUNITY)
Admission: EM | Admit: 2021-06-15 | Discharge: 2021-07-10 | DRG: 205 | Disposition: A | Discharge status: Skilled Nursing Facility | Payer: Medicare HMO | Attending: Internal Medicine | Admitting: Internal Medicine

## 2021-06-15 ENCOUNTER — Emergency Department (HOSPITAL_COMMUNITY): Payer: Medicare HMO

## 2021-06-15 DIAGNOSIS — K625 Hemorrhage of anus and rectum: Secondary | ICD-10-CM

## 2021-06-15 DIAGNOSIS — G894 Chronic pain syndrome: Secondary | ICD-10-CM | POA: Diagnosis present

## 2021-06-15 DIAGNOSIS — J441 Chronic obstructive pulmonary disease with (acute) exacerbation: Secondary | ICD-10-CM | POA: Diagnosis present

## 2021-06-15 DIAGNOSIS — B0229 Other postherpetic nervous system involvement: Secondary | ICD-10-CM | POA: Diagnosis present

## 2021-06-15 DIAGNOSIS — Z8249 Family history of ischemic heart disease and other diseases of the circulatory system: Secondary | ICD-10-CM

## 2021-06-15 DIAGNOSIS — E440 Moderate protein-calorie malnutrition: Secondary | ICD-10-CM | POA: Insufficient documentation

## 2021-06-15 DIAGNOSIS — Z83438 Family history of other disorder of lipoprotein metabolism and other lipidemia: Secondary | ICD-10-CM

## 2021-06-15 DIAGNOSIS — F17210 Nicotine dependence, cigarettes, uncomplicated: Secondary | ICD-10-CM | POA: Diagnosis present

## 2021-06-15 DIAGNOSIS — J9819 Other pulmonary collapse: Principal | ICD-10-CM | POA: Diagnosis present

## 2021-06-15 DIAGNOSIS — Z515 Encounter for palliative care: Secondary | ICD-10-CM

## 2021-06-15 DIAGNOSIS — J449 Chronic obstructive pulmonary disease, unspecified: Secondary | ICD-10-CM | POA: Diagnosis present

## 2021-06-15 DIAGNOSIS — K5900 Constipation, unspecified: Secondary | ICD-10-CM

## 2021-06-15 DIAGNOSIS — E785 Hyperlipidemia, unspecified: Secondary | ICD-10-CM | POA: Diagnosis present

## 2021-06-15 DIAGNOSIS — J9811 Atelectasis: Secondary | ICD-10-CM

## 2021-06-15 DIAGNOSIS — R634 Abnormal weight loss: Secondary | ICD-10-CM | POA: Diagnosis present

## 2021-06-15 DIAGNOSIS — D649 Anemia, unspecified: Secondary | ICD-10-CM | POA: Diagnosis present

## 2021-06-15 DIAGNOSIS — Z66 Do not resuscitate: Secondary | ICD-10-CM

## 2021-06-15 DIAGNOSIS — I129 Hypertensive chronic kidney disease with stage 1 through stage 4 chronic kidney disease, or unspecified chronic kidney disease: Secondary | ICD-10-CM | POA: Diagnosis present

## 2021-06-15 DIAGNOSIS — J432 Centrilobular emphysema: Secondary | ICD-10-CM | POA: Diagnosis present

## 2021-06-15 DIAGNOSIS — I739 Peripheral vascular disease, unspecified: Secondary | ICD-10-CM | POA: Diagnosis present

## 2021-06-15 DIAGNOSIS — R0602 Shortness of breath: Secondary | ICD-10-CM | POA: Diagnosis present

## 2021-06-15 DIAGNOSIS — F418 Other specified anxiety disorders: Secondary | ICD-10-CM | POA: Diagnosis present

## 2021-06-15 DIAGNOSIS — B029 Zoster without complications: Secondary | ICD-10-CM | POA: Diagnosis present

## 2021-06-15 DIAGNOSIS — F0390 Unspecified dementia without behavioral disturbance: Secondary | ICD-10-CM | POA: Diagnosis present

## 2021-06-15 DIAGNOSIS — F32A Depression, unspecified: Secondary | ICD-10-CM | POA: Diagnosis present

## 2021-06-15 DIAGNOSIS — J9621 Acute and chronic respiratory failure with hypoxia: Secondary | ICD-10-CM | POA: Diagnosis present

## 2021-06-15 DIAGNOSIS — Z96641 Presence of right artificial hip joint: Secondary | ICD-10-CM | POA: Diagnosis present

## 2021-06-15 DIAGNOSIS — I160 Hypertensive urgency: Secondary | ICD-10-CM | POA: Diagnosis present

## 2021-06-15 DIAGNOSIS — I259 Chronic ischemic heart disease, unspecified: Secondary | ICD-10-CM

## 2021-06-15 DIAGNOSIS — B028 Zoster with other complications: Secondary | ICD-10-CM | POA: Diagnosis present

## 2021-06-15 DIAGNOSIS — Z20822 Contact with and (suspected) exposure to covid-19: Secondary | ICD-10-CM | POA: Diagnosis present

## 2021-06-15 DIAGNOSIS — T380X5A Adverse effect of glucocorticoids and synthetic analogues, initial encounter: Secondary | ICD-10-CM | POA: Diagnosis not present

## 2021-06-15 DIAGNOSIS — N179 Acute kidney failure, unspecified: Secondary | ICD-10-CM | POA: Diagnosis present

## 2021-06-15 DIAGNOSIS — F419 Anxiety disorder, unspecified: Secondary | ICD-10-CM | POA: Diagnosis present

## 2021-06-15 DIAGNOSIS — I10 Essential (primary) hypertension: Secondary | ICD-10-CM | POA: Diagnosis present

## 2021-06-15 DIAGNOSIS — E875 Hyperkalemia: Secondary | ICD-10-CM | POA: Diagnosis present

## 2021-06-15 DIAGNOSIS — Z79899 Other long term (current) drug therapy: Secondary | ICD-10-CM

## 2021-06-15 DIAGNOSIS — Z9981 Dependence on supplemental oxygen: Secondary | ICD-10-CM

## 2021-06-15 DIAGNOSIS — R778 Other specified abnormalities of plasma proteins: Secondary | ICD-10-CM

## 2021-06-15 DIAGNOSIS — N184 Chronic kidney disease, stage 4 (severe): Secondary | ICD-10-CM | POA: Diagnosis present

## 2021-06-15 DIAGNOSIS — K6289 Other specified diseases of anus and rectum: Secondary | ICD-10-CM

## 2021-06-15 DIAGNOSIS — K921 Melena: Secondary | ICD-10-CM | POA: Diagnosis not present

## 2021-06-15 DIAGNOSIS — Z7982 Long term (current) use of aspirin: Secondary | ICD-10-CM

## 2021-06-15 DIAGNOSIS — K649 Unspecified hemorrhoids: Secondary | ICD-10-CM | POA: Diagnosis present

## 2021-06-15 DIAGNOSIS — D631 Anemia in chronic kidney disease: Secondary | ICD-10-CM | POA: Diagnosis present

## 2021-06-15 DIAGNOSIS — Z681 Body mass index (BMI) 19 or less, adult: Secondary | ICD-10-CM

## 2021-06-15 DIAGNOSIS — E872 Acidosis: Secondary | ICD-10-CM | POA: Diagnosis present

## 2021-06-15 DIAGNOSIS — K5641 Fecal impaction: Secondary | ICD-10-CM | POA: Diagnosis present

## 2021-06-15 DIAGNOSIS — Z7902 Long term (current) use of antithrombotics/antiplatelets: Secondary | ICD-10-CM

## 2021-06-15 DIAGNOSIS — R296 Repeated falls: Secondary | ICD-10-CM | POA: Diagnosis present

## 2021-06-15 DIAGNOSIS — Z8616 Personal history of COVID-19: Secondary | ICD-10-CM

## 2021-06-15 LAB — COMPREHENSIVE METABOLIC PANEL
ALT: 46 U/L — ABNORMAL HIGH (ref 0–44)
AST: 36 U/L (ref 15–41)
Albumin: 3.9 g/dL (ref 3.5–5.0)
Alkaline Phosphatase: 96 U/L (ref 38–126)
Anion gap: 12 (ref 5–15)
BUN: 32 mg/dL — ABNORMAL HIGH (ref 8–23)
CO2: 16 mmol/L — ABNORMAL LOW (ref 22–32)
Calcium: 9.6 mg/dL (ref 8.9–10.3)
Chloride: 108 mmol/L (ref 98–111)
Creatinine, Ser: 3.17 mg/dL — ABNORMAL HIGH (ref 0.44–1.00)
GFR, Estimated: 15 mL/min — ABNORMAL LOW (ref >60)
Glucose, Bld: 98 mg/dL (ref 70–99)
Potassium: 4.8 mmol/L (ref 3.5–5.1)
Sodium: 136 mmol/L (ref 135–145)
Total Bilirubin: 0.7 mg/dL (ref 0.3–1.2)
Total Protein: 7.1 g/dL (ref 6.5–8.1)

## 2021-06-15 LAB — CBC WITH DIFFERENTIAL/PLATELET
Abs Immature Granulocytes: 0.05 10*3/uL (ref 0.00–0.07)
Basophils Absolute: 0.1 10*3/uL (ref 0.0–0.1)
Basophils Relative: 2 %
Eosinophils Absolute: 0.3 10*3/uL (ref 0.0–0.5)
Eosinophils Relative: 4 %
HCT: 31.7 % — ABNORMAL LOW (ref 36.0–46.0)
Hemoglobin: 10.2 g/dL — ABNORMAL LOW (ref 12.0–15.0)
Immature Granulocytes: 1 %
Lymphocytes Relative: 19 %
Lymphs Abs: 1.3 10*3/uL (ref 0.7–4.0)
MCH: 27.8 pg (ref 26.0–34.0)
MCHC: 32.2 g/dL (ref 30.0–36.0)
MCV: 86.4 fL (ref 80.0–100.0)
Monocytes Absolute: 1.2 10*3/uL — ABNORMAL HIGH (ref 0.1–1.0)
Monocytes Relative: 17 %
Neutro Abs: 4.1 10*3/uL (ref 1.7–7.7)
Neutrophils Relative %: 57 %
Platelets: 331 10*3/uL (ref 150–400)
RBC: 3.67 MIL/uL — ABNORMAL LOW (ref 3.87–5.11)
RDW: 16.4 % — ABNORMAL HIGH (ref 11.5–15.5)
WBC: 7 10*3/uL (ref 4.0–10.5)
nRBC: 0 % (ref 0.0–0.2)

## 2021-06-15 LAB — LIPASE, BLOOD: Lipase: 21 U/L (ref 11–51)

## 2021-06-15 LAB — TROPONIN I (HIGH SENSITIVITY): Troponin I (High Sensitivity): 31 ng/L — ABNORMAL HIGH (ref <18)

## 2021-06-15 NOTE — ED Triage Notes (Signed)
Pt arrives with multiple complaints: BLE, L hip, back, abdominal, and chest pain x 3-4 days. 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 Taking OTC meds without relief.

## 2021-06-15 NOTE — ED Provider Notes (Signed)
Emergency Medicine Provider Triage Evaluation Note  Alyssa [name] , a 80 y.o. female  was evaluated in triage.  Pt complains of chest pain, difficulty breathing, abd pain. Subjective fevers.  Review of Systems  Positive: Cp, sob, abd pain, fevers, chills Negative:   Physical Exam  BP (!) 167/90   Pulse 71   Temp 98.5 F (36.9 C) (Oral)   Resp 16   SpO2 99%  Gen:   Awake, no distress   Resp:  Normal effort, wheezes noted GI:  Tender to palpation   Medical Decision Making  Medically screening exam initiated at 5:28 PM.  Appropriate orders placed.  Alyssa [name] was informed that the remainder of the evaluation will be completed by another provider, this initial triage assessment does not replace that evaluation, and the importance of remaining in the ED until their evaluation is complete.  Lots of alternating complaints   Rhae Hammock, PA-C 06/15/21 1749    Teressa Lower, MD 06/16/21 [phone]

## 2021-06-16 ENCOUNTER — Encounter (HOSPITAL_COMMUNITY): Payer: Self-pay | Admitting: Emergency Medicine

## 2021-06-16 ENCOUNTER — Emergency Department (HOSPITAL_COMMUNITY): Payer: Medicare HMO

## 2021-06-16 ENCOUNTER — Telehealth: Payer: Self-pay | Admitting: Acute Care

## 2021-06-16 DIAGNOSIS — B028 Zoster with other complications: Secondary | ICD-10-CM

## 2021-06-16 DIAGNOSIS — R778 Other specified abnormalities of plasma proteins: Secondary | ICD-10-CM | POA: Diagnosis not present

## 2021-06-16 DIAGNOSIS — R634 Abnormal weight loss: Secondary | ICD-10-CM

## 2021-06-16 DIAGNOSIS — F418 Other specified anxiety disorders: Secondary | ICD-10-CM

## 2021-06-16 DIAGNOSIS — J9601 Acute respiratory failure with hypoxia: Secondary | ICD-10-CM

## 2021-06-16 DIAGNOSIS — J9819 Other pulmonary collapse: Secondary | ICD-10-CM | POA: Diagnosis not present

## 2021-06-16 DIAGNOSIS — D649 Anemia, unspecified: Secondary | ICD-10-CM | POA: Diagnosis not present

## 2021-06-16 DIAGNOSIS — J449 Chronic obstructive pulmonary disease, unspecified: Secondary | ICD-10-CM

## 2021-06-16 DIAGNOSIS — B029 Zoster without complications: Secondary | ICD-10-CM | POA: Diagnosis present

## 2021-06-16 DIAGNOSIS — R0602 Shortness of breath: Secondary | ICD-10-CM

## 2021-06-16 DIAGNOSIS — N184 Chronic kidney disease, stage 4 (severe): Secondary | ICD-10-CM

## 2021-06-16 DIAGNOSIS — I10 Essential (primary) hypertension: Secondary | ICD-10-CM

## 2021-06-16 LAB — URINALYSIS, ROUTINE W REFLEX MICROSCOPIC
Bacteria, UA: NONE SEEN
Bilirubin Urine: NEGATIVE
Glucose, UA: NEGATIVE mg/dL
Hgb urine dipstick: NEGATIVE
Ketones, ur: NEGATIVE mg/dL
Leukocytes,Ua: NEGATIVE
Nitrite: NEGATIVE
Protein, ur: 100 mg/dL — AB
Specific Gravity, Urine: 1.019 (ref 1.005–1.030)
pH: 5 (ref 5.0–8.0)

## 2021-06-16 LAB — C-REACTIVE PROTEIN: CRP: 0.5 mg/dL (ref <1.0)

## 2021-06-16 LAB — TSH: TSH: 1.701 u[IU]/mL (ref 0.350–4.500)

## 2021-06-16 LAB — RESP PANEL BY RT-PCR (FLU A&B, COVID) ARPGX2
Influenza A by PCR: NEGATIVE
Influenza B by PCR: NEGATIVE
SARS Coronavirus 2 by RT PCR: NEGATIVE

## 2021-06-16 LAB — TROPONIN I (HIGH SENSITIVITY): Troponin I (High Sensitivity): 37 ng/L — ABNORMAL HIGH (ref <18)

## 2021-06-16 LAB — SEDIMENTATION RATE: Sed Rate: 44 mm/hr — ABNORMAL HIGH (ref 0–22)

## 2021-06-16 MED ORDER — SODIUM CHLORIDE 0.9% FLUSH
3.0000 mL | Freq: Two times a day (BID) | INTRAVENOUS | Status: DC
  Administered 2021-06-16 – 2021-07-10 (×28): 3 mL via INTRAVENOUS

## 2021-06-16 MED ORDER — BUDESONIDE 0.25 MG/2ML IN SUSP
0.2500 mg | Freq: Two times a day (BID) | RESPIRATORY_TRACT | Status: DC
  Administered 2021-06-17 – 2021-07-10 (×41): 0.25 mg via RESPIRATORY_TRACT
  Filled 2021-06-16 (×45): qty 2

## 2021-06-16 MED ORDER — MEGESTROL ACETATE 40 MG PO TABS
40.0000 mg | ORAL_TABLET | Freq: Two times a day (BID) | ORAL | Status: DC
  Administered 2021-06-16 – 2021-07-10 (×47): 40 mg via ORAL
  Filled 2021-06-16 (×50): qty 1

## 2021-06-16 MED ORDER — CLONIDINE HCL 0.2 MG PO TABS
0.2000 mg | ORAL_TABLET | Freq: Every day | ORAL | Status: DC
  Administered 2021-06-16 – 2021-07-09 (×24): 0.2 mg via ORAL
  Filled 2021-06-16 (×24): qty 1

## 2021-06-16 MED ORDER — SODIUM BICARBONATE 650 MG PO TABS
650.0000 mg | ORAL_TABLET | Freq: Every day | ORAL | Status: DC
  Administered 2021-06-17 – 2021-06-20 (×4): 650 mg via ORAL
  Filled 2021-06-16 (×4): qty 1

## 2021-06-16 MED ORDER — ROSUVASTATIN CALCIUM 20 MG PO TABS
40.0000 mg | ORAL_TABLET | Freq: Every day | ORAL | Status: DC
  Administered 2021-06-17 – 2021-06-27 (×11): 40 mg via ORAL
  Filled 2021-06-16 (×11): qty 2

## 2021-06-16 MED ORDER — ONDANSETRON HCL 4 MG PO TABS: 4.0000 mg | ORAL_TABLET | Freq: Four times a day (QID) | ORAL | Status: DC | PRN

## 2021-06-16 MED ORDER — OXYCODONE-ACETAMINOPHEN 5-325 MG PO TABS
1.0000 | ORAL_TABLET | Freq: Once | ORAL | Status: AC
Start: 2021-06-16 — End: 2021-06-16
  Administered 2021-06-16: 1 via ORAL
  Filled 2021-06-16: qty 1

## 2021-06-16 MED ORDER — ENSURE ENLIVE PO LIQD
237.0000 mL | Freq: Two times a day (BID) | ORAL | Status: DC
  Administered 2021-06-17: 237 mL via ORAL

## 2021-06-16 MED ORDER — DULOXETINE HCL 30 MG PO CPEP
30.0000 mg | ORAL_CAPSULE | Freq: Every day | ORAL | Status: DC
  Administered 2021-06-17 – 2021-06-28 (×12): 30 mg via ORAL
  Filled 2021-06-16 (×12): qty 1

## 2021-06-16 MED ORDER — LACTATED RINGERS IV BOLUS
500.0000 mL | Freq: Once | INTRAVENOUS | Status: AC
  Administered 2021-06-16: 500 mL via INTRAVENOUS

## 2021-06-16 MED ORDER — BUDESON-GLYCOPYRROL-FORMOTEROL 160-9-4.8 MCG/ACT IN AERO: 2.0000 | INHALATION_SPRAY | Freq: Two times a day (BID) | RESPIRATORY_TRACT | Status: DC

## 2021-06-16 MED ORDER — ONDANSETRON HCL 4 MG/2ML IJ SOLN: 4.0000 mg | Freq: Four times a day (QID) | INTRAMUSCULAR | Status: DC | PRN

## 2021-06-16 MED ORDER — CARVEDILOL 12.5 MG PO TABS
12.5000 mg | ORAL_TABLET | Freq: Two times a day (BID) | ORAL | Status: DC
  Administered 2021-06-16 – 2021-07-10 (×48): 12.5 mg via ORAL
  Filled 2021-06-16 (×48): qty 1

## 2021-06-16 MED ORDER — ASPIRIN EC 81 MG PO TBEC
81.0000 mg | DELAYED_RELEASE_TABLET | Freq: Every day | ORAL | Status: DC
  Administered 2021-06-16 – 2021-07-05 (×20): 81 mg via ORAL
  Filled 2021-06-16 (×20): qty 1

## 2021-06-16 MED ORDER — VALACYCLOVIR HCL 500 MG PO TABS
1000.0000 mg | ORAL_TABLET | ORAL | Status: AC
  Administered 2021-06-16: 1000 mg via ORAL
  Filled 2021-06-16: qty 2

## 2021-06-16 MED ORDER — OXYCODONE-ACETAMINOPHEN 5-325 MG PO TABS
1.0000 | ORAL_TABLET | Freq: Once | ORAL | Status: AC
  Administered 2021-06-16: 1 via ORAL
  Filled 2021-06-16: qty 1

## 2021-06-16 MED ORDER — GABAPENTIN 100 MG PO CAPS
200.0000 mg | ORAL_CAPSULE | Freq: Every day | ORAL | Status: DC
  Administered 2021-06-16 – 2021-06-17 (×2): 200 mg via ORAL
  Filled 2021-06-16 (×2): qty 2

## 2021-06-16 MED ORDER — HYDRALAZINE HCL 20 MG/ML IJ SOLN
10.0000 mg | INTRAMUSCULAR | Status: DC | PRN
  Administered 2021-06-16 (×2): 10 mg via INTRAVENOUS
  Filled 2021-06-16 (×2): qty 1

## 2021-06-16 MED ORDER — GABAPENTIN 100 MG PO CAPS
100.0000 mg | ORAL_CAPSULE | Freq: Two times a day (BID) | ORAL | Status: DC
  Administered 2021-06-17 – 2021-06-18 (×3): 100 mg via ORAL
  Filled 2021-06-16 (×3): qty 1

## 2021-06-16 MED ORDER — ACETAMINOPHEN 650 MG RE SUPP
650.0000 mg | Freq: Four times a day (QID) | RECTAL | Status: DC | PRN
Start: 2021-06-16 — End: 2021-06-21

## 2021-06-16 MED ORDER — ASPIRIN EC 81 MG PO TBEC: 81.0000 mg | DELAYED_RELEASE_TABLET | Freq: Every day | ORAL | Status: DC

## 2021-06-16 MED ORDER — VALACYCLOVIR HCL 500 MG PO TABS
1000.0000 mg | ORAL_TABLET | Freq: Every day | ORAL | Status: AC
  Administered 2021-06-17 – 2021-06-22 (×6): 1000 mg via ORAL
  Filled 2021-06-16 (×7): qty 2

## 2021-06-16 MED ORDER — GABAPENTIN 100 MG PO CAPS: 100.0000 mg | ORAL_CAPSULE | Freq: Two times a day (BID) | ORAL | Status: DC

## 2021-06-16 MED ORDER — OXYCODONE-ACETAMINOPHEN 5-325 MG PO TABS
1.0000 | ORAL_TABLET | ORAL | Status: DC | PRN
  Administered 2021-06-16 – 2021-06-20 (×17): 1 via ORAL
  Filled 2021-06-16 (×17): qty 1

## 2021-06-16 MED ORDER — AMLODIPINE BESYLATE 5 MG PO TABS
7.5000 mg | ORAL_TABLET | Freq: Every day | ORAL | Status: DC
  Administered 2021-06-16 – 2021-06-19 (×4): 7.5 mg via ORAL
  Filled 2021-06-16 (×4): qty 2

## 2021-06-16 MED ORDER — CLOPIDOGREL BISULFATE 75 MG PO TABS
75.0000 mg | ORAL_TABLET | Freq: Every day | ORAL | Status: DC
  Administered 2021-06-17 – 2021-07-05 (×19): 75 mg via ORAL
  Filled 2021-06-16 (×19): qty 1

## 2021-06-16 MED ORDER — ALBUTEROL SULFATE (2.5 MG/3ML) 0.083% IN NEBU: 2.5000 mg | INHALATION_SOLUTION | RESPIRATORY_TRACT | Status: DC | PRN

## 2021-06-16 MED ORDER — ACETAMINOPHEN 325 MG PO TABS
650.0000 mg | ORAL_TABLET | Freq: Four times a day (QID) | ORAL | Status: DC | PRN
  Administered 2021-06-17 – 2021-06-19 (×2): 650 mg via ORAL
  Filled 2021-06-16 (×2): qty 2

## 2021-06-16 MED ORDER — DONEPEZIL HCL 5 MG PO TABS
5.0000 mg | ORAL_TABLET | Freq: Every day | ORAL | Status: DC
  Administered 2021-06-16 – 2021-07-09 (×24): 5 mg via ORAL
  Filled 2021-06-16 (×25): qty 1

## 2021-06-16 MED ORDER — REVEFENACIN 175 MCG/3ML IN SOLN
175.0000 ug | Freq: Every day | RESPIRATORY_TRACT | Status: DC
  Administered 2021-06-18 – 2021-07-10 (×18): 175 ug via RESPIRATORY_TRACT
  Filled 2021-06-16 (×26): qty 3

## 2021-06-16 MED ORDER — ARFORMOTEROL TARTRATE 15 MCG/2ML IN NEBU
15.0000 ug | INHALATION_SOLUTION | Freq: Two times a day (BID) | RESPIRATORY_TRACT | Status: DC
  Administered 2021-06-17 – 2021-07-10 (×41): 15 ug via RESPIRATORY_TRACT
  Filled 2021-06-16 (×44): qty 2

## 2021-06-16 MED ORDER — HEPARIN SODIUM (PORCINE) 5000 UNIT/ML IJ SOLN
5000.0000 [IU] | Freq: Three times a day (TID) | INTRAMUSCULAR | Status: DC
  Administered 2021-06-16 – 2021-07-05 (×57): 5000 [IU] via SUBCUTANEOUS
  Filled 2021-06-16 (×55): qty 1

## 2021-06-16 NOTE — Telephone Encounter (Signed)
4 week appt made. Order for Chest CT to be done in 4 weeks prior to appt placed.   Nothing further needed at this time.

## 2021-06-16 NOTE — ED Provider Notes (Addendum)
[facility] EMERGENCY DEPARTMENT Provider Note   CSN: [id number] Arrival date & time: 06/15/21  1710     History Chief Complaint  Patient presents with   Generalized Body Aches    Alyssa [name] is a 80 y.o. female.  The history is provided by the patient.  Chest Pain Pain location:  Substernal area Pain quality: dull   Pain radiates to:  Does not radiate Pain severity:  Moderate Onset quality:  Gradual Timing:  Constant Progression:  Unchanged Chronicity:  New Context: at rest   Relieved by:  Nothing Ineffective treatments:  None tried Associated symptoms: shortness of breath   Associated symptoms: no nausea   Associated symptoms comment:  Abdominal wall and L hip and low back pain  Risk factors: high cholesterol and hypertension   Patient with complex PMH presents with chest pain, and associated several months of worsening SOB.  No f/c/r.      Past Medical History:  Diagnosis Date   Acute respiratory failure with hypoxia ([place]) 11/01/2019   Anxiety    Arthritis    Chest pain    Chronic kidney disease    Dyspnea    Elevated troponin 12/13/2018   Headache(784.0)    Hyperlipidemia    Hypertension    Joint pain    Leg pain    Peripheral neuropathy    Peripheral vascular disease ([place])    Syncope 09/2020    Patient Active Problem List   Diagnosis Date Noted   COPD  GOLD 0/ still smoking  09/26/2020   Cigarette smoker 09/26/2020   Shortness of breath 07/06/2020   Essential hypertension 07/06/2020   Abdominal pain 07/06/2020   Elevated INR 07/06/2020   Acute respiratory failure due to COVID-19 [facility]) 06/02/2020   Acute renal failure superimposed on chronic kidney disease ([place]) 02/06/2020   Leukocytosis 02/06/2020   Thrombocytosis 02/06/2020   Palliative care encounter    Constipation 01/05/2020   Protein-calorie malnutrition, severe ([place]) 01/04/2020   Acute on chronic renal failure ([place]) 01/04/2020   Hyperkalemia 01/03/2020   AKI  (acute kidney injury) ([place]) 01/03/2020   Acute respiratory failure with hypoxia ([place]) 11/01/2019   UTI (urinary tract infection) 11/01/2019   CAP (community acquired pneumonia) 11/01/2019   Acute cystitis without hematuria    Encephalopathy 09/18/2019   SAH (subarachnoid hemorrhage) ([place]) 09/18/2019   Dementia ([place]) 12/87/8676   Metabolic acidosis 72/06/4708   ARF (acute renal failure) ([place]) 09/17/2019   Acute encephalopathy 09/17/2019   Abnormal CT scan, colon    Diarrhea    Diverticulitis 06/07/2019   Depression with anxiety 06/07/2019   Volume overload 03/23/2019   Edema 03/23/2019   Acute renal failure (ARF) ([place]) 03/08/2019   Hypokalemia 03/08/2019   Palliative care by specialist    Goals of care, counseling/discussion    Altered mental status    Encephalopathy, toxic 12/24/2018   Aspiration pneumonia of both lower lobes due to gastric secretions (HCC) 12/24/2018   Non-ST elevation (NSTEMI) myocardial infarction [facility])    Chest pain    Dyspnea on exertion    Elevated troponin level 12/09/2018   Chronic migraine 03/27/2018   Lumbar radiculopathy 03/27/2018   Gait abnormality 03/27/2018   CKD (chronic kidney disease) stage 4, GFR 15-29 ml/min (HCC) 12/28/2016   Anemia 12/28/2016   Chronic pain syndrome 12/28/2016   Benzodiazepine withdrawal without complication ([place]) 62/83/6629   Chronic right shoulder pain 12/06/2016   Fall 09/17/2016   Fracture of humeral head, closed, right, initial encounter 09/17/2016  Rib fractures 09/17/2016   Multiple falls 09/17/2016   Severe muscle deconditioning 09/17/2016   Failure to thrive in adult 09/17/2016   Melena 04/25/2016   Transient hypotension 04/25/2016   Anxiety state 04/25/2016   Tobacco abuse 04/25/2016   Hyperlipidemia 04/25/2016   Syncope 04/24/2016   Near syncope 04/24/2016   Gait difficulty 01/12/2016   Foot drop, left 01/12/2016   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    Spondylolisthesis at L4-L5 level 07/30/2015   PVD (peripheral vascular disease) ([place]) 07/29/2014   Weakness-Bilateral arm/leg 07/29/2014   Numbness-left leg 07/29/2014   Swelling of limb-Legs 07/29/2014   Atherosclerosis of native arteries of the extremities with intermittent claudication 09/30/2011    Past Surgical History:  Procedure Laterality Date   ABDOMINAL ANGIOGRAM N/A 10/29/2011   Procedure: ABDOMINAL Cyril Loosen;  Surgeon: Elam Dutch, MD;  Location: [facility] CATH LAB;  Service: Cardiovascular;  Laterality: N/A;   BACK SURGERY     FEMORAL-FEMORAL BYPASS GRAFT  08/31/2010   FLEXIBLE SIGMOIDOSCOPY N/A 06/12/2019   Procedure: FLEXIBLE SIGMOIDOSCOPY;  Surgeon: Ladene Artist, MD;  Location: [place];  Service: Endoscopy;  Laterality: N/A;  Patient had little bit to eat this morning so this will be unsedated.   JOINT REPLACEMENT Right 2012   Hip   LOWER EXTREMITY ANGIOGRAPHY  06/14/2017   Procedure: Lower Extremity Angiography;  Surgeon: Adrian Prows, MD;  Location: [place] CV LAB;  Service: Cardiovascular;;  Bilateral limited angio performed   RENAL ANGIOGRAPHY N/A 06/14/2017   Procedure: Renal Angiography;  Surgeon: Adrian Prows, MD;  Location: [place] CV LAB;  Service: Cardiovascular;  Laterality: N/A;   TOTAL HIP ARTHROPLASTY     right     OB History   No obstetric history on file.     Family History  Problem Relation Age of Onset   Heart attack Mother    Heart disease Mother    Hyperlipidemia Mother    Hypertension Mother    Heart attack Father    Heart disease Father        Before age 36   Hyperlipidemia Father    Hypertension Father    Cancer Sister        brain tumor   Aneurysm Brother        brain   Colon cancer Neg Hx    Liver cancer Neg Hx     Social History   Tobacco Use   Smoking status: Every Day    Packs/day: 1.00    Years: 50.00    Pack years: 50.00    Types: Cigarettes   Smokeless tobacco: Never   Tobacco comments:    1/4 ppd  12/05/20//lmr  Vaping Use   Vaping Use: Never used  Substance Use Topics   Alcohol use: No   Drug use: No    Home Medications Prior to Admission medications   Medication Sig Start Date End Date Taking? Authorizing Provider  acetaminophen (TYLENOL) 325 MG tablet Take 2 tablets (650 mg total) by mouth every 6 (six) hours as needed for mild pain, moderate pain or fever (or Fever >/= 101). 06/16/19   Hongalgi, Lenis Dickinson, MD  albuterol (VENTOLIN HFA) 108 (90 Base) MCG/ACT inhaler Inhale 2 puffs into the lungs every 4 (four) hours as needed for wheezing or shortness of breath.     [provider]  amLODipine (NORVASC) 5 MG tablet Take 7.5 mg by mouth at bedtime.    [provider]  aspirin 81 MG EC  tablet Take 1 tablet (81 mg total) by mouth daily. 01/09/19   Caren Griffins, MD  Budeson-Glycopyrrol-Formoterol (BREZTRI AEROSPHERE) 160-9-4.8 MCG/ACT AERO Inhale 2 puffs into the lungs 2 (two) times daily. 09/25/20   Tanda Rockers, MD  carvedilol (COREG) 12.5 MG tablet Take 12.5 mg by mouth 2 (two) times daily. 09/05/20   [provider]  cloNIDine (CATAPRES) 0.2 MG tablet Take 0.2 mg by mouth at bedtime. 05/25/20   [provider]  clopidogrel (PLAVIX) 75 MG tablet Take 1 tablet (75 mg total) by mouth daily. 01/09/19   Caren Griffins, MD  diclofenac Sodium (VOLTAREN) 1 % GEL Apply 2 g topically 4 (four) times daily as needed (pain).  01/15/20   [provider]  dicyclomine (BENTYL) 20 MG tablet Take 20 mg by mouth 3 (three) times daily. 04/01/20   [provider]  donepezil (ARICEPT) 5 MG tablet Take 1 tablet (5 mg total) by mouth daily. 11/20/20   Cameron Sprang, MD  DULoxetine (CYMBALTA) 30 MG capsule Take 30 mg by mouth daily.    [provider]  feeding supplement, ENSURE ENLIVE, (ENSURE ENLIVE) LIQD Take 237 mLs by mouth 2 (two) times daily between meals. 06/16/19   Hongalgi, Lenis Dickinson, MD  Ferrous Sulfate (IRON HIGH-POTENCY) 325 MG TABS  Take 325 mg by mouth daily with breakfast. 06/30/19   Hongalgi, Lenis Dickinson, MD  gabapentin (NEURONTIN) 100 MG capsule Take 1 tablet in morning, 1 tablet at noon, 2 tablets at night 11/20/20   Cameron Sprang, MD  hydrOXYzine (ATARAX/VISTARIL) 25 MG tablet Take 25 mg by mouth at bedtime as needed (sleep).  05/12/20   [provider]  magnesium oxide (MAG-OX) 400 MG tablet Take 400 mg by mouth 2 (two) times daily.    [provider]  megestrol (MEGACE) 40 MG tablet Take 40 mg by mouth 2 (two) times daily. 05/12/20   [provider]  methocarbamol (ROBAXIN) 500 MG tablet Take 500 mg by mouth 3 (three) times daily as needed (back spasms).  05/12/20   [provider]  Multiple Vitamin (MULTIVITAMIN WITH MINERALS) TABS tablet Take 1 tablet by mouth daily. 06/17/19   Hongalgi, Lenis Dickinson, MD  nitroGLYCERIN (NITROSTAT) 0.4 MG SL tablet Place 1 tablet (0.4 mg total) under the tongue every 5 (five) minutes as needed for chest pain. 01/07/20   Allie Bossier, MD  ondansetron (ZOFRAN ODT) 4 MG disintegrating tablet Take 1 tablet (4 mg total) by mouth every 4 (four) hours as needed for nausea or vomiting. 06/09/20   Charlesetta Shanks, MD  pantoprazole (PROTONIX) 40 MG tablet Take 1 tablet (40 mg total) by mouth daily. 06/16/19   Hongalgi, Lenis Dickinson, MD  rosuvastatin (CRESTOR) 40 MG tablet Take 1 tablet (40 mg total) by mouth daily. 10/03/20   Jennye Boroughs, MD  sodium bicarbonate 650 MG tablet Take 2 tablets (1,300 mg total) by mouth 2 (two) times daily. Patient taking differently: Take 650 mg by mouth daily. 01/07/20   Allie Bossier, MD  Vitamin D, Ergocalciferol, (DRISDOL) 1.25 MG (50000 UNIT) CAPS capsule Take 50,000 Units by mouth every Monday.    [provider]    Allergies    Patient has no known allergies.  Review of Systems   Review of Systems  Respiratory:  Positive for shortness of breath.   Cardiovascular:  Positive for chest pain.  Gastrointestinal:  Negative for  nausea.   Physical Exam Updated Vital Signs BP (!) 174/107 (BP Location: Left  Arm)   Pulse 86   Temp 98.1 F (36.7 C) (Oral)   Resp (!) 24   SpO2 97%   Physical Exam Vitals and nursing note reviewed.  Constitutional:      General: She is not in acute distress.    Appearance: Normal appearance.  HENT:     Head: Normocephalic and atraumatic.     Nose: Nose normal.  Eyes:     Conjunctiva/sclera: Conjunctivae normal.     Pupils: Pupils are equal, round, and reactive to light.  Cardiovascular:     Rate and Rhythm: Normal rate and regular rhythm.     Pulses: Normal pulses.     Heart sounds: Normal heart sounds.  Pulmonary:     Effort: Pulmonary effort is normal.     Breath sounds: Normal breath sounds.  Abdominal:     General: Abdomen is flat. Bowel sounds are normal.     Palpations: Abdomen is soft.     Tenderness: There is no abdominal tenderness. There is no guarding.  Musculoskeletal:        General: Normal range of motion.     Cervical back: Normal range of motion and neck supple.  Skin:    General: Skin is warm and dry.     Capillary Refill: Capillary refill takes less than 2 seconds.  Neurological:     General: No focal deficit present.     Mental Status: She is alert and oriented to person, place, and time.     Deep Tendon Reflexes: Reflexes normal.  Psychiatric:        Mood and Affect: Mood normal.        Behavior: Behavior normal.    ED Results / Procedures / Treatments   Labs (all labs ordered are listed, but only abnormal results are displayed) Results for orders placed or performed during the hospital encounter of 06/15/21  Resp Panel by RT-PCR (Flu A&B, Covid) Nasopharyngeal Swab   Specimen: Nasopharyngeal Swab; Nasopharyngeal(NP) swabs in vial transport medium  Result Value Ref Range   SARS Coronavirus 2 by RT PCR NEGATIVE NEGATIVE   Influenza A by PCR NEGATIVE NEGATIVE   Influenza B by PCR NEGATIVE NEGATIVE  Comprehensive metabolic panel  Result  Value Ref Range   Sodium 136 135 - 145 mmol/L   Potassium 4.8 3.5 - 5.1 mmol/L   Chloride 108 98 - 111 mmol/L   CO2 16 (L) 22 - 32 mmol/L   Glucose, Bld 98 70 - 99 mg/dL   BUN 32 (H) 8 - 23 mg/dL   Creatinine, Ser 3.17 (H) 0.44 - 1.00 mg/dL   Calcium 9.6 8.9 - 10.3 mg/dL   Total Protein 7.1 6.5 - 8.1 g/dL   Albumin 3.9 3.5 - 5.0 g/dL   AST 36 15 - 41 U/L   ALT 46 (H) 0 - 44 U/L   Alkaline Phosphatase 96 38 - 126 U/L   Total Bilirubin 0.7 0.3 - 1.2 mg/dL   GFR, Estimated 15 (L) >60 mL/min   Anion gap 12 5 - 15  CBC with Differential  Result Value Ref Range   WBC 7.0 4.0 - 10.5 K/uL   RBC 3.67 (L) 3.87 - 5.11 MIL/uL   Hemoglobin 10.2 (L) 12.0 - 15.0 g/dL   HCT 31.7 (L) 36.0 - 46.0 %   MCV 86.4 80.0 - 100.0 fL   MCH 27.8 26.0 - 34.0 pg   MCHC 32.2 30.0 - 36.0 g/dL   RDW 16.4 (H) 11.5 - 15.5 %  Platelets 331 150 - 400 K/uL   nRBC 0.0 0.0 - 0.2 %   Neutrophils Relative % 57 %   Neutro Abs 4.1 1.7 - 7.7 K/uL   Lymphocytes Relative 19 %   Lymphs Abs 1.3 0.7 - 4.0 K/uL   Monocytes Relative 17 %   Monocytes Absolute 1.2 (H) 0.1 - 1.0 K/uL   Eosinophils Relative 4 %   Eosinophils Absolute 0.3 0.0 - 0.5 K/uL   Basophils Relative 2 %   Basophils Absolute 0.1 0.0 - 0.1 K/uL   Immature Granulocytes 1 %   Abs Immature Granulocytes 0.05 0.00 - 0.07 K/uL  Lipase, blood  Result Value Ref Range   Lipase 21 11 - 51 U/L  Troponin I (High Sensitivity)  Result Value Ref Range   Troponin I (High Sensitivity) 31 (H) <18 ng/L   CT ABDOMEN PELVIS WO CONTRAST  Result Date: 06/16/2021 CLINICAL DATA:  Abdominal pain. EXAM: CT ABDOMEN AND PELVIS WITHOUT CONTRAST TECHNIQUE: Multidetector CT imaging of the abdomen and pelvis was performed following the standard protocol without IV contrast. COMPARISON:  CT of the abdomen pelvis dated 07/06/2020. FINDINGS: Evaluation of this exam is limited in the absence of intravenous contrast. Lower chest: There is consolidative changes of the visualized left  lower lobe with volume loss consistent with complete atelectasis. A centrally occlusive lesion is not excluded. Chest CT may provide better evaluation if there is clinical concern for and occlusive mass. Coronary vascular calcification. Intra-abdominal free air or free fluid. Hepatobiliary: The liver is unremarkable. No intrahepatic biliary ductal dilatation. The gallbladder is unremarkable. Pancreas: Moderate atrophy of the pancreas. Spleen: Normal in size without focal abnormality. Adrenals/Urinary Tract: The adrenal glands are unremarkable moderate right renal parenchyma atrophy. Faint small hypodense lesions in the left kidney are not characterized on this CT. There is no hydronephrosis or nephrolithiasis on either side. The visualized ureters and urinary bladder appear unremarkable. Stomach/Bowel: There is moderate size hiatal hernia. There is sigmoid diverticulosis without active inflammatory changes. There is no bowel obstruction or active inflammation. The appendix is normal. Vascular/Lymphatic: Advanced aortoiliac atherosclerotic disease. The IVC is grossly unremarkable. No pain venous gas. There is no adenopathy. A femoral femoral bypass graft is noted. There has been interval increase in the size of the fluid collection or pseudoaneurysm in the bypass anastomosis in the right groin measuring 4.8 x 4.1 cm (previously 2.6 x 2.6 cm). Evaluation of the vasculature is limited in the absence of contrast. Reproductive: Calcified uterine fibroid. Other: None Musculoskeletal: Osteopenia with degenerative changes of the spine. Lower lumbar fusion and right hip arthroplasty. No acute osseous pathology. IMPRESSION: 1. No acute intra-abdominal or pelvic pathology. 2. Sigmoid diverticulosis. No bowel obstruction. Normal appendix. 3. Interval increase in the size of the fluid collection or pseudoaneurysm in the bypass anastomosis in the right groin. 4. Complete atelectasis of the visualized left lower lobe. A centrally  occlusive lesion is not excluded. Chest CT may provide better evaluation if there is clinical concern for and occlusive mass. 5. Moderate size hiatal hernia. 6. Aortic Atherosclerosis (ICD10-I70.0). Electronically Signed   By: Anner Crete M.D.   On: 06/16/2021 01:19   DG Chest 2 View  Result Date: 06/15/2021 CLINICAL DATA:  Chest pain with difficulty breathing and abdominal pain. Subjective fever. EXAM: CHEST - 2 VIEW COMPARISON:  Radiographs 12/05/2020 and 07/06/2020.  CT 01/02/2020. FINDINGS: Evidence of new left lower lobe collapse with volume loss in the left hemithorax and mediastinal shift to the left. The right lung appears  clear. The heart size and mediastinal contours are stable with diffuse aortic tortuosity. There may be a small left pleural effusion. The bones appear unchanged. There are degenerative changes in the spine with evidence of chronic rotator cuff tear on the right. IMPRESSION: Left lower lobe collapse which could be secondary to central obstructing tumor or pneumonia. If there is clinical evidence for pneumonia, suggest short-term radiographic follow-up after appropriate antibiotic therapy. If more definitive evaluation is clinically warranted at this time, consider chest CT with contrast. Electronically Signed   By: Richardean Sale M.D.   On: 06/15/2021 18:47   CT Chest Wo Contrast  Result Date: 06/16/2021 CLINICAL DATA:  Minor chest trauma EXAM: CT CHEST WITHOUT CONTRAST TECHNIQUE: Multidetector CT imaging of the chest was performed following the standard protocol without IV contrast. COMPARISON:  01/02/2020 FINDINGS: Cardiovascular: Normal heart size. No pericardial effusion. Atheromatous calcification of the aorta and coronaries. Extensive atherosclerosis of the great vessels. Mediastinum/Nodes: Small or moderate sliding hiatal hernia. No adenopathy. Lungs/Pleura: Left lower lobe collapse with low-density lower lobe airway filling. Centrilobular emphysema. Two small pulmonary  nodules in the right lower lobe, stable and considered benign. Upper Abdomen: No acute finding. Partially covered right kidney showing atrophy. Musculoskeletal: Diffuse degenerative disc collapse. T10-11 interval disc collapse with sclerosis and endplate cysts/erosions. Advanced biforaminal impingement at this level. No soft tissue calcification or visible paravertebral edema. IMPRESSION: 1. Left lower lobe collapse. Associated airways are low-density with no visible mass or adenopathy. Recommend radiographic follow-up to clearing. 2. T10-11 advanced disc disease since 2021. No visible paravertebral inflammation typical of acute discitis, please correlate with labs. There is advanced biforaminal impingement at this level. 3. Aortic Atherosclerosis (ICD10-I70.0) and Emphysema (ICD10-J43.9). Electronically Signed   By: Monte Fantasia M.D.   On: 06/16/2021 05:29     EKG See muse       Radiology CT ABDOMEN PELVIS WO CONTRAST  Result Date: 06/16/2021 CLINICAL DATA:  Abdominal pain. EXAM: CT ABDOMEN AND PELVIS WITHOUT CONTRAST TECHNIQUE: Multidetector CT imaging of the abdomen and pelvis was performed following the standard protocol without IV contrast. COMPARISON:  CT of the abdomen pelvis dated 07/06/2020. FINDINGS: Evaluation of this exam is limited in the absence of intravenous contrast. Lower chest: There is consolidative changes of the visualized left lower lobe with volume loss consistent with complete atelectasis. A centrally occlusive lesion is not excluded. Chest CT may provide better evaluation if there is clinical concern for and occlusive mass. Coronary vascular calcification. Intra-abdominal free air or free fluid. Hepatobiliary: The liver is unremarkable. No intrahepatic biliary ductal dilatation. The gallbladder is unremarkable. Pancreas: Moderate atrophy of the pancreas. Spleen: Normal in size without focal abnormality. Adrenals/Urinary Tract: The adrenal glands are unremarkable moderate  right renal parenchyma atrophy. Faint small hypodense lesions in the left kidney are not characterized on this CT. There is no hydronephrosis or nephrolithiasis on either side. The visualized ureters and urinary bladder appear unremarkable. Stomach/Bowel: There is moderate size hiatal hernia. There is sigmoid diverticulosis without active inflammatory changes. There is no bowel obstruction or active inflammation. The appendix is normal. Vascular/Lymphatic: Advanced aortoiliac atherosclerotic disease. The IVC is grossly unremarkable. No pain venous gas. There is no adenopathy. A femoral femoral bypass graft is noted. There has been interval increase in the size of the fluid collection or pseudoaneurysm in the bypass anastomosis in the right groin measuring 4.8 x 4.1 cm (previously 2.6 x 2.6 cm). Evaluation of the vasculature is limited in the absence of contrast. Reproductive: Calcified uterine  fibroid. Other: None Musculoskeletal: Osteopenia with degenerative changes of the spine. Lower lumbar fusion and right hip arthroplasty. No acute osseous pathology. IMPRESSION: 1. No acute intra-abdominal or pelvic pathology. 2. Sigmoid diverticulosis. No bowel obstruction. Normal appendix. 3. Interval increase in the size of the fluid collection or pseudoaneurysm in the bypass anastomosis in the right groin. 4. Complete atelectasis of the visualized left lower lobe. A centrally occlusive lesion is not excluded. Chest CT may provide better evaluation if there is clinical concern for and occlusive mass. 5. Moderate size hiatal hernia. 6. Aortic Atherosclerosis (ICD10-I70.0). Electronically Signed   By: Anner Crete M.D.   On: 06/16/2021 01:19   DG Chest 2 View  Result Date: 06/15/2021 CLINICAL DATA:  Chest pain with difficulty breathing and abdominal pain. Subjective fever. EXAM: CHEST - 2 VIEW COMPARISON:  Radiographs 12/05/2020 and 07/06/2020.  CT 01/02/2020. FINDINGS: Evidence of new left lower lobe collapse with  volume loss in the left hemithorax and mediastinal shift to the left. The right lung appears clear. The heart size and mediastinal contours are stable with diffuse aortic tortuosity. There may be a small left pleural effusion. The bones appear unchanged. There are degenerative changes in the spine with evidence of chronic rotator cuff tear on the right. IMPRESSION: Left lower lobe collapse which could be secondary to central obstructing tumor or pneumonia. If there is clinical evidence for pneumonia, suggest short-term radiographic follow-up after appropriate antibiotic therapy. If more definitive evaluation is clinically warranted at this time, consider chest CT with contrast. Electronically Signed   By: Richardean Sale M.D.   On: 06/15/2021 18:47    Procedures Procedures   Medications Ordered in ED Medications  valACYclovir (VALTREX) tablet 1,000 mg (1,000 mg Oral Given 06/16/21 0543)  oxyCODONE-acetaminophen (PERCOCET/ROXICET) 5-325 MG per tablet 1 tablet (1 tablet Oral Given 06/16/21 0543)     ED Course  I have reviewed the triage vital signs and the nursing notes.  Pertinent labs & imaging results that were available during my care of the patient were reviewed by me and considered in my medical decision making (see chart for details).   AADYA KINDLER was evaluated in Emergency Department on 06/16/2021 for the symptoms described in the history of present illness. She was evaluated in the context of the global COVID-19 pandemic, which necessitated consideration that the patient might be at risk for infection with the SARS-CoV-2 virus that causes COVID-19. Institutional protocols and algorithms that pertain to the evaluation of patients at risk for COVID-19 are in a state of rapid change based on information released by regulatory bodies including the CDC and federal and state organizations. These policies and algorithms were followed during the patient's care in the ED.  Final Clinical  Impression(s) / ED Diagnoses Final diagnoses:  None   Admit to medicine  Rx / DC Orders ED Discharge Orders     None        Bambie Pizzolato, MD 06/16/21 [place], Sherly Brodbeck, MD 07/18/21 2310

## 2021-06-16 NOTE — Consult Note (Signed)
NAME:  Alyssa [name], MRN:  [id number], DOB:  09-01-1941, LOS: 0 ADMISSION DATE:  06/15/2021, CONSULTATION DATE:  06/16/21 REFERRING MD:  Dr. Tamala Julian, CHIEF COMPLAINT:  Pulmonary consult    History of Present Illness:  Alyssa [name] is a 80 y.o. female with a PMH significant for chronic hypoxic respiratory failure with COVID-19  PNA March 2022, active smoker chronic kidney disease stage IV, hyperlipidemia, hypertension, PVD, anxiety and into the emergency department with multiple complaints including chest pain and worsening shortness of breath.  Additionally patient reports rash to left flank/abdomen that progressed to back.  Also reported productive cough and intentional weight loss of 15 pounds.  Denies any fever, nausea, vomiting, or abdominal pain.  On ED arrival patient was seen hypertensive with all other vital signs within normal limits.  Lab work included creatinine 3.17, BUN 32, ALT 46, high-sensitivity troponin 31, hemoglobin 10.9.  CT abdomen/pelvis revealed no acute intra-abdominal or pelvic pathologies, sigmoid diverticulosis and pseudoaneurysm/increased fluid collection at bypass anastomosis to right groin.  CT chest also obtained and revealed left lower lobe collapse with no visible mass or adenopathy.  Pulmonary consulted given abnormal CT finding.  Pertinent  Medical History  Chronic hypoxic respiratory failure with COVID PNA March of 2022 Active smoker  Anxiety CKD stage IV Hyperlipidemia Hypertension PVD Syncope  Significant Hospital Events:  8/30 admitted per [facility] for shingles outbreak and dyspnea  Interim History / Subjective:  Seen lying on ED stretcher with no acute complaints.  States she feels slightly improved compared to admission but not yet back to baseline  Objective   Blood pressure (!) 167/92, pulse 95, temperature 98.1 F (36.7 C), temperature source Oral, resp. rate (!) 21, SpO2 99 %.       No intake or output data in the 24 hours ending  06/16/21 1019 There were no vitals filed for this visit.  Examination: General: Acute on chronic ill-appearing elderly female lying on ED stretcher in no acute distress  HEENT: [place]/AT, MM pink/moist, PERRL,  Neuro: Alert and oriented x3, nonfocal CV: s1s2 regular rate and rhythm, no murmur, rubs, or gallops,  PULM: Rhonchi on the left lower lobe cleared with requested cough creased work of breathing, oxygen saturations appropriate on room air GI: soft, bowel sounds active in all 4 quadrants, non-tender, non-distended Extremities: warm/dry, no edema  Skin: Shingles rash with open vesicles seen to left lower stomach spreading to back  Resolved Hospital Problem list     Assessment & Plan:  Left lower lobe collapse -Likely secondary to pulmonary splinting in the setting of moderate to severe pain related to shingles outbreak -Rhonchi heard to left lower lobe which resolved after patient was instructed to take a deep breath and cough Former smoker -Patient states she quit smoking 2 months ago -Follows with Dr. Melvyn Novas in the pulmonary clinic Prior COVID-19 pneumonia -March 2022, appears that she has been vaccinated and boosted P: Frequent IS and flutter valve Educated on importance of cough and deep breathe Adequate pain control to prevent further pulmonary splinting Mobilize as able No acute indications for bronchoscopy currently Need to repeat CT chest in 4 weeks to re-evaluate LLL, if collapse persist patient my need bronch  Will contact [place] [facility] to set up follow up apt   Best Practice   Per primary  Labs   CBC: Recent Labs  Lab 06/15/21 1740  WBC 7.0  NEUTROABS 4.1  HGB 10.2*  HCT 31.7*  MCV 86.4  PLT 331  Basic Metabolic Panel: Recent Labs  Lab 06/15/21 1740  NA 136  K 4.8  CL 108  CO2 16*  GLUCOSE 98  BUN 32*  CREATININE 3.17*  CALCIUM 9.6   GFR: CrCl cannot be calculated (Unknown ideal weight.). Recent Labs  Lab 06/15/21 1740  WBC 7.0    Liver  Function Tests: Recent Labs  Lab 06/15/21 1740  AST 36  ALT 46*  ALKPHOS 96  BILITOT 0.7  PROT 7.1  ALBUMIN 3.9   Recent Labs  Lab 06/15/21 1740  LIPASE 21   No results for input(s): AMMONIA in the last 168 hours.  ABG    Component Value Date/Time   PHART 7.397 07/06/2020 2147   PCO2ART 19.1 (LL) 07/06/2020 2147   PO2ART 75 (L) 07/06/2020 2147   HCO3 15.1 (L) 09/29/2020 2040   TCO2 18 (L) 10/17/2020 1230   ACIDBASEDEF 10.0 (H) 09/29/2020 2040   O2SAT 60.0 09/29/2020 2040     Coagulation Profile: No results for input(s): INR, PROTIME in the last 168 hours.  Cardiac Enzymes: No results for input(s): CKTOTAL, CKMB, CKMBINDEX, TROPONINI in the last 168 hours.  HbA1C: Hgb A1c MFr Bld  Date/Time Value Ref Range Status  06/07/2020 06:51 AM 6.2 (H) 4.8 - 5.6 % Final    Comment:    (NOTE) Pre diabetes:          5.7%-6.4%  Diabetes:              >6.4%  Glycemic control for   <7.0% adults with diabetes   09/19/2019 01:31 PM 6.5 (H) 4.8 - 5.6 % Final    Comment:    (NOTE) Pre diabetes:          5.7%-6.4% Diabetes:              >6.4% Glycemic control for   <7.0% adults with diabetes     CBG: No results for input(s): GLUCAP in the last 168 hours.  Review of Systems:   Please see the history of present illness. All other systems reviewed and are negative   Past Medical History:  She,  has a past medical history of Acute respiratory failure with hypoxia ([place]) (11/01/2019), Anxiety, Arthritis, Chest pain, Chronic kidney disease, Dyspnea, Elevated troponin (12/13/2018), Headache(784.0), Hyperlipidemia, Hypertension, Joint pain, Leg pain, Peripheral neuropathy, Peripheral vascular disease ([place]), and Syncope (09/2020).   Surgical History:   Past Surgical History:  Procedure Laterality Date   ABDOMINAL ANGIOGRAM N/A 10/29/2011   Procedure: ABDOMINAL ANGIOGRAM;  Surgeon: Elam Dutch, MD;  Location: [facility] CATH LAB;  Service: Cardiovascular;  Laterality: N/A;   BACK  SURGERY     FEMORAL-FEMORAL BYPASS GRAFT  08/31/2010   FLEXIBLE SIGMOIDOSCOPY N/A 06/12/2019   Procedure: FLEXIBLE SIGMOIDOSCOPY;  Surgeon: Ladene Artist, MD;  Location: [place];  Service: Endoscopy;  Laterality: N/A;  Patient had little bit to eat this morning so this will be unsedated.   JOINT REPLACEMENT Right 2012   Hip   LOWER EXTREMITY ANGIOGRAPHY  06/14/2017   Procedure: Lower Extremity Angiography;  Surgeon: Adrian Prows, MD;  Location: [place] CV LAB;  Service: Cardiovascular;;  Bilateral limited angio performed   RENAL ANGIOGRAPHY N/A 06/14/2017   Procedure: Renal Angiography;  Surgeon: Adrian Prows, MD;  Location: [place] CV LAB;  Service: Cardiovascular;  Laterality: N/A;   TOTAL HIP ARTHROPLASTY     right     Social History:   reports that she has been smoking cigarettes. She has a 50.00 pack-year smoking history. She  has never used smokeless tobacco. She reports that she does not drink alcohol and does not use drugs.   Family History:  Her family history includes Aneurysm in her brother; Cancer in her sister; Heart attack in her father and mother; Heart disease in her father and mother; Hyperlipidemia in her father and mother; Hypertension in her father and mother. There is no history of Colon cancer or Liver cancer.   Allergies No Known Allergies   Home Medications  Prior to Admission medications   Medication Sig Start Date End Date Taking? Authorizing Provider  acetaminophen (TYLENOL) 325 MG tablet Take 2 tablets (650 mg total) by mouth every 6 (six) hours as needed for mild pain, moderate pain or fever (or Fever >/= 101). 06/16/19   Hongalgi, Lenis Dickinson, MD  albuterol (VENTOLIN HFA) 108 (90 Base) MCG/ACT inhaler Inhale 2 puffs into the lungs every 4 (four) hours as needed for wheezing or shortness of breath.     [provider]  amLODipine (NORVASC) 5 MG tablet Take 7.5 mg by mouth at bedtime.    [provider]  aspirin 81 MG EC tablet Take 1  tablet (81 mg total) by mouth daily. 01/09/19   Caren Griffins, MD  Budeson-Glycopyrrol-Formoterol (BREZTRI AEROSPHERE) 160-9-4.8 MCG/ACT AERO Inhale 2 puffs into the lungs 2 (two) times daily. 09/25/20   Tanda Rockers, MD  carvedilol (COREG) 12.5 MG tablet Take 12.5 mg by mouth 2 (two) times daily. 09/05/20   [provider]  cloNIDine (CATAPRES) 0.2 MG tablet Take 0.2 mg by mouth at bedtime. 05/25/20   [provider]  clopidogrel (PLAVIX) 75 MG tablet Take 1 tablet (75 mg total) by mouth daily. 01/09/19   Caren Griffins, MD  diclofenac Sodium (VOLTAREN) 1 % GEL Apply 2 g topically 4 (four) times daily as needed (pain).  01/15/20   [provider]  dicyclomine (BENTYL) 20 MG tablet Take 20 mg by mouth 3 (three) times daily. 04/01/20   [provider]  donepezil (ARICEPT) 5 MG tablet Take 1 tablet (5 mg total) by mouth daily. 11/20/20   Cameron Sprang, MD  DULoxetine (CYMBALTA) 30 MG capsule Take 30 mg by mouth daily.    [provider]  feeding supplement, ENSURE ENLIVE, (ENSURE ENLIVE) LIQD Take 237 mLs by mouth 2 (two) times daily between meals. 06/16/19   Hongalgi, Lenis Dickinson, MD  Ferrous Sulfate (IRON HIGH-POTENCY) 325 MG TABS Take 325 mg by mouth daily with breakfast. 06/30/19   Hongalgi, Lenis Dickinson, MD  gabapentin (NEURONTIN) 100 MG capsule Take 1 tablet in morning, 1 tablet at noon, 2 tablets at night 11/20/20   Cameron Sprang, MD  hydrOXYzine (ATARAX/VISTARIL) 25 MG tablet Take 25 mg by mouth at bedtime as needed (sleep).  05/12/20   [provider]  magnesium oxide (MAG-OX) 400 MG tablet Take 400 mg by mouth 2 (two) times daily.    [provider]  megestrol (MEGACE) 40 MG tablet Take 40 mg by mouth 2 (two) times daily. 05/12/20   [provider]  methocarbamol (ROBAXIN) 500 MG tablet Take 500 mg by mouth 3 (three) times daily as needed (back spasms).  05/12/20   [provider]  Multiple Vitamin (MULTIVITAMIN WITH  MINERALS) TABS tablet Take 1 tablet by mouth daily. 06/17/19   Hongalgi, Lenis Dickinson, MD  nitroGLYCERIN (NITROSTAT) 0.4 MG SL tablet Place 1 tablet (0.4 mg total) under the tongue every 5 (five) minutes as needed for chest pain. 01/07/20  Allie Bossier, MD  ondansetron (ZOFRAN ODT) 4 MG disintegrating tablet Take 1 tablet (4 mg total) by mouth every 4 (four) hours as needed for nausea or vomiting. 06/09/20   Charlesetta Shanks, MD  pantoprazole (PROTONIX) 40 MG tablet Take 1 tablet (40 mg total) by mouth daily. 06/16/19   Hongalgi, Lenis Dickinson, MD  rosuvastatin (CRESTOR) 40 MG tablet Take 1 tablet (40 mg total) by mouth daily. 10/03/20   Jennye Boroughs, MD  sodium bicarbonate 650 MG tablet Take 2 tablets (1,300 mg total) by mouth 2 (two) times daily. Patient taking differently: Take 650 mg by mouth daily. 01/07/20   Allie Bossier, MD  Vitamin D, Ergocalciferol, (DRISDOL) 1.25 MG (50000 UNIT) CAPS capsule Take 50,000 Units by mouth every Monday.    [provider]     Critical care time: N/A  Coltyn Hanning D. Kenton Kingfisher, NP-C [facility] Personal contact information can be found on Amion  06/16/2021, 11:11 AM

## 2021-06-16 NOTE — ED Notes (Signed)
This RN attempted twice to get IV on patient

## 2021-06-16 NOTE — ED Notes (Signed)
Pt to CT

## 2021-06-16 NOTE — Plan of Care (Signed)
  Problem: Education: Goal: Knowledge of General Education information will improve Description: Including pain rating scale, medication(s)/side effects and non-pharmacologic comfort measures Outcome: Progressing   Problem: Coping: Goal: Level of anxiety will decrease Outcome: Progressing   

## 2021-06-16 NOTE — Discharge Planning (Signed)
Pt son called for update on pt status.  RNCM relayed that pt is to be admitted for observation.

## 2021-06-16 NOTE — H&P (Signed)
History and Physical    Alyssa [name] ION:[id number] DOB: 10-11-1941 DOA: 06/15/2021  Referring MD/NP/PA: Inda Merlin, MD PCP: [name], DO  Consultants: Christinia Gully, MD-pulmonology Patient coming from: Home  Chief Complaint: Pain all over  I have personally briefly reviewed patient's old medical records in [place]   HPI: Alyssa [name] is a 80 y.o. female with medical history significant of hypertension, hyperlipidemia, COPD, PVD, CKD stage stage IV, COVID-19 infection 2021, dementia presents with complaints of pain all over for the last 3 days.  Admits that a rash broke out on the left side of her abdomen and goes around to her back and states that it is very painful.  She states that she has been more short of breath and has had a productive cough with " cloudy" sputum production.  Associated symptoms include wheezing, chest pain, left hip pain, 15 pound weight loss over the last month, and generalized malaise.  Notes that she has not had an appetite.  Denies having any significant fever, nausea, or vomiting.  Patient has not tried anything to treat symptoms at home prior to coming to the hospital.  She reports quitting smoking about 1 month ago.  Records note patient has been having difficulty with shortness of breath for more than an months now  ED Course: Upon admission into the emergency department patient was seen to be afebrile with respirations 16-24, blood pressures elevated up to 197/109, and all other vital signs maintained.  Labs significant for hemoglobin 10.2, CO2 16, BUN 32, creatinine 3.17, and troponin 31.  Chest x-ray revealed left lower lobe collapse.  Influenza and COVID-19 screening were negative.  CT scan of the chest abdomen and pelvis was obtained.  CT scan significant for left lower lobe collapse without visible mass or adenopathy and femoral bypass graft with increased fluid collection at bypass anastomosis measuring 4.8 x 4.1 cm previously  2.6 x 2.6 cm.  Patient was given oxycodone 5 mg p.o., valacyclovir 1000 mg p.o., and lactated Ringer's 500 mL.  TRH called to admit  Review of Systems  Constitutional:  Positive for malaise/fatigue and weight loss.  Eyes:  Negative for pain.  Respiratory:  Positive for cough, sputum production, shortness of breath and wheezing.   Cardiovascular:  Positive for chest pain. Negative for leg swelling.  Gastrointestinal:  Positive for abdominal pain. Negative for vomiting.  Genitourinary:  Negative for dysuria and hematuria.  Musculoskeletal:  Positive for back pain and joint pain. Negative for myalgias.  Skin:  Positive for rash.  Neurological:  Positive for weakness. Negative for loss of consciousness.  Psychiatric/Behavioral:  Negative for substance abuse.    Past Medical History:  Diagnosis Date   Acute respiratory failure with hypoxia ([place]) 11/01/2019   Anxiety    Arthritis    Chest pain    Chronic kidney disease    Dyspnea    Elevated troponin 12/13/2018   Headache(784.0)    Hyperlipidemia    Hypertension    Joint pain    Leg pain    Peripheral neuropathy    Peripheral vascular disease ([place])    Syncope 09/2020    Past Surgical History:  Procedure Laterality Date   ABDOMINAL ANGIOGRAM N/A 10/29/2011   Procedure: ABDOMINAL ANGIOGRAM;  Surgeon: Elam Dutch, MD;  Location: [facility] CATH LAB;  Service: Cardiovascular;  Laterality: N/A;   BACK SURGERY     FEMORAL-FEMORAL BYPASS GRAFT  08/31/2010   FLEXIBLE SIGMOIDOSCOPY N/A 06/12/2019   Procedure: FLEXIBLE SIGMOIDOSCOPY;  Surgeon: Ladene Artist, MD;  Location: [facility] ENDOSCOPY;  Service: Endoscopy;  Laterality: N/A;  Patient had little bit to eat this morning so this will be unsedated.   JOINT REPLACEMENT Right 2012   Hip   LOWER EXTREMITY ANGIOGRAPHY  06/14/2017   Procedure: Lower Extremity Angiography;  Surgeon: Adrian Prows, MD;  Location: [place] CV LAB;  Service: Cardiovascular;;  Bilateral limited angio performed   RENAL  ANGIOGRAPHY N/A 06/14/2017   Procedure: Renal Angiography;  Surgeon: Adrian Prows, MD;  Location: [place] CV LAB;  Service: Cardiovascular;  Laterality: N/A;   TOTAL HIP ARTHROPLASTY     right     reports that she has been smoking cigarettes. She has a 50.00 pack-year smoking history. She has never used smokeless tobacco. She reports that she does not drink alcohol and does not use drugs.  No Known Allergies  Family History  Problem Relation Age of Onset   Heart attack Mother    Heart disease Mother    Hyperlipidemia Mother    Hypertension Mother    Heart attack Father    Heart disease Father        Before age 36   Hyperlipidemia Father    Hypertension Father    Cancer Sister        brain tumor   Aneurysm Brother        brain   Colon cancer Neg Hx    Liver cancer Neg Hx     Prior to Admission medications   Medication Sig Start Date End Date Taking? Authorizing Provider  acetaminophen (TYLENOL) 325 MG tablet Take 2 tablets (650 mg total) by mouth every 6 (six) hours as needed for mild pain, moderate pain or fever (or Fever >/= 101). 06/16/19   Hongalgi, Lenis Dickinson, MD  albuterol (VENTOLIN HFA) 108 (90 Base) MCG/ACT inhaler Inhale 2 puffs into the lungs every 4 (four) hours as needed for wheezing or shortness of breath.     [provider]  amLODipine (NORVASC) 5 MG tablet Take 7.5 mg by mouth at bedtime.    [provider]  aspirin 81 MG EC tablet Take 1 tablet (81 mg total) by mouth daily. 01/09/19   Caren Griffins, MD  Budeson-Glycopyrrol-Formoterol (BREZTRI AEROSPHERE) 160-9-4.8 MCG/ACT AERO Inhale 2 puffs into the lungs 2 (two) times daily. 09/25/20   Tanda Rockers, MD  carvedilol (COREG) 12.5 MG tablet Take 12.5 mg by mouth 2 (two) times daily. 09/05/20   [provider]  cloNIDine (CATAPRES) 0.2 MG tablet Take 0.2 mg by mouth at bedtime. 05/25/20   [provider]  clopidogrel (PLAVIX) 75 MG tablet Take 1 tablet (75 mg total) by mouth  daily. 01/09/19   Caren Griffins, MD  diclofenac Sodium (VOLTAREN) 1 % GEL Apply 2 g topically 4 (four) times daily as needed (pain).  01/15/20   [provider]  dicyclomine (BENTYL) 20 MG tablet Take 20 mg by mouth 3 (three) times daily. 04/01/20   [provider]  donepezil (ARICEPT) 5 MG tablet Take 1 tablet (5 mg total) by mouth daily. 11/20/20   Cameron Sprang, MD  DULoxetine (CYMBALTA) 30 MG capsule Take 30 mg by mouth daily.    [provider]  feeding supplement, ENSURE ENLIVE, (ENSURE ENLIVE) LIQD Take 237 mLs by mouth 2 (two) times daily between meals. 06/16/19   Modena Jansky, MD  Ferrous Sulfate (IRON HIGH-POTENCY) 325 MG TABS Take 325 mg by mouth daily with breakfast. 06/30/19  Modena Jansky, MD  gabapentin (NEURONTIN) 100 MG capsule Take 1 tablet in morning, 1 tablet at noon, 2 tablets at night 11/20/20   Cameron Sprang, MD  hydrOXYzine (ATARAX/VISTARIL) 25 MG tablet Take 25 mg by mouth at bedtime as needed (sleep).  05/12/20   [provider]  magnesium oxide (MAG-OX) 400 MG tablet Take 400 mg by mouth 2 (two) times daily.    [provider]  megestrol (MEGACE) 40 MG tablet Take 40 mg by mouth 2 (two) times daily. 05/12/20   [provider]  methocarbamol (ROBAXIN) 500 MG tablet Take 500 mg by mouth 3 (three) times daily as needed (back spasms).  05/12/20   [provider]  Multiple Vitamin (MULTIVITAMIN WITH MINERALS) TABS tablet Take 1 tablet by mouth daily. 06/17/19   Hongalgi, Lenis Dickinson, MD  nitroGLYCERIN (NITROSTAT) 0.4 MG SL tablet Place 1 tablet (0.4 mg total) under the tongue every 5 (five) minutes as needed for chest pain. 01/07/20   Allie Bossier, MD  ondansetron (ZOFRAN ODT) 4 MG disintegrating tablet Take 1 tablet (4 mg total) by mouth every 4 (four) hours as needed for nausea or vomiting. 06/09/20   Charlesetta Shanks, MD  pantoprazole (PROTONIX) 40 MG tablet Take 1 tablet (40 mg total) by mouth daily. 06/16/19    Hongalgi, Lenis Dickinson, MD  rosuvastatin (CRESTOR) 40 MG tablet Take 1 tablet (40 mg total) by mouth daily. 10/03/20   Jennye Boroughs, MD  sodium bicarbonate 650 MG tablet Take 2 tablets (1,300 mg total) by mouth 2 (two) times daily. Patient taking differently: Take 650 mg by mouth daily. 01/07/20   Allie Bossier, MD  Vitamin D, Ergocalciferol, (DRISDOL) 1.25 MG (50000 UNIT) CAPS capsule Take 50,000 Units by mouth every Monday.    [provider]    Physical Exam:  Constitutional: Elderly female who appears to be in some discomfort Vitals:   06/16/21 0615 06/16/21 0700 06/16/21 0730 06/16/21 0800  BP: (!) 178/154 (!) 177/110 (!) 186/108 (!) 187/104  Pulse: 82 82 94 93  Resp: _0 Temp:      TempSrc:      SpO2: 98% 97% 97% 97%   Eyes: PERRL, lids and conjunctivae normal ENMT: Mucous membranes are moist. Posterior pharynx clear of any exudate or lesions.   Neck: normal, supple, no masses, no thyromegaly Respiratory: Normal respiratory effort with mild expiratory wheeze appreciated.  O2 saturations currently maintained on 2 L nasal cannula oxygen. Cardiovascular: Regular rate and rhythm, no murmurs / rubs / gallops. No extremity edema. 2+ pedal pulses. No carotid bruits.  Abdomen: no tenderness, no masses palpated. No hepatosplenomegaly. Bowel sounds positive.  Musculoskeletal: no clubbing / cyanosis. No joint deformity upper and lower extremities. Good ROM, no contractures. Normal muscle tone.  Skin: Blister rash noted of approximately T7t8/9 dermatome involving left abdomen and back.  Some open wounds noted of the back Neurologic: CN 2-12 grossly intact. Sensation intact, DTR normal. Strength 5/5 in all 4.  Psychiatric: Mild memory deficit.  Alert and oriented x 3. Normal mood.     Labs on Admission: I have personally reviewed following labs and imaging studies  CBC: Recent Labs  Lab 06/15/21 1740  WBC 7.0  NEUTROABS 4.1  HGB 10.2*  HCT 31.7*  MCV 86.4  PLT 546    Basic Metabolic Panel: Recent Labs  Lab 06/15/21 1740  NA 136  K 4.8  CL 108  CO2 16*  GLUCOSE 98  BUN 32*  CREATININE 3.17*  CALCIUM 9.6   GFR: CrCl cannot be calculated (Unknown ideal weight.). Liver Function Tests: Recent Labs  Lab 06/15/21 1740  AST 36  ALT 46*  ALKPHOS 96  BILITOT 0.7  PROT 7.1  ALBUMIN 3.9   Recent Labs  Lab 06/15/21 1740  LIPASE 21   No results for input(s): AMMONIA in the last 168 hours. Coagulation Profile: No results for input(s): INR, PROTIME in the last 168 hours. Cardiac Enzymes: No results for input(s): CKTOTAL, CKMB, CKMBINDEX, TROPONINI in the last 168 hours. BNP (last 3 results) No results for input(s): PROBNP in the last 8760 hours. HbA1C: No results for input(s): HGBA1C in the last 72 hours. CBG: No results for input(s): GLUCAP in the last 168 hours. Lipid Profile: No results for input(s): CHOL, HDL, LDLCALC, TRIG, CHOLHDL, LDLDIRECT in the last 72 hours. Thyroid Function Tests: No results for input(s): TSH, T4TOTAL, FREET4, T3FREE, THYROIDAB in the last 72 hours. Anemia Panel: No results for input(s): VITAMINB12, FOLATE, FERRITIN, TIBC, IRON, RETICCTPCT in the last 72 hours. Urine analysis:    Component Value Date/Time   COLORURINE STRAW (A) 09/29/2020 2355   APPEARANCEUR CLEAR 09/29/2020 2355   LABSPEC 1.010 09/29/2020 2355   PHURINE 7.0 09/29/2020 2355   GLUCOSEU NEGATIVE 09/29/2020 2355   HGBUR NEGATIVE 09/29/2020 2355   BILIRUBINUR NEGATIVE 09/29/2020 2355   KETONESUR NEGATIVE 09/29/2020 2355   PROTEINUR 100 (A) 09/29/2020 2355   UROBILINOGEN 0.2 08/24/2015 1737   NITRITE NEGATIVE 09/29/2020 2355   LEUKOCYTESUR NEGATIVE 09/29/2020 2355   Sepsis Labs: Recent Results (from the past 240 hour(s))  Resp Panel by RT-PCR (Flu A&B, Covid) Nasopharyngeal Swab     Status: None   Collection Time: 06/15/21  5:44 PM   Specimen: Nasopharyngeal Swab; Nasopharyngeal(NP) swabs in vial transport medium  Result Value Ref  Range Status   SARS Coronavirus 2 by RT PCR NEGATIVE NEGATIVE Final    Comment: (NOTE) SARS-CoV-2 target nucleic acids are NOT DETECTED.  The SARS-CoV-2 RNA is generally detectable in upper respiratory specimens during the acute phase of infection. The lowest concentration of SARS-CoV-2 viral copies this assay can detect is 138 copies/mL. A negative result does not preclude SARS-Cov-2 infection and should not be used as the sole basis for treatment or other patient management decisions. A negative result may occur with  improper specimen collection/handling, submission of specimen other than nasopharyngeal swab, presence of viral mutation(s) within the areas targeted by this assay, and inadequate number of viral copies(<138 copies/mL). A negative result must be combined with clinical observations, patient history, and epidemiological information. The expected result is Negative.  Fact Sheet for Patients:  EntrepreneurPulse.com.au  Fact Sheet for Healthcare Providers:  IncredibleEmployment.be  This test is no t yet approved or cleared by the [place] FDA and  has been authorized for detection and/or diagnosis of SARS-CoV-2 by FDA under an Emergency Use Authorization (EUA). This EUA will remain  in effect (meaning this test can be used) for the duration of the COVID-19 declaration under Section 564(b)(1) of the Act, 21 U.S.C.section 360bbb-3(b)(1), unless the authorization is terminated  or revoked sooner.       Influenza A by PCR NEGATIVE NEGATIVE Final   Influenza B by PCR NEGATIVE NEGATIVE Final    Comment: (NOTE) The Xpert Xpress SARS-CoV-2/FLU/RSV plus assay is intended as an aid in the diagnosis of influenza from Nasopharyngeal swab specimens and should not be used as a sole basis for treatment. Nasal washings and aspirates are unacceptable for Xpert  Xpress SARS-CoV-2/FLU/RSV testing.  Fact Sheet for  Patients: EntrepreneurPulse.com.au  Fact Sheet for Healthcare Providers: IncredibleEmployment.be  This test is not yet approved or cleared by the [place] FDA and has been authorized for detection and/or diagnosis of SARS-CoV-2 by FDA under an Emergency Use Authorization (EUA). This EUA will remain in effect (meaning this test can be used) for the duration of the COVID-19 declaration under Section 564(b)(1) of the Act, 21 U.S.C. section 360bbb-3(b)(1), unless the authorization is terminated or revoked.  Performed at [facility], [place] [address] [place], [place] [address]      Radiological Exams on Admission: CT ABDOMEN PELVIS WO CONTRAST  Result Date: 06/16/2021 CLINICAL DATA:  Abdominal pain. EXAM: CT ABDOMEN AND PELVIS WITHOUT CONTRAST TECHNIQUE: Multidetector CT imaging of the abdomen and pelvis was performed following the standard protocol without IV contrast. COMPARISON:  CT of the abdomen pelvis dated 07/06/2020. FINDINGS: Evaluation of this exam is limited in the absence of intravenous contrast. Lower chest: There is consolidative changes of the visualized left lower lobe with volume loss consistent with complete atelectasis. A centrally occlusive lesion is not excluded. Chest CT may provide better evaluation if there is clinical concern for and occlusive mass. Coronary vascular calcification. Intra-abdominal free air or free fluid. Hepatobiliary: The liver is unremarkable. No intrahepatic biliary ductal dilatation. The gallbladder is unremarkable. Pancreas: Moderate atrophy of the pancreas. Spleen: Normal in size without focal abnormality. Adrenals/Urinary Tract: The adrenal glands are unremarkable moderate right renal parenchyma atrophy. Faint small hypodense lesions in the left kidney are not characterized on this CT. There is no hydronephrosis or nephrolithiasis on either side. The visualized ureters and urinary bladder appear  unremarkable. Stomach/Bowel: There is moderate size hiatal hernia. There is sigmoid diverticulosis without active inflammatory changes. There is no bowel obstruction or active inflammation. The appendix is normal. Vascular/Lymphatic: Advanced aortoiliac atherosclerotic disease. The IVC is grossly unremarkable. No pain venous gas. There is no adenopathy. A femoral femoral bypass graft is noted. There has been interval increase in the size of the fluid collection or pseudoaneurysm in the bypass anastomosis in the right groin measuring 4.8 x 4.1 cm (previously 2.6 x 2.6 cm). Evaluation of the vasculature is limited in the absence of contrast. Reproductive: Calcified uterine fibroid. Other: None Musculoskeletal: Osteopenia with degenerative changes of the spine. Lower lumbar fusion and right hip arthroplasty. No acute osseous pathology. IMPRESSION: 1. No acute intra-abdominal or pelvic pathology. 2. Sigmoid diverticulosis. No bowel obstruction. Normal appendix. 3. Interval increase in the size of the fluid collection or pseudoaneurysm in the bypass anastomosis in the right groin. 4. Complete atelectasis of the visualized left lower lobe. A centrally occlusive lesion is not excluded. Chest CT may provide better evaluation if there is clinical concern for and occlusive mass. 5. Moderate size hiatal hernia. 6. Aortic Atherosclerosis (ICD10-I70.0). Electronically Signed   By: Anner Crete M.D.   On: 06/16/2021 01:19   DG Chest 2 View  Result Date: 06/15/2021 CLINICAL DATA:  Chest pain with difficulty breathing and abdominal pain. Subjective fever. EXAM: CHEST - 2 VIEW COMPARISON:  Radiographs 12/05/2020 and 07/06/2020.  CT 01/02/2020. FINDINGS: Evidence of new left lower lobe collapse with volume loss in the left hemithorax and mediastinal shift to the left. The right lung appears clear. The heart size and mediastinal contours are stable with diffuse aortic tortuosity. There may be a small left pleural effusion. The  bones appear unchanged. There are degenerative changes in the spine with evidence of chronic rotator  cuff tear on the right. IMPRESSION: Left lower lobe collapse which could be secondary to central obstructing tumor or pneumonia. If there is clinical evidence for pneumonia, suggest short-term radiographic follow-up after appropriate antibiotic therapy. If more definitive evaluation is clinically warranted at this time, consider chest CT with contrast. Electronically Signed   By: Richardean Sale M.D.   On: 06/15/2021 18:47   CT Chest Wo Contrast  Result Date: 06/16/2021 CLINICAL DATA:  Minor chest trauma EXAM: CT CHEST WITHOUT CONTRAST TECHNIQUE: Multidetector CT imaging of the chest was performed following the standard protocol without IV contrast. COMPARISON:  01/02/2020 FINDINGS: Cardiovascular: Normal heart size. No pericardial effusion. Atheromatous calcification of the aorta and coronaries. Extensive atherosclerosis of the great vessels. Mediastinum/Nodes: Small or moderate sliding hiatal hernia. No adenopathy. Lungs/Pleura: Left lower lobe collapse with low-density lower lobe airway filling. Centrilobular emphysema. Two small pulmonary nodules in the right lower lobe, stable and considered benign. Upper Abdomen: No acute finding. Partially covered right kidney showing atrophy. Musculoskeletal: Diffuse degenerative disc collapse. T10-11 interval disc collapse with sclerosis and endplate cysts/erosions. Advanced biforaminal impingement at this level. No soft tissue calcification or visible paravertebral edema. IMPRESSION: 1. Left lower lobe collapse. Associated airways are low-density with no visible mass or adenopathy. Recommend radiographic follow-up to clearing. 2. T10-11 advanced disc disease since 2021. No visible paravertebral inflammation typical of acute discitis, please correlate with labs. There is advanced biforaminal impingement at this level. 3. Aortic Atherosclerosis (ICD10-I70.0) and Emphysema  (ICD10-J43.9). Electronically Signed   By: Monte Fantasia M.D.   On: 06/16/2021 05:29    EKG: Independently reviewed.  Normal sinus rhythm at 71 bpm with rightward axis similar to previous  Assessment/Plan Herpes zoster: Acute.  Patient presents with complaints of pain all over for the last 3 days.  Found to have a blister rash of the T8/9 dermatome with open lesion present.  Patient had been given valacyclovir 1000 mg p.o. -Admit to a medical telemetry bed -Airborne/contact precautions -Continue valacyclovir adjusted for kidney function -Oxycodone as needed for pain  Dyspnea secondary to left lung collapse COPD without exacerbation: Imaging studies revealed left lung collapse with no discernible obstructing lesion appreciated.  CT scan was unable to be done with contrast due to patient's kidney function.  Pulmonary function studies performed with Dr. Melvyn Novas noted mild obstructive pulmonary disease in March. Question left lug collaspe malignancy vs. pain from zoster. -Check ESR and CRP -Albuterol nebs as needed for shortness of breath/wheeze -PCCM consulted, we will follow-up for any further recommendation  Hypertensive urgency: Acute.  On admission blood pressures noted to be elevated up to 197/109.  Home blood pressure regimen included amlodipine 7.5 mg daily, Coreg 12.5 mg twice daily, and clonidine 0.2 mg nightly. -Continue home medication regimen -Hydralazine IV as needed for elevated blood pressures  Weight loss: Patient reports 15 pound weight loss in the last month. -Check TSH  Elevated troponin: Chronic.  High-sensitivity troponin initially 31.  Records note patient's troponin has been chronically elevated in the past. -Recheck high-sensitivity troponin  Peripheral vascular disease fluid collection: CT imaging did note concern for increased in size of a fluid collection at bypass anastomosis measuring 4.8 x 4.1 cm. -Continue Plavix, ASA, and statin Consider disscussing with  vascular tomorrow  Non-anion gap metabolic acidosis: Chronic.  On admission CO2 16 with anion gap of 12. -Resume sodium bicarb tablets  Normocytic anemia: Chronic.  Hemoglobin 10.2 g/dL which appears near previous baseline with elevated RDW of 16.4. -Check iron studies and repeat  CBC in a.m.  Dementia: Previously noted to be mild. -Continue Aricept  Chronic kidney disease stage IV: Creatinine 3.17 with BUN 32.  Patient reports being followed in the outpatient setting by Dr. Moshe Cipro.  Patient reports that she has not been evaluated for need of dialysis. -Continue to monitor kidney function daily -Recommend outpatient follow-up with Dr. Moshe Cipro  Anxiety and depression -Continue Cymbalta  History of tobacco abuse: Patient reports quitting smoking approximately 1 month ago, but reported smoking for approximately 20 years.  DVT prophylaxis: Heparin Code Status: full Family Communication: Son was not available over the phone Disposition Plan: To be determined Consults called: PCCM Admission status: Observation   Norval Morton MD [facility]   If 7PM-7AM, please contact night-coverage   06/16/2021, 8:16 AM

## 2021-06-17 DIAGNOSIS — E440 Moderate protein-calorie malnutrition: Secondary | ICD-10-CM | POA: Insufficient documentation

## 2021-06-17 DIAGNOSIS — R0602 Shortness of breath: Secondary | ICD-10-CM | POA: Diagnosis not present

## 2021-06-17 LAB — COMPREHENSIVE METABOLIC PANEL
ALT: 39 U/L (ref 0–44)
AST: 30 U/L (ref 15–41)
Albumin: 3.6 g/dL (ref 3.5–5.0)
Alkaline Phosphatase: 93 U/L (ref 38–126)
Anion gap: 13 (ref 5–15)
BUN: 39 mg/dL — ABNORMAL HIGH (ref 8–23)
CO2: 12 mmol/L — ABNORMAL LOW (ref 22–32)
Calcium: 9.8 mg/dL (ref 8.9–10.3)
Chloride: 110 mmol/L (ref 98–111)
Creatinine, Ser: 2.99 mg/dL — ABNORMAL HIGH (ref 0.44–1.00)
GFR, Estimated: 16 mL/min — ABNORMAL LOW (ref >60)
Glucose, Bld: 128 mg/dL — ABNORMAL HIGH (ref 70–99)
Potassium: 5.2 mmol/L — ABNORMAL HIGH (ref 3.5–5.1)
Sodium: 135 mmol/L (ref 135–145)
Total Bilirubin: 0.6 mg/dL (ref 0.3–1.2)
Total Protein: 6.8 g/dL (ref 6.5–8.1)

## 2021-06-17 LAB — FERRITIN: Ferritin: 1455 ng/mL — ABNORMAL HIGH (ref 11–307)

## 2021-06-17 LAB — CBC
HCT: 28.9 % — ABNORMAL LOW (ref 36.0–46.0)
Hemoglobin: 9.7 g/dL — ABNORMAL LOW (ref 12.0–15.0)
MCH: 27.6 pg (ref 26.0–34.0)
MCHC: 33.6 g/dL (ref 30.0–36.0)
MCV: 82.3 fL (ref 80.0–100.0)
Platelets: 321 10*3/uL (ref 150–400)
RBC: 3.51 MIL/uL — ABNORMAL LOW (ref 3.87–5.11)
RDW: 16.4 % — ABNORMAL HIGH (ref 11.5–15.5)
WBC: 9.3 10*3/uL (ref 4.0–10.5)
nRBC: 0 % (ref 0.0–0.2)

## 2021-06-17 LAB — IRON AND TIBC
Iron: 43 ug/dL (ref 28–170)
Saturation Ratios: 15 % (ref 10.4–31.8)
TIBC: 286 ug/dL (ref 250–450)
UIBC: 243 ug/dL

## 2021-06-17 MED ORDER — ENSURE ENLIVE PO LIQD
237.0000 mL | Freq: Three times a day (TID) | ORAL | Status: DC
  Administered 2021-06-17 – 2021-06-22 (×13): 237 mL via ORAL
  Administered 2021-06-23: 1 via ORAL
  Administered 2021-06-23 – 2021-07-10 (×41): 237 mL via ORAL

## 2021-06-17 MED ORDER — ADULT MULTIVITAMIN W/MINERALS CH
1.0000 | ORAL_TABLET | Freq: Every day | ORAL | Status: DC
  Administered 2021-06-17 – 2021-07-10 (×24): 1 via ORAL
  Filled 2021-06-17 (×24): qty 1

## 2021-06-17 NOTE — Progress Notes (Signed)
Initial Nutrition Assessment  DOCUMENTATION CODES:   Underweight, Non-severe (moderate) malnutrition in context of chronic illness  INTERVENTION:   -Liberalize diet to regular; discussed with MD via secure chat -Ensure Enlive po TID, each supplement provides 350 kcal and 20 grams of protein  -MVI with minerals daily  NUTRITION DIAGNOSIS:   Moderate Malnutrition related to chronic illness (dementia, COPD) as evidenced by mild fat depletion, moderate fat depletion, mild muscle depletion, moderate muscle depletion.  GOAL:   Patient will meet greater than or equal to 90% of their needs  MONITOR:   PO intake, Supplement acceptance, Labs, Weight trends, Skin, I & O's  REASON FOR ASSESSMENT:   Malnutrition Screening Tool    ASSESSMENT:   PHENIX GREIN is a 80 y.o. female with medical history significant of hypertension, hyperlipidemia, COPD, PVD, CKD stage stage IV, COVID-19 infection 2021, dementia presents with complaints of pain all over for the last 3 days.  Admits that a rash broke out on the left side of her abdomen and goes around to her back and states that it is very painful.  She states that she has been more short of breath and has had a productive cough with " cloudy" sputum production.  Associated symptoms include wheezing, chest pain, left hip pain, 15 pound weight loss over the last month, and generalized malaise.  Notes that she has not had an appetite.  Denies having any significant fever, nausea, or vomiting.  Patient has not tried anything to treat symptoms at home prior to coming to the hospital.  She reports quitting smoking about 1 month ago.  Records note patient has been having difficulty with shortness of breath for more than an months now  Pt admitted with herpes zoster and dyspnea secondary to lt lung collapse.   Reviewed I/O's: +640 ml x 24 hours   UOP: 100 ml x 24 hours  Spoke with pt at bedside, who reports a decreased appetite over the past 2 days  PTA. She did not feel like eating and been consuming mostly applesauce and fluids. Generally and prior to acute illness, she consumes 2 meals per day (Breakfast: eggs and grits, Lunch: sandwich, Snacks: cookies 2 times per day). Pt reports she did not eat breakfast today, but has been drinking a lot of water. Noted meal completions documented at 0%.   Pt reports her UBW is around 110#, which she reports she last weighed 2 weeks ago. She endorses a 15# wt loss over the past week secondary to poor oral intake, however, no data available to verify this. Reviewed wt hx; pt has experienced a 8.9% wt loss over the past 6 months. While this is not significant for time frame, it is concerning given underweight status, malnutrition, and history of poor oral intake.   Pt reports feeling a lot weaker, but denies any falls.   Discussed with pt importance of good meal and supplement intake to promote healing. Pt amenable to Ensure supplements, stating she routinely drinks them twice a day PTA.   Case discussed MD, who gave RD permission to liberalize diet.   Medications reviewed and include megace.   Labs reviewed: K: 5.2.   NUTRITION - FOCUSED PHYSICAL EXAM:  Flowsheet Row Most Recent Value  Orbital Region Mild depletion  Upper Arm Region Moderate depletion  Thoracic and Lumbar Region Mild depletion  Buccal Region Mild depletion  Temple Region Mild depletion  Clavicle Bone Region Mild depletion  Clavicle and Acromion Bone Region Moderate depletion  Scapular Bone  Region Moderate depletion  Dorsal Hand Moderate depletion  Patellar Region Moderate depletion  Anterior Thigh Region Moderate depletion  Posterior Calf Region Moderate depletion  Edema (RD Assessment) None  Eyes Reviewed  Mouth Reviewed  Skin Reviewed  Nails Reviewed       Diet Order:   Diet Order             Diet Heart Room service appropriate? Yes; Fluid consistency: Thin  Diet effective now                    EDUCATION NEEDS:   Education needs have been addressed  Skin:  Skin Assessment: Skin Integrity Issues: Skin Integrity Issues:: Other (Comment) Other: rash and blisters on lower abdomen  Last BM:  06/16/2021  Height:   Ht Readings from Last 1 Encounters:  06/16/21 5\' 3"  (1.6 m)    Weight:   Wt Readings from Last 1 Encounters:  06/16/21 45 kg    Ideal Body Weight:  52.3 kg  BMI:  Body mass index is 17.57 kg/m.  Estimated Nutritional Needs:   Kcal:  1600-1800  Protein:  90-105 grams  Fluid:  > 1.6 L    Loistine Chance, RD, LDN, [place] Registered Dietitian II Certified Diabetes Care and Education Specialist Please refer to [facility] for RD and/or RD on-call/weekend/after hours pager

## 2021-06-17 NOTE — Care Management Obs Status (Signed)
[place] NOTIFICATION   Patient Details  Name: Alyssa [name] MRN: [id number] Date of Birth: [birth year]   Medicare Observation Status Notification Given:  Yes    Tom-Johnson, Renea Ee, RN 06/17/2021, 1:46 PM

## 2021-06-17 NOTE — Progress Notes (Addendum)
PROGRESS NOTE  Alyssa [name]  DOB: 10-14-1941  PCP: [name], DO RKY:[id number]  DOA: 06/15/2021  LOS: 0 days  Hospital Day: 3   Chief Complaint  Patient presents with   Generalized Body Aches    Brief narrative: Alyssa [name] is a 80 y.o. female with PMH significant for COPD on home oxygen, COVID-19 3/22, active smoker, HTN, HLD, PVD, CKD stage stage IV.  Patient was brought to the ED on 8/29 with multiple complaints including chest pain, worsening shortness of breath, rash in the left flank/abdomen that progressed to the back, productive cough and weight loss.  In the ED, patient was hypertensive with blood pressure elevated to 197/109. Labs with hemoglobin 10.2, serum bicarb 16, BUN/creatinine 32/3.17, troponin 31 Chest x-ray revealed left lower lobe collapse. Influenza and COVID-19 screening were negative.   CT scan of the chest abdomen and pelvis showed left lower lobe collapse without visible mass or adenopathy and femoral bypass graft with increased fluid collection at bypass anastomosis measuring 4.8 x 4.1 cm previously 2.6 x 2.6 cm.   Patient was started on valacyclovir, IV fluid and admitted to hospitalist service. Pulmonary consultation was obtained. See below for details  Subjective: Patient was seen and examined this morning.  Pleasant elderly [name].  Lying down in bed.  Not in distress on 2 L oxygen by nasal cannula.  Assessment/Plan: Acute herpes zoster -Presented with painful rash on the left side of her chest/abdomen and the T8/T9 dermatome. -She has been started on valacyclovir 1 g oral daily. -Oxycodone for pain control.  She is also been started on gabapentin for neuropathic pain. -Airborne precautions.  ? Acute/chronic respiratory failure with hypoxia -Patient has chronic respiratory failure secondary to COPD, not sure if she uses oxygen at home.  Currently she is on 2 L oxygen by nasal cannula.   -She currently has left  lung collapse probably due to poor respiratory effort because of pain related to shingles.  Evidence of endobronchial obstruction -Continue bronchodilators. -Pulmonary consultation appreciated.   Hypertensive urgency -On admission blood pressures noted to be elevated up to 197/109.   -Home blood pressure regimen includes amlodipine 7.5 mg daily, Coreg 12.5 mg twice daily, and clonidine 0.2 mg nightly. -Continue home medication regimen. -Hydralazine IV as needed for elevated blood pressures  CKD stage IV Chronic metabolic acidosis -Creatinine at baseline. -Sodium bicarbonate tablets have been resumed. Recent Labs    07/10/20 0137 09/29/20 1406 09/29/20 2005 09/30/20 1256 10/01/20 1050 10/02/20 0402 10/03/20 0124 10/17/20 1217 10/17/20 1230 06/15/21 1740 06/17/21 0302  BUN 21 53* 51* 41* 40* 37* 36* 42* 41* 32* 39*  CREATININE 1.86* 2.81* 2.55* 2.30* 2.16* 2.44* 2.22* 2.61* 2.90* 3.17* 2.99*  CO2 25 13* 14* 17* 17* 20* 19* 16*  --  16* 12*    Chronic normocytic anemia -Hemoglobin stable. Recent Labs    07/08/20 1230 07/10/20 0137 10/02/20 1143 10/03/20 0124 10/17/20 1217 10/17/20 1230 06/15/21 1740 06/17/21 0302 06/17/21 0432  HGB  --    < >  --  8.3* 9.9* 10.2* 10.2*  --  9.7*  MCV  --    < >  --  80.3 87.2  --  86.4  --  82.3  FERRITIN 1,391*  --  790*  --   --   --   --  1,455*  --   TIBC 197*  --  300  --   --   --   --  286  --   IRON  32  --  96  --   --   --   --  43  --    < > = values in this interval not displayed.   Peripheral vascular disease fluid collection -CT imaging showed concern for increased in size of a fluid collection at bypass anastomosis measuring 4.8 x 4.1 cm.  Asymptomatic.  Can follow-up with vascular as an outpatient. -Continue Plavix, ASA, and statin  Mild dementia/anxiety/depression -Continue Aricept, Cymbalta  Moderate malnutrition -Nutrition consult appreciated.   History of tobacco abuse:  -Patient reports quitting smoking  approximately 1 month ago, but reported smoking for approximately 20 years.   Mobility: Encourage ambulation Code Status:   Code Status: Full Code  Nutritional status: Body mass index is 17.57 kg/m. Nutrition Problem: Moderate Malnutrition Etiology: chronic illness (dementia, COPD) Signs/Symptoms: mild fat depletion, moderate fat depletion, mild muscle depletion, moderate muscle depletion Diet: Cardiac diet Diet Order             Diet regular Room service appropriate? Yes with Assist; Fluid consistency: Thin  Diet effective now                  DVT prophylaxis:  heparin injection 5,000 Units Start: 06/16/21 0930   Antimicrobials: None Fluid: None Consultants: Pulmonary Family Communication: None at bedside  Status is: Observation  Remains inpatient appropriate because: Needs further inpatient monitoring, pain control, renal function monitoring  Dispo: The patient is from: Home              Anticipated d/c is to: Home in 2 to 3 days              Patient currently is not medically stable to d/c.   Difficult to place patient No     Infusions:    Scheduled Meds:  amLODipine  7.5 mg Oral QHS   arformoterol  15 mcg Nebulization BID   And   budesonide (PULMICORT) nebulizer solution  0.25 mg Nebulization BID   And   revefenacin  175 mcg Nebulization Daily   aspirin EC  81 mg Oral Daily   carvedilol  12.5 mg Oral BID   cloNIDine  0.2 mg Oral QHS   clopidogrel  75 mg Oral Daily   donepezil  5 mg Oral Q2200   DULoxetine  30 mg Oral Daily   feeding supplement  237 mL Oral TID BM   gabapentin  100 mg Oral BID WC   And   gabapentin  200 mg Oral QHS   heparin  5,000 Units Subcutaneous Q8H   megestrol  40 mg Oral BID   multivitamin with minerals  1 tablet Oral Daily   rosuvastatin  40 mg Oral Daily   sodium bicarbonate  650 mg Oral Daily   sodium chloride flush  3 mL Intravenous Q12H   valACYclovir  1,000 mg Oral Daily    Antimicrobials: Anti-infectives (From  admission, onward)    Start     Dose/Rate Route Frequency Ordered Stop   06/17/21 0800  valACYclovir (VALTREX) tablet 1,000 mg        1,000 mg Oral Daily 06/16/21 0839 06/23/21 0959   06/16/21 0515  valACYclovir (VALTREX) tablet 1,000 mg        1,000 mg Oral STAT 06/16/21 0502 06/16/21 0543       PRN meds: acetaminophen **OR** acetaminophen, albuterol, hydrALAZINE, ondansetron **OR** ondansetron (ZOFRAN) IV, oxyCODONE-acetaminophen   Objective: Vitals:   06/17/21 0756 06/17/21 0837  BP: (!) 152/85   Pulse: 82  Resp: 18 18  Temp: 98.1 F (36.7 C)   SpO2: 98%     Intake/Output Summary (Last 24 hours) at 06/17/2021 1346 Last data filed at 06/17/2021 0545 Gross per 24 hour  Intake 240 ml  Output 100 ml  Net 140 ml   Filed Weights   06/16/21 2229  Weight: 45 kg   Weight change:  Body mass index is 17.57 kg/m.   Physical Exam: General exam: Pleasant, elderly [name].  In mild distress because of neuropathic pain Skin: No rashes, lesions or ulcers. HEENT: Atraumatic, normocephalic, no obvious bleeding Lungs: Diminished air entry on left lung base Chest wall: Left side with skin lesions at various stages of development on the anterior and lateral wall CVS: Regular rate and rhythm, no murmur GI/Abd soft, nontender, nondistended, bowel sound present CNS: Alert, awake, oriented x3 Psychiatry: Mood appropriate Extremities: No pedal edema, no calf tenderness.  Data Review: I have personally reviewed the laboratory data and studies available.  Recent Labs  Lab 06/15/21 1740 06/17/21 0432  WBC 7.0 9.3  NEUTROABS 4.1  --   HGB 10.2* 9.7*  HCT 31.7* 28.9*  MCV 86.4 82.3  PLT 331 321   Recent Labs  Lab 06/15/21 1740 06/17/21 0302  NA 136 135  K 4.8 5.2*  CL 108 110  CO2 16* 12*  GLUCOSE 98 128*  BUN 32* 39*  CREATININE 3.17* 2.99*  CALCIUM 9.6 9.8    F/u labs ordered Unresulted Labs (From admission, onward)     Start     Ordered    06/18/21 0500  CBC with Differential/Platelet  Daily,   R     Question:  Specimen collection method  Answer:  Lab=Lab collect   06/17/21 1346   06/18/21 0354  Basic metabolic panel  Daily,   R     Question:  Specimen collection method  Answer:  Lab=Lab collect   06/17/21 1346            Signed, Terrilee Croak, MD [facility] 06/17/2021

## 2021-06-18 DIAGNOSIS — R0602 Shortness of breath: Secondary | ICD-10-CM | POA: Diagnosis not present

## 2021-06-18 LAB — CBC WITH DIFFERENTIAL/PLATELET
Abs Immature Granulocytes: 0.11 10*3/uL — ABNORMAL HIGH (ref 0.00–0.07)
Basophils Absolute: 0.1 10*3/uL (ref 0.0–0.1)
Basophils Relative: 1 %
Eosinophils Absolute: 0.1 10*3/uL (ref 0.0–0.5)
Eosinophils Relative: 1 %
HCT: 31.4 % — ABNORMAL LOW (ref 36.0–46.0)
Hemoglobin: 10.3 g/dL — ABNORMAL LOW (ref 12.0–15.0)
Immature Granulocytes: 1 %
Lymphocytes Relative: 15 %
Lymphs Abs: 1.6 10*3/uL (ref 0.7–4.0)
MCH: 27.8 pg (ref 26.0–34.0)
MCHC: 32.8 g/dL (ref 30.0–36.0)
MCV: 84.9 fL (ref 80.0–100.0)
Monocytes Absolute: 1.1 10*3/uL — ABNORMAL HIGH (ref 0.1–1.0)
Monocytes Relative: 10 %
Neutro Abs: 8 10*3/uL — ABNORMAL HIGH (ref 1.7–7.7)
Neutrophils Relative %: 72 %
Platelets: 323 10*3/uL (ref 150–400)
RBC: 3.7 MIL/uL — ABNORMAL LOW (ref 3.87–5.11)
RDW: 16.8 % — ABNORMAL HIGH (ref 11.5–15.5)
WBC: 10.9 10*3/uL — ABNORMAL HIGH (ref 4.0–10.5)
nRBC: 0 % (ref 0.0–0.2)

## 2021-06-18 LAB — BASIC METABOLIC PANEL
Anion gap: 10 (ref 5–15)
BUN: 44 mg/dL — ABNORMAL HIGH (ref 8–23)
CO2: 17 mmol/L — ABNORMAL LOW (ref 22–32)
Calcium: 9.5 mg/dL (ref 8.9–10.3)
Chloride: 109 mmol/L (ref 98–111)
Creatinine, Ser: 3.31 mg/dL — ABNORMAL HIGH (ref 0.44–1.00)
GFR, Estimated: 14 mL/min — ABNORMAL LOW (ref >60)
Glucose, Bld: 111 mg/dL — ABNORMAL HIGH (ref 70–99)
Potassium: 5.4 mmol/L — ABNORMAL HIGH (ref 3.5–5.1)
Sodium: 136 mmol/L (ref 135–145)

## 2021-06-18 MED ORDER — SODIUM CHLORIDE 0.9 % IV SOLN: INTRAVENOUS | Status: DC

## 2021-06-18 MED ORDER — CAPSAICIN 0.075 % EX CREA
TOPICAL_CREAM | Freq: Two times a day (BID) | CUTANEOUS | Status: DC
  Filled 2021-06-18: qty 60

## 2021-06-18 MED ORDER — PANTOPRAZOLE SODIUM 40 MG PO TBEC
40.0000 mg | DELAYED_RELEASE_TABLET | Freq: Every day | ORAL | Status: DC
  Administered 2021-06-18 – 2021-07-10 (×23): 40 mg via ORAL
  Filled 2021-06-18 (×23): qty 1

## 2021-06-18 MED ORDER — PREDNISONE 20 MG PO TABS
20.0000 mg | ORAL_TABLET | Freq: Every day | ORAL | Status: DC
  Administered 2021-06-18 – 2021-06-26 (×9): 20 mg via ORAL
  Filled 2021-06-18 (×9): qty 1

## 2021-06-18 MED ORDER — PREGABALIN 50 MG PO CAPS
50.0000 mg | ORAL_CAPSULE | Freq: Every day | ORAL | Status: DC
  Administered 2021-06-18 – 2021-06-20 (×3): 50 mg via ORAL
  Filled 2021-06-18 (×3): qty 1

## 2021-06-18 NOTE — Progress Notes (Signed)
PROGRESS NOTE  Alyssa [name]  DOB: August 18, 1941  PCP: [name], DO OFB:[id number]  DOA: 06/15/2021  LOS: 0 days  Hospital Day: 4   Chief Complaint  Patient presents with   Generalized Body Aches    Brief narrative: Alyssa [name] is a 80 y.o. female with PMH significant for COPD, COVID-19 3/22, active smoker, HTN, HLD, PVD, CKD stage stage IV.  Patient was brought to the ED on 8/29 with multiple complaints including chest pain, worsening shortness of breath, rash in the left flank/abdomen that progressed to the back, productive cough and weight loss.  In the ED, patient was hypertensive with blood pressure elevated to 197/109. Labs with hemoglobin 10.2, serum bicarb 16, BUN/creatinine 32/3.17, troponin 31 Chest x-ray revealed left lower lobe collapse. Influenza and COVID-19 screening were negative.   CT scan of the chest abdomen and pelvis showed left lower lobe collapse without visible mass or adenopathy and femoral bypass graft with increased fluid collection at bypass anastomosis measuring 4.8 x 4.1 cm previously 2.6 x 2.6 cm.   Patient was started on valacyclovir, IV fluid and admitted to hospitalist service. Pulmonary consultation was obtained. See below for details  Subjective: Patient was seen and examined this morning. Lying on bed.  Not in distress at the time of my evaluation.  Earlier because he was in a lot of pain and I suggested RN to give an extra dose of oxycodone. Remains on 2 L oxygen by nasal cannula  Assessment/Plan: Acute herpes zoster -Presented with painful rash on the left side of her chest/abdomen and the T8/T9 dermatome. -She has been started on valacyclovir 1 g oral daily. -Oxycodone for pain control.  She is also on gabapentin.  Not much relief.  I switched her to Lyrica this morning.  Also added capsicin cream. -Continue airborne precautions.  Acute respiratory failure with hypoxia -Patient states he does not use oxygen at home.  Currently she is on 2 L oxygen by nasal cannula.   -She currently has left lung collapse probably due to poor respiratory effort because of pain related to shingles.  No evidence of endobronchial obstruction -Continue bronchodilators. -Pulmonary consultation appreciated. -Wean down oxygen as tolerated.  Hypertensive urgency -On admission blood pressure was noted to be elevated up to 197/109.   -Home blood pressure regimen includes amlodipine 7.5 mg daily, Coreg 12.5 mg twice daily, and clonidine 0.2 mg nightly. -Continue home medication regimen. -Hydralazine IV as needed for elevated blood pressures  CKD stage IV Chronic metabolic acidosis -Creatinine at baseline less than 3.  Worsening in last 24 hours because of poor oral intake.  Started normal saline at 100 mL per hour. -Continue sodium bicarbonate. Recent Labs    09/29/20 1406 09/29/20 2005 09/30/20 1256 10/01/20 1050 10/02/20 0402 10/03/20 0124 10/17/20 1217 10/17/20 1230 06/15/21 1740 06/17/21 0302 06/18/21 0625  BUN 53* 51* 41* 40* 37* 36* 42* 41* 32* 39* 44*  CREATININE 2.81* 2.55* 2.30* 2.16* 2.44* 2.22* 2.61* 2.90* 3.17* 2.99* 3.31*  CO2 13* 14* 17* 17* 20* 19* 16*  --  16* 12* 17*    Chronic normocytic anemia -Hemoglobin stable. Recent Labs    07/08/20 1230 07/10/20 0137 10/02/20 1143 10/03/20 0124 10/17/20 1217 10/17/20 1230 06/15/21 1740 06/17/21 0302 06/17/21 0432 06/18/21 0625  HGB  --    < >  --    < > 9.9* 10.2* 10.2*  --  9.7* 10.3*  MCV  --    < >  --    < >  87.2  --  86.4  --  82.3 84.9  FERRITIN 1,391*  --  790*  --   --   --   --  1,455*  --   --   TIBC 197*  --  300  --   --   --   --  286  --   --   IRON 32  --  96  --   --   --   --  43  --   --    < > = values in this interval not displayed.   Peripheral vascular disease fluid collection -CT imaging showed concern for increased in size of a fluid collection at bypass anastomosis measuring 4.8 x 4.1 cm.  Asymptomatic.  Can follow-up  with vascular as an outpatient. -Continue Plavix, ASA, and statin  Mild dementia/anxiety/depression -Continue Aricept, Cymbalta  Moderate malnutrition -Nutrition consult appreciated.   History of tobacco abuse:  -Patient reports quitting smoking approximately 1 month ago, but reported smoking for approximately 20 years.   Mobility: Encourage ambulation Code Status:   Code Status: Full Code  Nutritional status: Body mass index is 17.57 kg/m. Nutrition Problem: Moderate Malnutrition Etiology: chronic illness (dementia, COPD) Signs/Symptoms: mild fat depletion, moderate fat depletion, mild muscle depletion, moderate muscle depletion Diet: Cardiac diet Diet Order             Diet regular Room service appropriate? Yes with Assist; Fluid consistency: Thin  Diet effective now                  DVT prophylaxis:  heparin injection 5,000 Units Start: 06/16/21 0930   Antimicrobials: None Fluid: None Consultants: Pulmonary Family Communication: None at bedside  Status is: Observation  Remains inpatient appropriate because: Needs further inpatient monitoring, pain control, renal function monitoring  Dispo: The patient is from: Home              Anticipated d/c is to: Home in 2 to 3 days              Patient currently is not medically stable to d/c.   Difficult to place patient No     Infusions:   sodium chloride 75 mL/hr at 06/18/21 1009    Scheduled Meds:  amLODipine  7.5 mg Oral QHS   arformoterol  15 mcg Nebulization BID   And   budesonide (PULMICORT) nebulizer solution  0.25 mg Nebulization BID   And   revefenacin  175 mcg Nebulization Daily   aspirin EC  81 mg Oral Daily   capsicum   Topical BID   carvedilol  12.5 mg Oral BID   cloNIDine  0.2 mg Oral QHS   clopidogrel  75 mg Oral Daily   donepezil  5 mg Oral Q2200   DULoxetine  30 mg Oral Daily   feeding supplement  237 mL Oral TID BM   heparin  5,000 Units Subcutaneous Q8H   megestrol  40 mg Oral BID    multivitamin with minerals  1 tablet Oral Daily   pantoprazole  40 mg Oral Daily   predniSONE  20 mg Oral Q breakfast   pregabalin  50 mg Oral Daily   rosuvastatin  40 mg Oral Daily   sodium bicarbonate  650 mg Oral Daily   sodium chloride flush  3 mL Intravenous Q12H   valACYclovir  1,000 mg Oral Daily    Antimicrobials: Anti-infectives (From admission, onward)    Start     Dose/Rate Route Frequency Ordered  Stop   06/17/21 0800  valACYclovir (VALTREX) tablet 1,000 mg        1,000 mg Oral Daily 06/16/21 0839 06/23/21 0959   06/16/21 0515  valACYclovir (VALTREX) tablet 1,000 mg        1,000 mg Oral STAT 06/16/21 0502 06/16/21 0543       PRN meds: acetaminophen **OR** acetaminophen, albuterol, hydrALAZINE, ondansetron **OR** ondansetron (ZOFRAN) IV, oxyCODONE-acetaminophen   Objective: Vitals:   06/18/21 0921 06/18/21 0923  BP: 134/77   Pulse: 73   Resp: 15   Temp: 97.6 F (36.4 C)   SpO2: 100% 100%    Intake/Output Summary (Last 24 hours) at 06/18/2021 1322 Last data filed at 06/18/2021 1013 Gross per 24 hour  Intake 403 ml  Output 400 ml  Net 3 ml   Filed Weights   06/16/21 2229  Weight: 45 kg   Weight change:  Body mass index is 17.57 kg/m.   Physical Exam: General exam: Pleasant, elderly [name].  Not in distress at the time of my eval Skin: No rashes, lesions or ulcers. HEENT: Atraumatic, normocephalic, no obvious bleeding Lungs: Diminished air entry on left lung base Chest wall: Left side with skin lesions at various stages of development on the anterior and lateral wall CVS: Regular rate and rhythm, no murmur GI/Abd soft, nontender, nondistended, bowel sound present CNS: Alert, awake, oriented x3 Psychiatry: Mood appropriate Extremities: No pedal edema, no calf tenderness.  Data Review: I have personally reviewed the laboratory data and studies available.  Recent Labs  Lab 06/15/21 1740 06/17/21 0432 06/18/21 0625  WBC 7.0 9.3  10.9*  NEUTROABS 4.1  --  8.0*  HGB 10.2* 9.7* 10.3*  HCT 31.7* 28.9* 31.4*  MCV 86.4 82.3 84.9  PLT 331 321 323   Recent Labs  Lab 06/15/21 1740 06/17/21 0302 06/18/21 0625  NA 136 135 136  K 4.8 5.2* 5.4*  CL 108 110 109  CO2 16* 12* 17*  GLUCOSE 98 128* 111*  BUN 32* 39* 44*  CREATININE 3.17* 2.99* 3.31*  CALCIUM 9.6 9.8 9.5    F/u labs ordered Unresulted Labs (From admission, onward)     Start     Ordered   06/18/21 0500  CBC with Differential/Platelet  Daily,   R     Question:  Specimen collection method  Answer:  Lab=Lab collect   06/17/21 1346   06/18/21 1941  Basic metabolic panel  Daily,   R     Question:  Specimen collection method  Answer:  Lab=Lab collect   06/17/21 1346            Signed, Terrilee Croak, MD [facility] 06/18/2021

## 2021-06-18 NOTE — Plan of Care (Signed)
  Problem: Pain Managment: Goal: General experience of comfort will improve Outcome: Progressing   

## 2021-06-19 DIAGNOSIS — D649 Anemia, unspecified: Secondary | ICD-10-CM | POA: Diagnosis present

## 2021-06-19 DIAGNOSIS — J9819 Other pulmonary collapse: Secondary | ICD-10-CM | POA: Diagnosis present

## 2021-06-19 DIAGNOSIS — N179 Acute kidney failure, unspecified: Secondary | ICD-10-CM | POA: Diagnosis present

## 2021-06-19 DIAGNOSIS — Z7189 Other specified counseling: Secondary | ICD-10-CM | POA: Diagnosis not present

## 2021-06-19 DIAGNOSIS — I259 Chronic ischemic heart disease, unspecified: Secondary | ICD-10-CM | POA: Diagnosis not present

## 2021-06-19 DIAGNOSIS — I739 Peripheral vascular disease, unspecified: Secondary | ICD-10-CM | POA: Diagnosis present

## 2021-06-19 DIAGNOSIS — E785 Hyperlipidemia, unspecified: Secondary | ICD-10-CM | POA: Diagnosis present

## 2021-06-19 DIAGNOSIS — B028 Zoster with other complications: Secondary | ICD-10-CM | POA: Diagnosis present

## 2021-06-19 DIAGNOSIS — K921 Melena: Secondary | ICD-10-CM | POA: Diagnosis not present

## 2021-06-19 DIAGNOSIS — B029 Zoster without complications: Secondary | ICD-10-CM | POA: Diagnosis not present

## 2021-06-19 DIAGNOSIS — N184 Chronic kidney disease, stage 4 (severe): Secondary | ICD-10-CM | POA: Diagnosis present

## 2021-06-19 DIAGNOSIS — K6289 Other specified diseases of anus and rectum: Secondary | ICD-10-CM | POA: Diagnosis not present

## 2021-06-19 DIAGNOSIS — F32A Depression, unspecified: Secondary | ICD-10-CM | POA: Diagnosis present

## 2021-06-19 DIAGNOSIS — I129 Hypertensive chronic kidney disease with stage 1 through stage 4 chronic kidney disease, or unspecified chronic kidney disease: Secondary | ICD-10-CM | POA: Diagnosis present

## 2021-06-19 DIAGNOSIS — Z681 Body mass index (BMI) 19 or less, adult: Secondary | ICD-10-CM | POA: Diagnosis not present

## 2021-06-19 DIAGNOSIS — K5641 Fecal impaction: Secondary | ICD-10-CM | POA: Diagnosis present

## 2021-06-19 DIAGNOSIS — Z515 Encounter for palliative care: Secondary | ICD-10-CM | POA: Diagnosis not present

## 2021-06-19 DIAGNOSIS — F0390 Unspecified dementia without behavioral disturbance: Secondary | ICD-10-CM | POA: Diagnosis present

## 2021-06-19 DIAGNOSIS — R0602 Shortness of breath: Secondary | ICD-10-CM | POA: Diagnosis present

## 2021-06-19 DIAGNOSIS — Z66 Do not resuscitate: Secondary | ICD-10-CM | POA: Diagnosis present

## 2021-06-19 DIAGNOSIS — R634 Abnormal weight loss: Secondary | ICD-10-CM | POA: Diagnosis present

## 2021-06-19 DIAGNOSIS — R52 Pain, unspecified: Secondary | ICD-10-CM | POA: Diagnosis not present

## 2021-06-19 DIAGNOSIS — M792 Neuralgia and neuritis, unspecified: Secondary | ICD-10-CM | POA: Diagnosis not present

## 2021-06-19 DIAGNOSIS — B0229 Other postherpetic nervous system involvement: Secondary | ICD-10-CM | POA: Diagnosis present

## 2021-06-19 DIAGNOSIS — K5900 Constipation, unspecified: Secondary | ICD-10-CM | POA: Diagnosis not present

## 2021-06-19 DIAGNOSIS — E440 Moderate protein-calorie malnutrition: Secondary | ICD-10-CM | POA: Diagnosis present

## 2021-06-19 DIAGNOSIS — E872 Acidosis: Secondary | ICD-10-CM | POA: Diagnosis present

## 2021-06-19 DIAGNOSIS — J9811 Atelectasis: Secondary | ICD-10-CM | POA: Diagnosis not present

## 2021-06-19 DIAGNOSIS — D631 Anemia in chronic kidney disease: Secondary | ICD-10-CM | POA: Diagnosis present

## 2021-06-19 DIAGNOSIS — J432 Centrilobular emphysema: Secondary | ICD-10-CM | POA: Diagnosis present

## 2021-06-19 DIAGNOSIS — Z8616 Personal history of COVID-19: Secondary | ICD-10-CM | POA: Diagnosis not present

## 2021-06-19 DIAGNOSIS — K625 Hemorrhage of anus and rectum: Secondary | ICD-10-CM | POA: Diagnosis not present

## 2021-06-19 DIAGNOSIS — J9621 Acute and chronic respiratory failure with hypoxia: Secondary | ICD-10-CM | POA: Diagnosis present

## 2021-06-19 DIAGNOSIS — Z20822 Contact with and (suspected) exposure to covid-19: Secondary | ICD-10-CM | POA: Diagnosis present

## 2021-06-19 LAB — CBC WITH DIFFERENTIAL/PLATELET
Abs Immature Granulocytes: 0 10*3/uL (ref 0.00–0.07)
Basophils Absolute: 0.1 10*3/uL (ref 0.0–0.1)
Basophils Relative: 1 %
Eosinophils Absolute: 0 10*3/uL (ref 0.0–0.5)
Eosinophils Relative: 0 %
HCT: 24.3 % — ABNORMAL LOW (ref 36.0–46.0)
Hemoglobin: 8.2 g/dL — ABNORMAL LOW (ref 12.0–15.0)
Lymphocytes Relative: 10 %
Lymphs Abs: 1.2 10*3/uL (ref 0.7–4.0)
MCH: 28.3 pg (ref 26.0–34.0)
MCHC: 33.7 g/dL (ref 30.0–36.0)
MCV: 83.8 fL (ref 80.0–100.0)
Monocytes Absolute: 0.4 10*3/uL (ref 0.1–1.0)
Monocytes Relative: 3 %
Neutro Abs: 10.2 10*3/uL — ABNORMAL HIGH (ref 1.7–7.7)
Neutrophils Relative %: 86 %
Platelets: 308 10*3/uL (ref 150–400)
RBC: 2.9 MIL/uL — ABNORMAL LOW (ref 3.87–5.11)
RDW: 16.9 % — ABNORMAL HIGH (ref 11.5–15.5)
WBC: 11.9 10*3/uL — ABNORMAL HIGH (ref 4.0–10.5)
nRBC: 0 % (ref 0.0–0.2)
nRBC: 0 /100 WBC

## 2021-06-19 LAB — BASIC METABOLIC PANEL
Anion gap: 8 (ref 5–15)
BUN: 49 mg/dL — ABNORMAL HIGH (ref 8–23)
CO2: 15 mmol/L — ABNORMAL LOW (ref 22–32)
Calcium: 8.7 mg/dL — ABNORMAL LOW (ref 8.9–10.3)
Chloride: 110 mmol/L (ref 98–111)
Creatinine, Ser: 3.34 mg/dL — ABNORMAL HIGH (ref 0.44–1.00)
GFR, Estimated: 14 mL/min — ABNORMAL LOW (ref >60)
Glucose, Bld: 115 mg/dL — ABNORMAL HIGH (ref 70–99)
Potassium: 5.6 mmol/L — ABNORMAL HIGH (ref 3.5–5.1)
Sodium: 133 mmol/L — ABNORMAL LOW (ref 135–145)

## 2021-06-19 MED ORDER — SODIUM ZIRCONIUM CYCLOSILICATE 5 G PO PACK
5.0000 g | PACK | Freq: Every day | ORAL | Status: DC
  Administered 2021-06-19 – 2021-06-21 (×3): 5 g via ORAL
  Filled 2021-06-19 (×3): qty 1

## 2021-06-19 NOTE — Plan of Care (Signed)
  Problem: Activity: Goal: Risk for activity intolerance will decrease Outcome: Not Progressing   Problem: Nutrition: Goal: Adequate nutrition will be maintained Outcome: Not Progressing   

## 2021-06-19 NOTE — Progress Notes (Signed)
PROGRESS NOTE  Alyssa [name]  DOB: 1941/08/18  PCP: [name], DO PQZ:[id number]  DOA: 06/15/2021  LOS: 0 days  Hospital Day: 5   Chief Complaint  Patient presents with   Generalized Body Aches    Brief narrative: Alyssa [name] is a 80 y.o. female with PMH significant for COPD, COVID-19 3/22, active smoker, HTN, HLD, PVD, CKD stage stage IV.  Patient was brought to the ED on 8/29 with multiple complaints including chest pain, worsening shortness of breath, rash in the left flank/abdomen that progressed to the back, productive cough and weight loss.  In the ED, patient was hypertensive with blood pressure elevated to 197/109. Labs with hemoglobin 10.2, serum bicarb 16, BUN/creatinine 32/3.17, troponin 31 Chest x-ray revealed left lower lobe collapse. Influenza and COVID-19 screening were negative.   CT scan of the chest abdomen and pelvis showed left lower lobe collapse without visible mass or adenopathy and femoral bypass graft with increased fluid collection at bypass anastomosis measuring 4.8 x 4.1 cm previously 2.6 x 2.6 cm.   Patient was started on valacyclovir, IV fluid and admitted to hospitalist service. Pulmonary consultation was obtained. See below for details  Subjective: Patient was seen and examined this morning. Lying on bed.  She states that her pain is better controlled today.  On 2 L oxygen by nasal cannula.  Assessment/Plan: Acute herpes zoster -Presented with painful rash on the left side of her chest/abdomen and the T8/T9 dermatome. -She has been started on valacyclovir 1 g oral daily. -For pain control, she is on oxycodone, Lyrica, capsicin cream -Continue airborne precautions.  Acute respiratory failure with hypoxia -Supplemental oxygen at home.  Currently quiring 2 L by nasal cannula.  Chest x-ray on admission showe left lung collapse probably due to poor respiratory effort because of pain related to shingles.  No evidence of endobronchial  obstruction. -Continue bronchodilators. -Pulmonary consultation appreciated. -Wean down oxygen as tolerated.  Hypertensive urgency -On admission blood pressure was noted to be elevated up to 197/109.   -Home blood pressure regimen includes amlodipine 7.5 mg daily, Coreg 12.5 mg twice daily, and clonidine 0.2 mg nightly. -Currently blood pressure is controlled on home regimen. -Hydralazine IV as needed for elevated blood pressures  AKI on CKD stage IV Chronic metabolic acidosis -Creatinine at baseline less than 3.  Worsening.  Probably because of poor oral intake.  Continue normal saline. -Also continue sodium bicarbonate tablets. Recent Labs    09/29/20 2005 09/30/20 1256 10/01/20 1050 10/02/20 0402 10/03/20 0124 10/17/20 1217 10/17/20 1230 06/15/21 1740 06/17/21 0302 06/18/21 0625 06/19/21 0539  BUN 51* 41* 40* 37* 36* 42* 41* 32* 39* 44* 49*  CREATININE 2.55* 2.30* 2.16* 2.44* 2.22* 2.61* 2.90* 3.17* 2.99* 3.31* 3.34*  CO2 14* 17* 17* 20* 19* 16*  --  16* 12* 17* 15*     Chronic normocytic anemia -Hemoglobin trending down, probably secondary to dilution.  Continue to monitor. Recent Labs    07/08/20 1230 07/10/20 0137 10/02/20 1143 10/03/20 0124 10/17/20 1230 06/15/21 1740 06/17/21 0302 06/17/21 0432 06/18/21 0625 06/19/21 0539  HGB  --    < >  --    < > 10.2* 10.2*  --  9.7* 10.3* 8.2*  MCV  --    < >  --    < >  --  86.4  --  82.3 84.9 83.8  FERRITIN 1,391*  --  790*  --   --   --  1,455*  --   --   --  TIBC 197*  --  300  --   --   --  286  --   --   --   IRON 32  --  96  --   --   --  43  --   --   --    < > = values in this interval not displayed.    Peripheral vascular disease fluid collection -CT imaging showed concern for increased in size of a fluid collection at bypass anastomosis measuring 4.8 x 4.1 cm.  Asymptomatic.  Can follow-up with vascular as an outpatient. -Continue Plavix, ASA, and statin  Mild dementia/anxiety/depression -Continue  Aricept, Cymbalta  Moderate malnutrition -Nutrition consult appreciated.   History of tobacco abuse:  -Patient reports quitting smoking approximately 1 month ago, but reported smoking for approximately 20 years.   Mobility: Encourage ambulation Code Status:   Code Status: Full Code  Nutritional status: Body mass index is 19.49 kg/m. Nutrition Problem: Moderate Malnutrition Etiology: chronic illness (dementia, COPD) Signs/Symptoms: mild fat depletion, moderate fat depletion, mild muscle depletion, moderate muscle depletion Diet: Cardiac diet Diet Order             Diet regular Room service appropriate? Yes with Assist; Fluid consistency: Thin  Diet effective now                  DVT prophylaxis:  heparin injection 5,000 Units Start: 06/16/21 0930   Antimicrobials: None Fluid: None Consultants: Pulmonary Family Communication: None at bedside  Status is: Inpatient  Remains inpatient appropriate because: Needs further inpatient monitoring, pain control, renal function monitoring  Dispo: The patient is from: Home              Anticipated d/c is to: SNF.  PT eval obtained.              Patient currently is not medically stable to d/c.   Difficult to place patient No     Infusions:   sodium chloride 75 mL/hr at 06/19/21 6301    Scheduled Meds:  amLODipine  7.5 mg Oral QHS   arformoterol  15 mcg Nebulization BID   And   budesonide (PULMICORT) nebulizer solution  0.25 mg Nebulization BID   And   revefenacin  175 mcg Nebulization Daily   aspirin EC  81 mg Oral Daily   capsicum   Topical BID   carvedilol  12.5 mg Oral BID   cloNIDine  0.2 mg Oral QHS   clopidogrel  75 mg Oral Daily   donepezil  5 mg Oral Q2200   DULoxetine  30 mg Oral Daily   feeding supplement  237 mL Oral TID BM   heparin  5,000 Units Subcutaneous Q8H   megestrol  40 mg Oral BID   multivitamin with minerals  1 tablet Oral Daily   pantoprazole  40 mg Oral Daily   predniSONE  20 mg Oral Q  breakfast   pregabalin  50 mg Oral Daily   rosuvastatin  40 mg Oral Daily   sodium bicarbonate  650 mg Oral Daily   sodium chloride flush  3 mL Intravenous Q12H   sodium zirconium cyclosilicate  5 g Oral Daily   valACYclovir  1,000 mg Oral Daily    Antimicrobials: Anti-infectives (From admission, onward)    Start     Dose/Rate Route Frequency Ordered Stop   06/17/21 0800  valACYclovir (VALTREX) tablet 1,000 mg        1,000 mg Oral Daily 06/16/21 0839 06/23/21 0959  06/16/21 0515  valACYclovir (VALTREX) tablet 1,000 mg        1,000 mg Oral STAT 06/16/21 0502 06/16/21 0543       PRN meds: acetaminophen **OR** acetaminophen, albuterol, hydrALAZINE, ondansetron **OR** ondansetron (ZOFRAN) IV, oxyCODONE-acetaminophen   Objective: Vitals:   06/19/21 0830 06/19/21 0938  BP: (!) 144/83   Pulse: 83   Resp: 18   Temp: 98.3 F (36.8 C)   SpO2: 97% 98%    Intake/Output Summary (Last 24 hours) at 06/19/2021 1553 Last data filed at 06/19/2021 1403 Gross per 24 hour  Intake 1178.16 ml  Output 100 ml  Net 1078.16 ml    Filed Weights   06/16/21 2229 06/19/21 0519  Weight: 45 kg 49.9 kg   Weight change:  Body mass index is 19.49 kg/m.   Physical Exam: General exam: Pleasant, elderly [name].  Less pain today. Skin: No rashes, lesions or ulcers. HEENT: Atraumatic, normocephalic, no obvious bleeding Lungs: Diminished air entry on left lung base Chest wall: Left side with skin denudation, painful even with light touch by her clothes. CVS: Regular rate and rhythm, no murmur GI/Abd soft, nontender, nondistended, bowel sound present CNS: Alert, awake, oriented x3 Psychiatry: Mood appropriate Extremities: No pedal edema, no calf tenderness.  Data Review: I have personally reviewed the laboratory data and studies available.  Recent Labs  Lab 06/15/21 1740 06/17/21 0432 06/18/21 0625 06/19/21 0539  WBC 7.0 9.3 10.9* 11.9*  NEUTROABS 4.1  --  8.0* 10.2*  HGB  10.2* 9.7* 10.3* 8.2*  HCT 31.7* 28.9* 31.4* 24.3*  MCV 86.4 82.3 84.9 83.8  PLT 331 321 323 308    Recent Labs  Lab 06/15/21 1740 06/17/21 0302 06/18/21 0625 06/19/21 0539  NA 136 135 136 133*  K 4.8 5.2* 5.4* 5.6*  CL 108 110 109 110  CO2 16* 12* 17* 15*  GLUCOSE 98 128* 111* 115*  BUN 32* 39* 44* 49*  CREATININE 3.17* 2.99* 3.31* 3.34*  CALCIUM 9.6 9.8 9.5 8.7*     F/u labs ordered Unresulted Labs (From admission, onward)     Start     Ordered   06/18/21 0500  CBC with Differential/Platelet  Daily,   R     Question:  Specimen collection method  Answer:  Lab=Lab collect   06/17/21 1346   06/18/21 0277  Basic metabolic panel  Daily,   R     Question:  Specimen collection method  Answer:  Lab=Lab collect   06/17/21 1346            Signed, Terrilee Croak, MD [facility] 06/19/2021

## 2021-06-19 NOTE — NC FL2 (Signed)
[place] LEVEL OF CARE SCREENING TOOL     IDENTIFICATION  [name] Birthdate: 23-May-1941 Sex: female Admission Date (Current Location): 06/15/2021  [place] and [place] Number:  Alyssa [name] [id number] [place] and Address:  The [place], [place] [address], [place], [place] [address]      Provider Number: [id number]  Attending Physician Name and Address:  Terrilee Croak, MD  Relative Name and Phone Number:  Adaja Wander - son; [phone]    Current Level of Care: Hospital Recommended Level of Care: [place] Prior Approval Number:    Date Approved/Denied:   PASRR Number: [id number]  Discharge Plan: SNF    Current Diagnoses: Patient Active Problem List   Diagnosis Date Noted   Malnutrition of moderate degree 06/17/2021   Weight loss 06/16/2021   Zoster 06/16/2021   Collapsed lung    COPD  GOLD 0/ still smoking  09/26/2020   Cigarette smoker 09/26/2020   Shortness of breath 07/06/2020   Essential hypertension 07/06/2020   Abdominal pain 07/06/2020   Elevated INR 07/06/2020   Acute respiratory failure due to COVID-19 ([place]) 06/02/2020   Acute renal failure superimposed on chronic kidney disease ([place]) 02/06/2020   Leukocytosis 02/06/2020   Thrombocytosis 02/06/2020   Palliative care encounter    Constipation 01/05/2020   Protein-calorie malnutrition, severe ([place]) 01/04/2020   Acute on chronic renal failure ([place]) 01/04/2020   Hyperkalemia 01/03/2020   AKI (acute kidney injury) ([place]) 01/03/2020   Acute respiratory failure with hypoxia ([place]) 11/01/2019   UTI (urinary tract infection) 11/01/2019   CAP (community acquired pneumonia) 11/01/2019   Acute cystitis without hematuria    Encephalopathy 09/18/2019   SAH (subarachnoid hemorrhage) ([place]) 09/18/2019   Dementia ([place]) 57/32/2025   Metabolic acidosis 42/70/6237   ARF (acute renal failure) ([place]) 09/17/2019   Acute encephalopathy  09/17/2019   Abnormal CT scan, colon    Diarrhea    Diverticulitis 06/07/2019   Depression with anxiety 06/07/2019   Volume overload 03/23/2019   Edema 03/23/2019   Acute renal failure (ARF) ([place]) 03/08/2019   Hypokalemia 03/08/2019   Palliative care by specialist    Goals of care, counseling/discussion    Altered mental status    Encephalopathy, toxic 12/24/2018   Aspiration pneumonia of both lower lobes due to gastric secretions ([place]) 12/24/2018   Non-ST elevation (NSTEMI) myocardial infarction [facility])    Chest pain    Dyspnea on exertion    Elevated troponin level 12/09/2018   Chronic migraine 03/27/2018   Lumbar radiculopathy 03/27/2018   Gait abnormality 03/27/2018   Hypertensive urgency 12/28/2016   CKD (chronic kidney disease) stage 4, GFR 15-29 ml/min (HCC) 12/28/2016   Anemia 12/28/2016   Chronic pain syndrome 12/28/2016   Benzodiazepine withdrawal without complication ([place]) 62/83/1517   Chronic right shoulder pain 12/06/2016   Fall 09/17/2016   Fracture of humeral head, closed, right, initial encounter 09/17/2016   Rib fractures 09/17/2016   Multiple falls 09/17/2016   Severe muscle deconditioning 09/17/2016   Failure to thrive in adult 09/17/2016   Melena 04/25/2016   Transient hypotension 04/25/2016   Anxiety state 04/25/2016   Tobacco abuse 04/25/2016   Hyperlipidemia 04/25/2016   Syncope 04/24/2016   Near syncope 04/24/2016   Gait difficulty 01/12/2016   Foot drop, left 01/12/2016   Severe recurrent major depression without psychotic features ([place]) 08/28/2015   Spondylolisthesis at L4-L5 level 07/30/2015   PVD (peripheral vascular disease) ([place]) 07/29/2014   Weakness-Bilateral arm/leg 07/29/2014  Numbness-left leg 07/29/2014   Swelling of limb-Legs 07/29/2014   Atherosclerosis of native arteries of the extremities with intermittent claudication 09/30/2011    Orientation RESPIRATION BLADDER Height & Weight     Self, Time, Situation, Place  O2 (2L  oxygen) Incontinent, External catheter Weight: 110 lb 0.2 oz (49.9 kg) Height:  5\' 3"  (160 cm)  BEHAVIORAL SYMPTOMS/MOOD NEUROLOGICAL BOWEL NUTRITION STATUS      Continent Diet (Regular)  AMBULATORY STATUS COMMUNICATION OF NEEDS Skin   Total Care (Patient was unable to ambulate with PT during evaluation on 9/2 due to pain) Verbally Other (Comment) (Blister abdomen, flank hip left; Rash abdomen, flank hip left; Rash abdomen, flank hip left)                       Personal Care Assistance Level of Assistance  Bathing, Feeding, Dressing Bathing Assistance: Maximum assistance Feeding assistance: Limited assistance (Assistance with set-up) Dressing Assistance: Maximum assistance     Functional Limitations Info  Sight, Hearing, Speech Sight Info: Impaired Hearing Info: Adequate Speech Info: Adequate    SPECIAL CARE FACTORS FREQUENCY  PT (By licensed PT)     PT Frequency: Evaluation              Contractures Contractures Info: Not present    Additional Factors Info  Code Status, Allergies Code Status Info: Full Allergies Info: No known allergies           Current Medications (06/19/2021):  This is the current hospital active medication list Current Facility-Administered Medications  Medication Dose Route Frequency Provider Last Rate Last Admin   0.9 %  sodium chloride infusion   Intravenous Continuous Dahal, Binaya, MD 75 mL/hr at 06/19/21 0650 New Bag at 06/19/21 0650   acetaminophen (TYLENOL) tablet 650 mg  650 mg Oral Q6H PRN Norval Morton, MD   650 mg at 06/17/21 1746   Or   acetaminophen (TYLENOL) suppository 650 mg  650 mg Rectal Q6H PRN Fuller Plan A, MD       albuterol (PROVENTIL) (2.5 MG/3ML) 0.083% nebulizer solution 2.5 mg  2.5 mg Nebulization Q4H PRN Tamala Julian, Rondell A, MD       amLODipine (NORVASC) tablet 7.5 mg  7.5 mg Oral QHS Smith, Rondell A, MD   7.5 mg at 06/18/21 2219   arformoterol (BROVANA) nebulizer solution 15 mcg  15 mcg Nebulization BID  Fuller Plan A, MD   15 mcg at 06/19/21 2952   And   budesonide (PULMICORT) nebulizer solution 0.25 mg  0.25 mg Nebulization BID Fuller Plan A, MD   0.25 mg at 06/19/21 8413   And   revefenacin (YUPELRI) nebulizer solution 175 mcg  175 mcg Nebulization Daily Fuller Plan A, MD   175 mcg at 06/19/21 0940   aspirin EC tablet 81 mg  81 mg Oral Daily Smith, Rondell A, MD   81 mg at 06/19/21 0900   capsicum (ZOSTRIX) 0.075 % cream   Topical BID Terrilee Croak, MD   Given at 06/19/21 0903   carvedilol (COREG) tablet 12.5 mg  12.5 mg Oral BID Tamala Julian, Rondell A, MD   12.5 mg at 06/19/21 0900   cloNIDine (CATAPRES) tablet 0.2 mg  0.2 mg Oral QHS Smith, Rondell A, MD   0.2 mg at 06/18/21 2218   clopidogrel (PLAVIX) tablet 75 mg  75 mg Oral Daily Smith, Rondell A, MD   75 mg at 06/19/21 0859   donepezil (ARICEPT) tablet 5 mg  5 mg Oral Q2200  Fuller Plan A, MD   5 mg at 06/18/21 2219   DULoxetine (CYMBALTA) DR capsule 30 mg  30 mg Oral Daily Smith, Rondell A, MD   30 mg at 06/19/21 0900   feeding supplement (ENSURE ENLIVE / ENSURE PLUS) liquid 237 mL  237 mL Oral TID BM Dahal, Marlowe Aschoff, MD   237 mL at 06/18/21 2008   heparin injection 5,000 Units  5,000 Units Subcutaneous Q8H Smith, Rondell A, MD   5,000 Units at 06/19/21 0540   hydrALAZINE (APRESOLINE) injection 10 mg  10 mg Intravenous Q4H PRN Fuller Plan A, MD   10 mg at 06/16/21 1944   megestrol (MEGACE) tablet 40 mg  40 mg Oral BID Fuller Plan A, MD   40 mg at 06/19/21 0901   multivitamin with minerals tablet 1 tablet  1 tablet Oral Daily Dahal, Binaya, MD   1 tablet at 06/19/21 0900   ondansetron (ZOFRAN) tablet 4 mg  4 mg Oral Q6H PRN Fuller Plan A, MD       Or   ondansetron (ZOFRAN) injection 4 mg  4 mg Intravenous Q6H PRN Fuller Plan A, MD       oxyCODONE-acetaminophen (PERCOCET/ROXICET) 5-325 MG per tablet 1 tablet  1 tablet Oral Q4H PRN Fuller Plan A, MD   1 tablet at 06/19/21 0647   pantoprazole (PROTONIX) EC tablet 40 mg   40 mg Oral Daily Dahal, Binaya, MD   40 mg at 06/19/21 0900   predniSONE (DELTASONE) tablet 20 mg  20 mg Oral Q breakfast Dahal, Binaya, MD   20 mg at 06/19/21 0900   pregabalin (LYRICA) capsule 50 mg  50 mg Oral Daily Dahal, Binaya, MD   50 mg at 06/19/21 0900   rosuvastatin (CRESTOR) tablet 40 mg  40 mg Oral Daily Smith, Rondell A, MD   40 mg at 06/19/21 0859   sodium bicarbonate tablet 650 mg  650 mg Oral Daily Smith, Rondell A, MD   650 mg at 06/19/21 0859   sodium chloride flush (NS) 0.9 % injection 3 mL  3 mL Intravenous Q12H Smith, Rondell A, MD   3 mL at 06/18/21 2229   sodium zirconium cyclosilicate (LOKELMA) packet 5 g  5 g Oral Daily Dahal, Binaya, MD   5 g at 06/19/21 1232   valACYclovir (VALTREX) tablet 1,000 mg  1,000 mg Oral Daily Fuller Plan A, MD   1,000 mg at 06/19/21 0900     Discharge Medications: Please see discharge summary for a list of discharge medications.  Relevant Imaging Results:  Relevant Lab Results:   Additional Information ss#[id number]. COVID vaccinations: 2/23, 3/16, 10/12/20  Sable Feil, LCSW

## 2021-06-19 NOTE — Evaluation (Signed)
Physical Therapy Evaluation Patient Details Name: Alyssa [name] MRN: [id number] DOB: [birth year] Today's Date: 06/19/2021   History of Present Illness  Pt is a 80 y/o female admitted 8/29  secondary to worsening shortness of breath, rash in the left flank/abdomen that progressed to the back, productive cough and weight loss. Found to have shingles, hypoxic respiratory failure and hypertensive urgency. PMH inclues PVD, COPD, HTN, and covid.  Clinical Impression  Pt admitted secondary to problem above with deficits below. Pt very limited this session secondary to back pain. Requiring min to mod A for bed mobility and to stand at EOB using RW. Took one side step at EOB, but was unable to tolerate further secondary to pain. Pt reports she lives with her roommate, but her roommate works during the day. Prior to admission, pt was independent with use of RW. Given decline in function, recommending SNF level therapies at d/c. Will continue to follow acutely.     Follow Up Recommendations SNF    Equipment Recommendations  None recommended by PT    Recommendations for Other Services       Precautions / Restrictions Precautions Precautions: Fall Restrictions Weight Bearing Restrictions: No      Mobility  Bed Mobility Overal bed mobility: Needs Assistance Bed Mobility: Supine to Sit;Sit to Supine     Supine to sit: Min assist;HOB elevated Sit to supine: Mod assist   General bed mobility comments: Min A for trunk elevation to come to sitting. Increased pain reported. Mod A for LE assist to return to supine.    Transfers Overall transfer level: Needs assistance Equipment used: Rolling walker (2 wheeled) Transfers: Sit to/from Stand Sit to Stand: Min assist;Mod assist         General transfer comment: Min to mod A for lift assist and steadying. Was able to take one side step at EOB with min A, however, further mobility deferred.  Ambulation/Gait             General Gait  Details: Unable.  Stairs            Wheelchair Mobility    Modified Rankin (Stroke Patients Only)       Balance Overall balance assessment: Needs assistance Sitting-balance support: Bilateral upper extremity supported;Feet supported Sitting balance-Leahy Scale: Poor Sitting balance - Comments: Reliant on UE support to maintain sitting balance   Standing balance support: Bilateral upper extremity supported;During functional activity Standing balance-Leahy Scale: Poor Standing balance comment: Reliant on BUE support                             Pertinent Vitals/Pain Pain Assessment: Faces Faces Pain Scale: Hurts whole lot Pain Location: back Pain Descriptors / Indicators: Aching;Grimacing;Guarding Pain Intervention(s): Limited activity within patient's tolerance;Monitored during session;Repositioned    Home Living Family/patient expects to be discharged to:: Private residence Living Arrangements: Non-relatives/Friends (roommate) Available Help at Discharge: Friend(s);Available PRN/intermittently Type of Home: House Home Access: Level entry     Home Layout: One level Home Equipment: Walker - 2 wheels;Walker - 4 wheels;Tub bench      Prior Function Level of Independence: Independent with assistive device(s)         Comments: Uses RW for mobility and reports independent with ADLs.     Hand Dominance        Extremity/Trunk Assessment   Upper Extremity Assessment Upper Extremity Assessment: Defer to OT evaluation    Lower Extremity Assessment Lower Extremity Assessment: Generalized  weakness    Cervical / Trunk Assessment Cervical / Trunk Assessment: Kyphotic  Communication   Communication: No difficulties  Cognition Arousal/Alertness: Awake/alert Behavior During Therapy: WFL for tasks assessed/performed Overall Cognitive Status: No family/caregiver present to determine baseline cognitive functioning                                         General Comments General comments (skin integrity, edema, etc.): Open wounds noted on pt's back    Exercises     Assessment/Plan    PT Assessment Patient needs continued PT services  PT Problem List Decreased strength       PT Treatment Interventions DME instruction;Stair training;Gait training;Functional mobility training;Therapeutic activities;Therapeutic exercise;Balance training;Patient/family education    PT Goals (Current goals can be found in the Care Plan section)  Acute Rehab PT Goals Patient Stated Goal: to decrease pain PT Goal Formulation: With patient Time For Goal Achievement: 07/03/21 Potential to Achieve Goals: Fair    Frequency Min 2X/week   Barriers to discharge        Co-evaluation               AM-PAC PT "6 Clicks" Mobility  Outcome Measure Help needed turning from your back to your side while in a flat bed without using bedrails?: A Little Help needed moving from lying on your back to sitting on the side of a flat bed without using bedrails?: A Little Help needed moving to and from a bed to a chair (including a wheelchair)?: A Lot Help needed standing up from a chair using your arms (e.g., wheelchair or bedside chair)?: A Lot Help needed to walk in hospital room?: A Lot Help needed climbing 3-5 steps with a railing? : Total 6 Click Score: 13    End of Session Equipment Utilized During Treatment: Gait belt Activity Tolerance: Patient limited by pain Patient left: in bed;with call bell/phone within reach;with bed alarm set;with nursing/sitter in room Nurse Communication: Mobility status PT Visit Diagnosis: Unsteadiness on feet (R26.81);Muscle weakness (generalized) (M62.81);Difficulty in walking, not elsewhere classified (R26.2);Pain Pain - part of body:  (back)    Time: 6203-5597 PT Time Calculation (min) (ACUTE ONLY): 21 min   Charges:   PT Evaluation $PT Eval Moderate Complexity: 1 Mod          Reuel Derby,  PT, DPT  Acute Rehabilitation Services  Pager: [phone] Office: [phone]   Rudean Hitt 06/19/2021, 9:52 AM

## 2021-06-19 NOTE — Plan of Care (Signed)
  Problem: Clinical Measurements: Goal: Respiratory complications will improve Outcome: Progressing   Problem: Activity: Goal: Risk for activity intolerance will decrease Outcome: Progressing   Problem: Coping: Goal: Level of anxiety will decrease Outcome: Progressing   Problem: Pain Managment: Goal: General experience of comfort will improve Outcome: Progressing   Problem: Safety: Goal: Ability to remain free from injury will improve Outcome: Progressing   

## 2021-06-20 DIAGNOSIS — N184 Chronic kidney disease, stage 4 (severe): Secondary | ICD-10-CM | POA: Diagnosis not present

## 2021-06-20 DIAGNOSIS — Z7189 Other specified counseling: Secondary | ICD-10-CM

## 2021-06-20 DIAGNOSIS — R0602 Shortness of breath: Secondary | ICD-10-CM | POA: Diagnosis not present

## 2021-06-20 DIAGNOSIS — I259 Chronic ischemic heart disease, unspecified: Secondary | ICD-10-CM | POA: Diagnosis not present

## 2021-06-20 DIAGNOSIS — B029 Zoster without complications: Secondary | ICD-10-CM

## 2021-06-20 DIAGNOSIS — M792 Neuralgia and neuritis, unspecified: Secondary | ICD-10-CM

## 2021-06-20 DIAGNOSIS — E440 Moderate protein-calorie malnutrition: Secondary | ICD-10-CM

## 2021-06-20 DIAGNOSIS — Z66 Do not resuscitate: Secondary | ICD-10-CM

## 2021-06-20 DIAGNOSIS — Z515 Encounter for palliative care: Secondary | ICD-10-CM

## 2021-06-20 MED ORDER — AMLODIPINE BESYLATE 10 MG PO TABS
10.0000 mg | ORAL_TABLET | Freq: Every day | ORAL | Status: DC
  Administered 2021-06-20 – 2021-07-09 (×20): 10 mg via ORAL
  Filled 2021-06-20 (×20): qty 1

## 2021-06-20 MED ORDER — SODIUM BICARBONATE 8.4 % IV SOLN
INTRAVENOUS | Status: DC
  Filled 2021-06-20 (×5): qty 1000

## 2021-06-20 NOTE — Plan of Care (Signed)

## 2021-06-20 NOTE — Evaluation (Signed)
Occupational Therapy Evaluation Patient Details Name: Alyssa [name] MRN: [id number] DOB: 12-28-1940 Today's Date: 06/20/2021    History of Present Illness Pt is a 80 y/o female admitted 8/29  secondary to worsening shortness of breath, rash in the left flank/abdomen that progressed to the back, productive cough and weight loss. Found to have shingles, hypoxic respiratory failure and hypertensive urgency. PMH inclues PVD, COPD, HTN, and covid.   Clinical Impression   Patient admitted for the diagnosis above.  PTA se lives with a roommate that assists with patient's mobility, but patient reports independence with ADL.  Patient states she takes sink baths, and showers a few times a week form a sit/stand level.  Deficits are pain, weakness and poor balance impacting independence.  Currently she is needing up to Mod A for basic mobility and lower body ADL.  Acute OT to follow in the acute setting.  SNF is recommended post acute due to current level of assist, and insufficient assist as home.       Follow Up Recommendations  SNF    Equipment Recommendations  3 in 1 bedside commode;Wheelchair (measurements OT);Wheelchair cushion (measurements OT)    Recommendations for Other Services       Precautions / Restrictions Precautions Precautions: Fall Restrictions Weight Bearing Restrictions: No      Mobility Bed Mobility Overal bed mobility: Needs Assistance Bed Mobility: Rolling;Supine to Sit;Sit to Supine Rolling: Supervision   Supine to sit: Min assist;HOB elevated Sit to supine: Mod assist     Patient Response: Flat affect  Transfers                      Balance Overall balance assessment: Needs assistance Sitting-balance support: Bilateral upper extremity supported;Feet supported Sitting balance-Leahy Scale: Poor   Postural control: Posterior lean Standing balance support: Bilateral upper extremity supported;During functional activity Standing balance-Leahy  Scale: Poor Standing balance comment: Reliant on BUE support                           ADL either performed or assessed with clinical judgement   ADL Overall ADL's : Needs assistance/impaired     Grooming: Wash/dry hands;Sitting;Min guard           Upper Body Dressing : Minimal assistance;Sitting   Lower Body Dressing: Moderate assistance;Sitting/lateral leans                       Vision Patient Visual Report: No change from baseline       Perception     Praxis      Pertinent Vitals/Pain Faces Pain Scale: Hurts even more Pain Location: back Pain Descriptors / Indicators: Aching;Grimacing;Guarding Pain Intervention(s): Monitored during session     Hand Dominance Right   Extremity/Trunk Assessment Upper Extremity Assessment Upper Extremity Assessment: Generalized weakness   Lower Extremity Assessment Lower Extremity Assessment: Defer to PT evaluation   Cervical / Trunk Assessment Cervical / Trunk Assessment: Kyphotic   Communication Communication Communication: No difficulties   Cognition Arousal/Alertness: Awake/alert Behavior During Therapy: WFL for tasks assessed/performed Overall Cognitive Status: No family/caregiver present to determine baseline cognitive functioning                                 General Comments: following commands, answering yes/no to most questions.  Home Living Family/patient expects to be discharged to:: Private residence Living Arrangements: Non-relatives/Friends Available Help at Discharge: Friend(s);Available PRN/intermittently Type of Home: House Home Access: Level entry     Home Layout: One level     Bathroom Shower/Tub: Teacher, early years/pre: Handicapped height Bathroom Accessibility: Yes How Accessible: Accessible via walker Home Equipment: Redkey - 2 wheels;Walker - 4 wheels;Tub bench          Prior Functioning/Environment Level of  Independence: Independent with assistive device(s)        Comments: Uses RW for mobility and reports independent with ADLs, light IADL, and med management        OT Problem List: Decreased strength;Decreased activity tolerance;Impaired balance (sitting and/or standing);Decreased knowledge of use of DME or AE;Pain      OT Treatment/Interventions: Self-care/ADL training;Therapeutic exercise;Therapeutic activities;Cognitive remediation/compensation;Balance training;Patient/family education    OT Goals(Current goals can be found in the care plan section) Acute Rehab OT Goals Patient Stated Goal: feel better and go home OT Goal Formulation: With patient Time For Goal Achievement: 07/04/21 Potential to Achieve Goals: Fair ADL Goals Pt Will Perform Grooming: with set-up;sitting;standing Pt Will Perform Lower Body Dressing: with set-up;sit to/from stand Pt Will Transfer to Toilet: with modified independence;ambulating;regular height toilet Pt/caregiver will Perform Home Exercise Program: Increased strength;Both right and left upper extremity;With theraband;With Supervision;With written HEP provided  OT Frequency: Min 2X/week   Barriers to D/C: Decreased caregiver support          Co-evaluation              AM-PAC OT "6 Clicks" Daily Activity     Outcome Measure Help from another person eating meals?: None Help from another person taking care of personal grooming?: A Little Help from another person toileting, which includes using toliet, bedpan, or urinal?: A Lot Help from another person bathing (including washing, rinsing, drying)?: A Lot Help from another person to put on and taking off regular upper body clothing?: A Little Help from another person to put on and taking off regular lower body clothing?: A Lot 6 Click Score: 16   End of Session Equipment Utilized During Treatment: Oxygen  Activity Tolerance: Patient limited by pain;Patient limited by fatigue Patient left:  in bed;with call bell/phone within reach;with bed alarm set  OT Visit Diagnosis: Unsteadiness on feet (R26.81);Muscle weakness (generalized) (M62.81);Pain                Time: 1219-7588 OT Time Calculation (min): 18 min Charges:  OT General Charges $OT Visit: 1 Visit OT Evaluation $OT Eval Moderate Complexity: 1 Mod  06/20/2021  RP, OTR/L  Acute Rehabilitation Services  Office:  [place] 06/20/2021, 2:41 PM

## 2021-06-20 NOTE — Progress Notes (Signed)
PROGRESS NOTE  Alyssa [name]  DOB: Jul 21, 1941  PCP: [name], DO LGX:[id number]  DOA: 06/15/2021  LOS: 1 day  Hospital Day: 6   Chief Complaint  Patient presents with   Generalized Body Aches    Brief narrative: Alyssa [name] is a 80 y.o. female with PMH significant for COPD, COVID-19 3/22, active smoker, HTN, HLD, PVD, CKD stage stage IV.  Patient was brought to the ED on 8/29 with multiple complaints including chest pain, worsening shortness of breath, rash in the left flank/abdomen that progressed to the back, productive cough and weight loss.  In the ED, patient was hypertensive with blood pressure elevated to 197/109. Labs with hemoglobin 10.2, serum bicarb 16, BUN/creatinine 32/3.17, troponin 31 Chest x-ray revealed left lower lobe collapse. Influenza and COVID-19 screening were negative.   CT scan of the chest abdomen and pelvis showed left lower lobe collapse without visible mass or adenopathy and femoral bypass graft with increased fluid collection at bypass anastomosis measuring 4.8 x 4.1 cm previously 2.6 x 2.6 cm.   Patient was started on valacyclovir, IV fluid and admitted to hospitalist service. Pulmonary consultation was obtained. See below for details  06/20/2021: Acute herpes zoster has dried up.  Patient has continued to have significant pain.  Likely, patient will have chronic pain secondary to herpes zoster.  Palliative care team consulted to assist with pain.  Elevated potassium is noted.  Awaiting repeat BMP for today.  Pursue skilled nursing facility when a bed is available.  Continue to monitor electrolytes closely.  Subjective: -Patient has not a particularly good historian. -Patient is sleepy versus mildly lethargic. -Patient reports that the herpes zoster pain is not optimally controlled.  Assessment/Plan: Acute herpes zoster -Presented with painful rash on the left side of her chest/abdomen and the T8/T9 dermatome. -She has been started  on valacyclovir 1 g oral daily. -Oxycodone for pain control.  She is also on gabapentin.  Not much relief.  I switched her to Lyrica this morning.  Also added capsicin cream. -Continue airborne precautions. 06/20/2021: Optimize pain control.  This pain will likely be chronic.  Palliative care team has been consulted to assist with pain control.  Acute respiratory failure with hypoxia -Patient states he does not use oxygen at home. Currently she is on 2 L oxygen by nasal cannula.   -She currently has left lung collapse probably due to poor respiratory effort because of pain related to shingles.  No evidence of endobronchial obstruction -Continue bronchodilators. -Pulmonary consultation appreciated. -Wean down oxygen as tolerated. 06/20/2021: Likely secondary to splinting secondary to zoster pain.  Resolved significantly.  Hypertensive urgency -On admission blood pressure was noted to be elevated up to 197/109.   -Home blood pressure regimen includes amlodipine 7.5 mg daily, Coreg 12.5 mg twice daily, and clonidine 0.2 mg nightly. -Continue home medication regimen. -Hydralazine IV as needed for elevated blood pressures 06/20/2021:  Patient's blood pressure control has improved.  Will increase amlodipine from 7.5 Mg p.o. once daily to 10 mg p.o. once daily.  Optimize pain control.  Continue to monitor closely.  CKD stage IV Chronic metabolic acidosis -Creatinine at baseline less than 3.  Worsening in last 24 hours because of poor oral intake.  Started normal saline at 100 mL per hour. -Continue sodium bicarbonate. 06/20/2021: Discontinue sodium bicarbonate.  Start D5 water with 150 M EQ of sodium bicarbonate at 75 cc/h.  Patient has acute kidney injury on chronic kidney disease versus progressive chronic kidney disease to stage  IV.  Continue to monitor renal function and electrolytes.  Follow repeat BMP.  Elevated potassium noted.  Patient is on Lokelma 5 Mg p.o. once daily.  Low threshold to consult  nephrology team versus nephrology follow-up on discharge (querry a poor candidate for long-term hemodialysis). Recent Labs    09/29/20 2005 09/30/20 1256 10/01/20 1050 10/02/20 0402 10/03/20 0124 10/17/20 1217 10/17/20 1230 06/15/21 1740 06/17/21 0302 06/18/21 0625 06/19/21 0539  BUN 51* 41* 40* 37* 36* 42* 41* 32* 39* 44* 49*  CREATININE 2.55* 2.30* 2.16* 2.44* 2.22* 2.61* 2.90* 3.17* 2.99* 3.31* 3.34*  CO2 14* 17* 17* 20* 19* 16*  --  16* 12* 17* 15*     Chronic normocytic anemia -Hemoglobin stable. Recent Labs    07/08/20 1230 07/10/20 0137 10/02/20 1143 10/03/20 0124 10/17/20 1230 06/15/21 1740 06/17/21 0302 06/17/21 0432 06/18/21 0625 06/19/21 0539  HGB  --    < >  --    < > 10.2* 10.2*  --  9.7* 10.3* 8.2*  MCV  --    < >  --    < >  --  86.4  --  82.3 84.9 83.8  FERRITIN 1,391*  --  790*  --   --   --  1,455*  --   --   --   TIBC 197*  --  300  --   --   --  286  --   --   --   IRON 32  --  96  --   --   --  43  --   --   --    < > = values in this interval not displayed.    Peripheral vascular disease fluid collection -CT imaging showed concern for increased in size of a fluid collection at bypass anastomosis measuring 4.8 x 4.1 cm.  Asymptomatic.  Can follow-up with vascular as an outpatient. -Continue Plavix, ASA, and statin  Mild dementia/anxiety/depression -Continue Aricept, Cymbalta  Moderate malnutrition -Nutrition consult appreciated.   History of tobacco abuse:  -Patient reports quitting smoking approximately 1 month ago, but reported smoking for approximately 20 years.   Mobility: Encourage ambulation Code Status:   Code Status: DNR  Nutritional status: Body mass index is 19.49 kg/m. Nutrition Problem: Moderate Malnutrition Etiology: chronic illness (dementia, COPD) Signs/Symptoms: mild fat depletion, moderate fat depletion, mild muscle depletion, moderate muscle depletion Diet: Cardiac diet Diet Order             Diet regular Room  service appropriate? Yes with Assist; Fluid consistency: Thin  Diet effective now                  DVT prophylaxis:  heparin injection 5,000 Units Start: 06/16/21 0930   Antimicrobials: None Fluid: None Consultants: Pulmonary Family Communication: None at bedside  Status is: Observation  Remains inpatient appropriate because: Needs further inpatient monitoring, pain control, renal function monitoring  Dispo: The patient is from: Home              Anticipated d/c is to: Home in 2 to 3 days              Patient currently is not medically stable to d/c.   Difficult to place patient No     Infusions:   sodium chloride 75 mL/hr at 06/20/21 1354    Scheduled Meds:  amLODipine  10 mg Oral QHS   arformoterol  15 mcg Nebulization BID   And   budesonide (PULMICORT) nebulizer solution  0.25 mg Nebulization BID   And   revefenacin  175 mcg Nebulization Daily   aspirin EC  81 mg Oral Daily   capsicum   Topical BID   carvedilol  12.5 mg Oral BID   cloNIDine  0.2 mg Oral QHS   clopidogrel  75 mg Oral Daily   donepezil  5 mg Oral Q2200   DULoxetine  30 mg Oral Daily   feeding supplement  237 mL Oral TID BM   heparin  5,000 Units Subcutaneous Q8H   megestrol  40 mg Oral BID   multivitamin with minerals  1 tablet Oral Daily   pantoprazole  40 mg Oral Daily   predniSONE  20 mg Oral Q breakfast   pregabalin  50 mg Oral Daily   rosuvastatin  40 mg Oral Daily   sodium bicarbonate  650 mg Oral Daily   sodium chloride flush  3 mL Intravenous Q12H   sodium zirconium cyclosilicate  5 g Oral Daily   valACYclovir  1,000 mg Oral Daily    Antimicrobials: Anti-infectives (From admission, onward)    Start     Dose/Rate Route Frequency Ordered Stop   06/17/21 0800  valACYclovir (VALTREX) tablet 1,000 mg        1,000 mg Oral Daily 06/16/21 0839 06/23/21 0959   06/16/21 0515  valACYclovir (VALTREX) tablet 1,000 mg        1,000 mg Oral STAT 06/16/21 0502 06/16/21 0543       PRN  meds: acetaminophen **OR** acetaminophen, albuterol, hydrALAZINE, ondansetron **OR** ondansetron (ZOFRAN) IV, oxyCODONE-acetaminophen   Objective: Vitals:   06/20/21 0545 06/20/21 0940  BP: (!) 162/79 (!) 158/81  Pulse: 70 64  Resp: 16 18  Temp: 98.3 F (36.8 C) 98.1 F (36.7 C)  SpO2: 93% 98%    Intake/Output Summary (Last 24 hours) at 06/20/2021 1700 Last data filed at 06/20/2021 1300 Gross per 24 hour  Intake 2112.76 ml  Output 700 ml  Net 1412.76 ml    Filed Weights   06/16/21 2229 06/19/21 0519  Weight: 45 kg 49.9 kg   Weight change:  Body mass index is 19.49 kg/m.   Physical Exam: General exam: Sleepy versus lethargic.  Not in any distress while at rest.. Skin: Dried up post herpetic rash left mid chest/back area.   HEENT: Patient is pale.  No jaundice.   Lungs: Clear to auscultation.   CVS: S1-S2.   GI/Abd soft, nontender, nondistended, bowel sound present CNS: Sleepy versus lethargic.  Will not comply with full exam.    Extremities: No pedal edema, no calf tenderness.  Data Review: I have personally reviewed the laboratory data and studies available.  Recent Labs  Lab 06/15/21 1740 06/17/21 0432 06/18/21 0625 06/19/21 0539  WBC 7.0 9.3 10.9* 11.9*  NEUTROABS 4.1  --  8.0* 10.2*  HGB 10.2* 9.7* 10.3* 8.2*  HCT 31.7* 28.9* 31.4* 24.3*  MCV 86.4 82.3 84.9 83.8  PLT 331 321 323 308    Recent Labs  Lab 06/15/21 1740 06/17/21 0302 06/18/21 0625 06/19/21 0539  NA 136 135 136 133*  K 4.8 5.2* 5.4* 5.6*  CL 108 110 109 110  CO2 16* 12* 17* 15*  GLUCOSE 98 128* 111* 115*  BUN 32* 39* 44* 49*  CREATININE 3.17* 2.99* 3.31* 3.34*  CALCIUM 9.6 9.8 9.5 8.7*     F/u labs ordered Unresulted Labs (From admission, onward)     Start     Ordered   06/21/21 0500  Renal function panel  Tomorrow morning,   R       Question:  Specimen collection method  Answer:  Lab=Lab collect   06/20/21 1310   06/20/21 6160  Basic metabolic panel  ONCE - STAT,   STAT        Question:  Specimen collection method  Answer:  Lab=Lab collect   06/20/21 1659   06/20/21 1311  Renal function panel  Once,   R       Question:  Specimen collection method  Answer:  Lab=Lab collect   06/20/21 1310   06/18/21 0500  CBC with Differential/Platelet  Daily,   R     Question:  Specimen collection method  Answer:  Lab=Lab collect   06/17/21 1346   06/18/21 7371  Basic metabolic panel  Daily,   R     Question:  Specimen collection method  Answer:  Lab=Lab collect   06/17/21 1346            Time spent: 35 minutes  Signed, Bonnell Public, MD [facility] 06/20/2021

## 2021-06-20 NOTE — Consult Note (Signed)
Palliative Care Consult Note                                  Date: 06/20/2021   [name]  DOB: [birth year]  MRN: [id number]  Age / Sex: 80 y.o., female  PCP: [name], DO Referring Physician: Bonnell Public, MD  Reason for Consultation: Pain control  HPI/Patient Profile: Palliative Care consult requested for symptom management (Pain) in this 80 y.o. female  with past medical history of anxiety, CKD IV, COPD, tobaccos use, hypertension, hyperlipidemia, PVD, COVID-19 (05/2020). She was admitted on 06/15/2021 from home with herpes zoster, hypertension, and generalized body aches. Chest x-ray showed left lung collapse, no evidence of obstruction.    Past Medical History:  Diagnosis Date   Acute respiratory failure with hypoxia ([place]) 11/01/2019   Anxiety    Arthritis    Chest pain    Chronic kidney disease    Dyspnea    Elevated troponin 12/13/2018   Headache(784.0)    Hyperlipidemia    Hypertension    Joint pain    Leg pain    Peripheral neuropathy    Peripheral vascular disease ([place])    Syncope 09/2020     Subjective:   This NP Osborne Oman reviewed medical records, received report from team, assessed the patient and then met at the patient's bedside with patient to discuss diagnosis, prognosis, GOC, EOL wishes disposition and options.   Concept of Palliative Care was introduced as specialized medical care for people and their families living with serious illness.  It focuses on providing relief from the symptoms and stress of a serious illness.  The goal is to improve quality of life for both the patient and the family. Values and goals of care important to patient and family were attempted to be elicited.  I created space and opportunity for patient to explore state of health prior to admission, thoughts, and feelings. Ms. Marksberry states she has been having left sided flank and hip pain x3 weeks.  She lives in the home with her significant other [name].   Prior to admission she was able to perform all ADLs independently. Uses a walker for ambulation assistance. Appetite has been poor but she endorses improvement with [facility]. Her children or friend takes her to any medical appointments and runs errands.    We discussed Her current illness and her continued pain related to herpes zoster. Current regimen was reviewed in addition to previous interventions prior to admission.   Ms. Hardge verbalized understanding of current illness and co-morbidities. She states her pain is much better controlled over the past 2 days. States capsicin cream is extremely helpful. We discussed potential changes to current regimen, however in the setting of reported pain controlled and manageable in addition patient states she does not want adjustments made "don't mess with what isn't broken".   Patient was encouraged to call with questions or concerns.  PMT will continue to support holistically as needed.  Life Review: Lives in the home with Significant Other. 2 children who live locally and assist in care needs. Retired Oncologist. Christian faith.    Objective:   Primary Diagnoses: Present on Admission:  Shortness of breath  PVD (peripheral vascular disease) (HCC)  CKD (chronic kidney disease) stage 4, GFR 15-29 ml/min (HCC)  Weight loss  Essential hypertension  Anemia  COPD  GOLD 0/ still smoking   Collapsed  lung  Depression with anxiety  Zoster   Scheduled Meds:  amLODipine  10 mg Oral QHS   arformoterol  15 mcg Nebulization BID   And   budesonide (PULMICORT) nebulizer solution  0.25 mg Nebulization BID   And   revefenacin  175 mcg Nebulization Daily   aspirin EC  81 mg Oral Daily   capsicum   Topical BID   carvedilol  12.5 mg Oral BID   cloNIDine  0.2 mg Oral QHS   clopidogrel  75 mg Oral Daily   donepezil  5 mg Oral Q2200   DULoxetine  30 mg Oral Daily   feeding  supplement  237 mL Oral TID BM   heparin  5,000 Units Subcutaneous Q8H   megestrol  40 mg Oral BID   multivitamin with minerals  1 tablet Oral Daily   pantoprazole  40 mg Oral Daily   predniSONE  20 mg Oral Q breakfast   pregabalin  50 mg Oral Daily   rosuvastatin  40 mg Oral Daily   sodium bicarbonate  650 mg Oral Daily   sodium chloride flush  3 mL Intravenous Q12H   sodium zirconium cyclosilicate  5 g Oral Daily   valACYclovir  1,000 mg Oral Daily    Continuous Infusions:  sodium chloride 75 mL/hr at 06/20/21 1354    PRN Meds: acetaminophen **OR** acetaminophen, albuterol, hydrALAZINE, ondansetron **OR** ondansetron (ZOFRAN) IV, oxyCODONE-acetaminophen  No Known Allergies  Review of Systems  Musculoskeletal:  Positive for arthralgias and back pain.  Neurological:  Positive for weakness.  Unless otherwise noted, a complete review of systems is negative.  Physical Exam General: NAD, elderly [name]  Cardiovascular: regular rate and rhythm Pulmonary: diminished bilaterally  Abdomen: soft, nontender, + bowel sounds Extremities: no edema, no joint deformities Skin: left-sided zosters, painful to touch, alopecia  Neurological: AAOx3, mood appropriate   Vital Signs:  BP (!) 158/81 (BP Location: Left Arm)   Pulse 64   Temp 98.1 F (36.7 C) (Oral)   Resp 18   Ht 5' 3" (1.6 m)   Wt 49.9 kg   SpO2 98%   BMI 19.49 kg/m  Pain Scale: 0-10 POSS *See Group Information*: S-Acceptable,Sleep, easy to arouse Pain Score: Asleep  SpO2: SpO2: 98 % O2 Device:SpO2: 98 % O2 Flow Rate: .O2 Flow Rate (L/min): 2 L/min  IO: Intake/output summary:  Intake/Output Summary (Last 24 hours) at 06/20/2021 1518 Last data filed at 06/20/2021 1300 Gross per 24 hour  Intake 2112.76 ml  Output 700 ml  Net 1412.76 ml    LBM: Last BM Date: 06/16/21 Baseline Weight: Weight: 45 kg Most recent weight: Weight: 49.9 kg      Palliative Assessment/Data: PPS 30%   Advanced Care  Planning:   Primary Decision Maker: NEXT OF KIN  Code Status/Advance Care Planning: DNR  A discussion was had today regarding advanced directives. Concepts specific to code status, artifical feeding and hydration, continued IV antibiotics and rehospitalization was had.    I created and opportunity to further discuss goals of care in elderly female with multiple co-morbidities. Discussed her full code status with consideration to her current illness. She verbalized understanding expressing wishes for DNR/DNI. Patient states she is 80 years old and would not want life-sustaining measures and for a natural death when that time occurs.   I encouraged ongoing family discussions. Ms. Kuriakose states her family is aware of her wishes. Her son Shamieka Gullo is her medical decision maker.    Assessment & Plan:  SUMMARY OF RECOMMENDATIONS   DNR/DNI-as requested and confirmed by patient Continue with current plan of care  Discussed current pain regimen and options. Patient states her pain is controlled. Does not want any adjustments made given improvement over the past 24-48hrs.  Agree with current regimen Lyrica 50 mg (if pain worsens or becomes uncontrolled would consider increasing dose to 75 mg)  Percocet every 4 hrs PRN (5 doses over the past 24 hours) if pain worsens or becomes uncontrolled would recommend increasing Percocet to 10 mg every 6 hours Capsicum cream  PMT will continue to support and follow as needed. Please call team line with urgent needs.  Palliative Prophylaxis:  Frequent Pain Assessment  Additional Recommendations (Limitations, Scope, Preferences): Full Scope Treatment  Psycho-social/Spiritual:  Desire for further Chaplaincy support: no Additional Recommendations:  Ongoing pain management support    Discharge Planning:  To Be Determined   Discussed with: RN   Patient expressed understanding and was in agreement with this plan.   Time In: 1350 Time Out:  1440 Time Total: 50 min.   Visit consisted of counseling and education dealing with the complex and emotionally intense issues of symptom management and palliative care in the setting of serious and potentially life-threatening illness.Greater than 50%  of this time was spent counseling and coordinating care related to the above assessment and plan.  Signed by:  [name], AGPCNP-BC Palliative Medicine Team  Phone: [phone] Pager: [phone] Amion: Bjorn Pippin   Thank you for allowing the Palliative Medicine Team to assist in the care of this patient. Please utilize secure chat with additional questions, if there is no response within 30 minutes please call the above phone number. Palliative Medicine Team providers are available by phone from 7am to 5pm daily and can be reached through the team cell phone.  Should this patient require assistance outside of these hours, please call the patient's attending physician.

## 2021-06-21 DIAGNOSIS — R0602 Shortness of breath: Secondary | ICD-10-CM | POA: Diagnosis not present

## 2021-06-21 LAB — CBC WITH DIFFERENTIAL/PLATELET
Abs Immature Granulocytes: 0.31 10*3/uL — ABNORMAL HIGH (ref 0.00–0.07)
Basophils Absolute: 0 10*3/uL (ref 0.0–0.1)
Basophils Relative: 0 %
Eosinophils Absolute: 0 10*3/uL (ref 0.0–0.5)
Eosinophils Relative: 0 %
HCT: 25.8 % — ABNORMAL LOW (ref 36.0–46.0)
Hemoglobin: 8.6 g/dL — ABNORMAL LOW (ref 12.0–15.0)
Immature Granulocytes: 4 %
Lymphocytes Relative: 15 %
Lymphs Abs: 1.1 10*3/uL (ref 0.7–4.0)
MCH: 28.1 pg (ref 26.0–34.0)
MCHC: 33.3 g/dL (ref 30.0–36.0)
MCV: 84.3 fL (ref 80.0–100.0)
Monocytes Absolute: 0.3 10*3/uL (ref 0.1–1.0)
Monocytes Relative: 4 %
Neutro Abs: 5.9 10*3/uL (ref 1.7–7.7)
Neutrophils Relative %: 77 %
Platelets: 428 10*3/uL — ABNORMAL HIGH (ref 150–400)
RBC: 3.06 MIL/uL — ABNORMAL LOW (ref 3.87–5.11)
RDW: 17.2 % — ABNORMAL HIGH (ref 11.5–15.5)
WBC: 7.6 10*3/uL (ref 4.0–10.5)
nRBC: 0.5 % — ABNORMAL HIGH (ref 0.0–0.2)

## 2021-06-21 LAB — BASIC METABOLIC PANEL
Anion gap: 13 (ref 5–15)
BUN: 43 mg/dL — ABNORMAL HIGH (ref 8–23)
CO2: 19 mmol/L — ABNORMAL LOW (ref 22–32)
Calcium: 9.1 mg/dL (ref 8.9–10.3)
Chloride: 102 mmol/L (ref 98–111)
Creatinine, Ser: 3.07 mg/dL — ABNORMAL HIGH (ref 0.44–1.00)
GFR, Estimated: 15 mL/min — ABNORMAL LOW (ref >60)
Glucose, Bld: 256 mg/dL — ABNORMAL HIGH (ref 70–99)
Potassium: 6.3 mmol/L (ref 3.5–5.1)
Sodium: 134 mmol/L — ABNORMAL LOW (ref 135–145)

## 2021-06-21 LAB — RENAL FUNCTION PANEL
Albumin: 2.9 g/dL — ABNORMAL LOW (ref 3.5–5.0)
Anion gap: 9 (ref 5–15)
BUN: 43 mg/dL — ABNORMAL HIGH (ref 8–23)
CO2: 20 mmol/L — ABNORMAL LOW (ref 22–32)
Calcium: 9.1 mg/dL (ref 8.9–10.3)
Chloride: 103 mmol/L (ref 98–111)
Creatinine, Ser: 3.13 mg/dL — ABNORMAL HIGH (ref 0.44–1.00)
GFR, Estimated: 15 mL/min — ABNORMAL LOW (ref >60)
Glucose, Bld: 256 mg/dL — ABNORMAL HIGH (ref 70–99)
Phosphorus: 3.3 mg/dL (ref 2.5–4.6)
Potassium: 6.3 mmol/L (ref 3.5–5.1)
Sodium: 132 mmol/L — ABNORMAL LOW (ref 135–145)

## 2021-06-21 LAB — GLUCOSE, CAPILLARY
Glucose-Capillary: 232 mg/dL — ABNORMAL HIGH (ref 70–99)
Glucose-Capillary: 208 mg/dL — ABNORMAL HIGH (ref 70–99)
Glucose-Capillary: 162 mg/dL — ABNORMAL HIGH (ref 70–99)

## 2021-06-21 LAB — HEMOGLOBIN A1C
Hgb A1c MFr Bld: 6.7 % — ABNORMAL HIGH (ref 4.8–5.6)
Mean Plasma Glucose: 145.59 mg/dL

## 2021-06-21 MED ORDER — ACETAMINOPHEN 500 MG PO TABS
1000.0000 mg | ORAL_TABLET | Freq: Three times a day (TID) | ORAL | Status: DC
  Administered 2021-06-21 – 2021-06-28 (×22): 1000 mg via ORAL
  Filled 2021-06-21 (×22): qty 2

## 2021-06-21 MED ORDER — SODIUM BICARBONATE 650 MG PO TABS
650.0000 mg | ORAL_TABLET | Freq: Two times a day (BID) | ORAL | Status: DC
  Administered 2021-06-21 – 2021-07-10 (×38): 650 mg via ORAL
  Filled 2021-06-21 (×39): qty 1

## 2021-06-21 MED ORDER — CAPSAICIN 0.075 % EX CREA
TOPICAL_CREAM | Freq: Three times a day (TID) | CUTANEOUS | Status: DC
  Filled 2021-06-21 (×2): qty 60

## 2021-06-21 MED ORDER — ALBUTEROL SULFATE (2.5 MG/3ML) 0.083% IN NEBU
10.0000 mg | INHALATION_SOLUTION | Freq: Once | RESPIRATORY_TRACT | Status: AC
  Administered 2021-06-21: 10 mg via RESPIRATORY_TRACT
  Filled 2021-06-21: qty 12

## 2021-06-21 MED ORDER — INSULIN ASPART 100 UNIT/ML IV SOLN
10.0000 [IU] | Freq: Once | INTRAVENOUS | Status: AC
  Administered 2021-06-21: 10 [IU] via INTRAVENOUS

## 2021-06-21 MED ORDER — OXYCODONE HCL 5 MG PO TABS
5.0000 mg | ORAL_TABLET | ORAL | Status: DC | PRN
  Administered 2021-06-21 – 2021-07-08 (×13): 5 mg via ORAL
  Filled 2021-06-21 (×14): qty 1

## 2021-06-21 MED ORDER — SODIUM ZIRCONIUM CYCLOSILICATE 10 G PO PACK
10.0000 g | PACK | Freq: Two times a day (BID) | ORAL | Status: DC
  Administered 2021-06-21 – 2021-06-22 (×2): 10 g via ORAL
  Filled 2021-06-21 (×2): qty 1

## 2021-06-21 MED ORDER — INSULIN ASPART 100 UNIT/ML IJ SOLN
0.0000 [IU] | Freq: Every day | INTRAMUSCULAR | Status: DC
  Administered 2021-06-30: 2 [IU] via SUBCUTANEOUS

## 2021-06-21 MED ORDER — PREGABALIN 75 MG PO CAPS
75.0000 mg | ORAL_CAPSULE | Freq: Every day | ORAL | Status: DC
  Administered 2021-06-21 – 2021-07-10 (×20): 75 mg via ORAL
  Filled 2021-06-21 (×20): qty 1

## 2021-06-21 MED ORDER — OXYCODONE HCL 5 MG PO TABS
7.5000 mg | ORAL_TABLET | ORAL | Status: DC | PRN
Start: 2021-06-21 — End: 2021-06-29
  Administered 2021-06-21 – 2021-06-29 (×8): 7.5 mg via ORAL
  Filled 2021-06-21 (×8): qty 2

## 2021-06-21 MED ORDER — ACETAMINOPHEN 325 MG PO TABS: 650.0000 mg | ORAL_TABLET | Freq: Three times a day (TID) | ORAL | Status: DC

## 2021-06-21 MED ORDER — INSULIN ASPART 100 UNIT/ML IJ SOLN
0.0000 [IU] | Freq: Three times a day (TID) | INTRAMUSCULAR | Status: DC
  Administered 2021-06-22 – 2021-06-24 (×3): 1 [IU] via SUBCUTANEOUS
  Administered 2021-06-24: 2 [IU] via SUBCUTANEOUS
  Administered 2021-06-25 – 2021-06-28 (×6): 1 [IU] via SUBCUTANEOUS
  Administered 2021-06-29: 3 [IU] via SUBCUTANEOUS
  Administered 2021-06-29 – 2021-07-02 (×4): 1 [IU] via SUBCUTANEOUS
  Administered 2021-07-03 – 2021-07-04 (×2): 2 [IU] via SUBCUTANEOUS
  Administered 2021-07-05 – 2021-07-08 (×3): 1 [IU] via SUBCUTANEOUS
  Administered 2021-07-09: 2 [IU] via SUBCUTANEOUS

## 2021-06-21 NOTE — Progress Notes (Signed)
PROGRESS NOTE  Alyssa [name]  DOB: May 07, 1941  PCP: [name], DO JSH:[id number]  DOA: 06/15/2021  LOS: 2 days  Hospital Day: 7   Chief Complaint  Patient presents with   Generalized Body Aches    Brief narrative: Alyssa [name] is a 80 y.o. female with PMH significant for COPD, COVID-19 3/22, active smoker, HTN, HLD, PVD, CKD stage stage IV.  Patient was brought to the ED on 8/29 with multiple complaints including chest pain, worsening shortness of breath, rash in the left flank/abdomen that progressed to the back, productive cough and weight loss.  In the ED, patient was hypertensive with blood pressure elevated to 197/109. Labs with hemoglobin 10.2, serum bicarb 16, BUN/creatinine 32/3.17, troponin 31 Chest x-ray revealed left lower lobe collapse. Influenza and COVID-19 screening were negative.   CT scan of the chest abdomen and pelvis showed left lower lobe collapse without visible mass or adenopathy and femoral bypass graft with increased fluid collection at bypass anastomosis measuring 4.8 x 4.1 cm previously 2.6 x 2.6 cm.   Patient was started on valacyclovir, IV fluid and admitted to hospitalist service. Pulmonary consultation was obtained. See below for details  06/20/2021: Acute herpes zoster has dried up.  Patient has continued to have significant pain.  Likely, patient will have chronic pain secondary to herpes zoster.  Palliative care team consulted to assist with pain.  Elevated potassium is noted.  Awaiting repeat BMP for today.  Pursue skilled nursing facility when a bed is available.  Continue to monitor electrolytes closely.  06/21/2021: Patient is more awake and interactive today.  Potassium of 6.3 reported.  Will increase Lokelma to 10 g p.o. twice daily.  IV regular insulin 10 units x 1 dose given.  We will start patient on sliding scale insulin protocol.  Continue IV bicarb drip.  We will also add oral bicarb.  Monitor renal function and electrolytes  closely.  Subjective: -Patient is more interactive today. -Patient is still not able to give coherent history.    Assessment/Plan: Acute herpes zoster -Presented with painful rash on the left side of her chest/abdomen and the T8/T9 dermatome. -She has been started on valacyclovir 1 g oral daily. -Oxycodone for pain control.  She is also on gabapentin.  Not much relief.  I switched her to Lyrica this morning.  Also added capsicin cream. -Continue airborne precautions. 06/20/2021: Optimize pain control.  This pain will likely be chronic.  Palliative care team has been consulted to assist with pain control. 06/21/2021: Continue to optimize pain control.  Acute respiratory failure with hypoxia -Patient states he does not use oxygen at home. Currently she is on 2 L oxygen by nasal cannula.   -She currently has left lung collapse probably due to poor respiratory effort because of pain related to shingles.  No evidence of endobronchial obstruction -Continue bronchodilators. -Pulmonary consultation appreciated. -Wean down oxygen as tolerated. 06/20/2021: Likely secondary to splinting secondary to zoster pain.  Resolved significantly. 06/21/2021: Resolved.  Hypertensive urgency -On admission blood pressure was noted to be elevated up to 197/109.   -Home blood pressure regimen includes amlodipine 7.5 mg daily, Coreg 12.5 mg twice daily, and clonidine 0.2 mg nightly. -Continue home medication regimen. -Hydralazine IV as needed for elevated blood pressures 06/20/2021:  Patient's blood pressure control has improved.  Will increase amlodipine from 7.5 Mg p.o. once daily to 10 mg p.o. once daily.  Optimize pain control.  Continue to monitor closely. 2022: Continue to monitor closely.  Last blood  pressure today was 168/84 mmHg.  CKD stage IV Chronic metabolic acidosis -Creatinine at baseline less than 3.  Worsening in last 24 hours because of poor oral intake.  Started normal saline at 100 mL per  hour. -Continue sodium bicarbonate. 06/20/2021: Discontinue sodium bicarbonate.  Start D5 water with 150 M EQ of sodium bicarbonate at 75 cc/h.  Patient has acute kidney injury on chronic kidney disease versus progressive chronic kidney disease to stage IV.  Continue to monitor renal function and electrolytes.  Follow repeat BMP.  Elevated potassium noted.  Patient is on Lokelma 5 Mg p.o. once daily.  Low threshold to consult nephrology team versus nephrology follow-up on discharge (querry a poor candidate for long-term hemodialysis). Recent Labs    09/30/20 1256 10/01/20 1050 10/02/20 0402 10/03/20 0124 10/17/20 1217 10/17/20 1230 06/15/21 1740 06/17/21 0302 06/18/21 0625 06/19/21 0539 06/21/21 1330  BUN 41* 40* 37* 36* 42* 41* 32* 39* 44* 49* 43*  43*  CREATININE 2.30* 2.16* 2.44* 2.22* 2.61* 2.90* 3.17* 2.99* 3.31* 3.34* 3.07*  3.13*  CO2 17* 17* 20* 19* 16*  --  16* 12* 17* 15* 19*  20*     Chronic normocytic anemia -Hemoglobin stable. Recent Labs    07/08/20 1230 07/10/20 0137 10/02/20 1143 10/03/20 0124 06/15/21 1740 06/17/21 0302 06/17/21 0432 06/18/21 0625 06/19/21 0539 06/21/21 1330  HGB  --    < >  --    < > 10.2*  --  9.7* 10.3* 8.2* 8.6*  MCV  --    < >  --    < > 86.4  --  82.3 84.9 83.8 84.3  FERRITIN 1,391*  --  790*  --   --  1,455*  --   --   --   --   TIBC 197*  --  300  --   --  286  --   --   --   --   IRON 32  --  96  --   --  43  --   --   --   --    < > = values in this interval not displayed.    Peripheral vascular disease fluid collection -CT imaging showed concern for increased in size of a fluid collection at bypass anastomosis measuring 4.8 x 4.1 cm.  Asymptomatic.  Can follow-up with vascular as an outpatient. -Continue Plavix, ASA, and statin  Mild dementia/anxiety/depression -Continue Aricept, Cymbalta  Moderate malnutrition -Nutrition consult appreciated.   History of tobacco abuse:  -Patient reports quitting smoking  approximately 1 month ago, but reported smoking for approximately 20 years.   Mobility: Encourage ambulation Code Status:   Code Status: DNR  Nutritional status: Body mass index is 19.49 kg/m. Nutrition Problem: Moderate Malnutrition Etiology: chronic illness (dementia, COPD) Signs/Symptoms: mild fat depletion, moderate fat depletion, mild muscle depletion, moderate muscle depletion Diet: Cardiac diet Diet Order             Diet regular Room service appropriate? Yes with Assist; Fluid consistency: Thin  Diet effective now                  DVT prophylaxis:  heparin injection 5,000 Units Start: 06/16/21 0930   Antimicrobials: None Fluid: None Consultants: Pulmonary Family Communication: None at bedside  Status is: Observation  Remains inpatient appropriate because: Needs further inpatient monitoring, pain control, renal function monitoring  Dispo: The patient is from: Home              Anticipated  d/c is to: Home in 2 to 3 days              Patient currently is not medically stable to d/c.   Difficult to place patient No     Infusions:   sodium bicarbonate 150 mEq in D5W infusion 75 mL/hr at 06/21/21 1041    Scheduled Meds:  acetaminophen  1,000 mg Oral TID   amLODipine  10 mg Oral QHS   arformoterol  15 mcg Nebulization BID   And   budesonide (PULMICORT) nebulizer solution  0.25 mg Nebulization BID   And   revefenacin  175 mcg Nebulization Daily   aspirin EC  81 mg Oral Daily   capsicum   Topical TID   carvedilol  12.5 mg Oral BID   cloNIDine  0.2 mg Oral QHS   clopidogrel  75 mg Oral Daily   donepezil  5 mg Oral Q2200   DULoxetine  30 mg Oral Daily   feeding supplement  237 mL Oral TID BM   heparin  5,000 Units Subcutaneous Q8H   insulin aspart  0-5 Units Subcutaneous QHS   insulin aspart  0-6 Units Subcutaneous TID WC   megestrol  40 mg Oral BID   multivitamin with minerals  1 tablet Oral Daily   pantoprazole  40 mg Oral Daily   predniSONE  20 mg  Oral Q breakfast   pregabalin  75 mg Oral Daily   rosuvastatin  40 mg Oral Daily   sodium bicarbonate  650 mg Oral BID   sodium chloride flush  3 mL Intravenous Q12H   sodium zirconium cyclosilicate  10 g Oral BID   valACYclovir  1,000 mg Oral Daily    Antimicrobials: Anti-infectives (From admission, onward)    Start     Dose/Rate Route Frequency Ordered Stop   06/17/21 0800  valACYclovir (VALTREX) tablet 1,000 mg        1,000 mg Oral Daily 06/16/21 0839 06/23/21 0959   06/16/21 0515  valACYclovir (VALTREX) tablet 1,000 mg        1,000 mg Oral STAT 06/16/21 0502 06/16/21 0543       PRN meds: albuterol, hydrALAZINE, ondansetron **OR** ondansetron (ZOFRAN) IV, oxyCODONE, oxyCODONE   Objective: Vitals:   06/21/21 1031 06/21/21 1702  BP: (!) 158/84   Pulse: 68   Resp: 18   Temp: 98.6 F (37 C)   SpO2: 95% 94%    Intake/Output Summary (Last 24 hours) at 06/21/2021 1836 Last data filed at 06/21/2021 1300 Gross per 24 hour  Intake 1284.21 ml  Output 1100 ml  Net 184.21 ml    Filed Weights   06/16/21 2229 06/19/21 0519  Weight: 45 kg 49.9 kg   Weight change:  Body mass index is 19.49 kg/m.   Physical Exam: General exam: Sleepy versus lethargic.  Not in any distress while at rest.. Skin: Dried up post herpetic rash left mid chest/back area.   HEENT: Patient is pale.  No jaundice.   Lungs: Clear to auscultation.   CVS: S1-S2.   GI/Abd soft, nontender, nondistended, bowel sound present CNS: Sleepy versus lethargic.  Will not comply with full exam.    Extremities: No pedal edema, no calf tenderness.  Data Review: I have personally reviewed the laboratory data and studies available.  Recent Labs  Lab 06/15/21 1740 06/17/21 0432 06/18/21 0625 06/19/21 0539 06/21/21 1330  WBC 7.0 9.3 10.9* 11.9* 7.6  NEUTROABS 4.1  --  8.0* 10.2* 5.9  HGB 10.2* 9.7* 10.3* 8.2*  8.6*  HCT 31.7* 28.9* 31.4* 24.3* 25.8*  MCV 86.4 82.3 84.9 83.8 84.3  PLT 331 321 323 308 428*     Recent Labs  Lab 06/15/21 1740 06/17/21 0302 06/18/21 0625 06/19/21 0539 06/21/21 1330  NA 136 135 136 133* 134*  132*  K 4.8 5.2* 5.4* 5.6* 6.3*  6.3*  CL 108 110 109 110 102  103  CO2 16* 12* 17* 15* 19*  20*  GLUCOSE 98 128* 111* 115* 256*  256*  BUN 32* 39* 44* 49* 43*  43*  CREATININE 3.17* 2.99* 3.31* 3.34* 3.07*  3.13*  CALCIUM 9.6 9.8 9.5 8.7* 9.1  9.1  PHOS  --   --   --   --  3.3     F/u labs ordered Unresulted Labs (From admission, onward)     Start     Ordered   06/22/21 0500  Renal function panel  Tomorrow morning,   R       Question:  Specimen collection method  Answer:  Lab=Lab collect   06/21/21 1556   06/21/21 1606  Hemoglobin A1c  Once,   R       Comments: To assess prior glycemic control   Question:  Specimen collection method  Answer:  Lab=Lab collect   06/21/21 1606   06/18/21 0500  CBC with Differential/Platelet  Daily,   R     Question:  Specimen collection method  Answer:  Lab=Lab collect   06/17/21 1346            Time spent: 35 minutes  Signed, Bonnell Public, MD [facility] 06/21/2021

## 2021-06-21 NOTE — Progress Notes (Addendum)
   Palliative Medicine Inpatient Follow Up Note  Reason for Consultation: Pain control   HPI/Patient Profile: Palliative Care consult requested for symptom management (Pain) in this 80 y.o. female  with past medical history of anxiety, CKD IV, COPD, tobaccos use, hypertension, hyperlipidemia, PVD, COVID-19 (05/2020). She was admitted on 06/15/2021 from home with herpes zoster, hypertension, and generalized body aches. Chest x-ray showed left lung collapse, no evidence of obstruction.    Today's Discussion (06/21/2021):  *Please note that this is a verbal dictation therefore any spelling or grammatical errors are due to the "[place]" system interpretation.  Chart reviewed.   I met with Mardene Celeste at bedside. She shares with me this morning that she is terribly uncomfortable in 10/10 pain. I shared with her that it does not appear she has received any medications since last night. We discussed a plan to alter some of her present medications for better pain control.   I spoke to patients day RN, Judson Roch to inform her of the need for patient to receive some paint medications.   Questions and concerns addressed   Objective Assessment: Vital Signs Vitals:   06/20/21 1932 06/20/21 2049  BP:  (!) 129/96  Pulse:  74  Resp:  16  Temp:  97.6 F (36.4 C)  SpO2: 97% 98%    Intake/Output Summary (Last 24 hours) at 06/21/2021 0844 Last data filed at 06/21/2021 0142 Gross per 24 hour  Intake 1404.21 ml  Output 1075 ml  Net 329.21 ml   Last Weight  Most recent update: 06/19/2021  5:42 AM    Weight  49.9 kg (110 lb 0.2 oz)            Physical Exam General: NAD, elderly [name]  Cardiovascular: regular rate and rhythm Pulmonary: diminished bilaterally  Abdomen: soft, nontender, + bowel sounds Extremities: no edema, no joint deformities Skin: left-sided zosters, painful to touch, alopecia  Neurological: AAOx3, mood appropriate   SUMMARY OF RECOMMENDATIONS   DNR/DNI   Continue with current plan of care  Discussed current pain regimen and options, modification as below Agree with current regimen Lyrica increased to 44m PO Qday Cymbalta 31mPO Qday - if needed could increase to 6071mn Tuesday Tylenol 1g PO TID Capsicum cream TID Oxycodone 5/7.5mg12m Q4H PRN moderate/severe pain  PMT will continue to support and follow as needed. Please call team line with urgent needs.  Time Spent: 35 Greater than 50% of the time was spent in counseling and coordination of care ______________________________________________________________________________________ [facility] Team Cell Phone: [phone] utilize secure chat with additional questions, if there is no response within 30 minutes please call the above phone number  Palliative Medicine Team providers are available by phone from 7am to 7pm daily and can be reached through the team cell phone.  Should this patient require assistance outside of these hours, please call the patient's attending physician.

## 2021-06-22 DIAGNOSIS — I259 Chronic ischemic heart disease, unspecified: Secondary | ICD-10-CM | POA: Diagnosis not present

## 2021-06-22 DIAGNOSIS — N184 Chronic kidney disease, stage 4 (severe): Secondary | ICD-10-CM | POA: Diagnosis not present

## 2021-06-22 DIAGNOSIS — R52 Pain, unspecified: Secondary | ICD-10-CM

## 2021-06-22 DIAGNOSIS — R0602 Shortness of breath: Secondary | ICD-10-CM | POA: Diagnosis not present

## 2021-06-22 LAB — RENAL FUNCTION PANEL
Albumin: 2.5 g/dL — ABNORMAL LOW (ref 3.5–5.0)
Anion gap: 8 (ref 5–15)
BUN: 46 mg/dL — ABNORMAL HIGH (ref 8–23)
CO2: 24 mmol/L (ref 22–32)
Calcium: 8.5 mg/dL — ABNORMAL LOW (ref 8.9–10.3)
Chloride: 103 mmol/L (ref 98–111)
Creatinine, Ser: 3.02 mg/dL — ABNORMAL HIGH (ref 0.44–1.00)
GFR, Estimated: 15 mL/min — ABNORMAL LOW (ref >60)
Glucose, Bld: 150 mg/dL — ABNORMAL HIGH (ref 70–99)
Phosphorus: 3.3 mg/dL (ref 2.5–4.6)
Potassium: 4.4 mmol/L (ref 3.5–5.1)
Sodium: 135 mmol/L (ref 135–145)

## 2021-06-22 LAB — GLUCOSE, CAPILLARY
Glucose-Capillary: 130 mg/dL — ABNORMAL HIGH (ref 70–99)
Glucose-Capillary: 135 mg/dL — ABNORMAL HIGH (ref 70–99)
Glucose-Capillary: 185 mg/dL — ABNORMAL HIGH (ref 70–99)
Glucose-Capillary: 173 mg/dL — ABNORMAL HIGH (ref 70–99)

## 2021-06-22 MED ORDER — SODIUM ZIRCONIUM CYCLOSILICATE 10 G PO PACK
10.0000 g | PACK | Freq: Every day | ORAL | Status: DC
  Administered 2021-06-22 – 2021-06-24 (×2): 10 g via ORAL
  Filled 2021-06-22 (×2): qty 1

## 2021-06-22 NOTE — Progress Notes (Signed)
PROGRESS NOTE  Alyssa [name]  DOB: 06-29-1941  PCP: [name], DO ZOX:[id number]  DOA: 06/15/2021  LOS: 3 days  Hospital Day: 8   Chief Complaint  Patient presents with   Generalized Body Aches    Brief narrative: Alyssa [name] is a 80 y.o. female with PMH significant for COPD, COVID-19 3/22, active smoker, HTN, HLD, PVD, CKD stage stage IV.  Patient was brought to the ED on 8/29 with multiple complaints including chest pain, worsening shortness of breath, rash in the left flank/abdomen that progressed to the back, productive cough and weight loss.  In the ED, patient was hypertensive with blood pressure elevated to 197/109. Labs with hemoglobin 10.2, serum bicarb 16, BUN/creatinine 32/3.17, troponin 31 Chest x-ray revealed left lower lobe collapse. Influenza and COVID-19 screening were negative.   CT scan of the chest abdomen and pelvis showed left lower lobe collapse without visible mass or adenopathy and femoral bypass graft with increased fluid collection at bypass anastomosis measuring 4.8 x 4.1 cm previously 2.6 x 2.6 cm.   Patient was started on valacyclovir, IV fluid and admitted to hospitalist service. Pulmonary consultation was obtained. See below for details  06/20/2021: Acute herpes zoster has dried up.  Patient has continued to have significant pain.  Likely, patient will have chronic pain secondary to herpes zoster.  Palliative care team consulted to assist with pain.  Elevated potassium is noted.  Awaiting repeat BMP for today.  Pursue skilled nursing facility when a bed is available.  Continue to monitor electrolytes closely.  06/21/2021: Patient is more awake and interactive today.  Potassium of 6.3 reported.  Will increase Lokelma to 10 g p.o. twice daily.  IV regular insulin 10 units x 1 dose given.  We will start patient on sliding scale insulin protocol.  Continue IV bicarb drip.  We will also add oral bicarb.  Monitor renal function and electrolytes  closely.  06/22/2021: Patient seen.  Hyperkalemia has resolved.  Potassium is 4.4 today.  Will discontinue bicarb drip.  We will change Lokelma to 10 g p.o. once daily.  Continue to monitor renal function and electrolytes closely.  Pursue disposition.  Subjective: -Patient has no new complaints. -No fever or chills. -No chest pain. -Postherpetic neuralgia pain is well controlled.    Assessment/Plan: Acute herpes zoster -Presented with painful rash on the left side of her chest/abdomen and the T8/T9 dermatome. -She has been started on valacyclovir 1 g oral daily. -Oxycodone for pain control.  She is also on gabapentin.  Not much relief.  I switched her to Lyrica this morning.  Also added capsicin cream. -Continue airborne precautions. 06/20/2021: Optimize pain control.  This pain will likely be chronic.  Palliative care team has been consulted to assist with pain control. 06/21/2021: Continue to optimize pain control.  Acute respiratory failure with hypoxia -Patient states he does not use oxygen at home. Currently she is on 2 L oxygen by nasal cannula.   -She currently has left lung collapse probably due to poor respiratory effort because of pain related to shingles.  No evidence of endobronchial obstruction -Continue bronchodilators. -Pulmonary consultation appreciated. -Wean down oxygen as tolerated. 06/20/2021: Likely secondary to splinting secondary to zoster pain.  Resolved significantly. 06/21/2021: Resolved.  Hypertensive urgency -On admission blood pressure was noted to be elevated up to 197/109.   -Home blood pressure regimen includes amlodipine 7.5 mg daily, Coreg 12.5 mg twice daily, and clonidine 0.2 mg nightly. -Continue home medication regimen. -Hydralazine IV as needed  for elevated blood pressures 06/20/2021:  Patient's blood pressure control has improved.  Will increase amlodipine from 7.5 Mg p.o. once daily to 10 mg p.o. once daily.  Optimize pain control.  Continue to monitor  closely. 06/22/2021: Blood pressure control has continued to improve.  Continue to monitor and optimize.    CKD stage IV Chronic metabolic acidosis -Creatinine at baseline less than 3.  Worsening in last 24 hours because of poor oral intake.  Started normal saline at 100 mL per hour. -Continue sodium bicarbonate. 06/20/2021: Discontinue sodium bicarbonate.  Start D5 water with 150 M EQ of sodium bicarbonate at 75 cc/h.  Patient has acute kidney injury on chronic kidney disease versus progressive chronic kidney disease to stage IV.  Continue to monitor renal function and electrolytes.  Follow repeat BMP.  Elevated potassium noted.  Patient is on Lokelma 5 Mg p.o. once daily.  Low threshold to consult nephrology team versus nephrology follow-up on discharge (querry a poor candidate for long-term hemodialysis). 07/12/2021: CO2 24 today.  Will discontinue bicarb drip.  Continue oral bicarbonate for now.  Continue to monitor CO2 level.  Continue to monitor renal function and electrolytes.  Potassium is down to 4.4.  Have a low threshold to consult the nephrology team. Recent Labs    10/01/20 1050 10/02/20 0402 10/03/20 0124 10/17/20 1217 10/17/20 1230 06/15/21 1740 06/17/21 0302 06/18/21 0625 06/19/21 0539 06/21/21 1330 06/22/21 0229  BUN 40* 37* 36* 42* 41* 32* 39* 44* 49* 43*  43* 46*  CREATININE 2.16* 2.44* 2.22* 2.61* 2.90* 3.17* 2.99* 3.31* 3.34* 3.07*  3.13* 3.02*  CO2 17* 20* 19* 16*  --  16* 12* 17* 15* 19*  20* 24     Chronic normocytic anemia -Hemoglobin stable. Recent Labs    07/08/20 1230 07/10/20 0137 10/02/20 1143 10/03/20 0124 06/15/21 1740 06/17/21 0302 06/17/21 0432 06/18/21 0625 06/19/21 0539 06/21/21 1330  HGB  --    < >  --    < > 10.2*  --  9.7* 10.3* 8.2* 8.6*  MCV  --    < >  --    < > 86.4  --  82.3 84.9 83.8 84.3  FERRITIN 1,391*  --  790*  --   --  1,455*  --   --   --   --   TIBC 197*  --  300  --   --  286  --   --   --   --   IRON 32  --  96  --   --   43  --   --   --   --    < > = values in this interval not displayed.    Peripheral vascular disease fluid collection -CT imaging showed concern for increased in size of a fluid collection at bypass anastomosis measuring 4.8 x 4.1 cm.  Asymptomatic.  Can follow-up with vascular as an outpatient. -Continue Plavix, ASA, and statin  Mild dementia/anxiety/depression -Continue Aricept, Cymbalta  Moderate malnutrition -Nutrition consult appreciated.   History of tobacco abuse:  -Patient reports quitting smoking approximately 1 month ago, but reported smoking for approximately 20 years.   Mobility: Encourage ambulation Code Status:   Code Status: DNR  Nutritional status: Body mass index is 19.49 kg/m. Nutrition Problem: Moderate Malnutrition Etiology: chronic illness (dementia, COPD) Signs/Symptoms: mild fat depletion, moderate fat depletion, mild muscle depletion, moderate muscle depletion Diet: Cardiac diet Diet Order             Diet  renal with fluid restriction Fluid restriction: 1200 mL Fluid; Room service appropriate? Yes; Fluid consistency: Thin  Diet effective now                  DVT prophylaxis:  heparin injection 5,000 Units Start: 06/16/21 0930   Antimicrobials: None Fluid: None Consultants: Pulmonary Family Communication: None at bedside  Status is: Observation  Remains inpatient appropriate because: Needs further inpatient monitoring, pain control, renal function monitoring  Dispo: The patient is from: Home              Anticipated d/c is to: Home in 2 to 3 days              Patient currently is not medically stable to d/c.   Difficult to place patient No     Infusions:   sodium bicarbonate 150 mEq in D5W infusion 75 mL/hr at 06/21/21 2156    Scheduled Meds:  acetaminophen  1,000 mg Oral TID   amLODipine  10 mg Oral QHS   arformoterol  15 mcg Nebulization BID   And   budesonide (PULMICORT) nebulizer solution  0.25 mg Nebulization BID   And    revefenacin  175 mcg Nebulization Daily   aspirin EC  81 mg Oral Daily   capsicum   Topical TID   carvedilol  12.5 mg Oral BID   cloNIDine  0.2 mg Oral QHS   clopidogrel  75 mg Oral Daily   donepezil  5 mg Oral Q2200   DULoxetine  30 mg Oral Daily   feeding supplement  237 mL Oral TID BM   heparin  5,000 Units Subcutaneous Q8H   insulin aspart  0-5 Units Subcutaneous QHS   insulin aspart  0-6 Units Subcutaneous TID WC   megestrol  40 mg Oral BID   multivitamin with minerals  1 tablet Oral Daily   pantoprazole  40 mg Oral Daily   predniSONE  20 mg Oral Q breakfast   pregabalin  75 mg Oral Daily   rosuvastatin  40 mg Oral Daily   sodium bicarbonate  650 mg Oral BID   sodium chloride flush  3 mL Intravenous Q12H   sodium zirconium cyclosilicate  10 g Oral Daily    Antimicrobials: Anti-infectives (From admission, onward)    Start     Dose/Rate Route Frequency Ordered Stop   06/17/21 0800  valACYclovir (VALTREX) tablet 1,000 mg        1,000 mg Oral Daily 06/16/21 0839 06/22/21 0900   06/16/21 0515  valACYclovir (VALTREX) tablet 1,000 mg        1,000 mg Oral STAT 06/16/21 0502 06/16/21 0543       PRN meds: albuterol, hydrALAZINE, ondansetron **OR** ondansetron (ZOFRAN) IV, oxyCODONE, oxyCODONE   Objective: Vitals:   06/22/21 0859 06/22/21 0903  BP: 140/82 140/82  Pulse: 77 77  Resp:  18  Temp:  99 F (37.2 C)  SpO2:  93%    Intake/Output Summary (Last 24 hours) at 06/22/2021 1538 Last data filed at 06/22/2021 1205 Gross per 24 hour  Intake 1996.29 ml  Output 650 ml  Net 1346.29 ml    Filed Weights   06/16/21 2229 06/19/21 0519  Weight: 45 kg 49.9 kg   Weight change:  Body mass index is 19.49 kg/m.   Physical Exam: General exam: Patient is awake and alert.  Patient is not in any distress.   Skin: Dried up post herpetic rash left mid chest/back area.   HEENT: Patient is pale.  No jaundice.   Lungs: Clear to auscultation.   CVS: S1-S2.   GI/Abd soft,  nontender, nondistended, bowel sound present CNS: Sleepy versus lethargic.  Will not comply with full exam.    Extremities: No pedal edema.  Data Review: I have personally reviewed the laboratory data and studies available.  Recent Labs  Lab 06/15/21 1740 06/17/21 0432 06/18/21 0625 06/19/21 0539 06/21/21 1330  WBC 7.0 9.3 10.9* 11.9* 7.6  NEUTROABS 4.1  --  8.0* 10.2* 5.9  HGB 10.2* 9.7* 10.3* 8.2* 8.6*  HCT 31.7* 28.9* 31.4* 24.3* 25.8*  MCV 86.4 82.3 84.9 83.8 84.3  PLT 331 321 323 308 428*    Recent Labs  Lab 06/17/21 0302 06/18/21 0625 06/19/21 0539 06/21/21 1330 06/22/21 0229  NA 135 136 133* 134*  132* 135  K 5.2* 5.4* 5.6* 6.3*  6.3* 4.4  CL 110 109 110 102  103 103  CO2 12* 17* 15* 19*  20* 24  GLUCOSE 128* 111* 115* 256*  256* 150*  BUN 39* 44* 49* 43*  43* 46*  CREATININE 2.99* 3.31* 3.34* 3.07*  3.13* 3.02*  CALCIUM 9.8 9.5 8.7* 9.1  9.1 8.5*  PHOS  --   --   --  3.3 3.3     F/u labs ordered Unresulted Labs (From admission, onward)     Start     Ordered   06/23/21 3016  Basic metabolic panel  Tomorrow morning,   R       Question:  Specimen collection method  Answer:  Lab=Lab collect   06/22/21 1441   06/18/21 0500  CBC with Differential/Platelet  Daily,   R     Question:  Specimen collection method  Answer:  Lab=Lab collect   06/17/21 1346            Time spent: 25 minutes  Signed, Bonnell Public, MD [facility] 06/22/2021

## 2021-06-22 NOTE — Progress Notes (Signed)
      Chart reviewed and updates received from RN. Patient assessed at the bedside. No family present.   Alyssa [name] is awake, alert and oriented. Eating lunch. States appetite is improving. Observed 50% eaten thus far. Reports her pain is much better. States it is the worst first thing in the morning and seams to "ease off during the day". On chart review patient does not seem to be receiving medications during the night. Discussed reasoning of pain being worst in the morning most likely is related to extended periods without medications. Patient verbalized understanding. She states she does not usually wake up at night however if she does and is having pain she will ask her nurse.   Reviewed current regimen which she was able to also speak to. Given achieved level of comfort no changes to be made.   All questions answered and support provided.   Plan -DNR/DNI -Continue current plan of care -discussed current pain regimen and pattern to moderate-severe pain in the mornings with notation she is not taking medications overnight.  -Agree with current regimen, no changes   -Lyrica 75 mg daily  -Tylenol 1g po TID -Cymbalta 30 mg daily (may increase to 60 mg if no improvement)  -Capsicum cream TID  -Oxy IR 5/7.5 mg Q4H PRN for moderate/severe pain -PMT will continue to support and follow as needed. Please call with urgent needs.   Time Total: 35 min.   Visit consisted of counseling and education dealing with the complex and emotionally intense issues of symptom management and palliative care in the setting of serious and potentially life-threatening illness.Greater than 50%  of this time was spent counseling and coordinating care related to the above assessment and plan.  [name], AGPCNP-BC  Palliative Medicine Team [phone]

## 2021-06-23 DIAGNOSIS — R0602 Shortness of breath: Secondary | ICD-10-CM | POA: Diagnosis not present

## 2021-06-23 LAB — BASIC METABOLIC PANEL
Anion gap: 11 (ref 5–15)
BUN: 46 mg/dL — ABNORMAL HIGH (ref 8–23)
CO2: 26 mmol/L (ref 22–32)
Calcium: 8.6 mg/dL — ABNORMAL LOW (ref 8.9–10.3)
Chloride: 96 mmol/L — ABNORMAL LOW (ref 98–111)
Creatinine, Ser: 3.14 mg/dL — ABNORMAL HIGH (ref 0.44–1.00)
GFR, Estimated: 15 mL/min — ABNORMAL LOW (ref >60)
Glucose, Bld: 120 mg/dL — ABNORMAL HIGH (ref 70–99)
Potassium: 4 mmol/L (ref 3.5–5.1)
Sodium: 133 mmol/L — ABNORMAL LOW (ref 135–145)

## 2021-06-23 LAB — GLUCOSE, CAPILLARY
Glucose-Capillary: 130 mg/dL — ABNORMAL HIGH (ref 70–99)
Glucose-Capillary: 149 mg/dL — ABNORMAL HIGH (ref 70–99)
Glucose-Capillary: 169 mg/dL — ABNORMAL HIGH (ref 70–99)
Glucose-Capillary: 137 mg/dL — ABNORMAL HIGH (ref 70–99)

## 2021-06-23 MED ORDER — LACTATED RINGERS IV SOLN: INTRAVENOUS | Status: DC

## 2021-06-23 NOTE — Progress Notes (Signed)
Physical Therapy Treatment Patient Details Name: Alyssa [name] MRN: [id number] DOB: 1941-01-03 Today's Date: 06/23/2021    History of Present Illness Pt is a 80 y/o female admitted 8/29  secondary to worsening shortness of breath, rash in the left flank/abdomen that progressed to the back, productive cough and weight loss. Found to have shingles, hypoxic respiratory failure and hypertensive urgency. PMH inclues PVD, COPD, HTN, and covid.    PT Comments    Pt declining OOB/mobility due to pain, 9/10. Agreeable to exercises in bed. Exercises performed BUE/LE. Pt remained in bed at end of session.    Follow Up Recommendations  SNF     Equipment Recommendations  None recommended by PT    Recommendations for Other Services       Precautions / Restrictions Precautions Precautions: Fall;Other (comment) Precaution Comments: airborn precautions for shingles Restrictions Weight Bearing Restrictions: No    Mobility  Bed Mobility                    Transfers                    Ambulation/Gait                 Stairs             Wheelchair Mobility    Modified Rankin (Stroke Patients Only)       Balance                                            Cognition Arousal/Alertness: Awake/alert Behavior During Therapy: WFL for tasks assessed/performed Overall Cognitive Status: No family/caregiver present to determine baseline cognitive functioning                                 General Comments: Appropriate. Following commands.      Exercises General Exercises - Upper Extremity Shoulder Flexion: AROM;Both;10 reps;Supine Shoulder ABduction: AROM;Both;10 reps;Supine Elbow Flexion: AROM;Both;10 reps;Supine General Exercises - Lower Extremity Ankle Circles/Pumps: AROM;Both;10 reps;Supine Gluteal Sets: AROM;Both;10 reps;Supine Heel Slides: AROM;Right;Left;10 reps;Supine Hip ABduction/ADduction:  AROM;Right;Left;10 reps;Supine    General Comments        Pertinent Vitals/Pain Pain Assessment: 0-10 Pain Score: 9  Pain Location: back Pain Descriptors / Indicators: Grimacing;Guarding;Aching;Discomfort Pain Intervention(s): Limited activity within patient's tolerance;RN gave pain meds during session    Home Living                      Prior Function            PT Goals (current goals can now be found in the care plan section) Acute Rehab PT Goals Patient Stated Goal: decrease pain Progress towards PT goals: Not progressing toward goals - comment (pain)    Frequency    Min 2X/week      PT Plan Current plan remains appropriate    Co-evaluation              AM-PAC PT "6 Clicks" Mobility   Outcome Measure  Help needed turning from your back to your side while in a flat bed without using bedrails?: A Little Help needed moving from lying on your back to sitting on the side of a flat bed without using bedrails?: A Little Help needed moving to and from a bed to  a chair (including a wheelchair)?: A Lot Help needed standing up from a chair using your arms (e.g., wheelchair or bedside chair)?: A Lot Help needed to walk in hospital room?: A Lot Help needed climbing 3-5 steps with a railing? : Total 6 Click Score: 13    End of Session   Activity Tolerance: Patient limited by pain Patient left: in bed;with call bell/phone within reach Nurse Communication: Mobility status PT Visit Diagnosis: Unsteadiness on feet (R26.81);Muscle weakness (generalized) (M62.81);Difficulty in walking, not elsewhere classified (R26.2);Pain     Time: 2552-5894 PT Time Calculation (min) (ACUTE ONLY): 17 min  Charges:  $Therapeutic Exercise: 8-22 mins                     Lorrin Goodell, PT  Office # [phone] Pager [phone]    Lorriane Shire 06/23/2021, 11:36 AM

## 2021-06-23 NOTE — TOC Initial Note (Signed)
Transition of Care [facility]) - Initial/Assessment Note    Patient Details  Name: Alyssa [name] MRN: [id number] Date of Birth: December 09, 1940  Transition of Care [facility]) CM/SW Contact:    Alyssa Feil, LCSW Phone Number: 06/23/2021, 11:11 AM  Clinical Narrative: Talked with patient by phone as she is on isolation for Shingles. Alyssa [name] informed CSW that she lives with a roommate and she works during the day. Patient was agreeable to taking with CSW. Alyssa [name] was informed that SNF for ST rehab had been recommended. When asked, Alyssa [name] informed CSW that she lives with a roommate that works during the day, and.she has not been to a skilled nursing facility for short-term rehab before.  CSW briefly explained ST rehab and the facility search process. Patient was informed that she would be given a list of skilled nursing facilities in [place]. When asked, Alyssa [name] gave permission for her son Alyssa [name] to be contacted.  Attempted to reach son Alyssa [name] [phone]) and message left.               Expected Discharge Plan: [place] Barriers to Discharge: Continued Medical Work up   Patient Goals and CMS Choice Patient states their goals for this hospitalization and ongoing recovery are:: Patient agreeable to ST reat a skilled nursing facility before returning home CMS Medicare.gov Compare Post Acute Care list provided to:: Patient Choice offered to / list presented to : Patient  Expected Discharge Plan and Services Expected Discharge Plan: [place] In-house Referral: Clinical Social Work     Living arrangements for the past 2 months: [place]                                      Prior Living Arrangements/Services Living arrangements for the past 2 months: Single Family Home Lives with:: Other (Comment) (Lives with a roommate) Patient language and need for interpreter reviewed:: No Do you feel safe  going back to the place where you live?: Yes      Need for Family Participation in Patient Care: Yes (Comment) Care giver support system in place?: No (comment)   Criminal Activity/Legal Involvement Pertinent to Current Situation/Hospitalization: No - Comment as needed  Activities of Daily Living Home Assistive Devices/Equipment: Shower chair with back, Walker (specify type) ADL Screening (condition at time of admission) Patient's cognitive ability adequate to safely complete daily activities?: Yes Is the patient deaf or have difficulty hearing?: No Does the patient have difficulty seeing, even when wearing glasses/contacts?: No Does the patient have difficulty concentrating, remembering, or making decisions?: No Patient able to express need for assistance with ADLs?: Yes Does the patient have difficulty dressing or bathing?: No Independently performs ADLs?: Yes (appropriate for developmental age) Does the patient have difficulty walking or climbing stairs?: Yes Weakness of Legs: Both Weakness of Arms/Hands: None  Permission Sought/Granted Permission sought to share information with : Family Supports (CSW given permission to call son Alyssa [name])    Share Information with NAME: Alyssa [name]  Permission granted to share info w AGENCY: SNF's  Permission granted to share info w Relationship: Son  Permission granted to share info w Contact Information: [phone]  Emotional Assessment Appearance:: Other (Comment Required (Talked with patient by phone as she remains on precautions for Shingles) Attitude/Demeanor/Rapport: Engaged Affect (typically observed): Appropriate Orientation: : Oriented to Self, Oriented to Place, Oriented to  Time, Oriented  to Situation Alcohol / Substance Use: Tobacco Use, Alcohol Use, Illicit Drugs (Per H&P, patient quit smoking 1 month ago and does not drink alcohoic beverages or use illicit drugs) Psych Involvement: No (comment)  Admission  diagnosis:  Shortness of breath [R06.02] Collapsed lung [J98.19] Elevated troponin I level [R77.8] Herpes zoster without complication [E99.3] Chest pain due to myocardial ischemia, unspecified ischemic chest pain type [I25.9] Patient Active Problem List   Diagnosis Date Noted   Malnutrition of moderate degree 06/17/2021   Weight loss 06/16/2021   Zoster 06/16/2021   Collapsed lung    COPD  GOLD 0/ still smoking  09/26/2020   Cigarette smoker 09/26/2020   Shortness of breath 07/06/2020   Essential hypertension 07/06/2020   Abdominal pain 07/06/2020   Elevated INR 07/06/2020   Acute respiratory failure due to COVID-19 ([place]) 06/02/2020   Acute renal failure superimposed on chronic kidney disease ([place]) 02/06/2020   Leukocytosis 02/06/2020   Thrombocytosis 02/06/2020   Palliative care encounter    Constipation 01/05/2020   Protein-calorie malnutrition, severe ([place]) 01/04/2020   Acute on chronic renal failure ([place]) 01/04/2020   Hyperkalemia 01/03/2020   AKI (acute kidney injury) ([place]) 01/03/2020   Acute respiratory failure with hypoxia ([place]) 11/01/2019   UTI (urinary tract infection) 11/01/2019   CAP (community acquired pneumonia) 11/01/2019   Acute cystitis without hematuria    Encephalopathy 09/18/2019   SAH (subarachnoid hemorrhage) ([place]) 09/18/2019   Dementia ([place]) 71/69/6789   Metabolic acidosis 38/07/1750   ARF (acute renal failure) ([place]) 09/17/2019   Acute encephalopathy 09/17/2019   Abnormal CT scan, colon    Diarrhea    Diverticulitis 06/07/2019   Depression with anxiety 06/07/2019   Volume overload 03/23/2019   Edema 03/23/2019   Acute renal failure (ARF) ([place]) 03/08/2019   Hypokalemia 03/08/2019   Palliative care by specialist    Goals of care, counseling/discussion    Altered mental status    Encephalopathy, toxic 12/24/2018   Aspiration pneumonia of both lower lobes due to gastric secretions ([place]) 12/24/2018   Non-ST elevation (NSTEMI) myocardial infarction  [facility])    Chest pain    Dyspnea on exertion    Elevated troponin level 12/09/2018   Chronic migraine 03/27/2018   Lumbar radiculopathy 03/27/2018   Gait abnormality 03/27/2018   Hypertensive urgency 12/28/2016   CKD (chronic kidney disease) stage 4, GFR 15-29 ml/min (HCC) 12/28/2016   Anemia 12/28/2016   Chronic pain syndrome 12/28/2016   Benzodiazepine withdrawal without complication ([place]) 02/58/5277   Chronic right shoulder pain 12/06/2016   Fall 09/17/2016   Fracture of humeral head, closed, right, initial encounter 09/17/2016   Rib fractures 09/17/2016   Multiple falls 09/17/2016   Severe muscle deconditioning 09/17/2016   Failure to thrive in adult 09/17/2016   Melena 04/25/2016   Transient hypotension 04/25/2016   Anxiety state 04/25/2016   Tobacco abuse 04/25/2016   Hyperlipidemia 04/25/2016   Syncope 04/24/2016   Near syncope 04/24/2016   Gait difficulty 01/12/2016   Foot drop, left 01/12/2016   Severe recurrent major depression without psychotic features ([place]) 08/28/2015   Spondylolisthesis at L4-L5 level 07/30/2015   PVD (peripheral vascular disease) ([place]) 07/29/2014   Weakness-Bilateral arm/leg 07/29/2014   Numbness-left leg 07/29/2014   Swelling of limb-Legs 07/29/2014   Atherosclerosis of native arteries of the extremities with intermittent claudication 09/30/2011   PCP:  [name], DO Pharmacy:   [facility] [place], [place] - [address] [address] [place]  [place] [address] Phone: [phone] Fax: [phone]  [id number] - [place], [place] - [address] [place] [address] Phone: [phone] Fax: [phone]     Social Determinants of Health (SDOH) Interventions  No SDOH interventions needed or requested at this time.  Readmission Risk Interventions Readmission Risk Prevention Plan 07/10/2020 12/26/2018  Transportation  Screening Complete Complete  Medication Review Press photographer) Complete Complete  PCP or Specialist appointment within 3-5 days of discharge - Not Complete  PCP/Specialist Appt Not Complete comments - pt for snf. Snf MD to see as needed.  [place] or Home Care Consult Complete Not Complete  HRI or Home Care Consult Pt Refusal Comments - NA  SW Recovery Care/Counseling Consult Complete Not Complete  SW Consult Not Complete Comments - NA  Palliative Care Screening Not Applicable Complete  Skilled Nursing Facility Not Applicable Complete  Some recent data might be hidden

## 2021-06-23 NOTE — Progress Notes (Addendum)
PROGRESS NOTE  Alyssa [name] CZY:[id number] DOB: [birth year] DOA: 06/15/2021 PCP: [name], DO  HPI/Recap of past 24 hours: This is a 80 year old female with past medical history significant for COPD, COVID-19 infection in March 2022 hypertension hyperlipidemia peripheral vascular disease, chronic kidney disease stage IV, and active Smoker who was admitted to the emergency department on June 15, 2021 with multiple complaints including chest pain shortness of breath and a rash in the left flank/abdomen which had progressed to the back and a productive cough weight loss..  Chest x-ray revealed left lung collapse when she was diagnosed with shingles and started on valacyclovir and also pulmonary was consulted for the left lung collapse  Subjective: Patient seen and examined at bedside: She is covered with blanket because she says she was cold.  Nurse noted that she still has some open lesions particularly on the left leg near the ankle still in a lot of pain but moderately controlled with medication for pain  Assessment/Plan: Principal Problem:   Shortness of breath Active Problems:   PVD (peripheral vascular disease) ([place])   CKD (chronic kidney disease) stage 4, GFR 15-29 ml/min (HCC)   Anemia   Depression with anxiety   Essential hypertension   COPD  GOLD 0/ still smoking    Collapsed lung   Weight loss   Zoster   Malnutrition of moderate degree  1.  Acute respiratory system infection Patient presented with painful rash on the left side of her chest abdomen at the T8-T9 dermatome Continue valacyclovir 1 g orally Continue pain management with oxycodone and Lyrica as well as capsaicin cream Continue airborne and contact precaution  2.  Acute respiratory failure with hypoxia With left lung collapse likely due to poor inspiratory effort due to pain or shingles Continue bronchodilators Currently on 2 L/min oxygen Continue to attempt to wean down  3.  Hypertensive  urgency Resolved it was 197/109 on admission Continue amlodipine Coreg and clonidine  4.  Chronic kidney disease stage IV Continue IV hydration Continue oral sodium bicarb and bicarb drip was discontinued Hyperkalemia with elevated potassium patient was started on  Lokelma but potassium is 4.4 today so we will discontinue Lokelma  7.  Peripheral vascular disease CT imaging showing a fluid collection at the bypass anastomosis Patient is asymptomatic Continue Plavix aspirin and statin Follow-up with vascular as outpatient  8.  History of tobacco abuse but he has quit smoking about a month ago  9.  Moderate malnutrition We will continue nutritional supplement  Code Status: DNR  Severity of Illness: The appropriate patient status for this patient is INPATIENT. Inpatient status is judged to be reasonable and necessary in order to provide the required intensity of service to ensure the patient's safety. The patient's presenting symptoms, physical exam findings, and initial radiographic and laboratory data in the context of their chronic comorbidities is felt to place them at high risk for further clinical deterioration. Furthermore, it is not anticipated that the patient will be medically stable for discharge from the hospital within 2 midnights of admission. The following factors support the patient status of inpatient.   Continues to need pain control as well as waiting for SNF   * I certify that at the point of admission it is my clinical judgment that the patient will require inpatient hospital care spanning beyond 2 midnights from the point of admission due to high intensity of service, high risk for further deterioration and high frequency of surveillance required.*   Family Communication:  None at bedside but discussed with patient  Disposition Plan:   Status is: Inpatient   Dispo: The patient is from: Home              Anticipated d/c is to: SNF              Anticipated d/c date  is:               Patient currently not medically stable for discharge  Consultants: Pulmonary  Procedures: None  Antimicrobials: Valacyclovir  DVT prophylaxis: Heparin   Objective: Vitals:   06/22/21 1700 06/22/21 2155 06/23/21 0541 06/23/21 0843  BP: 140/78 (!) 163/85 (!) 146/92   Pulse: 75 77 67   Resp: 17 18    Temp: 98.5 F (36.9 C) 98.1 F (36.7 C) 98.1 F (36.7 C)   TempSrc: Oral  Oral   SpO2: 95% 93% 91% 93%  Weight:      Height:        Intake/Output Summary (Last 24 hours) at 06/23/2021 0913 Last data filed at 06/22/2021 1330 Gross per 24 hour  Intake 360 ml  Output 200 ml  Net 160 ml   Filed Weights   06/16/21 2229 06/19/21 0519  Weight: 45 kg 49.9 kg   Body mass index is 19.49 kg/m.  Exam:  General: 80 y.o. year-old female well developed well nourished in no acute distress.  Alert and oriented x3. Cardiovascular: Regular rate and rhythm with no rubs or gallops.  No thyromegaly or JVD noted.   Respiratory: Clear to auscultation with no wheezes or rales. Good inspiratory effort. Abdomen: Soft nontender nondistended with normal bowel sounds x4 quadrants. Musculoskeletal: no extremity edema. 2/4 pulses in all 4 extremities.  She has an open ulcer on the left outer ankle Skin: No ulcerative lesions noted or rashes, Psychiatry: Mood is appropriate for condition and setting Neurology:    Data Reviewed: CBC: Recent Labs  Lab 06/17/21 0432 06/18/21 0625 06/19/21 0539 06/21/21 1330  WBC 9.3 10.9* 11.9* 7.6  NEUTROABS  --  8.0* 10.2* 5.9  HGB 9.7* 10.3* 8.2* 8.6*  HCT 28.9* 31.4* 24.3* 25.8*  MCV 82.3 84.9 83.8 84.3  PLT 321 323 308 389*   Basic Metabolic Panel: Recent Labs  Lab 06/18/21 0625 06/19/21 0539 06/21/21 1330 06/22/21 0229 06/23/21 0433  NA 136 133* 134*  132* 135 133*  K 5.4* 5.6* 6.3*  6.3* 4.4 4.0  CL 109 110 102  103 103 96*  CO2 17* 15* 19*  20* 24 26  GLUCOSE 111* 115* 256*  256* 150* 120*  BUN 44* 49* 43*  43*  46* 46*  CREATININE 3.31* 3.34* 3.07*  3.13* 3.02* 3.14*  CALCIUM 9.5 8.7* 9.1  9.1 8.5* 8.6*  PHOS  --   --  3.3 3.3  --    GFR: Estimated Creatinine Clearance: 11.8 mL/min (A) (by C-G formula based on SCr of 3.14 mg/dL (H)). Liver Function Tests: Recent Labs  Lab 06/17/21 0302 06/21/21 1330 06/22/21 0229  AST 30  --   --   ALT 39  --   --   ALKPHOS 93  --   --   BILITOT 0.6  --   --   PROT 6.8  --   --   ALBUMIN 3.6 2.9* 2.5*   No results for input(s): LIPASE, AMYLASE in the last 168 hours. No results for input(s): AMMONIA in the last 168 hours. Coagulation Profile: No results for input(s): INR, PROTIME in the last 168 hours.  Cardiac Enzymes: No results for input(s): CKTOTAL, CKMB, CKMBINDEX, TROPONINI in the last 168 hours. BNP (last 3 results) No results for input(s): PROBNP in the last 8760 hours. HbA1C: Recent Labs    06/21/21 1616  HGBA1C 6.7*   CBG: Recent Labs  Lab 06/22/21 0640 06/22/21 1138 06/22/21 1650 06/22/21 2157 06/23/21 0656  GLUCAP 130* 135* 185* 173* 130*   Lipid Profile: No results for input(s): CHOL, HDL, LDLCALC, TRIG, CHOLHDL, LDLDIRECT in the last 72 hours. Thyroid Function Tests: No results for input(s): TSH, T4TOTAL, FREET4, T3FREE, THYROIDAB in the last 72 hours. Anemia Panel: No results for input(s): VITAMINB12, FOLATE, FERRITIN, TIBC, IRON, RETICCTPCT in the last 72 hours. Urine analysis:    Component Value Date/Time   COLORURINE YELLOW 06/16/2021 0900   APPEARANCEUR HAZY (A) 06/16/2021 0900   LABSPEC 1.019 06/16/2021 0900   PHURINE 5.0 06/16/2021 0900   GLUCOSEU NEGATIVE 06/16/2021 0900   HGBUR NEGATIVE 06/16/2021 0900   BILIRUBINUR NEGATIVE 06/16/2021 0900   KETONESUR NEGATIVE 06/16/2021 0900   PROTEINUR 100 (A) 06/16/2021 0900   UROBILINOGEN 0.2 08/24/2015 1737   NITRITE NEGATIVE 06/16/2021 0900   LEUKOCYTESUR NEGATIVE 06/16/2021 0900   Sepsis Labs: @LABRCNTIP (procalcitonin:4,lacticidven:4)  ) Recent Results  (from the past 240 hour(s))  Resp Panel by RT-PCR (Flu A&B, Covid) Nasopharyngeal Swab     Status: None   Collection Time: 06/15/21  5:44 PM   Specimen: Nasopharyngeal Swab; Nasopharyngeal(NP) swabs in vial transport medium  Result Value Ref Range Status   SARS Coronavirus 2 by RT PCR NEGATIVE NEGATIVE Final    Comment: (NOTE) SARS-CoV-2 target nucleic acids are NOT DETECTED.  The SARS-CoV-2 RNA is generally detectable in upper respiratory specimens during the acute phase of infection. The lowest concentration of SARS-CoV-2 viral copies this assay can detect is 138 copies/mL. A negative result does not preclude SARS-Cov-2 infection and should not be used as the sole basis for treatment or other patient management decisions. A negative result may occur with  improper specimen collection/handling, submission of specimen other than nasopharyngeal swab, presence of viral mutation(s) within the areas targeted by this assay, and inadequate number of viral copies(<138 copies/mL). A negative result must be combined with clinical observations, patient history, and epidemiological information. The expected result is Negative.  Fact Sheet for Patients:  EntrepreneurPulse.com.au  Fact Sheet for Healthcare Providers:  IncredibleEmployment.be  This test is no t yet approved or cleared by the [place] FDA and  has been authorized for detection and/or diagnosis of SARS-CoV-2 by FDA under an Emergency Use Authorization (EUA). This EUA will remain  in effect (meaning this test can be used) for the duration of the COVID-19 declaration under Section 564(b)(1) of the Act, 21 U.S.C.section 360bbb-3(b)(1), unless the authorization is terminated  or revoked sooner.       Influenza A by PCR NEGATIVE NEGATIVE Final   Influenza B by PCR NEGATIVE NEGATIVE Final    Comment: (NOTE) The Xpert Xpress SARS-CoV-2/FLU/RSV plus assay is intended as an aid in the  diagnosis of influenza from Nasopharyngeal swab specimens and should not be used as a sole basis for treatment. Nasal washings and aspirates are unacceptable for Xpert Xpress SARS-CoV-2/FLU/RSV testing.  Fact Sheet for Patients: EntrepreneurPulse.com.au  Fact Sheet for Healthcare Providers: IncredibleEmployment.be  This test is not yet approved or cleared by the [place] FDA and has been authorized for detection and/or diagnosis of SARS-CoV-2 by FDA under an Emergency Use Authorization (EUA). This EUA will remain in effect (meaning this test can be  used) for the duration of the COVID-19 declaration under Section 564(b)(1) of the Act, 21 U.S.C. section 360bbb-3(b)(1), unless the authorization is terminated or revoked.  Performed at [facility], [place] [address], [place], [place] [address]       Studies: No results found.  Scheduled Meds:  acetaminophen  1,000 mg Oral TID   amLODipine  10 mg Oral QHS   arformoterol  15 mcg Nebulization BID   And   budesonide (PULMICORT) nebulizer solution  0.25 mg Nebulization BID   And   revefenacin  175 mcg Nebulization Daily   aspirin EC  81 mg Oral Daily   capsicum   Topical TID   carvedilol  12.5 mg Oral BID   cloNIDine  0.2 mg Oral QHS   clopidogrel  75 mg Oral Daily   donepezil  5 mg Oral Q2200   DULoxetine  30 mg Oral Daily   feeding supplement  237 mL Oral TID BM   heparin  5,000 Units Subcutaneous Q8H   insulin aspart  0-5 Units Subcutaneous QHS   insulin aspart  0-6 Units Subcutaneous TID WC   megestrol  40 mg Oral BID   multivitamin with minerals  1 tablet Oral Daily   pantoprazole  40 mg Oral Daily   predniSONE  20 mg Oral Q breakfast   pregabalin  75 mg Oral Daily   rosuvastatin  40 mg Oral Daily   sodium bicarbonate  650 mg Oral BID   sodium chloride flush  3 mL Intravenous Q12H   sodium zirconium cyclosilicate  10 g Oral Daily    Continuous Infusions:   LOS: 4 days      Cristal Deer, MD [facility]  To reach me or the doctor on call, go to: [email or web address] Password [email or web address]  06/23/2021, 9:13 AM

## 2021-06-24 DIAGNOSIS — R0602 Shortness of breath: Secondary | ICD-10-CM | POA: Diagnosis not present

## 2021-06-24 LAB — CBC
HCT: 24.7 % — ABNORMAL LOW (ref 36.0–46.0)
Hemoglobin: 8.4 g/dL — ABNORMAL LOW (ref 12.0–15.0)
MCH: 28.4 pg (ref 26.0–34.0)
MCHC: 34 g/dL (ref 30.0–36.0)
MCV: 83.4 fL (ref 80.0–100.0)
Platelets: 438 10*3/uL — ABNORMAL HIGH (ref 150–400)
RBC: 2.96 MIL/uL — ABNORMAL LOW (ref 3.87–5.11)
RDW: 16.4 % — ABNORMAL HIGH (ref 11.5–15.5)
WBC: 11.7 10*3/uL — ABNORMAL HIGH (ref 4.0–10.5)
nRBC: 1.8 % — ABNORMAL HIGH (ref 0.0–0.2)

## 2021-06-24 LAB — GLUCOSE, CAPILLARY
Glucose-Capillary: 126 mg/dL — ABNORMAL HIGH (ref 70–99)
Glucose-Capillary: 218 mg/dL — ABNORMAL HIGH (ref 70–99)
Glucose-Capillary: 168 mg/dL — ABNORMAL HIGH (ref 70–99)
Glucose-Capillary: 163 mg/dL — ABNORMAL HIGH (ref 70–99)

## 2021-06-24 LAB — BASIC METABOLIC PANEL
Anion gap: 11 (ref 5–15)
BUN: 46 mg/dL — ABNORMAL HIGH (ref 8–23)
CO2: 22 mmol/L (ref 22–32)
Calcium: 8.8 mg/dL — ABNORMAL LOW (ref 8.9–10.3)
Chloride: 100 mmol/L (ref 98–111)
Creatinine, Ser: 2.98 mg/dL — ABNORMAL HIGH (ref 0.44–1.00)
GFR, Estimated: 16 mL/min — ABNORMAL LOW (ref >60)
Glucose, Bld: 166 mg/dL — ABNORMAL HIGH (ref 70–99)
Potassium: 4.4 mmol/L (ref 3.5–5.1)
Sodium: 133 mmol/L — ABNORMAL LOW (ref 135–145)

## 2021-06-24 NOTE — TOC Progression Note (Addendum)
Transition of Care [facility]) - Progression Note    Patient Details  Name: Alyssa [name] MRN: [id number] Date of Birth: 1941/05/17  Transition of Care [facility]) CM/SW Contact  [name], LCSW Phone Number: 06/24/2021, 1:27 PM  Clinical Narrative:  Checked skilled nursing facility (SNF) responses in Epic and [facility] accepted patient. Call made to Ms. Rathbone (12:23 pm) and informed her that [facility] her for ST rehab. Talked with patient's nurse and asked her to add a check to [facility] on patient's SNF list to ensure that she considers this facility for ST rehab.    [facility] is additional bed offer. Patient was given other bed offers on 9/6: [facility], [facility], [place], [facility] and [facility].     Expected Discharge Plan: [place] Barriers to Discharge: Continued Medical Work up  Expected Discharge Plan and Services Expected Discharge Plan: [place] In-house Referral: Clinical Social Work     Living arrangements for the past 2 months: Single Family Home                                       Social Determinants of Health (SDOH) Interventions  No SDOH interventions requested or needed at this time.  Readmission Risk Interventions Readmission Risk Prevention Plan 07/10/2020 12/26/2018  Transportation Screening Complete Complete  Medication Review Press photographer) Complete Complete  PCP or Specialist appointment within 3-5 days of discharge - Not Complete  PCP/Specialist Appt Not Complete comments - pt for snf. Snf MD to see as needed.  [place] or Home Care Consult Complete Not Complete  HRI or Home Care Consult Pt Refusal Comments - NA  SW Recovery Care/Counseling Consult Complete Not Complete  SW Consult Not Complete Comments - NA  Palliative Care Screening Not Applicable Complete  Skilled Nursing Facility Not Applicable Complete  Some recent data might be hidden

## 2021-06-24 NOTE — Progress Notes (Signed)
PROGRESS NOTE    Alyssa [name]  JJO:[id number] DOB: 08/21/1941 DOA: 06/15/2021 PCP: [name], DO   Chief Complaint  Patient presents with   Generalized Body Aches   Brief Narrative: 80 year old female with hypertension, HLD, COPD, PVD, CKD stage IV, COVID-19 infection in 2021, dementia presents with pain all over x3 days along with a rash.  She was seen in the ED creatinine 3.1, troponin 31 chest x-ray left lower lobe collapse COVID-19 negative CT of the chest abdomen pelvis showed left lower lobe collapse without visible mass or adenopathy and femoral bypass graft with increased fluid collection and bypass anastomosis measuring 4.8 X4.1 previously 2.6 X2.6 Patient was given valacyclovir IV fluids and admitted for further management for dyspnea due to left lung collapse, hypertension urgency, weight loss, borderline elevated troponin, nongap metabolic acidosis, anemia..  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 Continue to manage rash, continue pain control.  Respiratory status stable.  On supplemental oxygen wean as tolerated.  PT OT as able  Shingles/herpes zoster viral: involving the left flank and left abdomen: Rash/crusting except 1 on the flank.  Continue valacyclovir, continue Lyrica, capsicum cream, oxycodone airbiorne and contact precaution until crusts  Hypertension urgency: BP currently stable, on amlodipine, Coreg, clonidine CKD stage IV/metabolic acidosis low-salt diet.  Continue sodium bicarb.  Monitor renal function closely Recent Labs  Lab 06/19/21 0539  06/21/21 1330 06/22/21 0229 06/23/21 0433 06/24/21 0840  BUN 49* 43*  43* 46* 46* 46*  CREATININE 3.34* 3.07*  3.13* 3.02* 3.14* 2.98*   Hyperkalemia on Lokelma potassium stable  PVD/CT showing fluid collection at the bypass anastomosis patient is symptomatic and Plavix aspirin and statin has had previous collection will need to follow-up with her vascular  History of tobacco abuse-has quit few months ago Moderate protein calorie malnutrition: Augment diet as tolerated Dementia:Cont fall precaution, aspiration precaution.  Continue delirium precaution.  Deconditioning: PT OT and will need SNF COPD HLD on statin Anemia of chronic kidney disease monitor hemoglobin.  So far stable Recent Labs  Lab 06/18/21 0625 06/19/21 0539 06/21/21 1330 06/24/21 0840  HGB 10.3* 8.2* 8.6* 8.4*  HCT 31.4* 24.3* 25.8* 24.7*    Depression with anxiety-mood stable  Diet Order             Diet renal with fluid restriction Fluid restriction: 1200 mL Fluid; Room service appropriate? Yes; Fluid consistency: Thin  Diet effective now                   Nutrition Problem: Moderate Malnutrition Etiology: chronic illness (dementia, COPD) Signs/Symptoms: mild fat depletion, moderate fat depletion, mild muscle depletion, moderate muscle depletion Interventions: Ensure Enlive (each supplement provides 350kcal and 20 grams of protein), MVI, Liberalize Diet Patient's Body mass index is 19.48 kg/m. DVT prophylaxis: heparin injection 5,000 Units Start: 06/16/21 0930 Code Status:   Code Status: DNR  Family Communication: plan of care discussed with patient at bedside. Status is: Inpatient  Remains inpatient appropriate because:Inpatient level of care appropriate due to severity of illness  Dispo: The patient is from: Home  Anticipated d/c is to: SNF              Patient currently is not medically stable to d/c.   Difficult to place patient No  Unresulted Labs (From admission, onward)     None      Medications reviewed:  Scheduled Meds:  acetaminophen  1,000 mg Oral TID   amLODipine  10 mg Oral QHS   arformoterol  15 mcg Nebulization BID   And   budesonide (PULMICORT) nebulizer solution  0.25 mg Nebulization BID   And   revefenacin  175 mcg Nebulization Daily   aspirin EC  81 mg Oral Daily   capsicum   Topical TID   carvedilol  12.5 mg Oral BID   cloNIDine  0.2 mg Oral QHS   clopidogrel  75 mg Oral Daily   donepezil  5 mg Oral Q2200   DULoxetine  30 mg Oral Daily   feeding supplement  237 mL Oral TID BM   heparin  5,000 Units Subcutaneous Q8H   insulin aspart  0-5 Units Subcutaneous QHS   insulin aspart  0-6 Units Subcutaneous TID WC   megestrol  40 mg Oral BID   multivitamin with minerals  1 tablet Oral Daily   pantoprazole  40 mg Oral Daily   predniSONE  20 mg Oral Q breakfast   pregabalin  75 mg Oral Daily   rosuvastatin  40 mg Oral Daily   sodium bicarbonate  650 mg Oral BID   sodium chloride flush  3 mL Intravenous Q12H   sodium zirconium cyclosilicate  10 g Oral Daily   Continuous Infusions:  lactated ringers 50 mL/hr at 06/23/21 2232   Consultants:see note  Procedures:see note Antimicrobials: Anti-infectives (From admission, onward)    Start     Dose/Rate Route Frequency Ordered Stop   06/17/21 0800  valACYclovir (VALTREX) tablet 1,000 mg        1,000 mg Oral Daily 06/16/21 0839 06/22/21 0900   06/16/21 0515  valACYclovir (VALTREX) tablet 1,000 mg        1,000 mg Oral STAT 06/16/21 0502 06/16/21 0543      Culture/Microbiology    Component Value Date/Time   SDES URINE, RANDOM 09/29/2020 2353   SPECREQUEST  09/29/2020 2353    NONE Performed at [facility], [place] [address] [place], [place] [address]    CULT 20,000 COLONIES/mL ENTEROBACTER AEROGENES (A) 09/29/2020 2353   REPTSTATUS 10/01/2020 FINAL 09/29/2020 2353    Other culture-see note  Objective: Vitals: Today's Vitals   06/23/21 2042 06/23/21 2137 06/23/21 2305  06/24/21 0942  BP:  (!) 149/94  (!) 151/90  Pulse:  82  83  Resp:  17  17  Temp:  98.4 F (36.9 C)  98.2 F (36.8 C)  TempSrc:      SpO2: 92% 94%  94%  Weight:  49.9 kg    Height:      PainSc:   Asleep     Intake/Output Summary (Last 24 hours) at 06/24/2021 1012 Last data filed at 06/24/2021 0812 Gross per 24 hour  Intake 1032.5 ml  Output 1400 ml  Net -367.5 ml   Filed Weights   06/16/21 2229 06/19/21 0519 06/23/21 2137  Weight: 45 kg 49.9 kg 49.9 kg   Weight change:   Intake/Output from previous day: 09/06 0701 - 09/07 0700 In: 952.5 [P.O.:600; I.V.:352.5] Out: 1400 [Urine:1400] Intake/Output this shift: Total I/O In: 200 [P.O.:200] Out: -  Filed Weights   06/16/21 2229 06/19/21  4656 06/23/21 2137  Weight: 45 kg 49.9 kg 49.9 kg   Examination:  General exam: AAO at basline, elderly frail, not in distress HEENT:Oral mucosa moist, Ear/Nose WNL grossly,dentition normal. Respiratory system: bilaterally diminished, no use of accessory muscle, non tender. Cardiovascular system: S1 & S2 +,No JVD. Gastrointestinal system: Abdomen soft, NT,ND, BS+. Nervous System:Alert, awake, moving extremities Extremities: no edema, distal peripheral pulses palpable.  Skin: No rashes,no icterus. Zooster rash on left abdomen with cruistation, and on left flank still mild oozing.- MSK: Normal muscle bulk,tone, power Data Reviewed: I have personally reviewed following labs and imaging studies CBC: Recent Labs  Lab 06/18/21 0625 06/19/21 0539 06/21/21 1330 06/24/21 0840  WBC 10.9* 11.9* 7.6 11.7*  NEUTROABS 8.0* 10.2* 5.9  --   HGB 10.3* 8.2* 8.6* 8.4*  HCT 31.4* 24.3* 25.8* 24.7*  MCV 84.9 83.8 84.3 83.4  PLT 323 308 428* 812*   Basic Metabolic Panel: Recent Labs  Lab 06/19/21 0539 06/21/21 1330 06/22/21 0229 06/23/21 0433 06/24/21 0840  NA 133* 134*  132* 135 133* 133*  K 5.6* 6.3*  6.3* 4.4 4.0 4.4  CL 110 102  103 103 96* 100  CO2 15* 19*  20* 24 26 22   GLUCOSE  115* 256*  256* 150* 120* 166*  BUN 49* 43*  43* 46* 46* 46*  CREATININE 3.34* 3.07*  3.13* 3.02* 3.14* 2.98*  CALCIUM 8.7* 9.1  9.1 8.5* 8.6* 8.8*  PHOS  --  3.3 3.3  --   --    GFR: Estimated Creatinine Clearance: 12.5 mL/min (A) (by C-G formula based on SCr of 2.98 mg/dL (H)). Liver Function Tests: Recent Labs  Lab 06/21/21 1330 06/22/21 0229  ALBUMIN 2.9* 2.5*   No results for input(s): LIPASE, AMYLASE in the last 168 hours. No results for input(s): AMMONIA in the last 168 hours. Coagulation Profile: No results for input(s): INR, PROTIME in the last 168 hours. Cardiac Enzymes: No results for input(s): CKTOTAL, CKMB, CKMBINDEX, TROPONINI in the last 168 hours. BNP (last 3 results) No results for input(s): PROBNP in the last 8760 hours. HbA1C: Recent Labs    06/21/21 1616  HGBA1C 6.7*   CBG: Recent Labs  Lab 06/23/21 0656 06/23/21 1200 06/23/21 1745 06/23/21 2137 06/24/21 0653  GLUCAP 130* 149* 169* 137* 126*   Lipid Profile: No results for input(s): CHOL, HDL, LDLCALC, TRIG, CHOLHDL, LDLDIRECT in the last 72 hours. Thyroid Function Tests: No results for input(s): TSH, T4TOTAL, FREET4, T3FREE, THYROIDAB in the last 72 hours. Anemia Panel: No results for input(s): VITAMINB12, FOLATE, FERRITIN, TIBC, IRON, RETICCTPCT in the last 72 hours. Sepsis Labs: No results for input(s): PROCALCITON, LATICACIDVEN in the last 168 hours.  Recent Results (from the past 240 hour(s))  Resp Panel by RT-PCR (Flu A&B, Covid) Nasopharyngeal Swab     Status: None   Collection Time: 06/15/21  5:44 PM   Specimen: Nasopharyngeal Swab; Nasopharyngeal(NP) swabs in vial transport medium  Result Value Ref Range Status   SARS Coronavirus 2 by RT PCR NEGATIVE NEGATIVE Final    Comment: (NOTE) SARS-CoV-2 target nucleic acids are NOT DETECTED.  The SARS-CoV-2 RNA is generally detectable in upper respiratory specimens during the acute phase of infection. The lowest concentration of  SARS-CoV-2 viral copies this assay can detect is 138 copies/mL. A negative result does not preclude SARS-Cov-2 infection and should not be used as the sole basis for treatment or other patient management decisions. A negative result may occur with  improper specimen collection/handling, submission of specimen  other than nasopharyngeal swab, presence of viral mutation(s) within the areas targeted by this assay, and inadequate number of viral copies(<138 copies/mL). A negative result must be combined with clinical observations, patient history, and epidemiological information. The expected result is Negative.  Fact Sheet for Patients:  EntrepreneurPulse.com.au  Fact Sheet for Healthcare Providers:  IncredibleEmployment.be  This test is no t yet approved or cleared by the [place] FDA and  has been authorized for detection and/or diagnosis of SARS-CoV-2 by FDA under an Emergency Use Authorization (EUA). This EUA will remain  in effect (meaning this test can be used) for the duration of the COVID-19 declaration under Section 564(b)(1) of the Act, 21 U.S.C.section 360bbb-3(b)(1), unless the authorization is terminated  or revoked sooner.       Influenza A by PCR NEGATIVE NEGATIVE Final   Influenza B by PCR NEGATIVE NEGATIVE Final    Comment: (NOTE) The Xpert Xpress SARS-CoV-2/FLU/RSV plus assay is intended as an aid in the diagnosis of influenza from Nasopharyngeal swab specimens and should not be used as a sole basis for treatment. Nasal washings and aspirates are unacceptable for Xpert Xpress SARS-CoV-2/FLU/RSV testing.  Fact Sheet for Patients: EntrepreneurPulse.com.au  Fact Sheet for Healthcare Providers: IncredibleEmployment.be  This test is not yet approved or cleared by the [place] FDA and has been authorized for detection and/or diagnosis of SARS-CoV-2 by FDA under an Emergency Use  Authorization (EUA). This EUA will remain in effect (meaning this test can be used) for the duration of the COVID-19 declaration under Section 564(b)(1) of the Act, 21 U.S.C. section 360bbb-3(b)(1), unless the authorization is terminated or revoked.  Performed at [facility], [place] [address] [place], [place] [address]      Radiology Studies: No results found.   LOS: 5 days   Antonieta Pert, MD [facility]  06/24/2021, 10:12 AM

## 2021-06-24 NOTE — Progress Notes (Signed)
Nutrition Follow-up  DOCUMENTATION CODES:  Underweight, Non-severe (moderate) malnutrition in context of chronic illness  INTERVENTION:   Recommend initiation of bowel regimen given no BM documented since 8/30  -Continue Ensure Enlive po TID, each supplement provides 350 kcal and 20 grams of protein -Continue MVI with minerals daily  Recommend transitioning pt back to regular diet given ongoing inadequate intake, fluid restriction may be continued despite diet being otherwise liberalized.   NUTRITION DIAGNOSIS:  Moderate Malnutrition related to chronic illness (dementia, COPD) as evidenced by mild fat depletion, moderate fat depletion, mild muscle depletion, moderate muscle depletion. -- ongoing  GOAL:  Patient will meet greater than or equal to 90% of their needs -- progressing  MONITOR:  PO intake, Supplement acceptance, Labs, Weight trends, Skin, I & O's  REASON FOR ASSESSMENT:  Malnutrition Screening Tool    ASSESSMENT:  Alyssa [name] is a 80 y.o. female with medical history significant of hypertension, hyperlipidemia, COPD, PVD, CKD stage stage IV, COVID-19 infection 2021, dementia presents with complaints of pain all over for the last 3 days.  Admits that a rash broke out on the left side of her abdomen and goes around to her back and states that it is very painful.  She states that she has been more short of breath and has had a productive cough with " cloudy" sputum production.  Associated symptoms include wheezing, chest pain, left hip pain, 15 pound weight loss over the last month, and generalized malaise.  Notes that she has not had an appetite.  Denies having any significant fever, nausea, or vomiting.  Patient has not tried anything to treat symptoms at home prior to coming to the hospital.  She reports quitting smoking about 1 month ago.  Records note patient has been having difficulty with shortness of breath for more than an months now  Pt admitted with herpes  zoster and dyspnea secondary to lt lung collapse.   Pt has had improved PO intake since last RD assessment with last 8 meal completions charted as 10-75% (45% average meal intake). Diet had been liberalized to regular after last RD assessment to encourage PO intake but has since been transitioned back to renal per MD. Recommend transitioning pt back to regular diet given ongoing inadequate intake, fluid restriction may be continued despite diet being otherwise liberalized. Note pt does have orders for Ensure Enlive/Plus TID and typically does well with supplement per RN.   Medications: SSI, Megace, MVI with minerals, protonix, deltasone, sodium bicarbonate, lokelma Labs: Na 133 (L), BUN 46 (H), Cr 2.98 (H) CBGs: 756-433 x24 hours  UOP: 1.4L x24 hours I/O: +4.6L since admit  Recommend initiation of bowel regimen given no BM documented since 8/30  Diet Order:   Diet Order             Diet renal with fluid restriction Fluid restriction: 1200 mL Fluid; Room service appropriate? Yes; Fluid consistency: Thin  Diet effective now                  EDUCATION NEEDS:  Education needs have been addressed  Skin:  Skin Assessment: Skin Integrity Issues: Skin Integrity Issues:: Other (Comment) Other: rash and blisters on lower abdomen  Last BM:  8/30  Height:  Ht Readings from Last 1 Encounters:  06/16/21 5\' 3"  (1.6 m)   Weight:  Wt Readings from Last 1 Encounters:  06/23/21 49.9 kg   Ideal Body Weight:  52.3 kg  BMI:  Body mass index is 19.48 kg/m.  Estimated Nutritional Needs:  Kcal:  1600-1800 Protein:  90-105 grams Fluid:  > 1.6 L    Larkin Ina, MS, RD, LDN (she/her/hers) RD pager number and weekend/on-call pager number located in [place].

## 2021-06-25 DIAGNOSIS — R0602 Shortness of breath: Secondary | ICD-10-CM | POA: Diagnosis not present

## 2021-06-25 LAB — BASIC METABOLIC PANEL
Anion gap: 13 (ref 5–15)
BUN: 50 mg/dL — ABNORMAL HIGH (ref 8–23)
CO2: 21 mmol/L — ABNORMAL LOW (ref 22–32)
Calcium: 9.3 mg/dL (ref 8.9–10.3)
Chloride: 100 mmol/L (ref 98–111)
Creatinine, Ser: 2.99 mg/dL — ABNORMAL HIGH (ref 0.44–1.00)
GFR, Estimated: 16 mL/min — ABNORMAL LOW (ref >60)
Glucose, Bld: 114 mg/dL — ABNORMAL HIGH (ref 70–99)
Potassium: 4.4 mmol/L (ref 3.5–5.1)
Sodium: 134 mmol/L — ABNORMAL LOW (ref 135–145)

## 2021-06-25 LAB — CBC
HCT: 23.2 % — ABNORMAL LOW (ref 36.0–46.0)
Hemoglobin: 7.9 g/dL — ABNORMAL LOW (ref 12.0–15.0)
MCH: 28.2 pg (ref 26.0–34.0)
MCHC: 34.1 g/dL (ref 30.0–36.0)
MCV: 82.9 fL (ref 80.0–100.0)
Platelets: 461 10*3/uL — ABNORMAL HIGH (ref 150–400)
RBC: 2.8 MIL/uL — ABNORMAL LOW (ref 3.87–5.11)
RDW: 16.1 % — ABNORMAL HIGH (ref 11.5–15.5)
WBC: 12.2 10*3/uL — ABNORMAL HIGH (ref 4.0–10.5)
nRBC: 0.8 % — ABNORMAL HIGH (ref 0.0–0.2)

## 2021-06-25 LAB — GLUCOSE, CAPILLARY
Glucose-Capillary: 127 mg/dL — ABNORMAL HIGH (ref 70–99)
Glucose-Capillary: 181 mg/dL — ABNORMAL HIGH (ref 70–99)
Glucose-Capillary: 152 mg/dL — ABNORMAL HIGH (ref 70–99)
Glucose-Capillary: 199 mg/dL — ABNORMAL HIGH (ref 70–99)

## 2021-06-25 MED ORDER — HYDRALAZINE HCL 25 MG PO TABS: 25.0000 mg | ORAL_TABLET | Freq: Four times a day (QID) | ORAL | Status: DC | PRN

## 2021-06-25 NOTE — Progress Notes (Addendum)
PROGRESS NOTE    Alyssa [name]  YKD:[id number] DOB: 01/04/1941 DOA: 06/15/2021 PCP: [name], DO   Chief Complaint  Patient presents with   Generalized Body Aches   Brief Narrative: 80 year old female with hypertension, HLD, COPD, PVD, CKD stage IV, COVID-19 infection in 2021, dementia presents with pain all over x3 days along with a rash.  She was seen in the ED creatinine 3.1, troponin 31 chest x-ray left lower lobe collapse COVID-19 negative CT of the chest abdomen pelvis showed left lower lobe collapse without visible mass or adenopathy and femoral bypass graft with increased fluid collection and bypass anastomosis measuring 4.8 X4.1 previously 2.6 X2.6 Patient was given valacyclovir IV fluids and admitted for further management for dyspnea due to left lung collapse, hypertension urgency, weight loss, borderline elevated troponin, nongap metabolic acidosis, anemia. Patient was managed with Valtrex, pain control supplemental oxygen. Seen by PT OT and skilled nursing facility recommended.  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 Continue optimize pain control.  Now able to come off oxygen.  Respiratory status is stable.  Continue PT OT ambulation.  Repeat chest x-ray in the morning   Shingles/herpes zoster viral: involving the left flank and left abdomen: Rash/crusting except 1 on the left flank which is also getting dry now.  Continue on airborne precaution.  Continue PO valacyclovir, continue pain management with Lyrica, capsicum cream, oxycodone.   Hypertension urgency: BP borderline  controlled, continue amlodipine, Coreg, clonidine.  Add as needed oral hydralazine.  CKD stage IV/metabolic  acidosis-previous creatinine around 2.6-2.9.  low-salt diet.  Continue sodium bicarb po.  Renal function creat at 2.9 improving, on ivf.  Recent Labs  Lab 06/21/21 1330 06/22/21 0229 06/23/21 0433 06/24/21 0840 06/25/21 0419  BUN 43*  43* 46* 46* 46* 50*  CREATININE 3.07*  3.13* 3.02* 3.14* 2.98* 2.99*    Hyperkalemia on Lokelma - will stop  today as potassium stable  PVD/CT showing fluid collection at the bypass anastomosis patient is symptomatic and is on Plavix aspirin and statin,  has had previous collection , she will need to follow-up with her vascular  History of tobacco abuse-has quit few months ago Moderate protein calorie malnutrition: Augment diet as tolerated Dementia:Cont fall precaution, aspiration precaution.  Continue delirium precaution.  Deconditioning: PT OT and will need SNF  HLD on statin Anemia of chronic kidney disease monitor hemoglobin.  So far stable Recent Labs  Lab 06/19/21 0539 06/21/21 1330 06/24/21 0840 06/25/21 0419  HGB 8.2* 8.6* 8.4* 7.9*  HCT 24.3* 25.8* 24.7* 23.2*     Depression with anxiety-mood stable  Diet Order             Diet renal with fluid restriction Fluid restriction: 1200 mL Fluid; Room service appropriate? Yes; Fluid consistency: Thin  Diet effective now                   Nutrition Problem: Moderate Malnutrition Etiology: chronic illness (dementia, COPD) Signs/Symptoms: mild fat depletion, moderate fat depletion, mild muscle depletion, moderate muscle depletion Interventions: Ensure Enlive (each supplement provides 350kcal and 20 grams of protein), MVI, Liberalize Diet Patient's Body mass index is  19.48 kg/m. DVT prophylaxis: heparin injection 5,000 Units Start: 06/16/21 0930 Code Status:   Code Status: DNR  Family Communication: plan of care discussed with patient at bedside. Status is:  Inpatient  Remains inpatient appropriate because:Inpatient level of care appropriate due to severity of illness  Dispo: The patient is from: Home              Anticipated d/c is to: SNF in 1-2 days              Patient currently is not medically stable to d/c.   Difficult to place patient No  Unresulted Labs (From admission, onward)     Start     Ordered   06/25/21 6269  Basic metabolic panel  Daily,   R     Question:  Specimen collection method  Answer:  Lab=Lab collect   06/24/21 1317   06/25/21 0500  CBC  Daily,   R     Question:  Specimen collection method  Answer:  Lab=Lab collect   06/24/21 1317           Medications reviewed:  Scheduled Meds:  acetaminophen  1,000 mg Oral TID   amLODipine  10 mg Oral QHS   arformoterol  15 mcg Nebulization BID   And   budesonide (PULMICORT) nebulizer solution  0.25 mg Nebulization BID   And   revefenacin  175 mcg Nebulization Daily   aspirin EC  81 mg Oral Daily   capsicum   Topical TID   carvedilol  12.5 mg Oral BID   cloNIDine  0.2 mg Oral QHS   clopidogrel  75 mg Oral Daily   donepezil  5 mg Oral Q2200   DULoxetine  30 mg Oral Daily   feeding supplement  237 mL Oral TID BM   heparin  5,000 Units Subcutaneous Q8H   insulin aspart  0-5 Units Subcutaneous QHS   insulin aspart  0-6 Units Subcutaneous TID WC   megestrol  40 mg Oral BID   multivitamin with minerals  1 tablet Oral Daily   pantoprazole  40 mg Oral Daily   predniSONE  20 mg Oral Q breakfast   pregabalin  75 mg Oral Daily   rosuvastatin  40 mg Oral Daily   sodium bicarbonate  650 mg Oral BID   sodium chloride flush  3 mL Intravenous Q12H   Continuous Infusions:  lactated ringers 50 mL/hr at 06/24/21 1823   Consultants:see note  Procedures:see note Antimicrobials: Anti-infectives (From admission, onward)    Start     Dose/Rate Route Frequency Ordered Stop   06/17/21 0800  valACYclovir (VALTREX) tablet 1,000 mg        1,000 mg Oral Daily 06/16/21 0839  06/22/21 0900   06/16/21 0515  valACYclovir (VALTREX) tablet 1,000 mg        1,000 mg Oral STAT 06/16/21 0502 06/16/21 0543      Culture/Microbiology    Component Value Date/Time   SDES URINE, RANDOM 09/29/2020 2353   SPECREQUEST  09/29/2020 2353    NONE Performed at [facility], [address] [place], [place] [address]    CULT 20,000 COLONIES/mL ENTEROBACTER AEROGENES (A) 09/29/2020 2353   REPTSTATUS 10/01/2020 FINAL 09/29/2020 2353    Other culture-see note  Objective: Vitals: Today's Vitals   06/25/21 0508 06/25/21 0822 06/25/21 0945 06/25/21 1033  BP: (!) 167/78     Pulse: 78     Resp: 18     Temp: 99 F (37.2 C)  TempSrc: Oral     SpO2: 95% 95%    Weight:      Height:      PainSc:   9  Asleep    Intake/Output Summary (Last 24 hours) at 06/25/2021 1132 Last data filed at 06/25/2021 0557 Gross per 24 hour  Intake 1658.33 ml  Output 600 ml  Net 1058.33 ml    Filed Weights   06/16/21 2229 06/19/21 0519 06/23/21 2137  Weight: 45 kg 49.9 kg 49.9 kg   Weight change:   Intake/Output from previous day: 09/07 0701 - 09/08 0700 In: 1858.3 [P.O.:640; I.V.:1218.3] Out: 600 [Urine:600] Intake/Output this shift: No intake/output data recorded. Filed Weights   06/16/21 2229 06/19/21 0519 06/23/21 2137  Weight: 45 kg 49.9 kg 49.9 kg   Examination:  General exam: AAO at baseline, on room air, in pain, not in distress.   HEENT:Oral mucosa moist, Ear/Nose WNL grossly, dentition normal. Respiratory system: bilaterally diminished breath sound at the lung base, no use of accessory muscle Cardiovascular system: S1 & S2 +, No JVD,. Gastrointestinal system: Abdomen soft,NT,ND, BS+ Nervous System:Alert, awake, moving extremities and grossly nonfocal Extremities: no edema, distal peripheral pulses palpable.  Skin: No rashes,no icterus.  Just a rash present on the left abdomen-crusted and also present in the left flank with mild oozing MSK: Normal muscle bulk,tone,  power  Data Reviewed: I have personally reviewed following labs and imaging studies CBC: Recent Labs  Lab 06/19/21 0539 06/21/21 1330 06/24/21 0840 06/25/21 0419  WBC 11.9* 7.6 11.7* 12.2*  NEUTROABS 10.2* 5.9  --   --   HGB 8.2* 8.6* 8.4* 7.9*  HCT 24.3* 25.8* 24.7* 23.2*  MCV 83.8 84.3 83.4 82.9  PLT 308 428* 438* 461*    Basic Metabolic Panel: Recent Labs  Lab 06/21/21 1330 06/22/21 0229 06/23/21 0433 06/24/21 0840 06/25/21 0419  NA 134*  132* 135 133* 133* 134*  K 6.3*  6.3* 4.4 4.0 4.4 4.4  CL 102  103 103 96* 100 100  CO2 19*  20* 24 26 22  21*  GLUCOSE 256*  256* 150* 120* 166* 114*  BUN 43*  43* 46* 46* 46* 50*  CREATININE 3.07*  3.13* 3.02* 3.14* 2.98* 2.99*  CALCIUM 9.1  9.1 8.5* 8.6* 8.8* 9.3  PHOS 3.3 3.3  --   --   --     GFR: Estimated Creatinine Clearance: 12.4 mL/min (A) (by C-G formula based on SCr of 2.99 mg/dL (H)). Liver Function Tests: Recent Labs  Lab 06/21/21 1330 06/22/21 0229  ALBUMIN 2.9* 2.5*    No results for input(s): LIPASE, AMYLASE in the last 168 hours. No results for input(s): AMMONIA in the last 168 hours. Coagulation Profile: No results for input(s): INR, PROTIME in the last 168 hours. Cardiac Enzymes: No results for input(s): CKTOTAL, CKMB, CKMBINDEX, TROPONINI in the last 168 hours. BNP (last 3 results) No results for input(s): PROBNP in the last 8760 hours. HbA1C: No results for input(s): HGBA1C in the last 72 hours.  CBG: Recent Labs  Lab 06/24/21 0653 06/24/21 1142 06/24/21 1714 06/24/21 2210 06/25/21 0843  GLUCAP 126* 218* 168* 163* 127*    Lipid Profile: No results for input(s): CHOL, HDL, LDLCALC, TRIG, CHOLHDL, LDLDIRECT in the last 72 hours. Thyroid Function Tests: No results for input(s): TSH, T4TOTAL, FREET4, T3FREE, THYROIDAB in the last 72 hours. Anemia Panel: No results for input(s): VITAMINB12, FOLATE, FERRITIN, TIBC, IRON, RETICCTPCT in the last 72 hours. Sepsis Labs: No results for  input(s): PROCALCITON, LATICACIDVEN in  the last 168 hours.  Recent Results (from the past 240 hour(s))  Resp Panel by RT-PCR (Flu A&B, Covid) Nasopharyngeal Swab     Status: None   Collection Time: 06/15/21  5:44 PM   Specimen: Nasopharyngeal Swab; Nasopharyngeal(NP) swabs in vial transport medium  Result Value Ref Range Status   SARS Coronavirus 2 by RT PCR NEGATIVE NEGATIVE Final    Comment: (NOTE) SARS-CoV-2 target nucleic acids are NOT DETECTED.  The SARS-CoV-2 RNA is generally detectable in upper respiratory specimens during the acute phase of infection. The lowest concentration of SARS-CoV-2 viral copies this assay can detect is 138 copies/mL. A negative result does not preclude SARS-Cov-2 infection and should not be used as the sole basis for treatment or other patient management decisions. A negative result may occur with  improper specimen collection/handling, submission of specimen other than nasopharyngeal swab, presence of viral mutation(s) within the areas targeted by this assay, and inadequate number of viral copies(<138 copies/mL). A negative result must be combined with clinical observations, patient history, and epidemiological information. The expected result is Negative.  Fact Sheet for Patients:  EntrepreneurPulse.com.au  Fact Sheet for Healthcare Providers:  IncredibleEmployment.be  This test is no t yet approved or cleared by the [place] FDA and  has been authorized for detection and/or diagnosis of SARS-CoV-2 by FDA under an Emergency Use Authorization (EUA). This EUA will remain  in effect (meaning this test can be used) for the duration of the COVID-19 declaration under Section 564(b)(1) of the Act, 21 U.S.C.section 360bbb-3(b)(1), unless the authorization is terminated  or revoked sooner.       Influenza A by PCR NEGATIVE NEGATIVE Final   Influenza B by PCR NEGATIVE NEGATIVE Final    Comment: (NOTE) The  Xpert Xpress SARS-CoV-2/FLU/RSV plus assay is intended as an aid in the diagnosis of influenza from Nasopharyngeal swab specimens and should not be used as a sole basis for treatment. Nasal washings and aspirates are unacceptable for Xpert Xpress SARS-CoV-2/FLU/RSV testing.  Fact Sheet for Patients: EntrepreneurPulse.com.au  Fact Sheet for Healthcare Providers: IncredibleEmployment.be  This test is not yet approved or cleared by the [place] FDA and has been authorized for detection and/or diagnosis of SARS-CoV-2 by FDA under an Emergency Use Authorization (EUA). This EUA will remain in effect (meaning this test can be used) for the duration of the COVID-19 declaration under Section 564(b)(1) of the Act, 21 U.S.C. section 360bbb-3(b)(1), unless the authorization is terminated or revoked.  Performed at [facility], [address] [place], [place] [address]       Radiology Studies: No results found.   LOS: 6 days   Antonieta Pert, MD [facility]  06/25/2021, 11:32 AM

## 2021-06-25 NOTE — Progress Notes (Signed)
Occupational Therapy Treatment Patient Details Name: Alyssa [name] MRN: [id number] DOB: 08-Mar-1941 Today's Date: 06/25/2021    History of present illness Pt is a 80 y/o female admitted 8/29  secondary to worsening shortness of breath, rash in the left flank/abdomen that progressed to the back, productive cough and weight loss. Found to have shingles, hypoxic respiratory failure and hypertensive urgency. PMH inclues PVD, COPD, HTN, and covid.   OT comments  Patient continues to be limited by pain associated with her shingles.  Nursing in for am meds.  Deficits impacting independence are listed below, pain is the primary issue.  OT to continue efforts in the acute setting to maximize her functional status, but SNF post acute is recommended prior to returning home.    Follow Up Recommendations  SNF    Equipment Recommendations  3 in 1 bedside commode;Wheelchair (measurements OT);Wheelchair cushion (measurements OT)    Recommendations for Other Services      Precautions / Restrictions Precautions Precautions: Fall;Other (comment) Precaution Comments: airborn precautions for shingles Restrictions Weight Bearing Restrictions: No       Mobility Bed Mobility Overal bed mobility: Needs Assistance Bed Mobility: Supine to Sit;Sit to Supine     Supine to sit: Min assist;HOB elevated Sit to supine: Mod assist   General bed mobility comments: VC's for hand placement for sitting, and Mod A to lie down - bring feet back onto the bed. Patient Response: Flat affect  Transfers                      Balance Overall balance assessment: Needs assistance Sitting-balance support: Bilateral upper extremity supported;Feet supported Sitting balance-Leahy Scale: Fair                                     ADL either performed or assessed with clinical judgement   ADL       Grooming: Wash/dry hands;Sitting;Min guard               Lower Body Dressing:  Moderate assistance;Sitting/lateral leans Lower Body Dressing Details (indicate cue type and reason): assist to adjust socks seated.                                       Cognition Arousal/Alertness: Awake/alert Behavior During Therapy: WFL for tasks assessed/performed                                   General Comments: Appropriate. Following commands.                          Pertinent Vitals/ Pain       Pain Assessment: Faces Faces Pain Scale: Hurts whole lot Pain Location: back Pain Descriptors / Indicators: Grimacing;Guarding;Aching;Discomfort                                                          Frequency  Min 2X/week        Progress Toward Goals  OT Goals(current goals can now be found in the care plan  section)  Progress towards OT goals: Progressing toward goals  Acute Rehab OT Goals Patient Stated Goal: feel better OT Goal Formulation: With patient Time For Goal Achievement: 07/04/21 Potential to Achieve Goals: [place]  Plan Discharge plan remains appropriate    Co-evaluation                 AM-PAC OT "6 Clicks" Daily Activity     Outcome Measure   Help from another person eating meals?: None Help from another person taking care of personal grooming?: A Little Help from another person toileting, which includes using toliet, bedpan, or urinal?: A Lot Help from another person bathing (including washing, rinsing, drying)?: A Lot Help from another person to put on and taking off regular upper body clothing?: A Little Help from another person to put on and taking off regular lower body clothing?: A Lot 6 Click Score: 16    End of Session    OT Visit Diagnosis: Unsteadiness on feet (R26.81);Muscle weakness (generalized) (M62.81);Pain Pain - Right/Left:  (back)   Activity Tolerance Patient limited by pain   Patient Left in bed;with call bell/phone within reach;with bed alarm set    Nurse Communication          Time: [phone] OT Time Calculation (min): 16 min  Charges: OT General Charges $OT Visit: 1 Visit OT Treatments $Self Care/Home Management : 8-22 mins  06/25/2021  RP, OTR/L  Acute Rehabilitation Services  Office:  [phone]    Metta Clines 06/25/2021, 10:23 AM

## 2021-06-26 ENCOUNTER — Inpatient Hospital Stay (HOSPITAL_COMMUNITY): Payer: Medicare HMO

## 2021-06-26 DIAGNOSIS — R0602 Shortness of breath: Secondary | ICD-10-CM | POA: Diagnosis not present

## 2021-06-26 LAB — CBC
HCT: 24.1 % — ABNORMAL LOW (ref 36.0–46.0)
Hemoglobin: 8.4 g/dL — ABNORMAL LOW (ref 12.0–15.0)
MCH: 28.8 pg (ref 26.0–34.0)
MCHC: 34.9 g/dL (ref 30.0–36.0)
MCV: 82.5 fL (ref 80.0–100.0)
Platelets: 487 10*3/uL — ABNORMAL HIGH (ref 150–400)
RBC: 2.92 MIL/uL — ABNORMAL LOW (ref 3.87–5.11)
RDW: 16.9 % — ABNORMAL HIGH (ref 11.5–15.5)
WBC: 12.8 10*3/uL — ABNORMAL HIGH (ref 4.0–10.5)
nRBC: 0.3 % — ABNORMAL HIGH (ref 0.0–0.2)

## 2021-06-26 LAB — GLUCOSE, CAPILLARY
Glucose-Capillary: 98 mg/dL (ref 70–99)
Glucose-Capillary: 163 mg/dL — ABNORMAL HIGH (ref 70–99)
Glucose-Capillary: 141 mg/dL — ABNORMAL HIGH (ref 70–99)
Glucose-Capillary: 192 mg/dL — ABNORMAL HIGH (ref 70–99)

## 2021-06-26 LAB — BASIC METABOLIC PANEL
Anion gap: 8 (ref 5–15)
BUN: 50 mg/dL — ABNORMAL HIGH (ref 8–23)
CO2: 22 mmol/L (ref 22–32)
Calcium: 8.9 mg/dL (ref 8.9–10.3)
Chloride: 106 mmol/L (ref 98–111)
Creatinine, Ser: 2.73 mg/dL — ABNORMAL HIGH (ref 0.44–1.00)
GFR, Estimated: 17 mL/min — ABNORMAL LOW (ref >60)
Glucose, Bld: 103 mg/dL — ABNORMAL HIGH (ref 70–99)
Potassium: 4.5 mmol/L (ref 3.5–5.1)
Sodium: 136 mmol/L (ref 135–145)

## 2021-06-26 MED ORDER — PREDNISONE 10 MG PO TABS
10.0000 mg | ORAL_TABLET | Freq: Every day | ORAL | Status: DC
  Administered 2021-06-27 – 2021-07-03 (×7): 10 mg via ORAL
  Filled 2021-06-26 (×7): qty 1

## 2021-06-26 NOTE — TOC Progression Note (Addendum)
Transition of Care [facility]) - Progression Note    Patient Details  Name: KELLYJO EDGREN MRN: [id number] Date of Birth: 04-02-1941  Transition of [facility]) CM/SW Contact  [name], LCSW Phone Number: 06/26/2021, 6:47 PM  Clinical Narrative:  Talked with patient by phone (6:35 pm) to get her SNF decision. Patient advised that per MD, she is medically ready for discharge. Patient chose [facility]. Contacted Juliann Pulse with [facility] regarding patient and they can take her on Monday. They can have a COVID test with 48 hours of admission. Contacted patient and update provided.   Handoff left for weekend CSW regarding initiating authorization with [employer] - went into [facility] and patient is managed by [employer].     Expected Discharge Plan: [place] Barriers to Discharge: Continued Medical Work up  Expected Discharge Plan and Services Expected Discharge Plan: [place] In-house Referral: Clinical Social Work     Living arrangements for the past 2 months: Single Family Home                                       Social Determinants of Health (SDOH) Interventions  No SDOH interventions requested or needed at this time.  Readmission Risk Interventions Readmission Risk Prevention Plan 07/10/2020 12/26/2018  Transportation Screening Complete Complete  Medication Review Press photographer) Complete Complete  PCP or Specialist appointment within 3-5 days of discharge - Not Complete  PCP/Specialist Appt Not Complete comments - pt for snf. Snf MD to see as needed.  [place] or Home Care Consult Complete Not Complete  HRI or Home Care Consult Pt Refusal Comments - NA  SW Recovery Care/Counseling Consult Complete Not Complete  SW Consult Not Complete Comments - NA  Palliative Care Screening Not Applicable Complete  Skilled Nursing Facility Not Applicable Complete  Some recent data might be hidden

## 2021-06-26 NOTE — Plan of Care (Signed)
  Problem: Nutrition: Goal: Adequate nutrition will be maintained Outcome: Progressing   Problem: Elimination: Goal: Will not experience complications related to urinary retention Outcome: Progressing   

## 2021-06-26 NOTE — Progress Notes (Signed)
PROGRESS NOTE    Alyssa [name]  IFO:[id number] DOB: 04-19-1941 DOA: 06/15/2021 PCP: [name], DO   Chief Complaint  Patient presents with   Generalized Body Aches   Brief Narrative: 80 year old female with hypertension, HLD, COPD, PVD, CKD stage IV, COVID-19 infection in 2021, dementia presents with pain all over x3 days along with a rash.  She was seen in the ED creatinine 3.1, troponin 31 chest x-ray left lower lobe collapse COVID-19 negative CT of the chest abdomen pelvis showed left lower lobe collapse without visible mass or adenopathy and femoral bypass graft with increased fluid collection and bypass anastomosis measuring 4.8 X4.1 previously 2.6 X2.6 Patient was given valacyclovir IV fluids and admitted for further management for dyspnea due to left lung collapse, hypertension urgency, weight loss, borderline elevated troponin, nongap metabolic acidosis, anemia. Patient was managed with Valtrex, pain control supplemental oxygen. Seen by PT OT and skilled nursing facility recommended.  Subjective: Seen this morning.  Patient is mildly sleepy but able to wake up and talk, IV access was established again at bedside. Overnight placed on 2 L nasal cannula.  This morning able to cough oxygen saturating 94 to 96%.  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 Continue optimize pain control.  May need supplemental oxygen during night, continue bronchodilators, PT OT, incentive spirometry .  Repeat chest x-ray shows persistent volume loss left lung base slightly improved aeration present within the left lower lobe question left pleural fluid . Pt was seen by pulm for same and they are  planning to repeat CT chest and outpatient follow-up in a month-if still not reexpanded then may need inspection bronchoscopy.  Patient is also on prednisone started 9/1 will taper off.  Acute Shingles/herpes zoster viral: involving the left flank and left abdomen: Rash/crusting except 1 on the left flank  and now almost crusting.Continue on contact/airborne precaution. Cont on PO valacyclovir, continue pain management with Lyrica, capsicum cream, oxycodone. On prednisone too, start taper.   Hypertension urgency: BP controlled currently.  Continue controlled. continue amlodipine, Coreg, clonidine.  Add as needed oral hydralazine.  CKD stage IV/metabolic  acidosis-previous creatinine around 2.6-2.9.  Valtrex.  Creatinine improved to 2.7. On gentle IV fluid while on Valtrex.  Encourage oral intake.  Recent Labs  Lab 06/22/21 0229 06/23/21 0433 06/24/21 0840 06/25/21 0419 06/26/21 0630  BUN 46* 46* 46* 50* 50*  CREATININE 3.02* 3.14* 2.98* 2.99* 2.73*    Hyperkalemia on Lokelma -Lokelma restarted.  Potassium is stable.  Monitor Recent Labs  Lab 06/22/21 0229 06/23/21 0433 06/24/21 0840 06/25/21 0419 06/26/21 0630  K 4.4 4.0 4.4 4.4 4.5    PVD- FEM FEM BYPASS 2011/CT showing fluid collection at the bypass anastomosis- CT on admission reported as -"Interval increase in the size of the fluid collection or pseudoaneurysm in the bypass anastomosis in the right groin" She is on Plavix aspirin and statin,. I will dw vascualr while she is here.  Mild leukocytosis at 11 to 12 g.  Watch and wbc trending up. She is afebrile.  She is on prednisone which could contribute.  Continue wound care. Recent Labs  Lab 06/21/21 1330 06/24/21 0840 06/25/21 0419 06/26/21 0630  WBC 7.6 11.7* 12.2* 12.8*  History of tobacco abuse-has quit few months ago Moderate protein calorie malnutrition: Augment diet as tolerated Dementia: Alert awake but not much communicative, able to express pain here. Cont fall  precaution, aspiration precaution.  Continue delirium precaution.  Deconditioning: cont PT OT and will need SNF  HLD Cont statin Anemia of chronic kidney disease monitor hemoglobin.-Stable. Recent Labs  Lab 06/21/21 1330 06/24/21 0840 06/25/21 0419 06/26/21 0630  HGB 8.6* 8.4* 7.9* 8.4*  HCT 25.8* 24.7* 23.2* 24.1*   Depression with anxiety-mood stable  Diet Order             Diet renal with fluid restriction Fluid restriction: 1200 mL Fluid; Room service appropriate? Yes; Fluid consistency: Thin  Diet effective now                   Nutrition Problem: Moderate Malnutrition Etiology: chronic illness (dementia, COPD) Signs/Symptoms: mild fat depletion, moderate fat depletion, mild muscle depletion, moderate muscle depletion Interventions: Ensure Enlive (each supplement provides 350kcal and 20 grams of protein), MVI, Liberalize Diet Patient's Body mass index is 19.48 kg/m. DVT prophylaxis: heparin injection 5,000 Units Start: 06/16/21 0930 Code Status:   Code Status: DNR  Family Communication: plan of care discussed with patient at bedside. Status is: Inpatient  Remains inpatient appropriate because:Inpatient level of care appropriate due to severity of illness  Dispo: The patient is from: Home              Anticipated d/c is to: SNF  once approved              Patient currently is not medically stable to d/c.   Difficult to place patient No  Unresulted Labs (From admission, onward)     Start     Ordered   06/25/21 1751  Basic metabolic panel  Daily,   R     Question:  Specimen collection method  Answer:  Lab=Lab collect   06/24/21 1317   06/25/21 0500  CBC  Daily,   R     Question:  Specimen collection method  Answer:  Lab=Lab collect   06/24/21 1317           Medications reviewed:  Scheduled Meds:  acetaminophen  1,000 mg Oral TID   amLODipine  10 mg Oral QHS   arformoterol  15 mcg Nebulization BID   And   budesonide (PULMICORT) nebulizer solution   0.25 mg Nebulization BID   And   revefenacin  175 mcg Nebulization Daily   aspirin EC  81 mg Oral Daily   capsicum   Topical TID   carvedilol  12.5 mg Oral BID   cloNIDine  0.2 mg Oral QHS   clopidogrel  75 mg Oral Daily   donepezil  5 mg Oral Q2200   DULoxetine  30 mg Oral Daily   feeding supplement  237 mL Oral TID BM   heparin  5,000 Units Subcutaneous Q8H   insulin aspart  0-5 Units Subcutaneous QHS   insulin aspart  0-6 Units Subcutaneous TID WC   megestrol  40 mg Oral BID   multivitamin with minerals  1 tablet Oral Daily   pantoprazole  40 mg Oral Daily   predniSONE  20 mg Oral Q breakfast   pregabalin  75 mg Oral Daily   rosuvastatin  40 mg Oral Daily   sodium bicarbonate  650 mg Oral BID   sodium chloride flush  3 mL Intravenous Q12H   Continuous Infusions:  lactated ringers 50 mL/hr at 06/25/21  1546   Consultants:see note  Procedures:see note Antimicrobials: Anti-infectives (From admission, onward)    Start     Dose/Rate Route Frequency Ordered Stop   06/17/21 0800  valACYclovir (VALTREX) tablet 1,000 mg        1,000 mg Oral Daily 06/16/21 0839 06/22/21 0900   06/16/21 0515  valACYclovir (VALTREX) tablet 1,000 mg        1,000 mg Oral STAT 06/16/21 0502 06/16/21 0543      Culture/Microbiology    Component Value Date/Time   SDES URINE, RANDOM 09/29/2020 2353   SPECREQUEST  09/29/2020 2353    NONE Performed at [facility], [place] [address], [place], [place] [address]    CULT 20,000 COLONIES/mL ENTEROBACTER AEROGENES (A) 09/29/2020 2353   REPTSTATUS 10/01/2020 FINAL 09/29/2020 2353    Other culture-see note  Objective: Vitals: Today's Vitals   06/25/21 2225 06/26/21 0604 06/26/21 0617 06/26/21 0843  BP:  (!) 139/95    Pulse:  63    Resp:  20    Temp:  98.1 F (36.7 C)    TempSrc:  Oral    SpO2:  93% 96% 97%  Weight:      Height:      PainSc: Asleep       Intake/Output Summary (Last 24 hours) at 06/26/2021 0942 Last data filed at 06/26/2021  0600 Gross per 24 hour  Intake 1123.17 ml  Output 2150 ml  Net -1026.83 ml    Filed Weights   06/19/21 0519 06/23/21 2137 06/25/21 2139  Weight: 49.9 kg 49.9 kg 49.9 kg   Weight change:   Intake/Output from previous day: 09/08 0701 - 09/09 0700 In: 1123.2 [P.O.:294; I.V.:829.2] Out: 2150 [Urine:2150] Intake/Output this shift: No intake/output data recorded. Filed Weights   06/19/21 0519 06/23/21 2137 06/25/21 2139  Weight: 49.9 kg 49.9 kg 49.9 kg   Examination: General exam: AAOx mildly sleepy, not in distress, elderly HEENT:Oral mucosa moist, Ear/Nose WNL grossly, dentition normal. Respiratory system: bilaterally air entry present but diminished at the left base, no use of accessory muscle Cardiovascular system: S1 & S2 +, No JVD,. Gastrointestinal system: Abdomen soft, NT,ND, BS+ Nervous System:Alert, awake, moving extremities and grossly nonfocal Extremities: no edema, distal peripheral pulses palpable.  Skin: No rashes,no icterus.  Cross stated zoster rash on the left abdomen and on the back healing/crusting rash no more oozing MSK: Normal muscle bulk,tone, power.   Data Reviewed: I have personally reviewed following labs and imaging studies CBC: Recent Labs  Lab 06/21/21 1330 06/24/21 0840 06/25/21 0419 06/26/21 0630  WBC 7.6 11.7* 12.2* 12.8*  NEUTROABS 5.9  --   --   --   HGB 8.6* 8.4* 7.9* 8.4*  HCT 25.8* 24.7* 23.2* 24.1*  MCV 84.3 83.4 82.9 82.5  PLT 428* 438* 461* 487*    Basic Metabolic Panel: Recent Labs  Lab 06/21/21 1330 06/22/21 0229 06/23/21 0433 06/24/21 0840 06/25/21 0419 06/26/21 0630  NA 134*  132* 135 133* 133* 134* 136  K 6.3*  6.3* 4.4 4.0 4.4 4.4 4.5  CL 102  103 103 96* 100 100 106  CO2 19*  20* 24 26 22  21* 22  GLUCOSE 256*  256* 150* 120* 166* 114* 103*  BUN 43*  43* 46* 46* 46* 50* 50*  CREATININE 3.07*  3.13* 3.02* 3.14* 2.98* 2.99* 2.73*  CALCIUM 9.1  9.1 8.5* 8.6* 8.8* 9.3 8.9  PHOS 3.3 3.3  --   --   --   --  GFR: Estimated Creatinine Clearance: 13.6 mL/min (A) (by C-G formula based on SCr of 2.73 mg/dL (H)). Liver Function Tests: Recent Labs  Lab 06/21/21 1330 06/22/21 0229  ALBUMIN 2.9* 2.5*    No results for input(s): LIPASE, AMYLASE in the last 168 hours. No results for input(s): AMMONIA in the last 168 hours. Coagulation Profile: No results for input(s): INR, PROTIME in the last 168 hours. Cardiac Enzymes: No results for input(s): CKTOTAL, CKMB, CKMBINDEX, TROPONINI in the last 168 hours. BNP (last 3 results) No results for input(s): PROBNP in the last 8760 hours. HbA1C: No results for input(s): HGBA1C in the last 72 hours.  CBG: Recent Labs  Lab 06/25/21 0843 06/25/21 1136 06/25/21 1712 06/25/21 2218 06/26/21 0640  GLUCAP 127* 181* 152* 199* 98    Lipid Profile: No results for input(s): CHOL, HDL, LDLCALC, TRIG, CHOLHDL, LDLDIRECT in the last 72 hours. Thyroid Function Tests: No results for input(s): TSH, T4TOTAL, FREET4, T3FREE, THYROIDAB in the last 72 hours. Anemia Panel: No results for input(s): VITAMINB12, FOLATE, FERRITIN, TIBC, IRON, RETICCTPCT in the last 72 hours. Sepsis Labs: No results for input(s): PROCALCITON, LATICACIDVEN in the last 168 hours.  No results found for this or any previous visit (from the past 240 hour(s)).    Radiology Studies: DG Chest Port 1 View  Result Date: 06/26/2021 CLINICAL DATA:  Collapse of left lung. EXAM: PORTABLE CHEST 1 VIEW COMPARISON:  Chest radiograph 06/15/2021 chest CT 06/16/2021 FINDINGS: Single view of the chest again demonstrates volume loss at the left lung base. Increased aeration or air bronchograms at the left lung base. Slightly coarse lung markings compatible with underlying emphysema. Heart size is grossly stable. Atherosclerotic calcifications at the aortic arch. Again noted is elevation of the right humeral head suggestive for underlying rotator cuff disease. Difficult to exclude left pleural fluid.  IMPRESSION: Persistent volume loss at left lung base. However, there appears to be slightly improved aeration, presumably in the left lower lobe. Question left pleural fluid. Electronically Signed   By: Markus Daft M.D.   On: 06/26/2021 07:43     LOS: 7 days   Antonieta Pert, MD [facility]  06/26/2021, 9:42 AM

## 2021-06-26 NOTE — Progress Notes (Signed)
Physical Therapy Treatment Patient Details Name: Alyssa [name] MRN: [id number] DOB: May 31, 1941 Today's Date: 06/26/2021    History of Present Illness 80 y/o female admitted 8/29  secondary to worsening shortness of breath, rash in the left flank/abdomen that progressed to the back, productive cough and weight loss. Found to have shingles, hypoxic respiratory failure and hypertensive urgency. PMH inclues PVD, COPD, HTN, and covid.    PT Comments    Pt was seen for mobility only tolerated from side of bed. Her pain from shingles and her O2 sats dropping with supplemental O2 are the limits of her abiltiy.  The sats dropping are a major source of her fatigue with this effort, and will recommend continuing dc plan with SNF to restore safe movement.  Pt is willing to work, very motivated, but is quite weak and in pain for any effort.  Focus on OOB to chair, standing endurance and safety with all transfers.   Follow Up Recommendations  SNF     Equipment Recommendations  None recommended by PT    Recommendations for Other Services       Precautions / Restrictions Precautions Precautions: Fall Precaution Comments: airborn precautions for shingles Restrictions Weight Bearing Restrictions: No Other Position/Activity Restrictions: purwick    Mobility  Bed Mobility Overal bed mobility: Needs Assistance Bed Mobility: Supine to Sit;Sit to Supine Rolling: Min guard   Supine to sit: Min assist Sit to supine: Mod assist   General bed mobility comments: more help to return to bed due to her pain on shingles and fatigue, O2 sat drops with standing    Transfers Overall transfer level: Needs assistance Equipment used: Rolling walker (2 wheeled) Transfers: Sit to/from Stand Sit to Stand: Min assist         General transfer comment: min assist at walker but requires heavy supervision of her sats which decline quickly  Ambulation/Gait             General Gait Details: unable  to tolerate   Stairs             Wheelchair Mobility    Modified Rankin (Stroke Patients Only)       Balance Overall balance assessment: Needs assistance Sitting-balance support: Feet supported;Bilateral upper extremity supported Sitting balance-Leahy Scale: Fair   Postural control: Posterior lean Standing balance support: Bilateral upper extremity supported;During functional activity Standing balance-Leahy Scale: Poor Standing balance comment: help to balance and requires close monitoring of sats                            Cognition Arousal/Alertness: Awake/alert Behavior During Therapy: WFL for tasks assessed/performed Overall Cognitive Status: No family/caregiver present to determine baseline cognitive functioning                                 General Comments: slow to respond to questions      Exercises General Exercises - Lower Extremity Ankle Circles/Pumps: AROM;5 reps    General Comments General comments (skin integrity, edema, etc.): using O2 with pt demonstrating drop to 85% first standing bout, then 84% second bout.  Inconsistent with drops in supine as well      Pertinent Vitals/Pain Pain Assessment: 0-10 Pain Score: 8  Pain Location: back Pain Descriptors / Indicators: Grimacing;Guarding Pain Intervention(s): Limited activity within patient's tolerance;Monitored during session;Repositioned    Home Living  Prior Function            PT Goals (current goals can now be found in the care plan section) Acute Rehab PT Goals Patient Stated Goal: feel better    Frequency    Min 2X/week      PT Plan Current plan remains appropriate    Co-evaluation              AM-PAC PT "6 Clicks" Mobility   Outcome Measure  Help needed turning from your back to your side while in a flat bed without using bedrails?: A Little Help needed moving from lying on your back to sitting on the side  of a flat bed without using bedrails?: A Little Help needed moving to and from a bed to a chair (including a wheelchair)?: A Lot Help needed standing up from a chair using your arms (e.g., wheelchair or bedside chair)?: A Little Help needed to walk in hospital room?: Total Help needed climbing 3-5 steps with a railing? : Total 6 Click Score: 13    End of Session Equipment Utilized During Treatment: Gait belt;Oxygen Activity Tolerance: Treatment limited secondary to medical complications (Comment) Patient left: in bed;with call bell/phone within reach;with bed alarm set Nurse Communication: Mobility status PT Visit Diagnosis: Unsteadiness on feet (R26.81) Pain - part of body:  (back)     Time: 0037-0488 PT Time Calculation (min) (ACUTE ONLY): 22 min  Charges:  $Therapeutic Activity: 8-22 mins                 [name] 06/26/2021, 5:07 PM  Mee Hives, PT MS Acute Rehab Dept. Number: [place] [facility] [place]

## 2021-06-27 DIAGNOSIS — R0602 Shortness of breath: Secondary | ICD-10-CM | POA: Diagnosis not present

## 2021-06-27 LAB — CBC
HCT: 22.9 % — ABNORMAL LOW (ref 36.0–46.0)
Hemoglobin: 7.7 g/dL — ABNORMAL LOW (ref 12.0–15.0)
MCH: 28.3 pg (ref 26.0–34.0)
MCHC: 33.6 g/dL (ref 30.0–36.0)
MCV: 84.2 fL (ref 80.0–100.0)
Platelets: 445 10*3/uL — ABNORMAL HIGH (ref 150–400)
RBC: 2.72 MIL/uL — ABNORMAL LOW (ref 3.87–5.11)
RDW: 17.3 % — ABNORMAL HIGH (ref 11.5–15.5)
WBC: 12.4 10*3/uL — ABNORMAL HIGH (ref 4.0–10.5)
nRBC: 0.2 % (ref 0.0–0.2)

## 2021-06-27 LAB — GLUCOSE, CAPILLARY
Glucose-Capillary: 123 mg/dL — ABNORMAL HIGH (ref 70–99)
Glucose-Capillary: 173 mg/dL — ABNORMAL HIGH (ref 70–99)
Glucose-Capillary: 187 mg/dL — ABNORMAL HIGH (ref 70–99)
Glucose-Capillary: 134 mg/dL — ABNORMAL HIGH (ref 70–99)

## 2021-06-27 LAB — BASIC METABOLIC PANEL
Anion gap: 8 (ref 5–15)
BUN: 55 mg/dL — ABNORMAL HIGH (ref 8–23)
CO2: 21 mmol/L — ABNORMAL LOW (ref 22–32)
Calcium: 8.8 mg/dL — ABNORMAL LOW (ref 8.9–10.3)
Chloride: 110 mmol/L (ref 98–111)
Creatinine, Ser: 2.57 mg/dL — ABNORMAL HIGH (ref 0.44–1.00)
GFR, Estimated: 19 mL/min — ABNORMAL LOW (ref >60)
Glucose, Bld: 148 mg/dL — ABNORMAL HIGH (ref 70–99)
Potassium: 5 mmol/L (ref 3.5–5.1)
Sodium: 139 mmol/L (ref 135–145)

## 2021-06-27 NOTE — Progress Notes (Signed)
PROGRESS NOTE    Alyssa [name]  OZH:[id number] DOB: [birth year] DOA: 06/15/2021 PCP: [name], DO   Chief Complaint  Patient presents with   Generalized Body Aches   Brief Narrative: 80 year old female with hypertension, HLD, COPD, PVD, CKD stage IV, COVID-19 infection in 2021, dementia presents with pain all over x3 days along with a rash.  She was seen in the ED creatinine 3.1, troponin 31 chest x-ray left lower lobe collapse COVID-19 negative CT of the chest abdomen pelvis showed left lower lobe collapse without visible mass or adenopathy and femoral bypass graft with increased fluid collection and bypass anastomosis measuring 4.8 X4.1 previously 2.6 X2.6 Patient was given valacyclovir IV fluids and admitted for further management for dyspnea due to left lung collapse, hypertension urgency, weight loss, borderline elevated troponin, nongap metabolic acidosis, anemia. Patient was managed with Valtrex, pain control supplemental oxygen. Seen by PT OT and skilled nursing facility recommended.  Subjective: Patient reports she feels better he still has some pain but controlled.  Eating well.  No new complaints.  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 Continue optimize pain control.  May need supplemental oxygen during night, continue bronchodilators, PT OT, incentive spirometry .  Repeat chest x-ray shows persistent volume loss left lung base slightly improved aeration present within the left lower lobe question left pleural fluid . Pt was seen by pulm for same and they are planning to repeat CT chest and outpatient follow-up in a month-if still not reexpanded then may need inspection bronchoscopy.  Continue supplemental  oxygen as needed. Seen and examined  Acute Shingles/herpes zoster viral: involving the left flank and left abdomen: Rash/crusting except 1 on the left flank  and now almost crusting.Continue on contact/airborne precaution. Cont on PO valacyclovir, continue pain management with Lyrica, capsicum cream, oxycodone, and a steroid taper for neuritis pain   Hypertension urgency: BP stable currently continue amlodipine, Coreg, clonidine.  prn oral hydralazine.  CKD stage IV/metabolic  acidosis-previous creatinine around 2.6-2.9. Creatinine improved to 2.5-cut down IV fluids, encourage oral intake.  Recent Labs  Lab 06/23/21 0433 06/24/21 0840 06/25/21 0419 06/26/21 0630 06/27/21 0328  BUN 46* 46* 50* 50* 55*  CREATININE 3.14* 2.98* 2.99* 2.73* 2.57*    Hyperkalemia on Lokelma -Lokelma stopped potassium uptrending.  We will resume every other day Recent Labs  Lab 06/23/21 0433 06/24/21 0840 06/25/21 0419 06/26/21 0630 06/27/21 0328  K 4.0 4.4 4.4 4.5 5.0     PVD- Fem fem bypass  in 2011/CT on admission showing fluid collection from pseudoaneurysm, seen by Dr. 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 she is on Plavix aspirin and statin,. I will dw vascualr while she is here.  Mild leukocytosis at 11 to 12 g.  Watch as wbc trending up-he is afebrile.  She is on prednisone which could contribute.  Monitor. Recent Labs  Lab 06/21/21 1330 06/24/21 0840 06/25/21 0419 06/26/21 0630 06/27/21 0328  WBC 7.6 11.7* 12.2* 12.8* 12.4*     History of tobacco abuse-has quit few months ago Moderate protein calorie malnutrition: Augment diet as tolerated Dementia: Alert awake but not much communicative, able to express pain here.  Cont fall precaution, aspiration precaution.  Continue delirium precaution.  Mood is stable.  Deconditioning: Cont PT OT and will need SNF   HLD Cont statin Anemia of chronic kidney disease stable  in 7- mid 8s g with range.  Monitor  Recent Labs  Lab 06/21/21 1330 06/24/21 0840 06/25/21 0419 06/26/21 0630 06/27/21 0328  HGB 8.6* 8.4* 7.9* 8.4* 7.7*  HCT 25.8* 24.7* 23.2* 24.1* 22.9*   Depression with anxiety-mood stable  Diet Order             Diet renal with fluid restriction Fluid restriction: 1200 mL Fluid; Room service appropriate? Yes; Fluid consistency: Thin  Diet effective now                   Nutrition Problem: Moderate Malnutrition Etiology: chronic illness (dementia, COPD) Signs/Symptoms: mild fat depletion, moderate fat depletion, mild muscle depletion, moderate muscle depletion Interventions: Ensure Enlive (each supplement provides 350kcal and 20 grams of protein), MVI, Liberalize Diet Patient's Body mass index is 19.48 kg/m.  DVT prophylaxis: heparin injection 5,000 Units Start: 06/16/21 0930 Code Status:   Code Status: DNR  Family Communication: plan of care discussed with patient AND CALLED HER SON and updated  Status is: Inpatient Remains inpatient appropriate because:Inpatient level of care appropriate due to severity of illness  Dispo:The patient is from: Home            Anticipated d/c is to: SNF  once available            Patient currently is medically stable            Difficult to place patient No  Unresulted Labs (From admission, onward)    None      Medications reviewed:  Scheduled Meds:  acetaminophen  1,000 mg Oral TID   amLODipine  10 mg Oral QHS   arformoterol  15 mcg Nebulization BID   And   budesonide (PULMICORT) nebulizer solution  0.25 mg Nebulization BID   And   revefenacin  175 mcg Nebulization Daily   aspirin EC  81 mg Oral Daily   capsicum   Topical TID   carvedilol  12.5 mg Oral BID   cloNIDine  0.2 mg Oral QHS   clopidogrel  75 mg Oral Daily   donepezil  5 mg Oral Q2200   DULoxetine  30 mg Oral Daily   feeding supplement  237 mL Oral TID BM    heparin  5,000 Units Subcutaneous Q8H   insulin aspart  0-5 Units Subcutaneous QHS   insulin aspart  0-6 Units Subcutaneous TID WC   megestrol  40 mg Oral BID   multivitamin with minerals  1 tablet Oral Daily   pantoprazole  40 mg Oral Daily   predniSONE  10 mg Oral Q breakfast   pregabalin  75 mg Oral Daily   rosuvastatin  40 mg Oral Daily   sodium bicarbonate  650 mg Oral BID   sodium chloride flush  3 mL Intravenous Q12H   Continuous Infusions:  lactated ringers 50 mL/hr at 06/27/21 0657   Consultants:see note  Procedures:see note Antimicrobials: Anti-infectives (From admission, onward)    Start     Dose/Rate Route Frequency Ordered Stop   06/17/21 0800  valACYclovir (VALTREX) tablet 1,000 mg        1,000 mg Oral Daily 06/16/21 0839 06/22/21 0900   06/16/21 0515  valACYclovir (VALTREX) tablet 1,000 mg        1,000 mg Oral STAT 06/16/21 0502 06/16/21  0543      Culture/Microbiology    Component Value Date/Time   SDES URINE, RANDOM 09/29/2020 2353   SPECREQUEST  09/29/2020 2353    NONE Performed at [facility] [address] [place], [place] [address]    CULT 20,000 COLONIES/mL ENTEROBACTER AEROGENES (A) 09/29/2020 2353   REPTSTATUS 10/01/2020 FINAL 09/29/2020 2353    Other culture-see note  Objective: Vitals: Today's Vitals   06/27/21 0515 06/27/21 0608 06/27/21 0926 06/27/21 0927  BP: 138/75     Pulse: 72     Resp: 16     Temp: 98.5 F (36.9 C)     TempSrc: Oral     SpO2: 97%  96% 99%  Weight:      Height:      PainSc:  9       Intake/Output Summary (Last 24 hours) at 06/27/2021 1140 Last data filed at 06/27/2021 0522 Gross per 24 hour  Intake 2045 ml  Output 1725 ml  Net 320 ml    Filed Weights   06/19/21 0519 06/23/21 2137 06/25/21 2139  Weight: 49.9 kg 49.9 kg 49.9 kg   Weight change:   Intake/Output from previous day: 09/09 0701 - 09/10 0700 In: 2045 [P.O.:657; I.V.:1388] Out: 1725 [Urine:1725] Intake/Output this shift: No  intake/output data recorded. Filed Weights   06/19/21 0519 06/23/21 2137 06/25/21 2139  Weight: 49.9 kg 49.9 kg 49.9 kg   Examination: General exam: AAO, mild confusion-dementia, pleasant, elderly, not in distress.  HEENT:Oral mucosa moist, Ear/Nose WNL grossly, dentition normal. Respiratory system: bilaterally diminished,  no use of accessory muscle Cardiovascular system: S1 & S2 +, No JVD,. Gastrointestinal system: Abdomen soft, NT,ND, BS+ Nervous System:Alert, awake, moving extremities and grossly nonfocal Extremities: no edema, distal peripheral pulses palpable.  Skin: Crusted raised skin rash on the abdomen and back MSK: Normal muscle bulk,tone, power    Data Reviewed: I have personally reviewed following labs and imaging studies CBC: Recent Labs  Lab 06/21/21 1330 06/24/21 0840 06/25/21 0419 06/26/21 0630 06/27/21 0328  WBC 7.6 11.7* 12.2* 12.8* 12.4*  NEUTROABS 5.9  --   --   --   --   HGB 8.6* 8.4* 7.9* 8.4* 7.7*  HCT 25.8* 24.7* 23.2* 24.1* 22.9*  MCV 84.3 83.4 82.9 82.5 84.2  PLT 428* 438* 461* 487* 445*    Basic Metabolic Panel: Recent Labs  Lab 06/21/21 1330 06/22/21 0229 06/23/21 0433 06/24/21 0840 06/25/21 0419 06/26/21 0630 06/27/21 0328  NA 134*  132* 135 133* 133* 134* 136 139  K 6.3*  6.3* 4.4 4.0 4.4 4.4 4.5 5.0  CL 102  103 103 96* 100 100 106 110  CO2 19*  20* 24 26 22  21* 22 21*  GLUCOSE 256*  256* 150* 120* 166* 114* 103* 148*  BUN 43*  43* 46* 46* 46* 50* 50* 55*  CREATININE 3.07*  3.13* 3.02* 3.14* 2.98* 2.99* 2.73* 2.57*  CALCIUM 9.1  9.1 8.5* 8.6* 8.8* 9.3 8.9 8.8*  PHOS 3.3 3.3  --   --   --   --   --     GFR: Estimated Creatinine Clearance: 14.4 mL/min (A) (by C-G formula based on SCr of 2.57 mg/dL (H)). Liver Function Tests: Recent Labs  Lab 06/21/21 1330 06/22/21 0229  ALBUMIN 2.9* 2.5*    No results for input(s): LIPASE, AMYLASE in the last 168 hours. No results for input(s): AMMONIA in the last 168  hours. Coagulation Profile: No results for input(s): INR, PROTIME in the last  168 hours. Cardiac Enzymes: No results for input(s): CKTOTAL, CKMB, CKMBINDEX, TROPONINI in the last 168 hours. BNP (last 3 results) No results for input(s): PROBNP in the last 8760 hours. HbA1C: No results for input(s): HGBA1C in the last 72 hours.  CBG: Recent Labs  Lab 06/26/21 0640 06/26/21 1223 06/26/21 1745 06/26/21 2027 06/27/21 0703  GLUCAP 98 163* 141* 192* 123*    Lipid Profile: No results for input(s): CHOL, HDL, LDLCALC, TRIG, CHOLHDL, LDLDIRECT in the last 72 hours. Thyroid Function Tests: No results for input(s): TSH, T4TOTAL, FREET4, T3FREE, THYROIDAB in the last 72 hours. Anemia Panel: No results for input(s): VITAMINB12, FOLATE, FERRITIN, TIBC, IRON, RETICCTPCT in the last 72 hours. Sepsis Labs: No results for input(s): PROCALCITON, LATICACIDVEN in the last 168 hours.  No results found for this or any previous visit (from the past 240 hour(s)).    Radiology Studies: DG Chest Port 1 View  Result Date: 06/26/2021 CLINICAL DATA:  Collapse of left lung. EXAM: PORTABLE CHEST 1 VIEW COMPARISON:  Chest radiograph 06/15/2021 chest CT 06/16/2021 FINDINGS: Single view of the chest again demonstrates volume loss at the left lung base. Increased aeration or air bronchograms at the left lung base. Slightly coarse lung markings compatible with underlying emphysema. Heart size is grossly stable. Atherosclerotic calcifications at the aortic arch. Again noted is elevation of the right humeral head suggestive for underlying rotator cuff disease. Difficult to exclude left pleural fluid. IMPRESSION: Persistent volume loss at left lung base. However, there appears to be slightly improved aeration, presumably in the left lower lobe. Question left pleural fluid. Electronically Signed   By: Markus Daft M.D.   On: 06/26/2021 07:43     LOS: 8 days   Antonieta Pert, MD [facility]  06/27/2021, 11:40 AM

## 2021-06-27 NOTE — Plan of Care (Signed)
  Problem: Education: Goal: Knowledge of General Education information will improve Description: Including pain rating scale, medication(s)/side effects and non-pharmacologic comfort measures Outcome: Progressing   Problem: Pain Managment: Goal: General experience of comfort will improve Outcome: Progressing   

## 2021-06-27 NOTE — Consult Note (Signed)
[facility] [place]  ASSESSMENT / PLAN: 80 y.o. 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  History of femoral-femoral bypass grafting in 2011 by Dr. Oneida Alar.  The pseudoaneurysm will need to be repaired at some point.  This can be done electively once she is less acutely ill.  I will message my office staff to arrange for outpatient follow-up with me in the next 2 to 3 weeks to discuss repair.  Please call for questions..  CHIEF COMPLAINT: Dyspnea  HISTORY OF PRESENT ILLNESS: Alyssa [name] is a 80 y.o. female admitted to the internal medicine service for dyspnea, atelectasis associated with chest wall herpes zoster infection.  The patient previously followed with Dr. Oneida Alar who performed a femoral-femoral bypass graft for her in 2011.  She was lost to follow-up in 2015.  On my exam, the patient is somnolent.  She awakens easily, but appears uncomfortable and cannot answer many questions.  She appears frail.  Past Medical History:  Diagnosis Date   Acute respiratory failure with hypoxia ([place]) 11/01/2019   Anxiety    Arthritis    Chest pain    Chronic kidney disease    Dyspnea    Elevated troponin 12/13/2018   Headache(784.0)    Hyperlipidemia    Hypertension    Joint pain    Leg pain    Peripheral neuropathy    Peripheral vascular disease ([place])    Syncope 09/2020    Past Surgical History:  Procedure Laterality Date   ABDOMINAL ANGIOGRAM N/A 10/29/2011   Procedure: ABDOMINAL ANGIOGRAM;  Surgeon: Elam Dutch, MD;  Location: [facility] CATH LAB;  Service: Cardiovascular;  Laterality: N/A;   BACK SURGERY     FEMORAL-FEMORAL BYPASS GRAFT  08/31/2010   FLEXIBLE SIGMOIDOSCOPY N/A 06/12/2019   Procedure: FLEXIBLE SIGMOIDOSCOPY;  Surgeon: Ladene Artist, MD;  Location: [facility];  Service: Endoscopy;  Laterality: N/A;  Patient had little bit to eat this morning so this will be unsedated.   JOINT REPLACEMENT Right 2012   Hip   LOWER  EXTREMITY ANGIOGRAPHY  06/14/2017   Procedure: Lower Extremity Angiography;  Surgeon: Adrian Prows, MD;  Location: [facility];  Service: Cardiovascular;;  Bilateral limited angio performed   RENAL ANGIOGRAPHY N/A 06/14/2017   Procedure: Renal Angiography;  Surgeon: Adrian Prows, MD;  Location: [facility];  Service: Cardiovascular;  Laterality: N/A;   TOTAL HIP ARTHROPLASTY     right    Family History  Problem Relation Age of Onset   Heart attack Mother    Heart disease Mother    Hyperlipidemia Mother    Hypertension Mother    Heart attack Father    Heart disease Father        Before age 68   Hyperlipidemia Father    Hypertension Father    Cancer Sister        brain tumor   Aneurysm Brother        brain   Colon cancer Neg Hx    Liver cancer Neg Hx     Social History   Socioeconomic History   Marital status: Single    Spouse name: Not on file   Number of children: 2   Years of education: 11th   Highest education level: Not on file  Occupational History   Occupation: Retired  Tobacco Use   Smoking status: Every Day    Packs/day: 1.00    Years: 50.00    Pack years: 50.00  Types: Cigarettes   Smokeless tobacco: Never   Tobacco comments:    1/4 ppd 12/05/20//lmr  Vaping Use   Vaping Use: Never used  Substance and Sexual Activity   Alcohol use: No   Drug use: No   Sexual activity: Not Currently  Other Topics Concern   Not on file  Social History Narrative    Lives at home with her brother.   Right-handed.   Occasional caffeine use.   Social Determinants of Health   Financial Resource Strain: Not on file  Food Insecurity: Not on file  Transportation Needs: Not on file  Physical Activity: Not on file  Stress: Not on file  Social Connections: Not on file  Intimate Partner Violence: Not on file    No Known Allergies  Current Facility-Administered Medications  Medication Dose Route Frequency Provider Last Rate Last Admin   acetaminophen  (TYLENOL) tablet 1,000 mg  1,000 mg Oral TID Rosezella Rumpf, NP   1,000 mg at 06/27/21 0847   albuterol (PROVENTIL) (2.5 MG/3ML) 0.083% nebulizer solution 2.5 mg  2.5 mg Nebulization Q4H PRN Fuller Plan A, MD       amLODipine (NORVASC) tablet 10 mg  10 mg Oral QHS Dana Allan I, MD   10 mg at 06/26/21 2217   arformoterol (BROVANA) nebulizer solution 15 mcg  15 mcg Nebulization BID Fuller Plan A, MD   15 mcg at 06/27/21 6761   And   budesonide (PULMICORT) nebulizer solution 0.25 mg  0.25 mg Nebulization BID Fuller Plan A, MD   0.25 mg at 06/27/21 9509   And   revefenacin (YUPELRI) nebulizer solution 175 mcg  175 mcg Nebulization Daily Fuller Plan A, MD   175 mcg at 06/27/21 3267   aspirin EC tablet 81 mg  81 mg Oral Daily Smith, Rondell A, MD   81 mg at 06/27/21 0847   capsicum (ZOSTRIX) 0.075 % cream   Topical TID Rosezella Rumpf, NP   Given at 06/27/21 0858   carvedilol (COREG) tablet 12.5 mg  12.5 mg Oral BID Fuller Plan A, MD   12.5 mg at 06/27/21 0847   cloNIDine (CATAPRES) tablet 0.2 mg  0.2 mg Oral QHS Smith, Rondell A, MD   0.2 mg at 06/26/21 2217   clopidogrel (PLAVIX) tablet 75 mg  75 mg Oral Daily Smith, Rondell A, MD   75 mg at 06/27/21 0847   donepezil (ARICEPT) tablet 5 mg  5 mg Oral Q2200 Smith, Rondell A, MD   5 mg at 06/26/21 2217   DULoxetine (CYMBALTA) DR capsule 30 mg  30 mg Oral Daily Smith, Rondell A, MD   30 mg at 06/27/21 0847   feeding supplement (ENSURE ENLIVE / ENSURE PLUS) liquid 237 mL  237 mL Oral TID BM Dahal, Marlowe Aschoff, MD   237 mL at 06/27/21 0849   heparin injection 5,000 Units  5,000 Units Subcutaneous Q8H Smith, Rondell A, MD   5,000 Units at 06/27/21 0510   hydrALAZINE (APRESOLINE) tablet 25 mg  25 mg Oral Q6H PRN Kc, Ramesh, MD       insulin aspart (novoLOG) injection 0-5 Units  0-5 Units Subcutaneous QHS Dana Allan I, MD       insulin aspart (novoLOG) injection 0-6 Units  0-6 Units Subcutaneous TID WC Dana Allan I, MD    1 Units at 06/26/21 1227   lactated ringers infusion   Intravenous Continuous Chotiner, Yevonne Aline, MD 50 mL/hr at 06/27/21 0657 New Bag at 06/27/21 0657   megestrol (  MEGACE) tablet 40 mg  40 mg Oral BID Fuller Plan A, MD   40 mg at 06/27/21 0848   multivitamin with minerals tablet 1 tablet  1 tablet Oral Daily Dahal, Marlowe Aschoff, MD   1 tablet at 06/27/21 0848   ondansetron (ZOFRAN) tablet 4 mg  4 mg Oral Q6H PRN Norval Morton, MD       Or   ondansetron (ZOFRAN) injection 4 mg  4 mg Intravenous Q6H PRN Fuller Plan A, MD       oxyCODONE (Oxy IR/ROXICODONE) immediate release tablet 5 mg  5 mg Oral Q4H PRN Rosezella Rumpf, NP   5 mg at 06/25/21 2140   oxyCODONE (Oxy IR/ROXICODONE) immediate release tablet 7.5 mg  7.5 mg Oral Q4H PRN Rosezella Rumpf, NP   7.5 mg at 06/27/21 6578   pantoprazole (PROTONIX) EC tablet 40 mg  40 mg Oral Daily Dahal, Marlowe Aschoff, MD   40 mg at 06/27/21 0847   predniSONE (DELTASONE) tablet 10 mg  10 mg Oral Q breakfast Kc, Maren Beach, MD   10 mg at 06/27/21 0847   pregabalin (LYRICA) capsule 75 mg  75 mg Oral Daily Rosezella Rumpf, NP   75 mg at 06/27/21 0848   rosuvastatin (CRESTOR) tablet 40 mg  40 mg Oral Daily Fuller Plan A, MD   40 mg at 06/27/21 0848   sodium bicarbonate tablet 650 mg  650 mg Oral BID Dana Allan I, MD   650 mg at 06/27/21 0847   sodium chloride flush (NS) 0.9 % injection 3 mL  3 mL Intravenous Q12H Fuller Plan A, MD   3 mL at 06/26/21 2217    REVIEW OF SYSTEMS:  [X]  denotes positive finding, [ ]  denotes negative finding Cardiac  Comments:  Chest pain or chest pressure:    Shortness of breath upon exertion:    Short of breath when lying flat:    Irregular heart rhythm:        Vascular    Pain in calf, thigh, or hip brought on by ambulation:    Pain in feet at night that wakes you up from your sleep:     Blood clot in your veins:    Leg swelling:         Pulmonary    Oxygen at home:    Productive cough:      Wheezing:         Neurologic    Sudden weakness in arms or legs:     Sudden numbness in arms or legs:     Sudden onset of difficulty speaking or slurred speech:    Temporary loss of vision in one eye:     Problems with dizziness:         Gastrointestinal    Blood in stool:     Vomited blood:         Genitourinary    Burning when urinating:     Blood in urine:        Psychiatric    Major depression:         Hematologic    Bleeding problems:    Problems with blood clotting too easily:        Skin    Rashes or ulcers:        Constitutional    Fever or chills:      PHYSICAL EXAM Vitals:   06/26/21 2217 06/27/21 0515 06/27/21 0926 06/27/21 0927  BP: (!) 148/90 138/75    Pulse: 77  72    Resp:  16    Temp:  98.5 F (36.9 C)    TempSrc:  Oral    SpO2:  97% 96% 99%  Weight:      Height:        Constitutional: Chronically ill appearing. Frail. No distress. Appears malnourished.  Neurologic: CN intact. no focal findings. no sensory loss. Psychiatric:  Mood and affect symmetric and appropriate. Eyes:  No icterus. No conjunctival pallor. Ears, nose, throat:  mucous membranes moist. Midline trachea.  Cardiac: regular rate and rhythm.  Respiratory: dyspnea with splinting. Abdominal:  soft, non-tender, non-distended.  Peripheral vascular: Large pulsatile mass in right groin consistent with femoral anastomotic pseudoaneurysm.  2+ pulse in left femoral artery. Extremity: no edema. no cyanosis. no pallor.  Skin: No gangrene. No ulceration.  Lymphatic: no Stemmer's sign. no palpable lymphadenopathy.  PERTINENT LABORATORY AND RADIOLOGIC DATA  Most recent CBC CBC Latest Ref Rng & Units 06/27/2021 06/26/2021 06/25/2021  WBC 4.0 - 10.5 K/uL 12.4(H) 12.8(H) 12.2(H)  Hemoglobin 12.0 - 15.0 g/dL 7.7(L) 8.4(L) 7.9(L)  Hematocrit 36.0 - 46.0 % 22.9(L) 24.1(L) 23.2(L)  Platelets 150 - 400 K/uL 445(H) 487(H) 461(H)     Most recent CMP CMP Latest Ref Rng & Units 06/27/2021 06/26/2021  06/25/2021  Glucose 70 - 99 mg/dL 148(H) 103(H) 114(H)  BUN 8 - 23 mg/dL 55(H) 50(H) 50(H)  Creatinine 0.44 - 1.00 mg/dL 2.57(H) 2.73(H) 2.99(H)  Sodium 135 - 145 mmol/L 139 136 134(L)  Potassium 3.5 - 5.1 mmol/L 5.0 4.5 4.4  Chloride 98 - 111 mmol/L 110 106 100  CO2 22 - 32 mmol/L 21(L) 22 21(L)  Calcium 8.9 - 10.3 mg/dL 8.8(L) 8.9 9.3  Total Protein 6.5 - 8.1 g/dL - - -  Total Bilirubin 0.3 - 1.2 mg/dL - - -  Alkaline Phos 38 - 126 U/L - - -  AST 15 - 41 U/L - - -  ALT 0 - 44 U/L - - -    Renal function Estimated Creatinine Clearance: 14.4 mL/min (A) (by C-G formula based on SCr of 2.57 mg/dL (H)).  Hgb A1c MFr Bld (%)  Date Value  06/21/2021 6.7 (H)   Yevonne Aline. Stanford Breed, MD [facility] Phone Number: [phone] 06/27/2021 9:57 AM  Total time spent on preparing this encounter including chart review, data review, collecting history, examining the patient, coordinating care for this new patient, 60 minutes.  Portions of this report may have been transcribed using voice recognition software.  Every effort has been made to ensure accuracy; however, inadvertent computerized transcription errors may still be present.

## 2021-06-28 DIAGNOSIS — Z7189 Other specified counseling: Secondary | ICD-10-CM | POA: Diagnosis not present

## 2021-06-28 DIAGNOSIS — R52 Pain, unspecified: Secondary | ICD-10-CM | POA: Diagnosis not present

## 2021-06-28 DIAGNOSIS — Z515 Encounter for palliative care: Secondary | ICD-10-CM | POA: Diagnosis not present

## 2021-06-28 DIAGNOSIS — R0602 Shortness of breath: Secondary | ICD-10-CM | POA: Diagnosis not present

## 2021-06-28 LAB — BASIC METABOLIC PANEL
Anion gap: 7 (ref 5–15)
BUN: 54 mg/dL — ABNORMAL HIGH (ref 8–23)
CO2: 21 mmol/L — ABNORMAL LOW (ref 22–32)
Calcium: 9 mg/dL (ref 8.9–10.3)
Chloride: 111 mmol/L (ref 98–111)
Creatinine, Ser: 2.43 mg/dL — ABNORMAL HIGH (ref 0.44–1.00)
GFR, Estimated: 20 mL/min — ABNORMAL LOW (ref >60)
Glucose, Bld: 120 mg/dL — ABNORMAL HIGH (ref 70–99)
Potassium: 5.2 mmol/L — ABNORMAL HIGH (ref 3.5–5.1)
Sodium: 139 mmol/L (ref 135–145)

## 2021-06-28 LAB — GLUCOSE, CAPILLARY
Glucose-Capillary: 122 mg/dL — ABNORMAL HIGH (ref 70–99)
Glucose-Capillary: 117 mg/dL — ABNORMAL HIGH (ref 70–99)
Glucose-Capillary: 184 mg/dL — ABNORMAL HIGH (ref 70–99)
Glucose-Capillary: 160 mg/dL — ABNORMAL HIGH (ref 70–99)

## 2021-06-28 LAB — CBC
HCT: 23.8 % — ABNORMAL LOW (ref 36.0–46.0)
Hemoglobin: 8.1 g/dL — ABNORMAL LOW (ref 12.0–15.0)
MCH: 28.8 pg (ref 26.0–34.0)
MCHC: 34 g/dL (ref 30.0–36.0)
MCV: 84.7 fL (ref 80.0–100.0)
Platelets: 463 10*3/uL — ABNORMAL HIGH (ref 150–400)
RBC: 2.81 MIL/uL — ABNORMAL LOW (ref 3.87–5.11)
RDW: 18 % — ABNORMAL HIGH (ref 11.5–15.5)
WBC: 19.8 10*3/uL — ABNORMAL HIGH (ref 4.0–10.5)
nRBC: 0.2 % (ref 0.0–0.2)

## 2021-06-28 LAB — PROCALCITONIN: Procalcitonin: 0.24 ng/mL

## 2021-06-28 MED ORDER — ROSUVASTATIN CALCIUM 5 MG PO TABS
10.0000 mg | ORAL_TABLET | Freq: Every day | ORAL | Status: DC
  Administered 2021-06-28 – 2021-07-10 (×13): 10 mg via ORAL
  Filled 2021-06-28 (×13): qty 2

## 2021-06-28 MED ORDER — OXYCODONE HCL 5 MG PO TABS
5.0000 mg | ORAL_TABLET | ORAL | Status: AC
  Administered 2021-06-28: 5 mg via ORAL
  Filled 2021-06-28: qty 1

## 2021-06-28 MED ORDER — ACETAMINOPHEN 500 MG PO TABS
1000.0000 mg | ORAL_TABLET | Freq: Three times a day (TID) | ORAL | Status: DC
  Administered 2021-06-28 – 2021-06-29 (×3): 1000 mg via ORAL
  Filled 2021-06-28 (×3): qty 2

## 2021-06-28 MED ORDER — POLYETHYLENE GLYCOL 3350 17 G PO PACK
17.0000 g | PACK | Freq: Every day | ORAL | Status: DC
  Administered 2021-06-28 – 2021-07-02 (×5): 17 g via ORAL
  Filled 2021-06-28 (×5): qty 1

## 2021-06-28 MED ORDER — SODIUM ZIRCONIUM CYCLOSILICATE 10 G PO PACK
10.0000 g | PACK | Freq: Every day | ORAL | Status: DC
  Administered 2021-06-28 – 2021-07-05 (×8): 10 g via ORAL
  Filled 2021-06-28 (×8): qty 1

## 2021-06-28 MED ORDER — SODIUM CHLORIDE 0.9 % IV SOLN
1.5000 g | Freq: Two times a day (BID) | INTRAVENOUS | Status: DC
  Administered 2021-06-28 – 2021-06-30 (×5): 1.5 g via INTRAVENOUS
  Filled 2021-06-28 (×6): qty 4

## 2021-06-28 MED ORDER — OXYCODONE HCL 5 MG PO TABS: 5.0000 mg | ORAL_TABLET | Freq: Three times a day (TID) | ORAL | Status: DC

## 2021-06-28 MED ORDER — BISACODYL 10 MG RE SUPP
10.0000 mg | Freq: Every day | RECTAL | Status: DC | PRN
  Administered 2021-07-02: 10 mg via RECTAL
  Filled 2021-06-28 (×2): qty 1

## 2021-06-28 MED ORDER — OXYCODONE HCL 5 MG PO TABS
5.0000 mg | ORAL_TABLET | Freq: Three times a day (TID) | ORAL | Status: DC
  Administered 2021-06-28 – 2021-06-29 (×3): 5 mg via ORAL
  Filled 2021-06-28 (×3): qty 1

## 2021-06-28 MED ORDER — LIDOCAINE 5 % EX PTCH
2.0000 | MEDICATED_PATCH | CUTANEOUS | Status: DC
  Administered 2021-06-28 – 2021-07-10 (×13): 2 via TRANSDERMAL
  Filled 2021-06-28 (×13): qty 2

## 2021-06-28 MED ORDER — DULOXETINE HCL 20 MG PO CPEP
40.0000 mg | ORAL_CAPSULE | Freq: Every day | ORAL | Status: DC
  Administered 2021-06-29 – 2021-07-10 (×12): 40 mg via ORAL
  Filled 2021-06-28 (×12): qty 2

## 2021-06-28 NOTE — Progress Notes (Signed)
Pharmacy Antibiotic Note  Alyssa [name] is a 80 y.o. female admitted on 06/15/2021 with pneumonia.  Pharmacy has been consulted for Unasyn dosing.  ID: Afebrile. WBC 19.8 trending up now on prednisone. No new CXR. - valtrex for shingles/PHN   9/11: Unasyn>>  Plan: Unasyn 1.5g IV q12 hrs adjusted for renal function New CXR? Pharmacy will sign off. Please reconsult for further dosing assitance.   Height: 5\' 3"  (160 cm) Weight: 49.9 kg (109 lb 15.8 oz) IBW/kg (Calculated) : 52.4  Temp (24hrs), Avg:98.5 F (36.9 C), Min:98.2 F (36.8 C), Max:98.8 F (37.1 C)  Recent Labs  Lab 06/24/21 0840 06/25/21 0419 06/26/21 0630 06/27/21 0328 06/28/21 0802  WBC 11.7* 12.2* 12.8* 12.4* 19.8*  CREATININE 2.98* 2.99* 2.73* 2.57* 2.43*    Estimated Creatinine Clearance: 15.3 mL/min (A) (by C-G formula based on SCr of 2.43 mg/dL (H)).    No Known Allergies  Alyssa [name] S. [name], PharmD, BCPS Clinical Staff Pharmacist [email or web address]   Wayland Salinas 06/28/2021 1:22 PM

## 2021-06-28 NOTE — Progress Notes (Addendum)
Palliative Medicine Inpatient Follow Up Note  Reason for Consultation: Pain control   HPI/Patient Profile: Palliative Care consult requested for symptom management (Pain) in this 80 y.o. female  with past medical history of anxiety, CKD IV, COPD, tobaccos use, hypertension, hyperlipidemia, PVD, COVID-19 (05/2020). She was admitted on 06/15/2021 from home with herpes zoster, hypertension, and generalized body aches. Chest x-ray showed left lung collapse, no evidence of obstruction.    Today's Discussion (06/28/2021):  *Please note that this is a verbal dictation therefore any spelling or grammatical errors are due to the "[employer]" system interpretation.  Chart reviewed.   I met with Alyssa [name] at bedside. She expresses that she does have relief of pain after tylenol and oxycodone are given. She shares when breakthrough pain occurs it is 8-9/10 and radiates down her left thigh. She has open lesion posterior and we reviewed that there is nerve inflammation which is occurring making her uncomfortable. We reviewed switching her capsicin to Lidoderm patches and increasing cymbalta. I encouraged [name] to get OOB as I worry some of her pain is in the setting of immobility.  I spoke with patients bedside RN, Seth Bake who shares that the pain comes and goes from her assessment. She reviews co-horting medications so that she is not in and out of the patients room. We reviewed the importance of maintaining mobility and getting [name] up in the chair.   Questions and concerns addressed   Objective Assessment: Vital Signs Vitals:   06/28/21 0648 06/28/21 0814  BP: (!) 152/91   Pulse: 81 79  Resp: 18 16  Temp: 98.5 F (36.9 C)   SpO2: 96% 97%    Intake/Output Summary (Last 24 hours) at 06/28/2021 1223 Last data filed at 06/28/2021 0600 Gross per 24 hour  Intake --  Output 1000 ml  Net -1000 ml    Last Weight  Most recent update: 06/25/2021 10:36 PM    Weight  49.9 kg (109 lb 15.8  oz)            Physical Exam General: NAD, elderly [name]  Cardiovascular: regular rate and rhythm Pulmonary: diminished bilaterally  Abdomen: soft, nontender, + bowel sounds Extremities: no edema, no joint deformities Skin: left-sided zosters, painful to touch, alopecia  Neurological: AAOx3, mood appropriate   SUMMARY OF RECOMMENDATIONS   DNR/DNI  Continue with current plan of care  Discussed current pain regimen and options, modification as below Modified current pain regimen Lyrica $RemoveBefore'75mg'pMUEpaqzfnUGY$  PO Qday Increased Cymbalta to $RemoveBef'40mg'dxrxaKkoOg$  PO Qday   Amitriptyline (TCA) has been considered but the SE profile remains high for our geriatric population Tylenol 1g PO TID DC Capsicum cream   Start Lidoderm patches Oxycodone $RemoveBeforeDEI'5mg'zDNEmYanxfYYaiUY$  PO TID & Oxycodone 5/7.$RemoveBeforeD'5mg'glTIUybdirixKR$  PO Q4H PRN moderate/severe pain depending on tolerance could remove TID IR and add a ER version If the above are ineffective would be worthwhile to consider a nerve block by IR PMT will continue to support and follow until discharge  Time Spent: 45 Greater than 50% of the time was spent in counseling and coordination of care ______________________________________________________________________________________ [facility] Team Team Cell Phone: [phone] Please utilize secure chat with additional questions, if there is no response within 30 minutes please call the above phone number  Palliative Medicine Team providers are available by phone from 7am to 7pm daily and can be reached through the team cell phone.  Should this patient require assistance outside of these hours, please call the patient's attending physician.

## 2021-06-28 NOTE — TOC Progression Note (Signed)
Transition of Care [facility]) - Progression Note    Patient Details  Name: KELAIAH ESCALONA MRN: [id number] Date of Birth: Sep 05, 1941  Transition of Care [facility]) CM/SW [place], [place] Phone Number: 06/28/2021, 11:24 AM  Clinical Narrative:    CSW noted that pt had chosen [facility], CSW attempted to start Authorization, but pt is not managed through [facility] at this time. CSW notified Facility and they will begin authorization. CSW will hold on requesting covid test for now. TOC will continue to follow for DC planning.   Expected Discharge Plan: [place] Barriers to Discharge: Continued Medical Work up  Expected Discharge Plan and Services Expected Discharge Plan: [place] In-house Referral: Clinical Social Work     Living arrangements for the past 2 months: Single Family Home                                       Social Determinants of Health (SDOH) Interventions    Readmission Risk Interventions Readmission Risk Prevention Plan 07/10/2020 12/26/2018  Transportation Screening Complete Complete  Medication Review [employer] photographer) Complete Complete  PCP or Specialist appointment within 3-5 days of discharge - Not Complete  PCP/Specialist Appt Not Complete comments - pt for snf. Snf MD to see as needed.  [facility] or Home Care Consult Complete Not Complete  HRI or Home Care Consult Pt Refusal Comments - NA  SW Recovery Care/Counseling Consult Complete Not Complete  SW Consult Not Complete Comments - NA  Palliative Care Screening Not Applicable Complete  Skilled Nursing Facility Not Applicable Complete  Some recent data might be hidden

## 2021-06-28 NOTE — Progress Notes (Signed)
PROGRESS NOTE    Alyssa [name]  RXV:[id number] DOB: [birth year] DOA: 06/15/2021 PCP: [name], DO   Chief Complaint  Patient presents with   Generalized Body Aches   Brief Narrative: 80 year old female with hypertension, HLD, COPD, PVD, CKD stage IV, COVID-19 infection in 2021, dementia presents with pain all over x3 days along with a rash.  She was seen in the ED creatinine 3.1, troponin 31 chest x-ray left lower lobe collapse COVID-19 negative CT of the chest abdomen pelvis showed left lower lobe collapse without visible mass or adenopathy and femoral bypass graft with increased fluid collection and bypass anastomosis measuring 4.8 X4.1 previously 2.6 X2.6 Patient was given valacyclovir IV fluids and admitted for further management for dyspnea due to left lung collapse, hypertension urgency, weight loss, borderline elevated troponin, nongap metabolic acidosis, anemia. Patient was managed with Valtrex, pain control supplemental oxygen. Seen by PT OT and skilled nursing facility recommended.  Subjective:  This morning patient complains of pain on the left back at the site of rash.  Rash or crusting.  She is on 2 L nasal cannula saturating 96%'s. Leukocytosis uptrending, but afebrile.  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  Plan is to continue on pain control, supplemental oxygen, bronchodilators incentive spirometry.Pt was seen by pulm for same and they are planning to repeat CT chest and outpatient follow-up in a month-if still not reexpanded then may need inspection bronchoscopy.  Overall stable.  She will likely need to go SNF on oxygen.  Acute Shingles/herpes zoster viral: involving the left flank and left  abdomen: Rash/crusting healing but patient still complains of pain. cont valacyclovir,Cont pain management with Lyrica, capsicum cream, oxycodone, and a steroid taper.  Requested palliative medicine to reeval given ongoing pain  Leukocytosis:WBC count uptrending this morning, check procalcitonin and if up may need CT  chest-we will empirically start on antibiotics- unasyn, concerned abt pneumonia as she is not mobile and splinting on left chest. Check UA,She is on prednisone.monitor closely. Recent Labs  Lab 06/24/21 0840 06/25/21 0419 06/26/21 0630 06/27/21 0328 06/28/21 0802  WBC 11.7* 12.2* 12.8* 12.4* 19.8*      Hypertension urgency: BP controlled on amlodipine, Coreg, clonidine.  prn oral hydralazine.  CKD stage IV/metabolic  acidosis-previous creatinine around 2.6-2.9. Creatinine improved to 2.4. keep low rate ivf/Encourage oral intake.  Continue her oral bicarb. Recent Labs  Lab 06/24/21 0840 06/25/21 0419 06/26/21 0630 06/27/21 0328 06/28/21 0802  BUN 46* 50* 50* 55* 54*  CREATININE 2.98* 2.99* 2.73* 2.57* 2.43*    Hyperkalemia on Lokelma -Lokelma stopped potassium uptrending.  Resumed 9/11 Recent Labs  Lab 06/24/21 0840 06/25/21 0419 06/26/21 0630 06/27/21 0328 06/28/21 0802  K 4.4 4.4 4.5 5.0 5.2*     PVD- Fem fem bypass  in 2011/CT on admission showing fluid collection from pseudoaneurysm, seen by Dr. 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 she is on Plavix aspirin and statin appreciate input from vascular.  History of tobacco abuse-has quit few months ago Moderate protein calorie malnutrition: Augment diet as tolerated Dementia: Alert awake but not much communicative, able to express pain here. Cont fall precaution, aspiration precaution.  Continue delirium precaution.  Mood is stable.  Deconditioning: Cont PT OT and will need SNF   HLD Cont statin Anemia of chronic kidney disease stable  in 7- mid 8s g with range.  Monitor  Recent Labs  Lab 06/24/21 0840 06/25/21 0419 06/26/21 0630 06/27/21 0328 06/28/21 0802  HGB 8.4* 7.9* 8.4* 7.7* 8.1*  HCT 24.7* 23.2* 24.1* 22.9* 23.8*   Depression with anxiety-mood stable  Constipation: Dulcolax suppository and daily MiraLAX will be ordered as patient is getting pain meds.  Goals of care: DNR, seen by palliative care.  Patient does not have a good prognosis overall given multiple comorbidities.  Diet Order             Diet renal with fluid restriction Fluid restriction: 1200 mL Fluid; Room service appropriate? Yes; Fluid consistency: Thin  Diet effective now                   Nutrition Problem: Moderate Malnutrition Etiology: chronic illness (dementia, COPD) Signs/Symptoms: mild fat depletion, moderate fat depletion, mild muscle depletion, moderate muscle depletion Interventions: Ensure Enlive (each supplement provides 350kcal and 20 grams of protein), MVI, Liberalize Diet Patient's Body mass index is 19.48 kg/m.  DVT prophylaxis: heparin injection 5,000 Units Start: 06/16/21 0930 Code Status:   Code Status: DNR  Family Communication: plan of care discussed with patient, I had updated patient's son previously  Status is: Inpatient Remains inpatient appropriate because:Inpatient level of care appropriate due to severity of illness  Dispo:The patient is from: Home            Anticipated d/c is to: SNF  once available            Patient currently is not medically stable            Difficult to place patient No  Unresulted Labs (From admission, onward)    None      Medications reviewed:  Scheduled Meds:  acetaminophen  1,000 mg Oral TID   amLODipine  10 mg Oral QHS   arformoterol  15 mcg Nebulization BID   And   budesonide (PULMICORT) nebulizer solution  0.25 mg Nebulization BID   And   revefenacin  175 mcg Nebulization Daily   aspirin EC  81 mg  Oral Daily   capsicum   Topical TID   carvedilol  12.5 mg Oral BID   cloNIDine  0.2 mg Oral QHS   clopidogrel  75 mg Oral Daily   donepezil  5 mg Oral Q2200   DULoxetine  30 mg Oral Daily   feeding supplement  237 mL Oral TID BM   heparin  5,000 Units Subcutaneous Q8H   insulin aspart  0-5 Units Subcutaneous QHS   insulin aspart  0-6 Units Subcutaneous TID WC   megestrol  40 mg Oral BID   multivitamin with minerals  1 tablet Oral Daily   pantoprazole  40 mg Oral Daily   predniSONE  10 mg Oral Q breakfast   pregabalin  75 mg Oral Daily   rosuvastatin  10 mg Oral Daily   sodium bicarbonate  650 mg Oral BID   sodium chloride flush  3 mL Intravenous Q12H   Continuous Infusions:  lactated ringers 30 mL/hr at 06/27/21 1233   Consultants:see note  Procedures:see note Antimicrobials: Anti-infectives (From admission, onward)    Start     Dose/Rate Route Frequency Ordered Stop   06/17/21 0800  valACYclovir (VALTREX) tablet 1,000 mg  1,000 mg Oral Daily 06/16/21 0839 06/22/21 0900   06/16/21 0515  valACYclovir (VALTREX) tablet 1,000 mg        1,000 mg Oral STAT 06/16/21 0502 06/16/21 0543      Culture/Microbiology    Component Value Date/Time   SDES URINE, RANDOM 09/29/2020 2353   SPECREQUEST  09/29/2020 2353    NONE Performed at [facility], [place] [address], [place], [place] [address]    CULT 20,000 COLONIES/mL ENTEROBACTER AEROGENES (A) 09/29/2020 2353   REPTSTATUS 10/01/2020 FINAL 09/29/2020 2353    Other culture-see note  Objective: Vitals: Today's Vitals   06/27/21 2336 06/28/21 0648 06/28/21 0727 06/28/21 0814  BP:  (!) 152/91    Pulse:  81  79  Resp:  18  16  Temp:  98.5 F (36.9 C)    TempSrc:  Oral    SpO2:  96%  97%  Weight:      Height:      PainSc: Asleep 10-Worst pain ever 5      Intake/Output Summary (Last 24 hours) at 06/28/2021 1030 Last data filed at 06/28/2021 0600 Gross per 24 hour  Intake --  Output 1000 ml  Net -1000 ml     Filed Weights   06/19/21 0519 06/23/21 2137 06/25/21 2139  Weight: 49.9 kg 49.9 kg 49.9 kg   Weight change:   Intake/Output from previous day: 09/10 0701 - 09/11 0700 In: 0  Out: 1000 [Urine:1000] Intake/Output this shift: No intake/output data recorded. Filed Weights   06/19/21 0519 06/23/21 2137 06/25/21 2139  Weight: 49.9 kg 49.9 kg 49.9 kg   Examination: General exam: AAO, appears chronically ill, on nasal cannula, debilitated frail.  HEENT:Oral mucosa moist, Ear/Nose WNL grossly, dentition normal. Respiratory system: bilaterally diminished at the base, no use of accessory muscle Cardiovascular system: S1 & S2 +, No JVD,. Gastrointestinal system: Abdomen soft, NT,ND, BS+ Nervous System:Alert, awake, moving extremities and grossly nonfocal Extremities: no edema, distal peripheral pulses palpable.  Skin: No rashes,no icterus.  Crusted rash on left abdomen and left flank MSK: Normal muscle bulk,tone, power    Data Reviewed: I have personally reviewed following labs and imaging studies CBC: Recent Labs  Lab 06/21/21 1330 06/24/21 0840 06/25/21 0419 06/26/21 0630 06/27/21 0328 06/28/21 0802  WBC 7.6 11.7* 12.2* 12.8* 12.4* 19.8*  NEUTROABS 5.9  --   --   --   --   --   HGB 8.6* 8.4* 7.9* 8.4* 7.7* 8.1*  HCT 25.8* 24.7* 23.2* 24.1* 22.9* 23.8*  MCV 84.3 83.4 82.9 82.5 84.2 84.7  PLT 428* 438* 461* 487* 445* 463*    Basic Metabolic Panel: Recent Labs  Lab 06/21/21 1330 06/22/21 0229 06/23/21 0433 06/24/21 0840 06/25/21 0419 06/26/21 0630 06/27/21 0328 06/28/21 0802  NA 134*  132* 135   < > 133* 134* 136 139 139  K 6.3*  6.3* 4.4   < > 4.4 4.4 4.5 5.0 5.2*  CL 102  103 103   < > 100 100 106 110 111  CO2 19*  20* 24   < > 22 21* 22 21* 21*  GLUCOSE 256*  256* 150*   < > 166* 114* 103* 148* 120*  BUN 43*  43* 46*   < > 46* 50* 50* 55* 54*  CREATININE 3.07*  3.13* 3.02*   < > 2.98* 2.99* 2.73* 2.57* 2.43*  CALCIUM 9.1  9.1 8.5*   < > 8.8* 9.3 8.9  8.8* 9.0  PHOS 3.3 3.3  --   --   --   --   --   --    < > =  values in this interval not displayed.    GFR: Estimated Creatinine Clearance: 15.3 mL/min (A) (by C-G formula based on SCr of 2.43 mg/dL (H)). Liver Function Tests: Recent Labs  Lab 06/21/21 1330 06/22/21 0229  ALBUMIN 2.9* 2.5*    No results for input(s): LIPASE, AMYLASE in the last 168 hours. No results for input(s): AMMONIA in the last 168 hours. Coagulation Profile: No results for input(s): INR, PROTIME in the last 168 hours. Cardiac Enzymes: No results for input(s): CKTOTAL, CKMB, CKMBINDEX, TROPONINI in the last 168 hours. BNP (last 3 results) No results for input(s): PROBNP in the last 8760 hours. HbA1C: No results for input(s): HGBA1C in the last 72 hours.  CBG: Recent Labs  Lab 06/27/21 0703 06/27/21 1153 06/27/21 1706 06/27/21 2255 06/28/21 0642  GLUCAP 123* 173* 187* 134* 122*    Lipid Profile: No results for input(s): CHOL, HDL, LDLCALC, TRIG, CHOLHDL, LDLDIRECT in the last 72 hours. Thyroid Function Tests: No results for input(s): TSH, T4TOTAL, FREET4, T3FREE, THYROIDAB in the last 72 hours. Anemia Panel: No results for input(s): VITAMINB12, FOLATE, FERRITIN, TIBC, IRON, RETICCTPCT in the last 72 hours. Sepsis Labs: No results for input(s): PROCALCITON, LATICACIDVEN in the last 168 hours.  No results found for this or any previous visit (from the past 240 hour(s)).    Radiology Studies: No results found.   LOS: 9 days   Antonieta Pert, MD [facility]  06/28/2021, 10:30 AM

## 2021-06-29 ENCOUNTER — Encounter (HOSPITAL_COMMUNITY): Payer: Self-pay | Admitting: Anesthesiology

## 2021-06-29 DIAGNOSIS — J9811 Atelectasis: Secondary | ICD-10-CM

## 2021-06-29 DIAGNOSIS — B029 Zoster without complications: Secondary | ICD-10-CM | POA: Diagnosis not present

## 2021-06-29 DIAGNOSIS — R0602 Shortness of breath: Secondary | ICD-10-CM | POA: Diagnosis not present

## 2021-06-29 DIAGNOSIS — R52 Pain, unspecified: Secondary | ICD-10-CM | POA: Diagnosis not present

## 2021-06-29 LAB — CBC
HCT: 22.5 % — ABNORMAL LOW (ref 36.0–46.0)
Hemoglobin: 7.6 g/dL — ABNORMAL LOW (ref 12.0–15.0)
MCH: 29 pg (ref 26.0–34.0)
MCHC: 33.8 g/dL (ref 30.0–36.0)
MCV: 85.9 fL (ref 80.0–100.0)
Platelets: 421 10*3/uL — ABNORMAL HIGH (ref 150–400)
RBC: 2.62 MIL/uL — ABNORMAL LOW (ref 3.87–5.11)
RDW: 18.3 % — ABNORMAL HIGH (ref 11.5–15.5)
WBC: 22.6 10*3/uL — ABNORMAL HIGH (ref 4.0–10.5)
nRBC: 0 % (ref 0.0–0.2)

## 2021-06-29 LAB — GLUCOSE, CAPILLARY
Glucose-Capillary: 120 mg/dL — ABNORMAL HIGH (ref 70–99)
Glucose-Capillary: 287 mg/dL — ABNORMAL HIGH (ref 70–99)
Glucose-Capillary: 165 mg/dL — ABNORMAL HIGH (ref 70–99)
Glucose-Capillary: 143 mg/dL — ABNORMAL HIGH (ref 70–99)

## 2021-06-29 LAB — BASIC METABOLIC PANEL
Anion gap: 12 (ref 5–15)
BUN: 52 mg/dL — ABNORMAL HIGH (ref 8–23)
CO2: 19 mmol/L — ABNORMAL LOW (ref 22–32)
Calcium: 8.9 mg/dL (ref 8.9–10.3)
Chloride: 111 mmol/L (ref 98–111)
Creatinine, Ser: 2.42 mg/dL — ABNORMAL HIGH (ref 0.44–1.00)
GFR, Estimated: 20 mL/min — ABNORMAL LOW (ref >60)
Glucose, Bld: 214 mg/dL — ABNORMAL HIGH (ref 70–99)
Potassium: 5.1 mmol/L (ref 3.5–5.1)
Sodium: 142 mmol/L (ref 135–145)

## 2021-06-29 LAB — PROCALCITONIN: Procalcitonin: 0.28 ng/mL

## 2021-06-29 MED ORDER — ACETAMINOPHEN 500 MG PO TABS
1000.0000 mg | ORAL_TABLET | Freq: Three times a day (TID) | ORAL | Status: DC
  Administered 2021-06-29 – 2021-07-03 (×11): 1000 mg via ORAL
  Filled 2021-06-29 (×11): qty 2

## 2021-06-29 MED ORDER — OXYCODONE HCL 5 MG PO TABS
10.0000 mg | ORAL_TABLET | Freq: Three times a day (TID) | ORAL | Status: DC
  Administered 2021-06-29 – 2021-07-03 (×11): 10 mg via ORAL
  Filled 2021-06-29 (×11): qty 2

## 2021-06-29 MED ORDER — OXYCODONE HCL 5 MG PO TABS
10.0000 mg | ORAL_TABLET | ORAL | Status: DC | PRN
  Administered 2021-06-30 – 2021-07-07 (×3): 10 mg via ORAL
  Filled 2021-06-29 (×3): qty 2

## 2021-06-29 MED ORDER — CAPSAICIN 0.025 % EX CREA
TOPICAL_CREAM | Freq: Two times a day (BID) | CUTANEOUS | Status: DC
  Filled 2021-06-29 (×2): qty 60

## 2021-06-29 NOTE — Progress Notes (Signed)
Interventional Radiology Brief Note:  IR consulted for possible nerve block at T8/T9 due to severe pain.  Thus far, have not identified an IR or fluoro MD who performs at this level.  Notified ordering service.  Will cancel IR order.   Brynda Greathouse, MS RD PA-C

## 2021-06-29 NOTE — Progress Notes (Signed)
PROGRESS NOTE    Alyssa [name]  MGQ:[id number] DOB: [birth year] DOA: 06/15/2021 PCP: [name], DO   Chief Complaint  Patient presents with   Generalized Body Aches   Brief Narrative: 80 year old female with hypertension, HLD, COPD, PVD, CKD stage IV, COVID-19 infection in 2021, dementia presents with pain all over x3 days along with a rash.  She was seen in the ED creatinine 3.1, troponin 31 chest x-ray left lower lobe collapse COVID-19 negative CT of the chest abdomen pelvis showed left lower lobe collapse without visible mass or adenopathy and femoral bypass graft with increased fluid collection and bypass anastomosis measuring 4.8 X4.1 previously 2.6 X2.6 Patient was given valacyclovir IV fluids and admitted for further management for dyspnea due to left lung collapse, hypertension urgency, weight loss, borderline elevated troponin, nongap metabolic acidosis, anemia. Patient was managed with Valtrex, pain control supplemental oxygen. Seen by PT OT and skilled nursing facility recommended.  Subjective:  Patient was complaining of pain this morning but her pain meds were reduced.  Has had loose dark stool.   On room air saturating at 94%.  Denies any shortness of breath fever chills.   WBC counts trending up.    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  Plan is to continue on pain control, supplemental oxygen, bronchodilators incentive spirometry.Pt was seen by pulm for same and they are planning to repeat CT chest and outpatient follow-up in a month-if still not reexpanded then may need inspection bronchoscopy.  Respiratory status overall stable but with worsening WBC count monitoring closely.  Pro-Cal borderline  0.28, at risk of developing pneumonia and antibiotic has been started.   Acute Shingles/herpes zoster viral: involving the left flank and left abdomen: Rash/crusting healing but patient still complains of pain.  Rash crusting.  Palliative care input appreciated for pain management again continue Lyrica 75 mg, increase Cymbalta to 40 mg p.o. daily, Tylenol 1 g p.o. 3 times daily, capsicum cream discontinued, started on lidocaine patches, oxycodone 5 mg 3 times daily and oxycodone 5/7.5 every 4 hours as needed for pain.  IR unable to do nerve blocks here.  Also on prednisone continue the same  Leukocytosis:WBC count uptrending further procalcitonin 0.28, afebrile, patient has been started on Unasyn at risk of pneumonia.  Also on steroids which could be contributing to some of the leukocytosis.   Recent Labs  Lab 06/25/21 0419 06/26/21 0630 06/27/21 0328 06/28/21 0802 06/28/21 1100 06/29/21 0120  WBC 12.2* 12.8* 12.4* 19.8*  --  22.6*  PROCALCITON  --   --   --   --  0.24 0.28     Hypertension urgency: BP controlled on amlodipine, Coreg, clonidine.  prn oral hydralazine.  CKD stage IV/metabolic  acidosis-previous creatinine around 2.6-2.9. Creatinine improved to 2.4-holding stable keep on gentle IV fluid hydration continue oral bicarb. Recent Labs  Lab 06/25/21 0419 06/26/21 0630 06/27/21 0328 06/28/21 0802 06/29/21 0120  BUN 50* 50* 55* 54* 52*  CREATININE 2.99* 2.73* 2.57* 2.43* 2.42*    Hyperkalemia on Lokelma continue as potassium is not downtrending.  Did not tolerate discontinuing Lokelma as potassium started to uptrend. Recent Labs  Lab 06/25/21 0419 06/26/21 0630 06/27/21 0328 06/28/21 0802 06/29/21 0120  K 4.4 4.5 5.0 5.2* 5.1  PVD- Fem fem bypass  in 2011/CT on admission showing fluid collection from pseudoaneurysm, seen by Dr. 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 she is on Plavix aspirin and statin appreciate input from vascular.  History of tobacco abuse-has quit few months ago Moderate protein calorie malnutrition: Augment diet as tolerated Dementia: Alert awake appears to be close to baseline.  Continue delirium precaution, fall precaution and supportive care.    Deconditioning: Cont PT OT and will need SNF  HLD Cont statin Anemia of chronic kidney disease stable  in 7- mid 8s g , MONITOR Recent Labs  Lab 06/25/21 0419 06/26/21 0630 06/27/21 0328 06/28/21 0802 06/29/21 0120  HGB 7.9* 8.4* 7.7* 8.1* 7.6*  HCT 23.2* 24.1* 22.9* 23.8* 22.5*   Depression with anxiety-mood stable  Constipation: Dulcolax suppository and daily MiraLAX will be ordered as patient is getting pain meds.  Goals of care: DNR, seen by palliative care.  Prognosis does not appear bright in this patient with multiple comorbidities.    Diet Order             Diet renal with fluid restriction Fluid restriction: 1200 mL Fluid; Room service appropriate? Yes; Fluid consistency: Thin  Diet effective now                   Nutrition Problem: Moderate Malnutrition Etiology: chronic illness (dementia, COPD) Signs/Symptoms: mild fat depletion, moderate fat depletion, mild muscle depletion, moderate muscle depletion Interventions: Ensure Enlive (each supplement provides 350kcal and 20 grams of protein), MVI, Liberalize Diet Patient's Body mass index is 19.49 kg/m.  DVT prophylaxis: heparin injection 5,000 Units Start: 06/16/21 0930 Code Status:   Code Status: DNR  Family Communication: plan of care discussed with patient, I had updated patient's son previously  Status is: Inpatient Remains inpatient appropriate because:Inpatient level of care appropriate due to severity of illness  Dispo:The patient is from: Home            Anticipated d/c is to: SNF  once available            Patient currently is not medically stable             Difficult to place patient No  Unresulted Labs (From admission, onward)     Start     Ordered   06/29/21 0500  Procalcitonin  Daily,   R     Question:  Specimen collection method  Answer:  Lab=Lab collect   06/28/21 1036   06/29/21 1950  Basic metabolic panel  Daily,   R     Question:  Specimen collection method  Answer:  Lab=Lab collect   06/28/21 1037   06/29/21 0500  CBC  Daily,   R     Question:  Specimen collection method  Answer:  Lab=Lab collect   06/28/21 1037           Medications reviewed:  Scheduled Meds:  oxyCODONE  5 mg Oral Q8H   And   acetaminophen  1,000 mg Oral Q8H   amLODipine  10 mg Oral QHS   arformoterol  15 mcg Nebulization BID   And   budesonide (PULMICORT) nebulizer solution  0.25 mg Nebulization BID   And   revefenacin  175 mcg Nebulization Daily   aspirin EC  81 mg Oral Daily   carvedilol  12.5 mg Oral BID   cloNIDine  0.2 mg  Oral QHS   clopidogrel  75 mg Oral Daily   donepezil  5 mg Oral Q2200   DULoxetine  40 mg Oral Daily   feeding supplement  237 mL Oral TID BM   heparin  5,000 Units Subcutaneous Q8H   insulin aspart  0-5 Units Subcutaneous QHS   insulin aspart  0-6 Units Subcutaneous TID WC   lidocaine  2 patch Transdermal Q24H   megestrol  40 mg Oral BID   multivitamin with minerals  1 tablet Oral Daily   pantoprazole  40 mg Oral Daily   polyethylene glycol  17 g Oral Daily   predniSONE  10 mg Oral Q breakfast   pregabalin  75 mg Oral Daily   rosuvastatin  10 mg Oral Daily   sodium bicarbonate  650 mg Oral BID   sodium chloride flush  3 mL Intravenous Q12H   sodium zirconium cyclosilicate  10 g Oral Daily   Continuous Infusions:  ampicillin-sulbactam (UNASYN) IV 1.5 g (06/29/21 1009)   lactated ringers 30 mL/hr at 06/29/21 1315   Consultants:see note  Procedures:see note Antimicrobials: Anti-infectives (From admission, onward)    Start     Dose/Rate Route Frequency Ordered Stop   06/28/21 1230  ampicillin-sulbactam (UNASYN)  1.5 g in sodium chloride 0.9 % 100 mL IVPB        1.5 g 200 mL/hr over 30 Minutes Intravenous Every 12 hours 06/28/21 1127     06/17/21 0800  valACYclovir (VALTREX) tablet 1,000 mg        1,000 mg Oral Daily 06/16/21 0839 06/22/21 0900   06/16/21 0515  valACYclovir (VALTREX) tablet 1,000 mg        1,000 mg Oral STAT 06/16/21 0502 06/16/21 0543      Culture/Microbiology    Component Value Date/Time   SDES URINE, RANDOM 09/29/2020 2353   SPECREQUEST  09/29/2020 2353    NONE Performed at [facility], [place] [address], [place], [place] [address]    CULT 20,000 COLONIES/mL ENTEROBACTER AEROGENES (A) 09/29/2020 2353   REPTSTATUS 10/01/2020 FINAL 09/29/2020 2353    Other culture-see note  Objective: Vitals: Today's Vitals   06/29/21 0100 06/29/21 0550 06/29/21 1005 06/29/21 1017  BP:  (!) 156/81 (!) 148/103   Pulse:  80 83   Resp:  16 16   Temp:  98.1 F (36.7 C) 98.6 F (37 C)   TempSrc:  Oral Oral   SpO2:  100%    Weight:    49.9 kg  Height:      PainSc: Asleep   9     Intake/Output Summary (Last 24 hours) at 06/29/2021 1346 Last data filed at 06/29/2021 0953 Gross per 24 hour  Intake 900 ml  Output 1500 ml  Net -600 ml    Filed Weights   06/23/21 2137 06/25/21 2139 06/29/21 1017  Weight: 49.9 kg 49.9 kg 49.9 kg   Weight change:   Intake/Output from previous day: 09/11 0701 - 09/12 0700 In: 600 [P.O.:600] Out: 1500 [Urine:1500] Intake/Output this shift: Total I/O In: 600 [P.O.:600] Out: -  Filed Weights   06/23/21 2137 06/25/21 2139 06/29/21 1017  Weight: 49.9 kg 49.9 kg 49.9 kg   Examination: General exam: AAO, communicative interactive, not in distress but in pain.  HEENT:Oral mucosa moist, Ear/Nose WNL grossly, dentition normal. Respiratory system: bilaterally hearing present mildly diminished at the left base, no use of accessory muscle Cardiovascular system: S1 & S2 +, No JVD,. Gastrointestinal system: Abdomen soft,NT,ND, BS+ Nervous  System:Alert,  awake, moving extremities and grossly nonfocal Extremities: No edema, chronic hyperpigmented lower extremities skin, distal peripheral pulses palpable.  Skin: No rashes,no icterus. MSK: Normal muscle bulk,tone, power    Data Reviewed: I have personally reviewed following labs and imaging studies CBC: Recent Labs  Lab 06/25/21 0419 06/26/21 0630 06/27/21 0328 06/28/21 0802 06/29/21 0120  WBC 12.2* 12.8* 12.4* 19.8* 22.6*  HGB 7.9* 8.4* 7.7* 8.1* 7.6*  HCT 23.2* 24.1* 22.9* 23.8* 22.5*  MCV 82.9 82.5 84.2 84.7 85.9  PLT 461* 487* 445* 463* 421*    Basic Metabolic Panel: Recent Labs  Lab 06/25/21 0419 06/26/21 0630 06/27/21 0328 06/28/21 0802 06/29/21 0120  NA 134* 136 139 139 142  K 4.4 4.5 5.0 5.2* 5.1  CL 100 106 110 111 111  CO2 21* 22 21* 21* 19*  GLUCOSE 114* 103* 148* 120* 214*  BUN 50* 50* 55* 54* 52*  CREATININE 2.99* 2.73* 2.57* 2.43* 2.42*  CALCIUM 9.3 8.9 8.8* 9.0 8.9    GFR: Estimated Creatinine Clearance: 15.3 mL/min (A) (by C-G formula based on SCr of 2.42 mg/dL (H)). Liver Function Tests: No results for input(s): AST, ALT, ALKPHOS, BILITOT, PROT, ALBUMIN in the last 168 hours.  No results for input(s): LIPASE, AMYLASE in the last 168 hours. No results for input(s): AMMONIA in the last 168 hours. Coagulation Profile: No results for input(s): INR, PROTIME in the last 168 hours. Cardiac Enzymes: No results for input(s): CKTOTAL, CKMB, CKMBINDEX, TROPONINI in the last 168 hours. BNP (last 3 results) No results for input(s): PROBNP in the last 8760 hours. HbA1C: No results for input(s): HGBA1C in the last 72 hours.  CBG: Recent Labs  Lab 06/28/21 1217 06/28/21 1717 06/28/21 2204 06/29/21 0636 06/29/21 1214  GLUCAP 117* 184* 160* 120* 287*    Lipid Profile: No results for input(s): CHOL, HDL, LDLCALC, TRIG, CHOLHDL, LDLDIRECT in the last 72 hours. Thyroid Function Tests: No results for input(s): TSH, T4TOTAL, FREET4, T3FREE,  THYROIDAB in the last 72 hours. Anemia Panel: No results for input(s): VITAMINB12, FOLATE, FERRITIN, TIBC, IRON, RETICCTPCT in the last 72 hours. Sepsis Labs: Recent Labs  Lab 06/28/21 1100 06/29/21 0120  PROCALCITON 0.24 0.28    No results found for this or any previous visit (from the past 240 hour(s)).    Radiology Studies: No results found.   LOS: 10 days   Antonieta Pert, MD [facility]  06/29/2021, 1:46 PM

## 2021-06-29 NOTE — Progress Notes (Signed)
Called to see patient for severe LEFT chest pain secondary to shingles.   On exam she has extensive lesion that runs from mid-spine anterior onto abdomen. On palpation of her back it appears to be lower than T9 and may be as low as T11. Imaging shows atelectasis of Left lung.   I could easily palpate the angle of the rib and felt it to be T11, I injected 5 cc or 0.5% ropivacaine after carefull aspiration.   I had a long discussion with her son and explained the difficulty of managing this phase of herpes zoster, he was appreciative and understood. If she obtains some relief we could consider repeat injection under [place] with the addition of steroids. EJ Rebekah Chesterfield md.

## 2021-06-29 NOTE — Progress Notes (Signed)
Daily Progress Note   [name]       Date: 06/29/2021 DOB: December 01, 1940  Age: 80 y.o. MRN#: [id number] Attending Physician: Antonieta Pert, MD Primary Care Physician: [name], DO Admit Date: 06/15/2021  Reason for Consultation/Follow-up: Pain control     Patient sleeping did not wake.    Spoke with RN who tells me patient's pain has consistently be a 9 despite lidocaine patches, oxycodone, lyrica, cymbalta.   Consulted IR for a possible intercostal nerve block in left chest as pain is so severe patient is unable to take a deep breath.  Concern for becoming hypoxic and developing atelectasis.  No IR provider available who is able to do that procedure.  Contacted Anesthesia for their consideration of the situation.  Dr. Royce Macadamia said they would evaluate for possible intercostal nerve block.  Assessment: Patient has used 3 oxycodone PRNs in 24 hours and has remained in pain.  Will increase scheduled dose of oxy to 10 mg and change PRN dose for severe pain to 10 mg. Per RN capsaisin cream helped more than lidocaine patches - so will switch back.     Patient Profile/HPI:  Palliative Care consult requested for symptom management (Pain) in this 80 y.o. female  with past medical history of anxiety, CKD IV, COPD, tobaccos use, hypertension, hyperlipidemia, PVD, COVID-19 (05/2020). She was admitted on 06/15/2021 from home with herpes zoster, hypertension, and generalized body aches. Chest x-ray showed left lung collapse, no evidence of obstruction.     Length of Stay: 10   Vital Signs: BP (!) 148/103 (BP Location: Left Leg)   Pulse 83   Temp 98.6 F (37 C) (Oral)   Resp 16   Ht 5\' 3"  (1.6 m)   Wt 49.9 kg   SpO2 100%   BMI 19.49 kg/m  SpO2: SpO2: 100 % O2 Device: O2  Device: Room Air O2 Flow Rate: O2 Flow Rate (L/min): 2 L/min    Palliative Care Plan    Recommendations/Plan: Consulted Anesthesia to evaluate for intercostal nerve block. Increased scheduled oxy to 10mg  q 8 hours Increased PRN oxy to 10mg  for severe pain. Switched back from lidocaine patches to capsaicin cream  Code Status:  DNR  Prognosis:  Unable to determine   Discharge Planning: To Be Determined   Thank you for allowing  the Palliative Medicine Team to assist in the care of this patient.  Total time spent:  35 min.     Greater than 50%  of this time was spent counseling and coordinating care related to the above assessment and plan.  Florentina Jenny, PA-C Palliative Medicine  Please contact Palliative MedicineTeam phone at [phone] for questions and concerns between 7 am - 7 pm.   Please see AMION for individual provider pager numbers.

## 2021-06-30 DIAGNOSIS — Z7189 Other specified counseling: Secondary | ICD-10-CM | POA: Diagnosis not present

## 2021-06-30 DIAGNOSIS — R0602 Shortness of breath: Secondary | ICD-10-CM | POA: Diagnosis not present

## 2021-06-30 DIAGNOSIS — Z515 Encounter for palliative care: Secondary | ICD-10-CM | POA: Diagnosis not present

## 2021-06-30 LAB — CBC
HCT: 24.2 % — ABNORMAL LOW (ref 36.0–46.0)
Hemoglobin: 8 g/dL — ABNORMAL LOW (ref 12.0–15.0)
MCH: 28.2 pg (ref 26.0–34.0)
MCHC: 33.1 g/dL (ref 30.0–36.0)
MCV: 85.2 fL (ref 80.0–100.0)
Platelets: 436 10*3/uL — ABNORMAL HIGH (ref 150–400)
RBC: 2.84 MIL/uL — ABNORMAL LOW (ref 3.87–5.11)
RDW: 18.6 % — ABNORMAL HIGH (ref 11.5–15.5)
WBC: 26.4 10*3/uL — ABNORMAL HIGH (ref 4.0–10.5)
nRBC: 0 % (ref 0.0–0.2)

## 2021-06-30 LAB — GLUCOSE, CAPILLARY
Glucose-Capillary: 144 mg/dL — ABNORMAL HIGH (ref 70–99)
Glucose-Capillary: 154 mg/dL — ABNORMAL HIGH (ref 70–99)
Glucose-Capillary: 141 mg/dL — ABNORMAL HIGH (ref 70–99)
Glucose-Capillary: 227 mg/dL — ABNORMAL HIGH (ref 70–99)

## 2021-06-30 LAB — BASIC METABOLIC PANEL
Anion gap: 8 (ref 5–15)
BUN: 46 mg/dL — ABNORMAL HIGH (ref 8–23)
CO2: 21 mmol/L — ABNORMAL LOW (ref 22–32)
Calcium: 9.1 mg/dL (ref 8.9–10.3)
Chloride: 113 mmol/L — ABNORMAL HIGH (ref 98–111)
Creatinine, Ser: 2.36 mg/dL — ABNORMAL HIGH (ref 0.44–1.00)
GFR, Estimated: 21 mL/min — ABNORMAL LOW (ref >60)
Glucose, Bld: 124 mg/dL — ABNORMAL HIGH (ref 70–99)
Potassium: 5.1 mmol/L (ref 3.5–5.1)
Sodium: 142 mmol/L (ref 135–145)

## 2021-06-30 LAB — PROCALCITONIN: Procalcitonin: 0.39 ng/mL

## 2021-06-30 MED ORDER — CAPSAICIN 0.025 % EX CREA
TOPICAL_CREAM | Freq: Three times a day (TID) | CUTANEOUS | Status: DC
  Filled 2021-06-30: qty 60

## 2021-06-30 MED ORDER — PIPERACILLIN-TAZOBACTAM IN DEX 2-0.25 GM/50ML IV SOLN
2.2500 g | Freq: Three times a day (TID) | INTRAVENOUS | Status: AC
Start: 2021-06-30 — End: 2021-07-04
  Administered 2021-06-30 – 2021-07-04 (×14): 2.25 g via INTRAVENOUS
  Filled 2021-06-30 (×14): qty 50

## 2021-06-30 NOTE — Progress Notes (Signed)
PROGRESS NOTE    Alyssa [name]  JKD:[id number] DOB: December 16, 1940 DOA: 06/15/2021 PCP: [name], DO   Chief Complaint  Patient presents with   Generalized Body Aches   Brief Narrative: 80 year old female with hypertension, HLD, COPD, PVD, CKD stage IV, COVID-19 infection in 2021, dementia presents with pain all over x3 days along with a rash.  She was seen in the ED creatinine 3.1, troponin 31 chest x-ray left lower lobe collapse COVID-19 negative CT of the chest abdomen pelvis showed left lower lobe collapse without visible mass or adenopathy and femoral bypass graft with increased fluid collection and bypass anastomosis measuring 4.8 X4.1 previously 2.6 X2.6 Patient was given valacyclovir IV fluids and admitted for further management for dyspnea due to left lung collapse, hypertension urgency, weight loss, borderline elevated troponin, nongap metabolic acidosis, anemia. Patient was managed with Valtrex, pain control supplemental oxygen. Seen by PT OT and skilled nursing facility recommended. Pain medication adjusted for pain control ,IR consulted but unable to do nerve block. 9/12-anesthesia was able to inject ropivacaine at T11 level  Subjective: Seen this morning she is resting comfortably on the bedside chair.  She reports she feels much better after nerve block last night. Overnight remains afebrile, intermittently on 2 L nasal cannula vitals stable WBC count further trending up procalcitonin 0.3 renal function remained stable  Assessment & Plan:  Acute hypoxic respiratory failure Dyspnea Left lower lobe collapse Chronic COPD Patient presentation felt to be in the setting of chest wall pain from herpes leading to splinting  on left chest/diminished respiratory effort causing left lower lobe collapse and thus hypoxia and dyspnea.  Continue to optimize pain control, continue supplemental oxygen, bronchodilators PT OT. Pt was seen by pulm for same and they are planning to  repeat CT chest and outpatient follow-up in a month-if still not reexpanded then may need inspection bronchoscopy.  Respiratory status overall stable but with worsening WBC count monitoring closely.  Pro-Cal borderline 0.28, at risk of developing pneumonia and antibiotic has been started.    Acute Shingles/herpes zoster viral involving up to T11 dermatome involving the left flank and left abdomen: Rash/crusting but still with ongoing pain issues despite on Lyrica Cymbalta scheduled Tylenol, oxycodone scheduled/ prn Percocet.  Apprecaite anesthesia inputs-  s/p  nerve block w/ rupivacaine injection at T11. S/p valtrex. Also on prednisone continue the same with taper.  Leukocytosis:WBC count uptrending further , procalcitonin 0.3, fortunately remains afebrile, suspect in the setting of her atelectasis, at risk of pneumonia.  Continue Unasyn empirically.  I will send blood cultures today.  She is on oral steroid which could be contributing .  We will change different antibiotics today. Recent Labs  Lab 06/26/21 0630 06/27/21 0328 06/28/21 0802 06/28/21 1100 06/29/21 0120 06/30/21 0434  WBC 12.8* 12.4* 19.8*  --  22.6* 26.4*  PROCALCITON  --   --   --  0.24 0.28 0.39     Hypertension urgency: BP fairly controlled on amlodipine, Coreg, clonidine.  prn oral hydralazine.  CKD stage IV/metabolic  acidosis-previous creatinine around 2.6-2.9. Creatinine has stabilized  to baseline. On gentle IV fluid hydration -we will discontinue, continue oral bicarb. Recent Labs  Lab 06/26/21 0630 06/27/21 0328 06/28/21 0802 06/29/21 0120 06/30/21 0434  BUN 50* 55* 54* 52* 46*  CREATININE 2.73* 2.57* 2.43* 2.42* 2.36*    Hyperkalemia on Lokelma scheduled,  potassium remains on higher side monitor K level  Recent Labs  Lab 06/26/21 0630 06/27/21 0328 06/28/21 0802 06/29/21 0120 06/30/21 0434  K 4.5 5.0 5.2* 5.1 5.1     PVD- Fem fem bypass  in 2011/CT on admission showing fluid collection from  pseudoaneurysm, seen by Dr. 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 she is on Plavix aspirin and statin appreciate input from vascular.  History of tobacco abuse-has quit few months ago Moderate protein calorie malnutrition: Augment diet as tolerated Dementia: Alert awake appears to be close to baseline.  Continue delirium precaution, fall precaution and supportive care.    Deconditioning: Cont PT OT and will need SNF  HLD Cont statin Anemia of chronic kidney disease stable  in 7- mid 8s g , cont to monitor Recent Labs  Lab 06/26/21 0630 06/27/21 0328 06/28/21 0802 06/29/21 0120 06/30/21 0434  HGB 8.4* 7.7* 8.1* 7.6* 8.0*  HCT 24.1* 22.9* 23.8* 22.5* 24.2*   Depression with anxiety-mood stable  Constipation: Dulcolax suppository and daily MiraLAX will be ordered as patient is getting pain meds.  Goals of care: DNR, seen by palliative care.  Prognosis does not appear bright in this patient with multiple comorbidities.    Diet Order             Diet renal with fluid restriction Fluid restriction: 1200 mL Fluid; Room service appropriate? Yes; Fluid consistency: Thin  Diet effective now                   Nutrition Problem: Moderate Malnutrition Etiology: chronic illness (dementia, COPD) Signs/Symptoms: mild fat depletion, moderate fat depletion, mild muscle depletion, moderate muscle depletion Interventions: Ensure Enlive (each supplement provides 350kcal and 20 grams of protein), MVI, Liberalize Diet Patient's Body mass index is 19.49 kg/m.  DVT prophylaxis: heparin injection 5,000 Units Start: 06/16/21 0930 Code Status:   Code Status: DNR  Family Communication: plan of care discussed with patient, I had updated patient's son .  Status is: Inpatient Remains inpatient appropriate because:Inpatient level of care appropriate due to severity of  illness  Dispo:The patient is from: Home            Anticipated d/c is to: SNF  once available            Patient currently is not medically stable            Difficult to place patient No  Unresulted Labs (From admission, onward)     Start     Ordered   06/29/21 3716  Basic metabolic panel  Daily,   R     Question:  Specimen collection method  Answer:  Lab=Lab collect   06/28/21 1037   06/29/21 0500  CBC  Daily,   R     Question:  Specimen collection method  Answer:  Lab=Lab collect   06/28/21 1037           Medications reviewed:  Scheduled Meds:  oxyCODONE  10 mg Oral Q8H   And   acetaminophen  1,000 mg Oral Q8H   amLODipine  10 mg Oral QHS   arformoterol  15 mcg Nebulization BID   And   budesonide (PULMICORT) nebulizer solution  0.25 mg Nebulization BID   And   revefenacin  175 mcg Nebulization Daily   aspirin EC  81 mg Oral Daily   capsaicin   Topical BID   carvedilol  12.5 mg Oral BID   cloNIDine  0.2 mg Oral QHS   clopidogrel  75 mg Oral Daily  donepezil  5 mg Oral Q2200   DULoxetine  40 mg Oral Daily   feeding supplement  237 mL Oral TID BM   heparin  5,000 Units Subcutaneous Q8H   insulin aspart  0-5 Units Subcutaneous QHS   insulin aspart  0-6 Units Subcutaneous TID WC   lidocaine  2 patch Transdermal Q24H   megestrol  40 mg Oral BID   multivitamin with minerals  1 tablet Oral Daily   pantoprazole  40 mg Oral Daily   polyethylene glycol  17 g Oral Daily   predniSONE  10 mg Oral Q breakfast   pregabalin  75 mg Oral Daily   rosuvastatin  10 mg Oral Daily   sodium bicarbonate  650 mg Oral BID   sodium chloride flush  3 mL Intravenous Q12H   sodium zirconium cyclosilicate  10 g Oral Daily   Continuous Infusions:  ampicillin-sulbactam (UNASYN) IV 1.5 g (06/29/21 2133)   lactated ringers 30 mL/hr at 06/29/21 1315   Consultants:see note  Procedures:see note Antimicrobials: Anti-infectives (From admission, onward)    Start     Dose/Rate Route  Frequency Ordered Stop   06/28/21 1230  ampicillin-sulbactam (UNASYN) 1.5 g in sodium chloride 0.9 % 100 mL IVPB        1.5 g 200 mL/hr over 30 Minutes Intravenous Every 12 hours 06/28/21 1127     06/17/21 0800  valACYclovir (VALTREX) tablet 1,000 mg        1,000 mg Oral Daily 06/16/21 0839 06/22/21 0900   06/16/21 0515  valACYclovir (VALTREX) tablet 1,000 mg        1,000 mg Oral STAT 06/16/21 0502 06/16/21 0543      Culture/Microbiology    Component Value Date/Time   SDES URINE, RANDOM 09/29/2020 2353   SPECREQUEST  09/29/2020 2353    NONE Performed at [facility], [place] [address], [place], [place] [address]    CULT 20,000 COLONIES/mL ENTEROBACTER AEROGENES (A) 09/29/2020 2353   REPTSTATUS 10/01/2020 FINAL 09/29/2020 2353    Other culture-see note  Objective: Vitals: Today's Vitals   06/29/21 1954 06/29/21 2124 06/29/21 2141 06/30/21 0535  BP:   (!) 165/98 (!) 146/95  Pulse:   78 65  Resp:   18 18  Temp:   97.6 F (36.4 C) 97.7 F (36.5 C)  TempSrc:   Oral Oral  SpO2: 98%  94% 100%  Weight:   49.9 kg   Height:      PainSc:  0-No pain      Intake/Output Summary (Last 24 hours) at 06/30/2021 0731 Last data filed at 06/30/2021 0553 Gross per 24 hour  Intake 1017 ml  Output 650 ml  Net 367 ml    Filed Weights   06/25/21 2139 06/29/21 1017 06/29/21 2141  Weight: 49.9 kg 49.9 kg 49.9 kg   Weight change:   Intake/Output from previous day: 09/12 0701 - 09/13 0700 In: 1017 [P.O.:1017] Out: 650 [Urine:650] Intake/Output this shift: No intake/output data recorded. Filed Weights   06/25/21 2139 06/29/21 1017 06/29/21 2141  Weight: 49.9 kg 49.9 kg 49.9 kg   Examination: General exam: AAO, oriented at baseline, in pain some, not in distress, on 2 L regular.  She is saturating 96% room air. HEENT:Oral mucosa moist, Ear/Nose WNL grossly, dentition normal. Respiratory system: Diminished at the left base bilateral air were equally present on the upper lungs, no  use of accessory muscle Cardiovascular system: S1 & S2 +, No JVD,. Gastrointestinal system: Abdomen soft, NT,ND, BS+ Nervous  System:Alert, awake, moving extremities and grossly nonfocal Extremities: no edema, distal peripheral pulses palpable.  Skin: No rashes,no icterus.  Healing/crusting shingles on left anterior abdomen and flank/ left back MSK: Normal muscle bulk,tone, power    Data Reviewed: I have personally reviewed following labs and imaging studies CBC: Recent Labs  Lab 06/26/21 0630 06/27/21 0328 06/28/21 0802 06/29/21 0120 06/30/21 0434  WBC 12.8* 12.4* 19.8* 22.6* 26.4*  HGB 8.4* 7.7* 8.1* 7.6* 8.0*  HCT 24.1* 22.9* 23.8* 22.5* 24.2*  MCV 82.5 84.2 84.7 85.9 85.2  PLT 487* 445* 463* 421* 436*    Basic Metabolic Panel: Recent Labs  Lab 06/26/21 0630 06/27/21 0328 06/28/21 0802 06/29/21 0120 06/30/21 0434  NA 136 139 139 142 142  K 4.5 5.0 5.2* 5.1 5.1  CL 106 110 111 111 113*  CO2 22 21* 21* 19* 21*  GLUCOSE 103* 148* 120* 214* 124*  BUN 50* 55* 54* 52* 46*  CREATININE 2.73* 2.57* 2.43* 2.42* 2.36*  CALCIUM 8.9 8.8* 9.0 8.9 9.1    GFR: Estimated Creatinine Clearance: 15.7 mL/min (A) (by C-G formula based on SCr of 2.36 mg/dL (H)). Liver Function Tests: No results for input(s): AST, ALT, ALKPHOS, BILITOT, PROT, ALBUMIN in the last 168 hours.  No results for input(s): LIPASE, AMYLASE in the last 168 hours. No results for input(s): AMMONIA in the last 168 hours. Coagulation Profile: No results for input(s): INR, PROTIME in the last 168 hours. Cardiac Enzymes: No results for input(s): CKTOTAL, CKMB, CKMBINDEX, TROPONINI in the last 168 hours. BNP (last 3 results) No results for input(s): PROBNP in the last 8760 hours. HbA1C: No results for input(s): HGBA1C in the last 72 hours.  CBG: Recent Labs  Lab 06/29/21 0636 06/29/21 1214 06/29/21 1722 06/29/21 2144 06/30/21 0701  GLUCAP 120* 287* 165* 143* 144*    Lipid Profile: No results for  input(s): CHOL, HDL, LDLCALC, TRIG, CHOLHDL, LDLDIRECT in the last 72 hours. Thyroid Function Tests: No results for input(s): TSH, T4TOTAL, FREET4, T3FREE, THYROIDAB in the last 72 hours. Anemia Panel: No results for input(s): VITAMINB12, FOLATE, FERRITIN, TIBC, IRON, RETICCTPCT in the last 72 hours. Sepsis Labs: Recent Labs  Lab 06/28/21 1100 06/29/21 0120 06/30/21 0434  PROCALCITON 0.24 0.28 0.39     No results found for this or any previous visit (from the past 240 hour(s)).    Radiology Studies: No results found.   LOS: 11 days   Antonieta Pert, MD [facility]  06/30/2021, 7:31 AM

## 2021-06-30 NOTE — Plan of Care (Signed)
  Problem: Education: Goal: Knowledge of General Education information will improve Description Including pain rating scale, medication(s)/side effects and non-pharmacologic comfort measures Outcome: Progressing   Problem: Health Behavior/Discharge Planning: Goal: Ability to manage health-related needs will improve Outcome: Progressing   

## 2021-06-30 NOTE — Progress Notes (Signed)
Physical Therapy Treatment Patient Details Name: Alyssa [name] MRN: [id number] DOB: [birth year] Today's Date: 06/30/2021   History of Present Illness 80 y/o female admitted 8/29  secondary to worsening shortness of breath, rash in the left flank/abdomen that progressed to the back, productive cough and weight loss. Found to have shingles, hypoxic respiratory failure and hypertensive urgency. PMH inclues PVD, COPD, HTN, and covid.    PT Comments    Patient progressing with taking some steps at bedside with RW.  She was soiled so assisted to [facility], but noted perineal area with edema and erythema and she doesn't tolerate much cleansing.  Feel she remains appropriate for SNF level rehab at d/c.  PT to continue to follow acutely.    Recommendations for follow up therapy are one component of a multi-disciplinary discharge planning process, led by the attending physician.  Recommendations may be updated based on patient status, additional functional criteria and insurance authorization.  Follow Up Recommendations  SNF     Equipment Recommendations  None recommended by PT    Recommendations for Other Services       Precautions / Restrictions Precautions Precautions: Fall Precaution Comments: airborne precautions for shingles     Mobility  Bed Mobility Overal bed mobility: Needs Assistance Bed Mobility: Supine to Sit;Sit to Supine     Supine to sit: Mod assist Sit to supine: Mod assist   General bed mobility comments: increased time, assist to move legs toward EOB and to lift trunk upright, to supine assist for legs    Transfers Overall transfer level: Needs assistance Equipment used: Rolling walker (2 wheeled) Transfers: Sit to/from [employer] Sit to Stand: Mod assist   Squat pivot transfers: Mod assist     General transfer comment: patient soiled with stool in bed so assisted to [facility] with cues for hand placement and mod A.  Patient up to RW from 3:1  with cues for hand placement and lifting help.  Ambulation/Gait Ambulation/Gait assistance: Min assist Gait Distance (Feet): 4 Feet Assistive device: Rolling walker (2 wheeled) Gait Pattern/deviations: Step-to pattern;Decreased stride length;Trunk flexed     General Gait Details: significant LLD due to severe scoliotic deformity R leg shorter so walks on toes on R, took steps to [facility] with RW   Stairs             Wheelchair Mobility    Modified Rankin (Stroke Patients Only)       Balance Overall balance assessment: Needs assistance Sitting-balance support: Feet supported;Bilateral upper extremity supported Sitting balance-Leahy Scale: Fair     Standing balance support: Bilateral upper extremity supported;During functional activity Standing balance-Leahy Scale: Poor Standing balance comment: UE support and assist for balance while PT assist with hygiene                            Cognition Arousal/Alertness: Awake/alert Behavior During Therapy: WFL for tasks assessed/performed Overall Cognitive Status: No family/caregiver present to determine baseline cognitive functioning                                 General Comments: nonverbal this session, but cooperative and follows commands well, shakes head at questions      Exercises      General Comments General comments (skin integrity, edema, etc.): SpO2 99% after activity up to [facility] on RA      Pertinent Vitals/Pain Pain Assessment: Faces  Faces Pain Scale: Hurts even more Pain Location: perineum with hygiene attempts Pain Descriptors / Indicators: Grimacing;Moaning Pain Intervention(s): Monitored during session;Repositioned;Limited activity within patient's tolerance    Home Living                      Prior Function            PT Goals (current goals can now be found in the care plan section) Progress towards PT goals: Progressing toward goals    Frequency    Min  2X/week      PT Plan Current plan remains appropriate    Co-evaluation              AM-PAC PT "6 Clicks" Mobility   Outcome Measure  Help needed turning from your back to your side while in a flat bed without using bedrails?: A Lot Help needed moving from lying on your back to sitting on the side of a flat bed without using bedrails?: A Lot Help needed moving to and from a bed to a chair (including a wheelchair)?: A Lot Help needed standing up from a chair using your arms (e.g., wheelchair or bedside chair)?: A Lot Help needed to walk in hospital room?: A Lot Help needed climbing 3-5 steps with a railing? : Total 6 Click Score: 11    End of Session   Activity Tolerance: Patient tolerated treatment well Patient left: in bed;with call bell/phone within reach;with bed alarm set Nurse Communication: Other (comment) (soiled in bed) PT Visit Diagnosis: Muscle weakness (generalized) (M62.81);Other abnormalities of gait and mobility (R26.89)     Time: 3614-4315 PT Time Calculation (min) (ACUTE ONLY): 30 min  Charges:  $Therapeutic Activity: 23-37 mins                     Magda Kiel, PT Acute Rehabilitation Services [phone] [phone] 06/30/2021    Reginia Naas 06/30/2021, 5:28 PM

## 2021-06-30 NOTE — Progress Notes (Signed)
Nutrition Follow-up  DOCUMENTATION CODES:  Underweight, Non-severe (moderate) malnutrition in context of chronic illness  INTERVENTION:  -Continue Ensure Enlive po TID, each supplement provides 350 kcal and 20 grams of protein -Continue MVI with minerals daily  Recommend transitioning pt back to regular diet given ongoing inadequate intake, fluid restriction may be continued despite diet being otherwise liberalized.   NUTRITION DIAGNOSIS:  Moderate Malnutrition related to chronic illness (dementia, COPD) as evidenced by mild fat depletion, moderate fat depletion, mild muscle depletion, moderate muscle depletion. -- ongoing  GOAL:  Patient will meet greater than or equal to 90% of their needs -- progressing  MONITOR:  PO intake, Supplement acceptance, Labs, Weight trends, Skin, I & O's  REASON FOR ASSESSMENT:  Malnutrition Screening Tool    ASSESSMENT:  Alyssa [name] is a 80 y.o. female with medical history significant of hypertension, hyperlipidemia, COPD, PVD, CKD stage stage IV, COVID-19 infection 2021, dementia presents with complaints of pain all over for the last 3 days.  Admits that a rash broke out on the left side of her abdomen and goes around to her back and states that it is very painful.  She states that she has been more short of breath and has had a productive cough with " cloudy" sputum production.  Associated symptoms include wheezing, chest pain, left hip pain, 15 pound weight loss over the last month, and generalized malaise.  Notes that she has not had an appetite.  Denies having any significant fever, nausea, or vomiting.  Patient has not tried anything to treat symptoms at home prior to coming to the hospital.  She reports quitting smoking about 1 month ago.  Records note patient has been having difficulty with shortness of breath for more than an months now  Pt admitted with herpes zoster and dyspnea secondary to lt lung collapse.   Pt with decreased appetite  since last RD assessment, meal completions charted as 0-50% (~30% average meal intake). Diet had previously been liberalized to regular to encourage PO intake but has since been transitioned back to renal per MD. Recommend transitioning pt back to regular diet given ongoing inadequate intake, fluid restriction may be continued despite diet being otherwise liberalized. Note pt continues to do well with Ensure Enlive/Plus TID per RN.   Medications: SSI, Megace, MVI with minerals, protonix, miralax, deltasone, sodium bicarbonate, lokelma Labs: BUN 46 (H), Cr 2.36 (H) CBGs: 143-165 x24 hours  UOP: 6105mL x24 hours I/O: +3.5L since admit  Diet Order:   Diet Order             Diet renal with fluid restriction Fluid restriction: 1200 mL Fluid; Room service appropriate? Yes; Fluid consistency: Thin  Diet effective now                  EDUCATION NEEDS:  Education needs have been addressed  Skin:  Skin Assessment: Skin Integrity Issues: Skin Integrity Issues:: Other (Comment) Other: rash and blisters on lower abdomen  Last BM:  9/12  Height:  Ht Readings from Last 1 Encounters:  06/16/21 5\' 3"  (1.6 m)   Weight:  Wt Readings from Last 1 Encounters:  06/29/21 49.9 kg   Ideal Body Weight:  52.3 kg  BMI:  Body mass index is 19.49 kg/m.  Estimated Nutritional Needs:  Kcal:  1600-1800 Protein:  90-105 grams Fluid:  > 1.6 L    Larkin Ina, MS, RD, LDN (she/her/hers) RD pager number and weekend/on-call pager number located in [place].

## 2021-06-30 NOTE — Progress Notes (Signed)
Palliative Medicine Inpatient Follow Up Note  Reason for Consultation: Pain control   HPI/Patient Profile: Palliative Care consult requested for symptom management (Pain) in this 80 y.o. female  with past medical history of anxiety, CKD IV, COPD, tobaccos use, hypertension, hyperlipidemia, PVD, COVID-19 (05/2020). She was admitted on 06/15/2021 from home with herpes zoster, hypertension, and generalized body aches. Chest x-ray showed left lung collapse, no evidence of obstruction.    Today's Discussion (06/30/2021):  *Please note that this is a verbal dictation therefore any spelling or grammatical errors are due to the "[place] [employer]" system interpretation.  Chart reviewed.   I met with Alyssa [name] at bedside this morning. She reports some improvements in pain after having received ropivacaine injection last night. I asked her if she would be willing to mobilize with me and she shared that she would be. She was able to sit at the edge of the bed and get to the standing position without assistance. She used the front wheel walker and was able to move forward three steps and turn around to sit down. She shares that overall she remains to have pain but does note some improvement. I provided education on herpes zoster pain and how troubling it can be, we also reviewed that unfortunately, it can be long lasting. I encouraged Alyssa [name] to continue to mobilize as much as able.  We reviewed the plan for transition to a skilled nursing facility for strength optimization once Alyssa [name] is medically optimized for this which she is in favor of.   Questions and concerns addressed. Palliative support was provided  Objective Assessment: Vital Signs Vitals:   06/30/21 0535 06/30/21 0745  BP: (!) 146/95   Pulse: 65   Resp: 18   Temp: 97.7 F (36.5 C)   SpO2: 100% 93%    Intake/Output Summary (Last 24 hours) at 06/30/2021 8119 Last data filed at 06/30/2021 0553 Gross per 24 hour  Intake 1017 ml   Output 650 ml  Net 367 ml    Last Weight  Most recent update: 06/30/2021  3:30 AM    Weight  49.9 kg (110 lb 0.2 oz)            Physical Exam General: NAD, elderly [name]  Cardiovascular: regular rate and rhythm Pulmonary: diminished bilaterally  Abdomen: soft, nontender, + bowel sounds Extremities: no edema, no joint deformities Skin: left-sided zosters, painful to touch, alopecia  Neurological: AAOx3, mood appropriate   SUMMARY OF RECOMMENDATIONS   DNR/DNI  Continue with current plan of care  Discussed current pain regimen and options, modification as below Modified current pain regimen Lyrica $RemoveBefore'75mg'swFdCKGRhZKdQ$  PO Qday Cymbalta $RemoveBefor'40mg'NIxxfzJbinMV$  PO Qday   Amitriptyline (TCA) has been considered but the SE profile remains high for our geriatric population Tylenol 1g PO TID Capsicum cream  TID Start Lidoderm patches Oxycodone $RemoveBeforeDEI'10mg'xMWJpLyndCNqhrQg$  PO TID & Oxycodone 5/$RemoveBefor'10mg'KJqdXsFYGeMN$  PO Q4H PRN moderate/severe pain depending on tolerance could remove TID IR and add a ER version Appreciate anesthesiology consultation and recommendations --> S/P T11 injection of ropivacaine   PMT will continue to support and follow until discharge  Time Spent: 55 Greater than 50% of the time was spent in counseling and coordination of care ______________________________________________________________________________________ [place] Team Team Cell Phone: [phone] Please utilize secure chat with additional questions, if there is no response within 30 minutes please call the above phone number  Palliative Medicine Team providers are available by phone from 7am to 7pm daily and can be reached through the  team cell phone.  Should this patient require assistance outside of these hours, please call the patient's attending physician.

## 2021-06-30 NOTE — Progress Notes (Signed)
Pharmacy Antibiotic Note  Alyssa [name] is a 80 y.o. female admitted on 06/15/2021 with pneumonia. Pharmacy has been consulted to change Unasyn to Zosyn x5 days.  Plan: Stop Unasyn Zosyn 2.25g IV q8h x5 days Follow Cr   Height: 5\' 3"  (160 cm) Weight: 49.9 kg (110 lb 0.2 oz) IBW/kg (Calculated) : 52.4  Temp (24hrs), Avg:97.9 F (36.6 C), Min:97.6 F (36.4 C), Max:98.4 F (36.9 C)  Recent Labs  Lab 06/26/21 0630 06/27/21 0328 06/28/21 0802 06/29/21 0120 06/30/21 0434  WBC 12.8* 12.4* 19.8* 22.6* 26.4*  CREATININE 2.73* 2.57* 2.43* 2.42* 2.36*     Estimated Creatinine Clearance: 15.7 mL/min (A) (by C-G formula based on SCr of 2.36 mg/dL (H)).    No Known Allergies  Arrie Senate, PharmD, BCPS, [facility] Clinical Pharmacist [phone] Please check AMION for all [place] numbers 06/30/2021

## 2021-06-30 NOTE — Progress Notes (Signed)
Occupational Therapy Treatment Patient Details Name: Alyssa [name] MRN: [id number] DOB: Sep 20, 1941 Today's Date: 06/30/2021   History of present illness 80 y/o female admitted 8/29  secondary to worsening shortness of breath, rash in the left flank/abdomen that progressed to the back, productive cough and weight loss. Found to have shingles, hypoxic respiratory failure and hypertensive urgency. PMH inclues PVD, COPD, HTN, and covid.   OT comments  Patient up in recliner and nursing stated she had been up for a few hours.  Patient asked to return to bed when therapist arrived.  Patient required min assist to stand from recliner and to transfer to eob.  Patient had soiled self in recliner and stood to perform cleaning with max assist and required 2 stands to complete. Patient was mod assist to get to supine.  Patient was limited by pain.  Acute OT to continue to follow.    Recommendations for follow up therapy are one component of a multi-disciplinary discharge planning process, led by the attending physician.  Recommendations may be updated based on patient status, additional functional criteria and insurance authorization.    Follow Up Recommendations  SNF    Equipment Recommendations  3 in 1 bedside commode;Wheelchair (measurements OT);Wheelchair cushion (measurements OT)    Recommendations for Other Services      Precautions / Restrictions Precautions Precautions: Fall Precaution Comments: airborn precautions for shingles       Mobility Bed Mobility Overal bed mobility: Needs Assistance Bed Mobility: Sit to Supine       Sit to supine: Mod assist   General bed mobility comments: required verbal cues and limited due to pain    Transfers Overall transfer level: Needs assistance Equipment used: Rolling walker (2 wheeled) Transfers: Sit to/from Stand Sit to Stand: Min assist         General transfer comment: used rw to transfer from recliner to eob    Balance  Overall balance assessment: Needs assistance Sitting-balance support: Feet supported;Bilateral upper extremity supported Sitting balance-Leahy Scale: Fair Sitting balance - Comments: Reliant on UE support to maintain sitting balance Postural control: Posterior lean Standing balance support: Bilateral upper extremity supported;During functional activity Standing balance-Leahy Scale: Poor Standing balance comment: patient required vcs for standing and assistace with balance                           ADL either performed or assessed with clinical judgement   ADL Overall ADL's : Needs assistance/impaired             Lower Body Bathing: Maximal assistance;Sitting/lateral leans Lower Body Bathing Details (indicate cue type and reason): Patient was soiled and stood from eob for cleaning                       General ADL Comments: LB bathing performed to clean from soiling self     Vision       Perception     Praxis      Cognition Arousal/Alertness: Awake/alert Behavior During Therapy: WFL for tasks assessed/performed Overall Cognitive Status: No family/caregiver present to determine baseline cognitive functioning                                 General Comments: Patient sitting up in recliner, slow to respond to questions        Exercises     Shoulder Instructions  General Comments      Pertinent Vitals/ Pain       Pain Assessment: 0-10 Pain Score: 9  Faces Pain Scale: Hurts even more Pain Location: back, buttocks Pain Descriptors / Indicators: Grimacing;Moaning Pain Intervention(s): Patient requesting pain meds-RN notified  Home Living                                          Prior Functioning/Environment              Frequency  Min 2X/week        Progress Toward Goals  OT Goals(current goals can now be found in the care plan section)  Progress towards OT goals: Progressing toward  goals  Acute Rehab OT Goals Patient Stated Goal: feel better OT Goal Formulation: With patient Time For Goal Achievement: 07/04/21 Potential to Achieve Goals: Fair ADL Goals Pt Will Perform Grooming: with set-up;sitting;standing Pt Will Perform Lower Body Dressing: with set-up;sit to/from stand Pt Will Transfer to Toilet: with modified independence;ambulating;regular height toilet Pt/caregiver will Perform Home Exercise Program: Increased strength;Both right and left upper extremity;With theraband;With Supervision;With written HEP provided  Plan Discharge plan remains appropriate    Co-evaluation                 AM-PAC OT "6 Clicks" Daily Activity     Outcome Measure   Help from another person eating meals?: None Help from another person taking care of personal grooming?: A Little Help from another person toileting, which includes using toliet, bedpan, or urinal?: A Lot Help from another person bathing (including washing, rinsing, drying)?: A Lot Help from another person to put on and taking off regular upper body clothing?: A Little Help from another person to put on and taking off regular lower body clothing?: A Lot 6 Click Score: 16    End of Session Equipment Utilized During Treatment: Rolling walker;Oxygen  OT Visit Diagnosis: Unsteadiness on feet (R26.81);Muscle weakness (generalized) (M62.81);Pain Pain - part of body:  (back and buttocks)   Activity Tolerance Patient limited by pain   Patient Left in bed;with call bell/phone within reach;with bed alarm set   Nurse Communication Patient requests pain meds        Time: 7741-2878 OT Time Calculation (min): 35 min  Charges: OT General Charges $OT Visit: 1 Visit OT Treatments $Self Care/Home Management : 23-37 mins  Lodema Hong, OTA   Maiko Salais Alexis Goodell 06/30/2021, 10:47 AM

## 2021-06-30 NOTE — TOC Progression Note (Signed)
Transition of Care [facility]) - Progression Note    Patient Details  Name: PEIGHTON MEHRA MRN: [id number] Date of Birth: 26-Feb-1941  [facility] CM/SW Contact  [name], LCSW Phone Number: 06/30/2021, 1:21 PM  Clinical Narrative: Talked with Juliann Pulse, admissions liaison with [facility] regarding insurance authorization and it remains pending. She will contact CSW once auth received. Patient contacted by phone and update provided. CSW will continue to follow and facilitate discharge to [facility] once insurance auth received.       Expected Discharge Plan: [place] Barriers to Discharge: Continued Medical Work up  Expected Discharge Plan and Services Expected Discharge Plan: [place] In-house Referral: Clinical Social Work     Living arrangements for the past 2 months: Single Family Home                                       Social Determinants of Health (SDOH) Interventions  No SDOH interventions requested or needed at this time.   Readmission Risk Interventions Readmission Risk Prevention Plan 07/10/2020 12/26/2018  Transportation Screening Complete Complete  Medication Review Press photographer) Complete Complete  PCP or Specialist appointment within 3-5 days of discharge - Not Complete  PCP/Specialist Appt Not Complete comments - pt for snf. Snf MD to see as needed.  [place] or Home Care Consult Complete Not Complete  HRI or Home Care Consult Pt Refusal Comments - NA  SW Recovery Care/Counseling Consult Complete Not Complete  SW Consult Not Complete Comments - NA  Palliative Care Screening Not Applicable Complete  Skilled Nursing Facility Not Applicable Complete  Some recent data might be hidden

## 2021-07-01 DIAGNOSIS — R0602 Shortness of breath: Secondary | ICD-10-CM | POA: Diagnosis not present

## 2021-07-01 DIAGNOSIS — N184 Chronic kidney disease, stage 4 (severe): Secondary | ICD-10-CM | POA: Diagnosis not present

## 2021-07-01 DIAGNOSIS — M792 Neuralgia and neuritis, unspecified: Secondary | ICD-10-CM | POA: Diagnosis not present

## 2021-07-01 DIAGNOSIS — B029 Zoster without complications: Secondary | ICD-10-CM | POA: Diagnosis not present

## 2021-07-01 LAB — CBC
HCT: 22.5 % — ABNORMAL LOW (ref 36.0–46.0)
Hemoglobin: 7.5 g/dL — ABNORMAL LOW (ref 12.0–15.0)
MCH: 28.3 pg (ref 26.0–34.0)
MCHC: 33.3 g/dL (ref 30.0–36.0)
MCV: 84.9 fL (ref 80.0–100.0)
Platelets: 458 10*3/uL — ABNORMAL HIGH (ref 150–400)
RBC: 2.65 MIL/uL — ABNORMAL LOW (ref 3.87–5.11)
RDW: 18.6 % — ABNORMAL HIGH (ref 11.5–15.5)
WBC: 21.1 10*3/uL — ABNORMAL HIGH (ref 4.0–10.5)
nRBC: 0 % (ref 0.0–0.2)

## 2021-07-01 LAB — BASIC METABOLIC PANEL
Anion gap: 15 (ref 5–15)
BUN: 45 mg/dL — ABNORMAL HIGH (ref 8–23)
CO2: 20 mmol/L — ABNORMAL LOW (ref 22–32)
Calcium: 9 mg/dL (ref 8.9–10.3)
Chloride: 105 mmol/L (ref 98–111)
Creatinine, Ser: 2.32 mg/dL — ABNORMAL HIGH (ref 0.44–1.00)
GFR, Estimated: 21 mL/min — ABNORMAL LOW (ref >60)
Glucose, Bld: 84 mg/dL (ref 70–99)
Potassium: 5.1 mmol/L (ref 3.5–5.1)
Sodium: 140 mmol/L (ref 135–145)

## 2021-07-01 LAB — GLUCOSE, CAPILLARY
Glucose-Capillary: 111 mg/dL — ABNORMAL HIGH (ref 70–99)
Glucose-Capillary: 180 mg/dL — ABNORMAL HIGH (ref 70–99)
Glucose-Capillary: 122 mg/dL — ABNORMAL HIGH (ref 70–99)
Glucose-Capillary: 123 mg/dL — ABNORMAL HIGH (ref 70–99)

## 2021-07-01 NOTE — Progress Notes (Signed)
PROGRESS NOTE    Alyssa [name]  BLT:[id number] DOB: 1941-02-06 DOA: 06/15/2021 PCP: [name], DO   Chief Complaint  Patient presents with   Generalized Body Aches   Brief Narrative: 80 year old female with hypertension, HLD, COPD, PVD, CKD stage IV, COVID-19 infection in 2021, dementia presents with pain all over x3 days along with a rash.  She was seen in the ED creatinine 3.1, troponin 31 chest x-ray left lower lobe collapse COVID-19 negative CT of the chest abdomen pelvis showed left lower lobe collapse without visible mass or adenopathy and femoral bypass graft with increased fluid collection and bypass anastomosis measuring 4.8 X4.1 previously 2.6 X2.6 Patient was given valacyclovir IV fluids and admitted for further management for dyspnea due to left lung collapse, hypertension urgency, weight loss, borderline elevated troponin, nongap metabolic acidosis, anemia. Patient was managed with Valtrex, pain control supplemental oxygen. Seen by PT OT and skilled nursing facility recommended. Pain medication adjusted for pain control ,IR consulted but unable to do nerve block. 9/12-anesthesia was able to inject ropivacaine at T11 level-with some improvement in her pain 9/13-antibiotic were changed to Zosyn   Subjective: Seen and examined this morning Patient reported she worked with physical therapy in the room yesterday Afebrile overnight.   Renal function stable WBC downtrending  She reports she feels some relief from pain after nerve block but still has pain.  Assessment & Plan:  Acute hypoxic respiratory failure Dyspnea Left lower lobe collapse 2/2 splinting due chest wall pain from zooster Chronic COPD Dyspnea, respiratory failure secondary to atelectasis from splinting due to pain from the left chest wall from herpes in the setting of chronic COPD.Continue to optimize pain control, seen by pulmonary.  Continue supplemental oxygen, bronchodilators PT OT. PCCM planning to  repeat CT chest and outpatient follow-up in a month-if still not reexpanded then may need inspection bronchoscopy.At risk of developing pneumonia.  Acute Shingles/herpes zoster viral involving up to T11 dermatome involving the left flank and left abdomen: Rash/crusting but still with ongoing pain issues despite on Lyrica Cymbalta scheduled Tylenol, oxycodone scheduled/ prn Percocet.  Apprecaite anesthesia inputs-  s/p  nerve block w/ rupivacaine injection at T11 on 9/12 with some improvement.S/p valtrex. Also on prednisone  taper.  Leukocytosis:WBC count uptrending further , procalcitonin 0.3, fortunately remains afebrile, suspect in the setting of her atelectasis, at risk of pneumonia.  Unsyn changed to zosyn 9/13 and wbc downtrending now. Hold off on bloed cx. Recent Labs  Lab 06/27/21 0328 06/28/21 0802 06/28/21 1100 06/29/21 0120 06/30/21 0434 07/01/21 0422  WBC 12.4* 19.8*  --  22.6* 26.4* 21.1*  PROCALCITON  --   --  0.24 0.28 0.39  --     Hypertension urgency: BP fairly controlled on amlodipine, Coreg, clonidine.  prn oral hydralazine.  CKD stage IV/metabolic  acidosis-previous creatinine around 2.6-2.9. Creatinine has stabilized  to baseline. On gentle IV fluid hydration -we will discontinue, continue oral bicarb. Recent Labs  Lab 06/27/21 0328 06/28/21 0802 06/29/21 0120 06/30/21 0434 07/01/21 0422  BUN 55* 54* 52* 46* 45*  CREATININE 2.57* 2.43* 2.42* 2.36* 2.32*   Hyperkalemia on Lokelma scheduled,  potassium remains on higher side monitor K level  Recent Labs  Lab 06/27/21 0328 06/28/21 0802 06/29/21 0120 06/30/21 0434 07/01/21 0422  K 5.0 5.2* 5.1 5.1 5.1    PVD- Fem fem bypass  in 2011/CT on admission showing fluid collection from pseudoaneurysm, seen by Dr. 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 she is on Plavix aspirin and statin  appreciate input from vascular.  History of tobacco abuse-has quit few months ago Moderate protein calorie malnutrition: Augment diet as tolerated Dementia: Alert awake appears to be close to baseline.  Continue delirium precaution, fall precaution and supportive care.    Deconditioning: Cont PT OT and will need SNF  HLD Cont statin Anemia of chronic kidney disease stable  in 7- mid 8s g , cont to monitor Recent Labs  Lab 06/27/21 0328 06/28/21 0802 06/29/21 0120 06/30/21 0434 07/01/21 0422  HGB 7.7* 8.1* 7.6* 8.0* 7.5*  HCT 22.9* 23.8* 22.5* 24.2* 22.5*  Depression with anxiety-mood stable  Constipation: Dulcolax suppository and daily MiraLAX will be ordered as patient is getting pain meds.  Goals of care: DNR, seen by palliative care.  Prognosis does not appear bright in this patient with multiple comorbidities.    Diet Order             Diet renal with fluid restriction Fluid restriction: 1200 mL Fluid; Room service appropriate? Yes; Fluid consistency: Thin  Diet effective now                   Nutrition Problem: Moderate Malnutrition Etiology: chronic illness (dementia, COPD) Signs/Symptoms: mild fat depletion, moderate fat depletion, mild muscle depletion, moderate muscle depletion Interventions: Ensure Enlive (each supplement provides 350kcal and 20 grams of protein), MVI, Liberalize Diet Patient's Body mass index is 19.49 kg/m.  DVT prophylaxis: heparin injection 5,000 Units Start: 06/16/21 0930 Code Status:   Code Status: DNR  Family Communication: plan of care discussed with patient, I had updated patient's son .  Status is: Inpatient Remains inpatient appropriate because:Inpatient level of care appropriate due to severity of illness  Dispo:The patient is from: Home            Anticipated d/c is to: SNF  once available            Patient currently is not medically stable            Difficult to place patient No  Unresulted Labs (From admission, onward)     None      Medications reviewed:  Scheduled Meds:  oxyCODONE  10 mg Oral Q8H   And   acetaminophen  1,000 mg Oral Q8H   amLODipine  10 mg Oral QHS   arformoterol  15 mcg Nebulization BID   And   budesonide (PULMICORT) nebulizer solution  0.25 mg Nebulization BID   And   revefenacin  175 mcg Nebulization Daily   aspirin EC  81 mg Oral Daily   capsaicin   Topical TID   carvedilol  12.5 mg Oral BID   cloNIDine  0.2 mg Oral QHS   clopidogrel  75 mg Oral Daily   donepezil  5 mg Oral Q2200   DULoxetine  40 mg Oral Daily   feeding supplement  237 mL Oral TID BM   heparin  5,000 Units Subcutaneous Q8H   insulin aspart  0-5 Units Subcutaneous QHS   insulin aspart  0-6 Units Subcutaneous TID WC   lidocaine  2 patch Transdermal Q24H   megestrol  40 mg Oral BID   multivitamin with minerals  1 tablet Oral Daily   pantoprazole  40 mg Oral Daily   polyethylene glycol  17 g Oral Daily   predniSONE  10 mg Oral Q breakfast   pregabalin  75 mg Oral  Daily   rosuvastatin  10 mg Oral Daily   sodium bicarbonate  650 mg Oral BID   sodium chloride flush  3 mL Intravenous Q12H   sodium zirconium cyclosilicate  10 g Oral Daily   Continuous Infusions:  lactated ringers 30 mL/hr at 07/01/21 0523   piperacillin-tazobactam (ZOSYN)  IV 2.25 g (07/01/21 0521)   Consultants:see note  Procedures:see note Antimicrobials: Anti-infectives (From admission, onward)    Start     Dose/Rate Route Frequency Ordered Stop   06/30/21 1615  piperacillin-tazobactam (ZOSYN) IVPB 2.25 g        2.25 g 100 mL/hr over 30 Minutes Intravenous Every 8 hours 06/30/21 1518 07/05/21 0559   06/28/21 1230  ampicillin-sulbactam (UNASYN) 1.5 g in sodium chloride 0.9 % 100 mL IVPB  Status:  Discontinued        1.5 g 200 mL/hr over 30 Minutes Intravenous Every 12 hours 06/28/21 1127 06/30/21 1518   06/17/21 0800  valACYclovir (VALTREX) tablet 1,000 mg        1,000 mg Oral Daily 06/16/21 0839 06/22/21 0900   06/16/21 0515   valACYclovir (VALTREX) tablet 1,000 mg        1,000 mg Oral STAT 06/16/21 0502 06/16/21 0543      Culture/Microbiology    Component Value Date/Time   SDES URINE, RANDOM 09/29/2020 2353   SPECREQUEST  09/29/2020 2353    NONE Performed at [facility], [place] [address] [place], [place] [address]    CULT 20,000 COLONIES/mL ENTEROBACTER AEROGENES (A) 09/29/2020 2353   REPTSTATUS 10/01/2020 FINAL 09/29/2020 2353    Other culture-see note  Objective: Vitals: Today's Vitals   07/01/21 0517 07/01/21 0752 07/01/21 1038 07/01/21 1043  BP: (!) 150/85  139/80   Pulse: 82 78 72   Resp: 18 18 18    Temp: 98.4 F (36.9 C)  98.4 F (36.9 C)   TempSrc: Oral  Oral   SpO2: 98% 94%    Weight:      Height:      PainSc: 6    9     Intake/Output Summary (Last 24 hours) at 07/01/2021 1141 Last data filed at 07/01/2021 1052 Gross per 24 hour  Intake 1486 ml  Output 200 ml  Net 1286 ml   Filed Weights   06/25/21 2139 06/29/21 1017 06/29/21 2141  Weight: 49.9 kg 49.9 kg 49.9 kg   Weight change:   Intake/Output from previous day: 09/13 0701 - 09/14 0700 In: 1300 [P.O.:840; I.V.:360; IV Piggyback:100] Out: 100 [Urine:100] Intake/Output this shift: Total I/O In: 386 [P.O.:240; I.V.:146] Out: 100 [Urine:100] Filed Weights   06/25/21 2139 06/29/21 1017 06/29/21 2141  Weight: 49.9 kg 49.9 kg 49.9 kg   Examination: General exam:AAO,pleasant,weak appearing. HEENT:Oral mucosa moist, Ear/Nose WNL grossly, dentition normal. Respiratory system: bilaterally air entry + diminished at left base, no use of accessory muscle Cardiovascular system: S1 & S2 +, No JVD,. Gastrointestinal system: Abdomen soft,NT,ND, BS+ Nervous System:Alert, awake, moving extremities and grossly nonfocal Extremities: No edema, distal peripheral pulses palpable.  Skin: No rashes,no icterus.  Shingle rash present but crusting on left flank and left anterior abdomen. MSK: Normal muscle bulk,tone, power.   Data  Reviewed: I have personally reviewed following labs and imaging studies CBC: Recent Labs  Lab 06/27/21 0328 06/28/21 0802 06/29/21 0120 06/30/21 0434 07/01/21 0422  WBC 12.4* 19.8* 22.6* 26.4* 21.1*  HGB 7.7* 8.1* 7.6* 8.0* 7.5*  HCT 22.9* 23.8* 22.5* 24.2* 22.5*  MCV 84.2 84.7 85.9 85.2 84.9  PLT 445* 463* 421* 436* 354*   Basic Metabolic Panel: Recent Labs  Lab 06/27/21 0328 06/28/21 0802 06/29/21 0120 06/30/21 0434 07/01/21 0422  NA 139 139 142 142 140  K 5.0 5.2* 5.1 5.1 5.1  CL 110 111 111 113* 105  CO2 21* 21* 19* 21* 20*  GLUCOSE 148* 120* 214* 124* 84  BUN 55* 54* 52* 46* 45*  CREATININE 2.57* 2.43* 2.42* 2.36* 2.32*  CALCIUM 8.8* 9.0 8.9 9.1 9.0   GFR: Estimated Creatinine Clearance: 16 mL/min (A) (by C-G formula based on SCr of 2.32 mg/dL (H)). Liver Function Tests: No results for input(s): AST, ALT, ALKPHOS, BILITOT, PROT, ALBUMIN in the last 168 hours.  No results for input(s): LIPASE, AMYLASE in the last 168 hours. No results for input(s): AMMONIA in the last 168 hours. Coagulation Profile: No results for input(s): INR, PROTIME in the last 168 hours. Cardiac Enzymes: No results for input(s): CKTOTAL, CKMB, CKMBINDEX, TROPONINI in the last 168 hours. BNP (last 3 results) No results for input(s): PROBNP in the last 8760 hours. HbA1C: No results for input(s): HGBA1C in the last 72 hours.  CBG: Recent Labs  Lab 06/30/21 0701 06/30/21 1118 06/30/21 1717 06/30/21 2032 07/01/21 0646  GLUCAP 144* 154* 141* 227* 111*   Lipid Profile: No results for input(s): CHOL, HDL, LDLCALC, TRIG, CHOLHDL, LDLDIRECT in the last 72 hours. Thyroid Function Tests: No results for input(s): TSH, T4TOTAL, FREET4, T3FREE, THYROIDAB in the last 72 hours. Anemia Panel: No results for input(s): VITAMINB12, FOLATE, FERRITIN, TIBC, IRON, RETICCTPCT in the last 72 hours. Sepsis Labs: Recent Labs  Lab 06/28/21 1100 06/29/21 0120 06/30/21 0434  PROCALCITON 0.24 0.28 0.39     No results found for this or any previous visit (from the past 240 hour(s)).    Radiology Studies: No results found.   LOS: 12 days   Antonieta Pert, MD [facility]  07/01/2021, 11:41 AM

## 2021-07-01 NOTE — Progress Notes (Signed)
    Palliative Medicine Inpatient Follow Up Note    Today's Discussion (07/01/2021):  Chart reviewed. Updates from RN. Patient examined at the bedside. No acute distress. Complains of some pain, however feels current regimen is effective. She has recently received PRN medication. She is ambulatory around the room. Appetite continues to improve. She is asking when she will discharge to rehab.   We discussed current pain regimen with plans for no changes at this time. Will continue to closely monitor regimen. Patient verbalized her agreement with said plans.   Discussed the importance of continued conversation with family and their  medical providers regarding overall plan of care and treatment options, ensuring decisions are within the context of the patients values and GOCs.   Questions and concerns addressed   Objective Assessment: Vital Signs Vitals:   07/01/21 0752 07/01/21 1038  BP:  139/80  Pulse: 78 72  Resp: 18 18  Temp:  98.4 F (36.9 C)  SpO2: 94%     Intake/Output Summary (Last 24 hours) at 07/01/2021 1524 Last data filed at 07/01/2021 1503 Gross per 24 hour  Intake 1506 ml  Output 200 ml  Net 1306 ml   Last Weight  Most recent update: 06/30/2021  3:30 AM    Weight  49.9 kg (110 lb 0.2 oz)            Gen:  NAD HEENT: moist mucous membranes CV: RRR PULM: clear bilaterally  ABD: soft/nontender/nondistended/normal bowel sounds EXT: No edema Neuro: Alert and oriented x3, mood appropriate, able to move around and sit on the side of the bed comfortably.   SUMMARY OF RECOMMENDATIONS   DNR/DNI  Continue with current plan of care  Discussed current pain regimen and options, no changes today as patient feels pain is controlled. Will continue to closely monitor.  Modified current pain regimen (9/13) Lyrica 75mg  PO Qday Cymbalta 40mg  PO Qday   Amitriptyline (TCA) has been considered but the SE profile remains high for our geriatric population Tylenol 1g PO  TID Capsicum cream  TID Start Lidoderm patches Oxycodone 10mg  PO TID & Oxycodone 5/10mg  PO Q4H PRN moderate/severe pain depending on tolerance could remove TID IR and add a ER version Appreciate anesthesiology consultation and recommendations --> S/P T11 injection of ropivacaine   PMT will continue to support and follow until discharge   Time Total: 25 min.   Visit consisted of counseling and education dealing with the complex and emotionally intense issues of symptom management and palliative care in the setting of serious and potentially life-threatening illness.Greater than 50%  of this time was spent counseling and coordinating care related to the above assessment and plan.  [name], AGPCNP-BC  Palliative Medicine Team [phone]  Please utilize secure chat with additional questions, if there is no response within 30 minutes please call the above phone number.   Palliative Medicine Team providers are available by phone from 7am to 7pm daily and can be reached through the team cell phone.  Should this patient require assistance outside of these hours, please call the patient's attending physician.

## 2021-07-02 ENCOUNTER — Inpatient Hospital Stay (HOSPITAL_COMMUNITY): Payer: Medicare HMO

## 2021-07-02 DIAGNOSIS — R0602 Shortness of breath: Secondary | ICD-10-CM | POA: Diagnosis not present

## 2021-07-02 LAB — URINALYSIS, ROUTINE W REFLEX MICROSCOPIC
Bilirubin Urine: NEGATIVE
Glucose, UA: NEGATIVE mg/dL
Ketones, ur: NEGATIVE mg/dL
Nitrite: NEGATIVE
Protein, ur: 100 mg/dL — AB
RBC / HPF: >50 RBC/hpf — ABNORMAL HIGH (ref 0–5)
Specific Gravity, Urine: 1.017 (ref 1.005–1.030)
WBC, UA: >50 WBC/hpf — ABNORMAL HIGH (ref 0–5)
pH: 8 (ref 5.0–8.0)

## 2021-07-02 LAB — BASIC METABOLIC PANEL
Anion gap: 11 (ref 5–15)
BUN: 42 mg/dL — ABNORMAL HIGH (ref 8–23)
CO2: 20 mmol/L — ABNORMAL LOW (ref 22–32)
Calcium: 8.7 mg/dL — ABNORMAL LOW (ref 8.9–10.3)
Chloride: 109 mmol/L (ref 98–111)
Creatinine, Ser: 2.63 mg/dL — ABNORMAL HIGH (ref 0.44–1.00)
GFR, Estimated: 18 mL/min — ABNORMAL LOW (ref >60)
Glucose, Bld: 99 mg/dL (ref 70–99)
Potassium: 5.2 mmol/L — ABNORMAL HIGH (ref 3.5–5.1)
Sodium: 140 mmol/L (ref 135–145)

## 2021-07-02 LAB — CBC
HCT: 20.9 % — ABNORMAL LOW (ref 36.0–46.0)
Hemoglobin: 7 g/dL — ABNORMAL LOW (ref 12.0–15.0)
MCH: 28.3 pg (ref 26.0–34.0)
MCHC: 33.5 g/dL (ref 30.0–36.0)
MCV: 84.6 fL (ref 80.0–100.0)
Platelets: 487 10*3/uL — ABNORMAL HIGH (ref 150–400)
RBC: 2.47 MIL/uL — ABNORMAL LOW (ref 3.87–5.11)
RDW: 18.2 % — ABNORMAL HIGH (ref 11.5–15.5)
WBC: 15.9 10*3/uL — ABNORMAL HIGH (ref 4.0–10.5)
nRBC: 0.1 % (ref 0.0–0.2)

## 2021-07-02 LAB — GLUCOSE, CAPILLARY
Glucose-Capillary: 117 mg/dL — ABNORMAL HIGH (ref 70–99)
Glucose-Capillary: 124 mg/dL — ABNORMAL HIGH (ref 70–99)
Glucose-Capillary: 158 mg/dL — ABNORMAL HIGH (ref 70–99)
Glucose-Capillary: 101 mg/dL — ABNORMAL HIGH (ref 70–99)

## 2021-07-02 MED ORDER — WHITE PETROLATUM EX OINT
TOPICAL_OINTMENT | CUTANEOUS | Status: AC
  Filled 2021-07-02: qty 28.35

## 2021-07-02 MED ORDER — SODIUM CHLORIDE 0.9 % IV SOLN: INTRAVENOUS | Status: DC

## 2021-07-02 NOTE — Progress Notes (Signed)
PT Cancellation Note  Patient Details Name: Alyssa [name] MRN: [id number] DOB: Dec 12, 1940   Cancelled Treatment:    Reason Eval/Treat Not Completed: Fatigue/lethargy limiting ability to participate; attempted earlier today and pt reports already up with OT and fatigued.  Will attempt again another day.   Reginia Naas 07/02/2021, 4:36 PM Magda Kiel, PT Acute Rehabilitation Services Pager:[phone] Office:[phone] 07/02/2021

## 2021-07-02 NOTE — Progress Notes (Signed)
PROGRESS NOTE    Alyssa [name]  WVP:[id number] DOB: [birth year] DOA: 06/15/2021 PCP: [name], DO   Chief Complaint  Patient presents with   Generalized Body Aches   Brief Narrative: 80 year old female with hypertension, HLD, COPD, PVD, CKD stage IV, COVID-19 infection in 2021, dementia presents with pain all over x3 days along with a rash.  She was seen in the ED creatinine 3.1, troponin 31 chest x-ray left lower lobe collapse COVID-19 negative CT of the chest abdomen pelvis showed left lower lobe collapse without visible mass or adenopathy and femoral bypass graft with increased fluid collection and bypass anastomosis measuring 4.8 X4.1 previously 2.6 X2.6 Patient was given valacyclovir IV fluids and admitted for further management for dyspnea due to left lung collapse, hypertension urgency, weight loss, borderline elevated troponin, nongap metabolic acidosis, anemia. Patient was managed with Valtrex, pain control supplemental oxygen. Seen by PT OT and skilled nursing facility recommended. Pain medication adjusted for pain control ,IR consulted but unable to do nerve block. 9/12-anesthesia was able to inject ropivacaine at T11 level-with some improvement in her pain 9/13-antibiotic were changed to Zosyn -WBC count downtrending. Mild confusion-??  History of dementia may have MCI per son was not diagnosed dementia previously.  Suspecting delirium, CT head 9/14 no acute finding  Subjective: Seen and examined this morning. Alert awake mildly confused Poor historian. Potassium slightly leukocytosis downtrending.  Remains afebrile on 2 L nasal cannula.  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  Continue supplemental oxygen, bronchodilators PT OT. PCCM planning to repeat CT chest and outpatient follow-up in a month-if still not reexpanded then may need inspection bronchoscopy.At risk of developing pneumonia.  Acute Shingles/herpes zoster viral involving up to T11 dermatome involving the left flank and left abdomen: Completed Valtrex, rash crusting healing off airborne isolation.  Currently managing pain with multiple regimen including Tylenol opiates, Lyrica Cymbalta CKD/lidocaine s/p rupivacaine injection at T11 on 9/12-following which pain is better controlled.  On prednisone will taper it off  Mild confusion-??  History of dementia may have MCI per son was not diagnosed dementia previously.  Suspecting delirium, CT head 9/14 no acute finding.  UA grossly abnormal but already covered with antibiotics.  Leukocytosis:WBC count uptrending further , procalcitonin 0.3, fortunately remains afebrile, suspect in the setting of her atelectasis, at risk of pneumonia.  Unsyn changed to zosyn 9/13 and wbc downtrending since.  Monitor CBC. Recent Labs  Lab 06/28/21 0802 06/28/21 1100 06/29/21 0120 06/30/21 0434 07/01/21 0422 07/02/21 0326  WBC 19.8*  --  22.6* 26.4* 21.1* 15.9*  PROCALCITON  --  0.24 0.28 0.39  --   --      Hypertension urgency: BP fairly controlled on amlodipine, Coreg, clonidine.  prn oral hydralazine.  CKD stage IV/metabolic  acidosis-previous creatinine around 2.6-2.9. Creatinine slightly uptrending starting IV fluids, continue oral bicarb. Recent Labs  Lab 06/28/21 0802 06/29/21 0120 06/30/21 0434 07/01/21 0422 07/02/21 0326  BUN 54* 52* 46* 45* 42*  CREATININE 2.43* 2.42* 2.36* 2.32* 2.63*    Hyperkalemia on Lokelma scheduled, DC Ringer's lactate and keep on normal saline.  Ensure she is on renal diet with low potassium. Recent Labs  Lab 06/28/21 0802 06/29/21 0120 06/30/21 0434 07/01/21 0422  07/02/21 0326  K 5.2* 5.1 5.1 5.1 5.2*      PVD- Fem fem bypass  in 2011/CT on admission showing fluid collection from pseudoaneurysm, seen by Dr. 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 she is on Plavix aspirin and statin appreciate input from vascular.  History of tobacco abuse-has quit few months ago Moderate protein calorie malnutrition: Augment diet as tolerated Dementia ??:  supportive care.    Deconditioning: Cont PT OT and will need SNF  HLD Cont statin Anemia of chronic kidney disease stable  in 7- mid 8s g.  Noted some drop this morning may need transfusion recheck  in am-May need transfusion Recent Labs  Lab 06/28/21 0802 06/29/21 0120 06/30/21 0434 07/01/21 0422 07/02/21 0326  HGB 8.1* 7.6* 8.0* 7.5* 7.0*  HCT 23.8* 22.5* 24.2* 22.5* 20.9*   Depression with anxiety-mood stable  Constipation: Dulcolax suppository and daily MiraLAX will be ordered as patient is getting pain meds.  Goals of care: DNR, seen by palliative care.  Prognosis does not appear bright in this patient with multiple comorbidities.    Diet Order             Diet renal with fluid restriction Fluid restriction: 1200 mL Fluid; Room service appropriate? Yes; Fluid consistency: Thin  Diet effective now                   Nutrition Problem: Moderate Malnutrition Etiology: chronic illness (dementia, COPD) Signs/Symptoms: mild fat depletion, moderate fat depletion, mild muscle depletion, moderate muscle depletion Interventions: Ensure Enlive (each supplement provides 350kcal and 20 grams of protein), MVI, Liberalize Diet Patient's Body mass index is 19.49 kg/m.  DVT prophylaxis: heparin injection 5,000 Units Start: 06/16/21 0930 Code Status:   Code Status: DNR  Family Communication: plan of care discussed with patient, I had updated patient's son at bedside 9/14.  Status is: Inpatient Remains inpatient  appropriate because:Inpatient level of care appropriate due to severity of illness  Dispo:The patient is from: Home            Anticipated d/c is to: SNF  once leukocytosis improves hopefully next 1 to 2 days            Patient currently is not medically stable            Difficult to place patient No  Unresulted Labs (From admission, onward)     Start     Ordered   07/02/21 9937  Basic metabolic panel  Daily,   R     Question:  Specimen collection method  Answer:  Lab=Lab collect   07/01/21 1712   07/02/21 0500  CBC  Daily,   R     Question:  Specimen collection method  Answer:  Lab=Lab collect   07/01/21 1712           Medications reviewed:  Scheduled Meds:  oxyCODONE  10 mg Oral Q8H   And   acetaminophen  1,000 mg Oral Q8H   amLODipine  10 mg Oral QHS   arformoterol  15 mcg Nebulization BID   And   budesonide (PULMICORT) nebulizer solution  0.25 mg Nebulization BID   And   revefenacin  175 mcg Nebulization Daily   aspirin EC  81 mg Oral Daily   capsaicin   Topical TID   carvedilol  12.5 mg Oral BID   cloNIDine  0.2 mg  Oral QHS   clopidogrel  75 mg Oral Daily   donepezil  5 mg Oral Q2200   DULoxetine  40 mg Oral Daily   feeding supplement  237 mL Oral TID BM   heparin  5,000 Units Subcutaneous Q8H   insulin aspart  0-5 Units Subcutaneous QHS   insulin aspart  0-6 Units Subcutaneous TID WC   lidocaine  2 patch Transdermal Q24H   megestrol  40 mg Oral BID   multivitamin with minerals  1 tablet Oral Daily   pantoprazole  40 mg Oral Daily   polyethylene glycol  17 g Oral Daily   predniSONE  10 mg Oral Q breakfast   pregabalin  75 mg Oral Daily   rosuvastatin  10 mg Oral Daily   sodium bicarbonate  650 mg Oral BID   sodium chloride flush  3 mL Intravenous Q12H   sodium zirconium cyclosilicate  10 g Oral Daily   Continuous Infusions:  sodium chloride 50 mL/hr at 07/02/21 0656   piperacillin-tazobactam (ZOSYN)  IV 2.25 g (07/02/21 0516)   Consultants:see note   Procedures:see note Antimicrobials: Anti-infectives (From admission, onward)    Start     Dose/Rate Route Frequency Ordered Stop   06/30/21 1615  piperacillin-tazobactam (ZOSYN) IVPB 2.25 g        2.25 g 100 mL/hr over 30 Minutes Intravenous Every 8 hours 06/30/21 1518 07/05/21 0559   06/28/21 1230  ampicillin-sulbactam (UNASYN) 1.5 g in sodium chloride 0.9 % 100 mL IVPB  Status:  Discontinued        1.5 g 200 mL/hr over 30 Minutes Intravenous Every 12 hours 06/28/21 1127 06/30/21 1518   06/17/21 0800  valACYclovir (VALTREX) tablet 1,000 mg        1,000 mg Oral Daily 06/16/21 0839 06/22/21 0900   06/16/21 0515  valACYclovir (VALTREX) tablet 1,000 mg        1,000 mg Oral STAT 06/16/21 0502 06/16/21 0543      Culture/Microbiology    Component Value Date/Time   SDES URINE, RANDOM 09/29/2020 2353   SPECREQUEST  09/29/2020 2353    NONE Performed at [facility], [place] [address] [place], [place] [address]    CULT 20,000 COLONIES/mL ENTEROBACTER AEROGENES (A) 09/29/2020 2353   REPTSTATUS 10/01/2020 FINAL 09/29/2020 2353    Other culture-see note  Objective: Vitals: Today's Vitals   07/01/21 1801 07/01/21 2007 07/01/21 2025 07/02/21 0514  BP: 133/78 139/81  134/89  Pulse: 81 77 76 74  Resp: 16 20 18 18   Temp: 98.8 F (37.1 C) 98.6 F (37 C)  98.1 F (36.7 C)  TempSrc: Oral   Oral  SpO2: 95% (!) 72% 92% 98%  Weight:      Height:      PainSc:   0-No pain     Intake/Output Summary (Last 24 hours) at 07/02/2021 0758 Last data filed at 07/02/2021 0516 Gross per 24 hour  Intake 833 ml  Output 100 ml  Net 733 ml    Filed Weights   06/25/21 2139 06/29/21 1017 06/29/21 2141  Weight: 49.9 kg 49.9 kg 49.9 kg   Weight change:   Intake/Output from previous day: 09/14 0701 - 09/15 0700 In: 833 [P.O.:537; I.V.:146; IV Piggyback:150] Out: 100 [Urine:100] Intake/Output this shift: No intake/output data recorded. Filed Weights   06/25/21 2139 06/29/21 1017 06/29/21  2141  Weight: 49.9 kg 49.9 kg 49.9 kg   Examination: General exam: AAO self current place, president , on nasal cannula, elderly frail  HEENT:Oral mucosa moist, Ear/Nose WNL grossly, dentition normal. Respiratory system: bilaterally upper airway entry present diminished on the left lung, no use of accessory muscle Cardiovascular system: S1 & S2 +, No JVD,. Gastrointestinal system: Abdomen soft, NT,ND, BS+ Nervous System:Alert, awake, moving extremities and grossly nonfocal Extremities: no edema, distal peripheral pulses palpable.  Skin: Healing shingles rash on left abdomen and left flank MSK: Normal muscle bulk,tone, power    Data Reviewed: I have personally reviewed following labs and imaging studies CBC: Recent Labs  Lab 06/28/21 0802 06/29/21 0120 06/30/21 0434 07/01/21 0422 07/02/21 0326  WBC 19.8* 22.6* 26.4* 21.1* 15.9*  HGB 8.1* 7.6* 8.0* 7.5* 7.0*  HCT 23.8* 22.5* 24.2* 22.5* 20.9*  MCV 84.7 85.9 85.2 84.9 84.6  PLT 463* 421* 436* 458* 487*    Basic Metabolic Panel: Recent Labs  Lab 06/28/21 0802 06/29/21 0120 06/30/21 0434 07/01/21 0422 07/02/21 0326  NA 139 142 142 140 140  K 5.2* 5.1 5.1 5.1 5.2*  CL 111 111 113* 105 109  CO2 21* 19* 21* 20* 20*  GLUCOSE 120* 214* 124* 84 99  BUN 54* 52* 46* 45* 42*  CREATININE 2.43* 2.42* 2.36* 2.32* 2.63*  CALCIUM 9.0 8.9 9.1 9.0 8.7*    GFR: Estimated Creatinine Clearance: 14.1 mL/min (A) (by C-G formula based on SCr of 2.63 mg/dL (H)). Liver Function Tests: No results for input(s): AST, ALT, ALKPHOS, BILITOT, PROT, ALBUMIN in the last 168 hours.  No results for input(s): LIPASE, AMYLASE in the last 168 hours. No results for input(s): AMMONIA in the last 168 hours. Coagulation Profile: No results for input(s): INR, PROTIME in the last 168 hours. Cardiac Enzymes: No results for input(s): CKTOTAL, CKMB, CKMBINDEX, TROPONINI in the last 168 hours. BNP (last 3 results) No results for input(s): PROBNP in the last  8760 hours. HbA1C: No results for input(s): HGBA1C in the last 72 hours.  CBG: Recent Labs  Lab 07/01/21 0646 07/01/21 1150 07/01/21 1658 07/01/21 2114 07/02/21 0632  GLUCAP 111* 180* 122* 123* 117*    Lipid Profile: No results for input(s): CHOL, HDL, LDLCALC, TRIG, CHOLHDL, LDLDIRECT in the last 72 hours. Thyroid Function Tests: No results for input(s): TSH, T4TOTAL, FREET4, T3FREE, THYROIDAB in the last 72 hours. Anemia Panel: No results for input(s): VITAMINB12, FOLATE, FERRITIN, TIBC, IRON, RETICCTPCT in the last 72 hours. Sepsis Labs: Recent Labs  Lab 06/28/21 1100 06/29/21 0120 06/30/21 0434  PROCALCITON 0.24 0.28 0.39     No results found for this or any previous visit (from the past 240 hour(s)).    Radiology Studies: CT HEAD WO CONTRAST (5MM)  Result Date: 07/02/2021 CLINICAL DATA:  Delirium EXAM: CT HEAD WITHOUT CONTRAST TECHNIQUE: Contiguous axial images were obtained from the base of the skull through the vertex without intravenous contrast. COMPARISON:  09/29/2020 FINDINGS: Brain: There is no mass, hemorrhage or extra-axial collection. There is generalized atrophy without lobar predilection. Hypodensity of the white matter is most commonly associated with chronic microvascular disease. Old left basal ganglia small vessel infarct. Vascular: No abnormal hyperdensity of the major intracranial arteries or dural venous sinuses. No intracranial atherosclerosis. Skull: The visualized skull base, calvarium and extracranial soft tissues are normal. Sinuses/Orbits: Postsurgical changes of right maxillary sinus with mild mucosal thickening. The orbits are normal. IMPRESSION: 1. No acute intracranial abnormality. 2. Generalized atrophy and chronic microvascular ischemia. Electronically Signed   By: Ulyses Jarred M.D.   On: 07/02/2021 01:32     LOS: 13 days   Antonieta Pert, MD [facility]  07/02/2021, 7:58 AM

## 2021-07-02 NOTE — Progress Notes (Addendum)
Soap suds enema administered.

## 2021-07-02 NOTE — TOC Progression Note (Addendum)
Transition of Care [facility]) - Progression Note    Patient Details  Name: Alyssa [name] MRN: [id number] Date of Birth: 1941-07-18  Transition of [facility]) CM/SW Contact  Alyssa Salina Mila Homer, LCSW Phone Number: 07/02/2021, 2:25 PM  Clinical Narrative:  Received a call from Alyssa [name], admissions director at [facility] that insurance denied patient and an expedited appeal can be done. CSW provided with information to submit an expedited appeal to [facility]. Clinicals faxed to [facility] - fax [phone].  CSW will continue to follow and provide SW intervention services as needed through discharge.   Attempted twice to talk with patient (1:53 pm and 5:42 pm) and she was asleep both times and did not awaken when her name was called. CSW attempted to reach son-Alyssa [name] [phone]) earlier today (1:53 pm) and message left. As of 5:47 pm, CSW has not received a return call. Will f/u with patient and also attempt contact with son tomorrow.     Expected Discharge Plan: [place] Barriers to Discharge: Continued Medical Work up  Expected Discharge Plan and Services Expected Discharge Plan: [place] In-house Referral: Clinical Social Work     Living arrangements for the past 2 months: Single Family Home                                     Social Determinants of Health (SDOH) Interventions  No SDOH interventions requested or needed at this time.   Readmission Risk Interventions Readmission Risk Prevention Plan 07/10/2020 12/26/2018  Transportation Screening Complete Complete  Medication Review Press photographer) Complete Complete  PCP or Specialist appointment within 3-5 days of discharge - Not Complete  PCP/Specialist Appt Not Complete comments - pt for snf. Snf MD to see as needed.  [place] or Home Care Consult Complete Not Complete  HRI or Home Care Consult Pt Refusal Comments - NA  SW Recovery Care/Counseling  Consult Complete Not Complete  SW Consult Not Complete Comments - NA  Palliative Care Screening Not Applicable Complete  Skilled Nursing Facility Not Applicable Complete  Some recent data might be hidden

## 2021-07-02 NOTE — Care Management Important Message (Signed)
Important Message  Patient Details  Name: Alyssa [name] MRN: [id number] Date of Birth: [birth year]   Medicare Important Message Given:  Yes     Jurnie Garritano Montine Circle 07/02/2021, 3:36 PM

## 2021-07-02 NOTE — TOC Progression Note (Signed)
Transition of Care [facility]) - Progression Note    Patient Details  Name: VENIDA TSUKAMOTO MRN: [id number] Date of Birth: Oct 06, 1941  Transition of Care [facility]) CM/SW Contact  [name], LCSW Phone Number: 07/02/2021, 1:44 PM  Clinical Narrative:  CSW informed by Juliann Pulse, admissions director at [facility] that [facility] denied SNF request for ST rehab. CSW was provided with the information to initiate an expedited appeal. Clinicals (additional PT/OT notes and MD notes) faxed to [facility] at fax number [phone]. Patient and son updated.  CSW will continue to follow and provide services as needed through discharge.    Expected Discharge Plan: [place] Barriers to Discharge: Continued Medical Work up  Expected Discharge Plan and Services Expected Discharge Plan: [place] In-house Referral: Clinical Social Work     Living arrangements for the past 2 months: Single Family Home                                       Social Determinants of Health (SDOH) Interventions  No SDOH interventions requested or needed at this time   Readmission Risk Interventions Readmission Risk Prevention Plan 07/10/2020 12/26/2018  Transportation Screening Complete Complete  Medication Review Press photographer) Complete Complete  PCP or Specialist appointment within 3-5 days of discharge - Not Complete  PCP/Specialist Appt Not Complete comments - pt for snf. Snf MD to see as needed.  [place] or Home Care Consult Complete Not Complete  HRI or Home Care Consult Pt Refusal Comments - NA  SW Recovery Care/Counseling Consult Complete Not Complete  SW Consult Not Complete Comments - NA  Palliative Care Screening Not Applicable Complete  Skilled Nursing Facility Not Applicable Complete  Some recent data might be hidden

## 2021-07-02 NOTE — Progress Notes (Signed)
Occupational Therapy Treatment Patient Details Name: Alyssa [name] MRN: [id number] DOB: 10-19-1940 Today's Date: 07/02/2021   History of present illness 80 y/o female admitted 8/29  secondary to worsening shortness of breath, rash in the left flank/abdomen that progressed to the back, productive cough and weight loss. Found to have shingles, hypoxic respiratory failure and hypertensive urgency. PMH inclues PVD, COPD, HTN, and covid.   OT comments  Patient in bed and agreeable to sitting on eob to perform grooming.  Patient was mod assist working towards eob but asked to go back to supine due to pain in back area.  Grooming and UE ROM exercises performed at bed level due to pain.  Nursing notified.  Acute OT to continue to follow.    Recommendations for follow up therapy are one component of a multi-disciplinary discharge planning process, led by the attending physician.  Recommendations may be updated based on patient status, additional functional criteria and insurance authorization.    Follow Up Recommendations  SNF    Equipment Recommendations  3 in 1 bedside commode;Wheelchair (measurements OT);Wheelchair cushion (measurements OT)    Recommendations for Other Services      Precautions / Restrictions Precautions Precautions: Fall Precaution Comments:  (back pain)       Mobility Bed Mobility Overal bed mobility: Needs Assistance Bed Mobility: Sidelying to Sit Rolling: Min guard   Supine to sit: Mod assist     General bed mobility comments: patient was unable to completely get to eob due to pain    Transfers                      Balance                                           ADL either performed or assessed with clinical judgement   ADL Overall ADL's : Needs assistance/impaired     Grooming: Wash/dry hands;Wash/dry face;Oral care;Supervision/safety;Cueing for sequencing;Bed level Grooming Details (indicate cue type and reason):  patient performed bed level due to pain limiting patient from getting out of bed                               General ADL Comments: patient limited mobility this session due to pain     Vision       Perception     Praxis      Cognition Arousal/Alertness: Awake/alert Behavior During Therapy: WFL for tasks assessed/performed Overall Cognitive Status: No family/caregiver present to determine baseline cognitive functioning                                 General Comments: was willing to participate but unable to get up from bed due to pain        Exercises Exercises: General Upper Extremity General Exercises - Upper Extremity Shoulder Flexion: AROM;Both;10 reps;Supine Shoulder ABduction: AROM;Both;10 reps;Supine Wrist Flexion: AROM;Both;10 reps   Shoulder Instructions       General Comments      Pertinent Vitals/ Pain       Pain Assessment: 0-10 Pain Score: 9  Faces Pain Scale: Hurts whole lot Pain Location: back Pain Descriptors / Indicators: Aching;Grimacing;Moaning Pain Intervention(s): Patient requesting pain meds-RN notified;Limited activity within patient's tolerance  Home Living  Prior Functioning/Environment              Frequency  Min 2X/week        Progress Toward Goals  OT Goals(current goals can now be found in the care plan section)  Progress towards OT goals: Progressing toward goals  Acute Rehab OT Goals Patient Stated Goal: feel better OT Goal Formulation: With patient Time For Goal Achievement: 07/04/21 Potential to Achieve Goals: Fair ADL Goals Pt Will Perform Grooming: with set-up;sitting;standing Pt Will Perform Lower Body Dressing: with set-up;sit to/from stand Pt Will Transfer to Toilet: with modified independence;ambulating;regular height toilet Pt/caregiver will Perform Home Exercise Program: Increased strength;Both right and left upper  extremity;With theraband;With Supervision;With written HEP provided  Plan Discharge plan remains appropriate    Co-evaluation                 AM-PAC OT "6 Clicks" Daily Activity     Outcome Measure   Help from another person eating meals?: None Help from another person taking care of personal grooming?: A Little Help from another person toileting, which includes using toliet, bedpan, or urinal?: A Lot Help from another person bathing (including washing, rinsing, drying)?: A Lot Help from another person to put on and taking off regular upper body clothing?: A Little Help from another person to put on and taking off regular lower body clothing?: A Lot 6 Click Score: 16    End of Session Equipment Utilized During Treatment: Oxygen  OT Visit Diagnosis: Unsteadiness on feet (R26.81);Muscle weakness (generalized) (M62.81);Pain Pain - part of body:  (back)   Activity Tolerance Patient limited by pain   Patient Left in bed;with call bell/phone within reach;with bed alarm set   Nurse Communication Patient requests pain meds        Time: 3976-7341 OT Time Calculation (min): 23 min  Charges: OT General Charges $OT Visit: 1 Visit OT Treatments $Self Care/Home Management : 8-22 mins $Therapeutic Exercise: 8-22 mins  Alyssa [name], OTA   Alyssa [name] 07/02/2021, 10:09 AM

## 2021-07-03 DIAGNOSIS — K5900 Constipation, unspecified: Secondary | ICD-10-CM

## 2021-07-03 DIAGNOSIS — N184 Chronic kidney disease, stage 4 (severe): Secondary | ICD-10-CM | POA: Diagnosis not present

## 2021-07-03 DIAGNOSIS — I259 Chronic ischemic heart disease, unspecified: Secondary | ICD-10-CM | POA: Diagnosis not present

## 2021-07-03 DIAGNOSIS — R0602 Shortness of breath: Secondary | ICD-10-CM | POA: Diagnosis not present

## 2021-07-03 LAB — CBC
HCT: 20.1 % — ABNORMAL LOW (ref 36.0–46.0)
Hemoglobin: 6.5 g/dL — CL (ref 12.0–15.0)
MCH: 27.8 pg (ref 26.0–34.0)
MCHC: 32.3 g/dL (ref 30.0–36.0)
MCV: 85.9 fL (ref 80.0–100.0)
Platelets: 463 10*3/uL — ABNORMAL HIGH (ref 150–400)
RBC: 2.34 MIL/uL — ABNORMAL LOW (ref 3.87–5.11)
RDW: 18.4 % — ABNORMAL HIGH (ref 11.5–15.5)
WBC: 13.8 10*3/uL — ABNORMAL HIGH (ref 4.0–10.5)
nRBC: 0 % (ref 0.0–0.2)

## 2021-07-03 LAB — GLUCOSE, CAPILLARY
Glucose-Capillary: 89 mg/dL (ref 70–99)
Glucose-Capillary: 96 mg/dL (ref 70–99)
Glucose-Capillary: 231 mg/dL — ABNORMAL HIGH (ref 70–99)
Glucose-Capillary: 123 mg/dL — ABNORMAL HIGH (ref 70–99)

## 2021-07-03 LAB — PREPARE RBC (CROSSMATCH)

## 2021-07-03 LAB — HEMOGLOBIN AND HEMATOCRIT, BLOOD
HCT: 25.6 % — ABNORMAL LOW (ref 36.0–46.0)
Hemoglobin: 8.6 g/dL — ABNORMAL LOW (ref 12.0–15.0)

## 2021-07-03 LAB — BASIC METABOLIC PANEL
Anion gap: 14 (ref 5–15)
BUN: 40 mg/dL — ABNORMAL HIGH (ref 8–23)
CO2: 19 mmol/L — ABNORMAL LOW (ref 22–32)
Calcium: 8.6 mg/dL — ABNORMAL LOW (ref 8.9–10.3)
Chloride: 107 mmol/L (ref 98–111)
Creatinine, Ser: 2.73 mg/dL — ABNORMAL HIGH (ref 0.44–1.00)
GFR, Estimated: 17 mL/min — ABNORMAL LOW (ref >60)
Glucose, Bld: 92 mg/dL (ref 70–99)
Potassium: 5 mmol/L (ref 3.5–5.1)
Sodium: 140 mmol/L (ref 135–145)

## 2021-07-03 MED ORDER — POLYETHYLENE GLYCOL 3350 17 G PO PACK
17.0000 g | PACK | Freq: Two times a day (BID) | ORAL | Status: DC
  Administered 2021-07-03 – 2021-07-07 (×5): 17 g via ORAL
  Filled 2021-07-03 (×5): qty 1

## 2021-07-03 MED ORDER — ACETAMINOPHEN 500 MG PO TABS
1000.0000 mg | ORAL_TABLET | Freq: Three times a day (TID) | ORAL | Status: DC
  Administered 2021-07-03 – 2021-07-06 (×9): 1000 mg via ORAL
  Filled 2021-07-03 (×9): qty 2

## 2021-07-03 MED ORDER — PREDNISONE 5 MG PO TABS
5.0000 mg | ORAL_TABLET | Freq: Every day | ORAL | Status: DC
  Administered 2021-07-04: 5 mg via ORAL
  Filled 2021-07-03: qty 1

## 2021-07-03 MED ORDER — OXYCODONE HCL 5 MG PO TABS
7.5000 mg | ORAL_TABLET | Freq: Three times a day (TID) | ORAL | Status: DC
Start: 2021-07-03 — End: 2021-07-06
  Administered 2021-07-03 – 2021-07-06 (×9): 7.5 mg via ORAL
  Filled 2021-07-03 (×9): qty 2

## 2021-07-03 MED ORDER — SODIUM CHLORIDE 0.9% IV SOLUTION: Freq: Once | INTRAVENOUS | Status: AC

## 2021-07-03 NOTE — Progress Notes (Signed)
   Palliative Medicine Inpatient Follow Up Note   Chart reviewed. Patient examined at the bedside.   No acute distress noted. Patient is resting but easily awakened. Answers all orientation questions appropriately. Reports her pain is much better and she feels better since she received the enema. She is asking about when she will get out of the hospital and go to rehab.   Discussed bowel regimen to prevent further complications with constipation. She verbalized appreciation.   Questions and concerns addressed   Objective Assessment: Vital Signs Vitals:   07/03/21 1132 07/03/21 1133  BP:  (!) 144/78  Pulse:  79  Resp:  16  Temp: 98.6 F (37 C) 98.6 F (37 C)  SpO2:  96%    Intake/Output Summary (Last 24 hours) at 07/03/2021 1350 Last data filed at 07/03/2021 1000 Gross per 24 hour  Intake 1170.96 ml  Output --  Net 1170.96 ml   Last Weight  Most recent update: 06/30/2021  3:30 AM    Weight  49.9 kg (110 lb 0.2 oz)            Gen:  NAD HEENT: moist mucous membranes CV: RRR PULM: clear to auscultation bilaterally.  ABD: soft/nontender/nondistended/normal bowel sounds Neuro: Alert and oriented x3, mood appropriate, follows commands  SUMMARY OF RECOMMENDATIONS   -Continue with current plan of care  -Agree with decreasing scheduled Oxy IR to 7.5mg  from 10 mg (pain is much better, has only received 1 PRN dose 5mg  Oxy IR over past 24 hours) -Receiving Miralax daily, given constipation and requiring recent enema increase Miralax to twice daily in the setting of opioid use.  -Continue with PRN dulcolax suppository -Senna PRN -PMT will continue to support and follow on an as needed basis. Pain is much more controlled. Please secure chat or call with urgent needs.    Time Total: 40 min.   Visit consisted of counseling and education dealing with the complex and emotionally intense issues of symptom management and palliative care in the setting of serious and potentially  life-threatening illness.Greater than 50%  of this time was spent counseling and coordinating care related to the above assessment and plan.  [name], AGPCNP-BC  Palliative Medicine Team [phone]  Palliative Medicine Team providers are available by phone from 7am to 7pm daily and can be reached through the team cell phone.  Should this patient require assistance outside of these hours, please call the patient's attending physician.

## 2021-07-03 NOTE — TOC Progression Note (Addendum)
Transition of Care [facility]) - Progression Note    Patient Details  Name: Alyssa [name] MRN: [id number] Date of Birth: 1941/08/28  Transition of [facility]) CM/SW Contact  [name], LCSW Phone Number: 07/03/2021, 1:41 PM  Clinical Narrative:  Went to room (1:39 pm) hoping to talk with patient regarding insurance denial and expedited appeal, however she was asleep. Called [name] [phone]) and left message. CSW will attempt to talk with patient later today.  2:02 pm: Received call from [name] and he was updated regarding insurance denying request for SNF authorization and an expedited appeal was done. Son advised that CSW waiting to hear back from insurance regarding the expedited appeal, and that he will be contacted once we are advised of the appeal decision. Son expressed appreciation for the update.   Expected Discharge Plan: [place] Barriers to Discharge: Continued Medical Work up  Expected Discharge Plan and Services Expected Discharge Plan: [place] In-house Referral: Clinical Social Work     Living arrangements for the past 2 months: Single Family Home                                       Social Determinants of Health (SDOH) Interventions  No SDOH interventions requested or needed at this time.  Readmission Risk Interventions Readmission Risk Prevention Plan 07/10/2020 12/26/2018  Transportation Screening Complete Complete  Medication Review [employer]) Complete Complete  PCP or Specialist appointment within 3-5 days of discharge - Not Complete  PCP/Specialist Appt Not Complete comments - pt for snf. Snf MD to see as needed.  [place] or Home Care Consult Complete Not Complete  HRI or Home Care Consult Pt Refusal Comments - NA  SW Recovery Care/Counseling Consult Complete Not Complete  SW Consult Not Complete Comments - NA  Palliative Care Screening Not Applicable Complete  Skilled Nursing  Facility Not Applicable Complete  Some recent data might be hidden

## 2021-07-03 NOTE — Progress Notes (Signed)
Nutrition Follow-up  DOCUMENTATION CODES:  Underweight, Non-severe (moderate) malnutrition in context of chronic illness  INTERVENTION:  -Continue Ensure Enlive po TID, each supplement provides 350 kcal and 20 grams of protein -Continue MVI with minerals daily  Recommend transitioning pt back to regular diet given ongoing inadequate intake, fluid restriction may be continued despite diet being otherwise liberalized.   NUTRITION DIAGNOSIS:  Moderate Malnutrition related to chronic illness (dementia, COPD) as evidenced by mild fat depletion, moderate fat depletion, mild muscle depletion, moderate muscle depletion. -- ongoing  GOAL:  Patient will meet greater than or equal to 90% of their needs -- not met  MONITOR:  PO intake, Supplement acceptance, Labs, Weight trends, Skin, I & O's  REASON FOR ASSESSMENT:  Malnutrition Screening Tool    ASSESSMENT:  Alyssa [name] is a 80 y.o. female with medical history significant of hypertension, hyperlipidemia, COPD, PVD, CKD stage stage IV, COVID-19 infection 2021, dementia presents with complaints of pain all over for the last 3 days.  Admits that a rash broke out on the left side of her abdomen and goes around to her back and states that it is very painful.  She states that she has been more short of breath and has had a productive cough with " cloudy" sputum production.  Associated symptoms include wheezing, chest pain, left hip pain, 15 pound weight loss over the last month, and generalized malaise.  Notes that she has not had an appetite.  Denies having any significant fever, nausea, or vomiting.  Patient has not tried anything to treat symptoms at home prior to coming to the hospital.  She reports quitting smoking about 1 month ago.  Records note patient has been having difficulty with shortness of breath for more than an months now  Pt admitted with herpes zoster and dyspnea secondary to L lung collapse.   Pt reports improvement in pain  and discomfort after relief of her constipation. Mentation and appetite both improving, though intake remains inadequate to meet needs. Pt has eaten 25-50% of all offered meals since last RD assessment (32% average meal intake). Pt continues to do well with supplementation per RN (Ensure Enlive/Plus TID). RD reached out to MD via Secure Chat to discuss potential of liberalizing diet to increase PO intake. Pt eager to discharge to rehab.   Medications: SSI, Megace, MVI with minerals, protonix, miralax, deltasone, sodium bicarbonate, lokelma Labs: BUN 40 (H), Cr 2.73 (H), hgb 6.5 (L) CBGs: 89-158 x24 hours  Admission weight: 45 kg Current weight: 49.9 kg  UOP: 366mL documented x24 hours I/O: +6L since admit  Diet Order:   Diet Order             Diet renal with fluid restriction Fluid restriction: 1200 mL Fluid; Room service appropriate? Yes; Fluid consistency: Thin  Diet effective now                  EDUCATION NEEDS:  Education needs have been addressed  Skin:  Skin Assessment: Skin Integrity Issues: Skin Integrity Issues:: Other (Comment) Other: rash and blisters on lower abdomen  Last BM:  9/15  Height:  Ht Readings from Last 1 Encounters:  06/16/21 $RemoveB'5\' 3"'xMrneQnC$  (1.6 m)   Weight:  Wt Readings from Last 1 Encounters:  06/29/21 49.9 kg   Ideal Body Weight:  52.3 kg  BMI:  Body mass index is 19.49 kg/m.  Estimated Nutritional Needs:  Kcal:  1600-1800 Protein:  90-105 grams Fluid:  > 1.6 L    Estill Bamberg  Samia Kukla, MS, RD, LDN (she/her/hers) RD pager number and weekend/on-call pager number located in [place].

## 2021-07-03 NOTE — Progress Notes (Signed)
Notified of critical hemoglobin of 6.5. notified doc on call and type and screen and 1 unit ordered. Consent signed

## 2021-07-03 NOTE — Progress Notes (Signed)
Physical Therapy Treatment Patient Details Name: ABIGAILE ROSSIE MRN: [id number] DOB: 1941-02-06 Today's Date: 07/03/2021   History of Present Illness 80 y/o female admitted 8/29  secondary to worsening shortness of breath, rash in the left flank/abdomen that progressed to the back, productive cough and weight loss. Found to have shingles, hypoxic respiratory failure and hypertensive urgency. PMH inclues PVD, COPD, HTN, and covid.    PT Comments    PT updates goals and POC based on pt current functional status. Pt remains generally weak, requiring physical assistance for all out of bed mobility and remaining at a high falls risk due to imbalance and poor awareness of deficits. Pt is limited by both pain and impaired endurance, only tolerating brief bouts of mobility separated by short seated rest breaks. Pt also incontinent during most standing periods. Pt will benefit from continued aggressive mobilization to aide in a return to independence. PT continues to recommend SNF placement as the pt is far below her baseline, typically ambulating independently at home, and remains at a high risk for falls.  Recommendations for follow up therapy are one component of a multi-disciplinary discharge planning process, led by the attending physician.  Recommendations may be updated based on patient status, additional functional criteria and insurance authorization.  Follow Up Recommendations  SNF     Equipment Recommendations  Wheelchair (measurements PT);Wheelchair cushion (measurements PT)    Recommendations for Other Services       Precautions / Restrictions Precautions Precautions: Fall Restrictions Weight Bearing Restrictions: No     Mobility  Bed Mobility Overal bed mobility: Needs Assistance Bed Mobility: Supine to Sit;Sit to Supine     Supine to sit: Supervision;HOB elevated Sit to supine: Min assist        Transfers Overall transfer level: Needs assistance Equipment  used: 1 person hand held assist Transfers: Sit to/from [employer] Sit to Stand: Min assist   Squat pivot transfers: Min assist     General transfer comment: pt requiring assistance for all transfers due to weakness and instability. Pt often incontinent during transfers  Ambulation/Gait                 Stairs             Wheelchair Mobility    Modified Rankin (Stroke Patients Only)       Balance Overall balance assessment: Needs assistance Sitting-balance support: No upper extremity supported;Feet supported Sitting balance-Leahy Scale: Fair     Standing balance support: Single extremity supported;Bilateral upper extremity supported Standing balance-Leahy Scale: Poor Standing balance comment: reliant on UE support and minA, pt refusing use of walker                            Cognition Arousal/Alertness: Awake/alert Behavior During Therapy: WFL for tasks assessed/performed Overall Cognitive Status: Impaired/Different from baseline Area of Impairment: Awareness;Safety/judgement                         Safety/Judgement: Decreased awareness of safety;Decreased awareness of deficits Awareness: Intellectual          Exercises      General Comments General comments (skin integrity, edema, etc.): SpO2 often with unreliable reading with mobility. sats stable on room air at rest      Pertinent Vitals/Pain Pain Assessment: Faces Faces Pain Scale: Hurts worst Pain Location: back Pain Descriptors / Indicators: Grimacing Pain Intervention(s): Monitored during session  Home Living                      Prior Function            PT Goals (current goals can now be found in the care plan section) Acute Rehab PT Goals Patient Stated Goal: feel better PT Goal Formulation: With patient Time For Goal Achievement: 07/17/21 Potential to Achieve Goals: Fair Progress towards PT goals: Not progressing toward goals  - comment (pain limiting)    Frequency    Min 2X/week      PT Plan Current plan remains appropriate    Co-evaluation              AM-PAC PT "6 Clicks" Mobility   Outcome Measure  Help needed turning from your back to your side while in a flat bed without using bedrails?: A Little Help needed moving from lying on your back to sitting on the side of a flat bed without using bedrails?: A Little Help needed moving to and from a bed to a chair (including a wheelchair)?: A Little Help needed standing up from a chair using your arms (e.g., wheelchair or bedside chair)?: A Little Help needed to walk in hospital room?: Total Help needed climbing 3-5 steps with a railing? : Total 6 Click Score: 14    End of Session   Activity Tolerance: Patient limited by pain Patient left: in bed;with call bell/phone within reach;with bed alarm set Nurse Communication: Mobility status PT Visit Diagnosis: Muscle weakness (generalized) (M62.81);Other abnormalities of gait and mobility (R26.89) Pain - part of body:  (back)     Time: 7092-9574 PT Time Calculation (min) (ACUTE ONLY): 42 min  Charges:  $Therapeutic Activity: 38-52 mins                    Zenaida Niece, PT, DPT Acute Rehabilitation Pager: [phone]    Zenaida Niece 07/03/2021, 4:35 PM

## 2021-07-03 NOTE — Progress Notes (Signed)
PROGRESS NOTE    Alyssa [name]  IWP:[id number] DOB: 03-20-1941 DOA: 06/15/2021 PCP: [name], DO   Chief Complaint  Patient presents with   Generalized Body Aches   Brief Narrative: 80 year old female with hypertension, HLD, COPD, PVD, CKD stage IV, COVID-19 infection in 2021, dementia presents with pain all over x3 days along with a rash.  She was seen in the ED creatinine 3.1, troponin 31 chest x-ray left lower lobe collapse COVID-19 negative CT of the chest abdomen pelvis showed left lower lobe collapse without visible mass or adenopathy and femoral bypass graft with increased fluid collection and bypass anastomosis measuring 4.8 X4.1 previously 2.6 X2.6 Patient was given valacyclovir IV fluids and admitted for further management for dyspnea due to left lung collapse, hypertension urgency, weight loss, borderline elevated troponin, nongap metabolic acidosis, anemia. Patient was managed with Valtrex, pain control supplemental oxygen. Seen by PT OT and skilled nursing facility recommended. Pain medication adjusted for pain control ,IR consulted but unable to do nerve block. 9/12-anesthesia was able to inject ropivacaine at T11 level-with some improvement in her pain 9/13-antibiotic were changed to Zosyn -WBC count downtrending. Mild confusion-??  History of dementia may have MCI per son was not diagnosed dementia previously.  Suspecting delirium, CT head 9/14 no acute finding  Subjective: Patient appears more alert awake pleasant. Reports less pain this morning after having big bowel movement after enema Overnight hemoglobin down trended 6.5 gm- 1 unit PRBC ordered and pending. 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  Continue supplemental oxygen, bronchodilators PT OT. PCCM planning to repeat CT chest and outpatient follow-up in a month-if still not reexpanded then may need inspection bronchoscopy.At risk of developing pneumonia.  Acute Shingles/herpes zoster viral involving up to T11 dermatome involving the left flank and left abdomen: Completed Valtrex, rash crusting healing off airborne isolation.  Currently managing pain with multiple regimen including Tylenol opiates-oxycodone 10 mg every 8 hours, intermittent as needed oxycodone Lyrica Cymbalta capsicum/lidocaine  patch- s/p rupivacaine injection at T11 on 9/12.  On a steroid. Pain seems to be improving after relief of her constipation and I am hoping we can start to cut down on oxycodone  to 7.5 mg from 10.  Mild confusion-??  History of dementia may have MCI per son was not diagnosed dementia previously.  Suspecting delirium, CT head 9/14 no acute finding.  UA grossly abnormal but already covered with antibiotics- possible UTI.  She is able to move all her extremities fine.    Leukocytosis:counts  improving nicely after switching to Zosyn 9/13 , procalcitonin 0.3, fortunately remains afebrile, suspect in the setting of her atelectasis, at risk of pneumonia.  Monitor. Recent Labs  Lab 06/28/21 1100 06/29/21 0120 06/30/21 0434 07/01/21 0422 07/02/21 0326 07/03/21 0417  WBC  --  22.6* 26.4* 21.1* 15.9* 13.8*  PROCALCITON 0.24 0.28 0.39  --   --   --    Constipation/fecal impaction: Status post multiple enema MiraLAX suppository and had a big bowel movement.  Continue on daily MiraLAX stool softeners minimize narcotic.   Hypertension urgency: BP fairly controlled on amlodipine, Coreg, clonidine.  prn oral hydralazine.  CKD stage IV/metabolic  acidosis-previous creatinine ~2.6-2.9.  Overall stable  on gentle IV fluids and oral bicarb.  At risk of worsening renal  failure Recent Labs  Lab 06/29/21 0120 06/30/21 0434 07/01/21 0422 07/02/21 0326 07/03/21 0417  BUN 52* 46* 45* 42* 40*  CREATININE 2.42* 2.36* 2.32* 2.63* 2.73*   Hyperkalemia  continue on Lokelma scheduled, renal diet  Recent Labs  Lab 06/29/21 0120 06/30/21 0434 07/01/21 0422 07/02/21 0326 07/03/21 0417  K 5.1 5.1 5.1 5.2* 5.0    PVD- Fem fem bypass  in 2011/CT on admission showing fluid collection from pseudoaneurysm, seen by Dr. 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 she is on Plavix aspirin and statin appreciate input from vascular.  History of tobacco abuse-has quit few months ago Moderate protein calorie malnutrition: Augment diet as tolerated Dementia ??:  supportive care.    Deconditioning: Cont PT OT and will need SNF  HLD Cont statin Anemia of chronic kidney disease:Hb down trended 6.5 GM likely from hemodilution from IV fluids, getting 1 unit PRBC monitor H&H  Recent Labs  Lab 06/29/21 0120 06/30/21 0434 07/01/21 0422 07/02/21 0326 07/03/21 0417  HGB 7.6* 8.0* 7.5* 7.0* 6.5*  HCT 22.5* 24.2* 22.5* 20.9* 20.1*  Depression with anxiety-mood stable  Goals of care: DNR, seen by palliative care.  Prognosis does not appear bright in this patient with multiple comorbidities.    Diet Order             Diet renal with fluid restriction Fluid restriction: 1200 mL Fluid; Room service appropriate? Yes; Fluid consistency: Thin  Diet effective now                   Nutrition Problem: Moderate Malnutrition Etiology: chronic illness (dementia, COPD) Signs/Symptoms: mild fat depletion, moderate fat depletion, mild muscle depletion, moderate muscle depletion Interventions: Ensure Enlive (each supplement provides 350kcal and 20 grams of protein), MVI, Liberalize Diet Patient's Body mass index is 19.49 kg/m.  DVT prophylaxis: heparin  injection 5,000 Units Start: 06/16/21 0930 Code Status:   Code Status: DNR  Family Communication: plan of care discussed with patient, I had updated patient's son at bedside 9/14.  Status is: Inpatient Remains inpatient appropriate because:Inpatient level of care appropriate due to severity of illness  Dispo:The patient is from: Home            Anticipated d/c is to: SNF  once leukocytosis improves hopefully next 1 to 2 days            Patient currently is not medically stable            Difficult to place patient No  Unresulted Labs (From admission, onward)     Start     Ordered   07/02/21 6967  Basic metabolic panel  Daily,   R     Question:  Specimen collection method  Answer:  Lab=Lab collect   07/01/21 1712   07/02/21 0500  CBC  Daily,   R     Question:  Specimen collection method  Answer:  Lab=Lab collect   07/01/21 1712           Medications reviewed:  Scheduled Meds:  oxyCODONE  10 mg Oral Q8H   And   acetaminophen  1,000 mg Oral Q8H   amLODipine  10 mg Oral QHS   arformoterol  15 mcg Nebulization BID   And   budesonide (PULMICORT) nebulizer solution  0.25 mg Nebulization  BID   And   revefenacin  175 mcg Nebulization Daily   aspirin EC  81 mg Oral Daily   capsaicin   Topical TID   carvedilol  12.5 mg Oral BID   cloNIDine  0.2 mg Oral QHS   clopidogrel  75 mg Oral Daily   donepezil  5 mg Oral Q2200   DULoxetine  40 mg Oral Daily   feeding supplement  237 mL Oral TID BM   heparin  5,000 Units Subcutaneous Q8H   insulin aspart  0-5 Units Subcutaneous QHS   insulin aspart  0-6 Units Subcutaneous TID WC   lidocaine  2 patch Transdermal Q24H   megestrol  40 mg Oral BID   multivitamin with minerals  1 tablet Oral Daily   pantoprazole  40 mg Oral Daily   polyethylene glycol  17 g Oral Daily   predniSONE  10 mg Oral Q breakfast   pregabalin  75 mg Oral Daily   rosuvastatin  10 mg Oral Daily   sodium bicarbonate  650 mg Oral BID   sodium chloride flush  3 mL  Intravenous Q12H   sodium zirconium cyclosilicate  10 g Oral Daily   Continuous Infusions:  sodium chloride 50 mL/hr at 07/03/21 0521   piperacillin-tazobactam (ZOSYN)  IV 2.25 g (07/03/21 0523)   Consultants:see note  Procedures:see note Antimicrobials: Anti-infectives (From admission, onward)    Start     Dose/Rate Route Frequency Ordered Stop   06/30/21 1615  piperacillin-tazobactam (ZOSYN) IVPB 2.25 g        2.25 g 100 mL/hr over 30 Minutes Intravenous Every 8 hours 06/30/21 1518 07/05/21 0559   06/28/21 1230  ampicillin-sulbactam (UNASYN) 1.5 g in sodium chloride 0.9 % 100 mL IVPB  Status:  Discontinued        1.5 g 200 mL/hr over 30 Minutes Intravenous Every 12 hours 06/28/21 1127 06/30/21 1518   06/17/21 0800  valACYclovir (VALTREX) tablet 1,000 mg        1,000 mg Oral Daily 06/16/21 0839 06/22/21 0900   06/16/21 0515  valACYclovir (VALTREX) tablet 1,000 mg        1,000 mg Oral STAT 06/16/21 0502 06/16/21 0543      Culture/Microbiology    Component Value Date/Time   SDES URINE, RANDOM 09/29/2020 2353   SPECREQUEST  09/29/2020 2353    NONE Performed at [facility], [place] [address] [place], [place] [address]    CULT 20,000 COLONIES/mL ENTEROBACTER AEROGENES (A) 09/29/2020 2353   REPTSTATUS 10/01/2020 FINAL 09/29/2020 2353    Other culture-see note  Objective: Vitals: Today's Vitals   07/03/21 0518 07/03/21 0811 07/03/21 0828 07/03/21 1103  BP: 125/73 104/60  128/86  Pulse: 80 83  76  Resp: 18 16  16   Temp: 97.6 F (36.4 C) 98.6 F (37 C)  98.4 F (36.9 C)  TempSrc: Oral Oral  Oral  SpO2: 100%  100% 100%  Weight:      Height:      PainSc:        Intake/Output Summary (Last 24 hours) at 07/03/2021 1125 Last data filed at 07/03/2021 0300 Gross per 24 hour  Intake 849.8 ml  Output --  Net 849.8 ml   Filed Weights   06/25/21 2139 06/29/21 1017 06/29/21 2141  Weight: 49.9 kg 49.9 kg 49.9 kg   Weight change:   Intake/Output from previous  day: 09/15 0701 - 09/16 0700 In: 1075.2 [P.O.:240; I.V.:685.2; IV Piggyback:150] Out: 300 [Urine:300] Intake/Output this shift: No  intake/output data recorded. Filed Weights   06/25/21 2139 06/29/21 1017 06/29/21 2141  Weight: 49.9 kg 49.9 kg 49.9 kg   Examination: General exam: AAO to place current month current president, elderly frail HEENT:Oral mucosa moist, Ear/Nose WNL grossly, dentition normal. Respiratory system: bilaterally diminished left more than right, no use of accessory muscle Cardiovascular system: S1 & S2 +, No JVD,. Gastrointestinal system: Abdomen soft, NT,ND, BS+ Nervous System:Alert, awake, moving extremities and grossly nonfocal Extremities:  edema, distal peripheral pulses palpable.  Skin: healed shingles rash on her left abdomen and left flank,no icterus. MSK: thin muscle bulk,tone, power     Data Reviewed: I have personally reviewed following labs and imaging studies CBC: Recent Labs  Lab 06/29/21 0120 06/30/21 0434 07/01/21 0422 07/02/21 0326 07/03/21 0417  WBC 22.6* 26.4* 21.1* 15.9* 13.8*  HGB 7.6* 8.0* 7.5* 7.0* 6.5*  HCT 22.5* 24.2* 22.5* 20.9* 20.1*  MCV 85.9 85.2 84.9 84.6 85.9  PLT 421* 436* 458* 487* 510*   Basic Metabolic Panel: Recent Labs  Lab 06/29/21 0120 06/30/21 0434 07/01/21 0422 07/02/21 0326 07/03/21 0417  NA 142 142 140 140 140  K 5.1 5.1 5.1 5.2* 5.0  CL 111 113* 105 109 107  CO2 19* 21* 20* 20* 19*  GLUCOSE 214* 124* 84 99 92  BUN 52* 46* 45* 42* 40*  CREATININE 2.42* 2.36* 2.32* 2.63* 2.73*  CALCIUM 8.9 9.1 9.0 8.7* 8.6*   GFR: Estimated Creatinine Clearance: 13.6 mL/min (A) (by C-G formula based on SCr of 2.73 mg/dL (H)). Liver Function Tests: No results for input(s): AST, ALT, ALKPHOS, BILITOT, PROT, ALBUMIN in the last 168 hours.  No results for input(s): LIPASE, AMYLASE in the last 168 hours. No results for input(s): AMMONIA in the last 168 hours. Coagulation Profile: No results for input(s): INR, PROTIME  in the last 168 hours. Cardiac Enzymes: No results for input(s): CKTOTAL, CKMB, CKMBINDEX, TROPONINI in the last 168 hours. BNP (last 3 results) No results for input(s): PROBNP in the last 8760 hours. HbA1C: No results for input(s): HGBA1C in the last 72 hours.  CBG: Recent Labs  Lab 07/02/21 0632 07/02/21 1145 07/02/21 1758 07/02/21 2119 07/03/21 0722  GLUCAP 117* 124* 158* 101* 89   Lipid Profile: No results for input(s): CHOL, HDL, LDLCALC, TRIG, CHOLHDL, LDLDIRECT in the last 72 hours. Thyroid Function Tests: No results for input(s): TSH, T4TOTAL, FREET4, T3FREE, THYROIDAB in the last 72 hours. Anemia Panel: No results for input(s): VITAMINB12, FOLATE, FERRITIN, TIBC, IRON, RETICCTPCT in the last 72 hours. Sepsis Labs: Recent Labs  Lab 06/28/21 1100 06/29/21 0120 06/30/21 0434  PROCALCITON 0.24 0.28 0.39    No results found for this or any previous visit (from the past 240 hour(s)).    Radiology Studies: DG Abd 1 View  Result Date: 07/02/2021 CLINICAL DATA:  Constipation EXAM: ABDOMEN - 1 VIEW COMPARISON:  12/10/2018 FINDINGS: Nonobstructed bowel-gas pattern with mild to moderate stool. Radiopaque material in the rectosigmoid colon. Hardware in the lumbar spine and right hip. Chronic osseous deformity of the right hip. Probable calcified uterine fibroids IMPRESSION: Nonobstructed gas pattern with mild to moderate stool in the colon Electronically Signed   By: Donavan Foil M.D.   On: 07/02/2021 17:19   CT HEAD WO CONTRAST (5MM)  Result Date: 07/02/2021 CLINICAL DATA:  Delirium EXAM: CT HEAD WITHOUT CONTRAST TECHNIQUE: Contiguous axial images were obtained from the base of the skull through the vertex without intravenous contrast. COMPARISON:  09/29/2020 FINDINGS: Brain: There is no mass, hemorrhage or  extra-axial collection. There is generalized atrophy without lobar predilection. Hypodensity of the white matter is most commonly associated with chronic microvascular  disease. Old left basal ganglia small vessel infarct. Vascular: No abnormal hyperdensity of the major intracranial arteries or dural venous sinuses. No intracranial atherosclerosis. Skull: The visualized skull base, calvarium and extracranial soft tissues are normal. Sinuses/Orbits: Postsurgical changes of right maxillary sinus with mild mucosal thickening. The orbits are normal. IMPRESSION: 1. No acute intracranial abnormality. 2. Generalized atrophy and chronic microvascular ischemia. Electronically Signed   By: Ulyses Jarred M.D.   On: 07/02/2021 01:32     LOS: 14 days   Antonieta Pert, MD [facility]  07/03/2021, 11:25 AM

## 2021-07-04 DIAGNOSIS — R0602 Shortness of breath: Secondary | ICD-10-CM | POA: Diagnosis not present

## 2021-07-04 LAB — GLUCOSE, CAPILLARY
Glucose-Capillary: 115 mg/dL — ABNORMAL HIGH (ref 70–99)
Glucose-Capillary: 208 mg/dL — ABNORMAL HIGH (ref 70–99)
Glucose-Capillary: 137 mg/dL — ABNORMAL HIGH (ref 70–99)
Glucose-Capillary: 160 mg/dL — ABNORMAL HIGH (ref 70–99)

## 2021-07-04 LAB — CBC
HCT: 24.1 % — ABNORMAL LOW (ref 36.0–46.0)
Hemoglobin: 8.4 g/dL — ABNORMAL LOW (ref 12.0–15.0)
MCH: 28.6 pg (ref 26.0–34.0)
MCHC: 34.9 g/dL (ref 30.0–36.0)
MCV: 82 fL (ref 80.0–100.0)
Platelets: 459 10*3/uL — ABNORMAL HIGH (ref 150–400)
RBC: 2.94 MIL/uL — ABNORMAL LOW (ref 3.87–5.11)
RDW: 17.8 % — ABNORMAL HIGH (ref 11.5–15.5)
WBC: 13.9 10*3/uL — ABNORMAL HIGH (ref 4.0–10.5)
nRBC: 0.1 % (ref 0.0–0.2)

## 2021-07-04 LAB — BASIC METABOLIC PANEL
Anion gap: 14 (ref 5–15)
BUN: 38 mg/dL — ABNORMAL HIGH (ref 8–23)
CO2: 16 mmol/L — ABNORMAL LOW (ref 22–32)
Calcium: 8.3 mg/dL — ABNORMAL LOW (ref 8.9–10.3)
Chloride: 109 mmol/L (ref 98–111)
Creatinine, Ser: 2.81 mg/dL — ABNORMAL HIGH (ref 0.44–1.00)
GFR, Estimated: 17 mL/min — ABNORMAL LOW (ref >60)
Glucose, Bld: 101 mg/dL — ABNORMAL HIGH (ref 70–99)
Potassium: 4.5 mmol/L (ref 3.5–5.1)
Sodium: 139 mmol/L (ref 135–145)

## 2021-07-04 NOTE — Progress Notes (Signed)
PROGRESS NOTE    Alyssa [name]  OZH:[id number] DOB: [birth year] DOA: 06/15/2021 PCP: [name], DO   Chief Complaint  Patient presents with   Generalized Body Aches   Brief Narrative: 80 year old female with hypertension, HLD, COPD, PVD, CKD stage IV, COVID-19 infection in 2021, dementia presents with pain all over x3 days along with a rash.  She was seen in the ED creatinine 3.1, troponin 31 chest x-ray left lower lobe collapse COVID-19 negative CT of the chest abdomen pelvis showed left lower lobe collapse without visible mass or adenopathy and femoral bypass graft with increased fluid collection and bypass anastomosis measuring 4.8 X4.1 previously 2.6 X2.6 Patient was given valacyclovir IV fluids and admitted for further management for dyspnea due to left lung collapse, hypertension urgency, weight loss, borderline elevated troponin, nongap metabolic acidosis, anemia. Patient was managed with Valtrex, pain control supplemental oxygen. Seen by PT OT and skilled nursing facility recommended. Pain medication adjusted for pain control ,IR consulted but unable to do nerve block. 9/12-anesthesia was able to inject ropivacaine at T11 level-with some improvement in her pain 9/13-antibiotic were changed to Zosyn -WBC count downtrending. Mild confusion-??  History of dementia may have MCI per son was not diagnosed dementia previously.  Suspecting delirium, CT head 9/14 no acute finding. Patient had fecal impaction constipation that improved with enema since then her abdomen pain is stable 9/16-started weaning down on her scheduled oxycodone, hemoglobin was low 6.5 g needing 1 unit PRBC transfusion.  Subjective: Seen this morning.  She is alert awake pleasant.  Not in acute distress.  Pain is controlled.   Has had bowel movements.    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  Continue supplemental oxygen, bronchodilators PT OT. PCCM planning to repeat CT chest and outpatient follow-up in a month-if still not reexpanded then may need inspection bronchoscopy.At risk of developing pneumonia.  Acute Shingles/herpes zoster viral involving up to T11 dermatome involving the left flank and left abdomen: Completed Valtrex, rash crusted/healing -off airborne isolation. On oxycodone 10 mg  q8hr  prn oxy IR, Lyrica Cymbalta capsicum/lidocaine  patch- s/p rupivacaine injection at T11 on 9/12.  On a steroid- stop 9/17.  Pain is much better -oxycodone decreased from 10 mg to 7.5 mg every 8 hours -hopefully can cut down to 5 mg tomorrow.  PMT following for pain management and appreciate.    Mild confusion-??  History of dementia may have MCI per son was not diagnosed dementia previously.  Suspecting delirium, CT head 9/14 no acute finding.  UA grossly abnormal but already covered with antibiotics- possible UTI.  Nonfocal on exam  Leukocytosis: Counts much better after switching to Zosyn 9/13-complete antibiotics 9/18.  procalcitonin 0.3, fortunately remains afebrile, suspect in the setting of her atelectasis, at risk of pneumonia.  Monitor. Recent Labs  Lab 06/28/21 1100 06/29/21 0120 06/30/21 0434 07/01/21 0422 07/02/21 0326 07/03/21 0417 07/04/21 0311  WBC  --  22.6* 26.4* 21.1* 15.9* 13.8* 13.9*  PROCALCITON 0.24 0.28 0.39  --   --   --   --     Constipation/fecal impaction: Resolved with enema.  Keep on MiraLAX and stool softener.wean opiates.   Hypertension urgency: BP fairly controlled on amlodipine, Coreg, clonidine.  prn oral hydralazine.  CKD stage IV/metabolic  acidosis-previous creatinine ~2.6-2.9.Overall stable- on gentle IV fluids and oral bicarb.  At risk of  worsening renal failure-creatinine slowly uptrending from 2.3 to 2.8 keep on IV fluid hydration for next 24 hours. she will need outpatient nephrology follow-up soon on discharge Recent Labs  Lab 06/30/21 0434 07/01/21 0422 07/02/21 0326 07/03/21 0417 07/04/21 0311  BUN 46* 45* 42* 40* 38*  CREATININE 2.36* 2.32* 2.63* 2.73* 2.81*    Hyperkalemia  continue on Lokelma 10 g daily.  K stable Recent Labs  Lab 06/30/21 0434 07/01/21 0422 07/02/21 0326 07/03/21 0417 07/04/21 0311  K 5.1 5.1 5.2* 5.0 4.5     PVD- Fem fem bypass  in 2011/CT on admission showing fluid collection from pseudoaneurysm, cannot Dr. 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 she is on Plavix aspirin, statin  History of tobacco abuse-has quit few months ago Moderate protein calorie malnutrition: Augment diet as tolerated-change from renal to regular diet to liberalize diet.  RD following  Deconditioning: Cont PT OT and will need SNF   HLD Cont statin Anemia of chronic kidney disease:Hb down trended 6.5 GM likely from hemodilution from IV fluids, getting 1 unit PRBC monitor H&H  Recent Labs  Lab 07/01/21 0422 07/02/21 0326 07/03/21 0417 07/03/21 1624 07/04/21 0311  HGB 7.5* 7.0* 6.5* 8.6* 8.4*  HCT 22.5* 20.9* 20.1* 25.6* 24.1*   Depression with anxiety-mood stable  Goals of care: DNR, seen by palliative care.  Prognosis does not appear bright in this patient with multiple comorbidities.    Diet Order             Diet regular Room service appropriate? Yes; Fluid consistency: Thin  Diet effective now                   Nutrition Problem: Moderate Malnutrition Etiology: chronic illness (dementia, COPD) Signs/Symptoms: mild fat depletion, moderate fat depletion, mild muscle depletion, moderate muscle depletion Interventions: Ensure Enlive (each supplement  provides 350kcal and 20 grams of protein), MVI, Liberalize Diet Patient's Body mass index is 19.49 kg/m.  DVT prophylaxis: heparin injection 5,000 Units Start: 06/16/21 0930 Code Status:   Code Status: DNR  Family Communication: plan of care discussed with patient, I had updated patient's son at bedside previously.  Status is: Inpatient Remains inpatient appropriate because:Inpatient level of care appropriate due to severity of illness  Dispo:The patient is from: Home            Anticipated d/c is to: SNF.  Insurance has denied skilled nursing facility and expedited appeal in process.             Patient currently is medically stable            Difficult to place patient No  Unresulted Labs (From admission, onward)    None      Medications reviewed:  Scheduled Meds:  oxyCODONE  7.5 mg Oral Q8H   And   acetaminophen  1,000 mg Oral Q8H   amLODipine  10 mg Oral QHS   arformoterol  15 mcg Nebulization BID   And   budesonide (PULMICORT) nebulizer solution  0.25 mg Nebulization BID   And   revefenacin  175 mcg Nebulization Daily   aspirin EC  81 mg Oral Daily   capsaicin   Topical TID   carvedilol  12.5 mg  Oral BID   cloNIDine  0.2 mg Oral QHS   clopidogrel  75 mg Oral Daily   donepezil  5 mg Oral Q2200   DULoxetine  40 mg Oral Daily   feeding supplement  237 mL Oral TID BM   heparin  5,000 Units Subcutaneous Q8H   insulin aspart  0-5 Units Subcutaneous QHS   insulin aspart  0-6 Units Subcutaneous TID WC   lidocaine  2 patch Transdermal Q24H   megestrol  40 mg Oral BID   multivitamin with minerals  1 tablet Oral Daily   pantoprazole  40 mg Oral Daily   polyethylene glycol  17 g Oral BID   predniSONE  5 mg Oral Q breakfast   pregabalin  75 mg Oral Daily   rosuvastatin  10 mg Oral Daily   sodium bicarbonate  650 mg Oral BID   sodium chloride flush  3 mL Intravenous Q12H   sodium zirconium cyclosilicate  10 g Oral Daily   Continuous Infusions:  sodium chloride 75 mL/hr  at 07/04/21 0955   piperacillin-tazobactam (ZOSYN)  IV 2.25 g (07/04/21 0636)   Consultants:see note  Procedures:see note Antimicrobials: Anti-infectives (From admission, onward)    Start     Dose/Rate Route Frequency Ordered Stop   06/30/21 1615  piperacillin-tazobactam (ZOSYN) IVPB 2.25 g        2.25 g 100 mL/hr over 30 Minutes Intravenous Every 8 hours 06/30/21 1518 07/05/21 0559   06/28/21 1230  ampicillin-sulbactam (UNASYN) 1.5 g in sodium chloride 0.9 % 100 mL IVPB  Status:  Discontinued        1.5 g 200 mL/hr over 30 Minutes Intravenous Every 12 hours 06/28/21 1127 06/30/21 1518   06/17/21 0800  valACYclovir (VALTREX) tablet 1,000 mg        1,000 mg Oral Daily 06/16/21 0839 06/22/21 0900   06/16/21 0515  valACYclovir (VALTREX) tablet 1,000 mg        1,000 mg Oral STAT 06/16/21 0502 06/16/21 0543      Culture/Microbiology    Component Value Date/Time   SDES URINE, RANDOM 09/29/2020 2353   SPECREQUEST  09/29/2020 2353    NONE Performed at [facility], [facility] [address], [place], [place] [address]    CULT 20,000 COLONIES/mL ENTEROBACTER AEROGENES (A) 09/29/2020 2353   REPTSTATUS 10/01/2020 FINAL 09/29/2020 2353    Other culture-see note  Objective: Vitals: Today's Vitals   07/04/21 0848 07/04/21 0849 07/04/21 0911 07/04/21 1012  BP:   134/80   Pulse:   78   Resp:   17   Temp:   98.4 F (36.9 C)   TempSrc:   Oral   SpO2: 97% 97% 97%   Weight:      Height:      PainSc:    8     Intake/Output Summary (Last 24 hours) at 07/04/2021 1023 Last data filed at 07/04/2021 0730 Gross per 24 hour  Intake 1000.91 ml  Output --  Net 1000.91 ml    Filed Weights   06/25/21 2139 06/29/21 1017 06/29/21 2141  Weight: 49.9 kg 49.9 kg 49.9 kg   Weight change:   Intake/Output from previous day: 09/16 0701 - 09/17 0700 In: 1322.1 [P.O.:474; I.V.:416.1; Blood:332; IV [id number]:921] Out: -  Intake/Output this shift: Total I/O In: 120 [P.O.:120] Out: -  Filed  Weights   06/25/21 2139 06/29/21 1017 06/29/21 2141  Weight: 49.9 kg 49.9 kg 49.9 kg   Examination: General exam: AAOx pleasant, not in distress, alert, frail  older than stated age, weak appearing. HEENT:Oral mucosa moist, Ear/Nose WNL grossly, dentition normal. Respiratory system: bilaterally air entry present but diminished on the left lower lobe, no use of accessory muscle Cardiovascular system: S1 & S2 +, No JVD,. Gastrointestinal system: Abdomen soft, NT,ND, BS+ Nervous System:Alert, awake, moving extremities and grossly nonfocal Extremities: Mild leg edema, distal peripheral pulses palpable.  Skin: No rashes,no icterus. MSK: Normal muscle bulk,tone, power      Data Reviewed: I have personally reviewed following labs and imaging studies CBC: Recent Labs  Lab 06/30/21 0434 07/01/21 0422 07/02/21 0326 07/03/21 0417 07/03/21 1624 07/04/21 0311  WBC 26.4* 21.1* 15.9* 13.8*  --  13.9*  HGB 8.0* 7.5* 7.0* 6.5* 8.6* 8.4*  HCT 24.2* 22.5* 20.9* 20.1* 25.6* 24.1*  MCV 85.2 84.9 84.6 85.9  --  82.0  PLT 436* 458* 487* 463*  --  459*    Basic Metabolic Panel: Recent Labs  Lab 06/30/21 0434 07/01/21 0422 07/02/21 0326 07/03/21 0417 07/04/21 0311  NA 142 140 140 140 139  K 5.1 5.1 5.2* 5.0 4.5  CL 113* 105 109 107 109  CO2 21* 20* 20* 19* 16*  GLUCOSE 124* 84 99 92 101*  BUN 46* 45* 42* 40* 38*  CREATININE 2.36* 2.32* 2.63* 2.73* 2.81*  CALCIUM 9.1 9.0 8.7* 8.6* 8.3*    GFR: Estimated Creatinine Clearance: 13.2 mL/min (A) (by C-G formula based on SCr of 2.81 mg/dL (H)). Liver Function Tests: No results for input(s): AST, ALT, ALKPHOS, BILITOT, PROT, ALBUMIN in the last 168 hours.  No results for input(s): LIPASE, AMYLASE in the last 168 hours. No results for input(s): AMMONIA in the last 168 hours. Coagulation Profile: No results for input(s): INR, PROTIME in the last 168 hours. Cardiac Enzymes: No results for input(s): CKTOTAL, CKMB, CKMBINDEX, TROPONINI in the  last 168 hours. BNP (last 3 results) No results for input(s): PROBNP in the last 8760 hours. HbA1C: No results for input(s): HGBA1C in the last 72 hours.  CBG: Recent Labs  Lab 07/03/21 0722 07/03/21 1157 07/03/21 1703 07/03/21 2105 07/04/21 0725  GLUCAP 89 96 231* 123* 115*    Lipid Profile: No results for input(s): CHOL, HDL, LDLCALC, TRIG, CHOLHDL, LDLDIRECT in the last 72 hours. Thyroid Function Tests: No results for input(s): TSH, T4TOTAL, FREET4, T3FREE, THYROIDAB in the last 72 hours. Anemia Panel: No results for input(s): VITAMINB12, FOLATE, FERRITIN, TIBC, IRON, RETICCTPCT in the last 72 hours. Sepsis Labs: Recent Labs  Lab 06/28/21 1100 06/29/21 0120 06/30/21 0434  PROCALCITON 0.24 0.28 0.39     No results found for this or any previous visit (from the past 240 hour(s)).    Radiology Studies: DG Abd 1 View  Result Date: 07/02/2021 CLINICAL DATA:  Constipation EXAM: ABDOMEN - 1 VIEW COMPARISON:  12/10/2018 FINDINGS: Nonobstructed bowel-gas pattern with mild to moderate stool. Radiopaque material in the rectosigmoid colon. Hardware in the lumbar spine and right hip. Chronic osseous deformity of the right hip. Probable calcified uterine fibroids IMPRESSION: Nonobstructed gas pattern with mild to moderate stool in the colon Electronically Signed   By: Donavan Foil M.D.   On: 07/02/2021 17:19     LOS: 15 days   Antonieta Pert, MD [facility]  07/04/2021, 10:23 AM

## 2021-07-05 DIAGNOSIS — R0602 Shortness of breath: Secondary | ICD-10-CM | POA: Diagnosis not present

## 2021-07-05 LAB — BASIC METABOLIC PANEL
Anion gap: 11 (ref 5–15)
BUN: 35 mg/dL — ABNORMAL HIGH (ref 8–23)
CO2: 18 mmol/L — ABNORMAL LOW (ref 22–32)
Calcium: 8.6 mg/dL — ABNORMAL LOW (ref 8.9–10.3)
Chloride: 113 mmol/L — ABNORMAL HIGH (ref 98–111)
Creatinine, Ser: 2.53 mg/dL — ABNORMAL HIGH (ref 0.44–1.00)
GFR, Estimated: 19 mL/min — ABNORMAL LOW (ref >60)
Glucose, Bld: 104 mg/dL — ABNORMAL HIGH (ref 70–99)
Potassium: 3.7 mmol/L (ref 3.5–5.1)
Sodium: 142 mmol/L (ref 135–145)

## 2021-07-05 LAB — CBC
HCT: 24.8 % — ABNORMAL LOW (ref 36.0–46.0)
Hemoglobin: 8.3 g/dL — ABNORMAL LOW (ref 12.0–15.0)
MCH: 28 pg (ref 26.0–34.0)
MCHC: 33.5 g/dL (ref 30.0–36.0)
MCV: 83.8 fL (ref 80.0–100.0)
Platelets: 490 10*3/uL — ABNORMAL HIGH (ref 150–400)
RBC: 2.96 MIL/uL — ABNORMAL LOW (ref 3.87–5.11)
RDW: 18 % — ABNORMAL HIGH (ref 11.5–15.5)
WBC: 11.7 10*3/uL — ABNORMAL HIGH (ref 4.0–10.5)
nRBC: 0.2 % (ref 0.0–0.2)

## 2021-07-05 LAB — GLUCOSE, CAPILLARY
Glucose-Capillary: 87 mg/dL (ref 70–99)
Glucose-Capillary: 172 mg/dL — ABNORMAL HIGH (ref 70–99)
Glucose-Capillary: 153 mg/dL — ABNORMAL HIGH (ref 70–99)

## 2021-07-05 NOTE — Progress Notes (Signed)
Alyssa [name]  YKD:[id number] DOB: [birth year] DOA: 06/15/2021 PCP: [name], DO    Brief Narrative:  80 year old female with hypertension, HLD, COPD, PVD, CKD stage IV, and dementia presents with pain all over x3 days along with a rash.  She was seen in the ED creatinine 3.1, troponin 31 chest x-ray left lower lobe collapse COVID-19 negative CT of the chest abdomen pelvis showed left lower lobe collapse without visible mass or adenopathy and femoral bypass graft with increased fluid collection and bypass anastomosis measuring 4.8 X4.1 previously 2.6 X2.6 Patient was given valacyclovir IV fluids and admitted for further management for dyspnea due to left lung collapse, hypertension urgency, weight loss, borderline elevated troponin, nongap metabolic acidosis, anemia.  Significant Events:  Patient was managed with Valtrex, pain control supplemental oxygen. Seen by PT OT and skilled nursing facility recommended. Pain medication adjusted for pain control ,IR consulted but unable to do nerve block. 9/12-anesthesia was able to inject ropivacaine at T11 level-with some improvement in her pain 9/13-antibiotic were changed to Zosyn -WBC count downtrending. Mild confusion-??  History of dementia may have MCI per son was not diagnosed dementia previously.  Suspecting delirium, CT head 9/14 no acute finding. Patient had fecal impaction that improved with enema since then her abdomen pain is stable 9/16-started weaning down on her scheduled oxycodone, hemoglobin was low 6.5 g needing 1 unit PRBC transfusion.   Consultants:  Pulmonary  Code Status: NO CODE BLUE  Antimicrobials:  Unasyn 9/11 > 9/13 Zosyn 9/13 > 9/17  DVT prophylaxis: Subcu heparin  Subjective: Resting comfortably in bed.  Denies new complaints today.  Denies shortness of breath chest pain or abdominal pain.  Reports good appetite and that she is moving her bowels.  Assessment & Plan:  Left lower lobe lung  collapse/chest splinting -acute hypoxic respiratory failure Severe atelectasis due to severe splinting related to shingles -repeat CT chest outpatient to assure lung has reexpanded -saturations high 90s today  Chronic COPD Well compensated at present without wheezing  Acute shingles/herpes zoster of T11 dermatome (L flank and abdomen) Has completed a course of Valtrex -rash healing with no further need for isolation -continue pain management -completed a course of steroid 9/17 -status post bupivacaine injection at T11 9/12 - weaning narcotic down consistently  Mild confusion May have mild cognitive impairment at baseline per son but no formal diagnosis of dementia -likely simply acute delirium related to above  Leukocytosis Likely due to steroid -has completed a 7-day course of broad-spectrum antibiotic  Constipation/fecal impaction Due to use of narcotics -resolved with enema -continue bowel regimen  HTN Controlled at present  CKD stage IV Baseline creatinine approximately 2.7 -creatinine stable at this range presently  Recent Labs  Lab 07/01/21 0422 07/02/21 0326 07/03/21 0417 07/04/21 0311 07/05/21 0521  CREATININE 2.32* 2.63* 2.73* 2.81* 2.53*     Hyperkalemia Resolved  Peripheral vascular disease status post femorofemoral bypass 2011 Large right femoral anastomotic pseudoaneurysm appreciated on CT this admission -will need elective outpatient repair in the future -to follow-up as outpatient  Moderate protein calorie malnutrition  Deconditioning  Anemia of chronic kidney disease Following hemoglobin this admission felt to be due to dilution    Family Communication: No family present at time of exam Status is: Inpatient  Remains inpatient appropriate because:Inpatient level of care appropriate due to severity of illness  Dispo: The patient is from: Home              Anticipated d/c is to: SNF  Patient currently is not medically stable to d/c.    Difficult to place patient No    Objective: Blood pressure (!) 156/95, pulse 77, temperature 98.2 F (36.8 C), temperature source Oral, resp. rate 17, height 5\' 3"  (1.6 m), weight 49.9 kg, SpO2 97 %.  Intake/Output Summary (Last 24 hours) at 07/05/2021 0918 Last data filed at 07/05/2021 0745 Gross per 24 hour  Intake 2930.1 ml  Output 0 ml  Net 2930.1 ml   Filed Weights   06/25/21 2139 06/29/21 1017 06/29/21 2141  Weight: 49.9 kg 49.9 kg 49.9 kg    Examination: General: No acute respiratory distress Lungs: Clear to auscultation bilaterally without wheezes or crackles Cardiovascular: Regular rate and rhythm without murmur gallop or rub normal S1 and S2 Abdomen: Nontender, nondistended, soft, bowel sounds positive, no rebound, no ascites, no appreciable mass Extremities: No significant cyanosis, clubbing, or edema bilateral lower extremities  CBC: Recent Labs  Lab 07/03/21 0417 07/03/21 1624 07/04/21 0311 07/05/21 0521  WBC 13.8*  --  13.9* 11.7*  HGB 6.5* 8.6* 8.4* 8.3*  HCT 20.1* 25.6* 24.1* 24.8*  MCV 85.9  --  82.0 83.8  PLT 463*  --  459* 387*   Basic Metabolic Panel: Recent Labs  Lab 07/03/21 0417 07/04/21 0311 07/05/21 0521  NA 140 139 142  K 5.0 4.5 3.7  CL 107 109 113*  CO2 19* 16* 18*  GLUCOSE 92 101* 104*  BUN 40* 38* 35*  CREATININE 2.73* 2.81* 2.53*  CALCIUM 8.6* 8.3* 8.6*   GFR: Estimated Creatinine Clearance: 14.7 mL/min (A) (by C-G formula based on SCr of 2.53 mg/dL (H)).   HbA1C: Hgb A1c MFr Bld  Date/Time Value Ref Range Status  06/21/2021 04:16 PM 6.7 (H) 4.8 - 5.6 % Final    Comment:    REPEATED TO VERIFY (NOTE) Pre diabetes:          5.7%-6.4%  Diabetes:              >6.4%  Glycemic control for   <7.0% adults with diabetes   06/07/2020 06:51 AM 6.2 (H) 4.8 - 5.6 % Final    Comment:    (NOTE) Pre diabetes:          5.7%-6.4%  Diabetes:              >6.4%  Glycemic control for   <7.0% adults with diabetes      CBG: Recent Labs  Lab 07/04/21 0725 07/04/21 1137 07/04/21 1631 07/04/21 2039 07/05/21 0705  GLUCAP 115* 208* 137* 160* 87     Scheduled Meds:  oxyCODONE  7.5 mg Oral Q8H   And   acetaminophen  1,000 mg Oral Q8H   amLODipine  10 mg Oral QHS   arformoterol  15 mcg Nebulization BID   And   budesonide (PULMICORT) nebulizer solution  0.25 mg Nebulization BID   And   revefenacin  175 mcg Nebulization Daily   aspirin EC  81 mg Oral Daily   capsaicin   Topical TID   carvedilol  12.5 mg Oral BID   cloNIDine  0.2 mg Oral QHS   clopidogrel  75 mg Oral Daily   donepezil  5 mg Oral Q2200   DULoxetine  40 mg Oral Daily   feeding supplement  237 mL Oral TID BM   heparin  5,000 Units Subcutaneous Q8H   insulin aspart  0-5 Units Subcutaneous QHS   insulin aspart  0-6 Units Subcutaneous TID WC   lidocaine  2  patch Transdermal Q24H   megestrol  40 mg Oral BID   multivitamin with minerals  1 tablet Oral Daily   pantoprazole  40 mg Oral Daily   polyethylene glycol  17 g Oral BID   pregabalin  75 mg Oral Daily   rosuvastatin  10 mg Oral Daily   sodium bicarbonate  650 mg Oral BID   sodium chloride flush  3 mL Intravenous Q12H   sodium zirconium cyclosilicate  10 g Oral Daily      LOS: 16 days   Cherene Altes, MD [facility] Office  [phone] Pager - Text Page per Shea Evans  If 7PM-7AM, please contact night-coverage per Amion 07/05/2021, 9:18 AM

## 2021-07-06 ENCOUNTER — Encounter (HOSPITAL_COMMUNITY): Payer: Self-pay | Admitting: Internal Medicine

## 2021-07-06 DIAGNOSIS — K6289 Other specified diseases of anus and rectum: Secondary | ICD-10-CM | POA: Diagnosis not present

## 2021-07-06 DIAGNOSIS — K625 Hemorrhage of anus and rectum: Secondary | ICD-10-CM

## 2021-07-06 DIAGNOSIS — R0602 Shortness of breath: Secondary | ICD-10-CM | POA: Diagnosis not present

## 2021-07-06 LAB — HEMOGLOBIN AND HEMATOCRIT, BLOOD
HCT: 24 % — ABNORMAL LOW (ref 36.0–46.0)
HCT: 23.5 % — ABNORMAL LOW (ref 36.0–46.0)
Hemoglobin: 8.1 g/dL — ABNORMAL LOW (ref 12.0–15.0)
Hemoglobin: 7.9 g/dL — ABNORMAL LOW (ref 12.0–15.0)

## 2021-07-06 LAB — CBC
HCT: 23.8 % — ABNORMAL LOW (ref 36.0–46.0)
Hemoglobin: 8.1 g/dL — ABNORMAL LOW (ref 12.0–15.0)
MCH: 28.4 pg (ref 26.0–34.0)
MCHC: 34 g/dL (ref 30.0–36.0)
MCV: 83.5 fL (ref 80.0–100.0)
Platelets: 471 10*3/uL — ABNORMAL HIGH (ref 150–400)
RBC: 2.85 MIL/uL — ABNORMAL LOW (ref 3.87–5.11)
RDW: 18.3 % — ABNORMAL HIGH (ref 11.5–15.5)
WBC: 10.4 10*3/uL (ref 4.0–10.5)
nRBC: 0.4 % — ABNORMAL HIGH (ref 0.0–0.2)

## 2021-07-06 LAB — GLUCOSE, CAPILLARY
Glucose-Capillary: 102 mg/dL — ABNORMAL HIGH (ref 70–99)
Glucose-Capillary: 96 mg/dL (ref 70–99)
Glucose-Capillary: 158 mg/dL — ABNORMAL HIGH (ref 70–99)
Glucose-Capillary: 83 mg/dL (ref 70–99)

## 2021-07-06 LAB — PREPARE RBC (CROSSMATCH)

## 2021-07-06 MED ORDER — SODIUM CHLORIDE 0.9% IV SOLUTION: Freq: Once | INTRAVENOUS | Status: DC

## 2021-07-06 MED ORDER — OXYCODONE HCL 5 MG PO TABS
5.0000 mg | ORAL_TABLET | Freq: Three times a day (TID) | ORAL | Status: DC
  Administered 2021-07-06 – 2021-07-10 (×12): 5 mg via ORAL
  Filled 2021-07-06 (×12): qty 1

## 2021-07-06 MED ORDER — LIDOCAINE 4 % EX CREA
TOPICAL_CREAM | Freq: Four times a day (QID) | CUTANEOUS | Status: DC | PRN
  Administered 2021-07-07: 1 via TOPICAL
  Filled 2021-07-06: qty 5

## 2021-07-06 MED ORDER — SODIUM ZIRCONIUM CYCLOSILICATE 5 G PO PACK
5.0000 g | PACK | Freq: Every day | ORAL | Status: DC
  Administered 2021-07-07 – 2021-07-10 (×4): 5 g via ORAL
  Filled 2021-07-06 (×4): qty 1

## 2021-07-06 MED ORDER — HYDROCORTISONE ACETATE 25 MG RE SUPP
25.0000 mg | Freq: Two times a day (BID) | RECTAL | Status: DC
  Administered 2021-07-06 – 2021-07-10 (×9): 25 mg via RECTAL
  Filled 2021-07-06 (×10): qty 1

## 2021-07-06 MED ORDER — ACETAMINOPHEN 500 MG PO TABS
1000.0000 mg | ORAL_TABLET | Freq: Three times a day (TID) | ORAL | Status: DC
  Administered 2021-07-06 – 2021-07-10 (×12): 1000 mg via ORAL
  Filled 2021-07-06 (×12): qty 2

## 2021-07-06 MED ORDER — LIDOCAINE HCL URETHRAL/MUCOSAL 2 % EX GEL
1.0000 "application " | Freq: Four times a day (QID) | CUTANEOUS | Status: DC | PRN
  Filled 2021-07-06: qty 6

## 2021-07-06 NOTE — TOC Progression Note (Addendum)
Transition of Care [facility]) - Progression Note    Patient Details  Name: Alyssa [name] MRN: [id number] Date of Birth: 12-26-1940  Transition of [facility]) CM/SW Contact  [name], LCSW Phone Number: 07/06/2021, 11:17 AM  Clinical Narrative:  CSW was contacted by Juliann Pulse, admissions liaison with [facility] that [facility] sent them a form that needed to be completed. An "Appointment of Representative" form was emailed to [place]. It was completed, signed by patient and CSW and faxed back to [place] [phone]. This form is concerning representing patient during the [facility] appeal process.  3:11 pm: CSW received call from [place] with [facility] grievance [phone]). CSW informed that the appeal paperwork was received and there is a resolution date of 9/22.  Gae Bon added that someone with [facility] will reach out to this CSW. Confirmed with Gae Bon that the skilled facility is [facility] and that patient is still in the hospital.  CSW will continue to follow and provide continued SW interventions services as needed.     Expected Discharge Plan: [place] Barriers to Discharge: Continued Medical Work up  Expected Discharge Plan and Services Expected Discharge Plan: [place] In-house Referral: Clinical Social Work     Living arrangements for the past 2 months: Single Family Home                                       Social Determinants of Health (SDOH) Interventions    Readmission Risk Interventions Readmission Risk Prevention Plan 07/10/2020 12/26/2018  Transportation Screening Complete Complete  Medication Review Press photographer) Complete Complete  PCP or Specialist appointment within 3-5 days of discharge - Not Complete  PCP/Specialist Appt Not Complete comments - pt for snf. Snf MD to see as needed.  [place] or Home Care Consult Complete Not Complete  HRI or Home Care Consult Pt Refusal Comments -  NA  SW Recovery Care/Counseling Consult Complete Not Complete  SW Consult Not Complete Comments - NA  Palliative Care Screening Not Applicable Complete  Skilled Nursing Facility Not Applicable Complete  Some recent data might be hidden

## 2021-07-06 NOTE — Significant Event (Addendum)
Patient was noticed by patient's nurse to be having active oozing of blood from rectum.  Patient is hemodynamically stable.  Last hemoglobin on September 17 was 8.3.  I have ordered a stat CBC and also ordering 1 unit of PRBC we will consult [facility].  I have sent message for Dr. Candis Schatz gastroenterologist.  I am holding off patient's heparin for DVT prophylaxis and aspirin.  We will keep patient on SCDs.  Gastroenterologist Dr. Candis Schatz advised packing with gauze around the perirectal area.  We will continue to monitor and will be seeing patient in consult.  Gean Birchwood

## 2021-07-06 NOTE — Consult Note (Addendum)
[facility] Consult: 10:16 AM 07/06/2021  LOS: 17 days    Referring Provider: Dr Antonieta Pert  Primary Care Physician:  [name], DO Primary Gastroenterologist:  Dr. Lucio Edward    Reason for Consultation:  hematochezia.     HPI: Alyssa [name] is a 80 y.o. female.  PMH COPD.  CKD 4.  Peripheral vascular disease.  Hypertension.  Peripheral vascular disease.  Status post femoral bypass graft.  Cerebral microvascular ischemia.  S/p spine surgery.  Chronic Plavix. 01/2018 EGD.  For IDA: Grade C esophagitis.  Distal esophageal changes suspicious for short segment Barrett's, biopsy/pathology confirmed Barrett's.  Medium hiatal hernia.  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  For IDA.  Dr. Lucio Edward.  Multiple large and small diverticula at a sending and sigmoid colon.  In the sigmoid there was diverticular associated narrowing.  5 mm sessile polyp removed but not retrieved from transverse colon. 05/2019 flexible sigmoidoscopy.  For evaluation diarrhea.  Severe sigmoid diverticulosis with associated narrowing and severe spasm.  Evidence for impacted diverticulum, no diverticular bleeding.  06/16/2021.  Noncontrast CTAP for abd pain: showed sigmoid diverticulosis.  Increased fluid collection or pseudoaneurysm in right groin bypass anastomosis.  Moderate hiatal hernia.  Aortic atherosclerosis.  Atelectasis of left lower lobe.  Day 21 of admission.  Presented with global pain and rash attributed to acute shingles T 11 dermatone.  Also experiencing acute hypoxic respiratory failure from severe atelectasis due to respiratory splinting associated with shingles pain.  Fecal impaction addressed w laxatives, enemas.  Saw some minor bleeding over the past  several days.  After impaction addressed, was having soft or liquid stools for the most part.  Overnight patient had single episode of large-volume hematochezia, not associated with hemodynamic instability.  No nausea or vomiting.  Patient complains of left abdominal pain but this is related to the zoster infection.  Has a large hematoma in the right lower quadrant migrating into the flank but this is not the area of pain.  Appetite depressed.  Recent heart rate and blood pressure not tachycardic, not hypotensive.  Room air saturations high 90s to 100%.  No fevers.  Has been receiving subcu, DVT prophylactic, heparin.  Last dose of heparin was yesterday morning, now discontinued.   Hgb dropped to 6.5 3 days ago., received 1 PRBC. Hgb 7.6 at admission, nadir 6.5,  s/p  1PRBCs, currently 8.3.  MCV in the mid 80s.  Platelets normal.  INR 1.2. Persistent CKD/AKI with GFR 17-21.  Note that the BUN has improved from 45 >> 35 over the past several days despite onset of GI bleeding. No CT abdomen/pelvis or other GI imaging since admission.  Up until admission was smoking 1 pack of cigarettes daily.  Does not drink alcohol.  Lives in her own home alone but family visits frequently and helps her with cooking, housekeeping. Patient not aware of any gastrointestinal diseases, cancers in her close family     Past Medical History:  Diagnosis Date   Acute respiratory failure with hypoxia ([place])  11/01/2019   Anxiety    Arthritis    Chest pain    Chronic kidney disease    Dyspnea    Elevated troponin 12/13/2018   Headache(784.0)    Hyperlipidemia    Hypertension    Joint pain    Leg pain    Peripheral neuropathy    Peripheral vascular disease ([place])    Syncope 09/2020    Past Surgical History:  Procedure Laterality Date   ABDOMINAL ANGIOGRAM N/A 10/29/2011   Procedure: ABDOMINAL ANGIOGRAM;  Surgeon: Elam Dutch, MD;  Location: [facility] CATH LAB;  Service: Cardiovascular;  Laterality: N/A;   BACK  SURGERY     FEMORAL-FEMORAL BYPASS GRAFT  08/31/2010   FLEXIBLE SIGMOIDOSCOPY N/A 06/12/2019   Procedure: FLEXIBLE SIGMOIDOSCOPY;  Surgeon: Ladene Artist, MD;  Location: [facility];  Service: Endoscopy;  Laterality: N/A;  Patient had little bit to eat this morning so this will be unsedated.   JOINT REPLACEMENT Right 2012   Hip   LOWER EXTREMITY ANGIOGRAPHY  06/14/2017   Procedure: Lower Extremity Angiography;  Surgeon: Adrian Prows, MD;  Location: [facility] CV LAB;  Service: Cardiovascular;;  Bilateral limited angio performed   RENAL ANGIOGRAPHY N/A 06/14/2017   Procedure: Renal Angiography;  Surgeon: Adrian Prows, MD;  Location: [facility];  Service: Cardiovascular;  Laterality: N/A;   TOTAL HIP ARTHROPLASTY     right    Prior to Admission medications   Medication Sig Start Date End Date Taking? Authorizing Provider  acetaminophen (TYLENOL) 325 MG tablet Take 2 tablets (650 mg total) by mouth every 6 (six) hours as needed for mild pain, moderate pain or fever (or Fever >/= 101). 06/16/19   Hongalgi, Lenis Dickinson, MD  albuterol (VENTOLIN HFA) 108 (90 Base) MCG/ACT inhaler Inhale 2 puffs into the lungs every 4 (four) hours as needed for wheezing or shortness of breath.     [provider]  amLODipine (NORVASC) 5 MG tablet Take 7.5 mg by mouth at bedtime.    [provider]  aspirin 81 MG EC tablet Take 1 tablet (81 mg total) by mouth daily. 01/09/19   Caren Griffins, MD  Budeson-Glycopyrrol-Formoterol (BREZTRI AEROSPHERE) 160-9-4.8 MCG/ACT AERO Inhale 2 puffs into the lungs 2 (two) times daily. 09/25/20   Tanda Rockers, MD  carvedilol (COREG) 12.5 MG tablet Take 12.5 mg by mouth 2 (two) times daily. 09/05/20   [provider]  cloNIDine (CATAPRES) 0.2 MG tablet Take 0.2 mg by mouth at bedtime. 05/25/20   [provider]  clopidogrel (PLAVIX) 75 MG tablet Take 1 tablet (75 mg total) by mouth daily. 01/09/19   Caren Griffins, MD  diclofenac Sodium  (VOLTAREN) 1 % GEL Apply 2 g topically 4 (four) times daily as needed (pain).  01/15/20   [provider]  dicyclomine (BENTYL) 20 MG tablet Take 20 mg by mouth 3 (three) times daily. 04/01/20   [provider]  donepezil (ARICEPT) 5 MG tablet Take 1 tablet (5 mg total) by mouth daily. 11/20/20   Cameron Sprang, MD  DULoxetine (CYMBALTA) 30 MG capsule Take 30 mg by mouth daily.    [provider]  feeding supplement, ENSURE ENLIVE, (ENSURE ENLIVE) LIQD Take 237 mLs by mouth 2 (two) times daily between meals. 06/16/19   Hongalgi, Lenis Dickinson, MD  Ferrous Sulfate (IRON HIGH-POTENCY) 325 MG TABS Take 325 mg by mouth daily with breakfast. 06/30/19   Hongalgi, Lenis Dickinson, MD  gabapentin (NEURONTIN) 100 MG capsule Take 1  tablet in morning, 1 tablet at noon, 2 tablets at night 11/20/20   Cameron Sprang, MD  hydrOXYzine (ATARAX/VISTARIL) 25 MG tablet Take 25 mg by mouth at bedtime as needed (sleep).  05/12/20   [provider]  magnesium oxide (MAG-OX) 400 MG tablet Take 400 mg by mouth 2 (two) times daily.    [provider]  megestrol (MEGACE) 40 MG tablet Take 40 mg by mouth 2 (two) times daily. 05/12/20   [provider]  methocarbamol (ROBAXIN) 500 MG tablet Take 500 mg by mouth 3 (three) times daily as needed (back spasms).  05/12/20   [provider]  Multiple Vitamin (MULTIVITAMIN WITH MINERALS) TABS tablet Take 1 tablet by mouth daily. 06/17/19   Hongalgi, Lenis Dickinson, MD  nitroGLYCERIN (NITROSTAT) 0.4 MG SL tablet Place 1 tablet (0.4 mg total) under the tongue every 5 (five) minutes as needed for chest pain. 01/07/20   Allie Bossier, MD  ondansetron (ZOFRAN ODT) 4 MG disintegrating tablet Take 1 tablet (4 mg total) by mouth every 4 (four) hours as needed for nausea or vomiting. 06/09/20   Charlesetta Shanks, MD  pantoprazole (PROTONIX) 40 MG tablet Take 1 tablet (40 mg total) by mouth daily. 06/16/19   Hongalgi, Lenis Dickinson, MD  rosuvastatin (CRESTOR) 40 MG tablet  Take 1 tablet (40 mg total) by mouth daily. 10/03/20   Jennye Boroughs, MD  sodium bicarbonate 650 MG tablet Take 2 tablets (1,300 mg total) by mouth 2 (two) times daily. Patient taking differently: Take 650 mg by mouth daily. 01/07/20   Allie Bossier, MD  Vitamin D, Ergocalciferol, (DRISDOL) 1.25 MG (50000 UNIT) CAPS capsule Take 50,000 Units by mouth every Monday.    [provider]    Scheduled Meds:  sodium chloride   Intravenous Once   oxyCODONE  5 mg Oral Q8H   And   acetaminophen  1,000 mg Oral Q8H   amLODipine  10 mg Oral QHS   arformoterol  15 mcg Nebulization BID   And   budesonide (PULMICORT) nebulizer solution  0.25 mg Nebulization BID   And   revefenacin  175 mcg Nebulization Daily   capsaicin   Topical TID   carvedilol  12.5 mg Oral BID   cloNIDine  0.2 mg Oral QHS   donepezil  5 mg Oral Q2200   DULoxetine  40 mg Oral Daily   feeding supplement  237 mL Oral TID BM   insulin aspart  0-5 Units Subcutaneous QHS   insulin aspart  0-6 Units Subcutaneous TID WC   lidocaine  2 patch Transdermal Q24H   megestrol  40 mg Oral BID   multivitamin with minerals  1 tablet Oral Daily   pantoprazole  40 mg Oral Daily   polyethylene glycol  17 g Oral BID   pregabalin  75 mg Oral Daily   rosuvastatin  10 mg Oral Daily   sodium bicarbonate  650 mg Oral BID   sodium chloride flush  3 mL Intravenous Q12H   [START ON 07/07/2021] sodium zirconium cyclosilicate  5 g Oral Daily   Infusions:  PRN Meds: albuterol, bisacodyl, hydrALAZINE, ondansetron **OR** ondansetron (ZOFRAN) IV, oxyCODONE, oxyCODONE   Allergies as of 06/15/2021   (No Known Allergies)    Family History  Problem Relation Age of Onset   Heart attack Mother    Heart disease Mother    Hyperlipidemia Mother    Hypertension Mother    Heart attack Father    Heart disease Father  Before age 47   Hyperlipidemia Father    Hypertension Father    Cancer Sister        brain tumor   Aneurysm Brother         brain   Colon cancer Neg Hx    Liver cancer Neg Hx     Social History   Socioeconomic History   Marital status: Single    Spouse name: Not on file   Number of children: 2   Years of education: 11th   Highest education level: Not on file  Occupational History   Occupation: Retired  Tobacco Use   Smoking status: Every Day    Packs/day: 1.00    Years: 50.00    Pack years: 50.00    Types: Cigarettes   Smokeless tobacco: Never   Tobacco comments:    1/4 ppd 12/05/20//lmr  Vaping Use   Vaping Use: Never used  Substance and Sexual Activity   Alcohol use: No   Drug use: No   Sexual activity: Not Currently  Other Topics Concern   Not on file  Social History Narrative    Lives at home with her brother.   Right-handed.   Occasional caffeine use.      REVIEW OF SYSTEMS: Constitutional: Feels tired, weak. ENT:  No nose bleeds Pulm: Not feeling short of breath. CV:  No palpitations.  No chest pain.  Chronic lower extremity swelling in the feet bilaterally GU:  No hematuria, no frequency GI: See HPI. Heme: The only unusual bleeding or bruising she has had is since admission and she has had the bruising on her lower abdomen as well as this recent bleeding per rectum. Transfusions: No prior transfusions, has received 1 unit PRBCs in the last few days. Neuro:  No headaches, no peripheral tingling or numbness.  No syncope, no seizures. Derm:  No itching, no rash or sores.  Endocrine:  No sweats or chills.  No polyuria or dysuria Immunization: Have not been vaccinated against shingles.  Has received COVID-19 vaccines x3. Travel:  None beyond local counties in last few months.    PHYSICAL EXAM: Vital signs in last 24 hours: Vitals:   07/06/21 0825 07/06/21 0942  BP:  (!) 147/76  Pulse:  83  Resp:  18  Temp:  98.6 F (37 C)  SpO2: 96% 100%   Wt Readings from Last 3 Encounters:  06/29/21 49.9 kg  01/02/21 49 kg  12/05/20 49.4 kg    General: Pleasant, frail,  comfortable.  Looks acutely and chronically ill. Head: No facial asymmetry or swelling.  No signs of head trauma. Eyes: No scleral icterus.  Conjunctiva slightly pale.  EOMI Ears: Not hard of hearing Nose: No congestion or discharge Mouth: Three quarters of her teeth are absent.  No teeth on the upper jaw, lower front teeth intact and in fair condition.  No appliances in place. Neck: No JVD, no masses, no thyromegaly Lungs: No labored breathing, no cough.  Lungs clear bilaterally Heart: RRR.  No MRG.  S1, S2 present. Abdomen: Healing zoster rash involving left lower quadrant moving across the midline into right lower quadrant.  No HSM, masses, bruits.  Patient is tender on the left side, epigastric area but no guarding or rebound.  Extensive bruising involving the bulk of right lower quadrant and radiating around into the region below right flank..   Rectal: Deferred Musc/Skeltl: No joint redness, swelling or gross deformity. Extremities: 2+ pedal edema.  Feet are warm. Neurologic: Alert.  Appropriate.  Oriented x3.  No tremor or gross deficits.  Did not test limb strength. Skin: Resolving rash from herpes, large right lower quadrant bruise as per abdominal exam.  Nodes: No cervical or inguinal adenopathy. Psych: Calm, cooperative, fluid speech.  Intake/Output from previous day: 09/18 0701 - 09/19 0700 In: 540 [P.O.:540] Out: 0  Intake/Output this shift: No intake/output data recorded.  LAB RESULTS: Recent Labs    07/04/21 0311 07/05/21 0521 07/06/21 0202  WBC 13.9* 11.7* 10.4  HGB 8.4* 8.3* 8.1*  HCT 24.1* 24.8* 23.8*  PLT 459* 490* 471*   BMET Lab Results  Component Value Date   NA 142 07/05/2021   NA 139 07/04/2021   NA 140 07/03/2021   K 3.7 07/05/2021   K 4.5 07/04/2021   K 5.0 07/03/2021   CL 113 (H) 07/05/2021   CL 109 07/04/2021   CL 107 07/03/2021   CO2 18 (L) 07/05/2021   CO2 16 (L) 07/04/2021   CO2 19 (L) 07/03/2021   GLUCOSE 104 (H) 07/05/2021   GLUCOSE  101 (H) 07/04/2021   GLUCOSE 92 07/03/2021   BUN 35 (H) 07/05/2021   BUN 38 (H) 07/04/2021   BUN 40 (H) 07/03/2021   CREATININE 2.53 (H) 07/05/2021   CREATININE 2.81 (H) 07/04/2021   CREATININE 2.73 (H) 07/03/2021   CALCIUM 8.6 (L) 07/05/2021   CALCIUM 8.3 (L) 07/04/2021   CALCIUM 8.6 (L) 07/03/2021   LFT No results for input(s): PROT, ALBUMIN, AST, ALT, ALKPHOS, BILITOT, BILIDIR, IBILI in the last 72 hours. PT/INR Lab Results  Component Value Date   INR 1.2 07/06/2020   INR 2.2 (H) 07/06/2020   INR 1.0 11/01/2019    Lipase     Component Value Date/Time   LIPASE 21 06/15/2021 1740     IMPRESSION:      Bleeding per rectum.  Initially minor bleeding associated with fecal impaction over several days.  Within the last 24 hours/overnight developed solitary episode large-volume hematochezia.  Rule out diverticular source, rule out stercoral ulcer/stercoral colitis (staff addressing fecal impaction last week)     Chronic Plavix (PVD).  Now discontinued/on hold.  Last dose was yesterday.  Also had been receiving DVT prophylaxis SQ heparin, also discontinued, last injection was 2330 on 9/18, last night.     Anemia.  Acute on chronic inpatient with baseline stage III CKD.  Hgb nadir 6.5, improved after 1 PRBC.  I suspect the bruising on her right lower abdomen is contributing to the anemia along with overnight acute hematochezia.     Acute herpes zoster, dermatome T11.  Treated with steroids, valacyclovir, empiric antibiotic.    PLAN:     Patient is frail, not clear that colonoscopy would be in her best interest.  Pursue CT abdomen pelvis which would be a noncontrast study?   Continue bowel regimen with MiraLAX bid.  Dr Carlean Purl to follow.     [name]  07/06/2021, 10:16 AM Phone Glide Attending   I have taken an interval history, reviewed the chart and examined the patient. I agree with the Advanced Practitioner's note, impression and  recommendations.  Majority the medical decision-making in the formulation of the assessment and plan were performed by me.   Rectal exam w/ female RN present - swollen and inflamed hemorrhoids, sl tender and rusty stool. No mass.  I think she has had anorectal bleeding with hemorrhoids, stercoral ulcer all likely etiologies.  1) HC supp 2.5% 2) 2% lidocaine jelly prn  3) We will at least chart check tomorrow but do not anticipate needing to perform any endoscopic or CT evaluation. 4) resume anti-PLt Tx at your discretion  Gatha Mayer, MD, [facility] 07/06/2021 2:51 PM

## 2021-07-06 NOTE — Progress Notes (Signed)
   07/06/21 0034  Vitals  Temp 98.7 F (37.1 C)  Temp Source Oral  BP 116/78  MAP (mmHg) 91  BP Location Left Arm  BP Method Automatic  Patient Position (if appropriate) Lying  Pulse Rate 84  Resp 17  Level of Consciousness  Level of Consciousness Alert  MEWS COLOR  MEWS Score Color Green  Oxygen Therapy  SpO2 98 %  O2 Device Room Air  Patient Activity (if Appropriate) In bed  Pain Assessment  Pain Scale 0-10  Pain Score 6  Pain Type Chronic pain  Pain Location Back  Pain Orientation Lower  Pain Descriptors / Indicators Aching  Pain Frequency Constant  Pain Onset On-going  Patients Stated Pain Goal 0  Pain Intervention(s) Emotional support;Repositioned  MEWS Score  MEWS Temp 0  MEWS Systolic 0  MEWS Pulse 0  MEWS RR 0  MEWS LOC 0  MEWS Score 0  Provider Notification  Provider Name/Title Dr. Hal Hope  Date Provider Notified 07/06/21  Time Provider Notified 0038  Notification Type Page  Notification Reason Change in status  Provider response See new orders  Date of Provider Response 07/06/21   Patient had large maroon liquid stool.  Prominent hemorrhoids noted.  VS taken and stable.  Afterwards, she continued to ooze maroon colored stool.  She had another moderate sized maroon colored stool.  Dr. Hal Hope made aware.  Stat CBC ordered.  Hgb on 9/16 was 6.5 and patient required 1 Unit of PRBC.  Hbg 9/18 was 8.3 and now Hbg is 8.1.  MD ordered one unit of PRBC.  MD wanted gauze packed slightly into the rectum to see if this became saturated.  This was very painful for the patient.  Upon reassessment, the packing was out and slightly stained with blood.  Another packing was placed.  Called and verified with MD if he wanted unit of blood transfused since HGB >8.  He stated as long as packing is not saturated, hold the blood.  VS remain stable.  Sharyn Lull RN with [facility] made of situation and will put patient on the list to watch.  Also, second PIV obtained by IV  Team.  Will continue to monitor patient.  Earleen Reaper RN

## 2021-07-06 NOTE — Progress Notes (Signed)
PROGRESS NOTE    Alyssa [name]  XBL:[id number] DOB: Jun 29, 1941 DOA: 06/15/2021 PCP: [name], DO   Chief Complaint  Patient presents with   Generalized Body Aches   Brief Narrative: 80 year old female with hypertension, HLD, COPD, PVD, CKD stage IV, COVID-19 infection in 2021, dementia presents with pain all over x3 days along with a rash.  She was seen in the ED creatinine 3.1, troponin 31 chest x-ray left lower lobe collapse COVID-19 negative CT of the chest abdomen pelvis showed left lower lobe collapse without visible mass or adenopathy and femoral bypass graft with increased fluid collection and bypass anastomosis measuring 4.8 X4.1 previously 2.6 X2.6 Patient was given valacyclovir IV fluids and admitted for further management for dyspnea due to left lung collapse, hypertension urgency, weight loss, borderline elevated troponin, nongap metabolic acidosis, anemia.  Patient was managed with Valtrex, pain control supplemental oxygen. Seen by PT OT and skilled nursing facility recommended. Pain medication adjusted for pain control ,IR consulted but unable to do nerve block. 9/12-anesthesia was able to inject ropivacaine at T11 level-with some improvement in her pain 9/13-antibiotic were changed to Zosyn -WBC count downtrending. Mild confusion-??  History of dementia may have MCI per son was not diagnosed dementia previously.  Suspecting delirium, CT head 9/14 no acute finding. Patient had fecal impaction constipation that improved with enema since then her abdomen pain is stable 9/16-started weaning down on her scheduled oxycodone, hemoglobin was low 6.5 g needing 1 unit PRBC transfusion.  Subjective: OvernightOozing of blood per rectum this morning GI consulted, heparin/aspirin held packing done on perirectal area. Seen and examined this morning.  Alert awake oriented resting comfortably.  Pain is controlled.  Has not needed prn oxycodone. No more bleeding since  packing.  Assessment & Plan:  Left lower lung collapse/chest to splinting Acute hypoxic respiratory failure 2/2 above Chronic COPD-compensated Continue pain control, bronchodilators, as needed supplemental oxygen outpatient pulmonary follow-up w/ repeat CT chest  in a month-if still not reexpanded then may need inspection bronchoscopy.At risk of developing pneumonia.  Acute Shingles/herpes zoster viral involving up to T11 dermatome(Left flank and abdomen): completed Valtrex.  Rash saline healing, no more on isolation.  Pain management as per palliative care.  Completed oral steroid 9/17. s/p rupivacaine injection at T11 on 9/12.  Was on oxybutynin wean down to 7.5> further to 5 mg every 8 hours, not needing much prn Oxy IR.  On Cymbalta, Pepcid, and Lyrica, lidocaine patch.  Mild confusion-??  History of dementia may have MCI per son was not diagnosed dementia previously.  Suspecting delirium, CT head 9/14 no acute finding.  UA grossly abnormal but already covered with antibiotics- possible UTI.  Nonfocal on exam  Leukocytosis: Likely due to steroid, at risk of pneumonia 2/2 #1.  Completed 7 days of Zosyn  9/18, leukocytosis resolving  procal was in 0.3 Recent Labs  Lab 06/30/21 0434 07/01/21 0422 07/02/21 0326 07/03/21 0417 07/04/21 0311 07/05/21 0521 07/06/21 0202  WBC 26.4*   < > 15.9* 13.8* 13.9* 11.7* 10.4  PROCALCITON 0.39  --   --   --   --   --   --    < > = values in this interval not displayed.    Rectal bleeding 9/19 a.m.: GI consulted andr/o diverticular bleeding, patient on aspirin Plavix and heparin ? hemorrhoids/fissure/stercoral ulver patient had episodes of constipation several days ago needing enema x2.  H&H has been stable 8.1 g on repeat.  Hold off transfusion unless starts to drop  more.  Await further GI include Recent Labs  Lab 07/03/21 1624 07/04/21 0311 07/05/21 0521 07/06/21 0202 07/06/21 1008  HGB 8.6* 8.4* 8.3* 8.1* 8.1*  HCT 25.6* 24.1* 24.8* 23.8*  24.0*    Constipation/fecal impaction: Resolved with enema.  BM x4 overnight. Hold laxatives.   Hypertension urgency: BP controlled with amlodipine, Coreg, clonidine.  prn oral hydralazine.  CKD stage IV Metabolic  acidosis Hyperkalemia: previous creatinine ~2.6-2.9.holding stable.  Encourage p.o. continue patient's Lokelma and bicarb orally will need outpatient nephrology follow-up very closely.  At risk of worsening renal failure.  Recent Labs  Lab 07/01/21 0422 07/02/21 0326 07/03/21 0417 07/04/21 0311 07/05/21 0521  BUN 45* 42* 40* 38* 35*  CREATININE 2.32* 2.63* 2.73* 2.81* 2.53*   Recent Labs  Lab 07/01/21 0422 07/02/21 0326 07/03/21 0417 07/04/21 0311 07/05/21 0521  K 5.1 5.2* 5.0 4.5 3.7     PVD- Fem fem bypass  in 2011/CT on admission showing fluid collection from pseudoaneurysm, cannot Dr. 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 She is on Plavix aspirin-statin. W/ rectal bleeding holding antiplatelet this morning  History of tobacco abuse-has quit few months ago Moderate protein calorie malnutrition: Augment diet as tolerated-change from renal to regular diet to liberalize diet.  RD following  Deconditioning: SNF has been recommended by PT OT and is deconditioned and weak respiratory admission  HLD Cont statin  Anemia of chronic kidney disease:Hb down trended 6.5 gm ?? from hemodilution from IV fluids, s/p 1 unit PRBC ,H&H stable.  Monitor  Recent Labs  Lab 07/03/21 0417 07/03/21 1624 07/04/21 0311 07/05/21 0521 07/06/21 0202  HGB 6.5* 8.6* 8.4* 8.3* 8.1*  HCT 20.1* 25.6* 24.1* 24.8* 23.8*   Depression with anxiety-stable mood  Goals of care: DNR, seen by palliative care.  Prognosis does not appear bright in this patient with multiple comorbidities.  Appreciate palliative care input.  Diet Order             Diet  clear liquid Room service appropriate? Yes; Fluid consistency: Thin  Diet effective now                   Nutrition Problem: Moderate Malnutrition Etiology: chronic illness (dementia, COPD) Signs/Symptoms: mild fat depletion, moderate fat depletion, mild muscle depletion, moderate muscle depletion Interventions: Ensure Enlive (each supplement provides 350kcal and 20 grams of protein), MVI, Liberalize Diet Patient's Body mass index is 19.49 kg/m.  DVT prophylaxis: Place and maintain sequential compression device Start: 07/06/21 0201 Code Status:   Code Status: DNR  Family Communication: plan of care discussed with patient, I had updated patient's son at bedside previously.  Status is: Inpatient Remains inpatient appropriate because:Inpatient level of care appropriate due to severity of illness  Dispo:The patient is from: Home            Anticipated d/c is to: SNF.  Insurance has denied skilled nursing facility and expedited appeal in process.             Patient currently is not medically stable            Difficult to place patient No  Unresulted Labs (From admission, onward)    None      Medications reviewed:  Scheduled Meds:  sodium chloride   Intravenous Once   oxyCODONE  7.5 mg Oral Q8H   And   acetaminophen  1,000 mg  Oral Q8H   amLODipine  10 mg Oral QHS   arformoterol  15 mcg Nebulization BID   And   budesonide (PULMICORT) nebulizer solution  0.25 mg Nebulization BID   And   revefenacin  175 mcg Nebulization Daily   capsaicin   Topical TID   carvedilol  12.5 mg Oral BID   cloNIDine  0.2 mg Oral QHS   clopidogrel  75 mg Oral Daily   donepezil  5 mg Oral Q2200   DULoxetine  40 mg Oral Daily   feeding supplement  237 mL Oral TID BM   insulin aspart  0-5 Units Subcutaneous QHS   insulin aspart  0-6 Units Subcutaneous TID WC   lidocaine  2 patch Transdermal Q24H   megestrol  40 mg Oral BID   multivitamin with minerals  1 tablet Oral Daily   pantoprazole  40 mg  Oral Daily   polyethylene glycol  17 g Oral BID   pregabalin  75 mg Oral Daily   rosuvastatin  10 mg Oral Daily   sodium bicarbonate  650 mg Oral BID   sodium chloride flush  3 mL Intravenous Q12H   sodium zirconium cyclosilicate  10 g Oral Daily   Continuous Infusions:   Consultants:see note  Procedures:see note Antimicrobials: Anti-infectives (From admission, onward)    Start     Dose/Rate Route Frequency Ordered Stop   06/30/21 1615  piperacillin-tazobactam (ZOSYN) IVPB 2.25 g        2.25 g 100 mL/hr over 30 Minutes Intravenous Every 8 hours 06/30/21 1518 07/04/21 2317   06/28/21 1230  ampicillin-sulbactam (UNASYN) 1.5 g in sodium chloride 0.9 % 100 mL IVPB  Status:  Discontinued        1.5 g 200 mL/hr over 30 Minutes Intravenous Every 12 hours 06/28/21 1127 06/30/21 1518   06/17/21 0800  valACYclovir (VALTREX) tablet 1,000 mg        1,000 mg Oral Daily 06/16/21 0839 06/22/21 0900   06/16/21 0515  valACYclovir (VALTREX) tablet 1,000 mg        1,000 mg Oral STAT 06/16/21 0502 06/16/21 0543      Culture/Microbiology    Component Value Date/Time   SDES URINE, RANDOM 09/29/2020 2353   SPECREQUEST  09/29/2020 2353    NONE Performed at [facility], [place] [address] [place], [place] [address]    CULT 20,000 COLONIES/mL ENTEROBACTER AEROGENES (A) 09/29/2020 2353   REPTSTATUS 10/01/2020 FINAL 09/29/2020 2353    Other culture-see note  Objective: Vitals: Today's Vitals   07/05/21 2225 07/05/21 2323 07/06/21 0034 07/06/21 0156  BP: (!) 156/91  116/78 (!) 143/79  Pulse: 88  84 79  Resp: 18  17   Temp: 99.7 F (37.6 C)  98.7 F (37.1 C)   TempSrc: Oral  Oral   SpO2: 99%  98% 97%  Weight:      Height:      PainSc:  10-Worst pain ever 6  4     Intake/Output Summary (Last 24 hours) at 07/06/2021 0727 Last data filed at 07/06/2021 0200 Gross per 24 hour  Intake 540 ml  Output 0 ml  Net 540 ml    Filed Weights   06/25/21 2139 06/29/21 1017 06/29/21 2141   Weight: 49.9 kg 49.9 kg 49.9 kg   Weight change:   Intake/Output from previous day: 09/18 0701 - 09/19 0700 In: 540 [P.O.:540] Out: 0  Intake/Output this shift: No intake/output data recorded. Filed Weights   06/25/21 2139 06/29/21 1017  06/29/21 2141  Weight: 49.9 kg 49.9 kg 49.9 kg   Examination: General exam: AAO at baseline, elderly, frail, pleasant. On RA HEENT:Oral mucosa moist, Ear/Nose WNL grossly, dentition normal. Respiratory system:B/l air entry +,slightly diminished at the left base posteriorly, no use of accessory muscle Cardiovascular system:S1 & S2 +, No JVD,. Gastrointestinal system:Abdomen soft,NT,ND, BS+ Nervous System:Alert, awake, moving extremities and grossly nonfocal Extremities: No edema, distal peripheral pulses palpable.  Skin: No rashes healed shingles on the left abdomen and left flank,no icterus. MSK: Normal muscle bulk,tone, power   Data Reviewed: I have personally reviewed following labs and imaging studies CBC: Recent Labs  Lab 07/02/21 0326 07/03/21 0417 07/03/21 1624 07/04/21 0311 07/05/21 0521 07/06/21 0202  WBC 15.9* 13.8*  --  13.9* 11.7* 10.4  HGB 7.0* 6.5* 8.6* 8.4* 8.3* 8.1*  HCT 20.9* 20.1* 25.6* 24.1* 24.8* 23.8*  MCV 84.6 85.9  --  82.0 83.8 83.5  PLT 487* 463*  --  459* 490* 471*    Basic Metabolic Panel: Recent Labs  Lab 07/01/21 0422 07/02/21 0326 07/03/21 0417 07/04/21 0311 07/05/21 0521  NA 140 140 140 139 142  K 5.1 5.2* 5.0 4.5 3.7  CL 105 109 107 109 113*  CO2 20* 20* 19* 16* 18*  GLUCOSE 84 99 92 101* 104*  BUN 45* 42* 40* 38* 35*  CREATININE 2.32* 2.63* 2.73* 2.81* 2.53*  CALCIUM 9.0 8.7* 8.6* 8.3* 8.6*    GFR: Estimated Creatinine Clearance: 14.7 mL/min (A) (by C-G formula based on SCr of 2.53 mg/dL (H)). Liver Function Tests: No results for input(s): AST, ALT, ALKPHOS, BILITOT, PROT, ALBUMIN in the last 168 hours.  No results for input(s): LIPASE, AMYLASE in the last 168 hours. No results for  input(s): AMMONIA in the last 168 hours. Coagulation Profile: No results for input(s): INR, PROTIME in the last 168 hours. Cardiac Enzymes: No results for input(s): CKTOTAL, CKMB, CKMBINDEX, TROPONINI in the last 168 hours. BNP (last 3 results) No results for input(s): PROBNP in the last 8760 hours. HbA1C: No results for input(s): HGBA1C in the last 72 hours.  CBG: Recent Labs  Lab 07/04/21 2039 07/05/21 0705 07/05/21 1829 07/05/21 2234 07/06/21 0651  GLUCAP 160* 87 172* 153* 102*    Lipid Profile: No results for input(s): CHOL, HDL, LDLCALC, TRIG, CHOLHDL, LDLDIRECT in the last 72 hours. Thyroid Function Tests: No results for input(s): TSH, T4TOTAL, FREET4, T3FREE, THYROIDAB in the last 72 hours. Anemia Panel: No results for input(s): VITAMINB12, FOLATE, FERRITIN, TIBC, IRON, RETICCTPCT in the last 72 hours. Sepsis Labs: Recent Labs  Lab 06/30/21 0434  PROCALCITON 0.39     No results found for this or any previous visit (from the past 240 hour(s)).    Radiology Studies: No results found.   LOS: 17 days   Antonieta Pert, MD [facility]  07/06/2021, 7:27 AM

## 2021-07-07 DIAGNOSIS — K625 Hemorrhage of anus and rectum: Secondary | ICD-10-CM | POA: Diagnosis not present

## 2021-07-07 DIAGNOSIS — K6289 Other specified diseases of anus and rectum: Secondary | ICD-10-CM | POA: Diagnosis not present

## 2021-07-07 LAB — BPAM RBC
Blood Product Expiration Date: 202210132359
Blood Product Expiration Date: 202210162359
ISSUE DATE / TIME: 202209161110
Unit Type and Rh: 5100
Unit Type and Rh: 5100

## 2021-07-07 LAB — TYPE AND SCREEN
ABO/RH(D): O POS
Antibody Screen: NEGATIVE
Unit division: 0
Unit division: 0

## 2021-07-07 LAB — CBC
HCT: 26.1 % — ABNORMAL LOW (ref 36.0–46.0)
Hemoglobin: 8.9 g/dL — ABNORMAL LOW (ref 12.0–15.0)
MCH: 28.1 pg (ref 26.0–34.0)
MCHC: 34.1 g/dL (ref 30.0–36.0)
MCV: 82.3 fL (ref 80.0–100.0)
Platelets: 486 10*3/uL — ABNORMAL HIGH (ref 150–400)
RBC: 3.17 MIL/uL — ABNORMAL LOW (ref 3.87–5.11)
RDW: 18.3 % — ABNORMAL HIGH (ref 11.5–15.5)
WBC: 8.7 10*3/uL (ref 4.0–10.5)
nRBC: 0.3 % — ABNORMAL HIGH (ref 0.0–0.2)

## 2021-07-07 LAB — BASIC METABOLIC PANEL
Anion gap: 11 (ref 5–15)
BUN: 31 mg/dL — ABNORMAL HIGH (ref 8–23)
CO2: 18 mmol/L — ABNORMAL LOW (ref 22–32)
Calcium: 8.8 mg/dL — ABNORMAL LOW (ref 8.9–10.3)
Chloride: 111 mmol/L (ref 98–111)
Creatinine, Ser: 2.08 mg/dL — ABNORMAL HIGH (ref 0.44–1.00)
GFR, Estimated: 24 mL/min — ABNORMAL LOW (ref >60)
Glucose, Bld: 98 mg/dL (ref 70–99)
Potassium: 4.5 mmol/L (ref 3.5–5.1)
Sodium: 140 mmol/L (ref 135–145)

## 2021-07-07 LAB — GLUCOSE, CAPILLARY
Glucose-Capillary: 86 mg/dL (ref 70–99)
Glucose-Capillary: 131 mg/dL — ABNORMAL HIGH (ref 70–99)
Glucose-Capillary: 116 mg/dL — ABNORMAL HIGH (ref 70–99)
Glucose-Capillary: 96 mg/dL (ref 70–99)

## 2021-07-07 LAB — HEMOGLOBIN AND HEMATOCRIT, BLOOD
HCT: 26.6 % — ABNORMAL LOW (ref 36.0–46.0)
Hemoglobin: 8.9 g/dL — ABNORMAL LOW (ref 12.0–15.0)

## 2021-07-07 MED ORDER — CLOPIDOGREL BISULFATE 75 MG PO TABS
75.0000 mg | ORAL_TABLET | Freq: Every day | ORAL | Status: DC
  Administered 2021-07-07 – 2021-07-10 (×4): 75 mg via ORAL
  Filled 2021-07-07 (×3): qty 1

## 2021-07-07 MED ORDER — ASPIRIN EC 81 MG PO TBEC
81.0000 mg | DELAYED_RELEASE_TABLET | Freq: Every day | ORAL | Status: DC
  Administered 2021-07-07 – 2021-07-10 (×4): 81 mg via ORAL
  Filled 2021-07-07 (×3): qty 1

## 2021-07-07 NOTE — Progress Notes (Signed)
Occupational Therapy Treatment Patient Details Name: Alyssa [name] MRN: [id number] DOB: 1941-01-25 Today's Date: 07/07/2021   History of present illness 80 y/o female admitted 8/29  secondary to worsening shortness of breath, rash in the left flank/abdomen that progressed to the back, productive cough and weight loss. Found to have shingles, hypoxic respiratory failure and hypertensive urgency. PMH inclues PVD, COPD, HTN, and covid.   OT comments  Patient seen by skilled OT to address bed mobility, transfers, and grooming.  Patient continues to have complaints of pain and nursing stated she had received pain meds prior to OT visit.  Patient was supervision to get to eob and required assistance to address donning socks.  Patient stood from eob for perineal care with nursing and transferred to recliner to perform grooming.  Patient tolerated treatment well and is progressing with OT treatment.  Acute OT to continue to follow.    Recommendations for follow up therapy are one component of a multi-disciplinary discharge planning process, led by the attending physician.  Recommendations may be updated based on patient status, additional functional criteria and insurance authorization.    Follow Up Recommendations  SNF    Equipment Recommendations  3 in 1 bedside commode;Wheelchair (measurements OT);Wheelchair cushion (measurements OT)    Recommendations for Other Services      Precautions / Restrictions Precautions Precautions: Fall       Mobility Bed Mobility Overal bed mobility: Needs Assistance Bed Mobility: Supine to Sit Rolling: Min guard   Supine to sit: Supervision;HOB elevated     General bed mobility comments: able to use rail to assist and HOB raised    Transfers Overall transfer level: Needs assistance Equipment used: Rolling walker (2 wheeled) Transfers: Sit to/from [employer] Sit to Stand: Min guard;Min assist         General transfer  comment: assistance neeed with RW use    Balance Overall balance assessment: Needs assistance Sitting-balance support: No upper extremity supported;Feet supported Sitting balance-Leahy Scale: Fair Sitting balance - Comments: able to donn socks sitting eob   Standing balance support: Bilateral upper extremity supported Standing balance-Leahy Scale: Poor Standing balance comment: reliant on UE support with RW when standing and min guard                           ADL either performed or assessed with clinical judgement   ADL Overall ADL's : Needs assistance/impaired     Grooming: Wash/dry hands;Wash/dry face;Oral care;Set up;Sitting Grooming Details (indicate cue type and reason): performed sitting in recliner             Lower Body Dressing: Moderate assistance;Sitting/lateral leans Lower Body Dressing Details (indicate cue type and reason): therapist assisted with beginning donning socks and patient completed task             Functional mobility during ADLs: Min guard;Rolling walker General ADL Comments: Paient was willing to performed grooming seated in recliner, declined performing standing     Vision       Perception     Praxis      Cognition Arousal/Alertness: Awake/alert Behavior During Therapy: WFL for tasks assessed/performed Overall Cognitive Status: Impaired/Different from baseline Area of Impairment: Awareness;Safety/judgement                         Safety/Judgement: Decreased awareness of safety;Decreased awareness of deficits Awareness: Intellectual   General Comments: awake and eager to get into recliner  Exercises     Shoulder Instructions       General Comments      Pertinent Vitals/ Pain       Pain Assessment: 0-10 Pain Score: 8  Faces Pain Scale: Hurts little more Pain Location: abdomin/hip Pain Descriptors / Indicators: Grimacing Pain Intervention(s): Premedicated before session  Home Living                                           Prior Functioning/Environment              Frequency  Min 2X/week        Progress Toward Goals  OT Goals(current goals can now be found in the care plan section)  Progress towards OT goals: Progressing toward goals  Acute Rehab OT Goals Patient Stated Goal: feel better OT Goal Formulation: With patient Time For Goal Achievement: 07/04/21 Potential to Achieve Goals: Fair ADL Goals Pt Will Perform Grooming: with set-up;sitting;standing Pt Will Perform Lower Body Dressing: with set-up;sit to/from stand Pt Will Transfer to Toilet: with modified independence;ambulating;regular height toilet Pt/caregiver will Perform Home Exercise Program: Increased strength;Both right and left upper extremity;With theraband;With Supervision;With written HEP provided  Plan Discharge plan remains appropriate    Co-evaluation                 AM-PAC OT "6 Clicks" Daily Activity     Outcome Measure   Help from another person eating meals?: None Help from another person taking care of personal grooming?: A Little Help from another person toileting, which includes using toliet, bedpan, or urinal?: A Lot Help from another person bathing (including washing, rinsing, drying)?: A Lot Help from another person to put on and taking off regular upper body clothing?: A Little Help from another person to put on and taking off regular lower body clothing?: A Lot 6 Click Score: 16    End of Session Equipment Utilized During Treatment: Rolling walker  OT Visit Diagnosis: Unsteadiness on feet (R26.81);Muscle weakness (generalized) (M62.81);Pain   Activity Tolerance Patient limited by fatigue;Patient limited by pain   Patient Left in chair;with call bell/phone within reach;with chair alarm set;with nursing/sitter in room   Nurse Communication Patient requests pain meds        Time: 0757-0821 OT Time Calculation (min): 24 min  Charges:  OT General Charges $OT Visit: 1 Visit OT Treatments $Self Care/Home Management : 23-37 mins  Lodema Hong, OTA   Trixie Dredge 07/07/2021, 8:32 AM

## 2021-07-07 NOTE — TOC Progression Note (Signed)
Transition of Care [facility]) - Progression Note    Patient Details  Name: CHERIL SLATTERY MRN: [id number] Date of Birth: 1940-11-29  [facility] CM/SW Contact  [name], LCSW Phone Number: 07/07/2021, 3:46 PM  Clinical Narrative:  CSW received call from [facility] with [employer]. Updated clinicals requested. She provided her phone number - [phone] and her fax number - [phone]. Checked notes and there is not a recent PT note. Called PT/OT office and per Jarrett Soho, PT schedule to see patient today.   3:34 pm: Call received from [facility] with [facility] regarding requested clinicals and she was informed that OT saw patient earlier today, but was waiting for PT to see. Checked notes while on phone with [facility] and PT did attempt to work with patient this afternoon but she refused. [facility] requested that clinicals be faxed. CSW faxed OT note, recent MD note and PT note.  CSW will continue to follow and await auth decision from [facility] regarding patient going to rehab.   Expected Discharge Plan: [place] Barriers to Discharge: Continued Medical Work up  Expected Discharge Plan and Services Expected Discharge Plan: [place] In-house Referral: Clinical Social Work     Living arrangements for the past 2 months: Single Family Home                                       Social Determinants of Health (SDOH) Interventions    Readmission Risk Interventions Readmission Risk Prevention Plan 07/10/2020 12/26/2018  Transportation Screening Complete Complete  Medication Review [employer]) Complete Complete  PCP or Specialist appointment within 3-5 days of discharge - Not Complete  PCP/Specialist Appt Not Complete comments - pt for snf. Snf MD to see as needed.  [place] or Home Care Consult Complete Not Complete  HRI or Home Care Consult Pt Refusal Comments - NA  SW Recovery Care/Counseling Consult Complete Not Complete  SW Consult  Not Complete Comments - NA  Palliative Care Screening Not Applicable Complete  Skilled Nursing Facility Not Applicable Complete  Some recent data might be hidden

## 2021-07-07 NOTE — Progress Notes (Addendum)
          Daily Rounding Note  07/07/2021, 12:09 PM  LOS: 18 days   SUBJECTIVE:   Chief complaint:    Hematochezia.  Stools are diarrheal, brown.  There was a little blip of blood overnight.  OBJECTIVE:         Vital signs in last 24 hours:    Temp:  [98.5 F (36.9 C)-99.1 F (37.3 C)] 98.7 F (37.1 C) (09/20 0535) Pulse Rate:  [68-86] 77 (09/20 1006) Resp:  [18] 18 (09/20 1006) BP: (129-164)/(88-114) 155/91 (09/20 1006) SpO2:  [96 %-98 %] 98 % (09/20 1006) Weight:  [58.1 kg] 58.1 kg (09/19 2129) Last BM Date: 07/06/21 Filed Weights   06/29/21 1017 06/29/21 2141 07/06/21 2129  Weight: 49.9 kg 49.9 kg 58.1 kg   General: Frail, ill-appearing, uncomfortable. Heart: RRR. Chest: Diminished breath sounds but clear.  No labored breathing. Abdomen: Soft.  Tender.  Healing zoster rash in lower abdomen left > right. Extremities: Minor, nonpitting pedal edema. Neuro/Psych: Follows commands.  Groaning a bit due to feeling cold and having pain from her zoster.   Lab Results: Recent Labs    07/05/21 0521 07/06/21 0202 07/06/21 1008 07/06/21 1625 07/07/21 0451 07/07/21 0922  WBC 11.7* 10.4  --   --  8.7  --   HGB 8.3* 8.1*   < > 7.9* 8.9* 8.9*  HCT 24.8* 23.8*   < > 23.5* 26.1* 26.6*  PLT 490* 471*  --   --  486*  --    < > = values in this interval not displayed.   BMET Recent Labs    07/05/21 0521 07/07/21 0451  NA 142 140  K 3.7 4.5  CL 113* 111  CO2 18* 18*  GLUCOSE 104* 98  BUN 35* 31*  CREATININE 2.53* 2.08*  CALCIUM 8.6* 8.8*   L  ASSESMENT:   Bleeding per rectum, rectal tenderness.  Swollen, inflamed hemorrhoids and rectal tenderness, rusty stools on DRE.  Hydrocortisone suppositories, prn lidocaine gel now in place.  Stools consistently brown in last 18 hours.    Anemia.  Hgb 6.5 >> 1 PRBC >> 8.9.      AKI.      Shingles w rash, dermatomal pain.    Chronic Plavix.  Not on hold.  PLAN    Continue daily PPI so long as she is on antiplatelet medication or blood thinners.  Avoid oral iron which she was taking prior to admission, if she needs iron, consider parenteral iron.  MiraLAX was discontinued by attending physician, Dulcolax suppositories prn in place.  May want to consider once daily MiraLAX however.    Alyssa [name]  07/07/2021, 12:09 PM Phone [phone]     [facility] Attending   I have taken an interval history, reviewed the chart and examined the patient. I agree with the Advanced Practitioner's note, impression and recommendations.  Majority the medical decision-making in the formulation of the assessment and plan were performed by me.  Things have improved. I think impaction/enema caused this.  Continue supportive care and adjust bowel regimen depending upon how things go. Agree w/ holding miraLax at this time.  Signing off  Gatha Mayer, MD, [facility] 07/07/2021 3:46 PM

## 2021-07-07 NOTE — Progress Notes (Signed)
PROGRESS NOTE    Alyssa [name]  YOV:[id number] DOB: Jun 30, 1941 DOA: 06/15/2021 PCP: [name], DO   Chief Complaint  Patient presents with   Generalized Body Aches   Brief Narrative: 80 year old female with hypertension, HLD, COPD, PVD, CKD stage IV, COVID-19 infection in 2021, dementia presents with pain all over x3 days along with a rash.  She was seen in the ED creatinine 3.1, troponin 31 chest x-ray left lower lobe collapse COVID-19 negative CT of the chest abdomen pelvis showed left lower lobe collapse without visible mass or adenopathy and femoral bypass graft with increased fluid collection and bypass anastomosis measuring 4.8 X4.1 previously 2.6 X2.6 Patient was given valacyclovir IV fluids and admitted for further management for dyspnea due to left lung collapse, hypertension urgency, weight loss, borderline elevated troponin, nongap metabolic acidosis, anemia.  Patient was managed with Valtrex, pain control supplemental oxygen. Seen by PT OT and skilled nursing facility recommended. Pain medication adjusted for pain control ,IR consulted but unable to do nerve block. 9/12-anesthesia was able to inject ropivacaine at T11 level-with some improvement in her pain 9/13-antibiotic were changed to Zosyn -WBC count downtrending. Mild confusion-??  History of dementia may have MCI per son was not diagnosed dementia previously.  Suspecting delirium, CT head 9/14 no acute finding. Patient had fecal impaction constipation that improved with enema since then her abdomen pain is stable 9/16-started weaning down on her scheduled oxycodone, hemoglobin was low 6.5 g needing 1 unit PRBC transfusion. 9/19 patient was noted to have a rectal bleeding seen by GI-suspected and a rectal bleeding with hemorrhoids stercoral ulcer-and per GI colonoscopy would not be helpful and managed with lidocaine jelly as needed HC suppository 2.5% and okay to resume Plavix/asa.  Subjective: Rectal bleeding no  recurrence.  Up on the bedside chair appears much better. Pain is stable.  Assessment & Plan:  Left lower lung collapse/chest to splinting Acute hypoxic respiratory failure 2/2 above Chronic COPD-compensated Continue pain control, bronchodilators, as needed supplemental oxygen outpatient pulmonary follow-up w/ repeat CT chest  in a month-if still not reexpanded then may need inspection bronchoscopy.At risk of developing pneumonia.  Acute Shingles/herpes zoster viral involving up to T11 dermatome(Left flank and abdomen): completed Valtrex.  Rash saline healing, no more on isolation.  Pain management as per palliative care.  Completed oral steroid 9/17. s/p rupivacaine injection at T11 on 9/12.  Was on oxycodone  5 mg every 8 hours-agreeable for making it as needed. Cont on Cymbalta, Pepcid, and Lyrica, lidocaine patch.  Mild confusion-??  History of dementia may have MCI per son was not diagnosed dementia previously.  Suspecting delirium, CT head 9/14 no acute finding.  UA grossly abnormal but already covered with antibiotics- possible UTI.  Nonfocal on exam  Leukocytosis: Likely due to steroid, at risk of pneumonia 2/2 #1.  Completed 7 days of Zosyn  9/18, leukocytosis has resolved essentially and afebrile.   Recent Labs  Lab 07/03/21 0417 07/04/21 0311 07/05/21 0521 07/06/21 0202 07/07/21 0451  WBC 13.8* 13.9* 11.7* 10.4 8.7   Rectal bleeding 9/19 a.m.:  seen by GI-suspected and a rectal bleeding with hemorrhoids stercoral ulcer-and per GI colonoscopy would not be helpful and managed with lidocaine jelly as needed , HC suppository 2.5% and okay to resume Plavix/aspirin and monitor  more night. Hemoglobin is stable no recurrence. Recent Labs  Lab 07/06/21 0202 07/06/21 1008 07/06/21 1625 07/07/21 0451 07/07/21 0922  HGB 8.1* 8.1* 7.9* 8.9* 8.9*  HCT 23.8* 24.0* 23.5* 26.1* 26.6*  Constipation/fecal impaction: Resolved with enema.h no recurrent old laxatives.   Hypertension  urgency: BP controlled with amlodipine, Coreg, clonidine.  prn oral hydralazine.  CKD stage IV Metabolic  acidosis Hyperkalemia: previous creatinine ~2.6-2.9.holding stable at 2.0 this morning.  Encourage oral hydration.  Continue Lokelma at 5 mg, and oral bicarb.  Follow-up with nephrology as outpatient.  Recent Labs  Lab 07/02/21 0326 07/03/21 0417 07/04/21 0311 07/05/21 0521 07/07/21 0451  BUN 42* 40* 38* 35* 31*  CREATININE 2.63* 2.73* 2.81* 2.53* 2.08*     Recent Labs  Lab 07/02/21 0326 07/03/21 0417 07/04/21 0311 07/05/21 0521 07/07/21 0451  K 5.2* 5.0 4.5 3.7 4.5    PVD- Fem fem bypass  in 2011/CT on admission showing fluid collection from pseudoaneurysm, cannot Dr. 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  Back on  Plavix aspirin-statin.  History of tobacco abuse-has quit few months ago Moderate protein calorie malnutrition: Augment diet as tolerated-change from renal to regular diet to liberalize diet.  RD following  Deconditioning: SNF has been recommended by PT OT and is deconditioned and weak respiratory admission  HLD Cont statin  Anemia of chronic kidney disease:Hb down trended 6.5 gm ?? from hemodilution from IV fluids, s/p 1 unit PRBC ,H&H stable.  Monitor  Recent Labs  Lab 07/06/21 0202 07/06/21 1008 07/06/21 1625 07/07/21 0451 07/07/21 0922  HGB 8.1* 8.1* 7.9* 8.9* 8.9*  HCT 23.8* 24.0* 23.5* 26.1* 26.6*  Depression with anxiety-stable mood  Goals of care: DNR, seen by palliative care.  Prognosis does not appear bright in this patient with multiple comorbidities.  Appreciate palliative care input.  Diet Order             Diet clear liquid Room service appropriate? Yes; Fluid consistency: Thin  Diet effective now                   Nutrition Problem: Moderate Malnutrition Etiology: chronic illness  (dementia, COPD) Signs/Symptoms: mild fat depletion, moderate fat depletion, mild muscle depletion, moderate muscle depletion Interventions: Ensure Enlive (each supplement provides 350kcal and 20 grams of protein), MVI, Liberalize Diet Patient's Body mass index is 22.69 kg/m.  DVT prophylaxis: Place and maintain sequential compression device Start: 07/06/21 0201 Code Status:   Code Status: DNR  Family Communication: plan of care discussed with patient, I had updated patient's son at bedside previously.  Status is: Inpatient Remains inpatient appropriate because:Inpatient level of care appropriate due to severity of illness  Dispo:The patient is from: Home            Anticipated d/c is to: SNF.  Insurance has denied skilled nursing facility and expedited appeal in process.             Patient currently is not medically stable            Difficult to place patient No  Unresulted Labs (From admission, onward)     Start     Ordered   07/07/21 4503  Basic metabolic panel  Daily,   R     Question:  Specimen collection method  Answer:  Lab=Lab collect   07/06/21 0738   07/07/21 0500  CBC  Daily,   R     Question:  Specimen collection method  Answer:  Lab=Lab collect   07/06/21 0738   07/06/21 1600  Hemoglobin and hematocrit, blood  Now then every  8 hours,   R     Question:  Specimen collection method  Answer:  Lab=Lab collect   07/06/21 1309           Medications reviewed:  Scheduled Meds:  oxyCODONE  5 mg Oral Q8H   And   acetaminophen  1,000 mg Oral Q8H   amLODipine  10 mg Oral QHS   arformoterol  15 mcg Nebulization BID   And   budesonide (PULMICORT) nebulizer solution  0.25 mg Nebulization BID   And   revefenacin  175 mcg Nebulization Daily   aspirin EC  81 mg Oral Daily   capsaicin   Topical TID   carvedilol  12.5 mg Oral BID   cloNIDine  0.2 mg Oral QHS   clopidogrel  75 mg Oral Daily   donepezil  5 mg Oral Q2200   DULoxetine  40 mg Oral Daily   feeding supplement   237 mL Oral TID BM   hydrocortisone  25 mg Rectal BID   insulin aspart  0-5 Units Subcutaneous QHS   insulin aspart  0-6 Units Subcutaneous TID WC   lidocaine  2 patch Transdermal Q24H   megestrol  40 mg Oral BID   multivitamin with minerals  1 tablet Oral Daily   pantoprazole  40 mg Oral Daily   polyethylene glycol  17 g Oral BID   pregabalin  75 mg Oral Daily   rosuvastatin  10 mg Oral Daily   sodium bicarbonate  650 mg Oral BID   sodium chloride flush  3 mL Intravenous Q12H   sodium zirconium cyclosilicate  5 g Oral Daily   Continuous Infusions:   Consultants:see note  Procedures:see note Antimicrobials: Anti-infectives (From admission, onward)    Start     Dose/Rate Route Frequency Ordered Stop   06/30/21 1615  piperacillin-tazobactam (ZOSYN) IVPB 2.25 g        2.25 g 100 mL/hr over 30 Minutes Intravenous Every 8 hours 06/30/21 1518 07/04/21 2317   06/28/21 1230  ampicillin-sulbactam (UNASYN) 1.5 g in sodium chloride 0.9 % 100 mL IVPB  Status:  Discontinued        1.5 g 200 mL/hr over 30 Minutes Intravenous Every 12 hours 06/28/21 1127 06/30/21 1518   06/17/21 0800  valACYclovir (VALTREX) tablet 1,000 mg        1,000 mg Oral Daily 06/16/21 0839 06/22/21 0900   06/16/21 0515  valACYclovir (VALTREX) tablet 1,000 mg        1,000 mg Oral STAT 06/16/21 0502 06/16/21 0543      Culture/Microbiology    Component Value Date/Time   SDES URINE, RANDOM 09/29/2020 2353   SPECREQUEST  09/29/2020 2353    NONE Performed at [facility], [place] [address], [place], [place] [address]    CULT 20,000 COLONIES/mL ENTEROBACTER AEROGENES (A) 09/29/2020 2353   REPTSTATUS 10/01/2020 FINAL 09/29/2020 2353    Other culture-see note  Objective: Vitals: Today's Vitals   07/07/21 0458 07/07/21 0535 07/07/21 0926 07/07/21 1006  BP:  (!) 152/94  (!) 155/91  Pulse:  82 68 77  Resp:  18 18 18   Temp:  98.7 F (37.1 C)    TempSrc:  Oral    SpO2:  96% 96% 98%  Weight:      Height:       PainSc: 10-Worst pain ever       Intake/Output Summary (Last 24 hours) at 07/07/2021 1151 Last data filed at 07/07/2021 0829 Gross per 24 hour  Intake 480  ml  Output --  Net 480 ml   Filed Weights   06/29/21 1017 06/29/21 2141 07/06/21 2129  Weight: 49.9 kg 49.9 kg 58.1 kg   Weight change:   Intake/Output from previous day: 09/19 0701 - 09/20 0700 In: 480 [P.O.:480] Out: -  Intake/Output this shift: Total I/O In: 120 [P.O.:120] Out: -  Filed Weights   06/29/21 1017 06/29/21 2141 07/06/21 2129  Weight: 49.9 kg 49.9 kg 58.1 kg   Examination: General exam: AAOx3, pleasant, not in distress, on room air  HEENT:Oral mucosa moist, Ear/Nose WNL grossly, dentition normal. Respiratory system: bilaterally air movement present slightly diminished on the left base, no use of accessory muscle Cardiovascular system: S1 & S2 +, No JVD,. Gastrointestinal system: Abdomen soft, NT,ND, BS+ Nervous System:Alert, awake, moving extremities and grossly nonfocal Extremities: mild edema, distal peripheral pulses palpable.  Skin: No rashes,no icterus.  Healing shingles rash on the left abdomen and left trunk MSK: Normal muscle bulk,tone, power   Data Reviewed: I have personally reviewed following labs and imaging studies CBC: Recent Labs  Lab 07/03/21 0417 07/03/21 1624 07/04/21 0311 07/05/21 0521 07/06/21 0202 07/06/21 1008 07/06/21 1625 07/07/21 0451 07/07/21 0922  WBC 13.8*  --  13.9* 11.7* 10.4  --   --  8.7  --   HGB 6.5*   < > 8.4* 8.3* 8.1* 8.1* 7.9* 8.9* 8.9*  HCT 20.1*   < > 24.1* 24.8* 23.8* 24.0* 23.5* 26.1* 26.6*  MCV 85.9  --  82.0 83.8 83.5  --   --  82.3  --   PLT 463*  --  459* 490* 471*  --   --  486*  --    < > = values in this interval not displayed.   Basic Metabolic Panel: Recent Labs  Lab 07/02/21 0326 07/03/21 0417 07/04/21 0311 07/05/21 0521 07/07/21 0451  NA 140 140 139 142 140  K 5.2* 5.0 4.5 3.7 4.5  CL 109 107 109 113* 111  CO2 20* 19* 16* 18* 18*   GLUCOSE 99 92 101* 104* 98  BUN 42* 40* 38* 35* 31*  CREATININE 2.63* 2.73* 2.81* 2.53* 2.08*  CALCIUM 8.7* 8.6* 8.3* 8.6* 8.8*   GFR: Estimated Creatinine Clearance: 18.7 mL/min (A) (by C-G formula based on SCr of 2.08 mg/dL (H)). Liver Function Tests: No results for input(s): AST, ALT, ALKPHOS, BILITOT, PROT, ALBUMIN in the last 168 hours.  No results for input(s): LIPASE, AMYLASE in the last 168 hours. No results for input(s): AMMONIA in the last 168 hours. Coagulation Profile: No results for input(s): INR, PROTIME in the last 168 hours. Cardiac Enzymes: No results for input(s): CKTOTAL, CKMB, CKMBINDEX, TROPONINI in the last 168 hours. BNP (last 3 results) No results for input(s): PROBNP in the last 8760 hours. HbA1C: No results for input(s): HGBA1C in the last 72 hours.  CBG: Recent Labs  Lab 07/06/21 1113 07/06/21 1616 07/06/21 2131 07/07/21 0646 07/07/21 1146  GLUCAP 96 158* 83 86 131*   Lipid Profile: No results for input(s): CHOL, HDL, LDLCALC, TRIG, CHOLHDL, LDLDIRECT in the last 72 hours. Thyroid Function Tests: No results for input(s): TSH, T4TOTAL, FREET4, T3FREE, THYROIDAB in the last 72 hours. Anemia Panel: No results for input(s): VITAMINB12, FOLATE, FERRITIN, TIBC, IRON, RETICCTPCT in the last 72 hours. Sepsis Labs: No results for input(s): PROCALCITON, LATICACIDVEN in the last 168 hours.   No results found for this or any previous visit (from the past 240 hour(s)).    Radiology Studies: No results found.  LOS: 18 days   Antonieta Pert, MD [facility]  07/07/2021, 11:51 AM

## 2021-07-07 NOTE — Progress Notes (Signed)
PT Cancellation Note  Patient Details Name: Alyssa [name] MRN: [id number] DOB: Apr 01, 1941   Cancelled Treatment:    Reason Eval/Treat Not Completed: Other (comment) Pt refused x 2 attempts due to fatigue and back pain.  Wyona Almas, PT, DPT Acute Rehabilitation Services Pager [phone] Office [phone]    Deno Etienne 07/07/2021, 2:43 PM

## 2021-07-08 LAB — BASIC METABOLIC PANEL
Anion gap: 10 (ref 5–15)
BUN: 31 mg/dL — ABNORMAL HIGH (ref 8–23)
CO2: 17 mmol/L — ABNORMAL LOW (ref 22–32)
Calcium: 8.5 mg/dL — ABNORMAL LOW (ref 8.9–10.3)
Chloride: 113 mmol/L — ABNORMAL HIGH (ref 98–111)
Creatinine, Ser: 2.08 mg/dL — ABNORMAL HIGH (ref 0.44–1.00)
GFR, Estimated: 24 mL/min — ABNORMAL LOW (ref >60)
Glucose, Bld: 112 mg/dL — ABNORMAL HIGH (ref 70–99)
Potassium: 5 mmol/L (ref 3.5–5.1)
Sodium: 140 mmol/L (ref 135–145)

## 2021-07-08 LAB — CBC
HCT: 23.9 % — ABNORMAL LOW (ref 36.0–46.0)
Hemoglobin: 8 g/dL — ABNORMAL LOW (ref 12.0–15.0)
MCH: 28 pg (ref 26.0–34.0)
MCHC: 33.5 g/dL (ref 30.0–36.0)
MCV: 83.6 fL (ref 80.0–100.0)
Platelets: 508 10*3/uL — ABNORMAL HIGH (ref 150–400)
RBC: 2.86 MIL/uL — ABNORMAL LOW (ref 3.87–5.11)
RDW: 18.3 % — ABNORMAL HIGH (ref 11.5–15.5)
WBC: 8.2 10*3/uL (ref 4.0–10.5)
nRBC: 0.2 % (ref 0.0–0.2)

## 2021-07-08 LAB — GLUCOSE, CAPILLARY
Glucose-Capillary: 100 mg/dL — ABNORMAL HIGH (ref 70–99)
Glucose-Capillary: 174 mg/dL — ABNORMAL HIGH (ref 70–99)
Glucose-Capillary: 104 mg/dL — ABNORMAL HIGH (ref 70–99)
Glucose-Capillary: 153 mg/dL — ABNORMAL HIGH (ref 70–99)

## 2021-07-08 NOTE — Progress Notes (Signed)
PROGRESS NOTE    Alyssa [name]  FHL:[id number] DOB: 1940-11-23 DOA: 06/15/2021 PCP: [name], DO   Chief Complaint  Patient presents with   Generalized Body Aches   Brief Narrative: 80 year old female with hypertension, HLD, COPD, PVD, CKD stage IV, COVID-19 infection in 2021, dementia presents with pain all over x3 days along with a rash.  She was seen in the ED creatinine 3.1, troponin 31 chest x-ray left lower lobe collapse COVID-19 negative CT of the chest abdomen pelvis showed left lower lobe collapse without visible mass or adenopathy and femoral bypass graft with increased fluid collection and bypass anastomosis measuring 4.8 X4.1 previously 2.6 X2.6 Patient was given valacyclovir IV fluids and admitted for further management for dyspnea due to left lung collapse, hypertension urgency, weight loss, borderline elevated troponin, nongap metabolic acidosis, anemia.  Patient was managed with Valtrex, pain control supplemental oxygen. Seen by PT OT and skilled nursing facility recommended. Pain medication adjusted for pain control ,IR consulted but unable to do nerve block. 9/12-anesthesia was able to inject ropivacaine at T11 level-with some improvement in her pain 9/13-antibiotic were changed to Zosyn -WBC count downtrending. Mild confusion-??  History of dementia may have MCI per son was not diagnosed dementia previously.  Suspecting delirium, CT head 9/14 no acute finding. Patient had fecal impaction constipation that improved with enema since then her abdomen pain is stable 9/16-started weaning down on her scheduled oxycodone, hemoglobin was low 6.5 g needing 1 unit PRBC transfusion. 9/19 patient was noted to have a rectal bleeding seen by GI-suspected and a rectal bleeding with hemorrhoids stercoral ulcer-and per GI colonoscopy would not be helpful and managed with lidocaine jelly as needed HC suppository 2.5% and resume Plavix/asa 9/20.  Subjective: Seen this morning.   Complains pain at the rash site, nursing reports that did not wake her up for pain medication overnight. Overnight patient afebrile, blood pressure 373S systolic, on room air Labs showed stable renal function slightly downtrending hemoglobin Received her prn Oxycodone x 1 dose 10 mg 9/20  Assessment & Plan:  Left lower lung collapse/chest to splinting Acute hypoxic respiratory failure 2/2 above Chronic COPD-compensated Continue pain control, bronchodilators, as needed supplemental oxygen outpatient pulmonary follow-up w/ repeat CT chest  in a month-if still not reexpanded then may need inspection bronchoscopy.At risk of developing pneumonia.  Acute Shingles/herpes zoster viral involving up to T11 dermatome(Left flank and abdomen): completed Valtrex.  Rash saline healing, no more on isolation.  Pain management as per palliative care.  Completed oral steroid 9/17. s/p rupivacaine injection at T11 on 9/12.  Continue oxycodone  5 mg Q8 HR-used as needed oxycodone x1 yesterday if not much use as needed meds we can make her oxycodone  5 mg  prn, Cont on Cymbalta, Pepcid, and Lyrica, lidocaine patch.  Mild confusion-??  History of dementia may have MCI per son was not diagnosed dementia previously.  Suspecting delirium, CT head 9/14 no acute finding.  UA grossly abnormal but already covered with antibiotics- possible UTI.  Is at baseline.  Mentation  Leukocytosis: Likely due to steroid, at risk of pneumonia 2/2 #1.  Completed 7 days of Zosyn  9/18, leukocytosis has resolved and has been afebrile.   Recent Labs  Lab 07/04/21 0311 07/05/21 0521 07/06/21 0202 07/07/21 0451 07/08/21 0342  WBC 13.9* 11.7* 10.4 8.7 8.2   Rectal bleeding 9/19 a.m: following rectal impaction/constipation needing enema several days ago. Seen by GI-suspected  rectal bleeding with hemorrhoids stercoral ulcer-and per GI colonoscopy would  not be helpful and managed with lidocaine jelly prn, HC suppository 2.5%.  No  recurrence, tolerating aspirin/Plavix.  GI signed off.  Noted hemoglobin slightly downtrending will need to monitor.    Constipation/fecal impaction: Resolved with enema.had multiple bowel movements and MiraLAX discontinued.     Hypertension urgency: BP controlled with amlodipine, Coreg, clonidine.  prn oral hydralazine.  CKD stage IV Metabolic  acidosis Hyperkalemia: previous creatinine ~2.6-2.9.ovarian function is stable, off IV fluids, potassium on higher range she will continue with Lokelma oral bicarb and monitor BMP closely  Recent Labs  Lab 07/03/21 0417 07/04/21 0311 07/05/21 0521 07/07/21 0451 07/08/21 0342  BUN 40* 38* 35* 31* 31*  CREATININE 2.73* 2.81* 2.53* 2.08* 2.08*     Recent Labs  Lab 07/03/21 0417 07/04/21 0311 07/05/21 0521 07/07/21 0451 07/08/21 0342  K 5.0 4.5 3.7 4.5 5.0    PVD- Fem fem bypass  in 2011/CT on admission showing fluid collection from pseudoaneurysm, cannot Dr. 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  Cont on  Plavix aspirin-statin.  History of tobacco abuse-has quit few months ago Moderate protein calorie malnutrition: Augment diet as tolerated-change from renal to regular diet to liberalize diet.  RD following  Deconditioning: SNF has been recommended by PT OT and is deconditioned and weak respiratory admission  HLD Cont statin  Anemia of chronic kidney disease:Hb down trended 6.5 gm ?? from hemodilution from IV fluids, s/p 1 unit PRBC ,H&H stable.  But slightly downtrending this morning.  Monitor  Recent Labs  Lab 07/06/21 1008 07/06/21 1625 07/07/21 0451 07/07/21 0922 07/08/21 0342  HGB 8.1* 7.9* 8.9* 8.9* 8.0*  HCT 24.0* 23.5* 26.1* 26.6* 23.9*  Depression with anxiety-stable mood  Goals of care: DNR, seen by palliative care.  Prognosis does not appear bright in this patient with multiple  comorbidities.  Appreciate palliative care input.  Diet Order             Diet regular Room service appropriate? Yes; Fluid consistency: Thin  Diet effective now                   Nutrition Problem: Moderate Malnutrition Etiology: chronic illness (dementia, COPD) Signs/Symptoms: mild fat depletion, moderate fat depletion, mild muscle depletion, moderate muscle depletion Interventions: Ensure Enlive (each supplement provides 350kcal and 20 grams of protein), MVI, Liberalize Diet Patient's Body mass index is 22.69 kg/m.  DVT prophylaxis: Place and maintain sequential compression device Start: 07/06/21 0201 Code Status:   Code Status: DNR  Family Communication: plan of care discussed with patient, I had updated patient's son at bedside previously.  Status is: Inpatient Remains inpatient appropriate because:Inpatient level of care appropriate due to severity of illness  Dispo:The patient is from: Home            Anticipated d/c is to: SNF.  Insurance has denied skilled nursing facility and expedited appeal in process.  Patient remains high risk for readmissions ,is still deconditioned and  very weak and will benefit with a skilled nursing facility.  Encouraged to participate with PT            Patient currently is not medically stable            Difficult to place patient No  Unresulted Labs (From admission, onward)     Start     Ordered   07/07/21 2376  Basic metabolic  panel  Daily,   R     Question:  Specimen collection method  Answer:  Lab=Lab collect   07/06/21 0738   07/07/21 0500  CBC  Daily,   R     Question:  Specimen collection method  Answer:  Lab=Lab collect   07/06/21 0738           Medications reviewed:  Scheduled Meds:  oxyCODONE  5 mg Oral Q8H   And   acetaminophen  1,000 mg Oral Q8H   amLODipine  10 mg Oral QHS   arformoterol  15 mcg Nebulization BID   And   budesonide (PULMICORT) nebulizer solution  0.25 mg Nebulization BID   And   revefenacin  175  mcg Nebulization Daily   aspirin EC  81 mg Oral Daily   capsaicin   Topical TID   carvedilol  12.5 mg Oral BID   cloNIDine  0.2 mg Oral QHS   clopidogrel  75 mg Oral Daily   donepezil  5 mg Oral Q2200   DULoxetine  40 mg Oral Daily   feeding supplement  237 mL Oral TID BM   hydrocortisone  25 mg Rectal BID   insulin aspart  0-5 Units Subcutaneous QHS   insulin aspart  0-6 Units Subcutaneous TID WC   lidocaine  2 patch Transdermal Q24H   megestrol  40 mg Oral BID   multivitamin with minerals  1 tablet Oral Daily   pantoprazole  40 mg Oral Daily   pregabalin  75 mg Oral Daily   rosuvastatin  10 mg Oral Daily   sodium bicarbonate  650 mg Oral BID   sodium chloride flush  3 mL Intravenous Q12H   sodium zirconium cyclosilicate  5 g Oral Daily   Continuous Infusions:   Consultants:see note  Procedures:see note Antimicrobials: Anti-infectives (From admission, onward)    Start     Dose/Rate Route Frequency Ordered Stop   06/30/21 1615  piperacillin-tazobactam (ZOSYN) IVPB 2.25 g        2.25 g 100 mL/hr over 30 Minutes Intravenous Every 8 hours 06/30/21 1518 07/04/21 2317   06/28/21 1230  ampicillin-sulbactam (UNASYN) 1.5 g in sodium chloride 0.9 % 100 mL IVPB  Status:  Discontinued        1.5 g 200 mL/hr over 30 Minutes Intravenous Every 12 hours 06/28/21 1127 06/30/21 1518   06/17/21 0800  valACYclovir (VALTREX) tablet 1,000 mg        1,000 mg Oral Daily 06/16/21 0839 06/22/21 0900   06/16/21 0515  valACYclovir (VALTREX) tablet 1,000 mg        1,000 mg Oral STAT 06/16/21 0502 06/16/21 0543      Culture/Microbiology    Component Value Date/Time   SDES URINE, RANDOM 09/29/2020 2353   SPECREQUEST  09/29/2020 2353    NONE Performed at [facility], [address], [place], [place] [address]    CULT 20,000 COLONIES/mL ENTEROBACTER AEROGENES (A) 09/29/2020 2353   REPTSTATUS 10/01/2020 FINAL 09/29/2020 2353    Other culture-see note  Objective: Vitals: Today's  Vitals   07/08/21 0534 07/08/21 0759 07/08/21 0802 07/08/21 0803  BP: (!) 152/84     Pulse: 78     Resp: 18     Temp: 98.5 F (36.9 C)     TempSrc: Oral     SpO2: 96% 96% 96% 96%  Weight:      Height:      PainSc:        Intake/Output Summary (Last  24 hours) at 07/08/2021 1035 Last data filed at 07/08/2021 0800 Gross per 24 hour  Intake 480 ml  Output --  Net 480 ml   Filed Weights   06/29/21 1017 06/29/21 2141 07/06/21 2129  Weight: 49.9 kg 49.9 kg 58.1 kg   Weight change:   Intake/Output from previous day: 09/20 0701 - 09/21 0700 In: 483 [P.O.:480; I.V.:3] Out: -  Intake/Output this shift: Total I/O In: 120 [P.O.:120] Out: -  Filed Weights   06/29/21 1017 06/29/21 2141 07/06/21 2129  Weight: 49.9 kg 49.9 kg 58.1 kg   Examination: General exam: AAO, not in distress, in mild pain. HEENT:Oral mucosa moist, Ear/Nose WNL grossly, dentition normal. Respiratory system: bilaterally air entry+ but slightly diminished at the left base no use of accessory muscle Cardiovascular system: S1 & S2 +, No JVD,. Gastrointestinal system: Abdomen soft, NT,ND, BS+ Nervous System:Alert, awake, moving extremities and grossly nonfocal Extremities: no edema, distal peripheral pulses palpable.  Skin: Healing  shingles rash on the left abdomen and left flank,no icterus. MSK: Normal muscle bulk,tone, power   Data Reviewed: I have personally reviewed following labs and imaging studies CBC: Recent Labs  Lab 07/04/21 0311 07/05/21 0521 07/06/21 0202 07/06/21 1008 07/06/21 1625 07/07/21 0451 07/07/21 0922 07/08/21 0342  WBC 13.9* 11.7* 10.4  --   --  8.7  --  8.2  HGB 8.4* 8.3* 8.1* 8.1* 7.9* 8.9* 8.9* 8.0*  HCT 24.1* 24.8* 23.8* 24.0* 23.5* 26.1* 26.6* 23.9*  MCV 82.0 83.8 83.5  --   --  82.3  --  83.6  PLT 459* 490* 471*  --   --  486*  --  850*   Basic Metabolic Panel: Recent Labs  Lab 07/03/21 0417 07/04/21 0311 07/05/21 0521 07/07/21 0451 07/08/21 0342  NA 140 139 142  140 140  K 5.0 4.5 3.7 4.5 5.0  CL 107 109 113* 111 113*  CO2 19* 16* 18* 18* 17*  GLUCOSE 92 101* 104* 98 112*  BUN 40* 38* 35* 31* 31*  CREATININE 2.73* 2.81* 2.53* 2.08* 2.08*  CALCIUM 8.6* 8.3* 8.6* 8.8* 8.5*   GFR: Estimated Creatinine Clearance: 18.7 mL/min (A) (by C-G formula based on SCr of 2.08 mg/dL (H)). Liver Function Tests: No results for input(s): AST, ALT, ALKPHOS, BILITOT, PROT, ALBUMIN in the last 168 hours.  No results for input(s): LIPASE, AMYLASE in the last 168 hours. No results for input(s): AMMONIA in the last 168 hours. Coagulation Profile: No results for input(s): INR, PROTIME in the last 168 hours. Cardiac Enzymes: No results for input(s): CKTOTAL, CKMB, CKMBINDEX, TROPONINI in the last 168 hours. BNP (last 3 results) No results for input(s): PROBNP in the last 8760 hours. HbA1C: No results for input(s): HGBA1C in the last 72 hours.  CBG: Recent Labs  Lab 07/07/21 0646 07/07/21 1146 07/07/21 1614 07/07/21 2215 07/08/21 0634  GLUCAP 86 131* 116* 96 100*   Lipid Profile: No results for input(s): CHOL, HDL, LDLCALC, TRIG, CHOLHDL, LDLDIRECT in the last 72 hours. Thyroid Function Tests: No results for input(s): TSH, T4TOTAL, FREET4, T3FREE, THYROIDAB in the last 72 hours. Anemia Panel: No results for input(s): VITAMINB12, FOLATE, FERRITIN, TIBC, IRON, RETICCTPCT in the last 72 hours. Sepsis Labs: No results for input(s): PROCALCITON, LATICACIDVEN in the last 168 hours.   No results found for this or any previous visit (from the past 240 hour(s)).    Radiology Studies: No results found.   LOS: 19 days   Antonieta Pert, MD [facility]  07/08/2021, 10:35  AM

## 2021-07-08 NOTE — Progress Notes (Signed)
   Daily Progress Note   [name]       Date: 07/08/2021 DOB: [birth year]  Age: 80 y.o. MRN#: [id number] Attending Physician: Antonieta Pert, MD Primary Care Physician: [name], DO Admit Date: 06/15/2021  Reason for Consultation/Follow-up: Pain control  Subjective: Chart Reviewed. Updates Received. Patient Assessed.   Alyssa [name] is in bed watching tv. No acute distress noted. Does complain of some side pain associated with her herpes zoster site. States she had pain medication earlier. She seems to be in more pain during the morning. After review it appears she is not receiving pain medications during the night which may be contributing to early morning discomfort. She shares when she does receive her medications it provides relief.  She reports having a bowel movement with no concerns. No blood was obvious in stool. Encouraged use of stool softener to prevent straining and hemorrhoids. Patient verbalized understanding.   Discussions around patient declining to work with PT on previous days. Patient states she was tired. Education provided on the importance of working with all therapies given her expressed goals is to hopefully discharge from the hospital soon Alyssa [name] verbalized understanding and plans to be prepared to work with therapy.   All questions answered and support provided.    Length of Stay: 19 days  Vital Signs: BP (!) 152/84 (BP Location: Left Arm)   Pulse 78   Temp 98.5 F (36.9 C) (Oral)   Resp 18   Ht 5\' 3"  (1.6 m)   Wt 58.1 kg   SpO2 96%   BMI 22.69 kg/m  SpO2: SpO2: 96 % O2 Device: O2 Device: Room Air O2 Flow Rate: O2 Flow Rate (L/min): 0 L/min  Physical Exam: AAO, elderly female RRR Diminished bilaterally Mood appropriate                Palliative Care Assessment & Plan    Goals of Care/Recommendations: Continue with current plan of care, pending SNF placement if approved. Recommendations for outpatient palliative  at discharge Continue with current pain regimen. Patient seems to tolerated and decreased pain if given. She is not receiving overnight which may be contributing to her morning pain/discomfort.  Would recommend re-starting Miralax daily in the setting of opioid use.  PMT will continue to support and follow.  Please reach out if any questions arise for now Palliative will follow on as needed basis please call if any questions or concerns arise.    Prognosis: Guarded  Discharge Planning: [place] for rehab with Palliative care service follow-up  Thank you for allowing the Palliative Medicine Team to assist in the care of this patient.  Time Total: 40 min.   Visit consisted of counseling and education dealing with the complex and emotionally intense issues of symptom management and palliative care in the setting of serious and potentially life-threatening illness.Greater than 50%  of this time was spent counseling and coordinating care related to the above assessment and plan.  [name], AGPCNP-BC  Palliative Medicine Team [phone]

## 2021-07-08 NOTE — Progress Notes (Signed)
Nutrition Follow-up  DOCUMENTATION CODES:   Underweight, Non-severe (moderate) malnutrition in context of chronic illness  INTERVENTION:   -Continue Ensure Enlive po BTD, each supplement provides 350 kcal and 20 grams of protein  -Continue MVI with minerals daily -Magic cup BID with meals, each supplement provides 290 kcal and 9 grams of protein  -Continue liberalized diet of regular   NUTRITION DIAGNOSIS:   Moderate Malnutrition related to chronic illness (dementia, COPD) as evidenced by mild fat depletion, moderate fat depletion, mild muscle depletion, moderate muscle depletion.  Ongoing  GOAL:   Patient will meet greater than or equal to 90% of their needs  Progressing   MONITOR:   PO intake, Supplement acceptance, Labs, Weight trends, Skin, I & O's  REASON FOR ASSESSMENT:   Malnutrition Screening Tool    ASSESSMENT:   Alyssa [name] is a 80 y.o. female with medical history significant of hypertension, hyperlipidemia, COPD, PVD, CKD stage stage IV, COVID-19 infection 2021, dementia presents with complaints of pain all over for the last 3 days.  Admits that a rash broke out on the left side of her abdomen and goes around to her back and states that it is very painful.  She states that she has been more short of breath and has had a productive cough with " cloudy" sputum production.  Associated symptoms include wheezing, chest pain, left hip pain, 15 pound weight loss over the last month, and generalized malaise.  Notes that she has not had an appetite.  Denies having any significant fever, nausea, or vomiting.  Patient has not tried anything to treat symptoms at home prior to coming to the hospital.  She reports quitting smoking about 1 month ago.  Records note patient has been having difficulty with shortness of breath for more than an months now  Reviewed I/O's: +483 ml x 24 hours and +7.6 L since 06/24/21  Pt sleeping soundly in recliner chair, did not respond to  voice.   Pt remains with poor oral intake. Noted meal completions 25%. Pt is consuming about 2 Ensure Enlive supplements daily.    Per TOC notes, plan to d/c to SNF once medically stable.  Palliative care following; plan for outpatient palliative care follow-up.   Medications reviewed and include megace and lokelma.   Labs reviewed: CBGS: 96-174 (inpatient orders for glycemic control are 0-5 units insulin aspart daily at bedtime and 0-6 units insulin aspart TID with meals).    Diet Order:   Diet Order             Diet regular Room service appropriate? Yes; Fluid consistency: Thin  Diet effective now                   EDUCATION NEEDS:   Education needs have been addressed  Skin:  Skin Assessment: Skin Integrity Issues: Skin Integrity Issues:: Other (Comment) Other: rash and blisters on lower abdomen  Last BM:  07/07/21  Height:   Ht Readings from Last 1 Encounters:  06/16/21 5\' 3"  (1.6 m)    Weight:   Wt Readings from Last 1 Encounters:  07/06/21 58.1 kg    Ideal Body Weight:  52.3 kg  BMI:  Body mass index is 22.69 kg/m.  Estimated Nutritional Needs:   Kcal:  1600-1800  Protein:  90-105 grams  Fluid:  > 1.6 L    Loistine Chance, RD, LDN, [place] Registered Dietitian II Certified Diabetes Care and Education Specialist Please refer to [facility] for RD and/or RD on-call/weekend/after  hours pager

## 2021-07-08 NOTE — Progress Notes (Signed)
Physical Therapy Treatment Patient Details Name: Alyssa [name] MRN: [id number] DOB: Oct 30, 1940 Today's Date: 07/08/2021   History of Present Illness 80 y/o female admitted 8/29  secondary to worsening shortness of breath, rash in the left flank/abdomen that progressed to the back, productive cough and weight loss. Found to have shingles, hypoxic respiratory failure and hypertensive urgency. PMH inclues PVD, COPD, HTN, and covid.    PT Comments    Pt progressing towards goals. Was able to stand X3 for short periods for clean up following incontinence. Following 3rd stand, was able to transfer to chair using RW. Required min A and use of RW. Current recommendations for SNF appropriate. Will continue to follow acutely.     Recommendations for follow up therapy are one component of a multi-disciplinary discharge planning process, led by the attending physician.  Recommendations may be updated based on patient status, additional functional criteria and insurance authorization.  Follow Up Recommendations  SNF     Equipment Recommendations  Wheelchair (measurements PT);Wheelchair cushion (measurements PT)    Recommendations for Other Services       Precautions / Restrictions Precautions Precautions: Fall Restrictions Weight Bearing Restrictions: No     Mobility  Bed Mobility Overal bed mobility: Needs Assistance Bed Mobility: Supine to Sit     Supine to sit: Min assist     General bed mobility comments: Min A for trunk elevation to come to sitting. Increased time required.    Transfers Overall transfer level: Needs assistance Equipment used: Rolling walker (2 wheeled) Transfers: Sit to/from [facility] Sit to Stand: Min assist Stand pivot transfers: Min assist       General transfer comment: Required min A to stand X3 for clean up following stool and urine incontinence. Pt fatiguing easily and unable to maintain static standing for prolonged  periods. Min A to take steps towards chair.  Ambulation/Gait                 Stairs             Wheelchair Mobility    Modified Rankin (Stroke Patients Only)       Balance Overall balance assessment: Needs assistance Sitting-balance support: No upper extremity supported;Feet supported Sitting balance-Leahy Scale: Fair     Standing balance support: Bilateral upper extremity supported Standing balance-Leahy Scale: Poor Standing balance comment: Reliant on UE and external support                            Cognition Arousal/Alertness: Awake/alert Behavior During Therapy: WFL for tasks assessed/performed Overall Cognitive Status: Impaired/Different from baseline Area of Impairment: Awareness;Safety/judgement                         Safety/Judgement: Decreased awareness of safety;Decreased awareness of deficits Awareness: Emergent          Exercises      General Comments        Pertinent Vitals/Pain Pain Assessment: Faces Faces Pain Scale: Hurts even more Pain Location: back R hip Pain Descriptors / Indicators: Grimacing Pain Intervention(s): Limited activity within patient's tolerance;Monitored during session;Repositioned    Home Living                      Prior Function            PT Goals (current goals can now be found in the care plan section) Acute Rehab PT Goals  Patient Stated Goal: feel better PT Goal Formulation: With patient Time For Goal Achievement: 07/17/21 Potential to Achieve Goals: Fair Progress towards PT goals: Progressing toward goals    Frequency    Min 2X/week      PT Plan Current plan remains appropriate    Co-evaluation              AM-PAC PT "6 Clicks" Mobility   Outcome Measure  Help needed turning from your back to your side while in a flat bed without using bedrails?: A Little Help needed moving from lying on your back to sitting on the side of a flat bed without  using bedrails?: A Little Help needed moving to and from a bed to a chair (including a wheelchair)?: A Little Help needed standing up from a chair using your arms (e.g., wheelchair or bedside chair)?: A Little Help needed to walk in hospital room?: Total Help needed climbing 3-5 steps with a railing? : Total 6 Click Score: 14    End of Session   Activity Tolerance: Patient limited by fatigue Patient left: in chair;with call bell/phone within reach;with chair alarm set Nurse Communication: Mobility status PT Visit Diagnosis: Muscle weakness (generalized) (M62.81);Other abnormalities of gait and mobility (R26.89) Pain - part of body:  (back)     Time: 1430-1447 PT Time Calculation (min) (ACUTE ONLY): 17 min  Charges:  $Therapeutic Activity: 8-22 mins                     Lou Miner, DPT  Acute Rehabilitation Services  Pager: [phone] Office: [phone]    Rudean Hitt 07/08/2021, 3:41 PM

## 2021-07-09 LAB — BASIC METABOLIC PANEL
Anion gap: 8 (ref 5–15)
BUN: 32 mg/dL — ABNORMAL HIGH (ref 8–23)
CO2: 19 mmol/L — ABNORMAL LOW (ref 22–32)
Calcium: 8.6 mg/dL — ABNORMAL LOW (ref 8.9–10.3)
Chloride: 114 mmol/L — ABNORMAL HIGH (ref 98–111)
Creatinine, Ser: 1.93 mg/dL — ABNORMAL HIGH (ref 0.44–1.00)
GFR, Estimated: 26 mL/min — ABNORMAL LOW (ref >60)
Glucose, Bld: 112 mg/dL — ABNORMAL HIGH (ref 70–99)
Potassium: 4.8 mmol/L (ref 3.5–5.1)
Sodium: 141 mmol/L (ref 135–145)

## 2021-07-09 LAB — CBC
HCT: 23.3 % — ABNORMAL LOW (ref 36.0–46.0)
Hemoglobin: 7.9 g/dL — ABNORMAL LOW (ref 12.0–15.0)
MCH: 28.2 pg (ref 26.0–34.0)
MCHC: 33.9 g/dL (ref 30.0–36.0)
MCV: 83.2 fL (ref 80.0–100.0)
Platelets: 520 10*3/uL — ABNORMAL HIGH (ref 150–400)
RBC: 2.8 MIL/uL — ABNORMAL LOW (ref 3.87–5.11)
RDW: 18.6 % — ABNORMAL HIGH (ref 11.5–15.5)
WBC: 8.3 10*3/uL (ref 4.0–10.5)
nRBC: 0.2 % (ref 0.0–0.2)

## 2021-07-09 LAB — GLUCOSE, CAPILLARY
Glucose-Capillary: 109 mg/dL — ABNORMAL HIGH (ref 70–99)
Glucose-Capillary: 95 mg/dL (ref 70–99)
Glucose-Capillary: 209 mg/dL — ABNORMAL HIGH (ref 70–99)
Glucose-Capillary: 79 mg/dL (ref 70–99)

## 2021-07-09 MED ORDER — DOCUSATE SODIUM 100 MG PO CAPS
100.0000 mg | ORAL_CAPSULE | Freq: Every day | ORAL | Status: DC
  Administered 2021-07-09 – 2021-07-10 (×2): 100 mg via ORAL
  Filled 2021-07-09 (×2): qty 1

## 2021-07-09 NOTE — Progress Notes (Signed)
OT TREATMENT Pt. Was seen to increase pt. Ability to care for herself. Pt. Was ed on use of AE for LE dressing. Pt. Stated she knew how to use but she was not able to use correctly and required instruction on how to use AE. Pt. Was able to sit eob for adls without lob. Pt. Was Min A with transfer to recliner. Acute OT to follow.     07/09/21 1200  OT Visit Information  Last OT Received On 07/09/21  Assistance Needed +1  History of Present Illness 80 y/o female admitted 8/29  secondary to worsening shortness of breath, rash in the left flank/abdomen that progressed to the back, productive cough and weight loss. Found to have shingles, hypoxic respiratory failure and hypertensive urgency. PMH inclues PVD, COPD, HTN, and covid.  Precautions  Precautions Fall  Pain Assessment  Pain Assessment 0-10  Pain Score 9  Pain Location back R hip  Pain Descriptors / Indicators Aching  Pain Intervention(s) RN gave pain meds during session;Patient requesting pain meds-RN notified  Cognition  Arousal/Alertness Awake/alert  Behavior During Therapy WFL for tasks assessed/performed  Overall Cognitive Status Impaired/Different from baseline  Area of Impairment Awareness;Safety/judgement  Safety/Judgement Decreased awareness of safety;Decreased awareness of deficits  Upper Extremity Assessment  Upper Extremity Assessment Generalized weakness  Lower Extremity Assessment  Lower Extremity Assessment Defer to PT evaluation  ADL  Overall ADL's  Needs assistance/impaired  Eating/Feeding Set up;Sitting  Grooming Wash/dry hands;Wash/dry face;Oral care;Set up;Sitting  Upper Body Bathing Set up;Supervision/ safety;Sitting  Lower Body Bathing Moderate assistance;Sit to/from stand  Upper Body Dressing  Minimal assistance;Sitting  Lower Body Dressing Minimal assistance;With adaptive equipment;Sit to/from Retail buyer Minimal assistance;BSC;RW  Toileting- Water quality scientist and Hygiene Sit to/from  stand;Moderate assistance  Functional mobility during ADLs Minimal assistance;Rolling walker  General ADL Comments Pt. sat eob for ADLs and was ed on use of AE and was able to return demo with min cues.  Bed Mobility  Overal bed mobility Needs Assistance  Bed Mobility Supine to Sit  Rolling Min guard  Balance  Sitting balance-Leahy Scale Good  Standing balance-Leahy Scale Fair  Restrictions  Weight Bearing Restrictions No  Vision- Assessment  Vision Assessment? No apparent visual deficits  Transfers  Overall transfer level Needs assistance  Equipment used Rolling walker (2 wheeled)  Transfers Sit to/from [employer] Transfers  Sit to Stand Min assist  Stand pivot transfers Min assist  General transfer comment cues for proper hand placement/  OT - End of Session  Equipment Utilized During Treatment Rolling walker  Activity Tolerance Patient limited by pain  Patient left in chair;with call bell/phone within reach;with nursing/sitter in room (nurse stated she did not need chair alarm)  OT Assessment/Plan  OT Plan Discharge plan remains appropriate  OT Visit Diagnosis Unsteadiness on feet (R26.81);Muscle weakness (generalized) (M62.81);Pain  OT Frequency (ACUTE ONLY) Min 2X/week  Follow Up Recommendations SNF  OT Equipment 3 in 1 bedside commode;Wheelchair (measurements OT);Wheelchair cushion (measurements OT)  AM-PAC OT "6 Clicks" Daily Activity Outcome Measure (Version 2)  Help from another person eating meals? 4  Help from another person taking care of personal grooming? 3  Help from another person toileting, which includes using toliet, bedpan, or urinal? 3  Help from another person bathing (including washing, rinsing, drying)? 2  Help from another person to put on and taking off regular upper body clothing? 3  Help from another person to put on and taking off regular lower body clothing? 3  6  Click Score 18  Progressive Mobility  What is the highest level of mobility  based on the progressive mobility assessment? Level 3 (Stands with assist) - Balance while standing  and cannot march in place  Mobility Out of bed to chair with meals  OT Goal Progression  Progress towards OT goals Progressing toward goals  Acute Rehab OT Goals  Patient Stated Goal to have less pain  OT Goal Formulation With patient  Time For Goal Achievement 07/18/21  Potential to Achieve Goals Good  ADL Goals  Pt Will Perform Grooming with set-up;sitting;standing  Pt Will Perform Lower Body Dressing with set-up;sit to/from stand  Pt Will Transfer to Toilet with modified independence;ambulating;regular height toilet  Pt/caregiver will Perform Home Exercise Program Increased strength;Both right and left upper extremity;With theraband;With Supervision;With written HEP provided  OT Time Calculation  OT Start Time (ACUTE ONLY) 1243  OT Stop Time (ACUTE ONLY) 1312  OT Time Calculation (min) 29 min  OT General Charges  $OT Visit 1 Visit  OT Treatments  $Self Care/Home Management  23-37 mins   Reece Packer OT/L

## 2021-07-09 NOTE — Plan of Care (Signed)
  Problem: Health Behavior/Discharge Planning: Goal: Ability to manage health-related needs will improve Outcome: Progressing   Problem: Nutrition: Goal: Adequate nutrition will be maintained Outcome: Progressing   Problem: Pain Managment: Goal: General experience of comfort will improve Outcome: Progressing   

## 2021-07-09 NOTE — Progress Notes (Signed)
PROGRESS NOTE    Alyssa [name]  LFY:[id number] DOB: 19-Jan-1941 DOA: 06/15/2021 PCP: [name], DO   Chief Complaint  Patient presents with   Generalized Body Aches   Brief Narrative: 80 year old female with hypertension, HLD, COPD, PVD, CKD stage IV, COVID-19 infection in 2021, dementia presents with pain all over x3 days along with a rash.  She was seen in the ED creatinine 3.1, troponin 31 chest x-ray left lower lobe collapse COVID-19 negative CT of the chest abdomen pelvis showed left lower lobe collapse without visible mass or adenopathy and femoral bypass graft with increased fluid collection and bypass anastomosis measuring 4.8 X4.1 previously 2.6 X2.6 Patient was given valacyclovir IV fluids and admitted for further management for dyspnea due to left lung collapse, hypertension urgency, weight loss, borderline elevated troponin, nongap metabolic acidosis, anemia.  Patient was managed with Valtrex, pain control supplemental oxygen. Seen by PT OT and skilled nursing facility recommended. Pain medication adjusted for pain control ,IR consulted but unable to do nerve block. 9/12-anesthesia was able to inject ropivacaine at T11 level-with some improvement in her pain 9/13-antibiotic were changed to Zosyn -WBC count downtrending. Mild confusion-??  History of dementia may have MCI per son was not diagnosed dementia previously.  Suspecting delirium, CT head 9/14 no acute finding. Patient had fecal impaction constipation that improved with enema since then her abdomen pain is stable 9/16-started weaning down on her scheduled oxycodone, hemoglobin was low 6.5 g needing 1 unit PRBC transfusion. 9/19 patient was noted to have a rectal bleeding seen by GI-suspected and a rectal bleeding with hemorrhoids stercoral ulcer-and per GI colonoscopy would not be helpful and managed with lidocaine jelly as needed HC suppository 2.5% and resume Plavix/asa 9/20.  Subjective: Seen and examined this  morning woke up on entering the room, alert awake oriented at baseline no complaints pain is controlled on oral regimen Overnight no fever.  Creatinine further down 1.9 hemoglobin 7.9 BM x2 9/21  Assessment & Plan:  Left lower lung collapse/chest to splinting Acute hypoxic respiratory failure 2/2 above Chronic COPD-compensated Continue pain control, bronchodilators, as needed supplemental oxygen outpatient pulmonary follow-up w/ repeat CT chest  in a month-if still not reexpanded then may need inspection bronchoscopy.At risk of developing pneumonia.  Acute Shingles/herpes zoster viral involving up to T11 dermatome(Left flank and abdomen): completed Valtrex.  Rash saline healing, no more on isolation.  Pain management as per palliative care.  Completed oral steroid 9/17. s/p rupivacaine injection at T11 on 9/12 following which pain significantly better.  Continue oxycodone  5 mg Q8 HR-we will continue to wean down as tolerated, peviously was on 10 mg.  On oxycodone  5 mg  prn,Cymbalta, Pepcid, and Lyrica, lidocaine patch.  Mild confusion-??  History of dementia may have MCI per son was not diagnosed dementia previously.  Suspecting delirium, CT head 9/14 no acute finding.  UA grossly abnormal but already covered with antibiotics- possible UTI.  Is at baseline.  Mentation  Leukocytosis: Likely due to steroid, at risk of pneumonia 2/2 #1.  Completed 7 days of Zosyn  9/18, leukocytosis has resolved and has been afebrile.   Recent Labs  Lab 07/05/21 0521 07/06/21 0202 07/07/21 0451 07/08/21 0342 07/09/21 0411  WBC 11.7* 10.4 8.7 8.2 8.3    Rectal bleeding 9/19 a.m: following rectal impaction/constipation needing enema several days ago. Seen by GI-suspected  rectal bleeding with hemorrhoids stercoral ulcer-and per GI colonoscopy would not be helpful and managed with lidocaine jelly prn, HC suppository 2.5%.  No recurrence, tolerating aspirin/Plavix.  GI signed off.  Overall stable.  Monitor  closely.  Constipation/fecal impaction: Resolved with enema.holding off on MiraLAX having bowel movement.  We will add a stool softener    Hypertension urgency: Stable continue amlodipine, Coreg, clonidine.  prn oral hydralazine.  CKD stage IV Metabolic  acidosis Hyperkalemia: previous creatinine ~2.6-2.9.renal function improved and stable. off IV fluids, potassium on higher range she will continue with Lokelma oral bicarb and monitor BMP closely  Recent Labs  Lab 07/04/21 0311 07/05/21 0521 07/07/21 0451 07/08/21 0342 07/09/21 0411  BUN 38* 35* 31* 31* 32*  CREATININE 2.81* 2.53* 2.08* 2.08* 1.93*      Recent Labs  Lab 07/04/21 0311 07/05/21 0521 07/07/21 0451 07/08/21 0342 07/09/21 0411  K 4.5 3.7 4.5 5.0 4.8     PVD- Fem fem bypass  in 2011/CT on admission showing fluid collection from pseudoaneurysm, cannot Dr. 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  Cont on  Plavix aspirin-statin.  History of tobacco abuse-has quit few months ago Moderate protein calorie malnutrition: Augment diet as tolerated-change from renal to regular diet to liberalize diet.  RD following  Deconditioning: SNF has been recommended by PT OT and is deconditioned and weak respiratory admission  HLD Cont statin  Anemia of chronic kidney disease:Hb down trended 6.5 gm ?? from hemodilution from IV fluids, s/p 1 unit PRBC ,H&H stable.  But slightly downtrending this morning.  Monitor  Recent Labs  Lab 07/06/21 1625 07/07/21 0451 07/07/21 0922 07/08/21 0342 07/09/21 0411  HGB 7.9* 8.9* 8.9* 8.0* 7.9*  HCT 23.5* 26.1* 26.6* 23.9* 23.3*   Depression with anxiety-stable mood  Goals of care: DNR, seen by palliative care.  Prognosis does not appear bright in this patient with multiple comorbidities.  Appreciate palliative care input.  Diet Order              Diet regular Room service appropriate? Yes; Fluid consistency: Thin  Diet effective now                   Nutrition Problem: Moderate Malnutrition Etiology: chronic illness (dementia, COPD) Signs/Symptoms: mild fat depletion, moderate fat depletion, mild muscle depletion, moderate muscle depletion Interventions: Ensure Enlive (each supplement provides 350kcal and 20 grams of protein), MVI, Liberalize Diet Patient's Body mass index is 22.69 kg/m.  DVT prophylaxis: Place and maintain sequential compression device Start: 07/06/21 0201 Code Status:   Code Status: DNR  Family Communication: plan of care discussed with patient, I had updated patient's son at bedside previously.  Status is: Inpatient Remains inpatient appropriate because:Inpatient level of care appropriate due to severity of illness  Dispo:The patient is from: Home            Anticipated d/c is to: SNF.  Insurance has denied skilled nursing facility and expedited appeal in process.  Patient remains high risk for readmissions ,is still deconditioned and  very weak and will benefit with a skilled nursing facility.  Encouraged to participate with PT            Patient currently is not medically stable            Difficult to place patient No  Unresulted Labs (From admission, onward)    None      Medications reviewed:  Scheduled Meds:  oxyCODONE  5 mg Oral Q8H   And  acetaminophen  1,000 mg Oral Q8H   amLODipine  10 mg Oral QHS   arformoterol  15 mcg Nebulization BID   And   budesonide (PULMICORT) nebulizer solution  0.25 mg Nebulization BID   And   revefenacin  175 mcg Nebulization Daily   aspirin EC  81 mg Oral Daily   capsaicin   Topical TID   carvedilol  12.5 mg Oral BID   cloNIDine  0.2 mg Oral QHS   clopidogrel  75 mg Oral Daily   donepezil  5 mg Oral Q2200   DULoxetine  40 mg Oral Daily   feeding supplement  237 mL Oral TID BM   hydrocortisone  25 mg Rectal BID   insulin aspart  0-5 Units Subcutaneous  QHS   insulin aspart  0-6 Units Subcutaneous TID WC   lidocaine  2 patch Transdermal Q24H   megestrol  40 mg Oral BID   multivitamin with minerals  1 tablet Oral Daily   pantoprazole  40 mg Oral Daily   pregabalin  75 mg Oral Daily   rosuvastatin  10 mg Oral Daily   sodium bicarbonate  650 mg Oral BID   sodium chloride flush  3 mL Intravenous Q12H   sodium zirconium cyclosilicate  5 g Oral Daily   Continuous Infusions:   Consultants:see note  Procedures:see note Antimicrobials: Anti-infectives (From admission, onward)    Start     Dose/Rate Route Frequency Ordered Stop   06/30/21 1615  piperacillin-tazobactam (ZOSYN) IVPB 2.25 g        2.25 g 100 mL/hr over 30 Minutes Intravenous Every 8 hours 06/30/21 1518 07/04/21 2317   06/28/21 1230  ampicillin-sulbactam (UNASYN) 1.5 g in sodium chloride 0.9 % 100 mL IVPB  Status:  Discontinued        1.5 g 200 mL/hr over 30 Minutes Intravenous Every 12 hours 06/28/21 1127 06/30/21 1518   06/17/21 0800  valACYclovir (VALTREX) tablet 1,000 mg        1,000 mg Oral Daily 06/16/21 0839 06/22/21 0900   06/16/21 0515  valACYclovir (VALTREX) tablet 1,000 mg        1,000 mg Oral STAT 06/16/21 0502 06/16/21 0543      Culture/Microbiology    Component Value Date/Time   SDES URINE, RANDOM 09/29/2020 2353   SPECREQUEST  09/29/2020 2353    NONE Performed at [facility], [place] [address], [place], [place] [address]    CULT 20,000 COLONIES/mL ENTEROBACTER AEROGENES (A) 09/29/2020 2353   REPTSTATUS 10/01/2020 FINAL 09/29/2020 2353    Other culture-see note  Objective: Vitals: Today's Vitals   07/09/21 0538 07/09/21 0735 07/09/21 0737 07/09/21 0738  BP: (!) 159/91     Pulse: 80     Resp:      Temp: 98 F (36.7 C)     TempSrc: Oral     SpO2: 97% 98% 95% 96%  Weight:      Height:      PainSc:        Intake/Output Summary (Last 24 hours) at 07/09/2021 0802 Last data filed at 07/08/2021 2120 Gross per 24 hour  Intake 357 ml   Output 0 ml  Net 357 ml    Filed Weights   06/29/21 1017 06/29/21 2141 07/06/21 2129  Weight: 49.9 kg 49.9 kg 58.1 kg   Weight change:   Intake/Output from previous day: 09/21 0701 - 09/22 0700 In: 477 [P.O.:477] Out: 0  Intake/Output this shift: No intake/output data recorded. Filed Weights  06/29/21 1017 06/29/21 2141 07/06/21 2129  Weight: 49.9 kg 49.9 kg 58.1 kg   Examination: General exam: AAOx 3, older than stated age, weak appearing. HEENT:Oral mucosa moist, Ear/Nose WNL grossly, dentition normal. Respiratory system: bilaterally air entry present slightly diminished at the left base, no use of accessory muscle Cardiovascular system: S1 & S2 +, No JVD,. Gastrointestinal system: Abdomen soft,NT,ND, BS+ Nervous System:Alert, awake, moving extremities and grossly nonfocal Extremities: Mild leg edema, distal peripheral pulses palpable.  Skin: Healed shingles rash on the left anterior abdomen and left flank  MSK: Normal muscle bulk,tone, power   Data Reviewed: I have personally reviewed following labs and imaging studies CBC: Recent Labs  Lab 07/05/21 0521 07/06/21 0202 07/06/21 1008 07/06/21 1625 07/07/21 0451 07/07/21 0922 07/08/21 0342 07/09/21 0411  WBC 11.7* 10.4  --   --  8.7  --  8.2 8.3  HGB 8.3* 8.1*   < > 7.9* 8.9* 8.9* 8.0* 7.9*  HCT 24.8* 23.8*   < > 23.5* 26.1* 26.6* 23.9* 23.3*  MCV 83.8 83.5  --   --  82.3  --  83.6 83.2  PLT 490* 471*  --   --  486*  --  508* 520*   < > = values in this interval not displayed.    Basic Metabolic Panel: Recent Labs  Lab 07/04/21 0311 07/05/21 0521 07/07/21 0451 07/08/21 0342 07/09/21 0411  NA 139 142 140 140 141  K 4.5 3.7 4.5 5.0 4.8  CL 109 113* 111 113* 114*  CO2 16* 18* 18* 17* 19*  GLUCOSE 101* 104* 98 112* 112*  BUN 38* 35* 31* 31* 32*  CREATININE 2.81* 2.53* 2.08* 2.08* 1.93*  CALCIUM 8.3* 8.6* 8.8* 8.5* 8.6*    GFR: Estimated Creatinine Clearance: 20.2 mL/min (A) (by C-G formula based on  SCr of 1.93 mg/dL (H)). Liver Function Tests: No results for input(s): AST, ALT, ALKPHOS, BILITOT, PROT, ALBUMIN in the last 168 hours.  No results for input(s): LIPASE, AMYLASE in the last 168 hours. No results for input(s): AMMONIA in the last 168 hours. Coagulation Profile: No results for input(s): INR, PROTIME in the last 168 hours. Cardiac Enzymes: No results for input(s): CKTOTAL, CKMB, CKMBINDEX, TROPONINI in the last 168 hours. BNP (last 3 results) No results for input(s): PROBNP in the last 8760 hours. HbA1C: No results for input(s): HGBA1C in the last 72 hours.  CBG: Recent Labs  Lab 07/08/21 0634 07/08/21 1210 07/08/21 1621 07/08/21 2119 07/09/21 0534  GLUCAP 100* 174* 104* 153* 109*    Lipid Profile: No results for input(s): CHOL, HDL, LDLCALC, TRIG, CHOLHDL, LDLDIRECT in the last 72 hours. Thyroid Function Tests: No results for input(s): TSH, T4TOTAL, FREET4, T3FREE, THYROIDAB in the last 72 hours. Anemia Panel: No results for input(s): VITAMINB12, FOLATE, FERRITIN, TIBC, IRON, RETICCTPCT in the last 72 hours. Sepsis Labs: No results for input(s): PROCALCITON, LATICACIDVEN in the last 168 hours.   No results found for this or any previous visit (from the past 240 hour(s)).    Radiology Studies: No results found.   LOS: 20 days   Antonieta Pert, MD [facility]  07/09/2021, 8:02 AM

## 2021-07-10 LAB — RESP PANEL BY RT-PCR (FLU A&B, COVID) ARPGX2
Influenza A by PCR: NEGATIVE
Influenza B by PCR: NEGATIVE
SARS Coronavirus 2 by RT PCR: NEGATIVE

## 2021-07-10 LAB — GLUCOSE, CAPILLARY
Glucose-Capillary: 113 mg/dL — ABNORMAL HIGH (ref 70–99)
Glucose-Capillary: 116 mg/dL — ABNORMAL HIGH (ref 70–99)

## 2021-07-10 MED ORDER — PREGABALIN 75 MG PO CAPS: 75.0000 mg | ORAL_CAPSULE | Freq: Every day | ORAL | 0 refills | Status: DC

## 2021-07-10 MED ORDER — AMLODIPINE BESYLATE 10 MG PO TABS: 10.0000 mg | ORAL_TABLET | Freq: Every day | ORAL | Status: DC

## 2021-07-10 MED ORDER — OXYCODONE HCL 5 MG PO TABS: 5.0000 mg | ORAL_TABLET | Freq: Three times a day (TID) | ORAL | 0 refills | Status: DC | PRN

## 2021-07-10 MED ORDER — DOCUSATE SODIUM 100 MG PO CAPS: 100.0000 mg | ORAL_CAPSULE | Freq: Every day | ORAL | 0 refills | Status: DC

## 2021-07-10 MED ORDER — DULOXETINE HCL 40 MG PO CPEP: 40.0000 mg | ORAL_CAPSULE | Freq: Every day | ORAL | 3 refills | Status: DC

## 2021-07-10 MED ORDER — LIDOCAINE 5 % EX PTCH: 2.0000 | MEDICATED_PATCH | CUTANEOUS | 0 refills | Status: DC

## 2021-07-10 MED ORDER — SODIUM ZIRCONIUM CYCLOSILICATE 5 G PO PACK: 5.0000 g | PACK | Freq: Every day | ORAL | Status: DC

## 2021-07-10 MED ORDER — HYDROCORTISONE ACETATE 25 MG RE SUPP: 25.0000 mg | Freq: Two times a day (BID) | RECTAL | 0 refills | Status: DC

## 2021-07-10 NOTE — TOC Transition Note (Signed)
Transition of Care [facility]) - CM/SW Discharge Note   Patient Details  Name: Alyssa [name] MRN: [id number] Date of Birth: [birth year]  [facility] CM/SW Contact:  Sable Feil, LCSW Phone Number: 07/10/2021, 5:37 PM   Clinical Narrative:  Patient discharged to [facility] for [place] rehab. Discharge clinicals transmitted to skilled nursing facility and non-emergency ambulance transport arranged. [name] contacted and informed of discharge.    Final next level of care: [facility]) Barriers to Discharge: Barriers Resolved   Patient Goals and CMS Choice Patient states their goals for this hospitalization and ongoing recovery are:: Patient agreeable to ST rehab at SNF before returning home CMS Medicare.gov Compare Post Acute Care list provided to:: Patient Choice offered to / list presented to : Patient  Discharge Placement   Existing PASRR number confirmed : 06/19/21          Patient chooses bed at: [facility] Patient to be transferred to facility by: Non-0emergency ambulance transport Name of family member notified: Son [name] Patient and family notified of of transfer: 07/10/21  Discharge Plan and Services In-house Referral: Clinical Social Work                                   Social Determinants of Health ([place] Interventions  No SDOH interventions requested or needed at discharge.   Readmission Risk Interventions Readmission Risk Prevention Plan 07/10/2020 12/26/2018  Transportation Screening Complete Complete  Medication Review Press photographer) Complete Complete  PCP or Specialist appointment within 3-5 days of discharge - Not Complete  PCP/Specialist Appt Not Complete comments - pt for snf. Snf MD to see as needed.  [place] or Home Care Consult Complete Not Complete  HRI or Home Care Consult Pt Refusal Comments - NA  SW Recovery Care/Counseling Consult  Complete Not Complete  SW Consult Not Complete Comments - NA  Palliative Care Screening Not Applicable Complete  Skilled Nursing Facility Not Applicable Complete  Some recent data might be hidden

## 2021-07-10 NOTE — Discharge Summary (Signed)
Physician Discharge Summary  Alyssa [name] CWC:[id number] DOB: [birth year] DOA: 06/15/2021  PCP: [name], DO  Admit date: 06/15/2021 Discharge date: 07/10/2021  Admitted From: [facility] Disposition:  [facility]  Recommendations for Outpatient Follow-up:  Follow up with PCP in 1-2 weeks Please obtain BMP/CBC in one week Please follow up on the following pending results:  Home Health:no  Equipment/Devices: none  Discharge Condition: Stable Code Status:   Code Status: DNR Diet recommendation:  Diet Order             Diet regular Room service appropriate? Yes; Fluid consistency: Thin  Diet effective now                    Brief/Interim Summary: 80 year old female with hypertension, HLD, COPD, PVD, CKD stage IV, COVID-19 infection in 2021, dementia presents with pain all over x3 days along with a rash.  She was seen in the ED creatinine 3.1, troponin 31 chest x-ray left lower lobe collapse COVID-19 negative CT of the chest abdomen pelvis showed left lower lobe collapse without visible mass or adenopathy and femoral bypass graft with increased fluid collection and bypass anastomosis measuring 4.8 X4.1 previously 2.6 X2.6 Patient was given valacyclovir IV fluids and admitted for further management for dyspnea due to left lung collapse, hypertension urgency, weight loss, borderline elevated troponin, nongap metabolic acidosis, anemia.   Patient was managed with Valtrex, pain control supplemental oxygen. Seen by PT OT and skilled nursing facility recommended. Pain medication adjusted for pain control ,IR consulted but unable to do nerve block. 9/12-anesthesia was able to inject ropivacaine at T11 level-with some improvement in her pain 9/13-antibiotic were changed to Zosyn -WBC count downtrending. Mild confusion-??  History of dementia may have MCI per son was not diagnosed dementia previously.  Suspecting delirium, CT head 9/14 no acute finding. Patient had fecal impaction  constipation that improved with enema since then her abdomen pain is stable 9/16-started weaning down on her scheduled oxycodone, hemoglobin was low 6.5 g needing 1 unit PRBC transfusion. 9/19 patient was noted to have a rectal bleeding seen by GI-suspected and a rectal bleeding with hemorrhoids stercoral ulcer-and per GI colonoscopy would not be helpful and managed with lidocaine jelly as needed HC suppository 2.5% and resume Plavix/asa 9/20. Patient at this time remained stable afebrile on room air, kidney function stable with CKD creatinine 1.9 hemoglobin 7.9.  Patient Was denied SNF level of care at [facility] but she is going back to long-term care at [facility].    Discharge Diagnoses:  Left lower lung collapse/chest to splinting Acute hypoxic respiratory failure 2/2 above Chronic COPD-compensated Continue pain control, bronchodilators, as needed supplemental oxygen outpatient pulmonary follow-up w/ repeat CT chest  in a month-if still not reexpanded then may need inspection bronchoscopy.At risk of developing pneumonia.  Acute Shingles/herpes zoster viral involving up to T11 dermatome(Left flank and abdomen): completed Valtrex.  Rash saline healing, no more on isolation.  Pain management as per palliative care.  Completed oral steroid 9/17. s/p rupivacaine injection at T11 on 9/12 following which pain significantly better.  Continue oxycodone  5 mg Q8 HR-we will continue to wean down as tolerated, peviously was on 10 mg.  Changed to as needed oxygen on discharge continue Cymbalta, Pepcid, and Lyrica, lidocaine patch.  Mild confusion-??  History of dementia may have MCI per son was not diagnosed dementia previously.  Suspecting delirium, CT head 9/14 no acute finding.  UA grossly abnormal but already covered with antibiotics- possible  UTI.  Is at baseline.  Mentation  Leukocytosis: Likely due to steroid, at risk of pneumonia 2/2 #1.  Completed 7 days of Zosyn  9/18, leukocytosis has resolved and  has been afebrile.   Recent Labs  Lab 07/05/21 0521 07/06/21 0202 07/07/21 0451 07/08/21 0342 07/09/21 0411  WBC 11.7* 10.4 8.7 8.2 8.3   Rectal bleeding 9/19 a.m: following rectal impaction/constipation needing enema several days ago. Seen by GI-suspected  rectal bleeding with hemorrhoids stercoral ulcer-and per GI colonoscopy would not be helpful and managed with lidocaine jelly prn, HC suppository 2.5%.  No recurrence, tolerating aspirin/Plavix.  GI signed off.  Overall stable.  Monitor closely.  Constipation/fecal impaction: Resolved with enema.holding off on MiraLAX having bowel movement.  We will add a stool softener    Hypertension urgency: Stable continue amlodipine, Coreg, clonidine.  prn oral hydralazine.  CKD stage IV Metabolic  acidosis Hyperkalemia: previous creatinine ~2.6-2.9.renal function improved and stable. off IV fluids, potassium on higher range she will continue with Lokelma oral bicarb and monitor BMP closely  Recent Labs  Lab 07/04/21 0311 07/05/21 0521 07/07/21 0451 07/08/21 0342 07/09/21 0411  BUN 38* 35* 31* 31* 32*  CREATININE 2.81* 2.53* 2.08* 2.08* 1.93*     Recent Labs  Lab 07/04/21 0311 07/05/21 0521 07/07/21 0451 07/08/21 0342 07/09/21 0411  K 4.5 3.7 4.5 5.0 4.8    PVD- Fem fem bypass  in 2011/CT on admission showing fluid collection from pseudoaneurysm, cannot Dr. 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  Cont on  Plavix aspirin-statin.  History of tobacco abuse-has quit few months ago Moderate protein calorie malnutrition: Augment diet as tolerated-change from renal to regular diet to liberalize diet.  RD following  Deconditioning: SNF has been recommended by PT OT and is deconditioned and weak respiratory admission  HLD Cont statin  Anemia of chronic kidney disease:Hb down trended 6.5 gm ?? from  hemodilution from IV fluids, s/p 1 unit PRBC ,H&H stable.  But slightly downtrending this morning.  Monitor  Recent Labs  Lab 07/06/21 1625 07/07/21 0451 07/07/21 0922 07/08/21 0342 07/09/21 0411  HGB 7.9* 8.9* 8.9* 8.0* 7.9*  HCT 23.5* 26.1* 26.6* 23.9* 23.3*  Depression with anxiety-stable mood  Goals of care: DNR, seen by palliative care.  Prognosis does not appear bright in this patient with multiple comorbidities.  Appreciate palliative care input. Nutrition Problem: Moderate Malnutrition Etiology: chronic illness (dementia, COPD) Signs/Symptoms: mild fat depletion, moderate fat depletion, mild muscle depletion, moderate muscle depletion Interventions: Ensure Enlive (each supplement provides 350kcal and 20 grams of protein), MVI, Liberalize Diet Patient's Body mass index is 22.69 kg/m.   Consults: PCCM Anesthesiology Gastroenterologu   Subjective: Alert awake, oriented at baseline, pain is controlled.  Discharge Exam: Vitals:   07/10/21 0748 07/10/21 0749  BP:    Pulse:    Resp:    Temp:    SpO2: 97% 98%   General: Pt is alert, awake, not in acute distress Cardiovascular: RRR, S1/S2 +, no rubs, no gallops Respiratory: CTA bilaterally, no wheezing, no rhonchi Abdominal: Soft, NT, ND, bowel sounds + Extremities: no edema, no cyanosis  Discharge Instructions  Discharge Instructions     Discharge instructions   Complete by: As directed    Follow-up with pulmonary doctor for repeat imaging if your x-ray and further management of left lower lobe collapse  You will need to follow-up with  vascular doctor regarding pseudoaneurysm which will need repair please call Dr. Luan Pulling office  Check CBC BMP in 1 week at the facility  Please call call MD or return to ER for similar or worsening recurring problem that brought you to hospital or if any fever,nausea/vomiting,abdominal pain, uncontrolled pain, chest pain,  shortness of breath or any other alarming  symptoms.  Please follow-up your doctor as instructed in a week time and call the office for appointment.  Please avoid alcohol, smoking, or any other illicit substance and maintain healthy habits including taking your regular medications as prescribed.  You were cared for by a hospitalist during your hospital stay. If you have any questions about your discharge medications or the care you received while you were in the hospital after you are discharged, you can call the unit and ask to speak with the hospitalist on call if the hospitalist that took care of you is not available.  Once you are discharged, your primary care physician will handle any further medical issues. Please note that NO REFILLS for any discharge medications will be authorized once you are discharged, as it is imperative that you return to your primary care physician (or establish a relationship with a primary care physician if you do not have one) for your aftercare needs so that they can reassess your need for medications and monitor your lab values   Increase activity slowly   Complete by: As directed    No wound care   Complete by: As directed       Allergies as of 07/10/2021   No Known Allergies      Medication List     STOP taking these medications    gabapentin 100 MG capsule Commonly known as: NEURONTIN   methocarbamol 500 MG tablet Commonly known as: ROBAXIN       TAKE these medications    acetaminophen 325 MG tablet Commonly known as: TYLENOL Take 2 tablets (650 mg total) by mouth every 6 (six) hours as needed for mild pain, moderate pain or fever (or Fever >/= 101).   albuterol 108 (90 Base) MCG/ACT inhaler Commonly known as: VENTOLIN HFA Inhale 2 puffs into the lungs every 4 (four) hours as needed for wheezing or shortness of breath.   amLODipine 10 MG tablet Commonly known as: NORVASC Take 1 tablet (10 mg total) by mouth at bedtime. What changed:  medication strength how much to take    aspirin 81 MG EC tablet Take 1 tablet (81 mg total) by mouth daily.   Breztri Aerosphere 160-9-4.8 MCG/ACT Aero Generic drug: Budeson-Glycopyrrol-Formoterol Inhale 2 puffs into the lungs 2 (two) times daily.   carvedilol 12.5 MG tablet Commonly known as: COREG Take 12.5 mg by mouth 2 (two) times daily.   cloNIDine 0.2 MG tablet Commonly known as: CATAPRES Take 0.2 mg by mouth at bedtime.   clopidogrel 75 MG tablet Commonly known as: PLAVIX Take 1 tablet (75 mg total) by mouth daily.   diclofenac Sodium 1 % Gel Commonly known as: VOLTAREN Apply 2 g topically 4 (four) times daily as needed (pain).   dicyclomine 20 MG tablet Commonly known as: BENTYL Take 20 mg by mouth 3 (three) times daily.   docusate sodium 100 MG capsule Commonly known as: COLACE Take 1 capsule (100 mg total) by mouth daily. Start taking on: July 11, 2021   donepezil 5 MG tablet Commonly known as: ARICEPT Take 1 tablet (5 mg total) by mouth daily.   DULoxetine HCl 40 MG  Cpep Take 40 mg by mouth daily. Start taking on: July 11, 2021 What changed:  medication strength how much to take   feeding supplement Liqd Take 237 mLs by mouth 2 (two) times daily between meals.   hydrocortisone 25 MG suppository Commonly known as: ANUSOL-HC Place 1 suppository (25 mg total) rectally 2 (two) times daily.   hydrOXYzine 25 MG tablet Commonly known as: ATARAX/VISTARIL Take 25 mg by mouth at bedtime as needed (sleep).   Iron High-Potency 325 MG Tabs Take 325 mg by mouth daily with breakfast.   lidocaine 5 % Commonly known as: LIDODERM Place 2 patches onto the skin daily. Remove & Discard patch within 12 hours or as directed by MD   magnesium oxide 400 MG tablet Commonly known as: MAG-OX Take 400 mg by mouth 2 (two) times daily.   megestrol 40 MG tablet Commonly known as: MEGACE Take 40 mg by mouth 2 (two) times daily.   multivitamin with minerals Tabs tablet Take 1 tablet by mouth  daily.   nitroGLYCERIN 0.4 MG SL tablet Commonly known as: NITROSTAT Place 1 tablet (0.4 mg total) under the tongue every 5 (five) minutes as needed for chest pain.   ondansetron 4 MG disintegrating tablet Commonly known as: Zofran ODT Take 1 tablet (4 mg total) by mouth every 4 (four) hours as needed for nausea or vomiting.   oxyCODONE 5 MG immediate release tablet Commonly known as: Oxy IR/ROXICODONE Take 1 tablet (5 mg total) by mouth every 8 (eight) hours as needed for up to 10 doses for moderate pain.   pantoprazole 40 MG tablet Commonly known as: PROTONIX Take 1 tablet (40 mg total) by mouth daily.   pregabalin 75 MG capsule Commonly known as: LYRICA Take 1 capsule (75 mg total) by mouth daily. Start taking on: July 11, 2021   rosuvastatin 40 MG tablet Commonly known as: CRESTOR Take 1 tablet (40 mg total) by mouth daily.   sodium bicarbonate 650 MG tablet Take 2 tablets (1,300 mg total) by mouth 2 (two) times daily. What changed:  how much to take when to take this   sodium zirconium cyclosilicate 5 g packet Commonly known as: LOKELMA Take 5 g by mouth daily.   Vitamin D (Ergocalciferol) 1.25 MG (50000 UNIT) Caps capsule Commonly known as: DRISDOL Take 50,000 Units by mouth every Monday.        Follow-up Information     [name], DO .   Specialty: General Practice Contact information: [place] [address] [phone]         Collene Gobble, MD Follow up in 3 week(s).   Specialty: Pulmonary Disease Why: Call office for follow-up xray/scan of the chest and further management Contact information: [place] [address] [phone]         [name], DO Follow up in 1 week(s).   Specialty: General Practice Contact information: [place] [address] [phone]                No Known Allergies  The results of significant diagnostics from this hospitalization  (including imaging, microbiology, ancillary and laboratory) are listed below for reference.    Microbiology: No results found for this or any previous visit (from the past 240 hour(s)).  Procedures/Studies: CT ABDOMEN PELVIS WO CONTRAST  Result Date: 06/16/2021 CLINICAL DATA:  Abdominal pain. EXAM: CT ABDOMEN AND PELVIS WITHOUT CONTRAST TECHNIQUE: Multidetector CT imaging of the abdomen and pelvis was performed  following the standard protocol without IV contrast. COMPARISON:  CT of the abdomen pelvis dated 07/06/2020. FINDINGS: Evaluation of this exam is limited in the absence of intravenous contrast. Lower chest: There is consolidative changes of the visualized left lower lobe with volume loss consistent with complete atelectasis. A centrally occlusive lesion is not excluded. Chest CT may provide better evaluation if there is clinical concern for and occlusive mass. Coronary vascular calcification. Intra-abdominal free air or free fluid. Hepatobiliary: The liver is unremarkable. No intrahepatic biliary ductal dilatation. The gallbladder is unremarkable. Pancreas: Moderate atrophy of the pancreas. Spleen: Normal in size without focal abnormality. Adrenals/Urinary Tract: The adrenal glands are unremarkable moderate right renal parenchyma atrophy. Faint small hypodense lesions in the left kidney are not characterized on this CT. There is no hydronephrosis or nephrolithiasis on either side. The visualized ureters and urinary bladder appear unremarkable. Stomach/Bowel: There is moderate size hiatal hernia. There is sigmoid diverticulosis without active inflammatory changes. There is no bowel obstruction or active inflammation. The appendix is normal. Vascular/Lymphatic: Advanced aortoiliac atherosclerotic disease. The IVC is grossly unremarkable. No pain venous gas. There is no adenopathy. A femoral femoral bypass graft is noted. There has been interval increase in the size of the fluid collection or  pseudoaneurysm in the bypass anastomosis in the right groin measuring 4.8 x 4.1 cm (previously 2.6 x 2.6 cm). Evaluation of the vasculature is limited in the absence of contrast. Reproductive: Calcified uterine fibroid. Other: None Musculoskeletal: Osteopenia with degenerative changes of the spine. Lower lumbar fusion and right hip arthroplasty. No acute osseous pathology. IMPRESSION: 1. No acute intra-abdominal or pelvic pathology. 2. Sigmoid diverticulosis. No bowel obstruction. Normal appendix. 3. Interval increase in the size of the fluid collection or pseudoaneurysm in the bypass anastomosis in the right groin. 4. Complete atelectasis of the visualized left lower lobe. A centrally occlusive lesion is not excluded. Chest CT may provide better evaluation if there is clinical concern for and occlusive mass. 5. Moderate size hiatal hernia. 6. Aortic Atherosclerosis (ICD10-I70.0). Electronically Signed   By: Anner Crete M.D.   On: 06/16/2021 01:19   DG Chest 2 View  Result Date: 06/15/2021 CLINICAL DATA:  Chest pain with difficulty breathing and abdominal pain. Subjective fever. EXAM: CHEST - 2 VIEW COMPARISON:  Radiographs 12/05/2020 and 07/06/2020.  CT 01/02/2020. FINDINGS: Evidence of new left lower lobe collapse with volume loss in the left hemithorax and mediastinal shift to the left. The right lung appears clear. The heart size and mediastinal contours are stable with diffuse aortic tortuosity. There may be a small left pleural effusion. The bones appear unchanged. There are degenerative changes in the spine with evidence of chronic rotator cuff tear on the right. IMPRESSION: Left lower lobe collapse which could be secondary to central obstructing tumor or pneumonia. If there is clinical evidence for pneumonia, suggest short-term radiographic follow-up after appropriate antibiotic therapy. If more definitive evaluation is clinically warranted at this time, consider chest CT with contrast.  Electronically Signed   By: Richardean Sale M.D.   On: 06/15/2021 18:47   DG Abd 1 View  Result Date: 07/02/2021 CLINICAL DATA:  Constipation EXAM: ABDOMEN - 1 VIEW COMPARISON:  12/10/2018 FINDINGS: Nonobstructed bowel-gas pattern with mild to moderate stool. Radiopaque material in the rectosigmoid colon. Hardware in the lumbar spine and right hip. Chronic osseous deformity of the right hip. Probable calcified uterine fibroids IMPRESSION: Nonobstructed gas pattern with mild to moderate stool in the colon Electronically Signed   By: Donavan Foil  M.D.   On: 07/02/2021 17:19   CT HEAD WO CONTRAST (5MM)  Result Date: 07/02/2021 CLINICAL DATA:  Delirium EXAM: CT HEAD WITHOUT CONTRAST TECHNIQUE: Contiguous axial images were obtained from the base of the skull through the vertex without intravenous contrast. COMPARISON:  09/29/2020 FINDINGS: Brain: There is no mass, hemorrhage or extra-axial collection. There is generalized atrophy without lobar predilection. Hypodensity of the white matter is most commonly associated with chronic microvascular disease. Old left basal ganglia small vessel infarct. Vascular: No abnormal hyperdensity of the major intracranial arteries or dural venous sinuses. No intracranial atherosclerosis. Skull: The visualized skull base, calvarium and extracranial soft tissues are normal. Sinuses/Orbits: Postsurgical changes of right maxillary sinus with mild mucosal thickening. The orbits are normal. IMPRESSION: 1. No acute intracranial abnormality. 2. Generalized atrophy and chronic microvascular ischemia. Electronically Signed   By: Ulyses Jarred M.D.   On: 07/02/2021 01:32   CT Chest Wo Contrast  Result Date: 06/16/2021 CLINICAL DATA:  Minor chest trauma EXAM: CT CHEST WITHOUT CONTRAST TECHNIQUE: Multidetector CT imaging of the chest was performed following the standard protocol without IV contrast. COMPARISON:  01/02/2020 FINDINGS: Cardiovascular: Normal heart size. No pericardial  effusion. Atheromatous calcification of the aorta and coronaries. Extensive atherosclerosis of the great vessels. Mediastinum/Nodes: Small or moderate sliding hiatal hernia. No adenopathy. Lungs/Pleura: Left lower lobe collapse with low-density lower lobe airway filling. Centrilobular emphysema. Two small pulmonary nodules in the right lower lobe, stable and considered benign. Upper Abdomen: No acute finding. Partially covered right kidney showing atrophy. Musculoskeletal: Diffuse degenerative disc collapse. T10-11 interval disc collapse with sclerosis and endplate cysts/erosions. Advanced biforaminal impingement at this level. No soft tissue calcification or visible paravertebral edema. IMPRESSION: 1. Left lower lobe collapse. Associated airways are low-density with no visible mass or adenopathy. Recommend radiographic follow-up to clearing. 2. T10-11 advanced disc disease since 2021. No visible paravertebral inflammation typical of acute discitis, please correlate with labs. There is advanced biforaminal impingement at this level. 3. Aortic Atherosclerosis (ICD10-I70.0) and Emphysema (ICD10-J43.9). Electronically Signed   By: Monte Fantasia M.D.   On: 06/16/2021 05:29   DG Chest Port 1 View  Result Date: 06/26/2021 CLINICAL DATA:  Collapse of left lung. EXAM: PORTABLE CHEST 1 VIEW COMPARISON:  Chest radiograph 06/15/2021 chest CT 06/16/2021 FINDINGS: Single view of the chest again demonstrates volume loss at the left lung base. Increased aeration or air bronchograms at the left lung base. Slightly coarse lung markings compatible with underlying emphysema. Heart size is grossly stable. Atherosclerotic calcifications at the aortic arch. Again noted is elevation of the right humeral head suggestive for underlying rotator cuff disease. Difficult to exclude left pleural fluid. IMPRESSION: Persistent volume loss at left lung base. However, there appears to be slightly improved aeration, presumably in the left lower  lobe. Question left pleural fluid. Electronically Signed   By: Markus Daft M.D.   On: 06/26/2021 07:43    Labs: BNP (last 3 results) No results for input(s): BNP in the last 8760 hours. Basic Metabolic Panel: Recent Labs  Lab 07/04/21 0311 07/05/21 0521 07/07/21 0451 07/08/21 0342 07/09/21 0411  NA 139 142 140 140 141  K 4.5 3.7 4.5 5.0 4.8  CL 109 113* 111 113* 114*  CO2 16* 18* 18* 17* 19*  GLUCOSE 101* 104* 98 112* 112*  BUN 38* 35* 31* 31* 32*  CREATININE 2.81* 2.53* 2.08* 2.08* 1.93*  CALCIUM 8.3* 8.6* 8.8* 8.5* 8.6*   Liver Function Tests: No results for input(s): AST, ALT, ALKPHOS, BILITOT,  PROT, ALBUMIN in the last 168 hours. No results for input(s): LIPASE, AMYLASE in the last 168 hours. No results for input(s): AMMONIA in the last 168 hours. CBC: Recent Labs  Lab 07/05/21 0521 07/06/21 0202 07/06/21 1008 07/06/21 1625 07/07/21 0451 07/07/21 0922 07/08/21 0342 07/09/21 0411  WBC 11.7* 10.4  --   --  8.7  --  8.2 8.3  HGB 8.3* 8.1*   < > 7.9* 8.9* 8.9* 8.0* 7.9*  HCT 24.8* 23.8*   < > 23.5* 26.1* 26.6* 23.9* 23.3*  MCV 83.8 83.5  --   --  82.3  --  83.6 83.2  PLT 490* 471*  --   --  486*  --  508* 520*   < > = values in this interval not displayed.   Cardiac Enzymes: No results for input(s): CKTOTAL, CKMB, CKMBINDEX, TROPONINI in the last 168 hours. BNP: Invalid input(s): POCBNP CBG: Recent Labs  Lab 07/09/21 0534 07/09/21 1125 07/09/21 1701 07/09/21 2053 07/10/21 0649  GLUCAP 109* 95 209* 79 113*   D-Dimer No results for input(s): DDIMER in the last 72 hours. Hgb A1c No results for input(s): HGBA1C in the last 72 hours. Lipid Profile No results for input(s): CHOL, HDL, LDLCALC, TRIG, CHOLHDL, LDLDIRECT in the last 72 hours. Thyroid function studies No results for input(s): TSH, T4TOTAL, T3FREE, THYROIDAB in the last 72 hours.  Invalid input(s): FREET3 Anemia work up No results for input(s): VITAMINB12, FOLATE, FERRITIN, TIBC, IRON,  RETICCTPCT in the last 72 hours. Urinalysis    Component Value Date/Time   COLORURINE YELLOW 07/02/2021 0230   APPEARANCEUR TURBID (A) 07/02/2021 0230   LABSPEC 1.017 07/02/2021 0230   PHURINE 8.0 07/02/2021 0230   GLUCOSEU NEGATIVE 07/02/2021 0230   HGBUR MODERATE (A) 07/02/2021 0230   BILIRUBINUR NEGATIVE 07/02/2021 0230   KETONESUR NEGATIVE 07/02/2021 0230   PROTEINUR 100 (A) 07/02/2021 0230   UROBILINOGEN 0.2 08/24/2015 1737   NITRITE NEGATIVE 07/02/2021 0230   LEUKOCYTESUR LARGE (A) 07/02/2021 0230   Sepsis Labs Invalid input(s): PROCALCITONIN,  WBC,  LACTICIDVEN Microbiology No results found for this or any previous visit (from the past 240 hour(s)).   Time coordinating discharge: 35 minutes  SIGNED: Antonieta Pert, MD  [facility] 07/10/2021, 10:03 AM  If 7PM-7AM, please contact night-coverage [email or web address]

## 2021-07-10 NOTE — Progress Notes (Signed)
Called report to [facility]. Spoke to nurse [employer]. No questions at this time

## 2021-07-11 IMAGING — DX DG CHEST 1V PORT
1 series · 1 of 1 positions shown · non-contrast
Comparison: 03/14/2020

CLINICAL DATA: Shortness of breath, COVID positive contact

EXAM:
PORTABLE CHEST 1 VIEW

[chest]
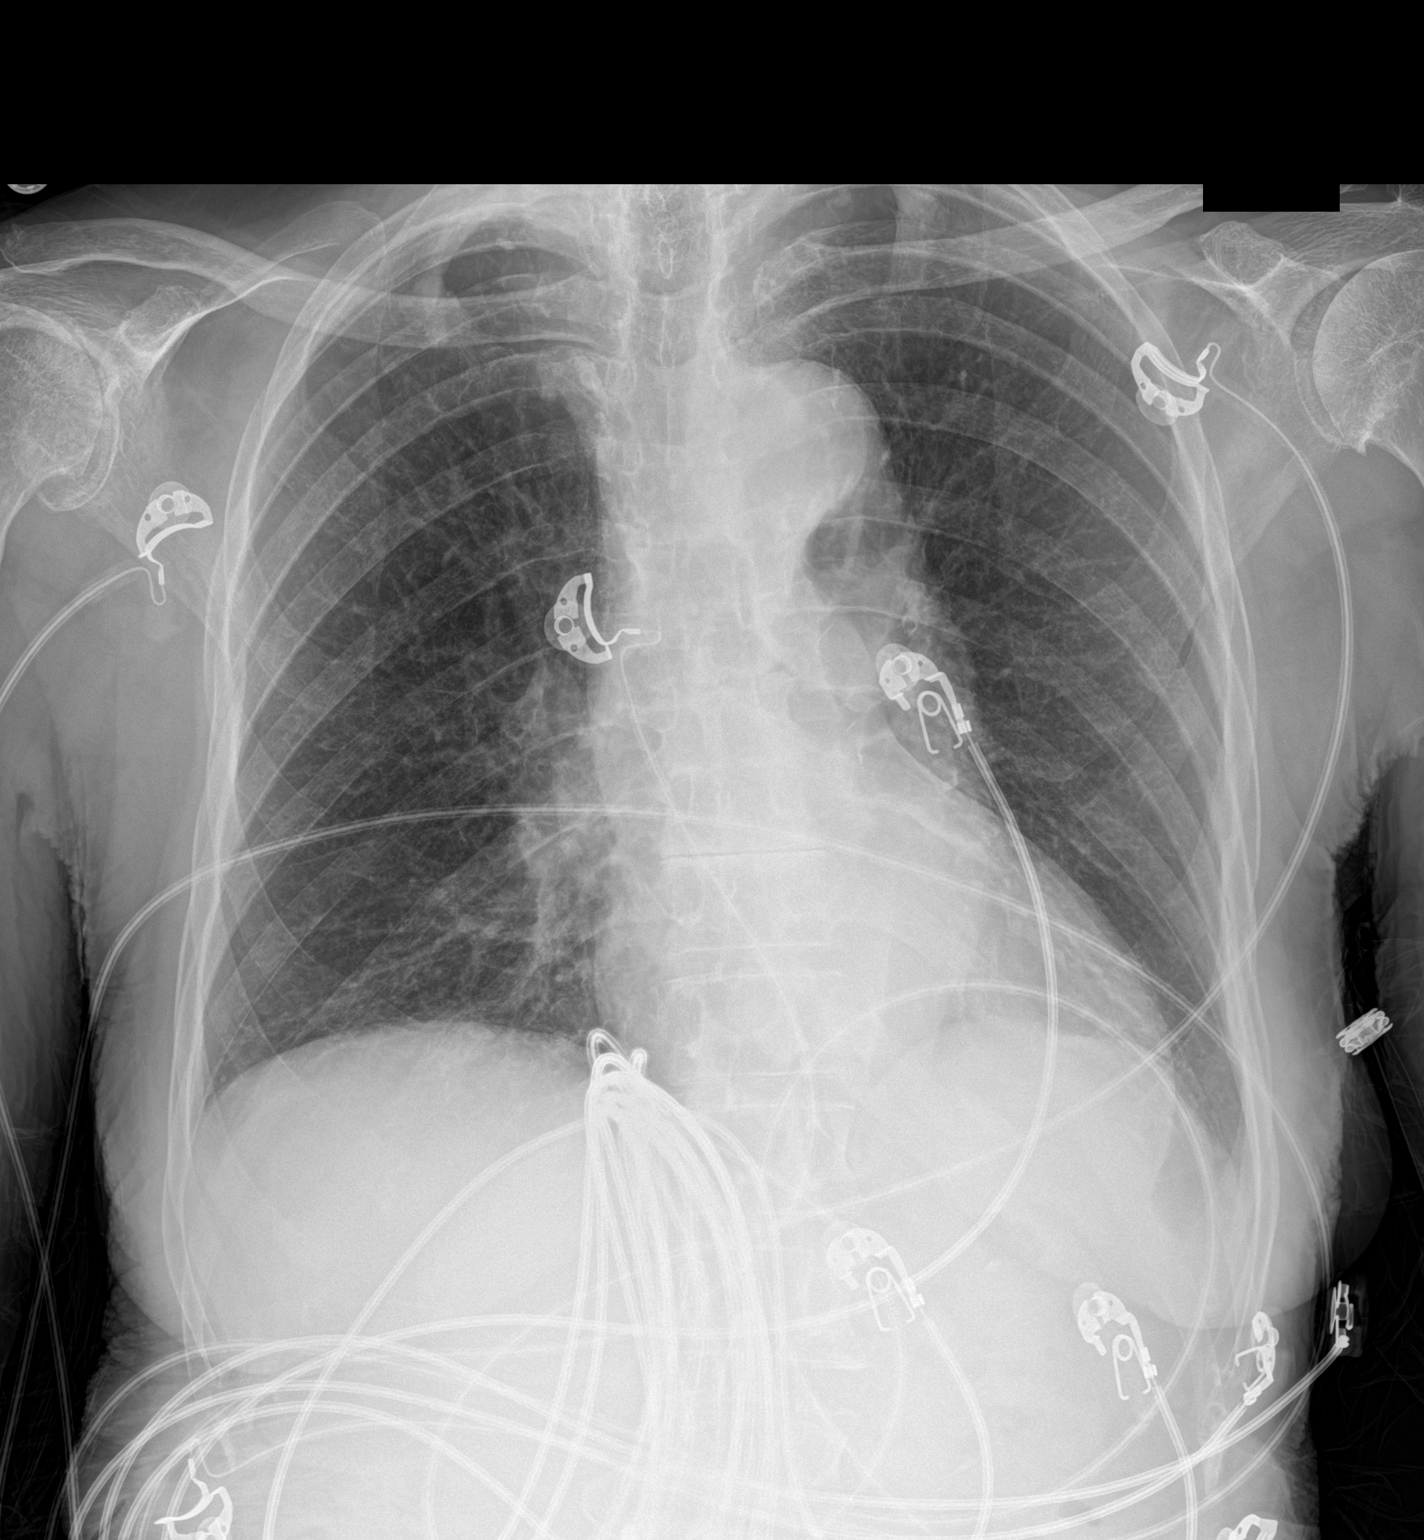

[1 of 1 positions shown; findings below may reference images not displayed]

FINDINGS: No new consolidation or edema. Chronic interstitial prominence. No
pleural effusion or pneumothorax. Stable cardiomediastinal contours
with normal heart size.
IMPRESSION: No acute process in the chest.

## 2021-07-11 IMAGING — CT CT ABD-PELV W/O CM
2 of 4 series · 15 of 46 positions shown, 17 images · non-contrast
Comparison: May 02, 2020 and November 01, 2019

CLINICAL DATA: Shortness of breath, chest pain and abdominal pain.

EXAM:
CT ABDOMEN AND PELVIS WITHOUT CONTRAST
TECHNIQUE: Multidetector CT imaging of the abdomen and pelvis was performed
following the standard protocol without IV contrast.

[Series 3: ap without · axial · non-contrast · 0.56mm/px · z∈[+906,+1216]mm · 12 of 72 slices shown, 14 images]
[im 5/72  soft-tissue]
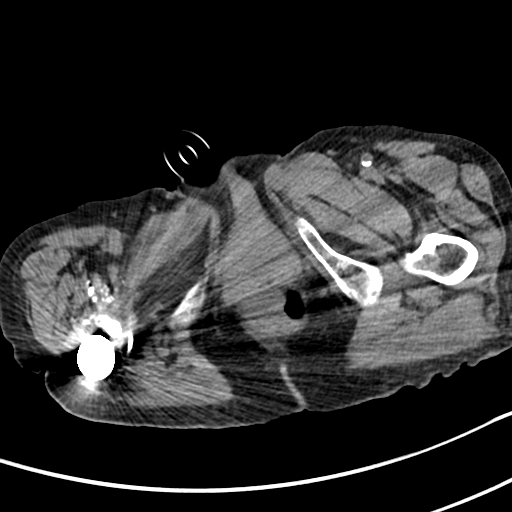
[im 5/72  bone]
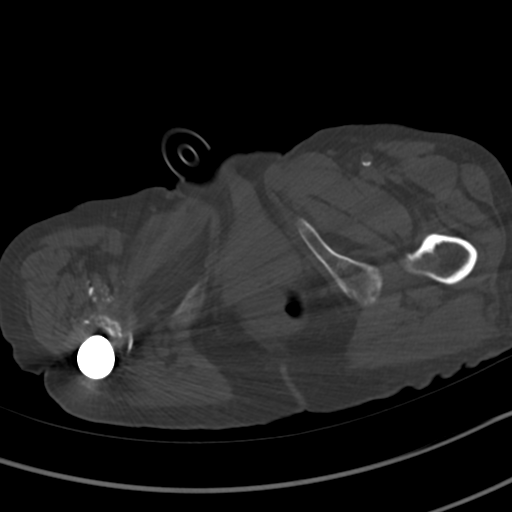
[im 9/72  soft-tissue]
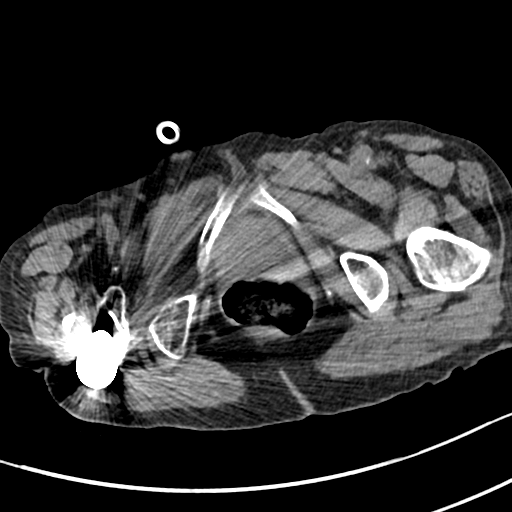
[im 18/72  soft-tissue]
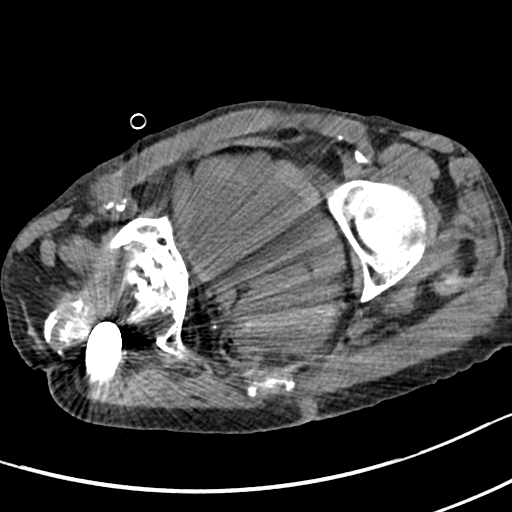
[im 23/72  soft-tissue]
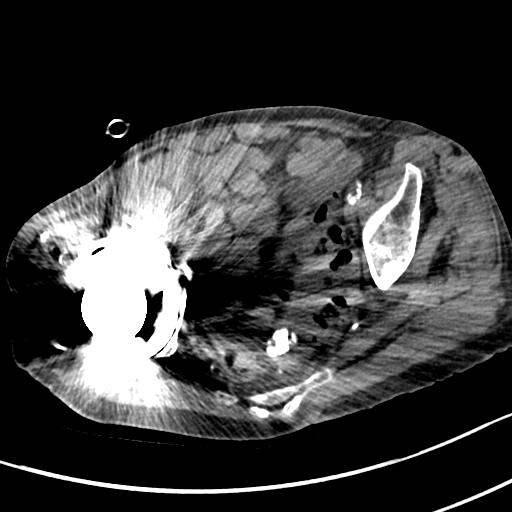
[im 27/72  soft-tissue]
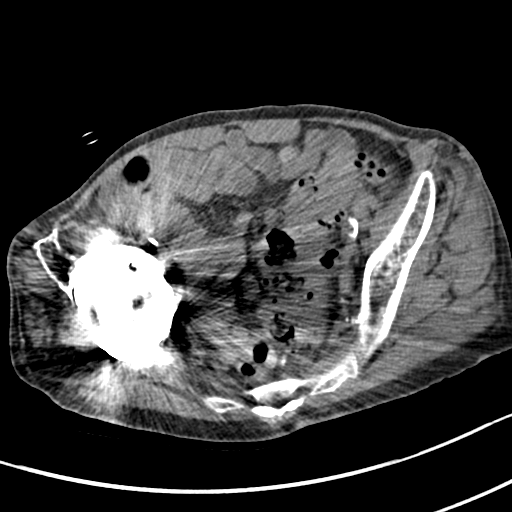
[im 32/72  soft-tissue]
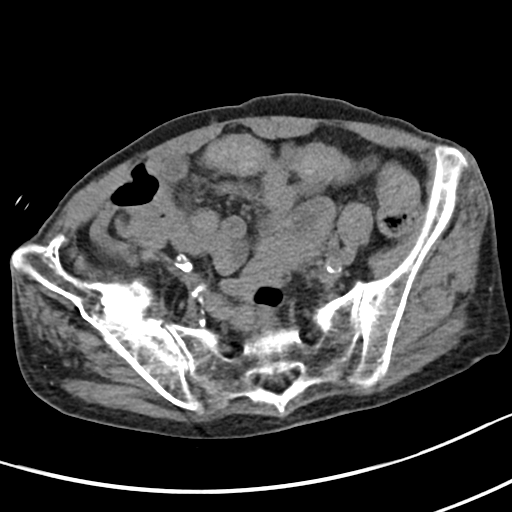
[im 40/72  soft-tissue]
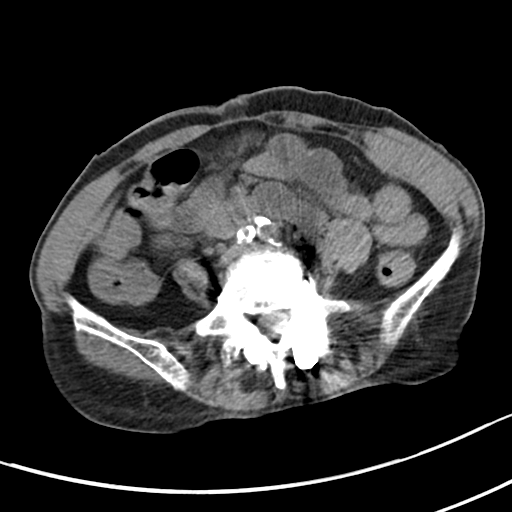
[im 45/72  soft-tissue]
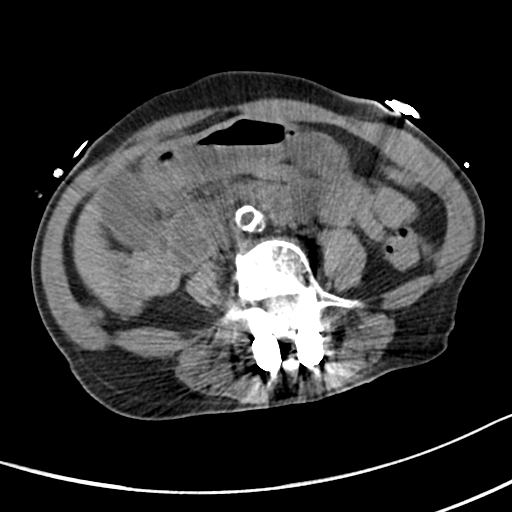
[im 49/72  soft-tissue]
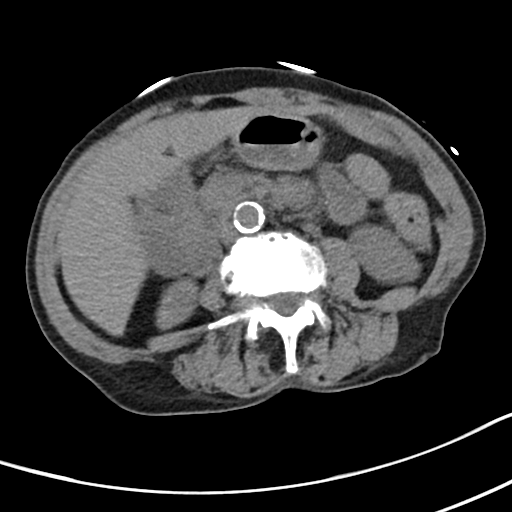
[im 49/72  bone]
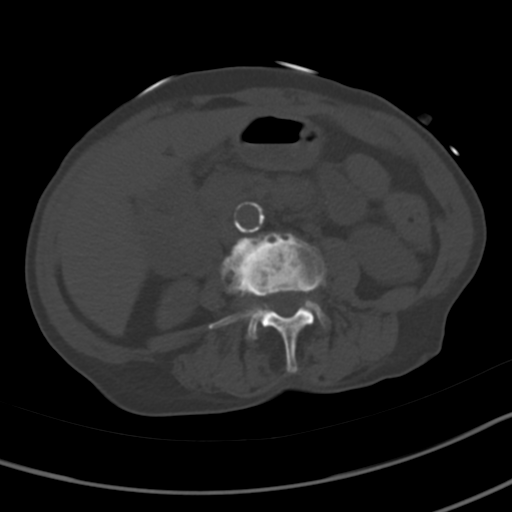
[im 54/72  soft-tissue]
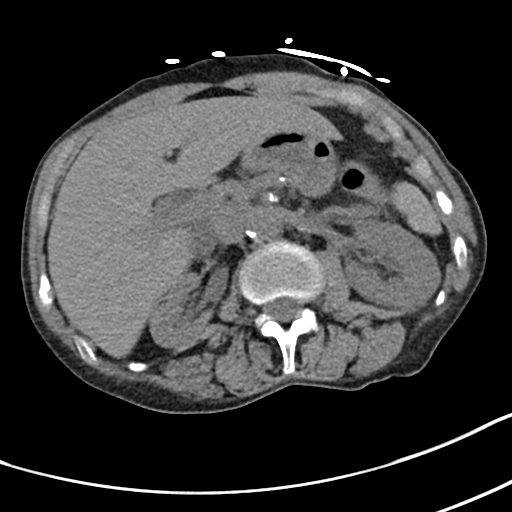
[im 63/72  soft-tissue]
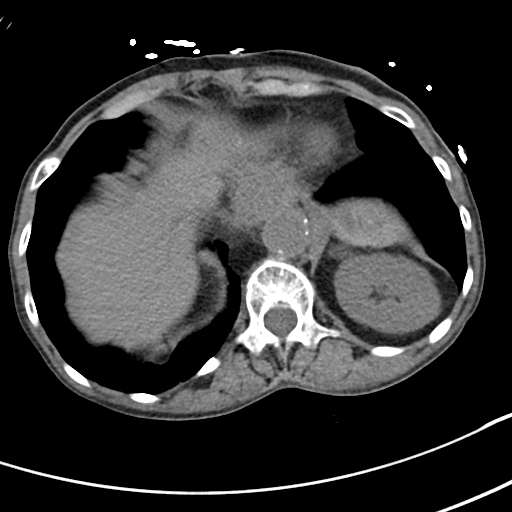
[im 67/72  soft-tissue]
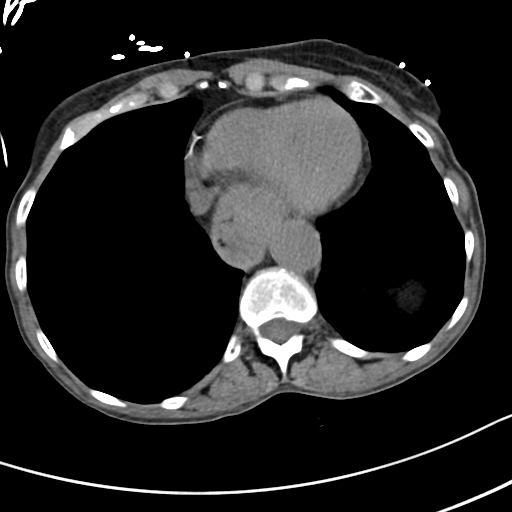

[Series 6: (person_name) · coronal · 0.57mm/px · 3 of 74 slices shown]
[im 25/74  soft-tissue]
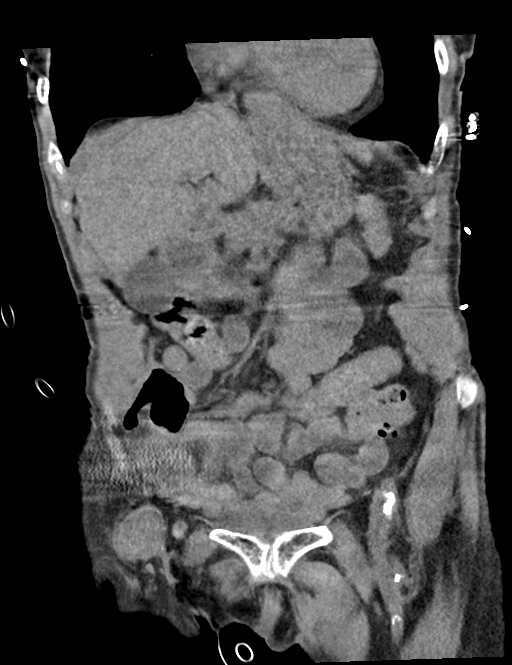
[im 33/74  soft-tissue]
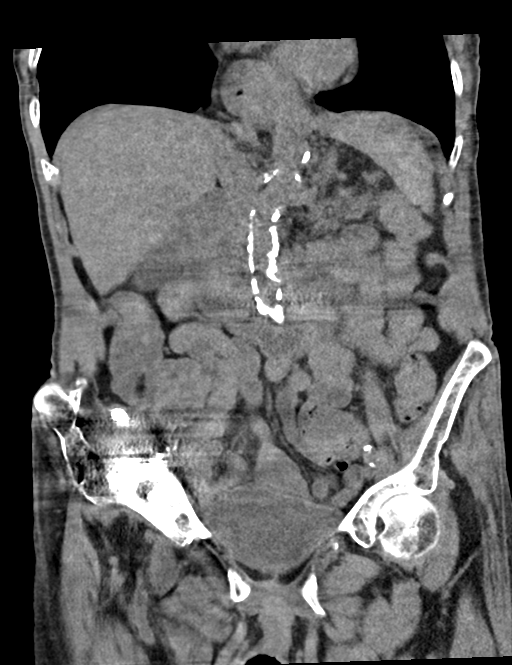
[im 41/74  soft-tissue]
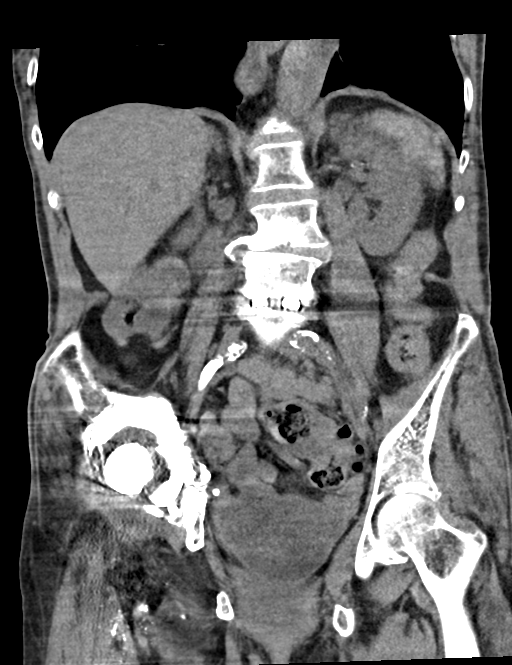

[15 of 46 positions shown; findings below may reference images not displayed]

FINDINGS: Lower chest: A cluster of 2 mm noncalcified lung nodules are seen
within the posterolateral aspect of the right lung base.

Hepatobiliary: No focal liver abnormality is seen. No gallstones,
gallbladder wall thickening, or biliary dilatation.

Pancreas: Unremarkable. No pancreatic ductal dilatation or
surrounding inflammatory changes.

Spleen: Normal in size without focal abnormality.

Adrenals/Urinary Tract: Adrenal glands are unremarkable. Kidneys are
normal, without renal calculi, focal lesion, or hydronephrosis.
Bladder is unremarkable.

Stomach/Bowel: There is a large gastric hernia. Appendix appears
normal. No evidence of bowel dilatation. Noninflamed diverticula are
seen throughout the sigmoid colon.

Vascular/Lymphatic: There is marked severity calcification of the
abdominal aorta and bilateral common iliac arteries, without
evidence of aneurysmal dilatation. A fem-fem bypass graft is seen.

A predominant stable 2.8 cm x 2.4 cm mildly lobulated area of
heterogeneous low attenuation is seen within the region anterior to
the right common femoral artery (axial CT image 58, CT series number
3).

Reproductive: The uterus is mildly enlarged and heterogeneous in
appearance. The bilateral adnexa are limited in evaluation secondary
to streak artifact.

Other: No abdominal wall hernia or abnormality. No abdominopelvic
ascites.

Musculoskeletal: A total right hip replacement is seen with
extensive amount of associated streak artifact. Subsequently limited
evaluation of the adjacent osseous and soft tissue structures is
noted.

Bilateral metallic density pedicle screws are seen at the levels of
L4 and L5. Metallic density operative material is seen along the
L4-L5 intervertebral disc space.
IMPRESSION: 1. Sigmoid diverticulosis without evidence of acute diverticulitis.
2. Postoperative changes within the lower lumbar spine.
3. Total right hip replacement.
4. Large gastric hernia.
5. Evidence of prior fem-fem bypass graft.
6. Predominant stable area of lobulated low-attenuation anterior to
the expected region of the right common femoral artery. While this
may be vascular in origin, the presence of a stable soft tissue mass
cannot be excluded.

Aortic Atherosclerosis ([id number]-NZJ.J).

## 2021-07-12 IMAGING — DX DG CHEST 1V PORT
1 series · 1 of 1 positions shown · non-contrast
Comparison: 06/02/2020.

CLINICAL DATA: Elevated D-dimer.

EXAM:
PORTABLE CHEST 1 VIEW

[chest ap]
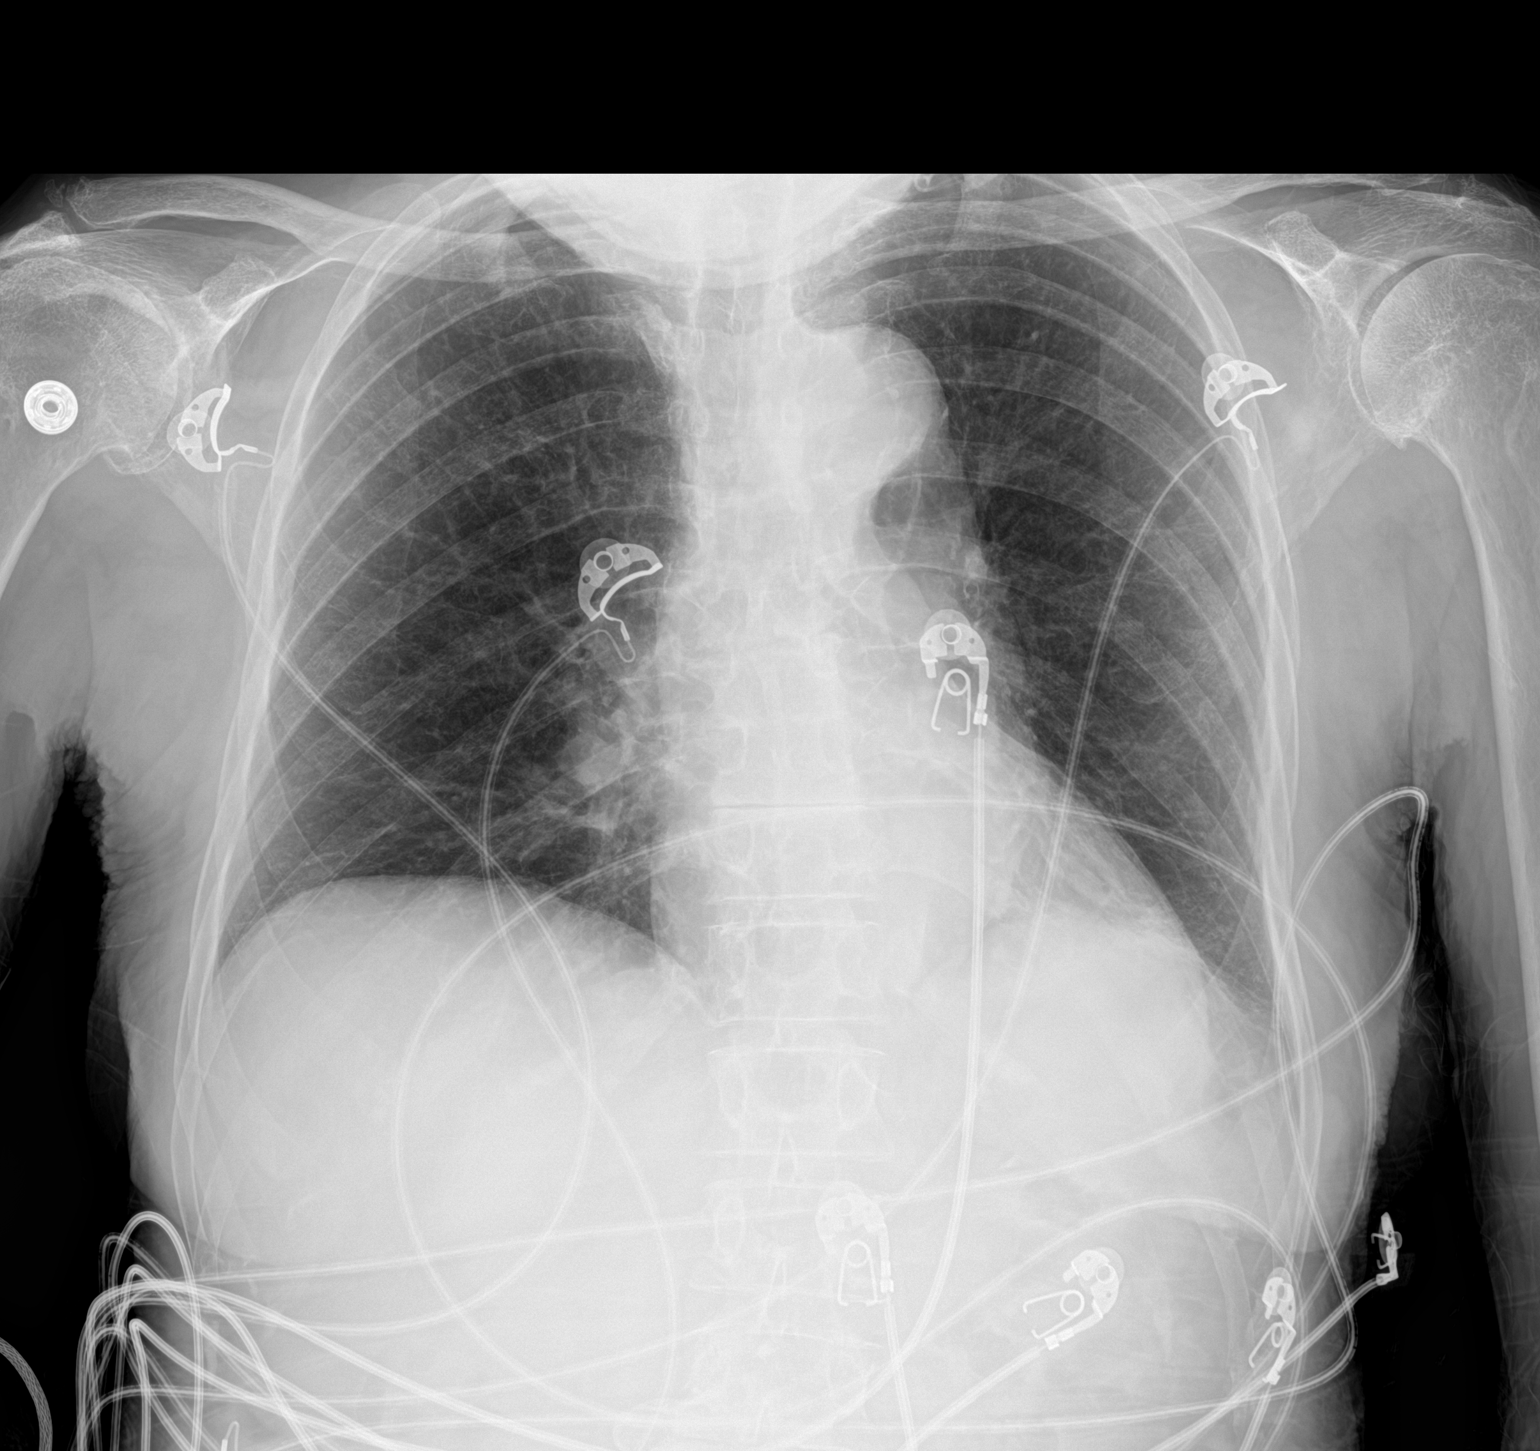

[1 of 1 positions shown; findings below may reference images not displayed]

FINDINGS: Mediastinum and hilar structures normal. Heart size normal. Mild
bibasilar atelectasis/infiltrates. No pleural effusion or
pneumothorax. Degenerative changes thoracic spine and both
shoulders. No acute bony abnormality.
IMPRESSION: Mild bibasilar atelectasis/infiltrates.

## 2021-07-12 IMAGING — NM NM PULMONARY PERF PARTICULATE
8 series · 8 of 8 positions shown · non-contrast
Comparison: Same day chest x-ray.  Perfusion scan 01/02/2020

CLINICAL DATA: [id number] positive

EXAM:
NUCLEAR MEDICINE PERFUSION LUNG SCAN
TECHNIQUE: Perfusion images were obtained in multiple projections after
intravenous injection of radiopharmaceutical.
Ventilation scans intentionally deferred if perfusion scan and chest
x-ray adequate for interpretation during COVID 19 epidemic.
RADIOPHARMACEUTICALS:  4.2 mCi Yc-99m MAA IV

[Series 1: ant/post perf · 4.14mm/px · 1 of 1 slices shown (1 of 2)]
[im 1/1]
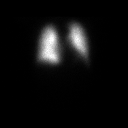

[Series 1: ant/post perf · 4.14mm/px · 1 of 1 slices shown (2 of 2)]
[im 1/1]
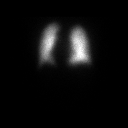

[Series 2: lao/rpo perf · 4.14mm/px · 1 of 1 slices shown (1 of 2)]
[im 1/1]
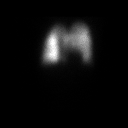

[Series 2: lao/rpo perf · 4.14mm/px · 1 of 1 slices shown (2 of 2)]
[im 1/1]
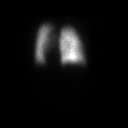

[Series 3: lpo/rao perf · 4.14mm/px · 1 of 1 slices shown (1 of 2)]
[im 1/1]
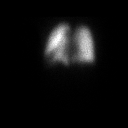

[Series 3: lpo/rao perf · 4.14mm/px · 1 of 1 slices shown (2 of 2)]
[im 1/1]
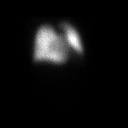

[Series 4: lt lat/rt lat perf · 4.14mm/px · 1 of 1 slices shown (1 of 2)]
[im 1/1]
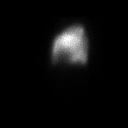

[Series 4: lt lat/rt lat perf · 4.14mm/px · 1 of 1 slices shown (2 of 2)]
[im 1/1]
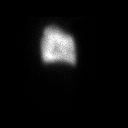

[8 of 8 positions shown; findings below may reference images not displayed]

FINDINGS: Physiologic distribution of radiotracer throughout both lungs. No
perfusion defect.
IMPRESSION: Normal perfusion scan without evidence for pulmonary embolism.

## 2021-07-13 ENCOUNTER — Other Ambulatory Visit: Payer: Self-pay

## 2021-07-13 DIAGNOSIS — I739 Peripheral vascular disease, unspecified: Secondary | ICD-10-CM

## 2021-07-13 NOTE — Addendum Note (Signed)
Addended by: Elby Beck R on: 07/13/2021 04:40 PM   Modules accepted: Orders

## 2021-07-13 NOTE — Addendum Note (Signed)
Addended by: Elby Beck R on: 07/13/2021 04:37 PM   Modules accepted: Orders

## 2021-07-14 ENCOUNTER — Other Ambulatory Visit: Payer: Medicare HMO

## 2021-07-18 ENCOUNTER — Encounter (HOSPITAL_COMMUNITY): Payer: Self-pay | Admitting: Emergency Medicine

## 2021-07-18 ENCOUNTER — Emergency Department (HOSPITAL_COMMUNITY): Payer: Medicare HMO

## 2021-07-18 ENCOUNTER — Emergency Department (HOSPITAL_COMMUNITY)
Admission: EM | Admit: 2021-07-18 | Discharge: 2021-07-18 | Disposition: A | Discharge status: Home / Self Care | Payer: Medicare HMO | Attending: Emergency Medicine | Admitting: Emergency Medicine

## 2021-07-18 ENCOUNTER — Other Ambulatory Visit: Payer: Self-pay

## 2021-07-18 DIAGNOSIS — W19XXXA Unspecified fall, initial encounter: Secondary | ICD-10-CM

## 2021-07-18 DIAGNOSIS — I129 Hypertensive chronic kidney disease with stage 1 through stage 4 chronic kidney disease, or unspecified chronic kidney disease: Secondary | ICD-10-CM | POA: Diagnosis not present

## 2021-07-18 DIAGNOSIS — Z79899 Other long term (current) drug therapy: Secondary | ICD-10-CM | POA: Diagnosis not present

## 2021-07-18 DIAGNOSIS — W01198A Fall on same level from slipping, tripping and stumbling with subsequent striking against other object, initial encounter: Secondary | ICD-10-CM | POA: Diagnosis not present

## 2021-07-18 DIAGNOSIS — I2510 Atherosclerotic heart disease of native coronary artery without angina pectoris: Secondary | ICD-10-CM | POA: Diagnosis not present

## 2021-07-18 DIAGNOSIS — Z7982 Long term (current) use of aspirin: Secondary | ICD-10-CM | POA: Insufficient documentation

## 2021-07-18 DIAGNOSIS — F0390 Unspecified dementia without behavioral disturbance: Secondary | ICD-10-CM | POA: Insufficient documentation

## 2021-07-18 DIAGNOSIS — E875 Hyperkalemia: Secondary | ICD-10-CM | POA: Diagnosis not present

## 2021-07-18 DIAGNOSIS — F17210 Nicotine dependence, cigarettes, uncomplicated: Secondary | ICD-10-CM | POA: Insufficient documentation

## 2021-07-18 DIAGNOSIS — Z8616 Personal history of COVID-19: Secondary | ICD-10-CM | POA: Diagnosis not present

## 2021-07-18 DIAGNOSIS — Z7902 Long term (current) use of antithrombotics/antiplatelets: Secondary | ICD-10-CM | POA: Insufficient documentation

## 2021-07-18 DIAGNOSIS — R140 Abdominal distension (gaseous): Secondary | ICD-10-CM | POA: Diagnosis not present

## 2021-07-18 DIAGNOSIS — Z96641 Presence of right artificial hip joint: Secondary | ICD-10-CM | POA: Insufficient documentation

## 2021-07-18 DIAGNOSIS — N184 Chronic kidney disease, stage 4 (severe): Secondary | ICD-10-CM | POA: Insufficient documentation

## 2021-07-18 DIAGNOSIS — S0990XA Unspecified injury of head, initial encounter: Secondary | ICD-10-CM | POA: Insufficient documentation

## 2021-07-18 DIAGNOSIS — R21 Rash and other nonspecific skin eruption: Secondary | ICD-10-CM | POA: Insufficient documentation

## 2021-07-18 LAB — URINALYSIS, ROUTINE W REFLEX MICROSCOPIC
Bilirubin Urine: NEGATIVE
Glucose, UA: NEGATIVE mg/dL
Hgb urine dipstick: NEGATIVE
Ketones, ur: NEGATIVE mg/dL
Leukocytes,Ua: NEGATIVE
Nitrite: NEGATIVE
Protein, ur: 100 mg/dL — AB
Specific Gravity, Urine: 1.008 (ref 1.005–1.030)
pH: 8 (ref 5.0–8.0)

## 2021-07-18 LAB — CBC WITH DIFFERENTIAL/PLATELET
Abs Immature Granulocytes: 0.04 10*3/uL (ref 0.00–0.07)
Basophils Absolute: 0.1 10*3/uL (ref 0.0–0.1)
Basophils Relative: 2 %
Eosinophils Absolute: 0.2 10*3/uL (ref 0.0–0.5)
Eosinophils Relative: 3 %
HCT: 29.1 % — ABNORMAL LOW (ref 36.0–46.0)
Hemoglobin: 9.3 g/dL — ABNORMAL LOW (ref 12.0–15.0)
Immature Granulocytes: 1 %
Lymphocytes Relative: 18 %
Lymphs Abs: 1.4 10*3/uL (ref 0.7–4.0)
MCH: 27.4 pg (ref 26.0–34.0)
MCHC: 32 g/dL (ref 30.0–36.0)
MCV: 85.8 fL (ref 80.0–100.0)
Monocytes Absolute: 1.1 10*3/uL — ABNORMAL HIGH (ref 0.1–1.0)
Monocytes Relative: 13 %
Neutro Abs: 5.3 10*3/uL (ref 1.7–7.7)
Neutrophils Relative %: 63 %
Platelets: 593 10*3/uL — ABNORMAL HIGH (ref 150–400)
RBC: 3.39 MIL/uL — ABNORMAL LOW (ref 3.87–5.11)
RDW: 19.1 % — ABNORMAL HIGH (ref 11.5–15.5)
WBC: 8.2 10*3/uL (ref 4.0–10.5)
nRBC: 0 % (ref 0.0–0.2)

## 2021-07-18 LAB — COMPREHENSIVE METABOLIC PANEL
ALT: 25 U/L (ref 0–44)
AST: 32 U/L (ref 15–41)
Albumin: 2.7 g/dL — ABNORMAL LOW (ref 3.5–5.0)
Alkaline Phosphatase: 125 U/L (ref 38–126)
Anion gap: 12 (ref 5–15)
BUN: 24 mg/dL — ABNORMAL HIGH (ref 8–23)
CO2: 16 mmol/L — ABNORMAL LOW (ref 22–32)
Calcium: 9.2 mg/dL (ref 8.9–10.3)
Chloride: 109 mmol/L (ref 98–111)
Creatinine, Ser: 2.28 mg/dL — ABNORMAL HIGH (ref 0.44–1.00)
GFR, Estimated: 22 mL/min — ABNORMAL LOW (ref >60)
Glucose, Bld: 149 mg/dL — ABNORMAL HIGH (ref 70–99)
Potassium: 5.6 mmol/L — ABNORMAL HIGH (ref 3.5–5.1)
Sodium: 137 mmol/L (ref 135–145)
Total Bilirubin: 0.6 mg/dL (ref 0.3–1.2)
Total Protein: 6.4 g/dL — ABNORMAL LOW (ref 6.5–8.1)

## 2021-07-18 LAB — MAGNESIUM: Magnesium: 2.4 mg/dL (ref 1.7–2.4)

## 2021-07-18 LAB — I-STAT CHEM 8, ED
BUN: 29 mg/dL — ABNORMAL HIGH (ref 8–23)
Calcium, Ion: 1.15 mmol/L (ref 1.15–1.40)
Chloride: 113 mmol/L — ABNORMAL HIGH (ref 98–111)
Creatinine, Ser: 2.3 mg/dL — ABNORMAL HIGH (ref 0.44–1.00)
Glucose, Bld: 153 mg/dL — ABNORMAL HIGH (ref 70–99)
HCT: 23 % — ABNORMAL LOW (ref 36.0–46.0)
Hemoglobin: 7.8 g/dL — ABNORMAL LOW (ref 12.0–15.0)
Potassium: 5.6 mmol/L — ABNORMAL HIGH (ref 3.5–5.1)
Sodium: 138 mmol/L (ref 135–145)
TCO2: 20 mmol/L — ABNORMAL LOW (ref 22–32)

## 2021-07-18 LAB — I-STAT VENOUS BLOOD GAS, ED
Acid-base deficit: 4 mmol/L — ABNORMAL HIGH (ref 0.0–2.0)
Bicarbonate: 19.5 mmol/L — ABNORMAL LOW (ref 20.0–28.0)
Calcium, Ion: 1.17 mmol/L (ref 1.15–1.40)
HCT: 23 % — ABNORMAL LOW (ref 36.0–46.0)
Hemoglobin: 7.8 g/dL — ABNORMAL LOW (ref 12.0–15.0)
O2 Saturation: 100 %
Potassium: 5.6 mmol/L — ABNORMAL HIGH (ref 3.5–5.1)
Sodium: 139 mmol/L (ref 135–145)
TCO2: 20 mmol/L — ABNORMAL LOW (ref 22–32)
pCO2, Ven: 26.5 mmHg — ABNORMAL LOW (ref 44.0–60.0)
pH, Ven: 7.473 — ABNORMAL HIGH (ref 7.250–7.430)
pO2, Ven: 152 mmHg — ABNORMAL HIGH (ref 32.0–45.0)

## 2021-07-18 LAB — LIPASE, BLOOD: Lipase: 19 U/L (ref 11–51)

## 2021-07-18 LAB — PHOSPHORUS: Phosphorus: 4.3 mg/dL (ref 2.5–4.6)

## 2021-07-18 IMAGING — DX DG CHEST 1V PORT
1 series · 1 of 1 positions shown · non-contrast
Comparison: 06/03/2020

CLINICAL DATA: Shortness of breath, [id number]-FC positivity, initial
encounter

EXAM:
PORTABLE CHEST 1 VIEW

[chest]
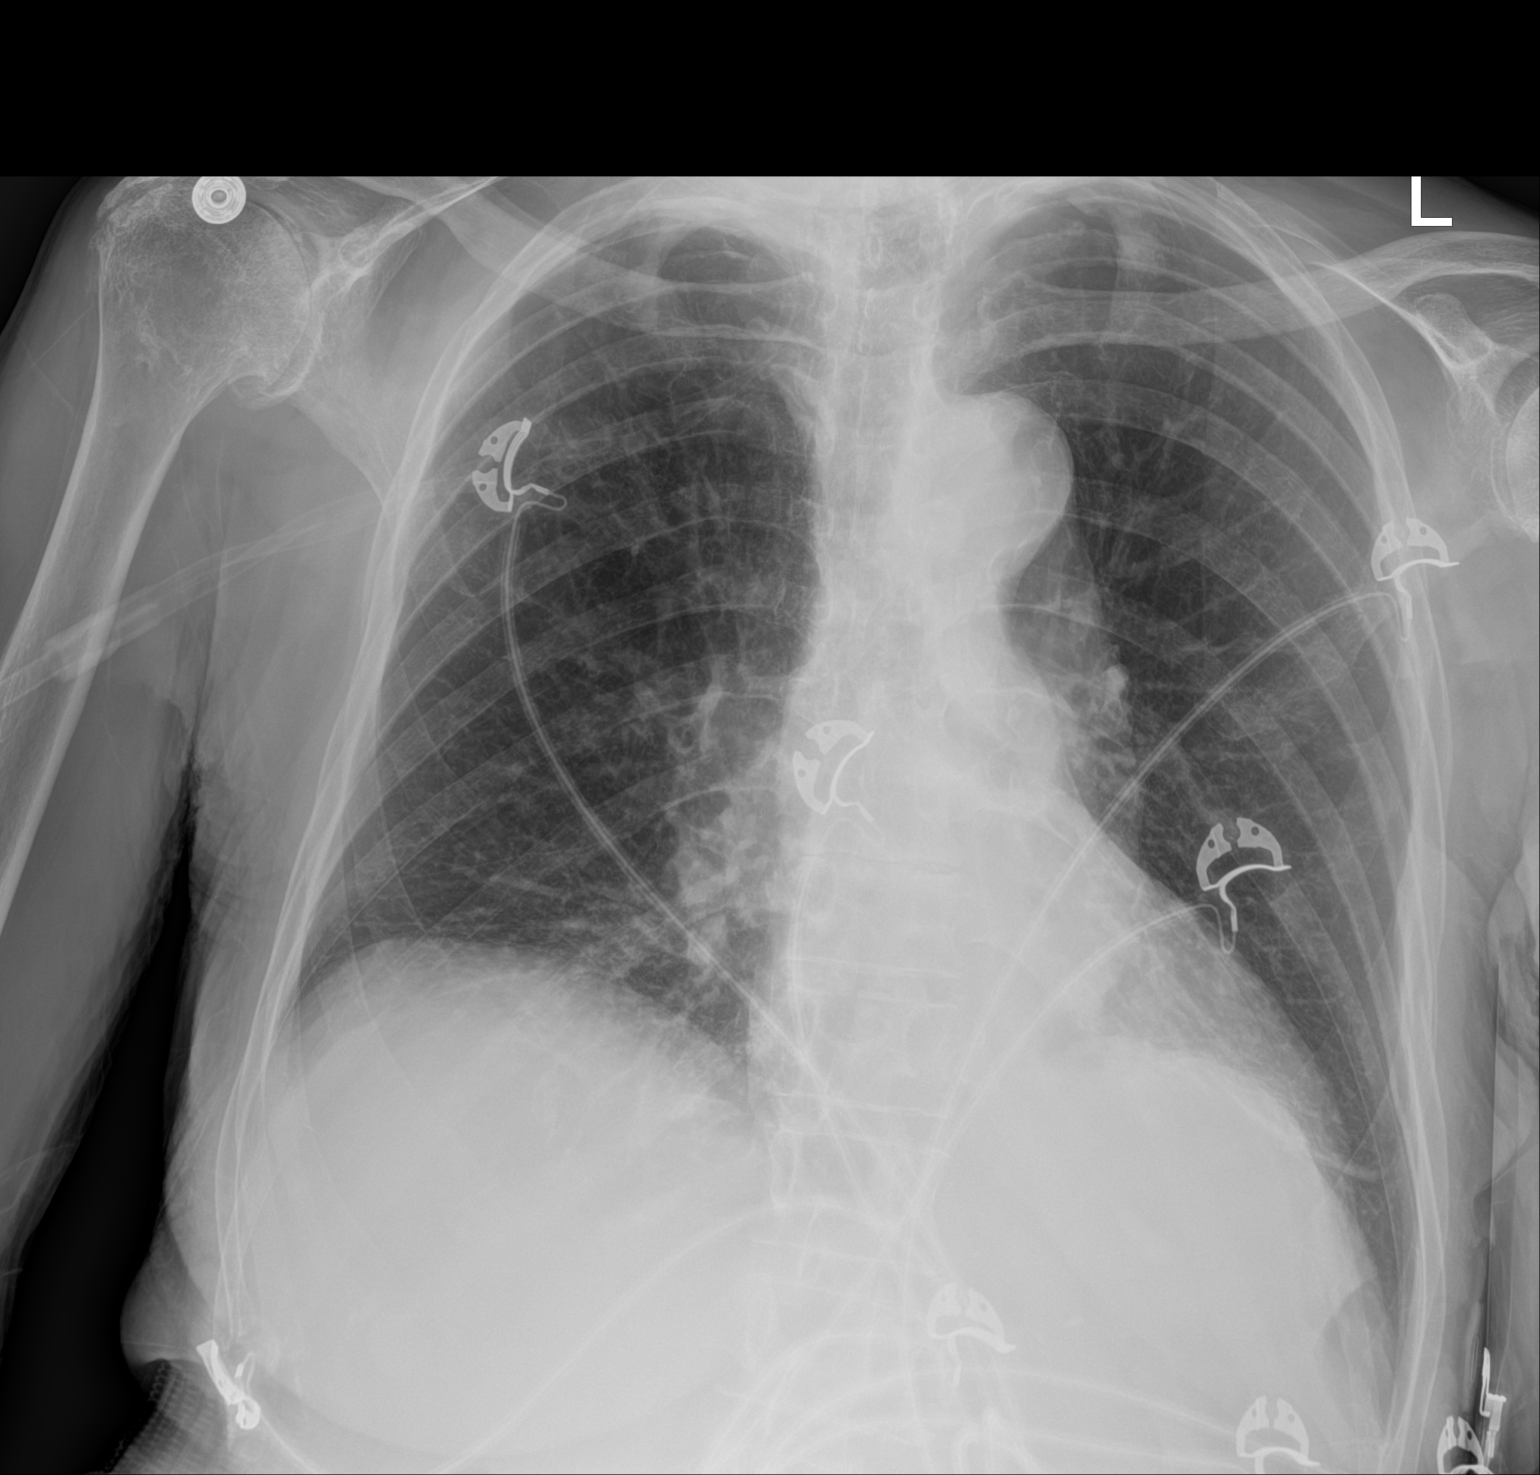

[1 of 1 positions shown; findings below may reference images not displayed]

FINDINGS: Cardiac shadow is stable. Aortic calcifications are again seen. The
lungs are well aerated bilaterally. No focal infiltrate or sizable
effusion is seen. Old healed rib fractures are noted.
IMPRESSION: No acute abnormality noted.

Aortic Atherosclerosis ([id number]-F03.3).

## 2021-07-18 MED ORDER — SODIUM ZIRCONIUM CYCLOSILICATE 10 G PO PACK
10.0000 g | PACK | Freq: Once | ORAL | Status: AC
  Administered 2021-07-18: 10 g via ORAL
  Filled 2021-07-18: qty 1

## 2021-07-18 MED ORDER — CALCIUM GLUCONATE 10 % IV SOLN
1.0000 g | Freq: Once | INTRAVENOUS | Status: AC
  Administered 2021-07-18: 1 g via INTRAVENOUS
  Filled 2021-07-18: qty 10

## 2021-07-18 MED ORDER — SODIUM BICARBONATE 8.4 % IV SOLN
50.0000 meq | Freq: Once | INTRAVENOUS | Status: AC
  Administered 2021-07-18: 50 meq via INTRAVENOUS
  Filled 2021-07-18: qty 50

## 2021-07-18 MED ORDER — OXYCODONE HCL 5 MG PO TABS
5.0000 mg | ORAL_TABLET | Freq: Once | ORAL | Status: AC
  Administered 2021-07-18: 5 mg via ORAL
  Filled 2021-07-18: qty 1

## 2021-07-18 MED ORDER — OXYCODONE HCL 5 MG PO TABS
5.0000 mg | ORAL_TABLET | Freq: Once | ORAL | Status: AC
Start: 2021-07-18 — End: 2021-07-18
  Administered 2021-07-18: 5 mg via ORAL
  Filled 2021-07-18: qty 1

## 2021-07-18 NOTE — ED Notes (Signed)
Patient transported to CT 

## 2021-07-18 NOTE — Discharge Instructions (Addendum)
Ms. Gadberry was seen in the ER for fall and elevated potassium. Her potassium is at 5.6 in the ER.  No EKG changes with it.  She was given Lokelma and calcium gluconate for her potassium along with sodium bicarb.  Given her chronic kidney disease, we would like her to have her basic metabolic profile checked again tomorrow to ensure the potassium is not rising.  Please have her follow-up with her nephrologist.  Continue giving her Lokelma as prescribed.  Send her back to the ER if her potassium is over 6.  CT head of her brain was normal.  Rest of her work-up is not showing any acute findings.

## 2021-07-18 NOTE — ED Notes (Signed)
RN reviewed discharge instructions w/ pt's son. Results and follow up reviewed, pt's son had no further questions

## 2021-07-18 NOTE — ED Provider Notes (Signed)
[facility] EMERGENCY DEPARTMENT Provider Note   CSN: [id number] Arrival date & time: 07/18/21  1120     History Chief Complaint  Patient presents with  . Fall  . abnormal labs    Alyssa [name] is a 80 y.o. female.  HPI    80 year old female with hypertension, HLD, COPD, PVD, CKD stage IV, COVID-19 infection in 2021, dementia presents to the ER with abnormal labs.  Pt c/o abd pain on the L side. She was recently admitted to the hospital where she was noted to have rash on the left side and is being treated as shingles infection.  During her recent admission she was also found to have collapsed left side of the lung, femoral pseudoaneurysm at the anastomosis of the previous bypass, anemia.   Spoke with Joseph Art - RN at [facility] care. Pt had 2 unwitnessed falls x2 days and her potassium was high. She was not using her walker, she has been advised to call for help. No n/v/f/c and she has been eating ok.  Patient is on Plavix.  Past Medical History:  Diagnosis Date  . Acute respiratory failure with hypoxia ([place]) 11/01/2019  . Anxiety   . Arthritis   . Chest pain   . Chronic kidney disease   . Dyspnea   . Elevated troponin 12/13/2018  . Headache(784.0)   . Hyperlipidemia   . Hypertension   . Joint pain   . Leg pain   . Peripheral neuropathy   . Peripheral vascular disease ([place])   . Syncope 09/2020    Patient Active Problem List   Diagnosis Date Noted  . Rectal bleeding   . Rectal pain   . Malnutrition of moderate degree 06/17/2021  . Weight loss 06/16/2021  . Zoster 06/16/2021  . Collapsed lung   . COPD  GOLD 0/ still smoking  09/26/2020  . Cigarette smoker 09/26/2020  . Shortness of breath 07/06/2020  . Essential hypertension 07/06/2020  . Abdominal pain 07/06/2020  . Elevated INR 07/06/2020  . Acute respiratory failure due to COVID-19 ([place]) 06/02/2020  . Acute renal failure superimposed on chronic kidney disease ([place]) 02/06/2020   . Leukocytosis 02/06/2020  . Thrombocytosis 02/06/2020  . Palliative care encounter   . Constipation 01/05/2020  . Protein-calorie malnutrition, severe ([place]) 01/04/2020  . Acute on chronic renal failure ([place]) 01/04/2020  . Hyperkalemia 01/03/2020  . AKI (acute kidney injury) ([place]) 01/03/2020  . Acute respiratory failure with hypoxia ([place]) 11/01/2019  . UTI (urinary tract infection) 11/01/2019  . CAP (community acquired pneumonia) 11/01/2019  . Acute cystitis without hematuria   . Encephalopathy 09/18/2019  . SAH (subarachnoid hemorrhage) ([place]) 09/18/2019  . Dementia ([place]) 09/18/2019  . Metabolic acidosis 76/73/4193  . ARF (acute renal failure) ([place]) 09/17/2019  . Acute encephalopathy 09/17/2019  . Abnormal CT scan, colon   . Diarrhea   . Diverticulitis 06/07/2019  . Depression with anxiety 06/07/2019  . DNR (do not resuscitate) 03/23/2019  . Volume overload 03/23/2019  . Edema 03/23/2019  . Acute renal failure (ARF) ([place]) 03/08/2019  . Hypokalemia 03/08/2019  . Palliative care by specialist   . Goals of care, counseling/discussion   . Altered mental status   . Encephalopathy, toxic 12/24/2018  . Aspiration pneumonia of both lower lobes due to gastric secretions ([place]) 12/24/2018  . Non-ST elevation (NSTEMI) myocardial infarction ([place])   . Chest pain   . Dyspnea on exertion   . Elevated troponin level 12/09/2018  . Chronic migraine 03/27/2018  .  Lumbar radiculopathy 03/27/2018  . Gait abnormality 03/27/2018  . Hypertensive urgency 12/28/2016  . CKD (chronic kidney disease) stage 4, GFR 15-29 ml/min (HCC) 12/28/2016  . Anemia 12/28/2016  . Chronic pain syndrome 12/28/2016  . Benzodiazepine withdrawal without complication ([place]) 16/38/4536  . Chronic right shoulder pain 12/06/2016  . Fall 09/17/2016  . Fracture of humeral head, closed, right, initial encounter 09/17/2016  . Rib fractures 09/17/2016  . Multiple falls 09/17/2016  . Severe muscle deconditioning 09/17/2016   . Failure to thrive in adult 09/17/2016  . Melena 04/25/2016  . Transient hypotension 04/25/2016  . Anxiety state 04/25/2016  . Tobacco abuse 04/25/2016  . Hyperlipidemia 04/25/2016  . Syncope 04/24/2016  . Near syncope 04/24/2016  . Gait difficulty 01/12/2016  . Foot drop, left 01/12/2016  . Severe recurrent major depression without psychotic features ([place]) 08/28/2015  . Spondylolisthesis at L4-L5 level 07/30/2015  . PVD (peripheral vascular disease) ([place]) 07/29/2014  . Weakness-Bilateral arm/leg 07/29/2014  . Numbness-left leg 07/29/2014  . Swelling of limb-Legs 07/29/2014  . Atherosclerosis of native arteries of the extremities with intermittent claudication 09/30/2011    Past Surgical History:  Procedure Laterality Date  . ABDOMINAL ANGIOGRAM N/A 10/29/2011   Procedure: ABDOMINAL ANGIOGRAM;  Surgeon: Elam Dutch, MD;  Location: [facility] CATH LAB;  Service: Cardiovascular;  Laterality: N/A;  . BACK SURGERY    . COLONOSCOPY    . ESOPHAGOGASTRODUODENOSCOPY    . FEMORAL-FEMORAL BYPASS GRAFT  08/31/2010  . FLEXIBLE SIGMOIDOSCOPY N/A 06/12/2019   Procedure: FLEXIBLE SIGMOIDOSCOPY;  Surgeon: Ladene Artist, MD;  Location: [facility] ENDOSCOPY;  Service: Endoscopy;  Laterality: N/A;  Patient had little bit to eat this morning so this will be unsedated.  Marland Kitchen JOINT REPLACEMENT Right 10/18/2010   Hip  . LOWER EXTREMITY ANGIOGRAPHY  06/14/2017   Procedure: Lower Extremity Angiography;  Surgeon: Adrian Prows, MD;  Location: [facility] CV LAB;  Service: Cardiovascular;;  Bilateral limited angio performed  . RENAL ANGIOGRAPHY N/A 06/14/2017   Procedure: Renal Angiography;  Surgeon: Adrian Prows, MD;  Location: [facility] CV LAB;  Service: Cardiovascular;  Laterality: N/A;  . TOTAL HIP ARTHROPLASTY     right     OB History   No obstetric history on file.     Family History  Problem Relation Age of Onset  . Heart attack Mother   . Heart disease Mother   . Hyperlipidemia Mother   .  Hypertension Mother   . Heart attack Father   . Heart disease Father        Before age 14  . Hyperlipidemia Father   . Hypertension Father   . Cancer Sister        brain tumor  . Aneurysm Brother        brain  . Colon cancer Neg Hx   . Liver cancer Neg Hx     Social History   Tobacco Use  . Smoking status: Every Day    Packs/day: 1.00    Years: 50.00    Pack years: 50.00    Types: Cigarettes  . Smokeless tobacco: Never  . Tobacco comments:    1/4 ppd 12/05/20//lmr  Vaping Use  . Vaping Use: Never used  Substance Use Topics  . Alcohol use: No  . Drug use: No    Home Medications Prior to Admission medications   Medication Sig Start Date End Date Taking? Authorizing Provider  acetaminophen (TYLENOL) 325 MG tablet Take 2 tablets (650 mg total) by mouth every 6 (six) hours  as needed for mild pain, moderate pain or fever (or Fever >/= 101). Patient taking differently: Take 650 mg by mouth every 6 (six) hours as needed for mild pain. 06/16/19  Yes Hongalgi, Lenis Dickinson, MD  albuterol (VENTOLIN HFA) 108 (90 Base) MCG/ACT inhaler Inhale 2 puffs into the lungs every 4 (four) hours as needed for wheezing or shortness of breath.    Yes [provider]  amLODipine (NORVASC) 10 MG tablet Take 1 tablet (10 mg total) by mouth at bedtime. Patient taking differently: Take 10 mg by mouth every morning. 07/10/21  Yes Kc, Maren Beach, MD  aspirin 81 MG EC tablet Take 1 tablet (81 mg total) by mouth daily. 01/09/19  Yes Gherghe, Vella Redhead, MD  Budeson-Glycopyrrol-Formoterol (BREZTRI AEROSPHERE) 160-9-4.8 MCG/ACT AERO Inhale 2 puffs into the lungs 2 (two) times daily. 09/25/20  Yes Tanda Rockers, MD  carvedilol (COREG) 12.5 MG tablet Take 12.5 mg by mouth 2 (two) times daily. 09/05/20  Yes [provider]  cloNIDine (CATAPRES) 0.2 MG tablet Take 0.2 mg by mouth at bedtime. 05/25/20  Yes [provider]  clopidogrel (PLAVIX) 75 MG tablet Take 1 tablet (75 mg total) by mouth daily.  01/09/19  Yes Caren Griffins, MD  diclofenac Sodium (VOLTAREN) 1 % GEL Apply 2 g topically every 6 (six) hours as needed (knee pain). 01/15/20  Yes [provider]  dicyclomine (BENTYL) 20 MG tablet Take 20 mg by mouth 3 (three) times daily. 04/01/20  Yes [provider]  docusate sodium (COLACE) 100 MG capsule Take 1 capsule (100 mg total) by mouth daily. 07/11/21  Yes Kc, Maren Beach, MD  donepezil (ARICEPT) 5 MG tablet Take 1 tablet (5 mg total) by mouth daily. Patient taking differently: Take 5 mg by mouth at bedtime. 11/20/20  Yes Cameron Sprang, MD  DULoxetine 40 MG CPEP Take 40 mg by mouth daily. 07/11/21  Yes Antonieta Pert, MD  Ensure Plus (ENSURE PLUS) LIQD Take by mouth 2 (two) times daily.   Yes [provider]  Ferrous Sulfate (IRON HIGH-POTENCY) 325 MG TABS Take 325 mg by mouth daily with breakfast. 06/30/19  Yes Hongalgi, Lenis Dickinson, MD  hydrocortisone (ANUSOL-HC) 25 MG suppository Place 1 suppository (25 mg total) rectally 2 (two) times daily. 07/10/21  Yes Antonieta Pert, MD  hydrOXYzine (ATARAX/VISTARIL) 25 MG tablet Take 25 mg by mouth at bedtime. 05/12/20  Yes [provider]  Lidocaine 4 % PTCH Place 2 patches onto the skin See admin instructions. Apply 2 patches transdermally to both knees every morning for pain, remove every evening   Yes [provider]  magnesium oxide (MAG-OX) 400 MG tablet Take 400 mg by mouth 2 (two) times daily.   Yes [provider]  megestrol (MEGACE) 40 MG tablet Take 40 mg by mouth 2 (two) times daily. 05/12/20  Yes [provider]  Multiple Vitamin (MULTIVITAMIN WITH MINERALS) TABS tablet Take 1 tablet by mouth daily. 06/17/19  Yes Hongalgi, Lenis Dickinson, MD  nitroGLYCERIN (NITROSTAT) 0.4 MG SL tablet Place 1 tablet (0.4 mg total) under the tongue every 5 (five) minutes as needed for chest pain. 01/07/20  Yes Allie Bossier, MD  ondansetron (ZOFRAN ODT) 4 MG disintegrating tablet Take 1 tablet (4 mg total) by mouth  every 4 (four) hours as needed for nausea or vomiting. 06/09/20  Yes Charlesetta Shanks, MD  oxyCODONE (OXY IR/ROXICODONE) 5 MG immediate release tablet Take 1 tablet (5 mg total) by mouth every 8 (eight) hours as needed for  up to 10 doses for moderate pain. 07/10/21  Yes Antonieta Pert, MD  pantoprazole (PROTONIX) 40 MG tablet Take 1 tablet (40 mg total) by mouth daily. 06/16/19  Yes Hongalgi, Lenis Dickinson, MD  pregabalin (LYRICA) 75 MG capsule Take 1 capsule (75 mg total) by mouth daily. 07/11/21 08/10/21 Yes Antonieta Pert, MD  sodium bicarbonate 650 MG tablet Take 2 tablets (1,300 mg total) by mouth 2 (two) times daily. 01/07/20  Yes Allie Bossier, MD  sodium zirconium cyclosilicate (LOKELMA) 5 g packet Take 5 g by mouth daily. Patient taking differently: Take 5 g by mouth daily. Mix with 4-8 oz water prior to administration 07/10/21  Yes Kc, Maren Beach, MD  Vitamin D, Ergocalciferol, (DRISDOL) 1.25 MG (50000 UNIT) CAPS capsule Take 50,000 Units by mouth every Monday.   Yes [provider]    Allergies    Patient has no known allergies.  Review of Systems   Review of Systems  Unable to perform ROS: Dementia   Physical Exam Updated Vital Signs BP (!) 169/108   Pulse 83   Temp 98.9 F (37.2 C) (Rectal)   Resp (!) 39   SpO2 98%   Physical Exam Vitals and nursing note reviewed.  Constitutional:      Appearance: She is well-developed.  HENT:     Head: Atraumatic.  Cardiovascular:     Rate and Rhythm: Normal rate.  Pulmonary:     Effort: Pulmonary effort is normal.     Breath sounds: No wheezing or rhonchi.  Abdominal:     General: There is distension.     Comments: Left lateral abdominal rash, abdomen is otherwise soft and there is no peritoneal findings  Musculoskeletal:     Cervical back: Normal range of motion and neck supple.  Skin:    General: Skin is warm and dry.     Findings: Rash present.  Neurological:     Mental Status: She is alert and oriented to person, place, and time.     ED Results / Procedures / Treatments   Labs (all labs ordered are listed, but only abnormal results are displayed) Labs Reviewed  COMPREHENSIVE METABOLIC PANEL - Abnormal; Notable for the following components:      Result Value   Potassium 5.6 (*)    CO2 16 (*)    Glucose, Bld 149 (*)    BUN 24 (*)    Creatinine, Ser 2.28 (*)    Total Protein 6.4 (*)    Albumin 2.7 (*)    GFR, Estimated 22 (*)    All other components within normal limits  CBC WITH DIFFERENTIAL/PLATELET - Abnormal; Notable for the following components:   RBC 3.39 (*)    Hemoglobin 9.3 (*)    HCT 29.1 (*)    RDW 19.1 (*)    Platelets 593 (*)    Monocytes Absolute 1.1 (*)    All other components within normal limits  URINALYSIS, ROUTINE W REFLEX MICROSCOPIC - Abnormal; Notable for the following components:   Color, Urine STRAW (*)    Protein, ur 100 (*)    Bacteria, UA RARE (*)    All other components within normal limits  I-STAT CHEM 8, ED - Abnormal; Notable for the following components:   Potassium 5.6 (*)    Chloride 113 (*)    BUN 29 (*)    Creatinine, Ser 2.30 (*)    Glucose, Bld 153 (*)    TCO2 20 (*)    Hemoglobin 7.8 (*)  HCT 23.0 (*)    All other components within normal limits  I-STAT VENOUS BLOOD GAS, ED - Abnormal; Notable for the following components:   pH, Ven 7.473 (*)    pCO2, Ven 26.5 (*)    pO2, Ven 152.0 (*)    Bicarbonate 19.5 (*)    TCO2 20 (*)    Acid-base deficit 4.0 (*)    Potassium 5.6 (*)    HCT 23.0 (*)    Hemoglobin 7.8 (*)    All other components within normal limits  LIPASE, BLOOD  MAGNESIUM  PHOSPHORUS    EKG EKG Interpretation  Date/Time:  Saturday July 18 2021 12:11:00 EDT Ventricular Rate:  87 PR Interval:  172 QRS Duration: 86 QT Interval:  375 QTC Calculation: 452 R Axis:   65 Text Interpretation: Sinus rhythm Probable LVH with secondary repol abnrm No acute changes No significant change since last tracing TWI in the inferior and lateral  leads are not new Confirmed by Varney Biles [phone]) on 07/18/2021 2:36:55 PM  Radiology CT Head Wo Contrast  Result Date: 07/18/2021 CLINICAL DATA:  Head trauma, mod-severe. Un witnessed fall yesterday, hitting the back of the head. On Plavix. EXAM: CT HEAD WITHOUT CONTRAST TECHNIQUE: Contiguous axial images were obtained from the base of the skull through the vertex without intravenous contrast. COMPARISON:  07/02/2021 FINDINGS: Brain: There is no evidence of an acute infarct, intracranial hemorrhage, mass, midline shift, or extra-axial fluid collection. Hypodensities in the cerebral white matter bilaterally are unchanged and nonspecific but compatible with moderate chronic small vessel ischemic disease. A small chronic infarct is again noted in the left basal ganglia/internal capsule. Mild cerebral atrophy is most notable in the perisylvian regions. Vascular: Calcified atherosclerosis at the skull base. No hyperdense vessel. Skull: No fracture or suspicious osseous lesion. Sinuses/Orbits: Prior sinus surgery including right-sided maxillary antrostomy and partial ethmoidectomy. Mild mucosal thickening in the right maxillary sinus. Trace bilateral mastoid effusions. Bilateral cataract extraction. Other: None. IMPRESSION: 1. No evidence of acute intracranial abnormality. 2. Moderate chronic small vessel ischemic disease. Electronically Signed   By: Logan Bores M.D.   On: 07/18/2021 15:53   DG Chest Port 1 View  Result Date: 07/18/2021 CLINICAL DATA:  History of collapsed lung.  Un witnessed fall. EXAM: PORTABLE CHEST 1 VIEW COMPARISON:  Chest x-ray 06/26/2021. FINDINGS: Cardiomediastinal silhouette is unchanged, the heart is enlarged. The lungs and costophrenic angles are clear. There is no evidence for pneumothorax. There is a healed left seventh rib fracture. There is a healed right fifth rib fracture. No acute fractures are seen. IMPRESSION: No active disease. Electronically Signed   By: Ronney Asters  M.D.   On: 07/18/2021 15:20    Procedures Procedures   Medications Ordered in ED Medications  calcium gluconate inj 10% (1 g) URGENT USE ONLY! (has no administration in time range)  sodium bicarbonate injection 50 mEq (has no administration in time range)  oxyCODONE (Oxy IR/ROXICODONE) immediate release tablet 5 mg (5 mg Oral Given 07/18/21 1241)  sodium zirconium cyclosilicate (LOKELMA) packet 10 g (10 g Oral Given 07/18/21 1242)    ED Course  I have reviewed the triage vital signs and the nursing notes.  Pertinent labs & imaging results that were available during my care of the patient were reviewed by me and considered in my medical decision making (see chart for details).  Clinical Course as of 07/18/21 1914  Sat Jul 18, 2021  1912 Hemoglobin(!): 9.3 At baseline for the patient [AN]  1912 CreatinineMarland Kitchen):  2.30 At baseline per the patient [AN]  1912 Potassium(!): 5.6 Slightly elevated potassium with no EKG changes. [AN]  1912 CT Head Wo Contrast No evidence of any fracture or internal brain bleed.  X-ray of the chest was also ordered which did not reveal any acute disease [AN]  1913 DG Chest Port 1 View Imaging independently reviewed by me. [AN]  1913 Attempted to call the patient's son at the number ending with [phone] without any response.  No voicemail was left. [AN]  1913 Nursing staff informed me that patient was ambulated.  She was unsteady but able to manage with support. [AN]    Clinical Course User Index [AN] Varney Biles, MD   MDM Rules/Calculators/A&P                           Patient comes in with chief complaint of fall and abnormal lab.  She reports abdominal pain.  She was recently diagnosed with shingles over her abdominal wall and is on medications for it.  She is currently at a rehab facility where there were 2 unwitnessed falls.  Her potassium was noted to be over 6 and she was sent here.  She is already on [facility].  In addition to CKD patient also has  COPD, dementia, CAD, PVD.  During her last admission there was anemia, femoral pseudoaneurysm and collapsed lung noted.  We will get x-ray of chest and CT head and also check her hemoglobin.  Repeat potassium ordered.  Final Clinical Impression(s) / ED Diagnoses Final diagnoses:  CKD (chronic kidney disease) stage 4, GFR 15-29 ml/min ([place])  Fall, initial encounter  Hyperkalemia    Rx / DC Orders ED Discharge Orders     None        Varney Biles, MD 07/18/21 1914

## 2021-07-18 NOTE — ED Notes (Signed)
Pt ambulated w/ walker, requiring 1 person assist. Pt has uneven gait, but is stable w/ ambulation. Pt endorsed worsening abd/back pain w/ movement, pt became tachypneic w/ exertion but SpO2 remained 98% on RA.

## 2021-07-18 NOTE — ED Notes (Signed)
[facility] here to transport pt ?

## 2021-07-18 NOTE — ED Triage Notes (Signed)
Pt arrived via [facility] from [facility] for abnormal labs. Pt also had multiple unwitnessed fall yesterday and pt states did hit the back of her head. Pt is on plavix. Per EMS pt has hx of hernia and has a large mass in LLQ of abdomen that is soft, tender upon palpation and hot to touch.

## 2021-07-18 NOTE — ED Notes (Signed)
Called [facility] to transport patient to [facility].

## 2021-07-21 ENCOUNTER — Encounter (HOSPITAL_COMMUNITY): Payer: Medicare HMO

## 2021-07-21 ENCOUNTER — Encounter: Payer: Medicare HMO | Admitting: Vascular Surgery

## 2021-07-22 ENCOUNTER — Ambulatory Visit (HOSPITAL_COMMUNITY)
Admission: RE | Admit: 2021-07-22 | Discharge: 2021-07-22 | Disposition: A | Discharge status: Home / Self Care | Payer: Medicare HMO | Source: Ambulatory Visit | Attending: Internal Medicine | Admitting: Internal Medicine

## 2021-07-22 ENCOUNTER — Other Ambulatory Visit: Payer: Self-pay

## 2021-07-22 DIAGNOSIS — J9601 Acute respiratory failure with hypoxia: Secondary | ICD-10-CM | POA: Diagnosis present

## 2021-07-22 DIAGNOSIS — J449 Chronic obstructive pulmonary disease, unspecified: Secondary | ICD-10-CM | POA: Diagnosis present

## 2021-07-23 ENCOUNTER — Encounter: Payer: Self-pay | Admitting: Internal Medicine

## 2021-07-23 ENCOUNTER — Ambulatory Visit (INDEPENDENT_AMBULATORY_CARE_PROVIDER_SITE_OTHER): Payer: Medicare HMO | Admitting: Internal Medicine

## 2021-07-23 DIAGNOSIS — J449 Chronic obstructive pulmonary disease, unspecified: Secondary | ICD-10-CM | POA: Diagnosis not present

## 2021-07-23 MED ORDER — BREZTRI AEROSPHERE 160-9-4.8 MCG/ACT IN AERO: 2.0000 | INHALATION_SPRAY | Freq: Two times a day (BID) | RESPIRATORY_TRACT | 11 refills | Status: DC

## 2021-07-23 MED ORDER — BREZTRI AEROSPHERE 160-9-4.8 MCG/ACT IN AERO: 2.0000 | INHALATION_SPRAY | Freq: Two times a day (BID) | RESPIRATORY_TRACT | 0 refills | Status: DC

## 2021-07-23 NOTE — Assessment & Plan Note (Addendum)
Quit smoking 04/22/21  -  09/25/2020   breztri trial  - 11/07/2020 says has 2 full breztri's at home but brought in empty one - 12/05/2020  After extensive coaching inhaler device,  effectiveness =    75%  So given 4 weeks of breztri samples pending return for pfts  - 12/05/2020   Walked on RA x one lap =  approx 250 ft -@ slow/rollator pace stopped due to sob with sats still 99%  - PFT's  01/02/21  FEV1 1.33 (109 % ) ratio 0.70  p 0 % improvement from saba p ? breztri ? saba (pt not sure) prior to study with DLCO  7.71 (48%) corrects to 2.66 (62%)  for alv volume and FV curve very minimal concavity - try off breztri 01/02/2021 >>>  Admit  06/15/21 > retry 07/23/2021  - 07/23/2021  After extensive coaching inhaler device,  effectiveness =   75% > continue breztri and prn saba    Group D in terms of symptom/risk and laba/lama/ICS  therefore appropriate rx at this point >>>  Continue breztri and prn saba - now that not smoking should do much better as she never got beyond GOLD 0 criteria.  Re SABA :  I spent extra time with pt today reviewing appropriate use of albuterol for prn use on exertion with the following points: 1) saba is for relief of sob that does not improve by walking a slower pace or resting but rather if the pt does not improve after trying this first. 2) If the pt is convinced, as many are, that saba helps recover from activity faster then it's easy to tell if this is the case by re-challenging : ie stop, take the inhaler, then p 5 minutes try the exact same activity (intensity of workload) that just caused the symptoms and see if they are substantially diminished or not after saba 3) if there is an activity that reproducibly causes the symptoms, try the saba 15 min before the activity on alternate days   If in fact the saba really does help, then fine to continue to use it prn but advised may need to look closer at the maintenance regimen being used to achieve better control of airways disease  with exertion.          Each maintenance medication was reviewed in detail including emphasizing most importantly the difference between maintenance and prns and under what circumstances the prns are to be triggered using an action plan format where appropriate.  Total time for H and P, chart review, counseling, reviewing hfa device(s) and generating customized AVS unique to this post hosp/ transition of care office visit / same day charting = 34 min

## 2021-07-23 NOTE — Progress Notes (Signed)
Alyssa [name], female    DOB: 10/01/1941     MRN: [id number]   Brief patient profile:  48 yobf former smoker (quit 04/2021)   With onset doe in 22L  With diastolic dysfunction and CRI assoc with chronic met acidosis and no spirometry on record then admit:     Admit date: 07/06/2020 Discharge date: 07/10/2020   Admitted From: Home Discharge disposition: Home   Code Status: Full Code  Diet Recommendation: Cardiac diet   Discharge Diagnosis:    Principal Problem:   Metabolic acidosis Active Problems:   Shortness of breath   PVD (peripheral vascular disease) (HCC)   CKD (chronic kidney disease) stage 4, GFR 15-29 ml/min (HCC)   Anemia   Protein-calorie malnutrition, severe (HCC)   Essential hypertension   Abdominal pain   Elevated INR     History of Present Illness / Brief narrative:  Patient is a 81 year old female history of CKD stage IV, hypertension, PAD who was admitted with COVID-19 and acute kidney injury last month. Since discharge patient has been experiencing progressively worsening shortness of breath.  9/19, she had shortness of breath, fever chills and rigors EMS was called.    EMS noted a temperature of 100.8.   In the ED, patient had a temperature of 99, tachycardic to 120, respiratory 25, blood pressure 160/102 Labs with lactate level of 3.2, serum bicarbonate level 9, anion gap 15 Chest x-ray did not show any acute infiltrates. Blood gas showed normal pH of 7.4, PCO2 low at 19, serum bicarb low at 12 Urinalysis showed large leukocyte esterase, negative nitrates, 6-10 WBCs, negative for blood.   Patient was admitted to hospitalist service for further evaluation management.   Subjective:  Seen and examined this morning. Pleasant elderly [name].  Lying down in bed.  Not in distress. No fever last 48 hours.  Blood pressure in 160s.  Breathing comfortably on room air.  Feels ready to go home today.   Hospital Course:  Acute on chronic  metabolic acidosis with respiratory compensation -Patient has chronic metabolic acidosis and was supposed to be on sodium bicarbonate tablet which apparently she was not compliant to. -She presented with a low bicarbonate level of 9.  Blood gas showed PCO2 level at 19. -Patient probably was hyperventilating to generate respiratory compensation for acute worsening of metabolic acidosis. -Pulmonary embolism was ruled out by a negative VQ scan. -Nephrology consult appreciated.  She was started on bicarbonate drip and subsequently switched to resume sodium bicarbonate tablets 1300 mg twice daily. -Continue to follow-up with nephrology as an outpatient   Sepsis ruled out -Initially suspected of sepsis because of respiratory distress. -Chest x-ray and CT imaging did not show any new infiltrates in lungs. -Sepsis ruled out now.  Antibiotics stopped.   Falls/tremors -Patient reports tremors and intermittent falls at home. -9/22, MRI brain was obtained which did not show any evidence of acute intracranial abnormality.  Chronic microvascular ischemic changes were noted. -PT evaluation was obtained.  Patient is functioning as ambulating independently. -Previous hospitalist switched her from Coreg to propanolol which we will continue at discharge.   Hypertension -Currently on clonidine, amlodipine and propanolol.  Blood pressure remains elevated to 160s today.  I would increase amlodipine from 2.5 mg to 5 mg daily at discharge.   CKD stage IV -Creatinine fluctuating but remains at baseline. -Follow-up with nephrology as an outpatient. 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 -Continue iron supplement at home. Recent Labs (within last 365 days)              Recent Labs    06/03/20 0413 06/03/20 1421 06/04/20 0251 06/05/20 0337 06/06/20 0628 06/07/20 0651 06/09/20 1604 07/06/20 1538 07/06/20 2147 07/10/20 0137  HGB 8.6* 8.2* 7.6* 7.1* 6.7* 9.2* 9.6* 9.1* 7.8* 8.5*      COVID-19 pneumonia 06/07/20 transiently needed 02 inpt only/ had been vaccinated prior -No residual symptoms.      History of Present Illness  09/25/2020  Pulmonary/ 1st office eval/Alyssa [name]  Chief Complaint  Patient presents with   Consult    Shortness of breath with activity and sometimes when resting  Dyspnea:  25 ft with rollator for a year  Cough: none  Sleep: props up one one pillow/ bed is flat SABA use: couple times a day seems to help rec Stop propranolol and start bisoprolol 10 mg daily  Plan A = Automatic = Always=    Breztri Take 2 puffs first thing in am and then another 2 puffs about 12 hours later.  Work on inhaler technique: Plan B = Backup (to supplement plan A, not to replace it) Only use your albuterol inhaler as a rescue medication The key is to stop smoking completely before smoking completely stops you! Please schedule a follow up office visit in 4 weeks, sooner if needed with pfts     11/07/2020  f/u ov/Alyssa [name] re: copd clinical dx/ vaccinated and boosted Chief Complaint  Patient presents with   Follow-up    Breathing has improved slightly since the last visit. She is using her albuterol inhaler 3 x per wk on average.    COPD  GOLD ? still smoking  Active smoker -  09/25/2020  After extensive coaching inhaler device,  effectiveness =    75% from baseline 25 % > breztri trial  Essential hypertension Changed propranolol to bisoprolol 09/25/2020 > did not do  Dyspnea:  50 ft with rollator  Cough: none  Sleeping: bed is flat/ one pillow  SABA use: as above  02: none and does not check at home  rec No change in medications Breztri  Take 2  puffs first thing in am and then another 2 puffs about 12 hours later.  - 2 at home and 2 samples given today  The key is to stop smoking completely before smoking completely stops you! Make sure you check your oxygen saturation  at your highest level of activity Please schedule a follow up office visit in 4 weeks, sooner if needed  with all medications /inhalers/ solutions in hand so we can verify exactly what you are taking. This includes all medications from all doctors and over the counters      12/05/2020  f/u ov/Alyssa [name] re:  COPD / still smoking / breztri  Chief Complaint  Patient presents with   Follow-up    Breathing is doing better since the last visit. She has been wheezing more over the past few wks and has had minimal cough-non prod. She is using her albuterol inhaler 4 x per day.   Dyspnea:  No change  doe =across the room  Cough: not much Sleeping: bed is flat, one pillow  SABA use: way too much/ very poor insight about how/when to use   02: none  Covid status:  vax x 3  rec Plan A = Automatic = Always=    breztri Take 2 puffs first thing in am and then another 2 puffs about 12 hours later.  Work on inhaler technique:  Plan B = Backup (to supplement plan A, not to replace it) Only use your albuterol inhaler as a rescue medication  Plan C = Crisis (instead of Plan B but only if Plan B stops working) - only use your albuterol nebulizer if you first try Plan B a  key is to stop smoking completely before smoking completely stops you! PFT's on return / has #16 on breztri sample, given 4 more weeks Please schedule a follow up office visit in 4 weeks, call sooner if needed with all medications /inhalers/ solutions in hand so we can verify exactly what you are taking. This includes all medications from all doctors and over the [place] separate them into two bags:  the ones you take automatically, no matter what, vs the ones you take just when you feel you need them "BAG #2 is  UP TO YOU"  - this will really help [place] help you take your medications more effectively.    01/02/2021  f/u ov/Alyssa [name] re: copd 0, brought no meds, not clear using breztri 2bid  Chief Complaint  Patient presents with   Follow-up    PFT done today. Breathing is doing well today. She still has some wheezing. She is using her rescue inhaler about every 4 hours.   Dyspnea:  Room to room about same on vs off breztri /no change in saba dependency Cough: none  Sleeping: flat bed/ one pillow  SABA use: way too much hfa and neb / never pre or rechallenges  02: none  Covid status:   vax x 3  Rec Try albuterol (one day the inhaler the next day the neb and the next day nothing) use 15 min before an activity. Stop breztri and see if breathing gets worse or need more  albuterol and restart if it does @ Take 2 puffs first thing in am and then another 2 puffs about 12 hours later.  The key is to stop smoking completely before smoking completely stops you! Pulmonary follow is as needed  -  you must bring all medication/ nebulizer / inhalers with you that you are actually using    Admit date: 06/15/2021 Discharge date: 07/10/2021   Admitted From: [facility] Disposition:  [facility]       Home Health:no  Equipment/Devices: none   Discharge Condition: Stable Code Status:   Code Status: DNR     Brief/Interim Summary: 80 year old female with hypertension, HLD, COPD, PVD, CKD stage IV, COVID-19 infection in 2021, dementia presents with pain all over x3 days along with a rash.  She was seen in the ED creatinine 3.1, troponin 31 chest x-ray left lower lobe collapse COVID-19 negative CT of the chest abdomen pelvis showed left lower lobe collapse without visible mass or adenopathy and femoral bypass graft with increased fluid collection and bypass anastomosis measuring 4.8 X4.1 previously 2.6 X2.6 Patient was given valacyclovir IV fluids and admitted for further management for dyspnea due to left lung collapse,  hypertension urgency, weight loss, borderline elevated troponin, nongap metabolic acidosis, anemia.   Patient was managed with Valtrex,  pain control supplemental oxygen. Seen by PT OT and skilled nursing facility recommended. Pain medication adjusted for pain control ,IR consulted but unable to do nerve block. 9/12-anesthesia was able to inject ropivacaine at T11 level-with some improvement in her pain 9/13-antibiotic were changed to Zosyn -WBC count downtrending. Mild confusion-??  History of dementia may have MCI per son was not diagnosed dementia previously.  Suspecting delirium, CT head 9/14 no acute finding. Patient had fecal impaction constipation that improved with enema since then her abdomen pain is stable 9/16-started weaning down on her scheduled oxycodone, hemoglobin was low 6.5 g needing 1 unit PRBC transfusion. 9/19 patient was noted to have a rectal bleeding seen by GI-suspected and a rectal bleeding with hemorrhoids stercoral ulcer-and per GI colonoscopy would not be helpful and managed with lidocaine jelly as needed HC suppository 2.5% and resume Plavix/asa 9/20. Patient at this time remained stable afebrile on room air, kidney function stable with CKD creatinine 1.9 hemoglobin 7.9.  Patient Was denied SNF level [facility] but she is going back to long-term care at [facility].     Discharge Diagnoses:  Left lower lung collapse/chest to splinting Acute hypoxic respiratory failure 2/2 above Chronic COPD-compensated Continue pain control, bronchodilators, as needed supplemental oxygen outpatient pulmonary follow-up w/ repeat CT chest  in a month-if still not reexpanded then may need inspection bronchoscopy.At risk of developing pneumonia.   Acute Shingles/herpes zoster viral involving up to T11 dermatome(Left flank and abdomen): completed Valtrex.  Rash saline healing, no more on isolation.  Pain management as per palliative care.  Completed oral steroid 9/17. s/p  rupivacaine injection at T11 on 9/12 following which pain significantly better.  Continue oxycodone  5 mg Q8 HR-we will continue to wean down as tolerated, peviously was on 10 mg.  Changed to as needed oxygen on discharge continue Cymbalta, Pepcid, and Lyrica, lidocaine patch.   Mild confusion-??  History of dementia may have MCI per son was not diagnosed dementia previously.  Suspecting delirium, CT head 9/14 no acute finding.  UA grossly abnormal but already covered with antibiotics- possible UTI.  Is at baseline.  Mentation   Rectal bleeding 9/19 a.m: following rectal impaction/constipation needing enema several days ago. Seen by GI-suspected  rectal bleeding with hemorrhoids stercoral ulcer-and per GI colonoscopy would not be helpful and managed with lidocaine jelly prn, HC suppository 2.5%.  No recurrence, tolerating aspirin/Plavix.  GI signed off.  Overall stable.  Monitor closely.   Constipation/fecal impaction: Resolved with enema.holding off on MiraLAX having bowel movement.  We will add a stool softener    Hypertension urgency: Stable continue amlodipine, Coreg, clonidine.  prn oral hydralazine.   CKD stage IV Metabolic  acidosis Hyperkalemia: previous creatinine ~2.6-2.9.renal function improved and stable. off IV fluids, potassium on higher range she will continue with Lokelma oral bicarb and monitor BMP closely  Last Labs          Recent Labs  Lab 07/04/21 0311 07/05/21 0521 07/07/21 0451 07/08/21 0342 07/09/21 0411  BUN 38* 35* 31* 31* 32*  CREATININE 2.81* 2.53* 2.08* 2.08* 1.93*       Last Labs          Recent Labs  Lab 07/04/21 0311 07/05/21 0521 07/07/21 0451 07/08/21 0342 07/09/21 0411  K 4.5 3.7 4.5 5.0 4.8      PVD- Fem fem bypass  in 2011/CT on admission showing fluid collection from pseudoaneurysm, cannot Dr. 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  Cont on  Plavix aspirin-statin.   History of tobacco abuse-has quit few months ago Moderate protein calorie malnutrition: Augment diet as tolerated-change from renal to regular diet to liberalize diet.  RD following   Deconditioning: SNF has been recommended by PT OT and is deconditioned and weak respiratory admission     Anemia of chronic kidney disease:Hb down trended 6.5 gm ?? from hemodilution from IV fluids, s/p 1 unit PRBC ,H&H stable.  But slightly downtrending this morning.  Monitor  Last Labs          Recent Labs  Lab 07/06/21 1625 07/07/21 0451 07/07/21 0922 07/08/21 0342 07/09/21 0411  HGB 7.9* 8.9* 8.9* 8.0* 7.9*  HCT 23.5* 26.1* 26.6* 23.9* 23.3*    Depression with anxiety-stable mood   Goals of care: DNR, seen by palliative care.    07/23/2021  f/u ov/Alyssa [name] re: GOLD 0 / AB    maint on just saba tid  Chief Complaint  Patient presents with   Follow-up    Patient was admitted into hospital in August. She states that she is doing good and feels much better. Having shortness of breath with exertion   Dyspnea:  50 ft flat with rollator  Cough: none  Sleeping: no resp cc flat/ one pillow SABA use: hfa too much  02: none  Covid status:   vax x 3 never infected    No obvious day to day or daytime variability or assoc excess/ purulent sputum or mucus plugs or hemoptysis or cp or chest tightness, subjective wheeze or overt sinus or hb symptoms.   Sleeping  without nocturnal  or early am exacerbation  of respiratory  c/o's or need for noct saba. Also denies any obvious fluctuation of symptoms with weather or environmental changes or other aggravating or alleviating factors except as outlined above   No unusual exposure hx or h/o childhood pna/ asthma or knowledge of premature birth.  Current Allergies, Complete Past Medical History, Past Surgical History, Family History, and Social History were reviewed in [employer] record.  ROS  The following are not active complaints unless bolded Hoarseness, sore throat, dysphagia, dental problems, itching, sneezing,  nasal congestion or discharge of excess mucus or purulent secretions, ear ache,   fever, chills, sweats, unintended wt loss or wt gain, classically pleuritic or exertional cp,  orthopnea pnd or arm/hand swelling  or leg swelling, presyncope, palpitations, abdominal pain, anorexia, nausea, vomiting, diarrhea  or change in bowel habits or change in bladder habits, change in stools or change in urine, dysuria, hematuria,  rash, arthralgias, visual complaints, headache, numbness, weakness or ataxia or problems with walking= uses rollator or coordination,  change in mood or  memory.        Current Meds  Medication Sig   acetaminophen (TYLENOL) 325 MG tablet Take 2 tablets (650 mg total) by mouth every 6 (six) hours as needed for mild pain, moderate pain or fever (or Fever >/= 101). (Patient taking differently: Take 650 mg by mouth every 6 (six) hours as needed for mild pain.)   albuterol (VENTOLIN HFA) 108 (90 Base) MCG/ACT inhaler Inhale 2 puffs into the lungs every 4 (four) hours as needed for wheezing or shortness of breath.    amLODipine (NORVASC) 10 MG tablet Take 1 tablet (10 mg total) by mouth at bedtime. (Patient taking differently: Take 10 mg by mouth every  morning.)   aspirin 81 MG EC tablet Take 1 tablet (81 mg total) by mouth daily.   Budeson-Glycopyrrol-Formoterol (BREZTRI AEROSPHERE) 160-9-4.8 MCG/ACT AERO Inhale 2 puffs into the lungs 2 (two) times daily.   carvedilol (COREG) 12.5 MG tablet Take 12.5 mg by mouth 2 (two) times daily.   cloNIDine (CATAPRES) 0.2 MG tablet Take 0.2 mg by mouth at bedtime.   clopidogrel (PLAVIX) 75 MG tablet Take 1 tablet (75 mg total) by mouth daily.   diclofenac Sodium (VOLTAREN) 1 % GEL Apply 2 g topically every 6 (six) hours as needed (knee pain).   dicyclomine (BENTYL) 20 MG tablet Take 20 mg by mouth 3  (three) times daily.   docusate sodium (COLACE) 100 MG capsule Take 1 capsule (100 mg total) by mouth daily.   donepezil (ARICEPT) 5 MG tablet Take 1 tablet (5 mg total) by mouth daily. (Patient taking differently: Take 5 mg by mouth at bedtime.)   DULoxetine 40 MG CPEP Take 40 mg by mouth daily.   Ensure Plus (ENSURE PLUS) LIQD Take by mouth 2 (two) times daily.   Ferrous Sulfate (IRON HIGH-POTENCY) 325 MG TABS Take 325 mg by mouth daily with breakfast.   hydrocortisone (ANUSOL-HC) 25 MG suppository Place 1 suppository (25 mg total) rectally 2 (two) times daily.   hydrOXYzine (ATARAX/VISTARIL) 25 MG tablet Take 25 mg by mouth at bedtime.   Lidocaine 4 % PTCH Place 2 patches onto the skin See admin instructions. Apply 2 patches transdermally to both knees every morning for pain, remove every evening   magnesium oxide (MAG-OX) 400 MG tablet Take 400 mg by mouth 2 (two) times daily.   megestrol (MEGACE) 40 MG tablet Take 40 mg by mouth 2 (two) times daily.   Multiple Vitamin (MULTIVITAMIN WITH MINERALS) TABS tablet Take 1 tablet by mouth daily.   nitroGLYCERIN (NITROSTAT) 0.4 MG SL tablet Place 1 tablet (0.4 mg total) under the tongue every 5 (five) minutes as needed for chest pain.   ondansetron (ZOFRAN ODT) 4 MG disintegrating tablet Take 1 tablet (4 mg total) by mouth every 4 (four) hours as needed for nausea or vomiting.   oxyCODONE (OXY IR/ROXICODONE) 5 MG immediate release tablet Take 1 tablet (5 mg total) by mouth every 8 (eight) hours as needed for up to 10 doses for moderate pain.   pantoprazole (PROTONIX) 40 MG tablet Take 1 tablet (40 mg total) by mouth daily.   pregabalin (LYRICA) 75 MG capsule Take 1 capsule (75 mg total) by mouth daily.   sodium bicarbonate 650 MG tablet Take 2 tablets (1,300 mg total) by mouth 2 (two) times daily.   sodium zirconium cyclosilicate (LOKELMA) 5 g packet Take 5 g by mouth daily. (Patient taking differently: Take 5 g by mouth daily. Mix with 4-8 oz water  prior to administration)   Vitamin D, Ergocalciferol, (DRISDOL) 1.25 MG (50000 UNIT) CAPS capsule Take 50,000 Units by mouth every Monday.               Past Medical History:  Diagnosis Date   Acute respiratory failure with hypoxia (HCC) 11/01/2019   Anxiety    Arthritis    Chest pain    Chronic kidney disease    Dyspnea    Elevated troponin 12/13/2018   Headache(784.0)    Hyperlipidemia    Hypertension    Joint pain    Leg pain    Peripheral neuropathy    Peripheral vascular disease (HCC)       Objective:  07/23/2021         97  01/02/2021        108 12/05/2020        109   11/07/20 103 lb (46.7 kg)  10/17/20 99 lb 6.4 oz (45.1 kg)  10/02/20 100 lb 12 oz (45.7 kg)     Vital signs reviewed  07/23/2021  - Note at rest 02 sats  98% on RA   General appearance:    elderly somber bf walks with rollator   HEENT : pt wearing mask not removed for exam due to covid -19 concerns.    NECK :  without JVD/Nodes/TM/ nl carotid upstrokes bilaterally   LUNGS: no acc muscle use,  Mod barrel  contour chest wall with bilateral  Distant bs s audible wheeze and  without cough on insp or exp maneuvers and mod  Hyperresonant  to  percussion bilaterally     CV:  RRR  no s3 or murmur or increase in P2, and no edema   ABD:  soft and nontender with pos mid insp Hoover's  in the supine position. No bruits or organomegaly appreciated, bowel sounds nl  MS:     ext warm without deformities, calf tenderness, cyanosis or clubbing No obvious joint restrictions   SKIN: warm and dry without lesions    NEURO:  alert, approp, nl sensorium with  no motor or cerebellar deficits apparent.           I personally reviewed images and agree with radiology impression as follows:   Chest CT s contrast 07/22/21  1. Minimal residual periaortic atelectasis in the medial left lower lobe. Left lower lobe collapse has otherwise resolved. 2. Emphysema (ICD10-J43.9). No additional findings to explain  the patient's shortness of breath.     Assessment

## 2021-07-23 NOTE — Patient Instructions (Addendum)
Plan A = Automatic = Always=    Breztri Take 2 puffs first thing in am and then another 2 puffs about 12 hours later.  Work on inhaler technique:  relax and gently blow all the way out then take a nice smooth full deep breath back in, triggering the inhaler at same time you start breathing in.  Hold for up to 5 seconds if you can. Blow out thru nose. Rinse and gargle with water when done.  If mouth or throat bother you at all,  try brushing teeth/gums/tongue with arm and hammer toothpaste/ make a slurry and gargle and spit out.    Plan B = Backup (to supplement plan A, not to replace it) Only use your albuterol inhaler as a rescue medication to be used if you can't catch your breath by resting or doing a relaxed purse lip breathing pattern.  - The less you use it, the better it will work when you need it. - Ok to use the inhaler up to 2 puffs  every 4 hours if you must but call for appointment if use goes up over your usual need - Don't leave home without it !!  (think of it like the spare tire for your car)      Call me if your insurance doesn't cover breztri with alternatives (drug formulary)    Please schedule a follow up visit in 3 months but call sooner if needed

## 2021-07-24 ENCOUNTER — Encounter: Payer: Self-pay | Admitting: Internal Medicine

## 2021-07-24 ENCOUNTER — Other Ambulatory Visit: Payer: Self-pay | Admitting: Neurology

## 2021-07-25 ENCOUNTER — Encounter (HOSPITAL_COMMUNITY): Payer: Self-pay | Admitting: Emergency Medicine

## 2021-07-25 ENCOUNTER — Emergency Department (HOSPITAL_COMMUNITY): Payer: Medicare HMO

## 2021-07-25 ENCOUNTER — Other Ambulatory Visit: Payer: Self-pay

## 2021-07-25 ENCOUNTER — Emergency Department (HOSPITAL_COMMUNITY)
Admission: EM | Admit: 2021-07-25 | Discharge: 2021-07-25 | Disposition: A | Discharge status: Home / Self Care | Payer: Medicare HMO | Attending: Emergency Medicine | Admitting: Emergency Medicine

## 2021-07-25 DIAGNOSIS — R0789 Other chest pain: Secondary | ICD-10-CM | POA: Diagnosis present

## 2021-07-25 DIAGNOSIS — I2510 Atherosclerotic heart disease of native coronary artery without angina pectoris: Secondary | ICD-10-CM | POA: Insufficient documentation

## 2021-07-25 DIAGNOSIS — Z79899 Other long term (current) drug therapy: Secondary | ICD-10-CM | POA: Diagnosis not present

## 2021-07-25 DIAGNOSIS — Z87891 Personal history of nicotine dependence: Secondary | ICD-10-CM | POA: Insufficient documentation

## 2021-07-25 DIAGNOSIS — Z96643 Presence of artificial hip joint, bilateral: Secondary | ICD-10-CM | POA: Diagnosis not present

## 2021-07-25 DIAGNOSIS — N184 Chronic kidney disease, stage 4 (severe): Secondary | ICD-10-CM | POA: Diagnosis not present

## 2021-07-25 DIAGNOSIS — M169 Osteoarthritis of hip, unspecified: Secondary | ICD-10-CM | POA: Insufficient documentation

## 2021-07-25 DIAGNOSIS — M545 Low back pain, unspecified: Secondary | ICD-10-CM | POA: Insufficient documentation

## 2021-07-25 DIAGNOSIS — Z7982 Long term (current) use of aspirin: Secondary | ICD-10-CM | POA: Insufficient documentation

## 2021-07-25 DIAGNOSIS — J449 Chronic obstructive pulmonary disease, unspecified: Secondary | ICD-10-CM | POA: Diagnosis not present

## 2021-07-25 DIAGNOSIS — R5381 Other malaise: Secondary | ICD-10-CM | POA: Diagnosis not present

## 2021-07-25 LAB — BASIC METABOLIC PANEL
Anion gap: 11 (ref 5–15)
BUN: 41 mg/dL — ABNORMAL HIGH (ref 8–23)
CO2: 20 mmol/L — ABNORMAL LOW (ref 22–32)
Calcium: 9.3 mg/dL (ref 8.9–10.3)
Chloride: 110 mmol/L (ref 98–111)
Creatinine, Ser: 2.83 mg/dL — ABNORMAL HIGH (ref 0.44–1.00)
GFR, Estimated: 17 mL/min — ABNORMAL LOW (ref >60)
Glucose, Bld: 113 mg/dL — ABNORMAL HIGH (ref 70–99)
Potassium: 4.8 mmol/L (ref 3.5–5.1)
Sodium: 141 mmol/L (ref 135–145)

## 2021-07-25 LAB — CBC
HCT: 26.3 % — ABNORMAL LOW (ref 36.0–46.0)
Hemoglobin: 8.4 g/dL — ABNORMAL LOW (ref 12.0–15.0)
MCH: 27 pg (ref 26.0–34.0)
MCHC: 31.9 g/dL (ref 30.0–36.0)
MCV: 84.6 fL (ref 80.0–100.0)
Platelets: 693 10*3/uL — ABNORMAL HIGH (ref 150–400)
RBC: 3.11 MIL/uL — ABNORMAL LOW (ref 3.87–5.11)
RDW: 18.6 % — ABNORMAL HIGH (ref 11.5–15.5)
WBC: 9.9 10*3/uL (ref 4.0–10.5)
nRBC: 0.2 % (ref 0.0–0.2)

## 2021-07-25 LAB — TROPONIN I (HIGH SENSITIVITY)
Troponin I (High Sensitivity): 46 ng/L — ABNORMAL HIGH (ref <18)
Troponin I (High Sensitivity): 58 ng/L — ABNORMAL HIGH (ref <18)

## 2021-07-25 MED ORDER — ACETAMINOPHEN 325 MG PO TABS
650.0000 mg | ORAL_TABLET | Freq: Once | ORAL | Status: AC
  Administered 2021-07-25: 650 mg via ORAL
  Filled 2021-07-25: qty 2

## 2021-07-25 NOTE — ED Notes (Signed)
Blue top, 1gold, lavendar and lt green sent to lab

## 2021-07-25 NOTE — ED Triage Notes (Signed)
Pt to triage via [facility] from home.  Reports chest pain and SOB since 3pm yesterday.  Family states pt lethargic but pt alert and oriented per EMS.  22g LAC.  NS 200cc.  ASA 324mg  and NTG x 1.

## 2021-07-25 NOTE — ED Provider Notes (Signed)
[facility] EMERGENCY DEPARTMENT Provider Note   CSN: [id number] Arrival date & time: 07/25/21  1352     History Chief Complaint  Patient presents with   Chest Pain    Alyssa [name] is a 80 y.o. female.  HPI She presents by EMS from home for evaluation of both left hip and chest pain.  She is a vague historian.  Apparently left hip pain has been going on since she had shingles.  The area affected by the shingles was actually the left lumbar region.  She states she fell couple times last week while walking with her walker.  She notes chest discomfort today.  She lives alone and has family members check on her sometimes.  She denies fever, chills, cough, nausea or vomiting.  She uses albuterol for trouble breathing and saw her pulmonologist, 2 days ago.  He has to show to stop taking Breztri, as a trial to see if it made the breathing worse.  He told her that it was important to stop smoking completely.  He did not make any other changes in her treatment plan.  There are no other known modifying factors    Past Medical History:  Diagnosis Date   Acute respiratory failure with hypoxia ([place]) 11/01/2019   Anxiety    Arthritis    Chest pain    Chronic kidney disease    Dyspnea    Elevated troponin 12/13/2018   Headache(784.0)    Hyperlipidemia    Hypertension    Joint pain    Leg pain    Peripheral neuropathy    Peripheral vascular disease ([place])    Syncope 09/2020    Patient Active Problem List   Diagnosis Date Noted   Rectal bleeding    Rectal pain    Malnutrition of moderate degree 06/17/2021   Weight loss 06/16/2021   Zoster 06/16/2021   Collapsed lung    COPD  GOLD 0/ still smoking  09/26/2020   Cigarette smoker 09/26/2020   Shortness of breath 07/06/2020   Essential hypertension 07/06/2020   Abdominal pain 07/06/2020   Elevated INR 07/06/2020   Acute respiratory failure due to COVID-19 ([place]) 06/02/2020   Acute renal failure superimposed on  chronic kidney disease ([place]) 02/06/2020   Leukocytosis 02/06/2020   Thrombocytosis 02/06/2020   Palliative care encounter    Constipation 01/05/2020   Protein-calorie malnutrition, severe ([place]) 01/04/2020   Acute on chronic renal failure ([place]) 01/04/2020   Hyperkalemia 01/03/2020   AKI (acute kidney injury) ([place]) 01/03/2020   Acute respiratory failure with hypoxia ([place]) 11/01/2019   UTI (urinary tract infection) 11/01/2019   CAP (community acquired pneumonia) 11/01/2019   Acute cystitis without hematuria    Encephalopathy 09/18/2019   SAH (subarachnoid hemorrhage) ([place]) 09/18/2019   Dementia ([place]) 86/76/7209   Metabolic acidosis 47/06/6282   ARF (acute renal failure) ([place]) 09/17/2019   Acute encephalopathy 09/17/2019   Abnormal CT scan, colon    Diarrhea    Diverticulitis 06/07/2019   Depression with anxiety 06/07/2019   DNR (do not resuscitate) 03/23/2019   Volume overload 03/23/2019   Edema 03/23/2019   Acute renal failure (ARF) ([place]) 03/08/2019   Hypokalemia 03/08/2019   Palliative care by specialist    Goals of care, counseling/discussion    Altered mental status    Encephalopathy, toxic 12/24/2018   Aspiration pneumonia of both lower lobes due to gastric secretions ([place]) 12/24/2018   Non-ST elevation (NSTEMI) myocardial infarction [facility])    Chest pain  Dyspnea on exertion    Elevated troponin level 12/09/2018   Chronic migraine 03/27/2018   Lumbar radiculopathy 03/27/2018   Gait abnormality 03/27/2018   Hypertensive urgency 12/28/2016   CKD (chronic kidney disease) stage 4, GFR 15-29 ml/min (HCC) 12/28/2016   Anemia 12/28/2016   Chronic pain syndrome 12/28/2016   Benzodiazepine withdrawal without complication ([place]) 88/41/6606   Chronic right shoulder pain 12/06/2016   Fall 09/17/2016   Fracture of humeral head, closed, right, initial encounter 09/17/2016   Rib fractures 09/17/2016   Multiple falls 09/17/2016   Severe muscle deconditioning 09/17/2016   Failure  to thrive in adult 09/17/2016   Melena 04/25/2016   Transient hypotension 04/25/2016   Anxiety state 04/25/2016   Tobacco abuse 04/25/2016   Hyperlipidemia 04/25/2016   Syncope 04/24/2016   Near syncope 04/24/2016   Gait difficulty 01/12/2016   Foot drop, left 01/12/2016   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 CATH LAB;  Service: Cardiovascular;  Laterality: N/A;   BACK SURGERY     COLONOSCOPY     ESOPHAGOGASTRODUODENOSCOPY     FEMORAL-FEMORAL BYPASS GRAFT  08/31/2010   FLEXIBLE SIGMOIDOSCOPY N/A 06/12/2019   Procedure: FLEXIBLE SIGMOIDOSCOPY;  Surgeon: Ladene Artist, MD;  Location: [place];  Service: Endoscopy;  Laterality: N/A;  Patient had little bit to eat this morning so this will be unsedated.   JOINT REPLACEMENT Right 10/18/2010   Hip   LOWER EXTREMITY ANGIOGRAPHY  06/14/2017   Procedure: Lower Extremity Angiography;  Surgeon: Adrian Prows, MD;  Location: [place] CV LAB;  Service: Cardiovascular;;  Bilateral limited angio performed   RENAL ANGIOGRAPHY N/A 06/14/2017   Procedure: Renal Angiography;  Surgeon: Adrian Prows, MD;  Location: [place] CV LAB;  Service: Cardiovascular;  Laterality: N/A;   TOTAL HIP ARTHROPLASTY     right     OB History   No obstetric history on file.     Family History  Problem Relation Age of Onset   Heart attack Mother    Heart disease Mother    Hyperlipidemia Mother    Hypertension Mother    Heart attack Father     Heart disease Father        Before age 73   Hyperlipidemia Father    Hypertension Father    Cancer Sister        brain tumor   Aneurysm Brother        brain   Colon cancer Neg Hx    Liver cancer Neg Hx     Social History   Tobacco Use   Smoking status: Former    Packs/day: 1.00    Years: 50.00    Pack years: 50.00    Types: Cigarettes    Quit date: 04/22/2021    Years since quitting: 0.2   Smokeless tobacco: Never   Tobacco comments:    Quit 3 months ago 07/23/21 [facility]  Vaping Use   Vaping Use: Never used  Substance Use Topics   Alcohol use: No   Drug use: No    Home Medications Prior  to Admission medications   Medication Sig Start Date End Date Taking? Authorizing Provider  acetaminophen (TYLENOL) 325 MG tablet Take 2 tablets (650 mg total) by mouth every 6 (six) hours as needed for mild pain, moderate pain or fever (or Fever >/= 101). Patient taking differently: Take 650 mg by mouth every 6 (six) hours as needed for mild pain. 06/16/19  Yes Hongalgi, Lenis Dickinson, MD  albuterol (VENTOLIN HFA) 108 (90 Base) MCG/ACT inhaler Inhale 2 puffs into the lungs every 4 (four) hours as needed for wheezing or shortness of breath.    Yes [provider]  amLODipine (NORVASC) 10 MG tablet Take 1 tablet (10 mg total) by mouth at bedtime. Patient taking differently: Take 10 mg by mouth every morning. 07/10/21  Yes Kc, Maren Beach, MD  aspirin 81 MG EC tablet Take 1 tablet (81 mg total) by mouth daily. 01/09/19  Yes Gherghe, Vella Redhead, MD  Budeson-Glycopyrrol-Formoterol (BREZTRI AEROSPHERE) 160-9-4.8 MCG/ACT AERO Inhale 2 puffs into the lungs 2 (two) times daily. 07/23/21  Yes Tanda Rockers, MD  carvedilol (COREG) 12.5 MG tablet Take 12.5 mg by mouth 2 (two) times daily. 09/05/20  Yes [provider]  cloNIDine (CATAPRES) 0.2 MG tablet Take 0.2 mg by mouth at bedtime. 05/25/20  Yes [provider]  clopidogrel (PLAVIX) 75 MG tablet Take 1 tablet (75 mg total) by mouth daily.  01/09/19  Yes Caren Griffins, MD  diclofenac Sodium (VOLTAREN) 1 % GEL Apply 2 g topically every 6 (six) hours as needed (knee pain). 01/15/20  Yes [provider]  dicyclomine (BENTYL) 20 MG tablet Take 20 mg by mouth 3 (three) times daily. 04/01/20  Yes [provider]  docusate sodium (COLACE) 100 MG capsule Take 1 capsule (100 mg total) by mouth daily. 07/11/21  Yes Kc, Maren Beach, MD  donepezil (ARICEPT) 5 MG tablet Take 1 tablet (5 mg total) by mouth daily. Patient taking differently: Take 5 mg by mouth at bedtime. 11/20/20  Yes Cameron Sprang, MD  DULoxetine 40 MG CPEP Take 40 mg by mouth daily. 07/11/21  Yes Antonieta Pert, MD  Ensure Plus (ENSURE PLUS) LIQD Take by mouth 2 (two) times daily.   Yes [provider]  Ferrous Sulfate (IRON HIGH-POTENCY) 325 MG TABS Take 325 mg by mouth daily with breakfast. 06/30/19  Yes Hongalgi, Lenis Dickinson, MD  gabapentin (NEURONTIN) 100 MG capsule Take 200 mg by mouth 2 (two) times daily. 07/23/21  Yes [provider]  hydrOXYzine (ATARAX/VISTARIL) 25 MG tablet Take 25 mg by mouth at bedtime. 05/12/20  Yes [provider]  magnesium oxide (MAG-OX) 400 MG tablet Take 400 mg by mouth 2 (two) times daily.   Yes [provider]  megestrol (MEGACE) 40 MG tablet Take 40 mg by mouth 2 (two) times daily. 05/12/20  Yes [provider]  Multiple Vitamin (MULTIVITAMIN WITH MINERALS) TABS tablet Take 1 tablet by mouth daily. 06/17/19  Yes Hongalgi, Lenis Dickinson, MD  nitroGLYCERIN (NITROSTAT) 0.4 MG SL tablet Place 1 tablet (0.4 mg total) under the tongue every 5 (five) minutes as needed for chest pain. 01/07/20  Yes Allie Bossier, MD  ondansetron (ZOFRAN ODT) 4 MG disintegrating tablet Take 1 tablet (4 mg total) by mouth every 4 (four) hours as needed for nausea or vomiting. 06/09/20  Yes Charlesetta Shanks, MD  pantoprazole (PROTONIX) 40 MG tablet Take 1 tablet (40 mg total) by mouth daily. 06/16/19  Yes Hongalgi, Lenis Dickinson, MD   pregabalin (LYRICA) 75 MG capsule  Take 1 capsule (75 mg total) by mouth daily. 07/11/21 08/10/21 Yes Antonieta Pert, MD  sodium bicarbonate 650 MG tablet Take 2 tablets (1,300 mg total) by mouth 2 (two) times daily. 01/07/20  Yes Allie Bossier, MD  Vitamin D, Ergocalciferol, (DRISDOL) 1.25 MG (50000 UNIT) CAPS capsule Take 50,000 Units by mouth every Monday.   Yes [provider]  Budeson-Glycopyrrol-Formoterol (BREZTRI AEROSPHERE) 160-9-4.8 MCG/ACT AERO Inhale 2 puffs into the lungs 2 (two) times daily. Patient not taking: No sig reported 07/23/21   Tanda Rockers, MD  hydrocortisone (ANUSOL-HC) 25 MG suppository Place 1 suppository (25 mg total) rectally 2 (two) times daily. Patient not taking: No sig reported 07/10/21   Antonieta Pert, MD  oxyCODONE (OXY IR/ROXICODONE) 5 MG immediate release tablet Take 1 tablet (5 mg total) by mouth every 8 (eight) hours as needed for up to 10 doses for moderate pain. Patient not taking: Reported on 07/25/2021 07/10/21   Antonieta Pert, MD  sodium zirconium cyclosilicate (LOKELMA) 5 g packet Take 5 g by mouth daily. Patient not taking: Reported on 07/25/2021 07/10/21   Antonieta Pert, MD    Allergies    Patient has no known allergies.  Review of Systems   Review of Systems  All other systems reviewed and are negative.  Physical Exam Updated Vital Signs BP (!) 180/94   Pulse 86   Temp 98.1 F (36.7 C) (Oral)   Resp 12   SpO2 94%   Physical Exam Vitals and nursing note reviewed.  Constitutional:      General: She is not in acute distress.    Appearance: She is well-developed. She is not ill-appearing, toxic-appearing or diaphoretic.     Comments: Elderly, frail  HENT:     Head: Normocephalic and atraumatic.     Right Ear: External ear normal.     Left Ear: External ear normal.     Nose: No congestion.     Mouth/Throat:     Mouth: Mucous membranes are moist.     Pharynx: No oropharyngeal exudate.  Eyes:     Conjunctiva/sclera: Conjunctivae normal.      Pupils: Pupils are equal, round, and reactive to light.  Neck:     Trachea: Phonation normal.  Cardiovascular:     Rate and Rhythm: Normal rate and regular rhythm.     Heart sounds: Normal heart sounds.  Pulmonary:     Effort: Pulmonary effort is normal.     Breath sounds: Normal breath sounds.  Abdominal:     General: There is no distension.     Palpations: Abdomen is soft.     Tenderness: There is no abdominal tenderness.  Musculoskeletal:        General: No swelling. Normal range of motion.     Cervical back: Normal range of motion and neck supple.  Skin:    General: Skin is warm and dry.     Comments: Skin changes, left lumbar consistent with prior shingles infection.  Neurological:     Mental Status: She is alert and oriented to person, place, and time.     Cranial Nerves: No cranial nerve deficit.     Sensory: No sensory deficit.     Motor: No abnormal muscle tone.     Coordination: Coordination normal.  Psychiatric:        Mood and Affect: Mood normal.        Behavior: Behavior normal.        Thought Content: Thought content normal.  Judgment: Judgment normal.    ED Results / Procedures / Treatments   Labs (all labs ordered are listed, but only abnormal results are displayed) Labs Reviewed  BASIC METABOLIC PANEL - Abnormal; Notable for the following components:      Result Value   CO2 20 (*)    Glucose, Bld 113 (*)    BUN 41 (*)    Creatinine, Ser 2.83 (*)    GFR, Estimated 17 (*)    All other components within normal limits  CBC - Abnormal; Notable for the following components:   RBC 3.11 (*)    Hemoglobin 8.4 (*)    HCT 26.3 (*)    RDW 18.6 (*)    Platelets 693 (*)    All other components within normal limits  TROPONIN I (HIGH SENSITIVITY) - Abnormal; Notable for the following components:   Troponin I (High Sensitivity) 46 (*)    All other components within normal limits  TROPONIN I (HIGH SENSITIVITY) - Abnormal; Notable for the following  components:   Troponin I (High Sensitivity) 58 (*)    All other components within normal limits    EKG EKG Interpretation  Date/Time:  Saturday July 25 2021 14:14:23 EDT Ventricular Rate:  92 PR Interval:  164 QRS Duration: 80 QT Interval:  366 QTC Calculation: 452 R Axis:   57 Text Interpretation: Normal sinus rhythm Anterior infarct , age undetermined ST & T wave abnormality, consider inferolateral ischemia Abnormal ECG since last tracing no significant change Confirmed by Daleen Bo [phone]) on 07/25/2021 5:55:53 PM  Radiology DG Chest 2 View  Result Date: 07/25/2021 CLINICAL DATA:  Chest pain, hypertension, smoker EXAM: CHEST - 2 VIEW COMPARISON:  07/18/2021 FINDINGS: Enlargement of cardiac silhouette. Atherosclerotic calcification of a tortuous thoracic aorta. Mediastinal contours and pulmonary vascularity otherwise normal. Minimal subsegmental atelectasis at both lung bases. No pulmonary infiltrate, pleural effusion, or pneumothorax. Bones demineralized with chronic RIGHT rotator cuff tear. IMPRESSION: Enlargement of cardiac silhouette with minimal bibasilar atelectasis. Electronically Signed   By: Lavonia Dana M.D.   On: 07/25/2021 15:06   DG Hip Unilat With Pelvis 2-3 Views Left  Result Date: 07/25/2021 CLINICAL DATA:  Left hip pain, extensive postsurgical changes right hip EXAM: DG HIP (WITH OR WITHOUT PELVIS) 2-3V LEFT COMPARISON:  09/17/2016 FINDINGS: Frontal view of the pelvis as well as frontal and frogleg lateral views of the left hip are obtained. There are extensive postsurgical changes of the right hip with right hip arthroplasty and extensive heterotopic ossification again noted. There are no acute displaced fractures. Mild left hip osteoarthritis unchanged. Stable postsurgical changes lower lumbar spine. IMPRESSION: 1. Mild left hip osteoarthritis.  No acute fracture. 2. Right hip arthroplasty with extensive surrounding postsurgical change. Electronically Signed   By:  Randa Ngo M.D.   On: 07/25/2021 19:11    Procedures Procedures   Medications Ordered in ED Medications  acetaminophen (TYLENOL) tablet 650 mg (650 mg Oral Given 07/25/21 1900)    ED Course  I have reviewed the triage vital signs and the nursing notes.  Pertinent labs & imaging results that were available during my care of the patient were reviewed by me and considered in my medical decision making (see chart for details).    MDM Rules/Calculators/A&P                           Patient Vitals for the past 24 hrs:  BP Temp Temp src Pulse Resp SpO2  07/25/21 2015 (!) 180/94 -- -- 86 12 94 %  07/25/21 2000 (!) 162/97 -- -- 81 11 100 %  07/25/21 1945 (!) 169/94 -- -- 77 13 100 %  07/25/21 1930 (!) 168/95 -- -- 79 12 100 %  07/25/21 1915 (!) 179/99 -- -- 79 15 100 %  07/25/21 1900 (!) 174/95 -- -- 84 18 100 %  07/25/21 1830 (!) 176/101 -- -- 86 18 100 %  07/25/21 1815 (!) 166/90 -- -- 85 13 94 %  07/25/21 1800 (!) 173/120 -- -- 83 12 100 %  07/25/21 1745 (!) 167/104 -- -- 85 13 99 %  07/25/21 1730 (!) 167/105 -- -- 82 12 99 %  07/25/21 1554 (!) 175/112 -- -- 94 16 100 %  07/25/21 1417 (!) 169/101 98.1 F (36.7 C) Oral 92 17 96 %    8:10  PM Reevaluation with update and discussion. After initial assessment and treatment, an updated evaluation reveals no change in clinical status.  I discussed the finding with the patient's son, Jim Philemon on the phone, and her sister, Neoma Laming, who is at the bedside.  [name] told me about a knot on her stomach.  Left lower abdomen has a area of what is likely an abdominal wall hernia, that is easily reducible.  Overlying this or some areas of the shingles skin changes.  Findings discussed and questions since. Daleen Bo   Medical Decision Making:  This patient is presenting for evaluation of ongoing left lumbar/hip pain, and chest pain, which does require a range of treatment options, and is a complaint that involves a moderate risk of  morbidity and mortality. The differential diagnoses include complications from shingles, fracture/arthritis, acute illness. I decided to review old records, and in summary elderly female, coming from home with nonspecific symptoms.  I received additional historical information from the patient's sister in the room and the patient's son, by telephone.  Clinical Laboratory Tests Ordered, included CBC, Metabolic panel, and troponin . Review indicates normal except hemoglobin low, troponin high, CO2 low, glucose high, BUN high, creatinine high, GFR low. Radiologic Tests Ordered, included chest x-ray, pelvis x-ray, left hip x-ray.  I independently Visualized: Radiographic images, which show no acute abnormalities    Critical Interventions-clinical evaluation, laboratory testing, radiography, discussion with patient and family members, observation and disposition  After These Interventions, the Patient was reevaluated and was found stable for discharge.  Patient with nonspecific complaints, and reassuring evaluation.  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  No indication for further aggressive treatment or hospitalization at this time  CRITICAL CARE-no Performed by: Daleen Bo  Nursing Notes Reviewed/ Care Coordinated Applicable Imaging Reviewed Interpretation of Laboratory Data incorporated into ED treatment  The patient appears reasonably screened and/or stabilized for discharge and I doubt any other medical condition or other [facility] requiring further screening, evaluation, or treatment in the ED at this time prior to discharge.  Plan: Home Medications-Tylenol for pain, continue usual; Home Treatments-rest, fluids; return here if the recommended treatment, does not improve the symptoms; Recommended follow up-PCP, as needed     Final Clinical Impression(s) / ED Diagnoses Final diagnoses:  Malaise  Nonspecific low back pain    Rx / DC  Orders ED Discharge Orders     None        Daleen Bo, MD 07/26/21 1013

## 2021-07-25 NOTE — Discharge Instructions (Addendum)
There are no signs of serious injuries or problems at this time.  Take Tylenol every 4 hours if needed for pain.  Follow-up with your doctor, as scheduled.

## 2021-08-03 ENCOUNTER — Ambulatory Visit: Payer: Medicare HMO | Admitting: Neurology

## 2021-08-07 ENCOUNTER — Emergency Department (HOSPITAL_COMMUNITY): Payer: Medicare HMO

## 2021-08-07 ENCOUNTER — Other Ambulatory Visit: Payer: Self-pay

## 2021-08-07 ENCOUNTER — Emergency Department (HOSPITAL_COMMUNITY)
Admission: EM | Admit: 2021-08-07 | Discharge: 2021-08-07 | Disposition: A | Discharge status: Home / Self Care | Payer: Medicare HMO | Attending: Emergency Medicine | Admitting: Emergency Medicine

## 2021-08-07 ENCOUNTER — Encounter (HOSPITAL_COMMUNITY): Payer: Self-pay | Admitting: Emergency Medicine

## 2021-08-07 DIAGNOSIS — Z96641 Presence of right artificial hip joint: Secondary | ICD-10-CM | POA: Diagnosis not present

## 2021-08-07 DIAGNOSIS — Z7902 Long term (current) use of antithrombotics/antiplatelets: Secondary | ICD-10-CM | POA: Insufficient documentation

## 2021-08-07 DIAGNOSIS — H538 Other visual disturbances: Secondary | ICD-10-CM | POA: Insufficient documentation

## 2021-08-07 DIAGNOSIS — Z7982 Long term (current) use of aspirin: Secondary | ICD-10-CM | POA: Diagnosis not present

## 2021-08-07 DIAGNOSIS — Z79899 Other long term (current) drug therapy: Secondary | ICD-10-CM | POA: Insufficient documentation

## 2021-08-07 DIAGNOSIS — J449 Chronic obstructive pulmonary disease, unspecified: Secondary | ICD-10-CM | POA: Insufficient documentation

## 2021-08-07 DIAGNOSIS — Z8616 Personal history of COVID-19: Secondary | ICD-10-CM | POA: Insufficient documentation

## 2021-08-07 DIAGNOSIS — I10 Essential (primary) hypertension: Secondary | ICD-10-CM

## 2021-08-07 DIAGNOSIS — F0390 Unspecified dementia without behavioral disturbance: Secondary | ICD-10-CM | POA: Diagnosis not present

## 2021-08-07 DIAGNOSIS — N184 Chronic kidney disease, stage 4 (severe): Secondary | ICD-10-CM | POA: Insufficient documentation

## 2021-08-07 DIAGNOSIS — R030 Elevated blood-pressure reading, without diagnosis of hypertension: Secondary | ICD-10-CM | POA: Diagnosis present

## 2021-08-07 DIAGNOSIS — I129 Hypertensive chronic kidney disease with stage 1 through stage 4 chronic kidney disease, or unspecified chronic kidney disease: Secondary | ICD-10-CM | POA: Insufficient documentation

## 2021-08-07 DIAGNOSIS — Z87891 Personal history of nicotine dependence: Secondary | ICD-10-CM | POA: Insufficient documentation

## 2021-08-07 DIAGNOSIS — Z7951 Long term (current) use of inhaled steroids: Secondary | ICD-10-CM | POA: Insufficient documentation

## 2021-08-07 LAB — COMPREHENSIVE METABOLIC PANEL
ALT: 19 U/L (ref 0–44)
AST: 23 U/L (ref 15–41)
Albumin: 3.3 g/dL — ABNORMAL LOW (ref 3.5–5.0)
Alkaline Phosphatase: 123 U/L (ref 38–126)
Anion gap: 9 (ref 5–15)
BUN: 39 mg/dL — ABNORMAL HIGH (ref 8–23)
CO2: 16 mmol/L — ABNORMAL LOW (ref 22–32)
Calcium: 9.2 mg/dL (ref 8.9–10.3)
Chloride: 116 mmol/L — ABNORMAL HIGH (ref 98–111)
Creatinine, Ser: 2.75 mg/dL — ABNORMAL HIGH (ref 0.44–1.00)
GFR, Estimated: 17 mL/min — ABNORMAL LOW (ref >60)
Glucose, Bld: 86 mg/dL (ref 70–99)
Potassium: 5.6 mmol/L — ABNORMAL HIGH (ref 3.5–5.1)
Sodium: 141 mmol/L (ref 135–145)
Total Bilirubin: 1 mg/dL (ref 0.3–1.2)
Total Protein: 7.3 g/dL (ref 6.5–8.1)

## 2021-08-07 LAB — CBC WITH DIFFERENTIAL/PLATELET
Abs Immature Granulocytes: 0.07 10*3/uL (ref 0.00–0.07)
Basophils Absolute: 0.1 10*3/uL (ref 0.0–0.1)
Basophils Relative: 1 %
Eosinophils Absolute: 0.5 10*3/uL (ref 0.0–0.5)
Eosinophils Relative: 3 %
HCT: 27.8 % — ABNORMAL LOW (ref 36.0–46.0)
Hemoglobin: 8.8 g/dL — ABNORMAL LOW (ref 12.0–15.0)
Immature Granulocytes: 1 %
Lymphocytes Relative: 18 %
Lymphs Abs: 2.6 10*3/uL (ref 0.7–4.0)
MCH: 27.1 pg (ref 26.0–34.0)
MCHC: 31.7 g/dL (ref 30.0–36.0)
MCV: 85.5 fL (ref 80.0–100.0)
Monocytes Absolute: 1.2 10*3/uL — ABNORMAL HIGH (ref 0.1–1.0)
Monocytes Relative: 8 %
Neutro Abs: 10.2 10*3/uL — ABNORMAL HIGH (ref 1.7–7.7)
Neutrophils Relative %: 69 %
Platelets: 580 10*3/uL — ABNORMAL HIGH (ref 150–400)
RBC: 3.25 MIL/uL — ABNORMAL LOW (ref 3.87–5.11)
RDW: 19.9 % — ABNORMAL HIGH (ref 11.5–15.5)
WBC: 14.7 10*3/uL — ABNORMAL HIGH (ref 4.0–10.5)
nRBC: 0 % (ref 0.0–0.2)

## 2021-08-07 MED ORDER — MORPHINE SULFATE (PF) 2 MG/ML IV SOLN
2.0000 mg | Freq: Once | INTRAVENOUS | Status: AC
Start: 2021-08-07 — End: 2021-08-07
  Administered 2021-08-07: 2 mg via INTRAVENOUS
  Filled 2021-08-07: qty 1

## 2021-08-07 MED ORDER — HYDROCODONE-ACETAMINOPHEN 5-325 MG PO TABS
1.0000 | ORAL_TABLET | Freq: Once | ORAL | Status: AC
  Administered 2021-08-07: 1 via ORAL
  Filled 2021-08-07: qty 1

## 2021-08-07 MED ORDER — HYDRALAZINE HCL 20 MG/ML IJ SOLN
5.0000 mg | Freq: Once | INTRAMUSCULAR | Status: AC
  Administered 2021-08-07: 5 mg via INTRAVENOUS
  Filled 2021-08-07: qty 1

## 2021-08-07 MED ORDER — AMLODIPINE BESYLATE 5 MG PO TABS
10.0000 mg | ORAL_TABLET | Freq: Once | ORAL | Status: AC
  Administered 2021-08-07: 10 mg via ORAL
  Filled 2021-08-07: qty 2

## 2021-08-07 NOTE — ED Provider Notes (Signed)
[facility] DEPT Provider Note   CSN: [id number] Arrival date & time: 08/07/21  1132     History Chief Complaint  Patient presents with   Hypertension    QUINTANA CANELO is a 80 y.o. female.  Patient states that her blood pressure has been elevated.  She has had mild blurred vision.  Patient did not take her blood pressure medicine today.  She has been treated for shingles  The history is provided by the patient and medical records. No language interpreter was used.  Hypertension This is a new problem. The current episode started less than 1 hour ago. The problem occurs constantly. The problem has not changed since onset.Pertinent negatives include no chest pain, no abdominal pain and no headaches. Nothing aggravates the symptoms. Nothing relieves the symptoms. She has tried nothing for the symptoms. The treatment provided no relief.      Past Medical History:  Diagnosis Date   Acute respiratory failure with hypoxia ([place]) 11/01/2019   Anxiety    Arthritis    Chest pain    Chronic kidney disease    Dyspnea    Elevated troponin 12/13/2018   Headache(784.0)    Hyperlipidemia    Hypertension    Joint pain    Leg pain    Peripheral neuropathy    Peripheral vascular disease ([place])    Syncope 09/2020    Patient Active Problem List   Diagnosis Date Noted   Rectal bleeding    Rectal pain    Malnutrition of moderate degree 06/17/2021   Weight loss 06/16/2021   Zoster 06/16/2021   Collapsed lung    COPD  GOLD 0/ still smoking  09/26/2020   Cigarette smoker 09/26/2020   Shortness of breath 07/06/2020   Essential hypertension 07/06/2020   Abdominal pain 07/06/2020   Elevated INR 07/06/2020   Acute respiratory failure due to COVID-19 ([place]) 06/02/2020   Acute renal failure superimposed on chronic kidney disease ([place]) 02/06/2020   Leukocytosis 02/06/2020   Thrombocytosis 02/06/2020   Palliative care encounter    Constipation 01/05/2020    Protein-calorie malnutrition, severe ([place]) 01/04/2020   Acute on chronic renal failure ([place]) 01/04/2020   Hyperkalemia 01/03/2020   AKI (acute kidney injury) ([place]) 01/03/2020   Acute respiratory failure with hypoxia ([place]) 11/01/2019   UTI (urinary tract infection) 11/01/2019   CAP (community acquired pneumonia) 11/01/2019   Acute cystitis without hematuria    Encephalopathy 09/18/2019   SAH (subarachnoid hemorrhage) ([place]) 09/18/2019   Dementia ([place]) 48/18/5631   Metabolic acidosis 49/70/2637   ARF (acute renal failure) ([place]) 09/17/2019   Acute encephalopathy 09/17/2019   Abnormal CT scan, colon    Diarrhea    Diverticulitis 06/07/2019   Depression with anxiety 06/07/2019   DNR (do not resuscitate) 03/23/2019   Volume overload 03/23/2019   Edema 03/23/2019   Acute renal failure (ARF) ([place]) 03/08/2019   Hypokalemia 03/08/2019   Palliative care by specialist    Goals of care, counseling/discussion    Altered mental status    Encephalopathy, toxic 12/24/2018   Aspiration pneumonia of both lower lobes due to gastric secretions ([place]) 12/24/2018   Non-ST elevation (NSTEMI) myocardial infarction [facility])    Chest pain    Dyspnea on exertion    Elevated troponin level 12/09/2018   Chronic migraine 03/27/2018   Lumbar radiculopathy 03/27/2018   Gait abnormality 03/27/2018   Hypertensive urgency 12/28/2016   CKD (chronic kidney disease) stage 4, GFR 15-29 ml/min ([place]) 12/28/2016   Anemia 12/28/2016  Chronic pain syndrome 12/28/2016   Benzodiazepine withdrawal without complication ([place]) 67/89/3810   Chronic right shoulder pain 12/06/2016   Fall 09/17/2016   Fracture of humeral head, closed, right, initial encounter 09/17/2016   Rib fractures 09/17/2016   Multiple falls 09/17/2016   Severe muscle deconditioning 09/17/2016   Failure to thrive in adult 09/17/2016   Melena 04/25/2016   Transient hypotension 04/25/2016   Anxiety state 04/25/2016   Tobacco abuse 04/25/2016    Hyperlipidemia 04/25/2016   Syncope 04/24/2016   Near syncope 04/24/2016   Gait difficulty 01/12/2016   Foot drop, left 01/12/2016   Severe recurrent major depression without psychotic features ([place]) 08/28/2015   Spondylolisthesis at L4-L5 level 07/30/2015   PVD (peripheral vascular disease) ([place]) 07/29/2014   Weakness-Bilateral arm/leg 07/29/2014   Numbness-left leg 07/29/2014   Swelling of limb-Legs 07/29/2014   Atherosclerosis of native arteries of the extremities with intermittent claudication 09/30/2011    Past Surgical History:  Procedure Laterality Date   ABDOMINAL ANGIOGRAM N/A 10/29/2011   Procedure: ABDOMINAL Cyril Loosen;  Surgeon: Elam Dutch, MD;  Location[facility] CATH LAB;  Service: Cardiovascular;  Laterality: N/A;   BACK SURGERY     COLONOSCOPY     ESOPHAGOGASTRODUODENOSCOPY     FEMORAL-FEMORAL BYPASS GRAFT  08/31/2010   FLEXIBLE SIGMOIDOSCOPY N/A 06/12/2019   Procedure: FLEXIBLE SIGMOIDOSCOPY;  Surgeon: Ladene Artist, MD;  Location: [facility];  Service: Endoscopy;  Laterality: N/A;  Patient had little bit to eat this morning so this will be unsedated.   JOINT REPLACEMENT Right 10/18/2010   Hip   LOWER EXTREMITY ANGIOGRAPHY  06/14/2017   Procedure: Lower Extremity Angiography;  Surgeon: Adrian Prows, MD;  Location: [facility] CV LAB;  Service: Cardiovascular;;  Bilateral limited angio performed   RENAL ANGIOGRAPHY N/A 06/14/2017   Procedure: Renal Angiography;  Surgeon: Adrian Prows, MD;  Location: [facility] CV LAB;  Service: Cardiovascular;  Laterality: N/A;   TOTAL HIP ARTHROPLASTY     right     OB History   No obstetric history on file.     Family History  Problem Relation Age of Onset   Heart attack Mother    Heart disease Mother    Hyperlipidemia Mother    Hypertension Mother    Heart attack Father    Heart disease Father        Before age 81   Hyperlipidemia Father    Hypertension Father    Cancer Sister        brain tumor   Aneurysm  Brother        brain   Colon cancer Neg Hx    Liver cancer Neg Hx     Social History   Tobacco Use   Smoking status: Former    Packs/day: 1.00    Years: 50.00    Pack years: 50.00    Types: Cigarettes    Quit date: 04/22/2021    Years since quitting: 0.2   Smokeless tobacco: Never   Tobacco comments:    Quit 3 months ago 07/23/21 [facility]  Vaping Use   Vaping Use: Never used  Substance Use Topics   Alcohol use: No   Drug use: No    Home Medications Prior to Admission medications   Medication Sig Start Date End Date Taking? Authorizing Provider  acetaminophen (TYLENOL) 325 MG tablet Take 2 tablets (650 mg total) by mouth every 6 (six) hours as needed for mild pain, moderate pain or fever (or Fever >/= 101). Patient taking differently: Take  650 mg by mouth every 6 (six) hours as needed for mild pain. 06/16/19   Hongalgi, Lenis Dickinson, MD  albuterol (VENTOLIN HFA) 108 (90 Base) MCG/ACT inhaler Inhale 2 puffs into the lungs every 4 (four) hours as needed for wheezing or shortness of breath.     [provider]  amLODipine (NORVASC) 10 MG tablet Take 1 tablet (10 mg total) by mouth at bedtime. Patient taking differently: Take 10 mg by mouth every morning. 07/10/21   Antonieta Pert, MD  aspirin 81 MG EC tablet Take 1 tablet (81 mg total) by mouth daily. 01/09/19   Caren Griffins, MD  Budeson-Glycopyrrol-Formoterol (BREZTRI AEROSPHERE) 160-9-4.8 MCG/ACT AERO Inhale 2 puffs into the lungs 2 (two) times daily. 07/23/21   Tanda Rockers, MD  Budeson-Glycopyrrol-Formoterol (BREZTRI AEROSPHERE) 160-9-4.8 MCG/ACT AERO Inhale 2 puffs into the lungs 2 (two) times daily. Patient not taking: No sig reported 07/23/21   Tanda Rockers, MD  carvedilol (COREG) 12.5 MG tablet Take 12.5 mg by mouth 2 (two) times daily. 09/05/20   [provider]  cloNIDine (CATAPRES) 0.2 MG tablet Take 0.2 mg by mouth at bedtime. 05/25/20   [provider]  clopidogrel (PLAVIX) 75 MG tablet Take 1 tablet (75  mg total) by mouth daily. 01/09/19   Caren Griffins, MD  diclofenac Sodium (VOLTAREN) 1 % GEL Apply 2 g topically every 6 (six) hours as needed (knee pain). 01/15/20   [provider]  dicyclomine (BENTYL) 20 MG tablet Take 20 mg by mouth 3 (three) times daily. 04/01/20   [provider]  docusate sodium (COLACE) 100 MG capsule Take 1 capsule (100 mg total) by mouth daily. 07/11/21   Antonieta Pert, MD  donepezil (ARICEPT) 5 MG tablet Take 1 tablet (5 mg total) by mouth daily. Patient taking differently: Take 5 mg by mouth at bedtime. 11/20/20   Cameron Sprang, MD  DULoxetine 40 MG CPEP Take 40 mg by mouth daily. 07/11/21   Antonieta Pert, MD  Ensure Plus (ENSURE PLUS) LIQD Take by mouth 2 (two) times daily.    [provider]  Ferrous Sulfate (IRON HIGH-POTENCY) 325 MG TABS Take 325 mg by mouth daily with breakfast. 06/30/19   Hongalgi, Lenis Dickinson, MD  gabapentin (NEURONTIN) 100 MG capsule Take 200 mg by mouth 2 (two) times daily. 07/23/21   [provider]  hydrocortisone (ANUSOL-HC) 25 MG suppository Place 1 suppository (25 mg total) rectally 2 (two) times daily. Patient not taking: No sig reported 07/10/21   Antonieta Pert, MD  hydrOXYzine (ATARAX/VISTARIL) 25 MG tablet Take 25 mg by mouth at bedtime. 05/12/20   [provider]  magnesium oxide (MAG-OX) 400 MG tablet Take 400 mg by mouth 2 (two) times daily.    [provider]  megestrol (MEGACE) 40 MG tablet Take 40 mg by mouth 2 (two) times daily. 05/12/20   [provider]  Multiple Vitamin (MULTIVITAMIN WITH MINERALS) TABS tablet Take 1 tablet by mouth daily. 06/17/19   Hongalgi, Lenis Dickinson, MD  nitroGLYCERIN (NITROSTAT) 0.4 MG SL tablet Place 1 tablet (0.4 mg total) under the tongue every 5 (five) minutes as needed for chest pain. 01/07/20   Allie Bossier, MD  ondansetron (ZOFRAN ODT) 4 MG disintegrating tablet Take 1 tablet (4 mg total) by mouth every 4 (four) hours as needed for nausea or vomiting.  06/09/20   Charlesetta Shanks, MD  oxyCODONE (OXY IR/ROXICODONE) 5 MG immediate release tablet Take 1 tablet (5 mg total) by mouth every  8 (eight) hours as needed for up to 10 doses for moderate pain. Patient not taking: Reported on 07/25/2021 07/10/21   Antonieta Pert, MD  pantoprazole (PROTONIX) 40 MG tablet Take 1 tablet (40 mg total) by mouth daily. 06/16/19   Hongalgi, Lenis Dickinson, MD  pregabalin (LYRICA) 75 MG capsule Take 1 capsule (75 mg total) by mouth daily. 07/11/21 08/10/21  Antonieta Pert, MD  sodium bicarbonate 650 MG tablet Take 2 tablets (1,300 mg total) by mouth 2 (two) times daily. 01/07/20   Allie Bossier, MD  sodium zirconium cyclosilicate (LOKELMA) 5 g packet Take 5 g by mouth daily. Patient not taking: Reported on 07/25/2021 07/10/21   Antonieta Pert, MD  Vitamin D, Ergocalciferol, (DRISDOL) 1.25 MG (50000 UNIT) CAPS capsule Take 50,000 Units by mouth every Monday.    [provider]    Allergies    Patient has no known allergies.  Review of Systems   Review of Systems  Constitutional:  Negative for appetite change and fatigue.  HENT:  Negative for congestion, ear discharge and sinus pressure.        Blurred vision   Eyes:  Negative for discharge.  Respiratory:  Negative for cough.   Cardiovascular:  Negative for chest pain.  Gastrointestinal:  Negative for abdominal pain and diarrhea.  Genitourinary:  Negative for frequency and hematuria.  Musculoskeletal:  Negative for back pain.  Skin:  Negative for rash.  Neurological:  Negative for seizures and headaches.  Psychiatric/Behavioral:  Negative for hallucinations.    Physical Exam Updated Vital Signs BP (!) 180/111   Pulse 88   Temp 99.5 F (37.5 C) (Rectal)   Resp 18   Ht 5\' 3"  (1.6 m)   Wt 43.1 kg   SpO2 98%   BMI 16.83 kg/m   Physical Exam Vitals and nursing note reviewed.  Constitutional:      Appearance: She is well-developed.  HENT:     Head: Normocephalic.     Nose: Nose normal.  Eyes:     General: No  scleral icterus.    Conjunctiva/sclera: Conjunctivae normal.  Neck:     Thyroid: No thyromegaly.  Cardiovascular:     Rate and Rhythm: Normal rate and regular rhythm.     Heart sounds: No murmur heard.   No friction rub. No gallop.  Pulmonary:     Breath sounds: No stridor. No wheezing or rales.  Chest:     Chest wall: No tenderness.  Abdominal:     General: There is no distension.     Tenderness: There is no abdominal tenderness. There is no rebound.  Musculoskeletal:        General: Normal range of motion.     Cervical back: Neck supple.  Lymphadenopathy:     Cervical: No cervical adenopathy.  Skin:    Findings: No erythema or rash.  Neurological:     Mental Status: She is alert and oriented to person, place, and time.     Motor: No abnormal muscle tone.     Coordination: Coordination normal.  Psychiatric:        Behavior: Behavior normal.    ED Results / Procedures / Treatments   Labs (all labs ordered are listed, but only abnormal results are displayed) Labs Reviewed  CBC WITH DIFFERENTIAL/PLATELET - Abnormal; Notable for the following components:      Result Value   WBC 14.7 (*)    RBC 3.25 (*)    Hemoglobin 8.8 (*)    HCT 27.8 (*)  RDW 19.9 (*)    Platelets 580 (*)    Neutro Abs 10.2 (*)    Monocytes Absolute 1.2 (*)    All other components within normal limits  COMPREHENSIVE METABOLIC PANEL - Abnormal; Notable for the following components:   Potassium 5.6 (*)    Chloride 116 (*)    CO2 16 (*)    BUN 39 (*)    Creatinine, Ser 2.75 (*)    Albumin 3.3 (*)    GFR, Estimated 17 (*)    All other components within normal limits    EKG None  Radiology CT Head Wo Contrast  Result Date: 08/07/2021 CLINICAL DATA:  Cerebral hemorrhage suspected. EXAM: CT HEAD WITHOUT CONTRAST TECHNIQUE: Contiguous axial images were obtained from the base of the skull through the vertex without intravenous contrast. COMPARISON:  CT head 07/18/2021 FINDINGS: Brain:  Generalized atrophy. Chronic microvascular ischemic changes in the white matter. Negative for acute infarct, hemorrhage, mass. Vascular: Negative for hyperdense vessel Skull: Negative Sinuses/Orbits: Mucosal edema right maxillary sinus. Remaining sinuses clear. Bilateral cataract extraction Other: None IMPRESSION: Moderate atrophy.  Chronic microvascular ischemic change No acute abnormality no change from the recent study. Electronically Signed   By: Franchot Gallo M.D.   On: 08/07/2021 14:49    Procedures Procedures   Medications Ordered in ED Medications  amLODipine (NORVASC) tablet 10 mg (has no administration in time range)  hydrALAZINE (APRESOLINE) injection 5 mg (5 mg Intravenous Given 08/07/21 1331)  morphine 2 MG/ML injection 2 mg (2 mg Intravenous Given 08/07/21 1328)    ED Course  I have reviewed the triage vital signs and the nursing notes.  Pertinent labs & imaging results that were available during my care of the patient were reviewed by me and considered in my medical decision making (see chart for details). CT negative.  Mild elevated white count.  Normal.   MDM Rules/Calculators/A&P                           Poorly controlled blood pressure.  She is placed back on her medicine and follow-up with PCP Final Clinical Impression(s) / ED Diagnoses Final diagnoses:  Primary hypertension    Rx / DC Orders ED Discharge Orders     None        Milton Ferguson, MD 08/07/21 1516

## 2021-08-07 NOTE — Discharge Instructions (Signed)
Continue taking your blood pressure medicine and follow-up with your primary care doctor next week

## 2021-08-07 NOTE — ED Triage Notes (Signed)
[facility] transported pt from home and reports the following. Pt did not take blood pressure medication yesterday. Pt did take BP medication this morning. Pt reports blurred vision. No stroke symptoms. Generalized weakness. EMS BP readings were 170/100 and 198/110.

## 2021-08-14 IMAGING — CT CT ABD-PELV W/O CM
2 of 5 series · 15 of 46 positions shown, 17 images · non-contrast
Comparison: 06/02/2020

CLINICAL DATA: [id number] positivity, abdominal pain with rebound
tenderness

EXAM:
CT ABDOMEN AND PELVIS WITHOUT CONTRAST
TECHNIQUE: Multidetector CT imaging of the abdomen and pelvis was performed
following the standard protocol without IV contrast.

[Series 6: (person_name) abd/ pelvis 5.0 i30f 2 (person_name) · axial · 0.69mm/px · z∈[+958,+1282]mm · 12 of 77 slices shown, 14 images]
[im 6/77  soft-tissue]
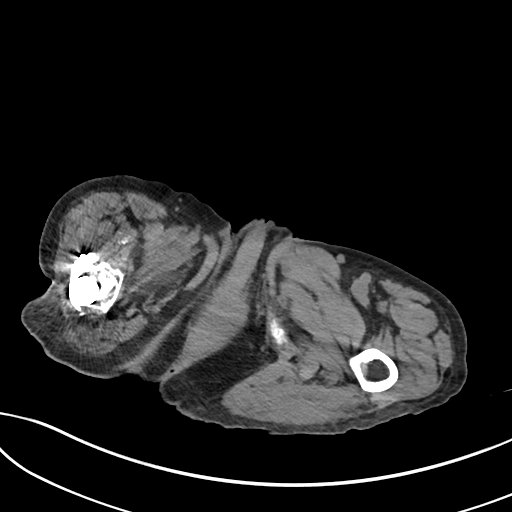
[im 6/77  bone]
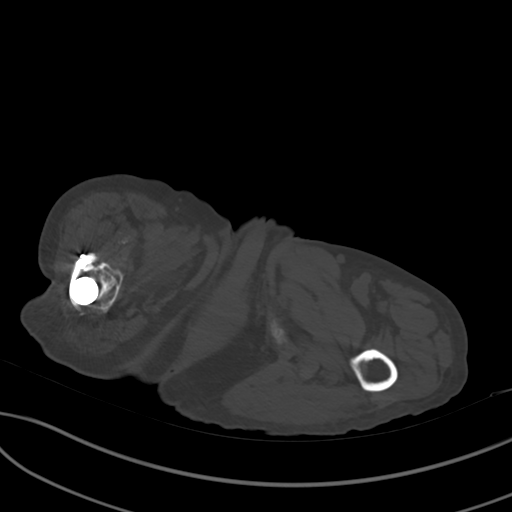
[im 11/77  soft-tissue]
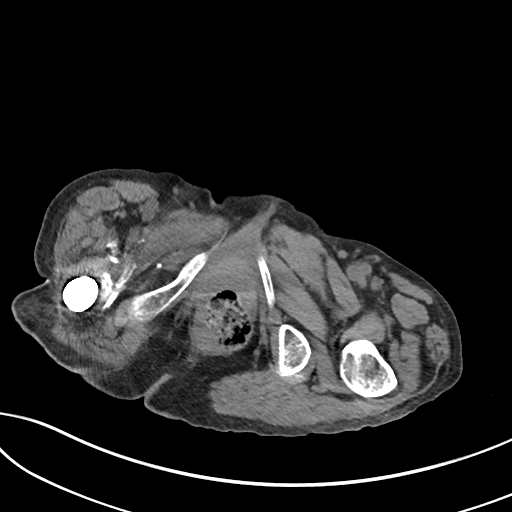
[im 17/77  soft-tissue]
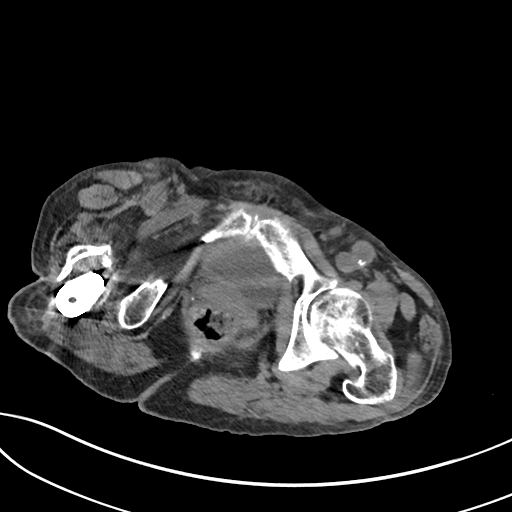
[im 22/77  soft-tissue]
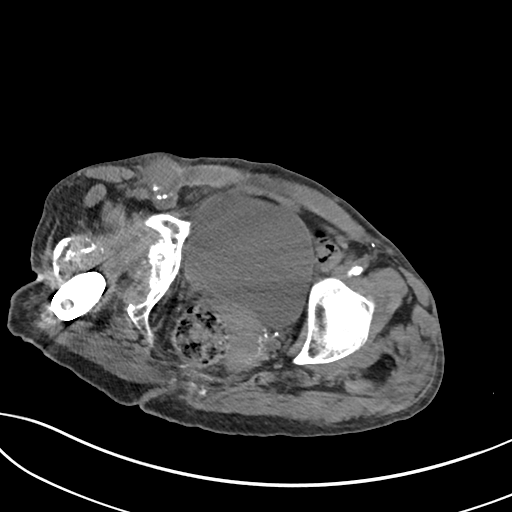
[im 28/77  soft-tissue]
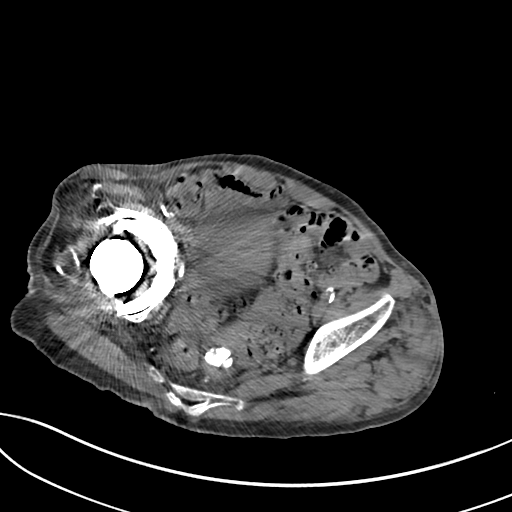
[im 33/77  soft-tissue]
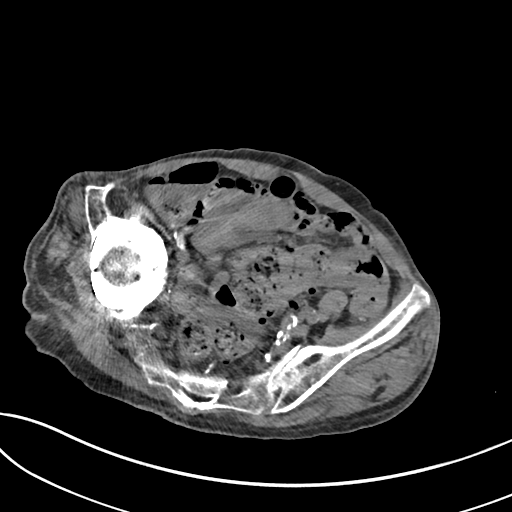
[im 44/77  soft-tissue]
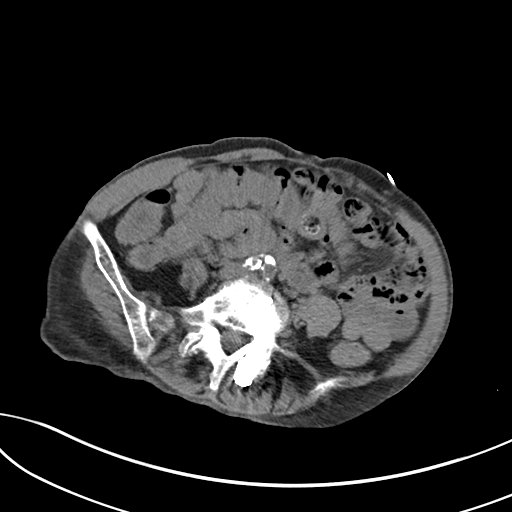
[im 49/77  soft-tissue]
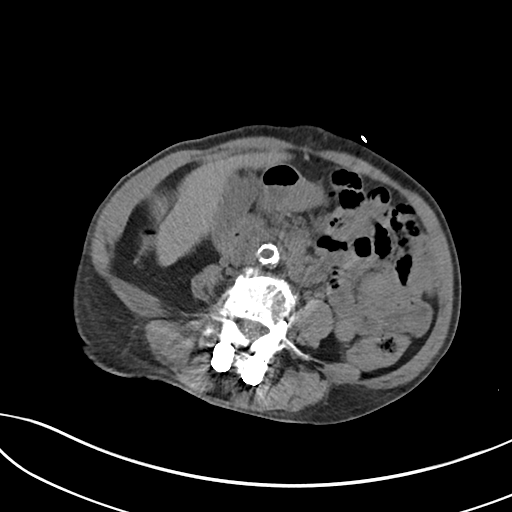
[im 55/77  soft-tissue]
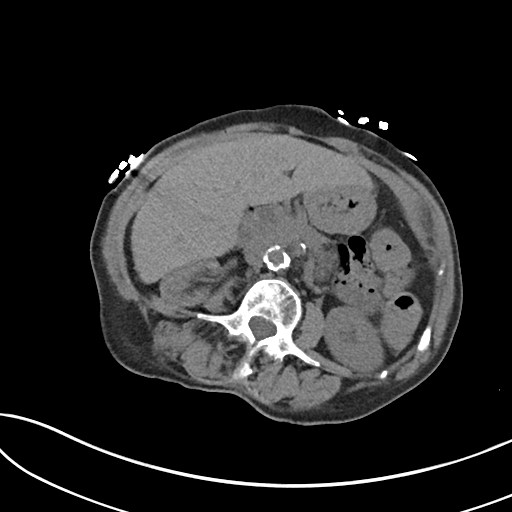
[im 55/77  bone]
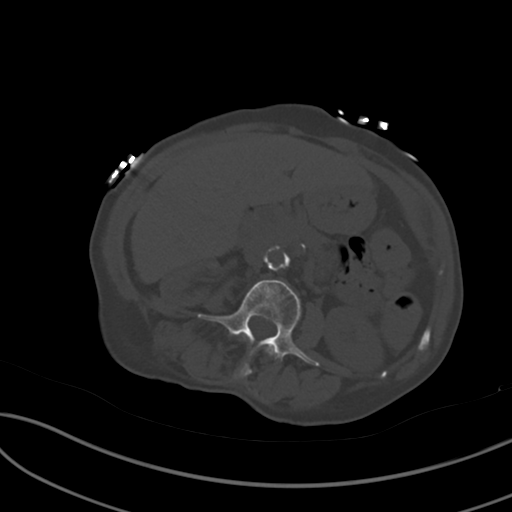
[im 60/77  soft-tissue]
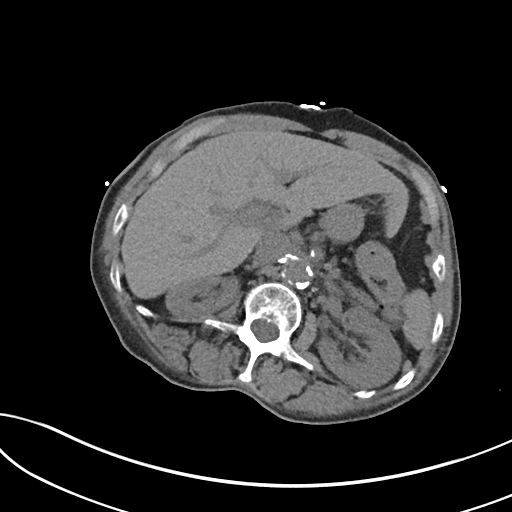
[im 66/77  soft-tissue]
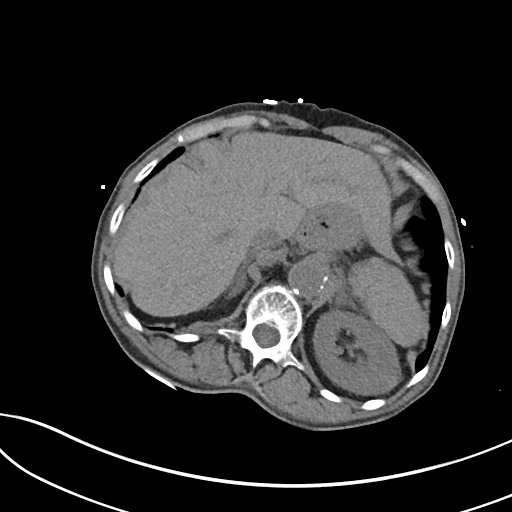
[im 71/77  soft-tissue]
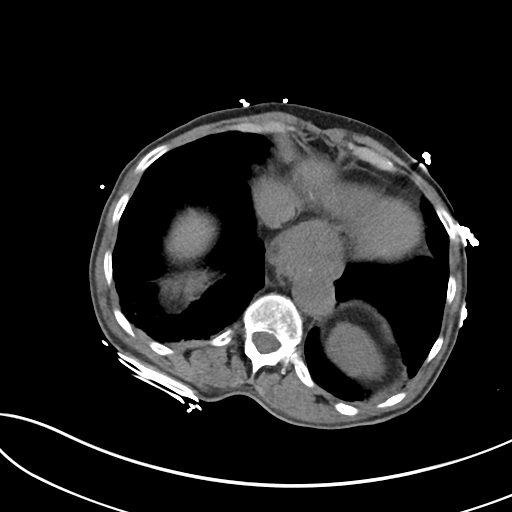

[Series 7: cor st · coronal · 0.59mm/px · 3 of 94 slices shown]
[im 32/94  soft-tissue]
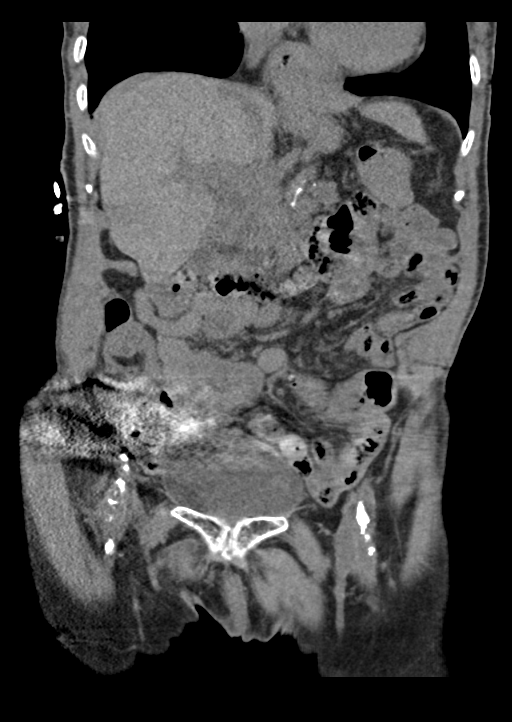
[im 42/94  soft-tissue]
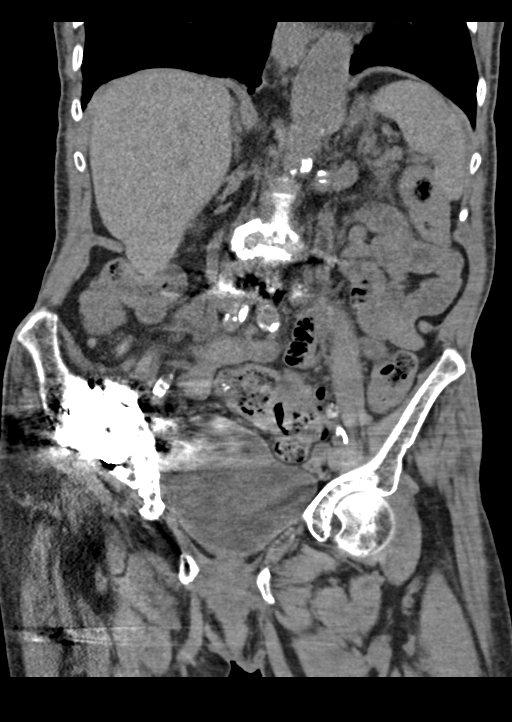
[im 52/94  soft-tissue]
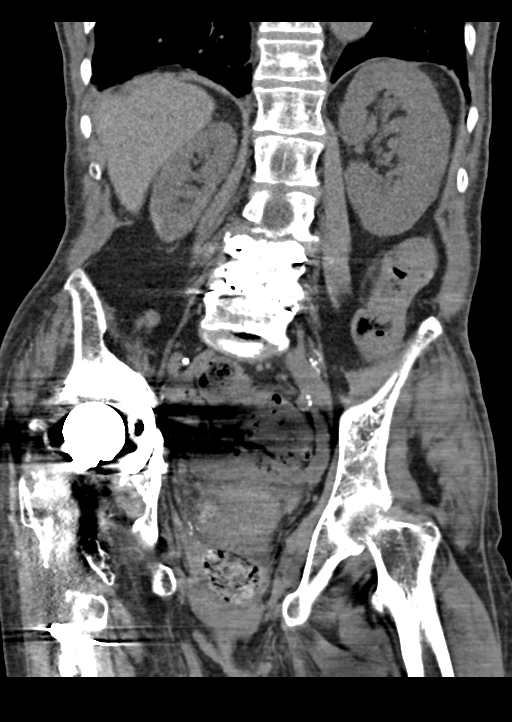

[15 of 46 positions shown; findings below may reference images not displayed]

FINDINGS: Lower chest: Very mild patchy opacities are noted consistent with
the given clinical history. No sizable effusion is seen. Hiatal
hernia is noted and stable.

Hepatobiliary: No focal liver abnormality is seen. No gallstones,
gallbladder wall thickening, or biliary dilatation.

Pancreas: Unremarkable. No pancreatic ductal dilatation or
surrounding inflammatory changes.

Spleen: Normal in size without focal abnormality.

Adrenals/Urinary Tract: Adrenal glands are within normal limits.
Renal vascular calcifications are noted bilaterally. The right
kidney is somewhat shrunken compared to the left but stable in
appearance from the prior study. No obstructive changes are seen.
The bladder is well distended.

Stomach/Bowel: Scattered diverticular change of the colon is noted
without evidence of diverticulitis. The appendix is within normal
limits. Small bowel and stomach are within normal limits aside from
the previously described hiatal hernia.

Vascular/Lymphatic: Diffuse atherosclerotic changes are noted. No
significant lymphadenopathy is seen. There remains lobulated soft
tissue adjacent to the femoral to femoral crossover graft consistent
with a pseudoaneurysm. The overall appearance is stable from the
prior exam.

Reproductive: Uterus and bilateral adnexa are unremarkable.

Other: No abdominal wall hernia or abnormality. No abdominopelvic
ascites.

Musculoskeletal: Postsurgical changes are noted in the right hip as
well as the lumbar spine stable in appearance from the prior exam.
No acute fracture is seen.
IMPRESSION: Chronic changes stable in appearance from the prior CT examination.
No acute abnormality to correspond with the patient's physical exam
is identified.

## 2021-08-14 IMAGING — DX DG CHEST 1V PORT
1 series · 1 of 1 positions shown · non-contrast
Comparison: June 09, 2020

CLINICAL DATA: Sepsis. Patient had positive [id number] test 2 months
ago. Shortness of breath and cough.

EXAM:
PORTABLE CHEST 1 VIEW

[chest]
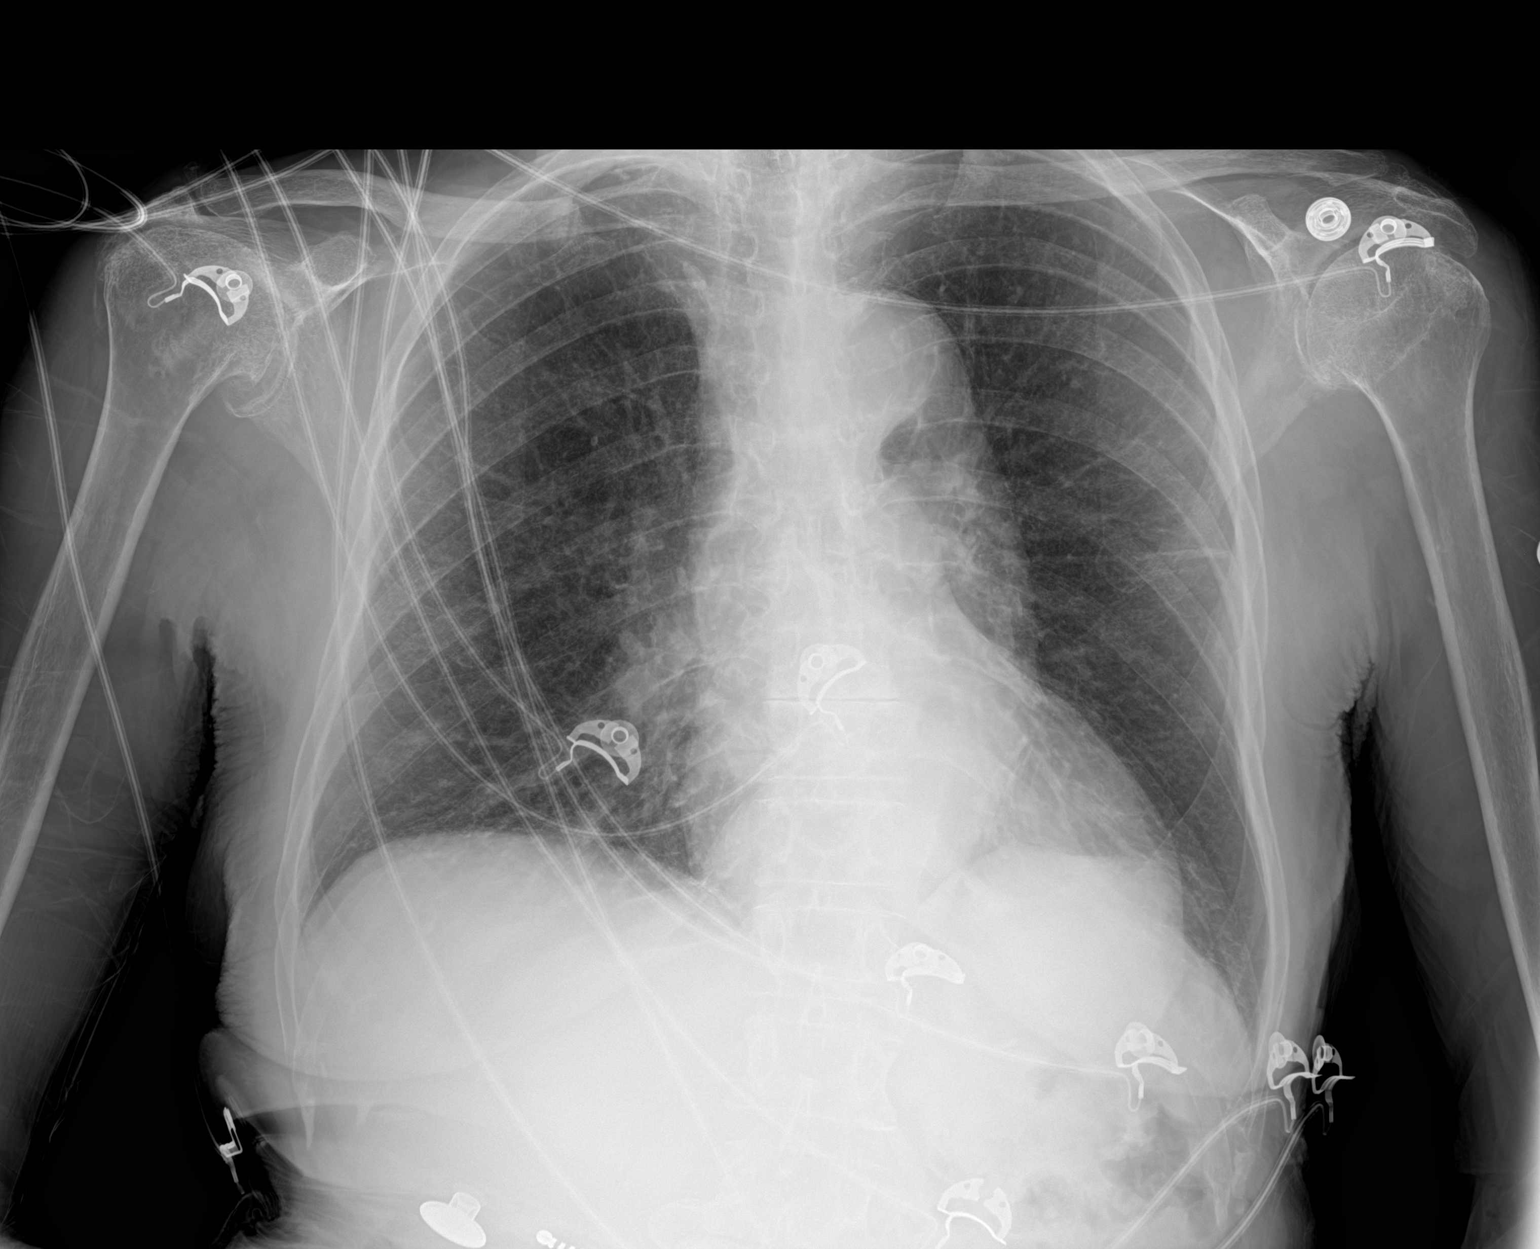

[1 of 1 positions shown; findings below may reference images not displayed]

FINDINGS: The heart, hila, and mediastinum are normal. No pneumothorax. No
nodules or masses. No focal infiltrates.
IMPRESSION: No active disease.

## 2021-08-17 NOTE — Progress Notes (Deleted)
[facility] [place]  ASSESSMENT / PLAN: 80 y.o. female with *** - ***  CHIEF COMPLAINT: ***  HISTORY OF PRESENT ILLNESS: Alyssa [name] is a 80 y.o. female ***  VASCULAR SURGICAL HISTORY: ***  VASCULAR RISK FACTORS: {FINDINGS; POSITIVE NEGATIVE:520-035-7207} history of stroke / transient ischemic attack. {FINDINGS; POSITIVE NEGATIVE:520-035-7207} history of coronary artery disease. *** history of PCI. *** history of CABG.  {FINDINGS; POSITIVE NEGATIVE:520-035-7207} history of diabetes mellitus. Last A1c ***. {FINDINGS; POSITIVE NEGATIVE:520-035-7207} history of smoking. *** actively smoking. {FINDINGS; POSITIVE NEGATIVE:520-035-7207} history of hypertension. *** drug regimen with *** control. {FINDINGS; POSITIVE NEGATIVE:520-035-7207} history of chronic kidney disease.  Last GFR ***. CKD {stage:30421363}. {FINDINGS; POSITIVE NEGATIVE:520-035-7207} history of chronic obstructive pulmonary disease, treated with ***.  FUNCTIONAL STATUS: ECOG performance status: {findings; ecog performance status:31780} Ambulatory status: {TNHAmbulation:25868}  Past Medical History:  Diagnosis Date   Acute respiratory failure with hypoxia (HCC) 11/01/2019   Anxiety    Arthritis    Chest pain    Chronic kidney disease    Dyspnea    Elevated troponin 12/13/2018   Headache(784.0)    Hyperlipidemia    Hypertension    Joint pain    Leg pain    Peripheral neuropathy    Peripheral vascular disease ([place])    Syncope 09/2020    Past Surgical History:  Procedure Laterality Date   ABDOMINAL ANGIOGRAM N/A 10/29/2011   Procedure: ABDOMINAL ANGIOGRAM;  Surgeon: Elam Dutch, MD;  Location: [facility] CATH LAB;  Service: Cardiovascular;  Laterality: N/A;   BACK SURGERY     COLONOSCOPY     ESOPHAGOGASTRODUODENOSCOPY     FEMORAL-FEMORAL BYPASS GRAFT  08/31/2010   FLEXIBLE SIGMOIDOSCOPY N/A 06/12/2019   Procedure: FLEXIBLE SIGMOIDOSCOPY;  Surgeon: Ladene Artist, MD;  Location: [place];  Service: Endoscopy;  Laterality: N/A;  Patient had little bit to eat this morning so this will be unsedated.   JOINT REPLACEMENT Right 10/18/2010   Hip   LOWER EXTREMITY ANGIOGRAPHY  06/14/2017   Procedure: Lower Extremity Angiography;  Surgeon: Adrian Prows, MD;  Location: [place] CV LAB;  Service: Cardiovascular;;  Bilateral limited angio performed   RENAL ANGIOGRAPHY N/A 06/14/2017   Procedure: Renal Angiography;  Surgeon: Adrian Prows, MD;  Location: [place] [facility];  Service: Cardiovascular;  Laterality: N/A;   TOTAL HIP ARTHROPLASTY     right    Family History  Problem Relation Age of Onset   Heart attack Mother    Heart disease Mother    Hyperlipidemia Mother    Hypertension Mother    Heart attack Father    Heart disease Father        Before age 54   Hyperlipidemia Father    Hypertension Father    Cancer Sister        brain tumor   Aneurysm Brother        brain   Colon cancer Neg Hx    Liver cancer Neg Hx     Social History   Socioeconomic History   Marital status: Single    Spouse name: Not on file   Number of children: 2   Years of education: 11th   Highest education level: Not on file  Occupational History   Occupation: Retired  Tobacco Use   Smoking status: Former    Packs/day: 1.00    Years: 50.00    Pack years: 50.00    Types: Cigarettes    Quit date: 04/22/2021    Years since quitting: 0.3  Smokeless tobacco: Never   Tobacco comments:    Quit 3 months ago 07/23/21 [facility]  Vaping Use   Vaping Use: Never used  Substance and Sexual Activity   Alcohol use: No   Drug use: No   Sexual activity: Not Currently  Other Topics Concern   Not on file  Social History Narrative    Lives at home with her brother.   Right-handed.   Occasional caffeine use.   Social Determinants of Health   Financial Resource Strain: Not on file  Food Insecurity: Not on file  Transportation Needs: Not on file  Physical Activity: Not on file  Stress: Not on  file  Social Connections: Not on file  Intimate Partner Violence: Not on file    No Known Allergies  Current Outpatient Medications  Medication Sig Dispense Refill   acetaminophen (TYLENOL) 325 MG tablet Take 2 tablets (650 mg total) by mouth every 6 (six) hours as needed for mild pain, moderate pain or fever (or Fever >/= 101). (Patient taking differently: Take 650 mg by mouth every 6 (six) hours as needed for mild pain.)     albuterol (VENTOLIN HFA) 108 (90 Base) MCG/ACT inhaler Inhale 2 puffs into the lungs every 4 (four) hours as needed for wheezing or shortness of breath.      amLODipine (NORVASC) 10 MG tablet Take 1 tablet (10 mg total) by mouth at bedtime. (Patient taking differently: Take 10 mg by mouth every morning.)     aspirin 81 MG EC tablet Take 1 tablet (81 mg total) by mouth daily. 30 tablet 1   Budeson-Glycopyrrol-Formoterol (BREZTRI AEROSPHERE) 160-9-4.8 MCG/ACT AERO Inhale 2 puffs into the lungs 2 (two) times daily. 10.7 g 11   Budeson-Glycopyrrol-Formoterol (BREZTRI AEROSPHERE) 160-9-4.8 MCG/ACT AERO Inhale 2 puffs into the lungs 2 (two) times daily. (Patient not taking: No sig reported) 10.7 g 0   carvedilol (COREG) 12.5 MG tablet Take 12.5 mg by mouth 2 (two) times daily.     cloNIDine (CATAPRES) 0.2 MG tablet Take 0.2 mg by mouth at bedtime.     clopidogrel (PLAVIX) 75 MG tablet Take 1 tablet (75 mg total) by mouth daily. 30 tablet 1   diclofenac Sodium (VOLTAREN) 1 % GEL Apply 2 g topically every 6 (six) hours as needed (knee pain).     dicyclomine (BENTYL) 20 MG tablet Take 20 mg by mouth 3 (three) times daily.     docusate sodium (COLACE) 100 MG capsule Take 1 capsule (100 mg total) by mouth daily. 10 capsule 0   donepezil (ARICEPT) 5 MG tablet Take 1 tablet (5 mg total) by mouth daily. (Patient taking differently: Take 5 mg by mouth at bedtime.) 90 tablet 3   DULoxetine 40 MG CPEP Take 40 mg by mouth daily.  3   Ensure Plus (ENSURE PLUS) LIQD Take by mouth 2 (two)  times daily.     Ferrous Sulfate (IRON HIGH-POTENCY) 325 MG TABS Take 325 mg by mouth daily with breakfast.     gabapentin (NEURONTIN) 100 MG capsule Take 200 mg by mouth 2 (two) times daily.     hydrocortisone (ANUSOL-HC) 25 MG suppository Place 1 suppository (25 mg total) rectally 2 (two) times daily. (Patient not taking: No sig reported) 12 suppository 0   hydrOXYzine (ATARAX/VISTARIL) 25 MG tablet Take 25 mg by mouth at bedtime.     magnesium oxide (MAG-OX) 400 MG tablet Take 400 mg by mouth 2 (two) times daily.     megestrol (MEGACE) 40 MG tablet Take  40 mg by mouth 2 (two) times daily.     Multiple Vitamin (MULTIVITAMIN WITH MINERALS) TABS tablet Take 1 tablet by mouth daily.     nitroGLYCERIN (NITROSTAT) 0.4 MG SL tablet Place 1 tablet (0.4 mg total) under the tongue every 5 (five) minutes as needed for chest pain. 30 tablet 0   ondansetron (ZOFRAN ODT) 4 MG disintegrating tablet Take 1 tablet (4 mg total) by mouth every 4 (four) hours as needed for nausea or vomiting. 20 tablet 0   oxyCODONE (OXY IR/ROXICODONE) 5 MG immediate release tablet Take 1 tablet (5 mg total) by mouth every 8 (eight) hours as needed for up to 10 doses for moderate pain. (Patient not taking: Reported on 07/25/2021) 10 tablet 0   pantoprazole (PROTONIX) 40 MG tablet Take 1 tablet (40 mg total) by mouth daily. 30 tablet 0   pregabalin (LYRICA) 75 MG capsule Take 1 capsule (75 mg total) by mouth daily. 30 capsule 0   sodium bicarbonate 650 MG tablet Take 2 tablets (1,300 mg total) by mouth 2 (two) times daily. 60 tablet 0   sodium zirconium cyclosilicate (LOKELMA) 5 g packet Take 5 g by mouth daily. (Patient not taking: Reported on 07/25/2021)     Vitamin D, Ergocalciferol, (DRISDOL) 1.25 MG (50000 UNIT) CAPS capsule Take 50,000 Units by mouth every Monday.     No current facility-administered medications for this visit.    REVIEW OF SYSTEMS:  [X]  denotes positive finding, [ ]  denotes negative finding Cardiac   Comments:  Chest pain or chest pressure: ***   Shortness of breath upon exertion:    Short of breath when lying flat:    Irregular heart rhythm:        Vascular    Pain in calf, thigh, or hip brought on by ambulation:    Pain in feet at night that wakes you up from your sleep:     Blood clot in your veins:    Leg swelling:         Pulmonary    Oxygen at home:    Productive cough:     Wheezing:         Neurologic    Sudden weakness in arms or legs:     Sudden numbness in arms or legs:     Sudden onset of difficulty speaking or slurred speech:    Temporary loss of vision in one eye:     Problems with dizziness:         Gastrointestinal    Blood in stool:     Vomited blood:         Genitourinary    Burning when urinating:     Blood in urine:        Psychiatric    Major depression:         Hematologic    Bleeding problems:    Problems with blood clotting too easily:        Skin    Rashes or ulcers:        Constitutional    Fever or chills:      PHYSICAL EXAM There were no vitals filed for this visit.  Constitutional: *** appearing. *** distress. Appears *** nourished.  Neurologic: CN ***. *** focal findings. *** sensory loss. Psychiatric: *** Mood and affect symmetric and appropriate. Eyes: *** No icterus. No conjunctival pallor. Ears, nose, throat: *** mucous membranes moist. Midline trachea.  Cardiac: *** rate and rhythm.  Respiratory: *** unlabored. Abdominal: *** soft, non-tender,  non-distended.  Peripheral vascular: *** Extremity: *** edema. *** cyanosis. *** pallor.  Skin: *** gangrene. *** ulceration.  Lymphatic: *** Stemmer's sign. *** palpable lymphadenopathy.  PERTINENT LABORATORY AND RADIOLOGIC DATA  Most recent CBC CBC Latest Ref Rng & Units 08/07/2021 07/25/2021 07/18/2021  WBC 4.0 - 10.5 K/uL 14.7(H) 9.9 -  Hemoglobin 12.0 - 15.0 g/dL 8.8(L) 8.4(L) 7.8(L)  Hematocrit 36.0 - 46.0 % 27.8(L) 26.3(L) 23.0(L)  Platelets 150 - 400 K/uL 580(H)  693(H) -     Most recent CMP CMP Latest Ref Rng & Units 08/07/2021 07/25/2021 07/18/2021  Glucose 70 - 99 mg/dL 86 113(H) -  BUN 8 - 23 mg/dL 39(H) 41(H) -  Creatinine 0.44 - 1.00 mg/dL 2.75(H) 2.83(H) -  Sodium 135 - 145 mmol/L 141 141 139  Potassium 3.5 - 5.1 mmol/L 5.6(H) 4.8 5.6(H)  Chloride 98 - 111 mmol/L 116(H) 110 -  CO2 22 - 32 mmol/L 16(L) 20(L) -  Calcium 8.9 - 10.3 mg/dL 9.2 9.3 -  Total Protein 6.5 - 8.1 g/dL 7.3 - -  Total Bilirubin 0.3 - 1.2 mg/dL 1.0 - -  Alkaline Phos 38 - 126 U/L 123 - -  AST 15 - 41 U/L 23 - -  ALT 0 - 44 U/L 19 - -    Renal function Estimated Creatinine Clearance: 11.5 mL/min (A) (by C-G formula based on SCr of 2.75 mg/dL (H)).  Hgb A1c MFr Bld (%)  Date Value  06/21/2021 6.7 (H)    No results found for: LDLCALC, LDLC, HIRISKLDL, POCLDL, LDLDIRECT, REALLDLC, TOTLDLC   Vascular Imaging: ***  Magnus Crescenzo N. Stanford Breed, MD [facility] Phone Number: [phone] 08/17/2021 11:05 AM  Total time spent on preparing this encounter including chart review, data review, collecting history, examining the patient, coordinating care for this {tnhtimebilling:26202}  Portions of this report may have been transcribed using voice recognition software.  Every effort has been made to ensure accuracy; however, inadvertent computerized transcription errors may still be present.

## 2021-08-18 ENCOUNTER — Encounter (HOSPITAL_COMMUNITY): Payer: Medicare HMO

## 2021-08-18 ENCOUNTER — Encounter: Payer: Medicare HMO | Admitting: Vascular Surgery

## 2021-08-20 ENCOUNTER — Other Ambulatory Visit: Payer: Self-pay | Admitting: Family Medicine

## 2021-08-20 DIAGNOSIS — R1012 Left upper quadrant pain: Secondary | ICD-10-CM

## 2021-09-10 ENCOUNTER — Observation Stay (HOSPITAL_COMMUNITY): Payer: Medicare HMO

## 2021-09-10 ENCOUNTER — Emergency Department (HOSPITAL_COMMUNITY): Payer: Medicare HMO

## 2021-09-10 ENCOUNTER — Inpatient Hospital Stay (HOSPITAL_COMMUNITY)
Admission: EM | Admit: 2021-09-10 | Discharge: 2021-09-17 | DRG: 304 | Disposition: A | Discharge status: Home Health | Payer: Medicare HMO | Attending: Internal Medicine | Admitting: Internal Medicine

## 2021-09-10 DIAGNOSIS — Z681 Body mass index (BMI) 19 or less, adult: Secondary | ICD-10-CM

## 2021-09-10 DIAGNOSIS — Z8249 Family history of ischemic heart disease and other diseases of the circulatory system: Secondary | ICD-10-CM

## 2021-09-10 DIAGNOSIS — I724 Aneurysm of artery of lower extremity: Secondary | ICD-10-CM | POA: Diagnosis present

## 2021-09-10 DIAGNOSIS — N179 Acute kidney failure, unspecified: Secondary | ICD-10-CM | POA: Diagnosis present

## 2021-09-10 DIAGNOSIS — I248 Other forms of acute ischemic heart disease: Secondary | ICD-10-CM | POA: Diagnosis present

## 2021-09-10 DIAGNOSIS — D631 Anemia in chronic kidney disease: Secondary | ICD-10-CM | POA: Diagnosis present

## 2021-09-10 DIAGNOSIS — I161 Hypertensive emergency: Secondary | ICD-10-CM | POA: Diagnosis not present

## 2021-09-10 DIAGNOSIS — I252 Old myocardial infarction: Secondary | ICD-10-CM

## 2021-09-10 DIAGNOSIS — Z20822 Contact with and (suspected) exposure to covid-19: Secondary | ICD-10-CM | POA: Diagnosis present

## 2021-09-10 DIAGNOSIS — N184 Chronic kidney disease, stage 4 (severe): Secondary | ICD-10-CM | POA: Diagnosis present

## 2021-09-10 DIAGNOSIS — J441 Chronic obstructive pulmonary disease with (acute) exacerbation: Secondary | ICD-10-CM | POA: Diagnosis present

## 2021-09-10 DIAGNOSIS — X58XXXA Exposure to other specified factors, initial encounter: Secondary | ICD-10-CM | POA: Diagnosis present

## 2021-09-10 DIAGNOSIS — R0602 Shortness of breath: Secondary | ICD-10-CM | POA: Diagnosis not present

## 2021-09-10 DIAGNOSIS — E876 Hypokalemia: Secondary | ICD-10-CM | POA: Diagnosis present

## 2021-09-10 DIAGNOSIS — I169 Hypertensive crisis, unspecified: Secondary | ICD-10-CM | POA: Diagnosis not present

## 2021-09-10 DIAGNOSIS — S301XXA Contusion of abdominal wall, initial encounter: Secondary | ICD-10-CM | POA: Diagnosis present

## 2021-09-10 DIAGNOSIS — E785 Hyperlipidemia, unspecified: Secondary | ICD-10-CM | POA: Diagnosis present

## 2021-09-10 DIAGNOSIS — R0902 Hypoxemia: Secondary | ICD-10-CM | POA: Diagnosis present

## 2021-09-10 DIAGNOSIS — I129 Hypertensive chronic kidney disease with stage 1 through stage 4 chronic kidney disease, or unspecified chronic kidney disease: Secondary | ICD-10-CM | POA: Diagnosis present

## 2021-09-10 DIAGNOSIS — R1012 Left upper quadrant pain: Secondary | ICD-10-CM | POA: Diagnosis present

## 2021-09-10 DIAGNOSIS — I959 Hypotension, unspecified: Secondary | ICD-10-CM | POA: Diagnosis not present

## 2021-09-10 DIAGNOSIS — E43 Unspecified severe protein-calorie malnutrition: Secondary | ICD-10-CM | POA: Diagnosis present

## 2021-09-10 DIAGNOSIS — Z8673 Personal history of transient ischemic attack (TIA), and cerebral infarction without residual deficits: Secondary | ICD-10-CM

## 2021-09-10 DIAGNOSIS — F17210 Nicotine dependence, cigarettes, uncomplicated: Secondary | ICD-10-CM | POA: Diagnosis present

## 2021-09-10 DIAGNOSIS — J449 Chronic obstructive pulmonary disease, unspecified: Secondary | ICD-10-CM | POA: Diagnosis present

## 2021-09-10 DIAGNOSIS — Z7982 Long term (current) use of aspirin: Secondary | ICD-10-CM

## 2021-09-10 DIAGNOSIS — F0390 Unspecified dementia without behavioral disturbance: Secondary | ICD-10-CM | POA: Diagnosis present

## 2021-09-10 DIAGNOSIS — Z79899 Other long term (current) drug therapy: Secondary | ICD-10-CM

## 2021-09-10 DIAGNOSIS — I739 Peripheral vascular disease, unspecified: Secondary | ICD-10-CM | POA: Diagnosis present

## 2021-09-10 DIAGNOSIS — Z66 Do not resuscitate: Secondary | ICD-10-CM | POA: Diagnosis present

## 2021-09-10 DIAGNOSIS — I2510 Atherosclerotic heart disease of native coronary artery without angina pectoris: Secondary | ICD-10-CM | POA: Diagnosis present

## 2021-09-10 DIAGNOSIS — Z83438 Family history of other disorder of lipoprotein metabolism and other lipidemia: Secondary | ICD-10-CM

## 2021-09-10 DIAGNOSIS — Z7902 Long term (current) use of antithrombotics/antiplatelets: Secondary | ICD-10-CM

## 2021-09-10 DIAGNOSIS — Z96641 Presence of right artificial hip joint: Secondary | ICD-10-CM | POA: Diagnosis present

## 2021-09-10 DIAGNOSIS — G629 Polyneuropathy, unspecified: Secondary | ICD-10-CM | POA: Diagnosis present

## 2021-09-10 DIAGNOSIS — E440 Moderate protein-calorie malnutrition: Secondary | ICD-10-CM | POA: Diagnosis present

## 2021-09-10 DIAGNOSIS — I10 Essential (primary) hypertension: Secondary | ICD-10-CM | POA: Diagnosis present

## 2021-09-10 LAB — I-STAT ARTERIAL BLOOD GAS, ED
Acid-base deficit: 5 mmol/L — ABNORMAL HIGH (ref 0.0–2.0)
Bicarbonate: 18.7 mmol/L — ABNORMAL LOW (ref 20.0–28.0)
Calcium, Ion: 1.14 mmol/L — ABNORMAL LOW (ref 1.15–1.40)
HCT: 25 % — ABNORMAL LOW (ref 36.0–46.0)
Hemoglobin: 8.5 g/dL — ABNORMAL LOW (ref 12.0–15.0)
O2 Saturation: 99 %
Patient temperature: 97.4
Potassium: <2 mmol/L — CL (ref 3.5–5.1)
Sodium: 139 mmol/L (ref 135–145)
TCO2: 19 mmol/L — ABNORMAL LOW (ref 22–32)
pCO2 arterial: 26.3 mmHg — ABNORMAL LOW (ref 32.0–48.0)
pH, Arterial: 7.456 — ABNORMAL HIGH (ref 7.350–7.450)
pO2, Arterial: 131 mmHg — ABNORMAL HIGH (ref 83.0–108.0)

## 2021-09-10 LAB — TROPONIN I (HIGH SENSITIVITY)
Troponin I (High Sensitivity): 188 ng/L (ref <18)
Troponin I (High Sensitivity): 169 ng/L (ref <18)

## 2021-09-10 LAB — CBC WITH DIFFERENTIAL/PLATELET
Abs Immature Granulocytes: 0.03 10*3/uL (ref 0.00–0.07)
Basophils Absolute: 0.1 10*3/uL (ref 0.0–0.1)
Basophils Relative: 1 %
Eosinophils Absolute: 0.1 10*3/uL (ref 0.0–0.5)
Eosinophils Relative: 1 %
HCT: 29.3 % — ABNORMAL LOW (ref 36.0–46.0)
Hemoglobin: 9.5 g/dL — ABNORMAL LOW (ref 12.0–15.0)
Immature Granulocytes: 0 %
Lymphocytes Relative: 24 %
Lymphs Abs: 2.6 10*3/uL (ref 0.7–4.0)
MCH: 27.5 pg (ref 26.0–34.0)
MCHC: 32.4 g/dL (ref 30.0–36.0)
MCV: 84.7 fL (ref 80.0–100.0)
Monocytes Absolute: 0.7 10*3/uL (ref 0.1–1.0)
Monocytes Relative: 7 %
Neutro Abs: 7.6 10*3/uL (ref 1.7–7.7)
Neutrophils Relative %: 67 %
Platelets: 662 10*3/uL — ABNORMAL HIGH (ref 150–400)
RBC: 3.46 MIL/uL — ABNORMAL LOW (ref 3.87–5.11)
RDW: 18.1 % — ABNORMAL HIGH (ref 11.5–15.5)
WBC: 11.1 10*3/uL — ABNORMAL HIGH (ref 4.0–10.5)
nRBC: 0 % (ref 0.0–0.2)

## 2021-09-10 LAB — RESP PANEL BY RT-PCR (FLU A&B, COVID) ARPGX2
Influenza A by PCR: NEGATIVE
Influenza B by PCR: NEGATIVE
SARS Coronavirus 2 by RT PCR: NEGATIVE

## 2021-09-10 LAB — BRAIN NATRIURETIC PEPTIDE: B Natriuretic Peptide: 1006.5 pg/mL — ABNORMAL HIGH (ref 0.0–100.0)

## 2021-09-10 MED ORDER — CLONIDINE HCL 0.1 MG PO TABS
0.2000 mg | ORAL_TABLET | Freq: Two times a day (BID) | ORAL | Status: DC
  Administered 2021-09-10 – 2021-09-11 (×2): 0.2 mg via ORAL
  Filled 2021-09-10 (×2): qty 2

## 2021-09-10 MED ORDER — HEPARIN (PORCINE) 25000 UT/250ML-% IV SOLN
650.0000 [IU]/h | INTRAVENOUS | Status: DC
  Administered 2021-09-10: 19:00:00 750 [IU]/h via INTRAVENOUS
  Filled 2021-09-10: qty 250

## 2021-09-10 MED ORDER — ZOLPIDEM TARTRATE 5 MG PO TABS
5.0000 mg | ORAL_TABLET | Freq: Every evening | ORAL | Status: DC | PRN
  Administered 2021-09-11 – 2021-09-13 (×3): 5 mg via ORAL
  Filled 2021-09-10 (×3): qty 1

## 2021-09-10 MED ORDER — PANTOPRAZOLE SODIUM 40 MG PO TBEC
40.0000 mg | DELAYED_RELEASE_TABLET | Freq: Every day | ORAL | Status: DC
  Administered 2021-09-11 – 2021-09-17 (×7): 40 mg via ORAL
  Filled 2021-09-10 (×7): qty 1

## 2021-09-10 MED ORDER — AMLODIPINE BESYLATE 10 MG PO TABS
10.0000 mg | ORAL_TABLET | Freq: Every morning | ORAL | Status: DC
  Administered 2021-09-11: 10 mg via ORAL
  Filled 2021-09-10: qty 1

## 2021-09-10 MED ORDER — ALBUTEROL SULFATE (2.5 MG/3ML) 0.083% IN NEBU: 3.0000 mL | INHALATION_SOLUTION | RESPIRATORY_TRACT | Status: DC | PRN

## 2021-09-10 MED ORDER — HYDROXYZINE HCL 25 MG PO TABS: 25.0000 mg | ORAL_TABLET | Freq: Three times a day (TID) | ORAL | Status: DC | PRN

## 2021-09-10 MED ORDER — DULOXETINE HCL 40 MG PO CPEP: 40.0000 mg | ORAL_CAPSULE | Freq: Every day | ORAL | Status: DC

## 2021-09-10 MED ORDER — ONDANSETRON HCL 4 MG PO TABS: 4.0000 mg | ORAL_TABLET | Freq: Four times a day (QID) | ORAL | Status: DC | PRN

## 2021-09-10 MED ORDER — UMECLIDINIUM BROMIDE 62.5 MCG/ACT IN AEPB
1.0000 | INHALATION_SPRAY | Freq: Every day | RESPIRATORY_TRACT | Status: DC
  Administered 2021-09-10 – 2021-09-17 (×7): 1 via RESPIRATORY_TRACT
  Filled 2021-09-10 (×2): qty 7

## 2021-09-10 MED ORDER — MEGESTROL ACETATE 40 MG PO TABS
40.0000 mg | ORAL_TABLET | Freq: Two times a day (BID) | ORAL | Status: DC
  Administered 2021-09-10 – 2021-09-17 (×14): 40 mg via ORAL
  Filled 2021-09-10 (×15): qty 1

## 2021-09-10 MED ORDER — OXYCODONE HCL 5 MG PO TABS
5.0000 mg | ORAL_TABLET | ORAL | Status: DC | PRN
  Administered 2021-09-10 – 2021-09-16 (×16): 5 mg via ORAL
  Filled 2021-09-10 (×16): qty 1

## 2021-09-10 MED ORDER — TECHNETIUM TO 99M ALBUMIN AGGREGATED
3.8000 | Freq: Once | INTRAVENOUS | Status: AC | PRN
  Administered 2021-09-10: 3.8 via INTRAVENOUS

## 2021-09-10 MED ORDER — DONEPEZIL HCL 5 MG PO TABS: 5.0000 mg | ORAL_TABLET | Freq: Every day | ORAL | Status: DC

## 2021-09-10 MED ORDER — GABAPENTIN 100 MG PO CAPS
200.0000 mg | ORAL_CAPSULE | Freq: Two times a day (BID) | ORAL | Status: DC
  Administered 2021-09-10 – 2021-09-12 (×4): 200 mg via ORAL
  Filled 2021-09-10 (×5): qty 2

## 2021-09-10 MED ORDER — DOCUSATE SODIUM 283 MG RE ENEM
1.0000 | ENEMA | RECTAL | Status: DC | PRN
  Filled 2021-09-10: qty 1

## 2021-09-10 MED ORDER — DOCUSATE SODIUM 100 MG PO CAPS
100.0000 mg | ORAL_CAPSULE | Freq: Two times a day (BID) | ORAL | Status: DC
  Administered 2021-09-15 – 2021-09-16 (×3): 100 mg via ORAL
  Filled 2021-09-10 (×9): qty 1

## 2021-09-10 MED ORDER — IPRATROPIUM-ALBUTEROL 0.5-2.5 (3) MG/3ML IN SOLN
3.0000 mL | Freq: Once | RESPIRATORY_TRACT | Status: AC
  Administered 2021-09-10: 3 mL via RESPIRATORY_TRACT
  Filled 2021-09-10: qty 3

## 2021-09-10 MED ORDER — HYDROXYZINE HCL 25 MG PO TABS
25.0000 mg | ORAL_TABLET | Freq: Every day | ORAL | Status: DC
  Administered 2021-09-10 – 2021-09-16 (×7): 25 mg via ORAL
  Filled 2021-09-10 (×7): qty 1

## 2021-09-10 MED ORDER — CALCIUM CARBONATE ANTACID 1250 MG/5ML PO SUSP
500.0000 mg | Freq: Four times a day (QID) | ORAL | Status: DC | PRN
  Filled 2021-09-10: qty 5

## 2021-09-10 MED ORDER — DICYCLOMINE HCL 20 MG PO TABS
20.0000 mg | ORAL_TABLET | Freq: Three times a day (TID) | ORAL | Status: DC
  Administered 2021-09-10 – 2021-09-17 (×19): 20 mg via ORAL
  Filled 2021-09-10 (×25): qty 1

## 2021-09-10 MED ORDER — NITROGLYCERIN 0.4 MG SL SUBL
0.4000 mg | SUBLINGUAL_TABLET | Freq: Once | SUBLINGUAL | Status: AC
  Administered 2021-09-10: 0.4 mg via SUBLINGUAL
  Filled 2021-09-10: qty 1

## 2021-09-10 MED ORDER — ACETAMINOPHEN 650 MG RE SUPP: 650.0000 mg | Freq: Four times a day (QID) | RECTAL | Status: DC | PRN

## 2021-09-10 MED ORDER — HEPARIN BOLUS VIA INFUSION
3000.0000 [IU] | Freq: Once | INTRAVENOUS | Status: AC
  Administered 2021-09-10: 3000 [IU] via INTRAVENOUS
  Filled 2021-09-10: qty 3000

## 2021-09-10 MED ORDER — SODIUM CHLORIDE 0.9% FLUSH
3.0000 mL | Freq: Two times a day (BID) | INTRAVENOUS | Status: DC
  Administered 2021-09-11 – 2021-09-15 (×8): 3 mL via INTRAVENOUS

## 2021-09-10 MED ORDER — NEPRO/CARBSTEADY PO LIQD
237.0000 mL | Freq: Three times a day (TID) | ORAL | Status: DC | PRN
  Filled 2021-09-10: qty 237

## 2021-09-10 MED ORDER — ACETAMINOPHEN 325 MG PO TABS
650.0000 mg | ORAL_TABLET | Freq: Four times a day (QID) | ORAL | Status: DC | PRN
  Administered 2021-09-15 – 2021-09-16 (×3): 650 mg via ORAL
  Filled 2021-09-10 (×3): qty 2

## 2021-09-10 MED ORDER — CAMPHOR-MENTHOL 0.5-0.5 % EX LOTN
1.0000 "application " | TOPICAL_LOTION | Freq: Three times a day (TID) | CUTANEOUS | Status: DC | PRN
  Filled 2021-09-10: qty 222

## 2021-09-10 MED ORDER — FLUTICASONE FUROATE-VILANTEROL 100-25 MCG/ACT IN AEPB
1.0000 | INHALATION_SPRAY | Freq: Every day | RESPIRATORY_TRACT | Status: DC
  Administered 2021-09-10 – 2021-09-17 (×7): 1 via RESPIRATORY_TRACT
  Filled 2021-09-10: qty 28

## 2021-09-10 MED ORDER — MAGNESIUM OXIDE -MG SUPPLEMENT 400 (240 MG) MG PO TABS
400.0000 mg | ORAL_TABLET | Freq: Two times a day (BID) | ORAL | Status: DC
  Administered 2021-09-10 – 2021-09-11 (×2): 400 mg via ORAL
  Filled 2021-09-10 (×2): qty 1

## 2021-09-10 MED ORDER — CLOPIDOGREL BISULFATE 75 MG PO TABS
75.0000 mg | ORAL_TABLET | Freq: Every day | ORAL | Status: DC
  Administered 2021-09-11 – 2021-09-17 (×7): 75 mg via ORAL
  Filled 2021-09-10 (×7): qty 1

## 2021-09-10 MED ORDER — SORBITOL 70 % SOLN
30.0000 mL | Status: DC | PRN
  Filled 2021-09-10: qty 30

## 2021-09-10 MED ORDER — CARVEDILOL 12.5 MG PO TABS
12.5000 mg | ORAL_TABLET | Freq: Two times a day (BID) | ORAL | Status: DC
  Administered 2021-09-10 – 2021-09-17 (×13): 12.5 mg via ORAL
  Filled 2021-09-10 (×13): qty 1

## 2021-09-10 MED ORDER — BUDESON-GLYCOPYRROL-FORMOTEROL 160-9-4.8 MCG/ACT IN AERO
2.0000 | INHALATION_SPRAY | Freq: Two times a day (BID) | RESPIRATORY_TRACT | Status: DC
Start: 2021-09-10 — End: 2021-09-10

## 2021-09-10 MED ORDER — ASPIRIN EC 81 MG PO TBEC
81.0000 mg | DELAYED_RELEASE_TABLET | Freq: Every day | ORAL | Status: DC
  Administered 2021-09-11 – 2021-09-17 (×7): 81 mg via ORAL
  Filled 2021-09-10 (×7): qty 1

## 2021-09-10 MED ORDER — SODIUM BICARBONATE 650 MG PO TABS
1300.0000 mg | ORAL_TABLET | Freq: Two times a day (BID) | ORAL | Status: DC
  Administered 2021-09-10 – 2021-09-17 (×14): 1300 mg via ORAL
  Filled 2021-09-10 (×14): qty 2

## 2021-09-10 MED ORDER — ONDANSETRON HCL 4 MG/2ML IJ SOLN
4.0000 mg | Freq: Four times a day (QID) | INTRAMUSCULAR | Status: DC | PRN
  Administered 2021-09-10 – 2021-09-14 (×2): 4 mg via INTRAVENOUS
  Filled 2021-09-10 (×2): qty 2

## 2021-09-10 MED ORDER — HYDRALAZINE HCL 20 MG/ML IJ SOLN
5.0000 mg | INTRAMUSCULAR | Status: DC | PRN
  Administered 2021-09-10: 5 mg via INTRAVENOUS
  Filled 2021-09-10: qty 1

## 2021-09-10 MED ORDER — POTASSIUM CHLORIDE 10 MEQ/100ML IV SOLN
10.0000 meq | INTRAVENOUS | Status: AC
  Administered 2021-09-10 (×2): 10 meq via INTRAVENOUS
  Filled 2021-09-10 (×2): qty 100

## 2021-09-10 MED ORDER — LABETALOL HCL 5 MG/ML IV SOLN
10.0000 mg | Freq: Once | INTRAVENOUS | Status: AC
  Administered 2021-09-10: 10 mg via INTRAVENOUS
  Filled 2021-09-10: qty 4

## 2021-09-10 NOTE — ED Notes (Signed)
Pt O2 dropped to 68% on 4LPM. Pt switched back to nonrebreather at 15lpm. Dr.Dykstra at bedside.

## 2021-09-10 NOTE — H&P (Signed)
History and Physical    Alyssa [name] WIO:[id number] DOB: 1940/10/27 DOA: 09/10/2021  PCP: System, Provider Not In Consultants:  Melvyn Novas - pulmonology; Delice Lesch - neurology; Claiborne Billings - cardiology Patient coming from: Home - lives with grandchildren (recently discharged from [facility]); NOK: Kriya, [place], [phone]  Chief Complaint: SOB  HPI: Alyssa [name] is a 80 y.o. female with medical history significant of HTN; HLD; PVD; stage 4 CKD; COPD; dementia and prior prolonged admission from 8/29-9/23 acute respiratory failure associated with LLL collapse with COPD and active herpes zoster infection.    She reports 2-3 days of mostly SOB with mild left-sided chest discomfort.  Maybe fever.  + abdominal pain.      ED Course: Concern for PE.  Came in for CP/SOB since yesterday.  Hypoxic to 70-80s, on 4L and weaned to 2L.  +tachycardia, tachypnea with talking.  Troponin 188, CXR clear.  Ordered VQ scan and heparin.  BNP 1000 but does not appear volume overloaded.  Given Labetalol for marked BP elevation.    Review of Systems: As per HPI; otherwise review of systems reviewed and negative.   Ambulatory Status:  Ambulates with a walker  COVID Vaccine Status:   Complete  Past Medical History:  Diagnosis Date   Acute respiratory failure with hypoxia ([place]) 11/01/2019   Anxiety    Arthritis    Chest pain    Chronic kidney disease    Dyspnea    Elevated troponin 12/13/2018   Headache(784.0)    Hyperlipidemia    Hypertension    Joint pain    Leg pain    Peripheral neuropathy    Peripheral vascular disease ([place])    Syncope 09/2020    Past Surgical History:  Procedure Laterality Date   ABDOMINAL ANGIOGRAM N/A 10/29/2011   Procedure: ABDOMINAL ANGIOGRAM;  Surgeon: Elam Dutch, MD;  Location: [facility] CATH LAB;  Service: Cardiovascular;  Laterality: N/A;   BACK SURGERY     COLONOSCOPY     ESOPHAGOGASTRODUODENOSCOPY     FEMORAL-FEMORAL BYPASS GRAFT  08/31/2010    FLEXIBLE SIGMOIDOSCOPY N/A 06/12/2019   Procedure: FLEXIBLE SIGMOIDOSCOPY;  Surgeon: Ladene Artist, MD;  Location: [place];  Service: Endoscopy;  Laterality: N/A;  Patient had little bit to eat this morning so this will be unsedated.   JOINT REPLACEMENT Right 10/18/2010   Hip   LOWER EXTREMITY ANGIOGRAPHY  06/14/2017   Procedure: Lower Extremity Angiography;  Surgeon: Adrian Prows, MD;  Location: [place] CV LAB;  Service: Cardiovascular;;  Bilateral limited angio performed   RENAL ANGIOGRAPHY N/A 06/14/2017   Procedure: Renal Angiography;  Surgeon: Adrian Prows, MD;  Location: [place] CV LAB;  Service: Cardiovascular;  Laterality: N/A;   TOTAL HIP ARTHROPLASTY     right    Social History   Socioeconomic History   Marital status: Single    Spouse name: Not on file   Number of children: 2   Years of education: 11th   Highest education level: Not on file  Occupational History   Occupation: Retired  Tobacco Use   Smoking status: Former    Packs/day: 1.00    Years: 50.00    Pack years: 50.00    Types: Cigarettes    Quit date: 04/22/2021    Years since quitting: 0.3   Smokeless tobacco: Never   Tobacco comments:    Quit 3 months ago 07/23/21 [facility]  Vaping Use   Vaping Use: Never used  Substance and Sexual Activity   Alcohol  use: No   Drug use: No   Sexual activity: Not Currently  Other Topics Concern   Not on file  Social History Narrative    Lives at home with her brother.   Right-handed.   Occasional caffeine use.   Social Determinants of Health   Financial Resource Strain: Not on file  Food Insecurity: Not on file  Transportation Needs: Not on file  Physical Activity: Not on file  Stress: Not on file  Social Connections: Not on file  Intimate Partner Violence: Not on file    No Known Allergies  Family History  Problem Relation Age of Onset   Heart attack Mother    Heart disease Mother    Hyperlipidemia Mother    Hypertension Mother    Heart  attack Father    Heart disease Father        Before age 32   Hyperlipidemia Father    Hypertension Father    Cancer Sister        brain tumor   Aneurysm Brother        brain   Colon cancer Neg Hx    Liver cancer Neg Hx     Prior to Admission medications   Medication Sig Start Date End Date Taking? Authorizing Provider  acetaminophen (TYLENOL) 325 MG tablet Take 2 tablets (650 mg total) by mouth every 6 (six) hours as needed for mild pain, moderate pain or fever (or Fever >/= 101). Patient taking differently: Take 650 mg by mouth every 6 (six) hours as needed for mild pain. 06/16/19   Hongalgi, Lenis Dickinson, MD  albuterol (VENTOLIN HFA) 108 (90 Base) MCG/ACT inhaler Inhale 2 puffs into the lungs every 4 (four) hours as needed for wheezing or shortness of breath.     [provider]  amLODipine (NORVASC) 10 MG tablet Take 1 tablet (10 mg total) by mouth at bedtime. Patient taking differently: Take 10 mg by mouth every morning. 07/10/21   Antonieta Pert, MD  aspirin 81 MG EC tablet Take 1 tablet (81 mg total) by mouth daily. 01/09/19   Caren Griffins, MD  Budeson-Glycopyrrol-Formoterol (BREZTRI AEROSPHERE) 160-9-4.8 MCG/ACT AERO Inhale 2 puffs into the lungs 2 (two) times daily. 07/23/21   Tanda Rockers, MD  Budeson-Glycopyrrol-Formoterol (BREZTRI AEROSPHERE) 160-9-4.8 MCG/ACT AERO Inhale 2 puffs into the lungs 2 (two) times daily. Patient not taking: No sig reported 07/23/21   Tanda Rockers, MD  carvedilol (COREG) 12.5 MG tablet Take 12.5 mg by mouth 2 (two) times daily. 09/05/20   [provider]  cloNIDine (CATAPRES) 0.2 MG tablet Take 0.2 mg by mouth at bedtime. 05/25/20   [provider]  clopidogrel (PLAVIX) 75 MG tablet Take 1 tablet (75 mg total) by mouth daily. 01/09/19   Caren Griffins, MD  diclofenac Sodium (VOLTAREN) 1 % GEL Apply 2 g topically every 6 (six) hours as needed (knee pain). 01/15/20   [provider]  dicyclomine (BENTYL) 20 MG tablet Take  20 mg by mouth 3 (three) times daily. 04/01/20   [provider]  docusate sodium (COLACE) 100 MG capsule Take 1 capsule (100 mg total) by mouth daily. 07/11/21   Antonieta Pert, MD  donepezil (ARICEPT) 5 MG tablet Take 1 tablet (5 mg total) by mouth daily. Patient taking differently: Take 5 mg by mouth at bedtime. 11/20/20   Cameron Sprang, MD  DULoxetine 40 MG CPEP Take 40 mg by mouth daily. 07/11/21   Antonieta Pert, MD  Ensure Plus (ENSURE  PLUS) LIQD Take by mouth 2 (two) times daily.    [provider]  Ferrous Sulfate (IRON HIGH-POTENCY) 325 MG TABS Take 325 mg by mouth daily with breakfast. 06/30/19   Hongalgi, Lenis Dickinson, MD  gabapentin (NEURONTIN) 100 MG capsule Take 200 mg by mouth 2 (two) times daily. 07/23/21   [provider]  hydrocortisone (ANUSOL-HC) 25 MG suppository Place 1 suppository (25 mg total) rectally 2 (two) times daily. Patient not taking: No sig reported 07/10/21   Antonieta Pert, MD  hydrOXYzine (ATARAX/VISTARIL) 25 MG tablet Take 25 mg by mouth at bedtime. 05/12/20   [provider]  magnesium oxide (MAG-OX) 400 MG tablet Take 400 mg by mouth 2 (two) times daily.    [provider]  megestrol (MEGACE) 40 MG tablet Take 40 mg by mouth 2 (two) times daily. 05/12/20   [provider]  Multiple Vitamin (MULTIVITAMIN WITH MINERALS) TABS tablet Take 1 tablet by mouth daily. 06/17/19   Hongalgi, Lenis Dickinson, MD  nitroGLYCERIN (NITROSTAT) 0.4 MG SL tablet Place 1 tablet (0.4 mg total) under the tongue every 5 (five) minutes as needed for chest pain. 01/07/20   Allie Bossier, MD  ondansetron (ZOFRAN ODT) 4 MG disintegrating tablet Take 1 tablet (4 mg total) by mouth every 4 (four) hours as needed for nausea or vomiting. 06/09/20   Charlesetta Shanks, MD  oxyCODONE (OXY IR/ROXICODONE) 5 MG immediate release tablet Take 1 tablet (5 mg total) by mouth every 8 (eight) hours as needed for up to 10 doses for moderate pain. Patient not taking: Reported on  07/25/2021 07/10/21   Antonieta Pert, MD  pantoprazole (PROTONIX) 40 MG tablet Take 1 tablet (40 mg total) by mouth daily. 06/16/19   Hongalgi, Lenis Dickinson, MD  pregabalin (LYRICA) 75 MG capsule Take 1 capsule (75 mg total) by mouth daily. 07/11/21 08/10/21  Antonieta Pert, MD  sodium bicarbonate 650 MG tablet Take 2 tablets (1,300 mg total) by mouth 2 (two) times daily. 01/07/20   Allie Bossier, MD  sodium zirconium cyclosilicate (LOKELMA) 5 g packet Take 5 g by mouth daily. Patient not taking: Reported on 07/25/2021 07/10/21   Antonieta Pert, MD  Vitamin D, Ergocalciferol, (DRISDOL) 1.25 MG (50000 UNIT) CAPS capsule Take 50,000 Units by mouth every Monday.    [provider]    Physical Exam: Vitals:   09/10/21 1430 09/10/21 1431 09/10/21 1500 09/10/21 1600  BP: (!) 199/105  (!) 205/80 (!) 205/108  Pulse: 77  60 65  Resp: 15  18 20   Temp: 97.8 F (36.6 C)     TempSrc: Oral     SpO2: 94%  100% 100%  Weight:  43.1 kg    Height:  5\' 3"  (1.6 m)       General:  Appears calm and comfortable and is in NAD Eyes:  PERRL, EOMI, normal lids, iris ENT:  grossly normal hearing, lips & tongue, mmm; absent upper dentition and poor lower dentition Neck:  no LAD, masses or thyromegaly Cardiovascular:  RRR, no m/r/g. No LE edema.  Respiratory:   CTA bilaterally with no wheezes/rales/rhonchi.  Normal respiratory effort. Abdomen:  soft, diffusely TTP, ND Skin:  no rash or induration seen on limited exam Musculoskeletal:  RLE is significantly shorter than the left (reports this is chronic after R hip surgery); negative Homan's, no LE edema  Psychiatric:  blunted mood and affect, speech fluent and appropriate, AOx2 Neurologic:  CN 2-12 grossly intact, moves all extremities in coordinated fashion  Radiological Exams on Admission: Independently reviewed - see discussion in A/P where applicable  DG Chest Portable 1 View  Result Date: 09/10/2021 CLINICAL DATA:  Chest pain and shortness of breath. EXAM:  PORTABLE CHEST 1 VIEW COMPARISON:  July 25, 2021 FINDINGS: The heart size and mediastinal contours are stable. The aorta is tortuous. Both lungs are clear. The visualized skeletal structures are stable. IMPRESSION: No active disease. Electronically Signed   By: Abelardo Diesel M.D.   On: 09/10/2021 15:31    EKG: Independently reviewed.  NSR with rate 70; nonspecific ST changes with NSCSLT   Labs on Admission: I have personally reviewed the available labs and imaging studies at the time of the admission.  Pertinent labs:   ABG: 7.456/26.3/131 K+ 2.3 CO2 18 BUN 36/Creatinine 2.78/GFR 17 - stable Anion gap 22 Albumin 3.1 AST 47/ALT 32 BNP 1006.5 Troponin 188; 58 on 07/25/21 COVID/flu negative   Assessment/Plan Principal Problem:   Shortness of breath Active Problems:   CKD (chronic kidney disease) stage 4, GFR 15-29 ml/min (HCC)   DNR (do not resuscitate)   Dementia ([place])   Essential hypertension   COPD  GOLD 0/ still smoking    Malnutrition of moderate degree   Hypertensive crisis   SOB -Ddx include PE, ACS, and hypertensive crisis -She appears calm and comfortable and has weaned down to 3L [place] O2 -Will observe on progressive care for now -ER started heparin drip, will continue -Will trend troponin  -VQ scan is pending -No LE edema or other suggestion of LE DVT so will not order dopplers for now  Hypertensive crisis -Patient presenting with marked HTN in conjunction with SOB and some CP, concerning for hypertensive crisis -She did have evidence of end organ failure with elevated troponin and BNP (but negative CXR) -The initial goal of therapy would generally be to decrease the MAP by no more than 25% in the first hour and then continue to decrease the BP additionally within the next 2-6 hours -The patient received Labetalol IV x 1 in the ER with appropriate improvement, currently 188/108 -Will cycle troponin and request cardiology consultation -Will continue Norvasc,  Coreg, Catapres (increase to BID) -Will continue ASA, Plavix -Will add prn IV hydralazine   PVD -Continue ASA, Plavix  Stage 4 CKD -Appears to be stable at this time -Will follow -Continue Bicarbonate  COPD -Continue Breztri  Dementia -Continue Aricept, Duloxetine, hydroxyzine -delirium precautions  Malnutrition -Body mass index is 16.83 kg/m..  -The patient has at least 2 indicators for malnutrition (insufficient energy intake, loss of muscle mass, loss of subcutaneous fat -This is likely due to starvation related/chronic disease-Will obtain a nutrition consult for further recommendations.  -Continue Megace  DNR -I have discussed code status with the patient and reviewed her ACP documents; the patient would not desire resuscitation and would prefer to die a natural death should that situation arise. -She will need a gold out of facility DNR form at the time of discharge      Note: This patient has been tested and is negative for the novel coronavirus COVID-19. The patient has been fully vaccinated against COVID-19.   Level of care: Progressive DVT prophylaxis: Heparin Code Status:  DNR - confirmed with patient/ACP documents Family Communication: None present; I spoke with the patient's son by telephone at the time of admission. Disposition Plan:  The patient is from: home  Anticipated d/c is to: be determined  Anticipated d/c date will depend on clinical response to treatment, but possibly as  early as tomorrow if she has excellent response to treatment  Patient is currently: acutely ill Consults called: Cardiology; nutrition  Admission status:  It is my clinical opinion that referral for OBSERVATION is reasonable and necessary in this patient based on the above information provided. The aforementioned taken together are felt to place the patient at high risk for further clinical deterioration. However it is anticipated that the patient may be medically stable for  discharge from the hospital within 24 to 48 hours.    Karmen Bongo MD [facility]   How to contact the [facility] Attending or Consulting provider [place] or covering provider during after hours [place], for this patient?  Check the care team in [facility] and look for a) attending/consulting TR[facility] provider listed and b) the [facility] team listed Log into [email or web address] and use [place]'s universal password to access. If you do not have the password, please contact the hospital operator. Locate the [facility] provider you are looking for under [facility] and page to a number that you can be directly reached. If you still have difficulty reaching the provider, please page the [facility] (Director on Call) for the Hospitalists listed on amion for assistance.   09/10/2021, 5:44 PM

## 2021-09-10 NOTE — ED Provider Notes (Signed)
[facility] EMERGENCY DEPARTMENT Provider Note   CSN: [id number] Arrival date & time: 09/10/21  1419     History Chief Complaint  Patient presents with   Shortness of Breath   Alyssa [name] is a 80 y.o. female who presents via EMS for shortness of breath and chest pain x2 days.  Was tachypneic to the 30s with oxygen saturation of 79% on room air.  Initially required 4 L O2 by nasal cannula, weaned to 2 L.  Patient states that 2 days ago she began to experience central chest pain with progressively worsening shortness of breath.  Does not wear oxygen at baseline.  Has COPD but states that she has never had an exacerbation or had to be hospitalized for this.  She denies any history of congestive heart failure.  She denies any fevers or chills or recent ill contacts.  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  I have personally reviewed this patient's medical records.She has history of COPD, CAD, hypertension, hypertensive urgency in the past, NSTEMI, and subarachnoid hemorrhage in 2020 which did not appear to have been traumatic at the time.  Patient continues to smoke 1/2 pack cigarettes per 2 days.  She is anticoagulated on aspirin and Plavix.  She denies any recent prolonged immobilization, surgeries, history of blood clot, or hormone replacement at this time. HPI     Past Medical History:  Diagnosis Date   Acute respiratory failure with hypoxia ([place]) 11/01/2019   Anxiety    Arthritis    Chest pain    Chronic kidney disease    Dyspnea    Elevated troponin 12/13/2018   Headache(784.0)    Hyperlipidemia    Hypertension    Joint pain    Leg pain    Peripheral neuropathy    Peripheral vascular disease ([place])    Syncope 09/2020    Patient Active Problem List   Diagnosis Date Noted   Hypertensive crisis 09/10/2021   Rectal bleeding    Rectal pain    Malnutrition of  moderate degree 06/17/2021   Weight loss 06/16/2021   Zoster 06/16/2021   Collapsed lung    COPD  GOLD 0/ still smoking  09/26/2020   Cigarette smoker 09/26/2020   Shortness of breath 07/06/2020   Essential hypertension 07/06/2020   Abdominal pain 07/06/2020   Elevated INR 07/06/2020   Acute respiratory failure due to COVID-19 ([place]) 06/02/2020   Acute renal failure superimposed on chronic kidney disease ([place]) 02/06/2020   Leukocytosis 02/06/2020   Thrombocytosis 02/06/2020   Palliative care encounter    Constipation 01/05/2020   Protein-calorie malnutrition, severe ([place]) 01/04/2020   Acute on chronic renal failure ([place]) 01/04/2020   Hyperkalemia 01/03/2020   AKI (acute kidney injury) ([place]) 01/03/2020   Acute respiratory failure with hypoxia ([place]) 11/01/2019   UTI (urinary tract infection) 11/01/2019   CAP (community acquired pneumonia) 11/01/2019   Acute cystitis without hematuria    Encephalopathy 09/18/2019   SAH (subarachnoid hemorrhage) ([place]) 09/18/2019   Dementia ([place]) 75/64/3329   Metabolic acidosis 51/88/4166   ARF (acute renal failure) ([place]) 09/17/2019   Acute encephalopathy 09/17/2019   Abnormal CT scan, colon    Diarrhea    Diverticulitis 06/07/2019   Depression with anxiety 06/07/2019   DNR (do not resuscitate) 03/23/2019   Volume overload 03/23/2019   Edema 03/23/2019   Acute renal failure (ARF) ([place]) 03/08/2019   Hypokalemia 03/08/2019   Palliative care  by specialist    Goals of care, counseling/discussion    Altered mental status    Encephalopathy, toxic 12/24/2018   Aspiration pneumonia of both lower lobes due to gastric secretions (HCC) 12/24/2018   Non-ST elevation (NSTEMI) myocardial infarction [facility])    Chest pain    Dyspnea on exertion    Elevated troponin level 12/09/2018   Chronic migraine 03/27/2018   Lumbar radiculopathy 03/27/2018   Gait abnormality 03/27/2018   Hypertensive urgency 12/28/2016   CKD (chronic kidney disease) stage 4, GFR  15-29 ml/min (HCC) 12/28/2016   Anemia 12/28/2016   Chronic pain syndrome 12/28/2016   Benzodiazepine withdrawal without complication ([place]) 46/96/2952   Chronic right shoulder pain 12/06/2016   Fall 09/17/2016   Fracture of humeral head, closed, right, initial encounter 09/17/2016   Rib fractures 09/17/2016   Multiple falls 09/17/2016   Severe muscle deconditioning 09/17/2016   Failure to thrive in adult 09/17/2016   Melena 04/25/2016   Transient hypotension 04/25/2016   Anxiety state 04/25/2016   Tobacco abuse 04/25/2016   Hyperlipidemia 04/25/2016   Syncope 04/24/2016   Near syncope 04/24/2016   Gait difficulty 01/12/2016   Foot drop, left 01/12/2016   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   Spondylolisthesis at L4-L5 level 07/30/2015   PVD (peripheral vascular disease) ([place]) 07/29/2014   Weakness-Bilateral arm/leg 07/29/2014   Numbness-left leg 07/29/2014   Swelling of limb-Legs 07/29/2014   Atherosclerosis of native arteries of the extremities with intermittent claudication 09/30/2011    Past Surgical History:  Procedure Laterality Date   ABDOMINAL ANGIOGRAM N/A 10/29/2011   Procedure: ABDOMINAL Cyril Loosen;  Surgeon: Elam Dutch, MD;  [facility] CATH LAB;  Service: Cardiovascular;  Laterality: N/A;   BACK SURGERY     COLONOSCOPY     ESOPHAGOGASTRODUODENOSCOPY     FEMORAL-FEMORAL BYPASS GRAFT  08/31/2010   FLEXIBLE SIGMOIDOSCOPY N/A 06/12/2019   Procedure: FLEXIBLE SIGMOIDOSCOPY;  Surgeon: Ladene Artist, MD;  Location: [facility];  Service: Endoscopy;  Laterality: N/A;  Patient had little bit to eat this morning so this will be unsedated.   JOINT REPLACEMENT Right 10/18/2010   Hip   LOWER EXTREMITY ANGIOGRAPHY  06/14/2017   Procedure: Lower Extremity Angiography;  Surgeon: Adrian Prows, MD;  Location: [facility] CV LAB;  Service: Cardiovascular;;  Bilateral limited angio performed   RENAL ANGIOGRAPHY N/A 06/14/2017    Procedure: Renal Angiography;  Surgeon: Adrian Prows, MD;  Location: [facility] CV LAB;  Service: Cardiovascular;  Laterality: N/A;   TOTAL HIP ARTHROPLASTY     right     OB History   No obstetric history on file.     Family History  Problem Relation Age of Onset   Heart attack Mother    Heart disease Mother    Hyperlipidemia Mother    Hypertension Mother    Heart attack Father    Heart disease Father        Before age 24   Hyperlipidemia Father    Hypertension Father    Cancer Sister        brain tumor   Aneurysm Brother        brain   Colon cancer Neg Hx    Liver cancer Neg Hx     Social History   Tobacco Use   Smoking status: Former    Packs/day: 1.00    Years: 50.00    Pack years: 50.00    Types: Cigarettes    Quit date: 04/22/2021  Years since quitting: 0.3   Smokeless tobacco: Never   Tobacco comments:    Quit 3 months ago 07/23/21 [facility]  Vaping Use   Vaping Use: Never used  Substance Use Topics   Alcohol use: No   Drug use: No    Home Medications Prior to Admission medications   Medication Sig Start Date End Date Taking? Authorizing Provider  acetaminophen (TYLENOL) 325 MG tablet Take 2 tablets (650 mg total) by mouth every 6 (six) hours as needed for mild pain, moderate pain or fever (or Fever >/= 101). Patient taking differently: Take 650 mg by mouth every 6 (six) hours as needed for mild pain. 06/16/19   Hongalgi, Lenis Dickinson, MD  albuterol (VENTOLIN HFA) 108 (90 Base) MCG/ACT inhaler Inhale 2 puffs into the lungs every 4 (four) hours as needed for wheezing or shortness of breath.     [provider]  amLODipine (NORVASC) 10 MG tablet Take 1 tablet (10 mg total) by mouth at bedtime. Patient taking differently: Take 10 mg by mouth every morning. 07/10/21   Antonieta Pert, MD  aspirin 81 MG EC tablet Take 1 tablet (81 mg total) by mouth daily. 01/09/19   Caren Griffins, MD  Budeson-Glycopyrrol-Formoterol (BREZTRI AEROSPHERE) 160-9-4.8 MCG/ACT AERO Inhale  2 puffs into the lungs 2 (two) times daily. 07/23/21   Tanda Rockers, MD  Budeson-Glycopyrrol-Formoterol (BREZTRI AEROSPHERE) 160-9-4.8 MCG/ACT AERO Inhale 2 puffs into the lungs 2 (two) times daily. Patient not taking: No sig reported 07/23/21   Tanda Rockers, MD  carvedilol (COREG) 12.5 MG tablet Take 12.5 mg by mouth 2 (two) times daily. 09/05/20   [provider]  cloNIDine (CATAPRES) 0.2 MG tablet Take 0.2 mg by mouth at bedtime. 05/25/20   [provider]  clopidogrel (PLAVIX) 75 MG tablet Take 1 tablet (75 mg total) by mouth daily. 01/09/19   Caren Griffins, MD  diclofenac Sodium (VOLTAREN) 1 % GEL Apply 2 g topically every 6 (six) hours as needed (knee pain). 01/15/20   [provider]  dicyclomine (BENTYL) 20 MG tablet Take 20 mg by mouth 3 (three) times daily. 04/01/20   [provider]  docusate sodium (COLACE) 100 MG capsule Take 1 capsule (100 mg total) by mouth daily. 07/11/21   Antonieta Pert, MD  donepezil (ARICEPT) 5 MG tablet Take 1 tablet (5 mg total) by mouth daily. Patient taking differently: Take 5 mg by mouth at bedtime. 11/20/20   Cameron Sprang, MD  DULoxetine 40 MG CPEP Take 40 mg by mouth daily. 07/11/21   Antonieta Pert, MD  Ensure Plus (ENSURE PLUS) LIQD Take by mouth 2 (two) times daily.    [provider]  Ferrous Sulfate (IRON HIGH-POTENCY) 325 MG TABS Take 325 mg by mouth daily with breakfast. 06/30/19   Hongalgi, Lenis Dickinson, MD  gabapentin (NEURONTIN) 100 MG capsule Take 200 mg by mouth 2 (two) times daily. 07/23/21   [provider]  hydrocortisone (ANUSOL-HC) 25 MG suppository Place 1 suppository (25 mg total) rectally 2 (two) times daily. Patient not taking: No sig reported 07/10/21   Antonieta Pert, MD  hydrOXYzine (ATARAX/VISTARIL) 25 MG tablet Take 25 mg by mouth at bedtime. 05/12/20   [provider]  magnesium oxide (MAG-OX) 400 MG tablet Take 400 mg by mouth 2 (two) times daily.    [provider]   megestrol (MEGACE) 40 MG tablet Take 40 mg by mouth 2 (two) times daily. 05/12/20   [provider]  Multiple Vitamin (  MULTIVITAMIN WITH MINERALS) TABS tablet Take 1 tablet by mouth daily. 06/17/19   Hongalgi, Lenis Dickinson, MD  nitroGLYCERIN (NITROSTAT) 0.4 MG SL tablet Place 1 tablet (0.4 mg total) under the tongue every 5 (five) minutes as needed for chest pain. 01/07/20   Allie Bossier, MD  ondansetron (ZOFRAN ODT) 4 MG disintegrating tablet Take 1 tablet (4 mg total) by mouth every 4 (four) hours as needed for nausea or vomiting. 06/09/20   Charlesetta Shanks, MD  oxyCODONE (OXY IR/ROXICODONE) 5 MG immediate release tablet Take 1 tablet (5 mg total) by mouth every 8 (eight) hours as needed for up to 10 doses for moderate pain. Patient not taking: Reported on 07/25/2021 07/10/21   Antonieta Pert, MD  pantoprazole (PROTONIX) 40 MG tablet Take 1 tablet (40 mg total) by mouth daily. 06/16/19   Hongalgi, Lenis Dickinson, MD  pregabalin (LYRICA) 75 MG capsule Take 1 capsule (75 mg total) by mouth daily. 07/11/21 08/10/21  Antonieta Pert, MD  sodium bicarbonate 650 MG tablet Take 2 tablets (1,300 mg total) by mouth 2 (two) times daily. 01/07/20   Allie Bossier, MD  sodium zirconium cyclosilicate (LOKELMA) 5 g packet Take 5 g by mouth daily. Patient not taking: Reported on 07/25/2021 07/10/21   Antonieta Pert, MD  Vitamin D, Ergocalciferol, (DRISDOL) 1.25 MG (50000 UNIT) CAPS capsule Take 50,000 Units by mouth every Monday.    [provider]    Allergies    Patient has no known allergies.  Review of Systems   Review of Systems  Constitutional:  Positive for fatigue. Negative for activity change, appetite change, diaphoresis and fever.  HENT: Negative.    Eyes: Negative.   Respiratory:  Positive for shortness of breath.   Cardiovascular:  Positive for chest pain. Negative for palpitations and leg swelling.  Gastrointestinal: Negative.   Genitourinary: Negative.   Musculoskeletal: Negative.   Neurological:   Positive for weakness. Negative for dizziness, syncope, facial asymmetry, light-headedness and headaches.   Physical Exam Updated Vital Signs BP (!) 205/108   Pulse 65   Temp 97.8 F (36.6 C) (Oral)   Resp 20   Ht 5\' 3"  (1.6 m)   Wt 43.1 kg   SpO2 100%   BMI 16.83 kg/m   Physical Exam Vitals and nursing note reviewed.  Constitutional:      Appearance: She is not toxic-appearing.  HENT:     Head: Normocephalic and atraumatic.     Nose: Nose normal.     Mouth/Throat:     Mouth: Mucous membranes are moist.     Pharynx: Oropharynx is clear. Uvula midline. No oropharyngeal exudate or posterior oropharyngeal erythema.     Tonsils: No tonsillar exudate.  Eyes:     General: Lids are normal. Vision grossly intact.        Right eye: No discharge.        Left eye: No discharge.     Extraocular Movements: Extraocular movements intact.     Conjunctiva/sclera: Conjunctivae normal.     Pupils: Pupils are equal, round, and reactive to light.  Neck:     Trachea: Trachea and phonation normal.  Cardiovascular:     Rate and Rhythm: Normal rate and regular rhythm.     Pulses:          Dorsalis pedis pulses are 1+ on the right side and 1+ on the left side.     Heart sounds: Normal heart sounds.  Pulmonary:     Effort: Pulmonary effort is normal.  No tachypnea, bradypnea, accessory muscle usage, prolonged expiration, respiratory distress or retractions.     Breath sounds: Examination of the right-lower field reveals rhonchi. Rhonchi present. No wheezing or rales.     Comments: On 4L O2 by [place], weaned to 2L while I was in the room. Chest:     Chest wall: No mass, lacerations, deformity, swelling, tenderness, crepitus or edema.  Abdominal:     General: Bowel sounds are normal. There is no distension.     Palpations: Abdomen is soft.     Tenderness: There is no abdominal tenderness. There is no right CVA tenderness, left CVA tenderness, guarding or rebound.    Musculoskeletal:         General: No deformity.     Cervical back: Normal range of motion and neck supple. No edema, rigidity or crepitus. No pain with movement, spinous process tenderness or muscular tenderness.     Right lower leg: No edema.     Left lower leg: No edema.  Lymphadenopathy:     Cervical: No cervical adenopathy.  Skin:    General: Skin is warm and dry.     Capillary Refill: Capillary refill takes less than 2 seconds.  Neurological:     General: No focal deficit present.     Mental Status: She is alert and oriented to person, place, and time. Mental status is at baseline.     GCS: GCS eye subscore is 4. GCS verbal subscore is 5. GCS motor subscore is 6.     Cranial Nerves: Cranial nerves 2-12 are intact.     Sensory: Sensation is intact.     Motor: Motor function is intact.  Psychiatric:        Mood and Affect: Mood normal.   ED Results / Procedures / Treatments   Labs (all labs ordered are listed, but only abnormal results are displayed) Labs Reviewed  CBC WITH DIFFERENTIAL/PLATELET - Abnormal; Notable for the following components:      Result Value   WBC 11.1 (*)    RBC 3.46 (*)    Hemoglobin 9.5 (*)    HCT 29.3 (*)    RDW 18.1 (*)    Platelets 662 (*)    All other components within normal limits  COMPREHENSIVE METABOLIC PANEL - Abnormal; Notable for the following components:   Potassium 2.3 (*)    CO2 18 (*)    BUN 35 (*)    Creatinine, Ser 2.78 (*)    Albumin 3.1 (*)    AST 47 (*)    GFR, Estimated 17 (*)    Anion gap 22 (*)    All other components within normal limits  BRAIN NATRIURETIC PEPTIDE - Abnormal; Notable for the following components:   B Natriuretic Peptide 1,006.5 (*)    All other components within normal limits  I-STAT ARTERIAL BLOOD GAS, ED - Abnormal; Notable for the following components:   pH, Arterial 7.456 (*)    pCO2 arterial 26.3 (*)    pO2, Arterial 131 (*)    Bicarbonate 18.7 (*)    TCO2 19 (*)    Acid-base deficit 5.0 (*)    Potassium <2.0 (*)     Calcium, Ion 1.14 (*)    HCT 25.0 (*)    Hemoglobin 8.5 (*)    All other components within normal limits  TROPONIN I (HIGH SENSITIVITY) - Abnormal; Notable for the following components:   Troponin I (High Sensitivity) 188 (*)    All other components within normal limits  RESP PANEL BY RT-PCR (FLU A&B, COVID) ARPGX2  HEPARIN LEVEL (UNFRACTIONATED)  CBC  TROPONIN I (HIGH SENSITIVITY)    EKG EKG Interpretation  Date/Time:  Thursday September 10 2021 14:27:46 EST Ventricular Rate:  70 PR Interval:  140 QRS Duration: 98 QT Interval:  408 QTC Calculation: 441 R Axis:   31 Text Interpretation: Sinus or ectopic atrial rhythm Probable LVH with secondary repol abnrm No significant change since last tracing Confirmed by Blanchie Dessert [phone]) on 09/10/2021 4:31:33 PM  Radiology DG Chest Portable 1 View  Result Date: 09/10/2021 CLINICAL DATA:  Chest pain and shortness of breath. EXAM: PORTABLE CHEST 1 VIEW COMPARISON:  July 25, 2021 FINDINGS: The heart size and mediastinal contours are stable. The aorta is tortuous. Both lungs are clear. The visualized skeletal structures are stable. IMPRESSION: No active disease. Electronically Signed   By: Abelardo Diesel M.D.   On: 09/10/2021 15:31     Procedures .Critical Care Performed by: Emeline Darling, PA-C Authorized by: Emeline Darling, PA-C   Critical care provider statement:    Critical care time (minutes):  45   Critical care was necessary to treat or prevent imminent or life-threatening deterioration of the following conditions:  Respiratory failure   Critical care was time spent personally by me on the following activities:  Development of treatment plan with patient or surrogate, discussions with consultants, evaluation of patient's response to treatment, examination of patient, obtaining history from patient or surrogate, ordering and performing treatments and interventions, ordering and review of laboratory studies,  ordering and review of radiographic studies, pulse oximetry and re-evaluation of patient's condition   Medications Ordered in ED Medications  potassium chloride 10 mEq in 100 mL IVPB (10 mEq Intravenous New Bag/Given 09/10/21 1705)  heparin bolus via infusion 3,000 Units (has no administration in time range)  heparin ADULT infusion 100 units/mL (25000 units/229mL) (has no administration in time range)  nitroGLYCERIN (NITROSTAT) SL tablet 0.4 mg (0.4 mg Sublingual Given 09/10/21 1501)  ipratropium-albuterol (DUONEB) 0.5-2.5 (3) MG/3ML nebulizer solution 3 mL (3 mLs Nebulization Given 09/10/21 1503)  labetalol (NORMODYNE) injection 10 mg (10 mg Intravenous Given 09/10/21 1657)   ED Course  I have reviewed the triage vital signs and the nursing notes.  Pertinent labs & imaging results that were available during my care of the patient were reviewed by me and considered in my medical decision making (see chart for details).  Clinical Course as of 09/10/21 1726  Thu Sep 10, 2021  1544 Patient reevaluted without improvement in her SOB after duoneb.  [RS]  1702 Consult call to hospitalist, Dr. Lorin Mercy, who is agreeable seeing this patient and admitting her to her service.  Appreciate her collaboration in the care of this patient. [RS]    Clinical Course User Index [RS] Cap Massi, Gypsy Balsam, PA-C   MDM Rules/Calculators/A&P                         80 year old female who presents with concern for chest pain and shortness of breath that started yesterday.  Differential diagnosis includes limited to ACS, PE, pleural effusion, pneumothorax, pneumonia, CHF, hypertensive urgency/emergency, dysrhythmia.  Patient was transiently hypoxic requiring at least 2 L by nasal cannula to maintain her sats above 90%.  Becomes significantly tachypneic and short of breath with talking.  Patient is notably hypertensive with BP of 109/105 on intake.  Transiently BP decreased with systolic in the 453M after  administration of nitroglycerin sublingually,  however bounced back up to 161 systolic.  Cardiac exam is unremarkable, pulmonary exam overall clear with some rhonchorous sounds in the right lung base but without wheezing or significant rales.  Abdominal exam with lidocaine patches but soft, nondistended, and nontender.  Patient does not clinically appear fluid overloaded and is neurovascularly intact in all 4 extremities.  Walks with a walker at baseline.  CBC with mild leukocytosis of 11.1, hemoglobin near baseline at 9.5.  CMP with hypokalemia of 2.3, creatinine of 2.7 near patient's baseline.  Anion gap of 22.  Troponin elevated to 188.  ABG was obtained with normal pH, mildly low bicarb 18, normal PO2.  BNP 1006, the patient does not appear overloaded. EKG without STEMI.  Labetalol given for hypertension, concern for PE, however cannot CT angiogram this patient due to her kidney function.  VQ scan ordered, case discussed with attending physician, Dr. Maryan Rued, as patient has history of questionable traumatic subarachnoid hemorrhage 2 years ago.  Given concerning presentation today, as well as interval anticoagulation following subarachnoid bleed, will proceed with heparin per pharmacy.  Differential diagnosis remains PE versus hypertensive emergency. Favor PE diagnosis, given hypoxia without pulmonary edema on CXR. [place] voiced understanding of her medical evaluation and treatment plan.  To her questions answered to her expressed inspection.  She is amenable to plan for admission at this time.  Consult to hospitalist as above.  I appreciate her collaboration of care with patient.  This chart was dictated using voice recognition software, Dragon. Despite the best efforts of this provider to proofread and correct errors, errors may still occur which can change documentation meaning.  Final Clinical Impression(s) / ED Diagnoses Final diagnoses:  None    Rx / DC Orders ED Discharge Orders      None        Emeline Darling, PA-C 09/10/21 1726    Lucrezia Starch, MD 09/11/21 [phone]

## 2021-09-10 NOTE — Progress Notes (Addendum)
ANTICOAGULATION CONSULT NOTE - Initial Consult  Pharmacy Consult for heparin Indication: pulmonary embolus rule out - pending NM scan (unable to do CTA 2/2 Cr elevation)  No Known Allergies  Patient Measurements: Height: 5\' 3"  (160 cm) Weight: 43.1 kg (95 lb 0.3 oz) IBW/kg (Calculated) : 52.4 Heparin Dosing Weight: 43.1 kg  Vital Signs: Temp: 97.8 F (36.6 C) (11/24 1430) Temp Source: Oral (11/24 1430) BP: 205/108 (11/24 1600) Pulse Rate: 65 (11/24 1600)  Labs: Recent Labs    09/10/21 1446 09/10/21 1535  HGB 9.5* 8.5*  HCT 29.3* 25.0*  PLT 662*  --   CREATININE 2.78*  --   TROPONINIHS 188*  --     Estimated Creatinine Clearance: 11.3 mL/min (A) (by C-G formula based on SCr of 2.78 mg/dL (H)).   Medical History: Past Medical History:  Diagnosis Date   Acute respiratory failure with hypoxia ([place]) 11/01/2019   Anxiety    Arthritis    Chest pain    Chronic kidney disease    Dyspnea    Elevated troponin 12/13/2018   Headache(784.0)    Hyperlipidemia    Hypertension    Joint pain    Leg pain    Peripheral neuropathy    Peripheral vascular disease ([place])    Syncope 09/2020    Medications:  see MAR  Assessment: 80 yo F with SOB and CP x 2 days - tachypneic to 30s with O2 sat to 60s on RA . On 2L [place] in ED. Possible PE - no AC PTA. CBC ok.   Goal of Therapy:  Heparin level 0.3-0.7 units/ml Monitor platelets by anticoagulation protocol: Yes   Plan: F/u NM scan Give 3000 units bolus x 1 Start heparin infusion at 750 units/hr Check anti-Xa level in 8 hours and daily while on heparin Continue to monitor H&H and platelets  Joetta Manners, PharmD, [facility] Emergency Medicine Clinical Pharmacist ED RPh Phone: [place]: [phone]

## 2021-09-10 NOTE — ED Triage Notes (Signed)
Pt started answering questions at this time. O2 dropped to 80% on RA. O2 at 2LPM maintained at this time.

## 2021-09-10 NOTE — Progress Notes (Signed)
MD Chotiner notified regarding Potassium of 2 from 1535 lab result. Stat orders received, called phlebotomy for stat labs, will updated MD once results are ready.

## 2021-09-10 NOTE — ED Triage Notes (Signed)
Hx of COPD and emphysema. Here for shortness of breath and chest pain x 2 days. Tachypneic at 28-35 cpm on the monitor. O2 at 100% on RA.

## 2021-09-11 ENCOUNTER — Encounter (HOSPITAL_COMMUNITY): Payer: Self-pay | Admitting: Internal Medicine

## 2021-09-11 ENCOUNTER — Other Ambulatory Visit: Payer: Self-pay

## 2021-09-11 DIAGNOSIS — R778 Other specified abnormalities of plasma proteins: Secondary | ICD-10-CM | POA: Diagnosis not present

## 2021-09-11 DIAGNOSIS — I169 Hypertensive crisis, unspecified: Secondary | ICD-10-CM | POA: Diagnosis not present

## 2021-09-11 DIAGNOSIS — J449 Chronic obstructive pulmonary disease, unspecified: Secondary | ICD-10-CM | POA: Diagnosis not present

## 2021-09-11 DIAGNOSIS — E440 Moderate protein-calorie malnutrition: Secondary | ICD-10-CM

## 2021-09-11 DIAGNOSIS — I10 Essential (primary) hypertension: Secondary | ICD-10-CM

## 2021-09-11 DIAGNOSIS — Z66 Do not resuscitate: Secondary | ICD-10-CM

## 2021-09-11 DIAGNOSIS — N184 Chronic kidney disease, stage 4 (severe): Secondary | ICD-10-CM | POA: Diagnosis not present

## 2021-09-11 DIAGNOSIS — R0602 Shortness of breath: Secondary | ICD-10-CM | POA: Diagnosis not present

## 2021-09-11 LAB — COMPREHENSIVE METABOLIC PANEL
ALT: 32 U/L (ref 0–44)
AST: 47 U/L — ABNORMAL HIGH (ref 15–41)
Albumin: 3.1 g/dL — ABNORMAL LOW (ref 3.5–5.0)
Alkaline Phosphatase: 111 U/L (ref 38–126)
Anion gap: 22 — ABNORMAL HIGH (ref 5–15)
BUN: 35 mg/dL — ABNORMAL HIGH (ref 8–23)
CO2: 18 mmol/L — ABNORMAL LOW (ref 22–32)
Calcium: 8.9 mg/dL (ref 8.9–10.3)
Chloride: 99 mmol/L (ref 98–111)
Creatinine, Ser: 2.78 mg/dL — ABNORMAL HIGH (ref 0.44–1.00)
GFR, Estimated: 17 mL/min — ABNORMAL LOW (ref >60)
Glucose, Bld: 72 mg/dL (ref 70–99)
Potassium: 2.3 mmol/L — CL (ref 3.5–5.1)
Sodium: 139 mmol/L (ref 135–145)
Total Bilirubin: 1.2 mg/dL (ref 0.3–1.2)
Total Protein: 6.8 g/dL (ref 6.5–8.1)

## 2021-09-11 LAB — BASIC METABOLIC PANEL
Anion gap: 10 (ref 5–15)
BUN: 40 mg/dL — ABNORMAL HIGH (ref 8–23)
CO2: 24 mmol/L (ref 22–32)
Calcium: 7.8 mg/dL — ABNORMAL LOW (ref 8.9–10.3)
Chloride: 100 mmol/L (ref 98–111)
Creatinine, Ser: 2.53 mg/dL — ABNORMAL HIGH (ref 0.44–1.00)
GFR, Estimated: 19 mL/min — ABNORMAL LOW (ref >60)
Glucose, Bld: 72 mg/dL (ref 70–99)
Potassium: 3.7 mmol/L (ref 3.5–5.1)
Sodium: 134 mmol/L — ABNORMAL LOW (ref 135–145)

## 2021-09-11 LAB — CBC
HCT: 23.4 % — ABNORMAL LOW (ref 36.0–46.0)
Hemoglobin: 7.4 g/dL — ABNORMAL LOW (ref 12.0–15.0)
MCH: 27.2 pg (ref 26.0–34.0)
MCHC: 31.6 g/dL (ref 30.0–36.0)
MCV: 86 fL (ref 80.0–100.0)
Platelets: 509 10*3/uL — ABNORMAL HIGH (ref 150–400)
RBC: 2.72 MIL/uL — ABNORMAL LOW (ref 3.87–5.11)
RDW: 18.4 % — ABNORMAL HIGH (ref 11.5–15.5)
WBC: 8.3 10*3/uL (ref 4.0–10.5)
nRBC: 0 % (ref 0.0–0.2)

## 2021-09-11 LAB — HEPARIN LEVEL (UNFRACTIONATED)
Heparin Unfractionated: 0.64 IU/mL (ref 0.30–0.70)
Heparin Unfractionated: 0.86 IU/mL — ABNORMAL HIGH (ref 0.30–0.70)
Heparin Unfractionated: <0.1 IU/mL — ABNORMAL LOW (ref 0.30–0.70)

## 2021-09-11 LAB — PHOSPHORUS: Phosphorus: 2.8 mg/dL (ref 2.5–4.6)

## 2021-09-11 LAB — MRSA NEXT GEN BY PCR, NASAL: MRSA by PCR Next Gen: NOT DETECTED

## 2021-09-11 LAB — MAGNESIUM: Magnesium: 2.7 mg/dL — ABNORMAL HIGH (ref 1.7–2.4)

## 2021-09-11 LAB — POTASSIUM: Potassium: 2.1 mmol/L — CL (ref 3.5–5.1)

## 2021-09-11 MED ORDER — NEPRO/CARBSTEADY PO LIQD
237.0000 mL | Freq: Two times a day (BID) | ORAL | Status: DC
  Administered 2021-09-11 – 2021-09-16 (×10): 237 mL via ORAL

## 2021-09-11 MED ORDER — CLONIDINE HCL 0.1 MG PO TABS
0.1000 mg | ORAL_TABLET | Freq: Two times a day (BID) | ORAL | Status: DC
  Administered 2021-09-11: 0.1 mg via ORAL
  Filled 2021-09-11: qty 1

## 2021-09-11 MED ORDER — POTASSIUM CHLORIDE 10 MEQ/100ML IV SOLN
10.0000 meq | INTRAVENOUS | Status: AC
  Administered 2021-09-11 (×6): 10 meq via INTRAVENOUS
  Filled 2021-09-11 (×6): qty 100

## 2021-09-11 MED ORDER — NEPRO/CARBSTEADY PO LIQD: 237.0000 mL | Freq: Two times a day (BID) | ORAL | Status: DC

## 2021-09-11 MED ORDER — RENA-VITE PO TABS
1.0000 | ORAL_TABLET | Freq: Every day | ORAL | Status: DC
  Administered 2021-09-11 – 2021-09-16 (×6): 1 via ORAL
  Filled 2021-09-11 (×6): qty 1

## 2021-09-11 NOTE — Progress Notes (Signed)
ANTICOAGULATION CONSULT NOTE  Pharmacy Consult for heparin Indication: r/o ACS  No Known Allergies  Patient Measurements: Height: 5\' 3"  (160 cm) Weight: 43.1 kg (95 lb 0.3 oz) IBW/kg (Calculated) : 52.4 Heparin Dosing Weight: 43.1 kg  Vital Signs: Temp: 98.5 F (36.9 C) (11/25 0700) Temp Source: Oral (11/25 0700) BP: 100/60 (11/25 1102) Pulse Rate: 64 (11/25 1007)  Labs: Recent Labs    09/10/21 1446 09/10/21 1535 09/10/21 1635 09/11/21 0104 09/11/21 0651 09/11/21 0914  HGB 9.5* 8.5*  --  7.4*  --   --   HCT 29.3* 25.0*  --  23.4*  --   --   PLT 662*  --   --  509*  --   --   HEPARINUNFRC  --   --   --  0.64  --  0.86*  CREATININE 2.78*  --   --   --  2.53*  --   TROPONINIHS 188*  --  169*  --   --   --      Estimated Creatinine Clearance: 12.5 mL/min (A) (by C-G formula based on SCr of 2.53 mg/dL (H)).  Assessment: 80 yo F with SOB and CP x 2 days - tachypneic to 30s with O2 sat to 60s on RA . On 2L [place] in ED. Heparin started for r/o PE and r/o ACS - no AC PTA. VQ scan negative for PE. Continuing heparin for r/o ACS. Hgb down to 7.4, plt elevated.   Heparin level slightly supratherapeutic on gtt at 750 units/hr. No bleeding noted.  Goal of Therapy:  Heparin level 0.3-0.7 units/ml Monitor platelets by anticoagulation protocol: Yes   Plan:  Reduce IV heparin to 650 units/hr. Will f/u 8 hr confirmatory heparin level F/u Hgb closely and f/u for further cardiac work-up  Marguerite Olea, [facility] Clinical Pharmacist  09/11/2021 11:24 AM   [facility] pharmacy phone numbers are listed on [email or web address]

## 2021-09-11 NOTE — Progress Notes (Signed)
ANTICOAGULATION CONSULT NOTE  Pharmacy Consult for heparin Indication: r/o ACS  No Known Allergies  Patient Measurements: Height: 5\' 3"  (160 cm) Weight: 43.1 kg (95 lb 0.3 oz) IBW/kg (Calculated) : 52.4 Heparin Dosing Weight: 43.1 kg  Vital Signs: Temp: 97.6 F (36.4 C) (11/24 2238) Temp Source: Oral (11/24 2238) BP: 109/61 (11/24 2300) Pulse Rate: 66 (11/24 2300)  Labs: Recent Labs    09/10/21 1446 09/10/21 1535 09/10/21 1635 09/11/21 0104  HGB 9.5* 8.5*  --  7.4*  HCT 29.3* 25.0*  --  23.4*  PLT 662*  --   --  509*  HEPARINUNFRC  --   --   --  0.64  CREATININE 2.78*  --   --   --   TROPONINIHS 188*  --  169*  --      Estimated Creatinine Clearance: 11.3 mL/min (A) (by C-G formula based on SCr of 2.78 mg/dL (H)).  Assessment: 80 yo F with SOB and CP x 2 days - tachypneic to 30s with O2 sat to 60s on RA . On 2L [place] in ED. Heparin started for r/o PE and r/o ACS - no AC PTA. VQ scan negative for PE. Continuing heparin for r/o ACS. Hgb down to 7.4, plt elevated.   Heparin level therapeutic (0.64) on gtt at 750 units/hr. No bleeding noted.  Goal of Therapy:  Heparin level 0.3-0.7 units/ml Monitor platelets by anticoagulation protocol: Yes   Plan:  Continue heparin 750 units/hr Will f/u 6 hr confirmatory heparin level F/u Hgb closely and f/u for further cardiac work-up  Sherlon Handing, PharmD, BCPS Please see amion for complete clinical pharmacist phone list 09/11/2021 2:52 AM

## 2021-09-11 NOTE — Plan of Care (Signed)
  Problem: Clinical Measurements: Goal: Respiratory complications will improve Outcome: Progressing   

## 2021-09-11 NOTE — Consult Note (Addendum)
Cardiology Consultation:   Patient ID: Alyssa [name] MRN: [id number]; DOB: 07/27/1941  Admit date: 09/10/2021 Date of Consult: 09/11/2021  PCP:  System, Provider Not In   [facility] Providers Cardiologist:  Shelva Majestic, MD        Patient Profile:   Alyssa [name] is a 80 y.o. female with a hx of HTN, HLD, PAD w/ carotid dz and s/p L>R  Fem-fem 2011, CKD IV, elevated trop, periph neuropathy, dementia, 3 week admit 06/2021 for acute  respiratory failure associated with LLL collapse with COPD and active herpes zoster infection, abnl ECG, who is being seen 09/11/2021 for the evaluation of elevated troponin, HTN, at the request of Dr Darrick Meigs.  History of Present Illness:   Alyssa [name] was seen by Dr. Claiborne Billings 03/2020 for cardiology evaluation.  She has a history of nonobstructive carotid disease and dyspnea on exertion.  When she was admitted 8/20 9-06/2022, she was complaining of shortness of breath, pain and rash.  According to her, she has had chest pain since that time.  It happens when she is walking, but resolves with rest.  She does not think it gets worse with deep inspiration.  She came in this time for shortness of breath as well as abdominal pain.  Abdomen is tender in the left upper quadrant.  She is feeling much better, and is not having chest pain at this time.  She got a sublingual nitroglycerin after admission, but does not remember if this made a difference in her pain.  She does not know why her blood pressure was so high on admission.  Her potassium was very low on admission at 2.3 and dropped to less than 2.0.  It has been supplemented.  She was not on a diuretic or potassium supplementation prior to admission.  She was on a magnesium supplement prior to admission her and her magnesium level was 2.7.  Her hemoglobin was 9.5 on admission but has dropped to 7.4.  Platelets are elevated at 662.  She has had troponin elevations before, but 188 is higher than  she has had previously.  Her BNP is also elevated at 1,006.  This is higher than previous values.    Past Medical History:  Diagnosis Date   Acute respiratory failure with hypoxia ([place]) 11/01/2019   Anxiety    Arthritis    Chest pain    Chronic kidney disease    Dyspnea    Elevated troponin 12/13/2018   Headache(784.0)    Hyperlipidemia    Hypertension    Joint pain    Leg pain    Peripheral neuropathy    Peripheral vascular disease ([place])    Syncope 09/2020    Past Surgical History:  Procedure Laterality Date   ABDOMINAL ANGIOGRAM N/A 10/29/2011   Procedure: ABDOMINAL ANGIOGRAM;  Surgeon: Elam Dutch, MD;  Location: [facility] CATH LAB;  Service: Cardiovascular;  Laterality: N/A;   BACK SURGERY     COLONOSCOPY     ESOPHAGOGASTRODUODENOSCOPY     FEMORAL-FEMORAL BYPASS GRAFT  08/31/2010   FLEXIBLE SIGMOIDOSCOPY N/A 06/12/2019   Procedure: FLEXIBLE SIGMOIDOSCOPY;  Surgeon: Ladene Artist, MD;  Location: [facility];  Service: Endoscopy;  Laterality: N/A;  Patient had little bit to eat this morning so this will be unsedated.   JOINT REPLACEMENT Right 10/18/2010   Hip   LOWER EXTREMITY ANGIOGRAPHY  06/14/2017   Procedure: Lower Extremity Angiography;  Surgeon: Adrian Prows, MD;  Location: [place] CV LAB;  Service: Cardiovascular;;  Bilateral  limited angio performed   RENAL ANGIOGRAPHY N/A 06/14/2017   Procedure: Renal Angiography;  Surgeon: Adrian Prows, MD;  Location: [place] CV LAB;  Service: Cardiovascular;  Laterality: N/A;   TOTAL HIP ARTHROPLASTY     right     Home Medications:  Prior to Admission medications   Medication Sig Start Date End Date Taking? Authorizing Provider  acetaminophen (TYLENOL) 650 MG CR tablet Take 650 mg by mouth daily.   Yes [provider]  amitriptyline (ELAVIL) 10 MG tablet Take 10 mg by mouth daily.   Yes [provider]  aspirin 81 MG EC tablet Take 1 tablet (81 mg total) by mouth daily. 01/09/19  Yes Gherghe, Vella Redhead, MD  Budeson-Glycopyrrol-Formoterol (BREZTRI AEROSPHERE) 160-9-4.8 MCG/ACT AERO Inhale 2 puffs into the lungs 2 (two) times daily. 07/23/21  Yes Tanda Rockers, MD  clopidogrel (PLAVIX) 75 MG tablet Take 1 tablet (75 mg total) by mouth daily. 01/09/19  Yes Caren Griffins, MD  diclofenac Sodium (VOLTAREN) 1 % GEL Apply 2 g topically every 6 (six) hours as needed (knee pain). 01/15/20  Yes [provider]  dicyclomine (BENTYL) 20 MG tablet Take 20 mg by mouth 3 (three) times daily. 04/01/20  Yes [provider]  docusate sodium (COLACE) 100 MG capsule Take 1 capsule (100 mg total) by mouth daily. 07/11/21  Yes Antonieta Pert, MD  Ensure Plus (ENSURE PLUS) LIQD Take 237 mLs by mouth daily.   Yes [provider]  Ferrous Sulfate (IRON HIGH-POTENCY) 325 MG TABS Take 325 mg by mouth daily with breakfast. Patient taking differently: Take 325 mg by mouth daily. 06/30/19  Yes Hongalgi, Lenis Dickinson, MD  lidocaine (LIDODERM) 5 % Place 1 patch onto the skin daily. 08/31/21  Yes [provider]  magnesium oxide (MAG-OX) 400 MG tablet Take 400 mg by mouth 2 (two) times daily.   Yes [provider]  megestrol (MEGACE) 40 MG tablet Take 40 mg by mouth 2 (two) times daily. 05/12/20  Yes [provider]  methocarbamol (ROBAXIN) 500 MG tablet Take 500 mg by mouth 3 (three) times daily.   Yes [provider]  Multiple Vitamins-Minerals (HAIR/SKIN/NAILS) TABS Take 1 tablet by mouth daily.   Yes [provider]  nitroGLYCERIN (NITROSTAT) 0.4 MG SL tablet Place 1 tablet (0.4 mg total) under the tongue every 5 (five) minutes as needed for chest pain. 01/07/20  Yes Allie Bossier, MD  pregabalin (LYRICA) 75 MG capsule Take 1 capsule (75 mg total) by mouth daily. 07/11/21 11/17/21 Yes Antonieta Pert, MD  sodium bicarbonate 650 MG tablet Take 2 tablets (1,300 mg total) by mouth 2 (two) times daily. 01/07/20  Yes Allie Bossier, MD  valproic acid (DEPAKENE) 250 MG  capsule Take 250 mg by mouth at bedtime.   Yes [provider]  albuterol (VENTOLIN HFA) 108 (90 Base) MCG/ACT inhaler Inhale 2 puffs into the lungs every 4 (four) hours as needed for wheezing or shortness of breath.  Patient not taking: Reported on 09/10/2021    [provider]  amLODipine (NORVASC) 10 MG tablet Take 1 tablet (10 mg total) by mouth at bedtime. Patient not taking: Reported on 09/10/2021 07/10/21   Antonieta Pert, MD  carvedilol (COREG) 12.5 MG tablet Take 12.5 mg by mouth 2 (two) times daily. Patient not taking: Reported on 09/10/2021 09/05/20   [provider]  cloNIDine (CATAPRES) 0.2 MG tablet Take 0.2 mg by mouth at bedtime. Patient not taking: Reported on 09/10/2021 05/25/20  [provider]  donepezil (ARICEPT) 5 MG tablet Take 1 tablet (5 mg total) by mouth daily. Patient not taking: Reported on 09/10/2021 11/20/20   Cameron Sprang, MD  DULoxetine 40 MG CPEP Take 40 mg by mouth daily. Patient not taking: Reported on 09/10/2021 07/11/21   Antonieta Pert, MD  gabapentin (NEURONTIN) 100 MG capsule Take 200 mg by mouth 2 (two) times daily. Patient not taking: Reported on 09/10/2021 07/23/21   [provider]  hydrOXYzine (ATARAX/VISTARIL) 25 MG tablet Take 25 mg by mouth at bedtime. Patient not taking: Reported on 09/10/2021 05/12/20   [provider]  ondansetron (ZOFRAN ODT) 4 MG disintegrating tablet Take 1 tablet (4 mg total) by mouth every 4 (four) hours as needed for nausea or vomiting. Patient not taking: Reported on 09/10/2021 06/09/20   Charlesetta Shanks, MD  pantoprazole (PROTONIX) 40 MG tablet Take 1 tablet (40 mg total) by mouth daily. Patient not taking: Reported on 09/10/2021 06/16/19   Modena Jansky, MD    Inpatient Medications: Scheduled Meds:  amLODipine  10 mg Oral q morning   aspirin EC  81 mg Oral Daily   carvedilol  12.5 mg Oral BID   cloNIDine  0.2 mg Oral BID   clopidogrel  75 mg Oral Daily   dicyclomine   20 mg Oral TID   docusate sodium  100 mg Oral BID   feeding supplement (NEPRO CARB STEADY)  237 mL Oral BID BM   fluticasone furoate-vilanterol  1 puff Inhalation Daily   And   umeclidinium bromide  1 puff Inhalation Daily   gabapentin  200 mg Oral BID   hydrOXYzine  25 mg Oral QHS   megestrol  40 mg Oral BID   pantoprazole  40 mg Oral Daily   sodium bicarbonate  1,300 mg Oral BID   sodium chloride flush  3 mL Intravenous Q12H   Continuous Infusions:  heparin 650 Units/hr (09/11/21 1125)   PRN Meds: acetaminophen **OR** acetaminophen, albuterol, calcium carbonate (dosed in mg elemental calcium), camphor-menthol **AND** hydrOXYzine, docusate sodium, feeding supplement (NEPRO CARB STEADY), hydrALAZINE, ondansetron **OR** ondansetron (ZOFRAN) IV, oxyCODONE, sorbitol, zolpidem  Allergies:   No Known Allergies  Social History:   Social History   Socioeconomic History   Marital status: Single    Spouse name: Not on file   Number of children: 2   Years of education: 11th   Highest education level: Not on file  Occupational History   Occupation: Retired  Tobacco Use   Smoking status: Former    Packs/day: 1.00    Years: 50.00    Pack years: 50.00    Types: Cigarettes    Quit date: 04/22/2021    Years since quitting: 0.3   Smokeless tobacco: Never   Tobacco comments:    Quit 3 months ago 07/23/21 [facility]  Vaping Use   Vaping Use: Never used  Substance and Sexual Activity   Alcohol use: No   Drug use: No   Sexual activity: Not Currently  Other Topics Concern   Not on file  Social History Narrative    Lives at home with her brother.   Right-handed.   Occasional caffeine use.   Social Determinants of Health   Financial Resource Strain: Not on file  Food Insecurity: Not on file  Transportation Needs: Not on file  Physical Activity: Not on file  Stress: Not on file  Social Connections: Not on file  Intimate Partner Violence: Not on file    Family History:  Family History   Problem Relation Age of Onset   Heart attack Mother    Heart disease Mother    Hyperlipidemia Mother    Hypertension Mother    Heart attack Father    Heart disease Father        Before age 78   Hyperlipidemia Father    Hypertension Father    Cancer Sister        brain tumor   Aneurysm Brother        brain   Colon cancer Neg Hx    Liver cancer Neg Hx      ROS:  Please see the history of present illness.  All other ROS reviewed and negative.     Physical Exam/Data:   Vitals:   09/11/21 0700 09/11/21 1007 09/11/21 1100 09/11/21 1102  BP: (!) 145/92  100/60 100/60  Pulse: 74 64 65   Resp: 17  17   Temp: 98.5 F (36.9 C)  98.6 F (37 C)   TempSrc: Oral  Oral   SpO2: 100%  100%   Weight:      Height:        Intake/Output Summary (Last 24 hours) at 09/11/2021 1145 Last data filed at 09/11/2021 0610 Gross per 24 hour  Intake 1297.87 ml  Output --  Net 1297.87 ml   Last 3 Weights 09/10/2021 08/07/2021 07/23/2021  Weight (lbs) 95 lb 0.3 oz 95 lb 97 lb  Weight (kg) 43.1 kg 43.092 kg 43.999 kg     Body mass index is 16.83 kg/m.  General:  Well developed, frail, elderly female, in no acute distress HEENT: normal Neck: JVD 10 cm Vascular: + carotid bruits; Distal pulses 1-2+ bilaterally Cardiac:  normal S1, S2; RRR; no murmur  Lungs:  clear to auscultation bilaterally, no wheezing, rhonchi or rales  Abd: soft, tender, LUQ is worst, no hepatomegaly  Ext: no edema Musculoskeletal:  No deformities, BUE and BLE strength weak but equal Skin: warm and dry  Neuro:  CNs 2-12 intact, no focal abnormalities noted Psych:  Normal affect   EKG:  The EKG was personally reviewed and demonstrates:  SR, HR 74, ?abnl lead placement (different from 10/21 ECG) Telemetry:  Telemetry was personally reviewed and demonstrates:  SR  Relevant CV Studies:  Outpatient echocardiogram 05/29/2020  1. Left ventricular ejection fraction, by estimation, is 55 to 60%. The  left ventricle has  normal function. The left ventricle has no regional  wall motion abnormalities. There is severe concentric left ventricular  hypertrophy. Left ventricular diastolic   parameters are indeterminate. Elevated left ventricular end-diastolic  pressure.   2. Right ventricular systolic function is normal. The right ventricular  size is normal. Tricuspid regurgitation signal is inadequate for assessing  PA pressure.   3. The mitral valve is grossly normal. Trivial mitral valve  regurgitation. No evidence of mitral stenosis.   4. The aortic valve is tricuspid. Aortic valve regurgitation is not  visualized. No aortic stenosis is present.   5. Aortic dilatation noted. There is borderline dilatation of the  ascending aorta measuring 38 mm.   Comparison(s): 01/03/20 EF 60-65%.    Outpatient echocardiogram 11/30/2017: Left ventricle is normal in size, moderate LVH, normal LV systolic function, diastolic function could not be assessed due to his/effusion in the setting of first-degree AV block.  EF estimated at 55%.  Mild MR and mild TR, PA pressure 40 mmHg.  Consistent with mild-to-moderate pulmonary hypertension.   Lexiscan Myoview stress test 03/18/2017:  EKG demonstrates LVH  with repolarization abnormality.  Stress EKG was nondiagnostic.  Symptoms included dyspnea. Perfusion imaging study was normal without evidence of ischemia, EF 64%, low risk study.   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  Left femorofemoral bypass graft evident.    Laboratory Data:  High Sensitivity Troponin:   Recent Labs  Lab 09/10/21 1446 09/10/21 1635  TROPONINIHS 188* 169*     Chemistry Recent Labs  Lab 09/10/21 1446 09/10/21 1535 09/10/21 2325 09/11/21 0651  NA 139 139  --  134*  K 2.3* <2.0* 2.1* 3.7  CL 99  --   --  100  CO2 18*  --   --  24  GLUCOSE 72  --   --  72  BUN 35*  --   --  40*  CREATININE 2.78*  --   --  2.53*  CALCIUM 8.9  --   --   7.8*  MG  --   --  2.7*  --   GFRNONAA 17*  --   --  19*  ANIONGAP 22*  --   --  10    Recent Labs  Lab 09/10/21 1446  PROT 6.8  ALBUMIN 3.1*  AST 47*  ALT 32  ALKPHOS 111  BILITOT 1.2   Lipids No results for input(s): CHOL, TRIG, HDL, LABVLDL, LDLCALC, CHOLHDL in the last 168 hours.  Hematology Recent Labs  Lab 09/10/21 1446 09/10/21 1535 09/11/21 0104  WBC 11.1*  --  8.3  RBC 3.46*  --  2.72*  HGB 9.5* 8.5* 7.4*  HCT 29.3* 25.0* 23.4*  MCV 84.7  --  86.0  MCH 27.5  --  27.2  MCHC 32.4  --  31.6  RDW 18.1*  --  18.4*  PLT 662*  --  509*   Thyroid No results for input(s): TSH, FREET4 in the last 168 hours.  BNP Recent Labs  Lab 09/10/21 1446  BNP 1,006.5*    DDimer No results for input(s): DDIMER in the last 168 hours. Lab Results  Component Value Date   TRIG 234 (H) 06/09/2020     Radiology/Studies:  NM Pulmonary Perfusion  Result Date: 09/10/2021 CLINICAL DATA:  COPD, dementia, short of breath and left-sided chest discomfort EXAM: NUCLEAR MEDICINE PERFUSION LUNG SCAN TECHNIQUE: Perfusion images were obtained in multiple projections after intravenous injection of radiopharmaceutical. Ventilation scans intentionally deferred if perfusion scan and chest x-ray adequate for interpretation during COVID 19 epidemic. RADIOPHARMACEUTICALS:  3.8 mCi Tc-7m MAA IV COMPARISON:  09/10/2021 FINDINGS: Planar images of the chest were obtained in multiple projections during the perfusion examination. Normal distribution of radiotracer is seen throughout the lungs. There are no wedge-shaped perfusion defects to suggest pulmonary embolus. IMPRESSION: 1. No evidence of pulmonary embolus. Electronically Signed   By: Randa Ngo M.D.   On: 09/10/2021 19:55   DG Chest Portable 1 View  Result Date: 09/10/2021 CLINICAL DATA:  Chest pain and shortness of breath. EXAM: PORTABLE CHEST 1 VIEW COMPARISON:  July 25, 2021 FINDINGS: The heart size and mediastinal contours are stable. The  aorta is tortuous. Both lungs are clear. The visualized skeletal structures are stable. IMPRESSION: No active disease. Electronically Signed   By: Abelardo Diesel M.D.   On: 09/10/2021 15:31     Assessment and Plan:   Elevated troponin, chest pain - Peak troponin 188 and trended down from there - This was in the setting of hypertensive urgency, significantly low potassium level and anemia -She has complained of  chest pain in the past, and was treated medically because of her poor renal function -Her last echo was over a year ago, will repeat. -Do not feel she is a cath candidate, but the echo will help guide medical therapy. -Suspect troponin elevation is related to demand ischemia from her elevated blood pressure and hypoxia on admission -Prior to admission she was on multiple blood pressure medications, but was not on Imdur or other long-acting nitrate. -Currently, her blood pressure is 100/60 so will not add anything - Continue ASA 81 mg, Plavix 75 mg, carvedilol 12.5 mg twice daily, amlodipine 10 mg daily  2. hypoxia: - Her chest x-ray did not show significant abnormalities. - VQ neg PE - f/u on echo   Risk Assessment/Risk Scores:     HEAR Score (for undifferentiated chest pain):  HEAR Score: 5   For questions or updates, please contact [place] Please consult [email or web address] for contact info under    Signed, Rosaria Ferries, PA-C  09/11/2021 11:45 AM  I have personally seen and examined this patient. I agree with the assessment and plan as outlined above.  80 yo frail elderly female with dementia, HTN, HLD, severe COPD, PAD, stage 4 CKD admitted with worsened dyspnea. We are consulted for a mildly elevated troponin (188) in the setting of hypertensive urgency. Also K extremely low.  She has no chest pain or dyspnea today.  Labs reviewed. EKG reviewed by me and shows sinus with LVH Exam: As above. Frail elderly female. Pleasant. NAD. CV:RRR. Lungs: clear bilaterally. Ext:  no LE edema  Plan: Her elevated troponin is likely due to demand ischemia in the setting of hypertensive urgency. Her BP is now well controlled. While she may have underlying CAD, she is not a candidate for cardiac cath (dementia, advanced age, CKD 4) so would not proceed with any further ischemic workup. Echo to assess LV function.  No other recommendations.   Lauree Chandler 09/11/2021 12:37 PM

## 2021-09-11 NOTE — Plan of Care (Signed)
  Problem: Education: Goal: Knowledge of General Education information will improve Description: Including pain rating scale, medication(s)/side effects and non-pharmacologic comfort measures 09/11/2021 2039 by Shanon Ace, RN Outcome: Progressing 09/11/2021 2038 by Shanon Ace, RN Outcome: Progressing   Problem: Health Behavior/Discharge Planning: Goal: Ability to manage health-related needs will improve 09/11/2021 2039 by Shanon Ace, RN Outcome: Progressing 09/11/2021 2038 by Shanon Ace, RN Outcome: Progressing   Problem: Clinical Measurements: Goal: Ability to maintain clinical measurements within normal limits will improve 09/11/2021 2039 by Shanon Ace, RN Outcome: Progressing 09/11/2021 2038 by Shanon Ace, RN Outcome: Progressing Goal: Will remain free from infection 09/11/2021 2039 by Shanon Ace, RN Outcome: Progressing 09/11/2021 2038 by Shanon Ace, RN Outcome: Progressing Goal: Diagnostic test results will improve 09/11/2021 2039 by Shanon Ace, RN Outcome: Progressing 09/11/2021 2038 by Shanon Ace, RN Outcome: Progressing Goal: Respiratory complications will improve 09/11/2021 2039 by Shanon Ace, RN Outcome: Progressing 09/11/2021 2038 by Shanon Ace, RN Outcome: Progressing Goal: Cardiovascular complication will be avoided 09/11/2021 2039 by Shanon Ace, RN Outcome: Progressing 09/11/2021 2038 by Shanon Ace, RN Outcome: Progressing   Problem: Activity: Goal: Risk for activity intolerance will decrease 09/11/2021 2039 by Shanon Ace, RN Outcome: Progressing 09/11/2021 2038 by Shanon Ace, RN Outcome: Progressing   Problem: Nutrition: Goal: Adequate nutrition will be maintained 09/11/2021 2039 by Shanon Ace, RN Outcome: Progressing 09/11/2021 2038 by Shanon Ace, RN Outcome: Progressing   Problem: Coping: Goal: Level of anxiety will decrease 09/11/2021 2039 by Shanon Ace, RN Outcome:  Progressing 09/11/2021 2038 by Shanon Ace, RN Outcome: Progressing   Problem: Elimination: Goal: Will not experience complications related to bowel motility 09/11/2021 2039 by Shanon Ace, RN Outcome: Progressing 09/11/2021 2038 by Shanon Ace, RN Outcome: Progressing Goal: Will not experience complications related to urinary retention 09/11/2021 2039 by Shanon Ace, RN Outcome: Progressing 09/11/2021 2038 by Shanon Ace, RN Outcome: Progressing   Problem: Pain Managment: Goal: General experience of comfort will improve 09/11/2021 2039 by Shanon Ace, RN Outcome: Progressing 09/11/2021 2038 by Shanon Ace, RN Outcome: Progressing   Problem: Safety: Goal: Ability to remain free from injury will improve 09/11/2021 2039 by Shanon Ace, RN Outcome: Progressing 09/11/2021 2038 by Shanon Ace, RN Outcome: Progressing   Problem: Skin Integrity: Goal: Risk for impaired skin integrity will decrease 09/11/2021 2039 by Shanon Ace, RN Outcome: Progressing 09/11/2021 2038 by Shanon Ace, RN Outcome: Progressing   Problem: Education: Goal: Knowledge of disease and its progression will improve Outcome: Progressing   Problem: Health Behavior/Discharge Planning: Goal: Ability to manage health-related needs will improve Outcome: Progressing   Problem: Clinical Measurements: Goal: Complications related to the disease process or treatment will be avoided or minimized Outcome: Progressing Goal: Dialysis access will remain free of complications Outcome: Progressing   Problem: Activity: Goal: Activity intolerance will improve Outcome: Progressing   Problem: Fluid Volume: Goal: Fluid volume balance will be maintained or improved Outcome: Progressing   Problem: Nutritional: Goal: Ability to make appropriate dietary choices will improve Outcome: Progressing   Problem: Respiratory: Goal: Respiratory symptoms related to disease process will be  avoided Outcome: Progressing   Problem: Self-Concept: Goal: Body image disturbance will be avoided or minimized Outcome: Progressing   Problem: Urinary Elimination: Goal: Progression of disease will be identified and treated Outcome: Progressing

## 2021-09-11 NOTE — Plan of Care (Signed)

## 2021-09-11 NOTE — Progress Notes (Signed)
Initial Nutrition Assessment  DOCUMENTATION CODES:   Severe malnutrition in context of chronic illness, Underweight  INTERVENTION:   Continue Nepro Shake po BID, each supplement provides 425 kcal and 19 grams protein, as pt enjoys the [employer] [facility] [employer]. Ok for pt to continue Ensure as outpatient  Liberalize diet to REGULAR  Add Renal MVI  NUTRITION DIAGNOSIS:   Severe Malnutrition related to chronic illness as evidenced by severe fat depletion, severe muscle depletion.  GOAL:   Patient will meet greater than or equal to 90% of their needs   MONITOR:   PO intake, Supplement acceptance, Labs, Weight trends  REASON FOR ASSESSMENT:   Consult Assessment of nutrition requirement/status  ASSESSMENT:   80 yo female admitted with SOB, hypertensive crisis. PMH includes CKD IV, HTN, HLD, PVD  No recorded po intake. Pt ate 1/2 sandwich, 100% 4 ounce juice, 100% fruit icee for lunch today. Pt reports appetite is improving. Pt reports she drinks Ensure 2x daily at home; pt also takes a one-a-day MVI  Pt reports her weight fluctuates up and down but believes recent UBW to be around 110 pounds. Current wt 95 pounds (43 kg). 13.4% wt loss. Weight appears to have trended down, but unsure when pt last weighed 100 pounds given weight around 100-105 pounds 1 year ago per weight encounters.   Pt does ambulate with walker; reports feeling weak but indicates weakness is improving  Pt currently on Renal Diet with Fluid restriction; pt has CKD IV but is not ESRD. Potassium actually low on admission requiring supplementation, phosphorus wdl.   Labs: phosphorus 2.8 (wdl), potassium 3.7 (wdl-previously low), sodium 134 Meds: megace, Kcl, sodium bicarb   NUTRITION - FOCUSED PHYSICAL EXAM:  Flowsheet Row Most Recent Value  Orbital Region Moderate depletion  Upper Arm Region Severe depletion  Thoracic and Lumbar Region Severe depletion  Buccal Region Severe depletion  Temple Region Moderate  depletion  Clavicle Bone Region Severe depletion  Clavicle and Acromion Bone Region Severe depletion  Scapular Bone Region Severe depletion  Dorsal Hand Moderate depletion  Patellar Region Moderate depletion  Anterior Thigh Region Moderate depletion  Posterior Calf Region Moderate depletion  Edema (RD Assessment) None       Diet Order:   Diet Order             Diet renal with fluid restriction Fluid restriction: 1500 mL Fluid; Room service appropriate? Yes; Fluid consistency: Thin  Diet effective now                   EDUCATION NEEDS:   Not appropriate for education at this time  Skin:  Skin Assessment: Reviewed RN Assessment  Last BM:  11/24  Height:   Ht Readings from Last 1 Encounters:  09/10/21 5\' 3"  (1.6 m)    Weight:   Wt Readings from Last 1 Encounters:  09/10/21 43.1 kg     BMI:  Body mass index is 16.83 kg/m.  Estimated Nutritional Needs:   Kcal:  1500-1700 kcals  Protein:  65-75 g  Fluid:  >/= 1.5   Kerman Passey MS, RDN, LDN, CNSC Registered Dietitian III Clinical Nutrition RD Pager and On-Call Pager Number Located in [place]

## 2021-09-11 NOTE — Progress Notes (Signed)
[facility]  PROGRESS NOTE  Alyssa [name] TIR:[id number] DOB: 1941-02-10 DOA: 09/10/2021 PCP: System, Provider Not In   Brief HPI:   80 year old female with medical history of hypertension, hyperlipidemia, PVD, stage IV CKD, COPD, dementia and prior prolonged admission from 8/29-9 /23, acute respiratory failure associated with LLL collapse with COPD and active herpes infection.  She reports 2 to 3 days of mostly shortness of breath with mild left-sided chest discomfort. VQ scan was ordered to rule out pulmonary embolism, which turned out to be negative for PE.  BNP was elevated to 1000 but did not appear volume overloaded.  She was found to be in hypertensive urgency.  Patient was given labetalol for elevated blood pressure.    Subjective   Patient seen and examined, denies shortness of breath.  Complains of mild chest pain.   Assessment/Plan:     Chest pain/dyspnea -Likely in setting of hypertensive crisis -Patient was started  empirically on heparin GTT for possible PE/ACS -VQ scan was negative for PE, cardiology has ruled out ACS -Mild elevation of troponin was likely from demand ischemia -No further intervention planned per cardiology -We will discontinue heparin at this time  Hypertensive crisis -Resolved -Blood pressure has significantly improved after restarting home medications -Continue amlodipine, carvedilol, clonidine -Patient was taking clonidine 0.2 mg p.o. nightly at home, dose was changed to 0.2 mg p.o. twice daily on admission -Patient blood pressure has now dropped to 100/60; will change clonidine to 0.1 mg p.o. twice daily -We will closely monitor patient's blood pressure in the hospital  Peripheral vascular disease -Continue aspirin, Plavix  Hypokalemia -Patient presents with potassium of less than 2 -Replete  Stage IV CKD -Stable -Continue sodium bicarb tablets  Dementia -Continue Aricept, duloxetine, hydroxyzine  Malnutrition -BMI  16.83 kg per metered square -Likely from poor p.o. intake -Dietitian consulted   Medications     amLODipine  10 mg Oral q morning   aspirin EC  81 mg Oral Daily   carvedilol  12.5 mg Oral BID   cloNIDine  0.2 mg Oral BID   clopidogrel  75 mg Oral Daily   dicyclomine  20 mg Oral TID   docusate sodium  100 mg Oral BID   feeding supplement (NEPRO CARB STEADY)  237 mL Oral BID BM   fluticasone furoate-vilanterol  1 puff Inhalation Daily   And   umeclidinium bromide  1 puff Inhalation Daily   gabapentin  200 mg Oral BID   hydrOXYzine  25 mg Oral QHS   megestrol  40 mg Oral BID   multivitamin  1 tablet Oral QHS   pantoprazole  40 mg Oral Daily   sodium bicarbonate  1,300 mg Oral BID   sodium chloride flush  3 mL Intravenous Q12H     Data Reviewed:   CBG:  No results for input(s): GLUCAP in the last 168 hours.  SpO2: 100 % O2 Flow Rate (L/min): 2 L/min    Vitals:   09/11/21 0700 09/11/21 1007 09/11/21 1100 09/11/21 1102  BP: (!) 145/92  100/60 100/60  Pulse: 74 64 65   Resp: 17  17   Temp: 98.5 F (36.9 C)  98.6 F (37 C)   TempSrc: Oral  Oral   SpO2: 100%  100%   Weight:      Height:         Intake/Output Summary (Last 24 hours) at 09/11/2021 1343 Last data filed at 09/11/2021 0610 Gross per 24 hour  Intake 1297.87 ml  Output --  Net 1297.87 ml    11/23 1901 - 11/25 0700 In: 1297.9 [P.O.:480; I.V.:112.9] Out: -   Filed Weights   09/10/21 1431  Weight: 43.1 kg    Data Reviewed: Basic Metabolic Panel: Recent Labs  Lab 09/10/21 1446 09/10/21 1535 09/10/21 2325 09/11/21 0651 09/11/21 0914  NA 139 139  --  134*  --   K 2.3* <2.0* 2.1* 3.7  --   CL 99  --   --  100  --   CO2 18*  --   --  24  --   GLUCOSE 72  --   --  72  --   BUN 35*  --   --  40*  --   CREATININE 2.78*  --   --  2.53*  --   CALCIUM 8.9  --   --  7.8*  --   MG  --   --  2.7*  --   --   PHOS  --   --   --   --  2.8   Liver Function Tests: Recent Labs  Lab 09/10/21 1446   AST 47*  ALT 32  ALKPHOS 111  BILITOT 1.2  PROT 6.8  ALBUMIN 3.1*   No results for input(s): LIPASE, AMYLASE in the last 168 hours. No results for input(s): AMMONIA in the last 168 hours. CBC: Recent Labs  Lab 09/10/21 1446 09/10/21 1535 09/11/21 0104  WBC 11.1*  --  8.3  NEUTROABS 7.6  --   --   HGB 9.5* 8.5* 7.4*  HCT 29.3* 25.0* 23.4*  MCV 84.7  --  86.0  PLT 662*  --  509*   Cardiac Enzymes: No results for input(s): CKTOTAL, CKMB, CKMBINDEX, TROPONINI in the last 168 hours. BNP (last 3 results) Recent Labs    09/10/21 1446  BNP 1,006.5*    ProBNP (last 3 results) No results for input(s): PROBNP in the last 8760 hours.  CBG: No results for input(s): GLUCAP in the last 168 hours.     Radiology Reports  NM Pulmonary Perfusion  Result Date: 09/10/2021 CLINICAL DATA:  COPD, dementia, short of breath and left-sided chest discomfort EXAM: NUCLEAR MEDICINE PERFUSION LUNG SCAN TECHNIQUE: Perfusion images were obtained in multiple projections after intravenous injection of radiopharmaceutical. Ventilation scans intentionally deferred if perfusion scan and chest x-ray adequate for interpretation during COVID 19 epidemic. RADIOPHARMACEUTICALS:  3.8 mCi Tc-31m MAA IV COMPARISON:  09/10/2021 FINDINGS: Planar images of the chest were obtained in multiple projections during the perfusion examination. Normal distribution of radiotracer is seen throughout the lungs. There are no wedge-shaped perfusion defects to suggest pulmonary embolus. IMPRESSION: 1. No evidence of pulmonary embolus. Electronically Signed   By: Randa Ngo M.D.   On: 09/10/2021 19:55   DG Chest Portable 1 View  Result Date: 09/10/2021 CLINICAL DATA:  Chest pain and shortness of breath. EXAM: PORTABLE CHEST 1 VIEW COMPARISON:  July 25, 2021 FINDINGS: The heart size and mediastinal contours are stable. The aorta is tortuous. Both lungs are clear. The visualized skeletal structures are stable. IMPRESSION: No  active disease. Electronically Signed   By: Abelardo Diesel M.D.   On: 09/10/2021 15:31       Antibiotics: Anti-infectives (From admission, onward)    None         DVT prophylaxis: Heparin  Code Status: DNR  Family Communication: No family at bedside   Consultants: Cardiology  Procedures:     Objective    Physical Examination:   General-appears in no  acute distress Heart-S1-S2, regular, no murmur auscultated Lungs-clear to auscultation bilaterally, no wheezing or crackles auscultated Abdomen-soft, nontender, no organomegaly Extremities-no edema in the lower extremities Neuro-alert, oriented x3, no focal deficit noted Status is: Inpatient  Dispo: The patient is from: Home              Anticipated d/c is to: Home              Anticipated d/c date is: 09/12/2021              Patient currently not stable for discharge  Barrier to discharge-ongoing work-up for chest pain, hypertensive urgency  COVID-19 Labs  No results for input(s): DDIMER, FERRITIN, LDH, CRP in the last 72 hours.  Lab Results  Component Value Date   SARSCOV2NAA NEGATIVE 09/10/2021   [place] NEGATIVE 07/10/2021   [place] NEGATIVE 06/15/2021   [place] NEGATIVE 12/30/2020            Recent Results (from the past 240 hour(s))  Resp Panel by RT-PCR (Flu A&B, Covid) Nasopharyngeal Swab     Status: None   Collection Time: 09/10/21  2:46 PM   Specimen: Nasopharyngeal Swab; Nasopharyngeal(NP) swabs in vial transport medium  Result Value Ref Range Status   SARS Coronavirus 2 by RT PCR NEGATIVE NEGATIVE Final    Comment: (NOTE) SARS-CoV-2 target nucleic acids are NOT DETECTED.  The SARS-CoV-2 RNA is generally detectable in upper respiratory specimens during the acute phase of infection. The lowest concentration of SARS-CoV-2 viral copies this assay can detect is 138 copies/mL. A negative result does not preclude SARS-Cov-2 infection and should not be used as the sole basis  for treatment or other patient management decisions. A negative result may occur with  improper specimen collection/handling, submission of specimen other than nasopharyngeal swab, presence of viral mutation(s) within the areas targeted by this assay, and inadequate number of viral copies(<138 copies/mL). A negative result must be combined with clinical observations, patient history, and epidemiological information. The expected result is Negative.  Fact Sheet for Patients:  EntrepreneurPulse.com.au  Fact Sheet for Healthcare Providers:  IncredibleEmployment.be  This test is no t yet approved or cleared by the [place] FDA and  has been authorized for detection and/or diagnosis of SARS-CoV-2 by FDA under an Emergency Use Authorization (EUA). This EUA will remain  in effect (meaning this test can be used) for the duration of the COVID-19 declaration under Section 564(b)(1) of the Act, 21 U.S.C.section 360bbb-3(b)(1), unless the authorization is terminated  or revoked sooner.       Influenza A by PCR NEGATIVE NEGATIVE Final   Influenza B by PCR NEGATIVE NEGATIVE Final    Comment: (NOTE) The Xpert Xpress SARS-CoV-2/FLU/RSV plus assay is intended as an aid in the diagnosis of influenza from Nasopharyngeal swab specimens and should not be used as a sole basis for treatment. Nasal washings and aspirates are unacceptable for Xpert Xpress SARS-CoV-2/FLU/RSV testing.  Fact Sheet for Patients: EntrepreneurPulse.com.au  Fact Sheet for Healthcare Providers: IncredibleEmployment.be  This test is not yet approved or cleared by the [place] FDA and has been authorized for detection and/or diagnosis of SARS-CoV-2 by FDA under an Emergency Use Authorization (EUA). This EUA will remain in effect (meaning this test can be used) for the duration of the COVID-19 declaration under Section 564(b)(1) of the Act, 21  U.S.C. section 360bbb-3(b)(1), unless the authorization is terminated or revoked.  Performed at [facility], [place] [address], [place], [place] [address]   MRSA Next  Gen by PCR, Nasal     Status: None   Collection Time: 09/11/21  4:11 AM   Specimen: Nasal Mucosa; Nasal Swab  Result Value Ref Range Status   MRSA by PCR Next Gen NOT DETECTED NOT DETECTED Final    Comment: (NOTE) The GeneXpert MRSA Assay (FDA approved for NASAL specimens only), is one component of a comprehensive MRSA colonization surveillance program. It is not intended to diagnose MRSA infection nor to guide or monitor treatment for MRSA infections. Test performance is not FDA approved in patients less than 26 years old. Performed at [facility], [place] [address] [place], [place] [address]     [facility] If 7PM-7AM, please contact night-coverage at [email or web address], Office  [phone]   09/11/2021, 1:43 PM  LOS: 0 days

## 2021-09-11 NOTE — Progress Notes (Signed)
Date and time results received: 09/11/21 0016  (use smartphrase ".now" to insert current time)  Test: potassium  Critical Value: 2.1  Name of Provider Notified: [place]  Orders Received? Or Actions Taken?: Orders Received - See Orders for details

## 2021-09-12 DIAGNOSIS — Z20822 Contact with and (suspected) exposure to covid-19: Secondary | ICD-10-CM | POA: Diagnosis present

## 2021-09-12 DIAGNOSIS — J449 Chronic obstructive pulmonary disease, unspecified: Secondary | ICD-10-CM | POA: Diagnosis present

## 2021-09-12 DIAGNOSIS — E861 Hypovolemia: Secondary | ICD-10-CM

## 2021-09-12 DIAGNOSIS — Z7982 Long term (current) use of aspirin: Secondary | ICD-10-CM | POA: Diagnosis not present

## 2021-09-12 DIAGNOSIS — Z01810 Encounter for preprocedural cardiovascular examination: Secondary | ICD-10-CM | POA: Diagnosis not present

## 2021-09-12 DIAGNOSIS — S301XXA Contusion of abdominal wall, initial encounter: Secondary | ICD-10-CM | POA: Diagnosis present

## 2021-09-12 DIAGNOSIS — I129 Hypertensive chronic kidney disease with stage 1 through stage 4 chronic kidney disease, or unspecified chronic kidney disease: Secondary | ICD-10-CM | POA: Diagnosis present

## 2021-09-12 DIAGNOSIS — Z7902 Long term (current) use of antithrombotics/antiplatelets: Secondary | ICD-10-CM | POA: Diagnosis not present

## 2021-09-12 DIAGNOSIS — E785 Hyperlipidemia, unspecified: Secondary | ICD-10-CM | POA: Diagnosis present

## 2021-09-12 DIAGNOSIS — I724 Aneurysm of artery of lower extremity: Secondary | ICD-10-CM | POA: Diagnosis present

## 2021-09-12 DIAGNOSIS — R1012 Left upper quadrant pain: Secondary | ICD-10-CM | POA: Diagnosis present

## 2021-09-12 DIAGNOSIS — R0602 Shortness of breath: Secondary | ICD-10-CM | POA: Diagnosis not present

## 2021-09-12 DIAGNOSIS — Z96641 Presence of right artificial hip joint: Secondary | ICD-10-CM | POA: Diagnosis present

## 2021-09-12 DIAGNOSIS — X58XXXA Exposure to other specified factors, initial encounter: Secondary | ICD-10-CM | POA: Diagnosis present

## 2021-09-12 DIAGNOSIS — Z79899 Other long term (current) drug therapy: Secondary | ICD-10-CM | POA: Diagnosis not present

## 2021-09-12 DIAGNOSIS — G629 Polyneuropathy, unspecified: Secondary | ICD-10-CM | POA: Diagnosis present

## 2021-09-12 DIAGNOSIS — T82898A Other specified complication of vascular prosthetic devices, implants and grafts, initial encounter: Secondary | ICD-10-CM | POA: Diagnosis not present

## 2021-09-12 DIAGNOSIS — R079 Chest pain, unspecified: Secondary | ICD-10-CM | POA: Diagnosis not present

## 2021-09-12 DIAGNOSIS — I169 Hypertensive crisis, unspecified: Secondary | ICD-10-CM | POA: Diagnosis present

## 2021-09-12 DIAGNOSIS — Z66 Do not resuscitate: Secondary | ICD-10-CM | POA: Diagnosis present

## 2021-09-12 DIAGNOSIS — I10 Essential (primary) hypertension: Secondary | ICD-10-CM | POA: Diagnosis not present

## 2021-09-12 DIAGNOSIS — I9589 Other hypotension: Secondary | ICD-10-CM

## 2021-09-12 DIAGNOSIS — R778 Other specified abnormalities of plasma proteins: Secondary | ICD-10-CM | POA: Diagnosis not present

## 2021-09-12 DIAGNOSIS — D631 Anemia in chronic kidney disease: Secondary | ICD-10-CM | POA: Diagnosis present

## 2021-09-12 DIAGNOSIS — I161 Hypertensive emergency: Secondary | ICD-10-CM | POA: Diagnosis present

## 2021-09-12 DIAGNOSIS — I739 Peripheral vascular disease, unspecified: Secondary | ICD-10-CM | POA: Diagnosis present

## 2021-09-12 DIAGNOSIS — I248 Other forms of acute ischemic heart disease: Secondary | ICD-10-CM | POA: Diagnosis present

## 2021-09-12 DIAGNOSIS — N184 Chronic kidney disease, stage 4 (severe): Secondary | ICD-10-CM | POA: Diagnosis present

## 2021-09-12 DIAGNOSIS — F0390 Unspecified dementia without behavioral disturbance: Secondary | ICD-10-CM | POA: Diagnosis present

## 2021-09-12 DIAGNOSIS — E876 Hypokalemia: Secondary | ICD-10-CM | POA: Diagnosis present

## 2021-09-12 DIAGNOSIS — N179 Acute kidney failure, unspecified: Secondary | ICD-10-CM | POA: Diagnosis present

## 2021-09-12 DIAGNOSIS — E43 Unspecified severe protein-calorie malnutrition: Secondary | ICD-10-CM | POA: Diagnosis present

## 2021-09-12 DIAGNOSIS — Z681 Body mass index (BMI) 19 or less, adult: Secondary | ICD-10-CM | POA: Diagnosis not present

## 2021-09-12 DIAGNOSIS — I959 Hypotension, unspecified: Secondary | ICD-10-CM | POA: Diagnosis not present

## 2021-09-12 LAB — RETICULOCYTES
Immature Retic Fract: 18 % — ABNORMAL HIGH (ref 2.3–15.9)
RBC.: 3.01 MIL/uL — ABNORMAL LOW (ref 3.87–5.11)
Retic Count, Absolute: 62 10*3/uL (ref 19.0–186.0)
Retic Ct Pct: 2.1 % (ref 0.4–3.1)

## 2021-09-12 LAB — CBC
HCT: 21.3 % — ABNORMAL LOW (ref 36.0–46.0)
Hemoglobin: 7.2 g/dL — ABNORMAL LOW (ref 12.0–15.0)
MCH: 28.3 pg (ref 26.0–34.0)
MCHC: 33.8 g/dL (ref 30.0–36.0)
MCV: 83.9 fL (ref 80.0–100.0)
Platelets: 470 10*3/uL — ABNORMAL HIGH (ref 150–400)
RBC: 2.54 MIL/uL — ABNORMAL LOW (ref 3.87–5.11)
RDW: 17.8 % — ABNORMAL HIGH (ref 11.5–15.5)
WBC: 7.5 10*3/uL (ref 4.0–10.5)
nRBC: 0 % (ref 0.0–0.2)

## 2021-09-12 LAB — BASIC METABOLIC PANEL
Anion gap: 8 (ref 5–15)
BUN: 49 mg/dL — ABNORMAL HIGH (ref 8–23)
CO2: 25 mmol/L (ref 22–32)
Calcium: 7.7 mg/dL — ABNORMAL LOW (ref 8.9–10.3)
Chloride: 96 mmol/L — ABNORMAL LOW (ref 98–111)
Creatinine, Ser: 3.02 mg/dL — ABNORMAL HIGH (ref 0.44–1.00)
GFR, Estimated: 15 mL/min — ABNORMAL LOW (ref >60)
Glucose, Bld: 118 mg/dL — ABNORMAL HIGH (ref 70–99)
Potassium: 3.9 mmol/L (ref 3.5–5.1)
Sodium: 129 mmol/L — ABNORMAL LOW (ref 135–145)

## 2021-09-12 LAB — HEPARIN LEVEL (UNFRACTIONATED): Heparin Unfractionated: <0.1 IU/mL — ABNORMAL LOW (ref 0.30–0.70)

## 2021-09-12 LAB — IRON AND TIBC
Iron: 60 ug/dL (ref 28–170)
Saturation Ratios: 27 % (ref 10.4–31.8)
TIBC: 221 ug/dL — ABNORMAL LOW (ref 250–450)
UIBC: 161 ug/dL

## 2021-09-12 LAB — VITAMIN B12: Vitamin B-12: 1802 pg/mL — ABNORMAL HIGH (ref 180–914)

## 2021-09-12 LAB — FERRITIN: Ferritin: 897 ng/mL — ABNORMAL HIGH (ref 11–307)

## 2021-09-12 LAB — FOLATE: Folate: 20.7 ng/mL (ref >5.9)

## 2021-09-12 MED ORDER — HEPARIN SODIUM (PORCINE) 5000 UNIT/ML IJ SOLN
5000.0000 [IU] | Freq: Three times a day (TID) | INTRAMUSCULAR | Status: DC
  Administered 2021-09-12 – 2021-09-17 (×14): 5000 [IU] via SUBCUTANEOUS
  Filled 2021-09-12 (×14): qty 1

## 2021-09-12 MED ORDER — SODIUM CHLORIDE 0.9 % IV BOLUS: 250.0000 mL | Freq: Once | INTRAVENOUS | Status: DC

## 2021-09-12 NOTE — Progress Notes (Signed)
RN started NS bolus as ordered and IV infiltrated approximately 2-3 mins after. Pt's L forearm swollen. Bolus turned off, IV removed, arm elevated, hot pack applied. RN spoke with MD who is coming to bedside. RN continuing to monitor.  RN noted that swelling has decreased, pt states that it "feels better". RN will continue to monitor.

## 2021-09-12 NOTE — Progress Notes (Signed)
Pharmacist Heart Failure Core Measure Documentation  Assessment: Alyssa [name] has an EF documented as 55-60% on 05/29/2020 by Dr. Claiborne Billings.  Rationale: Heart failure patients with left ventricular systolic dysfunction (LVSD) and an EF < 40% should be prescribed an angiotensin converting enzyme inhibitor (ACEI) or angiotensin receptor blocker (ARB) at discharge unless a contraindication is documented in the medical record.  This patient is not currently on an ACEI or ARB for HF.  This note is being placed in the record in order to provide documentation that a contraindication to the use of these agents is present for this encounter.  ACE Inhibitor or Angiotensin Receptor Blocker is contraindicated (specify all that apply)  []   ACEI allergy AND ARB allergy []   Angioedema []   Moderate or severe aortic stenosis []   Hyperkalemia []   Hypotension []   Renal artery stenosis [x]   Worsening renal function, preexisting renal disease or dysfunction   Alyssa [name] 09/12/2021 3:46 PM

## 2021-09-12 NOTE — Progress Notes (Signed)
[facility]  PROGRESS NOTE  Alyssa [name] FFM:[id number] DOB: 10-01-1941 DOA: 09/10/2021 PCP: System, Provider Not In   Brief HPI:   80 year old female with medical history of hypertension, hyperlipidemia, PVD, stage IV CKD, COPD, dementia and prior prolonged admission from 8/29-9 /23, acute respiratory failure associated with LLL collapse with COPD and active herpes infection.  She reports 2 to 3 days of mostly shortness of breath with mild left-sided chest discomfort. VQ scan was ordered to rule out pulmonary embolism, which turned out to be negative for PE.  BNP was elevated to 1000 but did not appear volume overloaded.  She was found to be in hypertensive urgency.  Patient was given labetalol for elevated blood pressure.    Subjective   Patient seen and examined, now she is hypotensive with SBP in the upper 90s to low 100s.  Denies any chest pain or shortness of breath   Assessment/Plan:     Chest pain/dyspnea -Likely in setting of hypertensive crisis -Patient was started  empirically on heparin GTT for possible PE/ACS -VQ scan was negative for PE, cardiology has ruled out ACS -Mild elevation of troponin was likely from demand ischemia -No further intervention planned per cardiology -Heparin was discontinued  -We will order echocardiogram as recommended by cardiology  Hypertensive crisis -Resolved -Blood pressure is now low -She was started on home medications, amlodipine carvedilol and clonidine. -Dose of clonidine was changed to 0.2 mg p.o. twice daily -Patient has become hypotensive since dose of clonidine was increased -Dose of clonidine was changed to 0.1 mg p.o. twice daily; patient continues to remain hypotensive -Plan-we will discontinue clonidine, amlodipine.  Also hold Coreg for the morning dose -We will give normal saline bolus 500 cc x 1  Acute kidney injury on stage IV CKD -Patient's baseline creatinine is around 2.75-2.85 -Today creatinine  jumped to 3.02; likely from hypotension as above -We will hold antihypertensive medications as above -Give normal saline bolus 500 cc x 1 -We will monitor patient's BUN/creatinine in a.m. -Continue sodium bicarb tablets  Peripheral vascular disease -Continue aspirin, Plavix  Hypokalemia -Patient presents with potassium of less than 2 -Replete   Dementia -Continue Aricept, duloxetine, hydroxyzine  Malnutrition -BMI 16.83 kg per metered square -Likely from poor p.o. intake -Dietitian consulted  Anemia of chronic disease -Hemoglobin is low at 7.2 today -We will check anemia panel -Check FOBT   Medications     aspirin EC  81 mg Oral Daily   carvedilol  12.5 mg Oral BID   clopidogrel  75 mg Oral Daily   dicyclomine  20 mg Oral TID   docusate sodium  100 mg Oral BID   feeding supplement (NEPRO CARB STEADY)  237 mL Oral BID BM   fluticasone furoate-vilanterol  1 puff Inhalation Daily   And   umeclidinium bromide  1 puff Inhalation Daily   gabapentin  200 mg Oral BID   hydrOXYzine  25 mg Oral QHS   megestrol  40 mg Oral BID   multivitamin  1 tablet Oral QHS   pantoprazole  40 mg Oral Daily   sodium bicarbonate  1,300 mg Oral BID   sodium chloride flush  3 mL Intravenous Q12H     Data Reviewed:   CBG:  No results for input(s): GLUCAP in the last 168 hours.  SpO2: 100 % O2 Flow Rate (L/min): 3 L/min    Vitals:   09/12/21 0100 09/12/21 0345 09/12/21 0800 09/12/21 0816  BP: 103/66 103/68 (!) 94/57   Pulse:  63 (!) 56 61   Resp: 18 19 16    Temp:  97.8 F (36.6 C) 97.6 F (36.4 C)   TempSrc:  Oral Oral   SpO2: 100% 100% 100% 100%  Weight:      Height:         Intake/Output Summary (Last 24 hours) at 09/12/2021 0953 Last data filed at 09/12/2021 0830 Gross per 24 hour  Intake 1138.23 ml  Output --  Net 1138.23 ml    11/24 1901 - 11/26 0700 In: 2458.9 [P.O.:1680; I.V.:171.1] Out: -   Filed Weights   09/10/21 1431  Weight: 43.1 kg    Data  Reviewed: Basic Metabolic Panel: Recent Labs  Lab 09/10/21 1446 09/10/21 1535 09/10/21 2325 09/11/21 0651 09/11/21 0914 09/12/21 0052  NA 139 139  --  134*  --  129*  K 2.3* <2.0* 2.1* 3.7  --  3.9  CL 99  --   --  100  --  96*  CO2 18*  --   --  24  --  25  GLUCOSE 72  --   --  72  --  118*  BUN 35*  --   --  40*  --  49*  CREATININE 2.78*  --   --  2.53*  --  3.02*  CALCIUM 8.9  --   --  7.8*  --  7.7*  MG  --   --  2.7*  --   --   --   PHOS  --   --   --   --  2.8  --    Liver Function Tests: Recent Labs  Lab 09/10/21 1446  AST 47*  ALT 32  ALKPHOS 111  BILITOT 1.2  PROT 6.8  ALBUMIN 3.1*   No results for input(s): LIPASE, AMYLASE in the last 168 hours. No results for input(s): AMMONIA in the last 168 hours. CBC: Recent Labs  Lab 09/10/21 1446 09/10/21 1535 09/11/21 0104 09/12/21 0052  WBC 11.1*  --  8.3 7.5  NEUTROABS 7.6  --   --   --   HGB 9.5* 8.5* 7.4* 7.2*  HCT 29.3* 25.0* 23.4* 21.3*  MCV 84.7  --  86.0 83.9  PLT 662*  --  509* 470*   Cardiac Enzymes: No results for input(s): CKTOTAL, CKMB, CKMBINDEX, TROPONINI in the last 168 hours. BNP (last 3 results) Recent Labs    09/10/21 1446  BNP 1,006.5*    ProBNP (last 3 results) No results for input(s): PROBNP in the last 8760 hours.  CBG: No results for input(s): GLUCAP in the last 168 hours.     Radiology Reports  NM Pulmonary Perfusion  Result Date: 09/10/2021 CLINICAL DATA:  COPD, dementia, short of breath and left-sided chest discomfort EXAM: NUCLEAR MEDICINE PERFUSION LUNG SCAN TECHNIQUE: Perfusion images were obtained in multiple projections after intravenous injection of radiopharmaceutical. Ventilation scans intentionally deferred if perfusion scan and chest x-ray adequate for interpretation during COVID 19 epidemic. RADIOPHARMACEUTICALS:  3.8 mCi Tc-7m MAA IV COMPARISON:  09/10/2021 FINDINGS: Planar images of the chest were obtained in multiple projections during the perfusion  examination. Normal distribution of radiotracer is seen throughout the lungs. There are no wedge-shaped perfusion defects to suggest pulmonary embolus. IMPRESSION: 1. No evidence of pulmonary embolus. Electronically Signed   By: Randa Ngo M.D.   On: 09/10/2021 19:55   DG Chest Portable 1 View  Result Date: 09/10/2021 CLINICAL DATA:  Chest pain and shortness of breath. EXAM: PORTABLE CHEST 1 VIEW COMPARISON:  July 25, 2021 FINDINGS: The heart size and mediastinal contours are stable. The aorta is tortuous. Both lungs are clear. The visualized skeletal structures are stable. IMPRESSION: No active disease. Electronically Signed   By: Abelardo Diesel M.D.   On: 09/10/2021 15:31       Antibiotics: Anti-infectives (From admission, onward)    None         DVT prophylaxis: Heparin  Code Status: DNR  Family Communication: No family at bedside   Consultants: Cardiology  Procedures:     Objective    Physical Examination:  General-appears in no acute distress Heart-S1-S2, regular, no murmur auscultated Lungs-clear to auscultation bilaterally, no wheezing or crackles auscultated Abdomen-soft, nontender, no organomegaly Extremities-no edema in the lower extremities Neuro-alert, oriented x3, no focal deficit noted  Status is: Inpatient  Dispo: The patient is from: Home              Anticipated d/c is to: Home              Anticipated d/c date is: 09/14/2021              Patient currently not stable for discharge  Barrier to discharge-ongoing work-up for chest pain, hypertensive urgency  COVID-19 Labs  No results for input(s): DDIMER, FERRITIN, LDH, CRP in the last 72 hours.  Lab Results  Component Value Date   SARSCOV2NAA NEGATIVE 09/10/2021   [place] NEGATIVE 07/10/2021   [place] NEGATIVE 06/15/2021   [place] NEGATIVE 12/30/2020            Recent Results (from the past 240 hour(s))  Resp Panel by RT-PCR (Flu A&B, Covid) Nasopharyngeal  Swab     Status: None   Collection Time: 09/10/21  2:46 PM   Specimen: Nasopharyngeal Swab; Nasopharyngeal(NP) swabs in vial transport medium  Result Value Ref Range Status   SARS Coronavirus 2 by RT PCR NEGATIVE NEGATIVE Final    Comment: (NOTE) SARS-CoV-2 target nucleic acids are NOT DETECTED.  The SARS-CoV-2 RNA is generally detectable in upper respiratory specimens during the acute phase of infection. The lowest concentration of SARS-CoV-2 viral copies this assay can detect is 138 copies/mL. A negative result does not preclude SARS-Cov-2 infection and should not be used as the sole basis for treatment or other patient management decisions. A negative result may occur with  improper specimen collection/handling, submission of specimen other than nasopharyngeal swab, presence of viral mutation(s) within the areas targeted by this assay, and inadequate number of viral copies(<138 copies/mL). A negative result must be combined with clinical observations, patient history, and epidemiological information. The expected result is Negative.  Fact Sheet for Patients:  EntrepreneurPulse.com.au  Fact Sheet for Healthcare Providers:  IncredibleEmployment.be  This test is no t yet approved or cleared by the [place] FDA and  has been authorized for detection and/or diagnosis of SARS-CoV-2 by FDA under an Emergency Use Authorization (EUA). This EUA will remain  in effect (meaning this test can be used) for the duration of the COVID-19 declaration under Section 564(b)(1) of the Act, 21 U.S.C.section 360bbb-3(b)(1), unless the authorization is terminated  or revoked sooner.       Influenza A by PCR NEGATIVE NEGATIVE Final   Influenza B by PCR NEGATIVE NEGATIVE Final    Comment: (NOTE) The Xpert Xpress SARS-CoV-2/FLU/RSV plus assay is intended as an aid in the diagnosis of influenza from Nasopharyngeal swab specimens and should not be used as a sole  basis for treatment. Nasal washings and aspirates are unacceptable for Xpert  Xpress SARS-CoV-2/FLU/RSV testing.  Fact Sheet for Patients: EntrepreneurPulse.com.au  Fact Sheet for Healthcare Providers: IncredibleEmployment.be  This test is not yet approved or cleared by the [place] FDA and has been authorized for detection and/or diagnosis of SARS-CoV-2 by FDA under an Emergency Use Authorization (EUA). This EUA will remain in effect (meaning this test can be used) for the duration of the COVID-19 declaration under Section 564(b)(1) of the Act, 21 U.S.C. section 360bbb-3(b)(1), unless the authorization is terminated or revoked.  Performed at [facility], [place] [address] [place], [place] [address]   MRSA Next Gen by PCR, Nasal     Status: None   Collection Time: 09/11/21  4:11 AM   Specimen: Nasal Mucosa; Nasal Swab  Result Value Ref Range Status   MRSA by PCR Next Gen NOT DETECTED NOT DETECTED Final    Comment: (NOTE) The GeneXpert MRSA Assay (FDA approved for NASAL specimens only), is one component of a comprehensive MRSA colonization surveillance program. It is not intended to diagnose MRSA infection nor to guide or monitor treatment for MRSA infections. Test performance is not FDA approved in patients less than 64 years old. Performed at [facility], [place] [address], [place], [place] [address]     [facility] If 7PM-7AM, please contact night-coverage at [email or web address], Office  [phone]   09/12/2021, 9:53 AM  LOS: 0 days

## 2021-09-13 ENCOUNTER — Inpatient Hospital Stay (HOSPITAL_COMMUNITY): Payer: Medicare HMO

## 2021-09-13 DIAGNOSIS — R0602 Shortness of breath: Secondary | ICD-10-CM | POA: Diagnosis not present

## 2021-09-13 DIAGNOSIS — I10 Essential (primary) hypertension: Secondary | ICD-10-CM | POA: Diagnosis not present

## 2021-09-13 DIAGNOSIS — R079 Chest pain, unspecified: Secondary | ICD-10-CM | POA: Diagnosis not present

## 2021-09-13 DIAGNOSIS — N179 Acute kidney failure, unspecified: Secondary | ICD-10-CM | POA: Diagnosis not present

## 2021-09-13 DIAGNOSIS — I169 Hypertensive crisis, unspecified: Secondary | ICD-10-CM

## 2021-09-13 LAB — CBC
HCT: 21.7 % — ABNORMAL LOW (ref 36.0–46.0)
Hemoglobin: 7.2 g/dL — ABNORMAL LOW (ref 12.0–15.0)
MCH: 28.1 pg (ref 26.0–34.0)
MCHC: 33.2 g/dL (ref 30.0–36.0)
MCV: 84.8 fL (ref 80.0–100.0)
Platelets: 455 10*3/uL — ABNORMAL HIGH (ref 150–400)
RBC: 2.56 MIL/uL — ABNORMAL LOW (ref 3.87–5.11)
RDW: 18 % — ABNORMAL HIGH (ref 11.5–15.5)
WBC: 8.8 10*3/uL (ref 4.0–10.5)
nRBC: 0 % (ref 0.0–0.2)

## 2021-09-13 LAB — ECHOCARDIOGRAM COMPLETE
Area-P 1/2: 3.91 cm2
Height: 63 in
S' Lateral: 2.8 cm
Weight: 1520.29 oz

## 2021-09-13 LAB — BASIC METABOLIC PANEL
Anion gap: 8 (ref 5–15)
BUN: 54 mg/dL — ABNORMAL HIGH (ref 8–23)
CO2: 25 mmol/L (ref 22–32)
Calcium: 7.8 mg/dL — ABNORMAL LOW (ref 8.9–10.3)
Chloride: 97 mmol/L — ABNORMAL LOW (ref 98–111)
Creatinine, Ser: 2.93 mg/dL — ABNORMAL HIGH (ref 0.44–1.00)
GFR, Estimated: 16 mL/min — ABNORMAL LOW (ref >60)
Glucose, Bld: 148 mg/dL — ABNORMAL HIGH (ref 70–99)
Potassium: 4 mmol/L (ref 3.5–5.1)
Sodium: 130 mmol/L — ABNORMAL LOW (ref 135–145)

## 2021-09-13 NOTE — Plan of Care (Signed)

## 2021-09-13 NOTE — Progress Notes (Signed)
  Echocardiogram 2D Echocardiogram has been performed.  Johny Chess 09/13/2021, 4:08 PM

## 2021-09-13 NOTE — Progress Notes (Signed)
[facility]  PROGRESS NOTE  Alyssa [name] VHQ:[id number] DOB: 1941/07/15 DOA: 09/10/2021 PCP: System, Provider Not In   Brief HPI:   80 year old female with medical history of hypertension, hyperlipidemia, PVD, stage IV CKD, COPD, dementia and prior prolonged admission from 8/29-9 /23, acute respiratory failure associated with LLL collapse with COPD and active herpes infection.  She reports 2 to 3 days of mostly shortness of breath with mild left-sided chest discomfort. VQ scan was ordered to rule out pulmonary embolism, which turned out to be negative for PE.  BNP was elevated to 1000 but did not appear volume overloaded.  She was found to be in hypertensive urgency.  Patient was given labetalol for elevated blood pressure.    Subjective   Patient seen, blood pressure has significantly improved.  Denies shortness of breath.   Assessment/Plan:     Chest pain/dyspnea -Likely in setting of hypertensive crisis -Patient was started  empirically on heparin GTT for possible PE/ACS -VQ scan was negative for PE, cardiology has ruled out ACS -Mild elevation of troponin was likely from demand ischemia -No further intervention planned per cardiology -Heparin was discontinued  -We will order echocardiogram as recommended by cardiology  Hypertensive crisis -Resolved  Hypotension -Resolved -Blood pressure dropped after starting home medications  -She was started on home medications, amlodipine carvedilol and clonidine. -Dose of clonidine was changed to 0.2 mg p.o. twice daily -Patient has become hypotensive since dose of clonidine was increased -Dose of clonidine was changed to 0.1 mg p.o. twice daily; patient continues to remain hypotensive -Amlodipine was discontinued -Continue Coreg 12.5 mg p.o. twice daily, clonidine 0.1 mg p.o. twice daily  Acute kidney injury on stage IV CKD -Patient's baseline creatinine is around 2.75-2.85 -Creatinine jumped to 3.02 -Creatinine  has now improved to 2.93 -Follow renal function in a.m.   Peripheral vascular disease -Continue aspirin, Plavix  Hypokalemia -Patient presents with potassium of less than 2 -Replete   Dementia -Continue Aricept, duloxetine, hydroxyzine  Malnutrition -BMI 16.83 kg per metered square -Likely from poor p.o. intake -Dietitian consulted  Anemia of chronic disease -Hemoglobin is low at 7.2 today -Serum iron 60, saturation 27%, B12 1802 -Check FOBT   Medications     aspirin EC  81 mg Oral Daily   carvedilol  12.5 mg Oral BID   clopidogrel  75 mg Oral Daily   dicyclomine  20 mg Oral TID   docusate sodium  100 mg Oral BID   feeding supplement (NEPRO CARB STEADY)  237 mL Oral BID BM   fluticasone furoate-vilanterol  1 puff Inhalation Daily   And   umeclidinium bromide  1 puff Inhalation Daily   heparin injection (subcutaneous)  5,000 Units Subcutaneous Q8H   hydrOXYzine  25 mg Oral QHS   megestrol  40 mg Oral BID   multivitamin  1 tablet Oral QHS   pantoprazole  40 mg Oral Daily   sodium bicarbonate  1,300 mg Oral BID   sodium chloride flush  3 mL Intravenous Q12H     Data Reviewed:   CBG:  No results for input(s): GLUCAP in the last 168 hours.  SpO2: 97 % O2 Flow Rate (L/min): 3 L/min    Vitals:   09/13/21 0805 09/13/21 1016 09/13/21 1127 09/13/21 1605  BP: 111/69  121/73 137/70  Pulse: 78 78 89 83  Resp: 18  20 20   Temp: 98 F (36.7 C)  98.8 F (37.1 C) 98.4 F (36.9 C)  TempSrc: Oral  Oral Oral  SpO2: 100%  95% 97%  Weight:      Height:         Intake/Output Summary (Last 24 hours) at 09/13/2021 1632 Last data filed at 09/13/2021 1129 Gross per 24 hour  Intake 480 ml  Output 950 ml  Net -470 ml    11/25 1901 - 11/27 0700 In: 76 [P.O.:840; I.V.:6] Out: 400 [Urine:400]  Filed Weights   09/10/21 1431  Weight: 43.1 kg    Data Reviewed: Basic Metabolic Panel: Recent Labs  Lab 09/10/21 1446 09/10/21 1535 09/10/21 2325 09/11/21 0651  09/11/21 0914 09/12/21 0052 09/13/21 0046  NA 139 139  --  134*  --  129* 130*  K 2.3* <2.0* 2.1* 3.7  --  3.9 4.0  CL 99  --   --  100  --  96* 97*  CO2 18*  --   --  24  --  25 25  GLUCOSE 72  --   --  72  --  118* 148*  BUN 35*  --   --  40*  --  49* 54*  CREATININE 2.78*  --   --  2.53*  --  3.02* 2.93*  CALCIUM 8.9  --   --  7.8*  --  7.7* 7.8*  MG  --   --  2.7*  --   --   --   --   PHOS  --   --   --   --  2.8  --   --    Liver Function Tests: Recent Labs  Lab 09/10/21 1446  AST 47*  ALT 32  ALKPHOS 111  BILITOT 1.2  PROT 6.8  ALBUMIN 3.1*   No results for input(s): LIPASE, AMYLASE in the last 168 hours. No results for input(s): AMMONIA in the last 168 hours. CBC: Recent Labs  Lab 09/10/21 1446 09/10/21 1535 09/11/21 0104 09/12/21 0052 09/13/21 0046  WBC 11.1*  --  8.3 7.5 8.8  NEUTROABS 7.6  --   --   --   --   HGB 9.5* 8.5* 7.4* 7.2* 7.2*  HCT 29.3* 25.0* 23.4* 21.3* 21.7*  MCV 84.7  --  86.0 83.9 84.8  PLT 662*  --  509* 470* 455*   Cardiac Enzymes: No results for input(s): CKTOTAL, CKMB, CKMBINDEX, TROPONINI in the last 168 hours. BNP (last 3 results) Recent Labs    09/10/21 1446  BNP 1,006.5*    ProBNP (last 3 results) No results for input(s): PROBNP in the last 8760 hours.  CBG: No results for input(s): GLUCAP in the last 168 hours.     Radiology Reports  No results found.     Antibiotics: Anti-infectives (From admission, onward)    None         DVT prophylaxis: Heparin  Code Status: DNR  Family Communication: No family at bedside   Consultants: Cardiology  Procedures:     Objective    Physical Examination:  General-appears in no acute distress Heart-S1-S2, regular, no murmur auscultated Lungs-clear to auscultation bilaterally, no wheezing or crackles auscultated Abdomen-soft, nontender, no organomegaly Extremities-no edema in the lower extremities Neuro-alert, oriented x3, no focal deficit  noted  Status is: Inpatient  Dispo: The patient is from: Home              Anticipated d/c is to: Home              Anticipated d/c date is: 09/14/2021  Patient currently not stable for discharge  Barrier to discharge-ongoing work-up for chest pain, hypertensive urgency  COVID-19 Labs  Recent Labs    09/12/21 1004  FERRITIN 897*    Lab Results  Component Value Date   SARSCOV2NAA NEGATIVE 09/10/2021   [place] NEGATIVE 07/10/2021   [place] NEGATIVE 06/15/2021   [place] NEGATIVE 12/30/2020            Recent Results (from the past 240 hour(s))  Resp Panel by RT-PCR (Flu A&B, Covid) Nasopharyngeal Swab     Status: None   Collection Time: 09/10/21  2:46 PM   Specimen: Nasopharyngeal Swab; Nasopharyngeal(NP) swabs in vial transport medium  Result Value Ref Range Status   SARS Coronavirus 2 by RT PCR NEGATIVE NEGATIVE Final    Comment: (NOTE) SARS-CoV-2 target nucleic acids are NOT DETECTED.  The SARS-CoV-2 RNA is generally detectable in upper respiratory specimens during the acute phase of infection. The lowest concentration of SARS-CoV-2 viral copies this assay can detect is 138 copies/mL. A negative result does not preclude SARS-Cov-2 infection and should not be used as the sole basis for treatment or other patient management decisions. A negative result may occur with  improper specimen collection/handling, submission of specimen other than nasopharyngeal swab, presence of viral mutation(s) within the areas targeted by this assay, and inadequate number of viral copies(<138 copies/mL). A negative result must be combined with clinical observations, patient history, and epidemiological information. The expected result is Negative.  Fact Sheet for Patients:  EntrepreneurPulse.com.au  Fact Sheet for Healthcare Providers:  IncredibleEmployment.be  This test is no t yet approved or cleared by the [place] FDA and  has been authorized for detection and/or diagnosis of SARS-CoV-2 by FDA under an Emergency Use Authorization (EUA). This EUA will remain  in effect (meaning this test can be used) for the duration of the COVID-19 declaration under Section 564(b)(1) of the Act, 21 U.S.C.section 360bbb-3(b)(1), unless the authorization is terminated  or revoked sooner.       Influenza A by PCR NEGATIVE NEGATIVE Final   Influenza B by PCR NEGATIVE NEGATIVE Final    Comment: (NOTE) The Xpert Xpress SARS-CoV-2/FLU/RSV plus assay is intended as an aid in the diagnosis of influenza from Nasopharyngeal swab specimens and should not be used as a sole basis for treatment. Nasal washings and aspirates are unacceptable for Xpert Xpress SARS-CoV-2/FLU/RSV testing.  Fact Sheet for Patients: EntrepreneurPulse.com.au  Fact Sheet for Healthcare Providers: IncredibleEmployment.be  This test is not yet approved or cleared by the [place] FDA and has been authorized for detection and/or diagnosis of SARS-CoV-2 by FDA under an Emergency Use Authorization (EUA). This EUA will remain in effect (meaning this test can be used) for the duration of the COVID-19 declaration under Section 564(b)(1) of the Act, 21 U.S.C. section 360bbb-3(b)(1), unless the authorization is terminated or revoked.  Performed at [facility], [place] [address] [place], [place] [address]   MRSA Next Gen by PCR, Nasal     Status: None   Collection Time: 09/11/21  4:11 AM   Specimen: Nasal Mucosa; Nasal Swab  Result Value Ref Range Status   MRSA by PCR Next Gen NOT DETECTED NOT DETECTED Final    Comment: (NOTE) The GeneXpert MRSA Assay (FDA approved for NASAL specimens only), is one component of a comprehensive MRSA colonization surveillance program. It is not intended to diagnose MRSA infection nor to guide or monitor treatment for MRSA infections. Test performance is not FDA  approved in patients  less than 11 years old. Performed at [facility], [place] [address], [place], [place] [address]     [facility] If 7PM-7AM, please contact night-coverage at [email or web address], Office  [phone]   09/13/2021, 4:32 PM  LOS: 1 day

## 2021-09-14 DIAGNOSIS — N184 Chronic kidney disease, stage 4 (severe): Secondary | ICD-10-CM | POA: Diagnosis not present

## 2021-09-14 DIAGNOSIS — R778 Other specified abnormalities of plasma proteins: Secondary | ICD-10-CM

## 2021-09-14 DIAGNOSIS — I248 Other forms of acute ischemic heart disease: Secondary | ICD-10-CM

## 2021-09-14 DIAGNOSIS — I169 Hypertensive crisis, unspecified: Secondary | ICD-10-CM | POA: Diagnosis not present

## 2021-09-14 LAB — CBC
HCT: 25.2 % — ABNORMAL LOW (ref 36.0–46.0)
Hemoglobin: 8.3 g/dL — ABNORMAL LOW (ref 12.0–15.0)
MCH: 28.2 pg (ref 26.0–34.0)
MCHC: 32.9 g/dL (ref 30.0–36.0)
MCV: 85.7 fL (ref 80.0–100.0)
Platelets: 495 10*3/uL — ABNORMAL HIGH (ref 150–400)
RBC: 2.94 MIL/uL — ABNORMAL LOW (ref 3.87–5.11)
RDW: 18.5 % — ABNORMAL HIGH (ref 11.5–15.5)
WBC: 9.8 10*3/uL (ref 4.0–10.5)
nRBC: 0 % (ref 0.0–0.2)

## 2021-09-14 LAB — BASIC METABOLIC PANEL
Anion gap: 10 (ref 5–15)
BUN: 54 mg/dL — ABNORMAL HIGH (ref 8–23)
CO2: 24 mmol/L (ref 22–32)
Calcium: 8.2 mg/dL — ABNORMAL LOW (ref 8.9–10.3)
Chloride: 98 mmol/L (ref 98–111)
Creatinine, Ser: 2.57 mg/dL — ABNORMAL HIGH (ref 0.44–1.00)
GFR, Estimated: 19 mL/min — ABNORMAL LOW (ref >60)
Glucose, Bld: 121 mg/dL — ABNORMAL HIGH (ref 70–99)
Potassium: 4.8 mmol/L (ref 3.5–5.1)
Sodium: 132 mmol/L — ABNORMAL LOW (ref 135–145)

## 2021-09-14 NOTE — Evaluation (Signed)
Physical Therapy Evaluation Patient Details Name: Alyssa [name] MRN: [id number] DOB: 1941-01-20 Today's Date: 09/14/2021  History of Present Illness  80 yo female who presents with SOB 2-3 days and mild L sided chest discomfort. Recently d/c'ed from [facility]. PMH: HTN, HLD, PVD, stage 4 CKD, COPD, dementia, admission 8/29-9/23 for ARF due to LLL collapse and herpes zoster infection.  Clinical Impression  Pt admitted with above diagnosis. Pt presents with L quadrant pain that is currently limiting mobility. She describes it as 8/10 at rest and 10/10 with mvmt, worsening from sit to stand and then more from stand to sit. Pt could not maintain sitting or standing due to pain and could not step LLE to be able to ambulate. Pt reports that she feels that pain begins in abdomen and goes through to back and down her LLE to posterior knee. Due to limited mobility, recommending SNF however, it pt's pain decreases expect she could go home with HHPT.  Pt currently with functional limitations due to the deficits listed below (see PT Problem List). Pt will benefit from skilled PT to increase their independence and safety with mobility to allow discharge to the venue listed below.          Recommendations for follow up therapy are one component of a multi-disciplinary discharge planning process, led by the attending physician.  Recommendations may be updated based on patient status, additional functional criteria and insurance authorization.  Follow Up Recommendations Skilled nursing-short term rehab (<3 hours/day)    Assistance Recommended at Discharge Frequent or constant Supervision/Assistance  Functional Status Assessment Patient has had a recent decline in their functional status and demonstrates the ability to make significant improvements in function in a reasonable and predictable amount of time.  Equipment Recommendations  None recommended by PT    Recommendations for Other Services OT  consult     Precautions / Restrictions Precautions Precautions: Fall Restrictions Weight Bearing Restrictions: No      Mobility  Bed Mobility Overal bed mobility: Needs Assistance Bed Mobility: Supine to Sit;Sit to Supine     Supine to sit: Min assist Sit to supine: Min assist   General bed mobility comments: min A for LE's out of bed and back into bed    Transfers Overall transfer level: Needs assistance Equipment used: 1 person hand held assist Transfers: Sit to/from Stand Sit to Stand: Min assist           General transfer comment: min A for power up to standing, pt with R lean away from pain    Ambulation/Gait               General Gait Details: pt unable to step LLE  Stairs            Wheelchair Mobility    Modified Rankin (Stroke Patients Only)       Balance Overall balance assessment: Needs assistance Sitting-balance support: Bilateral upper extremity supported;Feet supported Sitting balance-Leahy Scale: Fair Sitting balance - Comments: pt could maintain static sitting but pain worse in sitting than supine and could not tolerate for long Postural control: Right lateral lean Standing balance support: Single extremity supported;During functional activity Standing balance-Leahy Scale: Poor Standing balance comment: reliant on HH support                             Pertinent Vitals/Pain Pain Assessment: 0-10 Pain Score: 10-Worst pain ever Pain Location: L quadrant Pain Descriptors /  Indicators: Radiating Pain Intervention(s): Limited activity within patient's tolerance;Monitored during session;Premedicated before session;Repositioned    Home Living Family/patient expects to be discharged to:: Private residence Living Arrangements: Other relatives Available Help at Discharge: Friend(s);Available PRN/intermittently Type of Home: House Home Access: Level entry       Home Layout: One level Home Equipment: Conservation officer, nature  (2 wheels);Rollator (4 wheels);Tub bench Additional Comments: 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    Prior Function Prior Level of Function : Needs assist             Mobility Comments: pt reports she was ambulating independently in home with her RW since leaving [facility]. Sister helps her with home mgmt       Hand Dominance   Dominant Hand: Right    Extremity/Trunk Assessment   Upper Extremity Assessment Upper Extremity Assessment: Defer to OT evaluation    Lower Extremity Assessment Lower Extremity Assessment: Generalized weakness;LLE deficits/detail LLE Deficits / Details: pain with mvmt of LLE LLE: Unable to fully assess due to pain LLE Sensation: WNL LLE Coordination: WNL    Cervical / Trunk Assessment Cervical / Trunk Assessment: Other exceptions Cervical / Trunk Exceptions: h/o R THA and LLD  Communication   Communication: No difficulties  Cognition Arousal/Alertness: Awake/alert Behavior During Therapy: WFL for tasks assessed/performed Overall Cognitive Status: Within Functional Limits for tasks assessed                                          General Comments General comments (skin integrity, edema, etc.): VSS on 2 L O2    Exercises     Assessment/Plan    PT Assessment Patient needs continued PT services  PT Problem List Decreased strength;Decreased activity tolerance;Decreased balance;Decreased mobility;Pain       PT Treatment Interventions DME instruction;Gait training;Functional mobility training;Therapeutic activities;Therapeutic exercise;Balance training;Patient/family education    PT Goals (Current goals can be found in the Care Plan section)  Acute Rehab PT Goals Patient Stated Goal: decreased pain PT Goal Formulation: With patient Time For Goal Achievement: 09/28/21 Potential to Achieve Goals: Good    Frequency Min 3X/week   Barriers to discharge        Co-evaluation                AM-PAC PT "6 Clicks" Mobility  Outcome Measure Help needed turning from your back to your side while in a flat bed without using bedrails?: A Little Help needed moving from lying on your back to sitting on the side of a flat bed without using bedrails?: A Little Help needed moving to and from a bed to a chair (including a wheelchair)?: A Lot Help needed standing up from a chair using your arms (e.g., wheelchair or bedside chair)?: A Lot Help needed to walk in hospital room?: Total Help needed climbing 3-5 steps with a railing? : Total 6 Click Score: 12    End of Session Equipment Utilized During Treatment: Oxygen Activity Tolerance: Patient limited by pain Patient left: in bed;with call bell/phone within reach Nurse Communication: Mobility status PT Visit Diagnosis: Pain;Muscle weakness (generalized) (M62.81);Difficulty in walking, not elsewhere classified (R26.2) Pain - Right/Left: Left    Time: 2956-2130 PT Time Calculation (min) (ACUTE ONLY): 16 min   Charges:   PT Evaluation $PT Eval Moderate Complexity: [place]  Alyssa [name], [place]  Pager [phone] Office [place] 09/14/2021, 2:55 PM

## 2021-09-14 NOTE — Progress Notes (Signed)
Progress Note  [name] Date of Encounter: 09/14/2021  [facility] Cardiologist: Shelva Majestic, MD   Subjective   Reports back pain and abdominal pain.  Asking if she can have some pain medicine.  No chest pain.  Inpatient Medications    Scheduled Meds:  aspirin EC  81 mg Oral Daily   carvedilol  12.5 mg Oral BID   clopidogrel  75 mg Oral Daily   dicyclomine  20 mg Oral TID   docusate sodium  100 mg Oral BID   feeding supplement (NEPRO CARB STEADY)  237 mL Oral BID BM   fluticasone furoate-vilanterol  1 puff Inhalation Daily   And   umeclidinium bromide  1 puff Inhalation Daily   heparin injection (subcutaneous)  5,000 Units Subcutaneous Q8H   hydrOXYzine  25 mg Oral QHS   megestrol  40 mg Oral BID   multivitamin  1 tablet Oral QHS   pantoprazole  40 mg Oral Daily   sodium bicarbonate  1,300 mg Oral BID   sodium chloride flush  3 mL Intravenous Q12H   Continuous Infusions:  sodium chloride Stopped (09/12/21 1206)   PRN Meds: acetaminophen **OR** acetaminophen, albuterol, calcium carbonate (dosed in mg elemental calcium), camphor-menthol **AND** hydrOXYzine, docusate sodium, feeding supplement (NEPRO CARB STEADY), hydrALAZINE, ondansetron **OR** ondansetron (ZOFRAN) IV, oxyCODONE, sorbitol, zolpidem   Vital Signs    Vitals:   09/13/21 1940 09/13/21 2339 09/14/21 0452 09/14/21 0822  BP:  131/83 (!) 169/85 (!) 147/88  Pulse: 87 80 93 87  Resp: 17 14 16 14   Temp:  98.5 F (36.9 C) 98.5 F (36.9 C) 98.7 F (37.1 C)  TempSrc:  Oral Oral Oral  SpO2: 100% 96% 95% 100%  Weight:      Height:        Intake/Output Summary (Last 24 hours) at 09/14/2021 0846 Last data filed at 09/14/2021 6962 Gross per 24 hour  Intake 360 ml  Output 1800 ml  Net -1440 ml   Last 3 Weights 09/10/2021 08/07/2021 07/23/2021  Weight (lbs) 95 lb 0.3 oz 95 lb 97 lb  Weight (kg) 43.1 kg 43.092 kg 43.999 kg      Telemetry    Normal sinus rhythm- Personally  Reviewed  ECG    None recent  Physical Exam   GEN: Mildly uncomfortable Neck: No JVD Cardiac: RRR, no murmurs, rubs, or gallops.  Respiratory: Clear to auscultation bilaterally. GI: Soft, mildly tender,nondistended MS: No edema; No deformity. Neuro:  Nonfocal  Psych: Normal affect   Labs    High Sensitivity Troponin:   Recent Labs  Lab 09/10/21 1446 09/10/21 1635  TROPONINIHS 188* 169*     Chemistry Recent Labs  Lab 09/10/21 1446 09/10/21 1535 09/10/21 2325 09/11/21 0651 09/12/21 0052 09/13/21 0046 09/14/21 0034  NA 139   < >  --    < > 129* 130* 132*  K 2.3*   < > 2.1*   < > 3.9 4.0 4.8  CL 99  --   --    < > 96* 97* 98  CO2 18*  --   --    < > 25 25 24   GLUCOSE 72  --   --    < > 118* 148* 121*  BUN 35*  --   --    < > 49* 54* 54*  CREATININE 2.78*  --   --    < > 3.02* 2.93* 2.57*  CALCIUM 8.9  --   --    < >  7.7* 7.8* 8.2*  MG  --   --  2.7*  --   --   --   --   PROT 6.8  --   --   --   --   --   --   ALBUMIN 3.1*  --   --   --   --   --   --   AST 47*  --   --   --   --   --   --   ALT 32  --   --   --   --   --   --   ALKPHOS 111  --   --   --   --   --   --   BILITOT 1.2  --   --   --   --   --   --   GFRNONAA 17*  --   --    < > 15* 16* 19*  ANIONGAP 22*  --   --    < > 8 8 10    < > = values in this interval not displayed.    Lipids No results for input(s): CHOL, TRIG, HDL, LABVLDL, LDLCALC, CHOLHDL in the last 168 hours.  Hematology Recent Labs  Lab 09/12/21 0052 09/12/21 1004 09/13/21 0046 09/14/21 0034  WBC 7.5  --  8.8 9.8  RBC 2.54* 3.01* 2.56* 2.94*  HGB 7.2*  --  7.2* 8.3*  HCT 21.3*  --  21.7* 25.2*  MCV 83.9  --  84.8 85.7  MCH 28.3  --  28.1 28.2  MCHC 33.8  --  33.2 32.9  RDW 17.8*  --  18.0* 18.5*  PLT 470*  --  455* 495*   Thyroid No results for input(s): TSH, FREET4 in the last 168 hours.  BNP Recent Labs  Lab 09/10/21 1446  BNP 1,006.5*    DDimer No results for input(s): DDIMER in the last 168 hours.   Radiology     ECHOCARDIOGRAM COMPLETE  Result Date: 09/13/2021    ECHOCARDIOGRAM REPORT   Patient Name:   CYLAH FANNIN Date of Exam: 09/13/2021 Medical Rec #:  [id number]            Height:       63.0 in Accession #:    [id number]           Weight:       95.0 lb Date of Birth:  1941-05-03           BSA:          1.409 m Patient Age:    80 years             BP:           121/73 mmHg Patient Gender: F                    HR:           84 bpm. Exam Location:  Inpatient Procedure: 2D Echo Indications:    chest pain  History:        Patient has prior history of Echocardiogram examinations, most                 recent 05/29/2020. Chronic kidney disease. and COPD,                 Signs/Symptoms:Shortness of Breath; Risk Factors:Hypertension.  Sonographer:    Johny Chess RDCS Referring Phys: [place]  1. Left ventricular ejection fraction, by estimation, is 60 to 65%. The left ventricle has normal function. The left ventricle has no regional wall motion abnormalities. There is moderate concentric left ventricular hypertrophy. Left ventricular diastolic parameters are consistent with Grade I diastolic dysfunction (impaired relaxation).  2. Right ventricular systolic function is normal. The right ventricular size is normal. There is normal pulmonary artery systolic pressure. The estimated right ventricular systolic pressure is 19.4 mmHg.  3. The mitral valve is grossly normal. Trivial mitral valve regurgitation. No evidence of mitral stenosis.  4. The aortic valve is tricuspid. There is mild calcification of the aortic valve. There is mild thickening of the aortic valve. Aortic valve regurgitation is trivial. Aortic valve sclerosis is present, with no evidence of aortic valve stenosis.  5. The inferior vena cava is normal in size with greater than 50% respiratory variability, suggesting right atrial pressure of 3 mmHg. Comparison(s): Changes from prior study are noted. EF unchanged. LVH appears  moderate on this study. FINDINGS  Left Ventricle: Left ventricular ejection fraction, by estimation, is 60 to 65%. The left ventricle has normal function. The left ventricle has no regional wall motion abnormalities. The left ventricular internal cavity size was normal in size. There is  moderate concentric left ventricular hypertrophy. Left ventricular diastolic parameters are consistent with Grade I diastolic dysfunction (impaired relaxation). Right Ventricle: The right ventricular size is normal. No increase in right ventricular wall thickness. Right ventricular systolic function is normal. There is normal pulmonary artery systolic pressure. The tricuspid regurgitant velocity is 2.47 m/s, and  with an assumed right atrial pressure of 3 mmHg, the estimated right ventricular systolic pressure is 17.4 mmHg. Left Atrium: Left atrial size was normal in size. Right Atrium: Right atrial size was normal in size. Pericardium: Trivial pericardial effusion is present. Mitral Valve: The mitral valve is grossly normal. Trivial mitral valve regurgitation. No evidence of mitral valve stenosis. Tricuspid Valve: The tricuspid valve is grossly normal. Tricuspid valve regurgitation is trivial. No evidence of tricuspid stenosis. Aortic Valve: The aortic valve is tricuspid. There is mild calcification of the aortic valve. There is mild thickening of the aortic valve. Aortic valve regurgitation is trivial. Aortic valve sclerosis is present, with no evidence of aortic valve stenosis. Pulmonic Valve: The pulmonic valve was grossly normal. Pulmonic valve regurgitation is trivial. No evidence of pulmonic stenosis. Aorta: The aortic root and ascending aorta are structurally normal, with no evidence of dilitation. Venous: The inferior vena cava is normal in size with greater than 50% respiratory variability, suggesting right atrial pressure of 3 mmHg. IAS/Shunts: The atrial septum is grossly normal.  LEFT VENTRICLE PLAX 2D LVIDd:          3.80 cm   Diastology LVIDs:         2.80 cm   LV e' medial:    6.53 cm/s LV PW:         1.38 cm   LV E/e' medial:  12.1 LV IVS:        1.37 cm   LV e' lateral:   8.27 cm/s LVOT diam:     1.90 cm   LV E/e' lateral: 9.6 LV SV:         50 LV SV Index:   36 LVOT Area:     2.84 cm  RIGHT VENTRICLE            IVC RV S prime:     9.90 cm/s  IVC diam: 1.20 cm TAPSE (M-mode): 1.5  cm LEFT ATRIUM             Index        RIGHT ATRIUM           Index LA diam:        3.80 cm 2.70 cm/m   RA Area:     11.60 cm LA Vol (A2C):   40.0 ml 28.38 ml/m  RA Volume:   24.90 ml  17.67 ml/m LA Vol (A4C):   41.7 ml 29.59 ml/m LA Biplane Vol: 40.8 ml 28.95 ml/m  AORTIC VALVE LVOT Vmax:   105.00 cm/s LVOT Vmean:  67.700 cm/s LVOT VTI:    0.177 m  AORTA Ao Root diam: 2.80 cm Ao Asc diam:  3.70 cm MITRAL VALVE                TRICUSPID VALVE MV Area (PHT): 3.91 cm     TR Peak grad:   24.4 mmHg MV Decel Time: 194 msec     TR Vmax:        247.00 cm/s MV E velocity: 79.20 cm/s MV A velocity: 148.00 cm/s  SHUNTS MV E/A ratio:  0.54         Systemic VTI:  0.18 m                             Systemic Diam: 1.90 cm Eleonore Chiquito MD Electronically signed by Eleonore Chiquito MD Signature Date/Time: 09/13/2021/4:58:19 PM    Final     Cardiac Studies   Normal LVEF by echo  Patient Profile     80 y.o. female elevated troponin due to demand ischemia and hypertensive urgency.  Assessment & Plan    Cardiac: continue to control blood pressure.  Carvedilol has been effective.  Cannot use ACE inhibitor due to stage IV renal insufficiency.  No further ischemic work-up due to her chronic renal insufficiency and other comorbidities.  No evidence of systolic heart failure on echo.  Adequate valvular function on echo.  Volume status based on IVC appears normal.  Nurse informed about patient's request for pain meds.   Will sign off.  Please call with questions.  Will arrange f/u.   For questions or updates, please contact [place] Please  consult [email or web address] for contact info under        Signed, Larae Grooms, MD  09/14/2021, 8:46 AM

## 2021-09-14 NOTE — Progress Notes (Signed)
[facility]  PROGRESS NOTE  ANGILA WOMBLES RCV:[id number] DOB: Jul 12, 1941 DOA: 09/10/2021 PCP: System, Provider Not In   Brief HPI:   80 year old female with medical history of hypertension, hyperlipidemia, PVD, stage IV CKD, COPD, dementia and prior prolonged admission from 8/29-9 /23, acute respiratory failure associated with LLL collapse with COPD and active herpes infection.  She reports 2 to 3 days of mostly shortness of breath with mild left-sided chest discomfort. VQ scan was ordered to rule out pulmonary embolism, which turned out to be negative for PE.  BNP was elevated to 1000 but did not appear volume overloaded.  She was found to be in hypertensive urgency.  Patient was given labetalol for elevated blood pressure.    Subjective   Patient seen and examined, she was unable to work with PT today as she complained of left upper quadrant abdominal pain.  With radiation to left lower extremity.  Denies any injury or fall recently.   Assessment/Plan:     Chest pain/dyspnea -Likely in setting of hypertensive crisis -Patient was started  empirically on heparin GTT for possible PE/ACS -VQ scan was negative for PE, cardiology has ruled out ACS -Mild elevation of troponin was likely from demand ischemia -No further intervention planned per cardiology -Heparin was discontinued  -Echocardiogram was ordered, no evidence of systolic heart failure on echo. -Cardiology has signed off  Left upper quadrant abdominal pain -Unclear etiology -We will obtain CT abdomen/pelvis to rule out underlying abnormality  Hypertensive crisis -Resolved  Hypotension -Resolved -Blood pressure dropped after starting home medications  -She was started on home medications, amlodipine carvedilol and clonidine. -Dose of clonidine was changed to 0.2 mg p.o. twice daily -Patient has become hypotensive since dose of clonidine was increased -Dose of clonidine was changed to 0.1 mg p.o. twice  daily; patient continues to remain hypotensive -Amlodipine was discontinued -Continue Coreg 12.5 mg p.o. twice daily, clonidine 0.1 mg p.o. twice daily -Blood pressure is well controlled  Acute kidney injury on stage IV CKD -Patient's baseline creatinine is around 2.75-2.85 -Creatinine jumped to 3.02 -Creatinine has now improved to 2.57 -Follow renal function in a.m.  Peripheral vascular disease -Continue aspirin, Plavix  Hypokalemia -Patient presents with potassium of less than 2 -Replete  Dementia -Continue Aricept, duloxetine, hydroxyzine  Malnutrition -BMI 16.83 kg per metered square -Likely from poor p.o. intake -Dietitian consulted  Anemia of chronic disease -Hemoglobin was low at 7.2 today -Today hemoglobin improved to 8.3 -Serum iron 60, saturation 27%, B12 1802 -FOBT ordered   Medications     aspirin EC  81 mg Oral Daily   carvedilol  12.5 mg Oral BID   clopidogrel  75 mg Oral Daily   dicyclomine  20 mg Oral TID   docusate sodium  100 mg Oral BID   feeding supplement (NEPRO CARB STEADY)  237 mL Oral BID BM   fluticasone furoate-vilanterol  1 puff Inhalation Daily   And   umeclidinium bromide  1 puff Inhalation Daily   heparin injection (subcutaneous)  5,000 Units Subcutaneous Q8H   hydrOXYzine  25 mg Oral QHS   megestrol  40 mg Oral BID   multivitamin  1 tablet Oral QHS   pantoprazole  40 mg Oral Daily   sodium bicarbonate  1,300 mg Oral BID   sodium chloride flush  3 mL Intravenous Q12H     Data Reviewed:   CBG:  No results for input(s): GLUCAP in the last 168 hours.  SpO2: 99 % O2 Flow Rate (L/min):  3 L/min    Vitals:   09/14/21 0452 09/14/21 0822 09/14/21 0906 09/14/21 1333  BP: (!) 169/85 (!) 147/88  127/81  Pulse: 93 87  82  Resp: 16 14  16   Temp: 98.5 F (36.9 C) 98.7 F (37.1 C)  98.1 F (36.7 C)  TempSrc: Oral Oral  Oral  SpO2: 95% 100% 98% 99%  Weight:      Height:         Intake/Output Summary (Last 24 hours) at  09/14/2021 1454 Last data filed at 09/14/2021 0845 Gross per 24 hour  Intake 357 ml  Output 850 ml  Net -493 ml    11/26 1901 - 11/28 0700 In: 480 [P.O.:480] Out: 950 [Urine:950]  Filed Weights   09/10/21 1431  Weight: 43.1 kg    Data Reviewed: Basic Metabolic Panel: Recent Labs  Lab 09/10/21 1446 09/10/21 1535 09/10/21 2325 09/11/21 0651 09/11/21 0914 09/12/21 0052 09/13/21 0046 09/14/21 0034  NA 139 139  --  134*  --  129* 130* 132*  K 2.3* <2.0* 2.1* 3.7  --  3.9 4.0 4.8  CL 99  --   --  100  --  96* 97* 98  CO2 18*  --   --  24  --  25 25 24   GLUCOSE 72  --   --  72  --  118* 148* 121*  BUN 35*  --   --  40*  --  49* 54* 54*  CREATININE 2.78*  --   --  2.53*  --  3.02* 2.93* 2.57*  CALCIUM 8.9  --   --  7.8*  --  7.7* 7.8* 8.2*  MG  --   --  2.7*  --   --   --   --   --   PHOS  --   --   --   --  2.8  --   --   --    Liver Function Tests: Recent Labs  Lab 09/10/21 1446  AST 47*  ALT 32  ALKPHOS 111  BILITOT 1.2  PROT 6.8  ALBUMIN 3.1*   No results for input(s): LIPASE, AMYLASE in the last 168 hours. No results for input(s): AMMONIA in the last 168 hours. CBC: Recent Labs  Lab 09/10/21 1446 09/10/21 1535 09/11/21 0104 09/12/21 0052 09/13/21 0046 09/14/21 0034  WBC 11.1*  --  8.3 7.5 8.8 9.8  NEUTROABS 7.6  --   --   --   --   --   HGB 9.5* 8.5* 7.4* 7.2* 7.2* 8.3*  HCT 29.3* 25.0* 23.4* 21.3* 21.7* 25.2*  MCV 84.7  --  86.0 83.9 84.8 85.7  PLT 662*  --  509* 470* 455* 495*   Cardiac Enzymes: No results for input(s): CKTOTAL, CKMB, CKMBINDEX, TROPONINI in the last 168 hours. BNP (last 3 results) Recent Labs    09/10/21 1446  BNP 1,006.5*    ProBNP (last 3 results) No results for input(s): PROBNP in the last 8760 hours.  CBG: No results for input(s): GLUCAP in the last 168 hours.     Radiology Reports  ECHOCARDIOGRAM COMPLETE  Result Date: 09/13/2021    ECHOCARDIOGRAM REPORT   Patient Name:   Alyssa [name] Date of  Exam: 09/13/2021 Medical Rec #:  [id number]            Height:       63.0 in Accession #:    [id number]           Weight:  95.0 lb Date of Birth:  08-28-1941           BSA:          1.409 m Patient Age:    60 years             BP:           121/73 mmHg Patient Gender: F                    HR:           84 bpm. Exam Location:  Inpatient Procedure: 2D Echo Indications:    chest pain  History:        Patient has prior history of Echocardiogram examinations, most                 recent 05/29/2020. Chronic kidney disease. and COPD,                 Signs/Symptoms:Shortness of Breath; Risk Factors:Hypertension.  Sonographer:    Johny Chess RDCS Referring Phys: Oval  1. Left ventricular ejection fraction, by estimation, is 60 to 65%. The left ventricle has normal function. The left ventricle has no regional wall motion abnormalities. There is moderate concentric left ventricular hypertrophy. Left ventricular diastolic parameters are consistent with Grade I diastolic dysfunction (impaired relaxation).  2. Right ventricular systolic function is normal. The right ventricular size is normal. There is normal pulmonary artery systolic pressure. The estimated right ventricular systolic pressure is 38.1 mmHg.  3. The mitral valve is grossly normal. Trivial mitral valve regurgitation. No evidence of mitral stenosis.  4. The aortic valve is tricuspid. There is mild calcification of the aortic valve. There is mild thickening of the aortic valve. Aortic valve regurgitation is trivial. Aortic valve sclerosis is present, with no evidence of aortic valve stenosis.  5. The inferior vena cava is normal in size with greater than 50% respiratory variability, suggesting right atrial pressure of 3 mmHg. Comparison(s): Changes from prior study are noted. EF unchanged. LVH appears moderate on this study. FINDINGS  Left Ventricle: Left ventricular ejection fraction, by estimation, is 60 to 65%. The left ventricle  has normal function. The left ventricle has no regional wall motion abnormalities. The left ventricular internal cavity size was normal in size. There is  moderate concentric left ventricular hypertrophy. Left ventricular diastolic parameters are consistent with Grade I diastolic dysfunction (impaired relaxation). Right Ventricle: The right ventricular size is normal. No increase in right ventricular wall thickness. Right ventricular systolic function is normal. There is normal pulmonary artery systolic pressure. The tricuspid regurgitant velocity is 2.47 m/s, and  with an assumed right atrial pressure of 3 mmHg, the estimated right ventricular systolic pressure is 01.7 mmHg. Left Atrium: Left atrial size was normal in size. Right Atrium: Right atrial size was normal in size. Pericardium: Trivial pericardial effusion is present. Mitral Valve: The mitral valve is grossly normal. Trivial mitral valve regurgitation. No evidence of mitral valve stenosis. Tricuspid Valve: The tricuspid valve is grossly normal. Tricuspid valve regurgitation is trivial. No evidence of tricuspid stenosis. Aortic Valve: The aortic valve is tricuspid. There is mild calcification of the aortic valve. There is mild thickening of the aortic valve. Aortic valve regurgitation is trivial. Aortic valve sclerosis is present, with no evidence of aortic valve stenosis. Pulmonic Valve: The pulmonic valve was grossly normal. Pulmonic valve regurgitation is trivial. No evidence of pulmonic stenosis. Aorta: The aortic root and ascending aorta are structurally  normal, with no evidence of dilitation. Venous: The inferior vena cava is normal in size with greater than 50% respiratory variability, suggesting right atrial pressure of 3 mmHg. IAS/Shunts: The atrial septum is grossly normal.  LEFT VENTRICLE PLAX 2D LVIDd:         3.80 cm   Diastology LVIDs:         2.80 cm   LV e' medial:    6.53 cm/s LV PW:         1.38 cm   LV E/e' medial:  12.1 LV IVS:         1.37 cm   LV e' lateral:   8.27 cm/s LVOT diam:     1.90 cm   LV E/e' lateral: 9.6 LV SV:         50 LV SV Index:   36 LVOT Area:     2.84 cm  RIGHT VENTRICLE            IVC RV S prime:     9.90 cm/s  IVC diam: 1.20 cm TAPSE (M-mode): 1.5 cm LEFT ATRIUM             Index        RIGHT ATRIUM           Index LA diam:        3.80 cm 2.70 cm/m   RA Area:     11.60 cm LA Vol (A2C):   40.0 ml 28.38 ml/m  RA Volume:   24.90 ml  17.67 ml/m LA Vol (A4C):   41.7 ml 29.59 ml/m LA Biplane Vol: 40.8 ml 28.95 ml/m  AORTIC VALVE LVOT Vmax:   105.00 cm/s LVOT Vmean:  67.700 cm/s LVOT VTI:    0.177 m  AORTA Ao Root diam: 2.80 cm Ao Asc diam:  3.70 cm MITRAL VALVE                TRICUSPID VALVE MV Area (PHT): 3.91 cm     TR Peak grad:   24.4 mmHg MV Decel Time: 194 msec     TR Vmax:        247.00 cm/s MV E velocity: 79.20 cm/s MV A velocity: 148.00 cm/s  SHUNTS MV E/A ratio:  0.54         Systemic VTI:  0.18 m                             Systemic Diam: 1.90 cm Eleonore Chiquito MD Electronically signed by Eleonore Chiquito MD Signature Date/Time: 09/13/2021/4:58:19 PM    Final        Antibiotics: Anti-infectives (From admission, onward)    None         DVT prophylaxis: Heparin  Code Status: DNR  Family Communication: No family at bedside   Consultants: Cardiology  Procedures:     Objective    Physical Examination:  General-appears in no acute distress Heart-S1-S2, regular, no murmur auscultated Lungs-clear to auscultation bilaterally, no wheezing or crackles auscultated Abdomen-soft, tenderness in left upper quadrant, mild guarding Extremities-no edema in the lower extremities Neuro-alert, oriented x3, no focal deficit noted  Status is: Inpatient  Dispo: The patient is from: Home              Anticipated d/c is to: Home              Anticipated d/c date is: 09/16/2021  Patient currently not stable for discharge  Barrier to discharge-ongoing work-up for chest pain,  hypertensive urgency  COVID-19 Labs  Recent Labs    09/12/21 1004  FERRITIN 897*    Lab Results  Component Value Date   SARSCOV2NAA NEGATIVE 09/10/2021   [place] NEGATIVE 07/10/2021   [place] NEGATIVE 06/15/2021   [place] NEGATIVE 12/30/2020            Recent Results (from the past 240 hour(s))  Resp Panel by RT-PCR (Flu A&B, Covid) Nasopharyngeal Swab     Status: None   Collection Time: 09/10/21  2:46 PM   Specimen: Nasopharyngeal Swab; Nasopharyngeal(NP) swabs in vial transport medium  Result Value Ref Range Status   SARS Coronavirus 2 by RT PCR NEGATIVE NEGATIVE Final    Comment: (NOTE) SARS-CoV-2 target nucleic acids are NOT DETECTED.  The SARS-CoV-2 RNA is generally detectable in upper respiratory specimens during the acute phase of infection. The lowest concentration of SARS-CoV-2 viral copies this assay can detect is 138 copies/mL. A negative result does not preclude SARS-Cov-2 infection and should not be used as the sole basis for treatment or other patient management decisions. A negative result may occur with  improper specimen collection/handling, submission of specimen other than nasopharyngeal swab, presence of viral mutation(s) within the areas targeted by this assay, and inadequate number of viral copies(<138 copies/mL). A negative result must be combined with clinical observations, patient history, and epidemiological information. The expected result is Negative.  Fact Sheet for Patients:  EntrepreneurPulse.com.au  Fact Sheet for Healthcare Providers:  IncredibleEmployment.be  This test is no t yet approved or cleared by the [place] FDA and  has been authorized for detection and/or diagnosis of SARS-CoV-2 by FDA under an Emergency Use Authorization (EUA). This EUA will remain  in effect (meaning this test can be used) for the duration of the COVID-19 declaration under Section 564(b)(1) of the  Act, 21 U.S.C.section 360bbb-3(b)(1), unless the authorization is terminated  or revoked sooner.       Influenza A by PCR NEGATIVE NEGATIVE Final   Influenza B by PCR NEGATIVE NEGATIVE Final    Comment: (NOTE) The Xpert Xpress SARS-CoV-2/FLU/RSV plus assay is intended as an aid in the diagnosis of influenza from Nasopharyngeal swab specimens and should not be used as a sole basis for treatment. Nasal washings and aspirates are unacceptable for Xpert Xpress SARS-CoV-2/FLU/RSV testing.  Fact Sheet for Patients: EntrepreneurPulse.com.au  Fact Sheet for Healthcare Providers: IncredibleEmployment.be  This test is not yet approved or cleared by the [place] FDA and has been authorized for detection and/or diagnosis of SARS-CoV-2 by FDA under an Emergency Use Authorization (EUA). This EUA will remain in effect (meaning this test can be used) for the duration of the COVID-19 declaration under Section 564(b)(1) of the Act, 21 U.S.C. section 360bbb-3(b)(1), unless the authorization is terminated or revoked.  Performed at [facility], [place] [address], [place], [place] [address]   MRSA Next Gen by PCR, Nasal     Status: None   Collection Time: 09/11/21  4:11 AM   Specimen: Nasal Mucosa; Nasal Swab  Result Value Ref Range Status   MRSA by PCR Next Gen NOT DETECTED NOT DETECTED Final    Comment: (NOTE) The GeneXpert MRSA Assay (FDA approved for NASAL specimens only), is one component of a comprehensive MRSA colonization surveillance program. It is not intended to diagnose MRSA infection nor to guide or monitor treatment for MRSA infections. Test performance is not FDA approved in patients  less than 45 years old. Performed at [facility], [place] [address] [place], [place] [address]     [facility] If 7PM-7AM, please contact night-coverage at [email or web address], Office  [phone]   09/14/2021, 2:54 PM  LOS:  2 days

## 2021-09-15 ENCOUNTER — Inpatient Hospital Stay (HOSPITAL_COMMUNITY): Payer: Medicare HMO

## 2021-09-15 DIAGNOSIS — Z79899 Other long term (current) drug therapy: Secondary | ICD-10-CM

## 2021-09-15 DIAGNOSIS — I724 Aneurysm of artery of lower extremity: Secondary | ICD-10-CM

## 2021-09-15 DIAGNOSIS — N184 Chronic kidney disease, stage 4 (severe): Secondary | ICD-10-CM

## 2021-09-15 DIAGNOSIS — J449 Chronic obstructive pulmonary disease, unspecified: Secondary | ICD-10-CM

## 2021-09-15 DIAGNOSIS — T82898A Other specified complication of vascular prosthetic devices, implants and grafts, initial encounter: Secondary | ICD-10-CM

## 2021-09-15 DIAGNOSIS — I161 Hypertensive emergency: Secondary | ICD-10-CM | POA: Diagnosis not present

## 2021-09-15 DIAGNOSIS — Z7982 Long term (current) use of aspirin: Secondary | ICD-10-CM

## 2021-09-15 DIAGNOSIS — I129 Hypertensive chronic kidney disease with stage 1 through stage 4 chronic kidney disease, or unspecified chronic kidney disease: Secondary | ICD-10-CM

## 2021-09-15 DIAGNOSIS — Z7902 Long term (current) use of antithrombotics/antiplatelets: Secondary | ICD-10-CM

## 2021-09-15 DIAGNOSIS — I169 Hypertensive crisis, unspecified: Secondary | ICD-10-CM | POA: Diagnosis not present

## 2021-09-15 DIAGNOSIS — I248 Other forms of acute ischemic heart disease: Secondary | ICD-10-CM | POA: Diagnosis not present

## 2021-09-15 DIAGNOSIS — F0390 Unspecified dementia without behavioral disturbance: Secondary | ICD-10-CM

## 2021-09-15 LAB — COMPREHENSIVE METABOLIC PANEL
ALT: 19 U/L (ref 0–44)
AST: 21 U/L (ref 15–41)
Albumin: 2.2 g/dL — ABNORMAL LOW (ref 3.5–5.0)
Alkaline Phosphatase: 76 U/L (ref 38–126)
Anion gap: 6 (ref 5–15)
BUN: 49 mg/dL — ABNORMAL HIGH (ref 8–23)
CO2: 29 mmol/L (ref 22–32)
Calcium: 8.6 mg/dL — ABNORMAL LOW (ref 8.9–10.3)
Chloride: 103 mmol/L (ref 98–111)
Creatinine, Ser: 2.41 mg/dL — ABNORMAL HIGH (ref 0.44–1.00)
GFR, Estimated: 20 mL/min — ABNORMAL LOW (ref >60)
Glucose, Bld: 117 mg/dL — ABNORMAL HIGH (ref 70–99)
Potassium: 4.7 mmol/L (ref 3.5–5.1)
Sodium: 138 mmol/L (ref 135–145)
Total Bilirubin: 0.2 mg/dL — ABNORMAL LOW (ref 0.3–1.2)
Total Protein: 5.4 g/dL — ABNORMAL LOW (ref 6.5–8.1)

## 2021-09-15 LAB — HEMOGLOBIN AND HEMATOCRIT, BLOOD
HCT: 20.4 % — ABNORMAL LOW (ref 36.0–46.0)
HCT: 26 % — ABNORMAL LOW (ref 36.0–46.0)
Hemoglobin: 6.8 g/dL — CL (ref 12.0–15.0)
Hemoglobin: 8.7 g/dL — ABNORMAL LOW (ref 12.0–15.0)

## 2021-09-15 LAB — CBC
HCT: 20.9 % — ABNORMAL LOW (ref 36.0–46.0)
Hemoglobin: 6.9 g/dL — CL (ref 12.0–15.0)
MCH: 27.9 pg (ref 26.0–34.0)
MCHC: 33 g/dL (ref 30.0–36.0)
MCV: 84.6 fL (ref 80.0–100.0)
Platelets: 476 10*3/uL — ABNORMAL HIGH (ref 150–400)
RBC: 2.47 MIL/uL — ABNORMAL LOW (ref 3.87–5.11)
RDW: 18.4 % — ABNORMAL HIGH (ref 11.5–15.5)
WBC: 10.7 10*3/uL — ABNORMAL HIGH (ref 4.0–10.5)
nRBC: 0 % (ref 0.0–0.2)

## 2021-09-15 LAB — PREPARE RBC (CROSSMATCH)

## 2021-09-15 MED ORDER — CYCLOBENZAPRINE HCL 10 MG PO TABS
ORAL_TABLET | ORAL | Status: AC
  Filled 2021-09-15: qty 1

## 2021-09-15 MED ORDER — IOHEXOL 300 MG/ML  SOLN
25.0000 mL | Freq: Once | INTRAMUSCULAR | Status: DC | PRN
  Administered 2021-09-15: 25 mL via ORAL

## 2021-09-15 MED ORDER — AMLODIPINE BESYLATE 5 MG PO TABS
ORAL_TABLET | ORAL | Status: AC
  Filled 2021-09-15: qty 1

## 2021-09-15 MED ORDER — CYCLOBENZAPRINE HCL 10 MG PO TABS
ORAL_TABLET | ORAL | Status: AC
  Administered 2021-09-15: 5 mg via ORAL
  Filled 2021-09-15: qty 1

## 2021-09-15 MED ORDER — AMLODIPINE BESYLATE 5 MG PO TABS
5.0000 mg | ORAL_TABLET | Freq: Every day | ORAL | Status: DC
  Administered 2021-09-15 – 2021-09-16 (×2): 5 mg via ORAL
  Filled 2021-09-15: qty 1

## 2021-09-15 MED ORDER — SODIUM CHLORIDE 0.9% IV SOLUTION: Freq: Once | INTRAVENOUS | Status: AC

## 2021-09-15 MED ORDER — CYCLOBENZAPRINE HCL 10 MG PO TABS
5.0000 mg | ORAL_TABLET | Freq: Three times a day (TID) | ORAL | Status: AC
  Administered 2021-09-15 – 2021-09-16 (×2): 5 mg via ORAL
  Filled 2021-09-15: qty 1

## 2021-09-15 MED ORDER — CYCLOBENZAPRINE HCL 10 MG PO TABS: 5.0000 mg | ORAL_TABLET | Freq: Three times a day (TID) | ORAL | Status: DC

## 2021-09-15 NOTE — Plan of Care (Signed)

## 2021-09-15 NOTE — NC FL2 (Signed)
[place] LEVEL OF CARE SCREENING TOOL     IDENTIFICATION  [name] Birthdate: Sep 22, 1941 Sex: female Admission Date (Current Location): 09/10/2021  [facility] and [place] Number:  Herbalist and Address:  [facility], [place] [address], [place], [place] [address]      Provider Number: [id number]  Attending Physician Name and Address:  Oswald Hillock, MD  Relative Name and Phone Number:  Donyell, Carrell [phone])   [phone]    Current Level of Care: Hospital Recommended Level of Care: [place] Prior Approval Number:    Date Approved/Denied:   PASRR Number: [id number]  Discharge Plan: SNF    Current Diagnoses: Patient Active Problem List   Diagnosis Date Noted   Hypertensive crisis 09/10/2021   Rectal bleeding    Rectal pain    Malnutrition of moderate degree 06/17/2021   Weight loss 06/16/2021   Zoster 06/16/2021   Collapsed lung    COPD  GOLD 0/ still smoking  09/26/2020   Cigarette smoker 09/26/2020   Shortness of breath 07/06/2020   Essential hypertension 07/06/2020   Abdominal pain 07/06/2020   Elevated INR 07/06/2020   Acute respiratory failure due to COVID-19 ([place]) 06/02/2020   Acute renal failure superimposed on chronic kidney disease ([place]) 02/06/2020   Leukocytosis 02/06/2020   Thrombocytosis 02/06/2020   Palliative care encounter    Constipation 01/05/2020   Protein-calorie malnutrition, severe ([place]) 01/04/2020   Acute on chronic renal failure ([place]) 01/04/2020   Hyperkalemia 01/03/2020   AKI (acute kidney injury) ([place]) 01/03/2020   Acute respiratory failure with hypoxia ([place]) 11/01/2019   UTI (urinary tract infection) 11/01/2019   CAP (community acquired pneumonia) 11/01/2019   Acute cystitis without hematuria    Encephalopathy 09/18/2019   SAH (subarachnoid hemorrhage) ([place]) 09/18/2019   Dementia ([place]) 54/62/7035   Metabolic acidosis 00/93/8182   ARF (acute  renal failure) ([place]) 09/17/2019   Acute encephalopathy 09/17/2019   Abnormal CT scan, colon    Diarrhea    Diverticulitis 06/07/2019   Depression with anxiety 06/07/2019   DNR (do not resuscitate) 03/23/2019   Volume overload 03/23/2019   Edema 03/23/2019   Acute renal failure (ARF) ([place]) 03/08/2019   Hypokalemia 03/08/2019   Palliative care by specialist    Goals of care, counseling/discussion    Altered mental status    Encephalopathy, toxic 12/24/2018   Aspiration pneumonia of both lower lobes due to gastric secretions ([place]) 12/24/2018   Non-ST elevation (NSTEMI) myocardial infarction [facility])    Chest pain    Dyspnea on exertion    Elevated troponin level 12/09/2018   Chronic migraine 03/27/2018   Lumbar radiculopathy 03/27/2018   Gait abnormality 03/27/2018   Hypertensive urgency 12/28/2016   CKD (chronic kidney disease) stage 4, GFR 15-29 ml/min (HCC) 12/28/2016   Anemia 12/28/2016   Chronic pain syndrome 12/28/2016   Benzodiazepine withdrawal without complication ([place]) 99/37/1696   Chronic right shoulder pain 12/06/2016   Fall 09/17/2016   Fracture of humeral head, closed, right, initial encounter 09/17/2016   Rib fractures 09/17/2016   Multiple falls 09/17/2016   Severe muscle deconditioning 09/17/2016   Failure to thrive in adult 09/17/2016   Melena 04/25/2016   Transient hypotension 04/25/2016   Anxiety state 04/25/2016   Tobacco abuse 04/25/2016   Hyperlipidemia 04/25/2016   Syncope 04/24/2016   Near syncope 04/24/2016   Gait difficulty 01/12/2016   Foot drop, left 01/12/2016   Severe recurrent major depression without psychotic features (  [place]) 08/28/2015   Spondylolisthesis at L4-L5 level 07/30/2015   PVD (peripheral vascular disease) ([place]) 07/29/2014   Weakness-Bilateral arm/leg 07/29/2014   Numbness-left leg 07/29/2014   Swelling of limb-Legs 07/29/2014   Atherosclerosis of native arteries of the extremities with intermittent claudication 09/30/2011     Orientation RESPIRATION BLADDER Height & Weight     Self, Time, Situation, Place  O2 (2L Nasal Cannula) Incontinent, External catheter Weight: 95 lb 0.3 oz (43.1 kg) Height:  5\' 3"  (160 cm)  BEHAVIORAL SYMPTOMS/MOOD NEUROLOGICAL BOWEL NUTRITION STATUS      Continent Diet (See Dc Summary)  AMBULATORY STATUS COMMUNICATION OF NEEDS Skin   Limited Assist Verbally Normal                       Personal Care Assistance Level of Assistance  Bathing, Dressing, Feeding Bathing Assistance: Limited assistance Feeding assistance: Independent Dressing Assistance: Limited assistance     Functional Limitations Info  Sight, Hearing, Speech Sight Info: Adequate Hearing Info: Adequate Speech Info: Adequate    SPECIAL CARE FACTORS FREQUENCY  PT (By licensed PT), OT (By licensed OT)     PT Frequency: 5x a week OT Frequency: 5x a week            Contractures Contractures Info: Not present    Additional Factors Info  Code Status, Allergies Code Status Info: DNR Allergies Info: NKA           Current Medications (09/15/2021):  This is the current hospital active medication list Current Facility-Administered Medications  Medication Dose Route Frequency Provider Last Rate Last Admin   0.9 %  sodium chloride infusion (Manually program via Guardrails IV Fluids)   Intravenous Once Oswald Hillock, MD       acetaminophen (TYLENOL) tablet 650 mg  650 mg Oral Q6H PRN Karmen Bongo, MD   650 mg at 09/15/21 6644   Or   acetaminophen (TYLENOL) suppository 650 mg  650 mg Rectal Q6H PRN Karmen Bongo, MD       albuterol (PROVENTIL) (2.5 MG/3ML) 0.083% nebulizer solution 3 mL  3 mL Inhalation Q4H PRN Karmen Bongo, MD       amLODipine (NORVASC) tablet 5 mg  5 mg Oral Daily Oswald Hillock, MD   5 mg at 09/15/21 1003   aspirin EC tablet 81 mg  81 mg Oral Daily Karmen Bongo, MD   81 mg at 09/15/21 0347   calcium carbonate (dosed in mg elemental calcium) suspension 500 mg of elemental  calcium  500 mg of elemental calcium Oral Q6H PRN Karmen Bongo, MD       camphor-menthol [facility]) lotion 1 application  1 application Topical Q2V PRN Karmen Bongo, MD       And   hydrOXYzine (ATARAX/VISTARIL) tablet 25 mg  25 mg Oral Q8H PRN Karmen Bongo, MD       carvedilol (COREG) tablet 12.5 mg  12.5 mg Oral BID Karmen Bongo, MD   12.5 mg at 09/15/21 9563   clopidogrel (PLAVIX) tablet 75 mg  75 mg Oral Daily Karmen Bongo, MD   75 mg at 09/15/21 8756   dicyclomine (BENTYL) tablet 20 mg  20 mg Oral TID Karmen Bongo, MD   20 mg at 09/15/21 0957   docusate sodium (COLACE) capsule 100 mg  100 mg Oral BID Karmen Bongo, MD   100 mg at 09/15/21 0958   docusate sodium (ENEMEEZ) enema 283 mg  1 enema Rectal PRN Karmen Bongo, MD  feeding supplement (NEPRO CARB STEADY) liquid 237 mL  237 mL Oral TID PRN Karmen Bongo, MD       feeding supplement (NEPRO CARB STEADY) liquid 237 mL  237 mL Oral BID BM Karmen Bongo, MD   237 mL at 09/14/21 1332   fluticasone furoate-vilanterol (BREO ELLIPTA) 100-25 MCG/ACT 1 puff  1 puff Inhalation Daily Karmen Bongo, MD   1 puff at 09/15/21 0839   And   umeclidinium bromide (INCRUSE ELLIPTA) 62.5 MCG/ACT 1 puff  1 puff Inhalation Daily Karmen Bongo, MD   1 puff at 09/15/21 0839   heparin injection 5,000 Units  5,000 Units Subcutaneous Q8H Oswald Hillock, MD   5,000 Units at 09/15/21 2119   hydrALAZINE (APRESOLINE) injection 5 mg  5 mg Intravenous Q4H PRN Karmen Bongo, MD   5 mg at 09/10/21 2018   hydrOXYzine (ATARAX/VISTARIL) tablet 25 mg  25 mg Oral Ivery Quale, MD   25 mg at 09/14/21 2244   iohexol (OMNIPAQUE) 300 MG/ML solution 25 mL  25 mL Oral Once PRN Oswald Hillock, MD   25 mL at 09/15/21 0526   megestrol (MEGACE) tablet 40 mg  40 mg Oral BID Karmen Bongo, MD   40 mg at 09/15/21 4174   multivitamin (RENA-VIT) tablet 1 tablet  1 tablet Oral QHS Oswald Hillock, MD   1 tablet at 09/14/21 2245   ondansetron (ZOFRAN) tablet  4 mg  4 mg Oral Q6H PRN Karmen Bongo, MD       Or   ondansetron [facility]) injection 4 mg  4 mg Intravenous Q6H PRN Karmen Bongo, MD   4 mg at 09/14/21 1013   oxyCODONE (Oxy IR/ROXICODONE) immediate release tablet 5 mg  5 mg Oral Q4H PRN Karmen Bongo, MD   5 mg at 09/15/21 0956   pantoprazole (PROTONIX) EC tablet 40 mg  40 mg Oral Daily Karmen Bongo, MD   40 mg at 09/15/21 0814   sodium bicarbonate tablet 1,300 mg  1,300 mg Oral BID Karmen Bongo, MD   1,300 mg at 09/15/21 4818   sodium chloride 0.9 % bolus 250 mL  250 mL Intravenous Once Oswald Hillock, MD   Stopped at 09/12/21 1206   sodium chloride flush (NS) 0.9 % injection 3 mL  3 mL Intravenous Lillia Mountain, MD   3 mL at 09/15/21 1000   sorbitol 70 % solution 30 mL  30 mL Oral PRN Karmen Bongo, MD       zolpidem Lorrin Mais) tablet 5 mg  5 mg Oral QHS PRN Karmen Bongo, MD   5 mg at 09/13/21 2133     Discharge Medications: Please see discharge summary for a list of discharge medications.  Relevant Imaging Results:  Relevant Lab Results:   Additional Information ss#[id number]. COVID vaccinations: 2/23, 3/16, 10/12/20  Reece Agar, LCSWA

## 2021-09-15 NOTE — Progress Notes (Signed)
Date and time results received: 09/15/21 -0545    Test: hbg Critical Value: 6.9  Name of Provider Notified: Dr. Bridgett Larsson   Orders Received? Or Actions Taken?: Actions Taken: Repeat H & H

## 2021-09-15 NOTE — Consult Note (Addendum)
[name] NOTE   MRN : [id number]  Reason for Consult: right groin hematoma or pseudoaneurysm Referring Physician: Dr. Eleonore Chiquito  History of Present Illness: 80 y/o female with history of left to right fem-fem bypass graft in November 2011 by Dr. Oneida Alar.  She was last seen in our office 06/27/21 by Dr. Stanford Breed.  He suggested repair and made a f/u visit that the patient missed.    CT shows Lesion in the right groin.  Patient states the swollen "golf ball" area has been present for > 2 years.  She ambulates with a walker.  She denise rest pain, claudication or non healing wounds on B LE.  apparently incorporating the graft anastomosis measures 5.5 x 4.8 cm today compared to 4.8 x 4.1 cm previously. Imaging features today suggest hematoma or pseudoaneurysm.  We have been consulted.     Patient presented for 2-3 days of SOB.  She was found to be in hypertensive urgency.  Patient was given labetalol for elevated blood pressure.  She was previously admitted from 8/29-9/23 acute respiratory failure associated with LLL collapse with COPD and active herpes zoster infection.  She was worked up for PE.  VQ scan was negative.   Past medical history includes:  HTN; HLD; PVD; stage 4 CKD; COPD, dementia.  She is on ASA and Plavix.       Current Facility-Administered Medications  Medication Dose Route Frequency Provider Last Rate Last Admin   0.9 %  sodium chloride infusion (Manually program via Guardrails IV Fluids)   Intravenous Once Oswald Hillock, MD       acetaminophen (TYLENOL) tablet 650 mg  650 mg Oral Q6H PRN Karmen Bongo, MD   650 mg at 09/15/21 5176   Or   acetaminophen (TYLENOL) suppository 650 mg  650 mg Rectal Q6H PRN Karmen Bongo, MD       albuterol (PROVENTIL) (2.5 MG/3ML) 0.083% nebulizer solution 3 mL  3 mL Inhalation Q4H PRN Karmen Bongo, MD       amLODipine (NORVASC) tablet 5 mg  5 mg Oral Daily Oswald Hillock, MD   5 mg at 09/15/21 1003    aspirin EC tablet 81 mg  81 mg Oral Daily Karmen Bongo, MD   81 mg at 09/15/21 1607   calcium carbonate (dosed in mg elemental calcium) suspension 500 mg of elemental calcium  500 mg of elemental calcium Oral Q6H PRN Karmen Bongo, MD       camphor-menthol [facility]) lotion 1 application  1 application Topical P7T PRN Karmen Bongo, MD       And   hydrOXYzine (ATARAX/VISTARIL) tablet 25 mg  25 mg Oral Q8H PRN Karmen Bongo, MD       carvedilol (COREG) tablet 12.5 mg  12.5 mg Oral BID Karmen Bongo, MD   12.5 mg at 09/15/21 0626   clopidogrel (PLAVIX) tablet 75 mg  75 mg Oral Daily Karmen Bongo, MD   75 mg at 09/15/21 9485   dicyclomine (BENTYL) tablet 20 mg  20 mg Oral TID Karmen Bongo, MD   20 mg at 09/15/21 0957   docusate sodium (COLACE) capsule 100 mg  100 mg Oral BID Karmen Bongo, MD   100 mg at 09/15/21 0958   docusate sodium (ENEMEEZ) enema 283 mg  1 enema Rectal PRN Karmen Bongo, MD       feeding supplement (NEPRO CARB STEADY) liquid 237 mL  237 mL Oral TID PRN Karmen Bongo, MD  feeding supplement (NEPRO CARB STEADY) liquid 237 mL  237 mL Oral BID BM Karmen Bongo, MD   237 mL at 09/14/21 1332   fluticasone furoate-vilanterol (BREO ELLIPTA) 100-25 MCG/ACT 1 puff  1 puff Inhalation Daily Karmen Bongo, MD   1 puff at 09/15/21 0839   And   umeclidinium bromide (INCRUSE ELLIPTA) 62.5 MCG/ACT 1 puff  1 puff Inhalation Daily Karmen Bongo, MD   1 puff at 09/15/21 0839   heparin injection 5,000 Units  5,000 Units Subcutaneous Q8H Oswald Hillock, MD   5,000 Units at 09/15/21 8144   hydrALAZINE (APRESOLINE) injection 5 mg  5 mg Intravenous Q4H PRN Karmen Bongo, MD   5 mg at 09/10/21 2018   hydrOXYzine (ATARAX/VISTARIL) tablet 25 mg  25 mg Oral Ivery Quale, MD   25 mg at 09/14/21 2244   iohexol (OMNIPAQUE) 300 MG/ML solution 25 mL  25 mL Oral Once PRN Oswald Hillock, MD   25 mL at 09/15/21 0526   megestrol (MEGACE) tablet 40 mg  40 mg Oral BID Karmen Bongo, MD   40 mg at 09/15/21 8185   multivitamin (RENA-VIT) tablet 1 tablet  1 tablet Oral QHS Oswald Hillock, MD   1 tablet at 09/14/21 2245   ondansetron (ZOFRAN) tablet 4 mg  4 mg Oral Q6H PRN Karmen Bongo, MD       Or   ondansetron [facility]) injection 4 mg  4 mg Intravenous Q6H PRN Karmen Bongo, MD   4 mg at 09/14/21 1013   oxyCODONE (Oxy IR/ROXICODONE) immediate release tablet 5 mg  5 mg Oral Q4H PRN Karmen Bongo, MD   5 mg at 09/15/21 0956   pantoprazole (PROTONIX) EC tablet 40 mg  40 mg Oral Daily Karmen Bongo, MD   40 mg at 09/15/21 6314   sodium bicarbonate tablet 1,300 mg  1,300 mg Oral BID Karmen Bongo, MD   1,300 mg at 09/15/21 9702   sodium chloride 0.9 % bolus 250 mL  250 mL Intravenous Once Oswald Hillock, MD   Stopped at 09/12/21 1206   sodium chloride flush (NS) 0.9 % injection 3 mL  3 mL Intravenous Lillia Mountain, MD   3 mL at 09/15/21 1000   sorbitol 70 % solution 30 mL  30 mL Oral PRN Karmen Bongo, MD       zolpidem Lorrin Mais) tablet 5 mg  5 mg Oral QHS PRN Karmen Bongo, MD   5 mg at 09/13/21 2133    Pt meds include: Statin :No Betablocker: Yes ASA: Yes Other anticoagulants/antiplatelets: Plavix  Past Medical History:  Diagnosis Date   Acute respiratory failure with hypoxia (HCC) 11/01/2019   Anxiety    Arthritis    Chest pain    Chronic kidney disease    Dyspnea    Elevated troponin 12/13/2018   Headache(784.0)    Hyperlipidemia    Hypertension    Joint pain    Leg pain    Peripheral neuropathy    Peripheral vascular disease ([place])    Syncope 09/2020    Past Surgical History:  Procedure Laterality Date   ABDOMINAL ANGIOGRAM N/A 10/29/2011   Procedure: ABDOMINAL ANGIOGRAM;  Surgeon: Elam Dutch, MD;  Location: [facility] CATH LAB;  Service: Cardiovascular;  Laterality: N/A;   BACK SURGERY     COLONOSCOPY     ESOPHAGOGASTRODUODENOSCOPY     FEMORAL-FEMORAL BYPASS GRAFT  08/31/2010   FLEXIBLE SIGMOIDOSCOPY N/A 06/12/2019    Procedure: FLEXIBLE SIGMOIDOSCOPY;  Surgeon: Lucio Edward  T, MD;  Location: [place] ENDOSCOPY;  Service: Endoscopy;  Laterality: N/A;  Patient had little bit to eat this morning so this will be unsedated.   JOINT REPLACEMENT Right 10/18/2010   Hip   LOWER EXTREMITY ANGIOGRAPHY  06/14/2017   Procedure: Lower Extremity Angiography;  Surgeon: Adrian Prows, MD;  Location: [place] CV LAB;  Service: Cardiovascular;;  Bilateral limited angio performed   RENAL ANGIOGRAPHY N/A 06/14/2017   Procedure: Renal Angiography;  Surgeon: Adrian Prows, MD;  Location: [place] CV LAB;  Service: Cardiovascular;  Laterality: N/A;   TOTAL HIP ARTHROPLASTY     right    Social History Social History   Tobacco Use   Smoking status: Former    Packs/day: 1.00    Years: 50.00    Pack years: 50.00    Types: Cigarettes    Quit date: 04/22/2021    Years since quitting: 0.4   Smokeless tobacco: Never   Tobacco comments:    Quit 3 months ago 07/23/21 [facility]  Vaping Use   Vaping Use: Never used  Substance Use Topics   Alcohol use: No   Drug use: No    Family History Family History  Problem Relation Age of Onset   Heart attack Mother    Heart disease Mother    Hyperlipidemia Mother    Hypertension Mother    Heart attack Father    Heart disease Father        Before age 51   Hyperlipidemia Father    Hypertension Father    Cancer Sister        brain tumor   Aneurysm Brother        brain   Colon cancer Neg Hx    Liver cancer Neg Hx     No Known Allergies   REVIEW OF SYSTEMS  General: [ ]  Weight loss, [ ]  Fever, [ ]  chills Neurologic: [ ]  Dizziness, [ ]  Blackouts, [ ]  Seizure [ ]  Stroke, [ ]  "Mini stroke", [ ]  Slurred speech, [ ]  Temporary blindness; [ ]  weakness in arms or legs, [ ]  Hoarseness [ ]  Dysphagia Cardiac: [ ]  Chest pain/pressure, [x ] Shortness of breath at rest [ x] Shortness of breath with exertion, [ ]  Atrial fibrillation or irregular heartbeat  Vascular: [ ]  Pain in legs with walking, [  ] Pain in legs at rest, [ ]  Pain in legs at night,  [ ]  Non-healing ulcer, [ ]  Blood clot in vein/DVT,   Pulmonary: [ ]  Home oxygen, [ ]  Productive cough, [ ]  Coughing up blood, [ ]  Asthma,  [ ]  Wheezing [ ]  COPD Musculoskeletal:  [ ]  Arthritis, [ ]  Low back pain, [ ]  Joint pain Hematologic: [ ]  Easy Bruising, [ x] Anemia; [ ]  Hepatitis Gastrointestinal: [ ]  Blood in stool, [ ]  Gastroesophageal Reflux/heartburn, Urinary: [ x] chronic Kidney disease, [ ]  on HD - [ ]  MWF or [ ]  TTHS, [ ]  Burning with urination, [ ]  Difficulty urinating Skin: [ ]  Rashes, [ ]  Wounds Psychological: [ ]  Anxiety, [ ]  Depression  Physical Examination Vitals:   09/14/21 2225 09/15/21 0256 09/15/21 0803 09/15/21 0839  BP: (!) 156/86 (!) 150/82 (!) 152/109   Pulse: 86  84   Resp: 13 14 15    Temp: 98 F (36.7 C) 98.7 F (37.1 C) 98.8 F (37.1 C)   TempSrc:  Oral Oral   SpO2: 99%  98% 98%  Weight:      Height:  Body mass index is 16.83 kg/m.  General:  WDWN in NAD HENT: WNL Eyes: Pupils equal Pulmonary: normal non-labored breathing , without Rales, rhonchi,  wheezing Cardiac: RRR, without  Murmurs, rubs or gallops; No carotid bruits Abdomen: soft, left UQ tenderness, no masses Skin: no rashes, ulcers noted;  no Gangrene , no cellulitis; no open wounds;   Vascular Exam/Pulses:Palpable radial, fem-fem bypass pulse, doppler signals PT/DP/B LE feet warm and well perfused without ischemic changes.   Musculoskeletal: no muscle wasting or atrophy; no edema  Neurologic: A&O X 3; Appropriate Affect ;  SENSATION: normal; MOTOR FUNCTION: grossly intact and  Symmetric, right leg shorter since right THA Speech is fluent/normal   Significant Diagnostic Studies: CBC Lab Results  Component Value Date   WBC 10.7 (H) 09/15/2021   HGB 6.8 (LL) 09/15/2021   HCT 20.4 (L) 09/15/2021   MCV 84.6 09/15/2021   PLT 476 (H) 09/15/2021    BMET    Component Value Date/Time   NA 138 09/15/2021 0355   K 4.7  09/15/2021 0355   CL 103 09/15/2021 0355   CO2 29 09/15/2021 0355   GLUCOSE 117 (H) 09/15/2021 0355   BUN 49 (H) 09/15/2021 0355   CREATININE 2.41 (H) 09/15/2021 0355   CALCIUM 8.6 (L) 09/15/2021 0355   GFRNONAA 20 (L) 09/15/2021 0355   GFRAA 30 (L) 07/10/2020 0137   Estimated Creatinine Clearance: 13.1 mL/min (A) (by C-G formula based on SCr of 2.41 mg/dL (H)).  COAG Lab Results  Component Value Date   INR 1.2 07/06/2020   INR 2.2 (H) 07/06/2020   INR 1.0 11/01/2019     Non-Invasive Vascular Imaging:  EXAM: CT ABDOMEN AND PELVIS WITHOUT CONTRAST   TECHNIQUE: Multidetector CT imaging of the abdomen and pelvis was performed following the standard protocol without IV contrast.   COMPARISON:  06/16/2021   FINDINGS: Lower chest: Bibasilar collapse/consolidation, left greater than right. The left lower lobe collapse seen previously has decreased in the interval.   Hepatobiliary: No focal abnormality in the liver on this study without intravenous contrast. There is no evidence for gallstones, gallbladder wall thickening, or pericholecystic fluid. Dilatation of the common bile duct again noted measuring up to 10 mm in the head of the pancreas, similar to prior.   Pancreas: Diffuse parenchymal atrophy without appreciable main duct dilatation.   Spleen: No splenomegaly. No focal mass lesion.   Adrenals/Urinary Tract: No adrenal nodule or mass. Right kidney atrophic. No hydronephrosis. 14 mm low-density lesion upper interpolar left kidney is stable, likely a cyst. No evidence for hydroureter. The urinary bladder appears normal for the degree of distention.   Stomach/Bowel: Stomach is decompressed. Duodenum is normally positioned as is the ligament of Treitz. No small bowel wall thickening. No small bowel dilatation. The terminal ileum is normal. The appendix is not well visualized, but there is no edema or inflammation in the region of the cecum. Diffuse wall  thickening noted in the distal descending and sigmoid colon with diverticular disease. No findings to suggest diverticulitis.   Vascular/Lymphatic: There is advanced atherosclerotic calcification of the abdominal aorta without aneurysm. There is no gastrohepatic or hepatoduodenal ligament lymphadenopathy. No retroperitoneal or mesenteric lymphadenopathy. No pelvic sidewall lymphadenopathy. Fem-fem bypass graft again noted. Lesion in the right groin apparently incorporating the graft anastomosis measures 5.5 x 4.8 cm today compared to 4.8 x 4.1 cm previously. Imaging features today suggest hematoma or pseudoaneurysm   Reproductive: Calcified fibroid noted in the uterus. There is no adnexal mass.  Other: Small volume intraperitoneal free fluid likely although inferior pelvis some low-dose cured by beam hardening artifact from right hip replacement.   Musculoskeletal: Right hip prosthesis again noted. Bones are diffusely demineralized. Lumbar fusion hardware evident.   IMPRESSION: 1. Diffuse wall thickening in the distal descending and sigmoid colon with diverticular disease. No findings to suggest diverticulitis. 2. 5.5 x 4.8 cm lesion in the right groin apparently incorporating the graft anastomosis. Imaging features today suggests hematoma or pseudoaneurysm. 3. Small volume intraperitoneal free fluid. 4. Bibasilar collapse/consolidation, left greater than right. The left lower lobe collapse seen previously has improved in the interval. 5. Stable dilatation of the common bile duct. 6. Aortic Atherosclerosis (ICD10-I70.0).    ASSESSMENT/PLAN:  S/P left to right fem-fem bypass by Dr. Oneida Alar 2011 She was last seen by Dr. Stanford Breed 06/27/21 with known large (>4 cm) right femoral anastomotic pseudoaneurysm.   He had planned to repair the right anastomosis, but the patient was ill at the time prior prolonged admission from 8/29-9/23 acute respiratory failure associated with LLL  collapse with COPD and active herpes zoster infection.    Once she is stable we will make plans to repair the right femoral anastomotic pseudoaneurysm.    Roxy Horseman 09/15/2021 1:44 PM  I agree with the above.  I have seen and evaluated the patient.  She has a chronic right femoral pseudoaneurysm, likely from the anastomosis of her femoral-femoral bypass graft which was placed by Dr. Oneida Alar for rest pain several years ago.  There is no evidence of infection.  It is pulsatile on exam.  This has grown several millimeters over the past few months but has been present for a long time.  I think this needs to be repaired.  She will need medical and cardiology clearance prior to proceeding.  We will continue to follow the patient while she is in the hospital.  I do not think we have time to get this done this week.  If she is safe to be discharged home, I can get this scheduled as an outpatient.  Annamarie Major

## 2021-09-15 NOTE — Care Management Important Message (Signed)
Important Message  Patient Details  Name: [name] MRN: [id number] Date of Birth: [birth year]   Medicare Important Message Given:  Yes     Orbie Pyo 09/15/2021, 2:12 PM

## 2021-09-15 NOTE — Progress Notes (Signed)
Physical Therapy Treatment Patient Details Name: Alyssa [name] MRN: [id number] DOB: [birth year] Today's Date: 09/15/2021   History of Present Illness Pt is a 80 y.o. female admitted 09/10/21 with SOB and L-side chest discomfort. Workup for hypertensive urgency. Pt with persistent LUQ pain; abdominal/pelvic CT 11/28 showed R groin lesion suggestive of hematoma or pseudoaneurysm. PMH includes HTN, PVD, CKD 4, COPD, dementia; of note, recent admission 8/29-9/23/22 for ARF due to LLL colapse and herpes zoster infection, and pt recently returned home from SNF since that admission.   PT Comments    Pt slowly progressing with mobility; activity progression limited by c/o persistent, significant abdominal pain. Pt remains limited by pain, generalized weakness, and decreased activity tolerance. Continue to recommend SNF-level therapies to maximize functional mobility and independence prior to return home. If pt to return home, will require assist for mobility and ADL tasks, as well as potential wheelchair use.    Recommendations for follow up therapy are one component of a multi-disciplinary discharge planning process, led by the attending physician.  Recommendations may be updated based on patient status, additional functional criteria and insurance authorization.  Follow Up Recommendations  Skilled nursing-short term rehab (<3 hours/day)     Assistance Recommended at Discharge Frequent or constant Supervision/Assistance  Equipment Recommendations  None recommended by PT    Recommendations for Other Services       Precautions / Restrictions Precautions Precautions: Fall Restrictions Weight Bearing Restrictions: No     Mobility  Bed Mobility Overal bed mobility: Needs Assistance Bed Mobility: Rolling;Sidelying to Sit Rolling: Supervision Sidelying to sit: Min assist       General bed mobility comments: Educ on log roll technique in hopes to not exacerbate abdominal pain; pt  rolling well with rail assist, minA for trunk elevation    Transfers Overall transfer level: Needs assistance Equipment used: 1 person hand held assist Transfers: Sit to/from Stand;Bed to chair/wheelchair/BSC Sit to Stand: Min assist;Min guard     Step pivot transfers: Min assist     General transfer comment: Pt declines use of RW, initial minA for HHA to stand and stabilize for step pivot to recliner; additional stand from recliner with min guard for balance    Ambulation/Gait               General Gait Details: pt declined secondary to abdominal pain, does not endorse LLE pain with weight bearing this session   Stairs             Wheelchair Mobility    Modified Rankin (Stroke Patients Only)       Balance Overall balance assessment: Needs assistance Sitting-balance support: Feet supported;No upper extremity supported Sitting balance-Leahy Scale: Fair       Standing balance-Leahy Scale: Fair Standing balance comment: can static stand without UE support, requires assist for posterior pericare                            Cognition Arousal/Alertness: Awake/alert Behavior During Therapy: WFL for tasks assessed/performed;Flat affect Overall Cognitive Status: Within Functional Limits for tasks assessed                                 General Comments: WFL for simple tasks; internally distracted by pain, but following simple commands and interacting appropriately. Per chart, has h/o dementia        Exercises  General Comments General comments (skin integrity, edema, etc.): SpO2 96% on RA      Pertinent Vitals/Pain Pain Assessment: Faces Faces Pain Scale: Hurts whole lot Pain Location: L-side abdomen Pain Descriptors / Indicators: Discomfort;Grimacing;Guarding Pain Intervention(s): Monitored during session;Limited activity within patient's tolerance;Repositioned    Home Living                          Prior  Function            PT Goals (current goals can now be found in the care plan section) Progress towards PT goals: Progressing toward goals    Frequency    Min 3X/week      PT Plan Current plan remains appropriate    Co-evaluation              AM-PAC PT "6 Clicks" Mobility   Outcome Measure  Help needed turning from your back to your side while in a flat bed without using bedrails?: A Little Help needed moving from lying on your back to sitting on the side of a flat bed without using bedrails?: A Little Help needed moving to and from a bed to a chair (including a wheelchair)?: A Little Help needed standing up from a chair using your arms (e.g., wheelchair or bedside chair)?: A Little Help needed to walk in hospital room?: Total Help needed climbing 3-5 steps with a railing? : Total 6 Click Score: 14    End of Session Equipment Utilized During Treatment: Gait belt Activity Tolerance: Patient limited by pain Patient left: in chair;with call bell/phone within reach Nurse Communication: Mobility status;Other (comment) (RN ok with no chair alarm) PT Visit Diagnosis: Pain;Muscle weakness (generalized) (M62.81);Difficulty in walking, not elsewhere classified (R26.2)     Time: 4010-2725 PT Time Calculation (min) (ACUTE ONLY): 21 min  Charges:  $Therapeutic Activity: 8-22 mins                     Mabeline Caras, PT, DPT Acute Rehabilitation Services  Pager [phone] Office [phone]  Derry Lory 09/15/2021, 12:20 PM

## 2021-09-15 NOTE — TOC Progression Note (Signed)
Transition of Care [facility]) - Progression Note    Patient Details  Name: Alyssa [name] MRN: [id number] Date of Birth: January 19, 1941  Transition of Care [facility] CM/SW Contact  Zenon Mayo, RN Phone Number: 09/15/2021, 4:15 PM  Clinical Narrative:    Receiving one unit of prbc today, hgb 6.8, need occult sample, for CT of abd today secondary to pain. Refusing SNF wans home with [facility], she lives with son. TOC will continue to follow for dc needs.   Expected Discharge Plan: [employer] Barriers to Discharge: Continued Medical Work up  Expected Discharge Plan and Services Expected Discharge Plan: [place] In-house Referral: Clinical Social Work   Post Acute Care Choice: [place] arrangements for the past 2 months: [place] Determinants of Health (SDOH) Interventions    Readmission Risk Interventions Readmission Risk Prevention Plan 07/10/2020 12/26/2018  Transportation Screening Complete Complete  Medication Review [employer]) Complete Complete  PCP or Specialist appointment within 3-5 days of discharge - Not Complete  PCP/Specialist Appt Not Complete comments - pt for snf. Snf MD to see as needed.  [place] or Home Care Consult Complete Not Complete  HRI or Home Care Consult Pt Refusal Comments - NA  SW Recovery Care/Counseling Consult Complete Not Complete  SW Consult Not Complete Comments - NA  Palliative Care Screening Not Applicable Complete  Skilled Nursing Facility Not Applicable Complete  Some recent data might be hidden

## 2021-09-15 NOTE — TOC Initial Note (Signed)
Transition of Care [facility]) - Initial/Assessment Note    Patient Details  Name: Alyssa [name] MRN: [id number] Date of Birth: 28-May-1941  Transition of Care [facility]) CM/SW Contact:    Tresa Endo Phone Number: 09/15/2021, 4:12 PM  Clinical Narrative:                 Pt came from home with family and does not plan to go to a SNF. Pt son states that pt came from [facility] a few weeks ago and they had a bad experience. Pt and pt son would like pt to go home with [facility]. Pt son stated pt has family support at home. CSW will update NCM, new plan for pt is HH.  Expected Discharge Plan: Skilled Nursing Facility Barriers to Discharge: Continued Medical Work up   Patient Goals and CMS Choice Patient states their goals for this hospitalization and ongoing recovery are:: Return Home with [facility] CMS Medicare.gov Compare Post Acute Care list provided to:: Patient Choice offered to / list presented to : Patient  Expected Discharge Plan and Services Expected Discharge Plan: [place] In-house Referral: Clinical Social Work   Post Acute Care Choice: [place] arrangements for the past 2 months: [place]                                      Prior Living Arrangements/Services Living arrangements for the past 2 months: Single Family Home Lives with:: Adult Children Patient language and need for interpreter reviewed:: Yes Do you feel safe going back to the place where you live?: Yes      Need for Family Participation in Patient Care: Yes (Comment) Care giver support system in place?: Yes (comment)   Criminal Activity/Legal Involvement Pertinent to Current Situation/Hospitalization: No - Comment as needed  Activities of Daily Living Home Assistive Devices/Equipment: Walker (specify type), Shower chair with back, Bedside commode/3-in-1, Dentures (specify type), Blood pressure cuff, Grab bars in shower, Raised toilet seat with rails ADL Screening  (condition at time of admission) Patient's cognitive ability adequate to safely complete daily activities?: Yes Is the patient deaf or have difficulty hearing?: No Does the patient have difficulty seeing, even when wearing glasses/contacts?: No Does the patient have difficulty concentrating, remembering, or making decisions?: No Patient able to express need for assistance with ADLs?: Yes Does the patient have difficulty dressing or bathing?: No Independently performs ADLs?: Yes (appropriate for developmental age) Does the patient have difficulty walking or climbing stairs?: Yes Weakness of Legs: Right Weakness of Arms/Hands: None  Permission Sought/Granted Permission sought to share information with : Family Supports Permission granted to share information with : Yes, Verbal Permission Granted  Share Information with NAME: Alyssa [name] (Son)   [phone]  Permission granted to share info w AGENCY: N/A  Permission granted to share info w Relationship: [name], [name] (Son)   [phone]  Permission granted to share info w Contact Information: [name], [name] (Son)   [phone]  Emotional Assessment Appearance:: Appears stated age   Affect (typically observed): Appropriate Orientation: : Oriented to Self, Oriented to Place, Oriented to  Time, Oriented to Situation Alcohol / Substance Use: Not Applicable Psych Involvement: No (comment)  Admission diagnosis:  Hypertensive crisis [I16.9] Hypertensive emergency [I16.1] AKI (acute kidney injury) [facility]) [N17.9] Patient Active Problem List   Diagnosis Date Noted   Hypertensive crisis 09/10/2021   Rectal bleeding    Rectal pain  Malnutrition of moderate degree 06/17/2021   Weight loss 06/16/2021   Zoster 06/16/2021   Collapsed lung    COPD  GOLD 0/ still smoking  09/26/2020   Cigarette smoker 09/26/2020   Shortness of breath 07/06/2020   Essential hypertension 07/06/2020   Abdominal pain 07/06/2020   Elevated INR  07/06/2020   Acute respiratory failure due to COVID-19 [facility]) 06/02/2020   Acute renal failure superimposed on chronic kidney disease ([place]) 02/06/2020   Leukocytosis 02/06/2020   Thrombocytosis 02/06/2020   Palliative care encounter    Constipation 01/05/2020   Protein-calorie malnutrition, severe ([place]) 01/04/2020   Acute on chronic renal failure ([place]) 01/04/2020   Hyperkalemia 01/03/2020   AKI (acute kidney injury) ([place]) 01/03/2020   Acute respiratory failure with hypoxia ([place]) 11/01/2019   UTI (urinary tract infection) 11/01/2019   CAP (community acquired pneumonia) 11/01/2019   Acute cystitis without hematuria    Encephalopathy 09/18/2019   SAH (subarachnoid hemorrhage) ([place]) 09/18/2019   Dementia ([place]) 78/29/5621   Metabolic acidosis 30/86/5784   ARF (acute renal failure) ([place]) 09/17/2019   Acute encephalopathy 09/17/2019   Abnormal CT scan, colon    Diarrhea    Diverticulitis 06/07/2019   Depression with anxiety 06/07/2019   DNR (do not resuscitate) 03/23/2019   Volume overload 03/23/2019   Edema 03/23/2019   Acute renal failure (ARF) ([place]) 03/08/2019   Hypokalemia 03/08/2019   Palliative care by specialist    Goals of care, counseling/discussion    Altered mental status    Encephalopathy, toxic 12/24/2018   Aspiration pneumonia of both lower lobes due to gastric secretions ([place]) 12/24/2018   Non-ST elevation (NSTEMI) myocardial infarction [facility])    Chest pain    Dyspnea on exertion    Elevated troponin level 12/09/2018   Chronic migraine 03/27/2018   Lumbar radiculopathy 03/27/2018   Gait abnormality 03/27/2018   Hypertensive urgency 12/28/2016   CKD (chronic kidney disease) stage 4, GFR 15-29 ml/min (HCC) 12/28/2016   Anemia 12/28/2016   Chronic pain syndrome 12/28/2016   Benzodiazepine withdrawal without complication ([place]) 69/62/9528   Chronic right shoulder pain 12/06/2016   Fall 09/17/2016   Fracture of humeral head, closed, right, initial encounter 09/17/2016    Rib fractures 09/17/2016   Multiple falls 09/17/2016   Severe muscle deconditioning 09/17/2016   Failure to thrive in adult 09/17/2016   Melena 04/25/2016   Transient hypotension 04/25/2016   Anxiety state 04/25/2016   Tobacco abuse 04/25/2016   Hyperlipidemia 04/25/2016   Syncope 04/24/2016   Near syncope 04/24/2016   Gait difficulty 01/12/2016   Foot drop, left 01/12/2016   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 [place] [address] Phone: [phone] Fax: [phone]     Social Determinants of Health (SDOH) Interventions    Readmission Risk Interventions Readmission Risk Prevention Plan 07/10/2020 12/26/2018  Transportation Screening Complete Complete  Medication Review (RN Care Manager) Complete Complete  PCP or Specialist appointment within 3-5 days of discharge - Not Complete  PCP/Specialist Appt Not Complete comments - pt for snf. Snf MD to see as needed.  [place] or Home Care Consult Complete Not Complete  [place] or Home Care Consult Pt Refusal Comments - NA  SW Recovery Care/Counseling Consult Complete Not Complete  SW Consult Not Complete Comments - NA  Palliative Care Screening Not Applicable Complete  Skilled Nursing Facility Not Applicable Complete  Some recent data might be hidden

## 2021-09-15 NOTE — Progress Notes (Signed)
[facility]  PROGRESS NOTE  Alyssa [name]:[id number] DOB: 12/17/1940 DOA: 09/10/2021 PCP: System, Provider Not In   Brief HPI:   80 year old female with medical history of hypertension, hyperlipidemia, PVD, stage IV CKD, COPD, dementia and prior prolonged admission from 8/29-9 /23, acute respiratory failure associated with LLL collapse with COPD and active herpes infection.  She reports 2 to 3 days of mostly shortness of breath with mild left-sided chest discomfort. VQ scan was ordered to rule out pulmonary embolism, which turned out to be negative for PE.  BNP was elevated to 1000 but did not appear volume overloaded.  She was found to be in hypertensive urgency.  Patient was given labetalol for elevated blood pressure.    Subjective   Patient seen, denies abdominal pain.  CT abdomen pelvis obtained this morning shows diffuse wall thickening in the distal descending and sigmoid colon with diverticular disease, no findings to suggest diverticulitis.  5.5 x 4.8 cm lesion right groin apparently incorporating  the graft anastomosis.  Imaging suggest hematoma or pseudoaneurysm.  Bibasilar collapse/consolidation left more than right, left lower lobe collapse seen previously has improved in the interval.   Assessment/Plan:     Chest pain/dyspnea -Likely in setting of hypertensive crisis -Patient was started  empirically on heparin GTT for possible PE/ACS -VQ scan was negative for PE, cardiology has ruled out ACS -Mild elevation of troponin was likely from demand ischemia -No further intervention planned per cardiology -Heparin was discontinued  -Echocardiogram was ordered, no evidence of systolic heart failure on echo. -Cardiology has signed off  Left upper quadrant abdominal pain -Unclear etiology -CT abdomen/pelvis obtained, results as above -No clear findings to suggest etiology for left upper quadrant pain. -Likely muscular as pain is worse on movement -We will  start Flexeril 5 mg 3 times daily for 3 doses  Pseudoaneurysm versus hematoma -Incidental finding on CT abdomen/pelvis as above -CT scan shows 5.5 x 4.8 cm lesion in the right groin apparently incorporating the graft anastomosis -Imaging suggest hematoma versus pseudoaneurysm -Patient was seen by vascular surgery in September 2022 for the same and was supposed to follow-up with vascular surgery as outpatient for repair of pseudoaneurysm -I called and discussed with Dr. Trula Slade, he will see patient in the hospital.   Hypertensive crisis -Resolved  Hypotension -Resolved -Blood pressure dropped after starting home medications  -She was started on home medications, amlodipine carvedilol and clonidine. -Dose of clonidine was changed to 0.2 mg p.o. twice daily -Patient has become hypotensive since dose of clonidine was increased -Dose of clonidine was changed to 0.1 mg p.o. twice daily; patient continues to remain hypotensive -Amlodipine was discontinued -Continue Coreg 12.5 mg p.o. twice daily -I will add low-dose amlodipine 5 mg daily as blood pressure is mildly elevated   Acute kidney injury on stage IV CKD -Patient's baseline creatinine is around 2.75-2.85 -Creatinine jumped to 3.02 -Creatinine has now improved to 2.41 -Follow renal function in a.m.  Peripheral vascular disease -Continue aspirin, Plavix  Hypokalemia -Patient presents with potassium of less than 2 -Replete  Dementia -Continue Aricept, duloxetine, hydroxyzine  Malnutrition -BMI 16.83 kg per metered square -Likely from poor p.o. intake -Dietitian consulted  Anemia of chronic disease -Hemoglobin was low at 7.2 today -Today hemoglobin is down to 6.8 -We will transfuse 1 unit PRBC; follow CBC in a.m. -Serum iron 60, saturation 27%, B12 1802 -FOBT ordered   Medications     sodium chloride   Intravenous Once   amLODipine  5 mg Oral  Daily   aspirin EC  81 mg Oral Daily   carvedilol  12.5 mg Oral BID    clopidogrel  75 mg Oral Daily   dicyclomine  20 mg Oral TID   docusate sodium  100 mg Oral BID   feeding supplement (NEPRO CARB STEADY)  237 mL Oral BID BM   fluticasone furoate-vilanterol  1 puff Inhalation Daily   And   umeclidinium bromide  1 puff Inhalation Daily   heparin injection (subcutaneous)  5,000 Units Subcutaneous Q8H   hydrOXYzine  25 mg Oral QHS   megestrol  40 mg Oral BID   multivitamin  1 tablet Oral QHS   pantoprazole  40 mg Oral Daily   sodium bicarbonate  1,300 mg Oral BID   sodium chloride flush  3 mL Intravenous Q12H     Data Reviewed:   CBG:  No results for input(s): GLUCAP in the last 168 hours.  SpO2: 98 % O2 Flow Rate (L/min): 2 L/min    Vitals:   09/14/21 2225 09/15/21 0256 09/15/21 0803 09/15/21 0839  BP: (!) 156/86 (!) 150/82 (!) 152/109   Pulse: 86  84   Resp: 13 14 15    Temp: 98 F (36.7 C) 98.7 F (37.1 C) 98.8 F (37.1 C)   TempSrc:  Oral Oral   SpO2: 99%  98% 98%  Weight:      Height:         Intake/Output Summary (Last 24 hours) at 09/15/2021 1338 Last data filed at 09/15/2021 0800 Gross per 24 hour  Intake 477 ml  Output --  Net 477 ml    11/27 1901 - 11/29 0700 In: 914 [P.O.:914] Out: 850 [Urine:850]  Filed Weights   09/10/21 1431  Weight: 43.1 kg    Data Reviewed: Basic Metabolic Panel: Recent Labs  Lab 09/10/21 2325 09/11/21 0651 09/11/21 0914 09/12/21 0052 09/13/21 0046 09/14/21 0034 09/15/21 0355  NA  --  134*  --  129* 130* 132* 138  K 2.1* 3.7  --  3.9 4.0 4.8 4.7  CL  --  100  --  96* 97* 98 103  CO2  --  24  --  25 25 24 29   GLUCOSE  --  72  --  118* 148* 121* 117*  BUN  --  40*  --  49* 54* 54* 49*  CREATININE  --  2.53*  --  3.02* 2.93* 2.57* 2.41*  CALCIUM  --  7.8*  --  7.7* 7.8* 8.2* 8.6*  MG 2.7*  --   --   --   --   --   --   PHOS  --   --  2.8  --   --   --   --    Liver Function Tests: Recent Labs  Lab 09/10/21 1446 09/15/21 0355  AST 47* 21  ALT 32 19  ALKPHOS 111 76   BILITOT 1.2 0.2*  PROT 6.8 5.4*  ALBUMIN 3.1* 2.2*   No results for input(s): LIPASE, AMYLASE in the last 168 hours. No results for input(s): AMMONIA in the last 168 hours. CBC: Recent Labs  Lab 09/10/21 1446 09/10/21 1535 09/11/21 0104 09/12/21 0052 09/13/21 0046 09/14/21 0034 09/15/21 0355 09/15/21 0603  WBC 11.1*  --  8.3 7.5 8.8 9.8 10.7*  --   NEUTROABS 7.6  --   --   --   --   --   --   --   HGB 9.5*   < > 7.4* 7.2*  7.2* 8.3* 6.9* 6.8*  HCT 29.3*   < > 23.4* 21.3* 21.7* 25.2* 20.9* 20.4*  MCV 84.7  --  86.0 83.9 84.8 85.7 84.6  --   PLT 662*  --  509* 470* 455* 495* 476*  --    < > = values in this interval not displayed.   Cardiac Enzymes: No results for input(s): CKTOTAL, CKMB, CKMBINDEX, TROPONINI in the last 168 hours. BNP (last 3 results) Recent Labs    09/10/21 1446  BNP 1,006.5*    ProBNP (last 3 results) No results for input(s): PROBNP in the last 8760 hours.  CBG: No results for input(s): GLUCAP in the last 168 hours.     Radiology Reports  CT ABDOMEN PELVIS WO CONTRAST  Result Date: 09/15/2021 CLINICAL DATA:  Abdominal pain. EXAM: CT ABDOMEN AND PELVIS WITHOUT CONTRAST TECHNIQUE: Multidetector CT imaging of the abdomen and pelvis was performed following the standard protocol without IV contrast. COMPARISON:  06/16/2021 FINDINGS: Lower chest: Bibasilar collapse/consolidation, left greater than right. The left lower lobe collapse seen previously has decreased in the interval. Hepatobiliary: No focal abnormality in the liver on this study without intravenous contrast. There is no evidence for gallstones, gallbladder wall thickening, or pericholecystic fluid. Dilatation of the common bile duct again noted measuring up to 10 mm in the head of the pancreas, similar to prior. Pancreas: Diffuse parenchymal atrophy without appreciable main duct dilatation. Spleen: No splenomegaly. No focal mass lesion. Adrenals/Urinary Tract: No adrenal nodule or mass. Right  kidney atrophic. No hydronephrosis. 14 mm low-density lesion upper interpolar left kidney is stable, likely a cyst. No evidence for hydroureter. The urinary bladder appears normal for the degree of distention. Stomach/Bowel: Stomach is decompressed. Duodenum is normally positioned as is the ligament of Treitz. No small bowel wall thickening. No small bowel dilatation. The terminal ileum is normal. The appendix is not well visualized, but there is no edema or inflammation in the region of the cecum. Diffuse wall thickening noted in the distal descending and sigmoid colon with diverticular disease. No findings to suggest diverticulitis. Vascular/Lymphatic: There is advanced atherosclerotic calcification of the abdominal aorta without aneurysm. There is no gastrohepatic or hepatoduodenal ligament lymphadenopathy. No retroperitoneal or mesenteric lymphadenopathy. No pelvic sidewall lymphadenopathy. Fem-fem bypass graft again noted. Lesion in the right groin apparently incorporating the graft anastomosis measures 5.5 x 4.8 cm today compared to 4.8 x 4.1 cm previously. Imaging features today suggest hematoma or pseudoaneurysm Reproductive: Calcified fibroid noted in the uterus. There is no adnexal mass. Other: Small volume intraperitoneal free fluid likely although inferior pelvis some low-dose cured by beam hardening artifact from right hip replacement. Musculoskeletal: Right hip prosthesis again noted. Bones are diffusely demineralized. Lumbar fusion hardware evident. IMPRESSION: 1. Diffuse wall thickening in the distal descending and sigmoid colon with diverticular disease. No findings to suggest diverticulitis. 2. 5.5 x 4.8 cm lesion in the right groin apparently incorporating the graft anastomosis. Imaging features today suggests hematoma or pseudoaneurysm. 3. Small volume intraperitoneal free fluid. 4. Bibasilar collapse/consolidation, left greater than right. The left lower lobe collapse seen previously has  improved in the interval. 5. Stable dilatation of the common bile duct. 6. Aortic Atherosclerosis (ICD10-I70.0). Electronically Signed   By: Misty Stanley M.D.   On: 09/15/2021 09:52   ECHOCARDIOGRAM COMPLETE  Result Date: 09/13/2021    ECHOCARDIOGRAM REPORT   Patient Name:   Alyssa [name] Date of Exam: 09/13/2021 Medical Rec #:  [id number]  Height:       63.0 in Accession #:    [id number]           Weight:       95.0 lb Date of Birth:  [birth year]           BSA:          1.409 m Patient Age:    56 years             BP:           121/73 mmHg Patient Gender: F                    HR:           84 bpm. Exam Location:  Inpatient Procedure: 2D Echo Indications:    chest pain  History:        Patient has prior history of Echocardiogram examinations, most                 recent 05/29/2020. Chronic kidney disease. and COPD,                 Signs/Symptoms:Shortness of Breath; Risk Factors:Hypertension.  Sonographer:    Johny Chess RDCS Referring Phys: [place]  1. Left ventricular ejection fraction, by estimation, is 60 to 65%. The left ventricle has normal function. The left ventricle has no regional wall motion abnormalities. There is moderate concentric left ventricular hypertrophy. Left ventricular diastolic parameters are consistent with Grade I diastolic dysfunction (impaired relaxation).  2. Right ventricular systolic function is normal. The right ventricular size is normal. There is normal pulmonary artery systolic pressure. The estimated right ventricular systolic pressure is 42.7 mmHg.  3. The mitral valve is grossly normal. Trivial mitral valve regurgitation. No evidence of mitral stenosis.  4. The aortic valve is tricuspid. There is mild calcification of the aortic valve. There is mild thickening of the aortic valve. Aortic valve regurgitation is trivial. Aortic valve sclerosis is present, with no evidence of aortic valve stenosis.  5. The inferior vena cava is  normal in size with greater than 50% respiratory variability, suggesting right atrial pressure of 3 mmHg. Comparison(s): Changes from prior study are noted. EF unchanged. LVH appears moderate on this study. FINDINGS  Left Ventricle: Left ventricular ejection fraction, by estimation, is 60 to 65%. The left ventricle has normal function. The left ventricle has no regional wall motion abnormalities. The left ventricular internal cavity size was normal in size. There is  moderate concentric left ventricular hypertrophy. Left ventricular diastolic parameters are consistent with Grade I diastolic dysfunction (impaired relaxation). Right Ventricle: The right ventricular size is normal. No increase in right ventricular wall thickness. Right ventricular systolic function is normal. There is normal pulmonary artery systolic pressure. The tricuspid regurgitant velocity is 2.47 m/s, and  with an assumed right atrial pressure of 3 mmHg, the estimated right ventricular systolic pressure is 06.2 mmHg. Left Atrium: Left atrial size was normal in size. Right Atrium: Right atrial size was normal in size. Pericardium: Trivial pericardial effusion is present. Mitral Valve: The mitral valve is grossly normal. Trivial mitral valve regurgitation. No evidence of mitral valve stenosis. Tricuspid Valve: The tricuspid valve is grossly normal. Tricuspid valve regurgitation is trivial. No evidence of tricuspid stenosis. Aortic Valve: The aortic valve is tricuspid. There is mild calcification of the aortic valve. There is mild thickening of the aortic valve. Aortic valve regurgitation is trivial. Aortic valve sclerosis is present, with no  evidence of aortic valve stenosis. Pulmonic Valve: The pulmonic valve was grossly normal. Pulmonic valve regurgitation is trivial. No evidence of pulmonic stenosis. Aorta: The aortic root and ascending aorta are structurally normal, with no evidence of dilitation. Venous: The inferior vena cava is normal in  size with greater than 50% respiratory variability, suggesting right atrial pressure of 3 mmHg. IAS/Shunts: The atrial septum is grossly normal.  LEFT VENTRICLE PLAX 2D LVIDd:         3.80 cm   Diastology LVIDs:         2.80 cm   LV e' medial:    6.53 cm/s LV PW:         1.38 cm   LV E/e' medial:  12.1 LV IVS:        1.37 cm   LV e' lateral:   8.27 cm/s LVOT diam:     1.90 cm   LV E/e' lateral: 9.6 LV SV:         50 LV SV Index:   36 LVOT Area:     2.84 cm  RIGHT VENTRICLE            IVC RV S prime:     9.90 cm/s  IVC diam: 1.20 cm TAPSE (M-mode): 1.5 cm LEFT ATRIUM             Index        RIGHT ATRIUM           Index LA diam:        3.80 cm 2.70 cm/m   RA Area:     11.60 cm LA Vol (A2C):   40.0 ml 28.38 ml/m  RA Volume:   24.90 ml  17.67 ml/m LA Vol (A4C):   41.7 ml 29.59 ml/m LA Biplane Vol: 40.8 ml 28.95 ml/m  AORTIC VALVE LVOT Vmax:   105.00 cm/s LVOT Vmean:  67.700 cm/s LVOT VTI:    0.177 m  AORTA Ao Root diam: 2.80 cm Ao Asc diam:  3.70 cm MITRAL VALVE                TRICUSPID VALVE MV Area (PHT): 3.91 cm     TR Peak grad:   24.4 mmHg MV Decel Time: 194 msec     TR Vmax:        247.00 cm/s MV E velocity: 79.20 cm/s MV A velocity: 148.00 cm/s  SHUNTS MV E/A ratio:  0.54         Systemic VTI:  0.18 m                             Systemic Diam: 1.90 cm Eleonore Chiquito MD Electronically signed by Eleonore Chiquito MD Signature Date/Time: 09/13/2021/4:58:19 PM    Final        Antibiotics: Anti-infectives (From admission, onward)    None         DVT prophylaxis: Heparin  Code Status: DNR  Family Communication: No family at bedside   Consultants: Cardiology  Procedures:     Objective    Physical Examination:  General-appears in no acute distress Heart-S1-S2, regular, no murmur auscultated Lungs-clear to auscultation bilaterally, no wheezing or crackles auscultated Abdomen-soft, nontender, no organomegaly Extremities-no edema in the lower extremities Neuro-alert, oriented x3,  no focal deficit noted  Status is: Inpatient  Dispo: The patient is from: Home              Anticipated d/c is to:  Home              Anticipated d/c date is: 09/16/2021              Patient currently not stable for discharge  Barrier to discharge-ongoing work-up for chest pain, hypertensive urgency  COVID-19 Labs  No results for input(s): DDIMER, FERRITIN, LDH, CRP in the last 72 hours.   Lab Results  Component Value Date   SARSCOV2NAA NEGATIVE 09/10/2021   [place] NEGATIVE 07/10/2021   [place] NEGATIVE 06/15/2021   [place] NEGATIVE 12/30/2020            Recent Results (from the past 240 hour(s))  Resp Panel by RT-PCR (Flu A&B, Covid) Nasopharyngeal Swab     Status: None   Collection Time: 09/10/21  2:46 PM   Specimen: Nasopharyngeal Swab; Nasopharyngeal(NP) swabs in vial transport medium  Result Value Ref Range Status   SARS Coronavirus 2 by RT PCR NEGATIVE NEGATIVE Final    Comment: (NOTE) SARS-CoV-2 target nucleic acids are NOT DETECTED.  The SARS-CoV-2 RNA is generally detectable in upper respiratory specimens during the acute phase of infection. The lowest concentration of SARS-CoV-2 viral copies this assay can detect is 138 copies/mL. A negative result does not preclude SARS-Cov-2 infection and should not be used as the sole basis for treatment or other patient management decisions. A negative result may occur with  improper specimen collection/handling, submission of specimen other than nasopharyngeal swab, presence of viral mutation(s) within the areas targeted by this assay, and inadequate number of viral copies(<138 copies/mL). A negative result must be combined with clinical observations, patient history, and epidemiological information. The expected result is Negative.  Fact Sheet for Patients:  EntrepreneurPulse.com.au  Fact Sheet for Healthcare Providers:  IncredibleEmployment.be  This test is no t  yet approved or cleared by the [place] FDA and  has been authorized for detection and/or diagnosis of SARS-CoV-2 by FDA under an Emergency Use Authorization (EUA). This EUA will remain  in effect (meaning this test can be used) for the duration of the COVID-19 declaration under Section 564(b)(1) of the Act, 21 U.S.C.section 360bbb-3(b)(1), unless the authorization is terminated  or revoked sooner.       Influenza A by PCR NEGATIVE NEGATIVE Final   Influenza B by PCR NEGATIVE NEGATIVE Final    Comment: (NOTE) The Xpert Xpress SARS-CoV-2/FLU/RSV plus assay is intended as an aid in the diagnosis of influenza from Nasopharyngeal swab specimens and should not be used as a sole basis for treatment. Nasal washings and aspirates are unacceptable for Xpert Xpress SARS-CoV-2/FLU/RSV testing.  Fact Sheet for Patients: EntrepreneurPulse.com.au  Fact Sheet for Healthcare Providers: IncredibleEmployment.be  This test is not yet approved or cleared by the [place] FDA and has been authorized for detection and/or diagnosis of SARS-CoV-2 by FDA under an Emergency Use Authorization (EUA). This EUA will remain in effect (meaning this test can be used) for the duration of the COVID-19 declaration under Section 564(b)(1) of the Act, 21 U.S.C. section 360bbb-3(b)(1), unless the authorization is terminated or revoked.  Performed at [facility], [place] [address], [place], [place] [address]   MRSA Next Gen by PCR, Nasal     Status: None   Collection Time: 09/11/21  4:11 AM   Specimen: Nasal Mucosa; Nasal Swab  Result Value Ref Range Status   MRSA by PCR Next Gen NOT DETECTED NOT DETECTED Final    Comment: (NOTE) The GeneXpert MRSA Assay (FDA approved for NASAL specimens only), is one  component of a comprehensive MRSA colonization surveillance program. It is not intended to diagnose MRSA infection nor to guide or monitor treatment for MRSA  infections. Test performance is not FDA approved in patients less than 73 years old. Performed at [facility], [place] [address] [place], [place] [address]     [facility] If 7PM-7AM, please contact night-coverage at [email or web address], Office  [phone]   09/15/2021, 1:38 PM  LOS: 3 days

## 2021-09-16 DIAGNOSIS — Z01810 Encounter for preprocedural cardiovascular examination: Secondary | ICD-10-CM | POA: Diagnosis not present

## 2021-09-16 DIAGNOSIS — R778 Other specified abnormalities of plasma proteins: Secondary | ICD-10-CM | POA: Diagnosis not present

## 2021-09-16 DIAGNOSIS — I169 Hypertensive crisis, unspecified: Secondary | ICD-10-CM | POA: Diagnosis not present

## 2021-09-16 DIAGNOSIS — N184 Chronic kidney disease, stage 4 (severe): Secondary | ICD-10-CM | POA: Diagnosis not present

## 2021-09-16 LAB — BASIC METABOLIC PANEL
Anion gap: 7 (ref 5–15)
BUN: 49 mg/dL — ABNORMAL HIGH (ref 8–23)
CO2: 29 mmol/L (ref 22–32)
Calcium: 8.6 mg/dL — ABNORMAL LOW (ref 8.9–10.3)
Chloride: 101 mmol/L (ref 98–111)
Creatinine, Ser: 2.66 mg/dL — ABNORMAL HIGH (ref 0.44–1.00)
GFR, Estimated: 18 mL/min — ABNORMAL LOW (ref >60)
Glucose, Bld: 106 mg/dL — ABNORMAL HIGH (ref 70–99)
Potassium: 5 mmol/L (ref 3.5–5.1)
Sodium: 137 mmol/L (ref 135–145)

## 2021-09-16 LAB — CBC
HCT: 26.5 % — ABNORMAL LOW (ref 36.0–46.0)
Hemoglobin: 8.9 g/dL — ABNORMAL LOW (ref 12.0–15.0)
MCH: 28.3 pg (ref 26.0–34.0)
MCHC: 33.6 g/dL (ref 30.0–36.0)
MCV: 84.4 fL (ref 80.0–100.0)
Platelets: 474 10*3/uL — ABNORMAL HIGH (ref 150–400)
RBC: 3.14 MIL/uL — ABNORMAL LOW (ref 3.87–5.11)
RDW: 17.3 % — ABNORMAL HIGH (ref 11.5–15.5)
WBC: 7.8 10*3/uL (ref 4.0–10.5)
nRBC: 0 % (ref 0.0–0.2)

## 2021-09-16 LAB — BPAM RBC
Blood Product Expiration Date: 202211302359
ISSUE DATE / TIME: 202211291401
Unit Type and Rh: 9500

## 2021-09-16 LAB — TYPE AND SCREEN
ABO/RH(D): O POS
Antibody Screen: NEGATIVE
Unit division: 0

## 2021-09-16 MED ORDER — AMLODIPINE BESYLATE 10 MG PO TABS
10.0000 mg | ORAL_TABLET | Freq: Every day | ORAL | Status: DC
  Administered 2021-09-17: 10 mg via ORAL
  Filled 2021-09-16: qty 1

## 2021-09-16 MED ORDER — AMLODIPINE BESYLATE 5 MG PO TABS
5.0000 mg | ORAL_TABLET | Freq: Once | ORAL | Status: AC
  Administered 2021-09-16: 5 mg via ORAL
  Filled 2021-09-16: qty 1

## 2021-09-16 NOTE — Progress Notes (Signed)
Patient's IV was accidentally pulled out last night with night shift nurse, was not restarted, patient was originally due to go home today, but will not be discharged until 09/17/21 now, however, I have not restarted IV since there are no IV meds ordered and patient has DNR status

## 2021-09-16 NOTE — Progress Notes (Signed)
PROGRESS NOTE    Alyssa [name]  CWC:[id number] DOB: 05-21-1941 DOA: 09/10/2021 PCP: System, Provider Not In   Brief Narrative:  80 year old female with medical history of hypertension, hyperlipidemia, PVD, stage IV CKD, COPD, dementia and prior prolonged admission from 8/29-9 /23, acute respiratory failure associated with LLL collapse with COPD and active herpes infection.  She reports 2 to 3 days of mostly shortness of breath with mild left-sided chest discomfort. VQ scan was ordered to rule out pulmonary embolism, which turned out to be negative for PE. BNP was elevated to 1000 but did not appear volume overloaded.  She was found to be in hypertensive urgency.  Patient was given labetalol for elevated blood pressure.   Assessment & Plan:   Hypertensive emergency with associated chest pain/dyspnea -Likely in setting of hypertensive crisis -Patient was started  empirically on heparin GTT for possible PE/ACS -VQ scan was negative for PE, cardiology has ruled out ACS -Mild elevation of troponin was likely from demand ischemia -No further intervention planned per cardiology -currently signed off -Heparin was discontinued  -Echocardiogram was ordered, no evidence of systolic heart failure on echo. -Blood pressure continues to be elevated titrating home medications   Left upper quadrant abdominal pain, resolving -Unclear etiology -CT abdomen/pelvis obtained, results as above -No clear findings to suggest etiology for left upper quadrant pain. -Likely muscular as pain is worse on movement -continue Flexeril as needed   Pseudoaneurysm versus hematoma -Incidental finding on CT abdomen/pelvis as above -CT scan shows 5.5 x 4.8 cm lesion in the right groin apparently incorporating the graft anastomosis -Imaging suggest hematoma versus pseudoaneurysm -Patient was seen by vascular surgery in September 2022 for the same and was supposed to follow-up with vascular surgery as outpatient for  repair of pseudoaneurysm -Vascular following, likely plan for outpatient repair.   Acute kidney injury on stage IV CKD, resolved - Patient's baseline creatinine is around 2.75-2.85  Peripheral vascular disease -Continue aspirin, Plavix   Hypokalemia -Resolved  Dementia -Continue Aricept, duloxetine, hydroxyzine   Malnutrition -BMI 16.83 kg per metered square -Likely from poor p.o. intake -Dietitian consulted   Chronic anemia of chronic disease -Hemoglobin resolved after 1 unit PRBC  DVT prophylaxis: Heparin Code Status: DNR Family Communication: None available  Status is: Inpatient  Dispo: The patient is from: Home              Anticipated d/c is to: Home              Anticipated d/c date is: 24 to 48 hours              Patient currently not medically stable for discharge  Consultants:  Vascular surgery, cardiology  Procedures:  Pending vascular evaluation  Antimicrobials:  None  Subjective: No acute issues or events overnight denies nausea vomiting diarrhea constipation headache fevers chills or chest pain  Objective: Vitals:   09/15/21 1900 09/15/21 1945 09/15/21 2335 09/16/21 0315  BP: (!) 145/85 (!) 151/86 (!) 151/85 (!) 148/84  Pulse: 90 89 79 79  Resp: 14 15 14 12   Temp: 98.6 F (37 C)  98.4 F (36.9 C) 98.5 F (36.9 C)  TempSrc: Oral  Oral Oral  SpO2: 99% 97% 99% 100%  Weight:      Height:        Intake/Output Summary (Last 24 hours) at 09/16/2021 0721 Last data filed at 09/15/2021 2200 Gross per 24 hour  Intake 812.5 ml  Output --  Net 812.5 ml   [employer]  09/10/21 1431  Weight: 43.1 kg    Examination:  General:  Pleasantly resting in bed, No acute distress. HEENT:  Normocephalic atraumatic.  Sclerae nonicteric, noninjected.  Extraocular movements intact bilaterally. Neck:  Without mass or deformity.  Trachea is midline. Lungs:  Clear to auscultate bilaterally without rhonchi, wheeze, or rales. Heart:  Regular rate and  rhythm.  Without murmurs, rubs, or gallops. Abdomen:  Soft, nontender, nondistended.  Without guarding or rebound.  Pelvic bruit noted Extremities: Without cyanosis, clubbing, edema, or obvious deformity. Vascular:  Dorsalis pedis and posterior tibial pulses palpable bilaterally. Skin:  Warm and dry, no erythema, no ulcerations.  Data Reviewed: I have personally reviewed following labs and imaging studies  CBC: Recent Labs  Lab 09/10/21 1446 09/10/21 1535 09/12/21 0052 09/13/21 0046 09/14/21 0034 09/15/21 0355 09/15/21 0603 09/15/21 2003 09/16/21 0026  WBC 11.1*   < > 7.5 8.8 9.8 10.7*  --   --  7.8  NEUTROABS 7.6  --   --   --   --   --   --   --   --   HGB 9.5*   < > 7.2* 7.2* 8.3* 6.9* 6.8* 8.7* 8.9*  HCT 29.3*   < > 21.3* 21.7* 25.2* 20.9* 20.4* 26.0* 26.5*  MCV 84.7   < > 83.9 84.8 85.7 84.6  --   --  84.4  PLT 662*   < > 470* 455* 495* 476*  --   --  474*   < > = values in this interval not displayed.   Basic Metabolic Panel: Recent Labs  Lab 09/10/21 2325 09/11/21 0651 09/11/21 0914 09/12/21 0052 09/13/21 0046 09/14/21 0034 09/15/21 0355 09/16/21 0026  NA  --    < >  --  129* 130* 132* 138 137  K 2.1*   < >  --  3.9 4.0 4.8 4.7 5.0  CL  --    < >  --  96* 97* 98 103 101  CO2  --    < >  --  25 25 24 29 29   GLUCOSE  --    < >  --  118* 148* 121* 117* 106*  BUN  --    < >  --  49* 54* 54* 49* 49*  CREATININE  --    < >  --  3.02* 2.93* 2.57* 2.41* 2.66*  CALCIUM  --    < >  --  7.7* 7.8* 8.2* 8.6* 8.6*  MG 2.7*  --   --   --   --   --   --   --   PHOS  --   --  2.8  --   --   --   --   --    < > = values in this interval not displayed.   GFR: Estimated Creatinine Clearance: 11.9 mL/min (A) (by C-G formula based on SCr of 2.66 mg/dL (H)). Liver Function Tests: Recent Labs  Lab 09/10/21 1446 09/15/21 0355  AST 47* 21  ALT 32 19  ALKPHOS 111 76  BILITOT 1.2 0.2*  PROT 6.8 5.4*  ALBUMIN 3.1* 2.2*   No results for input(s): LIPASE, AMYLASE in the last  168 hours. No results for input(s): AMMONIA in the last 168 hours. Coagulation Profile: No results for input(s): INR, PROTIME in the last 168 hours. Cardiac Enzymes: No results for input(s): CKTOTAL, CKMB, CKMBINDEX, TROPONINI in the last 168 hours. BNP (last 3 results) No results for input(s): PROBNP in the last  8760 hours. HbA1C: No results for input(s): HGBA1C in the last 72 hours. CBG: No results for input(s): GLUCAP in the last 168 hours. Lipid Profile: No results for input(s): CHOL, HDL, LDLCALC, TRIG, CHOLHDL, LDLDIRECT in the last 72 hours. Thyroid Function Tests: No results for input(s): TSH, T4TOTAL, FREET4, T3FREE, THYROIDAB in the last 72 hours. Anemia Panel: No results for input(s): VITAMINB12, FOLATE, FERRITIN, TIBC, IRON, RETICCTPCT in the last 72 hours. Sepsis Labs: No results for input(s): PROCALCITON, LATICACIDVEN in the last 168 hours.  Recent Results (from the past 240 hour(s))  Resp Panel by RT-PCR (Flu A&B, Covid) Nasopharyngeal Swab     Status: None   Collection Time: 09/10/21  2:46 PM   Specimen: Nasopharyngeal Swab; Nasopharyngeal(NP) swabs in vial transport medium  Result Value Ref Range Status   SARS Coronavirus 2 by RT PCR NEGATIVE NEGATIVE Final    Comment: (NOTE) SARS-CoV-2 target nucleic acids are NOT DETECTED.  The SARS-CoV-2 RNA is generally detectable in upper respiratory specimens during the acute phase of infection. The lowest concentration of SARS-CoV-2 viral copies this assay can detect is 138 copies/mL. A negative result does not preclude SARS-Cov-2 infection and should not be used as the sole basis for treatment or other patient management decisions. A negative result may occur with  improper specimen collection/handling, submission of specimen other than nasopharyngeal swab, presence of viral mutation(s) within the areas targeted by this assay, and inadequate number of viral copies(<138 copies/mL). A negative result must be combined  with clinical observations, patient history, and epidemiological information. The expected result is Negative.  Fact Sheet for Patients:  EntrepreneurPulse.com.au  Fact Sheet for Healthcare Providers:  IncredibleEmployment.be  This test is no t yet approved or cleared by the [place] FDA and  has been authorized for detection and/or diagnosis of SARS-CoV-2 by FDA under an Emergency Use Authorization (EUA). This EUA will remain  in effect (meaning this test can be used) for the duration of the COVID-19 declaration under Section 564(b)(1) of the Act, 21 U.S.C.section 360bbb-3(b)(1), unless the authorization is terminated  or revoked sooner.       Influenza A by PCR NEGATIVE NEGATIVE Final   Influenza B by PCR NEGATIVE NEGATIVE Final    Comment: (NOTE) The Xpert Xpress SARS-CoV-2/FLU/RSV plus assay is intended as an aid in the diagnosis of influenza from Nasopharyngeal swab specimens and should not be used as a sole basis for treatment. Nasal washings and aspirates are unacceptable for Xpert Xpress SARS-CoV-2/FLU/RSV testing.  Fact Sheet for Patients: EntrepreneurPulse.com.au  Fact Sheet for Healthcare Providers: IncredibleEmployment.be  This test is not yet approved or cleared by the [place] FDA and has been authorized for detection and/or diagnosis of SARS-CoV-2 by FDA under an Emergency Use Authorization (EUA). This EUA will remain in effect (meaning this test can be used) for the duration of the COVID-19 declaration under Section 564(b)(1) of the Act, 21 U.S.C. section 360bbb-3(b)(1), unless the authorization is terminated or revoked.  Performed at [facility], [place] [address] [place],  [address]   MRSA Next Gen by PCR, Nasal     Status: None   Collection Time: 09/11/21  4:11 AM   Specimen: Nasal Mucosa; Nasal Swab  Result Value Ref Range Status   MRSA by PCR Next Gen NOT  DETECTED NOT DETECTED Final    Comment: (NOTE) The GeneXpert MRSA Assay (FDA approved for NASAL specimens only), is one component of a comprehensive MRSA colonization surveillance program. It is not intended to diagnose  MRSA infection nor to guide or monitor treatment for MRSA infections. Test performance is not FDA approved in patients less than 75 years old. Performed at [facility], [place] [address], [place], [place] [address]      Radiology Studies: CT ABDOMEN PELVIS WO CONTRAST  Result Date: 09/15/2021 CLINICAL DATA:  Abdominal pain. EXAM: CT ABDOMEN AND PELVIS WITHOUT CONTRAST TECHNIQUE: Multidetector CT imaging of the abdomen and pelvis was performed following the standard protocol without IV contrast. COMPARISON:  06/16/2021 FINDINGS: Lower chest: Bibasilar collapse/consolidation, left greater than right. The left lower lobe collapse seen previously has decreased in the interval. Hepatobiliary: No focal abnormality in the liver on this study without intravenous contrast. There is no evidence for gallstones, gallbladder wall thickening, or pericholecystic fluid. Dilatation of the common bile duct again noted measuring up to 10 mm in the head of the pancreas, similar to prior. Pancreas: Diffuse parenchymal atrophy without appreciable main duct dilatation. Spleen: No splenomegaly. No focal mass lesion. Adrenals/Urinary Tract: No adrenal nodule or mass. Right kidney atrophic. No hydronephrosis. 14 mm low-density lesion upper interpolar left kidney is stable, likely a cyst. No evidence for hydroureter. The urinary bladder appears normal for the degree of distention. Stomach/Bowel: Stomach is decompressed. Duodenum is normally positioned as is the ligament of Treitz. No small bowel wall thickening. No small bowel dilatation. The terminal ileum is normal. The appendix is not well visualized, but there is no edema or inflammation in the region of the cecum. Diffuse wall thickening noted in the  distal descending and sigmoid colon with diverticular disease. No findings to suggest diverticulitis. Vascular/Lymphatic: There is advanced atherosclerotic calcification of the abdominal aorta without aneurysm. There is no gastrohepatic or hepatoduodenal ligament lymphadenopathy. No retroperitoneal or mesenteric lymphadenopathy. No pelvic sidewall lymphadenopathy. Fem-fem bypass graft again noted. Lesion in the right groin apparently incorporating the graft anastomosis measures 5.5 x 4.8 cm today compared to 4.8 x 4.1 cm previously. Imaging features today suggest hematoma or pseudoaneurysm Reproductive: Calcified fibroid noted in the uterus. There is no adnexal mass. Other: Small volume intraperitoneal free fluid likely although inferior pelvis some low-dose cured by beam hardening artifact from right hip replacement. Musculoskeletal: Right hip prosthesis again noted. Bones are diffusely demineralized. Lumbar fusion hardware evident. IMPRESSION: 1. Diffuse wall thickening in the distal descending and sigmoid colon with diverticular disease. No findings to suggest diverticulitis. 2. 5.5 x 4.8 cm lesion in the right groin apparently incorporating the graft anastomosis. Imaging features today suggests hematoma or pseudoaneurysm. 3. Small volume intraperitoneal free fluid. 4. Bibasilar collapse/consolidation, left greater than right. The left lower lobe collapse seen previously has improved in the interval. 5. Stable dilatation of the common bile duct. 6. Aortic Atherosclerosis (ICD10-I70.0). Electronically Signed   By: Misty Stanley M.D.   On: 09/15/2021 09:52    Scheduled Meds:  amLODipine  5 mg Oral Daily   aspirin EC  81 mg Oral Daily   carvedilol  12.5 mg Oral BID   clopidogrel  75 mg Oral Daily   cyclobenzaprine  5 mg Oral TID   dicyclomine  20 mg Oral TID   docusate sodium  100 mg Oral BID   feeding supplement (NEPRO CARB STEADY)  237 mL Oral BID BM   fluticasone furoate-vilanterol  1 puff Inhalation  Daily   And   umeclidinium bromide  1 puff Inhalation Daily   heparin injection (subcutaneous)  5,000 Units Subcutaneous Q8H   hydrOXYzine  25 mg Oral QHS   megestrol  40  mg Oral BID   multivitamin  1 tablet Oral QHS   pantoprazole  40 mg Oral Daily   sodium bicarbonate  1,300 mg Oral BID   sodium chloride flush  3 mL Intravenous Q12H   Continuous Infusions:  sodium chloride Stopped (09/12/21 1206)     LOS: 4 days   Time spent: 104min  Rifky Lapre C Terius Jacuinde, DO [facility]  If 7PM-7AM, please contact night-coverage [email or web address]  09/16/2021, 7:21 AM

## 2021-09-16 NOTE — Social Work (Incomplete)
CSW spoke with pt and pt son about making outpatient appointments. Pt son states that he takes pt to all appointments and not sure what appointments they have missed other than her being here in the hospital.

## 2021-09-16 NOTE — Progress Notes (Signed)
Mobility Specialist Progress Note    09/16/21 1408  Mobility  Activity Ambulated in room  Level of Assistance Standby assist, set-up cues, supervision of patient - no hands on  Assistive Device Front wheel walker  Distance Ambulated (ft) 5 ft  Mobility Ambulated with assistance in room  Mobility Response Tolerated fair  Mobility performed by Mobility specialist  $Mobility charge 1 Mobility   Post-Mobility: 98 HR, 178/100 BP  Pt received in bed and grimacing in pain. Convinced pt to get up. Supervision to sit EOB but MinA to stand. Pt frustrated and kept eyes closed while attempting to ambulate then return to bed. All completed on RA. Left in bed with call bell in reach.   [facility] Mobility Specialist  M.S. Primary Phone: [phone] M.S. Secondary Phone: [phone]

## 2021-09-16 NOTE — TOC Progression Note (Addendum)
Transition of Care [facility]) - Progression Note    Patient Details  Name: Alyssa [name] MRN: [id number] Date of Birth: 11/21/1940  Transition of Care [facility]) CM/SW Contact  Zenon Mayo, RN Phone Number: 09/16/2021, 2:18 PM  Clinical Narrative:    NCM spoke with patient at bedside, was informed by CSW that patient wants to go home with [facility] services.  NCM offered choice, she has no preference for the angency.  NCM made referral to Angie with [facility] for [facility], [place], [place] and Social Work,  she is able to take referral , soc will begin 24 to 48 hrs post dc. she states she has a bath chair, bsc and walker at home.  NCM asked if she would need ambulance transport home at dc she states we will worry about when we get to that point. Patient gave NCM permission to call her son , [name].  NCM spoke with [name] and informed him she may be dc tomorrow, he states he will transport her home or he will have a good friend to transport her home tomorrow.    Expected Discharge Plan: Skilled Nursing Facility Barriers to Discharge: Continued Medical Work up  Expected Discharge Plan and Services Expected Discharge Plan: [place] In-house Referral: Clinical Social Work Discharge Planning Services: CM Consult Post Acute Care Choice: [place] arrangements for the past 2 months: Single Family Home                   DME Agency: NA       HH Arranged: RN, PT, OT, Nurse's Aide, Social Work [employer] Agency: [facility] Date [place]: 09/16/21 Time [place]: [place] Representative spoke with at [place]: [place] ([place]) Interventions    Readmission Risk Interventions Readmission Risk Prevention Plan 07/10/2020 12/26/2018  Transportation Screening Complete Complete  Medication Review Press photographer) Complete Complete  PCP or Specialist appointment within 3-5 days of discharge - Not Complete  PCP/Specialist  Appt Not Complete comments - pt for snf. Snf MD to see as needed.  [place] or Home Care Consult Complete Not Complete  HRI or Home Care Consult Pt Refusal Comments - NA  SW Recovery Care/Counseling Consult Complete Not Complete  SW Consult Not Complete Comments - NA  Palliative Care Screening Not Applicable Complete  Skilled Nursing Facility Not Applicable Complete  Some recent data might be hidden

## 2021-09-16 NOTE — Progress Notes (Addendum)
Progress Note    09/16/2021 8:49 AM * No surgery found *  Subjective: Complaining of abdominal and back pain.   Vitals:   09/16/21 0315 09/16/21 0727  BP: (!) 148/84 (!) 144/99  Pulse: 79 86  Resp: 12 17  Temp: 98.5 F (36.9 C) 98.6 F (37 C)  SpO2: 100% 100%    Physical Exam:  General appearance: Awake, alert in no apparent distress Cardiac: Heart rate and rhythm are regular Respirations: Nonlabored Abdomen: Soft, nondistended Extremities: Both feet are warm with intact sensation and motor function.  Pulsatile mass of right groin.   Pulse/Doppler exam: Palpable pulse in femorofemoral bypass.   CBC    Component Value Date/Time   WBC 7.8 09/16/2021 0026   RBC 3.14 (L) 09/16/2021 0026   HGB 8.9 (L) 09/16/2021 0026   HCT 26.5 (L) 09/16/2021 0026   PLT 474 (H) 09/16/2021 0026   MCV 84.4 09/16/2021 0026   MCH 28.3 09/16/2021 0026   MCHC 33.6 09/16/2021 0026   RDW 17.3 (H) 09/16/2021 0026   LYMPHSABS 2.6 09/10/2021 1446   MONOABS 0.7 09/10/2021 1446   EOSABS 0.1 09/10/2021 1446   BASOSABS 0.1 09/10/2021 1446    BMET    Component Value Date/Time   NA 137 09/16/2021 0026   K 5.0 09/16/2021 0026   CL 101 09/16/2021 0026   CO2 29 09/16/2021 0026   GLUCOSE 106 (H) 09/16/2021 0026   BUN 49 (H) 09/16/2021 0026   CREATININE 2.66 (H) 09/16/2021 0026   CALCIUM 8.6 (L) 09/16/2021 0026   GFRNONAA 18 (L) 09/16/2021 0026   GFRAA 30 (L) 07/10/2020 0137     Intake/Output Summary (Last 24 hours) at 09/16/2021 0849 Last data filed at 09/16/2021 0749 Gross per 24 hour  Intake 932.5 ml  Output --  Net 932.5 ml    HOSPITAL MEDICATIONS Scheduled Meds:  amLODipine  5 mg Oral Daily   aspirin EC  81 mg Oral Daily   carvedilol  12.5 mg Oral BID   clopidogrel  75 mg Oral Daily   cyclobenzaprine  5 mg Oral TID   dicyclomine  20 mg Oral TID   docusate sodium  100 mg Oral BID   feeding supplement (NEPRO CARB STEADY)  237 mL Oral BID BM   fluticasone  furoate-vilanterol  1 puff Inhalation Daily   And   umeclidinium bromide  1 puff Inhalation Daily   heparin injection (subcutaneous)  5,000 Units Subcutaneous Q8H   hydrOXYzine  25 mg Oral QHS   megestrol  40 mg Oral BID   multivitamin  1 tablet Oral QHS   pantoprazole  40 mg Oral Daily   sodium bicarbonate  1,300 mg Oral BID   sodium chloride flush  3 mL Intravenous Q12H   Continuous Infusions:  sodium chloride Stopped (09/12/21 1206)   PRN Meds:.acetaminophen **OR** acetaminophen, albuterol, calcium carbonate (dosed in mg elemental calcium), camphor-menthol **AND** hydrOXYzine, docusate sodium, feeding supplement (NEPRO CARB STEADY), hydrALAZINE, iohexol, ondansetron **OR** ondansetron (ZOFRAN) IV, oxyCODONE, sorbitol, zolpidem  Assessment and Plan: 80 year old female admitted secondary to hypertensive crisis and shortness of breath.  She has a chronic right femoral pseudoaneurysm that is likely from the anastomosis of her left to right femoral to femoral bypass graft placed by Dr. Oneida Alar for rest pain in 2011. Physical exam unchanged.  Continue medical management. Plan will be to repair this pseudoaneurysm once she meets medical and cardiology clearance, likely as outpatient.  Discussed with DR. [place]>>likely dc tomorrow. Will make arrangements for short term  follow-up with Dr. Trula Slade  -DVT prophylaxis: Heparin subcutaneously   Risa Grill, PA-C [facility] [phone] 09/16/2021  8:49 AM .   I agree with the above.  I have seen and evaluated the patient.  She has been cleared to proceed with surgery by cardiology.  Her right femoral pseudoaneurysm remains nontender.  I discussed that this would be repaired electively in approximately 2 weeks.  She can be discharged home from my perspective.  Annamarie Major

## 2021-09-16 NOTE — Progress Notes (Signed)
Nutrition Follow-up  DOCUMENTATION CODES:   Severe malnutrition in context of chronic illness, Underweight  INTERVENTION:   - Recommend increasing bowel regimen as last documented BM was on 11/25  - Continue Nepro Shake po BID, each supplement provides 425 kcal and 19 grams protein  - Continue renal MVI  - Continue Regular diet  NUTRITION DIAGNOSIS:   Severe Malnutrition related to chronic illness as evidenced by severe fat depletion, severe muscle depletion.  Ongoing, being addressed via oral nutrition supplements  GOAL:   Patient will meet greater than or equal to 90% of their needs  Progressing  MONITOR:   PO intake, Supplement acceptance, Labs, Weight trends  REASON FOR ASSESSMENT:   Consult Assessment of nutrition requirement/status  ASSESSMENT:   80 yo female admitted with SOB, hypertensive crisis. PMH includes CKD IV, HTN, HLD, PVD.  RD working remotely. No new weights available since admission. Noted last documented BM was on 11/25. Recommend increasing bowel regimen.  Unable to reach pt via phone call to room. Pt with ~70% meal completions on average. Per MAR, pt accepting most Nepro shake supplements. Will continue with current supplement regimen.  Meal Completion: 30-100% x last 8 documented meals (averaging 70%)  Medications reviewed and include: bentyl, colace, Nepro BID, megace 40 mg BID, rena-vit, protonix, sodium bicarb 1300 mg BID  Labs reviewed: BUN 49, creatinine 2.66, hemoglobin 8.9  I/O's: +3.6 L since admit  Diet Order:   Diet Order             Diet regular Room service appropriate? Yes; Fluid consistency: Thin  Diet effective now                   EDUCATION NEEDS:   Not appropriate for education at this time  Skin:  Skin Assessment: Reviewed RN Assessment  Last BM:  09/11/21  Height:   Ht Readings from Last 1 Encounters:  09/10/21 5\' 3"  (1.6 m)    Weight:   Wt Readings from Last 1 Encounters:  09/10/21 43.1 kg     BMI:  Body mass index is 16.83 kg/m.  Estimated Nutritional Needs:   Kcal:  1500-1700 kcals  Protein:  65-75 grams  Fluid:  >/= 1.5 L    Gustavus Bryant, MS, RD, LDN Inpatient Clinical Dietitian Please see AMiON for contact information.

## 2021-09-16 NOTE — Progress Notes (Signed)
Progress Note  [name] Date of Encounter: 09/16/2021  [facility] Cardiologist: Shelva Majestic, MD   Subjective   Reports abdominal pain.  Inpatient Medications    Scheduled Meds:  [START ON 09/17/2021] amLODipine  10 mg Oral Daily   aspirin EC  81 mg Oral Daily   carvedilol  12.5 mg Oral BID   clopidogrel  75 mg Oral Daily   dicyclomine  20 mg Oral TID   docusate sodium  100 mg Oral BID   feeding supplement (NEPRO CARB STEADY)  237 mL Oral BID BM   fluticasone furoate-vilanterol  1 puff Inhalation Daily   And   umeclidinium bromide  1 puff Inhalation Daily   heparin injection (subcutaneous)  5,000 Units Subcutaneous Q8H   hydrOXYzine  25 mg Oral QHS   megestrol  40 mg Oral BID   multivitamin  1 tablet Oral QHS   pantoprazole  40 mg Oral Daily   sodium bicarbonate  1,300 mg Oral BID   sodium chloride flush  3 mL Intravenous Q12H   Continuous Infusions:  sodium chloride Stopped (09/12/21 1206)   PRN Meds: acetaminophen **OR** acetaminophen, albuterol, calcium carbonate (dosed in mg elemental calcium), camphor-menthol **AND** hydrOXYzine, docusate sodium, hydrALAZINE, iohexol, ondansetron **OR** ondansetron (ZOFRAN) IV, oxyCODONE, sorbitol, zolpidem   Vital Signs    Vitals:   09/16/21 0727 09/16/21 0938 09/16/21 1141 09/16/21 1402  BP: (!) 144/99 (!) 158/85 (!) 168/90 (!) 178/100  Pulse: 86  100 (!) 101  Resp: 17  (!) 21 (!) 35  Temp: 98.6 F (37 C)  98.7 F (37.1 C)   TempSrc: Oral  Oral   SpO2: 100%  95% 99%  Weight:      Height:        Intake/Output Summary (Last 24 hours) at 09/16/2021 1546 Last data filed at 09/16/2021 1151 Gross per 24 hour  Intake 932.5 ml  Output --  Net 932.5 ml   Last 3 Weights 09/10/2021 08/07/2021 07/23/2021  Weight (lbs) 95 lb 0.3 oz 95 lb 97 lb  Weight (kg) 43.1 kg 43.092 kg 43.999 kg      Telemetry    NSR - Personally Reviewed  ECG      Physical Exam   GEN: No acute distress. Frail   Neck: No JVD Cardiac: RRR, no murmurs, rubs, or gallops.  Respiratory: no wheezing GI: Mildly tender MS: No edema; No deformity. Neuro:  Nonfocal  Psych: Normal affect   Labs    High Sensitivity Troponin:   Recent Labs  Lab 09/10/21 1446 09/10/21 1635  TROPONINIHS 188* 169*     Chemistry Recent Labs  Lab 09/10/21 1446 09/10/21 1535 09/10/21 2325 09/11/21 0651 09/14/21 0034 09/15/21 0355 09/16/21 0026  NA 139   < >  --    < > 132* 138 137  K 2.3*   < > 2.1*   < > 4.8 4.7 5.0  CL 99  --   --    < > 98 103 101  CO2 18*  --   --    < > 24 29 29   GLUCOSE 72  --   --    < > 121* 117* 106*  BUN 35*  --   --    < > 54* 49* 49*  CREATININE 2.78*  --   --    < > 2.57* 2.41* 2.66*  CALCIUM 8.9  --   --    < > 8.2* 8.6* 8.6*  MG  --   --  2.7*  --   --   --   --   PROT 6.8  --   --   --   --  5.4*  --   ALBUMIN 3.1*  --   --   --   --  2.2*  --   AST 47*  --   --   --   --  21  --   ALT 32  --   --   --   --  19  --   ALKPHOS 111  --   --   --   --  76  --   BILITOT 1.2  --   --   --   --  0.2*  --   GFRNONAA 17*  --   --    < > 19* 20* 18*  ANIONGAP 22*  --   --    < > 10 6 7    < > = values in this interval not displayed.    Lipids No results for input(s): CHOL, TRIG, HDL, LABVLDL, LDLCALC, CHOLHDL in the last 168 hours.  Hematology Recent Labs  Lab 09/14/21 0034 09/15/21 0355 09/15/21 0603 09/15/21 2003 09/16/21 0026  WBC 9.8 10.7*  --   --  7.8  RBC 2.94* 2.47*  --   --  3.14*  HGB 8.3* 6.9* 6.8* 8.7* 8.9*  HCT 25.2* 20.9* 20.4* 26.0* 26.5*  MCV 85.7 84.6  --   --  84.4  MCH 28.2 27.9  --   --  28.3  MCHC 32.9 33.0  --   --  33.6  RDW 18.5* 18.4*  --   --  17.3*  PLT 495* 476*  --   --  474*   Thyroid No results for input(s): TSH, FREET4 in the last 168 hours.  BNP Recent Labs  Lab 09/10/21 1446  BNP 1,006.5*    DDimer No results for input(s): DDIMER in the last 168 hours.   Radiology    CT ABDOMEN PELVIS WO CONTRAST  Result Date:  09/15/2021 CLINICAL DATA:  Abdominal pain. EXAM: CT ABDOMEN AND PELVIS WITHOUT CONTRAST TECHNIQUE: Multidetector CT imaging of the abdomen and pelvis was performed following the standard protocol without IV contrast. COMPARISON:  06/16/2021 FINDINGS: Lower chest: Bibasilar collapse/consolidation, left greater than right. The left lower lobe collapse seen previously has decreased in the interval. Hepatobiliary: No focal abnormality in the liver on this study without intravenous contrast. There is no evidence for gallstones, gallbladder wall thickening, or pericholecystic fluid. Dilatation of the common bile duct again noted measuring up to 10 mm in the head of the pancreas, similar to prior. Pancreas: Diffuse parenchymal atrophy without appreciable main duct dilatation. Spleen: No splenomegaly. No focal mass lesion. Adrenals/Urinary Tract: No adrenal nodule or mass. Right kidney atrophic. No hydronephrosis. 14 mm low-density lesion upper interpolar left kidney is stable, likely a cyst. No evidence for hydroureter. The urinary bladder appears normal for the degree of distention. Stomach/Bowel: Stomach is decompressed. Duodenum is normally positioned as is the ligament of Treitz. No small bowel wall thickening. No small bowel dilatation. The terminal ileum is normal. The appendix is not well visualized, but there is no edema or inflammation in the region of the cecum. Diffuse wall thickening noted in the distal descending and sigmoid colon with diverticular disease. No findings to suggest diverticulitis. Vascular/Lymphatic: There is advanced atherosclerotic calcification of the abdominal aorta without aneurysm. There is no gastrohepatic or hepatoduodenal ligament lymphadenopathy. No retroperitoneal or mesenteric lymphadenopathy. No pelvic  sidewall lymphadenopathy. Fem-fem bypass graft again noted. Lesion in the right groin apparently incorporating the graft anastomosis measures 5.5 x 4.8 cm today compared to 4.8 x 4.1  cm previously. Imaging features today suggest hematoma or pseudoaneurysm Reproductive: Calcified fibroid noted in the uterus. There is no adnexal mass. Other: Small volume intraperitoneal free fluid likely although inferior pelvis some low-dose cured by beam hardening artifact from right hip replacement. Musculoskeletal: Right hip prosthesis again noted. Bones are diffusely demineralized. Lumbar fusion hardware evident. IMPRESSION: 1. Diffuse wall thickening in the distal descending and sigmoid colon with diverticular disease. No findings to suggest diverticulitis. 2. 5.5 x 4.8 cm lesion in the right groin apparently incorporating the graft anastomosis. Imaging features today suggests hematoma or pseudoaneurysm. 3. Small volume intraperitoneal free fluid. 4. Bibasilar collapse/consolidation, left greater than right. The left lower lobe collapse seen previously has improved in the interval. 5. Stable dilatation of the common bile duct. 6. Aortic Atherosclerosis (ICD10-I70.0). Electronically Signed   By: Misty Stanley M.D.   On: 09/15/2021 09:52    Cardiac Studies   Normal LVEF, normal valvular function in 08/2021  Patient Profile     80 y.o. female 80 y.o. female with a hx of HTN, HLD, PAD w/ carotid dz and s/p L>R  Fem-fem 2011, CKD IV, elevated trop, periph neuropathy, dementia, 3 week admit 06/2021 for acute  respiratory failure associated with LLL collapse with COPD and active herpes zoster infection, abnl ECG  Assessment & Plan    Elevated troponin: This occurred in the setting of hypertensive urgency.  No LV dysfunction and echo.  Continue with medical therapy for blood pressure.   Preoperative cardiovascular exam: No further cardiac testing required before surgery.  She is not a candidate for elective cardiac cath and she is not reporting angina or symptoms of heart failure.  Continue medical management of blood pressure.  Given her age and comorbidities, she is at moderate risk of cardiovascular  complication given renal insufficiency and PAD, but we cannot decrease that risk with any preoperative testing.     For questions or updates, please contact [place] Please consult [email or web address] for contact info under        Signed, Larae Grooms, MD  09/16/2021, 3:46 PM

## 2021-09-17 ENCOUNTER — Other Ambulatory Visit (HOSPITAL_COMMUNITY): Payer: Self-pay

## 2021-09-17 MED ORDER — CLONIDINE HCL 0.1 MG PO TABS
0.1000 mg | ORAL_TABLET | Freq: Two times a day (BID) | ORAL | 1 refills | Status: DC
  Filled 2021-09-17: qty 60, 30d supply, fill #0

## 2021-09-17 MED ORDER — CARVEDILOL 12.5 MG PO TABS
12.5000 mg | ORAL_TABLET | Freq: Two times a day (BID) | ORAL | 1 refills | Status: DC
  Filled 2021-09-17: qty 60, 30d supply, fill #0

## 2021-09-17 MED ORDER — OXYCODONE HCL 5 MG PO TABS
5.0000 mg | ORAL_TABLET | ORAL | 0 refills | Status: AC | PRN
  Filled 2021-09-17: qty 18, 3d supply, fill #0

## 2021-09-17 MED ORDER — POLYETHYLENE GLYCOL 3350 17 G PO PACK: 17.0000 g | PACK | Freq: Every day | ORAL | Status: DC

## 2021-09-17 MED ORDER — AMLODIPINE BESYLATE 10 MG PO TABS
10.0000 mg | ORAL_TABLET | Freq: Every day | ORAL | 0 refills | Status: DC
Start: 2021-09-18 — End: 2021-11-17
  Filled 2021-09-17: qty 30, 30d supply, fill #0

## 2021-09-17 NOTE — Discharge Summary (Signed)
Physician Discharge Summary  AVIGAYIL TON YBO:[id number] DOB: 1941/02/26 DOA: 09/10/2021  PCP: System, Provider Not In  Admit date: 09/10/2021 Discharge date: 09/17/2021  Admitted From: Home Disposition: Home  Recommendations for Outpatient Follow-up:  Follow up with PCP in 1-2 weeks Please obtain BMP/CBC in one week Please follow up with vascular surgery as scheduled  Home Health: None Equipment/Devices: None  Discharge Condition: Stable CODE STATUS: DNR low-salt low-fat low-carb diet Diet recommendation:    Brief/Interim Summary: 80 year old female with medical history of hypertension, hyperlipidemia, PVD, stage IV CKD, COPD, dementia and prior prolonged admission from 8/29-9 /23, acute respiratory failure associated with LLL collapse with COPD and active herpes infection.  She reports 2 to 3 days of mostly shortness of breath with mild left-sided chest discomfort. VQ scan was ordered to rule out pulmonary embolism, which turned out to be negative for PE. BNP was elevated to 1000 but did not appear volume overloaded.  She was found to be in hypertensive urgency.  Patient was given labetalol for elevated blood pressure.   Assessment & Plan:   Hypertensive emergency with associated chest pain/dyspnea, resolved -Likely in setting of hypertensive crisis -Patient was started  empirically on heparin GTT for possible PE/ACS -VQ scan was negative for PE, cardiology has ruled out ACS -Mild elevation of troponin was likely from demand ischemia -No further intervention planned per cardiology -currently signed off -Heparin was discontinued  -Echocardiogram was ordered, no evidence of systolic heart failure on echo. -Blood pressure improving, continue clonidine, amlodipine, carvedilol -will likely need ongoing titration in the outpatient setting, concern for medication noncompliance is also quite high   Left upper quadrant abdominal pain, resolved -Unclear etiology -CT  abdomen/pelvis obtained, results as above -No clear findings to suggest etiology for left upper quadrant pain. -Likely muscular as pain is worse on movement -continue pain regimen at discharge for 3 days, if ongoing will need to see PCP for further evaluation and treatment   Pseudoaneurysm versus hematoma -Incidental finding on CT abdomen/pelvis as above -CT scan shows 5.5 x 4.8 cm lesion in the right groin apparently incorporating the graft anastomosis -Imaging suggest hematoma versus pseudoaneurysm -Patient was seen by vascular surgery in September 2022 for the same and was supposed to follow-up with vascular surgery as outpatient for repair of pseudoaneurysm -Vascular surgical follow-up in the outpatient setting.    Acute kidney injury on stage IV CKD, resolved - Patient's baseline creatinine is around 2.75-2.85   Peripheral vascular disease -Continue aspirin, Plavix   Hypokalemia -Resolved   Dementia -Continue Aricept, duloxetine, hydroxyzine   Malnutrition -BMI 16.83 kg per metered square -Likely from poor p.o. intake -Dietitian consulted   Chronic anemia of chronic disease -Hemoglobin resolved after 1 unit PRBC   Discharge Instructions  Discharge Instructions     Discharge patient   Complete by: As directed    Discharge disposition: 01-Home or Self Care   Discharge patient date: 09/17/2021      Allergies as of 09/17/2021   No Known Allergies      Medication List     STOP taking these medications    DULoxetine HCl 40 MG Cpep   gabapentin 100 MG capsule Commonly known as: NEURONTIN   ondansetron 4 MG disintegrating tablet Commonly known as: Zofran ODT       TAKE these medications    acetaminophen 650 MG CR tablet Commonly known as: TYLENOL Take 650 mg by mouth daily.   albuterol 108 (90 Base) MCG/ACT inhaler Commonly known as: VENTOLIN HFA  Inhale 2 puffs into the lungs every 4 (four) hours as needed for wheezing or shortness of breath.    amitriptyline 10 MG tablet Commonly known as: ELAVIL Take 10 mg by mouth daily.   amLODipine 10 MG tablet Commonly known as: NORVASC Take 1 tablet (10 mg total) by mouth daily. Start taking on: September 18, 2021 What changed: when to take this   aspirin 81 MG EC tablet Take 1 tablet (81 mg total) by mouth daily.   Breztri Aerosphere 160-9-4.8 MCG/ACT Aero Generic drug: Budeson-Glycopyrrol-Formoterol Inhale 2 puffs into the lungs 2 (two) times daily.   carvedilol 12.5 MG tablet Commonly known as: COREG Take 1 tablet (12.5 mg total) by mouth 2 (two) times daily.   cloNIDine 0.1 MG tablet Commonly known as: CATAPRES Take 1 tablet (0.1 mg total) by mouth 2 (two) times daily. What changed:  medication strength how much to take when to take this   clopidogrel 75 MG tablet Commonly known as: PLAVIX Take 1 tablet (75 mg total) by mouth daily.   diclofenac Sodium 1 % Gel Commonly known as: VOLTAREN Apply 2 g topically every 6 (six) hours as needed (knee pain).   dicyclomine 20 MG tablet Commonly known as: BENTYL Take 20 mg by mouth 3 (three) times daily.   docusate sodium 100 MG capsule Commonly known as: COLACE Take 1 capsule (100 mg total) by mouth daily.   donepezil 5 MG tablet Commonly known as: ARICEPT Take 1 tablet (5 mg total) by mouth daily.   Ensure Plus Liqd Take 237 mLs by mouth daily.   Hair/Skin/Nails Tabs Take 1 tablet by mouth daily.   hydrOXYzine 25 MG tablet Commonly known as: ATARAX Take 25 mg by mouth at bedtime.   Iron High-Potency 325 MG Tabs Take 325 mg by mouth daily with breakfast. What changed: when to take this   lidocaine 5 % Commonly known as: LIDODERM Place 1 patch onto the skin daily.   magnesium oxide 400 MG tablet Commonly known as: MAG-OX Take 400 mg by mouth 2 (two) times daily.   megestrol 40 MG tablet Commonly known as: MEGACE Take 40 mg by mouth 2 (two) times daily.   methocarbamol 500 MG tablet Commonly known  as: ROBAXIN Take 500 mg by mouth 3 (three) times daily.   nitroGLYCERIN 0.4 MG SL tablet Commonly known as: NITROSTAT Place 1 tablet (0.4 mg total) under the tongue every 5 (five) minutes as needed for chest pain.   oxyCODONE 5 MG immediate release tablet Commonly known as: Oxy IR/ROXICODONE Take 1 tablet (5 mg total) by mouth every 4 (four) hours as needed for up to 3 days for moderate pain.   pantoprazole 40 MG tablet Commonly known as: PROTONIX Take 1 tablet (40 mg total) by mouth daily.   pregabalin 75 MG capsule Commonly known as: LYRICA Take 1 capsule (75 mg total) by mouth daily.   sodium bicarbonate 650 MG tablet Take 2 tablets (1,300 mg total) by mouth 2 (two) times daily.   valproic acid 250 MG capsule Commonly known as: DEPAKENE Take 250 mg by mouth at bedtime.        Follow-up Information     Serafina Mitchell, MD Follow up.   Specialties: Vascular Surgery, Cardiology Why: office will call you to arrange your appt (sent) Contact information: [place] [address] [phone]         Troy Sine, MD .   Specialty: Cardiology Contact information: [address]  [place] [address] [phone]                No Known Allergies  Consultations: Vascular surgery, cardiology  Procedures/Studies: CT ABDOMEN PELVIS WO CONTRAST  Result Date: 09/15/2021 CLINICAL DATA:  Abdominal pain. EXAM: CT ABDOMEN AND PELVIS WITHOUT CONTRAST TECHNIQUE: Multidetector CT imaging of the abdomen and pelvis was performed following the standard protocol without IV contrast. COMPARISON:  06/16/2021 FINDINGS: Lower chest: Bibasilar collapse/consolidation, left greater than right. The left lower lobe collapse seen previously has decreased in the interval. Hepatobiliary: No focal abnormality in the liver on this study without intravenous contrast. There is no evidence for gallstones, gallbladder wall thickening, or pericholecystic fluid.  Dilatation of the common bile duct again noted measuring up to 10 mm in the head of the pancreas, similar to prior. Pancreas: Diffuse parenchymal atrophy without appreciable main duct dilatation. Spleen: No splenomegaly. No focal mass lesion. Adrenals/Urinary Tract: No adrenal nodule or mass. Right kidney atrophic. No hydronephrosis. 14 mm low-density lesion upper interpolar left kidney is stable, likely a cyst. No evidence for hydroureter. The urinary bladder appears normal for the degree of distention. Stomach/Bowel: Stomach is decompressed. Duodenum is normally positioned as is the ligament of Treitz. No small bowel wall thickening. No small bowel dilatation. The terminal ileum is normal. The appendix is not well visualized, but there is no edema or inflammation in the region of the cecum. Diffuse wall thickening noted in the distal descending and sigmoid colon with diverticular disease. No findings to suggest diverticulitis. Vascular/Lymphatic: There is advanced atherosclerotic calcification of the abdominal aorta without aneurysm. There is no gastrohepatic or hepatoduodenal ligament lymphadenopathy. No retroperitoneal or mesenteric lymphadenopathy. No pelvic sidewall lymphadenopathy. Fem-fem bypass graft again noted. Lesion in the right groin apparently incorporating the graft anastomosis measures 5.5 x 4.8 cm today compared to 4.8 x 4.1 cm previously. Imaging features today suggest hematoma or pseudoaneurysm Reproductive: Calcified fibroid noted in the uterus. There is no adnexal mass. Other: Small volume intraperitoneal free fluid likely although inferior pelvis some low-dose cured by beam hardening artifact from right hip replacement. Musculoskeletal: Right hip prosthesis again noted. Bones are diffusely demineralized. Lumbar fusion hardware evident. IMPRESSION: 1. Diffuse wall thickening in the distal descending and sigmoid colon with diverticular disease. No findings to suggest diverticulitis. 2. 5.5 x 4.8  cm lesion in the right groin apparently incorporating the graft anastomosis. Imaging features today suggests hematoma or pseudoaneurysm. 3. Small volume intraperitoneal free fluid. 4. Bibasilar collapse/consolidation, left greater than right. The left lower lobe collapse seen previously has improved in the interval. 5. Stable dilatation of the common bile duct. 6. Aortic Atherosclerosis (ICD10-I70.0). Electronically Signed   By: Misty Stanley M.D.   On: 09/15/2021 09:52   NM Pulmonary Perfusion  Result Date: 09/10/2021 CLINICAL DATA:  COPD, dementia, short of breath and left-sided chest discomfort EXAM: NUCLEAR MEDICINE PERFUSION LUNG SCAN TECHNIQUE: Perfusion images were obtained in multiple projections after intravenous injection of radiopharmaceutical. Ventilation scans intentionally deferred if perfusion scan and chest x-ray adequate for interpretation during COVID 19 epidemic. RADIOPHARMACEUTICALS:  3.8 mCi Tc-24m MAA IV COMPARISON:  09/10/2021 FINDINGS: Planar images of the chest were obtained in multiple projections during the perfusion examination. Normal distribution of radiotracer is seen throughout the lungs. There are no wedge-shaped perfusion defects to suggest pulmonary embolus. IMPRESSION: 1. No evidence of pulmonary embolus. Electronically Signed   By: Randa Ngo M.D.   On: 09/10/2021 19:55   DG Chest Portable 1 View  Result Date: 09/10/2021 CLINICAL  DATA:  Chest pain and shortness of breath. EXAM: PORTABLE CHEST 1 VIEW COMPARISON:  July 25, 2021 FINDINGS: The heart size and mediastinal contours are stable. The aorta is tortuous. Both lungs are clear. The visualized skeletal structures are stable. IMPRESSION: No active disease. Electronically Signed   By: Abelardo Diesel M.D.   On: 09/10/2021 15:31   ECHOCARDIOGRAM COMPLETE  Result Date: 09/13/2021    ECHOCARDIOGRAM REPORT   Patient Name:   ROSI SECRIST Date of Exam: 09/13/2021 Medical Rec #:  [id number]            Height:        63.0 in Accession #:    [id number]           Weight:       95.0 lb Date of Birth:  1941-01-19           BSA:          1.409 m Patient Age:    80 years             BP:           121/73 mmHg Patient Gender: F                    HR:           84 bpm. Exam Location:  Inpatient Procedure: 2D Echo Indications:    chest pain  History:        Patient has prior history of Echocardiogram examinations, most                 recent 05/29/2020. Chronic kidney disease. and COPD,                 Signs/Symptoms:Shortness of Breath; Risk Factors:Hypertension.  Sonographer:    Johny Chess RDCS Referring Phys: [place]  1. Left ventricular ejection fraction, by estimation, is 60 to 65%. The left ventricle has normal function. The left ventricle has no regional wall motion abnormalities. There is moderate concentric left ventricular hypertrophy. Left ventricular diastolic parameters are consistent with Grade I diastolic dysfunction (impaired relaxation).  2. Right ventricular systolic function is normal. The right ventricular size is normal. There is normal pulmonary artery systolic pressure. The estimated right ventricular systolic pressure is 71.2 mmHg.  3. The mitral valve is grossly normal. Trivial mitral valve regurgitation. No evidence of mitral stenosis.  4. The aortic valve is tricuspid. There is mild calcification of the aortic valve. There is mild thickening of the aortic valve. Aortic valve regurgitation is trivial. Aortic valve sclerosis is present, with no evidence of aortic valve stenosis.  5. The inferior vena cava is normal in size with greater than 50% respiratory variability, suggesting right atrial pressure of 3 mmHg. Comparison(s): Changes from prior study are noted. EF unchanged. LVH appears moderate on this study. FINDINGS  Left Ventricle: Left ventricular ejection fraction, by estimation, is 60 to 65%. The left ventricle has normal function. The left ventricle has no regional wall  motion abnormalities. The left ventricular internal cavity size was normal in size. There is  moderate concentric left ventricular hypertrophy. Left ventricular diastolic parameters are consistent with Grade I diastolic dysfunction (impaired relaxation). Right Ventricle: The right ventricular size is normal. No increase in right ventricular wall thickness. Right ventricular systolic function is normal. There is normal pulmonary artery systolic pressure. The tricuspid regurgitant velocity is 2.47 m/s, and  with an assumed right atrial pressure of 3 mmHg, the estimated  right ventricular systolic pressure is 60.1 mmHg. Left Atrium: Left atrial size was normal in size. Right Atrium: Right atrial size was normal in size. Pericardium: Trivial pericardial effusion is present. Mitral Valve: The mitral valve is grossly normal. Trivial mitral valve regurgitation. No evidence of mitral valve stenosis. Tricuspid Valve: The tricuspid valve is grossly normal. Tricuspid valve regurgitation is trivial. No evidence of tricuspid stenosis. Aortic Valve: The aortic valve is tricuspid. There is mild calcification of the aortic valve. There is mild thickening of the aortic valve. Aortic valve regurgitation is trivial. Aortic valve sclerosis is present, with no evidence of aortic valve stenosis. Pulmonic Valve: The pulmonic valve was grossly normal. Pulmonic valve regurgitation is trivial. No evidence of pulmonic stenosis. Aorta: The aortic root and ascending aorta are structurally normal, with no evidence of dilitation. Venous: The inferior vena cava is normal in size with greater than 50% respiratory variability, suggesting right atrial pressure of 3 mmHg. IAS/Shunts: The atrial septum is grossly normal.  LEFT VENTRICLE PLAX 2D LVIDd:         3.80 cm   Diastology LVIDs:         2.80 cm   LV e' medial:    6.53 cm/s LV PW:         1.38 cm   LV E/e' medial:  12.1 LV IVS:        1.37 cm   LV e' lateral:   8.27 cm/s LVOT diam:     1.90 cm    LV E/e' lateral: 9.6 LV SV:         50 LV SV Index:   36 LVOT Area:     2.84 cm  RIGHT VENTRICLE            IVC RV S prime:     9.90 cm/s  IVC diam: 1.20 cm TAPSE (M-mode): 1.5 cm LEFT ATRIUM             Index        RIGHT ATRIUM           Index LA diam:        3.80 cm 2.70 cm/m   RA Area:     11.60 cm LA Vol (A2C):   40.0 ml 28.38 ml/m  RA Volume:   24.90 ml  17.67 ml/m LA Vol (A4C):   41.7 ml 29.59 ml/m LA Biplane Vol: 40.8 ml 28.95 ml/m  AORTIC VALVE LVOT Vmax:   105.00 cm/s LVOT Vmean:  67.700 cm/s LVOT VTI:    0.177 m  AORTA Ao Root diam: 2.80 cm Ao Asc diam:  3.70 cm MITRAL VALVE                TRICUSPID VALVE MV Area (PHT): 3.91 cm     TR Peak grad:   24.4 mmHg MV Decel Time: 194 msec     TR Vmax:        247.00 cm/s MV E velocity: 79.20 cm/s MV A velocity: 148.00 cm/s  SHUNTS MV E/A ratio:  0.54         Systemic VTI:  0.18 m                             Systemic Diam: 1.90 cm Eleonore Chiquito MD Electronically signed by Eleonore Chiquito MD Signature Date/Time: 09/13/2021/4:58:19 PM    Final      Subjective: No acute issues or events overnight   Discharge Exam: Vitals:  09/17/21 0848 09/17/21 1156  BP:  (!) 146/88  Pulse:  87  Resp:  18  Temp:  98.6 F (37 C)  SpO2: 99% 94%   Vitals:   09/17/21 0400 09/17/21 0800 09/17/21 0848 09/17/21 1156  BP: (!) 167/95 (!) 156/99  (!) 146/88  Pulse: 89 94  87  Resp: 16 17  18   Temp: 98.6 F (37 C) 98.7 F (37.1 C)  98.6 F (37 C)  TempSrc: Oral Oral  Oral  SpO2: 93% 99% 99% 94%  Weight:      Height:        General: Pt is alert, awake, not in acute distress Cardiovascular: RRR, S1/S2 +, no rubs, no gallops Respiratory: CTA bilaterally, no wheezing, no rhonchi Abdominal: Soft, NT, ND, bowel sounds + Extremities: no edema, no cyanosis    The results of significant diagnostics from this hospitalization (including imaging, microbiology, ancillary and laboratory) are listed below for reference.     Microbiology: Recent Results (from  the past 240 hour(s))  Resp Panel by RT-PCR (Flu A&B, Covid) Nasopharyngeal Swab     Status: None   Collection Time: 09/10/21  2:46 PM   Specimen: Nasopharyngeal Swab; Nasopharyngeal(NP) swabs in vial transport medium  Result Value Ref Range Status   SARS Coronavirus 2 by RT PCR NEGATIVE NEGATIVE Final    Comment: (NOTE) SARS-CoV-2 target nucleic acids are NOT DETECTED.  The SARS-CoV-2 RNA is generally detectable in upper respiratory specimens during the acute phase of infection. The lowest concentration of SARS-CoV-2 viral copies this assay can detect is 138 copies/mL. A negative result does not preclude SARS-Cov-2 infection and should not be used as the sole basis for treatment or other patient management decisions. A negative result may occur with  improper specimen collection/handling, submission of specimen other than nasopharyngeal swab, presence of viral mutation(s) within the areas targeted by this assay, and inadequate number of viral copies(<138 copies/mL). A negative result must be combined with clinical observations, patient history, and epidemiological information. The expected result is Negative.  Fact Sheet for Patients:  EntrepreneurPulse.com.au  Fact Sheet for Healthcare Providers:  IncredibleEmployment.be  This test is no t yet approved or cleared by the [place] FDA and  has been authorized for detection and/or diagnosis of SARS-CoV-2 by FDA under an Emergency Use Authorization (EUA). This EUA will remain  in effect (meaning this test can be used) for the duration of the COVID-19 declaration under Section 564(b)(1) of the Act, 21 U.S.C.section 360bbb-3(b)(1), unless the authorization is terminated  or revoked sooner.       Influenza A by PCR NEGATIVE NEGATIVE Final   Influenza B by PCR NEGATIVE NEGATIVE Final    Comment: (NOTE) The Xpert Xpress SARS-CoV-2/FLU/RSV plus assay is intended as an aid in the diagnosis of  influenza from Nasopharyngeal swab specimens and should not be used as a sole basis for treatment. Nasal washings and aspirates are unacceptable for Xpert Xpress SARS-CoV-2/FLU/RSV testing.  Fact Sheet for Patients: EntrepreneurPulse.com.au  Fact Sheet for Healthcare Providers: IncredibleEmployment.be  This test is not yet approved or cleared by the [place] FDA and has been authorized for detection and/or diagnosis of SARS-CoV-2 by FDA under an Emergency Use Authorization (EUA). This EUA will remain in effect (meaning this test can be used) for the duration of the COVID-19 declaration under Section 564(b)(1) of the Act, 21 U.S.C. section 360bbb-3(b)(1), unless the authorization is terminated or revoked.  Performed at [facility], [place] [address], [place], [place] [address]  MRSA Next Gen by PCR, Nasal     Status: None   Collection Time: 09/11/21  4:11 AM   Specimen: Nasal Mucosa; Nasal Swab  Result Value Ref Range Status   MRSA by PCR Next Gen NOT DETECTED NOT DETECTED Final    Comment: (NOTE) The GeneXpert MRSA Assay (FDA approved for NASAL specimens only), is one component of a comprehensive MRSA colonization surveillance program. It is not intended to diagnose MRSA infection nor to guide or monitor treatment for MRSA infections. Test performance is not FDA approved in patients less than 46 years old. Performed at [facility], [place] [address], [place], [place] [address]      Labs: BNP (last 3 results) Recent Labs    09/10/21 1446  BNP 9,678.9*   Basic Metabolic Panel: Recent Labs  Lab 09/10/21 2325 09/11/21 0651 09/11/21 0914 09/12/21 0052 09/13/21 0046 09/14/21 0034 09/15/21 0355 09/16/21 0026  NA  --    < >  --  129* 130* 132* 138 137  K 2.1*   < >  --  3.9 4.0 4.8 4.7 5.0  CL  --    < >  --  96* 97* 98 103 101  CO2  --    < >  --  25 25 24 29 29   GLUCOSE  --    < >  --  118* 148* 121* 117* 106*   BUN  --    < >  --  49* 54* 54* 49* 49*  CREATININE  --    < >  --  3.02* 2.93* 2.57* 2.41* 2.66*  CALCIUM  --    < >  --  7.7* 7.8* 8.2* 8.6* 8.6*  MG 2.7*  --   --   --   --   --   --   --   PHOS  --   --  2.8  --   --   --   --   --    < > = values in this interval not displayed.   Liver Function Tests: Recent Labs  Lab 09/10/21 1446 09/15/21 0355  AST 47* 21  ALT 32 19  ALKPHOS 111 76  BILITOT 1.2 0.2*  PROT 6.8 5.4*  ALBUMIN 3.1* 2.2*   No results for input(s): LIPASE, AMYLASE in the last 168 hours. No results for input(s): AMMONIA in the last 168 hours. CBC: Recent Labs  Lab 09/10/21 1446 09/10/21 1535 09/12/21 0052 09/13/21 0046 09/14/21 0034 09/15/21 0355 09/15/21 0603 09/15/21 2003 09/16/21 0026  WBC 11.1*   < > 7.5 8.8 9.8 10.7*  --   --  7.8  NEUTROABS 7.6  --   --   --   --   --   --   --   --   HGB 9.5*   < > 7.2* 7.2* 8.3* 6.9* 6.8* 8.7* 8.9*  HCT 29.3*   < > 21.3* 21.7* 25.2* 20.9* 20.4* 26.0* 26.5*  MCV 84.7   < > 83.9 84.8 85.7 84.6  --   --  84.4  PLT 662*   < > 470* 455* 495* 476*  --   --  474*   < > = values in this interval not displayed.   Cardiac Enzymes: No results for input(s): CKTOTAL, CKMB, CKMBINDEX, TROPONINI in the last 168 hours. BNP: Invalid input(s): POCBNP CBG: No results for input(s): GLUCAP in the last 168 hours. D-Dimer No results for input(s): DDIMER in the last 72 hours. Hgb A1c  No results for input(s): HGBA1C in the last 72 hours. Lipid Profile No results for input(s): CHOL, HDL, LDLCALC, TRIG, CHOLHDL, LDLDIRECT in the last 72 hours. Thyroid function studies No results for input(s): TSH, T4TOTAL, T3FREE, THYROIDAB in the last 72 hours.  Invalid input(s): FREET3 Anemia work up No results for input(s): VITAMINB12, FOLATE, FERRITIN, TIBC, IRON, RETICCTPCT in the last 72 hours. Urinalysis    Component Value Date/Time   COLORURINE STRAW (A) 07/18/2021 1218   APPEARANCEUR CLEAR 07/18/2021 1218   LABSPEC 1.008  07/18/2021 1218   PHURINE 8.0 07/18/2021 [place] 07/18/2021 [place] 07/18/2021 [place] 07/18/2021 [place] 07/18/2021 1218   PROTEINUR 100 (A) 07/18/2021 1218   UROBILINOGEN 0.2 08/24/2015 1737   NITRITE NEGATIVE 07/18/2021 1218   LEUKOCYTESUR NEGATIVE 07/18/2021 1218   Sepsis Labs Invalid input(s): PROCALCITONIN,  WBC,  LACTICIDVEN Microbiology Recent Results (from the past 240 hour(s))  Resp Panel by RT-PCR (Flu A&B, Covid) Nasopharyngeal Swab     Status: None   Collection Time: 09/10/21  2:46 PM   Specimen: Nasopharyngeal Swab; Nasopharyngeal(NP) swabs in vial transport medium  Result Value Ref Range Status   SARS Coronavirus 2 by RT PCR NEGATIVE NEGATIVE Final    Comment: (NOTE) SARS-CoV-2 target nucleic acids are NOT DETECTED.  The SARS-CoV-2 RNA is generally detectable in upper respiratory specimens during the acute phase of infection. The lowest concentration of SARS-CoV-2 viral copies this assay can detect is 138 copies/mL. A negative result does not preclude SARS-Cov-2 infection and should not be used as the sole basis for treatment or other patient management decisions. A negative result may occur with  improper specimen collection/handling, submission of specimen other than nasopharyngeal swab, presence of viral mutation(s) within the areas targeted by this assay, and inadequate number of viral copies(<138 copies/mL). A negative result must be combined with clinical observations, patient history, and epidemiological information. The expected result is Negative.  Fact Sheet for Patients:  EntrepreneurPulse.com.au  Fact Sheet for Healthcare Providers:  IncredibleEmployment.be  This test is no t yet approved or cleared by the [place] FDA and  has been authorized for detection and/or diagnosis of SARS-CoV-2 by FDA under an Emergency Use Authorization (EUA).  This EUA will remain  in effect (meaning this test can be used) for the duration of the COVID-19 declaration under Section 564(b)(1) of the Act, 21 U.S.C.section 360bbb-3(b)(1), unless the authorization is terminated  or revoked sooner.       Influenza A by PCR NEGATIVE NEGATIVE Final   Influenza B by PCR NEGATIVE NEGATIVE Final    Comment: (NOTE) The Xpert Xpress SARS-CoV-2/FLU/RSV plus assay is intended as an aid in the diagnosis of influenza from Nasopharyngeal swab specimens and should not be used as a sole basis for treatment. Nasal washings and aspirates are unacceptable for Xpert Xpress SARS-CoV-2/FLU/RSV testing.  Fact Sheet for Patients: EntrepreneurPulse.com.au  Fact Sheet for Healthcare Providers: IncredibleEmployment.be  This test is not yet approved or cleared by the [place] FDA and has been authorized for detection and/or diagnosis of SARS-CoV-2 by FDA under an Emergency Use Authorization (EUA). This EUA will remain in effect (meaning this test can be used) for the duration of the COVID-19 declaration under Section 564(b)(1) of the Act, 21 U.S.C. section 360bbb-3(b)(1), unless the authorization is terminated or revoked.  Performed at [facility], [place] [address] [place], [place] [address]   MRSA Next Gen by PCR, Nasal  Status: None   Collection Time: 09/11/21  4:11 AM   Specimen: Nasal Mucosa; Nasal Swab  Result Value Ref Range Status   MRSA by PCR Next Gen NOT DETECTED NOT DETECTED Final    Comment: (NOTE) The GeneXpert MRSA Assay (FDA approved for NASAL specimens only), is one component of a comprehensive MRSA colonization surveillance program. It is not intended to diagnose MRSA infection nor to guide or monitor treatment for MRSA infections. Test performance is not FDA approved in patients less than 61 years old. Performed at [facility], [place] [address], [place], [place] [address]      Time  coordinating discharge: Over 30 minutes  SIGNED:   Little Ishikawa, DO [facility] 09/17/2021, 12:01 PM Pager   If 7PM-7AM, please contact night-coverage [email or web address]

## 2021-09-17 NOTE — TOC Transition Note (Signed)
Transition of Care [facility]) - CM/SW Discharge Note   Patient Details  Name: Alyssa [name] MRN: [id number] Date of Birth: [birth year]  [facility] CM/SW Contact:  Zenon Mayo, RN Phone Number: 09/17/2021, 2:06 PM   Clinical Narrative:    Notified Angie with [facility] that patient is for dc today, [name] her son states someone is on the way to transport her home today.     Final next level of care: [place] Barriers to Discharge: No Barriers Identified   Patient Goals and CMS Choice Patient states their goals for this hospitalization and ongoing recovery are:: return home with [facility] CMS Medicare.gov Compare Post Acute Care list provided to:: Patient Choice offered to / list presented to : Patient  Discharge Placement                       Discharge Plan and Services In-house Referral: Clinical Social Work Discharge Planning Services: CM Consult Post Acute Care Choice: Home Health            DME Agency: NA       HH Arranged: RN, PT, OT, Nurse's Aide, Social Work [employer] Agency: [facility] Date [place]: 09/16/21 Time [place]: [place] Representative spoke with at [place]: [place] ([place]) Interventions     Readmission Risk Interventions Readmission Risk Prevention Plan 07/10/2020 12/26/2018  Transportation Screening Complete Complete  Medication Review Press photographer) Complete Complete  PCP or Specialist appointment within 3-5 days of discharge - Not Complete  PCP/Specialist Appt Not Complete comments - pt for snf. Snf MD to see as needed.  [place] or Home Care Consult Complete Not Complete  HRI or Home Care Consult Pt Refusal Comments - NA  SW Recovery Care/Counseling Consult Complete Not Complete  SW Consult Not Complete Comments - NA  Palliative Care Screening Not Applicable Complete  Skilled Nursing Facility Not Applicable Complete  Some recent data might be  hidden

## 2021-09-18 ENCOUNTER — Other Ambulatory Visit: Payer: Self-pay

## 2021-09-18 DIAGNOSIS — I724 Aneurysm of artery of lower extremity: Secondary | ICD-10-CM

## 2021-09-26 ENCOUNTER — Inpatient Hospital Stay (HOSPITAL_COMMUNITY)
Admission: EM | Admit: 2021-09-26 | Discharge: 2021-10-09 | DRG: 641 | Disposition: A | Discharge status: Home Health | Payer: Medicare HMO | Attending: Family Medicine | Admitting: Family Medicine

## 2021-09-26 ENCOUNTER — Emergency Department (HOSPITAL_COMMUNITY): Payer: Medicare HMO

## 2021-09-26 DIAGNOSIS — I358 Other nonrheumatic aortic valve disorders: Secondary | ICD-10-CM | POA: Diagnosis present

## 2021-09-26 DIAGNOSIS — I724 Aneurysm of artery of lower extremity: Secondary | ICD-10-CM | POA: Diagnosis present

## 2021-09-26 DIAGNOSIS — Z515 Encounter for palliative care: Secondary | ICD-10-CM

## 2021-09-26 DIAGNOSIS — Z7982 Long term (current) use of aspirin: Secondary | ICD-10-CM

## 2021-09-26 DIAGNOSIS — N2581 Secondary hyperparathyroidism of renal origin: Secondary | ICD-10-CM | POA: Diagnosis present

## 2021-09-26 DIAGNOSIS — I739 Peripheral vascular disease, unspecified: Secondary | ICD-10-CM | POA: Diagnosis present

## 2021-09-26 DIAGNOSIS — E875 Hyperkalemia: Principal | ICD-10-CM | POA: Diagnosis present

## 2021-09-26 DIAGNOSIS — J449 Chronic obstructive pulmonary disease, unspecified: Secondary | ICD-10-CM | POA: Diagnosis present

## 2021-09-26 DIAGNOSIS — F0390 Unspecified dementia without behavioral disturbance: Secondary | ICD-10-CM | POA: Diagnosis present

## 2021-09-26 DIAGNOSIS — R52 Pain, unspecified: Secondary | ICD-10-CM | POA: Diagnosis not present

## 2021-09-26 DIAGNOSIS — G8929 Other chronic pain: Secondary | ICD-10-CM | POA: Diagnosis present

## 2021-09-26 DIAGNOSIS — D631 Anemia in chronic kidney disease: Secondary | ICD-10-CM | POA: Diagnosis present

## 2021-09-26 DIAGNOSIS — N179 Acute kidney failure, unspecified: Secondary | ICD-10-CM | POA: Diagnosis present

## 2021-09-26 DIAGNOSIS — R627 Adult failure to thrive: Secondary | ICD-10-CM | POA: Diagnosis present

## 2021-09-26 DIAGNOSIS — Z809 Family history of malignant neoplasm, unspecified: Secondary | ICD-10-CM

## 2021-09-26 DIAGNOSIS — K5900 Constipation, unspecified: Secondary | ICD-10-CM | POA: Diagnosis present

## 2021-09-26 DIAGNOSIS — N189 Chronic kidney disease, unspecified: Secondary | ICD-10-CM | POA: Diagnosis present

## 2021-09-26 DIAGNOSIS — Z96641 Presence of right artificial hip joint: Secondary | ICD-10-CM | POA: Diagnosis present

## 2021-09-26 DIAGNOSIS — R001 Bradycardia, unspecified: Secondary | ICD-10-CM | POA: Diagnosis present

## 2021-09-26 DIAGNOSIS — B0229 Other postherpetic nervous system involvement: Secondary | ICD-10-CM | POA: Diagnosis present

## 2021-09-26 DIAGNOSIS — I351 Nonrheumatic aortic (valve) insufficiency: Secondary | ICD-10-CM | POA: Diagnosis present

## 2021-09-26 DIAGNOSIS — E785 Hyperlipidemia, unspecified: Secondary | ICD-10-CM | POA: Diagnosis present

## 2021-09-26 DIAGNOSIS — Z8249 Family history of ischemic heart disease and other diseases of the circulatory system: Secondary | ICD-10-CM

## 2021-09-26 DIAGNOSIS — T829XXA Unspecified complication of cardiac and vascular prosthetic device, implant and graft, initial encounter: Secondary | ICD-10-CM | POA: Diagnosis present

## 2021-09-26 DIAGNOSIS — N170 Acute kidney failure with tubular necrosis: Secondary | ICD-10-CM | POA: Diagnosis not present

## 2021-09-26 DIAGNOSIS — R42 Dizziness and giddiness: Secondary | ICD-10-CM | POA: Diagnosis not present

## 2021-09-26 DIAGNOSIS — Z20822 Contact with and (suspected) exposure to covid-19: Secondary | ICD-10-CM | POA: Diagnosis present

## 2021-09-26 DIAGNOSIS — I959 Hypotension, unspecified: Secondary | ICD-10-CM | POA: Diagnosis not present

## 2021-09-26 DIAGNOSIS — I10 Essential (primary) hypertension: Secondary | ICD-10-CM | POA: Diagnosis not present

## 2021-09-26 DIAGNOSIS — I371 Nonrheumatic pulmonary valve insufficiency: Secondary | ICD-10-CM | POA: Diagnosis present

## 2021-09-26 DIAGNOSIS — E440 Moderate protein-calorie malnutrition: Secondary | ICD-10-CM | POA: Diagnosis present

## 2021-09-26 DIAGNOSIS — N185 Chronic kidney disease, stage 5: Secondary | ICD-10-CM | POA: Diagnosis not present

## 2021-09-26 DIAGNOSIS — I2510 Atherosclerotic heart disease of native coronary artery without angina pectoris: Secondary | ICD-10-CM | POA: Diagnosis present

## 2021-09-26 DIAGNOSIS — Z79899 Other long term (current) drug therapy: Secondary | ICD-10-CM

## 2021-09-26 DIAGNOSIS — R531 Weakness: Secondary | ICD-10-CM | POA: Diagnosis not present

## 2021-09-26 DIAGNOSIS — Z66 Do not resuscitate: Secondary | ICD-10-CM | POA: Diagnosis present

## 2021-09-26 DIAGNOSIS — T82898A Other specified complication of vascular prosthetic devices, implants and grafts, initial encounter: Secondary | ICD-10-CM | POA: Diagnosis not present

## 2021-09-26 DIAGNOSIS — Z720 Tobacco use: Secondary | ICD-10-CM

## 2021-09-26 DIAGNOSIS — I700 Atherosclerosis of aorta: Secondary | ICD-10-CM | POA: Diagnosis present

## 2021-09-26 DIAGNOSIS — Z7902 Long term (current) use of antithrombotics/antiplatelets: Secondary | ICD-10-CM

## 2021-09-26 DIAGNOSIS — Z83438 Family history of other disorder of lipoprotein metabolism and other lipidemia: Secondary | ICD-10-CM

## 2021-09-26 DIAGNOSIS — F419 Anxiety disorder, unspecified: Secondary | ICD-10-CM | POA: Diagnosis present

## 2021-09-26 DIAGNOSIS — N184 Chronic kidney disease, stage 4 (severe): Secondary | ICD-10-CM | POA: Diagnosis present

## 2021-09-26 DIAGNOSIS — I129 Hypertensive chronic kidney disease with stage 1 through stage 4 chronic kidney disease, or unspecified chronic kidney disease: Secondary | ICD-10-CM | POA: Diagnosis present

## 2021-09-26 DIAGNOSIS — Z539 Procedure and treatment not carried out, unspecified reason: Secondary | ICD-10-CM | POA: Diagnosis present

## 2021-09-26 LAB — BASIC METABOLIC PANEL
Anion gap: 12 (ref 5–15)
BUN: 44 mg/dL — ABNORMAL HIGH (ref 8–23)
CO2: 14 mmol/L — ABNORMAL LOW (ref 22–32)
Calcium: 9 mg/dL (ref 8.9–10.3)
Chloride: 108 mmol/L (ref 98–111)
Creatinine, Ser: 4.37 mg/dL — ABNORMAL HIGH (ref 0.44–1.00)
GFR, Estimated: 10 mL/min — ABNORMAL LOW (ref >60)
Glucose, Bld: 130 mg/dL — ABNORMAL HIGH (ref 70–99)
Potassium: 6.9 mmol/L (ref 3.5–5.1)
Sodium: 134 mmol/L — ABNORMAL LOW (ref 135–145)

## 2021-09-26 LAB — RESP PANEL BY RT-PCR (FLU A&B, COVID) ARPGX2
Influenza A by PCR: NEGATIVE
Influenza B by PCR: NEGATIVE
SARS Coronavirus 2 by RT PCR: NEGATIVE

## 2021-09-26 LAB — CBC WITH DIFFERENTIAL/PLATELET
Abs Immature Granulocytes: 0.04 10*3/uL (ref 0.00–0.07)
Basophils Absolute: 0.1 10*3/uL (ref 0.0–0.1)
Basophils Relative: 1 %
Eosinophils Absolute: 0.2 10*3/uL (ref 0.0–0.5)
Eosinophils Relative: 2 %
HCT: 36.5 % (ref 36.0–46.0)
Hemoglobin: 11.5 g/dL — ABNORMAL LOW (ref 12.0–15.0)
Immature Granulocytes: 0 %
Lymphocytes Relative: 28 %
Lymphs Abs: 2.6 10*3/uL (ref 0.7–4.0)
MCH: 28 pg (ref 26.0–34.0)
MCHC: 31.5 g/dL (ref 30.0–36.0)
MCV: 89 fL (ref 80.0–100.0)
Monocytes Absolute: 0.7 10*3/uL (ref 0.1–1.0)
Monocytes Relative: 8 %
Neutro Abs: 5.6 10*3/uL (ref 1.7–7.7)
Neutrophils Relative %: 61 %
Platelets: 500 10*3/uL — ABNORMAL HIGH (ref 150–400)
RBC: 4.1 MIL/uL (ref 3.87–5.11)
RDW: 18.1 % — ABNORMAL HIGH (ref 11.5–15.5)
WBC: 9.2 10*3/uL (ref 4.0–10.5)
nRBC: 0 % (ref 0.0–0.2)

## 2021-09-26 LAB — CBC
HCT: 28.9 % — ABNORMAL LOW (ref 36.0–46.0)
Hemoglobin: 9 g/dL — ABNORMAL LOW (ref 12.0–15.0)
MCH: 27.4 pg (ref 26.0–34.0)
MCHC: 31.1 g/dL (ref 30.0–36.0)
MCV: 88.1 fL (ref 80.0–100.0)
Platelets: 295 10*3/uL (ref 150–400)
RBC: 3.28 MIL/uL — ABNORMAL LOW (ref 3.87–5.11)
RDW: 18 % — ABNORMAL HIGH (ref 11.5–15.5)
WBC: 6.3 10*3/uL (ref 4.0–10.5)
nRBC: 0.3 % — ABNORMAL HIGH (ref 0.0–0.2)

## 2021-09-26 LAB — MAGNESIUM: Magnesium: 3.4 mg/dL — ABNORMAL HIGH (ref 1.7–2.4)

## 2021-09-26 LAB — TROPONIN I (HIGH SENSITIVITY): Troponin I (High Sensitivity): 26 ng/L — ABNORMAL HIGH (ref <18)

## 2021-09-26 MED ORDER — PANTOPRAZOLE SODIUM 40 MG IV SOLR
40.0000 mg | Freq: Every day | INTRAVENOUS | Status: DC
  Administered 2021-09-26 – 2021-09-27 (×2): 40 mg via INTRAVENOUS
  Filled 2021-09-26 (×2): qty 40

## 2021-09-26 MED ORDER — FUROSEMIDE 10 MG/ML IJ SOLN
40.0000 mg | Freq: Once | INTRAMUSCULAR | Status: AC
  Administered 2021-09-26: 40 mg via INTRAVENOUS
  Filled 2021-09-26: qty 4

## 2021-09-26 MED ORDER — CALCIUM GLUCONATE-NACL 1-0.675 GM/50ML-% IV SOLN
1.0000 g | Freq: Once | INTRAVENOUS | Status: AC
  Administered 2021-09-29: 1000 mg via INTRAVENOUS
  Filled 2021-09-26 (×2): qty 50

## 2021-09-26 MED ORDER — SODIUM CHLORIDE 0.9 % IV BOLUS
500.0000 mL | Freq: Once | INTRAVENOUS | Status: AC
  Administered 2021-09-26: 500 mL via INTRAVENOUS

## 2021-09-26 MED ORDER — DEXTROSE 50 % IV SOLN
25.0000 g | Freq: Once | INTRAVENOUS | Status: AC
  Administered 2021-09-26: 25 g via INTRAVENOUS
  Filled 2021-09-26: qty 50

## 2021-09-26 MED ORDER — ONDANSETRON HCL 4 MG/2ML IJ SOLN: 4.0000 mg | Freq: Four times a day (QID) | INTRAMUSCULAR | Status: DC | PRN

## 2021-09-26 MED ORDER — STERILE WATER FOR INJECTION IV SOLN
INTRAVENOUS | Status: DC
  Filled 2021-09-26: qty 1000
  Filled 2021-09-26: qty 150

## 2021-09-26 MED ORDER — POLYETHYLENE GLYCOL 3350 17 G PO PACK
17.0000 g | PACK | Freq: Every day | ORAL | Status: DC | PRN
  Filled 2021-09-26: qty 1

## 2021-09-26 MED ORDER — CALCIUM GLUCONATE-NACL 1-0.675 GM/50ML-% IV SOLN
1.0000 g | Freq: Once | INTRAVENOUS | Status: AC
  Administered 2021-09-26: 1000 mg via INTRAVENOUS
  Filled 2021-09-26: qty 50

## 2021-09-26 MED ORDER — SODIUM BICARBONATE 8.4 % IV SOLN
50.0000 meq | Freq: Once | INTRAVENOUS | Status: AC
  Administered 2021-09-26: 50 meq via INTRAVENOUS
  Filled 2021-09-26: qty 50

## 2021-09-26 MED ORDER — SODIUM ZIRCONIUM CYCLOSILICATE 10 G PO PACK
10.0000 g | PACK | Freq: Once | ORAL | Status: AC
  Administered 2021-09-26: 10 g via ORAL
  Filled 2021-09-26: qty 1

## 2021-09-26 MED ORDER — DOCUSATE SODIUM 100 MG PO CAPS
100.0000 mg | ORAL_CAPSULE | Freq: Two times a day (BID) | ORAL | Status: DC | PRN
  Administered 2021-09-28: 100 mg via ORAL
  Filled 2021-09-26: qty 1

## 2021-09-26 MED ORDER — CHLORHEXIDINE GLUCONATE CLOTH 2 % EX PADS
6.0000 | MEDICATED_PAD | Freq: Every day | CUTANEOUS | Status: DC
  Administered 2021-09-26 – 2021-10-04 (×7): 6 via TOPICAL

## 2021-09-26 MED ORDER — HEPARIN SODIUM (PORCINE) 5000 UNIT/ML IJ SOLN
5000.0000 [IU] | Freq: Three times a day (TID) | INTRAMUSCULAR | Status: DC
  Administered 2021-09-26 – 2021-10-09 (×38): 5000 [IU] via SUBCUTANEOUS
  Filled 2021-09-26 (×37): qty 1

## 2021-09-26 MED ORDER — SODIUM CHLORIDE 0.9 % IV BOLUS
1000.0000 mL | Freq: Once | INTRAVENOUS | Status: AC
  Administered 2021-09-26: 1000 mL via INTRAVENOUS

## 2021-09-26 MED ORDER — ACETAMINOPHEN 325 MG PO TABS
650.0000 mg | ORAL_TABLET | Freq: Four times a day (QID) | ORAL | Status: DC | PRN
  Administered 2021-09-26 – 2021-10-07 (×11): 650 mg via ORAL
  Filled 2021-09-26 (×12): qty 2

## 2021-09-26 MED ORDER — INSULIN ASPART 100 UNIT/ML IV SOLN
8.0000 [IU] | Freq: Once | INTRAVENOUS | Status: AC
  Administered 2021-09-26: 8 [IU] via INTRAVENOUS

## 2021-09-26 NOTE — Consult Note (Signed)
Cardiology Consultation:   Patient ID: Alyssa [name] MRN: [id number]; DOB: 1941-03-08  Admit date: 09/26/2021 Date of Consult: 09/26/2021  PCP:  System, Provider Not In   [facility] Providers Cardiologist:  Shelva Majestic, MD        Patient Profile:   Alyssa [name] is a 80 y.o. female with a hx of CKD4, HTN with recent admission for hypertensive emergency, dementia, COPD, PVD who is being seen 09/26/2021 for the evaluation of bradycardia at the request of Dr Thamas Jaegers, ED.  History of Present Illness:   Alyssa [name] was last discharged 09/17/2021. Presenting now from her nursing home with lightheadedness, dizziness, and bradycardia to the 30s. Notes her chest feels odd without true pain. Denies any fevers, chills, constitutional symptoms; dyspnea at baseline Workup here notable for Cr 4.4 from b/l ~2.7, BUN 44, bicarb 14 normal b/l, K 6.9 (has not been >5) Telemetry and Ekgs with sinus rhythm with bradycardia to the 30s, frequent sinus pauses up to 5 seconds, some junctional escape beats. One 6 beat run of nonsustained monomorphic VT. Narrow QRS complexes. PR interval is not prolonged. Baseline Ekg is NSR, no AV block and narrow QRS She has been given bicarb, insulin, calcium gluconate, Iv fluids with some mild improvement in HR although still in the high 30s On coreg 12.5 Bid for hypertension   Past Medical History:  Diagnosis Date   Acute respiratory failure with hypoxia ([place]) 11/01/2019   Anxiety    Arthritis    Chest pain    Chronic kidney disease    Dyspnea    Elevated troponin 12/13/2018   Headache(784.0)    Hyperlipidemia    Hypertension    Joint pain    Leg pain    Peripheral neuropathy    Peripheral vascular disease ([place])    Syncope 09/2020    Past Surgical History:  Procedure Laterality Date   ABDOMINAL ANGIOGRAM N/A 10/29/2011   Procedure: ABDOMINAL ANGIOGRAM;  Surgeon: Elam Dutch, MD;  Location: [facility] CATH LAB;  Service:  Cardiovascular;  Laterality: N/A;   BACK SURGERY     COLONOSCOPY     ESOPHAGOGASTRODUODENOSCOPY     FEMORAL-FEMORAL BYPASS GRAFT  08/31/2010   FLEXIBLE SIGMOIDOSCOPY N/A 06/12/2019   Procedure: FLEXIBLE SIGMOIDOSCOPY;  Surgeon: Ladene Artist, MD;  Location: [facility];  Service: Endoscopy;  Laterality: N/A;  Patient had little bit to eat this morning so this will be unsedated.   JOINT REPLACEMENT Right 10/18/2010   Hip   LOWER EXTREMITY ANGIOGRAPHY  06/14/2017   Procedure: Lower Extremity Angiography;  Surgeon: Adrian Prows, MD;  Location: [place] CV LAB;  Service: Cardiovascular;;  Bilateral limited angio performed   RENAL ANGIOGRAPHY N/A 06/14/2017   Procedure: Renal Angiography;  Surgeon: Adrian Prows, MD;  Location: [place] CV LAB;  Service: Cardiovascular;  Laterality: N/A;   TOTAL HIP ARTHROPLASTY     right       Inpatient Medications: Scheduled Meds:  furosemide  40 mg Intravenous Once   sodium zirconium cyclosilicate  10 g Oral Once   Continuous Infusions:  calcium gluconate      sodium bicarbonate (isotonic) infusion in sterile water     PRN Meds: acetaminophen  Allergies:   No Known Allergies  Social History:   Social History   Socioeconomic History   Marital status: Single    Spouse name: Not on file   Number of children: 2   Years of education: 11th   Highest education level: Not on file  Occupational History   Occupation: Retired  Tobacco Use   Smoking status: Former    Packs/day: 1.00    Years: 50.00    Pack years: 50.00    Types: Cigarettes    Quit date: 04/22/2021    Years since quitting: 0.4   Smokeless tobacco: Never   Tobacco comments:    Quit 3 months ago 07/23/21 [facility]  Vaping Use   Vaping Use: Never used  Substance and Sexual Activity   Alcohol use: No   Drug use: No   Sexual activity: Not Currently  Other Topics Concern   Not on file  Social History Narrative    Lives at home with her brother.   Right-handed.   Occasional  caffeine use.   Social Determinants of Health   Financial Resource Strain: Not on file  Food Insecurity: Not on file  Transportation Needs: Not on file  Physical Activity: Not on file  Stress: Not on file  Social Connections: Not on file  Intimate Partner Violence: Not on file    Family History:    Family History  Problem Relation Age of Onset   Heart attack Mother    Heart disease Mother    Hyperlipidemia Mother    Hypertension Mother    Heart attack Father    Heart disease Father        Before age 27   Hyperlipidemia Father    Hypertension Father    Cancer Sister        brain tumor   Aneurysm Brother        brain   Colon cancer Neg Hx    Liver cancer Neg Hx      ROS:  Please see the history of present illness.   All other ROS reviewed and negative.     Physical Exam/Data:   Vitals:   09/26/21 1805 09/26/21 1900 09/26/21 1930 09/26/21 2000  BP: 119/62 (!) 124/54 102/74 119/69  Pulse: (!) 25 (!) 25 (!) 29 (!) 25  Resp: 15 15 17 18   SpO2: 100% 100% 100% 100%    Intake/Output Summary (Last 24 hours) at 09/26/2021 2121 Last data filed at 09/26/2021 2049 Gross per 24 hour  Intake 550 ml  Output --  Net 550 ml   Last 3 Weights 09/10/2021 08/07/2021 07/23/2021  Weight (lbs) 95 lb 0.3 oz 95 lb 97 lb  Weight (kg) 43.1 kg 43.092 kg 43.999 kg     There is no height or weight on file to calculate BMI.  General:  Well nourished, well developed, in no acute distress HEENT: normal Neck: Prominent EJ Vascular: No carotid bruits; Distal pulses 2+ bilaterally Cardiac:  bradycardic to 30s, regularly irregular, normal S1, S2;  no murmur  Lungs:  clear to auscultation bilaterally, no wheezing, rhonchi or rales  Abd: soft, nontender, no hepatomegaly  Ext: no edema Musculoskeletal:  No deformities, BUE and BLE strength normal and equal Skin: warm and dry  Neuro:  CNs 2-12 intact, no focal abnormalities noted Psych:  Normal affect   EKG:  The EKG was personally  reviewed and demonstrates:  Marked sinus bradycardia to the 30s; frequent pauses, some with junctional escapes. P waves challenging to discern on EKGs, better seen on telemetry. No AV block or BBB Telemetry:  Telemetry was personally reviewed and demonstrates: sinus rhythm with bradycardia to the 30s, frequent sinus pauses up to 5 seconds, some junctional escape beats. One 6 beat run of nonsustained monomorphic VT. Narrow QRS complexes. PR interval is not prolonged.  Relevant CV Studies:   Laboratory Data:  High Sensitivity Troponin:   Recent Labs  Lab 09/10/21 1446 09/10/21 1635 09/26/21 1721  TROPONINIHS 188* 169* 26*     Chemistry Recent Labs  Lab 09/26/21 1721  NA 134*  K 6.9*  CL 108  CO2 14*  GLUCOSE 130*  BUN 44*  CREATININE 4.37*  CALCIUM 9.0  MG 3.4*  GFRNONAA 10*  ANIONGAP 12    No results for input(s): PROT, ALBUMIN, AST, ALT, ALKPHOS, BILITOT in the last 168 hours. Lipids No results for input(s): CHOL, TRIG, HDL, LABVLDL, LDLCALC, CHOLHDL in the last 168 hours.  Hematology Recent Labs  Lab 09/26/21 1721  WBC 9.2  RBC 4.10  HGB 11.5*  HCT 36.5  MCV 89.0  MCH 28.0  MCHC 31.5  RDW 18.1*  PLT 500*   Thyroid No results for input(s): TSH, FREET4 in the last 168 hours.  BNPNo results for input(s): BNP, PROBNP in the last 168 hours.  DDimer No results for input(s): DDIMER in the last 168 hours.   Radiology/Studies:  DG Chest Port 1 View  Result Date: 09/26/2021 CLINICAL DATA:  Weakness EXAM: PORTABLE CHEST 1 VIEW COMPARISON:  09/10/2021 FINDINGS: Stable cardiomediastinal contours. Probable mild atelectasis at the right lung base. No pleural effusion. IMPRESSION: Mild atelectasis right lung base. Electronically Signed   By: Macy Mis M.D.   On: 09/26/2021 17:50     Assessment and Plan:  Alyssa [name] is a 20 YOF hx CKD4, HTN, PVD, COPd, dementia who felt lightheaded and was found to have marked sinus bradycardia with sinus pauses of up to 5  seconds with junctional escape complexes. No AV block. These abnormalities are due to severe hyperkalemia due to an AKI  Bradycardia: Agree with aggressive medical treatment of hyperkalemia (insulin, calcium, albuterol, bicarb, lokelma). Would add high dose IV lasix to help remove K from the body (can give with fluids if concerns for AKI due to volume overload). Since the patient has stable Bps and has no signs of AV block there is no role for transcutaneous pacing at this time. If the patient's bradyarrhythmia was to worsen with hemodynamically significant AV block would be okay to transcutaneously pace or give dopamine but would not recommend transvenous pacing given high risks of tachyarrhythmias in the setting of hyperkalemia. Instead the patient would need dialysis to reverse the cause of the bradycardia. Carefully trend potassium and bicarb. Needs washout of home coreg Hypertension: hold home coreg. Remaining meds okay to restart when okay from a BP standpoint, defer to primary team   Risk Assessment/Risk Scores:                For questions or updates, please contact [place] Please consult [email or web address] for contact info under    Signed, [place] Shalaine [place], MD  09/26/2021 9:21 PM

## 2021-09-26 NOTE — Progress Notes (Signed)
eLink Physician-Brief Progress Note [name] DOB: 1940-10-24 MRN: [id number]   Date of Service  09/26/2021  HPI/Events of Note  75F with dementia, HTN, CKD IV, PVD and COPD admitted for bradycardia in setting of hyperkalemia. Given Ca, bicarb, lasix, lokelma.  Of note recent admission for hypertensive emergency  Camera check: Patient AAOx3. HR 70s. Normotensive  eICU Interventions  Trend K Telemetry     Intervention Category Evaluation Type: New Patient Evaluation  Reylene Stauder Rodman Pickle 09/26/2021, 11:45 PM

## 2021-09-26 NOTE — ED Triage Notes (Addendum)
PT BIB [facility] for failure to thrive with bradycardia in the 30's. PT recently hospitalized, diagnosed with kidney failure. No plans for dialysis at this time. EMS states that she has barely been out of bed except to go to bathroom and sometimes eat.  Rehab recommended after last hospitalization but not initiated d/t prior bad experience with another rehab facility. PT A&O x4 per EMS.   EMS VS 108/64 96% RA, HR 36, RR18

## 2021-09-26 NOTE — ED Provider Notes (Addendum)
[facility] EMERGENCY DEPARTMENT Provider Note   CSN: [id number] Arrival date & time: 09/26/21  1649     History No chief complaint on file.   Alyssa [name] is a 80 y.o. female.  Patient with history of dementia, AKI, hypertension presents from nursing home with recent admission to the hospital last week found to have a complaint of lightheadedness dizziness, heart rate found to be in the 30s.  No reports of headache or chest pain or abdominal pain.  No reports of fevers or cough or vomiting or diarrhea.      Past Medical History:  Diagnosis Date   Acute respiratory failure with hypoxia ([place]) 11/01/2019   Anxiety    Arthritis    Chest pain    Chronic kidney disease    Dyspnea    Elevated troponin 12/13/2018   Headache(784.0)    Hyperlipidemia    Hypertension    Joint pain    Leg pain    Peripheral neuropathy    Peripheral vascular disease ([place])    Syncope 09/2020    Patient Active Problem List   Diagnosis Date Noted   Hypertensive crisis 09/10/2021   Rectal bleeding    Rectal pain    Malnutrition of moderate degree 06/17/2021   Weight loss 06/16/2021   Zoster 06/16/2021   Collapsed lung    COPD  GOLD 0/ still smoking  09/26/2020   Cigarette smoker 09/26/2020   Shortness of breath 07/06/2020   Essential hypertension 07/06/2020   Abdominal pain 07/06/2020   Elevated INR 07/06/2020   Acute respiratory failure due to COVID-19 ([place]) 06/02/2020   Acute renal failure superimposed on chronic kidney disease ([place]) 02/06/2020   Leukocytosis 02/06/2020   Thrombocytosis 02/06/2020   Palliative care encounter    Constipation 01/05/2020   Protein-calorie malnutrition, severe ([place]) 01/04/2020   Acute on chronic renal failure ([place]) 01/04/2020   Hyperkalemia 01/03/2020   AKI (acute kidney injury) ([place]) 01/03/2020   Acute respiratory failure with hypoxia ([place]) 11/01/2019   UTI (urinary tract infection) 11/01/2019   CAP (community acquired  pneumonia) 11/01/2019   Acute cystitis without hematuria    Encephalopathy 09/18/2019   SAH (subarachnoid hemorrhage) ([place]) 09/18/2019   Dementia ([place]) 60/63/0160   Metabolic acidosis 10/93/2355   ARF (acute renal failure) ([place]) 09/17/2019   Acute encephalopathy 09/17/2019   Abnormal CT scan, colon    Diarrhea    Diverticulitis 06/07/2019   Depression with anxiety 06/07/2019   DNR (do not resuscitate) 03/23/2019   Volume overload 03/23/2019   Edema 03/23/2019   Acute renal failure (ARF) ([place]) 03/08/2019   Hypokalemia 03/08/2019   Palliative care by specialist    Goals of care, counseling/discussion    Altered mental status    Encephalopathy, toxic 12/24/2018   Aspiration pneumonia of both lower lobes due to gastric secretions ([place]) 12/24/2018   Non-ST elevation (NSTEMI) myocardial infarction [facility])    Chest pain    Dyspnea on exertion    Elevated troponin level 12/09/2018   Chronic migraine 03/27/2018   Lumbar radiculopathy 03/27/2018   Gait abnormality 03/27/2018   Hypertensive urgency 12/28/2016   CKD (chronic kidney disease) stage 4, GFR 15-29 ml/min (HCC) 12/28/2016   Anemia 12/28/2016   Chronic pain syndrome 12/28/2016   Benzodiazepine withdrawal without complication ([place]) 73/22/0254   Chronic right shoulder pain 12/06/2016   Fall 09/17/2016   Fracture of humeral head, closed, right, initial encounter 09/17/2016   Rib fractures 09/17/2016   Multiple falls 09/17/2016   Severe  muscle deconditioning 09/17/2016   Failure to thrive in adult 09/17/2016   Melena 04/25/2016   Transient hypotension 04/25/2016   Anxiety state 04/25/2016   Tobacco abuse 04/25/2016   Hyperlipidemia 04/25/2016   Syncope 04/24/2016   Near syncope 04/24/2016   Gait difficulty 01/12/2016   Foot drop, left 01/12/2016   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   Spondylolisthesis at L4-L5 level 07/30/2015   PVD (peripheral vascular disease) ([place]) 07/29/2014    Weakness-Bilateral arm/leg 07/29/2014   Numbness-left leg 07/29/2014   Swelling of limb-Legs 07/29/2014   Atherosclerosis of native arteries of the extremities with intermittent claudication 09/30/2011    Past Surgical History:  Procedure Laterality Date   ABDOMINAL ANGIOGRAM N/A 10/29/2011   Procedure: ABDOMINAL ANGIOGRAM;  Surgeon: Elam Dutch, MD;  Location: [facility] LAB;  Service: Cardiovascular;  Laterality: N/A;   BACK SURGERY     COLONOSCOPY     ESOPHAGOGASTRODUODENOSCOPY     FEMORAL-FEMORAL BYPASS GRAFT  08/31/2010   FLEXIBLE SIGMOIDOSCOPY N/A 06/12/2019   Procedure: FLEXIBLE SIGMOIDOSCOPY;  Surgeon: Ladene Artist, MD;  Location: [facility];  Service: Endoscopy;  Laterality: N/A;  Patient had little bit to eat this morning so this will be unsedated.   JOINT REPLACEMENT Right 10/18/2010   Hip   LOWER EXTREMITY ANGIOGRAPHY  06/14/2017   Procedure: Lower Extremity Angiography;  Surgeon: Adrian Prows, MD;  Location: [facility] CV LAB;  Service: Cardiovascular;;  Bilateral limited angio performed   RENAL ANGIOGRAPHY N/A 06/14/2017   Procedure: Renal Angiography;  Surgeon: Adrian Prows, MD;  Location: [facility] CV LAB;  Service: Cardiovascular;  Laterality: N/A;   TOTAL HIP ARTHROPLASTY     right     OB History   No obstetric history on file.     Family History  Problem Relation Age of Onset   Heart attack Mother    Heart disease Mother    Hyperlipidemia Mother    Hypertension Mother    Heart attack Father    Heart disease Father        Before age 59   Hyperlipidemia Father    Hypertension Father    Cancer Sister        brain tumor   Aneurysm Brother        brain   Colon cancer Neg Hx    Liver cancer Neg Hx     Social History   Tobacco Use   Smoking status: Former    Packs/day: 1.00    Years: 50.00    Pack years: 50.00    Types: Cigarettes    Quit date: 04/22/2021    Years since quitting: 0.4   Smokeless tobacco: Never   Tobacco comments:     Quit 3 months ago 07/23/21 [facility]  Vaping Use   Vaping Use: Never used  Substance Use Topics   Alcohol use: No   Drug use: No    Home Medications Prior to Admission medications   Medication Sig Start Date End Date Taking? Authorizing Provider  acetaminophen (TYLENOL) 650 MG CR tablet Take 650 mg by mouth daily.   Yes [provider]  albuterol (VENTOLIN HFA) 108 (90 Base) MCG/ACT inhaler Inhale 2 puffs into the lungs every 4 (four) hours as needed for wheezing or shortness of breath.   Yes [provider]  amitriptyline (ELAVIL) 10 MG tablet Take 10 mg by mouth daily.   Yes [provider]  amLODipine (NORVASC) 10 MG tablet Take 1 tablet (10 mg total)  by mouth daily. 09/18/21  Yes Little Ishikawa, MD  aspirin 81 MG EC tablet Take 1 tablet (81 mg total) by mouth daily. 01/09/19  Yes Gherghe, Vella Redhead, MD  Budeson-Glycopyrrol-Formoterol (BREZTRI AEROSPHERE) 160-9-4.8 MCG/ACT AERO Inhale 2 puffs into the lungs 2 (two) times daily. 07/23/21  Yes Tanda Rockers, MD  carvedilol (COREG) 12.5 MG tablet Take 1 tablet (12.5 mg total) by mouth 2 (two) times daily. 09/17/21  Yes Little Ishikawa, MD  cloNIDine (CATAPRES) 0.1 MG tablet Take 1 tablet (0.1 mg total) by mouth 2 (two) times daily. 09/17/21 11/16/21 Yes Little Ishikawa, MD  clopidogrel (PLAVIX) 75 MG tablet Take 1 tablet (75 mg total) by mouth daily. 01/09/19  Yes Caren Griffins, MD  diclofenac Sodium (VOLTAREN) 1 % GEL Apply 2 g topically every 6 (six) hours as needed (knee pain). 01/15/20  Yes [provider]  dicyclomine (BENTYL) 20 MG tablet Take 20 mg by mouth 3 (three) times daily. 04/01/20  Yes [provider]  docusate sodium (COLACE) 100 MG capsule Take 1 capsule (100 mg total) by mouth daily. 07/11/21  Yes Kc, Maren Beach, MD  donepezil (ARICEPT) 5 MG tablet Take 1 tablet (5 mg total) by mouth daily. 11/20/20  Yes Cameron Sprang, MD  Ferrous Sulfate (IRON HIGH-POTENCY) 325 MG TABS Take 325  mg by mouth daily with breakfast. Patient taking differently: Take 325 mg by mouth daily. 06/30/19  Yes Hongalgi, Lenis Dickinson, MD  hydrOXYzine (ATARAX/VISTARIL) 25 MG tablet Take 25 mg by mouth at bedtime. 05/12/20  Yes [provider]  lidocaine (LIDODERM) 5 % Place 1 patch onto the skin daily. 08/31/21  Yes [provider]  magnesium oxide (MAG-OX) 400 MG tablet Take 400 mg by mouth 2 (two) times daily.   Yes [provider]  megestrol (MEGACE) 40 MG tablet Take 40 mg by mouth 2 (two) times daily. 05/12/20  Yes [provider]  methocarbamol (ROBAXIN) 500 MG tablet Take 500 mg by mouth 3 (three) times daily.   Yes [provider]  Multiple Vitamins-Minerals (HAIR/SKIN/NAILS) TABS Take 1 tablet by mouth daily.   Yes [provider]  nitroGLYCERIN (NITROSTAT) 0.4 MG SL tablet Place 1 tablet (0.4 mg total) under the tongue every 5 (five) minutes as needed for chest pain. 01/07/20  Yes Allie Bossier, MD  pantoprazole (PROTONIX) 40 MG tablet Take 1 tablet (40 mg total) by mouth daily. 06/16/19  Yes Hongalgi, Lenis Dickinson, MD  pregabalin (LYRICA) 75 MG capsule Take 1 capsule (75 mg total) by mouth daily. 07/11/21 11/17/21 Yes Antonieta Pert, MD  sodium bicarbonate 650 MG tablet Take 2 tablets (1,300 mg total) by mouth 2 (two) times daily. 01/07/20  Yes Allie Bossier, MD  valproic acid (DEPAKENE) 250 MG capsule Take 250 mg by mouth at bedtime.   Yes [provider]  Ensure Plus (ENSURE PLUS) LIQD Take 237 mLs by mouth daily.    [provider]    Allergies    Patient has no known allergies.  Review of Systems   Review of Systems  Unable to perform ROS: Dementia   Physical Exam Updated Vital Signs BP 119/69   Pulse (!) 25   Resp 18   SpO2 100%   Physical Exam Constitutional:      General: She is not in acute distress.    Appearance: Normal appearance.  HENT:     Head: Normocephalic.     Nose: Nose normal.  Eyes:  Extraocular  Movements: Extraocular movements intact.  Cardiovascular:     Rate and Rhythm: Bradycardia present.  Pulmonary:     Effort: Pulmonary effort is normal.  Musculoskeletal:        General: Normal range of motion.     Cervical back: Normal range of motion.  Neurological:     General: No focal deficit present.     Mental Status: She is alert. Mental status is at baseline.    ED Results / Procedures / Treatments   Labs (all labs ordered are listed, but only abnormal results are displayed) Labs Reviewed  CBC WITH DIFFERENTIAL/PLATELET - Abnormal; Notable for the following components:      Result Value   Hemoglobin 11.5 (*)    RDW 18.1 (*)    Platelets 500 (*)    All other components within normal limits  BASIC METABOLIC PANEL - Abnormal; Notable for the following components:   Sodium 134 (*)    Potassium 6.9 (*)    CO2 14 (*)    Glucose, Bld 130 (*)    BUN 44 (*)    Creatinine, Ser 4.37 (*)    GFR, Estimated 10 (*)    All other components within normal limits  MAGNESIUM - Abnormal; Notable for the following components:   Magnesium 3.4 (*)    All other components within normal limits  TROPONIN I (HIGH SENSITIVITY) - Abnormal; Notable for the following components:   Troponin I (High Sensitivity) 26 (*)    All other components within normal limits  RESP PANEL BY RT-PCR (FLU A&B, COVID) ARPGX2  TROPONIN I (HIGH SENSITIVITY)    EKG None  Radiology DG Chest Port 1 View  Result Date: 09/26/2021 CLINICAL DATA:  Weakness EXAM: PORTABLE CHEST 1 VIEW COMPARISON:  09/10/2021 FINDINGS: Stable cardiomediastinal contours. Probable mild atelectasis at the right lung base. No pleural effusion. IMPRESSION: Mild atelectasis right lung base. Electronically Signed   By: Macy Mis M.D.   On: 09/26/2021 17:50    Procedures .Critical Care Performed by: Luna Fuse, MD Authorized by: Luna Fuse, MD   Critical care provider statement:    Critical care time (minutes):  40    Critical care time was exclusive of:  Separately billable procedures and treating other patients and teaching time   Critical care was necessary to treat or prevent imminent or life-threatening deterioration of the following conditions:  Cardiac failure, metabolic crisis and renal failure   Medications Ordered in ED Medications  acetaminophen (TYLENOL) tablet 650 mg (650 mg Oral Given 09/26/21 1821)  sodium chloride 0.9 % bolus 1,000 mL (has no administration in time range)  dextrose 50 % solution 25 g (has no administration in time range)  insulin aspart (novoLOG) injection 8 Units (has no administration in time range)  calcium gluconate 1 g/ 50 mL sodium chloride IVPB (has no administration in time range)  sodium bicarbonate injection 50 mEq (has no administration in time range)  sodium zirconium cyclosilicate (LOKELMA) packet 10 g (has no administration in time range)  sodium bicarbonate 150 mEq in sterile water 1,150 mL infusion (has no administration in time range)  sodium chloride 0.9 % bolus 500 mL (0 mLs Intravenous Stopped 09/26/21 1900)  calcium gluconate 1 g/ 50 mL sodium chloride IVPB (1,000 mg Intravenous New Bag/Given 09/26/21 1854)    ED Course  I have reviewed the triage vital signs and the nursing notes.  Pertinent labs & imaging results that were available during my care of the patient were  reviewed by me and considered in my medical decision making (see chart for details).    MDM Rules/Calculators/A&P                           Vital signs show heart rate ranging from 29 to 50 bpm averaging about 40 bpm.  At times appears to be sinus with P waves and QRSs, at times appears to be dissociated.  Labs show worsening kidney injury creatinine of 4, potassium 6.9.  Patient given calcium gluconate, D50 and insulin.,  Bicarb and bicarb drip as well.  Discussed with cardiology, recommending addressing renal failure and hyperkalemia.  Nephrology consulted.  ICU  consulted.   Final Clinical Impression(s) / ED Diagnoses Final diagnoses:  Acute kidney injury ([place])  Hyperkalemia  Bradycardia    Rx / DC Orders ED Discharge Orders     None        Luna Fuse, MD 09/26/21 2026    Luna Fuse, MD 09/26/21 2048

## 2021-09-27 DIAGNOSIS — E875 Hyperkalemia: Secondary | ICD-10-CM | POA: Diagnosis not present

## 2021-09-27 DIAGNOSIS — N179 Acute kidney failure, unspecified: Secondary | ICD-10-CM | POA: Diagnosis not present

## 2021-09-27 DIAGNOSIS — R001 Bradycardia, unspecified: Secondary | ICD-10-CM | POA: Diagnosis not present

## 2021-09-27 LAB — BASIC METABOLIC PANEL
Anion gap: 11 (ref 5–15)
Anion gap: 12 (ref 5–15)
BUN: 36 mg/dL — ABNORMAL HIGH (ref 8–23)
BUN: 37 mg/dL — ABNORMAL HIGH (ref 8–23)
CO2: 21 mmol/L — ABNORMAL LOW (ref 22–32)
CO2: 24 mmol/L (ref 22–32)
Calcium: 8.1 mg/dL — ABNORMAL LOW (ref 8.9–10.3)
Calcium: 8.3 mg/dL — ABNORMAL LOW (ref 8.9–10.3)
Chloride: 102 mmol/L (ref 98–111)
Chloride: 103 mmol/L (ref 98–111)
Creatinine, Ser: 3.8 mg/dL — ABNORMAL HIGH (ref 0.44–1.00)
Creatinine, Ser: 3.83 mg/dL — ABNORMAL HIGH (ref 0.44–1.00)
GFR, Estimated: 12 mL/min — ABNORMAL LOW (ref >60)
GFR, Estimated: 12 mL/min — ABNORMAL LOW (ref >60)
Glucose, Bld: 88 mg/dL (ref 70–99)
Glucose, Bld: 93 mg/dL (ref 70–99)
Potassium: 4.9 mmol/L (ref 3.5–5.1)
Potassium: 4.9 mmol/L (ref 3.5–5.1)
Sodium: 136 mmol/L (ref 135–145)
Sodium: 137 mmol/L (ref 135–145)

## 2021-09-27 LAB — CBC
HCT: 31.4 % — ABNORMAL LOW (ref 36.0–46.0)
Hemoglobin: 10.5 g/dL — ABNORMAL LOW (ref 12.0–15.0)
MCH: 27.9 pg (ref 26.0–34.0)
MCHC: 33.4 g/dL (ref 30.0–36.0)
MCV: 83.3 fL (ref 80.0–100.0)
Platelets: 444 10*3/uL — ABNORMAL HIGH (ref 150–400)
RBC: 3.77 MIL/uL — ABNORMAL LOW (ref 3.87–5.11)
RDW: 17.2 % — ABNORMAL HIGH (ref 11.5–15.5)
WBC: 8.2 10*3/uL (ref 4.0–10.5)
nRBC: 0 % (ref 0.0–0.2)

## 2021-09-27 LAB — PHOSPHORUS: Phosphorus: 4.7 mg/dL — ABNORMAL HIGH (ref 2.5–4.6)

## 2021-09-27 LAB — MRSA NEXT GEN BY PCR, NASAL: MRSA by PCR Next Gen: NOT DETECTED

## 2021-09-27 LAB — CREATININE, SERUM
Creatinine, Ser: 2.56 mg/dL — ABNORMAL HIGH (ref 0.44–1.00)
GFR, Estimated: 19 mL/min — ABNORMAL LOW (ref >60)

## 2021-09-27 LAB — MAGNESIUM: Magnesium: 2.8 mg/dL — ABNORMAL HIGH (ref 1.7–2.4)

## 2021-09-27 LAB — TROPONIN I (HIGH SENSITIVITY): Troponin I (High Sensitivity): 17 ng/L

## 2021-09-27 MED ORDER — LIDOCAINE 5 % EX PTCH
1.0000 | MEDICATED_PATCH | CUTANEOUS | Status: DC
  Administered 2021-09-27 – 2021-10-09 (×13): 1 via TRANSDERMAL
  Filled 2021-09-27 (×13): qty 1

## 2021-09-27 MED ORDER — METHOCARBAMOL 500 MG PO TABS
500.0000 mg | ORAL_TABLET | Freq: Three times a day (TID) | ORAL | Status: DC | PRN
  Administered 2021-09-27: 500 mg via ORAL
  Filled 2021-09-27 (×2): qty 1

## 2021-09-27 MED ORDER — PREGABALIN 75 MG PO CAPS
75.0000 mg | ORAL_CAPSULE | Freq: Every day | ORAL | Status: DC
  Administered 2021-09-27 – 2021-10-09 (×13): 75 mg via ORAL
  Filled 2021-09-27 (×13): qty 1

## 2021-09-27 NOTE — H&P (Signed)
NAME:  Alyssa [name], MRN:  [id number], DOB:  05-22-1941, LOS: 1 ADMISSION DATE:  09/26/2021, CONSULTATION DATE:  09/26/2021 REFERRING MD: Emergency room, CHIEF COMPLAINT: Generalized fatigue  History of Present Illness:  This is a 80 year old black female who presented to the emergency room from home.  Patient states that over the past 1 week she has had increasing fatigue, mild dyspnea on exertion, lightheadedness.  She denies any chest pain/chest pressure/nausea or vomiting.  While in the emergency room she was found to have a heart rate of 30, creatinine of 4.4 and a potassium of 6.9.  She has a known history of chronic kidney disease and had been told in the past that they were considering dialysis. Heart rate remains in the mid 30s.  Blood pressures remained adequate.   Pertinent  Medical History  Chronic kidney disease  Significant Hospital Events: Including procedures, antibiotic start and stop dates in addition to other pertinent events      Objective   Blood pressure 131/77, pulse 64, temperature 97.6 F (36.4 C), temperature source Oral, resp. rate 12, SpO2 100 %.        Intake/Output Summary (Last 24 hours) at 09/27/2021 0120 Last data filed at 09/27/2021 0000 Gross per 24 hour  Intake 1779.72 ml  Output --  Net 1779.72 ml   There were no vitals filed for this visit.  Examination: General: No acute distress. HENT: Atraumatic/normocephalic Lungs: Clear to auscultation bilaterally no significant wheezing rales or rhonchi Cardiovascular: Bradycardia Abdomen: Soft, nontender, nondistended, no rebound/rigidity/guarding.  Positive bowel sounds Extremities: Distal pulse intact x4.  The right lower extremity is significantly shorter than the left but that is chronic secondary to hip surgeries.  No cyanosis or clubbing. Neuro: Patient is conscious alert and oriented x3.  Motor is generally weak but intact.  There is no focal deficits. Skin: No significant  rash.   Assessment & Plan:  Symptomatic bradycardia Hyperkalemia Acute on chronic kidney disease Hypertension  Plan: Patient will be admitted to the intensive care unit for further work-up. Hyperkalemia therapy was initiated with calcium, sodium bicarbonate, Lokelma and albuterol. Will continue to monitor BMP. Closely follow I's and O's. Nephrology consult pending. Patient currently has a heart rate of 38 but blood pressure is adequate.  Appreciate cardiology input.  Patient was previously DNR and will remain a DNR per her wishes.  Best Practice (right click and "Reselect all SmartList Selections" daily)   Diet/type: Regular consistency (see orders) DVT prophylaxis: prophylactic heparin  GI prophylaxis: PPI Lines: N/A Foley:  N/A Code Status:  DNR Last date of multidisciplinary goals of care discussion []   Labs   CBC: Recent Labs  Lab 09/26/21 1721 09/26/21 2255  WBC 9.2 6.3  NEUTROABS 5.6  --   HGB 11.5* 9.0*  HCT 36.5 28.9*  MCV 89.0 88.1  PLT 500* 329    Basic Metabolic Panel: Recent Labs  Lab 09/26/21 1721 09/26/21 2255 09/27/21 0000  NA 134*  --  137  K 6.9*  --  4.9  CL 108  --  102  CO2 14*  --  24  GLUCOSE 130*  --  88  BUN 44*  --  37*  CREATININE 4.37* 2.56* 3.80*  CALCIUM 9.0  --  8.1*  MG 3.4*  --   --    GFR: CrCl cannot be calculated (Unknown ideal weight.). Recent Labs  Lab 09/26/21 1721 09/26/21 2255  WBC 9.2 6.3    Liver Function Tests: No results for input(s): AST,  ALT, ALKPHOS, BILITOT, PROT, ALBUMIN in the last 168 hours. No results for input(s): LIPASE, AMYLASE in the last 168 hours. No results for input(s): AMMONIA in the last 168 hours.  ABG    Component Value Date/Time   PHART 7.456 (H) 09/10/2021 1535   PCO2ART 26.3 (L) 09/10/2021 1535   PO2ART 131 (H) 09/10/2021 1535   HCO3 18.7 (L) 09/10/2021 1535   TCO2 19 (L) 09/10/2021 1535   ACIDBASEDEF 5.0 (H) 09/10/2021 1535   O2SAT 99.0 09/10/2021 1535      Coagulation Profile: No results for input(s): INR, PROTIME in the last 168 hours.  Cardiac Enzymes: No results for input(s): CKTOTAL, CKMB, CKMBINDEX, TROPONINI in the last 168 hours.  HbA1C: Hgb A1c MFr Bld  Date/Time Value Ref Range Status  06/21/2021 04:16 PM 6.7 (H) 4.8 - 5.6 % Final    Comment:    REPEATED TO VERIFY (NOTE) Pre diabetes:          5.7%-6.4%  Diabetes:              >6.4%  Glycemic control for   <7.0% adults with diabetes   06/07/2020 06:51 AM 6.2 (H) 4.8 - 5.6 % Final    Comment:    (NOTE) Pre diabetes:          5.7%-6.4%  Diabetes:              >6.4%  Glycemic control for   <7.0% adults with diabetes     CBG: No results for input(s): GLUCAP in the last 168 hours.  Review of Systems:     Past Medical History:  She,  has a past medical history of Acute respiratory failure with hypoxia ([place]) (11/01/2019), Anxiety, Arthritis, Chest pain, Chronic kidney disease, Dyspnea, Elevated troponin (12/13/2018), Headache(784.0), Hyperlipidemia, Hypertension, Joint pain, Leg pain, Peripheral neuropathy, Peripheral vascular disease ([place]), and Syncope (09/2020).   Surgical History:   Past Surgical History:  Procedure Laterality Date   ABDOMINAL ANGIOGRAM N/A 10/29/2011   Procedure: ABDOMINAL ANGIOGRAM;  Surgeon: Elam Dutch, MD;  Location: [facility] CATH LAB;  Service: Cardiovascular;  Laterality: N/A;   BACK SURGERY     COLONOSCOPY     ESOPHAGOGASTRODUODENOSCOPY     FEMORAL-FEMORAL BYPASS GRAFT  08/31/2010   FLEXIBLE SIGMOIDOSCOPY N/A 06/12/2019   Procedure: FLEXIBLE SIGMOIDOSCOPY;  Surgeon: Ladene Artist, MD;  Location: [facility];  Service: Endoscopy;  Laterality: N/A;  Patient had little bit to eat this morning so this will be unsedated.   JOINT REPLACEMENT Right 10/18/2010   Hip   LOWER EXTREMITY ANGIOGRAPHY  06/14/2017   Procedure: Lower Extremity Angiography;  Surgeon: Adrian Prows, MD;  Location: [facility] CV LAB;  Service: Cardiovascular;;   Bilateral limited angio performed   RENAL ANGIOGRAPHY N/A 06/14/2017   Procedure: Renal Angiography;  Surgeon: Adrian Prows, MD;  Location: [facility] CV LAB;  Service: Cardiovascular;  Laterality: N/A;   TOTAL HIP ARTHROPLASTY     right     Social History:   reports that she quit smoking about 5 months ago. Her smoking use included cigarettes. She has a 50.00 pack-year smoking history. She has never used smokeless tobacco. She reports that she does not drink alcohol and does not use drugs.   Family History:  Her family history includes Aneurysm in her brother; Cancer in her sister; Heart attack in her father and mother; Heart disease in her father and mother; Hyperlipidemia in her father and mother; Hypertension in her father and mother. There is no history  of Colon cancer or Liver cancer.   Allergies No Known Allergies   Home Medications  Prior to Admission medications   Medication Sig Start Date End Date Taking? Authorizing Provider  acetaminophen (TYLENOL) 650 MG CR tablet Take 650 mg by mouth daily.   Yes [provider]  albuterol (VENTOLIN HFA) 108 (90 Base) MCG/ACT inhaler Inhale 2 puffs into the lungs every 4 (four) hours as needed for wheezing or shortness of breath.   Yes [provider]  amitriptyline (ELAVIL) 10 MG tablet Take 10 mg by mouth daily.   Yes [provider]  amLODipine (NORVASC) 10 MG tablet Take 1 tablet (10 mg total) by mouth daily. 09/18/21  Yes Little Ishikawa, MD  aspirin 81 MG EC tablet Take 1 tablet (81 mg total) by mouth daily. 01/09/19  Yes Gherghe, Vella Redhead, MD  Budeson-Glycopyrrol-Formoterol (BREZTRI AEROSPHERE) 160-9-4.8 MCG/ACT AERO Inhale 2 puffs into the lungs 2 (two) times daily. 07/23/21  Yes Tanda Rockers, MD  carvedilol (COREG) 12.5 MG tablet Take 1 tablet (12.5 mg total) by mouth 2 (two) times daily. 09/17/21  Yes Little Ishikawa, MD  cloNIDine (CATAPRES) 0.1 MG tablet Take 1 tablet (0.1 mg total) by mouth 2  (two) times daily. 09/17/21 11/16/21 Yes Little Ishikawa, MD  clopidogrel (PLAVIX) 75 MG tablet Take 1 tablet (75 mg total) by mouth daily. 01/09/19  Yes Caren Griffins, MD  diclofenac Sodium (VOLTAREN) 1 % GEL Apply 2 g topically every 6 (six) hours as needed (knee pain). 01/15/20  Yes [provider]  dicyclomine (BENTYL) 20 MG tablet Take 20 mg by mouth 3 (three) times daily. 04/01/20  Yes [provider]  docusate sodium (COLACE) 100 MG capsule Take 1 capsule (100 mg total) by mouth daily. 07/11/21  Yes Kc, Maren Beach, MD  donepezil (ARICEPT) 5 MG tablet Take 1 tablet (5 mg total) by mouth daily. 11/20/20  Yes Cameron Sprang, MD  Ferrous Sulfate (IRON HIGH-POTENCY) 325 MG TABS Take 325 mg by mouth daily with breakfast. Patient taking differently: Take 325 mg by mouth daily. 06/30/19  Yes Hongalgi, Lenis Dickinson, MD  hydrOXYzine (ATARAX/VISTARIL) 25 MG tablet Take 25 mg by mouth at bedtime. 05/12/20  Yes [provider]  lidocaine (LIDODERM) 5 % Place 1 patch onto the skin daily. 08/31/21  Yes [provider]  magnesium oxide (MAG-OX) 400 MG tablet Take 400 mg by mouth 2 (two) times daily.   Yes [provider]  megestrol (MEGACE) 40 MG tablet Take 40 mg by mouth 2 (two) times daily. 05/12/20  Yes [provider]  methocarbamol (ROBAXIN) 500 MG tablet Take 500 mg by mouth 3 (three) times daily.   Yes [provider]  Multiple Vitamins-Minerals (HAIR/SKIN/NAILS) TABS Take 1 tablet by mouth daily.   Yes [provider]  nitroGLYCERIN (NITROSTAT) 0.4 MG SL tablet Place 1 tablet (0.4 mg total) under the tongue every 5 (five) minutes as needed for chest pain. 01/07/20  Yes Allie Bossier, MD  pantoprazole (PROTONIX) 40 MG tablet Take 1 tablet (40 mg total) by mouth daily. 06/16/19  Yes Hongalgi, Lenis Dickinson, MD  pregabalin (LYRICA) 75 MG capsule Take 1 capsule (75 mg total) by mouth daily. 07/11/21 11/17/21 Yes Antonieta Pert, MD  sodium bicarbonate 650  MG tablet Take 2 tablets (1,300 mg total) by mouth 2 (two) times daily. 01/07/20  Yes Allie Bossier, MD  valproic acid (DEPAKENE) 250 MG capsule Take 250 mg by mouth at bedtime.  Yes [provider]  Ensure Plus (ENSURE PLUS) LIQD Take 237 mLs by mouth daily.    [provider]     Critical care time: Critical care time is 50 minutes.

## 2021-09-27 NOTE — Progress Notes (Signed)
NAME:  Alyssa [name], MRN:  [id number], DOB:  Jan 08, 1941, LOS: 1 ADMISSION DATE:  09/26/2021, CONSULTATION DATE:  09/26/2021 REFERRING MD: Emergency room, CHIEF COMPLAINT: Generalized fatigue  History of Present Illness:  This is a 80 year old black female who presented to the emergency room from home.  Patient states that over the past 1 week she has had increasing fatigue, mild dyspnea on exertion, lightheadedness.  She denies any chest pain/chest pressure/nausea or vomiting.  While in the emergency room she was found to have a heart rate of 30, creatinine of 4.4 and a potassium of 6.9.  She has a known history of chronic kidney disease and had been told in the past that they were considering dialysis. Heart rate remains in the mid 30s.  Blood pressures remained adequate.   Pertinent  Medical History  Chronic kidney disease HTN Tobacco use disorder 50 pack year smoking history   Significant Hospital Events: Including procedures, antibiotic start and stop dates in addition to other pertinent events   12/11 presented to ED with hyperkalemia  Interval events: She is now back in sinus rhythm.  Potassium reviewed - normalized to 4.9 She is feeling very cold.  Notes feeling a little    Objective   Blood pressure 118/69, pulse 63, temperature 97.7 F (36.5 C), temperature source Oral, resp. rate (!) 21, SpO2 100 %.        Intake/Output Summary (Last 24 hours) at 09/27/2021 1532 Last data filed at 09/27/2021 1100 Gross per 24 hour  Intake 3153.93 ml  Output 1250 ml  Net 1903.93 ml   There were no vitals filed for this visit.  Examination: Blood pressure 118/69, pulse 63, temperature 97.7 F (36.5 C), temperature source Oral, resp. rate (!) 21, SpO2 100 %. Vitals:   09/27/21 1300 09/27/21 1400  BP: 104/66 118/69  Pulse: (!) 59 63  Resp: 19 (!) 21  Temp:    SpO2: 100% 100%    Gen:      No acute distress, resting comfortably HEENT:  EOMI, sclera anicteric Neck:      No masses; no thyromegaly Lungs:    Clear to auscultation bilaterally; normal respiratory effort, stable on room air CV:         Regular rate and rhythm; no murmurs Abd:      Soft, nontender Ext:    Thin, no edema Skin:      Warm and dry; no rash Neuro:    alert and oriented x 3 Psych:    normal mood and affect    Assessment & Plan:  Symptomatic bradycardia Hyperkalemia - improved Acute on chronic kidney disease Hypertension  Plan: Likely symptomatic bradycardia secondary to hyperkalemia. There may have been component of dehydration given vague symptoms of malaise and fatigue with decreased oral intake over a week. Hyperkalemia improved with bicarb, dextrose, insulin, lasix, lokelma, calcium Stopped bicarb drip. Was given some gentle IVF - now [facility] Spoke to nephrology - nothing further to advise - she will follow up with Dr. Moshe Cipro her outpatient nephrologist.  She is not on any potassium sparing diuretics BP controlled off her home meds currently - may need those cautiously added back on prior to discharge.    Best Practice (right click and "Reselect all SmartList Selections" daily)   Diet/type: Regular consistency (see orders) DVT prophylaxis: prophylactic heparin  GI prophylaxis: PPI Lines: N/A Foley:  N/A Code Status:  DNR  Stable for transfer to med surg with telemetry given recent hyperkalemia. Trh to assume care  12/12. PCCM to see as needed.   Labs   CBC: Recent Labs  Lab 09/26/21 1721 09/26/21 2255 09/27/21 0659  WBC 9.2 6.3 8.2  NEUTROABS 5.6  --   --   HGB 11.5* 9.0* 10.5*  HCT 36.5 28.9* 31.4*  MCV 89.0 88.1 83.3  PLT 500* 295 444*    Basic Metabolic Panel: Recent Labs  Lab 09/26/21 1721 09/26/21 2255 09/27/21 0000 09/27/21 0659  NA 134*  --  137 136  K 6.9*  --  4.9 4.9  CL 108  --  102 103  CO2 14*  --  24 21*  GLUCOSE 130*  --  88 93  BUN 44*  --  37* 36*  CREATININE 4.37* 2.56* 3.80* 3.83*  CALCIUM 9.0  --  8.1* 8.3*  MG 3.4*  --    --  2.8*  PHOS  --   --   --  4.7*   GFR: CrCl cannot be calculated (Unknown ideal weight.). Recent Labs  Lab 09/26/21 1721 09/26/21 2255 09/27/21 0659  WBC 9.2 6.3 8.2    Liver Function Tests: No results for input(s): AST, ALT, ALKPHOS, BILITOT, PROT, ALBUMIN in the last 168 hours. No results for input(s): LIPASE, AMYLASE in the last 168 hours. No results for input(s): AMMONIA in the last 168 hours.  ABG    Component Value Date/Time   PHART 7.456 (H) 09/10/2021 1535   PCO2ART 26.3 (L) 09/10/2021 1535   PO2ART 131 (H) 09/10/2021 1535   HCO3 18.7 (L) 09/10/2021 1535   TCO2 19 (L) 09/10/2021 1535   ACIDBASEDEF 5.0 (H) 09/10/2021 1535   O2SAT 99.0 09/10/2021 1535     Coagulation Profile: No results for input(s): INR, PROTIME in the last 168 hours.  Cardiac Enzymes: No results for input(s): CKTOTAL, CKMB, CKMBINDEX, TROPONINI in the last 168 hours.  HbA1C: Hgb A1c MFr Bld  Date/Time Value Ref Range Status  06/21/2021 04:16 PM 6.7 (H) 4.8 - 5.6 % Final    Comment:    REPEATED TO VERIFY (NOTE) Pre diabetes:          5.7%-6.4%  Diabetes:              >6.4%  Glycemic control for   <7.0% adults with diabetes   06/07/2020 06:51 AM 6.2 (H) 4.8 - 5.6 % Final    Comment:    (NOTE) Pre diabetes:          5.7%-6.4%  Diabetes:              >6.4%  Glycemic control for   <7.0% adults with diabetes

## 2021-09-27 NOTE — Progress Notes (Signed)
[facility] Progress Note [name] DOB: September 25, 1941 MRN: [id number]   Date of Service  09/27/2021  HPI/Events of Note  Back pain reported 10/10.   eICU Interventions  Re-ordered home Lyrica and Robaxin        Janal Haak Rodman Pickle 09/27/2021, 9:46 PM

## 2021-09-27 NOTE — Progress Notes (Signed)
Progress Note  [name] Date of Encounter: 09/27/2021  Primary Cardiologist:   Shelva Majestic, MD   Subjective   Says her back is sore.  Denies chest pain or SOB  Inpatient Medications    Scheduled Meds:  Chlorhexidine Gluconate Cloth  6 each Topical Daily   heparin  5,000 Units Subcutaneous Q8H   pantoprazole (PROTONIX) IV  40 mg Intravenous QHS   Continuous Infusions:  calcium gluconate      sodium bicarbonate (isotonic) infusion in sterile water 125 mL/hr at 09/27/21 0400   PRN Meds: acetaminophen, docusate sodium, ondansetron (ZOFRAN) IV, polyethylene glycol   Vital Signs    Vitals:   09/27/21 0400 09/27/21 0429 09/27/21 0500 09/27/21 0739  BP: 120/72  115/78   Pulse: 72  69   Resp: 11  15   Temp:  97.8 F (36.6 C)  98.4 F (36.9 C)  TempSrc:  Oral  Oral  SpO2: 100%  100%     Intake/Output Summary (Last 24 hours) at 09/27/2021 0850 Last data filed at 09/27/2021 0500 Gross per 24 hour  Intake 2276.82 ml  Output 850 ml  Net 1426.82 ml   There were no vitals filed for this visit.  Telemetry    SB, NSR - Personally Reviewed  ECG    NA - Personally Reviewed  Physical Exam   GEN: No acute distress.   Neck: No  JVD Cardiac: RRR, no murmurs, rubs, or gallops.  Respiratory: Clear  to auscultation bilaterally. GI: Soft, nontender, non-distended  MS: No  edema; No deformity. Neuro:  Nonfocal  Psych: Normal affect   Labs    Chemistry Recent Labs  Lab 09/26/21 1721 09/26/21 2255 09/27/21 0000  NA 134*  --  137  K 6.9*  --  4.9  CL 108  --  102  CO2 14*  --  24  GLUCOSE 130*  --  88  BUN 44*  --  37*  CREATININE 4.37* 2.56* 3.80*  CALCIUM 9.0  --  8.1*  GFRNONAA 10* 19* 12*  ANIONGAP 12  --  11     Hematology Recent Labs  Lab 09/26/21 1721 09/26/21 2255 09/27/21 0659  WBC 9.2 6.3 8.2  RBC 4.10 3.28* 3.77*  HGB 11.5* 9.0* 10.5*  HCT 36.5 28.9* 31.4*  MCV 89.0 88.1 83.3  MCH 28.0 27.4 27.9  MCHC 31.5  31.1 33.4  RDW 18.1* 18.0* 17.2*  PLT 500* 295 444*    Cardiac EnzymesNo results for input(s): TROPONINI in the last 168 hours. No results for input(s): TROPIPOC in the last 168 hours.   BNPNo results for input(s): BNP, PROBNP in the last 168 hours.   DDimer No results for input(s): DDIMER in the last 168 hours.   Radiology    DG Chest Port 1 View  Result Date: 09/26/2021 CLINICAL DATA:  Weakness EXAM: PORTABLE CHEST 1 VIEW COMPARISON:  09/10/2021 FINDINGS: Stable cardiomediastinal contours. Probable mild atelectasis at the right lung base. No pleural effusion. IMPRESSION: Mild atelectasis right lung base. Electronically Signed   By: Macy Mis M.D.   On: 09/26/2021 17:50    Cardiac Studies   ECHO:  1. Left ventricular ejection fraction, by estimation, is 60 to 65%. The  left ventricle has normal function. The left ventricle has no regional  wall motion abnormalities. There is moderate concentric left ventricular  hypertrophy. Left ventricular  diastolic parameters are consistent with Grade I diastolic dysfunction  (impaired relaxation).   2. Right ventricular systolic function  is normal. The right ventricular  size is normal. There is normal pulmonary artery systolic pressure. The  estimated right ventricular systolic pressure is 46.9 mmHg.   3. The mitral valve is grossly normal. Trivial mitral valve  regurgitation. No evidence of mitral stenosis.   4. The aortic valve is tricuspid. There is mild calcification of the  aortic valve. There is mild thickening of the aortic valve. Aortic valve  regurgitation is trivial. Aortic valve sclerosis is present, with no  evidence of aortic valve stenosis.   5. The inferior vena cava is normal in size with greater than 50%  respiratory variability, suggesting right atrial pressure of 3 mmHg.   Patient Profile     80 y.o. female with a hx of CKD4, HTN with recent admission for hypertensive emergency, dementia, COPD, PVD who is being  seen 09/26/2021 for the evaluation of bradycardia at the request of Dr Thamas Jaegers, ED.  Assessment & Plan   BRADYCARDIA:  Secondary to meds and hyperkalemia.    Given bicarb, insulin , calcium gluconate.  Coreg held.    Still with bradycardia but improved.  No further pauses.  No indication for pacing.    HTN:  Normalized.     HYPERKALEMIA:  Corrected.    AKI:  Creat is elevated from baseline.  Renal consult pending.  KVO fluids with elevated BNP and history of diastolic dysfunction.    For questions or updates, please contact [place] Please consult [email or web address] for contact info under Cardiology/STEMI.   Signed, Minus Breeding, MD  09/27/2021, 8:50 AM

## 2021-09-28 ENCOUNTER — Encounter (HOSPITAL_COMMUNITY): Payer: Self-pay | Admitting: Pulmonary Disease

## 2021-09-28 ENCOUNTER — Ambulatory Visit: Payer: Medicare HMO | Admitting: Surgery

## 2021-09-28 MED ORDER — BISACODYL 10 MG RE SUPP
10.0000 mg | Freq: Once | RECTAL | Status: AC
  Administered 2021-09-28: 10 mg via RECTAL
  Filled 2021-09-28: qty 1

## 2021-09-28 MED ORDER — METHOCARBAMOL 500 MG PO TABS
500.0000 mg | ORAL_TABLET | Freq: Three times a day (TID) | ORAL | Status: DC
  Administered 2021-09-28 (×3): 500 mg via ORAL
  Filled 2021-09-28 (×2): qty 1

## 2021-09-28 MED ORDER — SODIUM CHLORIDE 0.9 % IV SOLN: INTRAVENOUS | Status: AC

## 2021-09-28 MED ORDER — SODIUM CHLORIDE 0.9 % IV SOLN: INTRAVENOUS | Status: DC

## 2021-09-28 MED ORDER — PANTOPRAZOLE SODIUM 40 MG PO TBEC
40.0000 mg | DELAYED_RELEASE_TABLET | Freq: Every day | ORAL | Status: DC
  Administered 2021-09-28 – 2021-10-08 (×11): 40 mg via ORAL
  Filled 2021-09-28 (×11): qty 1

## 2021-09-28 MED ORDER — POLYETHYLENE GLYCOL 3350 17 G PO PACK
17.0000 g | PACK | Freq: Two times a day (BID) | ORAL | Status: DC
  Administered 2021-09-28 – 2021-10-05 (×14): 17 g via ORAL
  Filled 2021-09-28 (×18): qty 1

## 2021-09-28 NOTE — Progress Notes (Signed)
[facility]  PROGRESS NOTE  Alyssa [name] WCB:[id number] DOB: 1941/10/11 DOA: 09/26/2021 PCP: System, Provider Not In   Brief HPI:   80 year old female with a history of hypertension, hyperlipidemia, PVD, stage IV CKD, COPD, dementia, who was recently discharged from the hospital on 09/17/2021 after she was treated for hypertensive emergency, came to ED with complaints of 1 week history of increasing fatigue, mild Exertion, lightheadedness.  In the ED she was found to have heart rate of 30, creatinine 4.4 and potassium 6.9.  Patient was admitted under CCM service and cardiology was consulted. Patient received sodium bicarbonate, Lokelma, albuterol.  Hyperkalemia resolved.  She was seen by cardiology for bradycardia.   Subjective   This morning patient complains of left upper quadrant pain.  Also complains of constipation   Assessment/Plan:     Acute kidney injury on CKD stage IV -Patient baseline creatinine is around 2.75-2.85 -Presented with creatinine of 4.4 -Today creatinine has improved to 3.83 -We will start IV normal saline at 75 mill per hour -Follow BMP in am, if no improvement in creatinine, consider nephrology consultation  Hyperkalemia -Patient presented with a potassium of 6.9, resolved after patient received Lokelma, sodium bicarb, albuterol, calcium gluconate -Today potassium is 4.9  Left upper quadrant pain -Patient had extensive work-up during previous admission at that time CT abdomen pelvis did not show significant abnormality -Patient is constipated -We will give Dulcolax suppository 10 mg x 1 -Also start MiraLAX 17 g p.o. 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          Medications     Chlorhexidine Gluconate Cloth  6 each Topical Daily   heparin  5,000 Units Subcutaneous Q8H   lidocaine  1 patch Transdermal Q24H   methocarbamol  500 mg Oral TID   pantoprazole  40 mg Oral QHS   polyethylene glycol  17 g Oral BID   pregabalin  75 mg Oral Daily     Data Reviewed:   CBG:  No results for input(s): GLUCAP in the last 168 hours.  SpO2: 98 % O2 Flow Rate (L/min): 2 L/min    Vitals:   09/28/21 1300 09/28/21 1400 09/28/21 1524 09/28/21 1533  BP:    116/70  Pulse: 73 72 71 76  Resp: 16 18 18 16   Temp:   98.3 F (36.8 C)   TempSrc:   Oral   SpO2: 97% 98% 97% 98%     Intake/Output Summary (Last 24 hours) at 09/28/2021 1550 Last data filed at 09/28/2021 0800 Gross per 24 hour  Intake 840 ml  Output 650 ml  Net 190 ml    12/10 1901 - 12/12 0700 In: 3253.9 [P.O.:840; I.V.:1363.9] Out: 1900 [Urine:1900]  There were no vitals filed for this visit.  Data Reviewed: Basic Metabolic Panel: Recent Labs  Lab 09/26/21 1721 09/26/21 2255 09/27/21 0000 09/27/21 0659  NA 134*  --  137 136  K 6.9*  --  4.9 4.9  CL 108  --  102 103  CO2 14*  --  24 21*  GLUCOSE 130*  --  88 93  BUN 44*  --  37* 36*  CREATININE 4.37* 2.56* 3.80* 3.83*  CALCIUM 9.0  --  8.1* 8.3*  MG 3.4*  --   --  2.8*  PHOS  --   --   --  4.7*   Liver Function Tests: No results for input(s): AST, ALT, ALKPHOS, BILITOT, PROT, ALBUMIN in the last 168 hours. No results for input(s): LIPASE, AMYLASE in the last 168  hours. No results for input(s): AMMONIA in the last 168 hours. CBC: Recent Labs  Lab 09/26/21 1721 09/26/21 2255 09/27/21 0659  WBC 9.2 6.3 8.2  NEUTROABS 5.6  --   --   HGB 11.5* 9.0* 10.5*  HCT 36.5 28.9* 31.4*  MCV 89.0 88.1 83.3  PLT 500* 295 444*   Cardiac Enzymes: No results for input(s): CKTOTAL, CKMB, CKMBINDEX, TROPONINI in the last 168 hours. BNP (last 3 results) Recent Labs    09/10/21 1446  BNP 1,006.5*    ProBNP (last 3 results) No results for input(s): PROBNP in the last 8760 hours.  CBG: No results for input(s): GLUCAP in the last 168 hours.     Radiology Reports  DG Chest Port 1 View  Result Date: 09/26/2021 CLINICAL DATA:  Weakness EXAM: PORTABLE CHEST 1 VIEW COMPARISON:  09/10/2021 FINDINGS: Stable cardiomediastinal contours. Probable mild atelectasis at the right lung base. No pleural effusion. IMPRESSION: Mild atelectasis right lung base. Electronically Signed   By: Macy Mis M.D.   On: 09/26/2021 17:50       Antibiotics: Anti-infectives (From admission, onward)    None         DVT prophylaxis: Heparin  Code Status: DNR  Family Communication: No family at bedside   Consultants: Cardiology PCCM  Procedures:     Objective    Physical Examination:   General-appears in no acute distress Heart-S1-S2, regular, no murmur auscultated Lungs-clear to auscultation bilaterally, no wheezing or crackles auscultated Abdomen-soft, positive left upper quadrant tenderness to palpation,  no organomegaly Extremities-no edema in the lower extremities Neuro-alert, oriented x3, no focal deficit noted Status is: Inpatient  Dispo: The patient is from: Home              Anticipated d/c is to: Home              Anticipated d/c date is: 09/30/2021              Patient currently not stable for discharge  Barrier to discharge-ongoing treatment for acute kidney injury  COVID-19 Labs  No results for input(s): DDIMER, FERRITIN, LDH,  CRP in the last 72 hours.  Lab Results  Component Value Date   SARSCOV2NAA NEGATIVE 09/26/2021   SARSCOV2NAA NEGATIVE 09/10/2021   [place] NEGATIVE 07/10/2021   [place] NEGATIVE 06/15/2021            Recent Results (from the past 240 hour(s))  Resp Panel by RT-PCR (Flu A&B, Covid) Nasopharyngeal Swab     Status: None   Collection Time: 09/26/21  6:22 PM   Specimen: Nasopharyngeal Swab; Nasopharyngeal(NP) swabs in vial transport medium  Result Value Ref Range Status   SARS Coronavirus 2 by RT PCR NEGATIVE NEGATIVE Final    Comment: (NOTE) SARS-CoV-2 target nucleic acids are NOT DETECTED.  The SARS-CoV-2 RNA is generally detectable in upper respiratory specimens during  the acute phase of infection. The lowest concentration of SARS-CoV-2 viral copies this assay can detect is 138 copies/mL. A negative result does not preclude SARS-Cov-2 infection and should not be used as the sole basis for treatment or other patient management decisions. A negative result may occur with  improper specimen collection/handling, submission of specimen other than nasopharyngeal swab, presence of viral mutation(s) within the areas targeted by this assay, and inadequate number of viral copies(<138 copies/mL). A negative result must be combined with clinical observations, patient history, and epidemiological information. The expected result is Negative.  Fact Sheet for Patients:  EntrepreneurPulse.com.au  Fact Sheet for Healthcare Providers:  IncredibleEmployment.be  This test is no t yet approved or cleared by the [place] FDA and  has been authorized for detection and/or diagnosis of SARS-CoV-2 by FDA under an Emergency Use Authorization (EUA). This EUA will remain  in effect (meaning this test can be used) for the duration of the COVID-19 declaration under Section 564(b)(1) of the Act, 21 U.S.C.section 360bbb-3(b)(1), unless the authorization  is terminated  or revoked sooner.       Influenza A by PCR NEGATIVE NEGATIVE Final   Influenza B by PCR NEGATIVE NEGATIVE Final    Comment: (NOTE) The Xpert Xpress SARS-CoV-2/FLU/RSV plus assay is intended as an aid in the diagnosis of influenza from Nasopharyngeal swab specimens and should not be used as a sole basis for treatment. Nasal washings and aspirates are unacceptable for Xpert Xpress SARS-CoV-2/FLU/RSV testing.  Fact Sheet for Patients: EntrepreneurPulse.com.au  Fact Sheet for Healthcare Providers: IncredibleEmployment.be  This test is not yet approved or cleared by the [place] FDA and has been authorized for detection and/or diagnosis of SARS-CoV-2 by FDA under an Emergency Use Authorization (EUA). This EUA will remain in effect (meaning this test can be used) for the duration of the COVID-19 declaration under Section 564(b)(1) of the Act, 21 U.S.C. section 360bbb-3(b)(1), unless the authorization is terminated or revoked.  Performed at [facility], [place] [address], [place], [place] [address]   MRSA Next Gen by PCR, Nasal     Status: None   Collection Time: 09/26/21 11:25 PM   Specimen: Nasal Mucosa; Nasal Swab  Result Value Ref Range Status   MRSA by PCR Next Gen NOT DETECTED NOT DETECTED Final    Comment: (NOTE) The GeneXpert MRSA Assay (FDA approved for NASAL specimens only), is one component of a comprehensive MRSA colonization surveillance program. It is not intended to diagnose MRSA infection nor to guide or monitor treatment for MRSA infections. Test performance is not FDA approved in patients less than 20 years old. Performed at [facility], [place] [address] [place], [place] [address]     [facility] If 7PM-7AM, please contact night-coverage at [email or web address], Office  [phone]   09/28/2021, 3:50 PM  LOS: 2 days

## 2021-09-28 NOTE — Progress Notes (Signed)
Progress Note  [name] Date of Encounter: 09/28/2021  [facility] Cardiologist: Shelva Majestic, MD   Subjective   No SOB, admits to chest pain that comes at different times and lasts about an hour.  Thinks last episode was yesterday.    Inpatient Medications    Scheduled Meds:  Chlorhexidine Gluconate Cloth  6 each Topical Daily   heparin  5,000 Units Subcutaneous Q8H   lidocaine  1 patch Transdermal Q24H   pantoprazole (PROTONIX) IV  40 mg Intravenous QHS   pregabalin  75 mg Oral Daily   Continuous Infusions:  calcium gluconate     PRN Meds: acetaminophen, docusate sodium, methocarbamol, ondansetron (ZOFRAN) IV, polyethylene glycol   Vital Signs    Vitals:   09/28/21 0300 09/28/21 0400 09/28/21 0500 09/28/21 0600  BP: 115/82 98/64 101/66 105/71  Pulse: 63  61 (!) 59  Resp: 16 15 15 14   Temp: 98.5 F (36.9 C)     TempSrc: Oral     SpO2: 100% 98% 100% 100%    Intake/Output Summary (Last 24 hours) at 09/28/2021 0803 Last data filed at 09/28/2021 0600 Gross per 24 hour  Intake 1477.11 ml  Output 1050 ml  Net 427.11 ml   Last 3 Weights 09/10/2021 08/07/2021 07/23/2021  Weight (lbs) 95 lb 0.3 oz 95 lb 97 lb  Weight (kg) 43.1 kg 43.092 kg 43.999 kg      Telemetry    SR to SB - Personally Reviewed  ECG     No new - Personally Reviewed  Physical Exam   GEN: No acute distress.   Neck: No JVD Cardiac: RRR, no murmurs, rubs, or gallops.  Respiratory: Clear to auscultation bilaterally. GI: Soft, nontender, non-distended  MS: No edema; No deformity. Neuro:  Nonfocal  Psych: Normal affect   Labs    High Sensitivity Troponin:   Recent Labs  Lab 09/10/21 1446 09/10/21 1635 09/26/21 1721 09/26/21 2255  TROPONINIHS 188* 169* 26* 17     Chemistry Recent Labs  Lab 09/26/21 1721 09/26/21 2255 09/27/21 0000 09/27/21 0659  NA 134*  --  137 136  K 6.9*  --  4.9 4.9  CL 108  --  102 103  CO2 14*  --  24 21*  GLUCOSE  130*  --  88 93  BUN 44*  --  37* 36*  CREATININE 4.37* 2.56* 3.80* 3.83*  CALCIUM 9.0  --  8.1* 8.3*  MG 3.4*  --   --  2.8*  GFRNONAA 10* 19* 12* 12*  ANIONGAP 12  --  11 12    Lipids No results for input(s): CHOL, TRIG, HDL, LABVLDL, LDLCALC, CHOLHDL in the last 168 hours.  Hematology Recent Labs  Lab 09/26/21 1721 09/26/21 2255 09/27/21 0659  WBC 9.2 6.3 8.2  RBC 4.10 3.28* 3.77*  HGB 11.5* 9.0* 10.5*  HCT 36.5 28.9* 31.4*  MCV 89.0 88.1 83.3  MCH 28.0 27.4 27.9  MCHC 31.5 31.1 33.4  RDW 18.1* 18.0* 17.2*  PLT 500* 295 444*   Thyroid No results for input(s): TSH, FREET4 in the last 168 hours.  BNPNo results for input(s): BNP, PROBNP in the last 168 hours.  DDimer No results for input(s): DDIMER in the last 168 hours.   Radiology    DG Chest Port 1 View  Result Date: 09/26/2021 CLINICAL DATA:  Weakness EXAM: PORTABLE CHEST 1 VIEW COMPARISON:  09/10/2021 FINDINGS: Stable cardiomediastinal contours. Probable mild atelectasis at the right lung base. No pleural effusion.  IMPRESSION: Mild atelectasis right lung base. Electronically Signed   By: Macy Mis M.D.   On: 09/26/2021 17:50    Cardiac Studies   Echo 09/13/21 IMPRESSIONS     1. Left ventricular ejection fraction, by estimation, is 60 to 65%. The  left ventricle has normal function. The left ventricle has no regional  wall motion abnormalities. There is moderate concentric left ventricular  hypertrophy. Left ventricular  diastolic parameters are consistent with Grade I diastolic dysfunction  (impaired relaxation).   2. Right ventricular systolic function is normal. The right ventricular  size is normal. There is normal pulmonary artery systolic pressure. The  estimated right ventricular systolic pressure is 48.2 mmHg.   3. The mitral valve is grossly normal. Trivial mitral valve  regurgitation. No evidence of mitral stenosis.   4. The aortic valve is tricuspid. There is mild calcification of the  aortic  valve. There is mild thickening of the aortic valve. Aortic valve  regurgitation is trivial. Aortic valve sclerosis is present, with no  evidence of aortic valve stenosis.   5. The inferior vena cava is normal in size with greater than 50%  respiratory variability, suggesting right atrial pressure of 3 mmHg.   Comparison(s): Changes from prior study are noted. EF unchanged. LVH  appears moderate on this study.   FINDINGS   Left Ventricle: Left ventricular ejection fraction, by estimation, is 60  to 65%. The left ventricle has normal function. The left ventricle has no  regional wall motion abnormalities. The left ventricular internal cavity  size was normal in size. There is   moderate concentric left ventricular hypertrophy. Left ventricular  diastolic parameters are consistent with Grade I diastolic dysfunction  (impaired relaxation).   Right Ventricle: The right ventricular size is normal. No increase in  right ventricular wall thickness. Right ventricular systolic function is  normal. There is normal pulmonary artery systolic pressure. The tricuspid  regurgitant velocity is 2.47 m/s, and   with an assumed right atrial pressure of 3 mmHg, the estimated right  ventricular systolic pressure is 50.0 mmHg.   Left Atrium: Left atrial size was normal in size.   Right Atrium: Right atrial size was normal in size.   Pericardium: Trivial pericardial effusion is present.   Mitral Valve: The mitral valve is grossly normal. Trivial mitral valve  regurgitation. No evidence of mitral valve stenosis.   Patient Profile     80 y.o. female with a hx of CKD4, HTN with recent admission for hypertensive emergency, dementia, COPD, PVD admitted with AKI, hyperkalemia and bradycardia.  Recent admit 08/2021 with elevated troponin with hypertensive urgency abnormal EKG and LLL collapse with COPD and active herpes infection.  .   Assessment & Plan    BRADYCARDIA:  Secondary to meds and hyperkalemia.     Given bicarb, insulin , calcium gluconate.  Coreg held.    Still with bradycardia but improved.  No further pauses.  No indication for pacing.   --hx of NSTEMI but have been unable to do ischemic work up due to [facility] --Dr. Claiborne Billings considered PYP scan to exclude ATTR cardiac amyloid but I do not see that it was arranged   HTN:  Normalized.      HYPERKALEMIA:  Corrected.   was 6.9 on admit today 4.9   AKI:  Creat is elevated from baseline.  Renal consult pending.  KVO fluids with elevated BNP and history of diastolic dysfunction  Cr was 4.37 now 3.83 (baseline Cr 2.75 to 2.85)  Dementia/malnutrition/anemia/COPD of chronic disease, transfused 1 unit on last admit  Episodic chest pain --hs troponin 17 and 26 though 2 weeks ago 169 to 188  --on amlodipine 10 coreg prior to admit 12.5 BID, ASA 81 and plavix 75    For questions or updates, please contact [place] Please consult [email or web address] for contact info under        Signed, Cecilie Kicks, NP  09/28/2021, 8:03 AM

## 2021-09-28 NOTE — Plan of Care (Signed)

## 2021-09-28 NOTE — Progress Notes (Signed)
  Transition of Care [facility]) Screening Note   Patient Details  Name: REHEMA MUFFLEY Date of Birth: [birth year]   Transition of Care [facility]) CM/SW Contact:    Milas Gain, [place] Phone Number: 09/28/2021, 4:52 PM    Transition of Care Department [facility]) has reviewed patient and no TOC needs have been identified at this time. We will continue to monitor patient advancement through interdisciplinary progression rounds. If new patient transition needs arise, please place a TOC consult.

## 2021-09-29 ENCOUNTER — Inpatient Hospital Stay (HOSPITAL_COMMUNITY)
Admission: RE | Admit: 2021-09-29 | Discharge: 2021-09-29 | Disposition: A | Discharge status: Home / Self Care | Payer: Medicare HMO | Source: Ambulatory Visit

## 2021-09-29 DIAGNOSIS — T829XXA Unspecified complication of cardiac and vascular prosthetic device, implant and graft, initial encounter: Secondary | ICD-10-CM | POA: Diagnosis present

## 2021-09-29 LAB — BASIC METABOLIC PANEL
Anion gap: 10 (ref 5–15)
Anion gap: 9 (ref 5–15)
Anion gap: 11 (ref 5–15)
BUN: 44 mg/dL — ABNORMAL HIGH (ref 8–23)
BUN: 44 mg/dL — ABNORMAL HIGH (ref 8–23)
BUN: 43 mg/dL — ABNORMAL HIGH (ref 8–23)
CO2: 19 mmol/L — ABNORMAL LOW (ref 22–32)
CO2: 20 mmol/L — ABNORMAL LOW (ref 22–32)
CO2: 20 mmol/L — ABNORMAL LOW (ref 22–32)
Calcium: 7.8 mg/dL — ABNORMAL LOW (ref 8.9–10.3)
Calcium: 8.5 mg/dL — ABNORMAL LOW (ref 8.9–10.3)
Calcium: 9 mg/dL (ref 8.9–10.3)
Chloride: 101 mmol/L (ref 98–111)
Chloride: 99 mmol/L (ref 98–111)
Chloride: 100 mmol/L (ref 98–111)
Creatinine, Ser: 3.52 mg/dL — ABNORMAL HIGH (ref 0.44–1.00)
Creatinine, Ser: 3.85 mg/dL — ABNORMAL HIGH (ref 0.44–1.00)
Creatinine, Ser: 3.63 mg/dL — ABNORMAL HIGH (ref 0.44–1.00)
GFR, Estimated: 13 mL/min — ABNORMAL LOW (ref >60)
GFR, Estimated: 11 mL/min — ABNORMAL LOW (ref >60)
GFR, Estimated: 12 mL/min — ABNORMAL LOW (ref >60)
Glucose, Bld: 107 mg/dL — ABNORMAL HIGH (ref 70–99)
Glucose, Bld: 82 mg/dL (ref 70–99)
Glucose, Bld: 117 mg/dL — ABNORMAL HIGH (ref 70–99)
Potassium: 6.7 mmol/L (ref 3.5–5.1)
Potassium: 6.4 mmol/L (ref 3.5–5.1)
Potassium: 5.5 mmol/L — ABNORMAL HIGH (ref 3.5–5.1)
Sodium: 130 mmol/L — ABNORMAL LOW (ref 135–145)
Sodium: 128 mmol/L — ABNORMAL LOW (ref 135–145)
Sodium: 131 mmol/L — ABNORMAL LOW (ref 135–145)

## 2021-09-29 LAB — POTASSIUM
Potassium: 5.2 mmol/L — ABNORMAL HIGH (ref 3.5–5.1)
Potassium: 4.9 mmol/L (ref 3.5–5.1)

## 2021-09-29 LAB — MAGNESIUM: Magnesium: 2.7 mg/dL — ABNORMAL HIGH (ref 1.7–2.4)

## 2021-09-29 LAB — GLUCOSE, CAPILLARY
Glucose-Capillary: 84 mg/dL (ref 70–99)
Glucose-Capillary: 155 mg/dL — ABNORMAL HIGH (ref 70–99)
Glucose-Capillary: 145 mg/dL — ABNORMAL HIGH (ref 70–99)
Glucose-Capillary: 97 mg/dL (ref 70–99)
Glucose-Capillary: 98 mg/dL (ref 70–99)

## 2021-09-29 MED ORDER — CALCIUM GLUCONATE-NACL 1-0.675 GM/50ML-% IV SOLN
1.0000 g | Freq: Once | INTRAVENOUS | Status: AC
  Administered 2021-09-29: 1000 mg via INTRAVENOUS
  Filled 2021-09-29: qty 50

## 2021-09-29 MED ORDER — RENA-VITE PO TABS
1.0000 | ORAL_TABLET | Freq: Every day | ORAL | Status: DC
  Administered 2021-09-29 – 2021-10-08 (×10): 1 via ORAL
  Filled 2021-09-29 (×10): qty 1

## 2021-09-29 MED ORDER — SODIUM POLYSTYRENE SULFONATE 15 GM/60ML PO SUSP
30.0000 g | Freq: Once | ORAL | Status: AC
  Administered 2021-09-29: 30 g via ORAL
  Filled 2021-09-29: qty 120

## 2021-09-29 MED ORDER — INSULIN ASPART 100 UNIT/ML IV SOLN
5.0000 [IU] | Freq: Once | INTRAVENOUS | Status: AC
  Administered 2021-09-29: 5 [IU] via INTRAVENOUS

## 2021-09-29 MED ORDER — SODIUM CHLORIDE 0.9 % IV SOLN: 250.0000 mL | INTRAVENOUS | Status: DC

## 2021-09-29 MED ORDER — NEPRO/CARBSTEADY PO LIQD
237.0000 mL | Freq: Two times a day (BID) | ORAL | Status: DC
  Administered 2021-09-29 – 2021-10-08 (×16): 237 mL via ORAL

## 2021-09-29 MED ORDER — DOPAMINE-DEXTROSE 3.2-5 MG/ML-% IV SOLN
1.5000 ug/kg/min | INTRAVENOUS | Status: DC
  Administered 2021-09-29: 1.5 ug/kg/min via INTRAVENOUS

## 2021-09-29 MED ORDER — DEXTROSE 50 % IV SOLN
1.0000 | Freq: Once | INTRAVENOUS | Status: AC
  Administered 2021-09-29: 50 mL via INTRAVENOUS
  Filled 2021-09-29: qty 50

## 2021-09-29 MED ORDER — SODIUM ZIRCONIUM CYCLOSILICATE 10 G PO PACK
10.0000 g | PACK | Freq: Once | ORAL | Status: AC
  Administered 2021-09-29: 10 g via ORAL
  Filled 2021-09-29: qty 1

## 2021-09-29 MED ORDER — CALCIUM GLUCONATE 10 % IV SOLN
1.0000 g | Freq: Once | INTRAVENOUS | Status: DC
  Filled 2021-09-29: qty 10

## 2021-09-29 MED ORDER — OXYCODONE HCL 5 MG PO TABS
5.0000 mg | ORAL_TABLET | Freq: Four times a day (QID) | ORAL | Status: AC | PRN
  Administered 2021-09-29 – 2021-10-01 (×7): 5 mg via ORAL
  Filled 2021-09-29 (×7): qty 1

## 2021-09-29 MED ORDER — SODIUM BICARBONATE 8.4 % IV SOLN
50.0000 meq | Freq: Once | INTRAVENOUS | Status: AC
  Administered 2021-09-29: 50 meq via INTRAVENOUS
  Filled 2021-09-29: qty 50

## 2021-09-29 MED ORDER — ALBUTEROL SULFATE (2.5 MG/3ML) 0.083% IN NEBU
INHALATION_SOLUTION | RESPIRATORY_TRACT | Status: AC
  Filled 2021-09-29: qty 6

## 2021-09-29 MED ORDER — NOREPINEPHRINE 4 MG/250ML-% IV SOLN
2.0000 ug/min | INTRAVENOUS | Status: DC
  Administered 2021-09-29: 2 ug/min via INTRAVENOUS
  Filled 2021-09-29: qty 500

## 2021-09-29 MED ORDER — ALBUTEROL SULFATE (2.5 MG/3ML) 0.083% IN NEBU
10.0000 mg | INHALATION_SOLUTION | Freq: Once | RESPIRATORY_TRACT | Status: AC
  Administered 2021-09-29: 10 mg via RESPIRATORY_TRACT
  Filled 2021-09-29: qty 12

## 2021-09-29 MED ORDER — DOPAMINE-DEXTROSE 3.2-5 MG/ML-% IV SOLN
INTRAVENOUS | Status: AC
  Filled 2021-09-29: qty 250

## 2021-09-29 NOTE — Progress Notes (Signed)
Initial Nutrition Assessment  DOCUMENTATION CODES:   Severe malnutrition in context of chronic illness  INTERVENTION:   Nepro Shake po BID, each supplement provides 425 kcal and 19 grams protein  Renal MVI daily  NUTRITION DIAGNOSIS:   Moderate Malnutrition related to chronic illness (dementia, CKD IV) as evidenced by severe fat depletion, severe muscle depletion.  GOAL:   Patient will meet greater than or equal to 90% of their needs  MONITOR:   PO intake, Supplement acceptance, Weight trends, Labs  REASON FOR ASSESSMENT:   Malnutrition Screening Tool    ASSESSMENT:   80 yo female admitted with AKI on CKD IV with hyperkalemia, bradycardia. PMH includes CKD IV, HTN, HLD, PVD, dementia  Recorded po intake 20-50% of meals; pt did not eat much lunch today but reports lunch was cold.  Pt reports appetite improving at home prior to admission; pt eats 2 meals per day and drinks 2 Ensures per day as well. Pt eat eggs/bacon for breakfast, sandwich for lunch. Pt does not eat dinner but might snack on PB crackers  Pt reports no recent wt loss; reports weight has been stable around 95 pounds, current wt 106.5 pounds  Labs: sodium 131 (L), potasium 5.5 (H)-down from 6.7 (H) Meds: miralax  NUTRITION - FOCUSED PHYSICAL EXAM:  Flowsheet Row Most Recent Value  Orbital Region Moderate depletion  Upper Arm Region Severe depletion  Thoracic and Lumbar Region Severe depletion  Buccal Region Moderate depletion  Temple Region Moderate depletion  Clavicle Bone Region Severe depletion  Clavicle and Acromion Bone Region Severe depletion  Scapular Bone Region Severe depletion  Dorsal Hand Moderate depletion  Patellar Region Moderate depletion  Anterior Thigh Region Moderate depletion  Posterior Calf Region Moderate depletion  Edema (RD Assessment) None       Diet Order:   Diet Order             Diet 2 gram sodium Room service appropriate? Yes; Fluid consistency: Thin  Diet  effective now                   EDUCATION NEEDS:   Not appropriate for education at this time  Skin:  Skin Assessment: Reviewed RN Assessment  Last BM:  12/13  Height:   Ht Readings from Last 1 Encounters:  09/10/21 5\' 3"  (1.6 m)    Weight:   Wt Readings from Last 1 Encounters:  09/29/21 48.3 kg    BMI:  Body mass index is 18.86 kg/m.  Estimated Nutritional Needs:   Kcal:  1600-1800 kcals  Protein:  70-80 g  Fluid:  >/= 1.6 L   Kerman Passey MS, RDN, LDN, CNSC Registered Dietitian III Clinical Nutrition RD Pager and On-Call Pager Number Located in [place]

## 2021-09-29 NOTE — Progress Notes (Signed)
[facility] Progress Note [name] DOB: 05/11/1941 MRN: [id number]   Date of Service  09/29/2021  HPI/Events of Note  K+ 6.4 after initial treatment.  eICU Interventions  Hyperkalemia treatment orders entered.        Kerry Kass Torence Palmeri 09/29/2021, 6:57 AM

## 2021-09-29 NOTE — Progress Notes (Signed)
An USGPIV (ultrasound guided PIV) has been placed for short-term vasopressor infusion. A correctly placed ivWatch must be used when administering Vasopressors. Should this treatment be needed beyond 72 hours, central line access should be obtained.  It will be the responsibility of the bedside nurse to follow best practice to prevent extravasations.   ?

## 2021-09-29 NOTE — Progress Notes (Signed)
[facility] Progress Note [name] DOB: 1941-08-20 MRN: [id number]   Date of Service  09/29/2021  HPI/Events of Note  K+ 6.7, patient is hypotensive.  eICU Interventions  Modified hyperkalemia treatment protocol ordered,  Dopamine discontinued, Norepinephrine gtt ordered.        Kerry Kass Tahmid Stonehocker 09/29/2021, 2:52 AM

## 2021-09-29 NOTE — Progress Notes (Signed)
PROGRESS NOTE  Alyssa [name] BJS:[id number] DOB: 06-26-1941 DOA: 09/26/2021 PCP: System, Provider Not In  Brief History   80 year old female with a history of hypertension, hyperlipidemia, PVD, stage IV CKD, COPD, dementia, who was recently discharged from the hospital on 09/17/2021 after she was treated for hypertensive emergency, came to ED with complaints of 1 week history of increasing fatigue, mild Exertion, lightheadedness.  In the ED she was found to have heart rate of 30, creatinine 4.4 and potassium 6.9.  Patient was admitted under CCM service and cardiology was consulted. She has been transferred to step down status. This morning he received another dose of lokelma. He remains hyperkalemic at 5.5 and bradycardic. She is not symptomatic. Monitor potassium.  Consultants  Nephrology Cardiology  Procedures  None  Antibiotics   Anti-infectives (From admission, onward)    None      Subjective  The patient is resting comfortably. No new complaints. Not symptomatic from bradycardia.  Objective   Vitals:  Vitals:   09/29/21 1115 09/29/21 1130  BP: 121/73 118/72  Pulse: 80 80  Resp: (!) 21 20  Temp:    SpO2: 96% 99%    Exam:  Constitutional:  The patient is awake, alert, and oriented x 3. No acute distress. Respiratory:  No increased work of breathing. No wheezes, rales, or rhonchi No tactile fremitus Cardiovascular:  Regular rate and rhythm, slow No murmurs, ectopy, or gallups. No lateral PMI. No thrills. Abdomen:  Abdomen is soft, non-tender, non-distended No hernias, masses, or organomegaly Normoactive bowel sounds.  Musculoskeletal:  No cyanosis, clubbing, or edema Skin:  No rashes, lesions, ulcers palpation of skin: no induration or nodules Neurologic:  CN 2-12 intact Sensation all 4 extremities intact Psychiatric:  Mental status Mood, affect appropriate Orientation to person, place, time  judgment and insight appear intact   I have  personally reviewed the following:   Today's Data  Vitals  Lab Data  BMP  Micro Data  MRSA screen negative  Imaging  CXR  Cardiology Data  EKG  Scheduled Meds:  albuterol       Chlorhexidine Gluconate Cloth  6 each Topical Daily   feeding supplement (NEPRO CARB STEADY)  237 mL Oral BID BM   heparin  5,000 Units Subcutaneous Q8H   lidocaine  1 patch Transdermal Q24H   pantoprazole  40 mg Oral QHS   polyethylene glycol  17 g Oral BID   pregabalin  75 mg Oral Daily   Continuous Infusions:  sodium chloride     norepinephrine (LEVOPHED) Adult infusion Stopped (09/29/21 1045)    Problem  Complications Due to Renal Dialysis Device, Implant, and Graft  Bradycardia With 31-40 Beats Per Minute  Essential Hypertension  Acute Renal Failure Superimposed On Chronic Kidney Disease (Hcc)  Hyperkalemia      LOS: 3 days   A & P  Acute kidney injury on CKD stage IV -Patient baseline creatinine is around 2.75-2.85 -Presented with creatinine of 4.4 -Today creatinine has improved to 3.63 -Nephrology has been consulted.   Hyperkalemia -Patient presented with a potassium of 6.9, back up to 6.4 yesterday. She has received Lokelma x 2, sodium bicarb, albuterol, calcium gluconate. 5.5 Currently. Continue to monitor and treat.   Left upper quadrant pain -Patient had extensive work-up during previous admission at that time CT abdomen pelvis did not show significant abnormality -Patient is constipated -We will give Dulcolax suppository 10 mg x 1 -Also start MiraLAX 17 g p.o. twice daily -BM x 2 on 09/28/2021  Bradycardia -Ongoing as is hypokalemia. Last dose of Lokelma at 0400 am. Potassium is currently 5.5. 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     I have seen and examined this patient myself. I have spent 35 minutes in her evaluation and care.  DVT prophylaxis: Heparin Code Status: DNR Family Communication: None available Disposition Plan: Home    Kamaal Cast, DO [facility] Direct contact: see [email or web address]  7PM-7AM contact night coverage as above 09/29/2021, 2:56 PM  LOS: 3 days

## 2021-09-29 NOTE — Progress Notes (Addendum)
[facility] Progress Note [name] DOB: 13-Jun-1941 MRN: [id number]   Date of Service  09/29/2021  HPI/Events of Note  Heart rate 30, BP 107/74.  eICU Interventions  Stat BMP and Mg++ ordered, Dopamine gtt ordered at a fixed dose of 1.5 mcg / kg / min pending electrolyte results. Robaxin discontinued.        Alyssa [name] Alyssa [name] 09/29/2021, 1:36 AM

## 2021-09-29 NOTE — Assessment & Plan Note (Signed)
Hematoma vs pseudoaneurysm. Monitor.

## 2021-09-29 NOTE — Progress Notes (Signed)
[facility] Progress Note Patient Name: NIVEA WOJDYLA DOB: Jun 06, 1941 MRN: [id number]   Date of Service  09/29/2021  HPI/Events of Note  Patient requesting analgesic.  eICU Interventions  PRN Oxycodone 5 mg tab Q 6 hours ordered.        Kerry Kass Jaysun Wessels 09/29/2021, 8:18 PM

## 2021-09-29 NOTE — Progress Notes (Signed)
Progress Note  [name] Date of Encounter: 09/29/2021  [facility] Cardiologist: Shelva Majestic, MD   Subjective   Comfortable getting breathing Rx but K up again and bradycardic   Inpatient Medications    Scheduled Meds:  albuterol       Chlorhexidine Gluconate Cloth  6 each Topical Daily   insulin aspart  5 Units Intravenous Once   And   dextrose  1 ampule Intravenous Once   heparin  5,000 Units Subcutaneous Q8H   lidocaine  1 patch Transdermal Q24H   pantoprazole  40 mg Oral QHS   polyethylene glycol  17 g Oral BID   pregabalin  75 mg Oral Daily   sodium bicarbonate  50 mEq Intravenous Once   sodium polystyrene  30 g Oral Once   Continuous Infusions:  sodium chloride 75 mL/hr at 09/29/21 0800   sodium chloride     calcium gluconate     DOPamine     norepinephrine (LEVOPHED) Adult infusion 1.5 mcg/min (09/29/21 0800)   PRN Meds: acetaminophen, docusate sodium, ondansetron (ZOFRAN) IV   Vital Signs    Vitals:   09/29/21 0745 09/29/21 0800 09/29/21 0807 09/29/21 0815  BP: (!) 118/57 (!) 124/56  94/61  Pulse: (!) 41 (!) 43 (!) 42 (!) 41  Resp: 18 (!) 21 16 13   Temp:  97.8 F (36.6 C)    TempSrc:  Tympanic    SpO2: 99% 98% 100% 100%  Weight:        Intake/Output Summary (Last 24 hours) at 09/29/2021 0915 Last data filed at 09/29/2021 0800 Gross per 24 hour  Intake 1830.42 ml  Output 250 ml  Net 1580.42 ml   Last 3 Weights 09/29/2021 09/10/2021 08/07/2021  Weight (lbs) 106 lb 7.7 oz 95 lb 0.3 oz 95 lb  Weight (kg) 48.3 kg 43.1 kg 43.092 kg      Telemetry    SR to SB  periods of junctional   ECG     No new - Personally Reviewed  Physical Exam   GEN: No acute distress.   Neck: No JVD Cardiac: RRR, no murmurs, rubs, or gallops.  Respiratory: Clear to auscultation bilaterally. GI: Soft, nontender, non-distended  MS: No edema; No deformity. Neuro:  Nonfocal  Psych: Normal affect   Labs    High Sensitivity  Troponin:   Recent Labs  Lab 09/10/21 1446 09/10/21 1635 09/26/21 1721 09/26/21 2255  TROPONINIHS 188* 169* 26* 17     Chemistry Recent Labs  Lab 09/26/21 1721 09/26/21 2255 09/27/21 0659 09/29/21 0116 09/29/21 0602  NA 134*   < > 136 130* 128*  K 6.9*   < > 4.9 6.7* 6.4*  CL 108   < > 103 101 99  CO2 14*   < > 21* 19* 20*  GLUCOSE 130*   < > 93 107* 82  BUN 44*   < > 36* 44* 44*  CREATININE 4.37*   < > 3.83* 3.52* 3.85*  CALCIUM 9.0   < > 8.3* 7.8* 8.5*  MG 3.4*  --  2.8* 2.7*  --   GFRNONAA 10*   < > 12* 13* 11*  ANIONGAP 12   < > 12 10 9    < > = values in this interval not displayed.    Lipids No results for input(s): CHOL, TRIG, HDL, LABVLDL, LDLCALC, CHOLHDL in the last 168 hours.  Hematology Recent Labs  Lab 09/26/21 1721 09/26/21 2255 09/27/21 0659  WBC 9.2 6.3 8.2  RBC 4.10 3.28* 3.77*  HGB 11.5* 9.0* 10.5*  HCT 36.5 28.9* 31.4*  MCV 89.0 88.1 83.3  MCH 28.0 27.4 27.9  MCHC 31.5 31.1 33.4  RDW 18.1* 18.0* 17.2*  PLT 500* 295 444*   Thyroid No results for input(s): TSH, FREET4 in the last 168 hours.  BNPNo results for input(s): BNP, PROBNP in the last 168 hours.  DDimer No results for input(s): DDIMER in the last 168 hours.   Radiology    No results found.  Cardiac Studies   Echo 09/13/21 IMPRESSIONS     1. Left ventricular ejection fraction, by estimation, is 60 to 65%. The  left ventricle has normal function. The left ventricle has no regional  wall motion abnormalities. There is moderate concentric left ventricular  hypertrophy. Left ventricular  diastolic parameters are consistent with Grade I diastolic dysfunction  (impaired relaxation).   2. Right ventricular systolic function is normal. The right ventricular  size is normal. There is normal pulmonary artery systolic pressure. The  estimated right ventricular systolic pressure is 40.9 mmHg.   3. The mitral valve is grossly normal. Trivial mitral valve  regurgitation. No evidence of  mitral stenosis.   4. The aortic valve is tricuspid. There is mild calcification of the  aortic valve. There is mild thickening of the aortic valve. Aortic valve  regurgitation is trivial. Aortic valve sclerosis is present, with no  evidence of aortic valve stenosis.   5. The inferior vena cava is normal in size with greater than 50%  respiratory variability, suggesting right atrial pressure of 3 mmHg.   Comparison(s): Changes from prior study are noted. EF unchanged. LVH  appears moderate on this study.   FINDINGS   Left Ventricle: Left ventricular ejection fraction, by estimation, is 60  to 65%. The left ventricle has normal function. The left ventricle has no  regional wall motion abnormalities. The left ventricular internal cavity  size was normal in size. There is   moderate concentric left ventricular hypertrophy. Left ventricular  diastolic parameters are consistent with Grade I diastolic dysfunction  (impaired relaxation).   Right Ventricle: The right ventricular size is normal. No increase in  right ventricular wall thickness. Right ventricular systolic function is  normal. There is normal pulmonary artery systolic pressure. The tricuspid  regurgitant velocity is 2.47 m/s, and   with an assumed right atrial pressure of 3 mmHg, the estimated right  ventricular systolic pressure is 81.1 mmHg.   Left Atrium: Left atrial size was normal in size.   Right Atrium: Right atrial size was normal in size.   Pericardium: Trivial pericardial effusion is present.   Mitral Valve: The mitral valve is grossly normal. Trivial mitral valve  regurgitation. No evidence of mitral valve stenosis.   Patient Profile     80 y.o. female with a hx of CKD4, HTN with recent admission for hypertensive emergency, dementia, COPD, PVD admitted with AKI, hyperkalemia and bradycardia.  Recent admit 08/2021 with elevated troponin with hypertensive urgency abnormal EKG and LLL collapse with COPD and active  herpes infection.  .   Assessment & Plan    BRADYCARDIA:  recurrent due to hyperkalemia Beta blockers held plan per nephrology and primary service Had one BM with lokelma  No further pauses.  No indication for pacing.    CAD --Hx of NSTEMI but have been unable to do ischemic work up due to [facility]  F/U Dr Claiborne Billings consider outpatient myove/Tc Pyr scan    HTN:  BP running low  meds held I/O positive    HYPERKALEMIA:  Back up 6.7->6.4 per primary service    AKI:   worsening 3.8 despite positive fluid balance per nephrology   Dementia/malnutrition/anemia/COPD of chronic disease, transfused 1 unit on last admit  Episodic chest pain --hs troponin 17 and 26 though 2 weeks ago 169 to 188  --beta blocker held on ASA/Plavix   For questions or updates, please contact [place] Please consult [email or web address] for contact info under        Signed, Jenkins Rouge, MD  09/29/2021, 9:15 AM

## 2021-09-30 ENCOUNTER — Encounter (HOSPITAL_COMMUNITY): Payer: Self-pay | Admitting: Certified Registered Nurse Anesthetist

## 2021-09-30 DIAGNOSIS — N185 Chronic kidney disease, stage 5: Secondary | ICD-10-CM

## 2021-09-30 DIAGNOSIS — N170 Acute kidney failure with tubular necrosis: Secondary | ICD-10-CM

## 2021-09-30 LAB — CBC WITH DIFFERENTIAL/PLATELET
Abs Immature Granulocytes: 0.04 10*3/uL (ref 0.00–0.07)
Basophils Absolute: 0.1 10*3/uL (ref 0.0–0.1)
Basophils Relative: 1 %
Eosinophils Absolute: 0.4 10*3/uL (ref 0.0–0.5)
Eosinophils Relative: 5 %
HCT: 34.3 % — ABNORMAL LOW (ref 36.0–46.0)
Hemoglobin: 11.4 g/dL — ABNORMAL LOW (ref 12.0–15.0)
Immature Granulocytes: 0 %
Lymphocytes Relative: 32 %
Lymphs Abs: 3 10*3/uL (ref 0.7–4.0)
MCH: 28 pg (ref 26.0–34.0)
MCHC: 33.2 g/dL (ref 30.0–36.0)
MCV: 84.3 fL (ref 80.0–100.0)
Monocytes Absolute: 1 10*3/uL (ref 0.1–1.0)
Monocytes Relative: 10 %
Neutro Abs: 4.9 10*3/uL (ref 1.7–7.7)
Neutrophils Relative %: 52 %
Platelets: 500 10*3/uL — ABNORMAL HIGH (ref 150–400)
RBC: 4.07 MIL/uL (ref 3.87–5.11)
RDW: 17.2 % — ABNORMAL HIGH (ref 11.5–15.5)
WBC: 9.4 10*3/uL (ref 4.0–10.5)
nRBC: 0 % (ref 0.0–0.2)

## 2021-09-30 LAB — BASIC METABOLIC PANEL
Anion gap: 11 (ref 5–15)
BUN: 32 mg/dL — ABNORMAL HIGH (ref 8–23)
CO2: 21 mmol/L — ABNORMAL LOW (ref 22–32)
Calcium: 9.1 mg/dL (ref 8.9–10.3)
Chloride: 100 mmol/L (ref 98–111)
Creatinine, Ser: 3.19 mg/dL — ABNORMAL HIGH (ref 0.44–1.00)
GFR, Estimated: 14 mL/min — ABNORMAL LOW (ref >60)
Glucose, Bld: 97 mg/dL (ref 70–99)
Potassium: 5 mmol/L (ref 3.5–5.1)
Sodium: 132 mmol/L — ABNORMAL LOW (ref 135–145)

## 2021-09-30 LAB — POTASSIUM
Potassium: 5.1 mmol/L (ref 3.5–5.1)
Potassium: 5.9 mmol/L — ABNORMAL HIGH (ref 3.5–5.1)
Potassium: 5 mmol/L (ref 3.5–5.1)
Potassium: 5.3 mmol/L — ABNORMAL HIGH (ref 3.5–5.1)
Potassium: 5 mmol/L (ref 3.5–5.1)

## 2021-09-30 MED ORDER — SODIUM CHLORIDE 0.9 % IV SOLN
1.5000 g | INTRAVENOUS | Status: AC
  Filled 2021-09-30: qty 1.5

## 2021-09-30 NOTE — Progress Notes (Signed)
PROGRESS NOTE  Alyssa [name] PZW:[id number] DOB: May 01, 1941 DOA: 09/26/2021 PCP: System, Provider Not In  Brief History   80 year old female with a history of hypertension, hyperlipidemia, PVD, stage IV CKD, COPD, dementia, who was recently discharged from the hospital on 09/17/2021 after she was treated for hypertensive emergency, came to ED with complaints of 1 week history of increasing fatigue, mild Exertion, lightheadedness.  In the ED she was found to have heart rate of 30, creatinine 4.4 and potassium 6.9.  Patient was admitted under CCM service and cardiology was consulted. She has been transferred to step down status. This morning he received another dose of lokelma. He remains hyperkalemic at 5.5 and bradycardic. She is not symptomatic. Monitor potassium.  The patient states that she is feeling a little better. She has had a BM.  Consultants  Nephrology Cardiology  Procedures  None  Antibiotics   Anti-infectives (From admission, onward)    Start     Dose/Rate Route Frequency Ordered Stop   10/01/21 0600  cefUROXime (ZINACEF) 1.5 g in sodium chloride 0.9 % 100 mL IVPB        1.5 g 200 mL/hr over 30 Minutes Intravenous On call to O.R. 09/30/21 0815 10/02/21 0559      Subjective  The patient is resting comfortably. No new complaints. Bradycardia resolved. Objective   Vitals:  Vitals:   09/30/21 1508 09/30/21 1600  BP: (!) 145/93   Pulse:    Resp: 18   Temp:    SpO2:  100%    Exam:  Constitutional:  The patient is awake, alert, and oriented x 3. No acute distress. Respiratory:  No increased work of breathing. No wheezes, rales, or rhonchi No tactile fremitus Cardiovascular:  Regular rate and rhythm No murmurs, ectopy, or gallups. No lateral PMI. No thrills. Abdomen:  Abdomen is soft, non-tender, non-distended No hernias, masses, or organomegaly Normoactive bowel sounds.  Musculoskeletal:  No cyanosis, clubbing, or edema Skin:  No rashes,  lesions, ulcers palpation of skin: no induration or nodules Neurologic:  CN 2-12 intact Sensation all 4 extremities intact Psychiatric:  Mental status Mood, affect appropriate Orientation to person, place, time  judgment and insight appear intact  I have personally reviewed the following:   Today's Data  Vitals  Lab Data  BMP CBC  Micro Data  MRSA screen negative  Imaging  CXR  Cardiology Data  EKG  Scheduled Meds:  Chlorhexidine Gluconate Cloth  6 each Topical Daily   feeding supplement (NEPRO CARB STEADY)  237 mL Oral BID BM   heparin  5,000 Units Subcutaneous Q8H   lidocaine  1 patch Transdermal Q24H   multivitamin  1 tablet Oral QHS   pantoprazole  40 mg Oral QHS   polyethylene glycol  17 g Oral BID   pregabalin  75 mg Oral Daily   Continuous Infusions:  sodium chloride     [START ON 10/01/2021] cefUROXime (ZINACEF)  IV     norepinephrine (LEVOPHED) Adult infusion Stopped (09/29/21 1045)    Problem  Complications Due to Renal Dialysis Device, Implant, and Graft      LOS: 4 days   A & P  Acute kidney injury on CKD stage IV -Patient baseline creatinine is around 2.75-2.85 -Presented with creatinine of 4.4 -Today creatinine has improved to 3.63 -Nephrology has been consulted.   Hyperkalemia -Patient presented with a potassium of 6.9, back up to 6.4 on 09/29/2021. She has received Lokelma x 2, sodium bicarb, albuterol, calcium gluconate. 5.3 Currently. Continue to  monitor and treat.   Left upper quadrant pain -Patient had extensive work-up during previous admission at that time CT abdomen pelvis did not show significant abnormality -Patient is constipated -We will give Dulcolax suppository 10 mg x 1 -Also start MiraLAX 17 g p.o. twice daily -BM x 2 on 09/28/2021 BM on 09/30/2021. Discomfort relieved.   Bradycardia -Resolved.  -Ongoing as is hypokalemia. Last dose of Lokelma at 0400 am. Potassium is currently 5.5. -Likely due to medications and  hyperkalemia -Coreg is currently on hold -Cardiology considering PYP scan to exclude ATTR cardiac amyloid as outpatient   Hypertension -Blood pressure is high today. -Coreg on hold due to bradycardia -Start amlodipine. -We will monitor patient's blood pressure closely   Pseudoaneurysm versus hematoma -Incidental finding on CT abdomen/pelvis during previous admission -CT scan showed 5.5 x 4.8 cm lesion in the right groin apparently incorporating the graft anastomosis -Imaging suggest hematoma versus pseudoaneurysm -Patient was seen by vascular surgery in September 2022 for the same and was supposed to follow-up with vascular surgery as outpatient for repair of pseudoaneurysm -Vascular surgical follow-up in the outpatient setting.     I have seen and examined this patient myself. I have spent 34 minutes in her evaluation and care.  DVT prophylaxis: Heparin Code Status: DNR Family Communication: None available Disposition Plan: Home    Myrene Bougher, DO [facility] Direct contact: see [email or web address]  7PM-7AM contact night coverage as above 09/30/2021, 5:23 PM  LOS: 3 days

## 2021-09-30 NOTE — Progress Notes (Addendum)
Progress Note  [name] Date of Encounter: 09/30/2021  [facility] Cardiologist: Shelva Majestic, MD   Subjective    No cardiac complaints   Inpatient Medications    Scheduled Meds:  Chlorhexidine Gluconate Cloth  6 each Topical Daily   feeding supplement (NEPRO CARB STEADY)  237 mL Oral BID BM   heparin  5,000 Units Subcutaneous Q8H   lidocaine  1 patch Transdermal Q24H   multivitamin  1 tablet Oral QHS   pantoprazole  40 mg Oral QHS   polyethylene glycol  17 g Oral BID   pregabalin  75 mg Oral Daily   Continuous Infusions:  sodium chloride     [START ON 10/01/2021] cefUROXime (ZINACEF)  IV     norepinephrine (LEVOPHED) Adult infusion Stopped (09/29/21 1045)   PRN Meds: acetaminophen, docusate sodium, ondansetron (ZOFRAN) IV, oxyCODONE   Vital Signs    Vitals:   09/30/21 0500 09/30/21 0700 09/30/21 0722 09/30/21 0800  BP: (!) 158/100  (!) 149/95 (!) 160/93  Pulse:      Resp: 15  12 14   Temp:  97.6 F (36.4 C)    TempSrc:  Oral    SpO2:    98%  Weight:        Intake/Output Summary (Last 24 hours) at 09/30/2021 0950 Last data filed at 09/30/2021 0800 Gross per 24 hour  Intake 583.69 ml  Output 700 ml  Net -116.31 ml   Last 3 Weights 09/29/2021 09/10/2021 08/07/2021  Weight (lbs) 106 lb 7.7 oz 95 lb 0.3 oz 95 lb  Weight (kg) 48.3 kg 43.1 kg 43.092 kg      Telemetry    SR rates 70 ;s   ECG     No new - Personally Reviewed  Physical Exam   GEN: No acute distress.   Neck: No JVD Cardiac: RRR, no murmurs, rubs, or gallops.  Respiratory: Clear to auscultation bilaterally. 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   Labs    High Sensitivity Troponin:   Recent Labs  Lab 09/10/21 1446 09/10/21 1635 09/26/21 1721 09/26/21 2255  TROPONINIHS 188* 169* 26* 17     Chemistry Recent Labs  Lab 09/26/21 1721 09/26/21 2255 09/27/21 0659 09/29/21 0116 09/29/21 0602  09/29/21 1106 09/29/21 1443 09/29/21 1847 09/30/21 0048 09/30/21 0704  NA 134*   < > 136 130* 128* 131*  --   --   --  132*  K 6.9*   < > 4.9 6.7* 6.4* 5.5*   < > 4.9 5.1 5.9*   5.0  CL 108   < > 103 101 99 100  --   --   --  100  CO2 14*   < > 21* 19* 20* 20*  --   --   --  21*  GLUCOSE 130*   < > 93 107* 82 117*  --   --   --  97  BUN 44*   < > 36* 44* 44* 43*  --   --   --  32*  CREATININE 4.37*   < > 3.83* 3.52* 3.85* 3.63*  --   --   --  3.19*  CALCIUM 9.0   < > 8.3* 7.8* 8.5* 9.0  --   --   --  9.1  MG 3.4*  --  2.8* 2.7*  --   --   --   --   --   --   GFRNONAA 10*   < >  12* 13* 11* 12*  --   --   --  14*  ANIONGAP 12   < > 12 10 9 11   --   --   --  11   < > = values in this interval not displayed.    Lipids No results for input(s): CHOL, TRIG, HDL, LABVLDL, LDLCALC, CHOLHDL in the last 168 hours.  Hematology Recent Labs  Lab 09/26/21 2255 09/27/21 0659 09/30/21 0704  WBC 6.3 8.2 9.4  RBC 3.28* 3.77* 4.07  HGB 9.0* 10.5* 11.4*  HCT 28.9* 31.4* 34.3*  MCV 88.1 83.3 84.3  MCH 27.4 27.9 28.0  MCHC 31.1 33.4 33.2  RDW 18.0* 17.2* 17.2*  PLT 295 444* 500*   Thyroid No results for input(s): TSH, FREET4 in the last 168 hours.  BNPNo results for input(s): BNP, PROBNP in the last 168 hours.  DDimer No results for input(s): DDIMER in the last 168 hours.   Radiology    No results found.  Cardiac Studies   Echo 09/13/21 IMPRESSIONS     1. Left ventricular ejection fraction, by estimation, is 60 to 65%. The  left ventricle has normal function. The left ventricle has no regional  wall motion abnormalities. There is moderate concentric left ventricular  hypertrophy. Left ventricular  diastolic parameters are consistent with Grade I diastolic dysfunction  (impaired relaxation).   2. Right ventricular systolic function is normal. The right ventricular  size is normal. There is normal pulmonary artery systolic pressure. The  estimated right ventricular systolic pressure  is 60.1 mmHg.   3. The mitral valve is grossly normal. Trivial mitral valve  regurgitation. No evidence of mitral stenosis.   4. The aortic valve is tricuspid. There is mild calcification of the  aortic valve. There is mild thickening of the aortic valve. Aortic valve  regurgitation is trivial. Aortic valve sclerosis is present, with no  evidence of aortic valve stenosis.   5. The inferior vena cava is normal in size with greater than 50%  respiratory variability, suggesting right atrial pressure of 3 mmHg.   Comparison(s): Changes from prior study are noted. EF unchanged. LVH  appears moderate on this study.   FINDINGS   Left Ventricle: Left ventricular ejection fraction, by estimation, is 60  to 65%. The left ventricle has normal function. The left ventricle has no  regional wall motion abnormalities. The left ventricular internal cavity  size was normal in size. There is   moderate concentric left ventricular hypertrophy. Left ventricular  diastolic parameters are consistent with Grade I diastolic dysfunction  (impaired relaxation).   Right Ventricle: The right ventricular size is normal. No increase in  right ventricular wall thickness. Right ventricular systolic function is  normal. There is normal pulmonary artery systolic pressure. The tricuspid  regurgitant velocity is 2.47 m/s, and   with an assumed right atrial pressure of 3 mmHg, the estimated right  ventricular systolic pressure is 09.3 mmHg.   Left Atrium: Left atrial size was normal in size.   Right Atrium: Right atrial size was normal in size.   Pericardium: Trivial pericardial effusion is present.   Mitral Valve: The mitral valve is grossly normal. Trivial mitral valve  regurgitation. No evidence of mitral valve stenosis.   Patient Profile     80 y.o. female with a hx of CKD4, HTN with recent admission for hypertensive emergency, dementia, COPD, PVD admitted with AKI, hyperkalemia and bradycardia.  Recent admit  08/2021 with elevated troponin with hypertensive urgency abnormal EKG and  LLL collapse with COPD and active herpes infection.  .   Assessment & Plan    BRADYCARDIA:  recurrent due to hyperkalemia Beta blockers held plan per nephrology and primary service Rx Lokelma , HcO3 insulin and glucose improved and HR back up   CAD --Hx of NSTEMI but have been unable to do ischemic work up due to [facility]  F/U Dr Claiborne Billings consider outpatient myove/Tc Pyr scan    HTN:  BP running low meds held I/O positive    HYPERKALEMIA:  K 5.0 this am Cr 3.19 per nephrology     AKI:   3.19   per nephrology   Dementia/malnutrition/anemia/COPD of chronic disease, transfused 1 unit on last admit  Episodic chest pain --hs troponin 17 and 26 though 2 weeks ago 169 to 188  --beta blocker held on ASA/Plavix   Vascular:  chronic RFA pseudo-aneurysm ? Elective repair in a few weeks per /Dr Trula Slade [facility]   For questions or updates, please contact [place] Please consult [email or web address] for contact info under        Signed, Jenkins Rouge, MD  09/30/2021, 9:50 AM

## 2021-09-30 NOTE — Progress Notes (Signed)
[facility]  Subjective  - No new complaints   Objective (!) 158/100 81 98.6 F (37 C) (Oral) 15 97%  Intake/Output Summary (Last 24 hours) at 09/30/2021 0809 Last data filed at 09/30/2021 0000 Gross per 24 hour  Intake 664.22 ml  Output 300 ml  Net 364.22 ml    Right groin firm to palpation pseudoaneurysm without ischemic skin changes feet warm and well perfused without ischemic changes.  Lungs non labored breathing Heart Tachy with hypertension systolic 921'J  Assessment/Planning: PAD with history of Fem-Fem bypass by Dr. Oneida Alar 2011 Lesion in the right groin apparently incorporating the graft anastomosis measures 5.5 x 4.8 cm today compared to 4.8 x 4.1 cm previously.  Plan for Surgery tomorrow with Dr. Trula Slade Repair right Pseudoaneurysm. NPO past MN   Alyssa [name] 09/30/2021 8:09 AM --  Laboratory Lab Results: Recent Labs    09/30/21 0704  WBC 9.4  HGB 11.4*  HCT 34.3*  PLT 500*   BMET Recent Labs    09/29/21 1106 09/29/21 1443 09/30/21 0048 09/30/21 0704  NA 131*  --   --  132*  K 5.5*   < > 5.1 5.9*   5.0  CL 100  --   --  100  CO2 20*  --   --  21*  GLUCOSE 117*  --   --  97  BUN 43*  --   --  32*  CREATININE 3.63*  --   --  3.19*  CALCIUM 9.0  --   --  9.1   < > = values in this interval not displayed.    COAG Lab Results  Component Value Date   INR 1.2 07/06/2020   INR 2.2 (H) 07/06/2020   INR 1.0 11/01/2019   No results found for: PTT

## 2021-10-01 ENCOUNTER — Inpatient Hospital Stay (HOSPITAL_COMMUNITY): Admission: RE | Admit: 2021-10-01 | Payer: Medicare HMO | Source: Home / Self Care | Admitting: Surgery

## 2021-10-01 ENCOUNTER — Encounter (HOSPITAL_COMMUNITY): Admission: RE | Payer: Self-pay | Source: Home / Self Care

## 2021-10-01 LAB — BASIC METABOLIC PANEL
Anion gap: 12 (ref 5–15)
BUN: 34 mg/dL — ABNORMAL HIGH (ref 8–23)
CO2: 19 mmol/L — ABNORMAL LOW (ref 22–32)
Calcium: 9.3 mg/dL (ref 8.9–10.3)
Chloride: 103 mmol/L (ref 98–111)
Creatinine, Ser: 2.98 mg/dL — ABNORMAL HIGH (ref 0.44–1.00)
GFR, Estimated: 16 mL/min — ABNORMAL LOW (ref >60)
Glucose, Bld: 98 mg/dL (ref 70–99)
Potassium: 5.5 mmol/L — ABNORMAL HIGH (ref 3.5–5.1)
Sodium: 134 mmol/L — ABNORMAL LOW (ref 135–145)

## 2021-10-01 LAB — CBC
HCT: 34 % — ABNORMAL LOW (ref 36.0–46.0)
Hemoglobin: 11.1 g/dL — ABNORMAL LOW (ref 12.0–15.0)
MCH: 27.8 pg (ref 26.0–34.0)
MCHC: 32.6 g/dL (ref 30.0–36.0)
MCV: 85 fL (ref 80.0–100.0)
Platelets: 443 10*3/uL — ABNORMAL HIGH (ref 150–400)
RBC: 4 MIL/uL (ref 3.87–5.11)
RDW: 17.3 % — ABNORMAL HIGH (ref 11.5–15.5)
WBC: 8.5 10*3/uL (ref 4.0–10.5)
nRBC: 0 % (ref 0.0–0.2)

## 2021-10-01 LAB — POTASSIUM
Potassium: 5.7 mmol/L — ABNORMAL HIGH (ref 3.5–5.1)
Potassium: 5.1 mmol/L (ref 3.5–5.1)
Potassium: 5.1 mmol/L (ref 3.5–5.1)

## 2021-10-01 SURGERY — REPAIR, PSEUDOANEURYSM
Anesthesia: General | Laterality: Right

## 2021-10-01 MED ORDER — SODIUM ZIRCONIUM CYCLOSILICATE 10 G PO PACK
10.0000 g | PACK | Freq: Once | ORAL | Status: AC
  Administered 2021-10-02: 10 g via ORAL
  Filled 2021-10-01: qty 1

## 2021-10-01 MED ORDER — HYDRALAZINE HCL 25 MG PO TABS
25.0000 mg | ORAL_TABLET | Freq: Three times a day (TID) | ORAL | Status: DC
  Administered 2021-10-01 – 2021-10-02 (×2): 25 mg via ORAL
  Filled 2021-10-01 (×2): qty 1

## 2021-10-01 MED ORDER — SODIUM ZIRCONIUM CYCLOSILICATE 10 G PO PACK
10.0000 g | PACK | Freq: Once | ORAL | Status: AC
  Administered 2021-10-01: 10 g via ORAL
  Filled 2021-10-01: qty 1

## 2021-10-01 MED ORDER — OXYCODONE HCL 5 MG PO TABS
5.0000 mg | ORAL_TABLET | Freq: Four times a day (QID) | ORAL | Status: AC | PRN
  Administered 2021-10-01 – 2021-10-02 (×2): 5 mg via ORAL
  Filled 2021-10-01 (×2): qty 1

## 2021-10-01 MED ORDER — HYDRALAZINE HCL 25 MG PO TABS
25.0000 mg | ORAL_TABLET | Freq: Four times a day (QID) | ORAL | Status: DC | PRN
  Administered 2021-10-01 – 2021-10-02 (×3): 25 mg via ORAL
  Filled 2021-10-01 (×3): qty 1

## 2021-10-01 NOTE — Progress Notes (Signed)
PROGRESS NOTE  Alyssa [name] Alyssa [name]:[id number] DOB: [birth year] DOA: 09/26/2021 PCP: Alyssa [name], Provider Not In  Brief History   80 year old female with a history of hypertension, hyperlipidemia, PVD, stage IV CKD, COPD, dementia, who was recently discharged from the hospital on 09/17/2021 after she was treated for hypertensive emergency, came to ED with complaints of 1 week history of increasing fatigue, mild Exertion, lightheadedness.  In the ED she was found to have heart rate of 30, creatinine 4.4 and potassium 6.9.  Patient was admitted under CCM service and cardiology was consulted. She has been transferred to step down status. This morning he received another dose of lokelma. He remains hyperkalemic at 5.5 and bradycardic. She is not symptomatic. Monitor potassium.  The patient states that she is feeling a little better. She has had a BM. However, her potassium continues to be elevated despite lokelma, insulin, and glucose.  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   Hydralazine has been started to address hypertension.  Consultants  Nephrology Cardiology  Procedures  None  Antibiotics   Anti-infectives (From admission, onward)    Start     Dose/Rate Route Frequency Ordered Stop   10/01/21 0600  cefUROXime (ZINACEF) 1.5 g in sodium chloride 0.9 % 100 mL IVPB        1.5 g 200 mL/hr over 30 Minutes Intravenous On call to O.R. 09/30/21 0815 10/02/21 0559      Subjective  The patient is resting comfortably. No new complaints. Bradycardia resolved. Objective   Vitals:  Vitals:   10/01/21 1700 10/01/21 1744  BP: (!) 161/96 (!) 143/91  Pulse: 84   Resp: 14   Temp:    SpO2: 99%     Exam:  Constitutional:  The patient is awake, alert, and oriented x 3. No acute distress. Respiratory:  No increased work of breathing. No wheezes, rales, or rhonchi No tactile fremitus Cardiovascular:  Regular rate and rhythm No  murmurs, ectopy, or gallups. No lateral PMI. No thrills. Abdomen:  Abdomen is soft, non-tender, non-distended No hernias, masses, or organomegaly Normoactive bowel sounds.  Musculoskeletal:  No cyanosis, clubbing, or edema Skin:  No rashes, lesions, ulcers palpation of skin: no induration or nodules Neurologic:  CN 2-12 intact Sensation all 4 extremities intact Psychiatric:  Mental status Mood, affect appropriate Orientation to person, place, time  judgment and insight appear intact  I have personally reviewed the following:   Today's Data  Vitals  Lab Data  BMP CBC  Micro Data  MRSA screen negative  Imaging  CXR  Cardiology Data  EKG  Scheduled Meds:  Chlorhexidine Gluconate Cloth  6 each Topical Daily   feeding supplement (NEPRO CARB STEADY)  237 mL Oral BID BM   heparin  5,000 Units Subcutaneous Q8H   lidocaine  1 patch Transdermal Q24H   multivitamin  1 tablet Oral QHS   pantoprazole  40 mg Oral QHS   polyethylene glycol  17 g Oral BID   pregabalin  75 mg Oral Daily   [START ON 10/02/2021] sodium zirconium cyclosilicate  10 g Oral Once   Continuous Infusions:  sodium chloride     cefUROXime (ZINACEF)  IV      Problem  Complications Due to Renal Dialysis Device, Implant, and Graft      LOS: 5 days   A & P  Acute kidney injury on CKD stage IV -Patient baseline creatinine is around 2.75-2.85 -Presented with creatinine of 4.4 -Today creatinine has improved  to 2.98. -Nephrology has been consulted.   Hyperkalemia -Patient presented with a potassium of 6.9, back up to 6.4 on 09/29/2021. She has received Lokelma x 2, sodium bicarb, albuterol, calcium gluconate. 5.1currently. Continue to monitor and treat. Patient is no longer bradycardic.   Left upper quadrant pain -Patient had extensive work-up during previous admission at that time CT abdomen pelvis did not show significant abnormality -Patient is constipated -We will give Dulcolax suppository 10  mg x 1 -Also start MiraLAX 17 g p.o. twice daily -BM x 2 on 09/28/2021 BM on 09/30/2021. Discomfort relieved.   Bradycardia -Resolved.  -Ongoing as is hypokalemia. Last dose of Lokelma at 0400 am. Potassium is currently 5.5. -Likely due to medications and hyperkalemia -Coreg is currently on hold -Cardiology considering PYP scan to exclude ATTR cardiac amyloid as outpatient   Hypertension -Blood pressure is high today. -Coreg on hold due to bradycardia -Hydralazine has been added to amlodipine. -We will monitor patient's blood pressure closely   Pseudoaneurysm versus hematoma -Incidental finding on CT abdomen/pelvis during previous admission -CT scan showed 5.5 x 4.8 cm lesion in the right groin apparently incorporating the graft anastomosis -Imaging suggest hematoma versus pseudoaneurysm -Patient was seen by vascular surgery in September 2022 for the same and was supposed to follow-up with vascular surgery as outpatient for repair of pseudoaneurysm -Vascular surgical follow-up in the outpatient setting.   I have seen and examined this patient myself. I have spent 34 minutes in her evaluation and care.  DVT prophylaxis: Heparin Code Status: DNR Family Communication: None available Disposition Plan: Home    [name], DO [facility] Direct contact: see [email or web address]  7PM-7AM contact night coverage as above 10/01/2021, 6:16 PM  LOS: 3 days

## 2021-10-01 NOTE — Progress Notes (Signed)
Right femoral pseudoaneurysm repair canceled today due to underlying acute medical issues.  I will try to get this scheduled once she has recovered and is a good surgical candidate.  Annamarie Major

## 2021-10-01 NOTE — Progress Notes (Signed)
Progress Note  [name] Date of Encounter: 10/01/2021  [facility] Cardiologist: Shelva Majestic, MD   Subjective   No cardiac complaints   Inpatient Medications    Scheduled Meds:  Chlorhexidine Gluconate Cloth  6 each Topical Daily   feeding supplement (NEPRO CARB STEADY)  237 mL Oral BID BM   heparin  5,000 Units Subcutaneous Q8H   lidocaine  1 patch Transdermal Q24H   multivitamin  1 tablet Oral QHS   pantoprazole  40 mg Oral QHS   polyethylene glycol  17 g Oral BID   pregabalin  75 mg Oral Daily   Continuous Infusions:  sodium chloride     cefUROXime (ZINACEF)  IV     norepinephrine (LEVOPHED) Adult infusion Stopped (09/29/21 1045)   PRN Meds: acetaminophen, docusate sodium, ondansetron (ZOFRAN) IV, oxyCODONE   Vital Signs    Vitals:   10/01/21 0800 10/01/21 0825 10/01/21 0900 10/01/21 1000  BP: (!) 135/94  (!) 152/95 (!) 159/96  Pulse: 84  84 78  Resp: 12  13 11   Temp: 97.8 F (36.6 C) 97.6 F (36.4 C)    TempSrc: Oral Oral    SpO2: 98%  97% 98%  Weight:        Intake/Output Summary (Last 24 hours) at 10/01/2021 1101 Last data filed at 10/01/2021 0943 Gross per 24 hour  Intake 600 ml  Output 850 ml  Net -250 ml   Last 3 Weights 10/01/2021 09/29/2021 09/10/2021  Weight (lbs) 91 lb 11.4 oz 106 lb 7.7 oz 95 lb 0.3 oz  Weight (kg) 41.6 kg 48.3 kg 43.1 kg      Telemetry    NSR   ECG    SB rate 40 09/29/21   Physical Exam   GEN: No acute distress.   Neck: No JVD Cardiac: RRR, no murmurs, rubs, or gallops.  Respiratory: Clear to auscultation bilaterally. 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   Labs    High Sensitivity Troponin:   Recent Labs  Lab 09/10/21 1446 09/10/21 1635 09/26/21 1721 09/26/21 2255  TROPONINIHS 188* 169* 26* 17     Chemistry Recent Labs  Lab 09/26/21 1721 09/26/21 2255 09/27/21 0659 09/29/21 0116 09/29/21 0602 09/29/21 1106  09/29/21 1443 09/30/21 0704 09/30/21 1108 09/30/21 2104 10/01/21 0300 10/01/21 0632  NA 134*   < > 136 130*   < > 131*  --  132*  --   --   --  134*  K 6.9*   < > 4.9 6.7*   < > 5.5*   < > 5.9*   5.0   < > 5.0 5.7* 5.5*  CL 108   < > 103 101   < > 100  --  100  --   --   --  103  CO2 14*   < > 21* 19*   < > 20*  --  21*  --   --   --  19*  GLUCOSE 130*   < > 93 107*   < > 117*  --  97  --   --   --  98  BUN 44*   < > 36* 44*   < > 43*  --  32*  --   --   --  34*  CREATININE 4.37*   < > 3.83* 3.52*   < > 3.63*  --  3.19*  --   --   --  2.98*  CALCIUM 9.0   < > 8.3* 7.8*   < > 9.0  --  9.1  --   --   --  9.3  MG 3.4*  --  2.8* 2.7*  --   --   --   --   --   --   --   --   GFRNONAA 10*   < > 12* 13*   < > 12*  --  14*  --   --   --  16*  ANIONGAP 12   < > 12 10   < > 11  --  11  --   --   --  12   < > = values in this interval not displayed.    Lipids No results for input(s): CHOL, TRIG, HDL, LABVLDL, LDLCALC, CHOLHDL in the last 168 hours.  Hematology Recent Labs  Lab 09/27/21 0659 09/30/21 0704 10/01/21 0632  WBC 8.2 9.4 8.5  RBC 3.77* 4.07 4.00  HGB 10.5* 11.4* 11.1*  HCT 31.4* 34.3* 34.0*  MCV 83.3 84.3 85.0  MCH 27.9 28.0 27.8  MCHC 33.4 33.2 32.6  RDW 17.2* 17.2* 17.3*  PLT 444* 500* 443*   Thyroid No results for input(s): TSH, FREET4 in the last 168 hours.  BNPNo results for input(s): BNP, PROBNP in the last 168 hours.  DDimer No results for input(s): DDIMER in the last 168 hours.   Radiology    No results found.  Cardiac Studies   Echo 09/13/21 IMPRESSIONS     1. Left ventricular ejection fraction, by estimation, is 60 to 65%. The  left ventricle has normal function. The left ventricle has no regional  wall motion abnormalities. There is moderate concentric left ventricular  hypertrophy. Left ventricular  diastolic parameters are consistent with Grade I diastolic dysfunction  (impaired relaxation).   2. Right ventricular systolic function is normal. The  right ventricular  size is normal. There is normal pulmonary artery systolic pressure. The  estimated right ventricular systolic pressure is 54.2 mmHg.   3. The mitral valve is grossly normal. Trivial mitral valve  regurgitation. No evidence of mitral stenosis.   4. The aortic valve is tricuspid. There is mild calcification of the  aortic valve. There is mild thickening of the aortic valve. Aortic valve  regurgitation is trivial. Aortic valve sclerosis is present, with no  evidence of aortic valve stenosis.   5. The inferior vena cava is normal in size with greater than 50%  respiratory variability, suggesting right atrial pressure of 3 mmHg.   Comparison(s): Changes from prior study are noted. EF unchanged. LVH  appears moderate on this study.   FINDINGS   Left Ventricle: Left ventricular ejection fraction, by estimation, is 60  to 65%. The left ventricle has normal function. The left ventricle has no  regional wall motion abnormalities. The left ventricular internal cavity  size was normal in size. There is   moderate concentric left ventricular hypertrophy. Left ventricular  diastolic parameters are consistent with Grade I diastolic dysfunction  (impaired relaxation).   Right Ventricle: The right ventricular size is normal. No increase in  right ventricular wall thickness. Right ventricular systolic function is  normal. There is normal pulmonary artery systolic pressure. The tricuspid  regurgitant velocity is 2.47 m/s, and   with an assumed right atrial pressure of 3 mmHg, the estimated right  ventricular systolic pressure is 70.6 mmHg.   Left Atrium: Left atrial size was normal in size.   Right Atrium:  Right atrial size was normal in size.   Pericardium: Trivial pericardial effusion is present.   Mitral Valve: The mitral valve is grossly normal. Trivial mitral valve  regurgitation. No evidence of mitral valve stenosis.   Patient Profile     80 y.o. female with a hx of  CKD4, HTN with recent admission for hypertensive emergency, dementia, COPD, PVD admitted with AKI, hyperkalemia and bradycardia.  Recent admit 08/2021 with elevated troponin with hypertensive urgency abnormal EKG and LLL collapse with COPD and active herpes infection.  .   Assessment & Plan    BRADYCARDIA:  recurrent due to hyperkalemia Beta blockers held plan per nephrology and primary service Rx Lokelma , HcO3 insulin and glucose improved and HR back up   CAD --Hx of NSTEMI but have been unable to do ischemic work up due to [facility]  F/U Dr Claiborne Billings consider outpatient myove/Tc Pyr scan    HTN:  BP running low meds held I/O positive    HYPERKALEMIA:  K 5.5 this am Cr 2.98 per nephrology  I wonder if she should go home on lokelma given her recurrent azotemia and consistent bradycardia with hyperkalemia    AKI:   2.98   per nephrology   Dementia/malnutrition/anemia/COPD of chronic disease, transfused 1 unit on last admit  Episodic chest pain --hs troponin 17 and 26 though 2 weeks ago 169 to 188  --beta blocker held on ASA/Plavix   Vascular:  chronic RFA pseudo-aneurysm ? Elective repair in a few weeks per /Dr Trula Slade [facility]   For questions or updates, please contact [place] Please consult [email or web address] for contact info under        Signed, Jenkins Rouge, MD  10/01/2021, 11:01 AM

## 2021-10-02 DIAGNOSIS — T82898A Other specified complication of vascular prosthetic devices, implants and grafts, initial encounter: Secondary | ICD-10-CM

## 2021-10-02 LAB — FERRITIN: Ferritin: 625 ng/mL — ABNORMAL HIGH (ref 11–307)

## 2021-10-02 LAB — RETICULOCYTES
Immature Retic Fract: 20.8 % — ABNORMAL HIGH (ref 2.3–15.9)
RBC.: 3.62 MIL/uL — ABNORMAL LOW (ref 3.87–5.11)
Retic Count, Absolute: 52.9 10*3/uL (ref 19.0–186.0)
Retic Ct Pct: 1.5 % (ref 0.4–3.1)

## 2021-10-02 LAB — CBC
HCT: 28.7 % — ABNORMAL LOW (ref 36.0–46.0)
Hemoglobin: 9.3 g/dL — ABNORMAL LOW (ref 12.0–15.0)
MCH: 28 pg (ref 26.0–34.0)
MCHC: 32.4 g/dL (ref 30.0–36.0)
MCV: 86.4 fL (ref 80.0–100.0)
Platelets: 437 10*3/uL — ABNORMAL HIGH (ref 150–400)
RBC: 3.32 MIL/uL — ABNORMAL LOW (ref 3.87–5.11)
RDW: 17.7 % — ABNORMAL HIGH (ref 11.5–15.5)
WBC: 8.5 10*3/uL (ref 4.0–10.5)
nRBC: 0 % (ref 0.0–0.2)

## 2021-10-02 LAB — FOLATE: Folate: 90.3 ng/mL (ref >5.9)

## 2021-10-02 LAB — IRON AND TIBC
Iron: 80 ug/dL (ref 28–170)
Saturation Ratios: 29 % (ref 10.4–31.8)
TIBC: 272 ug/dL (ref 250–450)
UIBC: 192 ug/dL

## 2021-10-02 LAB — POTASSIUM
Potassium: 4.9 mmol/L (ref 3.5–5.1)
Potassium: 4.5 mmol/L (ref 3.5–5.1)
Potassium: 5.1 mmol/L (ref 3.5–5.1)

## 2021-10-02 LAB — BASIC METABOLIC PANEL
Anion gap: 9 (ref 5–15)
BUN: 39 mg/dL — ABNORMAL HIGH (ref 8–23)
CO2: 20 mmol/L — ABNORMAL LOW (ref 22–32)
Calcium: 9 mg/dL (ref 8.9–10.3)
Chloride: 102 mmol/L (ref 98–111)
Creatinine, Ser: 3.14 mg/dL — ABNORMAL HIGH (ref 0.44–1.00)
GFR, Estimated: 15 mL/min — ABNORMAL LOW (ref >60)
Glucose, Bld: 148 mg/dL — ABNORMAL HIGH (ref 70–99)
Potassium: 5.1 mmol/L (ref 3.5–5.1)
Sodium: 131 mmol/L — ABNORMAL LOW (ref 135–145)

## 2021-10-02 LAB — VITAMIN B12: Vitamin B-12: 751 pg/mL (ref 180–914)

## 2021-10-02 MED ORDER — DICLOFENAC SODIUM 1 % EX GEL
2.0000 g | Freq: Four times a day (QID) | CUTANEOUS | Status: DC | PRN
  Filled 2021-10-02: qty 100

## 2021-10-02 MED ORDER — AMLODIPINE BESYLATE 10 MG PO TABS
10.0000 mg | ORAL_TABLET | Freq: Every day | ORAL | Status: DC
  Administered 2021-10-02 – 2021-10-09 (×8): 10 mg via ORAL
  Filled 2021-10-02 (×8): qty 1

## 2021-10-02 MED ORDER — HYDRALAZINE HCL 50 MG PO TABS
50.0000 mg | ORAL_TABLET | Freq: Three times a day (TID) | ORAL | Status: DC
  Administered 2021-10-02 – 2021-10-09 (×22): 50 mg via ORAL
  Filled 2021-10-02 (×22): qty 1

## 2021-10-02 MED ORDER — OXYCODONE HCL 5 MG PO TABS
5.0000 mg | ORAL_TABLET | Freq: Four times a day (QID) | ORAL | Status: AC | PRN
  Administered 2021-10-02 (×2): 5 mg via ORAL
  Filled 2021-10-02 (×2): qty 1

## 2021-10-02 NOTE — Progress Notes (Signed)
PROGRESS NOTE  Alyssa [name] HYW:[id number] DOB: [birth year] DOA: 09/26/2021 PCP: System, Provider Not In  Brief History   80 year old female with a history of hypertension, hyperlipidemia, PVD, stage IV CKD, COPD, dementia, who was recently discharged from the hospital on 09/17/2021 after she was treated for hypertensive emergency, came to ED with complaints of 1 week history of increasing fatigue, mild Exertion, lightheadedness.  In the ED she was found to have heart rate of 30, creatinine 4.4 and potassium 6.9.  Patient was admitted under CCM service and cardiology was consulted. She has been transferred to step down status. This morning he received another dose of lokelma. He remains hyperkalemic at 5.5 and bradycardic. She is not symptomatic. Monitor potassium.  The patient states that she is feeling a little better. She has had a BM. However, her potassium continues to be elevated despite lokelma, insulin, and glucose.  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   Hydralazine has been started to address hypertension.  Consultants  Nephrology Cardiology  Procedures  None  Antibiotics   Anti-infectives (From admission, onward)    Start     Dose/Rate Route Frequency Ordered Stop   10/01/21 0600  cefUROXime (ZINACEF) 1.5 g in sodium chloride 0.9 % 100 mL IVPB        1.5 g 200 mL/hr over 30 Minutes Intravenous On call to O.R. 09/30/21 0815 10/02/21 0559      Subjective  The patient is resting comfortably. No new complaints. Bradycardia resolved. Objective   Vitals:  Vitals:   10/02/21 1300 10/02/21 1321  BP: (!) 150/91 (!) 150/91  Pulse: (!) 102   Resp: 15   Temp:    SpO2: 97%     Exam:  Constitutional:  The patient is awake, alert, and oriented x 3. No acute distress. Respiratory:  No increased work of breathing. No wheezes, rales, or rhonchi No tactile fremitus Cardiovascular:  Regular rate and rhythm No  murmurs, ectopy, or gallups. No lateral PMI. No thrills. Abdomen:  Abdomen is soft, non-tender, non-distended No hernias, masses, or organomegaly Normoactive bowel sounds.  Musculoskeletal:  No cyanosis, clubbing, or edema Skin:  No rashes, lesions, ulcers palpation of skin: no induration or nodules Neurologic:  CN 2-12 intact Sensation all 4 extremities intact Psychiatric:  Mental status Mood, affect appropriate Orientation to person, place, time  judgment and insight appear intact  I have personally reviewed the following:   Today's Data  Vitals  Lab Data  BMP CBC  Micro Data  MRSA screen negative  Imaging  CXR  Cardiology Data  EKG  Scheduled Meds:  amLODipine  10 mg Oral Daily   Chlorhexidine Gluconate Cloth  6 each Topical Daily   feeding supplement (NEPRO CARB STEADY)  237 mL Oral BID BM   heparin  5,000 Units Subcutaneous Q8H   hydrALAZINE  50 mg Oral Q8H   lidocaine  1 patch Transdermal Q24H   multivitamin  1 tablet Oral QHS   pantoprazole  40 mg Oral QHS   polyethylene glycol  17 g Oral BID   pregabalin  75 mg Oral Daily   Continuous Infusions:  sodium chloride      Problem  Complications Due to Renal Dialysis Device, Implant, and Graft      LOS: 6 days   A & P  Acute kidney injury on CKD stage IV -Patient baseline creatinine is around 2.75-2.85 -Presented with creatinine of 4.4 -Today creatinine has improved to 2.98. -Nephrology has  been consulted.   Hyperkalemia -Patient presented with a potassium of 6.9, back up to 6.4 on 09/29/2021. She has received Lokelma x 2, sodium bicarb, albuterol, calcium gluconate. 54.5 currently. Continue to monitor and treat. Patient is no longer bradycardic.   Left upper quadrant pain -Patient had extensive work-up during previous admission at that time CT abdomen pelvis did not show significant abnormality -Patient is constipated -We will give Dulcolax suppository 10 mg x 1 -Also start MiraLAX 17 g  p.o. twice daily -BM x 2 on 09/28/2021 BM on 09/30/2021. Discomfort relieved.   Bradycardia -Resolved.  -Ongoing as is hypokalemia. Last dose of Lokelma at 0400 am. Potassium is currently 4.5. -Likely due to medications and hyperkalemia -Coreg is currently on hold -Cardiology considering PYP scan to exclude ATTR cardiac amyloid as outpatient   Hypertension -Blood pressure is high today. -Coreg on hold due to bradycardia -Hydralazine has been increased to 50 mg q8 in addition to amlodipine. 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 Repair when acute issues are resolved.  I have seen and examined this patient myself. I have spent 34 minutes in her evaluation and care.  DVT prophylaxis: Heparin Code Status: DNR Family Communication: None available Disposition Plan: Home    Janis Cuffe, DO [facility] Direct contact: see [email or web address]  7PM-7AM contact night coverage as above 10/02/2021, 3:09 PM  LOS: 3 days d

## 2021-10-02 NOTE — Progress Notes (Signed)
°  Progress Note    10/02/2021 8:20 AM Hospital Day 5  Subjective:  denies any pain or discomfort in the right groin or right foot.   Afebrile  Gtts:  none  Vitals:   10/02/21 0742 10/02/21 0804  BP: (!) 163/82   Pulse:    Resp:    Temp:  98.6 F (37 C)  SpO2:      Physical Exam: General:  no distress Lungs:  non labored Extremities:  right groin with pulsatile mass that appears stable.  Right foot is warm and well perfused with motor and sensory in tact.   CBC    Component Value Date/Time   WBC 8.5 10/02/2021 0105   RBC 3.32 (L) 10/02/2021 0105   HGB 9.3 (L) 10/02/2021 0105   HCT 28.7 (L) 10/02/2021 0105   PLT 437 (H) 10/02/2021 0105   MCV 86.4 10/02/2021 0105   MCH 28.0 10/02/2021 0105   MCHC 32.4 10/02/2021 0105   RDW 17.7 (H) 10/02/2021 0105   LYMPHSABS 3.0 09/30/2021 0704   MONOABS 1.0 09/30/2021 0704   EOSABS 0.4 09/30/2021 0704   BASOSABS 0.1 09/30/2021 0704    BMET    Component Value Date/Time   NA 131 (L) 10/02/2021 0105   K 4.9 10/02/2021 0105   K 5.1 10/02/2021 0105   CL 102 10/02/2021 0105   CO2 20 (L) 10/02/2021 0105   GLUCOSE 148 (H) 10/02/2021 0105   BUN 39 (H) 10/02/2021 0105   CREATININE 3.14 (H) 10/02/2021 0105   CALCIUM 9.0 10/02/2021 0105   GFRNONAA 15 (L) 10/02/2021 0105   GFRAA 30 (L) 07/10/2020 0137    INR    Component Value Date/Time   INR 1.2 07/06/2020 2246     Intake/Output Summary (Last 24 hours) at 10/02/2021 0820 Last data filed at 10/02/2021 5374 Gross per 24 hour  Intake 1080 ml  Output 1150 ml  Net -70 ml     Assessment/Plan:  80 y.o. 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 Oneida Alar in 2011 now with right femoral anastomotic psa Hospital Day 5  -plan was to repair right femoral psa yesterday, however, given pt's medical issues, this was cancelled.  The psa appears stable and we will schedule this in the future once medical issues are more stable.   -ok for oob per vascular  surgery. -mild drop in hemoglobin-do not feel this is related to her right femoral psa.    Leontine Locket, PA-C [facility] [phone] 10/02/2021 8:20 AM

## 2021-10-03 LAB — BASIC METABOLIC PANEL
Anion gap: 12 (ref 5–15)
Anion gap: 11 (ref 5–15)
BUN: 42 mg/dL — ABNORMAL HIGH (ref 8–23)
BUN: 39 mg/dL — ABNORMAL HIGH (ref 8–23)
CO2: 17 mmol/L — ABNORMAL LOW (ref 22–32)
CO2: 17 mmol/L — ABNORMAL LOW (ref 22–32)
Calcium: 9.5 mg/dL (ref 8.9–10.3)
Calcium: 9.6 mg/dL (ref 8.9–10.3)
Chloride: 103 mmol/L (ref 98–111)
Chloride: 104 mmol/L (ref 98–111)
Creatinine, Ser: 2.82 mg/dL — ABNORMAL HIGH (ref 0.44–1.00)
Creatinine, Ser: 2.9 mg/dL — ABNORMAL HIGH (ref 0.44–1.00)
GFR, Estimated: 17 mL/min — ABNORMAL LOW (ref >60)
GFR, Estimated: 16 mL/min — ABNORMAL LOW (ref >60)
Glucose, Bld: 146 mg/dL — ABNORMAL HIGH (ref 70–99)
Glucose, Bld: 143 mg/dL — ABNORMAL HIGH (ref 70–99)
Potassium: 5.2 mmol/L — ABNORMAL HIGH (ref 3.5–5.1)
Potassium: 5.1 mmol/L (ref 3.5–5.1)
Sodium: 132 mmol/L — ABNORMAL LOW (ref 135–145)
Sodium: 132 mmol/L — ABNORMAL LOW (ref 135–145)

## 2021-10-03 LAB — CBC WITH DIFFERENTIAL/PLATELET
Abs Immature Granulocytes: 0.06 10*3/uL (ref 0.00–0.07)
Basophils Absolute: 0.1 10*3/uL (ref 0.0–0.1)
Basophils Relative: 1 %
Eosinophils Absolute: 0.4 10*3/uL (ref 0.0–0.5)
Eosinophils Relative: 4 %
HCT: 29.5 % — ABNORMAL LOW (ref 36.0–46.0)
Hemoglobin: 9.8 g/dL — ABNORMAL LOW (ref 12.0–15.0)
Immature Granulocytes: 1 %
Lymphocytes Relative: 28 %
Lymphs Abs: 3 10*3/uL (ref 0.7–4.0)
MCH: 28.1 pg (ref 26.0–34.0)
MCHC: 33.2 g/dL (ref 30.0–36.0)
MCV: 84.5 fL (ref 80.0–100.0)
Monocytes Absolute: 1.2 10*3/uL — ABNORMAL HIGH (ref 0.1–1.0)
Monocytes Relative: 11 %
Neutro Abs: 5.9 10*3/uL (ref 1.7–7.7)
Neutrophils Relative %: 55 %
Platelets: 357 10*3/uL (ref 150–400)
RBC: 3.49 MIL/uL — ABNORMAL LOW (ref 3.87–5.11)
RDW: 18 % — ABNORMAL HIGH (ref 11.5–15.5)
WBC: 10.7 10*3/uL — ABNORMAL HIGH (ref 4.0–10.5)
nRBC: 0 % (ref 0.0–0.2)

## 2021-10-03 LAB — POTASSIUM: Potassium: 5.3 mmol/L — ABNORMAL HIGH (ref 3.5–5.1)

## 2021-10-03 MED ORDER — OXYCODONE HCL 5 MG PO TABS
5.0000 mg | ORAL_TABLET | Freq: Four times a day (QID) | ORAL | Status: DC | PRN
  Administered 2021-10-03 – 2021-10-07 (×14): 5 mg via ORAL
  Filled 2021-10-03 (×14): qty 1

## 2021-10-03 MED ORDER — OXYCODONE HCL 5 MG PO TABS
5.0000 mg | ORAL_TABLET | Freq: Four times a day (QID) | ORAL | Status: AC | PRN
  Administered 2021-10-03 (×2): 5 mg via ORAL
  Filled 2021-10-03 (×2): qty 1

## 2021-10-03 MED ORDER — SODIUM BICARBONATE 650 MG PO TABS
1300.0000 mg | ORAL_TABLET | Freq: Three times a day (TID) | ORAL | Status: DC
  Administered 2021-10-03 – 2021-10-09 (×19): 1300 mg via ORAL
  Filled 2021-10-03 (×20): qty 2

## 2021-10-03 NOTE — Evaluation (Signed)
Physical Therapy Evaluation Patient Details Name: Alyssa [name] MRN: [id number] DOB: 1941-07-30 Today's Date: 10/03/2021  History of Present Illness  The pt is a 80 yo female presenting 12/10 with bradycardia in the 30s and fatigue. Pt found to be hyperkalemic, and also with R femoral aneurysm. Initially planned for repair 12/15, but not medically stable for OR at that time. Of note, pt with recent hospitalization for hypertensive crisis, SNF recommended but pt d/c home instead on 12/1. 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   Clinical Impression  Pt in bed upon arrival of PT, agreeable to evaluation at this time. Prior to admission the pt was mobilizing independently with use of 4-wheel walker in the home, reliant on family for IADLs.  The pt now presents with limitations in functional mobility, strength, power, stability, and endurance due to above dx, and will continue to benefit from skilled PT to address these deficits. The pt was able to complete bed mobility with increased time, effort, and use of bed rails, but no assist. She also completed sit-stand transfers with minG for safety, but demos good initial stability. She required BUE support on RW to complete short bout of ambulation, but was limited by fatigue and HR elevation to 139 bpm with activity. Discussed availability of assist and pt and her daughter report they feel comfortable assisting pt with mobility and ADLs. The pt will continue to benefit from skilled PT to progress endurance and power to complete transfers with improved stability.         Recommendations for follow up therapy are one component of a multi-disciplinary discharge planning process, led by the attending physician.  Recommendations may be updated based on patient status, additional functional criteria and insurance authorization.  Follow Up Recommendations Home health PT    Assistance Recommended at Discharge Frequent or constant  Supervision/Assistance  Functional Status Assessment Patient has had a recent decline in their functional status and demonstrates the ability to make significant improvements in function in a reasonable and predictable amount of time.  Equipment Recommendations  None recommended by PT    Recommendations for Other Services       Precautions / Restrictions Precautions Precautions: Fall Precaution Comments: Watch HR Restrictions Weight Bearing Restrictions: No      Mobility  Bed Mobility Overal bed mobility: Needs Assistance Bed Mobility: Sit to Supine;Rolling;Supine to Sit Rolling: Supervision Sidelying to sit: Min guard Supine to sit: Supervision Sit to supine: Supervision   General bed mobility comments: pt able to complete without assist, increased time to complete    Transfers Overall transfer level: Needs assistance Equipment used: Rolling walker (2 wheels) Transfers: Sit to/from Stand Sit to Stand: Min guard                Ambulation/Gait Ambulation/Gait assistance: Herbalist (Feet): 45 Feet Assistive device: Rolling walker (2 wheels) Gait Pattern/deviations: Step-through pattern;Decreased stride length;Decreased dorsiflexion - right;Decreased weight shift to right            Balance Overall balance assessment: Needs assistance Sitting-balance support: Feet supported Sitting balance-Leahy Scale: Good Sitting balance - Comments: pt could maintain static sitting and reach to BLE   Standing balance support: Reliant on assistive device for balance Standing balance-Leahy Scale: Fair Standing balance comment: can static stand without UE support, requires assist for posterior pericare  Pertinent Vitals/Pain Pain Assessment: Faces Faces Pain Scale: Hurts a little bit Pain Location: L-side abdomen Pain Descriptors / Indicators: Discomfort Pain Intervention(s): Monitored during session;Premedicated  before session;Repositioned    Home Living Family/patient expects to be discharged to:: Private residence Living Arrangements: Children (Now lives with son and the patient's sister.) Available Help at Discharge: Friend(s);Available PRN/intermittently Type of Home: House Home Access: Stairs to enter Entrance Stairs-Rails: Can reach both;Left;Right Entrance Stairs-Number of Steps: 4   Home Layout: One level Home Equipment: Conservation officer, nature (2 wheels);Rollator (4 wheels);Tub bench;BSC/3in1 Additional Comments: 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    Prior Function Prior Level of Function : Needs assist       Physical Assist : Mobility (physical);ADLs (physical)     Mobility Comments: Generally walks in the home with Mod I and 4WRW ADLs Comments: Able to complete her own self care, son cooks and drives, sister performs home management and helps with medications.     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 unable to demo good functional DF    Cervical / Trunk Assessment Cervical / Trunk Assessment: Kyphotic Cervical / Trunk Exceptions: h/o R THA and LLD  Communication   Communication: No difficulties  Cognition Arousal/Alertness: Awake/alert Behavior During Therapy: WFL for tasks assessed/performed;Flat affect Overall Cognitive Status: Within Functional Limits for tasks assessed                                 General Comments: Per chart, has h/o dementia.        General Comments General comments (skin integrity, edema, etc.): HR to 139 with ambulation    Exercises     Assessment/Plan    PT Assessment Patient needs continued  PT services  PT Problem List Decreased strength;Decreased activity tolerance;Decreased balance;Decreased mobility;Pain       PT Treatment Interventions DME instruction;Gait training;Functional mobility training;Therapeutic activities;Therapeutic exercise;Balance training;Patient/family education    PT Goals (Current goals can be found in the Care Plan section)  Acute Rehab PT Goals Patient Stated Goal: decreased pain PT Goal Formulation: With patient Time For Goal Achievement: 09/28/21 Potential to Achieve Goals: Good    Frequency Min 3X/week    AM-PAC PT "6 Clicks" Mobility  Outcome Measure Help needed turning from your back to your side while in a flat bed without using bedrails?: A Little Help needed moving from lying on your back to sitting on the side of a flat bed without using bedrails?: A Little Help needed moving to and from a bed to a chair (including a wheelchair)?: A Little Help needed standing up from a chair using your arms (e.g., wheelchair or bedside chair)?: A Little Help needed to walk in hospital room?: A Little Help needed climbing 3-5 steps with a railing? : Total 6 Click Score: 16    End of Session Equipment Utilized During Treatment: Gait belt Activity Tolerance: Patient limited by pain Patient left: with call bell/phone within reach;in bed;with bed alarm set;with family/visitor present Nurse Communication: Mobility status PT Visit Diagnosis: Muscle weakness (generalized) (M62.81);Difficulty in walking, not elsewhere classified (R26.2)    Time: 6967-8938 PT Time Calculation (min) (ACUTE ONLY): 20 min  Charges:   PT Evaluation $PT Eval Low Complexity: 1 Low          West Carbo, PT, DPT   Acute Rehabilitation Department Pager #: [phone]  Sandra Cockayne 10/03/2021, 1:57 PM

## 2021-10-03 NOTE — Progress Notes (Signed)
PROGRESS NOTE  Alyssa [name] GYB:[id number] DOB: January 23, 1941 DOA: 09/26/2021 PCP: System, Provider Not In  Brief History   80 year old female with a history of hypertension, hyperlipidemia, PVD, stage IV CKD, COPD, dementia, who was recently discharged from the hospital on 09/17/2021 after she was treated for hypertensive emergency, came to ED with complaints of 1 week history of increasing fatigue, mild Exertion, lightheadedness.  In the ED she was found to have heart rate of 30, creatinine 4.4 and potassium 6.9.  Patient was admitted under CCM service and cardiology was consulted. She has been transferred to step down status. This morning he received another dose of lokelma. He remains hyperkalemic at 5.5 and bradycardic. She is not symptomatic. Monitor potassium.  The patient states that she is feeling a little better. She has had a BM. However, her potassium continues to be elevated despite lokelma, insulin, and glucose.  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   Hydralazine has been started to address hypertension. Blood pressure is improved.  Consultants  Nephrology Cardiology  Procedures  None  Antibiotics   Anti-infectives (From admission, onward)    Start     Dose/Rate Route Frequency Ordered Stop   10/01/21 0600  cefUROXime (ZINACEF) 1.5 g in sodium chloride 0.9 % 100 mL IVPB        1.5 g 200 mL/hr over 30 Minutes Intravenous On call to O.R. 09/30/21 0815 10/02/21 0559      Subjective  The patient is resting comfortably. No new complaints. Bradycardia resolved. Objective   Vitals:  Vitals:   10/03/21 0856 10/03/21 1120  BP:  120/73  Pulse: 99 96  Resp: 19 15  Temp:  97.8 F (36.6 C)  SpO2: 97% 98%    Exam:  Constitutional:  The patient is awake, alert, and oriented x 3. No acute distress. Respiratory:  No increased work of breathing. No wheezes, rales, or rhonchi No tactile  fremitus Cardiovascular:  Regular rate and rhythm No murmurs, ectopy, or gallups. No lateral PMI. No thrills. Abdomen:  Abdomen is soft, non-tender, non-distended No hernias, masses, or organomegaly Normoactive bowel sounds.  Musculoskeletal:  No cyanosis, clubbing, or edema Skin:  No rashes, lesions, ulcers palpation of skin: no induration or nodules Neurologic:  CN 2-12 intact Sensation all 4 extremities intact Psychiatric:  Mental status Mood, affect appropriate Orientation to person, place, time  judgment and insight appear intact  I have personally reviewed the following:   Today's Data  Vitals  Lab Data  BMP CBC Glucoses  Micro Data  MRSA screen negative  Imaging  CXR  Cardiology Data  EKG  Scheduled Meds:  amLODipine  10 mg Oral Daily   Chlorhexidine Gluconate Cloth  6 each Topical Daily   feeding supplement (NEPRO CARB STEADY)  237 mL Oral BID BM   heparin  5,000 Units Subcutaneous Q8H   hydrALAZINE  50 mg Oral Q8H   lidocaine  1 patch Transdermal Q24H   multivitamin  1 tablet Oral QHS   pantoprazole  40 mg Oral QHS   polyethylene glycol  17 g Oral BID   pregabalin  75 mg Oral Daily   sodium bicarbonate  1,300 mg Oral TID   Continuous Infusions:  sodium chloride      Problem  Complications Due to Renal Dialysis Device, Implant, and Graft      LOS: 7 days   A & P  Acute kidney injury on CKD stage IV -Patient baseline creatinine is around  2.75-2.85 -Presented with creatinine of 4.4 -Today creatinine has improved to 2.98. -Nephrology has been consulted.    Hyperkalemia -Patient presented with a potassium of 6.9, back up to 6.4 on 09/29/2021. She has received Lokelma x 2, sodium bicarb, albuterol, calcium gluconate. 54.5 currently. Continue to monitor and treat. Patient is no longer bradycardic.Dr. Justin Mend has recommended getting a chemistry drawn without a tourniquet in order to more accurately to evaluate her serum potassium status.   Left  upper quadrant pain -Patient had extensive work-up during previous admission at that time CT abdomen pelvis did not show significant abnormality -Patient is constipated -We will give Dulcolax suppository 10 mg x 1 -Also start MiraLAX 17 g p.o. twice daily -BM x 2 on 09/28/2021 BM on 09/30/2021. Discomfort relieved.   Bradycardia -Resolved.  -Ongoing as is hypokalemia. Last dose of Lokelma at 0400 am. Potassium is currently 4.5. -Likely due to medications and hyperkalemia -Coreg is currently on hold -Cardiology considering PYP scan to exclude ATTR cardiac amyloid as outpatient   Hypertension -Blood pressure is improved after increase in hydralazine dose. -Coreg on hold due to bradycardia -Hydralazine has been increased to 50 mg q8 in addition to amlodipine. 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 Repair when acute issues are resolved.  I have seen and examined this patient myself. I have spent 32 minutes in her evaluation and care.  DVT prophylaxis: Heparin Code Status: DNR Family Communication: None available Disposition Plan: Home    Alyssa Mcmasters, DO [facility] Direct contact: see [email or web address]  7PM-7AM contact night coverage as above 10/03/2021, 2:40 PM  LOS: 3 days d

## 2021-10-03 NOTE — Plan of Care (Signed)

## 2021-10-03 NOTE — Evaluation (Signed)
Occupational Therapy Evaluation Patient Details Name: Alyssa [name] MRN: [id number] DOB: [birth year] Today's Date: 10/03/2021   History of Present Illness 80 year old female with a history of hypertension, hyperlipidemia, PVD, stage IV CKD, COPD, dementia, who was recently discharged from the hospital on 09/17/2021 after she was treated for hypertensive emergency, returned to the ED with complaints of 1 week history of increasing fatigue, mild  Exertion, lightheadedness.  Found to have heart rate of 30, creatinine 4.4 and potassium 6.9.  Currenlty potassium level is 5.2.   Clinical Impression   Patient admitted for the diagnosis above.  PTA she is living at her son's home along with her sister.  She generally walks with a 4WRW, needs no assist with toileting or self care, but family does assist with  meals, home management, medications and community mobility.  Deficits impacting independence are listed below.  Patient is currently receiving HH OT/PT at home, and would like to continue this.  OT will follow in the acute setting to maximize her functional status.  Currently she is needing up to [facility] for mobility and self care from a sit/stand level.  HR to 140 with stand grooming task.        Recommendations for follow up therapy are one component of a multi-disciplinary discharge planning process, led by the attending physician.  Recommendations may be updated based on patient status, additional functional criteria and insurance authorization.   Follow Up Recommendations  Home health OT    Assistance Recommended at Discharge Intermittent Supervision/Assistance  Functional Status Assessment  Patient has had a recent decline in their functional status and demonstrates the ability to make significant improvements in function in a reasonable and predictable amount of time.  Equipment Recommendations  None recommended by OT    Recommendations for Other Services       Precautions /  Restrictions Precautions Precautions: Fall Precaution Comments: Watch HR Restrictions Weight Bearing Restrictions: No      Mobility Bed Mobility Overal bed mobility: Needs Assistance Bed Mobility: Sidelying to Sit;Sit to Supine   Sidelying to sit: Min guard   Sit to supine: Min guard        Transfers Overall transfer level: Needs assistance Equipment used: Rolling walker (2 wheels) Transfers: Sit to/from Stand Sit to Stand: Min guard                  Balance Overall balance assessment: Needs assistance Sitting-balance support: Feet supported Sitting balance-Leahy Scale: Good     Standing balance support: Reliant on assistive device for balance Standing balance-Leahy Scale: Fair                             ADL either performed or assessed with clinical judgement   ADL       Grooming: Wash/dry hands;Wash/dry face;Oral care;Min guard;Standing       Lower Body Bathing: Min guard;Sit to/from stand       Lower Body Dressing: Min guard;Sit to/from stand   Toilet Transfer: Min guard;Ambulation;Rolling walker (2 wheels)   Toileting- Clothing Manipulation and Hygiene: Supervision/safety;Sit to/from stand               Vision Baseline Vision/History: 1 Wears glasses Patient Visual Report: No change from baseline       Perception Perception Perception: Within Functional Limits   Praxis Praxis Praxis: Intact    Pertinent Vitals/Pain Pain Assessment: Faces Faces Pain Scale: Hurts a little bit Pain Location: L-side  abdomen Pain Descriptors / Indicators: Discomfort Pain Intervention(s): Monitored during session     Hand Dominance Right   Extremity/Trunk Assessment Upper Extremity Assessment Upper Extremity Assessment: Generalized weakness   Lower Extremity Assessment Lower Extremity Assessment: Defer to PT evaluation   Cervical / Trunk Assessment Cervical / Trunk Assessment: Kyphotic   Communication  Communication Communication: No difficulties   Cognition Arousal/Alertness: Awake/alert Behavior During Therapy: WFL for tasks assessed/performed;Flat affect Overall Cognitive Status: Within Functional Limits for tasks assessed                                 General Comments: Per chart, has h/o dementia.     General Comments       Exercises     Shoulder Instructions      Home Living Family/patient expects to be discharged to:: Private residence Living Arrangements: Children (Now lives with son and the patient's sister.) Available Help at Discharge: Friend(s);Available PRN/intermittently Type of Home: House Home Access: Stairs to enter [employer] of Steps: 4 Entrance Stairs-Rails: Can reach both;Left;Right Home Layout: One level     Bathroom Shower/Tub: Tub/shower unit;Walk-in shower   Bathroom Toilet: Handicapped height Bathroom Accessibility: Yes How Accessible: Accessible via walker Home Equipment: [place] (2 wheels);Rollator (4 wheels);Tub bench;BSC/3in1   Additional Comments: 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      Prior Functioning/Environment Prior Level of Function : Needs assist       Physical Assist : Mobility (physical);ADLs (physical)     Mobility Comments: Generally walks in the home with Mod I and 4WRW ADLs Comments: Able to complete her own self care, son cooks and drives, sister performs home management and helps with medications.        OT Problem List: Decreased strength;Decreased activity tolerance;Pain      OT Treatment/Interventions: Self-care/ADL training;Balance training;Patient/family education;Therapeutic activities    OT Goals(Current goals can be found in the care plan section) Acute Rehab OT Goals Patient Stated Goal: Return home OT Goal Formulation: With patient Time For Goal Achievement: 10/17/21 Potential to Achieve Goals: Good ADL Goals Pt Will Perform  Grooming: with modified independence;standing Pt Will Perform Lower Body Bathing: with modified independence;sit to/from stand Pt Will Perform Lower Body Dressing: with modified independence;sit to/from stand Pt Will Transfer to Toilet: with modified independence;regular height toilet;ambulating Pt Will Perform Toileting - Clothing Manipulation and hygiene: with modified independence;sit to/from stand  OT Frequency: Min 2X/week   Barriers to D/C:    None noted       Co-evaluation              AM-PAC OT "6 Clicks" Daily Activity     Outcome Measure Help from another person eating meals?: A Little Help from another person taking care of personal grooming?: A Little Help from another person toileting, which includes using toliet, bedpan, or urinal?: A Little Help from another person bathing (including washing, rinsing, drying)?: A Little Help from another person to put on and taking off regular upper body clothing?: A Little Help from another person to put on and taking off regular lower body clothing?: A Little 6 Click Score: 18   End of Session Equipment Utilized During Treatment: Rolling walker (2 wheels) Nurse Communication: Mobility status  Activity Tolerance: Patient tolerated treatment well Patient left: in bed;with call bell/phone within reach;Other (comment) (lab tech at bedside)  OT Visit Diagnosis: Unsteadiness on feet (R26.81);Muscle weakness (  generalized) (M62.81);Pain                Time: 8592-7639 OT Time Calculation (min): 29 min Charges:  OT General Charges $OT Visit: 1 Visit OT Evaluation $OT Eval Moderate Complexity: 1 Mod OT Treatments $Self Care/Home Management : 8-22 mins  10/03/2021  RP, OTR/L  Acute Rehabilitation Services  Office:  [phone]   Metta Clines 10/03/2021, 11:06 AM

## 2021-10-03 NOTE — Consult Note (Signed)
[facility] Renal Consultation Note  Requesting MD:  Indication for Consultation: Chronic kidney disease stage IV, maintenance of euvolemia, assessment treatment of anemia, assessment treatment of secondary hyperparathyroidism.  HPI:   Alyssa [name] is a 80 y.o. female.  With a history of stage IV chronic kidney disease baseline serum creatinine with history about 2.5 to 3 mg/dL.  She was last seen in [facility] in January 2022 with Dr. Joelyn Oms.  It appears at that time the creatinine was 2.5 mg/dL.  She presented to [facility] bradycardia and hyperkalemia.  Medical therapy with [name] has led to the improvement of her potassium.  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  However due to the hyperkalemia procedure was canceled.  Blood pressure 132/78 pulse 96 temperature 97.8 O2 sats 97% room air  Sodium 132 potassium 5.2 chloride 103 CO2 17 BUN 42 creatinine 3.82 glucose 146 hemoglobin 9.8  Lokelma administered 10/02/2019 to 10/02/2021  Home medications: Amlodipine 10 mg daily, aspirin 81 mg daily carvedilol 12.5 mg twice daily clonidine 0.1 mg twice daily Plavix 75 mg daily Aricept 5 mg daily, Megace 40 mg twice daily Protonix 40 mg daily sodium bicarbonate 650 mg 2 tablets twice daily valproic acid 750 mg nightly  Creatinine, Ser  Date/Time Value Ref Range Status  10/03/2021 04:32 AM 2.82 (H) 0.44 - 1.00 mg/dL Final  10/02/2021 01:05 AM 3.14 (H) 0.44 - 1.00 mg/dL Final  10/01/2021 06:32 AM 2.98 (H) 0.44 - 1.00 mg/dL Final  09/30/2021 07:04 AM 3.19 (H) 0.44 - 1.00 mg/dL Final  09/29/2021 11:06 AM 3.63 (H) 0.44 - 1.00 mg/dL Final  09/29/2021 06:02 AM 3.85 (H) 0.44 - 1.00 mg/dL Final  09/29/2021 01:16 AM 3.52 (H) 0.44 - 1.00 mg/dL Final  09/27/2021 06:59 AM 3.83 (H) 0.44 - 1.00 mg/dL Final  09/27/2021 12:00 AM 3.80 (H) 0.44 - 1.00 mg/dL Final  09/26/2021 10:55 PM 2.56 (H) 0.44 - 1.00 mg/dL Final     Comment:    DELTA CHECK NOTED  09/26/2021 05:21 PM 4.37 (H) 0.44 - 1.00 mg/dL Final  09/16/2021 12:26 AM 2.66 (H) 0.44 - 1.00 mg/dL Final  09/15/2021 03:55 AM 2.41 (H) 0.44 - 1.00 mg/dL Final  09/14/2021 12:34 AM 2.57 (H) 0.44 - 1.00 mg/dL Final  09/13/2021 12:46 AM 2.93 (H) 0.44 - 1.00 mg/dL Final  09/12/2021 12:52 AM 3.02 (H) 0.44 - 1.00 mg/dL Final  09/11/2021 06:51 AM 2.53 (H) 0.44 - 1.00 mg/dL Final  09/10/2021 02:46 PM 2.78 (H) 0.44 - 1.00 mg/dL Final  08/07/2021 01:30 PM 2.75 (H) 0.44 - 1.00 mg/dL Final  07/25/2021 02:30 PM 2.83 (H) 0.44 - 1.00 mg/dL Final  07/18/2021 12:19 PM 2.30 (H) 0.44 - 1.00 mg/dL Final  07/18/2021 12:18 PM 2.28 (H) 0.44 - 1.00 mg/dL Final  07/09/2021 04:11 AM 1.93 (H) 0.44 - 1.00 mg/dL Final  07/08/2021 03:42 AM 2.08 (H) 0.44 - 1.00 mg/dL Final  07/07/2021 04:51 AM 2.08 (H) 0.44 - 1.00 mg/dL Final    Comment:    DELTA CHECK NOTED  07/05/2021 05:21 AM 2.53 (H) 0.44 - 1.00 mg/dL Final  07/04/2021 03:11 AM 2.81 (H) 0.44 - 1.00 mg/dL Final  07/03/2021 04:17 AM 2.73 (H) 0.44 - 1.00 mg/dL Final  07/02/2021 03:26 AM 2.63 (H) 0.44 - 1.00 mg/dL Final  07/01/2021 04:22 AM 2.32 (H) 0.44 - 1.00 mg/dL Final  06/30/2021 04:34 AM 2.36 (H) 0.44 - 1.00 mg/dL Final  06/29/2021 01:20 AM 2.42 (H) 0.44 -  1.00 mg/dL Final  06/28/2021 08:02 AM 2.43 (H) 0.44 - 1.00 mg/dL Final  06/27/2021 03:28 AM 2.57 (H) 0.44 - 1.00 mg/dL Final  06/26/2021 06:30 AM 2.73 (H) 0.44 - 1.00 mg/dL Final  06/25/2021 04:19 AM 2.99 (H) 0.44 - 1.00 mg/dL Final  06/24/2021 08:40 AM 2.98 (H) 0.44 - 1.00 mg/dL Final  06/23/2021 04:33 AM 3.14 (H) 0.44 - 1.00 mg/dL Final  06/22/2021 02:29 AM 3.02 (H) 0.44 - 1.00 mg/dL Final  06/21/2021 01:30 PM 3.07 (H) 0.44 - 1.00 mg/dL Final  06/21/2021 01:30 PM 3.13 (H) 0.44 - 1.00 mg/dL Final  06/19/2021 05:39 AM 3.34 (H) 0.44 - 1.00 mg/dL Final  06/18/2021 06:25 AM 3.31 (H) 0.44 - 1.00 mg/dL Final  06/17/2021 03:02 AM 2.99 (H) 0.44 - 1.00 mg/dL Final  06/15/2021  05:40 PM 3.17 (H) 0.44 - 1.00 mg/dL Final  10/17/2020 12:30 PM 2.90 (H) 0.44 - 1.00 mg/dL Final  10/17/2020 12:17 PM 2.61 (H) 0.44 - 1.00 mg/dL Final  10/03/2020 01:24 AM 2.22 (H) 0.44 - 1.00 mg/dL Final  10/02/2020 04:02 AM 2.44 (H) 0.44 - 1.00 mg/dL Final  10/01/2020 10:50 AM 2.16 (H) 0.44 - 1.00 mg/dL Final  09/30/2020 12:56 PM 2.30 (H) 0.44 - 1.00 mg/dL Final  09/29/2020 08:05 PM 2.55 (H) 0.44 - 1.00 mg/dL Final     PMHx:   Past Medical History:  Diagnosis Date   Acute respiratory failure with hypoxia ([place]) 11/01/2019   Anxiety    Arthritis    Chest pain    Chronic kidney disease    Dyspnea    Elevated troponin 12/13/2018   Headache(784.0)    Hyperlipidemia    Hypertension    Joint pain    Leg pain    Peripheral neuropathy    Peripheral vascular disease ([place])    Syncope 09/2020    Past Surgical History:  Procedure Laterality Date   ABDOMINAL ANGIOGRAM N/A 10/29/2011   Procedure: ABDOMINAL ANGIOGRAM;  Surgeon: Elam Dutch, MD;  Location: [facility] CATH LAB;  Service: Cardiovascular;  Laterality: N/A;   BACK SURGERY     COLONOSCOPY     ESOPHAGOGASTRODUODENOSCOPY     FEMORAL-FEMORAL BYPASS GRAFT  08/31/2010   FLEXIBLE SIGMOIDOSCOPY N/A 06/12/2019   Procedure: FLEXIBLE SIGMOIDOSCOPY;  Surgeon: Ladene Artist, MD;  Location: [place];  Service: Endoscopy;  Laterality: N/A;  Patient had little bit to eat this morning so this will be unsedated.   JOINT REPLACEMENT Right 10/18/2010   Hip   LOWER EXTREMITY ANGIOGRAPHY  06/14/2017   Procedure: Lower Extremity Angiography;  Surgeon: Adrian Prows, MD;  Location: [place] CV LAB;  Service: Cardiovascular;;  Bilateral limited angio performed   RENAL ANGIOGRAPHY N/A 06/14/2017   Procedure: Renal Angiography;  Surgeon: Adrian Prows, MD;  Location: [place] CV LAB;  Service: Cardiovascular;  Laterality: N/A;   TOTAL HIP ARTHROPLASTY     right    Family Hx:  Family History  Problem Relation Age of Onset   Heart attack  Mother    Heart disease Mother    Hyperlipidemia Mother    Hypertension Mother    Heart attack Father    Heart disease Father        Before age 38   Hyperlipidemia Father    Hypertension Father    Cancer Sister        brain tumor   Aneurysm Brother        brain   Colon cancer Neg Hx    Liver cancer Neg Hx  Social History:  reports that she quit smoking about 5 months ago. Her smoking use included cigarettes. She has a 50.00 pack-year smoking history. She has never used smokeless tobacco. She reports that she does not drink alcohol and does not use drugs.  Allergies: No Known Allergies  Medications: Prior to Admission medications   Medication Sig Start Date End Date Taking? Authorizing Provider  acetaminophen (TYLENOL) 650 MG CR tablet Take 650 mg by mouth daily.   Yes [provider]  albuterol (VENTOLIN HFA) 108 (90 Base) MCG/ACT inhaler Inhale 2 puffs into the lungs every 4 (four) hours as needed for wheezing or shortness of breath.   Yes [provider]  amitriptyline (ELAVIL) 10 MG tablet Take 10 mg by mouth daily.   Yes [provider]  amLODipine (NORVASC) 10 MG tablet Take 1 tablet (10 mg total) by mouth daily. 09/18/21  Yes Little Ishikawa, MD  aspirin 81 MG EC tablet Take 1 tablet (81 mg total) by mouth daily. 01/09/19  Yes Gherghe, Vella Redhead, MD  Budeson-Glycopyrrol-Formoterol (BREZTRI AEROSPHERE) 160-9-4.8 MCG/ACT AERO Inhale 2 puffs into the lungs 2 (two) times daily. 07/23/21  Yes Tanda Rockers, MD  carvedilol (COREG) 12.5 MG tablet Take 1 tablet (12.5 mg total) by mouth 2 (two) times daily. 09/17/21  Yes Little Ishikawa, MD  cloNIDine (CATAPRES) 0.1 MG tablet Take 1 tablet (0.1 mg total) by mouth 2 (two) times daily. 09/17/21 11/16/21 Yes Little Ishikawa, MD  clopidogrel (PLAVIX) 75 MG tablet Take 1 tablet (75 mg total) by mouth daily. 01/09/19  Yes Caren Griffins, MD  diclofenac Sodium (VOLTAREN) 1 % GEL Apply 2 g topically  every 6 (six) hours as needed (knee pain). 01/15/20  Yes [provider]  dicyclomine (BENTYL) 20 MG tablet Take 20 mg by mouth 3 (three) times daily. 04/01/20  Yes [provider]  docusate sodium (COLACE) 100 MG capsule Take 1 capsule (100 mg total) by mouth daily. 07/11/21  Yes Kc, Maren Beach, MD  donepezil (ARICEPT) 5 MG tablet Take 1 tablet (5 mg total) by mouth daily. 11/20/20  Yes Cameron Sprang, MD  Ferrous Sulfate (IRON HIGH-POTENCY) 325 MG TABS Take 325 mg by mouth daily with breakfast. Patient taking differently: Take 325 mg by mouth daily. 06/30/19  Yes Hongalgi, Lenis Dickinson, MD  hydrOXYzine (ATARAX/VISTARIL) 25 MG tablet Take 25 mg by mouth at bedtime. 05/12/20  Yes [provider]  lidocaine (LIDODERM) 5 % Place 1 patch onto the skin daily. 08/31/21  Yes [provider]  magnesium oxide (MAG-OX) 400 MG tablet Take 400 mg by mouth 2 (two) times daily.   Yes [provider]  megestrol (MEGACE) 40 MG tablet Take 40 mg by mouth 2 (two) times daily. 05/12/20  Yes [provider]  methocarbamol (ROBAXIN) 500 MG tablet Take 500 mg by mouth 3 (three) times daily.   Yes [provider]  Multiple Vitamins-Minerals (HAIR/SKIN/NAILS) TABS Take 1 tablet by mouth daily.   Yes [provider]  nitroGLYCERIN (NITROSTAT) 0.4 MG SL tablet Place 1 tablet (0.4 mg total) under the tongue every 5 (five) minutes as needed for chest pain. 01/07/20  Yes Allie Bossier, MD  pantoprazole (PROTONIX) 40 MG tablet Take 1 tablet (40 mg total) by mouth daily. 06/16/19  Yes Hongalgi, Lenis Dickinson, MD  pregabalin (LYRICA) 75 MG capsule Take 1 capsule (75 mg total) by mouth daily. 07/11/21 11/17/21 Yes Antonieta Pert, MD  sodium bicarbonate 650 MG tablet Take 2 tablets (  1,300 mg total) by mouth 2 (two) times daily. 01/07/20  Yes Allie Bossier, MD  valproic acid (DEPAKENE) 250 MG capsule Take 250 mg by mouth at bedtime.   Yes [provider]  Ensure Plus (ENSURE  PLUS) LIQD Take 237 mLs by mouth daily.    [provider]      Labs:  Results for orders placed or performed during the hospital encounter of 09/26/21 (from the past 48 hour(s))  Potassium     Status: None   Collection Time: 10/01/21 12:45 PM  Result Value Ref Range   Potassium 5.1 3.5 - 5.1 mmol/L    Comment: Performed at [facility], [place] [address], [place], [place] [address]  Potassium     Status: None   Collection Time: 10/01/21  3:55 PM  Result Value Ref Range   Potassium 5.1 3.5 - 5.1 mmol/L    Comment: Performed at [facility], [place] [address], [place], [place] [address]  Potassium     Status: None   Collection Time: 10/02/21  1:05 AM  Result Value Ref Range   Potassium 4.9 3.5 - 5.1 mmol/L    Comment: Performed at [facility] [address] [place], [place] [address]  Basic metabolic panel     Status: Abnormal   Collection Time: 10/02/21  1:05 AM  Result Value Ref Range   Sodium 131 (L) 135 - 145 mmol/L   Potassium 5.1 3.5 - 5.1 mmol/L   Chloride 102 98 - 111 mmol/L   CO2 20 (L) 22 - 32 mmol/L   Glucose, Bld 148 (H) 70 - 99 mg/dL    Comment: Glucose reference range applies only to samples taken after fasting for at least 8 hours.   BUN 39 (H) 8 - 23 mg/dL   Creatinine, Ser 3.14 (H) 0.44 - 1.00 mg/dL   Calcium 9.0 8.9 - 10.3 mg/dL   GFR, Estimated 15 (L) >60 mL/min    Comment: (NOTE) Calculated using the CKD-EPI Creatinine Equation (2021)    Anion gap 9 5 - 15    Comment: Performed at [facility] [address], [place], [place] [address]  CBC     Status: Abnormal   Collection Time: 10/02/21  1:05 AM  Result Value Ref Range   WBC 8.5 4.0 - 10.5 K/uL   RBC 3.32 (L) 3.87 - 5.11 MIL/uL   Hemoglobin 9.3 (L) 12.0 - 15.0 g/dL   HCT 28.7 (L) 36.0 - 46.0 %   MCV 86.4 80.0 - 100.0 fL   MCH 28.0 26.0 - 34.0 pg   MCHC 32.4 30.0 - 36.0 g/dL   RDW 17.7 (H) 11.5 - 15.5 %   Platelets 437 (H) 150 - 400 K/uL   nRBC 0.0 0.0 - 0.2 %     Comment: Performed at [facility], [place] [address], [place], [place] [address]  Potassium     Status: None   Collection Time: 10/02/21  8:30 AM  Result Value Ref Range   Potassium 4.5 3.5 - 5.1 mmol/L    Comment: Performed at [facility] [address], [place],  [address]  Vitamin B12     Status: None   Collection Time: 10/02/21  8:31 AM  Result Value Ref Range   Vitamin B-12 751 180 - 914 pg/mL    Comment: (NOTE) This assay is not validated for testing neonatal or myeloproliferative syndrome specimens for Vitamin B12 levels. Performed at [facility], [place] [address], [place], [place] [address]   Iron and TIBC     Status: None   Collection Time: 10/02/21  8:31 AM  Result Value Ref Range   Iron 80 28 - 170 ug/dL   TIBC 272 250 - 450 ug/dL   Saturation Ratios 29 10.4 - 31.8 %   UIBC 192 ug/dL    Comment: Performed at [facility], [place] [address] [place], [place] [address]  Ferritin     Status: Abnormal   Collection Time: 10/02/21  8:31 AM  Result Value Ref Range   Ferritin 625 (H) 11 - 307 ng/mL    Comment: Performed at [facility], [place] [address] [place], [place] [address]  Reticulocytes     Status: Abnormal   Collection Time: 10/02/21  8:31 AM  Result Value Ref Range   Retic Ct Pct 1.5 0.4 - 3.1 %   RBC. 3.62 (L) 3.87 - 5.11 MIL/uL   Retic Count, Absolute 52.9 19.0 - 186.0 K/uL   Immature Retic Fract 20.8 (H) 2.3 - 15.9 %    Comment: Performed at [facility] [address] [place], [place] [address]  Folate     Status: None   Collection Time: 10/02/21  8:31 AM  Result Value Ref Range   Folate 90.3 >5.9 ng/mL    Comment: RESULTS CONFIRMED BY MANUAL DILUTION Performed at [facility], [place] [address] [place], [place] [address]   Potassium     Status: None   Collection Time: 10/02/21  4:30 PM  Result Value Ref Range   Potassium 5.1 3.5 - 5.1 mmol/L    Comment: Performed at [facility] [address],  [place], [place] [address]  Basic metabolic panel     Status: Abnormal   Collection Time: 10/03/21  4:32 AM  Result Value Ref Range   Sodium 132 (L) 135 - 145 mmol/L   Potassium 5.2 (H) 3.5 - 5.1 mmol/L   Chloride 103 98 - 111 mmol/L   CO2 17 (L) 22 - 32 mmol/L   Glucose, Bld 146 (H) 70 - 99 mg/dL    Comment: Glucose reference range applies only to samples taken after fasting for at least 8 hours.   BUN 42 (H) 8 - 23 mg/dL   Creatinine, Ser 2.82 (H) 0.44 - 1.00 mg/dL   Calcium 9.5 8.9 - 10.3 mg/dL   GFR, Estimated 17 (L) >60 mL/min    Comment: (NOTE) Calculated using the CKD-EPI Creatinine Equation (2021)    Anion gap 12 5 - 15    Comment: Performed at [facility] [address] [place], [place] [address]  CBC with Differential/Platelet     Status: Abnormal   Collection Time: 10/03/21  4:32 AM  Result Value Ref Range   WBC 10.7 (H) 4.0 - 10.5 K/uL   RBC 3.49 (L) 3.87 - 5.11 MIL/uL   Hemoglobin 9.8 (L) 12.0 - 15.0 g/dL   HCT 29.5 (L) 36.0 - 46.0 %   MCV 84.5 80.0 - 100.0 fL   MCH 28.1 26.0 - 34.0 pg   MCHC 33.2 30.0 - 36.0 g/dL   RDW 18.0 (H) 11.5 - 15.5 %   Platelets 357 150 - 400 K/uL   nRBC 0.0 0.0 - 0.2 %   Neutrophils Relative % 55 %   Neutro Abs 5.9 1.7 - 7.7 K/uL   Lymphocytes Relative 28 %   Lymphs Abs 3.0 0.7 -  4.0 K/uL   Monocytes Relative 11 %   Monocytes Absolute 1.2 (H) 0.1 - 1.0 K/uL   Eosinophils Relative 4 %   Eosinophils Absolute 0.4 0.0 - 0.5 K/uL   Basophils Relative 1 %   Basophils Absolute 0.1 0.0 - 0.1 K/uL   Immature Granulocytes 1 %   Abs Immature Granulocytes 0.06 0.00 - 0.07 K/uL    Comment: Performed at [facility] [address], [place], [place] [address]     ROS:  Negative see HPI  Physical Exam: Vitals:   10/03/21 0613 10/03/21 0749  BP: (!) 150/88 132/78  Pulse: 99 96  Resp: 14 15  Temp:  97.8 F (36.6 C)  SpO2:  97%     General: Alert awake oriented nondistressed HEENT: Mucous membranes moist Eyes: Pupils round  equal reactive extraocular movements normal Neck: Supple no thyromegaly JVP not elevated Heart: Regular rate and rhythm with no murmurs rubs gallops Lungs: Clear to auscultation no wheezes rales Abdomen: Soft nontender bowel sounds present Extremities: No cyanosis clubbing or edema Skin: No rashes lesions ulcers Neuro: Grossly intact  Assessment/Plan: Chronic renal insufficiency stage IV baseline serum creatinine 2.5 to 3 mg/dL.  Followed at [facility] with Dr. Joelyn Oms.  Really there is no worsening of renal function.  This appears to be stable chronic kidney disease.  Would continue to avoid nephrotoxins and avoid ACE inhibitor's ARB's in the setting of her hyperkalemia.  I would avoid nonsteroidal anti-inflammatory drugs.  Renally adjust medications. 2. Hypertension/volume  -appears adequately controlled at this time. 3.  Does not appear to be an issue at this time. 4.  Bone mineral.  Continue to follow renal panel 5.  Metabolic acidosis we will increase sodium bicarbonate to 2 tab p.o. 3 times daily 6.  Hyperkalemia we will need to stressed the importance of a low potassium diet.  Avoid any medications that could potentially raise potassium.  Would check labs with no tourniquet if there is a question about her ongoing hypokalemia.  May need regular dosing of Lokelma. 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  Bradycardia review resolved likely secondary to hyperkalemia    Sherril Croon 10/03/2021, 8:48 AM

## 2021-10-04 LAB — CBC WITH DIFFERENTIAL/PLATELET
Abs Immature Granulocytes: 0.04 10*3/uL (ref 0.00–0.07)
Basophils Absolute: 0.1 10*3/uL (ref 0.0–0.1)
Basophils Relative: 1 %
Eosinophils Absolute: 0.4 10*3/uL (ref 0.0–0.5)
Eosinophils Relative: 4 %
HCT: 26.6 % — ABNORMAL LOW (ref 36.0–46.0)
Hemoglobin: 9 g/dL — ABNORMAL LOW (ref 12.0–15.0)
Immature Granulocytes: 0 %
Lymphocytes Relative: 31 %
Lymphs Abs: 3 10*3/uL (ref 0.7–4.0)
MCH: 28.7 pg (ref 26.0–34.0)
MCHC: 33.8 g/dL (ref 30.0–36.0)
MCV: 84.7 fL (ref 80.0–100.0)
Monocytes Absolute: 1.1 10*3/uL — ABNORMAL HIGH (ref 0.1–1.0)
Monocytes Relative: 11 %
Neutro Abs: 5 10*3/uL (ref 1.7–7.7)
Neutrophils Relative %: 53 %
Platelets: 404 10*3/uL — ABNORMAL HIGH (ref 150–400)
RBC: 3.14 MIL/uL — ABNORMAL LOW (ref 3.87–5.11)
RDW: 18.3 % — ABNORMAL HIGH (ref 11.5–15.5)
WBC: 9.6 10*3/uL (ref 4.0–10.5)
nRBC: 0 % (ref 0.0–0.2)

## 2021-10-04 LAB — BASIC METABOLIC PANEL
Anion gap: 9 (ref 5–15)
BUN: 41 mg/dL — ABNORMAL HIGH (ref 8–23)
CO2: 21 mmol/L — ABNORMAL LOW (ref 22–32)
Calcium: 9.6 mg/dL (ref 8.9–10.3)
Chloride: 105 mmol/L (ref 98–111)
Creatinine, Ser: 2.93 mg/dL — ABNORMAL HIGH (ref 0.44–1.00)
GFR, Estimated: 16 mL/min — ABNORMAL LOW (ref >60)
Glucose, Bld: 113 mg/dL — ABNORMAL HIGH (ref 70–99)
Potassium: 5.5 mmol/L — ABNORMAL HIGH (ref 3.5–5.1)
Sodium: 135 mmol/L (ref 135–145)

## 2021-10-04 LAB — POTASSIUM: Potassium: 6.6 mmol/L (ref 3.5–5.1)

## 2021-10-04 MED ORDER — SODIUM CHLORIDE 0.9 % IV SOLN: INTRAVENOUS | Status: DC

## 2021-10-04 MED ORDER — SODIUM ZIRCONIUM CYCLOSILICATE 10 G PO PACK
10.0000 g | PACK | Freq: Once | ORAL | Status: AC
  Administered 2021-10-04: 18:00:00 10 g via ORAL
  Filled 2021-10-04: qty 1

## 2021-10-04 NOTE — Plan of Care (Signed)

## 2021-10-04 NOTE — Progress Notes (Signed)
Lokelma 10g provided for K 6.6, IV fluid started @ 100, pt is having low intake and output, Calorie count is in progress. Vitals stable and will continue to monitor the patient  Jaylaa Gallion,RN

## 2021-10-04 NOTE — Progress Notes (Signed)
PROGRESS NOTE  Alyssa [name] RSW:[id number] DOB: 1940-12-13 DOA: 09/26/2021 PCP: System, Provider Not In  Brief History   80 year old female with a history of hypertension, hyperlipidemia, PVD, stage IV CKD, COPD, dementia, who was recently discharged from the hospital on 09/17/2021 after she was treated for hypertensive emergency, came to ED with complaints of 1 week history of increasing fatigue, mild Exertion, lightheadedness.  In the ED she was found to have heart rate of 30, creatinine 4.4 and potassium 6.9.  Patient was admitted under CCM service and cardiology was consulted. She has been transferred to step down status. This morning she received another dose of lokelma. She remains hyperkalemic at 5.5 and bradycardic. She is not symptomatic. Monitor potassium. Alyssa [name] has been stopped and nephrology consulted. Dr. Justin Mend has recommended checking chemistry without tourniquet. K up to 5.5 today.  The patient states that she is feeling a little better. She has had a BM. However, her potassium continues to be elevated off of lokelma. Last step is to determine how to control the patient's electrolytes as outpatient.   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   Hydralazine has been started to address hypertension. Blood pressure is improved.  Consultants  Nephrology Cardiology  Procedures  None  Antibiotics   Anti-infectives (From admission, onward)    Start     Dose/Rate Route Frequency Ordered Stop   10/01/21 0600  cefUROXime (ZINACEF) 1.5 g in sodium chloride 0.9 % 100 mL IVPB        1.5 g 200 mL/hr over 30 Minutes Intravenous On call to O.R. 09/30/21 0815 10/02/21 0559      Subjective  The patient is resting comfortably. No new complaints. Bradycardia resolved. Sister is at bedside. Objective   Vitals:  Vitals:   10/04/21 0721 10/04/21 1118  BP: (!) 145/84 119/73  Pulse: (!) 105 (!) 105  Resp: 16 16  Temp: 98.4 F  (36.9 C) 98.4 F (36.9 C)  SpO2: 98% 97%    Exam:  Constitutional:  The patient is awake, alert, and oriented x 3. No acute distress. Respiratory:  No increased work of breathing. No wheezes, rales, or rhonchi No tactile fremitus Cardiovascular:  Regular rate and rhythm No murmurs, ectopy, or gallups. No lateral PMI. No thrills. Abdomen:  Abdomen is soft, non-tender, non-distended No hernias, masses, or organomegaly Normoactive bowel sounds.  Musculoskeletal:  No cyanosis, clubbing, or edema Skin:  No rashes, lesions, ulcers palpation of skin: no induration or nodules Neurologic:  CN 2-12 intact Sensation all 4 extremities intact Psychiatric:  Mental status Mood, affect appropriate Orientation to person, place, time  judgment and insight appear intact  I have personally reviewed the following:   Today's Data  Vitals  Lab Data  BMP CBC Glucoses  Micro Data  MRSA screen negative  Imaging  CXR  Cardiology Data  EKG  Scheduled Meds:  amLODipine  10 mg Oral Daily   Chlorhexidine Gluconate Cloth  6 each Topical Daily   feeding supplement (NEPRO CARB STEADY)  237 mL Oral BID BM   heparin  5,000 Units Subcutaneous Q8H   hydrALAZINE  50 mg Oral Q8H   lidocaine  1 patch Transdermal Q24H   multivitamin  1 tablet Oral QHS   pantoprazole  40 mg Oral QHS   polyethylene glycol  17 g Oral BID   pregabalin  75 mg Oral Daily   sodium bicarbonate  1,300 mg Oral TID   Continuous Infusions:  sodium chloride  Problem  Complications Due to Renal Dialysis Device, Implant, and Graft      LOS: 8 days   A & P  Acute kidney injury on CKD stage IV -Patient baseline creatinine is around 2.75-2.85 -Presented with creatinine of 4.4 -Today creatinine has improved to 2.98. -Nephrology has been consulted.    Hyperkalemia -Patient presented with a potassium of 6.9, back up to 6.4 on 09/29/2021. She has received Lokelma x 2, sodium bicarb, albuterol, calcium  gluconate. 54.5 currently. Continue to monitor and treat. Patient is no longer bradycardic.Dr. Justin Mend has recommended getting a chemistry drawn without a tourniquet in order to more accurately to evaluate her serum potassium status and to determine how best to control her potassium as outpatient.   Left upper quadrant pain -Patient had extensive work-up during previous admission at that time CT abdomen pelvis did not show significant abnormality -Patient is constipated -We will give Dulcolax suppository 10 mg x 1 -Also start MiraLAX 17 g p.o. twice daily -BM x 2 on 09/28/2021 BM on 09/30/2021. Discomfort relieved.   Bradycardia -Resolved.  -Ongoing as is hypokalemia. Last dose of Lokelma at 0400 am. Potassium is currently 4.5. -Likely due to medications and hyperkalemia -Coreg is currently on hold -Cardiology considering PYP scan to exclude ATTR cardiac amyloid as outpatient   Hypertension -Blood pressure is improved after increase in hydralazine dose. -Coreg on hold due to bradycardia -Hydralazine has been increased to 50 mg q8 in addition to amlodipine. 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 Repair as outpatient when acute issues are resolved.  I have seen and examined this patient myself. I have spent 32 minutes in her evaluation and care.  DVT prophylaxis: Heparin Code Status: DNR Family Communication: None available Disposition Plan: Home   Alyssa Eardley, DO [facility] Direct contact: see [email or web address]  7PM-7AM contact  night coverage as above 10/04/2021, 1:31 PM  LOS: 3 days d

## 2021-10-05 ENCOUNTER — Other Ambulatory Visit: Payer: Self-pay

## 2021-10-05 ENCOUNTER — Encounter (HOSPITAL_COMMUNITY): Payer: Self-pay | Admitting: Pulmonary Disease

## 2021-10-05 LAB — BASIC METABOLIC PANEL
Anion gap: 9 (ref 5–15)
BUN: 39 mg/dL — ABNORMAL HIGH (ref 8–23)
CO2: 20 mmol/L — ABNORMAL LOW (ref 22–32)
Calcium: 9.1 mg/dL (ref 8.9–10.3)
Chloride: 106 mmol/L (ref 98–111)
Creatinine, Ser: 3.1 mg/dL — ABNORMAL HIGH (ref 0.44–1.00)
GFR, Estimated: 15 mL/min — ABNORMAL LOW (ref >60)
Glucose, Bld: 109 mg/dL — ABNORMAL HIGH (ref 70–99)
Potassium: 5.2 mmol/L — ABNORMAL HIGH (ref 3.5–5.1)
Sodium: 135 mmol/L (ref 135–145)

## 2021-10-05 LAB — POTASSIUM: Potassium: 5.4 mmol/L — ABNORMAL HIGH (ref 3.5–5.1)

## 2021-10-05 MED ORDER — SODIUM CHLORIDE 0.9 % IV SOLN: INTRAVENOUS | Status: AC

## 2021-10-05 NOTE — Progress Notes (Signed)
Mobility Specialist Progress Note    10/05/21 1350  Mobility  Activity Ambulated in hall  Level of Assistance Minimal assist, patient does 75% or more  Assistive Device Front wheel walker  Distance Ambulated (ft) 110 ft  Mobility Ambulated with assistance in hallway  Mobility Response Tolerated fair  Mobility performed by Mobility specialist  $Mobility charge 1 Mobility   Pt received in bed and agreeable. C/o 8/10 pain. Upon return to room, pt had a BM in BR. Returned to bed with call bell in reach and NT present.   [facility] Mobility Specialist  M.S. Primary Phone: [phone] M.S. Secondary Phone: [phone]

## 2021-10-05 NOTE — Progress Notes (Signed)
Occupational Therapy Treatment Patient Details Name: Alyssa [name] MRN: [id number] DOB: 04-07-1941 Today's Date: 10/05/2021   History of present illness The pt is a 80 yo female presenting 12/10 with bradycardia in the 30s and fatigue. Pt found to be hyperkalemic, and also with R femoral aneurysm. Initially planned for repair 12/15, but not medically stable for OR at that time. Of note, pt with recent hospitalization for hypertensive crisis, SNF recommended but pt d/c home instead on 12/1. 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   OT comments  Despite L side/hip pain, pt agreeable to attempt OOB activities. However, with completion of bed mobility at [place], pain increased substantially with deferral for further mobility. Discussed pain mgmt strategies with pt reporting medications usually ease pain (not due for any more during session). Assisted pt in repositioning and applied heat to L hip area. If pain continues to be a limiting factor, may need to consider SNF rehab. Will continue to follow acutely and progress ADL/mobility independence within pt tolerance.  HR 105bpm   Recommendations for follow up therapy are one component of a multi-disciplinary discharge planning process, led by the attending physician.  Recommendations may be updated based on patient status, additional functional criteria and insurance authorization.    Follow Up Recommendations  Home health OT    Assistance Recommended at Discharge Intermittent Supervision/Assistance  Equipment Recommendations  None recommended by OT    Recommendations for Other Services      Precautions / Restrictions Precautions Precautions: Fall Precaution Comments: Watch HR Restrictions Weight Bearing Restrictions: No       Mobility Bed Mobility Overal bed mobility: Needs Assistance Bed Mobility: Supine to Sit;Sit to Supine     Supine to sit: Min assist Sit to supine: Supervision   General bed mobility  comments: Min A to lift trunk though increased L side/hip pain noted so pt returned to supine    Transfers                   General transfer comment: deferred due to pain     Balance Overall balance assessment: Needs assistance Sitting-balance support: Feet supported Sitting balance-Leahy Scale: Fair                                     ADL either performed or assessed with clinical judgement   ADL Overall ADL's : Needs assistance/impaired                     Lower Body Dressing: Maximal assistance;Bed level Lower Body Dressing Details (indicate cue type and reason): for wrap around brief mgmt               General ADL Comments: Limited by L sided pain that increased with bed mobility to sit EOB. Pt reports not due for any other pain meds and reports oxy is only thing that helps with pain at home, along with rest (provided heat pack to trial on L hip)    Extremity/Trunk Assessment Upper Extremity Assessment Upper Extremity Assessment: Generalized weakness   Lower Extremity Assessment Lower Extremity Assessment: Defer to PT evaluation        Vision   Vision Assessment?: No apparent visual deficits   Perception     Praxis      Cognition Arousal/Alertness: Awake/alert Behavior During Therapy: WFL for tasks assessed/performed;Flat affect Overall Cognitive Status: Within Functional  Limits for tasks assessed                                 General Comments: Per chart, has h/o dementia though follows directions well          Exercises     Shoulder Instructions       General Comments HR 105bpm    Pertinent Vitals/ Pain       Pain Assessment: Faces Faces Pain Scale: Hurts whole lot Pain Location: L hip, stomach and back Pain Descriptors / Indicators: Grimacing;Sore;Discomfort Pain Intervention(s): Monitored during session;Limited activity within patient's tolerance;Patient requesting pain meds-RN notified;Heat  applied (heat to L hip (avoided heat to swollen part of abdomen))  Home Living                                          Prior Functioning/Environment              Frequency  Min 2X/week        Progress Toward Goals  OT Goals(current goals can now be found in the care plan section)  Progress towards OT goals: OT to reassess next treatment  Acute Rehab OT Goals Patient Stated Goal: decrease pain OT Goal Formulation: With patient Time For Goal Achievement: 10/17/21 Potential to Achieve Goals: Good ADL Goals Pt Will Perform Grooming: with modified independence;standing Pt Will Perform Lower Body Bathing: with modified independence;sit to/from stand Pt Will Perform Lower Body Dressing: with modified independence;sit to/from stand Pt Will Transfer to Toilet: with modified independence;regular height toilet;ambulating Pt Will Perform Toileting - Clothing Manipulation and hygiene: with modified independence;sit to/from stand  Plan Discharge plan remains appropriate    Co-evaluation                 AM-PAC OT "6 Clicks" Daily Activity     Outcome Measure   Help from another person eating meals?: A Little Help from another person taking care of personal grooming?: A Little Help from another person toileting, which includes using toliet, bedpan, or urinal?: A Little Help from another person bathing (including washing, rinsing, drying)?: A Little Help from another person to put on and taking off regular upper body clothing?: A Little Help from another person to put on and taking off regular lower body clothing?: A Lot 6 Click Score: 17    End of Session    OT Visit Diagnosis: Unsteadiness on feet (R26.81);Muscle weakness (generalized) (M62.81);Pain   Activity Tolerance Patient limited by pain   Patient Left in bed;with call bell/phone within reach;with bed alarm set   Nurse Communication Mobility status;Patient requests pain meds         Time: 6759-1638 OT Time Calculation (min): 19 min  Charges: OT General Charges $OT Visit: 1 Visit OT Treatments $Therapeutic Activity: 8-22 mins  Alyssa [name], OTR/L Acute Rehab Services Office: [phone]   Alyssa [name] 10/05/2021, 10:04 AM

## 2021-10-05 NOTE — Progress Notes (Signed)
PROGRESS NOTE  CHERAY PARDI TIR:[id number] DOB: 1941-05-17 DOA: 09/26/2021 PCP: System, Provider Not In  Brief History   80 year old female with a history of hypertension, hyperlipidemia, PVD, stage IV CKD, COPD, dementia, who was recently discharged from the hospital on 09/17/2021 after she was treated for hypertensive emergency, came to ED with complaints of 1 week history of increasing fatigue, mild Exertion, lightheadedness.  In the ED she was found to have heart rate of 30, creatinine 4.4 and potassium 6.9.  Patient was admitted under CCM service and cardiology was consulted. She has been transferred to step down status. This morning she received another dose of lokelma. She remains hyperkalemic at 5.5 and bradycardic. She is not symptomatic. Monitor potassium. [name] has been stopped and nephrology consulted. Dr. Justin Mend has recommended checking chemistry without tourniquet. K up to 5.5 today.  The patient states that she is feeling a little better. She has had a BM. However, her potassium continues to be elevated off of lokelma. Last step is to determine how to control the patient's electrolytes as outpatient.   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   Hydralazine has been started to address hypertension. Blood pressure is improved.  It has become clear that without the IV infusion of NS, the patient become oliguric due to her very low PO intake of fluids and water. The patient has been restarted on IV NS at 100 cc/hr, she has been placed on a calorie count, and I have exhorted her to increase her PO intake of both fluids and food. Will continue IV fluids through today. Stop in the am to judge whether or not the patient's intake is sufficient for discharge.  I have discussed this with her son, [name]. He is in agreement with hospice, if she isn't able to increase her PO intake.  Consultants  Nephrology Cardiology  Procedures   None  Antibiotics   Anti-infectives (From admission, onward)    Start     Dose/Rate Route Frequency Ordered Stop   10/01/21 0600  cefUROXime (ZINACEF) 1.5 g in sodium chloride 0.9 % 100 mL IVPB        1.5 g 200 mL/hr over 30 Minutes Intravenous On call to O.R. 09/30/21 0815 10/02/21 0559      Subjective  The patient is resting comfortably. No new complaints.  Objective   Vitals:  Vitals:   10/05/21 0750 10/05/21 1105  BP: 127/74 127/79  Pulse: (!) 105 100  Resp: 20 16  Temp: 98 F (36.7 C) 98 F (36.7 C)  SpO2: 96% 98%    Exam:  Constitutional:  The patient is awake, alert, and oriented x 3. No acute distress. Respiratory:  No increased work of breathing. No wheezes, rales, or rhonchi No tactile fremitus Cardiovascular:  Regular rate and rhythm No murmurs, ectopy, or gallups. No lateral PMI. No thrills. Abdomen:  Abdomen is soft, non-tender, non-distended No hernias, masses, or organomegaly Normoactive bowel sounds.  Musculoskeletal:  No cyanosis, clubbing, or edema Skin:  No rashes, lesions, ulcers palpation of skin: no induration or nodules Neurologic:  CN 2-12 intact Sensation all 4 extremities intact Psychiatric:  Mental status Mood, affect appropriate Orientation to person, place, time  judgment and insight appear intact  I have personally reviewed the following:   Today's Data  Vitals  Lab Data  BMP CBC Glucoses  Micro Data  MRSA screen negative  Imaging  CXR  Cardiology Data  EKG  Scheduled Meds:  amLODipine  10 mg Oral Daily   feeding supplement (NEPRO CARB STEADY)  237 mL Oral BID BM   heparin  5,000 Units Subcutaneous Q8H   hydrALAZINE  50 mg Oral Q8H   lidocaine  1 patch Transdermal Q24H   multivitamin  1 tablet Oral QHS   pantoprazole  40 mg Oral QHS   polyethylene glycol  17 g Oral BID   pregabalin  75 mg Oral Daily   sodium bicarbonate  1,300 mg Oral TID   Continuous Infusions:  sodium chloride     sodium  chloride 100 mL/hr at 10/05/21 1309    Problem  Complications Due to Renal Dialysis Device, Implant, and Graft      LOS: 9 days   A & P  Acute kidney injury on CKD stage IV -Patient baseline creatinine is around 2.75-2.85 -Presented with creatinine of 4.4 -Today creatinine has improved to 2.98. -Nephrology has been consulted.    Hyperkalemia -Patient presented with a potassium of 6.9, back up to 6.4 on 09/29/2021. She has received Lokelma x 2, sodium bicarb, albuterol, calcium gluconate. 54.5 currently. Continue to monitor and treat. Patient is no longer bradycardic.Dr. Justin Mend has recommended getting a chemistry drawn without a tourniquet in order to more accurately to evaluate her serum potassium status and to determine how best to control her potassium as outpatient.  Anorexia/Adipsia - Currently and prior to this admissison the patient did not drink or eat enough to maintain her renal or electrolyte health or nutritional status. This became apparent yesterday when the patient became oliguric and hyperkalemia after IV fluids were stopped 2 days before. I placed her back on IV NS at 100 cc/hr. She has been placed on a calorie count, and I have exhorted her to increase her PO intake of both fluids and food. Will continue IV fluids through today. Stop in the am to judge whether or not the patient's intake is sufficient for discharge. I have discussed the patient with her son, [name]. If she is not able to support herself with oral intake, she will be appropriate for hospice. Palliative care has been consulted.   Left upper quadrant pain -Patient had extensive work-up during previous admission at that time CT abdomen pelvis did not show significant abnormality -Patient is constipated -We will give Dulcolax suppository 10 mg x 1 -Also start MiraLAX 17 g p.o. twice daily -BM x 2 on 09/28/2021 BM on 09/30/2021. Discomfort relieved.   Bradycardia -Resolved.  -Ongoing as is hypokalemia. Last  dose of Lokelma at 0400 am. Potassium is currently 4.5. -Likely due to medications and hyperkalemia -Coreg is currently on hold -Cardiology considering PYP scan to exclude ATTR cardiac amyloid as outpatient   Hypertension -Blood pressure is improved after increase in hydralazine dose. -Coreg on hold due to bradycardia -Hydralazine has been increased to 50 mg q8 in addition to amlodipine. 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 Repair as outpatient when acute issues are resolved.  I have seen and examined this patient myself. I have spent 38 minutes in her evaluation and care.  DVT prophylaxis: Heparin Code Status: DNR Family Communication: I have discussed the  patient in detail with her son, [name]. All questions answered to the best of my ability. Disposition Plan: Home   Riggs Dineen, DO [facility] Direct contact: see [email or web address]  7PM-7AM contact night coverage as above 10/05/2021, 2:03 PM  LOS: 3 days d

## 2021-10-05 NOTE — Care Management Important Message (Signed)
Important Message  Patient Details  Name: Alyssa [name] MRN: [id number] Date of Birth: Aug 29, 1941   Medicare Important Message Given:  Yes     Orbie Pyo 10/05/2021, 2:59 PM

## 2021-10-05 NOTE — Progress Notes (Addendum)
Calorie Count Note  MD has ordered 48 hour calorie count.  Diet: 2 gram sodium diet with thin liquids.  Supplements: Nepro Shake po BID, each supplement provides 425 kcal and 19 grams protein  Breakfast: 350 kcal, 2 grams of protein Lunch: 255 kcal, 7 grams of protein Dinner: 363 kcal, 11 grams of protein Supplements: 850 kcal, 38 grams of protein.  Total intake: 1818 kcal (100% of kcal needs)  58 grams of protein (83% of protein needs)  Estimated Nutritional Needs:  Kcal:  1600-1800 kcals Protein:  70-80 g Fluid:  >/= 1.6 L  Meal completion has been mostly 60-100%. Pt currently has Nepro shake ordered and has been consuming them. RD to continue with current orders to aid in caloric and protein needs. RD to discontinue calorie count as intake adequate.   Nutrition Dx:  Severe Malnutrition related to chronic illness (dementia, CKD IV) as evidenced by severe fat depletion, severe muscle depletion; ongoing  Goal: Patient will meet greater than or equal to 90% of their needs  Intervention:  Continue Nepro Shake po BID, each supplement provides 425 kcal and 19 grams protein Renal MVI daily  Corrin Parker, MS, RD, LDN RD pager number/after hours weekend pager number on [place].

## 2021-10-06 DIAGNOSIS — T82898A Other specified complication of vascular prosthetic devices, implants and grafts, initial encounter: Secondary | ICD-10-CM | POA: Diagnosis not present

## 2021-10-06 LAB — POTASSIUM
Potassium: 5.2 mmol/L — ABNORMAL HIGH (ref 3.5–5.1)
Potassium: 5.3 mmol/L — ABNORMAL HIGH (ref 3.5–5.1)

## 2021-10-06 NOTE — Progress Notes (Signed)
Physical Therapy Treatment Patient Details Name: Alyssa [name] MRN: [id number] DOB: [birth year] Today's Date: 10/06/2021   History of Present Illness The pt is a 80 yo female presenting 12/10 with bradycardia in the 30s and fatigue. Pt found to be hyperkalemic, and also with R femoral aneurysm. Initially planned for repair 12/15, but not medically stable for OR at that time. Of note, pt with recent hospitalization for hypertensive crisis, SNF recommended but pt d/c home instead on 12/1. 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    PT Comments    Pt with good progress with mobility. Should be able to return home with family assist at DC.    Recommendations for follow up therapy are one component of a multi-disciplinary discharge planning process, led by the attending physician.  Recommendations may be updated based on patient status, additional functional criteria and insurance authorization.  Follow Up Recommendations  Home health PT     Assistance Recommended at Discharge Frequent or constant Supervision/Assistance  Equipment Recommendations  None recommended by PT    Recommendations for Other Services       Precautions / Restrictions Precautions Precautions: Fall Precaution Comments: Watch HR     Mobility  Bed Mobility Overal bed mobility: Needs Assistance Bed Mobility: Supine to Sit     Supine to sit: Min assist     General bed mobility comments: assist to elevate trunk into sitting    Transfers Overall transfer level: Needs assistance Equipment used: Rollator (4 wheels) Transfers: Sit to/from Stand Sit to Stand: Min guard           General transfer comment: assist for safety and lines    Ambulation/Gait Ambulation/Gait assistance: Min guard Gait Distance (Feet): 175 Feet Assistive device: Rollator (4 wheels) Gait Pattern/deviations: Step-through pattern;Trendelenburg;Trunk flexed Gait velocity: decr Gait velocity  interpretation: <1.31 ft/sec, indicative of household ambulator   General Gait Details: Pt with leg length discrepancy and doesn't have built up shoe here. Assist for safety and lines.   Stairs             Wheelchair Mobility    Modified Rankin (Stroke Patients Only)       Balance Overall balance assessment: Needs assistance Sitting-balance support: Feet supported;No upper extremity supported Sitting balance-Leahy Scale: Fair     Standing balance support: Bilateral upper extremity supported;Reliant on assistive device for balance Standing balance-Leahy Scale: Poor Standing balance comment: rollator and min guard for static standing                            Cognition Arousal/Alertness: Awake/alert Behavior During Therapy: WFL for tasks assessed/performed Overall Cognitive Status: No family/caregiver present to determine baseline cognitive functioning                                          Exercises      General Comments General comments (skin integrity, edema, etc.): HR to 142 with activity      Pertinent Vitals/Pain Pain Assessment: No/denies pain    Home Living                          Prior Function            PT Goals (current goals can now be found in the care plan section) Acute Rehab  PT Goals Patient Stated Goal: return home Progress towards PT goals: Progressing toward goals    Frequency           PT Plan Current plan remains appropriate    Co-evaluation              AM-PAC PT "6 Clicks" Mobility   Outcome Measure  Help needed turning from your back to your side while in a flat bed without using bedrails?: A Little Help needed moving from lying on your back to sitting on the side of a flat bed without using bedrails?: A Little Help needed moving to and from a bed to a chair (including a wheelchair)?: A Little Help needed standing up from a chair using your arms (e.g., wheelchair or  bedside chair)?: A Little Help needed to walk in hospital room?: A Little Help needed climbing 3-5 steps with a railing? : A Little 6 Click Score: 18    End of Session Equipment Utilized During Treatment: Gait belt Activity Tolerance: Patient tolerated treatment well Patient left: in chair;with call bell/phone within reach;with chair alarm set Nurse Communication: Mobility status PT Visit Diagnosis: Muscle weakness (generalized) (M62.81);Difficulty in walking, not elsewhere classified (R26.2)     Time: 1030-1051 PT Time Calculation (min) (ACUTE ONLY): 21 min  Charges:  $Gait Training: 8-22 mins                     [place] Pager [phone] Office [place] 10/06/2021, 12:32 PM

## 2021-10-06 NOTE — Plan of Care (Signed)
  Problem: Clinical Measurements: Goal: Ability to maintain clinical measurements within normal limits will improve Outcome: Not Progressing Goal: Diagnostic test results will improve Outcome: Not Progressing   

## 2021-10-06 NOTE — Consult Note (Signed)
Consultation Note Date: 10/06/2021   [name]  DOB: Jan 27, 1941  MRN: [id number]  Age / Sex: 80 y.o., female  PCP: System, Provider Not In Referring Physician: Dwyane Dee, MD  Reason for Consultation: Establishing goals of care and Psychosocial/spiritual support  HPI/Patient Profile: 80 y.o. female   admitted on 09/26/2021 with past medical history significant of HTN; HLD; PVD; stage 4 CKD; COPD; dementia and prior prolonged admission from 8/29-9/23 acute respiratory failure associated with LLL collapse with COPD and active herpes zoster infection.      She reports 2-3 days of mostly SOB with mild left-sided chest discomfort.  Maybe fever, abdominal pain.     This is her third rehospitalization in the last 5 months..  She reports continued slow physical and functional decline.  Admitted for treatment and stabilization.   Patient and family face treatment option decisions, advanced directive decisions and anticipatory care needs.        Clinical Assessment and Goals of Care:  This NP Wadie Lessen reviewed medical records, received report from team, assessed the patient and then meet at the patient's bedside along with her [name] and her sister Neoma Laming to discuss diagnosis, prognosis, GOC, EOL wishes disposition and options.  Concept of Palliative Care was introduced as specialized medical care for people and their families living with serious illness.  If focuses on providing relief from the symptoms and stress of a serious illness.  The goal is to improve quality of life for both the patient and the family.  Values and goals of care important to patient and family were attempted to be elicited.  This nurse practitioner worked extensively with this family several months ago as patient's son/Keith died here in the hospital.  Education offered to patient regarding the  seriousness of her current medical situation secondary to her multiple comorbidities specifically her stage IV kidney disease, and the likelihood that she would be a poor outpatient hemodialysis patient.  A  discussion was had today regarding advanced directives.  Concepts specific to code status, artifical feeding and hydration, continued IV antibiotics and rehospitalization was had.  The difference between a aggressive medical intervention path  and a palliative comfort care path for this patient at this time was had.    Created space and opportunity for patient  and family to explore thoughts and feelings regarding current medical situation.  Patient verbalizes her hope for continued life, she does speak to the importance of quality and independence.  Education offered on the concept specific to adult failure to thrive and the natural trajectory of a person slowly declining secondary to multiple comorbidities, and the limitations of medical interventions to prolong quality of life.  Patient and family understand this   MOST form completed    Questions and concerns addressed.  Patient  encouraged to call with questions or concerns.     PMT will continue to support holistically.   Patient and family will need ongoing support in navigating healthcare decisions moving forward.    Son  Kingsley Herandez is the main support person and decision maker.  There is no documented healthcare power of attorney at this time        SUMMARY OF RECOMMENDATIONS     Code Status/Advance Care Planning: DNR   Symptom Management:  Tylenol 1000 mg p.o. every 8 hours for underlying chronic pain  Palliative Prophylaxis:  Aspiration, Bowel Regimen, Delirium Protocol, and Frequent Pain Assessment  Additional Recommendations (Limitations, Scope, Preferences): Full Scope Treatment  Psycho-social/Spiritual:  Desire for further Chaplaincy support:yes Additional Recommendations: Education on  Hospice  Prognosis:  Unable to determine  Discharge Planning:  Patient is hopeful for SNF for short-term rehab.  Patient and family currently in the process of figuring out anticipatory care needs into the future.  Patient does have in-home aides to I believe to be caps program.  She gets approximately 3 hours a day 5 days a week.  Family is going to look into securing more hours    To Be Determined     Primary Diagnoses: Present on Admission:  Acute renal failure superimposed on chronic kidney disease (HCC)  Hyperkalemia  Essential hypertension  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, implant, and graft  Bradycardia with 31-40 beats per minute   I have reviewed the medical record, interviewed the patient and family, and examined the patient. The following aspects are pertinent.  Past Medical History:  Diagnosis Date   Acute respiratory failure with hypoxia ([place]) 11/01/2019   Anxiety    Arthritis    Chest pain    Chronic kidney disease    Dyspnea    Elevated troponin 12/13/2018   Headache(784.0)    Hyperlipidemia    Hypertension    Joint pain    Leg pain    Peripheral neuropathy    Peripheral vascular disease ([place])    Syncope 09/2020   Social History   Socioeconomic History   Marital status: Single    Spouse name: Not on file   Number of children: 2   Years of education: 11th   Highest education level: Not on file  Occupational History   Occupation: Retired  Tobacco Use   Smoking status: Former    Packs/day: 1.00    Years: 50.00    Pack years: 50.00    Types: Cigarettes    Quit date: 04/22/2021    Years since quitting: 0.4   Smokeless tobacco: Never   Tobacco comments:    Quit 3 months ago 07/23/21 [facility]  Vaping Use   Vaping Use: Never used  Substance and Sexual Activity   Alcohol use: No   Drug use: No   Sexual activity: Not Currently  Other Topics Concern   Not on file  Social History Narrative    Lives at home with her brother.    Right-handed.   Occasional caffeine use.   Social Determinants of Health   Financial Resource Strain: Not on file  Food Insecurity: Not on file  Transportation Needs: Not on file  Physical Activity: Not on file  Stress: Not on file  Social Connections: Not on file   Family History  Problem Relation Age of Onset   Heart attack Mother    Heart disease Mother    Hyperlipidemia Mother    Hypertension Mother    Heart attack Father    Heart disease Father        Before age 64   Hyperlipidemia Father    Hypertension Father    Cancer Sister  brain tumor   Aneurysm Brother        brain   Colon cancer Neg Hx    Liver cancer Neg Hx    Scheduled Meds:  amLODipine  10 mg Oral Daily   feeding supplement (NEPRO CARB STEADY)  237 mL Oral BID BM   heparin  5,000 Units Subcutaneous Q8H   hydrALAZINE  50 mg Oral Q8H   lidocaine  1 patch Transdermal Q24H   multivitamin  1 tablet Oral QHS   pantoprazole  40 mg Oral QHS   polyethylene glycol  17 g Oral BID   pregabalin  75 mg Oral Daily   sodium bicarbonate  1,300 mg Oral TID   Continuous Infusions:  sodium chloride     PRN Meds:.acetaminophen, diclofenac Sodium, docusate sodium, hydrALAZINE, ondansetron (ZOFRAN) IV, oxyCODONE Medications Prior to Admission:  Prior to Admission medications   Medication Sig Start Date End Date Taking? Authorizing Provider  acetaminophen (TYLENOL) 650 MG CR tablet Take 650 mg by mouth daily.   Yes [provider]  albuterol (VENTOLIN HFA) 108 (90 Base) MCG/ACT inhaler Inhale 2 puffs into the lungs every 4 (four) hours as needed for wheezing or shortness of breath.   Yes [provider]  amitriptyline (ELAVIL) 10 MG tablet Take 10 mg by mouth daily.   Yes [provider]  amLODipine (NORVASC) 10 MG tablet Take 1 tablet (10 mg total) by mouth daily. 09/18/21  Yes Little Ishikawa, MD  aspirin 81 MG EC tablet Take 1 tablet (81 mg total) by mouth daily. 01/09/19  Yes  Gherghe, Vella Redhead, MD  Budeson-Glycopyrrol-Formoterol (BREZTRI AEROSPHERE) 160-9-4.8 MCG/ACT AERO Inhale 2 puffs into the lungs 2 (two) times daily. 07/23/21  Yes Tanda Rockers, MD  carvedilol (COREG) 12.5 MG tablet Take 1 tablet (12.5 mg total) by mouth 2 (two) times daily. 09/17/21  Yes Little Ishikawa, MD  cloNIDine (CATAPRES) 0.1 MG tablet Take 1 tablet (0.1 mg total) by mouth 2 (two) times daily. 09/17/21 11/16/21 Yes Little Ishikawa, MD  clopidogrel (PLAVIX) 75 MG tablet Take 1 tablet (75 mg total) by mouth daily. 01/09/19  Yes Caren Griffins, MD  diclofenac Sodium (VOLTAREN) 1 % GEL Apply 2 g topically every 6 (six) hours as needed (knee pain). 01/15/20  Yes [provider]  dicyclomine (BENTYL) 20 MG tablet Take 20 mg by mouth 3 (three) times daily. 04/01/20  Yes [provider]  docusate sodium (COLACE) 100 MG capsule Take 1 capsule (100 mg total) by mouth daily. 07/11/21  Yes Kc, Maren Beach, MD  donepezil (ARICEPT) 5 MG tablet Take 1 tablet (5 mg total) by mouth daily. 11/20/20  Yes Cameron Sprang, MD  Ferrous Sulfate (IRON HIGH-POTENCY) 325 MG TABS Take 325 mg by mouth daily with breakfast. Patient taking differently: Take 325 mg by mouth daily. 06/30/19  Yes Hongalgi, Lenis Dickinson, MD  hydrOXYzine (ATARAX/VISTARIL) 25 MG tablet Take 25 mg by mouth at bedtime. 05/12/20  Yes [provider]  lidocaine (LIDODERM) 5 % Place 1 patch onto the skin daily. 08/31/21  Yes [provider]  magnesium oxide (MAG-OX) 400 MG tablet Take 400 mg by mouth 2 (two) times daily.   Yes [provider]  megestrol (MEGACE) 40 MG tablet Take 40 mg by mouth 2 (two) times daily. 05/12/20  Yes [provider]  methocarbamol (ROBAXIN) 500 MG tablet Take 500 mg by mouth 3 (three) times daily.   Yes [provider]  Multiple Vitamins-Minerals (HAIR/SKIN/NAILS) TABS Take  1 tablet by mouth daily.   Yes [provider]  nitroGLYCERIN (NITROSTAT) 0.4 MG SL  tablet Place 1 tablet (0.4 mg total) under the tongue every 5 (five) minutes as needed for chest pain. 01/07/20  Yes Allie Bossier, MD  pantoprazole (PROTONIX) 40 MG tablet Take 1 tablet (40 mg total) by mouth daily. 06/16/19  Yes Hongalgi, Lenis Dickinson, MD  pregabalin (LYRICA) 75 MG capsule Take 1 capsule (75 mg total) by mouth daily. 07/11/21 11/17/21 Yes Antonieta Pert, MD  sodium bicarbonate 650 MG tablet Take 2 tablets (1,300 mg total) by mouth 2 (two) times daily. 01/07/20  Yes Allie Bossier, MD  valproic acid (DEPAKENE) 250 MG capsule Take 250 mg by mouth at bedtime.   Yes [provider]  Ensure Plus (ENSURE PLUS) LIQD Take 237 mLs by mouth daily.    [provider]   No Known Allergies Review of Systems  Neurological:  Positive for weakness.   Physical Exam Cardiovascular:     Rate and Rhythm: Normal rate.  Skin:    General: Skin is warm and dry.  Neurological:     Mental Status: She is alert and oriented to person, place, and time.    Vital Signs: BP 140/74    Pulse (!) 104    Temp 97.8 F (36.6 C) (Oral)    Resp 15    Ht 5\' 3"  (1.6 m)    Wt 44.6 kg    SpO2 98%    BMI 17.42 kg/m  Pain Scale: 0-10 POSS *See Group Information*: S-Acceptable,Sleep, easy to arouse Pain Score: 6    SpO2: SpO2: 98 % O2 Device:SpO2: 98 % O2 Flow Rate: .O2 Flow Rate (L/min): 2 L/min  IO: Intake/output summary:  Intake/Output Summary (Last 24 hours) at 10/06/2021 1232 Last data filed at 10/06/2021 1226 Gross per 24 hour  Intake 2873.31 ml  Output 700 ml  Net 2173.31 ml    LBM: Last BM Date: 09/30/21 Baseline Weight: Weight: 48.3 kg Most recent weight: Weight: 44.6 kg     Palliative Assessment/Data:  40%   Discussed with bedside RN and TOC team   Time In:  1330 Time Out: 1445 Time Total: 75 minutes Greater than 50%  of this time was spent counseling and coordinating care related to the above assessment and plan.  Signed by: Wadie Lessen, NP   Please contact Palliative  Medicine Team phone at [phone] for questions and concerns.  For individual provider: See Shea Evans

## 2021-10-06 NOTE — TOC Progression Note (Signed)
Transition of Care [facility]) - Progression Note    Patient Details  Name: Alyssa [name] MRN: [id number] Date of Birth: 01/15/1941  Transition of Care [facility]) CM/SW Contact  Zenon Mayo, RN Phone Number: 10/06/2021, 3:37 PM  Clinical Narrative:    Cret is still a little elevated, hyperkalemia, patient has poor po intace,  was on NS  IVF's yesterday, and calorie count, Palliative consulted, Palliative to meet again with family tomorrow at 1:30.  Patient is active with [facility] for [place], [place], [place], [place], and SW.  TOC will continue to follow for dc needs.        Expected Discharge Plan and Services                                                 Social Determinants of Health (SDOH) Interventions    Readmission Risk Interventions Readmission Risk Prevention Plan 07/10/2020  Transportation Screening Complete  Medication Review ([place]) Complete  HRI or [place] Complete  SW Recovery Care/Counseling Consult Complete  Palliative Care Screening Not [place] Not Applicable  Some recent data might be hidden

## 2021-10-06 NOTE — Progress Notes (Signed)
Progress Note    Alyssa [name]   CZY:[id number]  DOB: 09-22-1941  DOA: 09/26/2021     10 PCP: System, Provider Not In  Initial CC: Fatigue, lightheadedness  Hospital Course:  80 year old female with a history of hypertension, hyperlipidemia, PVD, stage IV CKD, COPD, dementia, who was recently discharged from the hospital on 09/17/2021 after she was treated for hypertensive emergency, came to ED with complaints of 1 week history of increasing fatigue and lightheadedness.  In the ED she was found to have heart rate of 30, creatinine 4.4 and potassium 6.9.  Patient was admitted under CCM service and cardiology was consulted. She was treated for hyperkalemia and K improved.  On work-up she was also found to have a right femoral pseudoaneurysm and was to undergo repair inpatient however due to electrolyte abnormalities, this was scheduled to be evaluated further outpatient by vascular surgery.  Interval History:  No events overnight.  Resting in bed comfortably when seen this morning.  Assessment & Plan:  Acute kidney injury on CKD stage IV -Patient baseline creatinine is around 2.75-2.85 -Presented with creatinine of 4.4 -Nephrology has been consulted - no lab this am but creat overall had improved some but higher than a few days ago - repeat BMP in am   Hyperkalemia -Patient presented with a potassium of 6.9, back up to 6.4 on 09/29/2021. She has received Lokelma x 2, sodium bicarb, albuterol, calcium gluconate.  - bradycardia has resolved  - follow up BMP in am  Anorexia/Adipsia  - Currently and prior to this admissison the patient did not drink or eat enough to maintain her renal or electrolyte health or nutritional status.  -Continue fluids - Prior discussion has been held with her son regarding possible transition to hospice if she is unable to keep up with adequate oral intake  Left upper quadrant pain - resolved  -Patient had extensive work-up during previous  admission at that time CT abdomen pelvis did not show significant abnormality -Patient is constipated; continue bowel regimen   Bradycardia - resolved - coreg on hold  - Cardiology considering PYP scan to exclude ATTR cardiac amyloid as outpatient   Hypertension - BMP improving  -Continue amlodipine - Coreg remains on hold   Pseudoaneurysm versus hematoma -Incidental finding on CT abdomen/pelvis during previous admission -CT scan showed 5.5 x 4.8 cm lesion in the right groin apparently incorporating the graft anastomosis -Imaging suggest hematoma versus pseudoaneurysm -Patient was seen by vascular surgery in September 2022 for the same and was supposed to follow-up with vascular surgery as outpatient for repair of pseudoaneurysm -Vascular surgical following. Repair as outpatient when acute issues are resolved.    Old records reviewed in assessment of this patient  Antimicrobials:   DVT prophylaxis: HSQ  Code Status:   Code Status: DNR  Disposition Plan:   Status is: Inpt  Objective: Blood pressure 140/74, pulse (!) 104, temperature 97.8 F (36.6 C), temperature source Oral, resp. rate 15, height 5\' 3"  (1.6 m), weight 44.6 kg, SpO2 98 %.  Examination:  Physical Exam Constitutional:      Appearance: Normal appearance.  HENT:     Head: Normocephalic and atraumatic.     Mouth/Throat:     Mouth: Mucous membranes are moist.  Eyes:     Extraocular Movements: Extraocular movements intact.  Cardiovascular:     Rate and Rhythm: Normal rate and regular rhythm.     Heart sounds: Murmur heard.     Comments: 3/6 HSM noted in USBs Pulmonary:  Effort: Pulmonary effort is normal.     Breath sounds: Normal breath sounds.  Abdominal:     General: Bowel sounds are normal. There is no distension.     Palpations: Abdomen is soft.     Tenderness: There is no abdominal tenderness.  Musculoskeletal:        General: Normal range of motion.     Cervical back: Normal range of motion  and neck supple.  Skin:    General: Skin is warm and dry.  Neurological:     General: No focal deficit present.     Mental Status: She is alert.  Psychiatric:        Mood and Affect: Mood normal.     Consultants:  Cardiology Vascular surgery  Procedures:    Data Reviewed: I have personally reviewed labs and imaging studies    LOS: 10 days  Time spent: Greater than 50% of the 35 minute visit was spent in counseling/coordination of care for the patient as laid out in the A&P.   Dwyane Dee, MD [facility] 10/06/2021, 1:30 PM

## 2021-10-06 NOTE — Progress Notes (Addendum)
Nutrition Follow-up  DOCUMENTATION CODES:   Severe malnutrition in context of chronic illness  INTERVENTION:   - Calorie count completed; please see calorie count note from 10/05/21  - Continue Nepro Shake po BID, each supplement provides 425 kcal and 19 grams protein  - Continue renal MVI daily  - Encourage PO intake  NUTRITION DIAGNOSIS:   Moderate Malnutrition related to chronic illness (dementia, CKD IV) as evidenced by severe fat depletion, severe muscle depletion.  Ongoing, being addressed via oral nutrition supplements  GOAL:   Patient will meet greater than or equal to 90% of their needs  Progressing  MONITOR:   PO intake, Supplement acceptance, Weight trends, Labs  REASON FOR ASSESSMENT:   Malnutrition Screening Tool    ASSESSMENT:   80 yo female admitted with AKI on CKD IV with hyperkalemia, bradycardia. PMH includes CKD IV, HTN, HLD, PVD, dementia  Calorie count completed 12/19. Pt found to be meeting 100% of calorie needs and 83% of protein needs.  Spoke with RN prior to meeting with pt. RN reports calorie count has been completed. Explained results of calorie count. RN reports pt was eating fairly well at breakfast this morning at shift change.  Spoke with pt who was in recliner. Pt reports appetite improving. Pt reports consuming 100% of [place] toast and 50% of cereal with milk this morning for breakfast. Pt states that she has been consuming 100% of 2 Nepro supplements daily. Encouraged pt to continue to drink at least 2 Nepro supplements daily in addition to consuming food from meal trays.  Admit weight: 48.3 kg Current weight: 44.6 kg  Meal Completion: 20-100%  Medications reviewed and include: Nepro Shake BID, rena-vit, protonix, miralax, sodium bicarb 1300 mg TID  Labs reviewed: potassium 5.2, BUN 39, creatinine 3.10  UOP: 700 ml x 12 hours I/O's: +5.3 L since admit  Diet Order:   Diet Order             Diet 2 gram sodium Room service  appropriate? Yes; Fluid consistency: Thin  Diet effective now                   EDUCATION NEEDS:   Not appropriate for education at this time  Skin:  Skin Assessment: Reviewed RN Assessment  Last BM:  10/06/21 type 6  Height:   Ht Readings from Last 1 Encounters:  10/05/21 5\' 3"  (1.6 m)    Weight:   Wt Readings from Last 1 Encounters:  10/06/21 44.6 kg    BMI:  Body mass index is 17.42 kg/m.  Estimated Nutritional Needs:   Kcal:  1600-1800 kcals  Protein:  70-80 grams  Fluid:  >/= 1.6 L    Gustavus Bryant, MS, RD, LDN Inpatient Clinical Dietitian Please see AMiON for contact information.

## 2021-10-06 NOTE — Progress Notes (Signed)
°  Progress Note    10/06/2021 7:15 AM Hospital Day 9  Subjective:  denies pain in the right groin or right foot.   Vitals:   10/06/21 0247 10/06/21 0705  BP: (!) 143/89 137/80  Pulse: (!) 111 (!) 114  Resp: 16 15  Temp: 97.9 F (36.6 C) 97.8 F (36.6 C)  SpO2: 98% 99%    Physical Exam: General:  no distress; sitting in bed eating breakfast Lungs:  non labored Extremities:  right groin psa unchanged from previous exam.  Right foot is warm and well perfused.   CBC    Component Value Date/Time   WBC 9.6 10/04/2021 0521   RBC 3.14 (L) 10/04/2021 0521   HGB 9.0 (L) 10/04/2021 0521   HCT 26.6 (L) 10/04/2021 0521   PLT 404 (H) 10/04/2021 0521   MCV 84.7 10/04/2021 0521   MCH 28.7 10/04/2021 0521   MCHC 33.8 10/04/2021 0521   RDW 18.3 (H) 10/04/2021 0521   LYMPHSABS 3.0 10/04/2021 0521   MONOABS 1.1 (H) 10/04/2021 0521   EOSABS 0.4 10/04/2021 0521   BASOSABS 0.1 10/04/2021 0521    BMET    Component Value Date/Time   NA 135 10/05/2021 0504   K 5.2 (H) 10/06/2021 0519   CL 106 10/05/2021 0504   CO2 20 (L) 10/05/2021 0504   GLUCOSE 109 (H) 10/05/2021 0504   BUN 39 (H) 10/05/2021 0504   CREATININE 3.10 (H) 10/05/2021 0504   CALCIUM 9.1 10/05/2021 0504   GFRNONAA 15 (L) 10/05/2021 0504   GFRAA 30 (L) 07/10/2020 0137    INR    Component Value Date/Time   INR 1.2 07/06/2020 2246     Intake/Output Summary (Last 24 hours) at 10/06/2021 0715 Last data filed at 10/06/2021 0630 Gross per 24 hour  Intake 2393.31 ml  Output 700 ml  Net 1693.31 ml     Assessment/Plan:  80 y.o. 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 Oneida Alar in 2011 now with right femoral anastomotic psa  Hospital Day 9  -plan was to repair right femoral psa yesterday, however, given pt's medical issues, this was cancelled.  Upon exam today, the psa is stable and her right foot is warm and well perfused.  Dr. Trula Slade is out the rest of the week and will check back  on her next week if she is still here.  Please call with questions.   Leontine Locket, PA-C [facility] [phone] 10/06/2021 7:15 AM

## 2021-10-06 NOTE — Progress Notes (Signed)
This chaplain responded to PMT consult for Pt. prayer.  The Pt. is awake and sharing her pain is better after her pain medicine and nap.   Through reflective listening, the chaplain understands the Pt. desires to be more faithful in her prayer and lean on God's love in the decisions of what to do next. The chaplain understands the Pt. is thinking of life and death. The chaplain affirmed the Pt. prayer and the relationship she lovingly shares with her son. The Pt. accepted the chaplain's invitation to open the door of communication with her son in her decision making process.  The chaplain prayed with the Pt. and is available for F/U with spiritual care as needed.  Chaplain Sallyanne Kuster [phone]

## 2021-10-07 DIAGNOSIS — T829XXA Unspecified complication of cardiac and vascular prosthetic device, implant and graft, initial encounter: Secondary | ICD-10-CM

## 2021-10-07 DIAGNOSIS — Z66 Do not resuscitate: Secondary | ICD-10-CM

## 2021-10-07 DIAGNOSIS — R531 Weakness: Secondary | ICD-10-CM

## 2021-10-07 DIAGNOSIS — N170 Acute kidney failure with tubular necrosis: Secondary | ICD-10-CM | POA: Diagnosis not present

## 2021-10-07 DIAGNOSIS — R001 Bradycardia, unspecified: Secondary | ICD-10-CM | POA: Diagnosis not present

## 2021-10-07 DIAGNOSIS — I10 Essential (primary) hypertension: Secondary | ICD-10-CM | POA: Diagnosis not present

## 2021-10-07 DIAGNOSIS — Z515 Encounter for palliative care: Secondary | ICD-10-CM

## 2021-10-07 LAB — CBC WITH DIFFERENTIAL/PLATELET
Abs Immature Granulocytes: 0.01 10*3/uL (ref 0.00–0.07)
Basophils Absolute: 0.1 10*3/uL (ref 0.0–0.1)
Basophils Relative: 1 %
Eosinophils Absolute: 0.4 10*3/uL (ref 0.0–0.5)
Eosinophils Relative: 7 %
HCT: 25.3 % — ABNORMAL LOW (ref 36.0–46.0)
Hemoglobin: 8.2 g/dL — ABNORMAL LOW (ref 12.0–15.0)
Immature Granulocytes: 0 %
Lymphocytes Relative: 32 %
Lymphs Abs: 2 10*3/uL (ref 0.7–4.0)
MCH: 27.6 pg (ref 26.0–34.0)
MCHC: 32.4 g/dL (ref 30.0–36.0)
MCV: 85.2 fL (ref 80.0–100.0)
Monocytes Absolute: 1 10*3/uL (ref 0.1–1.0)
Monocytes Relative: 16 %
Neutro Abs: 2.8 10*3/uL (ref 1.7–7.7)
Neutrophils Relative %: 44 %
Platelets: 380 10*3/uL (ref 150–400)
RBC: 2.97 MIL/uL — ABNORMAL LOW (ref 3.87–5.11)
RDW: 18.3 % — ABNORMAL HIGH (ref 11.5–15.5)
WBC: 6.3 10*3/uL (ref 4.0–10.5)
nRBC: 0 % (ref 0.0–0.2)

## 2021-10-07 LAB — MAGNESIUM: Magnesium: 2.3 mg/dL (ref 1.7–2.4)

## 2021-10-07 LAB — BASIC METABOLIC PANEL
Anion gap: 6 (ref 5–15)
BUN: 32 mg/dL — ABNORMAL HIGH (ref 8–23)
CO2: 21 mmol/L — ABNORMAL LOW (ref 22–32)
Calcium: 9.4 mg/dL (ref 8.9–10.3)
Chloride: 107 mmol/L (ref 98–111)
Creatinine, Ser: 2.58 mg/dL — ABNORMAL HIGH (ref 0.44–1.00)
GFR, Estimated: 18 mL/min — ABNORMAL LOW (ref >60)
Glucose, Bld: 103 mg/dL — ABNORMAL HIGH (ref 70–99)
Potassium: 5.2 mmol/L — ABNORMAL HIGH (ref 3.5–5.1)
Sodium: 134 mmol/L — ABNORMAL LOW (ref 135–145)

## 2021-10-07 LAB — PHOSPHORUS: Phosphorus: 4.7 mg/dL — ABNORMAL HIGH (ref 2.5–4.6)

## 2021-10-07 LAB — POTASSIUM: Potassium: 5.9 mmol/L — ABNORMAL HIGH (ref 3.5–5.1)

## 2021-10-07 MED ORDER — ACETAMINOPHEN 500 MG PO TABS
1000.0000 mg | ORAL_TABLET | Freq: Three times a day (TID) | ORAL | Status: DC
  Administered 2021-10-07 – 2021-10-09 (×7): 1000 mg via ORAL
  Filled 2021-10-07 (×7): qty 2

## 2021-10-07 MED ORDER — SODIUM ZIRCONIUM CYCLOSILICATE 10 G PO PACK
10.0000 g | PACK | Freq: Two times a day (BID) | ORAL | Status: DC
  Administered 2021-10-07: 22:00:00 10 g via ORAL
  Filled 2021-10-07: qty 1

## 2021-10-07 MED ORDER — HYDROMORPHONE HCL 2 MG PO TABS
2.0000 mg | ORAL_TABLET | ORAL | Status: DC | PRN
Start: 2021-10-07 — End: 2021-10-09
  Administered 2021-10-07 – 2021-10-08 (×4): 2 mg via ORAL
  Filled 2021-10-07 (×4): qty 1

## 2021-10-07 MED ORDER — SODIUM ZIRCONIUM CYCLOSILICATE 5 G PO PACK
5.0000 g | PACK | Freq: Two times a day (BID) | ORAL | Status: DC
  Administered 2021-10-07: 12:00:00 5 g via ORAL
  Filled 2021-10-07 (×2): qty 1

## 2021-10-07 NOTE — TOC Progression Note (Addendum)
Transition of Care [facility]) - Progression Note    Patient Details  Name: Alyssa [name] MRN: [id number] Date of Birth: [birth year]  Transition [facility]) CM/SW Contact  Zenon Mayo, RN Phone Number: 10/07/2021, 4:47 PM  Clinical Narrative:    NCM spoke with Wadie Lessen with Palliative she states she spoke with son today , they filled out a most form, they want to keep treating patient and wants short term rehab. NCM informed CSW.  Stanton Kidney states she will follow up with son tomorrow.        Expected Discharge Plan and Services                                                 Social Determinants of Health (SDOH) Interventions    Readmission Risk Interventions Readmission Risk Prevention Plan 07/10/2020  Transportation Screening Complete  Medication Review ([place]) Complete  HRI or [place] Complete  SW Recovery Care/Counseling Consult Complete  Palliative Care Screening Not [place] Not Applicable  Some recent data might be hidden

## 2021-10-07 NOTE — Progress Notes (Signed)
PROGRESS NOTE  Alyssa [name]  YKZ:[id number] DOB: Nov 04, 1940 DOA: 09/26/2021 PCP: System, Provider Not In   Brief Narrative: 80 year old female with a history of hypertension, hyperlipidemia, PVD, stage IV CKD, COPD, dementia, who was recently discharged from the hospital on 09/17/2021 after she was treated for hypertensive emergency, came to ED with complaints of 1 week history of increasing fatigue and lightheadedness.  In the ED she was found to have heart rate of 30, creatinine 4.4 and potassium 6.9.  Patient was admitted under CCM service and cardiology was consulted. She was treated for hyperkalemia and K improved.  On work-up she was also found to have a right femoral pseudoaneurysm and was to undergo repair inpatient however due to electrolyte abnormalities, this was scheduled to be evaluated further outpatient by vascular surgery.  Assessment & Plan: Principal Problem:   Bradycardia with 31-40 beats per minute Active Problems:   Hyperkalemia   Acute renal failure superimposed on chronic kidney disease ([place])   Essential hypertension   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, implant, and graft  Hyperkalemia: Recurrent.  - Repeat lokelma x2 today, recheck in AM.  - Avoid ACE/ARB. - Low K diet recommended by nephrology.  Anemia of chronic disease, CKD, and malnutrition: Required 1u PRBCs during previous hospitalization.  - Monitor hgb in AM   AKI on stage IV CKD with NAGMA: SCr 4.4 on admission, since returned near baseline in high 2's. Follow up with Dr. Joelyn Oms. - Continue bicarbonate tabs - Do not believe hemodialysis would be beneficial on the whole for this patient. Palliative following.    Dementia:  - Hold aricept due to bradycardia.   Anorexia/Adipsia, moderate protein calorie malnutrition:  - PTA and continues. Palliative consulted. No plans to initiate artificial feeding, strongly recommend hospice care if not taking adequate per oral intake. Continue IVF  while monitoring.   POstherpetic neuralgia: Left lower back/abdomen. No active lesions.  - Continue lyrica. Switch oxycodone to oral dilaudid due to renal dysfunction.  - Lidoderm patch.   CAD:  - Continue aspirin, plavix. Holding BB as above. - F/u with cardiology as outpatient (work up limited by poor renal function).  Left upper quadrant pain: Resolved. Patient had extensive work-up during previous admission at that time CT abdomen pelvis did not show significant abnormality - Continue bowel regimen for constipation.   Sinus bradycardia: Resolved.  - Continue holding coreg.  - Cardiology considering PYP scan to exclude ATTR cardiac amyloid as outpatient   Hypertension:  - Continue to hold coreg, continue norvasc.   Pseudoaneurysm versus hematoma: 5.5 x 4.8 cm lesion in the right groin apparently incorporating the graft anastomosis incidentally noted on CT abd/pelvis during previous admission.  - Patient was seen by vascular surgery in September 2022 for the same and was supposed to follow-up with vascular surgery as outpatient for repair of pseudoaneurysm - Vascular surgical following. Current plan is to repair as outpatient when acute issues are resolved.  DVT prophylaxis: Heparin Code Status: DNR Family Communication: None at bedside Disposition Plan:  Status is: Inpatient  Remains inpatient appropriate because: Persistent metabolic abnormalities, hyperkalemia.   Consultants:  Nephrology Cardiology VVS Palliative care  Procedures:  None  Antimicrobials: None   Subjective: Tired, has pain in lower abdomen on left radiating from back which is waxing/waning worse with touching the area, and has been present ever since shingles resolved. No new complaints. Pain is not very well controlled.  Objective: Vitals:   10/07/21 0325 10/07/21 0728 10/07/21 1030 10/07/21 1600  BP: 128/80 126/76 119/69   Pulse: 99 (!) 103 (!) 105   Resp: 15 16 (!) 22   Temp: 98.1 F (36.7  C) 97.9 F (36.6 C) 97.9 F (36.6 C) 98.2 F (36.8 C)  TempSrc: Oral Oral Oral Oral  SpO2: 97% 98% 97%   Weight: 44.7 kg     Height:        Intake/Output Summary (Last 24 hours) at 10/07/2021 1817 Last data filed at 10/07/2021 1600 Gross per 24 hour  Intake 120 ml  Output 1700 ml  Net -1580 ml   Filed Weights   10/04/21 0500 10/06/21 0247 10/07/21 0325  Weight: 43.1 kg 44.6 kg 44.7 kg    Gen: Chronically ill-appearing female in no distress Pulm: Non-labored breathing room air. Clear to auscultation bilaterally.  CV: Regular rate and rhythm. No murmur, rub, or gallop. No JVD, no pitting pedal edema. GI: Abdomen soft, non-tender, non-distended, with normoactive bowel sounds. No organomegaly or masses felt. Ext: Warm, no deformities Skin: No rashes, lesions or ulcers on visualized skin Neuro: Drowsy but interactive, follows commands. No focal deficits.  Psych: Judgement and insight appear marginal. Mood & affect appropriate.   Data Reviewed: I have personally reviewed following labs and imaging studies  CBC: Recent Labs  Lab 10/01/21 0632 10/02/21 0105 10/03/21 0432 10/04/21 0521 10/07/21 0439  WBC 8.5 8.5 10.7* 9.6 6.3  NEUTROABS  --   --  5.9 5.0 2.8  HGB 11.1* 9.3* 9.8* 9.0* 8.2*  HCT 34.0* 28.7* 29.5* 26.6* 25.3*  MCV 85.0 86.4 84.5 84.7 85.2  PLT 443* 437* 357 404* 378   Basic Metabolic Panel: Recent Labs  Lab 10/03/21 0432 10/03/21 1055 10/03/21 1547 10/04/21 0521 10/04/21 1621 10/05/21 0504 10/05/21 1609 10/06/21 0519 10/06/21 1618 10/07/21 0439 10/07/21 1532  NA 132* 132*  --  135  --  135  --   --   --  134*  --   K 5.2* 5.1   < > 5.5*   < > 5.2* 5.4* 5.2* 5.3* 5.2* 5.9*  CL 103 104  --  105  --  106  --   --   --  107  --   CO2 17* 17*  --  21*  --  20*  --   --   --  21*  --   GLUCOSE 146* 143*  --  113*  --  109*  --   --   --  103*  --   BUN 42* 39*  --  41*  --  39*  --   --   --  32*  --   CREATININE 2.82* 2.90*  --  2.93*  --  3.10*   --   --   --  2.58*  --   CALCIUM 9.5 9.6  --  9.6  --  9.1  --   --   --  9.4  --   MG  --   --   --   --   --   --   --   --   --  2.3  --   PHOS  --   --   --   --   --   --   --   --   --  4.7*  --    < > = values in this interval not displayed.   GFR: Estimated Creatinine Clearance: 12.7 mL/min (A) (by C-G formula based on SCr of 2.58 mg/dL (H)). Liver Function Tests:  No results for input(s): AST, ALT, ALKPHOS, BILITOT, PROT, ALBUMIN in the last 168 hours. No results for input(s): LIPASE, AMYLASE in the last 168 hours. No results for input(s): AMMONIA in the last 168 hours. Coagulation Profile: No results for input(s): INR, PROTIME in the last 168 hours. Cardiac Enzymes: No results for input(s): CKTOTAL, CKMB, CKMBINDEX, TROPONINI in the last 168 hours. BNP (last 3 results) No results for input(s): PROBNP in the last 8760 hours. HbA1C: No results for input(s): HGBA1C in the last 72 hours. CBG: No results for input(s): GLUCAP in the last 168 hours. Lipid Profile: No results for input(s): CHOL, HDL, LDLCALC, TRIG, CHOLHDL, LDLDIRECT in the last 72 hours. Thyroid Function Tests: No results for input(s): TSH, T4TOTAL, FREET4, T3FREE, THYROIDAB in the last 72 hours. Anemia Panel: No results for input(s): VITAMINB12, FOLATE, FERRITIN, TIBC, IRON, RETICCTPCT in the last 72 hours. Urine analysis:    Component Value Date/Time   COLORURINE STRAW (A) 07/18/2021 1218   APPEARANCEUR CLEAR 07/18/2021 1218   LABSPEC 1.008 07/18/2021 1218   PHURINE 8.0 07/18/2021 [place] 07/18/2021 [place] 07/18/2021 [place] 07/18/2021 [place] 07/18/2021 1218   PROTEINUR 100 (A) 07/18/2021 1218   UROBILINOGEN 0.2 08/24/2015 1737   NITRITE NEGATIVE 07/18/2021 1218   LEUKOCYTESUR NEGATIVE 07/18/2021 1218   No results found for this or any previous visit (from the past 240 hour(s)).    Radiology Studies: No results  found.  Scheduled Meds:  acetaminophen  1,000 mg Oral Q8H   amLODipine  10 mg Oral Daily   feeding supplement (NEPRO CARB STEADY)  237 mL Oral BID BM   heparin  5,000 Units Subcutaneous Q8H   hydrALAZINE  50 mg Oral Q8H   lidocaine  1 patch Transdermal Q24H   multivitamin  1 tablet Oral QHS   pantoprazole  40 mg Oral QHS   polyethylene glycol  17 g Oral BID   pregabalin  75 mg Oral Daily   sodium bicarbonate  1,300 mg Oral TID   sodium zirconium cyclosilicate  5 g Oral BID   Continuous Infusions:  sodium chloride       LOS: 11 days   Time spent: 25 minutes.  Patrecia Pour, MD [facility] [email or web address] 10/07/2021, 6:17 PM

## 2021-10-08 DIAGNOSIS — Z515 Encounter for palliative care: Secondary | ICD-10-CM | POA: Diagnosis not present

## 2021-10-08 DIAGNOSIS — G8929 Other chronic pain: Secondary | ICD-10-CM

## 2021-10-08 DIAGNOSIS — R52 Pain, unspecified: Secondary | ICD-10-CM

## 2021-10-08 DIAGNOSIS — R001 Bradycardia, unspecified: Secondary | ICD-10-CM | POA: Diagnosis not present

## 2021-10-08 DIAGNOSIS — R531 Weakness: Secondary | ICD-10-CM | POA: Diagnosis not present

## 2021-10-08 DIAGNOSIS — Z66 Do not resuscitate: Secondary | ICD-10-CM | POA: Diagnosis not present

## 2021-10-08 DIAGNOSIS — I10 Essential (primary) hypertension: Secondary | ICD-10-CM | POA: Diagnosis not present

## 2021-10-08 DIAGNOSIS — T829XXA Unspecified complication of cardiac and vascular prosthetic device, implant and graft, initial encounter: Secondary | ICD-10-CM | POA: Diagnosis not present

## 2021-10-08 DIAGNOSIS — N170 Acute kidney failure with tubular necrosis: Secondary | ICD-10-CM | POA: Diagnosis not present

## 2021-10-08 LAB — HEMOGLOBIN AND HEMATOCRIT, BLOOD
HCT: 25.2 % — ABNORMAL LOW (ref 36.0–46.0)
Hemoglobin: 8.4 g/dL — ABNORMAL LOW (ref 12.0–15.0)

## 2021-10-08 LAB — BASIC METABOLIC PANEL
Anion gap: 8 (ref 5–15)
BUN: 31 mg/dL — ABNORMAL HIGH (ref 8–23)
CO2: 19 mmol/L — ABNORMAL LOW (ref 22–32)
Calcium: 9.2 mg/dL (ref 8.9–10.3)
Chloride: 105 mmol/L (ref 98–111)
Creatinine, Ser: 2.78 mg/dL — ABNORMAL HIGH (ref 0.44–1.00)
GFR, Estimated: 17 mL/min — ABNORMAL LOW (ref >60)
Glucose, Bld: 105 mg/dL — ABNORMAL HIGH (ref 70–99)
Potassium: 5.2 mmol/L — ABNORMAL HIGH (ref 3.5–5.1)
Sodium: 132 mmol/L — ABNORMAL LOW (ref 135–145)

## 2021-10-08 MED ORDER — SODIUM ZIRCONIUM CYCLOSILICATE 10 G PO PACK
10.0000 g | PACK | ORAL | Status: DC
  Administered 2021-10-08: 16:00:00 10 g via ORAL
  Filled 2021-10-08: qty 1

## 2021-10-08 NOTE — Progress Notes (Signed)
Occupational Therapy Treatment Patient Details Name: Alyssa [name] MRN: [id number] DOB: [birth year] Today's Date: 10/08/2021   History of present illness The pt is a 80 yo female presenting 12/10 with bradycardia in the 30s and fatigue. Pt found to be hyperkalemic, and also with R femoral aneurysm. Initially planned for repair 12/15, but not medically stable for OR at that time. Of note, pt with recent hospitalization for hypertensive crisis, SNF recommended but pt d/c home instead on 12/1. 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   OT comments  In coordination with RN for pain premedication, pt able to mobilize on a min guard basis using RW today. Pt able to demo LB ADLs with no more than min guard today though did require a rest break after due to fatigue and rising HR (<128bpm). Guided pt in dynamic standing balance challenges to reach overhead and bend to floor to obtain ADL items. Pt without overt LOB but noted reliance on UE support with these dynamic challenges. Educated on fall prevention strategies with translation to obtaining ADL/IADL items at home. Would recommend assist with IADLs, tub transfers and showering tasks initially at home to maximize safety.   At time of PT eval, family reported able to physically assist pt with ADLs/mobility though now inquiring about rehab. If family unable to provide increased support during the day at home, would recommend SNF rehab (though pt is declining rehab with desire to return home with [facility] services). Per son via phone, pt has aide assist through [facility] for 2-2.5 hours but they are working to increase to 5 hours.   Recommendations for follow up therapy are one component of a multi-disciplinary discharge planning process, led by the attending physician.  Recommendations may be updated based on patient status, additional functional criteria and insurance authorization.    Follow Up Recommendations  Home health OT    Assistance  Recommended at Discharge Intermittent Supervision/Assistance  Equipment Recommendations  None recommended by OT    Recommendations for Other Services      Precautions / Restrictions Precautions Precautions: Fall Precaution Comments: Watch HR Restrictions Weight Bearing Restrictions: No       Mobility Bed Mobility Overal bed mobility: Needs Assistance Bed Mobility: Supine to Sit;Sit to Supine     Supine to sit: Supervision;HOB elevated Sit to supine: Supervision   General bed mobility comments: light use of bedrail to lift trunk    Transfers Overall transfer level: Needs assistance Equipment used: Rolling walker (2 wheels) Transfers: Sit to/from Stand Sit to Stand: Supervision                 Balance Overall balance assessment: Needs assistance Sitting-balance support: Feet supported;No upper extremity supported Sitting balance-Leahy Scale: Fair     Standing balance support: Bilateral upper extremity supported;Reliant on assistive device for balance Standing balance-Leahy Scale: Fair Standing balance comment: fair static standing, reliant on UE support during dynamic tasks especially when reaching outside of BOS                           ADL either performed or assessed with clinical judgement   ADL Overall ADL's : Needs assistance/impaired                 Upper Body Dressing : Set up;Sitting Upper Body Dressing Details (indicate cue type and reason): to don gown around back Lower Body Dressing: Min guard;Sit to/from stand Lower Body Dressing Details (indicate cue  type and reason): able to doff/don socks sitting EOB, primarily bending to feet. did fatigue with task but improved with rest break             Functional mobility during ADLs: Min guard;Rolling walker (2 wheels) General ADL Comments: improved mobility after pain premedication. Guided pt in simulated gathering of ADL items bending to floor and reaching overhead. Pt reliant on  BUE support for dynamic tasks with unsteadiness noted though no overt LOB. Pt able to statically stand without support to simulate pulling pants over waist. Translated this to accessing items in kitchen and fridge and encouraged caution with these tasks    Extremity/Trunk Assessment Upper Extremity Assessment Upper Extremity Assessment: Generalized weakness   Lower Extremity Assessment Lower Extremity Assessment: Defer to PT evaluation        Vision   Vision Assessment?: No apparent visual deficits   Perception     Praxis      Cognition Arousal/Alertness: Awake/alert Behavior During Therapy: WFL for tasks assessed/performed Overall Cognitive Status: No family/caregiver present to determine baseline cognitive functioning                                 General Comments: Per chart, has h/o dementia though follows directions well          Exercises     Shoulder Instructions       General Comments Hr up to 128 with LB dressing, decreases to low 100s with rest break    Pertinent Vitals/ Pain       Pain Assessment: Faces Faces Pain Scale: Hurts a little bit Pain Location: L hip Pain Descriptors / Indicators: Aching Pain Intervention(s): Premedicated before session;Monitored during session;Limited activity within patient's tolerance  Home Living                                          Prior Functioning/Environment              Frequency  Min 2X/week        Progress Toward Goals  OT Goals(current goals can now be found in the care plan section)  Progress towards OT goals: Progressing toward goals  Acute Rehab OT Goals Patient Stated Goal: go  home OT Goal Formulation: With patient Time For Goal Achievement: 10/17/21 Potential to Achieve Goals: Good ADL Goals Pt Will Perform Grooming: with modified independence;standing Pt Will Perform Lower Body Bathing: with modified independence;sit to/from stand Pt Will Perform  Lower Body Dressing: with modified independence;sit to/from stand Pt Will Transfer to Toilet: with modified independence;regular height toilet;ambulating Pt Will Perform Toileting - Clothing Manipulation and hygiene: with modified independence;sit to/from stand  Plan Discharge plan remains appropriate    Co-evaluation                 AM-PAC OT "6 Clicks" Daily Activity     Outcome Measure   Help from another person eating meals?: A Little Help from another person taking care of personal grooming?: A Little Help from another person toileting, which includes using toliet, bedpan, or urinal?: A Little Help from another person bathing (including washing, rinsing, drying)?: A Little Help from another person to put on and taking off regular upper body clothing?: A Little Help from another person to put on and taking off regular lower body clothing?: A Little 6  Click Score: 18    End of Session Equipment Utilized During Treatment: Rolling walker (2 wheels);Gait belt  OT Visit Diagnosis: Unsteadiness on feet (R26.81);Muscle weakness (generalized) (M62.81);Pain   Activity Tolerance Patient tolerated treatment well   Patient Left in bed;with bed alarm set;with call bell/phone within reach   Nurse Communication Mobility status        Time: 9539-6728 OT Time Calculation (min): 20 min  Charges: OT General Charges $OT Visit: 1 Visit OT Treatments $Self Care/Home Management : 8-22 mins  Malachy Chamber, OTR/L Acute Rehab Services Office: [phone]   [name] 10/08/2021, 1:35 PM

## 2021-10-08 NOTE — Plan of Care (Signed)

## 2021-10-08 NOTE — Progress Notes (Signed)
PROGRESS NOTE  KRISTILYN COLTRANE  RJJ:[id number] DOB: 03/19/1941 DOA: 09/26/2021 PCP: System, Provider Not In   Brief Narrative: AYAKA ANDES is a 80 y.o. female with a history of PVD s/p fem-fem bypass complicated by anastomotic pseudoaneurysm, stage IV CKD, HTN, HLD, COPD, dementia and recent admission for hypertensive emergency on 12/1 who returned to the ED 12/10 with lightheadedness and fatigue found to have symptomatic sinus bradycardia with rates in 30's. SCr 4.4, K 6.9.  Patient was admitted under CCM service. Cardiology recommended holding coreg and correcting potassium. With treatments, hyperkalemia improved and heart rate has normalized. Hyperkalemia has improved but continues for which nephrology is consulted, recommending dietary education and thrice weekly lokelma. Vascular surgery has recommended discharge and outpatient follow up for management of pseudoaneurysm since extremity has no evidence of ischemia. Palliative care has been consulted. The patient's debility will require SNF rehabilitation prior to returning home.   Assessment & Plan: Principal Problem:   Bradycardia with 31-40 beats per minute Active Problems:   Hyperkalemia   Acute renal failure superimposed on chronic kidney disease ([place])   Essential hypertension   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, implant, and graft  Hyperkalemia: Recurrent.  - Dietary education has been provided including avoidance of most fruits and tomatoes.  - Lokelma TTS at discharge, will give QOD here. - Avoid ACE/ARB.  Anemia of chronic disease, CKD, and malnutrition: Required 1u PRBCs during previous hospitalization. Hgb stable at 8.4g/dl.  - Monitor hgb in 1 week  AKI on stage IV CKD with NAGMA: SCr 4.4 on admission, since returned near baseline in high 2's.  - Follow up at [facility], primary nephrologist is Dr. Joelyn Oms. - Continue bicarbonate tabs - Do not believe hemodialysis would be beneficial  on the whole for this patient. Palliative following.    Dementia:  - Holding aricept due to bradycardia.  - Delirium precautions while admitted, though no agitation noted.  Anorexia, moderate protein calorie malnutrition:  - PTA and continues. Palliative consulted. No plans to initiate artificial feeding, strongly recommend hospice care if not taking adequate per oral intake.  Postherpetic neuralgia: Left lower back/abdomen. No active lesions.  - Continue lyrica. Switched oxycodone to oral dilaudid due to renal dysfunction.  - Lidoderm patch.   CAD:  - Continue aspirin, plavix. Holding BB as above. - F/u with cardiology as outpatient (work up limited by poor renal function).  Left upper quadrant pain: Resolved. Patient had extensive work-up during previous admission at that time CT abdomen pelvis did not show significant abnormality - Continue bowel regimen for constipation.   Sinus bradycardia: Resolved.  - Continue holding coreg.  - Cardiology considering PYP scan to exclude ATTR cardiac amyloid as outpatient   Hypertension:  - Continue to hold coreg, continue norvasc.   Pseudoaneurysm versus hematoma: 5.5 x 4.8 cm lesion in the right groin apparently incorporating the graft anastomosis incidentally noted on CT abd/pelvis during previous admission.  - Patient was seen by vascular surgery in September 2022 for the same and was supposed to follow-up with vascular surgery as outpatient for repair of pseudoaneurysm - Vascular surgical following. D/w Dr. Donzetta Matters today who states the patient can be discharged from their standpoint. They will arrange follow up for repair as outpatient when acute issues are resolved.  DVT prophylaxis: Heparin Code Status: DNR Family Communication: None at bedside. Palliative care speaking regularly with family. Disposition Plan:  Status is: Inpatient  Remains inpatient appropriate because: Persistent metabolic abnormalities, hyperkalemia.   Consultants:  Nephrology Cardiology VVS Palliative care  Procedures:  None  Antimicrobials: None   Subjective: Pain in left side is resolved currently. Has no complaints. No dyspnea, chest pain, bleeding, or other issues. Was not aware of which foods to avoid to treat high potassium.   Objective: Vitals:   10/08/21 0309 10/08/21 0500 10/08/21 0732 10/08/21 1042  BP: (!) 161/93  (!) 142/85 130/84  Pulse:   100 98  Resp:   14 14  Temp: 97.8 F (36.6 C)  97.9 F (36.6 C) 97.7 F (36.5 C)  TempSrc: Oral  Oral Oral  SpO2:   97% 99%  Weight:  44.8 kg    Height:        Intake/Output Summary (Last 24 hours) at 10/08/2021 1426 Last data filed at 10/08/2021 0857 Gross per 24 hour  Intake --  Output 1100 ml  Net -1100 ml   Filed Weights   10/06/21 0247 10/07/21 0325 10/08/21 0500  Weight: 44.6 kg 44.7 kg 44.8 kg   Gen: Elderly frail female in no distress Pulm: Nonlabored breathing room air. Clear. CV: Regular borderline tachycardia without murmur, rub, or gallop. No JVD, no pitting dependent edema. GI: Abdomen soft, non-tender, non-distended, with normoactive bowel sounds.  Ext: Warm, no deformities Skin: No new rashes, lesions or ulcers on visualized skin. Neuro: Alert and mostly oriented without focal neurological deficits. Psych: Judgement and insight appear marginal. Mood euthymic & affect congruent. Behavior is appropriate.    Data Reviewed: I have personally reviewed following labs and imaging studies  CBC: Recent Labs  Lab 10/02/21 0105 10/03/21 0432 10/04/21 0521 10/07/21 0439 10/08/21 0117  WBC 8.5 10.7* 9.6 6.3  --   NEUTROABS  --  5.9 5.0 2.8  --   HGB 9.3* 9.8* 9.0* 8.2* 8.4*  HCT 28.7* 29.5* 26.6* 25.3* 25.2*  MCV 86.4 84.5 84.7 85.2  --   PLT 437* 357 404* 380  --    Basic Metabolic Panel: Recent Labs  Lab 10/03/21 1055 10/03/21 1547 10/04/21 0521 10/04/21 1621 10/05/21 0504 10/05/21 1609 10/06/21 0519 10/06/21 1618 10/07/21 0439 10/07/21 1532  10/08/21 0117  NA 132*  --  135  --  135  --   --   --  134*  --  132*  K 5.1   < > 5.5*   < > 5.2*   < > 5.2* 5.3* 5.2* 5.9* 5.2*  CL 104  --  105  --  106  --   --   --  107  --  105  CO2 17*  --  21*  --  20*  --   --   --  21*  --  19*  GLUCOSE 143*  --  113*  --  109*  --   --   --  103*  --  105*  BUN 39*  --  41*  --  39*  --   --   --  32*  --  31*  CREATININE 2.90*  --  2.93*  --  3.10*  --   --   --  2.58*  --  2.78*  CALCIUM 9.6  --  9.6  --  9.1  --   --   --  9.4  --  9.2  MG  --   --   --   --   --   --   --   --  2.3  --   --   PHOS  --   --   --   --   --   --   --   --  4.7*  --   --    < > = values in this interval not displayed.   GFR: Estimated Creatinine Clearance: 11.8 mL/min (A) (by C-G formula based on SCr of 2.78 mg/dL (H)). Liver Function Tests: No results for input(s): AST, ALT, ALKPHOS, BILITOT, PROT, ALBUMIN in the last 168 hours. No results for input(s): LIPASE, AMYLASE in the last 168 hours. No results for input(s): AMMONIA in the last 168 hours. Coagulation Profile: No results for input(s): INR, PROTIME in the last 168 hours. Cardiac Enzymes: No results for input(s): CKTOTAL, CKMB, CKMBINDEX, TROPONINI in the last 168 hours. BNP (last 3 results) No results for input(s): PROBNP in the last 8760 hours. HbA1C: No results for input(s): HGBA1C in the last 72 hours. CBG: No results for input(s): GLUCAP in the last 168 hours. Lipid Profile: No results for input(s): CHOL, HDL, LDLCALC, TRIG, CHOLHDL, LDLDIRECT in the last 72 hours. Thyroid Function Tests: No results for input(s): TSH, T4TOTAL, FREET4, T3FREE, THYROIDAB in the last 72 hours. Anemia Panel: No results for input(s): VITAMINB12, FOLATE, FERRITIN, TIBC, IRON, RETICCTPCT in the last 72 hours. Urine analysis:    Component Value Date/Time   COLORURINE STRAW (A) 07/18/2021 1218   APPEARANCEUR CLEAR 07/18/2021 1218   LABSPEC 1.008 07/18/2021 1218   PHURINE 8.0 07/18/2021 [place] 07/18/2021 [place] 07/18/2021 [place] 07/18/2021 [place] 07/18/2021 1218   PROTEINUR 100 (A) 07/18/2021 1218   UROBILINOGEN 0.2 08/24/2015 1737   NITRITE NEGATIVE 07/18/2021 1218   LEUKOCYTESUR NEGATIVE 07/18/2021 1218   No results found for this or any previous visit (from the past 240 hour(s)).    Radiology Studies: No results found.  Scheduled Meds:  acetaminophen  1,000 mg Oral Q8H   amLODipine  10 mg Oral Daily   feeding supplement (NEPRO CARB STEADY)  237 mL Oral BID BM   heparin  5,000 Units Subcutaneous Q8H   hydrALAZINE  50 mg Oral Q8H   lidocaine  1 patch Transdermal Q24H   multivitamin  1 tablet Oral QHS   pantoprazole  40 mg Oral QHS   polyethylene glycol  17 g Oral BID   pregabalin  75 mg Oral Daily   sodium bicarbonate  1,300 mg Oral TID   Continuous Infusions:  sodium chloride       LOS: 12 days   Time spent: 25 minutes.  Patrecia Pour, MD [facility] [email or web address] 10/08/2021, 2:26 PM

## 2021-10-08 NOTE — Progress Notes (Signed)
[facility] Progress Note  Name: Alyssa [name] MRN: [id number] DOB: 08/16/1941   Subjective:  Reconsulted today.  Her team is hoping to discharge and asked about recommendations for optimizing. It is likely not all ins/outs captured. 800 mL uop over 12/21 charted with 1.4 liters the day prior. She feels ok today and states that she is being discharged to her home.  She has been eating several high K foods and we discussed foods to avoid including bananas, tomatoes, and oranges.    Review of systems:  Denies n/v No shortness of breath or chest pain ------------- Background on consult:  Alyssa [name] is a 80 y.o. female.  With a history of stage IV chronic kidney disease baseline serum creatinine with history about 2.5 to 3 mg/dL.  She was last seen in [facility] in January 2022 with Dr. Joelyn Oms.  It appears at that time the creatinine was 2.5 mg/dL.  She presented to [facility] bradycardia and hyperkalemia.  Medical therapy with [name] has led to the improvement of her potassium.  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  However due to the hyperkalemia procedure was canceled.   Intake/Output Summary (Last 24 hours) at 10/08/2021 1316 Last data filed at 10/08/2021 0857 Gross per 24 hour  Intake --  Output 1100 ml  Net -1100 ml    Vitals:  Vitals:   10/08/21 0309 10/08/21 0500 10/08/21 0732 10/08/21 1042  BP: (!) 161/93  (!) 142/85 130/84  Pulse:   100 98  Resp:   14 14  Temp: 97.8 F (36.6 C)  97.9 F (36.6 C) 97.7 F (36.5 C)  TempSrc: Oral  Oral Oral  SpO2:   97% 99%  Weight:  44.8 kg    Height:         Physical Exam:  General elderly female in bed in no acute distress HEENT normocephalic atraumatic extraocular movements intact sclera anicteric Neck supple trachea midline Lungs clear to auscultation bilaterally normal work of breathing at rest on room air Heart S1S2 no  rub Abdomen soft nontender nondistended Extremities no edema  Psych normal mood and affect Neuro awake and conversant; follows commands and provides hx   Medications reviewed   Labs:  BMP Latest Ref Rng & Units 10/08/2021 10/07/2021 10/07/2021  Glucose 70 - 99 mg/dL 105(H) - 103(H)  BUN 8 - 23 mg/dL 31(H) - 32(H)  Creatinine 0.44 - 1.00 mg/dL 2.78(H) - 2.58(H)  Sodium 135 - 145 mmol/L 132(L) - 134(L)  Potassium 3.5 - 5.1 mmol/L 5.2(H) 5.9(H) 5.2(H)  Chloride 98 - 111 mmol/L 105 - 107  CO2 22 - 32 mmol/L 19(L) - 21(L)  Calcium 8.9 - 10.3 mg/dL 9.2 - 9.4    Assessment/Plan: CKD stage IV baseline serum creatinine 2.5 to 3 mg/dL.  Followed at [facility] with Dr. Joelyn Oms.  Would avoid ACE inhibitors and ARB's in the setting of her hyperkalemia.  I would avoid nonsteroidal anti-inflammatory drugs.  Hyperkalemia - change to renal diet.  Would dose lokelma 10 gram three times a week as an outpatient  3. Hypertension - improved today 4. Anemia CKD - no acute indication for PRBC's; may need ESA outpatient. Note Hb 11.1 on admit - trend on close follow-up 5.  Metabolic acidosis - on sodium bicarbonate to 2 tab p.o. 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  Bradycardia review resolved likely secondary to hyperkalemia; optimize her potassium  with lokelma outpatient as above  Disposition - with low potassium diet education and lokelma 10 mg three times a week she is stable for discharge today from a renal standpoint.  I will set up follow-up with [facility] in 2 weeks with Dr. Joelyn Oms or an extender   Claudia Desanctis, MD 10/08/2021 1:35 PM

## 2021-10-08 NOTE — TOC Progression Note (Signed)
Transition of Care [facility]) - Progression Note    Patient Details  Name: Alyssa [name] MRN: [id number] Date of Birth: June 17, 1941  Transition of Care [facility]) CM/SW [place], RN Phone Number:[phone]  10/08/2021, 11:51 AM  Clinical Narrative:    Patient currently active with [facility]. Orders entered to resume [facility] services upon discharge.         Expected Discharge Plan and Services           Expected Discharge Date: 10/08/21                                     Social Determinants of Health (SDOH) Interventions    Readmission Risk Interventions Readmission Risk Prevention Plan 07/10/2020  Transportation Screening Complete  Medication Review Press [employer]) Complete  HRI or [place] Complete  SW Recovery Care/Counseling Consult Complete  [place] Not Applicable  Some recent data might be hidden

## 2021-10-08 NOTE — Progress Notes (Signed)
Patient ID: Alyssa [name], female   DOB: 1941/03/24, 80 y.o.   MRN: [id number]    Progress Note from the Palliative Medicine Team at [facility]   [name]        Date: 10/08/2021 DOB: 1941-02-13  Age: 80 y.o. MRN#: [id number] Attending Physician: Patrecia Pour, MD Primary Care Physician: System, Provider Not In Admit Date: 09/26/2021   Medical records reviewed    80 y.o. female   admitted on 09/26/2021 with past medical history significant of HTN; HLD; PVD; stage 4 CKD; COPD; dementia and prior prolonged admission from 8/29-9/23 acute respiratory failure associated with LLL collapse with COPD and active herpes zoster infection.       She reports 2-3 days of mostly SOB with mild left-sided chest discomfort.  Maybe fever, abdominal pain.     This is her third rehospitalization in the last 5 months..  She reports continued slow physical and functional decline.   Admitted for treatment and stabilization.    Patient and family face treatment option decisions, advanced directive decisions and anticipatory care needs.    This NP visited patient at the bedside as a follow up to  yesterday's Oto.  Patient is alert and oriented and reports that her pain is better controlled since the scheduled Tylenol.  After speaking with patient and her son/[name] by telephone the hope remains for transition to skilled nursing facility for short-term rehab as discussed yesterday.  Family hope that  short-term therapy will increase her independence for ultimately return to home.  Plan of care -DNR/DNI -Continue current medical interventions, treat the treatable and hope for continued  life -SNF for short-term rehab - symptom management      -Tylenol 1000 mg p.o. every 8 hours scheduled      - Dilaudid 2 to 4 mg tablet oral every 3 hours as needed for pain as ordered by attending    -Outpatient palliative care services at facility  Education offered to patient  and her son her high risk for decompensation, and the importance of continued conversation with each other  and their  medical providers regarding overall plan of care and treatment options,  ensuring decisions are within the context of the patients values and GOCs.  A MOST form was completed yesterday, but I again stressed the importance of revisiting the decisions regarding medical treatment options on a regular basis with her healthcare providers.  Questions and concerns addressed   Discussed with Dr Bonner Puna and [facility] team   Total time spent on the unit was 45 minutes  This nurse practitioner informed  the family  that I will be out of the hospital until Monday morning.  If the patient is still hospitalized I will follow-up at that time.   Call palliative medicine team phone # [phone] with questions or concerns in the interim.  Greater than 50% of the time was spent in counseling and coordination of care  Wadie Lessen NP  Palliative Medicine Team Team Phone # [phone] Pager [phone]

## 2021-10-08 NOTE — Progress Notes (Signed)
Physical Therapy Treatment Patient Details Name: Alyssa [name] MRN: [id number] DOB: 06/22/1941 Today's Date: 10/08/2021   History of Present Illness Pt is a 80 y.o. female presenting 09/26/21 with bradycardia in the 30s and fatigue. Pt found to be hyperkalemic, and also with R femoral aneurysm. Initially planned for repair 12/15, but not medically stable for OR at that time; plan for outpatient vascular followup. Of note, pt with recent hospitalization for hypertensive crisis, SNF recommended but pt d/c home instead on 12/1. PMH includes dementia, CKD IV with no plans for HD, COPD.   PT Comments    Pt progressing with mobility. Pt remains limited by generalized weakness, decreased activity tolerance, and impaired balance strategies/postural reactions, requiring intermittent assist to maintain stability with mobility and ADL tasks. Family requesting short-term SNF-level therapies since unable to provide necessary assist pt currently requires; therefore, d/c recommendation updated to SNF to maximize functional mobility and independence prior to return home; pt in agreement.  HR up to 136 with mobility    Recommendations for follow up therapy are one component of a multi-disciplinary discharge planning process, led by the attending physician.  Recommendations may be updated based on patient status, additional functional criteria and insurance authorization.  Follow Up Recommendations  Skilled nursing-short term rehab (<3 hours/day)     Assistance Recommended at Discharge Frequent or constant Supervision/Assistance  Equipment Recommendations  None recommended by PT    Recommendations for Other Services       Precautions / Restrictions Precautions Precautions: Fall;Other (comment) Precaution Comments: Watch HR; urinary incontinence Restrictions Weight Bearing Restrictions: No     Mobility  Bed Mobility Overal bed mobility: Modified Independent Bed Mobility: Supine to Sit;Sit  to Supine     Supine to sit: Supervision;HOB elevated Sit to supine: Supervision   General bed mobility comments: light use of bedrail to lift trunk    Transfers Overall transfer level: Needs assistance Equipment used: Rollator (4 wheels);1 person hand held assist Transfers: Sit to/from Stand Sit to Stand: Supervision;Min assist;Min guard           General transfer comment: Initial sit<>stand from EOB with rollator, supervision for safety; additional 2x trial without DME, minA for HHA then min guard for balance without UE support    Ambulation/Gait Ambulation/Gait assistance: Min guard;Min assist Gait Distance (Feet): 120 Feet (+ 36') Assistive device: Rollator (4 wheels);1 person hand held assist Gait Pattern/deviations: Step-through pattern;Trunk flexed Gait velocity: Decreased     General Gait Details: Initial ambualtion with rollator and intermittent min guard for balance; prolonged seated rest break secondary to fatigue and DOE, then additional gait trial without DME, minA for HHA and pt frequently reaching to furniture for additional UE support. Pt with baseline leg length discrepancy, compensates well without her built-up shoe from home   Stairs             Wheelchair Mobility    Modified Rankin (Stroke Patients Only)       Balance Overall balance assessment: Needs assistance Sitting-balance support: Feet supported;No upper extremity supported Sitting balance-Leahy Scale: Fair     Standing balance support: Bilateral upper extremity supported;Reliant on assistive device for balance;No upper extremity supported Standing balance-Leahy Scale: Fair Standing balance comment: fair static standing, reliant on UE support during dynamic tasks especially when reaching outside of BOS                            Cognition Arousal/Alertness: Awake/alert Behavior During Therapy: [facility]  for tasks assessed/performed Overall Cognitive Status: No  family/caregiver present to determine baseline cognitive functioning                                 General Comments: Per chart, h/o dementia. Pt pleasant and agreeable, following directions and answering questions appropriately        Exercises      General Comments General comments (skin integrity, edema, etc.): HR up to 136 with activity. Pt requiring frequent seated rest breaks to recover fatigue and DOE between activity bouts (ambulation and ADL tasks); pt requiring assist for set-up and safety with ADL task of washing up after bout of urine incontinence      Pertinent Vitals/Pain Pain Assessment: Faces Faces Pain Scale: Hurts little more Pain Location: L hip, back Pain Descriptors / Indicators: Constant;Discomfort Pain Intervention(s): Monitored during session;Limited activity within patient's tolerance    Home Living                          Prior Function            PT Goals (current goals can now be found in the care plan section) Acute Rehab PT Goals Patient Stated Goal: Short-term rehab at SNF to regain strength/independence before return home PT Goal Formulation: With patient/family Time For Goal Achievement: 10/22/21 Potential to Achieve Goals: Good Progress towards PT goals: Progressing toward goals    Frequency    Min 3X/week      PT Plan Discharge plan needs to be updated    Co-evaluation              AM-PAC PT "6 Clicks" Mobility   Outcome Measure  Help needed turning from your back to your side while in a flat bed without using bedrails?: None Help needed moving from lying on your back to sitting on the side of a flat bed without using bedrails?: A Little Help needed moving to and from a bed to a chair (including a wheelchair)?: A Little Help needed standing up from a chair using your arms (e.g., wheelchair or bedside chair)?: A Little Help needed to walk in hospital room?: A Little Help needed climbing 3-5 steps  with a railing? : A Little 6 Click Score: 19    End of Session Equipment Utilized During Treatment: Gait belt Activity Tolerance: Patient tolerated treatment well Patient left: in bed;with call bell/phone within reach;with bed alarm set Nurse Communication: Mobility status PT Visit Diagnosis: Muscle weakness (generalized) (M62.81);Difficulty in walking, not elsewhere classified (R26.2) Pain - Right/Left: Left     Time: 1683-7290 PT Time Calculation (min) (ACUTE ONLY): 22 min  Charges:  $Therapeutic Activity: 8-22 mins                     Mabeline Caras, PT, DPT Acute Rehabilitation Services  Pager [phone] Office [place] 10/08/2021, 3:26 PM
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
## 2021-10-09 ENCOUNTER — Other Ambulatory Visit (HOSPITAL_COMMUNITY): Payer: Self-pay

## 2021-10-09 DIAGNOSIS — E875 Hyperkalemia: Secondary | ICD-10-CM | POA: Diagnosis not present

## 2021-10-09 DIAGNOSIS — N170 Acute kidney failure with tubular necrosis: Secondary | ICD-10-CM | POA: Diagnosis not present

## 2021-10-09 DIAGNOSIS — I10 Essential (primary) hypertension: Secondary | ICD-10-CM | POA: Diagnosis not present

## 2021-10-09 DIAGNOSIS — R001 Bradycardia, unspecified: Secondary | ICD-10-CM | POA: Diagnosis not present

## 2021-10-09 LAB — RENAL FUNCTION PANEL
Albumin: 3 g/dL — ABNORMAL LOW (ref 3.5–5.0)
Anion gap: 7 (ref 5–15)
BUN: 31 mg/dL — ABNORMAL HIGH (ref 8–23)
CO2: 22 mmol/L (ref 22–32)
Calcium: 9.6 mg/dL (ref 8.9–10.3)
Chloride: 105 mmol/L (ref 98–111)
Creatinine, Ser: 2.91 mg/dL — ABNORMAL HIGH (ref 0.44–1.00)
GFR, Estimated: 16 mL/min — ABNORMAL LOW (ref >60)
Glucose, Bld: 105 mg/dL — ABNORMAL HIGH (ref 70–99)
Phosphorus: 5.4 mg/dL — ABNORMAL HIGH (ref 2.5–4.6)
Potassium: 5.4 mmol/L — ABNORMAL HIGH (ref 3.5–5.1)
Sodium: 134 mmol/L — ABNORMAL LOW (ref 135–145)

## 2021-10-09 MED ORDER — HYDROMORPHONE HCL 2 MG PO TABS
1.0000 mg | ORAL_TABLET | ORAL | 0 refills | Status: DC | PRN
Start: 2021-10-09 — End: 2021-10-24
  Filled 2021-10-09: qty 15, 3d supply, fill #0

## 2021-10-09 MED ORDER — SODIUM CHLORIDE 0.9 % IV BOLUS
250.0000 mL | Freq: Once | INTRAVENOUS | Status: AC
  Administered 2021-10-09: 06:00:00 250 mL via INTRAVENOUS

## 2021-10-09 MED ORDER — SODIUM ZIRCONIUM CYCLOSILICATE 10 G PO PACK
10.0000 g | PACK | Freq: Once | ORAL | Status: AC
  Administered 2021-10-09: 06:00:00 10 g via ORAL
  Filled 2021-10-09: qty 1

## 2021-10-09 MED ORDER — HYDRALAZINE HCL 50 MG PO TABS
50.0000 mg | ORAL_TABLET | Freq: Three times a day (TID) | ORAL | 0 refills | Status: DC
  Filled 2021-10-09: qty 90, 30d supply, fill #0

## 2021-10-09 MED ORDER — LOKELMA 10 G PO PACK
10.0000 g | PACK | ORAL | 0 refills | Status: DC
  Filled 2021-10-09: qty 15, 35d supply, fill #0

## 2021-10-09 MED ORDER — CERTAVITE/ANTIOXIDANTS PO TABS
1.0000 | ORAL_TABLET | Freq: Every day | ORAL | 0 refills | Status: DC
Start: 2021-10-09 — End: 2021-11-17
  Filled 2021-10-09: qty 30, 30d supply, fill #0

## 2021-10-09 NOTE — Discharge Summary (Signed)
Physician Discharge Summary  Alyssa [name] UUV:[id number] DOB: September 18, 1941 DOA: 09/26/2021  PCP: System, Provider Not In  Admit date: 09/26/2021 Discharge date: 10/09/2021  Admitted From: Home Disposition: Home   Recommendations for Outpatient Follow-up:  Follow up with PCP in 1-2 weeks Hold coreg and aricept due to severe symptomatic bradycardia.  Follow up with nephrology ([place]) and repeat metabolic panel at follow up. Follow up with [facility] to discuss treatment for pseudoaneurysm (see below) Follow up with cardiology after discharge. Reportedly considering PYP scan to exclude ATTR cardiac amyloid as outpatient.  Home Health: Resume PT, OT, aide, RN, CSW Equipment/Devices: None new Discharge Condition: Stable CODE STATUS: DNR Diet recommendation: Renal (low potassium)  Brief/Interim Summary: Alyssa [name] is a 80 y.o. female with a history of PVD s/p fem-fem bypass complicated by anastomotic pseudoaneurysm, stage IV CKD, HTN, HLD, COPD, dementia and recent admission for hypertensive emergency on 12/1 who returned to the ED 12/10 with lightheadedness and fatigue found to have symptomatic sinus bradycardia with rates in 30's. SCr 4.4, K 6.9.  Patient was admitted under CCM service. Cardiology recommended holding coreg and correcting potassium. With treatments, hyperkalemia improved and heart rate has normalized. Hyperkalemia has improved but continues for which nephrology is consulted, recommending dietary education and thrice weekly lokelma. Vascular surgery has recommended discharge and outpatient follow up for management of pseudoaneurysm since extremity has no evidence of ischemia. Palliative care has been consulted. The patient's debility has improved to the point that she no longer qualifies for SNF rehabilitation at discharge and will be discharged with maximal home health services.   Discharge Diagnoses:  Principal Problem:    Bradycardia with 31-40 beats per minute Active Problems:   Hyperkalemia   Acute renal failure superimposed on chronic kidney disease ([place])   Essential hypertension   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, implant, and graft  Hyperkalemia: Recurrent.  - Dietary education has been provided daily and by multiple providers. Printed list provided at discharge.  - Change home multivitamin to renal multivitamin.  - Lokelma TIW Rx at discharge, stable for discharge from nephrology today. - Avoid ACE/ARB.   Anemia of chronic disease, CKD, and malnutrition: Required 1u PRBCs during previous hospitalization. Hgb stable at 8.4g/dl.  - Monitor hgb in 1 week   AKI on stage IV CKD with NAGMA: SCr 4.4 on admission, since returned near baseline in high 2's.  - Follow up at [facility], primary nephrologist is Dr. Joelyn Oms. - Continue bicarbonate tabs - Do not believe hemodialysis would be beneficial on the whole for this patient. Palliative following.    Dementia:  - Holding aricept due to bradycardia.  - Delirium precautions while admitted, though no agitation noted.   Anorexia, moderate protein calorie malnutrition:  - PTA and continues. Palliative consulted. No plans to initiate artificial feeding, strongly recommend hospice care if not taking adequate per oral intake.   CAD:  - Continue aspirin, plavix. Holding BB as above. - F/u with cardiology as outpatient (work up limited by poor renal function).   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 Patient had extensive work-up during previous admission at that time CT abdomen pelvis did not show significant abnormality - Continue lyrica.  - Switched oxycodone to oral dilaudid due to renal dysfunction. Last Rx'ed 18 oxycodone tabs (5mg ) 12/1 on PDMP review. Rx dilaudid 2mg  po tabs #15 at discharge now.  - Lidoderm patch. - Continue bowel regimen  for constipation.    Sinus bradycardia: Resolved.  - Continue holding coreg.  - Cardiology considering PYP scan to exclude ATTR cardiac amyloid as outpatient   Hypertension:  - Continue to hold coreg, substituted with hydralazine 50mg  q8h. Continue norvasc.   Pseudoaneurysm versus hematoma: 5.5 x 4.8 cm lesion in the right groin apparently incorporating the graft anastomosis incidentally noted on CT abd/pelvis during previous admission.  - Patient was seen by vascular surgery in September 2022 for the same and was supposed to follow-up with vascular surgery as outpatient for repair of pseudoaneurysm - Vascular surgical following. D/w Dr. Donzetta Matters who states the patient can be discharged from their standpoint. They will arrange follow up for repair as outpatient when acute issues are resolved.  Discharge Instructions Discharge Instructions     Call MD for:  difficulty breathing, headache or visual disturbances   Complete by: As directed    Call MD for:  extreme fatigue   Complete by: As directed    Call MD for:  persistant dizziness or light-headedness   Complete by: As directed    Call MD for:  persistant nausea and vomiting   Complete by: As directed    Call MD for:  severe uncontrolled pain   Complete by: As directed    Call MD for:  temperature >100.4   Complete by: As directed    Discharge instructions   Complete by: As directed    You were admitted for slow heart rate, high potassium level, and worsened kidney function. These have all improved.  - Home health services have been maximized to support you going back home. Based on your current functional level, your insurance will not cover a stay at a rehabilitation facility at this time.  - You will need to stop taking coreg, clonidine, and aricept as these slow the heart rate.  - To treat high blood pressure, start taking hydralazine three times per day.  - You will need to follow up with the kidney specialist, preferably in the next 1-2 weeks for  repeat labs. In the mean time, follow a very low potassium diet (information provided specifically for foods to avoid), and take lokelma three times per week to lower potassium. This should be taken separately from other medications (at least 2 hours between taking this and other meds).  - To treat the pain on your left side, which is likely postherpetic neuralgia after shingles, you may take tylenol and you have also been prescribed an opioid medication, dilaudid. Take half a tablet for moderate pain and a full tablet for severe pain up to every 4 hours as needed. You will need to follow up with your doctor to continue pain management.  - Continue taking other medications as you were.  - Please call the vascular surgeons' office next week if you are not contacted to arrange a follow up appointment for a pseudoaneurysm affecting the bypass graft. This can be discussed further and performed after you are discharged.  - Follow up with cardiology as well, as they may recommend additional testing for your slowed heart rate.  - Of course, seek medical attention right away if your symptoms return/worsen.      Allergies as of 10/09/2021   No Known Allergies      Medication List     STOP taking these medications    carvedilol 12.5 MG tablet Commonly known as: COREG   cloNIDine 0.1 MG tablet Commonly known as: CATAPRES   donepezil 5 MG tablet Commonly  known as: ARICEPT   Hair/Skin/Nails Tabs       TAKE these medications    acetaminophen 650 MG CR tablet Commonly known as: TYLENOL Take 650 mg by mouth daily.   albuterol 108 (90 Base) MCG/ACT inhaler Commonly known as: VENTOLIN HFA Inhale 2 puffs into the lungs every 4 (four) hours as needed for wheezing or shortness of breath.   amitriptyline 10 MG tablet Commonly known as: ELAVIL Take 10 mg by mouth daily.   amLODipine 10 MG tablet Commonly known as: NORVASC Take 1 tablet (10 mg total) by mouth daily.   aspirin 81 MG EC  tablet Take 1 tablet (81 mg total) by mouth daily.   Breztri Aerosphere 160-9-4.8 MCG/ACT Aero Generic drug: Budeson-Glycopyrrol-Formoterol Inhale 2 puffs into the lungs 2 (two) times daily.   clopidogrel 75 MG tablet Commonly known as: PLAVIX Take 1 tablet (75 mg total) by mouth daily.   diclofenac Sodium 1 % Gel Commonly known as: VOLTAREN Apply 2 g topically every 6 (six) hours as needed (knee pain).   dicyclomine 20 MG tablet Commonly known as: BENTYL Take 20 mg by mouth 3 (three) times daily.   docusate sodium 100 MG capsule Commonly known as: COLACE Take 1 capsule (100 mg total) by mouth daily.   Ensure Plus Liqd Take 237 mLs by mouth daily.   hydrALAZINE 50 MG tablet Commonly known as: APRESOLINE Take 1 tablet (50 mg total) by mouth every 8 (eight) hours.   HYDROmorphone 2 MG tablet Commonly known as: DILAUDID Take 0.5-1 tablets (1-2 mg total) by mouth every 4 (four) hours as needed for severe pain or moderate pain.   hydrOXYzine 25 MG tablet Commonly known as: ATARAX Take 25 mg by mouth at bedtime.   Iron High-Potency 325 MG Tabs Take 325 mg by mouth daily with breakfast. What changed: when to take this   lidocaine 5 % Commonly known as: LIDODERM Place 1 patch onto the skin daily.   Lokelma 10 g Pack packet Generic drug: sodium zirconium cyclosilicate Take 10 g by mouth 3 (three) times a week. Do not take this within 2 hours of other medications   magnesium oxide 400 MG tablet Commonly known as: MAG-OX Take 400 mg by mouth 2 (two) times daily.   megestrol 40 MG tablet Commonly known as: MEGACE Take 40 mg by mouth 2 (two) times daily.   methocarbamol 500 MG tablet Commonly known as: ROBAXIN Take 500 mg by mouth 3 (three) times daily.   multivitamin Tabs tablet Take 1 tablet by mouth at bedtime.   nitroGLYCERIN 0.4 MG SL tablet Commonly known as: NITROSTAT Place 1 tablet (0.4 mg total) under the tongue every 5 (five) minutes as needed for chest  pain.   pantoprazole 40 MG tablet Commonly known as: PROTONIX Take 1 tablet (40 mg total) by mouth daily.   pregabalin 75 MG capsule Commonly known as: LYRICA Take 1 capsule (75 mg total) by mouth daily.   sodium bicarbonate 650 MG tablet Take 2 tablets (1,300 mg total) by mouth 2 (two) times daily.   valproic acid 250 MG capsule Commonly known as: DEPAKENE Take 250 mg by mouth at bedtime.        Follow-up Information     Troy Sine, MD Follow up.   Specialty: Cardiology Contact information: [address] [place] [address] [phone]                No Known Allergies  Consultations: Nephrology Cardiology [facility] Palliative  care  Procedures/Studies: CT ABDOMEN PELVIS WO CONTRAST  Result Date: 09/15/2021 CLINICAL DATA:  Abdominal pain. EXAM: CT ABDOMEN AND PELVIS WITHOUT CONTRAST TECHNIQUE: Multidetector CT imaging of the abdomen and pelvis was performed following the standard protocol without IV contrast. COMPARISON:  06/16/2021 FINDINGS: Lower chest: Bibasilar collapse/consolidation, left greater than right. The left lower lobe collapse seen previously has decreased in the interval. Hepatobiliary: No focal abnormality in the liver on this study without intravenous contrast. There is no evidence for gallstones, gallbladder wall thickening, or pericholecystic fluid. Dilatation of the common bile duct again noted measuring up to 10 mm in the head of the pancreas, similar to prior. Pancreas: Diffuse parenchymal atrophy without appreciable main duct dilatation. Spleen: No splenomegaly. No focal mass lesion. Adrenals/Urinary Tract: No adrenal nodule or mass. Right kidney atrophic. No hydronephrosis. 14 mm low-density lesion upper interpolar left kidney is stable, likely a cyst. No evidence for hydroureter. The urinary bladder appears normal for the degree of distention. Stomach/Bowel: Stomach is decompressed. Duodenum is normally positioned as is the  ligament of [place]. No small bowel wall thickening. No small bowel dilatation. The terminal ileum is normal. The appendix is not well visualized, but there is no edema or inflammation in the region of the cecum. Diffuse wall thickening noted in the distal descending and sigmoid colon with diverticular disease. No findings to suggest diverticulitis. Vascular/Lymphatic: There is advanced atherosclerotic calcification of the abdominal aorta without aneurysm. There is no gastrohepatic or hepatoduodenal ligament lymphadenopathy. No retroperitoneal or mesenteric lymphadenopathy. No pelvic sidewall lymphadenopathy. Fem-fem bypass graft again noted. Lesion in the right groin apparently incorporating the graft anastomosis measures 5.5 x 4.8 cm today compared to 4.8 x 4.1 cm previously. Imaging features today suggest hematoma or pseudoaneurysm Reproductive: Calcified fibroid noted in the uterus. There is no adnexal mass. Other: Small volume intraperitoneal free fluid likely although inferior pelvis some low-dose cured by beam hardening artifact from right hip replacement. Musculoskeletal: Right hip prosthesis again noted. Bones are diffusely demineralized. Lumbar fusion hardware evident. IMPRESSION: 1. Diffuse wall thickening in the distal descending and sigmoid colon with diverticular disease. No findings to suggest diverticulitis. 2. 5.5 x 4.8 cm lesion in the right groin apparently incorporating the graft anastomosis. Imaging features today suggests hematoma or pseudoaneurysm. 3. Small volume intraperitoneal free fluid. 4. Bibasilar collapse/consolidation, left greater than right. The left lower lobe collapse seen previously has improved in the interval. 5. Stable dilatation of the common bile duct. 6. Aortic Atherosclerosis (ICD10-I70.0). Electronically Signed   By: Misty Stanley M.D.   On: 09/15/2021 09:52   NM Pulmonary Perfusion  Result Date: 09/10/2021 CLINICAL DATA:  COPD, dementia, short of breath and  left-sided chest discomfort EXAM: NUCLEAR MEDICINE PERFUSION LUNG SCAN TECHNIQUE: Perfusion images were obtained in multiple projections after intravenous injection of radiopharmaceutical. Ventilation scans intentionally deferred if perfusion scan and chest x-ray adequate for interpretation during COVID 19 epidemic. RADIOPHARMACEUTICALS:  3.8 mCi Tc-45m MAA IV COMPARISON:  09/10/2021 FINDINGS: Planar images of the chest were obtained in multiple projections during the perfusion examination. Normal distribution of radiotracer is seen throughout the lungs. There are no wedge-shaped perfusion defects to suggest pulmonary embolus. IMPRESSION: 1. No evidence of pulmonary embolus. Electronically Signed   By: Randa Ngo M.D.   On: 09/10/2021 19:55   DG Chest Port 1 View  Result Date: 09/26/2021 CLINICAL DATA:  Weakness EXAM: PORTABLE CHEST 1 VIEW COMPARISON:  09/10/2021 FINDINGS: Stable cardiomediastinal contours. Probable mild atelectasis at the right lung base. No pleural effusion.  IMPRESSION: Mild atelectasis right lung base. Electronically Signed   By: Macy Mis M.D.   On: 09/26/2021 17:50   DG Chest Portable 1 View  Result Date: 09/10/2021 CLINICAL DATA:  Chest pain and shortness of breath. EXAM: PORTABLE CHEST 1 VIEW COMPARISON:  July 25, 2021 FINDINGS: The heart size and mediastinal contours are stable. The aorta is tortuous. Both lungs are clear. The visualized skeletal structures are stable. IMPRESSION: No active disease. Electronically Signed   By: Abelardo Diesel M.D.   On: 09/10/2021 15:31   ECHOCARDIOGRAM COMPLETE  Result Date: 09/13/2021    ECHOCARDIOGRAM REPORT   Patient Name:   DELAYNEE ALRED Date of Exam: 09/13/2021 Medical Rec #:  [id number]            Height:       63.0 in Accession #:    [id number]           Weight:       95.0 lb Date of Birth:  1941/02/13           BSA:          1.409 m Patient Age:    33 years             BP:           121/73 mmHg Patient Gender: F                     HR:           84 bpm. Exam Location:  Inpatient Procedure: 2D Echo Indications:    chest pain  History:        Patient has prior history of Echocardiogram examinations, most                 recent 05/29/2020. Chronic kidney disease. and COPD,                 Signs/Symptoms:Shortness of Breath; Risk Factors:Hypertension.  Sonographer:    Johny Chess RDCS Referring Phys: [place]  1. Left ventricular ejection fraction, by estimation, is 60 to 65%. The left ventricle has normal function. The left ventricle has no regional wall motion abnormalities. There is moderate concentric left ventricular hypertrophy. Left ventricular diastolic parameters are consistent with Grade I diastolic dysfunction (impaired relaxation).  2. Right ventricular systolic function is normal. The right ventricular size is normal. There is normal pulmonary artery systolic pressure. The estimated right ventricular systolic pressure is 25.3 mmHg.  3. The mitral valve is grossly normal. Trivial mitral valve regurgitation. No evidence of mitral stenosis.  4. The aortic valve is tricuspid. There is mild calcification of the aortic valve. There is mild thickening of the aortic valve. Aortic valve regurgitation is trivial. Aortic valve sclerosis is present, with no evidence of aortic valve stenosis.  5. The inferior vena cava is normal in size with greater than 50% respiratory variability, suggesting right atrial pressure of 3 mmHg. Comparison(s): Changes from prior study are noted. EF unchanged. LVH appears moderate on this study. FINDINGS  Left Ventricle: Left ventricular ejection fraction, by estimation, is 60 to 65%. The left ventricle has normal function. The left ventricle has no regional wall motion abnormalities. The left ventricular internal cavity size was normal in size. There is  moderate concentric left ventricular hypertrophy. Left ventricular diastolic parameters are consistent with Grade I diastolic  dysfunction (impaired relaxation). Right Ventricle: The right ventricular size is normal. No increase in right ventricular wall thickness. Right  ventricular systolic function is normal. There is normal pulmonary artery systolic pressure. The tricuspid regurgitant velocity is 2.47 m/s, and  with an assumed right atrial pressure of 3 mmHg, the estimated right ventricular systolic pressure is 09.4 mmHg. Left Atrium: Left atrial size was normal in size. Right Atrium: Right atrial size was normal in size. Pericardium: Trivial pericardial effusion is present. Mitral Valve: The mitral valve is grossly normal. Trivial mitral valve regurgitation. No evidence of mitral valve stenosis. Tricuspid Valve: The tricuspid valve is grossly normal. Tricuspid valve regurgitation is trivial. No evidence of tricuspid stenosis. Aortic Valve: The aortic valve is tricuspid. There is mild calcification of the aortic valve. There is mild thickening of the aortic valve. Aortic valve regurgitation is trivial. Aortic valve sclerosis is present, with no evidence of aortic valve stenosis. Pulmonic Valve: The pulmonic valve was grossly normal. Pulmonic valve regurgitation is trivial. No evidence of pulmonic stenosis. Aorta: The aortic root and ascending aorta are structurally normal, with no evidence of dilitation. Venous: The inferior vena cava is normal in size with greater than 50% respiratory variability, suggesting right atrial pressure of 3 mmHg. IAS/Shunts: The atrial septum is grossly normal.  LEFT VENTRICLE PLAX 2D LVIDd:         3.80 cm   Diastology LVIDs:         2.80 cm   LV e' medial:    6.53 cm/s LV PW:         1.38 cm   LV E/e' medial:  12.1 LV IVS:        1.37 cm   LV e' lateral:   8.27 cm/s LVOT diam:     1.90 cm   LV E/e' lateral: 9.6 LV SV:         50 LV SV Index:   36 LVOT Area:     2.84 cm  RIGHT VENTRICLE            IVC RV S prime:     9.90 cm/s  IVC diam: 1.20 cm TAPSE (M-mode): 1.5 cm LEFT ATRIUM             Index         RIGHT ATRIUM           Index LA diam:        3.80 cm 2.70 cm/m   RA Area:     11.60 cm LA Vol (A2C):   40.0 ml 28.38 ml/m  RA Volume:   24.90 ml  17.67 ml/m LA Vol (A4C):   41.7 ml 29.59 ml/m LA Biplane Vol: 40.8 ml 28.95 ml/m  AORTIC VALVE LVOT Vmax:   105.00 cm/s LVOT Vmean:  67.700 cm/s LVOT VTI:    0.177 m  AORTA Ao Root diam: 2.80 cm Ao Asc diam:  3.70 cm MITRAL VALVE                TRICUSPID VALVE MV Area (PHT): 3.91 cm     TR Peak grad:   24.4 mmHg MV Decel Time: 194 msec     TR Vmax:        247.00 cm/s MV E velocity: 79.20 cm/s MV A velocity: 148.00 cm/s  SHUNTS MV E/A ratio:  0.54         Systemic VTI:  0.18 m                             Systemic Diam: 1.90 cm Eleonore Chiquito MD  Electronically signed by Eleonore Chiquito MD Signature Date/Time: 09/13/2021/4:58:19 PM    Final     Subjective: No complaints, her pain is better controlled with dilaudid and no sedation is noted. No difficulty breathing or chest pain. Eating better and getting around ok.  Discharge Exam: BP 132/89 (BP Location: Right Arm)    Pulse (!) 102    Temp (!) 97.2 F (36.2 C) (Oral)    Resp 12    Ht 5\' 3"  (1.6 m)    Wt 43.5 kg    SpO2 98%    BMI 16.99 kg/m   General: Pt is alert, awake, not in acute distress Cardiovascular: RRR, S1/S2 +, no rubs, no gallops Respiratory: CTA bilaterally, no wheezing, no rhonchi Abdominal: Soft, NT, ND, bowel sounds +. Tenderness over Left midabdomen and flank to light palpation without visible or palpable abnormalities. Extremities: Stable edema, no cyanosis  Labs: BNP (last 3 results) Recent Labs    09/10/21 1446  BNP 1,497.0*   Basic Metabolic Panel: Recent Labs  Lab 10/04/21 0521 10/04/21 1621 10/05/21 0504 10/05/21 1609 10/06/21 1618 10/07/21 0439 10/07/21 1532 10/08/21 0117 10/09/21 0105  NA 135  --  135  --   --  134*  --  132* 134*  K 5.5*   < > 5.2*   < > 5.3* 5.2* 5.9* 5.2* 5.4*  CL 105  --  106  --   --  107  --  105 105  CO2 21*  --  20*  --   --  21*  --   19* 22  GLUCOSE 113*  --  109*  --   --  103*  --  105* 105*  BUN 41*  --  39*  --   --  32*  --  31* 31*  CREATININE 2.93*  --  3.10*  --   --  2.58*  --  2.78* 2.91*  CALCIUM 9.6  --  9.1  --   --  9.4  --  9.2 9.6  MG  --   --   --   --   --  2.3  --   --   --   PHOS  --   --   --   --   --  4.7*  --   --  5.4*   < > = values in this interval not displayed.   Liver Function Tests: Recent Labs  Lab 10/09/21 0105  ALBUMIN 3.0*   CBC: Recent Labs  Lab 10/03/21 0432 10/04/21 0521 10/07/21 0439 10/08/21 0117  WBC 10.7* 9.6 6.3  --   NEUTROABS 5.9 5.0 2.8  --   HGB 9.8* 9.0* 8.2* 8.4*  HCT 29.5* 26.6* 25.3* 25.2*  MCV 84.5 84.7 85.2  --   PLT 357 404* 380  --    Time coordinating discharge: Approximately 40 minutes  Patrecia Pour, MD  [facility] 10/09/2021, 2:08 PM

## 2021-10-09 NOTE — NC FL2 (Signed)
[place] LEVEL OF CARE SCREENING TOOL     IDENTIFICATION  [name] Birthdate: Nov 03, 1940 Sex: female Admission Date (Current Location): 09/26/2021  [facility] and [place] Number:  Herbalist and Address:  The . 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, implant, and graft 09/29/2021   Bradycardia with 31-40 beats per minute 09/26/2021   Hypertensive crisis 09/10/2021   Rectal bleeding    Rectal pain    Malnutrition of moderate degree 06/17/2021   Weight loss 06/16/2021   Zoster 06/16/2021   Collapsed lung    COPD  GOLD 0/ still smoking  09/26/2020   Cigarette smoker 09/26/2020   Shortness of breath 07/06/2020   Essential hypertension 07/06/2020   Abdominal pain 07/06/2020   Elevated INR 07/06/2020   Acute respiratory failure due to COVID-19 ([place]) 06/02/2020   Acute renal failure superimposed on chronic kidney disease ([place]) 02/06/2020   Leukocytosis 02/06/2020   Thrombocytosis 02/06/2020   Palliative care encounter    Constipation 01/05/2020   Protein-calorie malnutrition, severe ([place]) 01/04/2020   Acute on chronic renal failure ([place]) 01/04/2020   Hyperkalemia 01/03/2020   AKI (acute kidney injury) ([place]) 01/03/2020   Acute respiratory failure with hypoxia ([place]) 11/01/2019   UTI (urinary tract infection) 11/01/2019   CAP (community acquired pneumonia) 11/01/2019   Acute cystitis without hematuria    Encephalopathy 09/18/2019   SAH  (subarachnoid hemorrhage) ([place]) 09/18/2019   Dementia ([place]) 81/85/6314   Metabolic acidosis 97/11/6376   ARF (acute renal failure) ([place]) 09/17/2019   Acute encephalopathy 09/17/2019   Abnormal CT scan, colon    Diarrhea    Diverticulitis 06/07/2019   Depression with anxiety 06/07/2019   DNR (do not resuscitate) 03/23/2019   Volume overload 03/23/2019   Edema 03/23/2019   Acute renal failure (ARF) ([place]) 03/08/2019   Hypokalemia 03/08/2019   Palliative care by specialist    Goals of care, counseling/discussion    Altered mental status    Encephalopathy, toxic 12/24/2018   Aspiration pneumonia of both lower lobes due to gastric secretions ([place]) 12/24/2018   Non-ST elevation (NSTEMI) myocardial infarction [facility])    Chest pain    Dyspnea on exertion    Elevated troponin level 12/09/2018   Chronic migraine 03/27/2018   Lumbar radiculopathy 03/27/2018   Gait abnormality 03/27/2018   Hypertensive urgency 12/28/2016   CKD (chronic kidney disease) stage 4, GFR 15-29 ml/min (HCC) 12/28/2016   Anemia 12/28/2016   Chronic pain syndrome 12/28/2016   Benzodiazepine withdrawal without complication ([place]) 58/85/0277   Chronic right shoulder pain 12/06/2016   Fall 09/17/2016   Fracture of humeral head, closed, right, initial encounter 09/17/2016   Rib fractures 09/17/2016   Multiple falls 09/17/2016   Severe muscle deconditioning 09/17/2016   Failure to thrive in adult 09/17/2016   Melena 04/25/2016   Transient hypotension 04/25/2016   Anxiety state 04/25/2016   Tobacco abuse 04/25/2016   Hyperlipidemia 04/25/2016   Syncope 04/24/2016   Near  syncope 04/24/2016   Gait difficulty 01/12/2016   Foot drop, left 01/12/2016   Severe recurrent major depression without psychotic features ([place]) 08/28/2015   Spondylolisthesis at L4-L5 level 07/30/2015   PVD (peripheral vascular disease) ([place]) 07/29/2014   Weakness-Bilateral arm/leg 07/29/2014   Numbness-left leg 07/29/2014   Swelling of  limb-Legs 07/29/2014   Atherosclerosis of native arteries of the extremities with intermittent claudication 09/30/2011    Orientation RESPIRATION BLADDER Height & Weight     Self, Time, Situation, Place  O2 (2L Nasal Cannula) Incontinent, External catheter Weight: 95 lb 14.4 oz (43.5 kg) Height:  5\' 3"  (160 cm)  BEHAVIORAL SYMPTOMS/MOOD NEUROLOGICAL BOWEL NUTRITION STATUS      Continent Diet (See Dc Summary)  AMBULATORY STATUS COMMUNICATION OF NEEDS Skin   Limited Assist Verbally Normal                       Personal Care Assistance Level of Assistance  Bathing, Dressing, Feeding           Functional Limitations Info  Sight, Hearing, Speech Sight Info: Adequate Hearing Info: Adequate Speech Info: Adequate    SPECIAL CARE FACTORS FREQUENCY  PT (By licensed PT), OT (By licensed OT)                    Contractures Contractures Info: Not present    Additional Factors Info  Code Status, Allergies               Current Medications (10/09/2021):  This is the current hospital active medication list Current Facility-Administered Medications  Medication Dose Route Frequency Provider Last Rate Last Admin   0.9 %  sodium chloride infusion  250 mL Intravenous Continuous Ogan, Kerry Kass, MD       acetaminophen (TYLENOL) tablet 1,000 mg  1,000 mg Oral Q8H Knox Royalty, NP   1,000 mg at 10/09/21 0550   amLODipine (NORVASC) tablet 10 mg  10 mg Oral Daily Josue Hector, MD   10 mg at 10/09/21 2585   diclofenac Sodium (VOLTAREN) 1 % topical gel 2 g  2 g Topical Q6H PRN Swayze, Ava, DO       docusate sodium (COLACE) capsule 100 mg  100 mg Oral BID PRN Deland Pretty, MD   100 mg at 09/28/21 0913   feeding supplement (NEPRO CARB STEADY) liquid 237 mL  237 mL Oral BID BM Swayze, Ava, DO   237 mL at 10/08/21 1410   heparin injection 5,000 Units  5,000 Units Subcutaneous Q8H Deland Pretty, MD   5,000 Units at 10/09/21 0550   hydrALAZINE (APRESOLINE)  tablet 25 mg  25 mg Oral Q6H PRN Swayze, Ava, DO   25 mg at 10/02/21 0742   hydrALAZINE (APRESOLINE) tablet 50 mg  50 mg Oral Q8H Swayze, Ava, DO   50 mg at 10/09/21 0550   HYDROmorphone (DILAUDID) tablet 2-4 mg  2-4 mg Oral Q3H PRN Vance Gather B, MD   2 mg at 10/08/21 2225   lidocaine (LIDODERM) 5 % 1 patch  1 patch Transdermal Q24H Spero Geralds, MD   1 patch at 10/08/21 1409   multivitamin (RENA-VIT) tablet 1 tablet  1 tablet Oral QHS Swayze, Ava, DO   1 tablet at 10/08/21 2225   ondansetron (ZOFRAN) injection 4 mg  4 mg Intravenous Q6H PRN Deland Pretty, MD       pantoprazole (PROTONIX) EC tablet 40 mg  40 mg Oral QHS [place], Gagan  S, MD   40 mg at 10/08/21 2225   polyethylene glycol (MIRALAX / GLYCOLAX) packet 17 g  17 g Oral BID Oswald Hillock, MD   17 g at 10/05/21 0903   pregabalin (LYRICA) capsule 75 mg  75 mg Oral Daily Margaretha Seeds, MD   75 mg at 10/09/21 8882   sodium bicarbonate tablet 1,300 mg  1,300 mg Oral TID Edrick Oh, MD   1,300 mg at 10/09/21 0905   sodium zirconium cyclosilicate (LOKELMA) packet 10 g  10 g Oral Jarome Matin, MD   10 g at 10/08/21 1557     Discharge Medications: Please see discharge summary for a list of discharge medications.  Relevant Imaging Results:  Relevant Lab Results:   Additional Information ss#[id number]. COVID vaccinations: 2/23, 3/16, 10/12/20  Reece Agar, LCSWA

## 2021-10-09 NOTE — Plan of Care (Signed)
Continue newly initiated low K diet  Lokelma once today and three times a week on discharge  NS 250 mL once as well to aid in elimination of K  Other recommendations as per my note on 12/22.  Stable from a renal standpoint   Nephrology will sign off.   Claudia Desanctis, MD 5:40 AM 10/09/2021

## 2021-10-09 NOTE — TOC Transition Note (Signed)
Transition of Care (TOC) - CM/SW Discharge Note Marvetta Gibbons RN, BSN Transitions of Care Unit 4E- RN Case Manager See Treatment Team for direct phone #  Cross Coverage  Patient Details  Name: Alyssa [name] MRN: [id number] Date of Birth: [birth year]  [facility]) CM/SW Contact:  Dawayne Shalin, RN Phone Number: 10/09/2021, 2:06 PM   Clinical Narrative:    Pt for transition home today, plans changed to Home w/ [place], orders have been placed. Notified and confirmed with [facility] that [facility] services have been accepted and will start post discharge (spoke with Levada Dy)   Final next level of care: [place] Barriers to Discharge: Barriers Resolved   Patient Goals and CMS Choice Patient states their goals for this hospitalization and ongoing recovery are:: home w/ [facility] CMS Medicare.gov Compare Post Acute Care list provided to:: Patient Choice offered to / list presented to : Patient  Discharge Placement               Home w/ [facility]        Discharge Plan and Services In-house Referral: Clinical Social Work   Post Acute Care Choice: Skilled Nursing Facility                    HH Arranged: RN, PT, OT, Nurse's Aide, Social Work [facility] Agency: [facility] Date [place] Agency Contacted: 10/09/21 Time [place]: [place] Representative spoke with at [place]: [place] (SDOH) Interventions     Readmission Risk Interventions Readmission Risk Prevention Plan 10/09/2021 07/10/2020  Transportation Screening Complete Complete  Medication Review Press photographer) Complete Complete  PCP or Specialist appointment within 3-5 days of discharge Complete -  [place] or [place] Complete Complete  SW Recovery Care/Counseling Consult Complete Complete  Palliative Care Screening Not Applicable Not [place] Not Applicable Not Applicable  Some recent data might be hidden

## 2021-10-09 NOTE — TOC Initial Note (Addendum)
Transition of Care [facility]) - Initial/Assessment Note    Patient Details  Name: Alyssa [name] MRN: [id number] Date of Birth: 12-12-1940  Transition of Care [facility]) CM/SW Contact:    Tresa Endo Phone Number: 10/09/2021, 11:42 AM  Clinical Narrative:                 CSW received SNF consult. CSW met with pt via phone. CSW introduced self and explained role at the hospital. Pt reports that PTA the pt lived at home with family. PT reports pt used a 4 wheeled rollator, pt walked 120 ft with min assist. PT reports generalized weakness, decreased activity and needs assistance to maintain stability with mobility and ADL tasks. Pt click score is 19.  CSW reviewed PT/OT recommendations for SNF. Pt reports understanding the need for SNF. Pt gave CSW permission to fax out to facilities in the area. Pt has no preference of facility at this time. CSW gave pt medicare.gov rating list to review. Pt is no longer Navi managed.   CSW will continue to follow.    Expected Discharge Plan: Skilled Nursing Facility Barriers to Discharge: Barriers Resolved   Patient Goals and CMS Choice Patient states their goals for this hospitalization and ongoing recovery are:: Rehab CMS Medicare.gov Compare Post Acute Care list provided to:: Patient Choice offered to / list presented to : Patient  Expected Discharge Plan and Services Expected Discharge Plan: [place] In-house Referral: Clinical Social Work   Post Acute Care Choice: [place] Living arrangements for the past 2 months: [place] Expected Discharge Date: 10/09/21                                    Prior Living Arrangements/Services Living arrangements for the past 2 months: Single Family Home Lives with:: Adult Children Patient language and need for interpreter reviewed:: Yes Do you feel safe going back to the place where you live?: Yes      Need for Family Participation in Patient  Care: Yes (Comment) Care giver support system in place?: Yes (comment)   Criminal Activity/Legal Involvement Pertinent to Current Situation/Hospitalization: No - Comment as needed  Activities of Daily Living Home Assistive Devices/Equipment: Shower chair with back, Walker (specify type), Bedside commode/3-in-1, Dentures (specify type), Blood pressure cuff, Raised toilet seat with rails, Grab bars in shower ADL Screening (condition at time of admission) Patient's cognitive ability adequate to safely complete daily activities?: Yes Is the patient deaf or have difficulty hearing?: No Does the patient have difficulty seeing, even when wearing glasses/contacts?: No Does the patient have difficulty concentrating, remembering, or making decisions?: No Patient able to express need for assistance with ADLs?: Yes Does the patient have difficulty dressing or bathing?: No Independently performs ADLs?: Yes (appropriate for developmental age) Does the patient have difficulty walking or climbing stairs?: Yes Weakness of Legs: Both Weakness of Arms/Hands: Both  Permission Sought/Granted Permission sought to share information with : Family Supports, Chartered certified accountant granted to share information with : Yes, Verbal Permission Granted  Share Information with NAME: Ameena, Vesey)   [phone]  Permission granted to share info w AGENCY: SNF  Permission granted to share info w Relationship: Kilea, Mccarey (Son)   [phone]  Permission granted to share info w Contact Information: Shian, [place])   [phone]  Emotional Assessment Appearance:: Appears stated age Attitude/Demeanor/Rapport: Engaged Affect (typically observed): Appropriate, Accepting Orientation: : Oriented  to Self, Oriented to Place, Oriented to  Time, Oriented to Situation Alcohol / Substance Use: Not Applicable Psych Involvement: No (comment)  Admission diagnosis:  Hyperkalemia  [E87.5] Bradycardia [R00.1] Acute kidney injury ([place]) [N17.9] Bradycardia with 31-40 beats per minute [R00.1] Patient Active Problem List   Diagnosis Date Noted   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, implant, and graft 09/29/2021   Bradycardia with 31-40 beats per minute 09/26/2021   Hypertensive crisis 09/10/2021   Rectal bleeding    Rectal pain    Malnutrition of moderate degree 06/17/2021   Weight loss 06/16/2021   Zoster 06/16/2021   Collapsed lung    COPD  GOLD 0/ still smoking  09/26/2020   Cigarette smoker 09/26/2020   Shortness of breath 07/06/2020   Essential hypertension 07/06/2020   Abdominal pain 07/06/2020   Elevated INR 07/06/2020   Acute respiratory failure due to COVID-19 ([place]) 06/02/2020   Acute renal failure superimposed on chronic kidney disease ([place]) 02/06/2020   Leukocytosis 02/06/2020   Thrombocytosis 02/06/2020   Palliative care encounter    Constipation 01/05/2020   Protein-calorie malnutrition, severe ([place]) 01/04/2020   Acute on chronic renal failure ([place]) 01/04/2020   Hyperkalemia 01/03/2020   AKI (acute kidney injury) ([place]) 01/03/2020   Acute respiratory failure with hypoxia ([place]) 11/01/2019   UTI (urinary tract infection) 11/01/2019   CAP (community acquired pneumonia) 11/01/2019   Acute cystitis without hematuria    Encephalopathy 09/18/2019   SAH (subarachnoid hemorrhage) ([place]) 09/18/2019   Dementia ([place]) 70/26/3785   Metabolic acidosis 88/50/2774   ARF (acute renal failure) ([place]) 09/17/2019   Acute encephalopathy 09/17/2019   Abnormal CT scan, colon    Diarrhea    Diverticulitis 06/07/2019   Depression with anxiety 06/07/2019   DNR (do not resuscitate) 03/23/2019   Volume overload 03/23/2019   Edema 03/23/2019   Acute renal failure (ARF) ([place]) 03/08/2019   Hypokalemia 03/08/2019   Palliative care by specialist    Goals of care, counseling/discussion    Altered mental status    Encephalopathy, toxic 12/24/2018   Aspiration  pneumonia of both lower lobes due to gastric secretions ([place]) 12/24/2018   Non-ST elevation (NSTEMI) myocardial infarction [facility])    Chest pain    Dyspnea on exertion    Elevated troponin level 12/09/2018   Chronic migraine 03/27/2018   Lumbar radiculopathy 03/27/2018   Gait abnormality 03/27/2018   Hypertensive urgency 12/28/2016   CKD (chronic kidney disease) stage 4, GFR 15-29 ml/min ([place]) 12/28/2016   Anemia 12/28/2016   Chronic pain syndrome 12/28/2016   Benzodiazepine withdrawal without complication ([place]) 12/87/8676   Chronic right shoulder pain 12/06/2016   Fall 09/17/2016   Fracture of humeral head, closed, right, initial encounter 09/17/2016   Rib fractures 09/17/2016   Multiple falls 09/17/2016   Severe muscle deconditioning 09/17/2016   Failure to thrive in adult 09/17/2016   Melena 04/25/2016   Transient hypotension 04/25/2016   Anxiety state 04/25/2016   Tobacco abuse 04/25/2016   Hyperlipidemia 04/25/2016   Syncope 04/24/2016   Near syncope 04/24/2016   Gait difficulty 01/12/2016   Foot drop, left 01/12/2016   Severe recurrent major depression without psychotic features ([place]) 08/28/2015   Spondylolisthesis at L4-L5 level 07/30/2015   PVD (peripheral vascular disease) ([place]) 07/29/2014   Weakness-Bilateral arm/leg 07/29/2014   Numbness-left leg 07/29/2014   Swelling of limb-Legs 07/29/2014   Atherosclerosis of native arteries of the extremities with intermittent claudication 09/30/2011   PCP:  System, Provider Not In Pharmacy:   [facility]  [place], [place] [address] [place] [address] Phone: [phone] Fax: [phone]     Social Determinants of Health (SDOH) Interventions    Readmission Risk Interventions Readmission Risk Prevention Plan 07/10/2020  Transportation Screening Complete  Medication Review Press photographer) Complete  HRI or [place]  Complete  SW Recovery Care/Counseling Consult Complete  Palliative Care Screening Not [place] Not Applicable  Some recent data might be hidden

## 2021-10-09 NOTE — Care Management Important Message (Signed)
Important Message  Patient Details  Name: Alyssa [name] MRN: [id number] Date of Birth: [birth year]   Medicare Important Message Given:  Yes     Orbie Pyo 10/09/2021, 4:05 PM

## 2021-10-09 NOTE — Discharge Instructions (Signed)
You should avoid/minimize any foods or drinks that are high in potassium. These include:  Bananas, oranges, cantaloupe, honeydew, apricots, grapefruit, prunes, raisins Cooked spinach Cooked broccoli Potatoes Sweet potatoes Mushrooms Peas Cucumbers Zucchini Pumpkins Leafy greens  Orange juice Tomato juice Prune juice Apricot juice Grapefruit juice Certain dairy products, such as milk and yogurt, are high in potassium

## 2021-10-09 NOTE — Progress Notes (Addendum)
Mobility Specialist Progress Note    10/09/21 0953  Mobility  Activity Ambulated in hall  Level of Assistance Standby assist, set-up cues, supervision of patient - no hands on  Assistive Device Four wheel walker  Distance Ambulated (ft) 220 ft  Mobility Ambulated with assistance in hallway  Mobility Response Tolerated fair  Mobility performed by Mobility specialist  $Mobility charge 1 Mobility   Pre-mobility: 98 HR, 142/79 BP Post-mobility: 109 HR  Pt received in bed and agreeable. C/o 9/10 pain. Ambulated without a rest break. Returned to bed with call bell in reach. RN notified about pain.   [facility] Mobility Specialist  M.S. Primary Phone: [phone] M.S. Secondary Phone: [phone]

## 2021-10-09 NOTE — TOC Transition Note (Addendum)
Transition of Care [facility]) - CM/SW Discharge Note   Patient Details  Name: Alyssa [name] MRN: [id number] Date of Birth: 1941/03/25  [name]) CM/SW Contact:  Zenon Mayo, RN Phone Number: 10/09/2021, 8:47 AM   Clinical Narrative:    Patient is for dc today, NCM spoke with son, he states he wanted a SNF for her.  NCM informed him that per physical therapy eval, they did rec SNF so will get the CSW involved.   She will need to be worked up for SNF.         Patient Goals and CMS Choice        Discharge Placement                       Discharge Plan and Services                                     Social Determinants of Health (SDOH) Interventions     Readmission Risk Interventions Readmission Risk Prevention Plan 07/10/2020  Transportation Screening Complete  Medication Review ([place]) Complete  HRI or [place] Complete  SW Recovery Care/Counseling Consult Complete  Palliative Care Screening Not [place] Not Applicable  Some recent data might be hidden

## 2021-10-20 ENCOUNTER — Encounter: Payer: Self-pay | Admitting: Physician Assistant

## 2021-10-20 ENCOUNTER — Other Ambulatory Visit: Payer: Self-pay

## 2021-10-20 ENCOUNTER — Ambulatory Visit (INDEPENDENT_AMBULATORY_CARE_PROVIDER_SITE_OTHER): Payer: Medicare HMO | Admitting: Physician Assistant

## 2021-10-20 VITALS — BP 160/100 | HR 102 | Ht 63.0 in | Wt 88.4 lb

## 2021-10-20 DIAGNOSIS — I2510 Atherosclerotic heart disease of native coronary artery without angina pectoris: Secondary | ICD-10-CM

## 2021-10-20 DIAGNOSIS — I10 Essential (primary) hypertension: Secondary | ICD-10-CM

## 2021-10-20 DIAGNOSIS — R001 Bradycardia, unspecified: Secondary | ICD-10-CM

## 2021-10-20 DIAGNOSIS — R000 Tachycardia, unspecified: Secondary | ICD-10-CM

## 2021-10-20 DIAGNOSIS — J449 Chronic obstructive pulmonary disease, unspecified: Secondary | ICD-10-CM

## 2021-10-20 DIAGNOSIS — E875 Hyperkalemia: Secondary | ICD-10-CM | POA: Diagnosis not present

## 2021-10-20 DIAGNOSIS — R630 Anorexia: Secondary | ICD-10-CM

## 2021-10-20 NOTE — Patient Instructions (Signed)
Medication Instructions:  No Changes *If you need a refill on your cardiac medications before your next appointment, please call your pharmacy*   Lab Work: No Labs If you have labs (blood work) drawn today and your tests are completely normal, you will receive your results only by: [place] (if you have MyChart) OR A paper copy in the mail If you have any lab test that is abnormal or we need to change your treatment, we will call you to review the results.   Testing/Procedures: No Testing   Follow-Up: At [facility], you and your health needs are our priority.  As part of our continuing mission to provide you with exceptional heart care, we have created designated Provider Care Teams.  These Care Teams include your primary Cardiologist (physician) and Advanced Practice Providers (APPs -  Physician Assistants and Nurse Practitioners) who all work together to provide you with the care you need, when you need it.  We recommend signing up for the patient portal called "MyChart".  Sign up information is provided on this After Visit Summary.  MyChart is used to connect with patients for Virtual Visits (Telemedicine).  Patients are able to view lab/test results, encounter notes, upcoming appointments, etc.  Non-urgent messages can be sent to your provider as well.   To learn more about what you can do with MyChart, go to [email or web address].    Your next appointment:   2 month(s)  The format for your next appointment:   In Person  Provider:   Shelva Majestic, MD

## 2021-10-21 ENCOUNTER — Emergency Department (HOSPITAL_COMMUNITY): Payer: Medicare Other

## 2021-10-21 ENCOUNTER — Other Ambulatory Visit: Payer: Self-pay

## 2021-10-21 ENCOUNTER — Observation Stay (HOSPITAL_COMMUNITY)
Admission: EM | Admit: 2021-10-21 | Discharge: 2021-10-24 | Disposition: A | Discharge status: Home / Self Care | Payer: Medicare Other | Attending: Internal Medicine | Admitting: Internal Medicine

## 2021-10-21 DIAGNOSIS — N184 Chronic kidney disease, stage 4 (severe): Secondary | ICD-10-CM | POA: Diagnosis present

## 2021-10-21 DIAGNOSIS — T81718A Complication of other artery following a procedure, not elsewhere classified, initial encounter: Secondary | ICD-10-CM | POA: Insufficient documentation

## 2021-10-21 DIAGNOSIS — I10 Essential (primary) hypertension: Secondary | ICD-10-CM | POA: Diagnosis present

## 2021-10-21 DIAGNOSIS — F17210 Nicotine dependence, cigarettes, uncomplicated: Secondary | ICD-10-CM | POA: Diagnosis not present

## 2021-10-21 DIAGNOSIS — N185 Chronic kidney disease, stage 5: Secondary | ICD-10-CM | POA: Insufficient documentation

## 2021-10-21 DIAGNOSIS — Z7982 Long term (current) use of aspirin: Secondary | ICD-10-CM | POA: Diagnosis not present

## 2021-10-21 DIAGNOSIS — G894 Chronic pain syndrome: Secondary | ICD-10-CM | POA: Diagnosis present

## 2021-10-21 DIAGNOSIS — I724 Aneurysm of artery of lower extremity: Secondary | ICD-10-CM | POA: Diagnosis not present

## 2021-10-21 DIAGNOSIS — Z79899 Other long term (current) drug therapy: Secondary | ICD-10-CM | POA: Diagnosis not present

## 2021-10-21 DIAGNOSIS — Z66 Do not resuscitate: Secondary | ICD-10-CM | POA: Diagnosis present

## 2021-10-21 DIAGNOSIS — F0390 Unspecified dementia without behavioral disturbance: Secondary | ICD-10-CM | POA: Diagnosis present

## 2021-10-21 DIAGNOSIS — I120 Hypertensive chronic kidney disease with stage 5 chronic kidney disease or end stage renal disease: Secondary | ICD-10-CM | POA: Insufficient documentation

## 2021-10-21 DIAGNOSIS — I729 Aneurysm of unspecified site: Secondary | ICD-10-CM

## 2021-10-21 DIAGNOSIS — Z20822 Contact with and (suspected) exposure to covid-19: Secondary | ICD-10-CM | POA: Diagnosis not present

## 2021-10-21 DIAGNOSIS — N3000 Acute cystitis without hematuria: Secondary | ICD-10-CM | POA: Insufficient documentation

## 2021-10-21 DIAGNOSIS — J449 Chronic obstructive pulmonary disease, unspecified: Secondary | ICD-10-CM | POA: Diagnosis not present

## 2021-10-21 DIAGNOSIS — R109 Unspecified abdominal pain: Secondary | ICD-10-CM | POA: Diagnosis present

## 2021-10-21 DIAGNOSIS — E875 Hyperkalemia: Secondary | ICD-10-CM | POA: Diagnosis present

## 2021-10-21 DIAGNOSIS — J441 Chronic obstructive pulmonary disease with (acute) exacerbation: Secondary | ICD-10-CM | POA: Diagnosis present

## 2021-10-21 DIAGNOSIS — I739 Peripheral vascular disease, unspecified: Secondary | ICD-10-CM | POA: Diagnosis present

## 2021-10-21 LAB — CBC WITH DIFFERENTIAL/PLATELET
Abs Immature Granulocytes: 0.02 10*3/uL (ref 0.00–0.07)
Basophils Absolute: 0.1 10*3/uL (ref 0.0–0.1)
Basophils Relative: 1 %
Eosinophils Absolute: 0.1 10*3/uL (ref 0.0–0.5)
Eosinophils Relative: 1 %
HCT: 31.6 % — ABNORMAL LOW (ref 36.0–46.0)
Hemoglobin: 10.2 g/dL — ABNORMAL LOW (ref 12.0–15.0)
Immature Granulocytes: 0 %
Lymphocytes Relative: 33 %
Lymphs Abs: 2.8 10*3/uL (ref 0.7–4.0)
MCH: 27.6 pg (ref 26.0–34.0)
MCHC: 32.3 g/dL (ref 30.0–36.0)
MCV: 85.6 fL (ref 80.0–100.0)
Monocytes Absolute: 1 10*3/uL (ref 0.1–1.0)
Monocytes Relative: 12 %
Neutro Abs: 4.4 10*3/uL (ref 1.7–7.7)
Neutrophils Relative %: 53 %
Platelets: 545 10*3/uL — ABNORMAL HIGH (ref 150–400)
RBC: 3.69 MIL/uL — ABNORMAL LOW (ref 3.87–5.11)
RDW: 18.2 % — ABNORMAL HIGH (ref 11.5–15.5)
WBC: 8.5 10*3/uL (ref 4.0–10.5)
nRBC: 0 % (ref 0.0–0.2)

## 2021-10-21 LAB — COMPREHENSIVE METABOLIC PANEL
ALT: 22 U/L (ref 0–44)
AST: 33 U/L (ref 15–41)
Albumin: 4 g/dL (ref 3.5–5.0)
Alkaline Phosphatase: 95 U/L (ref 38–126)
Anion gap: 15 (ref 5–15)
BUN: 27 mg/dL — ABNORMAL HIGH (ref 8–23)
CO2: 21 mmol/L — ABNORMAL LOW (ref 22–32)
Calcium: 9.9 mg/dL (ref 8.9–10.3)
Chloride: 102 mmol/L (ref 98–111)
Creatinine, Ser: 2.49 mg/dL — ABNORMAL HIGH (ref 0.44–1.00)
GFR, Estimated: 19 mL/min — ABNORMAL LOW (ref >60)
Glucose, Bld: 97 mg/dL (ref 70–99)
Potassium: 3.8 mmol/L (ref 3.5–5.1)
Sodium: 138 mmol/L (ref 135–145)
Total Bilirubin: 0.6 mg/dL (ref 0.3–1.2)
Total Protein: 7.4 g/dL (ref 6.5–8.1)

## 2021-10-21 LAB — LIPASE, BLOOD: Lipase: 21 U/L (ref 11–51)

## 2021-10-21 LAB — LACTIC ACID, PLASMA: Lactic Acid, Venous: 1.3 mmol/L (ref 0.5–1.9)

## 2021-10-21 NOTE — ED Provider Triage Note (Signed)
Emergency Medicine Provider Triage Evaluation Note  Alyssa [name] , a 81 y.o. female  was evaluated in triage.  Pt complains of left flank pain associated with nausea x4 days. No history of kidney stones. Admits to decreased urinary output. She also had a right-sided inguinal hernia that has had increased pain over the past few days. Denies vomiting. EMS reported patient to be febrile.   Review of Systems  Positive: Flank pain, abdominal pain, nausea Negative: Vomiting.   Physical Exam  BP (!) 158/107 (BP Location: Right Arm)    Pulse (!) 103    Temp 98.2 F (36.8 C) (Oral)    Resp (!) 22    SpO2 100%  Gen:   Awake, no distress   Resp:  Normal effort  MSK:   Moves extremities without difficulty  Other:    Medical Decision Making  Medically screening exam initiated at 8:57 PM.  Appropriate orders placed.  Alyssa [name] was informed that the remainder of the evaluation will be completed by another provider, this initial triage assessment does not replace that evaluation, and the importance of remaining in the ED until their evaluation is complete.  Abdominal labs CT abdomen, unable to obtain CT with contrast due to GFR.    Suzy Bouchard, [place] 10/21/21 2059

## 2021-10-21 NOTE — ED Triage Notes (Signed)
Pt here via [facility] from home for L flank pain w/ nausea and decreased urinary output x4 days, but  denies vomiting. Pt also c/o of R side hernia that has been getting bigger and more painful.   100.23F, 20RR, 100HR, 162/94, CBG 120, 99% RA

## 2021-10-22 ENCOUNTER — Encounter (HOSPITAL_COMMUNITY): Payer: Self-pay | Admitting: Internal Medicine

## 2021-10-22 ENCOUNTER — Emergency Department (HOSPITAL_COMMUNITY): Payer: Medicare Other

## 2021-10-22 ENCOUNTER — Emergency Department (HOSPITAL_BASED_OUTPATIENT_CLINIC_OR_DEPARTMENT_OTHER): Payer: Medicare Other

## 2021-10-22 DIAGNOSIS — I739 Peripheral vascular disease, unspecified: Secondary | ICD-10-CM | POA: Diagnosis not present

## 2021-10-22 DIAGNOSIS — I724 Aneurysm of artery of lower extremity: Secondary | ICD-10-CM | POA: Diagnosis not present

## 2021-10-22 DIAGNOSIS — T81718A Complication of other artery following a procedure, not elsewhere classified, initial encounter: Secondary | ICD-10-CM | POA: Diagnosis present

## 2021-10-22 DIAGNOSIS — R109 Unspecified abdominal pain: Secondary | ICD-10-CM | POA: Diagnosis not present

## 2021-10-22 LAB — RESP PANEL BY RT-PCR (FLU A&B, COVID) ARPGX2
Influenza A by PCR: NEGATIVE
Influenza B by PCR: NEGATIVE
SARS Coronavirus 2 by RT PCR: NEGATIVE

## 2021-10-22 LAB — URINALYSIS, ROUTINE W REFLEX MICROSCOPIC
Bilirubin Urine: NEGATIVE
Glucose, UA: NEGATIVE mg/dL
Hgb urine dipstick: NEGATIVE
Ketones, ur: 5 mg/dL — AB
Nitrite: NEGATIVE
Protein, ur: >=300 mg/dL — AB
Specific Gravity, Urine: 1.013 (ref 1.005–1.030)
WBC, UA: >50 WBC/hpf — ABNORMAL HIGH (ref 0–5)
pH: 6 (ref 5.0–8.0)

## 2021-10-22 MED ORDER — HYDRALAZINE HCL 20 MG/ML IJ SOLN: 5.0000 mg | INTRAMUSCULAR | Status: DC | PRN

## 2021-10-22 MED ORDER — ROSUVASTATIN CALCIUM 20 MG PO TABS
40.0000 mg | ORAL_TABLET | Freq: Every day | ORAL | Status: DC
  Administered 2021-10-22 – 2021-10-24 (×3): 40 mg via ORAL
  Filled 2021-10-22 (×3): qty 2

## 2021-10-22 MED ORDER — ALBUTEROL SULFATE (2.5 MG/3ML) 0.083% IN NEBU: 2.5000 mg | INHALATION_SOLUTION | RESPIRATORY_TRACT | Status: DC | PRN

## 2021-10-22 MED ORDER — FLUTICASONE FUROATE-VILANTEROL 100-25 MCG/ACT IN AEPB
1.0000 | INHALATION_SPRAY | Freq: Every day | RESPIRATORY_TRACT | Status: DC
  Administered 2021-10-22 – 2021-10-24 (×3): 1 via RESPIRATORY_TRACT
  Filled 2021-10-22: qty 28

## 2021-10-22 MED ORDER — HYDROXYZINE HCL 25 MG PO TABS
25.0000 mg | ORAL_TABLET | Freq: Every day | ORAL | Status: DC
  Administered 2021-10-22 – 2021-10-23 (×2): 25 mg via ORAL
  Filled 2021-10-22 (×2): qty 1

## 2021-10-22 MED ORDER — LORAZEPAM 0.5 MG PO TABS: 0.5000 mg | ORAL_TABLET | ORAL | Status: DC | PRN

## 2021-10-22 MED ORDER — CLOPIDOGREL BISULFATE 75 MG PO TABS
75.0000 mg | ORAL_TABLET | Freq: Every day | ORAL | Status: DC
  Administered 2021-10-22 – 2021-10-24 (×3): 75 mg via ORAL
  Filled 2021-10-22 (×3): qty 1

## 2021-10-22 MED ORDER — DICYCLOMINE HCL 20 MG PO TABS
20.0000 mg | ORAL_TABLET | Freq: Three times a day (TID) | ORAL | Status: DC
  Administered 2021-10-22 – 2021-10-24 (×5): 20 mg via ORAL
  Filled 2021-10-22 (×5): qty 1

## 2021-10-22 MED ORDER — SODIUM CHLORIDE 0.9 % IV SOLN
1.0000 g | Freq: Once | INTRAVENOUS | Status: AC
  Administered 2021-10-22: 1 g via INTRAVENOUS
  Filled 2021-10-22: qty 10

## 2021-10-22 MED ORDER — METHOCARBAMOL 500 MG PO TABS: 500.0000 mg | ORAL_TABLET | Freq: Three times a day (TID) | ORAL | Status: DC | PRN

## 2021-10-22 MED ORDER — SODIUM BICARBONATE 650 MG PO TABS
1300.0000 mg | ORAL_TABLET | Freq: Two times a day (BID) | ORAL | Status: DC
  Administered 2021-10-22 – 2021-10-24 (×4): 1300 mg via ORAL
  Filled 2021-10-22 (×4): qty 2

## 2021-10-22 MED ORDER — ASPIRIN 81 MG PO CHEW
81.0000 mg | CHEWABLE_TABLET | Freq: Every day | ORAL | Status: DC
  Administered 2021-10-22 – 2021-10-24 (×3): 81 mg via ORAL
  Filled 2021-10-22 (×3): qty 1

## 2021-10-22 MED ORDER — HYDROMORPHONE HCL 2 MG PO TABS
1.0000 mg | ORAL_TABLET | ORAL | Status: DC | PRN
  Administered 2021-10-23: 1 mg via ORAL
  Filled 2021-10-22: qty 1

## 2021-10-22 MED ORDER — HYDRALAZINE HCL 50 MG PO TABS
50.0000 mg | ORAL_TABLET | Freq: Three times a day (TID) | ORAL | Status: DC
  Administered 2021-10-22 – 2021-10-24 (×5): 50 mg via ORAL
  Filled 2021-10-22 (×3): qty 1
  Filled 2021-10-22: qty 2
  Filled 2021-10-22: qty 1

## 2021-10-22 MED ORDER — LACTATED RINGERS IV SOLN: INTRAVENOUS | Status: DC

## 2021-10-22 MED ORDER — PREGABALIN 75 MG PO CAPS
75.0000 mg | ORAL_CAPSULE | Freq: Every day | ORAL | Status: DC
  Administered 2021-10-22 – 2021-10-24 (×3): 75 mg via ORAL
  Filled 2021-10-22: qty 1
  Filled 2021-10-22 (×2): qty 3

## 2021-10-22 MED ORDER — POLYETHYLENE GLYCOL 3350 17 G PO PACK: 17.0000 g | PACK | Freq: Every day | ORAL | Status: DC | PRN

## 2021-10-22 MED ORDER — NICOTINE 7 MG/24HR TD PT24
7.0000 mg | MEDICATED_PATCH | Freq: Every day | TRANSDERMAL | Status: DC | PRN
  Administered 2021-10-22: 7 mg via TRANSDERMAL
  Filled 2021-10-22: qty 1

## 2021-10-22 MED ORDER — ACETAMINOPHEN 325 MG PO TABS: 650.0000 mg | ORAL_TABLET | Freq: Four times a day (QID) | ORAL | Status: DC | PRN

## 2021-10-22 MED ORDER — AMITRIPTYLINE HCL 10 MG PO TABS
10.0000 mg | ORAL_TABLET | Freq: Every day | ORAL | Status: DC
  Administered 2021-10-22 – 2021-10-24 (×3): 10 mg via ORAL
  Filled 2021-10-22 (×3): qty 1

## 2021-10-22 MED ORDER — VALPROIC ACID 250 MG PO CAPS
250.0000 mg | ORAL_CAPSULE | Freq: Every day | ORAL | Status: DC
  Administered 2021-10-22 – 2021-10-23 (×2): 250 mg via ORAL
  Filled 2021-10-22 (×3): qty 1

## 2021-10-22 MED ORDER — ACETAMINOPHEN 650 MG RE SUPP: 650.0000 mg | Freq: Four times a day (QID) | RECTAL | Status: DC | PRN

## 2021-10-22 MED ORDER — ONDANSETRON HCL 4 MG PO TABS: 4.0000 mg | ORAL_TABLET | Freq: Four times a day (QID) | ORAL | Status: DC | PRN

## 2021-10-22 MED ORDER — AMLODIPINE BESYLATE 10 MG PO TABS
10.0000 mg | ORAL_TABLET | Freq: Every day | ORAL | Status: DC
  Administered 2021-10-22 – 2021-10-24 (×3): 10 mg via ORAL
  Filled 2021-10-22 (×2): qty 2
  Filled 2021-10-22: qty 1

## 2021-10-22 MED ORDER — SODIUM ZIRCONIUM CYCLOSILICATE 10 G PO PACK: 10.0000 g | PACK | ORAL | Status: DC

## 2021-10-22 MED ORDER — ENOXAPARIN SODIUM 30 MG/0.3ML IJ SOSY
30.0000 mg | PREFILLED_SYRINGE | INTRAMUSCULAR | Status: DC
  Administered 2021-10-22 – 2021-10-23 (×2): 30 mg via SUBCUTANEOUS
  Filled 2021-10-22 (×2): qty 0.3

## 2021-10-22 MED ORDER — UMECLIDINIUM BROMIDE 62.5 MCG/ACT IN AEPB
1.0000 | INHALATION_SPRAY | Freq: Every day | RESPIRATORY_TRACT | Status: DC
  Administered 2021-10-22 – 2021-10-24 (×3): 1 via RESPIRATORY_TRACT
  Filled 2021-10-22: qty 7

## 2021-10-22 MED ORDER — MAGNESIUM OXIDE -MG SUPPLEMENT 400 (240 MG) MG PO TABS
400.0000 mg | ORAL_TABLET | Freq: Two times a day (BID) | ORAL | Status: DC
  Administered 2021-10-22 – 2021-10-24 (×4): 400 mg via ORAL
  Filled 2021-10-22 (×4): qty 1

## 2021-10-22 MED ORDER — BISACODYL 5 MG PO TBEC: 5.0000 mg | DELAYED_RELEASE_TABLET | Freq: Every day | ORAL | Status: DC | PRN

## 2021-10-22 MED ORDER — ONDANSETRON HCL 4 MG/2ML IJ SOLN
4.0000 mg | Freq: Four times a day (QID) | INTRAMUSCULAR | Status: DC | PRN
Start: 2021-10-22 — End: 2021-10-24

## 2021-10-22 MED ORDER — DOCUSATE SODIUM 100 MG PO CAPS
100.0000 mg | ORAL_CAPSULE | Freq: Every day | ORAL | Status: DC
  Administered 2021-10-22 – 2021-10-24 (×3): 100 mg via ORAL
  Filled 2021-10-22 (×3): qty 1

## 2021-10-22 MED ORDER — MAGNESIUM OXIDE 400 MG PO TABS
400.0000 mg | ORAL_TABLET | Freq: Two times a day (BID) | ORAL | Status: DC
  Filled 2021-10-22: qty 1

## 2021-10-22 MED ORDER — BUDESON-GLYCOPYRROL-FORMOTEROL 160-9-4.8 MCG/ACT IN AERO: 2.0000 | INHALATION_SPRAY | Freq: Two times a day (BID) | RESPIRATORY_TRACT | Status: DC

## 2021-10-22 MED ORDER — SODIUM CHLORIDE 0.9% FLUSH
3.0000 mL | Freq: Two times a day (BID) | INTRAVENOUS | Status: DC
Start: 2021-10-22 — End: 2021-10-24
  Administered 2021-10-23: 3 mL via INTRAVENOUS

## 2021-10-22 NOTE — Progress Notes (Signed)
VASCULAR LAB    ABIs have been performed.  See CV proc for preliminary results.   Ezequiel Macauley, RVT 10/22/2021, 4:08 PM

## 2021-10-22 NOTE — H&P (Signed)
History and Physical    Patient: Alyssa [name] FAO:[id number] DOB: Mar 14, 1941 DOA: 10/21/2021 DOS: the patient was seen and examined on 10/22/2021 PCP: Pcp, No  Patient coming from: Home ; NOK: Alyssa [name] [place], [phone]   Chief Complaint: L flank pain  HPI: Alyssa [name] is a 81 y.o. female with medical history significant of HTN; HLD; PVD; dementia; and CKD presenting with flank pain, n/v, and decreased UOP.  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  The R groin issue has been present and followed by vascular since 06/2021 and involves anastomosis from prior graft.  She has not followed up as an outpatient.   She reports that she is having flank and back pain.  She is aware of the pseudoaneurysm and has been informed that she is not likely a candidate for surgery.     ER Course:  L flank pain, CT ok.  Urine looks infected.  Has groin pseudoaneurysm in groin from prior bypass - vascular surgery recommends admitting to decide whether or not to operate.  If too risky to fix, needs hospice.  Recommends palliative care consult, vascular will speak with son.   Review of Systems: As mentioned in the history of present illness. All other systems reviewed and are negative. Past Medical History:  Diagnosis Date   Anxiety    Arthritis    Chronic kidney disease    Headache(784.0)    Hyperlipidemia    Hypertension    Peripheral neuropathy    Peripheral vascular disease ([place])    Syncope 09/2020   Past Surgical History:  Procedure Laterality Date   ABDOMINAL ANGIOGRAM N/A 10/29/2011   Procedure: ABDOMINAL ANGIOGRAM;  Surgeon: Elam Dutch, MD;  [facility] CATH LAB;  Service: Cardiovascular;  Laterality: N/A;   BACK SURGERY     COLONOSCOPY     ESOPHAGOGASTRODUODENOSCOPY     FEMORAL-FEMORAL BYPASS GRAFT  08/31/2010   FLEXIBLE SIGMOIDOSCOPY  N/A 06/12/2019   Procedure: FLEXIBLE SIGMOIDOSCOPY;  Surgeon: Ladene Artist, MD;  Location: [facility];  Service: Endoscopy;  Laterality: N/A;  Patient had little bit to eat this morning so this will be unsedated.   JOINT REPLACEMENT Right 10/18/2010   Hip   LOWER EXTREMITY ANGIOGRAPHY  06/14/2017   Procedure: Lower Extremity Angiography;  Surgeon: Adrian Prows, MD;  Location: [place] CV LAB;  Service: Cardiovascular;;  Bilateral limited angio performed   RENAL ANGIOGRAPHY N/A 06/14/2017   Procedure: Renal Angiography;  Surgeon: Adrian Prows, MD;  Location: [place] CV LAB;  Service: Cardiovascular;  Laterality: N/A;   TOTAL HIP ARTHROPLASTY     right   Social History:  reports that she quit smoking about 6 months ago. Her smoking use included cigarettes. She has a 50.00 pack-year smoking history. She has never used smokeless tobacco. She reports that she does not drink alcohol and does not use drugs.  No Known Allergies  Family History  Problem Relation Age of Onset   Heart attack Mother    Heart disease Mother    Hyperlipidemia Mother    Hypertension Mother    Heart attack Father    Heart disease Father        Before age 45   Hyperlipidemia Father    Hypertension Father    Cancer Sister        brain tumor   Aneurysm Brother        brain  Colon cancer Neg Hx    Liver cancer Neg Hx     Prior to Admission medications   Medication Sig Start Date End Date Taking? Authorizing Provider  albuterol (VENTOLIN HFA) 108 (90 Base) MCG/ACT inhaler Inhale 2 puffs into the lungs every 4 (four) hours as needed for wheezing or shortness of breath.   Yes [provider]  amitriptyline (ELAVIL) 10 MG tablet Take 10 mg by mouth daily.   Yes [provider]  amLODipine (NORVASC) 10 MG tablet Take 1 tablet (10 mg total) by mouth daily. 09/18/21  Yes Little Ishikawa, MD  aspirin 81 MG EC tablet Take 1 tablet (81 mg total) by mouth daily. 01/09/19  Yes Gherghe, Vella Redhead,  MD  Budeson-Glycopyrrol-Formoterol (BREZTRI AEROSPHERE) 160-9-4.8 MCG/ACT AERO Inhale 2 puffs into the lungs 2 (two) times daily. 07/23/21  Yes Tanda Rockers, MD  clopidogrel (PLAVIX) 75 MG tablet Take 1 tablet (75 mg total) by mouth daily. 01/09/19  Yes Caren Griffins, MD  diclofenac Sodium (VOLTAREN) 1 % GEL Apply 2 g topically every 6 (six) hours as needed (knee pain). 01/15/20  Yes [provider]  dicyclomine (BENTYL) 20 MG tablet Take 20 mg by mouth 3 (three) times daily. 04/01/20  Yes [provider]  docusate sodium (COLACE) 100 MG capsule Take 1 capsule (100 mg total) by mouth daily. 07/11/21  Yes Antonieta Pert, MD  Ensure Plus (ENSURE PLUS) LIQD Take 237 mLs by mouth daily.   Yes [provider]  Ferrous Sulfate (IRON HIGH-POTENCY) 325 MG TABS Take 325 mg by mouth daily with breakfast. Patient taking differently: Take 325 mg by mouth daily. 06/30/19  Yes Hongalgi, Lenis Dickinson, MD  hydrALAZINE (APRESOLINE) 50 MG tablet Take 1 tablet (50 mg total) by mouth every 8 (eight) hours. 10/09/21  Yes Patrecia Pour, MD  HYDROmorphone (DILAUDID) 2 MG tablet Take 0.5-1 tablets (1-2 mg total) by mouth every 4 (four) hours as needed for severe pain or moderate pain. 10/09/21  Yes Patrecia Pour, MD  hydrOXYzine (ATARAX/VISTARIL) 25 MG tablet Take 25 mg by mouth at bedtime. 05/12/20  Yes [provider]  lidocaine (LIDODERM) 5 % Place 1 patch onto the skin daily. 08/31/21  Yes [provider]  LORazepam (ATIVAN) 0.5 MG tablet Take 0.5 mg by mouth every 4 (four) hours as needed for anxiety or sleep. 10/21/21  Yes [provider]  magnesium oxide (MAG-OX) 400 MG tablet Take 400 mg by mouth 2 (two) times daily.   Yes [provider]  methocarbamol (ROBAXIN) 500 MG tablet Take 500 mg by mouth 3 (three) times daily as needed for muscle spasms.   Yes [provider]  Multiple Vitamins-Minerals (CERTAVITE/ANTIOXIDANTS) TABS Take 1 tablet by mouth at  bedtime. 10/09/21  Yes Patrecia Pour, MD  nitroGLYCERIN (NITROSTAT) 0.4 MG SL tablet Place 1 tablet (0.4 mg total) under the tongue every 5 (five) minutes as needed for chest pain. 01/07/20  Yes Allie Bossier, MD  pregabalin (LYRICA) 75 MG capsule Take 1 capsule (75 mg total) by mouth daily. 07/11/21 11/17/21 Yes Antonieta Pert, MD  rosuvastatin (CRESTOR) 40 MG tablet Take 40 mg by mouth daily. 10/16/21  Yes [provider]  sodium bicarbonate 650 MG tablet Take 2 tablets (1,300 mg total) by mouth 2 (two) times daily. 01/07/20  Yes Allie Bossier, MD  sodium zirconium cyclosilicate (LOKELMA) 10 g PACK packet Take 10 g by mouth 3 (three) times a week. Do not take this within  2 hours of other medications 10/09/21  Yes Patrecia Pour, MD  valproic acid (DEPAKENE) 250 MG capsule Take 250 mg by mouth at bedtime.   Yes [provider]  pantoprazole (PROTONIX) 40 MG tablet Take 1 tablet (40 mg total) by mouth daily. Patient not taking: Reported on 10/22/2021 06/16/19   Modena Jansky, MD    Physical Exam: Vitals:   10/22/21 1050 10/22/21 1226 10/22/21 1331 10/22/21 1659  BP: (!) 176/107 (!) 191/104 (!) 164/101   Pulse: 96 95 90   Resp: 20 20 16    Temp: 98 F (36.7 C) 99.5 F (37.5 C) 98.2 F (36.8 C)   TempSrc: Oral Temporal Oral   SpO2: 98% 99% 99% 99%   General:  Appears calm and comfortable and is in NAD Eyes:  EOMI, normal lids, iris ENT:  grossly normal hearing, lips & tongue, mmm; absent upper dentition Neck:  no LAD, masses or thyromegaly Cardiovascular:  RRR, no m/r/g. No LE edema.  Respiratory:   CTA bilaterally with no wheezes/rales/rhonchi.  Normal respiratory effort. Abdomen:  soft, NT, ND; R groin mass about the 3-4 cm in circumference - it is firm and pulsatile Skin:  no rash or induration seen on limited exam Musculoskeletal:  grossly normal tone BUE/BLE, good ROM, no bony abnormality Psychiatric:  blunted mood and affect, speech fluent and appropriate,  AOx3 Neurologic:  CN 2-12 grossly intact, moves all extremities in coordinated fashion   Radiological Exams on Admission: Independently reviewed - see discussion in A/P where applicable  VAS [place] ABI WITH/WO TBI  Result Date: 10/22/2021  [facility] STUDY Patient Name:  Alyssa [name]  Date of Exam:   10/22/2021 Medical Rec #: [id number]             Accession #:    [id number] Date of Birth: 1941-02-07            Patient Gender: F Patient Age:   83 years Exam Location:  [facility] Procedure:      VAS [place] ABI WITH/WO TBI Referring Phys: Jamelle Haring --------------------------------------------------------------------------------  Indications: Peripheral artery disease. High Risk Factors: Hypertension, hyperlipidemia.  Vascular               Femoral-femoral bypass grafting in 2011, lost to follow Interventions:         up. Comparison Study: Prior ABI done 09/30/11 Performing Technologist: Alyssa [name] RVS  Examination Guidelines: A complete evaluation includes at minimum, Doppler waveform signals and systolic blood pressure reading at the level of bilateral brachial, anterior tibial, and posterior tibial arteries, when vessel segments are accessible. Bilateral testing is considered an integral part of a complete examination. Photoelectric Plethysmograph (PPG) waveforms and toe systolic pressure readings are included as required and additional duplex testing as needed. Limited examinations for reoccurring indications may be performed as noted.  ABI Findings: +---------+------------------+-----+-----------+--------+  Right     Rt Pressure (mmHg) Index Waveform    Comment   +---------+------------------+-----+-----------+--------+  Brachial  198                      multiphasic           +---------+------------------+-----+-----------+--------+  PTA       97                 0.49  monophasic            +---------+------------------+-----+-----------+--------+  DP        95  0.48  monophasic            +---------+------------------+-----+-----------+--------+  Great Toe 79                 0.40                        +---------+------------------+-----+-----------+--------+ +---------+------------------+-----+-----------+-------+  Left      Lt Pressure (mmHg) Index Waveform    Comment  +---------+------------------+-----+-----------+-------+  Brachial  193                      multiphasic          +---------+------------------+-----+-----------+-------+  PTA       85                 0.43  monophasic           +---------+------------------+-----+-----------+-------+  DP        117                0.59  monophasic           +---------+------------------+-----+-----------+-------+  Great Toe 99                 0.50                       +---------+------------------+-----+-----------+-------+ +-------+-----------+-----------+------------+------------+  ABI/TBI Today's ABI Today's TBI Previous ABI Previous TBI  +-------+-----------+-----------+------------+------------+  Right   0.49        0.40        0.94                       +-------+-----------+-----------+------------+------------+  Left    0.59        0.50        0.94                       +-------+-----------+-----------+------------+------------+ Bilateral ABIs appear decreased compared to prior study on 09/30/11.  Summary: Right: Resting right ankle-brachial index indicates severe right lower extremity arterial disease. The right toe-brachial index is abnormal. Left: Resting left ankle-brachial index indicates moderate left lower extremity arterial disease. The left toe-brachial index is abnormal.  *See table(s) above for measurements and observations.  Electronically signed by Jamelle Haring on 10/22/2021 at 4:59:29 PM.    Final    CT Renal Stone Study  Result Date: 10/21/2021 CLINICAL DATA:  Flank pain.  Concern for kidney stone. EXAM: CT ABDOMEN AND PELVIS WITHOUT CONTRAST TECHNIQUE: Multidetector CT imaging of the abdomen and  pelvis was performed following the standard protocol without IV contrast. COMPARISON:  CT of the abdomen pelvis dated 09/15/2021. FINDINGS: Evaluation of this exam is limited in the absence of intravenous contrast. Evaluation is also limited due to streak artifact caused by right hip arthroplasty and spinal hardware as well as respiratory motion. Lower chest: Minimal bibasilar atelectasis. There is coronary vascular calcification. No intra-abdominal free air or free fluid. Hepatobiliary: No focal liver abnormality is seen. No gallstones, gallbladder wall thickening, or biliary dilatation. Pancreas: The pancreas is moderately atrophic. No active inflammatory changes. Spleen: Normal in size without focal abnormality. Adrenals/Urinary Tract: The adrenal glands are grossly unremarkable. Mild right renal parenchyma atrophy. A 6 mm calcific focus over the left renal hilum, likely vascular calcification. No hydronephrosis or nephrolithiasis. Small bilateral renal hypodense lesions are not characterized on this CT. The visualized ureters and urinary bladder appear unremarkable. Stomach/Bowel: There  is severe sigmoid diverticulosis. There is a small hiatal hernia. No bowel obstruction. The appendix is normal. Vascular/Lymphatic: Advanced aortoiliac atherosclerotic disease. The IVC is grossly unremarkable. No portal venous gas. There is no adenopathy. Reproductive: The uterus is poorly visualized. Probable small calcified uterine fibroid. 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 Musculoskeletal: Right hip arthroplasty. Osteopenia with degenerative changes of the spine. Lower lumbar fixation hardware. No acute osseous pathology. IMPRESSION: 1. No acute intra-abdominal or pelvic pathology. No hydronephrosis or nephrolithiasis. 2. Severe sigmoid diverticulosis. No bowel obstruction. Normal appendix. 3. Aortic Atherosclerosis (ICD10-I70.0).  Electronically Signed   By: Anner Crete M.D.   On: 10/21/2021 21:57   VAS [place] LOWER EXTREMITY ARTERIAL DUPLEX  Result Date: 10/22/2021 LOWER EXTREMITY ARTERIAL DUPLEX STUDY Patient Name:  Alyssa [name]  Date of Exam:   10/22/2021 Medical Rec #: [id number]             Accession #:    [id number] Date of Birth: Mar 05, 1941            Patient Gender: F Patient Age:   27 years Exam [facility]:  [facility] Procedure:      VAS [place] LOWER EXTREMITY ARTERIAL DUPLEX Referring Phys: Jamelle Haring --------------------------------------------------------------------------------  Indications: Peripheral artery disease, and Known active pseudoaneurysm. High Risk Factors: Hypertension, hyperlipidemia.  Vascular               Femoral-femoral bypass grafting in 2011, lost to follow Interventions:         up. Current ABI:           R:0.49 L:0.59 Comparison Study: Prior LE bypass graft duplex exam indicating patent graft done                   09/30/11 Performing Technologist: Alyssa [name] RVS  Examination Guidelines: A complete evaluation includes B-mode imaging, spectral Doppler, color Doppler, and power Doppler as needed of all accessible portions of each vessel. Bilateral testing is considered an integral part of a complete examination. Limited examinations for reoccurring indications may be performed as noted.  +----------+--------+-----+--------+----------+--------------------------+  RIGHT      PSV cm/s Ratio Stenosis Waveform   Comments                    +----------+--------+-----+--------+----------+--------------------------+  CFA Prox   96                      monophasic                             +----------+--------+-----+--------+----------+--------------------------+  DFA        26                      monophasic multiple collaterals noted  +----------+--------+-----+--------+----------+--------------------------+  SFA Prox   67                      monophasic                              +----------+--------+-----+--------+----------+--------------------------+  SFA Mid    69                      monophasic                             +----------+--------+-----+--------+----------+--------------------------+  SFA Distal 107                     monophasic                             +----------+--------+-----+--------+----------+--------------------------+  POP Prox   45                      monophasic                             +----------+--------+-----+--------+----------+--------------------------+  POP Mid    49                      monophasic                             +----------+--------+-----+--------+----------+--------------------------+  PTA Prox   46                      monophasic                             +----------+--------+-----+--------+----------+--------------------------+  PTA Mid    20                      monophasic                             +----------+--------+-----+--------+----------+--------------------------+ 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   Summary: Right: Diffuse atherosclerotic plaque noted throughout. 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  See table(s) above for measurements and observations. Electronically signed by Jamelle Haring on 10/22/2021 at 5:00:29 PM.    Final     EKG: Independently reviewed.  NSR with rate 97; nonspecific ST changes with no evidence of acute ischemia   Labs on Admission: I have personally reviewed the available labs and imaging studies at the time of the admission.  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)  Flank pain  Chronic pain syndrome  Cigarette smoker  CKD (chronic kidney disease) stage 4, GFR 15-29 ml/min (HCC)  COPD  GOLD 0/ still smoking   Dementia (HCC)  DNR (do not resuscitate)  Essential hypertension  Hyperkalemia  PVD (peripheral vascular disease) (HCC)     Flank pain -History is not overly concerning for UTI -UA is abnormal but not strongly suggestive of UTI -CT stone study was unremarkable -She was given one dose of Rocephin in the ER -Will order urine culture and await results prior to treating further  R groin pseudoaneurysm -Previously noted and followed by vascular surgery but patient did not f/u outpatient -She is a poor candidate for surgical repair due to inherent surgical risks as well as her underlying medical fragility -Plan for now is monitoring in the hospital with palliative care consultation -She is likely a hospice candidate due to this issue,  since rupture would likely be a terminal event -She is on prn PO Dilaudid  PVD -ABIs were performed and show severe RLE and moderate LLE arterial disease -Continue ASA, Plavix   Stage 4 CKD -Appears to be stable at this time -Will follow -Continue Bicarbonate -Recent hyperkalemia and so started on Lokelma; K+ today is 3.8 so this is working  HTN -Continue Norvasc, hydralazine   COPD -Continue Breztri, Albuterol as needed   Dementia -Continue Aricept, amitriptyline, hydroxyzine, Ativan -delirium precautions   Chronic pain -I have reviewed this patient in the [place] Controlled Substances Reporting System.  He is receiving medications from only one provider and appears to be taking them as prescribed. -I have reviewed this patient in the [place] Controlled Substances Reporting System.  He is receiving medications from two providers but appears to be taking them as prescribed. -He is not at  particularly high risk of opioid misuse, diversion, or overdose.  -Continue Dilaudid, Robaxin, Lyrica  HLD -Continue Crestor   DNR -I have discussed code status with the patient and reviewed her ACP documents; the patient would not desire resuscitation and would prefer to die a natural death should that situation arise.    Advance Care Planning:   Code Status: DNR   Consults: Vascular surgery; Palliative care  Family Communication: Dr. Stanford Breed spoke with her son at the time of admission  Severity of Illness: The appropriate patient status for this patient is OBSERVATION. Observation status is judged to be reasonable and necessary in order to provide the required intensity of service to ensure the patient's safety. The patient's presenting symptoms, physical exam findings, and initial radiographic and laboratory data in the context of their medical condition is felt to place them at decreased risk for further clinical deterioration. Furthermore, it is anticipated that the patient will be medically stable for discharge from the hospital within 2 midnights of admission.   Author: Karmen Bongo, MD 10/22/2021 5:24 PM  For on call review [email or web address].

## 2021-10-22 NOTE — ED Provider Notes (Addendum)
[facility] EMERGENCY DEPARTMENT Provider Note   CSN: [id number] Arrival date & time: 10/21/21  2026     History  No chief complaint on file.   Alyssa [name] is a 81 y.o. female.  Patient is a 81 year old female who presents with left-sided flank pain.  She said has been going on for about a week although on chart review I see that she was complaining of some left upper side abdominal pain during her last couple admissions.  She said it starts in her left mid back and goes around to her left abdomen.  She has no nausea or vomiting.  She said she had a fever last week but has not had any since then.  No difficulty urinating.  No current nausea or vomiting.  She said she had an episode of vomiting last week but none since then.  No cough or cold symptoms.  She also has a swollen area in her right groin.  She says been there for couple months but now its more tender than previously noted.      Home Medications Prior to Admission medications   Medication Sig Start Date End Date Taking? Authorizing Provider  albuterol (VENTOLIN HFA) 108 (90 Base) MCG/ACT inhaler Inhale 2 puffs into the lungs every 4 (four) hours as needed for wheezing or shortness of breath.   Yes [provider]  amitriptyline (ELAVIL) 10 MG tablet Take 10 mg by mouth daily.   Yes [provider]  amLODipine (NORVASC) 10 MG tablet Take 1 tablet (10 mg total) by mouth daily. 09/18/21  Yes Little Ishikawa, MD  aspirin 81 MG EC tablet Take 1 tablet (81 mg total) by mouth daily. 01/09/19  Yes Gherghe, Vella Redhead, MD  Budeson-Glycopyrrol-Formoterol (BREZTRI AEROSPHERE) 160-9-4.8 MCG/ACT AERO Inhale 2 puffs into the lungs 2 (two) times daily. 07/23/21  Yes Tanda Rockers, MD  clopidogrel (PLAVIX) 75 MG tablet Take 1 tablet (75 mg total) by mouth daily. 01/09/19  Yes Caren Griffins, MD  diclofenac Sodium (VOLTAREN) 1 % GEL Apply 2 g topically every 6 (six) hours as needed (knee pain).  01/15/20  Yes [provider]  dicyclomine (BENTYL) 20 MG tablet Take 20 mg by mouth 3 (three) times daily. 04/01/20  Yes [provider]  docusate sodium (COLACE) 100 MG capsule Take 1 capsule (100 mg total) by mouth daily. 07/11/21  Yes Antonieta Pert, MD  Ensure Plus (ENSURE PLUS) LIQD Take 237 mLs by mouth daily.   Yes [provider]  Ferrous Sulfate (IRON HIGH-POTENCY) 325 MG TABS Take 325 mg by mouth daily with breakfast. Patient taking differently: Take 325 mg by mouth daily. 06/30/19  Yes Hongalgi, Lenis Dickinson, MD  hydrALAZINE (APRESOLINE) 50 MG tablet Take 1 tablet (50 mg total) by mouth every 8 (eight) hours. 10/09/21  Yes Patrecia Pour, MD  HYDROmorphone (DILAUDID) 2 MG tablet Take 0.5-1 tablets (1-2 mg total) by mouth every 4 (four) hours as needed for severe pain or moderate pain. 10/09/21  Yes Patrecia Pour, MD  hydrOXYzine (ATARAX/VISTARIL) 25 MG tablet Take 25 mg by mouth at bedtime. 05/12/20  Yes [provider]  lidocaine (LIDODERM) 5 % Place 1 patch onto the skin daily. 08/31/21  Yes [provider]  LORazepam (ATIVAN) 0.5 MG tablet Take 0.5 mg by mouth every 4 (four) hours as needed for anxiety or sleep. 10/21/21  Yes [provider]  magnesium oxide (MAG-OX) 400 MG tablet Take 400 mg by mouth 2 (  two) times daily.   Yes [provider]  methocarbamol (ROBAXIN) 500 MG tablet Take 500 mg by mouth 3 (three) times daily as needed for muscle spasms.   Yes [provider]  Multiple Vitamins-Minerals (CERTAVITE/ANTIOXIDANTS) TABS Take 1 tablet by mouth at bedtime. 10/09/21  Yes Patrecia Pour, MD  nitroGLYCERIN (NITROSTAT) 0.4 MG SL tablet Place 1 tablet (0.4 mg total) under the tongue every 5 (five) minutes as needed for chest pain. 01/07/20  Yes Allie Bossier, MD  pregabalin (LYRICA) 75 MG capsule Take 1 capsule (75 mg total) by mouth daily. 07/11/21 11/17/21 Yes Antonieta Pert, MD  rosuvastatin (CRESTOR) 40 MG tablet Take 40 mg by  mouth daily. 10/16/21  Yes [provider]  sodium bicarbonate 650 MG tablet Take 2 tablets (1,300 mg total) by mouth 2 (two) times daily. 01/07/20  Yes Allie Bossier, MD  sodium zirconium cyclosilicate (LOKELMA) 10 g PACK packet Take 10 g by mouth 3 (three) times a week. Do not take this within 2 hours of other medications 10/09/21  Yes Patrecia Pour, MD  valproic acid (DEPAKENE) 250 MG capsule Take 250 mg by mouth at bedtime.   Yes [provider]  pantoprazole (PROTONIX) 40 MG tablet Take 1 tablet (40 mg total) by mouth daily. Patient not taking: Reported on 10/22/2021 06/16/19   Modena Jansky, MD      Allergies    Patient has no known allergies.    Review of Systems   Review of Systems  Constitutional:  Negative for chills, diaphoresis, fatigue and fever.  HENT:  Negative for congestion, rhinorrhea and sneezing.   Eyes: Negative.   Respiratory:  Negative for cough, chest tightness and shortness of breath.   Cardiovascular:  Negative for chest pain and leg swelling.  Gastrointestinal:  Positive for abdominal pain. Negative for blood in stool, diarrhea, nausea and vomiting.  Genitourinary:  Positive for flank pain. Negative for difficulty urinating, frequency and hematuria.  Musculoskeletal:  Negative for arthralgias and back pain.  Skin:  Negative for rash.  Neurological:  Negative for dizziness, speech difficulty, weakness, numbness and headaches.   Physical Exam Updated Vital Signs BP (!) 164/101 (BP Location: Right Arm)    Pulse 90    Temp 98.2 F (36.8 C) (Oral)    Resp 16    SpO2 99%  Physical Exam Constitutional:      Appearance: She is well-developed.  HENT:     Head: Normocephalic and atraumatic.  Eyes:     Pupils: Pupils are equal, round, and reactive to light.  Cardiovascular:     Rate and Rhythm: Normal rate and regular rhythm.     Heart sounds: Normal heart sounds.  Pulmonary:     Effort: Pulmonary effort is normal. No respiratory distress.      Breath sounds: Normal breath sounds. No wheezing or rales.  Chest:     Chest wall: No tenderness.  Abdominal:     General: Bowel sounds are normal.     Palpations: Abdomen is soft.     Tenderness: There is abdominal tenderness (Mild tenderness in her left mid abdomen, no rebound or guarding, no overlying skin lesions). There is no guarding or rebound.     Comments: She has a golf ball sized swollen area in her right groin.  It is firm to the touch.  Its tender on palpation.  There is some mild erythema.  Her feet appear to be well-perfused.  Musculoskeletal:  General: Normal range of motion.     Cervical back: Normal range of motion and neck supple.     Comments: No pain on palpation of the spine  Lymphadenopathy:     Cervical: No cervical adenopathy.  Skin:    General: Skin is warm and dry.     Findings: No rash.  Neurological:     Mental Status: She is alert and oriented to person, place, and time.    ED Results / Procedures / Treatments   Labs (all labs ordered are listed, but only abnormal results are displayed) Labs Reviewed  CBC WITH DIFFERENTIAL/PLATELET - Abnormal; Notable for the following components:      Result Value   RBC 3.69 (*)    Hemoglobin 10.2 (*)    HCT 31.6 (*)    RDW 18.2 (*)    Platelets 545 (*)    All other components within normal limits  COMPREHENSIVE METABOLIC PANEL - Abnormal; Notable for the following components:   CO2 21 (*)    BUN 27 (*)    Creatinine, Ser 2.49 (*)    GFR, Estimated 19 (*)    All other components within normal limits  URINALYSIS, ROUTINE W REFLEX MICROSCOPIC - Abnormal; Notable for the following components:   APPearance CLOUDY (*)    Ketones, ur 5 (*)    Protein, ur >=300 (*)    Leukocytes,Ua LARGE (*)    WBC, UA >50 (*)    Bacteria, UA FEW (*)    All other components within normal limits  CULTURE, BLOOD (ROUTINE X 2)  CULTURE, BLOOD (ROUTINE X 2)  RESP PANEL BY RT-PCR (FLU A&B, COVID) ARPGX2  URINE CULTURE   LIPASE, BLOOD  LACTIC ACID, PLASMA  LACTIC ACID, PLASMA    EKG None  Radiology CT Renal Stone Study  Result Date: 10/21/2021 CLINICAL DATA:  Flank pain.  Concern for kidney stone. EXAM: CT ABDOMEN AND PELVIS WITHOUT CONTRAST TECHNIQUE: Multidetector CT imaging of the abdomen and pelvis was performed following the standard protocol without IV contrast. COMPARISON:  CT of the abdomen pelvis dated 09/15/2021. FINDINGS: Evaluation of this exam is limited in the absence of intravenous contrast. Evaluation is also limited due to streak artifact caused by right hip arthroplasty and spinal hardware as well as respiratory motion. Lower chest: Minimal bibasilar atelectasis. There is coronary vascular calcification. No intra-abdominal free air or free fluid. Hepatobiliary: No focal liver abnormality is seen. No gallstones, gallbladder wall thickening, or biliary dilatation. Pancreas: The pancreas is moderately atrophic. No active inflammatory changes. Spleen: Normal in size without focal abnormality. Adrenals/Urinary Tract: The adrenal glands are grossly unremarkable. Mild right renal parenchyma atrophy. A 6 mm calcific focus over the left renal hilum, likely vascular calcification. No hydronephrosis or nephrolithiasis. Small bilateral renal hypodense lesions are not characterized on this CT. The visualized ureters and urinary bladder appear unremarkable. Stomach/Bowel: There is severe sigmoid diverticulosis. There is a small hiatal hernia. No bowel obstruction. The appendix is normal. Vascular/Lymphatic: Advanced aortoiliac atherosclerotic disease. The IVC is grossly unremarkable. No portal venous gas. There is no adenopathy. Reproductive: The uterus is poorly visualized. Probable small calcified uterine fibroid. 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 Musculoskeletal: Right hip arthroplasty. Osteopenia with degenerative  changes of the spine. Lower lumbar fixation hardware. No acute osseous pathology. IMPRESSION: 1. No acute intra-abdominal or pelvic pathology. No hydronephrosis or nephrolithiasis. 2. Severe sigmoid diverticulosis. No bowel obstruction. Normal  appendix. 3. Aortic Atherosclerosis (ICD10-I70.0). Electronically Signed   By: Anner Crete M.D.   On: 10/21/2021 21:57    Procedures Procedures    Medications Ordered in ED Medications  cefTRIAXone (ROCEPHIN) 1 g in sodium chloride 0.9 % 100 mL IVPB (0 g Intravenous Stopped 10/22/21 1508)    ED Course/ Medical Decision Making/ A&P                           Medical Decision Making  Patient is a 81 year old female who presents with some left flank pain.  Her CT scan does not show any acute abnormality.  Her labs are nonconcerning.  Her creatinine is elevated but similar to prior values.  These were reviewed by me.  Her chart was reviewed.  It looks like she is had some complaints of left upper abdominal pain on her last admissions.  Her urine does show some signs of infection and she was started on antibiotics for this.  She does have a pulsatile mass consistent with her known pseudoaneurysm in her right groin.  This appears to be unchanged on the CT scan.  I reviewed her records and it has been seen by vascular surgery and does need repair but her prior other medical conditions have prevented this.  It is now more tender than it has been.  I discussed this with vascular surgery who came and evaluated the patient.  They recommend admission and further discussion on operative management of this.  I discussed the patient with Dr. Lorin Mercy who will admit the patient.   Final Clinical Impression(s) / ED Diagnoses Final diagnoses:  Pseudoaneurysm ([place])  Acute cystitis without hematuria    Rx / DC Orders ED Discharge Orders     None         Malvin Johns, MD 10/22/21 1555    Malvin Johns, MD 10/22/21 1556

## 2021-10-22 NOTE — ED Notes (Signed)
Patient decided to leave cant wait no longer

## 2021-10-22 NOTE — Progress Notes (Signed)
VASCULAR LAB    Right lower extremity arterial duplex has been performed.  See CV proc for preliminary results.   Xzander Gilham, RVT 10/22/2021, 4:08 PM

## 2021-10-22 NOTE — Consult Note (Addendum)
[facility] [place]  ASSESSMENT / PLAN: 81 y.o. 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  The patient is chronically ill, and a poor candidate for any kind of vascular surgery.  Repair carries a high risk of mortality.  If the patient survived operative repair, she will almost certainly discharged to a nursing facility, and would likely not return home.  If she has a pseudoaneurysm rupture or thrombosis, emergent revascularization would carry even higher risk.  There are no good options here.    The patient has so far not been able to follow-up with [place] as an outpatient to discuss this difficult situation.  I think she would benefit from admission to the hospital and discussion with palliative care to define her goals of care.  I have discussed the case with the patient's son.  He is very understanding, and does not desire any kind of operative intervention. I will follow the patient along closely.  CHIEF COMPLAINT: Left flank pain  HISTORY OF PRESENT ILLNESS: Alyssa [name] is a 81 y.o. 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  She underwent femoral-femoral bypass grafting in 2011 with Dr. Oneida Alar.  She has had a right groin pseudoaneurysm for some time.  She was lost to follow-up initially in 2015.  I last saw the patient on 06/27/2021.  I recommended elective repair after she recovered from her acute illness.  She returned to the hospital and was seen by my partner, Dr. Trula Slade, on 09/15/2021.  He had scheduled repair, but this was canceled because the patient was too ill.  Today, she presents to the [facility], ER for evaluation of left flank pain.  She is pleasantly confused.  I reviewed with her my concerns for operative repair.  She seemed concerned about these risks as well.  There are some concern that she is demented and does not completely understand what is happening around  her.  She reports that she is living at home with significant assistance from her son and grandson.  She reports she is able to walk, but only with a walker, and only around the house.  She is not able to attend to all of her activities of daily living independently.  Past Medical History:  Diagnosis Date   Acute respiratory failure with hypoxia ([place]) 11/01/2019   Anxiety    Arthritis    Chest pain    Chronic kidney disease    Dyspnea    Elevated troponin 12/13/2018   Headache(784.0)    Hyperlipidemia    Hypertension    Joint pain    Leg pain    Peripheral neuropathy    Peripheral vascular disease ([place])    Syncope 09/2020    Past Surgical History:  Procedure Laterality Date   ABDOMINAL ANGIOGRAM N/A 10/29/2011   Procedure: ABDOMINAL ANGIOGRAM;  Surgeon: Elam Dutch, MD;  Location[facility] C[facility] LAB;  Service: Cardiovascular;  Laterality: N/A;   BACK SURGERY     COLONOSCOPY     ESOPHAGOGASTRODUODENOSCOPY     FEMORAL-FEMORAL BYPASS GRAFT  08/31/2010   FLEXIBLE SIGMOIDOSCOPY N/A 06/12/2019   Procedure: FLEXIBLE SIGMOIDOSCOPY;  Surgeon: Ladene Artist, MD;  Location: [place];  Service: Endoscopy;  Laterality: N/A;  Patient had little bit to eat this morning so this will be unsedated.   JOINT REPLACEMENT Right 10/18/2010   Hip   LOWER EXTREMITY ANGIOGRAPHY  06/14/2017   Procedure: Lower Extremity Angiography;  Surgeon: Adrian Prows, MD;  Location: [place] CV LAB;  Service: Cardiovascular;;  Bilateral limited angio performed   RENAL ANGIOGRAPHY N/A 06/14/2017   Procedure: Renal Angiography;  Surgeon: Adrian Prows, MD;  Location: [place] CV LAB;  Service: Cardiovascular;  Laterality: N/A;   TOTAL HIP ARTHROPLASTY     right    Family History  Problem Relation Age of Onset   Heart attack Mother    Heart disease Mother    Hyperlipidemia Mother    Hypertension Mother    Heart attack Father    Heart disease Father        Before age 61   Hyperlipidemia Father     Hypertension Father    Cancer Sister        brain tumor   Aneurysm Brother        brain   Colon cancer Neg Hx    Liver cancer Neg Hx     Social History   Socioeconomic History   Marital status: Single    Spouse name: Not on file   Number of children: 2   Years of education: 11th   Highest education level: Not on file  Occupational History   Occupation: Retired  Tobacco Use   Smoking status: Former    Packs/day: 1.00    Years: 50.00    Pack years: 50.00    Types: Cigarettes    Quit date: 04/22/2021    Years since quitting: 0.5   Smokeless tobacco: Never   Tobacco comments:    Quit 3 months ago 07/23/21 [facility]  Vaping Use   Vaping Use: Never used  Substance and Sexual Activity   Alcohol use: No   Drug use: No   Sexual activity: Not Currently  Other Topics Concern   Not on file  Social History Narrative    Lives at home with her brother.   Right-handed.   Occasional caffeine use.   Social Determinants of Health   Financial Resource Strain: Not on file  Food Insecurity: Not on file  Transportation Needs: Not on file  Physical Activity: Not on file  Stress: Not on file  Social Connections: Not on file  Intimate Partner Violence: Not on file    No Known Allergies  No current facility-administered medications for this encounter.   Current Outpatient Medications  Medication Sig Dispense Refill   albuterol (VENTOLIN HFA) 108 (90 Base) MCG/ACT inhaler Inhale 2 puffs into the lungs every 4 (four) hours as needed for wheezing or shortness of breath.     amitriptyline (ELAVIL) 10 MG tablet Take 10 mg by mouth daily.     amLODipine (NORVASC) 10 MG tablet Take 1 tablet (10 mg total) by mouth daily. 30 tablet 0   aspirin 81 MG EC tablet Take 1 tablet (81 mg total) by mouth daily. 30 tablet 1   Budeson-Glycopyrrol-Formoterol (BREZTRI AEROSPHERE) 160-9-4.8 MCG/ACT AERO Inhale 2 puffs into the lungs 2 (two) times daily. 10.7 g 11   clopidogrel (PLAVIX) 75 MG tablet Take 1  tablet (75 mg total) by mouth daily. 30 tablet 1   diclofenac Sodium (VOLTAREN) 1 % GEL Apply 2 g topically every 6 (six) hours as needed (knee pain).     dicyclomine (BENTYL) 20 MG tablet Take 20 mg by mouth 3 (three) times daily.     docusate sodium (COLACE) 100 MG capsule Take 1 capsule (100 mg total) by mouth daily. 10 capsule 0   Ensure Plus (ENSURE PLUS) LIQD Take 237 mLs by mouth daily.     Ferrous  Sulfate (IRON HIGH-POTENCY) 325 MG TABS Take 325 mg by mouth daily with breakfast. (Patient taking differently: Take 325 mg by mouth daily.)     hydrALAZINE (APRESOLINE) 50 MG tablet Take 1 tablet (50 mg total) by mouth every 8 (eight) hours. 90 tablet 0   HYDROmorphone (DILAUDID) 2 MG tablet Take 0.5-1 tablets (1-2 mg total) by mouth every 4 (four) hours as needed for severe pain or moderate pain. 15 tablet 0   hydrOXYzine (ATARAX/VISTARIL) 25 MG tablet Take 25 mg by mouth at bedtime.     lidocaine (LIDODERM) 5 % Place 1 patch onto the skin daily.     LORazepam (ATIVAN) 0.5 MG tablet Take 0.5 mg by mouth every 4 (four) hours as needed for anxiety or sleep.     magnesium oxide (MAG-OX) 400 MG tablet Take 400 mg by mouth 2 (two) times daily.     methocarbamol (ROBAXIN) 500 MG tablet Take 500 mg by mouth 3 (three) times daily as needed for muscle spasms.     Multiple Vitamins-Minerals (CERTAVITE/ANTIOXIDANTS) TABS Take 1 tablet by mouth at bedtime. 30 tablet 0   nitroGLYCERIN (NITROSTAT) 0.4 MG SL tablet Place 1 tablet (0.4 mg total) under the tongue every 5 (five) minutes as needed for chest pain. 30 tablet 0   pregabalin (LYRICA) 75 MG capsule Take 1 capsule (75 mg total) by mouth daily. 30 capsule 0   rosuvastatin (CRESTOR) 40 MG tablet Take 40 mg by mouth daily.     sodium bicarbonate 650 MG tablet Take 2 tablets (1,300 mg total) by mouth 2 (two) times daily. 60 tablet 0   sodium zirconium cyclosilicate (LOKELMA) 10 g PACK packet Take 10 g by mouth 3 (three) times a week. Do not take this within  2 hours of other medications 15 packet 0   valproic acid (DEPAKENE) 250 MG capsule Take 250 mg by mouth at bedtime.     pantoprazole (PROTONIX) 40 MG tablet Take 1 tablet (40 mg total) by mouth daily. (Patient not taking: Reported on 10/22/2021) 30 tablet 0    REVIEW OF SYSTEMS:  [X]  denotes positive finding, [ ]  denotes negative finding Cardiac  Comments:  Chest pain or chest pressure:    Shortness of breath upon exertion:    Short of breath when lying flat:    Irregular heart rhythm:        Vascular    Pain in calf, thigh, or hip brought on by ambulation:    Pain in feet at night that wakes you up from your sleep:     Blood clot in your veins:    Leg swelling:         Pulmonary    Oxygen at home:    Productive cough:     Wheezing:         Neurologic    Sudden weakness in arms or legs:     Sudden numbness in arms or legs:     Sudden onset of difficulty speaking or slurred speech:    Temporary loss of vision in one eye:     Problems with dizziness:         Gastrointestinal    Blood in stool:     Vomited blood:         Genitourinary    Burning when urinating:     Blood in urine:        Psychiatric    Major depression:         Hematologic    Bleeding  problems:    Problems with blood clotting too easily:        Skin    Rashes or ulcers:        Constitutional    Fever or chills:      PHYSICAL EXAM Vitals:   10/22/21 0904 10/22/21 1050 10/22/21 1226 10/22/21 1331  BP: (!) 176/90 (!) 176/107 (!) 191/104 (!) 164/101  Pulse: 97 96 95 90  Resp: 16 20 20 16   Temp: 98.3 F (36.8 C) 98 F (36.7 C) 99.5 F (37.5 C) 98.2 F (36.8 C)  TempSrc: Oral Oral Temporal Oral  SpO2: 100% 98% 99% 99%    Constitutional: Elderly. Chronically ill. No distress. Frail. Malnourished. Neurologic: CN intact. no focal findings. no sensory loss. Psychiatric:  Mood and affect symmetric and appropriate. Eyes:  No icterus. No conjunctival pallor. Ears, nose, throat:  mucous membranes  moist. Midline trachea.  Cardiac: regular rate and rhythm.  Respiratory:  unlabored. Abdominal:  soft, non-tender, non-distended.  Peripheral vascular: large pulsatile pseudoaneurysm in right groin appears unchanged from my previous exam. Extremity: no edema. no cyanosis. no pallor.  Skin: no gangrene. no ulceration. No evidence of ulceration or infection in the right groin. Lymphatic: no Stemmer's sign. no palpable lymphadenopathy.  PERTINENT LABORATORY AND RADIOLOGIC DATA  Most recent CBC CBC Latest Ref Rng & Units 10/21/2021 10/08/2021 10/07/2021  WBC 4.0 - 10.5 K/uL 8.5 - 6.3  Hemoglobin 12.0 - 15.0 g/dL 10.2(L) 8.4(L) 8.2(L)  Hematocrit 36.0 - 46.0 % 31.6(L) 25.2(L) 25.3(L)  Platelets 150 - 400 K/uL 545(H) - 380     Most recent CMP CMP Latest Ref Rng & Units 10/21/2021 10/09/2021 10/08/2021  Glucose 70 - 99 mg/dL 97 105(H) 105(H)  BUN 8 - 23 mg/dL 27(H) 31(H) 31(H)  Creatinine 0.44 - 1.00 mg/dL 2.49(H) 2.91(H) 2.78(H)  Sodium 135 - 145 mmol/L 138 134(L) 132(L)  Potassium 3.5 - 5.1 mmol/L 3.8 5.4(H) 5.2(H)  Chloride 98 - 111 mmol/L 102 105 105  CO2 22 - 32 mmol/L 21(L) 22 19(L)  Calcium 8.9 - 10.3 mg/dL 9.9 9.6 9.2  Total Protein 6.5 - 8.1 g/dL 7.4 - -  Total Bilirubin 0.3 - 1.2 mg/dL 0.6 - -  Alkaline Phos 38 - 126 U/L 95 - -  AST 15 - 41 U/L 33 - -  ALT 0 - 44 U/L 22 - -    Renal function Estimated Creatinine Clearance: 11.8 mL/min (A) (by C-G formula based on SCr of 2.49 mg/dL (H)).  Hgb A1c MFr Bld (%)  Date Value  06/21/2021 6.7 (H)    Alyssa [name]. Stanford Breed, MD [facility] Phone Number: [phone] 10/22/2021 2:56 PM  Total time spent on preparing this encounter including chart review, data review, collecting history, examining the patient, coordinating care for this established patient, 40 minutes.  Portions of this report may have been transcribed using voice recognition software.  Every effort has been made to ensure  accuracy; however, inadvertent computerized transcription errors may still be present.

## 2021-10-22 NOTE — ED Notes (Signed)
Pt moved into hospital bed, new linens and blankets given. Pt resting comfortably

## 2021-10-23 ENCOUNTER — Encounter (HOSPITAL_COMMUNITY): Payer: Self-pay | Admitting: Internal Medicine

## 2021-10-23 ENCOUNTER — Ambulatory Visit: Payer: Medicare HMO | Admitting: Internal Medicine

## 2021-10-23 DIAGNOSIS — G894 Chronic pain syndrome: Secondary | ICD-10-CM

## 2021-10-23 DIAGNOSIS — R109 Unspecified abdominal pain: Secondary | ICD-10-CM

## 2021-10-23 DIAGNOSIS — I729 Aneurysm of unspecified site: Secondary | ICD-10-CM

## 2021-10-23 DIAGNOSIS — J449 Chronic obstructive pulmonary disease, unspecified: Secondary | ICD-10-CM

## 2021-10-23 DIAGNOSIS — Z515 Encounter for palliative care: Secondary | ICD-10-CM

## 2021-10-23 DIAGNOSIS — Z7189 Other specified counseling: Secondary | ICD-10-CM

## 2021-10-23 DIAGNOSIS — N184 Chronic kidney disease, stage 4 (severe): Secondary | ICD-10-CM | POA: Diagnosis not present

## 2021-10-23 DIAGNOSIS — I724 Aneurysm of artery of lower extremity: Secondary | ICD-10-CM | POA: Diagnosis not present

## 2021-10-23 DIAGNOSIS — I739 Peripheral vascular disease, unspecified: Secondary | ICD-10-CM

## 2021-10-23 DIAGNOSIS — Z66 Do not resuscitate: Secondary | ICD-10-CM

## 2021-10-23 LAB — CBC
HCT: 26.5 % — ABNORMAL LOW (ref 36.0–46.0)
Hemoglobin: 9 g/dL — ABNORMAL LOW (ref 12.0–15.0)
MCH: 28.1 pg (ref 26.0–34.0)
MCHC: 34 g/dL (ref 30.0–36.0)
MCV: 82.8 fL (ref 80.0–100.0)
Platelets: 497 10*3/uL — ABNORMAL HIGH (ref 150–400)
RBC: 3.2 MIL/uL — ABNORMAL LOW (ref 3.87–5.11)
RDW: 17.9 % — ABNORMAL HIGH (ref 11.5–15.5)
WBC: 7.1 10*3/uL (ref 4.0–10.5)
nRBC: 0 % (ref 0.0–0.2)

## 2021-10-23 LAB — BASIC METABOLIC PANEL
Anion gap: 11 (ref 5–15)
BUN: 29 mg/dL — ABNORMAL HIGH (ref 8–23)
CO2: 23 mmol/L (ref 22–32)
Calcium: 8.9 mg/dL (ref 8.9–10.3)
Chloride: 102 mmol/L (ref 98–111)
Creatinine, Ser: 2.36 mg/dL — ABNORMAL HIGH (ref 0.44–1.00)
GFR, Estimated: 21 mL/min — ABNORMAL LOW (ref >60)
Glucose, Bld: 88 mg/dL (ref 70–99)
Potassium: 3.6 mmol/L (ref 3.5–5.1)
Sodium: 136 mmol/L (ref 135–145)

## 2021-10-23 LAB — URINE CULTURE

## 2021-10-23 MED ORDER — HYDROMORPHONE HCL 1 MG/ML IJ SOLN
0.5000 mg | INTRAMUSCULAR | Status: DC | PRN
Start: 2021-10-23 — End: 2021-10-24
  Administered 2021-10-24: 0.5 mg via INTRAVENOUS
  Filled 2021-10-23: qty 1

## 2021-10-23 MED ORDER — SODIUM CHLORIDE 0.9 % IV SOLN: 250.0000 mL | INTRAVENOUS | Status: DC | PRN

## 2021-10-23 MED ORDER — SODIUM CHLORIDE 0.9% FLUSH: 3.0000 mL | INTRAVENOUS | Status: DC | PRN

## 2021-10-23 MED ORDER — SODIUM CHLORIDE 0.9 % IV SOLN
1.0000 g | INTRAVENOUS | Status: DC
  Administered 2021-10-23: 1 g via INTRAVENOUS
  Filled 2021-10-23: qty 10

## 2021-10-23 MED ORDER — SODIUM CHLORIDE 0.9% FLUSH
3.0000 mL | Freq: Two times a day (BID) | INTRAVENOUS | Status: DC
  Administered 2021-10-23 – 2021-10-24 (×2): 3 mL via INTRAVENOUS

## 2021-10-23 NOTE — Consult Note (Addendum)
Consultation Note Date: 10/23/2021   [name]  DOB: [birth year]  MRN: [id number]  Age / Sex: 81 y.o., female  PCP: Pcp, No Referring Physician: Jonetta Osgood, MD  Reason for Consultation: Establishing goals of care  HPI/Patient Profile: 81 y.o. female  with past medical history of CKD, dementia, PVD, HTN, HLD who presented to the emergency department on 10/21/2021 with flank pain, nausea/vomiting, and decreased urine output. She was previously admitted from 12/10 - 12/23 with hyperkalemia (discharged with [facility]), bradycardia (resolved), stage IV CKD (not recommended for HD), and pseudoaneurysm. The right groin pseudoaneurysm is from anastomosis from prior graft and has been present since September 2022. She has not followed up as an outpatient. In the ED, imaging was negative for acute intra-abdominal or pelvic pathology. UA suspicious for UTI. Admitted to [facility].     Clinical Assessment and Goals of Care: I have reviewed medical records including EPIC notes, labs and imaging. Patient was seen by vascular surgery yesterday 1/5, and determined to be a poor candidate for any type of vascular surgery.   I went to see patient at bedside in the ED to discuss diagnosis, prognosis, GOC, EOL wishes, disposition, and options. Patient is alert and able to participate in [place] discussion. She has no acute complaints at this time. She does report chronic lower back. She has been prescribed dilaudid which she reports taking about every 4 hours at home. She reports her pain is well controlled with this regimen.   [name] is well known to our service, having been seen multiple times by PMT since 2019-01-11. I re-introduced Palliative Medicine as specialized medical care for people living with serious illness. It focuses on providing relief from the symptoms and stress of a serious illness.   We discussed a  brief life review of the patient. She is originally from the [place] area. 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 She has never been married. She had 2 sons. Sadly, her son Alyssa [name] died here at [facility] in 2021/01/10.   [name] lives alone in her home. Patient has been evaluated by OT while in the ED and determined to be at her baseline functional status. Patient is moderately independent with ambulation with a rollator and moderately independent with ADLs.   We discussed her current illness and what it means in the larger context of her ongoing co-morbidities.  Natural disease trajectory of chronic illness was discussed. [place] verbalizes understanding that she has advanced kidney disease and that she is not a good candidate for dialysis. We also discussed the right groin pseudoaneurysm. She verbalizes understanding that she is not a good candidate for vascular surgery.   I attempted to elicit values and goals of care important to the patient. She wants to return home. She wants to be comfortable and have quality of life for as long as possible. The difference between aggressive medical intervention and comfort care was considered in light of the patient's goals of care.   Provided education and counseling on  the philosophy and benefits of hospice care. Discussed that it offers a holistic approach to care in the setting of end-stage illness/disease, and is about supporting the patient where they are while allowing the natural course to occur. Hospice can provide personal care, support for the family, and help keep patient out of the hospital. Discussed that the goal is comfort and dignity rather than cure/prolonging life.   Alyssa [name] is receptive to the concept of hospice and thinks she has recently been referred to hospice by her PCP. I spoke with her son/[name] by phone who verbalizes understanding of patient's declining health and agrees that hospice is appropriate but does not recall which  agency she had been referred to.   With several additional phone calls, I confirmed that patient was enrolled with [facility] on 1/4. I have spoke with their liaison Tim, and notified them that patient is in the hospital. Tim plans to reach out to [place] over the weekend.    Primary decision maker: Patient. Son Alyssa [name] is next of kin. There is no documented HCPOA.     SUMMARY OF RECOMMENDATIONS   Continue current care Patient understands she is not a candidate for vascular surgery Patient was enrolled with [facility] hosp[facility] 1/4 Plan to discharge home with hospice when medically stable Patient will need short-term dilaudid Rx at discharge  Code Status/Advance Care Planning: DNR  Symptom Management:  Continue home dilaudid 1-2 mg every 4 hours as needed for pain Continue home ativan 0.5 mg every 4 hours as needed for sleep or anxiety  Additional Recommendations (Limitations, Scope, Preferences): No Hemodialysis and No Surgical Procedures  Prognosis:  < 6 months  Discharge Planning: Home with Hospice      Primary Diagnoses: Present on Admission:  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 The following aspects are pertinent.  Past Medical History:  Diagnosis Date   Anxiety    Arthritis    Chronic kidney disease    Headache(784.0)    Hyperlipidemia    Hypertension    Peripheral neuropathy    Peripheral vascular disease ([place])    Syncope 09/2020    Scheduled Meds:  amitriptyline  10 mg Oral Daily   amLODipine  10 mg Oral Daily   aspirin  81 mg Oral Daily   clopidogrel  75 mg Oral  Daily   dicyclomine  20 mg Oral TID   docusate sodium  100 mg Oral Daily   enoxaparin (LOVENOX) injection  30 mg Subcutaneous Q24H   fluticasone furoate-vilanterol  1 puff Inhalation Daily   hydrALAZINE  50 mg Oral Q8H   hydrOXYzine  25 mg Oral QHS   magnesium oxide  400 mg Oral BID   pregabalin  75 mg Oral Daily   rosuvastatin  40 mg Oral Daily   sodium bicarbonate  1,300 mg Oral BID   sodium chloride flush  3 mL Intravenous Q12H   sodium chloride flush  3 mL Intravenous Q12H   sodium zirconium cyclosilicate  10 g Oral Once per day on Mon Wed Fri   umeclidinium bromide  1 puff Inhalation Daily   valproic acid  250  mg Oral QHS   Continuous Infusions:  sodium chloride     cefTRIAXone (ROCEPHIN)  IV 1 g (10/23/21 1410)   PRN Meds:.sodium chloride, acetaminophen **OR** acetaminophen, albuterol, bisacodyl, hydrALAZINE, HYDROmorphone, LORazepam, methocarbamol, nicotine, ondansetron **OR** ondansetron (ZOFRAN) IV, polyethylene glycol, sodium chloride flush   No Known Allergies Review of Systems  Genitourinary:  Positive for flank pain.  Musculoskeletal:  Positive for back pain.   Physical Exam Vitals reviewed.  Constitutional:      General: She is not in acute distress.    Comments: Frail and chronically ill-appearing  Pulmonary:     Effort: Pulmonary effort is normal.  Neurological:     Mental Status: She is alert and oriented to person, place, and time.    Vital Signs: BP 137/75    Pulse 93    Temp 99.4 F (37.4 C) (Oral)    Resp 16    SpO2 99%  Pain Scale: 0-10   Pain Score: 8    SpO2: SpO2: 99 % O2 Device:SpO2: 99 % O2 Flow Rate: .O2 Flow Rate (L/min): 0 L/min   Palliative Assessment/Data: PPS 50%    MDM - High due to: 1 or more chronic illnesses with severe exacerbation, progression, or side effects of treatment OR acute or chronic illness or injury that poses a threat to life or bodily function Review of prior external notes, review of test results, assessment  requiring an independent historian Discussion not to resuscitate or to de-escalate care because poor prognosis    Signed by: Lavena Bullion, NP   Please contact Palliative Medicine Team phone at [phone] for questions and concerns.  For individual provider: See Shea Evans

## 2021-10-23 NOTE — Care Management Obs Status (Signed)
[place]   Patient Details  Name: Alyssa [name] MRN: [id number] Date of Birth: 1940-11-29   Medicare Observation Status Notification Given:  Yes    Fuller Mandril, RN 10/23/2021, 12:52 PM

## 2021-10-23 NOTE — Evaluation (Signed)
Occupational Therapy Evaluation Patient Details Name: Alyssa [name] MRN: [id number] DOB: [birth year] Today's Date: 10/23/2021   History of Present Illness Alyssa [name] is a 81 y.o. female admitted under observation 10/21/21 with flank and back pain, n/v, and decreased UOP. She was previously admitted from 12/10-23 with hyperkalemia (discharged with [facility]), bradycardia (resolved), stage IV CKD (not recommended for HD), and pseudoaneurysm.  The R groin issue has been present and followed by vascular since 06/2021 and involves anastomosis from prior graft.  She has not followed up as an outpatient. PMH includes HTN; HLD; PVD; dementia; and CKD.   Clinical Impression   Pt is typically mod I for mobility with Rollator and mod I for ADL, typically sponge bathes at baseline sitting on commode for energy conservation. Pt was premedicated prior to evaluation and was having very little pain during eval. Today Pt is min A for transfers (no RW due to being in ED) but performed really well and anticipate  she would be supervision/min guard with a RW. Pt was able to don/doff socks sitting EOB and perform seated grooming tasks. Pt stated that she was supposed to be starting PT/OT at the [facility] level prior to coming to the hospital. She also shared that she might be going home with hospice. Pt is at a safe level to dc home and resume HH therapies or whatever plan hospice implements (if that is accurate) OT will sign off acutely at this time.       Recommendations for follow up therapy are one component of a multi-disciplinary discharge planning process, led by the attending physician.  Recommendations may be updated based on patient status, additional functional criteria and insurance authorization.   Follow Up Recommendations  Home health OT    Assistance Recommended at Discharge Intermittent Supervision/Assistance  Patient can return home with the following A little help with walking and/or transfers;A  little help with bathing/dressing/bathroom;Assistance with cooking/housework;Direct supervision/assist for medications management;Direct supervision/assist for financial management;Assist for transportation;Help with stairs or ramp for entrance    Functional Status Assessment  Patient has had a recent decline in their functional status and demonstrates the ability to make significant improvements in function in a reasonable and predictable amount of time.  Equipment Recommendations  None recommended by OT (Pt has appropriate DME)    Recommendations for Other Services       Precautions / Restrictions Precautions Precautions: Fall;Other (comment) (watch HR) Precaution Comments: Watch HR; urinary incontinence Restrictions Weight Bearing Restrictions: No      Mobility Bed Mobility Overal bed mobility: Modified Independent             General bed mobility comments: increased time but no assist needed    Transfers Overall transfer level: Needs assistance Equipment used: 1 person hand held assist Transfers: Sit to/from Stand Sit to Stand: Min assist;From elevated surface (ED strretchert)           General transfer comment: good power up, suspect assist only bc of no DME      Balance Overall balance assessment: Needs assistance Sitting-balance support: Single extremity supported;Feet unsupported Sitting balance-Leahy Scale: Fair Sitting balance - Comments: pt could maintain static sitting and reach to BLE   Standing balance support: Single extremity supported Standing balance-Leahy Scale: Poor Standing balance comment: dependent on at least one UE support                           ADL either performed or assessed  with clinical judgement   ADL Overall ADL's : Needs assistance/impaired     Grooming: Oral care;Wash/dry hands;Set up;Sitting   Upper Body Bathing: Set up;Sitting Upper Body Bathing Details (indicate cue type and reason): sponge bathes at  baseline Lower Body Bathing: Min guard;Sitting/lateral leans   Upper Body Dressing : Set up;Sitting Upper Body Dressing Details (indicate cue type and reason): to don gown around back Lower Body Dressing: Min guard;Sit to/from stand Lower Body Dressing Details (indicate cue type and reason): able to doff/don socks sitting EOB, primarily bending to feet. did fatigue with task but improved with rest break Toilet Transfer: Minimal assistance;Ambulation (no RW due to being in ED)   Toileting- Water quality scientist and Hygiene: Min guard;Sit to/from stand       Functional mobility during ADLs: Minimal assistance (no RW due to ED) General ADL Comments: HR up to 130 with functional activity and short in room mobility HHA     Vision Baseline Vision/History: 1 Wears glasses Patient Visual Report: No change from baseline Vision Assessment?: No apparent visual deficits     Perception     Praxis      Pertinent Vitals/Pain Pain Assessment: Faces Faces Pain Scale: Hurts a little bit Pain Location: L hip, back Pain Descriptors / Indicators: Dull Pain Intervention(s): Monitored during session;Limited activity within patient's tolerance;Repositioned;Premedicated before session     Hand Dominance Right   Extremity/Trunk Assessment Upper Extremity Assessment Upper Extremity Assessment: Generalized weakness   Lower Extremity Assessment Lower Extremity Assessment: Generalized weakness RLE Deficits / Details: 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 unable to demo good functional DF   Cervical / Trunk Assessment Cervical / Trunk Assessment: Kyphotic Cervical / Trunk Exceptions: h/o R THA and LLD   Communication Communication Communication: No difficulties   Cognition Arousal/Alertness: Awake/alert Behavior During Therapy: WFL for tasks assessed/performed Overall Cognitive Status: No family/caregiver present to determine baseline cognitive functioning                                  General Comments: Per chart, h/o dementia. Pt pleasant and agreeable, following directions and answering questions appropriately     General Comments  HR up to 130's with in room mobilty    Exercises     Shoulder Instructions      Home Living Family/patient expects to be discharged to:: Private residence Living Arrangements: Children Available Help at Discharge: Family;Friend(s);Personal care attendant Type of Home: House Home Access: Stairs to enter [employer] of Steps: 4 Entrance Stairs-Rails: Can reach both;Left;Right Home Layout: One level     Bathroom Shower/Tub: Tub/shower unit;Walk-in shower   Bathroom Toilet: Handicapped height Bathroom Accessibility: Yes How Accessible: Accessible via walker Home Equipment: [place] (2 wheels);Rollator (4 wheels);Tub bench;BSC/3in1   Additional Comments: 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      Prior Functioning/Environment Prior Level of Function : Needs assist       Physical Assist : Mobility (physical);ADLs (physical)     Mobility Comments: Generally walks in the home with Mod I and 4WRW ADLs Comments: Able to complete her own self care, son cooks and drives, sister performs home management and helps with medications. she also has an aide that comes 2 hours a day        OT Problem List: Decreased strength;Decreased activity tolerance;Pain      OT Treatment/Interventions:  OT Goals(Current goals can be found in the care plan section) Acute Rehab OT Goals Patient Stated Goal: go home OT Goal Formulation: With patient Time For Goal Achievement: 11/06/21 Potential to Achieve Goals: Good  OT Frequency:      Co-evaluation              AM-PAC OT "6 Clicks" Daily Activity     Outcome Measure Help from another person eating meals?: None Help from another person taking care of personal grooming?: A Little Help from another  person toileting, which includes using toliet, bedpan, or urinal?: A Little Help from another person bathing (including washing, rinsing, drying)?: A Little Help from another person to put on and taking off regular upper body clothing?: A Little Help from another person to put on and taking off regular lower body clothing?: A Little 6 Click Score: 19   End of Session Equipment Utilized During Treatment: Gait belt Nurse Communication: Mobility status;Other (comment) (HR)  Activity Tolerance: Patient tolerated treatment well Patient left: in bed;with bed alarm set;with call bell/phone within reach  OT Visit Diagnosis: Unsteadiness on feet (R26.81);Muscle weakness (generalized) (M62.81);Pain Pain - Right/Left: Left Pain - part of body: Hip (flank and back)                Time: 7680-8811 OT Time Calculation (min): 25 min Charges:  OT General Charges $OT Visit: 1 Visit OT Evaluation $OT Eval Moderate Complexity: [place] OTR/L Acute Rehabilitation Services Pager: [phone] Office: [place] 10/23/2021, 10:32 AM

## 2021-10-23 NOTE — Progress Notes (Deleted)
Alyssa [name], female    DOB: December 09, 1940     MRN: [id number]   Brief patient profile:  33 yobf former smoker (quit 04/2021)   With onset doe in 23X  With diastolic dysfunction and CRI assoc with chronic met acidosis and no spirometry on record then admit:     Admit date: 07/06/2020 Discharge date: 07/10/2020   Admitted From: Home Discharge disposition: Home   Code Status: Full Code  Diet Recommendation: Cardiac diet   Discharge Diagnosis:    Principal Problem:   Metabolic acidosis Active Problems:   Shortness of breath   PVD (peripheral vascular disease) (HCC)   CKD (chronic kidney disease) stage 4, GFR 15-29 ml/min (HCC)   Anemia   Protein-calorie malnutrition, severe (HCC)   Essential hypertension   Abdominal pain   Elevated INR     History of Present Illness / Brief narrative:  Patient is a 81 year old female history of CKD stage IV, hypertension, PAD who was admitted with COVID-19 and acute kidney injury last month. Since discharge patient has been experiencing progressively worsening shortness of breath.  9/19, she had shortness of breath, fever chills and rigors EMS was called.    EMS noted a temperature of 100.8.   In the ED, patient had a temperature of 99, tachycardic to 120, respiratory 25, blood pressure 160/102 Labs with lactate level of 3.2, serum bicarbonate level 9, anion gap 15 Chest x-ray did not show any acute infiltrates. Blood gas showed normal pH of 7.4, PCO2 low at 19, serum bicarb low at 12 Urinalysis showed large leukocyte esterase, negative nitrates, 6-10 WBCs, negative for blood.   Patient was admitted to hospitalist service for further evaluation management.   Subjective:  Seen and examined this morning. Pleasant elderly [name].  Lying down in bed.  Not in distress. No fever last 48 hours.  Blood pressure in 160s.  Breathing comfortably on room air.  Feels ready to go home today.   Hospital Course:  Acute on chronic  metabolic acidosis with respiratory compensation -Patient has chronic metabolic acidosis and was supposed to be on sodium bicarbonate tablet which apparently she was not compliant to. -She presented with a low bicarbonate level of 9.  Blood gas showed PCO2 level at 19. -Patient probably was hyperventilating to generate respiratory compensation for acute worsening of metabolic acidosis. -Pulmonary embolism was ruled out by a negative VQ scan. -Nephrology consult appreciated.  She was started on bicarbonate drip and subsequently switched to resume sodium bicarbonate tablets 1300 mg twice daily. -Continue to follow-up with nephrology as an outpatient   Sepsis ruled out -Initially suspected of sepsis because of respiratory distress. -Chest x-ray and CT imaging did not show any new infiltrates in lungs. -Sepsis ruled out now.  Antibiotics stopped.   Falls/tremors -Patient reports tremors and intermittent falls at home. -9/22, MRI brain was obtained which did not show any evidence of acute intracranial abnormality.  Chronic microvascular ischemic changes were noted. -PT evaluation was obtained.  Patient is functioning as ambulating independently. -Previous hospitalist switched her from Coreg to propanolol which we will continue at discharge.   Hypertension -Currently on clonidine, amlodipine and propanolol.  Blood pressure remains elevated to 160s today.  I would increase amlodipine from 2.5 mg to 5 mg daily at discharge.   CKD stage IV -Creatinine fluctuating but remains at baseline. -Follow-up with nephrology as an outpatient. 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 -Continue iron supplement at home. Recent Labs (within last 365 days)              Recent Labs    06/03/20 0413 06/03/20 1421 06/04/20 0251 06/05/20 0337 06/06/20 0628 06/07/20 0651 06/09/20 1604 07/06/20 1538 07/06/20 2147 07/10/20 0137  HGB 8.6* 8.2* 7.6* 7.1* 6.7* 9.2* 9.6* 9.1* 7.8* 8.5*      COVID-19 pneumonia 06/07/20 transiently needed 02 inpt only/ had been vaccinated prior -No residual symptoms.      History of Present Illness  09/25/2020  Pulmonary/ 1st office eval/Alyssa [name]  Chief Complaint  Patient presents with   Consult    Shortness of breath with activity and sometimes when resting  Dyspnea:  25 ft with rollator for a year  Cough: none  Sleep: props up one one pillow/ bed is flat SABA use: couple times a day seems to help rec Stop propranolol and start bisoprolol 10 mg daily  Plan A = Automatic = Always=    Breztri Take 2 puffs first thing in am and then another 2 puffs about 12 hours later.  Work on inhaler technique: Plan B = Backup (to supplement plan A, not to replace it) Only use your albuterol inhaler as a rescue medication The key is to stop smoking completely before smoking completely stops you! Please schedule a follow up office visit in 4 weeks, sooner if needed with pfts     11/07/2020  f/u ov/Alyssa [name] re: copd clinical dx/ vaccinated and boosted Chief Complaint  Patient presents with   Follow-up    Breathing has improved slightly since the last visit. She is using her albuterol inhaler 3 x per wk on average.    COPD  GOLD ? still smoking  Active smoker -  09/25/2020  After extensive coaching inhaler device,  effectiveness =    75% from baseline 25 % > breztri trial  Essential hypertension Changed propranolol to bisoprolol 09/25/2020 > did not do  Dyspnea:  50 ft with rollator  Cough: none  Sleeping: bed is flat/ one pillow  SABA use: as above  02: none and does not check at home  rec No change in medications Breztri  Take 2  puffs first thing in am and then another 2 puffs about 12 hours later.  - 2 at home and 2 samples given today  The key is to stop smoking completely before smoking completely stops you! Make sure you check your oxygen saturation  at your highest level of activity Please schedule a follow up office visit in 4 weeks, sooner if needed  with all medications /inhalers/ solutions in hand so we can verify exactly what you are taking. This includes all medications from all doctors and over the counters      12/05/2020  f/u ov/Alyssa [name] re:  COPD / still smoking / breztri  Chief Complaint  Patient presents with   Follow-up    Breathing is doing better since the last visit. She has been wheezing more over the past few wks and has had minimal cough-non prod. She is using her albuterol inhaler 4 x per day.   Dyspnea:  No change  doe =across the room  Cough: not much Sleeping: bed is flat, one pillow  SABA use: way too much/ very poor insight about how/when to use   02: none  Covid status:  vax x 3  rec Plan A = Automatic = Always=    breztri Take 2 puffs first thing in am and then another 2 puffs about 12 hours later.  Work on inhaler technique:  Plan B = Backup (to supplement plan A, not to replace it) Only use your albuterol inhaler as a rescue medication  Plan C = Crisis (instead of Plan B but only if Plan B stops working) - only use your albuterol nebulizer if you first try Plan B a  key is to stop smoking completely before smoking completely stops you! PFT's on return / has #16 on breztri sample, given 4 more weeks Please schedule a follow up office visit in 4 weeks, call sooner if needed with all medications /inhalers/ solutions in hand so we can verify exactly what you are taking. This includes all medications from all doctors and over the [place] separate them into two bags:  the ones you take automatically, no matter what, vs the ones you take just when you feel you need them "BAG #2 is  UP TO YOU"  - this will really help [place] help you take your medications more effectively.    01/02/2021  f/u ov/ re: copd 0, brought no meds, not clear using breztri 2bid  Chief Complaint  Patient presents with   Follow-up    PFT done today. Breathing is doing well today. She still has some wheezing. She is using her rescue inhaler about every 4 hours.   Dyspnea:  Room to room about same on vs off breztri /no change in saba dependency Cough: none  Sleeping: flat bed/ one pillow  SABA use: way too much hfa and neb / never pre or rechallenges  02: none  Covid status:   vax x 3  Rec Try albuterol (one day the inhaler the next day the neb and the next day nothing) use 15 min before an activity. Stop breztri and see if breathing gets worse or need more  albuterol and restart if it does @ Take 2 puffs first thing in am and then another 2 puffs about 12 hours later.  The key is to stop smoking completely before smoking completely stops you! Pulmonary follow is as needed  -  you must bring all medication/ nebulizer / inhalers with you that you are actually using    Admit date: 06/15/2021 Discharge date: 07/10/2021   Admitted From: [facility] Disposition:  [facility]       Home Health:no  Equipment/Devices: none   Discharge Condition: Stable Code Status:   Code Status: DNR     Brief/Interim Summary: 81 year old female with hypertension, HLD, COPD, PVD, CKD stage IV, COVID-19 infection in 2021, dementia presents with pain all over x3 days along with a rash.  She was seen in the ED creatinine 3.1, troponin 31 chest x-ray left lower lobe collapse COVID-19 negative CT of the chest abdomen pelvis showed left lower lobe collapse without visible mass or adenopathy and femoral bypass graft with increased fluid collection and bypass anastomosis measuring 4.8 X4.1 previously 2.6 X2.6 Patient was given valacyclovir IV fluids and admitted for further management for dyspnea due to left lung collapse,  hypertension urgency, weight loss, borderline elevated troponin, nongap metabolic acidosis, anemia.   Patient was managed with Valtrex,  pain control supplemental oxygen. Seen by PT OT and skilled nursing facility recommended. Pain medication adjusted for pain control ,IR consulted but unable to do nerve block. 9/12-anesthesia was able to inject ropivacaine at T11 level-with some improvement in her pain 9/13-antibiotic were changed to Zosyn -WBC count downtrending. Mild confusion-??  History of dementia may have MCI per son was not diagnosed dementia previously.  Suspecting delirium, CT head 9/14 no acute finding. Patient had fecal impaction constipation that improved with enema since then her abdomen pain is stable 9/16-started weaning down on her scheduled oxycodone, hemoglobin was low 6.5 g needing 1 unit PRBC transfusion. 9/19 patient was noted to have a rectal bleeding seen by GI-suspected and a rectal bleeding with hemorrhoids stercoral ulcer-and per GI colonoscopy would not be helpful and managed with lidocaine jelly as needed HC suppository 2.5% and resume Plavix/asa 9/20. Patient at this time remained stable afebrile on room air, kidney function stable with CKD creatinine 1.9 hemoglobin 7.9.  Patient Was denied SNF level of care at [facility] but she is going back to [facility] at [facility].     Discharge Diagnoses:  Left lower lung collapse/chest to splinting Acute hypoxic respiratory failure 2/2 above Chronic COPD-compensated Continue pain control, bronchodilators, as needed supplemental oxygen outpatient pulmonary follow-up w/ repeat CT chest  in a month-if still not reexpanded then may need inspection bronchoscopy.At risk of developing pneumonia.   Acute Shingles/herpes zoster viral involving up to T11 dermatome(Left flank and abdomen): completed Valtrex.  Rash saline healing, no more on isolation.  Pain management as per palliative care.  Completed oral steroid 9/17. s/p  rupivacaine injection at T11 on 9/12 following which pain significantly better.  Continue oxycodone  5 mg Q8 HR-we will continue to wean down as tolerated, peviously was on 10 mg.  Changed to as needed oxygen on discharge continue Cymbalta, Pepcid, and Lyrica, lidocaine patch.   Mild confusion-??  History of dementia may have MCI per son was not diagnosed dementia previously.  Suspecting delirium, CT head 9/14 no acute finding.  UA grossly abnormal but already covered with antibiotics- possible UTI.  Is at baseline.  Mentation   Rectal bleeding 9/19 a.m: following rectal impaction/constipation needing enema several days ago. Seen by GI-suspected  rectal bleeding with hemorrhoids stercoral ulcer-and per GI colonoscopy would not be helpful and managed with lidocaine jelly prn, HC suppository 2.5%.  No recurrence, tolerating aspirin/Plavix.  GI signed off.  Overall stable.  Monitor closely.   Constipation/fecal impaction: Resolved with enema.holding off on MiraLAX having bowel movement.  We will add a stool softener    Hypertension urgency: Stable continue amlodipine, Coreg, clonidine.  prn oral hydralazine.   CKD stage IV Metabolic  acidosis Hyperkalemia: previous creatinine ~2.6-2.9.renal function improved and stable. off IV fluids, potassium on higher range she will continue with Lokelma oral bicarb and monitor BMP closely  Last Labs          Recent Labs  Lab 07/04/21 0311 07/05/21 0521 07/07/21 0451 07/08/21 0342 07/09/21 0411  BUN 38* 35* 31* 31* 32*  CREATININE 2.81* 2.53* 2.08* 2.08* 1.93*       Last Labs          Recent Labs  Lab 07/04/21 0311 07/05/21 0521 07/07/21 0451 07/08/21 0342 07/09/21 0411  K 4.5 3.7 4.5 5.0 4.8      PVD- Fem fem bypass  in 2011/CT on admission showing fluid collection from pseudoaneurysm, cannot Dr. 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  Cont on  Plavix aspirin-statin.   History of tobacco abuse-has quit few months ago Moderate protein calorie malnutrition: Augment diet as tolerated-change from renal to regular diet to liberalize diet.  RD following   Deconditioning: SNF has been recommended by PT OT and is deconditioned and weak respiratory admission     Anemia of chronic kidney disease:Hb down trended 6.5 gm ?? from hemodilution from IV fluids, s/p 1 unit PRBC ,H&H stable.  But slightly downtrending this morning.  Monitor  Last Labs          Recent Labs  Lab 07/06/21 1625 07/07/21 0451 07/07/21 0922 07/08/21 0342 07/09/21 0411  HGB 7.9* 8.9* 8.9* 8.0* 7.9*  HCT 23.5* 26.1* 26.6* 23.9* 23.3*    Depression with anxiety-stable mood   Goals of care: DNR, seen by palliative care.    07/23/2021  f/u ov/ re: GOLD 0 / AB    maint on just saba tid  Chief Complaint  Patient presents with   Follow-up    Patient was admitted into hospital in August. She states that she is doing good and feels much better. Having shortness of breath with exertion   Dyspnea:  50 ft flat with rollator  Cough: none  Sleeping: no resp cc flat/ one pillow SABA use: hfa too much  02: none  Covid status:   vax x 3 never infected  Rec Plan A = Automatic = Always=    Breztri Take 2 puffs first thing in am and then another 2 puffs about 12 hours later. Work on inhaler technique:   Plan B = Backup (to supplement plan A, not to replace it) Only use your albuterol inhaler as a rescue medication  Call me if your insurance doesn't cover breztri with alternatives (drug formulary)    10/23/2021  f/u ov/ re: ***   maint on ***  No chief complaint on file.   Dyspnea:  *** Cough: *** Sleeping: *** SABA use: *** 02: *** Covid status:   ***   No obvious day to day or daytime variability or assoc excess/ purulent sputum or mucus plugs or hemoptysis or cp or chest tightness, subjective wheeze or overt  sinus or hb symptoms.   *** without nocturnal  or early am exacerbation  of respiratory  c/o's or need for noct saba. Also denies any obvious fluctuation of symptoms with weather or environmental changes or other aggravating or alleviating factors except as outlined above   No unusual exposure hx or h/o childhood pna/ asthma or knowledge of premature birth.  Current Allergies, Complete Past Medical History, Past Surgical History, Family History, and Social History were reviewed in [employer] record.  ROS  The following are not active complaints unless bolded Hoarseness, sore throat, dysphagia, dental problems, itching, sneezing,  nasal congestion or discharge of excess mucus or purulent secretions, ear ache,   fever, chills, sweats, unintended wt loss or wt gain, classically pleuritic or exertional cp,  orthopnea pnd or arm/hand swelling  or leg swelling, presyncope, palpitations, abdominal pain, anorexia, nausea, vomiting, diarrhea  or change in bowel habits or change in bladder habits, change in stools or change in urine, dysuria, hematuria,  rash, arthralgias, visual complaints, headache, numbness, weakness or ataxia or problems with walking or coordination,  change in mood or  memory.        No outpatient medications have been marked  as taking for the 10/23/21 encounter (Appointment) with Tanda Rockers, MD.               Past Medical History:  Diagnosis Date   Acute respiratory failure with hypoxia ([place]) 11/01/2019   Anxiety    Arthritis    Chest pain    Chronic kidney disease    Dyspnea    Elevated troponin 12/13/2018   Headache(784.0)    Hyperlipidemia    Hypertension    Joint pain    Leg pain    Peripheral neuropathy    Peripheral vascular disease (HCC)       Objective:     10/23/2021          ***  07/23/2021        97  01/02/2021        108 12/05/2020        109   11/07/20 103 lb (46.7 kg)  10/17/20 99 lb 6.4 oz (45.1 kg)  10/02/20 100 lb 12  oz (45.7 kg)    Vital signs reviewed  10/23/2021  - Note at rest 02 sats  ***% on ***   General appearance:    ***   Mod barr ***    Assessment

## 2021-10-23 NOTE — Progress Notes (Signed)
PT Cancellation Note  Patient Details Name: Alyssa [name] MRN: [id number] DOB: 17-May-1941   Cancelled Treatment:    Reason Eval/Treat Not Completed: PT screened, no needs identified, will sign off Per OT, pt performing at baseline, and with HHPT follow up. No further skilled PT needs. If needs change, please re-consult.   Lou Miner, DPT  Acute Rehabilitation Services  Pager: [phone] Office: [phone]    Rudean Hitt 10/23/2021, 9:49 AM

## 2021-10-23 NOTE — Progress Notes (Signed)
PROGRESS NOTE        PATIENT DETAILS Name: Alyssa [name] Age: 81 y.o. Sex: female Date of Birth: [birth year] Admit Date: 10/21/2021 Admitting Physician Karmen Bongo, MD PCP:Pcp, No  Brief Narrative: Patient is a 81 y.o. female with history of HTN, HLD, PAD, dementia, CKD stage IV, known right groin pseudoaneurysm-presenting to the hospital with left flank pain, frequency of urination-thought to have pyelonephritis and subsequently admitted to the hospitalist service.  Subjective: Complains of severe pain in the right groin area.  Objective: Vitals: Blood pressure (!) 149/99, pulse 96, temperature 99.4 F (37.4 C), temperature source Oral, resp. rate 15, SpO2 96 %.   Exam: Gen Exam: Not in any distress-appears incredibly frail. HEENT:atraumatic, normocephalic Chest: B/L clear to auscultation anteriorly CVS:S1S2 regular Abdomen:soft non tender, non distended Extremities:no edema Neurology: Non focal-but with generalized weakness. Skin: no rash  Pertinent Labs/Radiology: Recent Labs  Lab 10/21/21 2116 10/23/21 0646  WBC 8.5 7.1  HGB 10.2* 9.0*  PLT 545* 497*  NA 138 136  K 3.8 3.6  CREATININE 2.49* 2.36*  AST 33  --   ALT 22  --   ALKPHOS 95  --   BILITOT 0.6  --     Assessment/Plan: Probable pyelonephritis: Presenting with left flank pain/frequency of urination-but afebrile/no leukocytosis-incredibly frail appearing-continue IV Rocephin-await cultures.  Afebrile-no leukocytosis continue Rocephin  Right groin pseudoaneurysm: Appreciate vascular surgery input-not a candidate for surgical repair.  Recommendations are for supportive care and palliative care input.  PAD: Continue aspirin/Plavix/statin-given poor overall state of health/frailty-do not think any further intervention is warranted/indicated.  Stage IV CKD: Renal function stable and close to baseline-has been deemed not a HD candidate per prior notes.  Had a recent  admission for hyperkalemia-since then maintained on [place].  HTN: BP stable-continue Norvasc, hydralazine.  HLD: Continue Crestor  COPD: Continue inhalers.  Dementia: Appears mild-continue Aricept/amitriptyline/Vistaril and as needed Ativan.  Maintain delirium precautions.  Chronic pain: Continue Lyrica and as needed narcotics/Robaxin.  Continues to have significant right groin pain-we will need further optimization of narcotics.  Palliative care: DNR already in place-appears very frail-clearly not a candidate for aggressive care.  Per vascular surgery not a candidate for repair of pseudoaneurysm of her right groin.  Per prior notes-not a candidate for HD.  Suspect needs gentle medical treatment-and if she has acute decompensation in the future-will benefit most from transitioning to comfort measures.  Likely hospice at home at time of discharge.  We will await further input from palliative care.  BMI Estimated body mass index is 15.66 kg/m as calculated from the following:   Height as of 10/20/21: 5\' 3"  (1.6 m).   Weight as of 10/20/21: 40.1 kg.    Procedures: None Consults: None DVT Prophylaxis: Lovenox Code Status:DNR Family Communication: None at bedside  Time spent: 35- minutes-Greater than 50% of this time was spent in counseling, explanation of diagnosis, planning of further management, and coordination of care.   Disposition Plan: Status is: Observation  The patient will require care spanning > 2 midnights and should be moved to inpatient because: Pyelonephritis-on IV Rocephin-needs inpatient goals of care discussion/pain management optimization before consideration of discharge.    Diet: Diet Order             Diet regular Room service appropriate? Yes; Fluid consistency: Thin  Diet effective now  Antimicrobial agents: Anti-infectives (From admission, onward)    Start     Dose/Rate Route Frequency Ordered Stop   10/22/21 1415   cefTRIAXone (ROCEPHIN) 1 g in sodium chloride 0.9 % 100 mL IVPB        1 g 200 mL/hr over 30 Minutes Intravenous  Once 10/22/21 1409 10/22/21 1508        MEDICATIONS: Scheduled Meds:  amitriptyline  10 mg Oral Daily   amLODipine  10 mg Oral Daily   aspirin  81 mg Oral Daily   clopidogrel  75 mg Oral Daily   dicyclomine  20 mg Oral TID   docusate sodium  100 mg Oral Daily   enoxaparin (LOVENOX) injection  30 mg Subcutaneous Q24H   fluticasone furoate-vilanterol  1 puff Inhalation Daily   hydrALAZINE  50 mg Oral Q8H   hydrOXYzine  25 mg Oral QHS   magnesium oxide  400 mg Oral BID   pregabalin  75 mg Oral Daily   rosuvastatin  40 mg Oral Daily   sodium bicarbonate  1,300 mg Oral BID   sodium chloride flush  3 mL Intravenous Q12H   sodium zirconium cyclosilicate  10 g Oral Once per day on Mon Wed Fri   umeclidinium bromide  1 puff Inhalation Daily   valproic acid  250 mg Oral QHS   Continuous Infusions:  lactated ringers 50 mL/hr at 10/23/21 0752   PRN Meds:.acetaminophen **OR** acetaminophen, albuterol, bisacodyl, hydrALAZINE, HYDROmorphone, LORazepam, methocarbamol, nicotine, ondansetron **OR** ondansetron (ZOFRAN) IV, polyethylene glycol   I have personally reviewed following labs and imaging studies  LABORATORY DATA: CBC: Recent Labs  Lab 10/21/21 2116 10/23/21 0646  WBC 8.5 7.1  NEUTROABS 4.4  --   HGB 10.2* 9.0*  HCT 31.6* 26.5*  MCV 85.6 82.8  PLT 545* 497*    Basic Metabolic Panel: Recent Labs  Lab 10/21/21 2116 10/23/21 0646  NA 138 136  K 3.8 3.6  CL 102 102  CO2 21* 23  GLUCOSE 97 88  BUN 27* 29*  CREATININE 2.49* 2.36*  CALCIUM 9.9 8.9    GFR: Estimated Creatinine Clearance: 12.4 mL/min (A) (by C-G formula based on SCr of 2.36 mg/dL (H)).  Liver Function Tests: Recent Labs  Lab 10/21/21 2116  AST 33  ALT 22  ALKPHOS 95  BILITOT 0.6  PROT 7.4  ALBUMIN 4.0   Recent Labs  Lab 10/21/21 2116  LIPASE 21   No results for input(s):  AMMONIA in the last 168 hours.  Coagulation Profile: No results for input(s): INR, PROTIME in the last 168 hours.  Cardiac Enzymes: No results for input(s): CKTOTAL, CKMB, CKMBINDEX, TROPONINI in the last 168 hours.  BNP (last 3 results) No results for input(s): PROBNP in the last 8760 hours.  Lipid Profile: No results for input(s): CHOL, HDL, LDLCALC, TRIG, CHOLHDL, LDLDIRECT in the last 72 hours.  Thyroid Function Tests: No results for input(s): TSH, T4TOTAL, FREET4, T3FREE, THYROIDAB in the last 72 hours.  Anemia Panel: No results for input(s): VITAMINB12, FOLATE, FERRITIN, TIBC, IRON, RETICCTPCT in the last 72 hours.  Urine analysis:    Component Value Date/Time   COLORURINE YELLOW 10/21/2021 1306   APPEARANCEUR CLOUDY (A) 10/21/2021 1306   LABSPEC 1.013 10/21/2021 1306   PHURINE 6.0 10/21/2021 1306   GLUCOSEU NEGATIVE 10/21/2021 1306   HGBUR NEGATIVE 10/21/2021 1306   BILIRUBINUR NEGATIVE 10/21/2021 1306   KETONESUR 5 (A) 10/21/2021 1306   PROTEINUR >=300 (A) 10/21/2021 1306   UROBILINOGEN 0.2 08/24/2015 1737  NITRITE NEGATIVE 10/21/2021 1306   LEUKOCYTESUR LARGE (A) 10/21/2021 1306    Sepsis Labs: Lactic Acid, Venous    Component Value Date/Time   LATICACIDVEN 1.3 10/21/2021 2116    MICROBIOLOGY: Recent Results (from the past 240 hour(s))  Blood culture (routine x 2)     Status: None (Preliminary result)   Collection Time: 10/21/21  9:15 PM   Specimen: BLOOD  Result Value Ref Range Status   Specimen Description BLOOD LEFT ANTECUBITAL  Final   Special Requests   Final    BOTTLES DRAWN AEROBIC AND ANAEROBIC Blood Culture results may not be optimal due to an inadequate volume of blood received in culture bottles   Culture   Final    NO GROWTH 2 DAYS Performed at [facility], [place] [address], [place], [place] [address]    Report Status PENDING  Incomplete  Blood culture (routine x 2)     Status: None (Preliminary result)   Collection Time: 10/21/21   9:21 PM   Specimen: BLOOD RIGHT HAND  Result Value Ref Range Status   Specimen Description BLOOD RIGHT HAND  Final   Special Requests   Final    BOTTLES DRAWN AEROBIC ONLY Blood Culture adequate volume   Culture   Final    NO GROWTH 2 DAYS Performed at [facility], [place] [address], [place], [place] [address]    Report Status PENDING  Incomplete  Resp Panel by RT-PCR (Flu A&B, Covid) Nasopharyngeal Swab     Status: None   Collection Time: 10/22/21  2:15 PM   Specimen: Nasopharyngeal Swab; Nasopharyngeal(NP) swabs in vial transport medium  Result Value Ref Range Status   SARS Coronavirus 2 by RT PCR NEGATIVE NEGATIVE Final    Comment: (NOTE) SARS-CoV-2 target nucleic acids are NOT DETECTED.  The SARS-CoV-2 RNA is generally detectable in upper respiratory specimens during the acute phase of infection. The lowest concentration of SARS-CoV-2 viral copies this assay can detect is 138 copies/mL. A negative result does not preclude SARS-Cov-2 infection and should not be used as the sole basis for treatment or other patient management decisions. A negative result may occur with  improper specimen collection/handling, submission of specimen other than nasopharyngeal swab, presence of viral mutation(s) within the areas targeted by this assay, and inadequate number of viral copies(<138 copies/mL). A negative result must be combined with clinical observations, patient history, and epidemiological information. The expected result is Negative.  Fact Sheet for Patients:  EntrepreneurPulse.com.au  Fact Sheet for Healthcare Providers:  IncredibleEmployment.be  This test is no t yet approved or cleared by the [place] FDA and  has been authorized for detection and/or diagnosis of SARS-CoV-2 by FDA under an Emergency Use Authorization (EUA). This EUA will remain  in effect (meaning this test can be used) for the duration of the COVID-19 declaration  under Section 564(b)(1) of the Act, 21 U.S.C.section 360bbb-3(b)(1), unless the authorization is terminated  or revoked sooner.       Influenza A by PCR NEGATIVE NEGATIVE Final   Influenza B by PCR NEGATIVE NEGATIVE Final    Comment: (NOTE) The Xpert Xpress SARS-CoV-2/FLU/RSV plus assay is intended as an aid in the diagnosis of influenza from Nasopharyngeal swab specimens and should not be used as a sole basis for treatment. Nasal washings and aspirates are unacceptable for Xpert Xpress SARS-CoV-2/FLU/RSV testing.  Fact Sheet for Patients: EntrepreneurPulse.com.au  Fact Sheet for Healthcare Providers: IncredibleEmployment.be  This test is not yet approved or cleared by the  [place] Architectural technologist and has been authorized for detection and/or diagnosis of SARS-CoV-2 by FDA under an Print production planner (EUA). This EUA will remain in effect (meaning this test can be used) for the duration of the COVID-19 declaration under Section 564(b)(1) of the Act, 21 U.S.C. section 360bbb-3(b)(1), unless the authorization is terminated or revoked.  Performed at [facility], [place] [address], [place], [place] [address]     RADIOLOGY STUDIES/RESULTS: VAS [place] ABI WITH/WO TBI  Result Date: 10/22/2021  LOWER EXTREMITY DOPPLER STUDY Patient Name:  MORANDA BILLIOT  Date of Exam:   10/22/2021 Medical Rec #: [id number]             Accession #:    [id number] Date of Birth: 07/26/1941            Patient Gender: F Patient Age:   64 years Exam Location:  [facility] Procedure:      VAS [place] ABI WITH/WO TBI Referring Phys: Jamelle Haring --------------------------------------------------------------------------------  Indications: Peripheral artery disease. High Risk Factors: Hypertension, hyperlipidemia.  Vascular               Femoral-femoral bypass grafting in 2011, lost to follow Interventions:         up. Comparison Study: Prior ABI done 09/30/11 Performing  Technologist: Sharion Dove RVS  Examination Guidelines: A complete evaluation includes at minimum, Doppler waveform signals and systolic blood pressure reading at the level of bilateral brachial, anterior tibial, and posterior tibial arteries, when vessel segments are accessible. Bilateral testing is considered an integral part of a complete examination. Photoelectric Plethysmograph (PPG) waveforms and toe systolic pressure readings are included as required and additional duplex testing as needed. Limited examinations for reoccurring indications may be performed as noted.  ABI Findings: +---------+------------------+-----+-----------+--------+  Right     Rt Pressure (mmHg) Index Waveform    Comment   +---------+------------------+-----+-----------+--------+  Brachial  198                      multiphasic           +---------+------------------+-----+-----------+--------+  PTA       97                 0.49  monophasic            +---------+------------------+-----+-----------+--------+  DP        95                 0.48  monophasic            +---------+------------------+-----+-----------+--------+  Great Toe 79                 0.40                        +---------+------------------+-----+-----------+--------+ +---------+------------------+-----+-----------+-------+  Left      Lt Pressure (mmHg) Index Waveform    Comment  +---------+------------------+-----+-----------+-------+  Brachial  193                      multiphasic          +---------+------------------+-----+-----------+-------+  PTA       85                 0.43  monophasic           +---------+------------------+-----+-----------+-------+  DP        117  0.59  monophasic           +---------+------------------+-----+-----------+-------+  Great Toe 99                 0.50                       +---------+------------------+-----+-----------+-------+ +-------+-----------+-----------+------------+------------+  ABI/TBI Today's  ABI Today's TBI Previous ABI Previous TBI  +-------+-----------+-----------+------------+------------+  Right   0.49        0.40        0.94                       +-------+-----------+-----------+------------+------------+  Left    0.59        0.50        0.94                       +-------+-----------+-----------+------------+------------+ Bilateral ABIs appear decreased compared to prior study on 09/30/11.  Summary: Right: Resting right ankle-brachial index indicates severe right lower extremity arterial disease. The right toe-brachial index is abnormal. Left: Resting left ankle-brachial index indicates moderate left lower extremity arterial disease. The left toe-brachial index is abnormal.  *See table(s) above for measurements and observations.  Electronically signed by Jamelle Haring on 10/22/2021 at 4:59:29 PM.    Final    CT Renal Stone Study  Result Date: 10/21/2021 CLINICAL DATA:  Flank pain.  Concern for kidney stone. EXAM: CT ABDOMEN AND PELVIS WITHOUT CONTRAST TECHNIQUE: Multidetector CT imaging of the abdomen and pelvis was performed following the standard protocol without IV contrast. COMPARISON:  CT of the abdomen pelvis dated 09/15/2021. FINDINGS: Evaluation of this exam is limited in the absence of intravenous contrast. Evaluation is also limited due to streak artifact caused by right hip arthroplasty and spinal hardware as well as respiratory motion. Lower chest: Minimal bibasilar atelectasis. There is coronary vascular calcification. No intra-abdominal free air or free fluid. Hepatobiliary: No focal liver abnormality is seen. No gallstones, gallbladder wall thickening, or biliary dilatation. Pancreas: The pancreas is moderately atrophic. No active inflammatory changes. Spleen: Normal in size without focal abnormality. Adrenals/Urinary Tract: The adrenal glands are grossly unremarkable. Mild right renal parenchyma atrophy. A 6 mm calcific focus over the left renal hilum, likely vascular  calcification. No hydronephrosis or nephrolithiasis. Small bilateral renal hypodense lesions are not characterized on this CT. The visualized ureters and urinary bladder appear unremarkable. Stomach/Bowel: There is severe sigmoid diverticulosis. There is a small hiatal hernia. No bowel obstruction. The appendix is normal. Vascular/Lymphatic: Advanced aortoiliac atherosclerotic disease. The IVC is grossly unremarkable. No portal venous gas. There is no adenopathy. Reproductive: The uterus is poorly visualized. Probable small calcified uterine fibroid. 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 Musculoskeletal: Right hip arthroplasty. Osteopenia with degenerative changes of the spine. Lower lumbar fixation hardware. No acute osseous pathology. IMPRESSION: 1. No acute intra-abdominal or pelvic pathology. No hydronephrosis or nephrolithiasis. 2. Severe sigmoid diverticulosis. No bowel obstruction. Normal appendix. 3. Aortic Atherosclerosis (ICD10-I70.0). Electronically Signed   By: Anner Crete M.D.   On: 10/21/2021 21:57   VAS [place] LOWER EXTREMITY ARTERIAL DUPLEX  Result Date: 10/22/2021 LOWER EXTREMITY ARTERIAL DUPLEX STUDY Patient Name:  MONTINA DORRANCE  Date of Exam:   10/22/2021 Medical Rec #: [id number]             Accession #:    [id number]  Date of Birth: 1941-02-06            Patient Gender: F Patient Age:   32 years Exam [facility] Procedure:      VAS [place] LOWER EXTREMITY ARTERIAL DUPLEX Referring Phys: Jamelle Haring --------------------------------------------------------------------------------  Indications: Peripheral artery disease, and Known active pseudoaneurysm. High Risk Factors: Hypertension, hyperlipidemia.  Vascular               Femoral-femoral bypass grafting in 2011, lost to follow Interventions:         up. Current ABI:           R:0.49 L:0.59 Comparison Study: Prior LE bypass graft  duplex exam indicating patent graft done                   09/30/11 Performing Technologist: Sharion Dove RVS  Examination Guidelines: A complete evaluation includes B-mode imaging, spectral Doppler, color Doppler, and power Doppler as needed of all accessible portions of each vessel. Bilateral testing is considered an integral part of a complete examination. Limited examinations for reoccurring indications may be performed as noted.  +----------+--------+-----+--------+----------+--------------------------+  RIGHT      PSV cm/s Ratio Stenosis Waveform   Comments                    +----------+--------+-----+--------+----------+--------------------------+  CFA Prox   96                      monophasic                             +----------+--------+-----+--------+----------+--------------------------+  DFA        26                      monophasic multiple collaterals noted  +----------+--------+-----+--------+----------+--------------------------+  SFA Prox   67                      monophasic                             +----------+--------+-----+--------+----------+--------------------------+  SFA Mid    69                      monophasic                             +----------+--------+-----+--------+----------+--------------------------+  SFA Distal 107                     monophasic                             +----------+--------+-----+--------+----------+--------------------------+  POP Prox   45                      monophasic                             +----------+--------+-----+--------+----------+--------------------------+  POP Mid    49                      monophasic                             +----------+--------+-----+--------+----------+--------------------------+  PTA Prox   46                      monophasic                             +----------+--------+-----+--------+----------+--------------------------+  PTA Mid    20                      monophasic                              +----------+--------+-----+--------+----------+--------------------------+ 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   Summary: Right: Diffuse atherosclerotic plaque noted throughout. 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  See table(s) above for measurements and observations. Electronically signed by Jamelle Haring on 10/22/2021 at 5:00:29 PM.    Final      LOS: 0 days   Oren Binet, MD  [facility]    To contact the attending provider between 7A-7P or the covering provider during after hours 7P-7A, please log into the web site [email or web address] and access using universal [place] password for that web site. If you do not have the password, please call the hospital operator.  10/23/2021, 9:19 AM

## 2021-10-24 DIAGNOSIS — N3000 Acute cystitis without hematuria: Secondary | ICD-10-CM | POA: Diagnosis not present

## 2021-10-24 DIAGNOSIS — R109 Unspecified abdominal pain: Secondary | ICD-10-CM | POA: Diagnosis not present

## 2021-10-24 DIAGNOSIS — I724 Aneurysm of artery of lower extremity: Secondary | ICD-10-CM | POA: Diagnosis not present

## 2021-10-24 DIAGNOSIS — G894 Chronic pain syndrome: Secondary | ICD-10-CM | POA: Diagnosis not present

## 2021-10-24 DIAGNOSIS — N184 Chronic kidney disease, stage 4 (severe): Secondary | ICD-10-CM | POA: Diagnosis not present

## 2021-10-24 LAB — BASIC METABOLIC PANEL
Anion gap: 10 (ref 5–15)
BUN: 29 mg/dL — ABNORMAL HIGH (ref 8–23)
CO2: 22 mmol/L (ref 22–32)
Calcium: 8.9 mg/dL (ref 8.9–10.3)
Chloride: 103 mmol/L (ref 98–111)
Creatinine, Ser: 2.26 mg/dL — ABNORMAL HIGH (ref 0.44–1.00)
GFR, Estimated: 22 mL/min — ABNORMAL LOW (ref >60)
Glucose, Bld: 98 mg/dL (ref 70–99)
Potassium: 4.5 mmol/L (ref 3.5–5.1)
Sodium: 135 mmol/L (ref 135–145)

## 2021-10-24 MED ORDER — HYDROMORPHONE HCL 2 MG PO TABS
1.0000 mg | ORAL_TABLET | ORAL | 0 refills | Status: DC | PRN
Start: 2021-10-24 — End: 2021-11-12

## 2021-10-24 MED ORDER — CEFDINIR 300 MG PO CAPS: 300.0000 mg | ORAL_CAPSULE | Freq: Every day | ORAL | 0 refills | Status: DC

## 2021-10-24 MED ORDER — POLYETHYLENE GLYCOL 3350 17 G PO PACK: 17.0000 g | PACK | Freq: Every day | ORAL | 0 refills | Status: DC

## 2021-10-24 MED ORDER — LORAZEPAM 0.5 MG PO TABS: 0.5000 mg | ORAL_TABLET | Freq: Three times a day (TID) | ORAL | 0 refills | Status: DC | PRN

## 2021-10-24 NOTE — Discharge Summary (Signed)
PATIENT DETAILS Name: Alyssa [name] Age: 81 y.o. Sex: female Date of Birth: Nov 24, 1940 MRN: [id number]. Admitting Physician: Karmen Bongo, MD PCP:Pcp, No  Admit Date: 10/21/2021 Discharge date: 10/24/2021  Recommendations for Outpatient Follow-up:  Follow up with PCP in 1-2 weeks Being discharged home with hospice-consider transitioning to full comfort measures if she acutely decompensates in the future.  Admitted From:  Home  Disposition: Home with home [place]:  No  Equipment/Devices: None  Discharge Condition: Stable  CODE STATUS: DNR  Diet recommendation:  Diet Order             Diet - low sodium heart healthy           Diet regular Room service appropriate? Yes; Fluid consistency: Thin  Diet effective now                    Brief Summary: Patient is a 81 y.o. female with history of HTN, HLD, PAD, dementia, CKD stage IV, known right groin pseudoaneurysm-presenting to the hospital with left flank pain, frequency of urination-thought to have pyelonephritis and subsequently admitted to the hospitalist service.  Brief Hospital Course: Probable pyelonephritis: Presenting with left flank pain/frequency of urination-but afebrile/no leukocytosis-urine cultures showing multiple bacterial morphotypes.  Initially placed on Rocephin-seems to have improved somewhat-less frequency of urination-minimal left flank pain-we will transition to [facility] on discharge.   Right groin pseudoaneurysm: Appreciate vascular surgery input-not a candidate for surgical repair.  Continue supportive care-palliative care evaluation appreciated-Home hospice on discharge.     PAD: Continue aspirin/Plavix/statin-given poor overall state of health/frailty-do not think any further intervention is warranted/indicated.  Stage IV CKD: Renal function stable and close to baseline-has been deemed not a HD candidate per prior notes.  Had a recent admission for hyperkalemia-since  then maintained on [place].   HTN: BP stable-continue Norvasc, hydralazine.   HLD: Continue Crestor  COPD: Continue inhalers.  Dementia: Appears mild-continue Aricept/amitriptyline/Vistaril and as needed Ativan.  Maintain delirium precautions.   Chronic pain: Continue Lyrica and as needed narcotics/Robaxin.  Per palliative care recommendation-Will be discharged on Dilaudid for pain control and comfort.   Palliative care: DNR already in place-appears very frail-clearly not a candidate for aggressive care.  Per vascular surgery not a candidate for repair of pseudoaneurysm of her right groin.  Per prior notes-not a candidate for HD.  Evaluated by palliative care-Home hospice being arranged on discharge-suspect will benefit from transitioning to full comfort measures if she suffers from a acute decompensation in the future.   BMI Estimated body mass index is 15.66 kg/m as calculated from the following:   Height as of 10/20/21: 5\' 3"  (1.6 m).   Weight as of 10/20/21: 40.1 kg.     Procedures None  Discharge Diagnoses:  Principal Problem:   Flank pain Active Problems:   PVD (peripheral vascular disease) (HCC)   CKD (chronic kidney disease) stage 4, GFR 15-29 ml/min (HCC)   Chronic pain syndrome   DNR (do not resuscitate)   Dementia ([place])   Hyperkalemia   Essential hypertension   COPD  GOLD 0/ still smoking    Cigarette smoker   Pseudoaneurysm of femoral artery following procedure [facility])   Discharge Instructions:  Activity:  As tolerated with Full fall precautions use walker/cane & assistance as needed   Discharge Instructions     Diet - low sodium heart healthy   Complete by: As directed    Discharge instructions   Complete by: As directed  Follow with Primary MD  Pcp, No in 1-2 weeks  Please get a complete blood count and chemistry panel checked by your Primary MD at your next visit, and again as instructed by your Primary MD.  Get Medicines reviewed and  adjusted: Please take all your medications with you for your next visit with your Primary MD  Laboratory/radiological data: Please request your Primary MD to go over all hospital tests and procedure/radiological results at the follow up, please ask your Primary MD to get all Hospital records sent to his/her office.  In some cases, they will be blood work, cultures and biopsy results pending at the time of your discharge. Please request that your primary care M.D. follows up on these results.  Also Note the following: If you experience worsening of your admission symptoms, develop shortness of breath, life threatening emergency, suicidal or homicidal thoughts you must seek medical attention immediately by calling 911 or calling your MD immediately  if symptoms less severe.  You must read complete instructions/literature along with all the possible adverse reactions/side effects for all the Medicines you take and that have been prescribed to you. Take any new Medicines after you have completely understood and accpet all the possible adverse reactions/side effects.   Do not drive when taking Pain medications or sleeping medications (Benzodaizepines)  Do not take more than prescribed Pain, Sleep and Anxiety Medications. It is not advisable to combine anxiety,sleep and pain medications without talking with your primary care practitioner  Special Instructions: If you have smoked or chewed Tobacco  in the last 2 yrs please stop smoking, stop any regular Alcohol  and or any Recreational drug use.  Wear Seat belts while driving.  Please note: You were cared for by a hospitalist during your hospital stay. Once you are discharged, your primary care physician will handle any further medical issues. Please note that NO REFILLS for any discharge medications will be authorized once you are discharged, as it is imperative that you return to your primary care physician (or establish a relationship with a  primary care physician if you do not have one) for your post hospital discharge needs so that they can reassess your need for medications and monitor your lab values.   Increase activity slowly   Complete by: As directed       Allergies as of 10/24/2021   No Known Allergies      Medication List     STOP taking these medications    docusate sodium 100 MG capsule Commonly known as: COLACE       TAKE these medications    albuterol 108 (90 Base) MCG/ACT inhaler Commonly known as: VENTOLIN HFA Inhale 2 puffs into the lungs every 4 (four) hours as needed for wheezing or shortness of breath.   amitriptyline 10 MG tablet Commonly known as: ELAVIL Take 10 mg by mouth daily.   amLODipine 10 MG tablet Commonly known as: NORVASC Take 1 tablet (10 mg total) by mouth daily.   aspirin 81 MG EC tablet Take 1 tablet (81 mg total) by mouth daily.   Breztri Aerosphere 160-9-4.8 MCG/ACT Aero Generic drug: Budeson-Glycopyrrol-Formoterol Inhale 2 puffs into the lungs 2 (two) times daily.   cefdinir 300 MG capsule Commonly known as: OMNICEF Take 1 capsule (300 mg total) by mouth daily.   CertaVite/Antioxidants Tabs Take 1 tablet by mouth at bedtime.   clopidogrel 75 MG tablet Commonly known as: PLAVIX Take 1 tablet (75 mg total) by mouth daily.   diclofenac  Sodium 1 % Gel Commonly known as: VOLTAREN Apply 2 g topically every 6 (six) hours as needed (knee pain).   dicyclomine 20 MG tablet Commonly known as: BENTYL Take 20 mg by mouth 3 (three) times daily.   Ensure Plus Liqd Take 237 mLs by mouth daily.   hydrALAZINE 50 MG tablet Commonly known as: APRESOLINE Take 1 tablet (50 mg total) by mouth every 8 (eight) hours.   HYDROmorphone 2 MG tablet Commonly known as: DILAUDID Take 0.5-1 tablets (1-2 mg total) by mouth every 4 (four) hours as needed for severe pain or moderate pain.   hydrOXYzine 25 MG tablet Commonly known as: ATARAX Take 25 mg by mouth at bedtime.    Iron High-Potency 325 MG Tabs Take 325 mg by mouth daily with breakfast. What changed: when to take this   lidocaine 5 % Commonly known as: LIDODERM Place 1 patch onto the skin daily.   Lokelma 10 g Pack packet Generic drug: sodium zirconium cyclosilicate Take 10 g by mouth 3 (three) times a week. Do not take this within 2 hours of other medications   LORazepam 0.5 MG tablet Commonly known as: ATIVAN Take 1 tablet (0.5 mg total) by mouth every 8 (eight) hours as needed for anxiety or sleep. What changed: when to take this   magnesium oxide 400 MG tablet Commonly known as: MAG-OX Take 400 mg by mouth 2 (two) times daily.   methocarbamol 500 MG tablet Commonly known as: ROBAXIN Take 500 mg by mouth 3 (three) times daily as needed for muscle spasms.   nitroGLYCERIN 0.4 MG SL tablet Commonly known as: NITROSTAT Place 1 tablet (0.4 mg total) under the tongue every 5 (five) minutes as needed for chest pain.   polyethylene glycol 17 g packet Commonly known as: MiraLax Take 17 g by mouth daily.   pregabalin 75 MG capsule Commonly known as: LYRICA Take 1 capsule (75 mg total) by mouth daily.   rosuvastatin 40 MG tablet Commonly known as: CRESTOR Take 40 mg by mouth daily.   sodium bicarbonate 650 MG tablet Take 2 tablets (1,300 mg total) by mouth 2 (two) times daily.   valproic acid 250 MG capsule Commonly known as: DEPAKENE Take 250 mg by mouth at bedtime.        Follow-up Information     Troy Sine, MD. Schedule an appointment as soon as possible for a visit in 1 week(s).   Specialty: Cardiology Contact information: [address] [place] [address] [phone]                No Known Allergies    Consultations:  vascular surgery and   Palliative care   Other Procedures/Studies: DG Chest Port 1 View  Result Date: 09/26/2021 CLINICAL DATA:  Weakness EXAM: PORTABLE CHEST 1 VIEW COMPARISON:  09/10/2021 FINDINGS: Stable  cardiomediastinal contours. Probable mild atelectasis at the right lung base. No pleural effusion. IMPRESSION: Mild atelectasis right lung base. Electronically Signed   By: Macy Mis M.D.   On: 09/26/2021 17:50   VAS [place] ABI WITH/WO TBI  Result Date: 10/22/2021  LOWER EXTREMITY DOPPLER STUDY Patient Name:  BRITTANNIE TAWNEY  Date of Exam:   10/22/2021 Medical Rec #: [id number]             Accession #:    [id number] Date of Birth: 1941/08/07            Patient Gender: F Patient Age:   89 years Exam Location:  [facility] Procedure:      VAS [place] ABI WITH/WO TBI Referring Phys: Jamelle Haring --------------------------------------------------------------------------------  Indications: Peripheral artery disease. High Risk Factors: Hypertension, hyperlipidemia.  Vascular               Femoral-femoral bypass grafting in 2011, lost to follow Interventions:         up. Comparison Study: Prior ABI done 09/30/11 Performing Technologist: Sharion Dove RVS  Examination Guidelines: A complete evaluation includes at minimum, Doppler waveform signals and systolic blood pressure reading at the level of bilateral brachial, anterior tibial, and posterior tibial arteries, when vessel segments are accessible. Bilateral testing is considered an integral part of a complete examination. Photoelectric Plethysmograph (PPG) waveforms and toe systolic pressure readings are included as required and additional duplex testing as needed. Limited examinations for reoccurring indications may be performed as noted.  ABI Findings: +---------+------------------+-----+-----------+--------+  Right     Rt Pressure (mmHg) Index Waveform    Comment   +---------+------------------+-----+-----------+--------+  Brachial  198                      multiphasic           +---------+------------------+-----+-----------+--------+  PTA       97                 0.49  monophasic            +---------+------------------+-----+-----------+--------+  DP         95                 0.48  monophasic            +---------+------------------+-----+-----------+--------+  Great Toe 79                 0.40                        +---------+------------------+-----+-----------+--------+ +---------+------------------+-----+-----------+-------+  Left      Lt Pressure (mmHg) Index Waveform    Comment  +---------+------------------+-----+-----------+-------+  Brachial  193                      multiphasic          +---------+------------------+-----+-----------+-------+  PTA       85                 0.43  monophasic           +---------+------------------+-----+-----------+-------+  DP        117                0.59  monophasic           +---------+------------------+-----+-----------+-------+  Great Toe 99                 0.50                       +---------+------------------+-----+-----------+-------+ +-------+-----------+-----------+------------+------------+  ABI/TBI Today's ABI Today's TBI Previous ABI Previous TBI  +-------+-----------+-----------+------------+------------+  Right   0.49        0.40        0.94                       +-------+-----------+-----------+------------+------------+  Left    0.59        0.50        0.94                       +-------+-----------+-----------+------------+------------+  Bilateral ABIs appear decreased compared to prior study on 09/30/11.  Summary: Right: Resting right ankle-brachial index indicates severe right lower extremity arterial disease. The right toe-brachial index is abnormal. Left: Resting left ankle-brachial index indicates moderate left lower extremity arterial disease. The left toe-brachial index is abnormal.  *See table(s) above for measurements and observations.  Electronically signed by Jamelle Haring on 10/22/2021 at 4:59:29 PM.    Final    CT Renal Stone Study  Result Date: 10/21/2021 CLINICAL DATA:  Flank pain.  Concern for kidney stone. EXAM: CT ABDOMEN AND PELVIS WITHOUT CONTRAST TECHNIQUE: Multidetector CT  imaging of the abdomen and pelvis was performed following the standard protocol without IV contrast. COMPARISON:  CT of the abdomen pelvis dated 09/15/2021. FINDINGS: Evaluation of this exam is limited in the absence of intravenous contrast. Evaluation is also limited due to streak artifact caused by right hip arthroplasty and spinal hardware as well as respiratory motion. Lower chest: Minimal bibasilar atelectasis. There is coronary vascular calcification. No intra-abdominal free air or free fluid. Hepatobiliary: No focal liver abnormality is seen. No gallstones, gallbladder wall thickening, or biliary dilatation. Pancreas: The pancreas is moderately atrophic. No active inflammatory changes. Spleen: Normal in size without focal abnormality. Adrenals/Urinary Tract: The adrenal glands are grossly unremarkable. Mild right renal parenchyma atrophy. A 6 mm calcific focus over the left renal hilum, likely vascular calcification. No hydronephrosis or nephrolithiasis. Small bilateral renal hypodense lesions are not characterized on this CT. The visualized ureters and urinary bladder appear unremarkable. Stomach/Bowel: There is severe sigmoid diverticulosis. There is a small hiatal hernia. No bowel obstruction. The appendix is normal. Vascular/Lymphatic: Advanced aortoiliac atherosclerotic disease. The IVC is grossly unremarkable. No portal venous gas. There is no adenopathy. Reproductive: The uterus is poorly visualized. Probable small calcified uterine fibroid. 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 Musculoskeletal: Right hip arthroplasty. Osteopenia with degenerative changes of the spine. Lower lumbar fixation hardware. No acute osseous pathology. IMPRESSION: 1. No acute intra-abdominal or pelvic pathology. No hydronephrosis or nephrolithiasis. 2. Severe sigmoid diverticulosis. No bowel obstruction. Normal appendix. 3. Aortic  Atherosclerosis (ICD10-I70.0). Electronically Signed   By: Anner Crete M.D.   On: 10/21/2021 21:57   VAS [place] LOWER EXTREMITY ARTERIAL DUPLEX  Result Date: 10/22/2021 LOWER EXTREMITY ARTERIAL DUPLEX STUDY Patient Name:  TREZURE CRONK  Date of Exam:   10/22/2021 Medical Rec #: [id number]             Accession #:    [id number] Date of Birth: Jan 26, 1941            Patient Gender: F Patient Age:   8 years Exam Location:  [facility] Procedure:      VAS [place] LOWER EXTREMITY ARTERIAL DUPLEX Referring Phys: Jamelle Haring --------------------------------------------------------------------------------  Indications: Peripheral artery disease, and Known active pseudoaneurysm. High Risk Factors: Hypertension, hyperlipidemia.  Vascular               Femoral-femoral bypass grafting in 2011, lost to follow Interventions:         up. Current ABI:           R:0.49 L:0.59 Comparison Study: Prior LE bypass graft duplex exam indicating patent graft done                   09/30/11 Performing Technologist: Sharion Dove RVS  Examination Guidelines: A complete evaluation includes B-mode imaging, spectral Doppler, color Doppler, and power  Doppler as needed of all accessible portions of each vessel. Bilateral testing is considered an integral part of a complete examination. Limited examinations for reoccurring indications may be performed as noted.  +----------+--------+-----+--------+----------+--------------------------+  RIGHT      PSV cm/s Ratio Stenosis Waveform   Comments                    +----------+--------+-----+--------+----------+--------------------------+  CFA Prox   96                      monophasic                             +----------+--------+-----+--------+----------+--------------------------+  DFA        26                      monophasic multiple collaterals noted  +----------+--------+-----+--------+----------+--------------------------+  SFA Prox   67                      monophasic                              +----------+--------+-----+--------+----------+--------------------------+  SFA Mid    69                      monophasic                             +----------+--------+-----+--------+----------+--------------------------+  SFA Distal 107                     monophasic                             +----------+--------+-----+--------+----------+--------------------------+  POP Prox   45                      monophasic                             +----------+--------+-----+--------+----------+--------------------------+  POP Mid    49                      monophasic                             +----------+--------+-----+--------+----------+--------------------------+  PTA Prox   46                      monophasic                             +----------+--------+-----+--------+----------+--------------------------+  PTA Mid    20                      monophasic                             +----------+--------+-----+--------+----------+--------------------------+ 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   Summary: Right: Diffuse atherosclerotic plaque noted throughout. Monophasic waveforms suggestive of iliofemoral disease. Partially occluded proximal profunda femoral artery with collateralization noted.  Pseudoaneurysm noted in the right groin, measuring 2.26 cm X  2.34 cm.  See table(s) above for measurements and observations. Electronically signed by Jamelle Haring on 10/22/2021 at 5:00:29 PM.    Final      TODAY-DAY OF DISCHARGE:  Subjective:   Nevin Bloodgood today has no headache,no chest abdominal pain,no new weakness tingling or numbness, feels much better wants to go home today.   Objective:   Blood pressure 137/87, pulse 98, temperature 98.4 F (36.9 C), temperature source Oral, resp. rate 18, height 5\' 3"  (1.6 m), weight 42.1 kg, SpO2 98 %.  Intake/Output Summary (Last 24 hours) at 10/24/2021 0957 Last  data filed at 10/24/2021 0900 Gross per 24 hour  Intake 671.92 ml  Output 250 ml  Net 421.92 ml   Filed Weights   10/23/21 2007  Weight: 42.1 kg    Exam: Awake Alert, Oriented *3, No new F.N deficits, Normal affect [place].AT,PERRAL Supple Neck,No JVD, No cervical lymphadenopathy appriciated.  Symmetrical Chest wall movement, Good air movement bilaterally, CTAB RRR,No Gallops,Rubs or new Murmurs, No Parasternal Heave +ve B.Sounds, Abd Soft, Non tender, No organomegaly appriciated, No rebound -guarding or rigidity. No Cyanosis, Clubbing or edema, No new Rash or bruise   PERTINENT RADIOLOGIC STUDIES: VAS [place] ABI WITH/WO TBI  Result Date: 10/22/2021  LOWER EXTREMITY DOPPLER STUDY Patient Name:  LESHEA JAGGERS  Date of Exam:   10/22/2021 Medical Rec #: [id number]             Accession #:    [id number] Date of Birth: 1941/01/15            Patient Gender: F Patient Age:   41 years Exam Location:  [facility] Procedure:      VAS [place] ABI WITH/WO TBI Referring Phys: Jamelle Haring --------------------------------------------------------------------------------  Indications: Peripheral artery disease. High Risk Factors: Hypertension, hyperlipidemia.  Vascular               Femoral-femoral bypass grafting in 2011, lost to follow Interventions:         up. Comparison Study: Prior ABI done 09/30/11 Performing Technologist: Sharion Dove RVS  Examination Guidelines: A complete evaluation includes at minimum, Doppler waveform signals and systolic blood pressure reading at the level of bilateral brachial, anterior tibial, and posterior tibial arteries, when vessel segments are accessible. Bilateral testing is considered an integral part of a complete examination. Photoelectric Plethysmograph (PPG) waveforms and toe systolic pressure readings are included as required and additional duplex testing as needed. Limited examinations for reoccurring indications may be performed as noted.  ABI Findings:  +---------+------------------+-----+-----------+--------+  Right     Rt Pressure (mmHg) Index Waveform    Comment   +---------+------------------+-----+-----------+--------+  Brachial  198                      multiphasic           +---------+------------------+-----+-----------+--------+  PTA       97                 0.49  monophasic            +---------+------------------+-----+-----------+--------+  DP        95                 0.48  monophasic            +---------+------------------+-----+-----------+--------+  Great Toe 79                 0.40                        +---------+------------------+-----+-----------+--------+ +---------+------------------+-----+-----------+-------+  Left      Lt Pressure (mmHg) Index Waveform    Comment  +---------+------------------+-----+-----------+-------+  Brachial  193                      multiphasic          +---------+------------------+-----+-----------+-------+  PTA       85                 0.43  monophasic           +---------+------------------+-----+-----------+-------+  DP        117                0.59  monophasic           +---------+------------------+-----+-----------+-------+  Great Toe 99                 0.50                       +---------+------------------+-----+-----------+-------+ +-------+-----------+-----------+------------+------------+  ABI/TBI Today's ABI Today's TBI Previous ABI Previous TBI  +-------+-----------+-----------+------------+------------+  Right   0.49        0.40        0.94                       +-------+-----------+-----------+------------+------------+  Left    0.59        0.50        0.94                       +-------+-----------+-----------+------------+------------+ Bilateral ABIs appear decreased compared to prior study on 09/30/11.  Summary: Right: Resting right ankle-brachial index indicates severe right lower extremity arterial disease. The right toe-brachial index is abnormal. Left: Resting left ankle-brachial index  indicates moderate left lower extremity arterial disease. The left toe-brachial index is abnormal.  *See table(s) above for measurements and observations.  Electronically signed by Jamelle Haring on 10/22/2021 at 4:59:29 PM.    Final    VAS [place] LOWER EXTREMITY ARTERIAL DUPLEX  Result Date: 10/22/2021 LOWER EXTREMITY ARTERIAL DUPLEX STUDY Patient Name:  MYRTLE HALLER  Date of Exam:   10/22/2021 Medical Rec #: [id number]             Accession #:    [id number] Date of Birth: [birth year]            Patient Gender: F Patient Age:   19 years Exam [facility]  [facility] Procedure:      VAS [place] LOWER EXTREMITY ARTERIAL DUPLEX Referring Phys: Jamelle Haring --------------------------------------------------------------------------------  Indications: Peripheral artery disease, and Known active pseudoaneurysm. High Risk Factors: Hypertension, hyperlipidemia.  Vascular               Femoral-femoral bypass grafting in 2011, lost to follow Interventions:         up. 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 Bilateral testing is considered an integral part of a complete examination. Limited examinations for reoccurring indications may be performed as noted.  +----------+--------+-----+--------+----------+--------------------------+  RIGHT  PSV cm/s Ratio Stenosis Waveform   Comments                    +----------+--------+-----+--------+----------+--------------------------+  CFA Prox   96                      monophasic                             +----------+--------+-----+--------+----------+--------------------------+  DFA        26                      monophasic multiple collaterals noted   +----------+--------+-----+--------+----------+--------------------------+  SFA Prox   67                      monophasic                             +----------+--------+-----+--------+----------+--------------------------+  SFA Mid    69                      monophasic                             +----------+--------+-----+--------+----------+--------------------------+  SFA Distal 107                     monophasic                             +----------+--------+-----+--------+----------+--------------------------+  POP Prox   45                      monophasic                             +----------+--------+-----+--------+----------+--------------------------+  POP Mid    49                      monophasic                             +----------+--------+-----+--------+----------+--------------------------+  PTA Prox   46                      monophasic                             +----------+--------+-----+--------+----------+--------------------------+  PTA Mid    20                      monophasic                             +----------+--------+-----+--------+----------+--------------------------+ 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   Summary: Right: Diffuse atherosclerotic plaque noted throughout. 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  See table(s) above for measurements and observations. Electronically signed by Jamelle Haring on 10/22/2021 at 5:00:29 PM.    Final  PERTINENT LAB RESULTS: CBC: Recent Labs    10/21/21 2116 10/23/21 0646  WBC 8.5 7.1  HGB 10.2* 9.0*  HCT 31.6* 26.5*  PLT 545* 497*   CMET CMP     Component Value Date/Time   NA 135 10/24/2021 0341   K 4.5 10/24/2021 0341   CL 103 10/24/2021 0341   CO2 22 10/24/2021 0341    GLUCOSE 98 10/24/2021 0341   BUN 29 (H) 10/24/2021 0341   CREATININE 2.26 (H) 10/24/2021 0341   CALCIUM 8.9 10/24/2021 0341   PROT 7.4 10/21/2021 2116   ALBUMIN 4.0 10/21/2021 2116   AST 33 10/21/2021 2116   ALT 22 10/21/2021 2116   ALKPHOS 95 10/21/2021 2116   BILITOT 0.6 10/21/2021 2116   GFRNONAA 22 (L) 10/24/2021 0341   GFRAA 30 (L) 07/10/2020 0137    GFR Estimated Creatinine Clearance: 13.6 mL/min (A) (by C-G formula based on SCr of 2.26 mg/dL (H)). Recent Labs    10/21/21 2116  LIPASE 21   No results for input(s): CKTOTAL, CKMB, CKMBINDEX, TROPONINI in the last 72 hours. Invalid input(s): POCBNP No results for input(s): DDIMER in the last 72 hours. No results for input(s): HGBA1C in the last 72 hours. No results for input(s): CHOL, HDL, LDLCALC, TRIG, CHOLHDL, LDLDIRECT in the last 72 hours. No results for input(s): TSH, T4TOTAL, T3FREE, THYROIDAB in the last 72 hours.  Invalid input(s): FREET3 No results for input(s): VITAMINB12, FOLATE, FERRITIN, TIBC, IRON, RETICCTPCT in the last 72 hours. Coags: No results for input(s): INR in the last 72 hours.  Invalid input(s): PT Microbiology: Recent Results (from the past 240 hour(s))  Urine Culture     Status: Abnormal   Collection Time: 10/21/21  8:55 PM   Specimen: Urine, Clean Catch  Result Value Ref Range Status   Specimen Description URINE, CLEAN CATCH  Final   Special Requests   Final    NONE Performed at [facility], [place] [address] [place], [place] [address]    Culture MULTIPLE SPECIES PRESENT, SUGGEST RECOLLECTION (A)  Final   Report Status 10/23/2021 FINAL  Final  Blood culture (routine x 2)     Status: None (Preliminary result)   Collection Time: 10/21/21  9:15 PM   Specimen: BLOOD  Result Value Ref Range Status   Specimen Description BLOOD LEFT ANTECUBITAL  Final   Special Requests   Final    BOTTLES DRAWN AEROBIC AND ANAEROBIC Blood Culture results may not be optimal due to an inadequate  volume of blood received in culture bottles   Culture   Final    NO GROWTH 3 DAYS Performed at [facility], [place] [address] [place], [place] [address]    Report Status PENDING  Incomplete  Blood culture (routine x 2)     Status: None (Preliminary result)   Collection Time: 10/21/21  9:21 PM   Specimen: BLOOD RIGHT HAND  Result Value Ref Range Status   Specimen Description BLOOD RIGHT HAND  Final   Special Requests   Final    BOTTLES DRAWN AEROBIC ONLY Blood Culture adequate volume   Culture   Final    NO GROWTH 3 DAYS Performed at [facility], [place] [address], [place], [place] [address]    Report Status PENDING  Incomplete  Resp Panel by RT-PCR (Flu A&B, Covid) Nasopharyngeal Swab     Status: None   Collection Time: 10/22/21  2:15 PM   Specimen: Nasopharyngeal Swab; Nasopharyngeal(NP) swabs in vial transport medium  Result Value Ref Range Status   SARS Coronavirus 2 by RT PCR NEGATIVE NEGATIVE Final    Comment: (NOTE) SARS-CoV-2 target nucleic acids are NOT DETECTED.  The SARS-CoV-2 RNA is generally detectable in upper respiratory specimens during the acute phase of infection. The lowest concentration of SARS-CoV-2 viral copies this assay can detect is 138 copies/mL. A negative result does not preclude SARS-Cov-2 infection and should not be used as the sole basis for treatment or other patient management decisions. A negative result may occur with  improper specimen collection/handling, submission of specimen other than nasopharyngeal swab, presence of viral mutation(s) within the areas targeted by this assay, and inadequate number of viral copies(<138 copies/mL). A negative result must be combined with clinical observations, patient history, and epidemiological information. The expected result is Negative.  Fact Sheet for Patients:  EntrepreneurPulse.com.au  Fact Sheet for Healthcare Providers:   IncredibleEmployment.be  This test is no t yet approved or cleared by the [place] FDA and  has been authorized for detection and/or diagnosis of SARS-CoV-2 by FDA under an Emergency Use Authorization (EUA). This EUA will remain  in effect (meaning this test can be used) for the duration of the COVID-19 declaration under Section 564(b)(1) of the Act, 21 U.S.C.section 360bbb-3(b)(1), unless the authorization is terminated  or revoked sooner.       Influenza A by PCR NEGATIVE NEGATIVE Final   Influenza B by PCR NEGATIVE NEGATIVE Final    Comment: (NOTE) The Xpert Xpress SARS-CoV-2/FLU/RSV plus assay is intended as an aid in the diagnosis of influenza from Nasopharyngeal swab specimens and should not be used as a sole basis for treatment. Nasal washings and aspirates are unacceptable for Xpert Xpress SARS-CoV-2/FLU/RSV testing.  Fact Sheet for Patients: EntrepreneurPulse.com.au  Fact Sheet for Healthcare Providers: IncredibleEmployment.be  This test is not yet approved or cleared by the [place] FDA and has been authorized for detection and/or diagnosis of SARS-CoV-2 by FDA under an Emergency Use Authorization (EUA). This EUA will remain in effect (meaning this test can be used) for the duration of the COVID-19 declaration under Section 564(b)(1) of the Act, 21 U.S.C. section 360bbb-3(b)(1), unless the authorization is terminated or revoked.  Performed at [facility], [place] [address] [place],  [address]     FURTHER DISCHARGE INSTRUCTIONS:  Get Medicines reviewed and adjusted: Please take all your medications with you for your next visit with your Primary MD  Laboratory/radiological data: Please request your Primary MD to go over all hospital tests and procedure/radiological results at the follow up, please ask your Primary MD to get all Hospital records sent to his/her office.  In some cases,  they will be blood work, cultures and biopsy results pending at the time of your discharge. Please request that your primary care M.D. goes through all the records of your hospital data and follows up on these results.  Also Note the following: If you experience worsening of your admission symptoms, develop shortness of breath, life threatening emergency, suicidal or homicidal thoughts you must seek medical attention immediately by calling 911 or calling your MD immediately  if symptoms less severe.  You must read complete instructions/literature along with all the possible adverse reactions/side effects for all the Medicines you take and that have been prescribed to you. Take any new Medicines after you have completely understood and accpet all the possible adverse reactions/side effects.   Do not drive when taking Pain medications or sleeping medications (Benzodaizepines)  Do not take more  than prescribed Pain, Sleep and Anxiety Medications. It is not advisable to combine anxiety,sleep and pain medications without talking with your primary care practitioner  Special Instructions: If you have smoked or chewed Tobacco  in the last 2 yrs please stop smoking, stop any regular Alcohol  and or any Recreational drug use.  Wear Seat belts while driving.  Please note: You were cared for by a hospitalist during your hospital stay. Once you are discharged, your primary care physician will handle any further medical issues. Please note that NO REFILLS for any discharge medications will be authorized once you are discharged, as it is imperative that you return to your primary care physician (or establish a relationship with a primary care physician if you do not have one) for your post hospital discharge needs so that they can reassess your need for medications and monitor your lab values.  Total Time spent coordinating discharge including counseling, education and face to face time equals 35  minutes.  SignedOren Binet 10/24/2021 9:57 AM

## 2021-10-24 NOTE — Progress Notes (Signed)
[facility])  Referral received for hospice services with [facility].   Pt currently under hospice services with [facility].  No DME needed.  [facility] will f/u in the home with Ms. Brobeck and her family.  Thank you, Venia Carbon RN, BSN, [facility] Liaison

## 2021-10-24 NOTE — TOC Transition Note (Addendum)
Transition of Care [facility]) - CM/SW Discharge Note   Patient Details  Name: Alyssa [name] MRN: [id number] Date of Birth: 17-Aug-1941  [facility] CM/SW Contact:  Bartholomew Crews, RN Phone Number: [phone] 10/24/2021, 9:34 AM   Clinical Narrative:     Patient to transition home today. Spoke with patient's son, Alyssa [name], to discuss transition needs. He is in the process of changing hospice agency to [facility]. He will provide transportation home. No further TOC needs identified at this time.   Update: Received call from [facility], Tim. Patient is not switching agencies. Alyssa [name] misunderstood the conversation that when patient was residential hospice eligible that transition to [facility] could be arranged. Octavia Bruckner has spoken with Alyssa [name]. Hospice care at home will continue with [facility] - hospice RN to visit patient today after transition home.   Final next level of care: Home w Hospice Care Barriers to Discharge: No Barriers Identified   Patient Goals and CMS Choice Patient states their goals for this hospitalization and ongoing recovery are:: home with hospice care CMS Medicare.gov Compare Post Acute Care list provided to:: Patient Represenative (must comment) Alyssa [name]) Choice offered to / list presented to : Adult Children  Discharge Placement                       Discharge Plan and Services                DME Arranged: N/A DME Agency: NA       HH Arranged: NA HH Agency: NA        Social Determinants of Health (SDOH) Interventions     Readmission Risk Interventions Readmission Risk Prevention Plan 10/09/2021 07/10/2020  Transportation Screening Complete Complete  Medication Review Press photographer) Complete Complete  PCP or Specialist appointment within 3-5 days of discharge Complete -  [place] or [place] Complete Complete  SW Recovery Care/Counseling Consult Complete Complete  Palliative Care Screening Not Applicable Not  [place] Not Applicable Not Applicable  Some recent data might be hidden

## 2021-10-25 DIAGNOSIS — I729 Aneurysm of unspecified site: Secondary | ICD-10-CM

## 2021-10-26 LAB — CULTURE, BLOOD (ROUTINE X 2)
Culture: NO GROWTH
Culture: NO GROWTH
Special Requests: ADEQUATE

## 2021-11-02 ENCOUNTER — Other Ambulatory Visit: Payer: Self-pay

## 2021-11-02 ENCOUNTER — Encounter (HOSPITAL_COMMUNITY): Payer: Self-pay

## 2021-11-02 ENCOUNTER — Emergency Department (HOSPITAL_COMMUNITY): Payer: Medicare Other

## 2021-11-02 ENCOUNTER — Ambulatory Visit: Payer: Medicare HMO | Admitting: Surgery

## 2021-11-02 ENCOUNTER — Inpatient Hospital Stay (HOSPITAL_COMMUNITY)
Admission: EM | Admit: 2021-11-02 | Discharge: 2021-11-12 | DRG: 190 | Disposition: A | Discharge status: Skilled Nursing Facility | Payer: Medicare Other | Attending: Internal Medicine | Admitting: Internal Medicine

## 2021-11-02 DIAGNOSIS — J440 Chronic obstructive pulmonary disease with acute lower respiratory infection: Secondary | ICD-10-CM | POA: Diagnosis present

## 2021-11-02 DIAGNOSIS — Z515 Encounter for palliative care: Secondary | ICD-10-CM

## 2021-11-02 DIAGNOSIS — Z20822 Contact with and (suspected) exposure to covid-19: Secondary | ICD-10-CM | POA: Diagnosis present

## 2021-11-02 DIAGNOSIS — Z681 Body mass index (BMI) 19 or less, adult: Secondary | ICD-10-CM | POA: Diagnosis not present

## 2021-11-02 DIAGNOSIS — E785 Hyperlipidemia, unspecified: Secondary | ICD-10-CM | POA: Diagnosis present

## 2021-11-02 DIAGNOSIS — I728 Aneurysm of other specified arteries: Secondary | ICD-10-CM | POA: Diagnosis present

## 2021-11-02 DIAGNOSIS — Z7951 Long term (current) use of inhaled steroids: Secondary | ICD-10-CM | POA: Diagnosis not present

## 2021-11-02 DIAGNOSIS — G629 Polyneuropathy, unspecified: Secondary | ICD-10-CM | POA: Diagnosis present

## 2021-11-02 DIAGNOSIS — I10 Essential (primary) hypertension: Secondary | ICD-10-CM | POA: Diagnosis not present

## 2021-11-02 DIAGNOSIS — F0390 Unspecified dementia without behavioral disturbance: Secondary | ICD-10-CM | POA: Diagnosis present

## 2021-11-02 DIAGNOSIS — E43 Unspecified severe protein-calorie malnutrition: Secondary | ICD-10-CM | POA: Diagnosis present

## 2021-11-02 DIAGNOSIS — Z83438 Family history of other disorder of lipoprotein metabolism and other lipidemia: Secondary | ICD-10-CM

## 2021-11-02 DIAGNOSIS — Z7189 Other specified counseling: Secondary | ICD-10-CM | POA: Diagnosis not present

## 2021-11-02 DIAGNOSIS — J40 Bronchitis, not specified as acute or chronic: Secondary | ICD-10-CM

## 2021-11-02 DIAGNOSIS — R0602 Shortness of breath: Secondary | ICD-10-CM

## 2021-11-02 DIAGNOSIS — I739 Peripheral vascular disease, unspecified: Secondary | ICD-10-CM | POA: Diagnosis present

## 2021-11-02 DIAGNOSIS — R64 Cachexia: Secondary | ICD-10-CM | POA: Diagnosis present

## 2021-11-02 DIAGNOSIS — Z66 Do not resuscitate: Secondary | ICD-10-CM | POA: Diagnosis present

## 2021-11-02 DIAGNOSIS — J209 Acute bronchitis, unspecified: Secondary | ICD-10-CM | POA: Diagnosis present

## 2021-11-02 DIAGNOSIS — F17210 Nicotine dependence, cigarettes, uncomplicated: Secondary | ICD-10-CM | POA: Diagnosis present

## 2021-11-02 DIAGNOSIS — Z789 Other specified health status: Secondary | ICD-10-CM | POA: Diagnosis not present

## 2021-11-02 DIAGNOSIS — M1990 Unspecified osteoarthritis, unspecified site: Secondary | ICD-10-CM | POA: Diagnosis present

## 2021-11-02 DIAGNOSIS — E874 Mixed disorder of acid-base balance: Secondary | ICD-10-CM | POA: Diagnosis present

## 2021-11-02 DIAGNOSIS — G8929 Other chronic pain: Secondary | ICD-10-CM | POA: Diagnosis present

## 2021-11-02 DIAGNOSIS — Z96641 Presence of right artificial hip joint: Secondary | ICD-10-CM | POA: Diagnosis present

## 2021-11-02 DIAGNOSIS — E875 Hyperkalemia: Secondary | ICD-10-CM | POA: Diagnosis present

## 2021-11-02 DIAGNOSIS — D631 Anemia in chronic kidney disease: Secondary | ICD-10-CM | POA: Diagnosis present

## 2021-11-02 DIAGNOSIS — R52 Pain, unspecified: Secondary | ICD-10-CM | POA: Diagnosis not present

## 2021-11-02 DIAGNOSIS — Z7902 Long term (current) use of antithrombotics/antiplatelets: Secondary | ICD-10-CM | POA: Diagnosis not present

## 2021-11-02 DIAGNOSIS — N184 Chronic kidney disease, stage 4 (severe): Secondary | ICD-10-CM | POA: Diagnosis present

## 2021-11-02 DIAGNOSIS — J441 Chronic obstructive pulmonary disease with (acute) exacerbation: Secondary | ICD-10-CM | POA: Diagnosis present

## 2021-11-02 DIAGNOSIS — R54 Age-related physical debility: Secondary | ICD-10-CM | POA: Diagnosis present

## 2021-11-02 DIAGNOSIS — I129 Hypertensive chronic kidney disease with stage 1 through stage 4 chronic kidney disease, or unspecified chronic kidney disease: Secondary | ICD-10-CM | POA: Diagnosis present

## 2021-11-02 DIAGNOSIS — J189 Pneumonia, unspecified organism: Secondary | ICD-10-CM | POA: Diagnosis not present

## 2021-11-02 DIAGNOSIS — Z7982 Long term (current) use of aspirin: Secondary | ICD-10-CM

## 2021-11-02 DIAGNOSIS — R627 Adult failure to thrive: Secondary | ICD-10-CM | POA: Diagnosis present

## 2021-11-02 DIAGNOSIS — Z79899 Other long term (current) drug therapy: Secondary | ICD-10-CM

## 2021-11-02 DIAGNOSIS — Z8249 Family history of ischemic heart disease and other diseases of the circulatory system: Secondary | ICD-10-CM

## 2021-11-02 LAB — I-STAT VENOUS BLOOD GAS, ED
Acid-base deficit: 6 mmol/L — ABNORMAL HIGH (ref 0.0–2.0)
Bicarbonate: 17.6 mmol/L — ABNORMAL LOW (ref 20.0–28.0)
Calcium, Ion: 1.19 mmol/L (ref 1.15–1.40)
HCT: 26 % — ABNORMAL LOW (ref 36.0–46.0)
Hemoglobin: 8.8 g/dL — ABNORMAL LOW (ref 12.0–15.0)
O2 Saturation: 95 %
Potassium: 5 mmol/L (ref 3.5–5.1)
Sodium: 143 mmol/L (ref 135–145)
TCO2: 18 mmol/L — ABNORMAL LOW (ref 22–32)
pCO2, Ven: 28.9 mmHg — ABNORMAL LOW (ref 44.0–60.0)
pH, Ven: 7.392 (ref 7.250–7.430)
pO2, Ven: 76 mmHg — ABNORMAL HIGH (ref 32.0–45.0)

## 2021-11-02 LAB — CBC WITH DIFFERENTIAL/PLATELET
Abs Immature Granulocytes: 0.04 10*3/uL (ref 0.00–0.07)
Basophils Absolute: 0.1 10*3/uL (ref 0.0–0.1)
Basophils Relative: 1 %
Eosinophils Absolute: 0 10*3/uL (ref 0.0–0.5)
Eosinophils Relative: 0 %
HCT: 39.9 % (ref 36.0–46.0)
Hemoglobin: 12.7 g/dL (ref 12.0–15.0)
Immature Granulocytes: 1 %
Lymphocytes Relative: 16 %
Lymphs Abs: 1.4 10*3/uL (ref 0.7–4.0)
MCH: 26.8 pg (ref 26.0–34.0)
MCHC: 31.8 g/dL (ref 30.0–36.0)
MCV: 84.4 fL (ref 80.0–100.0)
Monocytes Absolute: 0.5 10*3/uL (ref 0.1–1.0)
Monocytes Relative: 5 %
Neutro Abs: 6.4 10*3/uL (ref 1.7–7.7)
Neutrophils Relative %: 77 %
Platelets: 352 10*3/uL (ref 150–400)
RBC: 4.73 MIL/uL (ref 3.87–5.11)
RDW: 18 % — ABNORMAL HIGH (ref 11.5–15.5)
WBC: 8.3 10*3/uL (ref 4.0–10.5)
nRBC: 0 % (ref 0.0–0.2)

## 2021-11-02 LAB — URINALYSIS, ROUTINE W REFLEX MICROSCOPIC
Bilirubin Urine: NEGATIVE
Glucose, UA: NEGATIVE mg/dL
Hgb urine dipstick: NEGATIVE
Ketones, ur: NEGATIVE mg/dL
Leukocytes,Ua: NEGATIVE
Nitrite: NEGATIVE
Protein, ur: 100 mg/dL — AB
Specific Gravity, Urine: 1.02 (ref 1.005–1.030)
pH: 8 (ref 5.0–8.0)

## 2021-11-02 LAB — COMPREHENSIVE METABOLIC PANEL
ALT: 16 U/L (ref 0–44)
AST: 39 U/L (ref 15–41)
Albumin: 3.1 g/dL — ABNORMAL LOW (ref 3.5–5.0)
Alkaline Phosphatase: 79 U/L (ref 38–126)
Anion gap: 15 (ref 5–15)
BUN: 37 mg/dL — ABNORMAL HIGH (ref 8–23)
CO2: 16 mmol/L — ABNORMAL LOW (ref 22–32)
Calcium: 9.2 mg/dL (ref 8.9–10.3)
Chloride: 111 mmol/L (ref 98–111)
Creatinine, Ser: 2.73 mg/dL — ABNORMAL HIGH (ref 0.44–1.00)
GFR, Estimated: 17 mL/min — ABNORMAL LOW (ref >60)
Glucose, Bld: 101 mg/dL — ABNORMAL HIGH (ref 70–99)
Potassium: 5.8 mmol/L — ABNORMAL HIGH (ref 3.5–5.1)
Sodium: 142 mmol/L (ref 135–145)
Total Bilirubin: 0.8 mg/dL (ref 0.3–1.2)
Total Protein: 6.8 g/dL (ref 6.5–8.1)

## 2021-11-02 LAB — PROTIME-INR
INR: 0.9 (ref 0.8–1.2)
Prothrombin Time: 12.5 seconds (ref 11.4–15.2)

## 2021-11-02 LAB — LACTIC ACID, PLASMA: Lactic Acid, Venous: 0.9 mmol/L (ref 0.5–1.9)

## 2021-11-02 LAB — URINALYSIS, MICROSCOPIC (REFLEX)

## 2021-11-02 LAB — RESP PANEL BY RT-PCR (FLU A&B, COVID) ARPGX2
Influenza A by PCR: NEGATIVE
Influenza B by PCR: NEGATIVE
SARS Coronavirus 2 by RT PCR: NEGATIVE

## 2021-11-02 LAB — APTT: aPTT: 30 seconds (ref 24–36)

## 2021-11-02 MED ORDER — ENSURE ENLIVE PO LIQD
237.0000 mL | Freq: Two times a day (BID) | ORAL | Status: DC
  Administered 2021-11-02 – 2021-11-04 (×5): 237 mL via ORAL
  Filled 2021-11-02: qty 237

## 2021-11-02 MED ORDER — POLYETHYLENE GLYCOL 3350 17 G PO PACK
17.0000 g | PACK | Freq: Every day | ORAL | Status: DC
  Administered 2021-11-02 – 2021-11-12 (×7): 17 g via ORAL
  Filled 2021-11-02 (×9): qty 1

## 2021-11-02 MED ORDER — DRONABINOL 2.5 MG PO CAPS
2.5000 mg | ORAL_CAPSULE | Freq: Two times a day (BID) | ORAL | Status: DC
  Administered 2021-11-02 – 2021-11-12 (×20): 2.5 mg via ORAL
  Filled 2021-11-02 (×20): qty 1

## 2021-11-02 MED ORDER — ADULT MULTIVITAMIN W/MINERALS CH
1.0000 | ORAL_TABLET | Freq: Every day | ORAL | Status: DC
  Administered 2021-11-02 – 2021-11-11 (×10): 1 via ORAL
  Filled 2021-11-02 (×10): qty 1

## 2021-11-02 MED ORDER — HYDRALAZINE HCL 50 MG PO TABS
50.0000 mg | ORAL_TABLET | Freq: Three times a day (TID) | ORAL | Status: DC
  Administered 2021-11-02 – 2021-11-12 (×30): 50 mg via ORAL
  Filled 2021-11-02 (×3): qty 1
  Filled 2021-11-02: qty 2
  Filled 2021-11-02 (×10): qty 1
  Filled 2021-11-02: qty 2
  Filled 2021-11-02 (×4): qty 1
  Filled 2021-11-02 (×2): qty 2
  Filled 2021-11-02 (×8): qty 1

## 2021-11-02 MED ORDER — ALBUTEROL SULFATE (2.5 MG/3ML) 0.083% IN NEBU: 2.5000 mg | INHALATION_SOLUTION | RESPIRATORY_TRACT | Status: DC | PRN

## 2021-11-02 MED ORDER — ONDANSETRON HCL 4 MG PO TABS: 4.0000 mg | ORAL_TABLET | Freq: Four times a day (QID) | ORAL | Status: DC | PRN

## 2021-11-02 MED ORDER — METHYLPREDNISOLONE SODIUM SUCC 125 MG IJ SOLR
125.0000 mg | Freq: Once | INTRAMUSCULAR | Status: AC
  Administered 2021-11-02: 125 mg via INTRAVENOUS
  Filled 2021-11-02: qty 2

## 2021-11-02 MED ORDER — PREDNISONE 20 MG PO TABS
40.0000 mg | ORAL_TABLET | Freq: Every day | ORAL | Status: DC
  Filled 2021-11-02: qty 2

## 2021-11-02 MED ORDER — CLOPIDOGREL BISULFATE 75 MG PO TABS
75.0000 mg | ORAL_TABLET | Freq: Every day | ORAL | Status: DC
Start: 2021-11-02 — End: 2021-11-12
  Administered 2021-11-02 – 2021-11-12 (×11): 75 mg via ORAL
  Filled 2021-11-02 (×12): qty 1

## 2021-11-02 MED ORDER — BUDESON-GLYCOPYRROL-FORMOTEROL 160-9-4.8 MCG/ACT IN AERO: 2.0000 | INHALATION_SPRAY | Freq: Two times a day (BID) | RESPIRATORY_TRACT | Status: DC

## 2021-11-02 MED ORDER — HYDROMORPHONE HCL 2 MG PO TABS
1.0000 mg | ORAL_TABLET | ORAL | Status: DC | PRN
  Administered 2021-11-02 – 2021-11-12 (×10): 2 mg via ORAL
  Filled 2021-11-02 (×11): qty 1

## 2021-11-02 MED ORDER — LACTATED RINGERS IV SOLN: INTRAVENOUS | Status: AC

## 2021-11-02 MED ORDER — UMECLIDINIUM BROMIDE 62.5 MCG/ACT IN AEPB
1.0000 | INHALATION_SPRAY | Freq: Every day | RESPIRATORY_TRACT | Status: DC
  Administered 2021-11-03 – 2021-11-09 (×7): 1 via RESPIRATORY_TRACT
  Filled 2021-11-02 (×2): qty 7

## 2021-11-02 MED ORDER — ACETAMINOPHEN 325 MG PO TABS
650.0000 mg | ORAL_TABLET | Freq: Four times a day (QID) | ORAL | Status: DC | PRN
  Administered 2021-11-06 – 2021-11-10 (×3): 650 mg via ORAL
  Filled 2021-11-02 (×3): qty 2

## 2021-11-02 MED ORDER — SODIUM CHLORIDE 0.9 % IV SOLN
2.0000 g | INTRAVENOUS | Status: AC
  Administered 2021-11-02 – 2021-11-06 (×5): 2 g via INTRAVENOUS
  Filled 2021-11-02 (×5): qty 20

## 2021-11-02 MED ORDER — FLUTICASONE FUROATE-VILANTEROL 100-25 MCG/ACT IN AEPB
1.0000 | INHALATION_SPRAY | Freq: Every day | RESPIRATORY_TRACT | Status: DC
  Administered 2021-11-03 – 2021-11-04 (×2): 1 via RESPIRATORY_TRACT
  Filled 2021-11-02: qty 28

## 2021-11-02 MED ORDER — ROSUVASTATIN CALCIUM 20 MG PO TABS
40.0000 mg | ORAL_TABLET | Freq: Every day | ORAL | Status: DC
  Administered 2021-11-02 – 2021-11-12 (×11): 40 mg via ORAL
  Filled 2021-11-02 (×11): qty 2

## 2021-11-02 MED ORDER — AMLODIPINE BESYLATE 10 MG PO TABS
10.0000 mg | ORAL_TABLET | Freq: Every day | ORAL | Status: DC
Start: 2021-11-02 — End: 2021-11-12
  Administered 2021-11-02 – 2021-11-12 (×11): 10 mg via ORAL
  Filled 2021-11-02 (×7): qty 1
  Filled 2021-11-02 (×2): qty 2
  Filled 2021-11-02 (×2): qty 1

## 2021-11-02 MED ORDER — LORAZEPAM 0.5 MG PO TABS: 0.5000 mg | ORAL_TABLET | Freq: Three times a day (TID) | ORAL | Status: DC | PRN

## 2021-11-02 MED ORDER — SODIUM BICARBONATE 650 MG PO TABS: 1300.0000 mg | ORAL_TABLET | Freq: Two times a day (BID) | ORAL | Status: DC

## 2021-11-02 MED ORDER — ONDANSETRON HCL 4 MG/2ML IJ SOLN
4.0000 mg | Freq: Four times a day (QID) | INTRAMUSCULAR | Status: DC | PRN
  Filled 2021-11-02: qty 2

## 2021-11-02 MED ORDER — LACTATED RINGERS IV BOLUS (SEPSIS)
1000.0000 mL | Freq: Once | INTRAVENOUS | Status: AC
  Administered 2021-11-02: 1000 mL via INTRAVENOUS

## 2021-11-02 MED ORDER — SODIUM BICARBONATE 650 MG PO TABS
1300.0000 mg | ORAL_TABLET | Freq: Three times a day (TID) | ORAL | Status: DC
  Administered 2021-11-03 – 2021-11-12 (×28): 1300 mg via ORAL
  Filled 2021-11-02 (×28): qty 2

## 2021-11-02 MED ORDER — OXYCODONE HCL 5 MG PO TABS
5.0000 mg | ORAL_TABLET | Freq: Once | ORAL | Status: AC
  Administered 2021-11-02: 5 mg via ORAL
  Filled 2021-11-02: qty 1

## 2021-11-02 MED ORDER — SODIUM CHLORIDE 0.9 % IV SOLN: 500.0000 mg | INTRAVENOUS | Status: DC

## 2021-11-02 MED ORDER — CARVEDILOL 3.125 MG PO TABS
3.1250 mg | ORAL_TABLET | Freq: Two times a day (BID) | ORAL | Status: DC
  Administered 2021-11-02 – 2021-11-12 (×20): 3.125 mg via ORAL
  Filled 2021-11-02 (×20): qty 1

## 2021-11-02 MED ORDER — VALPROIC ACID 250 MG PO CAPS
250.0000 mg | ORAL_CAPSULE | Freq: Every day | ORAL | Status: DC
  Administered 2021-11-02 – 2021-11-11 (×10): 250 mg via ORAL
  Filled 2021-11-02 (×15): qty 1

## 2021-11-02 MED ORDER — IPRATROPIUM-ALBUTEROL 0.5-2.5 (3) MG/3ML IN SOLN
3.0000 mL | Freq: Once | RESPIRATORY_TRACT | Status: AC
Start: 2021-11-02 — End: 2021-11-02
  Administered 2021-11-02: 3 mL via RESPIRATORY_TRACT
  Filled 2021-11-02: qty 3

## 2021-11-02 MED ORDER — ASPIRIN EC 81 MG PO TBEC
81.0000 mg | DELAYED_RELEASE_TABLET | Freq: Every day | ORAL | Status: DC
  Administered 2021-11-02 – 2021-11-12 (×11): 81 mg via ORAL
  Filled 2021-11-02 (×11): qty 1

## 2021-11-02 MED ORDER — HEPARIN SODIUM (PORCINE) 5000 UNIT/ML IJ SOLN
5000.0000 [IU] | Freq: Two times a day (BID) | INTRAMUSCULAR | Status: DC
  Administered 2021-11-02 – 2021-11-12 (×20): 5000 [IU] via SUBCUTANEOUS
  Filled 2021-11-02 (×20): qty 1

## 2021-11-02 MED ORDER — IPRATROPIUM BROMIDE 0.02 % IN SOLN
0.5000 mg | Freq: Four times a day (QID) | RESPIRATORY_TRACT | Status: DC
  Administered 2021-11-02 – 2021-11-04 (×4): 0.5 mg via RESPIRATORY_TRACT
  Filled 2021-11-02 (×5): qty 2.5

## 2021-11-02 MED ORDER — MAGNESIUM OXIDE -MG SUPPLEMENT 400 (240 MG) MG PO TABS
400.0000 mg | ORAL_TABLET | Freq: Two times a day (BID) | ORAL | Status: DC
  Administered 2021-11-02 – 2021-11-12 (×20): 400 mg via ORAL
  Filled 2021-11-02 (×20): qty 1

## 2021-11-02 MED ORDER — FERROUS SULFATE 325 (65 FE) MG PO TABS
325.0000 mg | ORAL_TABLET | Freq: Every day | ORAL | Status: DC
  Administered 2021-11-03 – 2021-11-12 (×10): 325 mg via ORAL
  Filled 2021-11-02 (×10): qty 1

## 2021-11-02 MED ORDER — SODIUM ZIRCONIUM CYCLOSILICATE 10 G PO PACK
10.0000 g | PACK | Freq: Every day | ORAL | Status: DC
  Administered 2021-11-02 – 2021-11-05 (×4): 10 g via ORAL
  Filled 2021-11-02 (×4): qty 1

## 2021-11-02 MED ORDER — AMITRIPTYLINE HCL 10 MG PO TABS
10.0000 mg | ORAL_TABLET | Freq: Every day | ORAL | Status: DC
  Administered 2021-11-02 – 2021-11-12 (×11): 10 mg via ORAL
  Filled 2021-11-02 (×11): qty 1

## 2021-11-02 MED ORDER — ACETAMINOPHEN 650 MG RE SUPP: 650.0000 mg | Freq: Four times a day (QID) | RECTAL | Status: DC | PRN

## 2021-11-02 MED ORDER — PREGABALIN 75 MG PO CAPS
75.0000 mg | ORAL_CAPSULE | Freq: Every day | ORAL | Status: DC
Start: 2021-11-02 — End: 2021-11-12
  Administered 2021-11-02 – 2021-11-12 (×11): 75 mg via ORAL
  Filled 2021-11-02: qty 3
  Filled 2021-11-02 (×7): qty 1
  Filled 2021-11-02: qty 3
  Filled 2021-11-02 (×2): qty 1

## 2021-11-02 MED ORDER — LACTATED RINGERS IV BOLUS (SEPSIS)
500.0000 mL | Freq: Once | INTRAVENOUS | Status: AC
  Administered 2021-11-02: 500 mL via INTRAVENOUS

## 2021-11-02 MED ORDER — DICLOFENAC SODIUM 1 % EX GEL
2.0000 g | Freq: Four times a day (QID) | CUTANEOUS | Status: DC | PRN
  Filled 2021-11-02: qty 100

## 2021-11-02 MED ORDER — SODIUM CHLORIDE 0.9 % IV SOLN
500.0000 mg | INTRAVENOUS | Status: DC
  Administered 2021-11-02 – 2021-11-05 (×4): 500 mg via INTRAVENOUS
  Filled 2021-11-02 (×4): qty 5

## 2021-11-02 MED ORDER — HYDROXYZINE HCL 25 MG PO TABS
25.0000 mg | ORAL_TABLET | Freq: Every day | ORAL | Status: DC
  Administered 2021-11-02 – 2021-11-11 (×10): 25 mg via ORAL
  Filled 2021-11-02 (×10): qty 1

## 2021-11-02 MED ORDER — SODIUM CHLORIDE 0.9 % IV SOLN: 2.0000 g | INTRAVENOUS | Status: DC

## 2021-11-02 MED ORDER — DICYCLOMINE HCL 20 MG PO TABS
20.0000 mg | ORAL_TABLET | Freq: Three times a day (TID) | ORAL | Status: DC
  Administered 2021-11-02 – 2021-11-12 (×29): 20 mg via ORAL
  Filled 2021-11-02 (×36): qty 1

## 2021-11-02 MED ORDER — LIDOCAINE 5 % EX PTCH
1.0000 | MEDICATED_PATCH | Freq: Every day | CUTANEOUS | Status: DC
  Administered 2021-11-02 – 2021-11-12 (×11): 1 via TRANSDERMAL
  Filled 2021-11-02 (×11): qty 1

## 2021-11-02 NOTE — Sepsis Progress Note (Signed)
eLink monitoring sepsis protocol °

## 2021-11-02 NOTE — ED Notes (Signed)
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 Notified EDP

## 2021-11-02 NOTE — H&P (Signed)
History and Physical    Alyssa [name] ALP:[id number] DOB: 1941/10/02 DOA: 11/02/2021  PCP: Pcp, No (Confirm with patient/family/NH records and if not entered, this has to be entered at [facility] point of entry) Patient coming from: Home  I have personally briefly reviewed patient's old medical records in [place]  Chief Complaint: Feeling weak  HPI: Alyssa [name] is a 81 y.o. 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  Symptoms started yesterday progressively getting worse, minimum activity can trigger significant shortness of breath and wheezing.  Has episode of chills but no fever.  Patient was recently hospitalized for UTI, discharged home hospice.  Discussed with the son over the phone, reported family was contacted about home hospice, but still waiting for the hospice team's first visit.  ED Course: Patient was found to have tachycardia, tachypneic and hypoxia stable on 3 L.  Exam: wheezing, chest x-ray does not show significant infiltrates  Patient was started on ceftriaxone and azithromycin.  Review of Systems: Unable to perform, patient has significant increasing respiratory distress  Past Medical History:  Diagnosis Date   Anxiety    Arthritis    Chronic kidney disease    Headache(784.0)    Hyperlipidemia    Hypertension    Peripheral neuropathy    Peripheral vascular disease ([place])    Syncope 09/2020    Past Surgical History:  Procedure Laterality Date   ABDOMINAL ANGIOGRAM N/A 10/29/2011   Procedure: ABDOMINAL ANGIOGRAM;  Surgeon: Elam Dutch, MD;  Location: [facility] CATH LAB;  Service: Cardiovascular;  Laterality: N/A;   BACK SURGERY     COLONOSCOPY     ESOPHAGOGASTRODUODENOSCOPY     FEMORAL-FEMORAL BYPASS GRAFT  08/31/2010   FLEXIBLE SIGMOIDOSCOPY N/A  06/12/2019   Procedure: FLEXIBLE SIGMOIDOSCOPY;  Surgeon: Ladene Artist, MD;  Location: [place];  Service: Endoscopy;  Laterality: N/A;  Patient had little bit to eat this morning so this will be unsedated.   JOINT REPLACEMENT Right 10/18/2010   Hip   LOWER EXTREMITY ANGIOGRAPHY  06/14/2017   Procedure: Lower Extremity Angiography;  Surgeon: Adrian Prows, MD;  Location: [place] CV LAB;  Service: Cardiovascular;;  Bilateral limited angio performed   RENAL ANGIOGRAPHY N/A 06/14/2017   Procedure: Renal Angiography;  Surgeon: Adrian Prows, MD;  Location: [place] CV LAB;  Service: Cardiovascular;  Laterality: N/A;   TOTAL HIP ARTHROPLASTY     right     reports that she quit smoking about 6 months ago. Her smoking use included cigarettes. She has a 50.00 pack-year smoking history. She has never used smokeless tobacco. She reports that she does not drink alcohol and does not use drugs.  No Known Allergies  Family History  Problem Relation Age of Onset   Heart attack Mother    Heart disease Mother    Hyperlipidemia Mother    Hypertension Mother    Heart attack Father    Heart disease Father        Before age 62   Hyperlipidemia Father    Hypertension Father    Cancer Sister        brain tumor   Aneurysm Brother        brain   Colon cancer Neg Hx    Liver cancer Neg Hx     Prior to Admission medications  Medication Sig Start Date End Date Taking? Authorizing Provider  albuterol (VENTOLIN HFA) 108 (90 Base) MCG/ACT inhaler Inhale 2 puffs into the lungs every 4 (four) hours as needed for wheezing or shortness of breath.   Yes [provider]  amitriptyline (ELAVIL) 10 MG tablet Take 10 mg by mouth daily.   Yes [provider]  amLODipine (NORVASC) 10 MG tablet Take 1 tablet (10 mg total) by mouth daily. 09/18/21  Yes Little Ishikawa, MD  aspirin 81 MG EC tablet Take 1 tablet (81 mg total) by mouth daily. 01/09/19  Yes Gherghe, Vella Redhead, MD   Budeson-Glycopyrrol-Formoterol (BREZTRI AEROSPHERE) 160-9-4.8 MCG/ACT AERO Inhale 2 puffs into the lungs 2 (two) times daily. 07/23/21  Yes Tanda Rockers, MD  clopidogrel (PLAVIX) 75 MG tablet Take 1 tablet (75 mg total) by mouth daily. 01/09/19  Yes Caren Griffins, MD  diclofenac Sodium (VOLTAREN) 1 % GEL Apply 2 g topically every 6 (six) hours as needed (knee pain). 01/15/20  Yes [provider]  dicyclomine (BENTYL) 20 MG tablet Take 20 mg by mouth 3 (three) times daily. 04/01/20  Yes [provider]  Ensure Plus (ENSURE PLUS) LIQD Take 237 mLs by mouth daily.   Yes [provider]  Ferrous Sulfate (IRON HIGH-POTENCY) 325 MG TABS Take 325 mg by mouth daily with breakfast. Patient taking differently: Take 325 mg by mouth daily. 06/30/19  Yes Hongalgi, Lenis Dickinson, MD  hydrALAZINE (APRESOLINE) 50 MG tablet Take 1 tablet (50 mg total) by mouth every 8 (eight) hours. 10/09/21  Yes Patrecia Pour, MD  hydrOXYzine (ATARAX/VISTARIL) 25 MG tablet Take 25 mg by mouth at bedtime. 05/12/20  Yes [provider]  lidocaine (LIDODERM) 5 % Place 1 patch onto the skin daily. 08/31/21  Yes [provider]  LORazepam (ATIVAN) 0.5 MG tablet Take 1 tablet (0.5 mg total) by mouth every 8 (eight) hours as needed for anxiety or sleep. 10/24/21  Yes Ghimire, Henreitta Leber, MD  magnesium oxide (MAG-OX) 400 MG tablet Take 400 mg by mouth 2 (two) times daily.   Yes [provider]  Multiple Vitamins-Minerals (CERTAVITE/ANTIOXIDANTS) TABS Take 1 tablet by mouth at bedtime. 10/09/21  Yes Patrecia Pour, MD  nitroGLYCERIN (NITROSTAT) 0.4 MG SL tablet Place 1 tablet (0.4 mg total) under the tongue every 5 (five) minutes as needed for chest pain. 01/07/20  Yes Allie Bossier, MD  polyethylene glycol (MIRALAX) 17 g packet Take 17 g by mouth daily. 10/24/21 11/23/21 Yes Ghimire, Henreitta Leber, MD  pregabalin (LYRICA) 75 MG capsule Take 1 capsule (75 mg total) by mouth daily. 07/11/21 11/17/21 Yes  Antonieta Pert, MD  rosuvastatin (CRESTOR) 40 MG tablet Take 40 mg by mouth daily. 10/16/21  Yes [provider]  sodium bicarbonate 650 MG tablet Take 2 tablets (1,300 mg total) by mouth 2 (two) times daily. 01/07/20  Yes Allie Bossier, MD  sodium zirconium cyclosilicate (LOKELMA) 10 g PACK packet Take 10 g by mouth 3 (three) times a week. Do not take this within 2 hours of other medications 10/09/21  Yes Patrecia Pour, MD  valproic acid (DEPAKENE) 250 MG capsule Take 250 mg by mouth at bedtime.   Yes [provider]  cefdinir (OMNICEF) 300 MG capsule Take 1 capsule (300 mg total) by mouth daily. Patient not taking: Reported on 11/02/2021 10/24/21   Jonetta Osgood, MD  HYDROmorphone (DILAUDID) 2 MG tablet Take 0.5-1 tablets (1-2 mg total) by mouth every 4 (four) hours  as needed for severe pain or moderate pain. Patient not taking: Reported on 11/02/2021 10/24/21   Jonetta Osgood, MD    Physical Exam: Vitals:   11/02/21 1330 11/02/21 1340 11/02/21 1357 11/02/21 1400  BP: (!) 155/98 (!) 155/98  (!) 153/92  Pulse: (!) 104 (!) 104  (!) 104  Resp: 18 15  (!) 28  Temp:      TempSrc:      SpO2: 100% 100%  100%  Weight:   43.1 kg   Height:   5\' 3"  (1.6 m)     Constitutional: NAD, calm, comfortable Vitals:   11/02/21 1330 11/02/21 1340 11/02/21 1357 11/02/21 1400  BP: (!) 155/98 (!) 155/98  (!) 153/92  Pulse: (!) 104 (!) 104  (!) 104  Resp: 18 15  (!) 28  Temp:      TempSrc:      SpO2: 100% 100%  100%  Weight:   43.1 kg   Height:   5\' 3"  (1.6 m)    Eyes: PERRL, lids and conjunctivae normal ENMT: Mucous membranes are dry. Posterior pharynx clear of any exudate or lesions.Normal dentition.  Neck: normal, supple, no masses, no thyromegaly Respiratory: Diminished breathing sound bilaterally, diffused wheezing, no crackles.  Increasing respiratory effort. No accessory muscle use.  Cardiovascular: Regular rate and rhythm, no murmurs / rubs / gallops. No extremity edema. 2+  pedal pulses. No carotid bruits.  Abdomen: no tenderness, no masses palpated. No hepatosplenomegaly. Bowel sounds positive.  Musculoskeletal: no clubbing / cyanosis. No joint deformity upper and lower extremities. Good ROM, no contractures. Normal muscle tone.  Skin: no rashes, lesions, ulcers. No induration Neurologic: No facial droops, following simple commands Psychiatric: Normal judgment and insight. Alert and oriented x 3. Normal mood.     Labs on Admission: I have personally reviewed following labs and imaging studies  CBC: Recent Labs  Lab 11/02/21 1250 11/02/21 1524  WBC 8.3  --   NEUTROABS 6.4  --   HGB 12.7 8.8*  HCT 39.9 26.0*  MCV 84.4  --   PLT 352  --    Basic Metabolic Panel: Recent Labs  Lab 11/02/21 1250 11/02/21 1524  NA 142 143  K 5.8* 5.0  CL 111  --   CO2 16*  --   GLUCOSE 101*  --   BUN 37*  --   CREATININE 2.73*  --   CALCIUM 9.2  --    GFR: Estimated Creatinine Clearance: 11.6 mL/min (A) (by C-G formula based on SCr of 2.73 mg/dL (H)). Liver Function Tests: Recent Labs  Lab 11/02/21 1250  AST 39  ALT 16  ALKPHOS 79  BILITOT 0.8  PROT 6.8  ALBUMIN 3.1*   No results for input(s): LIPASE, AMYLASE in the last 168 hours. No results for input(s): AMMONIA in the last 168 hours. Coagulation Profile: Recent Labs  Lab 11/02/21 1250  INR 0.9   Cardiac Enzymes: No results for input(s): CKTOTAL, CKMB, CKMBINDEX, TROPONINI in the last 168 hours. BNP (last 3 results) No results for input(s): PROBNP in the last 8760 hours. HbA1C: No results for input(s): HGBA1C in the last 72 hours. CBG: No results for input(s): GLUCAP in the last 168 hours. Lipid Profile: No results for input(s): CHOL, HDL, LDLCALC, TRIG, CHOLHDL, LDLDIRECT in the last 72 hours. Thyroid Function Tests: No results for input(s): TSH, T4TOTAL, FREET4, T3FREE, THYROIDAB in the last 72 hours. Anemia Panel: No results for input(s): VITAMINB12, FOLATE, FERRITIN, TIBC, IRON,  RETICCTPCT in the last 72  hours. Urine analysis:    Component Value Date/Time   COLORURINE YELLOW 10/21/2021 1306   APPEARANCEUR CLOUDY (A) 10/21/2021 1306   LABSPEC 1.013 10/21/2021 1306   PHURINE 6.0 10/21/2021 1306   GLUCOSEU NEGATIVE 10/21/2021 1306   HGBUR NEGATIVE 10/21/2021 1306   BILIRUBINUR NEGATIVE 10/21/2021 1306   KETONESUR 5 (A) 10/21/2021 1306   PROTEINUR >=300 (A) 10/21/2021 1306   UROBILINOGEN 0.2 08/24/2015 1737   NITRITE NEGATIVE 10/21/2021 1306   LEUKOCYTESUR LARGE (A) 10/21/2021 1306    Radiological Exams on Admission: DG Chest Port 1 View  Result Date: 11/02/2021 CLINICAL DATA:  Questionable sepsis, shortness of breath. EXAM: PORTABLE CHEST 1 VIEW COMPARISON:  September 26, 2021 FINDINGS: The heart size and mediastinal contours are stable. Mild atelectasis of left lung base is identified. Both lungs are otherwise clear. The visualized skeletal structures are unremarkable. IMPRESSION: Mild atelectasis of left lung base. Electronically Signed   By: Abelardo Diesel M.D.   On: 11/02/2021 12:06    EKG: Independently reviewed.  Sinus tachycardia, LVH, no acute ST changes  Assessment/Plan Principal Problem:   PNA (pneumonia) Active Problems:   Community acquired pneumonia  (please populate well all problems here in Problem List. (For example, if patient is on BP meds at home and you resume or decide to hold them, it is a problem that needs to be her. Same for CAD, COPD, HLD and so on)  Acute COPD exacerbation -She had active wheezing/acute bronchitis -Escalate antibiotic steroid and ceftriaxone and azithromycin -Short course of p.o. steroid -Continue breathing treatment -Flutter valve and incentive spirometry -PT evaluation when breathing status improves. -Other cause of respiratory distress, CAP not ruling out but currently escalate coverage ceftriaxone/Romycin, send sputum culture and atypical pneumonia studies.  She has severe LVH and intermittent diastolic  dysfunction on last year's echo, clinically appears to be euvolemic to mild hypovolemic, will hold off further IV fluid.  Increase her BP meds.  Combined non-anion gap metabolic acidosis and respiratory alkalosis -May also contribute to her respiratory distress symptoms -Given her significant CKD, will not use Diamox at this point. -Increase her bicarb p.o. dosage to 3 times daily.  Hyperkalemia -Acute on chronic -No significant ST-T changes on EKG, continue daily Lokelma  CKD stage IV -As above.  Cachexia/severe protein calorie malnutrition -Son denied patient has history of choking with coughing or aspiration pneumonia. -Consult nutrition -Trial of Marinol    HTN -Continue Norvasc and hydralazine, start Coreg 3.125 twice daily  Right groin pseudoaneurysm -Not a candidate for surgical repair, plan for home hospice.  We will consult palliative care for symptom management.  PAD -Continue aspirin Plavix and statin  DVT prophylaxis: Heparin subcu Code Status: DNR Family Communication: Son over phone Disposition Plan: Expect more than 2 midnight hospital stay, patient is sick with multiple complicated medical problems. Consults called: None Admission status: Tele admit   Alyssa Halt MD [facility] Pager [phone]  11/02/2021, 3:58 PM

## 2021-11-02 NOTE — ED Notes (Signed)
RN f/u with micro about blood cultures. Technician stated the lab is behind but the cultures are in.

## 2021-11-02 NOTE — ED Triage Notes (Signed)
Pt here via [facility] from home for shob that started yesterday morning, worse w/ exertion. Pt has increased WOB, RA spO2 is 96%, but ems placed pt on 3L O2 via nasal cannula for comfort. 154/89, 28RR, cbg 130, hot to touch, ETCO2 20

## 2021-11-02 NOTE — Care Management (Signed)
Patient is from home, and is active with [facility] Esther Hardy liaison [phone])

## 2021-11-02 NOTE — ED Notes (Addendum)
Pt is a hard stick. Two RN's attempted IV access twice apiece. First set of cultures have been drawn. Notified IV team, phlebotomy, and EDP

## 2021-11-02 NOTE — ED Provider Notes (Signed)
[facility] EMERGENCY DEPARTMENT Provider Note   CSN: [id number] Arrival date & time: 11/02/21  1109     History  Chief Complaint  Patient presents with   Shortness of Breath    Alyssa [name] is a 81 y.o. female.  Pt is a 81 yo bf with a hx of hyperlipidemia, htn, pvd, ckd, COPD, dementia, and a pseudoaneurysm right femoral artery.  Pt was admitted from 1/4-1/7 for pyelonephritis.  She was d/c on omnicef.  She was not a surgical candidate for pseudoaneurysm repair.  She is not a HD candidate.  Palliative care was consulted and she was supposed to go home with hospice.  Per EMS, she has been sob since yesterday.  She has had a fever.  She said she is freezing.  She feels very sob.  She is a poor historian.             Home Medications Prior to Admission medications   Medication Sig Start Date End Date Taking? Authorizing Provider  albuterol (VENTOLIN HFA) 108 (90 Base) MCG/ACT inhaler Inhale 2 puffs into the lungs every 4 (four) hours as needed for wheezing or shortness of breath.   Yes [provider]  amitriptyline (ELAVIL) 10 MG tablet Take 10 mg by mouth daily.   Yes [provider]  amLODipine (NORVASC) 10 MG tablet Take 1 tablet (10 mg total) by mouth daily. 09/18/21  Yes Little Ishikawa, MD  aspirin 81 MG EC tablet Take 1 tablet (81 mg total) by mouth daily. 01/09/19  Yes Gherghe, Vella Redhead, MD  Budeson-Glycopyrrol-Formoterol (BREZTRI AEROSPHERE) 160-9-4.8 MCG/ACT AERO Inhale 2 puffs into the lungs 2 (two) times daily. 07/23/21  Yes Tanda Rockers, MD  clopidogrel (PLAVIX) 75 MG tablet Take 1 tablet (75 mg total) by mouth daily. 01/09/19  Yes Caren Griffins, MD  diclofenac Sodium (VOLTAREN) 1 % GEL Apply 2 g topically every 6 (six) hours as needed (knee pain). 01/15/20  Yes [provider]  dicyclomine (BENTYL) 20 MG tablet Take 20 mg by mouth 3 (three) times daily. 04/01/20  Yes [provider]  Ensure Plus  (ENSURE PLUS) LIQD Take 237 mLs by mouth daily.   Yes [provider]  Ferrous Sulfate (IRON HIGH-POTENCY) 325 MG TABS Take 325 mg by mouth daily with breakfast. Patient taking differently: Take 325 mg by mouth daily. 06/30/19  Yes Hongalgi, Lenis Dickinson, MD  hydrALAZINE (APRESOLINE) 50 MG tablet Take 1 tablet (50 mg total) by mouth every 8 (eight) hours. 10/09/21  Yes Patrecia Pour, MD  hydrOXYzine (ATARAX/VISTARIL) 25 MG tablet Take 25 mg by mouth at bedtime. 05/12/20  Yes [provider]  lidocaine (LIDODERM) 5 % Place 1 patch onto the skin daily. 08/31/21  Yes [provider]  LORazepam (ATIVAN) 0.5 MG tablet Take 1 tablet (0.5 mg total) by mouth every 8 (eight) hours as needed for anxiety or sleep. 10/24/21  Yes Ghimire, Henreitta Leber, MD  magnesium oxide (MAG-OX) 400 MG tablet Take 400 mg by mouth 2 (two) times daily.   Yes [provider]  Multiple Vitamins-Minerals (CERTAVITE/ANTIOXIDANTS) TABS Take 1 tablet by mouth at bedtime. 10/09/21  Yes Patrecia Pour, MD  nitroGLYCERIN (NITROSTAT) 0.4 MG SL tablet Place 1 tablet (0.4 mg total) under the tongue every 5 (five) minutes as needed for chest pain. 01/07/20  Yes Allie Bossier, MD  polyethylene glycol (MIRALAX) 17 g packet Take 17 g by mouth daily. 10/24/21 11/23/21 Yes Ghimire, Henreitta Leber, MD  pregabalin (LYRICA) 75 MG capsule Take 1 capsule (75 mg total) by mouth daily. 07/11/21 11/17/21 Yes Antonieta Pert, MD  rosuvastatin (CRESTOR) 40 MG tablet Take 40 mg by mouth daily. 10/16/21  Yes [provider]  sodium bicarbonate 650 MG tablet Take 2 tablets (1,300 mg total) by mouth 2 (two) times daily. 01/07/20  Yes Allie Bossier, MD  sodium zirconium cyclosilicate (LOKELMA) 10 g PACK packet Take 10 g by mouth 3 (three) times a week. Do not take this within 2 hours of other medications 10/09/21  Yes Patrecia Pour, MD  valproic acid (DEPAKENE) 250 MG capsule Take 250 mg by mouth at bedtime.   Yes [provider]   cefdinir (OMNICEF) 300 MG capsule Take 1 capsule (300 mg total) by mouth daily. Patient not taking: Reported on 11/02/2021 10/24/21   Alyssa Osgood, MD  HYDROmorphone (DILAUDID) 2 MG tablet Take 0.5-1 tablets (1-2 mg total) by mouth every 4 (four) hours as needed for severe pain or moderate pain. Patient not taking: Reported on 11/02/2021 10/24/21   Alyssa Osgood, MD      Allergies    Patient has no known allergies.    Review of Systems   Review of Systems  Constitutional:  Positive for fever.  Respiratory:  Positive for shortness of breath.   All other systems reviewed and are negative.  Physical Exam Updated Vital Signs BP (!) 153/92    Pulse (!) 104    Temp 98.3 F (36.8 C) (Rectal)    Resp (!) 28    Ht 5\' 3"  (1.6 m)    Wt 43.1 kg    SpO2 100%    BMI 16.83 kg/m  Physical Exam Vitals and nursing note reviewed.  Constitutional:      General: She is in acute distress.     Appearance: She is cachectic.  HENT:     Head: Normocephalic and atraumatic.     Mouth/Throat:     Mouth: Mucous membranes are moist.     Pharynx: Oropharynx is clear.  Eyes:     Extraocular Movements: Extraocular movements intact.     Pupils: Pupils are equal, round, and reactive to light.  Cardiovascular:     Rate and Rhythm: Regular rhythm. Tachycardia present.  Pulmonary:     Effort: Tachypnea present.     Breath sounds: Rhonchi present.  Abdominal:     General: Bowel sounds are normal.     Palpations: Abdomen is soft.  Musculoskeletal:        General: Normal range of motion.     Cervical back: Normal range of motion and neck supple.  Skin:    General: Skin is warm.     Capillary Refill: Capillary refill takes less than 2 seconds.  Neurological:     General: No focal deficit present.     Mental Status: Mental status is at baseline.  Psychiatric:        Mood and Affect: Mood normal.        Behavior: Behavior normal.    ED Results / Procedures / Treatments   Labs (all labs ordered are  listed, but only abnormal results are displayed) Labs Reviewed  COMPREHENSIVE METABOLIC PANEL - Abnormal; Notable for the following components:      Result Value   Potassium 5.8 (*)    CO2 16 (*)    Glucose, Bld 101 (*)    BUN 37 (*)    Creatinine, Ser 2.73 (*)    Albumin 3.1 (*)  GFR, Estimated 17 (*)    All other components within normal limits  CBC WITH DIFFERENTIAL/PLATELET - Abnormal; Notable for the following components:   RDW 18.0 (*)    All other components within normal limits  RESP PANEL BY RT-PCR (FLU A&B, COVID) ARPGX2  CULTURE, BLOOD (ROUTINE X 2)  CULTURE, BLOOD (ROUTINE X 2)  URINE CULTURE  EXPECTORATED SPUTUM ASSESSMENT W GRAM STAIN, RFLX TO RESP C  MRSA NEXT GEN BY PCR, NASAL  LACTIC ACID, PLASMA  PROTIME-INR  APTT  LACTIC ACID, PLASMA  URINALYSIS, ROUTINE W REFLEX MICROSCOPIC  LEGIONELLA PNEUMOPHILA SEROGP 1 UR AG  STREP PNEUMONIAE URINARY ANTIGEN  BLOOD GAS, VENOUS    EKG EKG Interpretation  Date/Time:  Monday November 02 2021 11:21:24 EST Ventricular Rate:  103 PR Interval:  156 QRS Duration: 81 QT Interval:  349 QTC Calculation: 457 R Axis:   41 Text Interpretation: Sinus tachycardia LVH with secondary repolarization abnormality Since last tracing rate faster Confirmed by Isla Pence [phone]) on 11/02/2021 1:51:27 PM  Radiology DG Chest Port 1 View  Result Date: 11/02/2021 CLINICAL DATA:  Questionable sepsis, shortness of breath. EXAM: PORTABLE CHEST 1 VIEW COMPARISON:  September 26, 2021 FINDINGS: The heart size and mediastinal contours are stable. Mild atelectasis of left lung base is identified. Both lungs are otherwise clear. The visualized skeletal structures are unremarkable. IMPRESSION: Mild atelectasis of left lung base. Electronically Signed   By: Abelardo Diesel M.D.   On: 11/02/2021 12:06    Procedures Procedures    Medications Ordered in ED Medications  lactated ringers infusion (has no administration in time range)  lactated  ringers bolus 1,000 mL (1,000 mLs Intravenous New Bag/Given 11/02/21 1355)    And  lactated ringers bolus 500 mL (has no administration in time range)  cefTRIAXone (ROCEPHIN) 2 g in sodium chloride 0.9 % 100 mL IVPB (0 g Intravenous Stopped 11/02/21 1359)  azithromycin (ZITHROMAX) 500 mg in sodium chloride 0.9 % 250 mL IVPB (500 mg Intravenous New Bag/Given 11/02/21 1355)  methylPREDNISolone sodium succinate (SOLU-MEDROL) 125 mg/2 mL injection 125 mg (has no administration in time range)  aspirin EC tablet 81 mg (has no administration in time range)  amLODipine (NORVASC) tablet 10 mg (has no administration in time range)  hydrALAZINE (APRESOLINE) tablet 50 mg (has no administration in time range)  carvedilol (COREG) tablet 3.125 mg (has no administration in time range)  rosuvastatin (CRESTOR) tablet 40 mg (has no administration in time range)  amitriptyline (ELAVIL) tablet 10 mg (has no administration in time range)  hydrOXYzine (ATARAX) tablet 25 mg (has no administration in time range)  LORazepam (ATIVAN) tablet 0.5 mg (has no administration in time range)  dicyclomine (BENTYL) tablet 20 mg (has no administration in time range)  magnesium oxide (MAG-OX) tablet 400 mg (has no administration in time range)  polyethylene glycol (MIRALAX / GLYCOLAX) packet 17 g (has no administration in time range)  sodium bicarbonate tablet 1,300 mg (has no administration in time range)  clopidogrel (PLAVIX) tablet 75 mg (has no administration in time range)  Iron High-Potency TABS 325 mg (has no administration in time range)  sodium zirconium cyclosilicate (LOKELMA) packet 10 g (has no administration in time range)  pregabalin (LYRICA) capsule 75 mg (has no administration in time range)  valproic acid (DEPAKENE) 250 MG capsule 250 mg (has no administration in time range)  feeding supplement (ENSURE ENLIVE / ENSURE PLUS) liquid 237 mL (has no administration in time range)  CertaVite/Antioxidants TABS 1 tablet  (has no  administration in time range)  albuterol (VENTOLIN HFA) 108 (90 Base) MCG/ACT inhaler 2 puff (has no administration in time range)  Budeson-Glycopyrrol-Formoterol 160-9-4.8 MCG/ACT AERO 2 puff (has no administration in time range)  diclofenac Sodium (VOLTAREN) 1 % topical gel 2 g (has no administration in time range)  lidocaine (LIDODERM) 5 % 1 patch (has no administration in time range)  heparin injection 5,000 Units (has no administration in time range)  dronabinol (MARINOL) capsule 2.5 mg (has no administration in time range)  cefTRIAXone (ROCEPHIN) 2 g in sodium chloride 0.9 % 100 mL IVPB (has no administration in time range)  azithromycin (ZITHROMAX) 500 mg in sodium chloride 0.9 % 250 mL IVPB (has no administration in time range)  predniSONE (DELTASONE) tablet 40 mg (has no administration in time range)  ipratropium (ATROVENT) nebulizer solution 0.5 mg (has no administration in time range)  acetaminophen (TYLENOL) tablet 650 mg (has no administration in time range)    Or  acetaminophen (TYLENOL) suppository 650 mg (has no administration in time range)  ondansetron (ZOFRAN) tablet 4 mg (has no administration in time range)    Or  ondansetron (ZOFRAN) injection 4 mg (has no administration in time range)  oxyCODONE (Oxy IR/ROXICODONE) immediate release tablet 5 mg (has no administration in time range)  ipratropium-albuterol (DUONEB) 0.5-2.5 (3) MG/3ML nebulizer solution 3 mL (3 mLs Nebulization Given 11/02/21 1420)    ED Course/ Medical Decision Making/ A&P                           Medical Decision Making  Pt had a fever at home, but no fever here.  She is tachycardic and RR is elevated, so a code sepsis was called.    Pt is given IVFs and rocephin/zithromax for a possible pneumonia.  Pt's CXR is clear, but she is dehydrated.  Her son said she won't eat anything.    Pt given IVFs and a neb.  She is breathing a little better, but is still sob.  Pt d/w Dr. Roosevelt Locks ([facility]) who  will admit.  Plan reviewed with pt's son via phone.         Final Clinical Impression(s) / ED Diagnoses Final diagnoses:  Failure to thrive in adult  COPD exacerbation (HCC)  Bronchitis  CKD (chronic kidney disease) stage 4, GFR 15-29 ml/min [facility])    Rx / DC Orders ED Discharge Orders     None         Isla Pence, MD 11/02/21 1506

## 2021-11-02 NOTE — Consult Note (Signed)
Consultation Note Date: 11/02/2021   [name]  DOB: 05-09-1941  MRN: [id number]  Age / Sex: 81 y.o., female  PCP: Pcp, No Referring Physician: Lequita Halt, MD  Reason for Consultation: Establishing goals of care  HPI/Patient Profile: 81 y.o. 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 Patient was admitted on 11/02/2021 with acute COPD exacerbation, combined non-anion gap, hyperkalemia, cachexia/severe protein calorie malnutrition, right groin pseudoaneurysm.   Of note, patient has been seen multiple times by PMT over several hospitalizations. She was most recently hospitalized from 1/4-10/24/21 with pyelonephritis. Patient was deemed not a surgical candidate for right groin pseudoaneurysm and was not a dialysis candidate. Patient was discharged home with hospice.   ED Course: Patient was found to have tachycardia, tachypneic and hypoxia stable on 3 L. Exam: wheezing, chest x-ray does not show significant infiltrates. Patient was started on ceftriaxone and azithromycin.  Clinical Assessment and Goals of Care: I have reviewed medical records including EPIC notes, labs, and imaging. Noted patient is already enrolled with [facility] home hospice services. Received report from primary RN - no acute concerns.    Called [facility] liaison Freddie Breech - notified him patient is in the [facility] ED and confirmed patient is enrolled in their hospice care. Patient was enrolled 10/21/21 and is scheduled to have 2 RN visits per week along with CNA visits MWF.   Went to visit patient at bedside - no family/visitors present. Patient was lying in bed awake, alert, oriented, and able to participate in conversation.  She is frail and weak appearing. Signs and non-verbal gestures of pain and discomfort noted. Patient reports chronic back/left hip pain - the dilaudid she takes at home manages her pain well. She reports her current pain 9/10 with associated nausea. No respiratory distress or increased work of breathing; congested cough noted. She is on acute 3L O2 [place].  Met with patient  to discuss diagnosis, prognosis, GOC, EOL wishes, disposition, and options.  I re-introduced Palliative Medicine as specialized medical care for people living with serious illness. It focuses on providing relief from the symptoms and stress of a serious illness. The goal is to improve quality of life for both the patient and the family.  We discussed interval history since her last admission. Patient was living in a private residence with her grandson, noting her son also checked on her. Patient states that hospice staff have been visiting with her and reports things were going well for her at home until about 1 day ago, when she began having increasing shortness of breath that began limiting her activities. Patient tells me that her son called hospice prior to bring her to the hospital; however, she does not know what hospice told her son.   We discussed patient's current illness and what it means  larger context of patient's on-going co-morbidities. Natural disease trajectory and expectations at EOL were discussed. I attempted to elicit values and goals of care important to the patient. The difference between aggressive medical intervention and comfort care was considered in light of the patient's goals of care. Patient's goal is to treat the treatable and hopefully feel less short of breath, so she can return home.  Patient is agreeable to continue hospice services on discharge.  Advance directives were considered and discussed - she does not have a Living Will or HCPOA but does state she would want her son/[name] as her  surrogate decision maker.   Discussed with patient the importance of continued conversation with each other and the medical providers regarding overall plan of care and treatment options, ensuring decisions are within the context of the patients values and GOCs.    Questions and concerns were addressed. The patient/family was encouraged to call with questions and/or concerns. PMT card was provided.  Requested RN provided antiemetic and pain medication once ordered.   Primary Decision Maker PATIENT Per patient, surrogate decision maker if needed would be her son/[name] Godek.    SUMMARY OF RECOMMENDATIONS   Treat the treatable - patient's goal is to feel less short of breath so she can, hopefully, return home with hospice Continue DNR/DNI as previously documented Patient is already enrolled with [facility] home hosp[facility] - notified them of patient's admission TOC consulted for: notify [facility] of discharge plans - they will schedule a follow up visit for shortly after her discharge [facility] liaison Esther Hardy [phone] Started dilaudid 1-77m PO q4h PRN pain/dyspnea/distress/RR >25, which is home regimen  Continue lidocaine patch PMT will continue to follow and support holistically   Code Status/Advance Care Planning: DNR  Palliative Prophylaxis:  Aspiration, Bowel Regimen, Delirium Protocol, Frequent Pain Assessment, Oral Care, and Turn Reposition  Additional Recommendations (Limitations, Scope, Preferences): Full Scope Treatment, No Artificial Feeding, and No Tracheostomy  Psycho-social/Spiritual:  Desire for further Chaplaincy support:no Created space and opportunity for patient to express thoughts and feelings regarding patient's current medical situation.  Emotional support and therapeutic listening provided.  Prognosis:  < 6 months  Discharge Planning: Home with Hospice      Primary Diagnoses: Present on Admission:  PNA (pneumonia)   I have reviewed the  medical record, interviewed the patient and family, and examined the patient. The following aspects are pertinent.  Past Medical History:  Diagnosis Date   Anxiety    Arthritis    Chronic kidney disease    Headache(784.0)    Hyperlipidemia    Hypertension    Peripheral neuropathy    Peripheral vascular disease ([place]    Syncope 09/2020   Social History   Socioeconomic History   Marital status: Single    Spouse name: Not on file   Number of children: 2   Years of education: 11th   Highest education level: Not on file  Occupational History   Occupation: Retired  Tobacco Use   Smoking status: Former    Packs/day: 1.00    Years: 50.00    Pack years: 50.00    Types: Cigarettes    Quit date: 04/22/2021    Years since quitting: 0.5   Smokeless tobacco: Never   Tobacco comments:    Quit 3 months ago 07/23/21 [facility]  Vaping Use   Vaping Use: Never used  Substance and Sexual Activity   Alcohol use: No   Drug use: No   Sexual activity: Not Currently  Other  Topics Concern   Not on file  Social History Narrative    Lives at home with her brother.   Right-handed.   Occasional caffeine use.   Social Determinants of Health   Financial Resource Strain: Not on file  Food Insecurity: Not on file  Transportation Needs: Not on file  Physical Activity: Not on file  Stress: Not on file  Social Connections: Not on file   Family History  Problem Relation Age of Onset   Heart attack Mother    Heart disease Mother    Hyperlipidemia Mother    Hypertension Mother    Heart attack Father    Heart disease Father        Before age 61   Hyperlipidemia Father    Hypertension Father    Cancer Sister        brain tumor   Aneurysm Brother        brain   Colon cancer Neg Hx    Liver cancer Neg Hx    Scheduled Meds:  amitriptyline  10 mg Oral Daily   amLODipine  10 mg Oral Daily   aspirin EC  81 mg Oral Daily   Budeson-Glycopyrrol-Formoterol  2 puff Inhalation BID   carvedilol  3.125  mg Oral BID WC   clopidogrel  75 mg Oral Daily   dicyclomine  20 mg Oral TID   dronabinol  2.5 mg Oral BID AC   feeding supplement  237 mL Oral BID BM   [START ON 11/03/2021] ferrous sulfate  325 mg Oral Q breakfast   heparin  5,000 Units Subcutaneous Q12H   hydrALAZINE  50 mg Oral Q8H   hydrOXYzine  25 mg Oral QHS   ipratropium  0.5 mg Nebulization Q6H   lidocaine  1 patch Transdermal Daily   magnesium oxide  400 mg Oral BID   multivitamin with minerals  1 tablet Oral QHS   polyethylene glycol  17 g Oral Daily   [START ON 11/03/2021] predniSONE  40 mg Oral Q breakfast   pregabalin  75 mg Oral Daily   rosuvastatin  40 mg Oral Daily   sodium bicarbonate  1,300 mg Oral TID   sodium zirconium cyclosilicate  10 g Oral Daily   valproic acid  250 mg Oral QHS   Continuous Infusions:  azithromycin Stopped (11/02/21 1508)   cefTRIAXone (ROCEPHIN)  IV Stopped (11/02/21 1359)   lactated ringers 150 mL/hr at 11/02/21 1528   PRN Meds:.acetaminophen **OR** acetaminophen, albuterol, diclofenac Sodium, LORazepam, ondansetron **OR** ondansetron (ZOFRAN) IV Medications Prior to Admission:  Prior to Admission medications   Medication Sig Start Date End Date Taking? Authorizing Provider  albuterol (VENTOLIN HFA) 108 (90 Base) MCG/ACT inhaler Inhale 2 puffs into the lungs every 4 (four) hours as needed for wheezing or shortness of breath.   Yes [provider]  amitriptyline (ELAVIL) 10 MG tablet Take 10 mg by mouth daily.   Yes [provider]  amLODipine (NORVASC) 10 MG tablet Take 1 tablet (10 mg total) by mouth daily. 09/18/21  Yes Little Ishikawa, MD  aspirin 81 MG EC tablet Take 1 tablet (81 mg total) by mouth daily. 01/09/19  Yes Gherghe, Vella Redhead, MD  Budeson-Glycopyrrol-Formoterol (BREZTRI AEROSPHERE) 160-9-4.8 MCG/ACT AERO Inhale 2 puffs into the lungs 2 (two) times daily. 07/23/21  Yes Tanda Rockers, MD  clopidogrel (PLAVIX) 75 MG tablet Take 1 tablet (75 mg total) by  mouth daily. 01/09/19  Yes Caren Griffins, MD  diclofenac Sodium (VOLTAREN) 1 %  1 % GEL Apply 2 g topically every 6 (six) hours as needed (knee pain). 01/15/20  Yes [provider]  dicyclomine (BENTYL) 20 MG tablet Take 20 mg by mouth 3 (three) times daily. 04/01/20  Yes [provider]  Ensure Plus (ENSURE PLUS) LIQD Take 237 mLs by mouth daily.   Yes [provider]  Ferrous Sulfate (IRON HIGH-POTENCY) 325 MG TABS Take 325 mg by mouth daily with breakfast. Patient taking differently: Take 325 mg by mouth daily. 06/30/19  Yes Hongalgi, Lenis Dickinson, MD  hydrALAZINE (APRESOLINE) 50 MG tablet Take 1 tablet (50 mg total) by mouth every 8 (eight) hours. 10/09/21  Yes Patrecia Pour, MD  hydrOXYzine (ATARAX/VISTARIL) 25 MG tablet Take 25 mg by mouth at bedtime. 05/12/20  Yes [provider]  lidocaine (LIDODERM) 5 % Place 1 patch onto the skin daily. 08/31/21  Yes [provider]  LORazepam (ATIVAN) 0.5 MG tablet Take 1 tablet (0.5 mg total) by mouth every 8 (eight) hours as needed for anxiety or sleep. 10/24/21  Yes Ghimire, Henreitta Leber, MD  magnesium oxide (MAG-OX) 400 MG tablet Take 400 mg by mouth 2 (two) times daily.   Yes [provider]  Multiple Vitamins-Minerals (CERTAVITE/ANTIOXIDANTS) TABS Take 1 tablet by mouth at bedtime. 10/09/21  Yes Patrecia Pour, MD  nitroGLYCERIN (NITROSTAT) 0.4 MG SL tablet Place 1 tablet (0.4 mg total) under the tongue every 5 (five) minutes as needed for chest pain. 01/07/20  Yes Allie Bossier, MD  polyethylene glycol (MIRALAX) 17 g packet Take 17 g by mouth daily. 10/24/21 11/23/21 Yes Ghimire, Henreitta Leber, MD  pregabalin (LYRICA) 75 MG capsule Take 1 capsule (75 mg total) by mouth daily. 07/11/21 11/17/21 Yes Antonieta Pert, MD  rosuvastatin (CRESTOR) 40 MG tablet Take 40 mg by mouth daily. 10/16/21  Yes [provider]  sodium bicarbonate 650 MG tablet Take 2 tablets (1,300 mg total) by mouth 2 (two) times daily. 01/07/20  Yes  Allie Bossier, MD  sodium zirconium cyclosilicate (LOKELMA) 10 g PACK packet Take 10 g by mouth 3 (three) times a week. Do not take this within 2 hours of other medications 10/09/21  Yes Patrecia Pour, MD  valproic acid (DEPAKENE) 250 MG capsule Take 250 mg by mouth at bedtime.   Yes [provider]  cefdinir (OMNICEF) 300 MG capsule Take 1 capsule (300 mg total) by mouth daily. Patient not taking: Reported on 11/02/2021 10/24/21   Jonetta Osgood, MD  HYDROmorphone (DILAUDID) 2 MG tablet Take 0.5-1 tablets (1-2 mg total) by mouth every 4 (four) hours as needed for severe pain or moderate pain. Patient not taking: Reported on 11/02/2021 10/24/21   Jonetta Osgood, MD   No Known Allergies Review of Systems  Constitutional:  Positive for activity change, appetite change and fatigue.  HENT:  Negative for trouble swallowing.   Gastrointestinal:  Positive for nausea. Negative for vomiting.  Musculoskeletal:  Positive for back pain.  Neurological:  Positive for weakness.  All other systems reviewed and are negative.  Physical Exam Vitals and nursing note reviewed.  Constitutional:      General: She is not in acute distress.    Appearance: She is cachectic. She is ill-appearing.  Pulmonary:     Effort: No respiratory distress.  Skin:    General: Skin is warm and dry.  Neurological:     Mental Status: She is alert and oriented to person, place, and time.     Motor: Weakness  Psychiatric:        Attention and Perception: Attention normal.        Behavior: Behavior is cooperative.        Cognition and Memory: Cognition and memory normal.    Vital Signs: BP (!) 144/82    Pulse 99    Temp 98.3 F (36.8 C) (Rectal)    Resp (!) 23    Ht _0  (1.6 m)    Wt 43.1 kg    SpO2 99%    BMI 16.83 kg/m  Pain Scale: 0-10   Pain Score: Asleep   SpO2: SpO2: 99 % O2 Device:SpO2: 99 % O2 Flow Rate: .O2 Flow Rate (L/min): 3 L/min  IO: Intake/output summary:  Intake/Output  Summary (Last 24 hours) at 11/02/2021 1711 Last data filed at 11/02/2021 1600 Gross per 24 hour  Intake 500 ml  Output --  Net 500 ml    LBM:   Baseline Weight: Weight: 43.1 kg Most recent weight: Weight: 43.1 kg     Palliative Assessment/Data: PPS 20-30%     Time In: 1715 Time Out: 1827 Time Total: 72 minutes  Greater than 50%  of this time was spent counseling and coordinating care related to the above assessment and plan.  Signed by: Lin Landsman, NP   Please contact Palliative Medicine Team phone at [phone] for questions and concerns.  For individual provider: See Shea Evans

## 2021-11-03 LAB — CBC
HCT: 26.7 % — ABNORMAL LOW (ref 36.0–46.0)
Hemoglobin: 8.9 g/dL — ABNORMAL LOW (ref 12.0–15.0)
MCH: 27.6 pg (ref 26.0–34.0)
MCHC: 33.3 g/dL (ref 30.0–36.0)
MCV: 82.9 fL (ref 80.0–100.0)
Platelets: 409 10*3/uL — ABNORMAL HIGH (ref 150–400)
RBC: 3.22 MIL/uL — ABNORMAL LOW (ref 3.87–5.11)
RDW: 18 % — ABNORMAL HIGH (ref 11.5–15.5)
WBC: 8.6 10*3/uL (ref 4.0–10.5)
nRBC: 0 % (ref 0.0–0.2)

## 2021-11-03 LAB — MRSA NEXT GEN BY PCR, NASAL: MRSA by PCR Next Gen: NOT DETECTED

## 2021-11-03 MED ORDER — METHYLPREDNISOLONE SODIUM SUCC 125 MG IJ SOLR
80.0000 mg | Freq: Two times a day (BID) | INTRAMUSCULAR | Status: DC
  Administered 2021-11-03 – 2021-11-04 (×3): 80 mg via INTRAVENOUS
  Filled 2021-11-03 (×3): qty 2

## 2021-11-03 NOTE — Progress Notes (Addendum)
PROGRESS NOTE    Alyssa [name]  IWL:[id number] DOB: 1941-04-05 DOA: 11/02/2021 PCP: Pcp, No   Chief Complaint  Patient presents with   Shortness of Breath    Brief Narrative:    Alyssa [name] is a 81 y.o. 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  Patient with multiple recent hospitalizations, actually this is her fourth hospitalization since timbre of 2022 -Does report dyspnea, her work-up was significant for COPD exacerbation, she was seen by palliative medicine.   Assessment & Plan:   Principal Problem:   PNA (pneumonia) Active Problems:   Community acquired pneumonia   COPD exacerbation ([place])  Acute COPD exacerbation/dyspnea -She had active wheezing/acute bronchitis on presentation, she does report significant dyspnea, she had wheezing, received IV Solu-Medrol initially with some improvement, as well she is on antibiotic IV Rocephin and azithromycin -She reports dyspnea has improved today, but she still have wheezing, continue with IV steroids hoping she can be transitioned to oral taper in 1 to 2 days -She is having continued complaints of dyspnea, this is most likely due to her COPD, certainly progressive especially she is still heavy smoker smokes up to half pack per day, palliative input greatly appreciated, continue with p.o. Dilaudid to relieve her dyspnea and air hunger feeling, as it seems to be much more significant than her actual findings on exam and imaging. -Hospice has been arranged during most recent hospital discharge 10/24/2021, but apparently they have not seen the patient yet at home, [facility] consult has been placed, as we need to establish care with patient shortly after discharge, as she is very high risk for admission and she will need significant symptoms  management when she gets home.   Combined non-anion gap metabolic acidosis and respiratory alkalosis -May also contribute to her respiratory distress symptoms -Given her significant CKD, will not use Diamox at this point. -Increase her bicarb p.o. dosage to 3 times daily.   Hyperkalemia -Acute on chronic -No significant ST-T changes on EKG, continue daily Lokelma   CKD stage IV -As above.   Cachexia/severe protein calorie malnutrition -Son denied patient has history of choking with coughing or aspiration pneumonia. -Consult nutrition -Trial of Marinol   Anemia chronic disease -Hemoglobin around baseline 8-9(hemoglobin of 12 on admission appears to be due to error as it is only elevated value in few month)   HTN -Continue Norvasc and hydralazine, start Coreg 3.125 twice daily   Right groin pseudoaneurysm -Not a candidate for surgical repair, plan for home hospice.  We will consult palliative care for symptom management.   PAD -Continue aspirin Plavix and statin    DVT prophylaxis: Heparin Code Status: DNR Family Communication: None at bedside Disposition:   Status is: Inpatient  Remains inpatient appropriate because: Still with dyspnea, wheezing on IV steroids       Consultants:  Palliative medicine Subjective:  Patient reports weakness, fatigue, dyspnea  Objective: Vitals:   11/03/21 1300 11/03/21 1355 11/03/21 1400 11/03/21 1424  BP: 130/81 133/78 120/76   Pulse: 85  86   Resp: 14  17   Temp:    97.6 F (36.4 C)  TempSrc:    Oral  SpO2: 95%  96%   Weight:      Height:        Intake/Output Summary (Last 24 hours) at 11/03/2021  [place] filed at 11/03/2021 1221 Gross per 24 hour  Intake 1902.31 ml  Output --  Net 1902.31 ml   Filed Weights   11/02/21 1357  Weight: 43.1 kg    Examination:  Awake Alert, Oriented X 3, Trembly frail, deconditioned and chronically ill-appearing Symmetrical Chest wall movement, Minister entry bilaterally,  with scattered wheezing RRR,No Gallops,Rubs or new Murmurs, No Parasternal Heave +ve B.Sounds, Abd Soft, No tenderness, No rebound - guarding or rigidity. No Cyanosis, Clubbing or edema, No new Rash or bruise      Data Reviewed: I have personally reviewed following labs and imaging studies  CBC: Recent Labs  Lab 11/02/21 1250 11/02/21 1524 11/03/21 0431  WBC 8.3  --  8.6  NEUTROABS 6.4  --   --   HGB 12.7 8.8* 8.9*  HCT 39.9 26.0* 26.7*  MCV 84.4  --  82.9  PLT 352  --  409*    Basic Metabolic Panel: Recent Labs  Lab 11/02/21 1250 11/02/21 1524  NA 142 143  K 5.8* 5.0  CL 111  --   CO2 16*  --   GLUCOSE 101*  --   BUN 37*  --   CREATININE 2.73*  --   CALCIUM 9.2  --     GFR: Estimated Creatinine Clearance: 11.6 mL/min (A) (by C-G formula based on SCr of 2.73 mg/dL (H)).  Liver Function Tests: Recent Labs  Lab 11/02/21 1250  AST 39  ALT 16  ALKPHOS 79  BILITOT 0.8  PROT 6.8  ALBUMIN 3.1*    CBG: No results for input(s): GLUCAP in the last 168 hours.   Recent Results (from the past 240 hour(s))  Resp Panel by RT-PCR (Flu A&B, Covid) Nasopharyngeal Swab     Status: None   Collection Time: 11/02/21 11:27 AM   Specimen: Nasopharyngeal Swab; Nasopharyngeal(NP) swabs in vial transport medium  Result Value Ref Range Status   SARS Coronavirus 2 by RT PCR NEGATIVE NEGATIVE Final    Comment: (NOTE) SARS-CoV-2 target nucleic acids are NOT DETECTED.  The SARS-CoV-2 RNA is generally detectable in upper respiratory specimens during the acute phase of infection. The lowest concentration of SARS-CoV-2 viral copies this assay can detect is 138 copies/mL. A negative result does not preclude SARS-Cov-2 infection and should not be used as the sole basis for treatment or other patient management decisions. A negative result may occur with  improper specimen collection/handling, submission of specimen other than nasopharyngeal swab, presence of viral mutation(s)  within the areas targeted by this assay, and inadequate number of viral copies(<138 copies/mL). A negative result must be combined with clinical observations, patient history, and epidemiological information. The expected result is Negative.  Fact Sheet for Patients:  EntrepreneurPulse.com.au  Fact Sheet for Healthcare Providers:  IncredibleEmployment.be  This test is no t yet approved or cleared by the [place] FDA and  has been authorized for detection and/or diagnosis of SARS-CoV-2 by FDA under an Emergency Use Authorization (EUA). This EUA will remain  in effect (meaning this test can be used) for the duration of the COVID-19 declaration under Section 564(b)(1) of the Act, 21 U.S.C.section 360bbb-3(b)(1), unless the authorization is terminated  or revoked sooner.       Influenza A by PCR NEGATIVE NEGATIVE Final   Influenza B by PCR NEGATIVE NEGATIVE Final    Comment: (NOTE) The Xpert Xpress SARS-CoV-2/FLU/RSV plus assay is intended as an aid in the diagnosis of influenza from Nasopharyngeal swab specimens and should not be used  as a sole basis for treatment. Nasal washings and aspirates are unacceptable for Xpert Xpress SARS-CoV-2/FLU/RSV testing.  Fact Sheet for Patients: EntrepreneurPulse.com.au  Fact Sheet for Healthcare Providers: IncredibleEmployment.be  This test is not yet approved or cleared by the [place] FDA and has been authorized for detection and/or diagnosis of SARS-CoV-2 by FDA under an Emergency Use Authorization (EUA). This EUA will remain in effect (meaning this test can be used) for the duration of the COVID-19 declaration under Section 564(b)(1) of the Act, 21 U.S.C. section 360bbb-3(b)(1), unless the authorization is terminated or revoked.  Performed at [facility], [place] [address] [place], [place] [address]   Blood Culture (routine x 2)     Status: None  (Preliminary result)   Collection Time: 11/02/21 11:27 AM   Specimen: BLOOD  Result Value Ref Range Status   Specimen Description BLOOD BLOOD LEFT ARM  Final   Special Requests   Final    BOTTLES DRAWN AEROBIC AND ANAEROBIC Blood Culture adequate volume   Culture   Final    NO GROWTH < 24 HOURS Performed at [facility], [place] [address] [place], [place] [address]    Report Status PENDING  Incomplete  Blood Culture (routine x 2)     Status: None (Preliminary result)   Collection Time: 11/02/21 11:32 AM   Specimen: Left Antecubital; Blood  Result Value Ref Range Status   Specimen Description LEFT ANTECUBITAL  Final   Special Requests   Final    BOTTLES DRAWN AEROBIC AND ANAEROBIC Blood Culture adequate volume   Culture   Final    NO GROWTH < 24 HOURS Performed at [facility], [place] [address], [place], [place] [address]    Report Status PENDING  Incomplete  MRSA Next Gen by PCR, Nasal     Status: None   Collection Time: 11/02/21  3:00 PM   Specimen: Nasal Mucosa; Nasal Swab  Result Value Ref Range Status   MRSA by PCR Next Gen NOT DETECTED NOT DETECTED Final    Comment: (NOTE) The GeneXpert MRSA Assay (FDA approved for NASAL specimens only), is one component of a comprehensive MRSA colonization surveillance program. It is not intended to diagnose MRSA infection nor to guide or monitor treatment for MRSA infections. Test performance is not FDA approved in patients less than 62 years old. Performed at [facility], [place] [address], [place], [place] [address]          Radiology Studies: DG Chest Port 1 View  Result Date: 11/02/2021 CLINICAL DATA:  Questionable sepsis, shortness of breath. EXAM: PORTABLE CHEST 1 VIEW COMPARISON:  September 26, 2021 FINDINGS: The heart size and mediastinal contours are stable. Mild atelectasis of left lung base is identified. Both lungs are otherwise clear. The visualized skeletal structures are unremarkable. IMPRESSION: Mild  atelectasis of left lung base. Electronically Signed   By: Abelardo Diesel M.D.   On: 11/02/2021 12:06        Scheduled Meds:  amitriptyline  10 mg Oral Daily   amLODipine  10 mg Oral Daily   aspirin EC  81 mg Oral Daily   carvedilol  3.125 mg Oral BID WC   clopidogrel  75 mg Oral Daily   dicyclomine  20 mg Oral TID   dronabinol  2.5 mg Oral BID AC   feeding supplement  237 mL Oral BID BM   ferrous sulfate  325 mg Oral Q breakfast   umeclidinium bromide  1 puff  Inhalation Daily   And   fluticasone furoate-vilanterol  1 puff Inhalation Daily   heparin  5,000 Units Subcutaneous Q12H   hydrALAZINE  50 mg Oral Q8H   hydrOXYzine  25 mg Oral QHS   ipratropium  0.5 mg Nebulization Q6H   lidocaine  1 patch Transdermal Daily   magnesium oxide  400 mg Oral BID   methylPREDNISolone (SOLU-MEDROL) injection  80 mg Intravenous Q12H   multivitamin with minerals  1 tablet Oral QHS   polyethylene glycol  17 g Oral Daily   pregabalin  75 mg Oral Daily   rosuvastatin  40 mg Oral Daily   sodium bicarbonate  1,300 mg Oral TID   sodium zirconium cyclosilicate  10 g Oral Daily   valproic acid  250 mg Oral QHS   Continuous Infusions:  azithromycin Stopped (11/03/21 1221)   cefTRIAXone (ROCEPHIN)  IV Stopped (11/03/21 1200)     LOS: 1 day       Phillips Climes, MD [facility]   To contact the attending provider between 7A-7P or the covering provider during after hours 7P-7A, please log into the web site [email or web address] and access using universal [place] password for that web site. If you do not have the password, please call the hospital operator.  11/03/2021, 2:44 PM   Patient ID: [name], female   DOB: [birth year], 81 y.o.   MRN: [id number]

## 2021-11-03 NOTE — ED Notes (Signed)
Placed Breakfast Order 

## 2021-11-03 NOTE — ED Notes (Signed)
[facility] notified of drop in hemoglobin to 8.9. [place] to verify.

## 2021-11-03 NOTE — Progress Notes (Addendum)
Daily Progress Note   [name]       Date: 11/03/2021 DOB: 03/12/1941  Age: 81 y.o. MRN#: [id number] Attending Physician: Lequita Halt, MD Primary Care Physician: Pcp, No Admit Date: 11/02/2021  Reason for Consultation/Follow-up: Establishing goals of care  Subjective: Chart review performed. Received report from primary RN - no acute concerns.   Went to visit patient at bedside - she is awake, alert, oriented, and able to participate in conversation. She is frail and weak appearing. No signs or non-verbal gestures of pain or discomfort noted. No respiratory distress, increased work of breathing, or secretions noted. She denies pain or shortness of breath at this time and reports "feeling better than yesterday."   Reviewed goal to transition to oral steroid taper with her in 1-2 days - she is happy to hear this as she would "rather be home" but doesn't want to leave "too early." Explained I would call hospice and make them aware of her symptom management needs - she expressed appreciation.   Reviewed starting dilaudid PO yesterday - she explains it has helped and is appreciative to know she can receive it every 4 hours as needed.   All questions and concerns addressed. Encouraged to call with questions and/or concerns. PMT card previously provided.  Called [facility] liaison Esther Hardy - reviewed goal is transition IV to oral steroid taper with likely discharge in 1-3 days. Updated that [facility] will call regarding discharge when a more clear plan is in place. Also discussed and reviewed her high risk for rehospitalization due to symptoms and how she will likely have increased symptom management needs on discharge - he expressed understanding. They will review discharge  paperwork and adjust her medications/RN visits accordingly.    Length of Stay: 1  Current Medications: Scheduled Meds:   amitriptyline  10 mg Oral Daily   amLODipine  10 mg Oral Daily   aspirin EC  81 mg Oral Daily   carvedilol  3.125 mg Oral BID WC   clopidogrel  75 mg Oral Daily   dicyclomine  20 mg Oral TID   dronabinol  2.5 mg Oral BID AC   feeding supplement  237 mL Oral BID BM   ferrous sulfate  325 mg Oral Q breakfast   umeclidinium bromide  1 puff Inhalation Daily  And   fluticasone furoate-vilanterol  1 puff Inhalation Daily   heparin  5,000 Units Subcutaneous Q12H   hydrALAZINE  50 mg Oral Q8H   hydrOXYzine  25 mg Oral QHS   ipratropium  0.5 mg Nebulization Q6H   lidocaine  1 patch Transdermal Daily   magnesium oxide  400 mg Oral BID   methylPREDNISolone (SOLU-MEDROL) injection  80 mg Intravenous Q12H   multivitamin with minerals  1 tablet Oral QHS   polyethylene glycol  17 g Oral Daily   pregabalin  75 mg Oral Daily   rosuvastatin  40 mg Oral Daily   sodium bicarbonate  1,300 mg Oral TID   sodium zirconium cyclosilicate  10 g Oral Daily   valproic acid  250 mg Oral QHS    Continuous Infusions:  azithromycin Stopped (11/03/21 1221)   cefTRIAXone (ROCEPHIN)  IV Stopped (11/03/21 1200)    PRN Meds: acetaminophen **OR** acetaminophen, albuterol, diclofenac Sodium, HYDROmorphone, LORazepam, ondansetron **OR** ondansetron (ZOFRAN) IV  Physical Exam Vitals and nursing note reviewed.  Constitutional:      General: She is not in acute distress.    Appearance: She is cachectic. She is ill-appearing.  Pulmonary:     Effort: No respiratory distress.  Skin:    General: Skin is warm and dry.  Neurological:     Mental Status: She is alert and oriented to person, place, and time.     Motor: Weakness present.  Psychiatric:        Attention and Perception: Attention normal.        Behavior: Behavior is cooperative.        Cognition and Memory: Cognition and memory  normal.            Vital Signs: BP (!) 144/83 (BP Location: Left Arm)    Pulse 80    Temp 97.6 F (36.4 C) (Oral)    Resp 18    Ht 5\' 3"  (1.6 m)    Wt 43.1 kg    SpO2 97%    BMI 16.83 kg/m  SpO2: SpO2: 97 % O2 Device: O2 Device: Room Air O2 Flow Rate: O2 Flow Rate (L/min): 3 L/min  Intake/output summary:  Intake/Output Summary (Last 24 hours) at 11/03/2021 1522 Last data filed at 11/03/2021 1221 Gross per 24 hour  Intake 1902.31 ml  Output --  Net 1902.31 ml   LBM:   Baseline Weight: Weight: 43.1 kg Most recent weight: Weight: 43.1 kg       Palliative Assessment/Data: 20-30%    Flowsheet Rows    Flowsheet Row Most Recent Value  Intake Tab   Referral Department Hospitalist  Unit at Time of Referral ER  Palliative Care Primary Diagnosis Nephrology  Date Notified 11/02/21  Palliative Care Type Return patient Palliative Care  Reason for referral Clarify Goals of Care, Pain  Date of Admission 11/02/21  Date first seen by Palliative Care 11/02/21  # of days Palliative referral response time 0 Day(s)  # of days IP prior to Palliative referral 0  Clinical Assessment   Psychosocial & Spiritual Assessment   Palliative Care Outcomes   Patient/Family meeting held? Yes  Who was at the meeting? patient  Palliative Care Outcomes Improved pain interventions, Improved non-pain symptom therapy, Clarified goals of care, Counseled regarding hospice, Provided psychosocial or spiritual support  Patient/Family wishes: Interventions discontinued/not started  Mechanical Ventilation, Tube feedings/TPN, Hemodialysis, NIPPV, Trach, PEG       Patient Active Problem List   Diagnosis Date Noted   PNA (  pneumonia) 11/02/2021   Pseudoaneurysm ([place])    Pseudoaneurysm of femoral artery following procedure ([place]) 10/22/2021   Flank pain 09/60/4540   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, implant, and graft 09/29/2021   Bradycardia with 31-40 beats per minute 09/26/2021   Hypertensive crisis  09/10/2021   Rectal bleeding    Rectal pain    Malnutrition of moderate degree 06/17/2021   Weight loss 06/16/2021   Zoster 06/16/2021   Collapsed lung    COPD exacerbation ([place]) 09/26/2020   Cigarette smoker 09/26/2020   Shortness of breath 07/06/2020   Essential hypertension 07/06/2020   Abdominal pain 07/06/2020   Elevated INR 07/06/2020   Acute respiratory failure due to COVID-19 ([place]) 06/02/2020   Acute renal failure superimposed on chronic kidney disease ([place]) 02/06/2020   Leukocytosis 02/06/2020   Thrombocytosis 02/06/2020   Palliative care encounter    Constipation 01/05/2020   Protein-calorie malnutrition, severe ([place]) 01/04/2020   Acute on chronic renal failure ([place]) 01/04/2020   Hyperkalemia 01/03/2020   AKI (acute kidney injury) ([place]) 01/03/2020   Acute respiratory failure with hypoxia ([place]) 11/01/2019   UTI (urinary tract infection) 11/01/2019   Community acquired pneumonia 11/01/2019   Acute cystitis without hematuria    Encephalopathy 09/18/2019   SAH (subarachnoid hemorrhage) ([place]) 09/18/2019   Dementia ([place]) 98/08/9146   Metabolic acidosis 82/95/6213   ARF (acute renal failure) ([place]) 09/17/2019   Acute encephalopathy 09/17/2019   Abnormal CT scan, colon    Diarrhea    Diverticulitis 06/07/2019   Depression with anxiety 06/07/2019   DNR (do not resuscitate) 03/23/2019   Volume overload 03/23/2019   Edema 03/23/2019   Acute renal failure (ARF) ([place]) 03/08/2019   Hypokalemia 03/08/2019   Palliative care by specialist    Goals of care, counseling/discussion    Altered mental status    Encephalopathy, toxic 12/24/2018   Aspiration pneumonia of both lower lobes due to gastric secretions ([place]) 12/24/2018   Non-ST elevation (NSTEMI) myocardial infarction [facility])    Chest pain    Dyspnea on exertion    Elevated troponin level 12/09/2018   Chronic migraine 03/27/2018   Lumbar radiculopathy 03/27/2018   Gait abnormality 03/27/2018   Hypertensive urgency  12/28/2016   CKD (chronic kidney disease) stage 4, GFR 15-29 ml/min (HCC) 12/28/2016   Anemia 12/28/2016   Chronic pain syndrome 12/28/2016   Benzodiazepine withdrawal without complication ([place]) 08/65/7846   Chronic right shoulder pain 12/06/2016   Fall 09/17/2016   Fracture of humeral head, closed, right, initial encounter 09/17/2016   Rib fractures 09/17/2016   Multiple falls 09/17/2016   Severe muscle deconditioning 09/17/2016   Failure to thrive in adult 09/17/2016   Melena 04/25/2016   Transient hypotension 04/25/2016   Anxiety state 04/25/2016   Tobacco abuse 04/25/2016   Hyperlipidemia 04/25/2016   Syncope 04/24/2016   Near syncope 04/24/2016   Gait difficulty 01/12/2016   Foot drop, left 01/12/2016   Severe recurrent major depression without psychotic features ([place]) 08/28/2015   Spondylolisthesis at L4-L5 level 07/30/2015   PVD (peripheral vascular disease) ([place]) 07/29/2014   Weakness-Bilateral arm/leg 07/29/2014   Numbness-left leg 07/29/2014   Swelling of limb-Legs 07/29/2014   Atherosclerosis of native arteries of the extremities with intermittent claudication 09/30/2011    Palliative Care Assessment & Plan   Patient Profile: 81 y.o. female  with past medical history of failure to thrive BMI =16.8, CKD stage IV, COPD, HTN, HLD, right groin pseudoaneurysm not a candidate for repair, and COPD presented to ED on 11/02/21  from home with complaints of increasing shortness of breath x1day. Patient was admitted on 11/02/2021 with acute COPD exacerbation, combined non-anion gap, hyperkalemia, cachexia/severe protein calorie malnutrition, right groin pseudoaneurysm.    Of note, patient has been seen multiple times by PMT over several hospitalizations. She was most recently hospitalized from 1/4-10/24/21 with pyelonephritis. Patient was deemed not a surgical candidate for right groin pseudoaneurysm and was not a dialysis candidate. Patient was discharged home with hospice.    ED  Course: Patient was found to have tachycardia, tachypneic and hypoxia stable on 3 L. Exam: wheezing, chest x-ray does not show significant infiltrates. Patient was started on ceftriaxone and azithromycin.  Assessment: Acute COPD exacerbation/dyspnea Combined non-anion gap metabolic acidosis Respiratory alkalosis CKD stage IV Hyperkalemia Cachexia/severe protein calorie malnutrition Right groin pseudoaneurysm Hospice care patient  Recommendations/Plan: Treat the treatable - patient's goal is to feel less short of breath so she can, hopefully, return home with hospice Continue DNR/DNI as previously documented Continue dilaudid 1-2mg  PO q4h PRN pain/dyspnea/distress/RR >25 Continue lidocaine patch Spoke with hospice liaison and discussed her increased symptom management needs on discharge to prevent rehospitalization - patient's goal is not to be rehospitalized Order previously placed for TOC to call [facility] hospice to notify them of discharge when planned - they will schedule follow up visit for shortly after her discharge PMT will continue to follow peripherally. If there are any imminent needs please call the service directly   Goals of Care and Additional Recommendations: Limitations on Scope of Treatment: Avoid Hospitalization, Full Scope Treatment, No Artificial Feeding, and No Tracheostomy  Code Status:    Code Status Orders  (From admission, onward)           Start     Ordered   11/02/21 1500  Do not attempt resuscitation (DNR)  Continuous       Question Answer Comment  In the event of cardiac or respiratory ARREST Do not call a code blue   In the event of cardiac or respiratory ARREST Do not perform Intubation, CPR, defibrillation or ACLS   In the event of cardiac or respiratory ARREST Use medication by any route, position, wound care, and other measures to relive pain and suffering. May use oxygen, suction and manual treatment of airway obstruction as needed for  comfort.      11/02/21 1500           Code Status History     Date Active Date Inactive Code Status Order ID Comments User Context   10/22/2021 1723 10/24/2021 1752 DNR [id number]  Karmen Bongo, MD ED   09/26/2021 2213 10/09/2021 2127 DNR [id number]  Deland Pretty, MD ED   09/10/2021 1733 09/17/2021 1956 DNR [id number]  Karmen Bongo, MD ED   06/20/2021 1517 07/10/2021 2212 DNR [id number]  Jimmy Footman, NP Inpatient   06/16/2021 0839 06/20/2021 1517 Full Code [id number]  Norval Morton, MD ED   09/29/2020 1916 10/03/2020 1943 Full Code [id number]  Lorella Nimrod, MD ED   07/06/2020 2134 07/10/2020 1837 Full Code [id number]  Etta Quill, DO ED   06/03/2020 1305 06/07/2020 2227 Full Code [id number]  Elgergawy, Silver Huguenin, MD ED   06/02/2020 1938 06/03/2020 1305 DNR [id number]  Elwyn Reach, MD ED   02/06/2020 1459 02/10/2020 2254 DNR [id number]  Norval Morton, MD ED   02/06/2020 1424 02/06/2020 1459 Full Code [id number]  Norval Morton, MD ED   01/02/2020 2259 01/09/2020 0016 Full Code [id number]  Oswald Hillock, MD  Inpatient   11/01/2019 0353 11/04/2019 2028 DNR [id number]  Rise Patience, MD ED   09/17/2019 2155 09/22/2019 1934 DNR [id number]  Rise Patience, MD ED   09/17/2019 2146 09/17/2019 2155 DNR [id number]  Rise Patience, MD ED   06/07/2019 0900 06/16/2019 2012 DNR [id number]  Karmen Bongo, MD Inpatient   03/23/2019 0210 03/23/2019 1840 DNR [id number]  Roney Jaffe, MD Inpatient   03/08/2019 1819 03/12/2019 1741 Full Code [id number]  Donne Hazel, MD ED   12/31/2018 1400 01/09/2019 1450 DNR [id number]  Micheline Rough, MD Inpatient   12/24/2018 1641 12/31/2018 1400 Full Code [id number]  Perry, [place], DO ED   12/10/2018 1837 12/13/2018 1920 DNR [id number]  Adrian Prows, MD Inpatient   12/09/2018 1647 12/10/2018 1836 Full Code [id number]  Truett Mainland, DO Inpatient   06/23/2017 1811 06/24/2017 1915 Full Code [id number]  Doreatha Lew, MD ED   06/14/2017 1209 06/14/2017 1824  Full Code [id number]  Nigel Mormon, MD Inpatient   12/28/2016 0016 12/29/2016 2137 Full Code [id number]  Edwin Dada, MD ED   09/17/2016 2114 09/20/2016 2340 Full Code [id number]  Roney Jaffe, MD Inpatient   04/24/2016 2321 04/26/2016 1845 Full Code [id number]  Norval Morton, MD ED   08/27/2015 2101 08/30/2015 1456 Full Code [id number]  Harvel Quale, MD ED   08/26/2015 1802 08/27/2015 0345 Full Code [id number]  Voncille Lo, MD ED   08/01/2015 1727 08/04/2015 1656 Full Code [id number]  Kary Kos, MD Inpatient   07/30/2015 1848 08/01/2015 1727 Full Code [id number]  Kary Kos, MD ED       Prognosis:  < 6 months  Discharge Planning: Home with Hospice  Care plan was discussed with primary RN, patient  Thank you for allowing the Palliative Medicine Team to assist in the care of this patient.  Total time: 35 minutes    Greater than 50%  of this time was spent counseling and coordinating care related to the above assessment and plan.  Lin Landsman, NP  Please contact Palliative Medicine Team phone at [phone] for questions and concerns.

## 2021-11-03 NOTE — ED Notes (Signed)
Lunch Ordered °

## 2021-11-03 NOTE — Evaluation (Signed)
Clinical/Bedside Swallow Evaluation Patient Details  Name: Alyssa [name] MRN: [id number] Date of Birth: 08/12/1941  Today's Date: 11/03/2021 Time: SLP Start Time (ACUTE ONLY): 2831 SLP Stop Time (ACUTE ONLY): 1001 SLP Time Calculation (min) (ACUTE ONLY): 10 min  Past Medical History:  Past Medical History:  Diagnosis Date   Anxiety    Arthritis    Chronic kidney disease    Headache(784.0)    Hyperlipidemia    Hypertension    Peripheral neuropathy    Peripheral vascular disease ([place])    Syncope 09/2020   Past Surgical History:  Past Surgical History:  Procedure Laterality Date   ABDOMINAL ANGIOGRAM N/A 10/29/2011   Procedure: ABDOMINAL ANGIOGRAM;  Surgeon: Elam Dutch, MD;  Location[facility] CATH LAB;  Service: Cardiovascular;  Laterality: N/A;   BACK SURGERY     COLONOSCOPY     ESOPHAGOGASTRODUODENOSCOPY     FEMORAL-FEMORAL BYPASS GRAFT  08/31/2010   FLEXIBLE SIGMOIDOSCOPY N/A 06/12/2019   Procedure: FLEXIBLE SIGMOIDOSCOPY;  Surgeon: Ladene Artist, MD;  Location: [place];  Service: Endoscopy;  Laterality: N/A;  Patient had little bit to eat this morning so this will be unsedated.   JOINT REPLACEMENT Right 10/18/2010   Hip   LOWER EXTREMITY ANGIOGRAPHY  06/14/2017   Procedure: Lower Extremity Angiography;  Surgeon: Adrian Prows, MD;  Location: [place] CV LAB;  Service: Cardiovascular;;  Bilateral limited angio performed   RENAL ANGIOGRAPHY N/A 06/14/2017   Procedure: Renal Angiography;  Surgeon: Adrian Prows, MD;  Location: [place] CV LAB;  Service: Cardiovascular;  Laterality: N/A;   TOTAL HIP ARTHROPLASTY     right   HPI:  Alyssa [name] is a 81 y.o. female who presented with increasing shortness of breath, patient noted report chills at home.  CXR 1/16: "Mild atelectasis of left lung base is identified. Both lungs are otherwise clear."   Pt with medical history significant of failure to thrive BMI =16.8, CKD stage IV, COPD, HTN, HLD, right groin  pseudoaneurysm not a candidate for repair. Pt recently d/c'd home with hospice following admission for UTI."    Assessment / Plan / Recommendation  Clinical Impression  Pt presents with functional swallowing as assessed clinically.  Pt tolerated all consistencies trialed, including serial straw sips of thin liquid without any clinical s/s of aspiration.  Pt known to this service from prior admission with recommendation for Dysphagia 3 and thin liquid in December of 2020.  Today there was prolonged oral phase with regular solid and mild-moderate diffuse oral reside which fully cleared with liquid wash.  Pt's chest xray revealed "mild atelectasis of left lung base is identified. Both lungs are otherwise clear."   Recommend continuing regular texture diet with thin liquid.  Pt has no further ST needs at this time; SLP will sign off.  SLP Visit Diagnosis: Dysphagia, unspecified (R13.10)    Aspiration Risk  No limitations    Diet Recommendation Regular;Thin liquid   Liquid Administration via: Cup;Straw Medication Administration: Whole meds with liquid Supervision: Patient able to self feed;Staff to assist with self feeding Compensations: Slow rate;Small sips/bites;Follow solids with liquid Postural Changes: Seated upright at 90 degrees    Other  Recommendations Oral Care Recommendations: Oral care BID    Recommendations for follow up therapy are one component of a multi-disciplinary discharge planning process, led by the attending physician.  Recommendations may be updated based on patient status, additional functional criteria and insurance authorization.  Follow up Recommendations No SLP follow up  Assistance Recommended at Discharge None  Functional Status Assessment Patient has not had a recent decline in their functional status  Frequency and Duration  (N/A)          Prognosis Prognosis for Safe Diet Advancement:  (N/A)      Swallow Study   General Date of Onset:  11/02/21 HPI: Alyssa [name] is a 81 y.o. female who presented with increasing shortness of breath, patient noted report chills at home.  CXR 1/16: "Mild atelectasis of left lung base is identified. Both lungs are otherwise clear."   Pt with medical history significant of failure to thrive BMI =16.8, CKD stage IV, COPD, HTN, HLD, right groin pseudoaneurysm not a candidate for repair. Pt recently d/c'd home with hospice following admission for UTI." Type of Study: Bedside Swallow Evaluation Previous Swallow Assessment: Clinical assessment Dec 2020 Diet Prior to this Study: Regular;Thin liquids Temperature Spikes Noted: No Respiratory Status: Nasal cannula History of Recent Intubation: No Behavior/Cognition: Alert;Cooperative;Pleasant mood Oral Cavity Assessment: Within Functional Limits Oral Care Completed by SLP: No Oral Cavity - Dentition: Adequate natural dentition;Missing dentition Vision: Functional for self-feeding Self-Feeding Abilities: Able to feed self Patient Positioning: Upright in bed Baseline Vocal Quality: Normal Volitional Cough:  (Fair) Volitional Swallow: Able to elicit    Oral/Motor/Sensory Function Overall Oral Motor/Sensory Function: Mild impairment Facial ROM: Reduced right;Reduced left Facial Symmetry: Within Functional Limits Lingual ROM: Reduced right;Reduced left (tremor noted) Lingual Symmetry: Within Functional Limits Lingual Strength: Reduced Velum: Within Functional Limits Mandible:  (Reduced)   Ice Chips Ice chips: Not tested   Thin Liquid Thin Liquid: Within functional limits Presentation: Cup;Straw    Nectar Thick Nectar Thick Liquid: Not tested   Honey Thick Honey Thick Liquid: Not tested   Puree Puree: Within functional limits Presentation: Spoon   Solid     Solid: Impaired Presentation: Self Fed Oral Phase Functional Implications: Oral residue;Prolonged oral transit      Celedonio Savage , MA, [place] Office: [phone]  11/03/2021,10:11 AM

## 2021-11-04 DIAGNOSIS — I10 Essential (primary) hypertension: Secondary | ICD-10-CM

## 2021-11-04 LAB — LEGIONELLA PNEUMOPHILA SEROGP 1 UR AG: L. pneumophila Serogp 1 Ur Ag: NEGATIVE

## 2021-11-04 MED ORDER — ARFORMOTEROL TARTRATE 15 MCG/2ML IN NEBU
15.0000 ug | INHALATION_SOLUTION | Freq: Two times a day (BID) | RESPIRATORY_TRACT | Status: DC
  Administered 2021-11-04 – 2021-11-12 (×16): 15 ug via RESPIRATORY_TRACT
  Filled 2021-11-04 (×16): qty 2

## 2021-11-04 MED ORDER — IPRATROPIUM BROMIDE 0.02 % IN SOLN
0.5000 mg | Freq: Two times a day (BID) | RESPIRATORY_TRACT | Status: DC
  Administered 2021-11-04: 0.5 mg via RESPIRATORY_TRACT
  Filled 2021-11-04: qty 2.5

## 2021-11-04 MED ORDER — NEPRO/CARBSTEADY PO LIQD
237.0000 mL | Freq: Two times a day (BID) | ORAL | Status: DC
  Administered 2021-11-05 – 2021-11-11 (×12): 237 mL via ORAL

## 2021-11-04 MED ORDER — BUDESONIDE 0.25 MG/2ML IN SUSP
0.2500 mg | Freq: Two times a day (BID) | RESPIRATORY_TRACT | Status: DC
  Administered 2021-11-04 – 2021-11-12 (×16): 0.25 mg via RESPIRATORY_TRACT
  Filled 2021-11-04 (×16): qty 2

## 2021-11-04 MED ORDER — METHYLPREDNISOLONE SODIUM SUCC 40 MG IJ SOLR
40.0000 mg | Freq: Two times a day (BID) | INTRAMUSCULAR | Status: DC
  Administered 2021-11-04 – 2021-11-05 (×2): 40 mg via INTRAVENOUS
  Filled 2021-11-04 (×2): qty 1

## 2021-11-04 MED ORDER — IPRATROPIUM-ALBUTEROL 0.5-2.5 (3) MG/3ML IN SOLN
3.0000 mL | Freq: Three times a day (TID) | RESPIRATORY_TRACT | Status: DC
  Administered 2021-11-04 – 2021-11-12 (×24): 3 mL via RESPIRATORY_TRACT
  Filled 2021-11-04 (×22): qty 3

## 2021-11-04 NOTE — Progress Notes (Signed)
Physical Therapy Evaluation Patient Details Name: Alyssa [name] MRN: [id number] DOB: 11-15-1940 Today's Date: 11/04/2021  History of Present Illness  pt is a 81 y/o female admitted 1/16 with increasing SOB and worsening weakness.  Work up includes COPD exacerbation, protein calorie malnutrition and CKD.  PMH includes HTN; HLD; PVD; dementia; and CKD.  Clinical Impression  Pt admitted with/for SOB, worsening weakness and general FTT.Marland Kitchen  Pt currently limited functionally due to the problems listed below.  (see problems list.)  Pt will benefit from PT to maximize function and safety to be able to get home safely with available assist.        Recommendations for follow up therapy are one component of a multi-disciplinary discharge planning process, led by the attending physician.  Recommendations may be updated based on patient status, additional functional criteria and insurance authorization.  Follow Up Recommendations Home health PT (if pt has sufficient help to not be alone for time taking more than transition of care givers and quick errands.)    Assistance Recommended at Discharge Frequent or constant Supervision/Assistance (most of the time, up to 24 hours)  Patient can return home with the following  A lot of help with walking and/or transfers;A little help with bathing/dressing/bathroom;Assistance with cooking/housework;Help with stairs or ramp for entrance    Equipment Recommendations  (hospital bed or rail for her bed)  Recommendations for Other Services       Functional Status Assessment Patient has had a recent decline in their functional status and demonstrates the ability to make significant improvements in function in a reasonable and predictable amount of time.     Precautions / Restrictions Precautions Precautions: Fall      Mobility  Bed Mobility Overal bed mobility: Needs Assistance Bed Mobility: Supine to Sit, Sit to Supine     Supine to sit: Min  assist Sit to supine: Mod assist   General bed mobility comments: needs extra time and help with coordination of trunk to help build momentum, moderate assist of LE's and scooting assist to middle of the bed.    Transfers   Equipment used: 1 person hand held assist Transfers: Sit to/from Stand Sit to Stand: Min assist           General transfer comment: minimal assist to help forward and boost.    Ambulation/Gait Ambulation/Gait assistance: Mod assist Gait Distance (Feet): 5 Feet (L and R) Assistive device: 1 person hand held assist Gait Pattern/deviations: Step-to pattern, Decreased stride length       General Gait Details: unsteady side-stepping 5 feet to the right then 5 feet up toward HOB to postion to lie down.  Stairs            Wheelchair Mobility    Modified Rankin (Stroke Patients Only)       Balance Overall balance assessment: Needs assistance Sitting-balance support: No upper extremity supported, Single extremity supported, Feet supported Sitting balance-Leahy Scale: Fair     Standing balance support: Single extremity supported, Bilateral upper extremity supported Standing balance-Leahy Scale: Poor Standing balance comment: dependent on at least one UE support                             Pertinent Vitals/Pain Pain Assessment Pain Assessment: Faces Faces Pain Scale: No hurt Pain Intervention(s): Monitored during session    Home Living Family/patient expects to be discharged to:: Private residence Living Arrangements: Children Available Help at Discharge: Family;Friend(s);Personal care attendant (  help not available fully 24 hours) Type of Home: House Home Access: Stairs to enter Entrance Stairs-Rails: Can reach both;Left;Right Entrance Stairs-Number of Steps: 4   Home Layout: One level Home Equipment: Conservation officer, nature (2 wheels);Rollator (4 wheels);Tub bench;BSC/3in1 Additional Comments: Pt has specialized shoes that she wears  due to RLE leg discrepancy.    Prior Function Prior Level of Function : Needs assist       Physical Assist : Mobility (physical);ADLs (physical)     Mobility Comments: Generally walks in the home with Mod I and 4WRW ADLs Comments: Able to complete her own self care, son cooks and drives, sister performs home management and helps with medications. she also has an aide that comes 2 hours a day     Hand Dominance   Dominant Hand: Right    Extremity/Trunk Assessment   Upper Extremity Assessment Upper Extremity Assessment: Generalized weakness    Lower Extremity Assessment Lower Extremity Assessment: Generalized weakness RLE Deficits / Details: leg length descrepancy, less coordination R LE vs L LE, weak df.    Cervical / Trunk Assessment Cervical / Trunk Assessment: Kyphotic  Communication   Communication: No difficulties  Cognition Arousal/Alertness: Awake/alert Behavior During Therapy: WFL for tasks assessed/performed Overall Cognitive Status: No family/caregiver present to determine baseline cognitive functioning                                 General Comments: pt follows direction with some cues well.  Given time answers questions appropriately        General Comments General comments (skin integrity, edema, etc.): HR into the 110's, Sats 91% with mobility on RA    Exercises Other Exercises Other Exercises: Warm up ROM LE's prior to mobility   Assessment/Plan    PT Assessment Patient needs continued PT services  PT Problem List Decreased strength;Decreased activity tolerance;Decreased balance       PT Treatment Interventions DME instruction;Gait training;Functional mobility training;Stair training;Therapeutic activities;Balance training;Patient/family education    PT Goals (Current goals can be found in the Care Plan section)  Acute Rehab PT Goals Patient Stated Goal: I want to go home. PT Goal Formulation: With patient Time For Goal  Achievement: 11/18/21 Potential to Achieve Goals: Fair    Frequency Min 3X/week     Co-evaluation               AM-PAC PT "6 Clicks" Mobility  Outcome Measure Help needed turning from your back to your side while in a flat bed without using bedrails?: A Little Help needed moving from lying on your back to sitting on the side of a flat bed without using bedrails?: A Little Help needed moving to and from a bed to a chair (including a wheelchair)?: A Little Help needed standing up from a chair using your arms (e.g., wheelchair or bedside chair)?: A Lot Help needed to walk in hospital room?: A Lot Help needed climbing 3-5 steps with a railing? : A Lot 6 Click Score: 15    End of Session   Activity Tolerance: Patient tolerated treatment well;Patient limited by fatigue Patient left: in bed;with call bell/phone within reach Nurse Communication: Mobility status PT Visit Diagnosis: Other abnormalities of gait and mobility (R26.89);Muscle weakness (generalized) (M62.81);Difficulty in walking, not elsewhere classified (R26.2)    Time: 9326-7124 PT Time Calculation (min) (ACUTE ONLY): 31 min   Charges:   PT Evaluation $PT Eval Moderate Complexity: 1 Mod PT Treatments $Therapeutic  Activity: 8-22 mins        11/04/2021  Ginger Carne., PT Acute Rehabilitation Services [phone]  (pager) [phone]  (office)  Tessie Fass Hamza Empson 11/04/2021, 3:52 PM

## 2021-11-04 NOTE — Progress Notes (Addendum)
PROGRESS NOTE    Alyssa [name]  IFO:[id number] DOB: 02-13-1941 DOA: 11/02/2021 PCP: Pcp, No   Chief Complaint  Patient presents with   Shortness of Breath    Brief Narrative:  Alyssa [name] is a 81 y.o. 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  Subjective: Breathing better-still with some wheezing.  Objective: Vitals: Blood pressure 136/88, pulse 82, temperature 98 F (36.7 C), temperature source Oral, resp. rate 13, height 5\' 3"  (1.6 m), weight 43.1 kg, SpO2 97 %.   Exam: Gen Exam:Alert awake-not in any distress HEENT:atraumatic, normocephalic Chest: Moving air well-still with expiratory rhonchi. CVS:S1S2 regular Abdomen:soft non tender, non distended Extremities:no edema Neurology: Non focal Skin: no rash    Assessment & Plan: COPD exacerbation: Slowly improving-decrease Solu-Medrol-continue bronchodilators and empiric antimicrobial therapy.  Also improving-not yet back to baseline.  Hyperkalemia: Resolved-continue Lokelma.  Right groin pseudoaneurysm: Evaluated by vascular surgery during her most recent hospitalization-not a candidate for surgical repair.  Continue supportive care-palliative care evaluation appreciated-Home hospice on discharge.     PAD: Continue aspirin/Plavix/statin-given poor overall state of health/frailty-do not think any further intervention is warranted/indicated.  Stage IV CKD: Renal function stable and close to baseline-has been deemed not a HD candidate per prior notes.  Had a recent admission for hyperkalemia-since then maintained on [place].  Normocytic anemia: Due to CKD-no indication for transfusion.   HTN: BP stable-continue Norvasc, hydralazine and Coreg.   HLD: Continue Crestor  Dementia: Appears mild-continue Depakote/Vistaril and  amitriptyline.  Maintain delirium precautions.   Chronic pain: Continue Lyrica and as needed narcotics/Robaxin.    Palliative care: DNR already in place-appears very frail-clearly not a candidate for aggressive care.  Per vascular surgery not a candidate for repair of pseudoaneurysm of her right groin.  Per prior notes-not a candidate for HD.  Efforts ongoing to arrange for outpatient hospice.   DVT prophylaxis: Heparin Code Status: DNR Family Communication: None at bedside Disposition:   Status is: Inpatient  Remains inpatient appropriate because: Still with dyspnea, wheezing on IV steroids      Consultants:  Palliative medicine S   Data Reviewed: I have personally reviewed following labs and imaging studies  CBC: Recent Labs  Lab 11/02/21 1250 11/02/21 1524 11/03/21 0431  WBC 8.3  --  8.6  NEUTROABS 6.4  --   --   HGB 12.7 8.8* 8.9*  HCT 39.9 26.0* 26.7*  MCV 84.4  --  82.9  PLT 352  --  409*     Basic Metabolic Panel: Recent Labs  Lab 11/02/21 1250 11/02/21 1524  NA 142 143  K 5.8* 5.0  CL 111  --   CO2 16*  --   GLUCOSE 101*  --   BUN 37*  --   CREATININE 2.73*  --   CALCIUM 9.2  --      GFR: Estimated Creatinine Clearance: 11.6 mL/min (A) (by C-G formula based on SCr of 2.73 mg/dL (H)).  Liver Function Tests: Recent Labs  Lab 11/02/21 1250  AST 39  ALT 16  ALKPHOS 79  BILITOT 0.8  PROT 6.8  ALBUMIN 3.1*     CBG: No results for input(s): GLUCAP in the last 168 hours.   Recent Results (from the past 240 hour(s))  Resp Panel by RT-PCR (Flu A&B, Covid) Nasopharyngeal Swab     Status: None  Collection Time: 11/02/21 11:27 AM   Specimen: Nasopharyngeal Swab; Nasopharyngeal(NP) swabs in vial transport medium  Result Value Ref Range Status   SARS Coronavirus 2 by RT PCR NEGATIVE NEGATIVE Final    Comment: (NOTE) SARS-CoV-2 target nucleic acids are NOT DETECTED.  The SARS-CoV-2 RNA is generally detectable in upper  respiratory specimens during the acute phase of infection. The lowest concentration of SARS-CoV-2 viral copies this assay can detect is 138 copies/mL. A negative result does not preclude SARS-Cov-2 infection and should not be used as the sole basis for treatment or other patient management decisions. A negative result may occur with  improper specimen collection/handling, submission of specimen other than nasopharyngeal swab, presence of viral mutation(s) within the areas targeted by this assay, and inadequate number of viral copies(<138 copies/mL). A negative result must be combined with clinical observations, patient history, and epidemiological information. The expected result is Negative.  Fact Sheet for Patients:  EntrepreneurPulse.com.au  Fact Sheet for Healthcare Providers:  IncredibleEmployment.be  This test is no t yet approved or cleared by the [place] FDA and  has been authorized for detection and/or diagnosis of SARS-CoV-2 by FDA under an Emergency Use Authorization (EUA). This EUA will remain  in effect (meaning this test can be used) for the duration of the COVID-19 declaration under Section 564(b)(1) of the Act, 21 U.S.C.section 360bbb-3(b)(1), unless the authorization is terminated  or revoked sooner.       Influenza A by PCR NEGATIVE NEGATIVE Final   Influenza B by PCR NEGATIVE NEGATIVE Final    Comment: (NOTE) The Xpert Xpress SARS-CoV-2/FLU/RSV plus assay is intended as an aid in the diagnosis of influenza from Nasopharyngeal swab specimens and should not be used as a sole basis for treatment. Nasal washings and aspirates are unacceptable for Xpert Xpress SARS-CoV-2/FLU/RSV testing.  Fact Sheet for Patients: EntrepreneurPulse.com.au  Fact Sheet for Healthcare Providers: IncredibleEmployment.be  This test is not yet approved or cleared by the [place] FDA and has been  authorized for detection and/or diagnosis of SARS-CoV-2 by FDA under an Emergency Use Authorization (EUA). This EUA will remain in effect (meaning this test can be used) for the duration of the COVID-19 declaration under Section 564(b)(1) of the Act, 21 U.S.C. section 360bbb-3(b)(1), unless the authorization is terminated or revoked.  Performed at [facility], [place] [address], [place], [place] [address]   Blood Culture (routine x 2)     Status: None (Preliminary result)   Collection Time: 11/02/21 11:27 AM   Specimen: BLOOD  Result Value Ref Range Status   Specimen Description BLOOD BLOOD LEFT ARM  Final   Special Requests   Final    BOTTLES DRAWN AEROBIC AND ANAEROBIC Blood Culture adequate volume   Culture   Final    NO GROWTH 2 DAYS Performed at [facility], [place] [address], [place], [place] [address]    Report Status PENDING  Incomplete  Blood Culture (routine x 2)     Status: None (Preliminary result)   Collection Time: 11/02/21 11:32 AM   Specimen: Left Antecubital; Blood  Result Value Ref Range Status   Specimen Description LEFT ANTECUBITAL  Final   Special Requests   Final    BOTTLES DRAWN AEROBIC AND ANAEROBIC Blood Culture adequate volume   Culture   Final    NO GROWTH 2 DAYS Performed at [facility], [place] [address] [place], [place] [address]    Report Status PENDING  Incomplete  MRSA Next Gen  by PCR, Nasal     Status: None   Collection Time: 11/02/21  3:00 PM   Specimen: Nasal Mucosa; Nasal Swab  Result Value Ref Range Status   MRSA by PCR Next Gen NOT DETECTED NOT DETECTED Final    Comment: (NOTE) The GeneXpert MRSA Assay (FDA approved for NASAL specimens only), is one component of a comprehensive MRSA colonization surveillance program. It is not intended to diagnose MRSA infection nor to guide or monitor treatment for MRSA infections. Test performance is not FDA approved in patients less than 73 years old. Performed at [facility], [place] [address], [place], [place] [address]           Radiology Studies: No results found.      Scheduled Meds:  amitriptyline  10 mg Oral Daily   amLODipine  10 mg Oral Daily   aspirin EC  81 mg Oral Daily   carvedilol  3.125 mg Oral BID WC   clopidogrel  75 mg Oral Daily   dicyclomine  20 mg Oral TID   dronabinol  2.5 mg Oral BID AC   feeding supplement  237 mL Oral BID BM   ferrous sulfate  325 mg Oral Q breakfast   umeclidinium bromide  1 puff Inhalation Daily   And   fluticasone furoate-vilanterol  1 puff Inhalation Daily   heparin  5,000 Units Subcutaneous Q12H   hydrALAZINE  50 mg Oral Q8H   hydrOXYzine  25 mg Oral QHS   ipratropium  0.5 mg Nebulization BID   lidocaine  1 patch Transdermal Daily   magnesium oxide  400 mg Oral BID   methylPREDNISolone (SOLU-MEDROL) injection  80 mg Intravenous Q12H   multivitamin with minerals  1 tablet Oral QHS   polyethylene glycol  17 g Oral Daily   pregabalin  75 mg Oral Daily   rosuvastatin  40 mg Oral Daily   sodium bicarbonate  1,300 mg Oral TID   sodium zirconium cyclosilicate  10 g Oral Daily   valproic acid  250 mg Oral QHS   Continuous Infusions:  azithromycin 500 mg (11/04/21 1223)   cefTRIAXone (ROCEPHIN)  IV 2 g (11/04/21 1147)     LOS: 2 days       Oren Binet, MD [facility]   To contact the attending provider between 7A-7P or the covering provider during after hours 7P-7A, please log into the web site [email or web address] and access using universal  password for that web site. If you do not have the password, please call the hospital operator.  11/04/2021, 12:27 PM   Patient ID: [name], female   DOB: [birth year], 81 y.o.   MRN: [id number]

## 2021-11-04 NOTE — Progress Notes (Signed)
Initial Nutrition Assessment  DOCUMENTATION CODES:   Severe malnutrition in context of chronic illness, Underweight  INTERVENTION:  - Discontinue Ensure - Nepro Shake po BID, each supplement provides 425 kcal and 19 grams protein (prefers butter pecan) - Continue MVI with minerals daily  NUTRITION DIAGNOSIS:   Severe Malnutrition related to chronic illness (COPD, CKD stage IV) as evidenced by severe fat depletion, severe muscle depletion.  GOAL:   Patient will meet greater than or equal to 90% of their needs  MONITOR:   PO intake, Supplement acceptance, Labs, Weight trends  REASON FOR ASSESSMENT:   Consult Assessment of nutrition requirement/status, Calorie Count  ASSESSMENT:   Pt admitted with SOB secondary to COPD exacerbation and PNA. PMH includes failure to thrive, CKD stage IV, COPD, HTN, HLD and R groin pseudoaneurysm. Pt with multiple prior hospitalization with most previous being 1/4-1/7 for pyelonephritis and was d/c with home hospice.  Palliative care following pt. Patient's goal is to treat the treatable, no artificial feeding. Calorie count would not be beneficial at this time as TF not desired treatment plan for pt. Will address nutritional adequacy via regular diet and addition of nutrition supplements.  Pt provided limited history but reports eating 2 meals a day since last admission. Noted from previous admission pt enjoys Nepro butter pecan. Will d/c Ensure and order Nepro as this will provide more calories and enhance nutritional adequacy.  Meal completion: 25% x 1 recorded meal  Pt denies recent weight loss. Weight noted to be 95 lbs this admission which is up 7 lbs from prior admission 10/21/21.   Medications: marinol, ferrous sulfate, mag-ox, MVI daily, lokelma, IV abx  Labs: reviewed  NUTRITION - FOCUSED PHYSICAL EXAM:  Flowsheet Row Most Recent Value  Orbital Region Moderate depletion  Upper Arm Region Severe depletion  Thoracic and Lumbar Region  Severe depletion  Buccal Region Moderate depletion  Temple Region Moderate depletion  Clavicle Bone Region Moderate depletion  Clavicle and Acromion Bone Region Severe depletion  Scapular Bone Region Moderate depletion  Dorsal Hand Severe depletion  Patellar Region Severe depletion  Anterior Thigh Region Severe depletion  Posterior Calf Region Severe depletion  Edema (RD Assessment) None  Hair Reviewed  Eyes Reviewed  Mouth Reviewed  Skin Reviewed  Nails Reviewed      Diet Order:   Diet Order             Diet regular Room service appropriate? Yes; Fluid consistency: Thin  Diet effective now                   EDUCATION NEEDS:   Not appropriate for education at this time  Skin:  Skin Assessment: Reviewed RN Assessment  Last BM:  1/18 type 6  Height:   Ht Readings from Last 1 Encounters:  11/02/21 5\' 3"  (1.6 m)    Weight:   Wt Readings from Last 1 Encounters:  11/02/21 43.1 kg    BMI:  Body mass index is 16.83 kg/m.  Estimated Nutritional Needs:   Kcal:  1600-1800  Protein:  70-85g  Fluid:  >/=1.6L  Clayborne Dana, RDN, LDN Clinical Nutrition

## 2021-11-05 DIAGNOSIS — E875 Hyperkalemia: Secondary | ICD-10-CM

## 2021-11-05 LAB — URINE CULTURE: Culture: 2000 — AB

## 2021-11-05 LAB — BASIC METABOLIC PANEL
Anion gap: 13 (ref 5–15)
BUN: 56 mg/dL — ABNORMAL HIGH (ref 8–23)
CO2: 21 mmol/L — ABNORMAL LOW (ref 22–32)
Calcium: 9 mg/dL (ref 8.9–10.3)
Chloride: 106 mmol/L (ref 98–111)
Creatinine, Ser: 2.82 mg/dL — ABNORMAL HIGH (ref 0.44–1.00)
GFR, Estimated: 17 mL/min — ABNORMAL LOW (ref >60)
Glucose, Bld: 146 mg/dL — ABNORMAL HIGH (ref 70–99)
Potassium: 5.5 mmol/L — ABNORMAL HIGH (ref 3.5–5.1)
Sodium: 140 mmol/L (ref 135–145)

## 2021-11-05 MED ORDER — SODIUM ZIRCONIUM CYCLOSILICATE 10 G PO PACK
10.0000 g | PACK | Freq: Two times a day (BID) | ORAL | Status: DC
  Administered 2021-11-05 – 2021-11-08 (×7): 10 g via ORAL
  Filled 2021-11-05 (×7): qty 1

## 2021-11-05 MED ORDER — PREDNISONE 20 MG PO TABS
40.0000 mg | ORAL_TABLET | Freq: Every day | ORAL | Status: DC
  Administered 2021-11-06: 40 mg via ORAL
  Filled 2021-11-05: qty 2

## 2021-11-05 MED ORDER — AZITHROMYCIN 500 MG PO TABS
500.0000 mg | ORAL_TABLET | Freq: Once | ORAL | Status: AC
  Administered 2021-11-06: 500 mg via ORAL
  Filled 2021-11-05: qty 1

## 2021-11-05 NOTE — Progress Notes (Signed)
PROGRESS NOTE    Alyssa [name]  XLK:[id number] DOB: 09-04-1941 DOA: 11/02/2021 PCP: Pcp, No   Chief Complaint  Patient presents with   Shortness of Breath    Brief Narrative:  Alyssa [name] is a 81 y.o. 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  Subjective: Lying comfortably in bed-still wheezing but overall better.  Some cough continues  Objective: Vitals: Blood pressure (!) 154/91, pulse 82, temperature (!) 97.3 F (36.3 C), temperature source Axillary, resp. rate 15, height 5\' 3"  (1.6 m), weight 43.1 kg, SpO2 100 %.   Exam: Gen Exam:Alert awake-not in any distress HEENT:atraumatic, normocephalic Chest: Moving air well-scattered wheezing. CVS:S1S2 regular Abdomen:soft non tender, non distended Extremities:no edema Neurology: Non focal Skin: no rash    Assessment & Plan: COPD exacerbation: Slowly improving-continue to taper down steroids-remains on bronchodilators and empiric antimicrobial therapy.  Although improving-not yet back to baseline.  .  Hyperkalemia: Continues to have mild hyperkalemia-change Lokelma to twice daily dosing.  Recheck electrolytes tomorrow.  Right groin pseudoaneurysm: Evaluated by vascular surgery during her most recent hospitalization-not a candidate for surgical repair.  Continue supportive care-palliative care evaluation appreciated-Home hospice on discharge.     PAD: Continue aspirin/Plavix/statin-given poor overall state of health/frailty-do not think any further intervention is warranted/indicated.  Stage IV CKD: Renal function stable and close to baseline-has been deemed not a HD candidate per prior notes.  Continues to have intermittent hyperkalemia-on Lokelma.  Normocytic anemia: Due to CKD-no indication for transfusion.   HTN: BP  stable-continue Norvasc, hydralazine and Coreg.   HLD: Continue Crestor  Dementia: Appears mild-continue Depakote/Vistaril and amitriptyline.  Maintain delirium precautions.   Chronic pain: Continue Lyrica and as needed narcotics/Robaxin.    Palliative care: DNR already in place-appears very frail-clearly not a candidate for aggressive care.  Per vascular surgery not a candidate for repair of pseudoaneurysm of her right groin.  Per prior notes-not a candidate for HD.  Per social worker/case manager-family requesting SNF placement.   DVT prophylaxis: Heparin Code Status: DNR Family Communication: None at bedside Disposition:   Status is: Inpatient  Remains inpatient appropriate because: Slowly resolving COPD exacerbation-family requesting SNF placement-in progress.      Consultants:  Palliative medicine S   Data Reviewed: I have personally reviewed following labs and imaging studies  CBC: Recent Labs  Lab 11/02/21 1250 11/02/21 1524 11/03/21 0431  WBC 8.3  --  8.6  NEUTROABS 6.4  --   --   HGB 12.7 8.8* 8.9*  HCT 39.9 26.0* 26.7*  MCV 84.4  --  82.9  PLT 352  --  409*     Basic Metabolic Panel: Recent Labs  Lab 11/02/21 1250 11/02/21 1524 11/05/21 0839  NA 142 143 140  K 5.8* 5.0 5.5*  CL 111  --  106  CO2 16*  --  21*  GLUCOSE 101*  --  146*  BUN 37*  --  56*  CREATININE 2.73*  --  2.82*  CALCIUM 9.2  --  9.0     GFR: Estimated Creatinine Clearance: 11.2 mL/min (A) (by C-G formula based on SCr of 2.82 mg/dL (H)).  Liver Function Tests: Recent Labs  Lab 11/02/21 1250  AST 39  ALT 16  ALKPHOS 79  BILITOT 0.8  PROT 6.8  ALBUMIN 3.1*     CBG: No results for input(s): GLUCAP  in the last 168 hours.   Recent Results (from the past 240 hour(s))  Resp Panel by RT-PCR (Flu A&B, Covid) Nasopharyngeal Swab     Status: None   Collection Time: 11/02/21 11:27 AM   Specimen: Nasopharyngeal Swab; Nasopharyngeal(NP) swabs in vial transport medium   Result Value Ref Range Status   SARS Coronavirus 2 by RT PCR NEGATIVE NEGATIVE Final    Comment: (NOTE) SARS-CoV-2 target nucleic acids are NOT DETECTED.  The SARS-CoV-2 RNA is generally detectable in upper respiratory specimens during the acute phase of infection. The lowest concentration of SARS-CoV-2 viral copies this assay can detect is 138 copies/mL. A negative result does not preclude SARS-Cov-2 infection and should not be used as the sole basis for treatment or other patient management decisions. A negative result may occur with  improper specimen collection/handling, submission of specimen other than nasopharyngeal swab, presence of viral mutation(s) within the areas targeted by this assay, and inadequate number of viral copies(<138 copies/mL). A negative result must be combined with clinical observations, patient history, and epidemiological information. The expected result is Negative.  Fact Sheet for Patients:  EntrepreneurPulse.com.au  Fact Sheet for Healthcare Providers:  IncredibleEmployment.be  This test is no t yet approved or cleared by the [place] FDA and  has been authorized for detection and/or diagnosis of SARS-CoV-2 by FDA under an Emergency Use Authorization (EUA). This EUA will remain  in effect (meaning this test can be used) for the duration of the COVID-19 declaration under Section 564(b)(1) of the Act, 21 U.S.C.section 360bbb-3(b)(1), unless the authorization is terminated  or revoked sooner.       Influenza A by PCR NEGATIVE NEGATIVE Final   Influenza B by PCR NEGATIVE NEGATIVE Final    Comment: (NOTE) The Xpert Xpress SARS-CoV-2/FLU/RSV plus assay is intended as an aid in the diagnosis of influenza from Nasopharyngeal swab specimens and should not be used as a sole basis for treatment. Nasal washings and aspirates are unacceptable for Xpert Xpress SARS-CoV-2/FLU/RSV testing.  Fact Sheet for  Patients: EntrepreneurPulse.com.au  Fact Sheet for Healthcare Providers: IncredibleEmployment.be  This test is not yet approved or cleared by the [place] FDA and has been authorized for detection and/or diagnosis of SARS-CoV-2 by FDA under an Emergency Use Authorization (EUA). This EUA will remain in effect (meaning this test can be used) for the duration of the COVID-19 declaration under Section 564(b)(1) of the Act, 21 U.S.C. section 360bbb-3(b)(1), unless the authorization is terminated or revoked.  Performed at [facility], [place] [address] [place], [place] [address]   Blood Culture (routine x 2)     Status: None (Preliminary result)   Collection Time: 11/02/21 11:27 AM   Specimen: BLOOD  Result Value Ref Range Status   Specimen Description BLOOD BLOOD LEFT ARM  Final   Special Requests   Final    BOTTLES DRAWN AEROBIC AND ANAEROBIC Blood Culture adequate volume   Culture   Final    NO GROWTH 3 DAYS Performed at [facility] [address], [place], [place] [address]    Report Status PENDING  Incomplete  Blood Culture (routine x 2)     Status: None (Preliminary result)   Collection Time: 11/02/21 11:32 AM   Specimen: Left Antecubital; Blood  Result Value Ref Range Status   Specimen Description LEFT ANTECUBITAL  Final   Special Requests   Final    BOTTLES DRAWN AEROBIC AND ANAEROBIC Blood Culture adequate volume   Culture   Final  NO GROWTH 3 DAYS Performed at [facility], [place] [address] [place], [place] [address]    Report Status PENDING  Incomplete  MRSA Next Gen by PCR, Nasal     Status: None   Collection Time: 11/02/21  3:00 PM   Specimen: Nasal Mucosa; Nasal Swab  Result Value Ref Range Status   MRSA by PCR Next Gen NOT DETECTED NOT DETECTED Final    Comment: (NOTE) The GeneXpert MRSA Assay (FDA approved for NASAL specimens only), is one component of a comprehensive MRSA colonization  surveillance program. It is not intended to diagnose MRSA infection nor to guide or monitor treatment for MRSA infections. Test performance is not FDA approved in patients less than 62 years old. Performed at [facility], [place] [address], [place], [place] [address]   Urine Culture     Status: Abnormal   Collection Time: 11/02/21  8:46 PM   Specimen: In/Out Cath Urine  Result Value Ref Range Status   Specimen Description IN/OUT CATH URINE  Final   Special Requests   Final    NONE Performed at [facility], [place] [address] [place], [place] [address]    Culture 2,000 COLONIES/mL STAPHYLOCOCCUS EPIDERMIDIS (A)  Final   Report Status 11/05/2021 FINAL  Final   Organism ID, Bacteria STAPHYLOCOCCUS EPIDERMIDIS (A)  Final      Susceptibility   Staphylococcus epidermidis - MIC*    CIPROFLOXACIN <=0.5 SENSITIVE Sensitive     GENTAMICIN <=0.5 SENSITIVE Sensitive     NITROFURANTOIN <=16 SENSITIVE Sensitive     OXACILLIN >=4 RESISTANT Resistant     TETRACYCLINE <=1 SENSITIVE Sensitive     VANCOMYCIN 1 SENSITIVE Sensitive     TRIMETH/SULFA <=10 SENSITIVE Sensitive     CLINDAMYCIN >=8 RESISTANT Resistant     RIFAMPIN <=0.5 SENSITIVE Sensitive     Inducible Clindamycin NEGATIVE Sensitive     * 2,000 COLONIES/mL STAPHYLOCOCCUS EPIDERMIDIS          Radiology Studies: No results found.      Scheduled Meds:  amitriptyline  10 mg Oral Daily   amLODipine  10 mg Oral Daily   arformoterol  15 mcg Nebulization BID   aspirin EC  81 mg Oral Daily   [START ON 11/06/2021] azithromycin  500 mg Oral Once   budesonide (PULMICORT) nebulizer solution  0.25 mg Nebulization BID   carvedilol  3.125 mg Oral BID WC   clopidogrel  75 mg Oral Daily   dicyclomine  20 mg Oral TID   dronabinol  2.5 mg Oral BID AC   feeding supplement (NEPRO CARB STEADY)  237 mL Oral BID BM   ferrous sulfate  325 mg Oral Q breakfast   heparin  5,000 Units Subcutaneous Q12H   hydrALAZINE  50 mg Oral Q8H    hydrOXYzine  25 mg Oral QHS   ipratropium-albuterol  3 mL Nebulization TID   lidocaine  1 patch Transdermal Daily   magnesium oxide  400 mg Oral BID   methylPREDNISolone (SOLU-MEDROL) injection  40 mg Intravenous Q12H   multivitamin with minerals  1 tablet Oral QHS   polyethylene glycol  17 g Oral Daily   pregabalin  75 mg Oral Daily   rosuvastatin  40 mg Oral Daily   sodium bicarbonate  1,300 mg Oral TID   sodium zirconium cyclosilicate  10 g Oral Daily   umeclidinium bromide  1 puff Inhalation Daily   valproic acid  250 mg Oral QHS   Continuous Infusions:  cefTRIAXone (ROCEPHIN)  IV 2 g (11/05/21 1147)     LOS: 3 days       Oren Binet, MD [facility]   To contact the attending provider between 7A-7P or the covering provider during after hours 7P-7A, please log into the web site [email or web address] and access using universal [place] password for that web site. If you do not have the password, please call the hospital operator.  11/05/2021, 3:09 PM   Patient ID: [name], female   DOB: 03/18/1941, 81 y.o.   MRN: [id number]

## 2021-11-05 NOTE — NC FL2 (Signed)
[place] LEVEL OF CARE SCREENING TOOL     IDENTIFICATION  [name] Birthdate: 19-Jun-1941 Sex: female Admission Date (Current Location): 11/02/2021  [facility] and [place] Number:  Herbalist and Address:  [facility] [place], [place] [address], [place], [place] [address]      Provider Number: [id number]  Attending Physician Name and Address:  Jonetta Osgood, MD  Relative Name and Phone Number:       Current Level of Care: Hospital Recommended Level of Care: [place] Prior Approval Number:    Date Approved/Denied:   PASRR Number: [id number]  Discharge Plan: SNF    Current Diagnoses: Patient Active Problem List   Diagnosis Date Noted   PNA (pneumonia) 11/02/2021   Pseudoaneurysm ([address])    Pseudoaneurysm of femoral artery following procedure ([place]) 10/22/2021   Flank pain 26/20/3559   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, implant, and graft 09/29/2021   Bradycardia with 31-40 beats per minute 09/26/2021   Hypertensive crisis 09/10/2021   Rectal bleeding    Rectal pain    Malnutrition of moderate degree 06/17/2021   Weight loss 06/16/2021   Zoster 06/16/2021   Collapsed lung    COPD exacerbation ([place]) 09/26/2020   Cigarette smoker 09/26/2020   Shortness of breath 07/06/2020   Essential hypertension 07/06/2020   Abdominal pain 07/06/2020   Elevated INR 07/06/2020   Acute respiratory failure due to COVID-19 ([place]) 06/02/2020   Acute renal failure superimposed on chronic kidney disease ([place]) 02/06/2020   Leukocytosis 02/06/2020   Thrombocytosis 02/06/2020   Palliative care encounter    Constipation 01/05/2020   Protein-calorie malnutrition, severe ([place]) 01/04/2020   Acute on chronic renal failure ([place]) 01/04/2020   Hyperkalemia 01/03/2020   AKI (acute kidney injury) ([place]) 01/03/2020   Acute respiratory failure with hypoxia ([place]) 11/01/2019   UTI (urinary tract infection)  11/01/2019   Community acquired pneumonia 11/01/2019   Acute cystitis without hematuria    Encephalopathy 09/18/2019   SAH (subarachnoid hemorrhage) ([place]) 09/18/2019   Dementia ([place]) 74/16/3845   Metabolic acidosis 36/46/8032   ARF (acute renal failure) ([place]) 09/17/2019   Acute encephalopathy 09/17/2019   Abnormal CT scan, colon    Diarrhea    Diverticulitis 06/07/2019   Depression with anxiety 06/07/2019   DNR (do not resuscitate) 03/23/2019   Volume overload 03/23/2019   Edema 03/23/2019   Acute renal failure (ARF) ([place]) 03/08/2019   Hypokalemia 03/08/2019   Palliative care by specialist    Goals of care, counseling/discussion    Altered mental status    Encephalopathy, toxic 12/24/2018   Aspiration pneumonia of both lower lobes due to gastric secretions ([place]) 12/24/2018   Non-ST elevation (NSTEMI) myocardial infarction [facility])    Chest pain    Dyspnea on exertion    Elevated troponin level 12/09/2018   Chronic migraine 03/27/2018   Lumbar radiculopathy 03/27/2018   Gait abnormality 03/27/2018   Hypertensive urgency 12/28/2016   CKD (chronic kidney disease) stage 4, GFR 15-29 ml/min (HCC) 12/28/2016   Anemia 12/28/2016   Chronic pain syndrome 12/28/2016   Benzodiazepine withdrawal without complication ([place]) 10/10/8249   Chronic right shoulder pain 12/06/2016   Fall 09/17/2016   Fracture of humeral head, closed, right, initial encounter 09/17/2016   Rib fractures 09/17/2016   Multiple falls 09/17/2016   Severe muscle deconditioning 09/17/2016   Failure to thrive in adult 09/17/2016   Melena 04/25/2016   Transient hypotension 04/25/2016   Anxiety state 04/25/2016  Tobacco abuse 04/25/2016   Hyperlipidemia 04/25/2016   Syncope 04/24/2016   Near syncope 04/24/2016   Gait difficulty 01/12/2016   Foot drop, left 01/12/2016   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   Spondylolisthesis at L4-L5 level 07/30/2015   PVD (peripheral  vascular disease) ([place]) 07/29/2014   Weakness-Bilateral arm/leg 07/29/2014   Numbness-left leg 07/29/2014   Swelling of limb-Legs 07/29/2014   Atherosclerosis of native arteries of the extremities with intermittent claudication 09/30/2011    Orientation RESPIRATION BLADDER Height & Weight     Self, Time, Situation, Place  O2 (Nasal cannula 1L) Incontinent, External catheter Weight: 95 lb (43.1 kg) Height:  5\' 3"  (160 cm)  BEHAVIORAL SYMPTOMS/MOOD NEUROLOGICAL BOWEL NUTRITION STATUS      Incontinent Diet (see dc summary)  AMBULATORY STATUS COMMUNICATION OF NEEDS Skin   Limited Assist Verbally Normal                       Personal Care Assistance Level of Assistance  Bathing, Feeding, Dressing Bathing Assistance: Maximum assistance Feeding assistance: Limited assistance Dressing Assistance: Limited assistance     Functional Limitations Info             SPECIAL CARE FACTORS FREQUENCY  PT (By licensed PT), OT (By licensed OT)     PT Frequency: 5x/week OT Frequency: 5x/week            Contractures Contractures Info: Not present    Additional Factors Info  Code Status, Allergies Code Status Info: DNR Allergies Info: NKA           Current Medications (11/05/2021):  This is the current hospital active medication list Current Facility-Administered Medications  Medication Dose Route Frequency Provider Last Rate Last Admin   acetaminophen (TYLENOL) tablet 650 mg  650 mg Oral Q6H PRN Wynetta Fines T, MD       Or   acetaminophen (TYLENOL) suppository 650 mg  650 mg Rectal Q6H PRN Wynetta Fines T, MD       albuterol (PROVENTIL) (2.5 MG/3ML) 0.083% nebulizer solution 2.5 mg  2.5 mg Inhalation Q4H PRN Wynetta Fines T, MD       amitriptyline (ELAVIL) tablet 10 mg  10 mg Oral Daily Wynetta Fines T, MD   10 mg at 11/05/21 0901   amLODipine (NORVASC) tablet 10 mg  10 mg Oral Daily Wynetta Fines T, MD   10 mg at 11/05/21 0901   arformoterol (BROVANA) nebulizer solution 15 mcg  15  mcg Nebulization BID Jonetta Osgood, MD   15 mcg at 11/05/21 0847   aspirin EC tablet 81 mg  81 mg Oral Daily Wynetta Fines T, MD   81 mg at 11/05/21 0901   [START ON 11/06/2021] azithromycin (ZITHROMAX) tablet 500 mg  500 mg Oral Once Pham, Minh Q, RPH-CPP       budesonide (PULMICORT) nebulizer solution 0.25 mg  0.25 mg Nebulization BID Jonetta Osgood, MD   0.25 mg at 11/05/21 0847   carvedilol (COREG) tablet 3.125 mg  3.125 mg Oral BID WC Wynetta Fines T, MD   3.125 mg at 11/05/21 1700   cefTRIAXone (ROCEPHIN) 2 g in sodium chloride 0.9 % 100 mL IVPB  2 g Intravenous Q24H Isla Pence, MD 200 mL/hr at 11/05/21 1147 2 g at 11/05/21 1147   clopidogrel (PLAVIX) tablet 75 mg  75 mg Oral Daily Wynetta Fines T, MD   75 mg at 11/05/21 0900   diclofenac Sodium (VOLTAREN) 1 %  topical gel 2 g  2 g Topical Q6H PRN Wynetta Fines T, MD       dicyclomine (BENTYL) tablet 20 mg  20 mg Oral TID Wynetta Fines T, MD   20 mg at 11/05/21 1700   dronabinol (MARINOL) capsule 2.5 mg  2.5 mg Oral BID AC Wynetta Fines T, MD   2.5 mg at 11/05/21 1700   feeding supplement (NEPRO CARB STEADY) liquid 237 mL  237 mL Oral BID BM Jonetta Osgood, MD   237 mL at 11/05/21 1440   ferrous sulfate tablet 325 mg  325 mg Oral Q breakfast Wynetta Fines T, MD   325 mg at 11/05/21 0858   heparin injection 5,000 Units  5,000 Units Subcutaneous Q12H Wynetta Fines T, MD   5,000 Units at 11/05/21 4401   hydrALAZINE (APRESOLINE) tablet 50 mg  50 mg Oral Q8H Wynetta Fines T, MD   50 mg at 11/05/21 1440   HYDROmorphone (DILAUDID) tablet 1-2 mg  1-2 mg Oral Q4H PRN Lin Landsman, NP   2 mg at 11/04/21 1630   hydrOXYzine (ATARAX) tablet 25 mg  25 mg Oral QHS Wynetta Fines T, MD   25 mg at 11/04/21 2143   ipratropium-albuterol (DUONEB) 0.5-2.5 (3) MG/3ML nebulizer solution 3 mL  3 mL Nebulization TID Jonetta Osgood, MD   3 mL at 11/05/21 1517   lidocaine (LIDODERM) 5 % 1 patch  1 patch Transdermal Daily Wynetta Fines T, MD   1 patch at 11/05/21 0901    LORazepam (ATIVAN) tablet 0.5 mg  0.5 mg Oral Q8H PRN Wynetta Fines T, MD       magnesium oxide (MAG-OX) tablet 400 mg  400 mg Oral BID Wynetta Fines T, MD   400 mg at 11/05/21 0902   multivitamin with minerals tablet 1 tablet  1 tablet Oral QHS Wynetta Fines T, MD   1 tablet at 11/04/21 2143   ondansetron (ZOFRAN) tablet 4 mg  4 mg Oral Q6H PRN Wynetta Fines T, MD       Or   ondansetron [name]) injection 4 mg  4 mg Intravenous Q6H PRN Wynetta Fines T, MD       polyethylene glycol (MIRALAX / GLYCOLAX) packet 17 g  17 g Oral Daily Wynetta Fines T, MD   17 g at 11/05/21 0900   [START ON 11/06/2021] predniSONE (DELTASONE) tablet 40 mg  40 mg Oral Q breakfast Ghimire, Henreitta Leber, MD       pregabalin (LYRICA) capsule 75 mg  75 mg Oral Daily Wynetta Fines T, MD   75 mg at 11/05/21 0901   rosuvastatin (CRESTOR) tablet 40 mg  40 mg Oral Daily Wynetta Fines T, MD   40 mg at 11/05/21 0901   sodium bicarbonate tablet 1,300 mg  1,300 mg Oral TID Wynetta Fines T, MD   1,300 mg at 11/05/21 1700   sodium zirconium cyclosilicate (LOKELMA) packet 10 g  10 g Oral BID Jonetta Osgood, MD       umeclidinium bromide (INCRUSE ELLIPTA) 62.5 MCG/ACT 1 puff  1 puff Inhalation Daily Wynetta Fines T, MD   1 puff at 11/05/21 0272   valproic acid (DEPAKENE) 250 MG capsule 250 mg  250 mg Oral QHS Lequita Halt, MD   250 mg at 11/04/21 2147     Discharge Medications: Please see discharge summary for a list of discharge medications.  Relevant Imaging Results:  Relevant Lab Results:   Additional Information ss#[id number]. COVID vaccinations: Pfizer 2/23,  3/16, 10/12/20  Benard Halsted, LCSW

## 2021-11-05 NOTE — Progress Notes (Signed)
PHARMACIST - PHYSICIAN COMMUNICATION DR:   Sloan Leiter CONCERNING: Antibiotic IV to Oral Route Change Policy  RECOMMENDATION: This patient is receiving zithromax by the intravenous route.  Based on criteria approved by the Pharmacy and Therapeutics Committee, the antibiotic(s) is/are being converted to the equivalent oral dose form(s).   DESCRIPTION: These criteria include: Patient being treated for a respiratory tract infection, urinary tract infection, cellulitis or clostridium difficile associated diarrhea if on metronidazole The patient is not neutropenic and does not exhibit a GI malabsorption state The patient is eating (either orally or via tube) and/or has been taking other orally administered medications for a least 24 hours The patient is improving clinically and has a Tmax < 100.5  If you have questions about this conversion, please contact the Pharmacy Department  []   [phone] )  [facility] []   [phone] )  [facility] [x]   [phone] )  [facility] []   [phone] )  [facility] []   [phone] )  [facility]

## 2021-11-05 NOTE — Evaluation (Signed)
Occupational Therapy Evaluation Patient Details Name: Alyssa [name] MRN: [id number] DOB: [birth year] Today's Date: 11/05/2021   History of Present Illness pt is a 81 y/o female admitted 1/16 with increasing SOB and worsening weakness.  Work up includes COPD exacerbation, protein calorie malnutrition and CKD.  PMH includes HTN; HLD; PVD; dementia; and CKD.   Clinical Impression   Pt presents with decline in function and safety with ADLs and ADL mobility with impaired strength, balance and endurance. Pt with a recent d/c from this hospital. PTA pt lived at home with her grandson and was  Ind with ADLs, used a RW for mobility, children provided meal, home mgt and transportation. Pt currently requires min A to sit EOB, mod - min A for mobility HHA, mod A min A with UB ADLs, max A with LB ADLs, mod A with toileting. Pt would benefit from acute OT services to address impairments to maximize level of function and safety     Recommendations for follow up therapy are one component of a multi-disciplinary discharge planning process, led by the attending physician.  Recommendations may be updated based on patient status, additional functional criteria and insurance authorization.   Follow Up Recommendations  Home health OT (may need SNF if unabe to have 24 hr  assist at home)    Assistance Recommended at Discharge    Patient can return home with the following Assistance with cooking/housework;Direct supervision/assist for medications management;Direct supervision/assist for financial management;Assist for transportation;Help with stairs or ramp for entrance;A lot of help with bathing/dressing/bathroom;A lot of help with walking and/or transfers    Functional Status Assessment  Patient has had a recent decline in their functional status and demonstrates the ability to make significant improvements in function in a reasonable and predictable amount of time.  Equipment Recommendations  Other  (comment) (hospital bed)    Recommendations for Other Services       Precautions / Restrictions Precautions Precautions: Fall Restrictions Weight Bearing Restrictions: No      Mobility Bed Mobility Overal bed mobility: Needs Assistance Bed Mobility: Supine to Sit, Sit to Supine     Supine to sit: Min assist Sit to supine: Mod assist   General bed mobility comments: increased time and effor tto sit EOB, min A to elevate trunk, mod A with LEs back onto bed    Transfers Overall transfer level: Needs assistance Equipment used: 1 person hand held assist Transfers: Sit to/from Stand Sit to Stand: Mod assist, Min assist           General transfer comment: mod A to pwer up from EOB, min A form BSC. Pt able side step x 3 steps along EOB to come closer to [place] Overall balance assessment: Needs assistance Sitting-balance support: No upper extremity supported, Single extremity supported, Feet supported Sitting balance-Leahy Scale: Fair     Standing balance support: Single extremity supported, Bilateral upper extremity supported Standing balance-Leahy Scale: Poor                             ADL either performed or assessed with clinical judgement   ADL Overall ADL's : Needs assistance/impaired Eating/Feeding: Set up;Sitting;Bed level   Grooming: Wash/dry hands;Wash/dry face;Min guard;Sitting   Upper Body Bathing: Minimal assistance;Sitting   Lower Body Bathing: Maximal assistance;Sitting/lateral leans   Upper Body Dressing : Minimal assistance;Sitting   Lower Body Dressing: Maximal assistance   Toilet Transfer: Moderate assistance;Minimal assistance;BSC/3in1;Cueing  for safety;Cueing for sequencing   Toileting- Clothing Manipulation and Hygiene: Moderate assistance       Functional mobility during ADLs: Moderate assistance;Minimal assistance;Cueing for safety;Cueing for sequencing       Vision Baseline Vision/History: 0 No visual  deficits Ability to See in Adequate Light: 0 Adequate Patient Visual Report: No change from baseline       Perception     Praxis      Pertinent Vitals/Pain Pain Assessment Pain Assessment: No/denies pain Pain Score: 0-No pain Pain Intervention(s): Monitored during session, Repositioned     Hand Dominance Right   Extremity/Trunk Assessment Upper Extremity Assessment Upper Extremity Assessment: Generalized weakness   Lower Extremity Assessment Lower Extremity Assessment: Defer to PT evaluation   Cervical / Trunk Assessment Cervical / Trunk Assessment: Kyphotic   Communication Communication Communication: No difficulties   Cognition   Behavior During Therapy: WFL for tasks assessed/performed Overall Cognitive Status: No family/caregiver present to determine baseline cognitive functioning                                       General Comments       Exercises     Shoulder Instructions      Home Living Family/patient expects to be discharged to:: Private residence Living Arrangements: Children Available Help at Discharge: Family;Friend(s);Personal care attendant Type of Home: House Home Access: Stairs to enter [employer] of Steps: 4 Entrance Stairs-Rails: Can reach both;Left;Right Home Layout: One level     Bathroom Shower/Tub: Tub/shower unit;Walk-in shower   Bathroom Toilet: Handicapped height Bathroom Accessibility: Yes   Home Equipment: Conservation officer, nature (2 wheels);Rollator (4 wheels);Tub bench;BSC/3in1   Additional Comments: 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      Prior Functioning/Environment Prior Level of Function : Needs assist       Physical Assist : Mobility (physical);ADLs (physical)     Mobility Comments: Generally walks in the home with Mod I and 4WRW ADLs Comments: Able to complete her own self care, son cooks and drives, sister performs home management and helps with  medications. she also has an aide that comes 2 hours a day        OT Problem List: Decreased strength;Decreased activity tolerance;Pain;Impaired balance (sitting and/or standing)      OT Treatment/Interventions:      OT Goals(Current goals can be found in the care plan section) Acute Rehab OT Goals Patient Stated Goal: go home OT Goal Formulation: With patient Time For Goal Achievement: 11/19/21 Potential to Achieve Goals: Good ADL Goals Pt Will Perform Grooming: with supervision;with set-up;sitting Pt Will Perform Upper Body Bathing: with min guard assist;with supervision;sitting Pt Will Perform Lower Body Bathing: with mod assist;with min assist;sitting/lateral leans Pt Will Perform Upper Body Dressing: with min guard assist;with supervision;sitting Pt Will Transfer to Toilet: with min assist;with min guard assist;ambulating;stand pivot transfer Pt Will Perform Toileting - Clothing Manipulation and hygiene: with min assist;with min guard assist;sit to/from stand  OT Frequency:      Co-evaluation              AM-PAC OT "6 Clicks" Daily Activity     Outcome Measure Help from another person eating meals?: None Help from another person taking care of personal grooming?: A Little Help from another person toileting, which includes using toliet, bedpan, or urinal?: A Lot Help from another person bathing (including washing, rinsing, drying)?:  A Lot Help from another person to put on and taking off regular upper body clothing?: A Little Help from another person to put on and taking off regular lower body clothing?: A Lot 6 Click Score: 16   End of Session Equipment Utilized During Treatment: Gait belt;Other (comment) (BSC)  Activity Tolerance: Patient limited by fatigue Patient left: in bed;with bed alarm set;with call bell/phone within reach  OT Visit Diagnosis: Unsteadiness on feet (R26.81);Muscle weakness (generalized) (M62.81);Pain;Other abnormalities of gait and mobility  (R26.89)                Time: 6659-9357 OT Time Calculation (min): 24 min Charges:  OT General Charges $OT Visit: 1 Visit OT Evaluation $OT Eval Moderate Complexity: 1 Mod OT Treatments $Therapeutic Activity: 8-22 mins   Britt Bottom 11/05/2021, 3:48 PM

## 2021-11-05 NOTE — Care Management Important Message (Signed)
Important Message  Patient Details  Name: Alyssa [name] MRN: [id number] Date of Birth: 20-Feb-1941   Medicare Important Message Given:  Yes     Orbie Pyo 11/05/2021, 2:21 PM

## 2021-11-06 LAB — POTASSIUM: Potassium: 5.6 mmol/L — ABNORMAL HIGH (ref 3.5–5.1)

## 2021-11-06 MED ORDER — PREDNISONE 5 MG PO TABS
30.0000 mg | ORAL_TABLET | Freq: Every day | ORAL | Status: DC
  Administered 2021-11-07 – 2021-11-08 (×2): 30 mg via ORAL
  Filled 2021-11-06 (×2): qty 2

## 2021-11-06 NOTE — Progress Notes (Signed)
Occupational Therapy Treatment Patient Details Name: Alyssa [name] MRN: [id number] DOB: 1941/09/10 Today's Date: 11/06/2021   History of present illness pt is a 81 y/o female admitted 1/16 with increasing SOB and worsening weakness.  Work up includes COPD exacerbation, protein calorie malnutrition and CKD.  PMH includes HTN; HLD; PVD; dementia; and CKD.   OT comments  Pt making slow progress with functional goals. Pt's sister present this afternoon. Pt able to sit EOB for UE ROM, donning gown and washing face/hands, stood at EOB for SPTs and to side step to [facility]. Pt fatigues easily and requires multiple rest breaks. OT will continue to follow acutely to maximize level of function and safety   Recommendations for follow up therapy are one component of a multi-disciplinary discharge planning process, led by the attending physician.  Recommendations may be updated based on patient status, additional functional criteria and insurance authorization.    Follow Up Recommendations  Home health OT (may need SNF if unabe to have 24 hr  assist at home)    Assistance Recommended at Discharge Intermittent Supervision/Assistance  Patient can return home with the following  Assistance with cooking/housework;Direct supervision/assist for medications management;Direct supervision/assist for financial management;Assist for transportation;Help with stairs or ramp for entrance;A lot of help with bathing/dressing/bathroom;A lot of help with walking and/or transfers   Equipment Recommendations  Other (comment) (hospital bed)    Recommendations for Other Services      Precautions / Restrictions Precautions Precautions: Fall Restrictions Weight Bearing Restrictions: No       Mobility Bed Mobility Overal bed mobility: Needs Assistance Bed Mobility: Supine to Sit, Sit to Supine           General bed mobility comments: increased time and effor tto sit EOB, min A to elevate trunk, mod A with LEs  back onto bed    Transfers Overall transfer level: Needs assistance Equipment used: 1 person hand held assist   Sit to Stand: Mod assist, Min assist           General transfer comment: mod A to pwer up from EOB, min A form BSC. Pt able side step x 3 steps along EOB to come closer to [place] Overall balance assessment: Needs assistance Sitting-balance support: No upper extremity supported, Single extremity supported, Feet supported Sitting balance-Leahy Scale: Fair Sitting balance - Comments: pt could maintain static sitting and reach to B LEs   Standing balance support: Single extremity supported, Bilateral upper extremity supported Standing balance-Leahy Scale: Poor                             ADL either performed or assessed with clinical judgement   ADL Overall ADL's : Needs assistance/impaired     Grooming: Wash/dry hands;Wash/dry face;Min guard;Sitting           Upper Body Dressing : Min guard;Sitting   Lower Body Dressing: Moderate assistance Lower Body Dressing Details (indicate cue type and reason): able to doff/don socks sitting EOB, primarily bending to feet. did fatigue with task but improved with rest break Toilet Transfer: Moderate assistance;Minimal assistance;BSC/3in1;Cueing for safety;Cueing for sequencing   Toileting- Clothing Manipulation and Hygiene: Moderate assistance       Functional mobility during ADLs: Moderate assistance;Minimal assistance;Cueing for safety;Cueing for sequencing      Extremity/Trunk Assessment Upper Extremity Assessment Upper Extremity Assessment: Generalized weakness   Lower Extremity Assessment Lower Extremity Assessment: Defer to PT evaluation  Cervical / Trunk Assessment Cervical / Trunk Assessment: Kyphotic    Vision Baseline Vision/History: 0 No visual deficits Ability to See in Adequate Light: 0 Adequate Patient Visual Report: No change from baseline     Perception     Praxis       Cognition Arousal/Alertness: Awake/alert Behavior During Therapy: WFL for tasks assessed/performed Overall Cognitive Status: No family/caregiver present to determine baseline cognitive functioning                                          Exercises      Shoulder Instructions       General Comments      Pertinent Vitals/ Pain       Pain Assessment Pain Assessment: Faces Faces Pain Scale: Hurts a little bit Pain Location: back Pain Descriptors / Indicators: Aching, Dull Pain Intervention(s): Monitored during session, Repositioned  Home Living                                          Prior Functioning/Environment              Frequency  Min 2X/week        Progress Toward Goals  OT Goals(current goals can now be found in the care plan section)  Progress towards OT goals: Progressing toward goals     Plan      Co-evaluation                 AM-PAC OT "6 Clicks" Daily Activity     Outcome Measure   Help from another person eating meals?: None Help from another person taking care of personal grooming?: A Little Help from another person toileting, which includes using toliet, bedpan, or urinal?: A Lot Help from another person bathing (including washing, rinsing, drying)?: A Lot Help from another person to put on and taking off regular upper body clothing?: A Little Help from another person to put on and taking off regular lower body clothing?: A Lot 6 Click Score: 16    End of Session Equipment Utilized During Treatment: Gait belt;Other (comment) (BSC)  OT Visit Diagnosis: Unsteadiness on feet (R26.81);Muscle weakness (generalized) (M62.81);Pain;Other abnormalities of gait and mobility (R26.89) Pain - part of body:  (back)   Activity Tolerance Patient limited by fatigue   Patient Left in bed;with bed alarm set;with call bell/phone within reach;with family/visitor present   Nurse Communication           Time: 4098-1191 OT Time Calculation (min): 19 min  Charges: OT General Charges $OT Visit: 1 Visit OT Treatments $Therapeutic Activity: 8-22 mins    Britt Bottom 11/06/2021, 2:46 PM

## 2021-11-06 NOTE — TOC Progression Note (Signed)
Transition of Care [facility]) - Progression Note    Patient Details  Name: Alyssa [name] MRN: [id number] Date of Birth: 03/26/1941  Transition of Care [facility]) CM/SW [place], LCSW Phone Number: 11/06/2021, 5:44 PM  Clinical Narrative:    CSW spoke with patient's son, [name]. He stated he spoke with patient and she is still not wanting to go to SNF but that he is speaking with his family to see what needs to be done. CSW provided SNF bed offers and made him aware that hospice would be need to be revoked and patient would need to be able to participate in therapy to go to SNF. He will have a decision by tomorrow. Weekend CSW to follow up.    Expected Discharge Plan: [place] Barriers to Discharge: Continued Medical Work up  Expected Discharge Plan and Services Expected Discharge Plan: [place]       Living arrangements for the past 2 months: Single Family Home                                       Social Determinants of Health (SDOH) Interventions    Readmission Risk Interventions Readmission Risk Prevention Plan 11/06/2021 10/09/2021 07/10/2020  Transportation Screening Complete Complete Complete  Medication Review Press photographer) Complete Complete Complete  PCP or Specialist appointment within 3-5 days of discharge Complete Complete -  [place] or Home Care Consult Complete Complete Complete  SW Recovery Care/Counseling Consult Complete Complete Complete  Palliative Care Screening Complete Not Applicable Not [place] Complete Not Applicable Not Applicable  Some recent data might be hidden

## 2021-11-06 NOTE — Progress Notes (Addendum)
Physical Therapy Treatment Patient Details Name: Alyssa [name] MRN: [id number] DOB: 04-05-1941 Today's Date: 11/06/2021   History of Present Illness pt is a 81 y/o female admitted 1/16 with increasing SOB and worsening weakness.  Work up includes COPD exacerbation, protein calorie malnutrition and CKD.  PMH includes HTN; HLD; PVD; dementia; and CKD.    PT Comments    Pt making steady progress with mobility. Able to amb short distance with min assist. Pt doesn't have her built up shoe that she wears on her RLE. If family can provide assist with mobility she could return home. If not may need SNF.    Recommendations for follow up therapy are one component of a multi-disciplinary discharge planning process, led by the attending physician.  Recommendations may be updated based on patient status, additional functional criteria and insurance authorization.  Follow Up Recommendations  Home health PT(If family can provide support at home)     Assistance Recommended at Discharge Frequent or constant Supervision/Assistance  Patient can return home with the following A little help with walking and/or transfers;Help with stairs or ramp for entrance   Equipment Recommendations  None recommended by PT    Recommendations for Other Services       Precautions / Restrictions Precautions Precautions: Fall Restrictions Weight Bearing Restrictions: No     Mobility  Bed Mobility Overal bed mobility: Needs Assistance Bed Mobility: Supine to Sit, Sit to Supine     Supine to sit: Min guard, HOB elevated Sit to supine: Mod assist   General bed mobility comments: Assist to bring legs back up into the bed returning to supine    Transfers Overall transfer level: Needs assistance Equipment used: Rollator (4 wheels) Transfers: Sit to/from Stand Sit to Stand: Min assist           General transfer comment: Assist for balance    Ambulation/Gait Ambulation/Gait assistance: Min  assist Gait Distance (Feet): 25 Feet Assistive device: Rollator (4 wheels) Gait Pattern/deviations: Step-through pattern, Decreased stride length Gait velocity: decr Gait velocity interpretation: <1.31 ft/sec, indicative of household ambulator   General Gait Details: Pt with trendelenburg like gait due to short rt leg and pt doesn't have built up shoe present.   Stairs             Wheelchair Mobility    Modified Rankin (Stroke Patients Only)       Balance Overall balance assessment: Needs assistance Sitting-balance support: No upper extremity supported, Feet supported Sitting balance-Leahy Scale: Fair     Standing balance support: Bilateral upper extremity supported Standing balance-Leahy Scale: Poor Standing balance comment: rollator and min guard                            Cognition Arousal/Alertness: Awake/alert Behavior During Therapy: WFL for tasks assessed/performed Overall Cognitive Status: No family/caregiver present to determine baseline cognitive functioning                                          Exercises      General Comments        Pertinent Vitals/Pain Pain Assessment Pain Assessment: No/denies pain    Home Living                          Prior Function  PT Goals (current goals can now be found in the care plan section) Acute Rehab PT Goals Patient Stated Goal: go home Progress towards PT goals: Progressing toward goals    Frequency           PT Plan Current plan remains appropriate    Co-evaluation              AM-PAC PT "6 Clicks" Mobility   Outcome Measure  Help needed turning from your back to your side while in a flat bed without using bedrails?: A Little Help needed moving from lying on your back to sitting on the side of a flat bed without using bedrails?: A Little Help needed moving to and from a bed to a chair (including a wheelchair)?: A Little Help  needed standing up from a chair using your arms (e.g., wheelchair or bedside chair)?: A Little Help needed to walk in hospital room?: A Little Help needed climbing 3-5 steps with a railing? : A Lot 6 Click Score: 17    End of Session   Activity Tolerance: Patient tolerated treatment well Patient left: in bed;with call bell/phone within reach;with nursing/sitter in room;with bed alarm set   PT Visit Diagnosis: Other abnormalities of gait and mobility (R26.89);Muscle weakness (generalized) (M62.81);Difficulty in walking, not elsewhere classified (R26.2)     Time: 7989-2119 PT Time Calculation (min) (ACUTE ONLY): 10 min  Charges:  $Gait Training: 8-22 mins                     [place] Pager [phone] Office [place] 11/06/2021, 5:06 PM

## 2021-11-06 NOTE — Progress Notes (Signed)
PROGRESS NOTE    Alyssa [name]  BWI:[id number] DOB: 15-Nov-1940 DOA: 11/02/2021 PCP: Pcp, No   Chief Complaint  Patient presents with   Shortness of Breath    Brief Narrative:  Alyssa [name] is a 81 y.o. 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  Subjective: No major issues overnight-still wheezing but overall better.  Lying comfortably in bed.  Objective: Vitals: Blood pressure (!) 136/106, pulse 84, temperature (!) 97.4 F (36.3 C), temperature source Axillary, resp. rate 15, height 5\' 3"  (1.6 m), weight 43.1 kg, SpO2 96 %.   Exam: Gen Exam:Alert awake-not in any distress HEENT:atraumatic, normocephalic Chest: Moving air well-some scattered rhonchi. CVS:S1S2 regular Abdomen:soft non tender, non distended Extremities:no edema Neurology: Non focal Skin: no rash    Assessment & Plan: COPD exacerbation: Improved-minimal wheezing-completed a course of antibiotics-steroids being tapered down-continue bronchodilators.    Hyperkalemia: Continues to have mild hyperkalemia-will get her first twice daily dosing of Lokelma today-recheck electrolytes tomorrow.    Right groin pseudoaneurysm: Evaluated by vascular surgery during her most recent hospitalization-not a candidate for surgical repair.  Continue supportive care-palliative care evaluation appreciated-Home hospice on discharge.     PAD: Continue aspirin/Plavix/statin-given poor overall state of health/frailty-do not think any further intervention is warranted/indicated.  Stage IV CKD: Renal function stable and close to baseline-has been deemed not a HD candidate per prior notes.  Continues to have intermittent hyperkalemia-on Lokelma.  Normocytic anemia: Due to CKD-no indication for transfusion.   HTN: BP stable-continue Norvasc,  hydralazine and Coreg.   HLD: Continue Crestor  Dementia: Appears mild-continue Depakote/Vistaril and amitriptyline.  Maintain delirium precautions.   Chronic pain: Continue Lyrica and as needed narcotics/Robaxin.    Palliative care: DNR already in place-appears very frail-clearly not a candidate for aggressive care.  Per vascular surgery not a candidate for repair of pseudoaneurysm of her right groin.  Per prior notes-not a candidate for HD.  Per social worker/case manager-family requesting SNF placement.   DVT prophylaxis: Heparin Code Status: DNR Family Communication: None at bedside Disposition:   Status is: Inpatient  Remains inpatient appropriate because: Slowly resolving COPD exacerbation-family requesting SNF placement-insurance authorization/SNF bed placement in progress.      Consultants:  Palliative medicine S   Data Reviewed: I have personally reviewed following labs and imaging studies  CBC: Recent Labs  Lab 11/02/21 1250 11/02/21 1524 11/03/21 0431  WBC 8.3  --  8.6  NEUTROABS 6.4  --   --   HGB 12.7 8.8* 8.9*  HCT 39.9 26.0* 26.7*  MCV 84.4  --  82.9  PLT 352  --  409*     Basic Metabolic Panel: Recent Labs  Lab 11/02/21 1250 11/02/21 1524 11/05/21 0839 11/06/21 0111  NA 142 143 140  --   K 5.8* 5.0 5.5* 5.6*  CL 111  --  106  --   CO2 16*  --  21*  --   GLUCOSE 101*  --  146*  --   BUN 37*  --  56*  --   CREATININE 2.73*  --  2.82*  --   CALCIUM 9.2  --  9.0  --      GFR: Estimated Creatinine Clearance: 11.2 mL/min (A) (by C-G formula based on SCr of 2.82 mg/dL (H)).  Liver Function Tests: Recent Labs  Lab 11/02/21 1250  AST 39  ALT 16  ALKPHOS 79  BILITOT 0.8  PROT 6.8  ALBUMIN 3.1*     CBG: No results for input(s): GLUCAP in the last 168 hours.   Recent Results (from the past 240 hour(s))  Resp Panel by RT-PCR (Flu A&B, Covid) Nasopharyngeal Swab     Status: None   Collection Time: 11/02/21 11:27 AM   Specimen:  Nasopharyngeal Swab; Nasopharyngeal(NP) swabs in vial transport medium  Result Value Ref Range Status   SARS Coronavirus 2 by RT PCR NEGATIVE NEGATIVE Final    Comment: (NOTE) SARS-CoV-2 target nucleic acids are NOT DETECTED.  The SARS-CoV-2 RNA is generally detectable in upper respiratory specimens during the acute phase of infection. The lowest concentration of SARS-CoV-2 viral copies this assay can detect is 138 copies/mL. A negative result does not preclude SARS-Cov-2 infection and should not be used as the sole basis for treatment or other patient management decisions. A negative result may occur with  improper specimen collection/handling, submission of specimen other than nasopharyngeal swab, presence of viral mutation(s) within the areas targeted by this assay, and inadequate number of viral copies(<138 copies/mL). A negative result must be combined with clinical observations, patient history, and epidemiological information. The expected result is Negative.  Fact Sheet for Patients:  EntrepreneurPulse.com.au  Fact Sheet for Healthcare Providers:  IncredibleEmployment.be  This test is no t yet approved or cleared by the [place] FDA and  has been authorized for detection and/or diagnosis of SARS-CoV-2 by FDA under an Emergency Use Authorization (EUA). This EUA will remain  in effect (meaning this test can be used) for the duration of the COVID-19 declaration under Section 564(b)(1) of the Act, 21 U.S.C.section 360bbb-3(b)(1), unless the authorization is terminated  or revoked sooner.       Influenza A by PCR NEGATIVE NEGATIVE Final   Influenza B by PCR NEGATIVE NEGATIVE Final    Comment: (NOTE) The Xpert Xpress SARS-CoV-2/FLU/RSV plus assay is intended as an aid in the diagnosis of influenza from Nasopharyngeal swab specimens and should not be used as a sole basis for treatment. Nasal washings and aspirates are unacceptable for  Xpert Xpress SARS-CoV-2/FLU/RSV testing.  Fact Sheet for Patients: EntrepreneurPulse.com.au  Fact Sheet for Healthcare Providers: IncredibleEmployment.be  This test is not yet approved or cleared by the [place] FDA and has been authorized for detection and/or diagnosis of SARS-CoV-2 by FDA under an Emergency Use Authorization (EUA). This EUA will remain in effect (meaning this test can be used) for the duration of the COVID-19 declaration under Section 564(b)(1) of the Act, 21 U.S.C. section 360bbb-3(b)(1), unless the authorization is terminated or revoked.  Performed at [facility], [place] [address] [place], [place] [address]   Blood Culture (routine x 2)     Status: None (Preliminary result)   Collection Time: 11/02/21 11:27 AM   Specimen: BLOOD  Result Value Ref Range Status   Specimen Description BLOOD BLOOD LEFT ARM  Final   Special Requests   Final    BOTTLES DRAWN AEROBIC AND ANAEROBIC Blood Culture adequate volume   Culture   Final    NO GROWTH 4 DAYS Performed at [facility], [place] [address] [place], [place] [address]    Report Status PENDING  Incomplete  Blood Culture (routine x 2)     Status: None (Preliminary result)   Collection Time: 11/02/21 11:32 AM   Specimen: Left Antecubital; Blood  Result Value Ref Range Status   Specimen Description LEFT ANTECUBITAL  Final  Special Requests   Final    BOTTLES DRAWN AEROBIC AND ANAEROBIC Blood Culture adequate volume   Culture   Final    NO GROWTH 4 DAYS Performed at [facility], [place] [address], [place], [place] [address]    Report Status PENDING  Incomplete  MRSA Next Gen by PCR, Nasal     Status: None   Collection Time: 11/02/21  3:00 PM   Specimen: Nasal Mucosa; Nasal Swab  Result Value Ref Range Status   MRSA by PCR Next Gen NOT DETECTED NOT DETECTED Final    Comment: (NOTE) The GeneXpert MRSA Assay (FDA approved for NASAL specimens only), is one  component of a comprehensive MRSA colonization surveillance program. It is not intended to diagnose MRSA infection nor to guide or monitor treatment for MRSA infections. Test performance is not FDA approved in patients less than 51 years old. Performed at [facility], [place] [address] [place],  [address]   Urine Culture     Status: Abnormal   Collection Time: 11/02/21  8:46 PM   Specimen: In/Out Cath Urine  Result Value Ref Range Status   Specimen Description IN/OUT CATH URINE  Final   Special Requests   Final    NONE Performed at [facility], [place] [address] [place], [place] [address]    Culture 2,000 COLONIES/mL STAPHYLOCOCCUS EPIDERMIDIS (A)  Final   Report Status 11/05/2021 FINAL  Final   Organism ID, Bacteria STAPHYLOCOCCUS EPIDERMIDIS (A)  Final      Susceptibility   Staphylococcus epidermidis - MIC*    CIPROFLOXACIN <=0.5 SENSITIVE Sensitive     GENTAMICIN <=0.5 SENSITIVE Sensitive     NITROFURANTOIN <=16 SENSITIVE Sensitive     OXACILLIN >=4 RESISTANT Resistant     TETRACYCLINE <=1 SENSITIVE Sensitive     VANCOMYCIN 1 SENSITIVE Sensitive     TRIMETH/SULFA <=10 SENSITIVE Sensitive     CLINDAMYCIN >=8 RESISTANT Resistant     RIFAMPIN <=0.5 SENSITIVE Sensitive     Inducible Clindamycin NEGATIVE Sensitive     * 2,000 COLONIES/mL STAPHYLOCOCCUS EPIDERMIDIS          Radiology Studies: No results found.      Scheduled Meds:  amitriptyline  10 mg Oral Daily   amLODipine  10 mg Oral Daily   arformoterol  15 mcg Nebulization BID   aspirin EC  81 mg Oral Daily   budesonide (PULMICORT) nebulizer solution  0.25 mg Nebulization BID   carvedilol  3.125 mg Oral BID WC   clopidogrel  75 mg Oral Daily   dicyclomine  20 mg Oral TID   dronabinol  2.5 mg Oral BID AC   feeding supplement (NEPRO CARB STEADY)  237 mL Oral BID BM   ferrous sulfate  325 mg Oral Q breakfast   heparin  5,000 Units Subcutaneous Q12H   hydrALAZINE  50 mg Oral Q8H    hydrOXYzine  25 mg Oral QHS   ipratropium-albuterol  3 mL Nebulization TID   lidocaine  1 patch Transdermal Daily   magnesium oxide  400 mg Oral BID   multivitamin with minerals  1 tablet Oral QHS   polyethylene glycol  17 g Oral Daily   predniSONE  40 mg Oral Q breakfast   pregabalin  75 mg Oral Daily   rosuvastatin  40 mg Oral Daily   sodium bicarbonate  1,300 mg Oral TID   sodium zirconium cyclosilicate  10 g Oral BID   umeclidinium bromide  1 puff Inhalation  Daily   valproic acid  250 mg Oral QHS   Continuous Infusions:     LOS: 4 days       Oren Binet, MD [facility]   To contact the attending provider between 7A-7P or the covering provider during after hours 7P-7A, please log into the web site [email or web address] and access using universal [place] password for that web site. If you do not have the password, please call the hospital operator.  11/06/2021, 12:34 PM   Patient ID: Alyssa [name], female   DOB: 05/04/1941, 81 y.o.   MRN: [id number]

## 2021-11-07 LAB — CULTURE, BLOOD (ROUTINE X 2)
Culture: NO GROWTH
Culture: NO GROWTH
Special Requests: ADEQUATE
Special Requests: ADEQUATE

## 2021-11-07 LAB — POTASSIUM: Potassium: 5.3 mmol/L — ABNORMAL HIGH (ref 3.5–5.1)

## 2021-11-07 IMAGING — US US RENAL
1 series · 14 of 25 positions shown · non-contrast
Comparison: Renal ultrasound dated 09/17/2019.

CLINICAL DATA: 79-year-old female with acute renal insufficiency.

EXAM:
RENAL / URINARY TRACT ULTRASOUND COMPLETE

[Series 1: us renal · 14 of 55 slices shown]
[im 1/55]
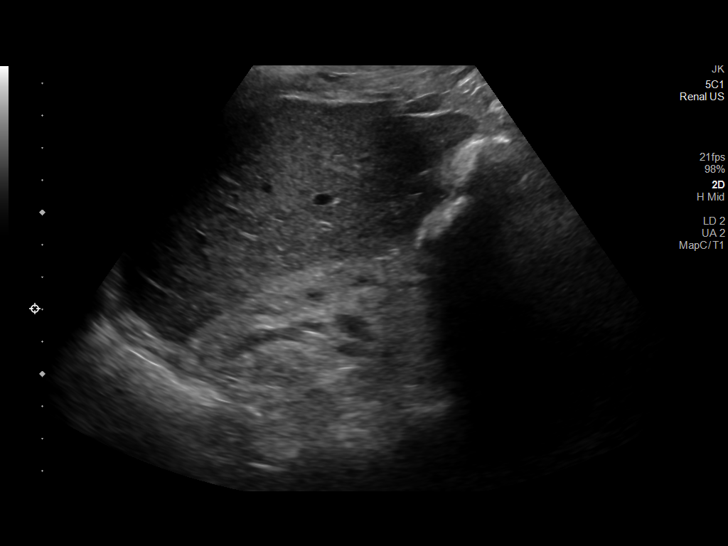
[im 5/55]
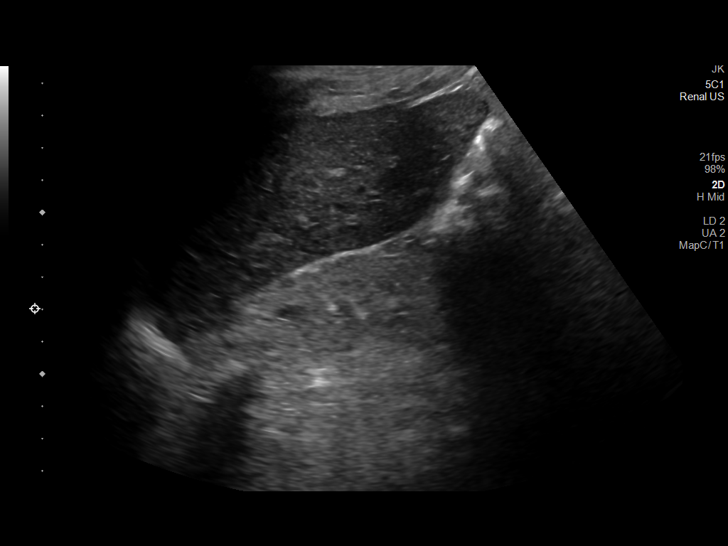
[im 10/55]
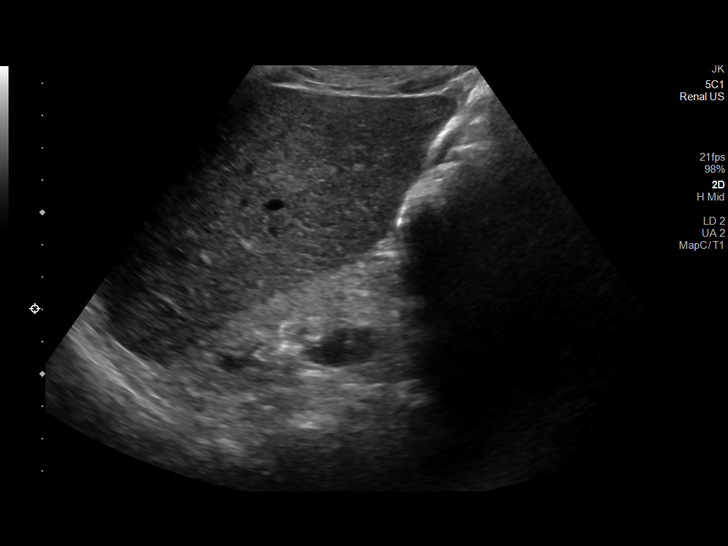
[im 14/55]
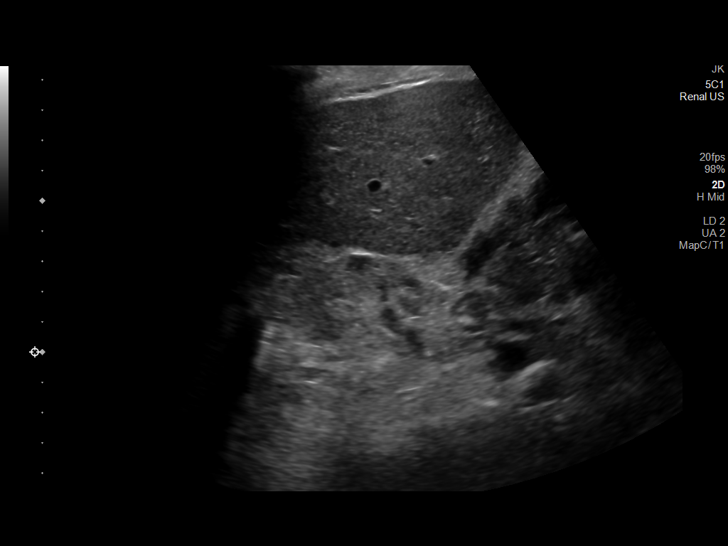
[im 19/55]
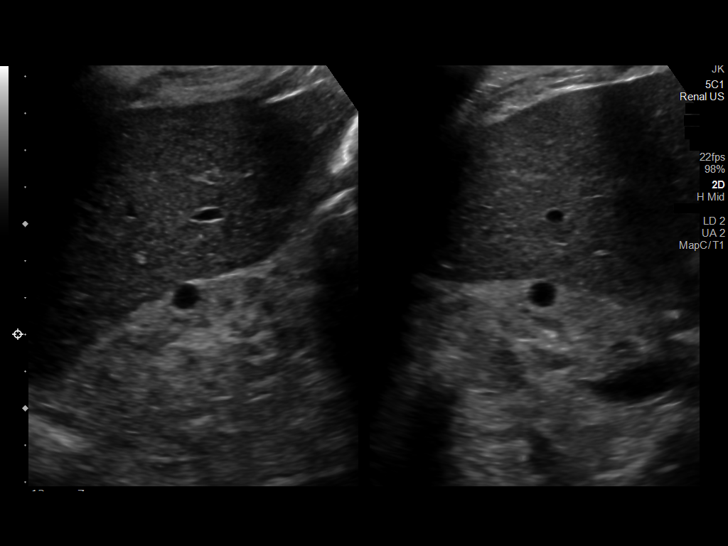
[im 21/55]
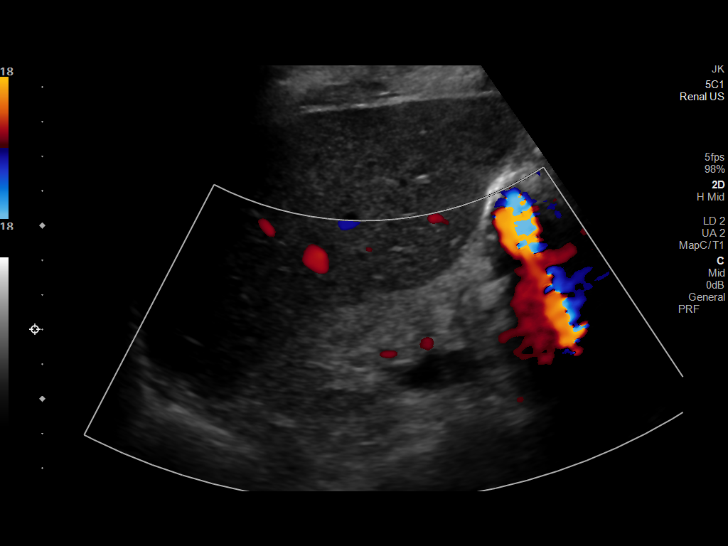
[im 25/55]
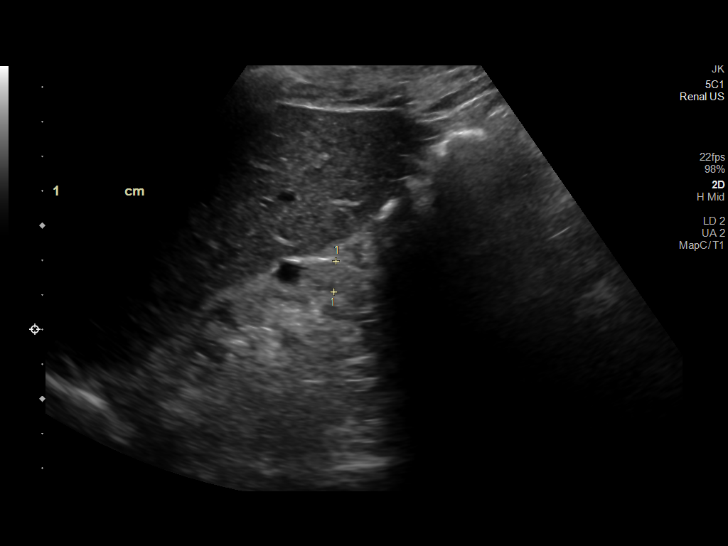
[im 30/55]
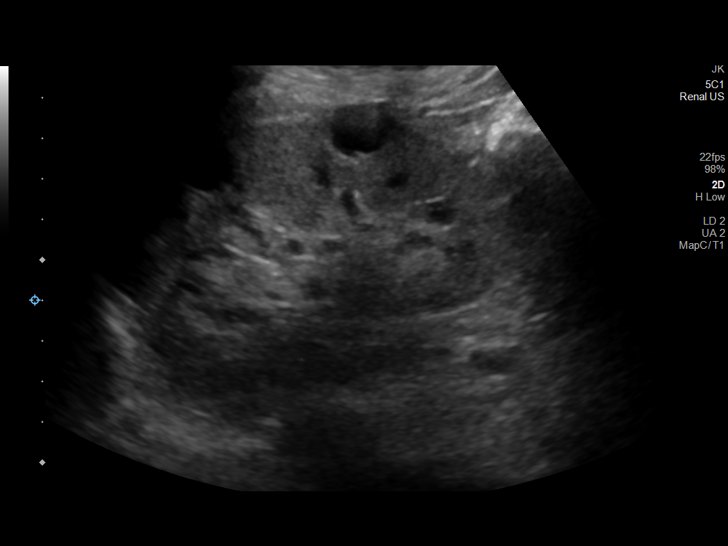
[im 34/55]
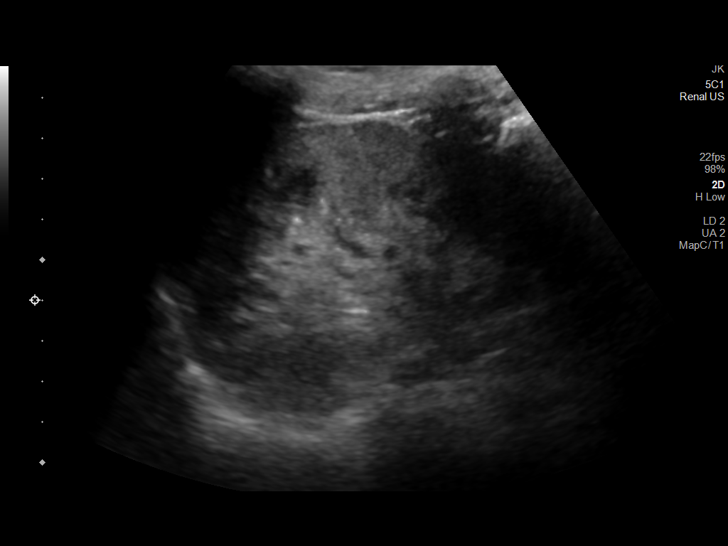
[im 37/55]
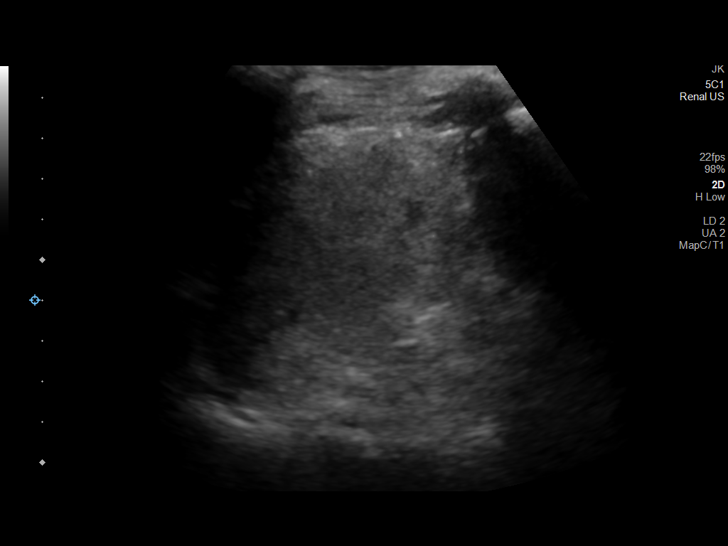
[im 41/55]
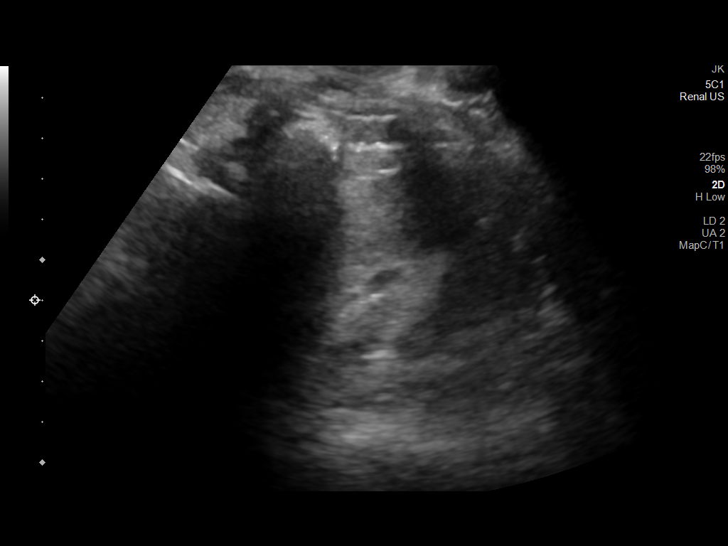
[im 46/55]
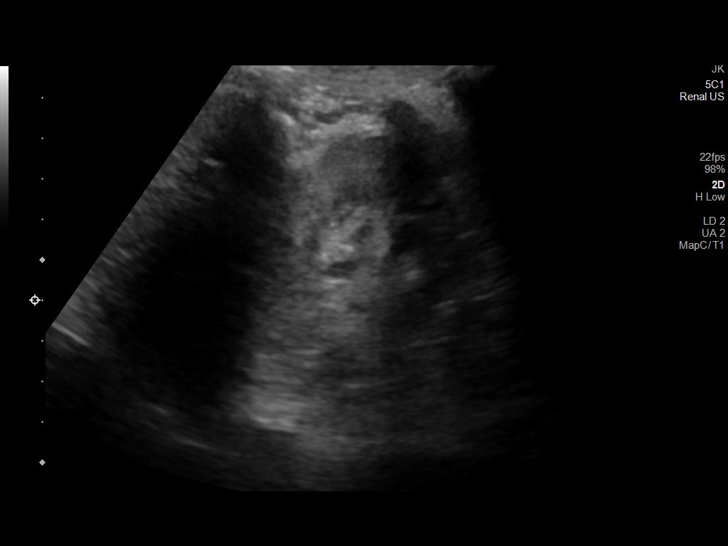
[im 50/55]
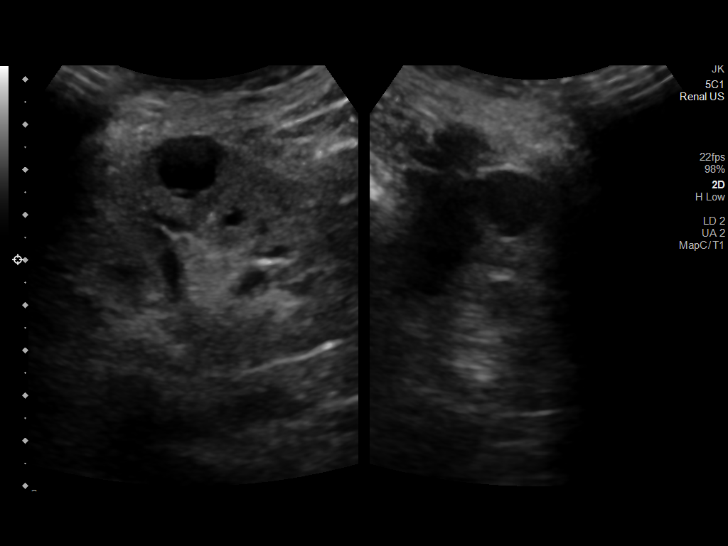
[im 55/55]
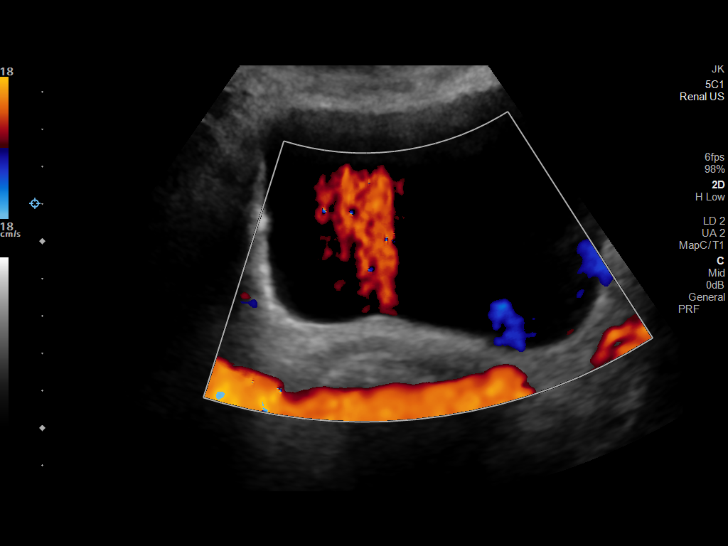

[14 of 25 positions shown; findings below may reference images not displayed]

FINDINGS: Right Kidney:

Renal measurements: 7.1 x 3.3 x 3.2 cm = volume: 39 mL. The right
kidney is atrophic and echogenic in keeping with chronic kidney
disease. No hydronephrosis or shadowing stone. There is a
subcentimeter upper pole cyst.

Left Kidney:

Renal measurements: 9.3 x 5.7 x 4.3 cm = volume: 120 mL. The left
kidney is echogenic. No hydronephrosis or shadowing stone. There is
a 1.4 cm interpolar cyst.

Bladder:

Appears normal for degree of bladder distention.

Other:

None.
IMPRESSION: 1. Echogenic kidneys in keeping with chronic kidney disease. No
hydronephrosis or shadowing stone.
2. Stable right renal atrophy.
3. Bilateral renal cysts.

## 2021-11-07 IMAGING — CT CT CERVICAL SPINE W/O CM
3 series · 12 of 33 positions shown, 14 images · non-contrast
Comparison: 01/02/2020, 07/09/2020

CLINICAL DATA: Syncope, headache, head and neck trauma

EXAM:
CT HEAD WITHOUT CONTRAST
CT CERVICAL SPINE WITHOUT CONTRAST
TECHNIQUE: Multidetector CT imaging of the head and cervical spine was
performed following the standard protocol without intravenous
contrast. Multiplanar CT image reconstructions of the cervical spine
were also generated.

[Series 4: c_spine 2.0 st · axial · 0.44mm/px · z∈[-219,-135]mm · 4 of 62 slices shown, 5 images]
[im 10/62  soft-tissue]
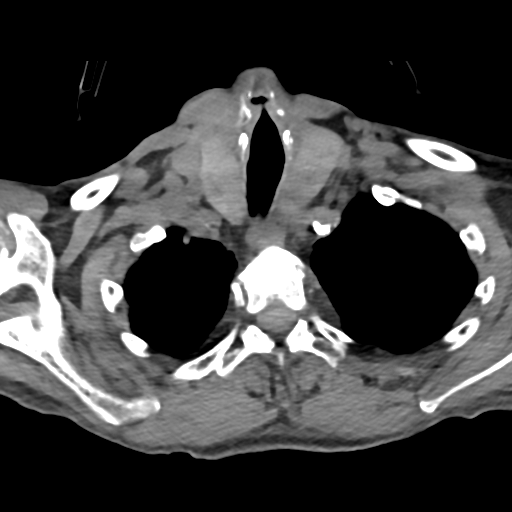
[im 10/62  bone]
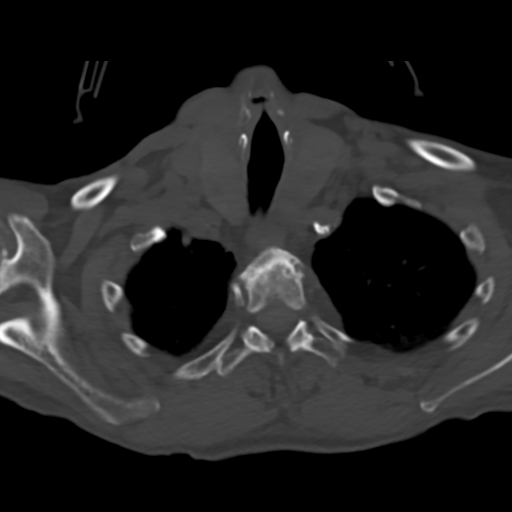
[im 24/62  bone]
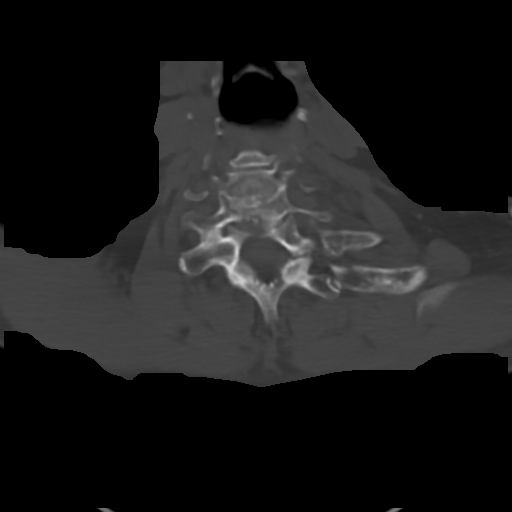
[im 38/62  bone]
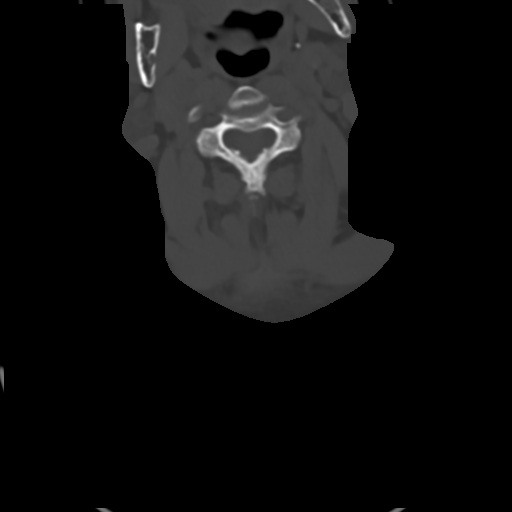
[im 52/62  bone]
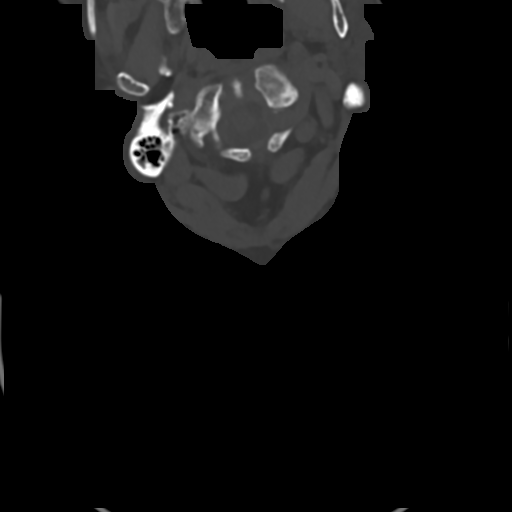

[Series 8: c_spine 2.0 sag bone · sagittal · 0.26mm/px · 5 of 61 slices shown, 6 images]
[im 21/61  bone]
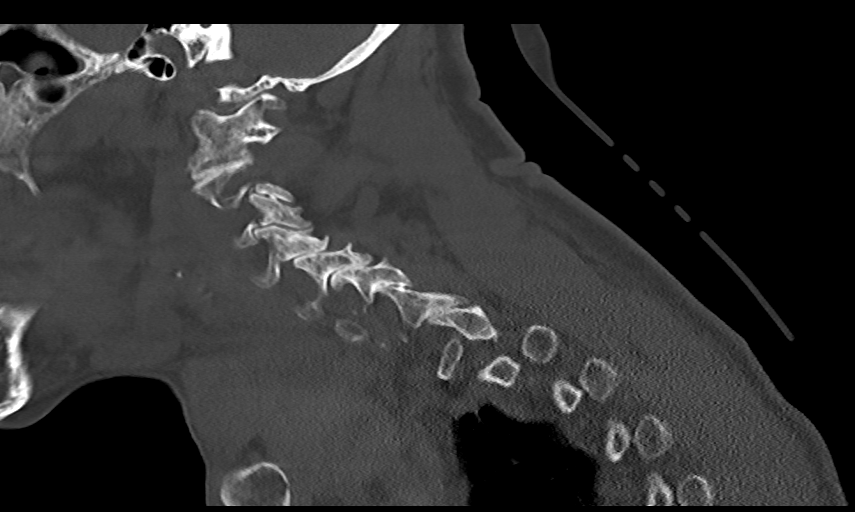
[im 26/61  bone]
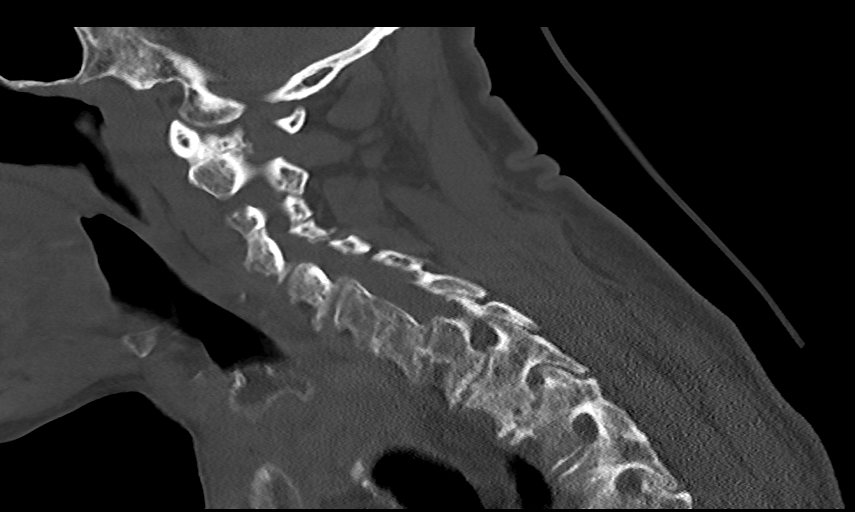
[im 31/61  soft-tissue]
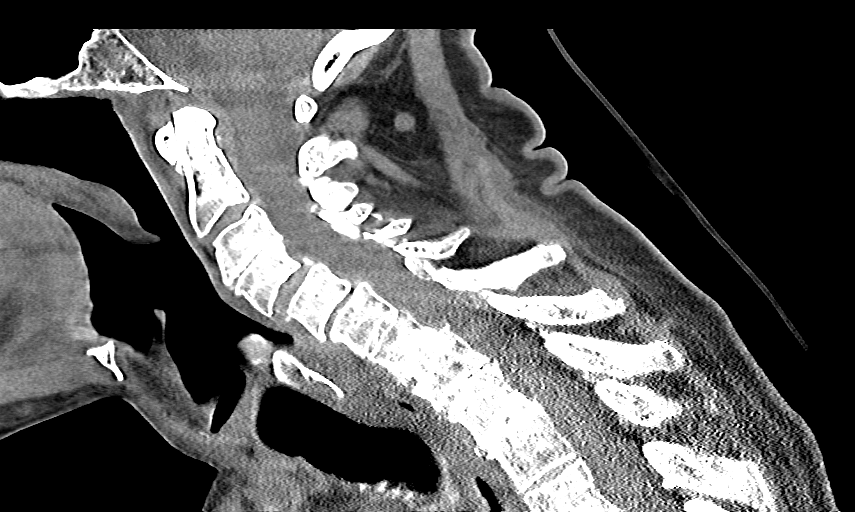
[im 31/61  bone]
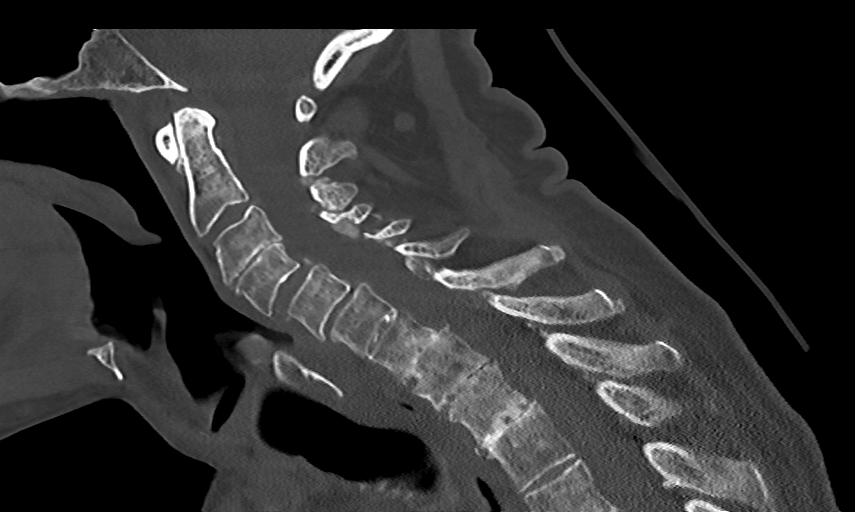
[im 36/61  bone]
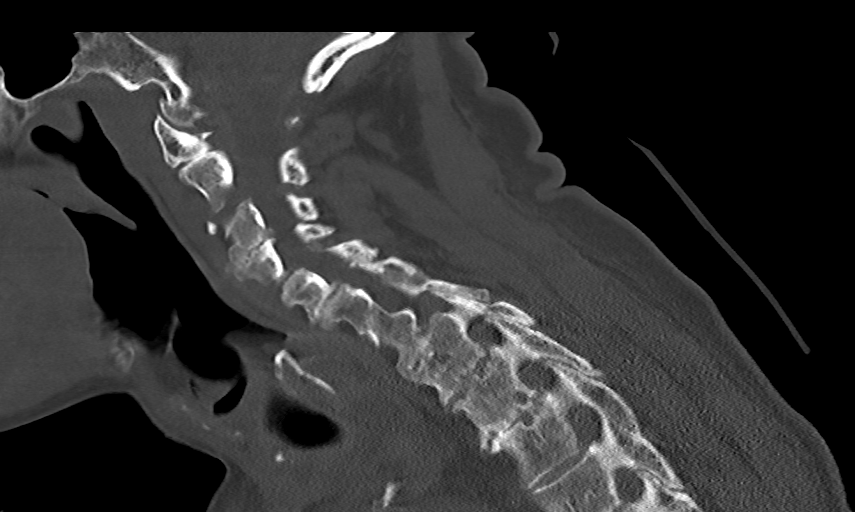
[im 41/61  bone]
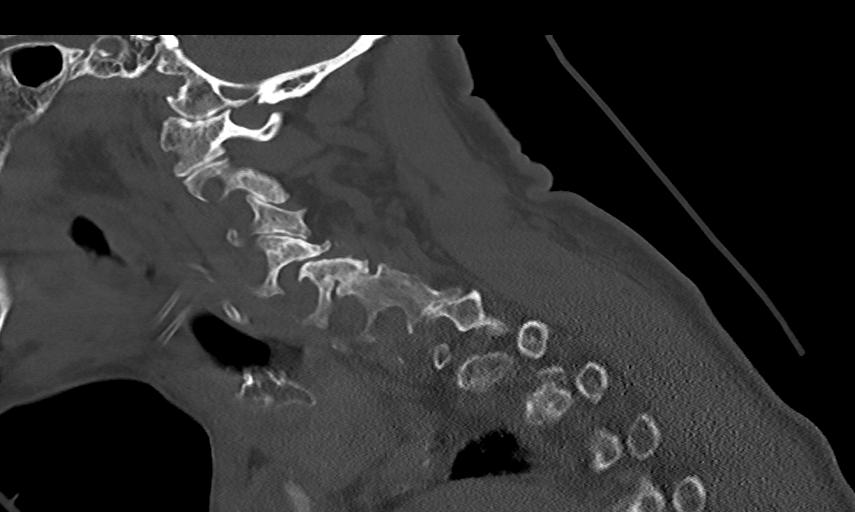

[Series 9: c_spine 2.0 cor bone · coronal · 0.23mm/px · 3 of 112 slices shown]
[im 23/112  bone]
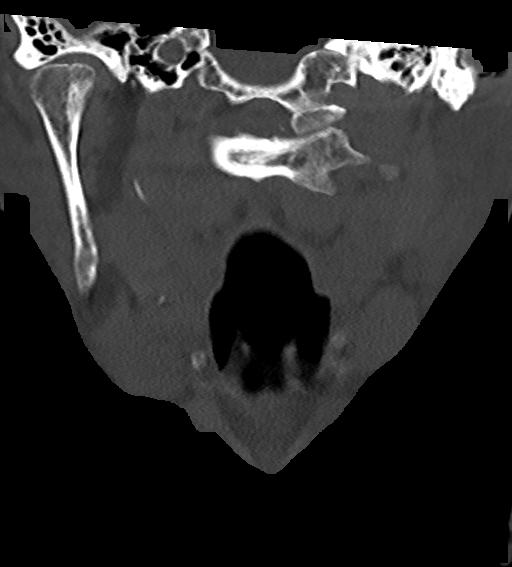
[im 45/112  bone]
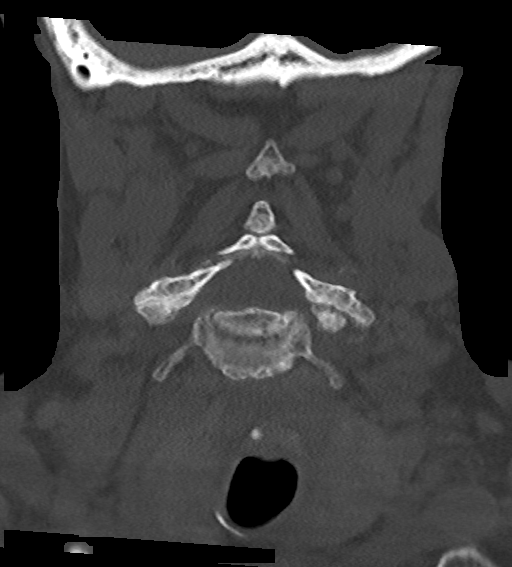
[im 67/112  bone]
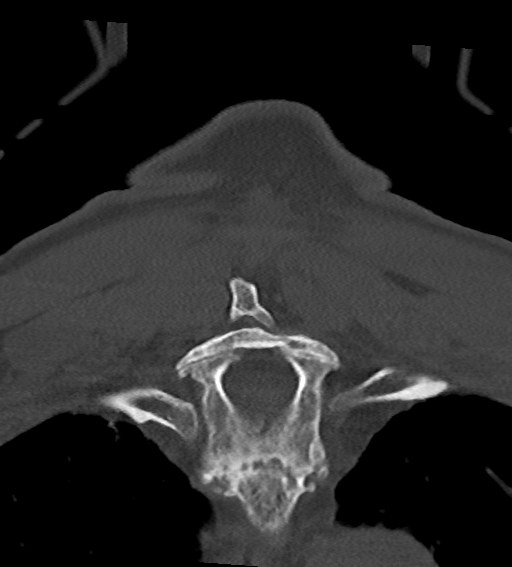

[12 of 33 positions shown; findings below may reference images not displayed]

FINDINGS: CT HEAD FINDINGS

Brain: No evidence of acute infarction, hemorrhage, hydrocephalus,
extra-axial collection or mass lesion/mass effect. Moderate
low-density changes within the periventricular and subcortical white
matter compatible with chronic microvascular ischemic change. Mild
diffuse cerebral volume loss.

Vascular: Atherosclerotic calcifications involving the large vessels
of the skull base. No unexpected hyperdense vessel.

Skull: Normal. Negative for fracture or focal lesion.

Sinuses/Orbits: Mucosal thickening within the visualized right
maxillary sinus and right sphenoid sinus. Scattered partial
opacification within several mastoid air cells bilaterally. Orbital
structures within normal limits.

Other: Negative for scalp hematoma.

CT CERVICAL SPINE FINDINGS

Alignment: Unchanged grade 1 anterolisthesis C4 on C5, C5 on C6, and
C7 on T1. No facet joint dislocation. Dens and lateral masses remain
aligned.

Skull base and vertebrae: No acute fracture. No primary bone lesion
or focal pathologic process.

Soft tissues and spinal canal: No prevertebral fluid or swelling. No
visible canal hematoma.

Disc levels: Advanced multilevel degenerative changes within the
cervical spine and visualized upper thoracic spine, not appreciably
changed compared to the prior study.

Upper chest: Centrilobular emphysema within the visualized lung
apices.

Other: Enlarged multinodular thyroid gland. Aortic and bilateral
carotid atherosclerosis.
IMPRESSION: 1. No acute intracranial findings.
2. Chronic microvascular ischemic changes and cerebral volume loss.
3. No evidence of acute fracture or traumatic subluxation of the
cervical spine.
4. Advanced multilevel degenerative changes of the cervical spine,
not appreciably changed compared to the prior study.
5. Right maxillary and sphenoid sinus disease.
6. Partial opacification within several mastoid air cells
bilaterally. Correlate for signs and symptoms of mastoiditis.
7. Enlarged multinodular thyroid gland. Recommend thyroid ultrasound
on a nonemergent basis (ref: [HOSPITAL]. [DATE]):

## 2021-11-07 IMAGING — CT CT HEAD W/O CM
4 series · 15 of 47 positions shown, 17 images · non-contrast
Comparison: 01/02/2020, 07/09/2020

CLINICAL DATA: Syncope, headache, head and neck trauma

EXAM:
CT HEAD WITHOUT CONTRAST
CT CERVICAL SPINE WITHOUT CONTRAST
TECHNIQUE: Multidetector CT imaging of the head and cervical spine was
performed following the standard protocol without intravenous
contrast. Multiplanar CT image reconstructions of the cervical spine
were also generated.

[Series 3: head without · axial · non-contrast · 0.43mm/px · z∈[-107,+3]mm · 7 of 30 slices shown, 9 images]
[im 4/30  brain]
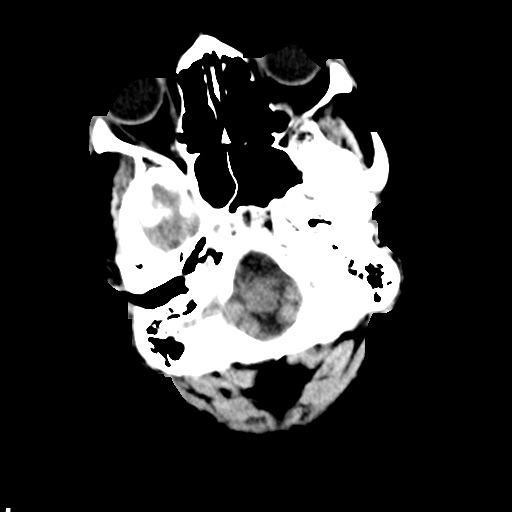
[im 4/30  bone]
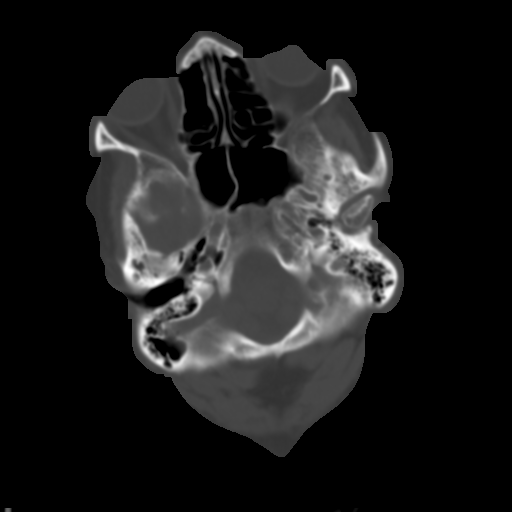
[im 8/30  brain]
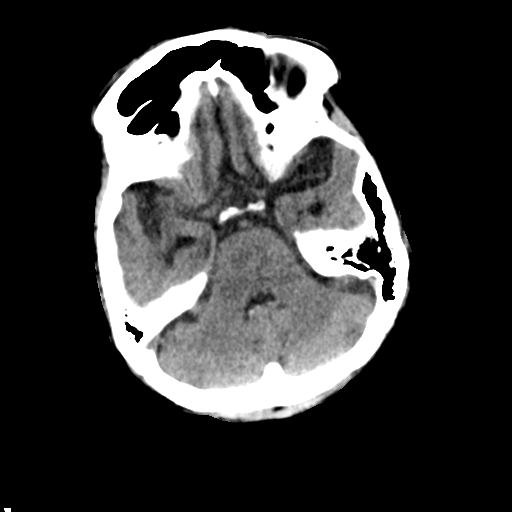
[im 11/30  brain]
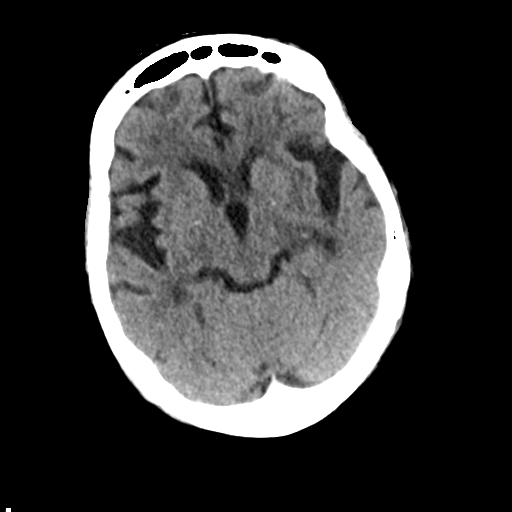
[im 15/30  brain]
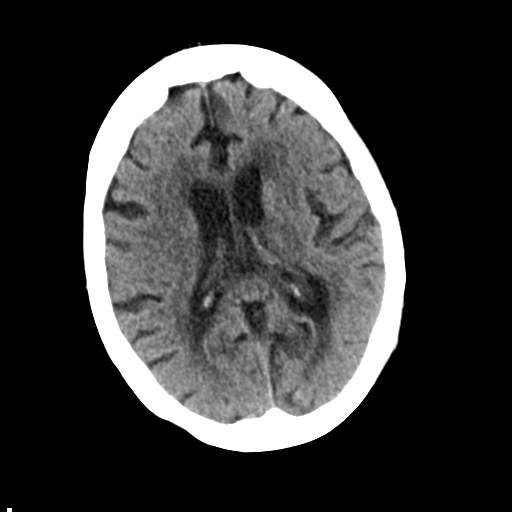
[im 19/30  brain]
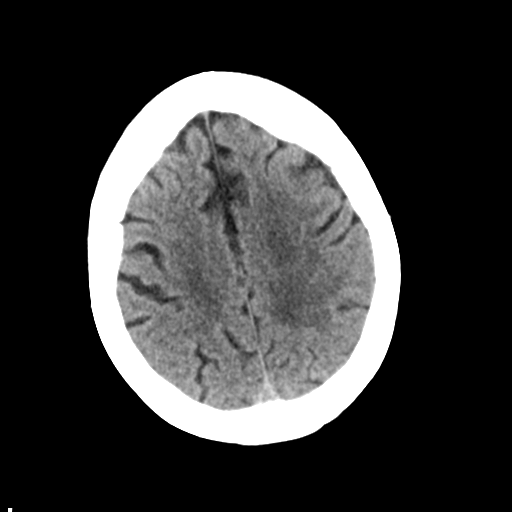
[im 19/30  bone]
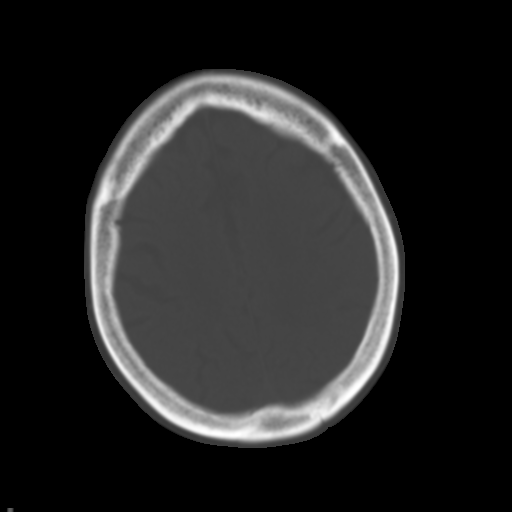
[im 22/30  brain]
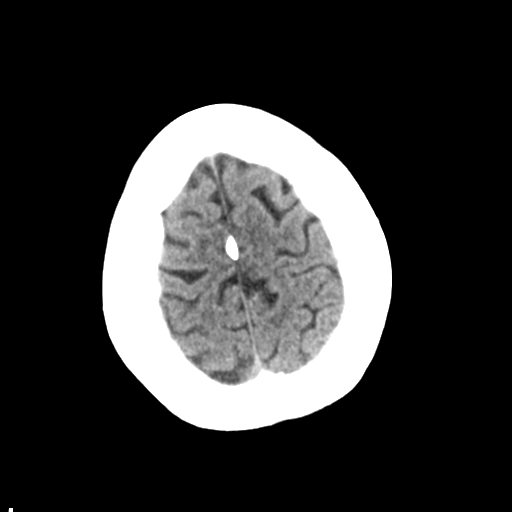
[im 26/30  brain]
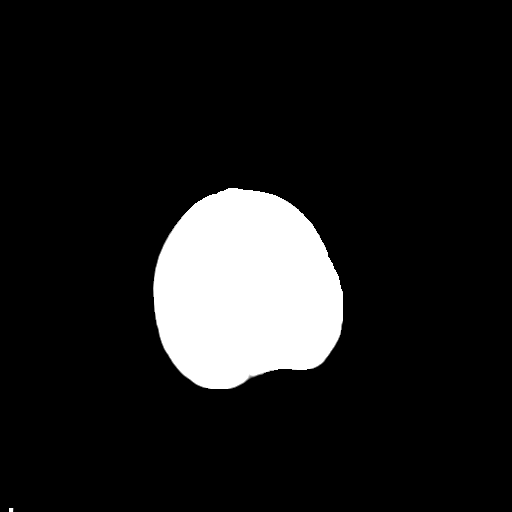

[Series 4: head bone · axial · 0.43mm/px · z∈[-108,-94]mm · 2 of 74 slices shown]
[im 8/74  bone]
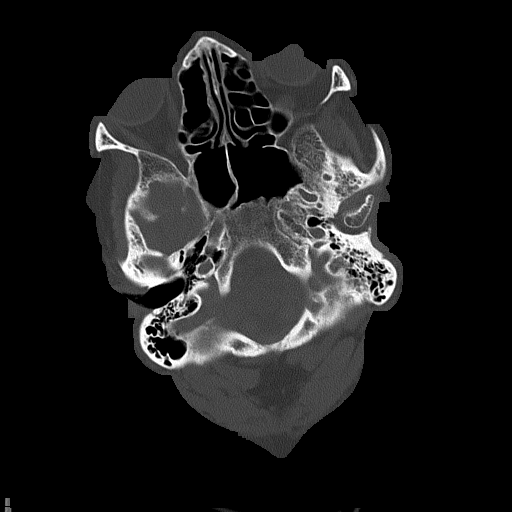
[im 15/74  bone]
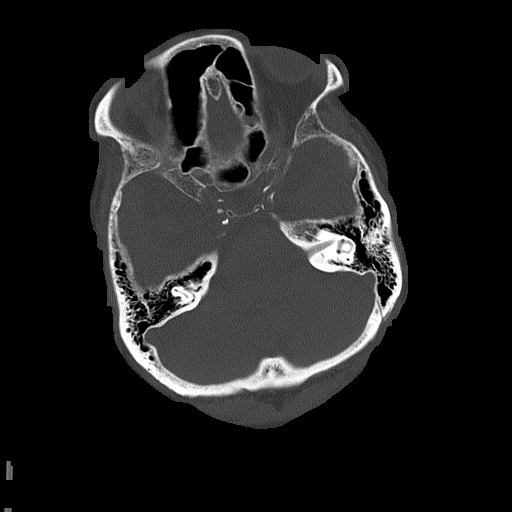

[Series 5: head without cor · coronal · non-contrast · 0.30mm/px · 3 of 72 slices shown]
[im 24/72  brain]
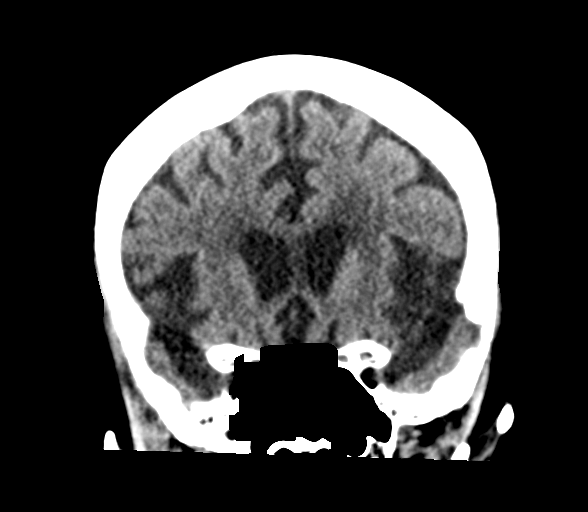
[im 32/72  brain]
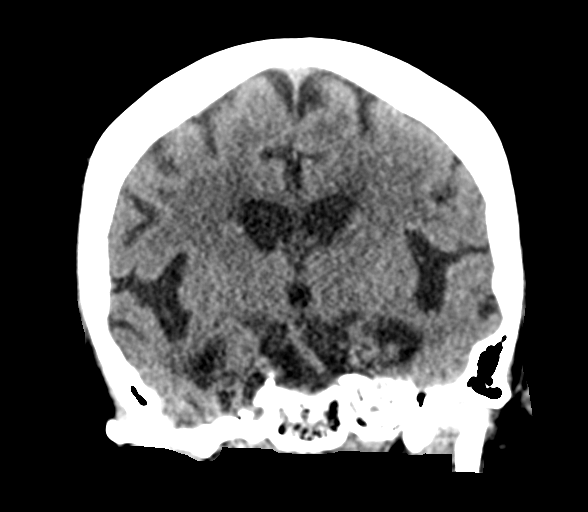
[im 40/72  brain]
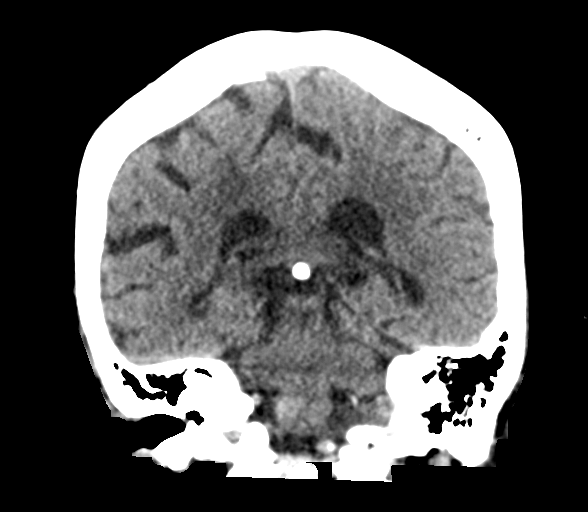

[Series 6: head without sag · sagittal · non-contrast · 0.30mm/px · 3 of 60 slices shown]
[im 20/60  brain]
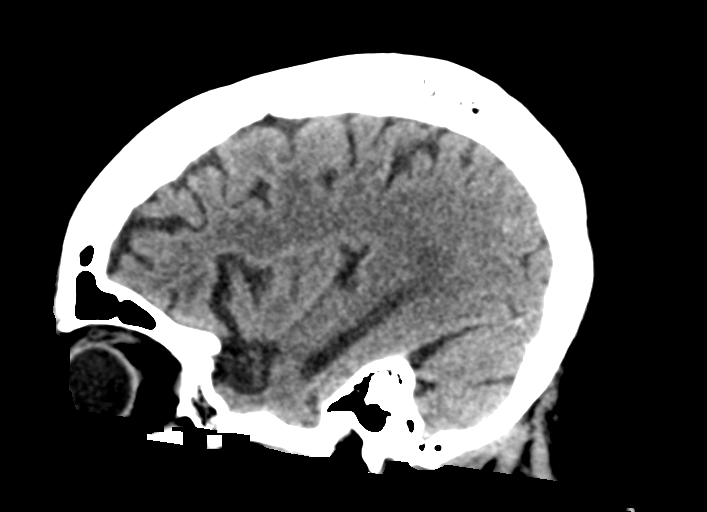
[im 30/60  brain]
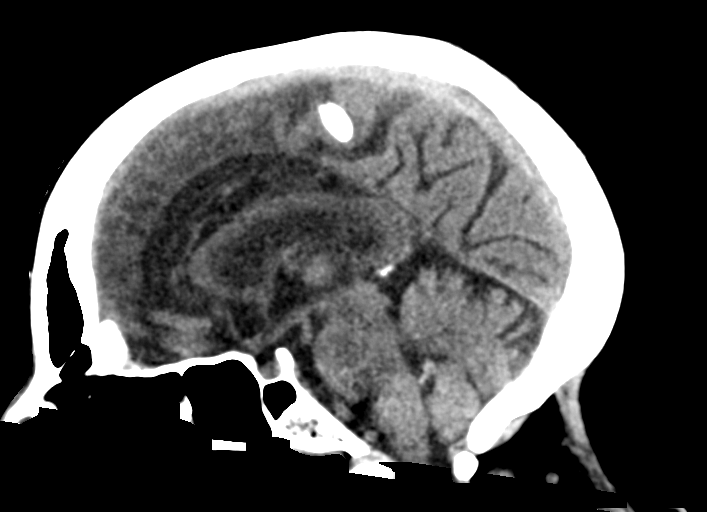
[im 40/60  brain]
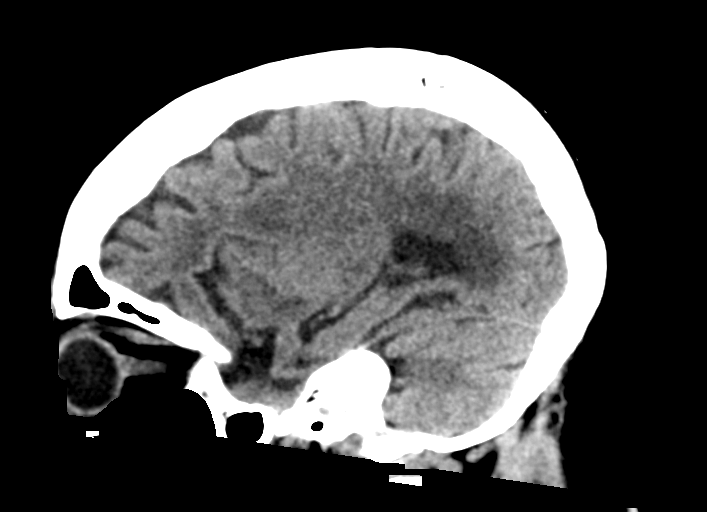

[15 of 47 positions shown; findings below may reference images not displayed]

FINDINGS: CT HEAD FINDINGS

Brain: No evidence of acute infarction, hemorrhage, hydrocephalus,
extra-axial collection or mass lesion/mass effect. Moderate
low-density changes within the periventricular and subcortical white
matter compatible with chronic microvascular ischemic change. Mild
diffuse cerebral volume loss.

Vascular: Atherosclerotic calcifications involving the large vessels
of the skull base. No unexpected hyperdense vessel.

Skull: Normal. Negative for fracture or focal lesion.

Sinuses/Orbits: Mucosal thickening within the visualized right
maxillary sinus and right sphenoid sinus. Scattered partial
opacification within several mastoid air cells bilaterally. Orbital
structures within normal limits.

Other: Negative for scalp hematoma.

CT CERVICAL SPINE FINDINGS

Alignment: Unchanged grade 1 anterolisthesis C4 on C5, C5 on C6, and
C7 on T1. No facet joint dislocation. Dens and lateral masses remain
aligned.

Skull base and vertebrae: No acute fracture. No primary bone lesion
or focal pathologic process.

Soft tissues and spinal canal: No prevertebral fluid or swelling. No
visible canal hematoma.

Disc levels: Advanced multilevel degenerative changes within the
cervical spine and visualized upper thoracic spine, not appreciably
changed compared to the prior study.

Upper chest: Centrilobular emphysema within the visualized lung
apices.

Other: Enlarged multinodular thyroid gland. Aortic and bilateral
carotid atherosclerosis.
IMPRESSION: 1. No acute intracranial findings.
2. Chronic microvascular ischemic changes and cerebral volume loss.
3. No evidence of acute fracture or traumatic subluxation of the
cervical spine.
4. Advanced multilevel degenerative changes of the cervical spine,
not appreciably changed compared to the prior study.
5. Right maxillary and sphenoid sinus disease.
6. Partial opacification within several mastoid air cells
bilaterally. Correlate for signs and symptoms of mastoiditis.
7. Enlarged multinodular thyroid gland. Recommend thyroid ultrasound
on a nonemergent basis (ref: [HOSPITAL]. [DATE]):

## 2021-11-07 NOTE — Progress Notes (Signed)
PROGRESS NOTE    Alyssa [name]  QJJ:[id number] DOB: [birth year] DOA: 11/02/2021 PCP: Pcp, No   Chief Complaint  Patient presents with   Shortness of Breath    Brief Narrative:  Alyssa [name] is a 81 y.o. 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  Subjective: Lying comfortably in bed-no major issues overnight.  Denies any chest pain to me.  Objective: Vitals: Blood pressure (!) 157/95, pulse 84, temperature 97.6 F (36.4 C), resp. rate 12, height 5\' 3"  (1.6 m), weight 43.1 kg, SpO2 98 %.   Exam: Gen Exam:Alert awake-not in any distress HEENT:atraumatic, normocephalic Chest: Scattered rhonchi but moving air well. CVS:S1S2 regular Abdomen:soft non tender, non distended Extremities:no edema Neurology: Non focal Skin: no rash   Assessment & Plan: COPD exacerbation: Improved-minimal wheezing-completed a course of antibiotics-steroids being tapered down-continue bronchodilators.    Hyperkalemia: Continues to have mild hyperkalemia-will get her first twice daily dosing of Lokelma today-recheck electrolytes tomorrow.    Right groin pseudoaneurysm: Evaluated by vascular surgery during her most recent hospitalization-not a candidate for surgical repair.  Continue supportive care-palliative care evaluation appreciated-Home hospice on discharge.     PAD: Continue aspirin/Plavix/statin-given poor overall state of health/frailty-do not think any further intervention is warranted/indicated.  Stage IV CKD: Renal function stable and close to baseline-has been deemed not a HD candidate per prior notes.  Continues to have intermittent hyperkalemia-on Lokelma.  Normocytic anemia: Due to CKD-no indication for transfusion.   HTN: BP stable-continue Norvasc, hydralazine and Coreg.   HLD: Continue  Crestor  Dementia: Appears mild-continue Depakote/Vistaril and amitriptyline.  Maintain delirium precautions.   Chronic pain: Continue Lyrica and as needed narcotics/Robaxin.    Palliative care: DNR already in place-appears very frail-clearly not a candidate for aggressive care.  Per vascular surgery not a candidate for repair of pseudoaneurysm of her right groin.  Per prior notes-not a candidate for HD.  Per social worker/case manager-family requesting SNF placement.   DVT prophylaxis: Heparin Code Status: DNR Family Communication: None at bedside Disposition:   Status is: Inpatient  Remains inpatient appropriate because: Slowly resolving COPD exacerbation-family requesting SNF placement-insurance authorization/SNF bed placement in progress.      Consultants:  Palliative medicine S   Data Reviewed: I have personally reviewed following labs and imaging studies  CBC: Recent Labs  Lab 11/02/21 1250 11/02/21 1524 11/03/21 0431  WBC 8.3  --  8.6  NEUTROABS 6.4  --   --   HGB 12.7 8.8* 8.9*  HCT 39.9 26.0* 26.7*  MCV 84.4  --  82.9  PLT 352  --  409*     Basic Metabolic Panel: Recent Labs  Lab 11/02/21 1250 11/02/21 1524 11/05/21 0839 11/06/21 0111 11/07/21 0019  NA 142 143 140  --   --   K 5.8* 5.0 5.5* 5.6* 5.3*  CL 111  --  106  --   --   CO2 16*  --  21*  --   --   GLUCOSE 101*  --  146*  --   --   BUN 37*  --  56*  --   --   CREATININE 2.73*  --  2.82*  --   --   CALCIUM 9.2  --  9.0  --   --      GFR: Estimated Creatinine Clearance: 11.2 mL/min (A) (by C-G  formula based on SCr of 2.82 mg/dL (H)).  Liver Function Tests: Recent Labs  Lab 11/02/21 1250  AST 39  ALT 16  ALKPHOS 79  BILITOT 0.8  PROT 6.8  ALBUMIN 3.1*     CBG: No results for input(s): GLUCAP in the last 168 hours.   Recent Results (from the past 240 hour(s))  Resp Panel by RT-PCR (Flu A&B, Covid) Nasopharyngeal Swab     Status: None   Collection Time: 11/02/21 11:27 AM    Specimen: Nasopharyngeal Swab; Nasopharyngeal(NP) swabs in vial transport medium  Result Value Ref Range Status   SARS Coronavirus 2 by RT PCR NEGATIVE NEGATIVE Final    Comment: (NOTE) SARS-CoV-2 target nucleic acids are NOT DETECTED.  The SARS-CoV-2 RNA is generally detectable in upper respiratory specimens during the acute phase of infection. The lowest concentration of SARS-CoV-2 viral copies this assay can detect is 138 copies/mL. A negative result does not preclude SARS-Cov-2 infection and should not be used as the sole basis for treatment or other patient management decisions. A negative result may occur with  improper specimen collection/handling, submission of specimen other than nasopharyngeal swab, presence of viral mutation(s) within the areas targeted by this assay, and inadequate number of viral copies(<138 copies/mL). A negative result must be combined with clinical observations, patient history, and epidemiological information. The expected result is Negative.  Fact Sheet for Patients:  EntrepreneurPulse.com.au  Fact Sheet for Healthcare Providers:  IncredibleEmployment.be  This test is no t yet approved or cleared by the [place] FDA and  has been authorized for detection and/or diagnosis of SARS-CoV-2 by FDA under an Emergency Use Authorization (EUA). This EUA will remain  in effect (meaning this test can be used) for the duration of the COVID-19 declaration under Section 564(b)(1) of the Act, 21 U.S.C.section 360bbb-3(b)(1), unless the authorization is terminated  or revoked sooner.       Influenza A by PCR NEGATIVE NEGATIVE Final   Influenza B by PCR NEGATIVE NEGATIVE Final    Comment: (NOTE) The Xpert Xpress SARS-CoV-2/FLU/RSV plus assay is intended as an aid in the diagnosis of influenza from Nasopharyngeal swab specimens and should not be used as a sole basis for treatment. Nasal washings and aspirates are  unacceptable for Xpert Xpress SARS-CoV-2/FLU/RSV testing.  Fact Sheet for Patients: EntrepreneurPulse.com.au  Fact Sheet for Healthcare Providers: IncredibleEmployment.be  This test is not yet approved or cleared by the [place] FDA and has been authorized for detection and/or diagnosis of SARS-CoV-2 by FDA under an Emergency Use Authorization (EUA). This EUA will remain in effect (meaning this test can be used) for the duration of the COVID-19 declaration under Section 564(b)(1) of the Act, 21 U.S.C. section 360bbb-3(b)(1), unless the authorization is terminated or revoked.  Performed at [facility], [place] [address], [place], [place] [address]   Blood Culture (routine x 2)     Status: None   Collection Time: 11/02/21 11:27 AM   Specimen: BLOOD  Result Value Ref Range Status   Specimen Description BLOOD BLOOD LEFT ARM  Final   Special Requests   Final    BOTTLES DRAWN AEROBIC AND ANAEROBIC Blood Culture adequate volume   Culture   Final    NO GROWTH 5 DAYS Performed at [facility] [address] [place], [place] [address]    Report Status 11/07/2021 FINAL  Final  Blood Culture (routine x 2)     Status: None   Collection Time: 11/02/21 11:32 AM  Specimen: Left Antecubital; Blood  Result Value Ref Range Status   Specimen Description LEFT ANTECUBITAL  Final   Special Requests   Final    BOTTLES DRAWN AEROBIC AND ANAEROBIC Blood Culture adequate volume   Culture   Final    NO GROWTH 5 DAYS Performed at [facility], [address] [place], [place] [address]    Report Status 11/07/2021 FINAL  Final  MRSA Next Gen by PCR, Nasal     Status: None   Collection Time: 11/02/21  3:00 PM   Specimen: Nasal Mucosa; Nasal Swab  Result Value Ref Range Status   MRSA by PCR Next Gen NOT DETECTED NOT DETECTED Final    Comment: (NOTE) The GeneXpert MRSA Assay (FDA approved for NASAL specimens only), is one component of a  comprehensive MRSA colonization surveillance program. It is not intended to diagnose MRSA infection nor to guide or monitor treatment for MRSA infections. Test performance is not FDA approved in patients less than 1 years old. Performed at [facility], [place] [address] [place], [place] [address]   Urine Culture     Status: Abnormal   Collection Time: 11/02/21  8:46 PM   Specimen: In/Out Cath Urine  Result Value Ref Range Status   Specimen Description IN/OUT CATH URINE  Final   Special Requests   Final    NONE Performed at [facility], [place] [address], [place], [place] [address]    Culture 2,000 COLONIES/mL STAPHYLOCOCCUS EPIDERMIDIS (A)  Final   Report Status 11/05/2021 FINAL  Final   Organism ID, Bacteria STAPHYLOCOCCUS EPIDERMIDIS (A)  Final      Susceptibility   Staphylococcus epidermidis - MIC*    CIPROFLOXACIN <=0.5 SENSITIVE Sensitive     GENTAMICIN <=0.5 SENSITIVE Sensitive     NITROFURANTOIN <=16 SENSITIVE Sensitive     OXACILLIN >=4 RESISTANT Resistant     TETRACYCLINE <=1 SENSITIVE Sensitive     VANCOMYCIN 1 SENSITIVE Sensitive     TRIMETH/SULFA <=10 SENSITIVE Sensitive     CLINDAMYCIN >=8 RESISTANT Resistant     RIFAMPIN <=0.5 SENSITIVE Sensitive     Inducible Clindamycin NEGATIVE Sensitive     * 2,000 COLONIES/mL STAPHYLOCOCCUS EPIDERMIDIS          Radiology Studies: No results found.      Scheduled Meds:  amitriptyline  10 mg Oral Daily   amLODipine  10 mg Oral Daily   arformoterol  15 mcg Nebulization BID   aspirin EC  81 mg Oral Daily   budesonide (PULMICORT) nebulizer solution  0.25 mg Nebulization BID   carvedilol  3.125 mg Oral BID WC   clopidogrel  75 mg Oral Daily   dicyclomine  20 mg Oral TID   dronabinol  2.5 mg Oral BID AC   feeding supplement (NEPRO CARB STEADY)  237 mL Oral BID BM   ferrous sulfate  325 mg Oral Q breakfast   heparin  5,000 Units Subcutaneous Q12H   hydrALAZINE  50 mg Oral Q8H   hydrOXYzine  25 mg Oral  QHS   ipratropium-albuterol  3 mL Nebulization TID   lidocaine  1 patch Transdermal Daily   magnesium oxide  400 mg Oral BID   multivitamin with minerals  1 tablet Oral QHS   polyethylene glycol  17 g Oral Daily   predniSONE  30 mg Oral Q breakfast   pregabalin  75 mg Oral Daily   rosuvastatin  40 mg Oral Daily   sodium bicarbonate  1,300  mg Oral TID   sodium zirconium cyclosilicate  10 g Oral BID   umeclidinium bromide  1 puff Inhalation Daily   valproic acid  250 mg Oral QHS   Continuous Infusions:     LOS: 5 days       Oren Binet, MD [facility]   To contact the attending provider between 7A-7P or the covering provider during after hours 7P-7A, please log into the web site [email or web address] and access using universal [place] password for that web site. If you do not have the password, please call the hospital operator.  11/07/2021, 2:18 PM   Patient ID: [name], female   DOB: Jan 31, 1941, 81 y.o.   MRN: [id number]

## 2021-11-08 NOTE — Progress Notes (Signed)
Manufacturing engineer [facility]) Hospital Liaison: RN note    Notified by Transition of Care Manger of patient/family request for [facility] services at home after discharge. Chart and patient information under review by [facility] physician. Hospice eligibility pending currently.    Writer spoke with  son, [name] to initiate education related to hospice philosophy, services and team approach to care.  [name] verbalized understanding of information given.   Please send signed and completed DNR form home with patient/family. Patient will need prescriptions for discharge comfort medications.     DME needs have been discussed, patient currently has the following equipment in the home: Gilford Rile, W/C.  Patient/family requests the following DME for delivery to the home: none.    [facility] Referral [facility] aware of the above. Please notify [facility] when patient is ready to leave the unit at discharge. (Call [phone] or [phone] after 5pm.) [facility] information and contact numbers given to [facility].      A Please do not hesitate to call with questions.    Thank you,   Farrel Gordon, RN, [facility] Liaison   [phone]

## 2021-11-08 NOTE — Progress Notes (Signed)
Occupational Therapy Treatment Patient Details Name: Alyssa [name] MRN: [id number] DOB: 1941-04-04 Today's Date: 11/08/2021   History of present illness pt is a 81 y/o female admitted 1/16 with increasing SOB and worsening weakness.  Work up includes COPD exacerbation, protein calorie malnutrition and CKD.  PMH includes HTN; HLD; PVD; dementia; and CKD.   OT comments  Patient received in supine and agreeable to OT treatment. Patient was able to get to EOB with min guard assist and donned socks seated on EOB with min assist. Patient was able to perform step pivot transfer to recliner. Patient declined bathing but perform grooming and gown changed. AROM to increase activity tolerance. Acute OT to continue to follow.    Recommendations for follow up therapy are one component of a multi-disciplinary discharge planning process, led by the attending physician.  Recommendations may be updated based on patient status, additional functional criteria and insurance authorization.    Follow Up Recommendations  Home health OT (may need SNF if not provided 24 hour assistance)    Assistance Recommended at Discharge Intermittent Supervision/Assistance  Patient can return home with the following  Assistance with cooking/housework;Direct supervision/assist for medications management;Direct supervision/assist for financial management;Assist for transportation;Help with stairs or ramp for entrance;A lot of help with bathing/dressing/bathroom;A lot of help with walking and/or transfers   Equipment Recommendations  Hospital bed    Recommendations for Other Services      Precautions / Restrictions Precautions Precautions: Fall Precaution Comments: Watch HR; urinary incontinence Restrictions Weight Bearing Restrictions: No       Mobility Bed Mobility Overal bed mobility: Needs Assistance Bed Mobility: Supine to Sit     Supine to sit: Min guard, HOB elevated     General bed mobility  comments: min guard with trunk    Transfers Overall transfer level: Needs assistance Equipment used: None Transfers: Sit to/from Stand, Bed to chair/wheelchair/BSC Sit to Stand: Min assist     Step pivot transfers: Min assist     General transfer comment: Assist for balance     Balance Overall balance assessment: Needs assistance Sitting-balance support: No upper extremity supported, Feet supported Sitting balance-Leahy Scale: Fair Sitting balance - Comments: able to sit on EOB without assistance   Standing balance support: No upper extremity supported Standing balance-Leahy Scale: Poor Standing balance comment: poor standing tolerance                           ADL either performed or assessed with clinical judgement   ADL Overall ADL's : Needs assistance/impaired     Grooming: Wash/dry hands;Wash/dry face;Set up;Sitting Grooming Details (indicate cue type and reason): performed seated in recliner         Upper Body Dressing : Minimal assistance;Sitting Upper Body Dressing Details (indicate cue type and reason): donned gown with assistance due to lines Lower Body Dressing: Minimal assistance;Sitting/lateral leans Lower Body Dressing Details (indicate cue type and reason): donned socks seated on EOB               General ADL Comments: performed transfer to recliner with step pivot transfer without an assistive device    Extremity/Trunk Assessment              Vision       Perception     Praxis      Cognition Arousal/Alertness: Awake/alert Behavior During Therapy: WFL for tasks assessed/performed Overall Cognitive Status: No family/caregiver present to determine baseline cognitive functioning  General Comments: followed questions well, oriented towards date        Exercises Exercises: General Upper Extremity General Exercises - Upper Extremity Shoulder Flexion: AROM, Both, 10 reps,  Seated Shoulder Extension: AROM, Both, 10 reps, Seated Shoulder ABduction: AROM, Both, 10 reps, Seated Shoulder ADduction: AROM, Both, 10 reps, Seated    Shoulder Instructions       General Comments      Pertinent Vitals/ Pain       Pain Assessment Pain Assessment: Faces Faces Pain Scale: Hurts little more Pain Location: back Pain Descriptors / Indicators: Aching, Discomfort, Grimacing Pain Intervention(s): Limited activity within patient's tolerance, Monitored during session, Patient requesting pain meds-RN notified  Home Living                                          Prior Functioning/Environment              Frequency  Min 2X/week        Progress Toward Goals  OT Goals(current goals can now be found in the care plan section)  Progress towards OT goals: Progressing toward goals  Acute Rehab OT Goals Patient Stated Goal: go home OT Goal Formulation: With patient Time For Goal Achievement: 11/19/21 Potential to Achieve Goals: Good ADL Goals Pt Will Perform Grooming: with supervision;with set-up;sitting Pt Will Perform Upper Body Bathing: with min guard assist;with supervision;sitting Pt Will Perform Lower Body Bathing: with mod assist;with min assist;sitting/lateral leans Pt Will Perform Upper Body Dressing: with min guard assist;with supervision;sitting Pt Will Transfer to Toilet: with min assist;with min guard assist;ambulating;stand pivot transfer Pt Will Perform Toileting - Clothing Manipulation and hygiene: with min assist;with min guard assist;sit to/from stand  Plan Discharge plan remains appropriate    Co-evaluation                 AM-PAC OT "6 Clicks" Daily Activity     Outcome Measure   Help from another person eating meals?: None Help from another person taking care of personal grooming?: A Little Help from another person toileting, which includes using toliet, bedpan, or urinal?: A Lot Help from another person  bathing (including washing, rinsing, drying)?: A Lot Help from another person to put on and taking off regular upper body clothing?: A Little Help from another person to put on and taking off regular lower body clothing?: A Little 6 Click Score: 17    End of Session Equipment Utilized During Treatment: Gait belt  OT Visit Diagnosis: Unsteadiness on feet (R26.81);Muscle weakness (generalized) (M62.81);Pain;Other abnormalities of gait and mobility (R26.89) Pain - Right/Left: Left Pain - part of body: Hip   Activity Tolerance Patient limited by fatigue   Patient Left in chair;with call bell/phone within reach;with chair alarm set   Nurse Communication Mobility status        Time: 4034-7425 OT Time Calculation (min): 25 min  Charges: OT General Charges $OT Visit: 1 Visit OT Treatments $Self Care/Home Management : 8-22 mins $Therapeutic Exercise: 8-22 mins  Lodema Hong, OTA Acute Rehabilitation Services  Pager [phone] Office [place] 11/08/2021, 8:28 AM

## 2021-11-08 NOTE — Progress Notes (Signed)
PROGRESS NOTE    Alyssa [name]  QMV:[id number] DOB: 1940-11-16 DOA: 11/02/2021 PCP: Pcp, No   Chief Complaint  Patient presents with   Shortness of Breath    Brief Narrative:  Alyssa [name] is a 81 y.o. 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  Subjective: Sitting at bedside chair-looks significantly better than the past few days.  Denies any shortness of breath or chest pain.  Objective: Vitals: Blood pressure (!) 137/93, pulse 97, temperature 97.7 F (36.5 C), temperature source Oral, resp. rate 19, height 5\' 3"  (1.6 m), weight 43.1 kg, SpO2 98 %.   Exam: Gen Exam:Alert awake-not in any distress HEENT:atraumatic, normocephalic Chest: Scattered rhonchi but moving air well. CVS:S1S2 regular Abdomen:soft non tender, non distended Extremities:no edema Neurology: Non focal Skin: no rash   Assessment & Plan: COPD exacerbation: Improved-minimal wheezing-completed a course of antibiotics-steroids being tapered down-continue bronchodilators.    Hyperkalemia: Continues to have mild hyperkalemia-continue Lokelma-we will check electrolytes periodically.  Right groin pseudoaneurysm: Evaluated by vascular surgery during her most recent hospitalization-not a candidate for surgical repair.  Continue supportive care-palliative care evaluation appreciated-Home hospice on discharge.     PAD: Continue aspirin/Plavix/statin-given poor overall state of health/frailty-do not think any further intervention is warranted/indicated.  Stage IV CKD: Renal function stable and close to baseline-has been deemed not a HD candidate per prior notes.  Continues to have intermittent hyperkalemia-on Lokelma.  Normocytic anemia: Due to CKD-no indication for transfusion.   HTN: BP stable-continue Norvasc,  hydralazine and Coreg.   HLD: Continue Crestor  Dementia: Appears mild-continue Depakote/Vistaril and amitriptyline.  Maintain delirium precautions.   Chronic pain: Continue Lyrica and as needed narcotics/Robaxin.    Palliative care: DNR already in place-appears very frail-clearly not a candidate for aggressive care.  Per vascular surgery not a candidate for repair of pseudoaneurysm of her right groin.  Per prior notes-not a candidate for HD.  Per social worker/case manager-family requesting SNF placement.   DVT prophylaxis: Heparin Code Status: DNR Family Communication: None at bedside Disposition:   Status is: Inpatient  Remains inpatient appropriate because: Slowly resolving COPD exacerbation-family requesting SNF placement-insurance authorization/SNF bed placement in progress.      Consultants:  Palliative medicine S   Data Reviewed: I have personally reviewed following labs and imaging studies  CBC: Recent Labs  Lab 11/02/21 1250 11/02/21 1524 11/03/21 0431  WBC 8.3  --  8.6  NEUTROABS 6.4  --   --   HGB 12.7 8.8* 8.9*  HCT 39.9 26.0* 26.7*  MCV 84.4  --  82.9  PLT 352  --  409*     Basic Metabolic Panel: Recent Labs  Lab 11/02/21 1250 11/02/21 1524 11/05/21 0839 11/06/21 0111 11/07/21 0019  NA 142 143 140  --   --   K 5.8* 5.0 5.5* 5.6* 5.3*  CL 111  --  106  --   --   CO2 16*  --  21*  --   --   GLUCOSE 101*  --  146*  --   --   BUN 37*  --  56*  --   --   CREATININE 2.73*  --  2.82*  --   --   CALCIUM 9.2  --  9.0  --   --      GFR: Estimated Creatinine Clearance: 11.2 mL/min (A) (by  C-G formula based on SCr of 2.82 mg/dL (H)).  Liver Function Tests: Recent Labs  Lab 11/02/21 1250  AST 39  ALT 16  ALKPHOS 79  BILITOT 0.8  PROT 6.8  ALBUMIN 3.1*     CBG: No results for input(s): GLUCAP in the last 168 hours.   Recent Results (from the past 240 hour(s))  Resp Panel by RT-PCR (Flu A&B, Covid) Nasopharyngeal Swab     Status:  None   Collection Time: 11/02/21 11:27 AM   Specimen: Nasopharyngeal Swab; Nasopharyngeal(NP) swabs in vial transport medium  Result Value Ref [place] Status   SARS Coronavirus 2 by RT PCR NEGATIVE NEGATIVE Final    Comment: (NOTE) SARS-CoV-2 target nucleic acids are NOT DETECTED.  The SARS-CoV-2 RNA is generally detectable in upper respiratory specimens during the acute phase of infection. The lowest concentration of SARS-CoV-2 viral copies this assay can detect is 138 copies/mL. A negative result does not preclude SARS-Cov-2 infection and should not be used as the sole basis for treatment or other patient management decisions. A negative result may occur with  improper specimen collection/handling, submission of specimen other than nasopharyngeal swab, presence of viral mutation(s) within the areas targeted by this assay, and inadequate number of viral copies(<138 copies/mL). A negative result must be combined with clinical observations, patient history, and epidemiological information. The expected result is Negative.  Fact Sheet for Patients:  EntrepreneurPulse.com.au  Fact Sheet for Healthcare Providers:  IncredibleEmployment.be  This test is no t yet approved or cleared by the [place] FDA and  has been authorized for detection and/or diagnosis of SARS-CoV-2 by FDA under an Emergency Use Authorization (EUA). This EUA will remain  in effect (meaning this test can be used) for the duration of the COVID-19 declaration under Section 564(b)(1) of the Act, 21 U.S.C.section 360bbb-3(b)(1), unless the authorization is terminated  or revoked sooner.       Influenza A by PCR NEGATIVE NEGATIVE Final   Influenza B by PCR NEGATIVE NEGATIVE Final    Comment: (NOTE) The Xpert Xpress SARS-CoV-2/FLU/RSV plus assay is intended as an aid in the diagnosis of influenza from Nasopharyngeal swab specimens and should not be used as a sole basis for  treatment. Nasal washings and aspirates are unacceptable for Xpert Xpress SARS-CoV-2/FLU/RSV testing.  Fact Sheet for Patients: EntrepreneurPulse.com.au  Fact Sheet for Healthcare Providers: IncredibleEmployment.be  This test is not yet approved or cleared by the [place] FDA and has been authorized for detection and/or diagnosis of SARS-CoV-2 by FDA under an Emergency Use Authorization (EUA). This EUA will remain in effect (meaning this test can be used) for the duration of the COVID-19 declaration under Section 564(b)(1) of the Act, 21 U.S.C. section 360bbb-3(b)(1), unless the authorization is terminated or revoked.  Performed at [facility], [place] [address], [place], [place] [address]   Blood Culture (routine x 2)     Status: None   Collection Time: 11/02/21 11:27 AM   Specimen: BLOOD  Result Value Ref [place] Status   Specimen Description BLOOD BLOOD LEFT ARM  Final   Special Requests   Final    BOTTLES DRAWN AEROBIC AND ANAEROBIC Blood Culture adequate volume   Culture   Final    NO GROWTH 5 DAYS Performed at [facility] [address] [place], [place] [address]    Report Status 11/07/2021 FINAL  Final  Blood Culture (routine x 2)     Status: None   Collection Time: 11/02/21 11:32 AM  Specimen: Left Antecubital; Blood  Result Value Ref [place] Status   Specimen Description LEFT ANTECUBITAL  Final   Special Requests   Final    BOTTLES DRAWN AEROBIC AND ANAEROBIC Blood Culture adequate volume   Culture   Final    NO GROWTH 5 DAYS Performed at [facility], [address] [place], [place] [address]    Report Status 11/07/2021 FINAL  Final  MRSA Next Gen by PCR, Nasal     Status: None   Collection Time: 11/02/21  3:00 PM   Specimen: Nasal Mucosa; Nasal Swab  Result Value Ref [place] Status   MRSA by PCR Next Gen NOT DETECTED NOT DETECTED Final    Comment: (NOTE) The GeneXpert MRSA Assay (FDA approved for  NASAL specimens only), is one component of a comprehensive MRSA colonization surveillance program. It is not intended to diagnose MRSA infection nor to guide or monitor treatment for MRSA infections. Test performance is not FDA approved in patients less than 21 years old. Performed at [facility], [place] [address], [place], [place] [address]   Urine Culture     Status: Abnormal   Collection Time: 11/02/21  8:46 PM   Specimen: In/Out Cath Urine  Result Value Ref [place] Status   Specimen Description IN/OUT CATH URINE  Final   Special Requests   Final    NONE Performed at [facility], [place] [address], [place], [place] [address]    Culture 2,000 COLONIES/mL STAPHYLOCOCCUS EPIDERMIDIS (A)  Final   Report Status 11/05/2021 FINAL  Final   Organism ID, Bacteria STAPHYLOCOCCUS EPIDERMIDIS (A)  Final      Susceptibility   Staphylococcus epidermidis - MIC*    CIPROFLOXACIN <=0.5 SENSITIVE Sensitive     GENTAMICIN <=0.5 SENSITIVE Sensitive     NITROFURANTOIN <=16 SENSITIVE Sensitive     OXACILLIN >=4 RESISTANT Resistant     TETRACYCLINE <=1 SENSITIVE Sensitive     VANCOMYCIN 1 SENSITIVE Sensitive     TRIMETH/SULFA <=10 SENSITIVE Sensitive     CLINDAMYCIN >=8 RESISTANT Resistant     RIFAMPIN <=0.5 SENSITIVE Sensitive     Inducible Clindamycin NEGATIVE Sensitive     * 2,000 COLONIES/mL STAPHYLOCOCCUS EPIDERMIDIS          Radiology Studies: No results found.      Scheduled Meds:  amitriptyline  10 mg Oral Daily   amLODipine  10 mg Oral Daily   arformoterol  15 mcg Nebulization BID   aspirin EC  81 mg Oral Daily   budesonide (PULMICORT) nebulizer solution  0.25 mg Nebulization BID   carvedilol  3.125 mg Oral BID WC   clopidogrel  75 mg Oral Daily   dicyclomine  20 mg Oral TID   dronabinol  2.5 mg Oral BID AC   feeding supplement (NEPRO CARB STEADY)  237 mL Oral BID BM   ferrous sulfate  325 mg Oral Q breakfast   heparin  5,000 Units Subcutaneous Q12H    hydrALAZINE  50 mg Oral Q8H   hydrOXYzine  25 mg Oral QHS   ipratropium-albuterol  3 mL Nebulization TID   lidocaine  1 patch Transdermal Daily   magnesium oxide  400 mg Oral BID   multivitamin with minerals  1 tablet Oral QHS   polyethylene glycol  17 g Oral Daily   predniSONE  30 mg Oral Q breakfast   pregabalin  75 mg Oral Daily   rosuvastatin  40 mg Oral Daily   sodium bicarbonate  1,300  mg Oral TID   sodium zirconium cyclosilicate  10 g Oral BID   umeclidinium bromide  1 puff Inhalation Daily   valproic acid  250 mg Oral QHS   Continuous Infusions:     LOS: 6 days       Oren Binet, MD [facility]   To contact the attending provider between 7A-7P or the covering provider during after hours 7P-7A, please log into the web site [email or web address] and access using universal [place] password for that web site. If you do not have the password, please call the hospital operator.  11/08/2021, 11:58 AM   Patient ID: Alyssa [name], female   DOB: 22-May-1941, 81 y.o.   MRN: [id number]

## 2021-11-08 NOTE — Care Management (Addendum)
Spoke w CSw who stated that son chose [facility] for home hospice, notified liaison Bevely Palmer of referral.  Patient was previously active w [facility] hospice and son was not happy with them.

## 2021-11-08 NOTE — TOC Progression Note (Signed)
Transition of Care [facility]) - Progression Note    Patient Details  Name: Alyssa [name] MRN: [id number] Date of Birth: 06-Sep-1941  Transition of Care [facility]) CM/SW [place], [place] Phone Number: 11/08/2021, 2:49 PM  Clinical Narrative:    CSW spoke with pt's [name] concerning his decision on SNF or home with hospice.  Pt's son decided on hospice at home for pt.  Pt's son had concerns that hospice will not be there everyday.  CSW also informed pt's son that someone will need to be at home with pt 24 hours.  Pt's [name] stated his son will be there at home with pt. CSW also informed NCM Debbie about disposition home with hospice. TOC will continue to assist with disposition planning.   Expected Discharge Plan: [place] Barriers to Discharge: Continued Medical Work up  Expected Discharge Plan and Services Expected Discharge Plan: [place]       Living arrangements for the past 2 months: Single Family Home                                       Social Determinants of Health (SDOH) Interventions    Readmission Risk Interventions Readmission Risk Prevention Plan 11/06/2021 10/09/2021 07/10/2020  Transportation Screening Complete Complete Complete  Medication Review Press photographer) Complete Complete Complete  PCP or Specialist appointment within 3-5 days of discharge Complete Complete -  [place] or Home Care Consult Complete Complete Complete  SW Recovery Care/Counseling Consult Complete Complete Complete  Palliative Care Screening Complete Not Applicable Not [place] Complete Not Applicable Not Applicable  Some recent data might be hidden

## 2021-11-09 LAB — BASIC METABOLIC PANEL
Anion gap: 11 (ref 5–15)
BUN: 75 mg/dL — ABNORMAL HIGH (ref 8–23)
CO2: 23 mmol/L (ref 22–32)
Calcium: 8.9 mg/dL (ref 8.9–10.3)
Chloride: 105 mmol/L (ref 98–111)
Creatinine, Ser: 3.03 mg/dL — ABNORMAL HIGH (ref 0.44–1.00)
GFR, Estimated: 15 mL/min — ABNORMAL LOW (ref >60)
Glucose, Bld: 265 mg/dL — ABNORMAL HIGH (ref 70–99)
Potassium: 5.4 mmol/L — ABNORMAL HIGH (ref 3.5–5.1)
Sodium: 139 mmol/L (ref 135–145)

## 2021-11-09 LAB — POTASSIUM: Potassium: 5.9 mmol/L — ABNORMAL HIGH (ref 3.5–5.1)

## 2021-11-09 MED ORDER — DEXTROSE 50 % IV SOLN
25.0000 mL | Freq: Once | INTRAVENOUS | Status: AC
Start: 2021-11-09 — End: 2021-11-09
  Administered 2021-11-09: 25 mL via INTRAVENOUS
  Filled 2021-11-09: qty 50

## 2021-11-09 MED ORDER — SODIUM ZIRCONIUM CYCLOSILICATE 10 G PO PACK
10.0000 g | PACK | Freq: Three times a day (TID) | ORAL | Status: DC
  Administered 2021-11-09 – 2021-11-12 (×10): 10 g via ORAL
  Filled 2021-11-09 (×10): qty 1

## 2021-11-09 MED ORDER — INSULIN ASPART 100 UNIT/ML IV SOLN
6.0000 [IU] | Freq: Once | INTRAVENOUS | Status: AC
  Administered 2021-11-09: 6 [IU] via INTRAVENOUS

## 2021-11-09 MED ORDER — PREDNISONE 20 MG PO TABS
20.0000 mg | ORAL_TABLET | Freq: Every day | ORAL | Status: DC
  Administered 2021-11-09: 20 mg via ORAL
  Filled 2021-11-09: qty 1

## 2021-11-09 NOTE — Discharge Summary (Addendum)
PATIENT DETAILS Name: Alyssa [name] Age: 81 y.o. Sex: female Date of Birth: [birth year] MRN: [id number]. Admitting Physician: Lequita Halt, MD PCP:Pcp, No  Admit Date: 11/02/2021 Discharge date: 11/12/2021  Recommendations for Outpatient Follow-up:  Being discharged SNF w palliative care If within goals of care-check electrolytes periodically to assess for hyperkalemia. If significant decompensation happens in the future-appropriate to transition to comfort measures.  Admitted From:  Home with home Hospice  Disposition: SNF w palliative care  Home Health: No  Equipment/Devices: None  Discharge Condition: Stable  CODE STATUS: DNR  Diet recommendation:  Diet Order             Diet - low sodium heart healthy           Diet regular Room service appropriate? Yes; Fluid consistency: Thin  Diet effective now                    Brief Summary: JERICCA [name] is a 81 y.o. 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  Brief Hospital Course: COPD exacerbation: Improved-minimal wheezing-completed a course of antibiotics-steroids will be discontinued prior to discharge.   Hyperkalemia: Chronic issue-we will continue Lokelma on discharge.  If within goals of care-please check electrolytes in 1 week.     Right groin pseudoaneurysm: Evaluated by vascular surgery during her most recent hospitalization-not a candidate for surgical repair.  Continue supportive care-palliative care evaluation appreciated-Home hospice on discharge.     PAD: Continue aspirin/Plavix/statin-given poor overall state of health/frailty-do not think any further intervention is warranted/indicated.  Stage IV CKD: Renal function stable and close to baseline-has been deemed not a HD candidate per prior notes.   Continues to have intermittent hyperkalemia-on Lokelma.   Normocytic anemia: Due to CKD-no indication for transfusion.   HTN: BP stable-continue Norvasc, hydralazine and Coreg.   HLD: Continue Crestor  Dementia: Appears mild-continue Depakote/Vistaril and amitriptyline.  Maintain delirium precautions.   Chronic pain: Continue Lyrica and as needed narcotics/Robaxin.    Palliative care: DNR already in place-appears very frail-clearly not a candidate for aggressive care.  Per vascular surgery not a candidate for repair of pseudoaneurysm of her right groin.  Per prior notes-not a candidate for HD.    Nutrition Status: Nutrition Problem: Severe Malnutrition Etiology: chronic illness (COPD, CKD stage IV) Signs/Symptoms: severe fat depletion, severe muscle depletion Interventions: Ensure Enlive (each supplement provides 350kcal and 20 grams of protein), MVI    BMI: Estimated body mass index is 16.83 kg/m as calculated from the following:   Height as of this encounter: 5\' 3"  (1.6 m).   Weight as of this encounter: 43.1 kg.     Procedures None  Discharge Diagnoses:  Principal Problem:   PNA (pneumonia) Active Problems:   Community acquired pneumonia   COPD exacerbation ([place])   Discharge Instructions:  Activity:  As tolerated with Full fall precautions use walker/cane & assistance as needed   Discharge Instructions     Call MD for:  difficulty breathing, headache or visual disturbances   Complete by: As directed    Diet - low sodium heart healthy   Complete by: As directed    Increase activity slowly   Complete by: As directed       Allergies as of 11/12/2021   No Known Allergies      Medication List  STOP taking these medications    cefdinir 300 MG capsule Commonly known as: OMNICEF       TAKE these medications    albuterol 108 (90 Base) MCG/ACT inhaler Commonly known as: VENTOLIN HFA Inhale 2 puffs into the lungs every 4 (four) hours as needed for  wheezing or shortness of breath. What changed: Another medication with the same name was added. Make sure you understand how and when to take each.   albuterol (2.5 MG/3ML) 0.083% nebulizer solution Commonly known as: PROVENTIL Inhale 3 mLs (2.5 mg total) into the lungs every 4 (four) hours as needed for wheezing or shortness of breath. What changed: You were already taking a medication with the same name, and this prescription was added. Make sure you understand how and when to take each.   amitriptyline 10 MG tablet Commonly known as: ELAVIL Take 10 mg by mouth daily.   amLODipine 10 MG tablet Commonly known as: NORVASC Take 1 tablet (10 mg total) by mouth daily.   aspirin 81 MG EC tablet Take 1 tablet (81 mg total) by mouth daily.   Breztri Aerosphere 160-9-4.8 MCG/ACT Aero Generic drug: Budeson-Glycopyrrol-Formoterol Inhale 2 puffs into the lungs 2 (two) times daily.   carvedilol 3.125 MG tablet Commonly known as: COREG Take 1 tablet (3.125 mg total) by mouth 2 (two) times daily with a meal.   CertaVite/Antioxidants Tabs Take 1 tablet by mouth at bedtime.   clopidogrel 75 MG tablet Commonly known as: PLAVIX Take 1 tablet (75 mg total) by mouth daily.   diclofenac Sodium 1 % Gel Commonly known as: VOLTAREN Apply 2 g topically every 6 (six) hours as needed (knee pain).   dicyclomine 20 MG tablet Commonly known as: BENTYL Take 20 mg by mouth 3 (three) times daily.   dronabinol 2.5 MG capsule Commonly known as: MARINOL Take 1 capsule (2.5 mg total) by mouth 2 (two) times daily before lunch and supper.   Ensure Plus Liqd Take 237 mLs by mouth daily.   hydrALAZINE 50 MG tablet Commonly known as: APRESOLINE Take 1 tablet (50 mg total) by mouth every 8 (eight) hours.   HYDROmorphone 2 MG tablet Commonly known as: DILAUDID Take 0.5-1 tablets (1-2 mg total) by mouth every 4 (four) hours as needed for severe pain or moderate pain.   hydrOXYzine 25 MG tablet Commonly  known as: ATARAX Take 25 mg by mouth at bedtime.   Iron High-Potency 325 MG Tabs Take 325 mg by mouth daily with breakfast. What changed: when to take this   lidocaine 5 % Commonly known as: LIDODERM Place 1 patch onto the skin daily.   Lokelma 10 g Pack packet Generic drug: sodium zirconium cyclosilicate Take 10 g by mouth 3 (three) times daily. Do not take this within 2 hours of other medications What changed: when to take this   LORazepam 0.5 MG tablet Commonly known as: ATIVAN Take 1 tablet (0.5 mg total) by mouth every 8 (eight) hours as needed for anxiety or sleep.   magnesium oxide 400 MG tablet Commonly known as: MAG-OX Take 400 mg by mouth 2 (two) times daily.   nitroGLYCERIN 0.4 MG SL tablet Commonly known as: NITROSTAT Place 1 tablet (0.4 mg total) under the tongue every 5 (five) minutes as needed for chest pain.   polyethylene glycol 17 g packet Commonly known as: MiraLax Take 17 g by mouth daily.   pregabalin 75 MG capsule Commonly known as: LYRICA Take 1 capsule (75 mg total) by mouth daily.  rosuvastatin 40 MG tablet Commonly known as: CRESTOR Take 40 mg by mouth daily.   sodium bicarbonate 650 MG tablet Take 2 tablets (1,300 mg total) by mouth 2 (two) times daily.   valproic acid 250 MG capsule Commonly known as: DEPAKENE Take 250 mg by mouth at bedtime.        Contact information for after-discharge care     Destination     [facility] Preferred SNF .   Service: Skilled Nursing Contact information: [address] [place] [phone]                    No Known Allergies    Consultations:  Palliative care   Other Procedures/Studies: DG Chest Port 1 View  Result Date: 11/02/2021 CLINICAL DATA:  Questionable sepsis, shortness of breath. EXAM: PORTABLE CHEST 1 VIEW COMPARISON:  September 26, 2021 FINDINGS: The heart size and mediastinal contours are stable. Mild atelectasis of left  lung base is identified. Both lungs are otherwise clear. The visualized skeletal structures are unremarkable. IMPRESSION: Mild atelectasis of left lung base. Electronically Signed   By: Abelardo Diesel M.D.   On: 11/02/2021 12:06   VAS [place] ABI WITH/WO TBI  Result Date: 10/22/2021  LOWER EXTREMITY DOPPLER STUDY Patient Name:  SYMPHONIE SCHNEIDERMAN  Date of Exam:   10/22/2021 Medical Rec #: [id number]             Accession #:    [id number] Date of Birth: 1941-06-29            Patient Gender: F Patient Age:   68 years Exam Location:  [facility] Procedure:      VAS [place] ABI WITH/WO TBI Referring Phys: Jamelle Haring --------------------------------------------------------------------------------  Indications: Peripheral artery disease. High Risk Factors: Hypertension, hyperlipidemia.  Vascular               Femoral-femoral bypass grafting in 2011, lost to follow Interventions:         up. Comparison Study: Prior ABI done 09/30/11 Performing Technologist: Sharion Dove RVS  Examination Guidelines: A complete evaluation includes at minimum, Doppler waveform signals and systolic blood pressure reading at the level of bilateral brachial, anterior tibial, and posterior tibial arteries, when vessel segments are accessible. Bilateral testing is considered an integral part of a complete examination. Photoelectric Plethysmograph (PPG) waveforms and toe systolic pressure readings are included as required and additional duplex testing as needed. Limited examinations for reoccurring indications may be performed as noted.  ABI Findings: +---------+------------------+-----+-----------+--------+  Right     Rt Pressure (mmHg) Index Waveform    Comment   +---------+------------------+-----+-----------+--------+  Brachial  198                      multiphasic           +---------+------------------+-----+-----------+--------+  PTA       97                 0.49  monophasic             +---------+------------------+-----+-----------+--------+  DP        95                 0.48  monophasic            +---------+------------------+-----+-----------+--------+  Great Toe 79                 0.40                        +---------+------------------+-----+-----------+--------+ +---------+------------------+-----+-----------+-------+  Left      Lt Pressure (mmHg) Index Waveform    Comment  +---------+------------------+-----+-----------+-------+  Brachial  193                      multiphasic          +---------+------------------+-----+-----------+-------+  PTA       85                 0.43  monophasic           +---------+------------------+-----+-----------+-------+  DP        117                0.59  monophasic           +---------+------------------+-----+-----------+-------+  Great Toe 99                 0.50                       +---------+------------------+-----+-----------+-------+ +-------+-----------+-----------+------------+------------+  ABI/TBI Today's ABI Today's TBI Previous ABI Previous TBI  +-------+-----------+-----------+------------+------------+  Right   0.49        0.40        0.94                       +-------+-----------+-----------+------------+------------+  Left    0.59        0.50        0.94                       +-------+-----------+-----------+------------+------------+ Bilateral ABIs appear decreased compared to prior study on 09/30/11.  Summary: Right: Resting right ankle-brachial index indicates severe right lower extremity arterial disease. The right toe-brachial index is abnormal. Left: Resting left ankle-brachial index indicates moderate left lower extremity arterial disease. The left toe-brachial index is abnormal.  *See table(s) above for measurements and observations.  Electronically signed by Jamelle Haring on 10/22/2021 at 4:59:29 PM.    Final    CT Renal Stone Study  Result Date: 10/21/2021 CLINICAL DATA:  Flank pain.  Concern for kidney stone. EXAM: CT  ABDOMEN AND PELVIS WITHOUT CONTRAST TECHNIQUE: Multidetector CT imaging of the abdomen and pelvis was performed following the standard protocol without IV contrast. COMPARISON:  CT of the abdomen pelvis dated 09/15/2021. FINDINGS: Evaluation of this exam is limited in the absence of intravenous contrast. Evaluation is also limited due to streak artifact caused by right hip arthroplasty and spinal hardware as well as respiratory motion. Lower chest: Minimal bibasilar atelectasis. There is coronary vascular calcification. No intra-abdominal free air or free fluid. Hepatobiliary: No focal liver abnormality is seen. No gallstones, gallbladder wall thickening, or biliary dilatation. Pancreas: The pancreas is moderately atrophic. No active inflammatory changes. Spleen: Normal in size without focal abnormality. Adrenals/Urinary Tract: The adrenal glands are grossly unremarkable. Mild right renal parenchyma atrophy. A 6 mm calcific focus over the left renal hilum, likely vascular calcification. No hydronephrosis or nephrolithiasis. Small bilateral renal hypodense lesions are not characterized on this CT. The visualized ureters and urinary bladder appear unremarkable. Stomach/Bowel: There is severe sigmoid diverticulosis. There is a small hiatal hernia. No bowel obstruction. The appendix is normal. Vascular/Lymphatic: Advanced aortoiliac atherosclerotic disease. The IVC is grossly unremarkable. No portal venous gas. There is no adenopathy. Reproductive: The uterus is poorly visualized. Probable small calcified uterine fibroid. Other: Similar appearance of a 5.2 x 4.8 cm collection in the superficial soft tissues of  the right groin, likely a pseudoaneurysm. Musculoskeletal: Right hip arthroplasty. Osteopenia with degenerative changes of the spine. Lower lumbar fixation hardware. No acute osseous pathology. IMPRESSION: 1. No acute intra-abdominal or pelvic pathology. No hydronephrosis or nephrolithiasis. 2. Severe sigmoid  diverticulosis. No bowel obstruction. Normal appendix. 3. Aortic Atherosclerosis (ICD10-I70.0). Electronically Signed   By: Anner Crete M.D.   On: 10/21/2021 21:57   VAS [place] LOWER EXTREMITY ARTERIAL DUPLEX  Result Date: 10/22/2021 LOWER EXTREMITY ARTERIAL DUPLEX STUDY Patient Name:  KATALAYA BEEL  Date of Exam:   10/22/2021 Medical Rec #: [id number]             Accession #:    [id number] Date of Birth: [birth year]            Patient Gender: F Patient Age:   89 years Exam Location:  [facility] Procedure:      VAS [place] LOWER EXTREMITY ARTERIAL DUPLEX Referring Phys: Jamelle Haring --------------------------------------------------------------------------------  Indications: Peripheral artery disease, and Known active pseudoaneurysm. High Risk Factors: Hypertension, hyperlipidemia.  Vascular               Femoral-femoral bypass grafting in 2011, lost to follow Interventions:         up. 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 Bilateral testing is considered an integral part of a complete examination. Limited examinations for reoccurring indications may be performed as noted.  +----------+--------+-----+--------+----------+--------------------------+  RIGHT      PSV cm/s Ratio Stenosis Waveform   Comments                    +----------+--------+-----+--------+----------+--------------------------+  CFA Prox   96                      monophasic                             +----------+--------+-----+--------+----------+--------------------------+  DFA        26                      monophasic multiple collaterals noted   +----------+--------+-----+--------+----------+--------------------------+  SFA Prox   67                      monophasic                             +----------+--------+-----+--------+----------+--------------------------+  SFA Mid    69                      monophasic                             +----------+--------+-----+--------+----------+--------------------------+  SFA Distal 107                     monophasic                             +----------+--------+-----+--------+----------+--------------------------+  POP Prox   45                      monophasic                             +----------+--------+-----+--------+----------+--------------------------+  POP Mid    49                      monophasic                             +----------+--------+-----+--------+----------+--------------------------+  PTA Prox   46                      monophasic                             +----------+--------+-----+--------+----------+--------------------------+  PTA Mid    20                      monophasic                             +----------+--------+-----+--------+----------+--------------------------+ 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   Summary: Right: Diffuse atherosclerotic plaque noted throughout. 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  See table(s) above for measurements and observations. Electronically signed by Jamelle Haring on 10/22/2021 at 5:00:29 PM.    Final      TODAY-DAY OF DISCHARGE:  Subjective:   Nevin Bloodgood today has no headache,no chest abdominal pain,no new weakness tingling or numbness, feels much better wants to go home today.   Objective:   Blood pressure (!) 119/59, pulse 91, temperature 98.2 F (36.8 C), temperature  source Oral, resp. rate 17, height 5\' 3"  (1.6 m), weight 43.1 kg, SpO2 99 %.  Intake/Output Summary (Last 24 hours) at 11/12/2021 1033 Last data filed at 11/12/2021 0631 Gross per 24 hour  Intake 960 ml  Output 1300 ml  Net -340 ml   Filed Weights   11/02/21 1357  Weight: 43.1 kg    Exam: Awake Alert, Oriented *3, No new F.N deficits, Normal affect [place].AT,PERRAL Supple Neck,No JVD, No cervical lymphadenopathy appriciated.  Symmetrical Chest wall movement, Good air movement bilaterally, CTAB RRR,No Gallops,Rubs or new Murmurs, No Parasternal Heave +ve B.Sounds, Abd Soft, Non tender, No organomegaly appriciated, No rebound -guarding or rigidity. No Cyanosis, Clubbing or edema, No new Rash or bruise   PERTINENT RADIOLOGIC STUDIES: No results found.   PERTINENT LAB RESULTS: CBC: No results for input(s): WBC, HGB, HCT, PLT in the last 72 hours. CMET CMP     Component Value Date/Time   NA 139 11/09/2021 0945   K 5.2 (H) 11/10/2021 0226   CL 105 11/09/2021 0945   CO2 23 11/09/2021 0945   GLUCOSE 265 (H) 11/09/2021 0945   BUN 75 (H) 11/09/2021 0945   CREATININE 3.03 (H) 11/09/2021 0945   CALCIUM 8.9 11/09/2021 0945   PROT 6.8 11/02/2021 1250   ALBUMIN 3.1 (L) 11/02/2021 1250   AST 39 11/02/2021 1250   ALT 16 11/02/2021 1250   ALKPHOS 79 11/02/2021 1250   BILITOT 0.8  11/02/2021 1250   GFRNONAA 15 (L) 11/09/2021 0945   GFRAA 30 (L) 07/10/2020 0137    GFR Estimated Creatinine Clearance: 10.4 mL/min (A) (by C-G formula based on SCr of 3.03 mg/dL (H)). No results for input(s): LIPASE, AMYLASE in the last 72 hours. No results for input(s): CKTOTAL, CKMB, CKMBINDEX, TROPONINI in the last 72 hours. Invalid input(s): POCBNP No results for input(s): DDIMER in the last 72 hours. No results for input(s): HGBA1C in the last 72 hours. No results for input(s): CHOL, HDL, LDLCALC, TRIG, CHOLHDL, LDLDIRECT in the last 72 hours. No results for input(s): TSH, T4TOTAL, T3FREE, THYROIDAB  in the last 72 hours.  Invalid input(s): FREET3 No results for input(s): VITAMINB12, FOLATE, FERRITIN, TIBC, IRON, RETICCTPCT in the last 72 hours. Coags: No results for input(s): INR in the last 72 hours.  Invalid input(s): PT Microbiology: Recent Results (from the past 240 hour(s))  Resp Panel by RT-PCR (Flu A&B, Covid) Nasopharyngeal Swab     Status: None   Collection Time: 11/02/21 11:27 AM   Specimen: Nasopharyngeal Swab; Nasopharyngeal(NP) swabs in vial transport medium  Result Value Ref Range Status   SARS Coronavirus 2 by RT PCR NEGATIVE NEGATIVE Final    Comment: (NOTE) SARS-CoV-2 target nucleic acids are NOT DETECTED.  The SARS-CoV-2 RNA is generally detectable in upper respiratory specimens during the acute phase of infection. The lowest concentration of SARS-CoV-2 viral copies this assay can detect is 138 copies/mL. A negative result does not preclude SARS-Cov-2 infection and should not be used as the sole basis for treatment or other patient management decisions. A negative result may occur with  improper specimen collection/handling, submission of specimen other than nasopharyngeal swab, presence of viral mutation(s) within the areas targeted by this assay, and inadequate number of viral copies(<138 copies/mL). A negative result must be combined with clinical observations, patient history, and epidemiological information. The expected result is Negative.  Fact Sheet for Patients:  EntrepreneurPulse.com.au  Fact Sheet for Healthcare Providers:  IncredibleEmployment.be  This test is no t yet approved or cleared by the [place] FDA and  has been authorized for detection and/or diagnosis of SARS-CoV-2 by FDA under an Emergency Use Authorization (EUA). This EUA will remain  in effect (meaning this test can be used) for the duration of the COVID-19 declaration under Section 564(b)(1) of the Act, 21 U.S.C.section 360bbb-3(b)(1),  unless the authorization is terminated  or revoked sooner.       Influenza A by PCR NEGATIVE NEGATIVE Final   Influenza B by PCR NEGATIVE NEGATIVE Final    Comment: (NOTE) The Xpert Xpress SARS-CoV-2/FLU/RSV plus assay is intended as an aid in the diagnosis of influenza from Nasopharyngeal swab specimens and should not be used as a sole basis for treatment. Nasal washings and aspirates are unacceptable for Xpert Xpress SARS-CoV-2/FLU/RSV testing.  Fact Sheet for Patients: EntrepreneurPulse.com.au  Fact Sheet for Healthcare Providers: IncredibleEmployment.be  This test is not yet approved or cleared by the [place] FDA and has been authorized for detection and/or diagnosis of SARS-CoV-2 by FDA under an Emergency Use Authorization (EUA). This EUA will remain in effect (meaning this test can be used) for the duration of the COVID-19 declaration under Section 564(b)(1) of the Act, 21 U.S.C. section 360bbb-3(b)(1), unless the authorization is terminated or revoked.  Performed at [facility], [place] [address] [place], [place] [address]   Blood Culture (routine x 2)     Status: None   Collection Time: 11/02/21 11:27 AM  Specimen: BLOOD  Result Value Ref Range Status   Specimen Description BLOOD BLOOD LEFT ARM  Final   Special Requests   Final    BOTTLES DRAWN AEROBIC AND ANAEROBIC Blood Culture adequate volume   Culture   Final    NO GROWTH 5 DAYS Performed at [facility], [address], [place], [place] [address]    Report Status 11/07/2021 FINAL  Final  Blood Culture (routine x 2)     Status: None   Collection Time: 11/02/21 11:32 AM   Specimen: Left Antecubital; Blood  Result Value Ref Range Status   Specimen Description LEFT ANTECUBITAL  Final   Special Requests   Final    BOTTLES DRAWN AEROBIC AND ANAEROBIC Blood Culture adequate volume   Culture   Final    NO GROWTH 5 DAYS Performed at [facility],  [place] [address], [place], [place] [address]    Report Status 11/07/2021 FINAL  Final  MRSA Next Gen by PCR, Nasal     Status: None   Collection Time: 11/02/21  3:00 PM   Specimen: Nasal Mucosa; Nasal Swab  Result Value Ref Range Status   MRSA by PCR Next Gen NOT DETECTED NOT DETECTED Final    Comment: (NOTE) The GeneXpert MRSA Assay (FDA approved for NASAL specimens only), is one component of a comprehensive MRSA colonization surveillance program. It is not intended to diagnose MRSA infection nor to guide or monitor treatment for MRSA infections. Test performance is not FDA approved in patients less than 51 years old. Performed at [facility], [place] [address] [place], [place] [address]   Urine Culture     Status: Abnormal   Collection Time: 11/02/21  8:46 PM   Specimen: In/Out Cath Urine  Result Value Ref Range Status   Specimen Description IN/OUT CATH URINE  Final   Special Requests   Final    NONE Performed at [facility], [place] [address] [place], [place] [address]    Culture 2,000 COLONIES/mL STAPHYLOCOCCUS EPIDERMIDIS (A)  Final   Report Status 11/05/2021 FINAL  Final   Organism ID, Bacteria STAPHYLOCOCCUS EPIDERMIDIS (A)  Final      Susceptibility   Staphylococcus epidermidis - MIC*    CIPROFLOXACIN <=0.5 SENSITIVE Sensitive     GENTAMICIN <=0.5 SENSITIVE Sensitive     NITROFURANTOIN <=16 SENSITIVE Sensitive     OXACILLIN >=4 RESISTANT Resistant     TETRACYCLINE <=1 SENSITIVE Sensitive     VANCOMYCIN 1 SENSITIVE Sensitive     TRIMETH/SULFA <=10 SENSITIVE Sensitive     CLINDAMYCIN >=8 RESISTANT Resistant     RIFAMPIN <=0.5 SENSITIVE Sensitive     Inducible Clindamycin NEGATIVE Sensitive     * 2,000 COLONIES/mL STAPHYLOCOCCUS EPIDERMIDIS  Resp Panel by RT-PCR (Flu A&B, Covid) Nasopharyngeal Swab     Status: None   Collection Time: 11/11/21  3:16 PM   Specimen: Nasopharyngeal Swab; Nasopharyngeal(NP) swabs in vial transport medium  Result Value Ref Range Status    SARS Coronavirus 2 by RT PCR NEGATIVE NEGATIVE Final    Comment: (NOTE) SARS-CoV-2 target nucleic acids are NOT DETECTED.  The SARS-CoV-2 RNA is generally detectable in upper respiratory specimens during the acute phase of infection. The lowest concentration of SARS-CoV-2 viral copies this assay can detect is 138 copies/mL. A negative result does not preclude SARS-Cov-2 infection and should not be used as the sole basis for treatment or other patient management decisions. A negative result may occur with  improper  specimen collection/handling, submission of specimen other than nasopharyngeal swab, presence of viral mutation(s) within the areas targeted by this assay, and inadequate number of viral copies(<138 copies/mL). A negative result must be combined with clinical observations, patient history, and epidemiological information. The expected result is Negative.  Fact Sheet for Patients:  EntrepreneurPulse.com.au  Fact Sheet for Healthcare Providers:  IncredibleEmployment.be  This test is no t yet approved or cleared by the [place] FDA and  has been authorized for detection and/or diagnosis of SARS-CoV-2 by FDA under an Emergency Use Authorization (EUA). This EUA will remain  in effect (meaning this test can be used) for the duration of the COVID-19 declaration under Section 564(b)(1) of the Act, 21 U.S.C.section 360bbb-3(b)(1), unless the authorization is terminated  or revoked sooner.       Influenza A by PCR NEGATIVE NEGATIVE Final   Influenza B by PCR NEGATIVE NEGATIVE Final    Comment: (NOTE) The Xpert Xpress SARS-CoV-2/FLU/RSV plus assay is intended as an aid in the diagnosis of influenza from Nasopharyngeal swab specimens and should not be used as a sole basis for treatment. Nasal washings and aspirates are unacceptable for Xpert Xpress SARS-CoV-2/FLU/RSV testing.  Fact Sheet for  Patients: EntrepreneurPulse.com.au  Fact Sheet for Healthcare Providers: IncredibleEmployment.be  This test is not yet approved or cleared by the [place] FDA and has been authorized for detection and/or diagnosis of SARS-CoV-2 by FDA under an Emergency Use Authorization (EUA). This EUA will remain in effect (meaning this test can be used) for the duration of the COVID-19 declaration under Section 564(b)(1) of the Act, 21 U.S.C. section 360bbb-3(b)(1), unless the authorization is terminated or revoked.  Performed at [facility], [place] [address], [place], [place] [address]     FURTHER DISCHARGE INSTRUCTIONS:  Get Medicines reviewed and adjusted: Please take all your medications with you for your next visit with your Primary MD  Laboratory/radiological data: Please request your Primary MD to go over all hospital tests and procedure/radiological results at the follow up, please ask your Primary MD to get all Hospital records sent to his/her office.  In some cases, they will be blood work, cultures and biopsy results pending at the time of your discharge. Please request that your primary care M.D. goes through all the records of your hospital data and follows up on these results.  Also Note the following: If you experience worsening of your admission symptoms, develop shortness of breath, life threatening emergency, suicidal or homicidal thoughts you must seek medical attention immediately by calling 911 or calling your MD immediately  if symptoms less severe.  You must read complete instructions/literature along with all the possible adverse reactions/side effects for all the Medicines you take and that have been prescribed to you. Take any new Medicines after you have completely understood and accpet all the possible adverse reactions/side effects.   Do not drive when taking Pain medications or sleeping medications (Benzodaizepines)  Do  not take more than prescribed Pain, Sleep and Anxiety Medications. It is not advisable to combine anxiety,sleep and pain medications without talking with your primary care practitioner  Special Instructions: If you have smoked or chewed Tobacco  in the last 2 yrs please stop smoking, stop any regular Alcohol  and or any Recreational drug use.  Wear Seat belts while driving.  Please note: You were cared for by a hospitalist during your hospital stay. Once you are discharged, your primary care physician will handle any further medical issues. Please note that  NO REFILLS for any discharge medications will be authorized once you are discharged, as it is imperative that you return to your primary care physician (or establish a relationship with a primary care physician if you do not have one) for your post hospital discharge needs so that they can reassess your need for medications and monitor your lab values.  Total Time spent coordinating discharge including counseling, education and face to face time equals 35 minutes.  SignedOren Binet 11/12/2021 10:33 AM

## 2021-11-09 NOTE — TOC Progression Note (Addendum)
Transition of Care [facility]) - Progression Note    Patient Details  Name: Alyssa [name] MRN: [id number] Date of Birth: Jun 01, 1941  Transition of Care [facility]) CM/SW [place], LCSW Phone Number: 11/09/2021, 12:35 PM  Clinical Narrative:    CSW spoke with patient's son. He is requesting rehab so that patient can get stronger prior to return home. Will request [facility] palliative follow patient. CSW provided SNF bed offers to [place]. He has selected [facility] as patient has been there before. [facility] able to accept patient pending insurance approval; they have started [place] with [employer].    Expected Discharge Plan: [place] Barriers to Discharge: Continued Medical Work up  Expected Discharge Plan and Services Expected Discharge Plan: [place]       Living arrangements for the past 2 months: Single Family Home                                       Social Determinants of Health (SDOH) Interventions    Readmission Risk Interventions Readmission Risk Prevention Plan 11/06/2021 10/09/2021 07/10/2020  Transportation Screening Complete Complete Complete  Medication Review Press photographer) Complete Complete Complete  PCP or Specialist appointment within 3-5 days of discharge Complete Complete -  [place] or Home Care Consult Complete Complete Complete  SW Recovery Care/Counseling Consult Complete Complete Complete  Palliative Care Screening Complete Not Applicable Not [place] Complete Not Applicable Not Applicable  Some recent data might be hidden

## 2021-11-09 NOTE — Plan of Care (Signed)
?  Problem: Education: ?Goal: Knowledge of disease or condition will improve ?Outcome: Progressing ?Goal: Knowledge of the prescribed therapeutic regimen will improve ?Outcome: Progressing ?Goal: Individualized Educational Video(s) ?Outcome: Progressing ?  ?Problem: Activity: ?Goal: Ability to tolerate increased activity will improve ?Outcome: Progressing ?Goal: Will verbalize the importance of balancing activity with adequate rest periods ?Outcome: Progressing ?  ?Problem: Respiratory: ?Goal: Ability to maintain a clear airway will improve ?Outcome: Progressing ?Goal: Levels of oxygenation will improve ?Outcome: Progressing ?Goal: Ability to maintain adequate ventilation will improve ?Outcome: Progressing ?  ?Problem: Activity: ?Goal: Ability to tolerate increased activity will improve ?Outcome: Progressing ?  ?Problem: Clinical Measurements: ?Goal: Ability to maintain a body temperature in the normal range will improve ?Outcome: Progressing ?  ?Problem: Respiratory: ?Goal: Ability to maintain adequate ventilation will improve ?Outcome: Progressing ?Goal: Ability to maintain a clear airway will improve ?Outcome: Progressing ?  ?

## 2021-11-09 NOTE — Progress Notes (Signed)
PROGRESS NOTE    Alyssa [name]  XBM:[id number] DOB: [birth year] DOA: 11/02/2021 PCP: Pcp, No   Chief Complaint  Patient presents with   Shortness of Breath    Brief Narrative:  Alyssa [name] is a 81 y.o. 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  Subjective: No shortness of breath-lying comfortably in bed.  Objective: Vitals: Blood pressure 129/88, pulse 79, temperature (!) 97.3 F (36.3 C), temperature source Oral, resp. rate 19, height 5\' 3"  (1.6 m), weight 43.1 kg, SpO2 100 %.   Exam: Gen Exam:Alert awake-not in any distress HEENT:atraumatic, normocephalic Chest: B/L clear to auscultation anteriorly CVS:S1S2 regular Abdomen:soft non tender, non distended Extremities:no edema Neurology: Non focal Skin: no rash   Assessment & Plan: COPD exacerbation: Improved-hardly any wheezing-stop steroids-continue bronchodilators.    Hyperkalemia: K significantly elevated today-given 1 dose of insulin/D50-change Lokelma to 3 times daily dosing.  Recheck electrolytes tomorrow.    Right groin pseudoaneurysm: Evaluated by vascular surgery during her most recent hospitalization-not a candidate for surgical repair.  Continue supportive care-palliative care evaluation appreciated-Home hospice on discharge.     PAD: Continue aspirin/Plavix/statin-given poor overall state of health/frailty-do not think any further intervention is warranted/indicated.  Stage IV CKD: Renal function stable and close to baseline-has been deemed not a HD candidate per prior notes.  Continues to have intermittent hyperkalemia-on Lokelma.  Normocytic anemia: Due to CKD-no indication for transfusion.   HTN: BP stable-continue Norvasc, hydralazine and Coreg.   HLD: Continue Crestor  Dementia: Appears mild-continue  Depakote/Vistaril and amitriptyline.  Maintain delirium precautions.   Chronic pain: Continue Lyrica and as needed narcotics/Robaxin.    Palliative care: DNR already in place-appears very frail-clearly not a candidate for aggressive care.  Per vascular surgery not a candidate for repair of pseudoaneurysm of her right groin.  Per prior notes-not a candidate for HD.  Per social worker/case manager-family requesting SNF placement.   DVT prophylaxis: Heparin Code Status: DNR Family Communication: None at bedside Disposition:   Status is: Inpatient  Remains inpatient appropriate because: Slowly resolving COPD exacerbation-family requesting SNF placement-insurance authorization/SNF bed placement in progress.      Consultants:  Palliative medicine S   Data Reviewed: I have personally reviewed following labs and imaging studies  CBC: Recent Labs  Lab 11/02/21 1250 11/02/21 1524 11/03/21 0431  WBC 8.3  --  8.6  NEUTROABS 6.4  --   --   HGB 12.7 8.8* 8.9*  HCT 39.9 26.0* 26.7*  MCV 84.4  --  82.9  PLT 352  --  409*     Basic Metabolic Panel: Recent Labs  Lab 11/02/21 1250 11/02/21 1524 11/05/21 0839 11/06/21 0111 11/07/21 0019 11/09/21 0053 11/09/21 0945  NA 142 143 140  --   --   --  139  K 5.8* 5.0 5.5* 5.6* 5.3* 5.9* 5.4*  CL 111  --  106  --   --   --  105  CO2 16*  --  21*  --   --   --  23  GLUCOSE 101*  --  146*  --   --   --  265*  BUN 37*  --  56*  --   --   --  75*  CREATININE 2.73*  --  2.82*  --   --   --  3.03*  CALCIUM 9.2  --  9.0  --   --   --  8.9     GFR: Estimated Creatinine Clearance: 10.4 mL/min (A) (by C-G formula based on SCr of 3.03 mg/dL (H)).  Liver Function Tests: Recent Labs  Lab 11/02/21 1250  AST 39  ALT 16  ALKPHOS 79  BILITOT 0.8  PROT 6.8  ALBUMIN 3.1*     CBG: No results for input(s): GLUCAP in the last 168 hours.   Recent Results (from the past 240 hour(s))  Resp Panel by RT-PCR (Flu A&B, Covid) Nasopharyngeal  Swab     Status: None   Collection Time: 11/02/21 11:27 AM   Specimen: Nasopharyngeal Swab; Nasopharyngeal(NP) swabs in vial transport medium  Result Value Ref Range Status   SARS Coronavirus 2 by RT PCR NEGATIVE NEGATIVE Final    Comment: (NOTE) SARS-CoV-2 target nucleic acids are NOT DETECTED.  The SARS-CoV-2 RNA is generally detectable in upper respiratory specimens during the acute phase of infection. The lowest concentration of SARS-CoV-2 viral copies this assay can detect is 138 copies/mL. A negative result does not preclude SARS-Cov-2 infection and should not be used as the sole basis for treatment or other patient management decisions. A negative result may occur with  improper specimen collection/handling, submission of specimen other than nasopharyngeal swab, presence of viral mutation(s) within the areas targeted by this assay, and inadequate number of viral copies(<138 copies/mL). A negative result must be combined with clinical observations, patient history, and epidemiological information. The expected result is Negative.  Fact Sheet for Patients:  EntrepreneurPulse.com.au  Fact Sheet for Healthcare Providers:  IncredibleEmployment.be  This test is no t yet approved or cleared by the [place] FDA and  has been authorized for detection and/or diagnosis of SARS-CoV-2 by FDA under an Emergency Use Authorization (EUA). This EUA will remain  in effect (meaning this test can be used) for the duration of the COVID-19 declaration under Section 564(b)(1) of the Act, 21 U.S.C.section 360bbb-3(b)(1), unless the authorization is terminated  or revoked sooner.       Influenza A by PCR NEGATIVE NEGATIVE Final   Influenza B by PCR NEGATIVE NEGATIVE Final    Comment: (NOTE) The Xpert Xpress SARS-CoV-2/FLU/RSV plus assay is intended as an aid in the diagnosis of influenza from Nasopharyngeal swab specimens and should not be used as a sole  basis for treatment. Nasal washings and aspirates are unacceptable for Xpert Xpress SARS-CoV-2/FLU/RSV testing.  Fact Sheet for Patients: EntrepreneurPulse.com.au  Fact Sheet for Healthcare Providers: IncredibleEmployment.be  This test is not yet approved or cleared by the [place] FDA and has been authorized for detection and/or diagnosis of SARS-CoV-2 by FDA under an Emergency Use Authorization (EUA). This EUA will remain in effect (meaning this test can be used) for the duration of the COVID-19 declaration under Section 564(b)(1) of the Act, 21 U.S.C. section 360bbb-3(b)(1), unless the authorization is terminated or revoked.  Performed at [facility], [place] [address], [place], [place] [address]   Blood Culture (routine x 2)     Status: None   Collection Time: 11/02/21 11:27 AM   Specimen: BLOOD  Result Value Ref Range Status   Specimen Description BLOOD BLOOD LEFT ARM  Final   Special Requests   Final    BOTTLES DRAWN AEROBIC AND ANAEROBIC Blood Culture adequate volume   Culture   Final    NO GROWTH 5 DAYS Performed at [place] [address], [place], [place] [address]    Report Status 11/07/2021  FINAL  Final  Blood Culture (routine x 2)     Status: None   Collection Time: 11/02/21 11:32 AM   Specimen: Left Antecubital; Blood  Result Value Ref Range Status   Specimen Description LEFT ANTECUBITAL  Final   Special Requests   Final    BOTTLES DRAWN AEROBIC AND ANAEROBIC Blood Culture adequate volume   Culture   Final    NO GROWTH 5 DAYS Performed at [facility], [place] [address] [place], [place] [address]    Report Status 11/07/2021 FINAL  Final  MRSA Next Gen by PCR, Nasal     Status: None   Collection Time: 11/02/21  3:00 PM   Specimen: Nasal Mucosa; Nasal Swab  Result Value Ref Range Status   MRSA by PCR Next Gen NOT DETECTED NOT DETECTED Final    Comment: (NOTE) The GeneXpert MRSA Assay (FDA  approved for NASAL specimens only), is one component of a comprehensive MRSA colonization surveillance program. It is not intended to diagnose MRSA infection nor to guide or monitor treatment for MRSA infections. Test performance is not FDA approved in patients less than 73 years old. Performed at [facility], [place] [address], [place], [place] [address]   Urine Culture     Status: Abnormal   Collection Time: 11/02/21  8:46 PM   Specimen: In/Out Cath Urine  Result Value Ref Range Status   Specimen Description IN/OUT CATH URINE  Final   Special Requests   Final    NONE Performed at [facility], [place] [address], [place], [place] [address]    Culture 2,000 COLONIES/mL STAPHYLOCOCCUS EPIDERMIDIS (A)  Final   Report Status 11/05/2021 FINAL  Final   Organism ID, Bacteria STAPHYLOCOCCUS EPIDERMIDIS (A)  Final      Susceptibility   Staphylococcus epidermidis - MIC*    CIPROFLOXACIN <=0.5 SENSITIVE Sensitive     GENTAMICIN <=0.5 SENSITIVE Sensitive     NITROFURANTOIN <=16 SENSITIVE Sensitive     OXACILLIN >=4 RESISTANT Resistant     TETRACYCLINE <=1 SENSITIVE Sensitive     VANCOMYCIN 1 SENSITIVE Sensitive     TRIMETH/SULFA <=10 SENSITIVE Sensitive     CLINDAMYCIN >=8 RESISTANT Resistant     RIFAMPIN <=0.5 SENSITIVE Sensitive     Inducible Clindamycin NEGATIVE Sensitive     * 2,000 COLONIES/mL STAPHYLOCOCCUS EPIDERMIDIS          Radiology Studies: No results found.      Scheduled Meds:  amitriptyline  10 mg Oral Daily   amLODipine  10 mg Oral Daily   arformoterol  15 mcg Nebulization BID   aspirin EC  81 mg Oral Daily   budesonide (PULMICORT) nebulizer solution  0.25 mg Nebulization BID   carvedilol  3.125 mg Oral BID WC   clopidogrel  75 mg Oral Daily   dicyclomine  20 mg Oral TID   dronabinol  2.5 mg Oral BID AC   feeding supplement (NEPRO CARB STEADY)  237 mL Oral BID BM   ferrous sulfate  325 mg Oral Q breakfast   heparin  5,000 Units Subcutaneous Q12H    hydrALAZINE  50 mg Oral Q8H   hydrOXYzine  25 mg Oral QHS   ipratropium-albuterol  3 mL Nebulization TID   lidocaine  1 patch Transdermal Daily   magnesium oxide  400 mg Oral BID   multivitamin with minerals  1 tablet Oral QHS   polyethylene glycol  17 g Oral Daily   pregabalin  75 mg Oral  Daily   rosuvastatin  40 mg Oral Daily   sodium bicarbonate  1,300 mg Oral TID   sodium zirconium cyclosilicate  10 g Oral TID   umeclidinium bromide  1 puff Inhalation Daily   valproic acid  250 mg Oral QHS   Continuous Infusions:     LOS: 7 days       Oren Binet, MD [facility]   To contact the attending provider between 7A-7P or the covering provider during after hours 7P-7A, please log into the web site [email or web address] and access using universal [place] password for that web site. If you do not have the password, please call the hospital operator.  11/09/2021, 12:44 PM   Patient ID: Alyssa [name], female   DOB: 1941/08/27, 81 y.o.   MRN: [id number]

## 2021-11-09 NOTE — Progress Notes (Signed)
Physical Therapy Treatment Patient Details Name: Alyssa [name] MRN: [id number] DOB: September 15, 1941 Today's Date: 11/09/2021   History of Present Illness pt is a 81 y/o female admitted 1/16 with increasing SOB and worsening weakness.  Work up includes COPD exacerbation, protein calorie malnutrition and CKD.  PMH includes HTN; HLD; PVD; dementia; and CKD.    PT Comments    Pt making steady progress towards her physical therapy goals; requires less assist for bed mobility and transfers this date. Ambulating 80 feet with a Rollator at a min guard assist level. SpO2 96-100%, HR 88-100 bpm. Pt continues from generalized weakness, balance deficits, and decreased endurance. Will benefit from follow up PT to address.    Recommendations for follow up therapy are one component of a multi-disciplinary discharge planning process, led by the attending physician.  Recommendations may be updated based on patient status, additional functional criteria and insurance authorization.  Follow Up Recommendations  Home health PT     Assistance Recommended at Discharge Frequent or constant Supervision/Assistance  Patient can return home with the following A little help with walking and/or transfers;Help with stairs or ramp for entrance   Equipment Recommendations  None recommended by PT    Recommendations for Other Services       Precautions / Restrictions Precautions Precautions: Fall Restrictions Weight Bearing Restrictions: No     Mobility  Bed Mobility Overal bed mobility: Modified Independent                  Transfers Overall transfer level: Needs assistance Equipment used: Rollator (4 wheels) Transfers: Sit to/from Stand Sit to Stand: Supervision           General transfer comment: good power up    Ambulation/Gait Ambulation/Gait assistance: Min guard Gait Distance (Feet): 80 Feet Assistive device: Rollator (4 wheels) Gait Pattern/deviations: Step-through pattern,  Decreased stride length, Trendelenburg Gait velocity: decreased Gait velocity interpretation: <1.8 ft/sec, indicate of risk for recurrent falls   General Gait Details: Pt with Trendelenburg like gait due to leg length discrepancy and pt doesn't have built up shoe present. min guard overall for safety   Stairs             Wheelchair Mobility    Modified Rankin (Stroke Patients Only)       Balance Overall balance assessment: Needs assistance Sitting-balance support: No upper extremity supported, Feet supported Sitting balance-Leahy Scale: Fair     Standing balance support: No upper extremity supported, During functional activity Standing balance-Leahy Scale: Poor                              Cognition Arousal/Alertness: Awake/alert Behavior During Therapy: WFL for tasks assessed/performed Overall Cognitive Status: No family/caregiver present to determine baseline cognitive functioning                                 General Comments: following all commands        Exercises      General Comments        Pertinent Vitals/Pain Pain Assessment Pain Assessment: Faces Faces Pain Scale: No hurt    Home Living                          Prior Function            PT Goals (current goals can now  be found in the care plan section) Acute Rehab PT Goals Patient Stated Goal: did not state Potential to Achieve Goals: Fair Progress towards PT goals: Progressing toward goals    Frequency    Min 3X/week      PT Plan Current plan remains appropriate    Co-evaluation              AM-PAC PT "6 Clicks" Mobility   Outcome Measure  Help needed turning from your back to your side while in a flat bed without using bedrails?: None Help needed moving from lying on your back to sitting on the side of a flat bed without using bedrails?: None Help needed moving to and from a bed to a chair (including a wheelchair)?: A  Little Help needed standing up from a chair using your arms (e.g., wheelchair or bedside chair)?: A Little Help needed to walk in hospital room?: A Little Help needed climbing 3-5 steps with a railing? : A Lot 6 Click Score: 19    End of Session Equipment Utilized During Treatment: Gait belt Activity Tolerance: Patient tolerated treatment well Patient left: in bed;with call bell/phone within reach;with bed alarm set Nurse Communication: Mobility status PT Visit Diagnosis: Other abnormalities of gait and mobility (R26.89);Muscle weakness (generalized) (M62.81);Difficulty in walking, not elsewhere classified (R26.2)     Time: 8616-8372 PT Time Calculation (min) (ACUTE ONLY): 25 min  Charges:  $Therapeutic Activity: 23-37 mins                     Wyona Almas, PT, DPT Acute Rehabilitation Services Pager [phone] Office [phone]    Deno Etienne 11/09/2021, 2:22 PM

## 2021-11-10 LAB — POTASSIUM: Potassium: 5.2 mmol/L — ABNORMAL HIGH (ref 3.5–5.1)

## 2021-11-10 NOTE — Progress Notes (Signed)
PROGRESS NOTE    Alyssa [name]  AJO:[id number] DOB: 21-Jan-1941 DOA: 11/02/2021 PCP: Pcp, No   Chief Complaint  Patient presents with   Shortness of Breath    Brief Narrative:  Alyssa [name] is a 81 y.o. 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  Subjective: Lying comfortably in bed-no chest pain or shortness of breath.  Objective: Vitals: Blood pressure 135/73, pulse 81, temperature 97.8 F (36.6 C), temperature source Oral, resp. rate 15, height 5\' 3"  (1.6 m), weight 43.1 kg, SpO2 98 %.   Exam: Gen Exam:Alert awake-not in any distress HEENT:atraumatic, normocephalic Chest: B/L clear to auscultation anteriorly CVS:S1S2 regular Abdomen:soft non tender, non distended Extremities:no edema Neurology: Non focal Skin: no rash   Assessment & Plan: COPD exacerbation: Improved-hardly any wheezing-no longer on steroids-continue bronchodilators.    Hyperkalemia: Potassium better-continue Lokelma.  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  Right groin pseudoaneurysm: Evaluated by vascular surgery during her most recent hospitalization-not a candidate for surgical repair.  Continue supportive care-palliative care evaluation appreciated-Home hospice on discharge.     PAD: Continue aspirin/Plavix/statin-given poor overall state of health/frailty-do not think any further intervention is warranted/indicated.  Stage IV CKD: Renal function stable and close to baseline-has been deemed not a HD candidate per prior notes.  Continues to have intermittent hyperkalemia-on Lokelma.  Normocytic anemia: Due to CKD-no indication for transfusion.   HTN: BP stable-continue Norvasc, hydralazine and Coreg.   HLD: Continue Crestor  Dementia: Appears mild-continue Depakote/Vistaril and  amitriptyline.  Maintain delirium precautions.   Chronic pain: Continue Lyrica and as needed narcotics/Robaxin.    Palliative care: DNR already in place-appears very frail-clearly not a candidate for aggressive care.  Per vascular surgery not a candidate for repair of pseudoaneurysm of her right groin.  Per prior notes-not a candidate for HD.  Per social worker/case manager-family requesting SNF placement.   DVT prophylaxis: Heparin Code Status: DNR Family Communication: None at bedside Disposition:   Status is: Inpatient  Remains inpatient appropriate because: Slowly resolving COPD exacerbation-family requesting SNF placement-insurance authorization/SNF bed placement in progress.      Consultants:  Palliative medicine S   Data Reviewed: I have personally reviewed following labs and imaging studies  CBC: No results for input(s): WBC, NEUTROABS, HGB, HCT, MCV, PLT in the last 168 hours.   Basic Metabolic Panel: Recent Labs  Lab 11/05/21 0839 11/06/21 0111 11/07/21 0019 11/09/21 0053 11/09/21 0945 11/10/21 0226  NA 140  --   --   --  139  --   K 5.5* 5.6* 5.3* 5.9* 5.4* 5.2*  CL 106  --   --   --  105  --   CO2 21*  --   --   --  23  --   GLUCOSE 146*  --   --   --  265*  --   BUN 56*  --   --   --  75*  --   CREATININE 2.82*  --   --   --  3.03*  --   CALCIUM 9.0  --   --   --  8.9  --      GFR: Estimated Creatinine Clearance: 10.4 mL/min (A) (by C-G formula based on SCr of 3.03 mg/dL (H)).  Liver Function Tests: No results for input(s): AST, ALT, ALKPHOS, BILITOT, PROT,  ALBUMIN in the last 168 hours.   CBG: No results for input(s): GLUCAP in the last 168 hours.   Recent Results (from the past 240 hour(s))  Resp Panel by RT-PCR (Flu A&B, Covid) Nasopharyngeal Swab     Status: None   Collection Time: 11/02/21 11:27 AM   Specimen: Nasopharyngeal Swab; Nasopharyngeal(NP) swabs in vial transport medium  Result Value Ref Range Status   SARS Coronavirus 2 by  RT PCR NEGATIVE NEGATIVE Final    Comment: (NOTE) SARS-CoV-2 target nucleic acids are NOT DETECTED.  The SARS-CoV-2 RNA is generally detectable in upper respiratory specimens during the acute phase of infection. The lowest concentration of SARS-CoV-2 viral copies this assay can detect is 138 copies/mL. A negative result does not preclude SARS-Cov-2 infection and should not be used as the sole basis for treatment or other patient management decisions. A negative result may occur with  improper specimen collection/handling, submission of specimen other than nasopharyngeal swab, presence of viral mutation(s) within the areas targeted by this assay, and inadequate number of viral copies(<138 copies/mL). A negative result must be combined with clinical observations, patient history, and epidemiological information. The expected result is Negative.  Fact Sheet for Patients:  EntrepreneurPulse.com.au  Fact Sheet for Healthcare Providers:  IncredibleEmployment.be  This test is no t yet approved or cleared by the [place] FDA and  has been authorized for detection and/or diagnosis of SARS-CoV-2 by FDA under an Emergency Use Authorization (EUA). This EUA will remain  in effect (meaning this test can be used) for the duration of the COVID-19 declaration under Section 564(b)(1) of the Act, 21 U.S.C.section 360bbb-3(b)(1), unless the authorization is terminated  or revoked sooner.       Influenza A by PCR NEGATIVE NEGATIVE Final   Influenza B by PCR NEGATIVE NEGATIVE Final    Comment: (NOTE) The Xpert Xpress SARS-CoV-2/FLU/RSV plus assay is intended as an aid in the diagnosis of influenza from Nasopharyngeal swab specimens and should not be used as a sole basis for treatment. Nasal washings and aspirates are unacceptable for Xpert Xpress SARS-CoV-2/FLU/RSV testing.  Fact Sheet for Patients: EntrepreneurPulse.com.au  Fact Sheet  for Healthcare Providers: IncredibleEmployment.be  This test is not yet approved or cleared by the [place] FDA and has been authorized for detection and/or diagnosis of SARS-CoV-2 by FDA under an Emergency Use Authorization (EUA). This EUA will remain in effect (meaning this test can be used) for the duration of the COVID-19 declaration under Section 564(b)(1) of the Act, 21 U.S.C. section 360bbb-3(b)(1), unless the authorization is terminated or revoked.  Performed at [facility], [place] [address], [place], [place] [address]   Blood Culture (routine x 2)     Status: None   Collection Time: 11/02/21 11:27 AM   Specimen: BLOOD  Result Value Ref Range Status   Specimen Description BLOOD BLOOD LEFT ARM  Final   Special Requests   Final    BOTTLES DRAWN AEROBIC AND ANAEROBIC Blood Culture adequate volume   Culture   Final    NO GROWTH 5 DAYS Performed at [facility] [address], [place], [place] [address]    Report Status 11/07/2021 FINAL  Final  Blood Culture (routine x 2)     Status: None   Collection Time: 11/02/21 11:32 AM   Specimen: Left Antecubital; Blood  Result Value Ref Range Status   Specimen Description LEFT ANTECUBITAL  Final   Special Requests   Final    BOTTLES DRAWN AEROBIC AND  ANAEROBIC Blood Culture adequate volume   Culture   Final    NO GROWTH 5 DAYS Performed at [facility], [place] [address] [place], [place] [address]    Report Status 11/07/2021 FINAL  Final  MRSA Next Gen by PCR, Nasal     Status: None   Collection Time: 11/02/21  3:00 PM   Specimen: Nasal Mucosa; Nasal Swab  Result Value Ref Range Status   MRSA by PCR Next Gen NOT DETECTED NOT DETECTED Final    Comment: (NOTE) The GeneXpert MRSA Assay (FDA approved for NASAL specimens only), is one component of a comprehensive MRSA colonization surveillance program. It is not intended to diagnose MRSA infection nor to guide or monitor treatment for MRSA  infections. Test performance is not FDA approved in patients less than 71 years old. Performed at [facility], [place] [address], [place], [place] [address]   Urine Culture     Status: Abnormal   Collection Time: 11/02/21  8:46 PM   Specimen: In/Out Cath Urine  Result Value Ref Range Status   Specimen Description IN/OUT CATH URINE  Final   Special Requests   Final    NONE Performed at [facility], [place] [address], [place], [place] [address]    Culture 2,000 COLONIES/mL STAPHYLOCOCCUS EPIDERMIDIS (A)  Final   Report Status 11/05/2021 FINAL  Final   Organism ID, Bacteria STAPHYLOCOCCUS EPIDERMIDIS (A)  Final      Susceptibility   Staphylococcus epidermidis - MIC*    CIPROFLOXACIN <=0.5 SENSITIVE Sensitive     GENTAMICIN <=0.5 SENSITIVE Sensitive     NITROFURANTOIN <=16 SENSITIVE Sensitive     OXACILLIN >=4 RESISTANT Resistant     TETRACYCLINE <=1 SENSITIVE Sensitive     VANCOMYCIN 1 SENSITIVE Sensitive     TRIMETH/SULFA <=10 SENSITIVE Sensitive     CLINDAMYCIN >=8 RESISTANT Resistant     RIFAMPIN <=0.5 SENSITIVE Sensitive     Inducible Clindamycin NEGATIVE Sensitive     * 2,000 COLONIES/mL STAPHYLOCOCCUS EPIDERMIDIS          Radiology Studies: No results found.      Scheduled Meds:  amitriptyline  10 mg Oral Daily   amLODipine  10 mg Oral Daily   arformoterol  15 mcg Nebulization BID   aspirin EC  81 mg Oral Daily   budesonide (PULMICORT) nebulizer solution  0.25 mg Nebulization BID   carvedilol  3.125 mg Oral BID WC   clopidogrel  75 mg Oral Daily   dicyclomine  20 mg Oral TID   dronabinol  2.5 mg Oral BID AC   feeding supplement (NEPRO CARB STEADY)  237 mL Oral BID BM   ferrous sulfate  325 mg Oral Q breakfast   heparin  5,000 Units Subcutaneous Q12H   hydrALAZINE  50 mg Oral Q8H   hydrOXYzine  25 mg Oral QHS   ipratropium-albuterol  3 mL Nebulization TID   lidocaine  1 patch Transdermal Daily   magnesium oxide  400 mg Oral BID   multivitamin  with minerals  1 tablet Oral QHS   polyethylene glycol  17 g Oral Daily   pregabalin  75 mg Oral Daily   rosuvastatin  40 mg Oral Daily   sodium bicarbonate  1,300 mg Oral TID   sodium zirconium cyclosilicate  10 g Oral TID   umeclidinium bromide  1 puff Inhalation Daily   valproic acid  250 mg Oral QHS   Continuous Infusions:     LOS: 8  days       Oren Binet, MD [facility]   To contact the attending provider between 7A-7P or the covering provider during after hours 7P-7A, please log into the web site [email or web address] and access using universal [place] password for that web site. If you do not have the password, please call the hospital operator.  11/10/2021, 12:30 PM   Patient ID: [name], female   DOB: 11/06/1940, 81 y.o.   MRN: [id number]

## 2021-11-10 NOTE — Progress Notes (Signed)
[facility]) Hospital Liaison Note  [facility] following for disposition. Please call with questions or concerns   [facility] Liaison [phone]

## 2021-11-10 NOTE — Plan of Care (Signed)
?  Problem: Education: ?Goal: Knowledge of disease or condition will improve ?Outcome: Progressing ?Goal: Knowledge of the prescribed therapeutic regimen will improve ?Outcome: Progressing ?Goal: Individualized Educational Video(s) ?Outcome: Progressing ?  ?Problem: Activity: ?Goal: Ability to tolerate increased activity will improve ?Outcome: Progressing ?Goal: Will verbalize the importance of balancing activity with adequate rest periods ?Outcome: Progressing ?  ?Problem: Respiratory: ?Goal: Ability to maintain a clear airway will improve ?Outcome: Progressing ?Goal: Levels of oxygenation will improve ?Outcome: Progressing ?Goal: Ability to maintain adequate ventilation will improve ?Outcome: Progressing ?  ?Problem: Activity: ?Goal: Ability to tolerate increased activity will improve ?Outcome: Progressing ?  ?Problem: Clinical Measurements: ?Goal: Ability to maintain a body temperature in the normal range will improve ?Outcome: Progressing ?  ?Problem: Respiratory: ?Goal: Ability to maintain adequate ventilation will improve ?Outcome: Progressing ?Goal: Ability to maintain a clear airway will improve ?Outcome: Progressing ?  ?

## 2021-11-10 NOTE — Progress Notes (Signed)
Occupational Therapy Treatment Patient Details Name: Alyssa [name] MRN: [id number] DOB: 07/01/1941 Today's Date: 11/10/2021   History of present illness pt is a 81 y/o female admitted 1/16 with increasing SOB and worsening weakness.  Work up includes COPD exacerbation, protein calorie malnutrition and CKD.  PMH includes HTN; HLD; PVD; dementia; and CKD.   OT comments  Patient received sitting in recliner and agreeable to OT session.  Patient was min assist to stand and to ambulate to sink with patient demonstrating poor safety, letting go of walker before getting to sink and not reaching back to sit. Patient was able to stand for oral care but required seated break before performing hand and face hygiene.  Patient performed UB bathing and gown change seated.  When ambulating back to recliner patient attempted to walk without walker and instructed on safety.  Acute OT to continue to follow.    Recommendations for follow up therapy are one component of a multi-disciplinary discharge planning process, led by the attending physician.  Recommendations may be updated based on patient status, additional functional criteria and insurance authorization.    Follow Up Recommendations  Home health OT (may need 24 hour assitance then SNF recommended)    Assistance Recommended at Discharge Intermittent Supervision/Assistance  Patient can return home with the following  Assistance with cooking/housework;Direct supervision/assist for medications management;Direct supervision/assist for financial management;Assist for transportation;Help with stairs or ramp for entrance;A lot of help with bathing/dressing/bathroom;A lot of help with walking and/or transfers   Equipment Recommendations  Hospital bed    Recommendations for Other Services      Precautions / Restrictions Precautions Precautions: Fall Precaution Comments: Watch HR; urinary incontinence       Mobility Bed Mobility                General bed mobility comments: seated in recliner upon entry    Transfers Overall transfer level: Needs assistance Equipment used: Rolling walker (2 wheels) Transfers: Sit to/from Stand Sit to Stand: Min assist     Step pivot transfers: Min assist     General transfer comment: required assistance today to power up and required assistance for balance during transfers     Balance Overall balance assessment: Needs assistance Sitting-balance support: No upper extremity supported, Feet supported Sitting balance-Leahy Scale: Fair Sitting balance - Comments: seated in recliner   Standing balance support: Single extremity supported, Bilateral upper extremity supported, During functional activity Standing balance-Leahy Scale: Poor Standing balance comment: required seated rest break between oral hygiene and face/hand hygiene.  bathing and dressing performed seated                           ADL either performed or assessed with clinical judgement   ADL Overall ADL's : Needs assistance/impaired     Grooming: Wash/dry hands;Wash/dry face;Oral care;Minimal assistance;Standing Grooming Details (indicate cue type and reason): performed standing at sink with rest following oral care then standing for face and hand hygiene.  Min assist for balance and safety Upper Body Bathing: Minimal assistance;Sitting       Upper Body Dressing : Minimal assistance;Sitting Upper Body Dressing Details (indicate cue type and reason): performed seated                 Functional mobility during ADLs: Minimal assistance;Rolling walker (2 wheels) General ADL Comments: demonstrated poor safety ambulating to sink and during transfers    Extremity/Trunk Assessment  Vision       Perception     Praxis      Cognition Arousal/Alertness: Awake/alert Behavior During Therapy: WFL for tasks assessed/performed Overall Cognitive Status: No family/caregiver present to  determine baseline cognitive functioning                                 General Comments: demonstrated poor safety.  oriented on year but not day or month.        Exercises      Shoulder Instructions       General Comments      Pertinent Vitals/ Pain       Pain Assessment Pain Assessment: Faces Faces Pain Scale: Hurts even more Pain Location: back, left hip Pain Descriptors / Indicators: Aching, Discomfort, Grimacing Pain Intervention(s): Monitored during session, Repositioned, Patient requesting pain meds-RN notified  Home Living                                          Prior Functioning/Environment              Frequency  Min 2X/week        Progress Toward Goals  OT Goals(current goals can now be found in the care plan section)  Progress towards OT goals: Progressing toward goals  Acute Rehab OT Goals Patient Stated Goal: get better OT Goal Formulation: With patient Time For Goal Achievement: 11/19/21 Potential to Achieve Goals: Good ADL Goals Pt Will Perform Grooming: with supervision;with set-up;sitting Pt Will Perform Upper Body Bathing: with min guard assist;with supervision;sitting Pt Will Perform Lower Body Bathing: with mod assist;with min assist;sitting/lateral leans Pt Will Perform Upper Body Dressing: with min guard assist;with supervision;sitting Pt Will Transfer to Toilet: with min assist;with min guard assist;ambulating;stand pivot transfer Pt Will Perform Toileting - Clothing Manipulation and hygiene: with min assist;with min guard assist;sit to/from stand  Plan Discharge plan remains appropriate    Co-evaluation                 AM-PAC OT "6 Clicks" Daily Activity     Outcome Measure   Help from another person eating meals?: None Help from another person taking care of personal grooming?: A Little Help from another person toileting, which includes using toliet, bedpan, or urinal?: A Lot Help  from another person bathing (including washing, rinsing, drying)?: A Lot Help from another person to put on and taking off regular upper body clothing?: A Little Help from another person to put on and taking off regular lower body clothing?: A Little 6 Click Score: 17    End of Session Equipment Utilized During Treatment: Gait belt;Rolling walker (2 wheels)  OT Visit Diagnosis: Unsteadiness on feet (R26.81);Muscle weakness (generalized) (M62.81);Pain;Other abnormalities of gait and mobility (R26.89) Pain - Right/Left: Left Pain - part of body: Hip   Activity Tolerance Patient limited by pain   Patient Left in chair;with call bell/phone within reach;with chair alarm set   Nurse Communication Mobility status;Patient requests pain meds        Time: 9417-4081 OT Time Calculation (min): 21 min  Charges: OT General Charges $OT Visit: 1 Visit OT Treatments $Self Care/Home Management : 8-22 mins  Lodema Hong, OTA Acute Rehabilitation Services  Pager [phone] Office [place] 11/10/2021, 11:15 AM

## 2021-11-11 LAB — RESP PANEL BY RT-PCR (FLU A&B, COVID) ARPGX2
Influenza A by PCR: NEGATIVE
Influenza B by PCR: NEGATIVE
SARS Coronavirus 2 by RT PCR: NEGATIVE

## 2021-11-11 MED ORDER — ENSURE ENLIVE PO LIQD
237.0000 mL | Freq: Two times a day (BID) | ORAL | Status: DC
  Administered 2021-11-11 – 2021-11-12 (×2): 237 mL via ORAL

## 2021-11-11 NOTE — Progress Notes (Signed)
Physical Therapy Treatment Patient Details Name: Alyssa [name] MRN: [id number] DOB: [birth year] Today's Date: 11/11/2021   History of Present Illness pt is a 81 y/o female admitted 1/16 with increasing SOB and worsening weakness.  Work up includes COPD exacerbation, protein calorie malnutrition and CKD.  PMH includes HTN; HLD; PVD; dementia; and CKD.    PT Comments    Patient reporting back pain on arrival and unable to persuade her to get OOB. Session focused on bed level exercises. Patient required AAROM for RLE for most exercises and performed AROM with LLE.   Recommendations for follow up therapy are one component of a multi-disciplinary discharge planning process, led by the attending physician.  Recommendations may be updated based on patient status, additional functional criteria and insurance authorization.  Follow Up Recommendations  Home health PT     Assistance Recommended at Discharge Frequent or constant Supervision/Assistance  Patient can return home with the following A little help with walking and/or transfers;Help with stairs or ramp for entrance   Equipment Recommendations  None recommended by PT    Recommendations for Other Services       Precautions / Restrictions Precautions Precautions: Fall Precaution Comments: Watch HR; urinary incontinence     Mobility  Bed Mobility               General bed mobility comments: pt refused OOB due to current back pain; agreed to bed level exercises    Transfers                        Ambulation/Gait                   Stairs             Wheelchair Mobility    Modified Rankin (Stroke Patients Only)       Balance                                            Cognition Arousal/Alertness: Awake/alert Behavior During Therapy: WFL for tasks assessed/performed Overall Cognitive Status: No family/caregiver present to determine baseline cognitive functioning                                  General Comments: followed simple commands for exercises        Exercises General Exercises - Lower Extremity Ankle Circles/Pumps: AROM, Left, AAROM, Right, 10 reps, Supine Quad Sets: AROM, Both, 10 reps Short Arc Quad: AROM, Left, AAROM, Right, 10 reps Heel Slides: AROM, Strengthening, Both, 10 reps (resisted extension) Hip ABduction/ADduction: AROM, Left, AAROM, Right, 10 reps, Supine Straight Leg Raises: AROM, Left, AAROM, Right, 10 reps    General Comments        Pertinent Vitals/Pain Pain Assessment Pain Assessment: Faces Faces Pain Scale: Hurts whole lot Pain Location: back Pain Descriptors / Indicators: Aching, Discomfort, Grimacing Pain Intervention(s): Limited activity within patient's tolerance, Monitored during session, Repositioned, Patient requesting pain meds-RN notified    Home Living                          Prior Function            PT Goals (current goals can now be found in the care plan section) Acute Rehab  PT Goals Patient Stated Goal: did not state PT Goal Formulation: With patient Time For Goal Achievement: 11/18/21 Potential to Achieve Goals: Fair Progress towards PT goals: Progressing toward goals    Frequency    Min 3X/week      PT Plan Current plan remains appropriate    Co-evaluation              AM-PAC PT "6 Clicks" Mobility   Outcome Measure  Help needed turning from your back to your side while in a flat bed without using bedrails?: None Help needed moving from lying on your back to sitting on the side of a flat bed without using bedrails?: None Help needed moving to and from a bed to a chair (including a wheelchair)?: A Little Help needed standing up from a chair using your arms (e.g., wheelchair or bedside chair)?: A Little Help needed to walk in hospital room?: A Little Help needed climbing 3-5 steps with a railing? : A Lot 6 Click Score: 19    End of  Session   Activity Tolerance: Patient limited by pain (even after ROM exercises; refused OOB) Patient left: in bed;with call bell/phone within reach;with bed alarm set Nurse Communication: Patient requests pain meds (refused OOB) PT Visit Diagnosis: Other abnormalities of gait and mobility (R26.89);Muscle weakness (generalized) (M62.81);Difficulty in walking, not elsewhere classified (R26.2)     Time: 4801-6553 PT Time Calculation (min) (ACUTE ONLY): 12 min  Charges:  $Therapeutic Exercise: 8-22 mins                      Alyssa [name], PT Acute Rehabilitation Services  Pager [phone] Office [phone]    Alyssa [name] 11/11/2021, 11:32 AM

## 2021-11-11 NOTE — Plan of Care (Signed)
?  Problem: Education: ?Goal: Knowledge of disease or condition will improve ?Outcome: Progressing ?Goal: Knowledge of the prescribed therapeutic regimen will improve ?Outcome: Progressing ?Goal: Individualized Educational Video(s) ?Outcome: Progressing ?  ?Problem: Activity: ?Goal: Ability to tolerate increased activity will improve ?Outcome: Progressing ?Goal: Will verbalize the importance of balancing activity with adequate rest periods ?Outcome: Progressing ?  ?Problem: Respiratory: ?Goal: Ability to maintain a clear airway will improve ?Outcome: Progressing ?Goal: Levels of oxygenation will improve ?Outcome: Progressing ?Goal: Ability to maintain adequate ventilation will improve ?Outcome: Progressing ?  ?Problem: Activity: ?Goal: Ability to tolerate increased activity will improve ?Outcome: Progressing ?  ?Problem: Clinical Measurements: ?Goal: Ability to maintain a body temperature in the normal range will improve ?Outcome: Progressing ?  ?Problem: Respiratory: ?Goal: Ability to maintain adequate ventilation will improve ?Outcome: Progressing ?Goal: Ability to maintain a clear airway will improve ?Outcome: Progressing ?  ?

## 2021-11-11 NOTE — Progress Notes (Signed)
Nutrition Follow-up  DOCUMENTATION CODES:   Severe malnutrition in context of chronic illness, Underweight  INTERVENTION:   Discontinue Nepro Continue MVI with minerals daily Ensure Enlive po BID, each supplement provides 350 kcal and 20 grams of protein  NUTRITION DIAGNOSIS:   Severe Malnutrition related to chronic illness (COPD, CKD stage IV) as evidenced by severe fat depletion, severe muscle depletion. - Ongoing  GOAL:   Patient will meet greater than or equal to 90% of their needs - Ongoing  MONITOR:   PO intake, Supplement acceptance, Labs  REASON FOR ASSESSMENT:   Consult Assessment of nutrition requirement/status, Calorie Count  ASSESSMENT:   Pt admitted with SOB secondary to COPD exacerbation and PNA. PMH includes failure to thrive, CKD stage IV, COPD, HTN, HLD and R groin pseudoaneurysm. Pt with multiple prior hospitalization with most previous being 1/4-1/7 for pyelonephritis and was d/c with home hospice.  Pt sleeping at time of visit, wakes to RD voice.  Pt reports that she is doing ok. Reports that she has had some nausea and vomiting today. Pt reports that she is not drinking the Nepro because she does not like them. Per EMR, pt intake includes: 1/20: Breakfast 70%, Dinner 100% 1/24: Dinner 50%  Spoke with RN outside of room. RN reports that pt is doing a lot better than yesterday, yesterday pt was not feeling good. RN reports that he informed the MD that she does not like the Nepro shakes.   Medications reviewed and include: Marinol, Ferrous Sulfate, Magnesium Oxide, MVI, Miralax, Lokelma Labs reviewed: Potassium 5.2   Diet Order:   Diet Order             Diet regular Room service appropriate? Yes; Fluid consistency: Thin  Diet effective now                   EDUCATION NEEDS:   No education needs have been identified at this time  Skin:  Skin Assessment: Reviewed RN Assessment  Last BM:  1/22  Height:   Ht Readings from Last 1  Encounters:  11/02/21 5\' 3"  (1.6 m)    Weight:   Wt Readings from Last 1 Encounters:  11/02/21 43.1 kg    BMI:  Body mass index is 16.83 kg/m.  Estimated Nutritional Needs:   Kcal:  1600-1800  Protein:  70-85g  Fluid:  >/=1.6L    Ceniya Fowers Louie Casa, RD, LDN Clinical Dietitian See [facility] for contact information.

## 2021-11-11 NOTE — Progress Notes (Signed)
PROGRESS NOTE    Alyssa [name]  PJA:[id number] DOB: [birth year] DOA: 11/02/2021 PCP: Pcp, No   Chief Complaint  Patient presents with   Shortness of Breath    Brief Narrative:  Alyssa [name] is a 81 y.o. 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  Subjective: Lying comfortably in bed-no major issues overnight.  Objective: Vitals: Blood pressure 133/72, pulse 77, temperature (!) 97.1 F (36.2 C), temperature source Axillary, resp. rate 13, height 5\' 3"  (1.6 m), weight 43.1 kg, SpO2 98 %.   Exam: Gen Exam:Alert awake-not in any distress HEENT:atraumatic, normocephalic Chest: B/L clear to auscultation anteriorly CVS:S1S2 regular Abdomen:soft non tender, non distended Extremities:no edema Neurology: Non focal Skin: no rash   Assessment & Plan: COPD exacerbation: Improved-hardly any wheezing-no longer on steroids-continue bronchodilators.    Hyperkalemia: Potassium better-continue Lokelma.  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  Right groin pseudoaneurysm: Evaluated by vascular surgery during her most recent hospitalization-not a candidate for surgical repair.  Continue supportive care-palliative care evaluation appreciated-Home hospice on discharge.     PAD: Continue aspirin/Plavix/statin-given poor overall state of health/frailty-do not think any further intervention is warranted/indicated.  Stage IV CKD: Renal function stable and close to baseline-has been deemed not a HD candidate per prior notes.  Continues to have intermittent hyperkalemia-on Lokelma.  Normocytic anemia: Due to CKD-no indication for transfusion.   HTN: BP stable-continue Norvasc, hydralazine and Coreg.   HLD: Continue Crestor  Dementia: Appears mild-continue Depakote/Vistaril and  amitriptyline.  Maintain delirium precautions.   Chronic pain: Continue Lyrica and as needed narcotics/Robaxin.    Palliative care: DNR already in place-appears very frail-clearly not a candidate for aggressive care.  Per vascular surgery not a candidate for repair of pseudoaneurysm of her right groin.  Per prior notes-not a candidate for HD.  Per social worker/case manager-family requesting SNF placement.   DVT prophylaxis: Heparin Code Status: DNR Family Communication: None at bedside Disposition:   Status is: Inpatient  Remains inpatient appropriate because: Slowly resolving COPD exacerbation-family requesting SNF placement-insurance authorization/SNF bed placement in progress.      Consultants:  Palliative medicine S   Data Reviewed: I have personally reviewed following labs and imaging studies  CBC: No results for input(s): WBC, NEUTROABS, HGB, HCT, MCV, PLT in the last 168 hours.   Basic Metabolic Panel: Recent Labs  Lab 11/05/21 0839 11/06/21 0111 11/07/21 0019 11/09/21 0053 11/09/21 0945 11/10/21 0226  NA 140  --   --   --  139  --   K 5.5* 5.6* 5.3* 5.9* 5.4* 5.2*  CL 106  --   --   --  105  --   CO2 21*  --   --   --  23  --   GLUCOSE 146*  --   --   --  265*  --   BUN 56*  --   --   --  75*  --   CREATININE 2.82*  --   --   --  3.03*  --   CALCIUM 9.0  --   --   --  8.9  --      GFR: Estimated Creatinine Clearance: 10.4 mL/min (A) (by C-G formula based on SCr of 3.03 mg/dL (H)).  Liver Function Tests: No results for input(s): AST, ALT, ALKPHOS, BILITOT, PROT, ALBUMIN in  the last 168 hours.   CBG: No results for input(s): GLUCAP in the last 168 hours.   Recent Results (from the past 240 hour(s))  Resp Panel by RT-PCR (Flu A&B, Covid) Nasopharyngeal Swab     Status: None   Collection Time: 11/02/21 11:27 AM   Specimen: Nasopharyngeal Swab; Nasopharyngeal(NP) swabs in vial transport medium  Result Value Ref Range Status   SARS Coronavirus 2 by  RT PCR NEGATIVE NEGATIVE Final    Comment: (NOTE) SARS-CoV-2 target nucleic acids are NOT DETECTED.  The SARS-CoV-2 RNA is generally detectable in upper respiratory specimens during the acute phase of infection. The lowest concentration of SARS-CoV-2 viral copies this assay can detect is 138 copies/mL. A negative result does not preclude SARS-Cov-2 infection and should not be used as the sole basis for treatment or other patient management decisions. A negative result may occur with  improper specimen collection/handling, submission of specimen other than nasopharyngeal swab, presence of viral mutation(s) within the areas targeted by this assay, and inadequate number of viral copies(<138 copies/mL). A negative result must be combined with clinical observations, patient history, and epidemiological information. The expected result is Negative.  Fact Sheet for Patients:  EntrepreneurPulse.com.au  Fact Sheet for Healthcare Providers:  IncredibleEmployment.be  This test is no t yet approved or cleared by the [place] FDA and  has been authorized for detection and/or diagnosis of SARS-CoV-2 by FDA under an Emergency Use Authorization (EUA). This EUA will remain  in effect (meaning this test can be used) for the duration of the COVID-19 declaration under Section 564(b)(1) of the Act, 21 U.S.C.section 360bbb-3(b)(1), unless the authorization is terminated  or revoked sooner.       Influenza A by PCR NEGATIVE NEGATIVE Final   Influenza B by PCR NEGATIVE NEGATIVE Final    Comment: (NOTE) The Xpert Xpress SARS-CoV-2/FLU/RSV plus assay is intended as an aid in the diagnosis of influenza from Nasopharyngeal swab specimens and should not be used as a sole basis for treatment. Nasal washings and aspirates are unacceptable for Xpert Xpress SARS-CoV-2/FLU/RSV testing.  Fact Sheet for Patients: EntrepreneurPulse.com.au  Fact Sheet  for Healthcare Providers: IncredibleEmployment.be  This test is not yet approved or cleared by the [place] FDA and has been authorized for detection and/or diagnosis of SARS-CoV-2 by FDA under an Emergency Use Authorization (EUA). This EUA will remain in effect (meaning this test can be used) for the duration of the COVID-19 declaration under Section 564(b)(1) of the Act, 21 U.S.C. section 360bbb-3(b)(1), unless the authorization is terminated or revoked.  Performed at [facility], [place] [address] [place], [place] [address]   Blood Culture (routine x 2)     Status: None   Collection Time: 11/02/21 11:27 AM   Specimen: BLOOD  Result Value Ref Range Status   Specimen Description BLOOD BLOOD LEFT ARM  Final   Special Requests   Final    BOTTLES DRAWN AEROBIC AND ANAEROBIC Blood Culture adequate volume   Culture   Final    NO GROWTH 5 DAYS Performed at [place] [address] [place], [place] [address]    Report Status 11/07/2021 FINAL  Final  Blood Culture (routine x 2)     Status: None   Collection Time: 11/02/21 11:32 AM   Specimen: Left Antecubital; Blood  Result Value Ref Range Status   Specimen Description LEFT ANTECUBITAL  Final   Special Requests   Final    BOTTLES DRAWN AEROBIC AND ANAEROBIC Blood  Culture adequate volume   Culture   Final    NO GROWTH 5 DAYS Performed at [facility], [place] [address] [place], [place] [address]    Report Status 11/07/2021 FINAL  Final  MRSA Next Gen by PCR, Nasal     Status: None   Collection Time: 11/02/21  3:00 PM   Specimen: Nasal Mucosa; Nasal Swab  Result Value Ref Range Status   MRSA by PCR Next Gen NOT DETECTED NOT DETECTED Final    Comment: (NOTE) The GeneXpert MRSA Assay (FDA approved for NASAL specimens only), is one component of a comprehensive MRSA colonization surveillance program. It is not intended to diagnose MRSA infection nor to guide or monitor treatment for MRSA  infections. Test performance is not FDA approved in patients less than 39 years old. Performed at [facility], [place] [address], [place], [place] [address]   Urine Culture     Status: Abnormal   Collection Time: 11/02/21  8:46 PM   Specimen: In/Out Cath Urine  Result Value Ref Range Status   Specimen Description IN/OUT CATH URINE  Final   Special Requests   Final    NONE Performed at [facility], [place] [address] [place], [place] [address]    Culture 2,000 COLONIES/mL STAPHYLOCOCCUS EPIDERMIDIS (A)  Final   Report Status 11/05/2021 FINAL  Final   Organism ID, Bacteria STAPHYLOCOCCUS EPIDERMIDIS (A)  Final      Susceptibility   Staphylococcus epidermidis - MIC*    CIPROFLOXACIN <=0.5 SENSITIVE Sensitive     GENTAMICIN <=0.5 SENSITIVE Sensitive     NITROFURANTOIN <=16 SENSITIVE Sensitive     OXACILLIN >=4 RESISTANT Resistant     TETRACYCLINE <=1 SENSITIVE Sensitive     VANCOMYCIN 1 SENSITIVE Sensitive     TRIMETH/SULFA <=10 SENSITIVE Sensitive     CLINDAMYCIN >=8 RESISTANT Resistant     RIFAMPIN <=0.5 SENSITIVE Sensitive     Inducible Clindamycin NEGATIVE Sensitive     * 2,000 COLONIES/mL STAPHYLOCOCCUS EPIDERMIDIS          Radiology Studies: No results found.      Scheduled Meds:  amitriptyline  10 mg Oral Daily   amLODipine  10 mg Oral Daily   arformoterol  15 mcg Nebulization BID   aspirin EC  81 mg Oral Daily   budesonide (PULMICORT) nebulizer solution  0.25 mg Nebulization BID   carvedilol  3.125 mg Oral BID WC   clopidogrel  75 mg Oral Daily   dicyclomine  20 mg Oral TID   dronabinol  2.5 mg Oral BID AC   feeding supplement  237 mL Oral BID BM   ferrous sulfate  325 mg Oral Q breakfast   heparin  5,000 Units Subcutaneous Q12H   hydrALAZINE  50 mg Oral Q8H   hydrOXYzine  25 mg Oral QHS   ipratropium-albuterol  3 mL Nebulization TID   lidocaine  1 patch Transdermal Daily   magnesium oxide  400 mg Oral BID   multivitamin with minerals  1  tablet Oral QHS   polyethylene glycol  17 g Oral Daily   pregabalin  75 mg Oral Daily   rosuvastatin  40 mg Oral Daily   sodium bicarbonate  1,300 mg Oral TID   sodium zirconium cyclosilicate  10 g Oral TID   umeclidinium bromide  1 puff Inhalation Daily   valproic acid  250 mg Oral QHS   Continuous Infusions:     LOS: 9 days  Oren Binet, MD [facility]   To contact the attending provider between 7A-7P or the covering provider during after hours 7P-7A, please log into the web site [email or web address] and access using universal [place] password for that web site. If you do not have the password, please call the hospital operator.  11/11/2021, 1:41 PM   Patient ID: [name], female   DOB: 05/31/1941, 81 y.o.   MRN: [id number]

## 2021-11-12 DIAGNOSIS — R627 Adult failure to thrive: Secondary | ICD-10-CM | POA: Diagnosis not present

## 2021-11-12 DIAGNOSIS — J189 Pneumonia, unspecified organism: Secondary | ICD-10-CM | POA: Diagnosis not present

## 2021-11-12 DIAGNOSIS — J441 Chronic obstructive pulmonary disease with (acute) exacerbation: Secondary | ICD-10-CM | POA: Diagnosis not present

## 2021-11-12 DIAGNOSIS — N184 Chronic kidney disease, stage 4 (severe): Secondary | ICD-10-CM | POA: Diagnosis not present

## 2021-11-12 MED ORDER — DRONABINOL 2.5 MG PO CAPS: 2.5000 mg | ORAL_CAPSULE | Freq: Two times a day (BID) | ORAL | 0 refills | Status: AC

## 2021-11-12 MED ORDER — ALBUTEROL SULFATE (2.5 MG/3ML) 0.083% IN NEBU: 2.5000 mg | INHALATION_SOLUTION | RESPIRATORY_TRACT | 12 refills | Status: DC | PRN

## 2021-11-12 MED ORDER — LOKELMA 10 G PO PACK: 10.0000 g | PACK | Freq: Three times a day (TID) | ORAL | 0 refills | Status: DC

## 2021-11-12 MED ORDER — LORAZEPAM 0.5 MG PO TABS: 0.5000 mg | ORAL_TABLET | Freq: Three times a day (TID) | ORAL | 0 refills | Status: DC | PRN

## 2021-11-12 MED ORDER — CARVEDILOL 3.125 MG PO TABS: 3.1250 mg | ORAL_TABLET | Freq: Two times a day (BID) | ORAL | Status: DC

## 2021-11-12 MED ORDER — HYDROMORPHONE HCL 2 MG PO TABS: 1.0000 mg | ORAL_TABLET | ORAL | 0 refills | Status: DC | PRN

## 2021-11-12 NOTE — TOC Progression Note (Addendum)
Transition of Care [facility]) - Progression Note    Patient Details  Name: Alyssa [name] MRN: [id number] Date of Birth: 04/04/1941  Transition of Care [facility]) CM/SW [place], LCSW Phone Number: 11/12/2021, 9:58 AM  Clinical Narrative:    [facility] received insurance approval for patient and can accept her today. CSW updated patient's son who stated that he knows patient would rather come home but that she has to go to SNF. He requested [facility] for transport. CSW made [facility] palliative aware of discharge.    Expected Discharge Plan: Skilled Nursing Facility Barriers to Discharge: Barriers Resolved  Expected Discharge Plan and Services Expected Discharge Plan: [place]       Living arrangements for the past 2 months: Single Family Home                                       Social Determinants of Health (SDOH) Interventions    Readmission Risk Interventions Readmission Risk Prevention Plan 11/06/2021 10/09/2021 07/10/2020  Transportation Screening Complete Complete Complete  Medication Review Press photographer) Complete Complete Complete  PCP or Specialist appointment within 3-5 days of discharge Complete Complete -  [place] or Home Care Consult Complete Complete Complete  SW Recovery Care/Counseling Consult Complete Complete Complete  Palliative Care Screening Complete Not Applicable Not Applicable  Skilled Nursing Facility Complete Not Applicable Not Applicable  Some recent data might be hidden

## 2021-11-12 NOTE — Progress Notes (Signed)
Multiple call are made to [facility] for patient's discharge report but no one responded.

## 2021-11-12 NOTE — Care Management Important Message (Signed)
Important Message  Patient Details  Name: Alyssa [name] MRN: [id number] Date of Birth: [birth year]   Medicare Important Message Given:  Yes     Hannah Beat 11/12/2021, 1:34 PM

## 2021-11-12 NOTE — TOC Transition Note (Signed)
Transition of Care [facility]) - CM/SW Discharge Note   Patient Details  Name: Alyssa [name] MRN: [id number] Date of Birth: [birth year]  Transition of Care [facility]) CM/SW Contact:  Benard Halsted, LCSW Phone Number: 11/12/2021, 12:17 PM   Clinical Narrative:    Patient will DC to: [place] Anticipated DC date: 11/12/21 Family notified: Son, [name] Transport by: Corey Harold   Per MD patient ready for DC to [facility]. RN to call report prior to discharge [phone]). RN, patient, patient's family, and facility notified of DC. Discharge Summary and FL2 sent to facility. DC packet on chart. Ambulance transport requested for patient.   CSW will sign off for now as social work intervention is no longer needed. Please consult [place] again if new needs arise.     Final next level of care: Skilled Nursing Facility Barriers to Discharge: Barriers Resolved   Patient Goals and CMS Choice        Discharge Placement   Existing PASRR number confirmed : 11/12/21          Patient [facility] bed at: [facility] Patient to be transferred to facility by: [place] Name of family member notified: Son Patient and family notified of of transfer: 11/12/21  Discharge Plan and Services                                     Social Determinants of Health (SDOH) Interventions     Readmission Risk Interventions Readmission Risk Prevention Plan 11/06/2021 10/09/2021 07/10/2020  Transportation Screening Complete Complete Complete  Medication Review Press photographer) Complete Complete Complete  PCP or Specialist appointment within 3-5 days of discharge Complete Complete -  [place] or Home Care Consult Complete Complete Complete  SW Recovery Care/Counseling Consult Complete Complete Complete  Palliative Care Screening Complete Not Applicable Not [place] Complete Not Applicable Not Applicable  Some recent data might be hidden

## 2021-11-12 NOTE — Progress Notes (Signed)
[facility]) Hospital Liaison note:  Notified by [facility] of request for [facility] Palliative Care services. Will continue to follow for disposition.  Please call with any outpatient palliative questions or concerns.  Thank you for the opportunity to participate in this patient's care.  Thank you, Lorelee Market, LPN [facility] Liaison [phone]

## 2021-11-15 ENCOUNTER — Emergency Department (HOSPITAL_COMMUNITY): Payer: Medicare Other

## 2021-11-15 ENCOUNTER — Other Ambulatory Visit: Payer: Self-pay

## 2021-11-15 ENCOUNTER — Inpatient Hospital Stay (HOSPITAL_COMMUNITY)
Admission: EM | Admit: 2021-11-15 | Discharge: 2021-11-17 | DRG: 640 | Disposition: A | Discharge status: Hospice | Payer: Medicare Other | Source: Skilled Nursing Facility | Attending: Family Medicine | Admitting: Family Medicine

## 2021-11-15 DIAGNOSIS — I129 Hypertensive chronic kidney disease with stage 1 through stage 4 chronic kidney disease, or unspecified chronic kidney disease: Secondary | ICD-10-CM | POA: Diagnosis present

## 2021-11-15 DIAGNOSIS — Z66 Do not resuscitate: Secondary | ICD-10-CM | POA: Diagnosis present

## 2021-11-15 DIAGNOSIS — R4182 Altered mental status, unspecified: Secondary | ICD-10-CM

## 2021-11-15 DIAGNOSIS — D649 Anemia, unspecified: Secondary | ICD-10-CM | POA: Diagnosis not present

## 2021-11-15 DIAGNOSIS — R54 Age-related physical debility: Secondary | ICD-10-CM | POA: Diagnosis present

## 2021-11-15 DIAGNOSIS — Z87891 Personal history of nicotine dependence: Secondary | ICD-10-CM | POA: Diagnosis not present

## 2021-11-15 DIAGNOSIS — N184 Chronic kidney disease, stage 4 (severe): Secondary | ICD-10-CM | POA: Diagnosis not present

## 2021-11-15 DIAGNOSIS — Z789 Other specified health status: Secondary | ICD-10-CM | POA: Diagnosis not present

## 2021-11-15 DIAGNOSIS — G894 Chronic pain syndrome: Secondary | ICD-10-CM | POA: Diagnosis present

## 2021-11-15 DIAGNOSIS — Z7982 Long term (current) use of aspirin: Secondary | ICD-10-CM

## 2021-11-15 DIAGNOSIS — Z681 Body mass index (BMI) 19 or less, adult: Secondary | ICD-10-CM

## 2021-11-15 DIAGNOSIS — Z809 Family history of malignant neoplasm, unspecified: Secondary | ICD-10-CM | POA: Diagnosis not present

## 2021-11-15 DIAGNOSIS — Z7189 Other specified counseling: Secondary | ICD-10-CM | POA: Diagnosis not present

## 2021-11-15 DIAGNOSIS — I739 Peripheral vascular disease, unspecified: Secondary | ICD-10-CM | POA: Diagnosis present

## 2021-11-15 DIAGNOSIS — G629 Polyneuropathy, unspecified: Secondary | ICD-10-CM | POA: Diagnosis present

## 2021-11-15 DIAGNOSIS — Z79899 Other long term (current) drug therapy: Secondary | ICD-10-CM

## 2021-11-15 DIAGNOSIS — R0602 Shortness of breath: Secondary | ICD-10-CM | POA: Diagnosis not present

## 2021-11-15 DIAGNOSIS — Z20822 Contact with and (suspected) exposure to covid-19: Secondary | ICD-10-CM | POA: Diagnosis present

## 2021-11-15 DIAGNOSIS — Z83438 Family history of other disorder of lipoprotein metabolism and other lipidemia: Secondary | ICD-10-CM

## 2021-11-15 DIAGNOSIS — K5790 Diverticulosis of intestine, part unspecified, without perforation or abscess without bleeding: Secondary | ICD-10-CM | POA: Diagnosis present

## 2021-11-15 DIAGNOSIS — F0394 Unspecified dementia, unspecified severity, with anxiety: Secondary | ICD-10-CM | POA: Diagnosis present

## 2021-11-15 DIAGNOSIS — R627 Adult failure to thrive: Principal | ICD-10-CM | POA: Diagnosis present

## 2021-11-15 DIAGNOSIS — R0989 Other specified symptoms and signs involving the circulatory and respiratory systems: Secondary | ICD-10-CM | POA: Diagnosis not present

## 2021-11-15 DIAGNOSIS — A419 Sepsis, unspecified organism: Secondary | ICD-10-CM | POA: Diagnosis present

## 2021-11-15 DIAGNOSIS — E43 Unspecified severe protein-calorie malnutrition: Secondary | ICD-10-CM | POA: Diagnosis present

## 2021-11-15 DIAGNOSIS — K922 Gastrointestinal hemorrhage, unspecified: Secondary | ICD-10-CM

## 2021-11-15 DIAGNOSIS — Z96641 Presence of right artificial hip joint: Secondary | ICD-10-CM | POA: Diagnosis present

## 2021-11-15 DIAGNOSIS — R0681 Apnea, not elsewhere classified: Secondary | ICD-10-CM | POA: Diagnosis not present

## 2021-11-15 DIAGNOSIS — E785 Hyperlipidemia, unspecified: Secondary | ICD-10-CM | POA: Diagnosis present

## 2021-11-15 DIAGNOSIS — I729 Aneurysm of unspecified site: Secondary | ICD-10-CM | POA: Diagnosis not present

## 2021-11-15 DIAGNOSIS — Z7902 Long term (current) use of antithrombotics/antiplatelets: Secondary | ICD-10-CM | POA: Diagnosis not present

## 2021-11-15 DIAGNOSIS — I728 Aneurysm of other specified arteries: Secondary | ICD-10-CM | POA: Diagnosis present

## 2021-11-15 DIAGNOSIS — Z79891 Long term (current) use of opiate analgesic: Secondary | ICD-10-CM

## 2021-11-15 DIAGNOSIS — Z515 Encounter for palliative care: Secondary | ICD-10-CM | POA: Diagnosis not present

## 2021-11-15 DIAGNOSIS — Z8249 Family history of ischemic heart disease and other diseases of the circulatory system: Secondary | ICD-10-CM

## 2021-11-15 LAB — CBC WITH DIFFERENTIAL/PLATELET
Abs Immature Granulocytes: 0.11 10*3/uL — ABNORMAL HIGH (ref 0.00–0.07)
Basophils Absolute: 0.1 10*3/uL (ref 0.0–0.1)
Basophils Relative: 1 %
Eosinophils Absolute: 0.1 10*3/uL (ref 0.0–0.5)
Eosinophils Relative: 1 %
HCT: 17.8 % — ABNORMAL LOW (ref 36.0–46.0)
Hemoglobin: 5.4 g/dL — CL (ref 12.0–15.0)
Immature Granulocytes: 1 %
Lymphocytes Relative: 20 %
Lymphs Abs: 2.4 10*3/uL (ref 0.7–4.0)
MCH: 27.7 pg (ref 26.0–34.0)
MCHC: 30.3 g/dL (ref 30.0–36.0)
MCV: 91.3 fL (ref 80.0–100.0)
Monocytes Absolute: 1.5 10*3/uL — ABNORMAL HIGH (ref 0.1–1.0)
Monocytes Relative: 13 %
Neutro Abs: 7.3 10*3/uL (ref 1.7–7.7)
Neutrophils Relative %: 64 %
Platelets: 474 10*3/uL — ABNORMAL HIGH (ref 150–400)
RBC: 1.95 MIL/uL — ABNORMAL LOW (ref 3.87–5.11)
RDW: 21.9 % — ABNORMAL HIGH (ref 11.5–15.5)
WBC: 11.5 10*3/uL — ABNORMAL HIGH (ref 4.0–10.5)
nRBC: 0.3 % — ABNORMAL HIGH (ref 0.0–0.2)

## 2021-11-15 LAB — PROTIME-INR
INR: 0.9 (ref 0.8–1.2)
Prothrombin Time: 12.3 seconds (ref 11.4–15.2)

## 2021-11-15 LAB — PREPARE RBC (CROSSMATCH)

## 2021-11-15 LAB — COMPREHENSIVE METABOLIC PANEL
ALT: 54 U/L — ABNORMAL HIGH (ref 0–44)
AST: 47 U/L — ABNORMAL HIGH (ref 15–41)
Albumin: 3.2 g/dL — ABNORMAL LOW (ref 3.5–5.0)
Alkaline Phosphatase: 100 U/L (ref 38–126)
Anion gap: 9 (ref 5–15)
BUN: 48 mg/dL — ABNORMAL HIGH (ref 8–23)
CO2: 28 mmol/L (ref 22–32)
Calcium: 8.8 mg/dL — ABNORMAL LOW (ref 8.9–10.3)
Chloride: 107 mmol/L (ref 98–111)
Creatinine, Ser: 3.25 mg/dL — ABNORMAL HIGH (ref 0.44–1.00)
GFR, Estimated: 14 mL/min — ABNORMAL LOW (ref >60)
Glucose, Bld: 109 mg/dL — ABNORMAL HIGH (ref 70–99)
Potassium: 4.2 mmol/L (ref 3.5–5.1)
Sodium: 144 mmol/L (ref 135–145)
Total Bilirubin: 0.4 mg/dL (ref 0.3–1.2)
Total Protein: 6.2 g/dL — ABNORMAL LOW (ref 6.5–8.1)

## 2021-11-15 LAB — URINALYSIS, ROUTINE W REFLEX MICROSCOPIC
Bilirubin Urine: NEGATIVE
Glucose, UA: NEGATIVE mg/dL
Ketones, ur: NEGATIVE mg/dL
Nitrite: NEGATIVE
Protein, ur: 100 mg/dL — AB
Specific Gravity, Urine: 1.011 (ref 1.005–1.030)
pH: 9 — ABNORMAL HIGH (ref 5.0–8.0)

## 2021-11-15 LAB — VALPROIC ACID LEVEL: Valproic Acid Lvl: <10 ug/mL — ABNORMAL LOW (ref 50.0–100.0)

## 2021-11-15 LAB — HEMOGLOBIN AND HEMATOCRIT, BLOOD
HCT: 26.6 % — ABNORMAL LOW (ref 36.0–46.0)
Hemoglobin: 8.2 g/dL — ABNORMAL LOW (ref 12.0–15.0)

## 2021-11-15 LAB — AMMONIA: Ammonia: <10 umol/L (ref 9–35)

## 2021-11-15 LAB — TROPONIN I (HIGH SENSITIVITY)
Troponin I (High Sensitivity): 27 ng/L — ABNORMAL HIGH (ref <18)
Troponin I (High Sensitivity): 27 ng/L — ABNORMAL HIGH (ref <18)

## 2021-11-15 LAB — LACTIC ACID, PLASMA
Lactic Acid, Venous: 1.3 mmol/L (ref 0.5–1.9)
Lactic Acid, Venous: 0.9 mmol/L (ref 0.5–1.9)

## 2021-11-15 LAB — RESP PANEL BY RT-PCR (FLU A&B, COVID) ARPGX2
Influenza A by PCR: NEGATIVE
Influenza B by PCR: NEGATIVE
SARS Coronavirus 2 by RT PCR: NEGATIVE

## 2021-11-15 LAB — APTT: aPTT: 25 seconds (ref 24–36)

## 2021-11-15 LAB — POC OCCULT BLOOD, ED: Fecal Occult Bld: POSITIVE — AB

## 2021-11-15 MED ORDER — PREGABALIN 75 MG PO CAPS
75.0000 mg | ORAL_CAPSULE | Freq: Every day | ORAL | Status: DC
  Administered 2021-11-16: 75 mg via ORAL
  Filled 2021-11-15: qty 1

## 2021-11-15 MED ORDER — ROSUVASTATIN CALCIUM 20 MG PO TABS: 40.0000 mg | ORAL_TABLET | Freq: Every evening | ORAL | Status: DC

## 2021-11-15 MED ORDER — HYDROXYZINE HCL 25 MG PO TABS
25.0000 mg | ORAL_TABLET | Freq: Every day | ORAL | Status: DC
  Administered 2021-11-15 – 2021-11-16 (×2): 25 mg via ORAL
  Filled 2021-11-15 (×2): qty 1

## 2021-11-15 MED ORDER — AMITRIPTYLINE HCL 10 MG PO TABS
10.0000 mg | ORAL_TABLET | Freq: Every day | ORAL | Status: DC
  Administered 2021-11-15 – 2021-11-17 (×3): 10 mg via ORAL
  Filled 2021-11-15 (×3): qty 1

## 2021-11-15 MED ORDER — DRONABINOL 2.5 MG PO CAPS
2.5000 mg | ORAL_CAPSULE | Freq: Two times a day (BID) | ORAL | Status: DC
  Administered 2021-11-15 – 2021-11-16 (×2): 2.5 mg via ORAL
  Filled 2021-11-15 (×2): qty 1

## 2021-11-15 MED ORDER — PANTOPRAZOLE SODIUM 40 MG IV SOLR
40.0000 mg | Freq: Two times a day (BID) | INTRAVENOUS | Status: DC
  Administered 2021-11-15 – 2021-11-17 (×4): 40 mg via INTRAVENOUS
  Filled 2021-11-15 (×3): qty 40

## 2021-11-15 MED ORDER — ALBUTEROL SULFATE (2.5 MG/3ML) 0.083% IN NEBU: 2.5000 mg | INHALATION_SOLUTION | RESPIRATORY_TRACT | Status: DC | PRN

## 2021-11-15 MED ORDER — CARVEDILOL 3.125 MG PO TABS
3.1250 mg | ORAL_TABLET | Freq: Two times a day (BID) | ORAL | Status: DC
  Administered 2021-11-15 – 2021-11-17 (×4): 3.125 mg via ORAL
  Filled 2021-11-15 (×4): qty 1

## 2021-11-15 MED ORDER — FLUTICASONE FUROATE-VILANTEROL 100-25 MCG/ACT IN AEPB
1.0000 | INHALATION_SPRAY | Freq: Every day | RESPIRATORY_TRACT | Status: DC
  Administered 2021-11-16 – 2021-11-17 (×2): 1 via RESPIRATORY_TRACT
  Filled 2021-11-15: qty 28

## 2021-11-15 MED ORDER — SODIUM CHLORIDE 0.9% IV SOLUTION: Freq: Once | INTRAVENOUS | Status: AC

## 2021-11-15 MED ORDER — DICLOFENAC SODIUM 1 % EX GEL: 2.0000 g | Freq: Four times a day (QID) | CUTANEOUS | Status: DC | PRN

## 2021-11-15 MED ORDER — DICYCLOMINE HCL 20 MG PO TABS
20.0000 mg | ORAL_TABLET | Freq: Three times a day (TID) | ORAL | Status: DC
  Administered 2021-11-15 – 2021-11-17 (×6): 20 mg via ORAL
  Filled 2021-11-15 (×8): qty 1

## 2021-11-15 MED ORDER — BUDESON-GLYCOPYRROL-FORMOTEROL 160-9-4.8 MCG/ACT IN AERO: 2.0000 | INHALATION_SPRAY | Freq: Two times a day (BID) | RESPIRATORY_TRACT | Status: DC

## 2021-11-15 MED ORDER — ASPIRIN 81 MG PO CHEW
81.0000 mg | CHEWABLE_TABLET | Freq: Every day | ORAL | Status: DC
  Administered 2021-11-15: 81 mg via ORAL
  Filled 2021-11-15: qty 1

## 2021-11-15 MED ORDER — ALBUTEROL SULFATE (2.5 MG/3ML) 0.083% IN NEBU: 3.0000 mL | INHALATION_SOLUTION | RESPIRATORY_TRACT | Status: DC | PRN

## 2021-11-15 MED ORDER — UMECLIDINIUM BROMIDE 62.5 MCG/ACT IN AEPB
1.0000 | INHALATION_SPRAY | Freq: Every day | RESPIRATORY_TRACT | Status: DC
  Administered 2021-11-16 – 2021-11-17 (×2): 1 via RESPIRATORY_TRACT
  Filled 2021-11-15: qty 7

## 2021-11-15 MED ORDER — SODIUM ZIRCONIUM CYCLOSILICATE 10 G PO PACK: 10.0000 g | PACK | Freq: Three times a day (TID) | ORAL | Status: DC

## 2021-11-15 MED ORDER — ADULT MULTIVITAMIN W/MINERALS CH
1.0000 | ORAL_TABLET | Freq: Every day | ORAL | Status: DC
  Administered 2021-11-15: 1 via ORAL
  Filled 2021-11-15: qty 1

## 2021-11-15 MED ORDER — POLYETHYLENE GLYCOL 3350 17 G PO PACK
17.0000 g | PACK | Freq: Every day | ORAL | Status: DC
  Administered 2021-11-16 – 2021-11-17 (×2): 17 g via ORAL
  Filled 2021-11-15 (×2): qty 1

## 2021-11-15 MED ORDER — HYDRALAZINE HCL 50 MG PO TABS: 50.0000 mg | ORAL_TABLET | Freq: Four times a day (QID) | ORAL | Status: DC | PRN

## 2021-11-15 MED ORDER — ACETAMINOPHEN 325 MG PO TABS
650.0000 mg | ORAL_TABLET | Freq: Four times a day (QID) | ORAL | Status: DC | PRN
  Administered 2021-11-17: 650 mg via ORAL

## 2021-11-15 MED ORDER — LIDOCAINE 5 % EX PTCH
1.0000 | MEDICATED_PATCH | Freq: Every day | CUTANEOUS | Status: DC
  Administered 2021-11-16 – 2021-11-17 (×2): 1 via TRANSDERMAL
  Filled 2021-11-15 (×2): qty 1

## 2021-11-15 MED ORDER — SODIUM CHLORIDE 0.9 % IV SOLN: 10.0000 mL/h | Freq: Once | INTRAVENOUS | Status: DC

## 2021-11-15 MED ORDER — VALPROIC ACID 250 MG PO CAPS
250.0000 mg | ORAL_CAPSULE | Freq: Every day | ORAL | Status: DC
  Administered 2021-11-15 – 2021-11-16 (×2): 250 mg via ORAL
  Filled 2021-11-15 (×3): qty 1

## 2021-11-15 MED ORDER — LACTATED RINGERS IV BOLUS (SEPSIS)
500.0000 mL | Freq: Once | INTRAVENOUS | Status: AC
  Administered 2021-11-15: 500 mL via INTRAVENOUS

## 2021-11-15 MED ORDER — SODIUM BICARBONATE 650 MG PO TABS
1300.0000 mg | ORAL_TABLET | Freq: Two times a day (BID) | ORAL | Status: DC
  Administered 2021-11-15 – 2021-11-16 (×2): 1300 mg via ORAL
  Filled 2021-11-15 (×2): qty 2

## 2021-11-15 MED ORDER — PANTOPRAZOLE SODIUM 40 MG IV SOLR
40.0000 mg | Freq: Once | INTRAVENOUS | Status: DC
  Filled 2021-11-15: qty 40

## 2021-11-15 MED ORDER — BOOST PLUS PO LIQD
237.0000 mL | Freq: Every day | ORAL | Status: DC
  Administered 2021-11-16: 237 mL via ORAL
  Filled 2021-11-15: qty 237

## 2021-11-15 MED ORDER — FERROUS SULFATE 325 (65 FE) MG PO TABS
325.0000 mg | ORAL_TABLET | Freq: Every day | ORAL | Status: DC
  Administered 2021-11-16 – 2021-11-17 (×2): 325 mg via ORAL
  Filled 2021-11-15 (×2): qty 1

## 2021-11-15 MED ORDER — HYDROMORPHONE HCL 2 MG PO TABS
1.0000 mg | ORAL_TABLET | ORAL | Status: DC | PRN
  Administered 2021-11-16: 2 mg via ORAL
  Filled 2021-11-15: qty 1

## 2021-11-15 MED ORDER — LORAZEPAM 0.5 MG PO TABS: 0.5000 mg | ORAL_TABLET | Freq: Three times a day (TID) | ORAL | Status: DC | PRN

## 2021-11-15 MED ORDER — MAGNESIUM OXIDE -MG SUPPLEMENT 400 (240 MG) MG PO TABS
400.0000 mg | ORAL_TABLET | Freq: Two times a day (BID) | ORAL | Status: DC
  Administered 2021-11-15 – 2021-11-16 (×2): 400 mg via ORAL
  Filled 2021-11-15 (×2): qty 1

## 2021-11-15 NOTE — ED Triage Notes (Signed)
BIB EMS for code sepsis from [facility]. At baseline pt is A&Ox4 and can transfer from bed to w/c. Last night pt became increasingly lethargic and not acting her norm, staff called EMS today for fever, minimal verbal response and acute change from baseline. EMS VS 154/88, HR 80, 101.1 temp and ETCO2 24.

## 2021-11-15 NOTE — H&P (Signed)
History and Physical    Alyssa [name] HCW:[id number] DOB: 09/14/1941 DOA: 11/15/2021  PCP: Pcp, No (Confirm with patient/family/NH records and if not entered, this has to be entered at [facility] point of entry) Patient coming from: Nursing home  I have personally briefly reviewed patient's old medical records in [place]  Chief Complaint: Feeling tired  HPI: Alyssa [name] is a 81 y.o. female with medical history significant of HTN, HLD, PVD on Plavix, CKD stage IV, COPD, advanced dementia, pseudoaneurysm of right-sided groin, severe protein calorie malnutrition, sent from nursing home for worsening of mentation.  According to the nursing home report, patient has been complaining being about feeling lethargic since yesterday afternoon.  This morning nursing home found patient hard to arouse and called EMS.  EMS arrived and found patient blood pressure 150/88, heart rate 80, temperature 101.1 F.  Denied any abdominal pain, no chest pain or shortness of breath.  She denied any rectal bleed, she does not pay attention to her stool color.  ED Course: He was found afebrile, temperature 97.0, blood pressure elevated 164/91, no tachycardia.  Hemoglobin 5.4 compared to 8.9 on last admission.  Patient mentation significantly improved in the ED, no confusion.  Guaiac test positive in the ED.  1 unit of PRBC ordered in the ED.  Review of Systems: As per HPI otherwise 14 point review of systems negative.    Past Medical History:  Diagnosis Date   Anxiety    Arthritis    Chronic kidney disease    Headache(784.0)    Hyperlipidemia    Hypertension    Peripheral neuropathy    Peripheral vascular disease ([place])    Syncope 09/2020    Past Surgical History:  Procedure Laterality Date   ABDOMINAL ANGIOGRAM N/A 10/29/2011   Procedure: ABDOMINAL ANGIOGRAM;  Surgeon: Elam Dutch, MD;  Location: [facility] CATH LAB;  Service: Cardiovascular;  Laterality: N/A;   BACK SURGERY      COLONOSCOPY     ESOPHAGOGASTRODUODENOSCOPY     FEMORAL-FEMORAL BYPASS GRAFT  08/31/2010   FLEXIBLE SIGMOIDOSCOPY N/A 06/12/2019   Procedure: FLEXIBLE SIGMOIDOSCOPY;  Surgeon: Ladene Artist, MD;  Location: [place];  Service: Endoscopy;  Laterality: N/A;  Patient had little bit to eat this morning so this will be unsedated.   JOINT REPLACEMENT Right 10/18/2010   Hip   LOWER EXTREMITY ANGIOGRAPHY  06/14/2017   Procedure: Lower Extremity Angiography;  Surgeon: Adrian Prows, MD;  Location: [place] CV LAB;  Service: Cardiovascular;;  Bilateral limited angio performed   RENAL ANGIOGRAPHY N/A 06/14/2017   Procedure: Renal Angiography;  Surgeon: Adrian Prows, MD;  Location: [place] CV LAB;  Service: Cardiovascular;  Laterality: N/A;   TOTAL HIP ARTHROPLASTY     right     reports that she quit smoking about 6 months ago. Her smoking use included cigarettes. She has a 50.00 pack-year smoking history. She has never used smokeless tobacco. She reports that she does not drink alcohol and does not use drugs.  No Known Allergies  Family History  Problem Relation Age of Onset   Heart attack Mother    Heart disease Mother    Hyperlipidemia Mother    Hypertension Mother    Heart attack Father    Heart disease Father        Before age 69   Hyperlipidemia Father    Hypertension Father    Cancer Sister        brain tumor   Aneurysm Brother  brain   Colon cancer Neg Hx    Liver cancer Neg Hx      Prior to Admission medications   Medication Sig Start Date End Date Taking? Authorizing Provider  acetaminophen (TYLENOL) 325 MG tablet Take 650 mg by mouth every 6 (six) hours as needed for mild pain, fever or headache.   Yes [provider]  albuterol (PROVENTIL) (2.5 MG/3ML) 0.083% nebulizer solution Inhale 3 mLs (2.5 mg total) into the lungs every 4 (four) hours as needed for wheezing or shortness of breath. 11/12/21  Yes Ghimire, Henreitta Leber, MD  albuterol (VENTOLIN HFA) 108  (90 Base) MCG/ACT inhaler Inhale 2 puffs into the lungs every 4 (four) hours as needed for wheezing or shortness of breath.   Yes [provider]  amitriptyline (ELAVIL) 10 MG tablet Take 10 mg by mouth daily.   Yes [provider]  amLODipine (NORVASC) 10 MG tablet Take 1 tablet (10 mg total) by mouth daily. 09/18/21  Yes Little Ishikawa, MD  aspirin 81 MG EC tablet Take 1 tablet (81 mg total) by mouth daily. 01/09/19  Yes Gherghe, Vella Redhead, MD  Budeson-Glycopyrrol-Formoterol (BREZTRI AEROSPHERE) 160-9-4.8 MCG/ACT AERO Inhale 2 puffs into the lungs 2 (two) times daily. 07/23/21  Yes Tanda Rockers, MD  carvedilol (COREG) 3.125 MG tablet Take 1 tablet (3.125 mg total) by mouth 2 (two) times daily with a meal. 11/12/21  Yes Ghimire, Henreitta Leber, MD  clopidogrel (PLAVIX) 75 MG tablet Take 1 tablet (75 mg total) by mouth daily. Patient taking differently: Take 75 mg by mouth every evening. 01/09/19  Yes Gherghe, Vella Redhead, MD  diclofenac Sodium (VOLTAREN) 1 % GEL Apply 2 g topically every 6 (six) hours as needed (knee pain). 01/15/20  Yes [provider]  dicyclomine (BENTYL) 20 MG tablet Take 20 mg by mouth 3 (three) times daily. 04/01/20  Yes [provider]  dronabinol (MARINOL) 2.5 MG capsule Take 1 capsule (2.5 mg total) by mouth 2 (two) times daily before lunch and supper. 11/12/21  Yes Ghimire, Henreitta Leber, MD  Ensure Plus (ENSURE PLUS) LIQD Take 237 mLs by mouth daily.   Yes [provider]  Ferrous Sulfate (IRON HIGH-POTENCY) 325 MG TABS Take 325 mg by mouth daily with breakfast. Patient taking differently: Take 325 mg by mouth daily. 06/30/19  Yes Hongalgi, Lenis Dickinson, MD  hydrALAZINE (APRESOLINE) 50 MG tablet Take 1 tablet (50 mg total) by mouth every 8 (eight) hours. 10/09/21  Yes Patrecia Pour, MD  HYDROmorphone (DILAUDID) 2 MG tablet Take 0.5-1 tablets (1-2 mg total) by mouth every 4 (four) hours as needed for severe pain or moderate pain. 11/12/21  Yes  Ghimire, Henreitta Leber, MD  hydrOXYzine (ATARAX/VISTARIL) 25 MG tablet Take 25 mg by mouth at bedtime. 05/12/20  Yes [provider]  Lidocaine 4 % PTCH Apply 1 patch topically daily. Right shoulder   Yes [provider]  LORazepam (ATIVAN) 0.5 MG tablet Take 1 tablet (0.5 mg total) by mouth every 8 (eight) hours as needed for anxiety or sleep. 11/12/21  Yes Ghimire, Henreitta Leber, MD  magnesium oxide (MAG-OX) 400 MG tablet Take 400 mg by mouth 2 (two) times daily.   Yes [provider]  Multiple Vitamins-Minerals (CERTAVITE/ANTIOXIDANTS) TABS Take 1 tablet by mouth at bedtime. 10/09/21  Yes Patrecia Pour, MD  nitroGLYCERIN (NITROSTAT) 0.4 MG SL tablet Place 1 tablet (0.4 mg total) under the tongue every 5 (five) minutes as needed for chest pain. Patient taking differently:  Place 0.4 mg under the tongue every 5 (five) minutes x 3 doses as needed for chest pain. 01/07/20  Yes Allie Bossier, MD  polyethylene glycol (MIRALAX) 17 g packet Take 17 g by mouth daily. 10/24/21 11/23/21 Yes Ghimire, Henreitta Leber, MD  pregabalin (LYRICA) 75 MG capsule Take 1 capsule (75 mg total) by mouth daily. 07/11/21 11/17/21 Yes Antonieta Pert, MD  rosuvastatin (CRESTOR) 40 MG tablet Take 40 mg by mouth every evening. 10/16/21  Yes [provider]  sodium bicarbonate 650 MG tablet Take 2 tablets (1,300 mg total) by mouth 2 (two) times daily. 01/07/20  Yes Allie Bossier, MD  sodium zirconium cyclosilicate (LOKELMA) 10 g PACK packet Take 10 g by mouth 3 (three) times daily. Do not take this within 2 hours of other medications 11/12/21  Yes Ghimire, Henreitta Leber, MD  valproic acid (DEPAKENE) 250 MG capsule Take 250 mg by mouth at bedtime.   Yes [provider]    Physical Exam: Vitals:   11/15/21 1229 11/15/21 1315 11/15/21 1330 11/15/21 1445  BP:  (!) 163/99 (!) 167/92 (!) 160/88  Pulse:  85 88 87  Resp:  15 12 14   Temp:      TempSrc:      SpO2:  98% 95% 99%  Weight: 43 kg     Height: 5\' 3"   (1.6 m)       Constitutional: NAD, calm, comfortable Vitals:   11/15/21 1229 11/15/21 1315 11/15/21 1330 11/15/21 1445  BP:  (!) 163/99 (!) 167/92 (!) 160/88  Pulse:  85 88 87  Resp:  15 12 14   Temp:      TempSrc:      SpO2:  98% 95% 99%  Weight: 43 kg     Height: 5\' 3"  (1.6 m)      Eyes: PERRL, lids and conjunctivae normal ENMT: Mucous membranes are moist. Posterior pharynx clear of any exudate or lesions.Normal dentition.  Neck: normal, supple, no masses, no thyromegaly Respiratory: clear to auscultation bilaterally, no wheezing, no crackles. Normal respiratory effort. No accessory muscle use.  Cardiovascular: Regular rate and rhythm, no murmurs / rubs / gallops. No extremity edema. 2+ pedal pulses. No carotid bruits.  Abdomen: no tenderness, no masses palpated. No hepatosplenomegaly. Bowel sounds positive.  Musculoskeletal: no clubbing / cyanosis. No joint deformity upper and lower extremities. Good ROM, no contractures. Normal muscle tone.  Skin: no rashes, lesions, ulcers. No induration Neurologic: CN 2-12 grossly intact. Sensation intact, DTR normal. Strength 5/5 in all 4.  Psychiatric: Normal judgment and insight. Alert and oriented x 3. Normal mood.    Labs on Admission: I have personally reviewed following labs and imaging studies  CBC: Recent Labs  Lab 11/15/21 1255  WBC 11.5*  NEUTROABS 7.3  HGB 5.4*  HCT 17.8*  MCV 91.3  PLT 419*   Basic Metabolic Panel: Recent Labs  Lab 11/09/21 0053 11/09/21 0945 11/10/21 0226 11/15/21 1255  NA  --  139  --  144  K 5.9* 5.4* 5.2* 4.2  CL  --  105  --  107  CO2  --  23  --  28  GLUCOSE  --  265*  --  109*  BUN  --  75*  --  48*  CREATININE  --  3.03*  --  3.25*  CALCIUM  --  8.9  --  8.8*   GFR: Estimated Creatinine Clearance: 9.7 mL/min (A) (by C-G formula based on SCr of 3.25 mg/dL (H)). Liver Function Tests:  Recent Labs  Lab 11/15/21 1255  AST 47*  ALT 54*  ALKPHOS 100  BILITOT 0.4  PROT 6.2*  ALBUMIN  3.2*   No results for input(s): LIPASE, AMYLASE in the last 168 hours. No results for input(s): AMMONIA in the last 168 hours. Coagulation Profile: Recent Labs  Lab 11/15/21 1255  INR 0.9   Cardiac Enzymes: No results for input(s): CKTOTAL, CKMB, CKMBINDEX, TROPONINI in the last 168 hours. BNP (last 3 results) No results for input(s): PROBNP in the last 8760 hours. HbA1C: No results for input(s): HGBA1C in the last 72 hours. CBG: No results for input(s): GLUCAP in the last 168 hours. Lipid Profile: No results for input(s): CHOL, HDL, LDLCALC, TRIG, CHOLHDL, LDLDIRECT in the last 72 hours. Thyroid Function Tests: No results for input(s): TSH, T4TOTAL, FREET4, T3FREE, THYROIDAB in the last 72 hours. Anemia Panel: No results for input(s): VITAMINB12, FOLATE, FERRITIN, TIBC, IRON, RETICCTPCT in the last 72 hours. Urine analysis:    Component Value Date/Time   COLORURINE YELLOW 11/02/2021 2046   APPEARANCEUR CLEAR 11/02/2021 2046   LABSPEC 1.020 11/02/2021 2046   PHURINE 8.0 11/02/2021 2046   GLUCOSEU NEGATIVE 11/02/2021 2046   HGBUR NEGATIVE 11/02/2021 2046   BILIRUBINUR NEGATIVE 11/02/2021 2046   [place] NEGATIVE 11/02/2021 2046   PROTEINUR 100 (A) 11/02/2021 2046   UROBILINOGEN 0.2 08/24/2015 1737   NITRITE NEGATIVE 11/02/2021 2046   LEUKOCYTESUR NEGATIVE 11/02/2021 2046    Radiological Exams on Admission: CT Head Wo Contrast  Result Date: 11/15/2021 CLINICAL DATA:  BIB EMS for code sepsis from [facility]. At baseline pt is A&Ox4 and can transfer from bed to w/c. Last night pt became increasingly lethargic and not acting her norm, staff called EMS today for fever, minimal verbal response and acute change from baseline EXAM: CT HEAD WITHOUT CONTRAST TECHNIQUE: Contiguous axial images were obtained from the base of the skull through the vertex without intravenous contrast. RADIATION DOSE REDUCTION: This exam was performed according to the departmental dose-optimization  program which includes automated exposure control, adjustment of the mA and/or kV according to patient size and/or use of iterative reconstruction technique. COMPARISON:  08/07/2021. FINDINGS: Brain: No evidence of acute infarction, hemorrhage, hydrocephalus, extra-axial collection or mass lesion/mass effect. There is ventricular sulcal enlargement reflecting mild diffuse atrophy. Patchy white matter hypoattenuation is noted bilaterally consistent with moderate chronic microvascular ischemic change. These findings are stable. Vascular: No hyperdense vessel or unexpected calcification. Skull: Normal. Negative for fracture or focal lesion. Sinuses/Orbits: Visualized globes and orbits are unremarkable. There are findings of previous sinus surgery on the right with ethmoidectomies and a medial antrectomy. Dependent fluid noted in the right sphenoid sinus. Remaining sinuses are clear. Other: None. IMPRESSION: 1. No acute intracranial abnormalities. 2. Mild atrophy and moderate chronic microvascular ischemic change. 3. Fluid level in the right sphenoid sinus. Consider acute sinusitis in the proper clinical setting. Electronically Signed   By: Lajean Manes M.D.   On: 11/15/2021 14:43   DG Chest Port 1 View  Result Date: 11/15/2021 CLINICAL DATA:  Sepsis. EXAM: PORTABLE CHEST 1 VIEW COMPARISON:  November 02, 2021. FINDINGS: Stable cardiomediastinal silhouette. Both lungs are clear. The visualized skeletal structures are unremarkable. IMPRESSION: No active disease. Electronically Signed   By: Marijo Conception M.D.   On: 11/15/2021 13:42    EKG: Independently reviewed.  Sinus, LVH, Prolonged QTC.  Assessment/Plan Principal Problem:   Anemia  (please populate well all problems here in Problem List. (For example, if patient is on BP  meds at home and you resume or decide to hold them, it is a problem that needs to be her. Same for CAD, COPD, HLD and so on)  Symptomatic anemia secondary to likely acute/subacute GI  bleed -Patient has a history of diverticulosis however no overt rectal bleed.  Discussed with [facility], agreed with the GIs opinion that given the patient's overall condition, she is a poor candidate for EGD intervention, will give PRBC x2 and recheck H&H tonight and tomorrow, mildly conservative management.  Explained to patient's son over the phone, who expressed understanding and agreed. -PPI BID -Clear liquid diet -Continue aspirin but hold Plavix.  Hold long-acting BP meds such as amlodipine and hydralazine. -Not considering CT angiogram currently given risk of further worsening of kidney function.  HTN -Continue Coreg, hold amlodipine, change hydralazine to as needed.  PVD -Continue aspirin, hold Plavix.  CKD stage IV -Euvolemic, creatinine level stable.  Severe protein calorie malnutrition -Continue Marinol  Right groin pseudo aneurysm -No intervention as per vascular surgery.  Dementia -She has baseline.  Chronic pain -Continue Lyrica, as needed narcotics  Palliative care -Continue palliative care.  COPD -Stable, no active issue.  DVT prophylaxis: SCD Code Status: DNR Family Communication: Son over phone Disposition Plan: With active GI bleed, expect more than 2 midnight hospital stay Consults called: [facility] Admission status: Tele admit  Lequita Halt MD [facility] Pager [phone]  11/15/2021, 3:30 PM

## 2021-11-15 NOTE — ED Notes (Signed)
Pt confused, telephone consent received from pt's [name] with this RN and Production assistant, radio verifying.

## 2021-11-15 NOTE — Progress Notes (Signed)
Pt up to 3E02 from ED, no acute distress noted.  First unit of PRBC infusing. Telemetry applied, CCMD notified. Vital signs assessed.  CHG bath given, gown changed.  Pt oriented to room and call bell. She expresses understanding of how to reach staff when needed.

## 2021-11-15 NOTE — ED Provider Notes (Signed)
[facility] EMERGENCY DEPARTMENT Provider Note   CSN: [id number] Arrival date & time: 11/15/21  1218     History  Chief Complaint  Patient presents with   Code Sepsis    Alyssa [name] is a 81 y.o. female.  She is brought in by EMS from her facility for evaluation of possible sepsis.  Reportedly more lethargic, less interactive, less able to transfer from bed to wheelchair since last evening.  Level 5 caveat secondary to dementia and altered mental status.  Patient is awake and alert and knows her name.  She answers yes regarding pain but cannot localize it.  She has a DNR/DNI with her  The history is provided by the patient and the EMS personnel.  Altered Mental Status Presenting symptoms: lethargy   Most recent episode:  Yesterday Episode history:  Continuous Timing:  Constant Progression:  Unchanged Chronicity:  New Context: nursing home resident       Home Medications Prior to Admission medications   Medication Sig Start Date End Date Taking? Authorizing Provider  albuterol (PROVENTIL) (2.5 MG/3ML) 0.083% nebulizer solution Inhale 3 mLs (2.5 mg total) into the lungs every 4 (four) hours as needed for wheezing or shortness of breath. 11/12/21   Ghimire, Henreitta Leber, MD  albuterol (VENTOLIN HFA) 108 (90 Base) MCG/ACT inhaler Inhale 2 puffs into the lungs every 4 (four) hours as needed for wheezing or shortness of breath.    [provider]  amitriptyline (ELAVIL) 10 MG tablet Take 10 mg by mouth daily.    [provider]  amLODipine (NORVASC) 10 MG tablet Take 1 tablet (10 mg total) by mouth daily. 09/18/21   Little Ishikawa, MD  aspirin 81 MG EC tablet Take 1 tablet (81 mg total) by mouth daily. 01/09/19   Caren Griffins, MD  Budeson-Glycopyrrol-Formoterol (BREZTRI AEROSPHERE) 160-9-4.8 MCG/ACT AERO Inhale 2 puffs into the lungs 2 (two) times daily. 07/23/21   Tanda Rockers, MD  carvedilol (COREG) 3.125 MG tablet Take 1 tablet  (3.125 mg total) by mouth 2 (two) times daily with a meal. 11/12/21   Ghimire, Henreitta Leber, MD  clopidogrel (PLAVIX) 75 MG tablet Take 1 tablet (75 mg total) by mouth daily. 01/09/19   Caren Griffins, MD  diclofenac Sodium (VOLTAREN) 1 % GEL Apply 2 g topically every 6 (six) hours as needed (knee pain). 01/15/20   [provider]  dicyclomine (BENTYL) 20 MG tablet Take 20 mg by mouth 3 (three) times daily. 04/01/20   [provider]  dronabinol (MARINOL) 2.5 MG capsule Take 1 capsule (2.5 mg total) by mouth 2 (two) times daily before lunch and supper. 11/12/21   Ghimire, Henreitta Leber, MD  Ensure Plus (ENSURE PLUS) LIQD Take 237 mLs by mouth daily.    [provider]  Ferrous Sulfate (IRON HIGH-POTENCY) 325 MG TABS Take 325 mg by mouth daily with breakfast. Patient taking differently: Take 325 mg by mouth daily. 06/30/19   Hongalgi, Lenis Dickinson, MD  hydrALAZINE (APRESOLINE) 50 MG tablet Take 1 tablet (50 mg total) by mouth every 8 (eight) hours. 10/09/21   Patrecia Pour, MD  HYDROmorphone (DILAUDID) 2 MG tablet Take 0.5-1 tablets (1-2 mg total) by mouth every 4 (four) hours as needed for severe pain or moderate pain. 11/12/21   Ghimire, Henreitta Leber, MD  hydrOXYzine (ATARAX/VISTARIL) 25 MG tablet Take 25 mg by mouth at bedtime. 05/12/20   [provider]  lidocaine (LIDODERM) 5 % Place 1 patch onto the  skin daily. 08/31/21   [provider]  LORazepam (ATIVAN) 0.5 MG tablet Take 1 tablet (0.5 mg total) by mouth every 8 (eight) hours as needed for anxiety or sleep. 11/12/21   Ghimire, Henreitta Leber, MD  magnesium oxide (MAG-OX) 400 MG tablet Take 400 mg by mouth 2 (two) times daily.    [provider]  Multiple Vitamins-Minerals (CERTAVITE/ANTIOXIDANTS) TABS Take 1 tablet by mouth at bedtime. 10/09/21   Patrecia Pour, MD  nitroGLYCERIN (NITROSTAT) 0.4 MG SL tablet Place 1 tablet (0.4 mg total) under the tongue every 5 (five) minutes as needed for chest pain. 01/07/20    Allie Bossier, MD  polyethylene glycol (MIRALAX) 17 g packet Take 17 g by mouth daily. 10/24/21 11/23/21  Ghimire, Henreitta Leber, MD  pregabalin (LYRICA) 75 MG capsule Take 1 capsule (75 mg total) by mouth daily. 07/11/21 11/17/21  Antonieta Pert, MD  rosuvastatin (CRESTOR) 40 MG tablet Take 40 mg by mouth daily. 10/16/21   [provider]  sodium bicarbonate 650 MG tablet Take 2 tablets (1,300 mg total) by mouth 2 (two) times daily. 01/07/20   Allie Bossier, MD  sodium zirconium cyclosilicate (LOKELMA) 10 g PACK packet Take 10 g by mouth 3 (three) times daily. Do not take this within 2 hours of other medications 11/12/21   Jonetta Osgood, MD  valproic acid (DEPAKENE) 250 MG capsule Take 250 mg by mouth at bedtime.    [provider]      Allergies    Patient has no known allergies.    Review of Systems   Review of Systems  Unable to perform ROS: Mental status change   Physical Exam Updated Vital Signs BP (!) 164/91 (BP Location: Right Arm)    Pulse 91    Temp (!) 97 F (36.1 C) (Rectal)    Resp 19    Ht 5\' 3"  (1.6 m)    Wt 43 kg    SpO2 94%    BMI 16.79 kg/m  Physical Exam Vitals and nursing note reviewed.  Constitutional:      General: She is not in acute distress.    Appearance: She is well-developed. She is cachectic.  HENT:     Head: Normocephalic and atraumatic.  Eyes:     Conjunctiva/sclera: Conjunctivae normal.  Cardiovascular:     Rate and Rhythm: Normal rate and regular rhythm.     Heart sounds: No murmur heard. Pulmonary:     Effort: Pulmonary effort is normal. No respiratory distress.     Breath sounds: Normal breath sounds.  Abdominal:     Palpations: Abdomen is soft.     Tenderness: There is no abdominal tenderness. There is no guarding or rebound.     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  Musculoskeletal:        General: No swelling, deformity or signs of injury. Normal range of motion.      Cervical back: Neck supple.  Skin:    General: Skin is warm and dry.     Capillary Refill: Capillary refill takes less than 2 seconds.  Neurological:     General: No focal deficit present.     Mental Status: She is alert.    ED Results / Procedures / Treatments   Labs (all labs ordered are listed, but only abnormal results are displayed) Labs Reviewed  COMPREHENSIVE METABOLIC PANEL - Abnormal; Notable for the following components:      Result  Value   Glucose, Bld 109 (*)    BUN 48 (*)    Creatinine, Ser 3.25 (*)    Calcium 8.8 (*)    Total Protein 6.2 (*)    Albumin 3.2 (*)    AST 47 (*)    ALT 54 (*)    GFR, Estimated 14 (*)    All other components within normal limits  CBC WITH DIFFERENTIAL/PLATELET - Abnormal; Notable for the following components:   WBC 11.5 (*)    RBC 1.95 (*)    Hemoglobin 5.4 (*)    HCT 17.8 (*)    RDW 21.9 (*)    Platelets 474 (*)    nRBC 0.3 (*)    Monocytes Absolute 1.5 (*)    Abs Immature Granulocytes 0.11 (*)    All other components within normal limits  URINALYSIS, ROUTINE W REFLEX MICROSCOPIC - Abnormal; Notable for the following components:   APPearance HAZY (*)    pH 9.0 (*)    Hgb urine dipstick SMALL (*)    Protein, ur 100 (*)    Leukocytes,Ua MODERATE (*)    Bacteria, UA RARE (*)    Non Squamous Epithelial 0-5 (*)    All other components within normal limits  VALPROIC ACID LEVEL - Abnormal; Notable for the following components:   Valproic Acid Lvl <10 (*)    All other components within normal limits  HEMOGLOBIN AND HEMATOCRIT, BLOOD - Abnormal; Notable for the following components:   Hemoglobin 8.2 (*)    HCT 26.6 (*)    All other components within normal limits  POC OCCULT BLOOD, ED - Abnormal; Notable for the following components:   Fecal Occult Bld POSITIVE (*)    All other components within normal limits  TROPONIN I (HIGH SENSITIVITY) - Abnormal; Notable for the following components:   Troponin I (High Sensitivity) 27 (*)     All other components within normal limits  TROPONIN I (HIGH SENSITIVITY) - Abnormal; Notable for the following components:   Troponin I (High Sensitivity) 27 (*)    All other components within normal limits  RESP PANEL BY RT-PCR (FLU A&B, COVID) ARPGX2  CULTURE, BLOOD (ROUTINE X 2)  CULTURE, BLOOD (ROUTINE X 2)  URINE CULTURE  LACTIC ACID, PLASMA  LACTIC ACID, PLASMA  PROTIME-INR  APTT  AMMONIA  CBC  BASIC METABOLIC PANEL  TYPE AND SCREEN  PREPARE RBC (CROSSMATCH)  PREPARE RBC (CROSSMATCH)    EKG EKG Interpretation  Date/Time:  Sunday November 15 2021 12:25:47 EST Ventricular Rate:  83 PR Interval:  188 QRS Duration: 98 QT Interval:  415 QTC Calculation: 488 R Axis:   53 Text Interpretation: Sinus rhythm LVH with secondary repolarization abnormality Borderline prolonged QT interval No significant change since last tracing Confirmed by Aletta Edouard [phone]) on 11/15/2021 12:36:02 PM  Radiology CT Head Wo Contrast  Result Date: 11/15/2021 CLINICAL DATA:  BIB EMS for code sepsis from [facility]. At baseline pt is A&Ox4 and can transfer from bed to w/c. Last night pt became increasingly lethargic and not acting her norm, staff called EMS today for fever, minimal verbal response and acute change from baseline EXAM: CT HEAD WITHOUT CONTRAST TECHNIQUE: Contiguous axial images were obtained from the base of the skull through the vertex without intravenous contrast. RADIATION DOSE REDUCTION: This exam was performed according to the departmental dose-optimization program which includes automated exposure control, adjustment of the mA and/or kV according to patient size and/or use of iterative reconstruction technique. COMPARISON:  08/07/2021. FINDINGS: Brain:  No evidence of acute infarction, hemorrhage, hydrocephalus, extra-axial collection or mass lesion/mass effect. There is ventricular sulcal enlargement reflecting mild diffuse atrophy. Patchy white matter hypoattenuation is noted  bilaterally consistent with moderate chronic microvascular ischemic change. These findings are stable. Vascular: No hyperdense vessel or unexpected calcification. Skull: Normal. Negative for fracture or focal lesion. Sinuses/Orbits: Visualized globes and orbits are unremarkable. There are findings of previous sinus surgery on the right with ethmoidectomies and a medial antrectomy. Dependent fluid noted in the right sphenoid sinus. Remaining sinuses are clear. Other: None. IMPRESSION: 1. No acute intracranial abnormalities. 2. Mild atrophy and moderate chronic microvascular ischemic change. 3. Fluid level in the right sphenoid sinus. Consider acute sinusitis in the proper clinical setting. Electronically Signed   By: Lajean Manes M.D.   On: 11/15/2021 14:43   DG Chest Port 1 View  Result Date: 11/15/2021 CLINICAL DATA:  Sepsis. EXAM: PORTABLE CHEST 1 VIEW COMPARISON:  November 02, 2021. FINDINGS: Stable cardiomediastinal silhouette. Both lungs are clear. The visualized skeletal structures are unremarkable. IMPRESSION: No active disease. Electronically Signed   By: Marijo Conception M.D.   On: 11/15/2021 13:42    Procedures .Critical Care Performed by: Hayden Rasmussen, MD Authorized by: Hayden Rasmussen, MD   Critical care provider statement:    Critical care time (minutes):  45   Critical care time was exclusive of:  Separately billable procedures and treating other patients   Critical care was necessary to treat or prevent imminent or life-threatening deterioration of the following conditions:  Circulatory failure   Critical care was time spent personally by me on the following activities:  Development of treatment plan with patient or surrogate, discussions with consultants, evaluation of patient's response to treatment, examination of patient, obtaining history from patient or surrogate, ordering and performing treatments and interventions, ordering and review of laboratory studies, ordering and  review of radiographic studies, pulse oximetry, re-evaluation of patient's condition and review of old charts   I assumed direction of critical care for this patient from another provider in my specialty: no      Medications Ordered in ED Medications  0.9 %  sodium chloride infusion (10 mL/hr Intravenous Not Given 11/15/21 1640)  acetaminophen (TYLENOL) tablet 650 mg (has no administration in time range)  aspirin chewable tablet 81 mg (81 mg Oral Given 11/15/21 1641)  HYDROmorphone (DILAUDID) tablet 1-2 mg (has no administration in time range)  carvedilol (COREG) tablet 3.125 mg (3.125 mg Oral Given 11/15/21 1641)  hydrALAZINE (APRESOLINE) tablet 50 mg (has no administration in time range)  rosuvastatin (CRESTOR) tablet 40 mg (has no administration in time range)  amitriptyline (ELAVIL) tablet 10 mg (10 mg Oral Given 11/15/21 1641)  hydrOXYzine (ATARAX) tablet 25 mg (has no administration in time range)  LORazepam (ATIVAN) tablet 0.5 mg (has no administration in time range)  dicyclomine (BENTYL) tablet 20 mg (20 mg Oral Given 11/15/21 1641)  dronabinol (MARINOL) capsule 2.5 mg (2.5 mg Oral Given 11/15/21 1642)  magnesium oxide (MAG-OX) tablet 400 mg (400 mg Oral Given 11/15/21 1642)  polyethylene glycol (MIRALAX / GLYCOLAX) packet 17 g (17 g Oral Patient Refused/Not Given 11/15/21 1640)  sodium bicarbonate tablet 1,300 mg (1,300 mg Oral Given 11/15/21 1641)  ferrous sulfate tablet 325 mg (has no administration in time range)  pregabalin (LYRICA) capsule 75 mg (has no administration in time range)  valproic acid (DEPAKENE) 250 MG capsule 250 mg (has no administration in time range)  lactose free nutrition (BOOST PLUS) liquid 237  mL (237 mLs Oral Not Given 11/15/21 1632)  multivitamin with minerals tablet 1 tablet (has no administration in time range)  albuterol (PROVENTIL) (2.5 MG/3ML) 0.083% nebulizer solution 3 mL (has no administration in time range)  lidocaine (LIDODERM) 5 % 1 patch (1 patch  Transdermal Patient Refused/Not Given 11/15/21 1642)  diclofenac Sodium (VOLTAREN) 1 % topical gel 2 g (has no administration in time range)  pantoprazole (PROTONIX) injection 40 mg (40 mg Intravenous Given 11/15/21 1639)  umeclidinium bromide (INCRUSE ELLIPTA) 62.5 MCG/ACT 1 puff (has no administration in time range)  fluticasone furoate-vilanterol (BREO ELLIPTA) 100-25 MCG/ACT 1 puff (has no administration in time range)  lactated ringers bolus 500 mL (0 mLs Intravenous Stopped 11/15/21 1322)  0.9 %  sodium chloride infusion (Manually program via Guardrails IV Fluids) ( Intravenous New Bag/Given 11/15/21 1615)    ED Course/ Medical Decision Making/ A&P Clinical Course as of 11/15/21 2217  Sun Nov 15, 2021  1328 Family member here now who is giving little bit extra history.  He said she has been count a week for the last 2 days not taking her medications and been more sleepy. [MB]  1328 Hemoglobin came back at 5.4.  Rectal exam done with tech as chaperone.  Normal tone no masses dark stool.  Sent to lab for guaiac. [MB]  8338 Chest x-ray interpreted by me as no acute infiltrates.  Awaiting radiology reading. [MB]  1435 Reviewed results with patient and she is agreeable to transfusion. [MB]  2505 Discussed  with [facility] PA Zehr.  She said they would probably see her tomorrow unless unstable [MB]  1445 Discussed with Dr. Roosevelt Locks [facility] hospitalist who knew the patient from prior admission.  He will evaluate her for admission. [MB]    Clinical Course User Index [MB] Hayden Rasmussen, MD                           Medical Decision Making Amount and/or Complexity of Data Reviewed Labs: ordered. Radiology: ordered.  Risk Prescription drug management. Decision regarding hospitalization.  This patient complains of lethargy; this involves an extensive number of treatment Options and is a complaint that carries with it a high risk of complications and Morbidity. The differential includes sepsis,  Sirs, dehydration, infection, GI bleed, anemia  I ordered, reviewed and interpreted labs, which included CBC with elevated white count, critically low hemoglobin, chemistries with worsening renal failure, mildly elevated LFTs, fecal occult positive, troponins elevated but flat, urinalysis with possible signs of infection, COVID and flu negative I ordered medication IV fluids, transfusion of packed red blood cells I ordered imaging studies which included chest x-ray and head CT and I independently    visualized and interpreted imaging which showed chronic changes no acute Additional history obtained from patient's family member and updated on plan for admission Previous records obtained and reviewed patient's recent admission and discharge summary, sounds like she is moving towards hospice although currently on palliative I consulted Dr. Roosevelt Locks [facility] hospitalist and discussed lab and imaging findings  Critical Interventions: Transfusion of packed red blood cells for acute anemia symptomatic  After the interventions stated above, I reevaluated the patient and found patient be otherwise hemodynamically stable.  She will need to be admitted to the hospital for transfusion.  Likely will need discussion with hospice regarding de-escalation of care.          Final Clinical Impression(s) / ED Diagnoses Final diagnoses:  Altered mental status,  unspecified altered mental status type  Symptomatic anemia  Gastrointestinal hemorrhage, unspecified gastrointestinal hemorrhage type    Rx / DC Orders ED Discharge Orders     None         Hayden Rasmussen, MD 11/15/21 2223

## 2021-11-15 NOTE — Consult Note (Signed)
Consultation Note Date: 11/15/2021   [name]  DOB: Jun 30, 1941  MRN: [id number]  Age / Sex: 81 y.o., female  PCP: Pcp, No Referring Physician: Lequita Halt, MD  Reason for Consultation: Establishing goals of care  HPI/Patient Profile: 81 y.o. 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 Hemoglobin 5.4 and guaiac test positive in the ED. Admitted to [facility] with symptomatic anemia secondary to acute GI bleed.   Hospitalized 1/4 - 1/7 with probable pyelonephritis, right groin pseudoaneurysm, PAD, and CKD stage IV. She was seen by vascular surgery  and deemed not a candidate for surgical repair of the pseudoaneurysm. She had been previously deemed not a candidate for dialysis. She was discharged home with hospice ([facility]).  Hospitalized 1/16 - 1/26; presented with shortness of breath and found to have a COPD exacerbation. Discharged to SNF with palliative care ([facility]).    Clinical Assessment and Goals of Care: I have reviewed medical records including EPIC notes, labs and imaging, and went to see patient in the ED. She is oriented to self only. She appears acutely and chronically ill. Receiving a transfusion of PRBCs.   I spoke with her son/[name] by phone to discuss diagnosis, prognosis, GOC, EOL wishes, disposition, and options. Patient is well known to PMT; she has been seen multiple times over several hospitalizations. I re-introduced Palliative Medicine as specialized medical care for people living with serious illness. It focuses on providing relief from the symptoms and stress of a serious illness.   Life review (from my previous consult note): Patient is originally from the  [place] area. 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 She has never been married. She had 2 sons. Sadly, her son [name] died here at [facility] in 01/23/21.   As far as functional and nutritional status, there has been a significant decline over the past few weeks. When this NP saw patient on 10/23/21, she was moderately independent with ambulation and ADLs. Prior to this admission 1/4, she was living alone in her home. After her hospitalization 1/16-1/26, she was not well enough to return home and subsequently discharged to SNF/rehab.   We discussed her current illness and what it means in the larger context of her ongoing co-morbidities.  Discussed that patient was admitted with anemia secondary to acute GI bleed and would not be a good candidate for interventions such as EGD. Discussed that she would be managed conservatively;  transfused 2 units PRBCs and started on PPI.  Reviewed patient's medical course over the past few weeks. Natural disease trajectory of chronic illness was discussed, emphasizing that she has multiple non-curable and progressive illnesses. Discussed that functional status decreases over time, as patients do not usually return to previous baseline after a major illness or hospitalization. I expressed concern that in the setting of  recent and significant decline, patient is failing to thrive and may be approaching EOL. [name] verbalizes understanding and agrees with this assessment.   The difference between full scope medical intervention and comfort care was considered.  I reviewed the concept of a comfort path with [name], emphasizing that this path involves de-escalating and stopping full scope medical interventions, allowing a natural course to occur. Discussed that the goal is comfort and dignity rather than cure/prolonging life. [name] indicates he is open to the concept of comfort care.   At this time son would like to continue current medical interventions with watchful  waiting. If patient continues to decline or is not improving, son is open to continue [place] conversations around transition to full comfort care at that time.  Primary decision maker: son, Alyssa [name]    SUMMARY OF RECOMMENDATIONS   Continue current supportive care with watchful waiting for 24 hours Recommend transition to comfort care if patient does not improve or in the event of further decline - son is receptive of this PMT will follow-up tomorrow   Code Status/Advance Care Planning: DNR  Prognosis:  Overall poor  Discharge Planning: To Be Determined      Primary Diagnoses: Present on Admission:  Anemia   I have reviewed the medical record, interviewed the patient and family, and examined the patient. The following aspects are pertinent.  Past Medical History:  Diagnosis Date   Anxiety    Arthritis    Chronic kidney disease    Headache(784.0)    Hyperlipidemia    Hypertension    Peripheral neuropathy    Peripheral vascular disease ([place])    Syncope 09/2020    Scheduled Meds:  sodium chloride   Intravenous Once   amitriptyline  10 mg Oral Daily   aspirin  81 mg Oral Daily   Budeson-Glycopyrrol-Formoterol  2 puff Inhalation BID   carvedilol  3.125 mg Oral BID WC   dicyclomine  20 mg Oral TID   dronabinol  2.5 mg Oral BID AC   Ensure Plus  237 mL Oral Daily   [START ON 11/16/2021] ferrous sulfate  325 mg Oral Q breakfast   hydrOXYzine  25 mg Oral QHS   lidocaine  1 patch Transdermal Daily   magnesium oxide  400 mg Oral BID   multivitamin with minerals  1 tablet Oral QHS   pantoprazole (PROTONIX) IV  40 mg Intravenous Once   pantoprazole (PROTONIX) IV  40 mg Intravenous Q12H   polyethylene glycol  17 g Oral Daily   [START ON 11/16/2021] pregabalin  75 mg Oral Daily   rosuvastatin  40 mg Oral QPM   sodium bicarbonate  1,300 mg Oral BID   sodium zirconium cyclosilicate  10 g Oral TID   valproic acid  250 mg Oral QHS   Continuous Infusions:  sodium  chloride     PRN Meds:.acetaminophen, albuterol, diclofenac Sodium, hydrALAZINE, HYDROmorphone, LORazepam   No Known Allergies Review of Systems  Unable to perform ROS: Mental status change   Physical Exam Vitals reviewed.  Constitutional:      General: She is not in acute distress.    Appearance: She is cachectic. She is ill-appearing.  Cardiovascular:     Rate and Rhythm: Normal rate.  Pulmonary:     Effort: Pulmonary effort is normal.     Comments: On room air Neurological:     Mental Status: She is lethargic and disoriented.     Motor: Weakness present.     Comments: Oriented to  self only    Vital Signs: BP (!) 160/88    Pulse 87    Temp (!) 97 F (36.1 C) (Rectal)    Resp 14    Ht 5\' 3"  (1.6 m)    Wt 43 kg    SpO2 99%    BMI 16.79 kg/m  Pain Scale: PAINAD      SpO2: SpO2: 99 % O2 Device:SpO2: 99 % O2 Flow Rate: .   Baseline Weight: Weight: 43 kg Most recent weight: Weight: 43 kg      Palliative Assessment/Data: PPS 10-20%     MDM - High due to: 1 or more chronic illnesses with severe exacerbation, progression, or side effects of treatment OR acute or chronic illness or injury that poses a threat to life or bodily function Review of prior external notes, review of test results, assessment requiring an independent historian Discussion or decision not to resuscitate or to de-escalate care because poor prognosis   Signed by: Lavena Bullion, NP   Please contact Palliative Medicine Team phone at [phone] for questions and concerns.  For individual provider: See Shea Evans

## 2021-11-15 NOTE — ED Notes (Addendum)
Pt tolerating blood transfusion without difficulty, no sx of transfusion reaction or fluid overload. Continue to monitor. Pt resting in stretcher with eyes closed, call light in reach.

## 2021-11-16 ENCOUNTER — Encounter (HOSPITAL_COMMUNITY): Payer: Self-pay | Admitting: Internal Medicine

## 2021-11-16 DIAGNOSIS — I729 Aneurysm of unspecified site: Secondary | ICD-10-CM | POA: Diagnosis not present

## 2021-11-16 DIAGNOSIS — N184 Chronic kidney disease, stage 4 (severe): Secondary | ICD-10-CM | POA: Diagnosis not present

## 2021-11-16 DIAGNOSIS — D649 Anemia, unspecified: Secondary | ICD-10-CM | POA: Diagnosis not present

## 2021-11-16 DIAGNOSIS — K922 Gastrointestinal hemorrhage, unspecified: Secondary | ICD-10-CM | POA: Diagnosis not present

## 2021-11-16 LAB — CBC
HCT: 30.5 % — ABNORMAL LOW (ref 36.0–46.0)
Hemoglobin: 10.8 g/dL — ABNORMAL LOW (ref 12.0–15.0)
MCH: 31.1 pg (ref 26.0–34.0)
MCHC: 35.4 g/dL (ref 30.0–36.0)
MCV: 87.9 fL (ref 80.0–100.0)
Platelets: 355 10*3/uL (ref 150–400)
RBC: 3.47 MIL/uL — ABNORMAL LOW (ref 3.87–5.11)
RDW: 16.2 % — ABNORMAL HIGH (ref 11.5–15.5)
WBC: 10.2 10*3/uL (ref 4.0–10.5)
nRBC: 0.3 % — ABNORMAL HIGH (ref 0.0–0.2)

## 2021-11-16 LAB — BASIC METABOLIC PANEL
Anion gap: 12 (ref 5–15)
BUN: 40 mg/dL — ABNORMAL HIGH (ref 8–23)
CO2: 24 mmol/L (ref 22–32)
Calcium: 8.5 mg/dL — ABNORMAL LOW (ref 8.9–10.3)
Chloride: 106 mmol/L (ref 98–111)
Creatinine, Ser: 2.79 mg/dL — ABNORMAL HIGH (ref 0.44–1.00)
GFR, Estimated: 17 mL/min — ABNORMAL LOW (ref >60)
Glucose, Bld: 74 mg/dL (ref 70–99)
Potassium: 4.1 mmol/L (ref 3.5–5.1)
Sodium: 142 mmol/L (ref 135–145)

## 2021-11-16 LAB — URINE CULTURE

## 2021-11-16 MED ORDER — HALOPERIDOL LACTATE 5 MG/ML IJ SOLN: 0.5000 mg | INTRAMUSCULAR | Status: DC | PRN

## 2021-11-16 MED ORDER — GLYCOPYRROLATE 0.2 MG/ML IJ SOLN
0.2000 mg | INTRAMUSCULAR | Status: DC | PRN
  Administered 2021-11-17: 0.2 mg via INTRAVENOUS
  Filled 2021-11-16: qty 1

## 2021-11-16 MED ORDER — HALOPERIDOL LACTATE 2 MG/ML PO CONC
0.5000 mg | ORAL | Status: DC | PRN
  Filled 2021-11-16: qty 0.3

## 2021-11-16 MED ORDER — LORAZEPAM 1 MG PO TABS
1.0000 mg | ORAL_TABLET | ORAL | Status: DC | PRN
  Administered 2021-11-17: 1 mg via ORAL
  Filled 2021-11-16: qty 1

## 2021-11-16 MED ORDER — LORAZEPAM 2 MG/ML PO CONC: 1.0000 mg | ORAL | Status: DC | PRN

## 2021-11-16 MED ORDER — HALOPERIDOL 0.5 MG PO TABS
0.5000 mg | ORAL_TABLET | ORAL | Status: DC | PRN
  Filled 2021-11-16: qty 1

## 2021-11-16 MED ORDER — BIOTENE DRY MOUTH MT LIQD: 15.0000 mL | OROMUCOSAL | Status: DC | PRN

## 2021-11-16 MED ORDER — ONDANSETRON HCL 4 MG/2ML IJ SOLN: 4.0000 mg | Freq: Four times a day (QID) | INTRAMUSCULAR | Status: DC | PRN

## 2021-11-16 MED ORDER — ONDANSETRON 4 MG PO TBDP
4.0000 mg | ORAL_TABLET | Freq: Four times a day (QID) | ORAL | Status: DC | PRN
  Filled 2021-11-16: qty 1

## 2021-11-16 MED ORDER — POLYVINYL ALCOHOL 1.4 % OP SOLN
1.0000 [drp] | Freq: Four times a day (QID) | OPHTHALMIC | Status: DC | PRN
Start: 2021-11-16 — End: 2021-11-18

## 2021-11-16 MED ORDER — GLYCOPYRROLATE 1 MG PO TABS
1.0000 mg | ORAL_TABLET | ORAL | Status: DC | PRN
  Filled 2021-11-16: qty 1

## 2021-11-16 MED ORDER — LORAZEPAM 2 MG/ML IJ SOLN: 1.0000 mg | INTRAMUSCULAR | Status: DC | PRN

## 2021-11-16 MED ORDER — HYDROMORPHONE HCL 1 MG/ML IJ SOLN
0.5000 mg | Freq: Four times a day (QID) | INTRAMUSCULAR | Status: DC
  Administered 2021-11-16 – 2021-11-17 (×4): 0.5 mg via INTRAVENOUS
  Filled 2021-11-16 (×5): qty 0.5

## 2021-11-16 MED ORDER — GLYCOPYRROLATE 0.2 MG/ML IJ SOLN: 0.2000 mg | INTRAMUSCULAR | Status: DC | PRN

## 2021-11-16 NOTE — TOC Progression Note (Signed)
Transition of Care [facility]) - Progression Note    Patient Details  Name: Alyssa [name] MRN: [id number] Date of Birth: [birth year]  Transition of Care [facility]) CM/SW Contact  Zenon Mayo, RN Phone Number: 11/16/2021, 9:50 AM  Clinical Narrative:     Transition of Care [facility]) Screening Note   Patient Details  Name: Alyssa [name] Date of Birth: 05-Jun-1941   Transition of Care [facility]) CM/SW Contact:    Zenon Mayo, RN Phone Number: 11/16/2021, 9:50 AM    Transition of Care Department [facility]) has reviewed patient and no TOC needs have been identified at this time. We will continue to monitor patient advancement through interdisciplinary progression rounds. If new patient transition needs arise, please place a TOC consult.   Patient is from [facility] SNF, anemia and AMS, had 2 units, hgb is 10.8 today.  Palliative following, will watch 24 more hrs, to see if will improve.  TOC will continue to follow.         Expected Discharge Plan and Services                                                 Social Determinants of Health (SDOH) Interventions    Readmission Risk Interventions Readmission Risk Prevention Plan 11/06/2021 10/09/2021 07/10/2020  Transportation Screening Complete Complete Complete  Medication Review [employer]) Complete Complete Complete  PCP or Specialist appointment within 3-5 days of discharge Complete Complete -  [place] or Home Care Consult Complete Complete Complete  SW Recovery Care/Counseling Consult Complete Complete Complete  Palliative Care Screening Complete Not Applicable Not Applicable  Skilled Nursing Facility Complete Not Applicable Not Applicable  Some recent data might be hidden

## 2021-11-16 NOTE — Progress Notes (Signed)
PROGRESS NOTE    Alyssa [name]  JOA:[id number] DOB: Aug 22, 1941 DOA: 11/15/2021 PCP: Pcp, No    Brief Narrative:  81 year old female with history of hypertension, hyperlipidemia, peripheral vascular disease on Plavix, CKD stage IV, COPD, advanced dementia, right groin pseudoaneurysm, severe protein calorie malnutrition sent from nursing home for worsening mentation.  Patient was reportedly feeling lethargic since 1 day.  Patient was hard to arouse so EMS was called and patient was sent to ER.  Blood pressure stable.  Heart rate stable.  Temperature one 1.1.  Hemoglobin 5.4 previous hemoglobin 8.9.  Admitted for symptomatic treatment.   Assessment & Plan:  Symptomatic anemia likely secondary to acute or subacute GI bleeding: Patient on Plavix.  Multiple chronic medical issues. Patient does have history of diverticulosis however no overt rectal bleeding.  Patient is in very poor clinical status for endoscopic evaluation. Symptomatic relief with 2 units of PRBC, hemoglobin appropriately responded.  Continue PPI.  Currently on clear liquid diet, will advance. Patient at about end-of-life, does not benefit with aspirin, Plavix for long-term outcome, discontinue.  Essential hypertension: Continue beta-blockers.  Holding amlodipine.  Peripheral vascular disease, right groin pseudoaneurysm: Conservative management.  Not a surgical candidate as per vascular surgery.  CKD stage IV: Euvolemic.  Patient is at about baseline.  Chronic pain syndrome, adult failure to thrive, severe protein calorie malnutrition, dementia, COPD: Patient with multiple systemic illnesses and currently suffering from multiple hospitalizations with no improvement with conservative management. Enrolled in palliative care at a skilled nursing facility. Given failure to thrive, no clinical improvement despite optimal medical therapy may benefit with follow-up with hospice. Appreciate palliative care  involvement.  Patient is medically stabilizing.  Palliative care following.  Will explore if patient qualifies for hospice enrollment. Probable discharge back to skilled nursing facility with palliative care follow-up.   DVT prophylaxis: SCDs Start: 11/15/21 1509   Code Status: DNR Family Communication: Patient's son, called unable to talk.  Palliative on board. Disposition Plan: Status is: Inpatient  Remains inpatient appropriate because: Symptomatic anemia.  Discharge disposition pending.         Consultants:  Palliative care  Procedures:  None  Antimicrobials:  None   Subjective: Patient seen and examined.  No overnight events.  She is pleasantly confused.  Looks very comfortable. denies any complaints to me.  She complained of leg pain to the nurses. Tolerating liquids.  Will advance diet.  Objective: Vitals:   11/16/21 0430 11/16/21 0445 11/16/21 0524 11/16/21 0907  BP: (!) 144/93     Pulse: 71     Resp:  16    Temp:  97.6 F (36.4 C)    TempSrc:  Oral    SpO2: 97%   97%  Weight:   42.2 kg   Height:        Intake/Output Summary (Last 24 hours) at 11/16/2021 1118 Last data filed at 11/16/2021 0715 Gross per 24 hour  Intake 1400 ml  Output 900 ml  Net 500 ml   Filed Weights   11/15/21 1229 11/15/21 1827 11/16/21 0524  Weight: 43 kg 41.2 kg 42.2 kg    Examination:  General: Chronically sick looking and debilitated.  Not in any distress.  On room air. Patient is alert and awake but oriented x1-2.  Flat affect. Cardiovascular: S1-S2 heard.  Regular rate rhythm. Respiratory: Bilateral clear.  No added sounds. Gastrointestinal: Soft.  Nontender.  Bowel sound present. Ext: No swelling or edema.  No cyanosis.   Data Reviewed: I have personally  reviewed following labs and imaging studies  CBC: Recent Labs  Lab 11/15/21 1255 11/15/21 1956 11/16/21 0505  WBC 11.5*  --  10.2  NEUTROABS 7.3  --   --   HGB 5.4* 8.2* 10.8*  HCT 17.8* 26.6* 30.5*   MCV 91.3  --  87.9  PLT 474*  --  161   Basic Metabolic Panel: Recent Labs  Lab 11/10/21 0226 11/15/21 1255 11/16/21 0505  NA  --  144 142  K 5.2* 4.2 4.1  CL  --  107 106  CO2  --  28 24  GLUCOSE  --  109* 74  BUN  --  48* 40*  CREATININE  --  3.25* 2.79*  CALCIUM  --  8.8* 8.5*   GFR: Estimated Creatinine Clearance: 11.1 mL/min (A) (by C-G formula based on SCr of 2.79 mg/dL (H)). Liver Function Tests: Recent Labs  Lab 11/15/21 1255  AST 47*  ALT 54*  ALKPHOS 100  BILITOT 0.4  PROT 6.2*  ALBUMIN 3.2*   No results for input(s): LIPASE, AMYLASE in the last 168 hours. Recent Labs  Lab 11/15/21 1440  AMMONIA <10   Coagulation Profile: Recent Labs  Lab 11/15/21 1255  INR 0.9   Cardiac Enzymes: No results for input(s): CKTOTAL, CKMB, CKMBINDEX, TROPONINI in the last 168 hours. BNP (last 3 results) No results for input(s): PROBNP in the last 8760 hours. HbA1C: No results for input(s): HGBA1C in the last 72 hours. CBG: No results for input(s): GLUCAP in the last 168 hours. Lipid Profile: No results for input(s): CHOL, HDL, LDLCALC, TRIG, CHOLHDL, LDLDIRECT in the last 72 hours. Thyroid Function Tests: No results for input(s): TSH, T4TOTAL, FREET4, T3FREE, THYROIDAB in the last 72 hours. Anemia Panel: No results for input(s): VITAMINB12, FOLATE, FERRITIN, TIBC, IRON, RETICCTPCT in the last 72 hours. Sepsis Labs: Recent Labs  Lab 11/15/21 1255 11/15/21 1440  LATICACIDVEN 1.3 0.9    Recent Results (from the past 240 hour(s))  Resp Panel by RT-PCR (Flu A&B, Covid) Nasopharyngeal Swab     Status: None   Collection Time: 11/11/21  3:16 PM   Specimen: Nasopharyngeal Swab; Nasopharyngeal(NP) swabs in vial transport medium  Result Value Ref Range Status   SARS Coronavirus 2 by RT PCR NEGATIVE NEGATIVE Final    Comment: (NOTE) SARS-CoV-2 target nucleic acids are NOT DETECTED.  The SARS-CoV-2 RNA is generally detectable in upper respiratory specimens during  the acute phase of infection. The lowest concentration of SARS-CoV-2 viral copies this assay can detect is 138 copies/mL. A negative result does not preclude SARS-Cov-2 infection and should not be used as the sole basis for treatment or other patient management decisions. A negative result may occur with  improper specimen collection/handling, submission of specimen other than nasopharyngeal swab, presence of viral mutation(s) within the areas targeted by this assay, and inadequate number of viral copies(<138 copies/mL). A negative result must be combined with clinical observations, patient history, and epidemiological information. The expected result is Negative.  Fact Sheet for Patients:  EntrepreneurPulse.com.au  Fact Sheet for Healthcare Providers:  IncredibleEmployment.be  This test is no t yet approved or cleared by the [place] FDA and  has been authorized for detection and/or diagnosis of SARS-CoV-2 by FDA under an Emergency Use Authorization (EUA). This EUA will remain  in effect (meaning this test can be used) for the duration of the COVID-19 declaration under Section 564(b)(1) of the Act, 21 U.S.C.section 360bbb-3(b)(1), unless the authorization is terminated  or revoked sooner.  Influenza A by PCR NEGATIVE NEGATIVE Final   Influenza B by PCR NEGATIVE NEGATIVE Final    Comment: (NOTE) The Xpert Xpress SARS-CoV-2/FLU/RSV plus assay is intended as an aid in the diagnosis of influenza from Nasopharyngeal swab specimens and should not be used as a sole basis for treatment. Nasal washings and aspirates are unacceptable for Xpert Xpress SARS-CoV-2/FLU/RSV testing.  Fact Sheet for Patients: EntrepreneurPulse.com.au  Fact Sheet for Healthcare Providers: IncredibleEmployment.be  This test is not yet approved or cleared by the [place] FDA and has been authorized for detection and/or  diagnosis of SARS-CoV-2 by FDA under an Emergency Use Authorization (EUA). This EUA will remain in effect (meaning this test can be used) for the duration of the COVID-19 declaration under Section 564(b)(1) of the Act, 21 U.S.C. section 360bbb-3(b)(1), unless the authorization is terminated or revoked.  Performed at [facility], [place] [address] [place], [place] [address]   Resp Panel by RT-PCR (Flu A&B, Covid)     Status: None   Collection Time: 11/15/21 12:37 PM   Specimen: Nasopharyngeal(NP) swabs in vial transport medium  Result Value Ref Range Status   SARS Coronavirus 2 by RT PCR NEGATIVE NEGATIVE Final    Comment: (NOTE) SARS-CoV-2 target nucleic acids are NOT DETECTED.  The SARS-CoV-2 RNA is generally detectable in upper respiratory specimens during the acute phase of infection. The lowest concentration of SARS-CoV-2 viral copies this assay can detect is 138 copies/mL. A negative result does not preclude SARS-Cov-2 infection and should not be used as the sole basis for treatment or other patient management decisions. A negative result may occur with  improper specimen collection/handling, submission of specimen other than nasopharyngeal swab, presence of viral mutation(s) within the areas targeted by this assay, and inadequate number of viral copies(<138 copies/mL). A negative result must be combined with clinical observations, patient history, and epidemiological information. The expected result is Negative.  Fact Sheet for Patients:  EntrepreneurPulse.com.au  Fact Sheet for Healthcare Providers:  IncredibleEmployment.be  This test is no t yet approved or cleared by the [place] FDA and  has been authorized for detection and/or diagnosis of SARS-CoV-2 by FDA under an Emergency Use Authorization (EUA). This EUA will remain  in effect (meaning this test can be used) for the duration of the COVID-19 declaration under Section  564(b)(1) of the Act, 21 U.S.C.section 360bbb-3(b)(1), unless the authorization is terminated  or revoked sooner.       Influenza A by PCR NEGATIVE NEGATIVE Final   Influenza B by PCR NEGATIVE NEGATIVE Final    Comment: (NOTE) The Xpert Xpress SARS-CoV-2/FLU/RSV plus assay is intended as an aid in the diagnosis of influenza from Nasopharyngeal swab specimens and should not be used as a sole basis for treatment. Nasal washings and aspirates are unacceptable for Xpert Xpress SARS-CoV-2/FLU/RSV testing.  Fact Sheet for Patients: EntrepreneurPulse.com.au  Fact Sheet for Healthcare Providers: IncredibleEmployment.be  This test is not yet approved or cleared by the [place] FDA and has been authorized for detection and/or diagnosis of SARS-CoV-2 by FDA under an Emergency Use Authorization (EUA). This EUA will remain in effect (meaning this test can be used) for the duration of the COVID-19 declaration under Section 564(b)(1) of the Act, 21 U.S.C. section 360bbb-3(b)(1), unless the authorization is terminated or revoked.  Performed at [facility], [place] [address] [place], [place] [address]          Radiology Studies: CT Head Wo Contrast  Result Date: 11/15/2021 CLINICAL  DATA:  BIB EMS for code sepsis from [facility]. At baseline pt is A&Ox4 and can transfer from bed to w/c. Last night pt became increasingly lethargic and not acting her norm, staff called EMS today for fever, minimal verbal response and acute change from baseline EXAM: CT HEAD WITHOUT CONTRAST TECHNIQUE: Contiguous axial images were obtained from the base of the skull through the vertex without intravenous contrast. RADIATION DOSE REDUCTION: This exam was performed according to the departmental dose-optimization program which includes automated exposure control, adjustment of the mA and/or kV according to patient size and/or use of iterative reconstruction  technique. COMPARISON:  08/07/2021. FINDINGS: Brain: No evidence of acute infarction, hemorrhage, hydrocephalus, extra-axial collection or mass lesion/mass effect. There is ventricular sulcal enlargement reflecting mild diffuse atrophy. Patchy white matter hypoattenuation is noted bilaterally consistent with moderate chronic microvascular ischemic change. These findings are stable. Vascular: No hyperdense vessel or unexpected calcification. Skull: Normal. Negative for fracture or focal lesion. Sinuses/Orbits: Visualized globes and orbits are unremarkable. There are findings of previous sinus surgery on the right with ethmoidectomies and a medial antrectomy. Dependent fluid noted in the right sphenoid sinus. Remaining sinuses are clear. Other: None. IMPRESSION: 1. No acute intracranial abnormalities. 2. Mild atrophy and moderate chronic microvascular ischemic change. 3. Fluid level in the right sphenoid sinus. Consider acute sinusitis in the proper clinical setting. Electronically Signed   By: Lajean Manes M.D.   On: 11/15/2021 14:43   DG Chest Port 1 View  Result Date: 11/15/2021 CLINICAL DATA:  Sepsis. EXAM: PORTABLE CHEST 1 VIEW COMPARISON:  November 02, 2021. FINDINGS: Stable cardiomediastinal silhouette. Both lungs are clear. The visualized skeletal structures are unremarkable. IMPRESSION: No active disease. Electronically Signed   By: Marijo Conception M.D.   On: 11/15/2021 13:42        Scheduled Meds:  amitriptyline  10 mg Oral Daily   carvedilol  3.125 mg Oral BID WC   dicyclomine  20 mg Oral TID   dronabinol  2.5 mg Oral BID AC   ferrous sulfate  325 mg Oral Q breakfast   fluticasone furoate-vilanterol  1 puff Inhalation Daily   hydrOXYzine  25 mg Oral QHS   lactose free nutrition  237 mL Oral Daily   lidocaine  1 patch Transdermal Daily   magnesium oxide  400 mg Oral BID   multivitamin with minerals  1 tablet Oral QHS   pantoprazole (PROTONIX) IV  40 mg Intravenous Q12H   polyethylene  glycol  17 g Oral Daily   pregabalin  75 mg Oral Daily   rosuvastatin  40 mg Oral QPM   sodium bicarbonate  1,300 mg Oral BID   umeclidinium bromide  1 puff Inhalation Daily   valproic acid  250 mg Oral QHS   Continuous Infusions:  sodium chloride       LOS: 1 day    Time spent: 35 minutes    Barb Merino, MD [facility] Pager [phone]

## 2021-11-16 NOTE — Social Work (Signed)
CSW contacted [facility] place for referral for residential  Hospice. CSW will continue to follow.

## 2021-11-16 NOTE — Progress Notes (Signed)
Daily Progress Note   [name]       Date: 11/16/2021 DOB: [birth year]  Age: 81 y.o. MRN#: [id number] Attending Physician: Barb Merino, MD Primary Care Physician: Pcp, No Admit Date: 11/15/2021    HPI/Patient Profile: 81 y.o. 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 Hemoglobin 5.4 and guaiac test positive in the ED. Admitted to [facility] with symptomatic anemia secondary to acute GI bleed.    Hospitalized 1/4 - 1/7 with probable pyelonephritis, right groin pseudoaneurysm, PAD, and CKD stage IV. She was seen by vascular surgery  and deemed not a candidate for surgical repair of the pseudoaneurysm. She had been previously deemed not a candidate for dialysis. She was discharged home with hospice ([facility]).  Hospitalized 1/16 - 1/26; presented with shortness of breath and found to have a COPD exacerbation. Discharged to SNF with palliative care ([facility]).     Subjective: Chart reviewed. Hemoglobin 10.8. Received update from bedside RN - patient eating/drinking very little and only with maximum encouragement and cueing from nursing staff.    I went to see patient at bedside. She is more alert compared to yesterday. Still appears very weak and frail. She denies pain, but when I asked her how she feels, she states "not too good". She endorses poor appetite.   I spoke with her son/[name] by phone. I updated him that Alyssa [name] clinically appears improved today after receiving 2 units PRBCs yesterday. Expressed concern however, that she is still failing to thrive with minimal  PO intake and appears to be approaching EOL. Discussed that if Alyssa [name] discharges back to SNF, it will only be a matter of time before she has another illness/event/complication related to her underlying co-morbidities and overall frail condition.    Again reviewed concept of comfort care, emphasizing that this path involves de-escalating and stopping full scope medical interventions, allowing a natural course to occur. Discussed that the goal is comfort and dignity rather than cure/prolonging life.  Discussed transitioning to comfort care in the hospital, and what that would look like--keeping her clean and dry, no labs, no artificial hydration or feeding, no antibiotics,  minimizing of medications, comfort feeds, and medication for pain and dyspnea.   Alyssa [name] verbalizes understanding of comfort care and agrees with this transition. Also discussed the option to transfer to a residential hospice facility for a more peaceful setting at EOL. Alyssa [name] agrees and states his preference for [facility].   Plan of care discussed with Dr. Sloan Leiter, [facility] team, and bedside RN.    Objective:  Physical Exam Vitals reviewed.  Constitutional:      General: She is not in acute distress.    Appearance: She is cachectic. She is ill-appearing.  Cardiovascular:     Rate and Rhythm: Normal rate.  Pulmonary:     Effort: Pulmonary effort is normal.  Neurological:     Mental Status: She is alert.     Motor: Weakness present.            Vital Signs: BP (!) 144/93    Pulse 71    Temp 97.6 F (36.4 C) (Oral)    Resp 16    Ht 5\' 2"  (1.575 m)    Wt 42.2 kg    SpO2 97%    BMI 17.02 kg/m  SpO2: SpO2: 97 % O2 Device: O2 Device: Room Air O2 Flow Rate:      LBM: Last BM Date: 11/16/21 Baseline Weight: Weight: 43 kg Most recent weight: Weight: 42.2 kg       Palliative Assessment/Data: PPS 20%        Palliative Care Assessment & Plan   Assessment: - symptomatic anemia likely secondary to acute or subacute  GI bleed - CKD stage IV (not a candidate for HD) - chronic pain syndrome - PVD, right groin pseudoaneurysm (not a candidate for vascular intervention) - failure to thrive  Recommendations/Plan: Full comfort measures initiated Transfer to [facility] pending confirmation of eligibility and bed availability  Added orders for symptom management at EOL as well as discontinued orders that were not focused on comfort PRN medications are available for symptom management at EOL PMT will continue to follow   Symptom Management:  Scheduled Dilaudid 0.5 mg IV every 6 hours Lorazepam (ATIVAN) prn for anxiety Haloperidol (HALDOL) prn for agitation  Glycopyrrolate (ROBINUL) for excessive secretions Ondansetron (ZOFRAN) prn for nausea Polyvinyl alcohol (LIQUIFILM TEARS) prn for dry eyes Antiseptic oral rinse (BIOTENE) prn for dry mouth  Code Status: DNR/DNI  Prognosis:  < 2 weeks  Discharge Planning: To Be Determined   Thank you for allowing the Palliative Medicine Team to assist in the care of this patient.   MDM - High due to: 1 or more chronic illnesses with severe exacerbation, progression, or side effects of treatment OR acute or chronic illness or injury that poses a threat to life or bodily function Review of prior external notes, review of test results, assessment requiring an independent historian Discussion or decision not to resuscitate or to de-escalate care because poor prognosis Parenteral controlled substances  Lavena Bullion, NP  Please contact Palliative Medicine Team phone at [phone] for questions and concerns.

## 2021-11-17 DIAGNOSIS — R0681 Apnea, not elsewhere classified: Secondary | ICD-10-CM

## 2021-11-17 DIAGNOSIS — K922 Gastrointestinal hemorrhage, unspecified: Secondary | ICD-10-CM | POA: Diagnosis not present

## 2021-11-17 DIAGNOSIS — Z515 Encounter for palliative care: Secondary | ICD-10-CM

## 2021-11-17 DIAGNOSIS — D649 Anemia, unspecified: Secondary | ICD-10-CM

## 2021-11-17 DIAGNOSIS — Z66 Do not resuscitate: Secondary | ICD-10-CM

## 2021-11-17 DIAGNOSIS — R4182 Altered mental status, unspecified: Secondary | ICD-10-CM | POA: Diagnosis not present

## 2021-11-17 DIAGNOSIS — R0602 Shortness of breath: Secondary | ICD-10-CM

## 2021-11-17 DIAGNOSIS — Z789 Other specified health status: Secondary | ICD-10-CM

## 2021-11-17 DIAGNOSIS — R0989 Other specified symptoms and signs involving the circulatory and respiratory systems: Secondary | ICD-10-CM

## 2021-11-17 LAB — BPAM RBC
Blood Product Expiration Date: 202303012359
Blood Product Expiration Date: 202303012359
ISSUE DATE / TIME: 202301291611
ISSUE DATE / TIME: 202301300006
Unit Type and Rh: 5100
Unit Type and Rh: 5100

## 2021-11-17 LAB — TYPE AND SCREEN
ABO/RH(D): O POS
Antibody Screen: NEGATIVE
Unit division: 0
Unit division: 0

## 2021-11-17 MED ORDER — LORAZEPAM 1 MG PO TABS: 1.0000 mg | ORAL_TABLET | ORAL | 0 refills | Status: AC | PRN

## 2021-11-17 MED ORDER — BIOTENE DRY MOUTH MT LIQD: 15.0000 mL | OROMUCOSAL | Status: AC | PRN

## 2021-11-17 MED ORDER — ONDANSETRON 4 MG PO TBDP: 4.0000 mg | ORAL_TABLET | Freq: Four times a day (QID) | ORAL | 0 refills | Status: AC | PRN

## 2021-11-17 MED ORDER — HYDROMORPHONE HCL 1 MG/ML IJ SOLN: 0.5000 mg | INTRAMUSCULAR | 0 refills | Status: AC | PRN

## 2021-11-17 MED ORDER — GLYCOPYRROLATE 1 MG PO TABS: 1.0000 mg | ORAL_TABLET | ORAL | Status: AC | PRN

## 2021-11-17 MED ORDER — HALOPERIDOL 0.5 MG PO TABS
0.5000 mg | ORAL_TABLET | ORAL | Status: AC | PRN
Start: 2021-11-17 — End: ?

## 2021-11-17 MED ORDER — POLYVINYL ALCOHOL 1.4 % OP SOLN: 1.0000 [drp] | Freq: Four times a day (QID) | OPHTHALMIC | 0 refills | Status: AC | PRN

## 2021-11-17 MED ORDER — HYDROMORPHONE HCL 1 MG/ML IJ SOLN
0.5000 mg | INTRAMUSCULAR | Status: DC | PRN
  Administered 2021-11-17 (×2): 0.5 mg via INTRAVENOUS
  Filled 2021-11-17 (×2): qty 0.5
  Filled 2021-11-17: qty 1

## 2021-11-17 MED ORDER — HYDROMORPHONE HCL 1 MG/ML IJ SOLN: 0.5000 mg | Freq: Four times a day (QID) | INTRAMUSCULAR | 0 refills | Status: AC

## 2021-11-17 NOTE — Progress Notes (Signed)
°  Progress Note   Patient: Alyssa [name] SHF:[id number] DOB: 05-Nov-1940 DOA: 11/15/2021     2 DOS: the patient was seen and examined on 11/17/2021       Brief hospital course: Alyssa [name] is a 81 y.o. F with advanced dementia, lives at home, currently in rehab, CKD IV, HTN, PVD on Plavix, COPD and severe PCM who presented with decreased mentation, poorly responsive, reported fever.  In the ER, afebrile, vitals within normal limits. Hgb noted to be 5.4.  Admitted, transufsion of PRBCs started, palliative care consulted.      Assessment and Plan: Symptomatic anemia due to subacute GI bleeding Advanced dementia to thrive Given the patient's overall poor functional status, recurrent admissions, worsening mentation, aggressive workshop was deferred.  Palliative care was consulted and the patient was transitioned to comfort measures.  Given the short life expectancy, aspirin, Plavix, and statin have low utility and will be discontinued.  Depakote may provide some benefit in terms of anxiety, and increased may provide some benefit in terms of breathing so these will be continued.  Other life-extending methods will transition to life-comforting measures, and Hospice has been consulted for post-hospital care.    Hypertension Peripheral vascular disease Chronic pain syndrome Chronic obstructive pulmonary disease Severe protein calorie malnutrition Chronic kidney disease stage IV     Subjective: No concerns from nursing.  The patient is awake and confused and has no complaints.  No fever, respiratory distress  Physical Exam: Vitals:   11/16/21 0445 11/16/21 0524 11/16/21 0907 11/16/21 1938  BP:    (!) 144/89  Pulse:    63  Resp: 16   17  Temp: 97.6 F (36.4 C)   (!) 97.3 F (36.3 C)  TempSrc: Oral   Oral  SpO2:   97% 99%  Weight:  42.2 kg    Height:       Frail elderly female, central alopecia, mild left-sided weakness, confused. Tongue is dry, she is partially  edentulous.  She has a systolic murmur.  Heart rate is normal.  She has no peripheral edema.     Data Reviewed: There are no new results to review at this time.  Family Communication:   Disposition: Status is: Inpatient Remains inpatient appropriate because: She is at the end of life we are reaching hospice care for post discharge.  I suspect she has a prognosis of days to weeks  Planned Discharge Destination:  To be determined              Author: Edwin Dada, MD 11/17/2021 2:23 PM  For on call review [email or web address].

## 2021-11-17 NOTE — Progress Notes (Signed)
Daily Progress Note   [name]       Date: 11/17/2021 DOB: Feb 08, 1941  Age: 81 y.o. MRN#: [id number] Attending Physician: Edwin Dada, * Primary Care Physician: Pcp, No Admit Date: 11/15/2021  Reason for Consultation/Follow-up: Non pain symptom management, Pain control, Psychosocial/spiritual support, and Terminal Care  Subjective: Chart review performed.   Went to visit patient at bedside - no family/visitors present. Patient was lying in bed asleep - she does not wake to voice/gentle touch. She is frail appearing. No signs or non-verbal gestures of pain or discomfort noted. No respiratory distress; increased work of breathing and secretions noted. Short periods of apnea, up to 10 seconds, noted.   Requested RN administer PRN dose of 0.5mg  dilaudid for increased work of breathing and robinul for respiratory secretions.   Length of Stay: 2  Current Medications: Scheduled Meds:   amitriptyline  10 mg Oral Daily   carvedilol  3.125 mg Oral BID WC   dicyclomine  20 mg Oral TID   ferrous sulfate  325 mg Oral Q breakfast   fluticasone furoate-vilanterol  1 puff Inhalation Daily    HYDROmorphone (DILAUDID) injection  0.5 mg Intravenous Q6H   hydrOXYzine  25 mg Oral QHS   lidocaine  1 patch Transdermal Daily   pantoprazole (PROTONIX) IV  40 mg Intravenous Q12H   polyethylene glycol  17 g Oral Daily   umeclidinium bromide  1 puff Inhalation Daily   valproic acid  250 mg Oral QHS    Continuous Infusions:  sodium chloride      PRN Meds: acetaminophen, albuterol, antiseptic oral rinse, diclofenac Sodium, glycopyrrolate **OR** glycopyrrolate **OR** glycopyrrolate, haloperidol **OR** haloperidol **OR** haloperidol lactate, hydrALAZINE, HYDROmorphone,  LORazepam **OR** LORazepam **OR** LORazepam, ondansetron **OR** ondansetron (ZOFRAN) IV, polyvinyl alcohol  Physical Exam Vitals and nursing note reviewed.  Constitutional:      General: She is not in acute distress.    Appearance: She is ill-appearing.  Pulmonary:     Effort: No respiratory distress.     Comments: Increased work of breathing Skin:    General: Skin is warm and dry.  Neurological:     Mental Status: She is unresponsive.     Motor: Weakness present.  Psychiatric:        Speech: She is noncommunicative.  Vital Signs: BP (!) 144/89    Pulse 63    Temp (!) 97.3 F (36.3 C) (Oral)    Resp 17    Ht 5\' 2"  (1.575 m)    Wt 42.2 kg    SpO2 99%    BMI 17.02 kg/m  SpO2: SpO2: 99 % O2 Device: O2 Device: Room Air O2 Flow Rate:    Intake/output summary:  Intake/Output Summary (Last 24 hours) at 11/17/2021 0948 Last data filed at 11/17/2021 0908 Gross per 24 hour  Intake 600 ml  Output 50 ml  Net 550 ml   LBM: Last BM Date: 11/16/21 Baseline Weight: Weight: 43 kg Most recent weight: Weight: 42.2 kg       Palliative Assessment/Data: PPS 10%      Patient Active Problem List   Diagnosis Date Noted   PNA (pneumonia) 11/02/2021   Pseudoaneurysm (HCC)    Pseudoaneurysm of femoral artery following procedure ([place]) 10/22/2021   Flank pain 25/63/8937   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, implant, and graft 09/29/2021   Bradycardia with 31-40 beats per minute 09/26/2021   Hypertensive crisis 09/10/2021   Rectal bleeding    Rectal pain    Malnutrition of moderate degree 06/17/2021   Weight loss 06/16/2021   Zoster 06/16/2021   Collapsed lung    COPD exacerbation ([place]) 09/26/2020   Cigarette smoker 09/26/2020   Shortness of breath 07/06/2020   Essential hypertension 07/06/2020   Abdominal pain 07/06/2020   Elevated INR 07/06/2020   Acute respiratory failure due to COVID-19 ([place]) 06/02/2020   Acute renal failure superimposed on chronic kidney  disease ([place]) 02/06/2020   Leukocytosis 02/06/2020   Thrombocytosis 02/06/2020   Palliative care encounter    Constipation 01/05/2020   Protein-calorie malnutrition, severe ([place]) 01/04/2020   Acute on chronic renal failure ([place]) 01/04/2020   Hyperkalemia 01/03/2020   AKI (acute kidney injury) ([place]) 01/03/2020   Acute respiratory failure with hypoxia ([place]) 11/01/2019   UTI (urinary tract infection) 11/01/2019   Community acquired pneumonia 11/01/2019   Acute cystitis without hematuria    Encephalopathy 09/18/2019   SAH (subarachnoid hemorrhage) ([place]) 09/18/2019   Dementia ([place]) 34/28/7681   Metabolic acidosis 15/72/6203   ARF (acute renal failure) ([place]) 09/17/2019   Acute encephalopathy 09/17/2019   Abnormal CT scan, colon    Diarrhea    Diverticulitis 06/07/2019   Depression with anxiety 06/07/2019   DNR (do not resuscitate) 03/23/2019   Volume overload 03/23/2019   Edema 03/23/2019   Acute renal failure (ARF) ([place]) 03/08/2019   Hypokalemia 03/08/2019   Palliative care by specialist    Goals of care, counseling/discussion    Altered mental status    Encephalopathy, toxic 12/24/2018   Aspiration pneumonia of both lower lobes due to gastric secretions (HCC) 12/24/2018   Non-ST elevation (NSTEMI) myocardial infarction [facility])    Chest pain    Dyspnea on exertion    Elevated troponin level 12/09/2018   Chronic migraine 03/27/2018   Lumbar radiculopathy 03/27/2018   Gait abnormality 03/27/2018   Hypertensive urgency 12/28/2016   CKD (chronic kidney disease) stage 4, GFR 15-29 ml/min (HCC) 12/28/2016   Anemia 12/28/2016   Chronic pain syndrome 12/28/2016   Benzodiazepine withdrawal without complication ([place]) 55/97/4163   Chronic right shoulder pain 12/06/2016   Fall 09/17/2016   Fracture of humeral head, closed, right, initial encounter 09/17/2016   Rib fractures 09/17/2016   Multiple falls 09/17/2016   Severe muscle deconditioning 09/17/2016   Failure to thrive in adult  09/17/2016  Melena 04/25/2016   Transient hypotension 04/25/2016   Anxiety state 04/25/2016   Tobacco abuse 04/25/2016   Hyperlipidemia 04/25/2016   Syncope 04/24/2016   Near syncope 04/24/2016   Gait difficulty 01/12/2016   Foot drop, left 01/12/2016   Severe recurrent major depression without psychotic features ([place]) 08/28/2015   Spondylolisthesis at L4-L5 level 07/30/2015   PVD (peripheral vascular disease) ([place]) 07/29/2014   Weakness-Bilateral arm/leg 07/29/2014   Numbness-left leg 07/29/2014   Swelling of limb-Legs 07/29/2014   Atherosclerosis of native arteries of the extremities with intermittent claudication 09/30/2011    Palliative Care Assessment & Plan   Patient Profile: 81 y.o. 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 Hemoglobin 5.4 and guaiac test positive in the ED. Admitted to [facility] with symptomatic anemia secondary to acute GI bleed.    Hospitalized 1/4 - 1/7 with probable pyelonephritis, right groin pseudoaneurysm, PAD, and CKD stage IV. She was seen by vascular surgery  and deemed not a candidate for surgical repair of the pseudoaneurysm. She had been previously deemed not a candidate for dialysis. She was discharged home with hospice ([facility]).  Hospitalized 1/16 - 1/26; presented with shortness of breath and found to have a COPD exacerbation. Discharged to SNF with palliative care ([facility]).   Assessment: Symptomatic anemia GI bleed Peripheral vascular disease Chronic pain syndrome Failure to thrive CKD stage IV Right groin pseudoaneurysm Terminal care  Recommendations/Plan: Continue full comfort measures Continue DNR/DNI as previously documented Patient's window for low risk transfer to residential hospice facility is likely closing soon - if she  is not able to transfer today she will likely become hospital death - [facility] liaison notified Requested RN administer PRN dose of 0.5mg  dilaudid for increased work of breathing and robinul for respiratory secretions Continue current comfort focused medication regimen, discontinued orders that were not focused on comfort PMT will continue to follow and support holistically  Goals of Care and Additional Recommendations: Limitations on Scope of Treatment: Full Comfort Care  Code Status:    Code Status Orders  (From admission, onward)           Start     Ordered   11/16/21 1414  Do not attempt resuscitation (DNR)  Continuous       Question Answer Comment  In the event of cardiac or respiratory ARREST Do not call a code blue   In the event of cardiac or respiratory ARREST Do not perform Intubation, CPR, defibrillation or ACLS   In the event of cardiac or respiratory ARREST Use medication by any route, position, wound care, and other measures to relive pain and suffering. May use oxygen, suction and manual treatment of airway obstruction as needed for comfort.      11/16/21 1416           Code Status History     Date Active Date Inactive Code Status Order ID Comments User Context   11/15/2021 1510 11/16/2021 1416 DNR [id number]  Lequita Halt, MD ED   11/02/2021 1500 11/12/2021 1819 DNR [id number]  Lequita Halt, MD ED   10/22/2021 1723 10/24/2021 1752 DNR [id number]  Karmen Bongo, MD ED   09/26/2021 2213 10/09/2021 2127 DNR [id number]  Deland Pretty, MD ED   09/10/2021 1733 09/17/2021 1956 DNR [id number]  Karmen Bongo, MD ED  06/20/2021 1517 07/10/2021 2212 DNR [id number]  Jimmy Footman, NP Inpatient   06/16/2021 0839 06/20/2021 1517 Full Code [id number]  Norval Morton, MD ED   09/29/2020 1916 10/03/2020 1943 Full Code [id number]  Lorella Nimrod, MD ED   07/06/2020 2134 07/10/2020 1837 Full Code [id number]  Etta Quill, DO ED   06/03/2020 1305 06/07/2020 2227  Full Code [id number]  [place], Silver Huguenin, MD ED   06/02/2020 1938 06/03/2020 1305 DNR [id number]  Elwyn Reach, MD ED   02/06/2020 1459 02/10/2020 2254 DNR [id number]  Norval Morton, MD ED   02/06/2020 1424 02/06/2020 1459 Full Code [id number]  Norval Morton, MD ED   01/02/2020 2259 01/09/2020 0016 Full Code [id number]  Oswald Hillock, MD Inpatient   11/01/2019 0353 11/04/2019 2028 DNR [id number]  Rise Patience, MD ED   09/17/2019 2155 09/22/2019 1934 DNR [id number]  Rise Patience, MD ED   09/17/2019 2146 09/17/2019 2155 DNR [id number]  Rise Patience, MD ED   06/07/2019 0900 06/16/2019 2012 DNR [id number]  Karmen Bongo, MD Inpatient   03/23/2019 0210 03/23/2019 1840 DNR [id number]  Roney Jaffe, MD Inpatient   03/08/2019 1819 03/12/2019 1741 Full Code [id number]  Donne Hazel, MD ED   12/31/2018 1400 01/09/2019 1450 DNR [id number]  Micheline Rough, MD Inpatient   12/24/2018 1641 12/31/2018 1400 Full Code [id number]  [place], [place], DO ED   12/10/2018 1837 12/13/2018 1920 DNR [id number]  Adrian Prows, MD Inpatient   12/09/2018 1647 12/10/2018 1836 Full Code [id number]  Truett Mainland, DO Inpatient   06/23/2017 1811 06/24/2017 1915 Full Code [id number]  Doreatha Lew, MD ED   06/14/2017 1209 06/14/2017 1824 Full Code [id number]  Nigel Mormon, MD Inpatient   12/28/2016 0016 12/29/2016 2137 Full Code [id number]  Edwin Dada, MD ED   09/17/2016 2114 09/20/2016 2340 Full Code [id number]  Roney Jaffe, MD Inpatient   04/24/2016 2321 04/26/2016 1845 Full Code [id number]  Norval Morton, MD ED   08/27/2015 2101 08/30/2015 1456 Full Code [id number]  Harvel Quale, MD ED   08/26/2015 1802 08/27/2015 0345 Full Code [id number]  Voncille Lo, MD ED   08/01/2015 1727 08/04/2015 1656 Full Code [id number]  Kary Kos, MD Inpatient   07/30/2015 1848 08/01/2015 1727 Full Code [id number]  Kary Kos, MD ED       Prognosis:  Hours - Days  Discharge Planning: [facility] pending bed availability vs  hospital death  Care plan was discussed with primary RN  Thank you for allowing the Palliative Medicine Team to assist in the care of this patient.      Greater than 50%  of this time was spent counseling and coordinating care related to the above assessment and plan.  Lin Landsman, NP  Please contact Palliative Medicine Team phone at [phone] for questions and concerns.

## 2021-11-17 NOTE — Progress Notes (Signed)
Attempted PIV start x3 with [place] but unsuccessful. No appropriate vein noted, RN made aware.

## 2021-11-17 NOTE — TOC Transition Note (Signed)
Transition of Care (TOC) - CM/SW Discharge Note Marvetta Gibbons RN, BSN Transitions of Care Unit 4E- RN Case Manager See Treatment Team for direct phone #  Cross coverage for [place]  Patient Details  Name: Alyssa [name] MRN: [id number] Date of Birth: 1940-12-30  Transition of Care [facility]) CM/SW Contact:  Dawayne Lula, RN Phone Number: 11/17/2021, 4:50 PM   Clinical Narrative:    Received notice from [facility] that pt has bed at [facility] for today and family agreeable to transition to [facility]- awaiting on family to complete paperwork at [facility] prior to arranging transport for pt to transition to [facility] for comfort care.   Paperwork for [facility] has been given to Financial controller to place on chart along with GOLD DNR.   Once family has completed paperwork- [facility] can be called to schedule transport- [phone]- (hours 8am-10pm)- if after 10pm then will need to call after hours [facility]- [phone]- [phone], then 3 to set up transport.   [facility] will notify TOC/Unit when registration paperwork has been completed to arrange transport.   RN please call report to [phone].   Final next level of care: [name]     Patient Goals and CMS Choice Patient states their goals for this hospitalization and ongoing recovery are:: comfort care      Discharge Placement               [facility]        Discharge Plan and Services In-house Referral: Clinical Social Work Discharge Planning Services: CM Consult Post Acute Care Choice: Hospice                               Social Determinants of Health (SDOH) Interventions     Readmission Risk Interventions Readmission Risk Prevention Plan 11/17/2021 11/06/2021 10/09/2021  Transportation Screening Complete Complete Complete  Medication Review Press photographer) Complete Complete Complete  PCP or Specialist appointment within 3-5 days of discharge Not Complete Complete  Complete  PCP/Specialist Appt Not Complete comments pt for [facility]- comfort care - -  [place] or [place] Complete Complete Complete  SW Recovery Care/Counseling Consult Complete Complete Complete  Palliative Care Screening Complete Complete Not [place] Not Applicable Complete Not Applicable  Some recent data might be hidden

## 2021-11-17 NOTE — Progress Notes (Signed)
Called report to [facility]. Spoke to [employer]. No questions at this time

## 2021-11-17 NOTE — Progress Notes (Signed)
Manufacturing engineer [facility]) Hospital Liaison note.      Received request from [place] for family interest in [facility]. Patient information has been forwarded to [facility] for review.     [place] will follow up tomorrow or sooner if a room becomes available and eligibility is confirmed.    A Please do not hesitate to call with questions.    Thank you,    Farrel Gordon, RN, [place] (listed on [facility] under [place])     [phone]

## 2021-11-17 NOTE — Discharge Summary (Signed)
Physician Discharge Summary   Patient: Alyssa [name] MRN: [id number] DOB: 05-07-1941  Admit date:     11/15/2021  Discharge date: 11/17/21  Discharge Physician: Edwin Dada   PCP: Pcp, No   Recommendations at discharge:  Discharge to residential hospice   Discharge Diagnoses: Principal discharge diagnosis:   Symptomatic anemia due to subacute GI bleeding Other discharge diagnoses:   Advanced dementia with failure to thrive   Hypertension   Peripheral vascular disease   Chronic pain syndrome   Chronic obstructive pulmonary disease   Severe protein calorie malnutrition   Chronic kidney disease stage IV      Hospital Course: Given the patient's overall poor functional status, recurrent admissions, worsening mentation, aggressive work up of her anemia and presumed GI blood loss was deferred.  She was transfused 2 units and had no observed melena, hematochezia and Hgb remained stable.  Palliative care was consulted and the patient was transitioned to comfort measures.  Residential hospice was recommended by [facility].  Given the short life expectancy, aspirin, Plavix, and statin have low utility and will be discontinued.            Patient is being discharged to residential hospice.  Consultants: Palliative Care   Disposition: Hospice care Diet recommendation: For comfort  DISCHARGE MEDICATION: Allergies as of 11/17/2021   No Known Allergies      Medication List     STOP taking these medications    amitriptyline 10 MG tablet Commonly known as: ELAVIL   amLODipine 10 MG tablet Commonly known as: NORVASC   aspirin 81 MG EC tablet   Breztri Aerosphere 160-9-4.8 MCG/ACT Aero Generic drug: Budeson-Glycopyrrol-Formoterol   carvedilol 3.125 MG tablet Commonly known as: COREG   CertaVite/Antioxidants Tabs   clopidogrel 75 MG tablet Commonly known as: PLAVIX   Ensure Plus Liqd   hydrALAZINE 50 MG tablet Commonly known as:  APRESOLINE   HYDROmorphone 2 MG tablet Commonly known as: DILAUDID Replaced by: HYDROmorphone 1 MG/ML injection   Iron High-Potency 325 MG Tabs   Lokelma 10 g Pack packet Generic drug: sodium zirconium cyclosilicate   magnesium oxide 400 MG tablet Commonly known as: MAG-OX   nitroGLYCERIN 0.4 MG SL tablet Commonly known as: NITROSTAT   polyethylene glycol 17 g packet Commonly known as: MiraLax   pregabalin 75 MG capsule Commonly known as: LYRICA   rosuvastatin 40 MG tablet Commonly known as: CRESTOR   sodium bicarbonate 650 MG tablet   valproic acid 250 MG capsule Commonly known as: DEPAKENE       TAKE these medications    acetaminophen 325 MG tablet Commonly known as: TYLENOL Take 650 mg by mouth every 6 (six) hours as needed for mild pain, fever or headache.   albuterol 108 (90 Base) MCG/ACT inhaler Commonly known as: VENTOLIN HFA Inhale 2 puffs into the lungs every 4 (four) hours as needed for wheezing or shortness of breath. What changed: Another medication with the same name was removed. Continue taking this medication, and follow the directions you see here.   antiseptic oral rinse Liqd Apply 15 mLs topically as needed for dry mouth.   diclofenac Sodium 1 % Gel Commonly known as: VOLTAREN Apply 2 g topically every 6 (six) hours as needed (knee pain).   dicyclomine 20 MG tablet Commonly known as: BENTYL Take 20 mg by mouth 3 (three) times daily.   dronabinol 2.5 MG capsule Commonly known as: MARINOL Take 1 capsule (2.5 mg total) by mouth 2 (two) times daily before  lunch and supper.   glycopyrrolate 1 MG tablet Commonly known as: ROBINUL Take 1 tablet (1 mg total) by mouth every 4 (four) hours as needed (excessive secretions).   haloperidol 0.5 MG tablet Commonly known as: HALDOL Take 1 tablet (0.5 mg total) by mouth every 4 (four) hours as needed for agitation (or delirium).   HYDROmorphone 1 MG/ML injection Commonly known as: DILAUDID Inject  0.5 mLs (0.5 mg total) into the vein every 6 (six) hours. Replaces: HYDROmorphone 2 MG tablet   HYDROmorphone 1 MG/ML injection Commonly known as: DILAUDID Inject 0.5-1 mLs (0.5-1 mg total) into the vein every 2 (two) hours as needed for moderate pain or severe pain (distress, increased work of breathing, RR >25, dyspnea).   hydrOXYzine 25 MG tablet Commonly known as: ATARAX Take 25 mg by mouth at bedtime.   Lidocaine 4 % Ptch Apply 1 patch topically daily. Right shoulder   LORazepam 1 MG tablet Commonly known as: ATIVAN Take 1 tablet (1 mg total) by mouth every 4 (four) hours as needed for anxiety. What changed:  medication strength how much to take when to take this reasons to take this   ondansetron 4 MG disintegrating tablet Commonly known as: ZOFRAN-ODT Take 1 tablet (4 mg total) by mouth every 6 (six) hours as needed for nausea.   polyvinyl alcohol 1.4 % ophthalmic solution Commonly known as: LIQUIFILM TEARS Place 1 drop into both eyes 4 (four) times daily as needed for dry eyes.         Discharge Exam: Filed Weights   11/15/21 1229 11/15/21 1827 11/16/21 0524  Weight: 43 kg 41.2 kg 42.2 kg   Frail elderly female, central alopecia, mild left-sided weakness, confused. Tongue is dry, she is partially edentulous.  She has a systolic murmur.  Heart rate is normal.  She has no peripheral edema.    Condition at discharge: worsening  The results of significant diagnostics from this hospitalization (including imaging, microbiology, ancillary and laboratory) are listed below for reference.   Imaging Studies: CT Head Wo Contrast  Result Date: 11/15/2021 CLINICAL DATA:  BIB EMS for code sepsis from [facility]. At baseline pt is A&Ox4 and can transfer from bed to w/c. Last night pt became increasingly lethargic and not acting her norm, staff called EMS today for fever, minimal verbal response and acute change from baseline EXAM: CT HEAD WITHOUT CONTRAST TECHNIQUE:  Contiguous axial images were obtained from the base of the skull through the vertex without intravenous contrast. RADIATION DOSE REDUCTION: This exam was performed according to the departmental dose-optimization program which includes automated exposure control, adjustment of the mA and/or kV according to patient size and/or use of iterative reconstruction technique. COMPARISON:  08/07/2021. FINDINGS: Brain: No evidence of acute infarction, hemorrhage, hydrocephalus, extra-axial collection or mass lesion/mass effect. There is ventricular sulcal enlargement reflecting mild diffuse atrophy. Patchy white matter hypoattenuation is noted bilaterally consistent with moderate chronic microvascular ischemic change. These findings are stable. Vascular: No hyperdense vessel or unexpected calcification. Skull: Normal. Negative for fracture or focal lesion. Sinuses/Orbits: Visualized globes and orbits are unremarkable. There are findings of previous sinus surgery on the right with ethmoidectomies and a medial antrectomy. Dependent fluid noted in the right sphenoid sinus. Remaining sinuses are clear. Other: None. IMPRESSION: 1. No acute intracranial abnormalities. 2. Mild atrophy and moderate chronic microvascular ischemic change. 3. Fluid level in the right sphenoid sinus. Consider acute sinusitis in the proper clinical setting. Electronically Signed   By: Dedra Skeens.D.  On: 11/15/2021 14:43   DG Chest Port 1 View  Result Date: 11/15/2021 CLINICAL DATA:  Sepsis. EXAM: PORTABLE CHEST 1 VIEW COMPARISON:  November 02, 2021. FINDINGS: Stable cardiomediastinal silhouette. Both lungs are clear. The visualized skeletal structures are unremarkable. IMPRESSION: No active disease. Electronically Signed   By: Marijo Conception M.D.   On: 11/15/2021 13:42   DG Chest Port 1 View  Result Date: 11/02/2021 CLINICAL DATA:  Questionable sepsis, shortness of breath. EXAM: PORTABLE CHEST 1 VIEW COMPARISON:  September 26, 2021 FINDINGS:  The heart size and mediastinal contours are stable. Mild atelectasis of left lung base is identified. Both lungs are otherwise clear. The visualized skeletal structures are unremarkable. IMPRESSION: Mild atelectasis of left lung base. Electronically Signed   By: Abelardo Diesel M.D.   On: 11/02/2021 12:06   VAS [place] ABI WITH/WO TBI  Result Date: 10/22/2021  LOWER EXTREMITY DOPPLER STUDY Patient Name:  MYRIKAL MESSMER  Date of Exam:   10/22/2021 Medical Rec #: [id number]             Accession #:    [id number] Date of Birth: [birth year]            Patient Gender: F Patient Age:   76 years Exam Location:  [facility] Procedure:      VAS [place] ABI WITH/WO TBI Referring Phys: Jamelle Haring --------------------------------------------------------------------------------  Indications: Peripheral artery disease. High Risk Factors: Hypertension, hyperlipidemia.  Vascular               Femoral-femoral bypass grafting in 2011, lost to follow Interventions:         up. Comparison Study: Prior ABI done 09/30/11 Performing Technologist: Sharion Dove RVS  Examination Guidelines: A complete evaluation includes at minimum, Doppler waveform signals and systolic blood pressure reading at the level of bilateral brachial, anterior tibial, and posterior tibial arteries, when vessel segments are accessible. Bilateral testing is considered an integral part of a complete examination. Photoelectric Plethysmograph (PPG) waveforms and toe systolic pressure readings are included as required and additional duplex testing as needed. Limited examinations for reoccurring indications may be performed as noted.  ABI Findings: +---------+------------------+-----+-----------+--------+  Right     Rt Pressure (mmHg) Index Waveform    Comment   +---------+------------------+-----+-----------+--------+  Brachial  198                      multiphasic           +---------+------------------+-----+-----------+--------+  PTA       97                  0.49  monophasic            +---------+------------------+-----+-----------+--------+  DP        95                 0.48  monophasic            +---------+------------------+-----+-----------+--------+  Great Toe 79                 0.40                        +---------+------------------+-----+-----------+--------+ +---------+------------------+-----+-----------+-------+  Left      Lt Pressure (mmHg) Index Waveform    Comment  +---------+------------------+-----+-----------+-------+  Brachial  193                      multiphasic          +---------+------------------+-----+-----------+-------+  PTA       85                 0.43  monophasic           +---------+------------------+-----+-----------+-------+  DP        117                0.59  monophasic           +---------+------------------+-----+-----------+-------+  Great Toe 99                 0.50                       +---------+------------------+-----+-----------+-------+ +-------+-----------+-----------+------------+------------+  ABI/TBI Today's ABI Today's TBI Previous ABI Previous TBI  +-------+-----------+-----------+------------+------------+  Right   0.49        0.40        0.94                       +-------+-----------+-----------+------------+------------+  Left    0.59        0.50        0.94                       +-------+-----------+-----------+------------+------------+ Bilateral ABIs appear decreased compared to prior study on 09/30/11.  Summary: Right: Resting right ankle-brachial index indicates severe right lower extremity arterial disease. The right toe-brachial index is abnormal. Left: Resting left ankle-brachial index indicates moderate left lower extremity arterial disease. The left toe-brachial index is abnormal.  *See table(s) above for measurements and observations.  Electronically signed by Jamelle Haring on 10/22/2021 at 4:59:29 PM.    Final    CT Renal Stone Study  Result Date: 10/21/2021 CLINICAL DATA:  Flank pain.  Concern for  kidney stone. EXAM: CT ABDOMEN AND PELVIS WITHOUT CONTRAST TECHNIQUE: Multidetector CT imaging of the abdomen and pelvis was performed following the standard protocol without IV contrast. COMPARISON:  CT of the abdomen pelvis dated 09/15/2021. FINDINGS: Evaluation of this exam is limited in the absence of intravenous contrast. Evaluation is also limited due to streak artifact caused by right hip arthroplasty and spinal hardware as well as respiratory motion. Lower chest: Minimal bibasilar atelectasis. There is coronary vascular calcification. No intra-abdominal free air or free fluid. Hepatobiliary: No focal liver abnormality is seen. No gallstones, gallbladder wall thickening, or biliary dilatation. Pancreas: The pancreas is moderately atrophic. No active inflammatory changes. Spleen: Normal in size without focal abnormality. Adrenals/Urinary Tract: The adrenal glands are grossly unremarkable. Mild right renal parenchyma atrophy. A 6 mm calcific focus over the left renal hilum, likely vascular calcification. No hydronephrosis or nephrolithiasis. Small bilateral renal hypodense lesions are not characterized on this CT. The visualized ureters and urinary bladder appear unremarkable. Stomach/Bowel: There is severe sigmoid diverticulosis. There is a small hiatal hernia. No bowel obstruction. The appendix is normal. Vascular/Lymphatic: Advanced aortoiliac atherosclerotic disease. The IVC is grossly unremarkable. No portal venous gas. There is no adenopathy. Reproductive: The uterus is poorly visualized. Probable small calcified uterine fibroid. 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 Musculoskeletal: Right hip arthroplasty. Osteopenia with degenerative changes of the spine. Lower lumbar fixation hardware. No acute osseous pathology. IMPRESSION: 1. No acute intra-abdominal or pelvic pathology. No hydronephrosis or nephrolithiasis.  2. Severe sigmoid diverticulosis. No bowel obstruction. Normal appendix. 3. Aortic Atherosclerosis (ICD10-I70.0). Electronically Signed  By: Anner Crete M.D.   On: 10/21/2021 21:57   VAS [place] LOWER EXTREMITY ARTERIAL DUPLEX  Result Date: 10/22/2021 LOWER EXTREMITY ARTERIAL DUPLEX STUDY Patient Name:  ETTA GASSETT  Date of Exam:   10/22/2021 Medical Rec #: [id number]             Accession #:    [id number] Date of Birth: October 06, 1941            Patient Gender: F Patient Age:   19 years Exam Location:  [facility] Procedure:      VAS [place] LOWER EXTREMITY ARTERIAL DUPLEX Referring Phys: Jamelle Haring --------------------------------------------------------------------------------  Indications: Peripheral artery disease, and Known active pseudoaneurysm. High Risk Factors: Hypertension, hyperlipidemia.  Vascular               Femoral-femoral bypass grafting in 2011, lost to follow Interventions:         up. 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 Bilateral testing is considered an integral part of a complete examination. Limited examinations for reoccurring indications may be performed as noted.  +----------+--------+-----+--------+----------+--------------------------+  RIGHT      PSV cm/s Ratio Stenosis Waveform   Comments                    +----------+--------+-----+--------+----------+--------------------------+  CFA Prox   96                      monophasic                             +----------+--------+-----+--------+----------+--------------------------+  DFA        26                      monophasic multiple collaterals noted   +----------+--------+-----+--------+----------+--------------------------+  SFA Prox   67                      monophasic                             +----------+--------+-----+--------+----------+--------------------------+  SFA Mid    69                      monophasic                             +----------+--------+-----+--------+----------+--------------------------+  SFA Distal 107                     monophasic                             +----------+--------+-----+--------+----------+--------------------------+  POP Prox   45                      monophasic                             +----------+--------+-----+--------+----------+--------------------------+  POP Mid    49                      monophasic                             +----------+--------+-----+--------+----------+--------------------------+  PTA Prox   46                      monophasic                             +----------+--------+-----+--------+----------+--------------------------+  PTA Mid    20                      monophasic                             +----------+--------+-----+--------+----------+--------------------------+ 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   Summary: Right: Diffuse atherosclerotic plaque noted throughout. 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  See table(s) above for measurements and observations. Electronically signed by Jamelle Haring on 10/22/2021 at 5:00:29 PM.    Final     Microbiology: Results for orders placed or performed during the hospital encounter of 11/15/21  Resp Panel by RT-PCR (Flu A&B, Covid)     Status: None   Collection Time: 11/15/21 12:37 PM   Specimen: Nasopharyngeal(NP) swabs in vial transport medium  Result Value Ref Range Status    SARS Coronavirus 2 by RT PCR NEGATIVE NEGATIVE Final    Comment: (NOTE) SARS-CoV-2 target nucleic acids are NOT DETECTED.  The SARS-CoV-2 RNA is generally detectable in upper respiratory specimens during the acute phase of infection. The lowest concentration of SARS-CoV-2 viral copies this assay can detect is 138 copies/mL. A negative result does not preclude SARS-Cov-2 infection and should not be used as the sole basis for treatment or other patient management decisions. A negative result may occur with  improper specimen collection/handling, submission of specimen other than nasopharyngeal swab, presence of viral mutation(s) within the areas targeted by this assay, and inadequate number of viral copies(<138 copies/mL). A negative result must be combined with clinical observations, patient history, and epidemiological information. The expected result is Negative.  Fact Sheet for Patients:  EntrepreneurPulse.com.au  Fact Sheet for Healthcare Providers:  IncredibleEmployment.be  This test is no t yet approved or cleared by the [place] FDA and  has been authorized for detection and/or diagnosis of SARS-CoV-2 by FDA under an Emergency Use Authorization (EUA). This EUA will remain  in effect (meaning this test can be used) for the duration of the COVID-19 declaration under Section 564(b)(1) of the Act, 21 U.S.C.section 360bbb-3(b)(1), unless the authorization is terminated  or revoked sooner.       Influenza A by PCR NEGATIVE NEGATIVE Final   Influenza B by PCR NEGATIVE NEGATIVE Final    Comment: (NOTE) The Xpert Xpress SARS-CoV-2/FLU/RSV plus assay is intended as an aid in the diagnosis of influenza from Nasopharyngeal swab specimens and should not be used as a sole basis for treatment. Nasal washings and aspirates are unacceptable for Xpert Xpress SARS-CoV-2/FLU/RSV testing.  Fact Sheet for  Patients: EntrepreneurPulse.com.au  Fact Sheet for Healthcare Providers:  IncredibleEmployment.be  This test is not yet approved or cleared by the [place] and has been authorized for detection and/or diagnosis of SARS-CoV-2 by FDA under an Emergency Use Authorization (EUA). This EUA will remain in effect (meaning this test can be used) for the duration of the COVID-19 declaration under Section 564(b)(1) of the Act, 21 U.S.C. section 360bbb-3(b)(1), unless the authorization is terminated or revoked.  Performed at [facility], [place] [address], [place], [place] [address]   Blood Culture (routine x 2)     Status: None (Preliminary result)   Collection Time: 11/15/21  1:00 PM   Specimen: BLOOD RIGHT HAND  Result Value Ref Range Status   Specimen Description BLOOD RIGHT HAND  Final   Special Requests   Final    BOTTLES DRAWN AEROBIC ONLY Blood Culture results may not be optimal due to an inadequate volume of blood received in culture bottles   Culture   Final    NO GROWTH 2 DAYS Performed at [facility], [place] [address] [place], [place] [address]    Report Status PENDING  Incomplete  Urine Culture     Status: Abnormal   Collection Time: 11/15/21  6:41 PM   Specimen: Urine, Clean Catch  Result Value Ref Range Status   Specimen Description URINE, CLEAN CATCH  Final   Special Requests   Final    NONE Performed at [facility], [place] [address] [place], [place] [address]    Culture MULTIPLE SPECIES PRESENT, SUGGEST RECOLLECTION (A)  Final   Report Status 11/16/2021 FINAL  Final  Blood Culture (routine x 2)     Status: None (Preliminary result)   Collection Time: 11/15/21  7:56 PM   Specimen: BLOOD  Result Value Ref Range Status   Specimen Description BLOOD RIGHT ANTECUBITAL  Final   Special Requests   Final    BOTTLES DRAWN AEROBIC ONLY Blood Culture results may not be optimal due to an inadequate volume of blood  received in culture bottles   Culture   Final    NO GROWTH 2 DAYS Performed at [facility], [place] [address], [place], [place] [address]    Report Status PENDING  Incomplete    Labs: CBC: Recent Labs  Lab 11/15/21 1255 11/15/21 1956 11/16/21 0505  WBC 11.5*  --  10.2  NEUTROABS 7.3  --   --   HGB 5.4* 8.2* 10.8*  HCT 17.8* 26.6* 30.5*  MCV 91.3  --  87.9  PLT 474*  --  818   Basic Metabolic Panel: Recent Labs  Lab 11/15/21 1255 11/16/21 0505  NA 144 142  K 4.2 4.1  CL 107 106  CO2 28 24  GLUCOSE 109* 74  BUN 48* 40*  CREATININE 3.25* 2.79*  CALCIUM 8.8* 8.5*   Liver Function Tests: Recent Labs  Lab 11/15/21 1255  AST 47*  ALT 54*  ALKPHOS 100  BILITOT 0.4  PROT 6.2*  ALBUMIN 3.2*   CBG: No results for input(s): GLUCAP in the last 168 hours.  Discharge time spent: 40 minutes.  Signed: Edwin Dada, MD [facility] 11/17/2021

## 2021-11-17 NOTE — Progress Notes (Addendum)
Manufacturing engineer [facility]) Hospital Liaison note.     This patient is approved to transfer to [facility] today.    Please maintain IV access for transfer to [facility].   [facility] will notify TOC when registration paperwork has been completed to arrange transport.   RN please call report to [phone].   Thank you,     Farrel Gordon, RN, [place] [facility] Liaison  [phone]

## 2021-11-17 NOTE — Progress Notes (Signed)
Manufacturing engineer [facility]) Hospital Liaison note.      Consents are complete. Please arrange transport to [facility].    Please maintain IV access for transfer to [facility].    RN please call report to [phone].    Thank you,     Farrel Gordon, RN, [place] Hospital Liaison  [phone]

## 2021-11-20 LAB — CULTURE, BLOOD (ROUTINE X 2)
Culture: NO GROWTH
Culture: NO GROWTH

## 2021-11-25 ENCOUNTER — Institutional Professional Consult (permissible substitution): Payer: Medicare HMO | Admitting: Pulmonary Disease

## 2021-11-25 NOTE — Progress Notes (Deleted)
Subjective:   PATIENT ID: Alyssa [name] GENDER: female DOB: [birth year], MRN: [id number]   HPI  No chief complaint on file.   Reason for Visit: ***Follow-up ***New consult for ***  Ms. Alyssa [name] is a 81 year old female with dementia, COPD, hypertension, PVD stage IV CKD  She was recently discharged for hospitalization from 11/15/2021-11/17/2021  *** Social History:  Environmental exposures: ***  I have personally reviewed patient's past medical/family/social history, allergies, current medications.***  Past Medical History:  Diagnosis Date   Anxiety    Arthritis    Chronic kidney disease    Headache(784.0)    Hyperlipidemia    Hypertension    Peripheral neuropathy    Peripheral vascular disease ([place])    Syncope 09/2020     Family History  Problem Relation Age of Onset   Heart attack Mother    Heart disease Mother    Hyperlipidemia Mother    Hypertension Mother    Heart attack Father    Heart disease Father        Before age 40   Hyperlipidemia Father    Hypertension Father    Cancer Sister        brain tumor   Aneurysm Brother        brain   Colon cancer Neg Hx    Liver cancer Neg Hx      Social History   Occupational History   Occupation: Retired  Tobacco Use   Smoking status: Former    Packs/day: 1.00    Years: 50.00    Pack years: 50.00    Types: Cigarettes    Quit date: 04/22/2021    Years since quitting: 0.5   Smokeless tobacco: Never   Tobacco comments:    Quit 3 months ago 07/23/21 [facility]  Vaping Use   Vaping Use: Never used  Substance and Sexual Activity   Alcohol use: No   Drug use: No   Sexual activity: Not Currently    No Known Allergies   Outpatient Medications Prior to Visit  Medication Sig Dispense Refill   acetaminophen (TYLENOL) 325 MG tablet Take 650 mg by mouth every 6 (six) hours as needed for mild pain, fever or headache.     albuterol (VENTOLIN HFA) 108 (90 Base) MCG/ACT inhaler Inhale 2 puffs into the  lungs every 4 (four) hours as needed for wheezing or shortness of breath.     antiseptic oral rinse (BIOTENE) LIQD Apply 15 mLs topically as needed for dry mouth.     diclofenac Sodium (VOLTAREN) 1 % GEL Apply 2 g topically every 6 (six) hours as needed (knee pain).     dicyclomine (BENTYL) 20 MG tablet Take 20 mg by mouth 3 (three) times daily.     dronabinol (MARINOL) 2.5 MG capsule Take 1 capsule (2.5 mg total) by mouth 2 (two) times daily before lunch and supper. 60 capsule 0   glycopyrrolate (ROBINUL) 1 MG tablet Take 1 tablet (1 mg total) by mouth every 4 (four) hours as needed (excessive secretions).     haloperidol (HALDOL) 0.5 MG tablet Take 1 tablet (0.5 mg total) by mouth every 4 (four) hours as needed for agitation (or delirium).     HYDROmorphone (DILAUDID) 1 MG/ML injection Inject 0.5 mLs (0.5 mg total) into the vein every 6 (six) hours. 1 mL 0   HYDROmorphone (DILAUDID) 1 MG/ML injection Inject 0.5-1 mLs (0.5-1 mg total) into the vein every 2 (two) hours as needed for moderate pain or severe pain (distress,  increased work of breathing, RR >25, dyspnea). 1 mL 0   hydrOXYzine (ATARAX/VISTARIL) 25 MG tablet Take 25 mg by mouth at bedtime.     Lidocaine 4 % PTCH Apply 1 patch topically daily. Right shoulder     LORazepam (ATIVAN) 1 MG tablet Take 1 tablet (1 mg total) by mouth every 4 (four) hours as needed for anxiety. 30 tablet 0   ondansetron (ZOFRAN-ODT) 4 MG disintegrating tablet Take 1 tablet (4 mg total) by mouth every 6 (six) hours as needed for nausea. 20 tablet 0   polyvinyl alcohol (LIQUIFILM TEARS) 1.4 % ophthalmic solution Place 1 drop into both eyes 4 (four) times daily as needed for dry eyes. 15 mL 0   No facility-administered medications prior to visit.    ROS   Objective:  There were no vitals filed for this visit.    Physical Exam: General: Well-appearing, no acute distress HENT: [place], AT Eyes: EOMI, no scleral icterus Respiratory: Clear to auscultation  bilaterally.  No crackles, wheezing or rales Cardiovascular: RRR, -M/R/G, no JVD Extremities:-Edema,-tenderness Neuro: AAO x4, CNII-XII grossly intact Psych: Normal mood, normal affect  Data Reviewed:  Imaging: CT chest 07/22/2021-centrilobular emphysema.  Right lower lobe nodule measuring 5 mm unchanged since 01/02/2020.  Minimal basilar scarring/atelectasis CXR 11/15/2021-no acute infiltrate, effusion or edema  PFT: 01/02/2021 FVC 1.9 (122%) FEV1 1.34 (110%) ratio 70 (LLN 65) TLC 121% RV 161% RV/TLC 137% DLCO 48% Interpretation: Mild obstructive defect with no significant bronchodilator response.  Air trapping and reduced DLCO in setting of obstruction suggestive of emphysema.  Labs: CBC    Component Value Date/Time   WBC 10.2 11/16/2021 0505   RBC 3.47 (L) 11/16/2021 0505   HGB 10.8 (L) 11/16/2021 0505   HCT 30.5 (L) 11/16/2021 0505   PLT 355 11/16/2021 0505   MCV 87.9 11/16/2021 0505   MCH 31.1 11/16/2021 0505   MCHC 35.4 11/16/2021 0505   RDW 16.2 (H) 11/16/2021 0505   LYMPHSABS 2.4 11/15/2021 1255   MONOABS 1.5 (H) 11/15/2021 1255   EOSABS 0.1 11/15/2021 1255   BASOSABS 0.1 11/15/2021 1255   Absolute eos 11/15/2021-100     Assessment & Plan:   Discussion: ***  Health Maintenance Immunization History  Administered Date(s) Administered   PFIZER(Purple Top)SARS-COV-2 Vaccination 12/11/2019, 01/01/2020, 10/12/2020   Pneumococcal Polysaccharide-23 09/19/2016   Tdap 02/04/2015   CT Lung Screen***  No orders of the defined types were placed in this encounter. No orders of the defined types were placed in this encounter.   No follow-ups on file.  I have spent a total time of***-minutes on the day of the appointment reviewing prior documentation, coordinating care and discussing medical diagnosis and plan with the patient/family. Imaging, labs and tests included in this note have been reviewed and interpreted independently by me.  [place], MD [facility] 11/25/2021 10:52 AM  Office Number [phone]

## 2021-12-16 DEATH — deceased

## 2021-12-23 ENCOUNTER — Ambulatory Visit: Payer: Medicare HMO | Admitting: Cardiovascular Disease

## 2022-01-13 IMAGING — DX DG CHEST 2V
2 series · 2 of 2 positions shown · non-contrast
Comparison: July 06, 2020

CLINICAL DATA: COPD

EXAM:
CHEST - 2 VIEW

[chest pa]
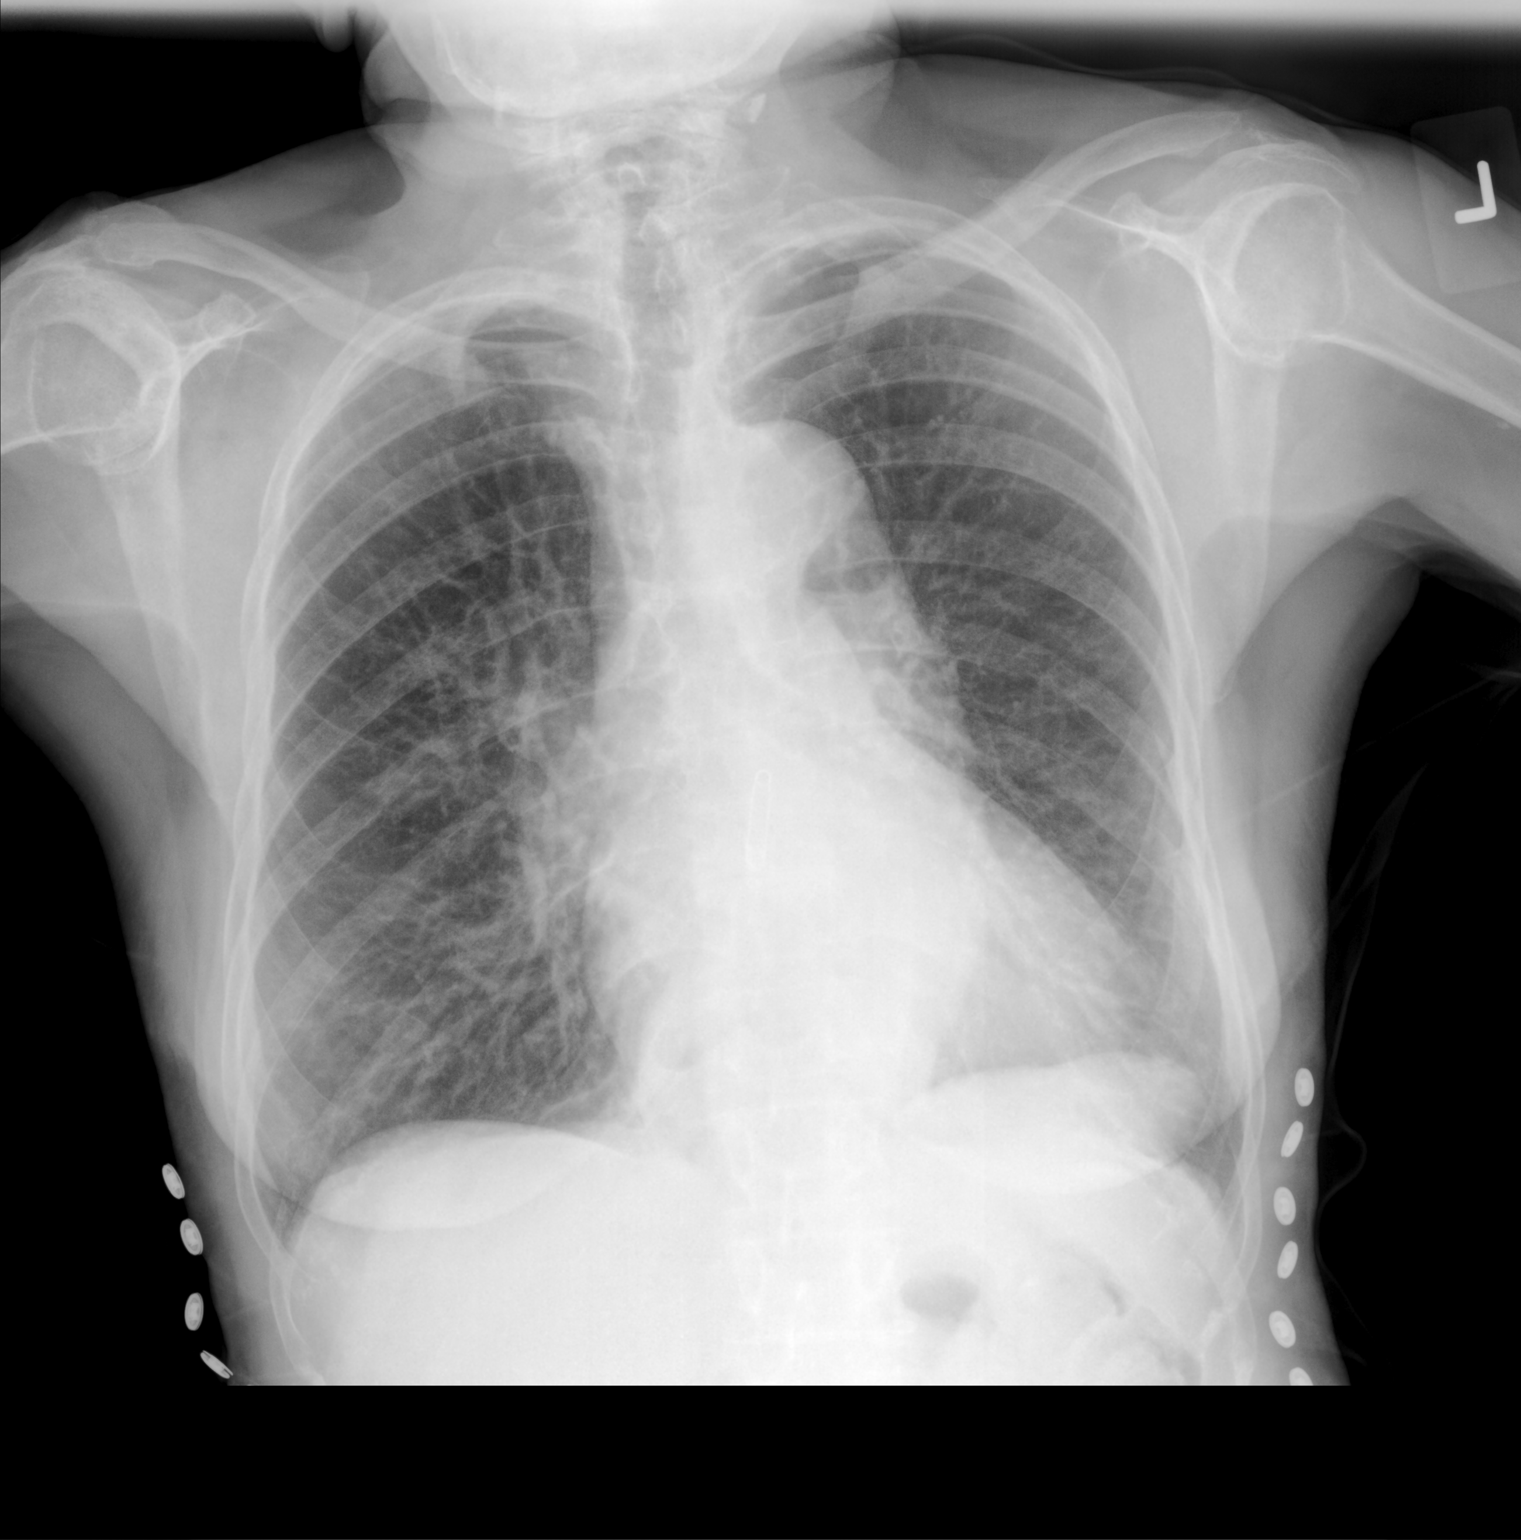

[chest lat]
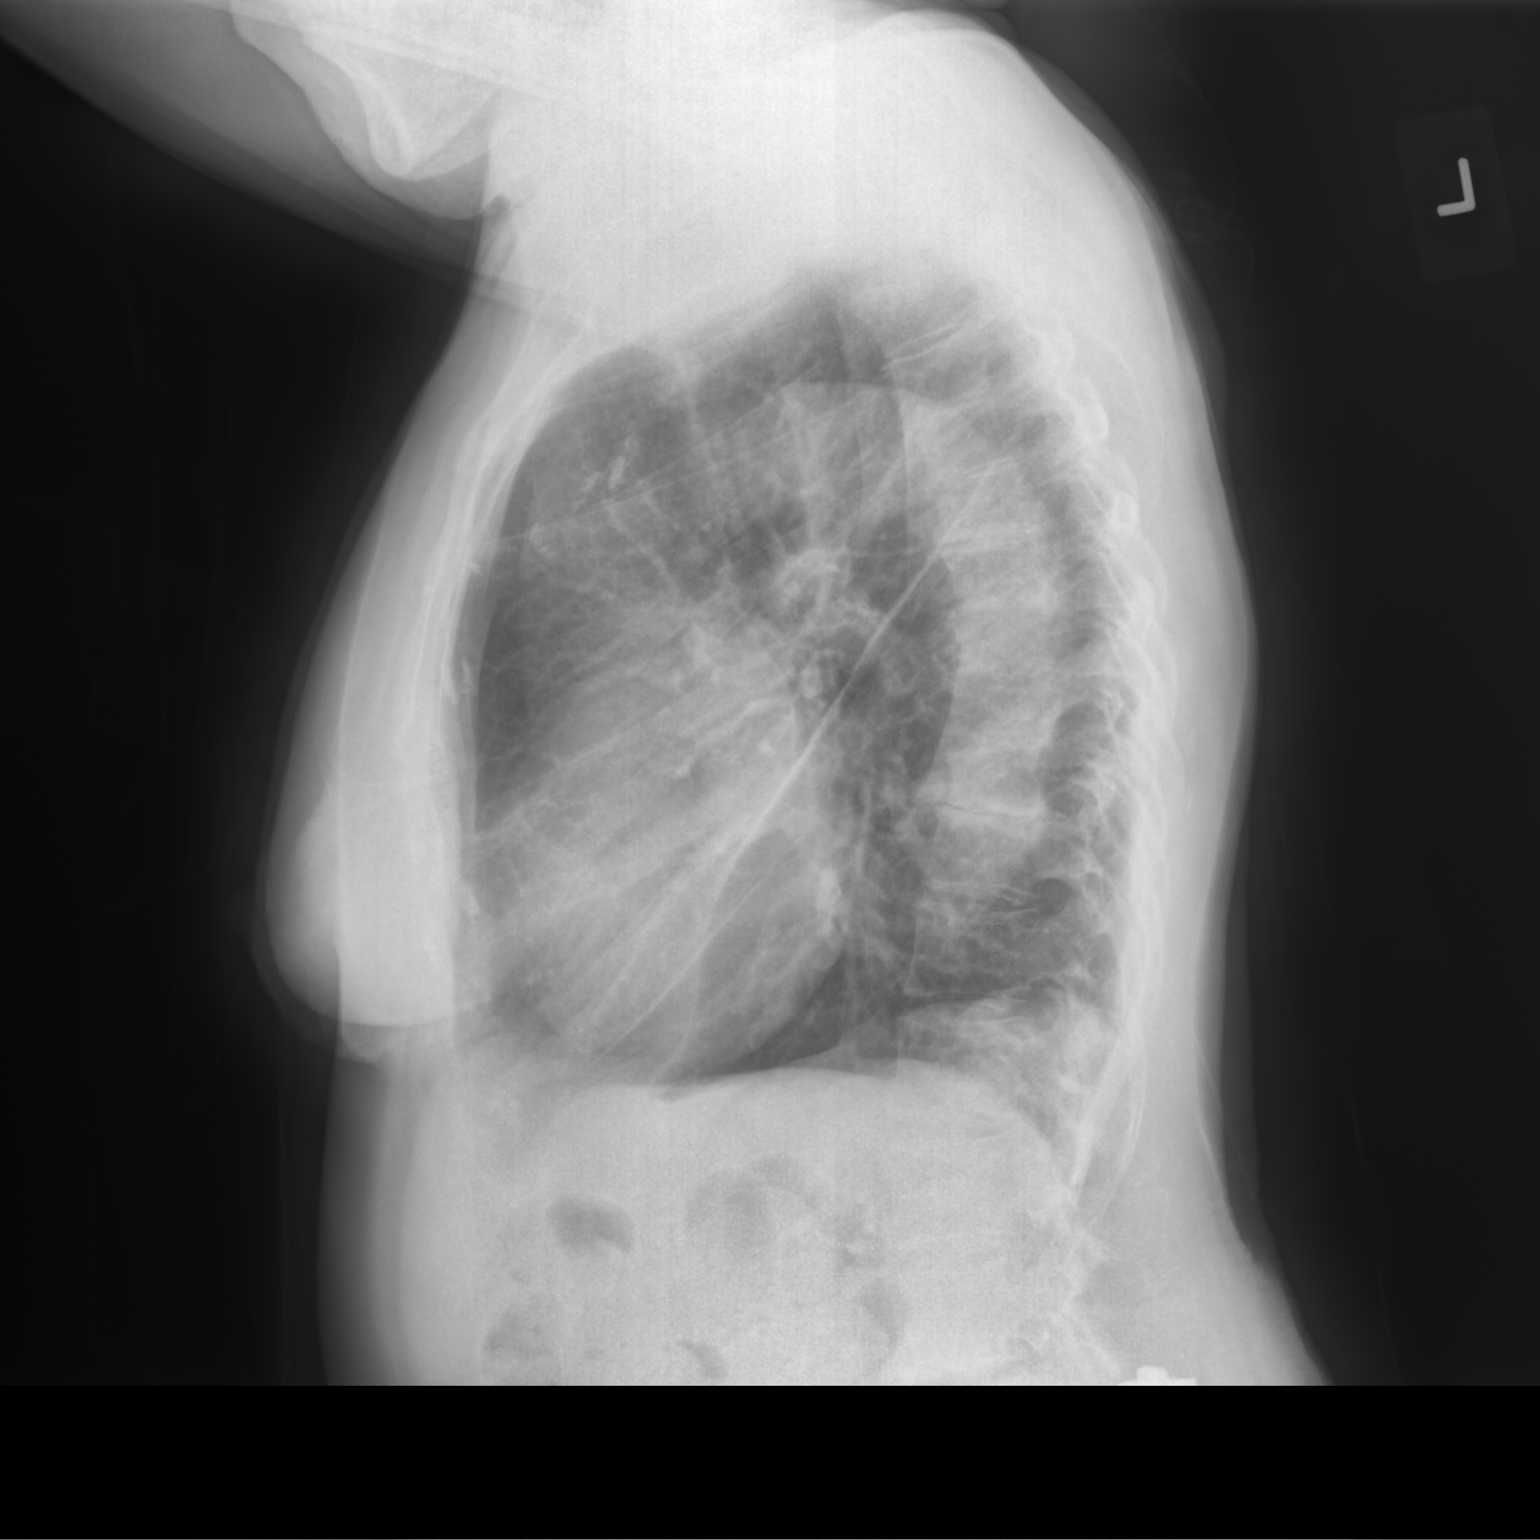

[2 of 2 positions shown; findings below may reference images not displayed]

FINDINGS: The heart size borderline. There is a tortuous thoracic aorta. The
hila and mediastinum are unchanged. No pneumothorax. No nodules or
masses. No focal infiltrates.
IMPRESSION: No active cardiopulmonary disease.

## 2022-07-24 IMAGING — DX DG CHEST 2V
2 series · 2 of 2 positions shown · non-contrast
Comparison: Radiographs 12/05/2020 and 07/06/2020.  CT 01/02/2020.

CLINICAL DATA: Chest pain with difficulty breathing and abdominal
pain. Subjective fever.

EXAM:
CHEST - 2 VIEW

[w chest lat]
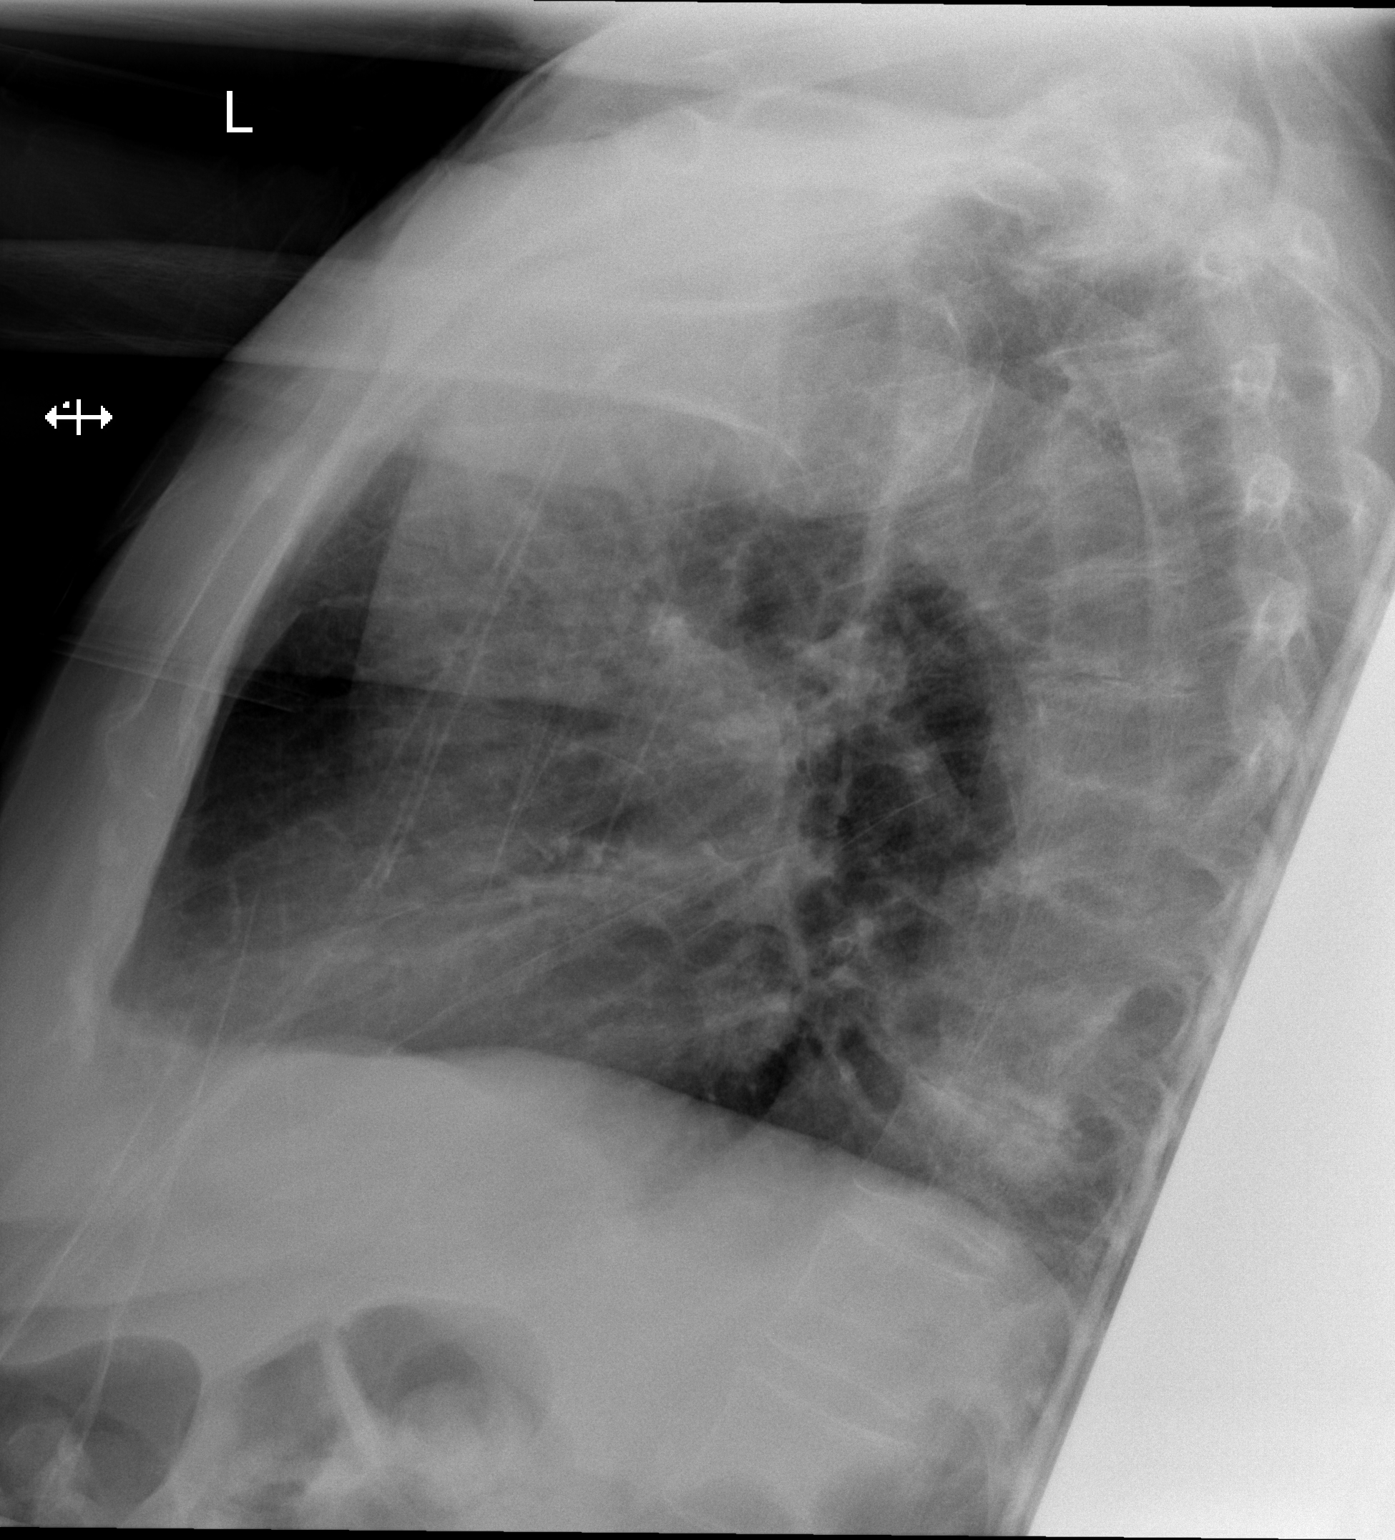

[x chest ap]
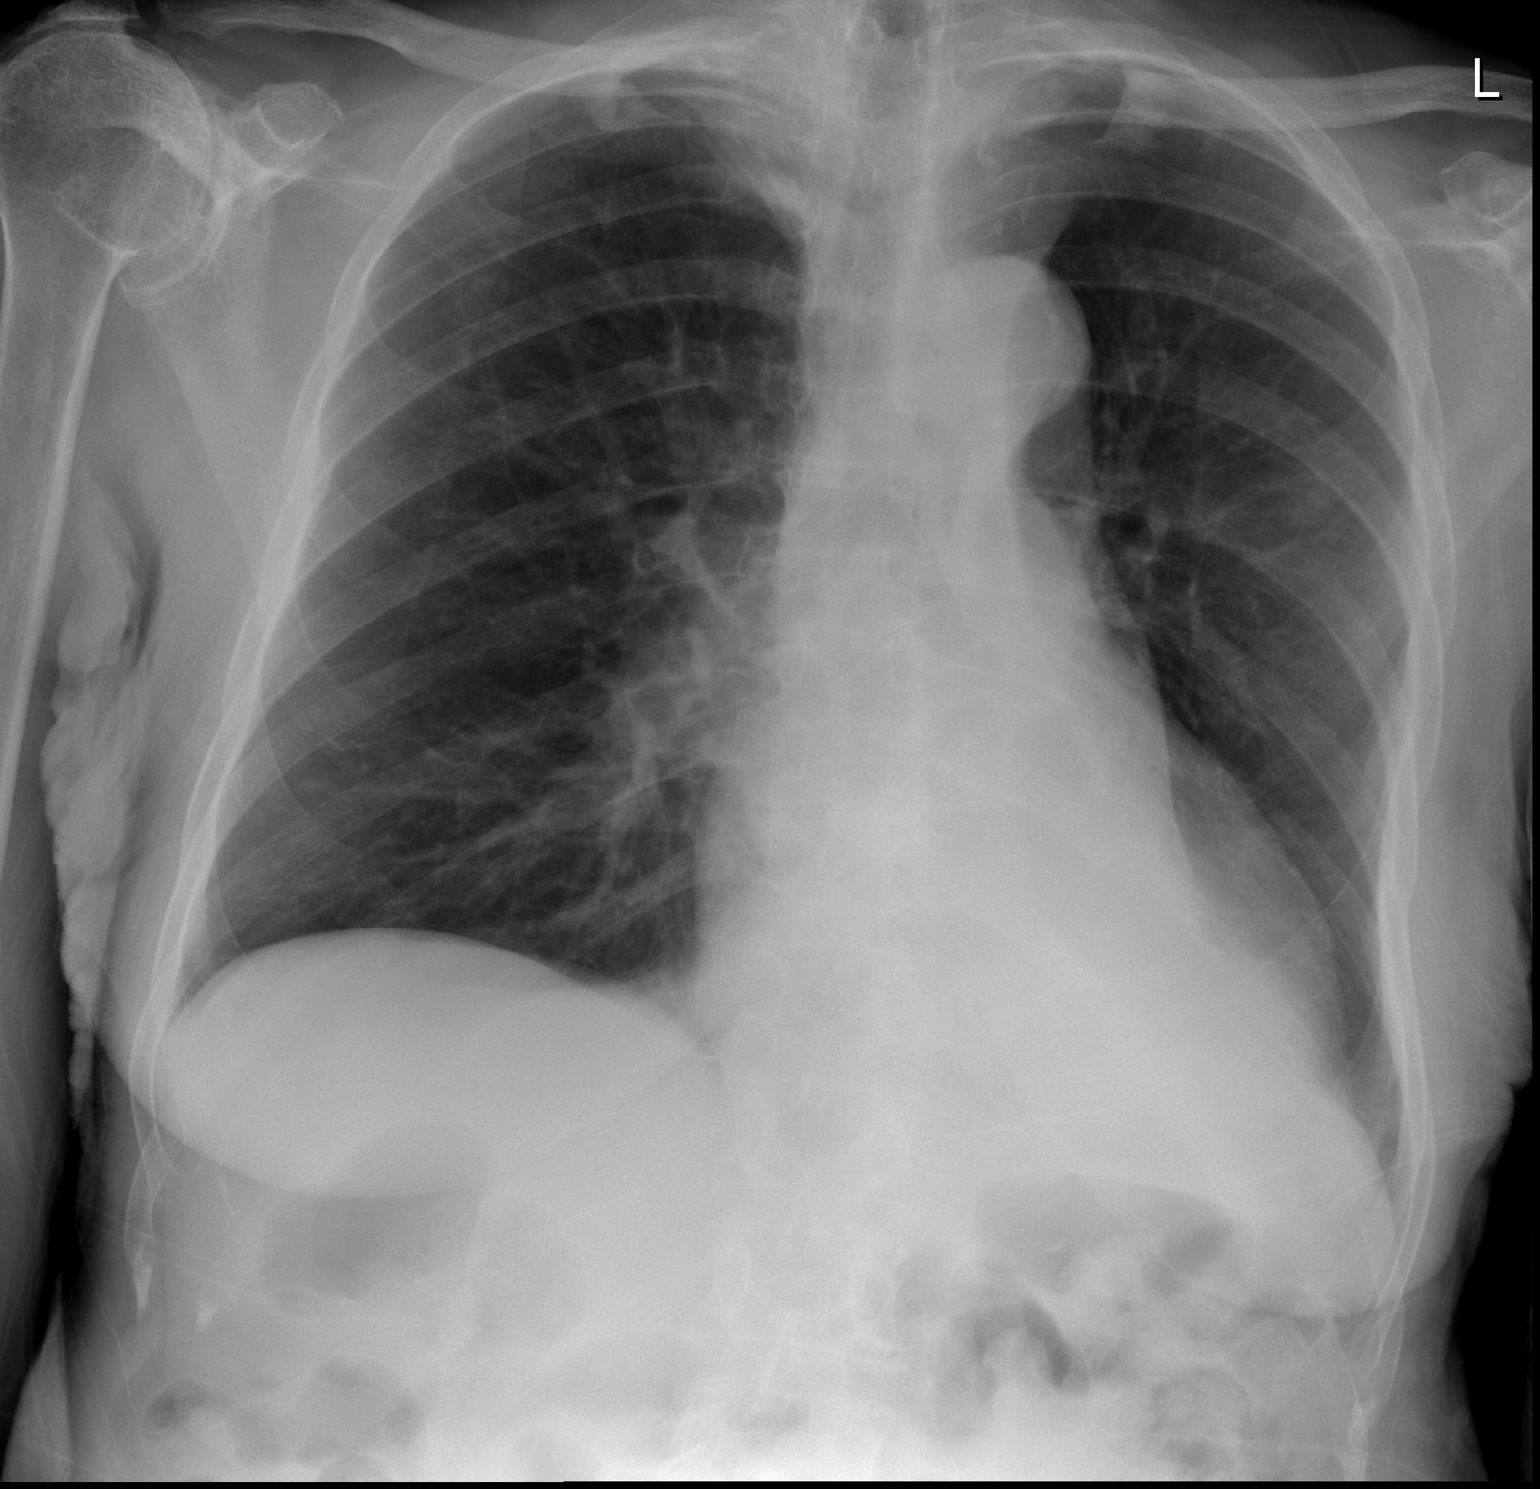

[2 of 2 positions shown; findings below may reference images not displayed]

FINDINGS: Evidence of new left lower lobe collapse with volume loss in the
left hemithorax and mediastinal shift to the left. The right lung
appears clear. The heart size and mediastinal contours are stable
with diffuse aortic tortuosity. There may be a small left pleural
effusion. The bones appear unchanged. There are degenerative changes
in the spine with evidence of chronic rotator cuff tear on the
right.
IMPRESSION: Left lower lobe collapse which could be secondary to central
obstructing tumor or pneumonia. If there is clinical evidence for
pneumonia, suggest short-term radiographic follow-up after
appropriate antibiotic therapy. If more definitive evaluation is
clinically warranted at this time, consider chest CT with contrast.

## 2022-07-25 IMAGING — CT CT ABD-PELV W/O CM
2 of 4 series · 15 of 46 positions shown, 17 images · non-contrast
Comparison: CT of the abdomen pelvis dated 07/06/2020.

CLINICAL DATA: Abdominal pain.

EXAM:
CT ABDOMEN AND PELVIS WITHOUT CONTRAST
TECHNIQUE: Multidetector CT imaging of the abdomen and pelvis was performed
following the standard protocol without IV contrast.

[Series 3: ap without · axial · non-contrast · 0.68mm/px · z∈[+400,+740]mm · 12 of 77 slices shown, 14 images]
[im 5/77  soft-tissue]
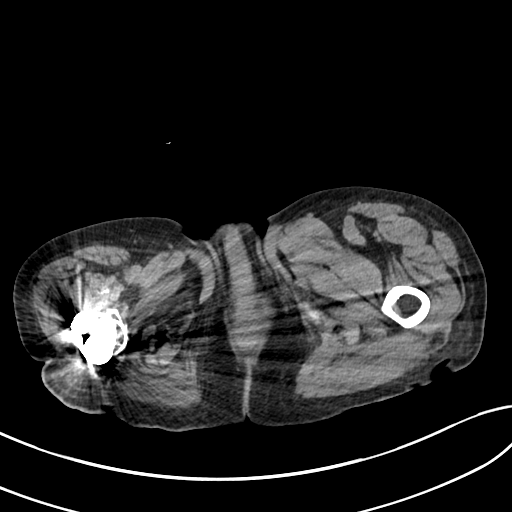
[im 5/77  bone]
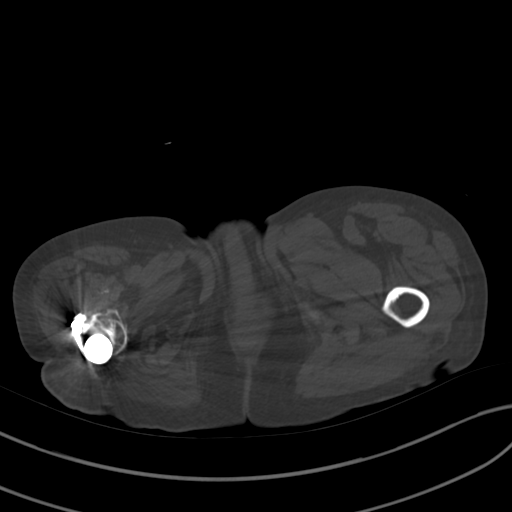
[im 13/77  soft-tissue]
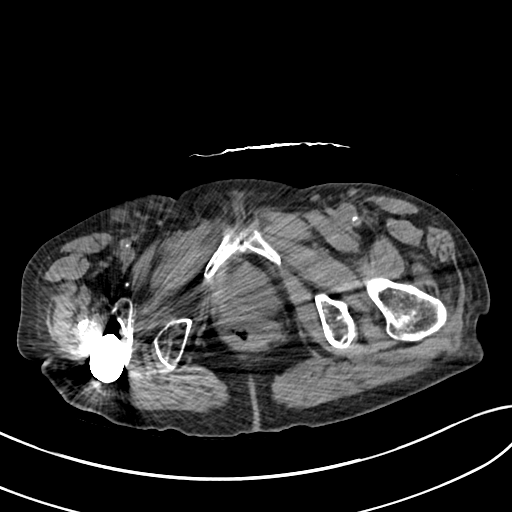
[im 17/77  soft-tissue]
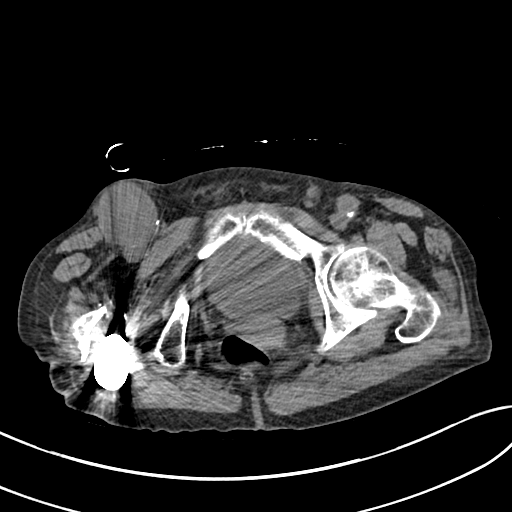
[im 25/77  soft-tissue]
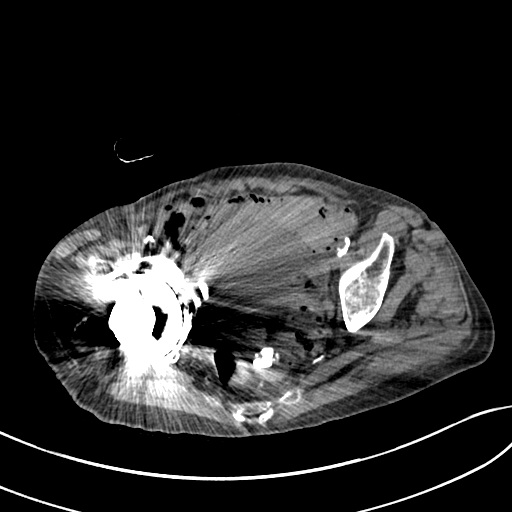
[im 29/77  soft-tissue]
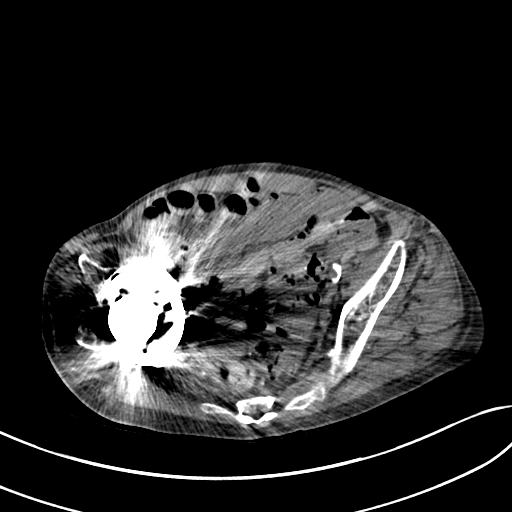
[im 37/77  soft-tissue]
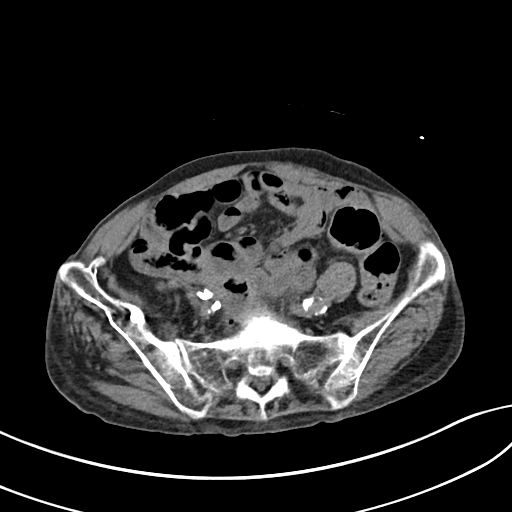
[im 41/77  soft-tissue]
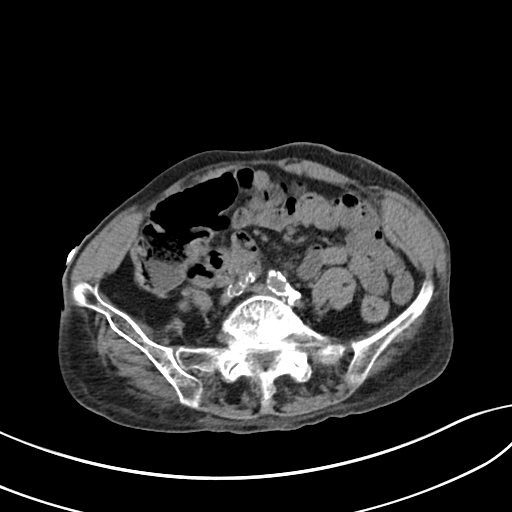
[im 49/77  soft-tissue]
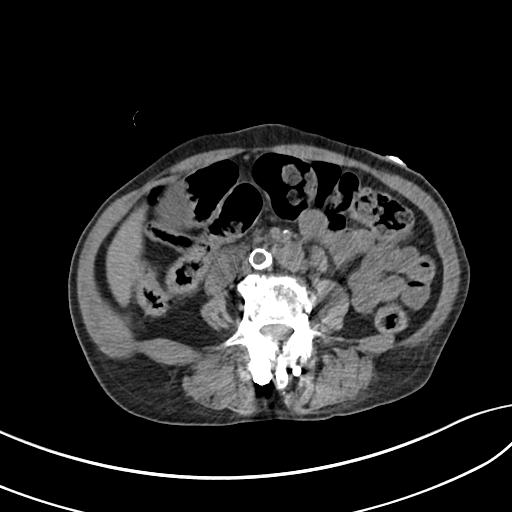
[im 53/77  soft-tissue]
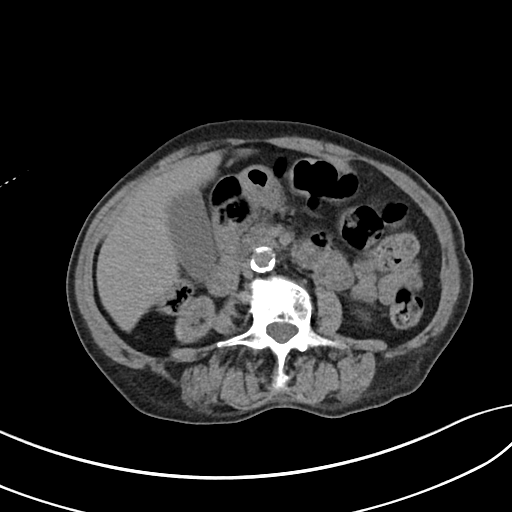
[im 53/77  bone]
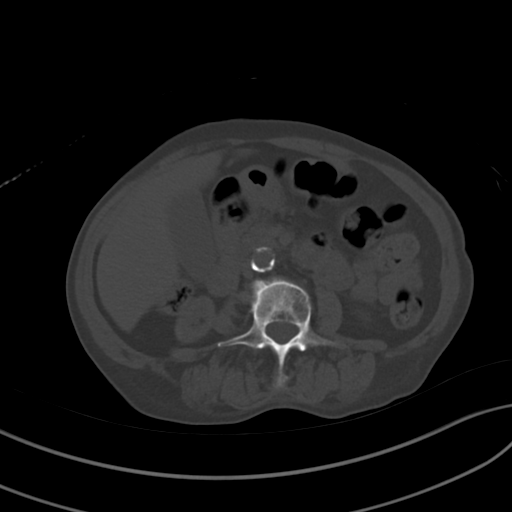
[im 61/77  soft-tissue]
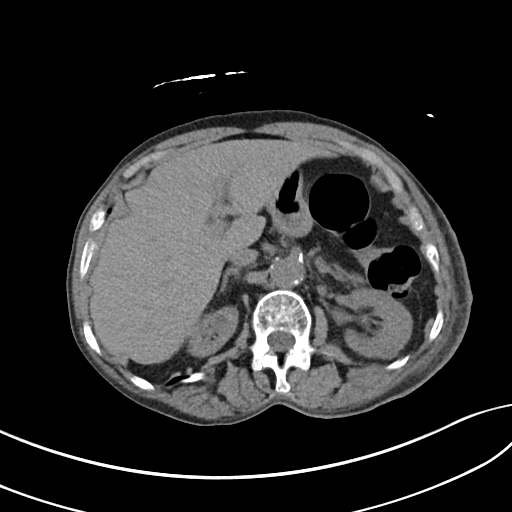
[im 65/77  soft-tissue]
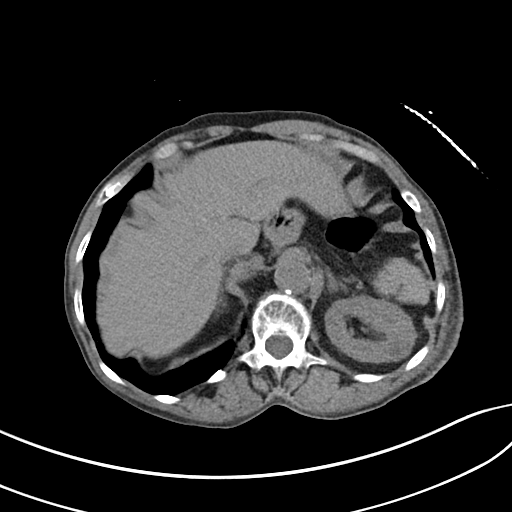
[im 73/77  soft-tissue]
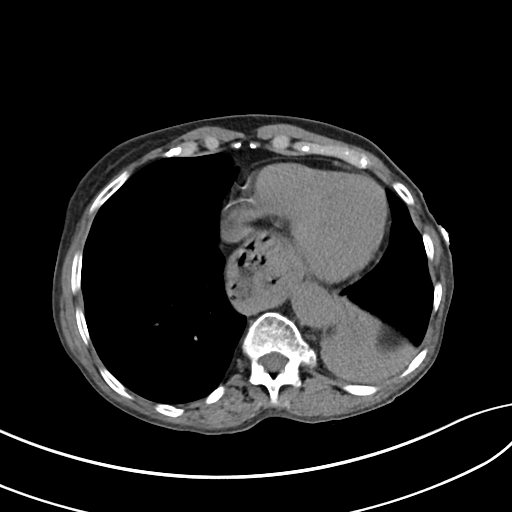

[Series 7: cor · coronal · 0.67mm/px · 3 of 63 slices shown]
[im 21/63  soft-tissue]
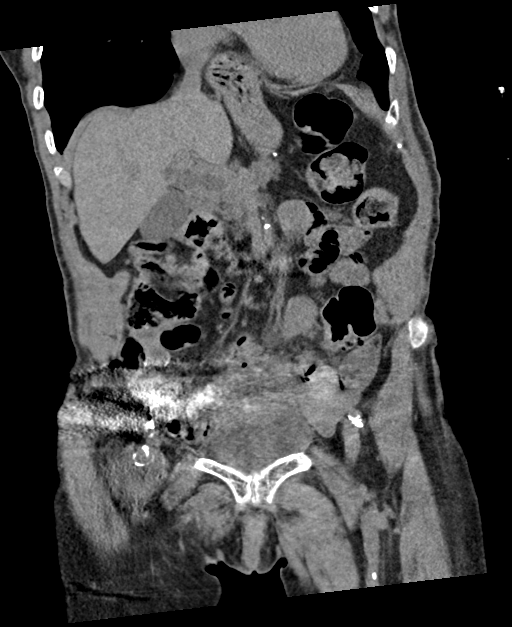
[im 28/63  soft-tissue]
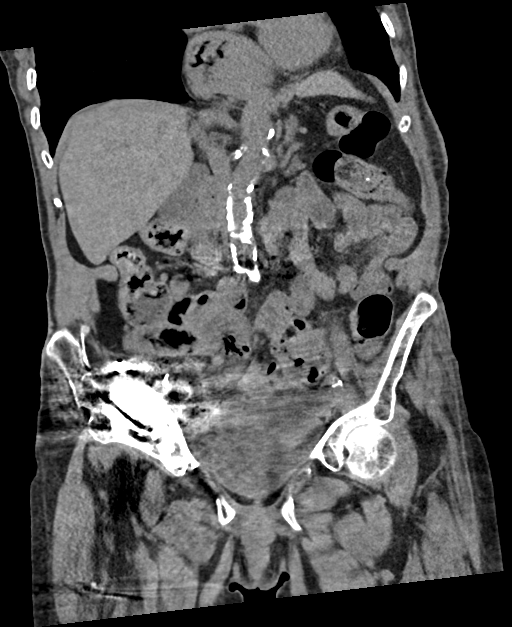
[im 35/63  soft-tissue]
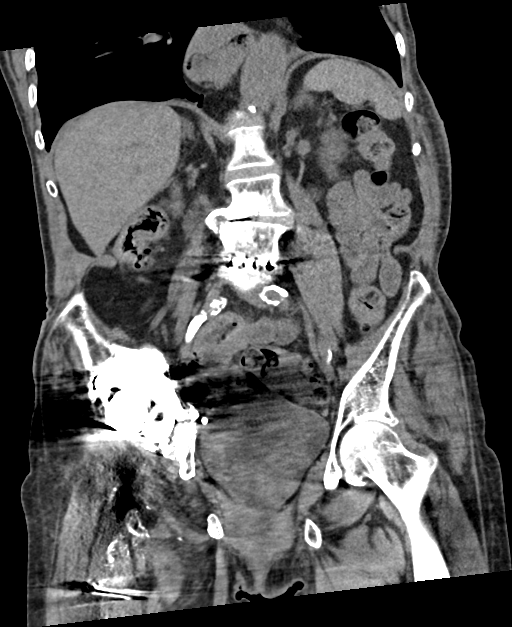

[15 of 46 positions shown; findings below may reference images not displayed]

FINDINGS: Evaluation of this exam is limited in the absence of intravenous
contrast.

Lower chest: There is consolidative changes of the visualized left
lower lobe with volume loss consistent with complete atelectasis. A
centrally occlusive lesion is not excluded. Chest CT may provide
better evaluation if there is clinical concern for and occlusive
mass. Coronary vascular calcification.

Intra-abdominal free air or free fluid.

Hepatobiliary: The liver is unremarkable. No intrahepatic biliary
ductal dilatation. The gallbladder is unremarkable.

Pancreas: Moderate atrophy of the pancreas.

Spleen: Normal in size without focal abnormality.

Adrenals/Urinary Tract: The adrenal glands are unremarkable moderate
right renal parenchyma atrophy. Faint small hypodense lesions in the
left kidney are not characterized on this CT. There is no
hydronephrosis or nephrolithiasis on either side. The visualized
ureters and urinary bladder appear unremarkable.

Stomach/Bowel: There is moderate size hiatal hernia. There is
sigmoid diverticulosis without active inflammatory changes. There is
no bowel obstruction or active inflammation. The appendix is normal.

Vascular/Lymphatic: Advanced aortoiliac atherosclerotic disease. The
IVC is grossly unremarkable. No pain venous gas. There is no
adenopathy.

A femoral femoral bypass graft is noted. There has been interval
increase in the size of the fluid collection or pseudoaneurysm in
the bypass anastomosis in the right groin measuring 4.8 x 4.1 cm
(previously 2.6 x 2.6 cm). Evaluation of the vasculature is limited
in the absence of contrast.

Reproductive: Calcified uterine fibroid.

Other: None

Musculoskeletal: Osteopenia with degenerative changes of the spine.
Lower lumbar fusion and right hip arthroplasty. No acute osseous
pathology.
IMPRESSION: 1. No acute intra-abdominal or pelvic pathology.
2. Sigmoid diverticulosis. No bowel obstruction. Normal appendix.
3. Interval increase in the size of the fluid collection or
pseudoaneurysm in the bypass anastomosis in the right groin.
4. Complete atelectasis of the visualized left lower lobe. A
centrally occlusive lesion is not excluded. Chest CT may provide
better evaluation if there is clinical concern for and occlusive
mass.
5. Moderate size hiatal hernia.
6. Aortic Atherosclerosis ([id number]-IZU.U).

## 2022-07-25 IMAGING — CT CT CHEST W/O CM
2 of 4 series · 15 of 36 positions shown, 18 images · non-contrast
Comparison: 01/02/2020

CLINICAL DATA: Minor chest trauma

EXAM:
CT CHEST WITHOUT CONTRAST
TECHNIQUE: Multidetector CT imaging of the chest was performed following the
standard protocol without IV contrast.

[Series 5: thorax 2.0 · axial · 0.61mm/px · z∈[+1171,+1403]mm · 12 of 130 slices shown, 15 images]
[im 7/130  mediastinal]
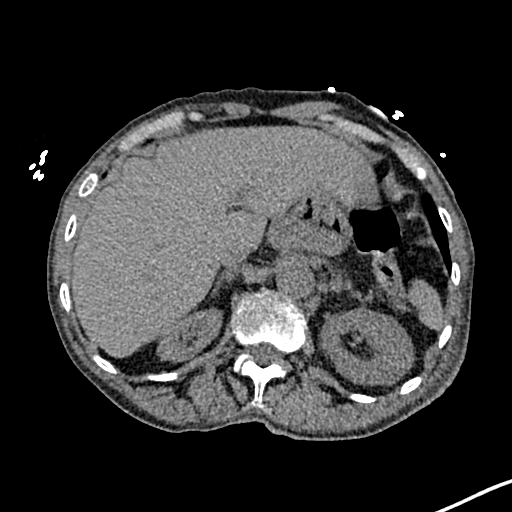
[im 7/130  lung]
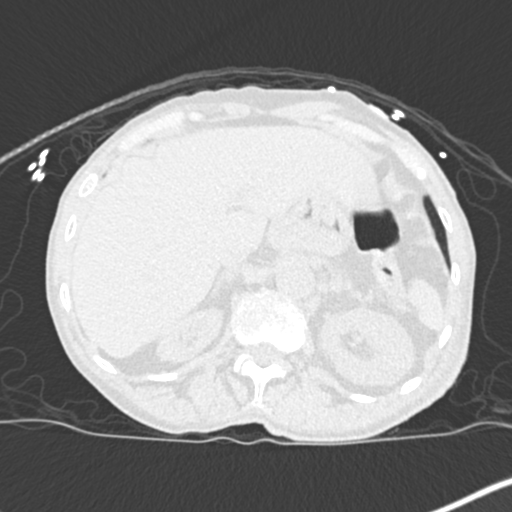
[im 21/130  lung]
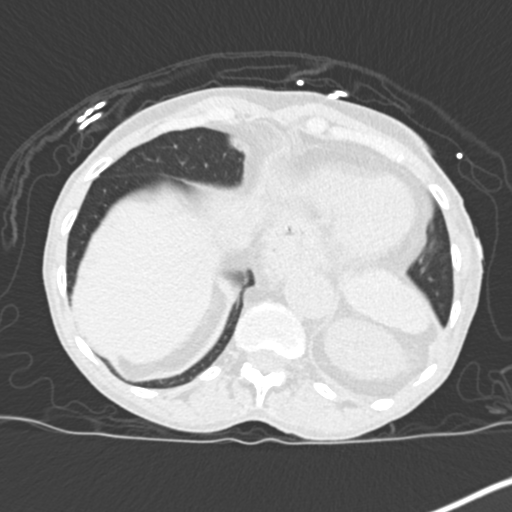
[im 28/130  lung]
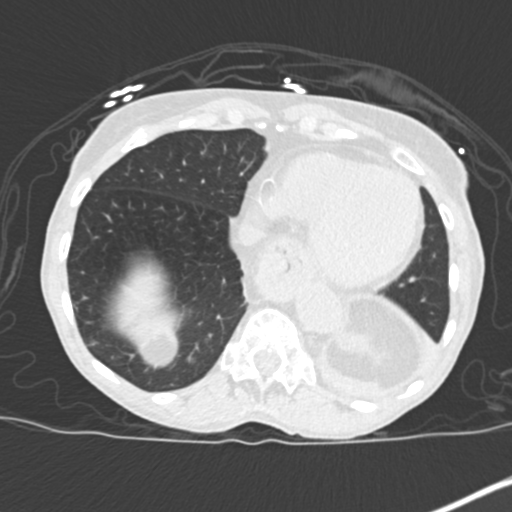
[im 41/130  lung]
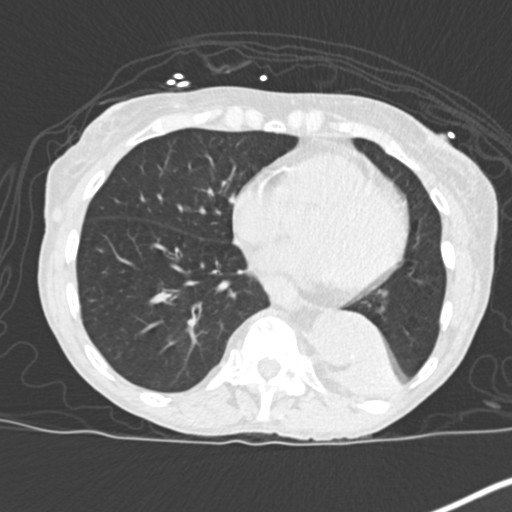
[im 48/130  mediastinal]
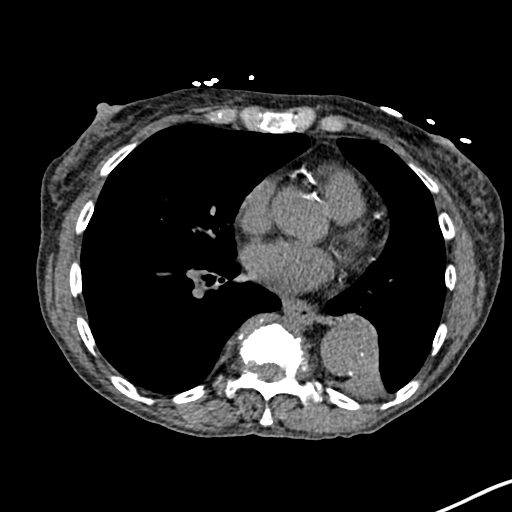
[im 48/130  lung]
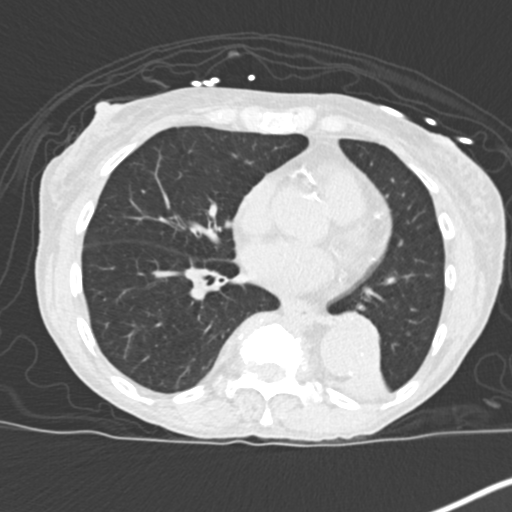
[im 62/130  lung]
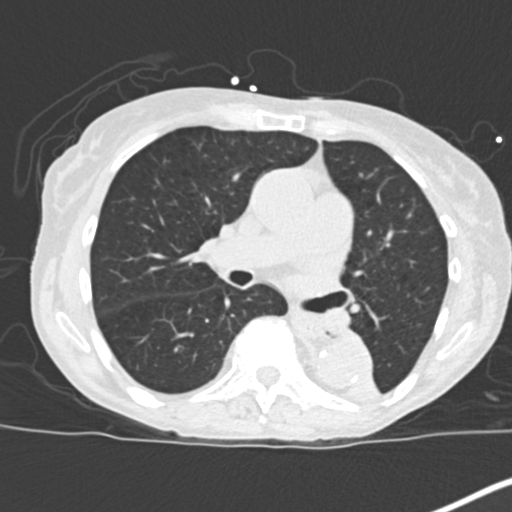
[im 68/130  lung]
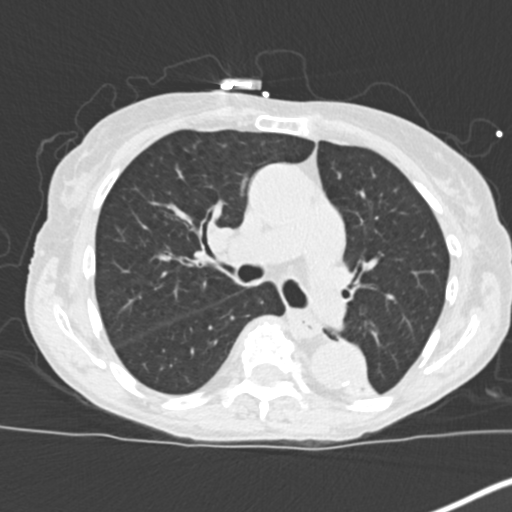
[im 82/130  lung]
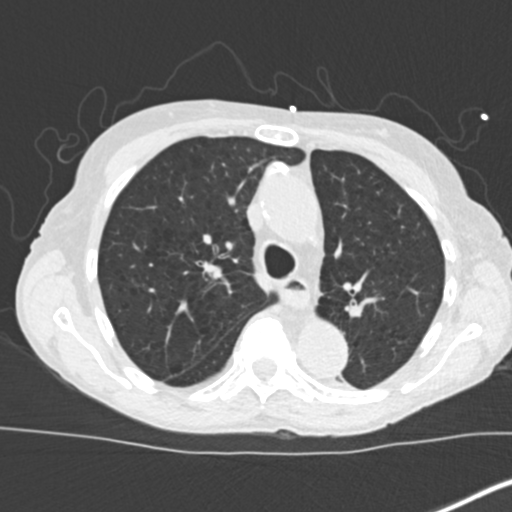
[im 89/130  mediastinal]
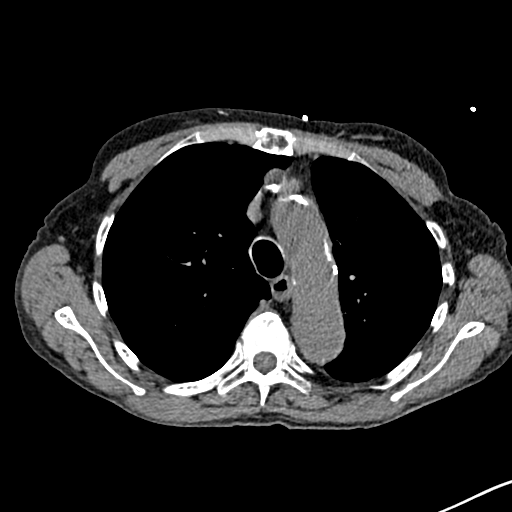
[im 89/130  lung]
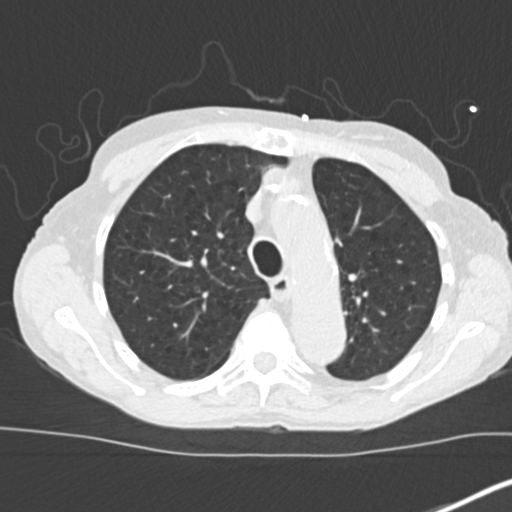
[im 102/130  lung]
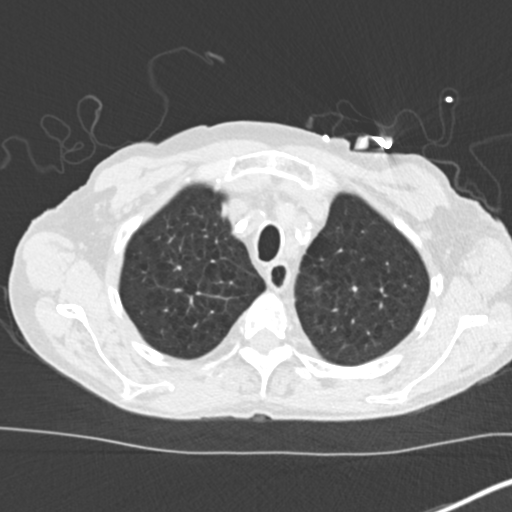
[im 109/130  lung]
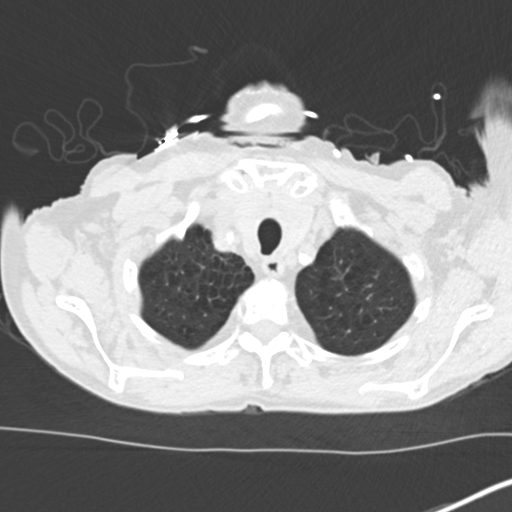
[im 123/130  lung]
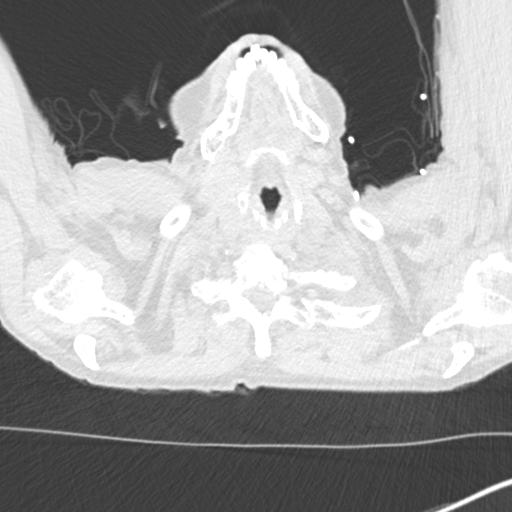

[Series 7: coronal · coronal · 0.59mm/px · 3 of 71 slices shown]
[im 15/71  lung]
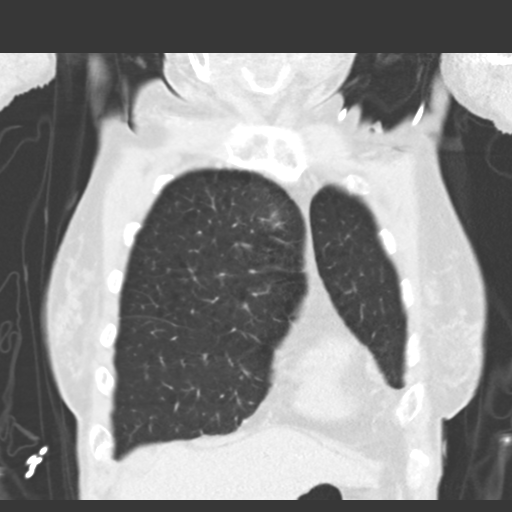
[im 29/71  lung]
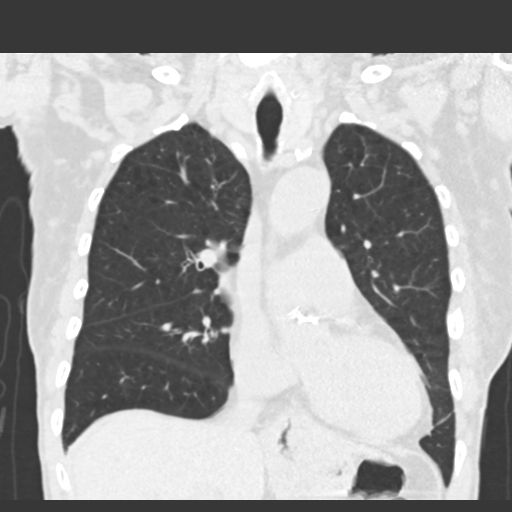
[im 43/71  lung]
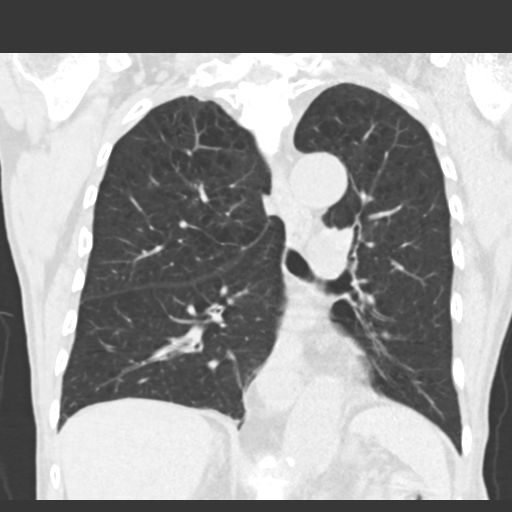

[15 of 36 positions shown; findings below may reference images not displayed]

FINDINGS: Cardiovascular: Normal heart size. No pericardial effusion.
Atheromatous calcification of the aorta and coronaries. Extensive
atherosclerosis of the great vessels.

Mediastinum/Nodes: Small or moderate sliding hiatal hernia. No
adenopathy.

Lungs/Pleura: Left lower lobe collapse with low-density lower lobe
airway filling. Centrilobular emphysema. Two small pulmonary nodules
in the right lower lobe, stable and considered benign.

Upper Abdomen: No acute finding. Partially covered right kidney
showing atrophy.

Musculoskeletal: Diffuse degenerative disc collapse. T10-11 interval
disc collapse with sclerosis and endplate cysts/erosions. Advanced
biforaminal impingement at this level. No soft tissue calcification
or visible paravertebral edema.
IMPRESSION: 1. Left lower lobe collapse. Associated airways are low-density with
no visible mass or adenopathy. Recommend radiographic follow-up to
clearing.
2. T10-11 advanced disc disease since 9597. No visible paravertebral
inflammation typical of acute discitis, please correlate with labs.
There is advanced biforaminal impingement at this level.
3. Aortic Atherosclerosis (501LF-K11.1) and Emphysema (501LF-Q2L.1).

## 2022-08-10 IMAGING — DX DG ABDOMEN 1V
2 series · 2 of 2 positions shown · non-contrast
Comparison: 12/10/2018

CLINICAL DATA: Constipation

EXAM:
ABDOMEN - 1 VIEW

[abdomen supine (1 of 2)]
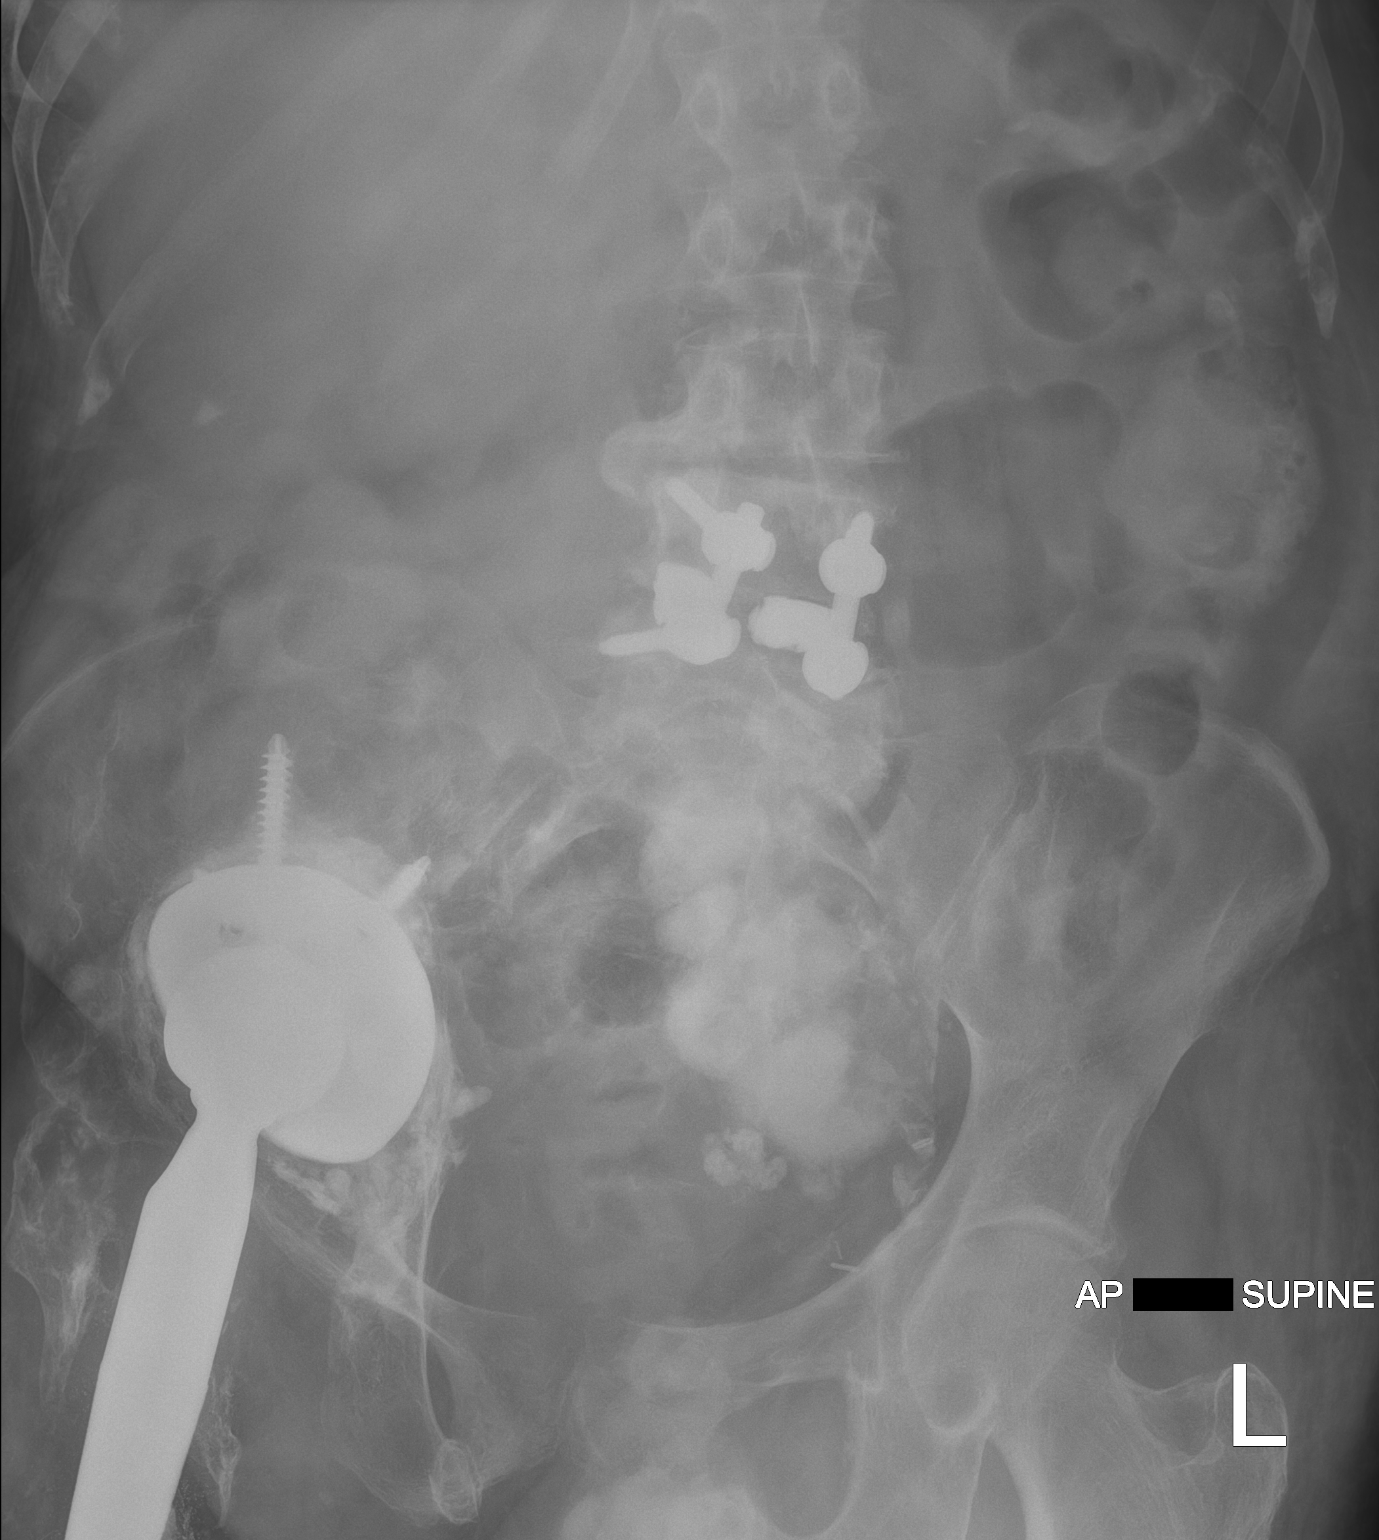

[abdomen supine (2 of 2)]
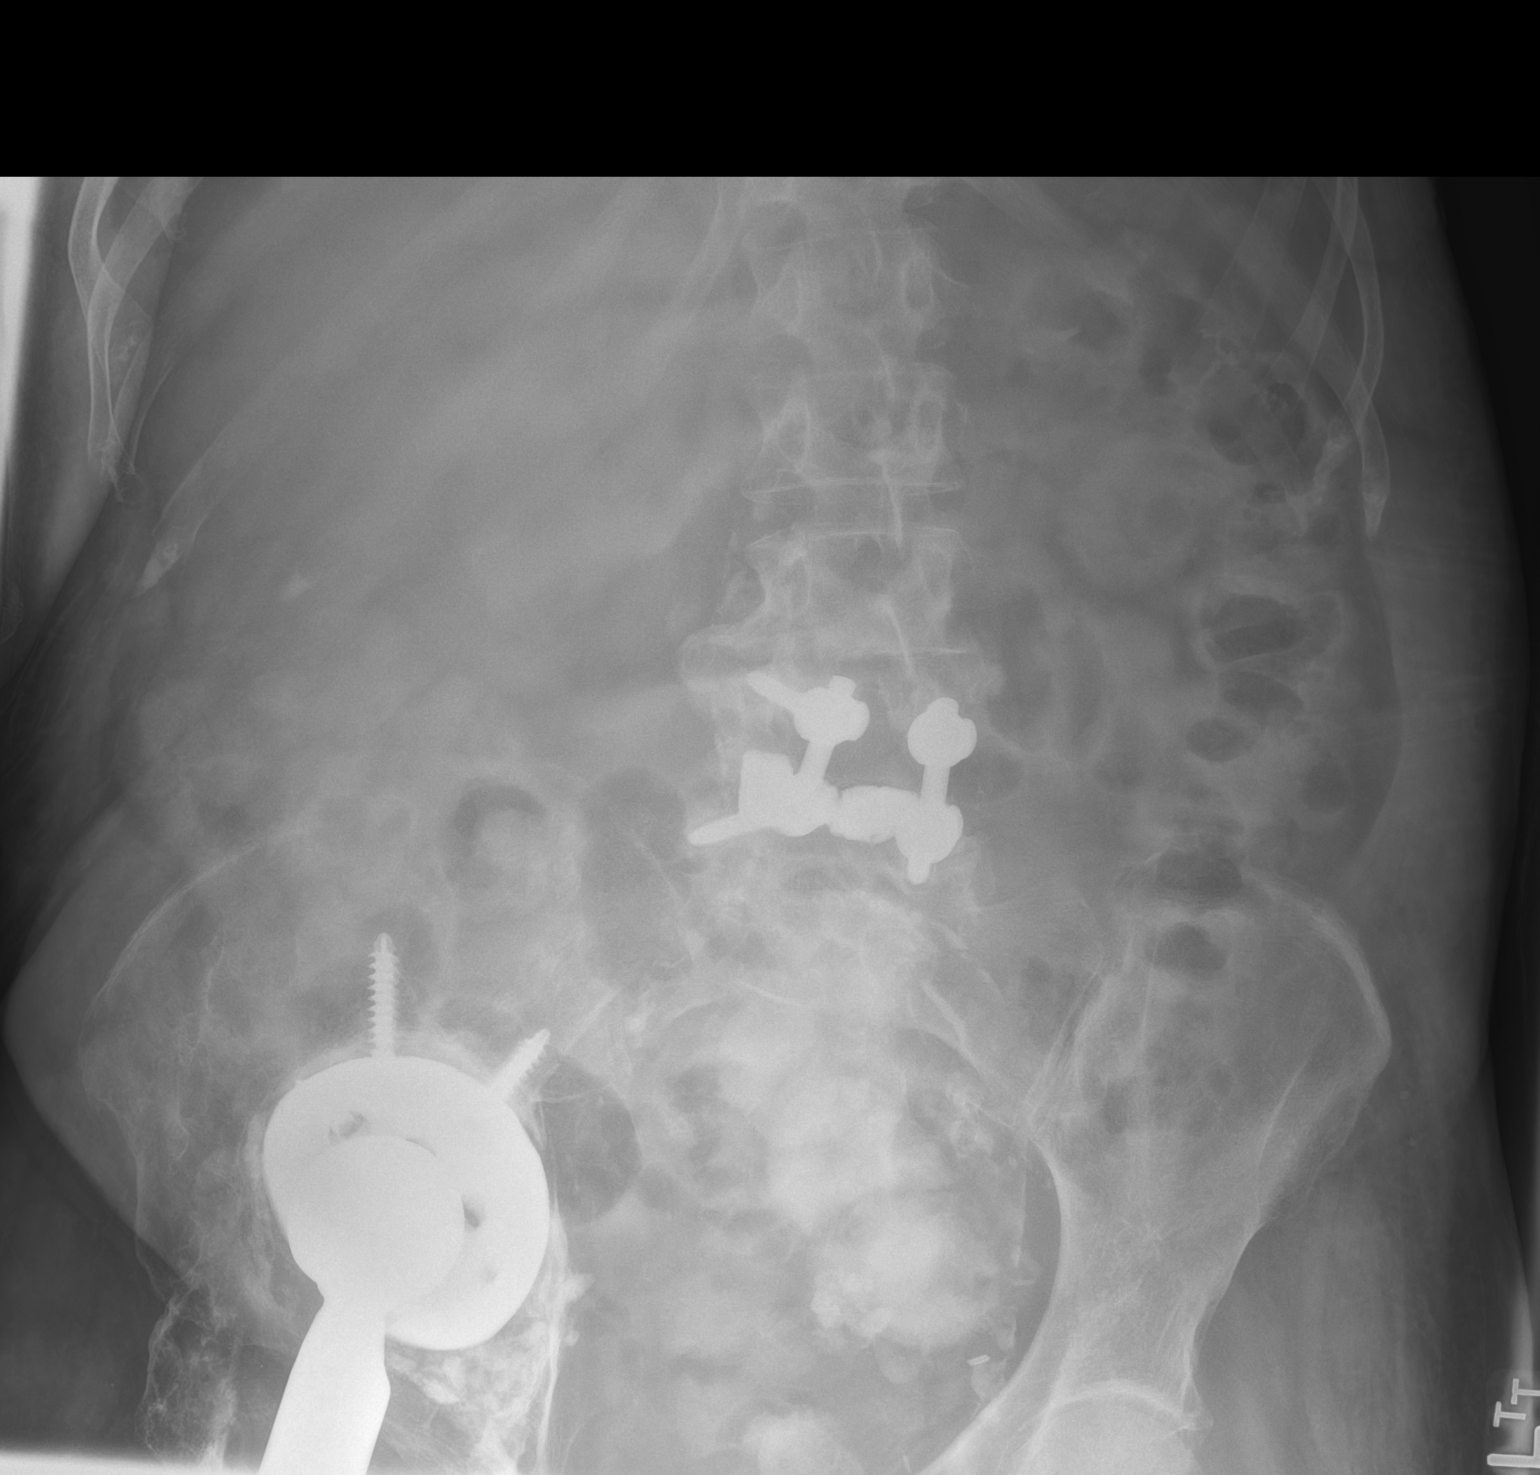

[2 of 2 positions shown; findings below may reference images not displayed]

FINDINGS: Nonobstructed bowel-gas pattern with mild to moderate stool.
Radiopaque material in the rectosigmoid colon. Hardware in the
lumbar spine and right hip. Chronic osseous deformity of the right
hip. Probable calcified uterine fibroids
IMPRESSION: Nonobstructed gas pattern with mild to moderate stool in the colon

## 2022-08-10 IMAGING — CT CT HEAD W/O CM
4 series · 16 of 47 positions shown, 18 images · non-contrast
Comparison: 09/29/2020

CLINICAL DATA: Delirium

EXAM:
CT HEAD WITHOUT CONTRAST
TECHNIQUE: Contiguous axial images were obtained from the base of the skull
through the vertex without intravenous contrast.

[Series 3: head wo · axial · 0.38mm/px · z∈[-115,-10]mm · 7 of 29 slices shown, 9 images]
[im 4/29  brain]
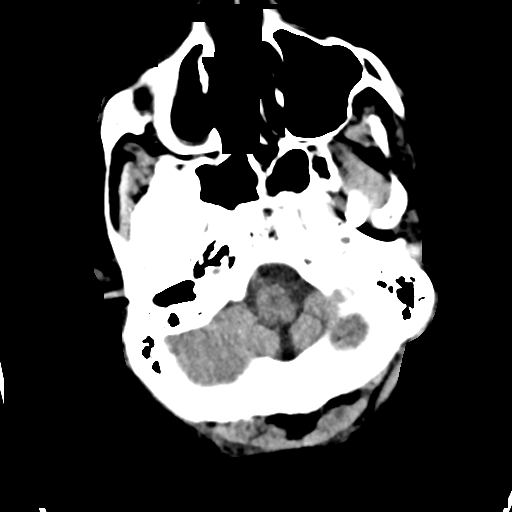
[im 4/29  bone]
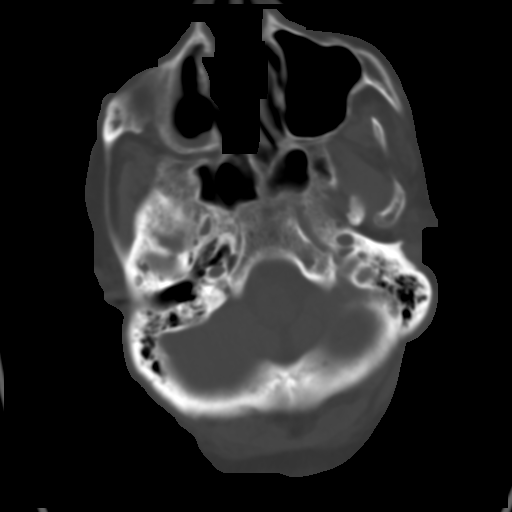
[im 8/29  brain]
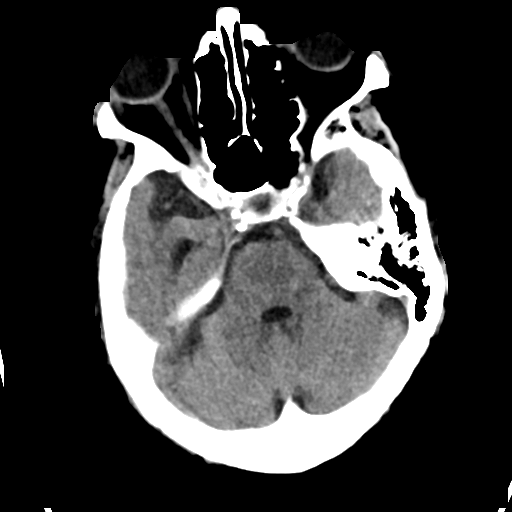
[im 11/29  brain]
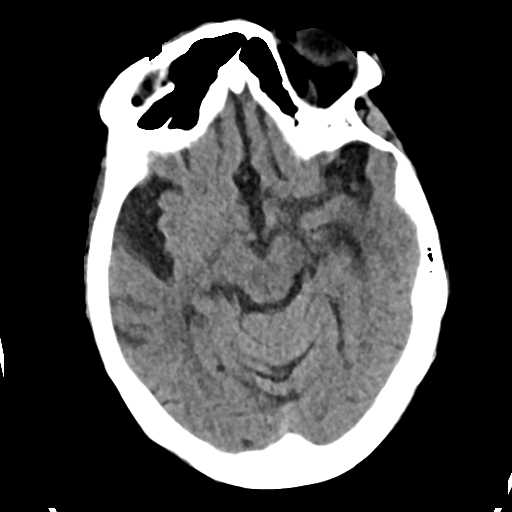
[im 15/29  brain]
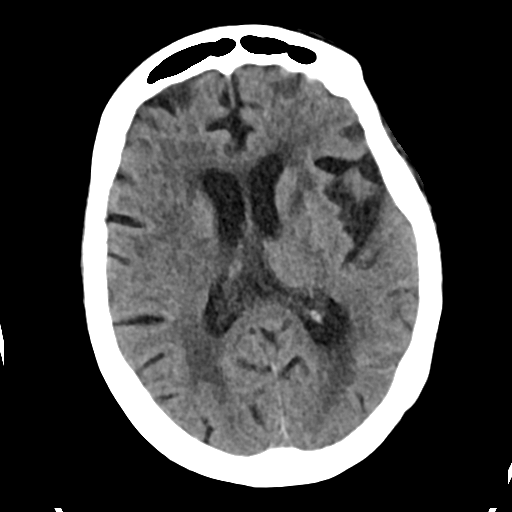
[im 18/29  brain]
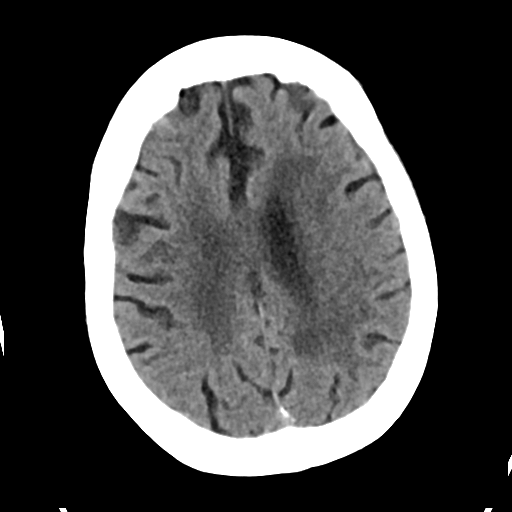
[im 18/29  bone]
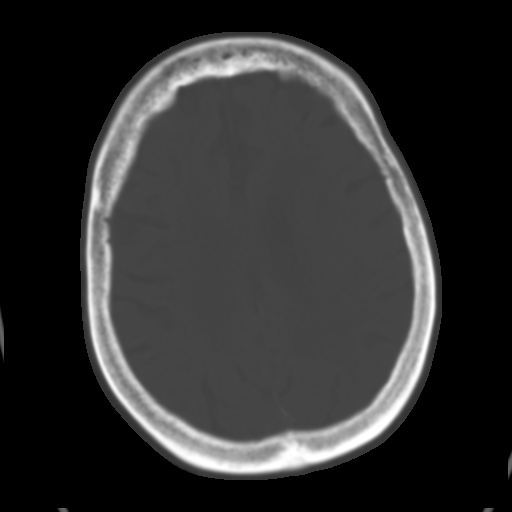
[im 22/29  brain]
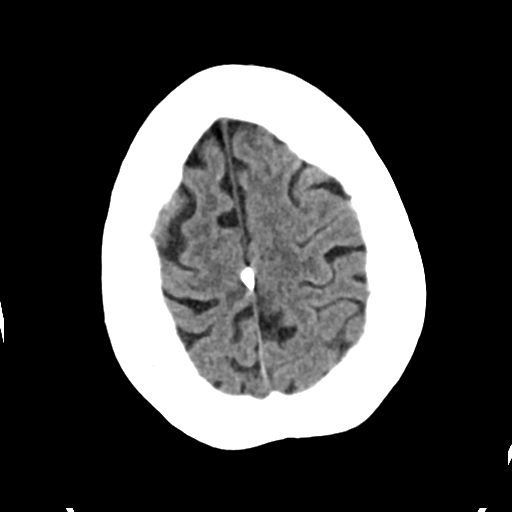
[im 25/29  brain]
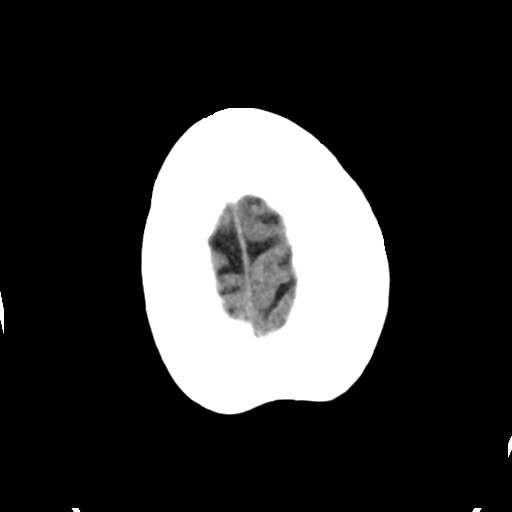

[Series 4: head bone · axial · 0.38mm/px · z∈[-116,-88]mm · 3 of 73 slices shown]
[im 8/73  bone]
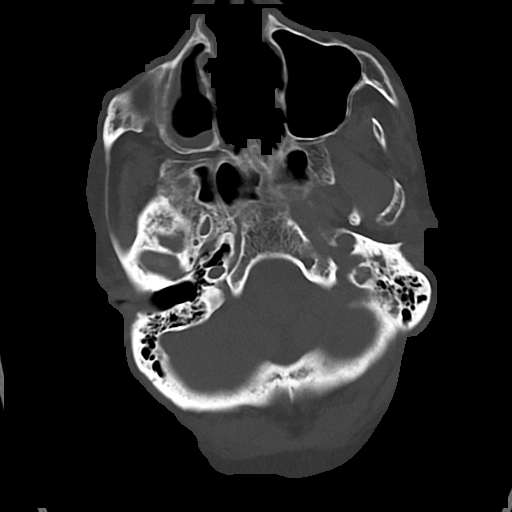
[im 15/73  bone]
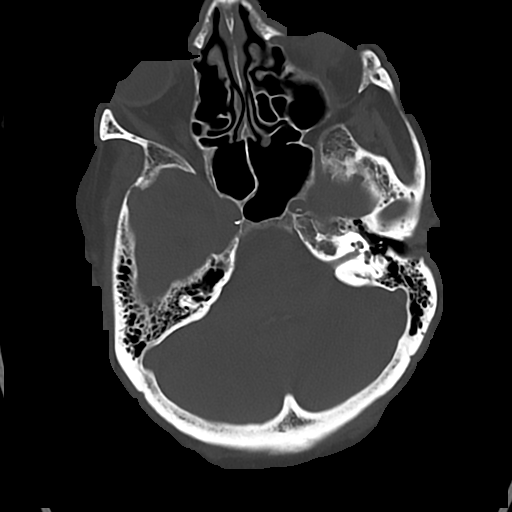
[im 22/73  bone]
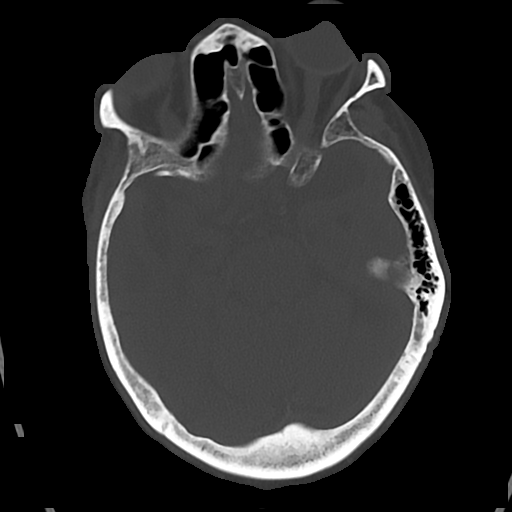

[Series 5: cor soft · coronal · 0.29mm/px · 3 of 69 slices shown]
[im 23/69  brain]
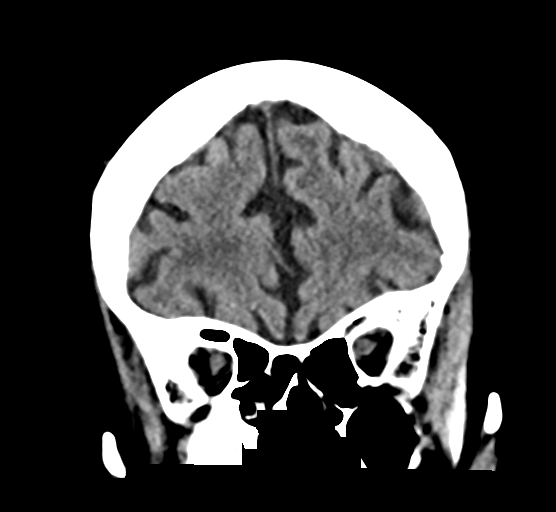
[im 31/69  brain]
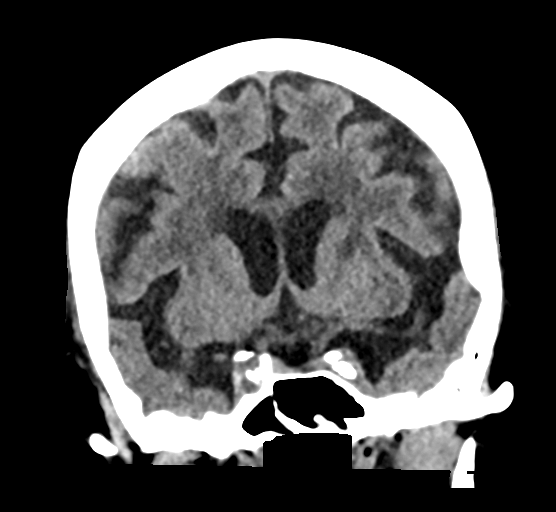
[im 38/69  brain]
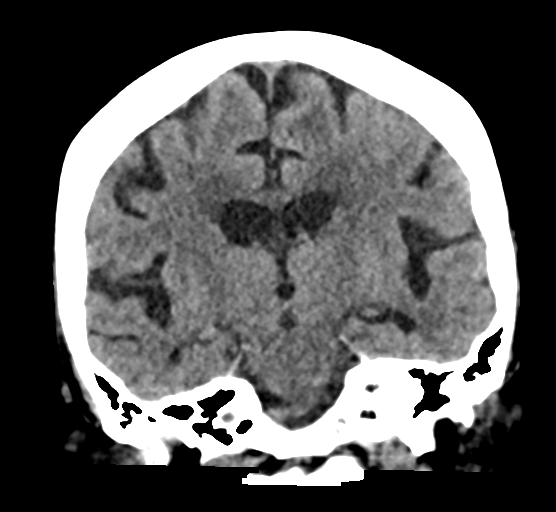

[Series 6: sag soft · sagittal · 0.30mm/px · 3 of 54 slices shown]
[im 18/54  brain]
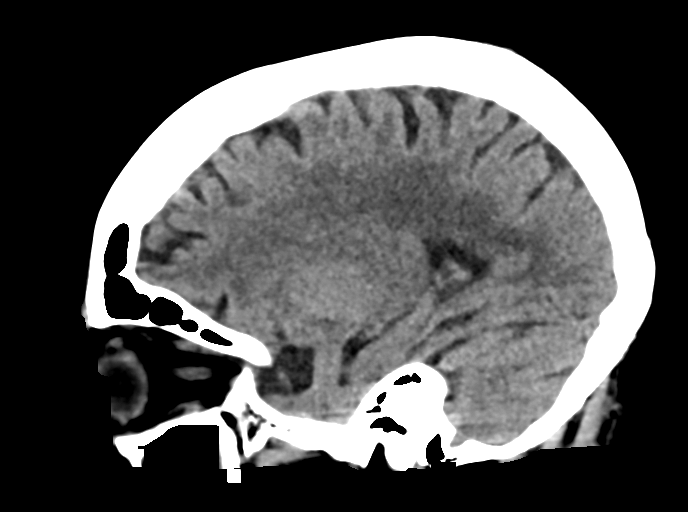
[im 27/54  brain]
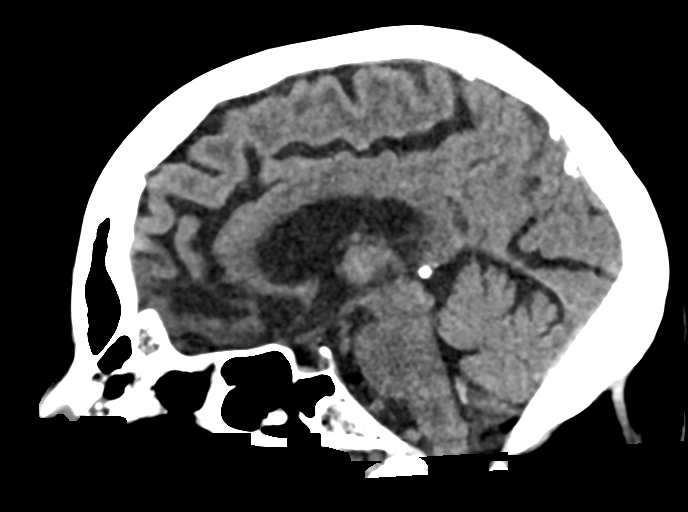
[im 36/54  brain]
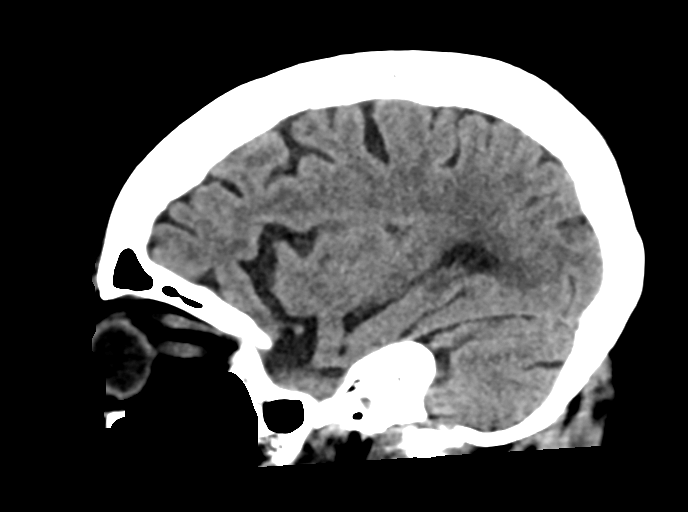

[16 of 47 positions shown; findings below may reference images not displayed]

FINDINGS: Brain: There is no mass, hemorrhage or extra-axial collection. There
is generalized atrophy without lobar predilection. Hypodensity of
the white matter is most commonly associated with chronic
microvascular disease. Old left basal ganglia small vessel infarct.

Vascular: No abnormal hyperdensity of the major intracranial
arteries or dural venous sinuses. No intracranial atherosclerosis.

Skull: The visualized skull base, calvarium and extracranial soft
tissues are normal.

Sinuses/Orbits: Postsurgical changes of right maxillary sinus with
mild mucosal thickening. The orbits are normal.
IMPRESSION: 1. No acute intracranial abnormality.
2. Generalized atrophy and chronic microvascular ischemia.

## 2022-08-26 IMAGING — CT CT HEAD W/O CM
4 series · 15 of 47 positions shown, 17 images · non-contrast
Comparison: 07/02/2021

CLINICAL DATA: Head trauma, mod-severe. Un witnessed fall
yesterday, hitting the back of the head. On Plavix.

EXAM:
CT HEAD WITHOUT CONTRAST
TECHNIQUE: Contiguous axial images were obtained from the base of the skull
through the vertex without intravenous contrast.

[Series 3: head without · axial · non-contrast · 0.40mm/px · z∈[-24,+86]mm · 7 of 30 slices shown, 9 images]
[im 4/30  brain]
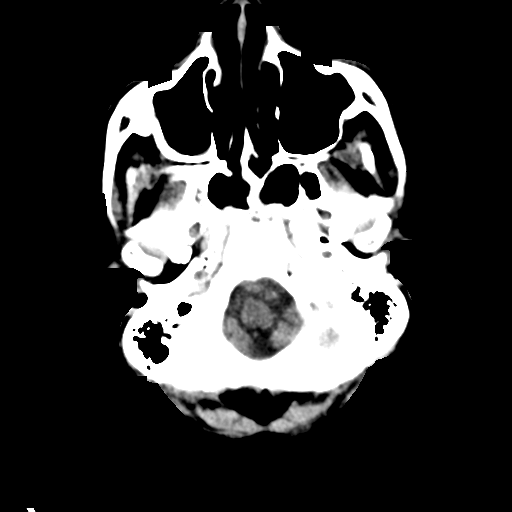
[im 4/30  bone]
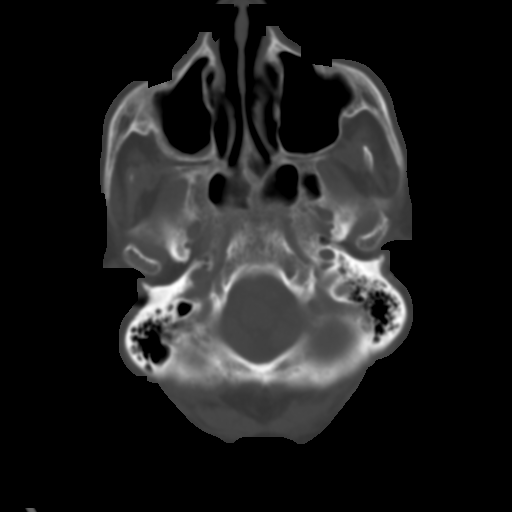
[im 8/30  brain]
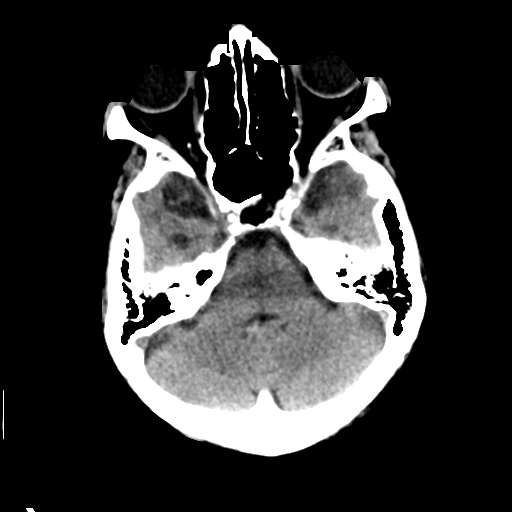
[im 11/30  brain]
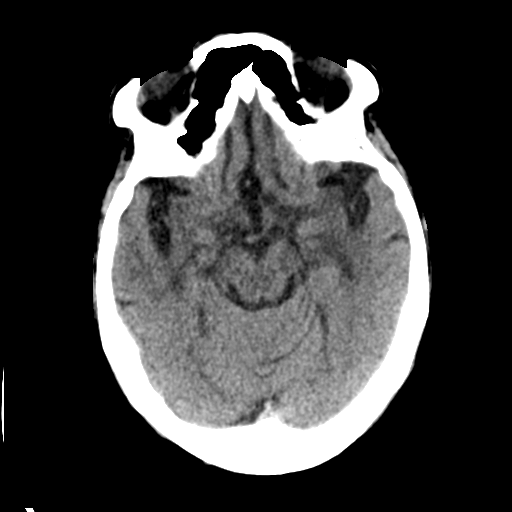
[im 15/30  brain]
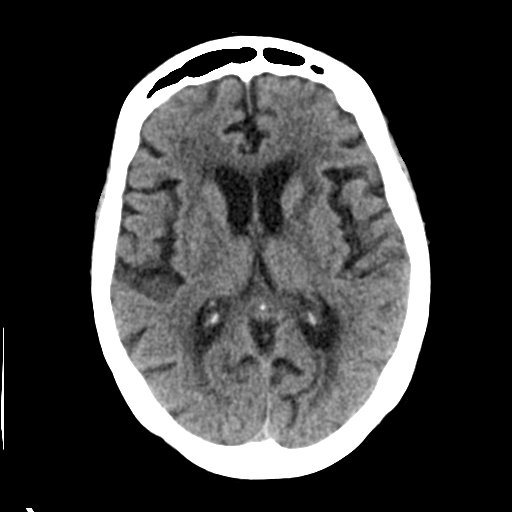
[im 19/30  brain]
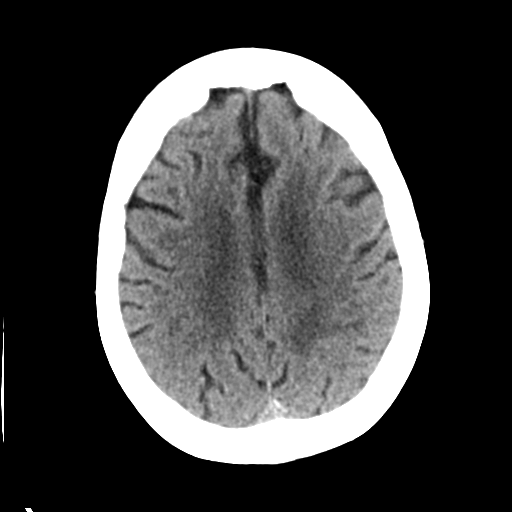
[im 19/30  bone]
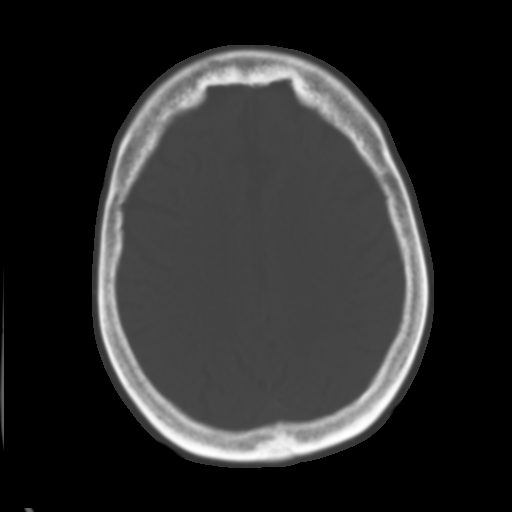
[im 22/30  brain]
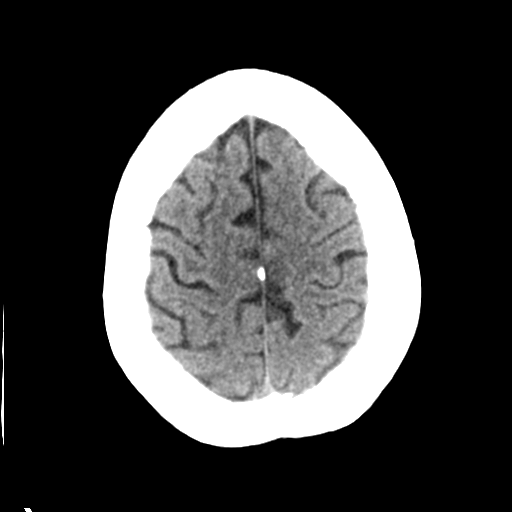
[im 26/30  brain]
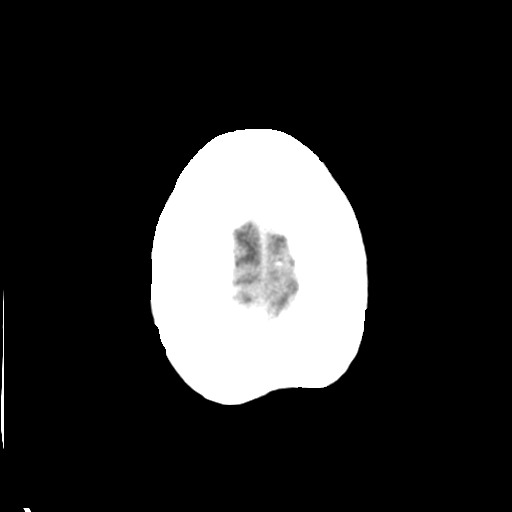

[Series 4: head bone · axial · 0.40mm/px · z∈[-26,-12]mm · 2 of 73 slices shown]
[im 8/73  bone]
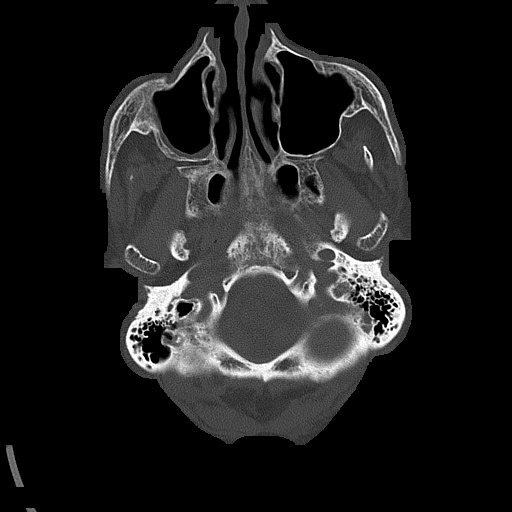
[im 15/73  bone]
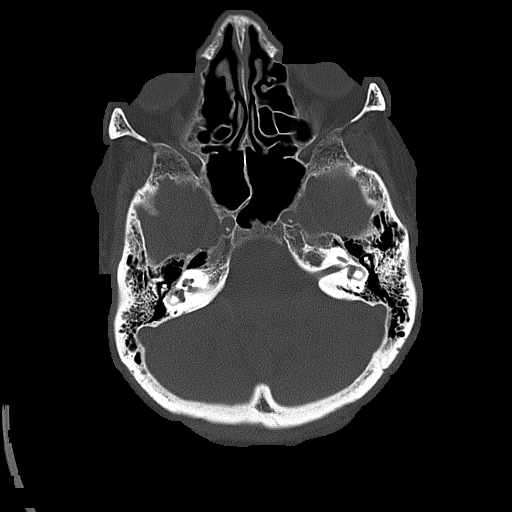

[Series 5: head without cor · coronal · non-contrast · 0.28mm/px · 3 of 67 slices shown]
[im 23/67  brain]
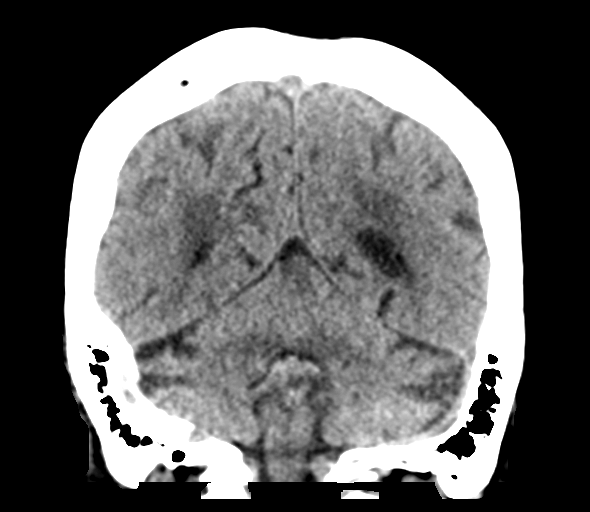
[im 30/67  brain]
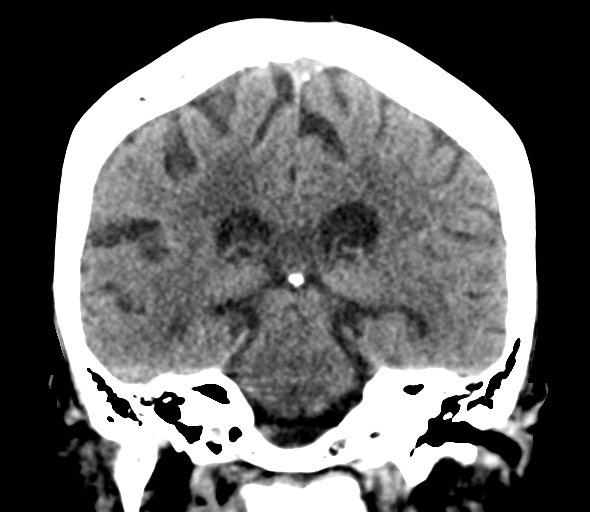
[im 37/67  brain]
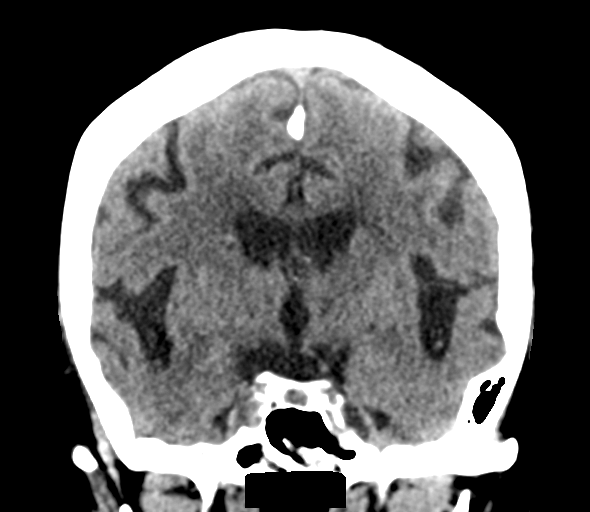

[Series 6: head without sag · sagittal · non-contrast · 0.28mm/px · 3 of 64 slices shown]
[im 22/64  brain]
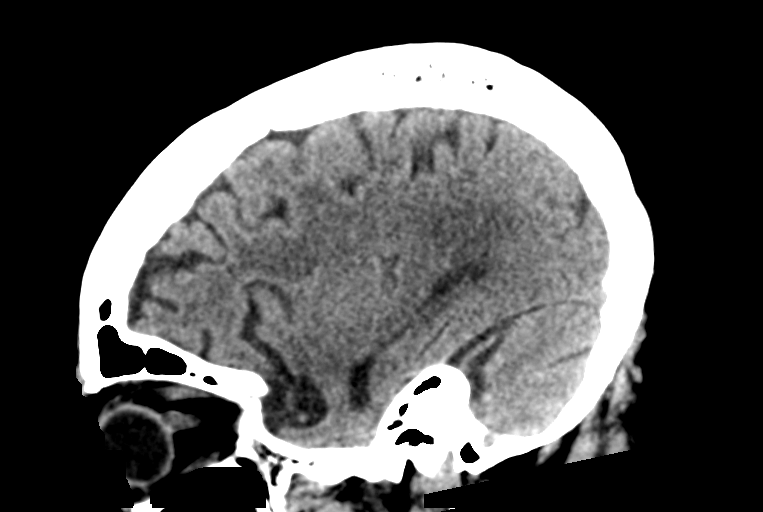
[im 32/64  brain]
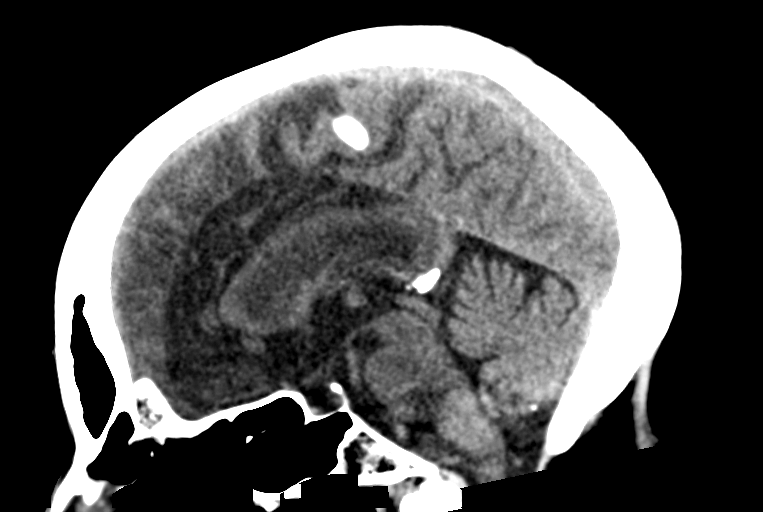
[im 43/64  brain]
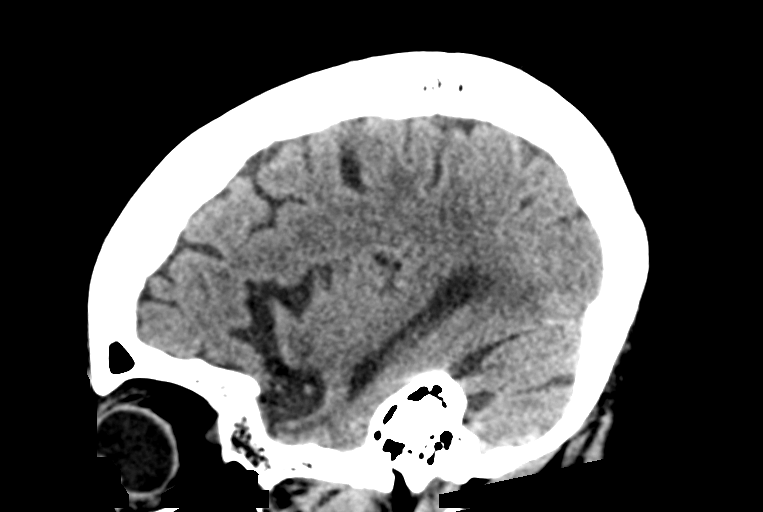

[15 of 47 positions shown; findings below may reference images not displayed]

FINDINGS: Brain: There is no evidence of an acute infarct, intracranial
hemorrhage, mass, midline shift, or extra-axial fluid collection.
Hypodensities in the cerebral white matter bilaterally are unchanged
and nonspecific but compatible with moderate chronic small vessel
ischemic disease. A small chronic infarct is again noted in the left
basal ganglia/internal capsule. Mild cerebral atrophy is most
notable in the perisylvian regions.

Vascular: Calcified atherosclerosis at the skull base. No hyperdense
vessel.

Skull: No fracture or suspicious osseous lesion.

Sinuses/Orbits: Prior sinus surgery including right-sided maxillary
antrostomy and partial ethmoidectomy. Mild mucosal thickening in the
right maxillary sinus. Trace bilateral mastoid effusions. Bilateral
cataract extraction.

Other: None.
IMPRESSION: 1. No evidence of acute intracranial abnormality.
2. Moderate chronic small vessel ischemic disease.

## 2022-08-26 IMAGING — DX DG CHEST 1V PORT
1 series · 1 of 1 positions shown · non-contrast
Comparison: Chest x-ray 06/26/2021.

CLINICAL DATA: History of collapsed lung.  Un witnessed fall.

EXAM:
PORTABLE CHEST 1 VIEW

[chest]
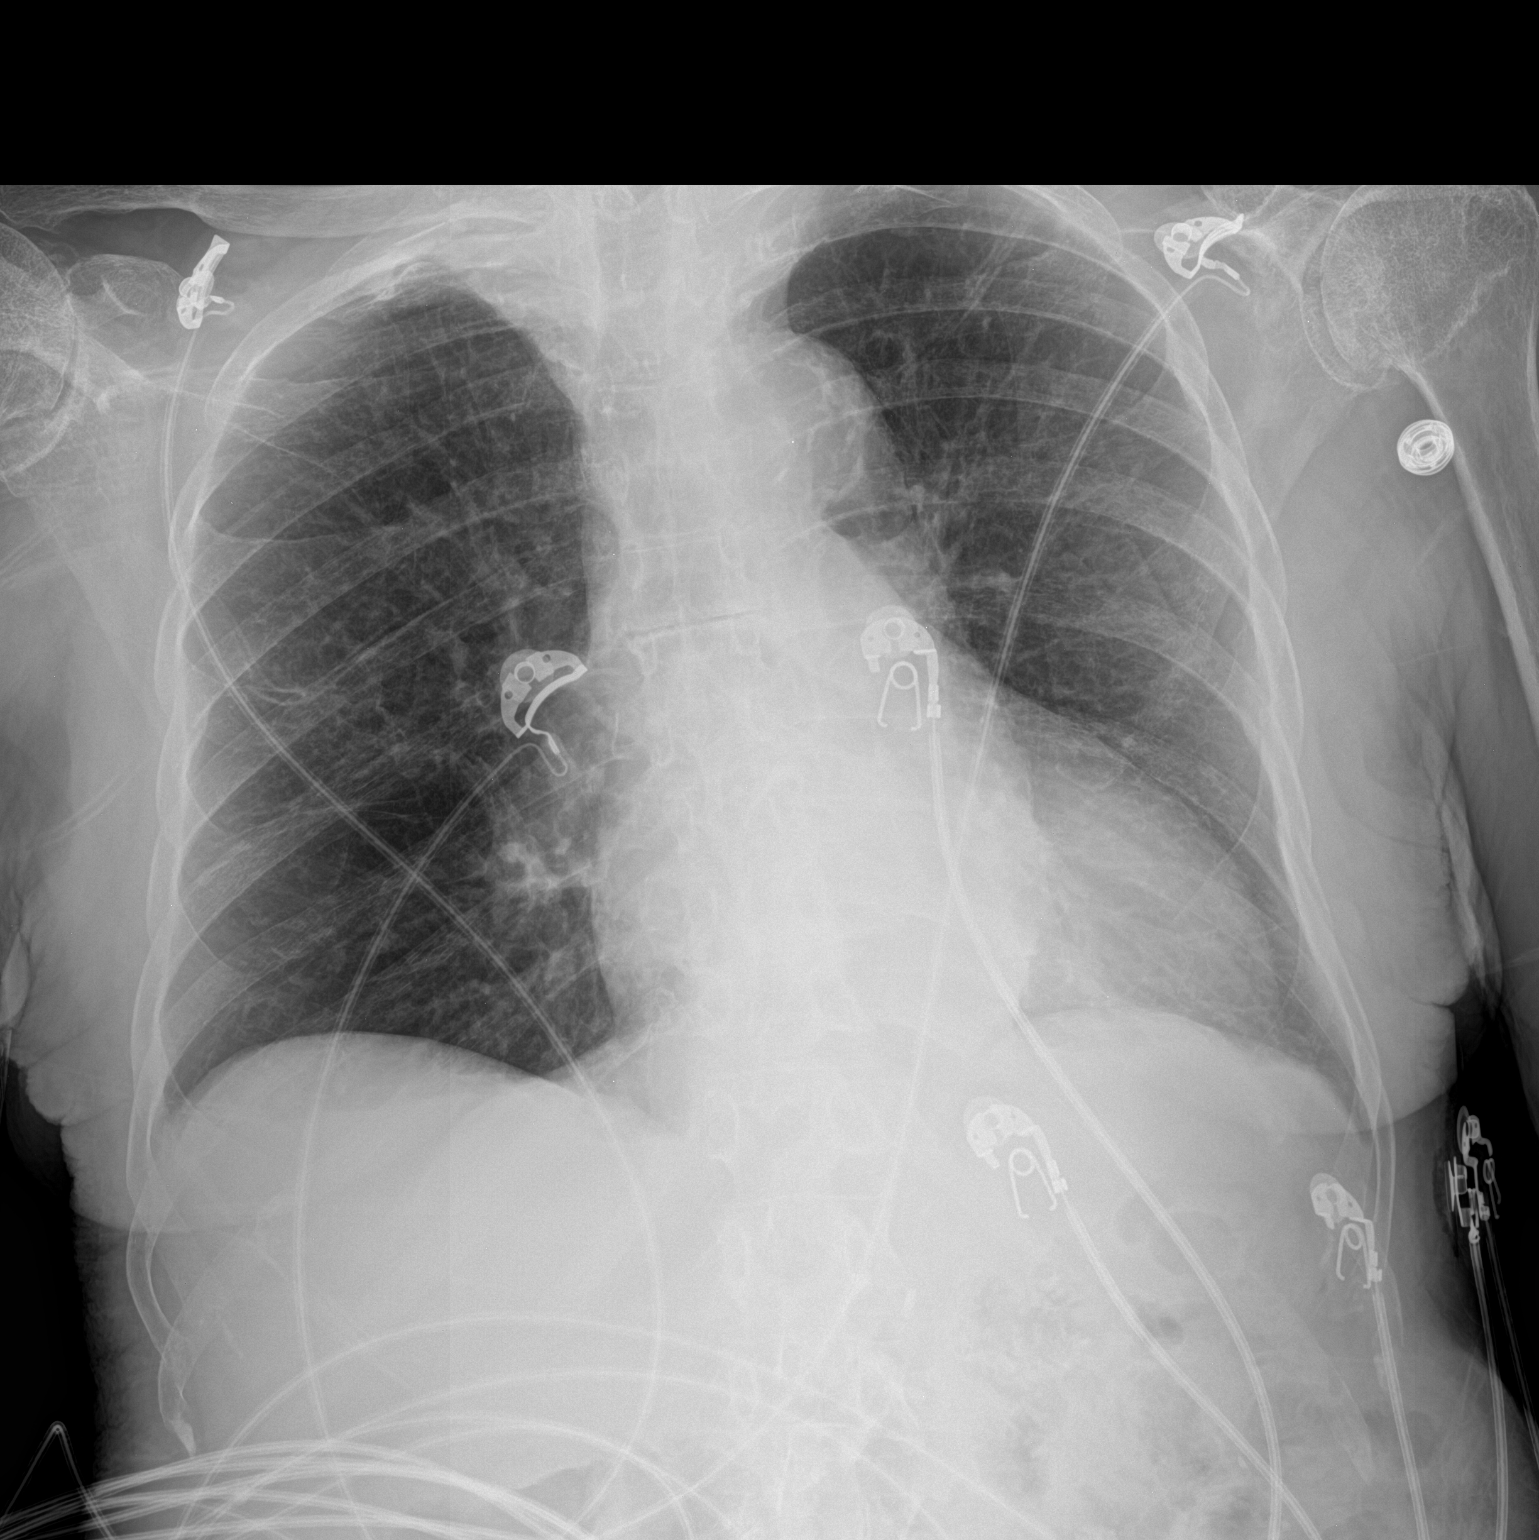

[1 of 1 positions shown; findings below may reference images not displayed]

FINDINGS: Cardiomediastinal silhouette is unchanged, the heart is enlarged.
The lungs and costophrenic angles are clear. There is no evidence
for pneumothorax. There is a healed left seventh rib fracture. There
is a healed right fifth rib fracture. No acute fractures are seen.
IMPRESSION: No active disease.

## 2022-08-30 IMAGING — CT CT CHEST W/O CM
2 of 4 series · 15 of 36 positions shown, 18 images · non-contrast
Comparison: 06/16/2021 and 01/02/2020.

CLINICAL DATA: Shortness of breath, difficulty breathing.

EXAM:
CT CHEST WITHOUT CONTRAST
TECHNIQUE: Multidetector CT imaging of the chest was performed following the
standard protocol without IV contrast.

[Series 2: thorax · axial · 0.63mm/px · z∈[+1286,+1536]mm · 12 of 147 slices shown, 15 images]
[im 11/147  mediastinal]
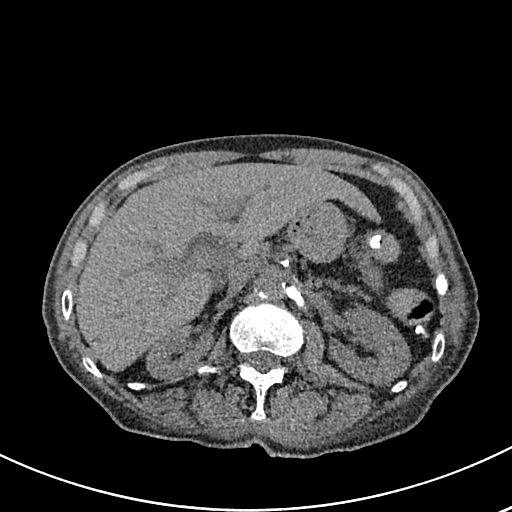
[im 11/147  lung]
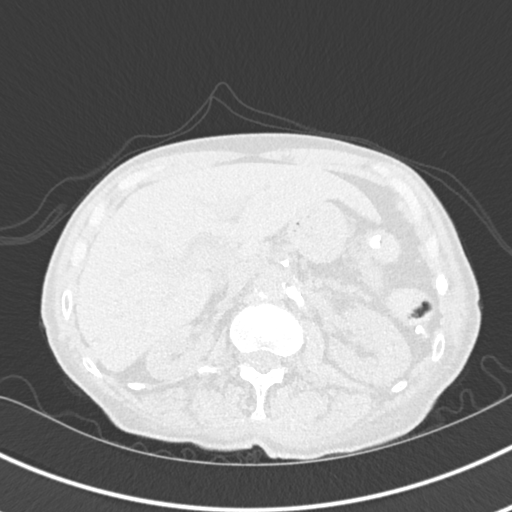
[im 21/147  lung]
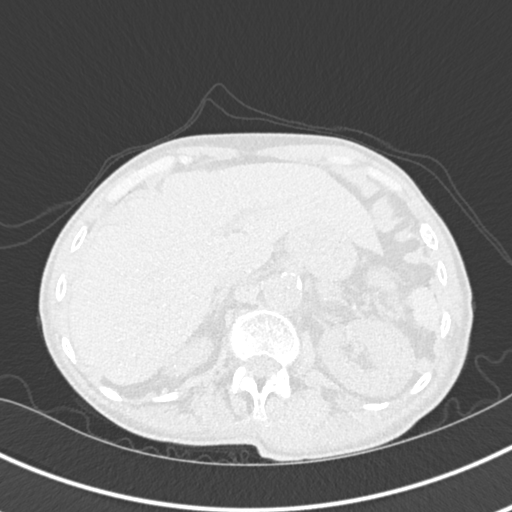
[im 32/147  lung]
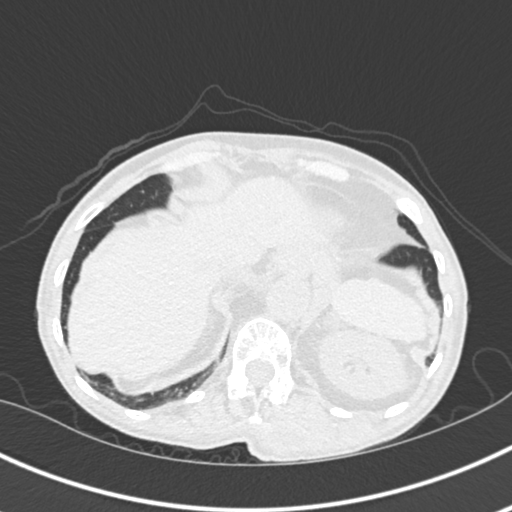
[im 42/147  lung]
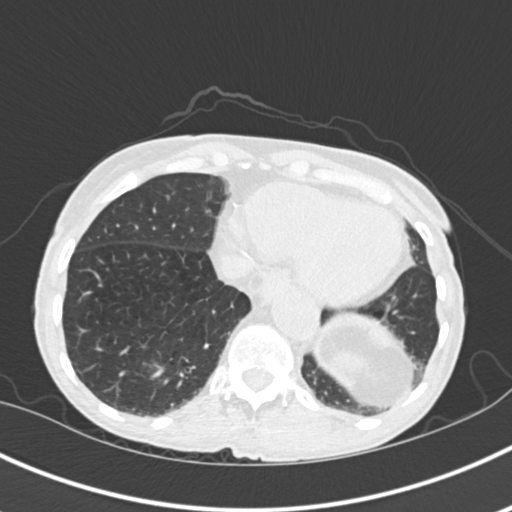
[im 53/147  mediastinal]
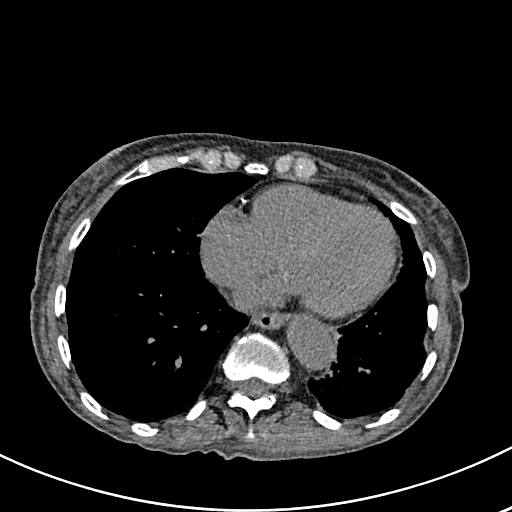
[im 53/147  lung]
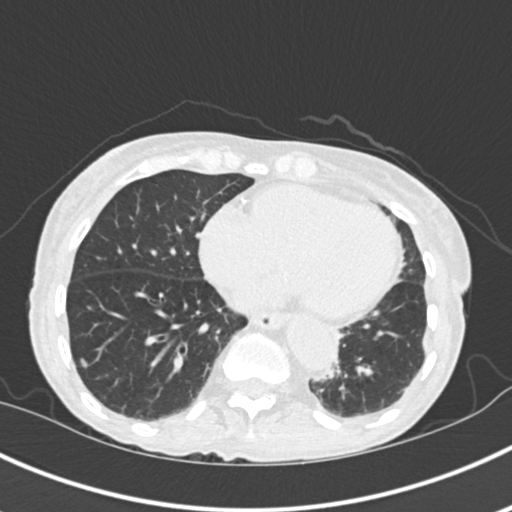
[im 63/147  lung]
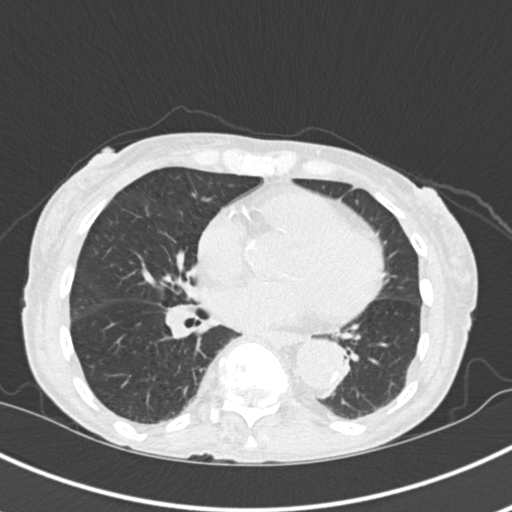
[im 84/147  lung]
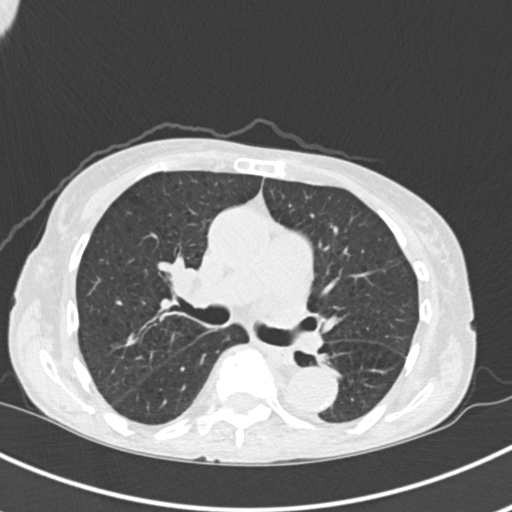
[im 94/147  lung]
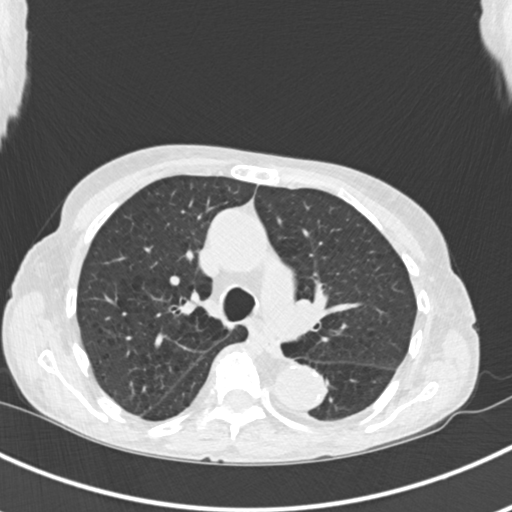
[im 105/147  mediastinal]
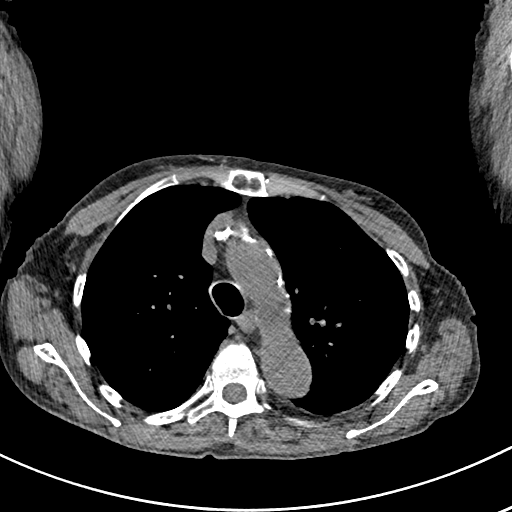
[im 105/147  lung]
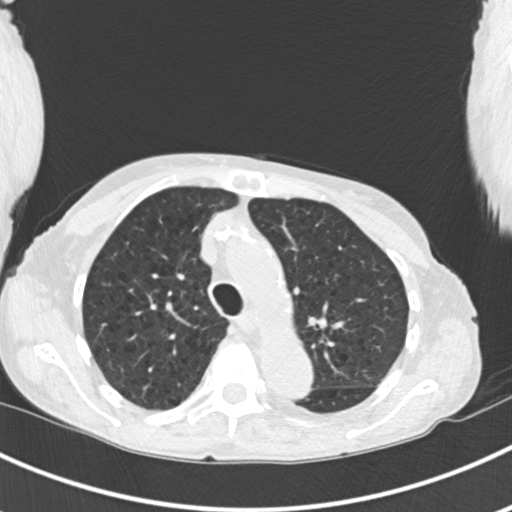
[im 115/147  lung]
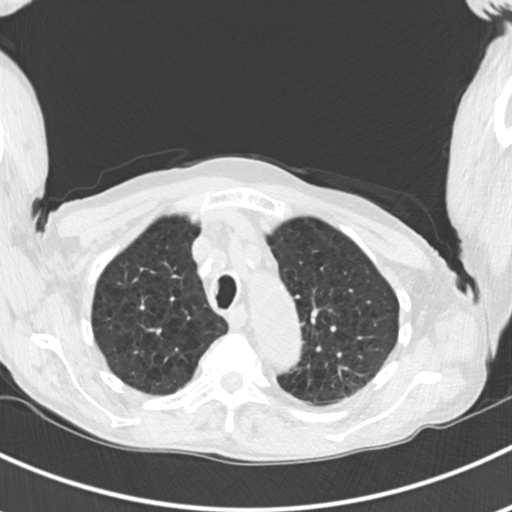
[im 126/147  lung]
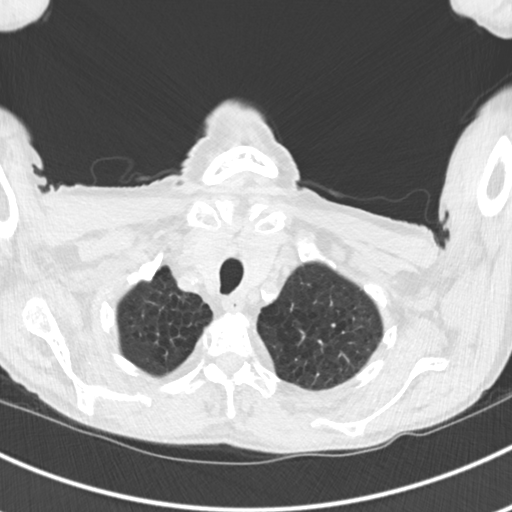
[im 136/147  lung]
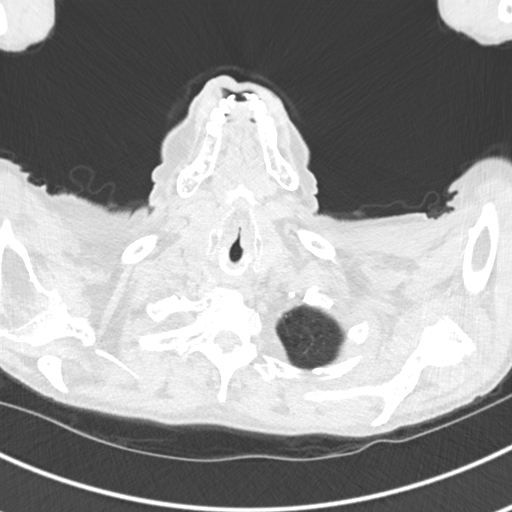

[Series 6: coronal · coronal · 0.66mm/px · 3 of 93 slices shown]
[im 19/93  lung]
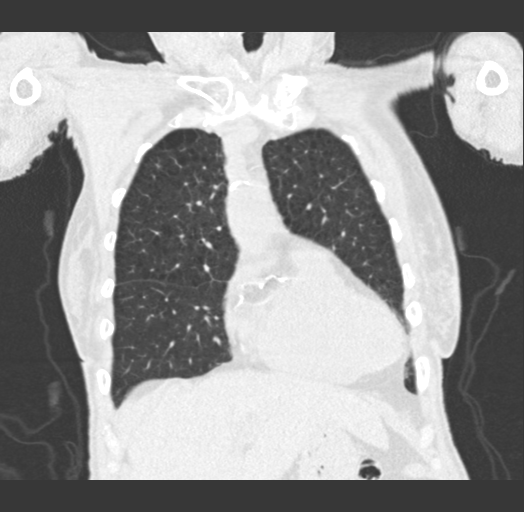
[im 37/93  lung]
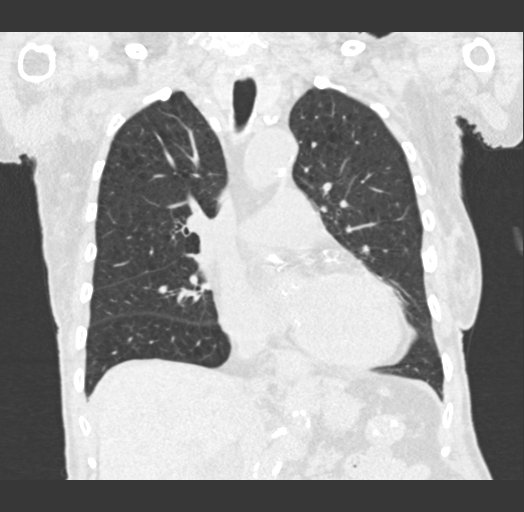
[im 56/93  lung]
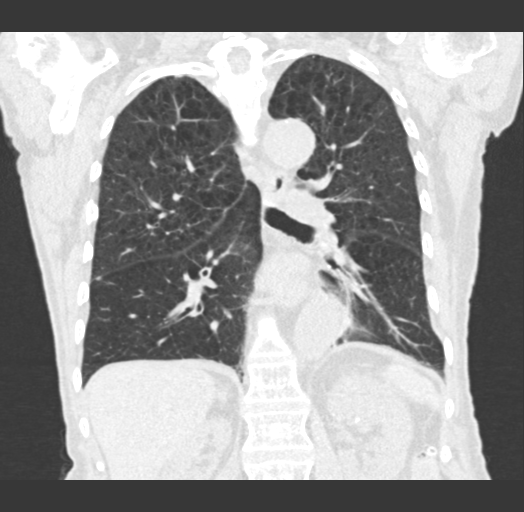

[15 of 36 positions shown; findings below may reference images not displayed]

FINDINGS: Cardiovascular: Atherosclerotic calcification of the aorta, aortic
valve and coronary arteries. Heart is enlarged. No pericardial
effusion.

Mediastinum/Nodes: No pathologically enlarged mediastinal or
axillary lymph nodes. Hilar regions are difficult to definitively
evaluate without IV contrast. Esophagus is grossly unremarkable.

Lungs/Pleura: Centrilobular emphysema. 5 mm oblong nodule in the
peripheral right lower lobe (5/92), unchanged from 01/02/2020 and
considered benign. Minimal residual periaortic atelectasis in the
left lower lobe. Previously seen left lower lobe collapse has
otherwise resolved in the interval. Mild basilar pleuroparenchymal
scarring, left greater than right. No suspicious pulmonary nodules.
No pleural fluid. Debris is seen in the airway.

Upper Abdomen: Visualized portions of the liver, gallbladder,
adrenal glands unremarkable. Right kidney is somewhat atrophic.
Low-attenuation lesions in the left kidney measure up to 1.4 cm and
are difficult to further characterize due to size and lack of
postcontrast imaging. Visualized portions of the spleen, pancreas,
stomach and bowel are grossly unremarkable.

Musculoskeletal: Degenerative changes in the spine and shoulders.
Benign-appearing cortical thickening along the first ribs
bilaterally. Old left rib fractures. No worrisome lytic or sclerotic
lesions.
IMPRESSION: 1. Minimal residual periaortic atelectasis in the medial left lower
lobe. Left lower lobe collapse has otherwise resolved.
2. Emphysema ([id number]). No additional findings to explain the
patient's shortness of breath.
3. Aortic atherosclerosis ([id number]). Coronary artery
calcification.

## 2022-09-02 IMAGING — CR DG CHEST 2V
2 series · 2 of 2 positions shown · non-contrast
Comparison: 07/18/2021

CLINICAL DATA: Chest pain, hypertension, smoker

EXAM:
CHEST - 2 VIEW

[chest lat]
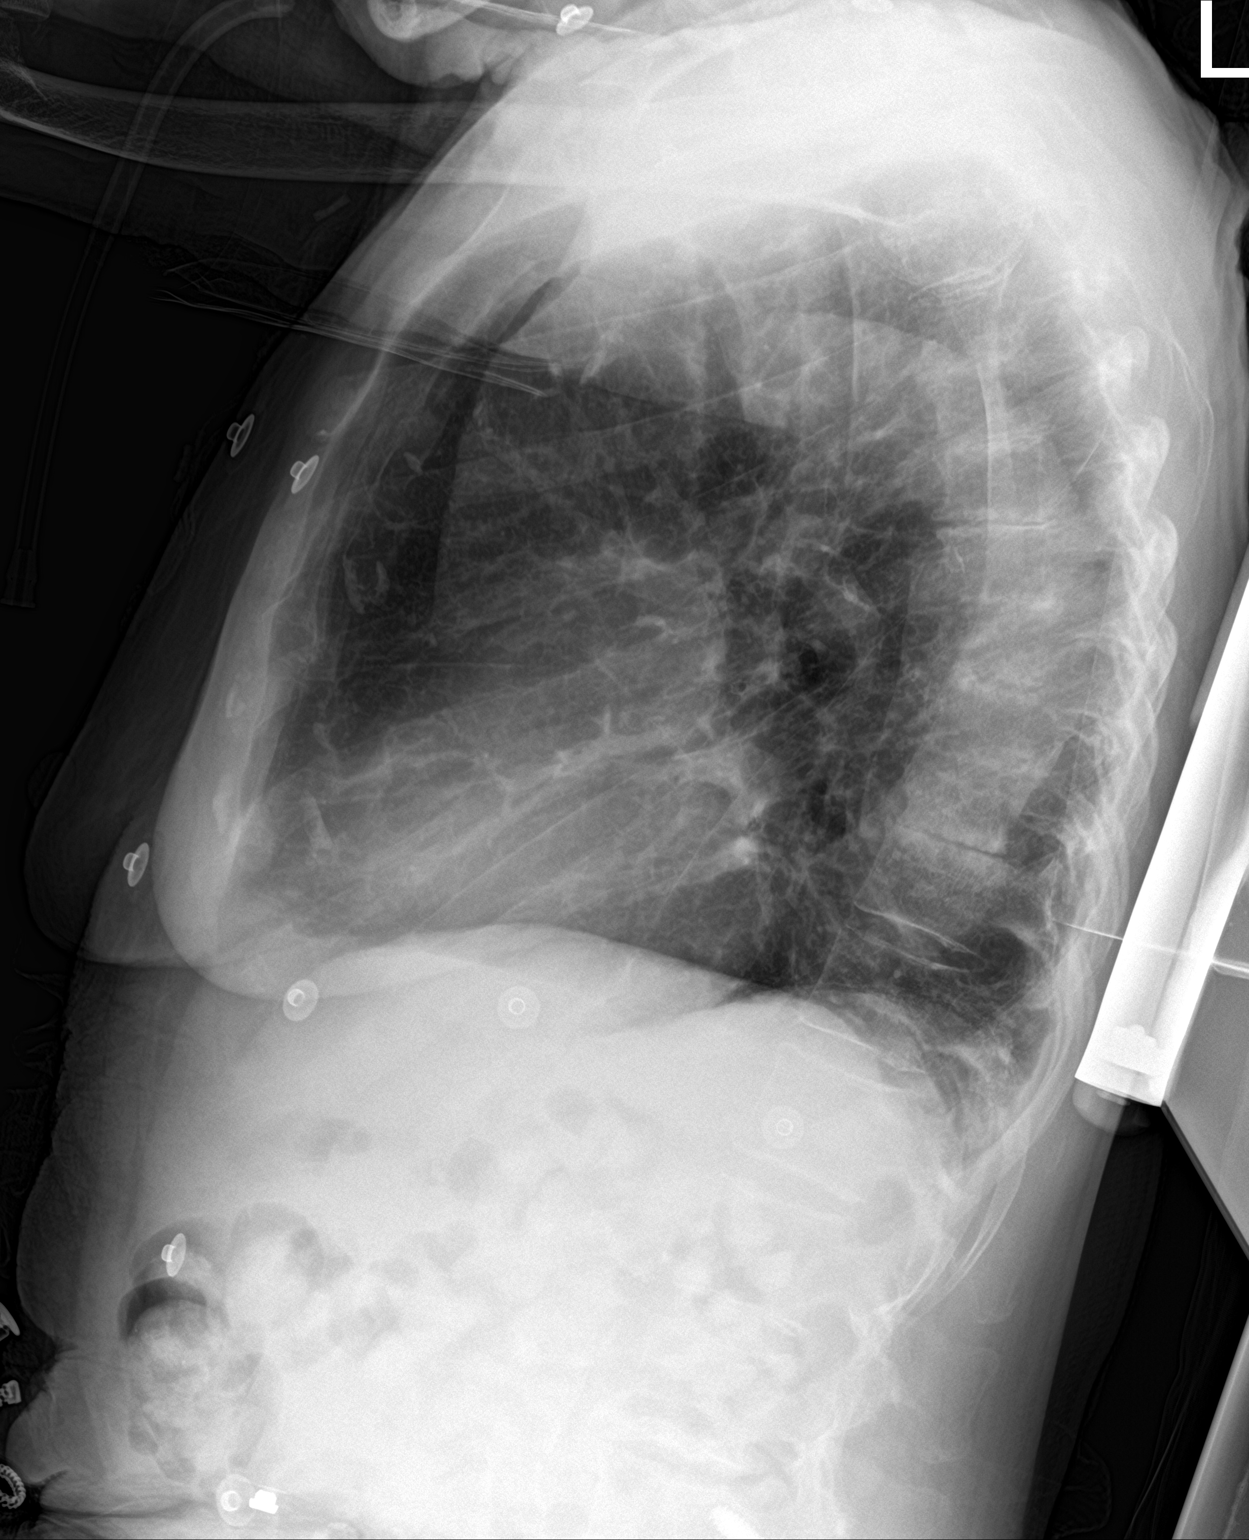

[chest ap]
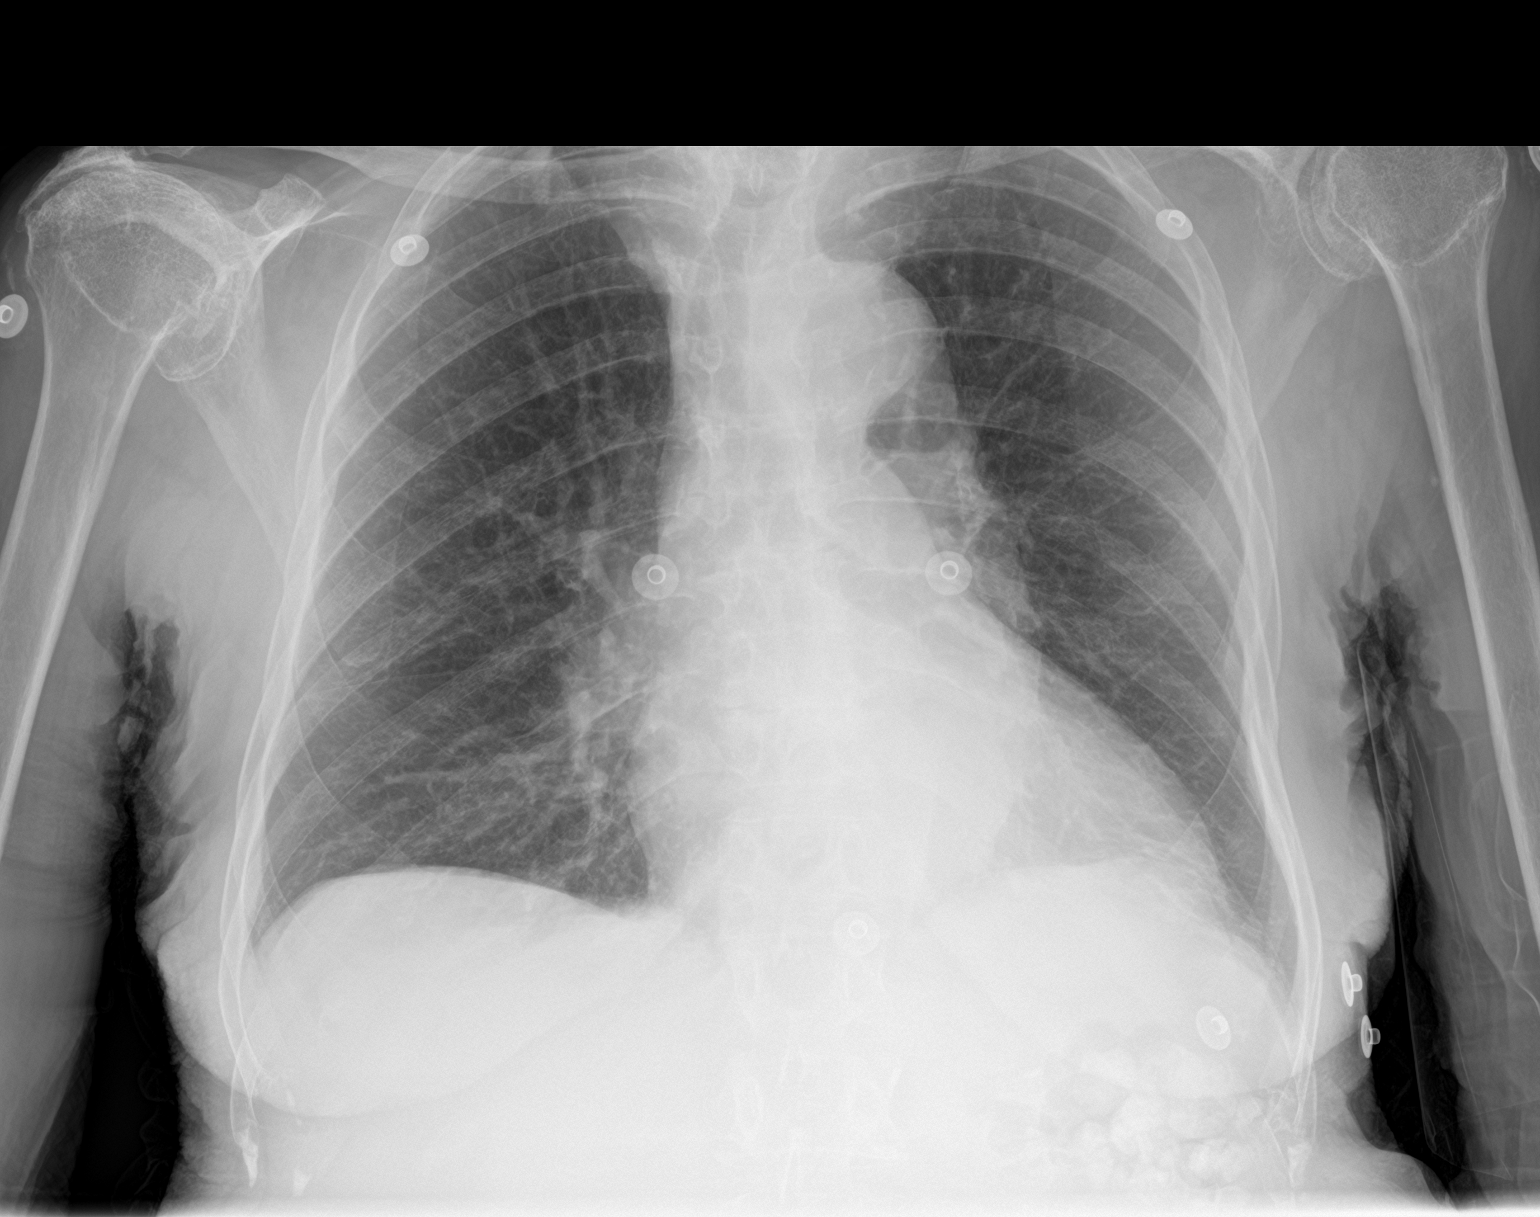

[2 of 2 positions shown; findings below may reference images not displayed]

FINDINGS: Enlargement of cardiac silhouette.

Atherosclerotic calcification of a tortuous thoracic aorta.

Mediastinal contours and pulmonary vascularity otherwise normal.

Minimal subsegmental atelectasis at both lung bases.

No pulmonary infiltrate, pleural effusion, or pneumothorax.

Bones demineralized with chronic RIGHT rotator cuff tear.
IMPRESSION: Enlargement of cardiac silhouette with minimal bibasilar
atelectasis.

## 2022-09-02 IMAGING — DX DG HIP (WITH OR WITHOUT PELVIS) 2-3V*L*
4 series · 4 of 4 positions shown · non-contrast
Comparison: 09/17/2016

CLINICAL DATA: Left hip pain, extensive postsurgical changes right
hip

EXAM:
DG HIP (WITH OR WITHOUT PELVIS) 2-3V LEFT

[pelvis ap (1 of 2)]
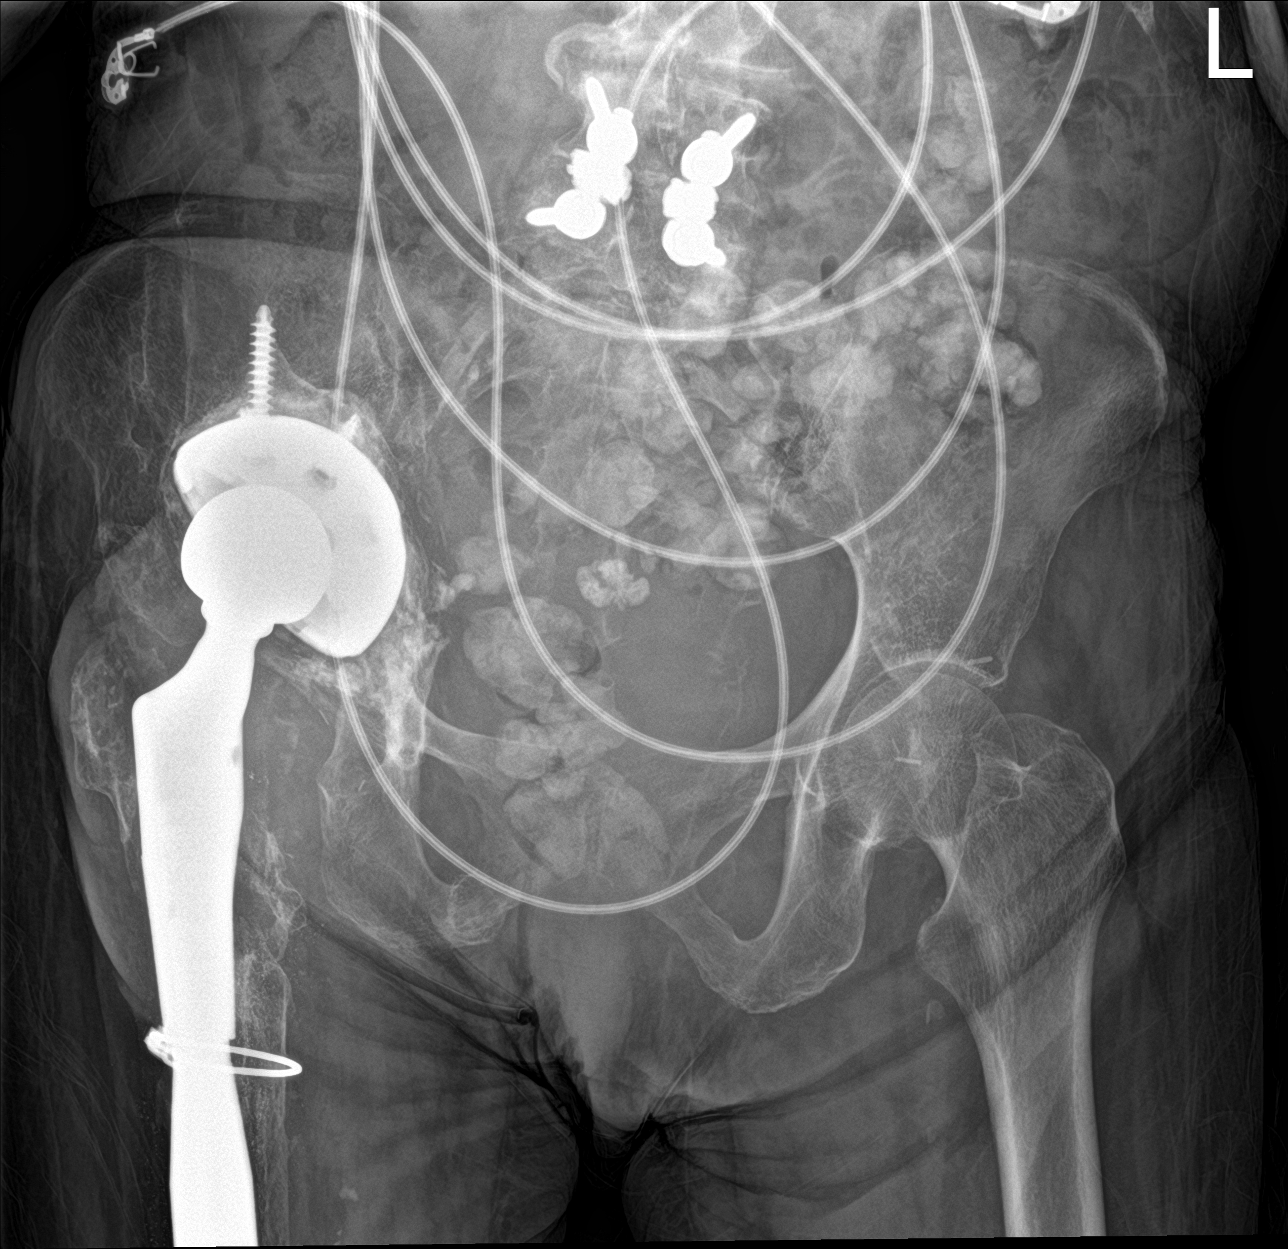

[hip ap]
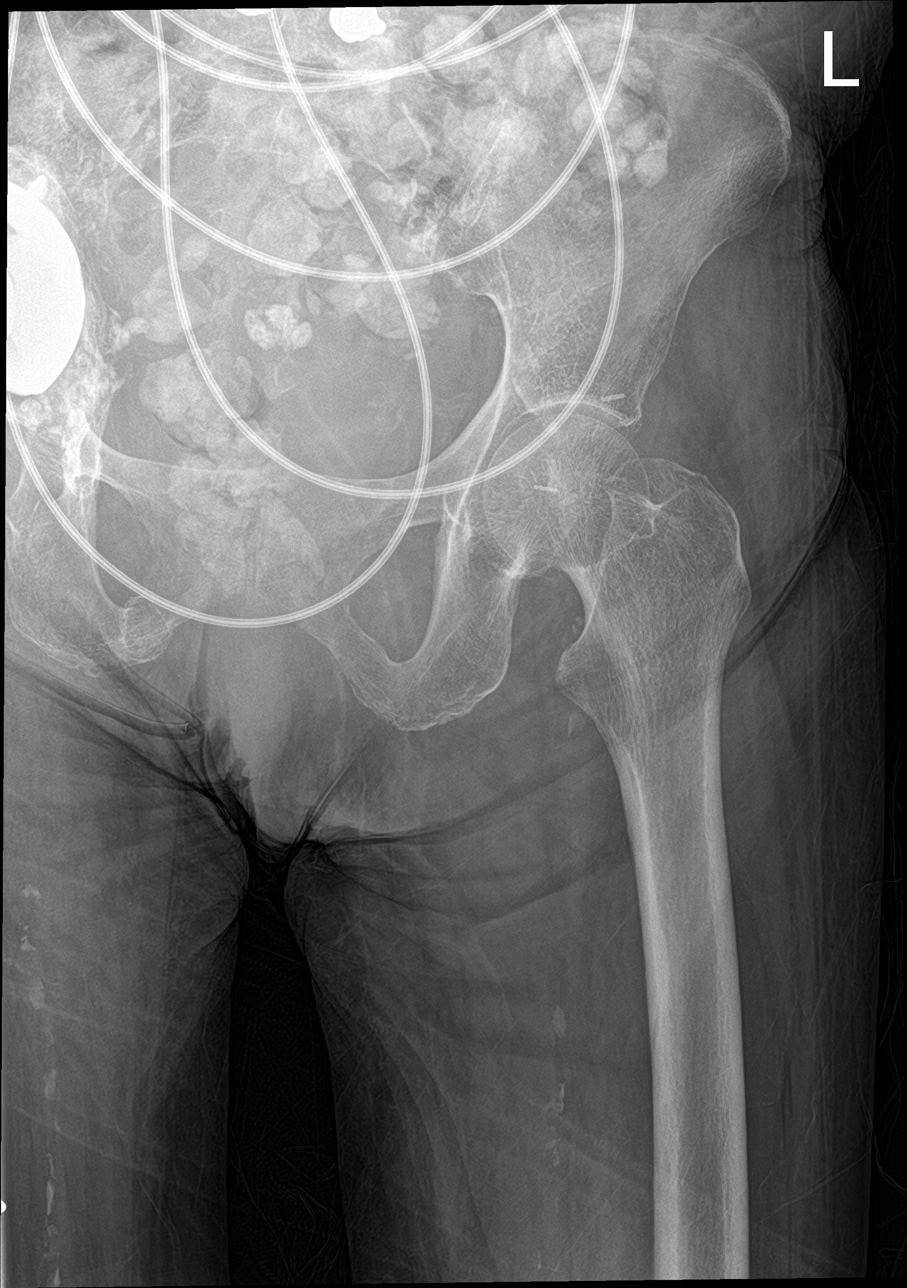

[hip lat]
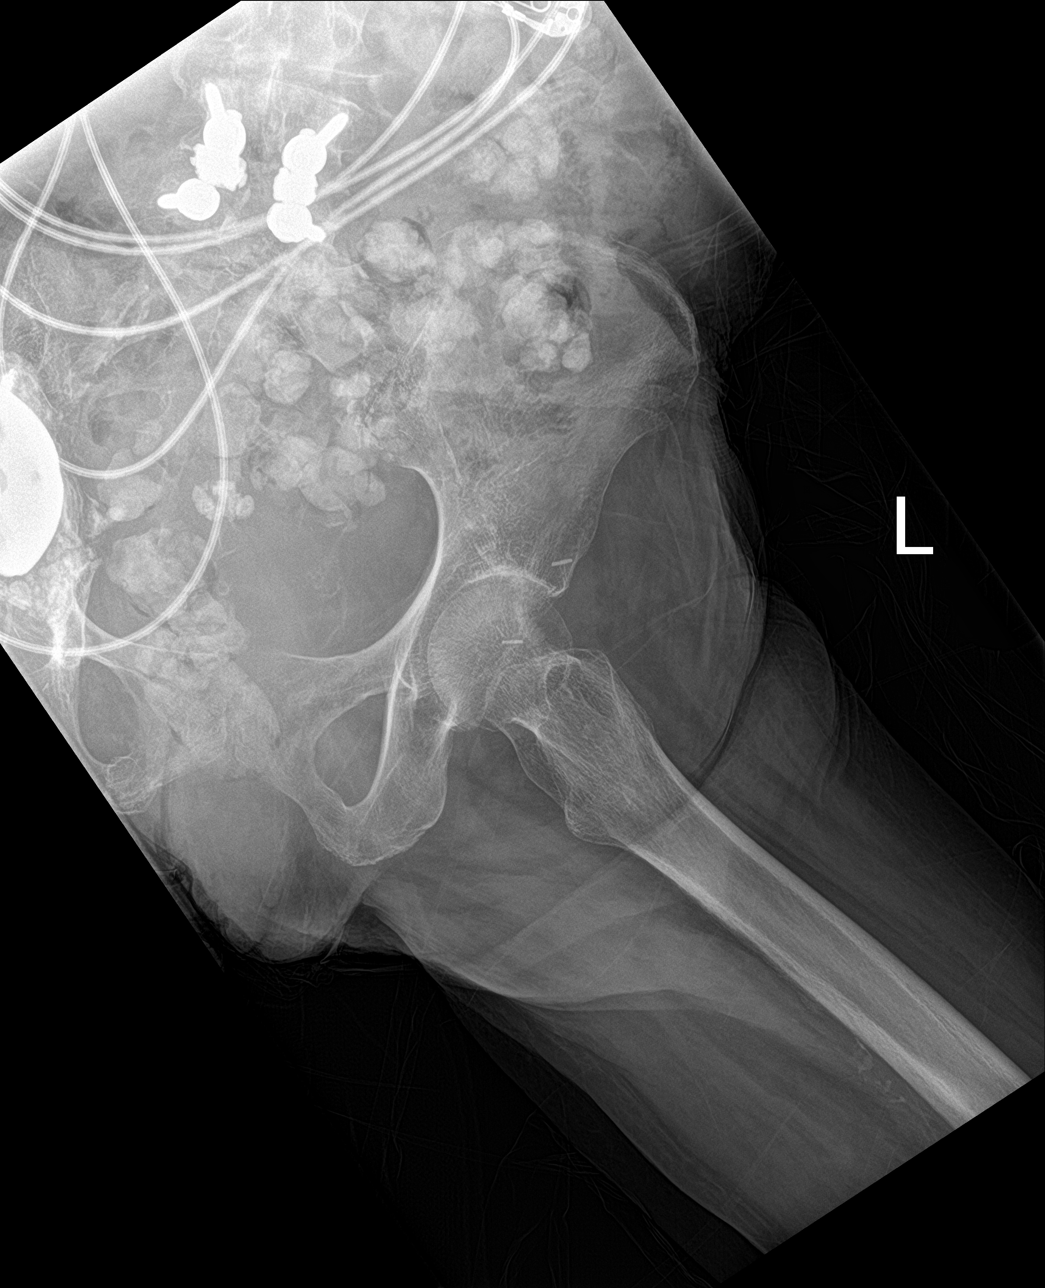

[pelvis ap (2 of 2)]
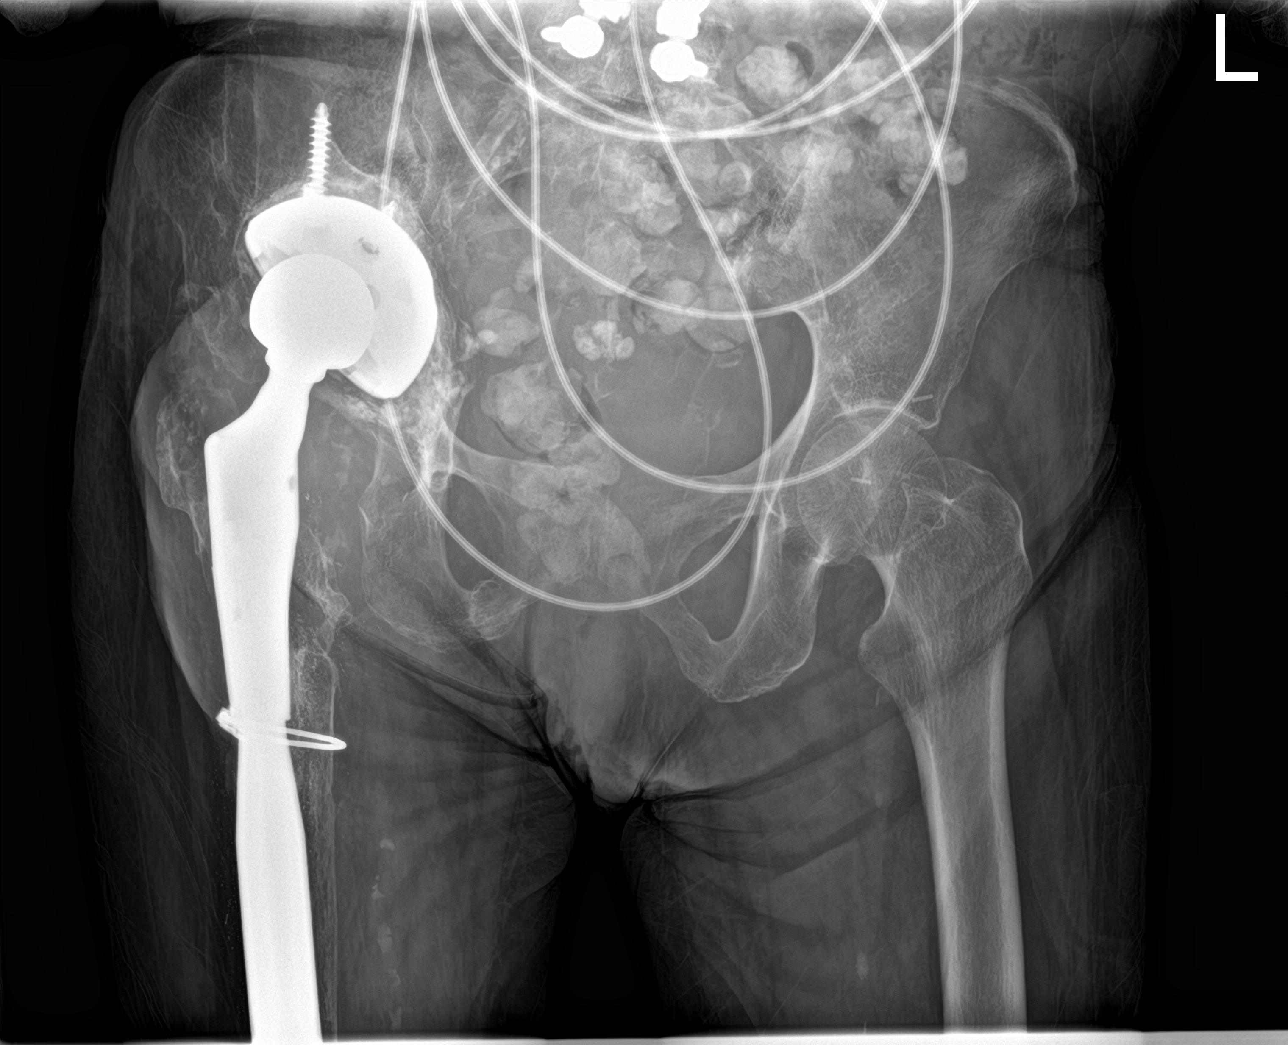

[4 of 4 positions shown; findings below may reference images not displayed]

FINDINGS: Frontal view of the pelvis as well as frontal and frogleg lateral
views of the left hip are obtained. There are extensive postsurgical
changes of the right hip with right hip arthroplasty and extensive
heterotopic ossification again noted. There are no acute displaced
fractures. Mild left hip osteoarthritis unchanged. Stable
postsurgical changes lower lumbar spine.
IMPRESSION: 1. Mild left hip osteoarthritis.  No acute fracture.
2. Right hip arthroplasty with extensive surrounding postsurgical
change.

## 2022-09-15 IMAGING — CT CT HEAD W/O CM
3 series · 15 of 47 positions shown, 18 images · non-contrast
Comparison: CT head 07/18/2021

CLINICAL DATA: Cerebral hemorrhage suspected.

EXAM:
CT HEAD WITHOUT CONTRAST
TECHNIQUE: Contiguous axial images were obtained from the base of the skull
through the vertex without intravenous contrast.

[Series 2: head wo · axial · 0.43mm/px · z∈[-126,-1]mm · 9 of 30 slices shown, 12 images]
[im 3/30  brain]
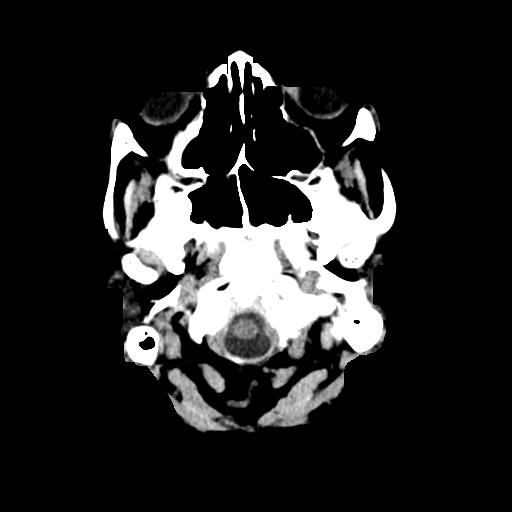
[im 3/30  bone]
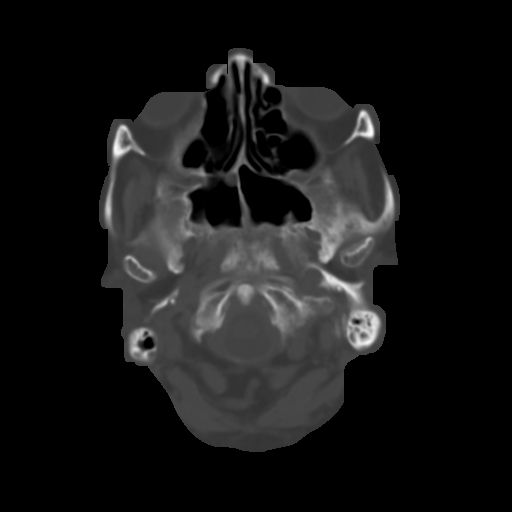
[im 6/30  brain]
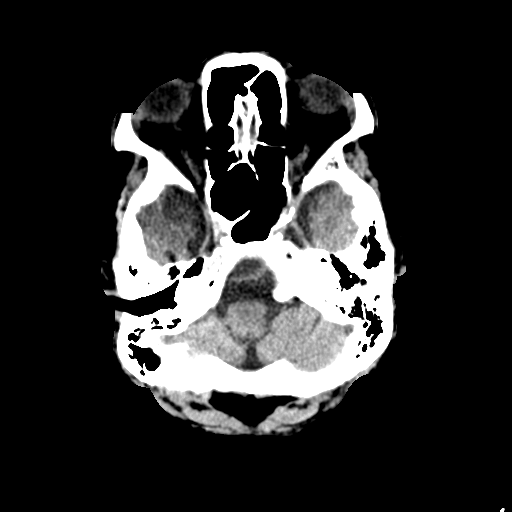
[im 9/30  brain]
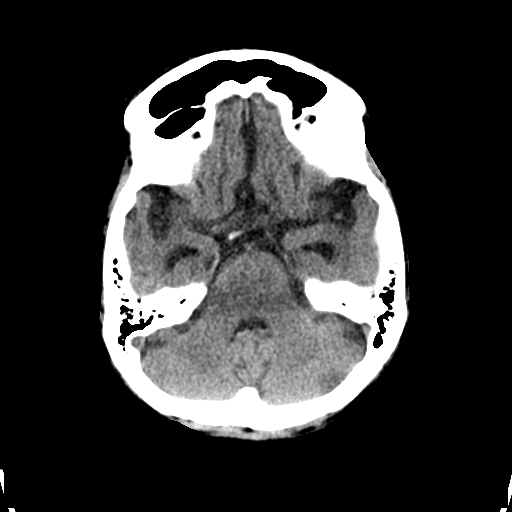
[im 12/30  brain]
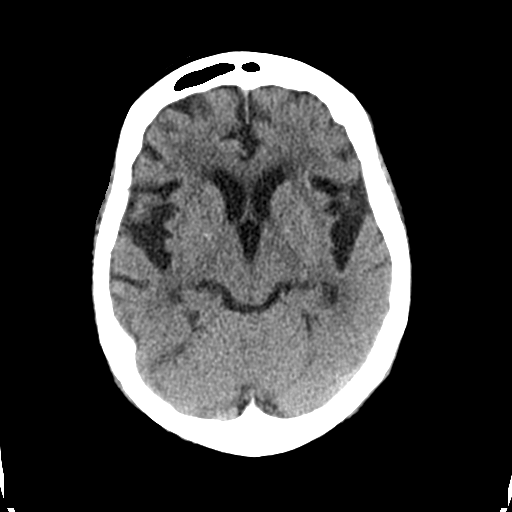
[im 16/30  brain]
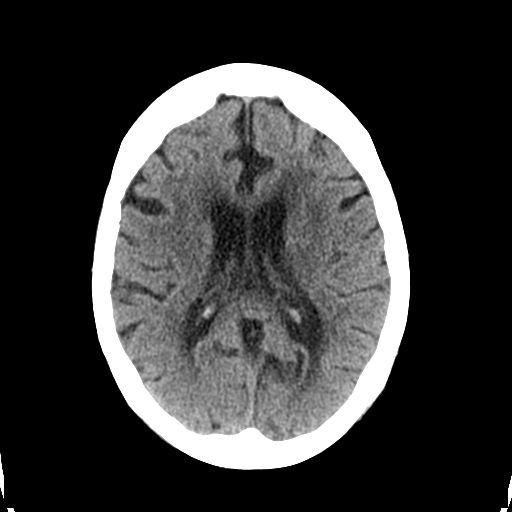
[im 16/30  bone]
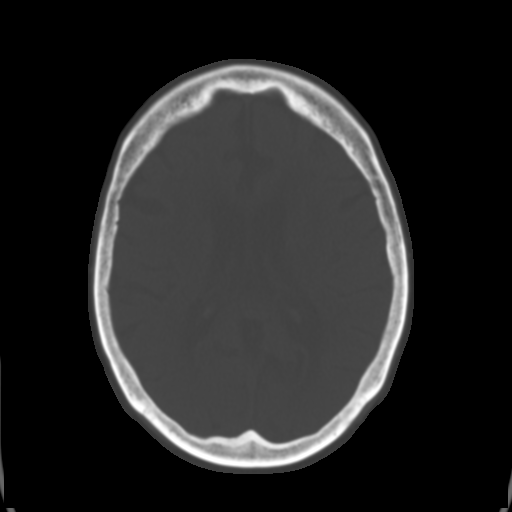
[im 19/30  brain]
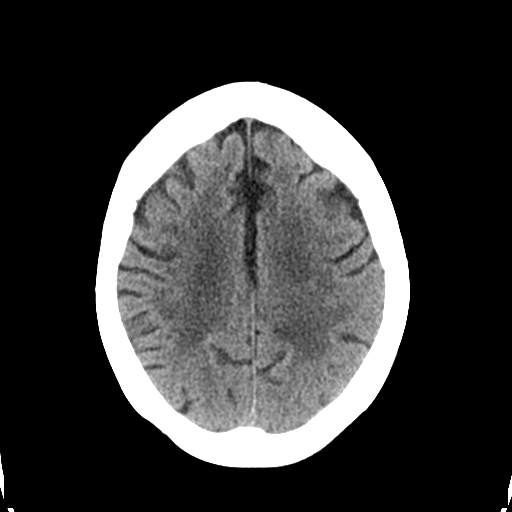
[im 22/30  brain]
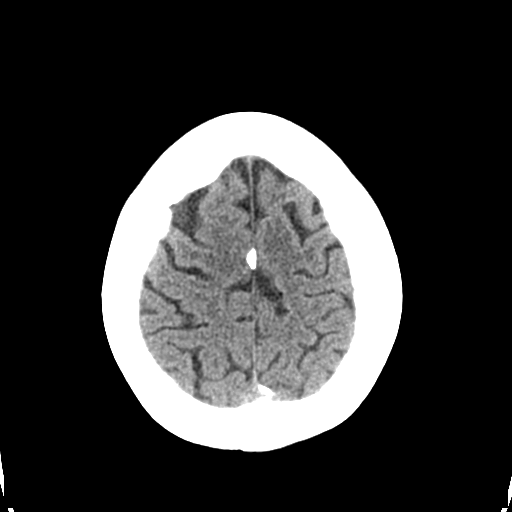
[im 25/30  brain]
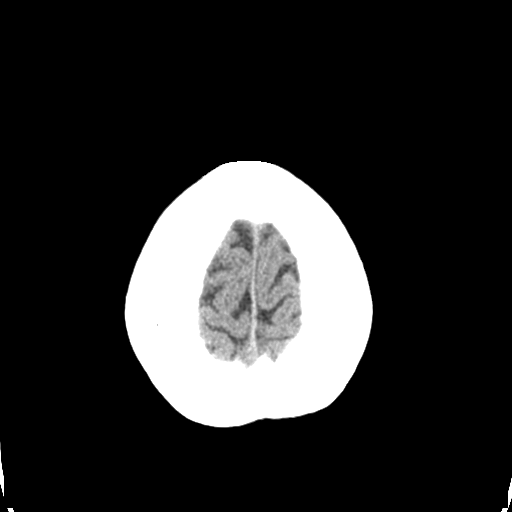
[im 28/30  brain]
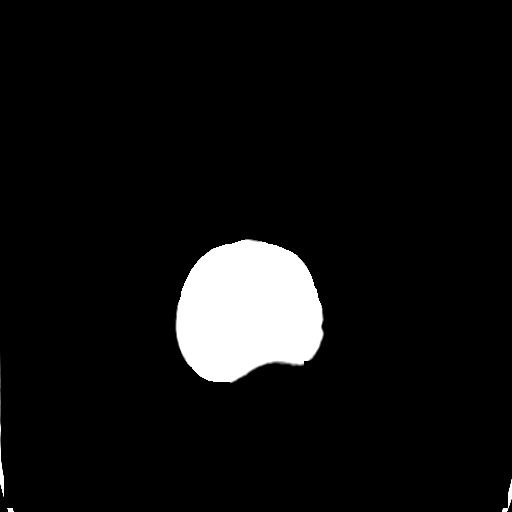
[im 28/30  bone]
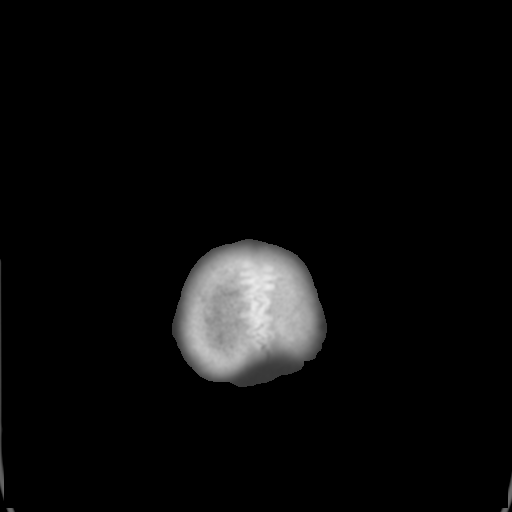

[Series 5: coronal soft tissue · coronal · 0.29mm/px · 3 of 71 slices shown]
[im 24/71  brain]
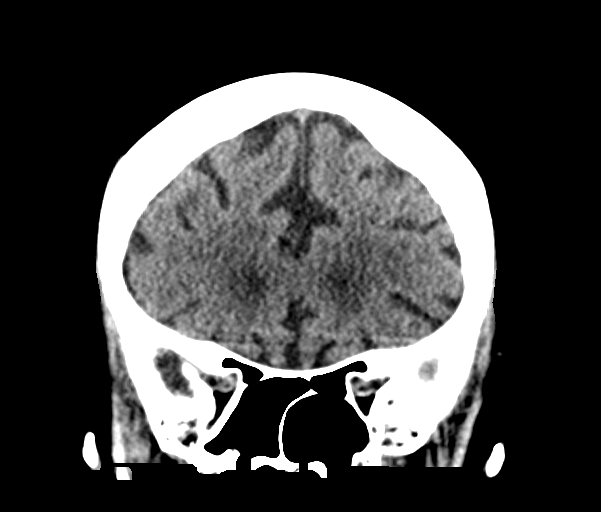
[im 32/71  brain]
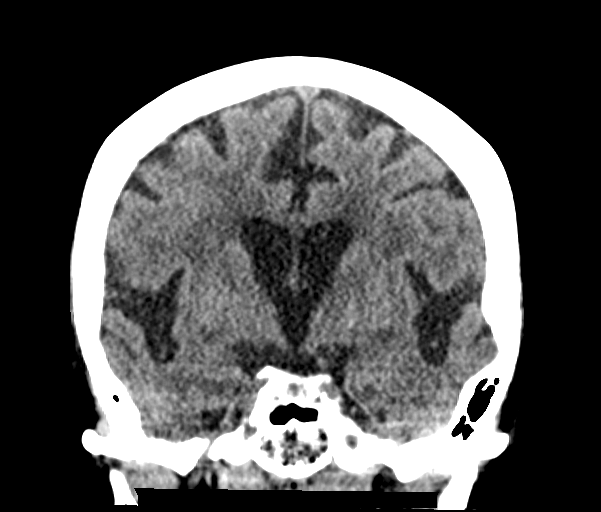
[im 39/71  brain]
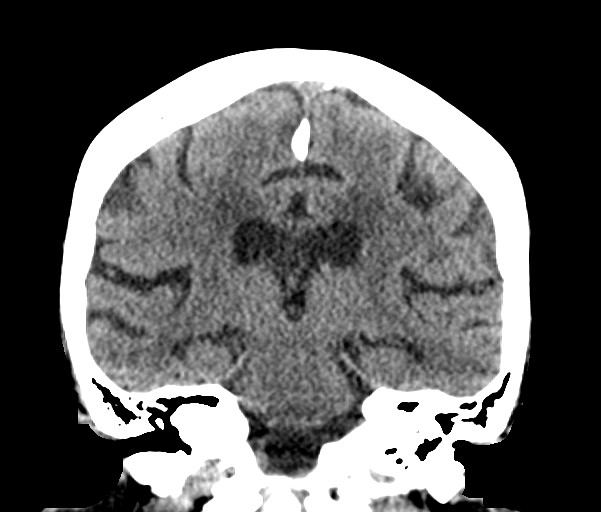

[Series 6: sagittal soft tissue · sagittal · 0.32mm/px · 3 of 55 slices shown]
[im 19/55  brain]
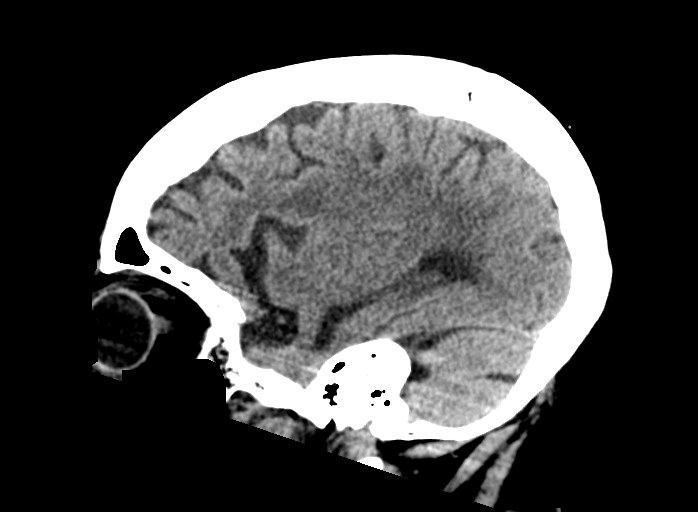
[im 28/55  brain]
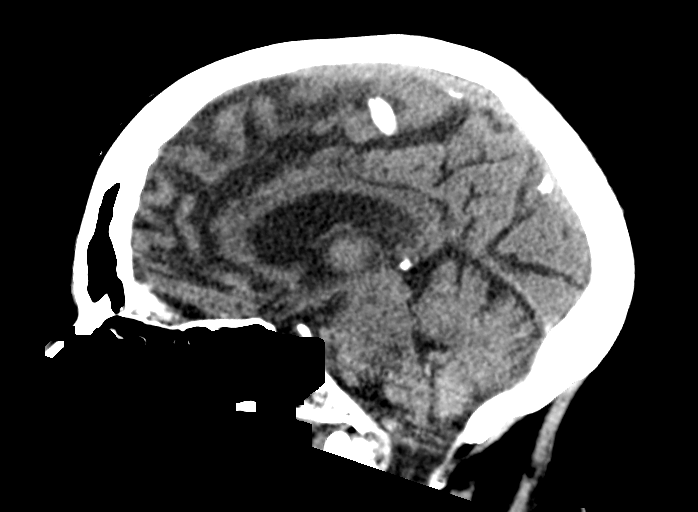
[im 37/55  brain]
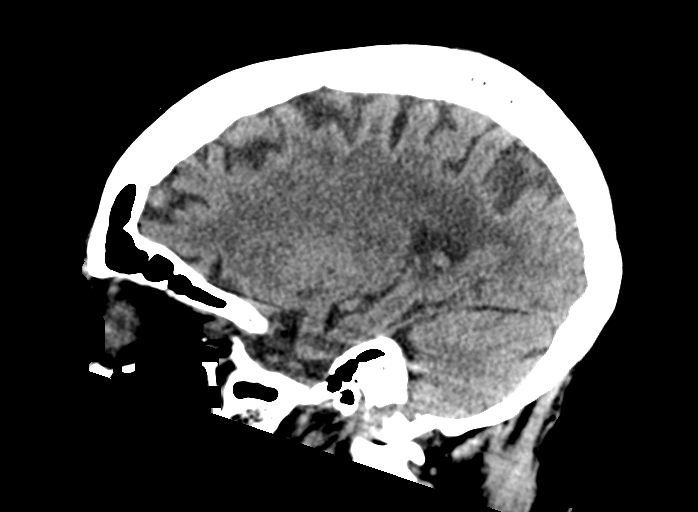

[15 of 47 positions shown; findings below may reference images not displayed]

FINDINGS: Brain: Generalized atrophy. Chronic microvascular ischemic changes
in the white matter.

Negative for acute infarct, hemorrhage, mass.

Vascular: Negative for hyperdense vessel

Skull: Negative

Sinuses/Orbits: Mucosal edema right maxillary sinus. Remaining
sinuses clear. Bilateral cataract extraction

Other: None
IMPRESSION: Moderate atrophy.  Chronic microvascular ischemic change

No acute abnormality no change from the recent study.

## 2022-10-24 IMAGING — CT CT ABD-PELV W/O CM
2 of 4 series · 16 of 46 positions shown, 18 images · non-contrast
Comparison: 06/16/2021

CLINICAL DATA: Abdominal pain.

EXAM:
CT ABDOMEN AND PELVIS WITHOUT CONTRAST
TECHNIQUE: Multidetector CT imaging of the abdomen and pelvis was performed
following the standard protocol without IV contrast.

[Series 3: a/p w/o 5mm (person_name) · axial · non-contrast · 0.81mm/px · z∈[-226,+149]mm · 13 of 83 slices shown, 15 images]
[im 4/83  soft-tissue]
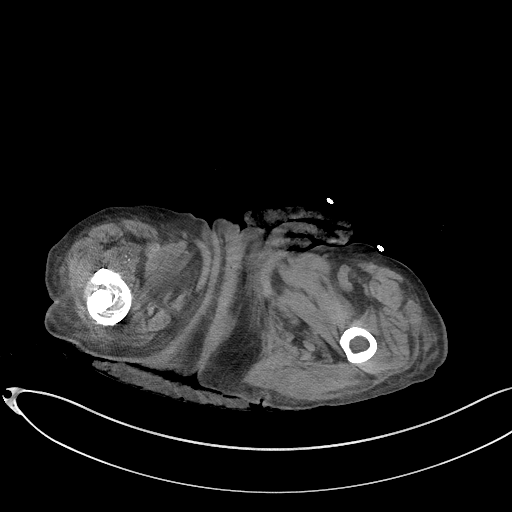
[im 4/83  bone]
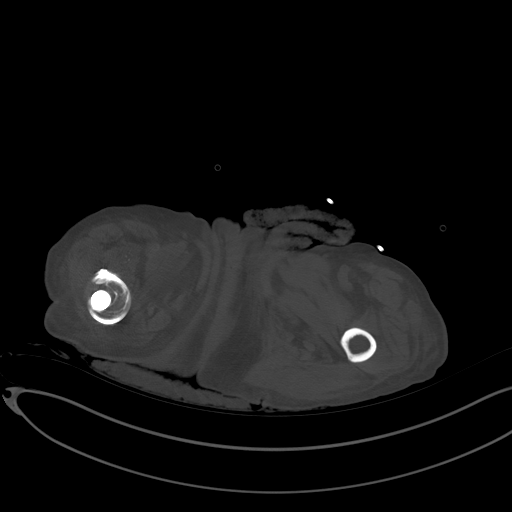
[im 10/83  soft-tissue]
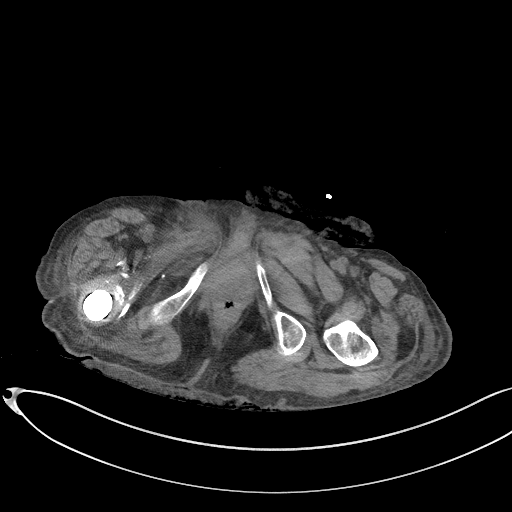
[im 16/83  soft-tissue]
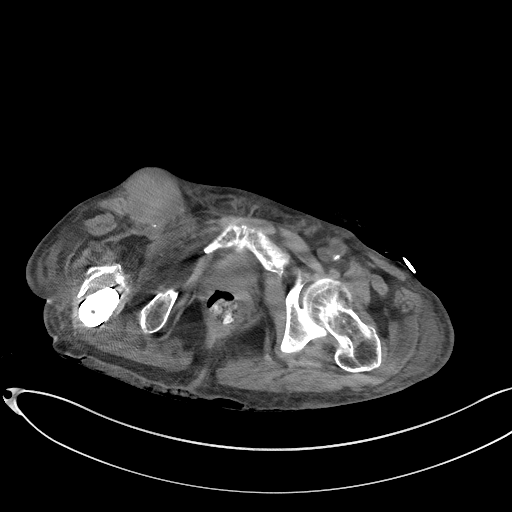
[im 23/83  soft-tissue]
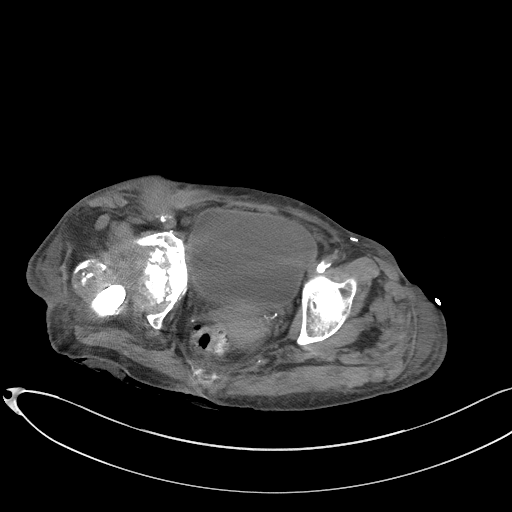
[im 29/83  soft-tissue]
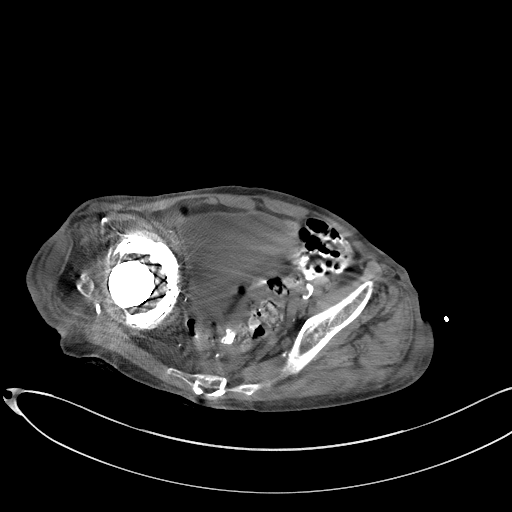
[im 35/83  soft-tissue]
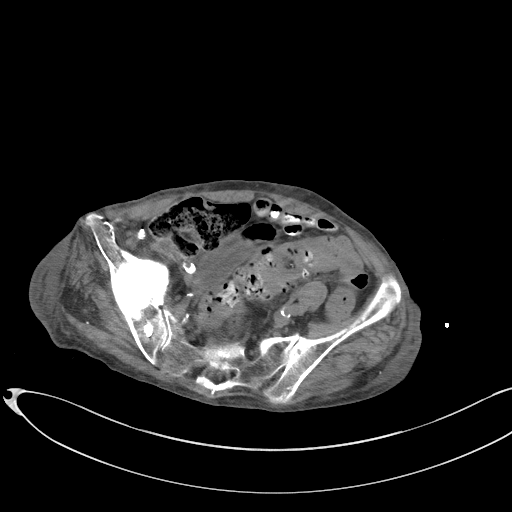
[im 42/83  soft-tissue]
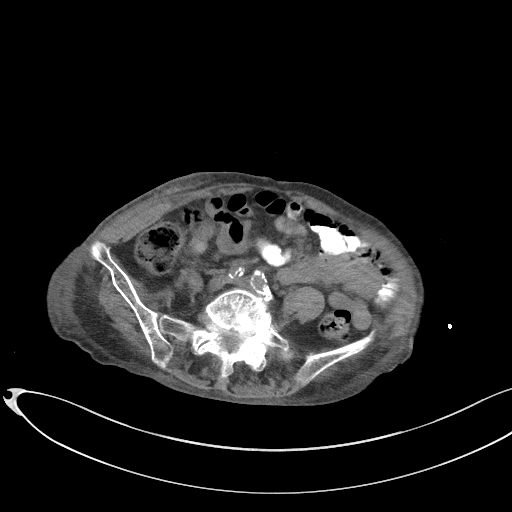
[im 48/83  soft-tissue]
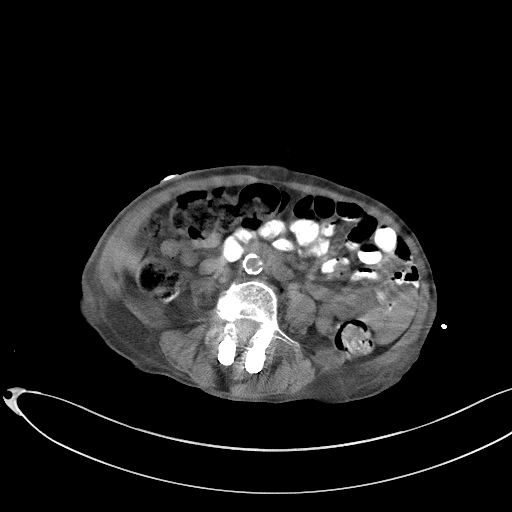
[im 54/83  soft-tissue]
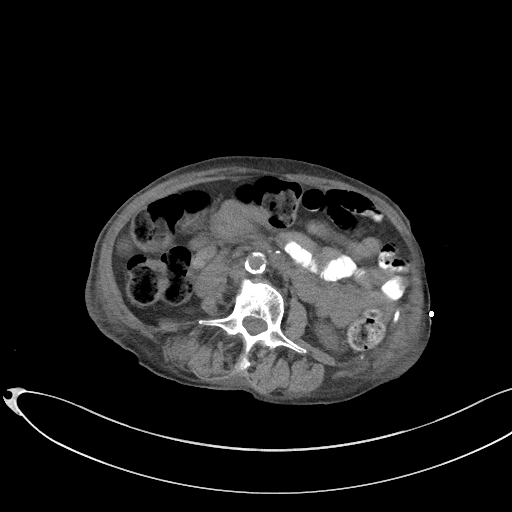
[im 54/83  bone]
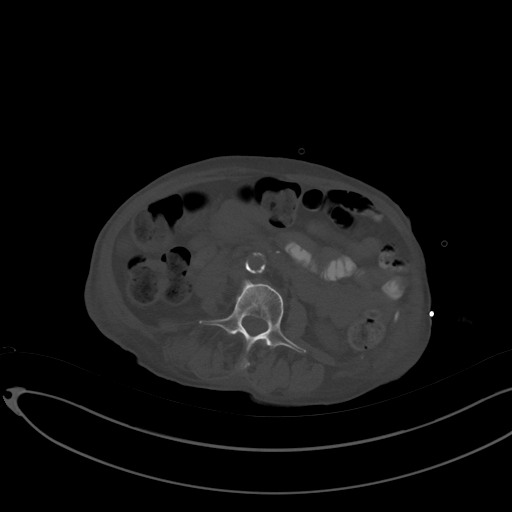
[im 60/83  soft-tissue]
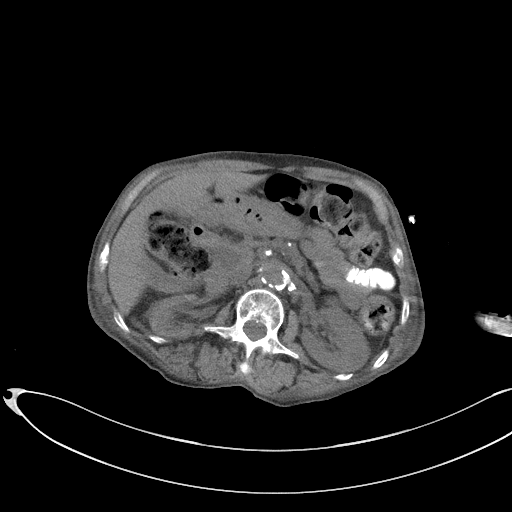
[im 67/83  soft-tissue]
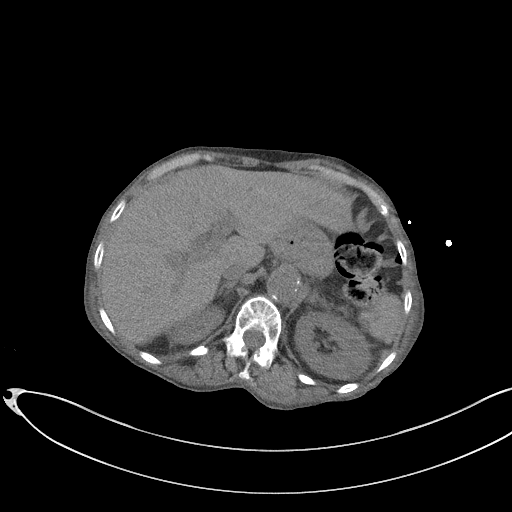
[im 73/83  soft-tissue]
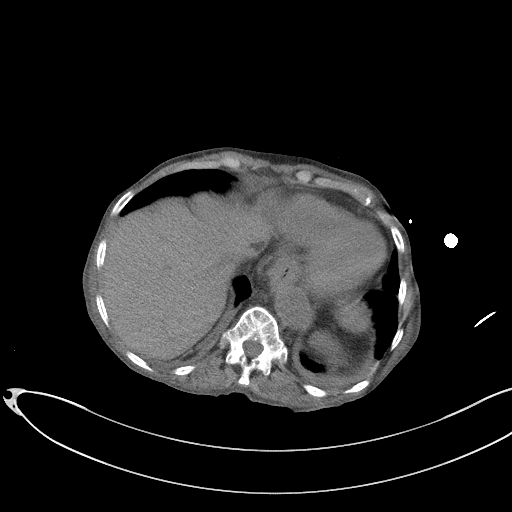
[im 79/83  soft-tissue]
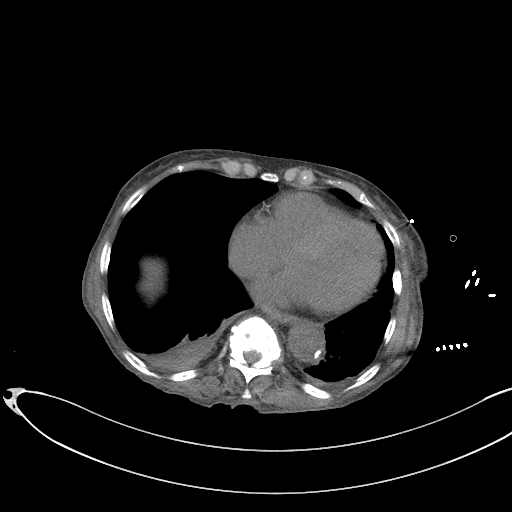

[Series 6: a/p w/o cor · coronal · non-contrast · 0.86mm/px · 3 of 109 slices shown]
[im 37/109  soft-tissue]
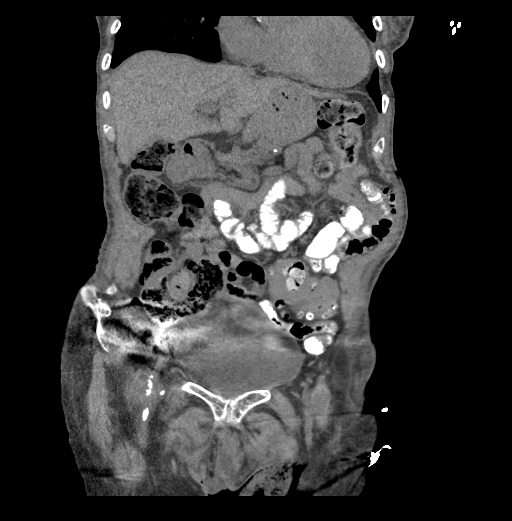
[im 49/109  soft-tissue]
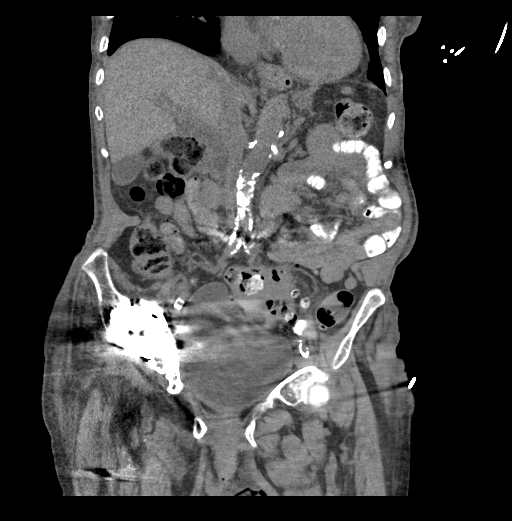
[im 61/109  soft-tissue]
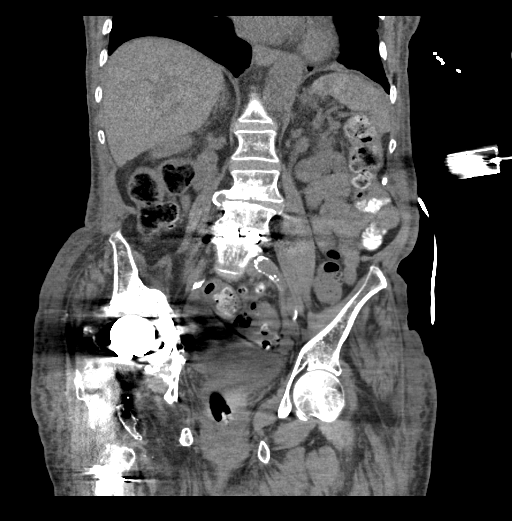

[16 of 46 positions shown; findings below may reference images not displayed]

FINDINGS: Lower chest: Bibasilar collapse/consolidation, left greater than
right. The left lower lobe collapse seen previously has decreased in
the interval.

Hepatobiliary: No focal abnormality in the liver on this study
without intravenous contrast. There is no evidence for gallstones,
gallbladder wall thickening, or pericholecystic fluid. Dilatation of
the common bile duct again noted measuring up to 10 mm in the head
of the pancreas, similar to prior.

Pancreas: Diffuse parenchymal atrophy without appreciable main duct
dilatation.

Spleen: No splenomegaly. No focal mass lesion.

Adrenals/Urinary Tract: No adrenal nodule or mass. Right kidney
atrophic. No hydronephrosis. 14 mm low-density lesion upper
interpolar left kidney is stable, likely a cyst. No evidence for
hydroureter. The urinary bladder appears normal for the degree of
distention.

Stomach/Bowel: Stomach is decompressed. Duodenum is normally
positioned as is the ligament of Treitz. No small bowel wall
thickening. No small bowel dilatation. The terminal ileum is normal.
The appendix is not well visualized, but there is no edema or
inflammation in the region of the cecum. Diffuse wall thickening
noted in the distal descending and sigmoid colon with diverticular
disease. No findings to suggest diverticulitis.

Vascular/Lymphatic: There is advanced atherosclerotic calcification
of the abdominal aorta without aneurysm. There is no gastrohepatic
or hepatoduodenal ligament lymphadenopathy. No retroperitoneal or
mesenteric lymphadenopathy. No pelvic sidewall lymphadenopathy.
Fem-fem bypass graft again noted. Lesion in the right groin
apparently incorporating the graft anastomosis measures 5.5 x 4.8 cm
today compared to 4.8 x 4.1 cm previously. Imaging features today
suggest hematoma or pseudoaneurysm

Reproductive: Calcified fibroid noted in the uterus. There is no
adnexal mass.

Other: Small volume intraperitoneal free fluid likely although
inferior pelvis some low-dose cured by beam hardening artifact from
right hip replacement.

Musculoskeletal: Right hip prosthesis again noted. Bones are
diffusely demineralized. Lumbar fusion hardware evident.
IMPRESSION: 1. Diffuse wall thickening in the distal descending and sigmoid
colon with diverticular disease. No findings to suggest
diverticulitis.
2. 5.5 x 4.8 cm lesion in the right groin apparently incorporating
the graft anastomosis. Imaging features today suggests hematoma or
pseudoaneurysm.
3. Small volume intraperitoneal free fluid.
4. Bibasilar collapse/consolidation, left greater than right. The
left lower lobe collapse seen previously has improved in the
interval.
5. Stable dilatation of the common bile duct.
6. Aortic Atherosclerosis ([id number]-FVA.A).

## 2022-11-04 IMAGING — DX DG CHEST 1V PORT
1 series · 1 of 1 positions shown · non-contrast
Comparison: 09/10/2021

CLINICAL DATA: Weakness

EXAM:
PORTABLE CHEST 1 VIEW

[chest ap]
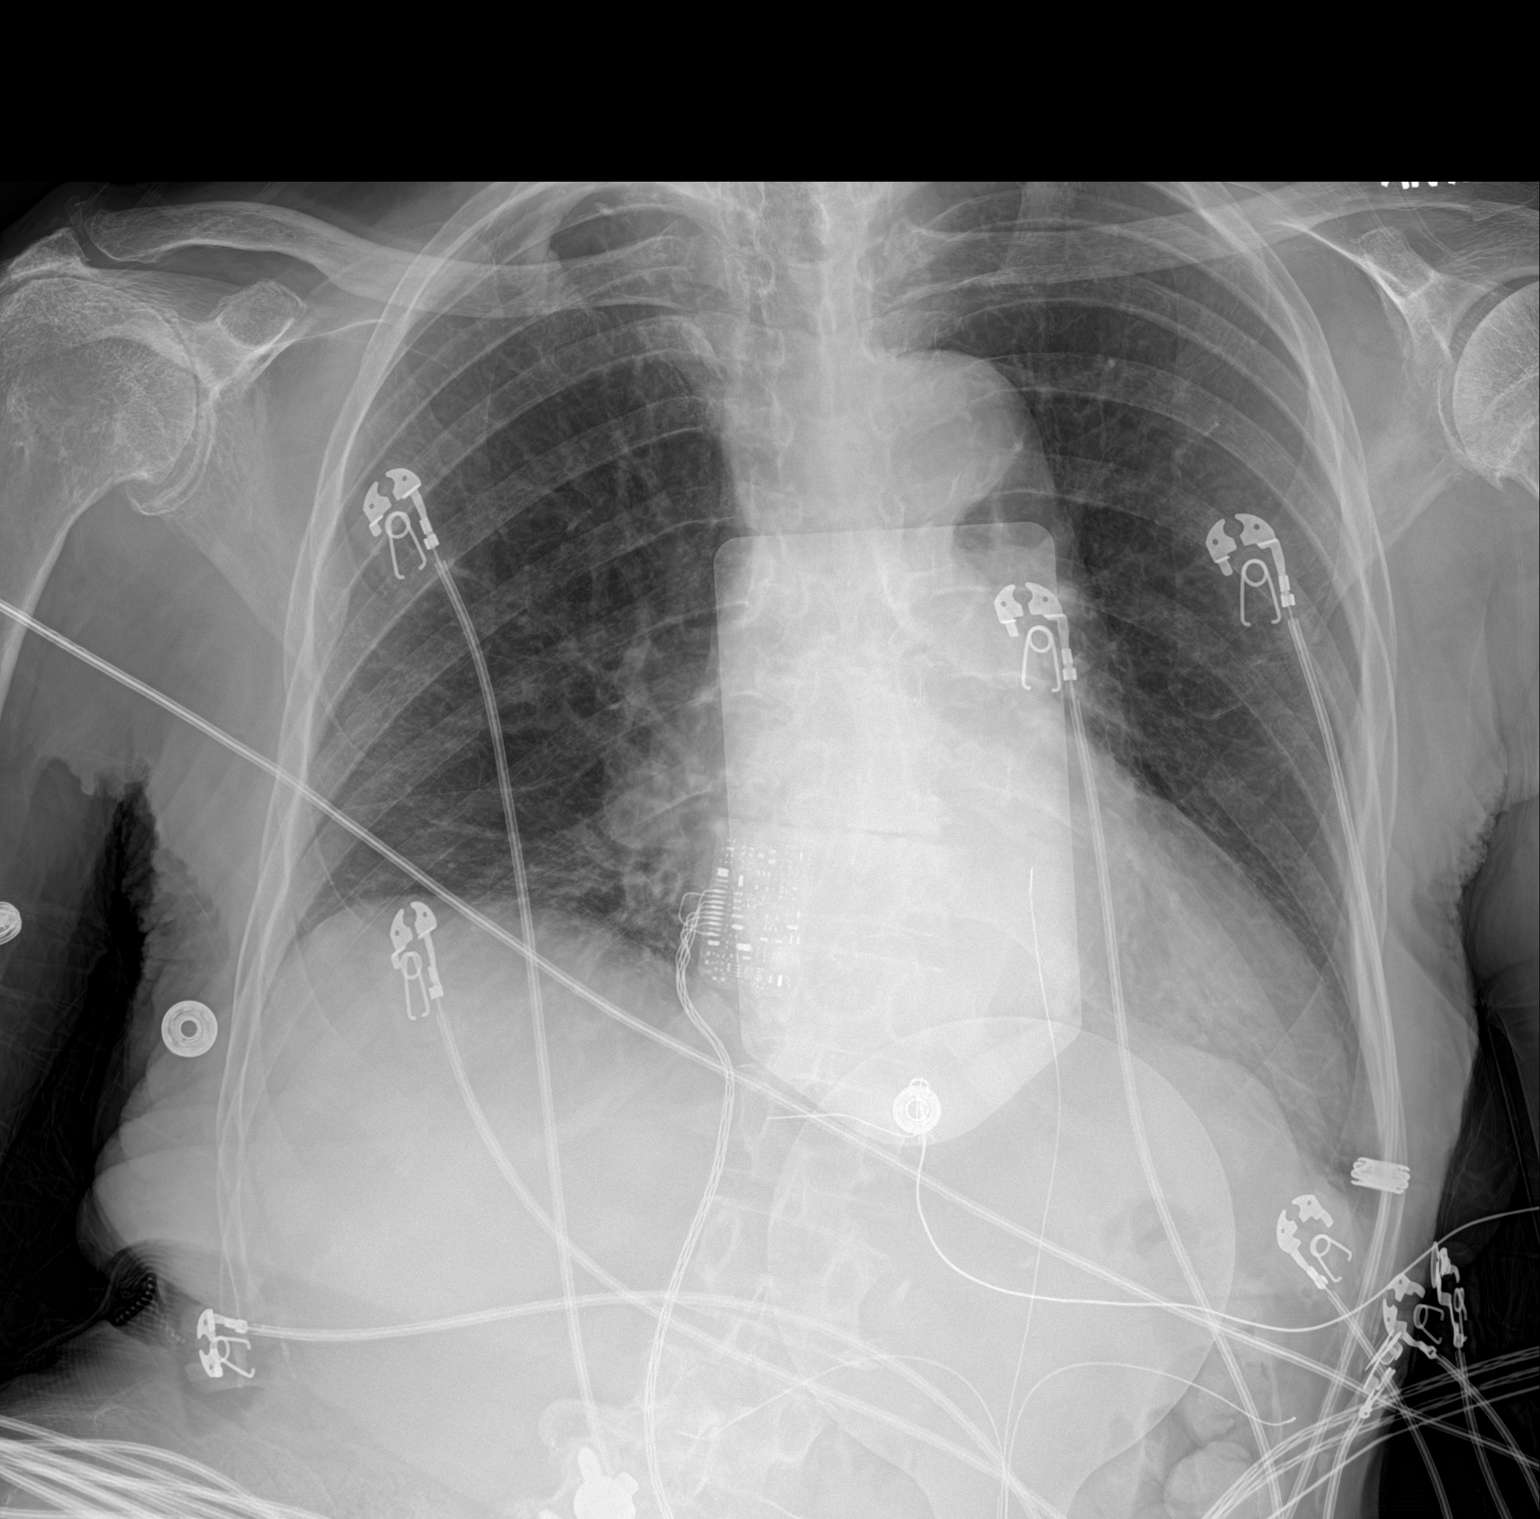

[1 of 1 positions shown; findings below may reference images not displayed]

FINDINGS: Stable cardiomediastinal contours. Probable mild atelectasis at the
right lung base. No pleural effusion.
IMPRESSION: Mild atelectasis right lung base.

## 2022-11-29 IMAGING — CT CT RENAL STONE PROTOCOL
2 of 4 series · 16 of 46 positions shown, 18 images · non-contrast
Comparison: CT of the abdomen pelvis dated 09/15/2021.

CLINICAL DATA: Flank pain.  Concern for kidney stone.

EXAM:
CT ABDOMEN AND PELVIS WITHOUT CONTRAST
TECHNIQUE: Multidetector CT imaging of the abdomen and pelvis was performed
following the standard protocol without IV contrast.

[Series 3: stone study 5.0 i30f 2 (person_name) · axial · 0.74mm/px · z∈[-504,-154]mm · 13 of 78 slices shown, 15 images]
[im 4/78  soft-tissue]
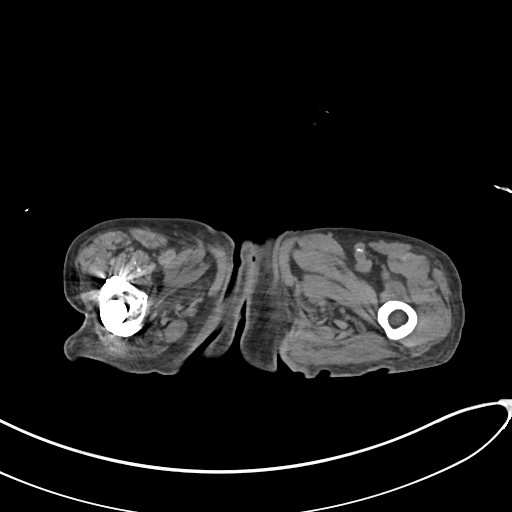
[im 4/78  bone]
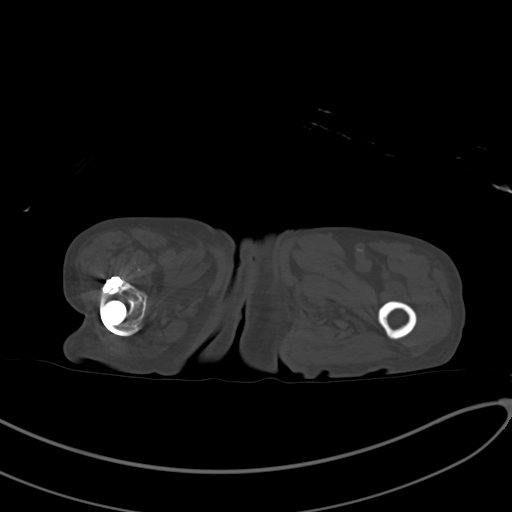
[im 10/78  soft-tissue]
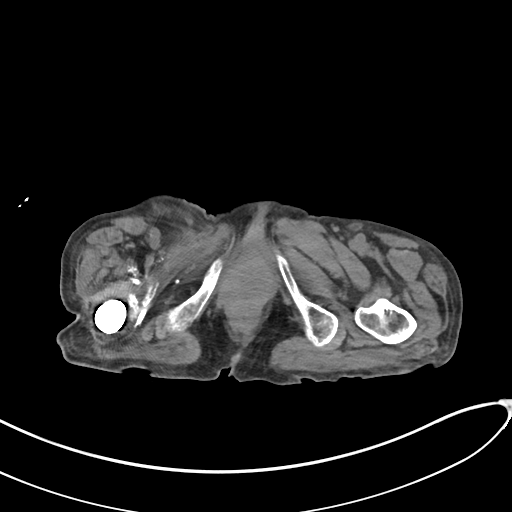
[im 17/78  soft-tissue]
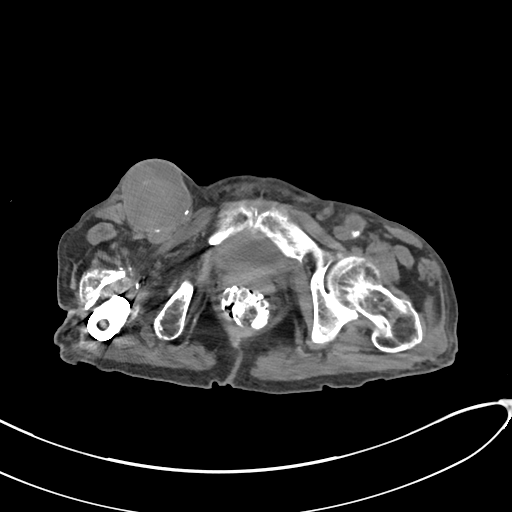
[im 23/78  soft-tissue]
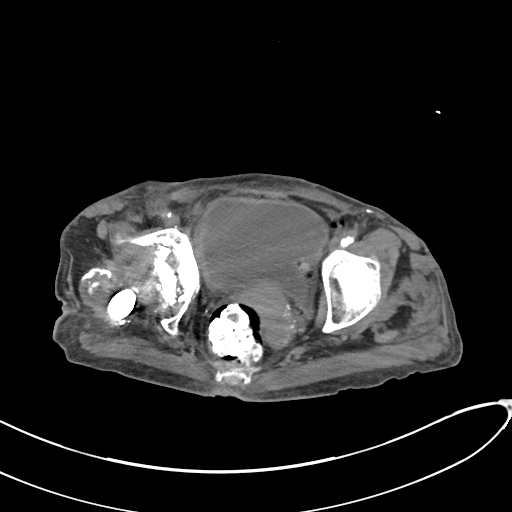
[im 26/78  soft-tissue]
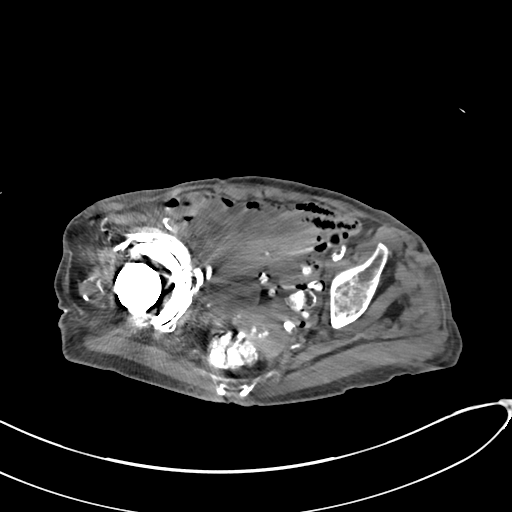
[im 33/78  soft-tissue]
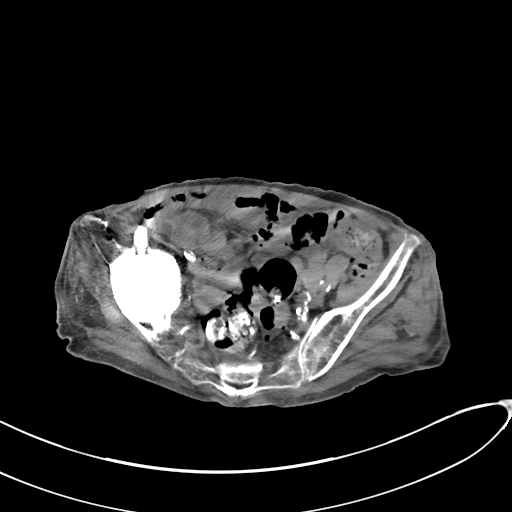
[im 39/78  soft-tissue]
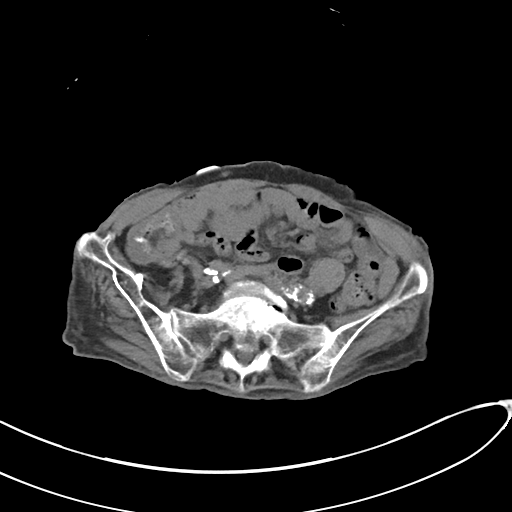
[im 45/78  soft-tissue]
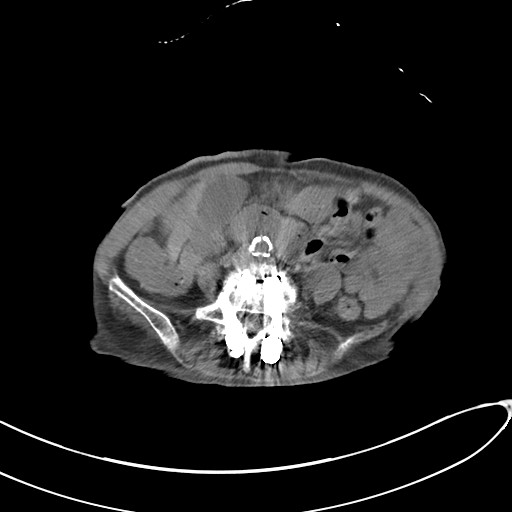
[im 52/78  soft-tissue]
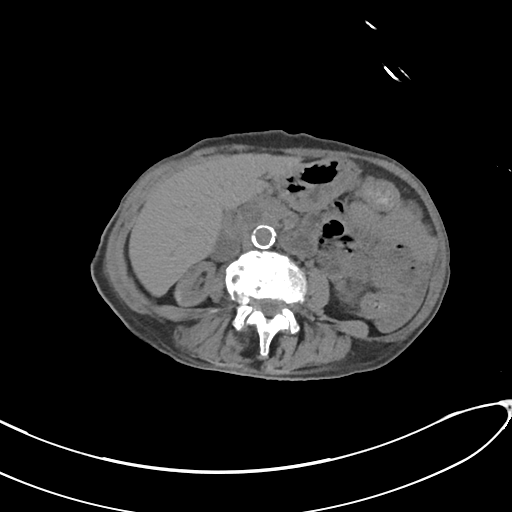
[im 52/78  bone]
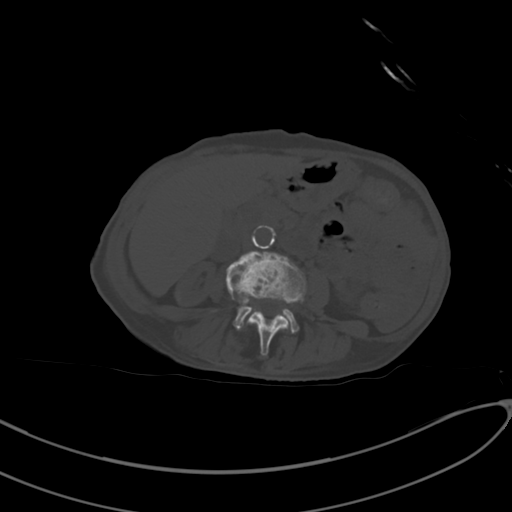
[im 55/78  soft-tissue]
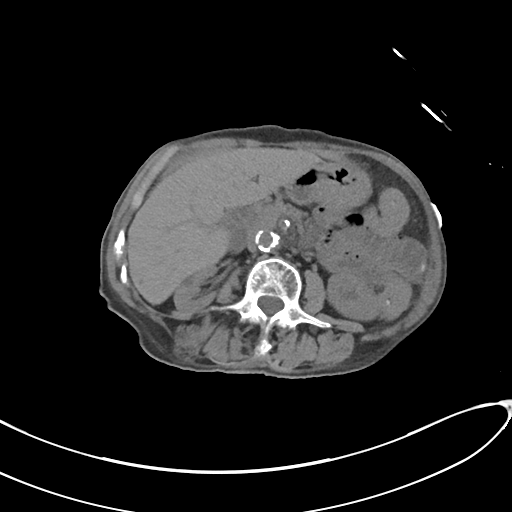
[im 61/78  soft-tissue]
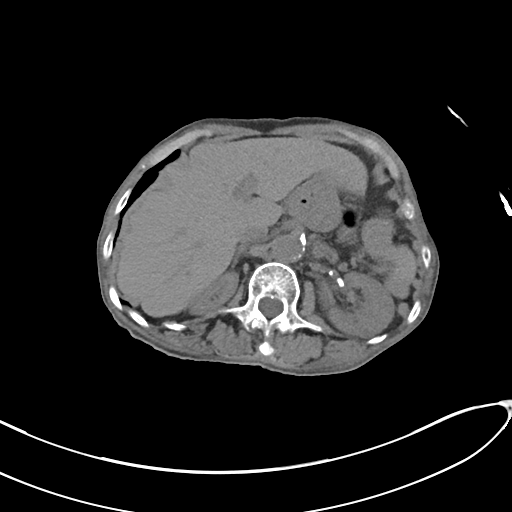
[im 68/78  soft-tissue]
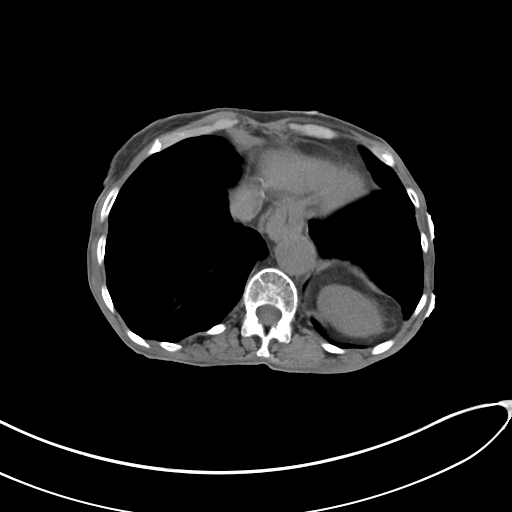
[im 74/78  soft-tissue]
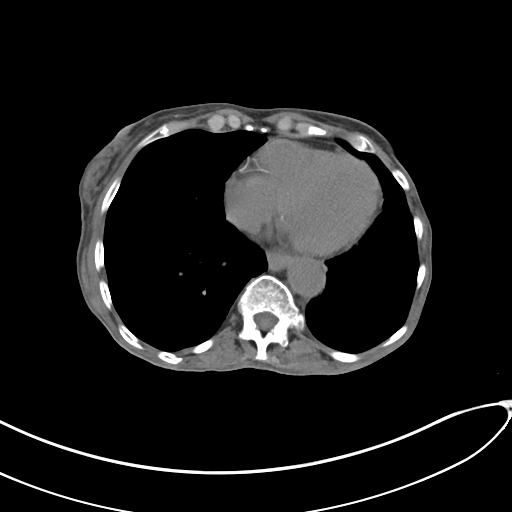

[Series 6: coronal soft tissue · coronal · 0.67mm/px · 3 of 79 slices shown]
[im 27/79  soft-tissue]
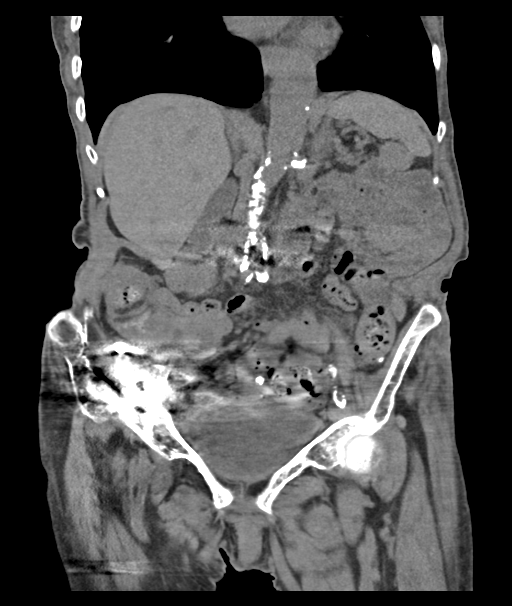
[im 35/79  soft-tissue]
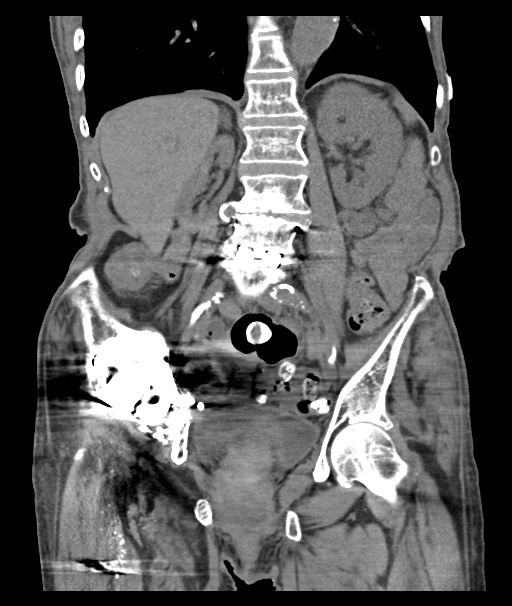
[im 44/79  soft-tissue]
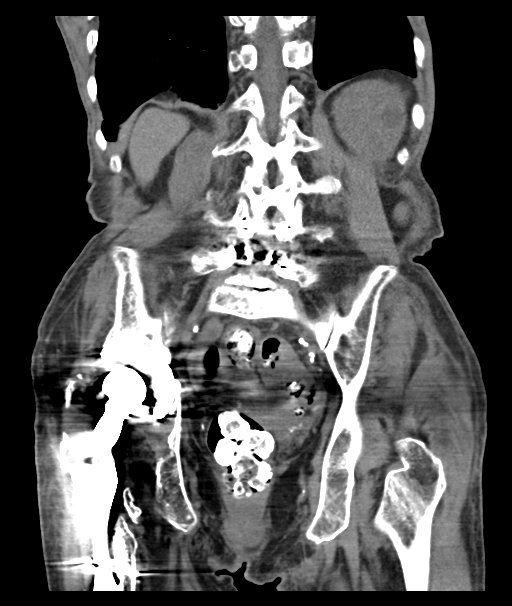

[16 of 46 positions shown; findings below may reference images not displayed]

FINDINGS: Evaluation of this exam is limited in the absence of intravenous
contrast. Evaluation is also limited due to streak artifact caused
by right hip arthroplasty and spinal hardware as well as respiratory
motion.

Lower chest: Minimal bibasilar atelectasis. There is coronary
vascular calcification.

No intra-abdominal free air or free fluid.

Hepatobiliary: No focal liver abnormality is seen. No gallstones,
gallbladder wall thickening, or biliary dilatation.

Pancreas: The pancreas is moderately atrophic. No active
inflammatory changes.

Spleen: Normal in size without focal abnormality.

Adrenals/Urinary Tract: The adrenal glands are grossly unremarkable.
Mild right renal parenchyma atrophy. A 6 mm calcific focus over the
left renal hilum, likely vascular calcification. No hydronephrosis
or nephrolithiasis. Small bilateral renal hypodense lesions are not
characterized on this CT. The visualized ureters and urinary bladder
appear unremarkable.

Stomach/Bowel: There is severe sigmoid diverticulosis. There is a
small hiatal hernia. No bowel obstruction. The appendix is normal.

Vascular/Lymphatic: Advanced aortoiliac atherosclerotic disease. The
IVC is grossly unremarkable. No portal venous gas. There is no
adenopathy.

Reproductive: The uterus is poorly visualized. Probable small
calcified uterine fibroid.

Other: Similar appearance of a 5.2 x 4.8 cm collection in the
superficial soft tissues of the right groin, likely a
pseudoaneurysm.

Musculoskeletal: Right hip arthroplasty. Osteopenia with
degenerative changes of the spine. Lower lumbar fixation hardware.
No acute osseous pathology.
IMPRESSION: 1. No acute intra-abdominal or pelvic pathology. No hydronephrosis
or nephrolithiasis.
2. Severe sigmoid diverticulosis. No bowel obstruction. Normal
appendix.
3. Aortic Atherosclerosis ([id number]).

## 2022-12-11 IMAGING — DX DG CHEST 1V PORT
1 series · 1 of 1 positions shown · non-contrast
Comparison: September 26, 2021

CLINICAL DATA: Questionable sepsis, shortness of breath.

EXAM:
PORTABLE CHEST 1 VIEW

[chest ap]
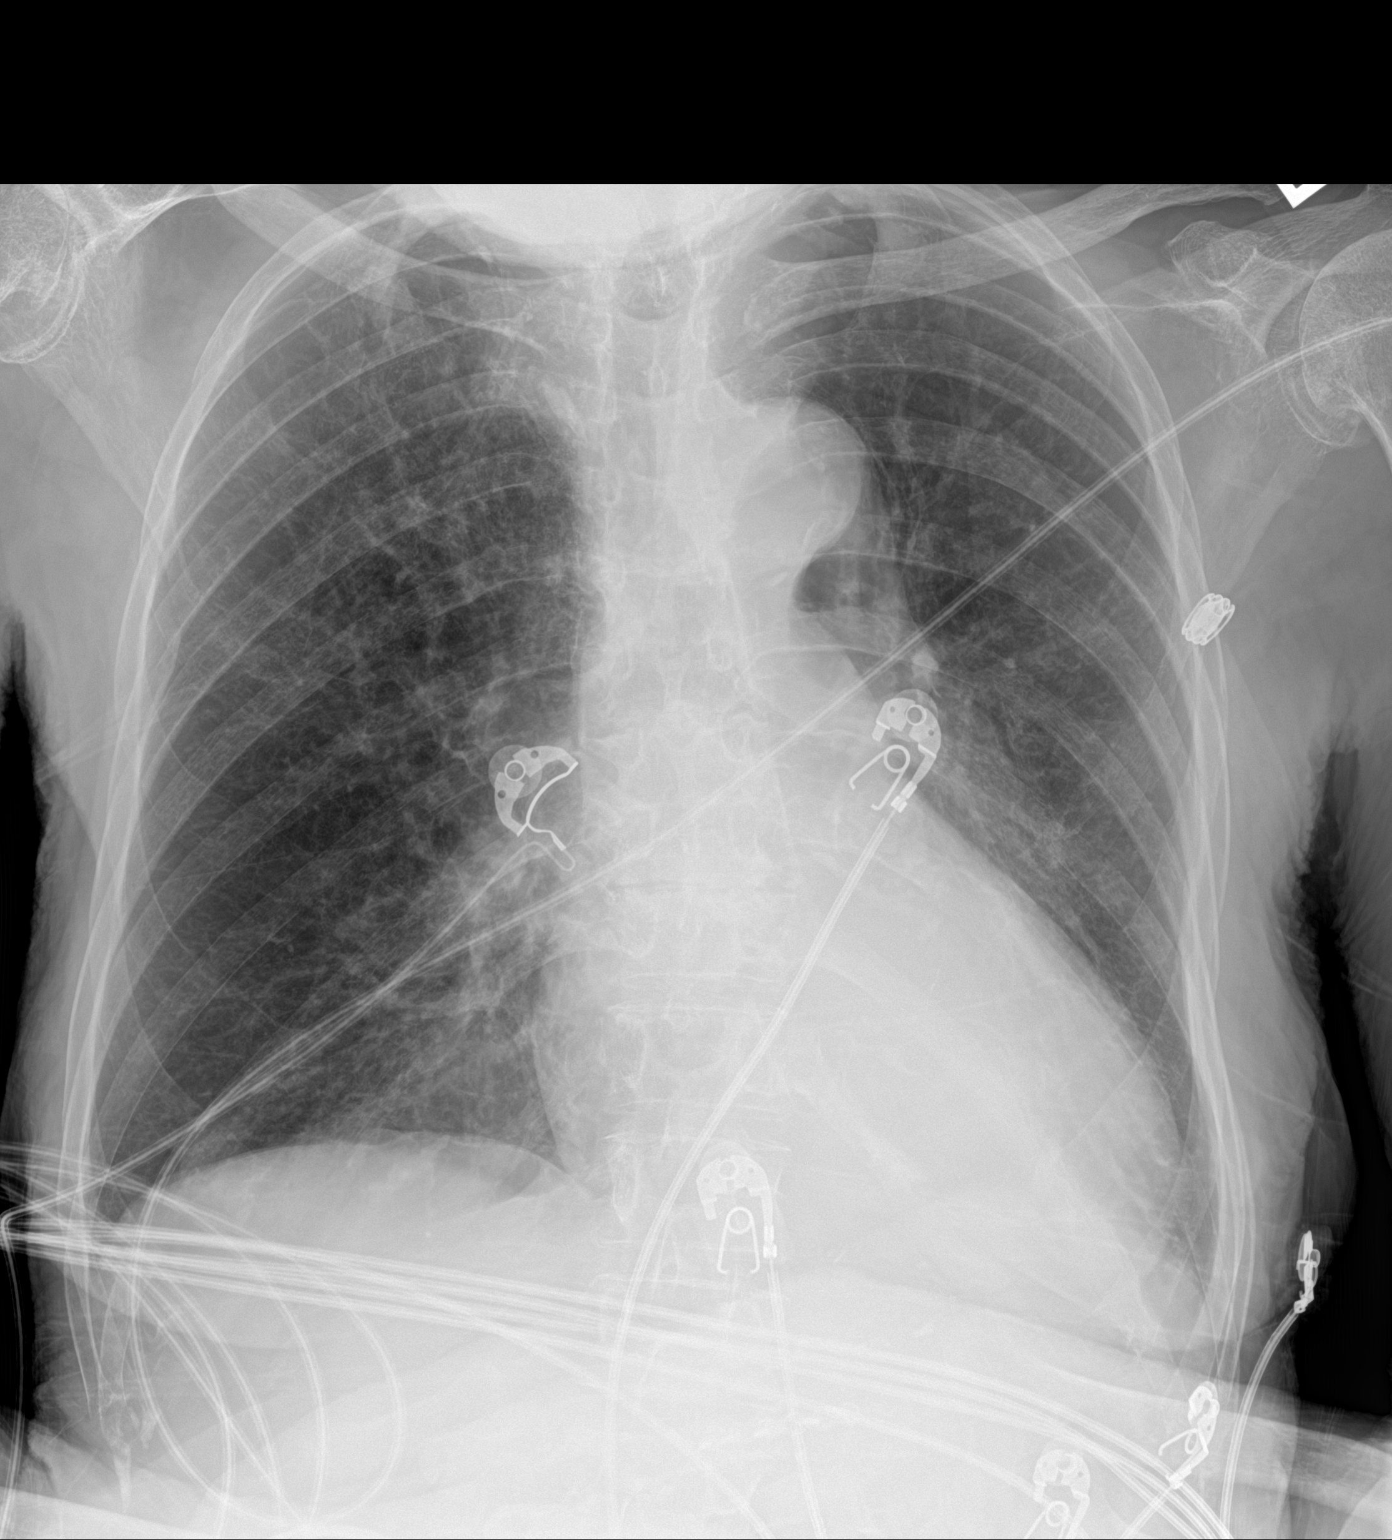

[1 of 1 positions shown; findings below may reference images not displayed]

FINDINGS: The heart size and mediastinal contours are stable. Mild atelectasis
of left lung base is identified. Both lungs are otherwise clear. The
visualized skeletal structures are unremarkable.
IMPRESSION: Mild atelectasis of left lung base.

## 2022-12-24 IMAGING — CT CT HEAD W/O CM
4 series · 15 of 47 positions shown, 17 images · non-contrast
Comparison: 08/07/2021.

CLINICAL DATA: HALUELM TOMSOM for code sepsis from [REDACTED]. At
baseline pt is A&Ox4 and can transfer from bed to w/c. Last night
pt became increasingly lethargic and not acting her norm, staff
called EMS today for fever, minimal verbal response and acute change
from baseline



[Series 3: head without · axial · non-contrast · 0.41mm/px · z∈[-22,+93]mm · 7 of 31 slices shown, 9 images]
[im 4/31  brain]
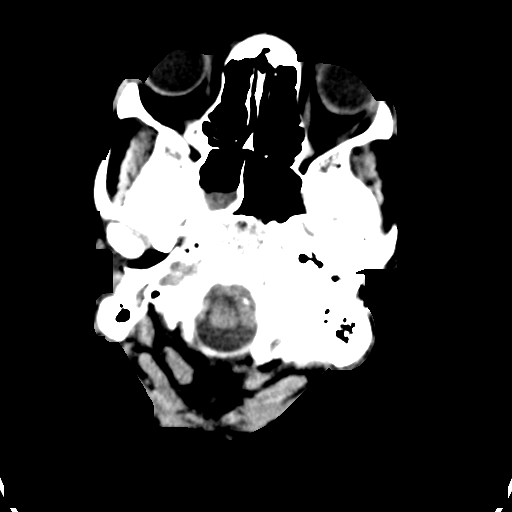
[im 4/31  bone]
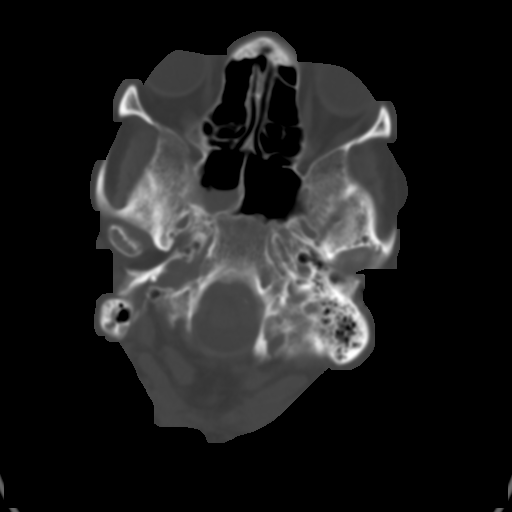
[im 8/31  brain]
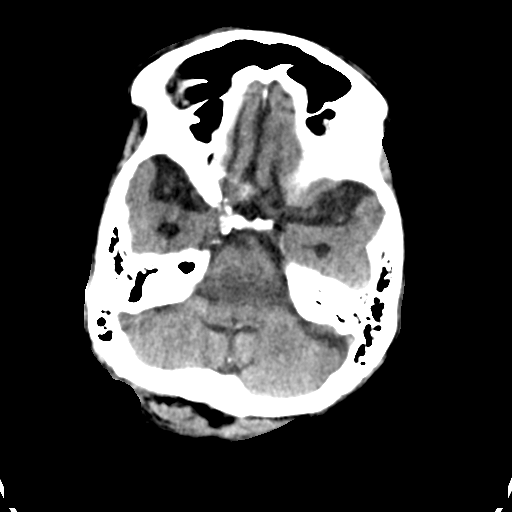
[im 12/31  brain]
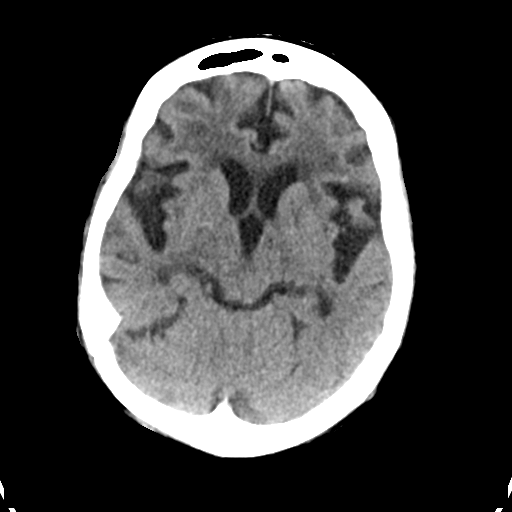
[im 16/31  brain]
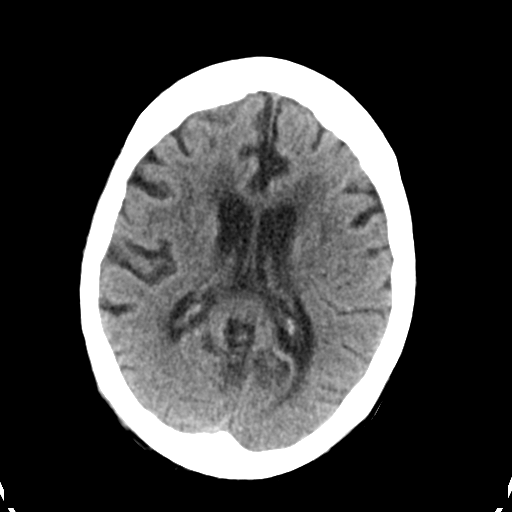
[im 19/31  brain]
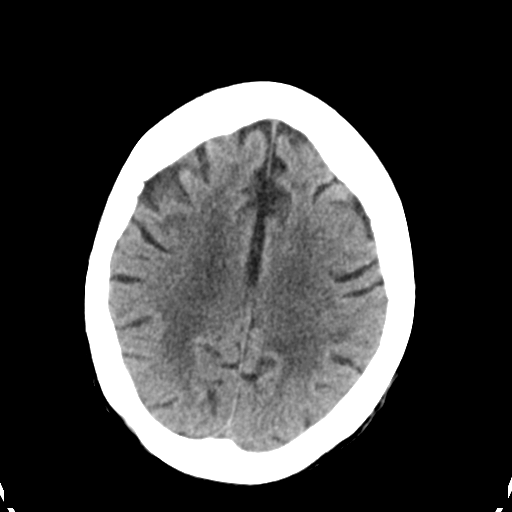
[im 19/31  bone]
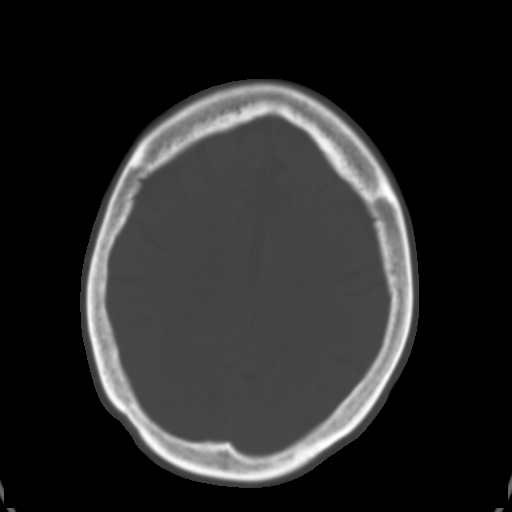
[im 23/31  brain]
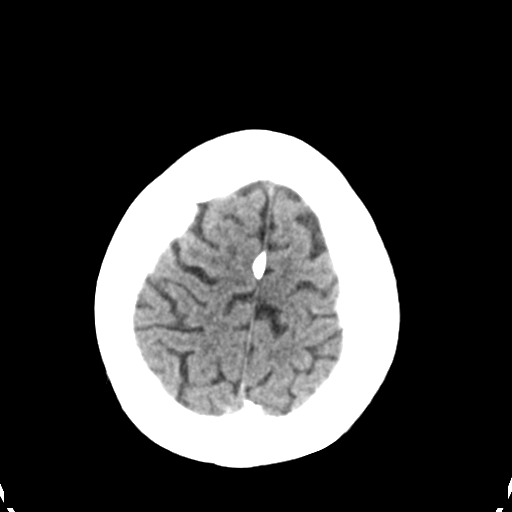
[im 27/31  brain]
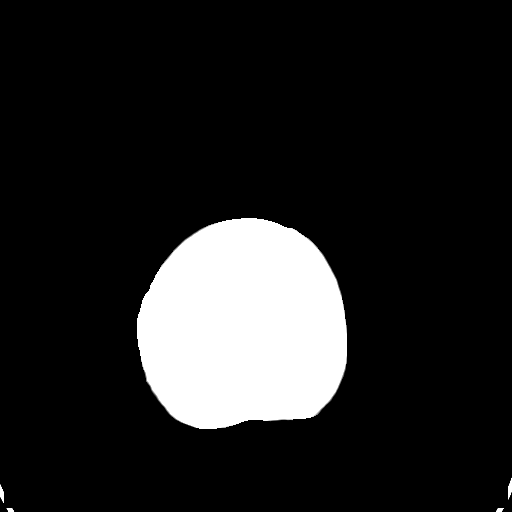

[Series 5: ax head bone · axial · 0.41mm/px · z∈[-4,+12]mm · 2 of 76 slices shown]
[im 8/76  bone]
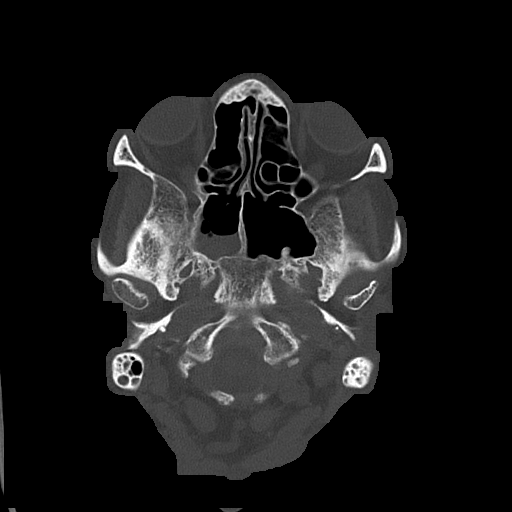
[im 16/76  bone]
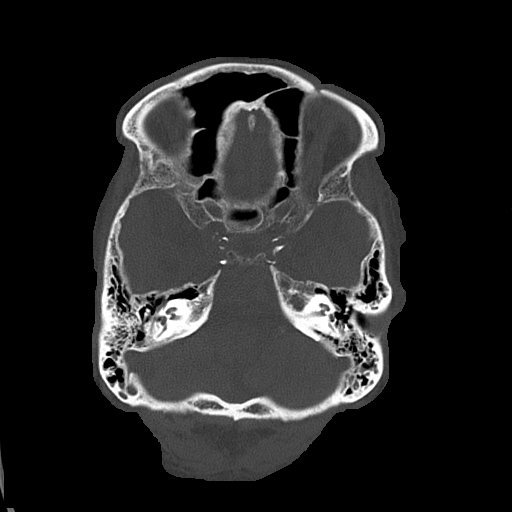

[Series 6: head without cor · coronal · non-contrast · 0.31mm/px · 3 of 62 slices shown]
[im 21/62  brain]
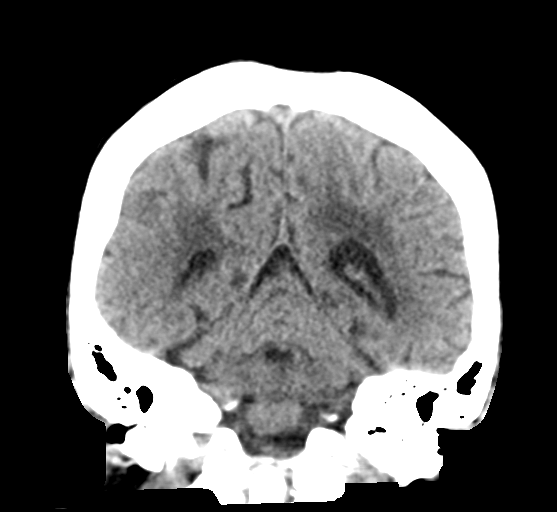
[im 28/62  brain]
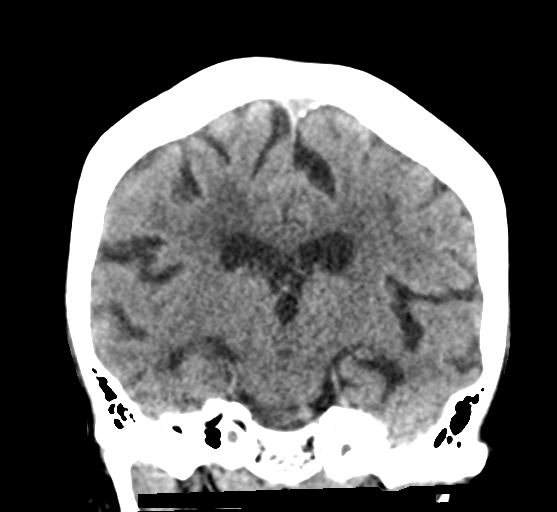
[im 34/62  brain]
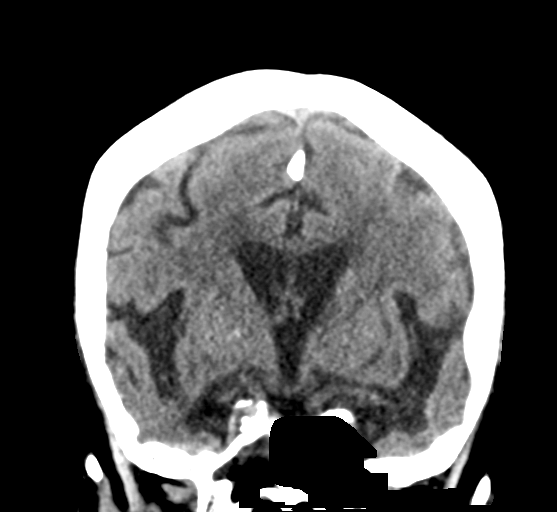

[Series 7: head without sag · sagittal · non-contrast · 0.29mm/px · 3 of 48 slices shown]
[im 16/48  brain]
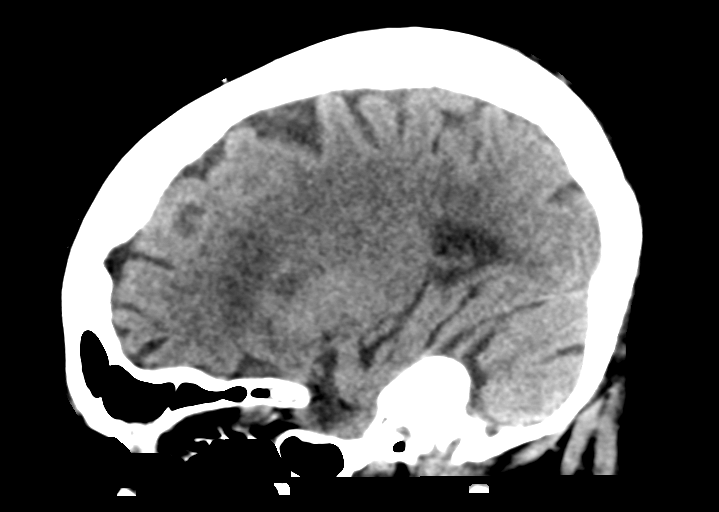
[im 24/48  brain]
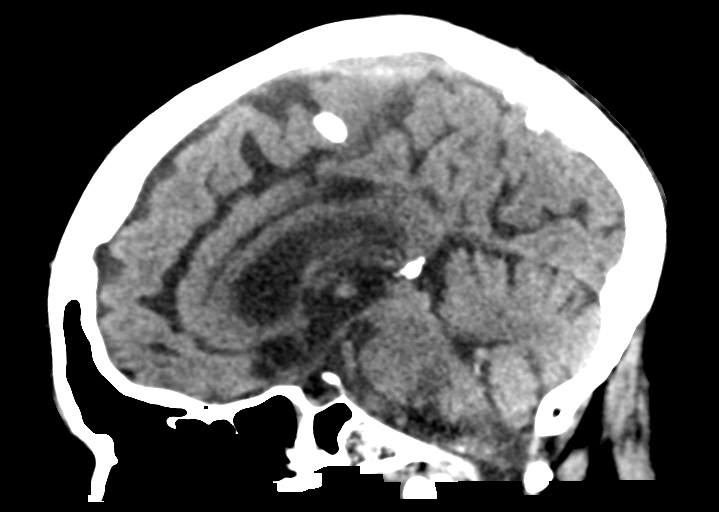
[im 32/48  brain]
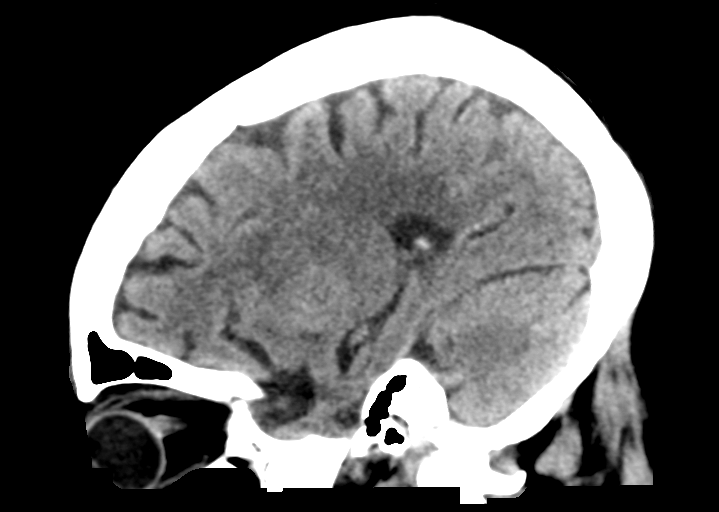

[15 of 47 positions shown; findings below may reference images not displayed]

FINDINGS: Brain: No evidence of acute infarction, hemorrhage, hydrocephalus,
extra-axial collection or mass lesion/mass effect.

There is ventricular sulcal enlargement reflecting mild diffuse
atrophy. Patchy white matter hypoattenuation is noted bilaterally
consistent with moderate chronic microvascular ischemic change.
These findings are stable.

Vascular: No hyperdense vessel or unexpected calcification.

Skull: Normal. Negative for fracture or focal lesion.

Sinuses/Orbits: Visualized globes and orbits are unremarkable.

There are findings of previous sinus surgery on the right with
ethmoidectomies and a medial antrectomy. Dependent fluid noted in
the right sphenoid sinus. Remaining sinuses are clear.

Other: None.
IMPRESSION: 1. No acute intracranial abnormalities.
2. Mild atrophy and moderate chronic microvascular ischemic change.
3. Fluid level in the right sphenoid sinus. Consider acute sinusitis
in the proper clinical setting.

## 2022-12-24 IMAGING — DX DG CHEST 1V PORT
1 series · 1 of 1 positions shown · non-contrast
Comparison: November 02, 2021.

CLINICAL DATA: Sepsis.

EXAM:
PORTABLE CHEST 1 VIEW

[chest]
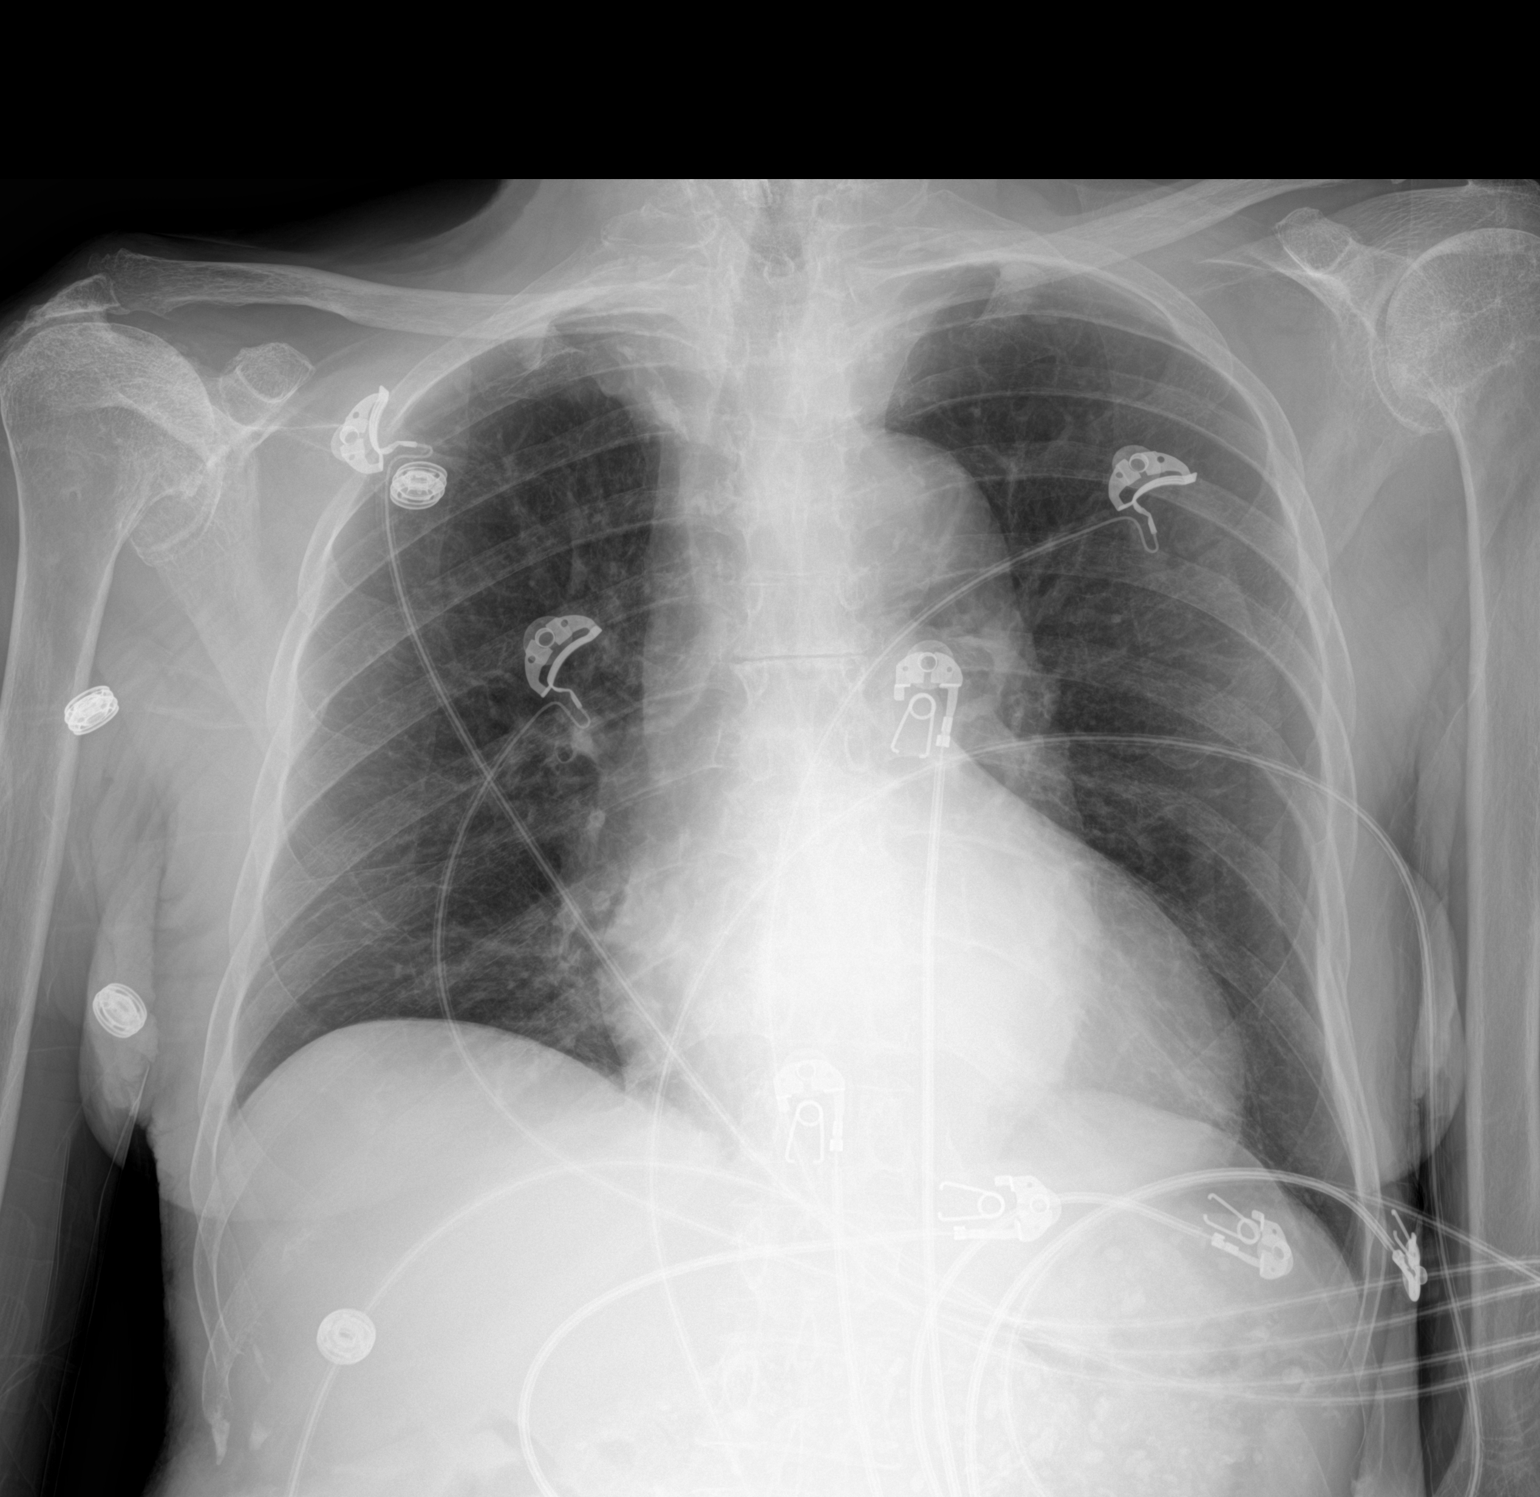

[1 of 1 positions shown; findings below may reference images not displayed]

FINDINGS: Stable cardiomediastinal silhouette. Both lungs are clear. The
visualized skeletal structures are unremarkable.
IMPRESSION: No active disease.
